# Patient Record
Sex: Male | Born: 1950
Health system: Southern US, Community
[De-identification: ages and names within clinical notes are randomized; demographics above are authoritative.]

## PROBLEM LIST (undated history)

## (undated) ENCOUNTER — Emergency Department (HOSPITAL_COMMUNITY): Admission: EM | Payer: Medicare HMO | Source: Home / Self Care

## (undated) DIAGNOSIS — R5381 Other malaise: Secondary | ICD-10-CM

## (undated) DIAGNOSIS — R269 Unspecified abnormalities of gait and mobility: Secondary | ICD-10-CM

## (undated) DIAGNOSIS — R5383 Other fatigue: Secondary | ICD-10-CM

## (undated) DIAGNOSIS — I5042 Chronic combined systolic (congestive) and diastolic (congestive) heart failure: Secondary | ICD-10-CM

## (undated) DIAGNOSIS — M5412 Radiculopathy, cervical region: Secondary | ICD-10-CM

## (undated) DIAGNOSIS — I471 Supraventricular tachycardia, unspecified: Secondary | ICD-10-CM

## (undated) DIAGNOSIS — N2581 Secondary hyperparathyroidism of renal origin: Secondary | ICD-10-CM

## (undated) DIAGNOSIS — G4730 Sleep apnea, unspecified: Secondary | ICD-10-CM

## (undated) DIAGNOSIS — E0590 Thyrotoxicosis, unspecified without thyrotoxic crisis or storm: Secondary | ICD-10-CM

## (undated) DIAGNOSIS — M79609 Pain in unspecified limb: Secondary | ICD-10-CM

## (undated) DIAGNOSIS — J984 Other disorders of lung: Secondary | ICD-10-CM

## (undated) DIAGNOSIS — Z86010 Personal history of colonic polyps: Secondary | ICD-10-CM

## (undated) DIAGNOSIS — I2510 Atherosclerotic heart disease of native coronary artery without angina pectoris: Secondary | ICD-10-CM

## (undated) DIAGNOSIS — K219 Gastro-esophageal reflux disease without esophagitis: Secondary | ICD-10-CM

## (undated) DIAGNOSIS — Z9289 Personal history of other medical treatment: Secondary | ICD-10-CM

## (undated) DIAGNOSIS — E042 Nontoxic multinodular goiter: Secondary | ICD-10-CM

## (undated) DIAGNOSIS — Z992 Dependence on renal dialysis: Secondary | ICD-10-CM

## (undated) DIAGNOSIS — N186 End stage renal disease: Secondary | ICD-10-CM

## (undated) DIAGNOSIS — I10 Essential (primary) hypertension: Secondary | ICD-10-CM

## (undated) DIAGNOSIS — F0390 Unspecified dementia without behavioral disturbance: Secondary | ICD-10-CM

## (undated) DIAGNOSIS — A088 Other specified intestinal infections: Secondary | ICD-10-CM

## (undated) DIAGNOSIS — M109 Gout, unspecified: Secondary | ICD-10-CM

## (undated) DIAGNOSIS — E785 Hyperlipidemia, unspecified: Secondary | ICD-10-CM

## (undated) DIAGNOSIS — R740 Nonspecific elevation of levels of transaminase and lactic acid dehydrogenase [LDH]: Secondary | ICD-10-CM

## (undated) DIAGNOSIS — B351 Tinea unguium: Secondary | ICD-10-CM

## (undated) DIAGNOSIS — N400 Enlarged prostate without lower urinary tract symptoms: Secondary | ICD-10-CM

## (undated) DIAGNOSIS — G609 Hereditary and idiopathic neuropathy, unspecified: Secondary | ICD-10-CM

## (undated) DIAGNOSIS — F329 Major depressive disorder, single episode, unspecified: Secondary | ICD-10-CM

## (undated) DIAGNOSIS — C61 Malignant neoplasm of prostate: Secondary | ICD-10-CM

## (undated) DIAGNOSIS — R079 Chest pain, unspecified: Secondary | ICD-10-CM

## (undated) DIAGNOSIS — E119 Type 2 diabetes mellitus without complications: Secondary | ICD-10-CM

## (undated) DIAGNOSIS — M48062 Spinal stenosis, lumbar region with neurogenic claudication: Secondary | ICD-10-CM

## (undated) DIAGNOSIS — I255 Ischemic cardiomyopathy: Secondary | ICD-10-CM

## (undated) DIAGNOSIS — G9340 Encephalopathy, unspecified: Secondary | ICD-10-CM

## (undated) DIAGNOSIS — N62 Hypertrophy of breast: Secondary | ICD-10-CM

## (undated) DIAGNOSIS — G4733 Obstructive sleep apnea (adult) (pediatric): Secondary | ICD-10-CM

## (undated) DIAGNOSIS — D649 Anemia, unspecified: Secondary | ICD-10-CM

## (undated) DIAGNOSIS — Z9989 Dependence on other enabling machines and devices: Secondary | ICD-10-CM

## (undated) DIAGNOSIS — J309 Allergic rhinitis, unspecified: Secondary | ICD-10-CM

## (undated) DIAGNOSIS — I959 Hypotension, unspecified: Secondary | ICD-10-CM

## (undated) HISTORY — DX: Dependence on other enabling machines and devices: Z99.89

## (undated) HISTORY — DX: Unspecified abnormalities of gait and mobility: R26.9

## (undated) HISTORY — DX: Major depressive disorder, single episode, unspecified: F32.9

## (undated) HISTORY — DX: Gastro-esophageal reflux disease without esophagitis: K21.9

## (undated) HISTORY — DX: Other fatigue: R53.83

## (undated) HISTORY — DX: Essential (primary) hypertension: I10

## (undated) HISTORY — DX: Other specified intestinal infections: A08.8

## (undated) HISTORY — DX: Benign prostatic hyperplasia without lower urinary tract symptoms: N40.0

## (undated) HISTORY — DX: Hypotension, unspecified: I95.9

## (undated) HISTORY — DX: Hereditary and idiopathic neuropathy, unspecified: G60.9

## (undated) HISTORY — DX: Tinea unguium: B35.1

## (undated) HISTORY — DX: Obstructive sleep apnea (adult) (pediatric): G47.33

## (undated) HISTORY — DX: Pain in unspecified limb: M79.609

## (undated) HISTORY — DX: Hypocalcemia: E83.51

## (undated) HISTORY — DX: Ischemic cardiomyopathy: I25.5

## (undated) HISTORY — DX: Nontoxic multinodular goiter: E04.2

## (undated) HISTORY — DX: Other malaise: R53.81

## (undated) HISTORY — DX: Radiculopathy, cervical region: M54.12

## (undated) HISTORY — DX: Hyperlipidemia, unspecified: E78.5

## (undated) HISTORY — DX: Hypertrophy of breast: N62

## (undated) HISTORY — DX: Allergic rhinitis, unspecified: J30.9

## (undated) HISTORY — DX: Type 2 diabetes mellitus without complications: E11.9

## (undated) HISTORY — DX: Gout, unspecified: M10.9

## (undated) HISTORY — PX: TONSILLECTOMY: SUR1361

## (undated) HISTORY — DX: Secondary hyperparathyroidism of renal origin: N25.81

## (undated) HISTORY — PX: CHOLECYSTECTOMY: SHX55

## (undated) HISTORY — DX: Nonspecific elevation of levels of transaminase and lactic acid dehydrogenase (ldh): R74.0

## (undated) HISTORY — DX: Atherosclerotic heart disease of native coronary artery without angina pectoris: I25.10

## (undated) HISTORY — DX: Anemia, unspecified: D64.9

## (undated) HISTORY — DX: Other disorders of lung: J98.4

## (undated) HISTORY — DX: Personal history of colonic polyps: Z86.010

## (undated) SURGERY — Surgical Case
Anesthesia: *Unknown

---

## 1996-10-01 HISTORY — PX: BACK SURGERY: SHX140

## 1997-12-30 ENCOUNTER — Other Ambulatory Visit: Admission: RE | Admit: 1997-12-30 | Discharge: 1997-12-30 | Payer: Self-pay | Admitting: Nephrology

## 1998-01-21 ENCOUNTER — Ambulatory Visit (HOSPITAL_COMMUNITY): Admission: RE | Admit: 1998-01-21 | Discharge: 1998-01-21 | Payer: Self-pay | Admitting: Neurosurgery

## 1998-03-22 ENCOUNTER — Ambulatory Visit (HOSPITAL_COMMUNITY): Admission: RE | Admit: 1998-03-22 | Discharge: 1998-03-22 | Payer: Self-pay | Admitting: Neurosurgery

## 1999-06-28 ENCOUNTER — Encounter: Payer: Self-pay | Admitting: Nephrology

## 1999-06-28 ENCOUNTER — Observation Stay (HOSPITAL_COMMUNITY): Admission: RE | Admit: 1999-06-28 | Discharge: 1999-06-29 | Payer: Self-pay | Admitting: Nephrology

## 1999-08-11 ENCOUNTER — Encounter: Payer: Self-pay | Admitting: Emergency Medicine

## 1999-08-11 ENCOUNTER — Emergency Department (HOSPITAL_COMMUNITY): Admission: EM | Admit: 1999-08-11 | Discharge: 1999-08-11 | Payer: Self-pay | Admitting: Emergency Medicine

## 2001-04-10 ENCOUNTER — Encounter: Admission: RE | Admit: 2001-04-10 | Discharge: 2001-04-10 | Payer: Self-pay | Admitting: Nephrology

## 2001-04-10 ENCOUNTER — Encounter: Payer: Self-pay | Admitting: Nephrology

## 2001-05-06 ENCOUNTER — Encounter: Admission: RE | Admit: 2001-05-06 | Discharge: 2001-05-06 | Payer: Self-pay | Admitting: Neurosurgery

## 2001-05-06 ENCOUNTER — Encounter: Payer: Self-pay | Admitting: Neurosurgery

## 2001-05-20 ENCOUNTER — Encounter: Payer: Self-pay | Admitting: Neurosurgery

## 2001-05-20 ENCOUNTER — Encounter: Admission: RE | Admit: 2001-05-20 | Discharge: 2001-05-20 | Payer: Self-pay | Admitting: Neurosurgery

## 2001-06-03 ENCOUNTER — Encounter: Payer: Self-pay | Admitting: Neurosurgery

## 2001-06-03 ENCOUNTER — Encounter: Admission: RE | Admit: 2001-06-03 | Discharge: 2001-06-03 | Payer: Self-pay | Admitting: Neurosurgery

## 2002-05-07 ENCOUNTER — Encounter: Payer: Self-pay | Admitting: Nephrology

## 2002-05-07 ENCOUNTER — Ambulatory Visit (HOSPITAL_COMMUNITY): Admission: RE | Admit: 2002-05-07 | Discharge: 2002-05-07 | Payer: Self-pay | Admitting: Nephrology

## 2002-08-11 ENCOUNTER — Encounter: Payer: Self-pay | Admitting: Neurosurgery

## 2002-08-11 ENCOUNTER — Encounter: Admission: RE | Admit: 2002-08-11 | Discharge: 2002-08-11 | Payer: Self-pay | Admitting: Neurosurgery

## 2002-08-25 ENCOUNTER — Encounter: Payer: Self-pay | Admitting: Neurosurgery

## 2002-08-25 ENCOUNTER — Encounter: Admission: RE | Admit: 2002-08-25 | Discharge: 2002-08-25 | Payer: Self-pay | Admitting: Neurosurgery

## 2002-09-08 ENCOUNTER — Encounter: Admission: RE | Admit: 2002-09-08 | Discharge: 2002-09-08 | Payer: Self-pay | Admitting: Neurosurgery

## 2002-09-08 ENCOUNTER — Encounter: Payer: Self-pay | Admitting: Neurosurgery

## 2002-10-02 ENCOUNTER — Encounter: Admission: RE | Admit: 2002-10-02 | Discharge: 2002-10-02 | Payer: Self-pay | Admitting: Neurosurgery

## 2002-10-02 ENCOUNTER — Encounter: Payer: Self-pay | Admitting: Neurosurgery

## 2002-11-11 ENCOUNTER — Ambulatory Visit (HOSPITAL_COMMUNITY): Admission: RE | Admit: 2002-11-11 | Discharge: 2002-11-11 | Payer: Self-pay | Admitting: Cardiology

## 2002-11-11 ENCOUNTER — Encounter: Payer: Self-pay | Admitting: Cardiology

## 2003-05-13 ENCOUNTER — Encounter: Admission: RE | Admit: 2003-05-13 | Discharge: 2003-05-13 | Payer: Self-pay | Admitting: Neurosurgery

## 2003-05-13 ENCOUNTER — Encounter: Payer: Self-pay | Admitting: Neurosurgery

## 2003-09-01 HISTORY — PX: FOREIGN BODY REMOVAL: SHX962

## 2003-09-03 ENCOUNTER — Emergency Department (HOSPITAL_COMMUNITY): Admission: EM | Admit: 2003-09-03 | Discharge: 2003-09-04 | Payer: Self-pay

## 2003-10-15 ENCOUNTER — Ambulatory Visit (HOSPITAL_COMMUNITY): Admission: RE | Admit: 2003-10-15 | Discharge: 2003-10-15 | Payer: Self-pay | Admitting: Gastroenterology

## 2003-10-15 ENCOUNTER — Encounter (INDEPENDENT_AMBULATORY_CARE_PROVIDER_SITE_OTHER): Payer: Self-pay | Admitting: *Deleted

## 2004-02-22 ENCOUNTER — Emergency Department (HOSPITAL_COMMUNITY): Admission: EM | Admit: 2004-02-22 | Discharge: 2004-02-22 | Payer: Self-pay | Admitting: Emergency Medicine

## 2004-03-03 IMAGING — CT CT RECONSTRUCTION
1 of 9 series · 6 of 33 positions shown, 8 images · IV contrast (omnipaque)
Comparison: none

FINDINGS
CLINICAL DATA: BACK PAIN.  THE PATIENT IS REFERRED FOR A LUMBAR MYELOGRAM.
LUMBAR MYELOGRAM
FOLLOWING INFORMED CONSENT, USING STERILE TECHNIQUE AND LIDOCAINE FOR LOCAL ANESTHESIA, I WAS ABLE
TO DIRECT A 22 GAUGE SPINAL NEEDLE INTO THE THECAL SAC CENTRALLY AT THE L2 LEVEL.  CSF IS CLEAR.
15 CC OMNIPAQUE 180 CONTRAST WAS INJECTED.  THE NEEDLE WAS REMOVED.  MULTIPLE IMAGES WERE OBTAINED
AND SUPPLEMENTED WITH STANDING FLEXION AND EXTENSION VIEWS.
THERE IS CLUMPING OF THE NERVE ROOTS AT THE L1 LEVEL WITH MARKED STENOSIS AT L1-2.  THERE IS
MINIMAL CONTRAST WHICH EXTENDS CAUDAL TO THE L1-2 LEVEL.  WITH SUBSEQUENT MANIPULATION, WE WERE
ABLE TO EXTEND THE CONTRAST CAUDALLY TO THE L5-S1 LEVEL.  MINIMAL CONTRAST, HOWEVER, IS SEEN AT THE
L2-3, L3-4, AND L4-5 LEVELS.  THREADED INTERBODY CAGES ARE NOTED AT L3-4.  PEDICLE SCREW FIXATION
OF L4-5 IS NOTED.  THIS IS STABLE THROUGH FLEXION AND EXTENSION.
IMPRESSION
SEVERE SPINAL STENOSIS AT THE L1-2 LEVEL.  THE FUSION IS STABLE THROUGH FLEXION AND EXTENSION.  CT
TO FOLLOW.
CT LUMBAR SPINE POST INTRATHECAL CONTRAST INJECTION
AXIAL IMAGING WAS DIRECTED THROUGH THE DISK LEVELS L1-2 INFERIORLY TO L5-S1.
THERE IS MINIMAL INTRATHECAL CONTRAST VISUALIZED.  CONTRAST DOES EXTEND CAUDAL VISUALIZED AT THE L4-
5 AND L5-S1 LEVELS.
L1-2:  THERE IS A CENTRAL VACUUM DISK WITH DIFFUSE CIRCUMFERENTIAL DISK BULGE.  THIS IN CONJUNCTION
WITH FACET HYPERTROPHY AND CONGENITALLY SHORT PEDICLES RESULTS IN SEVERE CENTRAL STENOSIS.  THE
STENOSIS IS FURTHER COMPOUNDED BY MARKED EPIDURAL FAT VENTRALLY.  THIS IS SEEN ON THE OUTSIDE MR
WITH A TREFOIL CONFIGURATION OF THE THECAL SAC WITHIN THE UPPER LUMBAR SPINE.
L2-3:  VACUUM DISK WITH DIFFUSE CIRCUMFERENTIAL DISK BULGE.  EXTENSIVE VENTRAL EPIDURAL FAT IS
IDENTIFIED RESULTING IN MARKED MASS EFFECT UPON THE THECAL SAC.   ASSOCIATED FACET OSTEOARTHRITIC
CHANGES ARE SEEN BILATERALLY.
L3-4:  THREADED INTERBODY CAGES ARE WELL-POSITIONED.  THERE IS EXTENSIVE STREAK ARTIFACT, HOWEVER,
REMAINS DIAGNOSTIC.  POSTERIOR LAMINECTOMY DEFECTS ARE NOTED WHICH DECOMPRESS THE CENTRAL CANAL.
NO FORAMINAL ENCROACHMENT IS IDENTIFIED.
L4-5:  THE PEDICLE SCREWS ARE WELL-POSITIONED.  THERE IS NO OSSEOUS FORAMINAL ENCROACHMENT.
POSTERIORLY, THE CENTRAL CANAL IS DECOMPRESSED.  NO APPRECIABLE CENTRAL STENOSIS.
L5-S1:  THE PEDICLE SCREWS ARE WELL-POSITIONED.  DIFFUSE CIRCUMFERENTIAL DISK BULGE.  THERE IS
MULTIFACTORIAL FORAMINAL NARROWING BILATERALLY ASSOCIATED WITH A DISK BULGE COMPOUNDED BY SUPERIOR
FACET BONY OVERGROWTH.  THE RIGHT L5 DORSAL ROOT GANGLIA EXITS WITHOUT ENCROACHMENT.  THERE IS
OBSCURATION OF THE FAT ABOUT THE LEFT L5 DORSAL ROOT GANGLION.  ADDITIONALLY, THERE IS SEVERE LEFT
SUBARTICULAR LATERAL RECESS STENOSIS SECONDARY TO SUPERIOR FACET BONY OVERGROWTH.
1)   THE PATIENT HAS SEVERE CENTRAL STENOSIS AT THE L1-2, L2-3 LEVELS.  THIS IS MULTIFACTORIAL
ASSOCIATED WITH EXTENSIVE VENTRAL EPIDURAL FAT WHICH IS COMPOUNDED BY DIFFUSE CIRCUMFERENTIAL DISK
BULGE AND FACET HYPERTROPHY IN A SETTING OF CONGENITALLY SHORTENED PEDICLES.
2)   PEDICLE SCREWS AND INTERBODY CAGES ARE WELL-POSITIONED.
3)    FORAMINAL NARROWING IS NOTED ON THE LEFT AT L5-S1 MULTIFACTORIAL IN ETIOLOGY WITH ASSOCIATED
SEVERE LEFT SUBARTICULAR LATERAL RECESS STENOSIS.
CT MULTIPLANAR REFORMATTED IMAGING
FUSION AT L3-4, L4-5 INTACT.  PEDICLE SCREWS ARE WELL-POSITIONED.  SEVERE CENTRAL STENOSIS IS
APPRECIATED AT L1-2 WHICH IS MULTIFACTORIAL IN ETIOLOGY.

[Series 102: l-spine helical · axial · 0.27mm/px · z∈[-347,-190]mm · 6 of 368 slices shown, 8 images]
[im 53/368  soft-tissue]
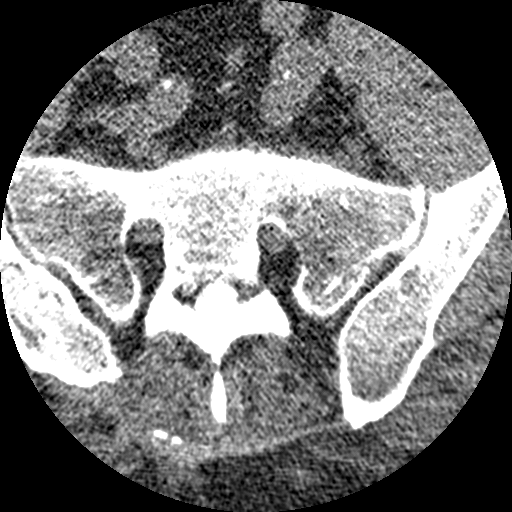
[im 53/368  bone]
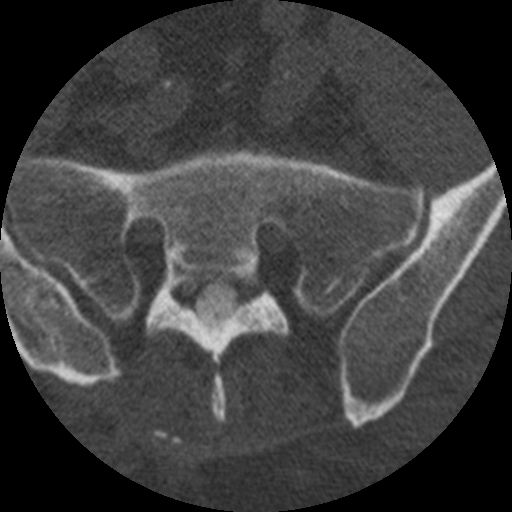
[im 105/368  bone]
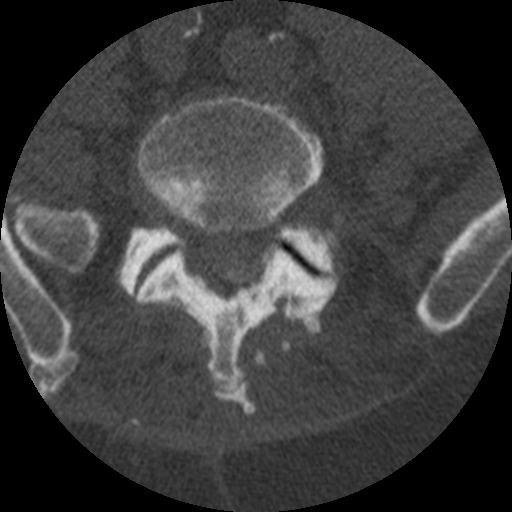
[im 158/368  bone]
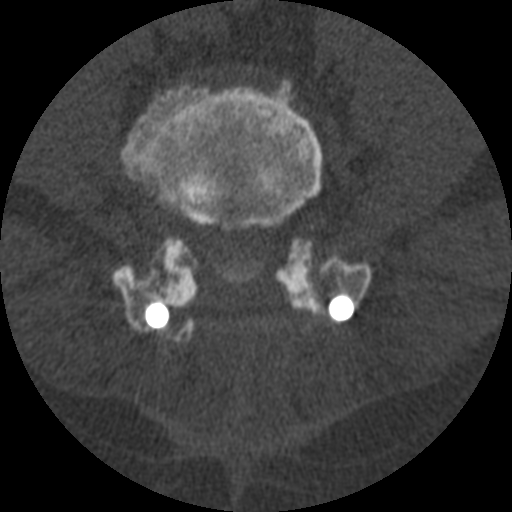
[im 210/368  bone]
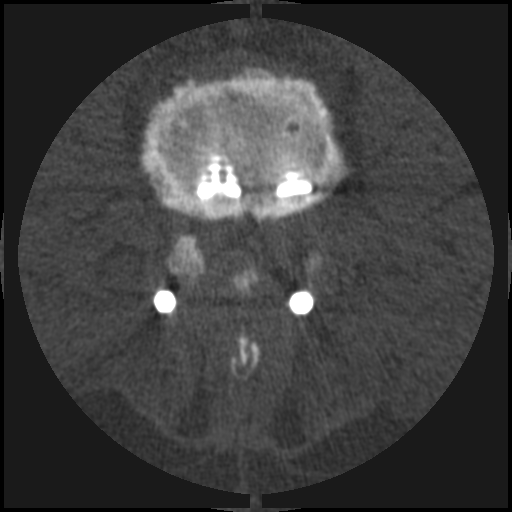
[im 263/368  soft-tissue]
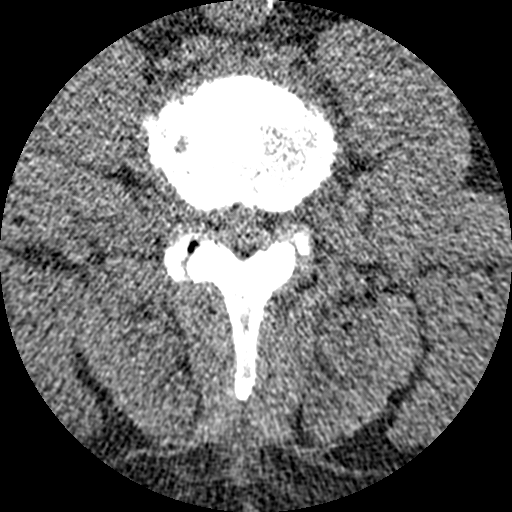
[im 263/368  bone]
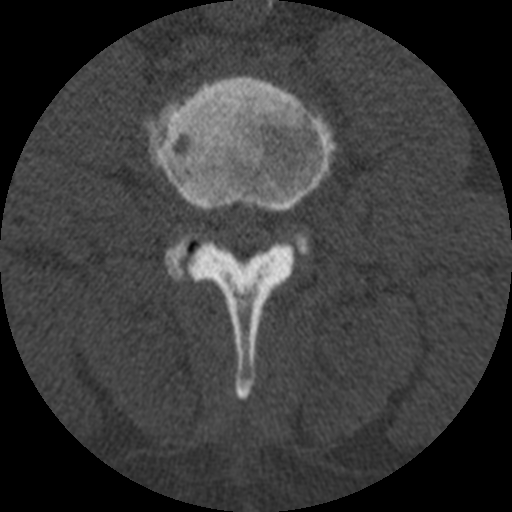
[im 315/368  bone]
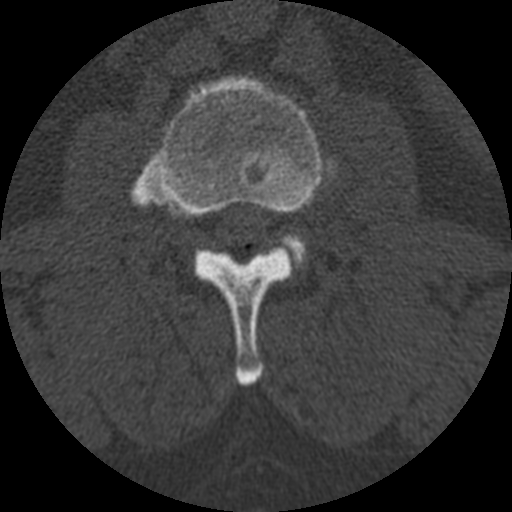

[6 of 33 positions shown; findings below may reference images not displayed]

## 2004-04-05 ENCOUNTER — Encounter: Admission: RE | Admit: 2004-04-05 | Discharge: 2004-04-05 | Payer: Self-pay | Admitting: Neurosurgery

## 2004-12-13 IMAGING — CR DG LUMBAR SPINE COMPLETE 4+V
5 series · 5 of 5 positions shown · non-contrast
Comparison: none

CLINICAL DATA: Pain in the right side.  Back surgery five years ago. 
 FOUR VIEW LUMBAR SPINE ? 02/22/2004 
 Compared to a report from a prior lumbar spine set of radiographs from [REDACTED] dated 10/02/2002.

[view not recorded (1 of 5)]
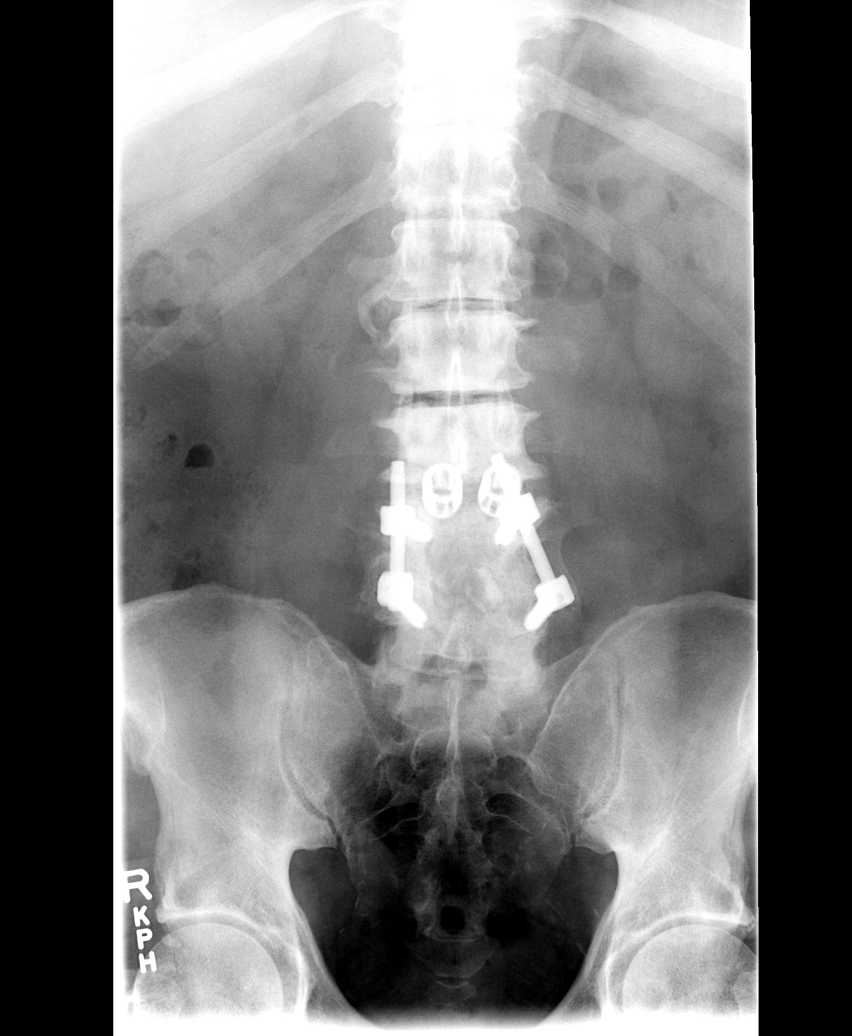

[view not recorded (2 of 5)]
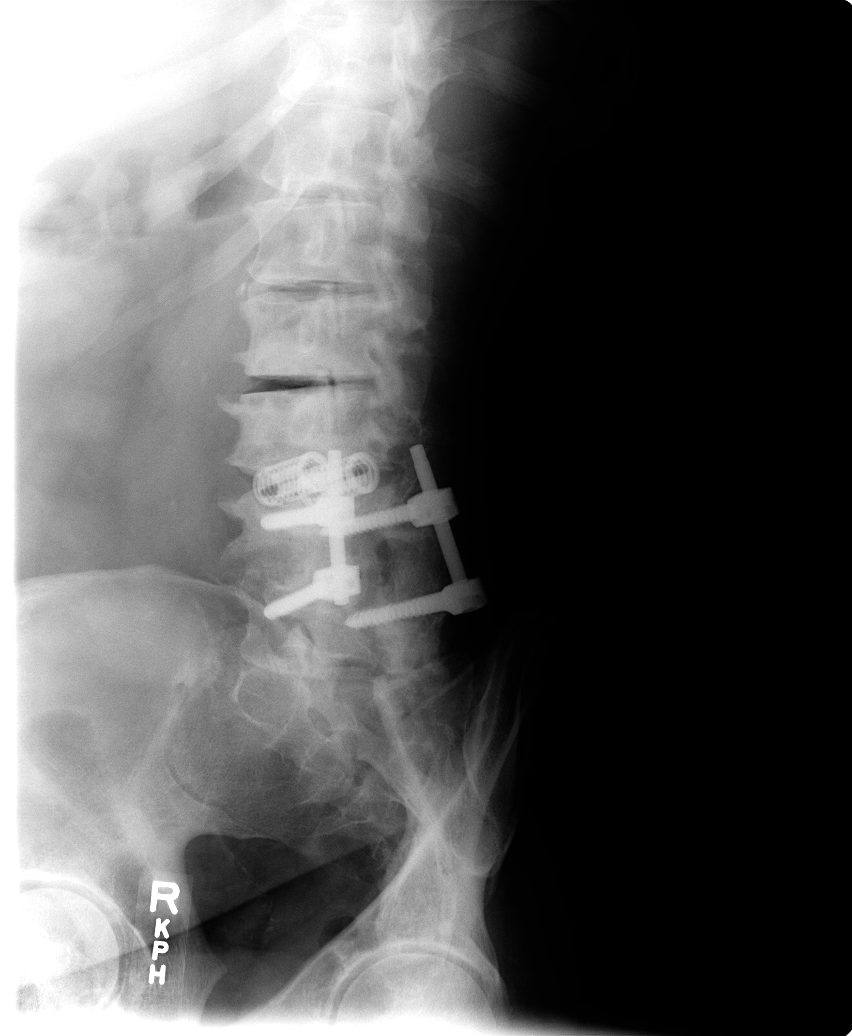

[view not recorded (3 of 5)]
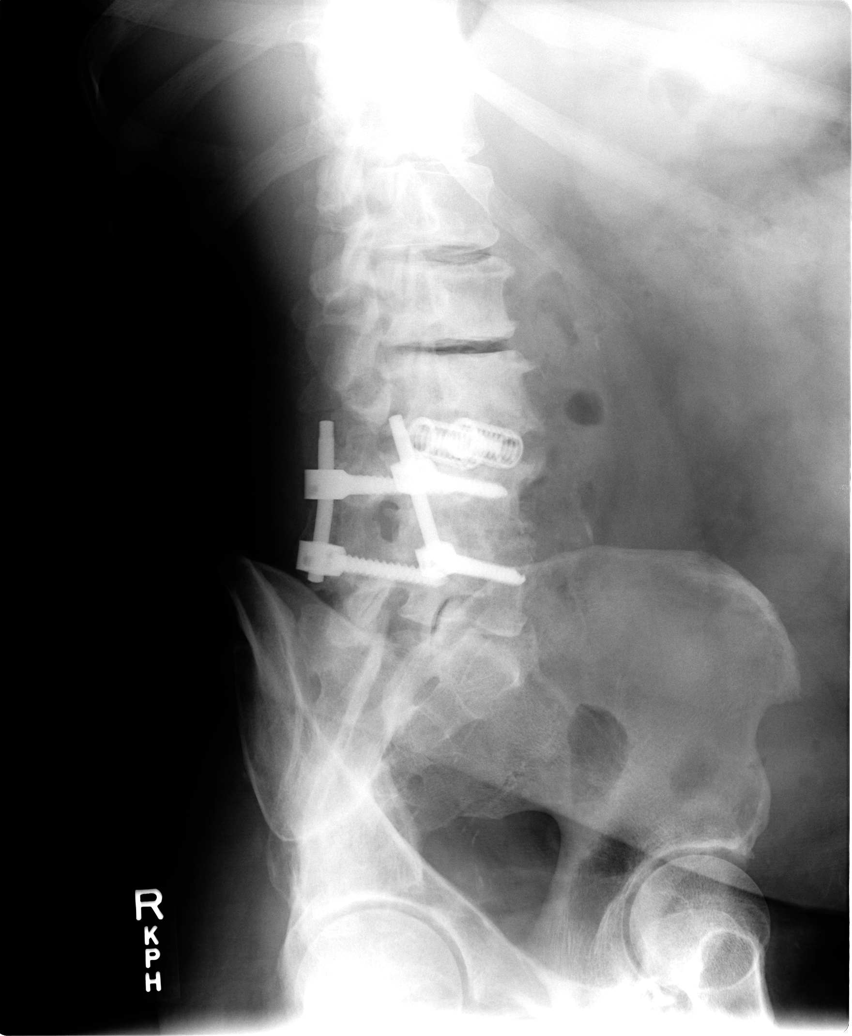

[view not recorded (4 of 5)]
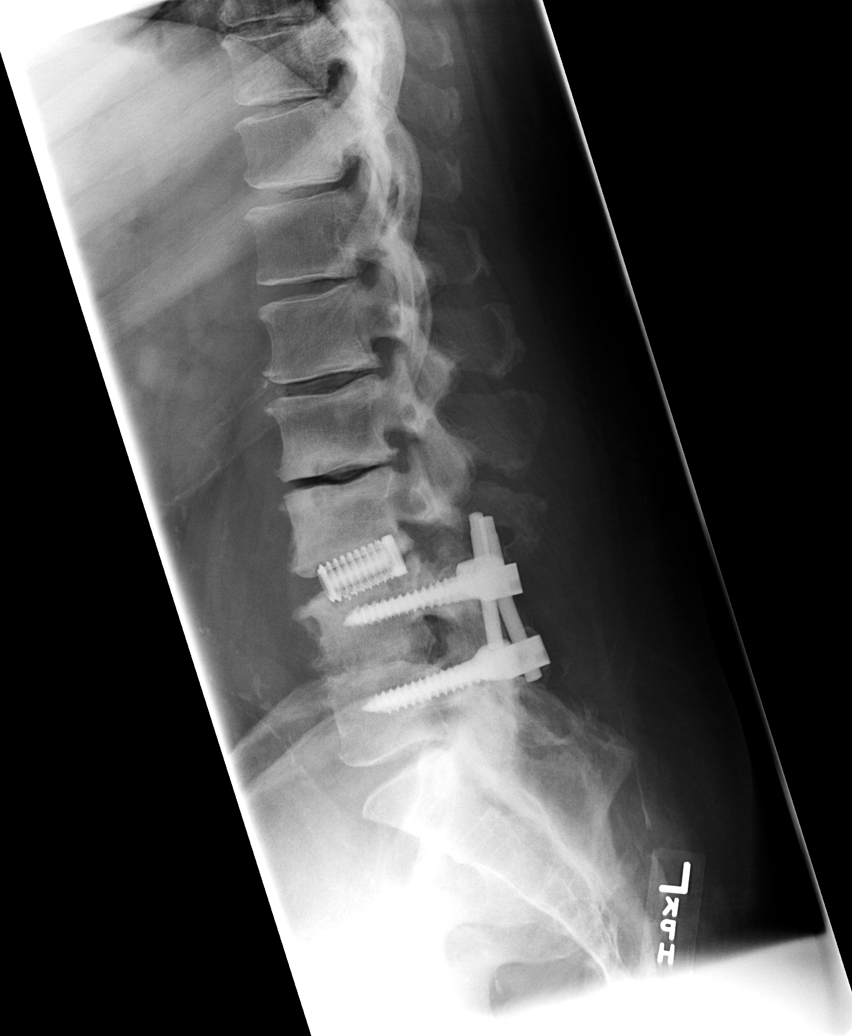

[view not recorded (5 of 5)]
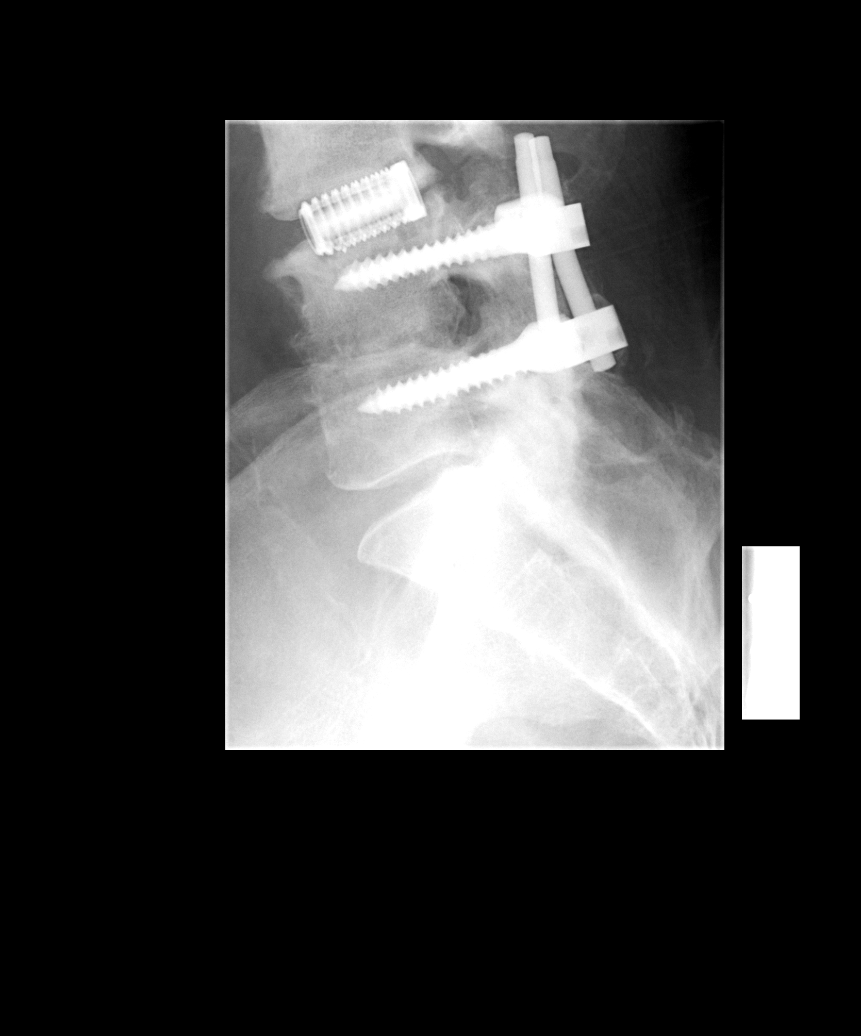

[5 of 5 positions shown; findings below may reference images not displayed]

FINDINGS: There are five lumbar type vertebrae. There is pedicle screw and posterolateral rod fixation at L4-5 along with ray cages at L3-4.  There is loss of intervertebral disk height at L1-2 and L2-3 with vacuum disk phenomenon.  There is bony interbody fusion at L4-5.  
 There is a prominent osteophyte formation posterior to the spinal column at all levels between L1 and L5.  This certainly could be contributing to central stenosis.  The patient has had a laminectomy at L4.  No definite malalignment and no sign of fracture.  There is sclerosis in the vertebral bodies from L2 through L5 believed to be degenerative and diskogenic in nature.  
 IMPRESSION
 Prominent spondylosis with postoperative findings as noted above.  
 Nor fracture or subluxation is evident.

## 2005-01-25 IMAGING — CR DG LUMBAR SPINE 2-3V
4 series · 4 of 4 positions shown · non-contrast
Comparison: none

CLINICAL DATA: Back pain, prior lumbar surgery, follow up.
 LUMBAR SPINE 
 Lateral views of the lumbar spine were obtained in neutral, flexion, and extension, and compared to prior films of 02/22/04.  Transpedicular and interbody fusion at L4-5 appears stable.  Ray cage fusion at L3-4 is unchanged with some subsidence of the ray cage into the superior end plate of L4 and into the inferior end plate of L3.  Degenerative disk disease at L1-2 and L2-3 is again noted.  There does appear to have been slight further decrease in disk space of L1-2 and L2-3 compared to 02/22/04.  Through flexion and extension, there is limited range of  motion but no malalignment is seen.
 IMPRESSION
 1.  Stable transpedicular and interbody fusion at L4-5 with no change in appearance of ray cage fusion at L3-4.
 2.  Further loss of disk space at L2-3 where there is degenerative disk disease present as well as L1-2 level.
 3.  Limited range of  motion through flexion and extension.  No malalignment.

[view not recorded (1 of 4)]
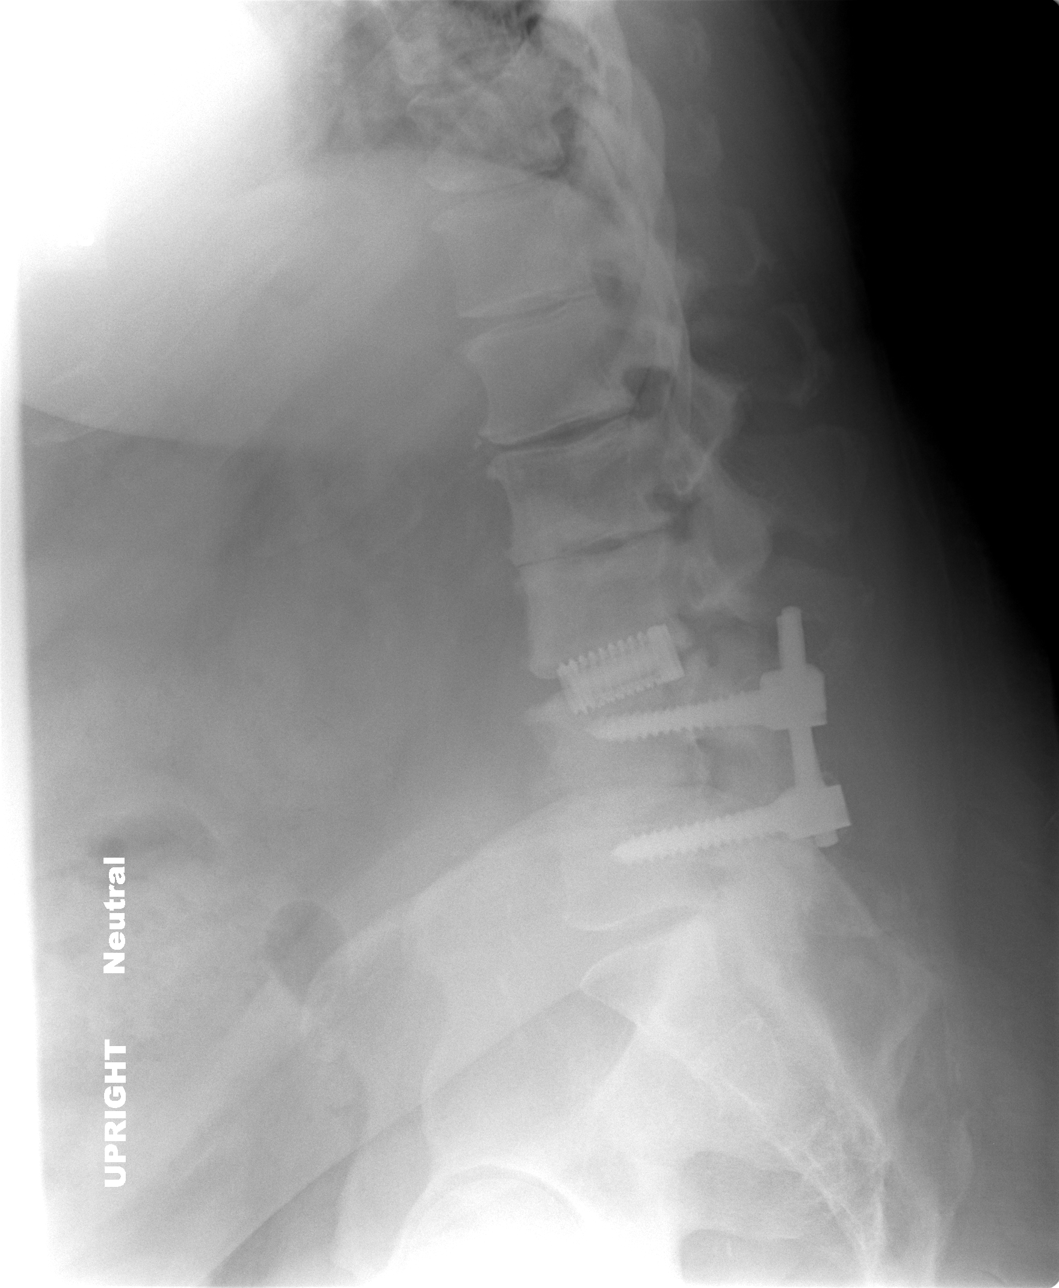

[view not recorded (2 of 4)]
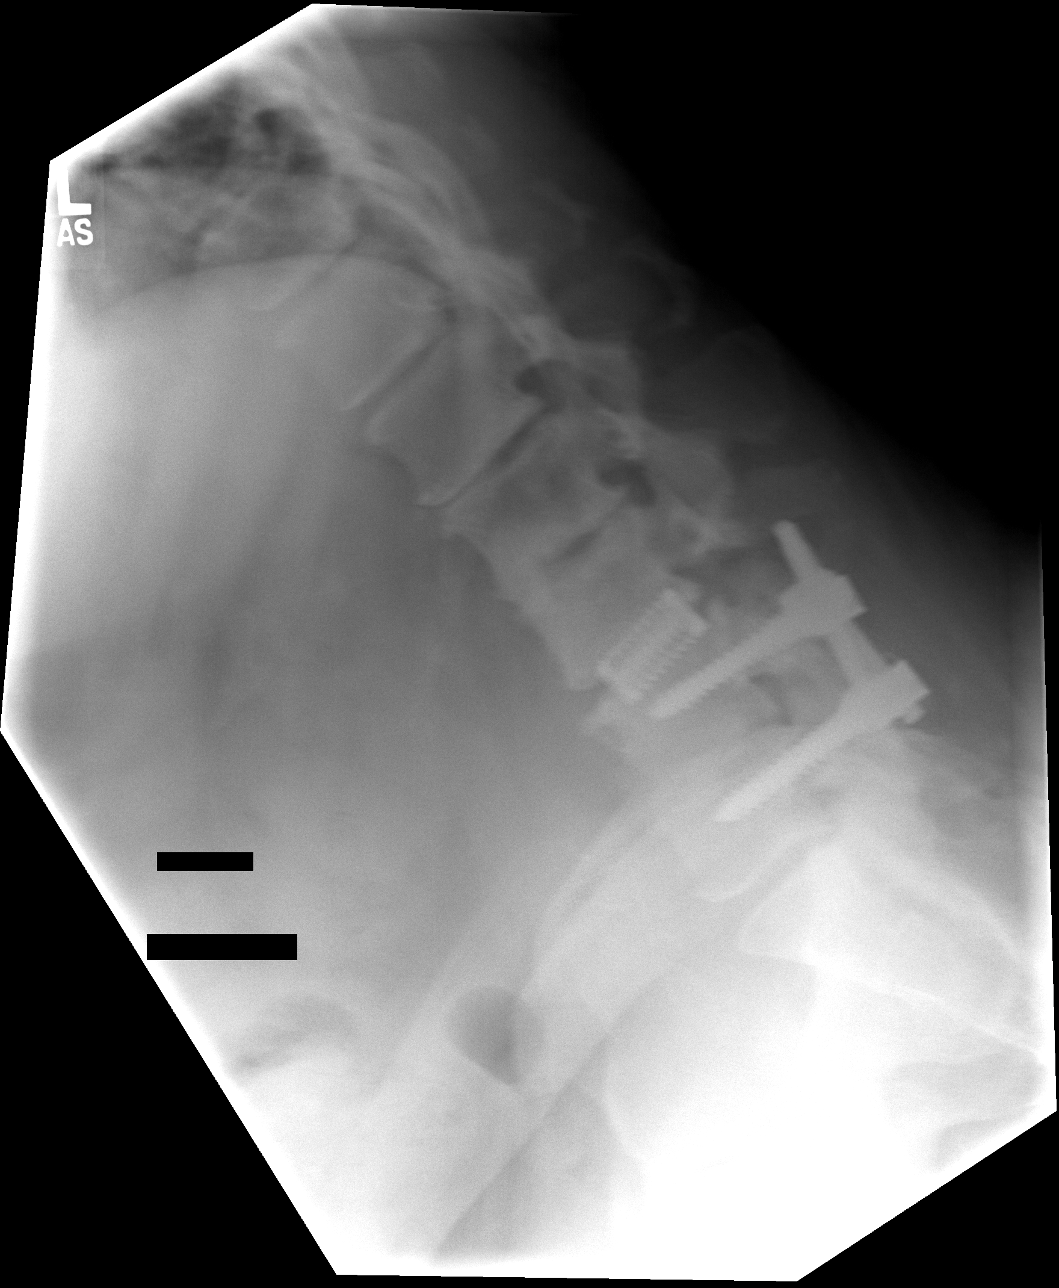

[view not recorded (3 of 4)]
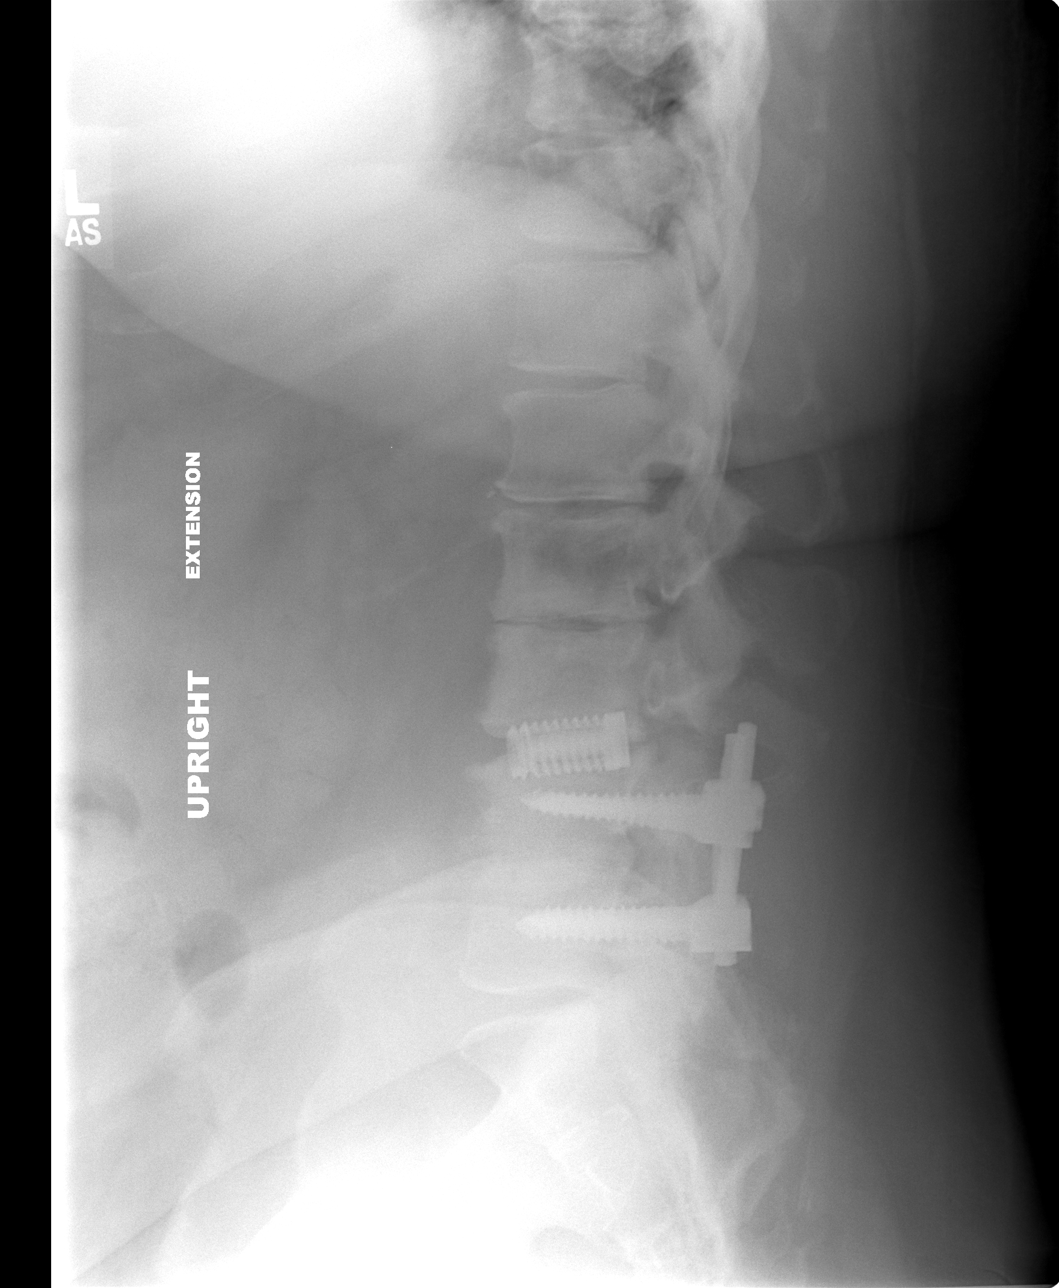

[view not recorded (4 of 4)]
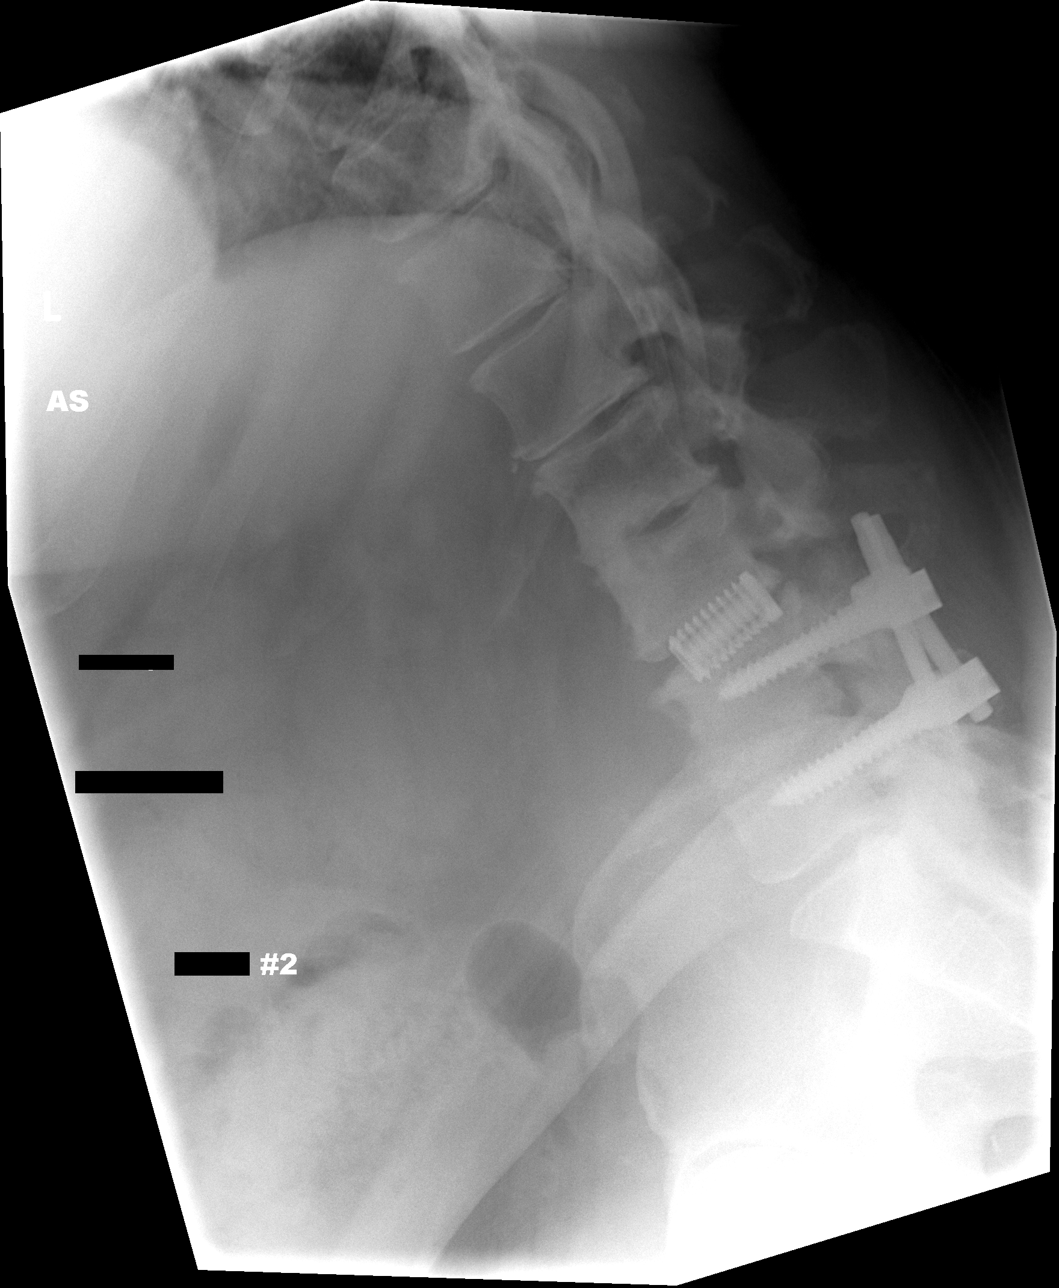

[4 of 4 positions shown; findings below may reference images not displayed]

## 2006-02-18 ENCOUNTER — Encounter: Admission: RE | Admit: 2006-02-18 | Discharge: 2006-02-18 | Payer: Self-pay | Admitting: Nephrology

## 2006-02-21 ENCOUNTER — Encounter
Admission: RE | Admit: 2006-02-21 | Discharge: 2006-02-21 | Payer: Self-pay | Admitting: Physical Medicine and Rehabilitation

## 2006-03-01 ENCOUNTER — Encounter: Admission: RE | Admit: 2006-03-01 | Discharge: 2006-03-01 | Payer: Self-pay | Admitting: Nephrology

## 2006-03-05 ENCOUNTER — Ambulatory Visit: Payer: Self-pay | Admitting: Pulmonary Disease

## 2006-03-05 ENCOUNTER — Inpatient Hospital Stay (HOSPITAL_COMMUNITY): Admission: AD | Admit: 2006-03-05 | Discharge: 2006-03-20 | Payer: Self-pay | Admitting: Nephrology

## 2006-03-08 ENCOUNTER — Ambulatory Visit: Payer: Self-pay | Admitting: Cardiology

## 2006-03-08 ENCOUNTER — Encounter: Payer: Self-pay | Admitting: Cardiology

## 2006-04-24 ENCOUNTER — Ambulatory Visit (HOSPITAL_BASED_OUTPATIENT_CLINIC_OR_DEPARTMENT_OTHER): Admission: RE | Admit: 2006-04-24 | Discharge: 2006-04-24 | Payer: Self-pay | Admitting: Nephrology

## 2006-04-29 ENCOUNTER — Inpatient Hospital Stay (HOSPITAL_COMMUNITY): Admission: AD | Admit: 2006-04-29 | Discharge: 2006-05-07 | Payer: Self-pay | Admitting: Nephrology

## 2006-05-04 ENCOUNTER — Ambulatory Visit: Payer: Self-pay | Admitting: Internal Medicine

## 2006-06-23 ENCOUNTER — Emergency Department (HOSPITAL_COMMUNITY): Admission: EM | Admit: 2006-06-23 | Discharge: 2006-06-23 | Payer: Self-pay | Admitting: Family Medicine

## 2006-12-10 IMAGING — CR DG ANKLE COMPLETE 3+V*L*
3 series · 3 of 3 positions shown · non-contrast
Comparison: none

CLINICAL DATA: Pain and swelling of left foot and ankle x 4 days.  No injury.
 LEFT FOOT ? 3 VIEW:

[t ankle joint lat left *]
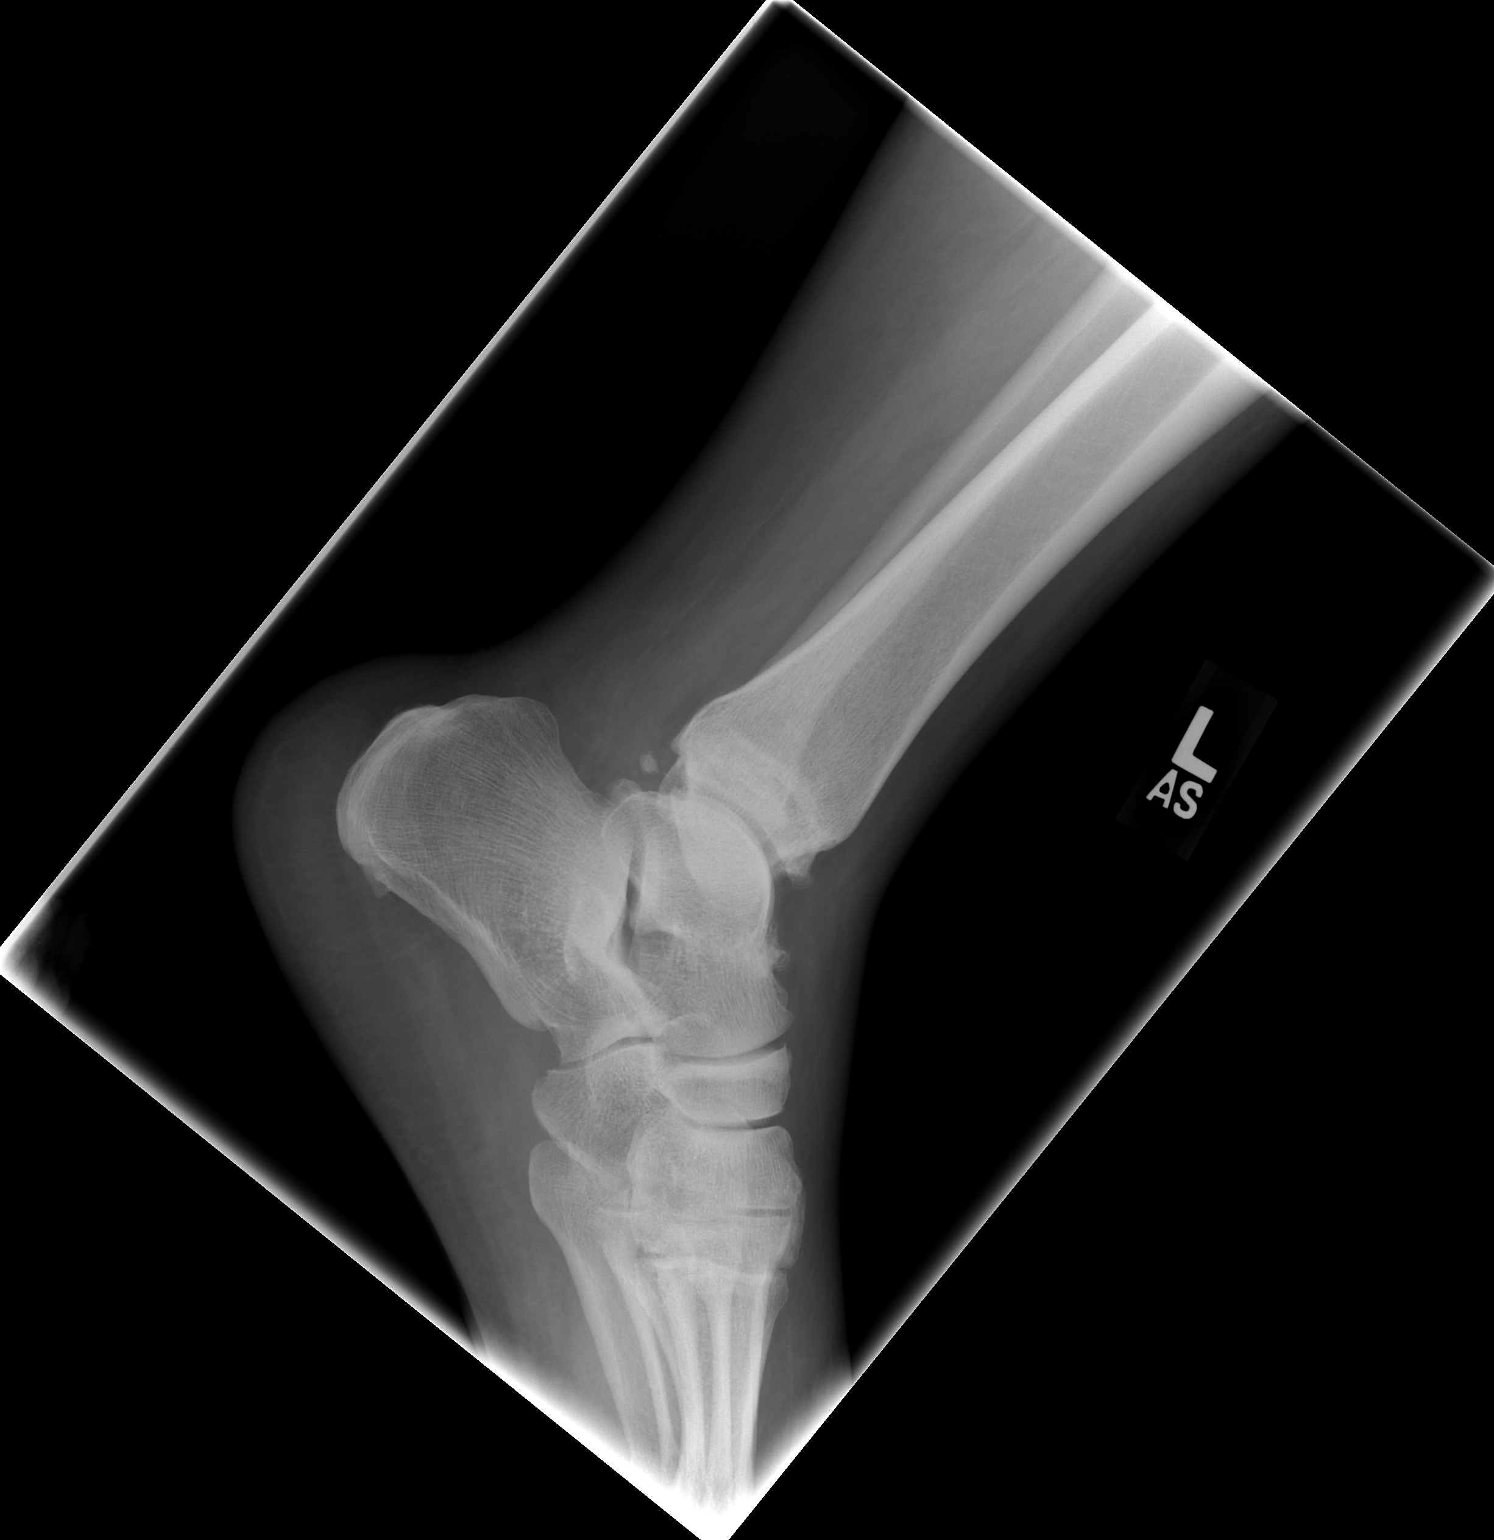

[t ankle joint ap left *]
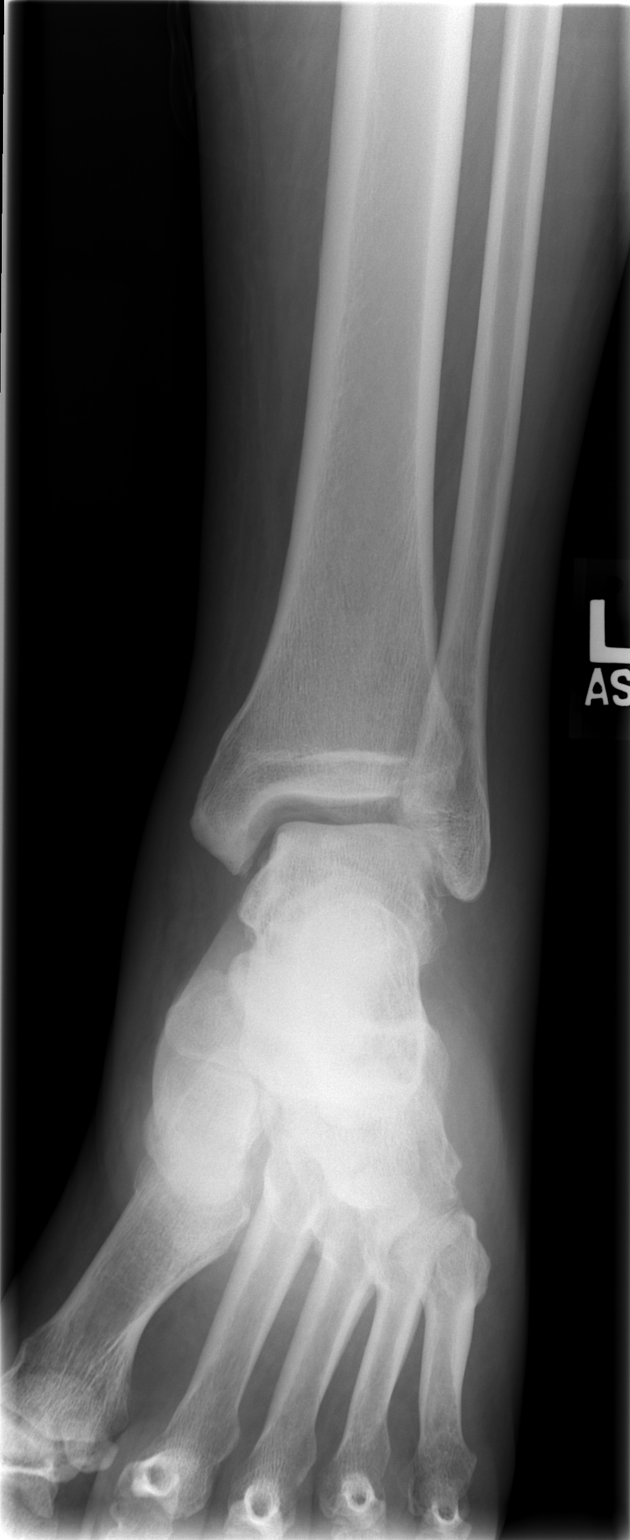

[t ankle joint oblique left *]
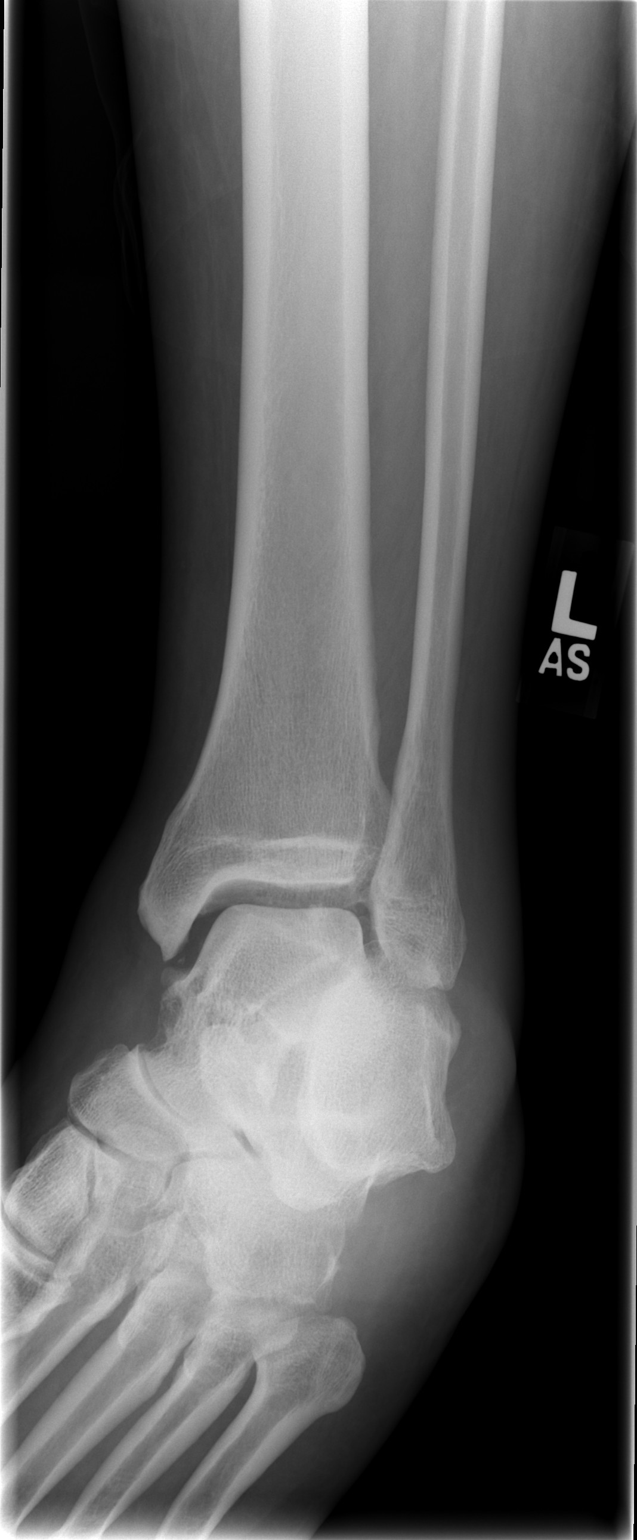

[3 of 3 positions shown; findings below may reference images not displayed]

FINDINGS: There is diffuse soft tissue swelling.  There is no evidence for fracture and there are no erosive or destructive changes.
IMPRESSION: Diffuse soft tissue swelling.  Otherwise negative study. 
 LEFT ANKLE ? 3 VIEW:
FINDINGS: There is diffuse soft tissue swelling.  There are no fractures or dislocations and there are no erosive or destructive changes.  There are mild degenerative changes associated with the left tibiotalar joint.
IMPRESSION: Mild degenerative changes associated with the tibiotalar joint.  Otherwise negative views of the left ankle.

## 2006-12-10 IMAGING — CR DG FOOT COMPLETE 3+V*L*
3 series · 3 of 3 positions shown · non-contrast
Comparison: none

CLINICAL DATA: Pain and swelling of left foot and ankle x 4 days.  No injury.
 LEFT FOOT ? 3 VIEW:

[t foot ap left]
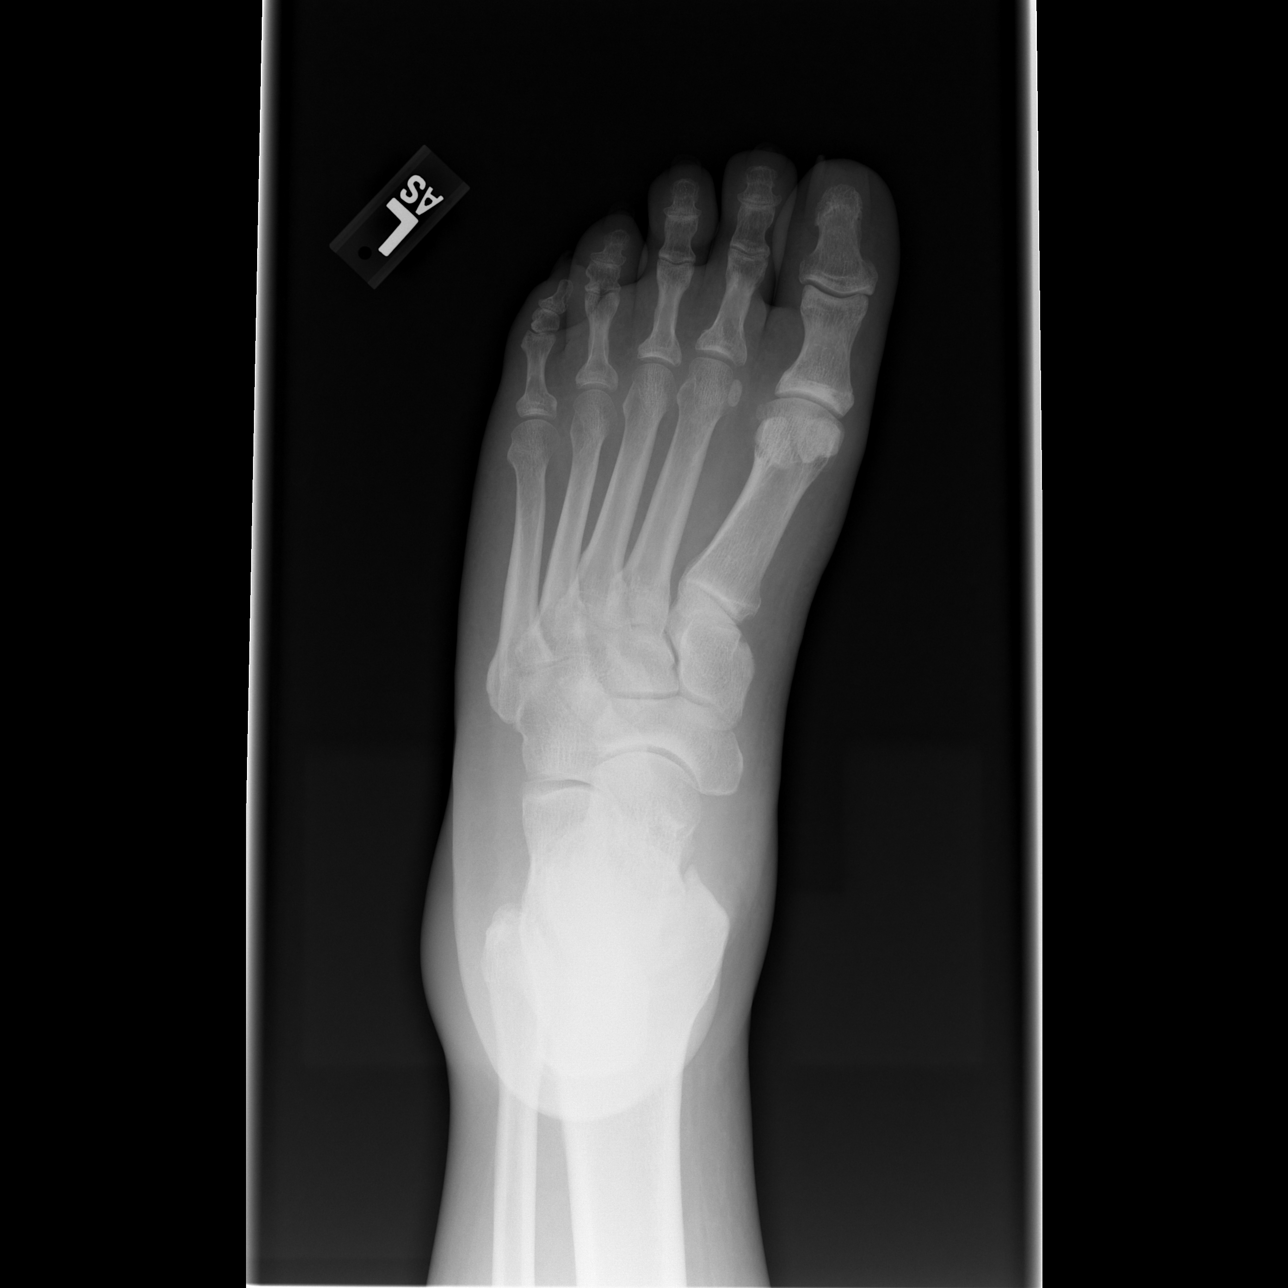

[t foot oblique left *]
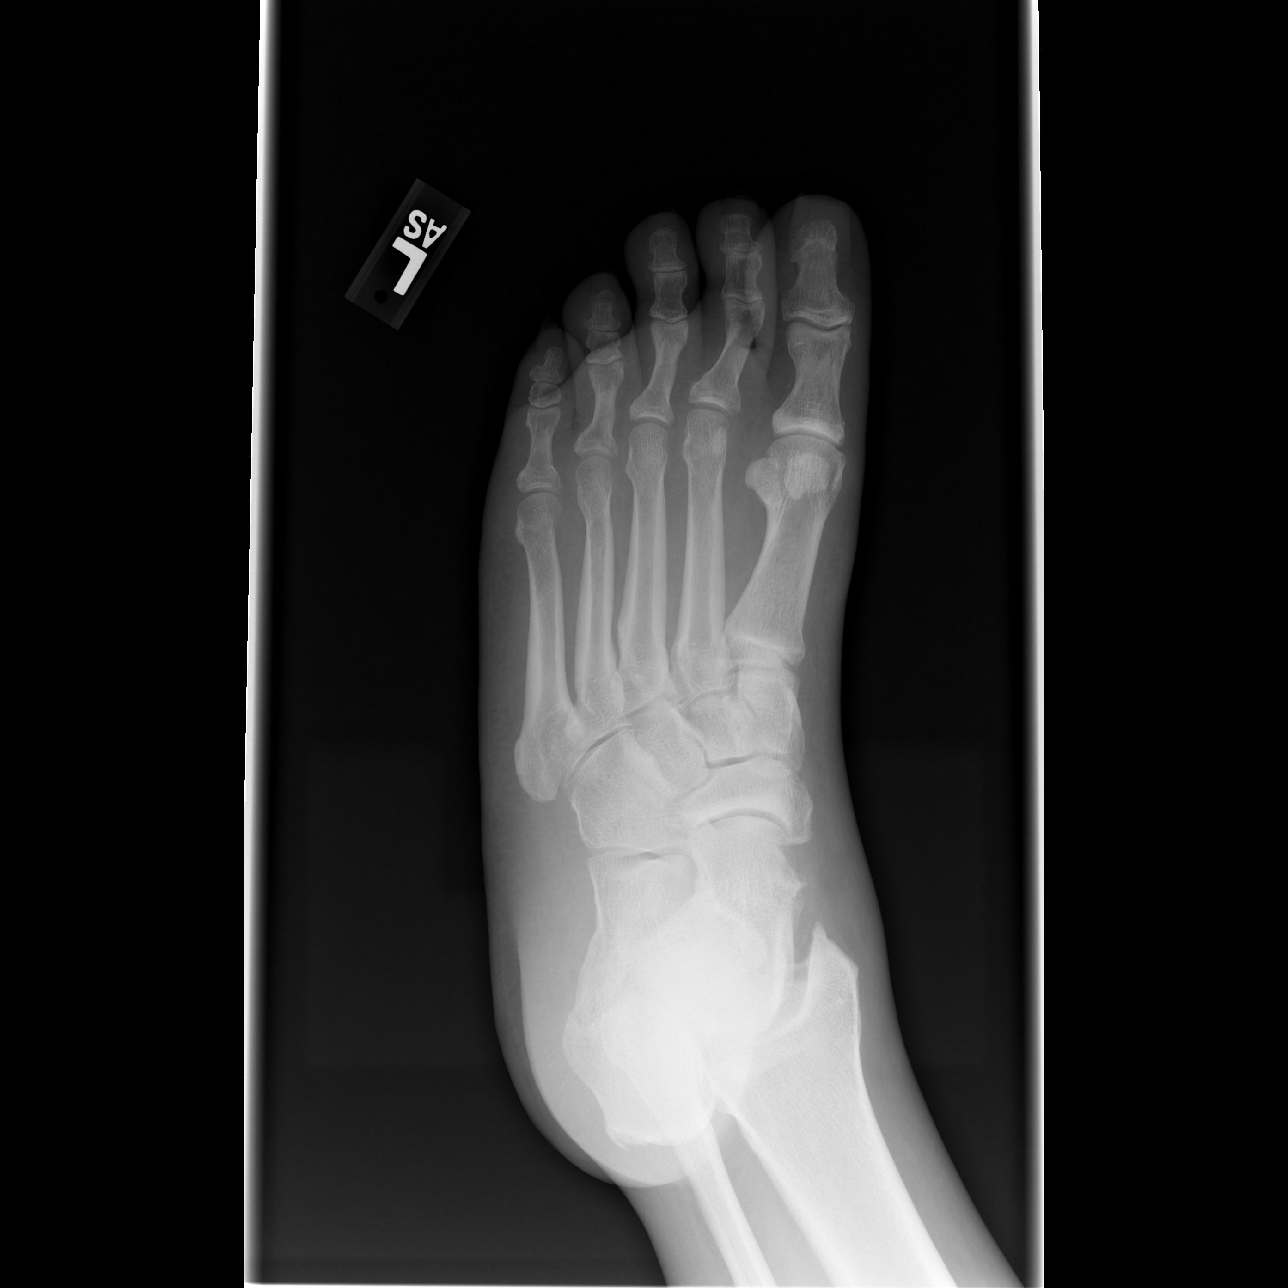

[t foot lat left]
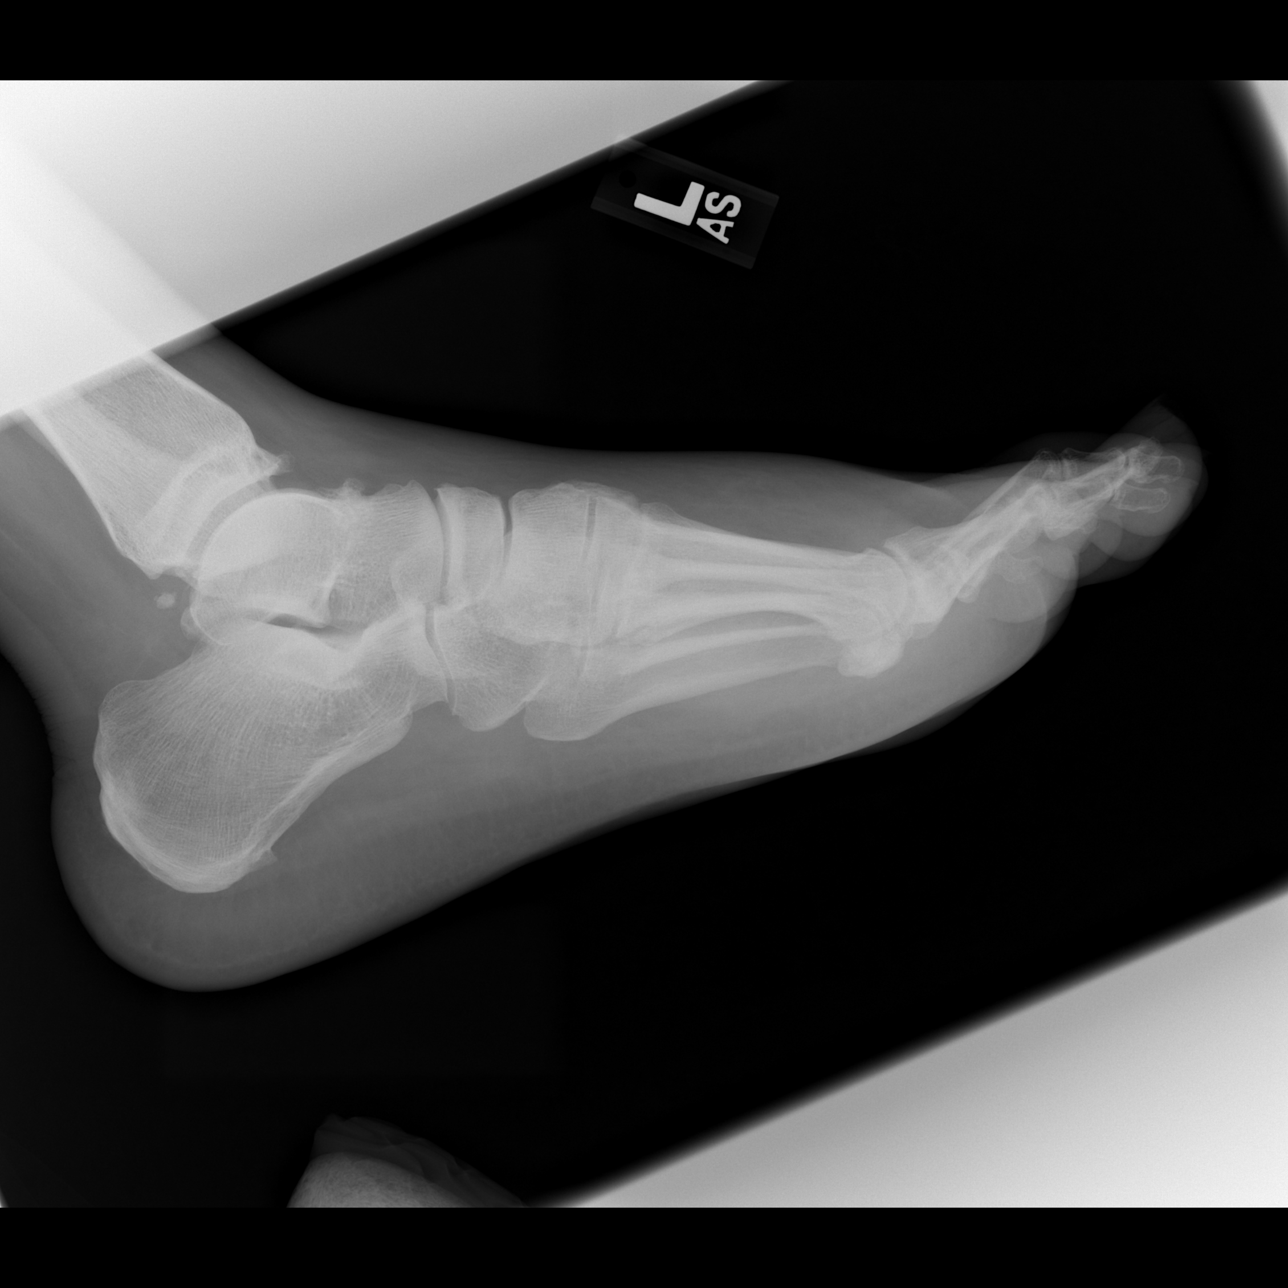

[3 of 3 positions shown; findings below may reference images not displayed]

FINDINGS: There is diffuse soft tissue swelling.  There is no evidence for fracture and there are no erosive or destructive changes.
IMPRESSION: Diffuse soft tissue swelling.  Otherwise negative study. 
 LEFT ANKLE ? 3 VIEW:
FINDINGS: There is diffuse soft tissue swelling.  There are no fractures or dislocations and there are no erosive or destructive changes.  There are mild degenerative changes associated with the left tibiotalar joint.
IMPRESSION: Mild degenerative changes associated with the tibiotalar joint.  Otherwise negative views of the left ankle.

## 2006-12-13 IMAGING — RF DG MYELOGRAM LUMBAR
13 of 23 series · 13 of 23 positions shown · IV contrast (omnipaque)
Comparison: CT myelogram 05/13/03.

CLINICAL DATA: Severe back pain and bilateral leg pain, left worse than right.  History of diabetes.  
LUMBAR MYELOGRAM:
TECHNIQUE: The patient was given informed consent including the risks of bleeding, infection (increased in diabetics), neurological deficit and postmyelogram headache.  The patient agreed to proceed.  Initially I chose a puncture site at L5-S1.  I could not enter the thecal sac at this level.  Next I chose a lumbar puncture site at L2-3.  There was no return of CSF and I got a mixed injection despite good placement of the needle within the spinal canal.  Finally I chose an 18 gauge needle and approached the thecal sac in the midline at L-4.  There was good return of clear and colorless CSF.  I injected 15 cc of Omnipaque 180 into the subarachnoid space.
TECHNIQUE: Multidetector CT imaging of the lumbar spine was performed after intrathecal injection of contrast.  Multiplanar CT image reconstructions were also generated.

[Series 1: myelogram  white · 1 of 1 slices shown (1 of 13)]
[im 1/1]
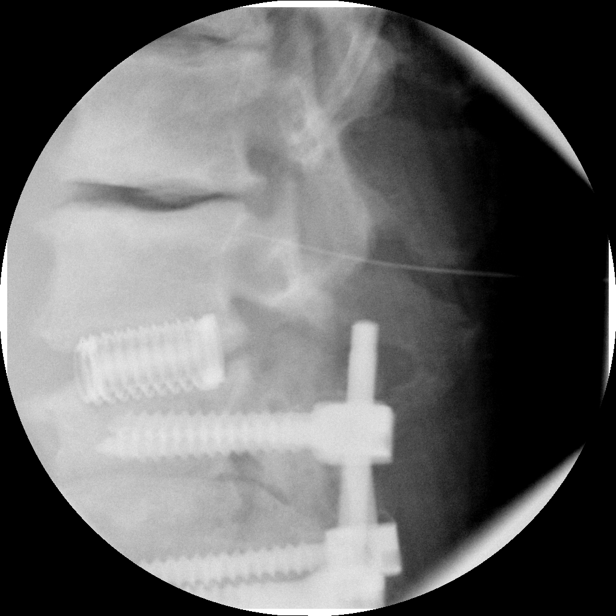

[Series 3: myelogram  white · 1 of 1 slices shown (2 of 13)]
[im 1/1]
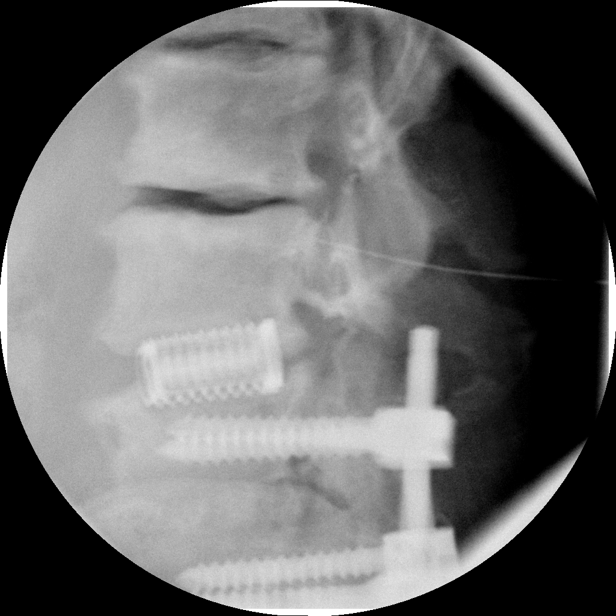

[Series 5: myelogram  white · 1 of 1 slices shown (3 of 13)]
[im 1/1]
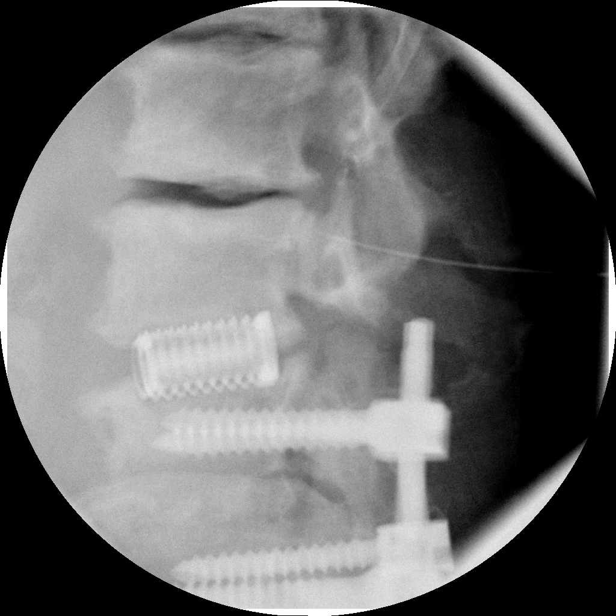

[Series 7: myelogram  white · 1 of 1 slices shown (4 of 13)]
[im 1/1]
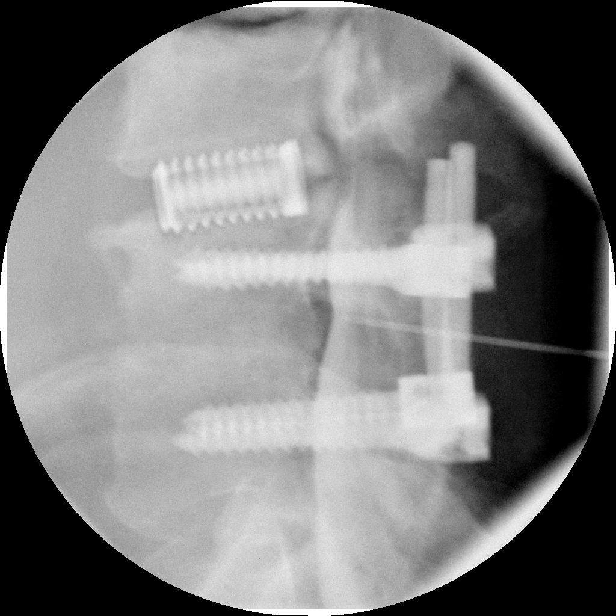

[Series 9: myelogram  white · 1 of 1 slices shown (5 of 13)]
[im 1/1]
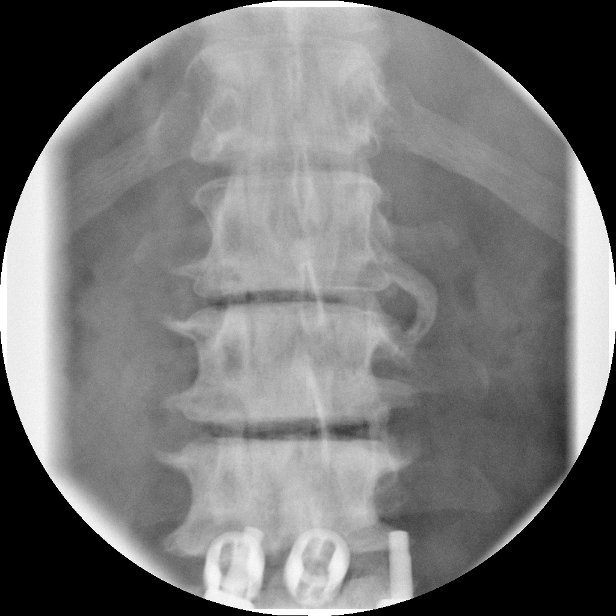

[Series 11: myelogram  white · 1 of 1 slices shown (6 of 13)]
[im 1/1]
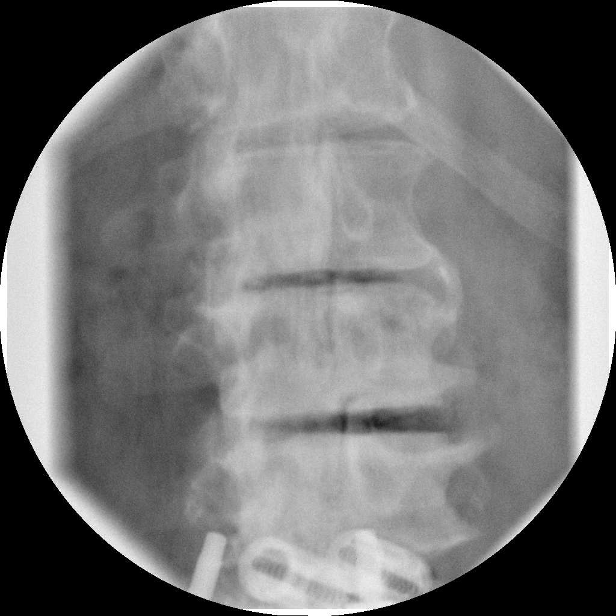

[Series 13: myelogram  white · 1 of 1 slices shown (7 of 13)]
[im 1/1]
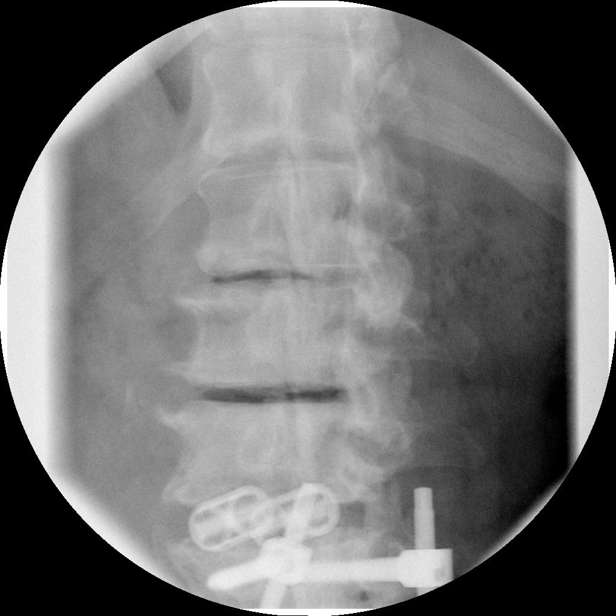

[Series 15: myelogram  white · 1 of 1 slices shown (8 of 13)]
[im 1/1]
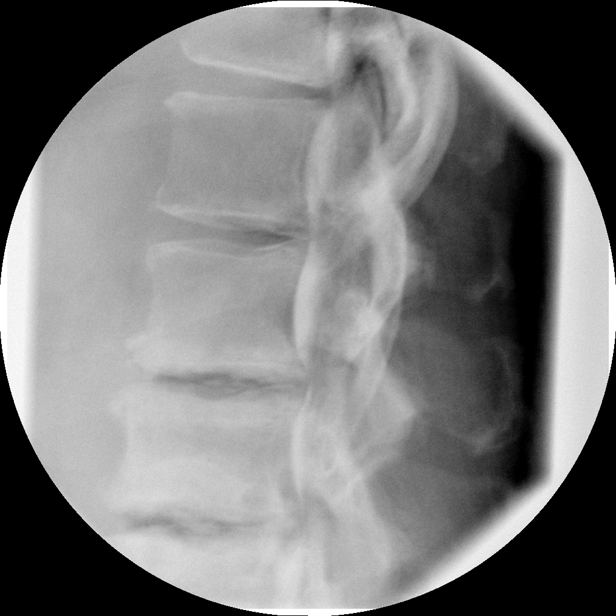

[Series 17: myelogram  white · 1 of 1 slices shown (9 of 13)]
[im 1/1]
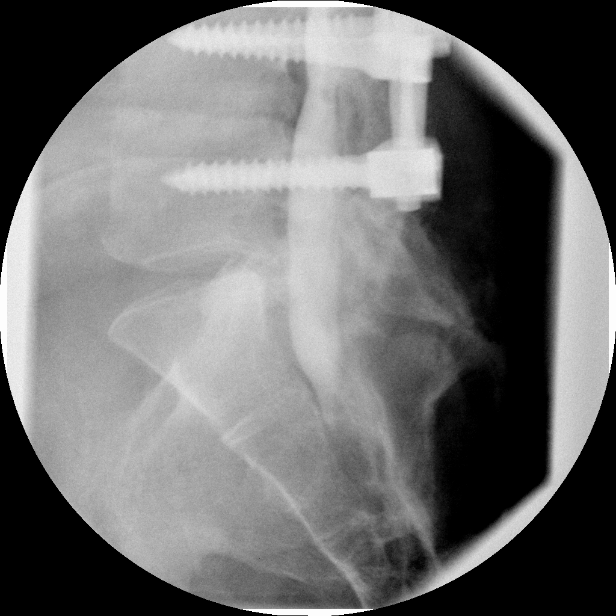

[Series 18: myelogram  white · 1 of 1 slices shown (10 of 13)]
[im 1/1]
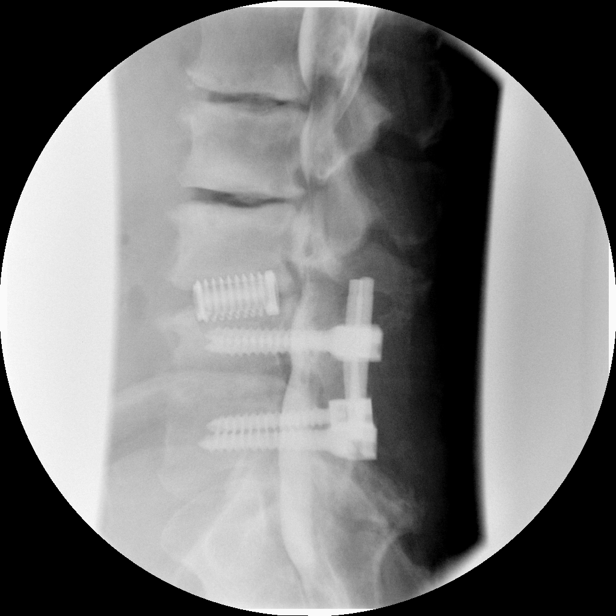

[Series 20: myelogram  white · 1 of 1 slices shown (11 of 13)]
[im 1/1]
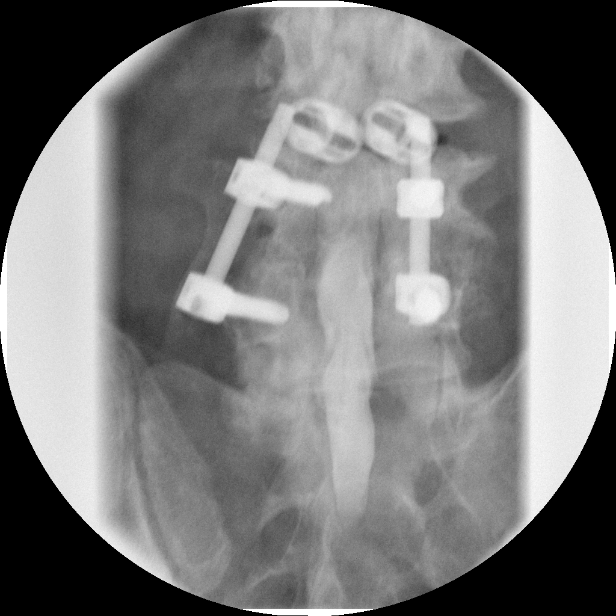

[Series 22: myelogram  white · 1 of 1 slices shown (12 of 13)]
[im 1/1]
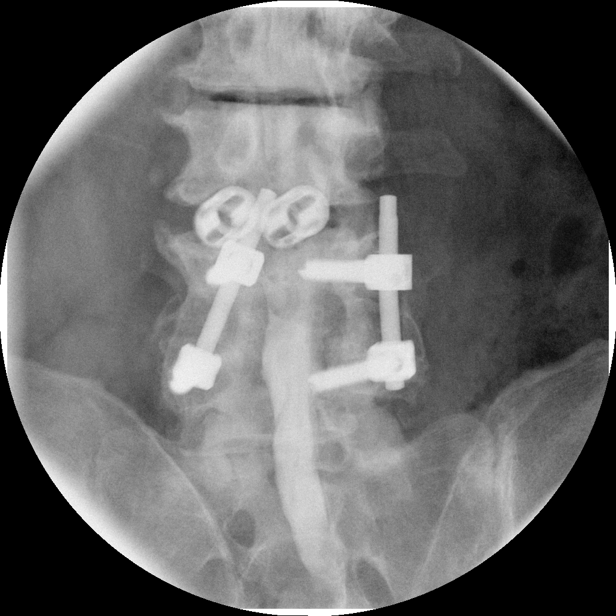

[Series 24: myelogram  white · 1 of 1 slices shown (13 of 13)]
[im 1/1]
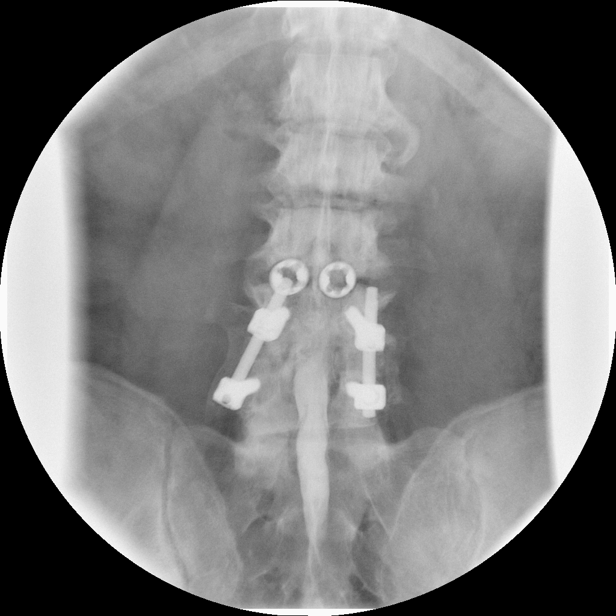

[13 of 23 positions shown; findings below may reference images not displayed]

FINDINGS: AP, lateral and oblique views reveal previous instrumentation at L3-4 and L4-5 status-post fusion.  There is some subsidence of the cage at L3-4 with lucency surrounding it indicating nonunion.  The fusion at L4-5 is solid with hardware intact.  There is a ventral defect at L3-4 with effacement of the L-4 roots.  The L4-5 and L5-S1 levels are relatively unremarkable.  There is a large ventral defect at L2-3 with significant stenosis.  Advanced vacuum disc phenomenon is present.  There is more mild stenosis at L1-2.
IMPRESSION: As discussed above; multifactorial stenosis especially L3-4 and L2-3 .
CT LUMBAR SPINE WITH CONTRAST (POST-MYELOGRAM):
FINDINGS: L1-2:  Advanced disc space narrowing with vacuum disc phenomenon.  Large extraforaminal spur on the right.  Posterior element hypertrophy with central bulging annular fibers.  Mild effacement of both L-2 roots.
L2-3:  Contrast injected on initial attempt at myelography at this level remains either within the thecal sac or within the subdural space.  There is a vacuum disc phenomenon with posterior protruding disc material and marked posterior element hypertrophy.  Significant compression of the thecal sac is present along with both L3 roots. 
L3-4:  Nonunion at this level with subsidence of the cages and a pseudoarthrosis posteriorly.  A lucency extends to the posterior elements (series 400 image 13) which was noted previously.  There is significant stenosis at this level with bilateral L-4 nerve root encroachment.  There is clumping in the nerve roots centrally suggesting arachnoiditis.  
L4-5:  Solid fusion.  No definite pathology. 
L5-S1:  Advanced facet arthropathy.  Mild annular bulging.  No central canal stenosis.  There is advanced bony overgrowth on the left   compressing the left L-5 nerve root.    There appears to be progression of foraminal narrowing at L5-S1 on the left since the prior exam
There is a small bubble of air within the thecal sac at L3-4 ventrally which I believe is iatrogenic related to the myelographic injection of contrast.  There is some vacuum disc phenomenon within the interspace related to the cages also abnormal and indicating movement and nonunion.  No central protrusion is present.  .
IMPRESSION: 1.  Solid fusion L4-5.
2.  Nonunion at L3-4 with pseudoarthrosis and significant spinal stenosis. 
3.  Central protrusion L2-3 with posterior element hypertrophy and significant stenosis.
4.  Advanced disc space narrowing L1-2 with annular bulging and a large extraforaminal spur on the right. 
5.  Mild bulge L5-S1.
6.  Clumping in the nerve roots centrally with poor detail of intrathecal structures suggesting arachnoiditis.  
7.  Little change compared with the prior study of 05/13/03. 
8.  Foraminal narrowing L5-S1, left compressing the left L-5 nerve root; worse since the prior study.

## 2006-12-13 IMAGING — CT CT L SPINE W/ CM
2 of 8 series · 12 of 27 positions shown, 15 images · IV contrast (omnipaque)
Comparison: CT myelogram 05/13/03.

CLINICAL DATA: Severe back pain and bilateral leg pain, left worse than right.  History of diabetes.  
LUMBAR MYELOGRAM:
TECHNIQUE: The patient was given informed consent including the risks of bleeding, infection (increased in diabetics), neurological deficit and postmyelogram headache.  The patient agreed to proceed.  Initially I chose a puncture site at L5-S1.  I could not enter the thecal sac at this level.  Next I chose a lumbar puncture site at L2-3.  There was no return of CSF and I got a mixed injection despite good placement of the needle within the spinal canal.  Finally I chose an 18 gauge needle and approached the thecal sac in the midline at L-4.  There was good return of clear and colorless CSF.  I injected 15 cc of Omnipaque 180 into the subarachnoid space.
TECHNIQUE: Multidetector CT imaging of the lumbar spine was performed after intrathecal injection of contrast.  Multiplanar CT image reconstructions were also generated.

[Series 4: recon 3: l-spine helical · axial · 0.27mm/px · z∈[-146,-7]mm · 7 of 149 slices shown, 9 images]
[im 19/149  soft-tissue]
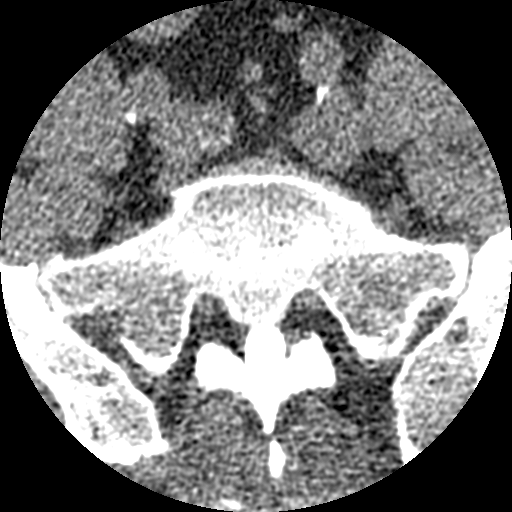
[im 19/149  bone]
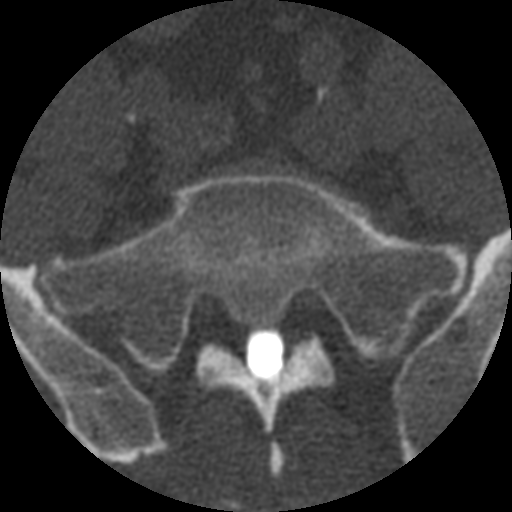
[im 38/149  bone]
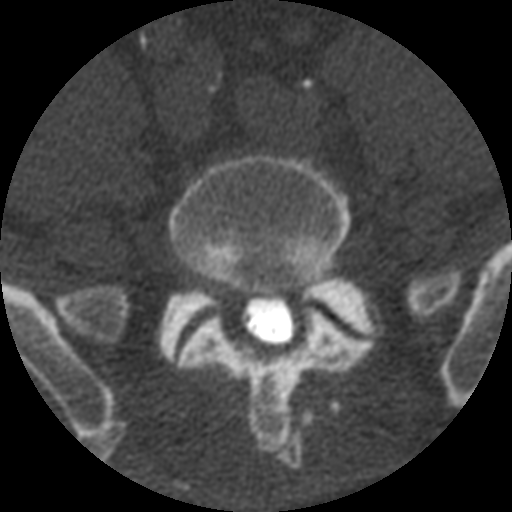
[im 56/149  bone]
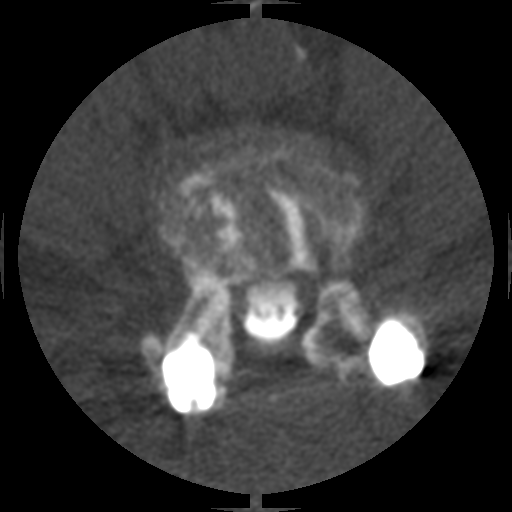
[im 75/149  bone]
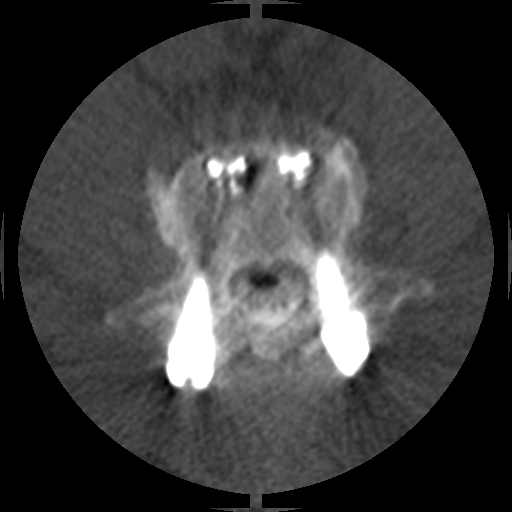
[im 93/149  soft-tissue]
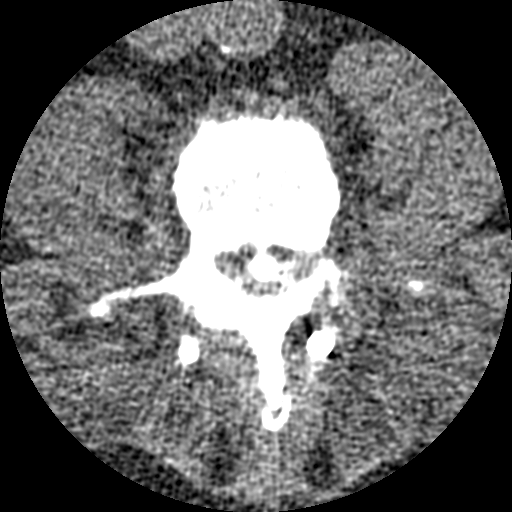
[im 93/149  bone]
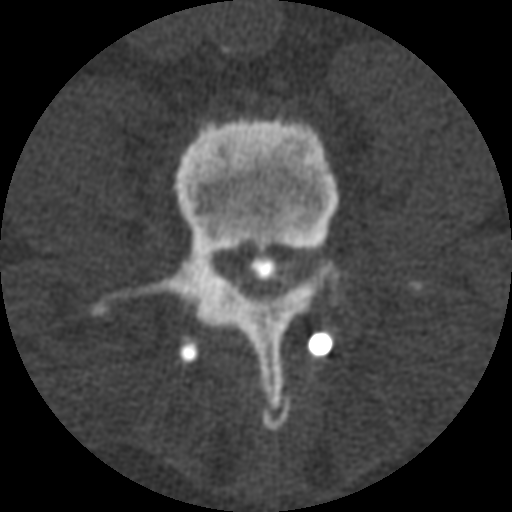
[im 112/149  bone]
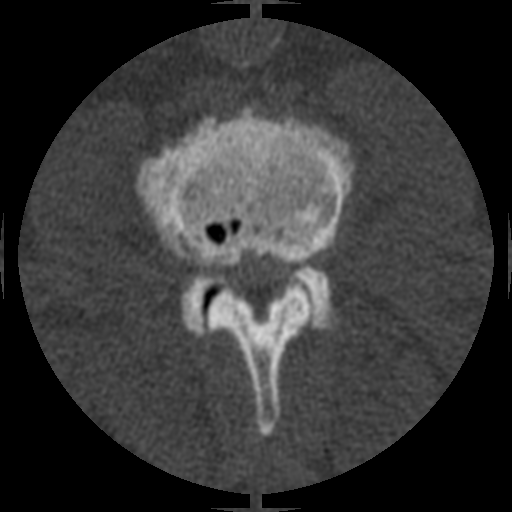
[im 130/149  bone]
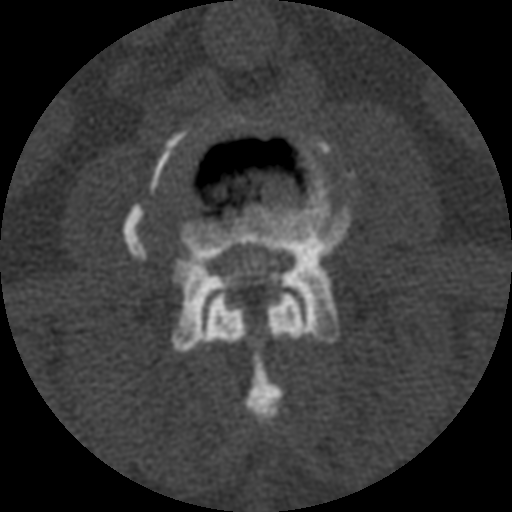

[Series 401: reformatted · coronal · 0.37mm/px · 5 of 40 slices shown, 6 images]
[im 7/40  bone]
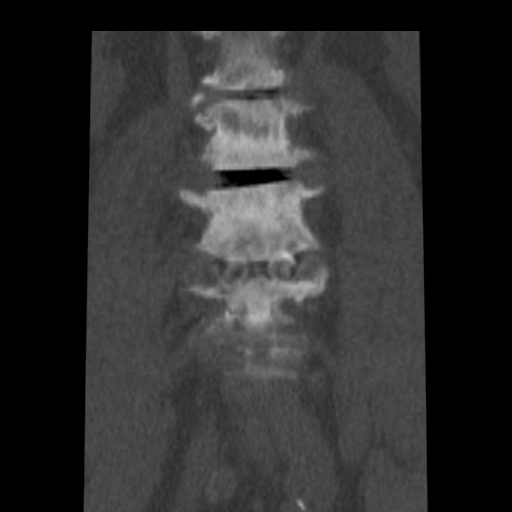
[im 14/40  bone]
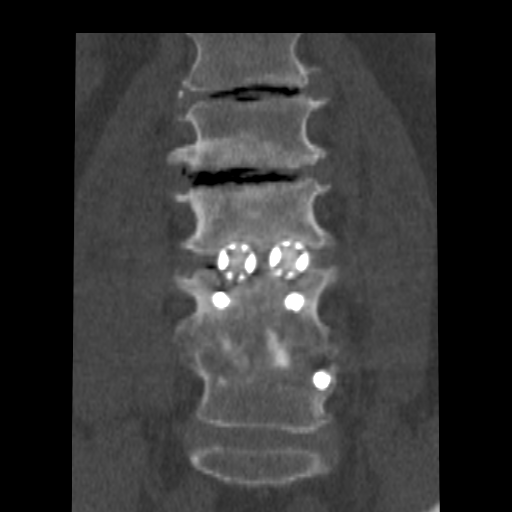
[im 20/40  soft-tissue]
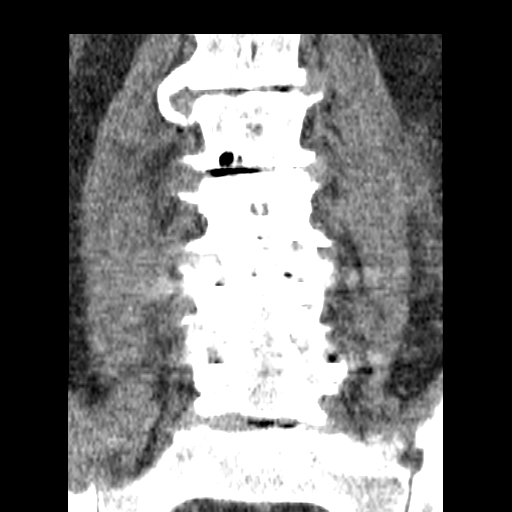
[im 20/40  bone]
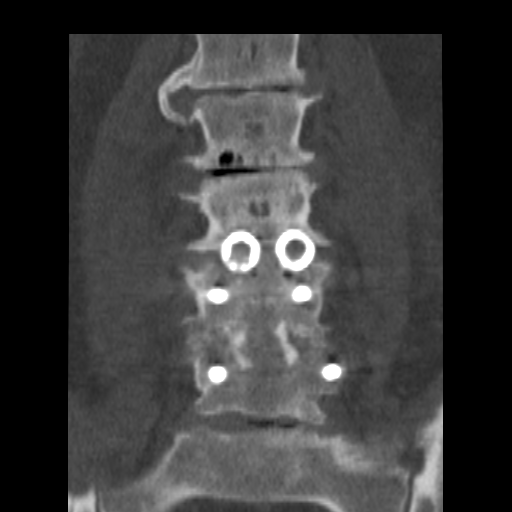
[im 27/40  bone]
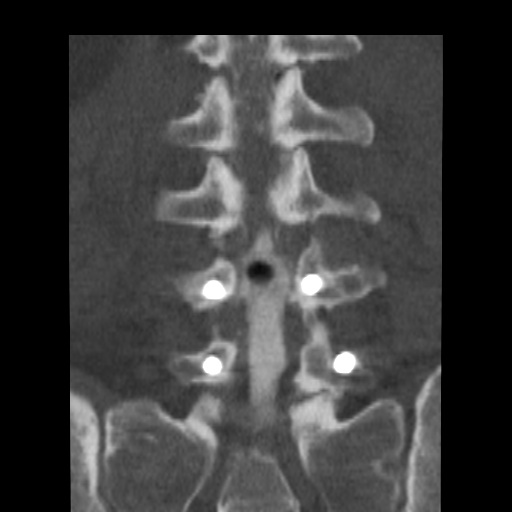
[im 33/40  bone]
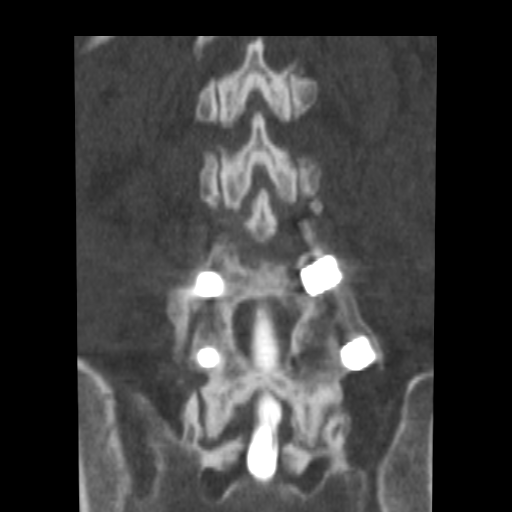

[12 of 27 positions shown; findings below may reference images not displayed]

FINDINGS: AP, lateral and oblique views reveal previous instrumentation at L3-4 and L4-5 status-post fusion.  There is some subsidence of the cage at L3-4 with lucency surrounding it indicating nonunion.  The fusion at L4-5 is solid with hardware intact.  There is a ventral defect at L3-4 with effacement of the L-4 roots.  The L4-5 and L5-S1 levels are relatively unremarkable.  There is a large ventral defect at L2-3 with significant stenosis.  Advanced vacuum disc phenomenon is present.  There is more mild stenosis at L1-2.
IMPRESSION: As discussed above; multifactorial stenosis especially L3-4 and L2-3 .
CT LUMBAR SPINE WITH CONTRAST (POST-MYELOGRAM):
FINDINGS: L1-2:  Advanced disc space narrowing with vacuum disc phenomenon.  Large extraforaminal spur on the right.  Posterior element hypertrophy with central bulging annular fibers.  Mild effacement of both L-2 roots.
L2-3:  Contrast injected on initial attempt at myelography at this level remains either within the thecal sac or within the subdural space.  There is a vacuum disc phenomenon with posterior protruding disc material and marked posterior element hypertrophy.  Significant compression of the thecal sac is present along with both L3 roots. 
L3-4:  Nonunion at this level with subsidence of the cages and a pseudoarthrosis posteriorly.  A lucency extends to the posterior elements (series 400 image 13) which was noted previously.  There is significant stenosis at this level with bilateral L-4 nerve root encroachment.  There is clumping in the nerve roots centrally suggesting arachnoiditis.  
L4-5:  Solid fusion.  No definite pathology. 
L5-S1:  Advanced facet arthropathy.  Mild annular bulging.  No central canal stenosis.  There is advanced bony overgrowth on the left   compressing the left L-5 nerve root.    There appears to be progression of foraminal narrowing at L5-S1 on the left since the prior exam
There is a small bubble of air within the thecal sac at L3-4 ventrally which I believe is iatrogenic related to the myelographic injection of contrast.  There is some vacuum disc phenomenon within the interspace related to the cages also abnormal and indicating movement and nonunion.  No central protrusion is present.  .
IMPRESSION: 1.  Solid fusion L4-5.
2.  Nonunion at L3-4 with pseudoarthrosis and significant spinal stenosis. 
3.  Central protrusion L2-3 with posterior element hypertrophy and significant stenosis.
4.  Advanced disc space narrowing L1-2 with annular bulging and a large extraforaminal spur on the right. 
5.  Mild bulge L5-S1.
6.  Clumping in the nerve roots centrally with poor detail of intrathecal structures suggesting arachnoiditis.  
7.  Little change compared with the prior study of 05/13/03. 
8.  Foraminal narrowing L5-S1, left compressing the left L-5 nerve root; worse since the prior study.

## 2006-12-21 IMAGING — CR DG CHEST 2V
2 series · 2 of 2 positions shown · non-contrast
Comparison: None.

CLINICAL DATA: Cough, short of breath, fever.

 CHEST, TWO VIEWS:

[w chest pa]
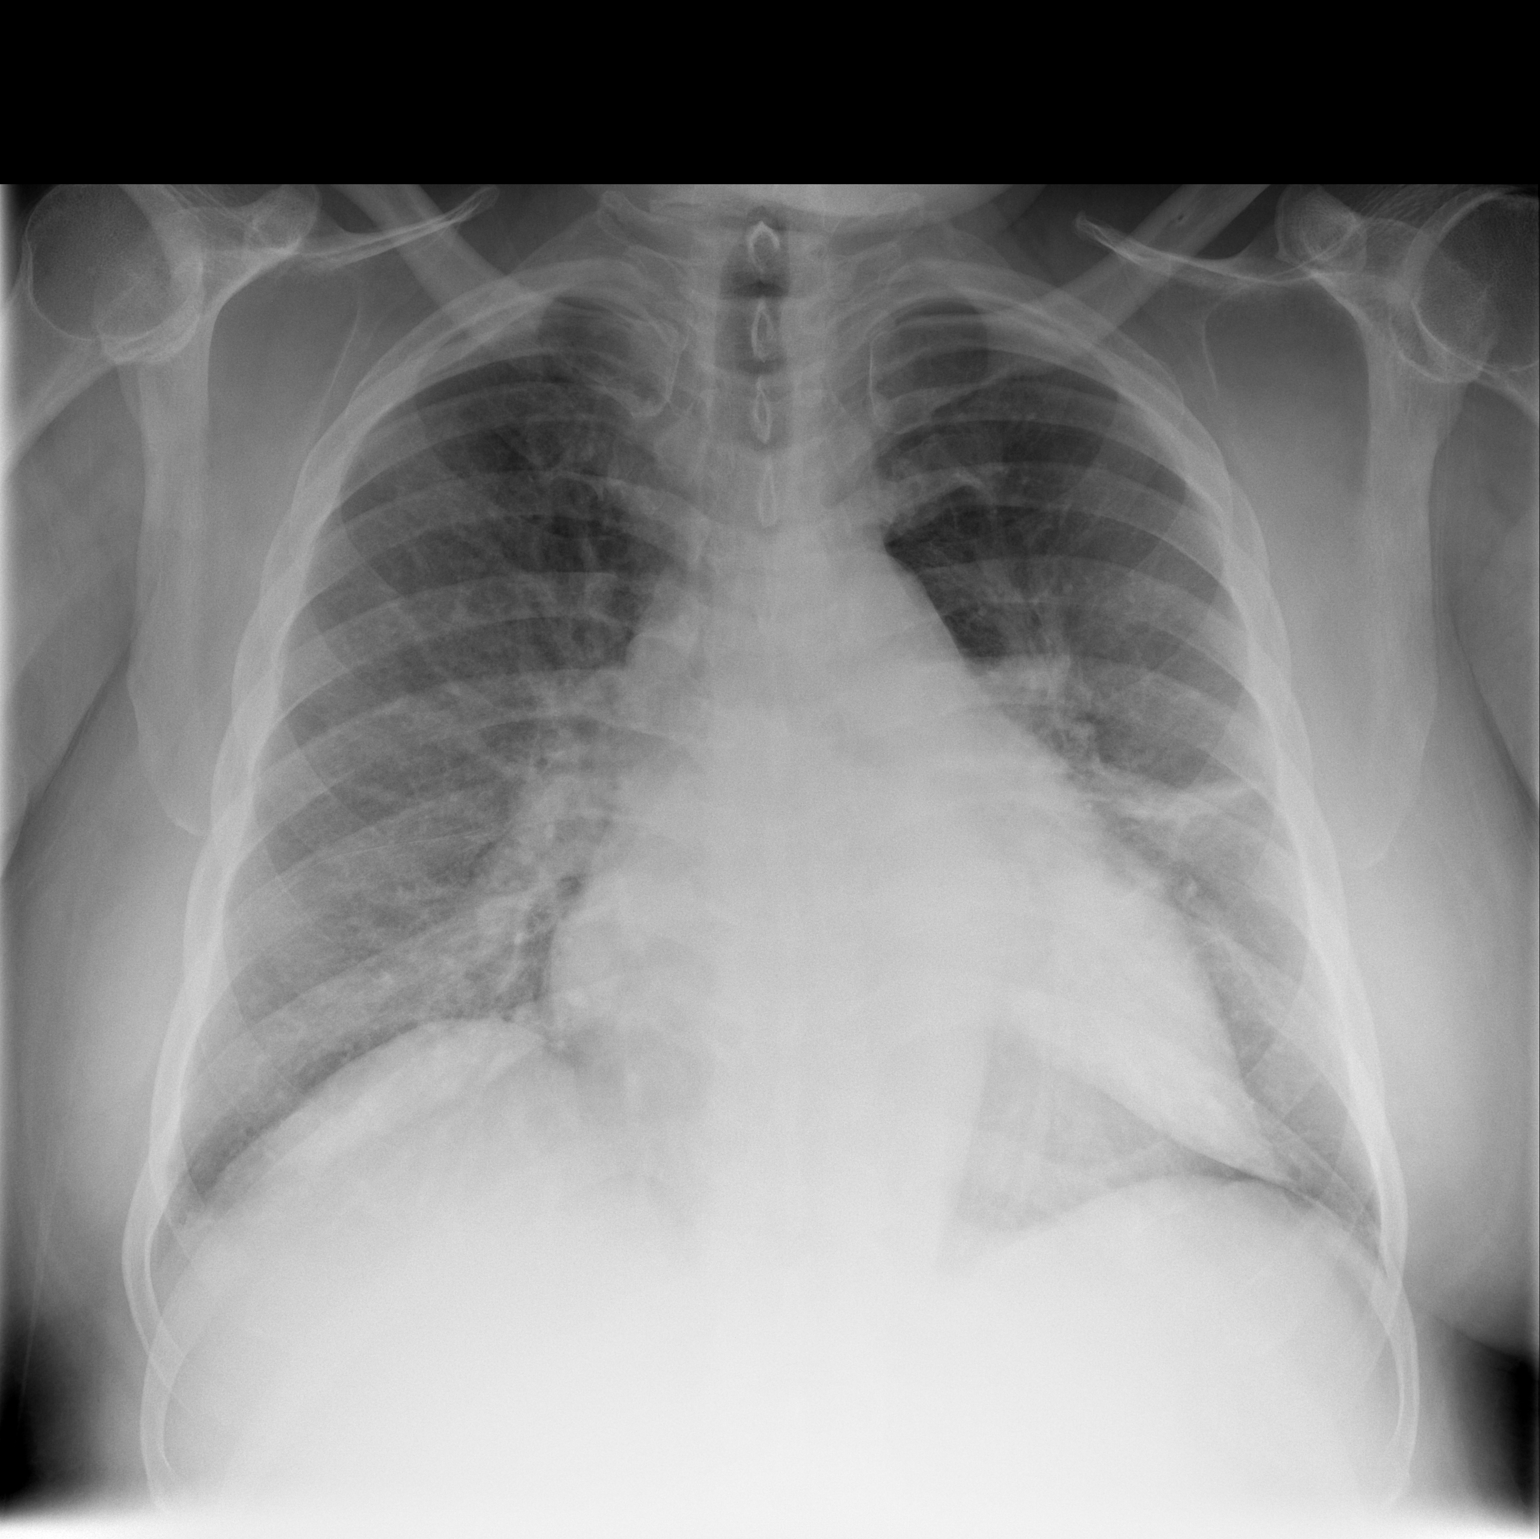

[w chest lat]
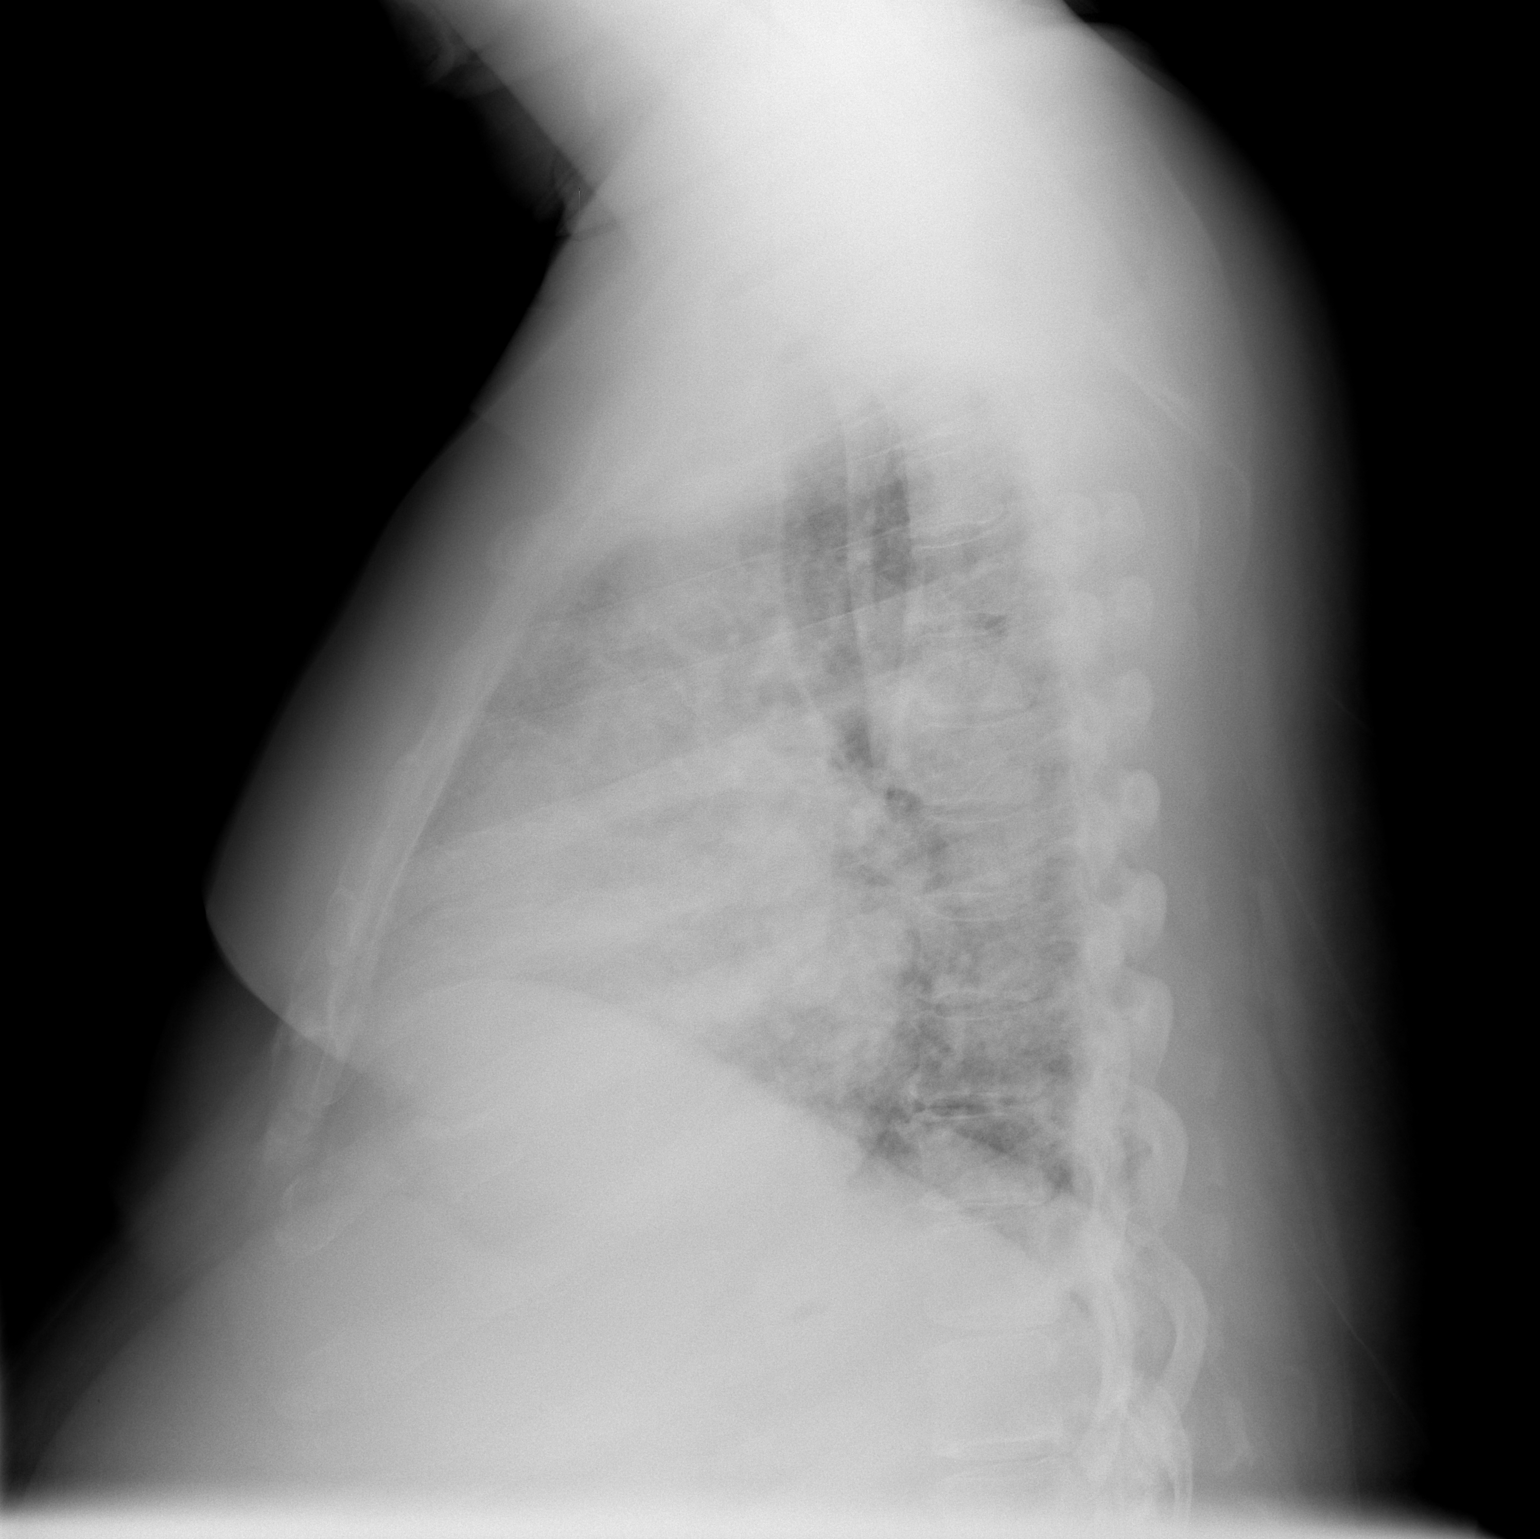

[2 of 2 positions shown; findings below may reference images not displayed]

FINDINGS: The heart is enlarged.  There are prominent markings without Kerley lines or effusions.  Probable chronic change but cannot exclude mild CHF.  There is also a band of atelectasis or atelectatic pneumonia seen on the PA view in the left mid lung zone.  This is not seen on the lateral.  Cannot rule out perihilar pneumonia.  This needs careful follow-up.
IMPRESSION: 1.  Cardiomegaly with possible mild congestive heart failure. 

 2.  Left mid lung area of atelectasis or scar.  Possible pneumonia.  See report.

## 2006-12-27 IMAGING — CR DG CHEST 1V PORT
1 series · 1 of 1 positions shown · non-contrast
Comparison: 03/01/06.

CLINICAL DATA: CHF.  
 PORTABLE CHEST - 1 VIEW, 2622 HOURS, 03/07/06:

[view not recorded]
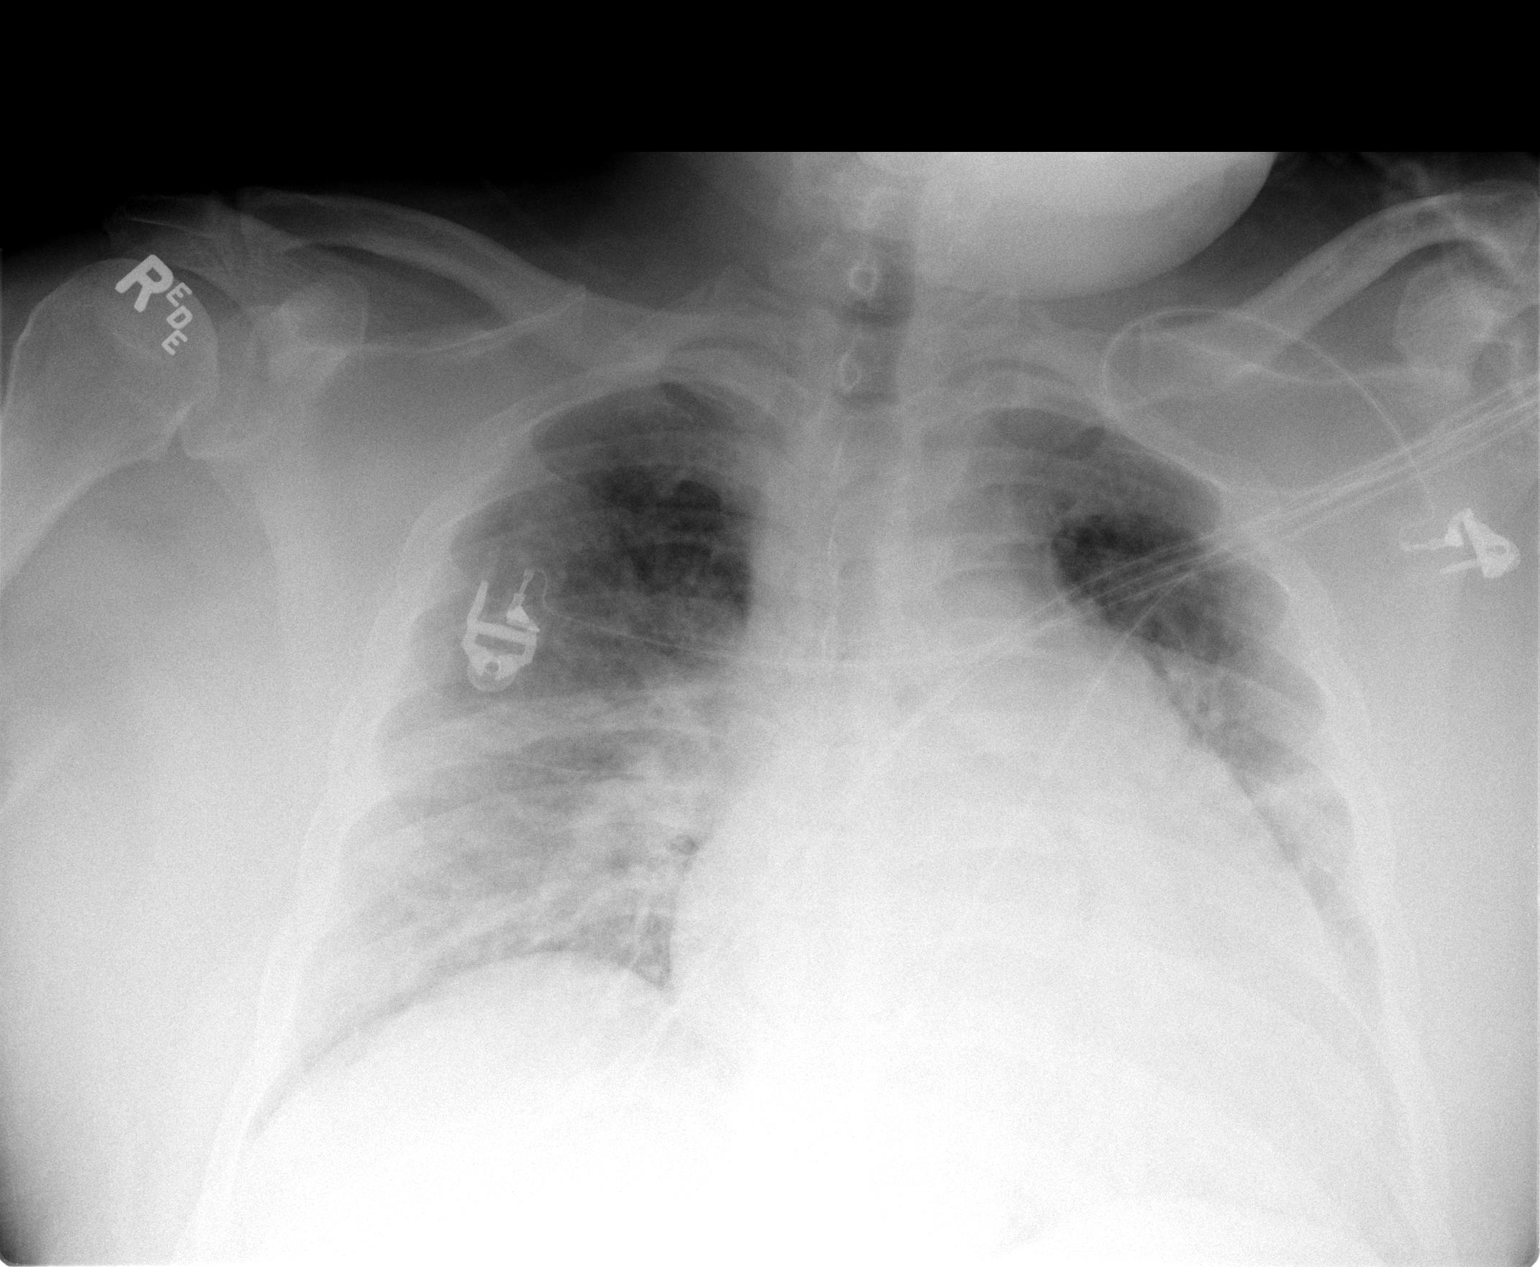

[1 of 1 positions shown; findings below may reference images not displayed]

FINDINGS: Compared to the prior film, there is significant decrease in bilateral lung volumes.  Residual edema remains present, which is relatively stable when considering the lower lung volumes.  Stable global cardiomegaly.
IMPRESSION: Lower bilateral lung volumes.  Stable CHF and cardiomegaly.

## 2006-12-28 IMAGING — CR DG CHEST 1V PORT
1 series · 1 of 1 positions shown · non-contrast
Comparison: 03/07/06.

CLINICAL DATA: CHF.
 PORTABLE CHEST - 1 VIEW ? 03/08/06:

[view not recorded]
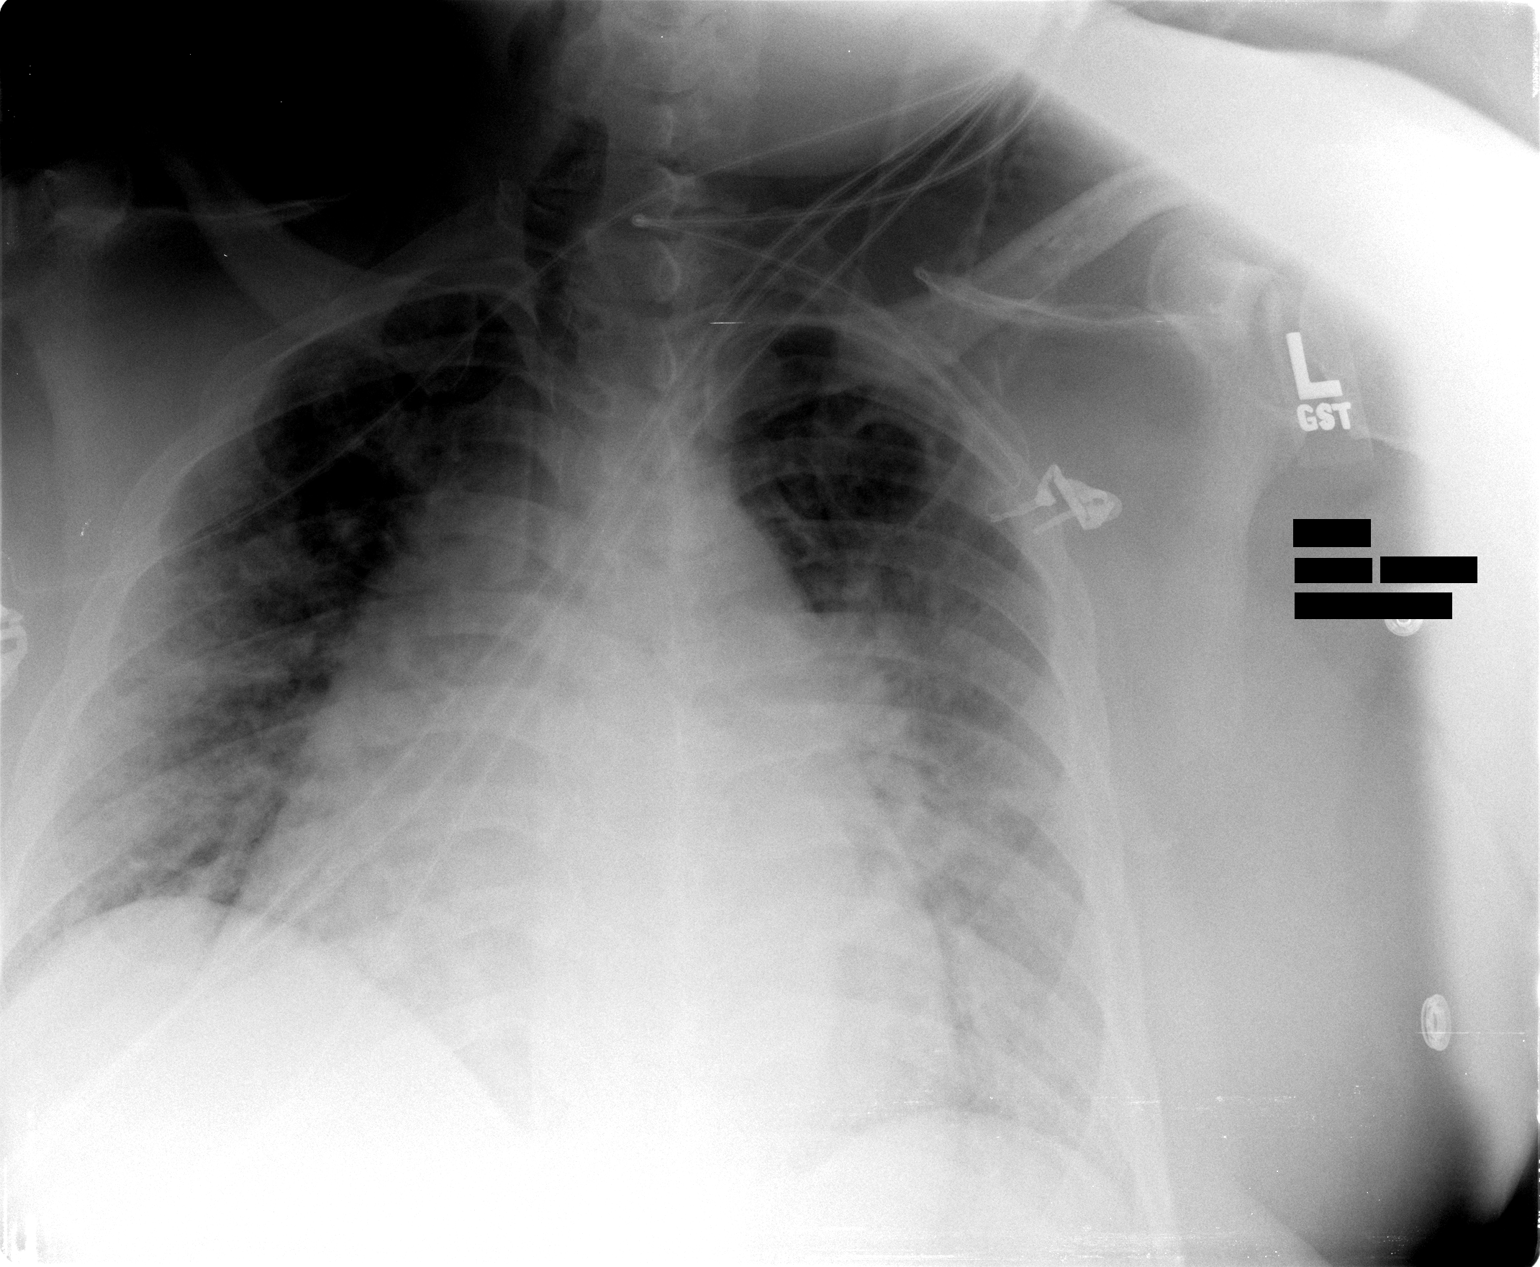

[1 of 1 positions shown; findings below may reference images not displayed]

FINDINGS: Cardiomegaly and interstitial edema appear unchanged.  There is no new abnormality.
IMPRESSION: No interval change.

## 2006-12-29 IMAGING — CR DG CHEST 1V PORT
1 series · 1 of 1 positions shown · non-contrast
Comparison: 03/08/06
 Marked cardiac enlargement redemonstrated.  Partial clearing edema.

CLINICAL DATA: History of congestive failure.  
 PORTABLE CHEST- 1 VIEW:

[view not recorded]
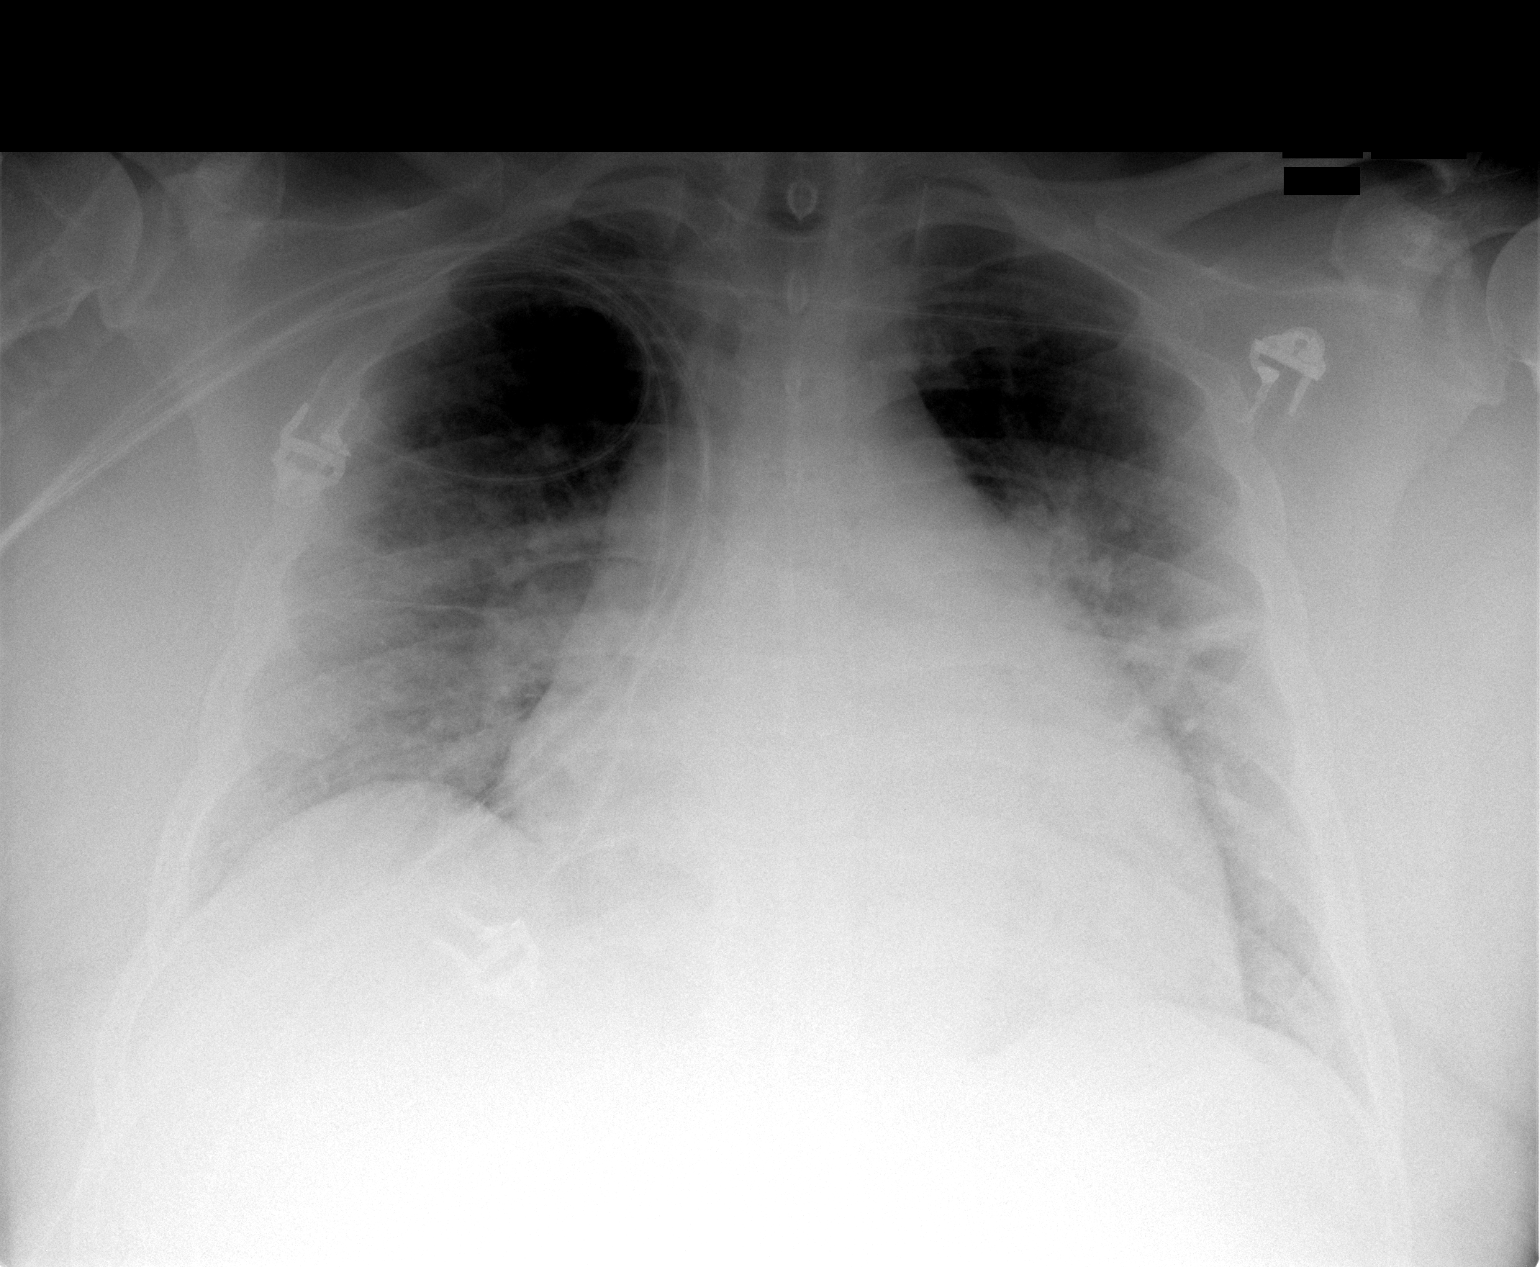

[1 of 1 positions shown; findings below may reference images not displayed]

IMPRESSION: Slight improvement.

## 2007-01-01 IMAGING — CR DG CHEST 1V PORT
1 series · 1 of 1 positions shown · non-contrast
Comparison: 03/09/2006

CLINICAL DATA: CHF, shortness of breath

PORTABLE CHEST - 1 VIEW:

[view not recorded]
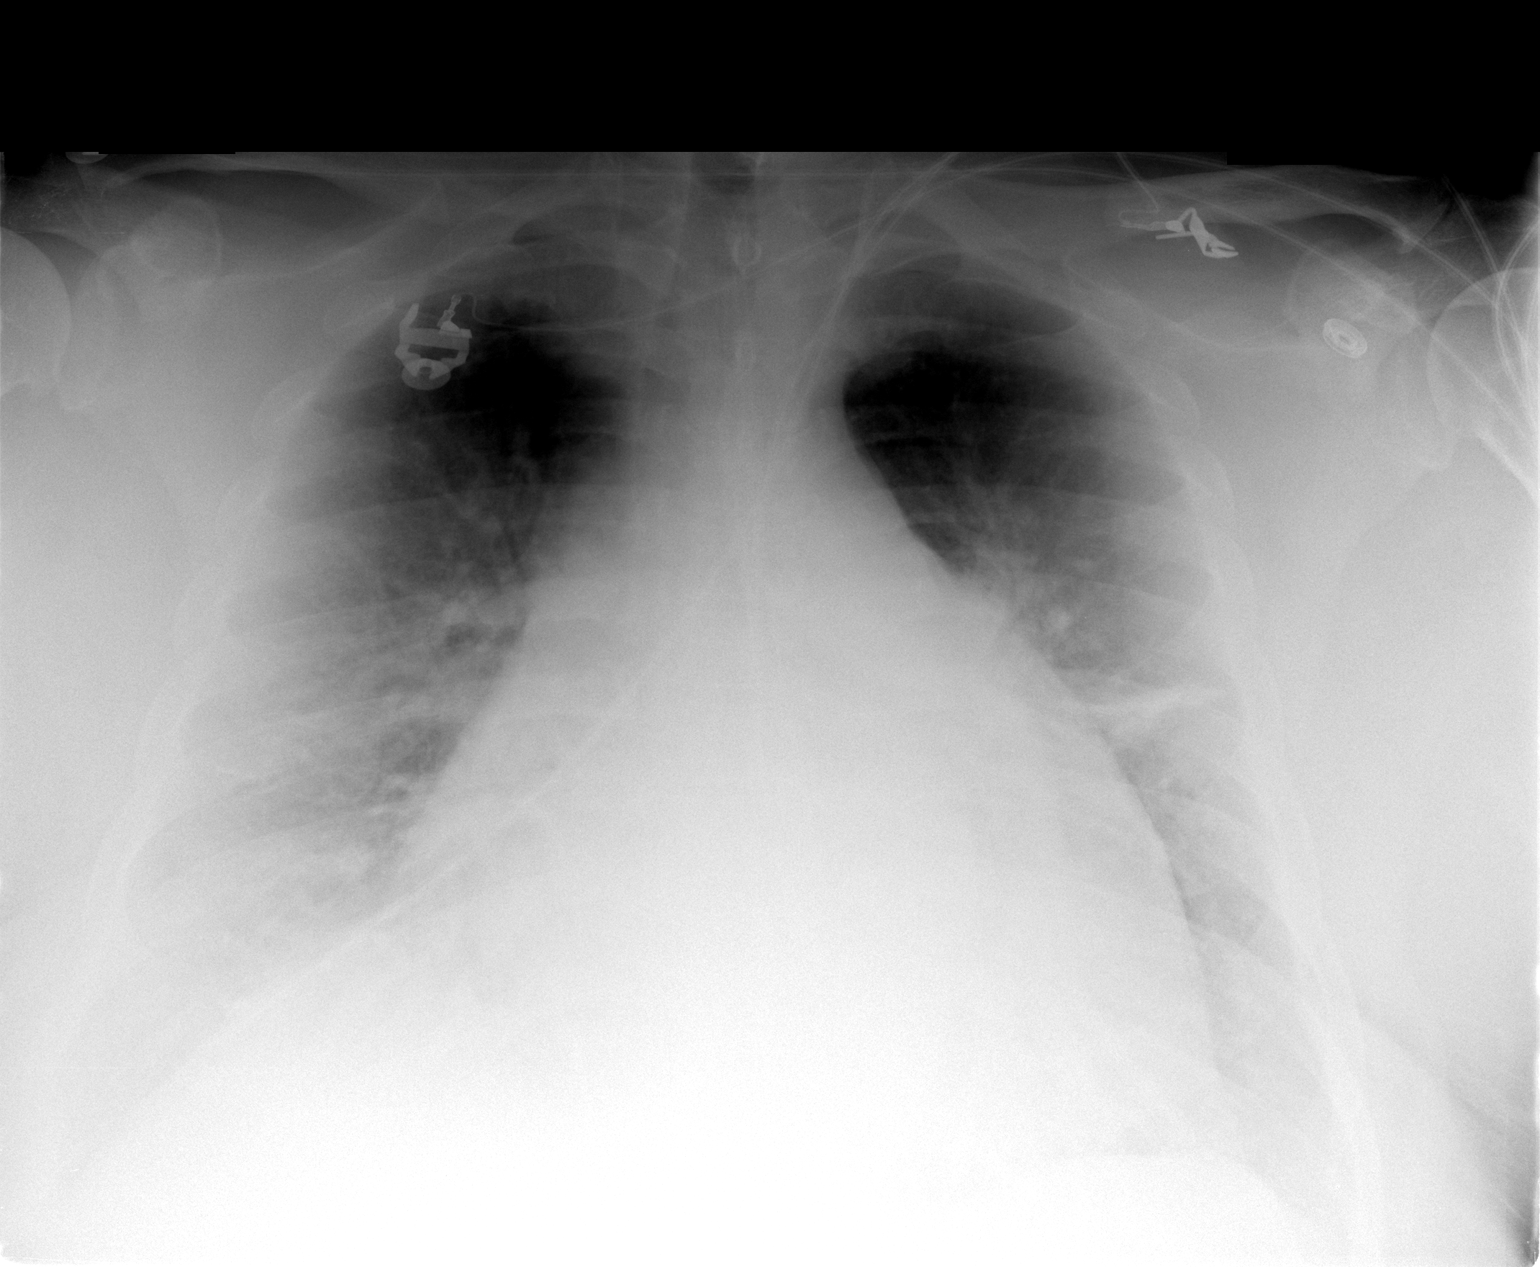

[1 of 1 positions shown; findings below may reference images not displayed]

FINDINGS: There is continued cardiomegaly with bilateral airspace disease
compatible with CHF. No real change.
IMPRESSION: No significant change CHF.

## 2007-01-03 IMAGING — US US RENAL
1 series · 14 of 22 positions shown · non-contrast
Comparison: none

CLINICAL DATA: Increasing creatinine.  Chronic renal failure.  Diabetes.
RENAL/URINARY TRACT ULTRASOUND ? 03/15/06:
TECHNIQUE: Complete ultrasound examination of the urinary tract was performed including evaluation of the kidneys, renal collecting systems, and urinary bladder. 
No prior studies.

[Series 1: unknown · 0.26mm/px · 14 of 22 slices shown]
[im 1/22]
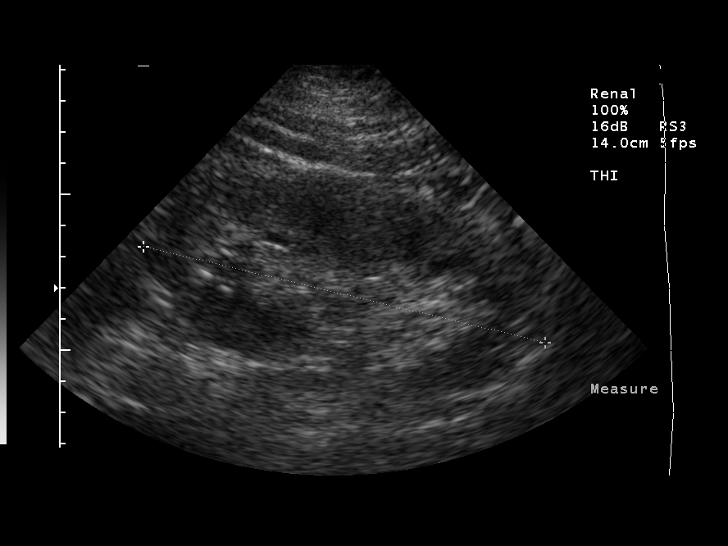
[im 3/22]
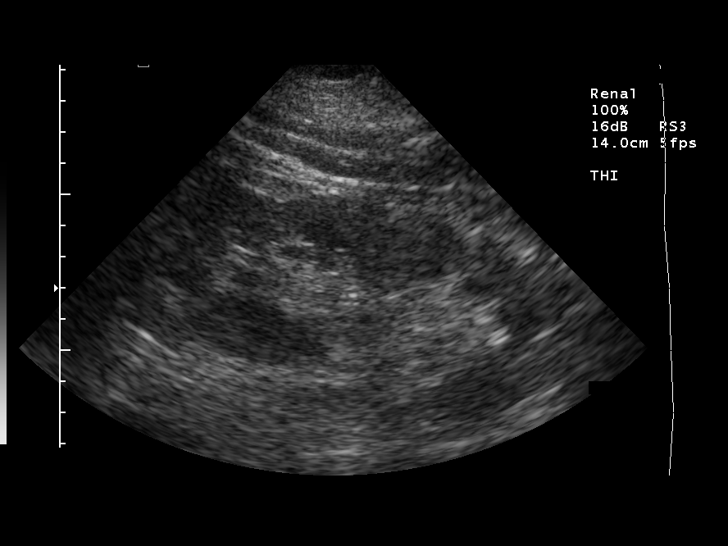
[im 4/22]
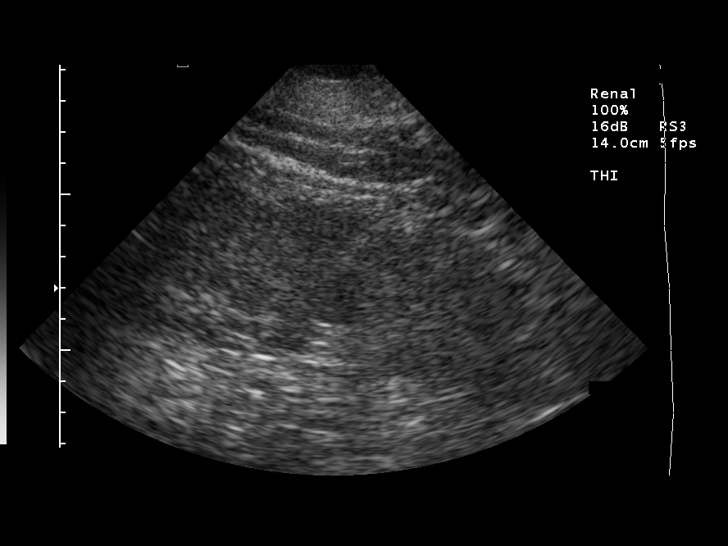
[im 6/22]
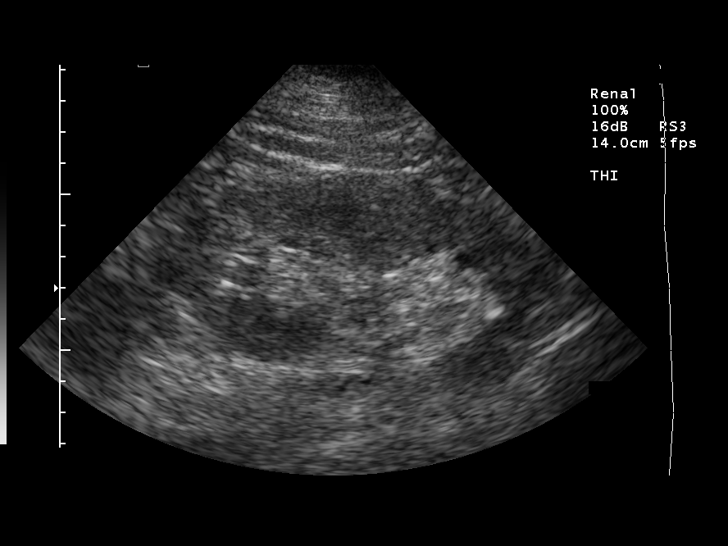
[im 8/22]
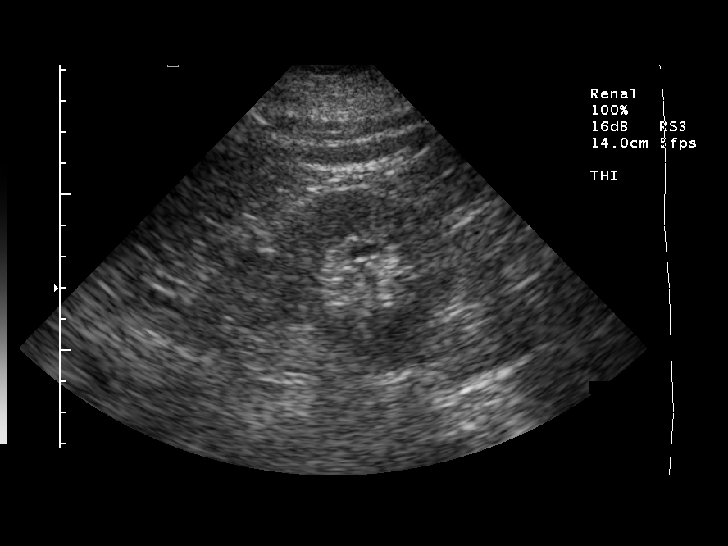
[im 9/22]
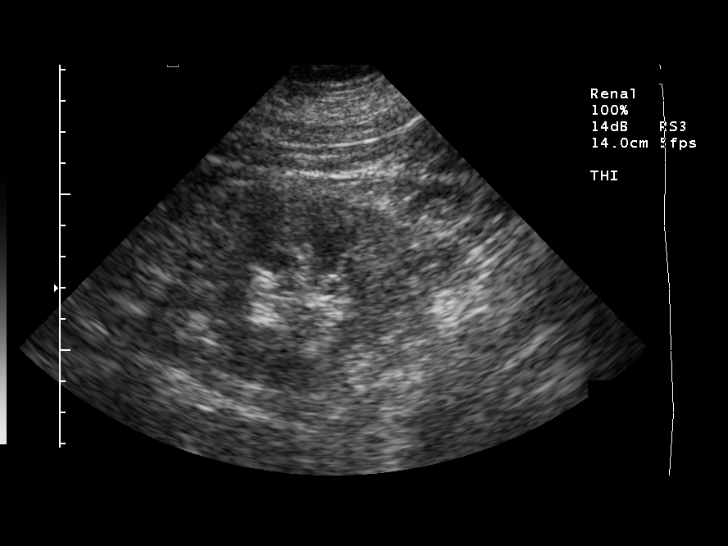
[im 11/22]
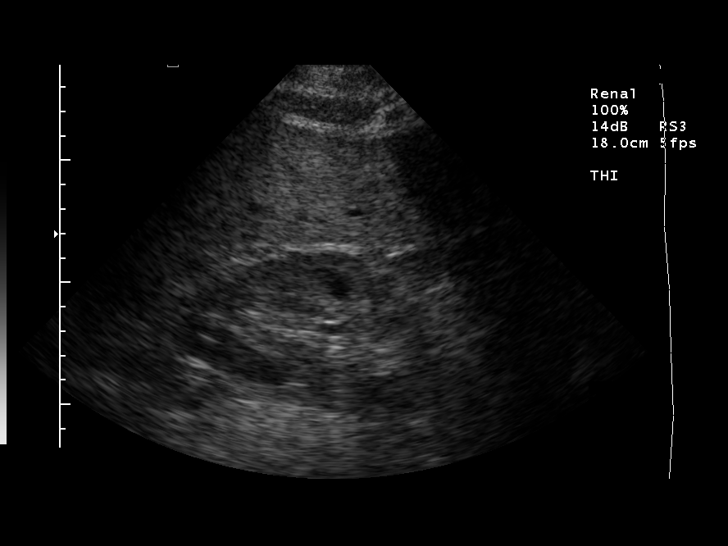
[im 12/22]
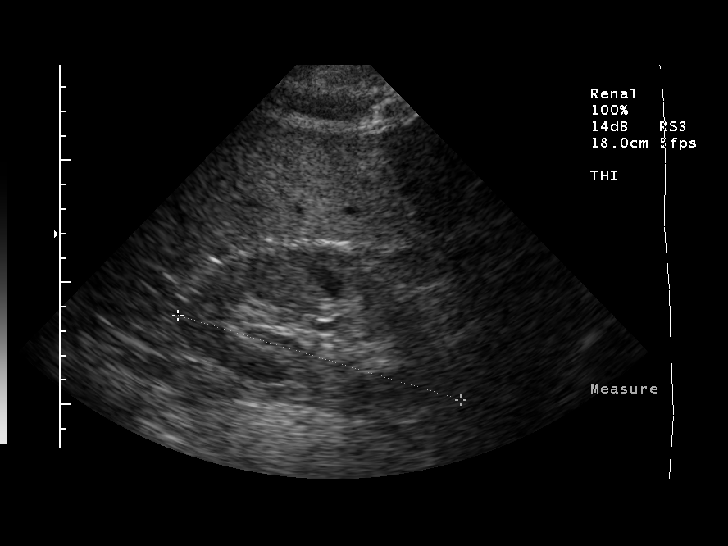
[im 14/22]
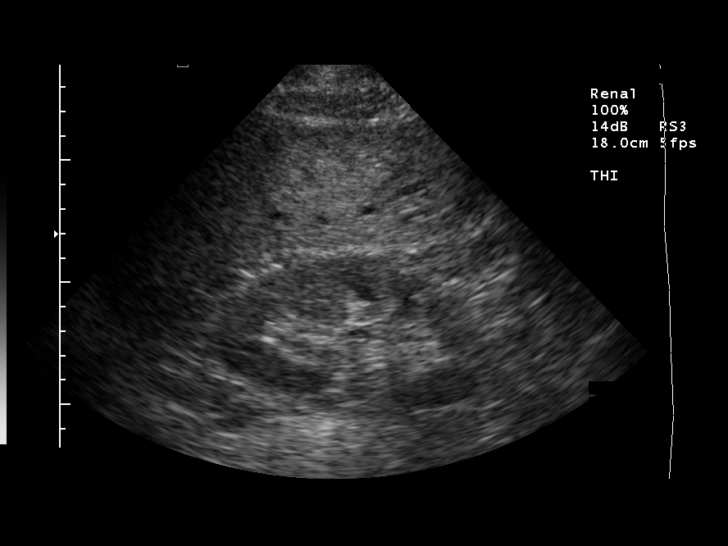
[im 15/22]
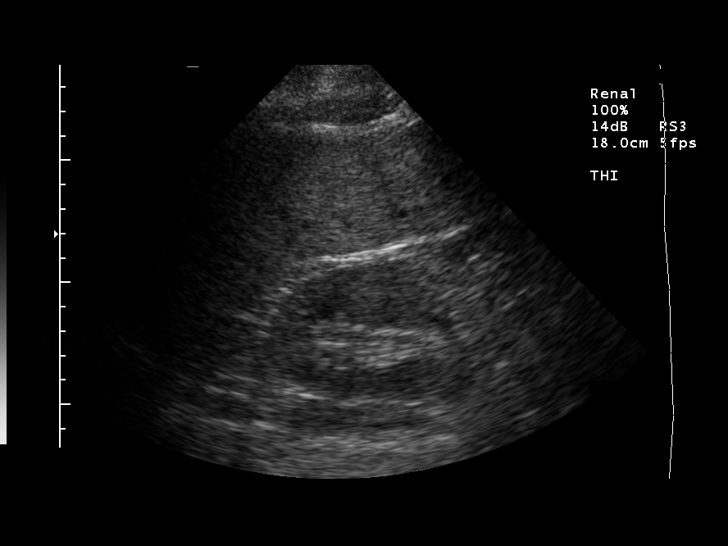
[im 17/22]
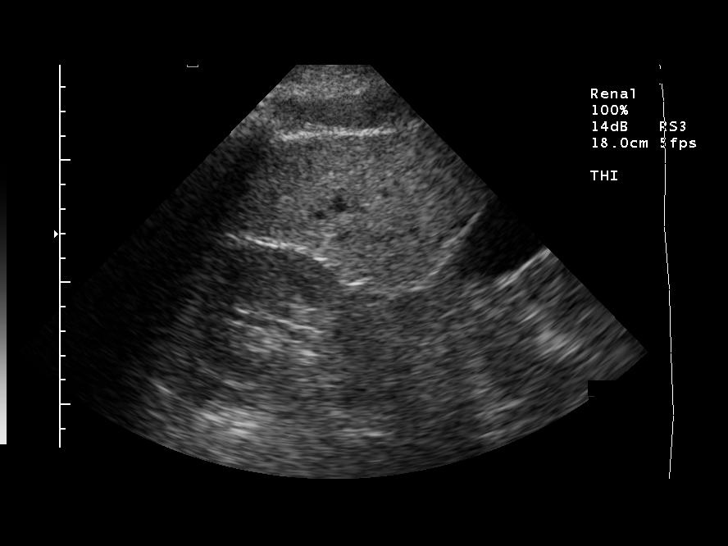
[im 19/22]
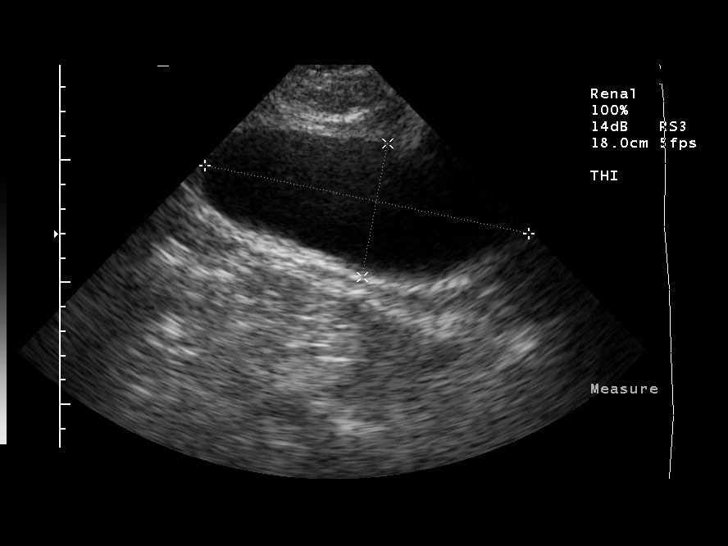
[im 20/22]
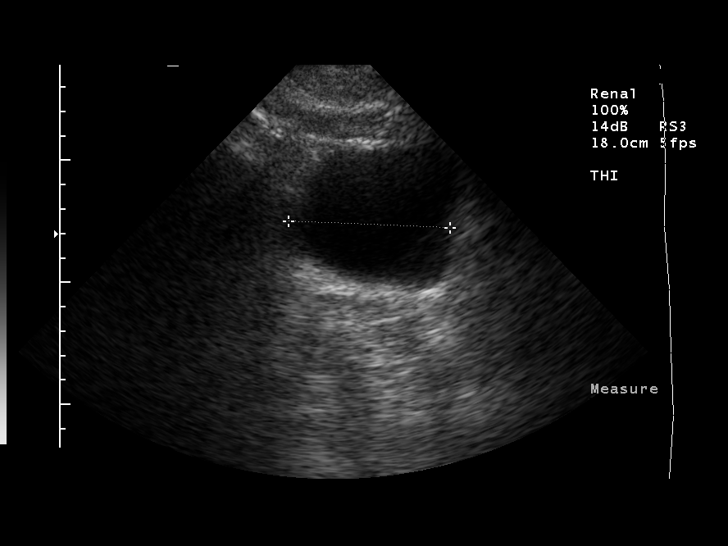
[im 22/22]
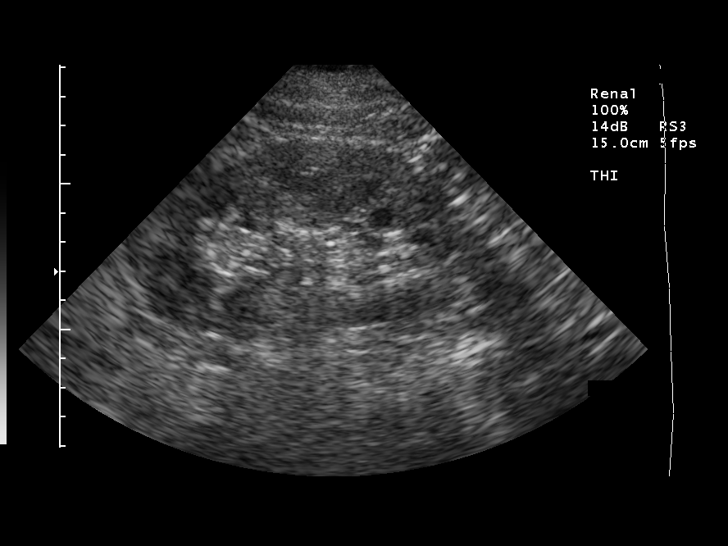

[14 of 22 positions shown; findings below may reference images not displayed]

FINDINGS: There is an appearance suggesting a small right renal cyst.  No hydronephrosis or hydroureter.  No renal mass or renal calculi.  The right kidney measures 12.0 cm in greatest length and the left kidney measures 13.2 cm in greatest length.  
The kidneys appear mildly echogenic.  Although the right kidney is less echogenic than the liver, I suspect that this is likely due to fatty infiltration of the liver.
IMPRESSION: 1.  Probable mildly echogenic kidneys, as can be encountered in chronic medical renal disease.
2.  No evidence of hydronephrosis.
3.  Possible right midkidney cyst.

## 2007-01-03 IMAGING — CR DG CHEST 1V PORT
1 series · 1 of 1 positions shown · non-contrast
Comparison: 03/12/06.

CLINICAL DATA: Congestive heart failure.
 PORTABLE CHEST - 1 VIEW:

[view not recorded]
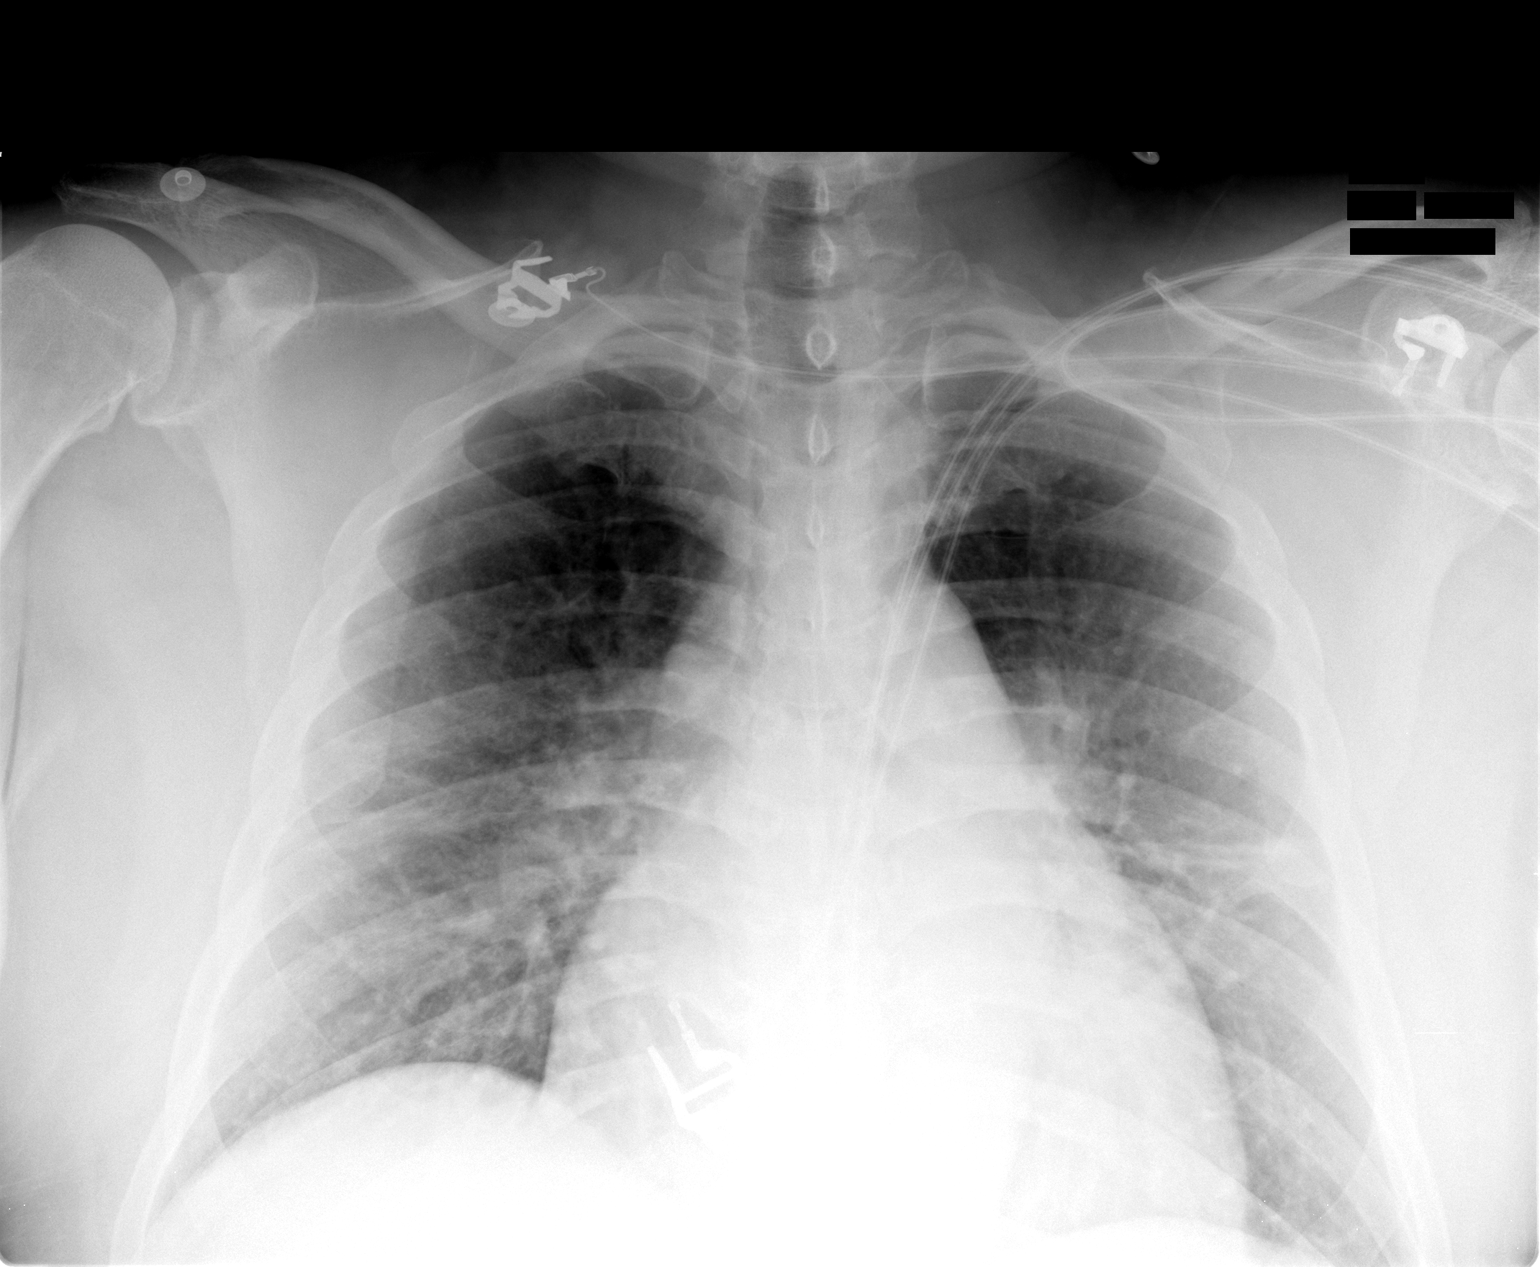

[1 of 1 positions shown; findings below may reference images not displayed]

FINDINGS: The heart is smaller.  Mild vascular congestion persists.  No definite pneumonia.
IMPRESSION: Heart smaller with decreased congestive heart failure.

## 2007-02-06 ENCOUNTER — Ambulatory Visit (HOSPITAL_COMMUNITY): Admission: RE | Admit: 2007-02-06 | Discharge: 2007-02-06 | Payer: Self-pay | Admitting: Nephrology

## 2007-02-06 ENCOUNTER — Encounter: Admission: RE | Admit: 2007-02-06 | Discharge: 2007-02-06 | Payer: Self-pay | Admitting: Nephrology

## 2007-02-18 IMAGING — CR DG CHEST 2V
2 series · 2 of 2 positions shown · non-contrast
Comparison: 03/14/2006.

CLINICAL DATA: Loss of appetite.  History of congestive heart failure.  
 CHEST - 2 VIEW:

[w chest pa]
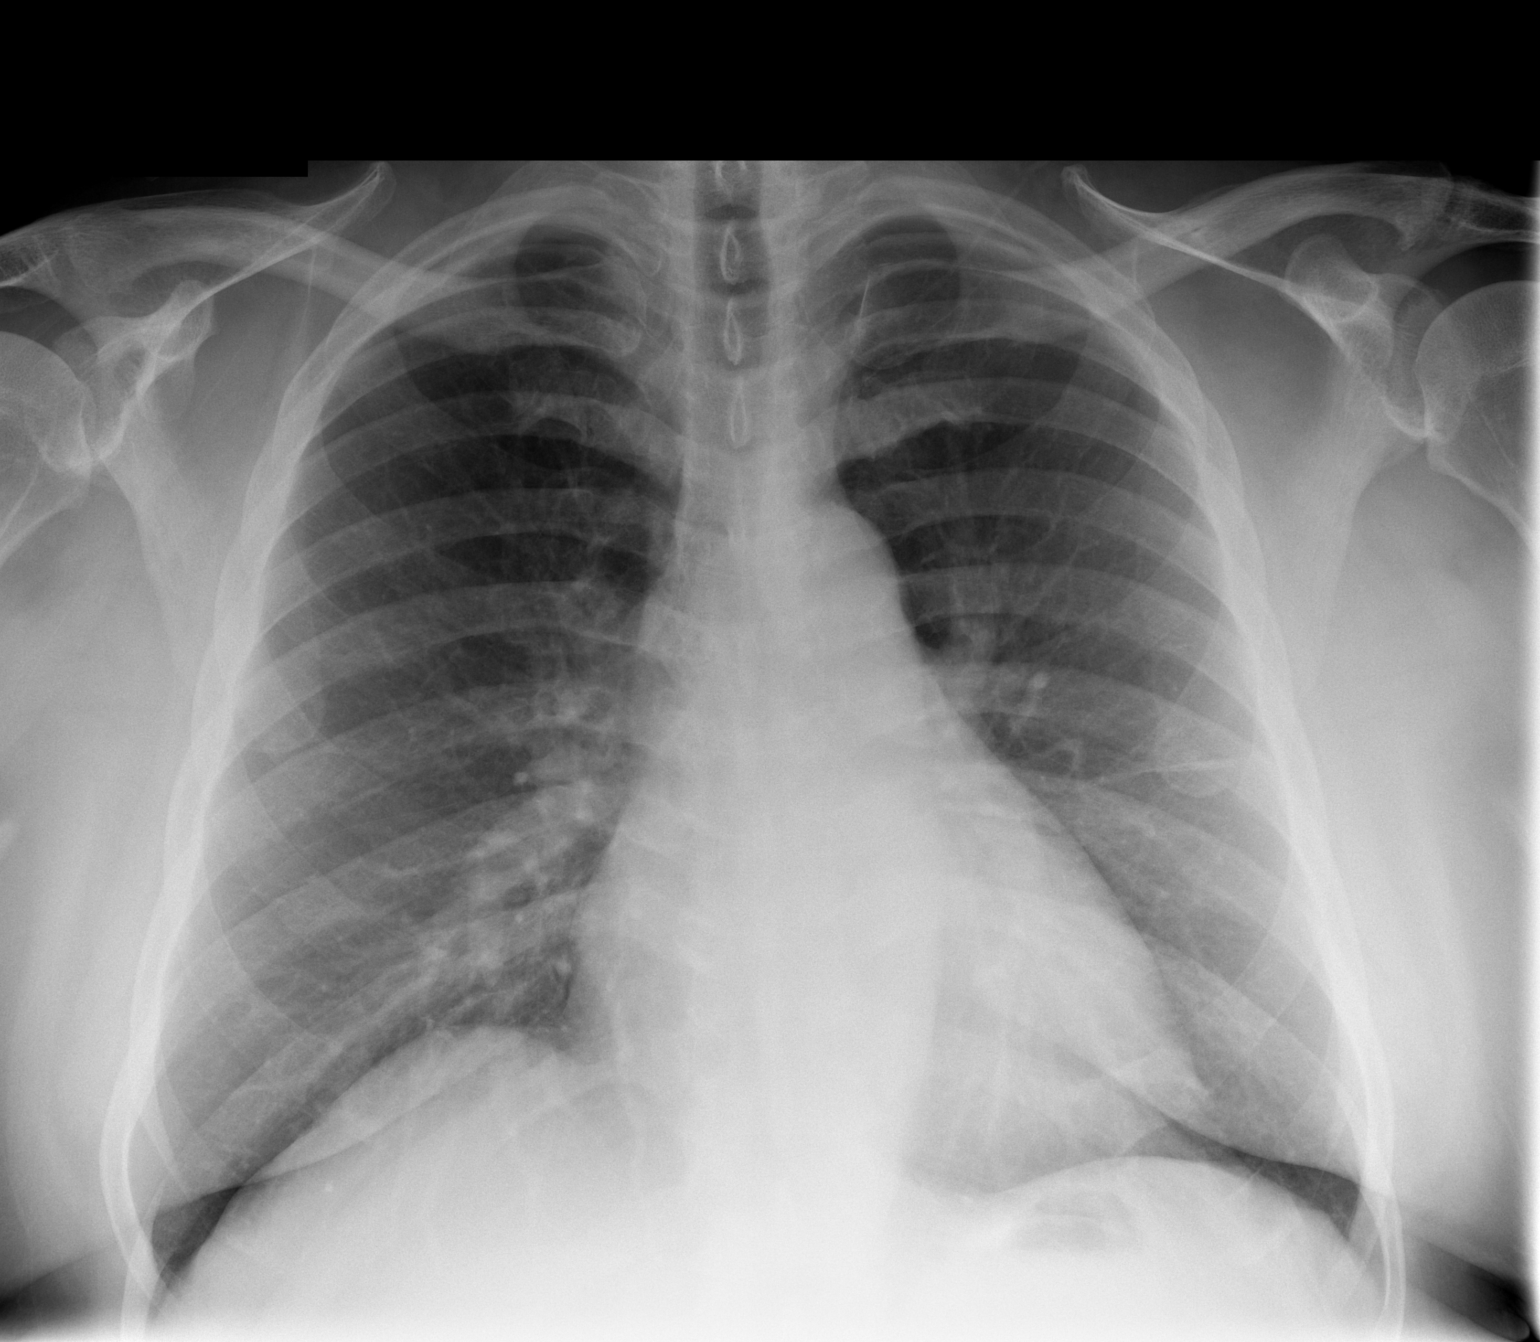

[w chest lat]
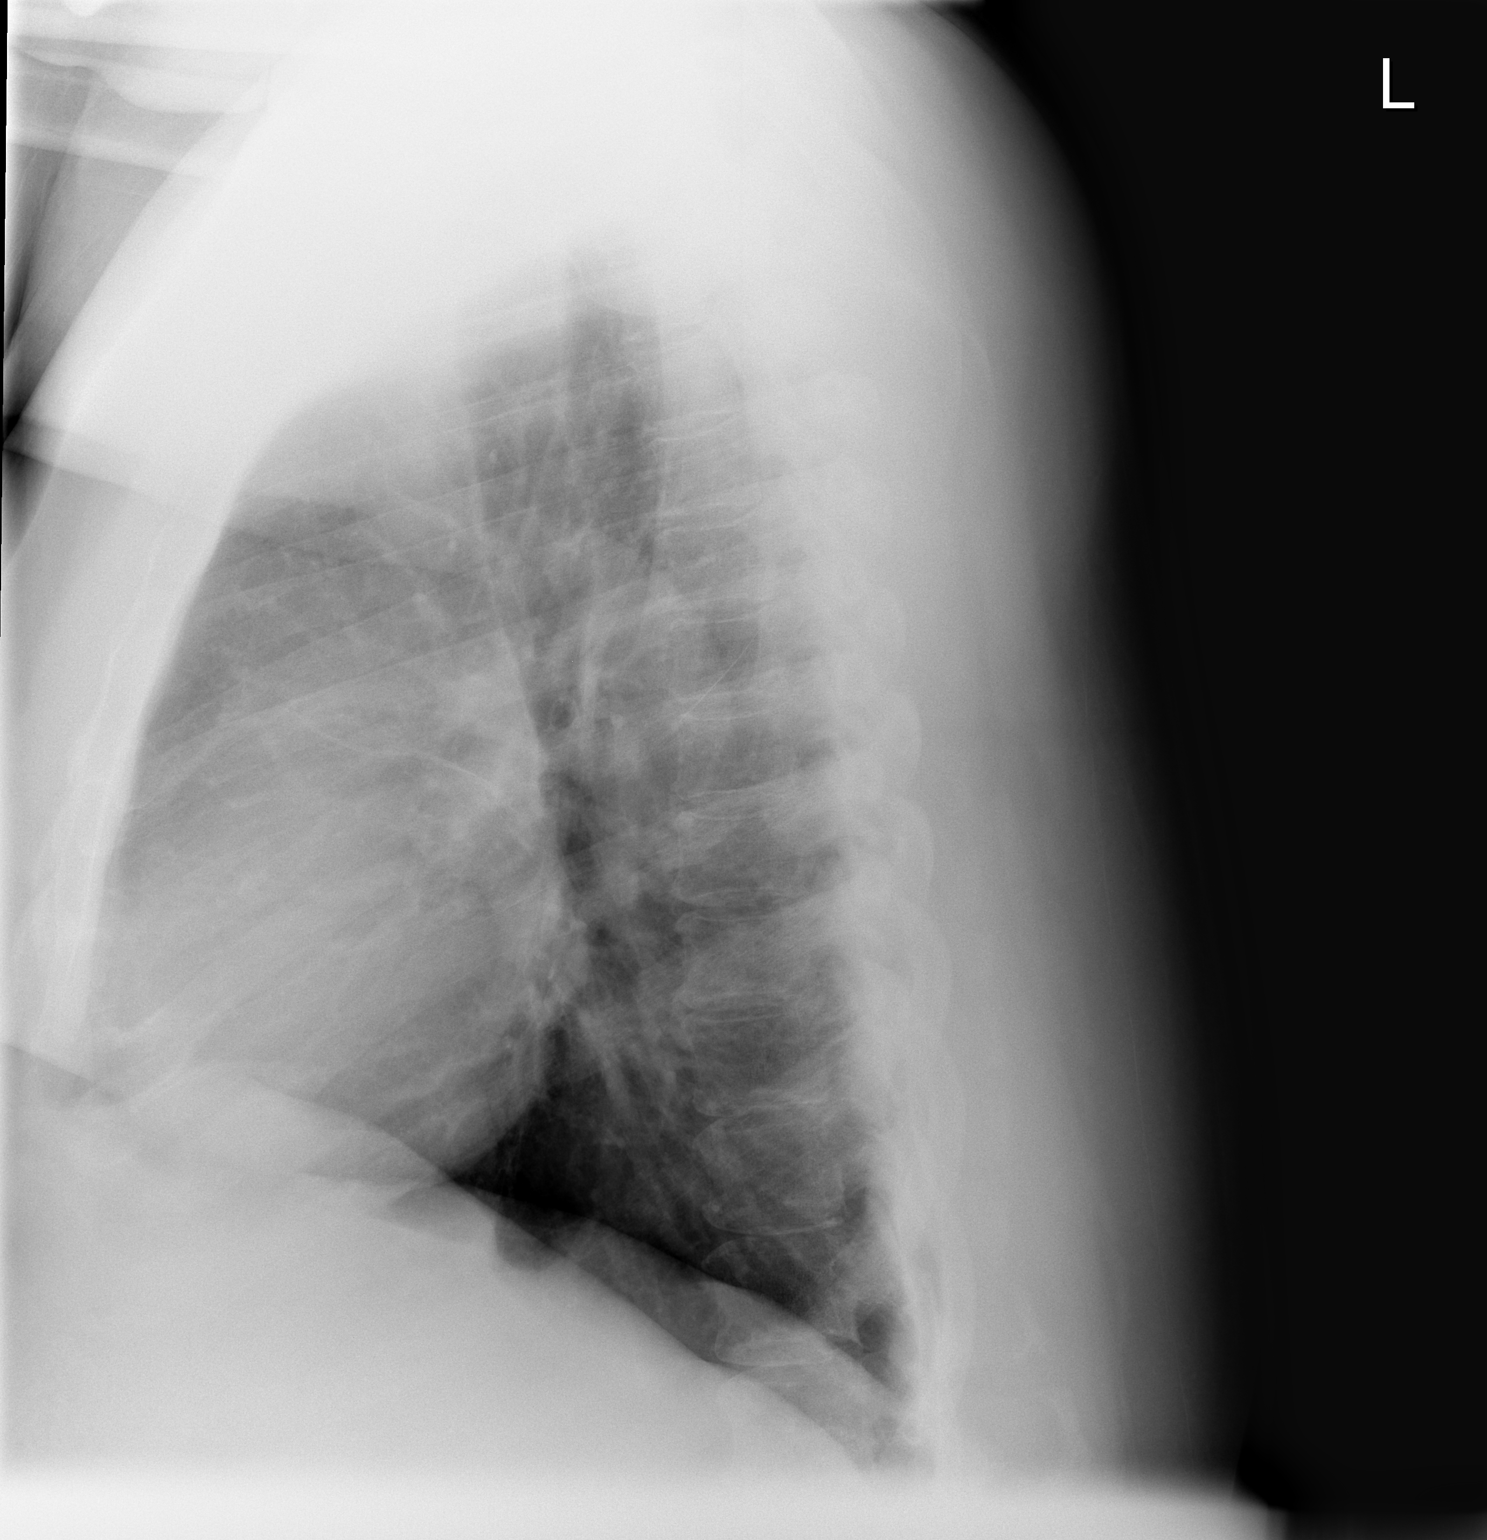

[2 of 2 positions shown; findings below may reference images not displayed]

FINDINGS: The lungs are clear.  Cardiac and mediastinal contours are normal.  Osseous structures unremarkable.
IMPRESSION: Negative for acute cardiac or pulmonary process.

## 2007-02-20 ENCOUNTER — Encounter: Admission: RE | Admit: 2007-02-20 | Discharge: 2007-02-20 | Payer: Self-pay | Admitting: Nephrology

## 2007-02-21 IMAGING — RF DG UGI W/ HIGH DENSITY W/O KUB
12 of 19 series · 15 of 24 positions shown · non-contrast
Comparison: none

CLINICAL DATA: Loss of appetite.  Dysphagia.  
UPPER GI:

[Series 1: run · 4 of 48 slices shown (1 of 12)]
[im 1/48]
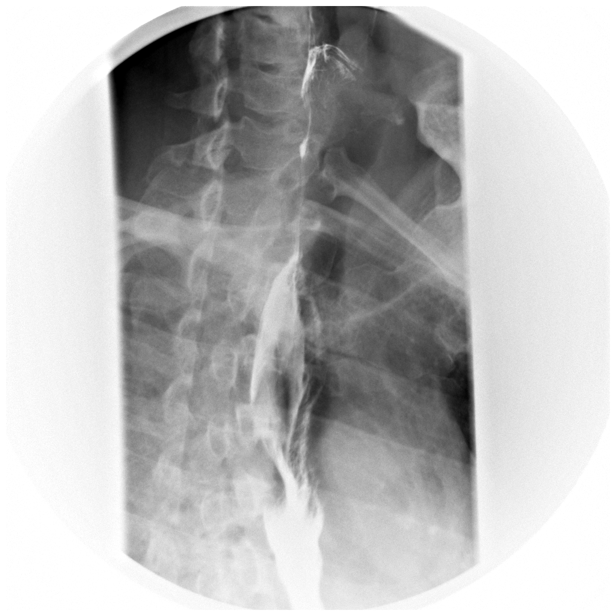
[im 19/48]
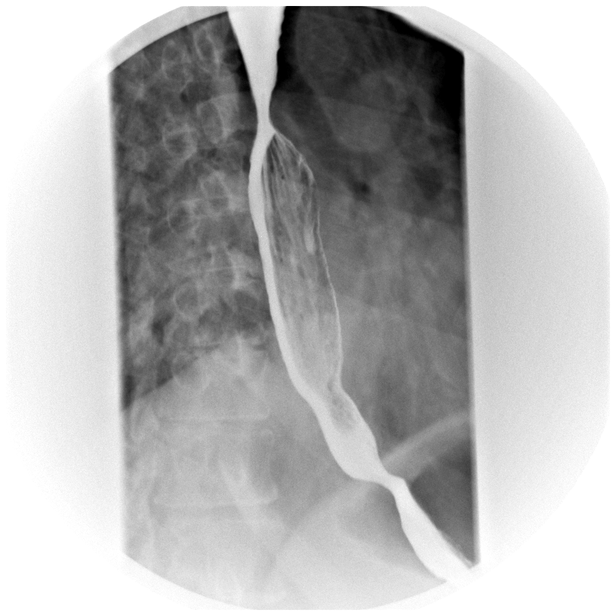
[im 38/48]
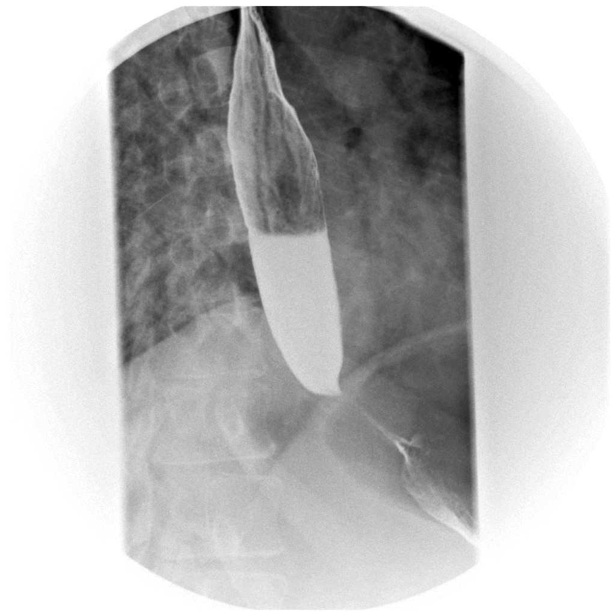
[im 48/48]
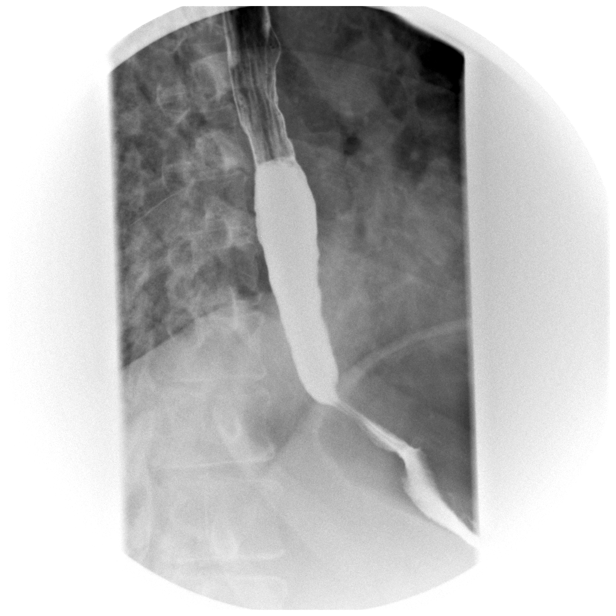

[Series 3: run · 1 of 1 slices shown (2 of 12)]
[im 1/1]
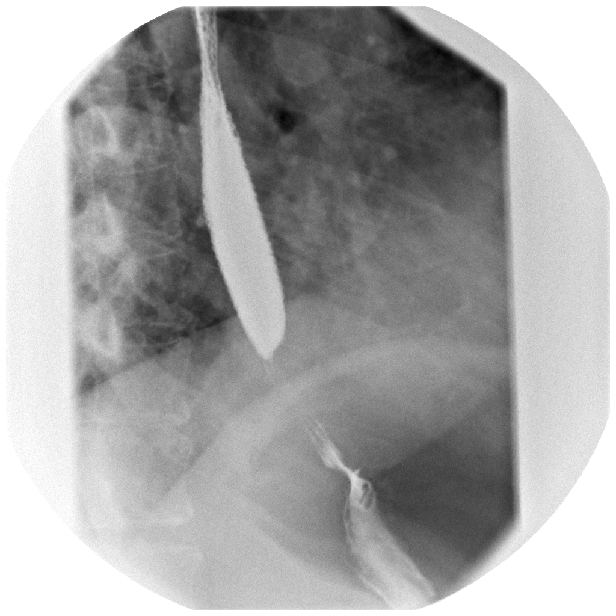

[Series 4: run · 1 of 1 slices shown (3 of 12)]
[im 1/1]
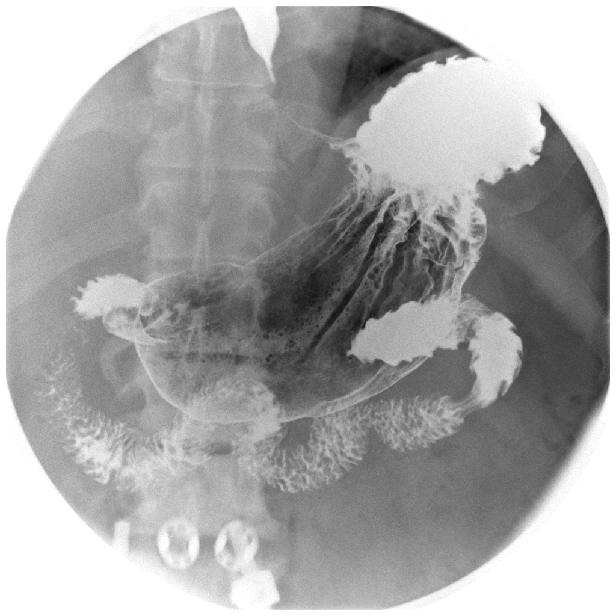

[Series 6: run · 1 of 1 slices shown (4 of 12)]
[im 1/1]
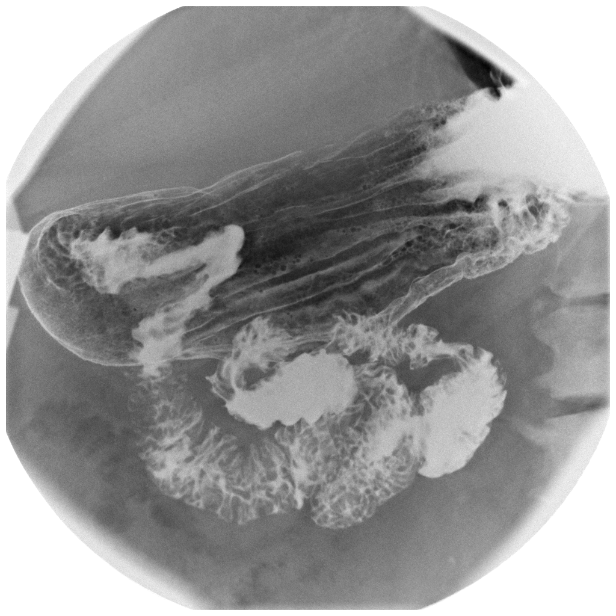

[Series 8: run · 1 of 1 slices shown (5 of 12)]
[im 1/1]
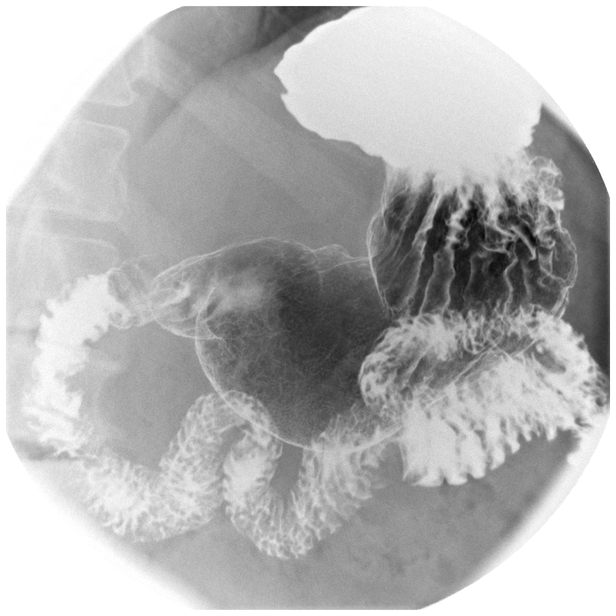

[Series 9: run · 1 of 1 slices shown (6 of 12)]
[im 1/1]
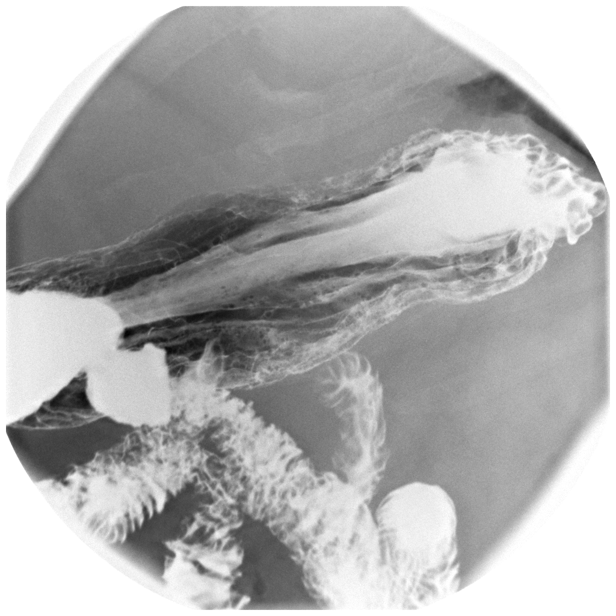

[Series 11: run · 1 of 1 slices shown (7 of 12)]
[im 1/1]
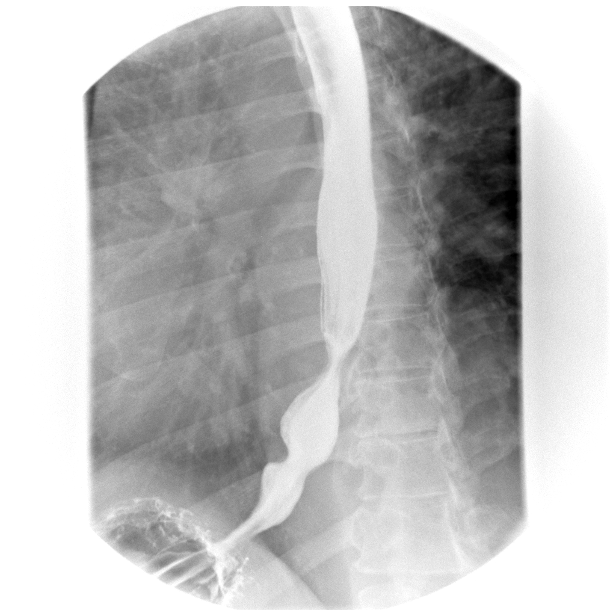

[Series 12: run · 1 of 1 slices shown (8 of 12)]
[im 1/1]
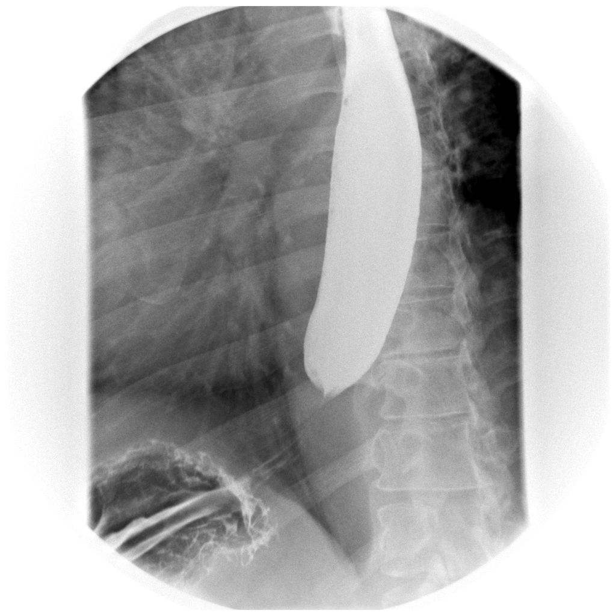

[Series 14: run · 1 of 1 slices shown (9 of 12)]
[im 1/1]
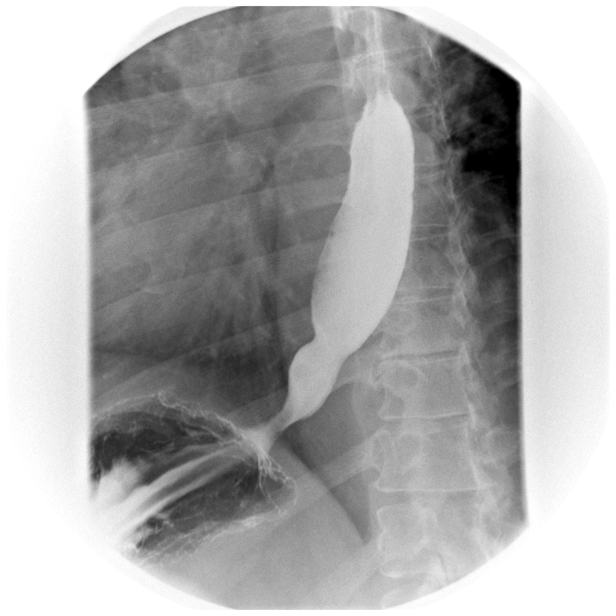

[Series 16: run · 1 of 1 slices shown (10 of 12)]
[im 1/1]
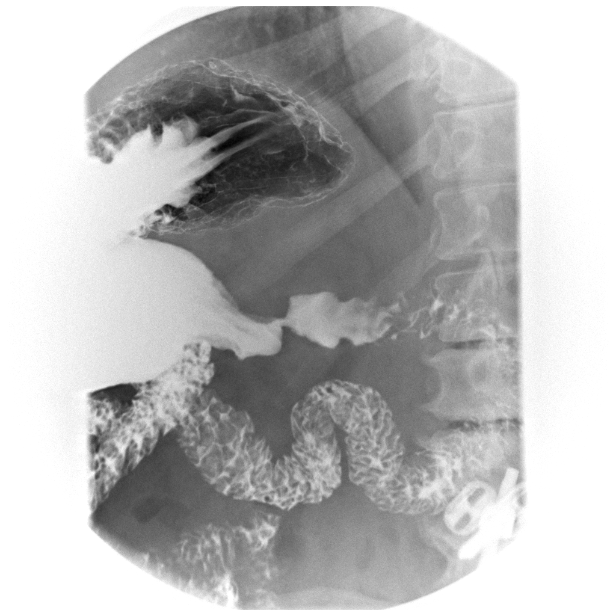

[Series 17: run · 1 of 1 slices shown (11 of 12)]
[im 1/1]
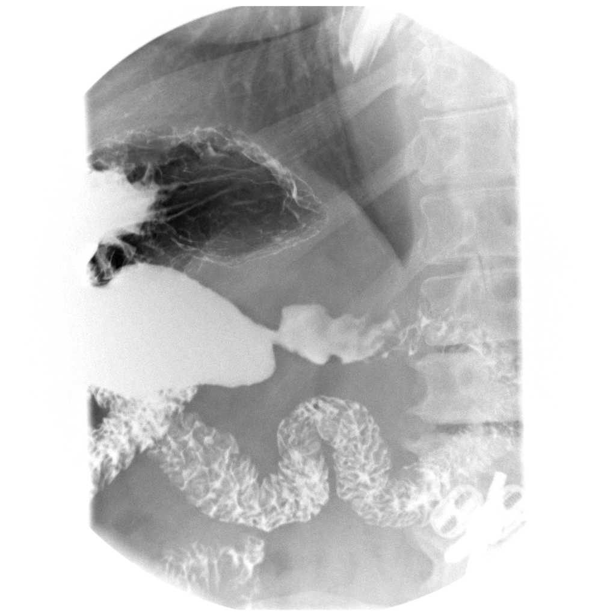

[Series 19: run · 1 of 1 slices shown (12 of 12)]
[im 1/1]
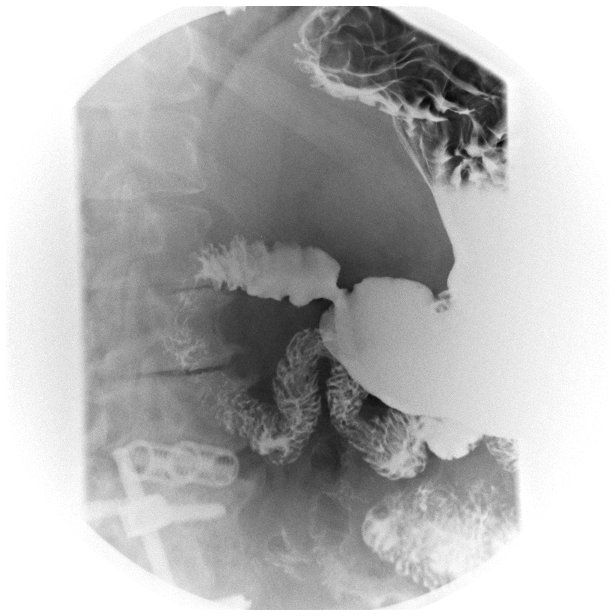

[15 of 24 positions shown; findings below may reference images not displayed]

FINDINGS: A high-density study is performed. The esophageal motility is diminished with poor clearing of the esophagus.  There was gastroesophageal reflux identified.  There is no stricture or hiatal hernia.  The stomach empties normally into the duodenum and without delay.  The duodenal bulb is normal.  There is no ulcer or mass.
IMPRESSION: Poor esophageal motility with gastroesophageal reflux.  No stricture, mass, or ulcer is identified.

## 2007-02-21 IMAGING — US US ABDOMEN COMPLETE
1 series · 14 of 25 positions shown · non-contrast
Comparison: Renal ultrasound dated 03/14/06.

CLINICAL DATA: Decreased appetite.  
 ABDOMEN ULTRASOUND:
TECHNIQUE: Complete abdominal ultrasound examination was performed including evaluation of the liver, gallbladder, bile ducts, pancreas, kidneys, spleen, IVC, and abdominal aorta.

[Series 1: unknown · 0.29mm/px · 14 of 93 slices shown]
[im 1/93]
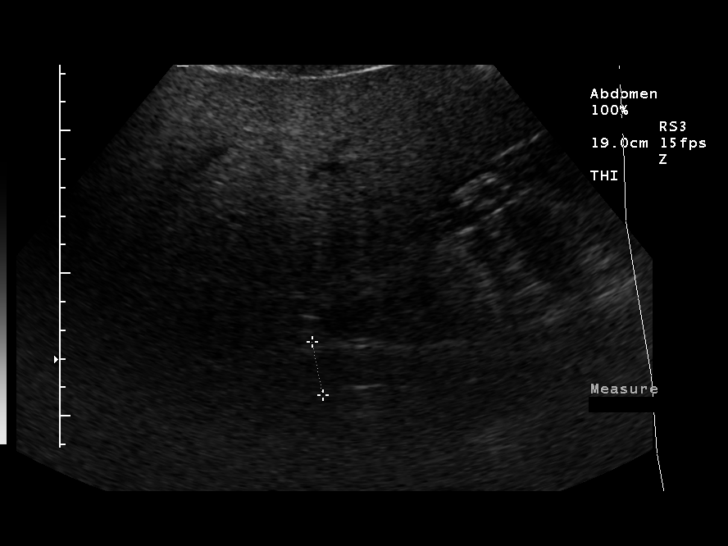
[im 8/93]
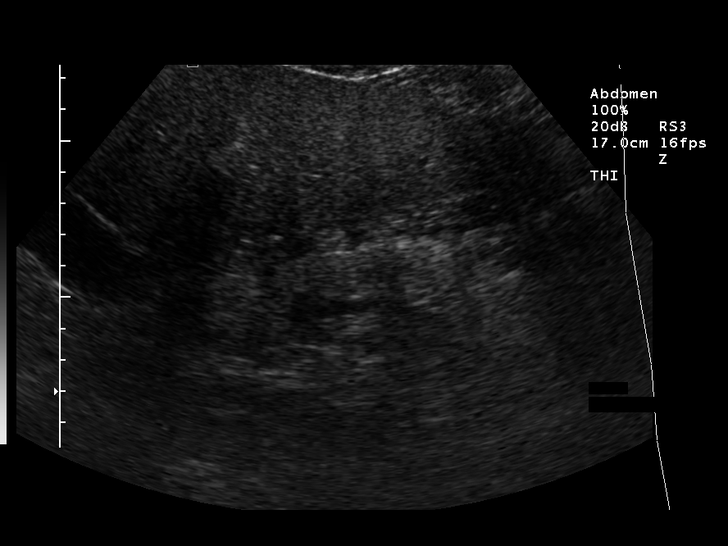
[im 16/93]
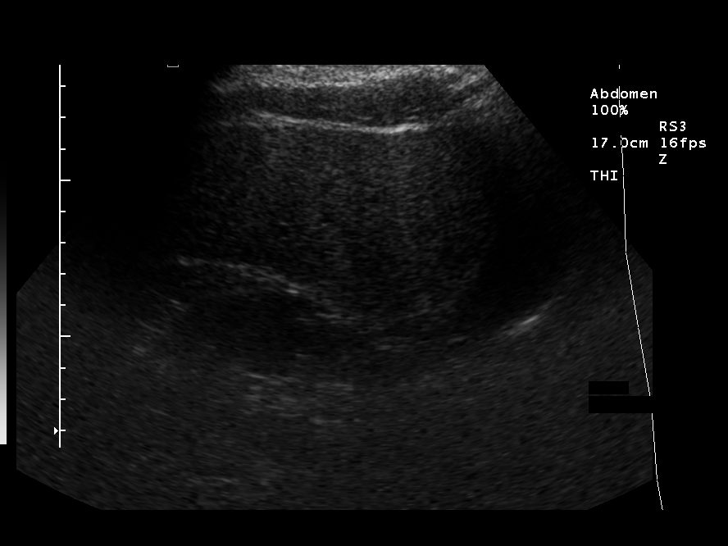
[im 24/93]
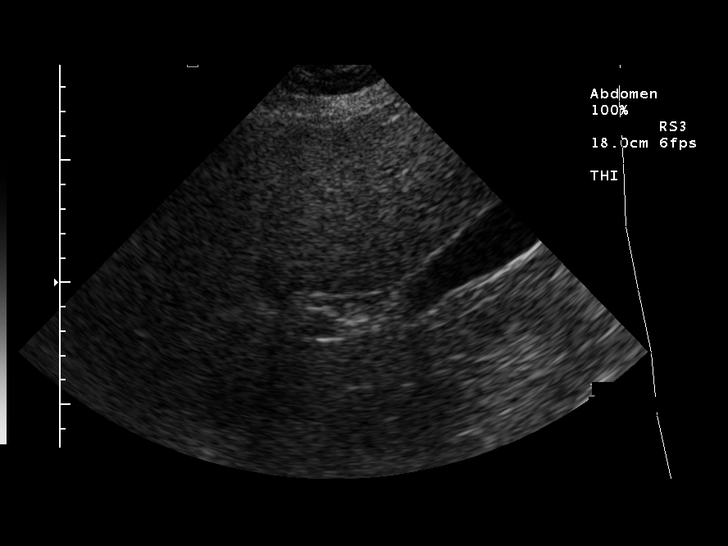
[im 31/93]
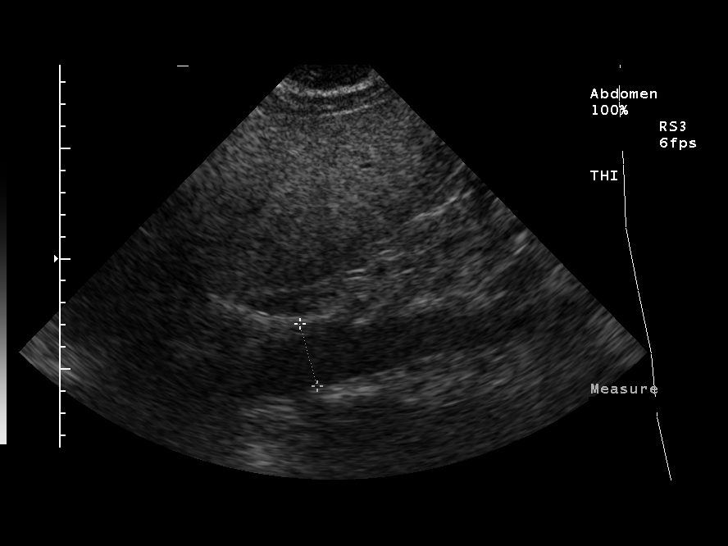
[im 35/93]
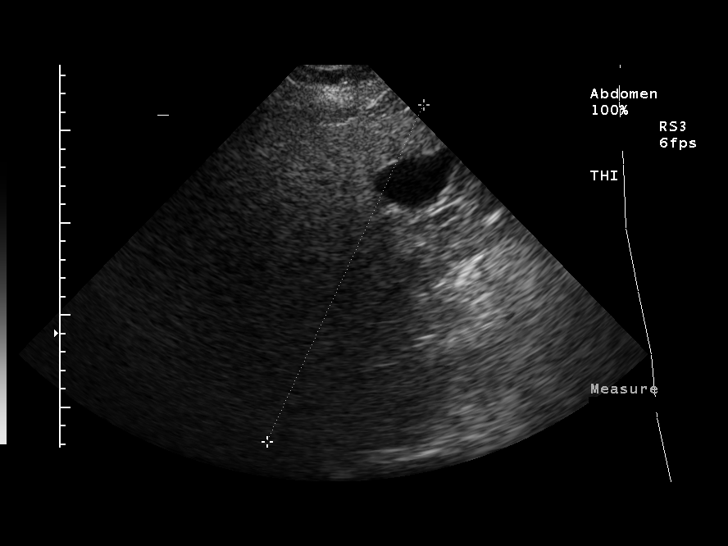
[im 43/93]
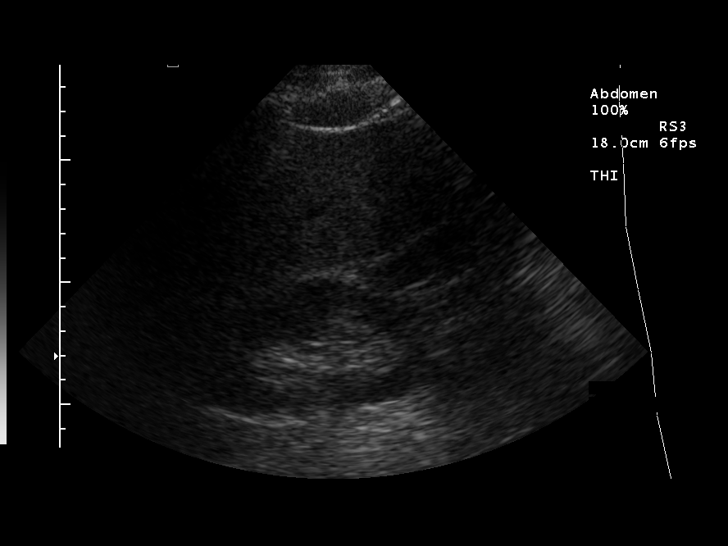
[im 50/93]
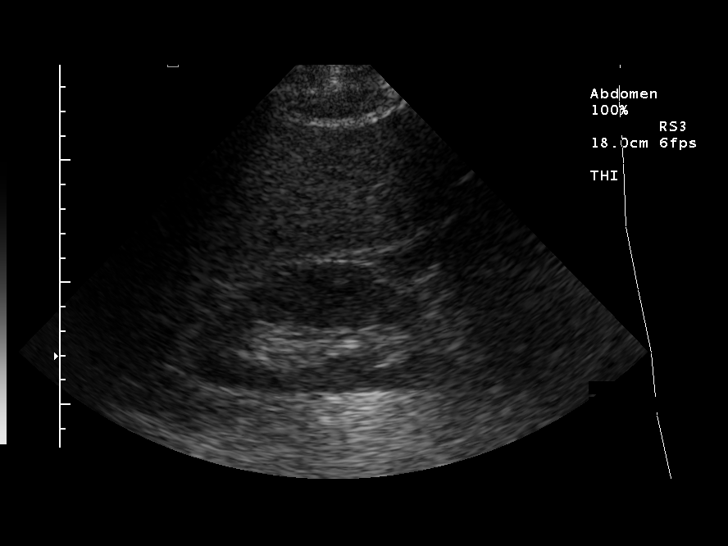
[im 58/93]
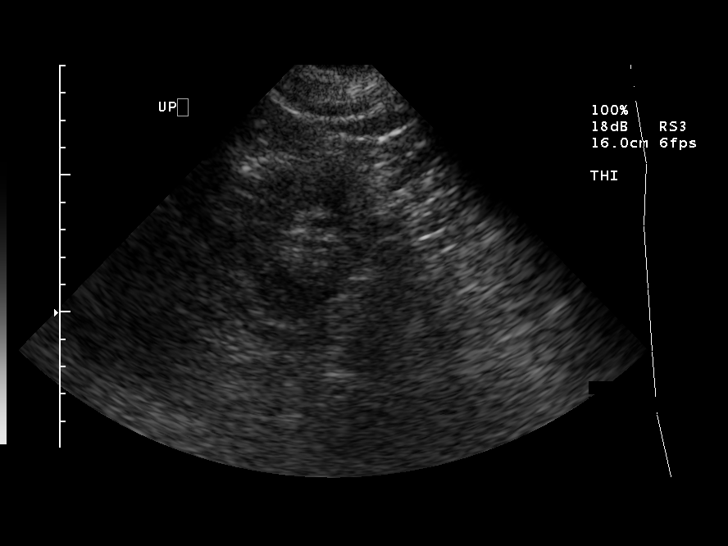
[im 62/93]
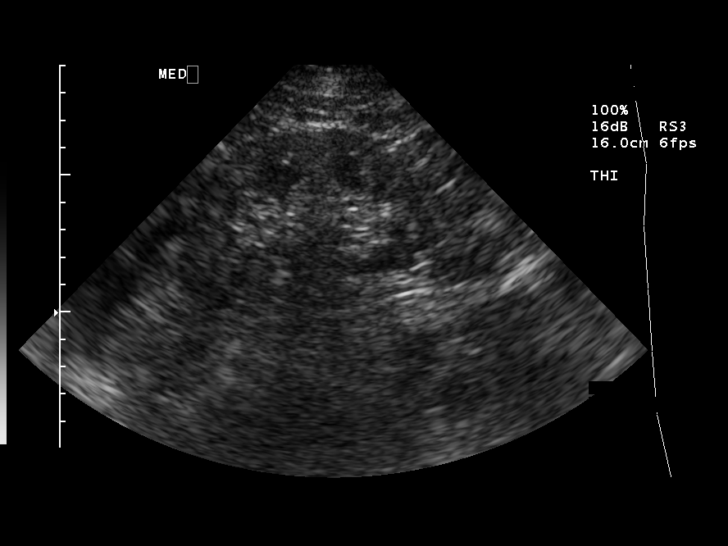
[im 70/93]
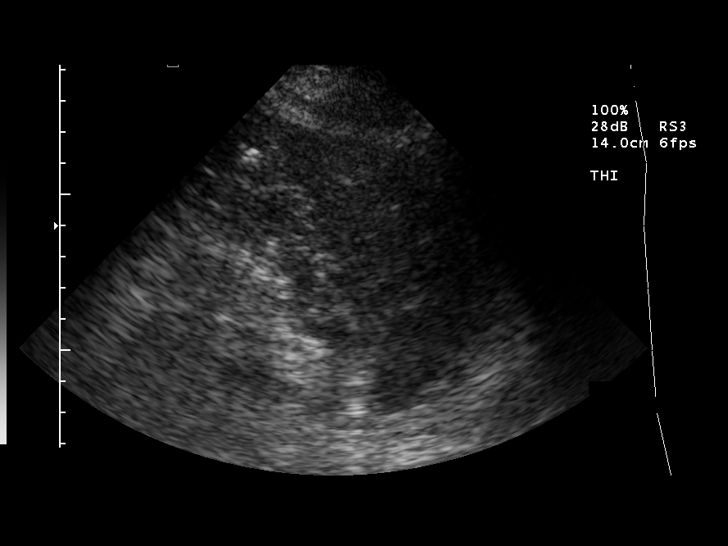
[im 77/93]
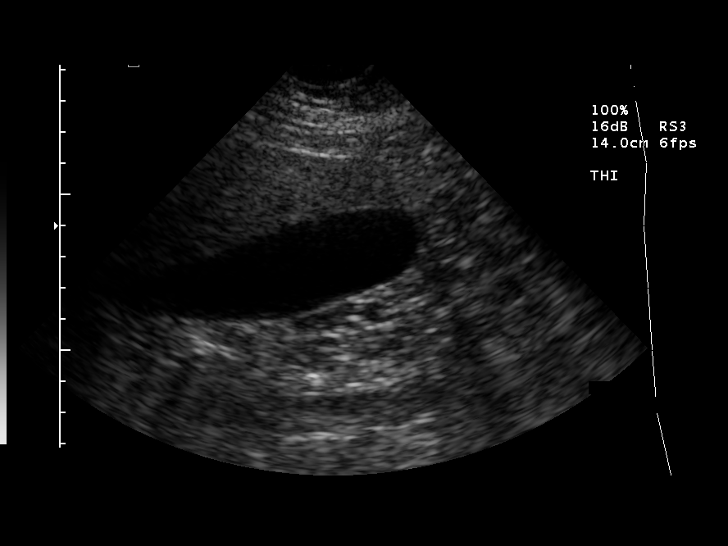
[im 85/93]
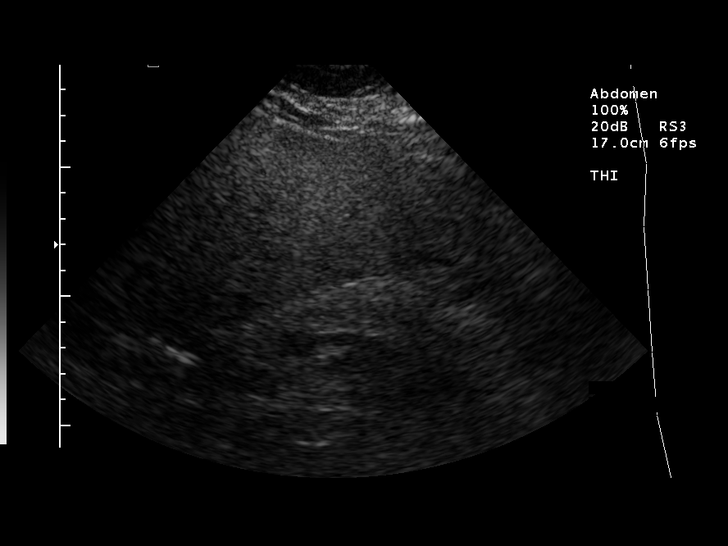
[im 93/93]
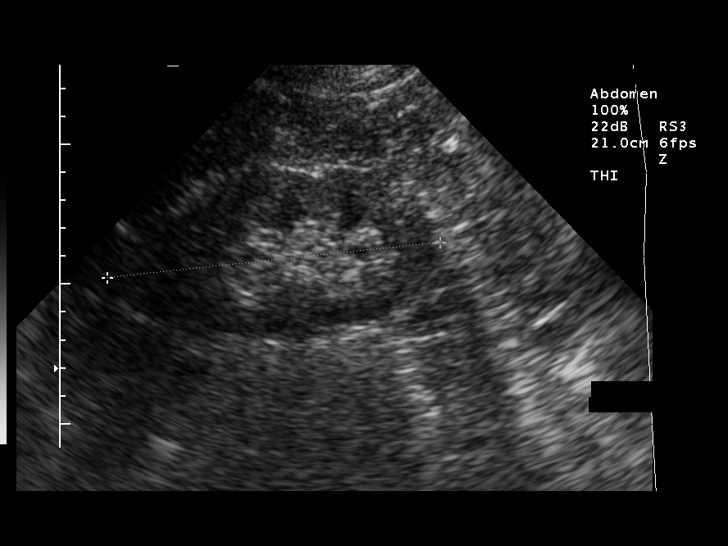

[14 of 25 positions shown; findings below may reference images not displayed]

Gallbladder:  Normal.  
 Common bile duct:  Normal caliber.  4.9 mm. 
 Liver:  Enlarged liver with no focal mass or evidence of intrahepatic biliary ductal dilatation. 
 IVC:  Normally patent.  
 Pancreas:  Normal. 
 Spleen:  Normal echotexture and size.  
 Kidneys:  Right 12.2 cm and left 12 cm.  Both have a normal sonographic appearance without evidence of obstruction.  When comparing right and left kidneys there is slight increased echogenicity of the left renal cortex compared to the right.  This may be on the basis of chronic disease. 
 Abdominal aorta:  Normal caliber.
IMPRESSION: Hepatomegaly.  Increased echogenicity of left renal cortex suggesting underlying chronic renal disease.

## 2007-03-05 ENCOUNTER — Encounter: Admission: RE | Admit: 2007-03-05 | Discharge: 2007-06-03 | Payer: Self-pay | Admitting: Nephrology

## 2007-04-03 ENCOUNTER — Observation Stay (HOSPITAL_COMMUNITY): Admission: EM | Admit: 2007-04-03 | Discharge: 2007-04-04 | Payer: Self-pay | Admitting: Family Medicine

## 2007-04-28 ENCOUNTER — Ambulatory Visit (HOSPITAL_COMMUNITY): Admission: RE | Admit: 2007-04-28 | Discharge: 2007-04-28 | Payer: Self-pay | Admitting: Cardiology

## 2007-05-13 ENCOUNTER — Ambulatory Visit: Payer: Self-pay | Admitting: Endocrinology

## 2007-05-27 ENCOUNTER — Ambulatory Visit: Payer: Self-pay | Admitting: Endocrinology

## 2007-05-27 LAB — CONVERTED CEMR LAB: Uric Acid, Serum: 5.8 mg/dL (ref 2.4–7.0)

## 2007-06-02 HISTORY — PX: CORONARY ANGIOPLASTY WITH STENT PLACEMENT: SHX49

## 2007-06-09 ENCOUNTER — Inpatient Hospital Stay (HOSPITAL_COMMUNITY): Admission: AD | Admit: 2007-06-09 | Discharge: 2007-06-16 | Payer: Self-pay | Admitting: Cardiology

## 2007-06-18 ENCOUNTER — Encounter: Payer: Self-pay | Admitting: Endocrinology

## 2007-06-18 DIAGNOSIS — K219 Gastro-esophageal reflux disease without esophagitis: Secondary | ICD-10-CM

## 2007-06-18 DIAGNOSIS — G609 Hereditary and idiopathic neuropathy, unspecified: Secondary | ICD-10-CM

## 2007-06-18 DIAGNOSIS — I110 Hypertensive heart disease with heart failure: Secondary | ICD-10-CM | POA: Insufficient documentation

## 2007-06-18 DIAGNOSIS — M109 Gout, unspecified: Secondary | ICD-10-CM

## 2007-06-18 DIAGNOSIS — E785 Hyperlipidemia, unspecified: Secondary | ICD-10-CM | POA: Insufficient documentation

## 2007-06-18 DIAGNOSIS — I509 Heart failure, unspecified: Secondary | ICD-10-CM | POA: Insufficient documentation

## 2007-06-18 DIAGNOSIS — G629 Polyneuropathy, unspecified: Secondary | ICD-10-CM | POA: Insufficient documentation

## 2007-06-18 DIAGNOSIS — I10 Essential (primary) hypertension: Secondary | ICD-10-CM

## 2007-06-18 HISTORY — DX: Hereditary and idiopathic neuropathy, unspecified: G60.9

## 2007-06-18 HISTORY — DX: Essential (primary) hypertension: I10

## 2007-06-18 HISTORY — DX: Hyperlipidemia, unspecified: E78.5

## 2007-06-18 HISTORY — DX: Gout, unspecified: M10.9

## 2007-06-18 HISTORY — DX: Gastro-esophageal reflux disease without esophagitis: K21.9

## 2007-06-19 ENCOUNTER — Ambulatory Visit: Payer: Self-pay | Admitting: Endocrinology

## 2007-07-17 ENCOUNTER — Inpatient Hospital Stay (HOSPITAL_COMMUNITY): Admission: EM | Admit: 2007-07-17 | Discharge: 2007-07-23 | Payer: Self-pay | Admitting: Emergency Medicine

## 2007-07-17 ENCOUNTER — Ambulatory Visit: Payer: Self-pay | Admitting: Internal Medicine

## 2007-07-17 ENCOUNTER — Telehealth: Payer: Self-pay | Admitting: Endocrinology

## 2007-07-23 ENCOUNTER — Ambulatory Visit: Payer: Self-pay | Admitting: Family

## 2007-07-23 ENCOUNTER — Encounter: Payer: Self-pay | Admitting: Internal Medicine

## 2007-07-31 ENCOUNTER — Ambulatory Visit: Payer: Self-pay | Admitting: Endocrinology

## 2007-07-31 LAB — CONVERTED CEMR LAB: Hgb A1c MFr Bld: 7.2 % — ABNORMAL HIGH (ref 4.6–6.0)

## 2007-08-03 ENCOUNTER — Encounter: Payer: Self-pay | Admitting: Endocrinology

## 2007-08-06 ENCOUNTER — Encounter: Admission: RE | Admit: 2007-08-06 | Discharge: 2007-08-22 | Payer: Self-pay | Admitting: Internal Medicine

## 2007-08-06 ENCOUNTER — Encounter: Payer: Self-pay | Admitting: Endocrinology

## 2007-08-08 ENCOUNTER — Encounter: Payer: Self-pay | Admitting: Endocrinology

## 2007-08-08 ENCOUNTER — Encounter: Admission: RE | Admit: 2007-08-08 | Discharge: 2007-08-08 | Payer: Self-pay | Admitting: Rheumatology

## 2007-09-03 ENCOUNTER — Encounter: Payer: Self-pay | Admitting: Endocrinology

## 2007-09-08 ENCOUNTER — Encounter: Payer: Self-pay | Admitting: Endocrinology

## 2007-09-09 ENCOUNTER — Encounter: Payer: Self-pay | Admitting: Endocrinology

## 2007-09-24 ENCOUNTER — Encounter: Payer: Self-pay | Admitting: Endocrinology

## 2007-10-02 HISTORY — PX: ARTERIOVENOUS GRAFT PLACEMENT: SUR1029

## 2007-10-30 ENCOUNTER — Inpatient Hospital Stay (HOSPITAL_COMMUNITY): Admission: AD | Admit: 2007-10-30 | Discharge: 2007-11-10 | Payer: Self-pay | Admitting: Neurosurgery

## 2007-11-02 HISTORY — PX: CERVICAL SPINE SURGERY: SHX589

## 2007-11-03 ENCOUNTER — Encounter: Payer: Self-pay | Admitting: Neurosurgery

## 2007-11-28 ENCOUNTER — Ambulatory Visit: Payer: Self-pay | Admitting: Endocrinology

## 2007-11-28 ENCOUNTER — Inpatient Hospital Stay (HOSPITAL_COMMUNITY): Admission: EM | Admit: 2007-11-28 | Discharge: 2007-12-02 | Payer: Self-pay | Admitting: Emergency Medicine

## 2007-11-28 LAB — CONVERTED CEMR LAB
Hgb A1c MFr Bld: 7.5 % — ABNORMAL HIGH (ref 4.6–6.0)
TSH: 1.27 microintl units/mL (ref 0.35–5.50)

## 2007-11-28 IMAGING — CR DG LUMBAR SPINE COMPLETE 4+V
5 series · 5 of 5 positions shown · non-contrast
Comparison: 04/05/04.

CLINICAL DATA: 55 year old with back pain. 
 LUMBAR SPINE FIVE VIEWS:

[t l-spine a.p.]
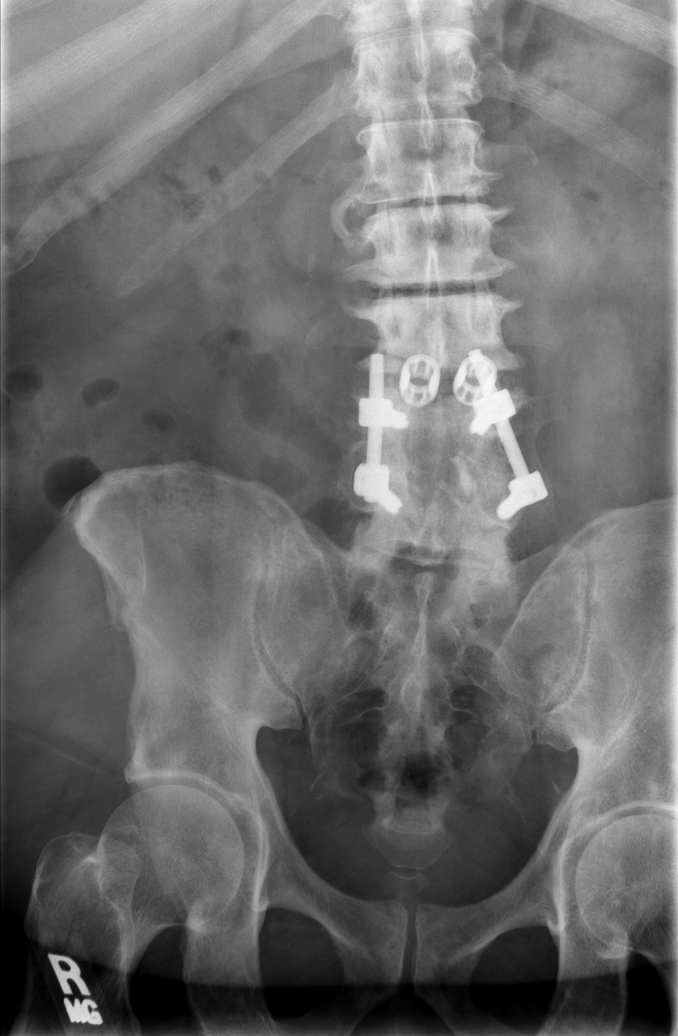

[t l-spine oblique exposure (1 of 2)]
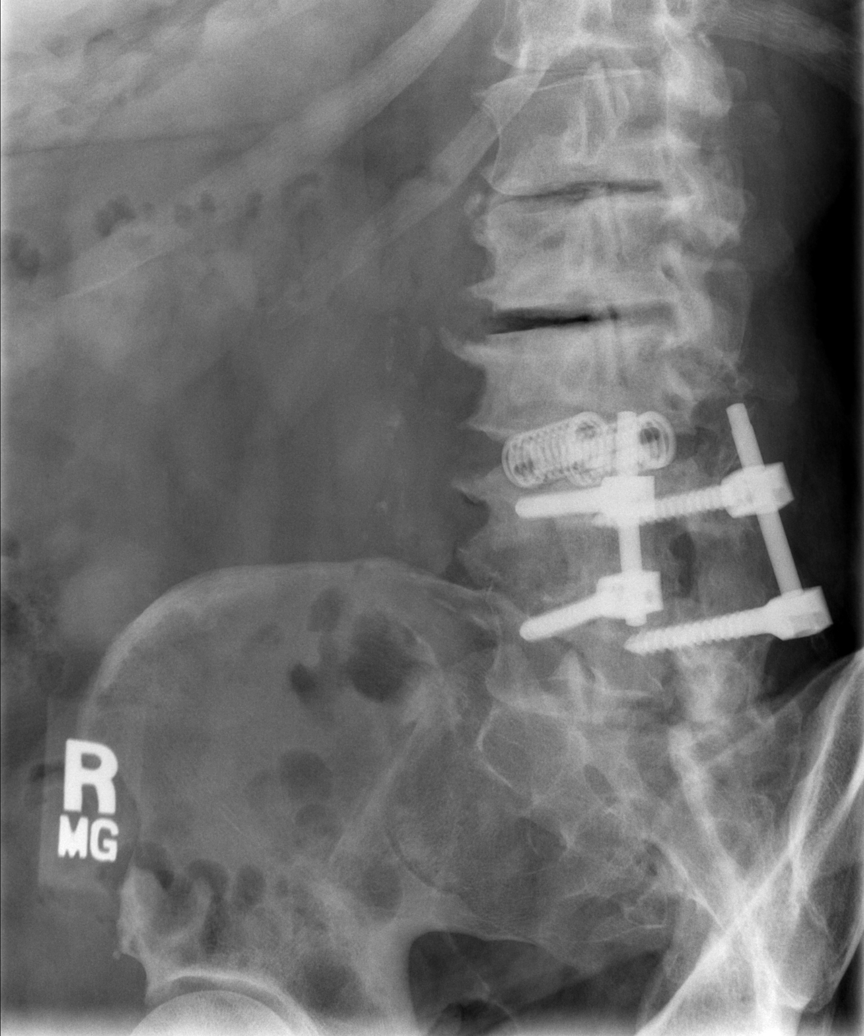

[t l-spine oblique exposure (2 of 2)]
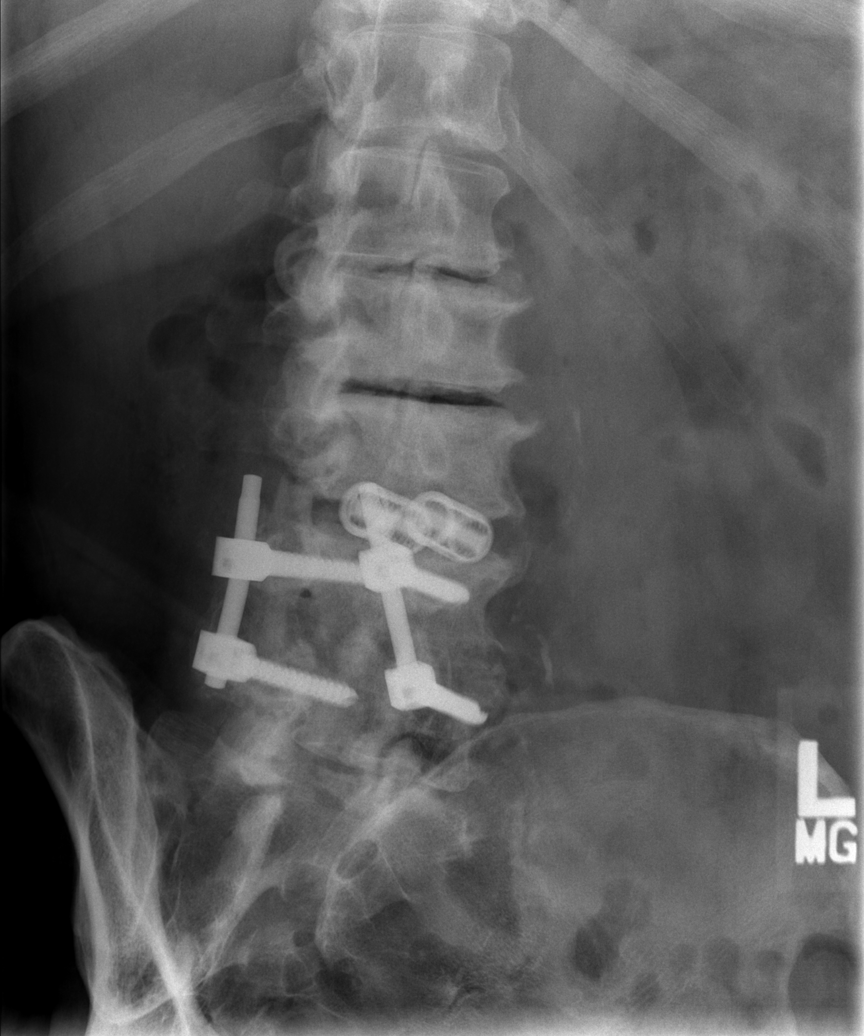

[t l-spine lat]
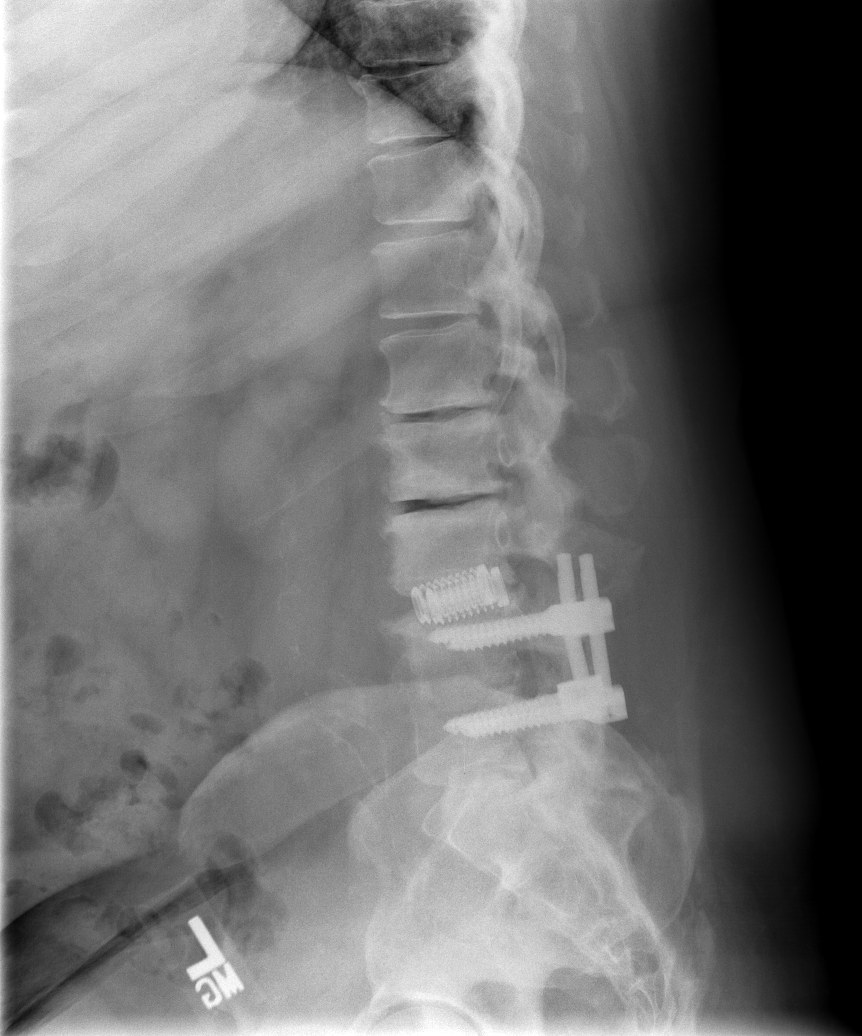

[t l-spine l5-s1 spot]
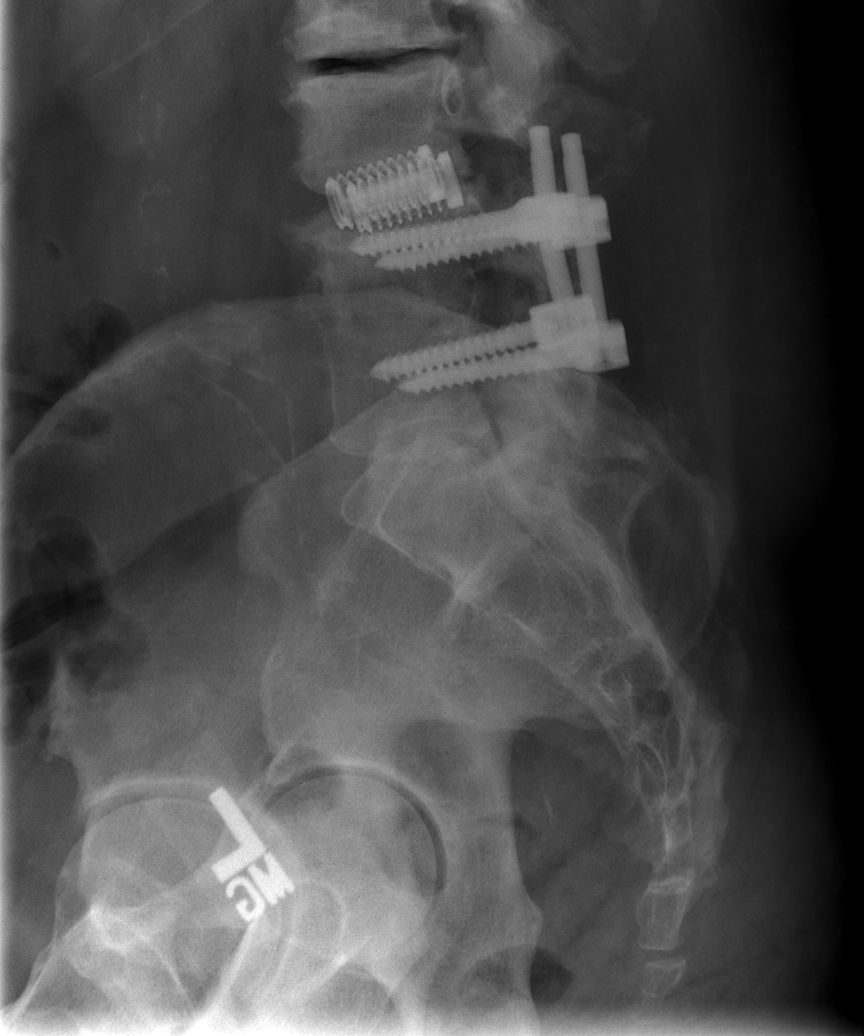

[5 of 5 positions shown; findings below may reference images not displayed]

FINDINGS: Ray cage fusion noted at L3-4.  There are also pedicle screws and posterior rods and interbody bone plug which are fusing L4-5.  Stable mild disc space narrowing at L5-S1.  There is also stable degenerative disc disease at L1-2 and L2-3 with vacuum disc phenomenon.  No acute bony findings.   Stable sclerosis at L1-2 and L2-3. 
 Aortic and iliac vascular calcifications.  SI joints are intact.  Pubic symphysis appears normal.  Both hips are normally located.
IMPRESSION: 1.  Advanced degenerative disc disease at L1-2 and L2-3 with disc space narrowing, discogenic sclerosis, and vacuum disc phenomenon. 
 2.  Ray cage fusion at L3-4 appears stable. 
 3.  Posterior fusion at L4-5.  There is mild lucency around the left L5 pedicle screw which may suggest mild loosening.

## 2007-12-08 ENCOUNTER — Ambulatory Visit: Payer: Self-pay | Admitting: Internal Medicine

## 2007-12-08 ENCOUNTER — Encounter: Admission: RE | Admit: 2007-12-08 | Discharge: 2007-12-08 | Payer: Self-pay | Admitting: Endocrinology

## 2007-12-12 ENCOUNTER — Ambulatory Visit: Payer: Self-pay | Admitting: Endocrinology

## 2007-12-12 IMAGING — CR DG WRIST COMPLETE 3+V*L*
4 series · 4 of 4 positions shown · non-contrast
Comparison: None.

CLINICAL DATA: 65 year old with left wrist pain.  No known injury.

 LEFT WRIST - 4 VIEW:

[view not recorded (1 of 4)]
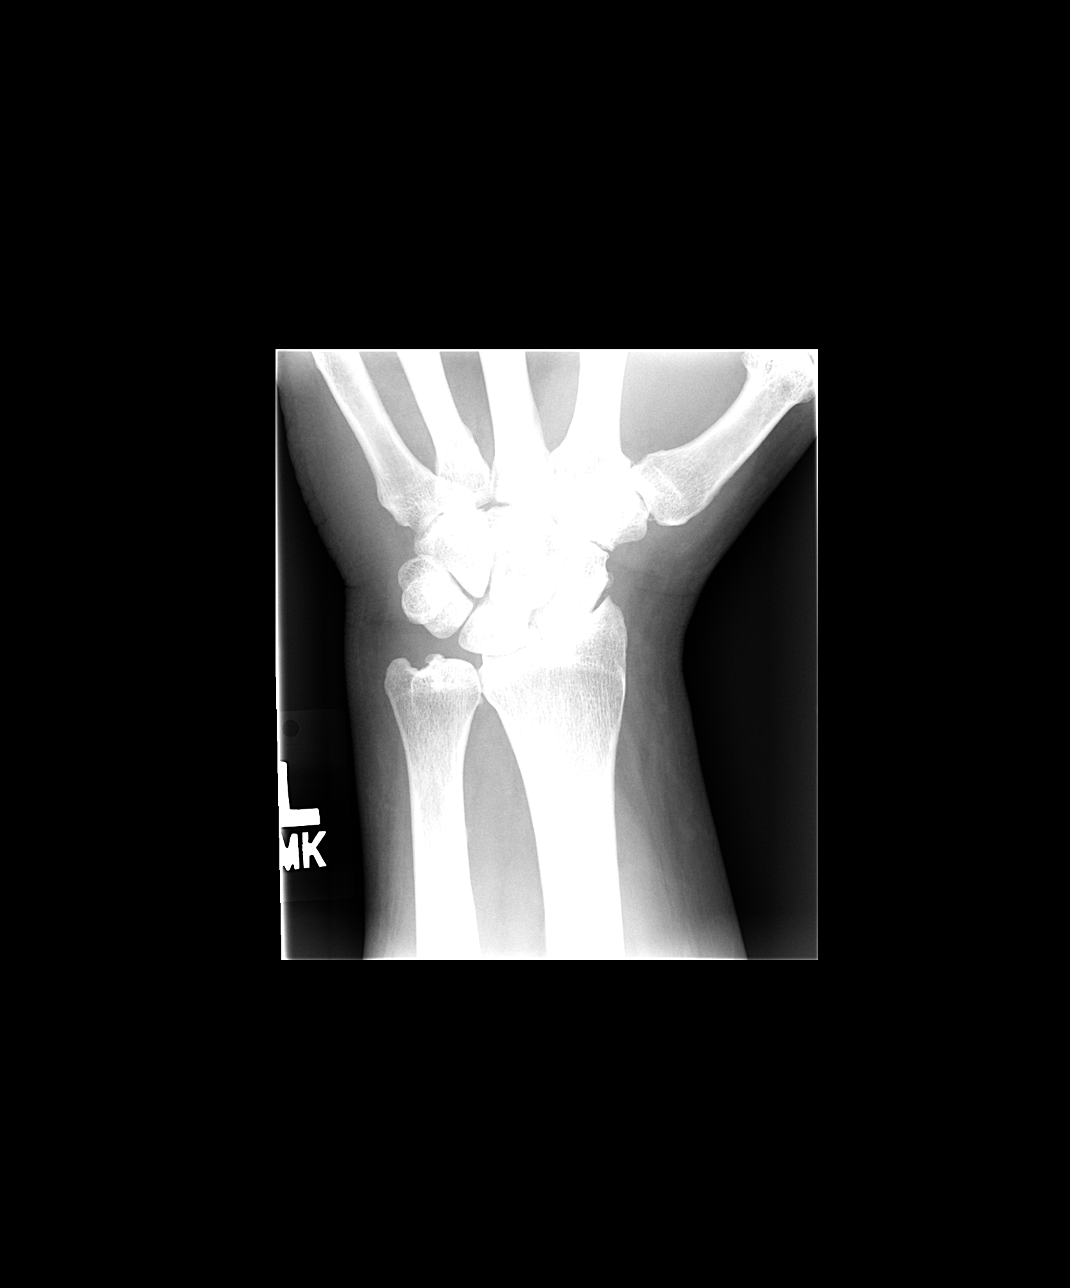

[view not recorded (2 of 4)]
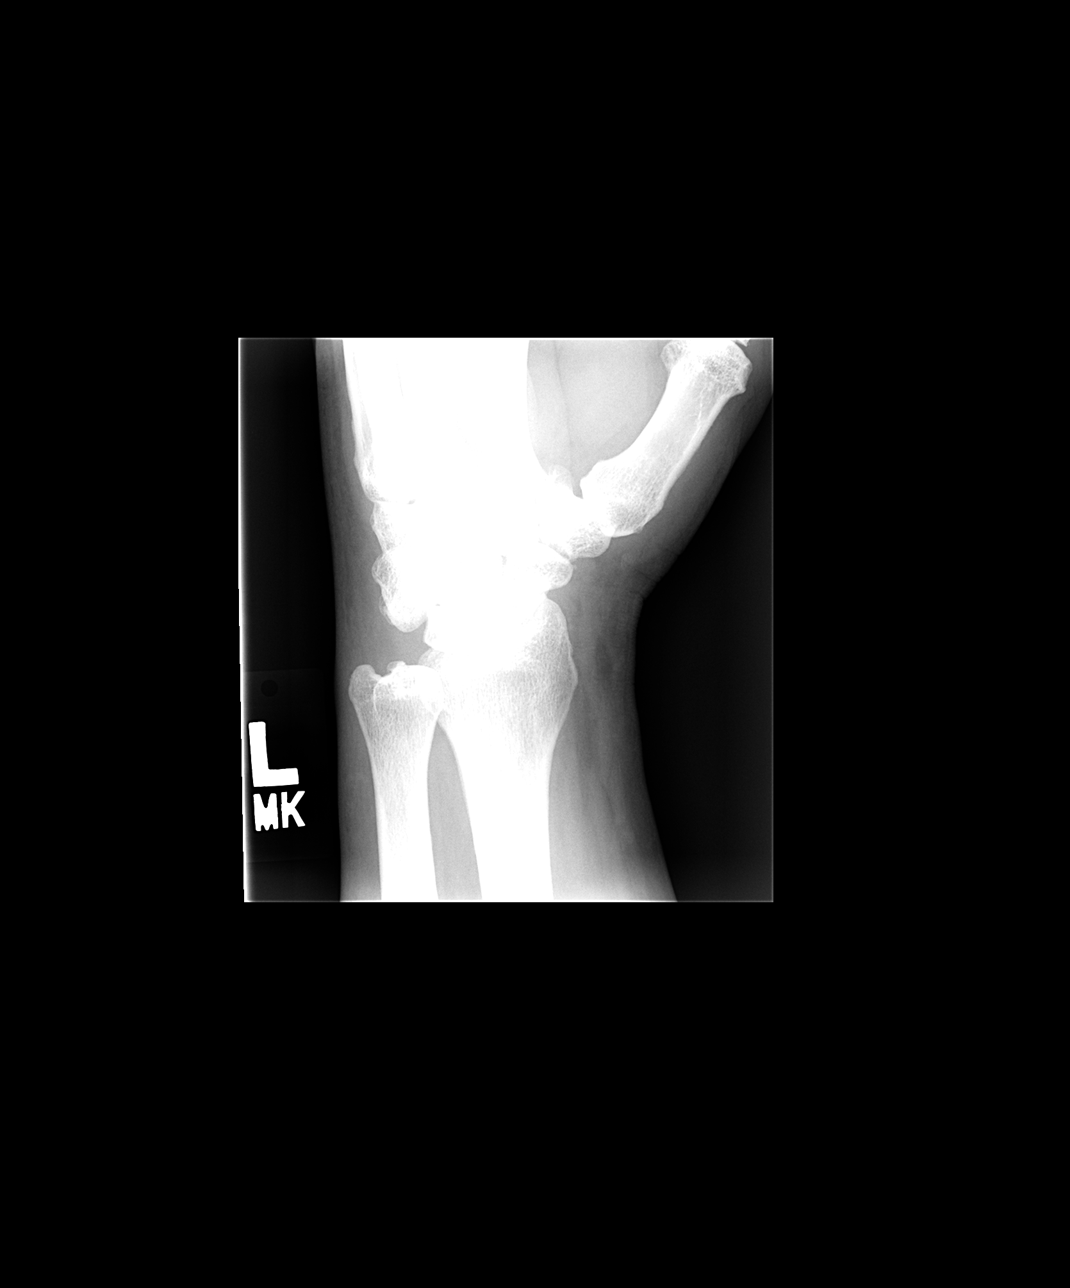

[view not recorded (3 of 4)]
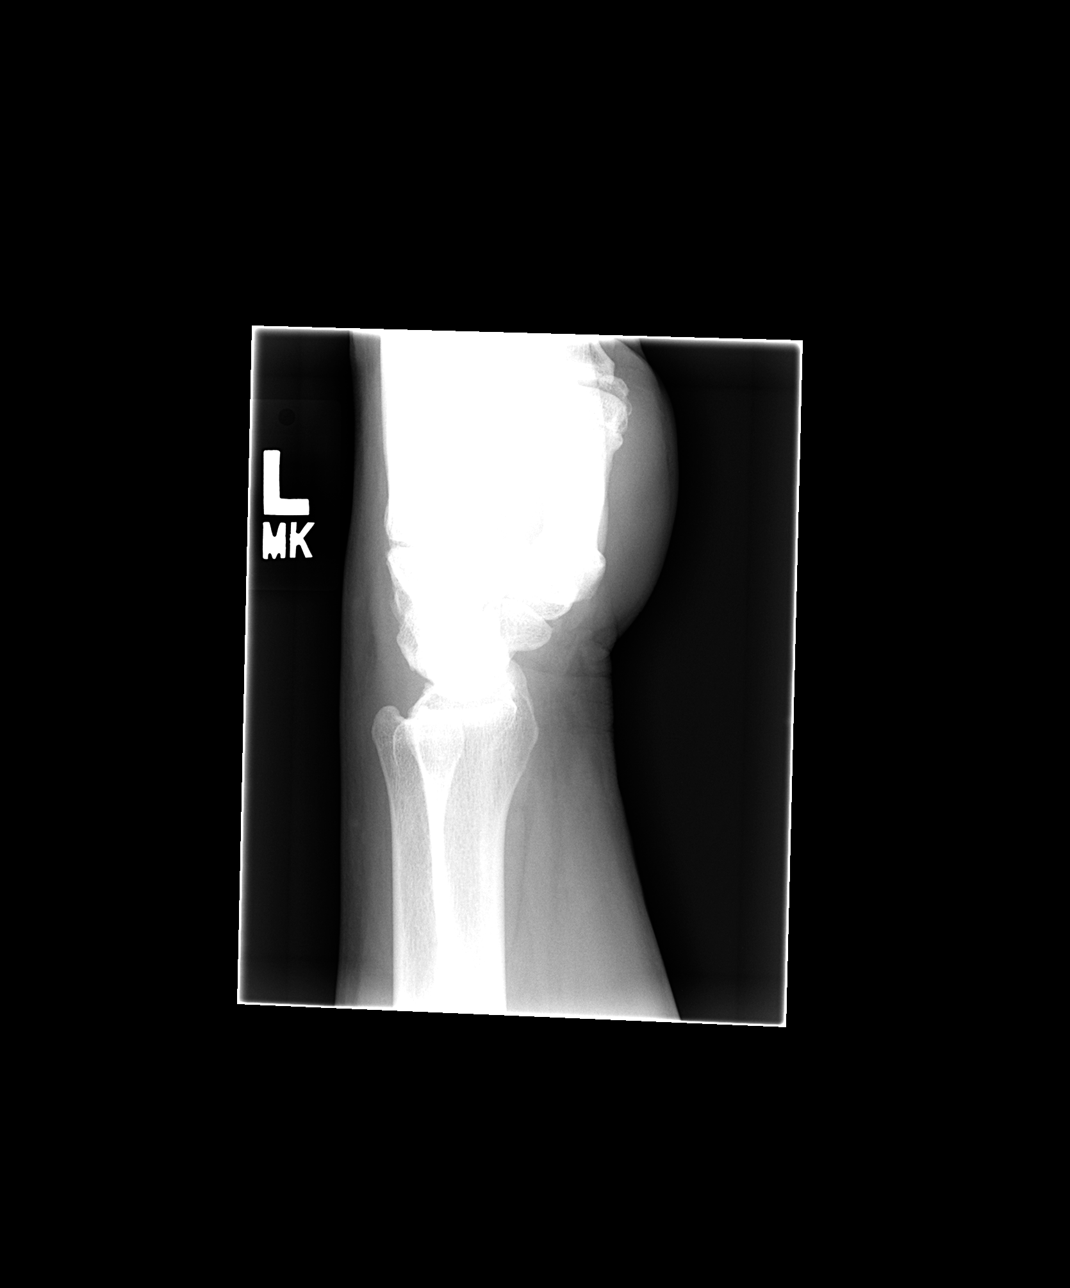

[view not recorded (4 of 4)]
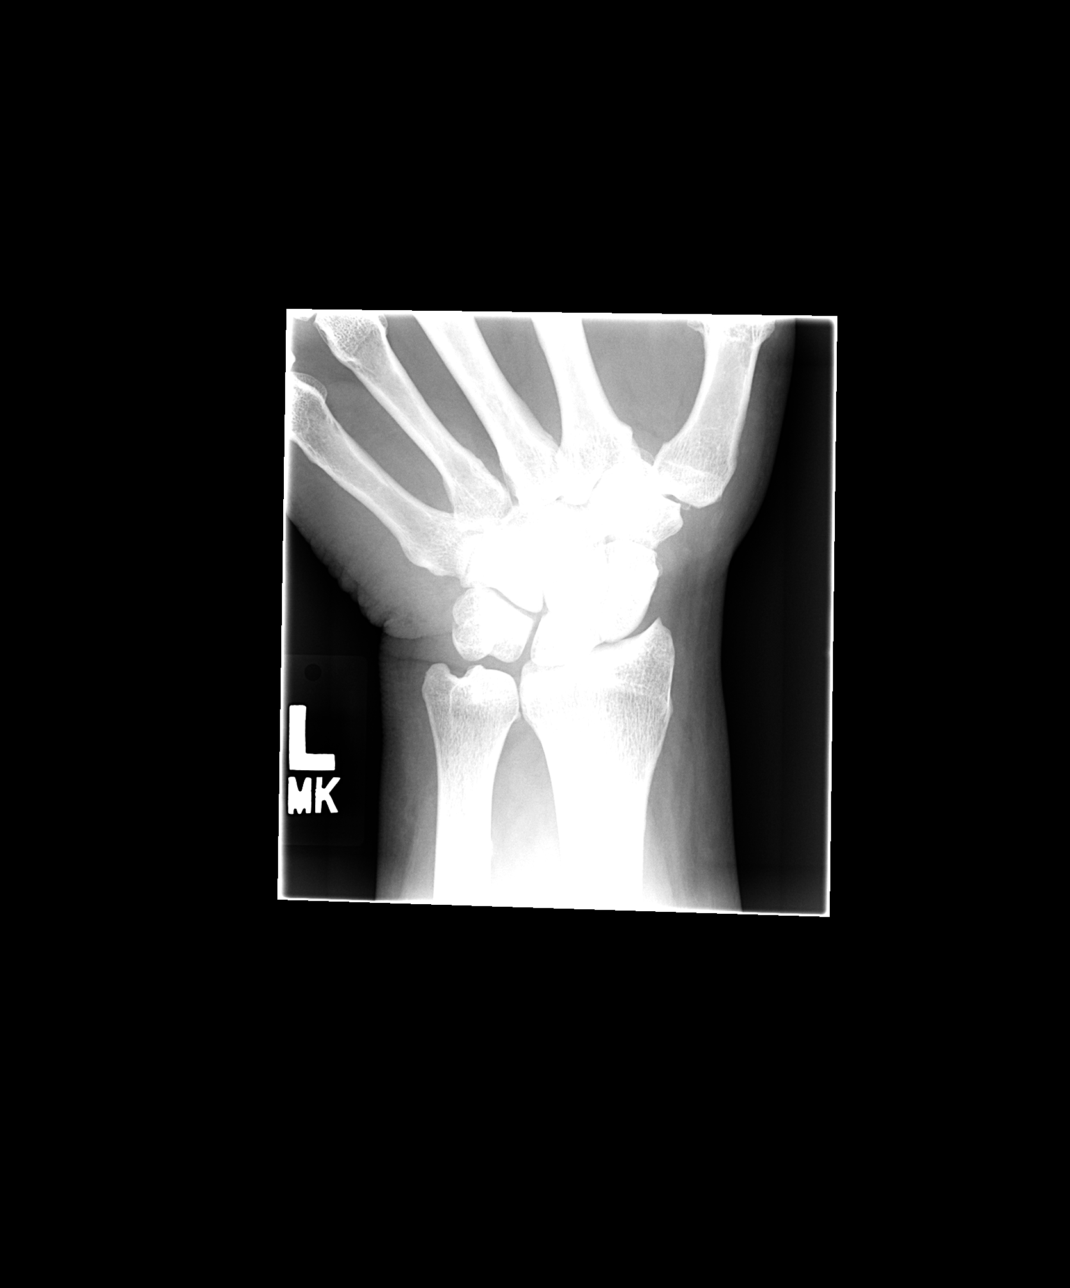

[4 of 4 positions shown; findings below may reference images not displayed]

FINDINGS: There are mild degenerative changes involving the wrist.  I do not see any acute bony findings or evidence for avascular necrosis.
IMPRESSION: Degenerative changes, but no acute bony findings.

## 2007-12-17 ENCOUNTER — Telehealth (INDEPENDENT_AMBULATORY_CARE_PROVIDER_SITE_OTHER): Payer: Self-pay | Admitting: *Deleted

## 2007-12-26 ENCOUNTER — Ambulatory Visit: Payer: Self-pay | Admitting: Endocrinology

## 2007-12-26 DIAGNOSIS — E042 Nontoxic multinodular goiter: Secondary | ICD-10-CM

## 2007-12-26 DIAGNOSIS — B351 Tinea unguium: Secondary | ICD-10-CM

## 2007-12-26 HISTORY — DX: Nontoxic multinodular goiter: E04.2

## 2007-12-26 HISTORY — DX: Tinea unguium: B35.1

## 2007-12-29 ENCOUNTER — Encounter: Payer: Self-pay | Admitting: Endocrinology

## 2007-12-29 ENCOUNTER — Telehealth (INDEPENDENT_AMBULATORY_CARE_PROVIDER_SITE_OTHER): Payer: Self-pay | Admitting: *Deleted

## 2008-01-06 ENCOUNTER — Encounter: Payer: Self-pay | Admitting: Endocrinology

## 2008-01-06 ENCOUNTER — Encounter: Admission: RE | Admit: 2008-01-06 | Discharge: 2008-04-05 | Payer: Self-pay | Admitting: Cardiology

## 2008-01-06 ENCOUNTER — Ambulatory Visit (HOSPITAL_COMMUNITY): Admission: RE | Admit: 2008-01-06 | Discharge: 2008-01-06 | Payer: Self-pay | Admitting: Endocrinology

## 2008-01-06 ENCOUNTER — Encounter (INDEPENDENT_AMBULATORY_CARE_PROVIDER_SITE_OTHER): Payer: Self-pay | Admitting: Diagnostic Radiology

## 2008-01-12 ENCOUNTER — Encounter (INDEPENDENT_AMBULATORY_CARE_PROVIDER_SITE_OTHER): Payer: Self-pay | Admitting: *Deleted

## 2008-01-12 ENCOUNTER — Encounter: Payer: Self-pay | Admitting: Endocrinology

## 2008-01-22 ENCOUNTER — Encounter: Admission: RE | Admit: 2008-01-22 | Discharge: 2008-01-22 | Payer: Self-pay | Admitting: Neurosurgery

## 2008-01-22 IMAGING — CT CT HEAD W/O CM
1 of 2 series · 13 of 30 positions shown, 17 images · IV contrast (agent unspecified)
Comparison: There are no prior studies for comparison.

CLINICAL DATA: 56 year-old-male with dizziness, shortness of breath.   Near syncope 3 days ago.   Shortness of breath and weakness for 1 month, worst past 3 days.  History of CHF, COPD, hypertension, diabetes. 
 HEAD CT WITHOUT CONTRAST:
TECHNIQUE: Contiguous axial images were obtained from the base of the skull through the vertex according to standard protocol without contrast.

[Series 2: brain · axial · 0.47mm/px · z∈[+119,+255]mm · 13 of 32 slices shown, 17 images]
[im 3/32  brain]
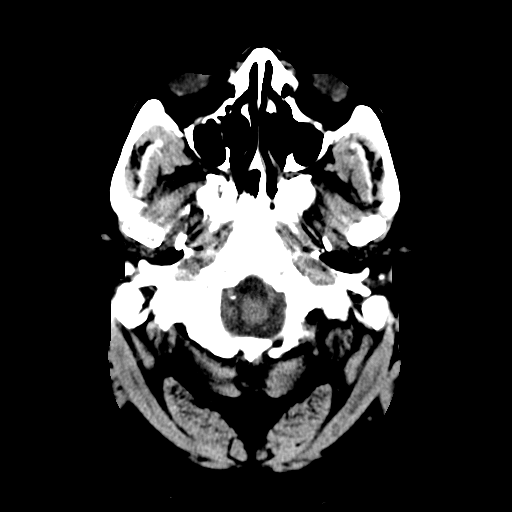
[im 3/32  bone]
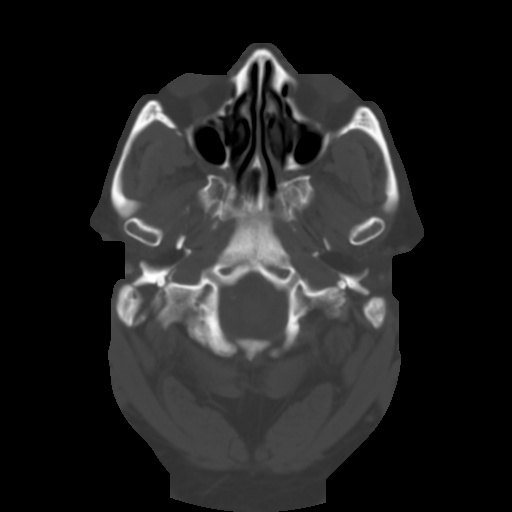
[im 5/32  brain]
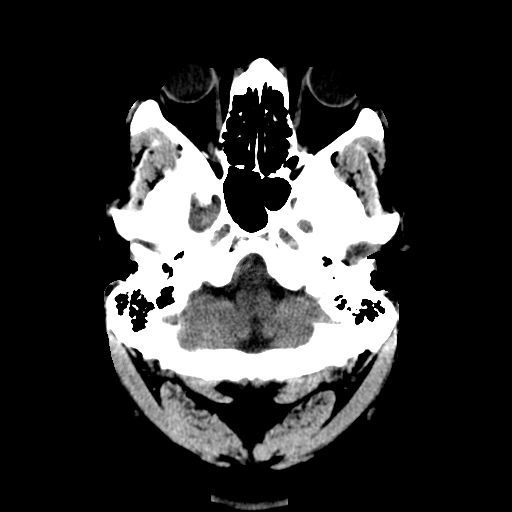
[im 7/32  brain]
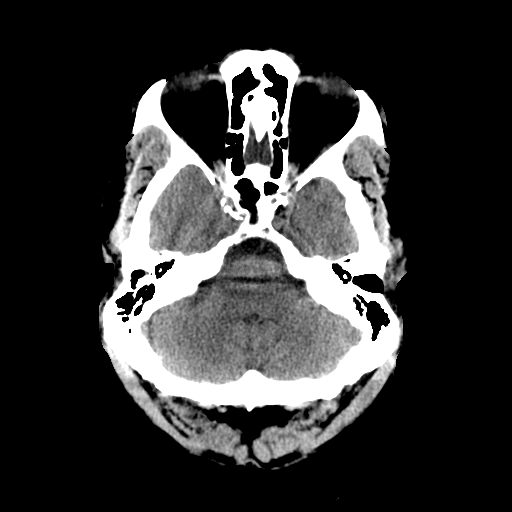
[im 9/32  brain]
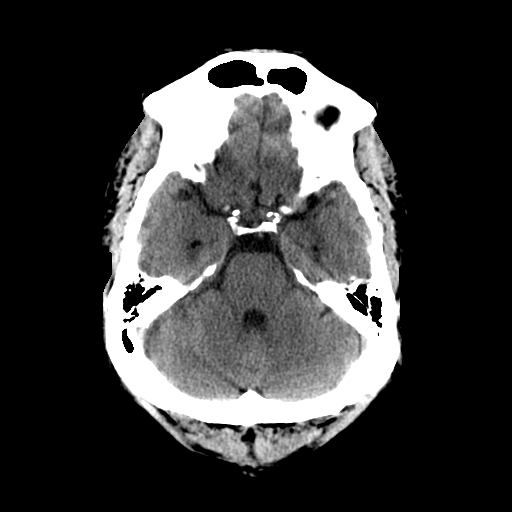
[im 12/32  brain]
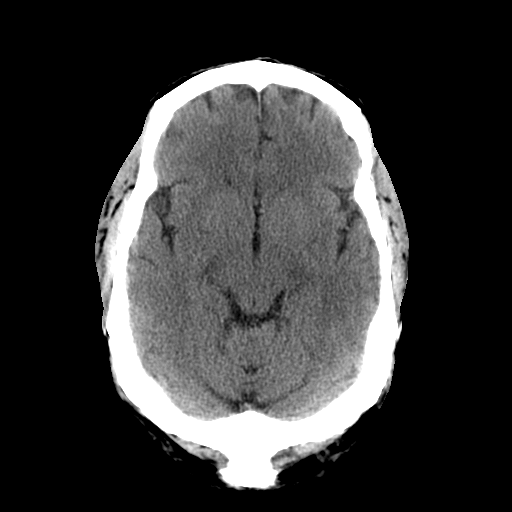
[im 12/32  bone]
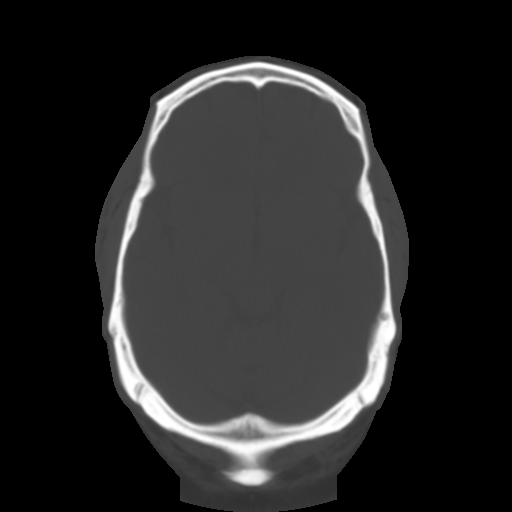
[im 14/32  brain]
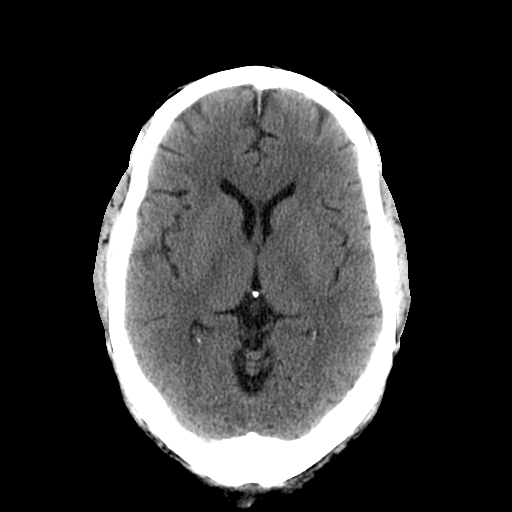
[im 16/32  brain]
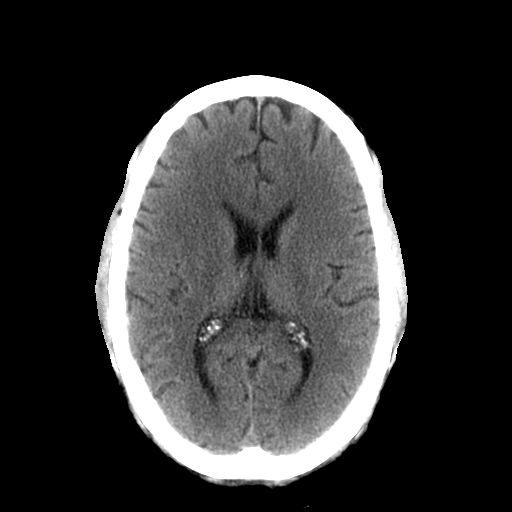
[im 18/32  brain]
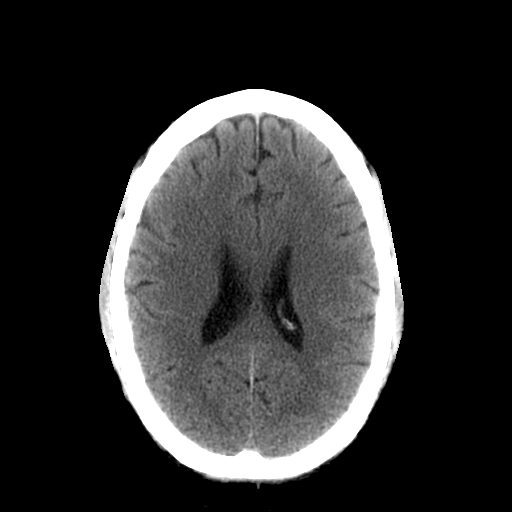
[im 20/32  brain]
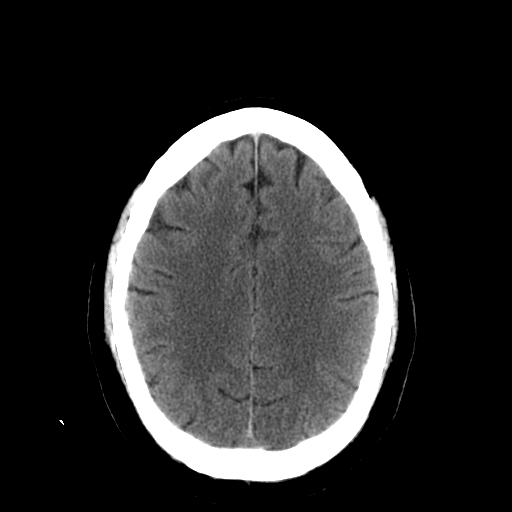
[im 20/32  bone]
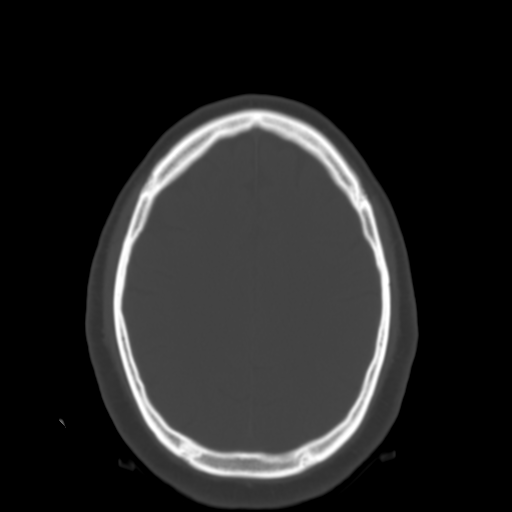
[im 23/32  brain]
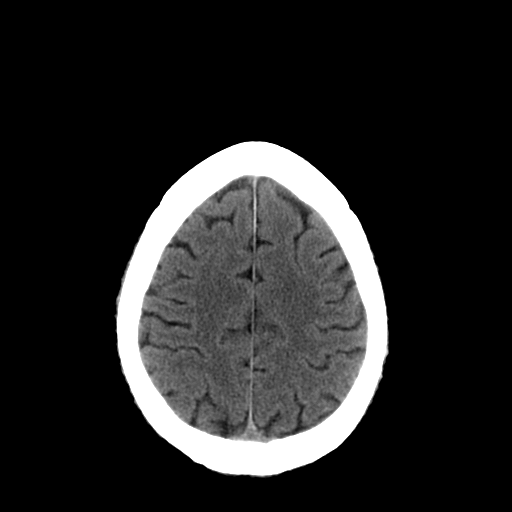
[im 25/32  brain]
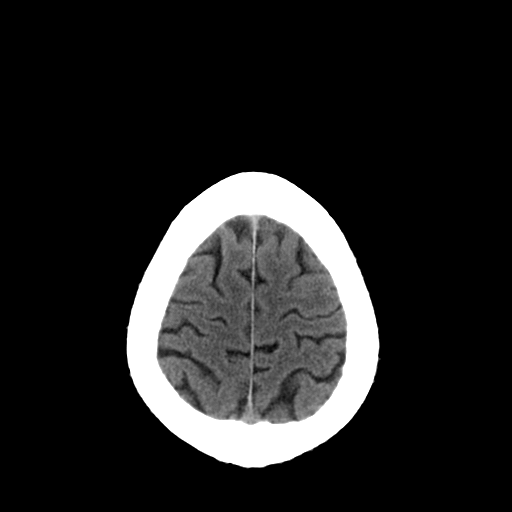
[im 27/32  brain]
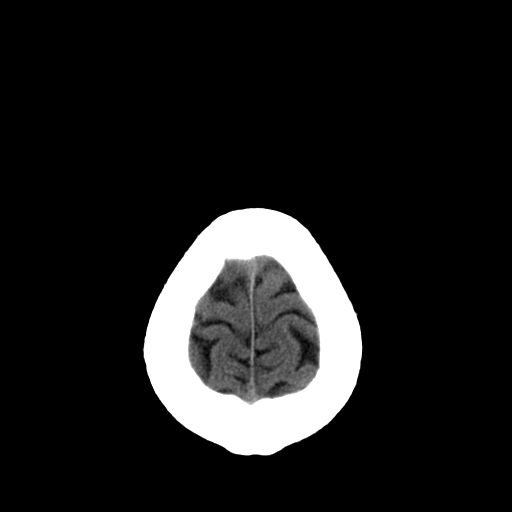
[im 29/32  brain]
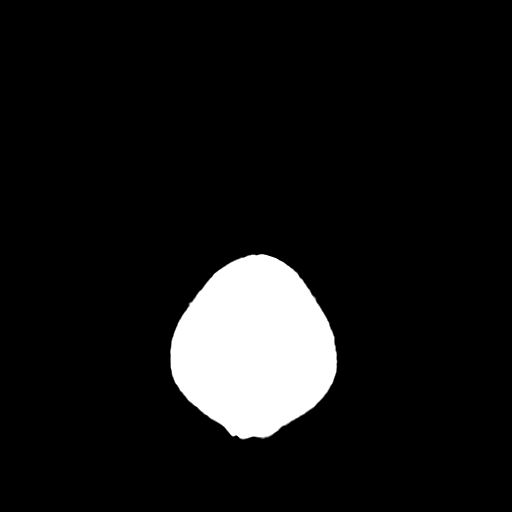
[im 29/32  bone]
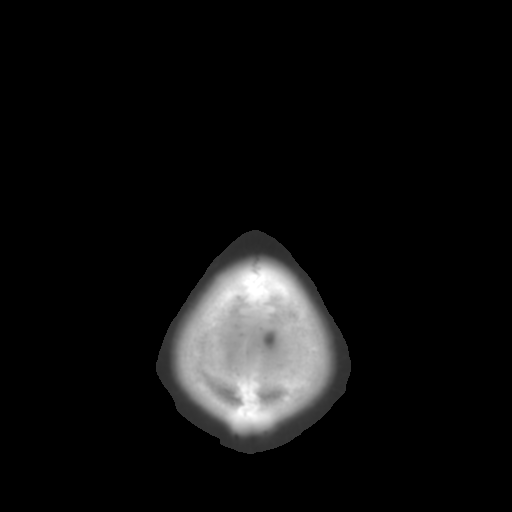

[13 of 30 positions shown; findings below may reference images not displayed]

FINDINGS: There is no evidence of intracranial hemorrhage, brain edema, acute infarct, mass lesion, or mass effect.  No other intra-axial abnormalities are seen, and the ventricles are within normal limits.  No abnormal extra-axial fluid collections or masses are identified.  No skull abnormalities are noted.  Note is made of atherosclerotic calcifications of the internal carotid arteries.
IMPRESSION: Negative non-contrast head CT.

## 2008-01-22 IMAGING — CR DG CHEST 2V
2 series · 2 of 2 positions shown · non-contrast
Comparison: none

HISTORY: Dizziness, dyspnea, smoker, hypertension, diabetes

CHEST 2 VIEWS:
Comparison 02/06/2007
Cardiac enlargement with mild pulmonary vascular congestion.
Chronic peribronchial thickening and accentuation of perihilar markings.
Subsegmental atelectasis lingula, increased.
No consolidation or pleural effusions.
Bones unremarkable.

[w chest lat]
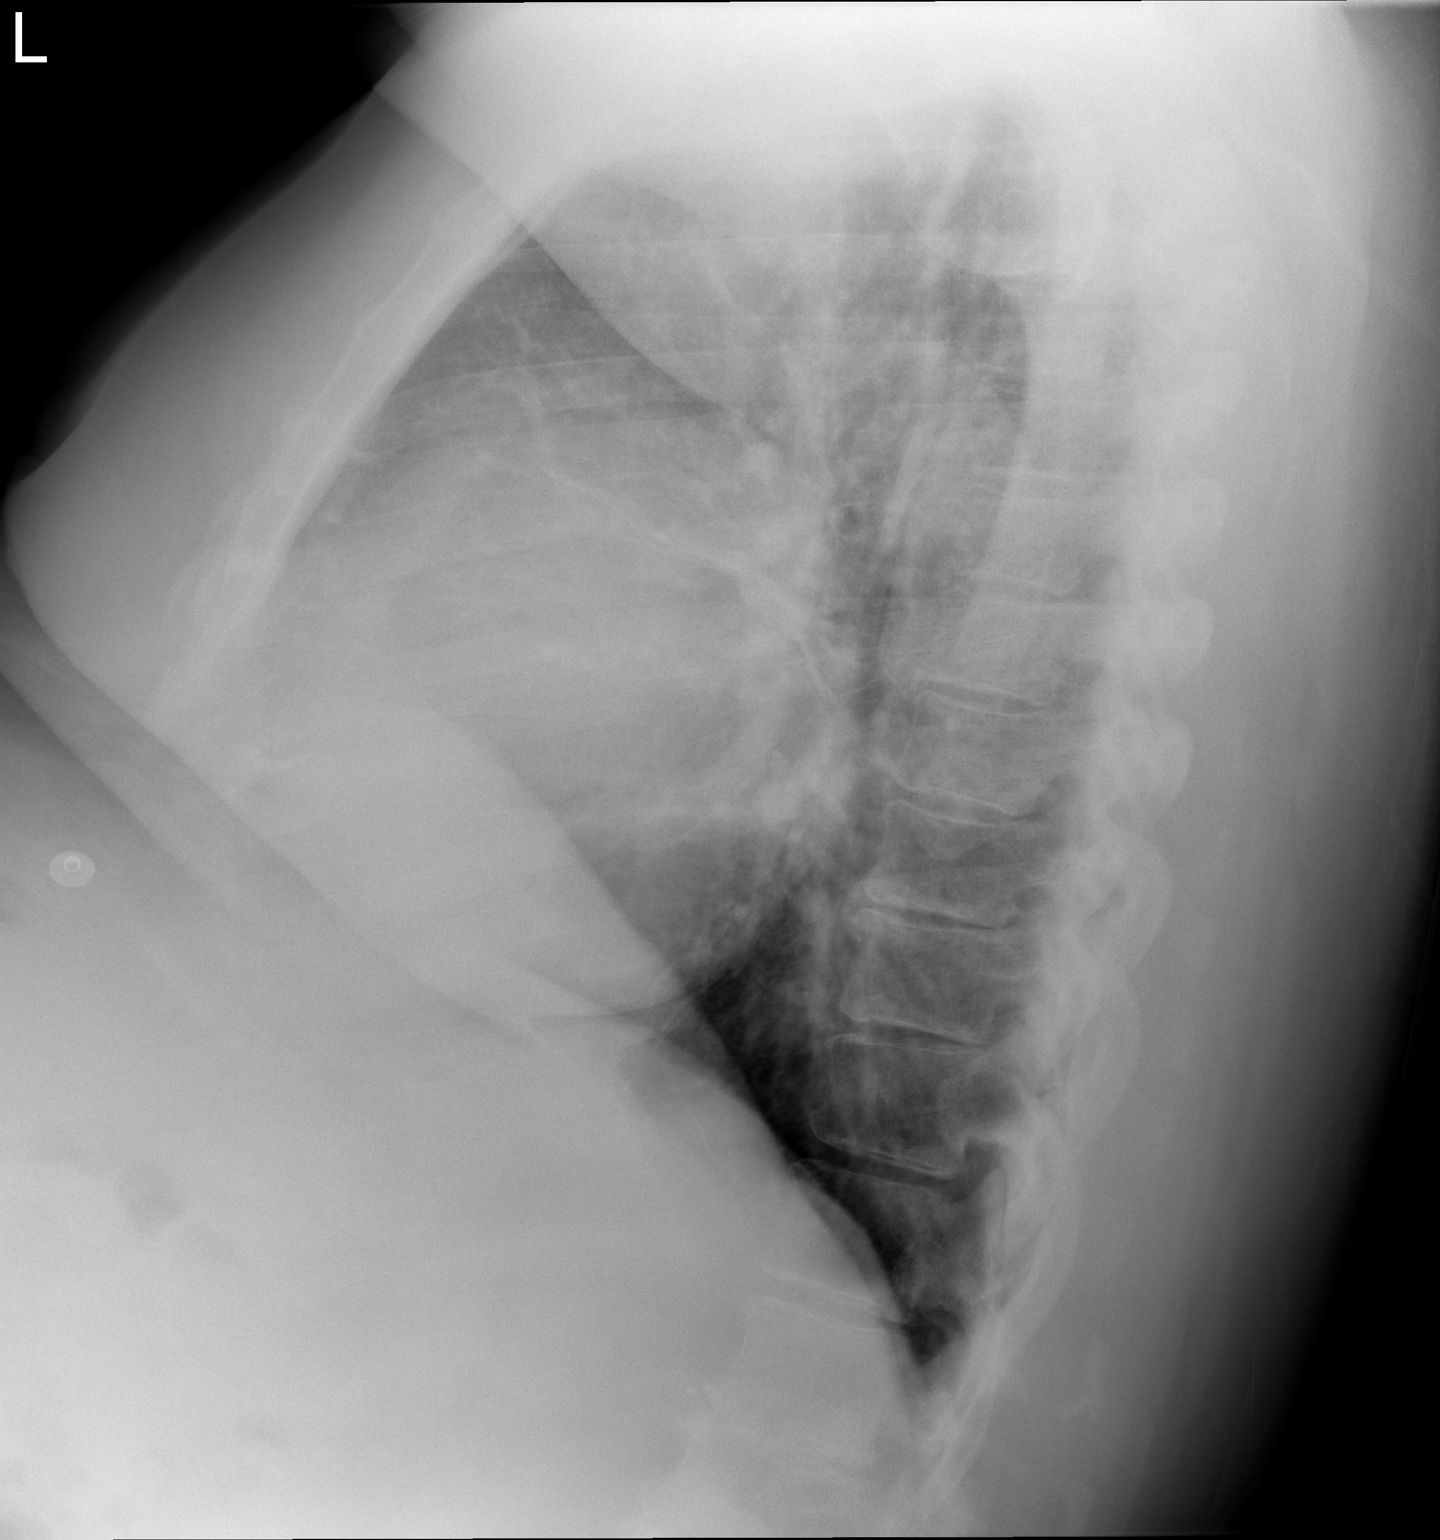

[w chest pa]
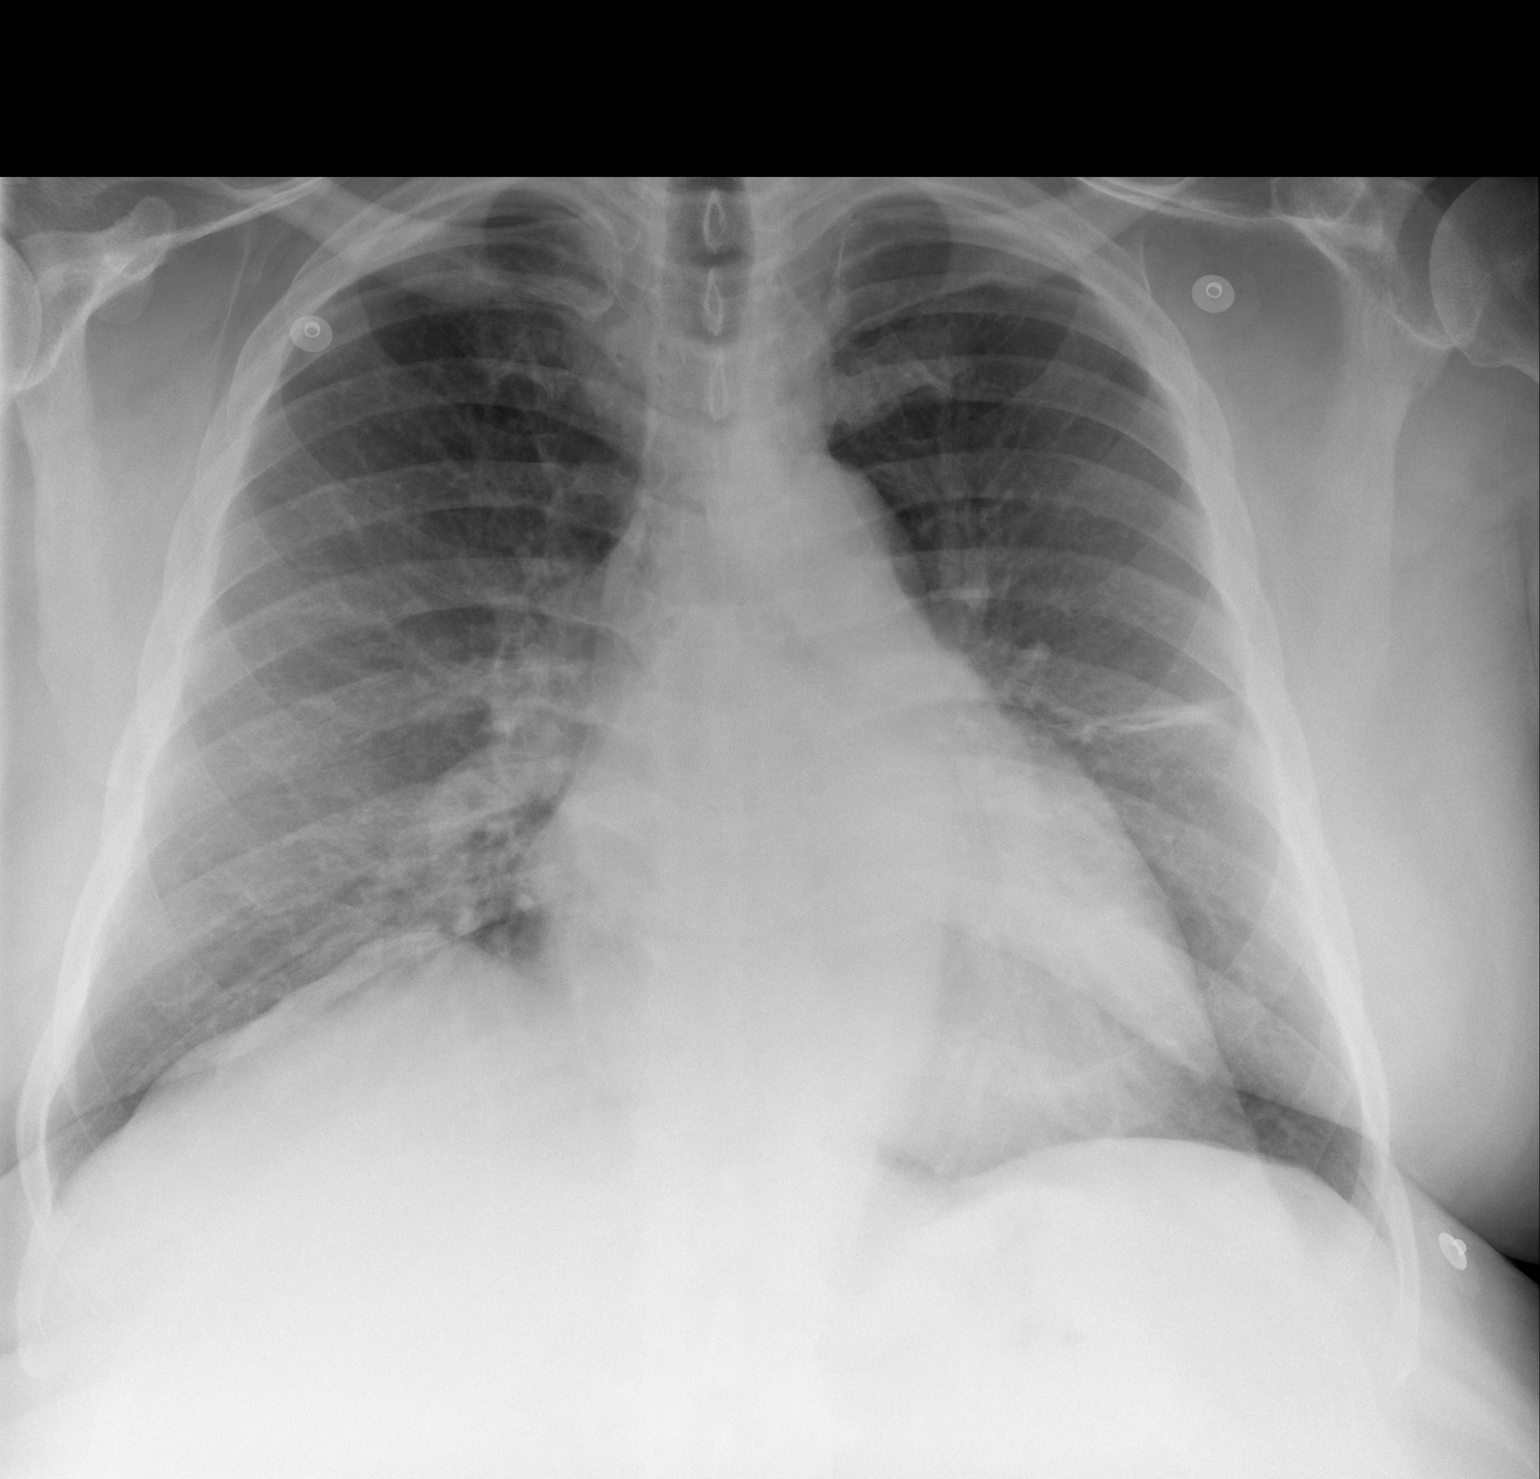

[2 of 2 positions shown; findings below may reference images not displayed]

IMPRESSION: Cardiomegaly with pulmonary vascular congestion.
Subsegmental atelectasis lingula.

## 2008-01-23 IMAGING — US US RENAL
1 series · 14 of 20 positions shown · non-contrast
Comparison: none

CLINICAL DATA: Dyspnea, hyperkalemia, renal insufficiency.  
 RENAL/URINARY TRACT ULTRASOUND:
TECHNIQUE: Complete ultrasound examination of the urinary tract was performed including evaluation of the kidneys, renal collecting systems, and urinary bladder.

[Series 1: unknown · 0.33mm/px · 14 of 20 slices shown]
[im 1/20]
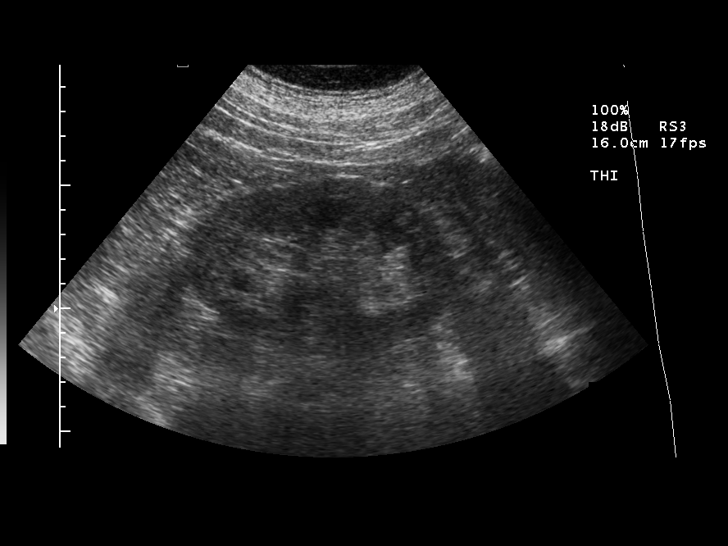
[im 3/20]
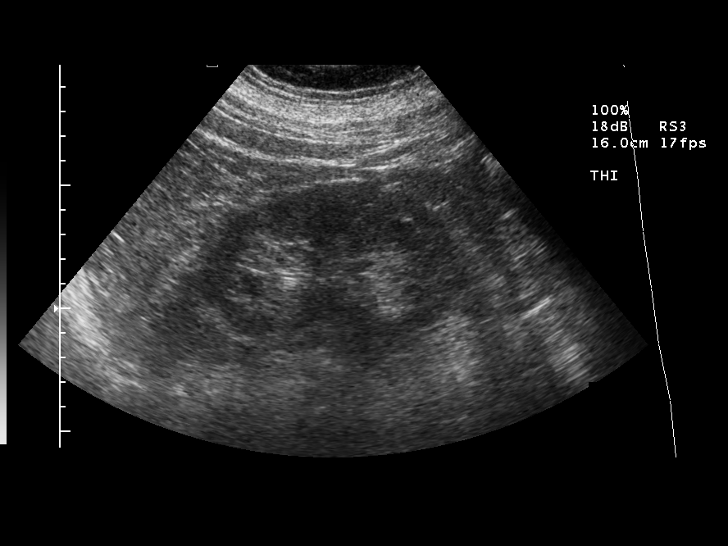
[im 4/20]
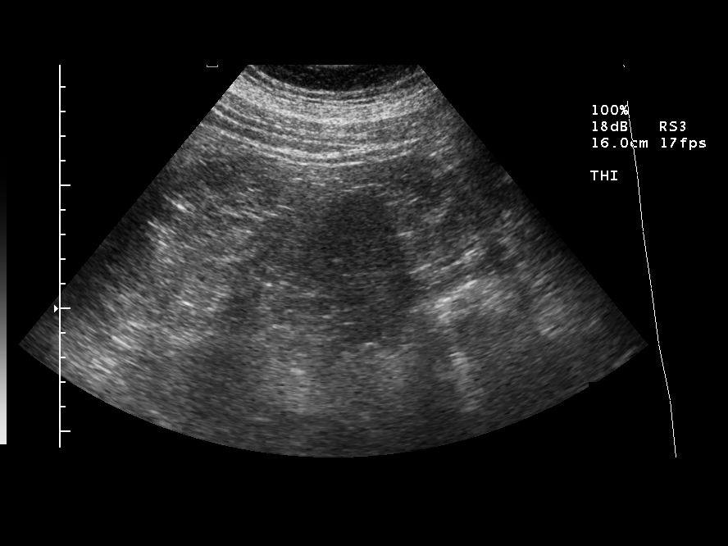
[im 6/20]
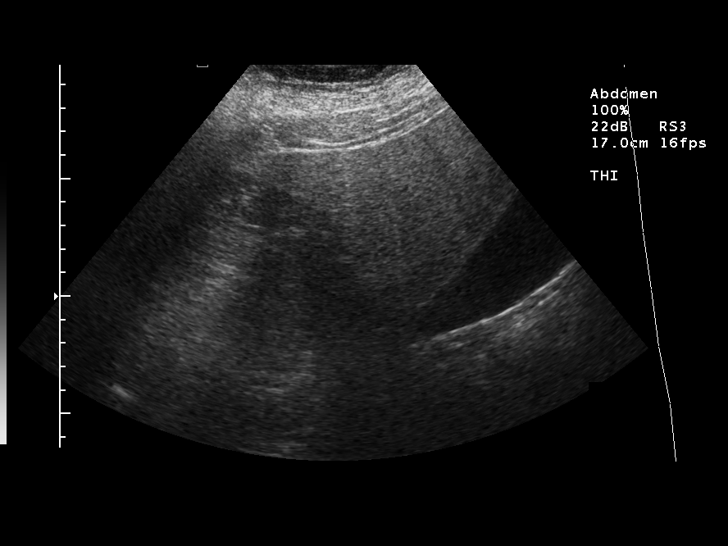
[im 7/20]
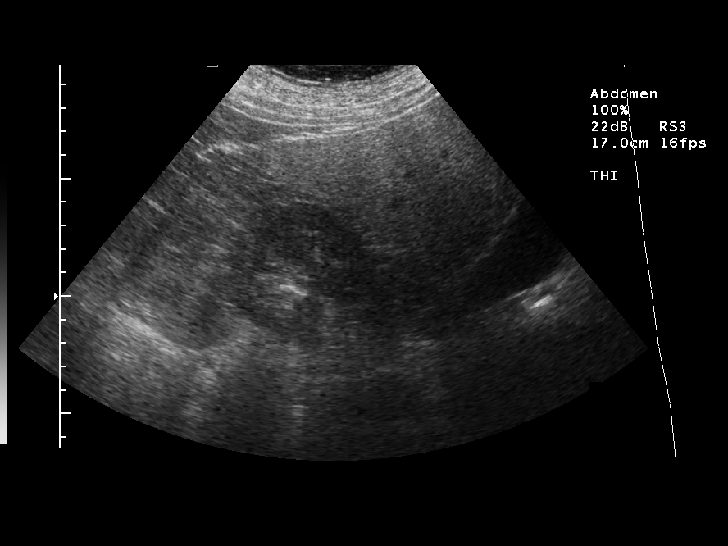
[im 8/20]
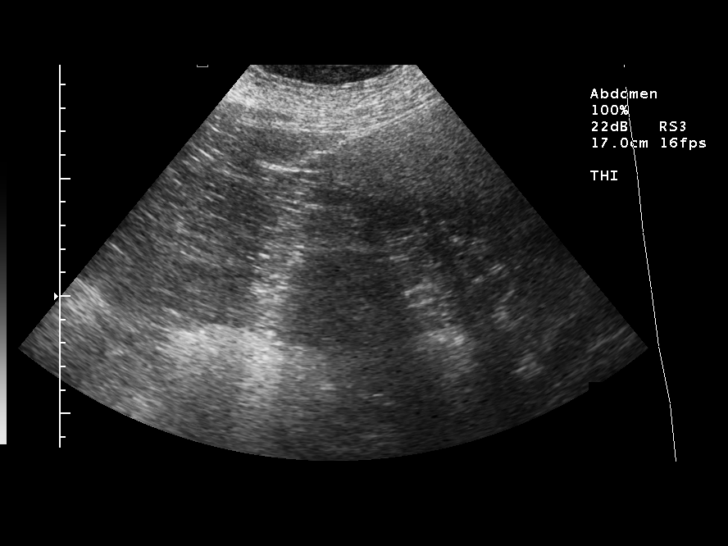
[im 10/20]
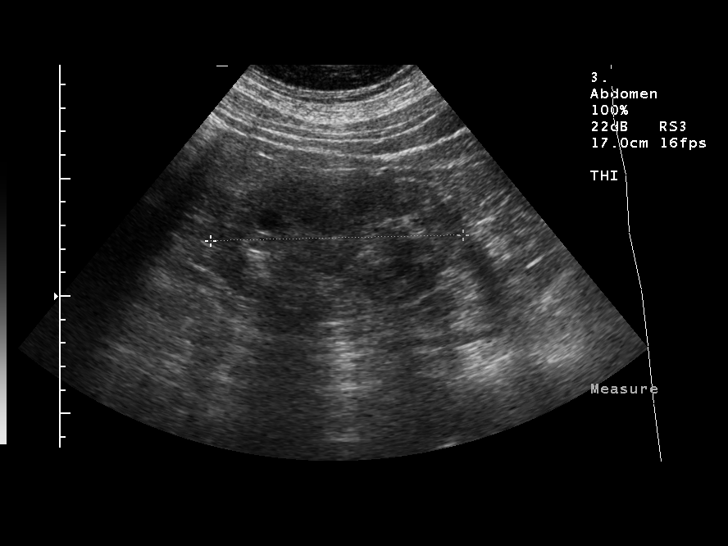
[im 11/20]
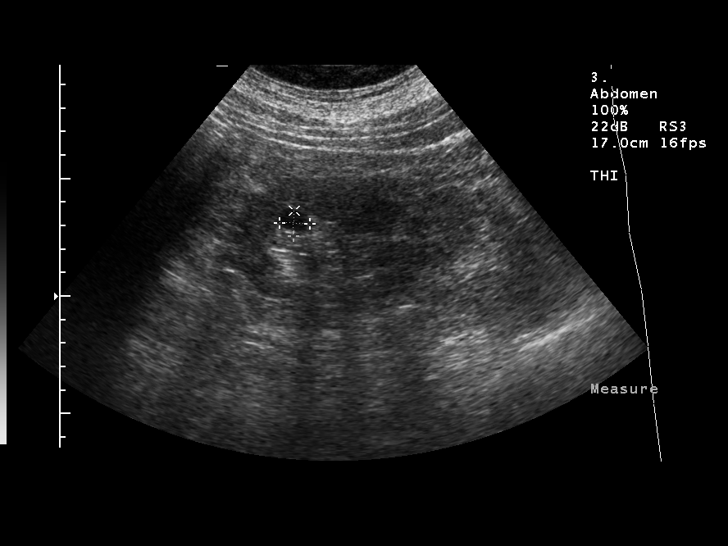
[im 13/20]
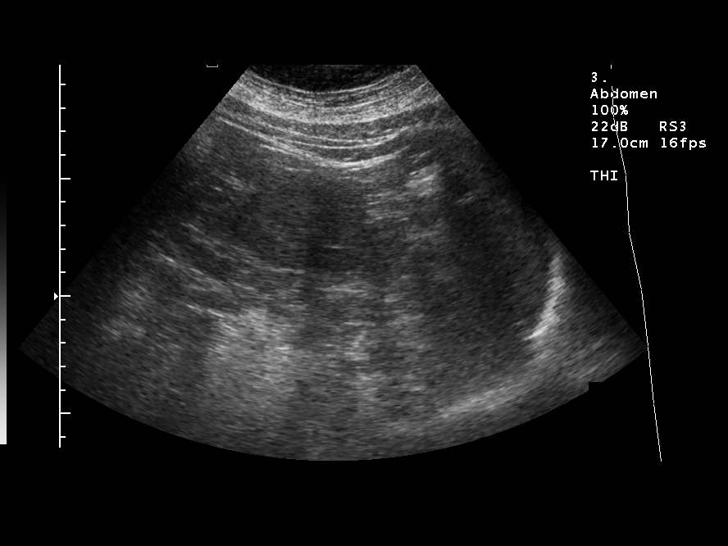
[im 14/20]
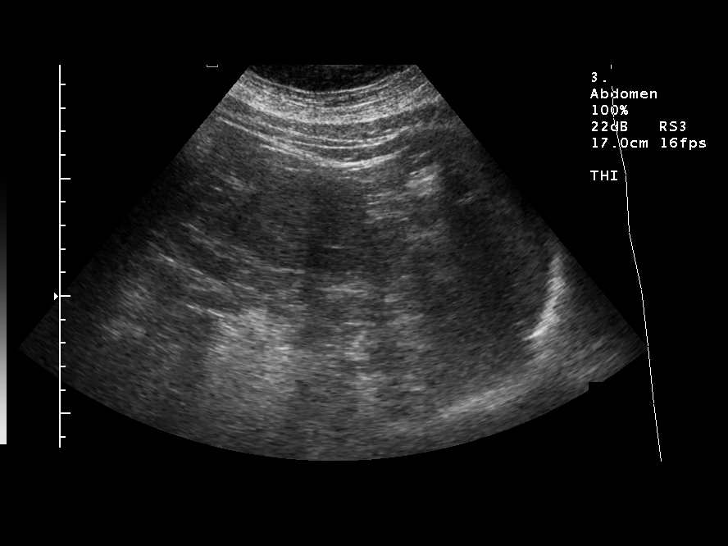
[im 16/20]
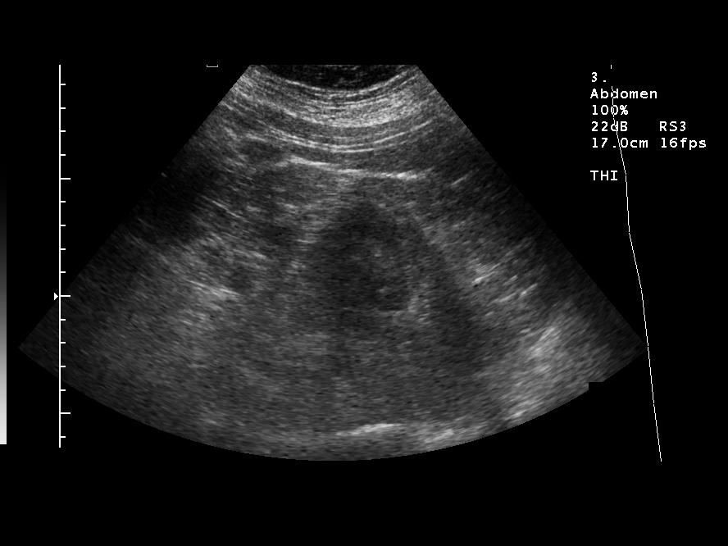
[im 17/20]
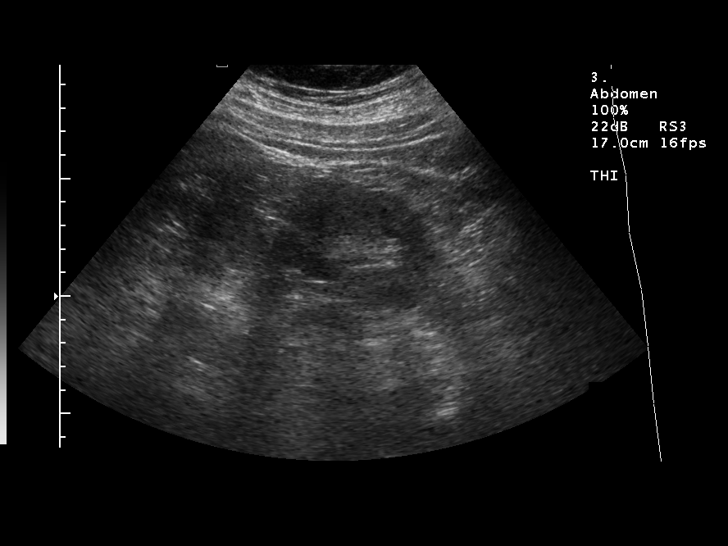
[im 18/20]
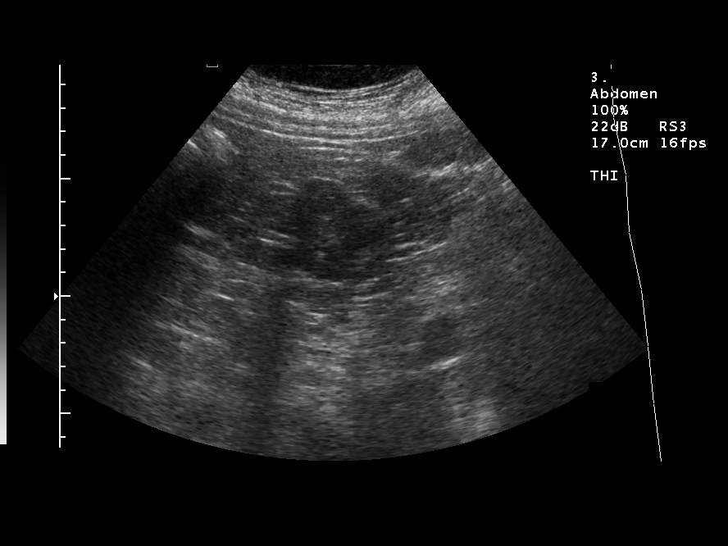
[im 20/20]
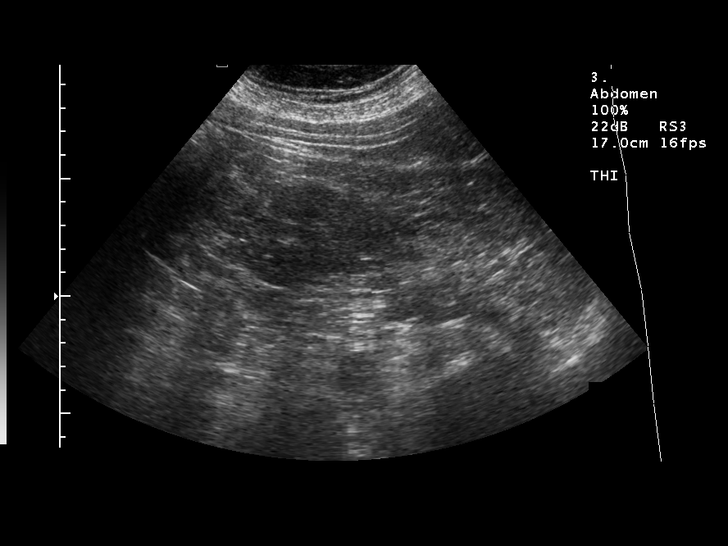

[14 of 20 positions shown; findings below may reference images not displayed]

FINDINGS: The right kidney is normal measuring 11.6 cm in length with normal echogenicity, no hydronephrosis or focal lesion.  The left kidney measures 10.7 cm in length.  There is a 1.3 x 1.1 x 1.4 cm cyst in the mid to upper portion.  No hydronephrosis.  No urine is present in the bladder.  I believe echogenicity is within normal limits.
IMPRESSION: Normal except for a small cyst in the left kidney.

## 2008-01-26 ENCOUNTER — Ambulatory Visit: Payer: Self-pay | Admitting: Endocrinology

## 2008-01-30 ENCOUNTER — Ambulatory Visit: Payer: Self-pay | Admitting: Internal Medicine

## 2008-03-09 ENCOUNTER — Ambulatory Visit: Payer: Self-pay | Admitting: Internal Medicine

## 2008-03-11 ENCOUNTER — Encounter: Admission: RE | Admit: 2008-03-11 | Discharge: 2008-03-11 | Payer: Self-pay | Admitting: Neurosurgery

## 2008-03-23 ENCOUNTER — Ambulatory Visit: Payer: Self-pay | Admitting: Internal Medicine

## 2008-03-23 ENCOUNTER — Ambulatory Visit (HOSPITAL_COMMUNITY): Admission: RE | Admit: 2008-03-23 | Discharge: 2008-03-23 | Payer: Self-pay | Admitting: Internal Medicine

## 2008-03-30 IMAGING — CR DG CHEST 2V
2 series · 2 of 2 positions shown · non-contrast
Comparison: 04/02/07.

CLINICAL DATA: Chest pain. 
 CHEST - 2 VIEW:

[w chest pa *]
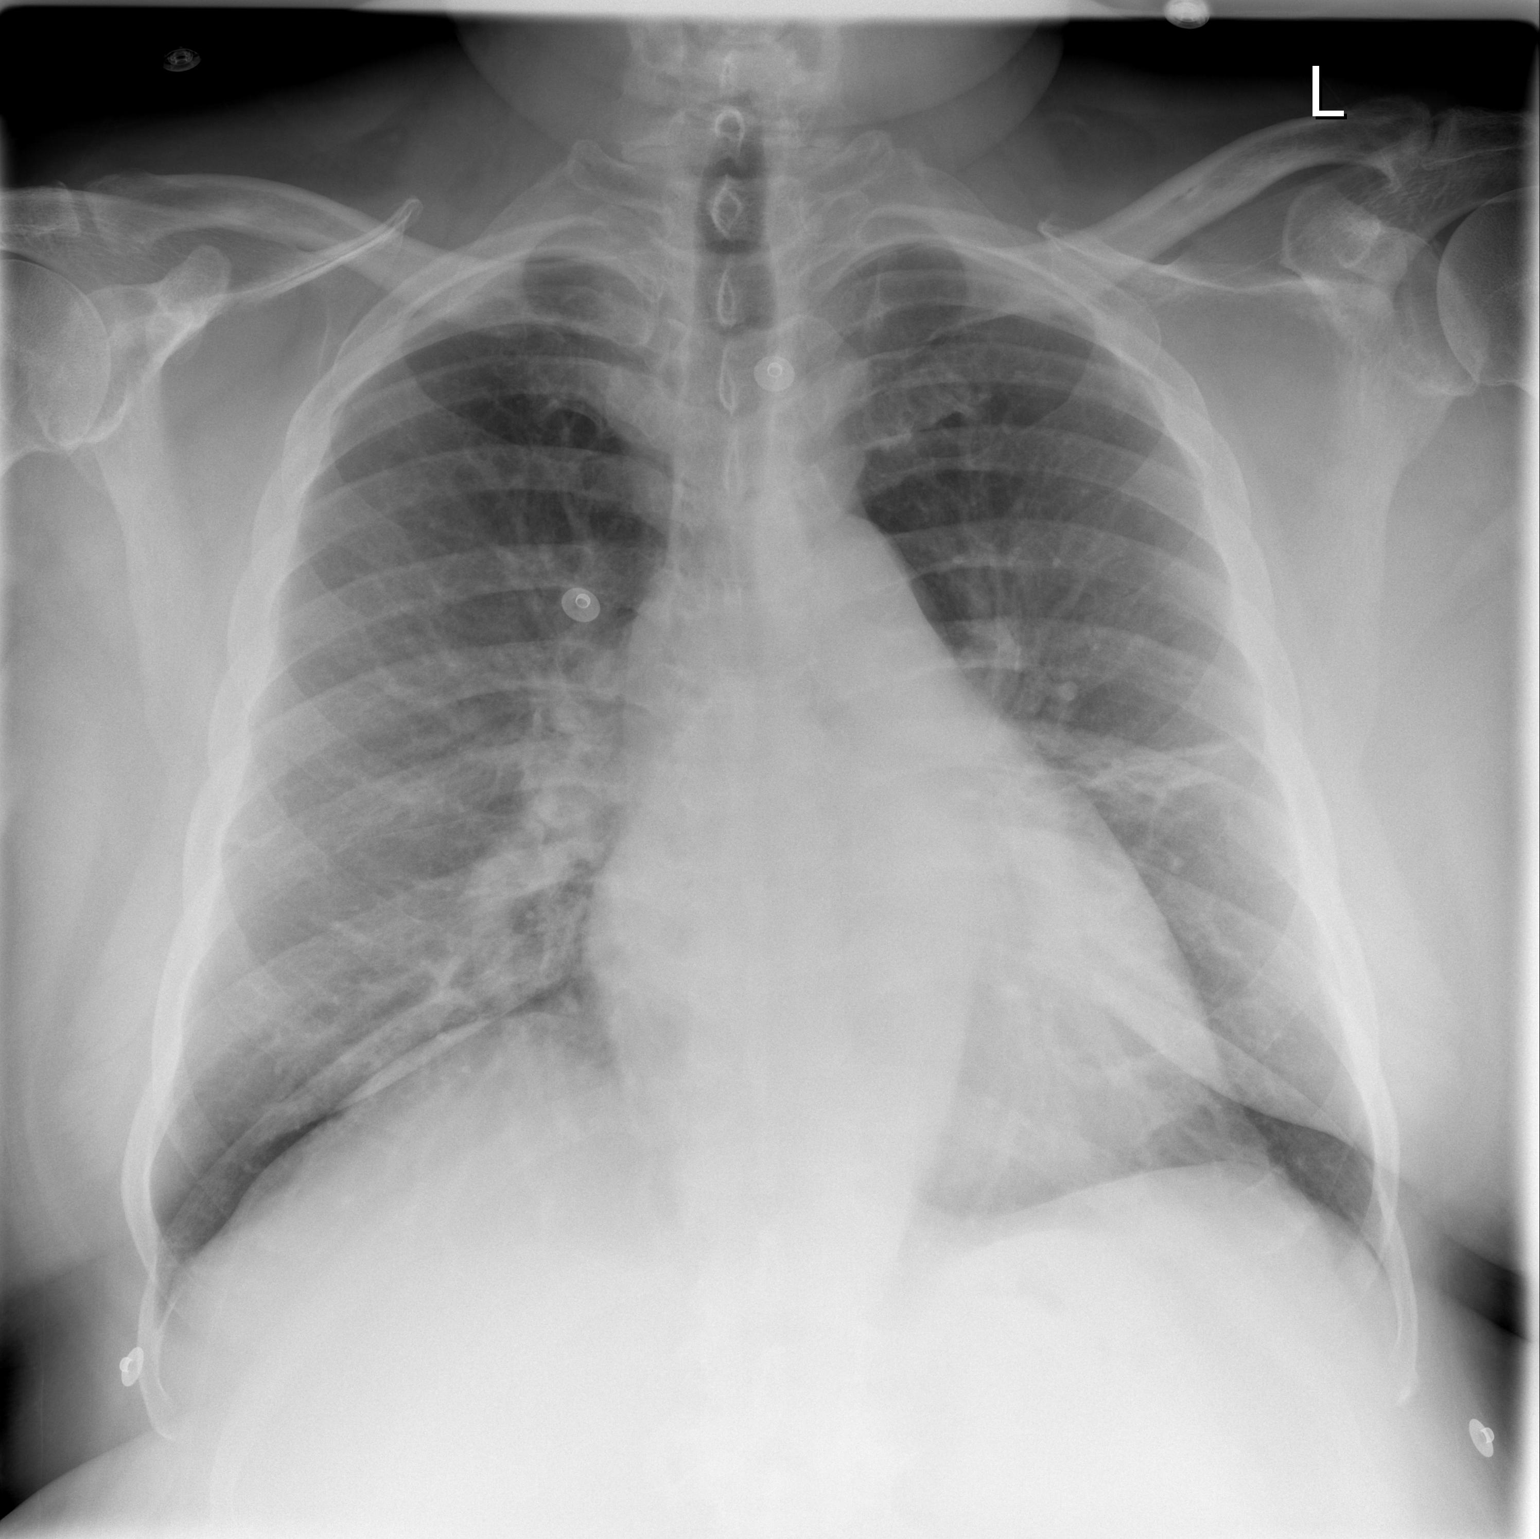

[w chest lat *]
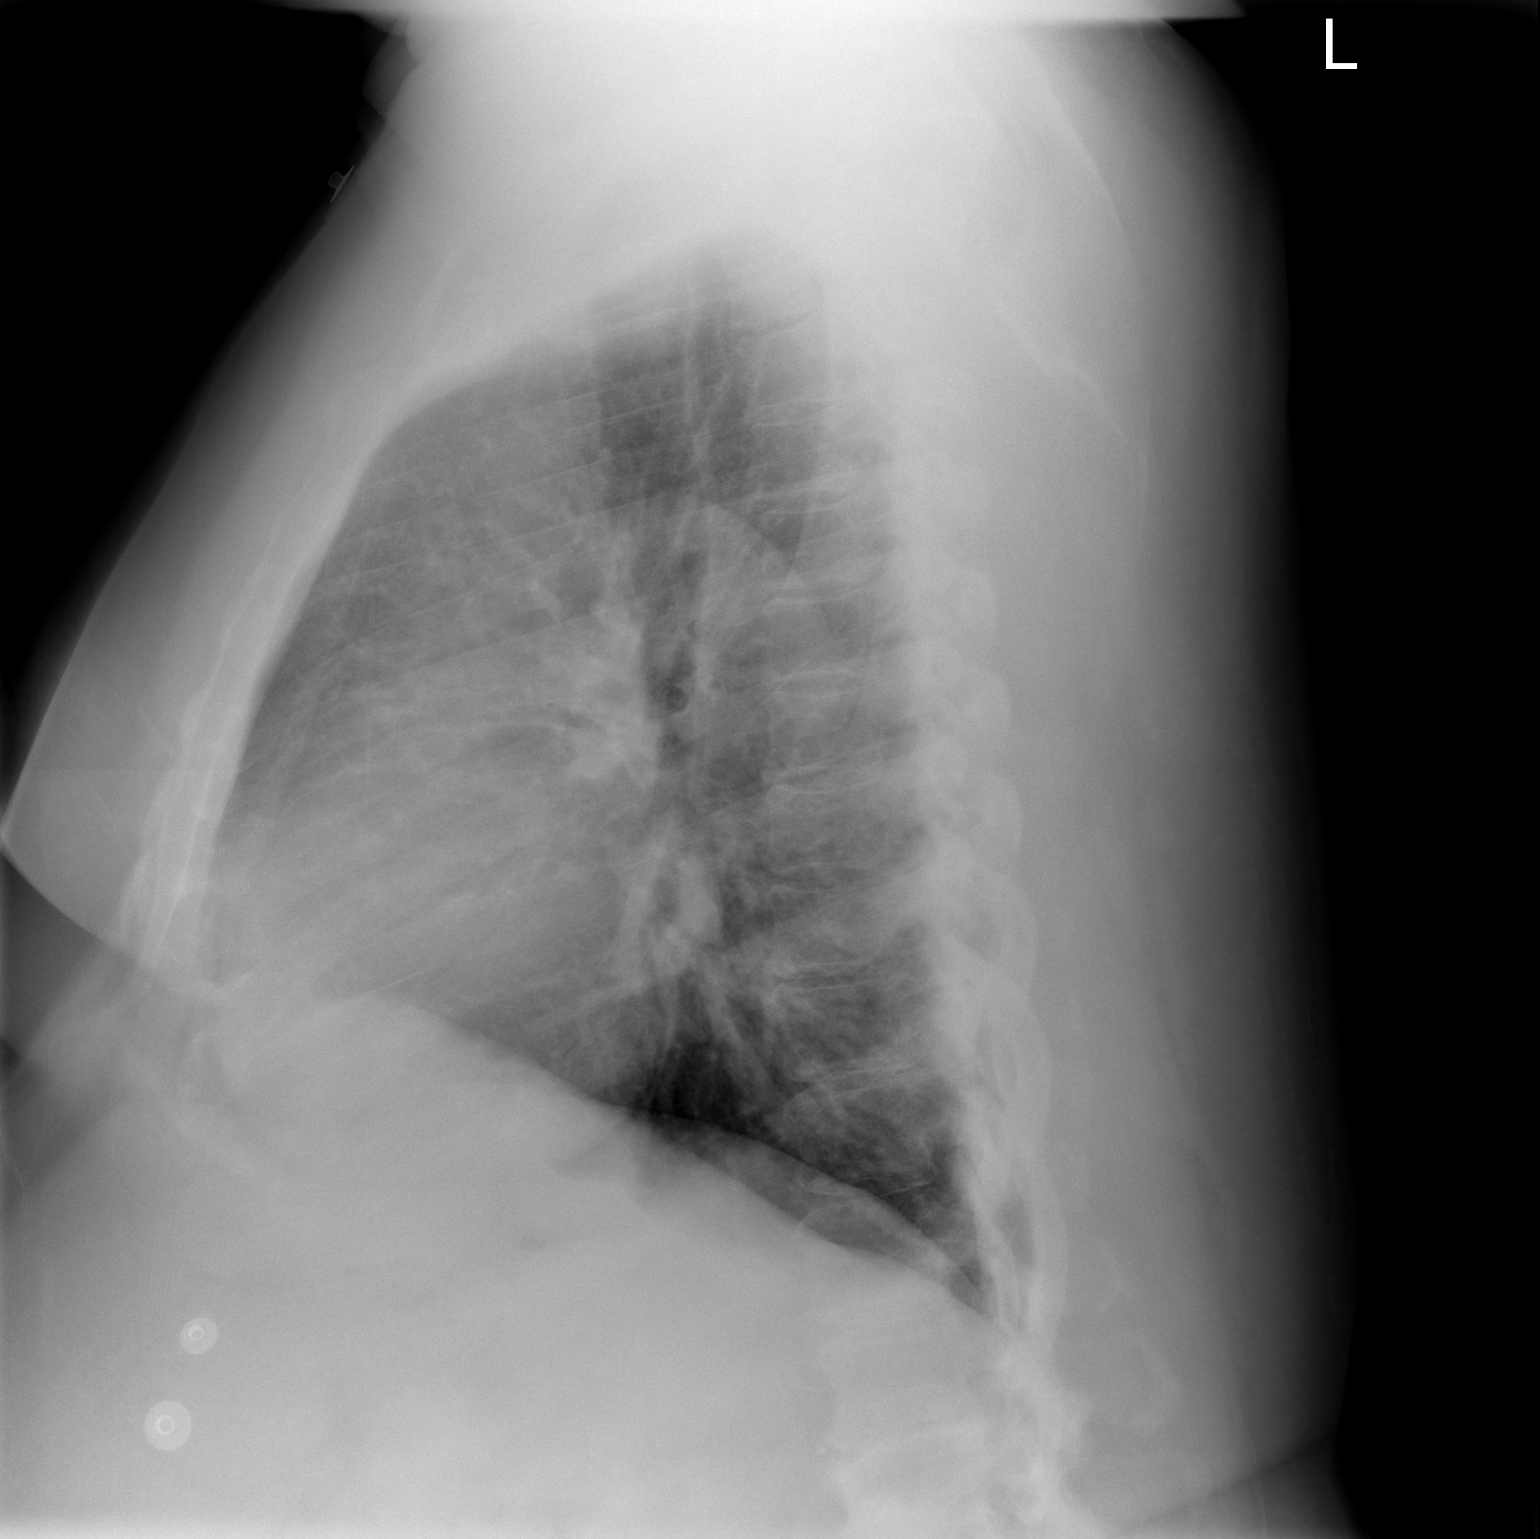

[2 of 2 positions shown; findings below may reference images not displayed]

FINDINGS: The heart is borderline enlarged but stable.  The mediastinal and hilar contours are stable.  Linear scarring changes and mild vascular congestion but no edema, focal infiltrates or effusions.
IMPRESSION: Chronic lung changes.  No acute pulmonary findings.

## 2008-04-07 ENCOUNTER — Ambulatory Visit: Payer: Self-pay

## 2008-04-07 ENCOUNTER — Encounter: Payer: Self-pay | Admitting: Internal Medicine

## 2008-05-06 ENCOUNTER — Ambulatory Visit: Payer: Self-pay | Admitting: Endocrinology

## 2008-05-12 IMAGING — MR MR HEAD W/O CM
8 of 10 series · 28 of 48 positions shown · IV contrast (agent unspecified)
Comparison: This study is read in conjunction with the previous CT scan of the brain of 04/02/07.

CLINICAL DATA: Weakness, dehydration, diabetes mellitus.
MRI BRAIN WITHOUT CONTRAST:
TECHNIQUE: Multiplanar and multiecho pulse sequences of the brain and surrounding structures were obtained according to standard protocol without IV contrast.
TECHNIQUE: 3-D time of flight pulse sequence was performed to examine the cerebral vasculature, centered at the circle of Willis, without IV contrast.  Multiplanar MR image reconstructions were generated to evaluate the vascular anatomy.

[Series 4: T1 · sagittal · 5.0mm · 0.45mm/px · 1 of 12 slices shown]
[im 1/12]
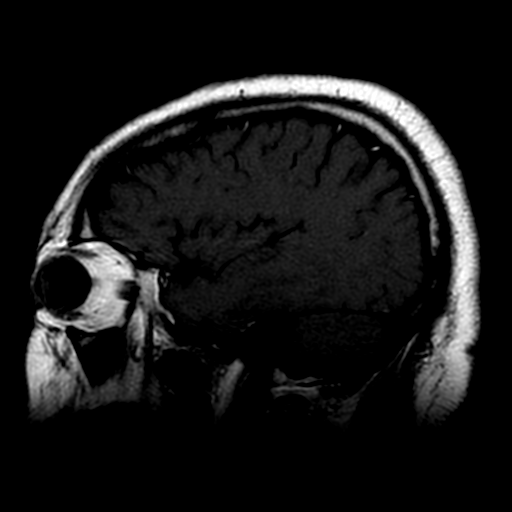

[Series 5: DWI · axial · 5.0mm · 1.41mm/px · z∈[-55,+88]mm · 7 of 54 slices shown (1 of 3)]
[im 1/54]
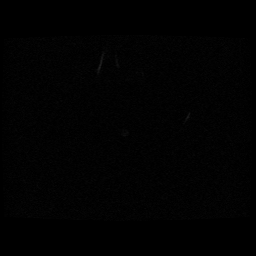
[im 9/54]
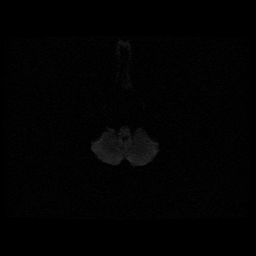
[im 18/54]
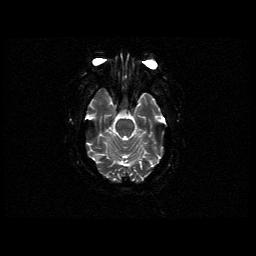
[im 27/54]
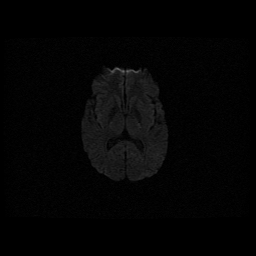
[im 36/54]
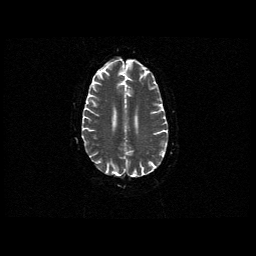
[im 45/54]
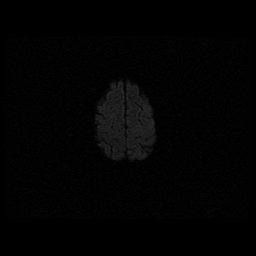
[im 54/54]
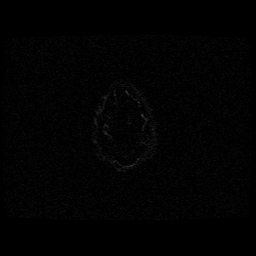

[Series 7: T2 · axial · 5.0mm · 0.47mm/px · z∈[-57,+89]mm · 3 of 22 slices shown (1 of 2)]
[im 1/22]
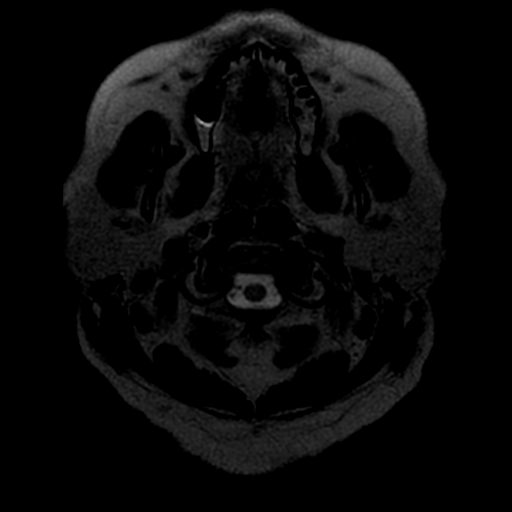
[im 11/22]
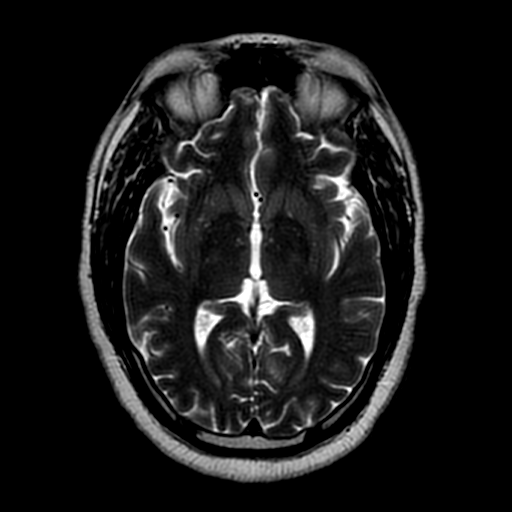
[im 22/22]
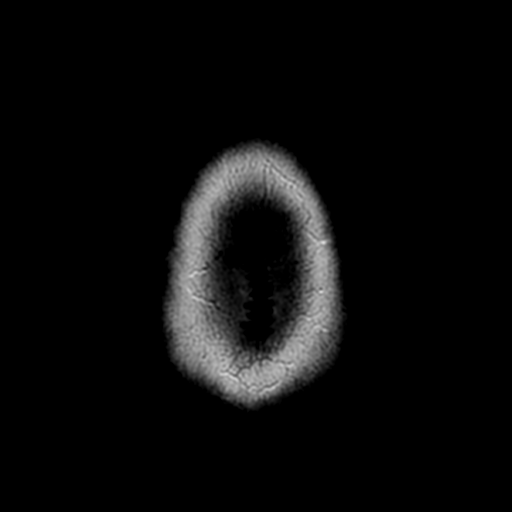

[Series 8: FLAIR · axial · 5.0mm · 0.47mm/px · z∈[-57,+89]mm · 3 of 22 slices shown]
[im 1/22]
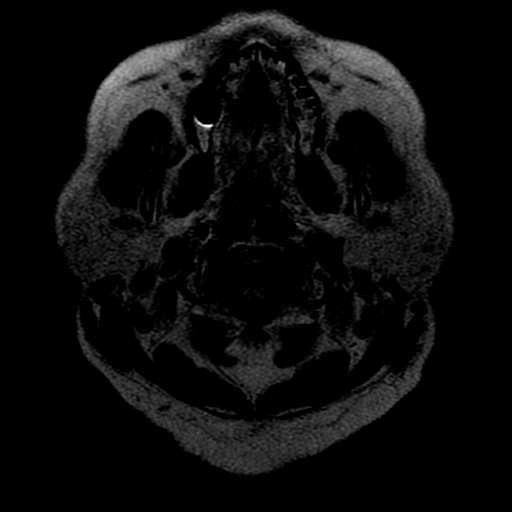
[im 11/22]
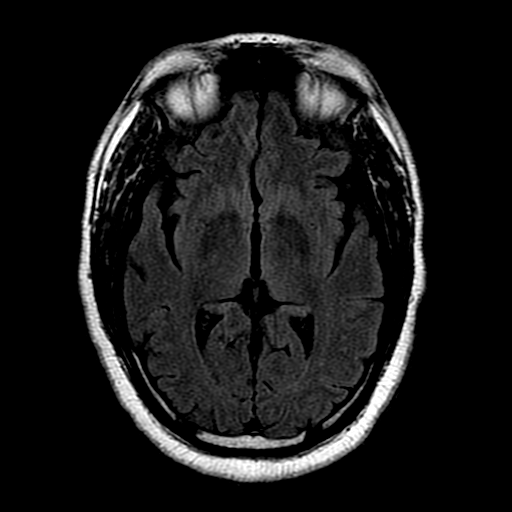
[im 22/22]
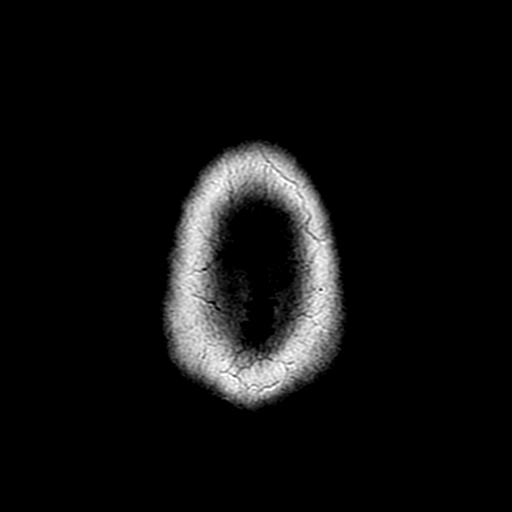

[Series 10: T2 · coronal · 5.0mm · 0.45mm/px · 3 of 22 slices shown (2 of 2)]
[im 1/22]
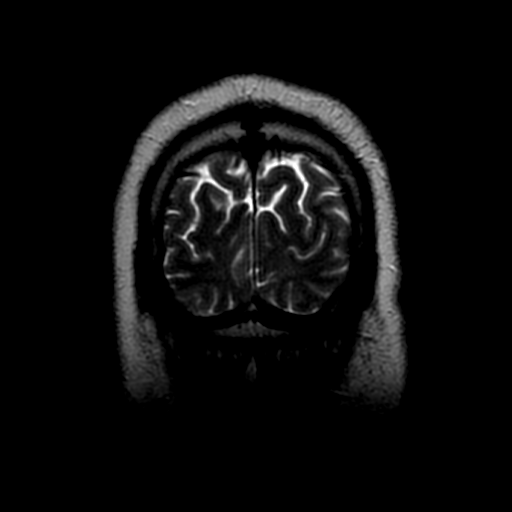
[im 11/22]
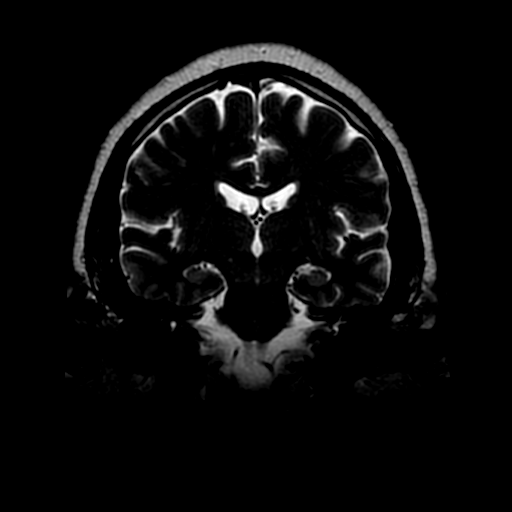
[im 22/22]
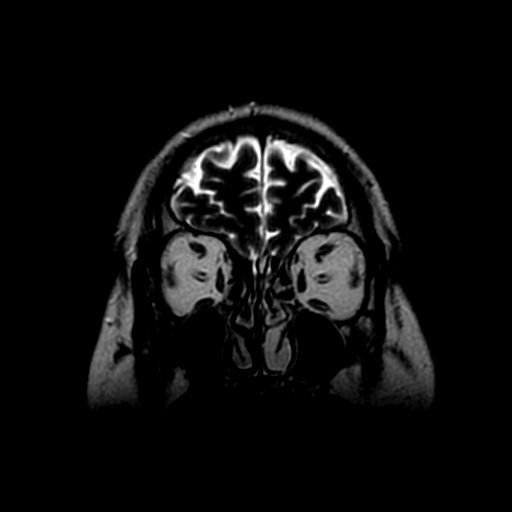

[Series 11: T2-star · axial · 5.0mm · 0.47mm/px · z∈[-55,+92]mm · 3 of 22 slices shown]
[im 1/22]
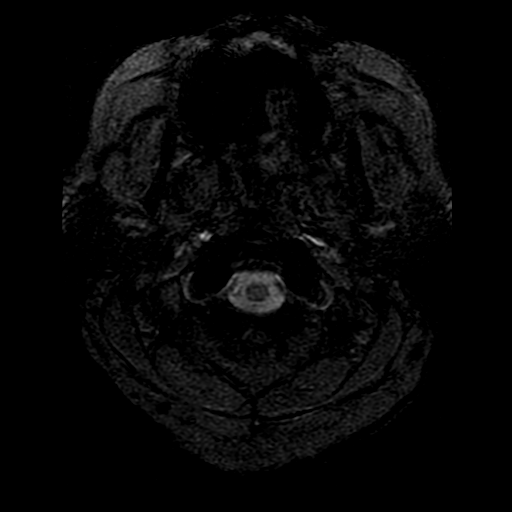
[im 11/22]
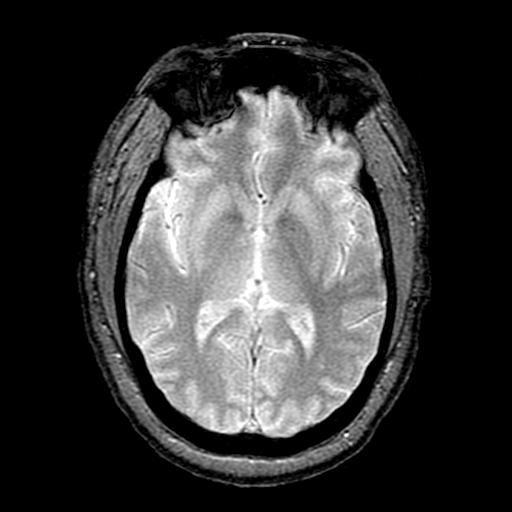
[im 22/22]
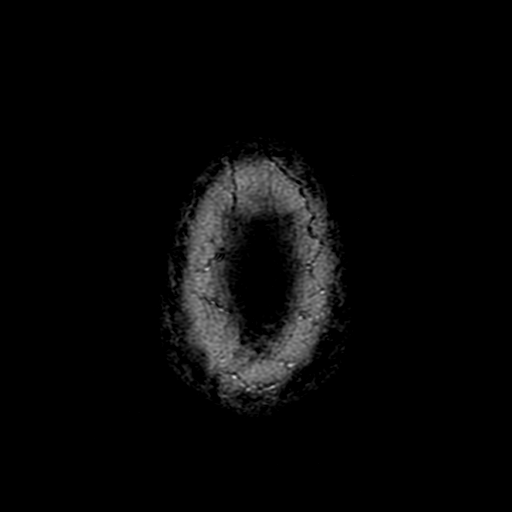

[Series 500: DWI · axial · 5.0mm · 1.41mm/px · z∈[-55,+88]mm · 4 of 27 slices shown (2 of 3)]
[im 1/27]
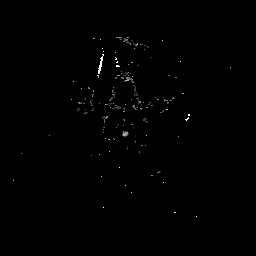
[im 9/27]
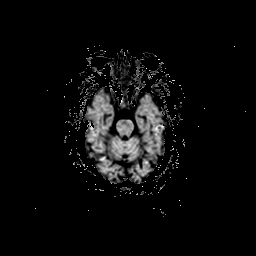
[im 18/27]
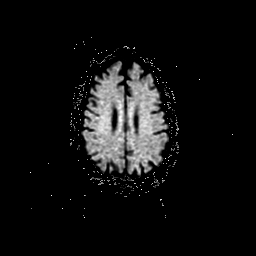
[im 27/27]
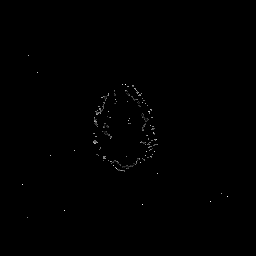

[Series 501: DWI · axial · 5.0mm · 1.41mm/px · z∈[-55,+88]mm · 4 of 27 slices shown (3 of 3)]
[im 1/27]
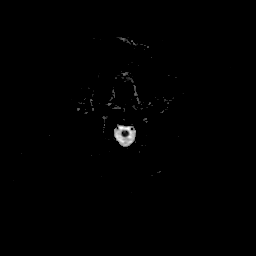
[im 9/27]
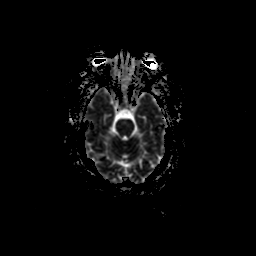
[im 18/27]
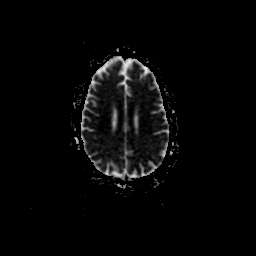
[im 27/27]
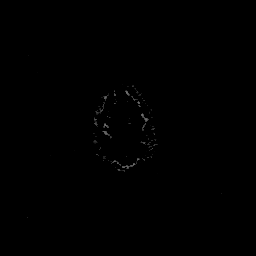

[28 of 48 positions shown; findings below may reference images not displayed]

FINDINGS: The gray-white matter differentiation is normal.  The sella appears appropriate in signal and morphology for the patient's age.  The clival signal is normal.  
The cerebellar tonsils are above the level of the foramen magnum.  The odontoid process, the predental space, and the prevertebral soft tissues are also grossly normal.  
No acute diffusion-weighted abnormality is noted.   Axial FLAIR and T2-weighted images demonstrate multiple small focal signal hyperintensities in the subcortical white matter bifrontally, the biparietal subcortical white matter, and the right centrum semiovale.  There is minimal confluent periventricular hyperintensity noted.  The ventricles are normal.  The mastoids are well-aerated.  The internal auditory canals are symmetrical in signal and morphology.
Flow voids are maintained in the major vessels at the cranial skull base.  
Moderate thickening of the mucosa is seen in the ethmoids, the sphenoid sinuses, maxillary sinuses, and to a lesser degree the frontal sinuses.  The visualized orbital contents are grossly normal.
IMPRESSION: 1.  No evidence of acute ischemia.
2.  Nonspecific subcortical white matter changes supratentorially.  Differential considerations are ischemic gliosis due to small vessel disease due to hypertension and/or diabetes versus vasculitis or demyelinating process.  These changes have also been described with migraine headaches.
3.  Mild pan sinusitis changes. 
MR ANGIOGRAPHY OF HEAD:
FINDINGS: The right internal carotid artery in its petrous and cavernous segments is widely patent.  There is a moderate decrease in caliber of the proximal supraclinoid right ICA.  The right middle and the right anterior cerebral arteries demonstrate wide patency as does the right MCA trifurcation branches.  The ACOM region is unremarkable.  
The left internal carotid artery in its petrous, cavernous, and supraclinoid segments is grossly patent.  The left middle and left anterior cerebral arteries are also widely patent. 
The vertebrobasilar junctions are patent with flow signal demonstrated in both PICAs.  The origin of the right PICA is not seen on this examination.  The posterior cerebral artery, superior cerebellar arteries, and the anterior inferior cerebellar arteries demonstrate adequate caliber and flow signal.   
Suggestion of the dominant PCOMs are seen.
IMPRESSION: 1.  Decreased caliber of the right internal carotid artery in its proximal supraclinoid segment which could represent a moderate stenosis secondary to atherosclerosis.
2.  No evidence of intracranial aneurysm is seen on the images provided.

## 2008-05-25 ENCOUNTER — Ambulatory Visit: Payer: Self-pay | Admitting: Internal Medicine

## 2008-05-29 IMAGING — MR MR CERVICAL SPINE W/O CM
5 of 7 series · 18 of 48 positions shown · IV contrast (agent unspecified)
Comparison: None.  Prior brain MRI 07/22/07.

CLINICAL DATA: 56-year-old male with neck pain, left arm weakness.  No known injury. 
 MRI CERVICAL SPINE WITHOUT CONTRAST:
TECHNIQUE: Multiplanar and multiecho pulse sequences of the cervical spine, to include the craniocervical junction and cervicothoracic junction, were obtained according to standard protocol without IV contrast.

[Series 3: T2 · sagittal · 3.0mm · 0.49mm/px · 2 of 11 slices shown (1 of 2)]
[im 1/11]
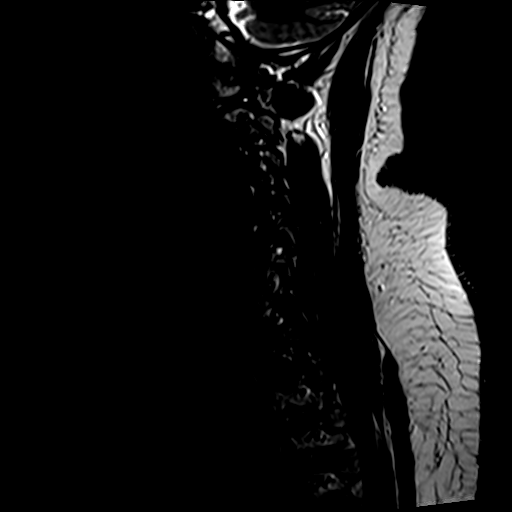
[im 11/11]
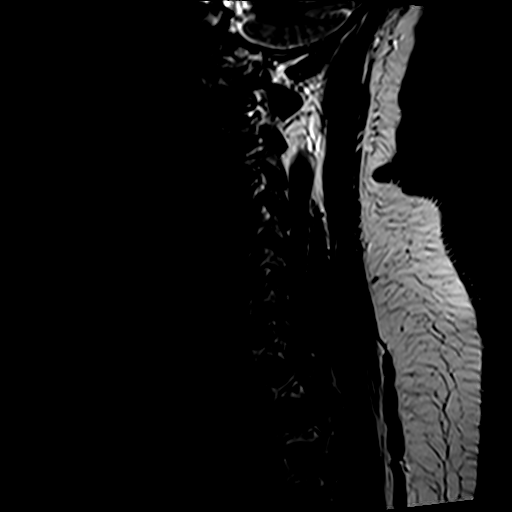

[Series 4: STIR · sagittal · 3.0mm · 0.49mm/px · 3 of 11 slices shown]
[im 1/11]
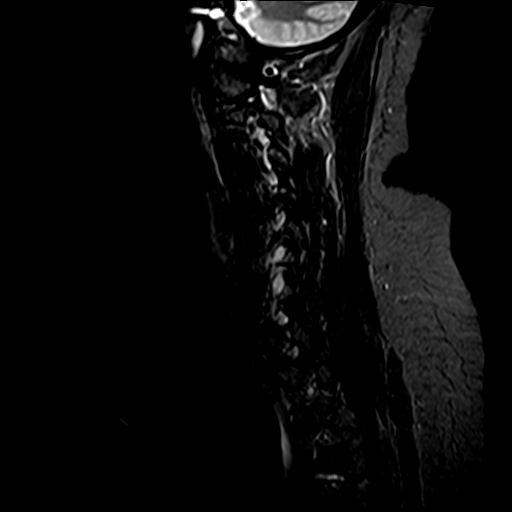
[im 6/11]
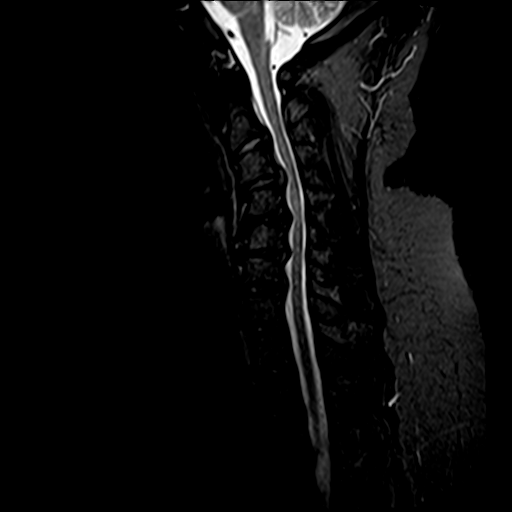
[im 11/11]
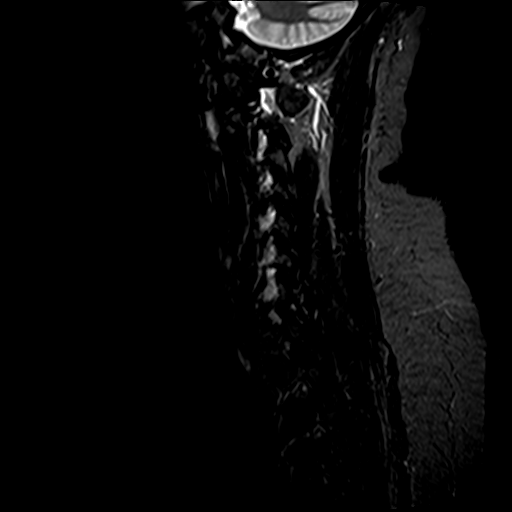

[Series 5: GRE · axial · 4.5mm · 0.49mm/px · z∈[-39,+67]mm · 5 of 21 slices shown]
[im 1/21]
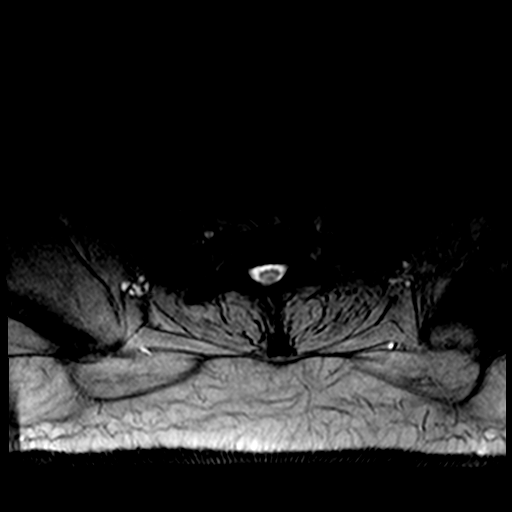
[im 6/21]
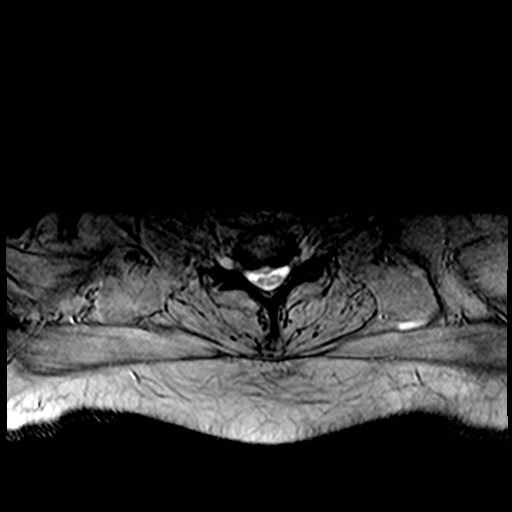
[im 11/21]
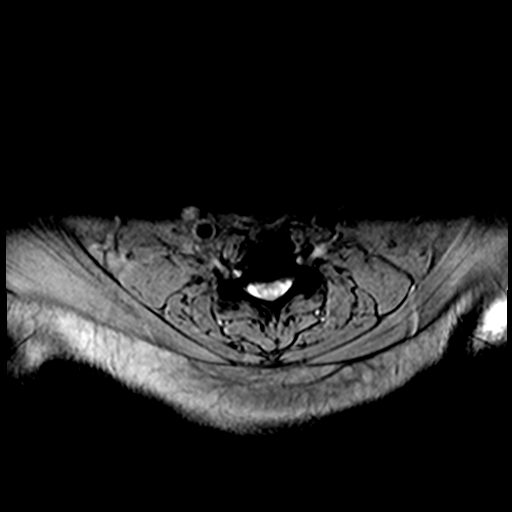
[im 16/21]
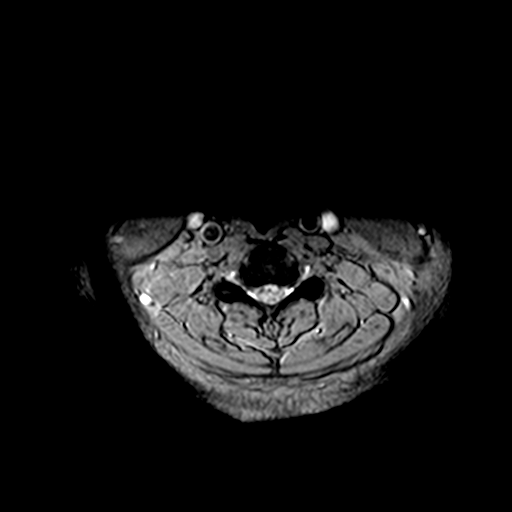
[im 21/21]
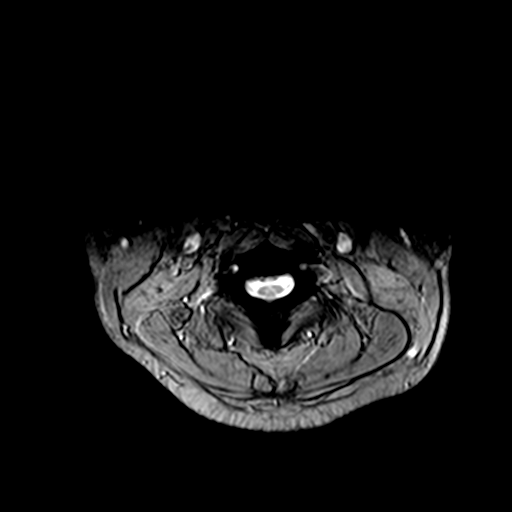

[Series 6: T1 · sagittal · 3.0mm · 0.49mm/px · 3 of 11 slices shown]
[im 1/11]
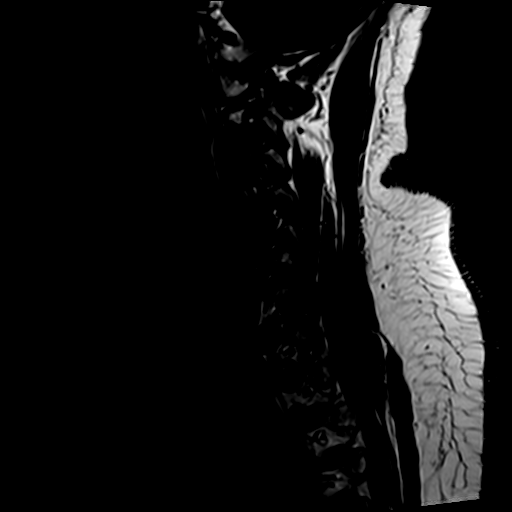
[im 6/11]
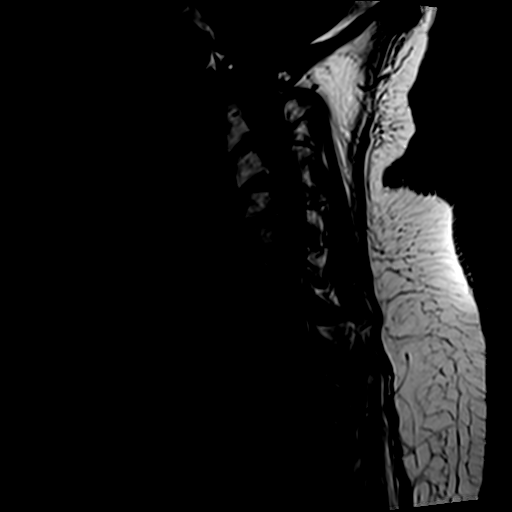
[im 11/11]
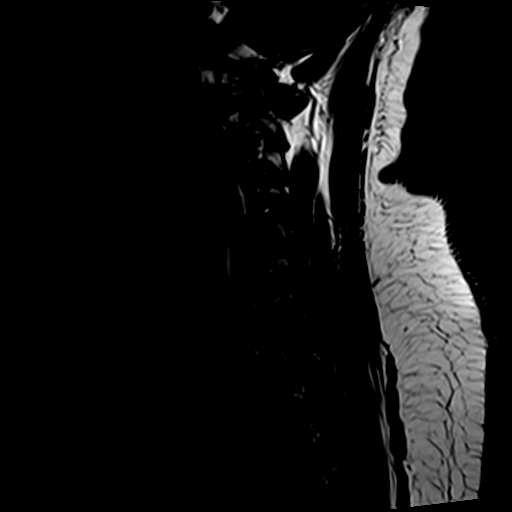

[Series 8: T2 · axial · 4.5mm · 0.49mm/px · z∈[-39,+67]mm · 5 of 21 slices shown (2 of 2)]
[im 1/21]
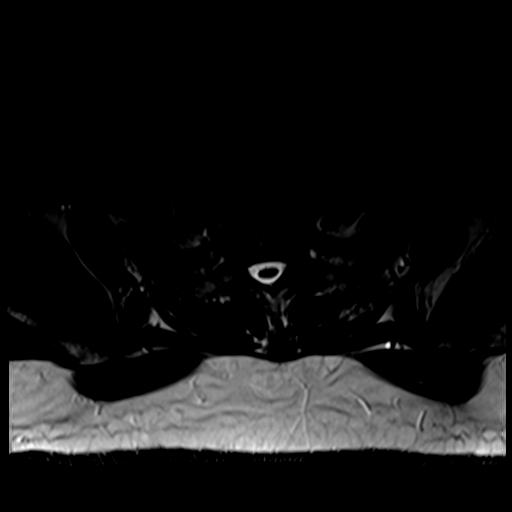
[im 6/21]
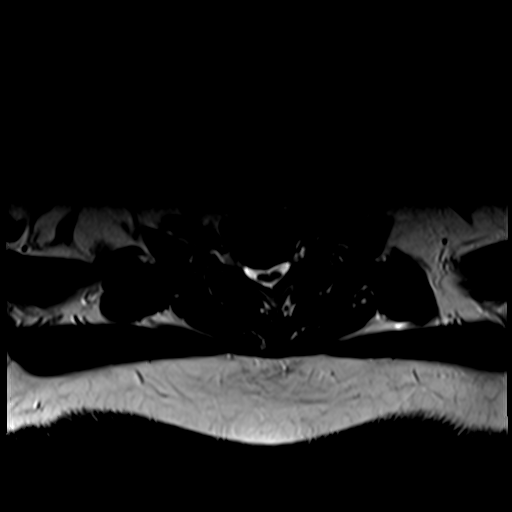
[im 11/21]
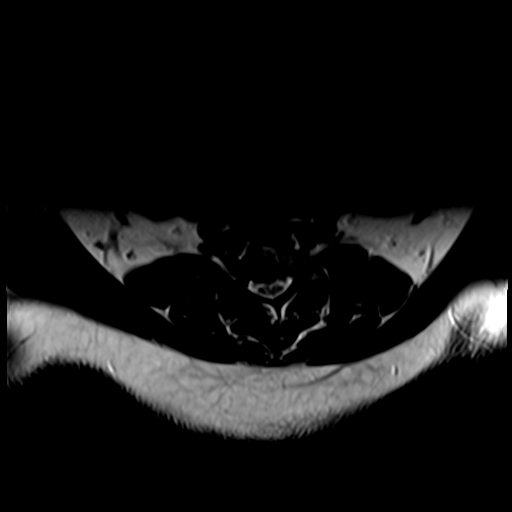
[im 16/21]
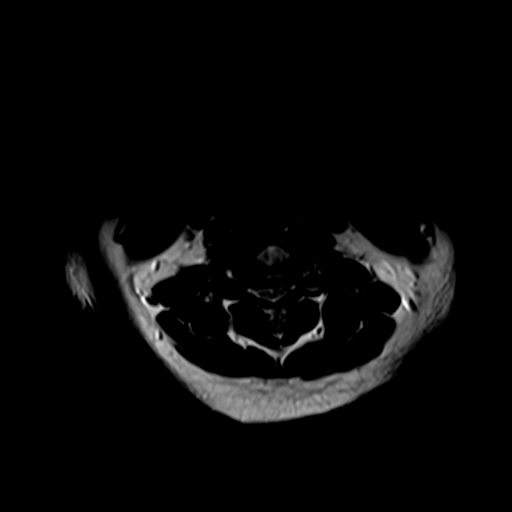
[im 21/21]
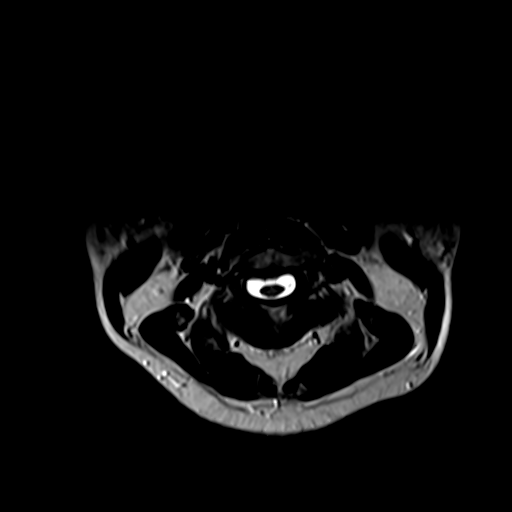

[18 of 48 positions shown; findings below may reference images not displayed]

FINDINGS: Cervicomedullary junction is within normal limits.  Visualized paraspinal soft tissues are remarkable for left thyromegaly.  
 There is reversal of cervical lordosis from C2-C6.  Vertebral marrow signal is within normal limits aside from degenerative end plate sclerosis, no evidence of acute osseous abnormality.  There is diffuse congenital spinal canal narrowing.  This is exacerbated by multilevel severe degenerative findings resulting in multilevel spinal stenosis as follows:
 C2-3:  Right foraminal and paracentral disc protrusion resulting in moderate to severe right C-3 neural foraminal stenosis.  Mild flattening of the spinal cord.  No definite cord signal abnormality.
 C3-4:  Diffuse disc bulge and probable uncovertebral hypertrophy.  The AP thecal sac is reduced to approximately 4.6 mm and there is moderate to severe spinal cord flattening.  There is increased T2- signal within the cord at this level.  There is moderate to severe bilateral neural foraminal stenosis.  
 C4-5:  Diffuse disc bulge and uncovertebral hypertrophy.  AP thecal sac measures roughly 5.4 mm.  Increased spinal cord signal and severe flattening.  Moderate to severe bilateral neural foraminal stenosis.  
 C5-6:  Diffuse disc bulge and uncovertebral hypertrophy.  Bilateral moderate to severe neural foraminal stenosis.  Flattening of the spinal cord is less severe than at the levels above.  AP thecal sac approximates 5.9 mm.  Still, increased T2- cord signal is suspected at this level.  
 C6-7:  Broad based central disc protrusion flattens the ventral spinal cord surface.  Mild increased spinal cord signal.  The spinal cord has a somewhat atrophic appearance at this and the subsequent more caudal levels.   There is no definite neural foraminal stenosis. 
 C7-T1:  Normal aside from somewhat atrophic appearance of the spinal cord. 
 The other visualized upper thoracic levels are remarkable for mild disc bulges at T3-T4 and T4-T5.  No thoracic cord signal abnormality.
IMPRESSION: 1.  Multilevel severe spinal stenosis and spinal cord compression C3-C4 through C5-C6, most pronounced at C3-C4.  There is associated spinal cord signal abnormality which may reflect chronic myelomalacia. 
 2.  Diffuse moderate to severe neural foraminal stenosis throughout the cervical spine related to disc and uncovertebral degeneration. 
 3.  Left lobe thyromegaly, correlate for the possibility of thyroid goiter.  Consider dedicated thyroid ultrasound/nuclear medicine imaging as indicated.

## 2008-06-11 HISTORY — PX: CORONARY ANGIOPLASTY WITH STENT PLACEMENT: SHX49

## 2008-06-22 ENCOUNTER — Ambulatory Visit: Payer: Self-pay | Admitting: Vascular Surgery

## 2008-07-09 ENCOUNTER — Ambulatory Visit (HOSPITAL_COMMUNITY): Admission: RE | Admit: 2008-07-09 | Discharge: 2008-07-09 | Payer: Self-pay | Admitting: Vascular Surgery

## 2008-07-09 ENCOUNTER — Ambulatory Visit: Payer: Self-pay | Admitting: Vascular Surgery

## 2008-07-19 ENCOUNTER — Encounter: Payer: Self-pay | Admitting: Endocrinology

## 2008-07-19 ENCOUNTER — Inpatient Hospital Stay (HOSPITAL_COMMUNITY): Admission: EM | Admit: 2008-07-19 | Discharge: 2008-07-23 | Payer: Self-pay | Admitting: Emergency Medicine

## 2008-07-27 ENCOUNTER — Ambulatory Visit: Payer: Self-pay | Admitting: Vascular Surgery

## 2008-08-03 ENCOUNTER — Telehealth (INDEPENDENT_AMBULATORY_CARE_PROVIDER_SITE_OTHER): Payer: Self-pay | Admitting: *Deleted

## 2008-08-12 ENCOUNTER — Ambulatory Visit: Payer: Self-pay | Admitting: Endocrinology

## 2008-08-12 DIAGNOSIS — M79609 Pain in unspecified limb: Secondary | ICD-10-CM | POA: Insufficient documentation

## 2008-08-12 HISTORY — DX: Pain in unspecified limb: M79.609

## 2008-08-17 ENCOUNTER — Ambulatory Visit: Payer: Self-pay | Admitting: Vascular Surgery

## 2008-08-24 IMAGING — CR DG CHEST 1V PORT
1 series · 1 of 1 positions shown · non-contrast
Comparison: Earlier the same day.

CLINICAL DATA: Endotracheal tube placement.
 CHEST PORTABLE - 1 VIEW ? 11/03/07 AT 4175 HOURS:

[view not recorded]
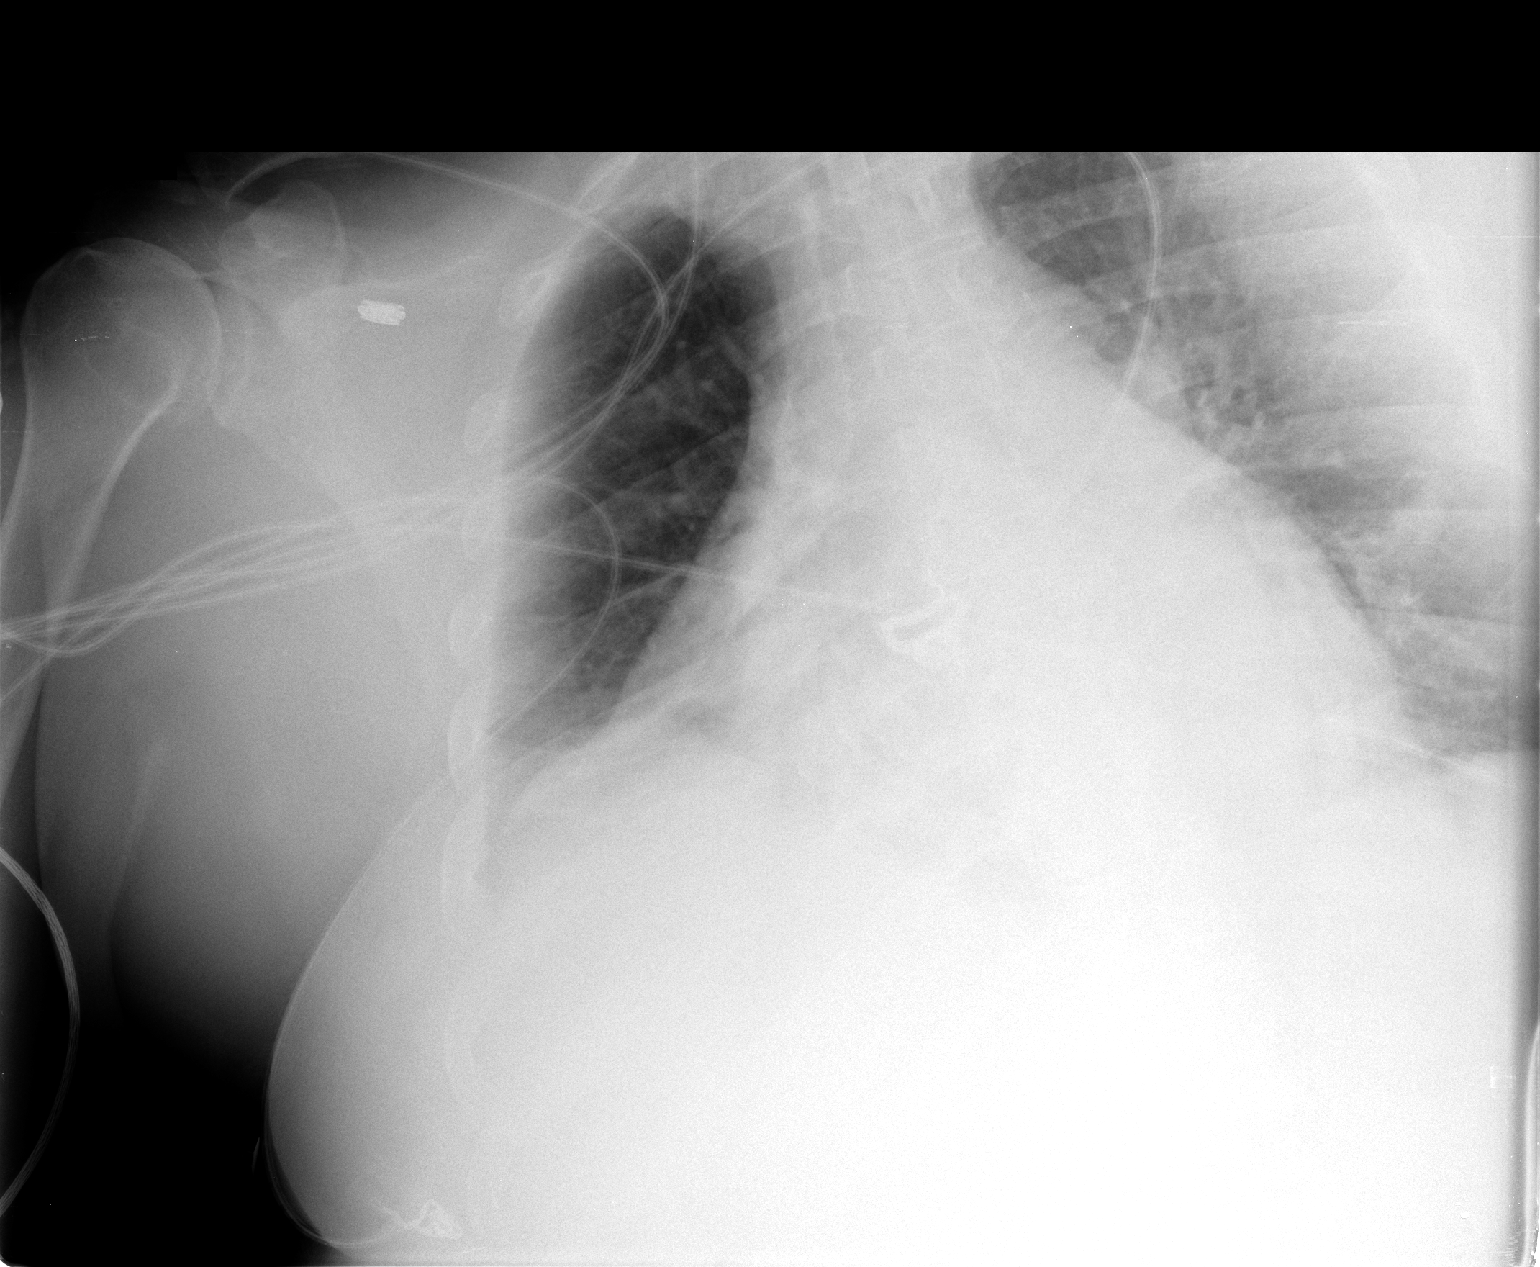

[1 of 1 positions shown; findings below may reference images not displayed]

FINDINGS: The endotracheal tube has its tip 6 cm above the carina.  There is mild volume loss at the right base.  Edema is improved since earlier.
IMPRESSION: As discussed above.

## 2008-08-24 IMAGING — CR DG CHEST 1V PORT
1 series · 1 of 1 positions shown · non-contrast
Comparison: 11/03/07

CLINICAL DATA: 56-year-old postop cervical surgery.
 PORTABLE CHEST - 1 VIEW:

[view not recorded]
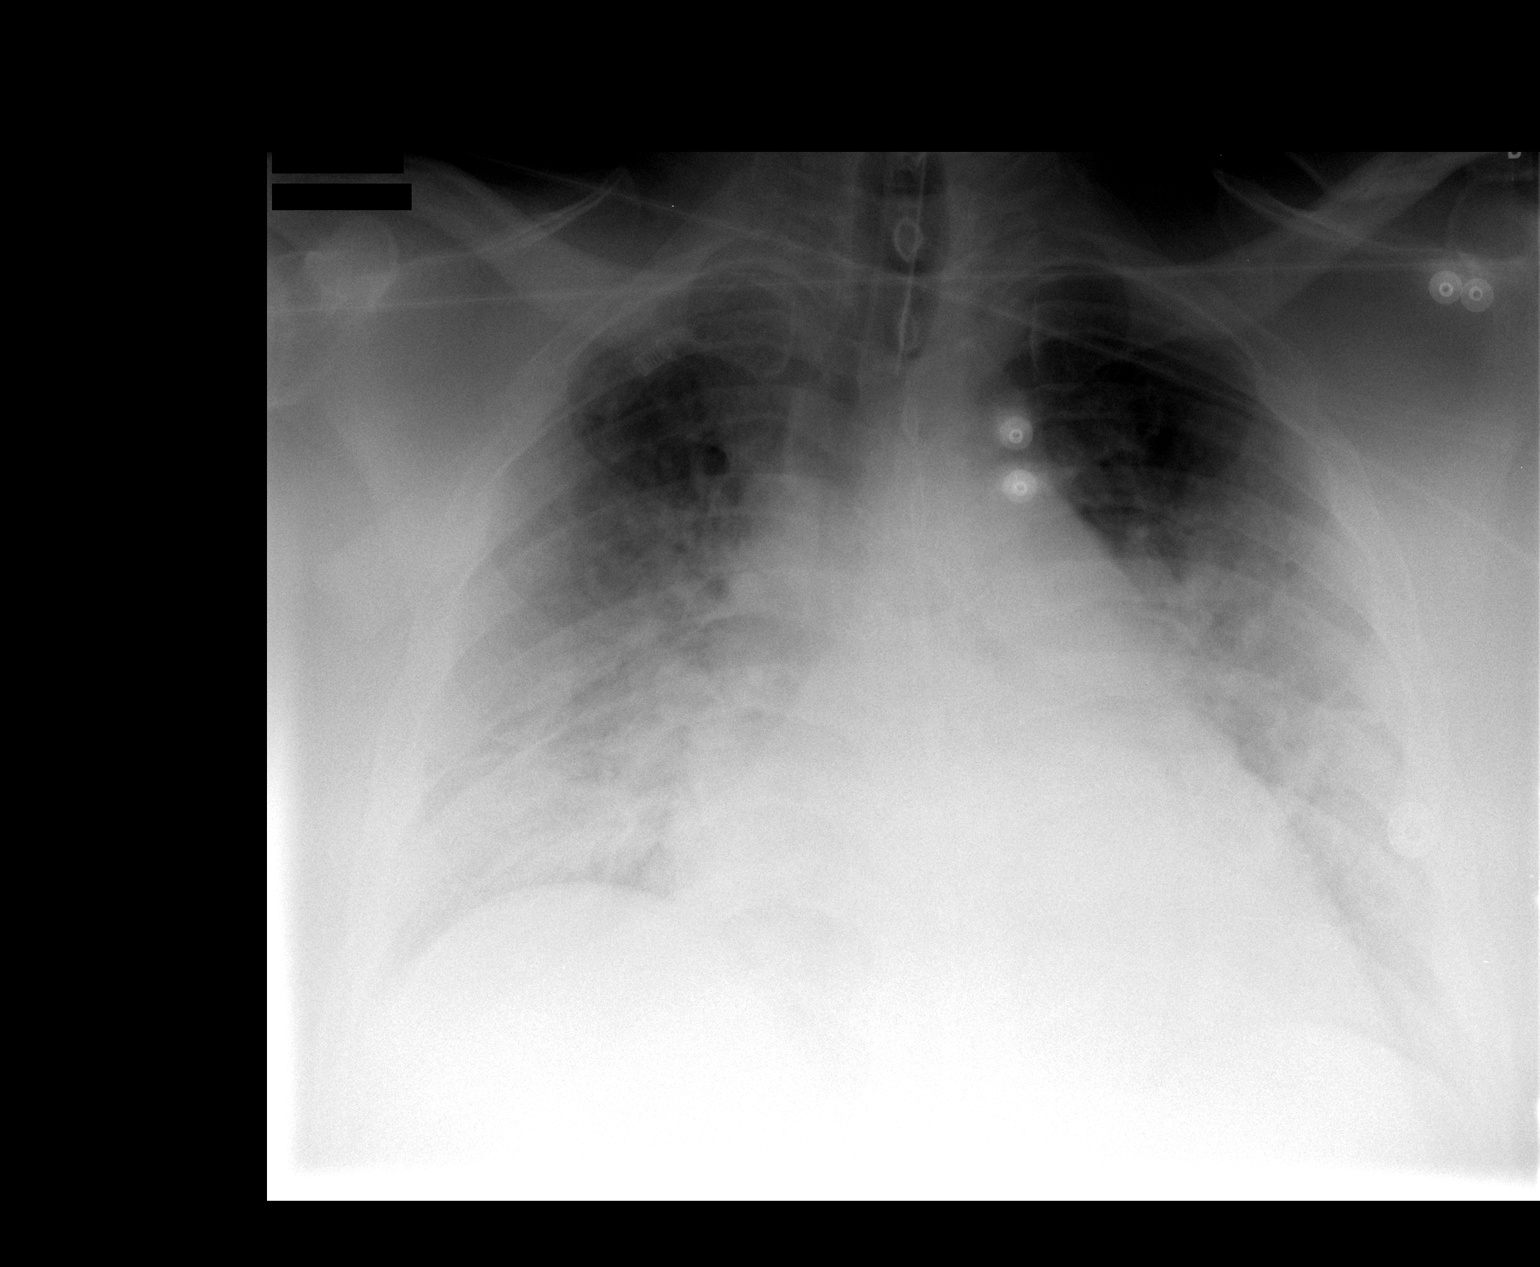

[1 of 1 positions shown; findings below may reference images not displayed]

FINDINGS: The endotracheal tube is in good position at the mid tracheal level approximately 6 cm above the carina. The heart is mildly enlarged. There is pulmonary edema without pleural effusion.
IMPRESSION: 1.  Endotracheal tube in good position at the mid tracheal level.
 2.  Cardiac enlargement and pulmonary edema.

## 2008-08-24 IMAGING — CR DG CHEST 1V PORT
1 series · 1 of 1 positions shown · non-contrast
Comparison: none

CLINICAL DATA: Preop cervical surgery.  
 PORTABLE CHEST ? 1 VIEW ? 11/03/07 ? 8868 HOURS:

[view not recorded]
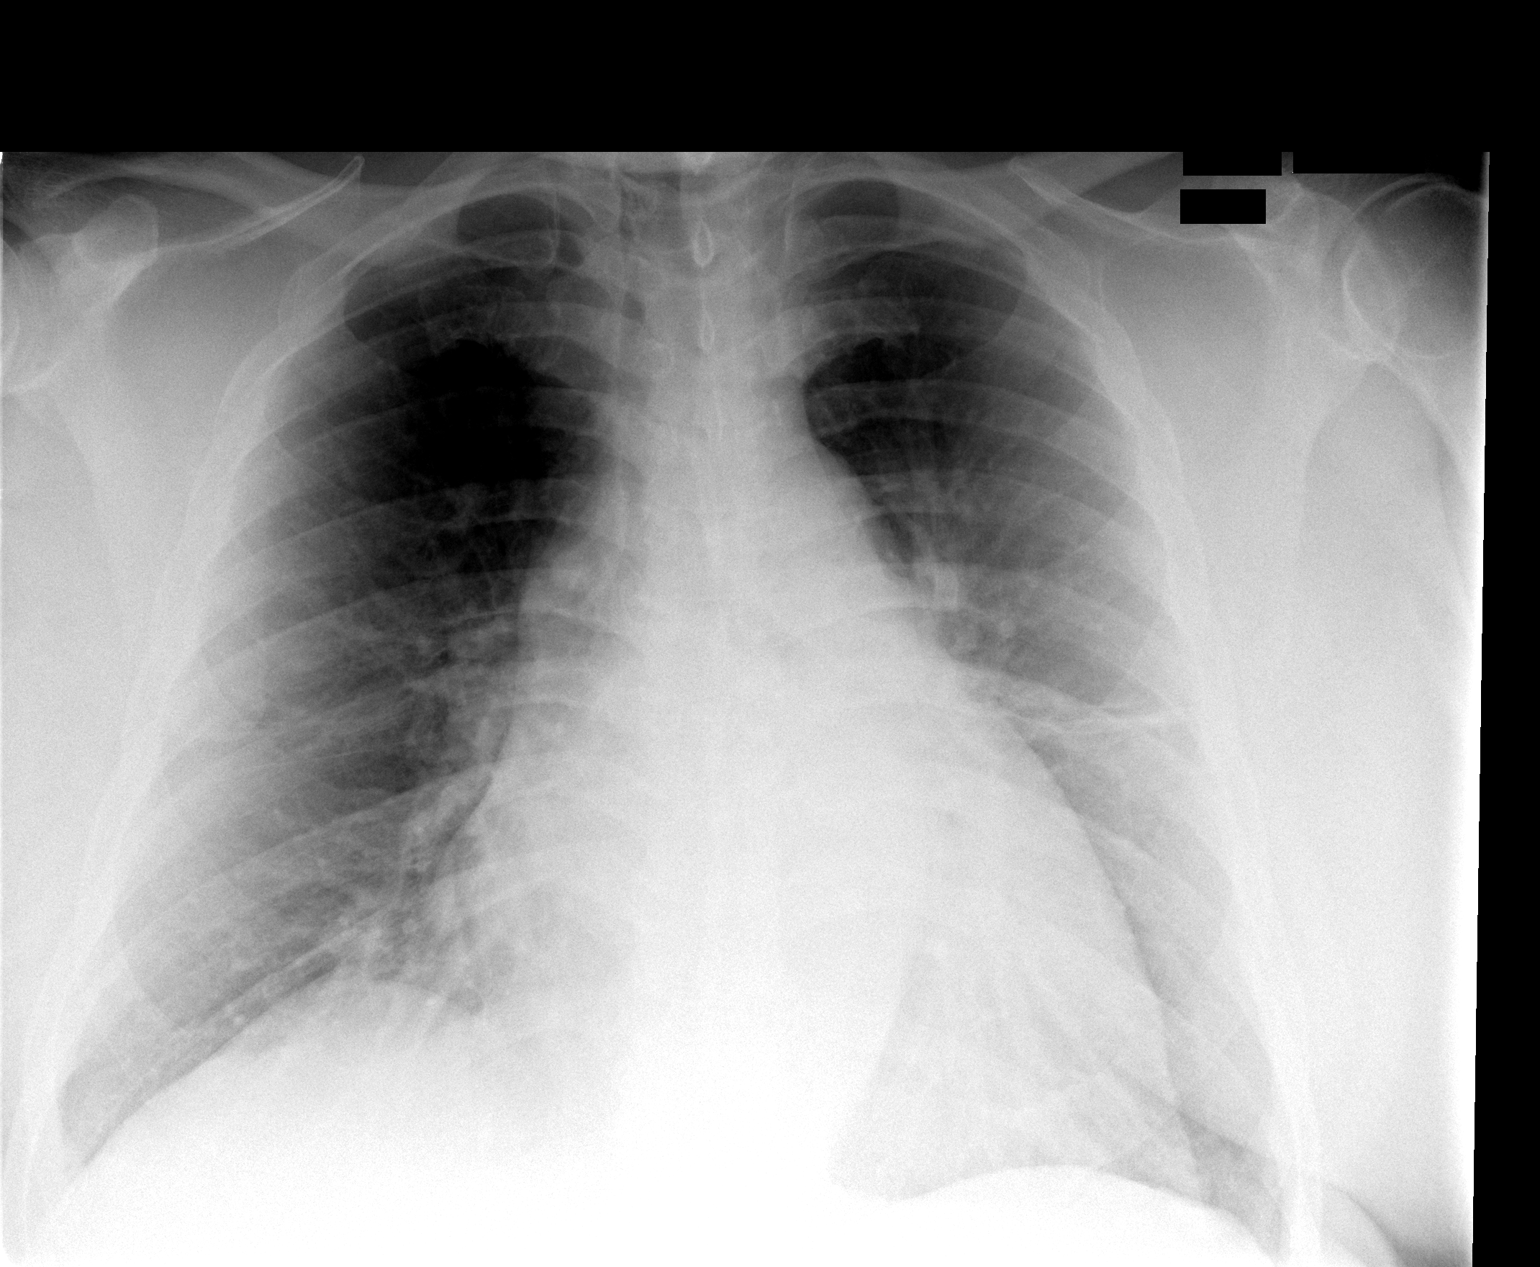

[1 of 1 positions shown; findings below may reference images not displayed]

FINDINGS: The heart is mildly enlarged.  The aorta is tortuous likely due to hypertension.  Linear scarring in the left midlung zone is stable.  Lungs are otherwise clear.  No pneumothoraces are effusions are seen.  Pulmonary vascularity is within normal limits.
IMPRESSION: Cardiomegaly without CHF.  Linear scaring in the left midlung.

## 2008-08-24 IMAGING — RF DG CERVICAL SPINE 2 OR 3 VIEWS
1 series · 2 of 2 positions shown · non-contrast
Comparison: none

CLINICAL DATA: 56-year-old. Cervical fusion.
 CERVICAL SPINE - 2 VIEW:

[Series 1: run · 2 of 2 slices shown]
[im 1/2]
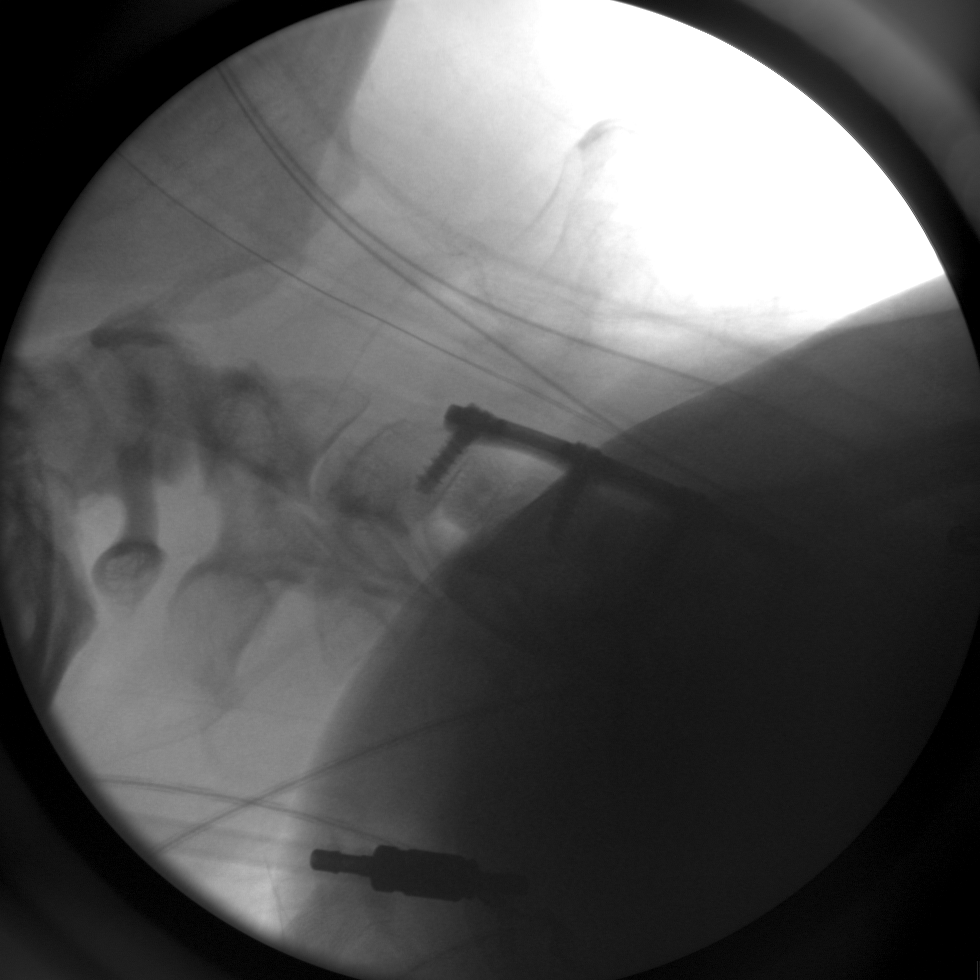
[im 2/2]
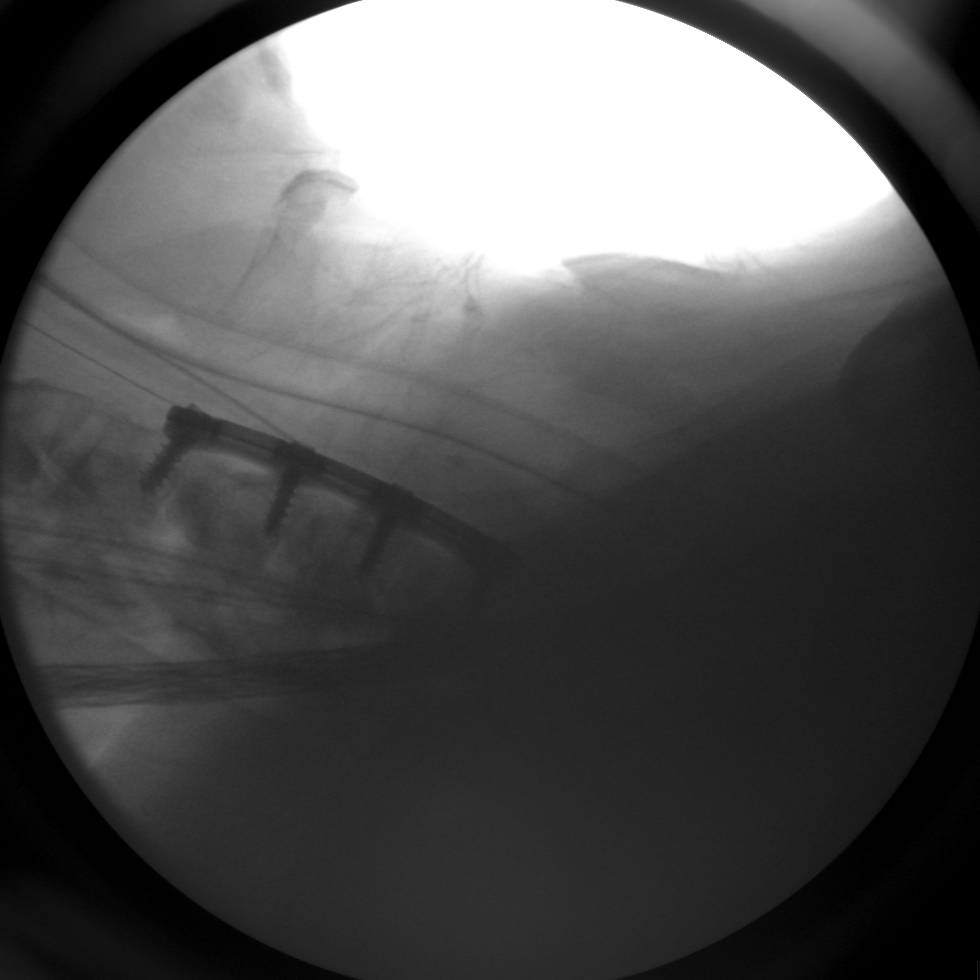

[2 of 2 positions shown; findings below may reference images not displayed]

FINDINGS: Two lateral cervical spine films from the operating room demonstrate anterior fusion hardware from C3-C6 with interbody bone plugs. Normal alignment and no complicating features are demonstrated.
IMPRESSION: C3-C6 anterior and interbody fusion.

## 2008-08-25 IMAGING — CR DG CHEST 1V PORT
1 series · 1 of 1 positions shown · non-contrast
Comparison: 11/03/07.

CLINICAL DATA: Respiratory failure ? on ventilator.
 PORTABLE CHEST - 1 VIEW - 11/04/07:

[view not recorded]
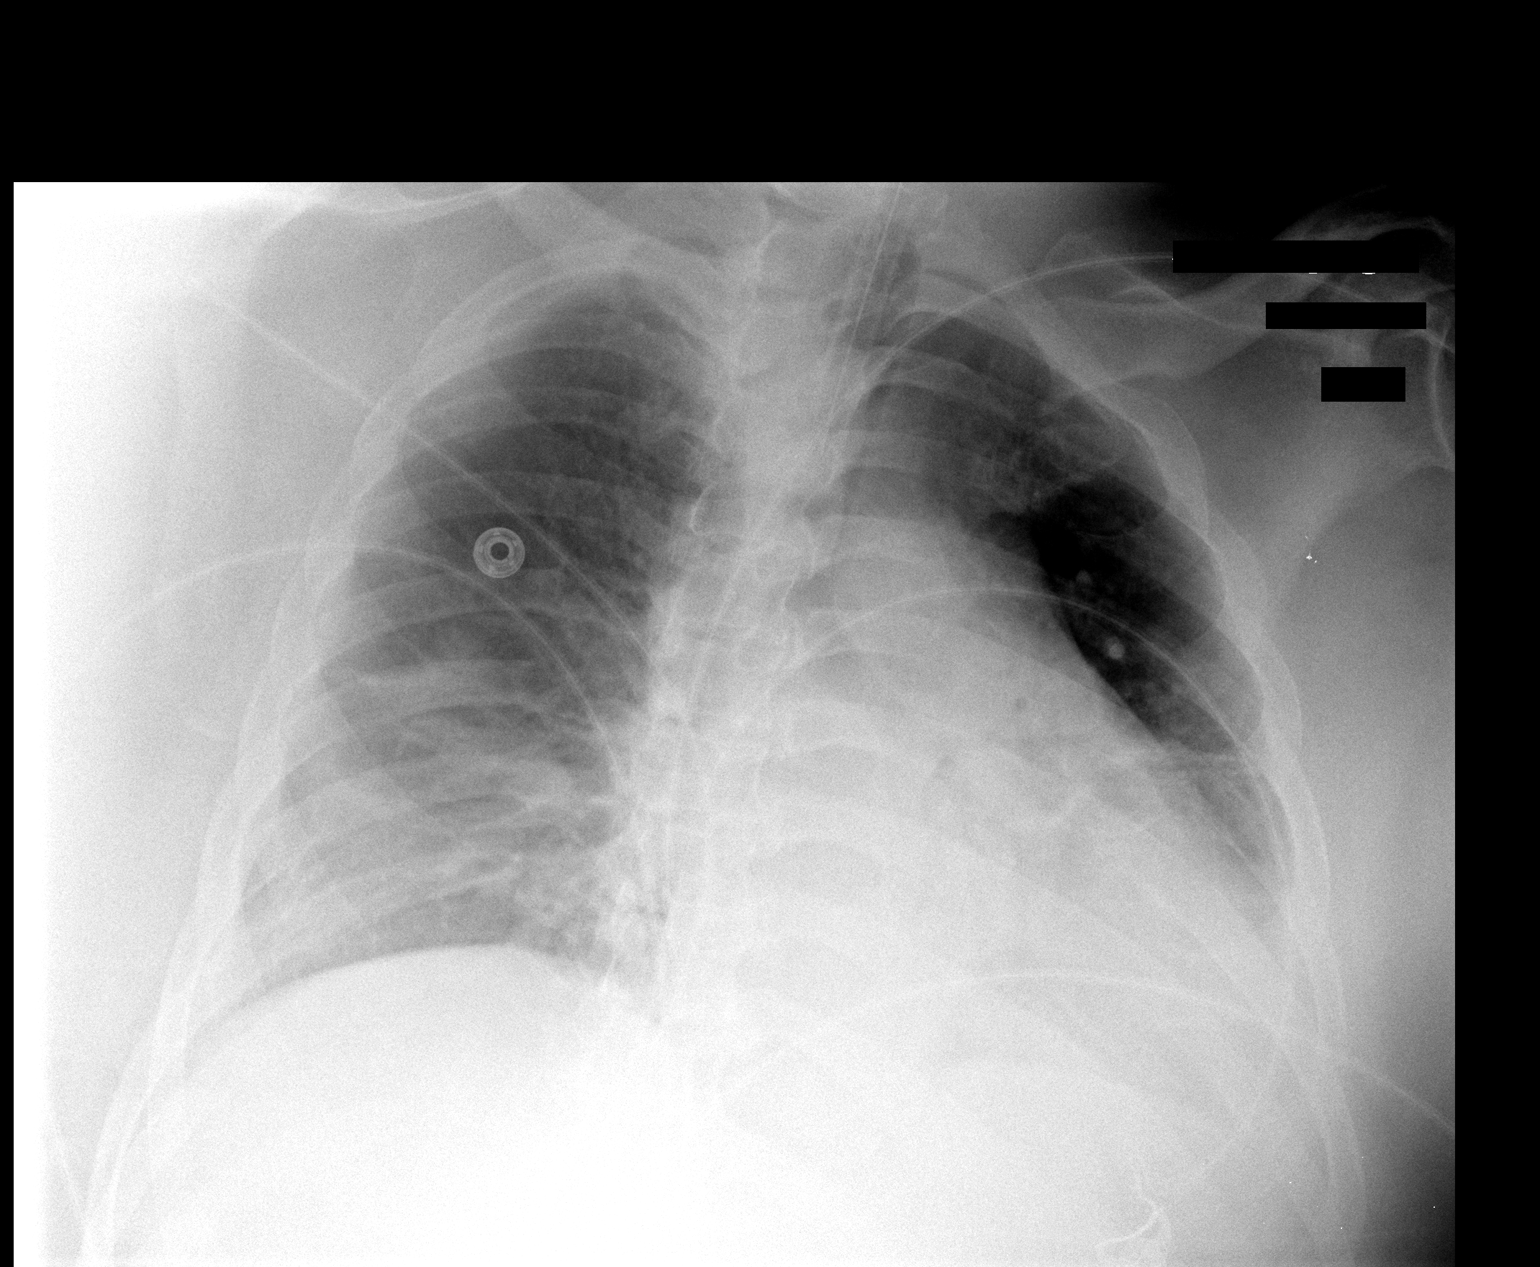

[1 of 1 positions shown; findings below may reference images not displayed]

FINDINGS: The patient is rotated to the left.  This makes accurate comparison difficult.  There is no frank pulmonary edema.  There is some streaky air space disease in the right lower lobe that appears new.  The endotracheal tube remains in place.
IMPRESSION: 1.   The patient is significantly rotated on today?s exam.
 2.  There appears to be increasing right lower lobe air space disease.

## 2008-08-27 IMAGING — CR DG FOOT COMPLETE 3+V*R*
3 series · 3 of 3 positions shown · non-contrast
Comparison: None.

CLINICAL DATA: 56-year-old, mid-foot pain.  No known injury. 
 RIGHT FOOT ? 3 VIEW:

[view not recorded (1 of 3)]
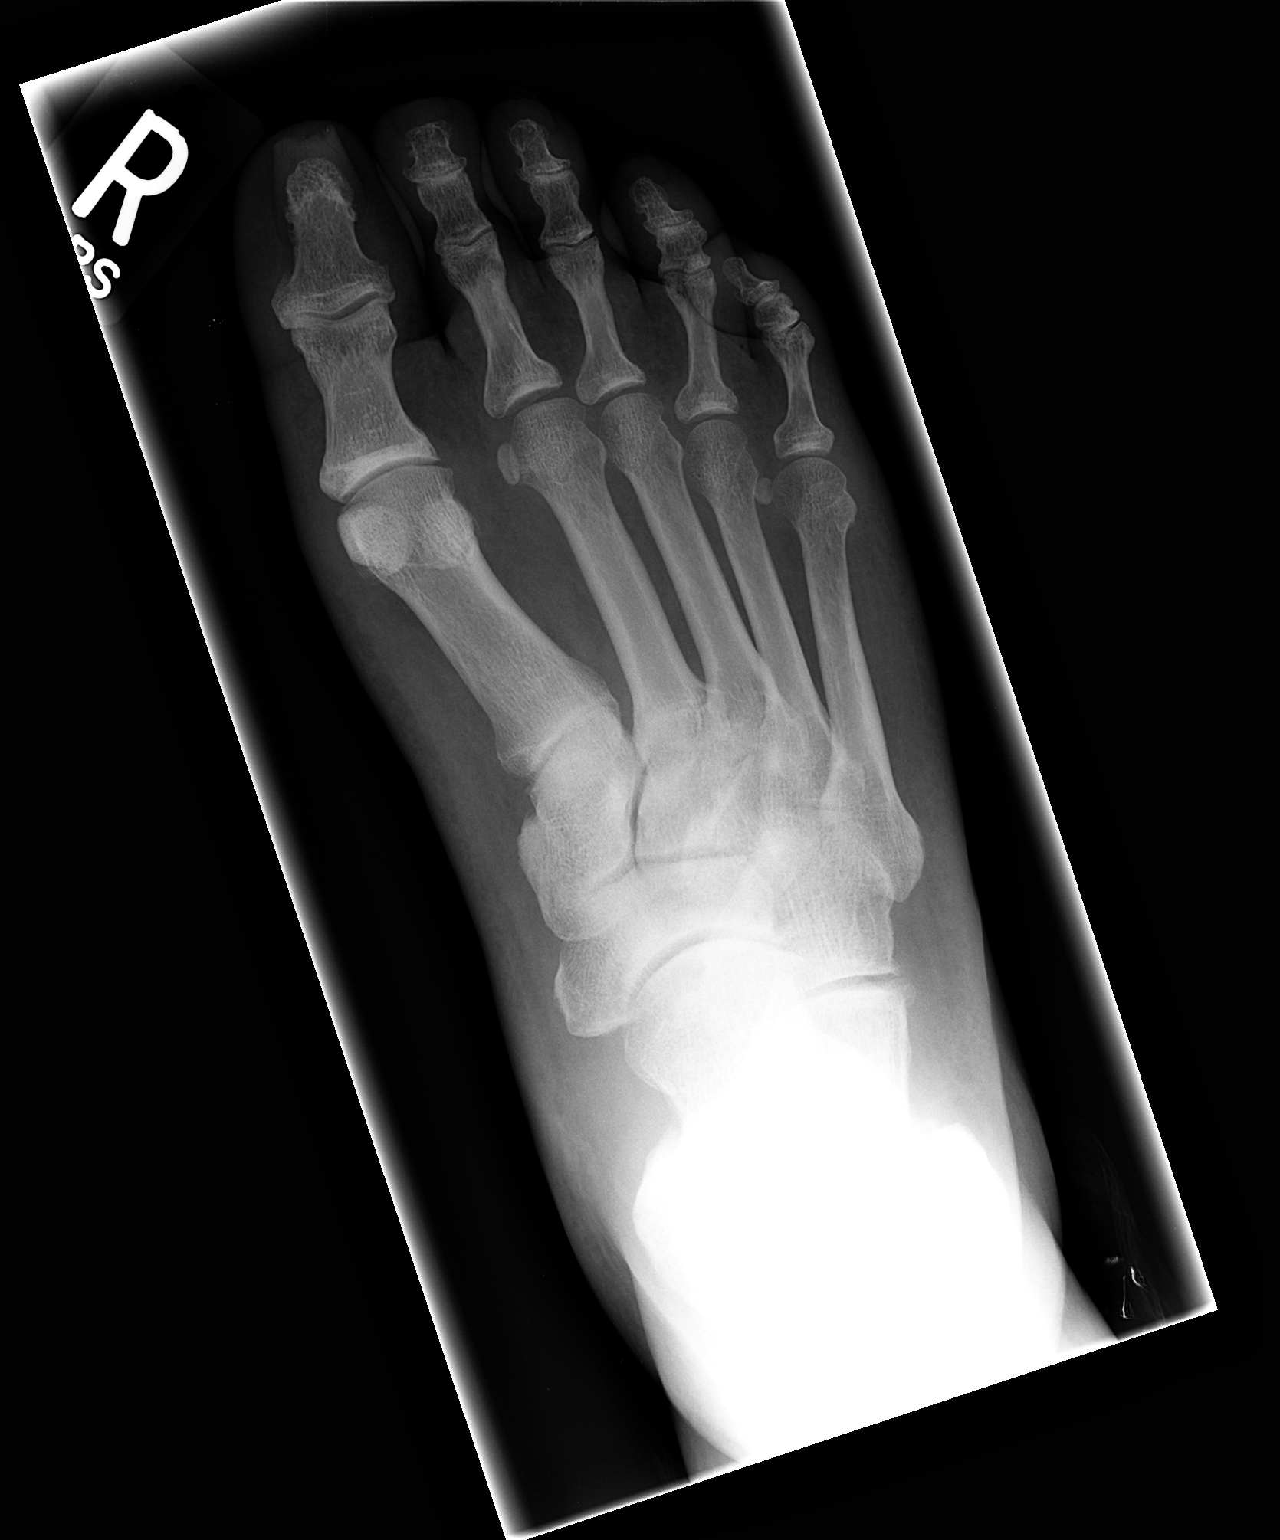

[view not recorded (2 of 3)]
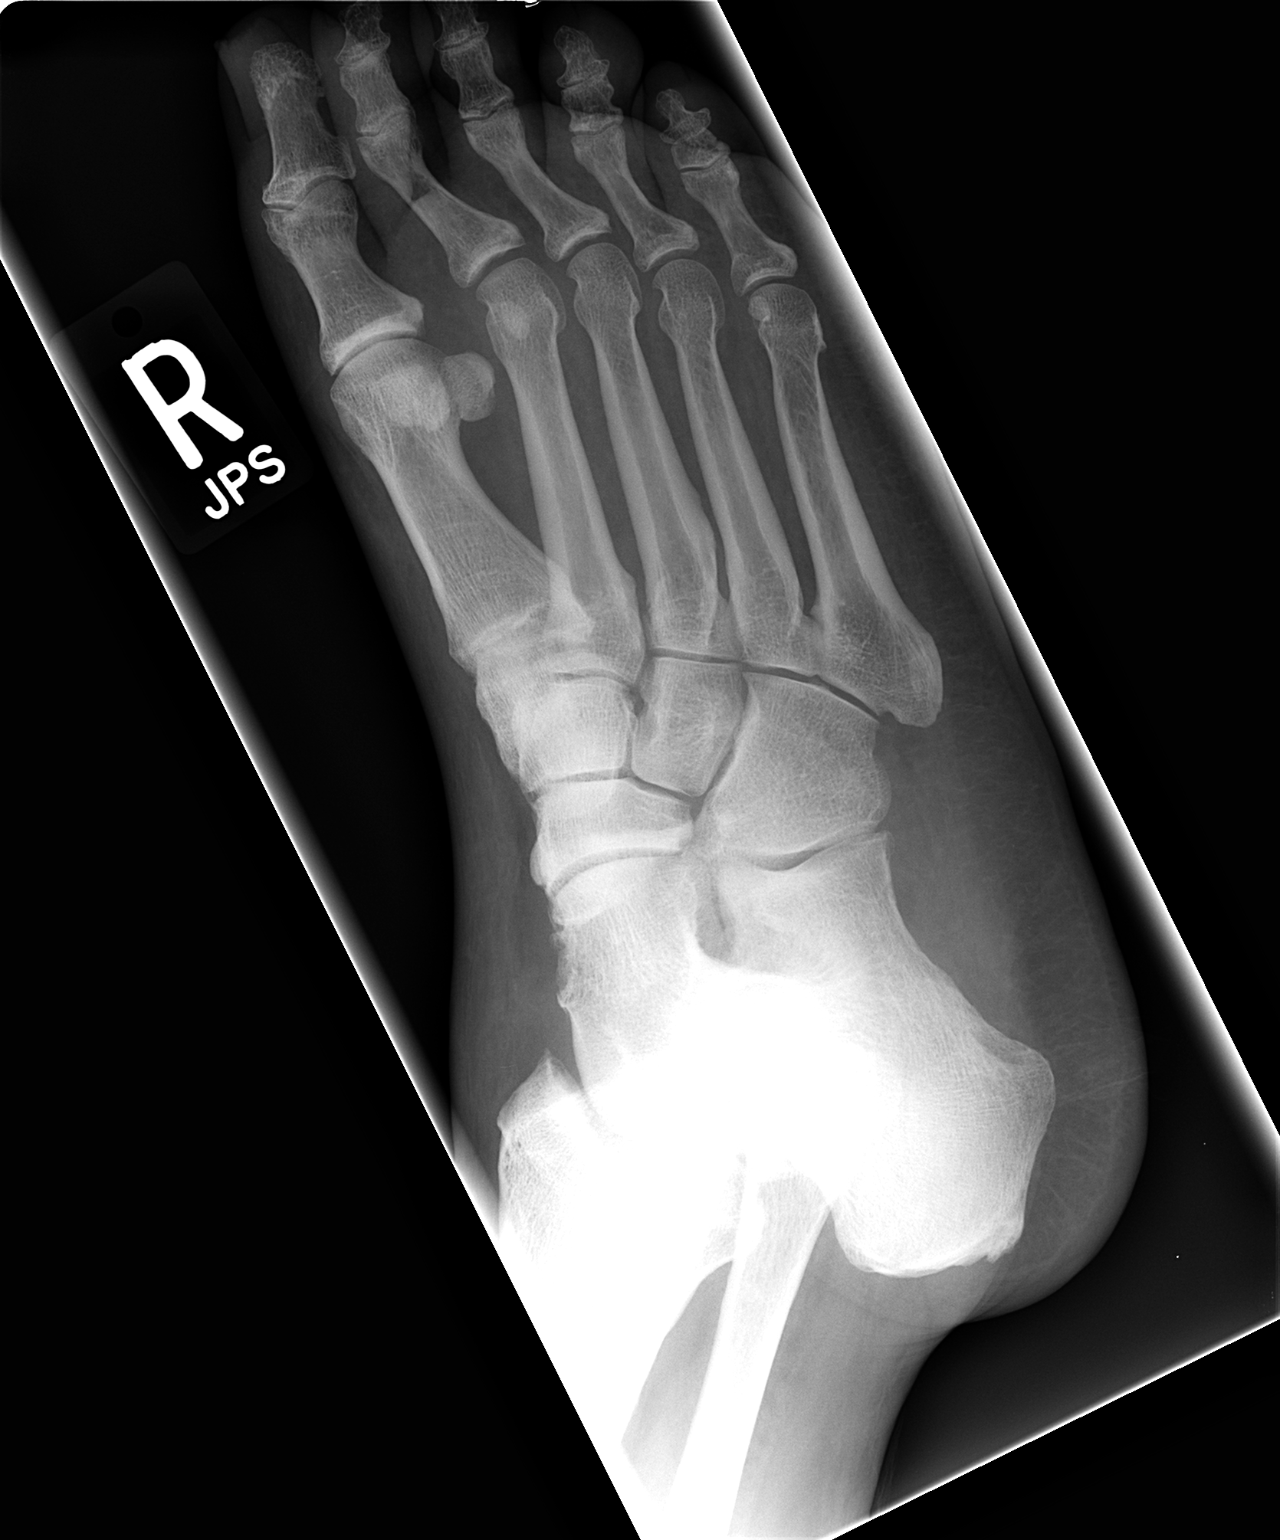

[view not recorded (3 of 3)]
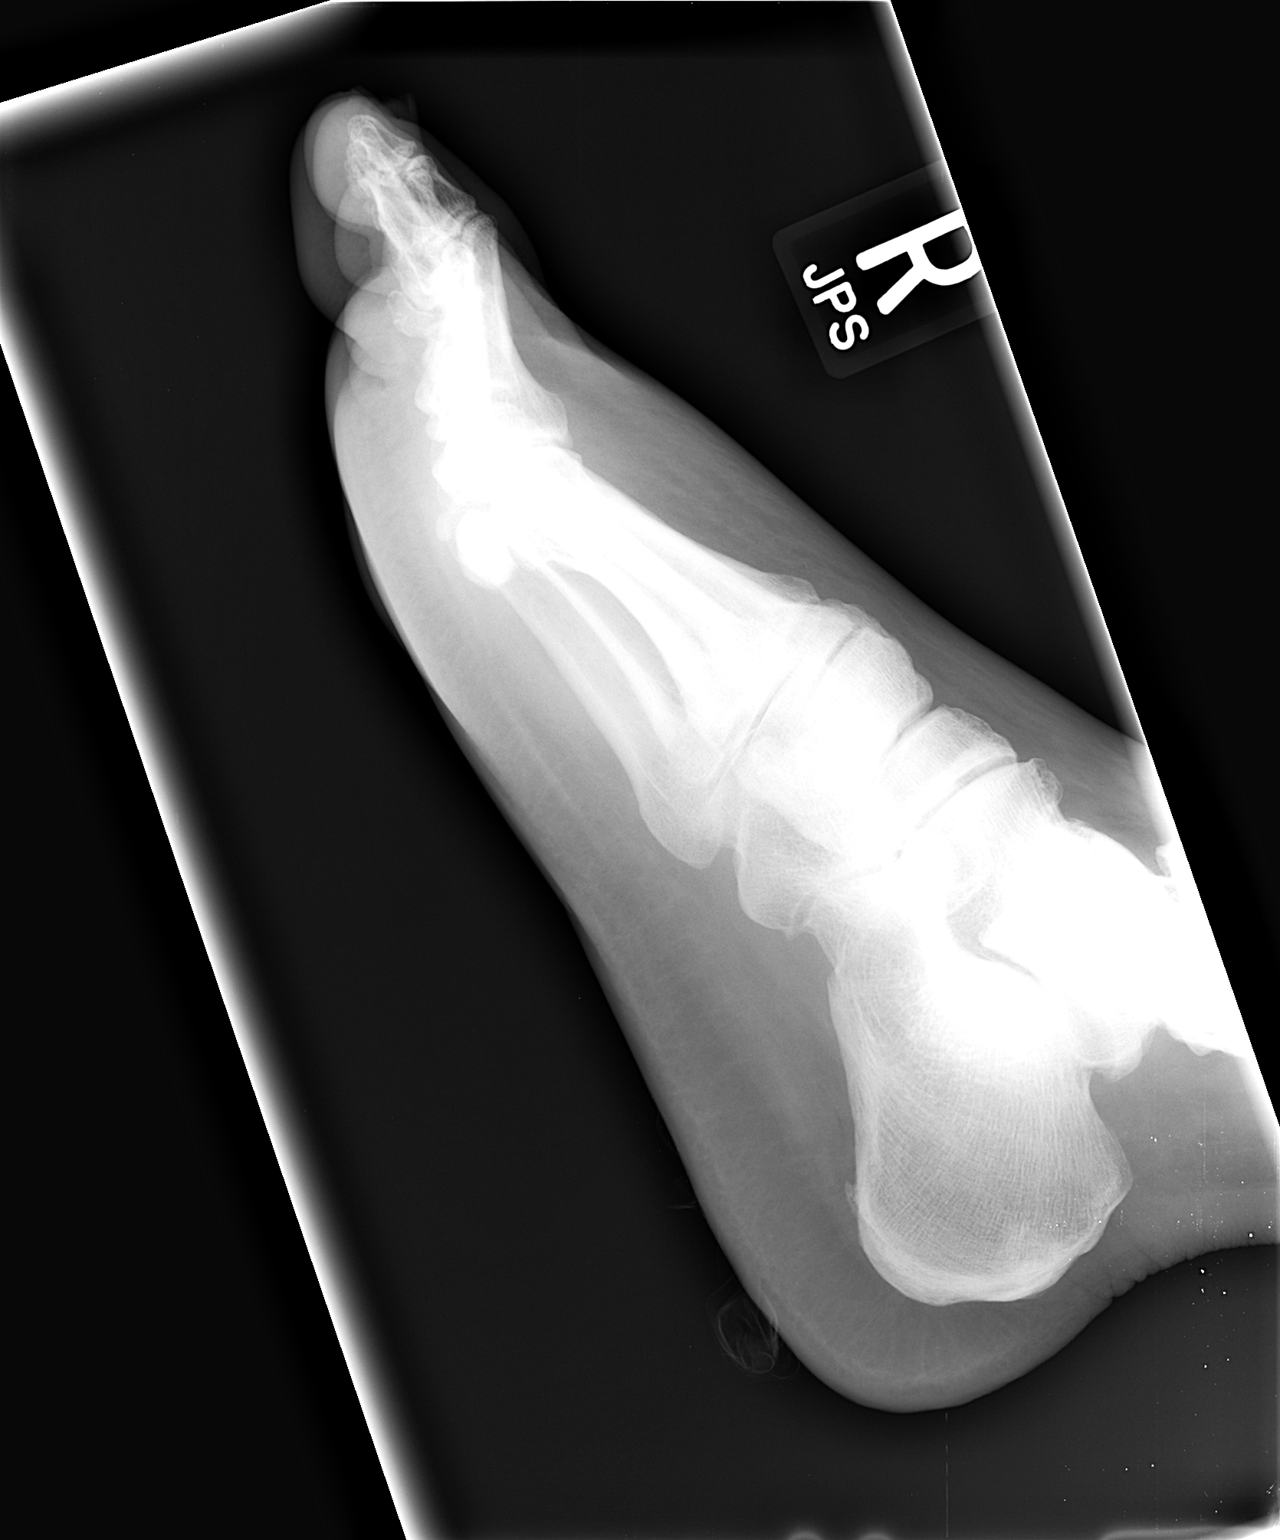

[3 of 3 positions shown; findings below may reference images not displayed]

FINDINGS: The joint spaces are maintained.   No fractures are seen.  No significant degenerative changes.  Tiny calcaneal heel spur is noted.
IMPRESSION: No acute bony findings or significant degenerative changes.

## 2008-09-13 ENCOUNTER — Encounter: Payer: Self-pay | Admitting: Endocrinology

## 2008-09-14 ENCOUNTER — Ambulatory Visit: Payer: Self-pay | Admitting: Internal Medicine

## 2008-09-18 IMAGING — CT CT HEAD W/O CM
1 series · 16 of 30 positions shown, 20 images · IV contrast (agent unspecified)
Comparison: 04/02/07.

CLINICAL DATA: 56-year-old sent by doctor, high blood pressure, headache, chest pain, shortness of breath, jaw pain for 2 days.  History of CHF, COPD, diabetes, and hypertension.  
 HEAD CT WITHOUT CONTRAST:
TECHNIQUE: Contiguous axial images were obtained from the base of the skull through the vertex according to standard protocol without contrast.

[Series 2: brain · axial · 0.47mm/px · z∈[+152,+297]mm · 16 of 30 slices shown, 20 images]
[im 2/30  brain]
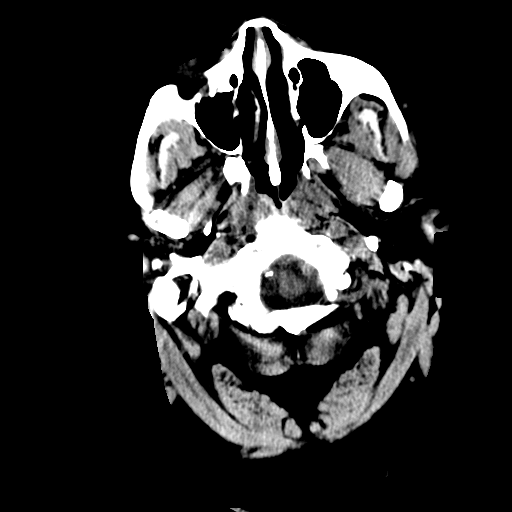
[im 2/30  bone]
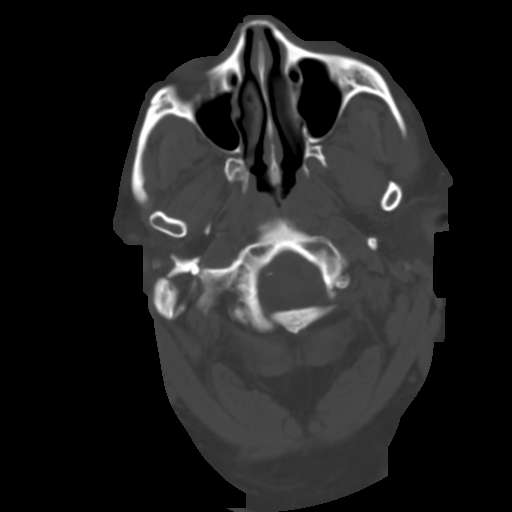
[im 4/30  brain]
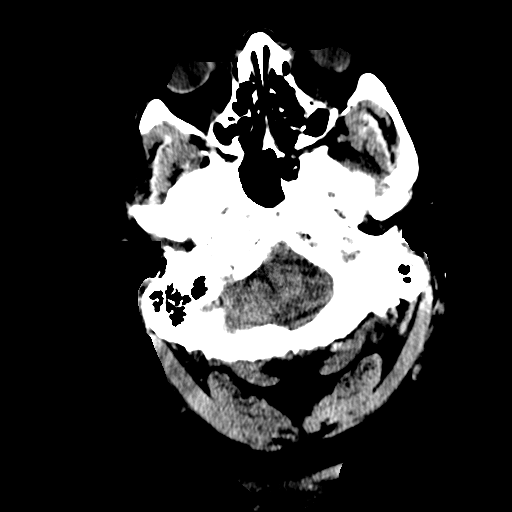
[im 6/30  brain]
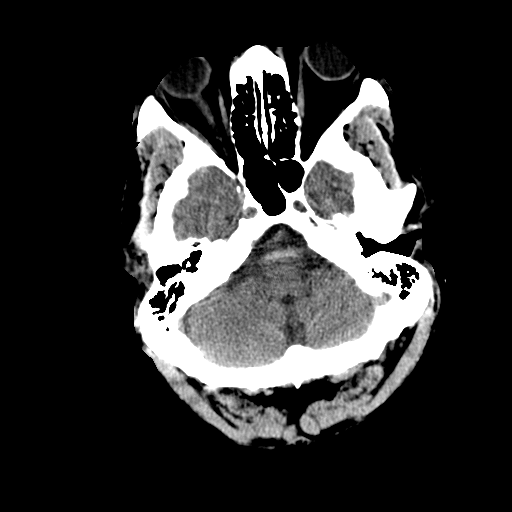
[im 8/30  brain]
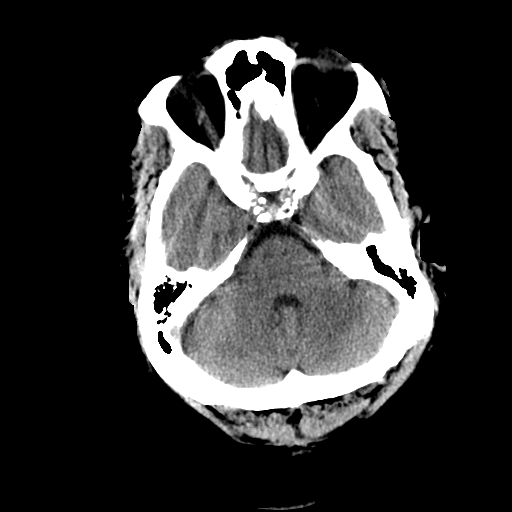
[im 9/30  brain]
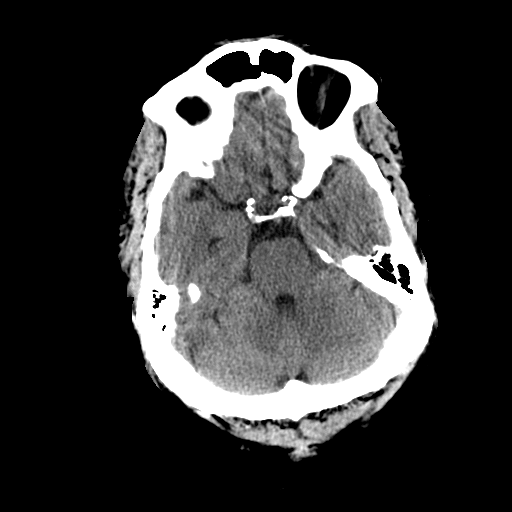
[im 9/30  bone]
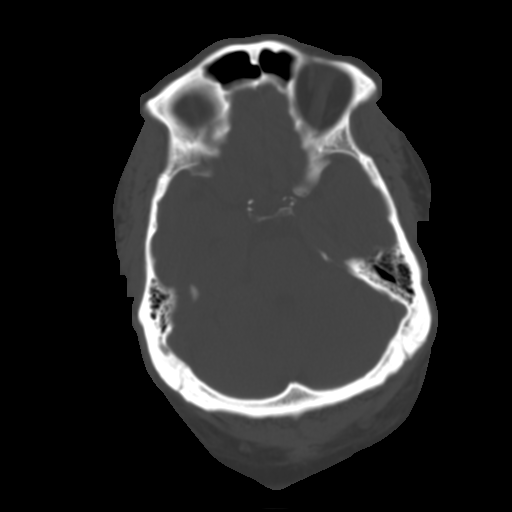
[im 11/30  brain]
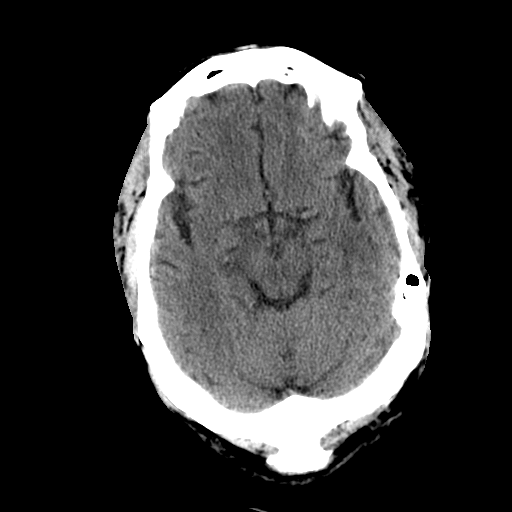
[im 13/30  brain]
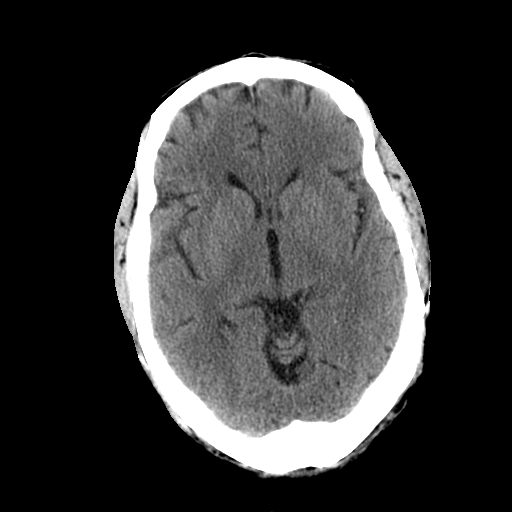
[im 15/30  brain]
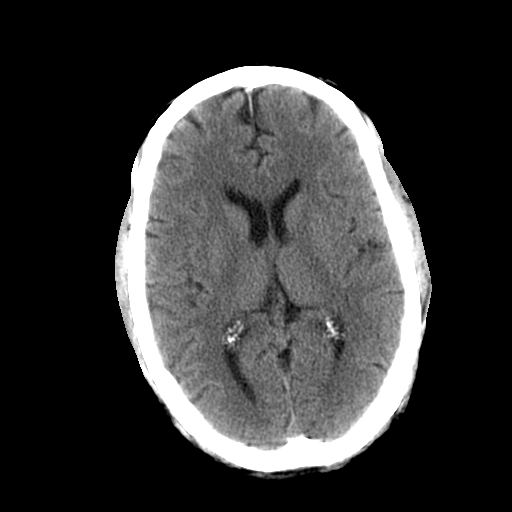
[im 16/30  brain]
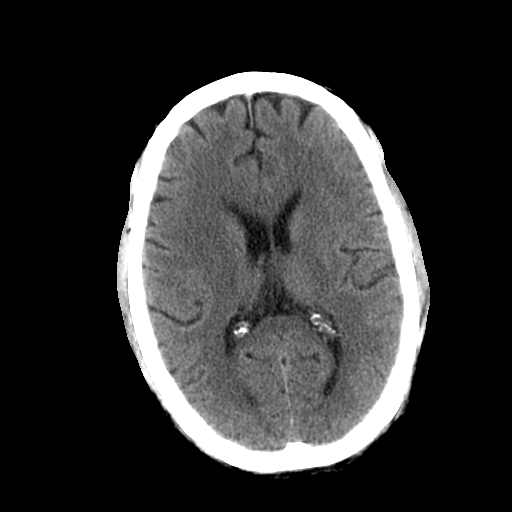
[im 16/30  bone]
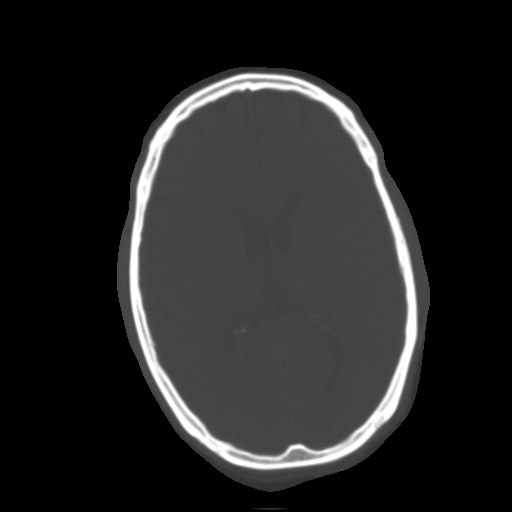
[im 18/30  brain]
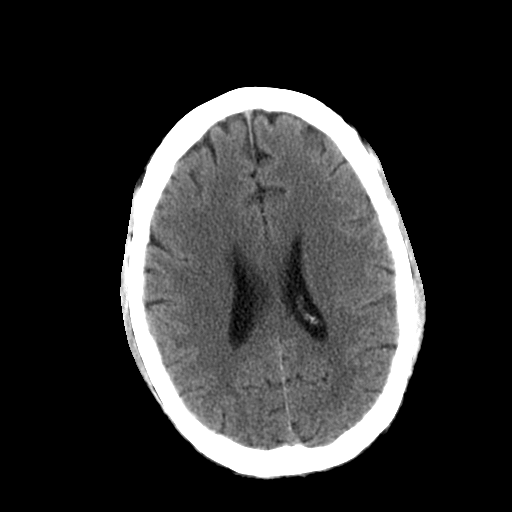
[im 20/30  brain]
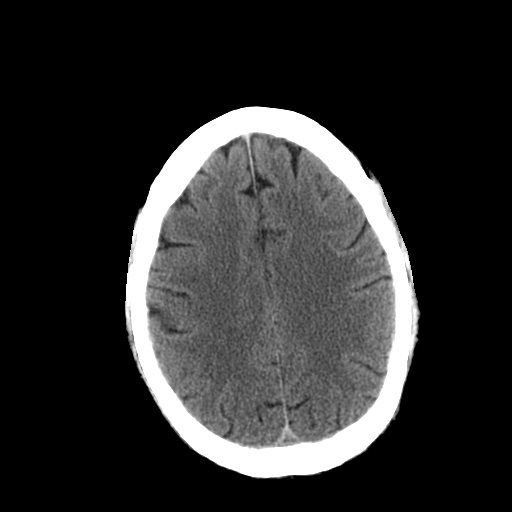
[im 22/30  brain]
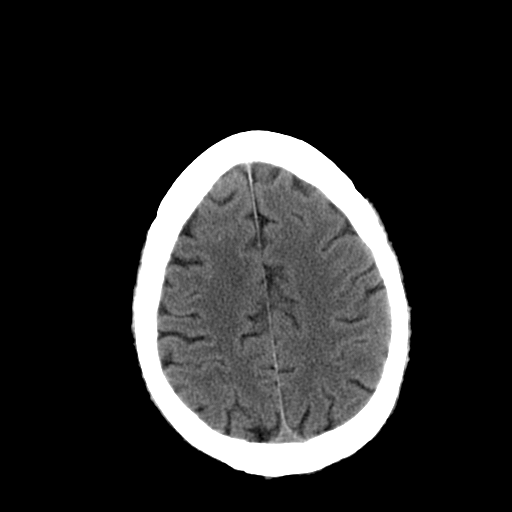
[im 23/30  brain]
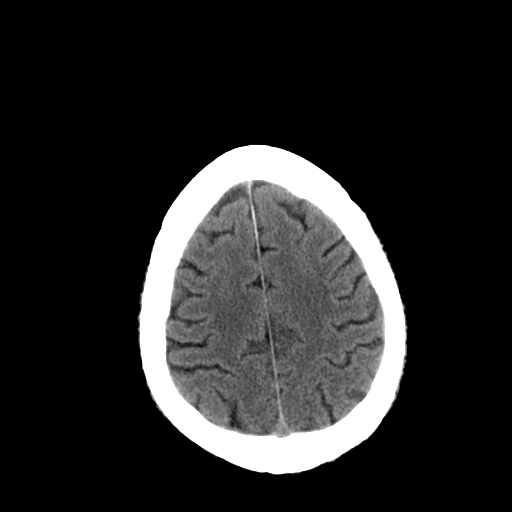
[im 23/30  bone]
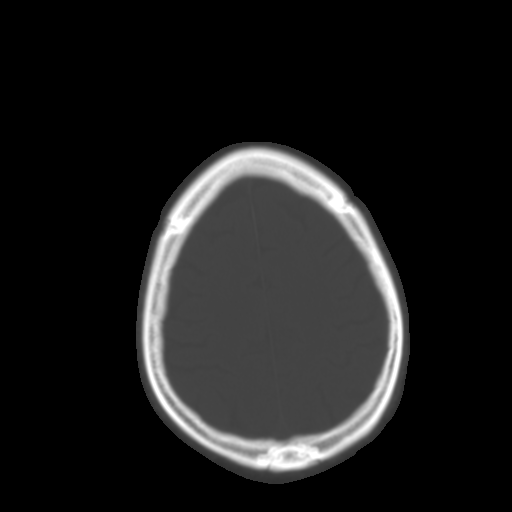
[im 25/30  brain]
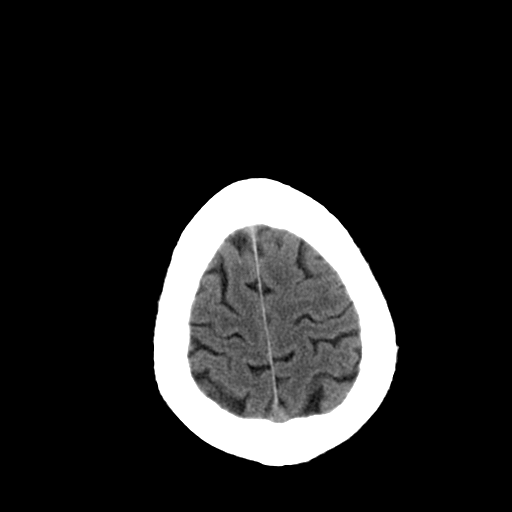
[im 27/30  brain]
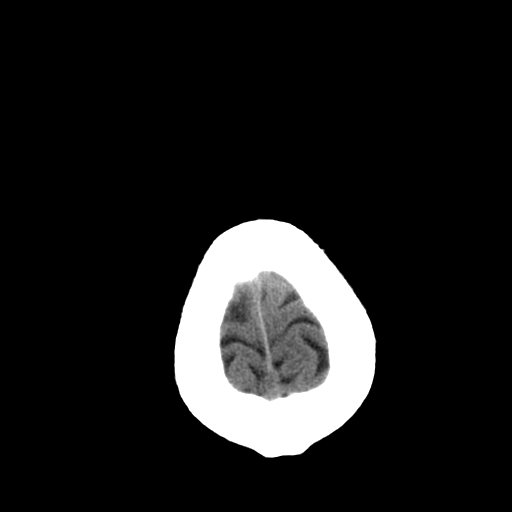
[im 29/30  brain]
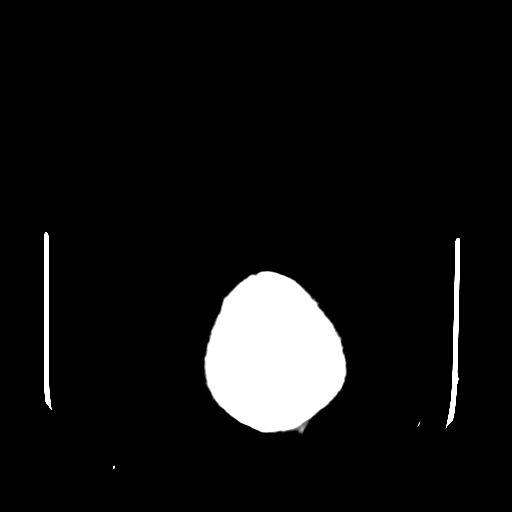

[16 of 30 positions shown; findings below may reference images not displayed]

FINDINGS: There is no evidence of intracranial hemorrhage, brain edema, acute infarct, mass lesion, or mass effect.  No other intra-axial abnormalities are seen, and the ventricles are within normal limits.  No abnormal extra-axial fluid collections or masses are identified.  No skull abnormalities are noted.
IMPRESSION: Negative non-contrast head CT.

## 2008-09-21 IMAGING — RF DG ESOPHAGUS
14 of 24 series · 14 of 24 positions shown · non-contrast
Comparison: none

CLINICAL DATA: Laminectomy of the cervical spine 3 weeks ago. Dysphagia since.
History esophageal dilatation 4 years ago in the gastroesophageal junction.

ESOPHAGRAM:
TECHNIQUE: Routine esophagram was performed under fluoroscopy.
Comparison upper GI of 05/02/2006.

[Series 1: run · 1 of 1 slices shown (1 of 14)]
[im 1/1]
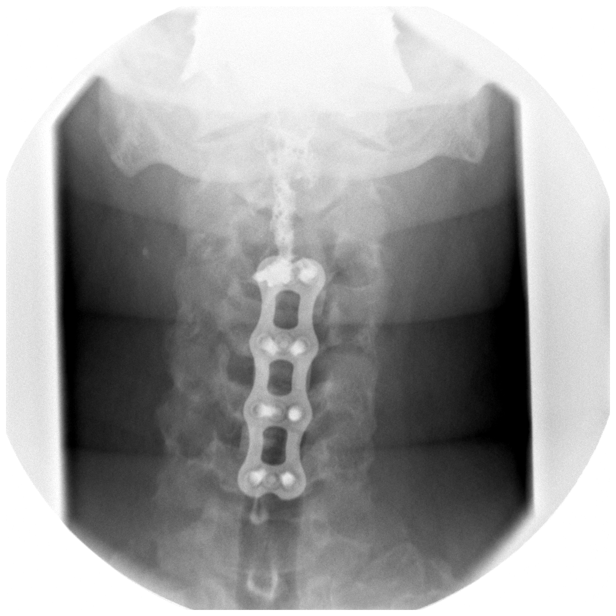

[Series 3: run · 1 of 1 slices shown (2 of 14)]
[im 1/1]
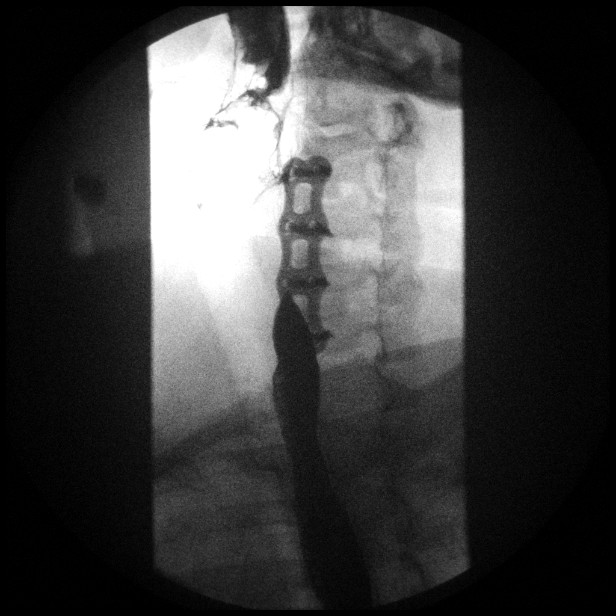

[Series 5: run · 1 of 1 slices shown (3 of 14)]
[im 1/1]
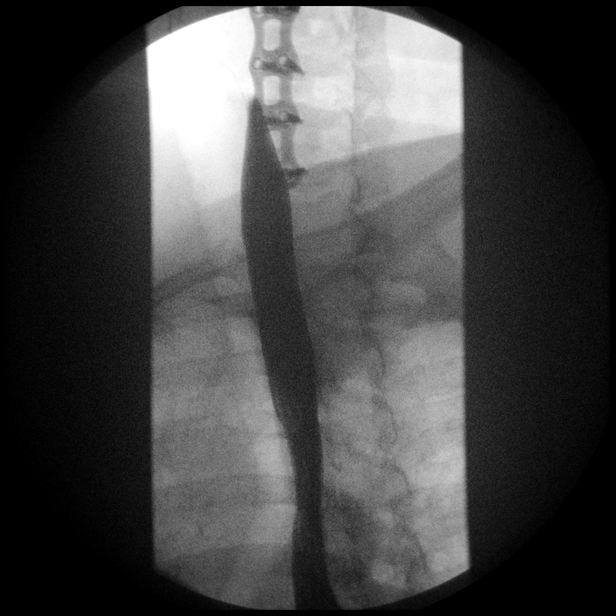

[Series 7: run · 1 of 1 slices shown (4 of 14)]
[im 1/1]
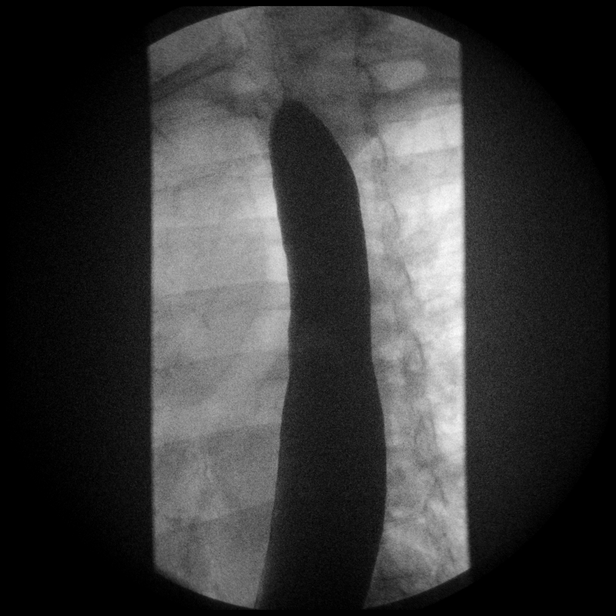

[Series 8: run · 1 of 1 slices shown (5 of 14)]
[im 1/1]
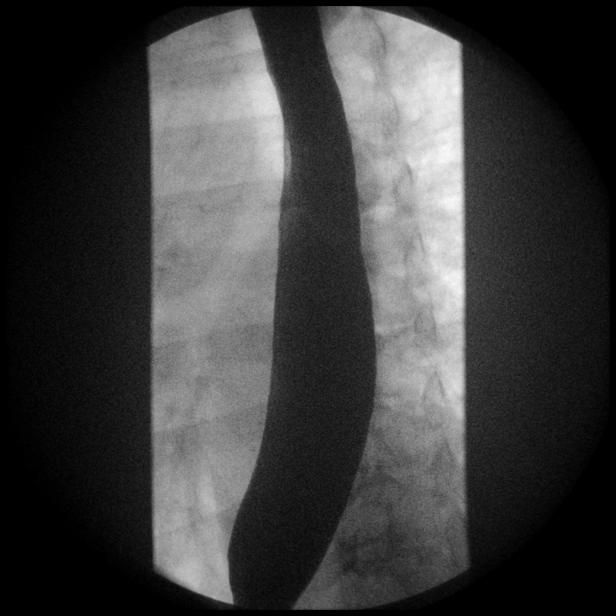

[Series 10: run · 1 of 1 slices shown (6 of 14)]
[im 1/1]
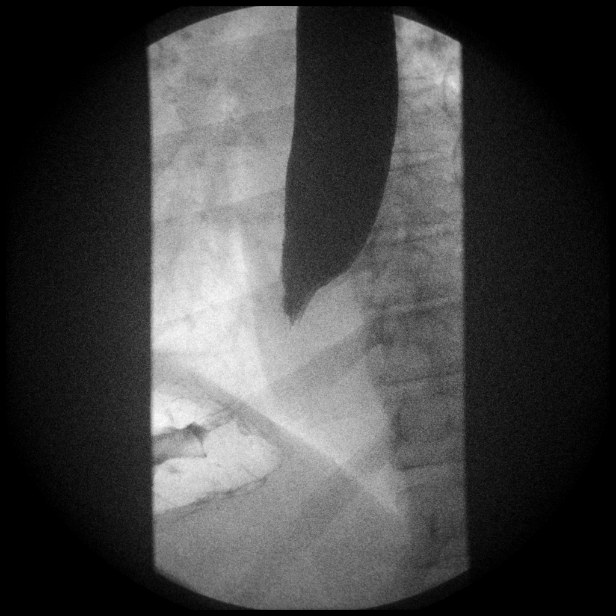

[Series 12: run · 1 of 1 slices shown (7 of 14)]
[im 1/1]
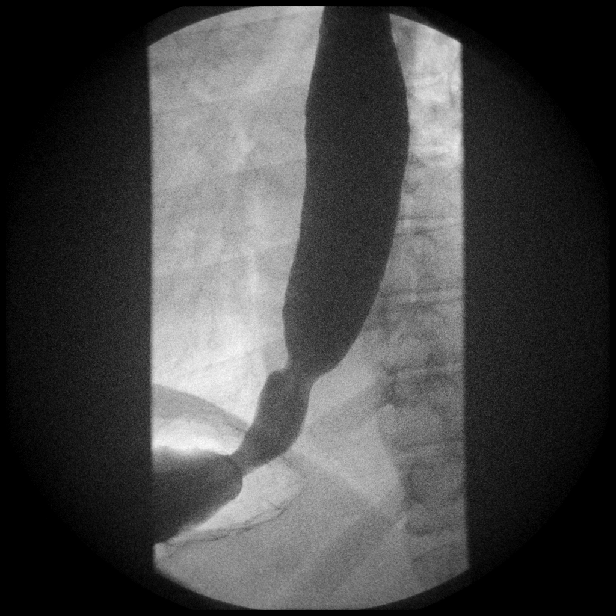

[Series 13: run · 1 of 1 slices shown (8 of 14)]
[im 1/1]
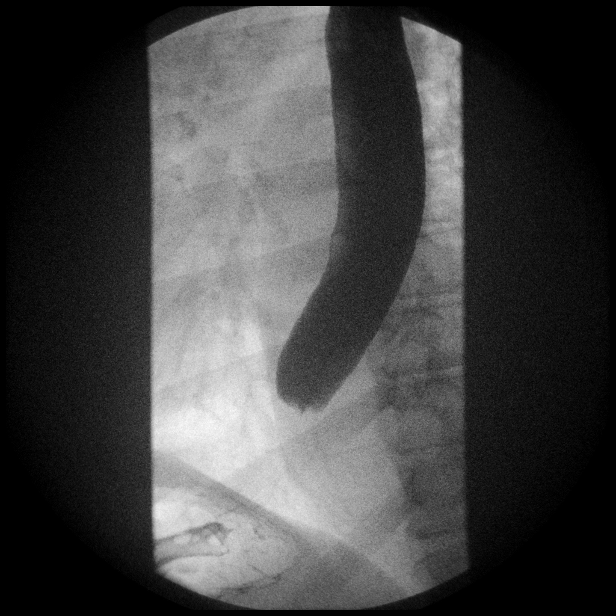

[Series 15: run · 1 of 1 slices shown (9 of 14)]
[im 1/1]
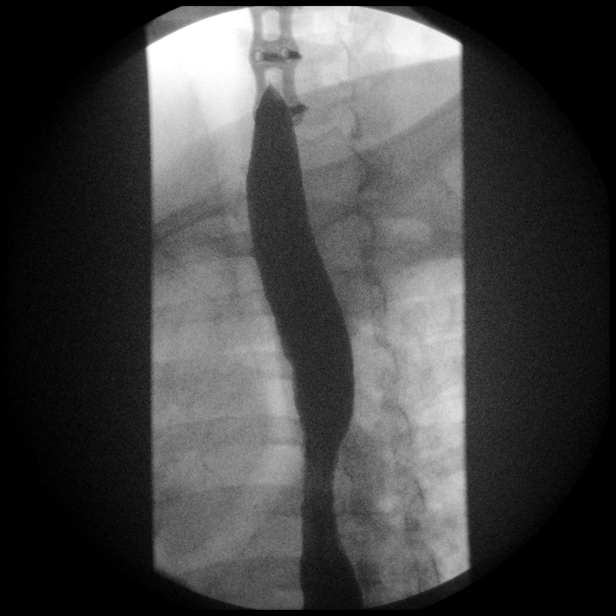

[Series 17: run · 1 of 1 slices shown (10 of 14)]
[im 1/1]
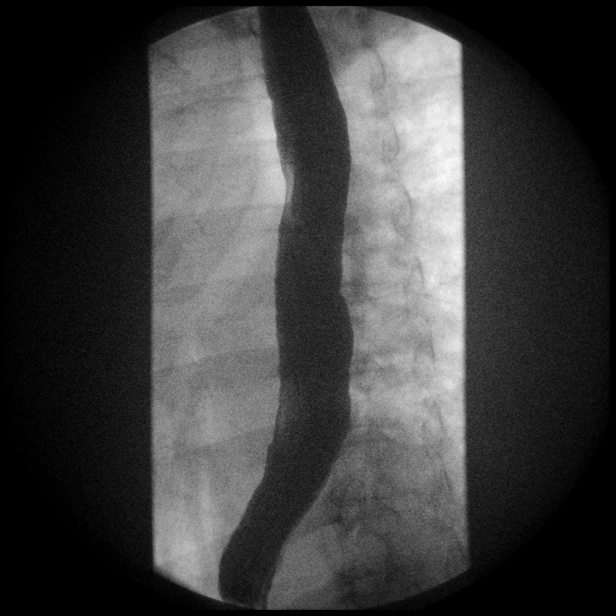

[Series 19: run · 1 of 1 slices shown (11 of 14)]
[im 1/1]
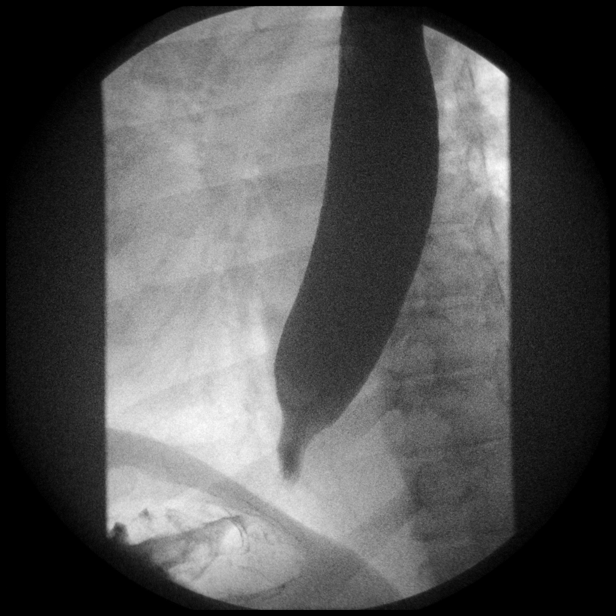

[Series 20: run · 1 of 1 slices shown (12 of 14)]
[im 1/1]
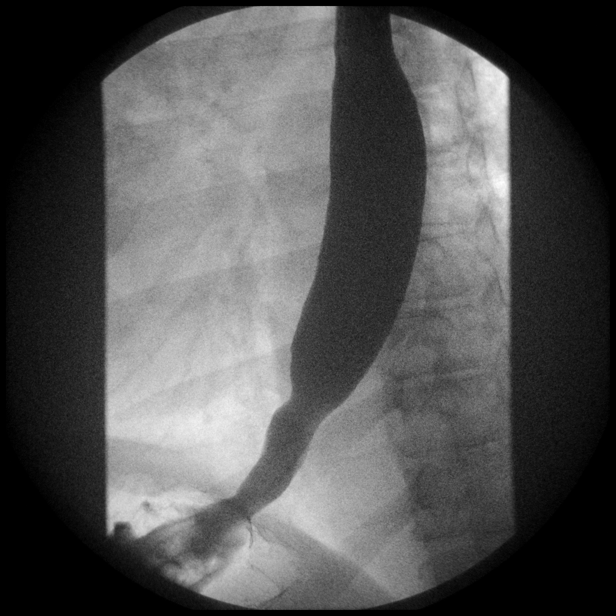

[Series 22: run · 1 of 1 slices shown (13 of 14)]
[im 1/1]
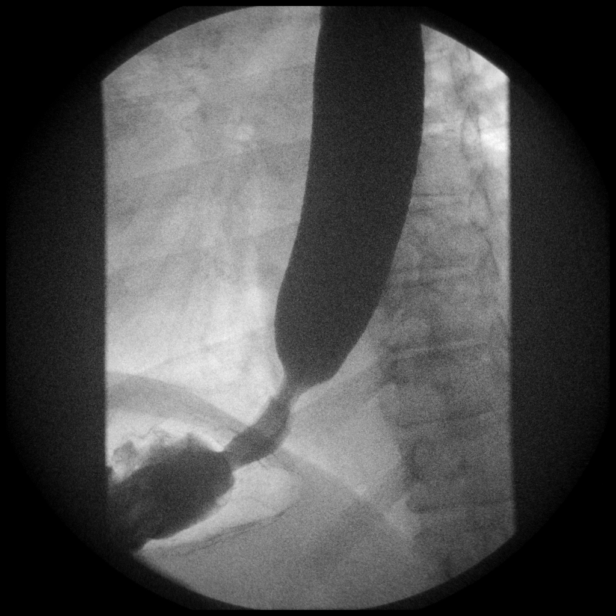

[Series 24: run · 1 of 1 slices shown (14 of 14)]
[im 1/1]
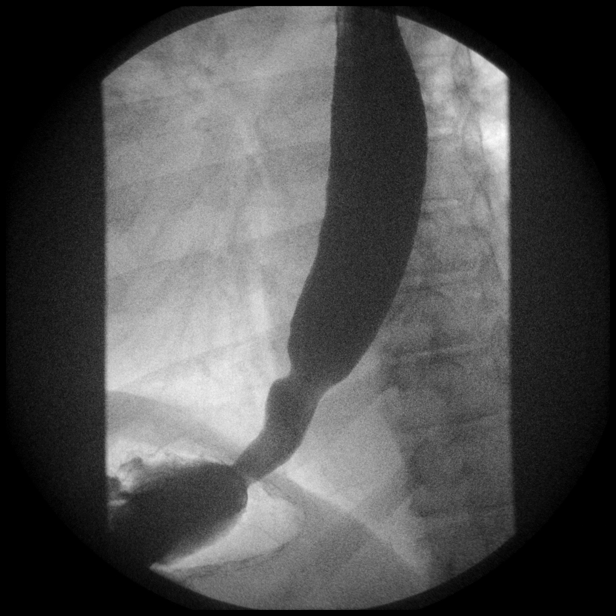

[14 of 24 positions shown; findings below may reference images not displayed]

FINDINGS: The hypopharyngeal images were not saved in their entirety due to
computer error. No hypopharyngeal abnormality was identified during fluoroscopic
imaging.

Evaluation of primary peristalsis demonstrates incomplete primary peristaltic
wave with contrast stasis in the lower thoracic.

Single contrast evaluation of the esophagus demonstrates normal-caliber upper
and mid thoracic esophagus. Small hiatal hernia. An area of incomplete
distensibility at the gastroesophageal junction is suspicious for a mild
stricture. Series 12 and 24.

A 13 mm barium tablet demonstrates mild delay of passage at this area of
incomplete distention.
IMPRESSION: 1. Mild esophageal dysmotility.
2. Small hiatal hernia with suspicion of mild stricture at the gastroesophageal
junction.
3. Mild delay of passage of 13 mm barium tablet at this level.

## 2008-09-28 IMAGING — US US SOFT TISSUE HEAD/NECK
1 series · 14 of 25 positions shown · non-contrast
Comparison: none

CLINICAL DATA: Thyromegaly. 
THYROID ULTRASOUND:
TECHNIQUE: Ultrasound examination of the thyroid gland and adjacent soft tissue structures was performed.

[Series 1: us soft tissue head/neck · 0.08mm/px · 14 of 47 slices shown]
[im 1/47]
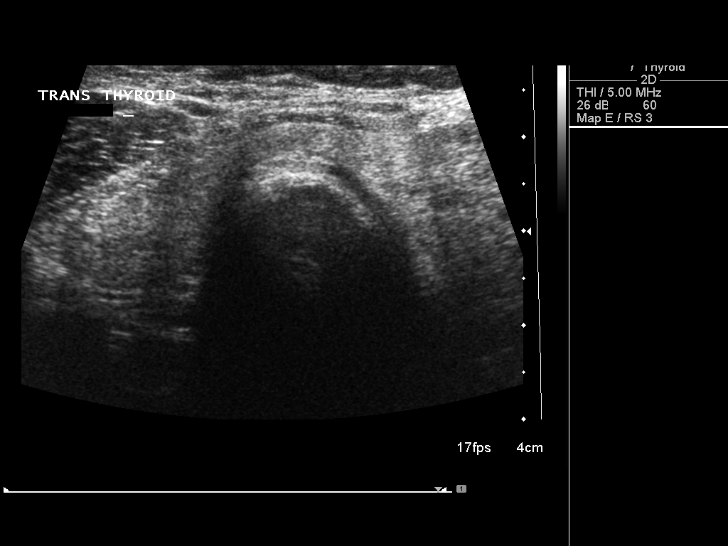
[im 4/47]
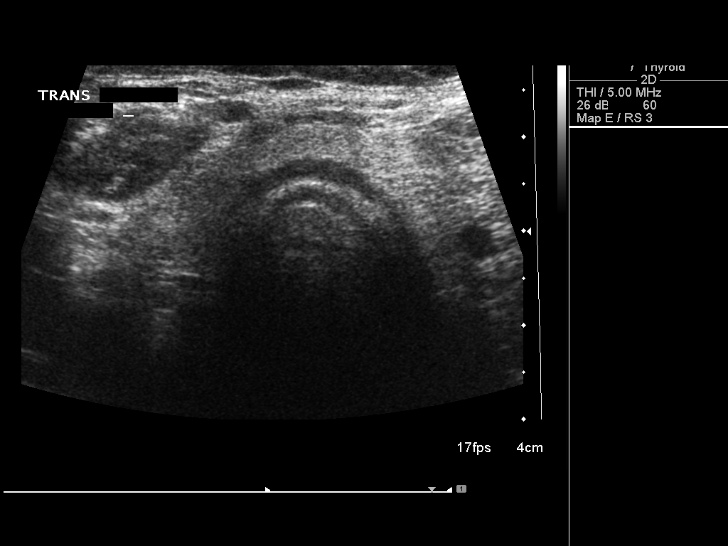
[im 8/47]
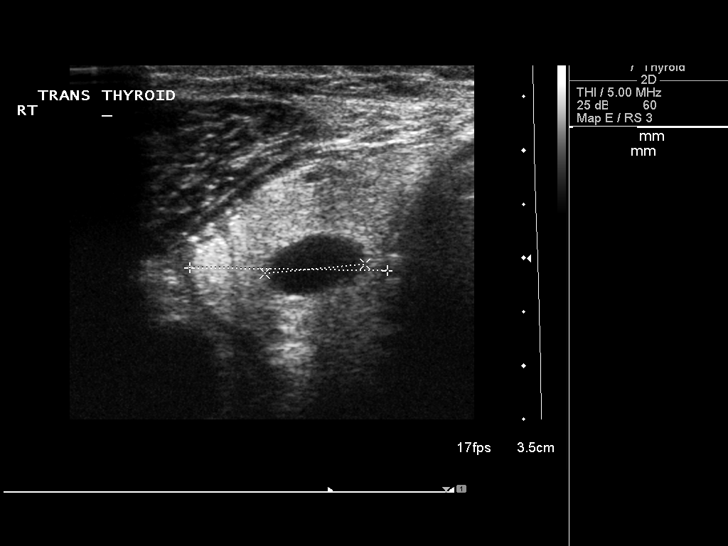
[im 12/47]
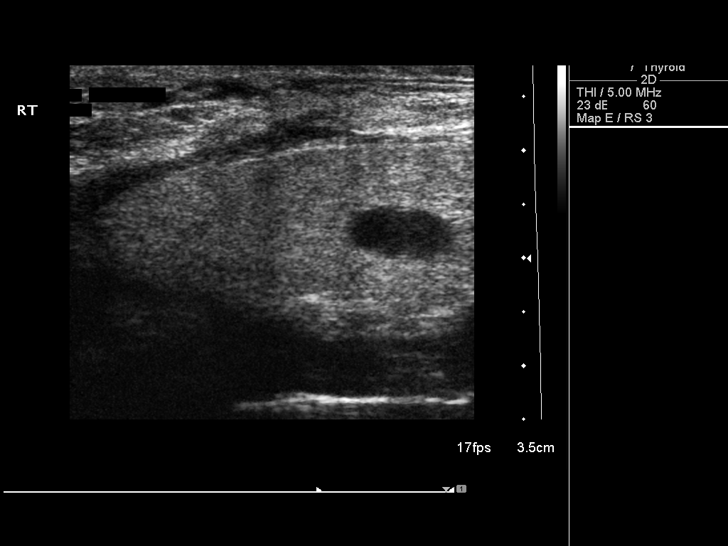
[im 16/47]
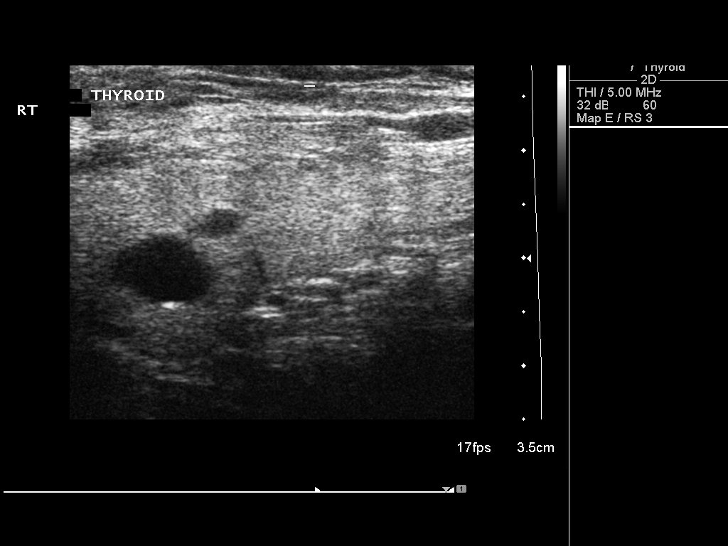
[im 18/47]
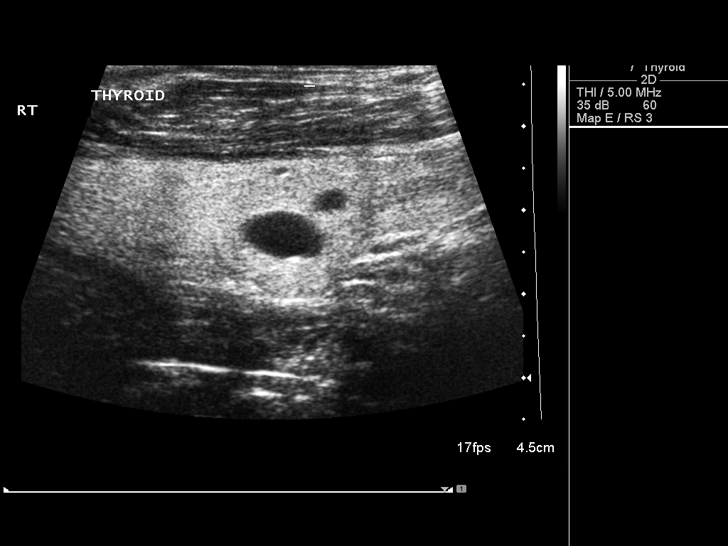
[im 22/47]
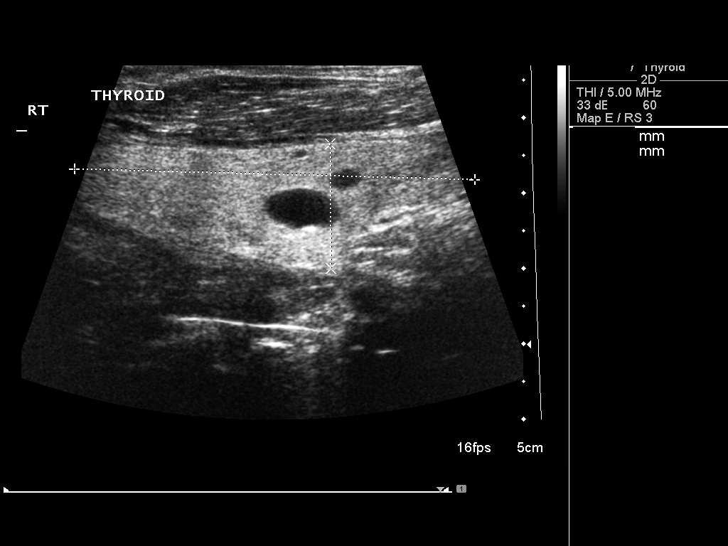
[im 25/47]
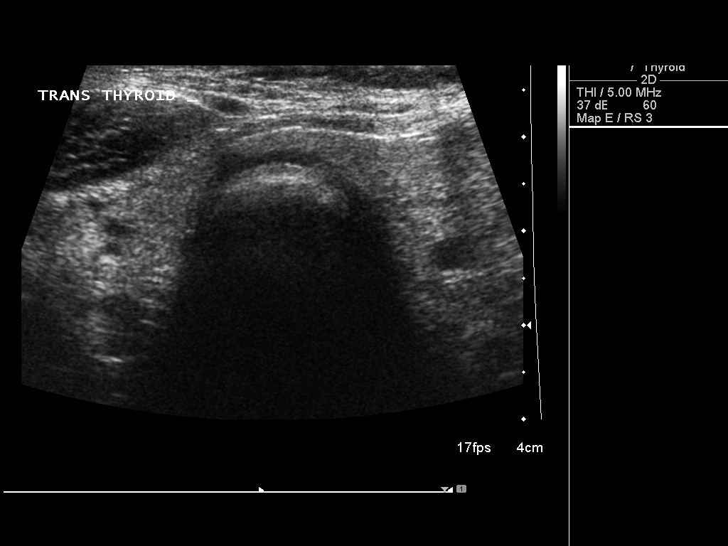
[im 29/47]
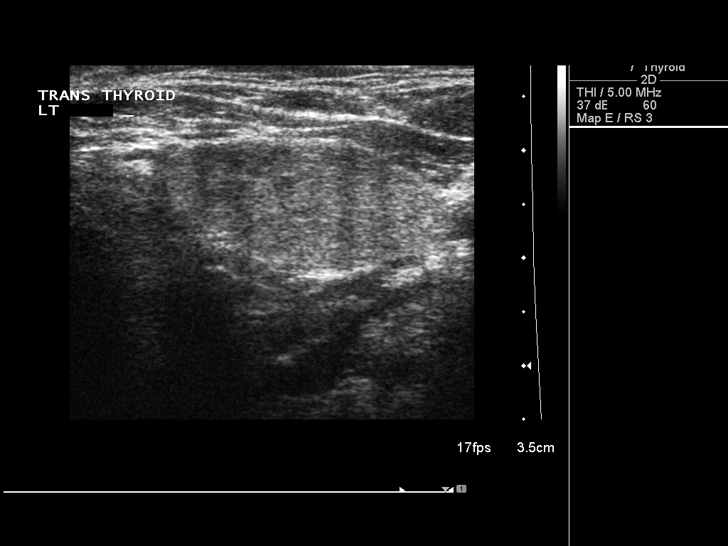
[im 31/47]
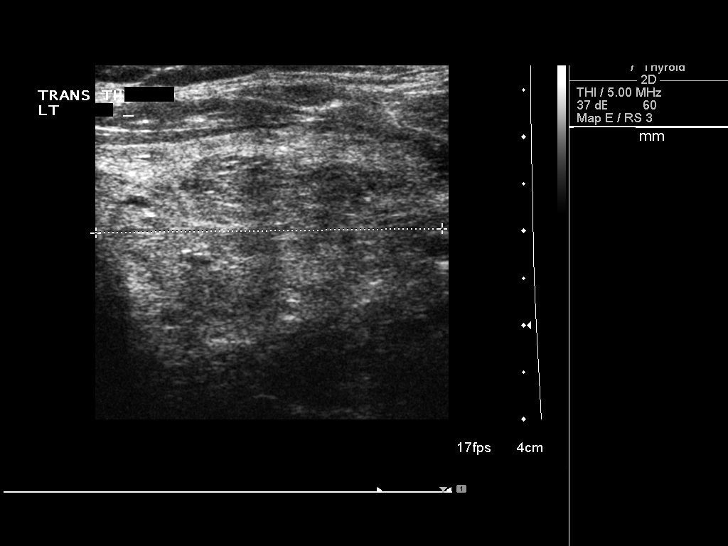
[im 35/47]
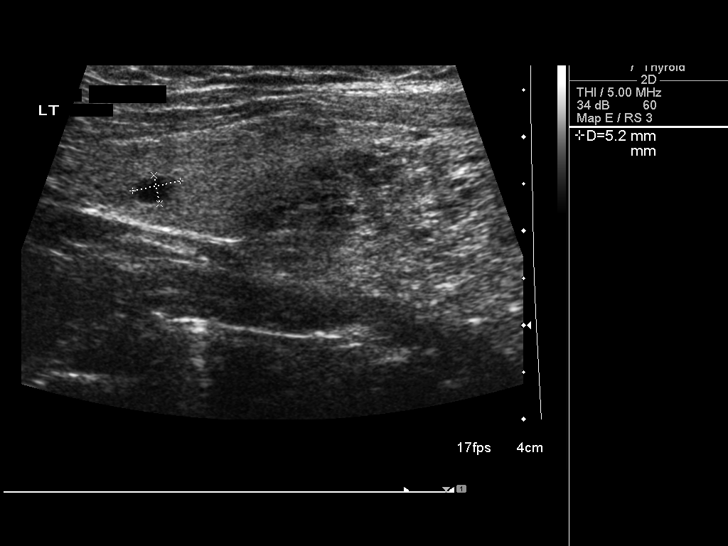
[im 39/47]
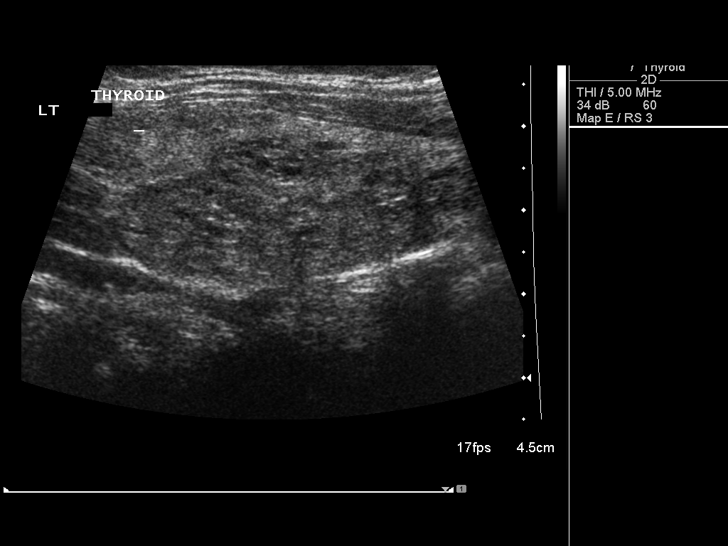
[im 43/47]
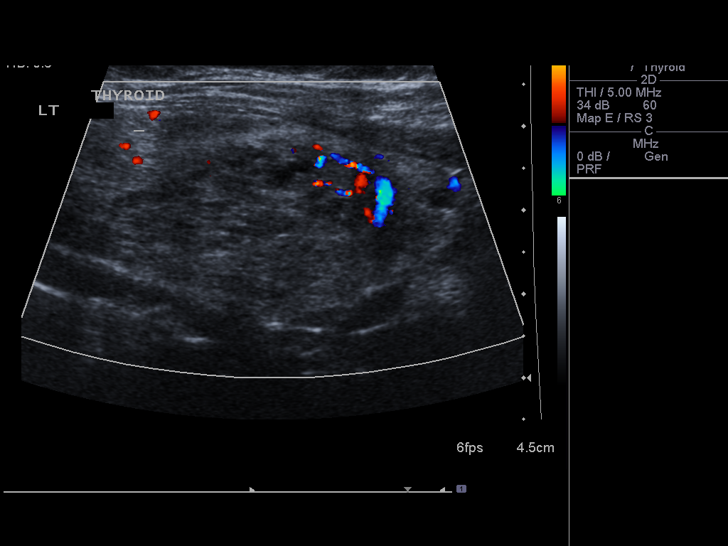
[im 47/47]
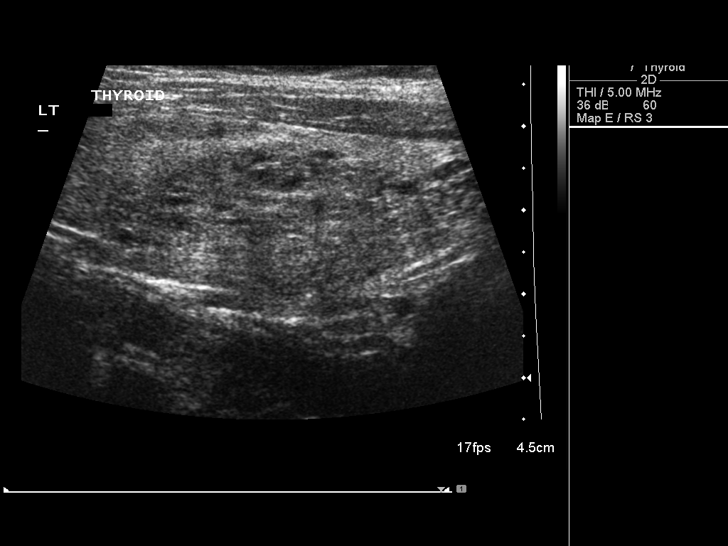

[14 of 25 positions shown; findings below may reference images not displayed]

FINDINGS: Right lobe of the thyroid measures 5.3 x 1.7 x 2.0 cm and left lobe, 5.8 x 2.2 x 3.7 cm.  Isthmus measures 4 mm.  Thyroid echotexture is heterogeneous.  There is a dominant hypoechoic nodule in the mid and lower poles of the left thyroid, measuring 4.5 x 2.0 x 3.7 cm.  There are other cystic and hypoechoic nodules bilaterally.  The largest cystic lesion measures 1.0 x 0.6 x 0.9 cm in the midright thyroid.  Other hypoechoic nodules are 5 mm or less in size.
IMPRESSION: Dominant solid nodule in the left mid/lower pole.  iopsy is recommended, as neoplasm cannot be excluded.

## 2008-10-21 ENCOUNTER — Encounter: Payer: Self-pay | Admitting: Endocrinology

## 2008-10-29 ENCOUNTER — Ambulatory Visit: Payer: Self-pay | Admitting: Pulmonary Disease

## 2008-10-29 DIAGNOSIS — R0602 Shortness of breath: Secondary | ICD-10-CM

## 2008-11-12 ENCOUNTER — Ambulatory Visit: Payer: Self-pay | Admitting: Endocrinology

## 2008-11-22 ENCOUNTER — Telehealth: Payer: Self-pay | Admitting: Endocrinology

## 2008-12-03 ENCOUNTER — Ambulatory Visit: Payer: Self-pay | Admitting: Endocrinology

## 2009-01-12 ENCOUNTER — Ambulatory Visit: Payer: Self-pay | Admitting: Pulmonary Disease

## 2009-01-20 ENCOUNTER — Telehealth: Payer: Self-pay | Admitting: Endocrinology

## 2009-01-21 ENCOUNTER — Encounter: Payer: Self-pay | Admitting: Endocrinology

## 2009-01-27 ENCOUNTER — Encounter: Payer: Self-pay | Admitting: Endocrinology

## 2009-02-01 ENCOUNTER — Telehealth: Payer: Self-pay | Admitting: Pulmonary Disease

## 2009-02-06 DIAGNOSIS — I2510 Atherosclerotic heart disease of native coronary artery without angina pectoris: Secondary | ICD-10-CM | POA: Insufficient documentation

## 2009-02-06 DIAGNOSIS — I5032 Chronic diastolic (congestive) heart failure: Secondary | ICD-10-CM | POA: Insufficient documentation

## 2009-02-06 HISTORY — DX: Atherosclerotic heart disease of native coronary artery without angina pectoris: I25.10

## 2009-02-07 ENCOUNTER — Ambulatory Visit: Payer: Self-pay | Admitting: Internal Medicine

## 2009-02-07 ENCOUNTER — Encounter (INDEPENDENT_AMBULATORY_CARE_PROVIDER_SITE_OTHER): Payer: Self-pay | Admitting: *Deleted

## 2009-02-11 ENCOUNTER — Inpatient Hospital Stay (HOSPITAL_BASED_OUTPATIENT_CLINIC_OR_DEPARTMENT_OTHER): Admission: RE | Admit: 2009-02-11 | Discharge: 2009-02-11 | Payer: Self-pay | Admitting: Cardiovascular Disease

## 2009-02-11 ENCOUNTER — Ambulatory Visit: Payer: Self-pay | Admitting: Internal Medicine

## 2009-02-14 ENCOUNTER — Ambulatory Visit: Payer: Self-pay | Admitting: Cardiovascular Disease

## 2009-02-14 ENCOUNTER — Encounter: Admission: RE | Admit: 2009-02-14 | Discharge: 2009-03-02 | Payer: Self-pay | Admitting: Neurology

## 2009-02-14 ENCOUNTER — Encounter: Payer: Self-pay | Admitting: Internal Medicine

## 2009-02-15 ENCOUNTER — Telehealth: Payer: Self-pay | Admitting: Internal Medicine

## 2009-02-16 ENCOUNTER — Encounter: Admission: RE | Admit: 2009-02-16 | Discharge: 2009-02-16 | Payer: Self-pay | Admitting: Neurosurgery

## 2009-02-24 ENCOUNTER — Encounter: Payer: Self-pay | Admitting: Endocrinology

## 2009-03-04 ENCOUNTER — Ambulatory Visit: Payer: Self-pay | Admitting: Endocrinology

## 2009-03-04 ENCOUNTER — Ambulatory Visit: Payer: Self-pay | Admitting: Pulmonary Disease

## 2009-03-07 ENCOUNTER — Encounter: Payer: Self-pay | Admitting: Internal Medicine

## 2009-03-07 ENCOUNTER — Encounter: Payer: Self-pay | Admitting: Endocrinology

## 2009-03-20 ENCOUNTER — Encounter: Payer: Self-pay | Admitting: Endocrinology

## 2009-03-23 ENCOUNTER — Encounter: Payer: Self-pay | Admitting: Endocrinology

## 2009-03-30 ENCOUNTER — Encounter: Payer: Self-pay | Admitting: Endocrinology

## 2009-04-08 ENCOUNTER — Ambulatory Visit (HOSPITAL_COMMUNITY): Admission: RE | Admit: 2009-04-08 | Discharge: 2009-04-08 | Payer: Self-pay | Admitting: Gastroenterology

## 2009-04-08 ENCOUNTER — Encounter (INDEPENDENT_AMBULATORY_CARE_PROVIDER_SITE_OTHER): Payer: Self-pay | Admitting: Gastroenterology

## 2009-04-20 ENCOUNTER — Encounter: Payer: Self-pay | Admitting: Pulmonary Disease

## 2009-04-28 ENCOUNTER — Encounter: Payer: Self-pay | Admitting: Endocrinology

## 2009-05-10 IMAGING — CR DG CHEST 2V
2 series · 2 of 2 positions shown · non-contrast
Comparison: 11/28/2007

CLINICAL DATA: Dyspnea

CHEST - 2 VIEW

[w chest pa]
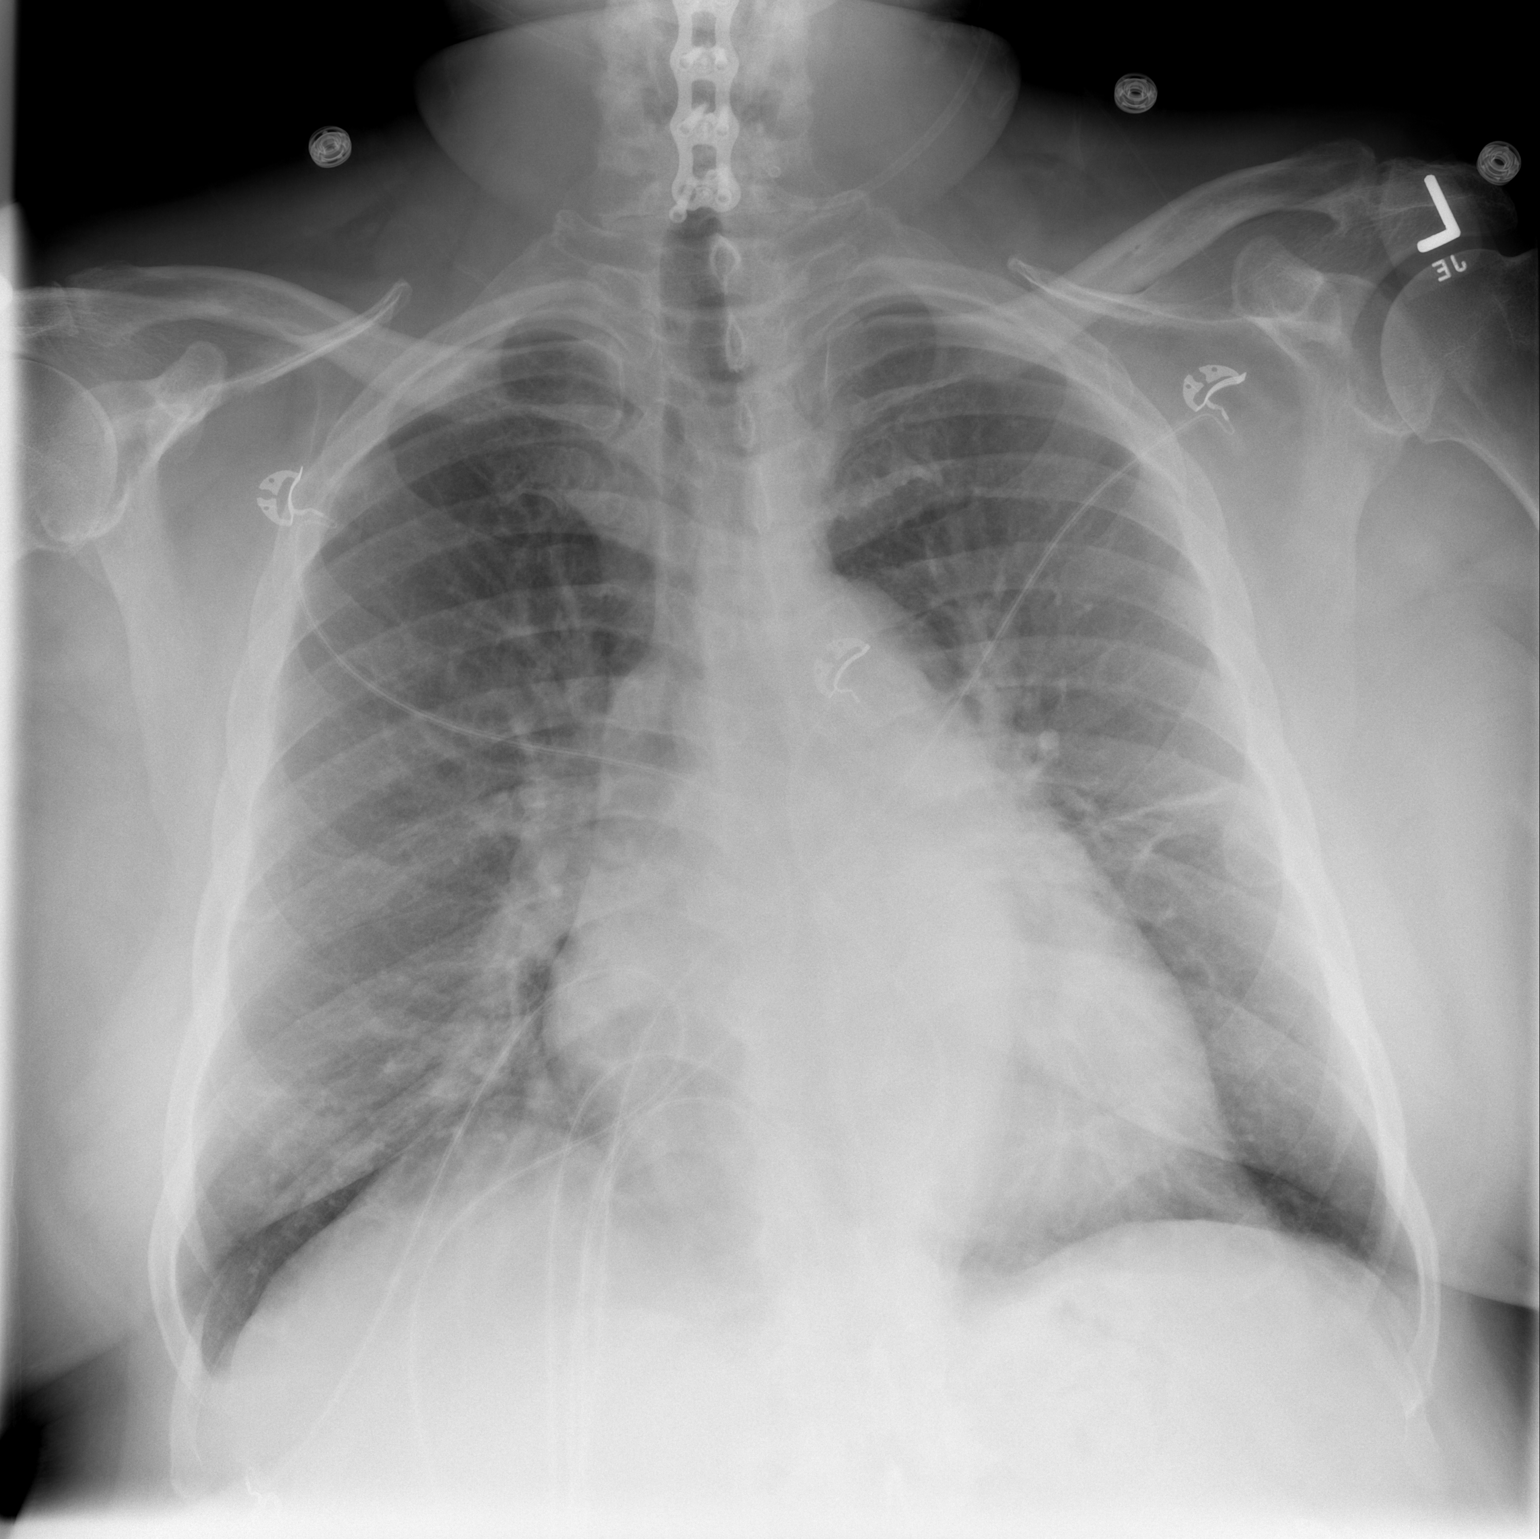

[w chest lat *]
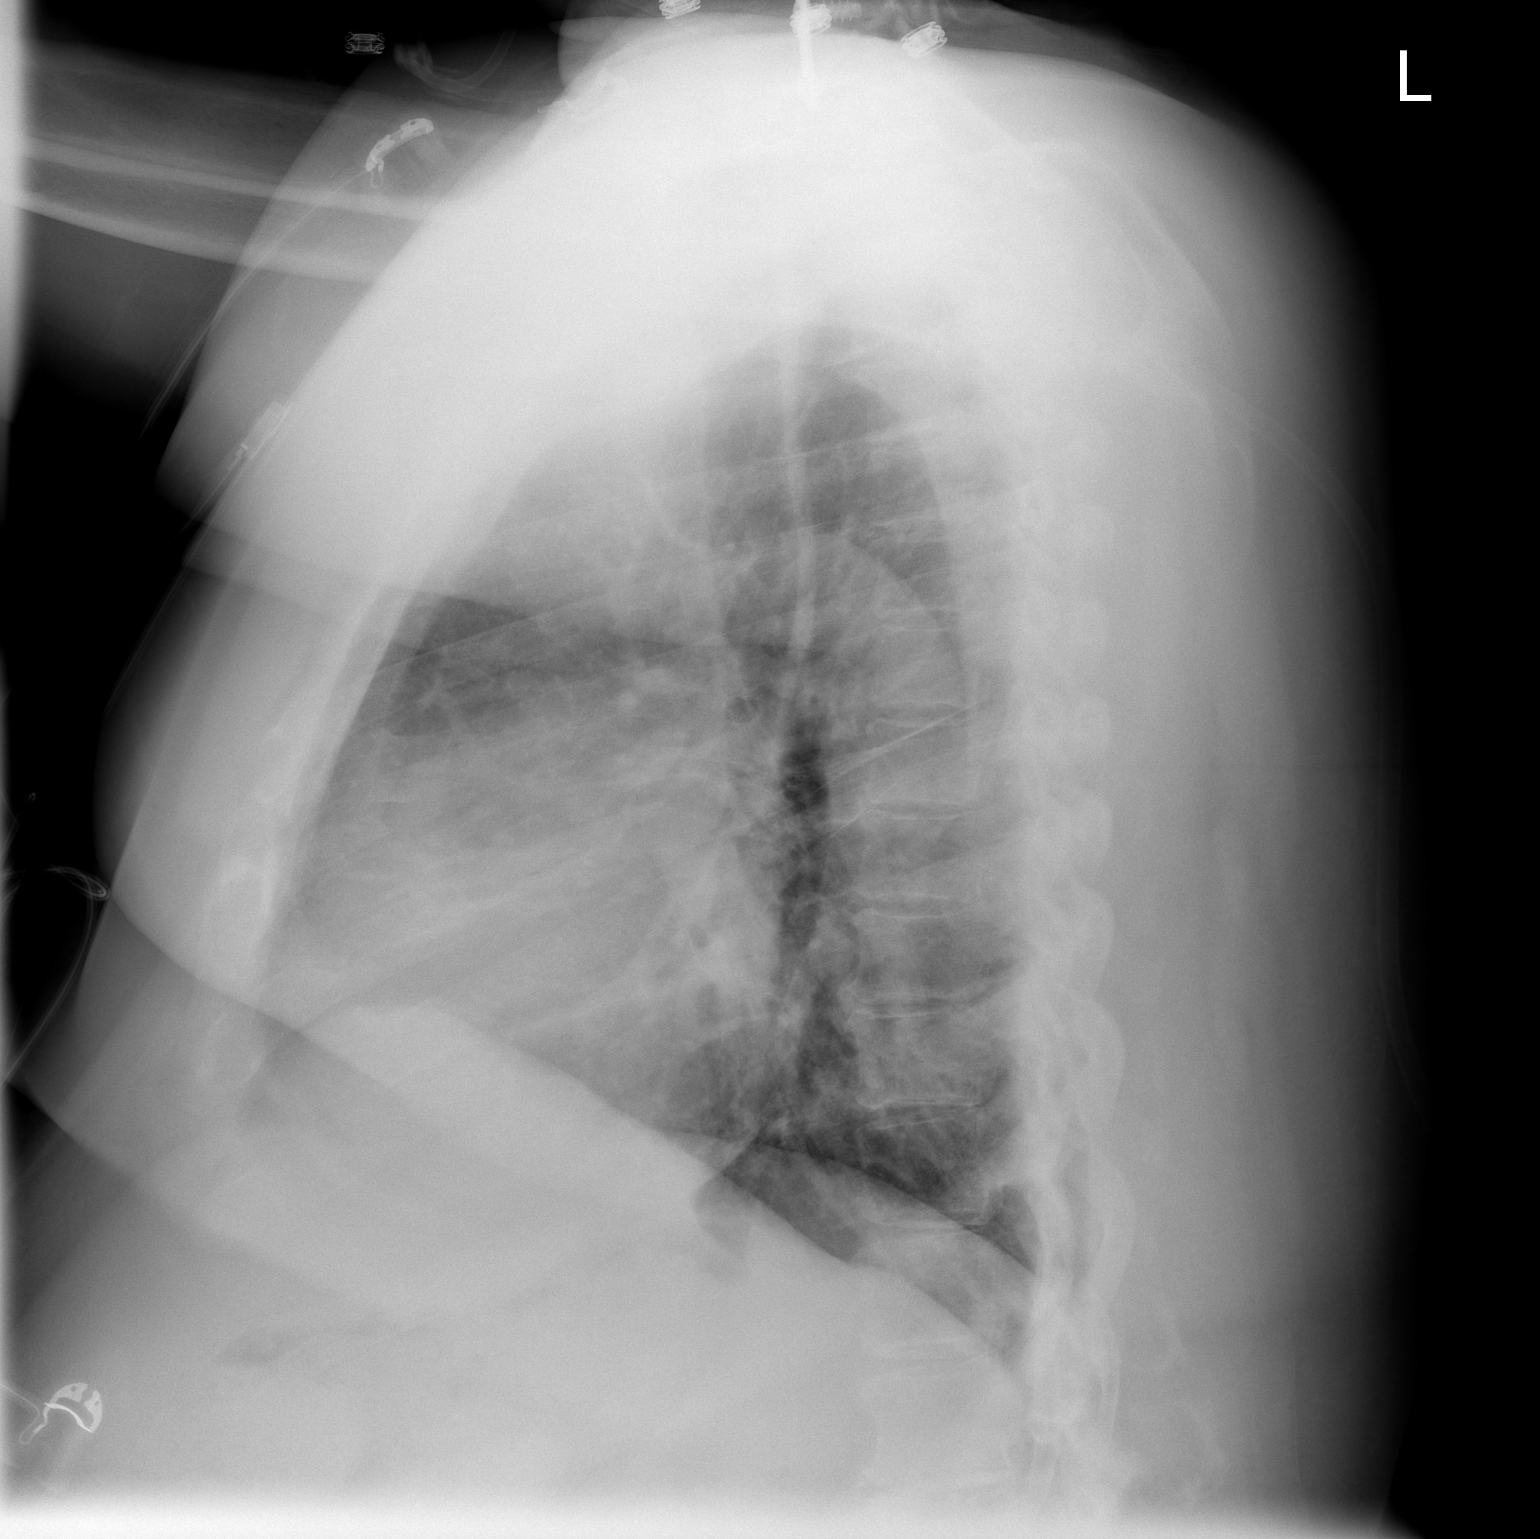

[2 of 2 positions shown; findings below may reference images not displayed]

FINDINGS: Cardiac enlargement.  Vascular congestion without frank
edema.  Negative for infiltrates or effusions.
IMPRESSION: Cardiac enlargement with vascular congestion.

## 2009-05-12 IMAGING — US US RENAL
1 series · 14 of 25 positions shown · non-contrast
Comparison: 04/03/2007

CLINICAL DATA: Renal failure.  Diabetes type 2.  Hypertension.

RENAL/URINARY TRACT ULTRASOUND
TECHNIQUE: Complete ultrasound examination of the urinary tract
was performed including evaluation of the kidneys, renal collecting
systems, and urinary bladder.

[Series 1: unknown · 0.32mm/px · 14 of 26 slices shown]
[im 1/26]
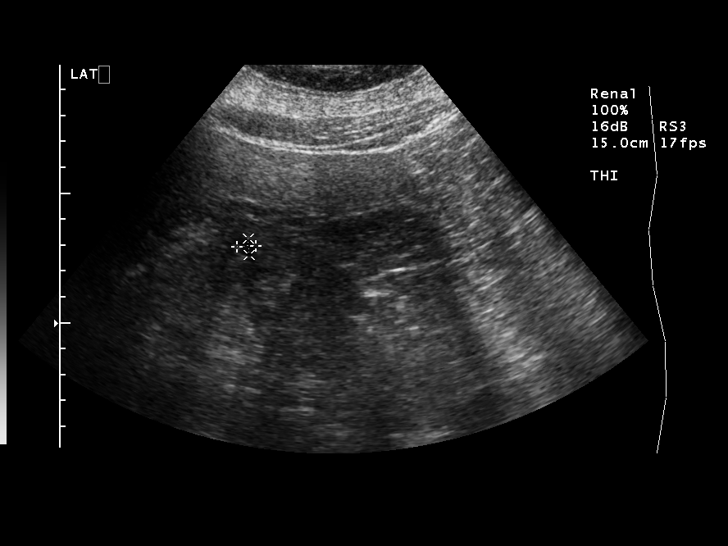
[im 3/26]
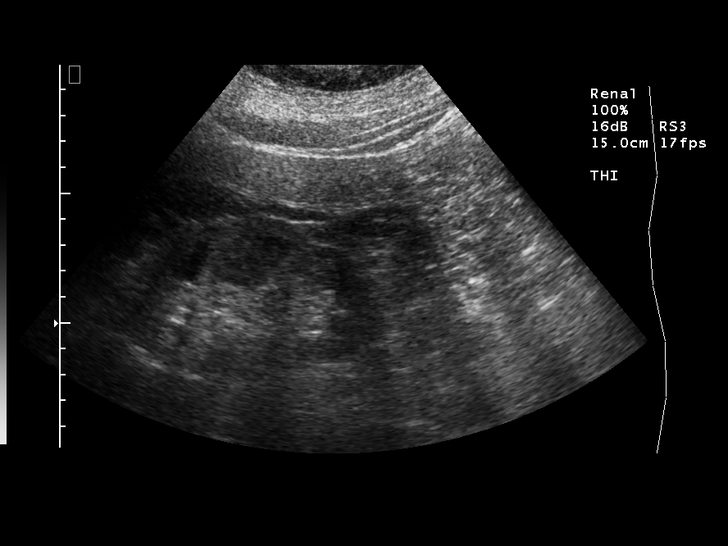
[im 5/26]
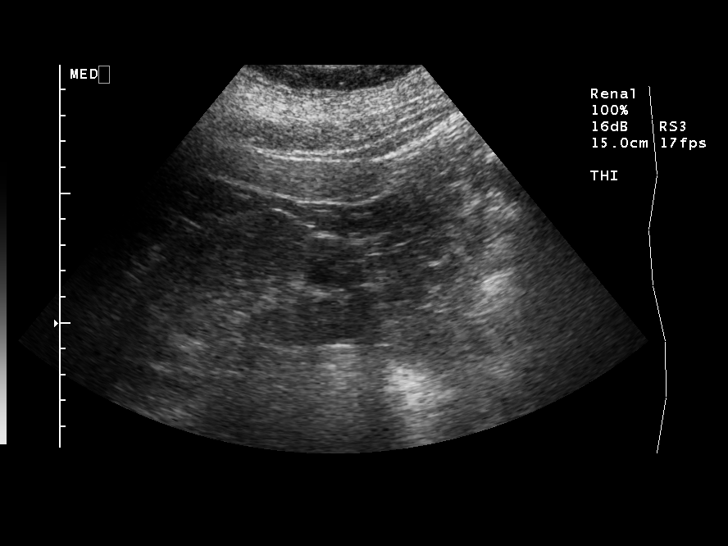
[im 7/26]
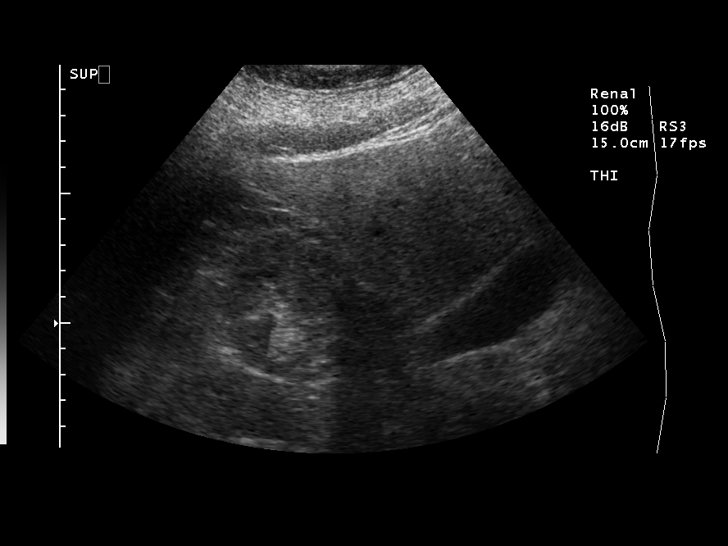
[im 9/26]
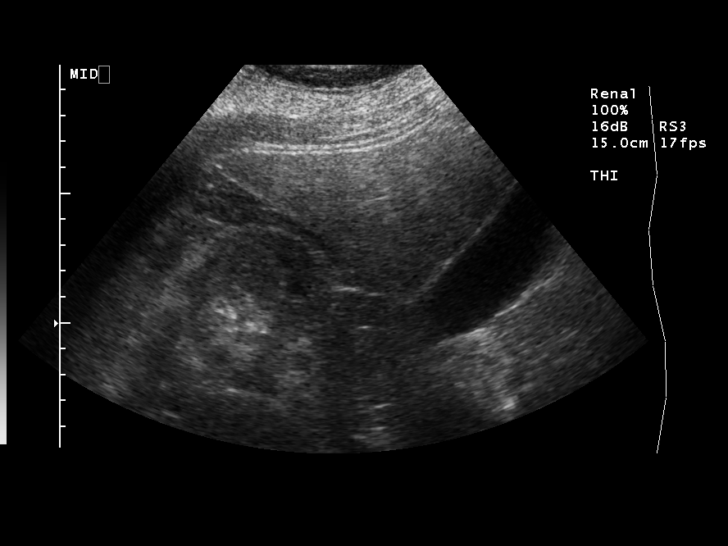
[im 10/26]
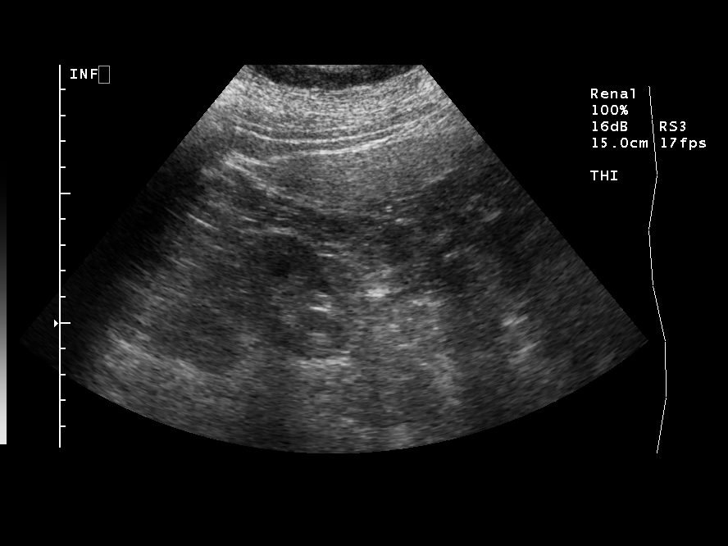
[im 12/26]
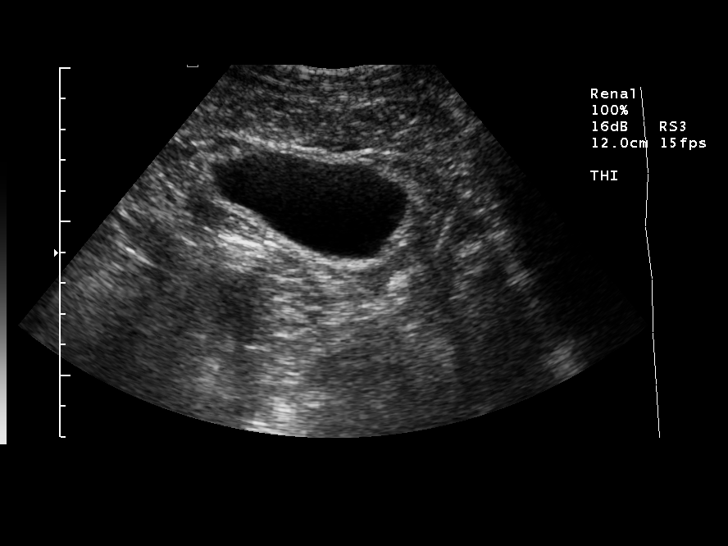
[im 14/26]
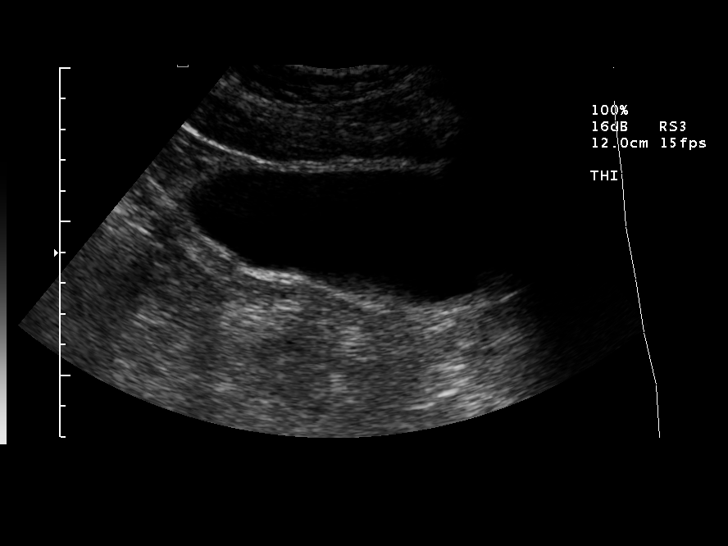
[im 16/26]
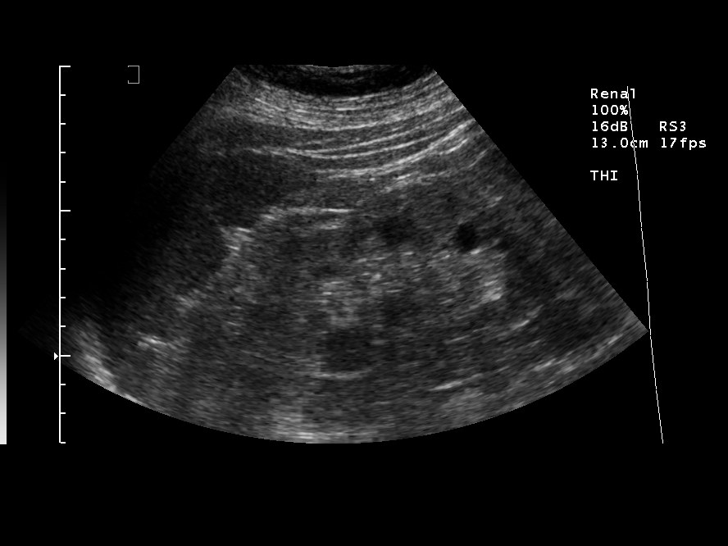
[im 17/26]
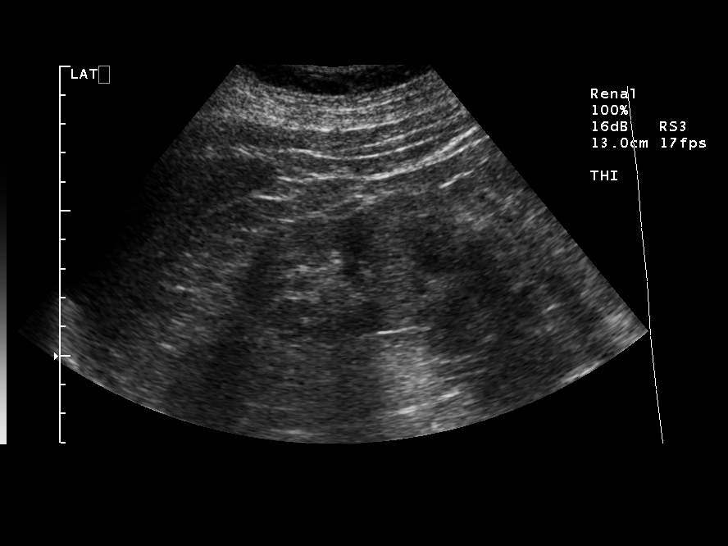
[im 19/26]
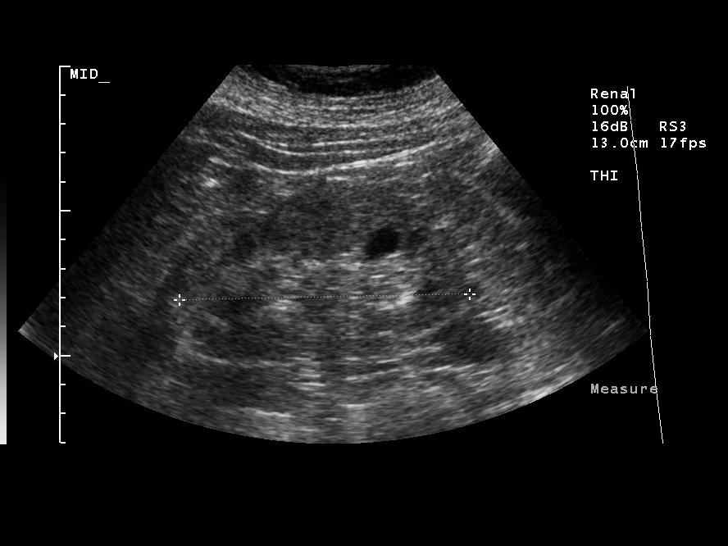
[im 21/26]
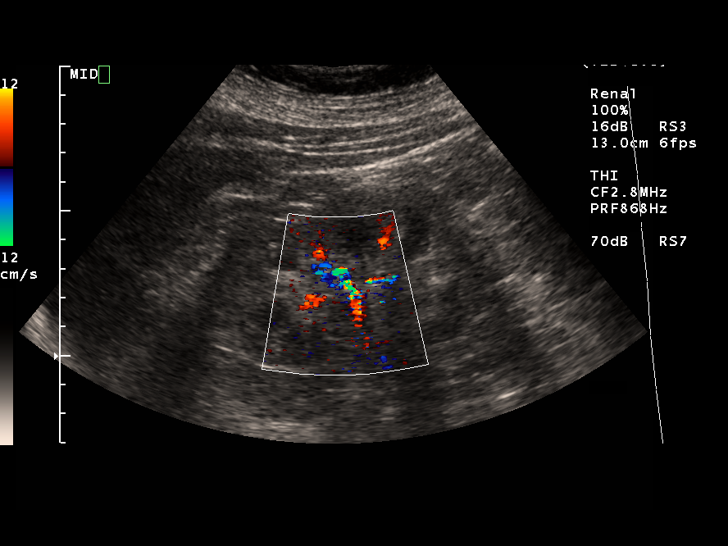
[im 23/26]
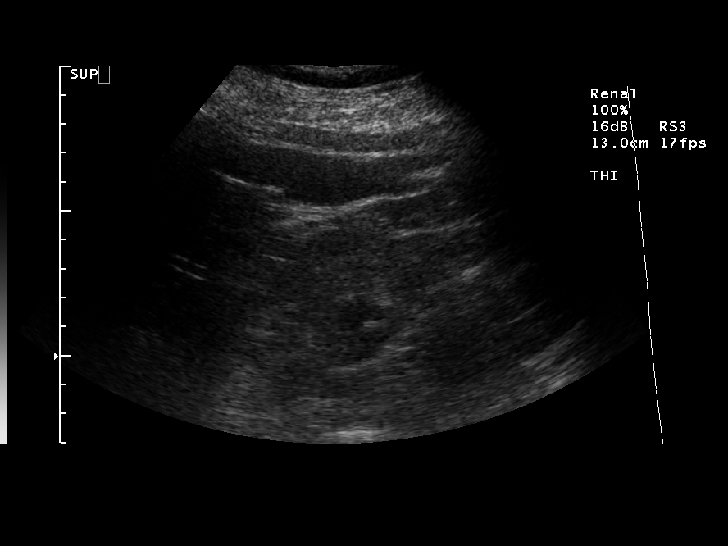
[im 26/26]
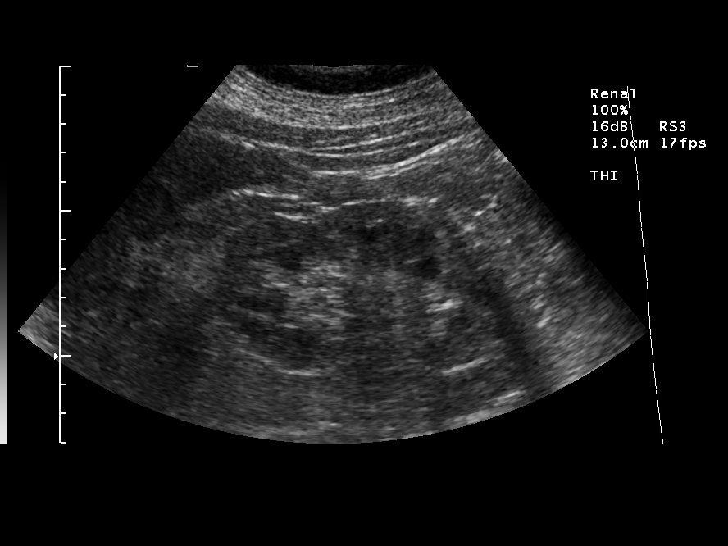

[14 of 25 positions shown; findings below may reference images not displayed]

FINDINGS: The right and left kidneys measure 10.2 cm and 10.0 cm in
length, respectively.  This compares to 11.6 cm and 10.7 cm on the
previous exam.  5 x 6 x 7 mm cyst in the right kidney is again
noted.  Diffuse increase in renal cortical echogenicity.  No
hydronephrosis.  Normal bladder.
IMPRESSION: Findings are compatible with a nonspecific renal medical disease.
No hydronephrosis.

## 2009-05-13 IMAGING — NM NM PULM PERFUSION & VENT (REBREATHING & WASHOUT)
2 series · 12 of 12 positions shown · non-contrast
Comparison: Chest x-ray 07/19/2008

CLINICAL DATA: Short of breath.

NUCLEAR MEDICINE VENTILATION AND PERFUSION SCAN
TECHNIQUE: Wash-in, equilibrium, and wash-out phase ventilation
images were obtained using Ee-EPP gas.  Perfusion images were
obtained in multiple projections after intravenous injection of Tc-
99m MAA.
Radiopharmaceutical: 9 mCi xenon-611 and 6 mCi technetium 99m MAA
IV.

[vq scan · 2.52mm/px · 6 of 20 frames shown (1 of 2)]
[frame 2/20  full-range]
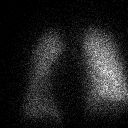
[frame 5/20  full-range]
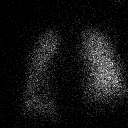
[frame 9/20  full-range]
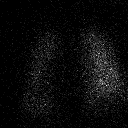
[frame 12/20]
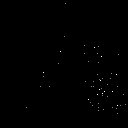
[frame 15/20]
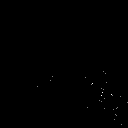
[frame 19/20]
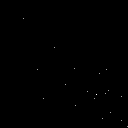

[vq scan · 2.52mm/px · 6 of 20 frames shown (2 of 2)]
[frame 2/20  full-range]
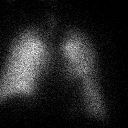
[frame 5/20  full-range]
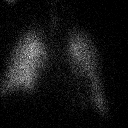
[frame 9/20  full-range]
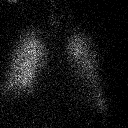
[frame 12/20  full-range]
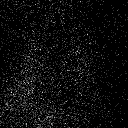
[frame 15/20  full-range]
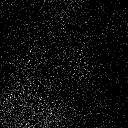
[frame 19/20  full-range]
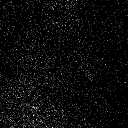

[12 of 12 positions shown; findings below may reference images not displayed]

FINDINGS: The ventilation study shows no significant defect and no
air trapping.

The perfusion study shows no significant perfusion defect.  There
is normal perfusion bilaterally.
IMPRESSION: Very low probability for pulmonary embolism.

## 2009-05-13 IMAGING — CR DG CHEST 2V
2 series · 2 of 2 positions shown · non-contrast
Comparison: 07/19/2008 study

CLINICAL DATA: The history given of dyspnea, congestive heart
failure, chronic obstructive pulmonary disease, and hypertension.

CHEST - 2 VIEW

[w chest pa]
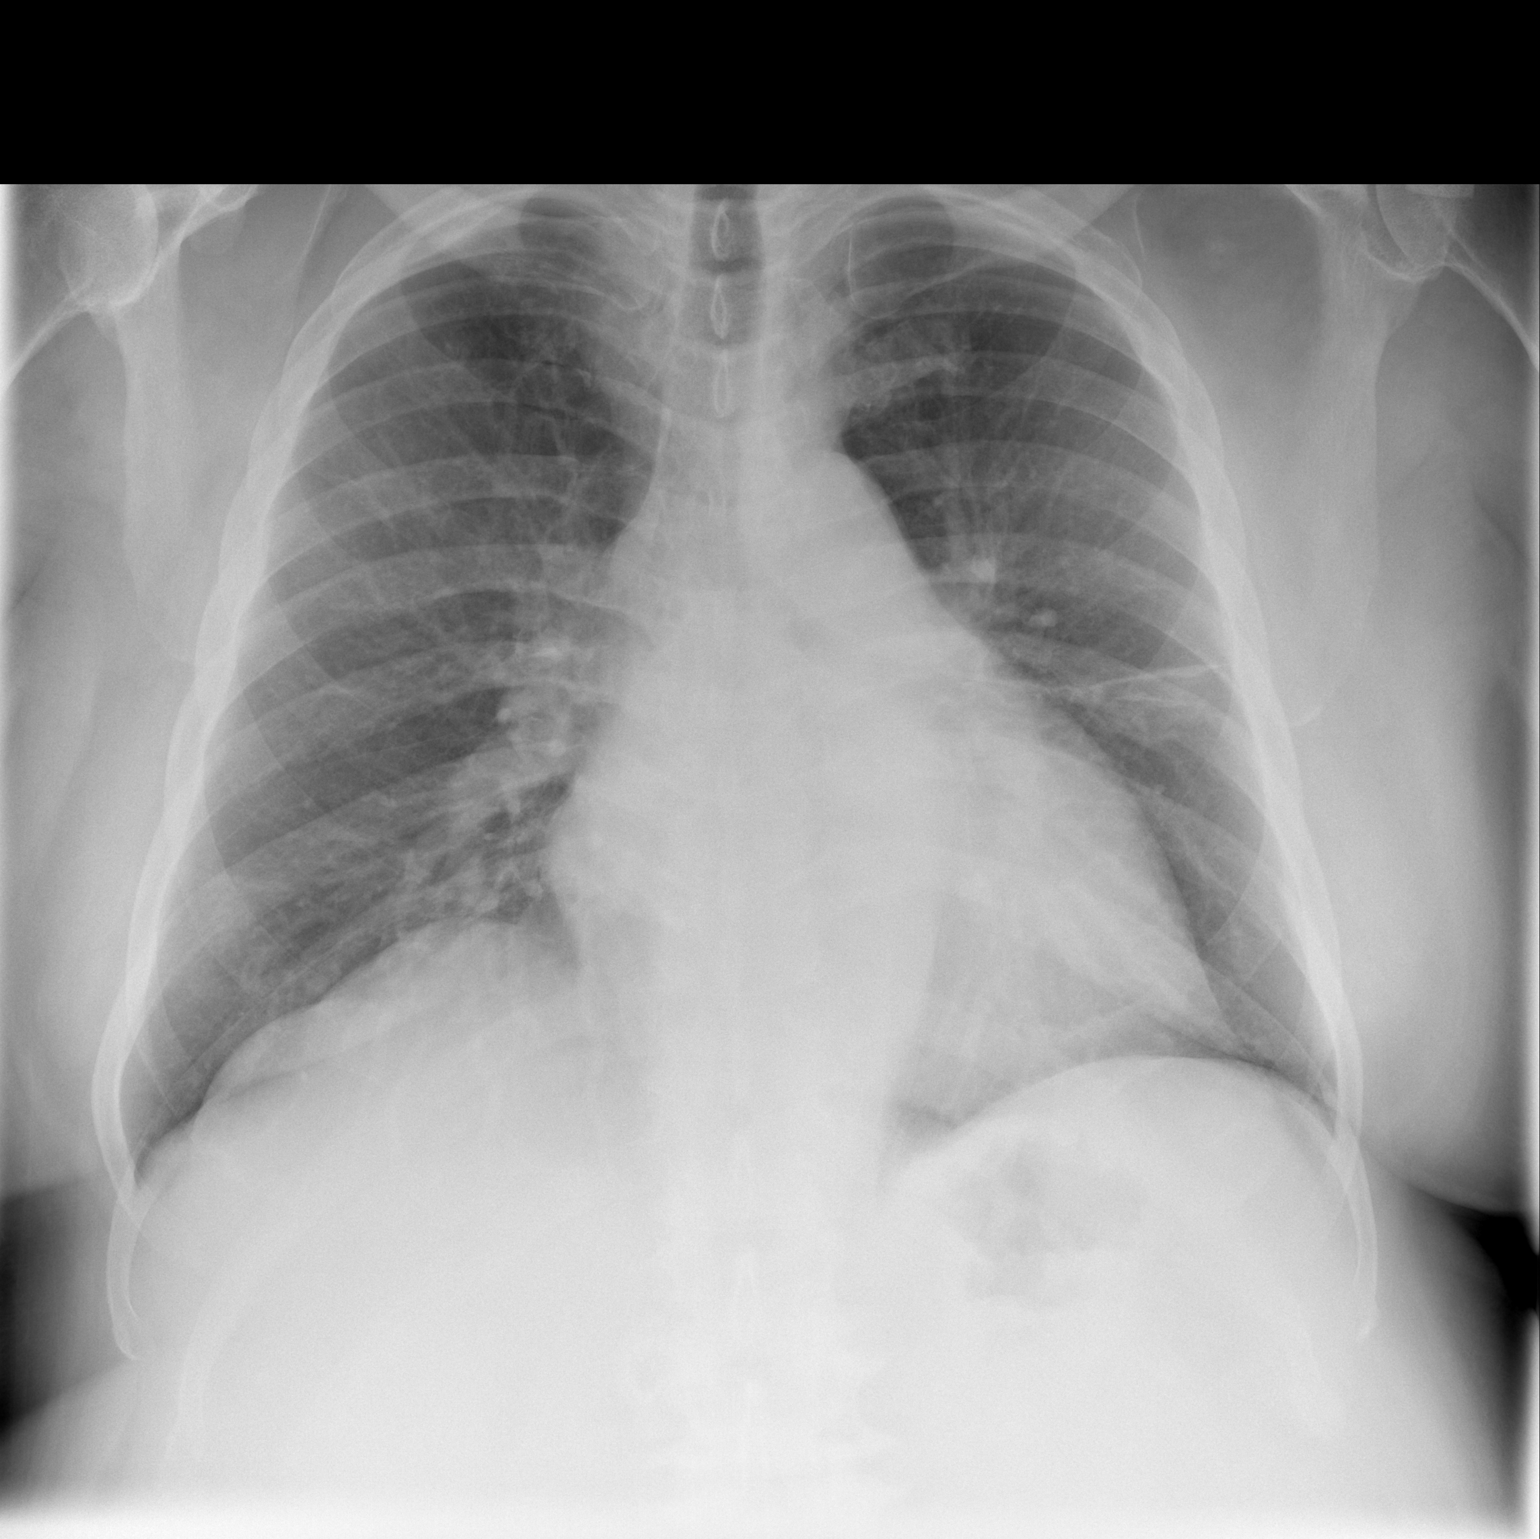

[w chest lat]
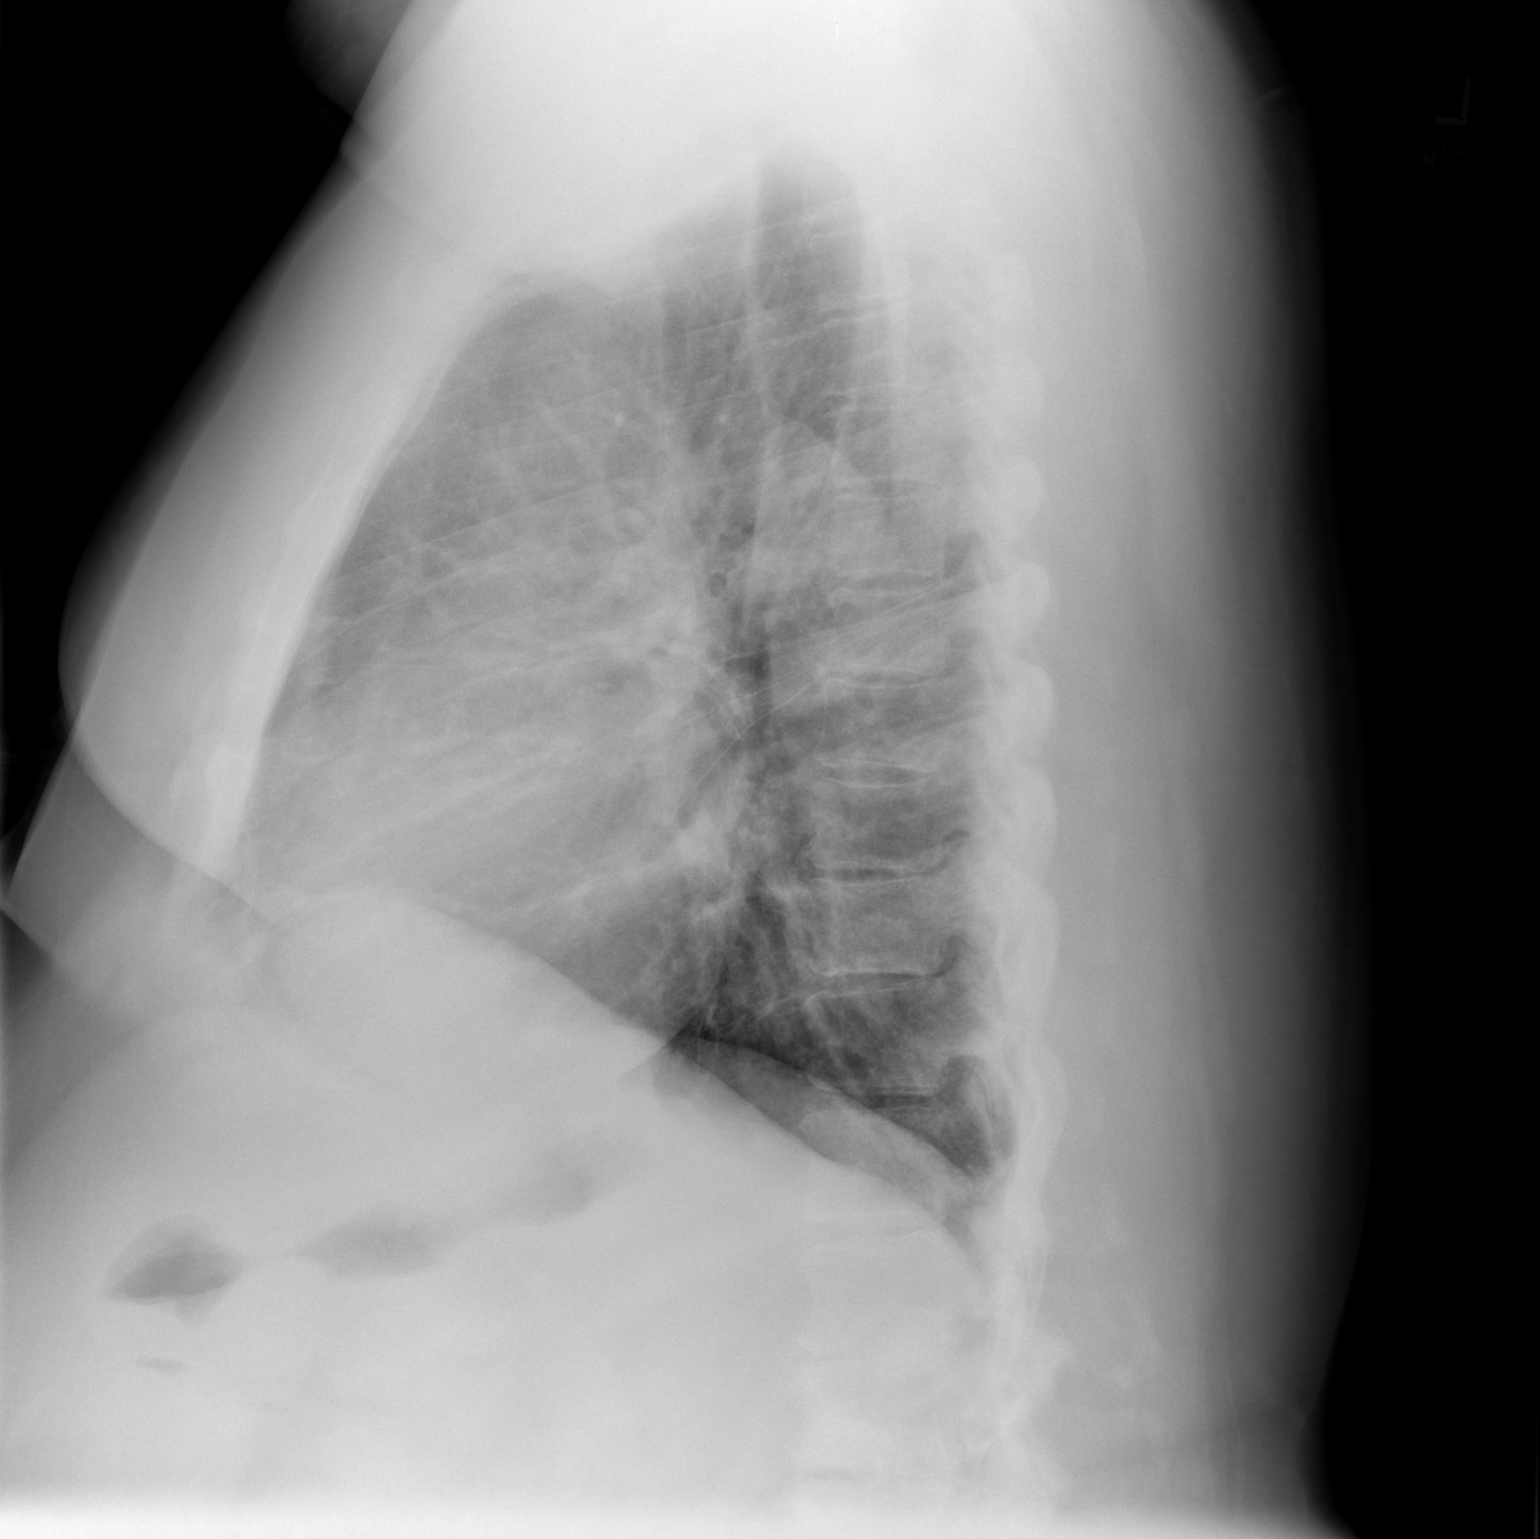

[2 of 2 positions shown; findings below may reference images not displayed]

FINDINGS: Cardiac silhouette is moderately enlarged.  There is
chronic elevation of the right hemidiaphragm.  Density in the left
midlung zone is unchanged .  It may reflect subsegmental
atelectasis or fibrosis or a left-sided minor fissure.  No
pulmonary edema or pneumonia or pleural effusion is seen.  There is
a mild hyperinflation configuration with low-lying diaphragm with
flattening on lateral image.  Bones are osteopenic appearance.  The
patient has undergone previous cervical fusion procedure.  The
hardware is in place.  The lowermost screw on the right may have
loosened.  It projects further away from the plate that was seen on
the study of 11/28/2007.
IMPRESSION: Mild hyperinflation configuration with no pulmonary edema or
pneumonia.

## 2009-05-17 ENCOUNTER — Telehealth: Payer: Self-pay | Admitting: Internal Medicine

## 2009-05-17 ENCOUNTER — Telehealth: Payer: Self-pay | Admitting: Pulmonary Disease

## 2009-05-27 ENCOUNTER — Encounter: Payer: Self-pay | Admitting: Endocrinology

## 2009-05-28 ENCOUNTER — Encounter: Payer: Self-pay | Admitting: Internal Medicine

## 2009-06-03 ENCOUNTER — Ambulatory Visit: Payer: Self-pay | Admitting: Endocrinology

## 2009-06-08 ENCOUNTER — Ambulatory Visit: Payer: Self-pay | Admitting: Pulmonary Disease

## 2009-06-08 DIAGNOSIS — J984 Other disorders of lung: Secondary | ICD-10-CM

## 2009-06-08 HISTORY — DX: Other disorders of lung: J98.4

## 2009-06-09 ENCOUNTER — Telehealth (INDEPENDENT_AMBULATORY_CARE_PROVIDER_SITE_OTHER): Payer: Self-pay | Admitting: *Deleted

## 2009-06-13 ENCOUNTER — Ambulatory Visit: Payer: Self-pay

## 2009-06-13 ENCOUNTER — Encounter: Payer: Self-pay | Admitting: Internal Medicine

## 2009-06-15 ENCOUNTER — Telehealth: Payer: Self-pay | Admitting: Endocrinology

## 2009-06-24 ENCOUNTER — Ambulatory Visit: Payer: Self-pay | Admitting: Internal Medicine

## 2009-07-28 ENCOUNTER — Encounter: Payer: Self-pay | Admitting: Endocrinology

## 2009-08-05 ENCOUNTER — Telehealth: Payer: Self-pay | Admitting: Endocrinology

## 2009-08-19 ENCOUNTER — Encounter: Payer: Self-pay | Admitting: Endocrinology

## 2009-09-02 ENCOUNTER — Ambulatory Visit: Payer: Self-pay | Admitting: Endocrinology

## 2009-09-02 DIAGNOSIS — F0390 Unspecified dementia without behavioral disturbance: Secondary | ICD-10-CM

## 2009-09-03 IMAGING — CR DG CHEST 2V
2 series · 2 of 2 positions shown · non-contrast
Comparison: 07/22/2008

CLINICAL DATA: Shortness of breath.

CHEST - 2 VIEW

[view not recorded (1 of 2)]
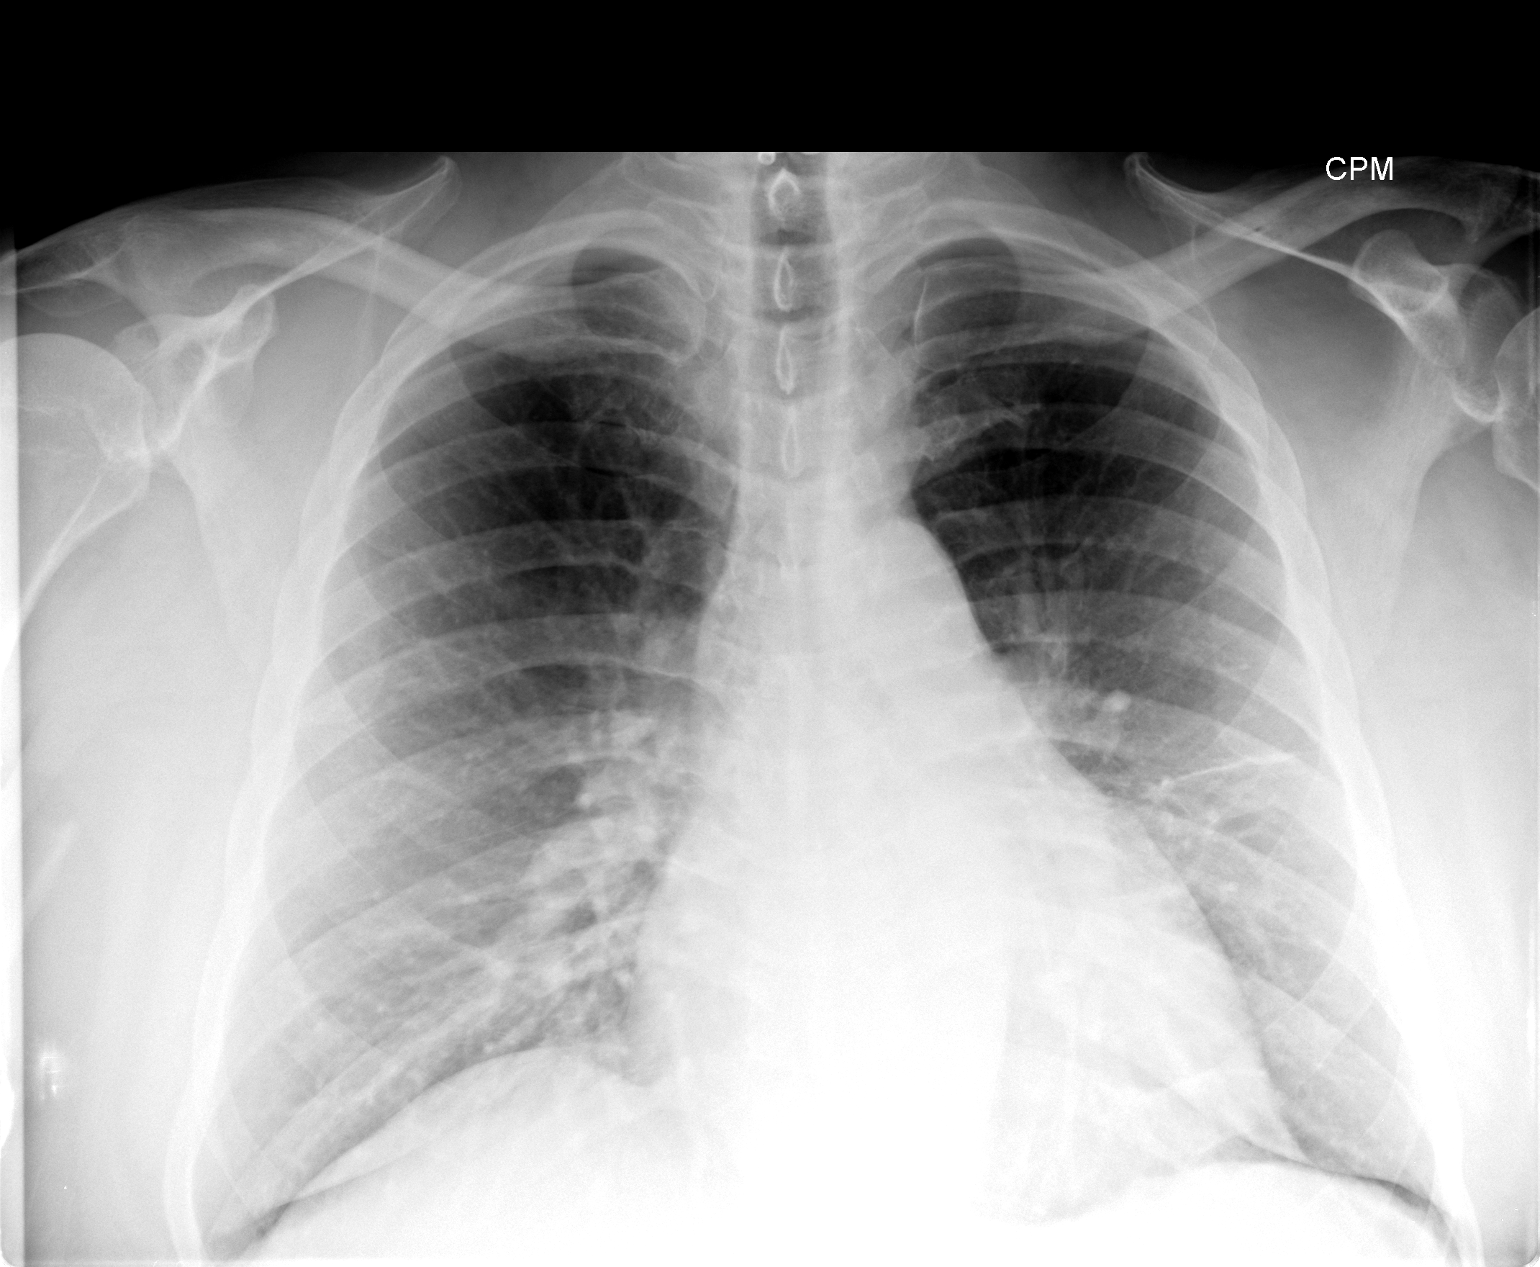

[view not recorded (2 of 2)]
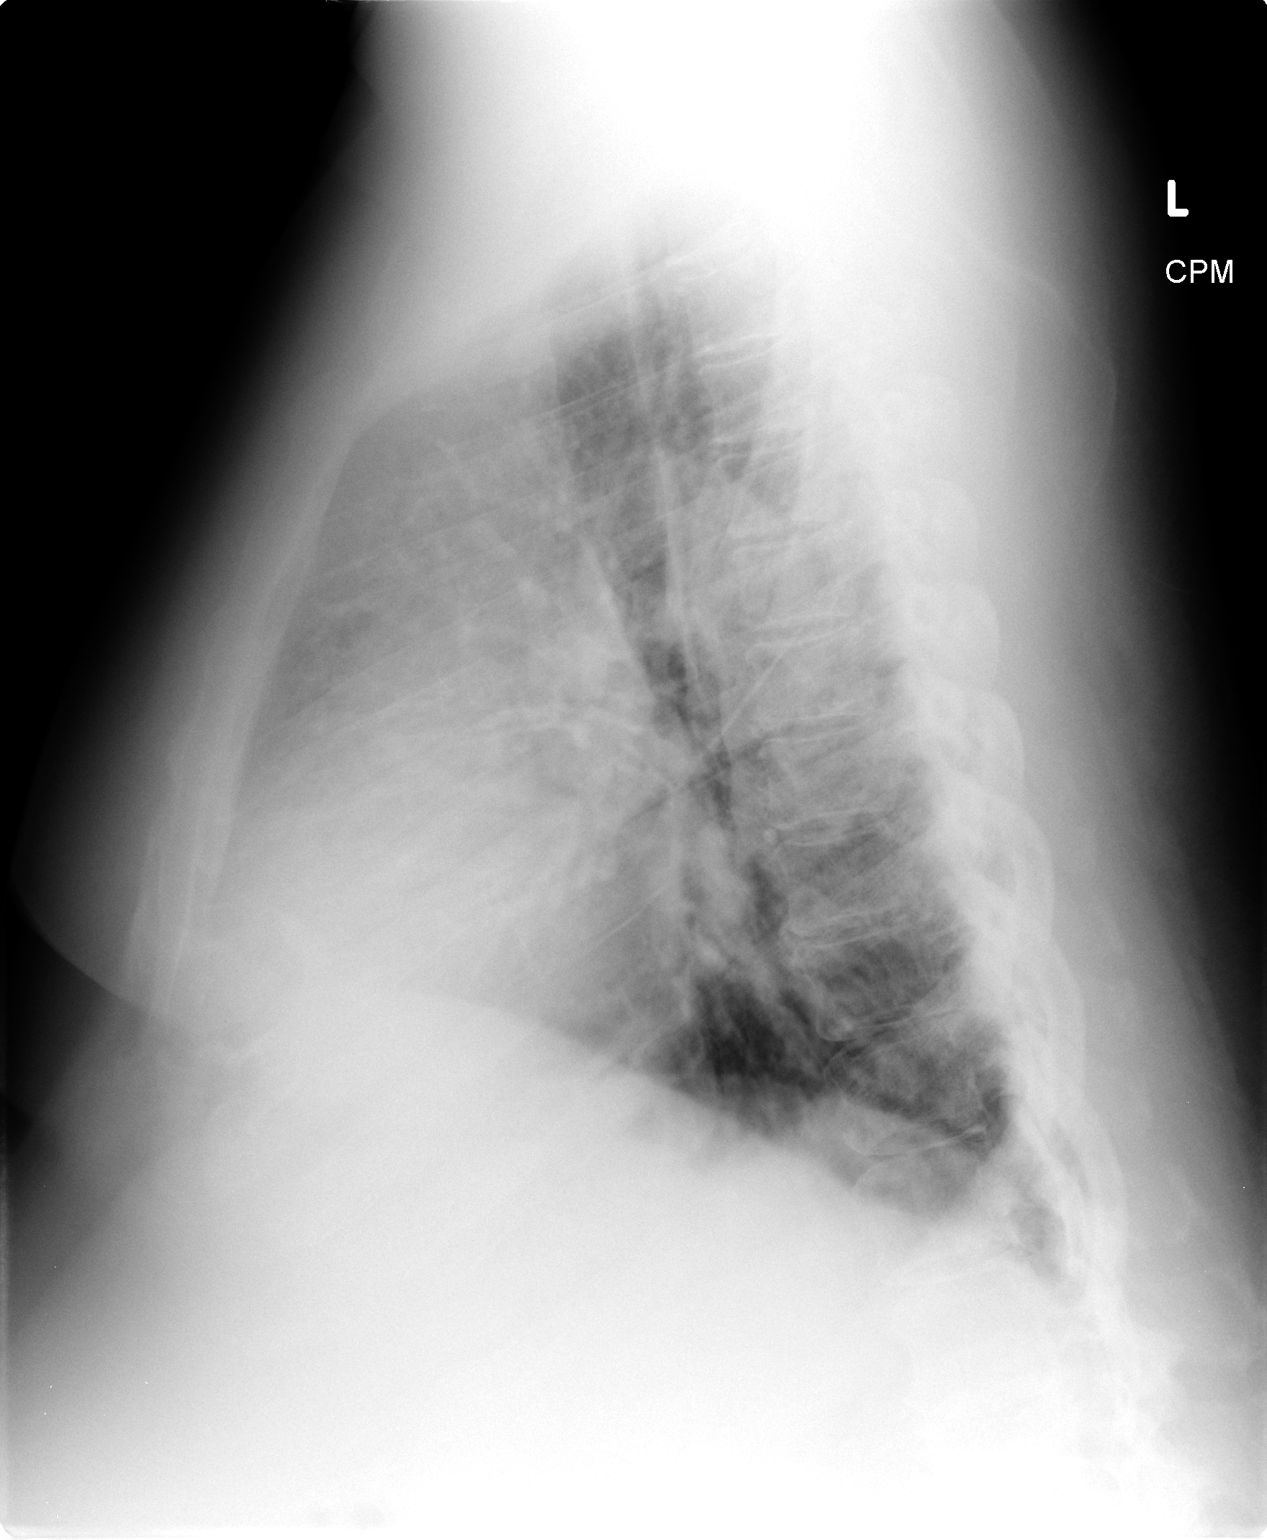

[2 of 2 positions shown; findings below may reference images not displayed]

FINDINGS: Heart size is upper limits of normal.
Mild interstitial opacities are stable.
A scar within the left mid lung is unchanged.
There is no evidence of focal airspace disease, pleural effusions,
or pneumothorax.
No evidence of prior cervical surgery noted with unchanged
hardware.
No acute bony abnormalities are identified.
IMPRESSION: No evidence of acute cardiopulmonary disease.

Interstitial prominence.  High resolution chest CT may be helpful
for further evaluation as clinically indicated.

## 2009-09-09 ENCOUNTER — Telehealth: Payer: Self-pay | Admitting: Endocrinology

## 2009-10-14 ENCOUNTER — Ambulatory Visit: Payer: Self-pay | Admitting: Internal Medicine

## 2009-10-14 DIAGNOSIS — A088 Other specified intestinal infections: Secondary | ICD-10-CM

## 2009-10-14 DIAGNOSIS — N400 Enlarged prostate without lower urinary tract symptoms: Secondary | ICD-10-CM

## 2009-10-14 DIAGNOSIS — Z860100 Personal history of colon polyps, unspecified: Secondary | ICD-10-CM

## 2009-10-14 DIAGNOSIS — F329 Major depressive disorder, single episode, unspecified: Secondary | ICD-10-CM

## 2009-10-14 HISTORY — DX: Personal history of colonic polyps: Z86.010

## 2009-10-14 HISTORY — DX: Personal history of colon polyps, unspecified: Z86.0100

## 2009-10-14 HISTORY — DX: Benign prostatic hyperplasia without lower urinary tract symptoms: N40.0

## 2009-10-14 HISTORY — DX: Other specified intestinal infections: A08.8

## 2009-10-25 ENCOUNTER — Emergency Department (HOSPITAL_COMMUNITY): Admission: EM | Admit: 2009-10-25 | Discharge: 2009-10-25 | Payer: Self-pay | Admitting: Emergency Medicine

## 2009-10-28 ENCOUNTER — Encounter: Payer: Self-pay | Admitting: Endocrinology

## 2009-11-04 ENCOUNTER — Ambulatory Visit: Payer: Self-pay | Admitting: Internal Medicine

## 2009-11-11 ENCOUNTER — Inpatient Hospital Stay (HOSPITAL_BASED_OUTPATIENT_CLINIC_OR_DEPARTMENT_OTHER): Admission: RE | Admit: 2009-11-11 | Discharge: 2009-11-11 | Payer: Self-pay | Admitting: Internal Medicine

## 2009-11-11 ENCOUNTER — Ambulatory Visit: Payer: Self-pay | Admitting: Internal Medicine

## 2009-11-16 ENCOUNTER — Encounter: Payer: Self-pay | Admitting: Internal Medicine

## 2009-11-23 ENCOUNTER — Encounter: Payer: Self-pay | Admitting: Internal Medicine

## 2009-11-24 ENCOUNTER — Ambulatory Visit: Payer: Self-pay | Admitting: Cardiology

## 2009-11-24 ENCOUNTER — Encounter: Payer: Self-pay | Admitting: Physician Assistant

## 2009-11-24 DIAGNOSIS — R5381 Other malaise: Secondary | ICD-10-CM

## 2009-11-24 DIAGNOSIS — R5383 Other fatigue: Secondary | ICD-10-CM

## 2009-11-24 HISTORY — DX: Other malaise: R53.81

## 2009-12-02 ENCOUNTER — Ambulatory Visit: Payer: Self-pay | Admitting: Endocrinology

## 2009-12-06 IMAGING — CT CT ANGIO CHEST
2 of 6 series · 19 of 46 positions shown · IV contrast (omnipaque)
Comparison: Chest radiograph 11/12/2008

CLINICAL DATA: Short of breath, concern pulmonary embolism

CT ANGIOGRAPHY CHEST WITH CONTRAST
TECHNIQUE: Multidetector CT imaging of the chest was performed
using the standard protocol during bolus administration of
intravenous contrast. Multiplanar CT image reconstructions
including MIPs were obtained to evaluate the vascular anatomy.
Contrast: 80 ml Omnipaque 300

[Series 5: pe_xxl 1.0 b20f st · axial · 0.83mm/px · z∈[-311,-43]mm · 16 of 294 slices shown]
[im 13/294  lung]
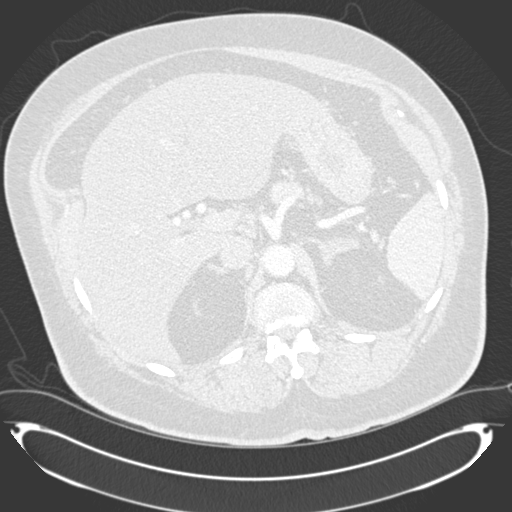
[im 39/294  soft-tissue]
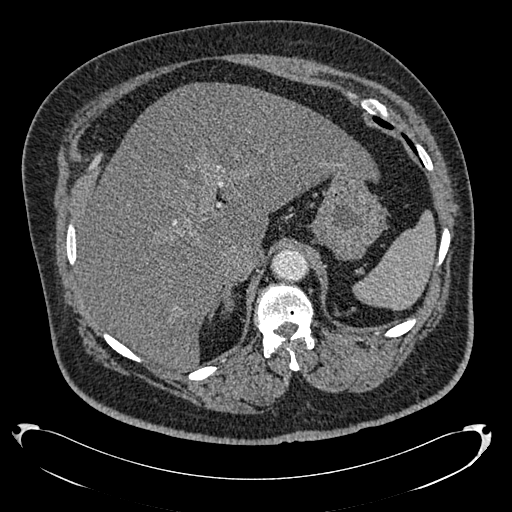
[im 51/294  lung]
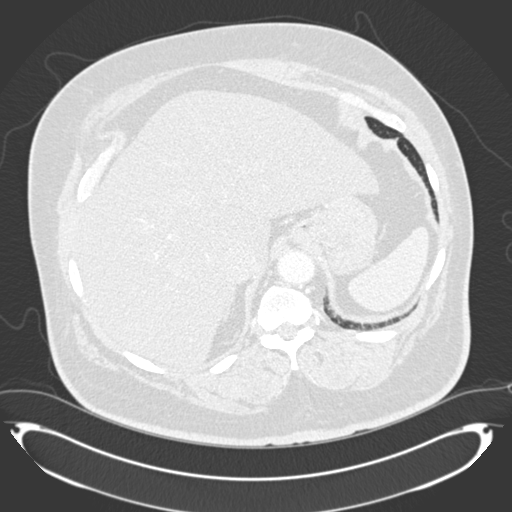
[im 64/294  soft-tissue]
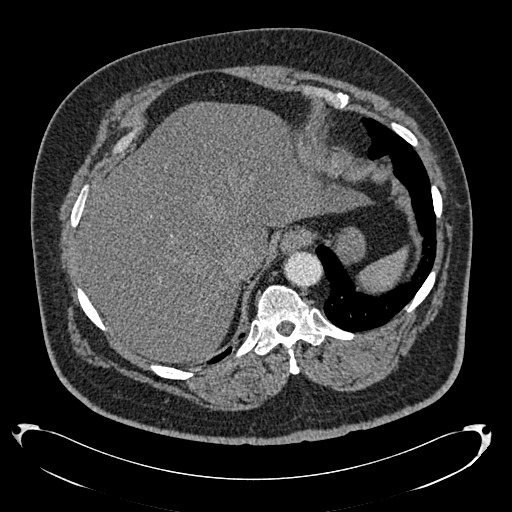
[im 90/294  lung]
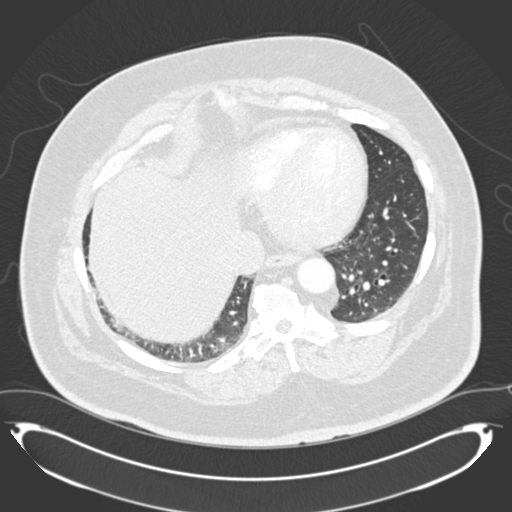
[im 102/294  soft-tissue]
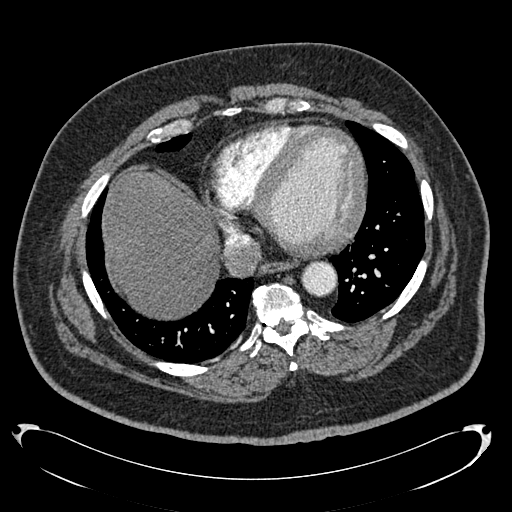
[im 115/294  lung]
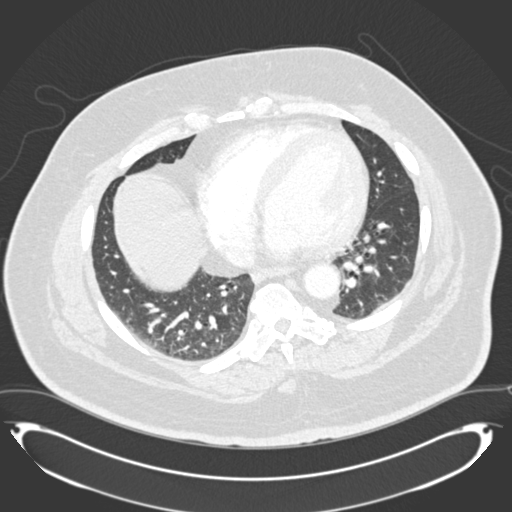
[im 141/294  soft-tissue]
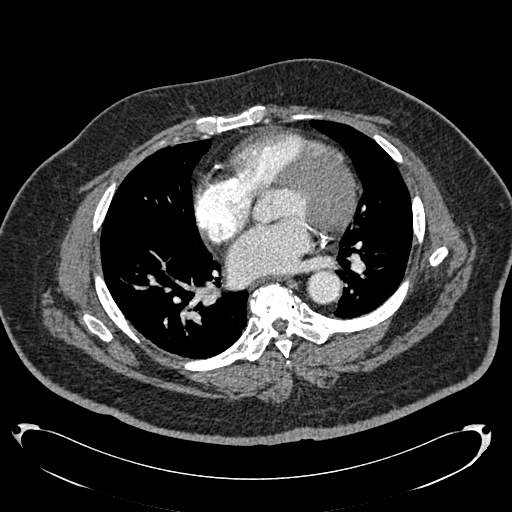
[im 153/294  lung]
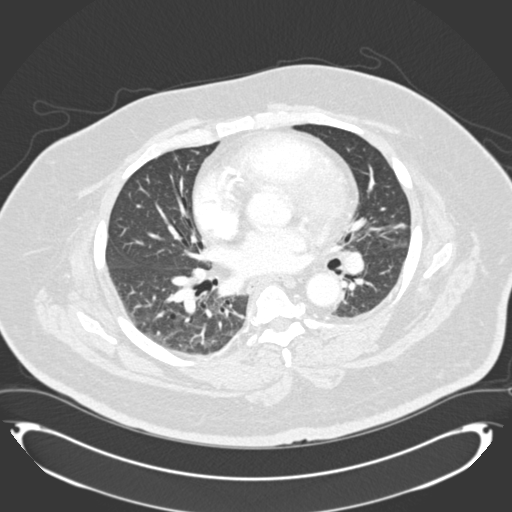
[im 179/294  soft-tissue]
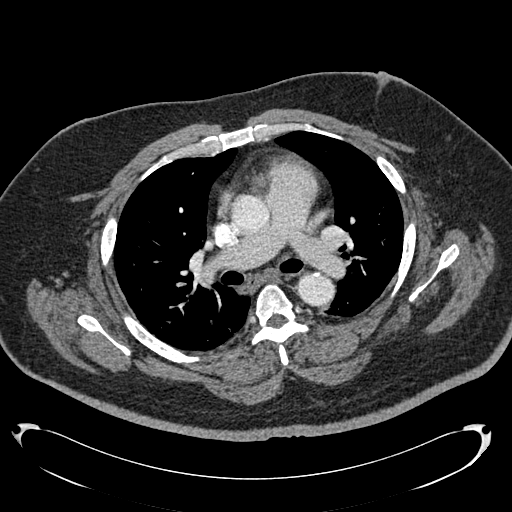
[im 192/294  lung]
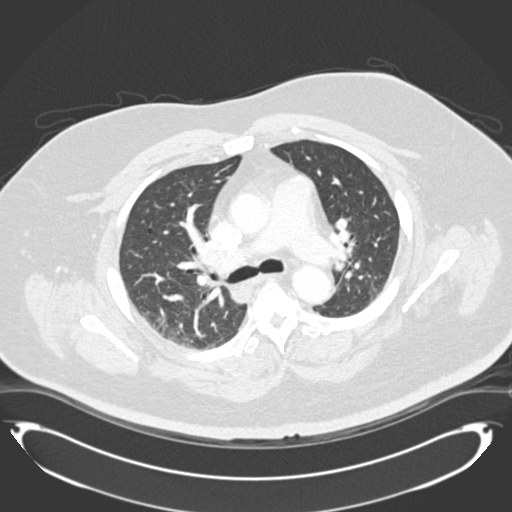
[im 204/294  soft-tissue]
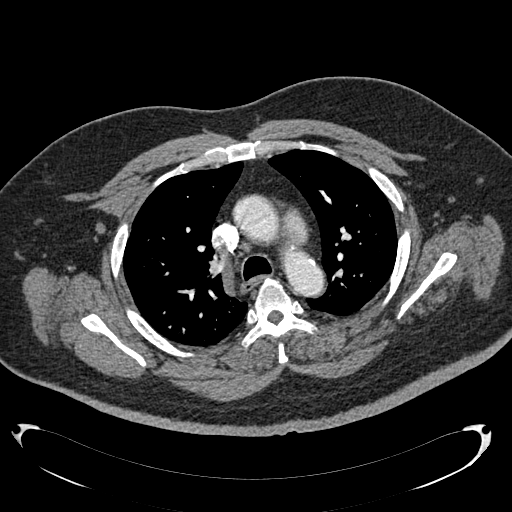
[im 230/294  lung]
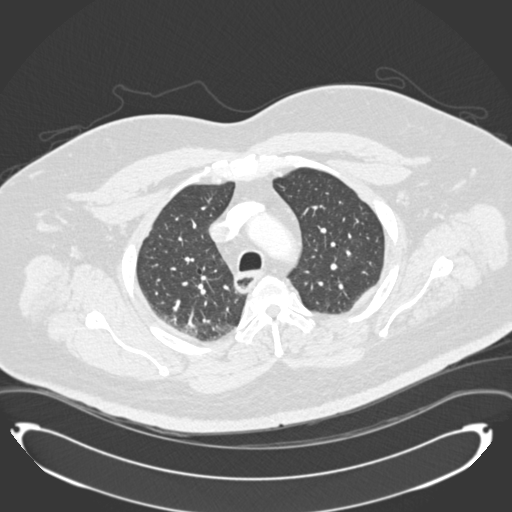
[im 243/294  soft-tissue]
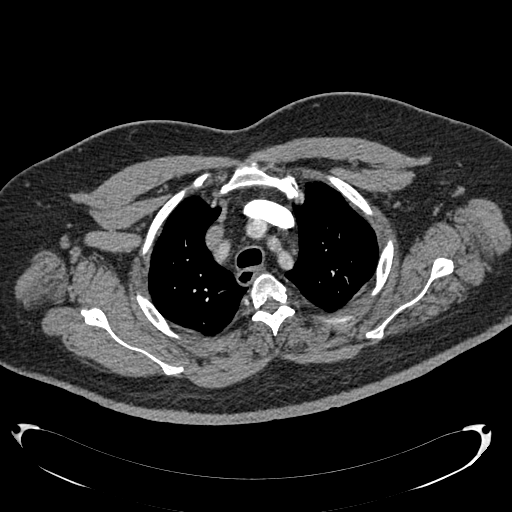
[im 255/294  lung]
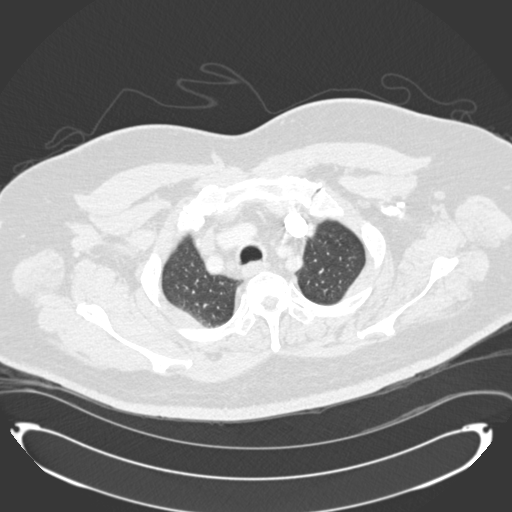
[im 281/294  soft-tissue]
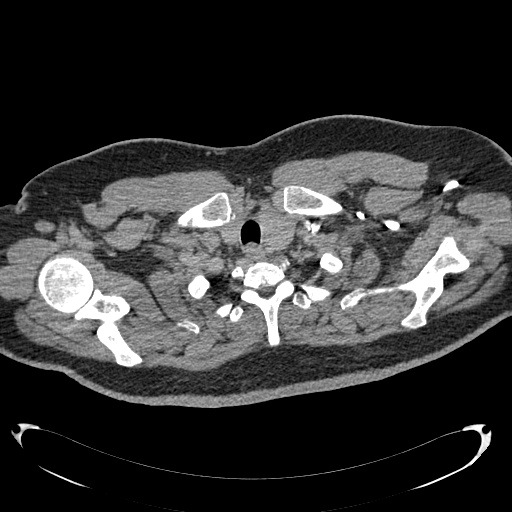

[Series 602: <mpr thick range> · coronal · 0.83mm/px · 3 of 138 slices shown]
[im 35/138  soft-tissue]
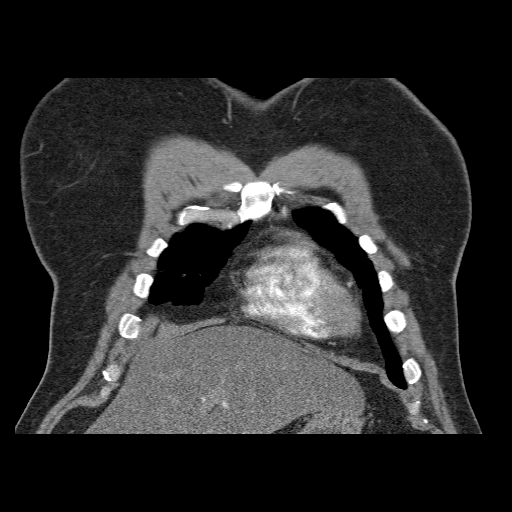
[im 69/138  soft-tissue]
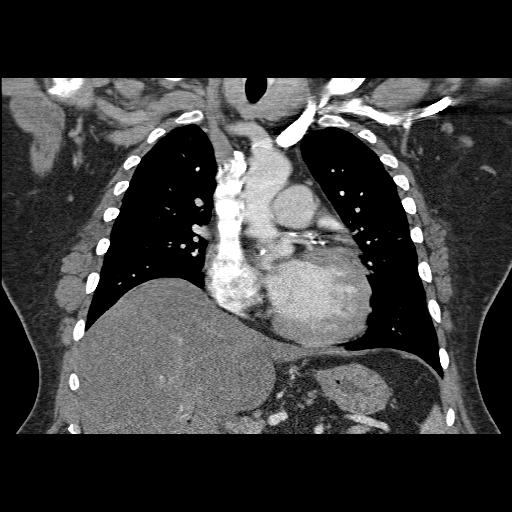
[im 103/138  soft-tissue]
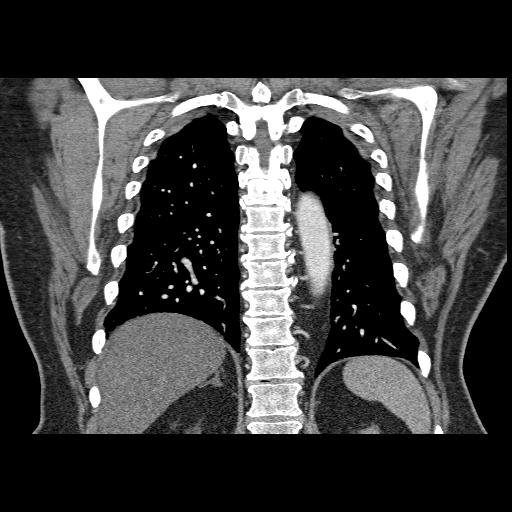

[19 of 46 positions shown; findings below may reference images not displayed]

FINDINGS: This study is suboptimal in that there is preferential
opacification of the aorta over the pulmonary arteries.  There is
no filling defects within the main pulmonary arteries, interlobar
pulmonary arteries or proximal segmental pulmonary arteries to
suggest acute pulmonary embolism.  The distal segmental pulmonary
arteries and subsegmental pulmonary arteries are poorly evaluated.

No evidence of pericardial effusion.  The aorta and great vessels
appear normal.

Review lung windows demonstrates no suspicious pulmonary nodule.
No pleural fluid pneumothorax.  The airways appear normal.

Limited view of the upper abdomen is unremarkable.

Limited view of the skeleton is unremarkable.

Review of the MIP images confirms the above findings.
IMPRESSION: No evidence of acute pulmonary embolism in suboptimal study.

## 2009-12-08 IMAGING — MR MR LUMBAR SPINE W/O CM
4 of 7 series · 16 of 48 positions shown · non-contrast
Comparison: Radiographs 02/06/2007

CLINICAL DATA: Chronic low back pain with bilateral leg weakness.

MRI LUMBAR SPINE WITHOUT CONTRAST
TECHNIQUE: Multiplanar and multiecho pulse sequences of the lumbar
spine were obtained without intravenous contrast.

[Series 4: T2 · sagittal · 4.5mm · 0.44mm/px · 3 of 11 slices shown (1 of 2)]
[im 1/11]
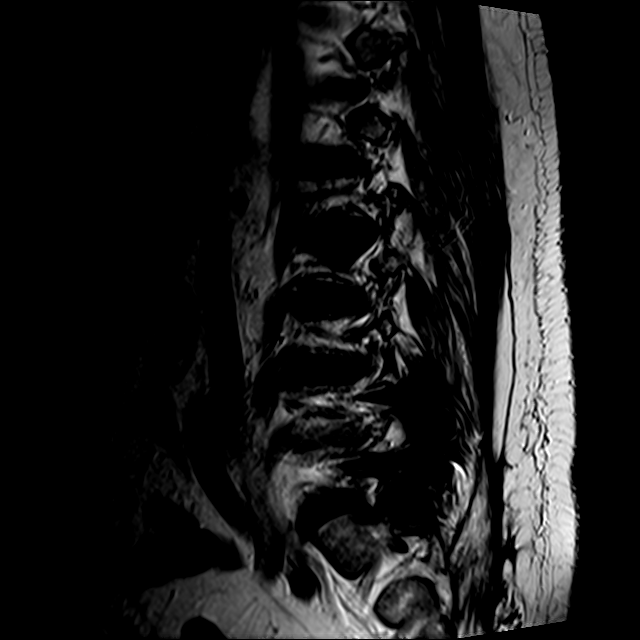
[im 6/11]
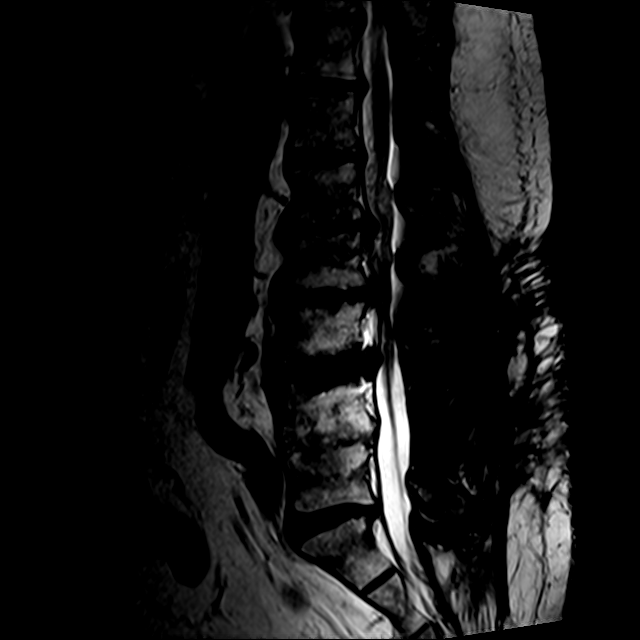
[im 11/11]
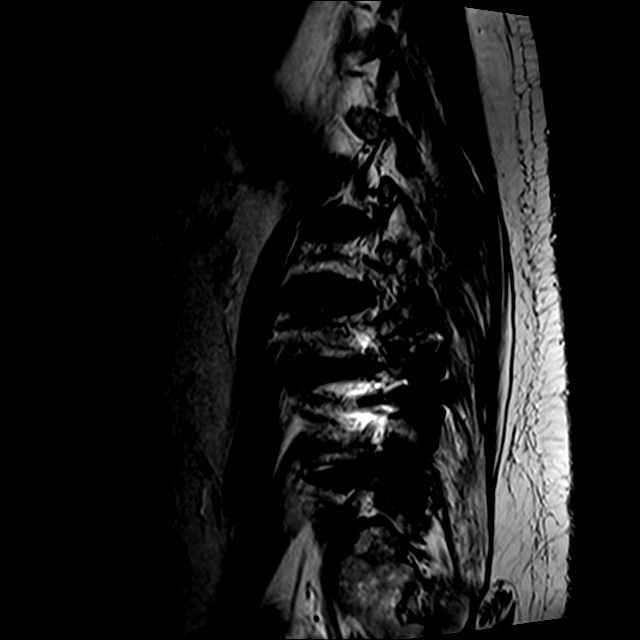

[Series 5: T1 · sagittal · 4.5mm · 0.44mm/px · 3 of 11 slices shown (1 of 2)]
[im 1/11]
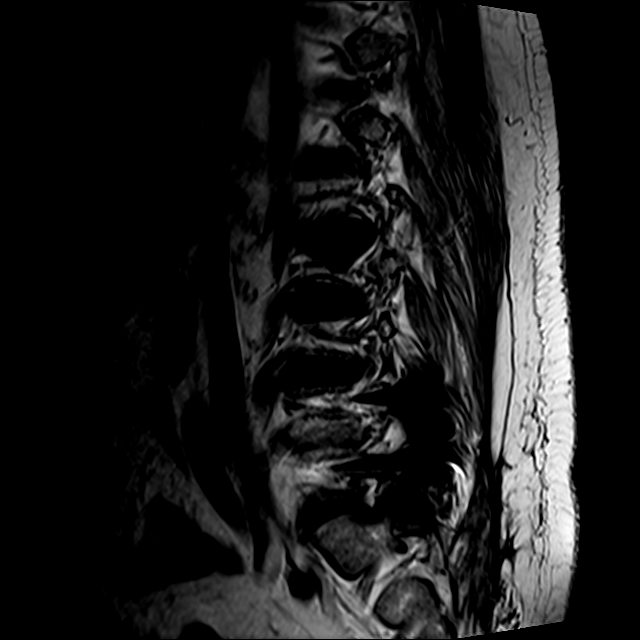
[im 6/11]
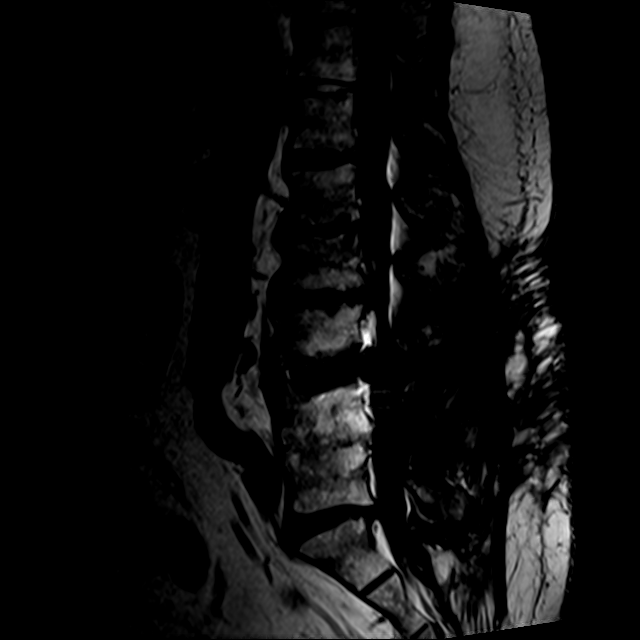
[im 11/11]
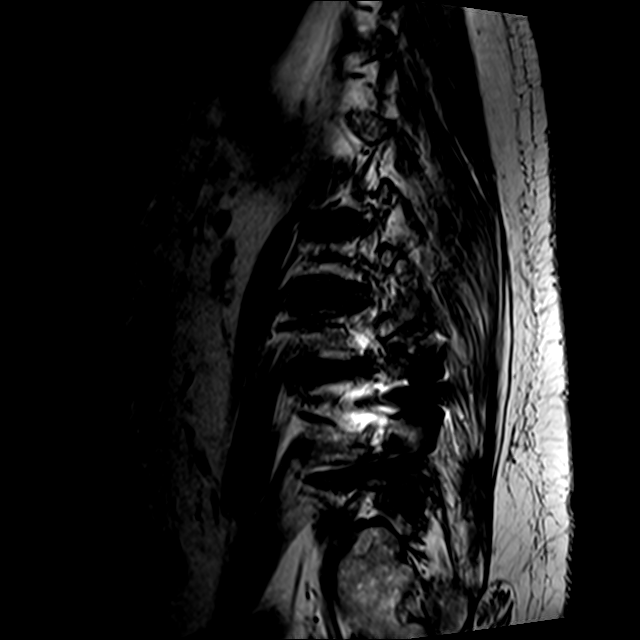

[Series 7: T1 · axial · 4.0mm · 0.45mm/px · z∈[-94,+65]mm · 3 of 46 slices shown (2 of 2)]
[im 9/46]
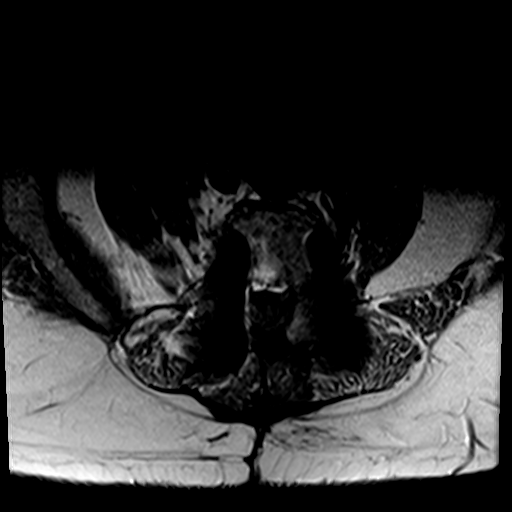
[im 25/46]
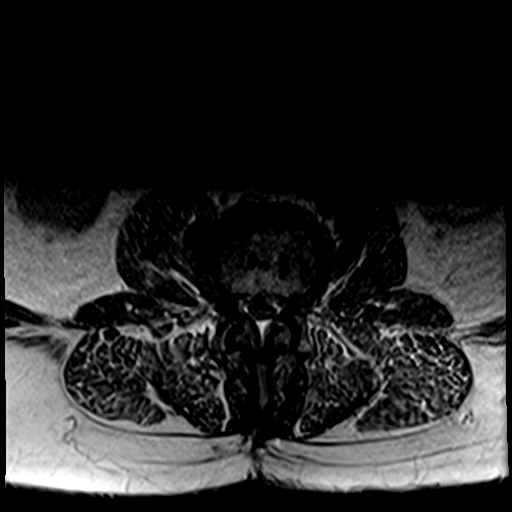
[im 41/46]
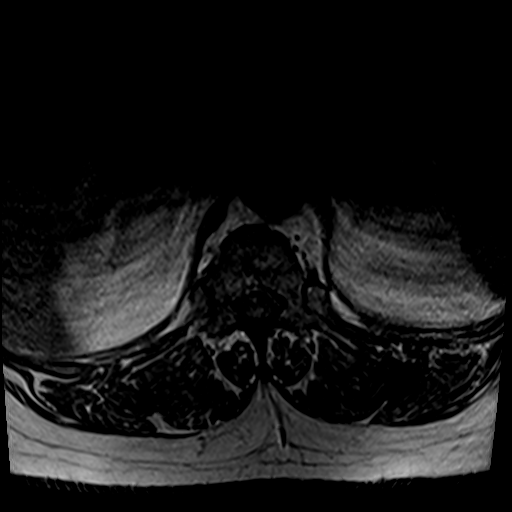

[Series 8: T2 · axial · 4.0mm · 0.45mm/px · z∈[-129,+65]mm · 7 of 46 slices shown (2 of 2)]
[im 1/46]
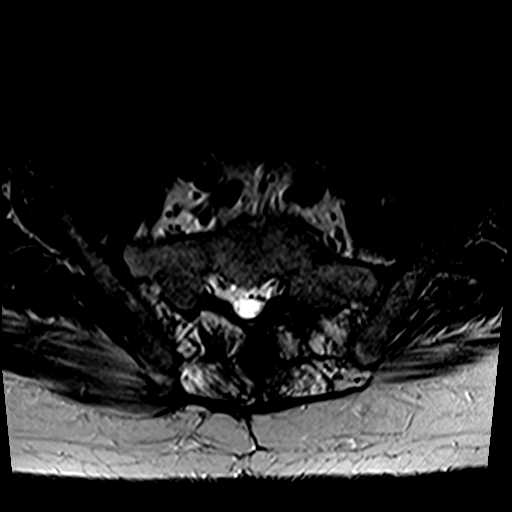
[im 9/46]
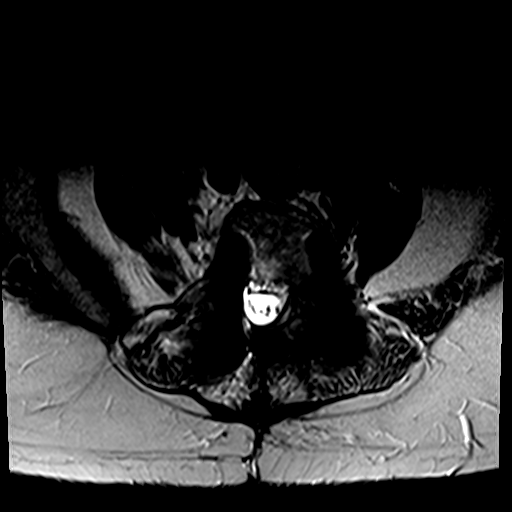
[im 13/46]
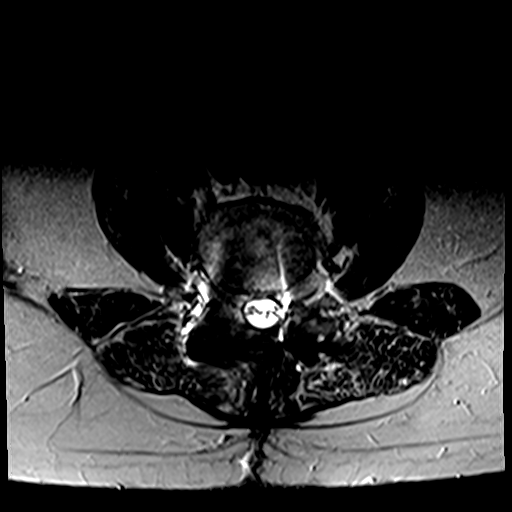
[im 21/46]
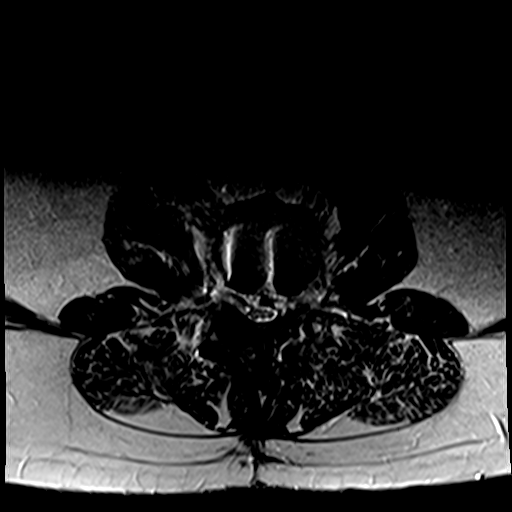
[im 25/46]
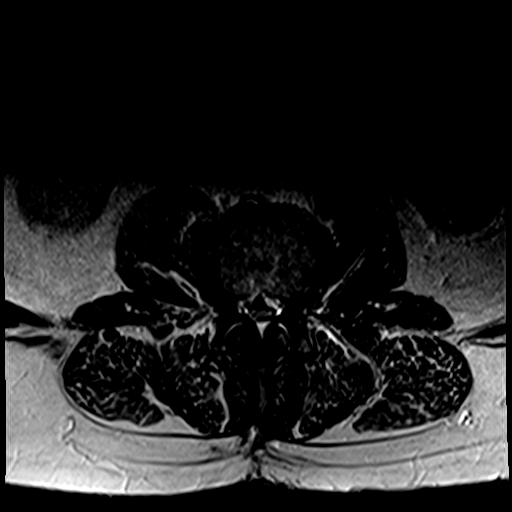
[im 33/46]
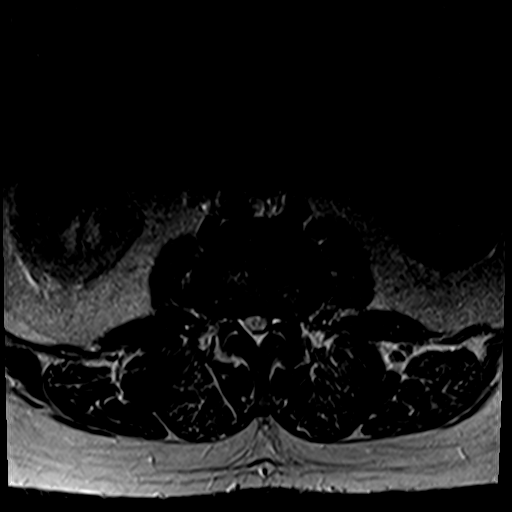
[im 41/46]
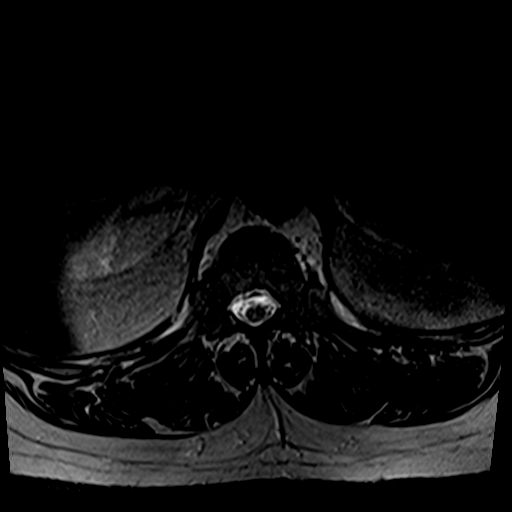

[16 of 48 positions shown; findings below may reference images not displayed]

FINDINGS: The patient has undergone previous fusion L4-L5 with
posterior pedicle screw plate fixation and interbody bone plugs.
There is bony incorporation of the bone plugs without
pseudoarthrosis.  Interbody spacers are appreciated at L3-L4.  No
subsidence is identified.  There is narrowing of the L3 foramina
without encroachment on the nerve root. Diffuse lumbar degenerative
disc disease with marked disc height loss at L1-L2, L2-L3 and L5-S1
is appreciated.  The conus terminates at the T12 level which is
unremarkable.

Axial imaging: The individual disc spaces are examined as follows:

L1-2: Mild multifactorial central stenosis with diffuse disc bulge
and facet osteoarthritis.  No encroachment on the exiting nerve
roots.

L2-3: Extensive epidural fat is appreciated.  The thecal sac as a
trefoil configuration.  Facet degenerative changes bilaterally.
There is narrowing of the foramina bilaterally without appreciable
encroachment on the L2 roots.

L3-4: Interbody spacers are in satisfactory position.  There is
extensive artifact.  Narrowing of the left foramen.  No appreciable
encroachment on the nerve root.

L4-5: Fusion hardware well positioned without central or foraminal
stenosis.

L5-S1: The left hemilaminectomy defect is identified.  Facet
degenerative changes left greater than right.  There is left
subarticular lateral recess stenosis.  Superiorly secondary facet
hypertrophy.  Multifactorial right foraminal narrowing without
definite encroachment on the nerve root.
IMPRESSION: 1.  The patient has advanced multilevel degenerative changes.  The
fusion at L3-L4, L4-S5 is intact.
2.  Diffuse foraminal narrowing bilaterally as described above.  No
encroachment on the exiting nerve roots.

## 2009-12-28 ENCOUNTER — Ambulatory Visit: Payer: Self-pay | Admitting: Cardiology

## 2009-12-28 ENCOUNTER — Inpatient Hospital Stay (HOSPITAL_COMMUNITY): Admission: EM | Admit: 2009-12-28 | Discharge: 2010-01-21 | Payer: Self-pay | Admitting: Emergency Medicine

## 2010-01-06 ENCOUNTER — Encounter (INDEPENDENT_AMBULATORY_CARE_PROVIDER_SITE_OTHER): Payer: Self-pay | Admitting: Internal Medicine

## 2010-01-25 ENCOUNTER — Encounter: Payer: Self-pay | Admitting: Endocrinology

## 2010-01-26 ENCOUNTER — Telehealth (INDEPENDENT_AMBULATORY_CARE_PROVIDER_SITE_OTHER): Payer: Self-pay | Admitting: *Deleted

## 2010-01-30 ENCOUNTER — Ambulatory Visit: Payer: Self-pay | Admitting: Internal Medicine

## 2010-01-30 DIAGNOSIS — I959 Hypotension, unspecified: Secondary | ICD-10-CM

## 2010-01-30 HISTORY — DX: Hypotension, unspecified: I95.9

## 2010-02-01 ENCOUNTER — Ambulatory Visit: Payer: Self-pay | Admitting: Internal Medicine

## 2010-02-01 DIAGNOSIS — E119 Type 2 diabetes mellitus without complications: Secondary | ICD-10-CM

## 2010-02-01 DIAGNOSIS — R740 Nonspecific elevation of levels of transaminase and lactic acid dehydrogenase [LDH]: Secondary | ICD-10-CM

## 2010-02-01 DIAGNOSIS — R7401 Elevation of levels of liver transaminase levels: Secondary | ICD-10-CM

## 2010-02-01 HISTORY — DX: Elevation of levels of liver transaminase levels: R74.01

## 2010-02-01 HISTORY — DX: Type 2 diabetes mellitus without complications: E11.9

## 2010-02-02 ENCOUNTER — Ambulatory Visit: Payer: Self-pay | Admitting: Internal Medicine

## 2010-02-02 DIAGNOSIS — E0590 Thyrotoxicosis, unspecified without thyrotoxic crisis or storm: Secondary | ICD-10-CM | POA: Insufficient documentation

## 2010-02-02 LAB — CONVERTED CEMR LAB
ALT: 42 units/L (ref 0–53)
AST: 30 units/L (ref 0–37)
Albumin: 3.5 g/dL (ref 3.5–5.2)
BUN: 25 mg/dL — ABNORMAL HIGH (ref 6–23)
Creatinine, Ser: 7.3 mg/dL (ref 0.4–1.5)
GFR calc non Af Amer: 9.86 mL/min (ref >60)
Glucose, Bld: 163 mg/dL — ABNORMAL HIGH (ref 70–99)
Potassium: 4.5 meq/L (ref 3.5–5.1)
TSH: 0.17 microintl units/mL — ABNORMAL LOW (ref 0.35–5.50)
Total Bilirubin: 1.1 mg/dL (ref 0.3–1.2)

## 2010-02-03 ENCOUNTER — Telehealth (INDEPENDENT_AMBULATORY_CARE_PROVIDER_SITE_OTHER): Payer: Self-pay | Admitting: *Deleted

## 2010-02-06 ENCOUNTER — Ambulatory Visit: Payer: Self-pay | Admitting: Endocrinology

## 2010-02-06 LAB — CONVERTED CEMR LAB: Hgb A1c MFr Bld: 5.4 % (ref 4.6–6.5)

## 2010-02-10 ENCOUNTER — Telehealth: Payer: Self-pay | Admitting: Internal Medicine

## 2010-02-14 ENCOUNTER — Encounter: Payer: Self-pay | Admitting: Internal Medicine

## 2010-02-15 ENCOUNTER — Encounter: Payer: Self-pay | Admitting: Internal Medicine

## 2010-02-17 ENCOUNTER — Encounter: Payer: Self-pay | Admitting: Internal Medicine

## 2010-02-17 ENCOUNTER — Encounter: Payer: Self-pay | Admitting: Endocrinology

## 2010-02-22 ENCOUNTER — Encounter: Payer: Self-pay | Admitting: Internal Medicine

## 2010-02-28 ENCOUNTER — Ambulatory Visit: Payer: Self-pay | Admitting: Internal Medicine

## 2010-03-03 ENCOUNTER — Telehealth: Payer: Self-pay | Admitting: Internal Medicine

## 2010-03-03 DIAGNOSIS — R269 Unspecified abnormalities of gait and mobility: Secondary | ICD-10-CM

## 2010-03-03 HISTORY — DX: Unspecified abnormalities of gait and mobility: R26.9

## 2010-03-15 ENCOUNTER — Emergency Department (HOSPITAL_COMMUNITY): Admission: EM | Admit: 2010-03-15 | Discharge: 2010-03-15 | Payer: Self-pay | Admitting: Emergency Medicine

## 2010-03-15 ENCOUNTER — Encounter (HOSPITAL_COMMUNITY): Admission: RE | Admit: 2010-03-15 | Discharge: 2010-06-13 | Payer: Self-pay | Admitting: Endocrinology

## 2010-03-22 ENCOUNTER — Ambulatory Visit (HOSPITAL_COMMUNITY): Admission: RE | Admit: 2010-03-22 | Discharge: 2010-03-22 | Payer: Self-pay | Admitting: Gastroenterology

## 2010-03-27 ENCOUNTER — Encounter: Admission: RE | Admit: 2010-03-27 | Discharge: 2010-06-25 | Payer: Self-pay | Admitting: Internal Medicine

## 2010-03-28 ENCOUNTER — Encounter: Payer: Self-pay | Admitting: Internal Medicine

## 2010-03-29 ENCOUNTER — Emergency Department (HOSPITAL_COMMUNITY): Admission: EM | Admit: 2010-03-29 | Discharge: 2010-03-29 | Payer: Self-pay | Admitting: Emergency Medicine

## 2010-03-29 ENCOUNTER — Ambulatory Visit: Payer: Self-pay | Admitting: Internal Medicine

## 2010-03-29 DIAGNOSIS — R079 Chest pain, unspecified: Secondary | ICD-10-CM | POA: Insufficient documentation

## 2010-04-19 ENCOUNTER — Encounter: Payer: Self-pay | Admitting: Pulmonary Disease

## 2010-04-24 ENCOUNTER — Ambulatory Visit: Payer: Self-pay | Admitting: Endocrinology

## 2010-04-27 LAB — CONVERTED CEMR LAB: TSH: 0.34 microintl units/mL — ABNORMAL LOW (ref 0.35–5.50)

## 2010-05-03 ENCOUNTER — Encounter: Payer: Self-pay | Admitting: Internal Medicine

## 2010-05-05 ENCOUNTER — Ambulatory Visit: Payer: Self-pay | Admitting: Internal Medicine

## 2010-05-06 LAB — CONVERTED CEMR LAB
BUN: 43 mg/dL — ABNORMAL HIGH (ref 6–23)
Basophils Absolute: 0 10*3/uL (ref 0.0–0.1)
Bilirubin, Direct: 0.1 mg/dL (ref 0.0–0.3)
CO2: 30 meq/L (ref 19–32)
Calcium: 10.9 mg/dL — ABNORMAL HIGH (ref 8.4–10.5)
Chloride: 99 meq/L (ref 96–112)
Creatinine, Ser: 9.9 mg/dL (ref 0.4–1.5)
Eosinophils Absolute: 0.2 10*3/uL (ref 0.0–0.7)
HDL: 56.4 mg/dL (ref >39.00)
Lymphocytes Relative: 32.6 % (ref 12.0–46.0)
MCHC: 32.7 g/dL (ref 30.0–36.0)
MCV: 90.6 fL (ref 78.0–100.0)
Monocytes Absolute: 0.5 10*3/uL (ref 0.1–1.0)
Neutrophils Relative %: 59.8 % (ref 43.0–77.0)
PSA: 1.98 ng/mL (ref 0.10–4.00)
Platelets: 303 10*3/uL (ref 150.0–400.0)
RDW: 20.6 % — ABNORMAL HIGH (ref 11.5–14.6)
Total Bilirubin: 0.6 mg/dL (ref 0.3–1.2)
Total CHOL/HDL Ratio: 8
Total Protein: 7.9 g/dL (ref 6.0–8.3)
Triglycerides: 323 mg/dL — ABNORMAL HIGH (ref 0.0–149.0)
VLDL: 64.6 mg/dL — ABNORMAL HIGH (ref 0.0–40.0)
pH: 6 (ref 5.0–8.0)

## 2010-05-08 ENCOUNTER — Ambulatory Visit: Payer: Self-pay | Admitting: Endocrinology

## 2010-05-11 ENCOUNTER — Encounter: Payer: Self-pay | Admitting: Internal Medicine

## 2010-05-16 ENCOUNTER — Ambulatory Visit (HOSPITAL_COMMUNITY): Admission: RE | Admit: 2010-05-16 | Discharge: 2010-05-16 | Payer: Self-pay | Admitting: Nephrology

## 2010-05-17 ENCOUNTER — Ambulatory Visit: Payer: Self-pay | Admitting: Vascular Surgery

## 2010-05-17 ENCOUNTER — Ambulatory Visit (HOSPITAL_COMMUNITY): Admission: RE | Admit: 2010-05-17 | Discharge: 2010-05-17 | Payer: Self-pay | Admitting: Vascular Surgery

## 2010-05-19 ENCOUNTER — Ambulatory Visit: Payer: Self-pay | Admitting: Vascular Surgery

## 2010-05-19 ENCOUNTER — Ambulatory Visit (HOSPITAL_COMMUNITY): Admission: AD | Admit: 2010-05-19 | Discharge: 2010-05-20 | Payer: Self-pay | Admitting: Surgery

## 2010-05-26 ENCOUNTER — Ambulatory Visit: Payer: Self-pay | Admitting: Vascular Surgery

## 2010-05-27 ENCOUNTER — Emergency Department (HOSPITAL_COMMUNITY): Admission: EM | Admit: 2010-05-27 | Discharge: 2010-05-27 | Payer: Self-pay | Admitting: Emergency Medicine

## 2010-05-31 ENCOUNTER — Ambulatory Visit (HOSPITAL_COMMUNITY): Admission: RE | Admit: 2010-05-31 | Discharge: 2010-05-31 | Payer: Self-pay | Admitting: Vascular Surgery

## 2010-06-07 ENCOUNTER — Ambulatory Visit: Payer: Self-pay | Admitting: Internal Medicine

## 2010-06-07 ENCOUNTER — Ambulatory Visit: Payer: Self-pay | Admitting: Cardiovascular Disease

## 2010-06-07 HISTORY — DX: Hypocalcemia: E83.51

## 2010-06-23 ENCOUNTER — Ambulatory Visit: Payer: Self-pay | Admitting: Vascular Surgery

## 2010-06-23 ENCOUNTER — Ambulatory Visit (HOSPITAL_COMMUNITY): Admission: RE | Admit: 2010-06-23 | Discharge: 2010-06-23 | Payer: Self-pay | Admitting: Vascular Surgery

## 2010-06-26 ENCOUNTER — Encounter: Payer: Self-pay | Admitting: Internal Medicine

## 2010-06-30 ENCOUNTER — Ambulatory Visit: Payer: Self-pay | Admitting: Vascular Surgery

## 2010-07-14 ENCOUNTER — Ambulatory Visit: Payer: Self-pay | Admitting: Vascular Surgery

## 2010-07-17 ENCOUNTER — Ambulatory Visit: Payer: Self-pay | Admitting: Internal Medicine

## 2010-07-17 DIAGNOSIS — N62 Hypertrophy of breast: Secondary | ICD-10-CM

## 2010-07-17 DIAGNOSIS — R42 Dizziness and giddiness: Secondary | ICD-10-CM

## 2010-07-17 HISTORY — DX: Hypertrophy of breast: N62

## 2010-07-17 LAB — CONVERTED CEMR LAB
Prolactin: 17 ng/mL (ref 2.1–17.1)
Testosterone-% Free: 2.1 % (ref 1.6–2.9)
Testosterone: 335.77 ng/dL (ref 250–890)

## 2010-07-19 ENCOUNTER — Telehealth: Payer: Self-pay | Admitting: Internal Medicine

## 2010-07-26 ENCOUNTER — Encounter: Admission: RE | Admit: 2010-07-26 | Discharge: 2010-07-26 | Payer: Self-pay | Admitting: Internal Medicine

## 2010-07-31 ENCOUNTER — Ambulatory Visit: Payer: Self-pay | Admitting: Internal Medicine

## 2010-07-31 DIAGNOSIS — M542 Cervicalgia: Secondary | ICD-10-CM | POA: Insufficient documentation

## 2010-08-01 ENCOUNTER — Telehealth: Payer: Self-pay | Admitting: Internal Medicine

## 2010-08-04 ENCOUNTER — Ambulatory Visit: Payer: Self-pay | Admitting: Endocrinology

## 2010-08-04 DIAGNOSIS — Z992 Dependence on renal dialysis: Secondary | ICD-10-CM

## 2010-08-04 DIAGNOSIS — N186 End stage renal disease: Secondary | ICD-10-CM

## 2010-08-04 HISTORY — DX: Dependence on renal dialysis: N18.6

## 2010-08-04 HISTORY — DX: Dependence on renal dialysis: Z99.2

## 2010-08-04 LAB — CONVERTED CEMR LAB: Free T4: 0.73 ng/dL (ref 0.60–1.60)

## 2010-08-11 ENCOUNTER — Encounter: Payer: Self-pay | Admitting: Internal Medicine

## 2010-08-12 ENCOUNTER — Emergency Department (HOSPITAL_COMMUNITY): Admission: EM | Admit: 2010-08-12 | Discharge: 2010-08-12 | Payer: Self-pay | Admitting: Emergency Medicine

## 2010-08-14 ENCOUNTER — Ambulatory Visit: Payer: Self-pay

## 2010-08-14 ENCOUNTER — Encounter: Payer: Self-pay | Admitting: Internal Medicine

## 2010-08-16 IMAGING — CR DG CHEST 2V
2 series · 2 of 2 positions shown · non-contrast
Comparison: 09/11/2009

CLINICAL DATA: Weakness

CHEST - 2 VIEW

[w chest pa]
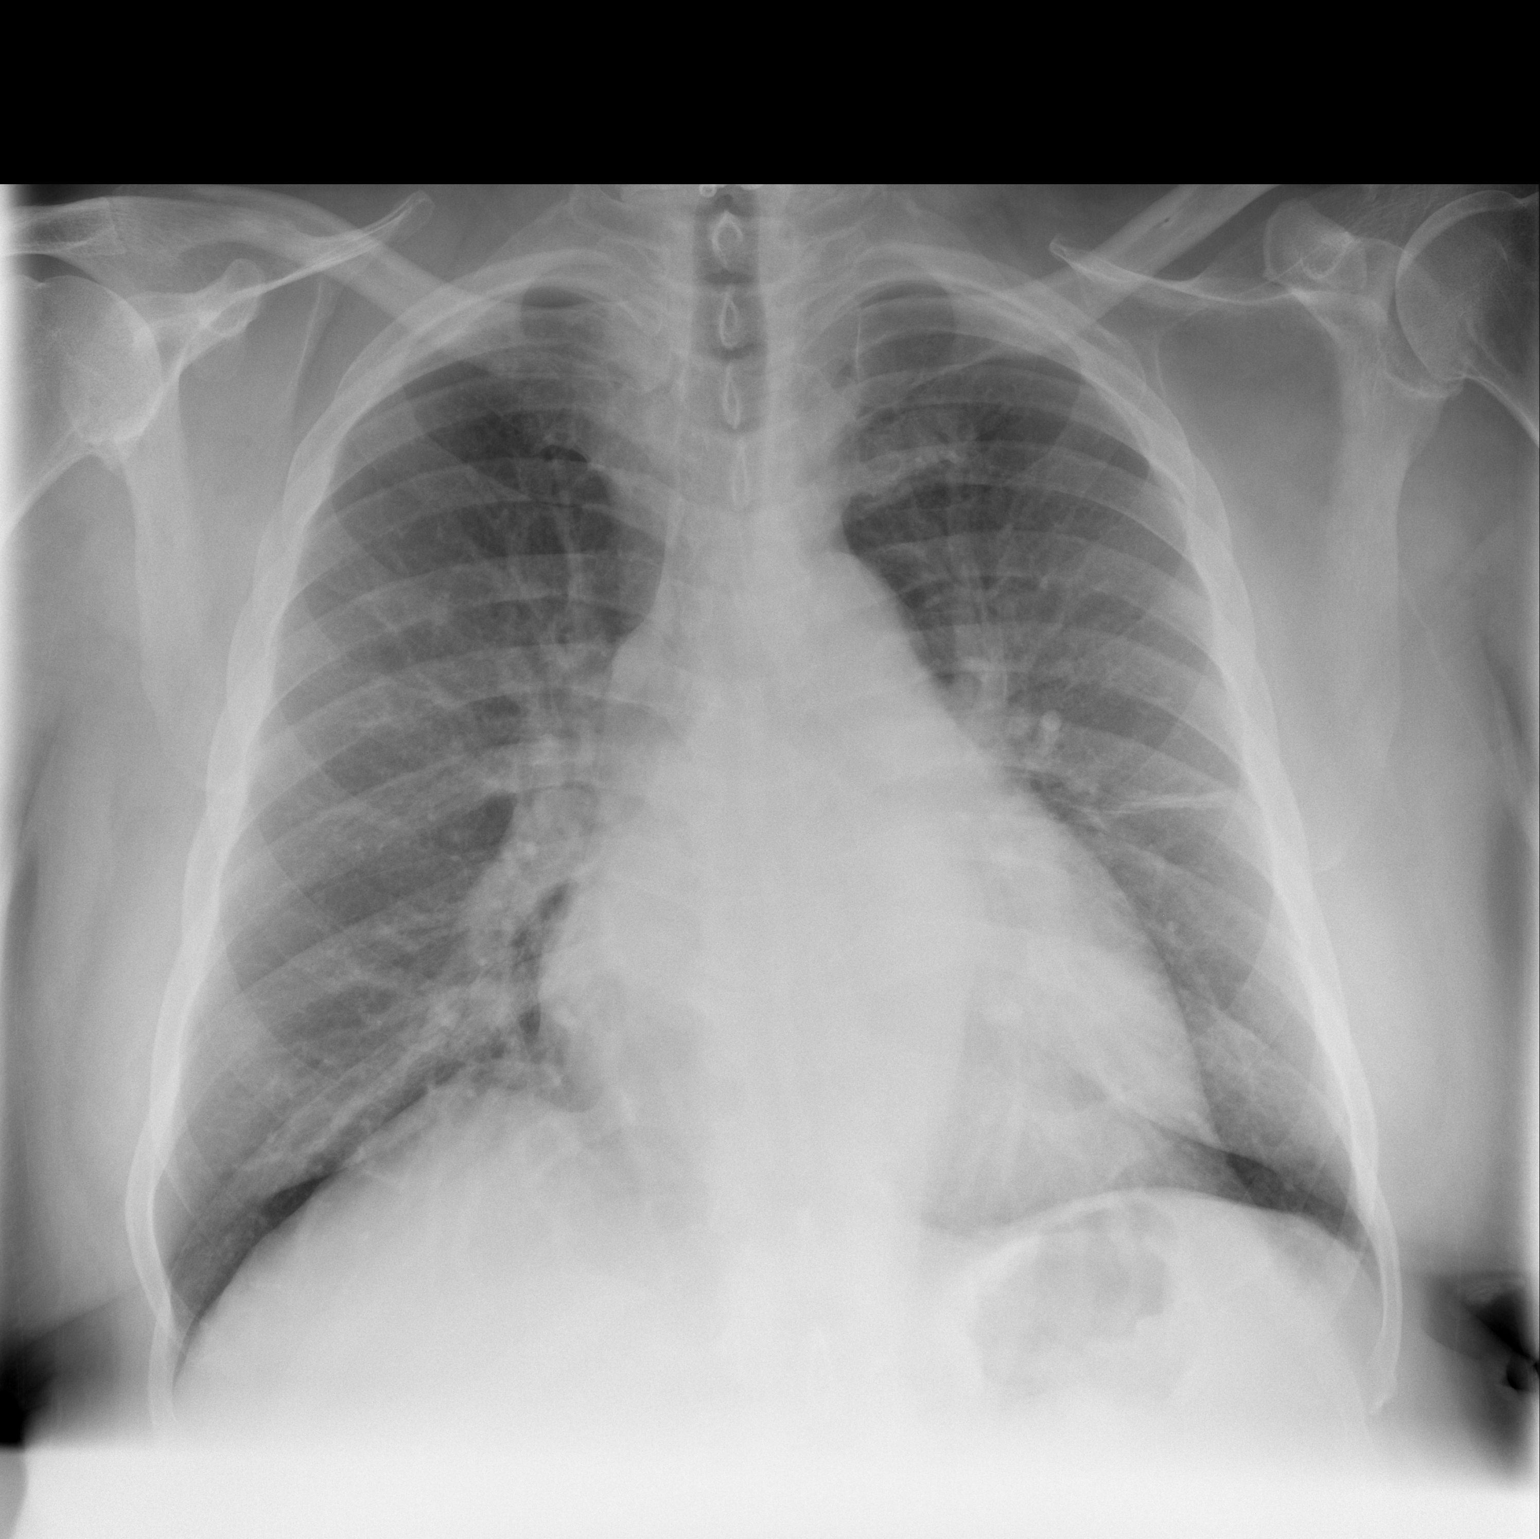

[w chest lat]
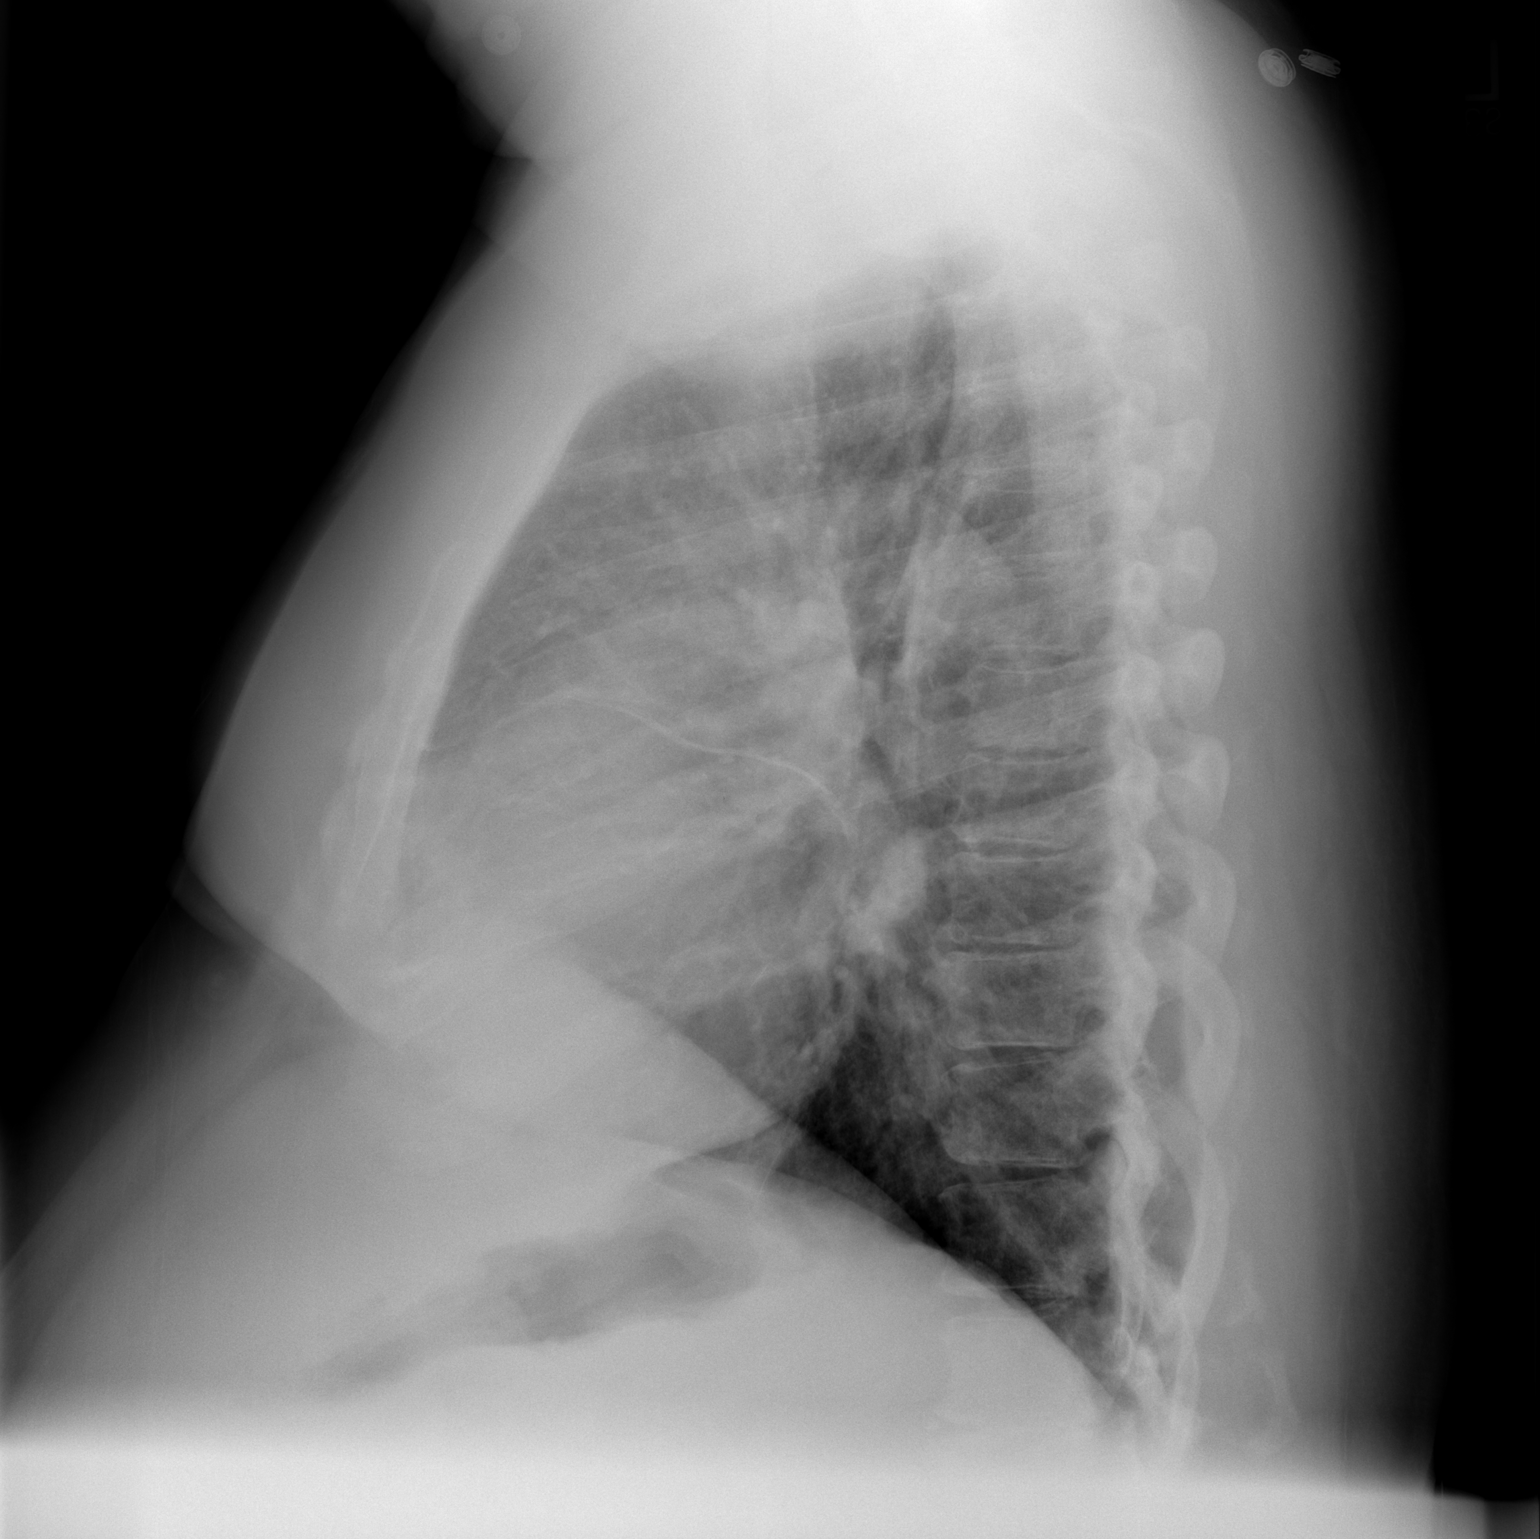

[2 of 2 positions shown; findings below may reference images not displayed]

FINDINGS: Borderline cardiomegaly again noted.  No acute infiltrate
or pleural effusion.  No pulmonary edema.  Linear atelectasis or
scarring in lingula is stable.  Mild degenerative changes lower
thoracic spine. Metallic fixation plate cervical spine again noted.
IMPRESSION: No acute infiltrate or edema.  Borderline cardiomegaly.  Linear
atelectasis or scarring in lingula is stable.

## 2010-08-16 IMAGING — CT CT HEAD W/O CM
1 of 2 series · 13 of 30 positions shown, 17 images · non-contrast
Comparison: 11/28/2007

CLINICAL DATA: Weakness.  Increased lethargy.  End-stage renal
disease.  Congestive heart failure.

CT HEAD WITHOUT CONTRAST
TECHNIQUE: Contiguous axial images were obtained from the base of
the skull through the vertex without contrast.

[Series 2: brain · axial · 0.47mm/px · z∈[+184,+309]mm · 13 of 28 slices shown, 17 images]
[im 2/28  brain]
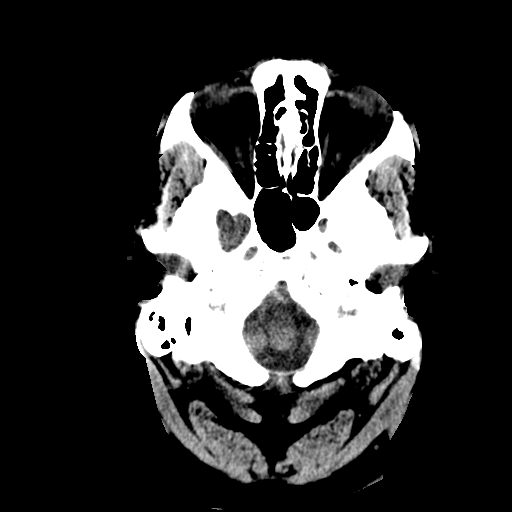
[im 2/28  bone]
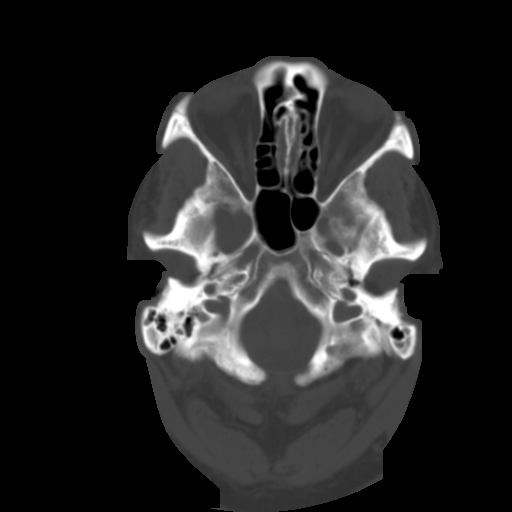
[im 4/28  brain]
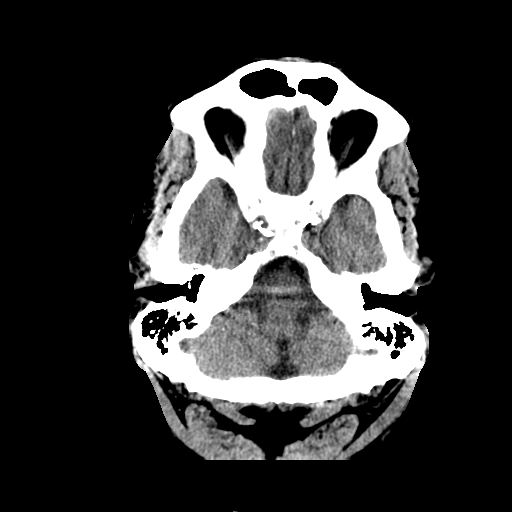
[im 6/28  brain]
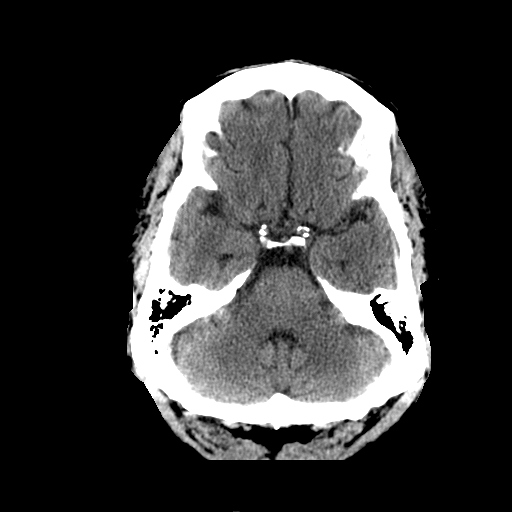
[im 8/28  brain]
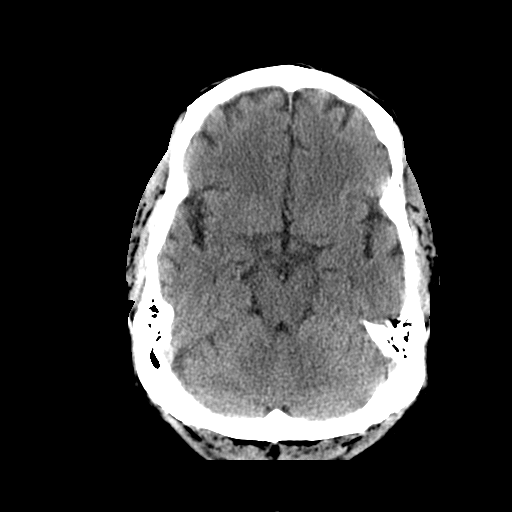
[im 10/28  brain]
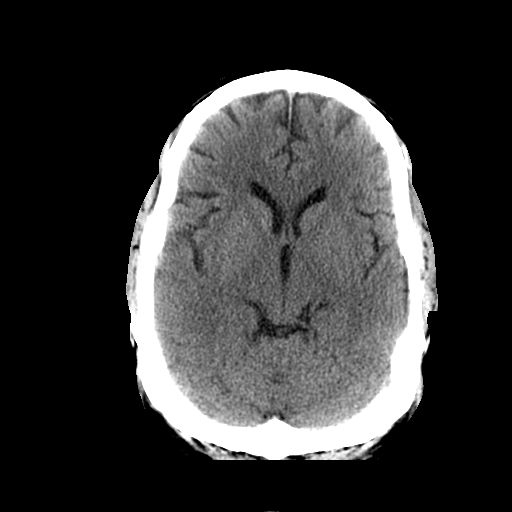
[im 10/28  bone]
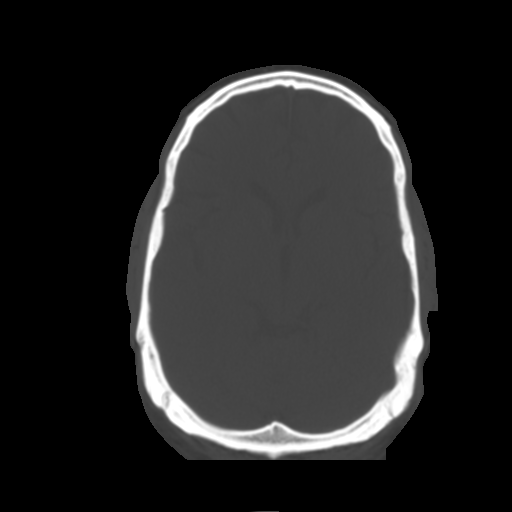
[im 12/28  brain]
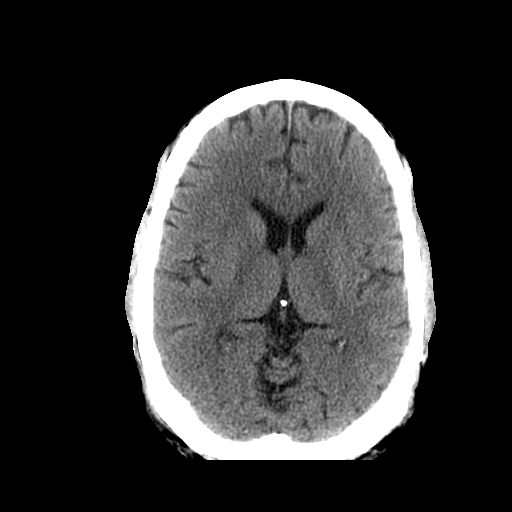
[im 14/28  brain]
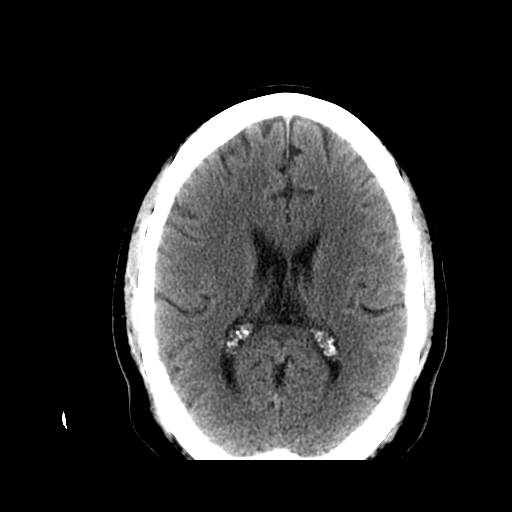
[im 16/28  brain]
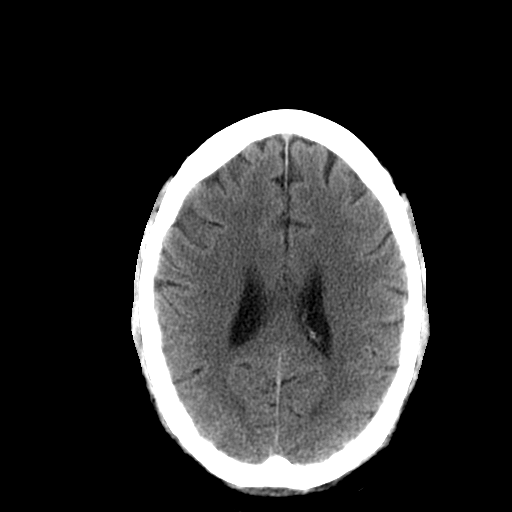
[im 18/28  brain]
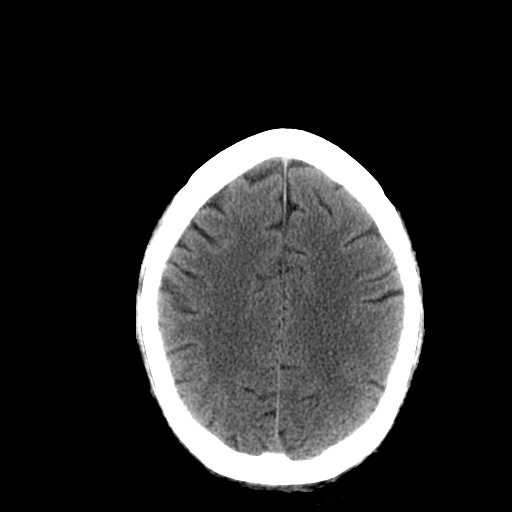
[im 18/28  bone]
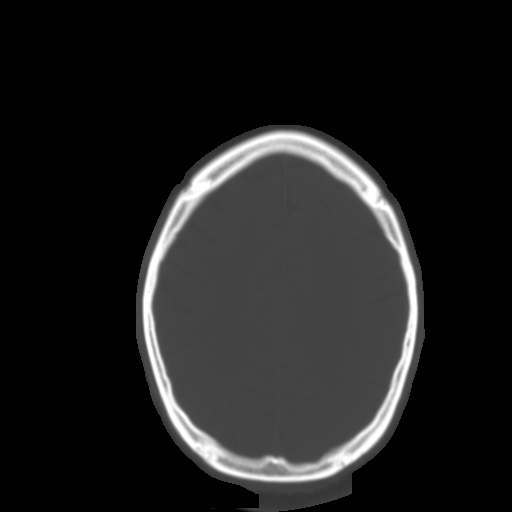
[im 20/28  brain]
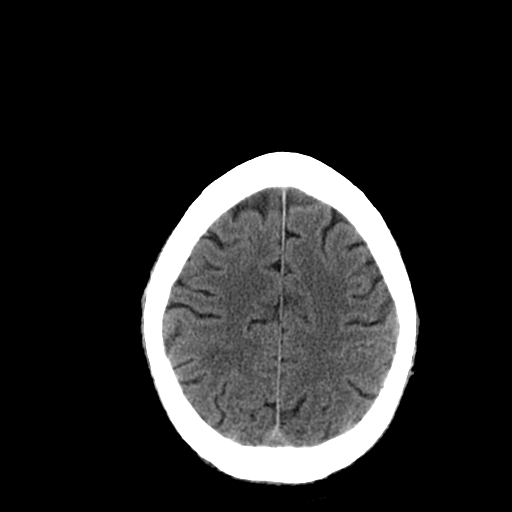
[im 22/28  brain]
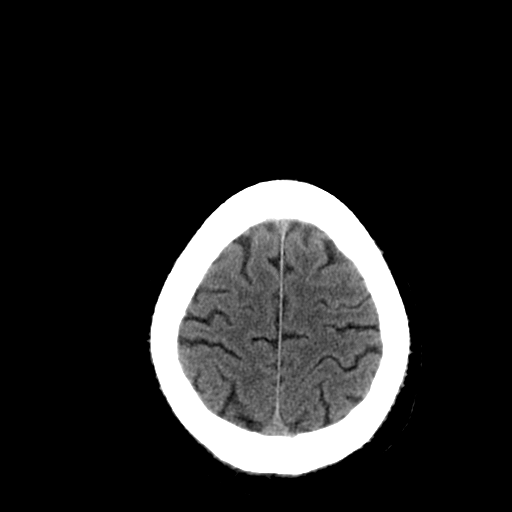
[im 24/28  brain]
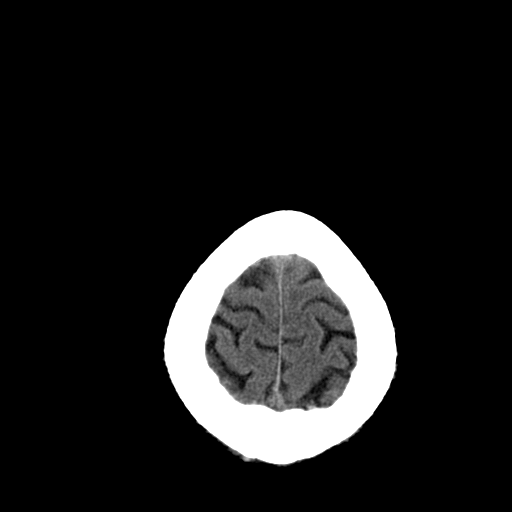
[im 26/28  brain]
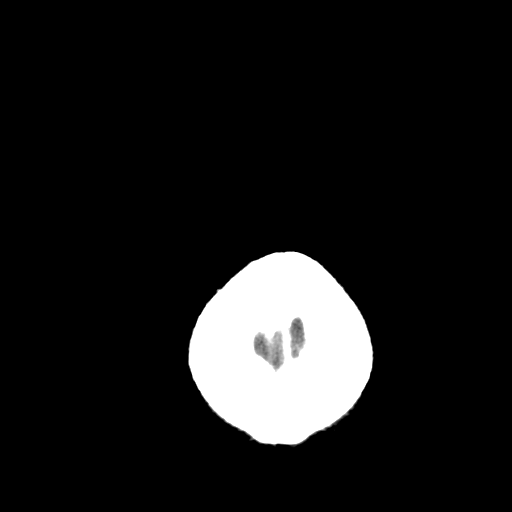
[im 26/28  bone]
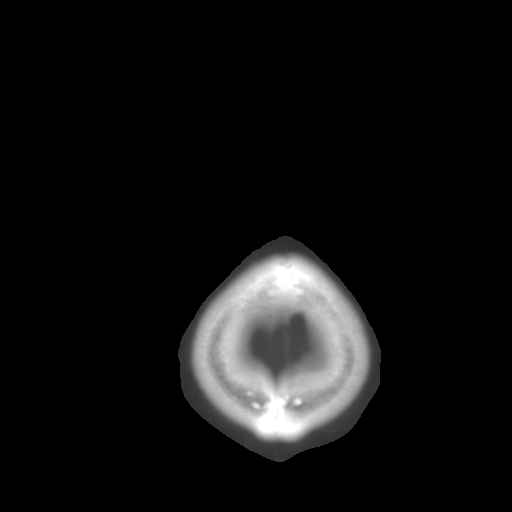

[13 of 30 positions shown; findings below may reference images not displayed]

FINDINGS: The brain stem, cerebellum, cerebral peduncles, thalami,
basal ganglia, basilar cisterns, and ventricular system appear
unremarkable.

No intracranial hemorrhage, mass lesion, or acute infarction is
identified.

There is mild chronic sphenoid and ethmoid sinusitis.
IMPRESSION: 1.  Mild chronic sphenoid and ethmoid sinusitis.
2.  Otherwise,  no significant abnormality identified.

## 2010-09-06 ENCOUNTER — Telehealth: Payer: Self-pay | Admitting: Internal Medicine

## 2010-09-11 ENCOUNTER — Telehealth: Payer: Self-pay | Admitting: Internal Medicine

## 2010-09-22 ENCOUNTER — Ambulatory Visit: Payer: Self-pay | Admitting: Vascular Surgery

## 2010-09-27 ENCOUNTER — Ambulatory Visit (HOSPITAL_COMMUNITY)
Admission: RE | Admit: 2010-09-27 | Discharge: 2010-09-27 | Payer: Self-pay | Source: Home / Self Care | Attending: Vascular Surgery | Admitting: Vascular Surgery

## 2010-09-29 ENCOUNTER — Ambulatory Visit: Payer: Self-pay | Admitting: Vascular Surgery

## 2010-10-09 ENCOUNTER — Ambulatory Visit (HOSPITAL_COMMUNITY)
Admission: RE | Admit: 2010-10-09 | Discharge: 2010-10-09 | Payer: Self-pay | Source: Home / Self Care | Attending: Vascular Surgery | Admitting: Vascular Surgery

## 2010-10-11 ENCOUNTER — Ambulatory Visit
Admission: RE | Admit: 2010-10-11 | Discharge: 2010-10-11 | Payer: Self-pay | Source: Home / Self Care | Attending: Vascular Surgery | Admitting: Vascular Surgery

## 2010-10-16 LAB — GLUCOSE, CAPILLARY: Glucose-Capillary: 100 mg/dL — ABNORMAL HIGH (ref 70–99)

## 2010-10-16 LAB — POCT I-STAT 4, (NA,K, GLUC, HGB,HCT)
Glucose, Bld: 113 mg/dL — ABNORMAL HIGH (ref 70–99)
HCT: 32 % — ABNORMAL LOW (ref 39.0–52.0)
Hemoglobin: 10.9 g/dL — ABNORMAL LOW (ref 13.0–17.0)
Potassium: 5.1 mEq/L (ref 3.5–5.1)
Sodium: 140 mEq/L (ref 135–145)

## 2010-10-16 LAB — SURGICAL PCR SCREEN
MRSA, PCR: NEGATIVE
Staphylococcus aureus: NEGATIVE

## 2010-10-18 IMAGING — CR DG CHEST 1V PORT
1 series · 1 of 1 positions shown · non-contrast
Comparison: 10/25/2009.

CLINICAL DATA: Fever.  Hypertension.  Diabetes.

PORTABLE CHEST - 1 VIEW

[AP]
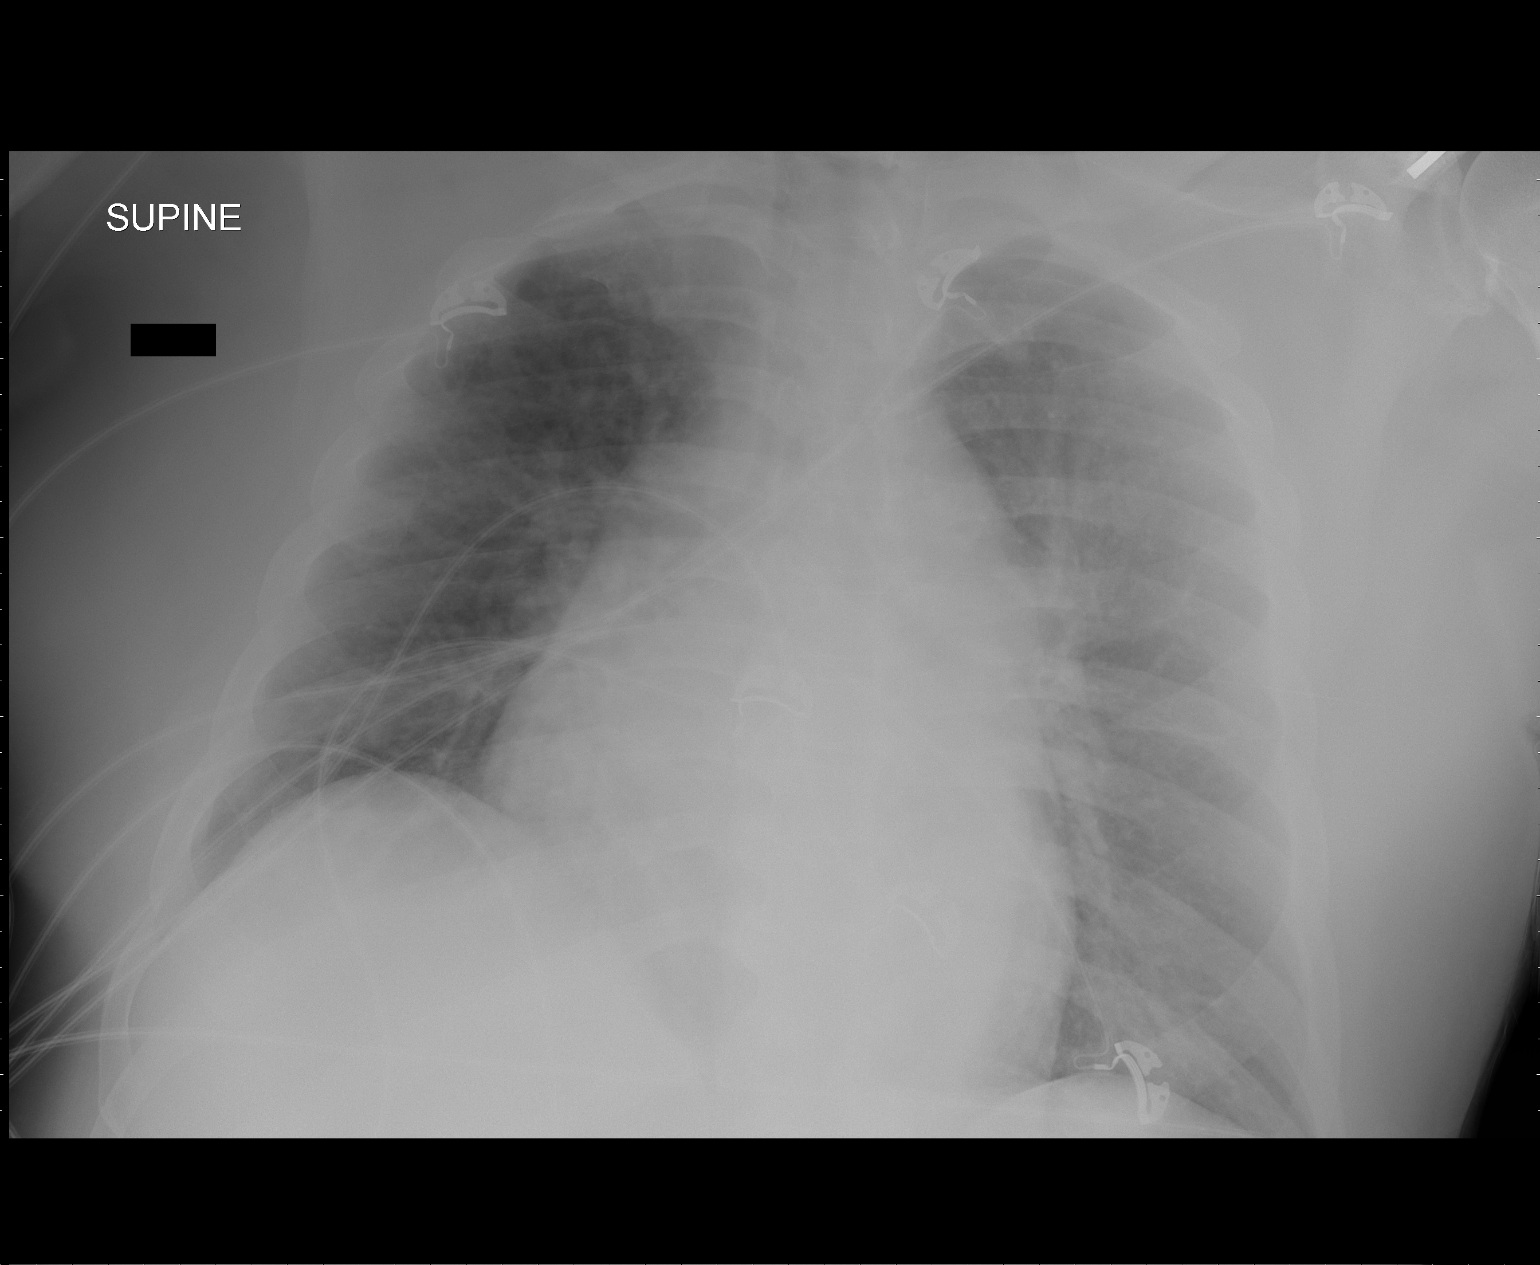

[1 of 1 positions shown; findings below may reference images not displayed]

FINDINGS: Enlarged cardiac silhouette with a mild increase in size,
magnified by a decreased inspiration and the portable AP technique.
Mild increase in prominence of the pulmonary vasculature and
interstitial markings, also accentuated by decreased inspiration.
No pleural fluid.  Thoracic spine degenerative changes.  Cervical
spine fixation hardware.  A right lower cervical spine fixation
hardware screw appears anteriorly positioned.
IMPRESSION: Interval mild cardiomegaly, pulmonary vascular congestion and
possible mild interstitial pulmonary edema, all magnified by the
decreased inspiration.

## 2010-10-19 IMAGING — US US ABDOMEN COMPLETE
1 series · 13 of 25 positions shown · non-contrast
Comparison: Renal ultrasound 07/21/2008.  Abdominal ultrasound of
04/01/2006.

CLINICAL DATA: Fever.

COMPLETE ABDOMINAL ULTRASOUND

[Series 1: us abdomen complete · 0.30mm/px · 13 of 49 slices shown]
[im 1/49]
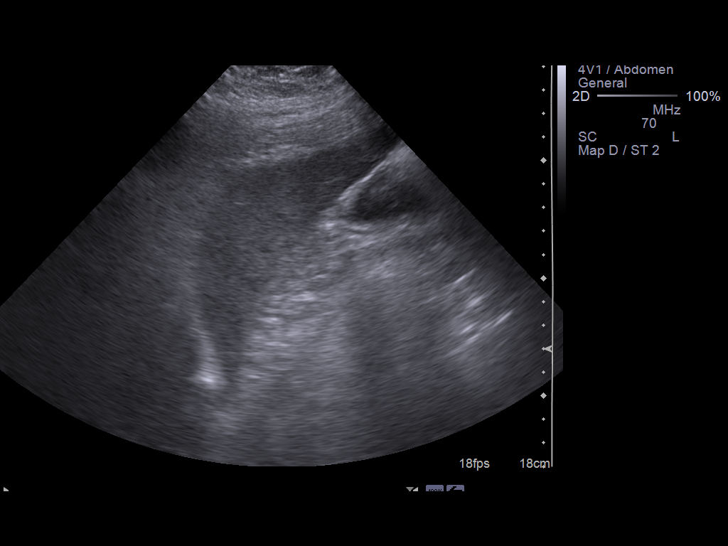
[im 5/49]
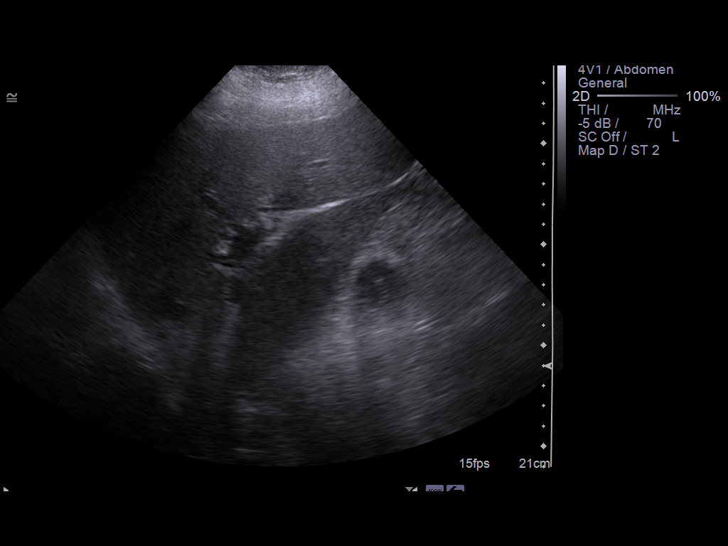
[im 9/49]
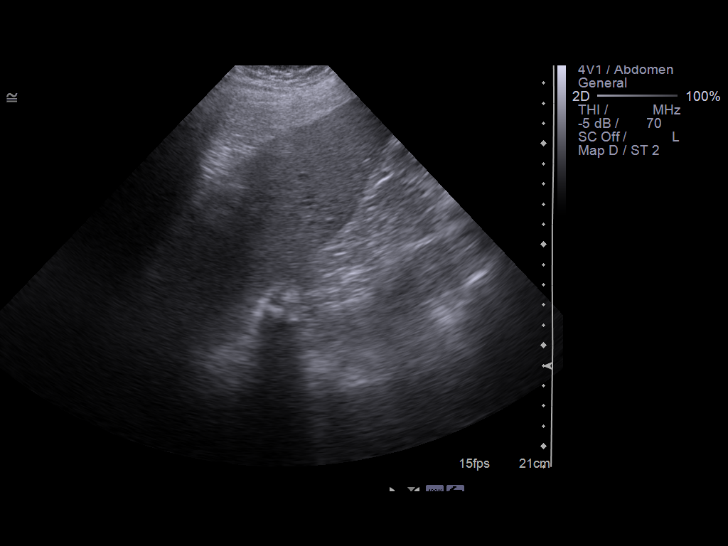
[im 13/49]
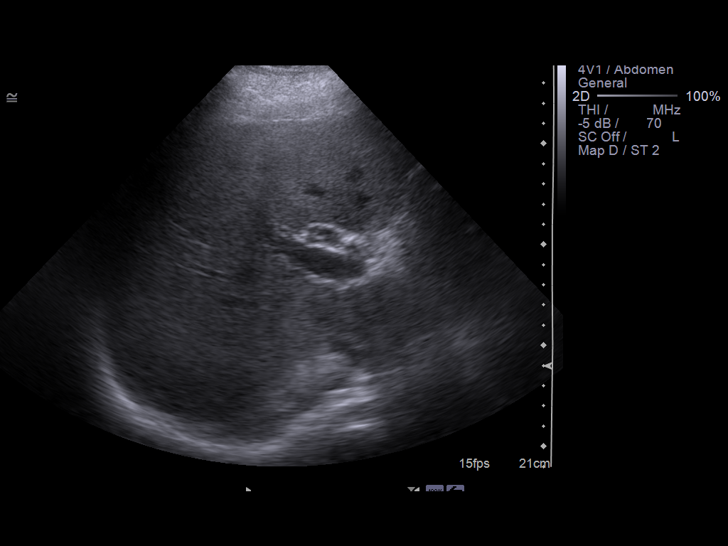
[im 17/49]
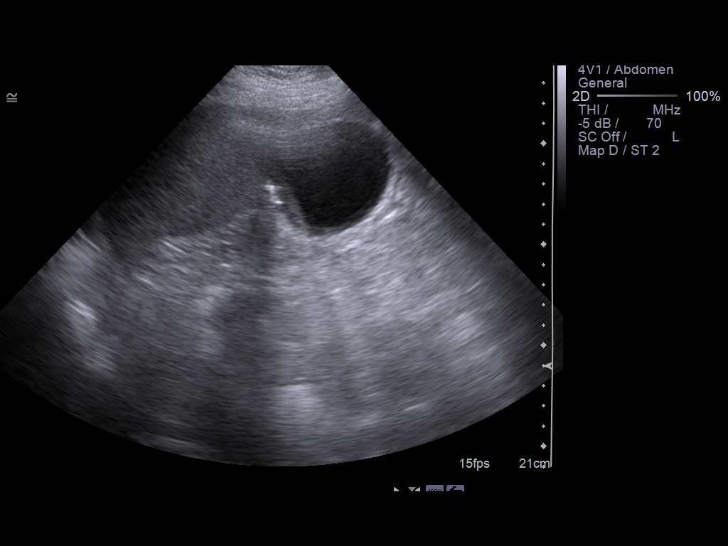
[im 21/49]
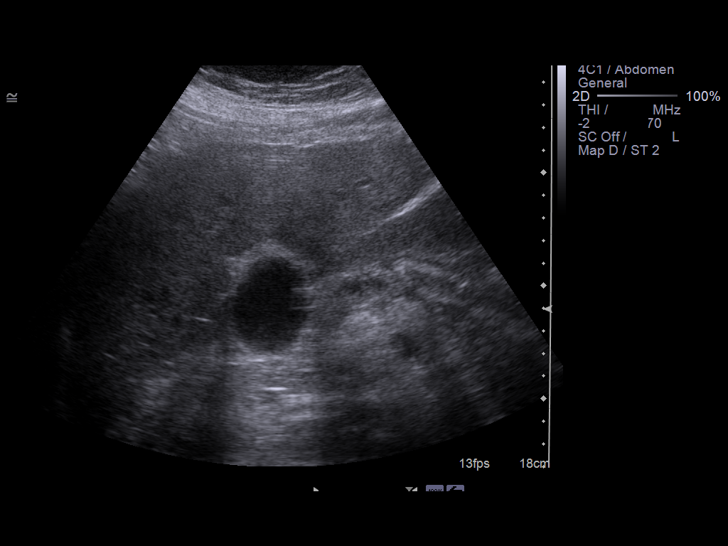
[im 25/49]
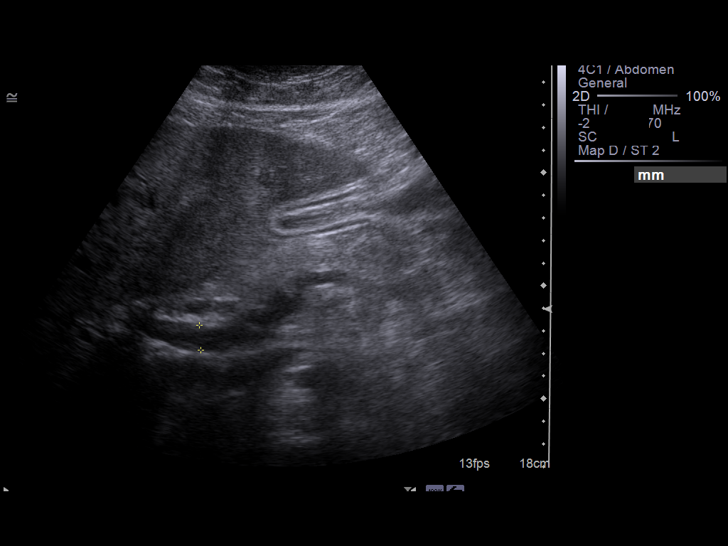
[im 29/49]
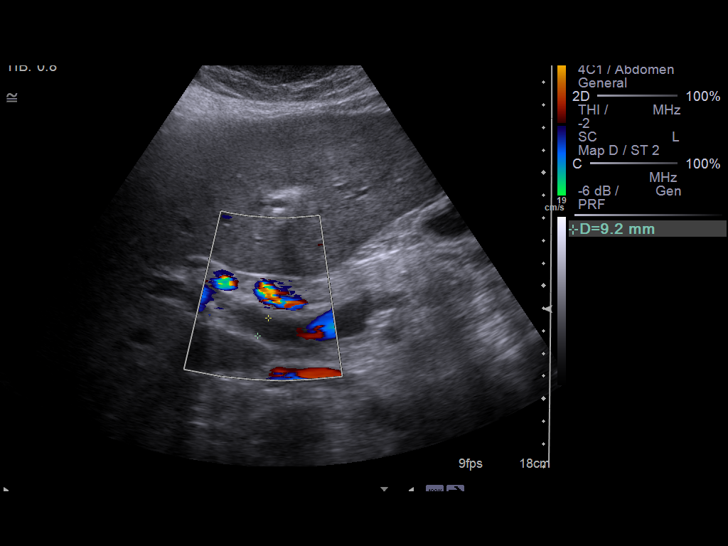
[im 33/49]
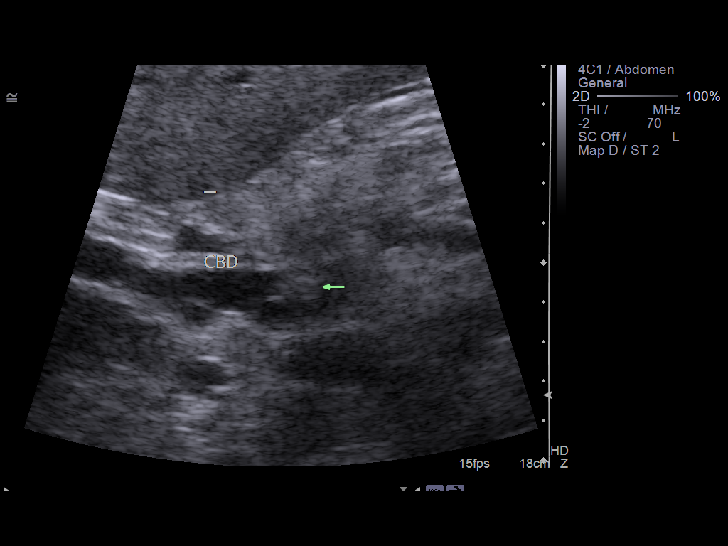
[im 37/49]
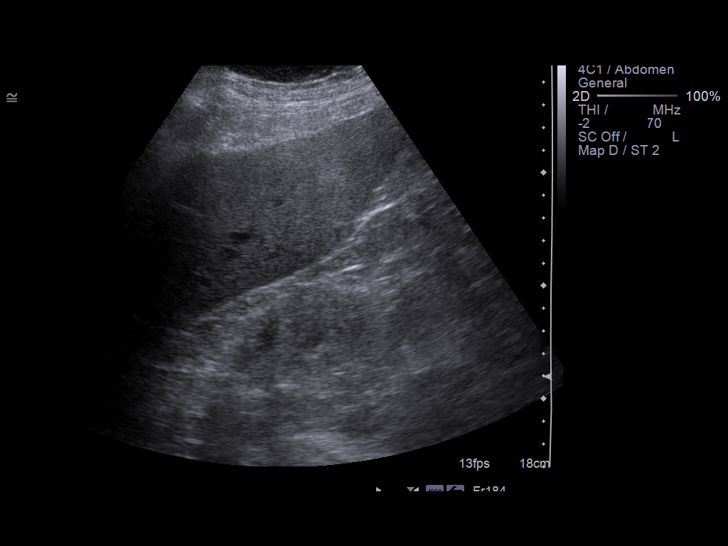
[im 41/49]
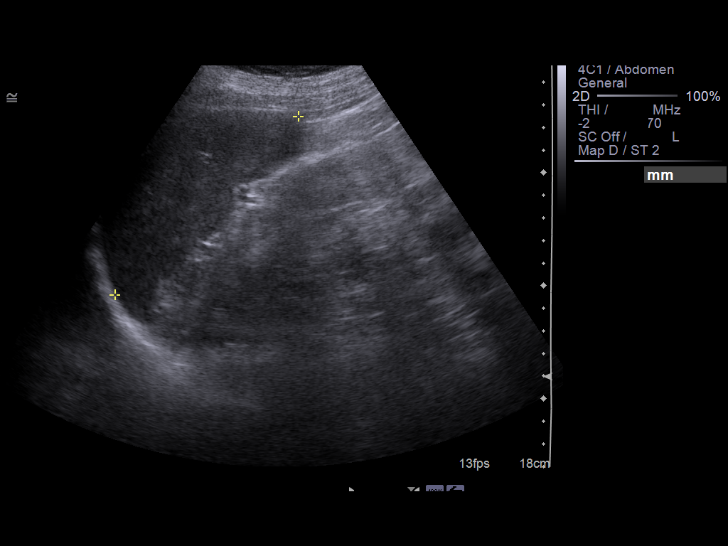
[im 45/49]
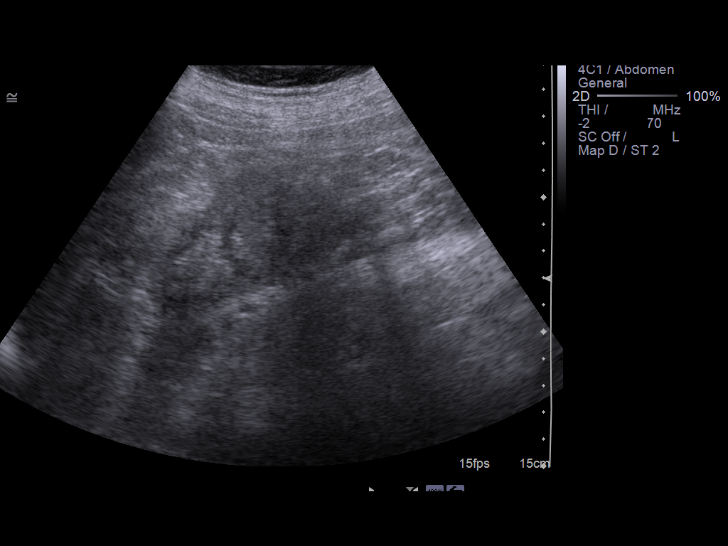
[im 49/49]
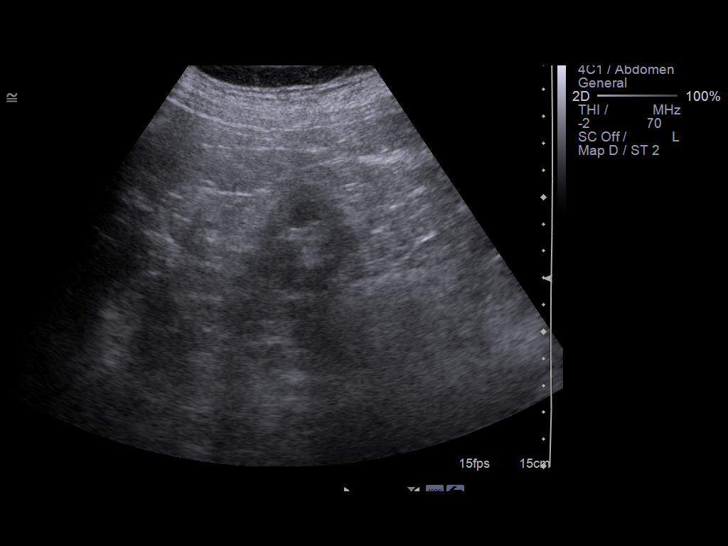

[13 of 25 positions shown; findings below may reference images not displayed]

FINDINGS: Gallbladder:  Gallbladder sludge.  Small stones.  Gallbladder
distention, maximally 12.3 cm.  No wall thickening or
pericholecystic fluid. Sonographic Murphy's sign was not elicited.

Common bile duct: Moderately dilated.  Measures 1.1 cm proximally
and tapers in the region of the porta hepatis to 9 mm.  A distal
common duct stone of 7 mm cannot be excluded on image 37.

Liver: Probable mild fatty infiltration.  Mild intrahepatic biliary
ductal dilatation.

IVC: Negative

Pancreas:  Poorly visualized due to overlying bowel gas.

Spleen:  Normal in size and echogenicity.

Right Kidney:  9.9 cm.  Mild renal cortical thinning and
hyperechogenicity. No hydronephrosis.

Left Kidney:  9.9 cm.  Mild renal cortical thinning and
hyperechogenicity. No hydronephrosis.

Abdominal aorta:  Nonaneurysmal without ascites.
IMPRESSION: 1.  Biliary ductal dilatation.  Cannot exclude distal common duct
stone.  The most sensitive exam to confirm this finding would be
MRCP or ERCP.  If more urgent imaging evaluation for confirmation
is desired, contrast enhanced CT should be considered.
2.  Gallbladder distention with stones and sludge.  No specific
evidence of acute cholecystitis.
3.  Medical renal disease within the kidneys.
4.  Suspect mild fatty infiltration of the liver.

## 2010-10-21 IMAGING — MR MR MRCP
7 of 11 series · 24 of 48 positions shown · non-contrast
Comparison: Abdominal ultrasound 12/28/2009

CLINICAL DATA: Sepsis.  Abdominal pain.  Common bile duct
dilatation.  Evaluate for obstruction.

MRI ABDOMEN WITHOUT CONTRAST (MRCP)
TECHNIQUE: Multiplanar multisequence MR imaging of the abdomen was
performed, including heavily T2-weighted images of the biliary and
pancreatic ducts.  Three-dimensional MR images were rendered by
post processing of the original MR data.

[Series 3: T2 fat-sat · axial · 5.0mm · 0.82mm/px · z∈[-76,+176]mm · 3 of 43 slices shown (1 of 2)]
[im 1/43]
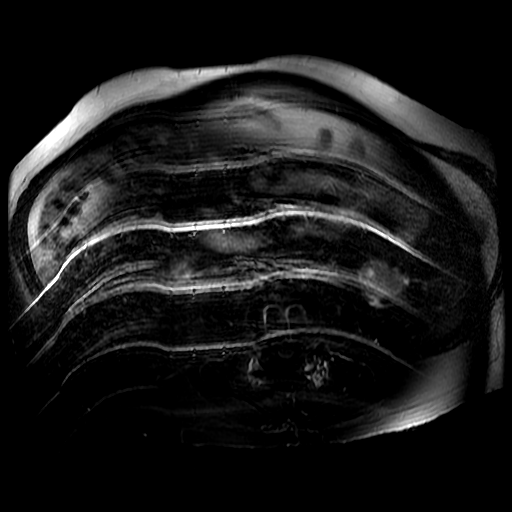
[im 22/43]
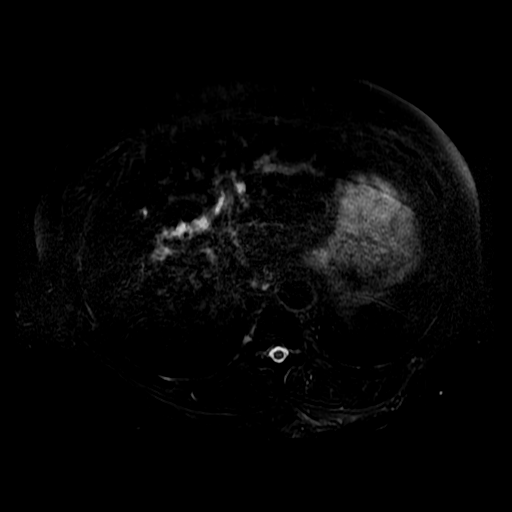
[im 43/43]
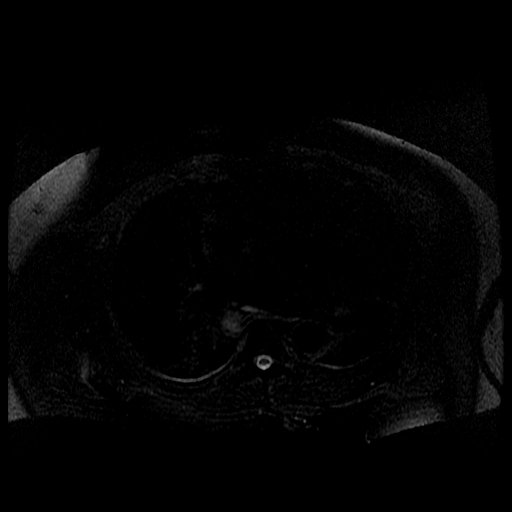

[Series 4: T2 fat-sat · coronal · 5.0mm · 0.86mm/px · 3 of 33 slices shown (2 of 2)]
[im 1/33]
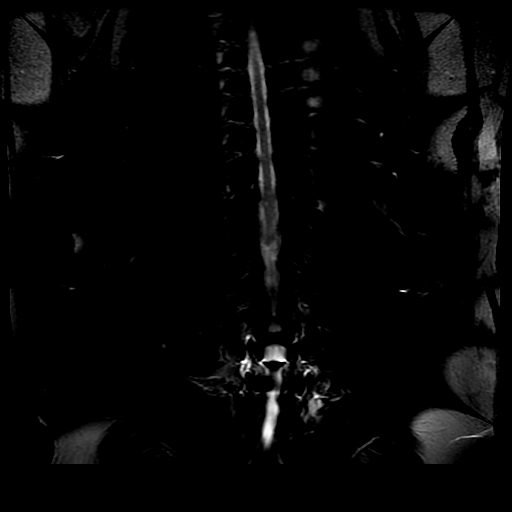
[im 17/33]
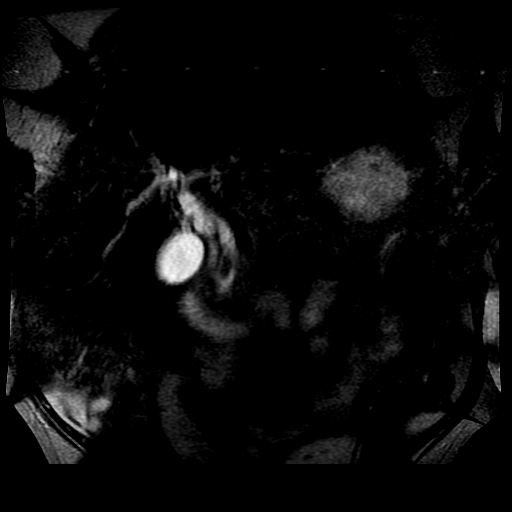
[im 33/33]
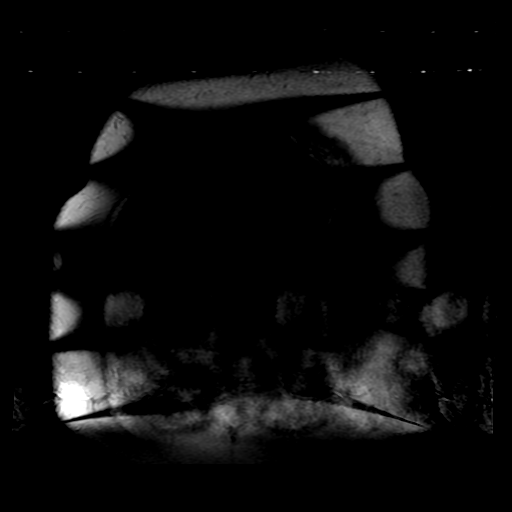

[Series 5: MRCP · axial · 4.0mm · 0.84mm/px · z∈[-54,+98]mm · 3 of 39 slices shown (1 of 2)]
[im 1/39]
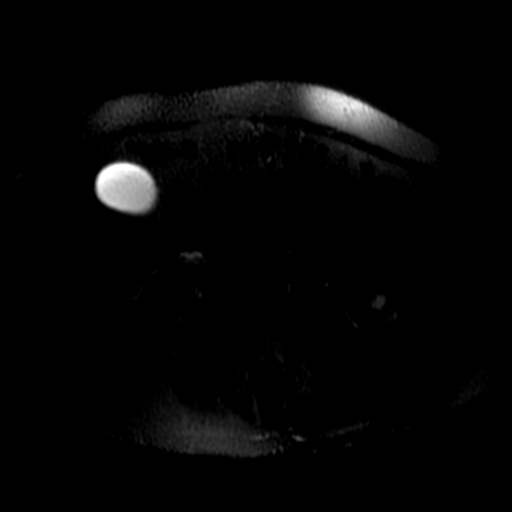
[im 20/39]
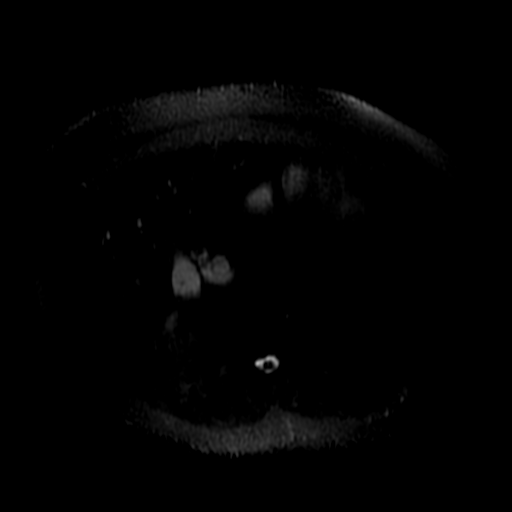
[im 39/39]
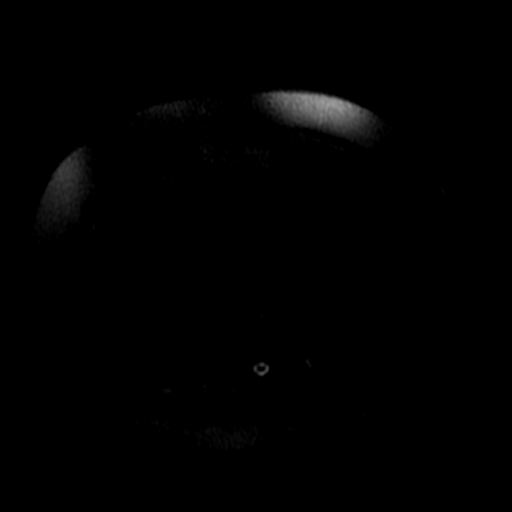

[Series 7: DWI b500 · axial · 5.0mm · 1.56mm/px · z∈[-86,+148]mm · 7 of 80 slices shown]
[im 1/80]
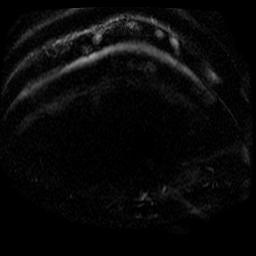
[im 14/80]
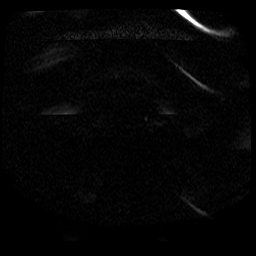
[im 27/80]
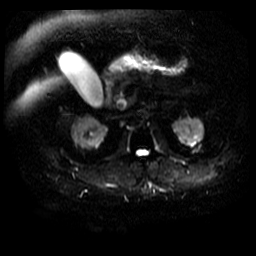
[im 40/80]
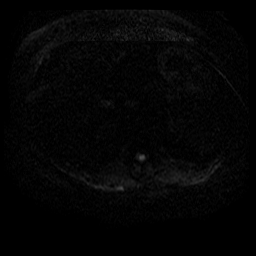
[im 53/80]
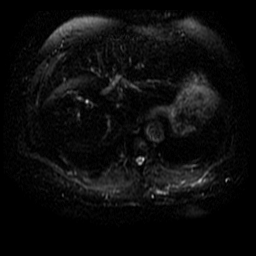
[im 66/80]
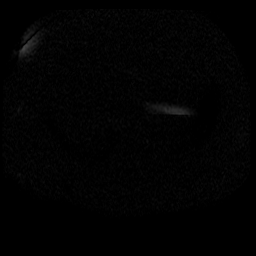
[im 80/80]
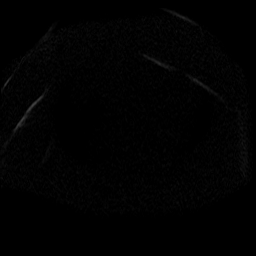

[Series 10: MRCP · sagittal · 70.0mm · 0.78mm/px · 1 of 4 slices shown (2 of 2)]
[im 1/4]
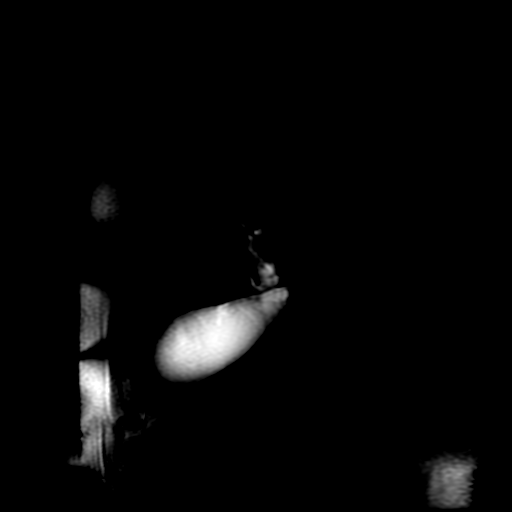

[Series 11: ax dualecho fspgr · axial · 5.0mm · 0.88mm/px · z∈[-80,+82]mm · 5 of 56 slices shown]
[im 1/56]
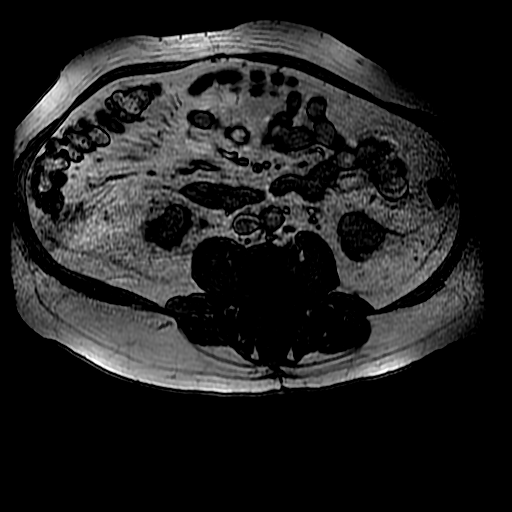
[im 14/56]
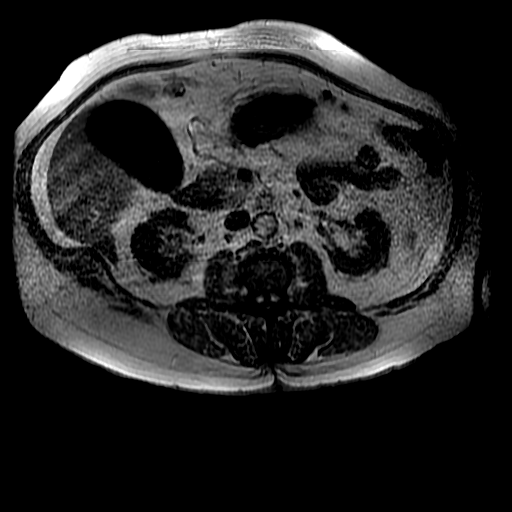
[im 28/56]
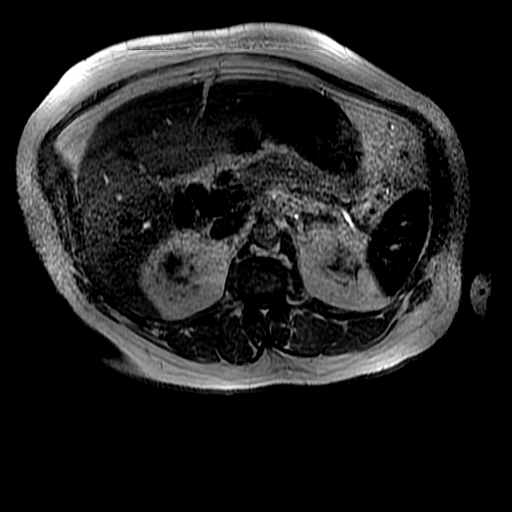
[im 42/56]
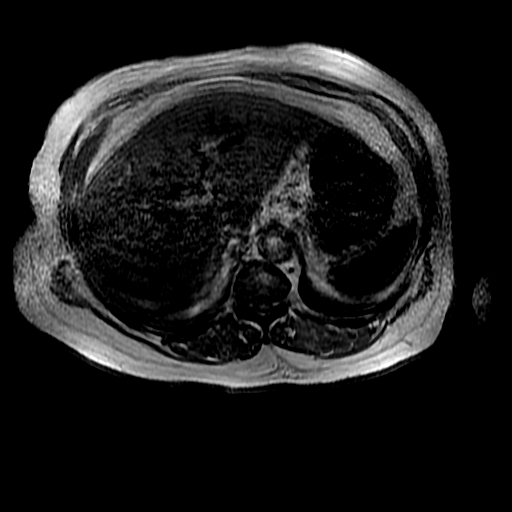
[im 56/56]
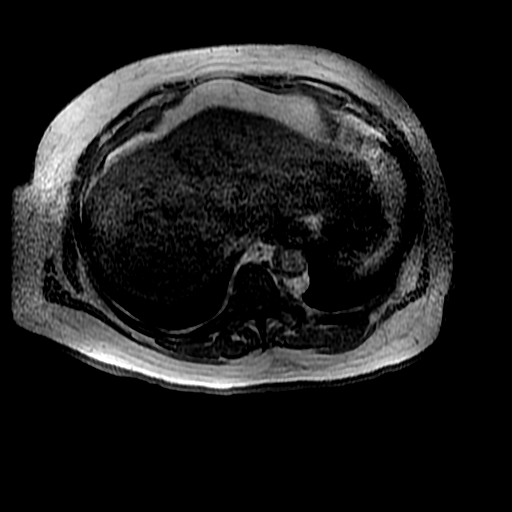

[Series 12: T1 dynamic · axial · 5.0mm · 0.78mm/px · z∈[-83,-50]mm · 2 of 92 slices shown]
[im 1/92]
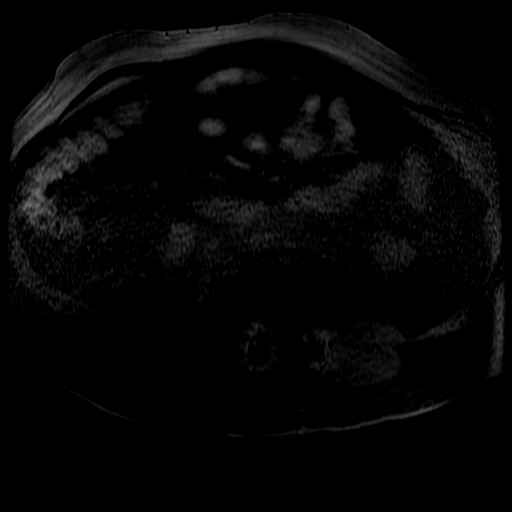
[im 14/92]
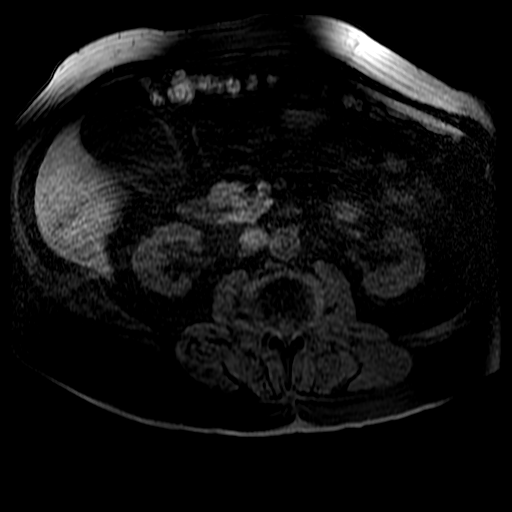

[24 of 48 positions shown; findings below may reference images not displayed]

FINDINGS: The examination is quite limited due to patient motion.
The patient would not or could not hold his breath.  However, there
are clearly two large distal common bile duct stones likely
responsible for the intra and extrahepatic biliary dilatation.
Small gallstones are also noted in the gallbladder.  The pancreatic
duct is not dilated.  The pancreas, spleen and kidneys are grossly
normal.
IMPRESSION: 1.  Very limited examination due to respiratory motion.
2.  Distal common bile duct stones are identified and are likely
responsible for the intra and extrahepatic biliary dilatation.
3.  Small gallstones.

## 2010-10-22 ENCOUNTER — Encounter: Payer: Self-pay | Admitting: Nephrology

## 2010-10-22 ENCOUNTER — Encounter: Payer: Self-pay | Admitting: Endocrinology

## 2010-10-22 ENCOUNTER — Encounter: Payer: Self-pay | Admitting: Neurosurgery

## 2010-10-25 ENCOUNTER — Encounter: Payer: Self-pay | Admitting: Endocrinology

## 2010-10-28 IMAGING — RF DG CHOLANGIOGRAM OPERATIVE
1 series · 2 of 2 positions shown · non-contrast
Comparison: ERCP 01/05/2010 and earlier studies

CLINICAL DATA: Choledocholithiasis

INTRAOPERATIVE CHOLANGIOGRAM
TECHNIQUE: Cholangiographic images from the C-arm fluoroscopic
device were submitted for interpretation post-operatively.  Please
see the procedural report for the amount of contrast and the
fluoroscopy time utilized.

[Series 1: run · 2 of 2 slices shown]
[im 1/2]
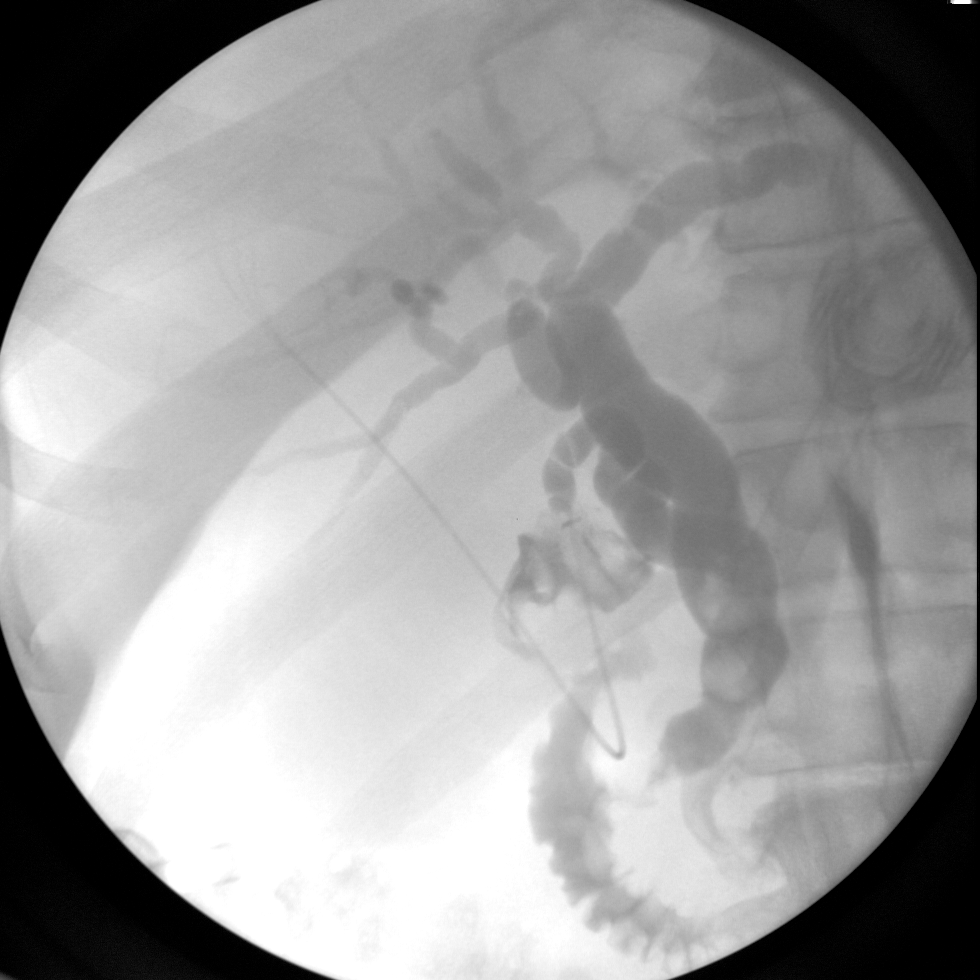
[im 2/2]
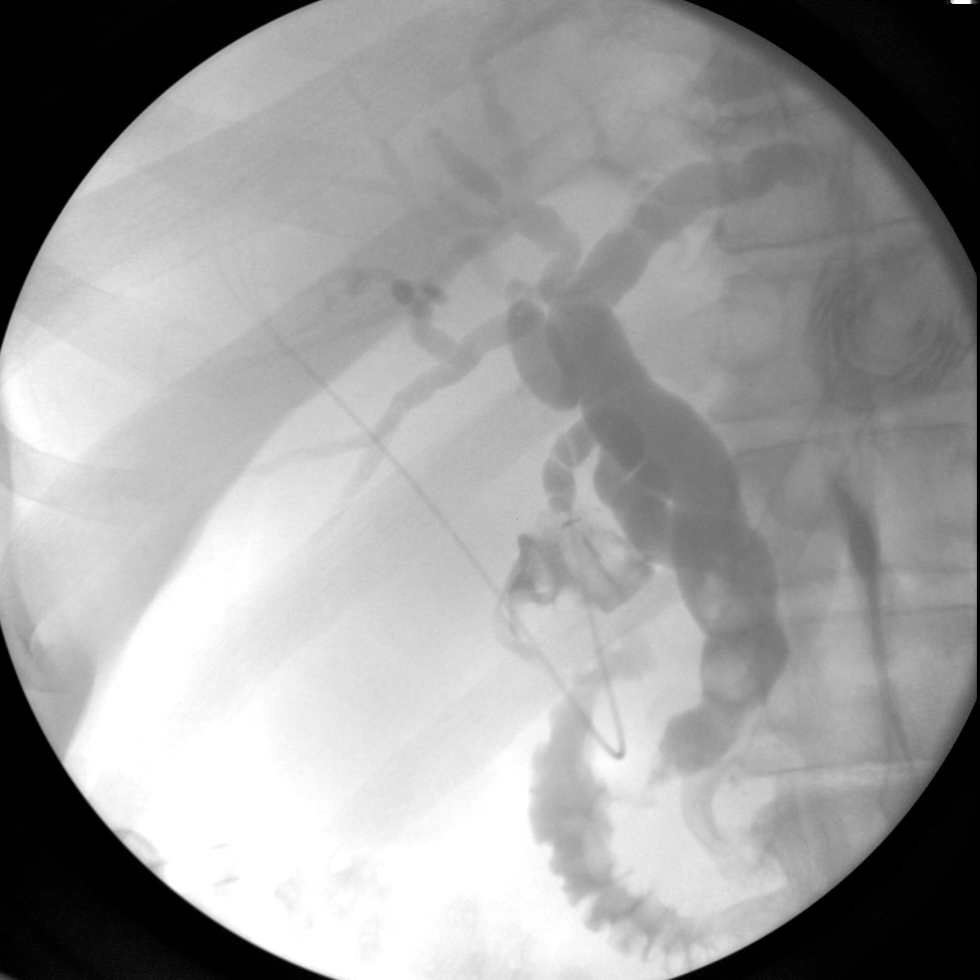

[2 of 2 positions shown; findings below may reference images not displayed]

FINDINGS: The common bile duct is mildly dilated.  2 persistent
filling defects in the distal common duct. Intrahepatic ducts are
incompletely visualized, appearing mildly distended centrally.
Contrast passes into the duodenum.

IMPRESSION

Suspect at least 2 retained common duct stones

## 2010-10-29 LAB — CONVERTED CEMR LAB
BUN: 26 mg/dL — ABNORMAL HIGH (ref 6–23)
BUN: 55 mg/dL — ABNORMAL HIGH (ref 6–23)
CO2: 29 meq/L (ref 19–32)
CO2: 32 meq/L (ref 19–32)
Calcium: 10.2 mg/dL (ref 8.4–10.5)
Chloride: 101 meq/L (ref 96–112)
Creatinine, Ser: 4.8 mg/dL (ref 0.4–1.5)
Creatinine, Ser: 8.1 mg/dL (ref 0.4–1.5)
GFR calc non Af Amer: 8.77 mL/min (ref >60)
Glucose, Bld: 218 mg/dL — ABNORMAL HIGH (ref 70–99)
Glucose, Bld: 263 mg/dL — ABNORMAL HIGH (ref 70–99)
HCT: 35 % — ABNORMAL LOW (ref 39.0–52.0)
Hemoglobin: 13.2 g/dL (ref 13.0–17.0)
INR: 1 (ref 0.8–1.0)
MCHC: 32.1 g/dL (ref 30.0–36.0)
MCHC: 32.3 g/dL (ref 30.0–36.0)
MCV: 96.9 fL (ref 78.0–100.0)
Neutrophils Relative %: 52 % (ref 43.0–77.0)
Platelets: 252 10*3/uL (ref 150.0–400.0)
Platelets: 325 10*3/uL (ref 150.0–400.0)
Potassium: 4.2 meq/L (ref 3.5–5.1)
Prothrombin Time: 10.4 s — ABNORMAL LOW (ref 10.9–13.3)
Prothrombin Time: 9.4 s (ref 9.1–11.7)
RDW: 21.8 % — ABNORMAL HIGH (ref 11.5–14.6)
Sodium: 144 meq/L (ref 135–145)
WBC: 5.7 10*3/uL (ref 4.5–10.5)

## 2010-10-31 NOTE — Assessment & Plan Note (Signed)
Summary: HYPERTHY--PER FLAG--PER DR JWJ--STC   Vital Signs:  Patient profile:   60 year old male Height:      73 inches (185.42 cm) Weight:      237.25 pounds (107.84 kg) O2 Sat:      95 % on Room air Temp:     97.6 degrees F (36.44 degrees C) oral Pulse rate:   90 / minute BP sitting:   122 / 60  (left arm) Cuff size:   large  Vitals Entered By: Gardenia Phlegm RMA (Feb 06, 2010 1:18 PM)  O2 Flow:  Room air CC: Hyperthyroid/ pts spouse states Dr [name] took pt off of Amlodipine Besylate last week/ CF   Referring Provider:  Dr Wallene Huh Primary Provider:  Biagio Borg MD  CC:  Hyperthyroid/ pts spouse states Dr [name] took pt off of Amlodipine Besylate last week/ CF.  History of Present Illness: the status of at least 3 ongoing medical problems is addressed today: hyperthyroidism:  pt states he feels well in general, exept for fatigue.  he lost weight during his recent hospitalization.  goiter:  he does not notice this dm:  he brings a record of his cbg's which i have reviewed today.     Current Medications (verified): 1)  Allopurinol 300 Mg Tabs (Allopurinol) .... 2 By Mouth Daily 2)  Adult Aspirin Low Strength 81 Mg  Tbdp (Aspirin) .... Take 1 By Mouth Qd 3)  Plavix 75 Mg  Tabs (Clopidogrel Bisulfate) .... Take 1 By Mouth Qd 4)  Isosorbide Mononitrate Cr 60 Mg Xr24h-Tab (Isosorbide Mononitrate) .... Take 1 Tablet By Mouth Once A Day 5)  Celexa 10 Mg Tabs (Citalopram Hydrobromide) .[name].. 1 Tab Once Daily 6)  Rena-Vite  Tabs (B Complex-C-Folic Acid) .... Take 1 Tablet By Mouth Once A Day 7)  Coreg 25 Mg Tabs (Carvedilol) .... Take 1 Tablet By Mouth Two Times A Day 8)  Amlodipine Besylate 5 Mg Tabs (Amlodipine Besylate) .[name].. 1po Once Daily 9)  Humulin 70/30 70-30 % Susp (Insulin Isophane & Regular) .... 40 Units Qam 10 Units Qpm 10)  A1c 250.41 11)  Gabapentin 100 Mg Caps (Gabapentin) .... 2 Caps At Bedtime 12)  Epogen 3000 Unit/ml Soln (Epoetin Alfa) .... At Dialysis 13)   Oscal 500/200 D-3 500-200 Mg-Unit Tabs (Calcium-Vitamin D) .... 2 By Mouth 3 Times A Week 14)  Ranitidine Hcl 300 Mg Caps (Ranitidine Hcl) .[name].. 1 By Mouth Daily  Allergies (verified): 1)  ! Keflex 2)  ! * Statins  Past History:  Past Medical History: Last updated: 02/01/2010  1. Morbid obesity  2. Diabetes  3. CHF, primarily diastolic      --EF previously 37%      --last echo. 55% with mild TR      --CPX: VO2 12.0. slope of 45. Corrected to ideal body weight VO2 = 18.0      --[facility] 2/11: RA 7 RV 36/10/13 PA 26/16 (22) pcwp 2 PA sats 55% and 58% Fick 5.6/2.3  4. ESRD  - dialysis dec 2009  5. OSA on CPAP  6. COPD on nightime O2  7. Myositis with a sed rate of 100       -statin stopped  8. Hyperlipidemia  9.Hypertension 10.Gout 12. Diabetes     --c/b peripheral neurpoathy 13. GERD 14. Elevated R hemidiaphragm 15. Chronic dyspnea, multifactorial  ROUTINE GENERAL MEDICAL [email or web address]@[email or web address]  CARE FACL (ICD-V70.0) SPECIAL SCREENING MALIGNANT NEOPLASM OF PROSTATE (ICD-V76.44) FOOT PAIN (ICD-729.5) GOITER, MULTINODULAR (ICD-241.1) ONYCHOMYCOSIS, TOENAILS (ICD-110.1) Benign  prostatic hypertrophy Depression GERD cervical stenosis Colonic polyps, hx of Bipolar disease Gout Dementia Left knee djd - severe, end stage Diabetes mellitus, type II  Review of Systems       denies dysphagia and sob  Physical Exam  General:  normal appearance.   Neck:  thyroid is enlarged, but i can't tell details. Additional Exam:  Hemoglobin A1C  5.4 %  FastTSH      [L]  0.17 uIU/mL     Impression & Recommendations:  Problem # 1:  DIABETES MELLITUS, TYPE II (ICD-250.00) overcontrolled  Problem # 2:  HYPERTHYROIDISM (ICD-242.90) due to #3  Problem # 3:  GOITER, MULTINODULAR (ICD-241.1) usually hereditary  Medications Added to Medication List This Visit: 1)  Humulin 70/30 70-30 % Susp (Insulin isophane & regular) .... 30 units qam 50 units qpm  Other Orders: TLB-A1C / Hgb A1C  (Glycohemoglobin) ([id number]) Radiology Referral (Radiology) Est. Patient Level IV [id number])  Patient Instructions: 1)  tests are being ordered for you today.  a few days after the test(s), please call [phone] to hear your test results. 2)  pending the test results, please continue novolin 70/30, 35 units am and 10 units pm. 3)  check thyroid scan (a special but easy type of thyroid x ray).  you will be called with a day and time for an appointment.  based on that, i would probably recommend a pill of radioactive iodine to shrink the thyroid. 4)  Please schedule a follow-up appointment in 3 months. 5)  (update: i left message on phone-tree:  reduce insulin to 30 units am and 5 units pm).

## 2010-10-31 NOTE — Miscellaneous (Signed)
Summary: Orders Update   Clinical Lists Changes  Orders: Added new Referral order of Radiology Referral (Radiology) - Signed 

## 2010-10-31 NOTE — Assessment & Plan Note (Signed)
Summary: 3 MTH FU  STC   Vital Signs:  Patient profile:   60 year old male Height:      73 inches (185.42 cm) Weight:      240 pounds (109.09 kg) BMI:     31.78 O2 Sat:      90 % on Room air Temp:     97.7 degrees F (36.50 degrees C) oral Pulse rate:   82 / minute BP sitting:   110 / 62  (left arm) Cuff size:   large  Vitals Entered By: Rebeca Alert MA (May 08, 2010 1:16 PM)  O2 Flow:  Room air CC: 3 mo F/U/aj Is Patient Diabetic? Yes   Referring Raistlin Gum:  Dr Wallene Huh Primary Arayla Kruschke:  Biagio Borg MD  CC:  3 mo F/U/aj.  History of Present Illness: the status of at least 3 ongoing medical problems is addressed today: dm:  he brings a record of his cbg's which i have reviewed today.  all are in the low to mid-100's.  no hypglycemic sxs. goiter: pt says he does not notice it.   abd pain:  he feels better--no abd pain since gallstone was removed.    Current Medications (verified): 1)  Carvedilol 12.5 Mg Tabs (Carvedilol) .[name].. 1po Two Times A Day 2)  Adult Aspirin Low Strength 81 Mg  Tbdp (Aspirin) .... Take 1 By Mouth Qd 3)  Plavix 75 Mg  Tabs (Clopidogrel Bisulfate) .... Take 1 By Mouth Qd 4)  Isosorbide Mononitrate Cr 60 Mg Xr24h-Tab (Isosorbide Mononitrate) .... Take 1 Tablet By Mouth Once A Day 5)  Celexa 10 Mg Tabs (Citalopram Hydrobromide) .[name].. 1 Tab Once Daily 6)  Rena-Vite  Tabs (B Complex-C-Folic Acid) .... Take 1 Tablet By Mouth Once A Day 7)  Coreg 25 Mg Tabs (Carvedilol) .... Take 1 Tablet By Mouth Two Times A Day 8)  Humulin 70/30 70-30 % Susp (Insulin Isophane & Regular) .... 30 Units Qam 50 Units Qpm 9)  A1c 250.41 10)  Gabapentin 100 Mg Caps (Gabapentin) .... 2 Caps At Bedtime 11)  Epogen 3000 Unit/ml Soln (Epoetin Alfa) .... At Dialysis 12)  Oscal 500/200 D-3 500-200 Mg-Unit Tabs (Calcium-Vitamin D) .... 2 By Mouth 3 Times A Week 13)  Ranitidine Hcl 300 Mg Caps (Ranitidine Hcl) .[name].. 1 By Mouth Daily 14)  Sensipar 90 Mg Tabs (Cinacalcet Hcl)  .[name].. 1 By Mouth Once Daily 15)  Welchol 3.75 Gm Pack (Colesevelam Hcl) .[name].. 1 Pack By Mouth in Water Once Daily  Allergies (verified): 1)  ! Keflex 2)  ! * Statins  Past History:  Past Medical History: Last updated: 02/01/2010  1. Morbid obesity  2. Diabetes  3. CHF, primarily diastolic      --EF previously 37%      --last echo. 55% with mild TR      --CPX: VO2 12.0. slope of 45. Corrected to ideal body weight VO2 = 18.0      --RHC 2/11: RA 7 RV 36/10/13 PA 26/16 (22) pcwp 2 PA sats 55% and 58% Fick 5.6/2.3  4. ESRD  - dialysis dec 2009  5. OSA on CPAP  6. COPD on nightime O2  7. Myositis with a sed rate of 100       -statin stopped  8. Hyperlipidemia  9.Hypertension 10.Gout 12. Diabetes     --c/b peripheral neurpoathy 13. GERD 14. Elevated R hemidiaphragm 15. Chronic dyspnea, multifactorial  ROUTINE GENERAL MEDICAL EXAM@HEALTH  CARE FACL (ICD-V70.0) SPECIAL SCREENING MALIGNANT NEOPLASM OF PROSTATE (  ICD-V76.44) FOOT PAIN (ICD-729.5) GOITER, MULTINODULAR (ICD-241.1) ONYCHOMYCOSIS, TOENAILS (ICD-110.1) Benign prostatic hypertrophy Depression GERD cervical stenosis Colonic polyps, hx of Bipolar disease Gout Dementia Left knee djd - severe, end stage Diabetes mellitus, type II  Review of Systems       he has few lbs weight loss.  no n/v  Physical Exam  Pulses:  dorsalis pedis intact bilat.  Extremities:  no deformity.  no ulcer on the feet.  feet are of normal color and temp.  no edema  Neurologic:  sensation is intact to touch on the feet  Additional Exam:   Hemoglobin A1C       [H]  7.1 %      Impression & Recommendations:  Problem # 1:  GOITER, MULTINODULAR (ICD-241.1) he can't have i-131 now, because of recent contrast  Problem # 2:  DIABETES MELLITUS, TYPE II (ICD-250.00) this is the best control this pt should aim for, given this regimen, which does match insulin to his changing needs throughout the day  Problem # 3:   cholecystitis improved  Medications Added to Medication List This Visit: 1)  Humulin 70/30 70-30 % Susp (Insulin isophane & regular) .... 30 units qam 5 units qpm  Other Orders: TLB-A1C / Hgb A1C (Glycohemoglobin) (83036-A1C) Est. Patient Level IV [id number])  Patient Instructions: 1)  blood tests are being ordered for you today.  please call [phone] to hear your test results. 2)  pending the test results, please continue the same medications for now. 3)  we'll have to recheck the thyroid x-ray after it has been a few more months since x rays. 4)  (update: i left message on phone-tree:  rx as we discussed).

## 2010-10-31 NOTE — Assessment & Plan Note (Signed)
Summary: per check out/sf   Referring Provider:  Dr Wallene Huh Primary Provider:  Biagio Borg MD   History of Present Illness: [name] is 60 year old male with multiple medical problems including morbid obesity, diabetes, myositis with a sed rate > 100, COPD on nighttime oxygen, sleep apnea on CPAP, end-stage renal disease, coronary artery disease, status post Taxus drug-eluting stent to the right coronary in September 2008, and a previous history of congestive heart failure secondary to ischemic cardiomyopathy with an EF of 37%.    Most recent echocardiogram Septe mber 2010  however, showed an EF of 55% with mild diastolic dysfunction. Myoview had EF of 43% with inferior scar. no ischemia. Had [facility] in 2/11 which showed low pressures (PCWP = 2) and normal cardiac output.  Last month admitted with ascending cholangitis and klebsiella bacteremia. Underwent several ERCP attempts with eventual stent placement. Then underwent cholecystectomy due to retained   Feeling weak but getting somewhat stronger. Has lost 20 pounds. No CP or SOB or edema. Eating OK. No f/c. No palpitations.  Current Medications (verified): 1)  Allopurinol 300 Mg Tabs (Allopurinol) .... 2 By Mouth Daily 2)  Adult Aspirin Low Strength 81 Mg  Tbdp (Aspirin) .... Take 1 By Mouth Qd 3)  Plavix 75 Mg  Tabs (Clopidogrel Bisulfate) .... Take 1 By Mouth Qd 4)  Isosorbide Mononitrate Cr 60 Mg Xr24h-Tab (Isosorbide Mononitrate) .... Take 1 Tablet By Mouth Once A Day 5)  Celexa 10 Mg Tabs (Citalopram Hydrobromide) .[name].. 1 Tab Once Daily 6)  Rena-Vite  Tabs (B Complex-C-Folic Acid) .... Take 1 Tablet By Mouth Once A Day 7)  Coreg 25 Mg Tabs (Carvedilol) .... Take 1 Tablet By Mouth Two Times A Day 8)  Amlodipine Besylate 10 Mg Tabs (Amlodipine Besylate) .... Daily 9)  Humulin 70/30 70-30 % Susp (Insulin Isophane & Regular) .... 40 Units Qam 10 Units Qpm 10)  A1c 250.41 11)  Gabapentin 100 Mg Caps (Gabapentin) .... 2 Caps At  Bedtime 12)  Epogen 3000 Unit/ml Soln (Epoetin Alfa) .... At Dialysis 13)  Oscal 500/200 D-3 500-200 Mg-Unit Tabs (Calcium-Vitamin D) .... 2 By Mouth 3 Times A Week 14)  Ranitidine Hcl 300 Mg Caps (Ranitidine Hcl) .[name].. 1 By Mouth Daily  Allergies (verified): 1)  ! Keflex 2)  ! * Statins  Past History:  Past Medical History:  1. Morbid obesity  2. Diabetes  3. CHF, primarily diastolic      --EF previously 37%      --last echo. 55% with mild TR      --CPX: VO2 12.0. slope of 45. Corrected to ideal body weight VO2 = 18.0      --RHC 2/11: RA 7 RV 36/10/13 PA 26/16 (22) pcwp 2 PA sats 55% and 58% Fick 5.6/2.3  4. ESRD  - dialysis dec 2009  5. OSA on CPAP  6. COPD on nightime O2  7. Myositis with a sed rate of 100       -statin stopped  8. Hyperlipidemia  9.Hypertension 10.Gout 12. Diabetes     --c/b peripheral neurpoathy 13. GERD 14. Elevated R hemidiaphragm 15. Chronic dyspnea, multifactorial  ROUTINE GENERAL MEDICAL EXAM@HEALTH  CARE FACL (ICD-V70.0) SPECIAL SCREENING MALIGNANT NEOPLASM OF PROSTATE (ICD-V76.44) FOOT PAIN (ICD-729.5) GOITER, MULTINODULAR (ICD-241.1) ONYCHOMYCOSIS, TOENAILS (ICD-110.1) Benign prostatic hypertrophy Depression GERD cervical stenosis Colonic polyps, hx of Bipolar disease Gout Dementia Left knee djd - severe, end stage  Review of Systems       As per HPI and past medical history; otherwise  all systems negative.   Vital Signs:  Patient profile:   60 year old male Height:      73 inches Weight:      235 pounds BMI:     31.12 Pulse rate:   88 / minute Pulse (ortho):   98 / minute Resp:     18 per minute BP sitting:   88 / 62  (left arm) BP standing:   108 / 60  Vitals Entered By: Levora Angel, CNA (Jan 30, 2010 2:01 PM)  Serial Vital Signs/Assessments:  Time      Position  BP       Pulse  Resp  Temp     By           Lying RA  94/58    86                    Levora Angel, CNA           Sitting   82/56    [address], CNA           Standing  108/60   98                    [address], CNA 2 Min     Standing  138/70   96                    [place], CNA  Comments: Lying- No complaints Sitting- No complaints Standing- No complaints By: Levora Angel, CNA  2 Min 2 Min Standing- Weakness By: Levora Angel, CNA    Physical Exam  General:  Gen: no acute distress. no resp difficulty HEENT: normal Neck: supple. no JVD. Carotids 2+ bilat; no bruits.  Cor: PMI nondisplaced. Regular rate & rhythm. No rubs, +s4, murmur. Lungs: clear Abdomen: obese. soft, nontender, nondistended. umbilical wound healing well. good bowel sounds. Extremities: no cyanosis, clubbing, rash, edema Neuro: alert & orientedx3, cranial nerves grossly intact. moves all 4 extremities w/o difficulty. affect pleasant    Impression & Recommendations:  Problem # 1:  DIASTOLIC HEART FAILURE, CHRONIC (ICD-428.32) Stable. Likely a bit dry. May need to boost dry weight a bit in HD.  Problem # 2:  HYPOTENSION, UNSPECIFIED (ICD-458.9) Likely volume depleted. Cut amlodipine to 5mg  daily. As above may need to let dry weight drift up during HD a bit.   Other Orders: EKG w/ Interpretation (93000)  Patient Instructions: 1)  Decrease Amlodipine to 5mg  daily 2)  Follow up in 4 months

## 2010-10-31 NOTE — Miscellaneous (Signed)
Summary: Order/[facility]  Order/[facility]   Imported By: Bubba Hales 02/20/2010 11:39:00  _____________________________________________________________________  External Attachment:    Type:   Image     Comment:   External Document

## 2010-10-31 NOTE — Miscellaneous (Signed)
Summary: PT Order/[facility]  PT Order/[facility]   Imported By: Phillis Knack 02/20/2010 08:35:39  _____________________________________________________________________  External Attachment:    Type:   Image     Comment:   External Document

## 2010-10-31 NOTE — Assessment & Plan Note (Signed)
Summary: LOW BP   Vital Signs:  Patient profile:   60 year old male O2 Sat:      90 % on Room air Pulse rate:   90 / minute BP sitting:   64 / 52  (left arm) Cuff size:   regular  Vitals Entered By: Charlsie Quest, [place] (Feb 22, 2010 3:58 PM)  O2 Flow:  Room air CC: Pt became lightheaded while waiting in exam room w/wife who was going to be seen by DR Ronnald Ramp. CBG Result 169 Comments Patients wife exited exam room and told surrounding staff that her husband felt like he was going to pass out. Pt was assisted to the exam table to lay down by Dr Alain Marion.  Pt c/o lightheadedness. Reported to me and wife he had no complaints all day. No diet changes and cbgs were slightly elevated this am in the 160's. He took regular dose of insulin and was seen by dialysis Tuesday, due again tomorrow. CBG 169 - ate normal lunch at approx 11 to 12 today BP was faint at 64/52 pulse 90 and stayed the same after 29min, 35min and 61min.  Dr [name] was alerted to situation, EMS was called for transport to ER but pt refused and EMS was cancelled.  BP at approx 40 minutes was 90/62 pulse 90 Again at approx 50 minutes bp was heard clearly at 110/62 p94...................Marland KitchenCharlsie Quest, CMA  Feb 22, 2010 4:09 PM    Primary Care Provider:  Biagio Borg MD  CC:  Pt became lightheaded while waiting in exam room w/wife who was going to be seen by DR Ronnald Ramp..  History of Present Illness: hx as documented above.  Pt denies CP, sob, doe, wheezing, orthopnea, pnd, worsening LE edema, palps, dizziness or syncope Pt denies new neuro symptoms such as headache, facial or extremity weakness  No fever, trauma, falls or overt bleeding.   Pt states this is common occurrence, and has happened in the same fashion several times before and always seems to resolve on its own.    Problems Prior to Update: 1)  Hyperthyroidism  (ICD-242.90) 2)  Transaminases, Serum, Elevated  (ICD-790.4) 3)  Diabetes Mellitus, Type II  (ICD-250.00) 4)   Unspecified Hypotension  (ICD-458.9) 5)  Fatigue  (ICD-780.79) 6)  Hypotension, Unspecified  (ICD-458.9) 7)  Fatigue / Malaise  (ICD-780.79) 8)  Gastroenteritis, Viral  (ICD-008.8) 9)  Preventive Health Care  (ICD-V70.0) 10)  Colonic Polyps, Hx of  (ICD-V12.72) 11)  Depression  (ICD-311) 12)  Benign Prostatic Hypertrophy  (ICD-600.00) 13)  Dementia  (ICD-294.8) 14)  Pre-operative Cardiac Exam  (ICD-V72.81) 15)  Pulmonary Nodule, Right Lower Lobe  ([id number]) 16)  Diastolic Heart Failure, Chronic  (ICD-428.32) 17)  Cad, Native Vessel  (ICD-414.01) 18)  Routine General Medical Exam@health  Care Facl  (ICD-V70.0) 19)  Special Screening Malignant Neoplasm of Prostate  (ICD-V76.44) 20)  Dyspnea  (ICD-786.05) 21)  Foot Pain  (ICD-729.5) 22)  Goiter, Multinodular  (ICD-241.1) 23)  Onychomycosis, Toenails  (ICD-110.1) 24)  Dyslipidemia  (ICD-272.4) 25)  Renal Insufficiency  (ICD-588.9) 26)  Peripheral Neuropathy  (ICD-356.9) 27)  Hypertension  (ICD-401.9) 28)  Gout  (ICD-274.9) 29)  Gerd  (ICD-530.81) 30)  Congestive Heart Failure  (ICD-428.0)  Medications Prior to Update: 1)  Allopurinol 300 Mg Tabs (Allopurinol) .... 2 By Mouth Daily 2)  Adult Aspirin Low Strength 81 Mg  Tbdp (Aspirin) .... Take 1 By Mouth Qd 3)  Plavix 75 Mg  Tabs (Clopidogrel Bisulfate) .... Take 1 By Mouth  Qd 4)  Isosorbide Mononitrate Cr 60 Mg Xr24h-Tab (Isosorbide Mononitrate) .... Take 1 Tablet By Mouth Once A Day 5)  Celexa 10 Mg Tabs (Citalopram Hydrobromide) .[name].. 1 Tab Once Daily 6)  Rena-Vite  Tabs (B Complex-C-Folic Acid) .... Take 1 Tablet By Mouth Once A Day 7)  Coreg 25 Mg Tabs (Carvedilol) .... Take 1 Tablet By Mouth Two Times A Day 8)  Humulin 70/30 70-30 % Susp (Insulin Isophane & Regular) .... 30 Units Qam 50 Units Qpm 9)  A1c 250.41 10)  Gabapentin 100 Mg Caps (Gabapentin) .... 2 Caps At Bedtime 11)  Epogen 3000 Unit/ml Soln (Epoetin Alfa) .... At Dialysis 12)  Oscal 500/200 D-3 500-200 Mg-Unit  Tabs (Calcium-Vitamin D) .... 2 By Mouth 3 Times A Week 13)  Ranitidine Hcl 300 Mg Caps (Ranitidine Hcl) .[name].. 1 By Mouth Daily  Current Medications (verified): 1)  Allopurinol 300 Mg Tabs (Allopurinol) .... 2 By Mouth Daily 2)  Adult Aspirin Low Strength 81 Mg  Tbdp (Aspirin) .... Take 1 By Mouth Qd 3)  Plavix 75 Mg  Tabs (Clopidogrel Bisulfate) .... Take 1 By Mouth Qd 4)  Isosorbide Mononitrate Cr 60 Mg Xr24h-Tab (Isosorbide Mononitrate) .... Take 1 Tablet By Mouth Once A Day 5)  Celexa 10 Mg Tabs (Citalopram Hydrobromide) .[name].. 1 Tab Once Daily 6)  Rena-Vite  Tabs (B Complex-C-Folic Acid) .... Take 1 Tablet By Mouth Once A Day 7)  Coreg 25 Mg Tabs (Carvedilol) .... Take 1 Tablet By Mouth Two Times A Day 8)  Humulin 70/30 70-30 % Susp (Insulin Isophane & Regular) .... 30 Units Qam 50 Units Qpm 9)  A1c 250.41 10)  Gabapentin 100 Mg Caps (Gabapentin) .... 2 Caps At Bedtime 11)  Epogen 3000 Unit/ml Soln (Epoetin Alfa) .... At Dialysis 12)  Oscal 500/200 D-3 500-200 Mg-Unit Tabs (Calcium-Vitamin D) .... 2 By Mouth 3 Times A Week 13)  Ranitidine Hcl 300 Mg Caps (Ranitidine Hcl) .[name].. 1 By Mouth Daily  Allergies (verified): 1)  ! Keflex 2)  ! * Statins  Past History:  Past Medical History: Last updated: 02/01/2010  1. Morbid obesity  2. Diabetes  3. CHF, primarily diastolic      --EF previously 37%      --last echo. 55% with mild TR      --CPX: VO2 12.0. slope of 45. Corrected to ideal body weight VO2 = 18.0      --RHC 2/11: RA 7 RV 36/10/13 PA 26/16 (22) pcwp 2 PA sats 55% and 58% Fick 5.6/2.3  4. ESRD  - dialysis dec 2009  5. OSA on CPAP  6. COPD on nightime O2  7. Myositis with a sed rate of 100       -statin stopped  8. Hyperlipidemia  9.Hypertension 10.Gout 12. Diabetes     --c/b peripheral neurpoathy 13. GERD 14. Elevated R hemidiaphragm 15. Chronic dyspnea, multifactorial  ROUTINE GENERAL MEDICAL EXAM@HEALTH  CARE FACL (ICD-V70.0) SPECIAL SCREENING MALIGNANT NEOPLASM OF  PROSTATE (ICD-V76.44) FOOT PAIN (ICD-729.5) GOITER, MULTINODULAR (ICD-241.1) ONYCHOMYCOSIS, TOENAILS (ICD-110.1) Benign prostatic hypertrophy Depression GERD cervical stenosis Colonic polyps, hx of Bipolar disease Gout Dementia Left knee djd - severe, end stage Diabetes mellitus, type II  Past Surgical History: Last updated: 10/29/2008 stent-06/11/08 neck surgery-2/09 back surgery-1998  Social History: Last updated: 10/29/2008 Patient states former smoker. Quit smoking 2007.  Smoked x 25 years 1/2 ppd. Pt is married with children. Pt is disabled.  Formerly Lawyer for [employer].  Risk Factors: Smoking Status: quit (06/18/2007)  Physical Exam  General:  alert and overweight-appearing.   Head:  normocephalic and atraumatic.   Eyes:  vision grossly intact, pupils equal, and pupils round.   Ears:  R ear normal and L ear normal.   Nose:  no external deformity and no nasal discharge.   Mouth:  no gingival abnormalities and pharynx pink and moist.   Neck:  supple and no masses.   Lungs:  normal respiratory effort and normal breath sounds.   Heart:  normal rate and regular rhythm.   Extremities:  trace edema bilat   Impression & Recommendations:  Problem # 1:  UNSPECIFIED HYPOTENSION (ICD-458.9)  documented above, ? prerenal due to low volume due to dialysis;  assoc with dizziness and weakness but also seemed to resolve quickly without specific treatment, including assoc symptoms.  Pt adamant he will not go to ER, declines further evaluation.  Son present will drive pt and wife home.  Pt to f/u situation with renal at HD tomorrow.  Orders: No Charge Patient Arrived (NCPA0) (NCPA0)  Complete Medication List: 1)  Allopurinol 300 Mg Tabs (Allopurinol) .... 2 by mouth daily 2)  Adult Aspirin Low Strength 81 Mg Tbdp (Aspirin) .... Take 1 by mouth qd 3)  Plavix 75 Mg Tabs (Clopidogrel bisulfate) .... Take 1 by mouth qd 4)  Isosorbide Mononitrate Cr 60 Mg Xr24h-tab  (Isosorbide mononitrate) .... Take 1 tablet by mouth once a day 5)  Celexa 10 Mg Tabs (Citalopram hydrobromide) .[name].. 1 tab once daily 6)  Rena-vite Tabs (B complex-c-folic acid) .... Take 1 tablet by mouth once a day 7)  Coreg 25 Mg Tabs (Carvedilol) .... Take 1 tablet by mouth two times a day 8)  Humulin 70/30 70-30 % Susp (Insulin isophane & regular) .... 30 units qam 50 units qpm 9)  A1c 250.41  10)  Gabapentin 100 Mg Caps (Gabapentin) .... 2 caps at bedtime 11)  Epogen 3000 Unit/ml Soln (Epoetin alfa) .... At dialysis 12)  Oscal 500/200 D-3 500-200 Mg-unit Tabs (Calcium-vitamin d) .... 2 by mouth 3 times a week 13)  Ranitidine Hcl 300 Mg Caps (Ranitidine hcl) .[name].. 1 by mouth daily  Patient Instructions: 1)  please keep your HD appt tomorrow 2)  please continue to monitor your BP at home as you do 3)  please have your son drive you home 4)  I think you should not drive in the future due to your BP occasionally running low like today, and increasing the risk for yourself and others due to accident  Laboratory Results   Blood Tests    Date/Time Reported: .......Marland KitchenCharlsie Quest, CMA  Feb 22, 2010 4:14 PM   CBG Random:: 169

## 2010-10-31 NOTE — Miscellaneous (Signed)
  Clinical Lists Changes  Observations: Added new observation of CARDCATHFIND: ASSESSMENT:  Normal filling pressures with adequate cardiac output.   PLAN/DISCUSSION:  His hemodynamics looked good.  I do not think these explain his profound fatigue.  We will continue to work with him and his per primary care team to further evaluate his fatigue.     (11/12/2009 8:36)      Cardiac Cath  Procedure date:  11/12/2009  Findings:      ASSESSMENT:  Normal filling pressures with adequate cardiac output.   PLAN/DISCUSSION:  His hemodynamics looked good.  I do not think these explain his profound fatigue.  We will continue to work with him and his per primary care team to further evaluate his fatigue.

## 2010-10-31 NOTE — Progress Notes (Signed)
  Phone Note Call from Patient   Caller: Patient---[phone] or [phone] Call For: Biagio Borg MD Summary of Call: Pt was seen yesterday by Dr [name], pt is worse today. Neck sore,cant turn head, pt can barely move today. Wife wants to know if he should come back today for re-check or go to the hospital, please advise. Initial call taken by: Denice Paradise,  August 01, 2010 10:44 AM  Follow-up for Phone Call        ok to continue meds as is for now, but should be seen again if has pain or numbness or weakness increased to the arms, or fever or other bowel or bladder diffictuly along with the pain  based on his exam yesterday, I would not think he has to go to the ER, but might conisder this if the pain becomes severe Follow-up by: Biagio Borg MD,  August 01, 2010 11:16 AM  Additional Follow-up for Phone Call Additional follow up Details #1::        Pt advised of above and voiced understanding Additional Follow-up by: Crissie Sickles, CMA,  August 01, 2010 11:24 AM

## 2010-10-31 NOTE — Assessment & Plan Note (Signed)
Summary: 3 mos f/u f/u // cd   Vital Signs:  Patient profile:   60 year old male Height:      73 inches (185.42 cm) Weight:      244.50 pounds (111.14 kg) O2 Sat:      92 % on Room air Temp:     98.8 degrees F (37.11 degrees C) oral Pulse rate:   82 / minute BP sitting:   124 / 82 Cuff size:   large  Vitals Entered By: Rebeca Alert CMA Deborra Medina) (August 04, 2010 2:34 PM)  O2 Flow:  Room air CC: Follow-up visit/aj Is Patient Diabetic? Yes   Referring Provider:  Dr Wallene Huh Primary Provider:  Biagio Borg MD  CC:  Follow-up visit/aj.  History of Present Illness: the status of at least 3 ongoing medical problems is addressed today: renal failure:  pt had another ct with contrast, approx 2 mos ago dm:  no cbg record, but states cbg's are in the low-100's.  it is lower in am than later in the day.  goiter:  pt says he does not notice.  Current Medications (verified): 1)  Carvedilol 12.5 Mg Tabs (Carvedilol) .[name].. 1po Two Times A Day 2)  Adult Aspirin Low Strength 81 Mg  Tbdp (Aspirin) .... Take 1 By Mouth Qd 3)  Plavix 75 Mg  Tabs (Clopidogrel Bisulfate) .... Take 1 By Mouth Once Daily 4)  Rena-Vite  Tabs (B Complex-C-Folic Acid) .... Take 1 Tablet By Mouth Once A Day 5)  Humulin 70/30 70-30 % Susp (Insulin Isophane & Regular) .... 30 Units Qam 5 Units Qpm 6)  A1c 250.41 7)  Gabapentin 300 Mg Caps (Gabapentin) .... Take One At Bedtine 8)  Epogen 3000 Unit/ml Soln (Epoetin Alfa) .... At Dialysis 9)  Oscal 500/200 D-3 500-200 Mg-Unit Tabs (Calcium-Vitamin D) .... 2 By Mouth 3 Times A Week 10)  Ranitidine Hcl 300 Mg Caps (Ranitidine Hcl) .[name].. 1 By Mouth Daily 11)  Sensipar 90 Mg Tabs (Cinacalcet Hcl) .[name].. 1 By Mouth Once Daily 12)  Allopurinol 100 Mg Tabs (Allopurinol) .... Take One Daily 13)  Fish Oil 1000 Mg Caps (Omega-3 Fatty Acids) .... Take Three Daily 14)  Flexeril 5 Mg Tabs (Cyclobenzaprine Hcl) .[name].. 1 By Mouth Three Times A Day As Needed 15)  Tramadol Hcl 50 Mg  Tabs (Tramadol Hcl) .[name].. 1  By Mouth Q 6 Hrs As Needed 16)  Prednisone 10 Mg Tabs (Prednisone) .... 3po Qd For 3days, Then 2po Qd For 3days, Then 1po Qd For 3days, Then Stop  Allergies (verified): 1)  ! Keflex 2)  ! * Statins  Past History:  Past Medical History: Last updated: 07/17/2010  1. Morbid obesity  2. Diabetes  3. CHF, primarily diastolic      --EF previously 37%      --last echo. 55% with mild TR      --CPX: VO2 12.0. slope of 45. Corrected to ideal body weight VO2 = 18.0      --RHC 2/11: RA 7 RV 36/10/13 PA 26/16 (22) pcwp 2 PA sats 55% and 58% Fick 5.6/2.3  4. ESRD  - dialysis dec 2009  5. OSA on CPAP  6. COPD on nightime O2  7. Myositis with a sed rate of 100       -statin stopped  8. Hyperlipidemia  9.Hypertension 10.Gout 12. Diabetes     --c/b peripheral neurpoathy 13. GERD 14. Elevated R hemidiaphragm 15. Chronic dyspnea, multifactorial  ROUTINE GENERAL MEDICAL EXAM@HEALTH  CARE FACL (  ICD-V70.0) SPECIAL SCREENING MALIGNANT NEOPLASM OF PROSTATE (ICD-V76.44) FOOT PAIN (ICD-729.5) GOITER, MULTINODULAR (ICD-241.1) ONYCHOMYCOSIS, TOENAILS (ICD-110.1) Benign prostatic hypertrophy Depression GERD cervical stenosis Colonic polyps, hx of Bipolar disease Gout Dementia Left knee djd - severe, end stage Diabetes mellitus, type II  Review of Systems  The patient denies weight loss and weight gain.    Physical Exam  General:  obese.  no distress  Neck:  thyroid is enlarged, but i can't tell details. Skin:  injection sites without lesions.   Additional Exam:  FastTSH                   0.73 uIU/mL                 0.35-5.50 Hemoglobin A1C            6.0 %                       4.6-6.5 Free T4                   0.73 ng/dL       Impression & Recommendations:  Problem # 1:  ESRD (ICD-585.6) this means he'll have to wait longer than usual after a contrast radiol study, to have thyroid nuc med studies.  Problem # 2:  GOITER, MULTINODULAR  (ICD-241.1) euthyroid now  Problem # 3:  DIABETES MELLITUS, TYPE II (ICD-250.00) overcontrolled  Medications Added to Medication List This Visit: 1)  Humulin 70/30 70-30 % Susp (Insulin isophane & regular) .... 30 units before breakfast, and 5 units before evening meal 2)  Humulin 70/30 70-30 % Susp (Insulin isophane & regular) .... 30 units before breakfast  Other Orders: TLB-TSH (Thyroid Stimulating Hormone) (84443-TSH) TLB-A1C / Hgb A1C (Glycohemoglobin) (83036-A1C) TLB-T4 (Thyrox), Free [phone]) Est. Patient Level IV [id number]  Patient Instructions: 1)  blood tests are being ordered for you today.  please call [phone] to hear your test results. 2)  pending the test results, please continue the same medications for now. 3)  Please schedule a follow-up appointment in 3 months. 4)  we'll plan to to they thyroid x ray upon return, when it has been longer since your ct scan.   5)  (update: i left message on phone-tree:  stop pm insulin).   Orders Added: 1)  TLB-TSH (Thyroid Stimulating Hormone) [84443-TSH] 2)  TLB-A1C / Hgb A1C (Glycohemoglobin) [83036-A1C] 3)  TLB-T4 (Thyrox), Free [id number] 4)  Est. Patient Level IV [id number]

## 2010-10-31 NOTE — Assessment & Plan Note (Signed)
Summary: eph/post cath   Visit Type:  EPH Referring Provider:  Dr Wallene Huh Primary Provider:  Loanne Drilling  CC:  no cardiac complaints today.  History of Present Illness: this is a 60 year old, African American male, patient, who underwent right heart catheterization November 11, 2009 because of persistent fatigue. This revealed normal filling pressures with adequate cardiac output. This did not explain his profound fatigue. The patient does have end-stage renal disease and coronary artery disease, as well as congestive heart failure, secondary to ischemic cardiomyopathy, ejection fraction about 40%.  The patient says his fatigue is about the same. He denies chest pain, palpitations, dyspnea, dyspnea on exertion, dizziness, or presyncope.  Current Medications (verified): 1)  Zantac 300 Mg  Tabs (Ranitidine Hcl) .... Take 1 By Mouth Qd 2)  Allopurinol 100 Mg  Tabs (Allopurinol) .... Take 2 Tabs By Mouth Daily 3)  Adult Aspirin Low Strength 81 Mg  Tbdp (Aspirin) .... Take 1 By Mouth Qd 4)  Plavix 75 Mg  Tabs (Clopidogrel Bisulfate) .... Take 1 By Mouth Qd 5)  Isosorbide Mononitrate Cr 60 Mg Xr24h-Tab (Isosorbide Mononitrate) .... Take 1 Tablet By Mouth Once A Day 6)  Celexa 10 Mg Tabs (Citalopram Hydrobromide) .[name].. 1 Tab Once Daily 7)  Rena-Vite  Tabs (B Complex-C-Folic Acid) .... Take 1 Tablet By Mouth Once A Day 8)  Coreg 25 Mg Tabs (Carvedilol) .... Take 1 Tablet By Mouth Two Times A Day 9)  Amlodipine Besylate 10 Mg Tabs (Amlodipine Besylate) .... Daily 10)  Humulin 70/30 70-30 % Susp (Insulin Isophane & Regular) .... 40 Units Qam 15 Units Qpm 11)  A1c 250.41 .... B-12 331.0 Rpr 331.0 12)  Gabapentin 100 Mg Caps (Gabapentin) .... 2 Caps At Bedtime  Allergies: 1)  ! Keflex 2)  ! * Statins  Past History:  Past Medical History: Last updated: 10/14/2009  1. Morbid obesity  2. Diabetes  3. CHF, primarily diastolic      --EF previously 37%      --last echo. 55% with mild TR  --CPX: VO2 12.0. slope of 45. Corrected to ideal body weight VO2 = 18.0  4. ESRD  - dialysis dec 2009  5. OSA on CPAP  6. COPD on nightime O2  7. Myositis with a sed rate of 100       -statin stopped  8. Hyperlipidemia  9.Hypertension 10.Gout 12. Diabetes     --c/b peripheral neurpoathy 13. GERD 14. Elevated R hemidiaphragm 15. Chronic dyspnea, multifactorial  ROUTINE GENERAL MEDICAL EXAM@HEALTH  CARE FACL (ICD-V70.0) SPECIAL SCREENING MALIGNANT NEOPLASM OF PROSTATE (ICD-V76.44) FOOT PAIN (ICD-729.5) GOITER, MULTINODULAR (ICD-241.1) ONYCHOMYCOSIS, TOENAILS (ICD-110.1) Benign prostatic hypertrophy Depression GERD cervical stenosis Colonic polyps, hx of Bipolar disease Gout Dementia Left knee djd - severe, end stage  Past Surgical History: Last updated: 10/29/2008 stent-06/11/08 neck surgery-2/09 back surgery-1998  Review of Systems       see history of present illness  Vital Signs:  Patient profile:   60 year old male Height:      73 inches Weight:      255 pounds BMI:     33.76 Pulse rate:   77 / minute Pulse rhythm:   irregular BP sitting:   130 / 80  (left arm) Cuff size:   large  Vitals Entered By: Julaine Hua, CMA (November 24, 2009 9:41 AM)  Physical Exam  General:   Well-nournished, in no acute distress. Neck: No JVD, HJR, Bruit, or thyroid enlargement Lungs: No tachypnea, clear without wheezing, rales, or rhonchi Cardiovascular:  RRR, PMI not displaced, heart sounds normal, no murmurs, gallops, bruit, thrill, or heave. Abdomen: BS normal. Soft without organomegaly, masses, lesions or tenderness. Extremities: Right groin without hematoma or hemorrhage,without cyanosis, clubbing or edema. Good distal pulses bilateral SKin: Warm, no lesions or rashes  Musculoskeletal: No deformities Neuro: no focal signs    EKG  Procedure date:  11/24/2009  Findings:      normal sinus rhythm with PVC. No acute change  Impression & Recommendations:  Problem #  1:  FATIGUE / MALAISE (ICD-780.79) Patient continues to have fatigue. Right heart catheter did not demonstrate a cause for this. He had normal filling pressures with adequate quick cardiac output.His been on hemodialysis for approximately one year now, which is contributing to his fatigue.  Problem # 2:  DIASTOLIC HEART FAILURE, CHRONIC (ICD-428.32) Patient's heart failure is controlled His updated medication list for this problem includes:    Adult Aspirin Low Strength 81 Mg Tbdp (Aspirin) .[name]... Take 1 by mouth qd    Plavix 75 Mg Tabs (Clopidogrel bisulfate) .[name]... Take 1 by mouth qd    Isosorbide Mononitrate Cr 60 Mg Xr24h-tab (Isosorbide mononitrate) .[name]... Take 1 tablet by mouth once a day    Coreg 25 Mg Tabs (Carvedilol) .[name]... Take 1 tablet by mouth two times a day    Amlodipine Besylate 10 Mg Tabs (Amlodipine besylate) .[name]... Daily  Problem # 3:  CAD, NATIVE VESSEL (ICD-414.01) patient has a history of a Taxus drug-eluting stent to the right coronary artery in September 2008. He denies recent chest pain. His updated medication list for this problem includes:    Adult Aspirin Low Strength 81 Mg Tbdp (Aspirin) .[name]... Take 1 by mouth qd    Plavix 75 Mg Tabs (Clopidogrel bisulfate) .[name]... Take 1 by mouth qd    Isosorbide Mononitrate Cr 60 Mg Xr24h-tab (Isosorbide mononitrate) .[name]... Take 1 tablet by mouth once a day    Coreg 25 Mg Tabs (Carvedilol) .[name]... Take 1 tablet by mouth two times a day    Amlodipine Besylate 10 Mg Tabs (Amlodipine besylate) .[name]... Daily  Orders: EKG w/ Interpretation ([id number])  Patient Instructions: 1)  Your physician recommends that you schedule a follow-up appointment in: 2 or 3 months with Dr. [name]

## 2010-10-31 NOTE — Assessment & Plan Note (Signed)
Summary: symptoms of gall stones per pt/lump in right breast-lb   Vital Signs:  Patient profile:   60 year old male Height:      73 inches Weight:      245.13 pounds BMI:     32.46 O2 Sat:      92 % on Room air Temp:     98.9 degrees F oral Pulse rate:   88 / minute BP sitting:   120 / 72  (left arm) Cuff size:   large  Vitals Entered By: Shirlean Mylar Ewing CMA Deborra Medina) (July 17, 2010 3:16 PM)  O2 Flow:  Room air CC: Right breast lump, gall stones/RE   Primary Care Provider:  Biagio Borg MD  CC:  Right breast lump and gall stones/RE.  History of Present Illness: here with acute c/o with wife  - c/o right breast area tender, swelling and ? lump to the area midl to mod for 4 wks, no prior hx of same, no recent med changes  also with recurrent dizziness still, usually worse 20 min after eating, but also other times as well; some improved off the imdur without the very low SBP episodes, not nearly as freq or severe   was not able to take the welchol due to cost  had recent RUE graft clotting, unable to open but spontaneously opened and using now successfully;  upper LUE fistula was started but now completed per vascular  Pt denies polydipsia, polyuria, or low sugar symptoms such as shakiness improved with eating.  Overall good compliance with meds, trying to follow low chol, DM diet, wt stable, little excercise however Pt denies CP, worsening sob, doe, wheezing, orthopnea, pnd, worsening LE edema, palps,  or syncope   Problems Prior to Update: 1)  Dizziness  (ICD-780.4) 2)  Gynecomastia  (ICD-611.1) 3)  Hypotension, Unspecified  (ICD-458.9) 4)  Hypocalcemia  (ICD-275.41) 5)  Preventive Health Care  (ICD-V70.0) 6)  Chest Pain  (ICD-786.50) 7)  Gait Disturbance  (ICD-781.2) 8)  Unspecified Hypotension  (ICD-458.9) 9)  Hyperthyroidism  (ICD-242.90) 10)  Transaminases, Serum, Elevated  (ICD-790.4) 11)  Diabetes Mellitus, Type II  (ICD-250.00) 12)  Unspecified Hypotension   (ICD-458.9) 13)  Fatigue  (ICD-780.79) 14)  Hypotension, Unspecified  (ICD-458.9) 15)  Fatigue / Malaise  (ICD-780.79) 16)  Gastroenteritis, Viral  (ICD-008.8) 17)  Preventive Health Care  (ICD-V70.0) 18)  Colonic Polyps, Hx of  (ICD-V12.72) 19)  Depression  (ICD-311) 20)  Benign Prostatic Hypertrophy  (ICD-600.00) 21)  Dementia  (ICD-294.8) 22)  Pre-operative Cardiac Exam  (ICD-V72.81) 23)  Pulmonary Nodule, Right Lower Lobe  ([id number]) 24)  Diastolic Heart Failure, Chronic  (ICD-428.32) 25)  Cad, Native Vessel  (ICD-414.01) 26)  Routine General Medical Exam@health  Care Facl  (ICD-V70.0) 27)  Special Screening Malignant Neoplasm of Prostate  (ICD-V76.44) 28)  Dyspnea  (ICD-786.05) 29)  Foot Pain  (ICD-729.5) 30)  Goiter, Multinodular  (ICD-241.1) 31)  Onychomycosis, Toenails  (ICD-110.1) 32)  Dyslipidemia  (ICD-272.4) 33)  Renal Insufficiency  (ICD-588.9) 34)  Peripheral Neuropathy  (ICD-356.9) 35)  Hypertension  (ICD-401.9) 36)  Gout  (ICD-274.9) 37)  Gerd  (ICD-530.81) 38)  Congestive Heart Failure  (ICD-428.0)  Medications Prior to Update: 1)  Carvedilol 12.5 Mg Tabs (Carvedilol) .[name].. 1po Two Times A Day 2)  Adult Aspirin Low Strength 81 Mg  Tbdp (Aspirin) .... Take 1 By Mouth Qd 3)  Plavix 75 Mg  Tabs (Clopidogrel Bisulfate) .... Take 1 By Mouth Qd 4)  Rena-Vite  Tabs (B Complex-C-Folic  Acid) .... Take 1 Tablet By Mouth Once A Day 5)  Humulin 70/30 70-30 % Susp (Insulin Isophane & Regular) .... 30 Units Qam 5 Units Qpm 6)  A1c 250.41 7)  Gabapentin 300 Mg Caps (Gabapentin) .... Take One At Bedtine 8)  Epogen 3000 Unit/ml Soln (Epoetin Alfa) .... At Dialysis 9)  Oscal 500/200 D-3 500-200 Mg-Unit Tabs (Calcium-Vitamin D) .... 2 By Mouth 3 Times A Week 10)  Ranitidine Hcl 300 Mg Caps (Ranitidine Hcl) .[name].. 1 By Mouth Daily 11)  Sensipar 90 Mg Tabs (Cinacalcet Hcl) .[name].. 1 By Mouth Once Daily 12)  Allopurinol 100 Mg Tabs (Allopurinol) .... Take One Daily 13)  Fish Oil 1000  Mg Caps (Omega-3 Fatty Acids) .... Take Three Daily  Current Medications (verified): 1)  Carvedilol 12.5 Mg Tabs (Carvedilol) .[name].. 1po Two Times A Day 2)  Adult Aspirin Low Strength 81 Mg  Tbdp (Aspirin) .... Take 1 By Mouth Qd 3)  Plavix 75 Mg  Tabs (Clopidogrel Bisulfate) .... Take 1 By Mouth Once Daily 4)  Rena-Vite  Tabs (B Complex-C-Folic Acid) .... Take 1 Tablet By Mouth Once A Day 5)  Humulin 70/30 70-30 % Susp (Insulin Isophane & Regular) .... 30 Units Qam 5 Units Qpm 6)  A1c 250.41 7)  Gabapentin 300 Mg Caps (Gabapentin) .... Take One At Bedtine 8)  Epogen 3000 Unit/ml Soln (Epoetin Alfa) .... At Dialysis 9)  Oscal 500/200 D-3 500-200 Mg-Unit Tabs (Calcium-Vitamin D) .... 2 By Mouth 3 Times A Week 10)  Ranitidine Hcl 300 Mg Caps (Ranitidine Hcl) .[name].. 1 By Mouth Daily 11)  Sensipar 90 Mg Tabs (Cinacalcet Hcl) .[name].. 1 By Mouth Once Daily 12)  Allopurinol 100 Mg Tabs (Allopurinol) .... Take One Daily 13)  Fish Oil 1000 Mg Caps (Omega-3 Fatty Acids) .... Take Three Daily  Allergies (verified): 1)  ! Keflex 2)  ! * Statins  Past History:  Past Surgical History: Last updated: 10/29/2008 stent-06/11/08 neck surgery-2/09 back surgery-1998  Social History: Last updated: 05/05/2010 Patient states former smoker. Quit smoking 2007.  Smoked x 25 years 1/2 ppd. Pt is married with children. Pt is disabled.  Formerly Lawyer for [employer]. Drug use-no  Risk Factors: Smoking Status: quit (06/18/2007)  Past Medical History:  1. Morbid obesity  2. Diabetes  3. CHF, primarily diastolic      --EF previously 37%      --last echo. 55% with mild TR      --CPX: VO2 12.0. slope of 45. Corrected to ideal body weight VO2 = 18.0      --RHC 2/11: RA 7 RV 36/10/13 PA 26/16 (22) pcwp 2 PA sats 55% and 58% Fick 5.6/2.3  4. ESRD  - dialysis dec 2009  5. OSA on CPAP  6. COPD on nightime O2  7. Myositis with a sed rate of 100       -statin stopped  8. Hyperlipidemia   9.Hypertension 10.Gout 12. Diabetes     --c/b peripheral neurpoathy 13. GERD 14. Elevated R hemidiaphragm 15. Chronic dyspnea, multifactorial  ROUTINE GENERAL MEDICAL EXAM@HEALTH  CARE FACL (ICD-V70.0) SPECIAL SCREENING MALIGNANT NEOPLASM OF PROSTATE (ICD-V76.44) FOOT PAIN (ICD-729.5) GOITER, MULTINODULAR (ICD-241.1) ONYCHOMYCOSIS, TOENAILS (ICD-110.1) Benign prostatic hypertrophy Depression GERD cervical stenosis Colonic polyps, hx of Bipolar disease Gout Dementia Left knee djd - severe, end stage Diabetes mellitus, type II  Review of Systems       all otherwise negative per pt -    Physical Exam  General:  alert and overweight-appearing.   Head:  normocephalic and atraumatic.   Eyes:  vision grossly intact, pupils equal, and pupils round.   Ears:  R ear normal and L ear normal.   Nose:  no external deformity and no nasal discharge.   Mouth:  no gingival abnormalities and pharynx pink and moist.   Neck:  supple and no masses.   Breasts:  overwt but suspect mild gynecomastia changes , right more than left, tender right breast with several nondiscrete firm area/lump about the areola most prominent/dicrete just lateral to the right areala Lungs:  normal respiratory effort and normal breath sounds.   Heart:  normal rate and regular rhythm.   Extremities:  no edema, no erythema    Impression & Recommendations:  Problem # 1:  GYNECOMASTIA (ICD-611.1)  with ? mass to right breast near areola - for mammogram, also for hormone eval  Orders: Radiology Referral (Radiology) T-Prolactin [phone]) T-Testosterone, Free and Total [id number]) T-Estradiol [phone])  Problem # 2:  DIZZINESS (ICD-780.4) improved, also for carotid dopplers;  had normal doppelrs 2008 but with very severe lipids and dizziness Orders: Radiology Referral (Radiology)  Problem # 3:  DIABETES MELLITUS, TYPE II (ICD-250.00)  His updated medication list for this problem includes:     Adult Aspirin Low Strength 81 Mg Tbdp (Aspirin) .[name]... Take 1 by mouth qd    Humulin 70/30 70-30 % Susp (Insulin isophane & regular) .Marland KitchenMarland KitchenMarland KitchenMarland Kitchen 30 units qam 5 units qpm  Labs Reviewed: Creat: 9.9 (05/05/2010)    Reviewed HgBA1c results: 7.1 (05/08/2010)  5.4 (02/06/2010) stable overall by hx and exam, ok to continue meds/tx as is   Problem # 4:  DYSLIPIDEMIA (ICD-272.4)  very severe - statin intolerant, and niaspan not likly to be tolerated well, already known statin intolerant and zetia and welchol too expensive;  pt and wife decline further tx at this time  Labs Reviewed: SGOT: 19 (05/05/2010)   SGPT: 18 (05/05/2010)   HDL:56.40 (05/05/2010)  Chol:450 (05/05/2010)  Trig:323.0 (05/05/2010)  Complete Medication List: 1)  Carvedilol 12.5 Mg Tabs (Carvedilol) .[name].. 1po two times a day 2)  Adult Aspirin Low Strength 81 Mg Tbdp (Aspirin) .... Take 1 by mouth qd 3)  Plavix 75 Mg Tabs (Clopidogrel bisulfate) .... Take 1 by mouth once daily 4)  Rena-vite Tabs (B complex-c-folic acid) .... Take 1 tablet by mouth once a day 5)  Humulin 70/30 70-30 % Susp (Insulin isophane & regular) .... 30 units qam 5 units qpm 6)  A1c 250.41  7)  Gabapentin 300 Mg Caps (Gabapentin) .... Take one at bedtine 8)  Epogen 3000 Unit/ml Soln (Epoetin alfa) .... At dialysis 9)  Oscal 500/200 D-3 500-200 Mg-unit Tabs (Calcium-vitamin d) .... 2 by mouth 3 times a week 10)  Ranitidine Hcl 300 Mg Caps (Ranitidine hcl) .[name].. 1 by mouth daily 11)  Sensipar 90 Mg Tabs (Cinacalcet hcl) .[name].. 1 by mouth once daily 12)  Allopurinol 100 Mg Tabs (Allopurinol) .... Take one daily 13)  Fish Oil 1000 Mg Caps (Omega-3 fatty acids) .... Take three daily  Patient Instructions: 1)  Please go to the Lab in the basement for your blood tests today  2)  You will be contacted about the referral(s) to: carotid ultrasound , and diagnostic mammogram 3)  Continue all previous medications as before this visit , including the plavix refill today, and  the insulin 4)  Please schedule a follow-up appointment in 4 months with: 5)  BMP prior to visit, ICD-9: 250.02 6)  Lipid Panel prior to visit, ICD-9: 7)  HbgA1C prior to visit, ICD-9: Prescriptions: PLAVIX 75 MG  TABS (CLOPIDOGREL BISULFATE) TAKE 1 by mouth once daily  #90 x 3   Entered and Authorized by:   Biagio Borg MD   Signed by:   Biagio Borg MD on 07/17/2010   Method used:   Electronically to        [facility] (retail)       [address]       [place], [place]  [address]       Ph: [phone]       Fax: [phone]   RxID:   [id number]    Orders Added: 1)  Radiology Referral [Radiology] 2)  Radiology Referral [Radiology] 3)  T-Prolactin [id number] 4)  T-Testosterone, Free and Total [[id number]] 5)  T-Estradiol [id number] 6)  Est. Patient Level IV [id number]

## 2010-10-31 NOTE — Assessment & Plan Note (Signed)
Summary: pt is "[name]"/per robin/cd   Vital Signs:  Patient profile:   60 year old male Height:      73 inches Weight:      231.25 pounds BMI:     30.62 O2 Sat:      96 % on Room air Temp:     98.2 degrees F oral Pulse rate:   80 / minute BP supine:   98 / 70 BP sitting:   102 / 68  (left arm) BP standing:   96 / 66 Cuff size:   large  Vitals Entered ByShirlean Mylar Ewing (Feb 01, 2010 3:33 PM)  O2 Flow:  Room air CC: Weakness this morning/RE   Primary Care Provider:  Biagio Borg MD  CC:  Weakness this morning/RE.  History of Present Illness: " I just dont have the stamina";  saw neurology for neuropathy this am;  has not been taking a statin for cholesterol due to elevated liver tests and was advised to f/u with his GI (dr Watt Climes);  saw card may 2, with decrease in amlodipine to 5 mg, felt to  be a bit dry as well.    Had recent episode sepsis related to CBD stones, now s/p ERCP with sphinterotomy and stone removal, also klebsiella bacteremia. No pain or recurrent fever in last 2 days;  no other obvious sytpoms such as headache, ST, cough, wheezing, worsening sob, doe,abd pain, GU symtpoms such as pain or freq;  or bleeding or bruising, joint pains, rash (except for bilat knees DJD);  EKG just done mon - declines again today,.   No CP.  Wife says he told her his stomach "did not feel right" recently , but today he states stomach "not so bad" except for gas pains - denies other pain, distension, n/v, bowel change or blood.    Problems Prior to Update: 1)  Transaminases, Serum, Elevated  (ICD-790.4) 2)  Diabetes Mellitus, Type II  (ICD-250.00) 3)  Unspecified Hypotension  (ICD-458.9) 4)  Fatigue  (ICD-780.79) 5)  Hypotension, Unspecified  (ICD-458.9) 6)  Fatigue / Malaise  (ICD-780.79) 7)  Gastroenteritis, Viral  (ICD-008.8) 8)  Preventive Health Care  (ICD-V70.0) 9)  Colonic Polyps, Hx of  (ICD-V12.72) 10)  Depression  (ICD-311) 11)  Benign Prostatic Hypertrophy  (ICD-600.00) 12)   Dementia  (ICD-294.8) 13)  Pre-operative Cardiac Exam  (ICD-V72.81) 14)  Pulmonary Nodule, Right Lower Lobe  (A999333) 15)  Diastolic Heart Failure, Chronic  (ICD-428.32) 16)  Cad, Native Vessel  (ICD-414.01) 17)  Routine General Medical Exam@health  Care Facl  (ICD-V70.0) 18)  Special Screening Malignant Neoplasm of Prostate  (ICD-V76.44) 19)  Dyspnea  (ICD-786.05) 20)  Foot Pain  (ICD-729.5) 21)  Goiter, Multinodular  (ICD-241.1) 22)  Onychomycosis, Toenails  (ICD-110.1) 23)  Dyslipidemia  (ICD-272.4) 24)  Renal Insufficiency  (ICD-588.9) 25)  Peripheral Neuropathy  (ICD-356.9) 26)  Hypertension  (ICD-401.9) 27)  Gout  (ICD-274.9) 28)  Gerd  (ICD-530.81) 29)  Congestive Heart Failure  (ICD-428.0)  Medications Prior to Update: 1)  Allopurinol 300 Mg Tabs (Allopurinol) .... 2 By Mouth Daily 2)  Adult Aspirin Low Strength 81 Mg  Tbdp (Aspirin) .... Take 1 By Mouth Qd 3)  Plavix 75 Mg  Tabs (Clopidogrel Bisulfate) .... Take 1 By Mouth Qd 4)  Isosorbide Mononitrate Cr 60 Mg Xr24h-Tab (Isosorbide Mononitrate) .... Take 1 Tablet By Mouth Once A Day 5)  Celexa 10 Mg Tabs (Citalopram Hydrobromide) .[name].. 1 Tab Once Daily 6)  Rena-Vite  Tabs (B Complex-C-Folic  Acid) .... Take 1 Tablet By Mouth Once A Day 7)  Coreg 25 Mg Tabs (Carvedilol) .... Take 1 Tablet By Mouth Two Times A Day 8)  Amlodipine Besylate 10 Mg Tabs (Amlodipine Besylate) .... Daily 9)  Humulin 70/30 70-30 % Susp (Insulin Isophane & Regular) .... 40 Units Qam 10 Units Qpm 10)  A1c 250.41 11)  Gabapentin 100 Mg Caps (Gabapentin) .... 2 Caps At Bedtime 12)  Epogen 3000 Unit/ml Soln (Epoetin Alfa) .... At Dialysis 13)  Oscal 500/200 D-3 500-200 Mg-Unit Tabs (Calcium-Vitamin D) .... 2 By Mouth 3 Times A Week 14)  Ranitidine Hcl 300 Mg Caps (Ranitidine Hcl) .[name].. 1 By Mouth Daily  Current Medications (verified): 1)  Allopurinol 300 Mg Tabs (Allopurinol) .... 2 By Mouth Daily 2)  Adult Aspirin Low Strength 81 Mg  Tbdp (Aspirin)  .... Take 1 By Mouth Qd 3)  Plavix 75 Mg  Tabs (Clopidogrel Bisulfate) .... Take 1 By Mouth Qd 4)  Isosorbide Mononitrate Cr 60 Mg Xr24h-Tab (Isosorbide Mononitrate) .... Take 1 Tablet By Mouth Once A Day 5)  Celexa 10 Mg Tabs (Citalopram Hydrobromide) .[name].. 1 Tab Once Daily 6)  Rena-Vite  Tabs (B Complex-C-Folic Acid) .... Take 1 Tablet By Mouth Once A Day 7)  Coreg 25 Mg Tabs (Carvedilol) .... Take 1 Tablet By Mouth Two Times A Day 8)  Amlodipine Besylate 5 Mg Tabs (Amlodipine Besylate) .[name].. 1po Once Daily 9)  Humulin 70/30 70-30 % Susp (Insulin Isophane & Regular) .... 40 Units Qam 10 Units Qpm 10)  A1c 250.41 11)  Gabapentin 100 Mg Caps (Gabapentin) .... 2 Caps At Bedtime 12)  Epogen 3000 Unit/ml Soln (Epoetin Alfa) .... At Dialysis 13)  Oscal 500/200 D-3 500-200 Mg-Unit Tabs (Calcium-Vitamin D) .... 2 By Mouth 3 Times A Week 14)  Ranitidine Hcl 300 Mg Caps (Ranitidine Hcl) .[name].. 1 By Mouth Daily  Allergies (verified): 1)  ! Keflex 2)  ! * Statins  Past History:  Past Surgical History: Last updated: 10/29/2008 stent-06/11/08 neck surgery-2/09 back surgery-1998  Social History: Last updated: 10/29/2008 Patient states former smoker. Quit smoking 2007.  Smoked x 25 years 1/2 ppd. Pt is married with children. Pt is disabled.  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 Morbid obesity  2. Diabetes  3. CHF, primarily diastolic      --EF previously 37%      --last echo. 55% with mild TR      --CPX: VO2 12.0. slope of 45. Corrected to ideal body weight VO2 = 18.0      --RHC 2/11: RA 7 RV 36/10/13 PA 26/16 (22) pcwp 2 PA sats 55% and 58% Fick 5.6/2.3  4. ESRD  - dialysis dec 2009  5. OSA on CPAP  6. COPD on nightime O2  7. Myositis with a sed rate of 100       -statin stopped  8. Hyperlipidemia  9.Hypertension 10.Gout 12. Diabetes     --c/b peripheral neurpoathy 13. GERD 14. Elevated R  hemidiaphragm 15. Chronic dyspnea, multifactorial  ROUTINE GENERAL MEDICAL EXAM@HEALTH  CARE FACL (ICD-V70.0) SPECIAL SCREENING MALIGNANT NEOPLASM OF PROSTATE (ICD-V76.44) FOOT PAIN (ICD-729.5) GOITER, MULTINODULAR (ICD-241.1) ONYCHOMYCOSIS, TOENAILS (ICD-110.1) Benign prostatic hypertrophy Depression GERD cervical stenosis Colonic polyps, hx of Bipolar disease Gout Dementia Left knee djd - severe, end stage Diabetes mellitus, type II  Review of Systems       all otherwise negative per pt -    Physical Exam  General:  alert and overweight-appearing, not ill appearing but fatigued, but abe to get up on exam table, lie down and sit up without seeming difficulty, dizziness or assist.   Head:  normocephalic and atraumatic.   Eyes:  vision grossly intact, pupils equal, and pupils round.   Ears:  R ear normal and L ear normal.   Nose:  no external deformity and no nasal discharge.   Mouth:  no gingival abnormalities and pharynx pink and moist.   Neck:  supple and no masses.   Lungs:  normal respiratory effort and normal breath sounds.   Heart:  normal rate and regular rhythm.   Abdomen:  soft, non-tender, and normal bowel sounds.   Msk:  no joint tenderness and no joint swelling.  ; although has marked right knee crepitus Extremities:  no edema, no erythema  Neurologic:  strength normal in all extremities and gait normal.     Impression & Recommendations:  Problem # 1:  FATIGUE (ICD-780.79) exam benign, to check labs below; follow with expectant management  Orders: TLB-BMP (Basic Metabolic Panel-BMET) (99991111) TLB-Hepatic/Liver Function Pnl (80076-HEPATIC) TLB-TSH (Thyroid Stimulating Hormone) (84443-TSH)  Problem # 2:  UNSPECIFIED HYPOTENSION (ICD-458.9) I think probably the primary cause of above, exam o/w benign, not orthostatic by BPs today, but will d/c the amlodipine completely  Problem # 3:  DIABETES MELLITUS, TYPE II (ICD-250.00)  His updated medication  list for this problem includes:    Adult Aspirin Low Strength 81 Mg Tbdp (Aspirin) .[name]... Take 1 by mouth qd    Humulin 70/30 70-30 % Susp (Insulin isophane & regular) .Marland KitchenMarland KitchenMarland KitchenMarland Kitchen 40 units qam 10 units qpm to also check cbg's more closely the next 3 days and call with results, but suspect low BP more likely cause of weakness and fatigue  Problem # 4:  TRANSAMINASES, SERUM, ELEVATED (ICD-790.4) recetn, with recent sepsis, klebsiella bactermia, CBD stones now removed per ERCP -  to check f/u LFT's and fax to Dr Watt Climes per pt's wife request  Complete Medication List: 1)  Allopurinol 300 Mg Tabs (Allopurinol) .... 2 by mouth daily 2)  Adult Aspirin Low Strength 81 Mg Tbdp (Aspirin) .... Take 1 by mouth qd 3)  Plavix 75 Mg Tabs (Clopidogrel bisulfate) .... Take 1 by mouth qd 4)  Isosorbide Mononitrate Cr 60 Mg Xr24h-tab (Isosorbide mononitrate) .... Take 1 tablet by mouth once a day 5)  Celexa 10 Mg Tabs (Citalopram hydrobromide) .[name].. 1 tab once daily 6)  Rena-vite Tabs (B complex-c-folic acid) .... Take 1 tablet by mouth once a day 7)  Coreg 25 Mg Tabs (Carvedilol) .... Take 1 tablet by mouth two times a day 8)  Amlodipine Besylate 5 Mg Tabs (Amlodipine besylate) .[name].. 1po once daily 9)  Humulin 70/30 70-30 % Susp (Insulin isophane & regular) .... 40 units qam 10 units qpm 10)  A1c 250.41  11)  Gabapentin 100 Mg Caps (Gabapentin) .... 2 caps at bedtime 12)  Epogen 3000 Unit/ml Soln (Epoetin alfa) .... At dialysis 13)  Oscal 500/200 D-3 500-200 Mg-unit Tabs (Calcium-vitamin d) .... 2 by mouth 3 times a week 14)  Ranitidine Hcl 300 Mg Caps (Ranitidine hcl) .[name].. 1 by mouth daily  Patient Instructions: 1)  stop the amlodipine 2)  please check your sugars for the next 3 days at least 3 times per day, and call with results if low (less than 100) 3)  Please go to the Lab in the basement for your blood and/or urine tests today  4)  We will fax  your tests to Dr Watt Climes as well for your appt coming up with him  May 23 5)  Continue all previous medications as before this visit  6)  please keep your appt with Dr Reola Mosher as planned, and your dialysis sessions as you do 7)  Please schedule a follow-up appointment in 3 months, or sooner if needed

## 2010-10-31 NOTE — Progress Notes (Signed)
Summary: Physical Therapy  Phone Note From Other Clinic   Summary of Call: Beth from [facility] called asking for 2 more weeks of Physical Therapy? Please advise.  Call back number [phone]. Initial call taken by: Gardenia Phlegm RMA,  Feb 10, 2010 3:32 PM  Follow-up for Phone Call        ok for verbal Follow-up by: Biagio Borg MD,  Feb 10, 2010 4:54 PM  Additional Follow-up for Phone Call Additional follow up Details #1::        verbal give to RN [facility] Additional Follow-up by: Crissie Sickles, [place],  Feb 13, 2010 11:49 AM

## 2010-10-31 NOTE — Assessment & Plan Note (Signed)
Summary: 3 MTH FU  STC   Vital Signs:  Patient profile:   60 year old male Height:      73 inches Weight:      238 pounds BMI:     31.51 O2 Sat:      97 % on Room air Temp:     98 degrees F oral Pulse rate:   84 / minute BP sitting:   96 / 54  (left arm) Cuff size:   large  Vitals Entered By: Shirlean Mylar Ewing CMA ([place]) (May 05, 2010 1:48 PM)  O2 Flow:  Room air  Preventive Care Screening     declines tetanus   CC: 3 month ROV/RE   Primary Care Provider:  Biagio Borg MD  CC:  3 month ROV/RE.  History of Present Illness: overall doing well, Pt denies CP, sob, doe, wheezing, orthopnea, pnd, worsening LE edema, palps, or syncope but still with several episodes since last seen with low pressure and near syncope.  Pt denies new neuro symptoms such as headache, facial or extremity weakness  Pt denies polydipsia, polyuria, or low sugar symptoms such as shakiness improved with eating.  Overall good compliance with meds, trying to follow low chol, DM diet, wt stable, little excercise however  Sees Dr Loanne Drilling soon for thyroid dz.    Preventive Screening-Counseling & Management      Drug Use:  no.    Problems Prior to Update: 1)  Chest Pain  (ICD-786.50) 2)  Gait Disturbance  (ICD-781.2) 3)  Unspecified Hypotension  (ICD-458.9) 4)  Hyperthyroidism  (ICD-242.90) 5)  Transaminases, Serum, Elevated  (ICD-790.4) 6)  Diabetes Mellitus, Type II  (ICD-250.00) 7)  Unspecified Hypotension  (ICD-458.9) 8)  Fatigue  (ICD-780.79) 9)  Hypotension, Unspecified  (ICD-458.9) 10)  Fatigue / Malaise  (ICD-780.79) 11)  Gastroenteritis, Viral  (ICD-008.8) 12)  Preventive Health Care  (ICD-V70.0) 13)  Colonic Polyps, Hx of  (ICD-V12.72) 14)  Depression  (ICD-311) 15)  Benign Prostatic Hypertrophy  (ICD-600.00) 16)  Dementia  (ICD-294.8) 17)  Pre-operative Cardiac Exam  (ICD-V72.81) 18)  Pulmonary Nodule, Right Lower Lobe  (A999333) 19)  Diastolic Heart Failure, Chronic  (ICD-428.32) 20)   Cad, Native Vessel  (ICD-414.01) 21)  Routine General Medical Exam@health  Care Facl  (ICD-V70.0) 22)  Special Screening Malignant Neoplasm of Prostate  (ICD-V76.44) 23)  Dyspnea  (ICD-786.05) 24)  Foot Pain  (ICD-729.5) 25)  Goiter, Multinodular  (ICD-241.1) 26)  Onychomycosis, Toenails  (ICD-110.1) 27)  Dyslipidemia  (ICD-272.4) 28)  Renal Insufficiency  (ICD-588.9) 29)  Peripheral Neuropathy  (ICD-356.9) 30)  Hypertension  (ICD-401.9) 31)  Gout  (ICD-274.9) 32)  Gerd  (ICD-530.81) 33)  Congestive Heart Failure  (ICD-428.0)  Medications Prior to Update: 1)  Allopurinol 300 Mg Tabs (Allopurinol) .... 2 By Mouth Daily 2)  Adult Aspirin Low Strength 81 Mg  Tbdp (Aspirin) .... Take 1 By Mouth Qd 3)  Plavix 75 Mg  Tabs (Clopidogrel Bisulfate) .... Take 1 By Mouth Qd 4)  Isosorbide Mononitrate Cr 60 Mg Xr24h-Tab (Isosorbide Mononitrate) .... Take 1 Tablet By Mouth Once A Day 5)  Celexa 10 Mg Tabs (Citalopram Hydrobromide) .[name].. 1 Tab Once Daily 6)  Rena-Vite  Tabs (B Complex-C-Folic Acid) .... Take 1 Tablet By Mouth Once A Day 7)  Coreg 25 Mg Tabs (Carvedilol) .... Take 1 Tablet By Mouth Two Times A Day 8)  Humulin 70/30 70-30 % Susp (Insulin Isophane & Regular) .... 30 Units Qam 50 Units Qpm 9)  A1c 250.41  10)  Gabapentin 100 Mg Caps (Gabapentin) .... 2 Caps At Bedtime 11)  Epogen 3000 Unit/ml Soln (Epoetin Alfa) .... At Dialysis 12)  Oscal 500/200 D-3 500-200 Mg-Unit Tabs (Calcium-Vitamin D) .... 2 By Mouth 3 Times A Week 13)  Ranitidine Hcl 300 Mg Caps (Ranitidine Hcl) .[name].. 1 By Mouth Daily  Current Medications (verified): 1)  Carvedilol 12.5 Mg Tabs (Carvedilol) .[name].. 1po Two Times A Day 2)  Adult Aspirin Low Strength 81 Mg  Tbdp (Aspirin) .... Take 1 By Mouth Qd 3)  Plavix 75 Mg  Tabs (Clopidogrel Bisulfate) .... Take 1 By Mouth Qd 4)  Isosorbide Mononitrate Cr 60 Mg Xr24h-Tab (Isosorbide Mononitrate) .... Take 1 Tablet By Mouth Once A Day 5)  Celexa 10 Mg Tabs (Citalopram  Hydrobromide) .[name].. 1 Tab Once Daily 6)  Rena-Vite  Tabs (B Complex-C-Folic Acid) .... Take 1 Tablet By Mouth Once A Day 7)  Coreg 25 Mg Tabs (Carvedilol) .... Take 1 Tablet By Mouth Two Times A Day 8)  Humulin 70/30 70-30 % Susp (Insulin Isophane & Regular) .... 30 Units Qam 50 Units Qpm 9)  A1c 250.41 10)  Gabapentin 100 Mg Caps (Gabapentin) .... 2 Caps At Bedtime 11)  Epogen 3000 Unit/ml Soln (Epoetin Alfa) .... At Dialysis 12)  Oscal 500/200 D-3 500-200 Mg-Unit Tabs (Calcium-Vitamin D) .... 2 By Mouth 3 Times A Week 13)  Ranitidine Hcl 300 Mg Caps (Ranitidine Hcl) .[name].. 1 By Mouth Daily 14)  Sensipar 90 Mg Tabs (Cinacalcet Hcl) .[name].. 1 By Mouth Once Daily 15)  Welchol 3.75 Gm Pack (Colesevelam Hcl) .[name].. 1 Pack By Mouth in Water Once Daily  Allergies (verified): 1)  ! Keflex 2)  ! * Statins  Past History:  Past Medical History: Last updated: 02/01/2010  1. Morbid obesity  2. Diabetes  3. CHF, primarily diastolic      --EF previously 37%      --last echo. 55% with mild TR      --CPX: VO2 12.0. slope of 45. Corrected to ideal body weight VO2 = 18.0      --RHC 2/11: RA 7 RV 36/10/13 PA 26/16 (22) pcwp 2 PA sats 55% and 58% Fick 5.6/2.3  4. ESRD  - dialysis dec 2009  5. OSA on CPAP  6. COPD on nightime O2  7. Myositis with a sed rate of 100       -statin stopped  8. Hyperlipidemia  9.Hypertension 10.Gout 12. Diabetes     --c/b peripheral neurpoathy 13. GERD 14. Elevated R hemidiaphragm 15. Chronic dyspnea, multifactorial  ROUTINE GENERAL MEDICAL EXAM@HEALTH  CARE FACL (ICD-V70.0) SPECIAL SCREENING MALIGNANT NEOPLASM OF PROSTATE (ICD-V76.44) FOOT PAIN (ICD-729.5) GOITER, MULTINODULAR (ICD-241.1) ONYCHOMYCOSIS, TOENAILS (ICD-110.1) Benign prostatic hypertrophy Depression GERD cervical stenosis Colonic polyps, hx of Bipolar disease Gout Dementia Left knee djd - severe, end stage Diabetes mellitus, type II  Past Surgical History: Last updated:  10/29/2008 stent-06/11/08 neck surgery-2/09 back surgery-1998  Family History: Last updated: 10/29/2008 sister has "thyroid lumps" father-heart disease sister-heart disease  Social History: Last updated: 05/05/2010 Patient states former smoker. Quit smoking 2007.  Smoked x 25 years 1/2 ppd. Pt is married with children. Pt is disabled.  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 Patient states former smoker. Quit smoking 2007.  Smoked x 25 years 1/2 ppd. Pt is married with children. Pt is disabled.  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         all otherwise negative per pt -    Physical Exam  General:  alert and overweight-appearing.   Head:  normocephalic and atraumatic.   Eyes:  vision grossly intact, pupils equal, and pupils round.   Ears:  R ear normal and L ear normal.   Nose:  no external deformity and no nasal discharge.   Mouth:  no gingival abnormalities and pharynx pink and moist.   Neck:  supple and no masses.   Lungs:  normal respiratory effort and normal breath sounds.   Heart:  normal rate and regular rhythm.   Abdomen:  soft, non-tender, and normal bowel sounds.   Msk:  no joint tenderness and no joint swelling.   Extremities:  no edema, no erythema  Neurologic:  cranial nerves II-XII intact and strength normal in all extremities.    Skin:  color normal and no rashes.   Psych:  good eye contact, not anxious appearing, and not depressed appearing.     Impression & Recommendations:  Problem # 1:  Preventive Health Care (ICD-V70.0)  Overall doing well, age appropriate education and counseling updated and referral for appropriate preventive services done unless declined, immunizations up to date or declined, diet counseling done if overweight, urged to quit smoking if smokes , most recent labs reviewed and current ordered if appropriate, ecg reviewed or declined (interpretation per ECG scanned in the EMR if done); information regarding Medicare Prevention requirements given if appropriate; speciality referrals updated as appropriate   Orders: TLB-BMP (Basic Metabolic Panel-BMET) (99991111) TLB-CBC Platelet - w/Differential (85025-CBCD) TLB-Hepatic/Liver Function Pnl (80076-HEPATIC) TLB-Lipid Panel (80061-LIPID) TLB-Udip ONLY (81003-UDIP) TLB-PSA (Prostate Specific Antigen) (84153-PSA)  Problem # 2:  HYPOTENSION, UNSPECIFIED (ICD-458.9) ok to decrease the coreg to 12.5 two times a day   Problem # 3:  DIABETES MELLITUS, TYPE II (ICD-250.00)  His updated medication list for this problem includes:    Adult Aspirin Low Strength 81 Mg Tbdp (Aspirin) .[name]... Take 1 by mouth qd    Humulin 70/30 70-30 % Susp (Insulin isophane & regular) .Marland KitchenMarland KitchenMarland KitchenMarland Kitchen 30 units qam 50 units qpm  Labs Reviewed: Creat: 7.3 (02/01/2010)    Reviewed HgBA1c results: 5.4 (02/06/2010)  7.2 (08/12/2008) stable overall by hx and exam, ok to continue meds/tx as is   Problem # 4:  DYSLIPIDEMIA (ICD-272.4)  Labs Reviewed: SGOT: 30 (02/01/2010)   SGPT: 42 (02/01/2010) ok for welchol , Pt to continue diet efforts, good med tolerance; to check labs - goal LDL less than 70   His updated medication list for this problem includes:    Welchol 3.75 Gm Pack (Colesevelam hcl) .[name]... 1 pack by mouth in water once daily  Complete Medication List: 1)  Carvedilol  12.5 Mg Tabs (Carvedilol) .[name].. 1po two times a day 2)  Adult Aspirin Low Strength 81 Mg Tbdp (Aspirin) .... Take 1 by mouth qd 3)  Plavix 75 Mg Tabs (Clopidogrel bisulfate) .... Take 1 by mouth qd 4)  Isosorbide Mononitrate Cr 60 Mg Xr24h-tab (Isosorbide mononitrate) .... Take 1 tablet by mouth once a day 5)  Celexa 10 Mg Tabs (Citalopram hydrobromide) .[name].. 1 tab once daily 6)  Rena-vite Tabs (B  complex-c-folic acid) .... Take 1 tablet by mouth once a day 7)  Coreg 25 Mg Tabs (Carvedilol) .... Take 1 tablet by mouth two times a day 8)  Humulin 70/30 70-30 % Susp (Insulin isophane & regular) .... 30 units qam 50 units qpm 9)  A1c 250.41  10)  Gabapentin 100 Mg Caps (Gabapentin) .... 2 caps at bedtime 11)  Epogen 3000 Unit/ml Soln (Epoetin alfa) .... At dialysis 12)  Oscal 500/200 D-3 500-200 Mg-unit Tabs (Calcium-vitamin d) .... 2 by mouth 3 times a week 13)  Ranitidine Hcl 300 Mg Caps (Ranitidine hcl) .[name].. 1 by mouth daily 14)  Sensipar 90 Mg Tabs (Cinacalcet hcl) .[name].. 1 by mouth once daily 15)  Welchol 3.75 Gm Pack (Colesevelam hcl) .[name].. 1 pack by mouth in water once daily  Patient Instructions: 1)  decrease the carvedilol to 12.5 mg twice per day 2)  start the welchol  - 1 packet per day 3)  Your prescriptions were sent to your pharmacy on the computer 4)  Please go to the Lab in the basement for your blood and/or urine tests today  5)  Please call the number on the [facility] for results of your testing  6)  Please keep your appt with Dr Hale Bogus as planned 7)  Please schedule a follow-up appointment in 6 months with : 8)  BMP prior to visit, ICD-9: 250.02 9)  Lipid Panel prior to visit, ICD-9: 10)  HbgA1C prior to visit, ICD-9: Prescriptions: WELCHOL 3.75 GM PACK (COLESEVELAM HCL) 1 pack by mouth in water once daily  #30 x 11   Entered and Authorized by:   Biagio Borg MD   Signed by:   Biagio Borg MD on 05/05/2010   Method used:   Electronically to        [facility]  (retail)       [address]       [place], [place]  [address]       Ph: [phone]       Fax: [phone]   RxIDQK:[id number] CARVEDILOL 12.5 MG TABS (CARVEDILOL) 1po two times a day  #180 x 3   Entered and Authorized by:   Biagio Borg MD   Signed by:   Biagio Borg MD on 05/05/2010   Method used:   Electronically to        [facility] (retail)       [address]       [place], [place]  [address]       Ph: [phone]       Fax: [phone]   RxID:   [id number]

## 2010-10-31 NOTE — Progress Notes (Signed)
Summary:  [facility]  Phone Note Outgoing Call   Summary of Call: Faxed completed paperwork to [facility] and sent a copy to be scanned. Initial call taken by: Gardenia Phlegm RMA,  January 26, 2010 10:42 AM

## 2010-10-31 NOTE — Progress Notes (Signed)
Summary: Rx refill req  Phone Note Refill Request Message from:  Patient on July 19, 2010 4:46 PM  Refills Requested: Medication #1:  PLAVIX 75 MG  TABS TAKE 1 by mouth once daily   Dosage confirmed as above?Dosage Confirmed   Supply Requested: 1 year  Method Requested: Electronic Initial call taken by: Crissie Sickles, [place],  July 19, 2010 4:47 PM    Prescriptions: PLAVIX 75 MG  TABS (CLOPIDOGREL BISULFATE) TAKE 1 by mouth once daily  #09 x 3   Entered by:   Crissie Sickles, CMA   Authorized by:   Biagio Borg MD   Signed by:   Crissie Sickles, CMA on 07/19/2010   Method used:   Faxed to ...       [facility] (mail-order)             , [place]         Ph: [phone]       Fax: [phone]   RxID:   [id number]

## 2010-10-31 NOTE — Progress Notes (Signed)
Summary: REFILLS  Phone Note Call from Patient Call back at Home Phone [phone]   Caller: wife Summary of Call: PT NEEDS RX'S SENT TO [facility] WITH A COVER SHEET. HIS ID# IS [id number]  FAX # [phone].  HE NEEDS THREE MO SUPPLIES OF WELCHOL 3.75 GM PACK, 1 PK DAILY. CARVEDILOL 12.5 M  2X PER DAY. PT'S [id number] CELL [id number] Initial call taken by: Glena Norfolk,  September 06, 2010 9:56 AM  Follow-up for Phone Call        Welchol removed from med list 06/07/2010 by Dr [name], please advise? Pt has continued to take meds. Follow-up by: Crissie Sickles, CMA,  September 06, 2010 10:59 AM  Additional Follow-up for Phone Call Additional follow up Details #1::        please inform pt, welchol apparetnly no longer needed per cardiology  ok for the rest Additional Follow-up by: Biagio Borg MD,  September 06, 2010 11:00 AM    Additional Follow-up for Phone Call Additional follow up Details #2::    called pt informed of above information. Faxed prescription of Carvedilol to pharmacy as pt. requested. Follow-up by: Shirlean Mylar Ewing CMA Deborra Medina),  September 06, 2010 12:03 PM  Prescriptions: CARVEDILOL 12.5 MG TABS (CARVEDILOL) 1po two times a day  #180 x 3   Entered by:   Sharon Seller CMA ([place])   Authorized by:   Biagio Borg MD   Signed by:   Sharon Seller CMA ([place]) on 09/06/2010   Method used:   Faxed to ...       [facility] (mail-order)             , [place]         Ph: [phone]       Fax: [phone]   RxID:   [id number]

## 2010-10-31 NOTE — Miscellaneous (Signed)
Summary: PT Discharge/[place]  PT Discharge/[place]   Imported By: Phillis Knack 06/29/2010 12:11:19  _____________________________________________________________________  External Attachment:    Type:   Image     Comment:   External Document

## 2010-10-31 NOTE — Letter (Signed)
Summary: [facility]   Imported By: Phillis Knack 02/23/2010 11:54:39  _____________________________________________________________________  External Attachment:    Type:   Image     Comment:   External Document

## 2010-10-31 NOTE — Letter (Signed)
Summary: [facility]   Imported By: Bubba Hales 05/05/2010 12:57:58  _____________________________________________________________________  External Attachment:    Type:   Image     Comment:   External Document

## 2010-10-31 NOTE — Progress Notes (Signed)
Summary: Physical therapy  Phone Note From Other Clinic   Caller[facility] Request: Talk with Provider Summary of Call: Left msg on voice mail requesting a referral for out pateint physical therapy. Pls contact pt with referral info. Initial call taken by: Tomma Lightning,  March 03, 2010 12:47 PM  Follow-up for Phone Call        ok for PT referral - [facility] Follow-up by: Biagio Borg MD,  March 03, 2010 12:59 PM  Additional Follow-up for Phone Call Additional follow up Details #1::        Notified pt spoke with his wife. Pt wife is req it to be set up at [facility] location Additional Follow-up by: Tomma Lightning,  March 03, 2010 1:27 PM  New Problems: GAIT DISTURBANCE (ICD-781.2)   New Problems: GAIT DISTURBANCE (ICD-781.2)

## 2010-10-31 NOTE — Assessment & Plan Note (Signed)
Summary: neck pain  stiffness---stc   Vital Signs:  Patient profile:   60 year old male Height:      73 inches Weight:      244.25 pounds BMI:     32.34 O2 Sat:      92 % on Room air Temp:     97.9 degrees F oral Pulse rate:   85 / minute BP sitting:   100 / 70  (left arm) Cuff size:   large  Vitals Entered By: Shirlean Mylar Ewing CMA Deborra Medina) (July 31, 2010 3:28 PM)  O2 Flow:  Room air CC: Neck pain for 3 days/RE   Primary Care Provider:  Biagio Borg MD  CC:  Neck pain for 3 days/RE.  History of Present Illness: here with acute c/o bilat neck pain and tenderness , stiffness, throbbing, midl to mod, without radiation to the shoulders or UE's; but worse to moving the head horizontally left and right;  is s/p cervical fusion in the past,  no fever, wt loss, bowel or bladder change, gait change, injury or fall.  Pt denies CP, worsening sob, doe, wheezing, orthopnea, pnd, worsening LE edema, palps, dizziness or syncope  Pt denies new neuro symptoms such as headache, facial or extremity weakness  No fever, wt loss, night sweats, loss of appetite or other constitutional symptoms Pt denies polydipsia, polyuria, or low sugar symptoms such as shakiness improved with eating.  Overall good compliance with meds, trying to follow low chol, DM diet, wt stable, little excercise however Denies worsening depressive symptoms, suicidal ideation, or panic.   Pt dementia remains at least mild to mod. Gynecomatia discomfort now resolved, recent mammogram without mass, hormone eval shows relative mild incrased estrogen.   Problems Prior to Update: 1)  Neck Pain  (ICD-723.1) 2)  Dizziness  (ICD-780.4) 3)  Gynecomastia  (ICD-611.1) 4)  Hypotension, Unspecified  (ICD-458.9) 5)  Hypocalcemia  (ICD-275.41) 6)  Preventive Health Care  (ICD-V70.0) 7)  Chest Pain  (ICD-786.50) 8)  Gait Disturbance  (ICD-781.2) 9)  Unspecified Hypotension  (ICD-458.9) 10)  Hyperthyroidism  (ICD-242.90) 11)  Transaminases, Serum,  Elevated  (ICD-790.4) 12)  Diabetes Mellitus, Type II  (ICD-250.00) 13)  Unspecified Hypotension  (ICD-458.9) 14)  Fatigue  (ICD-780.79) 15)  Hypotension, Unspecified  (ICD-458.9) 16)  Fatigue / Malaise  (ICD-780.79) 17)  Gastroenteritis, Viral  (ICD-008.8) 18)  Preventive Health Care  (ICD-V70.0) 19)  Colonic Polyps, Hx of  (ICD-V12.72) 20)  Depression  (ICD-311) 21)  Benign Prostatic Hypertrophy  (ICD-600.00) 22)  Dementia  (ICD-294.8) 23)  Pre-operative Cardiac Exam  (ICD-V72.81) 24)  Pulmonary Nodule, Right Lower Lobe  ([id number]) 25)  Diastolic Heart Failure, Chronic  (ICD-428.32) 26)  Cad, Native Vessel  (ICD-414.01) 27)  Routine General Medical Exam@health  Care Facl  (ICD-V70.0) 28)  Special Screening Malignant Neoplasm of Prostate  (ICD-V76.44) 29)  Dyspnea  (ICD-786.05) 30)  Foot Pain  (ICD-729.5) 31)  Goiter, Multinodular  (ICD-241.1) 32)  Onychomycosis, Toenails  (ICD-110.1) 33)  Dyslipidemia  (ICD-272.4) 34)  Renal Insufficiency  (ICD-588.9) 35)  Peripheral Neuropathy  (ICD-356.9) 36)  Hypertension  (ICD-401.9) 37)  Gout  (ICD-274.9) 38)  Gerd  (ICD-530.81) 39)  Congestive Heart Failure  (ICD-428.0)  Medications Prior to Update: 1)  Carvedilol 12.5 Mg Tabs (Carvedilol) .[name].. 1po Two Times A Day 2)  Adult Aspirin Low Strength 81 Mg  Tbdp (Aspirin) .... Take 1 By Mouth Qd 3)  Plavix 75 Mg  Tabs (Clopidogrel Bisulfate) .... Take 1 By Mouth Once Daily 4)  Rena-Vite  Tabs (B Complex-C-Folic Acid) .... Take 1 Tablet By Mouth Once A Day 5)  Humulin 70/30 70-30 % Susp (Insulin Isophane & Regular) .... 30 Units Qam 5 Units Qpm 6)  A1c 250.41 7)  Gabapentin 300 Mg Caps (Gabapentin) .... Take One At Bedtine 8)  Epogen 3000 Unit/ml Soln (Epoetin Alfa) .... At Dialysis 9)  Oscal 500/200 D-3 500-200 Mg-Unit Tabs (Calcium-Vitamin D) .... 2 By Mouth 3 Times A Week 10)  Ranitidine Hcl 300 Mg Caps (Ranitidine Hcl) .[name].. 1 By Mouth Daily 11)  Sensipar 90 Mg Tabs (Cinacalcet Hcl) .[name]..  1 By Mouth Once Daily 12)  Allopurinol 100 Mg Tabs (Allopurinol) .... Take One Daily 13)  Fish Oil 1000 Mg Caps (Omega-3 Fatty Acids) .... Take Three Daily  Current Medications (verified): 1)  Carvedilol 12.5 Mg Tabs (Carvedilol) .[name].. 1po Two Times A Day 2)  Adult Aspirin Low Strength 81 Mg  Tbdp (Aspirin) .... Take 1 By Mouth Qd 3)  Plavix 75 Mg  Tabs (Clopidogrel Bisulfate) .... Take 1 By Mouth Once Daily 4)  Rena-Vite  Tabs (B Complex-C-Folic Acid) .... Take 1 Tablet By Mouth Once A Day 5)  Humulin 70/30 70-30 % Susp (Insulin Isophane & Regular) .... 30 Units Qam 5 Units Qpm 6)  A1c 250.41 7)  Gabapentin 300 Mg Caps (Gabapentin) .... Take One At Bedtine 8)  Epogen 3000 Unit/ml Soln (Epoetin Alfa) .... At Dialysis 9)  Oscal 500/200 D-3 500-200 Mg-Unit Tabs (Calcium-Vitamin D) .... 2 By Mouth 3 Times A Week 10)  Ranitidine Hcl 300 Mg Caps (Ranitidine Hcl) .[name].. 1 By Mouth Daily 11)  Sensipar 90 Mg Tabs (Cinacalcet Hcl) .[name].. 1 By Mouth Once Daily 12)  Allopurinol 100 Mg Tabs (Allopurinol) .... Take One Daily 13)  Fish Oil 1000 Mg Caps (Omega-3 Fatty Acids) .... Take Three Daily 14)  Flexeril 5 Mg Tabs (Cyclobenzaprine Hcl) .[name].. 1 By Mouth Three Times A Day As Needed 15)  Tramadol Hcl 50 Mg Tabs (Tramadol Hcl) .[name].. 1  By Mouth Q 6 Hrs As Needed 16)  Prednisone 10 Mg Tabs (Prednisone) .... 3po Qd For 3days, Then 2po Qd For 3days, Then 1po Qd For 3days, Then Stop  Allergies (verified): 1)  ! Keflex 2)  ! * Statins  Past History:  Past Medical History: Last updated: 07/17/2010  1. Morbid obesity  2. Diabetes  3. CHF, primarily diastolic      --EF previously 37%      --last echo. 55% with mild TR      --CPX: VO2 12.0. slope of 45. Corrected to ideal body weight VO2 = 18.0      --RHC 2/11: RA 7 RV 36/10/13 PA 26/16 (22) pcwp 2 PA sats 55% and 58% Fick 5.6/2.3  4. ESRD  - dialysis dec 2009  5. OSA on CPAP  6. COPD on nightime O2  7. Myositis with a sed rate of 100       -statin  stopped  8. Hyperlipidemia  9.Hypertension 10.Gout 12. Diabetes     --c/b peripheral neurpoathy 13. GERD 14. Elevated R hemidiaphragm 15. Chronic dyspnea, multifactorial  ROUTINE GENERAL MEDICAL EXAM@HEALTH  CARE FACL (ICD-V70.0) SPECIAL SCREENING MALIGNANT NEOPLASM OF PROSTATE (ICD-V76.44) FOOT PAIN (ICD-729.5) GOITER, MULTINODULAR (ICD-241.1) ONYCHOMYCOSIS, TOENAILS (ICD-110.1) Benign prostatic hypertrophy Depression GERD cervical stenosis Colonic polyps, hx of Bipolar disease Gout Dementia Left knee djd - severe, end stage Diabetes mellitus, type II  Past Surgical History: Last updated: 10/29/2008 stent-06/11/08 neck surgery-2/09 back surgery-1998  Social History: Last updated:  05/05/2010 Patient states former smoker. Quit smoking 2007.  Smoked x 25 years 1/2 ppd. Pt is married with children. Pt is disabled.  Formerly Lawyer for [employer]. Drug use-no  Risk Factors: Smoking Status: quit (06/18/2007)  Review of Systems       all otherwise negative per pt -    Physical Exam  General:  alert and overweight-appearing.   Head:  normocephalic and atraumatic.   Eyes:  vision grossly intact, pupils equal, and pupils round.   Ears:  R ear normal and L ear normal.   Nose:  no external deformity and no nasal discharge.   Mouth:  no gingival abnormalities and pharynx pink and moist.   Neck:  supple and no masses.   Lungs:  normal respiratory effort and normal breath sounds.   Heart:  normal rate and regular rhythm.   Msk:  bilat post para - cervical tenderness without sweling or range;  spine nontender Extremities:  no edema, no erythema  Neurologic:  strength normal in all extremities, sensation intact to light touch, and gait normal.     Impression & Recommendations:  Problem # 1:  NECK PAIN (ICD-723.1)  His updated medication list for this problem includes:    Adult Aspirin Low Strength 81 Mg Tbdp (Aspirin) .[name]... Take 1 by mouth qd    Flexeril 5  Mg Tabs (Cyclobenzaprine hcl) .[name]... 1 by mouth three times a day as needed    Tramadol Hcl 50 Mg Tabs (Tramadol hcl) .[name]... 1  by mouth q 6 hrs as needed c/w MSK strain bilat vs flare of underlying DJD/DDD - for predpack, tramadol, and as needed flexeril   Problem # 2:  DIABETES MELLITUS, TYPE II (ICD-250.00)  His updated medication list for this problem includes:    Adult Aspirin Low Strength 81 Mg Tbdp (Aspirin) .[name]... Take 1 by mouth qd    Humulin 70/30 70-30 % Susp (Insulin isophane & regular) .Marland KitchenMarland KitchenMarland KitchenMarland Kitchen 30 units qam 5 units qpm  Labs Reviewed: Creat: 9.9 (05/05/2010)    Reviewed HgBA1c results: 7.1 (05/08/2010)  5.4 (02/06/2010) stable overall by hx and exam, ok to continue meds/tx as is   Problem # 3:  HYPERTENSION (ICD-401.9)  His updated medication list for this problem includes:    Carvedilol 12.5 Mg Tabs (Carvedilol) .[name]... 1po two times a day  BP today: 100/70 Prior BP: 120/72 (07/17/2010)  Labs Reviewed: K+: 4.8 (05/05/2010) Creat: : 9.9 (05/05/2010)   Chol: 450 (05/05/2010)   HDL: 56.40 (05/05/2010)   TG: 323.0 (05/05/2010) stable overall by hx and exam, ok to continue meds/tx as is   Complete Medication List: 1)  Carvedilol 12.5 Mg Tabs (Carvedilol) .[name].. 1po two times a day 2)  Adult Aspirin Low Strength 81 Mg Tbdp (Aspirin) .... Take 1 by mouth qd 3)  Plavix 75 Mg Tabs (Clopidogrel bisulfate) .... Take 1 by mouth once daily 4)  Rena-vite Tabs (B complex-c-folic acid) .... Take 1 tablet by mouth once a day 5)  Humulin 70/30 70-30 % Susp (Insulin isophane & regular) .... 30 units qam 5 units qpm 6)  A1c 250.41  7)  Gabapentin 300 Mg Caps (Gabapentin) .... Take one at bedtine 8)  Epogen 3000 Unit/ml Soln (Epoetin alfa) .... At dialysis 9)  Oscal 500/200 D-3 500-200 Mg-unit Tabs (Calcium-vitamin d) .... 2 by mouth 3 times a week 10)  Ranitidine Hcl 300 Mg Caps (Ranitidine hcl) .[name].. 1 by mouth daily 11)  Sensipar 90 Mg Tabs (Cinacalcet hcl) .[name].. 1 by mouth once  daily 12)   Allopurinol 100 Mg Tabs (Allopurinol) .... Take one daily 13)  Fish Oil 1000 Mg Caps (Omega-3 fatty acids) .... Take three daily 14)  Flexeril 5 Mg Tabs (Cyclobenzaprine hcl) .[name].. 1 by mouth three times a day as needed 15)  Tramadol Hcl 50 Mg Tabs (Tramadol hcl) .[name].. 1  by mouth q 6 hrs as needed 16)  Prednisone 10 Mg Tabs (Prednisone) .... 3po qd for 3days, then 2po qd for 3days, then 1po qd for 3days, then stop  Patient Instructions: 1)  Please take all new medications as prescribed  - the tramadol pain medicine, prednisone, and flexeril muscle relaxer  2)  Continue all previous medications as before this visit 3)  Please keep your appt Friday with Dr Loanne Drilling, and the kidney specialist as you have planned 4)  Remember your blood sugars may be elevated some with the prednisone, but should return to the previous when the prednisone is done 5)  Please schedule a follow-up appointment in 3 months with: 6)  BMP prior to visit, ICD-9: 250.02 7)  Lipid Panel prior to visit, ICD-9: 8)  HbgA1C prior to visit, ICD-9: Prescriptions: PREDNISONE 10 MG TABS (PREDNISONE) 3po qd for 3days, then 2po qd for 3days, then 1po qd for 3days, then stop  #18 x 0   Entered and Authorized by:   Biagio Borg MD   Signed by:   Biagio Borg MD on 07/31/2010   Method used:   Print then Give to Patient   [id number] TRAMADOL HCL 50 MG TABS (TRAMADOL HCL) 1  by mouth q 6 hrs as needed  #60 x 0   Entered and Authorized by:   Biagio Borg MD   Signed by:   Biagio Borg MD on 07/31/2010   Method used:   Print then Give to Patient   [id number] FLEXERIL 5 MG TABS (CYCLOBENZAPRINE HCL) 1 by mouth three times a day as needed  #60 x 0   Entered and Authorized by:   Biagio Borg MD   Signed by:   Biagio Borg MD on 07/31/2010   Method used:   Print then Give to Patient   [id number] [place] 5-1.5 MG/5ML SYRP (HYDROCODONE-HOMATROPINE) 1 tsp by mouth q 6 hrs as needed  cough  #6oz x 1   Entered and Authorized by:   Biagio Borg MD   Signed by:   Biagio Borg MD on 07/31/2010   Method used:   Print then Give to Patient   [id number] AZITHROMYCIN 250 MG TABS (AZITHROMYCIN) 2po qd for 1 day, then 1po qd for 4days, then stop  #6 x 0   Entered and Authorized by:   Biagio Borg MD   Signed by:   Biagio Borg MD on 07/31/2010   Method used:   Print then Give to Patient   RxID:   [id number]    Orders Added: 1)  Est. Patient Level IV [id number]

## 2010-10-31 NOTE — Letter (Signed)
Summary: CMN for Diabetes Supplies/[facility]  CMN for Diabetes Supplies/[facility]   Imported By: Phillis Knack 01/30/2010 10:00:08  _____________________________________________________________________  External Attachment:    Type:   Image     Comment:   External Document

## 2010-10-31 NOTE — Letter (Signed)
Summary: CMN/[facility]  CMN/[facility]   Imported By: Bubba Hales 11/19/2009 11:21:10  _____________________________________________________________________  External Attachment:    Type:   Image     Comment:   External Document

## 2010-10-31 NOTE — Letter (Signed)
Summary: [name]  [name]   Imported By: Phillis Knack 02/06/2010 13:43:31  _____________________________________________________________________  External Attachment:    Type:   Image     Comment:   External Document

## 2010-10-31 NOTE — Assessment & Plan Note (Signed)
Summary: HOT, SWEATING, BP 102, COULDN'T GET BOTTOM READING/NWS   Vital Signs:  Patient profile:   60 year old male Height:      73 inches Weight:      230 pounds BMI:     30.45 O2 Sat:      91 % on Room air Temp:     97.9 degrees F oral Pulse rate:   85 / minute BP sitting:   78 / 52  (left arm) Cuff size:   large  Vitals Entered By: Shirlean Mylar Ewing CMA ([place]) (March 29, 2010 3:30 PM)  O2 Flow:  Room air CC: sweating, hot, low BP, chest pain/RE   Primary Care Provider:  Biagio Borg MD  CC:  sweating, hot, low BP, and chest pain/RE.  History of Present Illness: SBP this am was 131, so he took is BP med as has been instructed per nephrology;  here after  being seen per podiatry, but wife noticed he was sweaty, weak, blurred vision;  BP there was 102/palp;  came here instead of urgent care;  took 3 people to get him and out of the car;  now here in wheelchair in the exam room, still with weakness, blurred vision  - this has occured several times in the past and resolved with  lying down and time;  but this time also with c/o Chest pain (unusual for his recurrent low blood pressure episodes), SSCP low sternal;  not clear if sharp or dull or other, no radiation,  denies sob, but did have diaphoresis and dizziness. No palpitaiton or syncope  - but felt like he almost was ready to pass  out (also unusual for his low blood pressure episodes);  Pt denies wheezing, orthopnea, pnd, worsening LE edema.   CP lasted total 5 min, now resolved  Dialysis normally done tue-thur sat  Problems Prior to Update: 1)  Gait Disturbance  (ICD-781.2) 2)  Unspecified Hypotension  (ICD-458.9) 3)  Hyperthyroidism  (ICD-242.90) 4)  Transaminases, Serum, Elevated  (ICD-790.4) 5)  Diabetes Mellitus, Type II  (ICD-250.00) 6)  Unspecified Hypotension  (ICD-458.9) 7)  Fatigue  (ICD-780.79) 8)  Hypotension, Unspecified  (ICD-458.9) 9)  Fatigue / Malaise  (ICD-780.79) 10)  Gastroenteritis, Viral  (ICD-008.8) 11)   Preventive Health Care  (ICD-V70.0) 12)  Colonic Polyps, Hx of  (ICD-V12.72) 13)  Depression  (ICD-311) 14)  Benign Prostatic Hypertrophy  (ICD-600.00) 15)  Dementia  (ICD-294.8) 16)  Pre-operative Cardiac Exam  (ICD-V72.81) 17)  Pulmonary Nodule, Right Lower Lobe  (A999333) 18)  Diastolic Heart Failure, Chronic  (ICD-428.32) 19)  Cad, Native Vessel  (ICD-414.01) 20)  Routine General Medical Exam@health  Care Facl  (ICD-V70.0) 21)  Special Screening Malignant Neoplasm of Prostate  (ICD-V76.44) 22)  Dyspnea  (ICD-786.05) 23)  Foot Pain  (ICD-729.5) 24)  Goiter, Multinodular  (ICD-241.1) 25)  Onychomycosis, Toenails  (ICD-110.1) 26)  Dyslipidemia  (ICD-272.4) 27)  Renal Insufficiency  (ICD-588.9) 28)  Peripheral Neuropathy  (ICD-356.9) 29)  Hypertension  (ICD-401.9) 30)  Gout  (ICD-274.9) 31)  Gerd  (ICD-530.81) 32)  Congestive Heart Failure  (ICD-428.0)  Medications Prior to Update: 1)  Allopurinol 300 Mg Tabs (Allopurinol) .... 2 By Mouth Daily 2)  Adult Aspirin Low Strength 81 Mg  Tbdp (Aspirin) .... Take 1 By Mouth Qd 3)  Plavix 75 Mg  Tabs (Clopidogrel Bisulfate) .... Take 1 By Mouth Qd 4)  Isosorbide Mononitrate Cr 60 Mg Xr24h-Tab (Isosorbide Mononitrate) .... Take 1 Tablet By Mouth Once A Day 5)  Celexa 10 Mg Tabs (Citalopram Hydrobromide) .[name].. 1 Tab Once Daily 6)  Rena-Vite  Tabs (B Complex-C-Folic Acid) .... Take 1 Tablet By Mouth Once A Day 7)  Coreg 25 Mg Tabs (Carvedilol) .... Take 1 Tablet By Mouth Two Times A Day 8)  Humulin 70/30 70-30 % Susp (Insulin Isophane & Regular) .... 30 Units Qam 50 Units Qpm 9)  A1c 250.41 10)  Gabapentin 100 Mg Caps (Gabapentin) .... 2 Caps At Bedtime 11)  Epogen 3000 Unit/ml Soln (Epoetin Alfa) .... At Dialysis 12)  Oscal 500/200 D-3 500-200 Mg-Unit Tabs (Calcium-Vitamin D) .... 2 By Mouth 3 Times A Week 13)  Ranitidine Hcl 300 Mg Caps (Ranitidine Hcl) .[name].. 1 By Mouth Daily  Current Medications (verified): 1)  Allopurinol 300 Mg Tabs  (Allopurinol) .... 2 By Mouth Daily 2)  Adult Aspirin Low Strength 81 Mg  Tbdp (Aspirin) .... Take 1 By Mouth Qd 3)  Plavix 75 Mg  Tabs (Clopidogrel Bisulfate) .... Take 1 By Mouth Qd 4)  Isosorbide Mononitrate Cr 60 Mg Xr24h-Tab (Isosorbide Mononitrate) .... Take 1 Tablet By Mouth Once A Day 5)  Celexa 10 Mg Tabs (Citalopram Hydrobromide) .[name].. 1 Tab Once Daily 6)  Rena-Vite  Tabs (B Complex-C-Folic Acid) .... Take 1 Tablet By Mouth Once A Day 7)  Coreg 25 Mg Tabs (Carvedilol) .... Take 1 Tablet By Mouth Two Times A Day 8)  Humulin 70/30 70-30 % Susp (Insulin Isophane & Regular) .... 30 Units Qam 50 Units Qpm 9)  A1c 250.41 10)  Gabapentin 100 Mg Caps (Gabapentin) .... 2 Caps At Bedtime 11)  Epogen 3000 Unit/ml Soln (Epoetin Alfa) .... At Dialysis 12)  Oscal 500/200 D-3 500-200 Mg-Unit Tabs (Calcium-Vitamin D) .... 2 By Mouth 3 Times A Week 13)  Ranitidine Hcl 300 Mg Caps (Ranitidine Hcl) .[name].. 1 By Mouth Daily  Allergies (verified): 1)  ! Keflex 2)  ! * Statins  Past History:  Past Medical History: Last updated: 02/01/2010  1. Morbid obesity  2. Diabetes  3. CHF, primarily diastolic      --EF previously 37%      --last echo. 55% with mild TR      --CPX: VO2 12.0. slope of 45. Corrected to ideal body weight VO2 = 18.0      --RHC 2/11: RA 7 RV 36/10/13 PA 26/16 (22) pcwp 2 PA sats 55% and 58% Fick 5.6/2.3  4. ESRD  - dialysis dec 2009  5. OSA on CPAP  6. COPD on nightime O2  7. Myositis with a sed rate of 100       -statin stopped  8. Hyperlipidemia  9.Hypertension 10.Gout 12. Diabetes     --c/b peripheral neurpoathy 13. GERD 14. Elevated R hemidiaphragm 15. Chronic dyspnea, multifactorial  ROUTINE GENERAL MEDICAL EXAM@HEALTH  CARE FACL (ICD-V70.0) SPECIAL SCREENING MALIGNANT NEOPLASM OF PROSTATE (ICD-V76.44) FOOT PAIN (ICD-729.5) GOITER, MULTINODULAR (ICD-241.1) ONYCHOMYCOSIS, TOENAILS (ICD-110.1) Benign prostatic hypertrophy Depression GERD cervical stenosis Colonic  polyps, hx of Bipolar disease Gout Dementia Left knee djd - severe, end stage Diabetes mellitus, type II  Past Surgical History: Last updated: 10/29/2008 stent-06/11/08 neck surgery-2/09 back surgery-1998  Family History: Last updated: 10/29/2008 sister has "thyroid lumps" father-heart disease sister-heart disease  Social History: Last updated: 10/29/2008 Patient states former smoker. Quit smoking 2007.  Smoked x 25 years 1/2 ppd. Pt is married with children. Pt is disabled.  Formerly Lawyer for [employer].  Risk Factors: Smoking Status: quit (06/18/2007)  Review of Systems  The patient denies anorexia, fever, decreased hearing,  hoarseness, syncope, prolonged cough, headaches, hemoptysis, abdominal pain, melena, hematochezia, severe indigestion/heartburn, hematuria, muscle weakness, suspicious skin lesions, transient blindness, depression, unusual weight change, abnormal bleeding, enlarged lymph nodes, and angioedema.         all otherwise negative per pt -    Physical Exam  General:  alert and overweight-appearing, ill appearing, holding his head in his hands.   Head:  normocephalic and atraumatic.   Eyes:  vision grossly intact, pupils equal, and pupils round.   Ears:  R ear normal and L ear normal.   Nose:  no external deformity and no nasal discharge.   Mouth:  no gingival abnormalities and pharynx pink and moist.   Neck:  supple and no masses.   Lungs:  normal respiratory effort, R decreased breath sounds, and L decreased breath sounds.   Heart:  normal rate and regular rhythm.   Abdomen:  soft, non-tender, and normal bowel sounds.   Msk:  no joint tenderness and no joint swelling.   Extremities:  trace edema bilat Neurologic:  alert & oriented X3. , unable to stand due to weakness   Impression & Recommendations:  Problem # 1:  UNSPECIFIED HYPOTENSION (ICD-458.9) has known labile BP and knows to not take his BP meds for SBP < 130, but today's  episode more severe, cant walk or stand, and assoc with chest pain.  will need admit for further eval and tx;  r/o MI  Problem # 2:  CHEST PAIN (ICD-786.50)  as above; ecg reviewed - no acute  Orders: EKG w/ Interpretation (93000)  Problem # 3:  RENAL INSUFFICIENCY (ICD-588.9) will likely need renal consult for HD - due for HD june 20 next  Problem # 4:  Snyder (ICD-428.0)  His updated medication list for this problem includes:    Adult Aspirin Low Strength 81 Mg Tbdp (Aspirin) .[name]... Take 1 by mouth qd    Plavix 75 Mg Tabs (Clopidogrel bisulfate) .[name]... Take 1 by mouth qd    Coreg 25 Mg Tabs (Carvedilol) .[name]... Take 1 tablet by mouth two times a day o/w stable   Complete Medication List: 1)  Allopurinol 300 Mg Tabs (Allopurinol) .... 2 by mouth daily 2)  Adult Aspirin Low Strength 81 Mg Tbdp (Aspirin) .... Take 1 by mouth qd 3)  Plavix 75 Mg Tabs (Clopidogrel bisulfate) .... Take 1 by mouth qd 4)  Isosorbide Mononitrate Cr 60 Mg Xr24h-tab (Isosorbide mononitrate) .... Take 1 tablet by mouth once a day 5)  Celexa 10 Mg Tabs (Citalopram hydrobromide) .[name].. 1 tab once daily 6)  Rena-vite Tabs (B complex-c-folic acid) .... Take 1 tablet by mouth once a day 7)  Coreg 25 Mg Tabs (Carvedilol) .... Take 1 tablet by mouth two times a day 8)  Humulin 70/30 70-30 % Susp (Insulin isophane & regular) .... 30 units qam 50 units qpm 9)  A1c 250.41  10)  Gabapentin 100 Mg Caps (Gabapentin) .... 2 caps at bedtime 11)  Epogen 3000 Unit/ml Soln (Epoetin alfa) .... At dialysis 12)  Oscal 500/200 D-3 500-200 Mg-unit Tabs (Calcium-vitamin d) .... 2 by mouth 3 times a week 13)  Ranitidine Hcl 300 Mg Caps (Ranitidine hcl) .[name].. 1 by mouth daily  Patient Instructions: 1)  we will call ambulance for transport to [facility] hospital ER 2)  you will need to be admitted for heart evaluation

## 2010-10-31 NOTE — Miscellaneous (Signed)
Summary: Certification & Plan of Care / [place] of Care / [place] By: Rise Patience 02/07/2010 13:52:29  _____________________________________________________________________  External Attachment:    Type:   Image     Comment:   External Document

## 2010-10-31 NOTE — Miscellaneous (Signed)
Summary: Orders Update  Clinical Lists Changes  Problems: Added new problem of HYPERTHYROIDISM (ICD-242.90) Orders: Added new Referral order of Endocrinology Referral (Endocrine) - Signed

## 2010-10-31 NOTE — Letter (Signed)
Summary: Cardiac Catheterization Instructions- [place] [place]  [id number] [place]   [place], [place] [address]   Phone: [phone]  Fax: [phone]     11/04/2009 MRN: [id number]  [name] [address] [place],   [address]  Dear Mr. Fryman,   You are scheduled for a Cardiac Catheterization on Friday 11/11/09 with Dr. [name]  Please arrive to the 1st floor of the Heart and Vascular Center at [facility] at 11:30 am / pm on the day of your procedure. Please do not arrive before 6:30 a.m. Call the Heart and Vascular Center at [phone] if you are unable to make your appointmnet. The Code to get into the parking garage under the building is 0900. Take the elevators to the 1st floor. You must have someone to drive you home. Someone must be with you for the first 24 hours after you arrive home. Please wear clothes that are easy to get on and off and wear slip-on shoes. Do not eat or drink after midnight except water with your medications that morning. Bring all your medications and current insurance cards with you.  _X__ DO NOT take these medications before your procedure:   Hold AM Insulin  ___ Make sure you take your aspirin.  ___ You may take ALL of your medications with water that morning. ________________________________________________________________________________________________________________________________  ___ DO NOT take ANY medications before your procedure.  ___ Pre-med instructions:  ________________________________________________________________________________________________________________________________  The usual length of stay after your procedure is 2 to 3 hours. This can vary.  If you have any questions, please call the office at the number listed above.   Kevan Rosebush, RN

## 2010-10-31 NOTE — Miscellaneous (Signed)
Summary: Pt Eval / [place] / [facility]   Imported By: Rise Patience 03/30/2010 14:18:33  _____________________________________________________________________  External Attachment:    Type:   Image     Comment:   External Document

## 2010-10-31 NOTE — Miscellaneous (Signed)
Summary: Renewal for PT/[place]  Renewal for PT/   Imported By: Phillis Knack 05/12/2010 13:26:33  _____________________________________________________________________  External Attachment:    Type:   Image     Comment:   External Document

## 2010-10-31 NOTE — Miscellaneous (Signed)
Summary: Orders Update  Clinical Lists Changes  Orders: Added new Test order of Carotid Duplex (Carotid Duplex) - Signed 

## 2010-10-31 NOTE — Assessment & Plan Note (Signed)
Summary: 4-5 month rov/sl   Referring Provider:  Dr Wallene Huh Primary Provider:  Loanne Drilling  CC:  SOB.  History of Present Illness: [name] is 60 year old male with multiple medical problems including morbid obesity, diabetes, myositis with a sed rate > 100, COPD on nighttime oxygen, sleep apnea on CPAP, end-stage renal disease, coronary artery disease, status post Taxus drug-eluting stent to the right coronary in September 2008, and a previous history of congestive heart failure secondary to ischemic cardiomyopathy with an EF of 37%.    Most recent echocardiogram September 2010  however, showed an EF of 55% with mild diastolic dysfunction. Myoview had EF of 43% with inferior scar. no ischemia.  Feels ok. Breathing ok as long as he doesn't push it too much. Feels his stamina is poor. Gets fatigued when walking down driveway to mailbox. No CP. Very tired during dialysis and the whole next day. BP has been low during HD. Edema well controlled with HD. No orthopnea or PND. Uses CPAP.  He thinks they are letting his dry weight drift up a little.    Current Medications (verified): 1)  Zantac 300 Mg  Tabs (Ranitidine Hcl) .... Take 1 By Mouth Qd 2)  Allopurinol 100 Mg  Tabs (Allopurinol) .... Take 2 Tabs By Mouth Daily 3)  Adult Aspirin Low Strength 81 Mg  Tbdp (Aspirin) .... Take 1 By Mouth Qd 4)  Plavix 75 Mg  Tabs (Clopidogrel Bisulfate) .... Take 1 By Mouth Qd 5)  Isosorbide Mononitrate Cr 60 Mg Xr24h-Tab (Isosorbide Mononitrate) .... Take 1 Tablet By Mouth Once A Day 6)  Celexa 10 Mg Tabs (Citalopram Hydrobromide) .... As Needed 7)  Rena-Vite  Tabs (B Complex-C-Folic Acid) .... Take 1 Tablet By Mouth Once A Day 8)  Coreg 25 Mg Tabs (Carvedilol) .... Take 1 Tablet By Mouth Two Times A Day 9)  Hydrocodone-Acetaminophen 5-500 Mg Tabs (Hydrocodone-Acetaminophen) .[name].. 1 Once Daily As Needed Pain 10)  Amlodipine Besylate 10 Mg Tabs (Amlodipine Besylate) .... Daily 11)  Humulin 70/30 70-30 %  Susp (Insulin Isophane & Regular) .... 40 Units Qam 15 Units Qpm 12)  A1c 250.41 .... B-12 331.0 Rpr 331.0 13)  Gabapentin 100 Mg Caps (Gabapentin) .... 2 Caps At Bedtime  Allergies (verified): 1)  ! Keflex 2)  ! * Statins  Past History:  Past Medical History: Reviewed history from 10/14/2009 and no changes required.  1. Morbid obesity  2. Diabetes  3. CHF, primarily diastolic      --EF previously 37%      --last echo. 55% with mild TR      --CPX: VO2 12.0. slope of 45. Corrected to ideal body weight VO2 = 18.0  4. ESRD  - dialysis dec 2009  5. OSA on CPAP  6. COPD on nightime O2  7. Myositis with a sed rate of 100       -statin stopped  8. Hyperlipidemia  9.Hypertension 10.Gout 12. Diabetes     --c/b peripheral neurpoathy 13. GERD 14. Elevated R hemidiaphragm 15. Chronic dyspnea, multifactorial  ROUTINE GENERAL MEDICAL EXAM@HEALTH  CARE FACL (ICD-V70.0) SPECIAL SCREENING MALIGNANT NEOPLASM OF PROSTATE (ICD-V76.44) FOOT PAIN (ICD-729.5) GOITER, MULTINODULAR (ICD-241.1) ONYCHOMYCOSIS, TOENAILS (ICD-110.1) Benign prostatic hypertrophy Depression GERD cervical stenosis Colonic polyps, hx of Bipolar disease Gout Dementia Left knee djd - severe, end stage  Review of Systems       As per HPI and past medical history; otherwise all systems negative.   Vital Signs:  Patient profile:   60 year old male Height:  73 inches Weight:      261 pounds BMI:     34.56 Pulse rate:   71 / minute BP sitting:   146 / 78  (left arm) Cuff size:   regular  Vitals Entered By: Mignon Pine, RMA (November 04, 2009 10:19 AM)  Physical Exam  General:  Gen: fatigued appearing. no resp difficulty sats with exertion 88-90% HEENT: normal Neck: supple. no JVD. Carotids 2+ bilat; no bruits.  Cor: PMI laterally displaced. Regular rate & rhythm. +s4 No rubs, gallops, 2/6 SEM LSB Lungs: clear Abdomen: obeses soft, nontender, nondistended. Good bowel sounds. Extremities: no  cyanosis, clubbing, rash, edema Neuro: alert & orientedx3, cranial nerves grossly intact. moves all 4 extremities w/o difficulty. affect pleasant    Impression & Recommendations:  Problem # 1:  DIASTOLIC HEART FAILURE, CHRONIC (ICD-428.32) Continues with NYHA Class III symptoms which is reflected by his CPX. Unfotunately given the fact that he is on dialysis we hdon't have many options here to adjust his care as fluid status dictated by HD and have to be careful with meds due to low BP with HD. We did discuss the poosibility of doing a right heart cath to assess for low output and they would like to proceed with this.   Orders: TLB-BMP (Basic Metabolic Panel-BMET) (99991111) TLB-CBC Platelet - w/Differential (85025-CBCD) TLB-PT (Protime) (85610-PTP) Cardiac Catheterization (Cardiac Cath)  Problem # 2:  CAD, NATIVE VESSEL (ICD-414.01) Stable. No evidence of ischemia. Continue current regimen.  Other Orders: EKG w/ Interpretation (93000)  Patient Instructions: 1)  Labs today 2)  Follow up in 6 months

## 2010-10-31 NOTE — Assessment & Plan Note (Signed)
Summary: 4 month rov/sl   Visit Type:  Follow-up Referring Provider:  Dr Wallene Huh Primary Provider:  Biagio Borg MD  CC:  low bp.  History of Present Illness: [name] is 60 year old male with multiple medical problems including morbid obesity, diabetes, myositis with a sed rate > 100, COPD on nighttime oxygen, sleep apnea on CPAP, end-stage renal disease, coronary artery disease, status post Taxus drug-eluting stent to the right coronary in September 2008, and a previous history of congestive heart failure secondary to ischemic cardiomyopathy with an EF of 37%.    Most recent echocardiogram September 2010  however, showed an EF of 55% with mild diastolic dysfunction. Myoview had EF of 43% with inferior scar. no ischemia. Had [place] in 2/11 which showed low pressures (PCWP = 2) and normal cardiac output.  rthis year admitted with ascending cholangitis and klebsiella bacteremia. Underwent several ERCP attempts with eventual stent placement. Then underwent cholecystectomy.  Here for routine f/u. Had chest CT for lung nodule f/u which was stable.   Continues to feel fatigued. Breathing better. No edema. Having probllems with frequent low BP an has had several ER visits becasue of it. Gets dizzy and weak but no syncope. No CP. Coreg recently cut in half. In HD occasionally have to give fluid back. No fevers/chills. Can happen at ay time. Not positional.  Current Medications (verified): 1)  Carvedilol 12.5 Mg Tabs (Carvedilol) .[name].. 1po Two Times A Day 2)  Adult Aspirin Low Strength 81 Mg  Tbdp (Aspirin) .... Take 1 By Mouth Qd 3)  Plavix 75 Mg  Tabs (Clopidogrel Bisulfate) .... Take 1 By Mouth Qd 4)  Isosorbide Mononitrate Cr 60 Mg Xr24h-Tab (Isosorbide Mononitrate) .... Take 1 Tablet By Mouth Once A Day 5)  Rena-Vite  Tabs (B Complex-C-Folic Acid) .... Take 1 Tablet By Mouth Once A Day 6)  Humulin 70/30 70-30 % Susp (Insulin Isophane & Regular) .... 30 Units Qam 5 Units Qpm 7)  A1c  250.41 8)  Gabapentin 300 Mg Caps (Gabapentin) .... Take One At Bedtine 9)  Epogen 3000 Unit/ml Soln (Epoetin Alfa) .... At Dialysis 10)  Oscal 500/200 D-3 500-200 Mg-Unit Tabs (Calcium-Vitamin D) .... 2 By Mouth 3 Times A Week 11)  Ranitidine Hcl 300 Mg Caps (Ranitidine Hcl) .[name].. 1 By Mouth Daily 12)  Sensipar 90 Mg Tabs (Cinacalcet Hcl) .[name].. 1 By Mouth Once Daily 13)  Allopurinol 100 Mg Tabs (Allopurinol) .... Take One Daily 14)  Fish Oil 1000 Mg Caps (Omega-3 Fatty Acids) .... Take Three Daily  Allergies: 1)  ! Keflex 2)  ! * Statins  Past History:  Past Medical History: Last updated: 02/01/2010  1. Morbid obesity  2. Diabetes  3. CHF, primarily diastolic      --EF previously 37%      --last echo. 55% with mild TR      --CPX: VO2 12.0. slope of 45. Corrected to ideal body weight VO2 = 18.0      --RHC 2/11: RA 7 RV 36/10/13 PA 26/16 (22) pcwp 2 PA sats 55% and 58% Fick 5.6/2.3  4. ESRD  - dialysis dec 2009  5. OSA on CPAP  6. COPD on nightime O2  7. Myositis with a sed rate of 100       -statin stopped  8. Hyperlipidemia  9.Hypertension 10.Gout 12. Diabetes     --c/b peripheral neurpoathy 13. GERD 14. Elevated R hemidiaphragm 15. Chronic dyspnea, multifactorial  ROUTINE GENERAL MEDICAL EXAM@HEALTH  CARE FACL (ICD-V70.0) SPECIAL SCREENING MALIGNANT NEOPLASM  OF PROSTATE (ICD-V76.44) FOOT PAIN (ICD-729.5) GOITER, MULTINODULAR (ICD-241.1) ONYCHOMYCOSIS, TOENAILS (ICD-110.1) Benign prostatic hypertrophy Depression GERD cervical stenosis Colonic polyps, hx of Bipolar disease Gout Dementia Left knee djd - severe, end stage Diabetes mellitus, type II  Vital Signs:  Patient profile:   60 year old male Height:      73 inches Weight:      239 pounds Pulse rate:   88 / minute Pulse rhythm:   regular BP sitting:   185 / 87  Vitals Entered By: Talbert Nan, CMA (June 07, 2010 10:38 AM)  Serial Vital Signs/Assessments:  Comments: 12:02 PM 164/74  85  lying   on calf 168/85  85 sitting  on calf no symptoms at all. By: Talbert Nan, CMA    Physical Exam  General:  no acute distress. no resp difficulty HEENT: normal Neck: supple. no JVD. Carotids 2+ bilat; no bruits.  Cor: PMI nondisplaced. Regular rate & rhythm. No rubs, +s4, murmur. + hickman Abdomen: obese. soft, nontender, nondistended. umbilical wound healing well. good bowel sounds. Extremities: no cyanosis, clubbing, rash, edema Neuro: alert & orientedx3, cranial nerves grossly intact. moves all 4 extremities w/o difficulty. affect pleasant    Impression & Recommendations:  Problem # 1:  HYPOTENSION, UNSPECIFIED (ICD-458.9) Will check orthostatics. D/c Imdur. If hypotension continues would liberalize salt intake and cut carvedilol as needed. Can also add midodrine if orthostatic.  Addendum: patient not orthostatic.  Problem # 2:  DIASTOLIC HEART FAILURE, CHRONIC (ICD-428.32) Stable. Dyspnea much improved.Volume management per HD.   Other Orders: EKG w/ Interpretation (93000)  Patient Instructions: 1)  Stop Imdur 2)  Your physician wants you to follow-up in:  9 months.  You will receive a reminder letter in the mail two months in advance. If you don't receive a letter, please call our office to schedule the follow-up appointment.

## 2010-10-31 NOTE — Letter (Signed)
Summary: [facility]  [facility]   Imported By: Phillis Knack 03/02/2010 10:35:37  _____________________________________________________________________  External Attachment:    Type:   Image     Comment:   External Document

## 2010-10-31 NOTE — Letter (Signed)
Summary: [facility]   Imported By: Bubba Hales 11/19/2009 11:22:26  _____________________________________________________________________  External Attachment:    Type:   Image     Comment:   External Document

## 2010-10-31 NOTE — Cardiovascular Report (Signed)
Summary: Pre Cath Orders  Pre Cath Orders   Imported By: Sallee Provencal 11/09/2009 12:26:48  _____________________________________________________________________  External Attachment:    Type:   Image     Comment:   External Document

## 2010-10-31 NOTE — Progress Notes (Signed)
----   Converted from flag ---- ---- 02/02/2010 3:32 PM, Lucienne Capers wrote: Gave pt appt:  02/06/10 @ 1p w/Dr SAE---phone  ---- 02/02/2010 12:59 PM, Ophelia Charter wrote: Please schedule with Dr Loanne Drilling.  Thanks   ---- 02/02/2010 12:56 PM, Biagio Borg MD wrote: The following orders have been entered for this patient and placed on Admin Hold:  Type:     Referral       Code:   Endocrine Description:   Endocrinology Referral Order Date:   02/02/2010   Authorized By:   Biagio Borg MD Order #:   [id number] Clinical Notes:   dr Loanne Drilling ------------------------------

## 2010-10-31 NOTE — Assessment & Plan Note (Signed)
Summary: NEW/ SEES DR Loanne Drilling / INSURANCE WON'T HIM SEE SAE FOR PCP & ...   Vital Signs:  Patient profile:   60 year old male Height:      73 inches Weight:      269 pounds BMI:     35.62 O2 Sat:      95 % on Room air Temp:     97.5 degrees F oral Pulse rate:   76 / minute BP sitting:   136 / 66  (left arm) Cuff size:   large  Vitals Entered ByShirlean Mylar Ewing (October 14, 2009 3:12 PM)  O2 Flow:  Room air  Preventive Care Screening  Colonoscopy:    Date:  10/01/2008    Next Due:  10/2011    Results:  abnormal   Last Pneumovax:    Date:  08/31/2008    Results:  given   CC: New pt. get established/RE   Primary Care Provider:  Loanne Drilling  CC:  New pt. get established/RE.  History of Present Illness: overall doing ok; sees dr Loanne Drilling for DM, adn Dr Jeffie Pollock for cards; here to establish as new pt for PCP;  Pt denies CP, sob, doe, wheezing, orthopnea, pnd, worsening LE edema, palps, dizziness or syncope  Pt denies new neuro symptoms such as headache, facial or extremity weakness .  Pt denies polydipsia, polyuria, or low sugar symptoms such as shakiness improved with eating.  Overall good compliance with meds, trying to follow low chol, DM diet, wt stable, little excercise however  Here after wife ill (now beter) and now he iwth similar symptoms of abd bloating, gas, growling, crampy pains and watery loose stools,  -  all now improved but present for 2 wks.  Also dialysis pt (sees DR Mercy Moore)- gets more abd symptoms when first stands up.   no fever, ST, cough.   Cant remember his last PSA excpet thinks it was recent.   Problems Prior to Update: 1)  Gastroenteritis, Viral  (ICD-008.8) 2)  Preventive Health Care  (ICD-V70.0) 3)  Colonic Polyps, Hx of  (ICD-V12.72) 4)  Depression  (ICD-311) 5)  Benign Prostatic Hypertrophy  (ICD-600.00) 6)  Dementia  (ICD-294.8) 7)  Pre-operative Cardiac Exam  (ICD-V72.81) 8)  Pulmonary Nodule, Right Lower Lobe  (A999333) 9)  Diastolic Heart  Failure, Chronic  (ICD-428.32) 10)  Cad, Native Vessel  (ICD-414.01) 11)  Routine General Medical Exam@health  Care Facl  (ICD-V70.0) 12)  Special Screening Malignant Neoplasm of Prostate  (ICD-V76.44) 13)  Dyspnea  (ICD-786.05) 14)  Foot Pain  (ICD-729.5) 15)  Goiter, Multinodular  (ICD-241.1) 16)  Onychomycosis, Toenails  (ICD-110.1) 17)  Dyslipidemia  (ICD-272.4) 18)  Renal Insufficiency  (ICD-588.9) 19)  Peripheral Neuropathy  (ICD-356.9) 20)  Hypertension  (ICD-401.9) 21)  Gout  (ICD-274.9) 22)  Gerd  (ICD-530.81) 23)  Diabetes Mellitus, Type I  (ICD-250.01) 24)  Congestive Heart Failure  (ICD-428.0)  Medications Prior to Update: 1)  Zantac 300 Mg  Tabs (Ranitidine Hcl) .... Take 1 By Mouth Qd 2)  Allopurinol 100 Mg  Tabs (Allopurinol) .... Take 2 Tabs By Mouth Daily 3)  Adult Aspirin Low Strength 81 Mg  Tbdp (Aspirin) .... Take 1 By Mouth Qd 4)  Plavix 75 Mg  Tabs (Clopidogrel Bisulfate) .... Take 1 By Mouth Qd 5)  Novolin 70/30 70-30 %  Susp (Insulin Isophane & Regular) .... 35 Units Qam   15 Qpm 6)  Isosorbide Mononitrate Cr 60 Mg Xr24h-Tab (Isosorbide Mononitrate) .... Take 1 Tablet By Mouth Once A  Day 7)  Celexa 10 Mg Tabs (Citalopram Hydrobromide) .... As Needed 8)  Rena-Vite  Tabs (B Complex-C-Folic Acid) .... Take 1 Tablet By Mouth Once A Day 9)  Coreg 25 Mg Tabs (Carvedilol) .... Take 1 Tablet By Mouth Two Times A Day 10)  Terazosin Hcl 10 Mg Caps (Terazosin Hcl) .[name].. 1 Tab By Mouth Once Daily 11)  Hydrocodone-Acetaminophen 5-500 Mg Tabs (Hydrocodone-Acetaminophen) .[name].. 1 Once Daily As Needed Pain 12)  Amlodipine Besylate 10 Mg Tabs (Amlodipine Besylate) .... Daily 13)  Humulin 70/30 70-30 % Susp (Insulin Isophane & Regular) .... 40 Units Qam 15 Units Qpm 14)  A1c 250.41 .... B-12 331.0 Rpr 331.0  Current Medications (verified): 1)  Zantac 300 Mg  Tabs (Ranitidine Hcl) .... Take 1 By Mouth Qd 2)  Allopurinol 100 Mg  Tabs (Allopurinol) .... Take 2 Tabs By Mouth  Daily 3)  Adult Aspirin Low Strength 81 Mg  Tbdp (Aspirin) .... Take 1 By Mouth Qd 4)  Plavix 75 Mg  Tabs (Clopidogrel Bisulfate) .... Take 1 By Mouth Qd 5)  Novolin 70/30 70-30 %  Susp (Insulin Isophane & Regular) .... 35 Units Qam   15 Qpm 6)  Isosorbide Mononitrate Cr 60 Mg Xr24h-Tab (Isosorbide Mononitrate) .... Take 1 Tablet By Mouth Once A Day 7)  Celexa 10 Mg Tabs (Citalopram Hydrobromide) .... As Needed 8)  Rena-Vite  Tabs (B Complex-C-Folic Acid) .... Take 1 Tablet By Mouth Once A Day 9)  Coreg 25 Mg Tabs (Carvedilol) .... Take 1 Tablet By Mouth Two Times A Day 10)  Terazosin Hcl 10 Mg Caps (Terazosin Hcl) .[name].. 1 Tab By Mouth Once Daily 11)  Hydrocodone-Acetaminophen 5-500 Mg Tabs (Hydrocodone-Acetaminophen) .[name].. 1 Once Daily As Needed Pain 12)  Amlodipine Besylate 10 Mg Tabs (Amlodipine Besylate) .... Daily 13)  Humulin 70/30 70-30 % Susp (Insulin Isophane & Regular) .... 40 Units Qam 15 Units Qpm 14)  A1c 250.41 .... B-12 331.0 Rpr 331.0  Allergies (verified): 1)  ! Keflex 2)  ! * Statins  Past History:  Family History: Last updated: 10/29/2008 sister has "thyroid lumps" father-heart disease sister-heart disease  Social History: Last updated: 10/29/2008 Patient states former smoker. Quit smoking 2007.  Smoked x 25 years 1/2 ppd. Pt is married with children. Pt is disabled.  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 Morbid obesity  2. Diabetes  3. CHF, primarily diastolic      --EF previously 37%      --last echo. 55% with mild TR      --CPX: VO2 12.0. slope of 45. Corrected to ideal body weight VO2 = 18.0  4. ESRD  - dialysis dec 2009  5. OSA on CPAP  6. COPD on nightime O2  7. Myositis with a sed rate of 100       -statin stopped  8. Hyperlipidemia  9.Hypertension 10.Gout 12. Diabetes     --c/b peripheral neurpoathy 13. GERD 14. Elevated R hemidiaphragm 15. Chronic dyspnea,  multifactorial  ROUTINE GENERAL MEDICAL EXAM@HEALTH  CARE FACL (ICD-V70.0) SPECIAL SCREENING MALIGNANT NEOPLASM OF PROSTATE (ICD-V76.44) FOOT PAIN (ICD-729.5) GOITER, MULTINODULAR (ICD-241.1) ONYCHOMYCOSIS, TOENAILS (ICD-110.1) Benign prostatic hypertrophy Depression GERD cervical stenosis Colonic polyps, hx of Bipolar disease Gout Dementia Left knee djd - severe, end stage  Past Surgical History: Reviewed history from 10/29/2008 and no changes required. stent-06/11/08 neck surgery-2/09 back surgery-1998  Review of Systems  The patient denies anorexia, fever, vision loss, decreased hearing, hoarseness, chest pain, syncope,  dyspnea on exertion, peripheral edema, prolonged cough, headaches, hemoptysis, abdominal pain, melena, hematochezia, severe indigestion/heartburn, hematuria, incontinence, suspicious skin lesions, transient blindness, difficulty walking, depression, unusual weight change, abnormal bleeding, and enlarged lymph nodes.         all otherwise negative per pt -  Physical Exam  General:  alert and overweight-appearing.   Head:  normocephalic and atraumatic.   Eyes:  vision grossly intact, pupils equal, and pupils round.   Ears:  R ear normal and L ear normal.   Nose:  no external deformity and no nasal discharge.   Mouth:  no gingival abnormalities and pharynx pink and moist.   Neck:  supple and no masses.   Lungs:  normal respiratory effort, R decreased breath sounds, and L decreased breath sounds.   Heart:  normal rate and regular rhythm.   Abdomen:  soft, non-tender, and normal bowel sounds.   Msk:  no joint tenderness and no joint swelling.   Extremities:  no edema, no erythema  Neurologic:  cranial nerves II-XII intact and strength normal in all extremities.   Skin:  color normal and no rashes.     Impression & Recommendations:  Problem # 1:  Preventive Health Care (ICD-V70.0) Overall doing well, updated the age appropriate counseling and education;   routine health screening/prevention reviewed and done as appropriate unless declined, immunizations up to date or declined, diet counseling done if overweight, urged to quit smoking if smokes , most recent labs reviewed and current ordered if appropriate, ecg reviewed or declined ; declines PSA today  Problem # 2:  GASTROENTERITIS, VIRAL (ICD-008.8) resolving; ok to follow  Complete Medication List: 1)  Zantac 300 Mg Tabs (Ranitidine hcl) .... Take 1 by mouth qd 2)  Allopurinol 100 Mg Tabs (Allopurinol) .... Take 2 tabs by mouth daily 3)  Adult Aspirin Low Strength 81 Mg Tbdp (Aspirin) .... Take 1 by mouth qd 4)  Plavix 75 Mg Tabs (Clopidogrel bisulfate) .... Take 1 by mouth qd 5)  Novolin 70/30 70-30 % Susp (Insulin isophane & regular) .... 35 units qam   15 qpm 6)  Isosorbide Mononitrate Cr 60 Mg Xr24h-tab (Isosorbide mononitrate) .... Take 1 tablet by mouth once a day 7)  Celexa 10 Mg Tabs (Citalopram hydrobromide) .... As needed 8)  Rena-vite Tabs (B complex-c-folic acid) .... Take 1 tablet by mouth once a day 9)  Coreg 25 Mg Tabs (Carvedilol) .... Take 1 tablet by mouth two times a day 10)  Terazosin Hcl 10 Mg Caps (Terazosin hcl) .[name].. 1 tab by mouth once daily 11)  Hydrocodone-acetaminophen 5-500 Mg Tabs (Hydrocodone-acetaminophen) .[name].. 1 once daily as needed pain 12)  Amlodipine Besylate 10 Mg Tabs (Amlodipine besylate) .... Daily 13)  Humulin 70/30 70-30 % Susp (Insulin isophane & regular) .... 40 units qam 15 units qpm 14)  A1c 250.41  .... B-12 331.0 rpr 331.0  Patient Instructions: 1)  please continue to see your specialists as you normally do 2)  Continue all previous medications as before this visit  3)  you can also take immidium as needed for the loose stools 4)  Please schedule a follow-up appointment in 1 year or sooner if needed

## 2010-10-31 NOTE — Assessment & Plan Note (Signed)
Summary: 3 MTH FU  STC   Vital Signs:  Patient profile:   60 year old male Height:      73 inches (185.42 cm) Weight:      253.38 pounds (115.17 kg) O2 Sat:      93 % on Room air Temp:     96.7 degrees F (35.94 degrees C) oral Pulse rate:   72 / minute BP sitting:   104 / 64  (left arm) Cuff size:   large  Vitals Entered By: Gardenia Phlegm RMA (December 02, 2009 2:24 PM)  O2 Flow:  Room air CC: 3 month follow up/ CF Is Patient Diabetic? Yes   Referring Provider:  Dr Wallene Huh Primary Provider:  Loanne Drilling  CC:  3 month follow up/ CF.  History of Present Illness: he brings a record of his cbg's which i have reviewed today.  it varies from 100-200, with no trend throughout the day.    Current Medications (verified): 1)  Zantac 300 Mg  Tabs (Ranitidine Hcl) .... Take 1 By Mouth Qd 2)  Allopurinol 100 Mg  Tabs (Allopurinol) .... Take 2 Tabs By Mouth Daily 3)  Adult Aspirin Low Strength 81 Mg  Tbdp (Aspirin) .... Take 1 By Mouth Qd 4)  Plavix 75 Mg  Tabs (Clopidogrel Bisulfate) .... Take 1 By Mouth Qd 5)  Isosorbide Mononitrate Cr 60 Mg Xr24h-Tab (Isosorbide Mononitrate) .... Take 1 Tablet By Mouth Once A Day 6)  Celexa 10 Mg Tabs (Citalopram Hydrobromide) .[name].. 1 Tab Once Daily 7)  Rena-Vite  Tabs (B Complex-C-Folic Acid) .... Take 1 Tablet By Mouth Once A Day 8)  Coreg 25 Mg Tabs (Carvedilol) .... Take 1 Tablet By Mouth Two Times A Day 9)  Amlodipine Besylate 10 Mg Tabs (Amlodipine Besylate) .... Daily 10)  Humulin 70/30 70-30 % Susp (Insulin Isophane & Regular) .... 40 Units Qam 15 Units Qpm 11)  A1c 250.41 .... B-12 331.0 Rpr 331.0 12)  Gabapentin 100 Mg Caps (Gabapentin) .... 2 Caps At Bedtime  Allergies (verified): 1)  ! Keflex 2)  ! * Statins  Past History:  Past Medical History: Last updated: 10/14/2009  1. Morbid obesity  2. Diabetes  3. CHF, primarily diastolic      --EF previously 37%      --last echo. 55% with mild TR      --CPX: VO2 12.0. slope of 45.  Corrected to ideal body weight VO2 = 18.0  4. ESRD  - dialysis dec 2009  5. OSA on CPAP  6. COPD on nightime O2  7. Myositis with a sed rate of 100       -statin stopped  8. Hyperlipidemia  9.Hypertension 10.Gout 12. Diabetes     --c/b peripheral neurpoathy 13. GERD 14. Elevated R hemidiaphragm 15. Chronic dyspnea, multifactorial  ROUTINE GENERAL MEDICAL EXAM@HEALTH  CARE FACL (ICD-V70.0) SPECIAL SCREENING MALIGNANT NEOPLASM OF PROSTATE (ICD-V76.44) FOOT PAIN (ICD-729.5) GOITER, MULTINODULAR (ICD-241.1) ONYCHOMYCOSIS, TOENAILS (ICD-110.1) Benign prostatic hypertrophy Depression GERD cervical stenosis Colonic polyps, hx of Bipolar disease Gout Dementia Left knee djd - severe, end stage  Review of Systems  The patient denies hypoglycemia.    Physical Exam  General:  obese.  no distress  Neck:  Supple without thyroid enlargement or tenderness.  Additional Exam:  a1c was 6.7, 10/28/09, at dialysis   Impression & Recommendations:  Problem # 1:  DIABETES MELLITUS, TYPE I (ICD-250.01) overcontrolled  Medications Added to Medication List This Visit: 1)  Humulin 70/30 70-30 % Susp (Insulin isophane &  regular) .... 40 units qam 10 units qpm 2)  A1c 250.41   Other Orders: Est. Patient Level III [id number]  Patient Instructions: 1)  reduce novolin 70/30, to 35 units am and 10 units pm. 2)  Please schedule a follow-up appointment in 3 months. 3)  please do another a1c at dialysis, approx late april.  here is another prescription to breing there. Prescriptions: A1C 250.41   #0 x 0   Entered and Authorized by:   Donavan Foil MD   Signed by:   Donavan Foil MD on 12/02/2009   Method used:   Print then Mail to Patient   RxID:   [id number] A1C 250.41 b-12 331.0 rpr 331.0  #0 x 0   Entered and Authorized by:   Donavan Foil MD   Signed by:   Donavan Foil MD on 12/02/2009   Method used:   Print then Mail to Patient   RxID:   [id number]

## 2010-11-02 IMAGING — RF DG ERCP WO/W SPHINCTEROTOMY
1 series · 4 of 4 positions shown · non-contrast
Comparison: 01/06/2010 and earlier.

Fluoroscopy time of 6.1 minutes was utilized.

CLINICAL DATA: 58-year-old male undergoing ERCP.

ERCP

[Series 1: run · 4 of 4 slices shown]
[im 1/4]
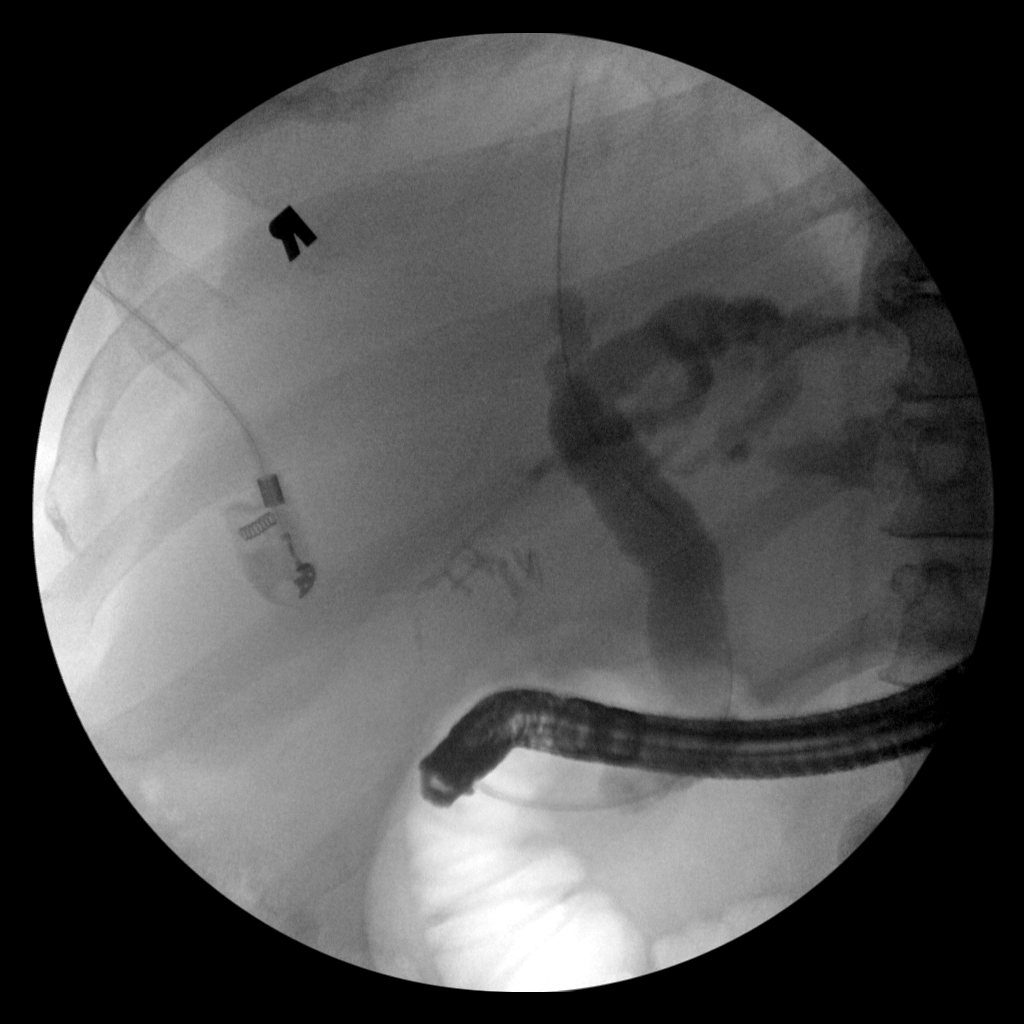
[im 2/4]
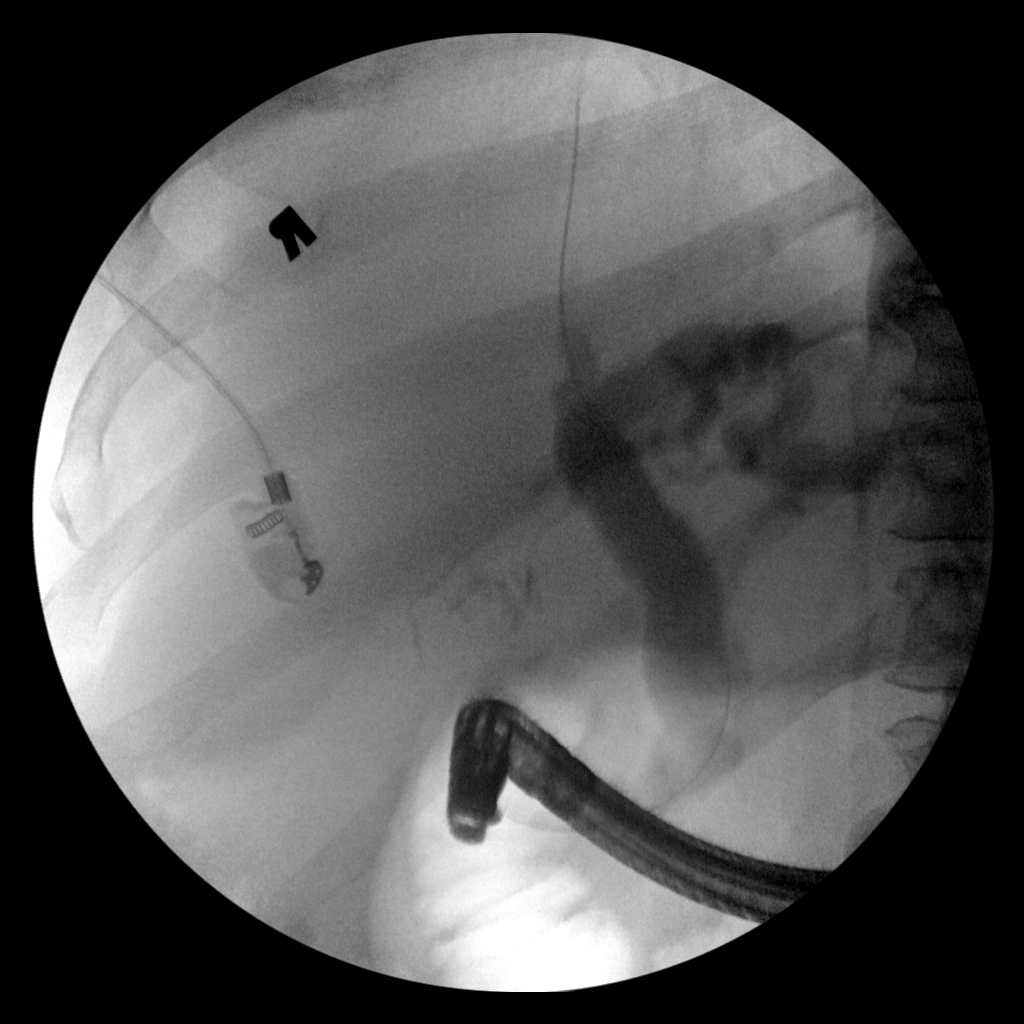
[im 3/4]
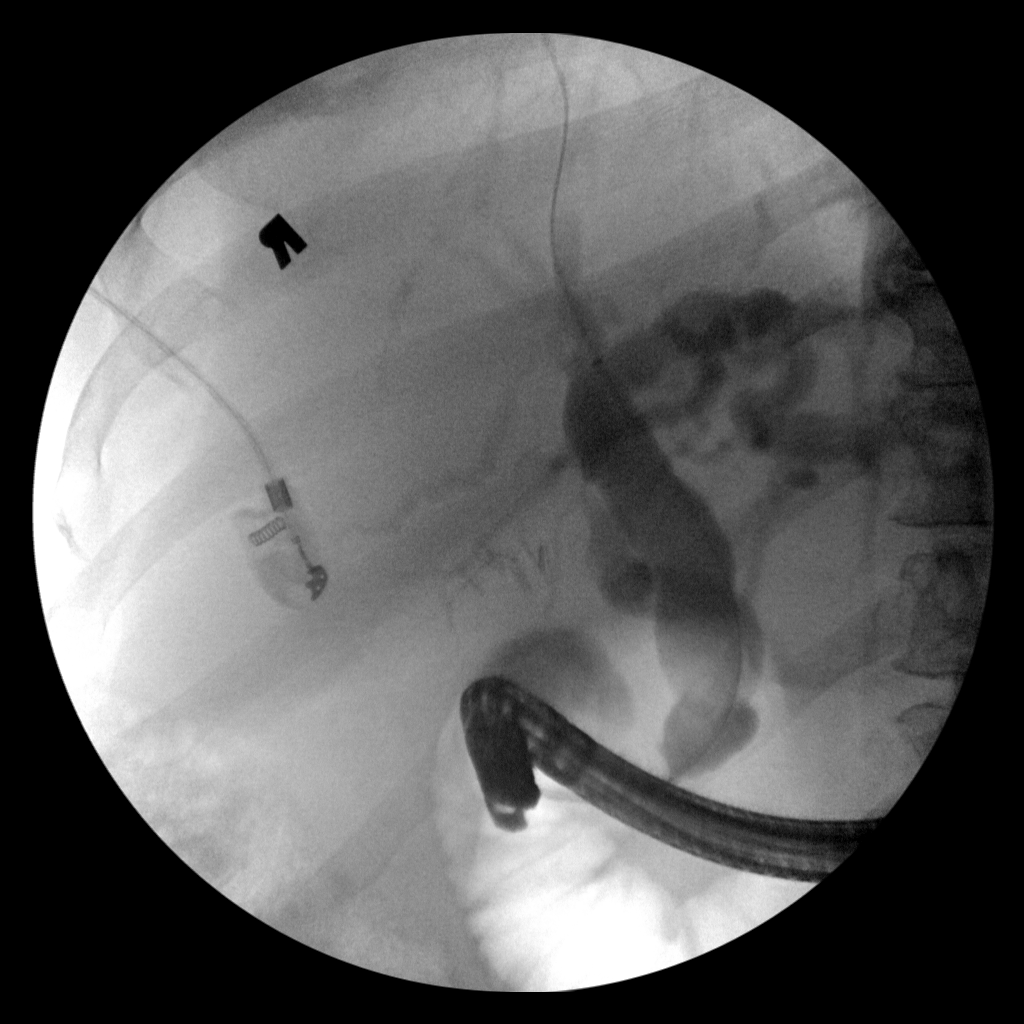
[im 4/4]
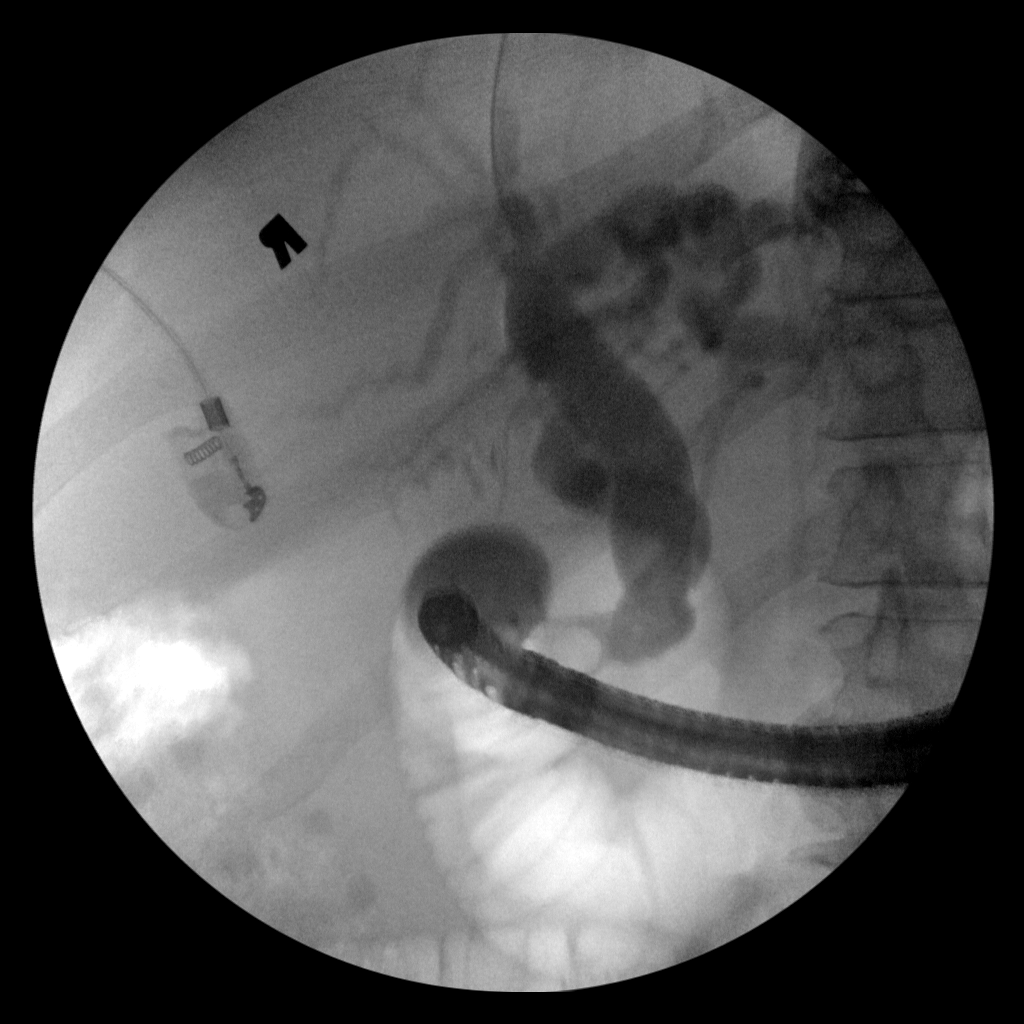

[4 of 4 positions shown; findings below may reference images not displayed]

FINDINGS: Four fluoroscopic intraoperative coned-down spot views of
the right upper quadrant are provided.  On the initial image an
endoscope is in place, there is wire access into the intrahepatic
biliary tree and contrast is injected demonstrating a dilated CBD.
The distal filling defects are not identified today.  There are
surgical clips at the level of the cystic duct.  The left
intrahepatic ducts are dilated.  The right intrahepatic ducts do
not appear dilated.  There is some contrast spill in the duodenum.
IMPRESSION: 1.  Persistent CBD and left intrahepatic ductal dilatation.  No
definite filling defects today.
2.Correlate with ERCP findings.

## 2010-11-02 NOTE — Progress Notes (Signed)
  Phone Note Call from Patient Call back at Home Phone [phone]   Caller: [name] Summary of Call: Pt's spouse  callled to say pt wants to start taking,  Welchol again. Pt never started as he could not afford. Insurance told pt they would cover 3 mos supply under catostropic. [facility], fax#[phone], need cover letter w/dob,[employer] [phone]. Initial call taken by: Denice Paradise,  September 11, 2010 11:08 AM    New/Updated Medications: WELCHOL 3.75 GM PACK (COLESEVELAM HCL) 1 pack by mouth in water once daily Prescriptions: WELCHOL 3.75 GM PACK (COLESEVELAM HCL) 1 pack by mouth in water once daily  #90 x 3   Entered by:   Shirlean Mylar Ewing CMA ([place])   Authorized by:   Biagio Borg MD   Signed by:   Sharon Seller CMA ([place]) on 09/11/2010   Method used:   Faxed to ...       [facility] (mail-order)             , [place]         Ph: [phone]       Fax: [phone]   RxID:   [id number]

## 2010-11-03 ENCOUNTER — Other Ambulatory Visit: Payer: Self-pay

## 2010-11-03 ENCOUNTER — Ambulatory Visit: Admit: 2010-11-03 | Payer: Self-pay | Admitting: Internal Medicine

## 2010-11-08 NOTE — Medication Information (Signed)
Summary: [facility] Supp[facility] / [place]   [facility] / [place]   Imported By: Rise Patience 10/31/2010 14:21:15  _____________________________________________________________________  External Attachment:    Type:   Image     Comment:   External Document

## 2010-11-10 ENCOUNTER — Encounter: Payer: Self-pay | Admitting: Endocrinology

## 2010-11-10 ENCOUNTER — Other Ambulatory Visit: Payer: Self-pay | Admitting: Endocrinology

## 2010-11-10 ENCOUNTER — Ambulatory Visit (INDEPENDENT_AMBULATORY_CARE_PROVIDER_SITE_OTHER): Payer: Medicare HMO | Admitting: Endocrinology

## 2010-11-10 ENCOUNTER — Encounter (INDEPENDENT_AMBULATORY_CARE_PROVIDER_SITE_OTHER): Payer: Self-pay | Admitting: *Deleted

## 2010-11-10 ENCOUNTER — Other Ambulatory Visit: Payer: Medicare HMO

## 2010-11-10 DIAGNOSIS — E119 Type 2 diabetes mellitus without complications: Secondary | ICD-10-CM

## 2010-11-10 DIAGNOSIS — E0590 Thyrotoxicosis, unspecified without thyrotoxic crisis or storm: Secondary | ICD-10-CM

## 2010-11-10 DIAGNOSIS — E785 Hyperlipidemia, unspecified: Secondary | ICD-10-CM

## 2010-11-10 LAB — LDL CHOLESTEROL, DIRECT: Direct LDL: 221.4 mg/dL

## 2010-11-10 LAB — TSH: TSH: 0.28 u[IU]/mL — ABNORMAL LOW (ref 0.35–5.50)

## 2010-11-10 LAB — LIPID PANEL
HDL: 47 mg/dL (ref >39.00)
VLDL: 58.6 mg/dL — ABNORMAL HIGH (ref 0.0–40.0)

## 2010-11-10 IMAGING — RF DG ERCP WO/W SPHINCTEROTOMY
1 series · 6 of 6 positions shown · non-contrast
Comparison: none

CLINICAL DATA: Sepsis syndrome

Exam:  ERCP with sphincterotomy:
Guidewire was inserted up into the intrahepatic ducts.
Sphincterotomy was performed.  There was a balloon dilatation and
placement of a biliary stent.

[Series 1: run · 6 of 6 slices shown]
[im 1/6]
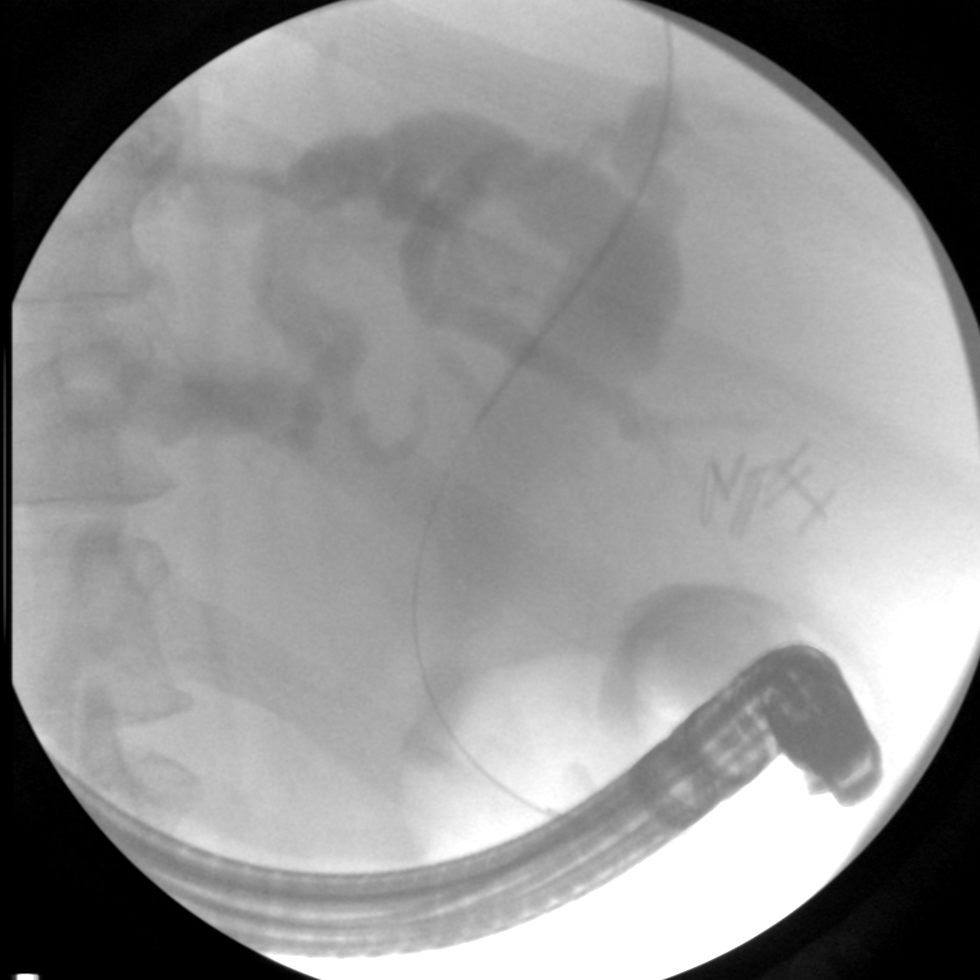
[im 2/6]
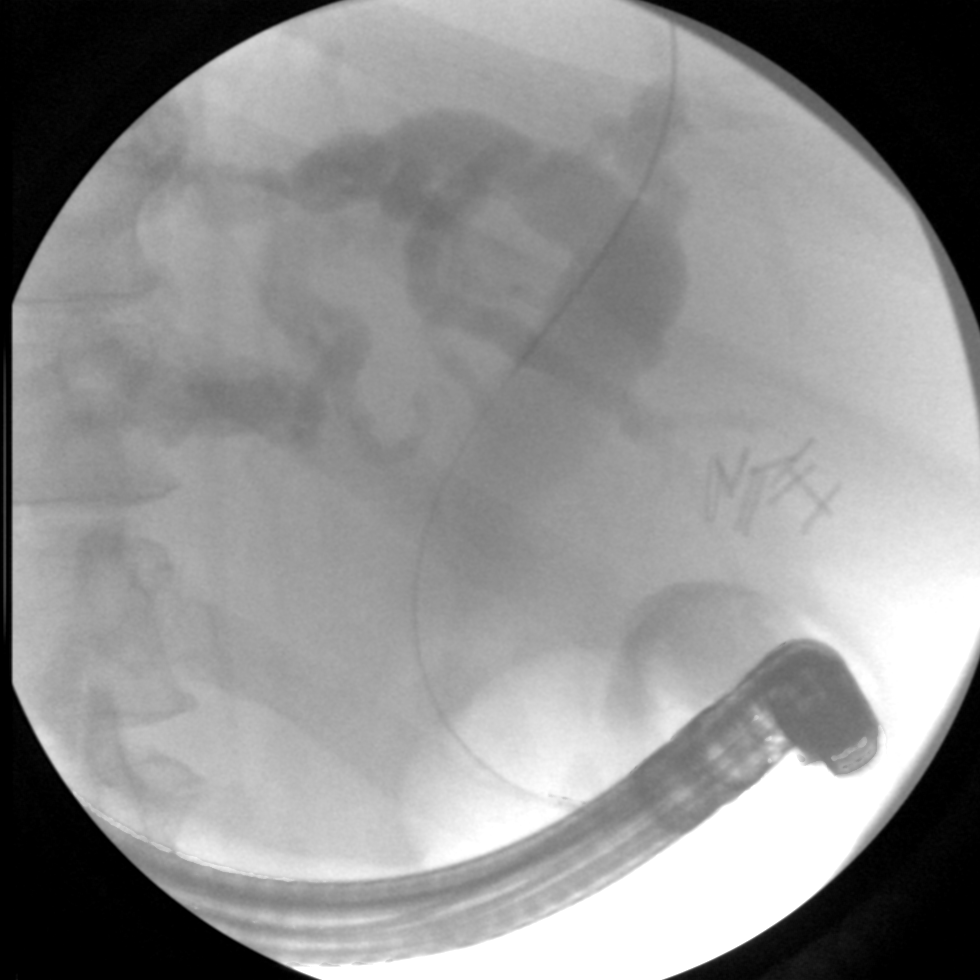
[im 3/6]
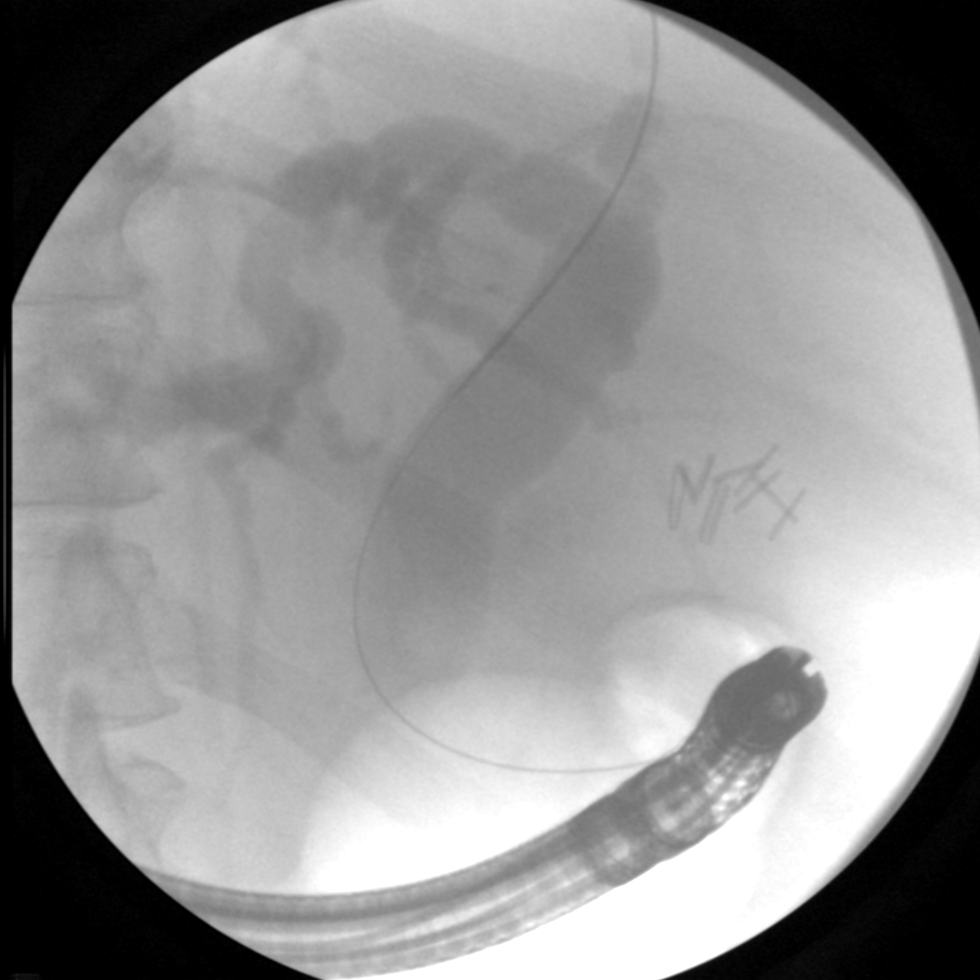
[im 4/6]
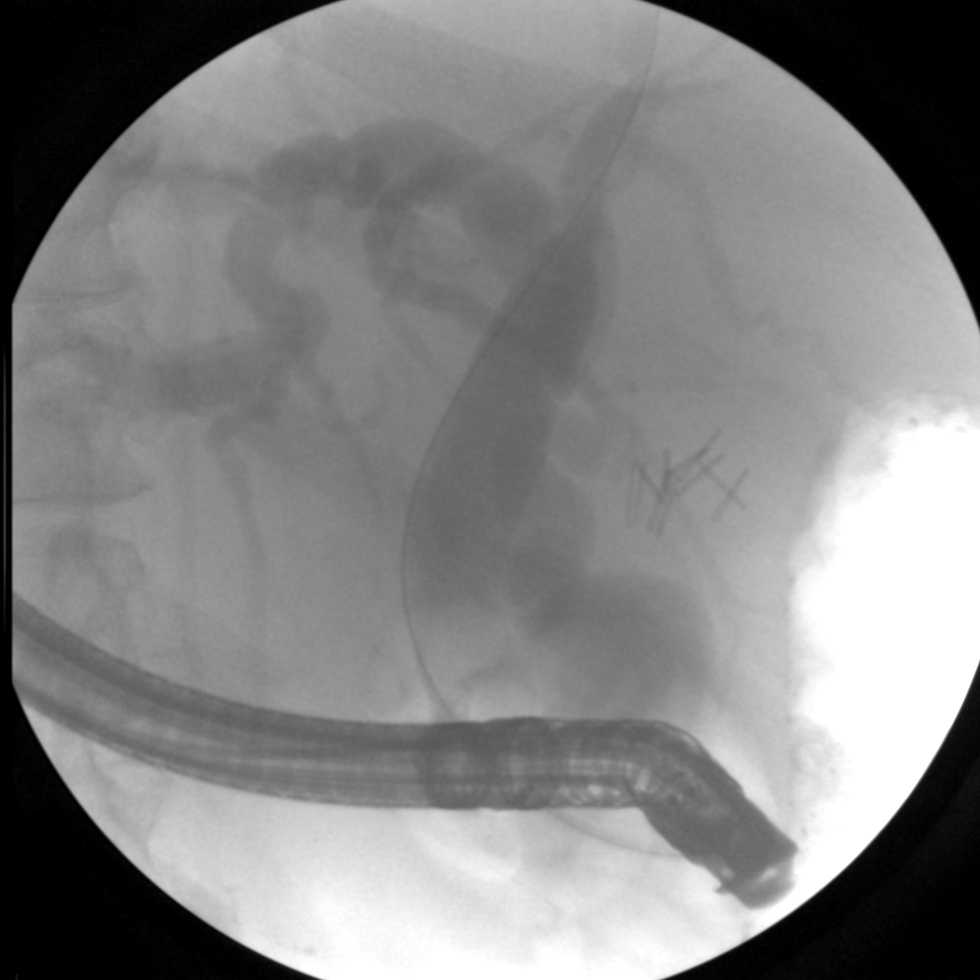
[im 5/6]
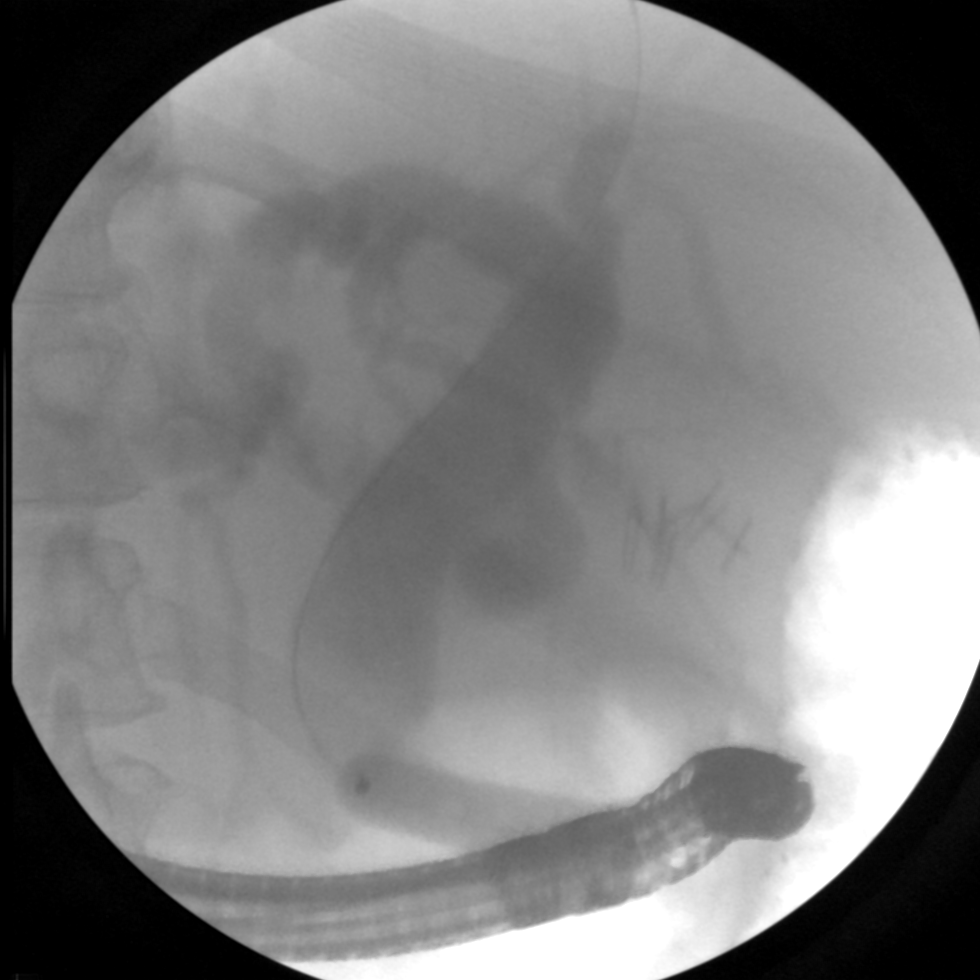
[im 6/6]
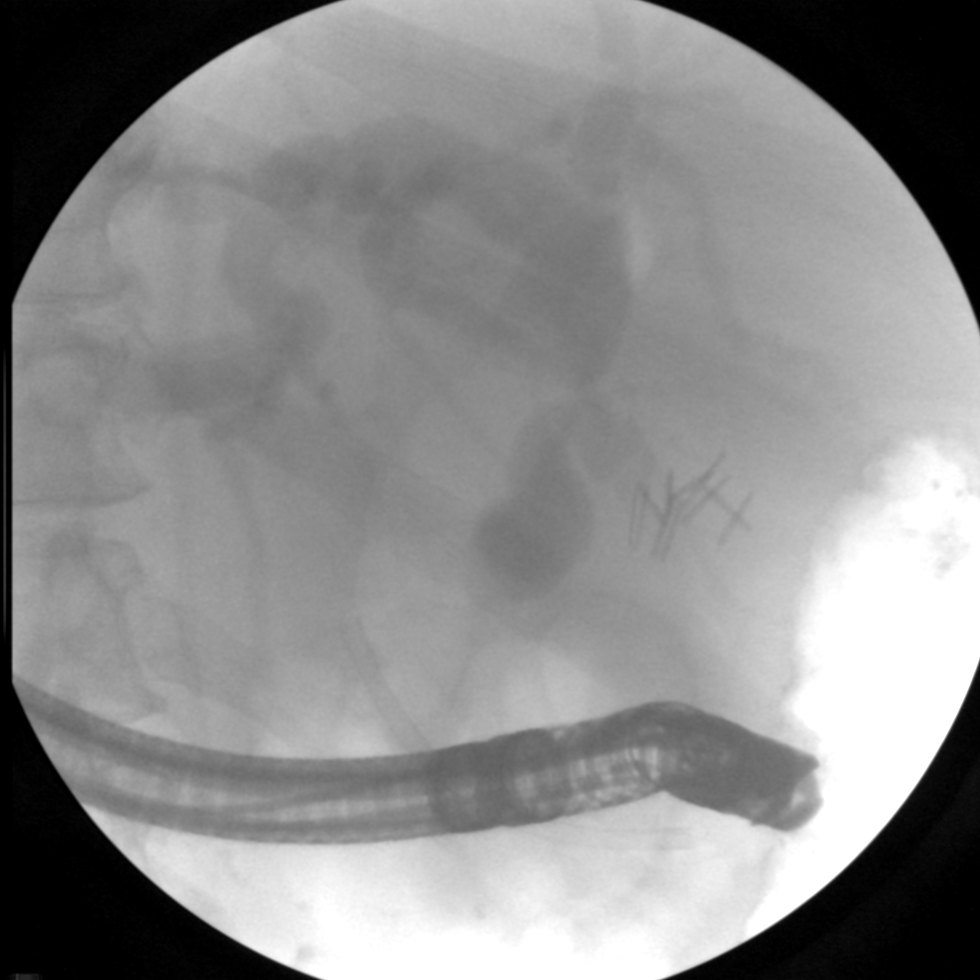

[6 of 6 positions shown; findings below may reference images not displayed]

IMPRESSION: Successful ERCP with sphincterotomy, balloon
dilatation, and stent placement.

## 2010-11-16 ENCOUNTER — Other Ambulatory Visit: Payer: Self-pay | Admitting: Internal Medicine

## 2010-11-16 DIAGNOSIS — E0590 Thyrotoxicosis, unspecified without thyrotoxic crisis or storm: Secondary | ICD-10-CM

## 2010-11-16 NOTE — Assessment & Plan Note (Signed)
Summary: 3 mos f/u # cd   Vital Signs:  Patient profile:   60 year old male Height:      73 inches (185.42 cm) Weight:      247.25 pounds (112.39 kg) BMI:     32.74 O2 Sat:      94 % on Room air Temp:     98.4 degrees F (36.89 degrees C) oral Pulse rate:   84 / minute Pulse rhythm:   regular BP sitting:   128 / 78  (left arm) Cuff size:   large  Vitals Entered By: Rebeca Alert CMA Deborra Medina) (November 10, 2010 1:27 PM)  O2 Flow:  Room air CC: 3 month F/U/pt is no longer taking Flexeril, Prednisone or Sensipar/aj Is Patient Diabetic? Yes   Referring Provider:  Dr Wallene Huh Primary Provider:  Biagio Borg MD  CC:  3 month F/U/pt is no longer taking Flexeril and Prednisone or Sensipar/aj.  History of Present Illness: the status of at least 3 ongoing medical problems is addressed today: hyperthyroidism:  denies weight loss dm:  he brings a record of his cbg's which i have reviewed today.  he checks only in the am.  it is approx 100.  denies hypoglycemia.  dyslipidemia:  denies weight gain.   Current Medications (verified): 1)  Carvedilol 12.5 Mg Tabs (Carvedilol) .[name].. 1po Two Times A Day 2)  Adult Aspirin Low Strength 81 Mg  Tbdp (Aspirin) .... Take 1 By Mouth Qd 3)  Plavix 75 Mg  Tabs (Clopidogrel Bisulfate) .... Take 1 By Mouth Once Daily 4)  Rena-Vite  Tabs (B Complex-C-Folic Acid) .... Take 1 Tablet By Mouth Once A Day 5)  Humulin 70/30 70-30 % Susp (Insulin Isophane & Regular) .... 30 Units Before Breakfast 6)  A1c 250.41 7)  Gabapentin 300 Mg Caps (Gabapentin) .... Take One At Bedtine 8)  Epogen 3000 Unit/ml Soln (Epoetin Alfa) .... At Dialysis 9)  Oscal 500/200 D-3 500-200 Mg-Unit Tabs (Calcium-Vitamin D) .... 2 By Mouth 3 Times A Week 10)  Ranitidine Hcl 300 Mg Caps (Ranitidine Hcl) .[name].. 1 By Mouth Daily 11)  Sensipar 90 Mg Tabs (Cinacalcet Hcl) .[name].. 1 By Mouth Once Daily 12)  Allopurinol 100 Mg Tabs (Allopurinol) .... Take One Daily 13)  Fish Oil 1000 Mg Caps  (Omega-3 Fatty Acids) .... Take Three Daily 14)  Flexeril 5 Mg Tabs (Cyclobenzaprine Hcl) .[name].. 1 By Mouth Three Times A Day As Needed 15)  Tramadol Hcl 50 Mg Tabs (Tramadol Hcl) .[name].. 1  By Mouth Q 6 Hrs As Needed 16)  Prednisone 10 Mg Tabs (Prednisone) .... 3po Qd For 3days, Then 2po Qd For 3days, Then 1po Qd For 3days, Then Stop 17)  Welchol 3.75 Gm Pack (Colesevelam Hcl) .[name].. 1 Pack By Mouth in Water Once Daily  Allergies (verified): 1)  ! Keflex 2)  ! * Statins  Past History:  Past Medical History: Last updated: 07/17/2010  1. Morbid obesity  2. Diabetes  3. CHF, primarily diastolic      --EF previously 37%      --last echo. 55% with mild TR      --CPX: VO2 12.0. slope of 45. Corrected to ideal body weight VO2 = 18.0      --[facility] 2/11: RA 7 RV 36/10/13 PA 26/16 (22) pcwp 2 PA sats 55% and 58% Fick 5.6/2.3  4. ESRD  - dialysis dec 2009  5. OSA on CPAP  6. COPD on nightime O2  7. Myositis with a sed rate  of 100       -statin stopped  8. Hyperlipidemia  9.Hypertension 10.Gout 12. Diabetes     --c/b peripheral neurpoathy 13. GERD 14. Elevated R hemidiaphragm 15. Chronic dyspnea, multifactorial  ROUTINE GENERAL MEDICAL EXAM@HEALTH  CARE FACL (ICD-V70.0) SPECIAL SCREENING MALIGNANT NEOPLASM OF PROSTATE (ICD-V76.44) FOOT PAIN (ICD-729.5) GOITER, MULTINODULAR (ICD-241.1) ONYCHOMYCOSIS, TOENAILS (ICD-110.1) Benign prostatic hypertrophy Depression GERD cervical stenosis Colonic polyps, hx of Bipolar disease Gout Dementia Left knee djd - severe, end stage Diabetes mellitus, type II  Review of Systems  The patient denies hypoglycemia and dyspnea on exertion.    Physical Exam  General:  normal appearance.   Pulses:  dorsalis pedis intact bilat.  Extremities:  no deformity.  no ulcer on the feet.  feet are of normal color and temp.  no edema  Neurologic:  sensation is intact to touch on the feet.  Additional Exam:  FastTSH              [L]  0.28 uIU/mL     Hemoglobin  A1C            5.4 %  Cholesterol LDL   221.4 mg/dL   Impression & Recommendations:  Problem # 1:  DIABETES MELLITUS, TYPE II (ICD-250.00) overcontrolled, given this regimen, which does match insulin to her changing needs throughout the day  Problem # 2:  HYPERTHYROIDISM (ICD-242.90) recurrent  Problem # 3:  DYSLIPIDEMIA (ICD-272.4) needs increased rx  Medications Added to Medication List This Visit: 1)  Humulin 70/30 70-30 % Susp (Insulin isophane & regular) .... 25 units before breakfast  Other Orders: i-131 Thyroid uptake and scan (Capsule & Scan) Nuc Med Diagnostic (thyroid uptake & sca) TLB-TSH (Thyroid Stimulating Hormone) (84443-TSH) TLB-Lipid Panel (80061-LIPID) TLB-A1C / Hgb A1C (Glycohemoglobin) (83036-A1C) Est. Patient Level IV [id number])  Patient Instructions: 1)  blood tests are being ordered for you today.  please call [phone] to hear your test results. 2)  pending the test results, please continue the same medications for now. 3)  Please schedule a follow-up appointment in 3 months. 4)  please also see dr Wille Glaser soon, as you are due.  5)  (update: i left message on phone-tree:  reduce insulin to 25 units each am.  i ordered thyroid scan.  you chol is is high despite welchol).   Orders Added: 1)  i-131 Thyroid uptake and scan (Capsule & Scan) Nuc Med Diagnostic [thyroid uptake & sca] 2)  TLB-TSH (Thyroid Stimulating Hormone) [84443-TSH] 3)  TLB-Lipid Panel [80061-LIPID] 4)  TLB-A1C / Hgb A1C (Glycohemoglobin) [83036-A1C] 5)  Est. Patient Level IV [id number]

## 2010-11-17 ENCOUNTER — Ambulatory Visit (INDEPENDENT_AMBULATORY_CARE_PROVIDER_SITE_OTHER): Payer: Medicare HMO | Admitting: Vascular Surgery

## 2010-11-17 DIAGNOSIS — N186 End stage renal disease: Secondary | ICD-10-CM

## 2010-11-17 NOTE — Assessment & Plan Note (Signed)
OFFICE VISIT  [name], [name] DOB:  Dec 26, 1950                                       11/17/2010 [id number]  This is an established patient postoperative followup.  HISTORY OF PRESENT ILLNEESS:  I completed a the first stage of a basilic vein transposition on the left side 05/31/2010 then he underwent a second stage basilic vein transposition on 10/09/2010.  He now presents for postoperative followup.  In reviewing the chart it looks like he saw Dr. Scot Dock for a hematoma which at this point has resolved.  No fevers or chills were noted.  He notes a little bit of pain in the incision site at the upper end and also a small amount of numbness on the forearm more on the ulnar side which is improving.  He is able to complete his activities of daily living with his left arm and denies any steal symptomatology.  He is continuing to dialyze from his right forearm arteriovenous graft.  PHYSICAL EXAMINATION:  Vital signs:  He had a blood pressure 173/93, a heart rate of 83, respirations were 12.  On focused exam:  On the right arm he has a right arteriovenous graft which has a palpable thrill throughout and on auscultation good bruits without any high pitched sounds.  In the left arm his incision has healed well and there is no obvious edema in this arm and no obvious fluctuance.  There is a palpable thrill in this transposed basilic vein transposition.  I interrogated this with an ultrasound.  There are a few segments that are about 5.5 cm in diameter but most of this basilic vein now is ranging from 6.5 mm up to 8 mm at some areas.  The depth of this is less than 6 mm throughout the length of this.  It appears on ultrasound to be larger than when I first transposed it.  There were intraoperatively some segments that were only about 3 mm so the additional time and ligation of competing side branches have successfully dilated most of this fistula.  MEDICAL  DECISION MAKING:  This is a 60 year old gentleman who now has a functional right forearm arteriovenous graft.  This has mid cephalic vein stenoses that I had to previously angioplastied to bridge this access until his left arm was ready.  At this point it appears that his transposed basilic vein in left arm is ready for use.  I would preferably continue with the right arm graft until it fails.  This will give the left arm some additional time to dilate further as there are a few areas that are still below the 6 mm diameter.  At this point this patient is not presenting with symptomatology consistent with congestive heart failure so I do not see any need to electively ligate the right arteriovenous graft.  At this point I am going to release this patient to have this left arm cannulated as needed.  He can follow up as needed for additional access needs.    [name], MD Electronically Signed  BLC/MEDQ  D:  11/17/2010  T:  11/17/2010  Job:  [id number]

## 2010-11-27 ENCOUNTER — Encounter: Payer: Self-pay | Admitting: Internal Medicine

## 2010-11-27 ENCOUNTER — Ambulatory Visit (INDEPENDENT_AMBULATORY_CARE_PROVIDER_SITE_OTHER): Payer: Medicare HMO | Admitting: Internal Medicine

## 2010-11-27 DIAGNOSIS — I2510 Atherosclerotic heart disease of native coronary artery without angina pectoris: Secondary | ICD-10-CM

## 2010-11-27 DIAGNOSIS — I5022 Chronic systolic (congestive) heart failure: Secondary | ICD-10-CM

## 2010-11-30 ENCOUNTER — Ambulatory Visit (HOSPITAL_COMMUNITY): Payer: Medicare HMO

## 2010-12-01 ENCOUNTER — Other Ambulatory Visit (HOSPITAL_COMMUNITY): Payer: Medicare HMO

## 2010-12-07 NOTE — Assessment & Plan Note (Signed)
Summary: f24m  per pt wife [name] call-mj   Visit Type:  Follow-up Referring Provider:  Dr Wallene Huh Primary Provider:  Biagio Borg MD  CC:  shortness of breath and .  History of Present Illness: [name] is 60 year old male with multiple medical problems including morbid obesity, diabetes, myositis with a sed rate > 100, COPD on nighttime oxygen, sleep apnea on CPAP, end-stage renal disease on HD.  We follow him for coronary artery disease, status post Taxus drug-eluting stent to the right coronary in September 2008, and a previous history of congestive heart failure secondary to ischemic cardiomyopathy with an EF of 37%. Most recent echocardiogram September 2010  however, showed an EF of 55% with mild diastolic dysfunction. Myoview had EF of 43% with inferior scar. no ischemia. Had [place] in 2/11 which showed low pressures (PCWP = 2) , PA 26/16 (22)  and normal cardiac output.  Recently had clot in RUE graft so placed AVF in LUE. Subsequently RUE graft re-opened by itself. Referred by Dr. Bridgett Larsson to make sure having 2 fistulas no too much of a stress on his heart. Feels good. Remains with class II-III exertional dyspnea. No edema. BP still going low during HD - unclear if this is worse since 2nd fistula was placed. No edema, orthopnea or PND. Wearing CPAP.   Current Medications (verified): 1)  Carvedilol 12.5 Mg Tabs (Carvedilol) .[name].. 1po Two Times A Day 2)  Adult Aspirin Low Strength 81 Mg  Tbdp (Aspirin) .... Take 1 By Mouth Qd 3)  Plavix 75 Mg  Tabs (Clopidogrel Bisulfate) .... Take 1 By Mouth Once Daily 4)  Rena-Vite  Tabs (B Complex-C-Folic Acid) .... Take 1 Tablet By Mouth Once A Day 5)  Humulin 70/30 70-30 % Susp (Insulin Isophane & Regular) .... 25 Units Before Breakfast 6)  A1c 250.41 7)  Gabapentin 300 Mg Caps (Gabapentin) .... Take One At Bedtine 8)  Epogen 3000 Unit/ml Soln (Epoetin Alfa) .... At Dialysis 9)  Oscal 500/200 D-3 500-200 Mg-Unit Tabs (Calcium-Vitamin D) .... 2 By  Mouth 3 Times A Week 10)  Ranitidine Hcl 300 Mg Caps (Ranitidine Hcl) .[name].. 1 By Mouth Daily 11)  Sensipar 90 Mg Tabs (Cinacalcet Hcl) .[name].. 1 By Mouth Once Daily 12)  Allopurinol 100 Mg Tabs (Allopurinol) .... Take One Daily 13)  Fish Oil 1000 Mg Caps (Omega-3 Fatty Acids) .... Take Three Daily 14)  Welchol 3.75 Gm Pack (Colesevelam Hcl) .[name].. 1 Pack By Mouth in Water Once Daily  Allergies (verified): 1)  ! Keflex 2)  ! * Statins  Review of Systems       As per HPI and past medical history; otherwise all systems negative.   Vital Signs:  Patient profile:   60 year old male Height:      73 inches Weight:      253.50 pounds BMI:     33.57 Pulse rate:   80 / minute BP sitting:   142 / 90  (right arm) Cuff size:   large  Vitals Entered By: Hansel Feinstein CMA (November 27, 2010 10:15 AM)  Physical Exam  General:  no acute distress. no resp difficulty. looks good. HEENT: normal Neck: supple. no JVD. Carotids 2+ bilat; no bruits.  Cor: PMI nondisplaced. Regular rate & rhythm. No rubs, +s4, 2/6 SEM RSB Abdomen: obese. soft, nontender, nondistended. umbilical wound healing well. good bowel sounds. Extremities: no cyanosis, clubbing, rash, edema. Bilateral UE fistulas with bruit Neuro: alert & orientedx3, cranial nerves grossly intact. moves all  4 extremities w/o difficulty. affect pleasant    Impression & Recommendations:  Problem # 1:  DIASTOLIC HEART FAILURE, CHRONIC (ICD-428.32) Volume status looks good. Due for repeat echo. No evidence of high output HF with bilateral upper extremity fistulas. Will continue to follow. If hypotension worsening during HD then may need to condier ligation of one of the fistulas. Will f/u with Drs. Bridgett Larsson and Pattison. Continue b-blocker. No ACE-I to due ESRD and hypotensiong during HD.   Problem # 2:  CAD, NATIVE VESSEL (ICD-414.01) Stable. No evidence of ischemia. Continue current regimen.  Other Orders: EKG w/ Interpretation  (93000) Echocardiogram (Echo)  Patient Instructions: 1)  Your physician wants you to follow-up in: 6 months  You will receive a reminder letter in the mail two months in advance. If you don't receive a letter, please call our office to schedule the follow-up appointment. 2)  Your physician has requested that you have an echocardiogram.  Echocardiography is a painless test that uses sound waves to create images of your heart. It provides your doctor with information about the size and shape of your heart and how well your heart's chambers and valves are working.  This procedure takes approximately one hour. There are no restrictions for this procedure.

## 2010-12-08 ENCOUNTER — Ambulatory Visit (INDEPENDENT_AMBULATORY_CARE_PROVIDER_SITE_OTHER): Payer: Medicare HMO | Admitting: Internal Medicine

## 2010-12-08 ENCOUNTER — Ambulatory Visit: Payer: Medicare HMO | Admitting: Internal Medicine

## 2010-12-08 ENCOUNTER — Encounter: Payer: Self-pay | Admitting: Internal Medicine

## 2010-12-08 DIAGNOSIS — E785 Hyperlipidemia, unspecified: Secondary | ICD-10-CM

## 2010-12-08 DIAGNOSIS — F068 Other specified mental disorders due to known physiological condition: Secondary | ICD-10-CM

## 2010-12-08 DIAGNOSIS — I10 Essential (primary) hypertension: Secondary | ICD-10-CM

## 2010-12-08 DIAGNOSIS — F329 Major depressive disorder, single episode, unspecified: Secondary | ICD-10-CM

## 2010-12-11 ENCOUNTER — Encounter (HOSPITAL_COMMUNITY)
Admission: RE | Admit: 2010-12-11 | Discharge: 2010-12-11 | Disposition: A | Discharge status: Home / Self Care | Payer: Medicare HMO | Source: Ambulatory Visit | Attending: Internal Medicine | Admitting: Internal Medicine

## 2010-12-11 DIAGNOSIS — E0590 Thyrotoxicosis, unspecified without thyrotoxic crisis or storm: Secondary | ICD-10-CM | POA: Insufficient documentation

## 2010-12-11 LAB — GLUCOSE, CAPILLARY: Glucose-Capillary: 96 mg/dL (ref 70–99)

## 2010-12-11 LAB — POCT I-STAT, CHEM 8
BUN: 45 mg/dL — ABNORMAL HIGH (ref 6–23)
Calcium, Ion: 1.07 mmol/L — ABNORMAL LOW (ref 1.12–1.32)
Glucose, Bld: 104 mg/dL — ABNORMAL HIGH (ref 70–99)
TCO2: 34 mmol/L (ref 0–100)

## 2010-12-12 ENCOUNTER — Ambulatory Visit (HOSPITAL_COMMUNITY): Admission: RE | Admit: 2010-12-12 | Payer: Medicare HMO | Source: Ambulatory Visit

## 2010-12-12 ENCOUNTER — Encounter (HOSPITAL_COMMUNITY): Payer: Self-pay

## 2010-12-12 HISTORY — DX: Thyrotoxicosis, unspecified without thyrotoxic crisis or storm: E05.90

## 2010-12-12 LAB — CLOSTRIDIUM DIFFICILE EIA: C difficile Toxins A+B, EIA: NEGATIVE

## 2010-12-12 LAB — COMPREHENSIVE METABOLIC PANEL
Albumin: 3.7 g/dL (ref 3.5–5.2)
Alkaline Phosphatase: 83 U/L (ref 39–117)
BUN: 27 mg/dL — ABNORMAL HIGH (ref 6–23)
CO2: 33 mEq/L — ABNORMAL HIGH (ref 19–32)
Chloride: 96 mEq/L (ref 96–112)
Creatinine, Ser: 6.67 mg/dL — ABNORMAL HIGH (ref 0.4–1.5)
GFR calc non Af Amer: 9 mL/min — ABNORMAL LOW (ref >60)
Glucose, Bld: 93 mg/dL (ref 70–99)
Potassium: 4.6 mEq/L (ref 3.5–5.1)
Total Bilirubin: 0.6 mg/dL (ref 0.3–1.2)

## 2010-12-12 LAB — DIFFERENTIAL
Basophils Absolute: 0 10*3/uL (ref 0.0–0.1)
Basophils Relative: 0 % (ref 0–1)
Lymphocytes Relative: 25 % (ref 12–46)
Neutro Abs: 5.7 10*3/uL (ref 1.7–7.7)
Neutrophils Relative %: 63 % (ref 43–77)

## 2010-12-12 LAB — CBC
HCT: 40.3 % (ref 39.0–52.0)
MCH: 29.2 pg (ref 26.0–34.0)
MCV: 94.2 fL (ref 78.0–100.0)
Platelets: 265 10*3/uL (ref 150–400)
RBC: 4.28 MIL/uL (ref 4.22–5.81)
WBC: 9 10*3/uL (ref 4.0–10.5)

## 2010-12-12 MED ORDER — SODIUM IODIDE I 131 CAPSULE
20.6000 | Freq: Once | INTRAVENOUS | Status: AC | PRN
  Administered 2010-12-11: 20.6 via ORAL

## 2010-12-12 MED ORDER — SODIUM PERTECHNETATE TC 99M INJECTION
10.4000 | Freq: Once | INTRAVENOUS | Status: AC | PRN
Start: 2010-12-12 — End: 2010-12-12
  Administered 2010-12-12: 10.4 via INTRAVENOUS

## 2010-12-12 NOTE — Assessment & Plan Note (Signed)
Summary: PER WIFE MAR APPT  D/T   STC   Vital Signs:  Patient profile:   60 year old male Height:      73 inches Weight:      249.13 pounds BMI:     32.99 O2 Sat:      96 % on Room air Temp:     98.2 degrees F oral Pulse rate:   88 / minute BP sitting:   120 / 72  (left arm) Cuff size:   large  Vitals Entered By: Shirlean Mylar Ewing CMA Deborra Medina) (December 08, 2010 2:32 PM)  O2 Flow:  Room air  CC: Labs and BP/RE   Primary Care Provider:  Biagio Borg MD  CC:  Labs and BP/RE.  History of Present Illness: her to f/uy; overall doing ok, wife here as before to help with hx;  Pt denies CP, worsening sob, doe, wheezing, orthopnea, pnd, worsening LE edema, palps, dizziness or syncope  Pt denies new neuro symptoms such as headache, facial or extremity weakness  Pt denies polydipsia, polyuria, or low sugar symptoms such as shakiness improved with eating.  Overall good compliance with meds, trying to follow low chol, DM diet, wt stable, little excercise however  CBG's varable in the mid 100's.  No fever, wt loss, night sweats, loss of appetite or other constitutional symptoms  Overall good compliance with meds, and good tolerability.  Denies worsening depressive symptoms, suicidal ideation, or panic, though has some intermittent anxiety, though not worse recnetly.  No recent worsening memory loss related agitation, paranoia or hallucinations.   Has had statin intolerance but has not tried alternate dosing such as every other day dosing.    Problems Prior to Update: 1)  Hyperlipidemia  (ICD-272.4) 2)  Esrd  (ICD-585.6) 3)  Neck Pain  (ICD-723.1) 4)  Dizziness  (ICD-780.4) 5)  Gynecomastia  (ICD-611.1) 6)  Hypotension, Unspecified  (ICD-458.9) 7)  Hypocalcemia  (ICD-275.41) 8)  Preventive Health Care  (ICD-V70.0) 9)  Chest Pain  (ICD-786.50) 10)  Gait Disturbance  (ICD-781.2) 11)  Hyperthyroidism  (ICD-242.90) 12)  Transaminases, Serum, Elevated  (ICD-790.4) 13)  Diabetes Mellitus, Type II   (ICD-250.00) 14)  Unspecified Hypotension  (ICD-458.9) 15)  Fatigue  (ICD-780.79) 16)  Hypotension, Unspecified  (ICD-458.9) 17)  Fatigue / Malaise  (ICD-780.79) 18)  Gastroenteritis, Viral  (ICD-008.8) 19)  Preventive Health Care  (ICD-V70.0) 20)  Colonic Polyps, Hx of  (ICD-V12.72) 21)  Depression  (ICD-311) 22)  Benign Prostatic Hypertrophy  (ICD-600.00) 23)  Dementia  (ICD-294.8) 24)  Pre-operative Cardiac Exam  (ICD-V72.81) 25)  Pulmonary Nodule, Right Lower Lobe  ([id number]) 26)  Diastolic Heart Failure, Chronic  (ICD-428.32) 27)  Cad, Native Vessel  (ICD-414.01) 28)  Routine General Medical Exam@health  Care Facl  (ICD-V70.0) 29)  Special Screening Malignant Neoplasm of Prostate  (ICD-V76.44) 30)  Dyspnea  (ICD-786.05) 31)  Foot Pain  (ICD-729.5) 32)  Goiter, Multinodular  (ICD-241.1) 33)  Onychomycosis, Toenails  (ICD-110.1) 34)  Dyslipidemia  (ICD-272.4) 35)  Peripheral Neuropathy  (ICD-356.9) 36)  Hypertension  (ICD-401.9) 37)  Gout  (ICD-274.9) 38)  Gerd  (ICD-530.81) 39)  Congestive Heart Failure  (ICD-428.0)  Medications Prior to Update: 1)  Carvedilol 12.5 Mg Tabs (Carvedilol) .[name].. 1po Two Times A Day 2)  Adult Aspirin Low Strength 81 Mg  Tbdp (Aspirin) .... Take 1 By Mouth Qd 3)  Plavix 75 Mg  Tabs (Clopidogrel Bisulfate) .... Take 1 By Mouth Once Daily 4)  Rena-Vite  Tabs (B Complex-C-Folic Acid) .Marland KitchenMarland KitchenMarland Kitchen  Take 1 Tablet By Mouth Once A Day 5)  Humulin 70/30 70-30 % Susp (Insulin Isophane & Regular) .... 25 Units Before Breakfast 6)  A1c 250.41 7)  Gabapentin 300 Mg Caps (Gabapentin) .... Take One At Bedtine 8)  Epogen 3000 Unit/ml Soln (Epoetin Alfa) .... At Dialysis 9)  Oscal 500/200 D-3 500-200 Mg-Unit Tabs (Calcium-Vitamin D) .... 2 By Mouth 3 Times A Week 10)  Ranitidine Hcl 300 Mg Caps (Ranitidine Hcl) .[name].. 1 By Mouth Daily 11)  Sensipar 90 Mg Tabs (Cinacalcet Hcl) .[name].. 1 By Mouth Once Daily 12)  Allopurinol 100 Mg Tabs (Allopurinol) .... Take One Daily 13)   Fish Oil 1000 Mg Caps (Omega-3 Fatty Acids) .... Take Three Daily 14)  Welchol 3.75 Gm Pack (Colesevelam Hcl) .[name].. 1 Pack By Mouth in Water Once Daily  Current Medications (verified): 1)  Carvedilol 12.5 Mg Tabs (Carvedilol) .[name].. 1po Two Times A Day 2)  Adult Aspirin Low Strength 81 Mg  Tbdp (Aspirin) .... Take 1 By Mouth Qd 3)  Plavix 75 Mg  Tabs (Clopidogrel Bisulfate) .... Take 1 By Mouth Once Daily 4)  Rena-Vite  Tabs (B Complex-C-Folic Acid) .... Take 1 Tablet By Mouth Once A Day 5)  Humulin 70/30 70-30 % Susp (Insulin Isophane & Regular) .... 25 Units Before Breakfast 6)  A1c 250.41 7)  Gabapentin 300 Mg Caps (Gabapentin) .... Take One At Bedtine 8)  Epogen 3000 Unit/ml Soln (Epoetin Alfa) .... At Dialysis 9)  Oscal 500/200 D-3 500-200 Mg-Unit Tabs (Calcium-Vitamin D) .... 2 By Mouth 3 Times A Week 10)  Ranitidine Hcl 300 Mg Caps (Ranitidine Hcl) .[name].. 1 By Mouth Daily 11)  Sensipar 90 Mg Tabs (Cinacalcet Hcl) .[name].. 1 By Mouth Once Daily 12)  Allopurinol 100 Mg Tabs (Allopurinol) .... Take One Daily 13)  Fish Oil 1000 Mg Caps (Omega-3 Fatty Acids) .... Take Three Daily 14)  Welchol 3.75 Gm Pack (Colesevelam Hcl) .[name].. 1 Pack By Mouth in Water Once Daily 15)  Zetia 10 Mg Tabs (Ezetimibe) .[name].. 1 By Mouth Once Daily 16)  Atorvastatin Calcium 10 Mg Tabs (Atorvastatin Calcium) .[name].. 1 By Mouth Every Other Day  Allergies (verified): 1)  ! Keflex 2)  ! * Statins  Past History:  Past Surgical History: Last updated: 10/29/2008 stent-06/11/08 neck surgery-2/09 back surgery-1998  Social History: Last updated: 05/05/2010 Patient states former smoker. Quit smoking 2007.  Smoked x 25 years 1/2 ppd. Pt is married with children. Pt is disabled.  Formerly Lawyer for [employer]. Drug use-no  Risk Factors: Smoking Status: quit (06/18/2007)  Past Medical History:  1. Morbid obesity  2. Diabetes  3. CHF, primarily diastolic      --EF previously 37%      --last echo. 55% with  mild TR      --CPX: VO2 12.0. slope of 45. Corrected to ideal body weight VO2 = 18.0      --[facility] 2/11: RA 7 RV 36/10/13 PA 26/16 (22) pcwp 2 PA sats 55% and 58% Fick 5.6/2.3  4. ESRD  - dialysis dec 2009  5. OSA on CPAP  6. COPD on nightime O2  7. Myositis with a sed rate of 100       -statin stopped  8. Hyperlipidemia  9.Hypertension 10.Gout 12. Diabetes     --c/b peripheral neurpoathy 13. GERD 14. Elevated R hemidiaphragm 15. Chronic dyspnea, multifactorial ROUTINE GENERAL MEDICAL EXAM@HEALTH  CARE FACL (ICD-V70.0) SPECIAL SCREENING MALIGNANT NEOPLASM OF PROSTATE (ICD-V76.44) FOOT PAIN (ICD-729.5) GOITER, MULTINODULAR (ICD-241.1) ONYCHOMYCOSIS, TOENAILS (ICD-110.1) Benign  prostatic hypertrophy Depression GERD cervical stenosis Colonic polyps, hx of Bipolar disease Gout Dementia Left knee djd - severe, end stage Diabetes mellitus, type II Hyperlipidemia  Review of Systems       all otherwise negative per pt -    Physical Exam  General:  alert and overweight-appearing.   Head:  normocephalic and atraumatic.   Eyes:  vision grossly intact, pupils equal, and pupils round.   Ears:  R ear normal and L ear normal.   Nose:  no external deformity and no nasal discharge.   Mouth:  no gingival abnormalities and pharynx pink and moist.   Neck:  supple and no masses.   Lungs:  normal respiratory effort and normal breath sounds.   Heart:  normal rate and regular rhythm.   Extremities:  no edema, no erythema  Psych:  good eye contact, not anxious appearing, and not depressed appearing.     Impression & Recommendations:  Problem # 1:  HYPERLIPIDEMIA (P102836.4)  His updated medication list for this problem includes:    Welchol 3.75 Gm Pack (Colesevelam hcl) .[name]... 1 pack by mouth in water once daily    Zetia 10 Mg Tabs (Ezetimibe) .[name]... 1 by mouth once daily    Atorvastatin Calcium 10 Mg Tabs (Atorvastatin calcium) .[name]... 1 by mouth every other day treat as above, f/u any  worsening signs or symptoms , hopefully with every other day dosing can tolerate better   Labs Reviewed: SGOT: 19 (05/05/2010)   SGPT: 18 (05/05/2010)   HDL:47.00 (11/10/2010), 56.40 (05/05/2010)  Chol:333 (11/10/2010), 450 (05/05/2010)  Trig:293.0 (11/10/2010), 323.0 (05/05/2010)  Problem # 2:  DIABETES MELLITUS, TYPE II (ICD-250.00)  His updated medication list for this problem includes:    Adult Aspirin Low Strength 81 Mg Tbdp (Aspirin) .[name]... Take 1 by mouth qd    Humulin 70/30 70-30 % Susp (Insulin isophane & regular) .[name]... 25 units before breakfast  Labs Reviewed: Creat: 9.9 (05/05/2010)    Reviewed HgBA1c results: 5.4 (11/10/2010)  6.0 (08/04/2010) stable overall by hx and exam, ok to continue meds/tx as is   Problem # 3:  DEMENTIA (ICD-294.8) stable overall by hx and exam, ok to continue meds/tx as is , f/u any worsening symptoms  Problem # 4:  DEPRESSION (ICD-311)  stable overall by hx and exam, ok to continue meds/tx as is , declines other tx at this time  Discussed treatment options, including trial of antidpressant medication. Patient agrees to call if any worsening of symptoms or thoughts of doing harm arise. Verified that the patient has no suicidal ideation at this time.   Problem # 5:  HYPERTENSION (ICD-401.9)  His updated medication list for this problem includes:    Carvedilol 12.5 Mg Tabs (Carvedilol) .[name]... 1po two times a day  BP today: 120/72 Prior BP: 142/90 (11/27/2010)  Labs Reviewed: K+: 4.8 (05/05/2010) Creat: : 9.9 (05/05/2010)   Chol: 333 (11/10/2010)   HDL: 47.00 (11/10/2010)   TG: 293.0 (11/10/2010) stable overall by hx and exam, ok to continue meds/tx as is   Complete Medication List: 1)  Carvedilol 12.5 Mg Tabs (Carvedilol) .[name].. 1po two times a day 2)  Adult Aspirin Low Strength 81 Mg Tbdp (Aspirin) .... Take 1 by mouth qd 3)  Plavix 75 Mg Tabs (Clopidogrel bisulfate) .... Take 1 by mouth once daily 4)  Rena-vite Tabs (B complex-c-folic acid)  .... Take 1 tablet by mouth once a day 5)  Humulin 70/30 70-30 % Susp (Insulin isophane & regular) .... 25 units  before breakfast 6)  A1c 250.41  7)  Gabapentin 300 Mg Caps (Gabapentin) .... Take one at bedtine 8)  Epogen 3000 Unit/ml Soln (Epoetin alfa) .... At dialysis 9)  Oscal 500/200 D-3 500-200 Mg-unit Tabs (Calcium-vitamin d) .... 2 by mouth 3 times a week 10)  Ranitidine Hcl 300 Mg Caps (Ranitidine hcl) .[name].. 1 by mouth daily 11)  Sensipar 90 Mg Tabs (Cinacalcet hcl) .[name].. 1 by mouth once daily 12)  Allopurinol 100 Mg Tabs (Allopurinol) .... Take one daily 13)  Fish Oil 1000 Mg Caps (Omega-3 fatty acids) .... Take three daily 14)  Welchol 3.75 Gm Pack (Colesevelam hcl) .[name].. 1 pack by mouth in water once daily 15)  Zetia 10 Mg Tabs (Ezetimibe) .[name].. 1 by mouth once daily 16)  Atorvastatin Calcium 10 Mg Tabs (Atorvastatin calcium) .[name].. 1 by mouth every other day  Patient Instructions: 1)  Please take all new medications as prescribed - the Zetia or the generic lipitor at every other day 2)  Continue all previous medications as before this visit  3)  Please schedule a follow-up appointment in 3 months with: 4)  Lipid Panel prior to visit, ICD-9: 272,0 5)  Hepatic Panel prior to visit, ICD-9: v58.69 Prescriptions: ATORVASTATIN CALCIUM 10 MG TABS (ATORVASTATIN CALCIUM) 1 by mouth every other day  #45 x 3   Entered and Authorized by:   Biagio Borg MD   Signed by:   Biagio Borg MD on 12/08/2010   Method used:   Print then Give to Patient   RxID:   [id number] ZETIA 10 MG TABS (EZETIMIBE) 1 by mouth once daily  #90 x 3   Entered and Authorized by:   Biagio Borg MD   Signed by:   Biagio Borg MD on 12/08/2010   Method used:   Print then Give to Patient   RxID:   [id number]    Orders Added: 1)  Est. Patient Level IV [id number]

## 2010-12-13 ENCOUNTER — Encounter: Payer: Self-pay | Admitting: Internal Medicine

## 2010-12-13 ENCOUNTER — Ambulatory Visit (HOSPITAL_COMMUNITY): Payer: Medicare HMO | Attending: Internal Medicine

## 2010-12-13 DIAGNOSIS — I2589 Other forms of chronic ischemic heart disease: Secondary | ICD-10-CM | POA: Insufficient documentation

## 2010-12-13 DIAGNOSIS — J4489 Other specified chronic obstructive pulmonary disease: Secondary | ICD-10-CM | POA: Insufficient documentation

## 2010-12-13 DIAGNOSIS — I519 Heart disease, unspecified: Secondary | ICD-10-CM | POA: Insufficient documentation

## 2010-12-13 DIAGNOSIS — I2510 Atherosclerotic heart disease of native coronary artery without angina pectoris: Secondary | ICD-10-CM | POA: Insufficient documentation

## 2010-12-13 DIAGNOSIS — E785 Hyperlipidemia, unspecified: Secondary | ICD-10-CM | POA: Insufficient documentation

## 2010-12-13 DIAGNOSIS — E669 Obesity, unspecified: Secondary | ICD-10-CM | POA: Insufficient documentation

## 2010-12-13 DIAGNOSIS — I509 Heart failure, unspecified: Secondary | ICD-10-CM

## 2010-12-13 DIAGNOSIS — E119 Type 2 diabetes mellitus without complications: Secondary | ICD-10-CM | POA: Insufficient documentation

## 2010-12-13 DIAGNOSIS — Z992 Dependence on renal dialysis: Secondary | ICD-10-CM | POA: Insufficient documentation

## 2010-12-13 DIAGNOSIS — N186 End stage renal disease: Secondary | ICD-10-CM | POA: Insufficient documentation

## 2010-12-13 DIAGNOSIS — J449 Chronic obstructive pulmonary disease, unspecified: Secondary | ICD-10-CM | POA: Insufficient documentation

## 2010-12-15 ENCOUNTER — Other Ambulatory Visit: Payer: Self-pay | Admitting: Endocrinology

## 2010-12-15 LAB — CBC
HCT: 27.6 % — ABNORMAL LOW (ref 39.0–52.0)
MCHC: 31.9 g/dL (ref 30.0–36.0)
MCV: 89 fL (ref 78.0–100.0)
Platelets: 256 10*3/uL (ref 150–400)
RDW: 19.2 % — ABNORMAL HIGH (ref 11.5–15.5)

## 2010-12-15 LAB — GLUCOSE, CAPILLARY
Glucose-Capillary: 122 mg/dL — ABNORMAL HIGH (ref 70–99)
Glucose-Capillary: 132 mg/dL — ABNORMAL HIGH (ref 70–99)
Glucose-Capillary: 133 mg/dL — ABNORMAL HIGH (ref 70–99)
Glucose-Capillary: 146 mg/dL — ABNORMAL HIGH (ref 70–99)
Glucose-Capillary: 150 mg/dL — ABNORMAL HIGH (ref 70–99)
Glucose-Capillary: 191 mg/dL — ABNORMAL HIGH (ref 70–99)

## 2010-12-15 LAB — POCT I-STAT 4, (NA,K, GLUC, HGB,HCT)
Hemoglobin: 12.2 g/dL — ABNORMAL LOW (ref 13.0–17.0)
Hemoglobin: 12.6 g/dL — ABNORMAL LOW (ref 13.0–17.0)
Potassium: 5 mEq/L (ref 3.5–5.1)

## 2010-12-15 LAB — RENAL FUNCTION PANEL
CO2: 27 mEq/L (ref 19–32)
Calcium: 9.5 mg/dL (ref 8.4–10.5)
Creatinine, Ser: 12.32 mg/dL — ABNORMAL HIGH (ref 0.4–1.5)
GFR calc Af Amer: 5 mL/min — ABNORMAL LOW (ref >60)
Glucose, Bld: 65 mg/dL — ABNORMAL LOW (ref 70–99)
Phosphorus: 5.7 mg/dL — ABNORMAL HIGH (ref 2.3–4.6)
Sodium: 143 mEq/L (ref 135–145)

## 2010-12-15 LAB — POTASSIUM: Potassium: 4.7 mEq/L (ref 3.5–5.1)

## 2010-12-15 LAB — MRSA PCR SCREENING: MRSA by PCR: NEGATIVE

## 2010-12-16 ENCOUNTER — Telehealth: Payer: Self-pay | Admitting: Endocrinology

## 2010-12-17 LAB — GLUCOSE, CAPILLARY
Glucose-Capillary: 211 mg/dL — ABNORMAL HIGH (ref 70–99)
Glucose-Capillary: 136 mg/dL — ABNORMAL HIGH (ref 70–99)
Glucose-Capillary: 154 mg/dL — ABNORMAL HIGH (ref 70–99)

## 2010-12-17 LAB — COMPREHENSIVE METABOLIC PANEL
ALT: 15 U/L (ref 0–53)
Alkaline Phosphatase: 72 U/L (ref 39–117)
CO2: 27 mEq/L (ref 19–32)
Calcium: 10.4 mg/dL (ref 8.4–10.5)
Chloride: 104 mEq/L (ref 96–112)
GFR calc non Af Amer: 6 mL/min — ABNORMAL LOW (ref >60)
Glucose, Bld: 147 mg/dL — ABNORMAL HIGH (ref 70–99)
Sodium: 142 mEq/L (ref 135–145)
Total Bilirubin: 0.7 mg/dL (ref 0.3–1.2)

## 2010-12-17 LAB — BASIC METABOLIC PANEL
CO2: 27 mEq/L (ref 19–32)
Calcium: 9.8 mg/dL (ref 8.4–10.5)
Chloride: 104 mEq/L (ref 96–112)
Creatinine, Ser: 8.1 mg/dL — ABNORMAL HIGH (ref 0.4–1.5)
GFR calc Af Amer: 8 mL/min — ABNORMAL LOW (ref >60)
GFR calc Af Amer: 7 mL/min — ABNORMAL LOW (ref >60)
GFR calc non Af Amer: 7 mL/min — ABNORMAL LOW (ref >60)
Potassium: 5.1 mEq/L (ref 3.5–5.1)
Sodium: 138 mEq/L (ref 135–145)
Sodium: 142 mEq/L (ref 135–145)

## 2010-12-17 LAB — DIFFERENTIAL
Basophils Absolute: 0 10*3/uL (ref 0.0–0.1)
Basophils Absolute: 0 10*3/uL (ref 0.0–0.1)
Basophils Relative: 1 % (ref 0–1)
Eosinophils Absolute: 0.1 10*3/uL (ref 0.0–0.7)
Eosinophils Absolute: 0.2 10*3/uL (ref 0.0–0.7)
Eosinophils Relative: 2 % (ref 0–5)
Lymphocytes Relative: 22 % (ref 12–46)
Lymphs Abs: 1.5 10*3/uL (ref 0.7–4.0)
Lymphs Abs: 2.2 10*3/uL (ref 0.7–4.0)
Monocytes Absolute: 1 10*3/uL (ref 0.1–1.0)
Monocytes Relative: 11 % (ref 3–12)
Neutro Abs: 4.1 10*3/uL (ref 1.7–7.7)
Neutrophils Relative %: 58 % (ref 43–77)
Neutrophils Relative %: 47 % (ref 43–77)

## 2010-12-17 LAB — CBC
HCT: 39.4 % (ref 39.0–52.0)
Hemoglobin: 12.9 g/dL — ABNORMAL LOW (ref 13.0–17.0)
Hemoglobin: 12.2 g/dL — ABNORMAL LOW (ref 13.0–17.0)
MCH: 29.8 pg (ref 26.0–34.0)
MCHC: 33.5 g/dL (ref 30.0–36.0)
MCHC: 31 g/dL (ref 30.0–36.0)
MCV: 92.6 fL (ref 78.0–100.0)
RBC: 3.41 MIL/uL — ABNORMAL LOW (ref 4.22–5.81)
RBC: 4.33 MIL/uL (ref 4.22–5.81)
WBC: 6.8 10*3/uL (ref 4.0–10.5)

## 2010-12-17 LAB — PROTIME-INR
INR: 0.97 (ref 0.00–1.49)
Prothrombin Time: 12.8 seconds (ref 11.6–15.2)

## 2010-12-17 LAB — URINE MICROSCOPIC-ADD ON

## 2010-12-17 LAB — URINALYSIS, ROUTINE W REFLEX MICROSCOPIC
Leukocytes, UA: NEGATIVE
Nitrite: NEGATIVE
Specific Gravity, Urine: 1.02 (ref 1.005–1.030)
pH: 6.5 (ref 5.0–8.0)

## 2010-12-19 LAB — COMPREHENSIVE METABOLIC PANEL
ALT: 79 U/L — ABNORMAL HIGH (ref 0–53)
ALT: 93 U/L — ABNORMAL HIGH (ref 0–53)
AST: 49 U/L — ABNORMAL HIGH (ref 0–37)
AST: 73 U/L — ABNORMAL HIGH (ref 0–37)
Albumin: 2.4 g/dL — ABNORMAL LOW (ref 3.5–5.2)
Albumin: 2.4 g/dL — ABNORMAL LOW (ref 3.5–5.2)
Albumin: 2.4 g/dL — ABNORMAL LOW (ref 3.5–5.2)
Albumin: 2.4 g/dL — ABNORMAL LOW (ref 3.5–5.2)
Albumin: 2.5 g/dL — ABNORMAL LOW (ref 3.5–5.2)
Alkaline Phosphatase: 400 U/L — ABNORMAL HIGH (ref 39–117)
Alkaline Phosphatase: 417 U/L — ABNORMAL HIGH (ref 39–117)
BUN: 21 mg/dL (ref 6–23)
BUN: 25 mg/dL — ABNORMAL HIGH (ref 6–23)
BUN: 32 mg/dL — ABNORMAL HIGH (ref 6–23)
BUN: 35 mg/dL — ABNORMAL HIGH (ref 6–23)
CO2: 25 mEq/L (ref 19–32)
CO2: 27 mEq/L (ref 19–32)
CO2: 27 mEq/L (ref 19–32)
Calcium: 9.4 mg/dL (ref 8.4–10.5)
Calcium: 9.4 mg/dL (ref 8.4–10.5)
Calcium: 9.4 mg/dL (ref 8.4–10.5)
Calcium: 9.4 mg/dL (ref 8.4–10.5)
Calcium: 9.5 mg/dL (ref 8.4–10.5)
Chloride: 100 mEq/L (ref 96–112)
Chloride: 96 mEq/L (ref 96–112)
Chloride: 97 mEq/L (ref 96–112)
Creatinine, Ser: 5.45 mg/dL — ABNORMAL HIGH (ref 0.4–1.5)
Creatinine, Ser: 6.5 mg/dL — ABNORMAL HIGH (ref 0.4–1.5)
Creatinine, Ser: 6.53 mg/dL — ABNORMAL HIGH (ref 0.4–1.5)
Creatinine, Ser: 8.08 mg/dL — ABNORMAL HIGH (ref 0.4–1.5)
Creatinine, Ser: 8.3 mg/dL — ABNORMAL HIGH (ref 0.4–1.5)
Creatinine, Ser: 8.84 mg/dL — ABNORMAL HIGH (ref 0.4–1.5)
GFR calc Af Amer: 11 mL/min — ABNORMAL LOW (ref >60)
GFR calc Af Amer: 11 mL/min — ABNORMAL LOW (ref >60)
GFR calc Af Amer: 7 mL/min — ABNORMAL LOW (ref >60)
GFR calc non Af Amer: 6 mL/min — ABNORMAL LOW (ref >60)
GFR calc non Af Amer: 7 mL/min — ABNORMAL LOW (ref >60)
GFR calc non Af Amer: 9 mL/min — ABNORMAL LOW (ref >60)
Glucose, Bld: 171 mg/dL — ABNORMAL HIGH (ref 70–99)
Glucose, Bld: 181 mg/dL — ABNORMAL HIGH (ref 70–99)
Potassium: 3.5 mEq/L (ref 3.5–5.1)
Potassium: 3.9 mEq/L (ref 3.5–5.1)
Sodium: 134 mEq/L — ABNORMAL LOW (ref 135–145)
Sodium: 136 mEq/L (ref 135–145)
Total Bilirubin: 1.8 mg/dL — ABNORMAL HIGH (ref 0.3–1.2)
Total Bilirubin: 2.1 mg/dL — ABNORMAL HIGH (ref 0.3–1.2)
Total Bilirubin: 2.3 mg/dL — ABNORMAL HIGH (ref 0.3–1.2)
Total Bilirubin: 2.5 mg/dL — ABNORMAL HIGH (ref 0.3–1.2)
Total Protein: 6.3 g/dL (ref 6.0–8.3)
Total Protein: 6.3 g/dL (ref 6.0–8.3)
Total Protein: 6.5 g/dL (ref 6.0–8.3)

## 2010-12-19 LAB — CBC
HCT: 23.5 % — ABNORMAL LOW (ref 39.0–52.0)
HCT: 24.4 % — ABNORMAL LOW (ref 39.0–52.0)
HCT: 24.4 % — ABNORMAL LOW (ref 39.0–52.0)
HCT: 24.5 % — ABNORMAL LOW (ref 39.0–52.0)
HCT: 25.1 % — ABNORMAL LOW (ref 39.0–52.0)
Hemoglobin: 8 g/dL — ABNORMAL LOW (ref 13.0–17.0)
Hemoglobin: 8.1 g/dL — ABNORMAL LOW (ref 13.0–17.0)
Hemoglobin: 8.2 g/dL — ABNORMAL LOW (ref 13.0–17.0)
Hemoglobin: 8.5 g/dL — ABNORMAL LOW (ref 13.0–17.0)
Hemoglobin: 8.5 g/dL — ABNORMAL LOW (ref 13.0–17.0)
MCHC: 33.8 g/dL (ref 30.0–36.0)
MCHC: 34.6 g/dL (ref 30.0–36.0)
MCHC: 35.3 g/dL (ref 30.0–36.0)
MCV: 93.7 fL (ref 78.0–100.0)
MCV: 94.3 fL (ref 78.0–100.0)
MCV: 94.4 fL (ref 78.0–100.0)
MCV: 94.8 fL (ref 78.0–100.0)
MCV: 96.7 fL (ref 78.0–100.0)
MCV: 97.1 fL (ref 78.0–100.0)
Platelets: 381 10*3/uL (ref 150–400)
Platelets: 391 10*3/uL (ref 150–400)
Platelets: 404 10*3/uL — ABNORMAL HIGH (ref 150–400)
RBC: 2.42 MIL/uL — ABNORMAL LOW (ref 4.22–5.81)
RBC: 2.44 MIL/uL — ABNORMAL LOW (ref 4.22–5.81)
RBC: 2.44 MIL/uL — ABNORMAL LOW (ref 4.22–5.81)
RBC: 2.51 MIL/uL — ABNORMAL LOW (ref 4.22–5.81)
RBC: 2.56 MIL/uL — ABNORMAL LOW (ref 4.22–5.81)
RBC: 2.58 MIL/uL — ABNORMAL LOW (ref 4.22–5.81)
RDW: 18.2 % — ABNORMAL HIGH (ref 11.5–15.5)
RDW: 18.3 % — ABNORMAL HIGH (ref 11.5–15.5)
WBC: 10.7 10*3/uL — ABNORMAL HIGH (ref 4.0–10.5)
WBC: 11.1 10*3/uL — ABNORMAL HIGH (ref 4.0–10.5)
WBC: 11.2 10*3/uL — ABNORMAL HIGH (ref 4.0–10.5)
WBC: 11.2 10*3/uL — ABNORMAL HIGH (ref 4.0–10.5)
WBC: 13.5 10*3/uL — ABNORMAL HIGH (ref 4.0–10.5)
WBC: 9.8 10*3/uL (ref 4.0–10.5)

## 2010-12-19 LAB — HEPATIC FUNCTION PANEL
ALT: 123 U/L — ABNORMAL HIGH (ref 0–53)
Alkaline Phosphatase: 425 U/L — ABNORMAL HIGH (ref 39–117)
Indirect Bilirubin: 1.1 mg/dL — ABNORMAL HIGH (ref 0.3–0.9)
Total Protein: 6.2 g/dL (ref 6.0–8.3)

## 2010-12-19 LAB — GLUCOSE, CAPILLARY
Glucose-Capillary: 136 mg/dL — ABNORMAL HIGH (ref 70–99)
Glucose-Capillary: 139 mg/dL — ABNORMAL HIGH (ref 70–99)
Glucose-Capillary: 154 mg/dL — ABNORMAL HIGH (ref 70–99)
Glucose-Capillary: 155 mg/dL — ABNORMAL HIGH (ref 70–99)
Glucose-Capillary: 166 mg/dL — ABNORMAL HIGH (ref 70–99)
Glucose-Capillary: 174 mg/dL — ABNORMAL HIGH (ref 70–99)
Glucose-Capillary: 175 mg/dL — ABNORMAL HIGH (ref 70–99)
Glucose-Capillary: 184 mg/dL — ABNORMAL HIGH (ref 70–99)
Glucose-Capillary: 205 mg/dL — ABNORMAL HIGH (ref 70–99)
Glucose-Capillary: 206 mg/dL — ABNORMAL HIGH (ref 70–99)
Glucose-Capillary: 207 mg/dL — ABNORMAL HIGH (ref 70–99)
Glucose-Capillary: 209 mg/dL — ABNORMAL HIGH (ref 70–99)
Glucose-Capillary: 217 mg/dL — ABNORMAL HIGH (ref 70–99)
Glucose-Capillary: 238 mg/dL — ABNORMAL HIGH (ref 70–99)
Glucose-Capillary: 257 mg/dL — ABNORMAL HIGH (ref 70–99)
Glucose-Capillary: 263 mg/dL — ABNORMAL HIGH (ref 70–99)

## 2010-12-19 LAB — RENAL FUNCTION PANEL
BUN: 60 mg/dL — ABNORMAL HIGH (ref 6–23)
CO2: 27 mEq/L (ref 19–32)
Glucose, Bld: 200 mg/dL — ABNORMAL HIGH (ref 70–99)
Phosphorus: 4.6 mg/dL (ref 2.3–4.6)
Potassium: 4.2 mEq/L (ref 3.5–5.1)

## 2010-12-19 LAB — BASIC METABOLIC PANEL
Chloride: 104 mEq/L (ref 96–112)
Creatinine, Ser: 9.82 mg/dL — ABNORMAL HIGH (ref 0.4–1.5)
GFR calc Af Amer: 7 mL/min — ABNORMAL LOW (ref >60)
Potassium: 3.9 mEq/L (ref 3.5–5.1)
Sodium: 142 mEq/L (ref 135–145)

## 2010-12-19 LAB — HEMOCCULT GUIAC POC 1CARD (OFFICE): Fecal Occult Bld: POSITIVE

## 2010-12-19 LAB — HEPATITIS B SURFACE ANTIGEN: Hepatitis B Surface Ag: NEGATIVE

## 2010-12-19 NOTE — Progress Notes (Signed)
----   Converted from flag ---- ---- 12/15/2010 2:00 PM, Nonah Mattes wrote: Dr Florina Ou  pt was  scheduled wed March 21,2012@9 :00 Wl-i 131 rx per 01 pt can not have this done  Due to Kidney Diaysis per Jeneen Rinks states you will need to call Dr Jeneen Rinks @ [facility] @[phone] to disuess pt . He is at [place] today  but  he will be out of the office monday and will return to [facility] on tue ------------------------------  i called dr [place] Daniel.  to do the i-131 rx on a dialysis pt is an enormous logistical undertaking.  instead, please start tpazole 5 mg biw.  if fever on this med, stop it and call Ilda Foil.  ret may as scheduled.      New/Updated Medications: METHIMAZOLE 5 MG TABS (METHIMAZOLE) 1 tab 2x a week Prescriptions: METHIMAZOLE 5 MG TABS (METHIMAZOLE) 1 tab 2x a week  #10 x 5   Entered and Authorized by:   [place] MD   Signed by:   Donavan Foil MD on 12/16/2010   Method used:   Electronically to        12/29/2010 Dr.* (retail)       [address]       [place], [place]  [place]       Ph: [phone]       Fax: [phone]   RxID:   [id number]

## 2010-12-20 ENCOUNTER — Ambulatory Visit (HOSPITAL_COMMUNITY): Payer: Medicare HMO

## 2010-12-20 LAB — COMPREHENSIVE METABOLIC PANEL
ALT: 58 U/L — ABNORMAL HIGH (ref 0–53)
ALT: 60 U/L — ABNORMAL HIGH (ref 0–53)
ALT: 72 U/L — ABNORMAL HIGH (ref 0–53)
ALT: 74 U/L — ABNORMAL HIGH (ref 0–53)
ALT: 75 U/L — ABNORMAL HIGH (ref 0–53)
ALT: 85 U/L — ABNORMAL HIGH (ref 0–53)
ALT: 95 U/L — ABNORMAL HIGH (ref 0–53)
AST: 41 U/L — ABNORMAL HIGH (ref 0–37)
AST: 42 U/L — ABNORMAL HIGH (ref 0–37)
AST: 47 U/L — ABNORMAL HIGH (ref 0–37)
AST: 50 U/L — ABNORMAL HIGH (ref 0–37)
AST: 70 U/L — ABNORMAL HIGH (ref 0–37)
Albumin: 2.1 g/dL — ABNORMAL LOW (ref 3.5–5.2)
Albumin: 2.2 g/dL — ABNORMAL LOW (ref 3.5–5.2)
Albumin: 2.4 g/dL — ABNORMAL LOW (ref 3.5–5.2)
Alkaline Phosphatase: 370 U/L — ABNORMAL HIGH (ref 39–117)
Alkaline Phosphatase: 407 U/L — ABNORMAL HIGH (ref 39–117)
Alkaline Phosphatase: 420 U/L — ABNORMAL HIGH (ref 39–117)
Alkaline Phosphatase: 445 U/L — ABNORMAL HIGH (ref 39–117)
Alkaline Phosphatase: 457 U/L — ABNORMAL HIGH (ref 39–117)
Alkaline Phosphatase: 470 U/L — ABNORMAL HIGH (ref 39–117)
BUN: 15 mg/dL (ref 6–23)
BUN: 22 mg/dL (ref 6–23)
BUN: 29 mg/dL — ABNORMAL HIGH (ref 6–23)
BUN: 34 mg/dL — ABNORMAL HIGH (ref 6–23)
BUN: 39 mg/dL — ABNORMAL HIGH (ref 6–23)
BUN: 51 mg/dL — ABNORMAL HIGH (ref 6–23)
CO2: 26 mEq/L (ref 19–32)
CO2: 26 mEq/L (ref 19–32)
CO2: 28 mEq/L (ref 19–32)
CO2: 29 mEq/L (ref 19–32)
CO2: 30 mEq/L (ref 19–32)
CO2: 31 mEq/L (ref 19–32)
CO2: 31 mEq/L (ref 19–32)
Calcium: 10.1 mg/dL (ref 8.4–10.5)
Calcium: 9.1 mg/dL (ref 8.4–10.5)
Calcium: 9.4 mg/dL (ref 8.4–10.5)
Calcium: 9.6 mg/dL (ref 8.4–10.5)
Calcium: 9.6 mg/dL (ref 8.4–10.5)
Chloride: 101 mEq/L (ref 96–112)
Chloride: 93 mEq/L — ABNORMAL LOW (ref 96–112)
Chloride: 96 mEq/L (ref 96–112)
Creatinine, Ser: 10.44 mg/dL — ABNORMAL HIGH (ref 0.4–1.5)
Creatinine, Ser: 4.71 mg/dL — ABNORMAL HIGH (ref 0.4–1.5)
Creatinine, Ser: 9.58 mg/dL — ABNORMAL HIGH (ref 0.4–1.5)
GFR calc Af Amer: 10 mL/min — ABNORMAL LOW (ref >60)
GFR calc Af Amer: 11 mL/min — ABNORMAL LOW (ref >60)
GFR calc Af Amer: 15 mL/min — ABNORMAL LOW (ref >60)
GFR calc Af Amer: 7 mL/min — ABNORMAL LOW (ref >60)
GFR calc Af Amer: 9 mL/min — ABNORMAL LOW (ref >60)
GFR calc non Af Amer: 11 mL/min — ABNORMAL LOW (ref >60)
GFR calc non Af Amer: 13 mL/min — ABNORMAL LOW (ref >60)
GFR calc non Af Amer: 5 mL/min — ABNORMAL LOW (ref >60)
GFR calc non Af Amer: 6 mL/min — ABNORMAL LOW (ref >60)
GFR calc non Af Amer: 6 mL/min — ABNORMAL LOW (ref >60)
GFR calc non Af Amer: 7 mL/min — ABNORMAL LOW (ref >60)
GFR calc non Af Amer: 8 mL/min — ABNORMAL LOW (ref >60)
Glucose, Bld: 104 mg/dL — ABNORMAL HIGH (ref 70–99)
Glucose, Bld: 129 mg/dL — ABNORMAL HIGH (ref 70–99)
Glucose, Bld: 135 mg/dL — ABNORMAL HIGH (ref 70–99)
Glucose, Bld: 137 mg/dL — ABNORMAL HIGH (ref 70–99)
Glucose, Bld: 139 mg/dL — ABNORMAL HIGH (ref 70–99)
Glucose, Bld: 149 mg/dL — ABNORMAL HIGH (ref 70–99)
Glucose, Bld: 176 mg/dL — ABNORMAL HIGH (ref 70–99)
Glucose, Bld: 189 mg/dL — ABNORMAL HIGH (ref 70–99)
Glucose, Bld: 199 mg/dL — ABNORMAL HIGH (ref 70–99)
Potassium: 3.7 mEq/L (ref 3.5–5.1)
Potassium: 3.7 mEq/L (ref 3.5–5.1)
Potassium: 3.7 mEq/L (ref 3.5–5.1)
Potassium: 4.1 mEq/L (ref 3.5–5.1)
Potassium: 4.2 mEq/L (ref 3.5–5.1)
Potassium: 4.2 mEq/L (ref 3.5–5.1)
Sodium: 134 mEq/L — ABNORMAL LOW (ref 135–145)
Sodium: 135 mEq/L (ref 135–145)
Sodium: 136 mEq/L (ref 135–145)
Sodium: 136 mEq/L (ref 135–145)
Sodium: 137 mEq/L (ref 135–145)
Sodium: 137 mEq/L (ref 135–145)
Total Bilirubin: 1.7 mg/dL — ABNORMAL HIGH (ref 0.3–1.2)
Total Bilirubin: 2.3 mg/dL — ABNORMAL HIGH (ref 0.3–1.2)
Total Bilirubin: 2.9 mg/dL — ABNORMAL HIGH (ref 0.3–1.2)
Total Bilirubin: 3 mg/dL — ABNORMAL HIGH (ref 0.3–1.2)
Total Bilirubin: 3.4 mg/dL — ABNORMAL HIGH (ref 0.3–1.2)
Total Protein: 6.4 g/dL (ref 6.0–8.3)
Total Protein: 6.6 g/dL (ref 6.0–8.3)
Total Protein: 6.6 g/dL (ref 6.0–8.3)
Total Protein: 6.6 g/dL (ref 6.0–8.3)
Total Protein: 6.8 g/dL (ref 6.0–8.3)
Total Protein: 6.8 g/dL (ref 6.0–8.3)
Total Protein: 6.9 g/dL (ref 6.0–8.3)
Total Protein: 6.9 g/dL (ref 6.0–8.3)
Total Protein: 7 g/dL (ref 6.0–8.3)

## 2010-12-20 LAB — CBC
HCT: 24.9 % — ABNORMAL LOW (ref 39.0–52.0)
HCT: 28.3 % — ABNORMAL LOW (ref 39.0–52.0)
HCT: 29.3 % — ABNORMAL LOW (ref 39.0–52.0)
HCT: 29.7 % — ABNORMAL LOW (ref 39.0–52.0)
HCT: 31.8 % — ABNORMAL LOW (ref 39.0–52.0)
HCT: 32 % — ABNORMAL LOW (ref 39.0–52.0)
HCT: 32.5 % — ABNORMAL LOW (ref 39.0–52.0)
HCT: 33.8 % — ABNORMAL LOW (ref 39.0–52.0)
Hemoglobin: 10 g/dL — ABNORMAL LOW (ref 13.0–17.0)
Hemoglobin: 10.2 g/dL — ABNORMAL LOW (ref 13.0–17.0)
Hemoglobin: 10.5 g/dL — ABNORMAL LOW (ref 13.0–17.0)
Hemoglobin: 10.8 g/dL — ABNORMAL LOW (ref 13.0–17.0)
Hemoglobin: 11.2 g/dL — ABNORMAL LOW (ref 13.0–17.0)
Hemoglobin: 8.6 g/dL — ABNORMAL LOW (ref 13.0–17.0)
MCHC: 33 g/dL (ref 30.0–36.0)
MCHC: 33.1 g/dL (ref 30.0–36.0)
MCHC: 33.4 g/dL (ref 30.0–36.0)
MCHC: 33.5 g/dL (ref 30.0–36.0)
MCHC: 33.6 g/dL (ref 30.0–36.0)
MCHC: 34.4 g/dL (ref 30.0–36.0)
MCV: 92.6 fL (ref 78.0–100.0)
MCV: 92.7 fL (ref 78.0–100.0)
MCV: 92.7 fL (ref 78.0–100.0)
MCV: 93.1 fL (ref 78.0–100.0)
MCV: 93.1 fL (ref 78.0–100.0)
Platelets: 334 10*3/uL (ref 150–400)
Platelets: 363 10*3/uL (ref 150–400)
Platelets: 374 10*3/uL (ref 150–400)
Platelets: 396 10*3/uL (ref 150–400)
RBC: 2.69 MIL/uL — ABNORMAL LOW (ref 4.22–5.81)
RBC: 3.16 MIL/uL — ABNORMAL LOW (ref 4.22–5.81)
RBC: 3.18 MIL/uL — ABNORMAL LOW (ref 4.22–5.81)
RBC: 3.38 MIL/uL — ABNORMAL LOW (ref 4.22–5.81)
RBC: 3.42 MIL/uL — ABNORMAL LOW (ref 4.22–5.81)
RBC: 3.43 MIL/uL — ABNORMAL LOW (ref 4.22–5.81)
RBC: 3.5 MIL/uL — ABNORMAL LOW (ref 4.22–5.81)
RBC: 3.63 MIL/uL — ABNORMAL LOW (ref 4.22–5.81)
RDW: 17.9 % — ABNORMAL HIGH (ref 11.5–15.5)
RDW: 18.2 % — ABNORMAL HIGH (ref 11.5–15.5)
RDW: 18.3 % — ABNORMAL HIGH (ref 11.5–15.5)
RDW: 18.4 % — ABNORMAL HIGH (ref 11.5–15.5)
RDW: 18.5 % — ABNORMAL HIGH (ref 11.5–15.5)
RDW: 18.6 % — ABNORMAL HIGH (ref 11.5–15.5)
RDW: 18.6 % — ABNORMAL HIGH (ref 11.5–15.5)
RDW: 18.9 % — ABNORMAL HIGH (ref 11.5–15.5)
RDW: 19 % — ABNORMAL HIGH (ref 11.5–15.5)
WBC: 10.4 10*3/uL (ref 4.0–10.5)
WBC: 9.2 10*3/uL (ref 4.0–10.5)
WBC: 9.4 10*3/uL (ref 4.0–10.5)
WBC: 9.6 10*3/uL (ref 4.0–10.5)

## 2010-12-20 LAB — GLUCOSE, CAPILLARY
Glucose-Capillary: 107 mg/dL — ABNORMAL HIGH (ref 70–99)
Glucose-Capillary: 109 mg/dL — ABNORMAL HIGH (ref 70–99)
Glucose-Capillary: 113 mg/dL — ABNORMAL HIGH (ref 70–99)
Glucose-Capillary: 139 mg/dL — ABNORMAL HIGH (ref 70–99)
Glucose-Capillary: 140 mg/dL — ABNORMAL HIGH (ref 70–99)
Glucose-Capillary: 141 mg/dL — ABNORMAL HIGH (ref 70–99)
Glucose-Capillary: 141 mg/dL — ABNORMAL HIGH (ref 70–99)
Glucose-Capillary: 142 mg/dL — ABNORMAL HIGH (ref 70–99)
Glucose-Capillary: 148 mg/dL — ABNORMAL HIGH (ref 70–99)
Glucose-Capillary: 149 mg/dL — ABNORMAL HIGH (ref 70–99)
Glucose-Capillary: 153 mg/dL — ABNORMAL HIGH (ref 70–99)
Glucose-Capillary: 155 mg/dL — ABNORMAL HIGH (ref 70–99)
Glucose-Capillary: 156 mg/dL — ABNORMAL HIGH (ref 70–99)
Glucose-Capillary: 157 mg/dL — ABNORMAL HIGH (ref 70–99)
Glucose-Capillary: 164 mg/dL — ABNORMAL HIGH (ref 70–99)
Glucose-Capillary: 170 mg/dL — ABNORMAL HIGH (ref 70–99)
Glucose-Capillary: 177 mg/dL — ABNORMAL HIGH (ref 70–99)
Glucose-Capillary: 178 mg/dL — ABNORMAL HIGH (ref 70–99)
Glucose-Capillary: 178 mg/dL — ABNORMAL HIGH (ref 70–99)
Glucose-Capillary: 180 mg/dL — ABNORMAL HIGH (ref 70–99)
Glucose-Capillary: 183 mg/dL — ABNORMAL HIGH (ref 70–99)
Glucose-Capillary: 184 mg/dL — ABNORMAL HIGH (ref 70–99)
Glucose-Capillary: 186 mg/dL — ABNORMAL HIGH (ref 70–99)
Glucose-Capillary: 188 mg/dL — ABNORMAL HIGH (ref 70–99)
Glucose-Capillary: 193 mg/dL — ABNORMAL HIGH (ref 70–99)
Glucose-Capillary: 194 mg/dL — ABNORMAL HIGH (ref 70–99)
Glucose-Capillary: 199 mg/dL — ABNORMAL HIGH (ref 70–99)
Glucose-Capillary: 205 mg/dL — ABNORMAL HIGH (ref 70–99)
Glucose-Capillary: 218 mg/dL — ABNORMAL HIGH (ref 70–99)
Glucose-Capillary: 218 mg/dL — ABNORMAL HIGH (ref 70–99)

## 2010-12-20 LAB — URINALYSIS, MICROSCOPIC ONLY
Glucose, UA: 250 mg/dL — AB
Ketones, ur: NEGATIVE mg/dL
Protein, ur: >300 mg/dL — AB
Urobilinogen, UA: 0.2 mg/dL (ref 0.0–1.0)

## 2010-12-20 LAB — RENAL FUNCTION PANEL
Albumin: 2.2 g/dL — ABNORMAL LOW (ref 3.5–5.2)
BUN: 27 mg/dL — ABNORMAL HIGH (ref 6–23)
Chloride: 97 mEq/L (ref 96–112)
GFR calc non Af Amer: 11 mL/min — ABNORMAL LOW (ref >60)
Glucose, Bld: 129 mg/dL — ABNORMAL HIGH (ref 70–99)
Potassium: 3.2 mEq/L — ABNORMAL LOW (ref 3.5–5.1)
Sodium: 137 mEq/L (ref 135–145)

## 2010-12-20 LAB — PROTIME-INR
INR: 0.93 (ref 0.00–1.49)
Prothrombin Time: 12.4 seconds (ref 11.6–15.2)

## 2010-12-20 LAB — HEPATIC FUNCTION PANEL
AST: 52 U/L — ABNORMAL HIGH (ref 0–37)
Bilirubin, Direct: 1.5 mg/dL — ABNORMAL HIGH (ref 0.0–0.3)
Bilirubin, Direct: 1.6 mg/dL — ABNORMAL HIGH (ref 0.0–0.3)
Indirect Bilirubin: 1.4 mg/dL — ABNORMAL HIGH (ref 0.3–0.9)
Total Protein: 7.2 g/dL (ref 6.0–8.3)

## 2010-12-20 LAB — CROSSMATCH
ABO/RH(D): O POS
Antibody Screen: NEGATIVE

## 2010-12-20 LAB — BASIC METABOLIC PANEL
Chloride: 96 mEq/L (ref 96–112)
GFR calc Af Amer: 13 mL/min — ABNORMAL LOW (ref >60)
Potassium: 3.8 mEq/L (ref 3.5–5.1)

## 2010-12-20 LAB — POCT I-STAT 3, VENOUS BLOOD GAS (G3P V)
Acid-Base Excess: 3 mmol/L — ABNORMAL HIGH (ref 0.0–2.0)
Acid-Base Excess: 4 mmol/L — ABNORMAL HIGH (ref 0.0–2.0)
Acid-Base Excess: 4 mmol/L — ABNORMAL HIGH (ref 0.0–2.0)
Bicarbonate: 29.3 mEq/L — ABNORMAL HIGH (ref 20.0–24.0)
O2 Saturation: 58 %
O2 Saturation: 58 %
TCO2: 30 mmol/L (ref 0–100)
TCO2: 31 mmol/L (ref 0–100)
pCO2, Ven: 45 mmHg (ref 45.0–50.0)
pH, Ven: 7.408 — ABNORMAL HIGH (ref 7.250–7.300)
pO2, Ven: 30 mmHg (ref 30.0–45.0)

## 2010-12-20 LAB — DIFFERENTIAL
Eosinophils Absolute: 0.3 10*3/uL (ref 0.0–0.7)
Eosinophils Relative: 3 % (ref 0–5)
Lymphs Abs: 1.5 10*3/uL (ref 0.7–4.0)
Monocytes Relative: 12 % (ref 3–12)
Neutrophils Relative %: 70 % (ref 43–77)

## 2010-12-20 LAB — PHOSPHORUS
Phosphorus: 5.3 mg/dL — ABNORMAL HIGH (ref 2.3–4.6)
Phosphorus: 5.8 mg/dL — ABNORMAL HIGH (ref 2.3–4.6)
Phosphorus: 7.3 mg/dL — ABNORMAL HIGH (ref 2.3–4.6)
Phosphorus: 9.5 mg/dL (ref 2.3–4.6)

## 2010-12-20 LAB — FERRITIN: Ferritin: 1638 ng/mL — ABNORMAL HIGH (ref 22–322)

## 2010-12-20 LAB — POCT I-STAT GLUCOSE
Glucose, Bld: 158 mg/dL — ABNORMAL HIGH (ref 70–99)
Operator id: 141321

## 2010-12-24 LAB — CBC
HCT: 27.1 % — ABNORMAL LOW (ref 39.0–52.0)
HCT: 27.5 % — ABNORMAL LOW (ref 39.0–52.0)
HCT: 29.1 % — ABNORMAL LOW (ref 39.0–52.0)
Hemoglobin: 9.3 g/dL — ABNORMAL LOW (ref 13.0–17.0)
MCHC: 33.6 g/dL (ref 30.0–36.0)
MCHC: 33.9 g/dL (ref 30.0–36.0)
MCHC: 34.5 g/dL (ref 30.0–36.0)
MCV: 91.3 fL (ref 78.0–100.0)
MCV: 92 fL (ref 78.0–100.0)
MCV: 92.4 fL (ref 78.0–100.0)
MCV: 93.4 fL (ref 78.0–100.0)
Platelets: 300 10*3/uL (ref 150–400)
Platelets: 304 10*3/uL (ref 150–400)
Platelets: 324 10*3/uL (ref 150–400)
RBC: 2.95 MIL/uL — ABNORMAL LOW (ref 4.22–5.81)
RBC: 2.95 MIL/uL — ABNORMAL LOW (ref 4.22–5.81)
RDW: 18.4 % — ABNORMAL HIGH (ref 11.5–15.5)
RDW: 18.6 % — ABNORMAL HIGH (ref 11.5–15.5)
WBC: 11.6 10*3/uL — ABNORMAL HIGH (ref 4.0–10.5)
WBC: 14.1 10*3/uL — ABNORMAL HIGH (ref 4.0–10.5)

## 2010-12-24 LAB — COMPREHENSIVE METABOLIC PANEL
AST: 57 U/L — ABNORMAL HIGH (ref 0–37)
AST: 77 U/L — ABNORMAL HIGH (ref 0–37)
Albumin: 2 g/dL — ABNORMAL LOW (ref 3.5–5.2)
Albumin: 2.2 g/dL — ABNORMAL LOW (ref 3.5–5.2)
BUN: 17 mg/dL (ref 6–23)
BUN: 34 mg/dL — ABNORMAL HIGH (ref 6–23)
CO2: 25 mEq/L (ref 19–32)
Calcium: 8.6 mg/dL (ref 8.4–10.5)
Calcium: 9 mg/dL (ref 8.4–10.5)
Chloride: 102 mEq/L (ref 96–112)
Chloride: 102 mEq/L (ref 96–112)
Chloride: 104 mEq/L (ref 96–112)
Creatinine, Ser: 3.76 mg/dL — ABNORMAL HIGH (ref 0.4–1.5)
Creatinine, Ser: 4.44 mg/dL — ABNORMAL HIGH (ref 0.4–1.5)
Creatinine, Ser: 5.57 mg/dL — ABNORMAL HIGH (ref 0.4–1.5)
GFR calc Af Amer: 13 mL/min — ABNORMAL LOW (ref >60)
GFR calc Af Amer: 17 mL/min — ABNORMAL LOW (ref >60)
GFR calc non Af Amer: 11 mL/min — ABNORMAL LOW (ref >60)
Total Bilirubin: 4.2 mg/dL — ABNORMAL HIGH (ref 0.3–1.2)
Total Bilirubin: 4.5 mg/dL — ABNORMAL HIGH (ref 0.3–1.2)
Total Protein: 5.9 g/dL — ABNORMAL LOW (ref 6.0–8.3)
Total Protein: 6.6 g/dL (ref 6.0–8.3)

## 2010-12-24 LAB — CULTURE, BLOOD (ROUTINE X 2)
Culture: NO GROWTH
Culture: NO GROWTH

## 2010-12-24 LAB — URINE CULTURE
Colony Count: NO GROWTH
Culture: NO GROWTH

## 2010-12-24 LAB — GLUCOSE, CAPILLARY
Glucose-Capillary: 107 mg/dL — ABNORMAL HIGH (ref 70–99)
Glucose-Capillary: 170 mg/dL — ABNORMAL HIGH (ref 70–99)
Glucose-Capillary: 97 mg/dL (ref 70–99)

## 2010-12-24 LAB — URINALYSIS, ROUTINE W REFLEX MICROSCOPIC
Ketones, ur: 15 mg/dL — AB
Nitrite: NEGATIVE
Protein, ur: >300 mg/dL — AB
Urobilinogen, UA: 1 mg/dL (ref 0.0–1.0)

## 2010-12-24 LAB — RENAL FUNCTION PANEL
Albumin: 1.9 g/dL — ABNORMAL LOW (ref 3.5–5.2)
BUN: 34 mg/dL — ABNORMAL HIGH (ref 6–23)
CO2: 24 mEq/L (ref 19–32)
Chloride: 101 mEq/L (ref 96–112)
Creatinine, Ser: 5.51 mg/dL — ABNORMAL HIGH (ref 0.4–1.5)
Glucose, Bld: 101 mg/dL — ABNORMAL HIGH (ref 70–99)
Potassium: 3.7 mEq/L (ref 3.5–5.1)

## 2010-12-24 LAB — DIFFERENTIAL
Basophils Absolute: 0 10*3/uL (ref 0.0–0.1)
Basophils Absolute: 0 10*3/uL (ref 0.0–0.1)
Eosinophils Relative: 1 % (ref 0–5)
Lymphocytes Relative: 15 % (ref 12–46)
Lymphocytes Relative: 7 % — ABNORMAL LOW (ref 12–46)
Lymphs Abs: 1.6 10*3/uL (ref 0.7–4.0)
Monocytes Absolute: 0.9 10*3/uL (ref 0.1–1.0)
Neutro Abs: 7.3 10*3/uL (ref 1.7–7.7)
Neutro Abs: 8.2 10*3/uL — ABNORMAL HIGH (ref 1.7–7.7)
Neutrophils Relative %: 73 % (ref 43–77)

## 2010-12-24 LAB — HEPATITIS PANEL, ACUTE
HCV Ab: NEGATIVE
Hep A IgM: NEGATIVE

## 2010-12-24 LAB — LIPID PANEL
HDL: 20 mg/dL — ABNORMAL LOW (ref >39)
LDL Cholesterol: 343 mg/dL — ABNORMAL HIGH (ref 0–99)
Triglycerides: 388 mg/dL — ABNORMAL HIGH (ref <150)
VLDL: 78 mg/dL — ABNORMAL HIGH (ref 0–40)

## 2010-12-24 LAB — AMYLASE: Amylase: 37 U/L (ref 0–105)

## 2011-01-07 LAB — GLUCOSE, CAPILLARY: Glucose-Capillary: 119 mg/dL — ABNORMAL HIGH (ref 70–99)

## 2011-01-09 LAB — POCT I-STAT 3, VENOUS BLOOD GAS (G3P V)
Acid-Base Excess: 3 mmol/L — ABNORMAL HIGH (ref 0.0–2.0)
Acid-Base Excess: 3 mmol/L — ABNORMAL HIGH (ref 0.0–2.0)
Bicarbonate: 28.1 mEq/L — ABNORMAL HIGH (ref 20.0–24.0)
O2 Saturation: 65 %
TCO2: 28 mmol/L (ref 0–100)
TCO2: 29 mmol/L (ref 0–100)
pCO2, Ven: 40.4 mmHg — ABNORMAL LOW (ref 45.0–50.0)
pO2, Ven: 33 mmHg (ref 30.0–45.0)

## 2011-01-09 LAB — POCT I-STAT 3, ART BLOOD GAS (G3+)
Bicarbonate: 28 mEq/L — ABNORMAL HIGH (ref 20.0–24.0)
O2 Saturation: 88 %
pCO2 arterial: 40.4 mmHg (ref 35.0–45.0)
pO2, Arterial: 52 mmHg — ABNORMAL LOW (ref 80.0–100.0)

## 2011-01-11 IMAGING — RF DG ERCP WO/W SPHINCTEROTOMY
12 of 14 series · 14 of 18 positions shown · non-contrast
Comparison: 01/19/2010.

CLINICAL DATA: Biliary stent removal and stone extraction.

ERCP
TECHNIQUE: Multiple spot images obtained with the fluoroscopic
device and submitted for interpretation post-procedure.  ERCP was
performed by Dr. Hupi.

[Series 1: cont. · 1 of 1 slices shown (1 of 12)]
[im 1/1]
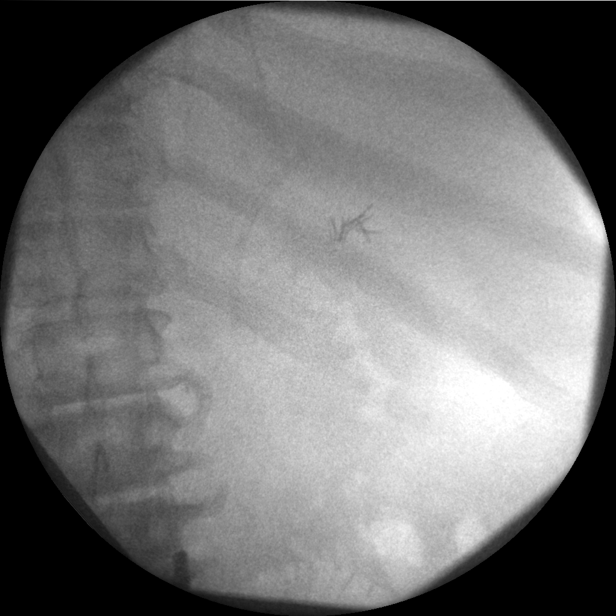

[Series 3: cont. · 1 of 1 slices shown (2 of 12)]
[im 1/1]
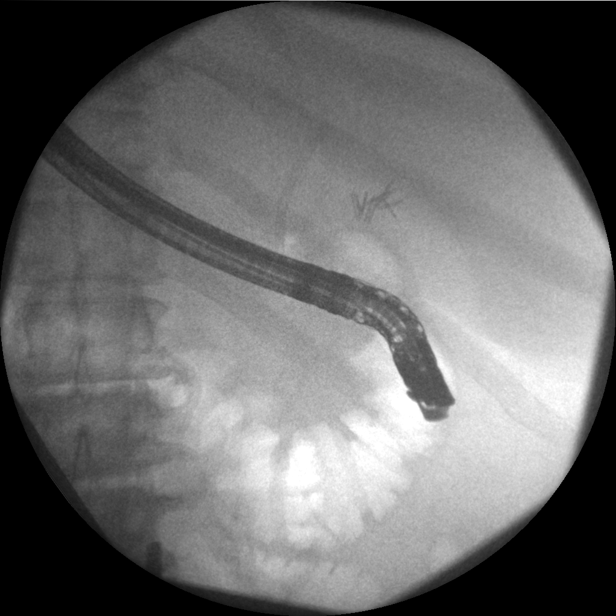

[Series 5: cont. · 1 of 2 slices shown (3 of 12)]
[im 2/2]
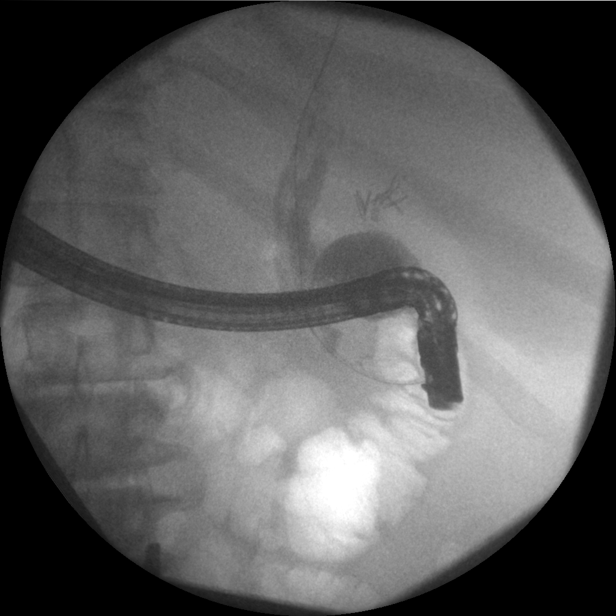

[Series 8: cont. · 1 of 1 slices shown (4 of 12)]
[im 1/1]
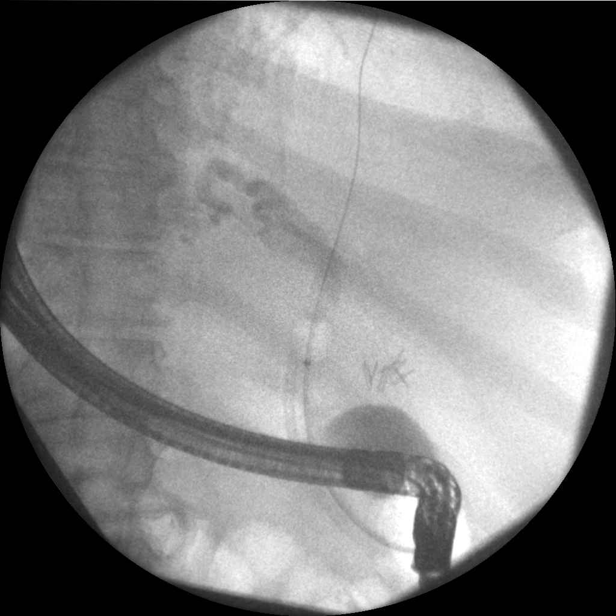

[Series 9: cont. · 1 of 1 slices shown (5 of 12)]
[im 1/1]
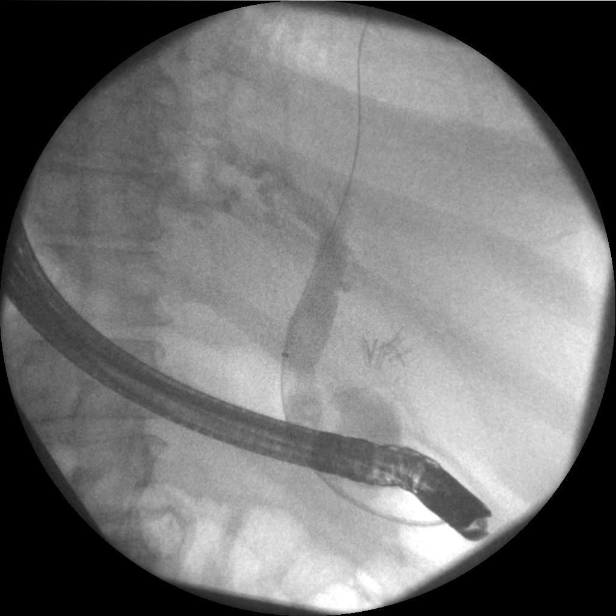

[Series 11: cont. · 1 of 1 slices shown (6 of 12)]
[im 1/1]
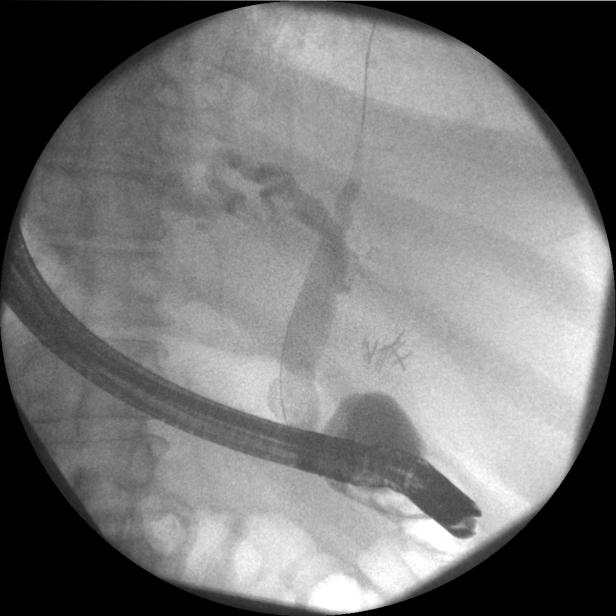

[Series 13: cont. · 1 of 1 slices shown (7 of 12)]
[im 1/1]
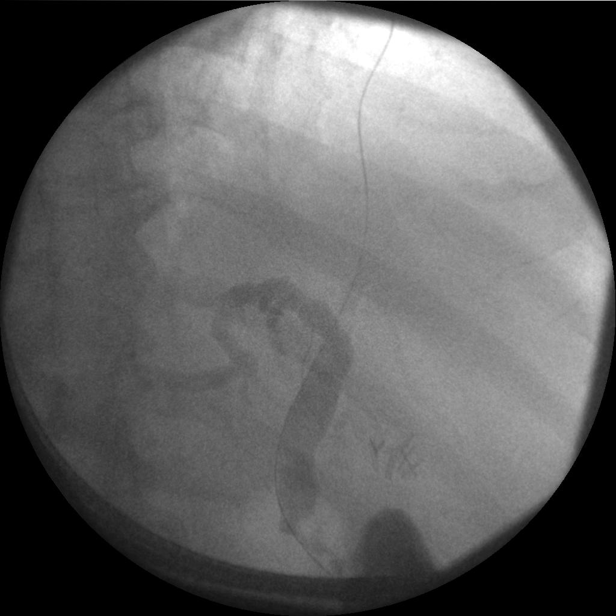

[Series 16: cont. · 3 of 4 slices shown (8 of 12)]
[im 1/4]
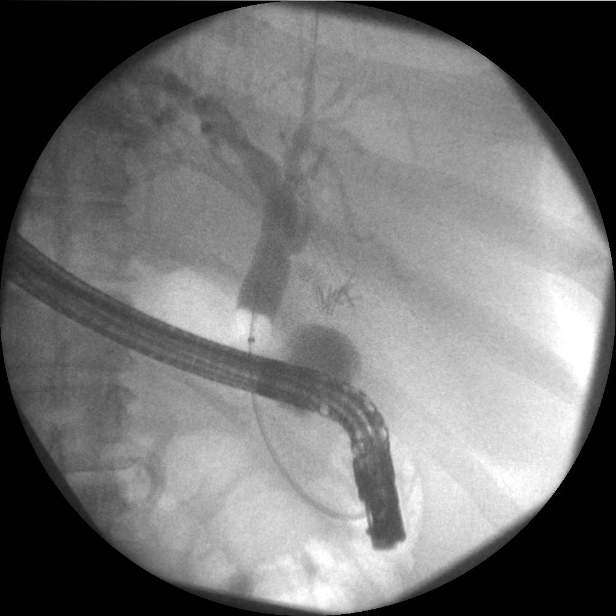
[im 2/4]
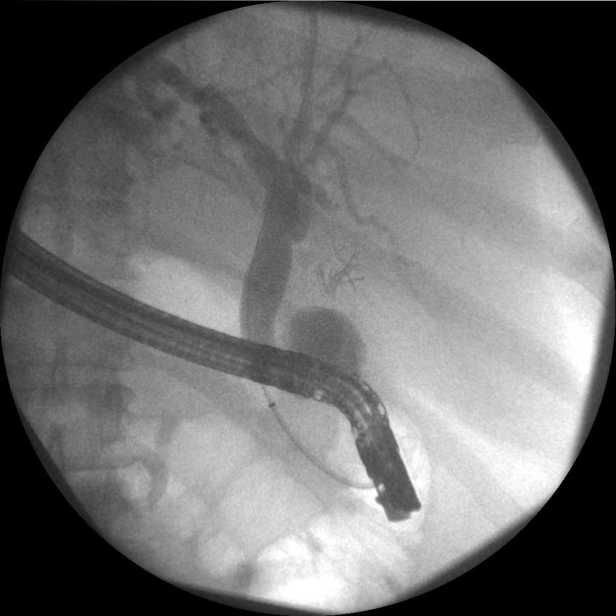
[im 4/4]
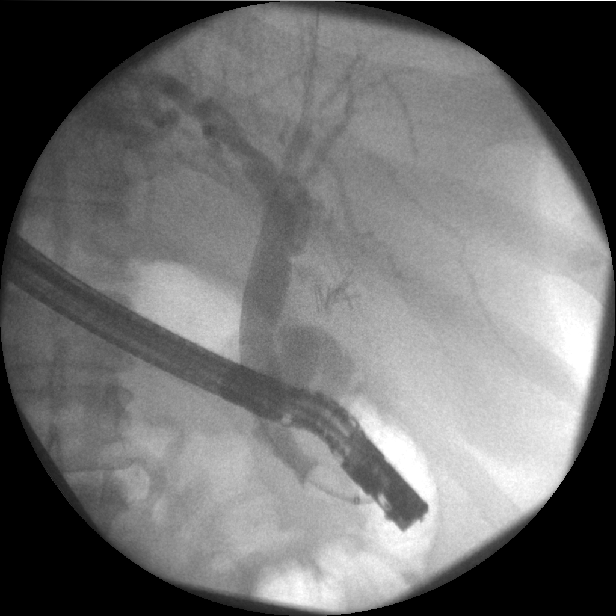

[Series 17: cont. · 1 of 1 slices shown (9 of 12)]
[im 1/1]
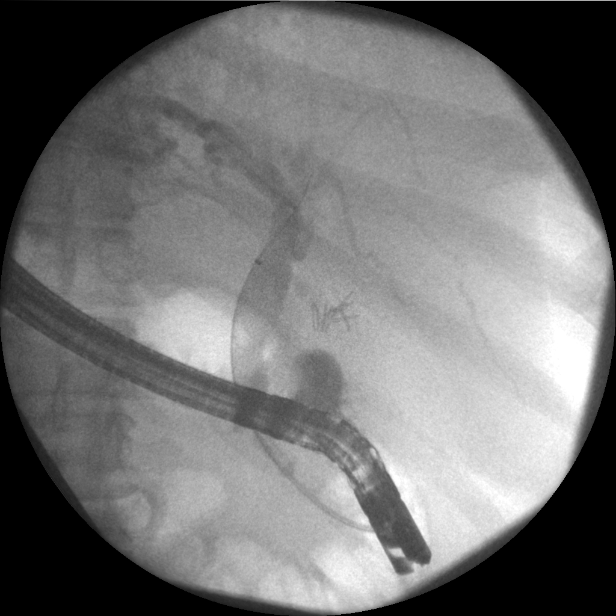

[Series 18: cont. · 1 of 1 slices shown (10 of 12)]
[im 1/1]
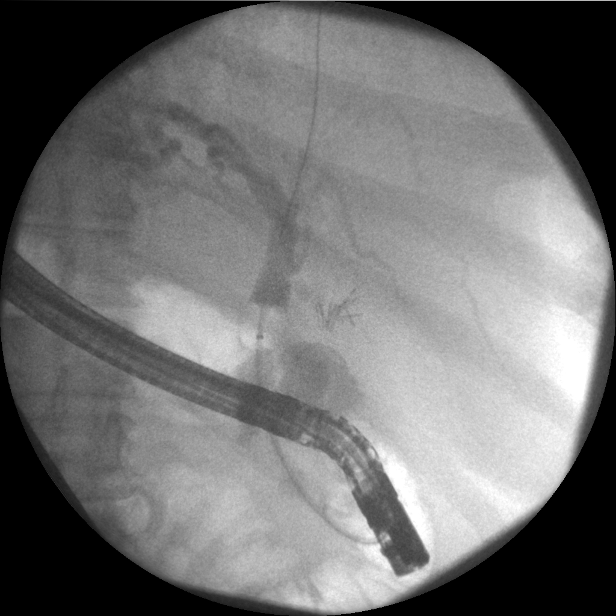

[Series 22: cont. · 1 of 1 slices shown (11 of 12)]
[im 1/1]
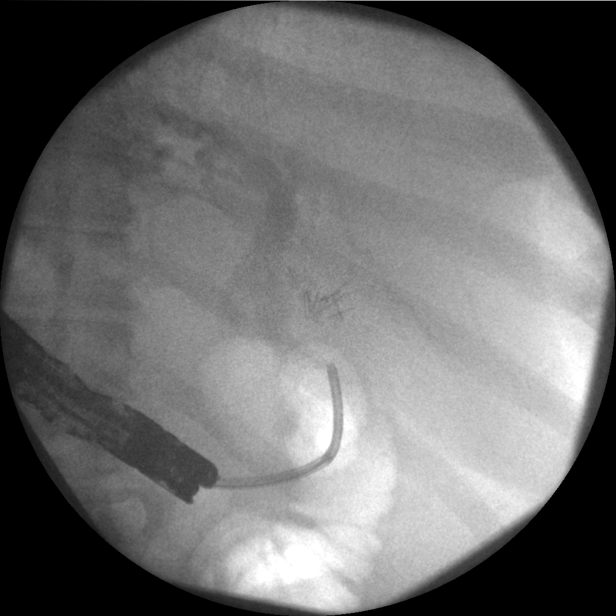

[Series 23: cont. · 1 of 1 slices shown (12 of 12)]
[im 1/1]
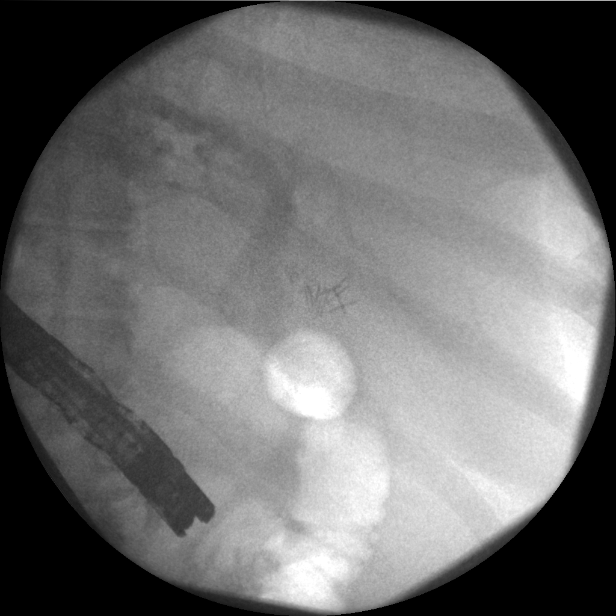

[14 of 18 positions shown; findings below may reference images not displayed]

FINDINGS: An initial image demonstrates a biliary stent and
cholecystectomy clips in the right upper quadrant of the abdomen.
Subsequent images demonstrate an endoscope and injected contrast in
the bile ducts.  Initially, filling defects were demonstrated in
the distal common duct.  These are not seen following balloon
passage.  A Spyglass scope is seen on the next to last image.  The
last image demonstrates only a faint amount of residual contrast in
the biliary system.
IMPRESSION: Endoscopic stent removal and stone extraction.

These images were submitted for radiologic interpretation only.
Please see the procedural report for the amount of contrast and the
fluoroscopy time utilized.

## 2011-01-18 IMAGING — CR DG CHEST 2V
2 series · 2 of 2 positions shown · non-contrast
Comparison: 12/27/2009

CLINICAL DATA: Chest pain.

CHEST - 2 VIEW

[w chest pa]
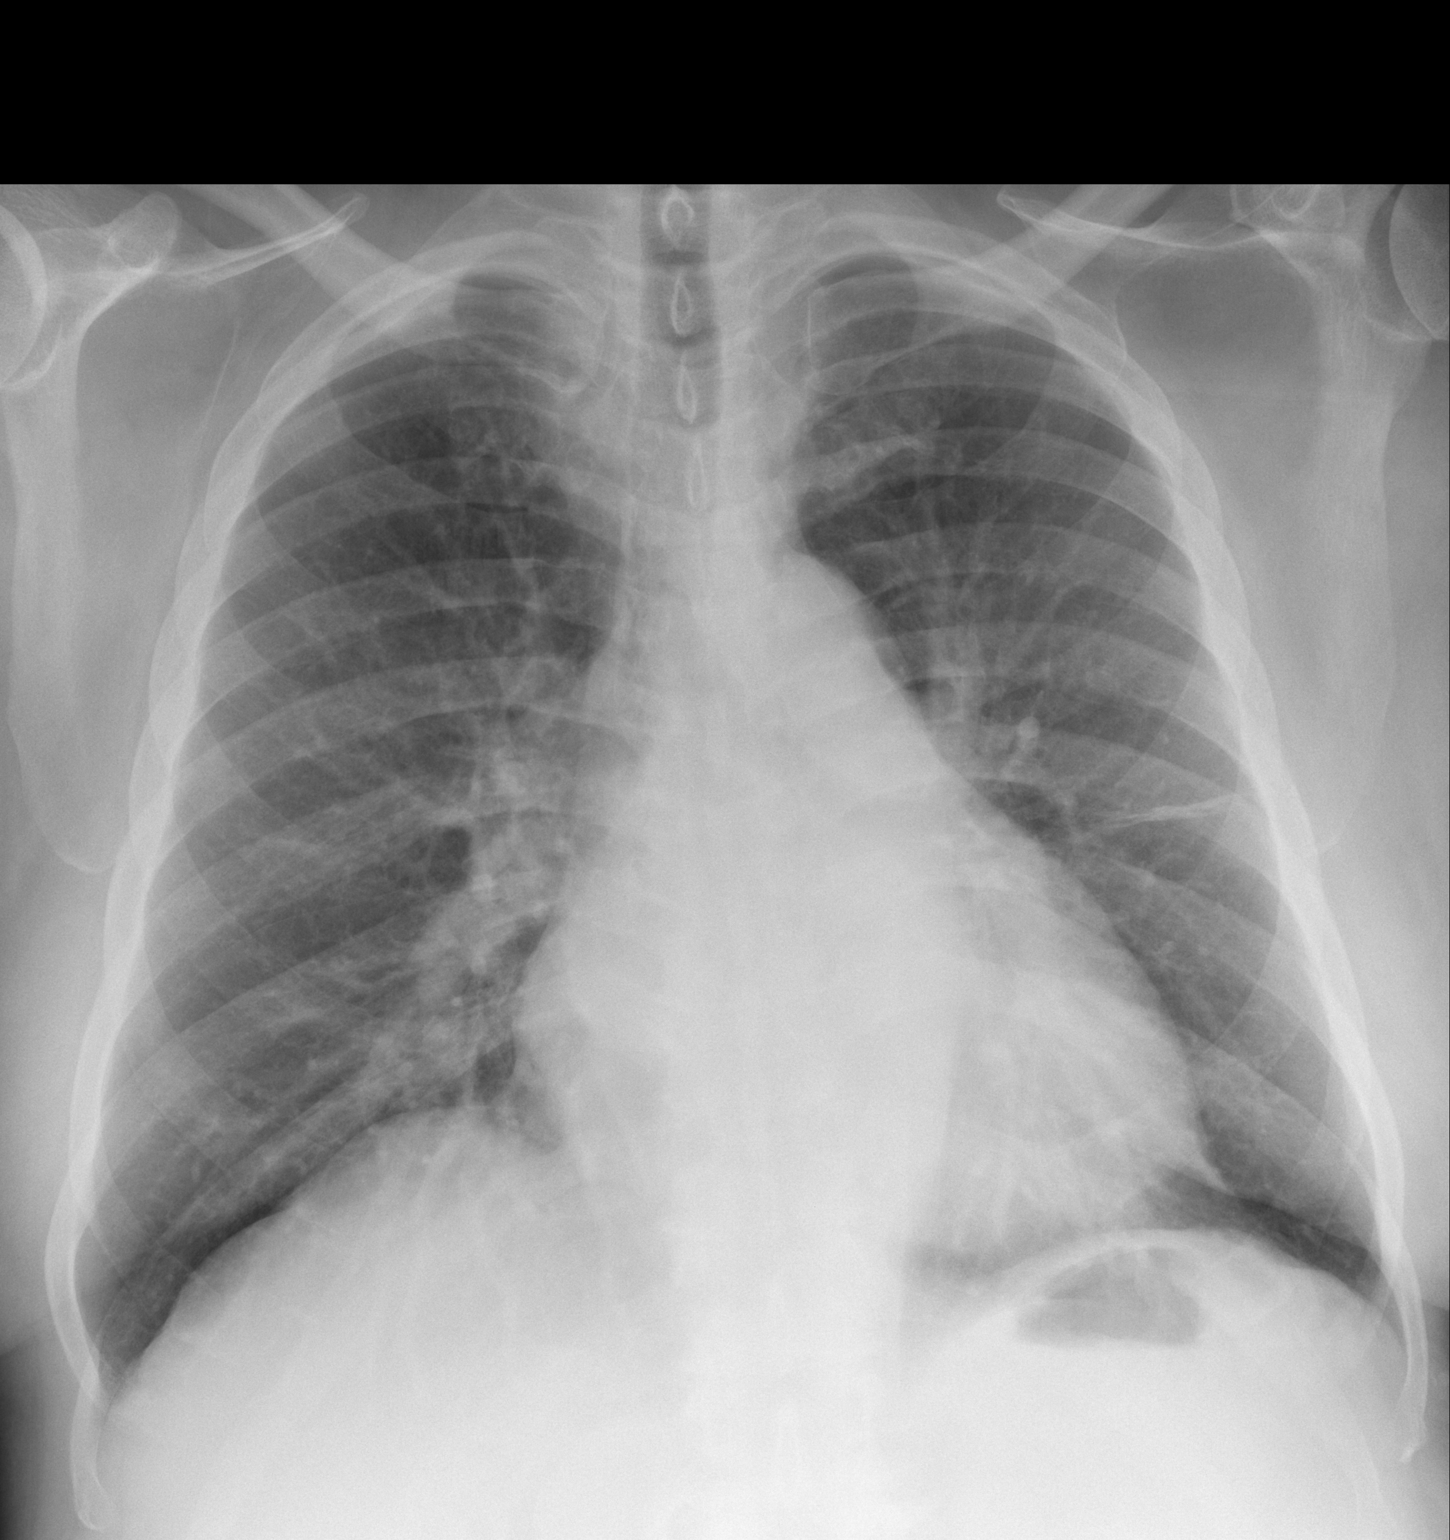

[w chest lat]
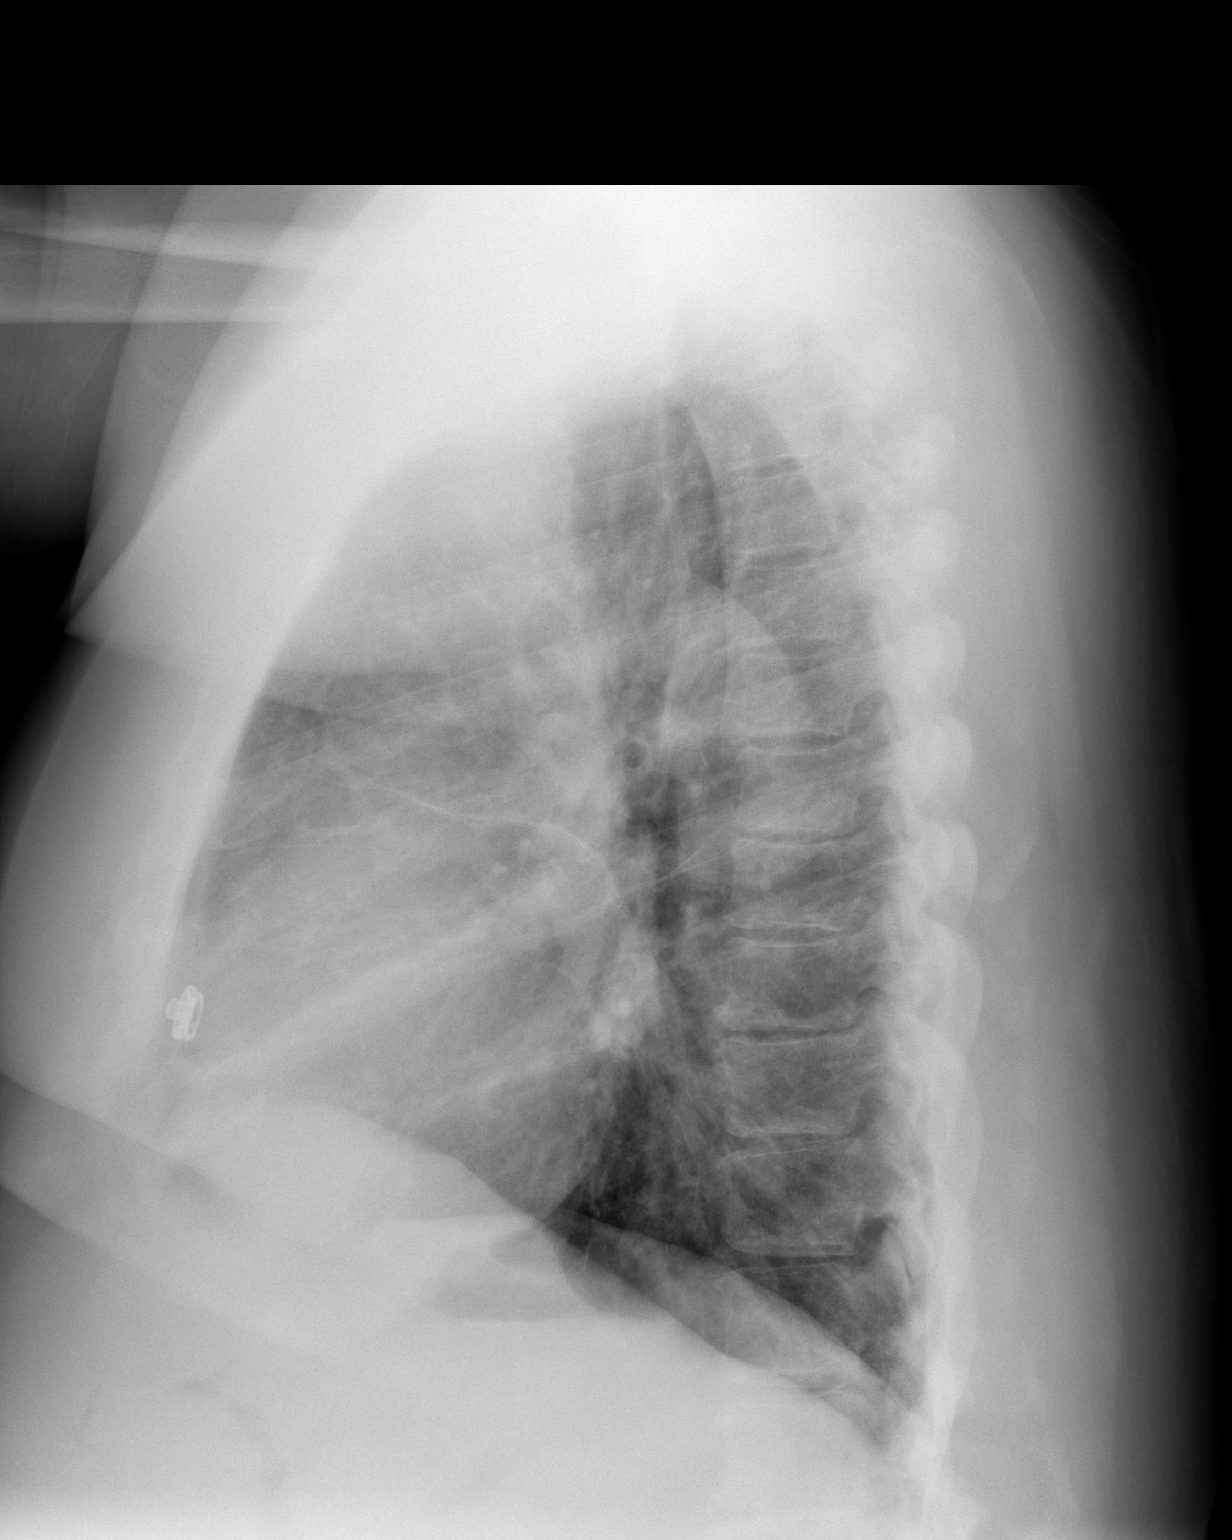

[2 of 2 positions shown; findings below may reference images not displayed]

FINDINGS: Cardiomegaly and mild pulmonary vascular congestion
identified.
Scarring within the mid left lung is again noted.
There is no evidence of focal airspace disease, pulmonary edema,
pleural effusion, or pneumothorax.
No acute bony abnormalities are identified.
IMPRESSION: Cardiomegaly and mild pulmonary vascular congestion - no evidence
of pulmonary edema.

## 2011-02-09 ENCOUNTER — Ambulatory Visit: Payer: Medicare HMO | Admitting: Internal Medicine

## 2011-02-12 ENCOUNTER — Other Ambulatory Visit (INDEPENDENT_AMBULATORY_CARE_PROVIDER_SITE_OTHER): Payer: Medicare HMO

## 2011-02-12 ENCOUNTER — Ambulatory Visit (INDEPENDENT_AMBULATORY_CARE_PROVIDER_SITE_OTHER): Payer: Medicare HMO | Admitting: Endocrinology

## 2011-02-12 ENCOUNTER — Encounter: Payer: Self-pay | Admitting: Endocrinology

## 2011-02-12 VITALS — BP 134/82 | HR 85 | Temp 97.6°F | Ht 73.0 in | Wt 252.0 lb

## 2011-02-12 DIAGNOSIS — E0590 Thyrotoxicosis, unspecified without thyrotoxic crisis or storm: Secondary | ICD-10-CM

## 2011-02-12 DIAGNOSIS — E119 Type 2 diabetes mellitus without complications: Secondary | ICD-10-CM

## 2011-02-12 MED ORDER — CYCLOBENZAPRINE HCL 10 MG PO TABS: ORAL_TABLET | ORAL | Status: DC

## 2011-02-12 NOTE — Progress Notes (Signed)
Subjective:    Patient ID: [name], male    DOB: 01-14-1951, 60 y.o.   MRN: [id number]  HPI Pt says he stopped his isulin, due to hypoglycemia.  he brings a record of his cbg's which i have reviewed today.  He checks in am only, when it is approx 100.   Because of difficulty coordinating post-i-131 rx isolation with dialysis, he was rx'ed with tapazole. He reports no change in chronic pain at the hands and feet.  There is no assoc numbness.  He says muscle relaxant has helped in the past. Past Medical History  Diagnosis Date  . Hyperthyroidism   . GASTROENTERITIS, VIRAL 10/14/2009  . ONYCHOMYCOSIS, TOENAILS 12/26/2007  . GOITER, MULTINODULAR 12/26/2007  . HYPERTHYROIDISM 02/02/2010  . DIABETES MELLITUS, TYPE II 02/01/2010  . DYSLIPIDEMIA 06/18/2007  . GOUT 06/18/2007  . Hypocalcemia 06/07/2010  . DEMENTIA 09/02/2009  . DEPRESSION 10/14/2009  . PERIPHERAL NEUROPATHY 06/18/2007  . HYPERTENSION 06/18/2007  . CAD, NATIVE VESSEL 02/06/2009  . CONGESTIVE HEART FAILURE 06/18/2007  . DIASTOLIC HEART FAILURE, CHRONIC 02/06/2009  . Unspecified hypotension 01/30/2010  . PULMONARY NODULE, RIGHT LOWER LOBE 06/08/2009  . GERD 06/18/2007  . ESRD 08/04/2010  . BENIGN PROSTATIC HYPERTROPHY 10/14/2009  . GYNECOMASTIA 07/17/2010  . NECK PAIN 07/31/2010  . FOOT PAIN 08/12/2008  . DIZZINESS 07/17/2010  . Other malaise and fatigue 11/24/2009  . GAIT DISTURBANCE 03/03/2010  . DYSPNEA 10/29/2008  . CHEST PAIN 03/29/2010  . TRANSAMINASES, SERUM, ELEVATED 02/01/2010  . COLONIC POLYPS, HX OF 10/14/2009    Past Surgical History  Procedure Date  . Stent 06/11/08   . Neck sugury 2/09  . Back surgury 1998    History   Social History  . Marital Status: Married    Spouse Name: N/A    Number of Children: N/A  . Years of Education: N/A   Occupational History  . disabled   . formerly Lawyer for [employer].    Social History Main Topics  . Smoking status: Former Research scientist (life sciences)  . Smokeless tobacco: Not on file   Comment: Quit smoking 2007 Smoked x 25 years 1/2 ppd.  . Alcohol Use: Not on file  . Drug Use: No  . Sexually Active: Not on file   Other Topics Concern  . Not on file   Social History Narrative  . No narrative on file    Current Outpatient Prescriptions on File Prior to Visit  Medication Sig Dispense Refill  . allopurinol (ZYLOPRIM) 100 MG tablet Take 100 mg by mouth daily.        [name] aspirin 81 MG EC tablet Take 81 mg by mouth daily.        [name] atorvastatin (LIPITOR) 10 MG tablet 1 by mouth every other day       . calcium-vitamin D (OSCAL WITH D) 500-200 MG-UNIT per tablet 2 by mouth three times per week       . carvedilol (COREG) 12.5 MG tablet Take 12.5 mg by mouth 2 (two) times daily.        . cinacalcet (SENSIPAR) 90 MG tablet Take 90 mg by mouth daily.        . clopidogrel (PLAVIX) 75 MG tablet Take 75 mg by mouth daily.        . Colesevelam HCl (WELCHOL) 3.75 G PACK 1 pack by mouth in water once daily       . epoetin alfa (EPOGEN) 3000 UNIT/ML injection Inject 3,000 Units into the skin 3 (three) times a  week. At dialysis       . ezetimibe (ZETIA) 10 MG tablet Take 10 mg by mouth daily.        [name] gabapentin (NEURONTIN) 300 MG capsule Take 300 mg by mouth at bedtime as needed.        . methimazole (TAPAZOLE) 5 MG tablet 1 tablet 2 times a week       . multivitamin (RENA-VIT) TABS tablet Take 1 tablet by mouth daily.        . Omega-3 Fatty Acids (FISH OIL) 1000 MG CAPS Take by mouth 3 (three) times daily.        . ranitidine (ZANTAC) 300 MG capsule Take 300 mg by mouth daily.          Allergies  Allergen Reactions  . Cephalexin   . Statins     REACTION: perceived myalgias    Family History  Problem Relation Age of Onset  . Heart disease Father   . Heart disease Sister     BP 134/82  Pulse 85  Temp(Src) 97.6 F (36.4 C) (Oral)  Ht 6\' 1"  (1.854 m)  Wt 252 lb (114.306 kg)  BMI 33.25 kg/m2  SpO2 92%   Review of Systems Denies weight change.  No fever    Objective:    Physical Exam GENERAL: no distress Neck:  There is a goiter with multinodular surface. Gait: normal and steady    Fructosamine= 294 (converts to a1c of 6.7) Lab Results  Component Value Date   TSH 0.83 02/12/2011    Assessment & Plan:  Dm, no med needed now Hyperthyroidism, well-controlled Pain in extremities, unchanged

## 2011-02-12 NOTE — Patient Instructions (Addendum)
blood tests are being ordered for you today.  please call [phone] to hear your test results.  You will be prompted to enter the 9-digit "MRN" number that appears at the top left of this page, followed by #.  Then you will hear the message. pending the test results, please stay-off the insulin, and continue methimazole. if ever you have fever while taking this medication, stop it and call us, because of the risk of a rare side-effect Please make a follow-up appointment in 3 months. i have sent a prescription to your pharmacy, for "flexeril." (update: i left message on phone-tree:  Stay-off insulin for now.

## 2011-02-13 NOTE — Consult Note (Signed)
[name]                          ENDOCRINOLOGY CONSULTATION   [name], [name]                        MRN:          [id number]  DATE:05/13/2007                            DOB:          Nov 25, 1950    REFERRING PHYSICIAN:  Ardyth Gal. Spruill, M.D.   REASON FOR REFERRAL:  Diabetes.   HISTORY OF PRESENT ILLNESS:  A 60 year old man who states a 5 year  history of type 2 diabetes which has required insulin for the past year.  His diabetes has several chronic complications.  He currently takes  Lantus 80 units q.h.s. as well as sliding scale NovoLog which totals  about 10 units a day.  He states his glucoses are below 100 in the  morning, but they are often over 200 later in the day.  He describes his  diet as excellent, but he is not able to do any exercise due to his  other medical problems.  Symptomatically, he has 1 year of severe  fatigue but no associated numbness of his feet.  He does have some  associated diaphoresis.   PAST MEDICAL HISTORY:  1. Dyslipidemia.  2. Hypertension.  3. COPD.  4. Gout.  5. CHF.  6. GERD.  7. Diabetes nephropathy with renal insufficiency.   SOCIAL HISTORY:  He is married, and he is disabled due to his low back  pain.   FAMILY HISTORY:  Positive for diabetes in his sister and father.   REVIEW OF SYSTEMS:  He has chronic shortness of breath.  He denies any  change in his weight.   PHYSICAL EXAMINATION:  Blood pressure 163/84.  Heart rate is 89,  temperature 98.1.  The weight is 293.  GENERAL:  Obese, no distress.  SKIN:  No rash.  Not diaphoretic.  HEENT:  No proptosis.  Pharynx is normal.  NECK:  No goiter.  CHEST:  Clear to auscultation.  No respiratory distress.  CARDIOVASCULAR:  There is 1+ bilateral pretibial edema.  Regular rate  and rhythm, no murmur.  Pedal pulses are decreased from normal, but they  are intact.  Carotid arteries have no bruit.  FEET:  Normal color and temperature.  There is no  ulcer present on the  feet.  NEUROLOGIC:  Alert and oriented, does not appear anxious nor depressed,  and sensation is intact to touch in the feet but decreased from normal.   Laboratory studies forwarded by Dr. Montez Morita on April 18, 2007, hemoglobin  A1c 9.2.  Creatinine is 2.7, BUN 54, carbon dioxide 16, potassium 5.7.   IMPRESSION:  1. Insulin-requiring type 2 diabetes with several serious      complications.  The patient's reported pattern of his home glucose      appears to indicate he needs more meal-time insulin and less      Lantus.  2. Renal insufficiency.  In this setting, he may, in fact, only need      meal-time insulin.  3. His osteoarthritis limits his exercise, so this complicates his      therapy.   PLAN:  1. We discussed the risk of diabetes  and the importance of diet and      exercise therapy.  2. Decrease Lantus to 60 units q.h.s.  3. Take a scheduled dosage of NovoLog 10 units t.i.d. (q.a.c.).  4. Return in a few weeks.     Sean A. Loanne Drilling, MD  Electronically Signed    SAE/MedQ  DD: 05/18/2007  DT: 05/19/2007  Job #: [id number]   cc:   Ardyth Gal. Spruill, M.D.

## 2011-02-13 NOTE — Assessment & Plan Note (Signed)
OFFICE VISIT   [name], [name]  DOB:  [birth year]                                       05/19/2010  [id number]   HISTORY OF PRESENT ILLNESS:  This patient is 60 year old gentleman who  now presents 2 days status post a right AV graft thrombectomy and  fistulogram with jump graft revision that was done on May 17, 2010.  He presents now chief complaint of right arm pain and numbness in his  fingers for 2 days.  The patient notes that he had a small amount of  tingling in his right second and third fingers immediately  postoperatively but after dialysis yesterday, he started having  increased symptoms.  Also today he noted some swelling in the surgical  site of his right arm.   On examination, he had a blood pressure of 96/60 with a heart rate of  80, temperature 98.5.  The right arm demonstrated a palpable hematoma at  the surgical site.  The surgical incision was intact with a small amount  of bleeding from the skin edge.  The skin at his right upper arm appears  a little bit tense but no skin changes or necrosis was noted here.  There is a palpable radial pulse though I did not attempt to palpate the  brachial pulse on the left side due to the presence of pain at the  surgical site.  There was some slight decrease in pinpoint perception in  the second and third fingers on the right hand.  There was still a good  hand grip.  The patient was unwilling to fully cooperative with strength  testing of the second and third digits due to pain.   He also had a right arm steal study which I interpretted to evaluate the  arteriovenous graft.  It demonstrates that the radial arteries had  triphasic flow as did the ulnar arteries.  The brachial artery was not  interrogated due to the presence of the surgical scar.  On manual  compression at the arteriovenous graft, the right radial blood pressure  drop from 137 to 128, so it was deemed a negative steal  study.   MEDICAL DECISION MAKING:  This a 60 year old patient now postop day #2  from a right arm AV graft thrombectomy and revision.  His examination is  consistent with a likely a hematoma in the right upper arm causing some  degree of neurapraxia in the radial nerve distribution.  I suspect that  his continued Plavix use probably contributed to his hematoma formation.  I recommended to the patient that he would proceed back to the operating  room for evacuation of this hematoma.  He agreed with such and we will  coordinate having him be sent back to the operative for evacuation of  this hematoma tonight.     Adele Barthel, MD  Electronically Signed   BC/MEDQ  D:  05/19/2010  T:  05/22/2010  Job:  [id number]

## 2011-02-13 NOTE — Cardiovascular Report (Signed)
NAMEKHIRY, VOELKEL                 ACCOUNT NO.:  [id number]   MEDICAL RECORD NO.:  [id number]          PATIENT TYPE:  INP   LOCATION:  3705                         FACILITY:  [facility]   PHYSICIAN:  Mohan N. Terrence Dupont, M.D. DATE OF BIRTH:  [birth year]   DATE OF PROCEDURE:  06/10/2007  DATE OF DISCHARGE:                            CARDIAC CATHETERIZATION   PROCEDURE:  Left cardiac cath with selective left and right coronary  angiography, measurement of LVEDP via right groin using Judkins  technique.   INDICATIONS FOR PROCEDURE:  Mr. Nakashima is a 60 year old black male with  past medical history significant for coronary artery disease, history of  questionable MI in the past, hypertension, history of congestive heart  failure, diabetes mellitus, COPD, benign hypertrophy of prostate,  degenerative joint disease, morbid obesity, history of gouty arthritis,  history of chronic renal insufficiency was referred from Dr. Tonita Phoenix  office for left cath, the patient complains of exertional dyspnea with  minimal exertion associated with feeling tired, fatigued, weak and no  energy.  Denies any chest pain, nausea, vomiting or diaphoresis.  Denies  PND, orthopnea or leg swelling.  The patient underwent Persantine  Myoview on April 28, 2007, which showed fixed defect in the anterior and  inferior apical segment with no evidence of ischemia with EF of 37%.  Due to multiple risk factors, questionable history of MI, depressed LV  systolic function, history of recurrent congestive heart failure,  hypertension, diabetes and exertional dyspnea, probably angina  equivalent.  The patient was referred for cath.  The patient's  creatinine is staying between 2.5-2.7.  The patient was admitted for  hydration and bicarb drip prior to left cath.  I discussed with the  patient and his wife at length regarding left cath, its risks and  benefits i.e. death, MI, stroke, need for emergency CABG, local vascular  complications, worsening renal function, stating off the procedure,  etc., and consented for the procedure.   PROCEDURE:  After obtaining informed consent, the patient was brought to  the cath lab and was placed on fluoroscopy table.  Right groin was  prepped and draped in usual fashion, and 2% Xylocaine was used for local  anesthesia in the right groin with the help of thin-wall needle 5-French  arterial sheath was placed.  The sheath was aspirated and flushed.  Next, 5-French left Judkins catheter was advanced over the wire under  fluoroscopic guidance into the ascending aorta.  Wire was pulled out,  the catheter was aspirated and connected to the manifold.  Catheter was  further advanced and engaged into left coronary ostium.  Multiple views  of the left system were taken.  Next, the catheter was disengaged and  was pulled out over the wire and was replaced with 5-French right  Judkins catheter which was advanced over the wire under fluoroscopic  guidance up to the ascending aorta.  Wire was pulled out, the catheter  was aspirated and connected to the manifold.  Catheter was further  advanced and engaged into right coronary ostium.  Multiple views of the  right system were taken.  Next the catheter was disengaged and was  pulled out over the wire and was replaced with 5-French pigtail catheter  which was advanced over the wire under fluoroscopic guidance up to the  ascending aorta.  Catheter was further advanced across the aortic valve  into the LV.  LV pressures were recorded and then pullback pressures  were recorded from the aorta.  There was no significant gradient across  the aortic valve.  LV graphy was not done due to compromised renal  function.  Next, a pigtail catheter was pulled out over the wire.  Sheaths were aspirated and flushed.   FINDINGS:  LV was not done.  LVEDP was 16 mmHg.  Left main was patent.  LAD has 50-60% proximal stenosis.  Diagonal-I and diagonal-II were  small  which were patent.  Left circumflex has 30-40% proximal stenosis.  High  OM-1 is moderate size, adjust 20-25% stenosis.  OM-2 has 20-25% proximal  stenosis.  RCA has 70-75% proximal stenosis.  PDA is patent.  PLV  branches are very small.  The patient tolerated procedure well.  There  were no complications.  The patient was transferred to recovery room in  stable condition.   PLAN:  Hydrate the patient, monitor renal function and will schedule for  PCI to RCA in a few days if renal function remains stable.      Allegra Lai. Terrence Dupont, M.D.  Electronically Signed     MNH/MEDQ  D:  06/10/2007  T:  06/10/2007  Job:  [id number]   cc:   Dr. Mackie Pai O. Spruill, M.D.  Catheterization Laboratory

## 2011-02-13 NOTE — Discharge Summary (Signed)
NAMEDEX, GANSCHOW NO.:  [id number]   MEDICAL RECORD NO.:  [id number]          PATIENT TYPE:  INP   LOCATION:  [id number]                         FACILITY:  [facility]   PHYSICIAN:  Lily Kocher, P.A.    DATE OF BIRTH:  1950-10-19   DATE OF ADMISSION:  11/28/2007  DATE OF DISCHARGE:  12/02/2007                               DISCHARGE SUMMARY   [name] is a 60 year old patient who presented initially to the  emergency department of [facility] with complaint of a chest  pain.  The pain had been going on for 2 days.  It was accompanied with  some shortness of breath and pain in his jaws.   The history and physical and hospital visits were conducted by Dr.  Wallene Huh.  It is of note that the patient has a history of  hypertension, coronary artery disease, chronic obstructive pulmonary  disease, benign prostate hypertrophy, and sleep apnea.  He was brought  to the hospital by his wife.  He complained of a chest discomfort,  weakness, some chills, and neck tingling and had a fear of impending  doom.  It is of note that the patient has a stent placed in his right  coronary artery and he has a lesion in the LAD.  The patient was  evaluated.  His vital signs were stable.  During the course of the  workup, the BUN was elevated at 56, creatinine was elevated at 3.6, the  CK-MB was elevated at 13.7, but the troponin was less than 0.05.  The D-  dimer was elevated at 2.76.   The patient was subsequently admitted, was placed on telemetry and  started on IV fluids.  The patient was seen by pharmacy for chest pain,  heparin protocol.  The patient was then initially treated with IV  nitroglycerin, this was gradually weaned off.   On March 1, the patient had a Myoview scan.  He had chest pain with  Persantine along with some problems with headache.  The nuclear medicine  myocardial imaging study revealed no stress-induced ischemia, fixed  defect in the apex extending  into the anterior wall.  There was severe  hypokinesis with an ejection fraction of 23%.   The patient complained of difficulty swallowing and esophagram was done.  This revealed mild esophageal dysmotility and small hiatal hernia with  suspicion of mild stricture at the gastroesophageal junction.  There was  mild delay of passage of 13-mm barium tablet at the same level.   The patient was seen by the speech pathology for additional evaluation  concerning the swallowing.  It was their report that the patient have  regular diet with thin liquids, small bites and sips, meds with thin  liquids, and use reflux precautions.   On December 02, 2007, it was the opinion of the attending physician as well  as the consulting physicians that the patient had received maximum  benefit from this hospitalization and could be discharged home.   The patient is to see Dr. Cristopher Peru in the office.  The patient is to  call for appointment.  The patient is also to see Dr. Lowella Bandy on March  25 at 3:45 p.m.  The patient is to be seen in the office in 2 weeks,  sooner if any changes, problems, or concerns.   MEDICATIONS:  1. Lasix 40 mg daily.  2. Colchicine 0.6 mg daily.  3. Allopurinol 100 mg daily.  4. Avodart 0.5 mg daily.  5. Hytrin 10 mg at bedtime.  6. Coreg 12.5 mg twice a day.  7. Aspirin 81 mg daily.  8. Lantus insulin 30 units at bedtime.  9. NovoLog 25 units twice a day.  10.Nitro patch 0.4 per hour.  11.Plavix 75 mg daily.  12.Ranitidine 300 mg daily.   The patient and family advised to notify the physician immediately if  any changes, problems, or concerns.      Lily Kocher, P.A.     HB/MEDQ  D:  01/15/2008  T:  01/16/2008  Job:  [id number]

## 2011-02-13 NOTE — Letter (Signed)
December 08, 2007    Ardyth Gal. Spruill, M.D.  [address] [place], [place] [address]   RE:  CARMAN, FLACH  MRN:  [id number]  /  DOB:  [birth year]   Dear Awanda Mink:  Thank you for referring Mr. Kacen Fossen for EP evaluation in  consideration for ICD implantation.  As you know, he is a very pleasant  middle-age male with longstanding diabetes and hypertension.  The  patient had normal LV function by echocardiogram back in 2007.  He  apparently sustained and out-of-hospital myocardial infarction, time  unknown, resulting in his presentation in September 2008.  He underwent  stenting of the LAD with a 70% stenosis being reduced.  The patient  notes that he did not have much improvement with his heart failure  symptoms or other symptoms after the stent was placed and has continued  to do quite poorly.  He has recently undergone stress Myoview scanning  which demonstrates a large anterior apical anterior scar with an EF of  23%.  His heart failure is severe and class III. He overall has severe  malaise and fatigue.  His carvedilol is at 12.5 twice daily, and his  wife notes that his blood pressure is quite variable, sometimes as high  as 190, sometimes in the 90-100 range.  Despite this, the patient has  not had syncope.    His exam demonstrates that he is fairly ill-appearing middle-age man in  no distress.  Vital signs were not abnormal.  Physical exam is notable  for an S3 gallop.  He does not have much in the way of edema.  We walked  in the hall today, and the patient's heart rate increased from 95 beats  a minute up to 150 beats per minute with a very slow walk, and he had to  stop because of shortness of breath.   With all the above, I recommended that Mr. Coldren proceed with ICD  implantation, though he is not certainly that he wants to do this and is  considering whether he will are not.  With regard to his congestive  heart failure, I think he needs continued slow but fairly persistent  up  titration of his beta blocker and would consider increasing to 18.75 mg  twice daily in the future with hopefully up titration up to as much as  50 mg twice a day.  Based on his baseline EKG as well as the rapid  increase in his heart rate with exercise, I think he would be better on  increased beta blocker therapy if at all possible.  I will plan to see  the patient back in several months. Do not hesitate to contact me for  any additional questions regarding Mr. Nichol.  With his QRS duration of  108 milliseconds and a short PR interval, I do not think he would  receive any benefit from upgrade to a BiV ICD.    Sincerely,      Champ Mungo. Lovena Le, MD  Electronically Signed    GWT/MedQ  DD: 12/08/2007  DT: 12/09/2007  Job #: [id number]

## 2011-02-13 NOTE — Consult Note (Signed)
NEW PATIENT CONSULTATION   [name], [name]  DOB:  26-May-1951                                       06/22/2008  [id number]   I saw the patient in the office today in consultation for hemodialysis  access.  This is a pleasant 60 year old left-handed gentleman with end-  stage renal disease secondary to diabetic nephropathy.  He was referred  for evaluation for access.  He does have some problems occasionally with  fatigue and loss of energy.  He has had no recent nausea or vomiting.   PAST MEDICAL HISTORY:  Significant for stage IV chronic kidney disease  secondary to diabetic nephropathy.  In addition he has hypertension,  obesity, depression and history of anemia.   REVIEW OF SYSTEMS AND MEDICATIONS:  Documented on the medical history  form in his chart.   PHYSICAL EXAMINATION:  This is a pleasant 60 year old gentleman who  appears his stated age.  Blood pressure 151/75, heart rate is 94.  He  has a palpable brachial and radial pulse bilaterally.  I do not see a  usable forearm cephalic vein on either side.  Lungs are clear  bilaterally to auscultation.   We did map both of his upper extremity cephalic veins and neither appear  to be adequate for a fistula.  The veins are quite small and there are  also multiple branch groups in the upper extremity and forearm.   I have recommended that we place a right arm AV graft.  We have  discussed the indications for the procedure and the potential  complications including but not limited to wound healing problems,  bleeding, arm swelling, graft thrombosis, graft infection, and steal  syndrome.  All of his questions were answered.  However, currently he is  not willing to schedule surgery.  He would like to discuss this further  with his family and will call us if he elects to schedule placement of a  right arm AV graft.   Judeth Cornfield. Scot Dock, M.D.  Electronically Signed   CSD/MEDQ  D:  06/22/2008  T:   06/23/2008  Job:  [id number]   cc:   Windy Kalata, M.D.

## 2011-02-13 NOTE — Assessment & Plan Note (Signed)
OFFICE VISIT   [name], [name]  DOB:  1951-01-01                                       09/22/2010  IN:[id number]   There is an established patient.   HISTORY OF PRESENT ILLNESS:  This is a 60 year old gentleman who has  undergone multiple access procedures on the right side, most recently an  evacuation of hematoma and a thrombectomy and revision of the venous  outflow on this right arm.  The graft had occluded postoperatively but 2  weeks after his interventions, it recanalized and actually the patient  has been successfully dialyzing through this graft over the last few  months.  Most recently he started to develop some decreased flow rate in  his right graft.  I had also previously in anticipation of possible  failure of the right arm graft, placed a left first stage basilic vein  transposition, which was completed on May 31, 2010.  I had seen him  since then, felt that there was an  intact thrill and that some  additional time was necessary for it to further dilate.  This was on  July 14, 2009.  At this point he continues to have some numbness in  his right first and second fingers although he is able to complete his  activities of daily living and does not really complain of steal  symptoms.  The anesthesia that he feels in these 2 fingers he says is  improved from previous.  In terms of his left hand he denies any steal  symptoms and is able to complete his activities of daily living with his  left hand .   Past medical history, past surgical history, social history, family  history of systems, medications and allergies are unchanged except for  the surgeries as noted above.   PHYSICAL EXAMINATION:  He had a blood pressure 125/83, a heart rate of  80, respirations were 16.  GENERAL:  Well-developed, well-nourished, in no apparent distress.  EXTREMITIES:  His right arm has well-healed incisions.  The scars are  much softer than previously.   There is a palpable pulse throughout the  graft without a thrill.  On auscultation there is a bruit throughout the  graft. At the level of the most recent revision,  there is a  high-  pitched bruit at this location.  His sensation is grossly intact in the  right hand and he is able to the hand grip 5/5.  In the left arm there  is a capillary refill  less than 2 seconds and throughout the fingers.  Hand grip is 5/5 in this hand and sensation is grossly intact.  Thrill  in the basilic vein is  difficult to feel.  But on auscultation, there  is a strong bruit.  The antecubital incision is well-healed .  I  examined the basilic vein under ultrasound exam and throughout the vein  appears to be greater than 6 mm in diameter but  is noted to be quite  deep, at some points greater than 2 cm in depth.   MEDICAL DECISION MAKING:  This is a 60 year old gentleman with a right  forearm graft that has been revised multiple times.  At this point I  would no longer revise this graft as the venous outflow is  poor at  best.  To buy him some  additional time I would do a fistulogram on the  right forearm graft and then possibly balloon angioplasty the venous  outflow with the plan to buy him enough time for the left second stage  basilic vein transposition to heal so that this can become his dominant  access.  However there is some concern after this many months of further  dilation of this basilic vein transposition, that the basilic vein may  not be adequate for long term dialysis.  It still remains at some  locations only about 6 mm and ideally I would have preferred an even  greater dilatation.  We discussed the risks  of both procedures.  For  the fistulogram, there is some risk of rupture of the venous outflow and  possible thrombosis of the graft.  If this were to occur, I told him  that he would just need tunnel dialysis catheter placement;  as I would  not revise his graft due to poor venous  outflow status.  In terms of the  left arm with second stage basilic vein transposition, there is risk of  bleeding, infection and possible nerve injury, possible ischemic  monomelic neuropathy,  possible steal syndrome, possible failure to  mature in addition and possible need for additional procedures.  He is  aware of these risks and we will go after this in a staged fashion.   On Wednesday, on December 28, we will attempt a fistulogram possible  intervention and then in the second week,  October 09, 2010, we will do  the second stage basilic vein transposition.  Hopefully, the additional  bridging procedure that will be done next Wednesday will be adequate to  keep his graft going long enough for his second stage basilic vein  transposition to heal.     [name] Blessing, MD  Electronically Signed   BLC/MEDQ  D:  09/22/2010  T:  09/22/2010  Job:  [id number]

## 2011-02-13 NOTE — Discharge Summary (Signed)
[name], [name]                 ACCOUNT NO.:  [id number]   MEDICAL RECORD NO.:  [id number]          PATIENT TYPE:  INP   LOCATION:  [id number]                         FACILITY:  [facility]   PHYSICIAN:  Ardyth Gal. Spruill, M.D.DATE OF BIRTH:  1951-08-13   DATE OF ADMISSION:  07/19/2008  DATE OF DISCHARGE:  07/23/2008                               DISCHARGE SUMMARY   DISCHARGE DIAGNOSES:  1. Recurrent depression, severe.  2. Chronic kidney disease, stage V.  3. Hypertensive renal disease with renal failure.  4. Diabetes mellitus.  5. Hypertrophy of the prostate without urinary obstruction.  6. Coronary atherosclerosis of the native vessels.  7. Congestive heart failure.   [name] is a 60 year old patient with a history of hypertension, type  2 diabetes mellitus, end-stage renal disease, benign prostatic  hypertrophy, coronary artery disease who presented to the office  approximately a week ago with weakness and dyspnea.  He denied chest  pain, had outpatient lab studies, which confirmed stage IV chronic renal  disease with anemia.  The patient's wife reported increasing problems  with weakness and lassitude.  On today's examination, July 19, 2008,  it was felt that the patient should be admitted.   The patient was admitted to the medical service for additional  evaluation of his diabetes mellitus, his problems with possible  congestive heart failure as he was noted on exam to have pitting edema,  and to have a Nephrology consultation.   On July 20, 2008, the patient was evaluated by Dr. Justin Mend with Renal  Medicine.  It was Dr. Jason Nest opinion that the patient had chronic renal  failure, stage IV/V, but he did not feel that the patient was bulimic.  It was also his opinion that the patient had some problems with  depression with crying spells with poor outlook on life.  It is of note  that the patient received AV graft on July 09, 2008, and the patient  would need to wait an  additional 2 weeks prior to using this  ventriculogram.   The patient was fitted with CPAP machine by respiratory therapy.  It was  noted that the iron was slightly low, and the patient was prescribed  Hemovite Plus daily by Dr. Justin Mend.   There was noted a slight area of moistness at the AVG graft.  Just prior  to discharge, the patient was seen by Dr. Scot Dock with Vascular Surgery  and indeed there was found a slight opening; however, there was no frank  pus or bleeding.  There was no erythema noted.   The patient is to be on antibiotic therapy and follow up with Dr.  Scot Dock as an outpatient.   The patient was also seen by Dr. Rhona Raider.  It is of note that he has  had approximately 6 weeks of progressive depressed mood, lack of  interest, low energy, and decreased concentration.  The patient was  evaluated, and it was Dr. Daylene Katayama opinion that the patient had some  mood disorder, major depressive disorder, that was recurrent.  It was  his opinion that the patient was not  at risk to harm himself or others.  The patient agreed to call emergency service immediately if any thoughts  of harming himself or others were to come about or overwhelm him.  The  patient was placed on Celexa 10 mg daily.  The patient is to follow up  as an outpatient but is to notify the physician or emergency services  immediately if any changes or problems.   MEDICATIONS AT DISCHARGE:  1. Ranitidine 30 mg daily.  2. Hytrin 10 mg daily.  3. Avodart 0.5 mg daily.  4. Novolin 70/30 insulin, 40 units each morning and 25 units at      bedtime.  5. 81 mg of aspirin.  6. Allopurinol 100 mg daily.  7. Plavix 75 mg daily.  8. Coreg 25 mg b.i.d.  9. Norvasc 10 mg daily.  10.Imdur 60 mg daily.  11.Lasix 80 mg t.i.d.  12.Celexa 10 mg daily.   The patient is to be seen by [facility] and also to be seen in  the office in 3 weeks, and he is to call for arrangements for an  appointment as noted above by  outpatient behavior health.      Lily Kocher, P.A.      Ardyth Gal. Spruill, M.D.  Electronically Signed    HB/MEDQ  D:  11/02/2008  T:  11/03/2008  Job:  [id number]

## 2011-02-13 NOTE — Discharge Summary (Signed)
[name], [name]                 ACCOUNT NO.:  [id number]   MEDICAL RECORD NO.:  [id number]          PATIENT TYPE:  INP   LOCATION:  [id number]                         FACILITY:  [facility]   PHYSICIAN:  Debbrah Alar, NP DATE OF BIRTH:  01-Oct-1951   DATE OF ADMISSION:  07/17/2007  DATE OF DISCHARGE:  07/23/2007                               DISCHARGE SUMMARY   DISCHARGE DIAGNOSES:  1. Dizziness secondary to presumed positional vertigo.  2. Elevated CK levels, question mild rhabdomyolysis on admission.      Creatinine remained stable.  3. Chronic renal insufficiency at baseline.  4. History of coronary artery disease with stents. Plan to continue      Plavix, aspirin and beta blocker.  5. Dyslipidemia on statins.  6. History of right-sided heart failure, clinically compensated this      admission.  7. History of obstructive sleep apnea and obesity hypoventilation      syndrome.  Plan to continue CPAP.  8. Benign prostatic hypertrophy, stable on home Hytrin and Avodart.  9. History of sick sinus syndrome  10.History bipolar disorder, stable.  11.Anemia of chronic disease.  12.Hypertension.   HISTORY OF PRESENT ILLNESS:  [name] is a 77-year male with multiple  medical problems including recent RCA stent placement September 2008 who  presented to the emergency department with chief complaint of  lightheadedness, dizziness and weakness. He also noted a 2-day history  of profuse diarrhea prior to these symptoms.  He was admitted for  further evaluation and treatment.   PAST MEDICAL HISTORY:  1. Coronary artery disease status post PTCA with TAXUS stent to RCA      September 2008 performed by Dr. Terrence Dupont.  2. Hypertension.  3. CHF.  4. Diabetes, type 2.  5. BPH.  6. Chronic renal insufficiency.  7. COPD.  8. Dilated cardiomyopathy.  9. Gout.  10.History of tobacco abuse.  11.Bipolar disorder.  12.Obesity hypoventilation syndrome and obstructive sleep apnea.  13.Angina.  14.History of sick sinus syndrome,  15.History of anemia.   HOSPITAL COURSE:  #1.  DIZZINESS:  The patient was admitted and was given IV hydration for  mild dehydration in setting of acute gastroenteritis.  He was noted to  have a mild metabolic acidosis on followup July 18, 2007.  CBGs  remained stable.  He was given gentle IV fluid. He was also noted to  have mild elevation of CK levels; however, his creatinine remained  normal.  He denied any chest pain. A neurology consult was requested,  and the patient was seen in consultation by Dr. Estella Husk.  He recommended  checking an MRI which was negative for acute infarct. An MRA showed no  significant stenosis. It was felt by neurology the patient's symptoms  were secondary to positional vertigo, likely labyrinthitis etiology.  They recommended vestibular rehab exercises and continuation of aspirin  as prior to admission. The patient also underwent carotid duplex which  showed no evidence of ICA stenosis   PERTINENT LABORATORY DATA:  At time of discharge, BUN 49, creatinine  2.68   DISPOSITION:  The patient was discharged to  home.   FOLLOW UP:  The patient is instructed to follow up with Dr. Renato Shin  in 1-2 weeks and contact the office for an appointment.  He is also  instructed to call for outpatient vestibular rehab at [facility] outpatient  rehab.   DISCHARGE MEDICATIONS:  1. Aspirin 325 mg p.o. daily.  2. Plavix 75 mg p.o. daily.  3. Allopurinol 100 mg p.o. daily.  4. Coreg CR 40 mg p.o. daily as before.  5. Hytrin 5 mg p.o. daily.  6. Lantus insulin 30 units subcutaneously daily as before.  7. Sulindac 200 mg daily.  8. Colchicine 0.6 mg p.o. daily.  9. Insulin, continue same dosing as before.  10.Ranitidine 300 mg daily.  11.Adalat 300/25 as before.  12.Lisinopril 40 mg p.o. daily.  13.Meclizine 12.5 mg p.o. q.8 h x3 days.  14.Reglan 5 mg p.o. 3 times a day for 3 days, then as needed.  15.Valium 2.5 mg p.o. b.i.d. x3  days, then as needed.   DISPOSITION:  The patient was discharged to home.      Debbrah Alar, NP     MO/MEDQ  D:  08/18/2007  T:  08/18/2007  Job:  [id number]

## 2011-02-13 NOTE — Discharge Summary (Signed)
[name], [name]                 ACCOUNT NO.:  [id number]   MEDICAL RECORD NO.:  [id number]           PATIENT TYPE:   LOCATION:                                 FACILITY:   PHYSICIAN:  Melburn Hake, M.D.DATE OF BIRTH:  Mar 22, 1951   DATE OF ADMISSION:  04/30/2007  DATE OF DISCHARGE:  05/08/2007                               DISCHARGE SUMMARY   ADMITTING DIAGNOSES:  Severe gastroparesis.   DISCHARGE DIAGNOSES:  1. Esophageal reflux with delayed gastric emptying.  2. Peritoneal infections.  He has an intestinal infection due to C.      diff, also has some evidence of hepatomegaly, type 2 diabetes      mellitus, obesity, hypertension.  The patient has adult sleep      apnea, some severe depression, and also some azotemia, chronic      renal disease.   BRIEF HISTORY AND PHYSICAL AND HOSPITAL COURSE:  [name] is a 60-year-  old African-American male.  He had just been discharged several weeks  prior to this admission, with diabetes, hypertension, cardiomegaly,  COPD, and sleep apnea.  He has a history of being a very heavy smoker.  The patient came to the office on the day of admission saying he felt  terrible, no appetite.  Had been in the office back on the 6th, about 3  weeks ago.  He weighed about 275 pounds and on this visit to the office  he weighed about 262.  He says he is not on a diet, just no appetite,  depressed, said he is about at the end of his rope.  Physical  examination revealed a normal temperature, pulse of 86, blood pressure  98/72, weight was 262, respirations 17.  Glucose was 214.  The patient  seemed very depressed.  Poor dentition, neck range was flat.  Chest was  fairly clear.  Some course rhonchi in both bases.  Abdomen was  protuberant with active bowel sounds.  No masses, no organomegaly.  He  could move all of his extremities.  Reflexes were equal bilaterally.  The patient was given bed rest.  We did blood gases.  He was put on C-  PAP.  He had  some complaint about the noise of the C-PAP machine.  He  said he was having some very soft stools, occasional abdominal pains.  His creatinine was 3.7, his hemoglobin A1c was 10.5.  Uric acid 9.0.  He  was started on Allopurinol and colchicine.  We also added some Pepcid to  the patient's diet.  He had stools positive for C. dif.  He had been  complaining about some abdominal pains and diarrhea.  The patient was  started on Flagyl 500 mg t.i.d.  There was some delay in gastric  emptying.  Gradually he began to feel some better, however he did have  some more diarrhea and spiked a temperature of 101.1.  Gradually as he  was treated for his C. dif and his reflux esophagitis, he began to feel  better and we continued him on his Flagyl at 250 3 times  a day.  His  elevated creatinine gradually went down to 1.9.  He still has some  chronic renal disease but treating the dehydration and the uric acid  elevation helped decreased.   DISCHARGE MEDICATIONS:  He was discharged home on Flagyl for another  week.  He was put on some Lexapro started by psychiatry for his  depression, 10 mg daily.  Lantus insulin 35 units at night, BiDil 1 3  times a day, and Coreg 6.25 mg b.i.d., Vytorin 10/20 once daily, and  Protonix 40 mg a day.  We continued him on his Allopurinol 100 mg 3  times a day, and Reglan 5 mg a.c. and h.s.  We will decide what level of  his daytime insulin after discharge in the office, giving his some Fol-Q  one cap b.i.d. __________ , continued on Flagyl up to q.i.d. 250  mg.  Tylenol as needed for pain.  We are going to hold his Ventolin for  right now.  He is not having breathing problems.   FOLLOW UP:  The patient was discharged home, and will be seen in 2 weeks  in the office.           ______________________________  Melburn Hake, M.D.     CEF/MEDQ  D:  05/03/2007  T:  05/03/2007  Job:  [id number]

## 2011-02-13 NOTE — Op Note (Signed)
NAMEANNE, MANEVAL                 ACCOUNT NO.:  [id number]   MEDICAL RECORD NO.:  [id number]          PATIENT TYPE:  AMB   LOCATION:  ENDO                         FACILITY:  [facility]   PHYSICIAN:  Ronald Lobo, M.D.   DATE OF BIRTH:  23-Jul-1951   DATE OF PROCEDURE:  04/08/2009  DATE OF DISCHARGE:                               OPERATIVE REPORT   PROCEDURE:  Colonoscopy with polypectomy and biopsy.   INDICATIONS:  A 60 year old gentleman for screening colonoscopy.  He had  a negative exam about 5 years ago, but is now being considered for  kidney transplant and pre-transplant evaluation requested updated  colonoscopic screening.   FINDINGS:  Minimal diverticular change.  Several small polyps.   PROCEDURE:  The procedure was familiar to the patient from prior  examinations and he provided written consent.  Sedation was very  conservative in view of a history of sleep apnea and a history of  desaturation with his prior exam.  Therefore, we did not use any opiate  sedation and used only Versed, 4 mg IV, which resulted in only mild  sedation, but no problem with significant desaturation.  He did drop  transiently down into the high 80s, but that corrected almost instantly  on its own.   Digital exam of the prostate was normal.   The Pentax adult video colonoscope was readily advanced around the colon  to the cecum as identified by visualization of the appendiceal orifice  and pullback was then performed.  The quality of prep was very good and  it was felt that all areas were well seen.   In the transverse colon was a 5 mm semi-pedunculated polyp removed by  cold snare and retrieved by suctioning through the scope.  Nearby were  two or three small sessile polyps, cold biopsied.  In the sigmoid region  was an additional pedunculated polyp measuring about 5-6 mm across,  again removed by cold snare and touching up the base with the cold  biopsy.   No large polyps, cancer, colitis, or  vascular ectasia were noted.  Retroflexion the rectum and re-inspection of the rectum were  unremarkable.  The patient tolerated the procedure well and there were  no apparent complications.   IMPRESSION:  1. Five polyps removed, two of which were small to medium in size, and      the rest were diminutive, under 5 mm.  2. Minimal diverticular change.   PLAN:  Await pathology results with surveillance interval to depend on  the histologic findings.  If all of the polyps are hyperplastic in  character, the patient could potentially wait as long as 10 years prior  to his next exam.           ______________________________  Ronald Lobo, M.D.     RB/MEDQ  D:  04/08/2009  T:  04/08/2009  Job:  [id number]   cc:   Hilliard Clark A. Loanne Drilling, [place]  [place] [address]

## 2011-02-13 NOTE — Assessment & Plan Note (Signed)
OFFICE VISIT   [name], [name]  DOB:  [birth year]                                       05/26/2010  [id number]   ADDENDUM:  This gentleman underwent the bilateral upper extremities vein mapping  which I reviewed and it appears that his forearm cephalic the cephalic  and upper arm cephalic vein is not suitable in the left arm.  On his  right side, his basilic vein is unsuitable for graft placement.  However, his left basilic vein in the upper arm ranges from 0.4 cm to  0.23 cm.  What I believe is the antecubital vein is up to 0.26 cm, so it  may be an acceptable.  We can only determine this under operative  exploration, so I have discussed with the patient setting him up for a  left first stage basilic vein transposition with the possibility of  placement of a forearm graft  in case this vein is inadequate for a  placement of a first stage basilic vein transposition.  We discussed  extensively the risk of developing a steal syndrome and also I have  discussed with him the possibility of his monomelic neuropathy syndrome  and he agrees to proceed forth. Of note, in the future, if the access  procedures on his left arm fail, we will return to doing access  procedures on his right arm but given the persistence of some neurologic  symptoms in the right arm and the recent procedures on there, I feel  that it would be more prudent to allow him to heal up his right arm  prior to proceeding with any interventions on the right arm, so we will  attempt first the left sided basilic vein transposition.     Adele Barthel, MD  Electronically Signed   BC/MEDQ  D:  05/26/2010  T:  05/29/2010  Job:  [id number]

## 2011-02-13 NOTE — Assessment & Plan Note (Signed)
OFFICE VISIT   COUNCIL, PENCEK  DOB:  [birth year]                                       10/11/2010  [id number]   I saw the patient in the office today because he had been having some  drainage from his incision in the left arm where he had a basilic vein  transposition.  This was done in 2 stages by Dr. Bridgett Larsson.  He developed  some swelling in the arm and the wife noted some intermittent drainage  from the left arm.  He has had no fever or chills.  The drainage  occurred yesterday and the day before.  He has not had any significant  drainage today.   On examination, the fistula has a good thrill and audible bruit.  He has  moderate swelling in the upper arm.  The incision appears to be intact.  Currently, I do not see any areas that are draining.   I have explained that the options are either to simply follow this and  the hematoma will gradually absorb itself, versus exploring this and  washing out the hematoma.  I have explained that as there is no  compromise of the skin, I think it is perfectly reasonable to follow  this conservatively.  I have explained that draining it does slightly  increase his risk of infection.  Again, because the skin is not  compromised, I do not think this is indicated at this point.  He has no  signs of infection.  I have instructed him to simply keep gauze over it  and hold the gauze in place loosely with a 4-inch Ace bandage.  I have  also instructed him to elevate the arm.  He will keep his regularly  scheduled appointment in 4 weeks with Dr. Bridgett Larsson.  If he has any further  drainage, he will call and be seen sooner.     Judeth Cornfield. Scot Dock, M.D.  Electronically Signed   CSD/MEDQ  D:  10/11/2010  T:  10/12/2010  Job:  [id number]

## 2011-02-13 NOTE — Cardiovascular Report (Signed)
[name]                 ACCOUNT NO.:  [id number]   MEDICAL RECORD NO.:  [id number]          PATIENT TYPE:  OIB   LOCATION:  1965                         FACILITY:  [facility]   PHYSICIAN:  [name], MDDATE OF BIRTH:  [birth year]   DATE OF PROCEDURE:  02/11/2009  DATE OF DISCHARGE:  02/11/2009                            CARDIAC CATHETERIZATION   [name] is a very pleasant 60 year old male with multiple medical problems  including COPD, sleep apnea, end-stage renal disease, and diastolic  heart failure.  He presented to the office complaining of persistent  dyspnea.  He is referred for right heart catheterization to assess his  pulmonary pressures as well as LV filling pressures.   PROCEDURES PERFORMED:  Right heart catheterization.   DESCRIPTION OF PROCEDURE:  The risks and indications were explained.  Consent was signed and placed on the chart.  A 7-French venous sheath  was placed in the right femoral vein using modified Seldinger technique.  Standard Swan-Ganz catheter was used.  No apparent complications.   FINDINGS:  1. Right atrial pressure mean of 7.  2. RV pressure 33/12.  3. PA pressure 30/17 with a mean of 22.  4. Pulmonary capillary wedge pressure mean of 8.  5. Fick cardiac output 7.3 L/min.  Cardiac index 3.0 L/min/sq. m.  6. Pulmonary vascular resistance is 1.9 Woods unit.  7. Arterial sat was 88% on room air.  8. PA saturation was 65%.   ASSESSMENT:  1. Normal filling pressures.  2. Mild hypoxemia.   PLAN/DISCUSSION:  His filling pressures look good.  There is no evidence  of pulmonary hypertension.  We will continue current therapy.  We will  start him on home oxygen.      [name] Pascal. Bensimhon, MD  Electronically Signed     DRB/MEDQ  D:  02/11/2009  T:  02/12/2009  Job:  [id number]   cc:   [name], M.D.  [name], M.D.

## 2011-02-13 NOTE — Assessment & Plan Note (Signed)
OFFICE VISIT   KOSTA, PAULMAN  DOB:  [birth year]                                       08/17/2008  [id number]   I saw the patient in the office today for continued followup of his  right antecubital incision.  He had a right forearm AV graft placed on  July 09, 1989 and had some slight separation of his wound, which I  have been following.   EXAMINATION:  There is no erythema or drainage.  The wound appears to be  healing slowly.   I have instructed the family to continue to keep bacitracin and a Band-  Aid on the wound.  I think this will heal without any problem.  The  graft has an excellent thrill.  He is not yet on dialysis.  Will see him  back p.r.n.   Judeth Cornfield. Scot Dock, M.D.  Electronically Signed   CSD/MEDQ  D:  08/17/2008  T:  08/18/2008  Job:  [id number]

## 2011-02-13 NOTE — Consult Note (Signed)
NAMEJOVE, HANNINEN                 ACCOUNT NO.:  [id number]   MEDICAL RECORD NO.:  [id number]          PATIENT TYPE:  INP   LOCATION:  [id number]                         FACILITY:  [facility]   PHYSICIAN:  Felizardo Hoffmann, M.D.  DATE OF BIRTH:  [birth year]   DATE OF CONSULTATION:  07/21/2008  DATE OF DISCHARGE:                                 CONSULTATION   REASON FOR CONSULTATION:  Depression.   HISTORY OF PRESENT ILLNESS:  Mr. Capretta is a 60 year old male admitted to  the [facility] on July 19, 2008, with shortness of breath.   Mr. Moussa has been experiencing a number of worsening general medical  problems.   He has 6 weeks of progressive depressed mood, lack of interest, low  energy, decreased concentration.  He has had no suicidal thoughts.  He  has had no hallucinations or delusions.  He does retain orientation and  memory function.   His general medical care has not reversed the symptoms nor has verbal  encouragement.   PAST PSYCHIATRIC HISTORY:  Mr. Kois does have a history of depression  in review of the past medical record.  In August of 2007, depression was  listed and he was prescribed Lexapro, however, he never took it because  of the price.   He has no history of increased energy or decreased need for sleep.  He  has no history of hallucinations or delusions.  He has no history of  suicide attempts.   FAMILY PSYCHIATRIC HISTORY:  Mother with Alzheimer disease.   PAST MEDICAL HISTORY:  1. Diabetes mellitus.  2. Coronary artery disease.  3. Acute renal failure.  4. Chronic renal disease.  5. Coronary artery disease with history of stenting.   MEDICATIONS:  His MAR is reviewed.  He is not on any current  psychotropic medication.   ALLERGIES:  KEFLEX.   LABORATORY DATA:  SGOT 22, SGPT 22, sodium 143, BUN 79, creatinine 5.43,  glucose 126, WBC 5.8, hemoglobin 9.1, platelet count 271, TSH and free  T4 unremarkable.   REVIEW OF SYSTEMS:  CONSTITUTIONAL,  HEAD, EYES, EARS, NOSE, THROAT,  MOUTH, NEUROLOGIC, PSYCHIATRIC, CARDIOVASCULAR, RESPIRATORY,  GASTROINTESTINAL, GENITOURINARY, SKIN, MUSCULOSKELETAL, HEMATOLOGIC,  LYMPHATIC, ENDOCRINE, METABOLIC:  All unremarkable.   EXAMINATION:  VITAL SIGNS:  Temperature 97.3.  Pulse 73.  Respiratory  rate 18.  Blood pressure 152/92.  O2 saturation on room air 98%.  GENERAL APPEARANCE:  Mr. Contorno is a middle-aged male lying in a supine  position in his hospital bed with no abnormal involuntary movements.   MENTAL STATUS EXAM:  Mr. Wisely is having a mild decrease in attention.  His eye contact is good.  His concentration is decreased.  Affect is  constricted.  Mood is depressed.  He is oriented to all spheres.  His  memory is intact to immediate, recent, and remote.  His fund of  knowledge and intelligence are within normal limits.  His speech  involves slightly flat prosody.  His volume is soft.  There is no  dysarthria.  Thought process, logical, coherent, goal directed.  No  looseness of associations.  Thought content, no thoughts of harming  himself.  No thoughts of harming others.  No delusions.  No  hallucinations.  Insight is intact.  Judgment is intact.   ASSESSMENT:  AXIS I:  1. 293.83, mood disorder, not otherwise specified (idiopathic and      general medical factors).  2. 296.33, major depressive disorder, recurrent, severe.  AXIS II:  None.  AXIS III:  See past medical history.  AXIS IV:  General medical.  AXIS V:  55.   Mr. Cobbs is not at risk to harm himself or others.  He agrees to call  emergency services immediately for any thoughts of harming himself,  thoughts of harming others, or distress.   The undersigned provided ego supportive psychotherapy and education.   At the request of the patient, the patient's wife attended the session  to facilitate education and support.   The indications, alternatives, and adverse effects of Celexa were  discussed with the patient for  antidepression including the pattern of  Celexa generic now being reduced to a cost that they can afford.   Also, the benefit of Celexa regarding Celexa's hepatic metabolism in the  context of several other medications was discussed.   They understand and would like to proceed with Celexa.   RECOMMENDATION:  Would start Celexa 10 mg q.a.m. and then would increase  over the next week at 4th day as tolerated to 20 mg q.a.m. with caution  about nausea and loose stools as a side effect.   Outpatient psychiatric followup can be set up by the social worker and  is available at one of the clinics attached to [place], or [place].   Mr. Pizzini could benefit from cognitive behavioral therapy in addition  to psychotherapeutic medication management.      Felizardo Hoffmann, M.D.  Electronically Signed     JW/MEDQ  D:  07/22/2008  T:  07/22/2008  Job:  [id number]

## 2011-02-13 NOTE — Cardiovascular Report (Signed)
NAMEROSALIO, CHARLET                 ACCOUNT NO.:  [id number]   MEDICAL RECORD NO.:  [id number]          PATIENT TYPE:  INP   LOCATION:  6532                         FACILITY:  [facility]   PHYSICIAN:  Mohan N. Terrence Dupont, M.D. DATE OF BIRTH:  1950/12/15   DATE OF PROCEDURE:  06/12/2007  DATE OF DISCHARGE:                            CARDIAC CATHETERIZATION   PROCEDURE:  1. Successful percutaneous transluminal coronary angioplasty to      proximal right coronary artery using initially 2 x 8-mm-long      Voyager balloon and then 3 x 8-mm-long Voyager balloon.  2. Successful deployment of 3.5 x 16-mm-long Taxus drug-eluting stent      in proximal right coronary artery.   INDICATIONS FOR PROCEDURE:  Mr. Plate is a 60 year old black male with  past medical history significant for coronary artery disease, history of  questionable MI in the past, hypertension, history of congestive heart  failure, diabetes mellitus, COPD, benign hypertrophy of prostate,  degenerative joint disease, morbid obesity, history of gouty arthritis,  history of chronic renal insufficiency, who was admitted on June 09, 2007 because of progressive increasing exertional dyspnea with minimal  exertion associated with feeling tired, fatigued, and no energy.  Denies  any chest pain, nausea, vomiting or diaphoresis.  Denies PND, orthopnea  or leg swelling.  The patient had Persantine Myoview on July 28 which  showed fixed defect in the anterior and inferior apical segment with no  evidence of ischemia with EF of 37%.  Due to multiple risk factors,  questionable history of MI, depressed LV systolic function, exertional  dyspnea, probably anginal equivalent, and multiple risk factors, the  patient underwent left cardiac cath on September 9 and was noted to have  70% to 75% proximal RCA stenosis.  The patient is brought to the cath  lab again been today for elective PCI to RCA.   PROCEDURE:  After obtaining the informed  consent, the patient was  brought to the cath lab and was placed on fluoroscopy table.  Right  groin was prepped and draped in usual fashion.  Xylocaine 2% was used  for local anesthesia in the right groin.  With the help of a thin-wall  needle, a 6-French arterial sheath was placed.  The sheath was aspirated  and flushed.  Next, a 6-French hockey-stick guiding catheter was  advanced over the wire under fluoroscopic guidance up to the ascending  aorta.  Wire was pulled out.  The catheter was aspirated and connected  to the manifold.  Catheter was further advanced and engaged into right  coronary ostium.  A single view of right coronary artery was obtained.   FINDINGS:  RCA showed proximal 70% to 75% stenosis.  The vessel is very  tortuous beyond its proximal portion.   INTERVENTIONAL PROCEDURE:  Successful PTCA to proximal RCA was done  using initially 2 x 8-mm-long Voyager balloon and then 3 x 8-mm-long  Voyager balloon for predilatation and then 3.5 x 16-mm-long Taxus drug-  eluting stent was deployed at 10 atmospheric pressure in proximal RCA  using buddy wire technique, as the  proximal vessel was very tortuous.  The stent was deployed at 10 atmospheric pressure, which was fully  expanded, going up to 16 atmospheric pressure.  Lesion was dilated from  75% to 0% residual with excellent TIMI grade 3 distal flow  without evidence of dissection or distal embolization.  The patient  received weight-based Angiomax and 300 mg of Plavix during the  procedure.  The patient was continued on bicarb drip during the  procedure.  The patient tolerated the procedure well.  There were no  complications.  The patient was transferred to recovery room in stable  condition.      Allegra Lai. Terrence Dupont, M.D.  Electronically Signed     MNH/MEDQ  D:  06/13/2007  T:  06/14/2007  Job:  [id number]   cc:   Dr. Mackie Pai O. Spruill, M.D.  [facility]

## 2011-02-13 NOTE — Assessment & Plan Note (Signed)
[place]                         ELECTROPHYSIOLOGY OFFICE NOTE   NAME:[name], [name]                        MRN:          [id number]  DATE:12/08/2007                            DOB:          July 25, 1951    HISTORY OF PRESENT ILLNESS:  [name] is referred today by Dr. Wallene Huh for evaluation and consideration for prophylactic ICD  implantation.   HISTORY OF PRESENT ILLNESS:  The patient is a very pleasant middle-aged  man (37) who has a longstanding history of diabetes and hypertension and  obesity.  He had a 2-D echo back in 2007 which demonstrated normal LV  systolic function.  The patient was hospitalized back in September.  I  do not have all his records from that time, but apparently he underwent  catheterization and stenting to a 70% LAD stenosis.  The patient states  that since then he has had no improvement in his heart failure symptoms  and has very severe shortness of breath and fatigue.  Most recently, he  underwent stress Myoview scanning in the hospital which demonstrated a  large anterior and anterior apical scar with an EF that was calculated  at 22%.  He is here for additional evaluation, consideration for  prophylactic ICD implantation.  The patient has never had syncope.  He  does not have angina.  His heart failure is fairly severe at least class  II if not more likely class III.   CURRENT MEDICATIONS:  1. Zantac 300 a day.  2. Colchicine 0.6 daily.  3. Allopurinol 100 a day.  4. Avodart 0.5 daily.  5. NovoLog and Lantus insulin as directed.  6. Aspirin 81 a day.  7. Pravachol 80 a day.  8. Avapro 150 a day.  9. Coreg 12.5 mg twice daily.  10.Crestor 20 mg daily.  11.Hytrin 10 mg daily.  12.Nitroglycerin patch.   FAMILY HISTORY:  Notable for father who is 23 on dialysis, also has  heart problems.  His mother 22 and has arthritis.   SOCIAL HISTORY:  The patient lives in [place].  He has been disabled  for  several years.  He denies tobacco or alcohol abuse.   PAST MEDICAL AND SURGICAL HISTORY:  1. Notable for hypertension and diabetes.  2. He also had multiple spinal surgeries in the past with spinal      fusions.  3. He has a history of esophageal stenosis with dilatation in the      past.   REVIEW OF SYSTEMS:  As noted in the HPI.  Also, the patient has a  history of sleep apnea and is on CPAP.  He notes that he is fairly stiff  and slow to get around, particularly in the mornings.  The patient does  occasionally have headaches.  There were prominent findings on review of  systems or as noted in the HPI.  He has PND and orthopnea, and he has  very severe exercise limitations from shortness of breath.   PHYSICAL EXAMINATION:  GENERAL:  He is a pleasant, middle-aged, but  chronically ill appearing man in no acute  distress.  VITAL SIGNS:  Blood pressure was 122/82 today.  The pulse 72 and  regular, respirations were 18.  Weight was 280 pounds.  HEENT:  Normocephalic and atraumatic.  Pupils equal are round.  Oropharynx moist.  Sclerae anicteric.  NECK:  Revealed 8-9 cm jugular distention.  There are no thyromegaly.  Trachea was midline.  Carotids 2+ and symmetric.  She noted there was 8-  9 cm jugular distention.  CARDIOVASCULAR:  Regular rhythm with normal S1-S2.  There is soft S3  gallop present.  The PMI was enlarged and laterally displaced.  There is  soft systolic murmur at the right upper sternal border.  ABDOMEN:  Obese, nontender, nondistended.  No organomegaly.  Bowel  sounds are present.  No rebound or guarding noted.  The extremities  demonstrated no cyanosis, clubbing or edema.  Pulses were 2+ and  symmetric.  NEUROLOGIC:  Alert and oriented x3 with cranial nerves intact.  Strength  5/5 and symmetric.   STUDIES:  EKG demonstrates sinus rhythm with nonspecific T-wave  abnormality and a QRS duration of 108 milliseconds.  Today we walked in  the hall on pulse oximetry to get  a better feel for his an exercise  limitation.  At baseline, the patient's heart rate was 95 beats per  minute with an oxygen saturation of 95%.  Walking at a very slow rate  for approximately 300 feet resulted in the patient's heart rate going up  to 150 beats per minute.  The oxygen saturation decreased into the lower  90s, 93-94%.  During walking, the patient had stopped for rest because  he could not continue.  He eventually made it completely around our  office at a distance of approximately 100 yards.   IMPRESSION:  1. Functional class III congestive heart failure, acute on chronic      systolic dysfunction.  2. Ischemic cardiomyopathy with probable out of hospital myocardial      infarction with residual 70% stenosis status post stenting of this      vessel  3. Hypertension.  4. Chronic renal insufficiency.  5. Diabetes longstanding   DISCUSSION:  The patient unfortunately has a very difficult problem.  His heart failure is fairly advanced, at least class III, and the  patient's wife notes that he sometimes has reduction in his blood  pressure, sometimes it is elevated, making his blood pressure fairly  labile.  Having said this, I think we need to try to up titrate his beta  blocker therapy.  With regards to his defibrillator,  it is clear indication for prophylactic ICD insertion, and I have  recommended proceeding with this.  He is considering his options and  will call us if he would like to proceed with ICD implant.     Champ Mungo. Lovena Le, MD  Electronically Signed    GWT/MedQ  DD: 12/08/2007  DT: 12/09/2007  Job #: [id number]   cc:   Ardyth Gal. Spruill, M.D.

## 2011-02-13 NOTE — Procedures (Signed)
CEPHALIC VEIN MAPPING   INDICATION:  Preop exam for AV fistula placement.   HISTORY:  Renal disease.   EXAM:   The right cephalic vein is not compressible at the antecubital fossa to  mid forearm levels.   Diameter measurements range from 0.18 to 0.42 cm.   The left cephalic vein is compressible.   Diameter measurements range from 0.19 to 0.39 cm.   The proximal brachium portions of the bilateral cephalic veins  demonstrate numerous small branchings with small diameters.   See attached worksheet for all measurements.   IMPRESSION:  1. Patent bilateral cephalic veins with chronic thrombus noted in the      right cephalic vein, as described above.  2. Measurements are noted on the attached work sheet.   ___________________________________________  Judeth Cornfield. Scot Dock, M.D.   CH/MEDQ  D:  06/23/2008  T:  06/23/2008  Job:  [id number]

## 2011-02-13 NOTE — Op Note (Signed)
[name], [name]                 ACCOUNT NO.:  [id number]   MEDICAL RECORD NO.:  [id number]          PATIENT TYPE:  INP   LOCATION:  3004                         FACILITY:  [facility]   PHYSICIAN:  Cooper Render. Pool, M.D.    DATE OF BIRTH:  September 01, 1951   DATE OF PROCEDURE:  11/03/2007  DATE OF DISCHARGE:                               OPERATIVE REPORT   PREOPERATIVE DIAGNOSIS:  Cervical stenosis with myelopathy.   POSTOPERATIVE DIAGNOSIS:  Cervical stenosis with myelopathy.   PROCEDURE:  C3-4, C4-5 and C5-6 anterior cervical diskectomy and fusion  with allograft and plating.   SURGEON:  Cooper Render. Pool, M.D.   ASSISTANT:  Kary Kos, M.D.   ANESTHESIA:  General endotracheal.   INDICATIONS FOR PROCEDURE:  [name] is a 60 year old male with  multiple medical problems who presents with worsening neck pain with  left upper extremity pain, paresthesias, and weakness.  The patient has  near complete loss of abduction of his left arm.  He has weakness  involving his left biceps.  He has grip weakness on the left side as  well.  He has some early lower extremity dysfunction.  Workup  demonstrates evidence of marked cervical stenosis at C3-4, C4-5 and C5-  6.  The patient is felt to be progressively worsening.  Although he has  symptoms of significant coronary artery disease status post previous  stenting, it is felt that his declining neurological function warrants  moving forward with this surgery.  The patient is aware of the risks and  benefits and wishes to proceed.   DESCRIPTION OF PROCEDURE:  The patient was taken to the operating room  and placed on the operating table in a supine position.  After an  adequate level of anesthesia had been achieved, the patient was placed  supine with the neck slightly extended with halter traction.  The  patient's anterior cervical region was prepped and draped sterilely.   A 10 blade was used to make a linear skin incision overlying the C4-5  vertebral level.  This was carried down sharply to the platysma.  The  platysma was then divided vertically, and the dissection proceeded along  the medial border of the sternomastoid muscle and carotid sheath.  The  trachea and esophagus were mobilized and tracked towards the left.  The  prevertebral fascia was stripped off the anterior spinal column.  The  longus colli muscle was then elevated bilaterally using electrocautery.  Deep self-retaining retractors were placed.  Intraoperative fluoroscopy  was used, and the levels were confirmed.  The disk spaces at C3-4, C4-5  and C5-6 were then incised with a 15 blade in rectangular fashion.  The  anterior osteophytes were removed using Leksell rongeurs and Kerrison  rongeurs.  A wide diskectomy was then performed at all three levels  using pituitary rongeurs, forward and backward Carlen curettes, Kerrison  rongeurs, high-speed drill.  All elements of the disks were removed down  to the level of the posterior annulus.  Starting first at C3-4 using the  microscope, the remaining aspects of annulus and osteophytes were  removed down to the level of the posterior longitudinal ligament using a  high-speed drill.  The posterior longitudinal ligament was then elevated  and resected in piecemeal fashion using Kerrison rongeurs.  A wide  central decompression was then performed by undercutting the bodies of  C3 and C4 using Kerrison rongeurs.  Decompression __________  each  neural foramen.  Wide anterior foraminotomy was then performed along the  course of the exiting C4 nerve roots bilaterally.  At this point, a very  thorough decompression had been achieved.  There was no injury to the  thecal sac or nerve roots.  The wound was then irrigated with antibiotic  solution.  Gelfoam was placed topically.  The procedure was then  repeated at C4-5 and C5-6, again without complication.  The wound was  irrigated one final time.  LifeNet allograft wedges  were then placed at  all three levels.  6-mm grafts were used at C3-4 and C5-6, 7-mm grafts  were used at C4-5.  All grafts were impacted and recessed approximately  1 mm from the anterior cortical margin.  A 62-mm Atlantis anterior  cervical plate was then placed over the C3, C4, C5 and C6 levels.  This  was then attached under fluoroscopic guidance using 13-mm variable angle  screws, two each at all four levels.  Locking screws engaged at all  levels.  Final images revealed good position of bone grafts __________  proper level__________ spine was irrigated with antibiotic solution.  Gelfoam was placed topically for hemostasis.  The wound was then closed  in typical fashion.  Steri-Strips and sterile dressings were applied.  There were no operative complications.   The patient tolerated the procedure well and he returns to the recovery  room postoperatively.           ______________________________  Cooper Render Pool, M.D.     HAP/MEDQ  D:  11/03/2007  T:  11/03/2007  Job:  [id number]

## 2011-02-13 NOTE — Assessment & Plan Note (Signed)
OFFICE VISIT   [name], [name]  DOB:  March 29, 1951                                       05/26/2010  [id number]   This is a 60 year old male who now presents 1 week status post  evacuation of hematoma from his right upper arm.  Approximately 9 days  prior to presentation, he had undergone thrombectomy with a revision of  his venous outflow tract.  He had a brachiobrachial forearm loop graft.  At his evacuation of hematoma approximately 1 week ago, there was some  concern for cellulitis.  At this point, the patient has no fever or  chills and no further redness and no drainage noted at either incisions  made in the previous cases. He does note at this point continued  sensation of some numbness in his first through third digits.  He notes  this is a continuous sensation with some slight improvement. Denies any  paresthesias per se and notes no weakness in the hand with hand grip.  At this point, he is continuing to dialyze through a right internal  jugular tunneled dialysis catheter without any difficulties and any  presents today for follow-up, reevaluation and consideration for a new  graft placement.   On examination he was afebrile and his vitals were stable.  The right  arm demonstrates no signs of cellulitis throughout the arm.  Both  incisions are healing well.  The  forearm incision is healed at this  point and the running suture that was placed here can be removed.  Due  to its previous drainage in the upper arm, I would like to maintain  sutures in place for an additional week.  There is a small amount of  ballottability in the upper arm surgical site but greatly improved with  no signs of tension and as previously, no drainage is noted at this site  either.  On neuro testing, he has full 5/5 motor strength in intrinsic  and extrinsic hand muscles.  Muscle strength was intact in the right  hand line.  On pinpoint testing, there is some  decreased pinpoint on the  dorsal and volar surfaces of the second digit and on the volar surface  of the thumb; however,  light touch remains intact throughout.  The  remainder of the fingers have both light touch and pinpoint sensation  intact.   IMPRESSION:  This is a 60 year old male who has undergone 2 recent  dialysis access procedures with some residual neurologic his symptoms  likely related to his previous procedures which are improving on  objective testing, despite his continued symptomatology .  He only has  reproducible reduced pinpoint discrimination in the first and second  fingers.  I counseled the patient that this will likely resolve with  time.  He at this point would like to be considered for placement of  permanent access and I think this is appropriate given that there are no  residual signs of any cellulitis and bacteremia has never presented in  this patient.  At this point we will obtain bilateral upper arm vein  mapping to determine what his next options are in terms of access  surgery and he would like to be scheduled within the next week if it is  possible.   ADDENDUM  Based on the vein mapping which I reviewed today.  His  left arm has  disadvantaged veins with only a basilic vein for consideration.  Unfortunately, toward the antecubitum, the basilic vein attenuates with  median antecubital vein present which might be acceptable as outflow for  a staged basilic vein transposition.  I had an extensive discussion with  the patient in regards to risks, benefits, and alternatives.  Specifically, I discussed with him the 10% risk of steal with a basilic  vein transposition and the need for an additional surgery if the vein  matures, allowing a transposition at that time.  He elects to proceed  with the procedure.  I explained to him, that routinely I also consent  for placement of arteriovenous graft on these procedures in the event  that the vein is not  useable.     Adele Barthel, MD  Electronically Signed   BC/MEDQ  D:  05/26/2010  T:  05/29/2010  Job:  [id number]

## 2011-02-13 NOTE — Assessment & Plan Note (Signed)
OFFICE VISIT   [name], [name]  DOB:  [birth year]                                       07/14/2010  [id number]   HISTORY OF PRESENT ILLNESS:  This is a 60 year old gentleman that  previously has undergone a revision of his right forearm graft and  evacuation of a hematoma who now presents with a history of earlier this  week of some redness around his right arm cannulation site.  No fevers  or chills.  No drainage from this site.  Additionally he notes concern  of pruritus consistent with a previous episode of septicemia in the  setting of choledocholithiasis that required ERCP x 4 by report.  The  patient appeared more concerned about this than his right arm.   On physical examination today, he had a temperature 98.1 blood pressure  154/88, heart rate of 86, respirations were 12.  On focused exam: on his  right arm, there is no erythema at any locations.  There is with some  altered skin that looks a reaction to adhesive of around his radial side  cannulation site.  On the ulnar side cannulation site, there is evidence  of healing needle sticks without any redness either.  There is no  tenderness at either location.  No drainage.  No fluid collection.  On  palpation, there is no ballotable fluid collection.  The thrill is an  intact throughout this graft.  Additionally on his left arm, there is a  palpable thrill and bruit in the basilic vein transposition.   MEDICAL DECISION MAKING:  This is a 60 year old gentleman that has now  undergone successful dialysis through a right forearm arteriovenous  graft that spontaneously opened up after what was thought to have been a  failed revision.  He managed to recannulate this arteriovenous graft and  there is no evidence on exam today that he has a graft infection.  As a  precaution, I am going to have his dialysis center draw a CBC and a  blood culture while he is on the dialysis to further interrogate  this  but at this point, I have a low clinical suspicion of a graft infection.   Also, we discussed briefly his previous episodes of choledocholithiasis.  It sounds like he underwent a laparoscopic cholecystectomy and ERCP with  sphincterotomy and possible biliary drain placement.  It sounds like he  had complications from that.  There is some possibility that he has some  recurrence of the biliary stenosis.  I informed him that I no longer  actively practice general surgery, so he needed to contact his primary  care physician so he could get in touch with his gastroenterologist and  then possibly get referred for lab draws to evaluate for his total  bilirubin level and then possible imaging of his biliary system.  At  this point, he is nontoxic and there may be some slight jaundice to his  sclerae but he does not have clear-cut evidence of biliary stenosis  based on just physical exam which is not possible anyway to determine  without additional radiologic studies, so I recommended that if he has  any change in mental status, develops fevers or chills, he needs to go  to the ER to be evaluated both laboratory wise and also radiologically.  He agreed with this plan.  He  will follow up with his primary care  physician and gastroenterologist.     Adele Barthel, MD  Electronically Signed   BC/MEDQ  D:  07/14/2010  T:  07/17/2010  Job:  [id number]

## 2011-02-13 NOTE — Op Note (Signed)
[name], [name]                 ACCOUNT NO.:  [id number]   MEDICAL RECORD NO.:  [id number]          PATIENT TYPE:  AMB   LOCATION:  SDS                          FACILITY:  [facility]   PHYSICIAN:  Judeth Cornfield. Scot Dock, M.D.DATE OF BIRTH:  [birth year]   DATE OF PROCEDURE:  07/09/2008  DATE OF DISCHARGE:  07/09/2008                               OPERATIVE REPORT   PREOPERATIVE DIAGNOSIS:  End-stage renal disease.   POSTOPERATIVE DIAGNOSIS:  End-stage renal disease.   PROCEDURE:  Placement of a new right forearm arteriovenous graft.   SURGEON:  Judeth Cornfield. Scot Dock, MD   ASSISTANT:  Jenne Pane, RNFA   ANESTHESIA:  General.   TECHNIQUE:  The patient was taken to the operating room and received  general anesthetic.  The right upper extremity was prepped and draped in  the usual sterile fashion.  A longitudinal incision was made up to the  antecubital level where the brachial artery and brachial vein were  dissected free.  Both were reasonable sized.  Using one distal  counterincision, a 4-7 mm graft was then tunneled in a looped fashion in  the forearm with the arterial aspect of the graft along the lateral  aspect of the forearm.  The patient was then heparinized.  The brachial  artery was clamped proximally and distally and a longitudinal  arteriotomy was made.  A segment of 4 mm into the graft was excised.  The graft spatulated and sewn end-to-side to the brachial artery using  continuous 6-0 Prolene suture.  The graft was then reported to be  appropriate length for anastomosis to the brachial vein.  The vein was  ligated distally and spatulated proximally.  It took a 5-mm dilator.  The graft was cut to the appropriate length, spatulated, and sewn end-to-  end to the vein using continuous 6-0 Prolene suture.  At the completion,  there was a good thrill in the graft and there was a palpable radial  pulse.  Hemostasis was obtained in the wounds.  Of note, there was some  lymphatic  drainage seeping through the graft.  I therefore used Gelfoam  and thrombin which did slow this down and after the closure I placed an  Ace bandage with some mild compression.  The wounds were both closed  with a deep layer of 3-0 Vicryl and the skin closed with 4-0 Vicryl.  Sterile dressing was applied.  The patient tolerated the procedure well  and was transferred to the recovery room in satisfactory condition.  All  needle and sponge counts were correct.      Judeth Cornfield. Scot Dock, M.D.  Electronically Signed     CSD/MEDQ  D:  07/09/2008  T:  07/10/2008  Job:  [id number]

## 2011-02-13 NOTE — Assessment & Plan Note (Signed)
[facility] OFFICE NOTE   NAME:GREENCorvin, [name]                        MRN:          [id number]  DATE:01/30/2008                            DOB:          [birth year]    [name] returns today for followup.  I saw him back in referral by Dr.  Wallene Huh, approximately 2 months ago.  He was sent over for  consideration for prophylactic ICD insertion.  The patient is very  pleasant, middle-aged man who has a history of severe coronary disease,  hypertension and COPD.  He also has a history of BPH and sleep apnea.  The patient's heart failure has continued to worsen.  He has chronic  renal insufficiency as well (questionable low output state ?).  His EF  is 23% with anterior hypokinesis.  The patient is referred today for  additional evaluation.  We discussed defibrillator implantation with him  when I saw him in the office, before but he at this point still does not  wish to proceed with this.  He states that his fatigue and weakness are  so severe that he does not have much quality of life.  His heart failure  is at least class III.   CURRENT MEDICATIONS:  Include:  1. Imdur 60 mg a day.  2. Pravachol 80 a day.  3. Aspirin 81 mg a day.  4. Lantus and NovoLog insulin.  5. Allopurinol 100 a day.  6. Colchicine and Zantac 300 daily.  7. He is also on Avapro 150 a day.  8. Coreg 12.5 twice daily.  9. Sublingual nitroglycerin.   EXAMINATION:  GENERAL:  On exam, he is a pleasant middle-aged obese man  in no acute distress.  VITAL SIGNS:  Blood pressure was 118/70.  The pulse was 90 and regular,  respirations were 18.  Weight was 281 pounds.  NECK:  Revealed 8 cm of jugular distention.  There was no thyromegaly.  Trachea was midline.  The carotids 2+ and symmetric.  LUNGS:  Clear  bilaterally to auscultation.  The breath sounds were somewhat decreased.  I did not appreciate any rhonchi or rales or wheezes.  CARDIOVASCULAR:  Exam was distant with a regular rate and rhythm with  normal S1 and S2.  The PMI was enlarged and laterally displaced.  ABDOMEN:  Soft, nontender, nondistended.  There was no organomegaly.  The bowel sounds were present.  There was no rebound or guarding.  EXTREMITIES:  Demonstrated 2+ edema.  There was no cyanosis or clubbing.   IMPRESSION:  1. Ischemic cardiomyopathy with class III heart failure.  2. Diabetes.  3. Musculoskeletal problems with arthritis.  4. Problems with volume overload.  5. Chronic renal insufficiency.   DISCUSSION:  [name] has a difficult situation, in that he has severe  LV dysfunction, chronic renal insufficiency, diabetes and a host of  other medical problems.  He tended to be volume overload loaded, worsens  his heart failure symptoms.  Because his heart failure is so severe and  because he has no real device therapy for him at this time  with regard  to improving his symptoms, I will ask him to see Dr. [name] in our  group for additional recommendations for improvement in his heart  failure symptoms.  We will see the patient back in the office, pending  the results of his consultation with Dr. [name].     Champ Mungo. Lovena Le, MD  Electronically Signed    GWT/MedQ  DD: 01/30/2008  DT: 01/30/2008  Job #: [id number]   cc:   Ardyth Gal. Spruill, M.D.

## 2011-02-13 NOTE — Consult Note (Signed)
NAMENICHOLA, [name]                 ACCOUNT NO.:  [id number]   MEDICAL RECORD NO.:  [id number]          PATIENT TYPE:  INP   LOCATION:  4741                         FACILITY:  [facility]   PHYSICIAN:  Shaune Pascal. Champey, M.D.DATE OF BIRTH:  02-08-1951   DATE OF CONSULTATION:  DATE OF DISCHARGE:                                 CONSULTATION   REFERRING PHYSICIAN:  Valerie A. Asa Lente, MD   HISTORY OF PRESENT ILLNESS:  [name] is a 60 year old African-American  male with multiple medical problems who presents with a 5-day history of  vertigo.  The patient woke up last Wednesday with dizziness and vertigo.  He also has been complaining of right frontal headache and left upper  extremity weakness.  He does deny any nausea, palpitations with his  symptoms.  His symptoms usually are triggered by quick sudden movements  and he denies any visual changes, speech changes, numbness, swelling,  problems chewing, chewing problems, falls or loss of consciousness.   PAST MEDICAL HISTORY:  1. Positive for CAD.  2. COPD.  3. CHF/CM.  4. Hypertension.  5. Diabetes.  6. High cholesterol.  7. Bipolar.  8. Anemia.  9. Gout.  10.BPH.  11.OSA.  12.Reflux.   CURRENT MEDICATIONS:  1. Aspirin.  2. Plavix.  3. Isosorbide.  4. Allopurinol.  5. Colchicine.  6. Coreg.  7. Hytrin.  8. Lantus.  9. NovoLog.  10.Avapro.  11.Lisinopril.  12.Meclizine p.r.n.   ALLERGIES:  CEPHALOSPORINS.   FAMILY HISTORY:  Positive of seizures in the son, CAD, hypertension,  diabetes.   SOCIAL HISTORY:  The patient lives with his wife and son.  Currently is  on disability.  Quit tobacco 1 year ago.  Denies any alcohol or drug  use.   REVIEW OF SYSTEMS:  Positive as per HPI.  Negative as per HPI for any  other organ systems.   PHYSICAL EXAMINATION:  VITAL SIGNS:  Temperature 98.2, pulse 83,  respirations 22, blood pressure 109/61, O2 saturation 99% on 2 liters.  HEENT:  Normocephalic, atraumatic.  Extraocular  muscles intact.  Face is  symmetrical.  NECK:  Supple.  HEART:  Regular.  LUNGS:  Clear.  ABDOMEN:  Soft.  EXTREMITIES:  Show good pulses.  NEUROLOGIC:  The patient is alert, awake and oriented.  Lying with  discomfort. The patient is following commands appropriately.  Cranial  nerves II-XII are grossly intact.  Motor examination:  The patient has  subtle weakness, 4 to 4+ out of 5 strength in the left proximal upper  extremity.  The rest of the strength is 4+ to 5 out of 5 strength.  Sensory examination is within normal limits.  Light touch, reflux are  trace throughout.  Cerebellar function is within normal limits.  Finger-  to-nose, gait, the patient does have a positive Romberg sign noticed.   IMAGING:  MRI and EEG are currently pending.   LABORATORY DATA:  WBC 6.3, hemoglobin 10.4, hematocrit 31.5, platelets  276, CK is 480, CK-MB is 6.1, troponin 0.04.  Sodium 137, potassium 4.6,  chloride 107, CO2 22, BUN 49, creatinine 2.68, glucose  123, calcium 8.3.   IMPRESSION:  This 60 year old African-American male with 5-day history  of vertigo, left-sided weakness, headache.  Agree with getting an MRI of  the brain.  We will add an MRA of the brain for his dizziness to  evaluate posterior circulation compromise.  We will check an EEG also.  We will continue the meclizine. Consider further workup with a 2D  echocardiogram, carotid Dopplers, lipids, homocystine levels pending on  results of MRI scan.  We will get physical therapy, occupational therapy  consults as well.  We will follow the patient.      Shaune Pascal. Estella Husk, M.D.  Electronically Signed     DRC/MEDQ  D:  07/21/2007  T:  07/22/2007  Job:  [id number]

## 2011-02-13 NOTE — Procedures (Signed)
EEG NUMBER:  [id number].   DATE OF STUDY:  July 23, 2007.   ORDERED BY:  1. Marletta Lor, MD   REASON FOR STUDY:  This is a 60 year old male with lightheadedness,  weakness and possible vertigo.   MEDICATIONS:  1. Heparin.  2. Plavix.  3. Zyloprim.  4. Coreg.  5. Hytrin.  6. Insulin.  7. Avapro.  8. Prinivil.  9. Meclizine.  10.Valium.  11.Reglan.  12.Nitroglycerin.   PROCEDURE:  This is a routine 17-channel EEG with 1 channel devoted to  EKG utilizing international 10/20 lead placement system.  The patient  was described clinically as being awake and drowsy.  He appeared to be  awake throughout the recording electrographically. Background consists  of a well-organized well-developed, well-modulated 10 Hz alpha activity  which is predominant in the posterior head region and reactive to eye  opening.  No interhemispheric asymmetries identified and no definite  epileptiform discharges were seen.  Activation procedures included  photic stimulation which did not produce any significant change in  background activity.  Hyperventilation was not performed.  The EKG  monitor reveals relatively a regular rhythm with a rate of 84 beats per  minute.   CONCLUSION:  Normal EEG in a waking state without seizure activity or  focal abnormality.  Clinical correlation is recommended.      Catherine A. Jacolyn Reedy, M.D.  Electronically Signed     [id number]  D:  07/23/2007 15:01:30  T:  07/24/2007 11:50:20  Job #:  [id number]

## 2011-02-13 NOTE — Procedures (Signed)
CEPHALIC VEIN MAPPING   INDICATION:  Preop evaluation.   HISTORY:   EXAM:  The right cephalic vein is not compressible at the proximal forearm  level where there is chronically occlusive thrombus.   Diameter measurements range from 0.26  to 0.49 cm.   The right basilic vein is compressible with diameter measurements  ranging from 0.41 to 0.59 cm.   The left cephalic vein is compressible with diameter measurements  ranging from 0.21 to 0.45 cm.   The left basilic vein is compressible with diameter measurements ranging  from 0.19 to 0.4 cm.   See attached worksheet for all measurements.   IMPRESSION:  Patent bilateral cephalic and basilic veins with diameter  measurements as described above.   ___________________________________________  Adele Barthel, MD   CH/MEDQ  D:  05/29/2010  T:  05/29/2010  Job:  [id number]

## 2011-02-13 NOTE — Assessment & Plan Note (Signed)
[facility]                            CARDIOLOGY OFFICE NOTE   NAME:GREENAshwath, Chau                        MRN:          [id number]  DATE:03/09/2008                            DOB:          1951/07/11    REFERRING PHYSICIAN:  Champ Mungo. Lovena Le, MD   CARDIOLOGIST:  Dr. Wallene Huh and Dr. Charolette Forward.   REASON FOR CONSULTATION:  Is congestive heart failure.   HISTORY OF PRESENT ILLNESS:  Mr. Piehler is a very pleasant 60 year old  male with multiple medical problems including morbid obesity, diabetes,  myositis, COPD on nighttime oxygen, sleep apnea on CPAP, chronic renal  insufficiency with a creatinine between 2.5 and 3.  He also has coronary  artery disease.   In reviewing his records, he had an echocardiogram back in 2007 which  showed a normal ejection fraction with an EF of 55%.  The RV was mildly  dilated.  He then followed up with a Myoview in July of 2009 which  showed a fixed defect in the inferior apical walls with an EF of 37%, so  he underwent cardiac catheterization by Dr. Terrence Dupont.  Apparently, this  showed essentially normal left-sided system, 70-75% stenosis in his  proximal RCA, and he underwent PTCA and stenting with Taxus drug-eluting  stent.   Since that time, he has continued to feel very fatigued.  He has not  seen any improvement in his exercise capacity.  He says he can walk less  than a block before he just gets fatigued.  He has absolutely no energy.  Denies any chest pain, swelling, orthopnea or PND.  Does get some  chronic dyspnea.  He quit smoking about that 2 years ago.  He did have  his CPAP titrated several months ago, and this has not helped as well.   He was eventually referred to Dr. Lovena Le for possible ICD placement.  Mr. Volesky refused ICD as he said if it is not going to make him feel any  better he is just not interested.  He was referred here for further  evaluation of his heart failure.   REVIEW OF  SYSTEMS:  As per HPI and problem list.  It is also notable for  muscle aches secondary to his myositis.  He also has BPH and arthritis.  Remainder of the review of systems is negative except for HPI and  problem list.   PROBLEM LIST:  1. Coronary artery disease. Status post cardiac catheterization in      September 2008 which showed single-vessel disease.  He is status      post stenting of his RCA with a Taxus drug-eluting stent.  2. Congestive heart failure.      a.     Echocardiogram in 2007 showed an ejection fraction of 55%.      b.     Ejection fraction 37% by Myoview in July 2009.  3. Morbid obesity.  4. Chronic obstructive pulmonary disease on nighttime O2.  5. Obstructive sleep apnea on CPAP.  6. Diabetes.  7. Chronic renal insufficiency, baseline creatinine 2.5-3.  8. Hypertension.  9. Hyperlipidemia.  10.History of myositis requiring discontinuation of his statin.  11.Gout.  12.Benign prostatic hypertrophy.   CURRENT MEDICATIONS:  1. Imdur 60 a day.  2. Novolin insulin.  3. Plavix 75 a day.  4. Avapro 150 a day.  5. Coreg 25 b.i.d.  6. Lasix 40 a day.  7. Hytrin 10 mg a day.  8. Crestor 20 mg a day which is on hold.  9. Zantac 300 a day.  10.Colchicine 0.6 a day.  11.Allopurinol 100 a day.  12.Avodart 0.5 a day.  13.Aspirin 81 a day.   SOCIAL HISTORY:  Is married with 3 children.  He smoked cigarettes up  until 2 years ago.  Does not drink alcohol.   FAMILY HISTORY:  Both mother and father are alive.  There is a family  history of coronary artery disease.   PHYSICAL EXAMINATION:  He is in no acute distress.  He ambulates around  the clinic without any significant respiratory difficulty.  Blood  pressure is 126/78, heart rate 86, weights 284.  HEENT:  Is normal.  NECK:  Is supple.  There is no obvious JVD, but it is hard to tell due  to his neck being so thick.  The carotids are 2+ bilaterally with no  bruits.  There is no lymphadenopathy or thyromegaly.   CARDIAC:  PMI is nonpalpable.  He is regular with an S4.  No obvious  murmurs.  LUNGS:  Clear with decreased air movement throughout.  ABDOMEN:  Is morbidly obese with marked central obesity.  Unable to  appreciate hepatosplenomegaly.  No bruits, no masses.  EXTREMITIES:  Warm.  There is no cyanosis, clubbing or edema.  No rash.  NEUROLOGICAL:  He is alert and oriented x3.  Affect is mildly flattened.  Cranial nerves II-XII are intact.  Moves all 4 extremities without  difficulty.   EKG shows normal sinus rhythm.  He has mild T-wave inversions in V6.  There is nonspecific ST-T wave depression inferiorly.   ASSESSMENT/PLAN:  1. Dyspnea and markedly reduced exercise tolerance.  At this point, it      is not exactly clear to me what is wrong with Mr. Diguglielmo.  We have      disparate information regarding his left ventricular function.  At      this point, I think the first order of business is to clearly      define his ejection fraction.  We will consider an MRI to look for      ejection fraction and any possible scar.  However, his size and      renal insufficiency really make this suboptimal.  We are unable to      give him contrast.  We will start an echocardiogram and see what we      can find out.  We will also proceed with a cardiopulmonary exercise      test to further evaluate and see if we can sort out the reasons for      his exercise intolerance.  I am very suspicious that there is a      significant component here of deconditioning, obesity, and COPD      planning a role.  Overall, I think his volume status looks good.      He is on excellent medications.  I would not make a change to this      right now.  If it does look like his heart failure is playing a  significant role, he may benefit from a right heart catheterization      to evaluate his right-sided pressures to rule out pulmonary      hypertension and also his cardiac output.  2. Coronary artery disease.  This is  mild.  He is status post      stenting.  Even if he does have left ventricular dysfunction, this      is far out of proportion to his coronary artery disease.  Continue      current therapy.   DISPOSITION:  Pending the results of his echocardiogram and CPX test.     Shaune Pascal. Bensimhon, MD  Electronically Signed    DRB/MedQ  DD: 03/09/2008  DT: 03/09/2008  Job #: [id number]   cc:   Ardyth Gal. Spruill, M.D.  Allegra Lai. Terrence Dupont, M.D.

## 2011-02-13 NOTE — Discharge Summary (Signed)
[name], [name]                 ACCOUNT NO.:  [id number]   MEDICAL RECORD NO.:  [id number]          PATIENT TYPE:  INP   LOCATION:  3035                         FACILITY:  [facility]   PHYSICIAN:  Cooper Render. Pool, M.D.    DATE OF BIRTH:  10-01-1951   DATE OF ADMISSION:  10/30/2007  DATE OF DISCHARGE:  11/10/2007                               DISCHARGE SUMMARY   SERVICE:  Neurosurgery.   FINAL DIAGNOSES:  1. Cervical stenosis with myelopathy.  2. Severe coronary artery disease, status post coronary artery      stenting.  3. History of congestive heart failure.  4. Diabetes mellitus.  5. Chronic renal insufficiency.  6. Hypertension.   HISTORY OF PRESENT ILLNESS:  [name] is a 60 year old male who  presents with neck pain and worsening left upper extremity weakness,  paresthesias and numbness consistent with a myeloradiculopathy.  The  patient has lost almost all abduction of his left shoulder and flexion  of his left arm.  His distal strength is diminished, but relatively  spared on the left side.  Right upper extremity strength is full.  The  lower extremity strength is full, although the patient is having some  gait instability.  Workup demonstrates evidence of severe cervical  stenosis at C3-4, C4-5 and C5-6 with spinal cord and nerve root  compression.  The patient is progressively losing neurological function  into his left upper extremity secondary to his spinal cord and nerve  root dysfunction.  I discussed the options with the patient, his wife  and his caregivers.  There is some trepidation with regard to proceeding  with surgery because of his recent coronary artery stenting and his need  for antiplatelet therapy with Plavix.  Both Dr. Montez Morita and myself feel  this is outweighed by his neurological decline.  I have discussed  situation with the family and they are willing to proceed with three-  level anterior cervical diskectomy and fusion for decompression of his  spinal cord and nerve roots in hopes of improving his symptoms.   HOSPITAL COURSE:  The patient was admitted to the hospital.  He was then  taken off his Plavix and started on IV Integrilin per the wishes of Dr.  Terrence Dupont.  The patient was observed prior to surgery 4 days later.  He  had no untoward events with this therapy.  The patient was then taken to  the operating room on Monday morning, where an uncomplicated three-level  anterior cervical decompression and fusion with instrumentation was  performed.  Postoperatively, the patient was kept intubated secondary to  airway concerns secondary to his obstructive sleep apnea and large neck.  The patient was extubated the next morning without incident.  He showed  no evidence of airway dysfunction or other problems.  The patient had no  evidence of cardiac event throughout his hospital stay.  His heart rate  has been stable.  He has had no chest pain or shortness of breath.  The  patient was noted to be significantly anemic, which is felt to be  secondary to his chronic  renal insufficiency.  He did receive an  intraoperative blood transfusion to improve his oxygen delivery to his  heart and other organs.  Postoperatively, the patient's creatinine level  increased from 2.8 to as high as 4.1.  At the time of discharge, the  creatinine level has stabilized at 3.5.  His urine output is strong.  His potassium level is acceptable, but trending towards the high side.  At the time of discharge, the patient is awake and alert.  He is  oriented and appropriate.  His cranial nerve function is intact.  His  left upper extremity function is stable.  He still has 1/5 to 2/5  strength in his left deltoid and biceps.  The remainder of his left  upper extremity strength is intact.  Lower extremity strength is normal.  His wound is clean, dry and intact.  It is healing well.  The patient  does complain of some dysphagia with swallowing, but this was rather   mild.  He has also complained of some foot pain during his  hospitalization.  X-rays are negative.  His clinical exam is consistent  with plantar fasciitis.  Uric acid levels were also checked and they are  negative for gout.   CONDITION AT DISCHARGE:  Stable.   DISPOSITION:  The patient will be discharged home.   FOLLOWUP:  He is to follow up with Dr. Montez Morita for evaluation and  monitoring of his chronic renal insufficiency and anemia of chronic  disease.  I will plan on seeing him back in 1 week.           ______________________________  Cooper Render. Pool, M.D.     HAP/MEDQ  D:  11/10/2007  T:  11/11/2007  Job:  [id number]   cc:   Ardyth Gal. Spruill, M.D.

## 2011-02-13 NOTE — Assessment & Plan Note (Signed)
OFFICE VISIT   JUANPABLO, ROSSMILLER  DOB:  1951-06-08                                       07/27/2008  [id number]   The patient had a new right forearm AV graft placed on 07/09/2008.  He  was recently in the hospital for depression and weakness, and before he  left it was noted that his incision had separated slightly.  I asked him  to come in today to have his wound checked.   EXAMINATION:  His graft in the right forearm has a good bruit and  thrill.  There is some slight separation of the incision.  However, the  tissue appears to have healed beneath this.  There is no erythema or  drainage.   I have simply instructed the wife to keep bacitracin on the wound and a  Band-Aid over this and I will see him back in 3 weeks.  She knows to  call sooner if he has problems.   Judeth Cornfield. Scot Dock, M.D.  Electronically Signed   CSD/MEDQ  D:  07/27/2008  T:  07/28/2008  Job:  [id number]

## 2011-02-13 NOTE — Assessment & Plan Note (Signed)
[facility]                            CARDIOLOGY OFFICE NOTE   NAME:GREENOsceola, Koe                        MRN:          [id number]  DATE:09/14/2008                            DOB:          Aug 11, 1951    PRIMARY CARE PHYSICIAN AND CARDIOLOGIST:  Ardyth Gal. Spruill, MD   ENDOCRINOLOGIST:  Jacelyn Pi Loanne Drilling, MD   RHEUMATOLOGIST:  Lindaann Slough, MD   NEPHROLOGIST:  Darrold Span. Florene Glen, MD   INTERVAL HISTORY:  [name] is 60 year old male with multiple medical  problems including morbid obesity, diabetes, myositis with a sed rate of  100, COPD on nighttime oxygen, sleep apnea on CPAP, end-stage renal  disease, pending dialysis, mild coronary artery disease, status post  Taxus drug-eluting stent to the right coronary in September 2008, and a  previous history of congestive heart failure secondary to ischemic  cardiomyopathy with an EF of 37%.  Most recent echocardiogram, however,  showed an EF of 55% with mild tricuspid regurgitation.  He has  previously had a cardiopulmonary exercise test, which showed a VO2 of  12.0 with a slope of 45.  When corrected to his ideal body weight, the  VO2 corrected to 18.0.   He returns today for routine followup.  He continues to feel very  fatigue.  He states he has a hard time getting out of bed and once he  does, he can hardly do anything without dyspnea.  He is very tired.  He  has recently had a left arm fistula put in place for possibility of  starting dialysis.  He has not had any orthopnea.  No PND.  He denies  any lower extremity edema or chest pain.   CURRENT MEDICATIONS:  1. Imdur 60 a day.  2. Insulin.  3. Plavix 75 a day.  4. Coreg 25 b.i.d.  5. Hytrin 10 a day.  6. Amlodipine 10 a day.  7. Lasix 80 t.i.d.  8. Celexa 10 a day.   PHYSICAL EXAMINATION:  GENERAL:  He is in no acute distress, he  ambulates around the clinic slowly without any respiratory difficulty.  VITAL SIGNS:  Blood pressure is  138/66, heart rate is 76, and weight is  270, which is down 15 pounds.  HEENT:  Normal.  NECK:  Supple and thick, unable to see JVD.  Carotids are 2+ bilaterally  without any bruits.  There is no lymphadenopathy or thyromegaly.  CARDIAC:  PMI is nonpalpable.  He is regular with an S4.  No obvious  murmurs.  LUNGS:  Clear.  ABDOMEN:  Morbidly obese, unable to appreciate any hepatosplenomegaly,  no bruits, no masses.  EXTREMITIES:  Warm with no cyanosis, clubbing, or edema.  SKIN:  No rash.  NEUROLOGIC:  Alert and oriented x3.  Cranial nerves II through XII  intact.  Moves all 4 extremities without difficulty.  Affect is  pleasant.   ASSESSMENT AND PLAN:  1. Dyspnea.  This is multifactorial, due in part to diastolic heart      failure as well as his morbid obesity, deconditioning, and chronic  obstructive pulmonary disease.  Currently, his volume status looks      pretty good.  I suspect that dialysis may help some, but I also      strongly suggested to him that he start very slow exercise program      with walking several minutes everyday and also watch his diet to      try and lose weight.  He is currently also off his ARB due to his      renal failure.  2. Hypertension, just mildly elevated, this is followed by Dr.      Montez Morita.   DISPOSITION:  Unfortunately at this time, I do not think we have much to  offer from a heart failure point of view.  I think he is on a good  regimen.  We will need to continue this.  I suspect he will continue to  have problems unless he takes some more active role on his health with  weight loss and more physical activity.  We will see him back in 9  months for followup.     Shaune Pascal. Bensimhon, MD  Electronically Signed    DRB/MedQ  DD: 09/14/2008  DT: 09/15/2008  Job #: [id number]   cc:   Ardyth Gal. Spruill, M.D.  Lindaann Slough, M.D.  Darrold Span. Florene Glen, M.D.  Sean A. Loanne Drilling, MD

## 2011-02-13 NOTE — Discharge Summary (Signed)
[name], [name]                 ACCOUNT NO.:  [id number]   MEDICAL RECORD NO.:  [id number]          PATIENT TYPE:  INP   LOCATION:  2027                         FACILITY:  [facility]   PHYSICIAN:  Allegra Lai. Terrence Dupont, M.D. DATE OF BIRTH:  Jul 09, 1951   DATE OF ADMISSION:  06/09/2007  DATE OF DISCHARGE:  06/16/2007                               DISCHARGE SUMMARY   ADMITTING DIAGNOSES:  1. Exertional dyspnea.  2. Probable angina equivalent rule out coronary insufficiency.  3. Coronary artery disease.  4. History of questionable myocardial infarction in the past.  5. Dilated cardiomyopathy.  6. History of congestive heart failure.  7. Hypertension.  8. Insulin requiring diabetes mellitus.  9. Hypercholesterolemia.  10.Gouty arthritis.  11.Benign prostatic hypertrophy.  12.History of tobacco abuse.  13.History of bipolar disease.  14.Chronic kidney disease.  15.Chronic obstructive pulmonary disease.  16.Obesity.  17.Hypoventilation syndrome.  18.Obstructive sleep apnea.   FINAL DIAGNOSES:  1. New onset angina.  2. Multivessel coronary artery disease status post percutaneous      transluminal coronary angioplasty and stenting to the proximal      right coronary artery.  3. History of congestive heart failure.  4. Ischemic cardiomyopathy.  5. Hypertension.  6. Insulin requiring diabetes mellitus.  7. Hypercholesterolemia.  8. Sick sinus syndrome.  9. History of tobacco abuse.  10.History of bipolar disease.  11.Chronic renal insufficiency.  12.Anemia.  13.Gouty arthritis.  14.Benign prostatic hypertrophy.  15.Chronic obstructive pulmonary disease.  16.Obstructive sleep apnea.  17.Obesity.  18.Hypoventilation syndrome.   DISCHARGE HOME MEDICATIONS:  1. Enteric coated aspirin 325 mg 1 tablet daily.  2. Plavix 75 mg 1 tablet daily with food.  3. Chloride 6.25 mg 1 tablet every 12 hours.  4. Altace 5 mg 1 capsule daily.  5. Avapro 150 mg 1 tablet daily.  6. Imdur 60 mg 1  tablet daily.  7. Lipitor 20 mg 1 tablet daily.  8. Ranitidine 300 mg 1 tablet daily as before.  9. Allopurinol 100 mg 1 tablet daily.  10.Nitrostat 0.4 mg sublingual use as directed.  11.Hytrin 5 mg 1 tablet daily at night.  12.Lantus insulin as before.  13.NovoLog insulin sliding scale as before.  14.CPAP at home as before.   DIET:  Low salt, low cholesterol 1800 calorie ADA diet.  The patient has  been advised to reduce weight.   ACTIVITY:  Increase activity slowly as tolerated.   DISCHARGE INSTRUCTIONS:  Post PTCA and stent instructions have been  given.  The patient has been advised to monitor blood sugar twice daily.  CBC, BMET, CPK in one week.  Followup with me in one week.  Followup  with Dr. Montez Morita and Dr. Mare Ferrari as scheduled.  Schedule for stage 2  cardiac rehab.   CONDITION ON DISCHARGE:  Stable.   BRIEF HISTORY AND HOSPITAL COURSE:  [name] is a 60 year old black man  with past medical history significant for coronary artery disease.  History of questionable MI in the past, hypertension, history of  congestive heart failure, diabetes mellitus, COPD, benign prostatic  hypertrophy, degenerative joint disease, morbid obesity, history  of  gouty arthritis, was referred by Dr. Montez Morita for left catheterization. .  The patient complains of exertional dyspnea with minimal exertion.  Associated with feeling weak and tired and no energy.  Denies any chest  pain, nausea, vomiting or diaphoresis.  Denies PND, orthopnea or leg  swelling.  The patient underwent Persantine Myoview on July 28 which  showed fixed defect and anterolateral, anterior and inferior apical  segments with no evidence of ischemia with EF of 37%.  He has multiple  risk factors including strong history of MI in the past, severely  depressed LV systolic function and history of CHF.  The patient was  referred for left heart cath.   PAST MEDICAL HISTORY:  As above.   PAST SURGICAL HISTORY:  He had back  surgery in the past.  He had left  knee arthroscopic surgery in the past.   MEDICATIONS:  1. __________  2. Coreg 6.25 mg every 12 hours.  3. Avapro 300 mg p.o. daily.  4. Lipitor 20 mg daily.  5. Ranitidine 300 mg p.o. daily.  6. Maxzide 25 mg p.o. daily, which was recently stopped.  7. Colchicine 0.6 mg b.i.d.  8. Allopurinol 100 mg b.i.d.  9. Avodart 0.5 mg p.o. daily.  10.Hytrin 10 mg p.o. daily.  11.Lantus insulin 80 units h.s.  12.NovoLog sliding scale.  13.Valium 2 mg t.i.d.  14.Omega fish oil 1000 mg b.i.d.  15.Enteric coated aspirin 81 mg p.o. daily.   ALLERGIES:  KEFLEX.   SOCIAL HISTORY:  He is married, has 3 children, on disability.  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  He smoked less than 1 pack per day for  25 years.  He quit 1 year ago.  He drinks beer socially occasionally.   FAMILY HISTORY:  Father had an MI at age 17 years of age.  Mother is  alive.  One sister has had coronary artery disease  requiring CABG.  One  brother __________.   PHYSICAL EXAMINATION:  GENERAL:  On examination he is alert, awake and  oriented x3.  In no acute distress.  VITAL SIGNS:  Blood pressure  140/80, pulse rate 78 regular  Conjunctivae pink.  NECK: Supple, no JVD.  LUNGS:  Clear to auscultation without rhonchi or rales.  CARDIAC:  S1, S2 is normal.  There is a systolic murmur.  ABDOMEN:  Soft, obese.  Bowel sounds are present and nontender.  EXTREMITIES:  There is no clubbing, cyanosis or edema.   LABS:  His cholesterol was 272, triglycerides 227, HDL 47, LDL 180, CK  was 1140, MB 6.9, relative index 0.6.  Second set CK 1021, MB 6.2,  relative index 0.6, troponin I was 0.05 and 0.04.  Hemoglobin A1c was  8.3, sodium 144, potassium 5.0, chloride 111, bicarb 26, glucose 180,  BUN 41, creatinine 2.75.  On September 10, BUN is 32 and creatinine 2.3,  which has been stable.  His hemoglobin was 10.5, hematocrit 32, white  count of 6.2.  On September 12, hemoglobin is 9.9,  hematocrit 29.7,  white count of 7.3, which has been stable.   BRIEF HOSPITAL COURSE:  The patient was admitted to the telemetry unit  and was started on IV hydration and Mucomyst prior to left  cath.  The  patient subsequently underwent left cath on September 9.  As per  procedure report, the  patient tolerated the procedure well and was  noted to have 70 to 75% proximal RCA stenosis.  Due to his  compromised  renal function, PCI was changed and subsequently underwent PTCA stenting  to proximal RCA on September 11.  See procedure report.  The patient did  not have any episodes of chest pain during the hospital stay.  His renal  function stayed stable.  His groin was stable with no evidence of  hematoma or bruit.  Phase 1 cardiac rehab was called and the patient has  been ambulating in the hallway without any problems.  The patient's  Coreg was increased due to high blood pressure.  The patient  subsequently had onset of APCs and pause of 3.3 seconds, Coreg  was discontinued.  The patient did not have any further episodes of  pauses.  The patient was restarted back on his usual dose of Coreg of  6.25 mg q. 12 hours.  The patient will be discharged home on the above  medications and will be followed up in my office in 1 week.      Allegra Lai. Terrence Dupont, M.D.  Electronically Signed     MNH/MEDQ  D:  06/16/2007  T:  06/16/2007  Job:  [id number]   cc:   Dyanne Carrel, MD  Ardyth Gal. Spruill, M.D.

## 2011-02-13 NOTE — Assessment & Plan Note (Signed)
OFFICE VISIT   [name], [name]  DOB:  1950-12-21                                       06/30/2010  [id number]   This is a postoperative visit.   HISTORY OF PRESENT ILLNESS:  This is a 60 year old male who presents  with chief complaint of follow-up from surgery.  Most recently underwent  on May 31, 2010 a first stage basilic vein transposition in the left  arm.  While waiting for his operation in the holding area, he was noted  to have a thrill in the right arteriovenous graft which previously had  thrombosed for 2 week.  So the patient spontaneously recanalization his  right arteriovenous graft and now has been able to successfully dialyze  through the right forearm graft.  Since then he notes some aching in his  left arm, the basilic vein transposition arm, specificially in the  forearm, which has been improving.  No steal symptoms per se in the left  hand.  No weakness.  No paresthesias in the hand.  In his right hand he  notes since his previous revision surgery and hematoma evacuation some  persistent numbness in the right thumb and some weakness in his first  through third digits that has improved slightly in the second and third  digits, but none in the thumb.   Past medical history, past surgical history, social history, family  history, medications and review of systems are all unchanged from his  previous visits.   PHYSICAL EXAMINATION:  Today he had a blood pressure 165/83 with a heart  rate of 80, temperature 98 with respirations of 12.  On focused exam on  the right hand, there is a palpable thrill and bruit in the right  forearm loop graft.  All his incisions have healed successfully.  No  erythema or drainage from any site.  His right arm continues to have  decreased light touch sensation.  His second and third fingers however  has improved light touch sensation on testing.  He has good hand grip  5/5, intact intrinsic and  extrinsic muscle strength in the right hand.  On his left hand he has full motor strength and sensation in the hand,  and easily palpable radial pulse, also in the right.  There is a  palpable thrill in the upper arm.  On ultrasound interrogation the  basilic vein is noted at this point to be 6-7 mm in diameter.   MEDICAL DECISION MAKING:  This patient is a 60 year old gentleman status  post a left first stage basilic vein transposition.  Since his previous  operation his right arteriovenous graft has spontaneously recanalized  and now is functional.  When I saw him in the holding area prior to  placing the basilic vein transposition we discussed the possibility of  holding off on the basilic vein transposition if his right graft was  functioning.  However, the patient wanted to proceed forward in the  event that eventually this right arteriovenous graft would fail.  At  this point I would continue with dialysis via his right arteriovenous  graft and at the first sign of this graft having problems would  immediately schedule the patient for his second stage of his basilic  vein transposition, the superficialization.  This will allow the basilic  vein additional time to hypertrophy in size and  then this would make it  a better conduit for future dialysis.  If the patient develops any  congestive heart failure symptoms from having two active accesses, at  this point I would consider ligating the graft as the basilic vein has  minimal trauma to it and would last long-term versus this right  arteriovenous graft which miraculously has recannulated, but has already  undergone multiple interventions.  I discussed this plan with the  patient.  At this point he is fine with it and we will see this patient  as needed when his right arteriovenous graft starts to have decreased  flow rates or thrombosis.     Adele Barthel, MD  Electronically Signed   BC/MEDQ  D:  06/30/2010  T:  07/03/2010  Job:   [id number]

## 2011-02-13 NOTE — Assessment & Plan Note (Signed)
[facility]                            CARDIOLOGY OFFICE NOTE   NAME:[name], [name]                        MRN:          [id number]  DATE:05/25/2008                            DOB:          1950-12-30    CARDIOLOGIST:  Ardyth Gal. Spruill, MD.   ENDOCRINOLOGIST:  Jacelyn Pi Loanne Drilling, MD.   RHEUMATOLOGIST:  Lindaann Slough, MD.   SURGEON:  Gwenyth Ober, MD.   INTERVAL HISTORY:  [name] is a 60 year old male with multiple medical  problems including morbid obesity, diabetes, myositis with sed rate of  100, COPD on nighttime oxygen, sleep apnea on CPAP, worsening renal  insufficiency, and mild coronary artery disease status post TAXUS drug-  eluting stent in September 2008.  I had a Myoview with Dr. Terrence Dupont in  2008 which showed an EF of 37% and inferior apical fixed defect.  This  was prior to his catheterization.  Most recent echocardiogram showed  ejection fraction of 55% with mild tricuspid regurgitation.  RV was  normal.   He returns today for routine followup.  He does state he get short of  breath at times; however, his wife is much more adamant.  She says that  when just walking across the store, he gets very short of breath and  breaks out into a sweat.  He notes that his chest hurts in the morning  when he was trying use his CPAP and worse with a deep breath, but when  he takes the CPAP often and walks around, he feels better.  He denies  any orthopnea or PND.  Recently blood work showed a worsening of his  creatinine out to 3.88.  He saw Dr. Florene Glen who has stopped his Avapro  and restarted his Lasix.  He also started him on Norvasc.   He has seen Dr. Hulen Skains for thyroid nodule; apparently he has had a needle  aspiration of this and a biopsy was unremarkable.  They are now  considering open biopsy and they are wanting [place] cardiac clearance prior  to this.   Finally, he does have a history of myositis with CKs well over of 1000  and sed rate  over 100.  He has seen Dr. Elmon Else in the past, who did  not think this was polymyositis.  His statin has been stopped and his CK  is now down to 638.   CURRENT MEDICATIONS:  1. Imdur 60 a day.  2. Novolin insulin.  3. Plavix 75 a day.  4. Coreg 25 b.i.d.  5. Lasix 40 a day.  6. Hytrin 10 a day.  7. Amlodipine 10 a day.   PHYSICAL EXAMINATION:  GENERAL:  He is a large man in no acute distress,  ambulates around the clinic without respiratory difficulty.  VITAL SIGNS:  Blood pressure is 138/80, heart rate is 90, and weight is  286.  HEENT:  Normal.  NECK:  No obvious JVD.  Carotids are 2+ bilateral on bruits.  There is  no lymphadenopathy or thyromegaly appreciated.  CARDIAC:  PMI is  nondisplaced.  He has distant heart  sounds.  He is regular with an S4.  No murmur.  LUNGS:  Clear with mildly reduced breath sounds throughout.  ABDOMEN:  Markedly obese.  No obvious hepatosplenomegaly.  No bruits.  No masses.  EXTREMITIES:  Warm with no cyanosis, clubbing, or edema.  No rash.  NEURO:  Alert and oriented x3.  Cranial nerves II-XII are intact.  Moves  all 4 extremities without difficulty.  Affect is pleasant.   Review of his CPX shows a VO2 of 12.0, which is 51% predicted.  When  corrected to his ideal body weight, this goes up to 18.  The slope is 45  which is significantly elevated.  Spirometry showed a restrictive  breathing pattern.  Overall, this was thought to be a moderate  functional limitation due to combination of a circulatory limitation,  obesity, and lung disease.   ASSESSMENT AND PLAN:  1. Dyspnea.  As his cardiopulmonary exercise test shows, I think his      dyspnea is due to a combined defect.  He obviously has some      diastolic heart failure, but I think his volume status looks good.      Given his worsening renal function, I unfortunately think that his      volume status will soon need to be controlled with either      increasing dose of Lasix and  eventually dialysis.  2. Chest pain.  I do not think this is ischemic in nature.  I asked      him to follow up with the Pulmonary doctors regarding his CPAP.  3. Preoperative risk stratification.  He is now a year out from his      drug-eluting stent.  I think he can safely stop his Plavix for a      short period of time.  I did tell him there is some risk of stent      thrombosis, but I thought this was low enough in order to stop and      permit biopsy if his thyroid nodule to rule out malignancy.  4. History of myositis.  He has a markedly elevated sed rate and I am      wonder if it is reasonable to have him reconciled with Dr. Charlestine Night      to reevaluate as his CKs stayed elevated.  Alternatively, it maybe      worthwhile to get a 24-hour urine to rule out myeloma given his      renal failure.  I will leave this up to Dr. Loanne Drilling and Charlestine Night.   DISPOSITION:  We will see him back in 3-4 months for routine followup.     Shaune Pascal. Bensimhon, MD  Electronically Signed   DRB/MedQ  DD: 05/25/2008  DT: 05/26/2008  Job #: [id number]   cc:   Ardyth Gal. Spruill, M.D.  Sean A. Loanne Drilling, MD  Lindaann Slough, M.D.  Gwenyth Ober, M.D.

## 2011-02-15 ENCOUNTER — Other Ambulatory Visit (HOSPITAL_COMMUNITY): Payer: Self-pay | Admitting: Nephrology

## 2011-02-15 DIAGNOSIS — N186 End stage renal disease: Secondary | ICD-10-CM

## 2011-02-16 NOTE — Consult Note (Signed)
NAMEFREEMON, REATH NO.:  [id number]   MEDICAL RECORD NO.:  [id number]          PATIENT TYPE:  INP   LOCATION:  [id number]                         FACILITY:  [facility]   PHYSICIAN:  Felizardo Hoffmann, M.D.  DATE OF BIRTH:  [birth year]   DATE OF CONSULTATION:  05/06/2006  DATE OF DISCHARGE:  03/20/2006                                   CONSULTATION   [name] continues to report depressed mood, low energy, difficulty  concentrating.  He does not have any adverse Lexapro effects.  He has no  suicidal, no thoughts of harming others, no delusions, no hallucinations.   EXAMINATION:  VITAL SIGNS:  Temperature 97.5, pulse 88, respirations 18,  blood pressure 149/77, O2 saturation on room air is 94%.  MENTAL STATUS EXAM:  [name] is alert.  He is oriented to all spheres.  His affect is constricted.  His speech is soft with normal rate.  THOUGHT PROCESS:  Logical, coherent and goal directed.  No looseness of  associations.  THOUGHT CONTENT:  No thoughts of harming himself, no thoughts of harming  others, no delusions, no hallucinations.  CONCENTRATION:  Moderately decreased.  JUDGMENT:  Intact.   ASSESSMENT:  1.  293.83 mood disorder not otherwise specified, depressed (functional and      general medical factors).  2.  Major depressive disorder, single episode, severe.   RECOMMENDATIONS:  1.  Increase the Lexapro to 10 mg p.o. q.a.m. for antidepression.  2.  Patient agrees to call for any worsening of symptoms or adverse effects.  3.  Discharge planning:  It is anticipated that the patient will have      outpatient psychiatric care post discharge.      Felizardo Hoffmann, M.D.  Electronically Signed     JW/MEDQ  D:  05/06/2006  T:  05/07/2006  Job:  [id number]

## 2011-02-16 NOTE — Discharge Summary (Signed)
NAMEJULION, [name] NO.:  [id number]   MEDICAL RECORD NO.:  [id number]           PATIENT TYPE:   LOCATION:                               FACILITY:  [facility]   PHYSICIAN:  Melburn Hake, M.D.DATE OF BIRTH:  1951-04-12   DATE OF ADMISSION:  03/05/2006  DATE OF DISCHARGE:  03/20/2006                               DISCHARGE SUMMARY   DISCHARGE DIAGNOSES:  1. Congestive heart failure.  2. Hypotension.  3. Acute and chronic respiratory failure.  4. Chronic obstructive pulmonary disease.  5. The patient was very somnolent with altered mental state at time of      admission.  6. Morbid obesity.  7. Probably with obstructive sleep apnea.  8. Diabetes.   BRIEF HISTORY, PHYSICAL, AND HOSPITAL COURSE:  This was an admission for  this 60 year old black male with shortness of breath, chills, swollen  left ankle.  His uric acid was 11.5.  He lives with his wife and has a  teenage boy with cerebral palsy.  He came in with his wife and he stated  that he keeps forgetting where he was and could not remember what he was  saying or anything. He is a diabetic.  He is on Amaryl 4 mg and  metformin 500 mg b.i.d.  He also takes Lantus 10 units at night and she  had a list of his blood sugars, range of 109 to an occasional 300.  On  the night before admission he had a blood sugar dip down to 56.   PHYSICAL EXAMINATION:  VITAL SIGNS:  Physical exam revealed a  temperature of 98.8, a pulse of 88, blood pressure 150/100, respirations  20.  Blood sugar was 98.  Weight was 134 kg.  He is 6 feet 1 inch.  HEENT:  He has very poor dentition.  CHEST:  He had some rhonchi and occasional rales bilaterally.  ABDOMEN:  Obese.  Active bowel sounds.  GENITOURINARY:  He had two descended testicles.  EXTREMITIES:  He could move all his extremities.  His left ankle was  swollen but not tender to the touch as it was home about a week ago.  It  was swollen.   His uric acid was elevated to  11.5.  The patient has gout.  He was  started on allopurinol and colchicine.  He has an enlarged prostate and  occasional dysuria.  At the time of admission the patient was a bit  confused.  His urine was nitrite-positive, protein was greater than 300  and kind of fair amount of hemoglobin and his urine was cloudy.  PSA was  1.09.  TSH was 0.57.  LDL was 95, his lipids, triglycerides 165.  Phosphorus 4.9.  Uric acid came down to about 7.1 and down to 6.6.  Glucose was 161, BUN initially of 36 and a creatinine of 2.5, gradually  came down to about 2.2.  Uric acid came down to 7.1.  BUN came down to  36 and a creatinine of 2.1.  His PCO2 was elevated frequently, O2s were  frequently low.  He had  somewhat echogenic kidneys which showed evidence  of chronic medical renal disease.  He had some enlarged heart and  congestion, which gradually improved with diuresis.  His ejection  fraction on echo was 55%.  We gradually got his uric acid down and we  diuresed him and he lost weight.  His confusion gradually improved.  Neurology felt that his altered mental status was encephalopathy due to  his CO2 narcosis, and he probably had sleep apnea.  The patient was put  on BiPAP.  Gradually with only CO2 BiPAP, treated with antibiotics, his  urine is responding as is his acute bronchitis, he became more alert.  He had probably some pneumonia, which we treated.  We gave him fluids  and diuresed him to help with his renal perfusion.  He had a long  treatment with BiPAP.  Gradually his uric acid came down, CO2 improved.  We also gave him some albumin to help to diurese him, gave him high  doses of Lasix and albumin to get rid of more of that fluid.  His pH  improved to 7.35, PCO2 down to 54.6, O2 stayed low at 52.2.  O2  saturations were 84.7.  Continued to diurese him and we kept him on the  BiPAP.  The recommendation was to make sure he does not smoke any more  and we should get a sleep study once he is  discharged.  Gradually his  congestive failure cleared with Lasix and albumin.  Gradually got his  PCO2 down to 28.8, O2 to 83 on 3 L.  Chest x-ray showed some clearing of  fluid in his lungs.  Gradually the patient became more alert as his CO2  came down, became more coherent and he began to realize that he had  COPD.  The patient became more alert.  He was not aware he had developed  CO2 narcosis.  When he came in at admission, he had a PCO2 of 75.  Hemoglobin was 9.1 with hematocrit of 27.  Creatinine of 2.2.  His  glucose was 176, CO2 was down to 34, BUN 30, albumin was 3.3.  Temperature 98.1, pulse of 79, respirations 17, blood pressure 116/98.  Glucose 175.  We discharged the patient home on CPAP and nasal O2 at 2  L.  [facility] is going to follow him and help in that respect.  Keep him on allopurinol 100 mg b.i.d., p.o. Lasix about 80 mg a day.  We  will send him home on BiPAP on 2 L.  Off of the oxygen he is about 80%,  on the oxygen at 2 L he is up to about 92.9%, so I need to send him home  on oxygen.   The patient was discharged home on his medications and CPAP and needs to  be seen in the office in about 2 weeks, and we will get an outpatient  sleep study on him.  Psych consultation:  He was depressed, severe  depression.  Increase his Lexapro to 10 mg every morning.  He is  supposed to call psych for any worsening of symptoms or any adverse  effects.  It was recommended that he get outpatient psychiatric care  after discharge.  The patient has also BPH, chronic renal insufficiency,  is a diabetic, hypertensive.  Neurology discharge plan recommends sleep  apnea treatment.  So we continued his outpatient medications:   1. Amaryl 4 mg daily.  2. Maxzide 25 mg a day.  3. Ranitidine 300  mg twice a day.  4. Quinapril 40 mg daily.  5. Vytorin 10 mg daily.  6. Terazosin 5 mg daily. 7. We discontinued his metformin and changed that since he has some      renal  insufficiency.  8. Coreg 6.25 mg b.i.d.  9. I am going to continue him on his allopurinol 100 mg t.i.d.  10.Colchicine 0.6 mg once a day.  11.Lantus 10 units daily.   We will also continue to follow him, he is to be in my office in about 2  weeks after discharge.           ______________________________  Melburn Hake, M.D.     CEF/MEDQ  D:  09/18/2006  T:  09/18/2006  Job:  [id number]

## 2011-02-16 NOTE — Procedures (Signed)
[name], [name]                 ACCOUNT NO.:  [id number]   MEDICAL RECORD NO.:  [id number]          PATIENT TYPE:  OUT   LOCATION:  SLEEP CENTER                 FACILITY:  [facility]   PHYSICIAN:  Clinton D. Annamaria Boots, M.D. DATE OF BIRTH:  09-08-1951   DATE OF STUDY:  04/24/2006                              NOCTURNAL POLYSOMNOGRAM   REFERRING PHYSICIAN:  Dr. Grant Fontana.   INDICATION FOR STUDY:  Hypersomnia with sleep apnea.   EPWORTH SLEEPINESS SCORE:  Epworth sleepiness score 10/24.  BMI 35.  Weight  265 pounds.   MEDICATIONS:  Home medication colchicine, Maxzide,  Coreg,  Terazosin,,  Digitek,, allopurinol, Lasix,, quinapril, BiDil tab,  ranitidine, Vytorin,  ,  NovoLog, Lantus,, Darvocet .  The patient reportedly was placed on CPAP  empirically at 9 CWP with oxygen at 2 L/min for nighttime use after recent  hospitalization.  The referring physician's office indicated that the study  should be started without oxygen.   SLEEP ARCHITECTURE:  Short total sleep time 186 minutes with sleep  efficiency 49%.  Stage I was 27%, stage II 68%, stages III and IV were  absent.  REM 4% of total sleep time.  Sleep latency 20 minutes, REM latency  51 minutes, awake after sleep onset 172 minutes, arousal index 54.7  indicating markedly increased sleep fragmentation.  The sleep graft reflects  frequent brief awakenings until around 1:30 a.m. when he was awake for a  prolonged interval.  Subsequently, he had several sustained intervals of  waking before dropping back to sleep again briefly at 5:00 a.m..  He had  taken most of his medications but no specific sleep medication at 11 p.m..   RESPIRATORY DATA:  Apnea/hypopnea index (AHI, RDI) 110.7 obstructive events  per hour indicating severe obstructive sleep apnea/hypopnea syndrome.  There  were 105 obstructive apneas and 239 hypopneas.  Events were not positional,  noting that he spent little time sleeping supine and slept mostly on his  sides.   REM AHI 127 per hour.  He was unable to sustain sleep to achieve the  requirements for a CPAP  titration by split protocol on the study night and  the study was continued as a diagnostic study.   OXYGEN DATA:  Loud snoring with oxygen desaturation to a nadir of 81%.  Mean  oxygen saturation through the study was 89% on room air.   CARDIAC DATA:  Normal sinus rhythm.   MOVEMENT-PARASOMNIA:  No significant limb jerks or motor disturbance.  Bathroom x2.   IMPRESSIONS-RECOMMENDATIONS:  1.  Short and markedly fragmented sleep time with several sustained      intervals, awake.  2.  Severe obstructive sleep apnea/hypopnea syndrome, AHI 110.7 per hour,      with events noted mainly while sleeping on his sides.  Moderately loud      snoring with oxygen desaturation to a nadir of 81%.  3.  He is already on CPAP at 9 CWP at home.  We do not have information on      the basis for choosing that pressure but it may prove sufficient.  4.  Sustained oxygen saturation, mean of 89%  on room air on the study      supports the use of home oxygen at 2 liters per minute bled in through a      CPAP device.  5.  Formal CPAP titration is available if needed, also consider whether a      supplemental sleep medication would be of benefit while the patient uses      CPAP.      Clinton D. Annamaria Boots, M.D.  Diplomate, [employer] of Sleep Medicine  Electronically Signed     CDY/MEDQ  D:  05/04/2006 10:16:59  T:  05/04/2006 12:37:36  Job:  [id number]

## 2011-02-16 NOTE — Op Note (Signed)
NAMEGAVON, [name] NO.:  [id number]   MEDICAL RECORD NO.:  [id number]                   PATIENT TYPE:  AMB   LOCATION:  ENDO                                 FACILITY:  [facility]   PHYSICIAN:  Ronald Lobo, M.D.                DATE OF BIRTH:  [birth year]   DATE OF PROCEDURE:  10/15/2003  DATE OF DISCHARGE:                                 OPERATIVE REPORT   PROCEDURE:  Colonoscopy with biopsy.   INDICATIONS FOR PROCEDURE:  Screening for colon cancer in a 60 year old  gentleman.   FINDINGS:  Small rectal polyp.   PROCEDURE:  The nature, purpose, and risks of the procedure have been  discussed with the patient who provided written consent.  Sedation for this  procedure and the upper endoscopy with dilatation that preceded it, total  Fentanyl 12.5 mg IV.  We had to keep his head positioned so as to avoid  desaturation into the 80s during the course of the exam.  Digital exam of  the prostate was unremarkable.  The Olympus adult video colonoscope was  easily advanced to the cecum and was identified by visualization of the  appendiceal orifice and pull back was then performed.  The quality of the  prep was very good, although a little bit of irrigation was needed here and  there.  It is felt all areas were well seen.  There was a 3 mm firm  erythematous sessile polypoid lesion at about 11 cm from the external anal  opening in the mid rectal region and approximately 6 cold biopsies were  obtained of it.  No other polyps were seen and there was no evidence of  cancer, colitis, or vascular malformations.  Retroflexion was not performed  in the rectum due to the proximity of the rectal biopsies, but no distal  rectal lesions were seen on antegrade viewing.  The patient tolerated the  procedure well and there were no apparent complications.   IMPRESSION:  Diminutive sessile rectal polyp, biopsied as described above  (211.4).   PLAN:  Await pathology  results.                                               Ronald Lobo, M.D.    RB/MEDQ  D:  10/15/2003  T:  10/15/2003  Job:  [id number]   cc:   Melburn Hake, M.D.  [address] [place]  [place] [address]  Fax: [phone]

## 2011-02-16 NOTE — Discharge Summary (Signed)
NAMECAELIN, OPPY                 ACCOUNT NO.:  [id number]   MEDICAL RECORD NO.:  [id number]          PATIENT TYPE:  INP   LOCATION:  [id number]                         FACILITY:  [facility]   PHYSICIAN:  Melburn Hake, M.D.DATE OF BIRTH:  09/03/1951   DATE OF ADMISSION:  04/29/2006  DATE OF DISCHARGE:  05/07/2006                               DISCHARGE SUMMARY   ADMITTING DIAGNOSES:  1. Constant diarrhea.  2. Hepatomegaly.  3. Type 2 diabetes mellitus.  4. Obesity.  5. Hypertension.  6. Obstructive sleep apnea.  7. Depression.  8. Reflux esophagitis.   Diarrhea was intense.  It was found that he had was positive for  Clostridium difficile.  He was admitted, given fluids.   BRIEF HISTORY AND PHYSICAL:  A 60 year old African-American.  He had  been discharged several weeks prior with diabetes, hypertension,  cardiomegaly, COPD and sleep apnea.  He has been a heavy smoker and came  in no appetite and we had seen him about 2-3 weeks prior.  His weight  was 275 and on the day that he came to the office to be admitted he was  at 262.  It was noted that he had had bouts of diarrhea, said he felt he  was just at the end of his rope.   Physical exam revealed pulse of 86, blood pressure 98/72, weight was  262, respirations 17 and his glucose was 214.  Very depressed.  HEENT:  He had poor dental hygiene.  NECK VEINS:  Flat.  CHEST:  Fairly clear.  Some coarse rhonchi at the bases.  ABDOMEN:  Protuberant with some active bowel sounds.  No mass, no  organomegaly.  EXTREMITIES:  He could move all of his extremities, the reflexes were  bilaterally equal.   PERTINENT LABS ON ADMISSION:  First we sent out stools for culture and  C. diff and, as stated, stool came back positive for C. diff.  His urine  had some hyaline casts.  The LDL cholesterol was 112, cholesterol was  221, potassium is up a little a bit at 5.8, sodium was normal, glucose  was 185.  The liver functions are a little bit  elevated.  The AST was  49, ALT was 87, a uric acid of 9.  His hemoglobin A1c was 10.5.  He was  in very good control of his type 2 diabetes.  His white count 5.4 with a  hemoglobin of 14.5.  Ultrasound of his abdomen showed some echogenicity  of his left renal cortex, suggesting some chronic renal disease, also  with GI dysphagia, esophageal motility with gastroesophageal reflux, no  masses identified.   He had significantly delayed gastric emptying that we did.  We gave him  nebulizer treatments.  He was put on Lovenox nutrition protocol.  For  his depression, Psych saw him and started him on Lexapro 5 mg every  morning.  He was started on Flagyl 500 mg t.i.d. p.o.  We had started  him on Pepcid but we stopped it and put him on Protonix 40 mg a day and  we added Reglan  5 mg a.c. and h.s. and increased his Lantus to 20 units  h.s. and put him on a sliding scale Humalog on moderate resistance and  increased his Lantus to 25 h.s. and gradually his diarrhea stopped.  He  had negative stools on C. diff and we discharged him home on colchicine  0.6 twice a day, Maxzide 25 __________, Coreg 6.25 mg twice a day,  Lanoxin 0.125 daily, allopurinol 100 mg p.o. twice a day, Zantac 300 mg  once a day, Vytorin 10/20 once a day, Lantus 20 units at night.  On  sliding scale with NovoLog moderate sliding scale and we put him on  BiDil three times a day, to take at home, and we kept him on Flagyl for  his C. diff, he was on 500 mg three times a day and we sent him home on  250 mg four times a day and we will keep him on that.  He improved  considerably and we sent him home.  Is to see me in two weeks and he is  going to see GI also.  We sent him home on Lexapro, we increased it to  10 mg daily.           ______________________________  Melburn Hake, M.D.     CEF/MEDQ  D:  08/31/2006  T:  09/01/2006  Job:  [id number]

## 2011-02-16 NOTE — Consult Note (Signed)
[name], [name] NO.:  [id number]   MEDICAL RECORD NO.:  [id number]                   PATIENT TYPE:  EMS   LOCATION:  MAJO                                 FACILITY:  [facility]   PHYSICIAN:  Ronald Lobo, M.D.                DATE OF BIRTH:  07-Feb-1951   DATE OF CONSULTATION:  09/03/2003  DATE OF DISCHARGE:                                   CONSULTATION   CONSULTING PHYSICIAN:  Ronald Lobo, M.D.   PROCEDURE:  Gastroenterology consultation.   Dr. Hardin Negus, of the [facility] Emergency Room staff, asked me to see  this 60 year old gentleman because of dysphagia and an abnormal radiographic  appearance of the esophagus.   [name] gives a roughly two year history of intermittent dysphagia to  solid foods, with transient food impactions which would always resolve  within 15 minutes or so, occurring perhaps twice a month.  Last night, he  was eating [place] and it became stuck and did not resolve.  Attempts to get  it out with ginger ale and medication were unsuccessful, so he presented to  the emergency room where a barium swallow was obtained showing a polypoid  mass like lesion at the gastroesophageal junction.  At this time, the  patient is unable to consume any fluids without them coming back up; just a  tiny whiff of barium got past this obstructing abnormality.   PAST MEDICAL HISTORY:   ALLERGIES:  KEFLEX (tongue swelling).   CURRENT MEDICATIONS:  As an outpatient include:  Triamterene, Accupril,  Amaryl, Tramadol, prazosin, Rantidine, Coreg, Lipitor.   OPERATIONS:  Include:  1. Back surgery x 3.  2. Surgery on his right hand.  No abdominal surgery.   MEDICAL ILLNESSES:  Include:  1. Hypertension.  2. Type 2 diabetes of several years duration.  3. What sounds like a cardiomyopathy with reduced ejection fraction,     although there is no definite history of MI nor any history of COPD.   HABITS:  He smokes one pack per day and  has moderate ethanol.   FAMILY HISTORY:  Not obtained.   SOCIAL HISTORY:  Disabled, former Engineer, civil (consulting), lives with his wife,  [name], who accompanies him to the emergency room tonight.   REVIEW OF SYSTEMS:  Positive for reflux symptoms for which is on Ranitidine.  Possible remote history of ulcer disease.  No problem with anorexia, chronic  abdominal pain, constipation, diarrhea or rectal bleeding.   PHYSICAL EXAMINATION:  GENERAL:  [name] is a very stocky, stout, African  American male in no evident distress.  He is anicteric and without frank  pallor.  CHEST:  Clear.  HEART:  Sounds are distant.  ABDOMEN:  Substantially adipose and firm but without discernable  organomegaly, guarding, mass or tenderness.   LABS:  None obtained.   X-RAY:  I reviewed his barium swallow and there is  a roughly 2- to -3-cm  filling defect in the distal esophagus.  Contrary to the radiologist's  interpretation, in view of the patient's history, I think this is most  compatible with an impacted food bolus rather than any kind of intrinsic  esophageal mass/lesion.   IMPRESSION:  Probable food impaction.   RECOMMENDATIONS:  Endoscopic extraction.   I have discussed the nature, purpose, and risks of the procedure with the  patient and his wife, and they are agreeable to proceed.                                               Ronald Lobo, M.D.    RB/MEDQ  D:  09/04/2003  T:  09/05/2003  Job:  [id number]   cc:   Melburn Hake, M.D.  [address] [place]  [place] [address]  Fax: [phone]

## 2011-02-16 NOTE — Op Note (Signed)
[name], LUNN NO.:  [id number]   MEDICAL RECORD NO.:  [id number]                   PATIENT TYPE:  AMB   LOCATION:  ENDO                                 FACILITY:  [facility]   PHYSICIAN:  Ronald Lobo, M.D.                DATE OF BIRTH:  1950/11/30   DATE OF PROCEDURE:  10/15/2003  DATE OF DISCHARGE:                                 OPERATIVE REPORT   PROCEDURE:  Upper endoscopy with Savary dilatation of the esophagus under  fluoroscopy.   INDICATIONS FOR PROCEDURE:  60 year old gentleman who is about six weeks  status post food impaction and has long-standing history of intermittent  dysphagia.   FINDINGS:  Esophageal ring above 2 cm hiatal hernia dilated to 18 mm.   PROCEDURE:  The nature, purpose, and risks of the procedure have been  discussed with the patient who provided written consent.  Sedation was  Versed 7.5 mg IV resulting in mild sedation.  The patient is obese and has a  heavy body habitus and probably had some degree of sleep apnea type symptoms  and was transiently desaturating down into the high 70s during the exam,  even though he was conversant and active throughout the procedure.  The  Olympus video endoscope was passed under direct vision, entering the  esophagus readily.  The vocal cords are not well seen.  The esophageal  mucosa was unremarkable with just a little bit of erythema right at the  squamocolumnar junction.  No significant reflux esophagitis was observed,  but there was some free reflux occurring during the exam.  No Barrett's  esophagus, varices, infection, or neoplasia were observed.  There was a  widely patent esophageal ring somewhat muscular in appearance at the  squamocolumnar junction and below this was a 2 cm hiatal hernia.  The  stomach contained no significant residual.  There was a very superficial  prepyloric erosion measuring about 2 by 6 cm.  No frank ulcers or diffuse  gastritis were observed.   No polyps or masses including retroflexion of the  proximal stomach.  The duodenal bulb and the proximal duodenum appeared  grossly normal.  Savary dilatation was then performed in a standard fashion,  passing the guide-wire into the antrum of the stomach, removing the scope in  an exchange fashion, confirming the position of the wire fluoroscopically  and then making a single passage with an 18 mm dilator under fluoroscopic  guidance, encountering smooth resistance.  The patient was then re-  endoscoped under direct vision.  The ring appeared intact but there was a  little bit of blood in the esophagus, the exact origin of which was not  determined.  There was no evidence of undue trauma to the stomach,  esophagus, or hypopharynx.  Although the patient was somewhat restless  during the procedure and there was some transient desaturation, as noted,  overall, he tolerated  it well and there were no apparent complications.   IMPRESSION:  1. Esophageal ring dilated to 18 mm as described above (530.3).  2. Prepyloric erosion.   PLAN:  1. Clinical follow up of dysphagia symptoms.  2. The patient will be advised to continue PPI therapy indefinitely.                                               Ronald Lobo, M.D.    RB/MEDQ  D:  10/15/2003  T:  10/15/2003  Job:  [id number]   cc:   Melburn Hake, M.D.  [address] [place]  [place] [address]  Fax: [phone]

## 2011-02-16 NOTE — Consult Note (Signed)
NAMEKASSEN, FINE                 ACCOUNT NO.:  [id number]   MEDICAL RECORD NO.:  [id number]          PATIENT TYPE:  INP   LOCATION:  2921                         FACILITY:  [facility]   PHYSICIAN:  Pramod P. Leonie Man, MD    DATE OF BIRTH:  1951-08-29   DATE OF CONSULTATION:  03/06/2006  DATE OF DISCHARGE:                                   CONSULTATION   REFERRING PHYSICIAN:  Melburn Hake, M.D.   REASON FOR CONSULTATION:  Confusion and memory loss.   HISTORY OF PRESENT ILLNESS:  Mr. Eschbach is a 60 year old obese African  American gentleman who has had subacute confusion and altered mental status  with sleepiness for the last couple of weeks.  The patient has mild  confusion, disorientation and had a CT myelogram of the lumbar spine done on  Feb 21, 2006, following which he had progressive confusion, particularly in  the last several days.  He has also been sleepy and hard to arouse.  He also  had an episode of left foot pain and swelling which was thought to be gout  and treated with Allopurinol.  patient has had no history of recent  headache, focal extremity weakness, gait or balance problems.  He has no  known history of neurological problems.  He does have a mild diabetic  sensory neuropathy and walks with slightly broad based gate.   PAST MEDICAL HISTORY:  1.  Diabetes with mild renal insufficiency.  2.  Chronic back pain.   PAST SURGICAL HISTORY:  Back surgery for chronic back pain.   HOME MEDICATIONS:  1.  Amaryl.  2.  Maxzide.  3.  Ranitidine.  4.  Quinapril.  5.  Vytorin.  6.  Terazosin.  7.  Metformin.  8.  Coreg.  9.  Colchicine.  10. Allopurinol.  11. Darvocet.   ALLERGIES:  NO KNOWN DRUG ALLERGIES.   SOCIAL HISTORY:  Patient lives at home with his wife.   REVIEW OF SYSTEMS:  As documented above.   PHYSICAL EXAMINATION:  GENERAL APPEARANCE:  An obese African American  gentleman who is in respiratory distress.  VITAL SIGNS:  He had a blood gas earlier  today with a pCO2 of 75 and pO2 of  68.  His blood gas was repeated and a pCO2 was found to be again elevated at  70.  He has been placed on a BiPAP mask with pressure of 14 and is breathing  normal 20 per minutes with sats of 92%.  Follow-up blood gas is pending.  Pulse rate is 82 per minute and regular, respiratory 16 per minute, blood  pressure 146/80, he is afebrile.  HEENT:  Head is atraumatic.  NECK:  He has a short, thick neck.  LUNGS:  Clear to auscultation.  CARDIOVASCULAR:  Regular heart sounds.  ABDOMEN:  Distended, nontender.  NEUROLOGIC:  Patient is lethargic, he can open his eyes and follow one and  two-step commands but falls back to sleep.  He can be aroused easily.  Eye  movements are full range without nystagmus.  Face is symmetric.  Palatal  movements  are normal.  Tongue is midline.  Motor system exam:  Patient is  voluntarily able to move all four extremities against gravity, though exact  strength testing is limited.  Deep tendon reflexes are 2+ and symmetric.  Ankle jerks are depressed.  Plantars are downgoing.  Sensation and  coordination cannot be reliably tested.   LABORATORY DATA:  BUN 38, creatinine 2.2.  Electrolytes on admission are  normal.  Potassium was elevated at 5.3.   Myelogram of the lumbar spine dated Feb 23, 2005, shows postoperative  changes and multilevel degenerative changes.   IMPRESSION:  A 60 year old gentleman with altered mental status for the last  two weeks likely secondary to hypoventilatory respiratory failure.  Etiology  probably sleep apnea as well as medication effect from narcotics for his  back pain.  Recent worsening of his renal function may be attributed to  contrast induced nephropathy from the myelogram dye.   PLAN:  Patient will benefit with pulmonary critical care consult to help  manage his ventilatory status both in the hospital as well as outpatient  evaluation and treatment for sleep apnea.  Adequate hydration and  management  of his renal function.  I will hold off on any further diagnostic  neurological testing until he is stable.  He will benefit from CT scan of  the head to rule out any acute brain abnormality.  This may be done when  patient is stable.   I will be happy to follow the patient.  Kindly call for questions.  I had a  long discussion with the patient's wife and son regarding his clinical  status.  Discussed plan for evaluation, treatment and answered questions.           ______________________________  Kathie Rhodes. Leonie Man, MD     PPS/MEDQ  D:  03/06/2006  T:  03/07/2006  Job:  [id number]

## 2011-02-16 NOTE — Consult Note (Signed)
NAMEJAYLANI, [name]                 ACCOUNT NO.:  [id number]   MEDICAL RECORD NO.:  [id number]          PATIENT TYPE:  INP   LOCATION:  2921                         FACILITY:  [facility]   PHYSICIAN:  Hanley Ben, M.D.  DATE OF BIRTH:  1950-12-18   DATE OF CONSULTATION:  03/14/2006  DATE OF DISCHARGE:                                   CONSULTATION   REASON FOR CONSULTATION:  Difficulty voiding.   HISTORY OF PRESENT ILLNESS:  The patient is a 60 year old male who was  admitted on March 05, 2006 with confusion and altered mental status.  Before  admission he was having frequency, urgency and nocturia x4-5.  However he  had been voiding with a good flow.  His urinary output had decreased after  removal of the Foley catheter.  The urinary output has since increased with  diuretics.  He is voiding frequently now but he has a good flow.  He denies  dysuria, hematuria or straining on urination.   PAST MEDICAL HISTORY:  Positive for diabetes, chronic back pain.   MEDICATIONS:  Coreg 6.25 mg twice a day, Lanoxin 0.125 mg daily, Vytorin  10/20 one tablet daily, Zyloprim 100 mg twice daily, Pepcid 40 mg daily,  colchicine 0.6 mg twice a day, Ventolin 2.5 mg 4 times a day, Atrovent 0.5  mg 4 times a day.   ALLERGIES:  NO KNOWN DRUG ALLERGIES.   SOCIAL HISTORY:  He is married, has 3 children, has smoked about 1 pack a  day for 40 years.  Does not drink.   FAMILY HISTORY:  Positive for heart disease.  His mother has Alzheimer's  disease,he has 1 brother and 1 sister.   REVIEW OF SYSTEMS:  He has shortness of breath, frequency, urgency, nocturia  x4-5.  He was admitted with confusion.  All others are negative.   PHYSICAL EXAMINATION:  GENERAL:  This is an obese 60 year old male.  He is  now oriented to time, place and person.  VITAL SIGNS:  Blood pressure 154/74, pulse 82, respirations 20, temperature  99.2.  ABDOMEN:  Obese, non-tender.  He has no CVA tenderness.  Kidneys are not  palpable.   He has no hepatomegaly, no splenomegaly.  He has no inguinal  adenopathy, no inguinal hernia.  Penis is circumcised.  The meatus is  normal.  Scrotum is normal.  No testicular mass.  Cords and epididymis are  within normal limits.  RECTAL:  He has no external hemorrhoids.  Sphincter tone is normal.  Prostate is enlarged, 40 grams, no nodules.  Seminal vesicles not palpable.   REVIEW OF STUDIES:  Urinalysis shows few bacteria, 0-2 WBCs, 3-6 RBCs, more  than 300 mg of protein and is nitrite positive.  BUN is 23, creatinine 2,  hemoglobin 11.9, hematocrit 37.4 and WBC 4.4.  PSA 1.09.   IMPRESSION:  1.  Renal insufficiency.  2.  Benign prostatic hypertrophy.  3.  Diabetes.  4.  Hypertension.   SUGGESTIONS:  Renal ultrasound to rule out hydronephrosis.  Note that urine  culture on June 10 was negative.   Thank you, we  will follow the patient with you.      Hanley Ben, M.D.  Electronically Signed     MN/MEDQ  D:  03/14/2006  T:  03/14/2006  Job:  [id number]

## 2011-02-16 NOTE — Op Note (Signed)
NAMEALEXI, [name] NO.:  [id number]   MEDICAL RECORD NO.:  [id number]                   PATIENT TYPE:  EMS   LOCATION:  MAJO                                 FACILITY:  [facility]   PHYSICIAN:  Ronald Lobo, M.D.                DATE OF BIRTH:  April 12, 1951   DATE OF PROCEDURE:  09/04/2003  DATE OF DISCHARGE:  09/04/2003                                 OPERATIVE REPORT   PROCEDURE:  Upper endoscopy with removal of foreign body.   INDICATIONS:  This is a 60 year old gentleman with a longstanding history of  intermittent dysphagia to solid foods who developed a good impaction  ([place]) last night and finally presented to the emergency room today when  it never passed on its own despite attempts at self-treatment.  A barium  swallow confirms obstruction of the distal esophagus.   FINDINGS:  Large (3-4 cm) food bolus extracted as a single piece.  Esophageal ring present.   DESCRIPTION OF PROCEDURE:  The nature, purpose, and risks of the procedure  had been discussed with the patient and his wife, and he provided written  consent.  Sedation was Versed 5 mg IV to a level of mild sedation.  The  Olympus small caliber adult video endoscope was passed under direct vision.  There was no evidence of clinical instability during the course of this  procedure.   The esophagus was entered without difficulty.  There was some retained fluid  within the esophagus which was suctioned up.  Advancing the scope into the  distal esophagus, we quickly encountered the food bolus which was colored  white in view of the recent barium ingestion.  Fortunately, it was mobile  and able to be completely grasped with the [employer].  It was  then extracted out through the patient's mouth in a single piece without  difficulty.   The patient was then re-endoscoped under direct vision.  There was a fairly  tight esophageal ring at the squamocolumnar junction, but no evidence  of  resistance to passage of the 9-mm endoscope.  There was a little bit of  stasis esophagitis right at the ring but no evidence of any chronic  esophagitis, Barrett's esophagus, varices, infection, tumor, neoplasia, or  Mallory-Weiss tear.  No significant hiatal hernia was appreciated.   The stomach contained no residual, and he had normal mucosa.  No gastritis,  erosions, ulcers, polyps, or masses were observed including a retroflexed  view of the cardia which had a normal appearance and was free of any evident  hiatal hernia.  The pylorus and duodenal bulb looked normal.   The scope was removed from the patient.  He tolerated the procedure well,  and there were no apparent complications.   IMPRESSION:  Successful removal of esophageal foreign body as described  above.  Esophageal ring present accounting for the food impaction.   PLAN:  Elective outpatient dilatation.  Daily use of Prilosec over the  counter in the meantime.                                               Ronald Lobo, M.D.    RB/MEDQ  D:  09/04/2003  T:  09/05/2003  Job:  [id number]   cc:   Melburn Hake, M.D.  [address] [place]  [place] [address]  Fax: [phone]

## 2011-02-16 NOTE — Consult Note (Signed)
[name] NO.:  [id number]   MEDICAL RECORD NO.:  [id number]          PATIENT TYPE:  INP   LOCATION:  [id number]                         FACILITY:  [facility]   PHYSICIAN:  Felizardo Hoffmann, M.D.  DATE OF BIRTH:  [birth year]   DATE OF CONSULTATION:  05/02/2006  DATE OF DISCHARGE:                                   CONSULTATION   INPATIENT CONSULTATION   REASON FOR CONSULTATION:  Depression   HISTORY OF PRESENT ILLNESS:  [name] is a 60 year old married male  who was admitted to the [facility] on April 29, 2006 with a  loss of appetite.   [name] states that he has had several days of poor appetite, poor energy,  poor concentration, lack of interest, and easy crying.  This is very unusual  for him and he has had no unusual psychosocial stresses.  Treatment of his  general medical problems has not resulted in improvement in his energy.   The patient was discovered to have obstructive sleep apnea which was treated  with CPAP.  The depressive symptoms have continued, however.  [name] has  had no thoughts of harming himself, no thoughts of harming others, no  delusions, no hallucinations.   With the patient's permission and request, his wife joined in on the session  today and she notes his depressive symptoms and is very concerned.   PAST PSYCHIATRIC HISTORY:  The patient has no history of previous  depression.  No history of mania.  No history of hallucinations or  delusions.  He has never had any psychiatric treatment.  He has no  psychiatric hospitalizations and he has no history of suicide attempts.   FAMILY PSYCHIATRIC HISTORY:  None known other than his mother had  Alzheimer's disease.   SOCIAL HISTORY:  [name] is married.  He is disabled.  He does not use  alcohol or illegal drugs.  He has 3 children.  His religion is [employer].  He  has a supportive marriage.   PAST MEDICAL HISTORY:  Obstructive sleep apnea and diabetes   CURRENT MEDICATIONS:  The MAR is reviewed.  He is not currently on any  psychotropic's.   ALLERGIES:  THE PATIENT IS ALLERGIC TO CEPHALOSPORINS.   LABORATORY DATA:  CBC is unremarkable.  Basic metabolic panel:  Remarkable  for creatinine of 2.6, BUN is 78, SGOT of 49, SGTP of 87.  His hemoglobin  A1c is elevated at 10.5.  Calcium is within normal limits at 9.6.  His  digoxin level was slightly decreased at 0.7   REVIEW OF SYSTEMS:  CONSTITUTION:  Afebrile.  HEAD:  No trauma.  EYES:  No  visual changes.  EARS:  No hearing impairment.  NOSE:  No rhinorrhea.  MOUTH/THROAT:  No sore throat.  NEUROLOGIC:  Unremarkable.  PSYCHIATRIC:  As  above.  CARDIOVASCULAR:  No chest pain, palpitations, or edema.  RESPIRATORY:  No coughing or wheezing.  The patient has his CPAP unit in his  hospital room and is using it regularly without discomfort.  GASTROINTESTINAL:  No nausea, vomiting,  or diarrhea.  GENITOURINARY:  Noncontributory. SKIN:  Unremarkable.  MUSCULOSKELETAL:  The patient has a  history of 3 back surgeries and right hand surgery.  He does have chronic  back pain.  ENDOCRINE/METABOLIC:  As above.  HEMATOLOGIC/LYMPHATIC:  Unremarkable.   PHYSICAL EXAMINATION:  VITAL SIGNS:  Temperature 97.4, pulse 78,  respirations 18, blood pressure 153/82, 02 saturation on room air is 96%.   MENTAL STATUS EXAM:  [name] is an alert male lying in the left lateral  decubitus position in his hospital bed, with fairly good eye contact and  constricted affect.  He is oriented to all spheres.  His fund of knowledge  and intelligence are average.  His speech is soft and at normal rate.  Mood  is depressed.  Thought process:  Logical, coherent, goal directed.  No  looseness of association.  Thought content:  No thoughts of harming himself,  no thoughts of harming others.  No delusions.  No hallucinations.  Memory is  intact to immediate, recent, and remote.  Concentration is decreased.  Insight is fairly  good.  Judgment is intact.   ASSESSMENT:  AXIS I:  1.  Mood disorder, not otherwise specified (293.83), depressed (functional      and general medical factors).  2.  Rule out major depressive disorder, single episode severe (296.23).  AXIS II:  Deferred.  AXIS III:  See general medical problems.  AXIS IV:  General medical.  AXIS V:  55   DISCUSSION:  [name] is not at risk to harm himself or others and he  agrees to call emergency services for any emergency psychiatric symptoms.   Informed consent:  The indications, alternatives, and adverse effects of  Lexapro were discussed with the patient for antidepressants.  He understands  the above information and wants to start Lexapro.   RECOMMENDATIONS:  1.  Would start Lexapro at a lower dose of 5 mg q. a.m. and increase by 5 mg      in 3 days, if tolerated, to 10 mg p.o. q. a.m. as the initial trial dose      for antidepressants.  2.  We will continue to monitor and follow [name] through this hospital      course.  3.  Anticipated psychiatric outpatient followup would involve psychotropic      medication management by one of the outpatient psychiatric clinics at      [facility], [facility], or [place].  Another option would      be the county mental health center.  4.  Also, it would be ideal for the patient to have psychotherapy combined      with this Lexapro trial.  The counseling would synergize with the      Lexapro.  Would advise that cognitive behavioral therapy be included in      the psychotherapy.      Felizardo Hoffmann, M.D.  Electronically Signed     JW/MEDQ  D:  05/02/2006  T:  05/02/2006  Job:  [id number]

## 2011-02-16 NOTE — Consult Note (Signed)
NAMETRIMAINE, FERRIGNO NO.:  [id number]   MEDICAL RECORD NO.:  [id number]          PATIENT TYPE:  INP   LOCATION:  5738                         FACILITY:  [facility]   PHYSICIAN:  Ronald Lobo, M.D.   DATE OF BIRTH:  09/19/1951   DATE OF CONSULTATION:  05/04/2006  DATE OF DISCHARGE:                                   CONSULTATION   GASTROENTEROLOGY CONSULTATION:  Dr. Mare Ferrari asked me to see this 60 year old  African American male because of hepatomegaly.   Mr. Hargan is known to me from a couple of years ago when I performed  endoscopy on him to remove an esophageal food impaction.  At that time, he  had a Schatzki's ring, and I believe it was subsequently dilated, and he  underwent, I believe, screening colonoscopy at the same time.   With that background, the patient was admitted to the hospital several days  ago because of diarrhea which began after a recent hospitalization for some  apparent CHF.  He was found to be C. difficile positive and is on Flagyl now  and having marked improvement in his bowel function.   It was also noted that he had moderately severe azotemia and had been  feeling sick, prompting an ultrasound which showed significant  hepatomegaly without focal abnormalities or biliary dilatation.  It was also  noted that his liver chemistries were mildly elevated.  He has no known  history of liver disease.  He has some very minimal ethanol consumption.   However, the patient does have history of significant obesity (although he  has intentionally lost about 40 pounds in recent months) and it is type 2  diabetic of perhaps 5 years duration or, thus placing him at risk for  steatohepatitis (ulceration of the liver).   PAST MEDICAL HISTORY:   ALLERGIES:  CEPHALOSPORINS (KEFLEX causes tongue swelling).   OUTPATIENT MEDICATIONS:  Include colchicine, Maxzide, Coreg, terazosin,  Digitek, allopurinol, Lasix, Clinoril, Biodel, ranitidine, Vitorin,  NovoLog,  Lantus insulin, and Darvocet.   OPERATIONS:  No abdominal surgery.  He has had some orthopedic procedures.   CHRONIC MEDICAL ILLNESSES:  1.  Sleep apnea.  2.  Obesity.  3.  Hypertension,.  4.  Possibly some CHF   HABITS:  As noted, alcohol use has been very modest in the past.   FAMILY HISTORY:  Not obtained.   SOCIAL HISTORY:  Married, disabled Dealer.   REVIEW OF SYSTEMS:  Irregularity of bowel habit, alternating between  constipation and diarrhea.  Otherwise negative for dysphagia, loss of  appetite, involuntary weight loss (although his recent weight loss may have  been aided and abetted by his C. difficile infection ( except for chronic  abdominal pain.   PHYSICAL EXAMINATION:  GENERAL:  A very stout, stocky, African-American  gentleman despite his 40-pound weight loss.  He is anicteric.  There is a  hint of pulmonary erythema.  Chest and heart are unremarkable.  Liver span  by scratch test is indeed about 20 cm which is what the office monitor  found.  The liver edge, however, is not  palpable, perhaps in part due to the  patient's adiposity. I did not appreciate splenomegaly.  (His spleen is  normal size by ultrasound.)  No abdominal guarding, mass, or tenderness are  appreciated.   LABORATORY DATA:  White count 5400, hemoglobin 13.3, MCV 89, platelets  174,000.  Differential shows 46 polys, 38 lymphs, 14 monocytes. Chemistry  panel pertinent for BUN 50, creatinine 2.3. (On presentation to the  hospital, the patient was probably dehydrated.  He had a BUN of 98 with  creatinine 3.9.)  Bilirubin 0.7, alkaline phosphatase 62, SGOT 66, SGPT 118,  albumin 3.5 with a calcium of 10.1   Ultrasound:  I have reviewed the patient's ultrasound with the radiologist.  It shows some increased echogenicity, but with liver contour going against  nodularity as might precede cirrhosis, no evidence of ascites or  splenomegaly and no __________, no biliary ductal  dilatation.   IMPRESSION:  Hepatomegaly and mild elevation of her chemistries most likely  related to fatty infiltration of the liver due to the patient's obesity and  diabetes.  Fortunately, there is no evidence of cirrhosis or portal  hypertension by pelvic ultrasound or by the patient's endoscopy tube 2 years  ago, or by the patient's labs (for example, normal platelet).  1.  Clostridium difficile infection, improving, currently related to recent      hospitalization.  2.  Past history of dysphagia responding to esophageal dilatation after food      impaction.   PLAN:  1.  Check hepatitis markers etc.  2.  At present, I see no need for liver biopsy.  3.  Change Flagyl from 500 mg 3 times a day to 250 mg 4 times a day to      perhaps reduce any nausea that might come from that medication as well      was increasing efficacy by giving more frequent doses.  4.  Add probiotics.  I further advised the patient to use probiotics in the      event of future antibiotic therapy   I believe the patient is up to date on screening, but I will pull his office  chart and make arrangements for screening colonoscopy in due course if he is  not up to date.           ______________________________  Ronald Lobo, M.D.     RB/MEDQ  D:  05/04/2006  T:  05/05/2006  Job:  [id number]   cc:   Melburn Hake, M.D.

## 2011-02-21 ENCOUNTER — Ambulatory Visit (HOSPITAL_COMMUNITY): Payer: Medicare HMO

## 2011-02-21 ENCOUNTER — Ambulatory Visit (HOSPITAL_COMMUNITY)
Admission: RE | Admit: 2011-02-21 | Discharge: 2011-02-21 | Disposition: A | Discharge status: Home / Self Care | Payer: Medicare HMO | Source: Ambulatory Visit | Attending: Surgery | Admitting: Surgery

## 2011-02-21 DIAGNOSIS — K219 Gastro-esophageal reflux disease without esophagitis: Secondary | ICD-10-CM | POA: Insufficient documentation

## 2011-02-21 DIAGNOSIS — F0390 Unspecified dementia without behavioral disturbance: Secondary | ICD-10-CM | POA: Insufficient documentation

## 2011-02-21 DIAGNOSIS — E042 Nontoxic multinodular goiter: Secondary | ICD-10-CM | POA: Insufficient documentation

## 2011-02-21 DIAGNOSIS — Z992 Dependence on renal dialysis: Secondary | ICD-10-CM | POA: Insufficient documentation

## 2011-02-21 DIAGNOSIS — N186 End stage renal disease: Secondary | ICD-10-CM | POA: Insufficient documentation

## 2011-02-21 DIAGNOSIS — Y832 Surgical operation with anastomosis, bypass or graft as the cause of abnormal reaction of the patient, or of later complication, without mention of misadventure at the time of the procedure: Secondary | ICD-10-CM | POA: Insufficient documentation

## 2011-02-21 DIAGNOSIS — I2510 Atherosclerotic heart disease of native coronary artery without angina pectoris: Secondary | ICD-10-CM | POA: Insufficient documentation

## 2011-02-21 DIAGNOSIS — T82898A Other specified complication of vascular prosthetic devices, implants and grafts, initial encounter: Secondary | ICD-10-CM

## 2011-02-21 DIAGNOSIS — Z79899 Other long term (current) drug therapy: Secondary | ICD-10-CM | POA: Insufficient documentation

## 2011-02-21 DIAGNOSIS — G609 Hereditary and idiopathic neuropathy, unspecified: Secondary | ICD-10-CM | POA: Insufficient documentation

## 2011-02-21 DIAGNOSIS — I120 Hypertensive chronic kidney disease with stage 5 chronic kidney disease or end stage renal disease: Secondary | ICD-10-CM

## 2011-02-21 DIAGNOSIS — Z794 Long term (current) use of insulin: Secondary | ICD-10-CM | POA: Insufficient documentation

## 2011-02-21 DIAGNOSIS — E119 Type 2 diabetes mellitus without complications: Secondary | ICD-10-CM | POA: Insufficient documentation

## 2011-02-21 DIAGNOSIS — N400 Enlarged prostate without lower urinary tract symptoms: Secondary | ICD-10-CM | POA: Insufficient documentation

## 2011-02-21 LAB — POCT I-STAT, CHEM 8
BUN: 44 mg/dL — ABNORMAL HIGH (ref 6–23)
Calcium, Ion: 0.9 mmol/L — ABNORMAL LOW (ref 1.12–1.32)
Creatinine, Ser: 11.3 mg/dL — ABNORMAL HIGH (ref 0.4–1.5)
Glucose, Bld: 101 mg/dL — ABNORMAL HIGH (ref 70–99)
Hemoglobin: 11.9 g/dL — ABNORMAL LOW (ref 13.0–17.0)
TCO2: 31 mmol/L (ref 0–100)

## 2011-02-23 ENCOUNTER — Ambulatory Visit (INDEPENDENT_AMBULATORY_CARE_PROVIDER_SITE_OTHER): Payer: Medicare HMO | Admitting: Vascular Surgery

## 2011-02-23 ENCOUNTER — Ambulatory Visit: Payer: Medicare HMO | Admitting: Endocrinology

## 2011-02-23 DIAGNOSIS — N186 End stage renal disease: Secondary | ICD-10-CM

## 2011-02-27 NOTE — Assessment & Plan Note (Signed)
OFFICE VISIT  BERTEL, KONEFAL V DOB:  [birth year]                                       02/23/2011 [id number]  HISTORY OF PRESENT ILLNESS:  This is a 60 year old gentleman who presents status post a left basilic vein transposition done in staged fashion.  He had a transposed basilic vein that had matured adequately by my [facility] evaluation.  He had had 2 attempted cannulations.  The first one was not successful.  The second attempt resulted in what sounds like a pseudoaneurysm and based on the story from the patient it sounds like they may have inadvertently occluded this left basilic vein transposition in the process of compressing the pseudoaneurysm.  At no point from any of the documents I have obtained was there an attempt at salvage of this basilic vein transposition and the patient is not aware of how long this fistula has been thrombosed so at this point he thinks greater than 2 weeks.  Recently Dr. Trula Slade did a shuntogram on his right arm and intervened on that graft successfully.  At this point the patient continues to dialyze through his right arm graft.  PHYSICAL EXAMINATION:  He had a blood pressure 152/95, heart rate of 105, respirations were 12.  On focused examination his left arm has healed up well.  There is absolutely no thrill or bruit in this arm. The right arm continues to have an active thrill and bruit in the graft. Both arms have intact sensation and hand grip.  MEDICAL DECISION MAKING:  This is a 60 year old gentleman who underwent a successful left basilic vein transposition who by report sounds like he had an inadvertent injury of the basilic vein transposition at the dialysis center and in the process of controlling the pseudoaneurysm may have thrombosed the basilic vein transposition.  At this point it is already past the window in which a thrombectomy could be successfully completed.  An attempt at thrombectomy at this  point may result in embolization of organized clot to the heart and lungs so I do not recommend proceeding forward with such.  Unfortunately this was the patient's last fistula option in his arms so once the right graft occludes he will need to have placement of a left-sided graft. Unfortunately this graft on the right side demonstrated on the shuntogram some innominate vein stenoses that responded to an Atlas balloon.  However, repeated angioplasty and high pressure balloons in innominate vein are not recommended due to risk of rupture.  He is aware of this.  We discussed that if this should fail eventually then at that point we will place a graft in his left arm.    Conrad Merrillville, MD Electronically Signed  BLC/MEDQ  D:  02/23/2011  T:  02/27/2011  Job:  [id number]  cc:   Windy Kalata, M.D.

## 2011-03-07 IMAGING — XA IR ANGIO AV SHUNT ADDL ACCESS
1 series · 14 of 24 positions shown · IV contrast (IODINE)
Comparison: none

CLINICAL HISTORY: End-stage renal disease and clotted right upper
extremity graft

[Series 300: dialysis · 14 of 37 slices shown]
[im 1/37]
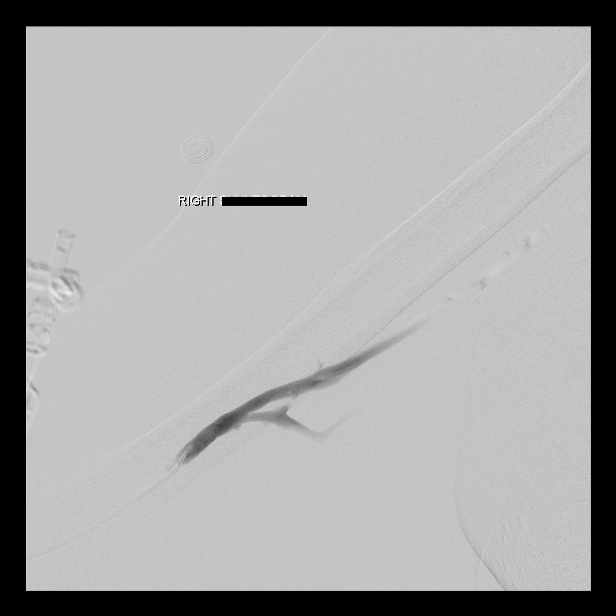
[im 4/37]
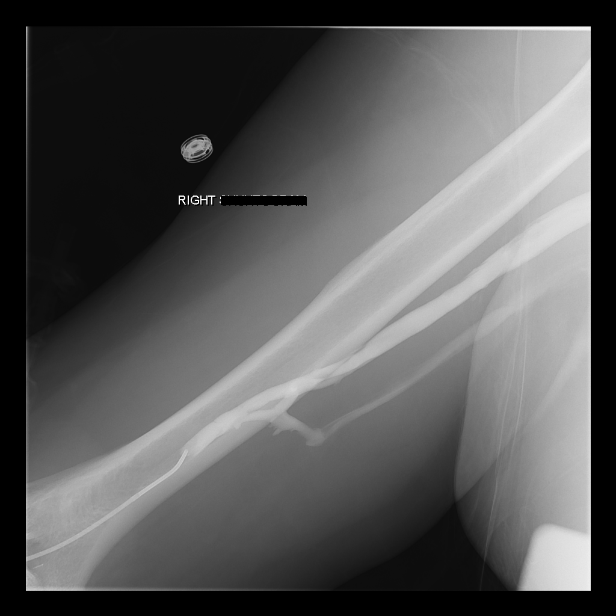
[im 7/37]
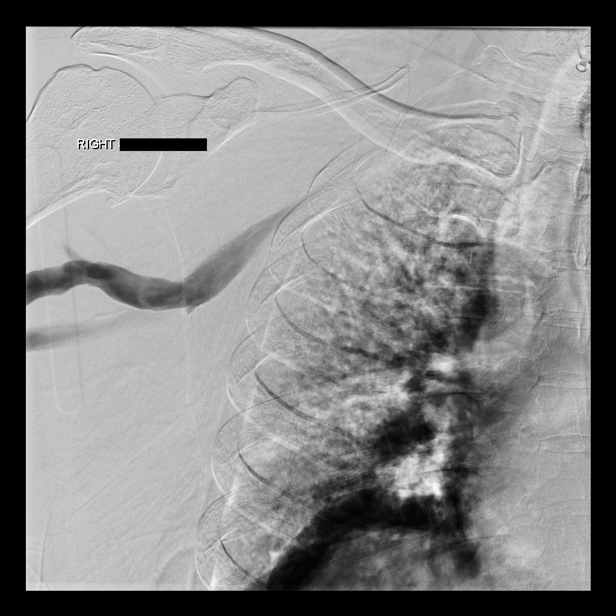
[im 10/37]
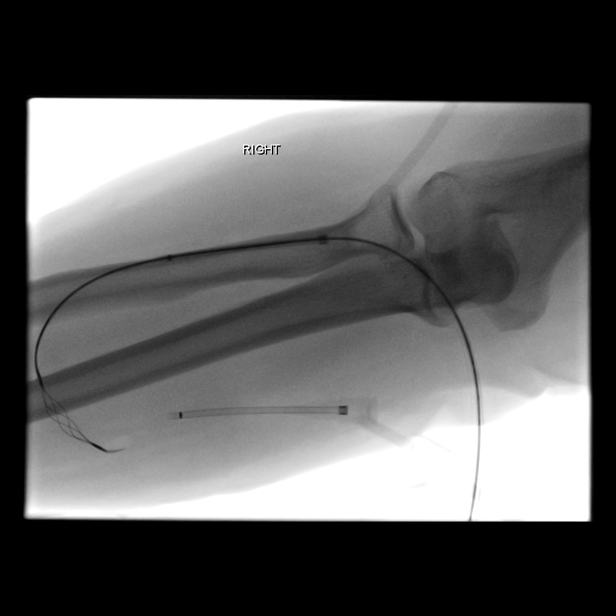
[im 11/37]
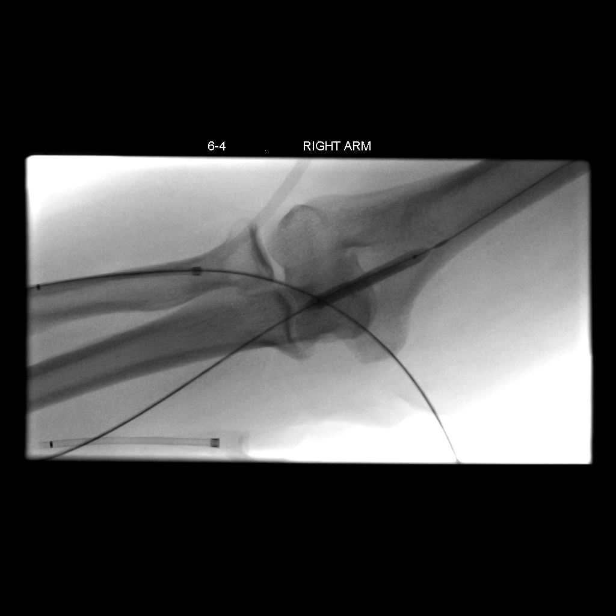
[im 15/37]
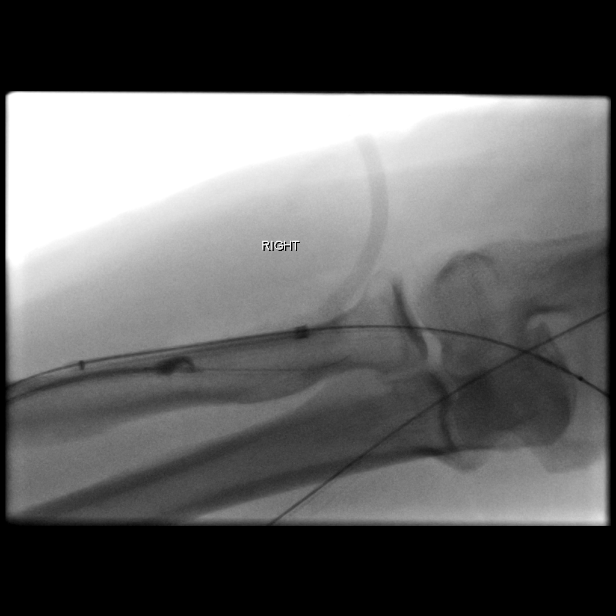
[im 18/37]
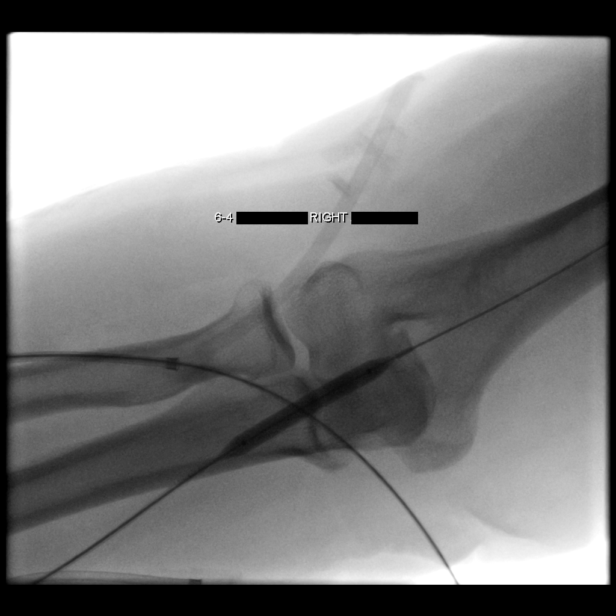
[im 19/37]
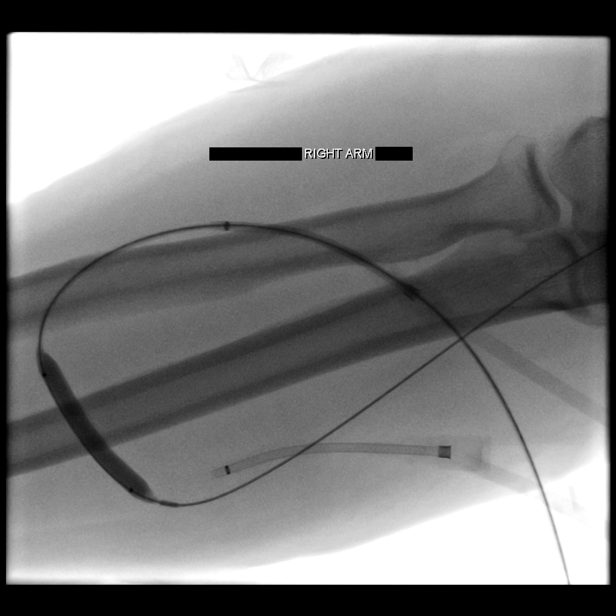
[im 22/37]
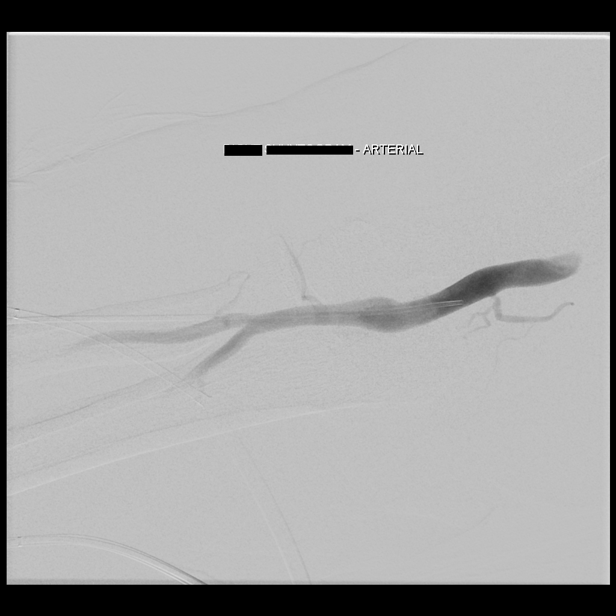
[im 26/37]
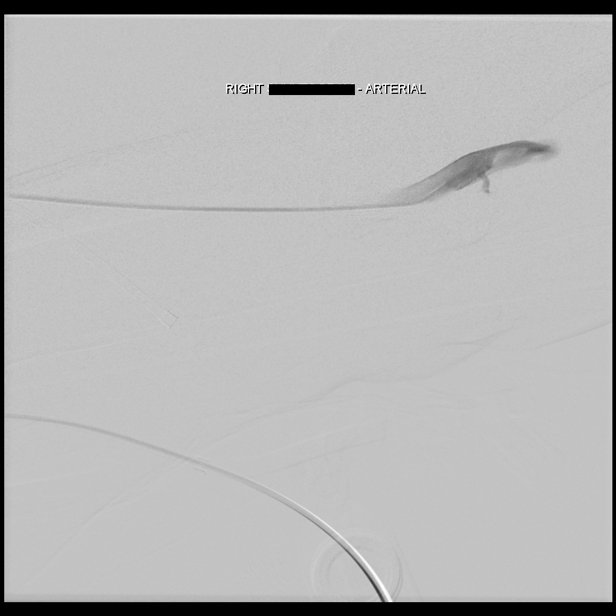
[im 29/37]
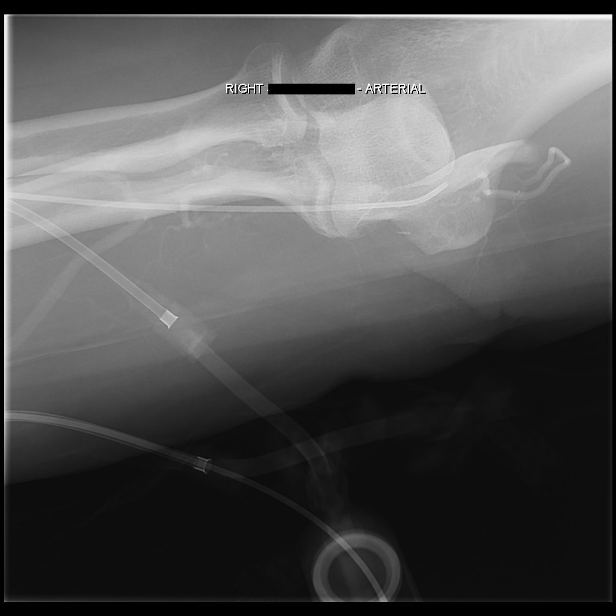
[im 30/37]
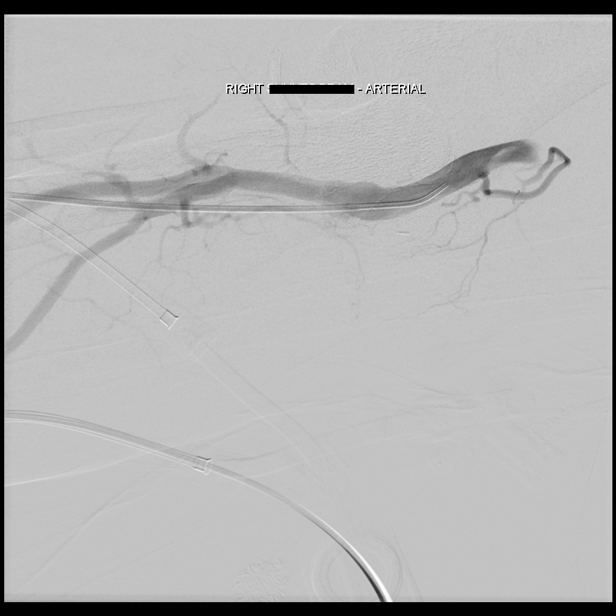
[im 33/37]
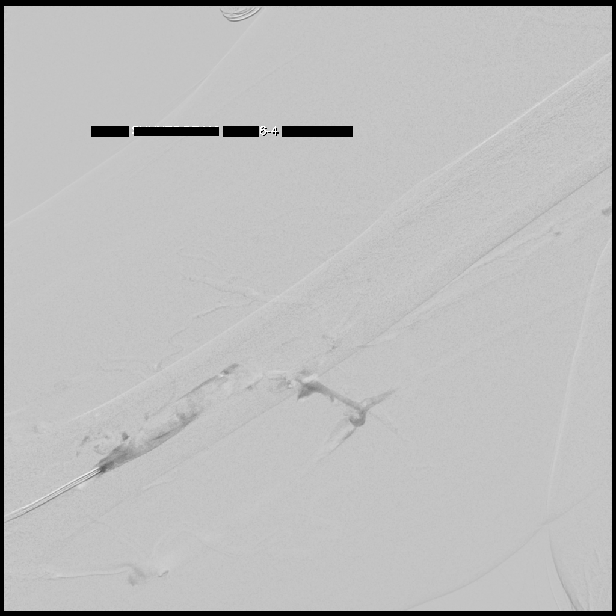
[im 37/37]
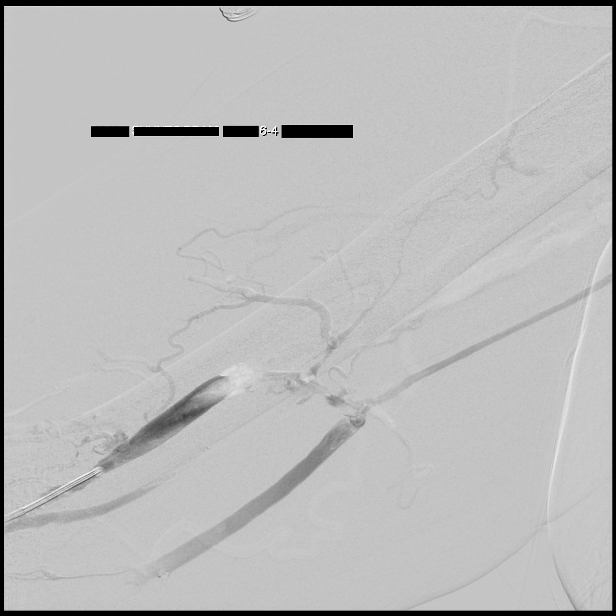

[14 of 24 positions shown; findings below may reference images not displayed]

PROCEDURE(S): DIALYSIS GRAFT DECLOT; GRAFT/VENOUS PTA; ULTRASOUND
GUIDANCE FOR VASCULAR ACCESS

Medications:Heparin 4000 units, TPA 2 mg

Fluoroscopy time: 9.5 minutes

Contrast: 55 ml Omnipaque 300

Procedure:Informed consent was obtained for a declot procedure.
The patient understood the risks of pulmonary embolism.  The right
forearm was prepped and draped in a sterile fashion.  Maximal
barrier sterile technique was utilized including caps, mask,
sterile gowns, sterile gloves, sterile drape, hand hygiene and skin
antiseptic.  The skin was anesthetized with 1% lidocaine.  The
graft was accessed using 21 gauge needles towards the venous and
arterial anastomoses with ultrasound guidance.  Ultrasound images
were obtained for documentation. Micropuncture catheters were
placed.  2 mg of TPA was infused through the micropuncture
catheters.  The vascular access pointing towards the central veins
was upsized to a 6-French vascular sheath.  A 5-French catheter was
advanced into the central venous structures and a central venogram
was performed.  Fluoroscopic images were saved for documentation.
The catheter pointing toward the arterial anastomosis was exchanged
for a 6-French sheath.  The graft was treated with the Arrow PTD
thrombectomy device.  The venous anastomosis was angioplastied with
a 6 mm x 40 mm balloon.  A wire was advanced into the arterial
system.  The arterial plug was pulled using a 5.5 Fogarty balloon.
There was minimal flow in the graft.  The arterial plug was pulled
multiple times but there was minimal flow in the graft.  Ultrasound
suggested that the arterial anastomosis was patent but there was
very little flow into the graft by angiography.  The venous
anastomosis was treated again with the 6 mm x 40 mm balloon.  The
PTD device was again used within the graft.  Flow could never be
reestablished in the graft.  Final venogram demonstrate evidence of
clot propagating into the right upper arm veins.  The vascular
sheaths were removed with purse string sutures.
FINDINGS: Patent central veins.  There was propagation of clot into
the right brachial vein at the end of the procedure.  Unable to
reestablish flow in the graft.  The arterial system appeared to be
patent.
IMPRESSION: Unsuccessful declot of the right upper extremity graft.
The procedure was discussed with Dr. Erxleben and recommended
surgical thrombectomy or revision.

## 2011-03-07 NOTE — Op Note (Signed)
[name], [name]                 ACCOUNT NO.:  [id number]  MEDICAL RECORD NO.:  [id number]           PATIENT TYPE:  O  LOCATION:  SDSC                         FACILITY:  [facility]  PHYSICIAN:  Theotis Burrow IV, MDDATE OF BIRTH:  03/30/1951  DATE OF PROCEDURE:  02/21/2011 DATE OF DISCHARGE:                              OPERATIVE REPORT   PREOPERATIVE DIAGNOSIS:  End-stage renal disease with poor access flow rates.  POSTOPERATIVE DIAGNOSIS:  End-stage renal disease with poor access flow rates.  PROCEDURES PERFORMED: 1. Ultrasound access, right forearm arteriovenous graft. 2. Dialysis graft study. 3. Percutaneous transluminal angioplasty venous, right cephalic vein. 4. Percutaneous transluminal angioplasty, right innominate vein. 5. Followup x2.  SURGEON: 1. Annamarie Major IV, MD  PROCEDURE IN DETAIL:  The patient was identified in the holding, taken to room 8, and placed supine on the table.  The right arm was prepped and draped in the usual fashion.  Time-out was called.  The right forearm graft was evaluated with ultrasound and found to be patent.  A digital ultrasound image was obtained.  The right forearm graft was then accessed.  Under ultrasound guidance with micropuncture needle, an 0.018 wire was advanced without resistance and the micropuncture sheath was placed.  Contrast injections were then performed through the sheath. The arterial anastomosis was evaluated during the intervention with the balloon inflated.  FINDINGS:  The arterial anastomosis is widely patent.  The forearm graft is widely patent.  There were two focal high-grade stenoses within the outflow vein, first in the level of the antecubital crease and the other a little more central.  There is also a stenosis within the right innominate vein with multiple collaterals in this area.  At this point in time, a decision was made to intervene.  Over an 0.035 wire, a 7- French sheath was placed.  I selected  a 7 x 40 Abbott balloon and performed balloon angioplasty on the two areas of stenosis within the outflow vein taking the balloon to nominal pressure and holding it up for 39 seconds.  Completion study was then performed which showed significant improvement in the stenosis within the outflow tract.  I then turned my attention towards the central venous system.  In order to get central access, I had to use a Berenstein II catheter to get the wire into the right atrium.  I then used a 10 x 40 balloon taken to burst pressure and held out for 30 seconds.  Completion study revealed improved flow through this area with decreased collateral visualization, however, there was still luminal irregularity.  Therefore, I elected to upsize to a 12 x 40 Atlas  balloon taking this to 12 atmospheres and holding it up for 30 seconds.  Final study was then performed which showed significant improvement in the central venous stenosis.  At this point, catheters, balloon, and wires were removed.  The sheath was removed using a Woggle technique.  There were no complications.  IMPRESSION: 1. Resolution of venous outflow tract vein stenosis using a 7 x 40     Armada balloon. 2. Central venous stenosis within the right  innominate vein,     successfully treated using an Atlas 12 x 40 balloon.     [name] Abrahams, MD     VWB/MEDQ  D:  02/21/2011  T:  02/21/2011  Job:  [id number]  Electronically Signed by Orvan Falconer IV MD on 03/07/2011 01:28:16 PM

## 2011-03-08 IMAGING — RF DG ANG/EXT/UNI/OR RIGHT
1 series · 4 of 4 positions shown · non-contrast
Comparison: None.

CLINICAL DATA: Thrombosed graft

RIGHT EXTREMITY ARTERIOGRAPHY

[Series 1: run · 4 of 4 slices shown]
[im 1/4]
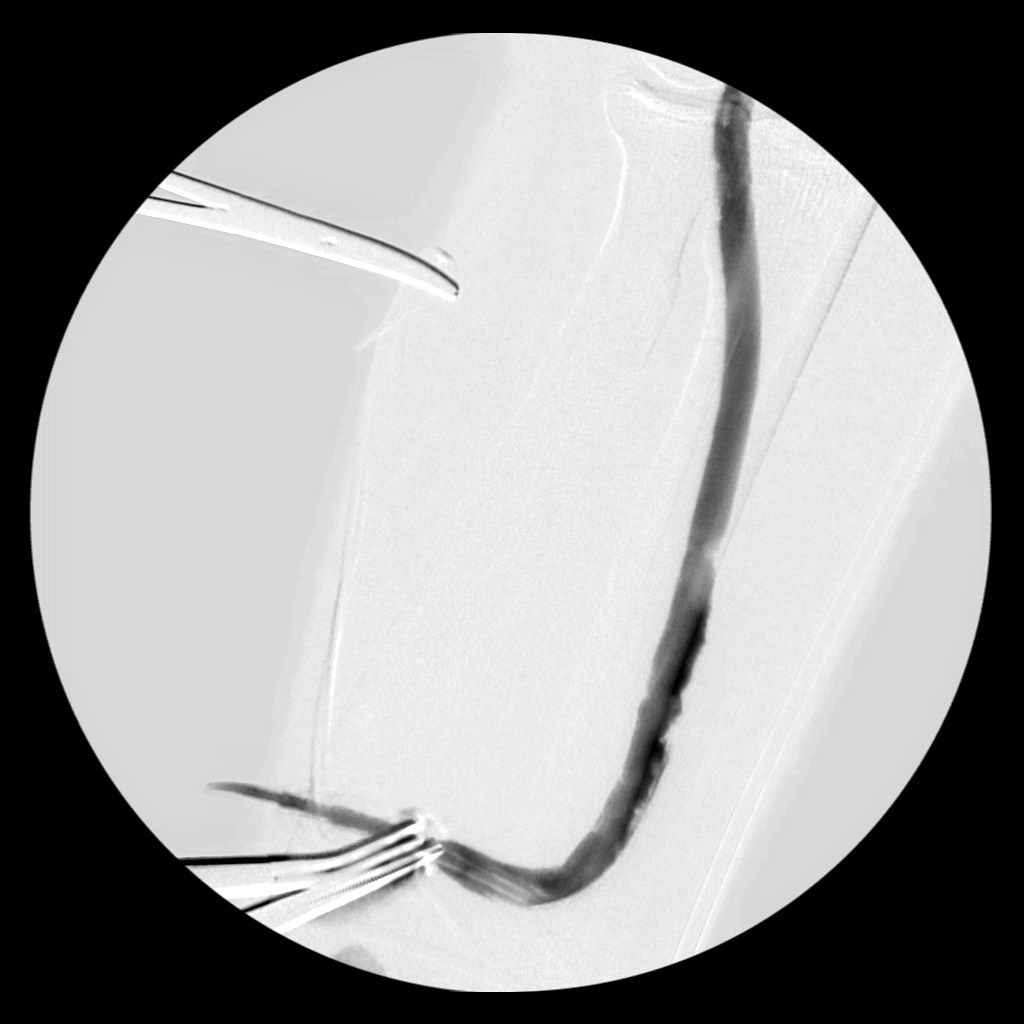
[im 2/4]
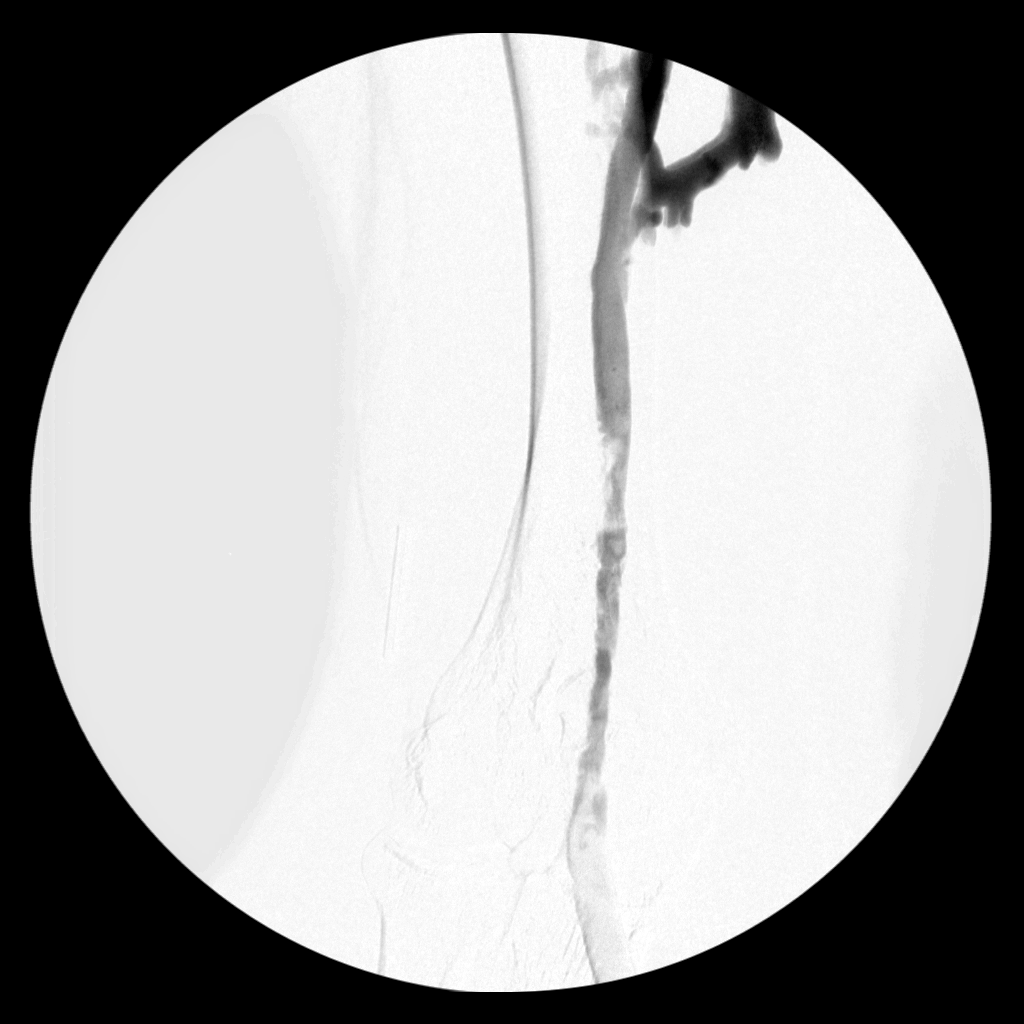
[im 3/4]
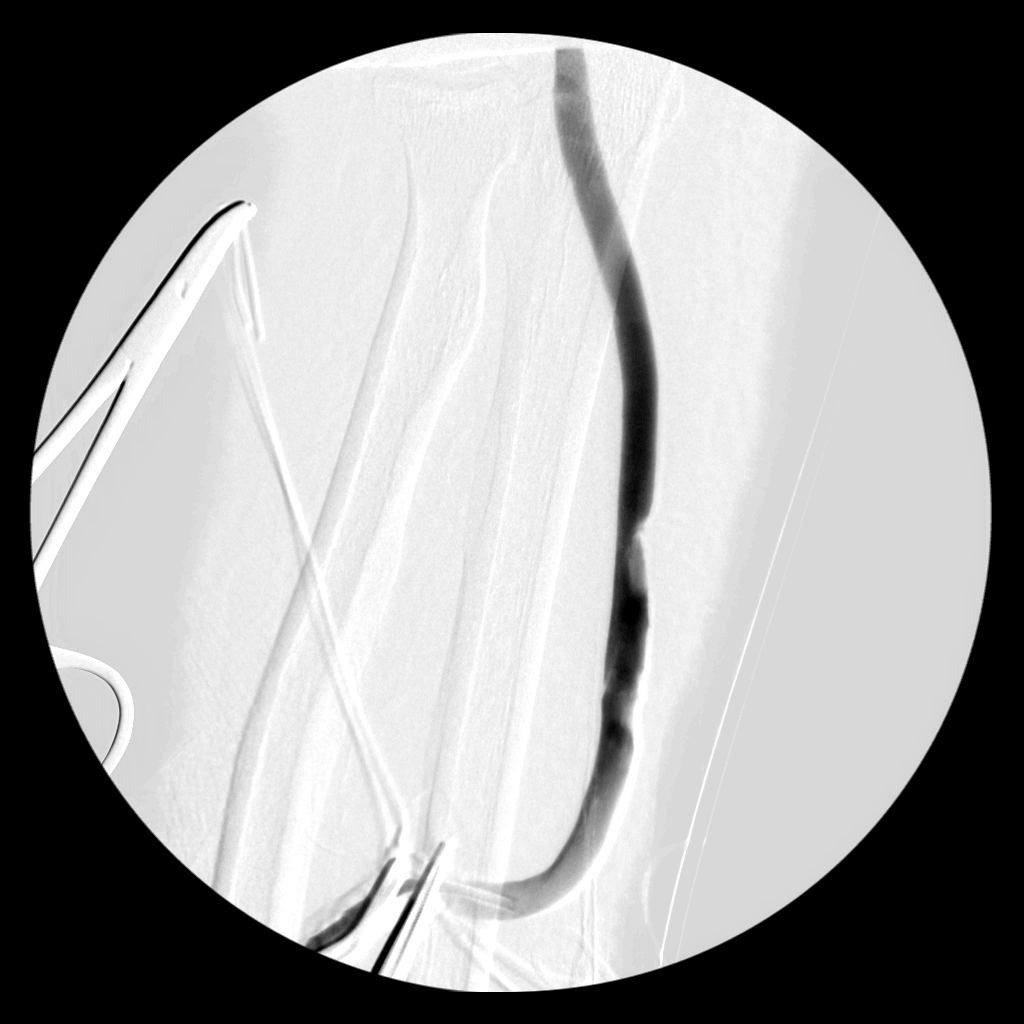
[im 4/4]
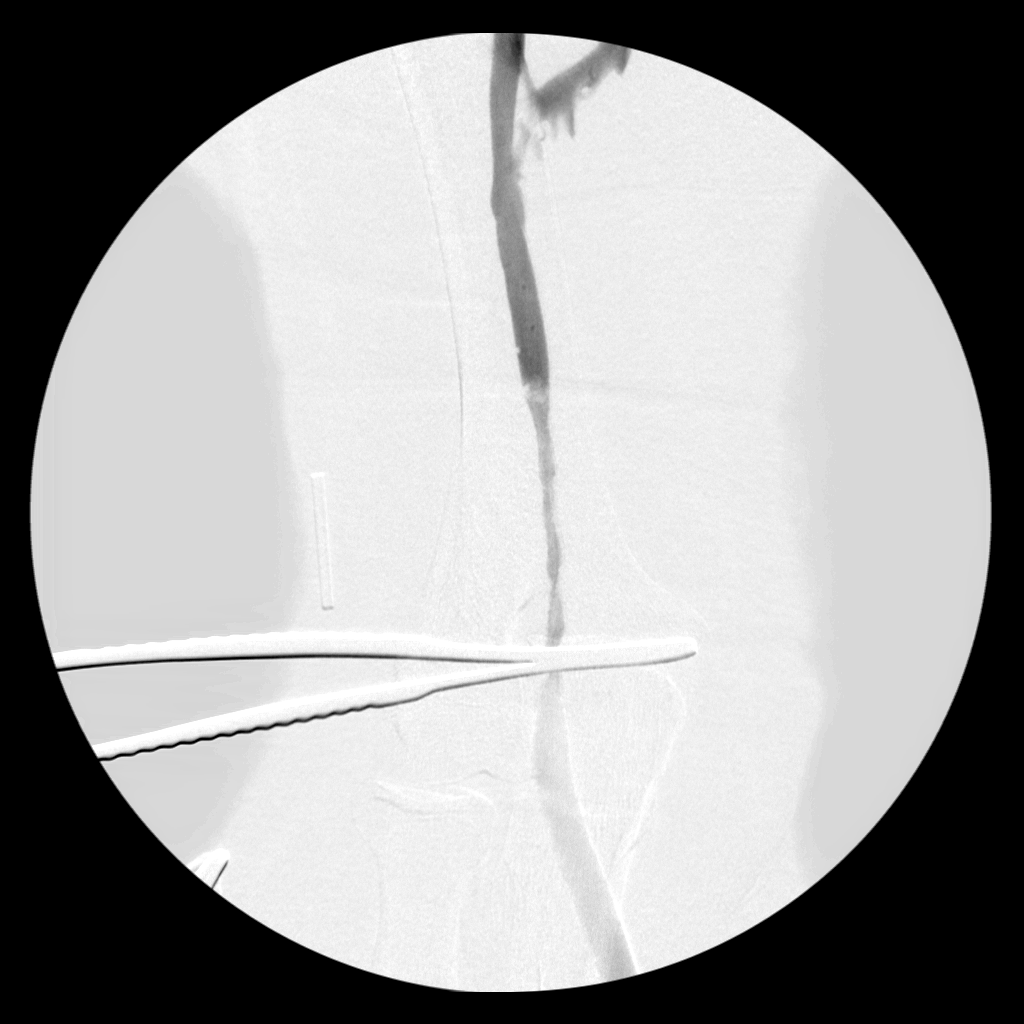

[4 of 4 positions shown; findings below may reference images not displayed]

FINDINGS: Contrast fills the AV graft.  There is irregularity and
narrowing at the venous anastomosis.
IMPRESSION: Intraoperative AV graft imaging.

## 2011-03-08 IMAGING — RF DG FLUORO GUIDE CV LINE
1 series · 1 of 1 positions shown · non-contrast
Comparison: None.

CLINICAL DATA: Clotted graft.

CV CATH FLUORO GUIDE

[Series 1: run · 1 of 1 slices shown]
[im 1/1]
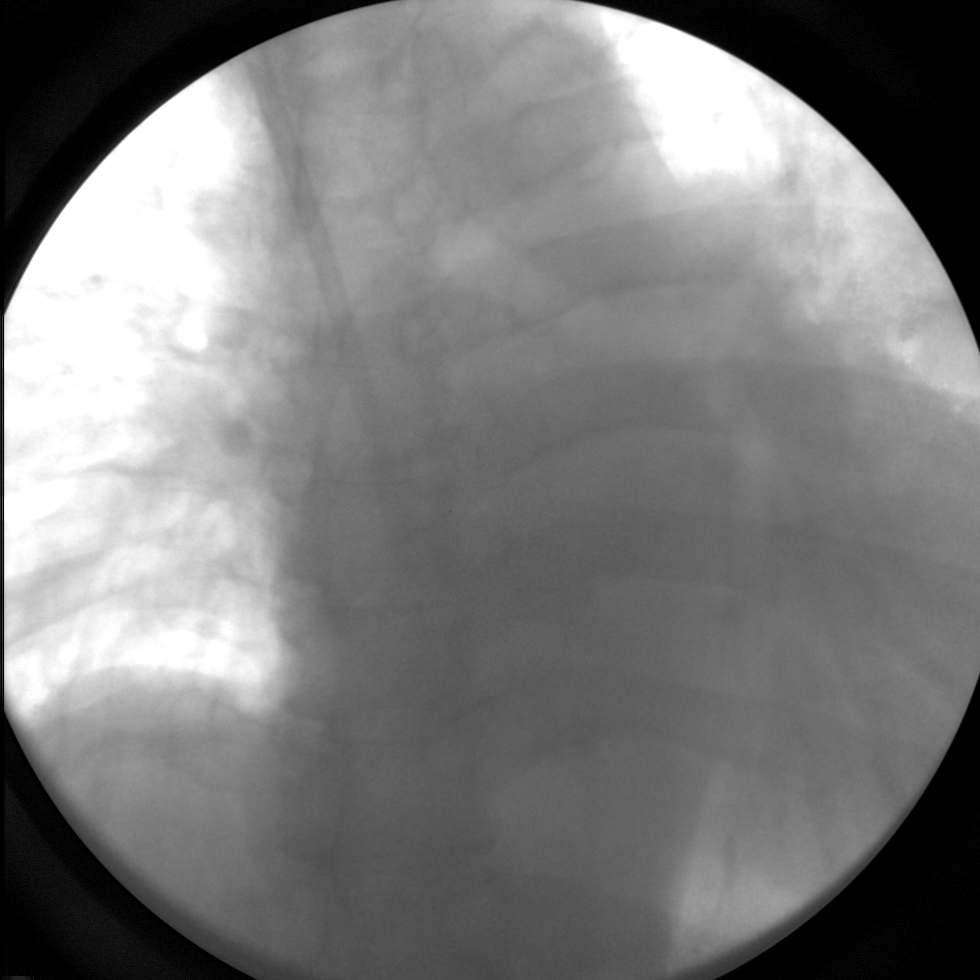

[1 of 1 positions shown; findings below may reference images not displayed]

FINDINGS: Single intraoperative fluoroscopic spot film demonstrates
a dialysis catheter projected over the cardiopericardial
silhouette.

Fluoro time was reported as 1 minute 14 seconds.
IMPRESSION: Intraoperative fluoroscopy.

## 2011-03-08 IMAGING — CR DG CHEST 1V PORT
1 series · 1 of 1 positions shown · non-contrast
Comparison: 03/29/2010.

CLINICAL DATA: Clotted graft.  Hemodialysis catheter placement.

PORTABLE CHEST - 1 VIEW

[view not recorded]
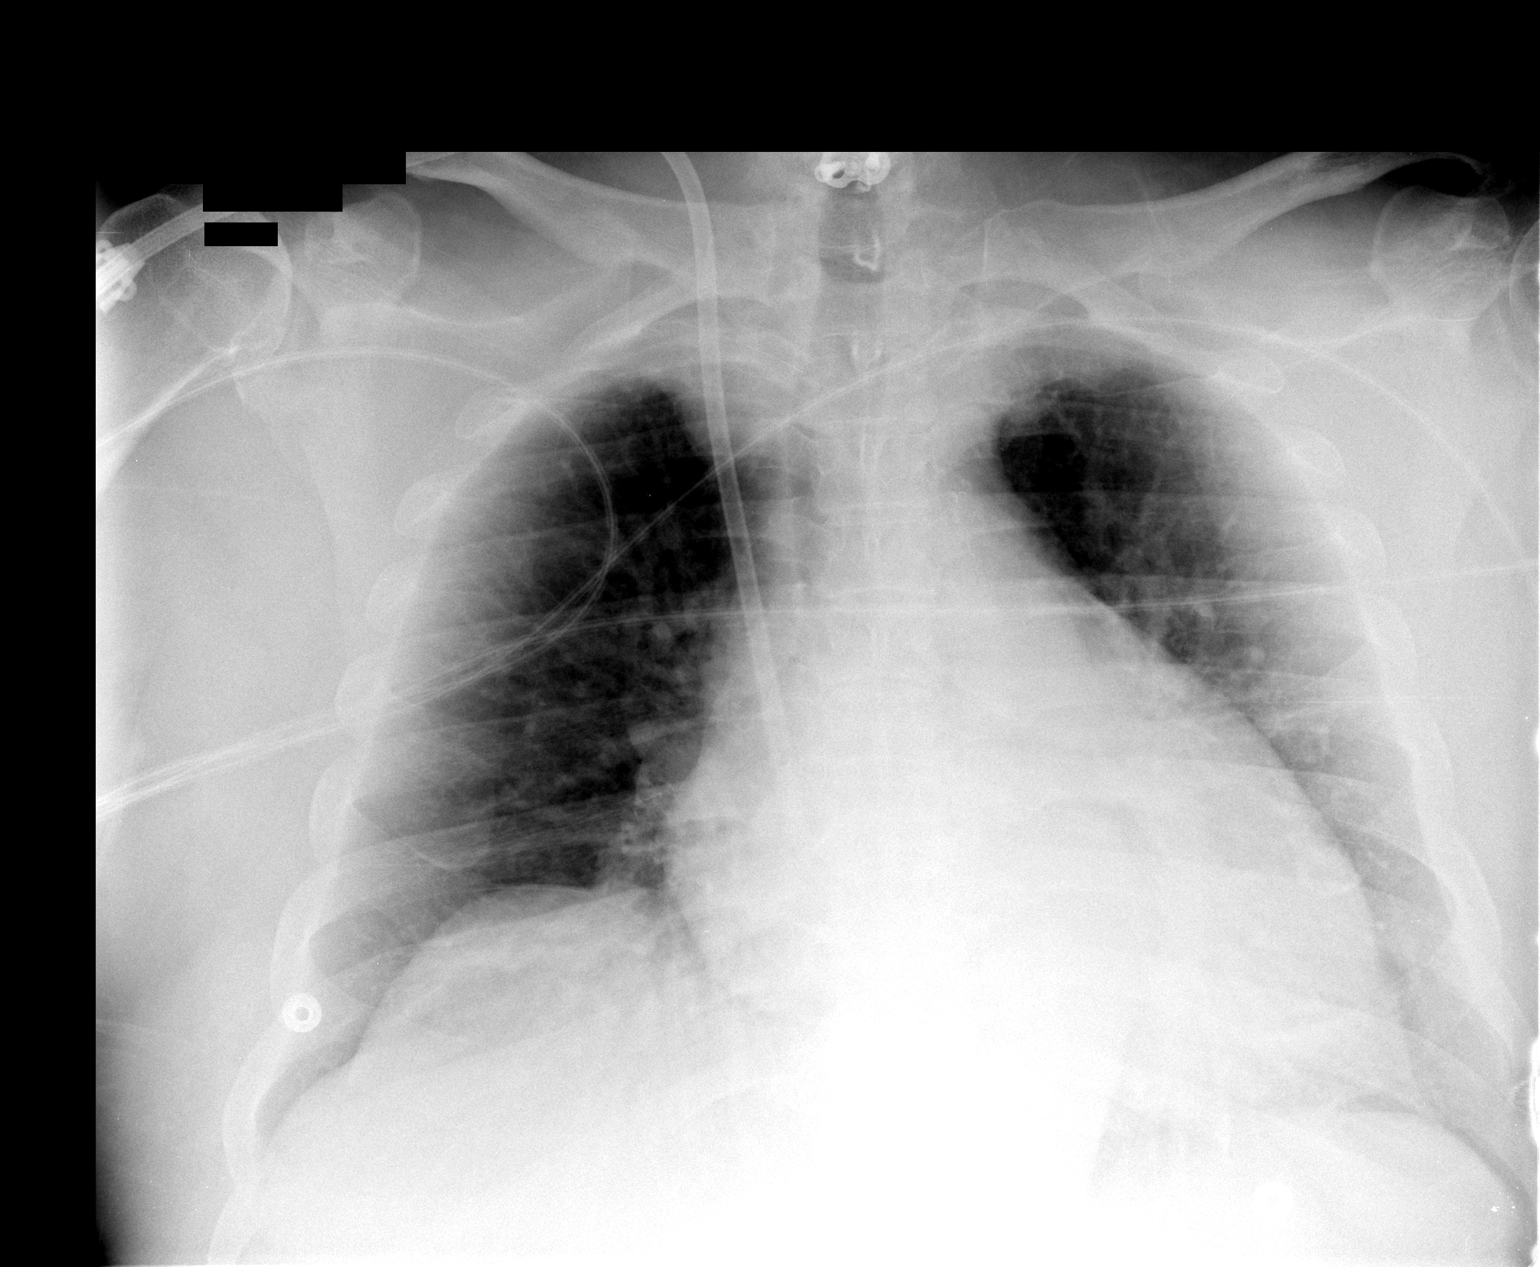

[1 of 1 positions shown; findings below may reference images not displayed]

FINDINGS: Apical lordotic projection.  Cardiomegaly.  New right IJ
dialysis catheter with the tip terminating in the right atrium.  No
airspace disease or effusion. No pneumothorax.
IMPRESSION: Uncomplicated new right dialysis catheter.  Cardiomegaly.

## 2011-03-11 ENCOUNTER — Encounter: Payer: Self-pay | Admitting: Internal Medicine

## 2011-03-11 DIAGNOSIS — Z0000 Encounter for general adult medical examination without abnormal findings: Secondary | ICD-10-CM | POA: Insufficient documentation

## 2011-03-16 ENCOUNTER — Encounter: Payer: Self-pay | Admitting: Internal Medicine

## 2011-03-16 ENCOUNTER — Ambulatory Visit (INDEPENDENT_AMBULATORY_CARE_PROVIDER_SITE_OTHER): Payer: Medicare HMO | Admitting: Internal Medicine

## 2011-03-16 VITALS — BP 120/72 | HR 102 | Temp 98.2°F | Ht 73.0 in | Wt 250.5 lb

## 2011-03-16 DIAGNOSIS — F068 Other specified mental disorders due to known physiological condition: Secondary | ICD-10-CM

## 2011-03-16 DIAGNOSIS — M542 Cervicalgia: Secondary | ICD-10-CM

## 2011-03-16 DIAGNOSIS — Z0000 Encounter for general adult medical examination without abnormal findings: Secondary | ICD-10-CM

## 2011-03-16 DIAGNOSIS — I10 Essential (primary) hypertension: Secondary | ICD-10-CM

## 2011-03-16 MED ORDER — TIZANIDINE HCL 4 MG PO TABS: 4.0000 mg | ORAL_TABLET | Freq: Three times a day (TID) | ORAL | Status: DC

## 2011-03-16 NOTE — Patient Instructions (Signed)
Please stop the flexeril Take all new medications as prescribed - the generic zanaflex Continue all other medications as before Please keep your appointments with your specialists as you have planned - the dialysis, Cardiology, and Dr Loanne Drilling Please return in 3 mo with Lab testing done 3-5 days before

## 2011-03-16 NOTE — Assessment & Plan Note (Signed)
C/w recurrent MSK +/-  undelrying DJD/DDD - for change of muscle relaxer, exam o/w stable,  to f/u any worsening symptoms or concerns

## 2011-03-16 NOTE — Progress Notes (Signed)
Subjective:    Patient ID: [name], male    DOB: Apr 10, 1951, 60 y.o.   MRN: [id number]  HPI  Here to f/u;  Overall ok, c/o left neck spasm recurrent for months - asks for flexeril change.   Pt continues to have recurring neck pain without change in severity, bowel or bladder change, fever, wt loss,  worsening extremity pain/numbness/weakness, gait change or falls. Pt denies chest pain, increased sob or doe, wheezing, orthopnea, PND, increased LE swelling, palpitations, dizziness or syncope.  Pt denies new neurological symptoms such as new headache, or facial or extremity weakness or numbness   Pt denies polydipsia, polyuria, or low sugar symptoms such as weakness or confusion improved with po intake.  Pt states overall good compliance with meds, trying to follow lower cholesterol, diabetic diet, wt overall stable but little exercise however.     Insulin stopped and recent a1c normal per endo. Dementia overall stable symptomatically with gradual worsening at best, and not assoc with behavioral changes such as hallucinations, paranoia, or agitation. Past Medical History  Diagnosis Date  . Hyperthyroidism   . GASTROENTERITIS, VIRAL 10/14/2009  . ONYCHOMYCOSIS, TOENAILS 12/26/2007  . GOITER, MULTINODULAR 12/26/2007  . HYPERTHYROIDISM 02/02/2010  . DIABETES MELLITUS, TYPE II 02/01/2010  . DYSLIPIDEMIA 06/18/2007  . GOUT 06/18/2007  . Hypocalcemia 06/07/2010  . DEMENTIA 09/02/2009  . DEPRESSION 10/14/2009  . PERIPHERAL NEUROPATHY 06/18/2007  . HYPERTENSION 06/18/2007  . CAD, NATIVE VESSEL 02/06/2009  . CONGESTIVE HEART FAILURE 06/18/2007  . DIASTOLIC HEART FAILURE, CHRONIC 02/06/2009  . Unspecified hypotension 01/30/2010  . PULMONARY NODULE, RIGHT LOWER LOBE 06/08/2009  . GERD 06/18/2007  . ESRD 08/04/2010  . BENIGN PROSTATIC HYPERTROPHY 10/14/2009  . GYNECOMASTIA 07/17/2010  . NECK PAIN 07/31/2010  . FOOT PAIN 08/12/2008  . DIZZINESS 07/17/2010  . Other malaise and fatigue 11/24/2009  . GAIT DISTURBANCE  03/03/2010  . DYSPNEA 10/29/2008  . CHEST PAIN 03/29/2010  . TRANSAMINASES, SERUM, ELEVATED 02/01/2010  . COLONIC POLYPS, HX OF 10/14/2009   Past Surgical History  Procedure Date  . Stent 06/11/08   . Neck sugury 2/09  . Back surgury 1998    reports that he has quit smoking. He does not have any smokeless tobacco history on file. He reports that he does not use illicit drugs. His alcohol history not on file. family history includes Heart disease in his father and sister. Allergies  Allergen Reactions  . Cephalexin   . Statins     REACTION: perceived myalgias   Current Outpatient Prescriptions on File Prior to Visit  Medication Sig Dispense Refill  . allopurinol (ZYLOPRIM) 100 MG tablet Take 100 mg by mouth daily.        [name] aspirin 81 MG EC tablet Take 81 mg by mouth daily.        [name] atorvastatin (LIPITOR) 10 MG tablet 1 by mouth every other day       . calcium-vitamin D (OSCAL WITH D) 500-200 MG-UNIT per tablet 2 by mouth three times per week       . carvedilol (COREG) 12.5 MG tablet Take 12.5 mg by mouth 2 (two) times daily.        . clopidogrel (PLAVIX) 75 MG tablet Take 75 mg by mouth daily.        . Colesevelam HCl (WELCHOL) 3.75 G PACK 1 pack by mouth in water once daily       . cyclobenzaprine (FLEXERIL) 10 MG tablet 1 tab x a day as needed for pain  90 tablet  11  . epoetin alfa (EPOGEN) 3000 UNIT/ML injection Inject 3,000 Units into the skin 3 (three) times a week. At dialysis       . gabapentin (NEURONTIN) 300 MG capsule Take 300 mg by mouth at bedtime as needed.        . methimazole (TAPAZOLE) 5 MG tablet 1 tablet 2 times a week       . multivitamin (RENA-VIT) TABS tablet Take 1 tablet by mouth daily.        . Omega-3 Fatty Acids (FISH OIL) 1000 MG CAPS Take by mouth 3 (three) times daily.        . ranitidine (ZANTAC) 300 MG capsule Take 300 mg by mouth daily.        [name] ezetimibe (ZETIA) 10 MG tablet Take 10 mg by mouth daily.        [name] DISCONTD: cinacalcet (SENSIPAR) 90 MG tablet  Take 90 mg by mouth daily.         Review of Systems Review of Systems  Constitutional: Negative for diaphoresis and unexpected weight change.  HENT: Negative for drooling and tinnitus.   Eyes: Negative for photophobia and visual disturbance.  Respiratory: Negative for choking and stridor.   Gastrointestinal: Negative for vomiting and blood in stool.  Genitourinary: Negative for hematuria and decreased urine volume.  Musculoskeletal: Negative for gait problem.       Objective:   Physical Exam BP 120/72  Pulse 102  Temp(Src) 98.2 F (36.8 C) (Oral)  Ht 6\' 1"  (1.854 m)  Wt 250 lb 8 oz (113.626 kg)  BMI 33.05 kg/m2  SpO2 94% Physical Exam  VS noted Constitutional: Pt appears well-developed and well-nourished.  HENT: Head: Normocephalic.  Right Ear: External ear normal.  Left Ear: External ear normal.  Eyes: Conjunctivae and EOM are normal. Pupils are equal, round, and reactive to light.  Neck: Normal range of motion. Neck supple.  Cardiovascular: Normal rate and regular rhythm.   Pulmonary/Chest: Effort normal and breath sounds normal.  Abd:  Soft, NT, non-distended, + BS Neurological: Pt is alert. No cranial nerve deficit. motor/dtr's intact Skin: Skin is warm. No erythema. No edema Psychiatric: Pt behavior is normal. Thought content c/w dementia, wife with him today Spine and paraspinal nontender today       Assessment & Plan:

## 2011-03-16 NOTE — Assessment & Plan Note (Signed)
stable overall by hx and exam, most recent data reviewed with pt, and pt to continue medical treatment as before  BP Readings from Last 3 Encounters:  03/16/11 120/72  02/12/11 134/82  12/08/10 120/72

## 2011-03-16 NOTE — Assessment & Plan Note (Signed)
stable overall by hx and exam, most recent data reviewed with pt, and pt to continue medical treatment as before  Lab Results  Component Value Date   WBC 9.0 08/12/2010   HGB 11.9* 02/21/2011   HCT 35.0* 02/21/2011   PLT 265 08/12/2010   CHOL 333* 11/10/2010   TRIG 293.0* 11/10/2010   HDL 47.00 11/10/2010   LDLDIRECT 221.4 11/10/2010   ALT 22 08/12/2010   AST 25 08/12/2010   NA 139 02/21/2011   K 4.9 02/21/2011   CL 107 02/21/2011   CREATININE 11.30* 02/21/2011   BUN 44* 02/21/2011   CO2 33* 08/12/2010   TSH 0.83 02/12/2011   PSA 1.98 05/05/2010   INR 0.97 03/22/2010   HGBA1C 6.2 02/12/2011

## 2011-03-29 IMAGING — CT CT CHEST W/O CM
1 of 5 series · 4 of 36 positions shown, 5 images · non-contrast
Comparison: 02/14/2009

CLINICAL DATA: Followup right lower lobe nodule.

CT CHEST WITHOUT CONTRAST
TECHNIQUE: Multidetector CT imaging of the chest was performed
following the standard protocol without IV contrast.

[Series 602: <mpr thick range> · coronal · 0.84mm/px · 4 of 143 slices shown, 5 images]
[im 29/143  mediastinal]
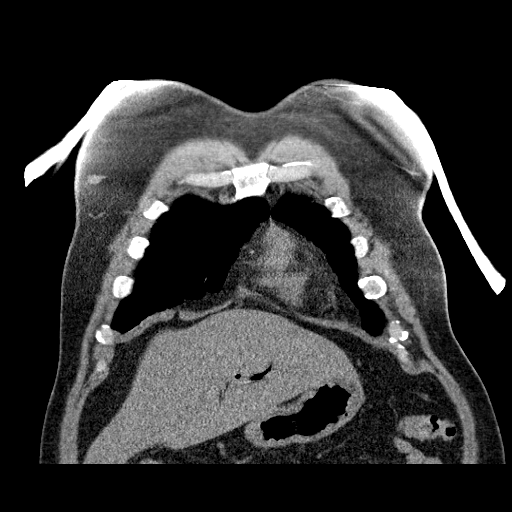
[im 29/143  lung]
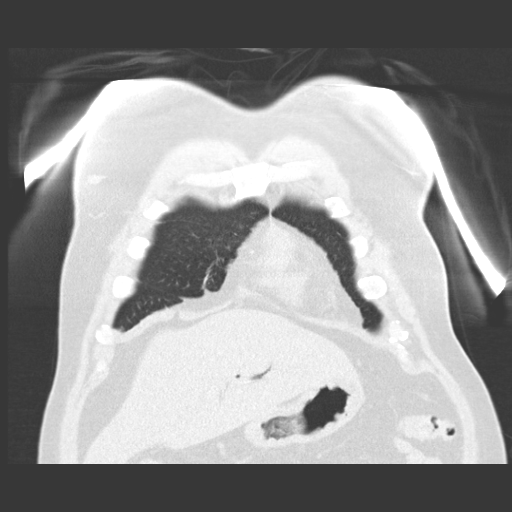
[im 57/143  lung]
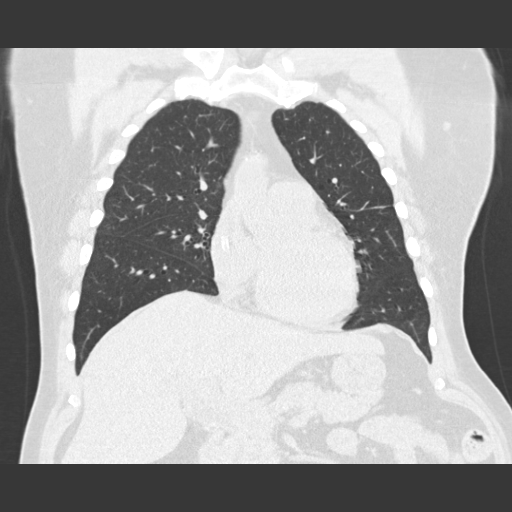
[im 86/143  lung]
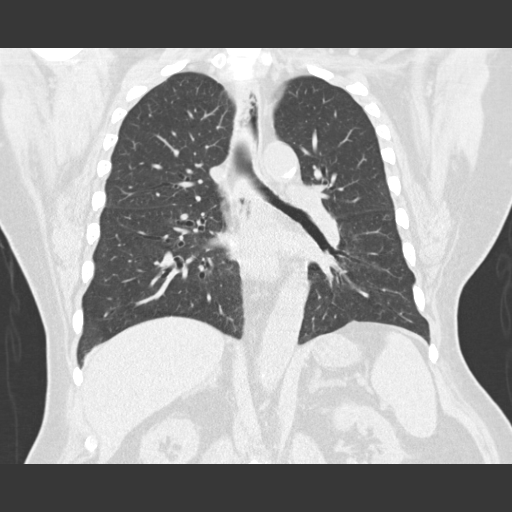
[im 114/143  lung]
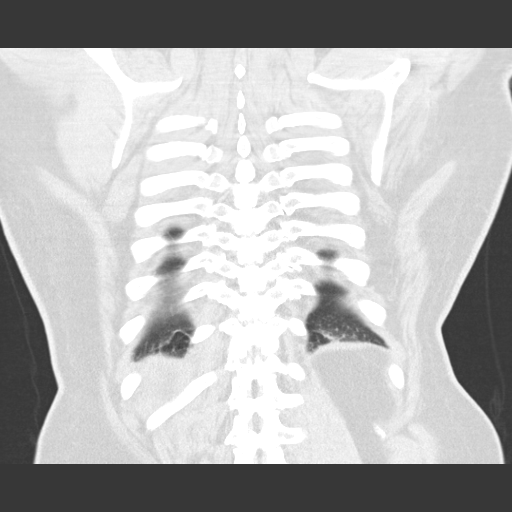

[4 of 36 positions shown; findings below may reference images not displayed]

FINDINGS: Left thyroid is mildly asymmetrically enlarged and
heterogeneous, as on 02/14/2009.  Mediastinal lymph nodes are not
enlarged by CT size criteria.  Hilar regions are difficult to
definitively evaluate without IV contrast.  No axillary adenopathy.
Atherosclerotic calcification of the arterial vasculature,
including coronary arteries.  Heart size normal.  No pericardial
effusion.  Bilateral gynecomastia.

Scattered scarring.  A 2 mm subpleural nodule in the right lower
lobe (image 39) is unchanged from 02/14/2009, and is therefore
considered benign.  No new nodules.  No pleural fluid.  Airway is
unremarkable. Inspiratory and expiratory imaging shows no evidence
of air trapping.

Incidental imaging of the upper abdomen shows pneumobilia which is
presumably iatrogenic, when compared with 03/22/2010.
Cholecystectomy.  Probable renal vascular calcifications
bilaterally.  Degenerative changes are seen in the spine.  No
worrisome lytic or sclerotic lesions.
IMPRESSION: No acute findings in the chest.

## 2011-05-05 ENCOUNTER — Inpatient Hospital Stay (HOSPITAL_COMMUNITY)
Admission: EM | Admit: 2011-05-05 | Discharge: 2011-05-10 | DRG: 377 | Disposition: A | Discharge status: Home / Self Care | Payer: Medicare HMO | Attending: Internal Medicine | Admitting: Internal Medicine

## 2011-05-05 ENCOUNTER — Emergency Department (HOSPITAL_COMMUNITY): Payer: Medicare HMO

## 2011-05-05 DIAGNOSIS — Z7982 Long term (current) use of aspirin: Secondary | ICD-10-CM

## 2011-05-05 DIAGNOSIS — K5730 Diverticulosis of large intestine without perforation or abscess without bleeding: Secondary | ICD-10-CM | POA: Diagnosis present

## 2011-05-05 DIAGNOSIS — I2589 Other forms of chronic ischemic heart disease: Secondary | ICD-10-CM | POA: Diagnosis present

## 2011-05-05 DIAGNOSIS — M109 Gout, unspecified: Secondary | ICD-10-CM | POA: Diagnosis present

## 2011-05-05 DIAGNOSIS — N186 End stage renal disease: Secondary | ICD-10-CM | POA: Diagnosis present

## 2011-05-05 DIAGNOSIS — K31819 Angiodysplasia of stomach and duodenum without bleeding: Secondary | ICD-10-CM | POA: Diagnosis present

## 2011-05-05 DIAGNOSIS — Z981 Arthrodesis status: Secondary | ICD-10-CM

## 2011-05-05 DIAGNOSIS — F3289 Other specified depressive episodes: Secondary | ICD-10-CM | POA: Diagnosis present

## 2011-05-05 DIAGNOSIS — D126 Benign neoplasm of colon, unspecified: Secondary | ICD-10-CM | POA: Diagnosis present

## 2011-05-05 DIAGNOSIS — G609 Hereditary and idiopathic neuropathy, unspecified: Secondary | ICD-10-CM | POA: Diagnosis present

## 2011-05-05 DIAGNOSIS — I2510 Atherosclerotic heart disease of native coronary artery without angina pectoris: Secondary | ICD-10-CM | POA: Diagnosis present

## 2011-05-05 DIAGNOSIS — Z9861 Coronary angioplasty status: Secondary | ICD-10-CM

## 2011-05-05 DIAGNOSIS — Z87891 Personal history of nicotine dependence: Secondary | ICD-10-CM

## 2011-05-05 DIAGNOSIS — E785 Hyperlipidemia, unspecified: Secondary | ICD-10-CM | POA: Diagnosis present

## 2011-05-05 DIAGNOSIS — D638 Anemia in other chronic diseases classified elsewhere: Secondary | ICD-10-CM | POA: Diagnosis present

## 2011-05-05 DIAGNOSIS — K922 Gastrointestinal hemorrhage, unspecified: Principal | ICD-10-CM | POA: Diagnosis present

## 2011-05-05 DIAGNOSIS — E119 Type 2 diabetes mellitus without complications: Secondary | ICD-10-CM | POA: Diagnosis present

## 2011-05-05 DIAGNOSIS — D62 Acute posthemorrhagic anemia: Secondary | ICD-10-CM | POA: Diagnosis present

## 2011-05-05 DIAGNOSIS — G4733 Obstructive sleep apnea (adult) (pediatric): Secondary | ICD-10-CM | POA: Diagnosis present

## 2011-05-05 DIAGNOSIS — K311 Adult hypertrophic pyloric stenosis: Secondary | ICD-10-CM | POA: Diagnosis present

## 2011-05-05 DIAGNOSIS — N2581 Secondary hyperparathyroidism of renal origin: Secondary | ICD-10-CM | POA: Diagnosis present

## 2011-05-05 DIAGNOSIS — F329 Major depressive disorder, single episode, unspecified: Secondary | ICD-10-CM | POA: Diagnosis present

## 2011-05-05 DIAGNOSIS — I120 Hypertensive chronic kidney disease with stage 5 chronic kidney disease or end stage renal disease: Secondary | ICD-10-CM | POA: Diagnosis present

## 2011-05-05 LAB — COMPREHENSIVE METABOLIC PANEL
ALT: 14 U/L (ref 0–53)
Albumin: 3.3 g/dL — ABNORMAL LOW (ref 3.5–5.2)
Calcium: 9.5 mg/dL (ref 8.4–10.5)
GFR calc Af Amer: 15 mL/min — ABNORMAL LOW (ref >60)
Glucose, Bld: 121 mg/dL — ABNORMAL HIGH (ref 70–99)
Sodium: 140 mEq/L (ref 135–145)
Total Protein: 7 g/dL (ref 6.0–8.3)

## 2011-05-05 LAB — PROTIME-INR
INR: 0.91 (ref 0.00–1.49)
Prothrombin Time: 12.5 seconds (ref 11.6–15.2)

## 2011-05-05 LAB — CBC
Hemoglobin: 5.8 g/dL — CL (ref 13.0–17.0)
MCH: 30.4 pg (ref 26.0–34.0)
MCHC: 31.5 g/dL (ref 30.0–36.0)
Platelets: 283 10*3/uL (ref 150–400)
RDW: 19.4 % — ABNORMAL HIGH (ref 11.5–15.5)

## 2011-05-05 LAB — OCCULT BLOOD, POC DEVICE: Fecal Occult Bld: POSITIVE

## 2011-05-05 LAB — DIFFERENTIAL
Basophils Relative: 0 % (ref 0–1)
Eosinophils Absolute: 0.2 10*3/uL (ref 0.0–0.7)
Eosinophils Relative: 2 % (ref 0–5)
Monocytes Absolute: 1 10*3/uL (ref 0.1–1.0)
Monocytes Relative: 10 % (ref 3–12)

## 2011-05-05 LAB — GLUCOSE, CAPILLARY: Glucose-Capillary: 103 mg/dL — ABNORMAL HIGH (ref 70–99)

## 2011-05-06 LAB — BASIC METABOLIC PANEL
Calcium: 9.7 mg/dL (ref 8.4–10.5)
Creatinine, Ser: 7.47 mg/dL — ABNORMAL HIGH (ref 0.50–1.35)
GFR calc Af Amer: 9 mL/min — ABNORMAL LOW (ref >60)

## 2011-05-06 LAB — CBC
MCH: 31 pg (ref 26.0–34.0)
MCV: 94.4 fL (ref 78.0–100.0)
Platelets: 244 10*3/uL (ref 150–400)
RDW: 20.1 % — ABNORMAL HIGH (ref 11.5–15.5)

## 2011-05-06 LAB — PREPARE RBC (CROSSMATCH)

## 2011-05-06 NOTE — H&P (Signed)
[name], LERSCH NO.:  [id number]  MEDICAL RECORD NO.:  [id number]  LOCATION:  [facility]                         FACILITY:  [facility]  PHYSICIAN:  Arlina Robes, MD        DATE OF BIRTH:  12-11-1950  DATE OF ADMISSION:  05/05/2011 DATE OF DISCHARGE:                             HISTORY & PHYSICAL   PRIMARY CARE PHYSICIAN:  Biagio Borg, MD  CHIEF COMPLAINT:  Fatigue.  HISTORY OF PRESENT ILLNESS:  The patient is a 60 year old male with end- stage renal disease who was found today to have a low hemoglobin while at dialysis and was referred to the emergency room for further evaluation.  The patient states that for the past 1-2 weeks, he been more fatigued with decreased energy.  He has been mildly short of breath with activity.  He has also had a melena consisting of dark stools. These have been black stools and had been going on for the past 1-2 weeks.  He denies any bright red blood per rectum, abdominal pain.  He had 1 episode of emesis that was normal in color.  He has had a little bit looser stools this past week as well.  His appetite has been normal. He has never had anything persistent like this before although he has had intermittent dark stools for 2 days in the past and he has never had a thorough workup for this.  He has not been using NSAIDs recently, but he is on aspirin and Plavix daily and he has been on this since 2008.  PAST MEDICAL HISTORY: 1. End-stage renal disease secondary to diabetes, on chronic dialysis     since 2009.  He sees [facility] for his dialysis needs.  Darrold Span. Florene Glen, MD is his primary nephrologist.  He gets dialysis     Tuesday, Thursday, and Saturday, with his last session being today,     he said it was a normal session.  He has a right forearm AV graft. 2. Hypertension. 3. Diabetes type 2, non-insulin dependent. 4. Morbid obesity. 5. Depression. 6. CAD status post Taxus drug-eluting stent to the RCA in 2008,  followed by [facility].  No stents since 2008. 7. History of mildly reduced EF, which has been diagnosed with     ischemic cardiomyopathy. 8. History of anemia of chronic disease. 9. History of secondary hyperparathyroidism. 10.Obstructive sleep apnea, on CPAP. 11.History of gout. 12.History of peripheral neuropathy. 13.History of multinodular goiter. 14.History of hyperlipidemia.  FAMILY HISTORY:  Reviewed, noncontributory.  SOCIAL HISTORY:  The patient lives in [place] with his wife.  He is disabled since 2004.  He smoked for 20 plus years in the past, but quit several years ago.  He denies any alcohol use.  REVIEW OF SYSTEMS:  A 12-point review of systems was reviewed with the patient and is as per HPI, otherwise negative.  He denies presyncope or syncope.  CODE STATUS:  Full code.  ALLERGIES:  KEFLEX, AMPICILLIN, and STATINS, although he is on Lipitor and seems to be tolerating that currently.  Current medications list which is per the patient's list that was sent him with  today from dialysis and which I confirmed with him today; 1. Sensipar 60 mg twice daily. 2. Lipitor 10 mg daily. 3. Methimazole 5 mg 2 times per week (unclear what this is exactly,     this may be a spelling error). 4. WelChol. 5. Amitriptyline 25 mg once daily. 6. Mentax (again unclear what this is). 7. Coreg 12.5 mg twice daily, on non-HD days. 8. FiberCon. 9. Ethyl chloride spray. 10.Viagra as needed. 11.Fish oil. 12.Allopurinol 100 mg a day. 13.Neurontin 300 mg a day. 14.Ultram 50 mg as needed. 15.Os-Cal 500 mg 3 times daily. 16.Ranitidine 300 mg daily. 17.Plavix 75 mg daily. 18.Aspirin 81 mg daily. 19.Nephro-Vite once daily.  PHYSICAL EXAMINATION:  VITAL SIGNS:  Upon admission, afebrile, blood pressure 123/61, pulse 85, respiratory rate 18, oxygen saturation 94% on room air. GENERAL/CONSTITUTIONAL:  Obese male, in no acute distress. HEENT:  Moist mucous membranes. Sclerae are  pale. NECK:  Supple.  No JVD. CARDIOVASCULAR:  Regular rate and rhythm.  It is soft, slow sounding, 1/6 systolic murmur at the left upper sternal border.  No S3, no S4. PULMONARY:  Clear to auscultation bilaterally. ABDOMEN:  Soft, nontender. EXTREMITIES:  Warm, well-perfused.  No cyanosis, clubbing, or edema x4. His right AV fistula has a good thrill.  He has 2+ radial pulses on his left, 1+ on his right, and his PT and DP pulses are nonpalpable, but his feet are warm with no evidence of malperfusion. NEUROLOGIC:  Alert and oriented x3. PSYCHIATRIC:  Appropriate. SKIN:  No rash. RECTAL:  Per the ER physician, guaiac-positive stools.  No gross blood.  LABORATORY DATA:  BMP with a creatinine of 4.9, otherwise grossly unremarkable.  CBC showed a hemoglobin of 5.8 and hematocrit 18.4. Indices were normal except for RDW slightly high at 19.4.  Other counts including platelets and white blood cell count were normal.  Coags were normal.  Looking through his dialysis notes, the patient had a CBC done on April 26, 2011, with hemoglobin of 9.5, hematocrit of 30.3.  ASSESSMENT: 1. Acute presumably blood loss anemia, causing his fatigue and     dyspnea. 2. Melena, likely due to gastrointestinal bleed, suspect upper,     possibly a peptic ulcer disease. 3. History of end-stage renal disease. 4. History of hypertension. 5. History of coronary artery disease status post Taxus drug-eluting     stent in 2008, to his right coronary artery. 6. History of diabetes.  PLAN: 1. The patient's vital signs were stable.  There is no evidence of     hypovolemia from an acutely bleeding ulcer.  We will of course     admit him to the hospital.  We will keep his type and screen up to     date.  We will give him 1 unit of blood tonight.  I suspect he will     need 3 units by the time he leaves, however, given his end-stage renal     disease and the fact that he makes little urine, I think it is     prudent  to give him 1 unit tonight slowly and another one tomorrow     if he tolerates this well.  There is no need to urgently transfuse     a more than that at the current time.  He has no evidence of end-     organ ischemia at this time.  We will put him on a Nexium drip followed by     infusion.  We will  keep him n.p.o. after     midnight.  In case, he bleeds more, he needs an urgent scope     tomorrow.  We will continue most of his home meds.  We will hold     his aspirin and Plavix for now given his acute bleeding.  This last     done was in 2008, thus it is okay to hold these for a few days.     Renal consult will need to be placed if he is here through Tuesday.  We will keep him on his     scheduled dialysis, Tuesday, Thursday, Saturday.  If he gets lot of     blood in the meantime, he may need an extra session although not     clear that he does not need any dialysis at the current time.     Obviously, it will be prudent to consider upper endoscopy, likely     on Monday, thus the GI Consult Team will need to be notified,     probably tomorrow unless he bleeds more tonight, in which case I     will contact them tonight.  Prophylaxis with SCDs.          ______________________________ Arlina Robes, MD     DP/MEDQ  D:  05/05/2011  T:  05/05/2011  Job:  [id number]  Electronically Signed by Arlina Robes MD on 05/06/2011 06:54:33 AM

## 2011-05-07 ENCOUNTER — Ambulatory Visit: Payer: Medicare HMO | Admitting: Internal Medicine

## 2011-05-07 ENCOUNTER — Inpatient Hospital Stay (HOSPITAL_COMMUNITY): Payer: Medicare HMO

## 2011-05-07 LAB — BASIC METABOLIC PANEL
CO2: 30 mEq/L (ref 19–32)
Calcium: 9.4 mg/dL (ref 8.4–10.5)
Chloride: 97 mEq/L (ref 96–112)
Glucose, Bld: 93 mg/dL (ref 70–99)
Sodium: 140 mEq/L (ref 135–145)

## 2011-05-07 LAB — GLUCOSE, CAPILLARY
Glucose-Capillary: 95 mg/dL (ref 70–99)
Glucose-Capillary: 133 mg/dL — ABNORMAL HIGH (ref 70–99)

## 2011-05-07 LAB — CROSSMATCH: Unit division: 0

## 2011-05-07 LAB — CBC
Hemoglobin: 8.1 g/dL — ABNORMAL LOW (ref 13.0–17.0)
MCH: 29.6 pg (ref 26.0–34.0)
MCV: 89.4 fL (ref 78.0–100.0)
RBC: 2.74 MIL/uL — ABNORMAL LOW (ref 4.22–5.81)

## 2011-05-08 ENCOUNTER — Inpatient Hospital Stay (HOSPITAL_COMMUNITY): Payer: Medicare HMO

## 2011-05-08 LAB — GLUCOSE, CAPILLARY
Glucose-Capillary: 154 mg/dL — ABNORMAL HIGH (ref 70–99)
Glucose-Capillary: 109 mg/dL — ABNORMAL HIGH (ref 70–99)
Glucose-Capillary: 120 mg/dL — ABNORMAL HIGH (ref 70–99)

## 2011-05-08 LAB — RENAL FUNCTION PANEL
Albumin: 3.1 g/dL — ABNORMAL LOW (ref 3.5–5.2)
GFR calc Af Amer: 5 mL/min — ABNORMAL LOW (ref >60)
GFR calc non Af Amer: 4 mL/min — ABNORMAL LOW (ref >60)
Phosphorus: 8.5 mg/dL — ABNORMAL HIGH (ref 2.3–4.6)
Potassium: 4.6 mEq/L (ref 3.5–5.1)
Sodium: 145 mEq/L (ref 135–145)

## 2011-05-08 LAB — CBC
Hemoglobin: 7.8 g/dL — ABNORMAL LOW (ref 13.0–17.0)
MCHC: 32.6 g/dL (ref 30.0–36.0)
RDW: 20.8 % — ABNORMAL HIGH (ref 11.5–15.5)
WBC: 7.7 10*3/uL (ref 4.0–10.5)

## 2011-05-08 LAB — FERRITIN: Ferritin: 845 ng/mL — ABNORMAL HIGH (ref 22–322)

## 2011-05-08 LAB — IRON AND TIBC: UIBC: 170 ug/dL

## 2011-05-09 ENCOUNTER — Ambulatory Visit: Payer: Medicare HMO | Admitting: Physician Assistant

## 2011-05-09 ENCOUNTER — Other Ambulatory Visit: Payer: Self-pay | Admitting: Gastroenterology

## 2011-05-09 LAB — GLUCOSE, CAPILLARY
Glucose-Capillary: 80 mg/dL (ref 70–99)
Glucose-Capillary: 108 mg/dL — ABNORMAL HIGH (ref 70–99)
Glucose-Capillary: 90 mg/dL (ref 70–99)

## 2011-05-10 ENCOUNTER — Inpatient Hospital Stay (HOSPITAL_COMMUNITY): Payer: Medicare HMO

## 2011-05-10 LAB — RENAL FUNCTION PANEL
Albumin: 3.4 g/dL — ABNORMAL LOW (ref 3.5–5.2)
BUN: 39 mg/dL — ABNORMAL HIGH (ref 6–23)
Phosphorus: 7 mg/dL — ABNORMAL HIGH (ref 2.3–4.6)
Potassium: 4.3 mEq/L (ref 3.5–5.1)

## 2011-05-10 LAB — CBC
HCT: 26.4 % — ABNORMAL LOW (ref 39.0–52.0)
MCHC: 32.6 g/dL (ref 30.0–36.0)
RDW: 20 % — ABNORMAL HIGH (ref 11.5–15.5)

## 2011-05-10 LAB — GLUCOSE, CAPILLARY: Glucose-Capillary: 92 mg/dL (ref 70–99)

## 2011-05-11 NOTE — Discharge Summary (Signed)
[name], [name] NO.:  [id number]  MEDICAL RECORD NO.:  [id number]  LOCATION:                                 FACILITY:  PHYSICIAN:  Lottie Dawson, MD       DATE OF BIRTH:  [birth year]  DATE OF ADMISSION:  05/05/2011 DATE OF DISCHARGE:  05/10/2011                              DISCHARGE SUMMARY   PRIMARY CARE PHYSICIAN:  Biagio Borg, MD  GASTROENTEROLOGIST:  Arta Silence, MD  DISCHARGE DIAGNOSES: 1. Gastrointestinal bleed/melena, source unclear, status post     esophagogastroduodenoscopy and colonoscopy. 2. Acute blood loss anemia from gastrointestinal bleed.  Status post 3     units of packed red blood cells. 3. End-stage renal disease, on hemodialysis from right forearm     arteriovenous graft, on dialysis on Tuesday, Thursday, and     Saturday. 4. Hypertension. 5. Type 2 diabetes, non-insulin dependent. 6. Morbid obesity. 7. Depression. 8. History of coronary artery disease, status post drug-eluting stent     placed to right coronary artery in 2008, no stents since then. 9. History of mildly reduced ejection fraction with diagnosis of     ischemic cardiomyopathy. 10.History of anemia, likely due to chronic disease and chronic kidney     disease. 11.History of secondary hyperthyroidism. 12.Obstructive sleep apnea, on CPAP. 13.History of gout. 14.History of peripheral neuropathy. 15.History of multinodular goiter. 16.Hyperlipidemia.  DISCHARGE MEDICATIONS: 1. Omeprazole 20 mg p.o. daily. 2. Allopurinol 100 mg p.o. daily. 3. Aspirin 81 p.o. daily. 4. Atorvastatin 10 mg every other day. 5. Carvedilol 12.5 mg on Monday, Wednesday, and Friday. 6. Gabapentin 300 mg p.o. daily at bedtime. 7. Methimazole 5 mg p.o. twice a week. 8. Calcium carbonate 1500 mg p.o. 3 times a day with meals. 9. Plavix 75 mg p.o. daily. 10.Ranitidine 300 mg p.o. daily at bedtime. 11.Renal vitamin 1 tablet p.o. daily. 12.Sensipar 60 mg p.o. twice  daily. 13.Colesevelam 3.75 one packet p.o. daily  BRIEF ADMITTING HISTORY AND PHYSICAL:  [name] is a 60 year old African American male with history of end-stage renal disease on hemodialysis who presented to the ER with complaints of fatigue on May 05, 2011, and was found to have low hemoglobin.  RADIOLOGY/IMAGING:  The patient had x-ray of the T-spine and L-spine which showed moderate thoracic spondylosis.  At L4-L5 posterior lumbar interbody fusion.  L3-L4 Ray Cage fusion grafts.  Moderate to severe lumbar spondylosis at L1-L2 and L2-L3.  LABORATORY DATA:  CBC shows a white count of 7.3, hemoglobin 8.6, hematocrit 26.4, and platelet count 273.  Initial hemoglobin on presentation was 5.8.  Electrolytes normal with a BUN of 39, creatinine 11.08.  Fecal occult positive.  PROCEDURES:  The patient had an EGD done by Dr. Paulita Fujita on May 07, 2011, which showed pyloric stenosis, mild to moderate without associated stricture/ulcer/mass.  Solitary duodenal AVM, not source of anemia/melanoma.  The patient had colonoscopy on May 09, 2011, which showed pancolonic diverticulosis.  Small rectal polyps removed. Otherwise normal.  No source of melena with anemia was identified.  PROCEDURE PENDING:  Capsule endoscopy.  HOSPITAL COURSE BY PROBLEM: 1. GI bleed/melena.  The patient had both EGD and colonoscopy  with     results as indicated above.  No source of bleeding was found.  The     patient was transfused 3 units of blood.  Subsequently, after blood     transfusion, the patient's hemoglobin was stable.  Dr. Paulita Fujita from     Gastroenterology did the above procedures.  Capsule endoscopy was     performed and the test results were still pending at this time.     Spoke with Dr. Penelope Coop covering for Dr. Paulita Fujita, indicated that given     the hemoglobin was stable the patient could be discharged and Dr.     Paulita Fujita or Dr. Penelope Coop will call the patient with the results. 2. Acute blood loss anemia,  status post 3 units of PRBC, hemoglobin     stable during the course hospital stay. 3. End-stage renal disease.  The patient was continued on hemodialysis     as per Renal. 4. Hypertension, stable.  Continue the patient on home medications. 5. Type 2 diabetes, stable.  During the course hospital stay, the     patient was placed on sliding scale insulin. 6. Morbid obesity.  Further management as an outpatient. 7. Obstructive sleep apnea.  The patient was on CPAP during the course     of hospital stay. 8. History of coronary artery disease.  Initially, the patient's     aspirin and Plavix were held due to GI bleed.  Given the patient     has had a drug-eluting stent, the patient will resume aspirin and     Plavix at the time of discharge. This was discussed with Dr. Penelope Coop     prior to discharge.  DISPOSITION AND FOLLOWUP:  The patient is to follow up with Dr. [name], primary care physician in 1 week.  The patient was also instructed to call Dr. Paulita Fujita if any bleeding occurs.  If the bleeding is significant, the patient was instructed to come back to the ER.  Time spent on discharge talking to the patient and family and coordinating care was 35 minutes.   Lottie Dawson, MD   SR/MEDQ  D:  05/10/2011  T:  05/11/2011  Job:  [id number]  Electronically Signed by Alveta Heimlich Ryken Paschal  on 05/11/2011 08:04:46 PM

## 2011-05-11 NOTE — Consult Note (Signed)
NAMEMAVEN, CHALMERS NO.:  [id number]  MEDICAL RECORD NO.:  [id number]  LOCATION:  [id number]                         FACILITY:  [facility]  PHYSICIAN:  Arta Silence, MD     DATE OF BIRTH:  11-20-1950  DATE OF CONSULTATION:  05/06/2011 DATE OF DISCHARGE:                                CONSULTATION   REASON FOR CONSULTATION:  Anemia, blood in stool, and weakness.  CHIEF COMPLAINT:  Weakness.  HISTORY OF PRESENT ILLNESS:  [name] is a 60 year old black gentleman with history of end-stage renal disease on dialysis every Tuesday, Thursday, and Saturday.  He tells me that for the past 2-3 weeks he has had progressive fatigue, dyspnea on exertion, and lethargy.  He has also noted over this timeframe black having black stools.  He has no abdominal pain, nausea, vomiting, dysphagia, odynophagia, or hematochezia.  He uses NSAIDs occasionally and is on Plavix chronically for his history of coronary artery disease.  The patient has a history of bile duct stones requiring several ERCPs, last of these was about a year ago.  He has also had endoscopy with dilatation several years ago as well as a colonoscopy in 2005 which showed a small rectal polyp, but was otherwise normal now.  PAST MEDICAL HISTORY, PAST SURGICAL HISTORY, HOME MEDICATIONS, ALLERGIES, FAMILY HISTORY, SOCIAL HISTORY, REVIEW OF SYSTEMS:  From dictated note from Dr. Tish Men dated May 05, 2011.  I have reviewed and I agree.  PHYSICAL EXAMINATION:  VITAL SIGNS:  Blood pressure 117/59, heart rate 84, respiratory rate 19, temperature 98.8, and oxygen saturation 93% on room air. GENERAL:  [name] is nontoxic appearing.  He is older appearing than stated age. ENT:  Fair dentition.  No oropharyngeal lesions. EYES:  Sclerae are anicteric.  Conjunctivae are pale. NECK:  Thick, but supple. LUNGS:  Clear. HEART:  Regular.  Soft 2/6 systolic ejection murmur at the left upper sternal border. ABDOMEN:  Soft,  protuberant.  No liver or splenic enlargement.  No bulging flanks to suggest ascites.  Normoactive bowel sounds. EXTREMITIES:  No peripheral cyanosis, clubbing, or edema.  He has got right-sided AV fistula with palpable thrill. NEUROLOGIC:  Diffusely weak, but nonfocal without lateralizing signs. PSYCHIATRIC:  Normal mood and affect. SKIN:  No rash or ecchymosis. LYMPHATICS:  No palpable axillary, submandibular, or supraclavicular adenopathy.  LABORATORY STUDIES:  Hemoglobin on arrival 5.8, currently 6.1; white count 8.5; platelet count 244; MCV is 94.  INR is 0.9.  Sodium 141, potassium 4.2, chloride 99, bicarb 32, BUN 29, and creatinine 7.5. Liver tests are normal with the exception of slightly low albumin at 3.3.  He is Hemoccult positive.  IMPRESSION:  [name] is a 60 year old gentleman with progressive anemia with Hemoccult positive stools as well as some melena.  History is most consistent with an upper gastrointestinal bleeding source, which is seemingly very slow, but progressive.  I would suspect an ulcer or arteriovenous malformations as the etiology, given his use of aspirin and clopidogrel therapy.  He is hemodynamically stable.  PLAN: 1. Agree with supportive management with IV fluids and transfusions. 2. Would maintain PPI therapy. 3. As long as his hemoglobin is around  7 or above, we will plan on     upper endoscopy tomorrow with possible hemostatic therapy. 4. If the endoscopy is negative, we will need to consider colonoscopy.  Thanks again for allowing me to participate in [name] care.     Arta Silence, MD     WO/MEDQ  D:  05/06/2011  T:  05/06/2011  Job:  [id number]  Electronically Signed by Arta Silence  on 05/11/2011 07:24:36 PM

## 2011-05-21 ENCOUNTER — Ambulatory Visit: Payer: Medicare HMO | Admitting: Endocrinology

## 2011-05-22 NOTE — Op Note (Signed)
  NAMESHEAMUS, WAGGENER                 ACCOUNT NO.:  [id number]  MEDICAL RECORD NO.:  [id number]  LOCATION:  6703                         FACILITY:  [facility]  PHYSICIAN:  [name], MDDATE OF BIRTH:  January 09, 1951  DATE OF PROCEDURE: DATE OF DISCHARGE:  05/10/2011                              OPERATIVE REPORT   INDICATIONS: 1. GI bleed. 2. Anemia.  FINDINGS:  Small bowel passage time 3 hours 21 minutes.  Small nonbleeding red spot noted in small bowel.  Normal capsule endoscopy.  No source of anemia, GI bleed seen.  SUMMARY/RECOMMENDATIONS: 1. Normal capsule endoscopy. 2. Suspect AVM (arteriovenous malformations) not seen on capsule     endoscopy as a source of GI bleed (no blood seen on capsule). 3. Iron supplementation.     [name], MD     VCS/MEDQ  D:  05/12/2011  T:  05/12/2011  Job:  [id number]  cc:   Arta Silence, MD  Electronically Signed by Wilford Corner MD on 05/22/2011 04:50:57 PM

## 2011-05-25 ENCOUNTER — Ambulatory Visit: Payer: Medicare HMO | Admitting: Endocrinology

## 2011-06-01 ENCOUNTER — Ambulatory Visit: Payer: Medicare HMO | Admitting: Vascular Surgery

## 2011-06-03 IMAGING — CR DG ABDOMEN ACUTE W/ 1V CHEST
3 series · 3 of 3 positions shown · non-contrast
Comparison: Previous examinations.

CLINICAL DATA: Nausea, vomiting and diarrhea.

ACUTE ABDOMEN SERIES (ABDOMEN 2 VIEW & CHEST 1 VIEW)

[w chest pa]
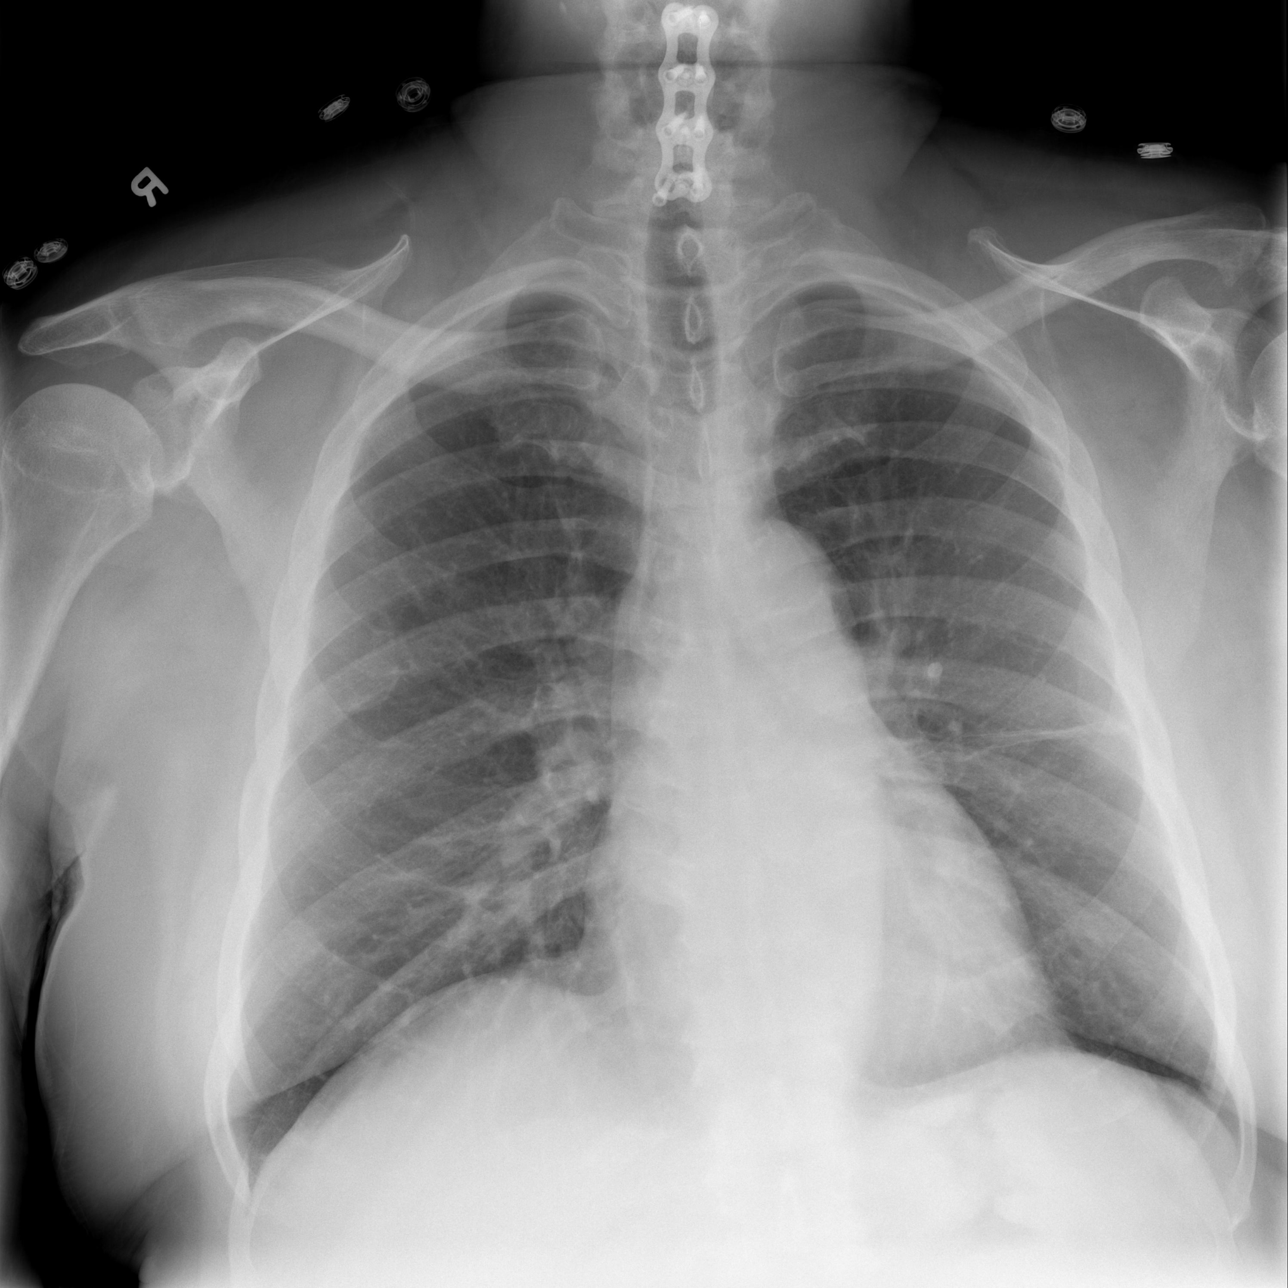

[w abdomen upright]
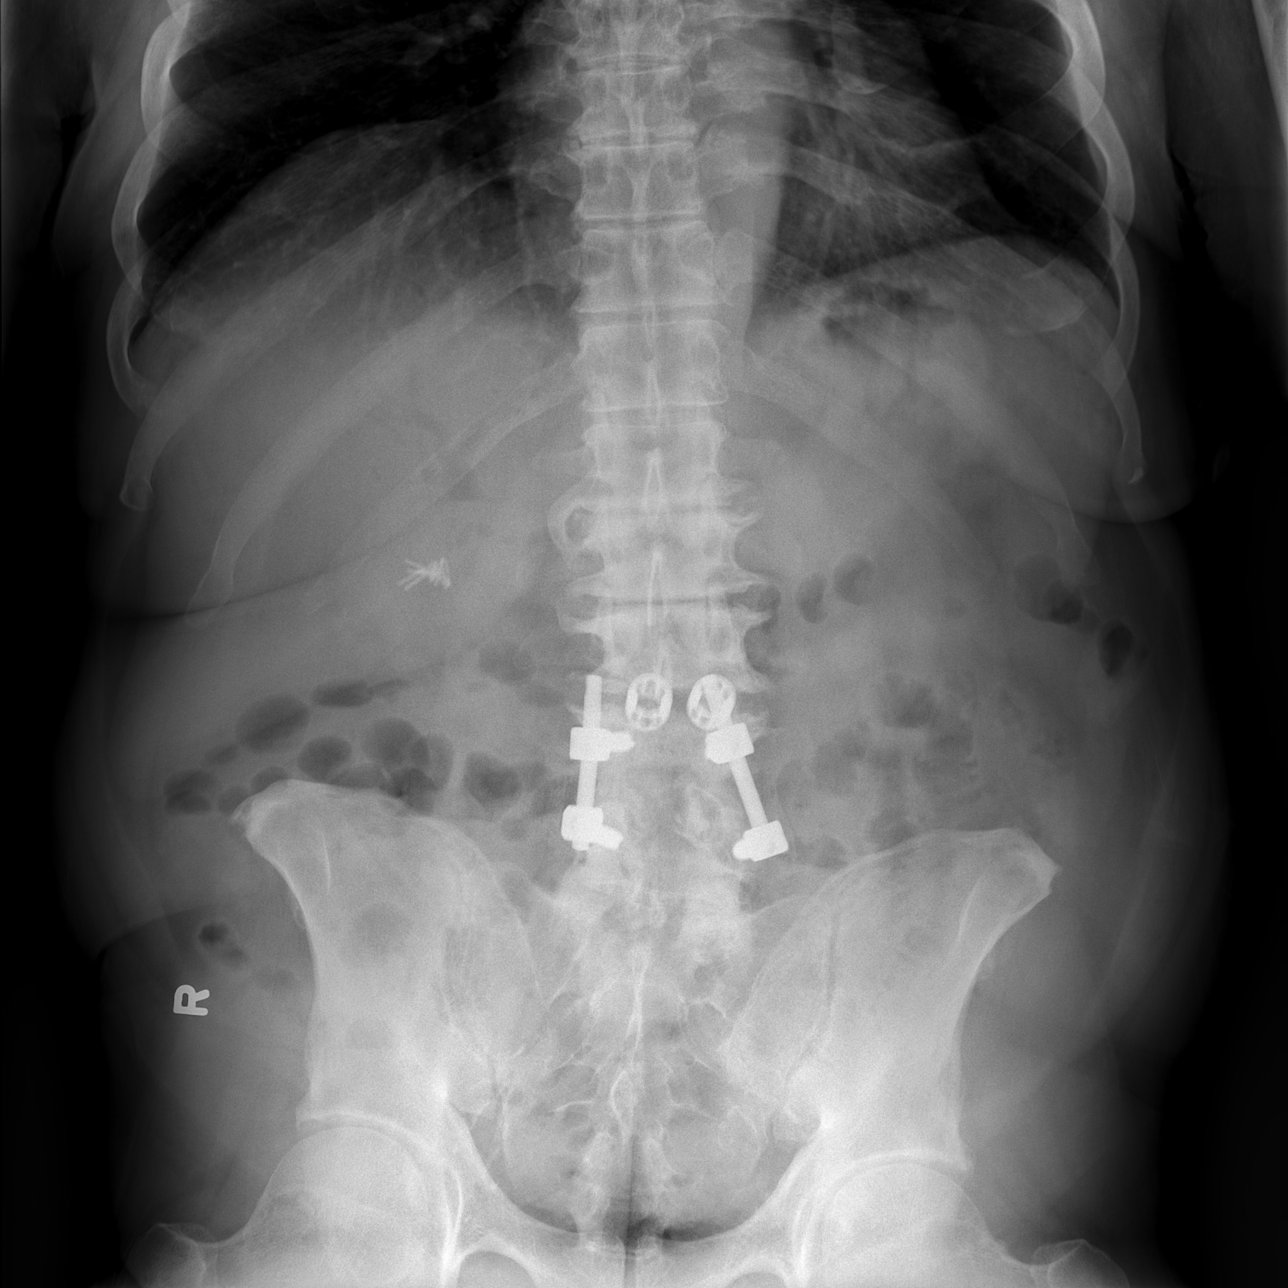

[t abdomen supine]
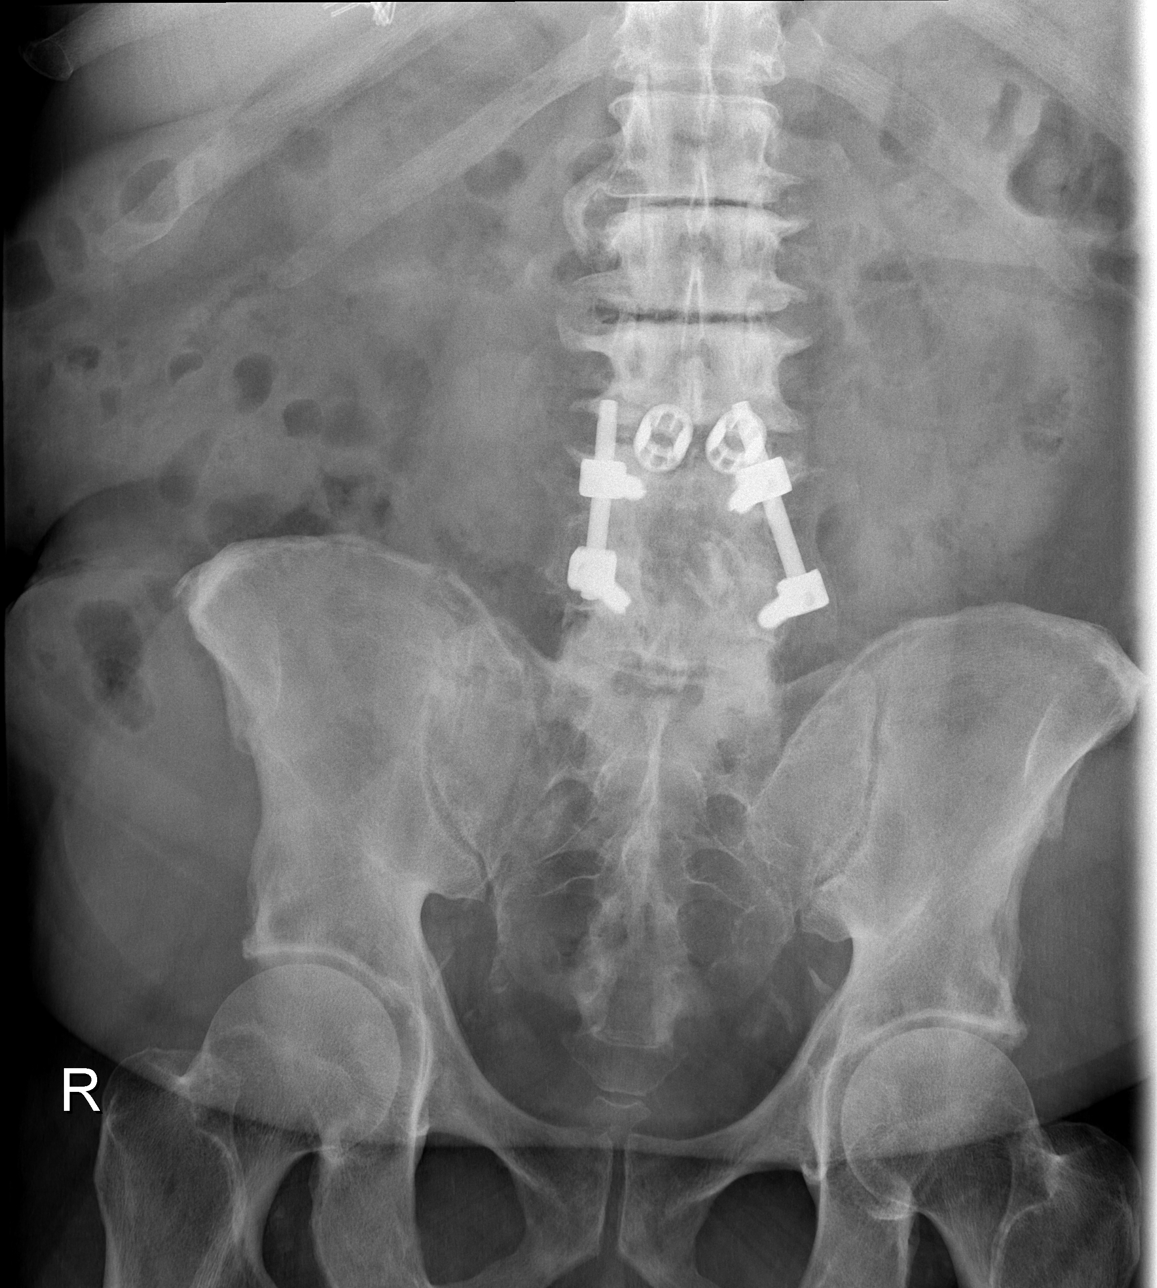

[3 of 3 positions shown; findings below may reference images not displayed]

FINDINGS: Heart size near the upper limit of normal with a decrease
in the size since 03/29/2010.  Stable linear scar in the left mid
lung zone.  Normal bowel gas pattern without free peritoneal air.
Lumbar and cervical spine fixation hardware.  Lower lumbar spine
laminectomy defects.  Lumbar, thoracic and cervical spine
degenerative changes.  Cholecystectomy clips.
IMPRESSION: No acute abnormality.

## 2011-06-14 ENCOUNTER — Encounter: Payer: Self-pay | Admitting: Vascular Surgery

## 2011-06-15 ENCOUNTER — Ambulatory Visit (INDEPENDENT_AMBULATORY_CARE_PROVIDER_SITE_OTHER): Payer: Medicare HMO | Admitting: *Deleted

## 2011-06-15 ENCOUNTER — Ambulatory Visit (INDEPENDENT_AMBULATORY_CARE_PROVIDER_SITE_OTHER): Payer: Medicare HMO | Admitting: Internal Medicine

## 2011-06-15 ENCOUNTER — Ambulatory Visit: Payer: Medicare HMO

## 2011-06-15 ENCOUNTER — Telehealth: Payer: Self-pay | Admitting: *Deleted

## 2011-06-15 ENCOUNTER — Encounter: Payer: Self-pay | Admitting: Internal Medicine

## 2011-06-15 ENCOUNTER — Encounter: Payer: Self-pay | Admitting: Vascular Surgery

## 2011-06-15 ENCOUNTER — Ambulatory Visit (INDEPENDENT_AMBULATORY_CARE_PROVIDER_SITE_OTHER): Payer: Medicare HMO | Admitting: Endocrinology

## 2011-06-15 ENCOUNTER — Other Ambulatory Visit: Payer: Self-pay | Admitting: Vascular Surgery

## 2011-06-15 ENCOUNTER — Ambulatory Visit (INDEPENDENT_AMBULATORY_CARE_PROVIDER_SITE_OTHER): Payer: Medicare HMO | Admitting: Vascular Surgery

## 2011-06-15 ENCOUNTER — Encounter: Payer: Self-pay | Admitting: Endocrinology

## 2011-06-15 ENCOUNTER — Other Ambulatory Visit: Payer: Self-pay | Admitting: *Deleted

## 2011-06-15 VITALS — BP 110/60 | HR 80 | Temp 97.6°F | Ht 72.0 in | Wt 249.8 lb

## 2011-06-15 VITALS — BP 110/60 | HR 80 | Temp 97.6°F | Ht 72.0 in | Wt 249.5 lb

## 2011-06-15 VITALS — BP 132/79 | HR 70 | Resp 18 | Ht 73.0 in | Wt 245.0 lb

## 2011-06-15 DIAGNOSIS — I255 Ischemic cardiomyopathy: Secondary | ICD-10-CM

## 2011-06-15 DIAGNOSIS — Z0000 Encounter for general adult medical examination without abnormal findings: Secondary | ICD-10-CM

## 2011-06-15 DIAGNOSIS — Z48812 Encounter for surgical aftercare following surgery on the circulatory system: Secondary | ICD-10-CM

## 2011-06-15 DIAGNOSIS — E119 Type 2 diabetes mellitus without complications: Secondary | ICD-10-CM

## 2011-06-15 DIAGNOSIS — Z0389 Encounter for observation for other suspected diseases and conditions ruled out: Secondary | ICD-10-CM

## 2011-06-15 DIAGNOSIS — N184 Chronic kidney disease, stage 4 (severe): Secondary | ICD-10-CM

## 2011-06-15 DIAGNOSIS — N186 End stage renal disease: Secondary | ICD-10-CM

## 2011-06-15 DIAGNOSIS — N2581 Secondary hyperparathyroidism of renal origin: Secondary | ICD-10-CM

## 2011-06-15 DIAGNOSIS — I959 Hypotension, unspecified: Secondary | ICD-10-CM

## 2011-06-15 DIAGNOSIS — I2589 Other forms of chronic ischemic heart disease: Secondary | ICD-10-CM

## 2011-06-15 DIAGNOSIS — D649 Anemia, unspecified: Secondary | ICD-10-CM

## 2011-06-15 DIAGNOSIS — M25561 Pain in right knee: Secondary | ICD-10-CM

## 2011-06-15 LAB — CBC WITH DIFFERENTIAL/PLATELET
Basophils Relative: 1 % (ref 0.0–3.0)
Eosinophils Relative: 2.9 % (ref 0.0–5.0)
HCT: 33.7 % — ABNORMAL LOW (ref 39.0–52.0)
MCV: 96 fl (ref 78.0–100.0)
Monocytes Absolute: 0.9 10*3/uL (ref 0.1–1.0)
Neutrophils Relative %: 53.4 % (ref 43.0–77.0)
RBC: 3.51 Mil/uL — ABNORMAL LOW (ref 4.22–5.81)
WBC: 6.4 10*3/uL (ref 4.5–10.5)

## 2011-06-15 LAB — PSA: PSA: 2.2 ng/mL (ref 0.10–4.00)

## 2011-06-15 LAB — URINALYSIS, ROUTINE W REFLEX MICROSCOPIC
Nitrite: NEGATIVE
Urobilinogen, UA: 0.2 (ref 0.0–1.0)

## 2011-06-15 LAB — HEPATIC FUNCTION PANEL
ALT: 17 U/L (ref 0–53)
Total Bilirubin: 0.8 mg/dL (ref 0.3–1.2)

## 2011-06-15 LAB — LIPID PANEL
Cholesterol: 274 mg/dL — ABNORMAL HIGH (ref 0–200)
HDL: 46.1 mg/dL (ref >39.00)
Triglycerides: 275 mg/dL — ABNORMAL HIGH (ref 0.0–149.0)
VLDL: 55 mg/dL — ABNORMAL HIGH (ref 0.0–40.0)

## 2011-06-15 NOTE — Telephone Encounter (Signed)
Ok to refer to ortho - done per emr

## 2011-06-15 NOTE — Progress Notes (Signed)
Subjective:    Patient ID: [name], male    DOB: 1950/12/06, 60 y.o.   MRN: [id number]  HPI  Here for wellness and f/u;  Overall doing ok;  Pt denies CP, worsening SOB, DOE, wheezing, orthopnea, PND, worsening LE edema, palpitations, dizziness or syncope.  Pt denies neurological change such as new Headache, facial or extremity weakness.  Pt denies polydipsia, polyuria, or low sugar symptoms. Pt states overall good compliance with treatment and medications, good tolerability, and trying to follow lower cholesterol diet.  Pt denies worsening depressive symptoms, suicidal ideation or panic. No fever, wt loss, night sweats, loss of appetite, or other constitutional symptoms.  Pt states good ability with ADL's, low fall risk, home safety reviewed and adequate, no significant changes in hearing or vision, and occasionally active with exercise.  Did have recent severe anemia, s/p transfusion, and endoscopy/colonoscopy per [facility]- Dr Paulita Fujita.  Still with episodes of low BP at midday.  Fistula may be ? Clotted - has appt earlier today - has appt Mon sept 24 for angiogram. Past Medical History  Diagnosis Date  . Hyperthyroidism   . GASTROENTERITIS, VIRAL 10/14/2009  . ONYCHOMYCOSIS, TOENAILS 12/26/2007  . GOITER, MULTINODULAR 12/26/2007  . HYPERTHYROIDISM 02/02/2010  . DIABETES MELLITUS, TYPE II 02/01/2010  . DYSLIPIDEMIA 06/18/2007  . GOUT 06/18/2007  . Hypocalcemia 06/07/2010  . DEMENTIA 09/02/2009  . DEPRESSION 10/14/2009  . PERIPHERAL NEUROPATHY 06/18/2007  . HYPERTENSION 06/18/2007  . CAD, NATIVE VESSEL 02/06/2009  . CONGESTIVE HEART FAILURE 06/18/2007  . DIASTOLIC HEART FAILURE, CHRONIC 02/06/2009  . Unspecified hypotension 01/30/2010  . PULMONARY NODULE, RIGHT LOWER LOBE 06/08/2009  . GERD 06/18/2007  . ESRD 08/04/2010  . BENIGN PROSTATIC HYPERTROPHY 10/14/2009  . GYNECOMASTIA 07/17/2010  . NECK PAIN 07/31/2010  . FOOT PAIN 08/12/2008  . DIZZINESS 07/17/2010  . Other malaise and fatigue 11/24/2009  . GAIT  DISTURBANCE 03/03/2010  . DYSPNEA 10/29/2008  . CHEST PAIN 03/29/2010  . TRANSAMINASES, SERUM, ELEVATED 02/01/2010  . COLONIC POLYPS, HX OF 10/14/2009   Past Surgical History  Procedure Date  . Stent 06/11/08   . Neck sugury 2/09  . Back surgury 1998    reports that he has quit smoking. He does not have any smokeless tobacco history on file. He reports that he does not use illicit drugs. His alcohol history not on file. family history includes Heart disease in his father and sister. Allergies  Allergen Reactions  . Cephalexin   . Statins     REACTION: perceived myalgias   Current Outpatient Prescriptions on File Prior to Visit  Medication Sig Dispense Refill  . allopurinol (ZYLOPRIM) 100 MG tablet Take 100 mg by mouth daily.        [name] aspirin 81 MG EC tablet Take 81 mg by mouth daily.        [name] atorvastatin (LIPITOR) 10 MG tablet 1 by mouth every other day       . calcium-vitamin D (OSCAL WITH D) 500-200 MG-UNIT per tablet 2 by mouth three times per week       . carvedilol (COREG) 12.5 MG tablet Take 12.5 mg by mouth 2 (two) times daily.        . cinacalcet (SENSIPAR) 60 MG tablet Take 60 mg by mouth 2 (two) times daily.        . clopidogrel (PLAVIX) 75 MG tablet Take 75 mg by mouth daily.        . Colesevelam HCl (WELCHOL) 3.75 G PACK 1 pack by  mouth in water once daily       . cyclobenzaprine (FLEXERIL) 10 MG tablet 1 tab x a day as needed for pain  90 tablet  11  . epoetin alfa (EPOGEN) 3000 UNIT/ML injection Inject 3,000 Units into the skin 3 (three) times a week. At dialysis       . ezetimibe (ZETIA) 10 MG tablet Take 10 mg by mouth daily.        [name] gabapentin (NEURONTIN) 300 MG capsule Take 300 mg by mouth at bedtime as needed.        . methimazole (TAPAZOLE) 5 MG tablet 1 tablet 2 times a week       . multivitamin (RENA-VIT) TABS tablet Take 1 tablet by mouth daily.        . Omega-3 Fatty Acids (FISH OIL) 1000 MG CAPS Take by mouth 3 (three) times daily.        . ranitidine (ZANTAC)  300 MG capsule Take 300 mg by mouth daily.        [name] tiZANidine (ZANAFLEX) 4 MG tablet Take 1 tablet (4 mg total) by mouth 3 (three) times daily.  90 tablet  1   Review of Systems Review of Systems  Constitutional: Negative for diaphoresis, activity change, appetite change and unexpected weight change.  HENT: Negative for hearing loss, ear pain, facial swelling, mouth sores and neck stiffness.   Eyes: Negative for pain, redness and visual disturbance.  Respiratory: Negative for shortness of breath and wheezing.   Cardiovascular: Negative for chest pain and palpitations.  Gastrointestinal: Negative for diarrhea, blood in stool, abdominal distention and rectal pain.  Genitourinary: Negative for hematuria, flank pain and decreased urine volume.  Musculoskeletal: Negative for myalgias and joint swelling.  Skin: Negative for color change and wound.  Neurological: Negative for syncope and numbness.  Hematological: Negative for adenopathy.  Psychiatric/Behavioral: Negative for hallucinations, self-injury, decreased concentration and agitation.      Objective:   Physical Exam BP 110/60  Pulse 80  Temp(Src) 97.6 F (36.4 C) (Oral)  Ht 6' (1.829 m)  Wt 249 lb 8 oz (113.172 kg)  BMI 33.84 kg/m2  SpO2 94% Physical Exam  VS noted Constitutional: Pt is oriented to person, place, and time. Appears well-developed and well-nourished.  HENT:  Head: Normocephalic and atraumatic.  Right Ear: External ear normal.  Left Ear: External ear normal.  Nose: Nose normal.  Mouth/Throat: Oropharynx is clear and moist.  Eyes: Conjunctivae and EOM are normal. Pupils are equal, round, and reactive to light.  Neck: Normal range of motion. Neck supple. No JVD present. No tracheal deviation present.  Cardiovascular: Normal rate, regular rhythm, normal heart sounds and intact distal pulses.   Pulmonary/Chest: Effort normal and breath sounds normal.  Abdominal: Soft. Bowel sounds are normal. There is no  tenderness.  Musculoskeletal: Normal range of motion. Exhibits no edema.  Lymphadenopathy:  Has no cervical adenopathy.  Neurological: Pt is alert and oriented to person, place, and time. Pt has normal reflexes. No cranial nerve deficit.  Skin: Skin is warm and dry. No rash noted.  Psychiatric:  Has  Dysphoric  mood and affect. Behavior is somewhat irritable    Assessment & Plan:

## 2011-06-15 NOTE — Assessment & Plan Note (Signed)

## 2011-06-15 NOTE — Patient Instructions (Addendum)
blood tests are being ordered for you today.  please call [phone] to hear your test results.  You will be prompted to enter the 9-digit "MRN" number that appears at the top left of this page, followed by #.  Then you will hear the message. if ever you have fever while taking methimazole, stop it and call us, because of the risk of a rare side-effect Please make a follow-up appointment in 3 months.    (update: i left message on phone-tree:  No med is needed now.

## 2011-06-15 NOTE — Telephone Encounter (Signed)
Pt is having problems with arthritis in his knees and is asking for MD's advisement

## 2011-06-15 NOTE — Patient Instructions (Signed)
Continue all other medications as before Please document any further episodes of low blood pressure in the future by writing down:  BP, time and date, when you took the carvedilol that day, and when you had dialysis if done that day Please go to LAB in the Basement for the blood and/or urine tests to be done today Please call the phone number [phone] (the [facility]) for results of testing in 2-3 days;  When calling, simply dial the number, and when prompted enter the MRN number above (the Medical Record Number) and the # key, then the message should start. Please see Dr Loanne Drilling today (please checkin at the front desk after you go to the lab) Please keep your appointments with your specialists as you have planned - Dr Jeffie Pollock Please return in 6 months, or sooner if needed

## 2011-06-15 NOTE — Progress Notes (Signed)
Subjective:    Patient ID: [name], male    DOB: Jul 06, 1951, 60 y.o.   MRN: [id number]  HPI The state of at least three ongoing medical problems is addressed today: Hyperthyroidism: he takes tapazole as rx'ed.  He has fatigue in the middle of the day.   Dm: no cbg record, but states cbg's are well-controlled.  No weight change. Severe anemia: He recently had blood transfusion and endoscopic w/u. Past Medical History  Diagnosis Date  . Hyperthyroidism   . GASTROENTERITIS, VIRAL 10/14/2009  . ONYCHOMYCOSIS, TOENAILS 12/26/2007  . GOITER, MULTINODULAR 12/26/2007  . HYPERTHYROIDISM 02/02/2010  . DIABETES MELLITUS, TYPE II 02/01/2010  . DYSLIPIDEMIA 06/18/2007  . GOUT 06/18/2007  . Hypocalcemia 06/07/2010  . DEMENTIA 09/02/2009  . DEPRESSION 10/14/2009  . PERIPHERAL NEUROPATHY 06/18/2007  . HYPERTENSION 06/18/2007  . CAD, NATIVE VESSEL 02/06/2009  . CONGESTIVE HEART FAILURE 06/18/2007  . DIASTOLIC HEART FAILURE, CHRONIC 02/06/2009  . Unspecified hypotension 01/30/2010  . PULMONARY NODULE, RIGHT LOWER LOBE 06/08/2009  . GERD 06/18/2007  . ESRD 08/04/2010  . BENIGN PROSTATIC HYPERTROPHY 10/14/2009  . GYNECOMASTIA 07/17/2010  . NECK PAIN 07/31/2010  . FOOT PAIN 08/12/2008  . DIZZINESS 07/17/2010  . Other malaise and fatigue 11/24/2009  . GAIT DISTURBANCE 03/03/2010  . DYSPNEA 10/29/2008  . CHEST PAIN 03/29/2010  . TRANSAMINASES, SERUM, ELEVATED 02/01/2010  . COLONIC POLYPS, HX OF 10/14/2009    Past Surgical History  Procedure Date  . Stent 06/11/08   . Neck sugury 2/09  . Back surgury 1998    History   Social History  . Marital Status: Married    Spouse Name: N/A    Number of Children: N/A  . Years of Education: N/A   Occupational History  . disabled   . formerly Lawyer for [employer].    Social History Main Topics  . Smoking status: Former Research scientist (life sciences)  . Smokeless tobacco: Not on file   Comment: Quit smoking 2007 Smoked x 25 years 1/2 ppd.  . Alcohol Use: Not on file  . Drug Use:  No  . Sexually Active: Not on file   Other Topics Concern  . Not on file   Social History Narrative  . No narrative on file    Current Outpatient Prescriptions on File Prior to Visit  Medication Sig Dispense Refill  . allopurinol (ZYLOPRIM) 100 MG tablet Take 100 mg by mouth daily.        [name] amitriptyline (ELAVIL) 50 MG tablet       . aspirin 81 MG EC tablet Take 81 mg by mouth daily.        [name] atorvastatin (LIPITOR) 10 MG tablet 1 by mouth every other day       . calcium-vitamin D (OSCAL WITH D) 500-200 MG-UNIT per tablet 2 by mouth three times per week       . carvedilol (COREG) 12.5 MG tablet Take 12.5 mg by mouth 2 (two) times daily.        . cinacalcet (SENSIPAR) 60 MG tablet Take 60 mg by mouth 2 (two) times daily.        . citalopram (CELEXA) 10 MG tablet       . clopidogrel (PLAVIX) 75 MG tablet Take 75 mg by mouth daily.        . Colesevelam HCl (WELCHOL) 3.75 G PACK 1 pack by mouth in water once daily       . cyclobenzaprine (FLEXERIL) 10 MG tablet 1 tab x a day as needed  for pain  90 tablet  11  . epoetin alfa (EPOGEN) 3000 UNIT/ML injection Inject 3,000 Units into the skin 3 (three) times a week. At dialysis       . ezetimibe (ZETIA) 10 MG tablet Take 10 mg by mouth daily.        [name] gabapentin (NEURONTIN) 300 MG capsule Take 300 mg by mouth at bedtime as needed.        . methimazole (TAPAZOLE) 5 MG tablet 1 tablet 2 times a week       . multivitamin (RENA-VIT) TABS tablet Take 1 tablet by mouth daily.        . Omega-3 Fatty Acids (FISH OIL) 1000 MG CAPS Take by mouth 3 (three) times daily.        . ranitidine (ZANTAC) 300 MG capsule Take 300 mg by mouth daily.        [name] tiZANidine (ZANAFLEX) 4 MG tablet Take 1 tablet (4 mg total) by mouth 3 (three) times daily.  90 tablet  1    Allergies  Allergen Reactions  . Cephalexin   . Statins     REACTION: perceived myalgias    Family History  Problem Relation Age of Onset  . Heart disease Father   . Heart disease Sister     BP 110/60  Pulse 80  Temp(Src) 97.6 F (36.4 C) (Oral)  Ht 6' (1.829 m)  Wt 249 lb 12.8 oz (113.309 kg)  BMI 33.88 kg/m2  SpO2 94%  Review of Systems Denies fever.  He has diffuse itching.     Objective:   Physical Exam VITAL SIGNS:  See vs page GENERAL: no distress Neck: There is a goiter with multinodular surface Pulses: dorsalis pedis intact bilat.   Feet: no deformity.  no ulcer on the feet.  feet are of normal color and temp.  no edema Neuro: sensation is intact to touch on the feet, but decreased from normal   Fructosamine= 285 (converts to a1c of 6.5)     Assessment & Plan:  Dm.  No med is needed now Severe anemia.  Transfusion makes a1c uninterpretable Hyperthyroidism, on tapazole.

## 2011-06-15 NOTE — Progress Notes (Signed)
[facility]  Established Dialysis Access  History of Present Illness  [name] is a 60 y.o. male who presents for re-evaluation of his R FA AVG.  The patient's complication from previous access procedures include: thrombosis of L BVT due to trauma caused by cannulation.  He denies any problems with his dialysis since last AVG intervention.  He denies any fever or chills or all signs of infection of the R FA AVG.  Past Medical History, Past Surgical History, Social History, Family History, Medications, Allergies, and Review of Systems are unchanged from previous visit 02/23/11.  On review of systems, patient notes recently he was admitted to the hospital for acute anemia, etiology unknown.  Physical Examination  Filed Vitals:   06/15/11 1158  BP: 132/79  Pulse: 70  Resp: 18   General: A&O x 3, WDWN  Vascular: Bilateral radial and brachial pulses palpable  Musculoskeletal: M/S 5/5 throughout , Extremities without  ischemic changes, R FA AVG has a slight pulsatile character, bruit is a bit faint  Neurologic: Pain and light touch intact in extremities , Motor exam as listed above  Non-Invasive Vascular Imaging  R Arm Access Duplex  (Date: 06/15/11):   PSV up to 535 c/s in arterial arm  PSV up to 240 c/s in venous arm  Medical Decision Making  [name] is a 60 y.o. male who presents with ESRD requiring hemodialysis.   This patient's R arm access is quite tenuous so we have agreed to more aggressive surveillance of this FA AVG  Based on R arm access duplex , I recommend R arm shuntogram, possible intervention. I discussed with the patient the nature of angiographic procedures, especially the limited patencies of any endovascular intervention. The patient is aware of that the risks of an angiographic procedure include but are not limited to: bleeding, infection, access site complications, embolization, rupture of treated vessel, dissection,  possible need for emergent surgical intervention, and possible need for surgical procedures to treat the patient's pathology. The patient is aware of the risks and agrees to proceed.  The patient has agreed to proceed with the above procedure which will be scheduled 24 SEP 12.  Adele Barthel, MD [facility] Office: [phone] Pager: [phone]

## 2011-06-15 NOTE — Assessment & Plan Note (Signed)
Unclear etiology - I asked pt to track data in the future for any further low BP episodes ;  To take BP aqt home with episodes with date and time, and medication use that day (coreg) and any dialysis that day

## 2011-06-16 ENCOUNTER — Encounter: Payer: Self-pay | Admitting: Internal Medicine

## 2011-06-16 DIAGNOSIS — N2581 Secondary hyperparathyroidism of renal origin: Secondary | ICD-10-CM | POA: Insufficient documentation

## 2011-06-16 DIAGNOSIS — D649 Anemia, unspecified: Secondary | ICD-10-CM

## 2011-06-16 DIAGNOSIS — I255 Ischemic cardiomyopathy: Secondary | ICD-10-CM

## 2011-06-16 HISTORY — DX: Secondary hyperparathyroidism of renal origin: N25.81

## 2011-06-16 HISTORY — DX: Anemia, unspecified: D64.9

## 2011-06-16 HISTORY — DX: Ischemic cardiomyopathy: I25.5

## 2011-06-16 LAB — FRUCTOSAMINE: Fructosamine: 285 umol/L — ABNORMAL HIGH (ref <285)

## 2011-06-16 NOTE — Assessment & Plan Note (Signed)
stable overall by hx and exam, most recent data reviewed with pt, and pt to continue medical treatment as before  Lab Results  Component Value Date   HGBA1C 6.2 02/12/2011

## 2011-06-18 LAB — BASIC METABOLIC PANEL
BUN: 43 mg/dL — ABNORMAL HIGH (ref 6–23)
Creatinine, Ser: 9.4 mg/dL (ref 0.4–1.5)
GFR: 7.37 mL/min — CL (ref >60.00)

## 2011-06-18 NOTE — Telephone Encounter (Signed)
Pt informed of referral via VM. Left message for pt to callback office with any questions/concerns.

## 2011-06-20 ENCOUNTER — Telehealth: Payer: Self-pay

## 2011-06-20 MED ORDER — TRAMADOL-ACETAMINOPHEN 37.5-325 MG PO TABS: 1.0000 | ORAL_TABLET | Freq: Four times a day (QID) | ORAL | Status: AC | PRN

## 2011-06-20 NOTE — Telephone Encounter (Signed)
The patient's wife called informed the patient does not want a ortho. Referral but would like a prescription for arthritis. [facility] [place].

## 2011-06-20 NOTE — Telephone Encounter (Signed)
Done per emr 

## 2011-06-21 LAB — CBC
HCT: 28.4 — ABNORMAL LOW
HCT: 28.9 — ABNORMAL LOW
Hemoglobin: 9.5 — ABNORMAL LOW
Hemoglobin: 9.7 — ABNORMAL LOW
MCHC: 33.1
MCHC: 33.3
MCHC: 33.4
MCV: 91.9
Platelets: 208
RBC: 3.15 — ABNORMAL LOW
RDW: 17.6 — ABNORMAL HIGH
RDW: 17.8 — ABNORMAL HIGH
RDW: 18 — ABNORMAL HIGH

## 2011-06-21 LAB — BASIC METABOLIC PANEL
BUN: 50 — ABNORMAL HIGH
CO2: 22
CO2: 22
CO2: 23
Calcium: 9.3
Calcium: 9.5
Chloride: 116 — ABNORMAL HIGH
Creatinine, Ser: 2.82 — ABNORMAL HIGH
GFR calc non Af Amer: 21 — ABNORMAL LOW
GFR calc non Af Amer: 23 — ABNORMAL LOW
Glucose, Bld: 150 — ABNORMAL HIGH
Glucose, Bld: 213 — ABNORMAL HIGH
Glucose, Bld: 85
Potassium: 5.1
Sodium: 141
Sodium: 142
Sodium: 143

## 2011-06-21 LAB — PROTIME-INR: Prothrombin Time: 11.9

## 2011-06-21 NOTE — Procedures (Unsigned)
VASCULAR LAB EXAM  INDICATION:  Rule out outflow vein stenosis.  HISTORY: End-stage renal disease. Diabetes: Cardiac: Hypertension:  EXAM:  IMPRESSION:  Patent right forearm AVG (arteriovenous graft) with stenosis noted in the outflow vein with velocities up to 535 cm/s.  ___________________________________________ Conrad Teaticket, MD  EM/MEDQ  D:  06/15/2011  T:  06/15/2011  Job:  [id number]

## 2011-06-22 LAB — CBC
HCT: 25.3 — ABNORMAL LOW
HCT: 26.1 — ABNORMAL LOW
HCT: 28.6 — ABNORMAL LOW
HCT: 29.3 — ABNORMAL LOW
HCT: 30.1 — ABNORMAL LOW
HCT: 31.7 — ABNORMAL LOW
Hemoglobin: 10 — ABNORMAL LOW
Hemoglobin: 10.3 — ABNORMAL LOW
Hemoglobin: 8.9 — ABNORMAL LOW
Hemoglobin: 9.5 — ABNORMAL LOW
Hemoglobin: 9.7 — ABNORMAL LOW
Hemoglobin: 10.6 — ABNORMAL LOW
Hemoglobin: 11.2 — ABNORMAL LOW
MCHC: 32.6
MCHC: 32.8
MCHC: 33
MCHC: 33
MCHC: 33.5
MCHC: 33
MCHC: 33.5
MCV: 90.4
MCV: 91.8
MCV: 92.3
MCV: 92.4
MCV: 92.5
MCV: 92.6
MCV: 93.1
Platelets: 226
Platelets: 231
Platelets: 243
Platelets: 272
RBC: 3.07 — ABNORMAL LOW
RBC: 3.25 — ABNORMAL LOW
RBC: 3.27 — ABNORMAL LOW
RBC: 3.6 — ABNORMAL LOW
RBC: 3.51 — ABNORMAL LOW
RBC: 3.77 — ABNORMAL LOW
RDW: 16.6 — ABNORMAL HIGH
RDW: 17 — ABNORMAL HIGH
RDW: 17 — ABNORMAL HIGH
RDW: 17.2 — ABNORMAL HIGH
RDW: 17.4 — ABNORMAL HIGH
RDW: 17.6 — ABNORMAL HIGH
RDW: 17.9 — ABNORMAL HIGH
RDW: 17.2 — ABNORMAL HIGH
WBC: 6.9
WBC: 7.7
WBC: 8.3
WBC: 8.5
WBC: 9.3
WBC: 7.8

## 2011-06-22 LAB — BASIC METABOLIC PANEL
BUN: 50 — ABNORMAL HIGH
BUN: 53 — ABNORMAL HIGH
BUN: 56 — ABNORMAL HIGH
BUN: 59 — ABNORMAL HIGH
BUN: 59 — ABNORMAL HIGH
BUN: 62 — ABNORMAL HIGH
BUN: 62 — ABNORMAL HIGH
CO2: 17 — ABNORMAL LOW
CO2: 21
CO2: 22
CO2: 22
CO2: 22
CO2: 23
Calcium: 9.1
Calcium: 9.4
Calcium: 9.6
Calcium: 9.7
Chloride: 104
Chloride: 104
Chloride: 107
Chloride: 107
Chloride: 110
Chloride: 110
Chloride: 111
Chloride: 114 — ABNORMAL HIGH
Creatinine, Ser: 3.19 — ABNORMAL HIGH
Creatinine, Ser: 3.22 — ABNORMAL HIGH
Creatinine, Ser: 3.56 — ABNORMAL HIGH
Creatinine, Ser: 3.57 — ABNORMAL HIGH
Creatinine, Ser: 3.58 — ABNORMAL HIGH
Creatinine, Ser: 3.78 — ABNORMAL HIGH
Creatinine, Ser: 3.82 — ABNORMAL HIGH
GFR calc Af Amer: 21 — ABNORMAL LOW
GFR calc Af Amer: 21 — ABNORMAL LOW
GFR calc Af Amer: 23 — ABNORMAL LOW
GFR calc Af Amer: 24 — ABNORMAL LOW
GFR calc Af Amer: 24 — ABNORMAL LOW
GFR calc Af Amer: 24 — ABNORMAL LOW
GFR calc Af Amer: 28 — ABNORMAL LOW
GFR calc non Af Amer: 18 — ABNORMAL LOW
GFR calc non Af Amer: 19 — ABNORMAL LOW
GFR calc non Af Amer: 20 — ABNORMAL LOW
GFR calc non Af Amer: 20 — ABNORMAL LOW
GFR calc non Af Amer: 23 — ABNORMAL LOW
GFR calc non Af Amer: 23 — ABNORMAL LOW
Glucose, Bld: 113 — ABNORMAL HIGH
Glucose, Bld: 121 — ABNORMAL HIGH
Glucose, Bld: 125 — ABNORMAL HIGH
Glucose, Bld: 155 — ABNORMAL HIGH
Glucose, Bld: 169 — ABNORMAL HIGH
Glucose, Bld: 191 — ABNORMAL HIGH
Glucose, Bld: 93
Potassium: 4.3
Potassium: 4.4
Potassium: 4.7
Potassium: 4.8
Potassium: 5.3 — ABNORMAL HIGH
Sodium: 138
Sodium: 140
Sodium: 140
Sodium: 142
Sodium: 142

## 2011-06-22 LAB — I-STAT 8, (EC8 V) (CONVERTED LAB)
BUN: 56 — ABNORMAL HIGH
Chloride: 112
Glucose, Bld: 85
Hemoglobin: 11.9 — ABNORMAL LOW
Potassium: 4.7
Sodium: 139
pH, Ven: 7.397 — ABNORMAL HIGH

## 2011-06-22 LAB — COMPREHENSIVE METABOLIC PANEL
ALT: 13
Alkaline Phosphatase: 66
BUN: 49 — ABNORMAL HIGH
CO2: 23
GFR calc non Af Amer: 21 — ABNORMAL LOW
Glucose, Bld: 87
Potassium: 4.5
Sodium: 141

## 2011-06-22 LAB — CROSSMATCH: ABO/RH(D): O POS

## 2011-06-22 LAB — DIFFERENTIAL
Basophils Relative: 1
Lymphocytes Relative: 22
Lymphs Abs: 1.7
Monocytes Absolute: 0.7
Monocytes Relative: 9
Neutro Abs: 5.2
Neutrophils Relative %: 66

## 2011-06-22 LAB — BLOOD GAS, ARTERIAL
Acid-base deficit: 5.6 — ABNORMAL HIGH
Drawn by: 22257
FIO2: 0.7
MECHVT: 750
O2 Saturation: 97.4
Patient temperature: 98.6
RATE: 10
TCO2: 20.6

## 2011-06-22 LAB — POCT I-STAT CREATININE
Creatinine, Ser: 3.6 — ABNORMAL HIGH
Operator id: 161631

## 2011-06-22 LAB — CARDIAC PANEL(CRET KIN+CKTOT+MB+TROPI)
Total CK: 430 — ABNORMAL HIGH
Total CK: 455 — ABNORMAL HIGH
Troponin I: 0.06

## 2011-06-22 LAB — URIC ACID: Uric Acid, Serum: 7.7

## 2011-06-22 LAB — POCT CARDIAC MARKERS
CKMB, poc: 10.7
Myoglobin, poc: >500
Myoglobin, poc: >500
Operator id: 161631
Troponin i, poc: 0.06 — ABNORMAL HIGH

## 2011-06-22 LAB — CK TOTAL AND CKMB (NOT AT ARMC): CK, MB: 4.9 — ABNORMAL HIGH

## 2011-06-22 LAB — TSH: TSH: 0.806

## 2011-06-22 LAB — ABO/RH: ABO/RH(D): O POS

## 2011-06-22 LAB — APTT: aPTT: 31

## 2011-06-22 LAB — PROTIME-INR: INR: 1

## 2011-06-22 LAB — D-DIMER, QUANTITATIVE: D-Dimer, Quant: 2.76 — ABNORMAL HIGH

## 2011-06-22 LAB — OCCULT BLOOD X 1 CARD TO LAB, STOOL: Fecal Occult Bld: NEGATIVE

## 2011-06-22 LAB — HEPARIN LEVEL (UNFRACTIONATED): Heparin Unfractionated: 0.82 — ABNORMAL HIGH

## 2011-06-25 ENCOUNTER — Ambulatory Visit (HOSPITAL_COMMUNITY)
Admission: RE | Admit: 2011-06-25 | Discharge: 2011-06-25 | Disposition: A | Discharge status: Home / Self Care | Payer: Medicare HMO | Source: Ambulatory Visit | Attending: Vascular Surgery | Admitting: Vascular Surgery

## 2011-06-25 DIAGNOSIS — Y832 Surgical operation with anastomosis, bypass or graft as the cause of abnormal reaction of the patient, or of later complication, without mention of misadventure at the time of the procedure: Secondary | ICD-10-CM | POA: Insufficient documentation

## 2011-06-25 DIAGNOSIS — T82898A Other specified complication of vascular prosthetic devices, implants and grafts, initial encounter: Secondary | ICD-10-CM

## 2011-06-25 DIAGNOSIS — N186 End stage renal disease: Secondary | ICD-10-CM

## 2011-06-25 DIAGNOSIS — Y849 Medical procedure, unspecified as the cause of abnormal reaction of the patient, or of later complication, without mention of misadventure at the time of the procedure: Secondary | ICD-10-CM

## 2011-06-25 LAB — BASIC METABOLIC PANEL
CO2: 22
CO2: 24
Calcium: 9
Chloride: 110
GFR calc Af Amer: 24 — ABNORMAL LOW
GFR calc Af Amer: 25 — ABNORMAL LOW
GFR calc non Af Amer: 20 — ABNORMAL LOW
Glucose, Bld: 108 — ABNORMAL HIGH
Potassium: 4.5
Potassium: 4.5
Potassium: 4.6
Sodium: 139
Sodium: 140
Sodium: 141

## 2011-06-25 LAB — POCT I-STAT, CHEM 8
BUN: 61 mg/dL — ABNORMAL HIGH (ref 6–23)
Creatinine, Ser: 12 mg/dL — ABNORMAL HIGH (ref 0.50–1.35)
Sodium: 139 mEq/L (ref 135–145)
TCO2: 29 mmol/L (ref 0–100)

## 2011-06-25 LAB — CBC
HCT: 28.6 — ABNORMAL LOW
HCT: 29.6 — ABNORMAL LOW
Hemoglobin: 9.6 — ABNORMAL LOW
Hemoglobin: 9.6 — ABNORMAL LOW
Hemoglobin: 9.9 — ABNORMAL LOW
MCHC: 33.2
MCHC: 33.5
MCV: 90.2
RBC: 3.21 — ABNORMAL LOW
RBC: 3.29 — ABNORMAL LOW
WBC: 7.1

## 2011-06-25 LAB — HEPARIN LEVEL (UNFRACTIONATED)
Heparin Unfractionated: 1.23 — ABNORMAL HIGH
Heparin Unfractionated: 1.51 — ABNORMAL HIGH

## 2011-06-26 LAB — POCT I-STAT, CHEM 8
BUN: 80 mg/dL — ABNORMAL HIGH (ref 6–23)
Calcium, Ion: 0.99 mmol/L — ABNORMAL LOW (ref 1.12–1.32)
Creatinine, Ser: 12.4 mg/dL — ABNORMAL HIGH (ref 0.50–1.35)
Sodium: 136 mEq/L (ref 135–145)
TCO2: 28 mmol/L (ref 0–100)

## 2011-06-26 NOTE — Op Note (Signed)
[name], GRANNAN NO.:  [id number]  MEDICAL RECORD NO.:  [id number]  LOCATION:  [facility]                         FACILITY:  [facility]  PHYSICIAN:  Conrad [place], MD       DATE OF BIRTH:  01-May-1951  DATE OF PROCEDURE:  06/25/2011 DATE OF DISCHARGE:  06/25/2011                              OPERATIVE REPORT   PROCEDURES: 1. Right forearm arteriovenous graft cannulation under ultrasound     guidance. 2. Right arm shuntogram. 3. Angioplasty of the right brachial vein x8.  PREOPERATIVE DIAGNOSIS:  Venous outflow stenosis.  POSTOPERATIVE DIAGNOSIS:  Venous outflow stenosis.  SURGEON:  Conrad Rumson, MD  ESTIMATED BLOOD LOSS:  Minimal.  ANESTHESIA:  Local with contrast 50 mL.  SPECIMENS:  None.  FINDINGS: 1. A patent right forearm arteriovenous graft throughout, including     the arterial anastomosis. 2. Brachial vein stenosis at the level of the elbow (about 50%)     and at the distal third of the arm (75-95%) which reduced down to      less than 30% residual after angioplasty, with the majority of the       stenoses resolved. 3. There is a patent right axillary, subclavian, and innominate veins and a     patent superior vena cava.  INDICATIONS:  This is a 60 year old gentleman who I have been following long-term wise for a right forearm arteriovenous graft which functions poorly intermittently due to recurrence of venous outflow stenosis, recently had increase in his velocities.  He is on surveillance studies and subsequently I felt the intervention was going to be necessary.  He is aware of the risks of this procedure include bleeding, infection and possible anaphylactic reaction to the dye, possible rupture of any treated segments, possible thrombosis of this graft and possible need for additional procedures in the future.  He is aware of these risks and agreed to proceed forward.  DESCRIPTION OF OPERATION:  After full informed written consent  was obtained from the patient, he was brought back to the angio suite, placed supine upon the angio table, prior to proceeding, he was connected to the monitoring equipment, he was then prepped and draped in the standard fashion for right arm shuntogram.  I turned my attention to his forearm loop.  I cannulated the apex of this graft after anesthetizing the skin with a micropuncture needle.  I then passed a micro wire into the graft and then toward the venous arm of this graft. I then exchanged the needle for the micro sheath and then connected this to IV extension tubing.  The shuntogram was completed.  Note, the central images were not readily evident initially and in the fact there appeared to be like a large competing collateral at the level of the subclavian vein.  At this point, however, I turned my attention to treating the brachial vein stenoses.  I passed a Bentson wire through this segment without any problem and landed the wire into the subclavian vein, first I used a 4-mm x 20-mm balloon which I used to treat the elbow stenosis and then the distal third of the arm stenosis overlapping inflations in  the distal third for total of 3 separate inflations.  I then did a hand injection.  There was greater than 30% residual stenosis, then at this point, I placed a 6-mm x 20-mm balloon which I used to treat the area at the elbow.  This resulted in less than 30% residual stenosis.  At this point, then I obtained a 6-mm x 60-mm balloon which I used to treat the distal third stenosis.  Completion imaging demonstrated a resolution of most of the lesion at the elbow  stenosis and resolution of most of the distal third stenosis, especially its mid-segment.  I then placed a 6 mm x 20 mm balloon at 2 areas that  continued narrowing in this distal third lesion and then reinflated it  to 10 atmospheres holding for a minute.  Hand injections demonstrated  there was still residual stenosis near  its bifurcation into the second  brachial vein. Subsequently, I elected to treat this area, again  with the 6 mm x 20 mm balloon. After holding pressure at 10 atmospheres  for greater than minute, on completion imaging, there was less than 30% residual stenosis.  I then using this balloon as the catheter advanced into the central venous system and I did hand a hand injection which demonstrated no obvious stenosis in the innominate vein or the subclavian vein, so I am not certain exactly the etiology for the collateral that previously was imaged, but on the central imaging, I was not able to visualize this.  At this point, no further intervention was planned.  We compressed the venous arm and then did a refluxing injection and this demonstrated the arterial arm of this graft was without any obvious lesions and at this point, pulled out all wires and balloons and then injected some local anesthetic around the sheath.  A 4-0 Monocryl pursestring suture was placed around the sheath and then the pursestring suture was tied down while pulling out the sheath.  There was no hematoma at the site.  Sterile bandage was applied and some gentle pressure held.  The patient tolerated this procedure well.  COMPLICATIONS:  None.  CONDITION:  Stable.     Conrad Rayville, MD     BLC/MEDQ  D:  06/25/2011  T:  06/25/2011  Job:  [id number]  Electronically Signed by Adele Barthel MD on 06/26/2011 10:47:24 AM

## 2011-06-28 ENCOUNTER — Other Ambulatory Visit: Payer: Self-pay | Admitting: Internal Medicine

## 2011-07-01 ENCOUNTER — Observation Stay (HOSPITAL_COMMUNITY)
Admission: EM | Admit: 2011-07-01 | Discharge: 2011-07-01 | Disposition: A | Discharge status: Home / Self Care | Payer: Medicare HMO | Attending: Vascular Surgery | Admitting: Vascular Surgery

## 2011-07-01 ENCOUNTER — Emergency Department (HOSPITAL_COMMUNITY): Payer: Medicare HMO

## 2011-07-01 DIAGNOSIS — Y849 Medical procedure, unspecified as the cause of abnormal reaction of the patient, or of later complication, without mention of misadventure at the time of the procedure: Secondary | ICD-10-CM | POA: Insufficient documentation

## 2011-07-01 DIAGNOSIS — N186 End stage renal disease: Secondary | ICD-10-CM | POA: Insufficient documentation

## 2011-07-01 DIAGNOSIS — T82898A Other specified complication of vascular prosthetic devices, implants and grafts, initial encounter: Principal | ICD-10-CM | POA: Insufficient documentation

## 2011-07-01 LAB — CBC
HCT: 37.6 % — ABNORMAL LOW (ref 39.0–52.0)
Hemoglobin: 12.1 g/dL — ABNORMAL LOW (ref 13.0–17.0)
MCH: 30.4 pg (ref 26.0–34.0)
MCHC: 32.2 g/dL (ref 30.0–36.0)
MCV: 94.5 fL (ref 78.0–100.0)

## 2011-07-01 LAB — POCT I-STAT, CHEM 8
BUN: 70 mg/dL — ABNORMAL HIGH (ref 6–23)
Creatinine, Ser: 12.6 mg/dL — ABNORMAL HIGH (ref 0.50–1.35)
Sodium: 142 mEq/L (ref 135–145)
TCO2: 26 mmol/L (ref 0–100)

## 2011-07-01 LAB — DIFFERENTIAL
Basophils Relative: 1 % (ref 0–1)
Lymphocytes Relative: 27 % (ref 12–46)
Lymphs Abs: 2.3 10*3/uL (ref 0.7–4.0)
Monocytes Absolute: 0.8 10*3/uL (ref 0.1–1.0)
Monocytes Relative: 10 % (ref 3–12)
Neutro Abs: 4.9 10*3/uL (ref 1.7–7.7)

## 2011-07-02 LAB — CBC
HCT: 27.4 — ABNORMAL LOW
HCT: 27.4 — ABNORMAL LOW
HCT: 29.5 — ABNORMAL LOW
Hemoglobin: 9.1 — ABNORMAL LOW
Hemoglobin: 9.2 — ABNORMAL LOW
Hemoglobin: 9.6 — ABNORMAL LOW
MCHC: 33.2
MCV: 90.5
RBC: 3.02 — ABNORMAL LOW
RBC: 3.07 — ABNORMAL LOW
RBC: 3.28 — ABNORMAL LOW
RDW: 17.2 — ABNORMAL HIGH
RDW: 17.6 — ABNORMAL HIGH
WBC: 6.1
WBC: 7.2

## 2011-07-02 LAB — GLUCOSE, CAPILLARY
Glucose-Capillary: 112 — ABNORMAL HIGH
Glucose-Capillary: 114 — ABNORMAL HIGH
Glucose-Capillary: 119 — ABNORMAL HIGH
Glucose-Capillary: 128 — ABNORMAL HIGH
Glucose-Capillary: 130 — ABNORMAL HIGH
Glucose-Capillary: 131 — ABNORMAL HIGH
Glucose-Capillary: 139 — ABNORMAL HIGH
Glucose-Capillary: 151 — ABNORMAL HIGH
Glucose-Capillary: 151 — ABNORMAL HIGH
Glucose-Capillary: 158 — ABNORMAL HIGH
Glucose-Capillary: 158 — ABNORMAL HIGH
Glucose-Capillary: 171 — ABNORMAL HIGH
Glucose-Capillary: 195 — ABNORMAL HIGH

## 2011-07-02 LAB — RENAL FUNCTION PANEL
BUN: 81 — ABNORMAL HIGH
CO2: 21
Calcium: 9.5
Chloride: 106
Chloride: 109
GFR calc Af Amer: 13 — ABNORMAL LOW
GFR calc non Af Amer: 11 — ABNORMAL LOW
Glucose, Bld: 125 — ABNORMAL HIGH
Phosphorus: 6.9 — ABNORMAL HIGH
Potassium: 4
Potassium: 4.2
Sodium: 140
Sodium: 143

## 2011-07-02 LAB — DIFFERENTIAL
Basophils Relative: 1
Eosinophils Relative: 3
Lymphocytes Relative: 28
Monocytes Absolute: 0.9
Monocytes Relative: 13 — ABNORMAL HIGH
Neutro Abs: 3.9

## 2011-07-02 LAB — COMPREHENSIVE METABOLIC PANEL
AST: 22
Albumin: 3.2 — ABNORMAL LOW
Alkaline Phosphatase: 81
BUN: 79 — ABNORMAL HIGH
Chloride: 116 — ABNORMAL HIGH
GFR calc Af Amer: 13 — ABNORMAL LOW
Potassium: 4.7
Total Protein: 6.9

## 2011-07-02 LAB — IRON AND TIBC: TIBC: 269

## 2011-07-02 LAB — TROPONIN I: Troponin I: 0.03

## 2011-07-02 LAB — LIPID PANEL
HDL: 33 — ABNORMAL LOW
Total CHOL/HDL Ratio: 10.3
Triglycerides: 417 — ABNORMAL HIGH
VLDL: 69 — ABNORMAL HIGH
VLDL: UNDETERMINED

## 2011-07-02 LAB — POCT I-STAT 4, (NA,K, GLUC, HGB,HCT)
HCT: 30 — ABNORMAL LOW
Sodium: 141

## 2011-07-02 LAB — CK TOTAL AND CKMB (NOT AT ARMC)
CK, MB: 5.2 — ABNORMAL HIGH
Relative Index: 0.9

## 2011-07-02 LAB — PTH-RELATED PEPTIDE: PTH-related peptide: <0.2 (ref <1.4)

## 2011-07-02 LAB — T4: T4, Total: 5.6

## 2011-07-04 NOTE — Op Note (Signed)
NAMEKENNARD, [name]                 ACCOUNT NO.:  [id number]  MEDICAL RECORD NO.:  [id number]  LOCATION:  [id number]                         FACILITY:  [facility]  PHYSICIAN:  Jessy Oto. Anvith Mauriello, MD  DATE OF BIRTH:  Mar 25, 1951  DATE OF PROCEDURE:  07/01/2011 DATE OF DISCHARGE:                              OPERATIVE REPORT   PROCEDURE:  Thrombectomy and revision, right forearm arteriovenous graft.  PREOPERATIVE DIAGNOSIS:  Thrombosed right forearm arteriovenous graft.  POSTOPERATIVE DIAGNOSIS:  Thrombosed right forearm arteriovenous graft.  ANESTHESIA:  General.  ASSISTANT:  Nurse.  OPERATIVE FINDINGS:  Severe intimal hyperplasia extending over several centimeters of venous outflow vein revised with 6-mm interposition graft.  OPERATIVE DETAILS:  After obtaining informed consent, the patient was taken to the operating room.  The patient was placed in the supine position on the operating table.  After adequate sedation, the patient's entire right upper extremity was prepped and draped in the usual sterile fashion.  Local anesthesia was infiltrated over a preexisting longitudinal scar just above the antecubital crease.  Incision was opened sharply and carried down through subcutaneous tissues down the level of the graft.  The patient was having some airway obstruction-type symptoms at this point and it was decided that to protect his airway would be safer to commence a general anesthesia and this was commenced and a laryngeal mask was placed.  The patient tolerated the procedure well from an anesthetic standpoint from this portion of the case onward. At this point, the incision was deepened down to the level of the preexisting venous limb of the AV graft and this was dissected free circumferentially.  Dissection was carried up on the level of venous anastomosis.  The vein was small and sclerotic.  Dissection was carried several centimeters higher up to a more normal-appearing  reasonable quality vein.  This required extending the incision to the mid upper arm.  The vein was dissected free circumferentially and small side branches were ligated and divided between silk ties.  There were two large side branches which were controlled with vessel loops.  The patient was given 7000 units of intravenous heparin.  A transverse graftotomy was made just above the level of venous anastomosis.  The arteriotomy was extended up through the venous anastomosis and into the native vein.  This was severely sclerotic with severe intimal hyperplasia with multiple areas of greater than 90% stenosis.  The vein was debrided back to a more healthy-appearing better quality vein that was approximately 3 mm in diameter.  This required revision of the graft approximately 5 cm higher.  A 6-mm interposition graft was brought up in the operative field and beveled and sewn end to end to the vein using a running 6-0 Prolene suture.  At completion of anastomosis, there was a good venous backbleeding from this and this was thoroughly flushed with heparinized saline and then occluded at the level of venous anastomosis with a fistula clamp.  I then attempted to thrombectomize the arterial limb of the graft.  #3, #4, and #5 Fogarty catheters were used and passed multiple times to declot the arterial limb.  However, there was only a trickle of flow.  Therefore, through  the same longitudinal incision in the arm, the arterial limb of the graft was exposed just above the level of the arterial anastomosis.  It was not pulsatile.  A transverse graftotomy was made just above the level of the arterial anastomosis and a #3 Fogarty catheter passed through the arterial anastomosis and an arterialized plug was retrieved and there was brisk arterial inflow at this point.  The graft was clamped proximally with fistula clamp and the graftotomy repaired with a running 6-0 Prolene suture.  There was good pulsatile  flow at this point all the way down to the level of the old venous limb of the graft.  This was clamped with a fistula clamp.  The new 6-mm interposition graft was then cut to length for anastomosis to the old graft and this was done using a running 6-0 Prolene suture.  Just prior to completion of anastomosis, this was forebled, backbled, and thoroughly flushed.  Anastomosis was secured and clamps were released.  There was good flow through the graft immediately.  The patient had an audible radial Doppler signal and good Doppler flow through the graft distally.  Hemostasis was obtained with the assistance of 70 mg of protamine, thrombin, and Gelfoam.  After hemostasis had been obtained, the subcutaneous tissues were closed in multiple layers of 2-0 and 3-0 Vicryl suture and the skin was closed with a 4-0 Vicryl subcuticular stitch.  Dermabond was applied.  The patient tolerated the procedure well and there were no complications. Instrument, sponge, and needle counts were correct at the end of the case.  The patient was taken to the recovery room in stable condition.     Jessy Oto. Mintie Witherington, MD     CEF/MEDQ  D:  07/01/2011  T:  07/01/2011  Job:  [id number]  Electronically Signed by [name] Hinds MD on 07/04/2011 11:42:24 AM

## 2011-07-11 LAB — DIFFERENTIAL
Eosinophils Absolute: 0.1
Eosinophils Relative: 2
Lymphs Abs: 2.3
Monocytes Relative: 11

## 2011-07-11 LAB — BASIC METABOLIC PANEL
BUN: 48 — ABNORMAL HIGH
BUN: 49 — ABNORMAL HIGH
BUN: 49 — ABNORMAL HIGH
CO2: 23
Calcium: 9.6
Calcium: 9.7
Creatinine, Ser: 2.41 — ABNORMAL HIGH
Creatinine, Ser: 2.59 — ABNORMAL HIGH
Creatinine, Ser: 2.68 — ABNORMAL HIGH
Creatinine, Ser: 2.78 — ABNORMAL HIGH
GFR calc Af Amer: 30 — ABNORMAL LOW
GFR calc Af Amer: 34 — ABNORMAL LOW
GFR calc non Af Amer: 24 — ABNORMAL LOW
GFR calc non Af Amer: 25 — ABNORMAL LOW
Glucose, Bld: 121 — ABNORMAL HIGH
Glucose, Bld: 124 — ABNORMAL HIGH
Potassium: 4.6
Potassium: 4.6
Sodium: 141

## 2011-07-11 LAB — CK TOTAL AND CKMB (NOT AT ARMC): Total CK: 506 — ABNORMAL HIGH

## 2011-07-11 LAB — CARDIAC PANEL(CRET KIN+CKTOT+MB+TROPI)
Relative Index: 1.5
Total CK: 469 — ABNORMAL HIGH
Total CK: 480 — ABNORMAL HIGH
Troponin I: 0.03
Troponin I: 0.04

## 2011-07-11 LAB — I-STAT 8, (EC8 V) (CONVERTED LAB)
BUN: 53 — ABNORMAL HIGH
Glucose, Bld: 138 — ABNORMAL HIGH
Hemoglobin: 11.9 — ABNORMAL LOW
Potassium: 4.6
Sodium: 141
pH, Ven: 7.409 — ABNORMAL HIGH

## 2011-07-11 LAB — COMPREHENSIVE METABOLIC PANEL
ALT: 21
AST: 18
CO2: 19
Calcium: 9.5
GFR calc Af Amer: 30 — ABNORMAL LOW
GFR calc non Af Amer: 25 — ABNORMAL LOW
Potassium: 4.6
Sodium: 139

## 2011-07-11 LAB — CBC
HCT: 31.5 — ABNORMAL LOW
HCT: 34.3 — ABNORMAL LOW
Hemoglobin: 11.3 — ABNORMAL LOW
MCHC: 32.8
MCHC: 32.9
MCV: 91.3
Platelets: 276
RBC: 3.6 — ABNORMAL LOW
RBC: 3.76 — ABNORMAL LOW
RDW: 16.8 — ABNORMAL HIGH
RDW: 17 — ABNORMAL HIGH
WBC: 6.5

## 2011-07-11 LAB — URINE MICROSCOPIC-ADD ON

## 2011-07-11 LAB — POCT CARDIAC MARKERS
CKMB, poc: 10.9
Myoglobin, poc: >500
Troponin i, poc: <0.05

## 2011-07-11 LAB — URINALYSIS, ROUTINE W REFLEX MICROSCOPIC
Leukocytes, UA: NEGATIVE
Nitrite: NEGATIVE
Specific Gravity, Urine: 1.017
Urobilinogen, UA: 0.2
pH: 5.5

## 2011-07-11 LAB — URINE CULTURE: Culture: NO GROWTH

## 2011-07-13 LAB — COMPREHENSIVE METABOLIC PANEL
Albumin: 2.9 — ABNORMAL LOW
BUN: 41 — ABNORMAL HIGH
Calcium: 9.3
Creatinine, Ser: 2.75 — ABNORMAL HIGH
Total Protein: 6

## 2011-07-13 LAB — CBC
HCT: 32 — ABNORMAL LOW
Hemoglobin: 10.5 — ABNORMAL LOW
Hemoglobin: 9.9 — ABNORMAL LOW
MCHC: 33
MCV: 91.3
MCV: 91.6
Platelets: 267
Platelets: 270
RBC: 3.14 — ABNORMAL LOW
RBC: 3.26 — ABNORMAL LOW
RBC: 3.41 — ABNORMAL LOW
RBC: 3.46 — ABNORMAL LOW
RDW: 16.3 — ABNORMAL HIGH
RDW: 16.6 — ABNORMAL HIGH
WBC: 6.8
WBC: 7.1

## 2011-07-13 LAB — RENAL FUNCTION PANEL
CO2: 27
Chloride: 108
Creatinine, Ser: 2.6 — ABNORMAL HIGH
GFR calc Af Amer: 31 — ABNORMAL LOW
GFR calc non Af Amer: 25 — ABNORMAL LOW
Potassium: 4

## 2011-07-13 LAB — BASIC METABOLIC PANEL
CO2: 24
CO2: 27
Calcium: 8.6
Calcium: 9.1
Calcium: 9.6
Chloride: 104
Chloride: 110
Creatinine, Ser: 2.32 — ABNORMAL HIGH
Creatinine, Ser: 2.41 — ABNORMAL HIGH
Creatinine, Ser: 2.6 — ABNORMAL HIGH
GFR calc Af Amer: 31 — ABNORMAL LOW
GFR calc Af Amer: 34 — ABNORMAL LOW
GFR calc Af Amer: 35 — ABNORMAL LOW
GFR calc Af Amer: 35 — ABNORMAL LOW
GFR calc Af Amer: 37 — ABNORMAL LOW
GFR calc non Af Amer: 28 — ABNORMAL LOW
GFR calc non Af Amer: 29 — ABNORMAL LOW
GFR calc non Af Amer: 29 — ABNORMAL LOW
GFR calc non Af Amer: 30 — ABNORMAL LOW
Glucose, Bld: 153 — ABNORMAL HIGH
Potassium: 4.3
Potassium: 5.1
Sodium: 140
Sodium: 144

## 2011-07-13 LAB — DIFFERENTIAL
Basophils Absolute: 0
Basophils Relative: 1
Eosinophils Absolute: 0.1
Lymphs Abs: 2.1
Neutrophils Relative %: 50

## 2011-07-13 LAB — PROTIME-INR: INR: 0.8

## 2011-07-13 LAB — APTT
aPTT: 29
aPTT: 32

## 2011-07-13 LAB — HEMOGLOBIN A1C
Hgb A1c MFr Bld: 8.3 — ABNORMAL HIGH
Mean Plasma Glucose: 218

## 2011-07-13 LAB — LIPID PANEL
HDL: 47
Triglycerides: 227 — ABNORMAL HIGH
VLDL: 45 — ABNORMAL HIGH

## 2011-07-13 LAB — CARDIAC PANEL(CRET KIN+CKTOT+MB+TROPI)
Relative Index: 0.6
Troponin I: 0.06

## 2011-07-13 LAB — CK TOTAL AND CKMB (NOT AT ARMC)
CK, MB: 5 — ABNORMAL HIGH
Total CK: 1188 — ABNORMAL HIGH

## 2011-07-13 LAB — PHOSPHORUS: Phosphorus: 4

## 2011-07-17 LAB — I-STAT 8, (EC8 V) (CONVERTED LAB)
Acid-base deficit: 7 — ABNORMAL HIGH
Chloride: 114 — ABNORMAL HIGH
HCT: 39
Hemoglobin: 13.3
Operator id: 239701
Potassium: 5.8 — ABNORMAL HIGH
Sodium: 140
TCO2: 20

## 2011-07-17 LAB — BASIC METABOLIC PANEL
CO2: 22
Calcium: 10.7 — ABNORMAL HIGH
GFR calc Af Amer: 37 — ABNORMAL LOW
GFR calc non Af Amer: 30 — ABNORMAL LOW
Sodium: 140

## 2011-07-17 LAB — URIC ACID
Uric Acid, Serum: 7
Uric Acid, Serum: 7.2 — ABNORMAL HIGH

## 2011-07-17 LAB — PSA: PSA: 0.63

## 2011-07-17 LAB — RENAL FUNCTION PANEL
Albumin: 3.1 — ABNORMAL LOW
BUN: 41 — ABNORMAL HIGH
Calcium: 9.4
Phosphorus: 4.4
Potassium: 4.4
Sodium: 139

## 2011-07-17 LAB — URINALYSIS, ROUTINE W REFLEX MICROSCOPIC
Glucose, UA: 100 — AB
Urobilinogen, UA: 0.2
pH: 6

## 2011-07-17 LAB — CK TOTAL AND CKMB (NOT AT ARMC): Total CK: 497 — ABNORMAL HIGH

## 2011-07-17 LAB — CBC
Platelets: 207
RDW: 16.7 — ABNORMAL HIGH

## 2011-07-17 LAB — POCT CARDIAC MARKERS
Troponin i, poc: <0.05
Troponin i, poc: <0.05

## 2011-07-17 LAB — DIFFERENTIAL
Basophils Absolute: 0
Lymphocytes Relative: 31
Neutro Abs: 3.4
Neutrophils Relative %: 56

## 2011-07-17 LAB — URINE MICROSCOPIC-ADD ON

## 2011-07-17 LAB — POCT I-STAT 3, ART BLOOD GAS (G3+)
TCO2: 22
pCO2 arterial: 37.2
pH, Arterial: 7.35

## 2011-07-17 LAB — POCT I-STAT CREATININE: Operator id: 239701

## 2011-07-17 LAB — HEMOGLOBIN A1C: Hgb A1c MFr Bld: 8.9 — ABNORMAL HIGH

## 2011-07-17 LAB — TROPONIN I: Troponin I: 0.05

## 2011-09-07 ENCOUNTER — Other Ambulatory Visit: Payer: Self-pay | Admitting: Internal Medicine

## 2011-09-17 ENCOUNTER — Other Ambulatory Visit (HOSPITAL_COMMUNITY): Payer: Self-pay | Admitting: Nephrology

## 2011-09-17 DIAGNOSIS — N186 End stage renal disease: Secondary | ICD-10-CM

## 2011-09-19 ENCOUNTER — Other Ambulatory Visit: Payer: Self-pay

## 2011-09-19 ENCOUNTER — Other Ambulatory Visit (HOSPITAL_COMMUNITY): Payer: Self-pay | Admitting: Gastroenterology

## 2011-09-19 MED ORDER — SODIUM CHLORIDE 0.9 % IJ SOLN: 3.0000 mL | INTRAMUSCULAR | Status: DC | PRN

## 2011-09-20 ENCOUNTER — Encounter (HOSPITAL_COMMUNITY)
Admission: RE | Disposition: A | Discharge status: Home / Self Care | Payer: Self-pay | Source: Ambulatory Visit | Attending: Vascular Surgery

## 2011-09-20 ENCOUNTER — Ambulatory Visit (HOSPITAL_COMMUNITY)
Admission: RE | Admit: 2011-09-20 | Discharge: 2011-09-20 | Disposition: A | Discharge status: Home / Self Care | Payer: Medicare HMO | Source: Ambulatory Visit | Attending: Vascular Surgery | Admitting: Vascular Surgery

## 2011-09-20 DIAGNOSIS — Y832 Surgical operation with anastomosis, bypass or graft as the cause of abnormal reaction of the patient, or of later complication, without mention of misadventure at the time of the procedure: Secondary | ICD-10-CM | POA: Insufficient documentation

## 2011-09-20 DIAGNOSIS — T82898A Other specified complication of vascular prosthetic devices, implants and grafts, initial encounter: Secondary | ICD-10-CM

## 2011-09-20 DIAGNOSIS — I871 Compression of vein: Secondary | ICD-10-CM | POA: Insufficient documentation

## 2011-09-20 DIAGNOSIS — N186 End stage renal disease: Secondary | ICD-10-CM | POA: Insufficient documentation

## 2011-09-20 DIAGNOSIS — E119 Type 2 diabetes mellitus without complications: Secondary | ICD-10-CM | POA: Insufficient documentation

## 2011-09-20 DIAGNOSIS — I120 Hypertensive chronic kidney disease with stage 5 chronic kidney disease or end stage renal disease: Secondary | ICD-10-CM | POA: Insufficient documentation

## 2011-09-20 HISTORY — PX: SHUNTOGRAM: SHX5491

## 2011-09-20 SURGERY — ASSESSMENT, SHUNT FUNCTION, WITH CONTRAST RADIOGRAPHIC STUDY
Anesthesia: LOCAL | Laterality: Right

## 2011-09-20 MED ORDER — LIDOCAINE HCL (PF) 1 % IJ SOLN
INTRAMUSCULAR | Status: AC
  Filled 2011-09-20: qty 30

## 2011-09-20 MED ORDER — HEPARIN SODIUM (PORCINE) 1000 UNIT/ML IJ SOLN
INTRAMUSCULAR | Status: AC
  Filled 2011-09-20: qty 1

## 2011-09-20 MED ORDER — HEPARIN (PORCINE) IN NACL 2-0.9 UNIT/ML-% IJ SOLN
INTRAMUSCULAR | Status: AC
  Filled 2011-09-20: qty 1000

## 2011-09-20 NOTE — Op Note (Signed)
OPERATIVE NOTE   PROCEDURE: 1. right forearm arteriovenous graft cannulation under ultrasound guidance 2. right arm shuntogram 3. Angioplasty of arteriovenous graft and brachial vein x 3  PRE-OPERATIVE DIAGNOSIS: Malfunctioning right arteriovenous graft  POST-OPERATIVE DIAGNOSIS: same as above   SURGEON: Adele Barthel, MD  ANESTHESIA: local  ESTIMATED BLOOD LOSS: 5 cc  FINDING(S): 1. 70% in-graft stenosis distally (prior jump graft anastomosis): < 30% after angioplasty 2. 50% stenosis at graft-brachial vein anastomosis: resolved after angioplasty 3. Widely patent central vein  SPECIMEN(S):  None  CONTRAST: 35 cc  INDICATIONS: [name] is a 60 y.o. male who  presents with malfunctioning right forearm arteriovenous graft with poor flow rates.  The patient is scheduled for right arm shuntogram, possible intervention.  The patient is aware the risks include but are not limited to: bleeding, infection, thrombosis of the cannulated access, and possible anaphylactic reaction to the contrast.  The patient is aware of the risks of the procedure and elects to proceed forward.  DESCRIPTION: After full informed written consent was obtained, the patient was brought back to the angiography suite and placed supine upon the angiography table.  The patient was connected to monitoring equipment.  The right forearm was prepped and draped in the standard fashion for a right arm shuntogram.  Under ultrasound guidance, the right forearm arteriovenous graft was cannulated with a micropuncture needle.  The microwire was advanced into the fistula and the needle was exchanged for the a microsheath, which was lodged 2 cm into the access.  The wire was removed and the sheath was connected to the IV extension tubing.  Hand injections were completed to image the access from the antecubitum up to the level of axilla.  The central venous structures were also imaged by hand injections.  Based on the images, this  patient will need: angioplasty of the stenoses, both at jump graft anastomoses.   The patient was given 3000 units of Heparin intravenously.  A Benson wire was passed through the sheath up into the axillary vein.  The sheath was exchanged for a 6-Fr short sheath.  Both stenoses were treated with a 6.0 mm x 20 mm angioplasty balloon at 12 atm for 1 minute.  The distal lesion appeared adequately treated but I felt the proximal lesion needed additional dilation.  I exchanged the balloon for a 7.0 mm x 20 mm angioplasty balloon which was inflated to 12 atm for 1 minute.  The balloon was deflated and removed.  The completion angiogram demonstrated complete resolution of the stenosis.    The wire was removed and a 4-0 Monocryl purse-string suture was sewn around the sheath.  The sheath was removed while tying down the suture.  A sterile bandage was applied to the puncture site.  COMPLICATIONS: none  CONDITION: stable  Adele Barthel, MD [facility] Office: [phone] Pager: [phone]  09/20/2011 11:09 AM

## 2011-09-20 NOTE — H&P (Signed)
[facility]  Brief Access History and Physical  History of Present Illness  [name] is a 60 y.o. male who presents with chief complaint: right forearm AVG malfunction.  The patient presents today for right arm shuntogram, possible intervention.    Past Medical History  Diagnosis Date  . Hyperthyroidism   . GASTROENTERITIS, VIRAL 10/14/2009  . ONYCHOMYCOSIS, TOENAILS 12/26/2007  . GOITER, MULTINODULAR 12/26/2007  . HYPERTHYROIDISM 02/02/2010  . DIABETES MELLITUS, TYPE II 02/01/2010  . DYSLIPIDEMIA 06/18/2007  . GOUT 06/18/2007  . Hypocalcemia 06/07/2010  . DEMENTIA 09/02/2009  . DEPRESSION 10/14/2009  . PERIPHERAL NEUROPATHY 06/18/2007  . HYPERTENSION 06/18/2007  . CAD, NATIVE VESSEL 02/06/2009  . CONGESTIVE HEART FAILURE 06/18/2007  . DIASTOLIC HEART FAILURE, CHRONIC 02/06/2009  . Unspecified hypotension 01/30/2010  . PULMONARY NODULE, RIGHT LOWER LOBE 06/08/2009  . GERD 06/18/2007  . ESRD 08/04/2010  . BENIGN PROSTATIC HYPERTROPHY 10/14/2009  . GYNECOMASTIA 07/17/2010  . NECK PAIN 07/31/2010  . FOOT PAIN 08/12/2008  . DIZZINESS 07/17/2010  . Other malaise and fatigue 11/24/2009  . GAIT DISTURBANCE 03/03/2010  . DYSPNEA 10/29/2008  . CHEST PAIN 03/29/2010  . TRANSAMINASES, SERUM, ELEVATED 02/01/2010  . COLONIC POLYPS, HX OF 10/14/2009  . Anemia 06/16/2011  . Ischemic cardiomyopathy 06/16/2011  . Hyperparathyroidism, secondary 06/16/2011    Past Surgical History  Procedure Date  . Stent 06/11/08   . Neck sugury 2/09  . Back surgury 1998    History   Social History  . Marital Status: Married    Spouse Name: N/A    Number of Children: N/A  . Years of Education: N/A   Occupational History  . disabled   . formerly Lawyer for [employer].    Social History Main Topics  . Smoking status: Former Research scientist (life sciences)  . Smokeless tobacco: Not on file   Comment: Quit smoking 2007 Smoked x 25 years 1/2 ppd.  . Alcohol Use: Not on file  . Drug Use: No  . Sexually  Active: Not on file   Other Topics Concern  . Not on file   Social History Narrative  . No narrative on file    Family History  Problem Relation Age of Onset  . Heart disease Father   . Heart disease Sister     No current facility-administered medications on file prior to encounter.   Current Outpatient Prescriptions on File Prior to Encounter  Medication Sig Dispense Refill  . allopurinol (ZYLOPRIM) 100 MG tablet Take 100 mg by mouth daily.        [name] amitriptyline (ELAVIL) 50 MG tablet       . aspirin 81 MG EC tablet Take 81 mg by mouth daily.        [name] atorvastatin (LIPITOR) 10 MG tablet 1 by mouth every other day       . calcium-vitamin D (OSCAL WITH D) 500-200 MG-UNIT per tablet 2 by mouth three times per week       . carvedilol (COREG) 12.5 MG tablet TAKE 1 TABLET TWICE DAILY  180 tablet  1  . cinacalcet (SENSIPAR) 60 MG tablet Take 60 mg by mouth 2 (two) times daily.        . citalopram (CELEXA) 10 MG tablet       . clopidogrel (PLAVIX) 75 MG tablet Take 75 mg by mouth daily.        . cyclobenzaprine (FLEXERIL) 10 MG tablet 1 tab x a day as needed for pain  90 tablet  11  . epoetin alfa (EPOGEN) 3000 UNIT/ML injection Inject 3,000 Units into the skin 3 (three) times a week. At dialysis       . ezetimibe (ZETIA) 10 MG tablet Take 10 mg by mouth daily.        [name] gabapentin (NEURONTIN) 300 MG capsule Take 300 mg by mouth at bedtime as needed.        . methimazole (TAPAZOLE) 5 MG tablet 1 tablet 2 times a week       . multivitamin (RENA-VIT) TABS tablet Take 1 tablet by mouth daily.        . Omega-3 Fatty Acids (FISH OIL) 1000 MG CAPS Take by mouth 3 (three) times daily.        . ranitidine (ZANTAC) 300 MG capsule Take 300 mg by mouth daily.        [name] tiZANidine (ZANAFLEX) 4 MG tablet Take 1 tablet (4 mg total) by mouth 3 (three) times daily.  90 tablet  1  . WELCHOL 3.75 G PACK MIX 1 PACKET IN WATER AND DRINK ONE TIME DAILY  90 each  3    Allergies  Allergen Reactions  .  Cephalexin   . Statins     REACTION: perceived myalgias    Review of Systems: As listed above, otherwise negative.  Physical Examination  There were no vitals filed for this visit. There is no height or weight on file to calculate BMI.  General: A&O x 3, WDWN  Pulmonary: Sym exp, good air movt, CTAB, no rales, rhonchi, & wheezing  Cardiac: RRR, Nl S1, S2, no Murmurs, rubs or gallops  Gastrointestinal: soft, NTND, -G/R, - HSM, - masses, - CVAT B  Musculoskeletal: M/S 5/5 throughout , Extremities without ischemic changes , R forearm AVG with pulsatile character  Medical Decision Making  [name] is a 60 y.o. male who presents with: malfunctioning right forearm AVG.   The patient is scheduled for: right arm shuntogram, possible intervention  I discussed with the patient the nature of angiographic procedures, especially the limited patencies of any endovascular intervention.    The patient is aware of that the risks of an angiographic procedure include but are not limited to: bleeding, infection, access site complications, renal failure, embolization, rupture of vessel, dissection, possible need for emergent surgical intervention, possible need for surgical procedures to treat the patient's pathology, and stroke and death.    The patient is aware of the risks and agrees to proceed.  Adele Barthel, MD [facility] Office: [phone] Pager: [phone]

## 2011-09-21 ENCOUNTER — Encounter (HOSPITAL_COMMUNITY): Payer: Self-pay

## 2011-09-21 ENCOUNTER — Ambulatory Visit (HOSPITAL_COMMUNITY): Payer: Medicare HMO

## 2011-09-21 LAB — POCT I-STAT, CHEM 8
BUN: 67 mg/dL — ABNORMAL HIGH (ref 6–23)
Calcium, Ion: 1.1 mmol/L — ABNORMAL LOW (ref 1.12–1.32)
Chloride: 108 mEq/L (ref 96–112)
Creatinine, Ser: 11.2 mg/dL — ABNORMAL HIGH (ref 0.50–1.35)
Glucose, Bld: 169 mg/dL — ABNORMAL HIGH (ref 70–99)
HCT: 23 % — ABNORMAL LOW (ref 39.0–52.0)

## 2011-09-24 ENCOUNTER — Other Ambulatory Visit (HOSPITAL_COMMUNITY): Payer: Self-pay | Admitting: Internal Medicine

## 2011-09-24 ENCOUNTER — Other Ambulatory Visit: Payer: Self-pay | Admitting: Radiology

## 2011-09-24 DIAGNOSIS — N186 End stage renal disease: Secondary | ICD-10-CM

## 2011-09-26 ENCOUNTER — Other Ambulatory Visit: Payer: Self-pay

## 2011-09-26 ENCOUNTER — Other Ambulatory Visit (HOSPITAL_COMMUNITY): Payer: Self-pay | Admitting: Internal Medicine

## 2011-09-26 ENCOUNTER — Encounter (HOSPITAL_COMMUNITY): Payer: Self-pay

## 2011-09-26 ENCOUNTER — Inpatient Hospital Stay (HOSPITAL_COMMUNITY)
Admission: RE | Admit: 2011-09-26 | Discharge: 2011-09-26 | Payer: Medicare HMO | Source: Ambulatory Visit | Attending: Gastroenterology | Admitting: Gastroenterology

## 2011-09-26 ENCOUNTER — Inpatient Hospital Stay (HOSPITAL_COMMUNITY)
Admission: EM | Admit: 2011-09-26 | Discharge: 2011-10-01 | DRG: 811 | Disposition: A | Discharge status: Home / Self Care | Payer: Medicare HMO | Attending: Internal Medicine | Admitting: Internal Medicine

## 2011-09-26 ENCOUNTER — Telehealth: Payer: Self-pay | Admitting: *Deleted

## 2011-09-26 ENCOUNTER — Ambulatory Visit (HOSPITAL_COMMUNITY)
Admission: RE | Admit: 2011-09-26 | Discharge: 2011-09-26 | Disposition: A | Discharge status: Home / Self Care | Payer: Medicare HMO | Source: Ambulatory Visit | Attending: Internal Medicine | Admitting: Internal Medicine

## 2011-09-26 DIAGNOSIS — K219 Gastro-esophageal reflux disease without esophagitis: Secondary | ICD-10-CM

## 2011-09-26 DIAGNOSIS — R079 Chest pain, unspecified: Secondary | ICD-10-CM

## 2011-09-26 DIAGNOSIS — I5032 Chronic diastolic (congestive) heart failure: Secondary | ICD-10-CM

## 2011-09-26 DIAGNOSIS — M79609 Pain in unspecified limb: Secondary | ICD-10-CM

## 2011-09-26 DIAGNOSIS — Z01812 Encounter for preprocedural laboratory examination: Secondary | ICD-10-CM

## 2011-09-26 DIAGNOSIS — N400 Enlarged prostate without lower urinary tract symptoms: Secondary | ICD-10-CM | POA: Diagnosis present

## 2011-09-26 DIAGNOSIS — E119 Type 2 diabetes mellitus without complications: Secondary | ICD-10-CM | POA: Diagnosis present

## 2011-09-26 DIAGNOSIS — I2510 Atherosclerotic heart disease of native coronary artery without angina pectoris: Secondary | ICD-10-CM

## 2011-09-26 DIAGNOSIS — R269 Unspecified abnormalities of gait and mobility: Secondary | ICD-10-CM

## 2011-09-26 DIAGNOSIS — I214 Non-ST elevation (NSTEMI) myocardial infarction: Secondary | ICD-10-CM | POA: Diagnosis not present

## 2011-09-26 DIAGNOSIS — K5521 Angiodysplasia of colon with hemorrhage: Secondary | ICD-10-CM | POA: Diagnosis present

## 2011-09-26 DIAGNOSIS — G609 Hereditary and idiopathic neuropathy, unspecified: Secondary | ICD-10-CM | POA: Diagnosis present

## 2011-09-26 DIAGNOSIS — F068 Other specified mental disorders due to known physiological condition: Secondary | ICD-10-CM

## 2011-09-26 DIAGNOSIS — F3289 Other specified depressive episodes: Secondary | ICD-10-CM | POA: Diagnosis present

## 2011-09-26 DIAGNOSIS — B351 Tinea unguium: Secondary | ICD-10-CM

## 2011-09-26 DIAGNOSIS — Z860100 Personal history of colon polyps, unspecified: Secondary | ICD-10-CM

## 2011-09-26 DIAGNOSIS — Z7982 Long term (current) use of aspirin: Secondary | ICD-10-CM

## 2011-09-26 DIAGNOSIS — N2581 Secondary hyperparathyroidism of renal origin: Secondary | ICD-10-CM | POA: Diagnosis present

## 2011-09-26 DIAGNOSIS — Z9861 Coronary angioplasty status: Secondary | ICD-10-CM

## 2011-09-26 DIAGNOSIS — J984 Other disorders of lung: Secondary | ICD-10-CM

## 2011-09-26 DIAGNOSIS — F329 Major depressive disorder, single episode, unspecified: Secondary | ICD-10-CM

## 2011-09-26 DIAGNOSIS — M899 Disorder of bone, unspecified: Secondary | ICD-10-CM | POA: Diagnosis present

## 2011-09-26 DIAGNOSIS — F0390 Unspecified dementia without behavioral disturbance: Secondary | ICD-10-CM | POA: Diagnosis present

## 2011-09-26 DIAGNOSIS — R5381 Other malaise: Secondary | ICD-10-CM

## 2011-09-26 DIAGNOSIS — N186 End stage renal disease: Secondary | ICD-10-CM | POA: Diagnosis present

## 2011-09-26 DIAGNOSIS — Z7902 Long term (current) use of antithrombotics/antiplatelets: Secondary | ICD-10-CM

## 2011-09-26 DIAGNOSIS — D62 Acute posthemorrhagic anemia: Principal | ICD-10-CM | POA: Diagnosis present

## 2011-09-26 DIAGNOSIS — M949 Disorder of cartilage, unspecified: Secondary | ICD-10-CM | POA: Diagnosis present

## 2011-09-26 DIAGNOSIS — I255 Ischemic cardiomyopathy: Secondary | ICD-10-CM

## 2011-09-26 DIAGNOSIS — E042 Nontoxic multinodular goiter: Secondary | ICD-10-CM | POA: Diagnosis present

## 2011-09-26 DIAGNOSIS — Z992 Dependence on renal dialysis: Secondary | ICD-10-CM

## 2011-09-26 DIAGNOSIS — R509 Fever, unspecified: Secondary | ICD-10-CM | POA: Diagnosis not present

## 2011-09-26 DIAGNOSIS — N62 Hypertrophy of breast: Secondary | ICD-10-CM

## 2011-09-26 DIAGNOSIS — Q2730 Arteriovenous malformation, site unspecified: Secondary | ICD-10-CM

## 2011-09-26 DIAGNOSIS — R0602 Shortness of breath: Secondary | ICD-10-CM

## 2011-09-26 DIAGNOSIS — E785 Hyperlipidemia, unspecified: Secondary | ICD-10-CM | POA: Diagnosis present

## 2011-09-26 DIAGNOSIS — M542 Cervicalgia: Secondary | ICD-10-CM

## 2011-09-26 DIAGNOSIS — J189 Pneumonia, unspecified organism: Secondary | ICD-10-CM | POA: Diagnosis present

## 2011-09-26 DIAGNOSIS — D649 Anemia, unspecified: Secondary | ICD-10-CM

## 2011-09-26 DIAGNOSIS — I120 Hypertensive chronic kidney disease with stage 5 chronic kidney disease or end stage renal disease: Secondary | ICD-10-CM | POA: Diagnosis present

## 2011-09-26 DIAGNOSIS — I509 Heart failure, unspecified: Secondary | ICD-10-CM | POA: Diagnosis present

## 2011-09-26 DIAGNOSIS — R5383 Other fatigue: Secondary | ICD-10-CM | POA: Diagnosis present

## 2011-09-26 DIAGNOSIS — I2589 Other forms of chronic ischemic heart disease: Secondary | ICD-10-CM | POA: Diagnosis present

## 2011-09-26 DIAGNOSIS — D638 Anemia in other chronic diseases classified elsewhere: Secondary | ICD-10-CM | POA: Diagnosis present

## 2011-09-26 DIAGNOSIS — I10 Essential (primary) hypertension: Secondary | ICD-10-CM

## 2011-09-26 DIAGNOSIS — I959 Hypotension, unspecified: Secondary | ICD-10-CM | POA: Insufficient documentation

## 2011-09-26 DIAGNOSIS — R42 Dizziness and giddiness: Secondary | ICD-10-CM

## 2011-09-26 DIAGNOSIS — Z0000 Encounter for general adult medical examination without abnormal findings: Secondary | ICD-10-CM

## 2011-09-26 DIAGNOSIS — E0590 Thyrotoxicosis, unspecified without thyrotoxic crisis or storm: Secondary | ICD-10-CM

## 2011-09-26 DIAGNOSIS — M109 Gout, unspecified: Secondary | ICD-10-CM | POA: Diagnosis present

## 2011-09-26 LAB — BASIC METABOLIC PANEL
CO2: 26 mEq/L (ref 19–32)
Calcium: 9.9 mg/dL (ref 8.4–10.5)
Creatinine, Ser: 14.71 mg/dL — ABNORMAL HIGH (ref 0.50–1.35)
GFR calc Af Amer: 4 mL/min — ABNORMAL LOW (ref >90)
Sodium: 144 mEq/L (ref 135–145)

## 2011-09-26 LAB — CBC
MCV: 97.6 fL (ref 78.0–100.0)
Platelets: 311 10*3/uL (ref 150–400)
RBC: 1.25 MIL/uL — ABNORMAL LOW (ref 4.22–5.81)
RDW: 19.2 % — ABNORMAL HIGH (ref 11.5–15.5)
WBC: 9.9 10*3/uL (ref 4.0–10.5)

## 2011-09-26 LAB — GLUCOSE, CAPILLARY
Glucose-Capillary: 138 mg/dL — ABNORMAL HIGH (ref 70–99)
Glucose-Capillary: 147 mg/dL — ABNORMAL HIGH (ref 70–99)

## 2011-09-26 LAB — MRSA PCR SCREENING: MRSA by PCR: NEGATIVE

## 2011-09-26 LAB — PREPARE RBC (CROSSMATCH)

## 2011-09-26 LAB — PROTIME-INR
INR: 0.92 (ref 0.00–1.49)
Prothrombin Time: 12.6 seconds (ref 11.6–15.2)

## 2011-09-26 MED ORDER — AMITRIPTYLINE HCL 50 MG PO TABS
50.0000 mg | ORAL_TABLET | Freq: Every day | ORAL | Status: DC | PRN
  Filled 2011-09-26: qty 1

## 2011-09-26 MED ORDER — FENTANYL CITRATE 0.05 MG/ML IJ SOLN
INTRAMUSCULAR | Status: DC | PRN
  Administered 2011-09-26: 50 ug via INTRAVENOUS

## 2011-09-26 MED ORDER — FAMOTIDINE 20 MG PO TABS
20.0000 mg | ORAL_TABLET | Freq: Two times a day (BID) | ORAL | Status: DC
  Administered 2011-09-27 (×2): 20 mg via ORAL
  Filled 2011-09-26 (×6): qty 1

## 2011-09-26 MED ORDER — SODIUM CHLORIDE 0.9 % IV SOLN: INTRAVENOUS | Status: AC

## 2011-09-26 MED ORDER — FENTANYL CITRATE 0.05 MG/ML IJ SOLN
INTRAMUSCULAR | Status: AC
  Filled 2011-09-26: qty 2

## 2011-09-26 MED ORDER — LIDOCAINE-PRILOCAINE 2.5-2.5 % EX CREA: 1.0000 "application " | TOPICAL_CREAM | CUTANEOUS | Status: DC | PRN

## 2011-09-26 MED ORDER — GABAPENTIN 300 MG PO CAPS
300.0000 mg | ORAL_CAPSULE | Freq: Every evening | ORAL | Status: DC | PRN
Start: 2011-09-26 — End: 2011-10-01
  Administered 2011-09-26 – 2011-09-30 (×4): 300 mg via ORAL
  Filled 2011-09-26 (×4): qty 1

## 2011-09-26 MED ORDER — HEPARIN SODIUM (PORCINE) 1000 UNIT/ML IJ SOLN
INTRAMUSCULAR | Status: AC
  Filled 2011-09-26: qty 1

## 2011-09-26 MED ORDER — CALCIUM CARBONATE-VITAMIN D 500-200 MG-UNIT PO TABS
1.0000 | ORAL_TABLET | Freq: Every day | ORAL | Status: DC
  Administered 2011-09-27: 1 via ORAL
  Filled 2011-09-26 (×4): qty 1

## 2011-09-26 MED ORDER — LIDOCAINE HCL (PF) 1 % IJ SOLN: 5.0000 mL | INTRAMUSCULAR | Status: DC | PRN

## 2011-09-26 MED ORDER — SODIUM CHLORIDE 0.9 % IV SOLN: 100.0000 mL | INTRAVENOUS | Status: DC | PRN

## 2011-09-26 MED ORDER — MIDAZOLAM HCL 2 MG/2ML IJ SOLN
INTRAMUSCULAR | Status: AC
  Filled 2011-09-26: qty 2

## 2011-09-26 MED ORDER — INSULIN ASPART 100 UNIT/ML ~~LOC~~ SOLN
0.0000 [IU] | Freq: Three times a day (TID) | SUBCUTANEOUS | Status: DC
  Administered 2011-09-27: 2 [IU] via SUBCUTANEOUS
  Filled 2011-09-26: qty 3

## 2011-09-26 MED ORDER — ALLOPURINOL 100 MG PO TABS
100.0000 mg | ORAL_TABLET | Freq: Every day | ORAL | Status: DC
  Administered 2011-09-27 – 2011-10-01 (×4): 100 mg via ORAL
  Filled 2011-09-26 (×6): qty 1

## 2011-09-26 MED ORDER — PENTAFLUOROPROP-TETRAFLUOROETH EX AERO: 1.0000 "application " | INHALATION_SPRAY | CUTANEOUS | Status: DC | PRN

## 2011-09-26 MED ORDER — NEPRO/CARBSTEADY PO LIQD
237.0000 mL | ORAL | Status: DC | PRN
  Filled 2011-09-26: qty 237

## 2011-09-26 MED ORDER — ALTEPLASE 2 MG IJ SOLR
2.0000 mg | Freq: Once | INTRAMUSCULAR | Status: AC | PRN
  Filled 2011-09-26: qty 2

## 2011-09-26 MED ORDER — MIDAZOLAM HCL 5 MG/5ML IJ SOLN
INTRAMUSCULAR | Status: DC | PRN
  Administered 2011-09-26: 1 mg via INTRAVENOUS

## 2011-09-26 MED ORDER — OMEGA-3-ACID ETHYL ESTERS 1 G PO CAPS
1.0000 g | ORAL_CAPSULE | Freq: Every day | ORAL | Status: DC
  Administered 2011-09-27 – 2011-10-01 (×4): 1 g via ORAL
  Filled 2011-09-26 (×6): qty 1

## 2011-09-26 MED ORDER — HYDROCODONE-ACETAMINOPHEN 10-325 MG PO TABS
2.0000 | ORAL_TABLET | Freq: Four times a day (QID) | ORAL | Status: DC | PRN
  Administered 2011-09-26 – 2011-09-27 (×2): 1 via ORAL
  Administered 2011-09-29 – 2011-09-30 (×2): 2 via ORAL
  Filled 2011-09-26 (×3): qty 2
  Filled 2011-09-26: qty 1

## 2011-09-26 MED ORDER — RENA-VITE PO TABS
1.0000 | ORAL_TABLET | Freq: Every day | ORAL | Status: DC
  Administered 2011-09-27 – 2011-10-01 (×4): 1 via ORAL
  Filled 2011-09-26 (×6): qty 1

## 2011-09-26 MED ORDER — HEPARIN SODIUM (PORCINE) 1000 UNIT/ML DIALYSIS
1000.0000 [IU] | INTRAMUSCULAR | Status: DC | PRN
  Filled 2011-09-26: qty 1

## 2011-09-26 MED ORDER — CARVEDILOL 3.125 MG PO TABS
3.1250 mg | ORAL_TABLET | Freq: Two times a day (BID) | ORAL | Status: DC
  Administered 2011-09-27 – 2011-09-28 (×3): 3.125 mg via ORAL
  Filled 2011-09-26 (×6): qty 1

## 2011-09-26 MED ORDER — VANCOMYCIN HCL IN DEXTROSE 1-5 GM/200ML-% IV SOLN
1000.0000 mg | Freq: Once | INTRAVENOUS | Status: AC
  Administered 2011-09-26: 1000 mg via INTRAVENOUS
  Filled 2011-09-26: qty 200

## 2011-09-26 MED ORDER — CINACALCET HCL 30 MG PO TABS
60.0000 mg | ORAL_TABLET | Freq: Two times a day (BID) | ORAL | Status: DC
  Administered 2011-09-27 – 2011-10-01 (×8): 60 mg via ORAL
  Filled 2011-09-26 (×13): qty 2

## 2011-09-26 MED ORDER — SODIUM CHLORIDE 0.9 % IV SOLN: INTRAVENOUS | Status: DC

## 2011-09-26 NOTE — H&P (Signed)
PCP:  [name], MD, MD   DOA:  09/26/2011  1:40 PM  Chief Complaint:  Admitted for  significant drop in H&h  HPI: 60 year old male with end- stage renal disease on HD ( M,W,F) , hx of malena and low H&h with complete GI w/up done in august this year , CAD with cardiac cath DES in 08 and chronically on ASA and plavix and multiple co morbidities listed below was sent from IR where he was having a dialysis catheter placed as his AV graft clotted 2 days back and was found to have hb of 3.7. Patient informs that he noticed having melanotic stool 4 weeks back on 2 occasions and again for past 4 days has been having dark melanotic stool and feeling very fatigued for past 1 week. He denies noticing gross  blood in stool , hematemesis, nausea or vomiting . denies chest pain, palpitation, dizziness, headache or diaphoresis. He was admitted here in august for malena and has w/up done including EGD, colonosopy and capsule enteroscopy with normal results except for few polyps and considered to have possible AVMs. Denies being on NSAIDs but is chronically on ASA and plavix. Patient admitted to stepdown and getting PRBCs transfused.  Allergies: Allergies  Allergen Reactions  . Cephalexin Other (See Comments)    Tongue swelling  . Statins     REACTION: perceived myalgias    Prior to Admission medications   Medication Sig Start Date End Date Taking? Authorizing Provider  allopurinol (ZYLOPRIM) 100 MG tablet Take 100 mg by mouth daily.     Yes Historical Provider, MD  aspirin 81 MG EC tablet Take 81 mg by mouth daily.     Yes Historical Provider, MD  calcium-vitamin D (OSCAL WITH D) 500-200 MG-UNIT per tablet 2 by mouth three times per week    Yes Historical Provider, MD  carvedilol (COREG) 12.5 MG tablet TAKE 1 TABLET TWICE DAILY 06/28/11  Yes [name], MD  cinacalcet (SENSIPAR) 60 MG tablet Take 60 mg by mouth 2 (two) times daily.     Yes Historical Provider, MD  clopidogrel (PLAVIX) 75 MG tablet Take  75 mg by mouth daily.     Yes Historical Provider, MD  epoetin alfa (EPOGEN,PROCRIT) 3000 UNIT/ML injection Inject 3,000 Units into the skin 3 (three) times a week. With dialysis.    Yes Historical Provider, MD  gabapentin (NEURONTIN) 300 MG capsule Take 300 mg by mouth at bedtime as needed.     Yes Historical Provider, MD  multivitamin (RENA-VIT) TABS tablet Take 1 tablet by mouth daily.     Yes Historical Provider, MD  Omega-3 Fatty Acids (FISH OIL) 1000 MG CAPS Take by mouth 3 (three) times daily.     Yes Historical Provider, MD  ranitidine (ZANTAC) 300 MG capsule Take 300 mg by mouth daily.     Yes Historical Provider, MD  [facility] 3.75 G PACK MIX 1 PACKET IN WATER AND DRINK ONE TIME DAILY 09/07/11  Yes [name], MD  amitriptyline (ELAVIL) 50 MG tablet Take 50 mg by mouth daily as needed. As needed when feeling depressed. 06/14/11   Historical Provider, MD    Past Medical History  Diagnosis Date  . Hyperthyroidism   . GASTROENTERITIS, VIRAL 10/14/2009  . ONYCHOMYCOSIS, TOENAILS 12/26/2007  . GOITER, MULTINODULAR 12/26/2007  . HYPERTHYROIDISM 02/02/2010  . DIABETES MELLITUS, TYPE II 02/01/2010  . DYSLIPIDEMIA 06/18/2007  . GOUT 06/18/2007  . Hypocalcemia 06/07/2010  . DEMENTIA 09/02/2009  . DEPRESSION 10/14/2009  . PERIPHERAL NEUROPATHY  06/18/2007  . HYPERTENSION 06/18/2007  . CAD, NATIVE VESSEL 02/06/2009  . CONGESTIVE HEART FAILURE 06/18/2007  . DIASTOLIC HEART FAILURE, CHRONIC 02/06/2009  . Unspecified hypotension 01/30/2010  . PULMONARY NODULE, RIGHT LOWER LOBE 06/08/2009  . GERD 06/18/2007  . ESRD 08/04/2010  . BENIGN PROSTATIC HYPERTROPHY 10/14/2009  . GYNECOMASTIA 07/17/2010  . NECK PAIN 07/31/2010  . FOOT PAIN 08/12/2008  . DIZZINESS 07/17/2010  . Other malaise and fatigue 11/24/2009  . GAIT DISTURBANCE 03/03/2010  . DYSPNEA 10/29/2008  . CHEST PAIN 03/29/2010  . TRANSAMINASES, SERUM, ELEVATED 02/01/2010  . COLONIC POLYPS, HX OF 10/14/2009  . Anemia 06/16/2011  . Ischemic cardiomyopathy 06/16/2011    . Hyperparathyroidism, secondary 06/16/2011    Past Surgical History  Procedure Date  . Stent 06/11/08   . Neck sugury 2/09  . Back surgury 1998    Social History:  reports that he has quit smoking. He does not have any smokeless tobacco history on file. He reports that he does not use illicit drugs. His alcohol history not on file.  Family History  Problem Relation Age of Onset  . Heart disease Father   . Heart disease Sister     Review of Systems:  Constitutional: c/o fatigue , Denies fever, chills, diaphoresis, appetite change and .  HEENT: Denies photophobia, eye pain, redness, hearing loss, ear pain, congestion, sore throat, rhinorrhea, sneezing, mouth sores, trouble swallowing, neck pain, neck stiffness and tinnitus.   Respiratory: Denies SOB, DOE, cough, chest tightness,  and wheezing.   Cardiovascular: Denies chest pain, palpitations and leg swelling.  Gastrointestinal: c/o dark stool , Denies nausea, vomiting, abdominal pain, diarrhea, constipation and abdominal distention.  Genitourinary: Denies dysuria, urgency, frequency, hematuria, flank pain and difficulty urinating.  Musculoskeletal: Denies myalgias, back pain, joint swelling, arthralgias and gait problem.  Skin: Denies pallor, rash and wound.  Neurological: Denies dizziness, seizures, syncope, weakness, light-headedness, numbness and headaches.  Hematological: Denies adenopathy. Easy bruising, personal or family bleeding history  Psychiatric/Behavioral: Denies suicidal ideation, mood changes, confusion, nervousness, sleep disturbance and agitation   Physical Exam:  Filed Vitals:   09/26/11 1650 09/26/11 1745 09/26/11 1815 09/26/11 1830  BP: 159/82 160/139 160/139 176/94  Pulse: 89     Temp: 97.7 F (36.5 C) 97.5 F (36.4 C) 97.5 F (36.4 C) 97 F (36.1 C)  TempSrc: Oral Oral Oral Axillary  Resp: 23     Height: 6' (1.829 m)     Weight: 120.8 kg (266 lb 5.1 oz)     SpO2: 98%       Constitutional: Vital  signs reviewed.  Patient is a well-developed and well-nourished in no acute distress and cooperative with exam.has a new HD catheter over rt chest with clean dressing HEENT: pallor + , no icterus, moist mucosa Neck: Supple, Trachea midline normal ROM, No JVD, mass, thyromegaly, or carotid bruit present.  Cardiovascular: RRR, S1 normal, S2 normal, no MRG, pulses symmetric and intact bilaterally Pulmonary/Chest:  CTAB, no wheezes, rales, or rhonchi Abdominal: Soft. Non-tender, non-distended, bowel sounds are normal, no masses, organomegaly, or guarding present.  GU: no CVA tenderness Musculoskeletal: No joint deformities, erythema, or stiffness, ROM full and no nontender Ext: no edema and no cyanosis, pulses palpable bilaterally (DP and PT) Hematology: no cervical, inginal, or axillary adenopathy.  Neurological: A&O x3, Strenght is normal and symmetric bilaterally, cranial nerve II-XII are grossly intact, no focal motor deficit, sensory intact to light touch bilaterally.  Skin: Warm, dry and intact. No rash, cyanosis, or clubbing.  Psychiatric:  Normal mood and affect. speech and behavior is normal. Judgment and thought content normal. Cognition and memory are normal.   Labs on Admission:  Results for orders placed during the hospital encounter of 09/26/11 (from the past 48 hour(s))  TYPE AND SCREEN     Status: Normal (Preliminary result)   Collection Time   09/26/11  2:20 PM      Component Value Range Comment   ABO/RH(D) O POS      Antibody Screen NEG      Sample Expiration 09/29/2011      Unit Number [id number]      Blood Component Type RED CELLS,LR      Unit division 00      Status of Unit ISSUED      Transfusion Status OK TO TRANSFUSE      Crossmatch Result Compatible      Unit Number [id number]      Blood Component Type RED CELLS,LR      Unit division 00      Status of Unit ISSUED      Transfusion Status OK TO TRANSFUSE      Crossmatch Result Compatible      Unit Number [id number]       Blood Component Type RED CELLS,LR      Unit division 00      Status of Unit ALLOCATED      Transfusion Status OK TO TRANSFUSE      Crossmatch Result Compatible      Unit Number [id number]      Blood Component Type RED CELLS,LR      Unit division 00      Status of Unit ALLOCATED      Transfusion Status OK TO TRANSFUSE      Crossmatch Result Compatible      Unit Number [id number]      Blood Component Type RED CELLS,LR      Unit division 00      Status of Unit ALLOCATED      Transfusion Status OK TO TRANSFUSE      Crossmatch Result Compatible     PREPARE RBC (CROSSMATCH)     Status: Normal   Collection Time   09/26/11  2:20 PM      Component Value Range Comment   Order Confirmation ORDER PROCESSED BY BLOOD BANK     PREPARE RBC (CROSSMATCH)     Status: Normal   Collection Time   09/26/11  4:30 PM      Component Value Range Comment   Order Confirmation ORDER PROCESSED BY BLOOD BANK     MRSA PCR SCREENING     Status: Normal   Collection Time   09/26/11  5:06 PM      Component Value Range Comment   MRSA by PCR NEGATIVE  NEGATIVE    GLUCOSE, CAPILLARY     Status: Abnormal   Collection Time   09/26/11  6:27 PM      Component Value Range Comment   Glucose-Capillary 138 (*) 70 - 99 (mg/dL)    Comment 1 Notify RN       Radiological Exams on Admission:   Assessment/Plan 60 y/o AA male with multiple co morbidities including HTN, CAD s/p cath with Stenting in 08 and chronically on ASA and plavix, ESRD on HD, DM who was admitted with aug this yr for melena and drop in H&h and had EGD/ Colonoscopy and capsule enteroscopy without signifciant findings and considered to be possible AVMs was sent from IR for  low H&h.  Plan:  severe anemia  patient's  hb of 3.7, was about 12 few months back. It was noted to be close to 8 last week. He has been c/o having melanotic stool for past few weeks and more frequent since 4 days with 1 week of feeling fatigued.  Patient admitted to stepdown   vital stable and asymptomatic otherwise transfusing with 5 U prbc. Check serial H&h Check FOBT  Will also get a  CT abd and pelvis to check for any retroperitoneal/ pelvic hematoma [facility] ( Dr Watt Climes) called and pt will be seen in am. He ahs had w/up in august already Will hold ASA and plavix. Need to d/w his cardiologist on need to continue them . His cardiologist per chart in Dr Montez Morita / harwani  3. History of end-stage renal disease on HD Patient had a clotted left AV graft 2 days back and had a HD catheter placed by IR today. Renal team aware and he will get HD this evening  4. hypertension. Cont home BP meds   5. History of coronary artery disease status post Taxus drug-eluting  stent in 2008, to his right coronary artery.  Holding ASA and plavix  please d/w his cardiologist in am regarding continuing his ASA and plavix given recurrent melena and anemia  6. Hx of Diabetes mellitus  cont SSI   7. Hx of ischemic CM cont coreg, holding ASA Stable at present  DIET: Cardiac  Full code Time Spent on Admission: 45 minutes  Taylormarie Register 09/26/2011, 6:52 PM

## 2011-09-26 NOTE — ED Notes (Signed)
EKG completed and given to Dr. Zenia Resides along with OLD EKG.

## 2011-09-26 NOTE — ED Notes (Signed)
[id number] READY

## 2011-09-26 NOTE — ED Notes (Signed)
Pt here from IR, had HD cath placed today and had blood drawn, shown hgb of 3 per cbc results. Last dilayized on Saturday. sts unable to have it on Monday due to clot in graft. In IR received medication, vanc, fentanyl and versed, also had cbc, bmet, pt ptt inr bun and creat drawn, K was 5.7

## 2011-09-26 NOTE — ED Notes (Signed)
ekg given to dr. Zenia Resides.

## 2011-09-26 NOTE — ED Provider Notes (Addendum)
History    Patient presents with low hemoglobin. Was at outpatient radiology having a vascular access catheter placed for hemodialysis. Routine blood work there showed a hemoglobin of 3.3. Patient does have a history of black stools 2 days ago which has since resolved. Notes he has been feeling increasingly weak over the last 5 days. Denies any hematemesis. No abdominal pain. No bright red blood per rectum. No medications used prior to arrival. Notes he does have diffuse whole-body weakness. No history of syncope recently CSN: [id number]  Arrival date & time 09/26/11  1340   First MD Initiated Contact with Patient 09/26/11 1350      Chief Complaint  Patient presents with  . Fatigue    (Consider location/radiation/quality/duration/timing/severity/associated sxs/prior treatment) The history is provided by the patient.    Past Medical History  Diagnosis Date  . Hyperthyroidism   . GASTROENTERITIS, VIRAL 10/14/2009  . ONYCHOMYCOSIS, TOENAILS 12/26/2007  . GOITER, MULTINODULAR 12/26/2007  . HYPERTHYROIDISM 02/02/2010  . DIABETES MELLITUS, TYPE II 02/01/2010  . DYSLIPIDEMIA 06/18/2007  . GOUT 06/18/2007  . Hypocalcemia 06/07/2010  . DEMENTIA 09/02/2009  . DEPRESSION 10/14/2009  . PERIPHERAL NEUROPATHY 06/18/2007  . HYPERTENSION 06/18/2007  . CAD, NATIVE VESSEL 02/06/2009  . CONGESTIVE HEART FAILURE 06/18/2007  . DIASTOLIC HEART FAILURE, CHRONIC 02/06/2009  . Unspecified hypotension 01/30/2010  . PULMONARY NODULE, RIGHT LOWER LOBE 06/08/2009  . GERD 06/18/2007  . ESRD 08/04/2010  . BENIGN PROSTATIC HYPERTROPHY 10/14/2009  . GYNECOMASTIA 07/17/2010  . NECK PAIN 07/31/2010  . FOOT PAIN 08/12/2008  . DIZZINESS 07/17/2010  . Other malaise and fatigue 11/24/2009  . GAIT DISTURBANCE 03/03/2010  . DYSPNEA 10/29/2008  . CHEST PAIN 03/29/2010  . TRANSAMINASES, SERUM, ELEVATED 02/01/2010  . COLONIC POLYPS, HX OF 10/14/2009  . Anemia 06/16/2011  . Ischemic cardiomyopathy 06/16/2011  . Hyperparathyroidism, secondary  06/16/2011    Past Surgical History  Procedure Date  . Stent 06/11/08   . Neck sugury 2/09  . Back surgury 1998    Family History  Problem Relation Age of Onset  . Heart disease Father   . Heart disease Sister     History  Substance Use Topics  . Smoking status: Former Research scientist (life sciences)  . Smokeless tobacco: Not on file   Comment: Quit smoking 2007 Smoked x 25 years 1/2 ppd.  . Alcohol Use: Not on file      Review of Systems  All other systems reviewed and are negative.    Allergies  Cephalexin and Statins  Home Medications   Current Outpatient Rx  Name Route Sig Dispense Refill  . ALLOPURINOL 100 MG PO TABS Oral Take 100 mg by mouth daily.      [name] AMITRIPTYLINE HCL 50 MG PO TABS      . ASPIRIN 81 MG PO TBEC Oral Take 81 mg by mouth daily.      . ATORVASTATIN CALCIUM 10 MG PO TABS  1 by mouth every other day     . CALCIUM CARBONATE-VITAMIN D 500-200 MG-UNIT PO TABS  2 by mouth three times per week     . CARVEDILOL 12.5 MG PO TABS  TAKE 1 TABLET TWICE DAILY 180 tablet 1  . CINACALCET HCL 60 MG PO TABS Oral Take 60 mg by mouth 2 (two) times daily.      [name] CITALOPRAM HYDROBROMIDE 10 MG PO TABS      . CLOPIDOGREL BISULFATE 75 MG PO TABS Oral Take 75 mg by mouth daily.      . CYCLOBENZAPRINE HCL  10 MG PO TABS  1 tab x a day as needed for pain 90 tablet 11  . EPOETIN ALFA 3000 UNIT/ML IJ SOLN Subcutaneous Inject 3,000 Units into the skin 3 (three) times a week. At dialysis     . EZETIMIBE 10 MG PO TABS Oral Take 10 mg by mouth daily.      [name] GABAPENTIN 300 MG PO CAPS Oral Take 300 mg by mouth at bedtime as needed.      . METHIMAZOLE 5 MG PO TABS  1 tablet 2 times a week     . RENA-VITE PO TABS Oral Take 1 tablet by mouth daily.      [name] FISH OIL 1000 MG PO CAPS Oral Take by mouth 3 (three) times daily.      [name] RANITIDINE HCL 300 MG PO CAPS Oral Take 300 mg by mouth daily.      [name] TIZANIDINE HCL 4 MG PO TABS Oral Take 1 tablet (4 mg total) by mouth 3 (three) times daily. 90 tablet 1  .  WELCHOL 3.75 G PO PACK  MIX 1 PACKET IN WATER AND DRINK ONE TIME DAILY 90 each 3    BP 175/97  Pulse 100  Temp(Src) 98 F (36.7 C) (Oral)  Resp 16  SpO2 96%  Physical Exam  Nursing note and vitals reviewed. Constitutional: He is oriented to person, place, and time. He appears well-developed and well-nourished.  Non-toxic appearance. No distress.  HENT:  Head: Normocephalic and atraumatic.  Eyes: EOM and lids are normal. Pupils are equal, round, and reactive to light.    Neck: Normal range of motion. Neck supple. No tracheal deviation present. No mass present.  Cardiovascular: Normal rate, regular rhythm and normal heart sounds.  Exam reveals no gallop.   No murmur heard. Pulmonary/Chest: Effort normal and breath sounds normal. No stridor. No respiratory distress. He has no decreased breath sounds. He has no wheezes. He has no rhonchi. He has no rales.  Abdominal: Soft. Normal appearance and bowel sounds are normal. He exhibits no distension. There is no tenderness. There is no rebound and no CVA tenderness.  Musculoskeletal: Normal range of motion. He exhibits no edema and no tenderness.  Neurological: He is alert and oriented to person, place, and time. He has normal strength. No cranial nerve deficit or sensory deficit. GCS eye subscore is 4. GCS verbal subscore is 5. GCS motor subscore is 6.  Skin: Skin is warm and dry. No abrasion and no rash noted. There is pallor.  Psychiatric: He has a normal mood and affect. His speech is normal and behavior is normal.    ED Course  Procedures (including critical care time)   Labs Reviewed  TYPE AND SCREEN  PREPARE RBC (CROSSMATCH)   No results found.   No diagnosis found.    MDM  Spoke with [facility]ad hospitalist, patient will be admitted. Patient has blood transfusion ordered here   Date: 09/26/2011  Rate: 78  Rhythm: normal sinus rhythm  QRS Axis: normal  Intervals: normal  ST/T Wave abnormalities: nonspecific T wave  changes  Conduction Disutrbances:none  Narrative Interpretation:   Old EKG Reviewed: changes noted        Leota Jacobsen, MD 09/26/11 1422  Leota Jacobsen, MD 09/26/11 1430

## 2011-09-26 NOTE — Telephone Encounter (Signed)
I'm not sure if you should get this message? [name] Huebel wife [name] called and Dr. Bridgett Larsson did a clean out on his access but it did not work. [name]. [name] is in the hospital and will need a cath. She wants to make sure that Dr. Bridgett Larsson is notified of this and if she needs to make a f/u appointment with him? Thanks Malachy Mood      Talked with Ramiro Harvest, renal PA and he said [name] was having a diatek catheter placed in IR today,. He was then going to have [facility] set up an appt with Dr Bridgett Larsson. I talked with Mrs Hutchings and restated this.

## 2011-09-26 NOTE — ED Notes (Signed)
Nephrology at bedside consulting with pt. Pt allowed to eat and pt given meal. Pt to receive blood during diaylsis, pt madeaware

## 2011-09-26 NOTE — H&P (Signed)
[name] is an 60 y.o. male.   Chief Complaint: ESRD; clotted Rt arm graft (just revised by surgery 1 week ago)  HPI: scheduled now for Hemodialysis Catheter placement. Pt on Plavix and has been off x 3 days; CAD  Past Medical History  Diagnosis Date  . Hyperthyroidism   . GASTROENTERITIS, VIRAL 10/14/2009  . ONYCHOMYCOSIS, TOENAILS 12/26/2007  . GOITER, MULTINODULAR 12/26/2007  . HYPERTHYROIDISM 02/02/2010  . DIABETES MELLITUS, TYPE II 02/01/2010  . DYSLIPIDEMIA 06/18/2007  . GOUT 06/18/2007  . Hypocalcemia 06/07/2010  . DEMENTIA 09/02/2009  . DEPRESSION 10/14/2009  . PERIPHERAL NEUROPATHY 06/18/2007  . HYPERTENSION 06/18/2007  . CAD, NATIVE VESSEL 02/06/2009  . CONGESTIVE HEART FAILURE 06/18/2007  . DIASTOLIC HEART FAILURE, CHRONIC 02/06/2009  . Unspecified hypotension 01/30/2010  . PULMONARY NODULE, RIGHT LOWER LOBE 06/08/2009  . GERD 06/18/2007  . ESRD 08/04/2010  . BENIGN PROSTATIC HYPERTROPHY 10/14/2009  . GYNECOMASTIA 07/17/2010  . NECK PAIN 07/31/2010  . FOOT PAIN 08/12/2008  . DIZZINESS 07/17/2010  . Other malaise and fatigue 11/24/2009  . GAIT DISTURBANCE 03/03/2010  . DYSPNEA 10/29/2008  . CHEST PAIN 03/29/2010  . TRANSAMINASES, SERUM, ELEVATED 02/01/2010  . COLONIC POLYPS, HX OF 10/14/2009  . Anemia 06/16/2011  . Ischemic cardiomyopathy 06/16/2011  . Hyperparathyroidism, secondary 06/16/2011    Past Surgical History  Procedure Date  . Stent 06/11/08   . Neck sugury 2/09  . Back surgury 1998    Family History  Problem Relation Age of Onset  . Heart disease Father   . Heart disease Sister    Social History:  reports that he has quit smoking. He does not have any smokeless tobacco history on file. He reports that he does not use illicit drugs. His alcohol history not on file.  Allergies:  Allergies  Allergen Reactions  . Cephalexin   . Statins     REACTION: perceived myalgias    Medications Prior to Admission  Medication Sig Dispense Refill  . allopurinol (ZYLOPRIM) 100 MG  tablet Take 100 mg by mouth daily.        [name] amitriptyline (ELAVIL) 50 MG tablet       . aspirin 81 MG EC tablet Take 81 mg by mouth daily.        [name] atorvastatin (LIPITOR) 10 MG tablet 1 by mouth every other day       . calcium-vitamin D (OSCAL WITH D) 500-200 MG-UNIT per tablet 2 by mouth three times per week       . carvedilol (COREG) 12.5 MG tablet TAKE 1 TABLET TWICE DAILY  180 tablet  1  . cinacalcet (SENSIPAR) 60 MG tablet Take 60 mg by mouth 2 (two) times daily.        . citalopram (CELEXA) 10 MG tablet       . clopidogrel (PLAVIX) 75 MG tablet Take 75 mg by mouth daily.        . cyclobenzaprine (FLEXERIL) 10 MG tablet 1 tab x a day as needed for pain  90 tablet  11  . epoetin alfa (EPOGEN) 3000 UNIT/ML injection Inject 3,000 Units into the skin 3 (three) times a week. At dialysis       . ezetimibe (ZETIA) 10 MG tablet Take 10 mg by mouth daily.        [name] gabapentin (NEURONTIN) 300 MG capsule Take 300 mg by mouth at bedtime as needed.        . methimazole (TAPAZOLE) 5 MG tablet 1 tablet 2 times a week       .  multivitamin (RENA-VIT) TABS tablet Take 1 tablet by mouth daily.        . Omega-3 Fatty Acids (FISH OIL) 1000 MG CAPS Take by mouth 3 (three) times daily.        . ranitidine (ZANTAC) 300 MG capsule Take 300 mg by mouth daily.        [name] tiZANidine (ZANAFLEX) 4 MG tablet Take 1 tablet (4 mg total) by mouth 3 (three) times daily.  90 tablet  1  . WELCHOL 3.75 G PACK MIX 1 PACKET IN WATER AND DRINK ONE TIME DAILY  90 each  3   Medications Prior to Admission  Medication Dose Route Frequency Provider Last Rate Last Dose  . 0.9 %  sodium chloride infusion   Intravenous Continuous Lavonia Drafts, PA      . vancomycin (VANCOCIN) IVPB 1000 mg/200 mL premix  1,000 mg Intravenous Once Lavonia Drafts, PA        No results found for this or any previous visit (from the past 48 hour(s)). No results found.  Review of Systems  Constitutional: Negative for fever.  Respiratory: Negative for  cough.   Cardiovascular: Negative for chest pain.  Neurological: Negative for headaches.    There were no vitals taken for this visit. Physical Exam  Constitutional: He is oriented to person, place, and time. He appears well-developed and well-nourished.  HENT:  Head: Normocephalic.  Eyes: EOM are normal.  Neck: Normal range of motion.  Cardiovascular: Normal rate, regular rhythm and normal heart sounds.   No murmur heard. Respiratory: Effort normal and breath sounds normal. He has no wheezes.  GI: Soft. Bowel sounds are normal. There is no tenderness.  Musculoskeletal: Normal range of motion.       slow  Neurological: He is alert and oriented to person, place, and time.  Skin: Skin is warm and dry.     Assessment/Plan Right arm graft clotted after recent surgical revision. Scheduled for Hemodialysis catheter placement today. Pt aware of procedure benefits and risks and agreeable to proceed. Off Plavix x 3 days; aware of bleeding risks.  [name] A 09/26/2011, 11:49 AM

## 2011-09-26 NOTE — ED Notes (Signed)
Pt signed consent for blood transfusion. Witnessed by [employer]

## 2011-09-26 NOTE — Telephone Encounter (Signed)
Message copied by Wyatt Haste on Wed Sep 26, 2011 11:12 AM ------      Message from: Janann August      Created: Wed Sep 26, 2011 10:47 AM      Regarding: Call wife Kaiyu Osen      Contact: [phone]       I'm not sure if you should get this message?  Mr Bakeman wife [name] called and Dr. Bridgett Larsson did a clean out on his access but it did not work.  Mr. Mesmer is in the hospital and will need a cath.  She wants to make sure that Dr. Bridgett Larsson is notified of this and if she needs to make a f/u appointment with him?  Thanks [employer]

## 2011-09-26 NOTE — Consults (Signed)
[facility] Renal Consultation Note  Indication for Consultation:  Management of ESRD/hemodialysis; anemia, hypertension/volume and secondary hyperparathyroidism  HPI: [name] is a 60 y.o. male with ESRD on HD at [facility] on TTS who received a right IJ catheter as an outpatient by Dr. Vernard Gambles in IR this morning and was found to have a Hgb of 3.7.  He reports intermittent episodes of dark stools for several months, most recently 3-4 days ago, and was evaluated by Dr. Paulita Fujita with colonoscopy and endoscopy in August.  He acknowledges weakness and fatigue for several days, but believed it to be secondary to missed dialysis on 12/24 due to a clotted access, a RUA AVG.  He denies any dizziness or constipation.  His history includes colonic polyps, gastritis, and GERD.    Dialysis Orders: Center: [facility] on TTS EDW  112 kg   Heparin 3000 U    Access Right IJ catheter  BFR 450  DFR 800 Zemplar 19mcg IV/HD   Epogen 4000 U  IV/HD  Venofer  0  Past Medical History  Diagnosis Date  . Hyperthyroidism   . GASTROENTERITIS, VIRAL 10/14/2009  . ONYCHOMYCOSIS, TOENAILS 12/26/2007  . GOITER, MULTINODULAR 12/26/2007  . HYPERTHYROIDISM 02/02/2010  . DIABETES MELLITUS, TYPE II 02/01/2010  . DYSLIPIDEMIA 06/18/2007  . GOUT 06/18/2007  . Hypocalcemia 06/07/2010  . DEMENTIA 09/02/2009  . DEPRESSION 10/14/2009  . PERIPHERAL NEUROPATHY 06/18/2007  . HYPERTENSION 06/18/2007  . CAD, NATIVE VESSEL 02/06/2009  . CONGESTIVE HEART FAILURE 06/18/2007  . DIASTOLIC HEART FAILURE, CHRONIC 02/06/2009  . Unspecified hypotension 01/30/2010  . PULMONARY NODULE, RIGHT LOWER LOBE 06/08/2009  . GERD 06/18/2007  . ESRD 08/04/2010  . BENIGN PROSTATIC HYPERTROPHY 10/14/2009  . GYNECOMASTIA 07/17/2010  . NECK PAIN 07/31/2010  . FOOT PAIN 08/12/2008  . DIZZINESS 07/17/2010  . Other malaise and fatigue 11/24/2009  . GAIT DISTURBANCE 03/03/2010  . DYSPNEA 10/29/2008  . CHEST PAIN 03/29/2010  . TRANSAMINASES, SERUM, ELEVATED 02/01/2010  .  COLONIC POLYPS, HX OF 10/14/2009  . Anemia 06/16/2011  . Ischemic cardiomyopathy 06/16/2011  . Hyperparathyroidism, secondary 06/16/2011   Past Surgical History  Procedure Date  . Stent 06/11/08   . Neck sugury 2/09  . Back surgury 1998   Family History  Problem Relation Age of Onset  . Heart disease Father   . Heart disease Sister     reports that he has quit smoking. He does not have any smokeless tobacco history on file. He reports that he does not use illicit drugs. His alcohol history not on file. Allergies  Allergen Reactions  . Cephalexin Other (See Comments)    Tongue swelling  . Statins     REACTION: perceived myalgias   Prior to Admission medications   Medication Sig Start Date End Date Taking? Authorizing Provider  allopurinol (ZYLOPRIM) 100 MG tablet Take 100 mg by mouth daily.     Yes Historical Provider, MD  aspirin 81 MG EC tablet Take 81 mg by mouth daily.     Yes Historical Provider, MD  calcium-vitamin D (OSCAL WITH D) 500-200 MG-UNIT per tablet 2 by mouth three times per week    Yes Historical Provider, MD  carvedilol (COREG) 12.5 MG tablet TAKE 1 TABLET TWICE DAILY 06/28/11  Yes [name], MD  cinacalcet (SENSIPAR) 60 MG tablet Take 60 mg by mouth 2 (two) times daily.     Yes Historical Provider, MD  clopidogrel (PLAVIX) 75 MG tablet Take 75 mg by mouth daily.     Yes  Historical Provider, MD  gabapentin (NEURONTIN) 300 MG capsule Take 300 mg by mouth at bedtime as needed.     Yes Historical Provider, MD  multivitamin (RENA-VIT) TABS tablet Take 1 tablet by mouth daily.     Yes Historical Provider, MD  Omega-3 Fatty Acids (FISH OIL) 1000 MG CAPS Take by mouth 3 (three) times daily.     Yes Historical Provider, MD  ranitidine (ZANTAC) 300 MG capsule Take 300 mg by mouth daily.     Yes Historical Provider, MD  [facility] 3.75 G PACK MIX 1 PACKET IN WATER AND DRINK ONE TIME DAILY 09/07/11  Yes [name], MD  amitriptyline (ELAVIL) 50 MG tablet Take 50 mg by mouth daily as  needed. As needed when feeling depressed. 06/14/11   Historical Provider, MD    I have reviewed the patient's current medications.  Labs:  Results for orders placed during the hospital encounter of 09/26/11 (from the past 48 hour(s))  TYPE AND SCREEN     Status: Normal (Preliminary result)   Collection Time   09/26/11  2:20 PM      Component Value Range Comment   ABO/RH(D) O POS      Antibody Screen NEG      Sample Expiration 09/29/2011      Unit Number [id number]      Blood Component Type RED CELLS,LR      Unit division 00      Status of Unit ALLOCATED      Transfusion Status OK TO TRANSFUSE      Crossmatch Result Compatible      Unit Number [id number]      Blood Component Type RED CELLS,LR      Unit division 00      Status of Unit ALLOCATED      Transfusion Status OK TO TRANSFUSE      Crossmatch Result Compatible     PREPARE RBC (CROSSMATCH)     Status: Normal   Collection Time   09/26/11  2:20 PM      Component Value Range Comment   Order Confirmation ORDER PROCESSED BY BLOOD BANK      Constitutional: fatigue Eyes: negative Ears, nose, mouth, throat, and face: negative Respiratory: negative Cardiovascular: negative Gastrointestinal: negative Genitourinary:oliguric Musculoskeletal:negative Neurological: negative  Physical Exam: Filed Vitals:   09/26/11 1442  BP: 160/86  Pulse: 82  Temp:   Resp: 23     General appearance: alert, cooperative and no distress Head: Normocephalic, without obvious abnormality, atraumatic Throat: lips, mucosa, and tongue normal; teeth and gums normal Neck: no adenopathy, no carotid bruit, no JVD and supple, symmetrical, trachea midline Resp: clear to auscultation bilaterally Cardio: regular rate and rhythm, S1, S2 normal, no murmur, click, rub or gallop GI: soft, non-tender; bowel sounds normal; no masses,  no organomegaly Extremities: extremities normal, atraumatic, no cyanosis or edema Lymph nodes: Cervical, supraclavicular, and  axillary nodes normal. Dialysis Access: R IJ catheter.  Assessment/Plan: 1. Anemia - Hgb of 3.7 this AM, dropping for 3 weeks ( 8.1 on 12/20, 9.3 on 12/13, 9.8 on 12/6), history of colonic polyps, GERD, and gastritis; intermittent black stools evaluated in August.  Receiving transfusion in ED. 2.  ESRD - on HD on TTS, but missed last HD on 12/24, secondary to clotted access, last HD on Saturday 12/22, K elevated @ 5.7.  HD pending today. Had a shuntogram 09/21/11. 3.  HTN/ Volume - BP slightly high, on Carvedilol, did not take med today.  4.  Metabolic bone disease -  On Calcium Carbonate with meals and Sensipar, off Zemplar. 5.  Nutrition - Alb stable, last 4.3.  LYLES,CHARLES 09/26/2011, 3:09 PM   Attending Nephrologist: Meridee Score  I have seen and examined this patient and agree with the assessment/plan as outlined above by Ramiro Harvest PA. Patient here for HD catheter placement after failed AVG declot and found to have severe anemia (surprisingly asymptomatic) for which he is admitted. Admits to some transient melena last month. Hgb last week 7.8 and on 4000units procrit TIW at the outpatient HD center. Last HD Saturday and K today 5.7 Plan for HD today Jerremy Maione K.,MD 09/26/2011 4:06 PM

## 2011-09-26 NOTE — Procedures (Signed)
R IJ Hemosplit 23 HD Cath to SVC/Ra jct No ptx. No complication No sig. blood loss. See complete dictation in [facility].

## 2011-09-27 ENCOUNTER — Inpatient Hospital Stay (HOSPITAL_COMMUNITY): Payer: Medicare HMO

## 2011-09-27 DIAGNOSIS — Q2730 Arteriovenous malformation, site unspecified: Secondary | ICD-10-CM

## 2011-09-27 DIAGNOSIS — I517 Cardiomegaly: Secondary | ICD-10-CM

## 2011-09-27 LAB — DIFFERENTIAL
Basophils Absolute: 0 10*3/uL (ref 0.0–0.1)
Basophils Relative: 0 % (ref 0–1)
Lymphocytes Relative: 15 % (ref 12–46)
Neutro Abs: 6.3 10*3/uL (ref 1.7–7.7)

## 2011-09-27 LAB — CBC
MCHC: 33.6 g/dL (ref 30.0–36.0)
Platelets: 226 10*3/uL (ref 150–400)
RDW: 18.9 % — ABNORMAL HIGH (ref 11.5–15.5)
WBC: 9.4 10*3/uL (ref 4.0–10.5)

## 2011-09-27 LAB — BASIC METABOLIC PANEL
Calcium: 9 mg/dL (ref 8.4–10.5)
Chloride: 100 mEq/L (ref 96–112)
Creatinine, Ser: 8.72 mg/dL — ABNORMAL HIGH (ref 0.50–1.35)
GFR calc Af Amer: 7 mL/min — ABNORMAL LOW (ref >90)
GFR calc non Af Amer: 6 mL/min — ABNORMAL LOW (ref >90)

## 2011-09-27 LAB — GLUCOSE, CAPILLARY: Glucose-Capillary: 92 mg/dL (ref 70–99)

## 2011-09-27 LAB — CARDIAC PANEL(CRET KIN+CKTOT+MB+TROPI)
CK, MB: 2.6 ng/mL (ref 0.3–4.0)
Total CK: 293 U/L — ABNORMAL HIGH (ref 7–232)

## 2011-09-27 MED ORDER — VANCOMYCIN HCL IN DEXTROSE 1-5 GM/200ML-% IV SOLN: 1000.0000 mg | INTRAVENOUS | Status: DC

## 2011-09-27 MED ORDER — VANCOMYCIN HCL 1000 MG IV SOLR
2000.0000 mg | INTRAVENOUS | Status: AC
  Administered 2011-09-27: 2000 mg via INTRAVENOUS
  Filled 2011-09-27: qty 2000

## 2011-09-27 NOTE — Progress Notes (Signed)
  Echocardiogram 2D Echocardiogram has been performed.  [name] 09/27/2011, 3:37 PM

## 2011-09-27 NOTE — Consult Note (Signed)
[facility] Consult Note  Referring Provider: No ref. provider found Primary Care Physician:  [name], MD, MD Primary Gastroenterologist:  Dr.  Laurel Dimmer Complaint: Weakness HPI: [name] is an 60 y.o. black male.  Admitted with melena and a hemoglobin of 3. He has a long history of GI bleeding of obscure origin and underwent EGD colonoscopy and capsule endoscopy in August of this year. EGD showed only a single duodenal AVM which was cauterized, capsule endoscopy was reportedly negative and a colonoscopy showed diverticulosis with no bleeding and one small polyp. The patient denies any abdominal pain.  Past Medical History  Diagnosis Date  . Hyperthyroidism   . GASTROENTERITIS, VIRAL 10/14/2009  . ONYCHOMYCOSIS, TOENAILS 12/26/2007  . GOITER, MULTINODULAR 12/26/2007  . HYPERTHYROIDISM 02/02/2010  . DIABETES MELLITUS, TYPE II 02/01/2010  . DYSLIPIDEMIA 06/18/2007  . GOUT 06/18/2007  . Hypocalcemia 06/07/2010  . DEMENTIA 09/02/2009  . DEPRESSION 10/14/2009  . PERIPHERAL NEUROPATHY 06/18/2007  . HYPERTENSION 06/18/2007  . CAD, NATIVE VESSEL 02/06/2009  . CONGESTIVE HEART FAILURE 06/18/2007  . DIASTOLIC HEART FAILURE, CHRONIC 02/06/2009  . Unspecified hypotension 01/30/2010  . PULMONARY NODULE, RIGHT LOWER LOBE 06/08/2009  . GERD 06/18/2007  . ESRD 08/04/2010  . BENIGN PROSTATIC HYPERTROPHY 10/14/2009  . GYNECOMASTIA 07/17/2010  . NECK PAIN 07/31/2010  . FOOT PAIN 08/12/2008  . DIZZINESS 07/17/2010  . Other malaise and fatigue 11/24/2009  . GAIT DISTURBANCE 03/03/2010  . DYSPNEA 10/29/2008  . CHEST PAIN 03/29/2010  . TRANSAMINASES, SERUM, ELEVATED 02/01/2010  . COLONIC POLYPS, HX OF 10/14/2009  . Anemia 06/16/2011  . Ischemic cardiomyopathy 06/16/2011  . Hyperparathyroidism, secondary 06/16/2011    Past Surgical History  Procedure Date  . Stent 06/11/08   . Neck sugury 2/09  . Back surgury 1998    Medications Prior to Admission  Medication Dose Route Frequency Provider Last Rate Last  Dose  . 0.9 %  sodium chloride infusion   Intravenous STAT Leota Jacobsen, MD      . 0.9 %  sodium chloride infusion  100 mL Intravenous PRN Ulice Dash K. Posey Pronto, MD      . 0.9 %  sodium chloride infusion  100 mL Intravenous PRN Ulice Dash K. Posey Pronto, MD      . allopurinol (ZYLOPRIM) tablet 100 mg  100 mg Oral Daily Nishant Dhungel, MD   100 mg at 09/27/11 1003  . alteplase (CATHFLO ACTIVASE) injection 2 mg  2 mg Intracatheter Once PRN Ulice Dash K. Posey Pronto, MD      . amitriptyline (ELAVIL) tablet 50 mg  50 mg Oral Daily PRN Nishant Dhungel, MD      . calcium-vitamin D (OSCAL WITH D) 500-200 MG-UNIT per tablet 1 tablet  1 tablet Oral Daily Nishant Dhungel, MD   1 tablet at 09/27/11 1003  . carvedilol (COREG) tablet 3.125 mg  3.125 mg Oral BID WC Nishant Dhungel, MD   3.125 mg at 09/27/11 0753  . cinacalcet (SENSIPAR) tablet 60 mg  60 mg Oral BID Nishant Dhungel, MD   60 mg at 09/27/11 1003  . famotidine (PEPCID) tablet 20 mg  20 mg Oral BID Nishant Dhungel, MD   20 mg at 09/27/11 1003  . feeding supplement (NEPRO CARB STEADY) liquid 237 mL  237 mL Oral PRN Ulice Dash K. Posey Pronto, MD      . gabapentin (NEURONTIN) capsule 300 mg  300 mg Oral QHS PRN Nishant Dhungel, MD   300 mg at 09/26/11 2140  . heparin injection 1,000 Units  1,000 Units Dialysis PRN  Clayborne Dana. Posey Pronto, MD      . HYDROcodone-acetaminophen [facility]) 10-325 MG per tablet 2 tablet  2 tablet Oral Q6H PRN Louellen Molder, MD   1 tablet at 09/27/11 1002  . insulin aspart (novoLOG) injection 0-15 Units  0-15 Units Subcutaneous TID WC Nishant Dhungel, MD      . lidocaine (XYLOCAINE) 1 % injection 5 mL  5 mL Intradermal PRN Jay K. Posey Pronto, MD      . lidocaine-prilocaine (EMLA) cream 1 application  1 application Topical PRN Ulice Dash K. Posey Pronto, MD      . multivitamin (RENA-VIT) tablet 1 tablet  1 tablet Oral Daily Nishant Dhungel, MD   1 tablet at 09/27/11 1003  . omega-3 acid ethyl esters (LOVAZA) capsule 1 g  1 g Oral Daily Nishant Dhungel, MD   1 g at 09/27/11 1003  .  pentafluoroprop-tetrafluoroeth (GEBAUERS) aerosol 1 application  1 application Topical PRN Jay K. Posey Pronto, MD      . vancomycin (VANCOCIN) IVPB 1000 mg/200 mL premix  1,000 mg Intravenous Once Lavonia Drafts, PA   1,000 mg at 09/26/11 1239  . DISCONTD: 0.9 %  sodium chloride infusion   Intravenous Continuous Lavonia Drafts, PA      . DISCONTD: fentaNYL (SUBLIMAZE) 0.05 MG/ML injection           . DISCONTD: fentaNYL (SUBLIMAZE) injection   Intravenous PRN Rickard Rhymes III   50 mcg at 09/26/11 1239  . DISCONTD: heparin 1000 UNIT/ML injection           . DISCONTD: midazolam (VERSED) 2 MG/2ML injection           . DISCONTD: midazolam (VERSED) 5 MG/5ML injection   Intravenous PRN Dayne Arne Cleveland III   1 mg at 09/26/11 1238   Medications Prior to Admission  Medication Sig Dispense Refill  . allopurinol (ZYLOPRIM) 100 MG tablet Take 100 mg by mouth daily.        [name] aspirin 81 MG EC tablet Take 81 mg by mouth daily.        . calcium-vitamin D (OSCAL WITH D) 500-200 MG-UNIT per tablet 2 by mouth three times per week       . carvedilol (COREG) 12.5 MG tablet TAKE 1 TABLET TWICE DAILY  180 tablet  1  . cinacalcet (SENSIPAR) 60 MG tablet Take 60 mg by mouth 2 (two) times daily.        . clopidogrel (PLAVIX) 75 MG tablet Take 75 mg by mouth daily.        [name] gabapentin (NEURONTIN) 300 MG capsule Take 300 mg by mouth at bedtime as needed.        . multivitamin (RENA-VIT) TABS tablet Take 1 tablet by mouth daily.        . Omega-3 Fatty Acids (FISH OIL) 1000 MG CAPS Take by mouth 3 (three) times daily.        . ranitidine (ZANTAC) 300 MG capsule Take 300 mg by mouth daily.        . WELCHOL 3.75 G PACK MIX 1 PACKET IN WATER AND DRINK ONE TIME DAILY  90 each  3  . amitriptyline (ELAVIL) 50 MG tablet Take 50 mg by mouth daily as needed. As needed when feeling depressed.        Allergies:  Allergies  Allergen Reactions  . Cephalexin Other (See Comments)    Tongue swelling  . Statins      REACTION: perceived myalgias    Family History  Problem  Relation Age of Onset  . Heart disease Father   . Heart disease Sister     Social History:  reports that he has quit smoking. He does not have any smokeless tobacco history on file. He reports that he does not use illicit drugs. His alcohol history not on file.  Negative except as above   Blood pressure 132/83, pulse 105, temperature 100.2 F (37.9 C), temperature source Oral, resp. rate 23, height 6' (1.829 m), weight 117 kg (257 lb 15 oz), SpO2 97.00%. Head: Normocephalic, without obvious abnormality, atraumatic Neck: no adenopathy, no carotid bruit, no JVD, supple, symmetrical, trachea midline and thyroid not enlarged, symmetric, no tenderness/mass/nodules Resp: clear to auscultation bilaterally Cardio: regular rate and rhythm, S1, S2 normal, no murmur, click, rub or gallop GI: Abdomen soft slightly distended with normoactive bowel sounds no hepatomegaly masses or guarding Extremities: extremities normal, atraumatic, no cyanosis or edema  Results for orders placed during the hospital encounter of 09/26/11 (from the past 48 hour(s))  TYPE AND SCREEN     Status: Normal (Preliminary result)   Collection Time   09/26/11  2:20 PM      Component Value Range Comment   ABO/RH(D) O POS      Antibody Screen NEG      Sample Expiration 09/29/2011      Unit Number [id number]      Blood Component Type RED CELLS,LR      Unit division 00      Status of Unit ISSUED,FINAL      Transfusion Status OK TO TRANSFUSE      Crossmatch Result Compatible      Unit Number [id number]      Blood Component Type RED CELLS,LR      Unit division 00      Status of Unit ISSUED,FINAL      Transfusion Status OK TO TRANSFUSE      Crossmatch Result Compatible      Unit Number [id number]      Blood Component Type RED CELLS,LR      Unit division 00      Status of Unit ISSUED      Transfusion Status OK TO TRANSFUSE      Crossmatch Result Compatible       Unit Number [id number]      Blood Component Type RED CELLS,LR      Unit division 00      Status of Unit ISSUED      Transfusion Status OK TO TRANSFUSE      Crossmatch Result Compatible      Unit Number [id number]      Blood Component Type RED CELLS,LR      Unit division 00      Status of Unit ISSUED      Transfusion Status OK TO TRANSFUSE      Crossmatch Result Compatible     PREPARE RBC (CROSSMATCH)     Status: Normal   Collection Time   09/26/11  2:20 PM      Component Value Range Comment   Order Confirmation ORDER PROCESSED BY BLOOD BANK     PREPARE RBC (CROSSMATCH)     Status: Normal   Collection Time   09/26/11  4:30 PM      Component Value Range Comment   Order Confirmation ORDER PROCESSED BY BLOOD BANK     MRSA PCR SCREENING     Status: Normal   Collection Time   09/26/11  5:06 PM      Component  Value Range Comment   MRSA by PCR NEGATIVE  NEGATIVE    GLUCOSE, CAPILLARY     Status: Abnormal   Collection Time   09/26/11  6:27 PM      Component Value Range Comment   Glucose-Capillary 138 (*) 70 - 99 (mg/dL)    Comment 1 Notify RN     GLUCOSE, CAPILLARY     Status: Abnormal   Collection Time   09/26/11  9:28 PM      Component Value Range Comment   Glucose-Capillary 147 (*) 70 - 99 (mg/dL)    Comment 1 Notify RN      Comment 2 Documented in Chart     BASIC METABOLIC PANEL     Status: Abnormal   Collection Time   09/27/11  7:10 AM      Component Value Range Comment   Sodium 141  135 - 145 (mEq/L)    Potassium 4.7  3.5 - 5.1 (mEq/L) NO VISIBLE HEMOLYSIS   Chloride 100  96 - 112 (mEq/L)    CO2 26  19 - 32 (mEq/L)    Glucose, Bld 98  70 - 99 (mg/dL)    BUN 40 (*) 6 - 23 (mg/dL) DELTA CHECK NOTED   Creatinine, Ser 8.72 (*) 0.50 - 1.35 (mg/dL) DELTA CHECK NOTED   Calcium 9.0  8.4 - 10.5 (mg/dL)    GFR calc non Af Amer 6 (*) >90 (mL/min)    GFR calc Af Amer 7 (*) >90 (mL/min)   CBC     Status: Abnormal   Collection Time   09/27/11  7:10 AM      Component Value Range  Comment   WBC 9.4  4.0 - 10.5 (K/uL)    RBC 3.71 (*) 4.22 - 5.81 (MIL/uL)    Hemoglobin 11.1 (*) 13.0 - 17.0 (g/dL) POST TRANSFUSION SPECIMEN   HCT 33.0 (*) 39.0 - 52.0 (%)    MCV 88.9  78.0 - 100.0 (fL) POST TRANSFUSION SPECIMEN   MCH 29.9  26.0 - 34.0 (pg)    MCHC 33.6  30.0 - 36.0 (g/dL)    RDW 18.9 (*) 11.5 - 15.5 (%)    Platelets 226  150 - 400 (K/uL) DELTA CHECK NOTED  DIFFERENTIAL     Status: Abnormal   Collection Time   09/27/11  7:10 AM      Component Value Range Comment   Neutrophils Relative 68  43 - 77 (%)    Neutro Abs 6.3  1.7 - 7.7 (K/uL)    Lymphocytes Relative 15  12 - 46 (%)    Lymphs Abs 1.4  0.7 - 4.0 (K/uL)    Monocytes Relative 14 (*) 3 - 12 (%)    Monocytes Absolute 1.3 (*) 0.1 - 1.0 (K/uL)    Eosinophils Relative 3  0 - 5 (%)    Eosinophils Absolute 0.3  0.0 - 0.7 (K/uL)    Basophils Relative 0  0 - 1 (%)    Basophils Absolute 0.0  0.0 - 0.1 (K/uL)   GLUCOSE, CAPILLARY     Status: Normal   Collection Time   09/27/11  8:20 AM      Component Value Range Comment   Glucose-Capillary 92  70 - 99 (mg/dL)    Comment 1 Notify RN      Ct Abdomen Pelvis Wo Contrast  09/27/2011  *RADIOLOGY REPORT*  Clinical Data: Decreased hemoglobin; assess for retroperitoneal hematoma.  CT ABDOMEN AND PELVIS WITHOUT CONTRAST  Technique:  Multidetector CT imaging of  the abdomen and pelvis was performed following the standard protocol without intravenous contrast.  Comparison: MRCP performed 12/30/2009, and abdominal ultrasound performed 12/28/2009  Findings: Relatively diffuse patchy airspace opacification is noted within the right lung, concerning for right-sided pneumonia.  Trace bilateral pleural effusions noted, with associated atelectasis. Diffuse coronary artery calcifications are seen.  Air is noted filling the intrahepatic biliary ducts; this reflects prior sphincterotomy.  The liver is otherwise unremarkable in appearance.  The spleen is within normal limits.  The patient is  status post cholecystectomy, with clips noted at the gallbladder fossa.  The pancreas and adrenal glands are grossly unremarkable.  Bilateral renal atrophy is noted, with diffuse nonspecific bilateral perinephric stranding, and associated vascular calcifications at the renal hila.  There is no evidence of hydronephrosis.  No renal or ureteral stones are identified.  No free fluid is identified.  The small bowel is unremarkable in appearance.  The stomach is within normal limits.  No acute vascular abnormalities are seen.  Scattered calcification is noted along the abdominal aorta and its branches.  The appendix is normal in caliber and contains air, without evidence for appendicitis.  The colon is partially filled with stool and is unremarkable in appearance.  There is no evidence for retroperitoneal hematoma.  No focal fluid collections are identified.  The bladder is mildly distended and grossly unremarkable in appearance.  The prostate remains normal in size.  No inguinal lymphadenopathy is seen.  No acute osseous abnormalities are identified.  The patient is status post lumbar spinal fusion and decompression at L3-L5, with associated vacuum phenomenon and disc space narrowing noted along the upper lumbar spine.  IMPRESSION:  1.  No evidence of retroperitoneal hematoma. 2.  Patchy diffuse right-sided pneumonia noted.  3.  Trace bilateral pleural effusions, with associated atelectasis. 4.  Diffuse coronary artery calcifications seen. 5.  Air noted filling the intrahepatic biliary ducts, reflecting prior sphincterotomy. 6.  Bilateral renal atrophy. 7.  Scattered calcification along the abdominal aorta and its branches. 8.  Postoperative and degenerative changes noted along the lumbar spine.  Findings were discussed with Bella Kennedy RN on [id number] at 05:46 a.m. on 09/27/2011.  Original Report Authenticated By: Santa Lighter, M.D.   Ir Fluoro Guide Cv Line Right  09/26/2011  *RADIOLOGY REPORT*  Clinical data: Failed  right shunt angioplasty.  Needs access for hemodialysis.  TUNNELED HEMODIALYSIS CATHETER PLACEMENT WITH ULTRASOUND AND FLUOROSCOPIC GUIDANCE:  Comparison: None  Technique: The procedure, risks, benefits, and alternatives were explained to the patient and spouse.  Questions regarding the procedure were encouraged and answered.  The patient understands and consents to the procedure.  As antibiotic prophylaxis, vancomycin 1 gram IV was ordered pre- procedure and administered intravenously within one hour of incision.  Patency of the right IJ vein was confirmed with ultrasound with image documentation. An appropriate skin site was determined. Region was prepped using maximum barrier technique including cap and mask, sterile gown, sterile gloves, large sterile sheet, and Chlorhexidine   as cutaneous antisepsis. The region was infiltrated locally with 1% lidocaine.  Intravenous Fentanyl and Versed were administered as conscious sedation during continuous cardiorespiratory monitoring by the radiology RN, with a total moderate sedation time of 14 minutes.  Under real-time ultrasound guidance, the right IJ vein was accessed with a 21 gauge micropuncture needle; the needle tip within the vein was confirmed with ultrasound image documentation.   Needle exchanged over the 018 guidewire for transitional dilator, which allowed advancement of a Benson wire into the IVC. Over  this, an MPA catheter was advanced. A Hemosplit 23 hemodialysis catheter was tunneled from the right anterior chest wall approach to the right IJ dermatotomy site. The MPA catheter was exchanged over an Amplatz wire for serial vascular dilators which allow placement of a peel- away sheath, through which the catheter was advanced under intermittent fluoroscopy, positioned with its tips in the proximal and midright atrium. Spot chest radiograph confirms good catheter position. No pneumothorax. Catheter was flushed and primed per protocol. Catheter secured  externally with O Prolene sutures. The right IJ   dermatotomy site was closed   Dermabond. No immediate complication.  IMPRESSION:  1. Technically successful placement of tunneled right IJ hemodialysis catheter with ultrasound and fluoroscopic guidance. Ready for routine use.  Original Report Authenticated By: Trecia Rogers, M.D.    Assessment: Recurrent GI bleeding, source proximal to the colon suggested Plan:  Repeat EGD tomorrow and if unrevealing repeat capsule endoscopy. Billiejean Schimek C 09/27/2011, 10:40 AM

## 2011-09-27 NOTE — Progress Notes (Signed)
Subjective: Patient was at [facility] in the interventional radiology department having a dietetic catheter placed for hemodialysis access due to a clotted right AV graft. Routine blood work demonstrated hemoglobin 3.3. [facility] was subsequently [facility] for further evaluation. He reported to the admitting physician that he had been having melanotic stools for 4 weeks. They returned over the past 4 days and have been associated with excessive fatigue. No other GI symptoms were noted. Past medical history significant for inpatient evaluation in August 2012 for melena. That time he underwent EGD, colonoscopy, and capsule enteroscopy with normal results. Consideration was given for possible small bowel AVMs-during that admission he had a single duodenal AVM cauterized. He was also found to have colonic polyps and diverticula which were not bleeding. He is chronically on aspirin and Plavix after placement of drug-eluting stent in 2008.  Today he continues to endorse excessive fatigue which is he states "is worse than yesterday". He denies chest pain or shortness of breath. He has not had a bowel movement since admission so unclear if melena persists.  Objective: Blood pressure 153/93, pulse 83, temperature 97.8 F (36.6 C), temperature source Oral, resp. rate 18, height 6' (1.829 m), weight 117.6 kg (259 lb 4.2 oz), SpO2 98.00%.  Intake/Output from previous day: 12/26 0701 - 12/27 0700 In: 1312.5 [Blood:1312.5] Out: 3400 [Urine:100] Intake/Output this shift: Total I/O In: 200 [P.O.:200] Out: 0   General appearance: alert, cooperative, appears stated age and mild distress Resp: clear to auscultation bilaterally Cardio: regular rate and rhythm, S1, S2 normal, no murmur, click, rub or gallop GI: soft, non-tender; bowel sounds normal; no masses,  no organomegaly Extremities: extremities normal, atraumatic, no cyanosis, 1+ diffuse edema Neurologic: Grossly  normal  Lab Results:  Basename 09/27/11 0710 09/26/11 1208  WBC 9.4 9.9  HGB 11.1* 3.7*  HCT 33.0* 12.2*  PLT 226 311   BMET  Basename 09/27/11 0710 09/26/11 1208  NA 141 144  K 4.7 5.7*  CL 100 104  CO2 26 26  GLUCOSE 98 105*  BUN 40* 80*  CREATININE 8.72* 14.71*  CALCIUM 9.0 9.9    Studies/Results: Assessment/Plan:  Principal Problem:  *Anemia associated with acute blood loss on anemia of chronic disease Hemoglobin has increased from 3.7-11.1 after 5 units of packed red blood cells since admission. The fecal occult blood has been positive. Repeat CBC in the morning. Continue Procrit  Active Problems:  GI bleed presumed secondary to AVM (arteriovenous malformation) Appreciate gastroenterology assistance. Dr. Amedeo Plenty is concerned that the source may be proximal to the colon as previously suggested on prior EGD i.e. the solitary small bowel AVM. Plans are to repeat endoscopy on 09/28/2011 and if unrevealing repeat a capsule endoscopy procedure.   DIABETES MELLITUS, TYPE II CBGs have remained stable between 98 and 147 since admission. Continue sliding scale insulin. Based on medication reconciliation list patient was diet controlled and not on medications prior to admission.   DYSLIPIDEMIA Was on WelChol and omega-3 fatty acids at home.   NSTEMI/CAD, NATIVE VESSEL/Abnormal EKG/malaise and fatigue Because of recurrent GI bleeding aspirin and Plavix are on hold. If source is not fully clarified given the significant anemia he presented with it is likely we may need to hold one or both of these medications permanently. We may need to ask cardiology to see this patient in consultation for their recommendations regarding this issue. Blood pressure is still somewhat soft at times therefore will continue to hold usual Coreg.  In addition  he endorses persistent, excessive fatigue despite correction of his underlying anemia. I did review his EKG from 09/26/2011 and compared this to EKG  from earlier in the year. He does have T-wave inversions involving the inferior lateral leads as well as some ST segment depression less than 2 blocks. This is concerning for possible ischemia. We decided to check a cardiac isoenzyme this morning: total CK is 293 with a troponin I of 0.64, this is likely consistent with demand ischemia from profound anemia. We'll repeat another cardiac panel in the morning. Last 2-D echocardiogram was in 2010 so we will repeat this to check for regional wall motion abnormalities.   DIASTOLIC HEART FAILURE, CHRONIC/known ischemic cardiomyopathy  Review of old echo from 2010 demonstrated preserved LV function with grade 1 diastolic dysfunction. Clinically there is no evidence of exacerbation of heart failure even in the setting of profound anemia. We'll continue to follow   ESRD/Hyperparathyroidism, secondary Last dialysis was due on 09/24/2011 but was missed because of clotted access in right upper arm AV graft. Normal dialysis days are Tuesday Thursday and Saturday. Nephrology following the patient while here. Also noted to have low-grade fever this morning, Dr. Lorrene Reid has ordered blood cultures x2 and has begun empiric vancomycin. [facility] is also aware of the patient's admission and clotted graft and they will determine whether the AV graft can be salvaged.   GOITER, MULTINODULAR/hyperthyroidism Currently at home was not taking medications for either hypothyroidism or hyperthyroidism. If he develops symptoms suggestive of either problem would consider checking a TSH this admission.   GOUT On Zyloprim at home.   DEMENTIA/depression Continue Elavil   BENIGN PROSTATIC HYPERTROPHY Asymptomatic   Disposition Transfer to a telemetry unit   LOS: 1 day   Erin Hearing, ANP pager [phone]  [facility] [email or web address] Password: [email or web address]  09/27/2011, 12:44 PM  I have personally examined this patient and reviewed the entire database. I have reviewed the  above note, made any necessary editorial changes, and agree with its content.  Cherene Altes, MD [facility]

## 2011-09-27 NOTE — Progress Notes (Signed)
Subjective: Interval History:  Received dialysis and a total of 5 units of PRBC's Hb now up to 11.1 and pt says "feels worse than he did yesterday" Low grade fevers now 99-100  Objective:  Vital signs in last 24 hours:  Temp:  [97 F (36.1 C)-100.2 F (37.9 C)] 98.1 F (36.7 C) (12/27 1200) Pulse Rate:  [74-106] 92  (12/27 1200) Resp:  [11-26] 21  (12/27 1200) BP: (102-194)/(59-139) 102/59 mmHg (12/27 1200) SpO2:  [90 %-100 %] 94 % (12/27 1200) Weight:  [117 kg (257 lb 15 oz)-121.1 kg (266 lb 15.6 oz)] 257 lb 15 oz (117 kg) (12/27 0330) Weight change:   Intake/Output: I/O last 3 completed shifts: In: 1312.5 [Blood:1312.5] Out: [id number] [Urine:100; Other:3300]  Intake/Output this shift:  Total I/O In: 200 [P.O.:200] Out: 0  Net - 1887 since admission with HD     . sodium chloride   Intravenous STAT  . allopurinol  100 mg Oral Daily  . calcium-vitamin D  1 tablet Oral Daily  . carvedilol  3.125 mg Oral BID WC  . cinacalcet  60 mg Oral BID  . famotidine  20 mg Oral BID  . insulin aspart  0-15 Units Subcutaneous TID WC  . multivitamin  1 tablet Oral Daily  . omega-3 acid ethyl esters  1 g Oral Daily    EXAM: General appearance: alert, cooperative and no distress  Head: Normocephalic, without obvious abnormality, atraumatic  Neck: no adenopathy, no carotid bruit, no JVD and supple, symmetrical, trachea midline  Resp: clear to auscultation bilaterally  Cardio: regular rate and rhythm, S1, S2 normal, no murmur, click, rub or gallop  GI: soft, non-tender; bowel sounds normal; no masses, no organomegaly  Extremities: extremities normal, atraumatic, no edema  Dialysis Access: R IJ catheter. Non-occlusive gauze dressing; tender over site of insertion; non functional AVG right arm  Lab Results:  Basename 09/27/11 0710 09/26/11 1208  WBC 9.4 9.9  HGB 11.1* 3.7*  HCT 33.0* 12.2*  PLT 226 311   BMET  Basename 09/27/11 0710 09/26/11 1208  NA 141 144  K 4.7 5.7*  CL 100  104  CO2 26 26  GLUCOSE 98 105*  BUN 40* 80*  CREATININE 8.72* 14.71*  CALCIUM 9.0 9.9  PHOS -- --   LFT No results found for this basename: PROT,ALBUMIN,AST,ALT,ALKPHOS,BILITOT,BILIDIR,IBILI in the last 72 hours PT/INR  Basename 09/26/11 1208  LABPROT 12.6  INR 0.92   Hepatitis Panel No results found for this basename: HEPBSAG,HCVAB,HEPAIGM,HEPBIGM in the last 72 hours PTH: Lab Results  Component Value Date   CALCIUM 9.0 09/27/2011   CAION 1.10* 09/20/2011   PHOS 7.0* 05/10/2011    Studies/Results:  PROCEDURE: (09/21/11) 1. right forearm arteriovenous graft cannulation under ultrasound guidance 2. right arm shuntogram 3. Angioplasty of arteriovenous graft and brachial vein x 3 PRE-OPERATIVE DIAGNOSIS: Malfunctioning right arteriovenous graft  POST-OPERATIVE DIAGNOSIS: same as above  SURGEON: Adele Barthel, MD  ANESTHESIA: local  ESTIMATED BLOOD LOSS: 5 cc  FINDING(S):  1. 70% in-graft stenosis distally (prior jump graft anastomosis): < 30% after angioplasty 2. 50% stenosis at graft-brachial vein anastomosis: resolved after angioplasty 3. Widely patent central vein SPECIMEN(S): None  CONTRAST: 35 cc  INDICATIONS:  [name] is a 60 y.o. male who presents with malfunctioning right forearm arteriovenous graft with poor flow rates. The patient is scheduled for right arm shuntogram, possible intervention. The patient is aware the risks include but are not limited to: bleeding, infection, thrombosis of the cannulated access, and possible  anaphylactic reaction to the contrast. The patient is aware of the risks of the procedure and elects to proceed forward.  DESCRIPTION:  After full informed written consent was obtained, the patient was brought back to the angiography suite and placed supine upon the angiography table. The patient was connected to monitoring equipment. The right forearm was prepped and draped in the standard fashion for a right arm shuntogram. Under ultrasound  guidance, the right forearm arteriovenous graft was cannulated with a micropuncture needle. The microwire was advanced into the fistula and the needle was exchanged for the a microsheath, which was lodged 2 cm into the access. The wire was removed and the sheath was connected to the IV extension tubing. Hand injections were completed to image the access from the antecubitum up to the level of axilla. The central venous structures were also imaged by hand injections. Based on the images, this patient will need: angioplasty of the stenoses, both at jump graft anastomoses. The patient was given 3000 units of Heparin intravenously. A Benson wire was passed through the sheath up into the axillary vein. The sheath was exchanged for a 6-Fr short sheath. Both stenoses were treated with a 6.0 mm x 20 mm angioplasty balloon at 12 atm for 1 minute. The distal lesion appeared adequately treated but I felt the proximal lesion needed additional dilation. I exchanged the balloon for a 7.0 mm x 20 mm angioplasty balloon which was inflated to 12 atm for 1 minute. The balloon was deflated and removed. The completion angiogram demonstrated complete resolution of the stenosis. The wire was removed and a 4-0 Monocryl purse-string suture was sewn around the sheath. The sheath was removed while tying down the suture. A sterile bandage was applied to the puncture site.  COMPLICATIONS: none  CONDITION: stable  Adele Barthel, MD  [facility]  Office: [phone]  Pager: [phone]  09/20/2011 11:09 AM   Ct Abdomen Pelvis Wo Contrast  09/27/2011  *RADIOLOGY REPORT*  Clinical Data: Decreased hemoglobin; assess for retroperitoneal hematoma.  CT ABDOMEN AND PELVIS WITHOUT CONTRAST  Technique:  Multidetector CT imaging of the abdomen and pelvis was performed following the standard protocol without intravenous contrast.  Comparison: MRCP performed 12/30/2009, and abdominal ultrasound performed 12/28/2009   Findings: Relatively diffuse patchy airspace opacification is noted within the right lung, concerning for right-sided pneumonia.  Trace bilateral pleural effusions noted, with associated atelectasis. Diffuse coronary artery calcifications are seen.  Air is noted filling the intrahepatic biliary ducts; this reflects prior sphincterotomy.  The liver is otherwise unremarkable in appearance.  The spleen is within normal limits.  The patient is status post cholecystectomy, with clips noted at the gallbladder fossa.  The pancreas and adrenal glands are grossly unremarkable.  Bilateral renal atrophy is noted, with diffuse nonspecific bilateral perinephric stranding, and associated vascular calcifications at the renal hila.  There is no evidence of hydronephrosis.  No renal or ureteral stones are identified.  No free fluid is identified.  The small bowel is unremarkable in appearance.  The stomach is within normal limits.  No acute vascular abnormalities are seen.  Scattered calcification is noted along the abdominal aorta and its branches.  The appendix is normal in caliber and contains air, without evidence for appendicitis.  The colon is partially filled with stool and is unremarkable in appearance.  There is no evidence for retroperitoneal hematoma.  No focal fluid collections are identified.  The bladder is mildly distended and grossly unremarkable in appearance.  The  prostate remains normal in size.  No inguinal lymphadenopathy is seen.  No acute osseous abnormalities are identified.  The patient is status post lumbar spinal fusion and decompression at L3-L5, with associated vacuum phenomenon and disc space narrowing noted along the upper lumbar spine.  IMPRESSION:  1.  No evidence of retroperitoneal hematoma. 2.  Patchy diffuse right-sided pneumonia noted.  3.  Trace bilateral pleural effusions, with associated atelectasis. 4.  Diffuse coronary artery calcifications seen. 5.  Air noted filling the intrahepatic  biliary ducts, reflecting prior sphincterotomy. 6.  Bilateral renal atrophy. 7.  Scattered calcification along the abdominal aorta and its branches. 8.  Postoperative and degenerative changes noted along the lumbar spine.  Findings were discussed with Bella Kennedy RN on [id number] at 05:46 a.m. on 09/27/2011.  Original Report Authenticated By: Santa Lighter, M.D.   Ir Fluoro Guide Cv Line Right  09/26/2011  *RADIOLOGY REPORT*  Clinical data: Failed right shunt angioplasty.  Needs access for hemodialysis.  TUNNELED HEMODIALYSIS CATHETER PLACEMENT WITH ULTRASOUND AND FLUOROSCOPIC GUIDANCE:  Comparison: None  Technique: The procedure, risks, benefits, and alternatives were explained to the patient and spouse.  Questions regarding the procedure were encouraged and answered.  The patient understands and consents to the procedure.  As antibiotic prophylaxis, vancomycin 1 gram IV was ordered pre- procedure and administered intravenously within one hour of incision.  Patency of the right IJ vein was confirmed with ultrasound with image documentation. An appropriate skin site was determined. Region was prepped using maximum barrier technique including cap and mask, sterile gown, sterile gloves, large sterile sheet, and Chlorhexidine   as cutaneous antisepsis. The region was infiltrated locally with 1% lidocaine.  Intravenous Fentanyl and Versed were administered as conscious sedation during continuous cardiorespiratory monitoring by the radiology RN, with a total moderate sedation time of 14 minutes.  Under real-time ultrasound guidance, the right IJ vein was accessed with a 21 gauge micropuncture needle; the needle tip within the vein was confirmed with ultrasound image documentation.   Needle exchanged over the 018 guidewire for transitional dilator, which allowed advancement of a Benson wire into the IVC. Over this, an MPA catheter was advanced. A Hemosplit 23 hemodialysis catheter was tunneled from the right anterior chest  wall approach to the right IJ dermatotomy site. The MPA catheter was exchanged over an Amplatz wire for serial vascular dilators which allow placement of a peel- away sheath, through which the catheter was advanced under intermittent fluoroscopy, positioned with its tips in the proximal and midright atrium. Spot chest radiograph confirms good catheter position. No pneumothorax. Catheter was flushed and primed per protocol. Catheter secured externally with O Prolene sutures. The right IJ   dermatotomy site was closed   Dermabond. No immediate complication.  IMPRESSION:  1. Technically successful placement of tunneled right IJ hemodialysis catheter with ultrasound and fluoroscopic guidance. Ready for routine use.  Original Report Authenticated By: Trecia Rogers, M.D.   Ir US Guide Vasc Access Right  09/26/2011  *RADIOLOGY REPORT*  Clinical data: Failed right shunt angioplasty.  Needs access for hemodialysis.  TUNNELED HEMODIALYSIS CATHETER PLACEMENT WITH ULTRASOUND AND FLUOROSCOPIC GUIDANCE:  Comparison: None  Technique: The procedure, risks, benefits, and alternatives were explained to the patient and spouse.  Questions regarding the procedure were encouraged and answered.  The patient understands and consents to the procedure.  As antibiotic prophylaxis, vancomycin 1 gram IV was ordered pre- procedure and administered intravenously within one hour of incision.  Patency of the right IJ vein was confirmed  with ultrasound with image documentation. An appropriate skin site was determined. Region was prepped using maximum barrier technique including cap and mask, sterile gown, sterile gloves, large sterile sheet, and Chlorhexidine   as cutaneous antisepsis. The region was infiltrated locally with 1% lidocaine.  Intravenous Fentanyl and Versed were administered as conscious sedation during continuous cardiorespiratory monitoring by the radiology RN, with a total moderate sedation time of 14 minutes.  Under  real-time ultrasound guidance, the right IJ vein was accessed with a 21 gauge micropuncture needle; the needle tip within the vein was confirmed with ultrasound image documentation.   Needle exchanged over the 018 guidewire for transitional dilator, which allowed advancement of a Benson wire into the IVC. Over this, an MPA catheter was advanced. A Hemosplit 23 hemodialysis catheter was tunneled from the right anterior chest wall approach to the right IJ dermatotomy site. The MPA catheter was exchanged over an Amplatz wire for serial vascular dilators which allow placement of a peel- away sheath, through which the catheter was advanced under intermittent fluoroscopy, positioned with its tips in the proximal and midright atrium. Spot chest radiograph confirms good catheter position. No pneumothorax. Catheter was flushed and primed per protocol. Catheter secured externally with O Prolene sutures. The right IJ   dermatotomy site was closed   Dermabond. No immediate complication.  IMPRESSION:  1. Technically successful placement of tunneled right IJ hemodialysis catheter with ultrasound and fluoroscopic guidance. Ready for routine use.  Original Report Authenticated By: Trecia Rogers, M.D.    Assessment/Plan: Assessment/Plan:  1. Anemia - Hgb of 3.7 up to 11.1 after 5 units of blood; GI has seen; has history of gi bleeding in past with thorough workup in past showing only a duodenal AVM; melena suggests upper source; EGD planned for tomorrow by Dr. Amedeo Plenty 2. ESRD - on HD on TTS, but missed last HD on 12/24, secondary to clotted access, last HD on Saturday 12/22, Received dialysis late last night/early this AM so next RX will be on Saturday 3. HTN/ Volume - BP slightly high, on Carvedilol  4. Metabolic bone disease - On Calcium Carbonate with meals and Sensipar, off Zemplar.  5. Nutrition - Alb stable, last 4.3. 6. Low grade fever (100.2 at 8AM)  with new permcath in place; check blood cultures X 2;  empiric vanco pending results 7.  Dialysis access - see above shuntogram/PTA results - with subsequent graft thrombosis; [facility] is aware of patient; will need to determine if AVG can be salvaged. Has permcath for now.     LOS: 1 Lekia Nier B @TODAY @12 :32 PM

## 2011-09-27 NOTE — Progress Notes (Signed)
Patient transferred to 4730. Report called to RN on 4700 and all questions answered, left message with wife, with new room number. Patient transferred via wheelchair with RN and NT.

## 2011-09-27 NOTE — Progress Notes (Signed)
ANTIBIOTIC CONSULT NOTE - INITIAL  Pharmacy Consult for Vancomycin Indication: fever in HD  Allergies  Allergen Reactions  . Cephalexin Other (See Comments)    Tongue swelling  . Statins     REACTION: perceived myalgias    Patient Measurements: Height: 6' (182.9 cm) Weight: 257 lb 15 oz (117 kg) IBW/kg (Calculated) : 77.6  Adjusted Body Weight:   Vital Signs: Temp: 98.1 F (36.7 C) (12/27 1200) Temp src: Oral (12/27 1200) BP: 102/59 mmHg (12/27 1200) Pulse Rate: 92  (12/27 1200) Intake/Output from previous day: 12/26 0701 - 12/27 0700 In: 1312.5 [Blood:1312.5] Out: [id number] [Urine:100] Intake/Output from this shift: Total I/O In: 200 [P.O.:200] Out: 0   Labs:  [facility] 09/27/11 0710 09/26/11 1208  WBC 9.4 9.9  HGB 11.1* 3.7*  PLT 226 311  LABCREA -- --  CREATININE 8.72* 14.71*   Estimated Creatinine Clearance: 11.6 ml/min (by C-G formula based on Cr of 8.72). No results found for this basename: VANCOTROUGH:2,VANCOPEAK:2,VANCORANDOM:2,GENTTROUGH:2,GENTPEAK:2,GENTRANDOM:2,TOBRATROUGH:2,TOBRAPEAK:2,TOBRARND:2,AMIKACINPEAK:2,AMIKACINTROU:2,AMIKACIN:2, in the last 72 hours   Microbiology: Recent Results (from the past 720 hour(s))  MRSA PCR SCREENING     Status: Normal   Collection Time   09/26/11  5:06 PM      Component Value Range Status Comment   MRSA by PCR NEGATIVE  NEGATIVE  Final     Medical History: Past Medical History  Diagnosis Date  . Hyperthyroidism   . GASTROENTERITIS, VIRAL 10/14/2009  . ONYCHOMYCOSIS, TOENAILS 12/26/2007  . GOITER, MULTINODULAR 12/26/2007  . HYPERTHYROIDISM 02/02/2010  . DIABETES MELLITUS, TYPE II 02/01/2010  . DYSLIPIDEMIA 06/18/2007  . GOUT 06/18/2007  . Hypocalcemia 06/07/2010  . DEMENTIA 09/02/2009  . DEPRESSION 10/14/2009  . PERIPHERAL NEUROPATHY 06/18/2007  . HYPERTENSION 06/18/2007  . CAD, NATIVE VESSEL 02/06/2009  . CONGESTIVE HEART FAILURE 06/18/2007  . DIASTOLIC HEART FAILURE, CHRONIC 02/06/2009  . Unspecified hypotension  01/30/2010  . PULMONARY NODULE, RIGHT LOWER LOBE 06/08/2009  . GERD 06/18/2007  . ESRD 08/04/2010  . BENIGN PROSTATIC HYPERTROPHY 10/14/2009  . GYNECOMASTIA 07/17/2010  . NECK PAIN 07/31/2010  . FOOT PAIN 08/12/2008  . DIZZINESS 07/17/2010  . Other malaise and fatigue 11/24/2009  . GAIT DISTURBANCE 03/03/2010  . DYSPNEA 10/29/2008  . CHEST PAIN 03/29/2010  . TRANSAMINASES, SERUM, ELEVATED 02/01/2010  . COLONIC POLYPS, HX OF 10/14/2009  . Anemia 06/16/2011  . Ischemic cardiomyopathy 06/16/2011  . Hyperparathyroidism, secondary 06/16/2011    Medications:  Prescriptions prior to admission  Medication Sig Dispense Refill  . allopurinol (ZYLOPRIM) 100 MG tablet Take 100 mg by mouth daily.        [name] aspirin 81 MG EC tablet Take 81 mg by mouth daily.        . calcium-vitamin D (OSCAL WITH D) 500-200 MG-UNIT per tablet 2 by mouth three times per week       . carvedilol (COREG) 12.5 MG tablet TAKE 1 TABLET TWICE DAILY  180 tablet  1  . cinacalcet (SENSIPAR) 60 MG tablet Take 60 mg by mouth 2 (two) times daily.        . clopidogrel (PLAVIX) 75 MG tablet Take 75 mg by mouth daily.        [name] epoetin alfa (EPOGEN,PROCRIT) 3000 UNIT/ML injection Inject 3,000 Units into the skin 3 (three) times a week. With dialysis.       [name] gabapentin (NEURONTIN) 300 MG capsule Take 300 mg by mouth at bedtime as needed.        . multivitamin (RENA-VIT) TABS tablet Take 1 tablet by mouth daily.        [name]  Omega-3 Fatty Acids (FISH OIL) 1000 MG CAPS Take by mouth 3 (three) times daily.        . ranitidine (ZANTAC) 300 MG capsule Take 300 mg by mouth daily.        . WELCHOL 3.75 G PACK MIX 1 PACKET IN WATER AND DRINK ONE TIME DAILY  90 each  3  . amitriptyline (ELAVIL) 50 MG tablet Take 50 mg by mouth daily as needed. As needed when feeling depressed.       Scheduled:    . sodium chloride   Intravenous STAT  . allopurinol  100 mg Oral Daily  . calcium-vitamin D  1 tablet Oral Daily  . carvedilol  3.125 mg Oral BID WC  .  cinacalcet  60 mg Oral BID  . famotidine  20 mg Oral BID  . insulin aspart  0-15 Units Subcutaneous TID WC  . multivitamin  1 tablet Oral Daily  . omega-3 acid ethyl esters  1 g Oral Daily  . vancomycin  2,000 mg Intravenous NOW  . vancomycin  1,000 mg Intravenous Q T,Th,Sa-HD   Assessment: 60 yo M to begin vancomycin for fever in HD. Patient had a new perm cath placed recently.  HD Schedule: TTS  Goal of Therapy:  pre-HD level 15-25  Plan:  1. Vancomycin 2000 mg IV now then 1000 mg after HD TTS 2. Will f/u clinical progress  , Margot Chimes 09/27/2011,1:22 PM

## 2011-09-28 ENCOUNTER — Encounter (HOSPITAL_COMMUNITY): Payer: Self-pay | Admitting: *Deleted

## 2011-09-28 ENCOUNTER — Ambulatory Visit: Payer: Medicare HMO | Admitting: Internal Medicine

## 2011-09-28 ENCOUNTER — Encounter (HOSPITAL_COMMUNITY)
Admission: EM | Disposition: A | Discharge status: Home / Self Care | Payer: Self-pay | Source: Home / Self Care | Attending: Internal Medicine

## 2011-09-28 DIAGNOSIS — I214 Non-ST elevation (NSTEMI) myocardial infarction: Secondary | ICD-10-CM

## 2011-09-28 DIAGNOSIS — I2510 Atherosclerotic heart disease of native coronary artery without angina pectoris: Secondary | ICD-10-CM | POA: Diagnosis present

## 2011-09-28 HISTORY — PX: ESOPHAGOGASTRODUODENOSCOPY: SHX5428

## 2011-09-28 LAB — TYPE AND SCREEN
ABO/RH(D): O POS
Antibody Screen: NEGATIVE
Unit division: 0
Unit division: 0
Unit division: 0

## 2011-09-28 LAB — RENAL FUNCTION PANEL
BUN: 57 mg/dL — ABNORMAL HIGH (ref 6–23)
CO2: 23 mEq/L (ref 19–32)
Calcium: 8.2 mg/dL — ABNORMAL LOW (ref 8.4–10.5)
GFR calc Af Amer: 5 mL/min — ABNORMAL LOW (ref >90)
Glucose, Bld: 102 mg/dL — ABNORMAL HIGH (ref 70–99)
Phosphorus: 5.5 mg/dL — ABNORMAL HIGH (ref 2.3–4.6)
Potassium: 5.3 mEq/L — ABNORMAL HIGH (ref 3.5–5.1)
Sodium: 139 mEq/L (ref 135–145)

## 2011-09-28 LAB — CBC
HCT: 30 % — ABNORMAL LOW (ref 39.0–52.0)
Hemoglobin: 9.8 g/dL — ABNORMAL LOW (ref 13.0–17.0)
MCH: 29.5 pg (ref 26.0–34.0)
MCHC: 32.7 g/dL (ref 30.0–36.0)
RBC: 3.32 MIL/uL — ABNORMAL LOW (ref 4.22–5.81)

## 2011-09-28 LAB — GLUCOSE, CAPILLARY
Glucose-Capillary: 104 mg/dL — ABNORMAL HIGH (ref 70–99)
Glucose-Capillary: 94 mg/dL (ref 70–99)
Glucose-Capillary: 102 mg/dL — ABNORMAL HIGH (ref 70–99)

## 2011-09-28 LAB — CARDIAC PANEL(CRET KIN+CKTOT+MB+TROPI)
Relative Index: 0.8 (ref 0.0–2.5)
Total CK: 368 U/L — ABNORMAL HIGH (ref 7–232)
Troponin I: 0.42 ng/mL (ref <0.30)

## 2011-09-28 SURGERY — EGD (ESOPHAGOGASTRODUODENOSCOPY)
Anesthesia: Moderate Sedation

## 2011-09-28 MED ORDER — FAMOTIDINE 20 MG PO TABS
20.0000 mg | ORAL_TABLET | Freq: Every day | ORAL | Status: DC
  Administered 2011-09-29 – 2011-10-01 (×3): 20 mg via ORAL
  Filled 2011-09-28 (×3): qty 1

## 2011-09-28 MED ORDER — CARVEDILOL 6.25 MG PO TABS
6.2500 mg | ORAL_TABLET | Freq: Two times a day (BID) | ORAL | Status: DC
  Administered 2011-09-28 – 2011-10-01 (×5): 6.25 mg via ORAL
  Filled 2011-09-28 (×8): qty 1

## 2011-09-28 MED ORDER — MIDAZOLAM HCL 10 MG/2ML IJ SOLN
INTRAMUSCULAR | Status: DC | PRN
  Administered 2011-09-28 (×3): 2 mg via INTRAVENOUS

## 2011-09-28 MED ORDER — MIDAZOLAM HCL 10 MG/2ML IJ SOLN
INTRAMUSCULAR | Status: AC
  Filled 2011-09-28: qty 4

## 2011-09-28 MED ORDER — HEPARIN SODIUM (PORCINE) 1000 UNIT/ML DIALYSIS
20.0000 [IU]/kg | INTRAMUSCULAR | Status: DC | PRN
  Filled 2011-09-28: qty 3

## 2011-09-28 MED ORDER — BUTAMBEN-TETRACAINE-BENZOCAINE 2-2-14 % EX AERO
INHALATION_SPRAY | CUTANEOUS | Status: DC | PRN
  Administered 2011-09-28: 2 via TOPICAL

## 2011-09-28 MED ORDER — SODIUM CHLORIDE 0.9 % IV SOLN
INTRAVENOUS | Status: DC
  Administered 2011-09-28: 500 mL via INTRAVENOUS

## 2011-09-28 MED ORDER — FENTANYL CITRATE 0.05 MG/ML IJ SOLN
INTRAMUSCULAR | Status: AC
  Filled 2011-09-28: qty 4

## 2011-09-28 MED ORDER — FENTANYL NICU IV SYRINGE 50 MCG/ML
INJECTION | INTRAMUSCULAR | Status: DC | PRN
  Administered 2011-09-28 (×3): 25 ug via INTRAVENOUS

## 2011-09-28 MED ORDER — DIPHENHYDRAMINE HCL 50 MG/ML IJ SOLN
INTRAMUSCULAR | Status: AC
  Filled 2011-09-28: qty 1

## 2011-09-28 NOTE — Brief Op Note (Signed)
09/26/2011 - 09/28/2011  10:29 AM  PATIENT:  [name]  60 y.o. male  PRE-OPERATIVE DIAGNOSIS:  Anemia and heme positive stools  POST-OPERATIVE DIAGNOSIS:  Normal  PROCEDURE:  Procedure(s): ESOPHAGOGASTRODUODENOSCOPY (EGD)  SURGEON:  Surgeon(s): Missy Sabins, MD

## 2011-09-28 NOTE — Progress Notes (Signed)
ANTIBIOTIC CONSULT NOTE - FOLLOW UP  Pharmacy Consult for Vancomycin  Indication: Fever in HD  Allergies  Allergen Reactions  . Cephalexin Other (See Comments)    Tongue swelling  . Statins     REACTION: perceived myalgias    Patient Measurements: Height: 6' (182.9 cm) Weight: 261 lb 11 oz (118.7 kg) IBW/kg (Calculated) : 77.6    Vital Signs: Temp: 99 F (37.2 C) (12/28 0538) Temp src: Axillary (12/28 0538) BP: 149/66 mmHg (12/28 1047) Pulse Rate: 81  (12/28 0837)  Intake/Output from previous day: 12/27 0701 - 12/28 0700 In: 980 [P.O.:480; IV Piggyback:500] Out: 0  Intake/Output from this shift:    Labs:  [facility] 09/28/11 0620 09/27/11 0710 09/26/11 1208  WBC 7.7 9.4 9.9  HGB 9.8* 11.1* 3.7*  PLT 183 226 311  LABCREA -- -- --  CREATININE 11.60* 8.72* 14.71*   Estimated Creatinine Clearance: 8.8 ml/min (by C-G formula based on Cr of 11.6). No results found for this basename: VANCOTROUGH:2,VANCOPEAK:2,VANCORANDOM:2,GENTTROUGH:2,GENTPEAK:2,GENTRANDOM:2,TOBRATROUGH:2,TOBRAPEAK:2,TOBRARND:2,AMIKACINPEAK:2,AMIKACINTROU:2,AMIKACIN:2, in the last 72 hours   Microbiology: Recent Results (from the past 720 hour(s))  MRSA PCR SCREENING     Status: Normal   Collection Time   09/26/11  5:06 PM      Component Value Range Status Comment   MRSA by PCR NEGATIVE  NEGATIVE  Final     Anti-infectives     Start     Dose/Rate Route Frequency Ordered Stop   09/29/11 1200   vancomycin (VANCOCIN) IVPB 1000 mg/200 mL premix        1,000 mg 200 mL/hr over 60 Minutes Intravenous Every T-Th-Sa (Hemodialysis) 09/27/11 1319     09/27/11 1330   vancomycin (VANCOCIN) 2,000 mg in sodium chloride 0.9 % 500 mL IVPB        2,000 mg 250 mL/hr over 120 Minutes Intravenous NOW 09/27/11 1319 09/27/11 1605          Assessment: 61 yo M to begin vancomycin for fever in HD. Patient had a new perm cath placed recently. HD schedule TTS.    Goal of Therapy:  pre-HD level 15-25  Plan:    1. Continue Vancomycin 1000 mg IV after HD TTS 2. F/u clinical progress 3. Monitor vanc trough levels prn    Allene Pyo 09/28/2011,2:55 PM

## 2011-09-28 NOTE — Progress Notes (Signed)
[facility] Progress Note  Subjective: Patient feeling fine no more black stools reported  Objective: Vital signs in last 24 hours: Temp:  [97.8 F (36.6 C)-99 F (37.2 C)] 99 F (37.2 C) (12/28 0538) Pulse Rate:  [81-92] 81  (12/28 0837) Resp:  [13-62] 13  (12/28 1034) BP: (102-181)/(59-98) 147/73 mmHg (12/28 1034) SpO2:  [92 %-99 %] 94 % (12/28 1034) Weight:  [117.6 kg (259 lb 4.2 oz)-118.7 kg (261 lb 11 oz)] 261 lb 11 oz (118.7 kg) (12/28 0538) Weight change: -3.2 kg (-7 lb 0.9 oz)   PE: Unchanged  Lab Results: Results for orders placed during the hospital encounter of 09/26/11 (from the past 24 hour(s))  CARDIAC PANEL(CRET KIN+CKTOT+MB+TROPI)     Status: Abnormal   Collection Time   09/27/11 10:40 AM      Component Value Range   Total CK 293 (*) 7 - 232 (U/L)   CK, MB 2.6  0.3 - 4.0 (ng/mL)   Troponin I 0.64 (*) <0.30 (ng/mL)   Relative Index 0.9  0.0 - 2.5   GLUCOSE, CAPILLARY     Status: Abnormal   Collection Time   09/27/11 12:18 PM      Component Value Range   Glucose-Capillary 142 (*) 70 - 99 (mg/dL)   Comment 1 Notify RN    GLUCOSE, CAPILLARY     Status: Abnormal   Collection Time   09/27/11  4:20 PM      Component Value Range   Glucose-Capillary 118 (*) 70 - 99 (mg/dL)  CBC     Status: Abnormal   Collection Time   09/28/11  6:20 AM      Component Value Range   WBC 7.7  4.0 - 10.5 (K/uL)   RBC 3.32 (*) 4.22 - 5.81 (MIL/uL)   Hemoglobin 9.8 (*) 13.0 - 17.0 (g/dL)   HCT 30.0 (*) 39.0 - 52.0 (%)   MCV 90.4  78.0 - 100.0 (fL)   MCH 29.5  26.0 - 34.0 (pg)   MCHC 32.7  30.0 - 36.0 (g/dL)   RDW 18.9 (*) 11.5 - 15.5 (%)   Platelets 183  150 - 400 (K/uL)  RENAL FUNCTION PANEL     Status: Abnormal   Collection Time   09/28/11  6:20 AM      Component Value Range   Sodium 139  135 - 145 (mEq/L)   Potassium 5.3 (*) 3.5 - 5.1 (mEq/L)   Chloride 101  96 - 112 (mEq/L)   CO2 23  19 - 32 (mEq/L)   Glucose, Bld 102 (*) 70 - 99 (mg/dL)   BUN 57 (*) 6 -  23 (mg/dL)   Creatinine, Ser 11.60 (*) 0.50 - 1.35 (mg/dL)   Calcium 8.2 (*) 8.4 - 10.5 (mg/dL)   Phosphorus 5.5 (*) 2.3 - 4.6 (mg/dL)   Albumin 2.8 (*) 3.5 - 5.2 (g/dL)   GFR calc non Af Amer 4 (*) >90 (mL/min)   GFR calc Af Amer 5 (*) >90 (mL/min)  CARDIAC PANEL(CRET KIN+CKTOT+MB+TROPI)     Status: Abnormal   Collection Time   09/28/11  6:20 AM      Component Value Range   Total CK 368 (*) 7 - 232 (U/L)   CK, MB 3.0  0.3 - 4.0 (ng/mL)   Troponin I 0.42 (*) <0.30 (ng/mL)   Relative Index 0.8  0.0 - 2.5   GLUCOSE, CAPILLARY     Status: Abnormal   Collection Time   09/28/11  6:24 AM  Component Value Range   Glucose-Capillary 104 (*) 70 - 99 (mg/dL)    Studies/Results: CT scan of the abdomen shows no source of hemobilia. EGD is completely unremarkable.     Assessment: Recurrent GI bleeding of obscure origin, source proximal to the colon suggested  Plan: Since hemobilia has been ruled out and he continues to bleed will repeat capsule endoscopy.    Dougles Kimmey C 09/28/2011, 10:35 AM

## 2011-09-28 NOTE — Consult Note (Signed)
Primary Cardiologist: Pierre Bali  History of Present Illness: 60 yo male with history of CAD s/p DES in the RCA 2008, DM, ESRD on HD, ischemic cardiomyopathy, diastolic heart failure, HTN, HLD, OSA, COPD, morbid obesity who was admitted with GI bleeding and anemia, Hbg 3.7 on admission. His ASA and Plavix has been held. Cardiac enzymes are positive. EKG from 09/26/11 with T wave inversions in anterolateral leads. No EKG on file today. He has no c/o chest pain. Most recent echocardiogram yesterday with normal LV function (outlined below). Myoview 2010 had EF of 43% with inferior scar and no ischemia. Had [place] in 2/11 which showed low pressures (PCWP = 2) and normal cardiac output. He was last seen in February of 2012 by Dr. [name]. We are asked to consult in regards to his abnormal cardiac markers and Plavix therapy in setting of a near catastrophic GI bleed.   He tells me that he felt weak on admission but had no chest pain or SOB. He has had no exertional chest pain at home. He has been noticing melena for several weeks. EGD today with no upper GI source of bleeding.    Past Medical History  Diagnosis Date  . Hyperthyroidism   . GASTROENTERITIS, VIRAL 10/14/2009  . ONYCHOMYCOSIS, TOENAILS 12/26/2007  . GOITER, MULTINODULAR 12/26/2007  . HYPERTHYROIDISM 02/02/2010  . DIABETES MELLITUS, TYPE II 02/01/2010  . DYSLIPIDEMIA 06/18/2007  . GOUT 06/18/2007  . Hypocalcemia 06/07/2010  . DEMENTIA 09/02/2009  . DEPRESSION 10/14/2009  . PERIPHERAL NEUROPATHY 06/18/2007  . HYPERTENSION 06/18/2007  . CAD, NATIVE VESSEL 02/06/2009  . CONGESTIVE HEART FAILURE 06/18/2007  . DIASTOLIC HEART FAILURE, CHRONIC 02/06/2009  . Unspecified hypotension 01/30/2010  . PULMONARY NODULE, RIGHT LOWER LOBE 06/08/2009  . GERD 06/18/2007  . ESRD 08/04/2010  . BENIGN PROSTATIC HYPERTROPHY 10/14/2009  . GYNECOMASTIA 07/17/2010  . NECK PAIN 07/31/2010  . FOOT PAIN 08/12/2008  . DIZZINESS 07/17/2010  . Other malaise and fatigue  11/24/2009  . GAIT DISTURBANCE 03/03/2010  . DYSPNEA 10/29/2008  . CHEST PAIN 03/29/2010  . TRANSAMINASES, SERUM, ELEVATED 02/01/2010  . COLONIC POLYPS, HX OF 10/14/2009  . Anemia 06/16/2011  . Ischemic cardiomyopathy 06/16/2011  . Hyperparathyroidism, secondary 06/16/2011  GI bleeding as above  Past Surgical History  Procedure Date  . Stent 06/11/08   . Neck sugury 2/09  . Back surgury 1998    Current Facility-Administered Medications  Medication Dose Route Frequency Provider Last Rate Last Dose  . allopurinol (ZYLOPRIM) tablet 100 mg  100 mg Oral Daily Nishant Dhungel, MD   100 mg at 09/27/11 1003  . amitriptyline (ELAVIL) tablet 50 mg  50 mg Oral Daily PRN Nishant Dhungel, MD      . calcium-vitamin D (OSCAL WITH D) 500-200 MG-UNIT per tablet 1 tablet  1 tablet Oral Daily Nishant Dhungel, MD   1 tablet at 09/27/11 1003  . carvedilol (COREG) tablet 3.125 mg  3.125 mg Oral BID WC Nishant Dhungel, MD   3.125 mg at 09/28/11 1303  . cinacalcet (SENSIPAR) tablet 60 mg  60 mg Oral BID Nishant Dhungel, MD   60 mg at 09/27/11 2221  . famotidine (PEPCID) tablet 20 mg  20 mg Oral BID Nishant Dhungel, MD   20 mg at 09/27/11 2221  . feeding supplement (NEPRO CARB STEADY) liquid 237 mL  237 mL Oral PRN Ulice Dash K. Posey Pronto, MD      . gabapentin (NEURONTIN) capsule 300 mg  300 mg Oral QHS PRN Louellen Molder, MD  300 mg at 09/26/11 2140  . heparin injection 2,400 Units  20 Units/kg Dialysis PRN Ramiro Harvest, PA      . HYDROcodone-acetaminophen (NORCO) 10-325 MG per tablet 2 tablet  2 tablet Oral Q6H PRN Nishant Dhungel, MD   1 tablet at 09/27/11 1002  . insulin aspart (novoLOG) injection 0-15 Units  0-15 Units Subcutaneous TID WC Nishant Dhungel, MD   2 Units at 09/27/11 1250  . multivitamin (RENA-VIT) tablet 1 tablet  1 tablet Oral Daily Nishant Dhungel, MD   1 tablet at 09/27/11 1003  . omega-3 acid ethyl esters (LOVAZA) capsule 1 g  1 g Oral Daily Nishant Dhungel, MD   1 g at 09/27/11 1003  . vancomycin  (VANCOCIN) 2,000 mg in sodium chloride 0.9 % 500 mL IVPB  2,000 mg Intravenous NOW [facility], [facility]   2,000 mg at 09/27/11 1405  . vancomycin (VANCOCIN) IVPB 1000 mg/200 mL premix  1,000 mg Intravenous Q T,Th,Sa-HD Margot Chimes [place], [place]      . DISCONTD: 0.9 %  sodium chloride infusion  100 mL Intravenous PRN Ulice Dash K. Posey Pronto, MD      . DISCONTD: 0.9 %  sodium chloride infusion  100 mL Intravenous PRN Jay K. Posey Pronto, MD      . DISCONTD: 0.9 %  sodium chloride infusion   Intravenous Continuous Missy Sabins, MD 20 mL/hr at 09/28/11 0838 500 mL at 09/28/11 0838  . DISCONTD: butamben-tetracaine-benzocaine (CETACAINE) spray    PRN Missy Sabins, MD   2 spray at 09/28/11 1001  . DISCONTD: diphenhydrAMINE (BENADRYL) 50 MG/ML injection           . DISCONTD: fentaNYL NICU IV Syringe 50 mcg/mL    PRN Missy Sabins, MD   25 mcg at 09/28/11 1013  . DISCONTD: lidocaine (XYLOCAINE) 1 % injection 5 mL  5 mL Intradermal PRN Jay K. Posey Pronto, MD      . DISCONTD: lidocaine-prilocaine (EMLA) cream 1 application  1 application Topical PRN Ulice Dash K. Posey Pronto, MD      . DISCONTD: midazolam (VERSED) 10 MG/2ML injection    PRN Missy Sabins, MD   2 mg at 09/28/11 1013  . DISCONTD: pentafluoroprop-tetrafluoroeth (GEBAUERS) aerosol 1 application  1 application Topical PRN Jay K. Posey Pronto, MD        Allergies  Allergen Reactions  . Cephalexin Other (See Comments)    Tongue swelling  . Statins     REACTION: perceived myalgias    History   Social History  . Marital Status: Married    Spouse Name: N/A    Number of Children: N/A  . Years of Education: N/A   Occupational History  . disabled   . formerly Lawyer for [employer].    Social History Main Topics  . Smoking status: Former Research scientist (life sciences)  . Smokeless tobacco: Not on file   Comment: Quit smoking 2007 Smoked x 25 years 1/2 ppd.  . Alcohol Use: Not on file  . Drug Use: No  . Sexually Active: Not on file   Other Topics Concern  . Not on file    Social History Narrative  . No narrative on file    Family History  Problem Relation Age of Onset  . Heart disease Father   . Heart disease Sister     Review of Systems:  As stated in the HPI and otherwise negative.   BP 145/105  Pulse 92  Temp(Src) 98.6 F (37 C) (Oral)  Resp 18  Ht  6' (1.829 m)  Wt 261 lb 11 oz (118.7 kg)  BMI 35.49 kg/m2  SpO2 97%  Physical Examination: General: Well developed, well nourished, NAD HEENT: OP clear, mucus membranes moist SKIN: warm, dry. No rashes. Neuro: No focal deficits Musculoskeletal: Muscle strength 5/5 all ext Psychiatric: Mood and affect normal Neck: No JVD, no carotid bruits, no thyromegaly, no lymphadenopathy. Lungs:Clear bilaterally, no wheezes, rhonci, crackles Cardiovascular: Regular rate and rhythm. No murmurs, gallops or rubs. Abdomen:Soft. Bowel sounds present. Non-tender.  Extremities: No lower extremity edema.  Echo 12//27/12:  Left ventricle: The cavity size was mildly dilated. Wall thickness was increased increased in a pattern of mild to moderate LVH. Systolic function was normal. The estimated ejection fraction was 55%. Wall motion was normal; there were no regional wall motion abnormalities. Features are consistent with a pseudonormal left ventricular filling pattern, with concomitant abnormal relaxation and increased filling pressure (grade 2 diastolic dysfunction). - Left atrium: The atrium was mildly dilated. - Impressions: Technically limited study due to poor sound wave transmission. Impressions:  - Technically limited study due to poor sound wave Transmission.   Labs:  H/H 9.8/30 Troponin 0.64 to 0.42.   EKG: Sinus, T wave inversions anterolateral leads 09/26/11  Assessment and Plan:  1. NSTEMI:  Pt is known to have CAD with DES in the RCA 2008. He has had no further stent placement since then. He has been on ASA and Plavix as an outpatient. Now with mild troponin elevation in setting of  profound anemia from GI bleeding c/w type 2 NSTEMI. He has had no chest pain or SOB. No inpatient ischemic testing is needed. He can get an outpatient myoview to rule out ischemia. In regards to ASA and Plavix, would not restart in setting of GI bleeding. He has no firm indication for Plavix at this time as his last coronary stent was in 2008. Will repeat EKG today. Hopefully, holding his antiplatelet therapy will lower his chance of recurrent GI bleeding. No need to cycle cardiac markers at this point. We will follow.   2. HTN: Titrate Coreg for better control.

## 2011-09-28 NOTE — Consult Note (Signed)
[facility] [place] Consult Note  History of Present Illness:  The renal service to evaluate this patient for a clotted right forearm AV graft. The patient was recently admitted to the hospital with severe anemia. His hemoglobin on admission was approximately 3. He has received transfusions since admission. The source of bleeding is thought to be GI in origin. However, the workup is still in progress. The patient has previously had a right forearm AV graft which has been revised on numerous occasions and at this point is not a candidate for further revision.  He has also had a previous left basilic vein transposition fistula which failed. He currently is dialyzing via a right-sided catheter which was placed a few days ago by interventional radiology. Other chronic medical problems include diabetes, hyperlipidemia, dementia, peripheral neuropathy, coronary artery disease, congestive heart failure, history of GI AV malformation. All these problems are currently stable except for his recent anemia of unknown etiology suspected GI source. The patient states he is feeling better today but did not feel very good on hospital admission. He states they also has some suspicion that he could potentially have a pneumonia. He is currently on Vancomycin with dialysis.  Past Medical History  Diagnosis Date  . Hyperthyroidism   . GASTROENTERITIS, VIRAL 10/14/2009  . ONYCHOMYCOSIS, TOENAILS 12/26/2007  . GOITER, MULTINODULAR 12/26/2007  . HYPERTHYROIDISM 02/02/2010  . DIABETES MELLITUS, TYPE II 02/01/2010  . DYSLIPIDEMIA 06/18/2007  . GOUT 06/18/2007  . Hypocalcemia 06/07/2010  . DEMENTIA 09/02/2009  . DEPRESSION 10/14/2009  . PERIPHERAL NEUROPATHY 06/18/2007  . HYPERTENSION 06/18/2007  . CAD, NATIVE VESSEL 02/06/2009  . CONGESTIVE HEART FAILURE 06/18/2007  . DIASTOLIC HEART FAILURE, CHRONIC 02/06/2009  . Unspecified hypotension 01/30/2010  . PULMONARY NODULE, RIGHT LOWER LOBE 06/08/2009  . GERD 06/18/2007  .  ESRD 08/04/2010  . BENIGN PROSTATIC HYPERTROPHY 10/14/2009  . GYNECOMASTIA 07/17/2010  . NECK PAIN 07/31/2010  . FOOT PAIN 08/12/2008  . DIZZINESS 07/17/2010  . Other malaise and fatigue 11/24/2009  . GAIT DISTURBANCE 03/03/2010  . DYSPNEA 10/29/2008  . CHEST PAIN 03/29/2010  . TRANSAMINASES, SERUM, ELEVATED 02/01/2010  . COLONIC POLYPS, HX OF 10/14/2009  . Anemia 06/16/2011  . Ischemic cardiomyopathy 06/16/2011  . Hyperparathyroidism, secondary 06/16/2011    Past Surgical History  Procedure Date  . Stent 06/11/08   . Neck sugury 2/09  . Back surgury 1998     Social History History  Substance Use Topics  . Smoking status: Former Research scientist (life sciences)  . Smokeless tobacco: Not on file   Comment: Quit smoking 2007 Smoked x 25 years 1/2 ppd.  . Alcohol Use: Not on file    Family History Family History  Problem Relation Age of Onset  . Heart disease Father   . Heart disease Sister     Allergies  Allergies  Allergen Reactions  . Cephalexin Other (See Comments)    Tongue swelling  . Statins     REACTION: perceived myalgias     Current Facility-Administered Medications  Medication Dose Route Frequency Provider Last Rate Last Dose  . 0.9 %  sodium chloride infusion   Intravenous STAT Leota Jacobsen, MD      . allopurinol (ZYLOPRIM) tablet 100 mg  100 mg Oral Daily Nishant Dhungel, MD   100 mg at 09/27/11 1003  . amitriptyline (ELAVIL) tablet 50 mg  50 mg Oral Daily PRN Nishant Dhungel, MD      . calcium-vitamin D (OSCAL WITH D) 500-200 MG-UNIT per tablet 1 tablet  1 tablet Oral Daily Nishant Dhungel, MD   1 tablet at 09/27/11 1003  . carvedilol (COREG) tablet 3.125 mg  3.125 mg Oral BID WC Nishant Dhungel, MD   3.125 mg at 09/28/11 1303  . cinacalcet (SENSIPAR) tablet 60 mg  60 mg Oral BID Nishant Dhungel, MD   60 mg at 09/27/11 2221  . famotidine (PEPCID) tablet 20 mg  20 mg Oral BID Nishant Dhungel, MD   20 mg at 09/27/11 2221  . feeding supplement (NEPRO CARB STEADY) liquid 237 mL  237 mL  Oral PRN Ulice Dash K. Posey Pronto, MD      . gabapentin (NEURONTIN) capsule 300 mg  300 mg Oral QHS PRN Nishant Dhungel, MD   300 mg at 09/26/11 2140  . heparin injection 2,400 Units  20 Units/kg Dialysis PRN Ramiro Harvest, PA      . HYDROcodone-acetaminophen (NORCO) 10-325 MG per tablet 2 tablet  2 tablet Oral Q6H PRN Nishant Dhungel, MD   1 tablet at 09/27/11 1002  . insulin aspart (novoLOG) injection 0-15 Units  0-15 Units Subcutaneous TID WC Nishant Dhungel, MD   2 Units at 09/27/11 1250  . multivitamin (RENA-VIT) tablet 1 tablet  1 tablet Oral Daily Nishant Dhungel, MD   1 tablet at 09/27/11 1003  . omega-3 acid ethyl esters (LOVAZA) capsule 1 g  1 g Oral Daily Nishant Dhungel, MD   1 g at 09/27/11 1003  . vancomycin (VANCOCIN) 2,000 mg in sodium chloride 0.9 % 500 mL IVPB  2,000 mg Intravenous NOW [facility], [facility]   2,000 mg at 09/27/11 1405  . vancomycin (VANCOCIN) IVPB 1000 mg/200 mL premix  1,000 mg Intravenous Q T,Th,Sa-HD Margot Chimes [place], [place]      . DISCONTD: 0.9 %  sodium chloride infusion  100 mL Intravenous PRN Ulice Dash K. Posey Pronto, MD      . DISCONTD: 0.9 %  sodium chloride infusion  100 mL Intravenous PRN Jay K. Posey Pronto, MD      . DISCONTD: 0.9 %  sodium chloride infusion   Intravenous Continuous Missy Sabins, MD 20 mL/hr at 09/28/11 0838 500 mL at 09/28/11 0838  . DISCONTD: butamben-tetracaine-benzocaine (CETACAINE) spray    PRN Missy Sabins, MD   2 spray at 09/28/11 1001  . DISCONTD: diphenhydrAMINE (BENADRYL) 50 MG/ML injection           . DISCONTD: fentaNYL NICU IV Syringe 50 mcg/mL    PRN Missy Sabins, MD   25 mcg at 09/28/11 1013  . DISCONTD: lidocaine (XYLOCAINE) 1 % injection 5 mL  5 mL Intradermal PRN Jay K. Posey Pronto, MD      . DISCONTD: lidocaine-prilocaine (EMLA) cream 1 application  1 application Topical PRN Ulice Dash K. Posey Pronto, MD      . DISCONTD: midazolam (VERSED) 10 MG/2ML injection    PRN Missy Sabins, MD   2 mg at 09/28/11 1013  . DISCONTD:  pentafluoroprop-tetrafluoroeth (GEBAUERS) aerosol 1 application  1 application Topical PRN Jay K. Posey Pronto, MD        ROS:   General:  No weight loss, Fever, chills  HEENT: No recent headaches, no nasal bleeding, no visual changes, no sore throat, +nasal congestion  Neurologic: No dizziness, blackouts, seizures. No recent symptoms of stroke or mini- stroke. No recent episodes of slurred speech, or temporary blindness.Some memory lapses  Cardiac: No recent episodes of chest pain/pressure, no shortness of breath at rest.  + shortness of breath with exertion.  Denies history of atrial fibrillation or  irregular heartbeat  Pulmonary: No home oxygen, does wear CPAP, + no productive cough, no hemoptysis,  No asthma or wheezing  Musculoskeletal:  [ ]  Arthritis, [ ]  Low back pain,  [ ]  Joint pain  Hematologic:No history of hypercoagulable state.  No history of easy bleeding.  + history of anemia with similar prior GI Bleeding several months ago  Gastrointestinal: + melena  Urinary: [x ] chronic Kidney disease, [ ]  on HD - [ ]  MWF or [ ]  TTHS, [ ]  Burning with urination, [ ]  Frequent urination, [ ]  Difficulty urinating;   Skin: No rashes  Psychological: No history of anxiety,  No history of depression   Physical Examination  Filed Vitals:   09/28/11 1020 09/28/11 1031 09/28/11 1034 09/28/11 1047  BP: 181/98 153/75 149/66 149/66  Pulse:      Temp:      TempSrc:      Resp: 62 15 13 16   Height:      Weight:      SpO2: 96% 96% 94% 95%    Body mass index is 35.49 kg/(m^2).  General:  Alert and oriented, no acute distress HEENT: Normal Neck: No bruit or JVD, right side catheter Pulmonary: Clear to auscultation bilaterally Cardiac: Regular Rate and Rhythm without murmur Gastrointestinal: Soft, non-tender, non-distended, no mass Skin: No rash Extremity Pulses:  2+ radial, brachial pulses bilaterally, multiple bilat scars from prior access, all thrombosed Musculoskeletal: No deformity  or edema  Neurologic: Upper and lower extremity motor 5/5 and symmetric  CBC    Component Value Date/Time   WBC 7.7 09/28/2011 0620   RBC 3.32* 09/28/2011 0620   HGB 9.8* 09/28/2011 0620   HCT 30.0* 09/28/2011 0620   PLT 183 09/28/2011 0620   MCV 90.4 09/28/2011 0620   MCH 29.5 09/28/2011 0620   MCHC 32.7 09/28/2011 0620   RDW 18.9* 09/28/2011 0620   LYMPHSABS 1.4 09/27/2011 0710   MONOABS 1.3* 09/27/2011 0710   EOSABS 0.3 09/27/2011 0710   BASOSABS 0.0 09/27/2011 0710     ASSESSMENT: Needs new access next step would be to place a right upper arm AVGG.  Recent central venogram on 12/21 showed patent central veins   PLAN:Pt needs to have other medical issues resolved such as his anemia work-up which currently is incomplete, resolution of his pneumonia if this is ongoing, and off antibiotics as the risk of graft infection would be too high to place during ongoing infection.  We would be happy to schedule his right upper arm graft when the above issues are resolved but this is completely elective and he needs to be medically stable prior to proceeding. Will follow.  Plan discussed with his patient and wife.  Ruta Hinds, MD [facility] [place] Office: [phone] Pager: [phone]   Ruta Hinds, MD [facility] [place]: [phone] Pager: [phone]

## 2011-09-28 NOTE — Progress Notes (Deleted)
  Echo March 2012  Study Conclusions    - Left ventricle: The cavity size was normal. Wall thickness was     increased in a pattern of mild LVH. Systolic function was normal.     The estimated ejection fraction was in the range of 50% to 55%.     Wall motion was normal; there were no regional wall motion     abnormalities. Features are consistent with a pseudonormal left     ventricular filling pattern, with concomitant abnormal relaxation     and increased filling pressure (grade 2 diastolic dysfunction).   - Left atrium: The atrium was mildly dilated.   Left heart cath Sept. 2008 FINDINGS:  LV was not done.  LVEDP was 16 mmHg.  Left main was patent.   LAD has 50-60% proximal stenosis.  Diagonal-I and diagonal-II were small   which were patent.  Left circumflex has 30-40% proximal stenosis.  High   OM-1 is moderate size, adjust 20-25% stenosis.  OM-2 has 20-25% proximal   stenosis.  RCA has 70-75% proximal stenosis.  PDA is patent.  PLV   branches are very small.  The patient tolerated procedure well.  There   were no complications.  The patient was transferred to recovery room in   stable condition.    Staged PCI 1. Successful percutaneous transluminal coronary angioplasty to       proximal right coronary artery using initially 2 x 8-mm-long       Voyager balloon and then 3 x 8-mm-long Voyager balloon.   2. Successful deployment of 3.5 x 16-mm-long Taxus drug-eluting stent       in proximal right coronary artery.

## 2011-09-28 NOTE — Op Note (Signed)
[facility] [place], [place]  [id number]  ENDOSCOPY PROCEDURE REPORT  PATIENT:  [name], [name]  MR#:  [id number] BIRTHDATE:  02/02/1951, 60 yrs. old  GENDER:  male  ENDOSCOPIST:  Teena Irani Referred by:  PROCEDURE DATE:  09/28/2011 PROCEDURE: ASA CLASS: INDICATIONS: Anemia and heme positive stools  MEDICATIONS: 75 mcg fentanyl, 6 kg Versed TOPICAL ANESTHETIC:  DESCRIPTION OF PROCEDURE:   After the risks benefits and alternatives of the procedure were thoroughly explained, informed consent was obtained.  The [id number] endoscope was introduced through the mouth and advanced to the , without limitations.  The instrument was slowly withdrawn as the mucosa was fully examined. <<PROCEDUREIMAGES>>  FINDINGS: Normal study  COMPLICATIONS: None  ENDOSCOPIC IMPRESSION:  Normal study  RECOMMENDATIONS: If patient has not had one will obtain abdominal CT scan to rule out hemobilia. If he has will repeat capsule endoscopy to  REPEAT EXAM:  ______________________________ Teena Irani  CC:  n. eSIGNED:   Teena Irani at 09/28/2011 10:29 AM  Sabino Dick, [id number]

## 2011-09-28 NOTE — Progress Notes (Signed)
Subjective: Patient was at [facility] in the interventional radiology department having a diatek catheter placed for hemodialysis access due to a clotted right AV graft. Routine blood work demonstrated hemoglobin 3.3. [facility] evaluation. He reported to the admitting physician that he had been having melanotic stools for 4 weeks. They returned over the past 4 days and have been associated with excessive fatigue. No other GI symptoms were noted. Past medical history significant for inpatient evaluation in August 2012 for melena. That time he underwent EGD, colonoscopy, and capsule enteroscopy with normal results. Consideration was given for possible small bowel AVMs-during that admission and he had a single duodenal AVM cauterized. He was also found to have colonic polyps and diverticula which were not bleeding. He is chronically on aspirin and Plavix after placement of drug-eluting stent in 2008.  Today he continues to endorse  fatigue. He denies chest pain or shortness of breath. He has not had a bowel movement since admission so unclear if melena persists.  Objective: Blood pressure 145/105, pulse 92, temperature 98.6 F (37 C), temperature source Oral, resp. rate 18, height 6' (1.829 m), weight 118.7 kg (261 lb 11 oz), SpO2 97.00%.  Intake/Output from previous day: 12/27 0701 - 12/28 0700 In: 980 [P.O.:480; IV Piggyback:500] Out: 0  Intake/Output this shift:    General appearance: alert, cooperative, appears stated age and mild distress Resp: clear to auscultation bilaterally Cardio: regular rate and rhythm, S1, S2 normal, no murmur, click, rub or gallop GI: soft, non-tender; bowel sounds normal; no masses,  no organomegaly Extremities: extremities normal, atraumatic, no cyanosis, 1+ diffuse edema Neurologic: Grossly normal  Lab Results:  [facility] 09/28/11 0620 09/27/11 0710  WBC 7.7 9.4  HGB 9.8* 11.1*  HCT  30.0* 33.0*  PLT 183 226   BMET  Basename 09/28/11 0620 09/27/11 0710  NA 139 141  K 5.3* 4.7  CL 101 100  CO2 23 26  GLUCOSE 102* 98  BUN 57* 40*  CREATININE 11.60* 8.72*  CALCIUM 8.2* 9.0    Studies/Results: Assessment/Plan:  Principal Problem:  *Anemia associated with acute blood loss on anemia of chronic disease Hemoglobin has increased from 3.7-11.1 after 5 units of packed red blood cells since admission. The fecal occult blood has been positive. Repeat CBC in the morning. Continue Procrit  Active Problems:  GI bleed presumed secondary to AVM (arteriovenous malformation) Appreciate gastroenterology assistance. Dr. Amedeo Plenty is concerned that the source may be proximal to the colon as previously suggested on prior EGD i.e. the solitary small bowel AVM. repeat endoscopy on 09/28/2011 non diagnostic repeat  capsule endoscopy next week   DIABETES MELLITUS, TYPE II CBGs have remained stable since admission. Continue sliding scale insulin. Based on medication reconciliation list patient was diet controlled and not on medications prior to admission.   DYSLIPIDEMIA Was on WelChol and omega-3 fatty acids at home.   NSTEMI/CAD, NATIVE VESSEL  Because of recurrent GI bleeding aspirin and Plavix are on hold. If source is not fully clarified given the significant anemia he presented with it is likely we may need to hold one or both of these medications permanently. Appreciate cardiology input   DIASTOLIC HEART FAILURE, CHRONIC/known ischemic cardiomyopathy  Review of old echo from 2010 demonstrated preserved LV function with grade 1 diastolic dysfunction. Clinically there is no evidence of exacerbation of heart failure even in the setting of profound anemia. We'll continue to follow   ESRD/Hyperparathyroidism, secondary Last dialysis was due  on 09/24/2011 but was missed because of clotted access in right upper arm AV graft. Normal dialysis days are Tuesday Thursday and Saturday.  Nephrology following the patient while here.    GOITER, MULTINODULAR/hyperthyroidism Currently at home was not taking medications for either hypothyroidism or hyperthyroidism. If he develops symptoms suggestive of either problem would consider checking a TSH this admission.   GOUT On Zyloprim at home.   BENIGN PROSTATIC HYPERTROPHY Asymptomatic   Disposition Transfer to a telemetry unit   LOS: 2 days  Vashon Arch    09/28/2011, 6:07 PM

## 2011-09-28 NOTE — Progress Notes (Signed)
Subjective:  No new complaints, but still weak, no recent black stools  Objective: Vital signs in last 24 hours: Temp:  [97.8 F (36.6 C)-99 F (37.2 C)] 99 F (37.2 C) (12/28 0538) Pulse Rate:  [81-92] 81  (12/28 0837) Resp:  [13-62] 16  (12/28 1047) BP: (133-181)/(66-98) 149/66 mmHg (12/28 1047) SpO2:  [92 %-99 %] 95 % (12/28 1047) Weight:  [117.6 kg (259 lb 4.2 oz)-118.7 kg (261 lb 11 oz)] 261 lb 11 oz (118.7 kg) (12/28 0538) Weight change: -3.2 kg (-7 lb 0.9 oz)  Intake/Output from previous day: 12/27 0701 - 12/28 0700 In: 980 [P.O.:480; IV Piggyback:500] Out: 0    EXAM: General appearance:  Alert, comfortable, no distress   Resp:  CTA bilaterally, on O2 per [place] Cardio:  RRR without murmur GI:  + BS, soft and nontender Extremities:  No edema; thrombosed right arm avg Access:  Right IJ catheter  Lab Results:  Basename 09/28/11 0620 09/27/11 0710  WBC 7.7 9.4  HGB 9.8* 11.1*  HCT 30.0* 33.0*  PLT 183 226   BMET:  Basename 09/28/11 0620 09/27/11 0710  NA 139 141  K 5.3* 4.7  CL 101 100  CO2 23 26  GLUCOSE 102* 98  BUN 57* 40*  CREATININE 11.60* 8.72*  CALCIUM 8.2* 9.0  ALBUMIN 2.8* --   No results found for this basename: PTH:2 in the last 72 hours Iron Studies: No results found for this basename: IRON,TIBC,TRANSFERRIN,FERRITIN in the last 72 hours  Assessment/Plan: 1.  Anemia - Hgb 9.8 today, 11.1 yesterday s/p 5 units of blood for Hgb of 3.7, secondary to recurrent GI bleeding, source believed to be proximal to colon per Dr. Amedeo Plenty, endoscopy this AM.  Result pending. Transfuse on HD tomorrow if HB drops to 8 or less) 2.  ESRD - on HD on TTS, K slightly high @ 5.3, but stable with next HD on regularly scheduled day tomorrow. No heparin. 3.  HTN/Volume - BP slightly high on Carvedilol, volume stable. 4.  Metabolic bone disease - on calcium carbonate with meals and Sensipar. 5.  Nutrition - Alb 4.3, stable. 6.  Low grade fever - CT yesterday indicated patchy  diffuse right-side pneumonia, but also concern for new catheter placed on 12/26, on vancomycin empirically with blood cultures pending. Lower tmax (99) 7.  Dialysis access - Shuntogram of RUA AVG last week per Dr. Bridgett Larsson, but clotted on 12/24; R IJ catheter placed on 12/26.  Will be seen by [facility] possibly today.(Seen by [facility] - they recommend new right upper arm AVG when current medical issues (gi bleed, question of PNA and fevers are resolved - electively) 8.  Positive cardiac enzymes - ? Demand ischemia. Trop 0.64 to 0.4    LOS: 2 days   [name],[name] 09/28/2011,12:13 PM  I have seen and examined this patient and agree with plan with highlighted changes/additions as above. Lidwina Kaner B,MD 09/28/2011 4:30 PM

## 2011-09-29 ENCOUNTER — Encounter (HOSPITAL_COMMUNITY)
Admission: EM | Disposition: A | Discharge status: Home / Self Care | Payer: Self-pay | Source: Home / Self Care | Attending: Internal Medicine

## 2011-09-29 ENCOUNTER — Inpatient Hospital Stay (HOSPITAL_COMMUNITY): Payer: Medicare HMO

## 2011-09-29 LAB — RENAL FUNCTION PANEL
BUN: 78 mg/dL — ABNORMAL HIGH (ref 6–23)
CO2: 24 mEq/L (ref 19–32)
Chloride: 101 mEq/L (ref 96–112)
Glucose, Bld: 105 mg/dL — ABNORMAL HIGH (ref 70–99)
Phosphorus: 7 mg/dL — ABNORMAL HIGH (ref 2.3–4.6)
Potassium: 5.1 mEq/L (ref 3.5–5.1)

## 2011-09-29 LAB — GLUCOSE, CAPILLARY
Glucose-Capillary: 125 mg/dL — ABNORMAL HIGH (ref 70–99)
Glucose-Capillary: 90 mg/dL (ref 70–99)
Glucose-Capillary: 119 mg/dL — ABNORMAL HIGH (ref 70–99)
Glucose-Capillary: 116 mg/dL — ABNORMAL HIGH (ref 70–99)

## 2011-09-29 LAB — CBC
HCT: 29.6 % — ABNORMAL LOW (ref 39.0–52.0)
Hemoglobin: 9.7 g/dL — ABNORMAL LOW (ref 13.0–17.0)
MCHC: 32.8 g/dL (ref 30.0–36.0)
RBC: 3.24 MIL/uL — ABNORMAL LOW (ref 4.22–5.81)

## 2011-09-29 SURGERY — IMAGING PROCEDURE, GI TRACT, INTRALUMINAL, VIA CAPSULE
Laterality: Left

## 2011-09-29 MED ORDER — SALINE SPRAY 0.65 % NA SOLN
1.0000 | NASAL | Status: DC | PRN
  Filled 2011-09-29: qty 44

## 2011-09-29 MED ORDER — CALCIUM CARBONATE-VITAMIN D 500-200 MG-UNIT PO TABS
3.0000 | ORAL_TABLET | Freq: Three times a day (TID) | ORAL | Status: DC
  Administered 2011-09-29 – 2011-10-01 (×5): 3 via ORAL
  Filled 2011-09-29 (×9): qty 3

## 2011-09-29 NOTE — Progress Notes (Signed)
[name] 1:22 PM  Subjective: The patient does not have any complaints and no obvious signs of bleeding and wants to go home. Unfortunately he refuses capsule today due to dialysis but hopefully will allow it tomorrow and his case was discussed with the hospital Dr. as well  Objective: Vital signs stable afebrile no acute distress abdomen is soft nontender hemoglobin stable  Assessment: Subacute GI bleeding in a patient on aspirin and Plavix  Plan: Will retry capsule endoscopy tomorrow and agree with aspirin only for the future and may go home from our standpoint after capsule endoscopy unless other question or problem  Ladana Chavero E

## 2011-09-29 NOTE — Progress Notes (Signed)
Patient ID: [name], male   DOB: May 26, 1951, 60 y.o.   MRN: [id number]   Hemoglobin improved, vitals stable.  No new recommendations (see Dr. Camillia Herter consult note).  If GI deems safe, would eventually like to restart ASA 81 but leave off Plavix.  We will check on him on Monday unless called.   Loralie Champagne 09/29/2011 7:04 AM

## 2011-09-29 NOTE — Progress Notes (Addendum)
Subjective: Interval History:   Objective: I personally attended this patient's dialysis session.  Access is permcath blood flow rate 400. Blood pressure at present is 167/102.  UF so far 2064 ml  mls with BP as noted.  Patient says feels "OK" not really SOB, mild non-productive cough Neck a little sore from PC placement Afebrile now  Vital signs in last 24 hours:  Temp:  [96.9 F (36.1 C)-98.6 F (37 C)] 96.9 F (36.1 C) (12/29 0735) Pulse Rate:  [80-92] 80  (12/29 0900) Resp:  [13-62] 15  (12/29 0830) BP: (104-187)/(56-108) 184/77 mmHg (12/29 0900) SpO2:  [94 %-97 %] 94 % (12/29 0735) Weight:  [117.6 kg (259 lb 4.2 oz)-118.1 kg (260 lb 5.8 oz)] 259 lb 4.2 oz (117.6 kg) (12/29 0735) Weight change: 0.5 kg (1 lb 1.6 oz)  Intake/Output: I/O last 3 completed shifts: In: 600 [P.O.:600] Out: 0   Intake/Output this shift:       . allopurinol  100 mg Oral Daily  . calcium-vitamin D  1 tablet Oral Daily  . carvedilol  6.25 mg Oral BID WC  . cinacalcet  60 mg Oral BID  . famotidine  20 mg Oral Daily  . insulin aspart  0-15 Units Subcutaneous TID WC  . multivitamin  1 tablet Oral Daily  . omega-3 acid ethyl esters  1 g Oral Daily  . DISCONTD: carvedilol  3.125 mg Oral BID WC  . DISCONTD: famotidine  20 mg Oral BID  . DISCONTD: vancomycin  1,000 mg Intravenous Q T,Th,Sa-HD   EXAM: CVS-Regular S1S2 no S3 RS- Lungs clear ABD-soft, NT EXT- No edema; thrombosed R AVG Permcath in right IJ position with dsg intact  Lab Results:  Basename 09/29/11 0755 09/28/11 0620 09/27/11 0710  WBC 7.5 7.7 9.4  HGB 9.7* 9.8* 11.1*  HCT 29.6* 30.0* 33.0*  PLT 204 183 226   BMET  Basename 09/29/11 0755 09/28/11 0620 09/27/11 0710  NA 141 139 141  K 5.1 5.3* 4.7  CL 101 101 100  CO2 24 23 26   GLUCOSE 105* 102* 98  BUN 78* 57* 40*  CREATININE 13.77* 11.60* 8.72*  CALCIUM 8.6 8.2* 9.0  PHOS 7.0* 5.5* --   LFT  Basename 09/29/11 0755  PROT --  ALBUMIN 2.9*  AST --  ALT --    ALKPHOS --  BILITOT --  BILIDIR --  IBILI --   PT/INR  Basename 09/26/11 1208  LABPROT 12.6  INR 0.92   Hepatitis Panel No results found for this basename: HEPBSAG,HCVAB,HEPAIGM,HEPBIGM in the last 72 hours PTH: Lab Results  Component Value Date   CALCIUM 8.6 09/29/2011   CAION 1.10* 09/20/2011   PHOS 7.0* 09/29/2011   .lastc  Studies/Results: No results found.  Assessment/Plan 1. Anemia d/t gi bleed with initial Hb 3's; 9.8 yesterday, 9.7 today - stabilized with no further melena.  Still to undergo capsule endo for localization of GI bleed; (s/p 5 units PRBC's) Cards wishes pt to go back on ASA but no Plavix if OK with GI 2. ESRD - on HD on TTS, HD today; no heparin. 3. HTN/Volume - BP slightly high on Carvedilol, volume stable.  4. Metabolic bone disease - on calcium carbonate with meals and Sensipar.Not getting usual binder dose which is 3 TID w/ meals; will adjust  5. Nutrition - Alb 4.3, stable.  6. Low grade fever - CT  indicated patchy diffuse right-side pneumonia, but also concern for new catheter placed on 12/26, on vancomycin empirically with blood cultures  pending. Lower tmax (99) Primary service has d/c'd his vanco 7. Dialysis access - Shuntogram of RUA AVG last week per Dr. Bridgett Larsson, but clotted on 12/24; R IJ catheter placed on 12/26. Will be seen by [facility] possibly today.(Seen by [facility] - they recommend new right upper arm AVG when current medical issues (gi bleed,fevers etc are resolved - electively as outpatient)  8. Positive cardiac enzymes - ? Demand ischemia. Trop 0.64 to 0.4 .  See cardiology note. 9.  Dispo - ? Home soon (pt did not take capsule for the capsule endo this am and was told will "do it another day" - not sure when planned; but with stable HB, no fever, and AVG planned for outpt would think home soon     LOS: 3 Aashir Umholtz B @TODAY @9 :21 AM

## 2011-09-29 NOTE — Progress Notes (Signed)
Subjective: Patient was at [facility] in the interventional radiology department having a diatek catheter placed for hemodialysis access due to a clotted right AV graft. Routine blood work demonstrated hemoglobin 3.3. [facility] evaluation. He reported to the admitting physician that he had been having melanotic stools for 4 weeks. They returned over the past 4 days and have been associated with excessive fatigue. No other GI symptoms were noted. Past medical history significant for inpatient evaluation in August 2012 for melena. That time he underwent EGD, colonoscopy, and capsule enteroscopy with normal results. Consideration was given for possible small bowel AVMs-during that admission and he had a single duodenal AVM cauterized. He was also found to have colonic polyps and diverticula which were not bleeding. He is chronically on aspirin and Plavix after placement of drug-eluting stent in 2008.  Feels OK.  Objective: Blood pressure 187/108, pulse 81, temperature 96.9 F (36.1 C), temperature source Oral, resp. rate 15, height 6' (1.829 m), weight 117.6 kg (259 lb 4.2 oz), SpO2 94.00%. Alert and oriented  Cvs: rrr Rs: ctab Abdomen : soft, nt Intake/Output from previous day: 12/28 0701 - 12/29 0700 In: 320 [P.O.:320] Out: 0  Intake/Output this shift:     Lab Results:  Basename 09/29/11 0755 09/28/11 0620  WBC 7.5 7.7  HGB 9.7* 9.8*  HCT 29.6* 30.0*  PLT 204 183   BMET  Basename 09/29/11 0755 09/28/11 0620  NA 141 139  K 5.1 5.3*  CL 101 101  CO2 24 23  GLUCOSE 105* 102*  BUN 78* 57*  CREATININE 13.77* 11.60*  CALCIUM 8.6 8.2*    Studies/Results: Assessment/Plan:  Principal Problem:  *Anemia associated with acute blood loss on anemia of chronic disease Hemoglobin has increased from 3.7-11.1 after 5 units of packed red blood cells since admission. The fecal occult blood has been positive. Repeat  CBC in the morning. Continue Procrit  Active Problems:  GI bleed presumed secondary to AVM (arteriovenous malformation) Appreciate gastroenterology assistance. Dr. Amedeo Plenty is concerned that the source may be proximal to the colon as previously suggested on prior EGD i.e. the solitary small bowel AVM. repeat endoscopy on 09/28/2011 non diagnostic repeat  capsule endoscopy next week   DIABETES MELLITUS, TYPE II CBGs have remained stable since admission. Continue sliding scale insulin. Based on medication reconciliation list patient was diet controlled and not on medications prior to admission.   DYSLIPIDEMIA Was on WelChol and omega-3 fatty acids at home.   NSTEMI/CAD, NATIVE VESSEL  Because of recurrent GI bleeding aspirin and Plavix are on hold. If source is not fully clarified given the significant anemia he presented with it is likely we may need to hold one or both of these medications permanently. Appreciate cardiology input   DIASTOLIC HEART FAILURE, CHRONIC/known ischemic cardiomyopathy  Review of old echo from 2010 demonstrated preserved LV function with grade 1 diastolic dysfunction. Clinically there is no evidence of exacerbation of heart failure even in the setting of profound anemia. We'll continue to follow   ESRD/Hyperparathyroidism, secondary Last dialysis was due on 09/24/2011 but was missed because of clotted access in right upper arm AV graft. Normal dialysis days are Tuesday Thursday and Saturday. Nephrology following the patient while here.    GOITER, MULTINODULAR/hyperthyroidism Currently at home was not taking medications for either hypothyroidism or hyperthyroidism. If he develops symptoms suggestive of either problem would consider checking a TSH this admission.   GOUT On Zyloprim at home.  BENIGN PROSTATIC HYPERTROPHY Asymptomatic   Disposition Transfer to a telemetry unit   LOS: 3 days  Porschia Willbanks    09/29/2011, 9:03 AM

## 2011-09-30 ENCOUNTER — Encounter (HOSPITAL_COMMUNITY)
Admission: EM | Disposition: A | Discharge status: Home / Self Care | Payer: Self-pay | Source: Home / Self Care | Attending: Internal Medicine

## 2011-09-30 HISTORY — PX: GIVENS CAPSULE STUDY: SHX5432

## 2011-09-30 LAB — GLUCOSE, CAPILLARY: Glucose-Capillary: 101 mg/dL — ABNORMAL HIGH (ref 70–99)

## 2011-09-30 SURGERY — IMAGING PROCEDURE, GI TRACT, INTRALUMINAL, VIA CAPSULE

## 2011-09-30 MED ORDER — CLONIDINE HCL 0.2 MG PO TABS
0.2000 mg | ORAL_TABLET | Freq: Once | ORAL | Status: AC
  Administered 2011-09-30: 0.2 mg via ORAL
  Filled 2011-09-30: qty 1

## 2011-09-30 MED ORDER — DARBEPOETIN ALFA-POLYSORBATE 40 MCG/0.4ML IJ SOLN
40.0000 ug | INTRAMUSCULAR | Status: DC
  Administered 2011-10-01: 40 ug via INTRAVENOUS
  Filled 2011-09-30: qty 0.4

## 2011-09-30 MED ORDER — HYDRALAZINE HCL 20 MG/ML IJ SOLN: 10.0000 mg | Freq: Four times a day (QID) | INTRAMUSCULAR | Status: DC | PRN

## 2011-09-30 NOTE — Progress Notes (Signed)
Subjective: Interval History:  Swallowed capsule for capsule endo about 7AM.  Says the camera will shyut off around 3 PM No more bleeding Objective:  Vital signs in last 24 hours:  Temp:  [97.7 F (36.5 C)-98.6 F (37 C)] 97.8 F (36.6 C) (12/30 0510) Pulse Rate:  [85-89] 85  (12/30 0510) Resp:  [18] 18  (12/30 0510) BP: (146-169)/(76-89) 146/85 mmHg (12/30 0510) SpO2:  [93 %-95 %] 93 % (12/30 0510) Weight:  [113.399 kg (250 lb)] 250 lb (113.399 kg) (12/30 0510) Weight change: -0.5 kg (-1 lb 1.6 oz)  Intake/Output: I/O last 3 completed shifts: In: 720 [P.O.:720] Out: 4000 [Other:4000]  Intake/Output this shift:     EXAM: CVS-S1S2 no S3 RS- Clear ABD-Obese, NT EXT- no edema Occluded right AVG; perm cath in place  Lab Results:  [facility] 09/29/11 0755 09/28/11 0620  WBC 7.5 7.7  HGB 9.7* 9.8*  HCT 29.6* 30.0*  PLT 204 183   BMET  Basename 09/29/11 0755 09/28/11 0620  NA 141 139  K 5.1 5.3*  CL 101 101  CO2 24 23  GLUCOSE 105* 102*  BUN 78* 57*  CREATININE 13.77* 11.60*  CALCIUM 8.6 8.2*  PHOS 7.0* 5.5*   LFT  Basename 09/29/11 0755  PROT --  ALBUMIN 2.9*  AST --  ALT --  ALKPHOS --  BILITOT --  BILIDIR --  IBILI --   PT/INR No results found for this basename: LABPROT:2,INR:2 in the last 72 hours Hepatitis Panel No results found for this basename: HEPBSAG,HCVAB,HEPAIGM,HEPBIGM in the last 72 hours PTH: Lab Results  Component Value Date   CALCIUM 8.6 09/29/2011   CAION 1.10* 09/20/2011   PHOS 7.0* 09/29/2011   Assessment/Plan: 1. Anemia d/t gi bleed with initial Hb 3's; Hb has stabilized in the 9's with no further melena. Undergoing capsule endo for localization of GI bleed; (s/p 5 units PRBC's) Cards wishes pt to go back on ASA but no Plavix if OK with GI and they say OK for ASA only. 2. ESRD - on HD on TTS, would be due tomorrow on holiday schedule 3. HTN/Volume - BP slightly high on Carvedilol, volume stable.  4. Metabolic bone disease -  on calcium carbonate with meals and Sensipar.Back on usual binder 5. Nutrition - Alb 4.3, stable.  6. Low grade fever - resolved; got 1 dose vanc 7. Dialysis access - Shuntogram of RUA AVG last week per Dr. Bridgett Larsson, but clotted on 12/24; R IJ catheter placed on 12/26.  Seen by [facility] - they recommend new right upper arm AVG electively as outpatient 8. Positive cardiac enzymes - ? Demand ischemia. Trop 0.64 to 0.4 . See cardiology note.  9. Dispo - Would be fine with me if D/c today after capsule endo.  He knows to go to HD at [facility] on holiday schedule; will continue heparin free HD; new permanent access per [facility] to be done electively as outpt.  PLEASE NOTIFY us IF HE IS NOT DISCHARGED SO THAT DIALYSIS HERE CAN BE ARRANGED.    LOS: 4 [name] @TODAY @12 :29 PM

## 2011-09-30 NOTE — Progress Notes (Signed)
Subjective: Patient was at [facility] in the interventional radiology department having a diatek catheter placed for hemodialysis access due to a clotted right AV graft. Routine blood work demonstrated hemoglobin 3.3. [facility]. He reported to the admitting physician that he had been having melanotic stools for 4 weeks. They returned over the past 4 days and have been associated with excessive fatigue. No other GI symptoms were noted. Past medical history significant for inpatient evaluation in August 2012 for melena. That time he underwent EGD, colonoscopy, and capsule enteroscopy with normal results. Consideration was given for possible small bowel AVMs-during that admission and he had a single duodenal AVM cauterized. He was also found to have colonic polyps and diverticula which were not bleeding. He is chronically on aspirin and Plavix after placement of drug-eluting stent in 2008.    Objective: Blood pressure 146/85, pulse 85, temperature 97.8 F (36.6 C), temperature source Oral, resp. rate 18, height 6' (1.829 m), weight 113.399 kg (250 lb), SpO2 93.00%.  Intake/Output from previous day: 12/29 0701 - 12/30 0700 In: 600 [P.O.:600] Out: 4000  Intake/Output this shift:     Lab Results:  Basename 09/29/11 0755 09/28/11 0620  WBC 7.5 7.7  HGB 9.7* 9.8*  HCT 29.6* 30.0*  PLT 204 183   BMET  Basename 09/29/11 0755 09/28/11 0620  NA 141 139  K 5.1 5.3*  CL 101 101  CO2 24 23  GLUCOSE 105* 102*  BUN 78* 57*  CREATININE 13.77* 11.60*  CALCIUM 8.6 8.2*    Studies/Results: Assessment/Plan:  Principal Problem:  *Anemia associated with acute blood loss on anemia of chronic kidney disease Hemoglobin has increased from 3.7-11.1 after 5 units of packed red blood cells since admission. The fecal occult blood has been positive. Repeat CBC in the morning. Continue Procrit  One episode of low grade  fever on 09/25/11 Had low grade fever after 5 units of prbcs but cultures were negative for bacterial growth. Received 2 doses of empiric vanco. Difficult to state what the incidental lung infiltrates on the abdominal Ct mean without any symptoms e.g. Cough, sputum, malaise, etc doing well off abx  Active Problems:  GI bleed presumed secondary to AVM (arteriovenous malformation) Appreciate gastroenterology assistance. Dr. Amedeo Plenty is concerned that the source may be proximal to the colon as previously suggested on prior EGD i.e. the solitary small bowel AVM. repeat endoscopy on 09/28/2011 non diagnostic   capsule endoscopy 12/30-12/31   DIABETES MELLITUS, TYPE II CBGs have remained stable since admission. Continue sliding scale insulin. Based on medication reconciliation list patient was diet controlled and not on medications prior to admission.   DYSLIPIDEMIA Was on WelChol and omega-3 fatty acids at home.   NSTEMI/CAD, NATIVE VESSEL  Because of recurrent GI bleeding aspirin and Plavix are on hold. If source is not fully clarified given the significant anemia he presented with it is likely we may need to hold one or both of these medications permanently. Appreciate cardiology input - will resume aspirin only if GI approves    DIASTOLIC HEART FAILURE, CHRONIC/known ischemic cardiomyopathy  Review of old echo from 2010 demonstrated preserved LV function with grade 1 diastolic dysfunction. Clinically there is no evidence of exacerbation of heart failure even in the setting of profound anemia. We'll continue to follow   ESRD/Hyperparathyroidism, secondary Last dialysis was due on 09/24/2011 but was missed because of clotted access in right upper arm AV graft. Normal dialysis days are  Tuesday Thursday and Saturday. Nephrology following the patient while here.    GOITER, MULTINODULAR/hyperthyroidism Currently at home was not taking medications for either hypothyroidism or hyperthyroidism. If he  develops symptoms suggestive of either problem would consider checking a TSH this admission.   GOUT On Zyloprim at home.   BENIGN PROSTATIC HYPERTROPHY Asymptomatic    LOS: 4 days  Sakari Raisanen    09/30/2011, 2:05 PM

## 2011-09-30 NOTE — Progress Notes (Signed)
[name] 4:00 PM  Subjective: The patient still feels a little weak but has no GI complaints and has undergone his capsule endoscopy without problem  Objective: Vital signs stable afebrile abdomen is soft nontender hemoglobin stable  Assessment: Subacute GI bleeding questionable etiology  Plan: Okay with me to go home Will review capsule either later today or have one of my partners review it  Tomorrow and he can followup with his primary gastroenterologist I believe that's Dr. Michail Sermon to decide any further workup and plan  [facility] E

## 2011-09-30 NOTE — Progress Notes (Signed)
PROCEDURE NOTE  09/26/2011 - 09/30/2011  6:13 PM  PATIENT:  [name]  60 y.o. male  PRE-OPERATIVE DIAGNOSIS:  Subacute GI bleeding in a patient with renal failure POST-OPERATIVE DIAGNOSIS: Same  PROCEDURE: Capsule endoscopy  SURGEON:  Surgeon(s): Jeryl Columbia, MD  ASSESSMENT/FINDINGS:  1. Fair study at best due to increase fluid and food 2. Probable 1 tiny gastric erosion seen 3. 1 tiny small bowel red spot questionable significance 4. Questionable small bowel edematous fold questionable significance 5. Otherwise within normal limits without signs of active bleeding  PLAN OF CARE: Will discuss findings with Dr. Michail Sermon the patient's primary gastroenterologist I think and consider repeat colonoscopy [facility] otherwise no further GI workup during this hospital stay and followup as above when convenient

## 2011-10-01 ENCOUNTER — Inpatient Hospital Stay (HOSPITAL_COMMUNITY): Payer: Medicare HMO

## 2011-10-01 DIAGNOSIS — I2510 Atherosclerotic heart disease of native coronary artery without angina pectoris: Secondary | ICD-10-CM

## 2011-10-01 LAB — CBC
Hemoglobin: 10 g/dL — ABNORMAL LOW (ref 13.0–17.0)
Platelets: 241 10*3/uL (ref 150–400)
RBC: 3.31 MIL/uL — ABNORMAL LOW (ref 4.22–5.81)
WBC: 7.9 10*3/uL (ref 4.0–10.5)

## 2011-10-01 LAB — RENAL FUNCTION PANEL
BUN: 69 mg/dL — ABNORMAL HIGH (ref 6–23)
CO2: 26 mEq/L (ref 19–32)
Calcium: 9.3 mg/dL (ref 8.4–10.5)
Chloride: 102 mEq/L (ref 96–112)
Creatinine, Ser: 13.01 mg/dL — ABNORMAL HIGH (ref 0.50–1.35)
Glucose, Bld: 124 mg/dL — ABNORMAL HIGH (ref 70–99)

## 2011-10-01 LAB — GLUCOSE, CAPILLARY: Glucose-Capillary: 108 mg/dL — ABNORMAL HIGH (ref 70–99)

## 2011-10-01 MED ORDER — ASPIRIN EC 81 MG PO TBEC
81.0000 mg | DELAYED_RELEASE_TABLET | Freq: Every day | ORAL | Status: DC
  Administered 2011-10-01: 81 mg via ORAL
  Filled 2011-10-01: qty 1

## 2011-10-01 MED ORDER — DARBEPOETIN ALFA-POLYSORBATE 40 MCG/0.4ML IJ SOLN
INTRAMUSCULAR | Status: AC
  Filled 2011-10-01: qty 0.4

## 2011-10-01 NOTE — Progress Notes (Signed)
NOTIFIED OF PT'S BP BEING ELEVATED AT 210/110. MANUAL RECHECK OF 186/108. [facility] NOTIFIED-RECEIVED NEW ORDERS. WILL CONT TO MONITOR. Lorre Nick

## 2011-10-01 NOTE — Progress Notes (Signed)
SUBJECTIVE: The patient is doing well today. No active bleeding.  At this time, he denies chest pain, shortness of breath, or any new concerns.     [name] allopurinol  100 mg Oral Daily  . calcium-vitamin D  3 tablet Oral TID WC  . carvedilol  6.25 mg Oral BID WC  . cinacalcet  60 mg Oral BID  . cloNIDine  0.2 mg Oral Once  . darbepoetin      . darbepoetin (ARANESP) injection - DIALYSIS  40 mcg Intravenous Q Mon-HD  . famotidine  20 mg Oral Daily  . insulin aspart  0-15 Units Subcutaneous TID WC  . multivitamin  1 tablet Oral Daily  . omega-3 acid ethyl esters  1 g Oral Daily      OBJECTIVE: Physical Exam: Filed Vitals:   10/01/11 0636 10/01/11 0700 10/01/11 0730 10/01/11 0800  BP: 175/93 154/93 168/86 132/87  Pulse: 71 67 69 69  Temp:      TempSrc:      Resp: 14 14 12 16   Height:      Weight:      SpO2:        Intake/Output Summary (Last 24 hours) at 10/01/11 [id number] Last data filed at 09/30/11 2228  Gross per 24 hour  Intake    480 ml  Output      0 ml  Net    480 ml    Telemetry reveals sinus rhythm  GEN- The patient is sleeping during HD session but arousable Head- normocephalic, atraumatic Eyes-  Sclera clear, conjunctiva pink Ears- hearing intact Oropharynx- clear Neck- supple, no JVP Lungs- Clear to ausculation bilaterally, normal work of breathing Heart- Regular rate and rhythm, decreased HS GI- soft, NT, ND, + BS Extremities- no clubbing, cyanosis,+ dependant edema  LABS: Basic Metabolic Panel:  Basename 10/01/11 0645 09/29/11 0755  NA 141 141  K 5.2* 5.1  CL 102 101  CO2 26 24  GLUCOSE 124* 105*  BUN 69* 78*  CREATININE 13.01* 13.77*  CALCIUM 9.3 8.6  MG -- --  PHOS 7.7* 7.0*   Liver Function Tests:  Basename 10/01/11 0645 09/29/11 0755  AST -- --  ALT -- --  ALKPHOS -- --  BILITOT -- --  PROT -- --  ALBUMIN 3.0* 2.9*   No results found for this basename: LIPASE:2,AMYLASE:2 in the last 72 hours CBC:  Basename 10/01/11 0500 09/29/11  0755  WBC 7.9 7.5  NEUTROABS -- --  HGB 10.0* 9.7*  HCT 30.8* 29.6*  MCV 93.1 91.4  PLT 241 204   Cardiac E ASSESSMENT AND PLAN:  CAD- stable without ischemic symptoms Agree with stopping plavix long term Restart ASA 81mg  daily when OK with GI.  No further inpatient CV recs. Will see as needed while here. Follow-up with Richardson Dopp in 3-4 weeks as an outpatient. Call with questions.    Thompson Grayer, MD 10/01/2011 8:24 AM

## 2011-10-01 NOTE — Progress Notes (Signed)
Pt's tele d/c. Pt and wife verbalizes understanding of discharge instructions_____________________________D. Owens Shark RN

## 2011-10-01 NOTE — Progress Notes (Signed)
I have personally attended this patient's dialysis session.  Access is [employer], blood flow rate 400. Blood pressure at present is 154/93.  HB is stable at 10 with no further GI bleeding.  Anticipate pt to be discharged today. Capsule endo showed poss 1 small gastric erosion, 1 tiny small bowel red spot, question small bowel edematous fold.  To be discussed with pt's primary GI but no further wu as inpt this trip.   [facility] to arrange elective new AVG placement right upper arm (outpt). Caedence Snowden B

## 2011-10-01 NOTE — Progress Notes (Signed)
   CARE MANAGEMENT NOTE 10/01/2011  Patient:  [name], [name]   Account Number:  [id number]  Date Initiated:  09/28/2011  Documentation initiated by:  Marvetta Gibbons  Subjective/Objective Assessment:   Pt admitted with anemia, hx HD     Action/Plan:   PTA pt lived at home with spouse, was independent with ADLs   Anticipated DC Date:  10/01/2011   Anticipated DC Plan:  [place]  CM consult      Choice offered to / List presented to:             Status of service:  Completed, signed off Medicare Important Message given?   (If response is "NO", the following Medicare IM given date fields will be blank) Date Medicare IM given:   Date Additional Medicare IM given:    Discharge Disposition:  HOME/SELF CARE  Per UR Regulation:    Comments:  PCP- [name]  10/01/11 16:28 Tomi Bamberger RN, BSN [phone] patient dc to home.  09/28/11- 1500- Marvetta Gibbons RN, BSN [phone] Pt from home, plan to return home when medically stable- no anticipated d/c needs

## 2011-10-02 NOTE — Discharge Summary (Signed)
Patient ID: [name] MRN: [id number] DOB/AGE: 63-Oct-1952 63 y.o. Primary Care Physician:Ruffin John, MD, MD Admit date: 09/26/2011 Discharge date: 10/02/2011    Discharge Diagnoses:   Principal Problem:  *Anemia associated with acute blood loss Active Problems:  GOITER, MULTINODULAR  HYPERTHYROIDISM  DIABETES MELLITUS, TYPE II  DYSLIPIDEMIA  GOUT  DEMENTIA  DEPRESSION  CAD, NATIVE VESSEL  DIASTOLIC HEART FAILURE, CHRONIC  ESRD  BENIGN PROSTATIC HYPERTROPHY  Other malaise and fatigue  Anemia  Ischemic cardiomyopathy  Hyperparathyroidism, secondary  AVM (arteriovenous malformation)  CAD (coronary artery disease)  NSTEMI (non-ST elevated myocardial infarction)   Medication List  As of 10/02/2011  5:20 PM   CONTINUE taking these medications         allopurinol 100 MG tablet   Commonly known as: ZYLOPRIM      amitriptyline 50 MG tablet   Commonly known as: ELAVIL      aspirin 81 MG EC tablet      calcium-vitamin D 500-200 MG-UNIT per tablet   Commonly known as: OSCAL WITH D      carvedilol 12.5 MG tablet   Commonly known as: COREG   TAKE 1 TABLET TWICE DAILY      cinacalcet 60 MG tablet   Commonly known as: SENSIPAR      Fish Oil 1000 MG Caps      gabapentin 300 MG capsule   Commonly known as: NEURONTIN      multivitamin Tabs tablet      ranitidine 300 MG capsule   Commonly known as: ZANTAC      WELCHOL 3.75 G Pack   Generic drug: Colesevelam HCl   MIX 1 PACKET IN WATER AND DRINK ONE TIME DAILY         STOP taking these medications         clopidogrel 75 MG tablet      epoetin alfa 3000 UNIT/ML injection            Discharged Condition: Good    Consults: [facility]  Significant Diagnostic Studies: Ct Abdomen Pelvis Wo Contrast  09/27/2011  *RADIOLOGY REPORT*  Clinical Data: Decreased hemoglobin; assess for retroperitoneal hematoma.  CT ABDOMEN AND PELVIS WITHOUT CONTRAST   Technique:  Multidetector CT imaging of the abdomen and pelvis was performed following the standard protocol without intravenous contrast.  Comparison: MRCP performed 12/30/2009, and abdominal ultrasound performed 12/28/2009  Findings: Relatively diffuse patchy airspace opacification is noted within the right lung, concerning for right-sided pneumonia.  Trace bilateral pleural effusions noted, with associated atelectasis. Diffuse coronary artery calcifications are seen.  Air is noted filling the intrahepatic biliary ducts; this reflects prior sphincterotomy.  The liver is otherwise unremarkable in appearance.  The spleen is within normal limits.  The patient is status post cholecystectomy, with clips noted at the gallbladder fossa.  The pancreas and adrenal glands are grossly unremarkable.  Bilateral renal atrophy is noted, with diffuse nonspecific bilateral perinephric stranding, and associated vascular calcifications at the renal hila.  There is no evidence of hydronephrosis.  No renal or ureteral stones are identified.  No free fluid is identified.  The small bowel is unremarkable in appearance.  The stomach is within normal limits.  No acute vascular abnormalities are seen.  Scattered calcification is noted along the abdominal aorta and its branches.  The appendix is normal in caliber and contains air, without evidence for appendicitis.  The colon is partially filled with stool and is unremarkable in appearance.  There  is no evidence for retroperitoneal hematoma.  No focal fluid collections are identified.  The bladder is mildly distended and grossly unremarkable in appearance.  The prostate remains normal in size.  No inguinal lymphadenopathy is seen.  No acute osseous abnormalities are identified.  The patient is status post lumbar spinal fusion and decompression at L3-L5, with associated vacuum phenomenon and disc space narrowing noted along the upper lumbar spine.  IMPRESSION:  1.  No evidence of  retroperitoneal hematoma. 2.  Patchy diffuse right-sided pneumonia noted.  3.  Trace bilateral pleural effusions, with associated atelectasis. 4.  Diffuse coronary artery calcifications seen. 5.  Air noted filling the intrahepatic biliary ducts, reflecting prior sphincterotomy. 6.  Bilateral renal atrophy. 7.  Scattered calcification along the abdominal aorta and its branches. 8.  Postoperative and degenerative changes noted along the lumbar spine.  Findings were discussed with Bella Kennedy RN on [id number] at 05:46 a.m. on 09/27/2011.  Original Report Authenticated By: Santa Lighter, M.D.   Ir Fluoro Guide Cv Line Right  09/26/2011  *RADIOLOGY REPORT*  Clinical data: Failed right shunt angioplasty.  Needs access for hemodialysis.  TUNNELED HEMODIALYSIS CATHETER PLACEMENT WITH ULTRASOUND AND FLUOROSCOPIC GUIDANCE:  Comparison: None  Technique: The procedure, risks, benefits, and alternatives were explained to the patient and spouse.  Questions regarding the procedure were encouraged and answered.  The patient understands and consents to the procedure.  As antibiotic prophylaxis, vancomycin 1 gram IV was ordered pre- procedure and administered intravenously within one hour of incision.  Patency of the right IJ vein was confirmed with ultrasound with image documentation. An appropriate skin site was determined. Region was prepped using maximum barrier technique including cap and mask, sterile gown, sterile gloves, large sterile sheet, and Chlorhexidine   as cutaneous antisepsis. The region was infiltrated locally with 1% lidocaine.  Intravenous Fentanyl and Versed were administered as conscious sedation during continuous cardiorespiratory monitoring by the radiology RN, with a total moderate sedation time of 14 minutes.  Under real-time ultrasound guidance, the right IJ vein was accessed with a 21 gauge micropuncture needle; the needle tip within the vein was confirmed with ultrasound image documentation.   Needle  exchanged over the 018 guidewire for transitional dilator, which allowed advancement of a Benson wire into the IVC. Over this, an MPA catheter was advanced. A Hemosplit 23 hemodialysis catheter was tunneled from the right anterior chest wall approach to the right IJ dermatotomy site. The MPA catheter was exchanged over an Amplatz wire for serial vascular dilators which allow placement of a peel- away sheath, through which the catheter was advanced under intermittent fluoroscopy, positioned with its tips in the proximal and midright atrium. Spot chest radiograph confirms good catheter position. No pneumothorax. Catheter was flushed and primed per protocol. Catheter secured externally with O Prolene sutures. The right IJ   dermatotomy site was closed   Dermabond. No immediate complication.  IMPRESSION:  1. Technically successful placement of tunneled right IJ hemodialysis catheter with ultrasound and fluoroscopic guidance. Ready for routine use.  Original Report Authenticated By: Trecia Rogers, M.D.   Ir US Guide Vasc Access Right  09/26/2011  *RADIOLOGY REPORT*  Clinical data: Failed right shunt angioplasty.  Needs access for hemodialysis.  TUNNELED HEMODIALYSIS CATHETER PLACEMENT WITH ULTRASOUND AND FLUOROSCOPIC GUIDANCE:  Comparison: None  Technique: The procedure, risks, benefits, and alternatives were explained to the patient and spouse.  Questions regarding the procedure were encouraged and answered.  The patient understands and consents to the procedure.  As antibiotic  prophylaxis, vancomycin 1 gram IV was ordered pre- procedure and administered intravenously within one hour of incision.  Patency of the right IJ vein was confirmed with ultrasound with image documentation. An appropriate skin site was determined. Region was prepped using maximum barrier technique including cap and mask, sterile gown, sterile gloves, large sterile sheet, and Chlorhexidine   as cutaneous antisepsis. The region was  infiltrated locally with 1% lidocaine.  Intravenous Fentanyl and Versed were administered as conscious sedation during continuous cardiorespiratory monitoring by the radiology RN, with a total moderate sedation time of 14 minutes.  Under real-time ultrasound guidance, the right IJ vein was accessed with a 21 gauge micropuncture needle; the needle tip within the vein was confirmed with ultrasound image documentation.   Needle exchanged over the 018 guidewire for transitional dilator, which allowed advancement of a Benson wire into the IVC. Over this, an MPA catheter was advanced. A Hemosplit 23 hemodialysis catheter was tunneled from the right anterior chest wall approach to the right IJ dermatotomy site. The MPA catheter was exchanged over an Amplatz wire for serial vascular dilators which allow placement of a peel- away sheath, through which the catheter was advanced under intermittent fluoroscopy, positioned with its tips in the proximal and midright atrium. Spot chest radiograph confirms good catheter position. No pneumothorax. Catheter was flushed and primed per protocol. Catheter secured externally with O Prolene sutures. The right IJ   dermatotomy site was closed   Dermabond. No immediate complication.  IMPRESSION:  1. Technically successful placement of tunneled right IJ hemodialysis catheter with ultrasound and fluoroscopic guidance. Ready for routine use.  Original Report Authenticated By: Trecia Rogers, M.D.    Lab Results: Results for orders placed during the hospital encounter of 09/26/11 (from the past 48 hour(s))  GLUCOSE, CAPILLARY     Status: Abnormal   Collection Time   09/30/11  9:20 PM      Component Value Range Comment   Glucose-Capillary 132 (*) 70 - 99 (mg/dL)    Comment 1 Notify RN     CBC     Status: Abnormal   Collection Time   10/01/11  5:00 AM      Component Value Range Comment   WBC 7.9  4.0 - 10.5 (K/uL)    RBC 3.31 (*) 4.22 - 5.81 (MIL/uL)    Hemoglobin 10.0 (*)  13.0 - 17.0 (g/dL)    HCT 30.8 (*) 39.0 - 52.0 (%)    MCV 93.1  78.0 - 100.0 (fL)    MCH 30.2  26.0 - 34.0 (pg)    MCHC 32.5  30.0 - 36.0 (g/dL)    RDW 17.4 (*) 11.5 - 15.5 (%)    Platelets 241  150 - 400 (K/uL)   RENAL FUNCTION PANEL     Status: Abnormal   Collection Time   10/01/11  6:45 AM      Component Value Range Comment   Sodium 141  135 - 145 (mEq/L)    Potassium 5.2 (*) 3.5 - 5.1 (mEq/L)    Chloride 102  96 - 112 (mEq/L)    CO2 26  19 - 32 (mEq/L)    Glucose, Bld 124 (*) 70 - 99 (mg/dL)    BUN 69 (*) 6 - 23 (mg/dL)    Creatinine, Ser 13.01 (*) 0.50 - 1.35 (mg/dL)    Calcium 9.3  8.4 - 10.5 (mg/dL)    Phosphorus 7.7 (*) 2.3 - 4.6 (mg/dL)    Albumin 3.0 (*) 3.5 - 5.2 (g/dL)  GFR calc non Af Amer 4 (*) >90 (mL/min)    GFR calc Af Amer 4 (*) >90 (mL/min)   HEPATITIS B SURFACE ANTIGEN     Status: Normal   Collection Time   10/01/11  7:01 AM      Component Value Range Comment   Hepatitis B Surface Ag NEGATIVE  NEGATIVE    GLUCOSE, CAPILLARY     Status: Abnormal   Collection Time   10/01/11 11:19 AM      Component Value Range Comment   Glucose-Capillary 108 (*) 70 - 99 (mg/dL)    Comment 1 Documented in Chart      Comment 2 Notify RN      Recent Results (from the past 240 hour(s))  MRSA PCR SCREENING     Status: Normal   Collection Time   09/26/11  5:06 PM      Component Value Range Status Comment   MRSA by PCR NEGATIVE  NEGATIVE  Final   CULTURE, BLOOD (ROUTINE X 2)     Status: Normal (Preliminary result)   Collection Time   09/27/11  8:58 PM      Component Value Range Status Comment   Specimen Description BLOOD LEFT FOOT   Final    Special Requests BOTTLES DRAWN AEROBIC ONLY 5CC   Final    Setup Time [id number]   Final    Culture     Final    Value:        BLOOD CULTURE RECEIVED NO GROWTH TO DATE CULTURE WILL BE HELD FOR 5 DAYS BEFORE ISSUING A FINAL NEGATIVE REPORT   Report Status PENDING   Incomplete   CULTURE, BLOOD (ROUTINE X 2)     Status: Normal  (Preliminary result)   Collection Time   09/27/11  9:09 PM      Component Value Range Status Comment   Specimen Description BLOOD RIGHT FOOT   Final    Special Requests BOTTLES DRAWN AEROBIC ONLY 5CC   Final    Setup Time [id number]   Final    Culture     Final    Value:        BLOOD CULTURE RECEIVED NO GROWTH TO DATE CULTURE WILL BE HELD FOR 5 DAYS BEFORE ISSUING A FINAL NEGATIVE REPORT   Report Status PENDING   Incomplete      Hospital Course:   [name] is a 61 year old man  Who was at [facility] in the interventional radiology department having a diatek catheter placed for hemodialysis access due to a clotted right AV graft. Routine blood work demonstrated hemoglobin 3.3. [facility] evaluation. He reported to the admitting physician that he had been having melanotic stools for 4 weeks.  No other GI symptoms were noted. Past medical history significant for inpatient evaluation in August 2012 for melena. That time he underwent EGD, colonoscopy, and capsule enteroscopy with normal results. Consideration was given for possible small bowel AVMs-during that admission and he had a single duodenal AVM cauterized. He was also found to have colonic polyps and diverticula which were not bleeding. He was chronically on aspirin and Plavix after placement of drug-eluting stent in 2008. The patient received 5 units of packed red blood cells and the discharge hemoglobin is 10. He did not display any signs of overt bleeding during this admission. He underwent upper endoscopy Without any source of bleeding being found. Dr. Teena Irani has performed the procedure. He underwent capsule endoscopy  again without any source to explain the severe anemia being found. Dr. Watt Climes recommended outpatient followup. Cardiology has seen the patient and they recommended that we discontinue Plavix. Patient was restarted on aspirin and he will have followup  labs at the dialysis unit. Discharge Exam: Blood pressure 159/78, pulse 86, temperature 98.3 F (36.8 C), temperature source Oral, resp. rate 20, height 6' (1.829 m), weight 111.5 kg (245 lb 13 oz), SpO2 96.00%.   Disposition: home  Discharge Orders    Future Appointments: Provider: Department: Dept Phone: Center:   10/12/2011 2:30 PM [facility] [phone] [facility]   10/12/2011 4:00 PM [facility] [id number] [facility]   12/25/2011 9:30 AM Jolaine Artist, [place] [phone] [id number]     Future Orders Please Complete By Expires   Diet renal 60/70-11-02-1198      Increase activity slowly         Follow-up Information    Follow up with Cleotis Nipper, MD. Make an appointment in 1 week.   Contact information:   [address] [place], [place]. [place] [phone]       Make an appointment with Elam Dutch, MD.   Contact information:   [name] [place] [phone]       Make an appointment with [name], MD.   Contact information:   [place]. [facility] [place] [phone]          Signed: Edythe Lynn 10/02/2011, 5:20 PM

## 2011-10-04 ENCOUNTER — Encounter (HOSPITAL_COMMUNITY): Payer: Self-pay | Admitting: Gastroenterology

## 2011-10-04 LAB — CULTURE, BLOOD (ROUTINE X 2)
Culture  Setup Time: 201212280137
Culture: NO GROWTH

## 2011-10-05 ENCOUNTER — Ambulatory Visit (INDEPENDENT_AMBULATORY_CARE_PROVIDER_SITE_OTHER): Payer: Medicare HMO | Admitting: Internal Medicine

## 2011-10-05 ENCOUNTER — Encounter (HOSPITAL_COMMUNITY): Payer: Self-pay | Admitting: Gastroenterology

## 2011-10-05 VITALS — BP 138/88 | HR 82 | Temp 98.6°F | Ht 72.0 in | Wt 253.2 lb

## 2011-10-05 DIAGNOSIS — N186 End stage renal disease: Secondary | ICD-10-CM

## 2011-10-05 DIAGNOSIS — D62 Acute posthemorrhagic anemia: Secondary | ICD-10-CM

## 2011-10-05 DIAGNOSIS — E119 Type 2 diabetes mellitus without complications: Secondary | ICD-10-CM

## 2011-10-05 DIAGNOSIS — I10 Essential (primary) hypertension: Secondary | ICD-10-CM

## 2011-10-05 NOTE — Patient Instructions (Signed)
You are doing well from an Internal Medicine point of view No changes in medication today Please remind the dialysis to followup with a blood count tomorrow, or call here Monday Jan 7 if needed to be done Please call Monday for a followup appt with Dr Paulita Fujita or Dr Watt Climes as well

## 2011-10-06 ENCOUNTER — Encounter: Payer: Self-pay | Admitting: Internal Medicine

## 2011-10-06 NOTE — Progress Notes (Signed)
Subjective:    Patient ID: [name], male    DOB: June 20, 1951, 61 y.o.   MRN: [id number]  HPI  Here with supportive wife, recently hospd with very severe anemia due to GI bleed secondary to AVM on ASA and plavix, now on ASA alone.  No overt bleeding or bruising. Has portacath for dialysis, due tomorrow, and thinks will get f/u cbc at that time.  Pt denies chest pain, increased sob or doe, wheezing, orthopnea, PND, increased LE swelling, palpitations, dizziness or syncope.  Pt denies new neurological symptoms such as new headache, or facial or extremity weakness or numbness   Pt denies polydipsia, polyuria, or low sugar symptoms such as weakness or confusion improved with po intake.  CBG's recetnly well controlled. No new complaints Past Medical History  Diagnosis Date  . Hyperthyroidism   . GASTROENTERITIS, VIRAL 10/14/2009  . ONYCHOMYCOSIS, TOENAILS 12/26/2007  . GOITER, MULTINODULAR 12/26/2007  . HYPERTHYROIDISM 02/02/2010  . DIABETES MELLITUS, TYPE II 02/01/2010  . DYSLIPIDEMIA 06/18/2007  . GOUT 06/18/2007  . Hypocalcemia 06/07/2010  . DEMENTIA 09/02/2009  . DEPRESSION 10/14/2009  . PERIPHERAL NEUROPATHY 06/18/2007  . HYPERTENSION 06/18/2007  . CAD, NATIVE VESSEL 02/06/2009  . CONGESTIVE HEART FAILURE 06/18/2007  . DIASTOLIC HEART FAILURE, CHRONIC 02/06/2009  . Unspecified hypotension 01/30/2010  . PULMONARY NODULE, RIGHT LOWER LOBE 06/08/2009  . GERD 06/18/2007  . ESRD 08/04/2010  . BENIGN PROSTATIC HYPERTROPHY 10/14/2009  . GYNECOMASTIA 07/17/2010  . NECK PAIN 07/31/2010  . FOOT PAIN 08/12/2008  . DIZZINESS 07/17/2010  . Other malaise and fatigue 11/24/2009  . GAIT DISTURBANCE 03/03/2010  . DYSPNEA 10/29/2008  . CHEST PAIN 03/29/2010  . TRANSAMINASES, SERUM, ELEVATED 02/01/2010  . COLONIC POLYPS, HX OF 10/14/2009  . Anemia 06/16/2011  . Ischemic cardiomyopathy 06/16/2011  . Hyperparathyroidism, secondary 06/16/2011   Past Surgical History  Procedure Date  . Stent 06/11/08   . Neck sugury 2/09  .  Back surgury 1998  . Givens capsule study 09/29/2011    Procedure: GIVENS CAPSULE STUDY;  Surgeon: Missy Sabins, MD;  Location: [facility];  Service: Endoscopy;  Laterality: Left;  . Esophagogastroduodenoscopy 09/28/2011    Procedure: ESOPHAGOGASTRODUODENOSCOPY (EGD);  Surgeon: Missy Sabins, MD;  Location: [facility] ENDOSCOPY;  Service: Endoscopy;  Laterality: N/A;  . Givens capsule study 09/30/2011    Procedure: GIVENS CAPSULE STUDY;  Surgeon: Jeryl Columbia, MD;  Location: [facility] ENDOSCOPY;  Service: Endoscopy;  Laterality: N/A;    reports that he has quit smoking. He does not have any smokeless tobacco history on file. He reports that he does not use illicit drugs. His alcohol history not on file. family history includes Heart disease in his father and sister. Allergies  Allergen Reactions  . Cephalexin Other (See Comments)    Tongue swelling  . Statins     REACTION: perceived myalgias   Current Outpatient Prescriptions on File Prior to Visit  Medication Sig Dispense Refill  . allopurinol (ZYLOPRIM) 100 MG tablet Take 100 mg by mouth daily.        [name] amitriptyline (ELAVIL) 50 MG tablet Take 50 mg by mouth daily as needed. As needed when feeling depressed.      [name] aspirin 81 MG EC tablet Take 81 mg by mouth daily.        . calcium-vitamin D (OSCAL WITH D) 500-200 MG-UNIT per tablet 2 by mouth three times per week       . carvedilol (COREG) 12.5 MG tablet TAKE 1 TABLET TWICE DAILY  180 tablet  1  . cinacalcet (SENSIPAR) 60 MG tablet Take 60 mg by mouth 2 (two) times daily.        [name] gabapentin (NEURONTIN) 300 MG capsule Take 300 mg by mouth at bedtime as needed.        . multivitamin (RENA-VIT) TABS tablet Take 1 tablet by mouth daily.        . Omega-3 Fatty Acids (FISH OIL) 1000 MG CAPS Take by mouth 3 (three) times daily.        . ranitidine (ZANTAC) 300 MG capsule Take 300 mg by mouth daily.        . WELCHOL 3.75 G PACK MIX 1 PACKET IN WATER AND DRINK ONE TIME DAILY  90 each  3   Review of  Systems Review of Systems  Constitutional: Negative for diaphoresis and unexpected weight change.  HENT: Negative for drooling and tinnitus.   Eyes: Negative for photophobia and visual disturbance.  Respiratory: Negative for choking and stridor.   Gastrointestinal: Negative for vomiting and blood in stool.  Genitourinary: Negative for hematuria and decreased urine volume.    Objective:   Physical Exam BP 138/88  Pulse 82  Temp(Src) 98.6 F (37 C) (Oral)  Ht 6' (1.829 m)  Wt 253 lb 4 oz (114.873 kg)  BMI 34.35 kg/m2  SpO2 97% Physical Exam  VS noted, not ill appaering but fatigued Constitutional: Pt appears well-developed and well-nourished.  HENT: Head: Normocephalic.  Right Ear: External ear normal.  Left Ear: External ear normal.  Eyes: Conjunctivae and EOM are normal. Pupils are equal, round, and reactive to light.  Neck: Normal range of motion. Neck supple.  Cardiovascular: Normal rate and regular rhythm.   Pulmonary/Chest: Effort normal and breath sounds normal.  Abd:  Soft, NT, non-distended, + BS Neurological: Pt is alert. No cranial nerve deficit.  Skin: Skin is warm. No erythema.  Psychiatric: Pt behavior is normal. Thought content normal.     Assessment & Plan:

## 2011-10-06 NOTE — Assessment & Plan Note (Addendum)
stable overall by hx and exam, most recent data reviewed with pt, and pt to continue medical treatment as before, for f/u cbc mon jan 7 if not done with dialysis, plans to also f/u with GI - Dr Paulita Fujita or Dr Watt Climes soon/wife to arrange

## 2011-10-06 NOTE — Assessment & Plan Note (Signed)
stable overall by hx and exam, most recent data reviewed with pt, and pt to continue medical treatment as before  Lab Results  Component Value Date   HGBA1C 5.4 06/15/2011

## 2011-10-06 NOTE — Assessment & Plan Note (Signed)
For dialysis tomorrow as planned

## 2011-10-06 NOTE — Assessment & Plan Note (Signed)
stable overall by hx and exam, most recent data reviewed with pt, and pt to continue medical treatment as before  BP Readings from Last 3 Encounters:  10/05/11 138/88  10/01/11 159/78  10/01/11 159/78

## 2011-10-12 ENCOUNTER — Ambulatory Visit: Payer: Medicare HMO

## 2011-10-12 ENCOUNTER — Other Ambulatory Visit: Payer: Medicare HMO

## 2011-10-16 ENCOUNTER — Encounter: Payer: Self-pay | Admitting: Internal Medicine

## 2011-10-16 DIAGNOSIS — G4733 Obstructive sleep apnea (adult) (pediatric): Secondary | ICD-10-CM

## 2011-10-16 DIAGNOSIS — E785 Hyperlipidemia, unspecified: Secondary | ICD-10-CM

## 2011-10-16 HISTORY — DX: Hyperlipidemia, unspecified: E78.5

## 2011-10-16 HISTORY — DX: Obstructive sleep apnea (adult) (pediatric): G47.33

## 2011-10-18 ENCOUNTER — Encounter: Payer: Self-pay | Admitting: Physician Assistant

## 2011-10-19 ENCOUNTER — Ambulatory Visit: Payer: Medicare HMO | Admitting: Vascular Surgery

## 2011-10-19 ENCOUNTER — Other Ambulatory Visit: Payer: Medicare HMO

## 2011-10-19 ENCOUNTER — Ambulatory Visit (INDEPENDENT_AMBULATORY_CARE_PROVIDER_SITE_OTHER): Payer: Medicare HMO | Admitting: Physician Assistant

## 2011-10-19 ENCOUNTER — Encounter: Payer: Self-pay | Admitting: Physician Assistant

## 2011-10-19 VITALS — BP 145/76 | HR 93 | Resp 20 | Ht 73.0 in | Wt 249.0 lb

## 2011-10-19 DIAGNOSIS — N186 End stage renal disease: Secondary | ICD-10-CM

## 2011-10-19 NOTE — Progress Notes (Signed)
[facility] [place] HISTORY AND PHYSICAL   CC:  Needs permanent access   [name] Emms, MD  HPI: This is a 61 y.o. male here for permanent access.  He recently underwent right FA AVG cannulation with right arm shuntogram and angioplasty of AVG and brachial vein x 3 on 09/19/12.  It has since clotted off and he has had a right diatek placed by IR.  He has had numerous revisions of his right FA AVG and at this point is not a candidate for further revision.  He has also had a previous left BVT, which also failed. [name]  He recently was admitted to the hospital for severe anemia with a Hgb of ~3 and received transfusions.  He states that they did not find out the cause of his anemia, but they have taken him off the Plavix and he has not had anymore bleeding since.  Dr. Oneida Alar saw the pt in consult in the hospital and the pt was too sick at that time to proceed with permanent access.  He presents today for permanent access options.  Past Medical History  Diagnosis Date  . Hyperthyroidism   . GASTROENTERITIS, VIRAL 10/14/2009  . ONYCHOMYCOSIS, TOENAILS 12/26/2007  . GOITER, MULTINODULAR 12/26/2007  . HYPERTHYROIDISM 02/02/2010  . DIABETES MELLITUS, TYPE II 02/01/2010  . DYSLIPIDEMIA 06/18/2007  . GOUT 06/18/2007  . Hypocalcemia 06/07/2010  . DEMENTIA 09/02/2009  . DEPRESSION 10/14/2009  . PERIPHERAL NEUROPATHY 06/18/2007  . HYPERTENSION 06/18/2007  . CAD, NATIVE VESSEL 02/06/2009  . CONGESTIVE HEART FAILURE 06/18/2007  . DIASTOLIC HEART FAILURE, CHRONIC 02/06/2009  . Unspecified hypotension 01/30/2010  . PULMONARY NODULE, RIGHT LOWER LOBE 06/08/2009  . GERD 06/18/2007  . ESRD 08/04/2010  . BENIGN PROSTATIC HYPERTROPHY 10/14/2009  . GYNECOMASTIA 07/17/2010  . NECK PAIN 07/31/2010  . FOOT PAIN 08/12/2008  . DIZZINESS 07/17/2010  . Other malaise and fatigue 11/24/2009  . GAIT DISTURBANCE 03/03/2010  . DYSPNEA 10/29/2008  . CHEST PAIN 03/29/2010  . TRANSAMINASES, SERUM, ELEVATED 02/01/2010  .  COLONIC POLYPS, HX OF 10/14/2009  . Anemia 06/16/2011  . Ischemic cardiomyopathy 06/16/2011  . Hyperparathyroidism, secondary 06/16/2011  . OSA on CPAP 10/16/2011  . Hyperlipidemia 10/16/2011   Past Surgical History  Procedure Date  . Stent 06/11/08   . Neck sugury 2/09  . Back surgury 1998  . Givens capsule study 09/29/2011    Procedure: GIVENS CAPSULE STUDY;  Surgeon: Missy Sabins, MD;  Location: [facility];  Service: Endoscopy;  Laterality: Left;  . Esophagogastroduodenoscopy 09/28/2011    Procedure: ESOPHAGOGASTRODUODENOSCOPY (EGD);  Surgeon: Missy Sabins, MD;  Location: [facility] ENDOSCOPY;  Service: Endoscopy;  Laterality: N/A;  . Givens capsule study 09/30/2011    Procedure: GIVENS CAPSULE STUDY;  Surgeon: Jeryl Columbia, MD;  Location: [facility] ENDOSCOPY;  Service: Endoscopy;  Laterality: N/A;    Allergies  Allergen Reactions  . Ampicillin   . Cephalexin Other (See Comments)    Tongue swelling  . Statins     REACTION: perceived myalgias    Current Outpatient Prescriptions  Medication Sig Dispense Refill  . allopurinol (ZYLOPRIM) 100 MG tablet Take 100 mg by mouth daily.        [name] amitriptyline (ELAVIL) 50 MG tablet Take 50 mg by mouth daily as needed. As needed when feeling depressed.      [name] aspirin 81 MG EC tablet Take 81 mg by mouth daily.        . calcium-vitamin D (OSCAL WITH D) 500-200 MG-UNIT  per tablet 2 by mouth three times per week       . carvedilol (COREG) 12.5 MG tablet TAKE 1 TABLET TWICE DAILY  180 tablet  1  . cinacalcet (SENSIPAR) 60 MG tablet Take 60 mg by mouth 2 (two) times daily.        [name] gabapentin (NEURONTIN) 300 MG capsule Take 300 mg by mouth at bedtime as needed.        . multivitamin (RENA-VIT) TABS tablet Take 1 tablet by mouth daily.        . Omega-3 Fatty Acids (FISH OIL) 1000 MG CAPS Take by mouth 3 (three) times daily.        . ranitidine (ZANTAC) 300 MG capsule Take 300 mg by mouth daily.        . WELCHOL 3.75 G PACK MIX 1 PACKET IN WATER AND DRINK ONE TIME  DAILY  90 each  3    Family History  Problem Relation Age of Onset  . Heart disease Father   . Heart disease Sister     History   Social History  . Marital Status: Married    Spouse Name: N/A    Number of Children: N/A  . Years of Education: N/A   Occupational History  . disabled   . formerly Lawyer for [employer].    Social History Main Topics  . Smoking status: Former Smoker -- 1.0 packs/day for 25 years    Types: Cigarettes  . Smokeless tobacco: Never Used   Comment: Quit smoking 2007 Smoked x 25 years 1/2 ppd.  . Alcohol Use: No  . Drug Use: No  . Sexually Active: Not on file   Other Topics Concern  . Not on file   Social History Narrative  . No narrative on file     ROS: [x]  Positive   [ ]  Negative   [x]  All sytems reviewed and are negative See HPI Cardiovascular: [] chest pain; [] chest pressure; [] palpitations; [] SOB lying flat; [] DOE; [] pain in legs with walking; [] pain in legs when lying flat; [] Hx of DVT; [] Hx phlebitis; [] swelling in legs; [] varicose veins Pulmonary: [] productive cough; [] asthma; [] wheezing Neurologic:  [] Hx CVA;  [] weakness in arms or legs; [] numbness in arms or legs; [] difficulty in speaking or slurred speech; [] temporary loss of vision in one eye; [] dizziness Hematologic:  [] bleeding problems; [] clots easily GI:  [] vomiting blood; []  blood in stool; [] PUD GU: []  Dysuria; [] hematuria Psychiatric:  [] Hx major depression Integumentary:  [] rashes; [] ulcers Constitutional:  [] fever; [] chills    PHYSICAL EXAMINATION:  Filed Vitals:   10/19/11 1508  BP: 145/76  Pulse: 93  Resp: 20   Body mass index is 32.85 kg/(m^2).  General:  WDWN in NAD Gait: Normal HENT: WNL Eyes: PERRL Pulmonary: normal non-labored breathing , without Rales, rhonchi,  wheezing Cardiac: RRR, without  Murmurs, rubs or gallops; No carotid bruits Abdomen: soft, NT, no masses Skin: no rashes, ulcers noted Vascular Exam/Pulses: +palpable bilateral  radial pulses;  Extremities without ischemic changes, no Gangrene , no cellulitis; no open wounds;  Musculoskeletal: no muscle wasting or atrophy  Neurologic: A&O X 3; Appropriate Affect ; SENSATION: normal; MOTOR FUNCTION:  moving all extremities equally. Speech is fluent/normal  Non-Invasive Vascular Imaging: none  ASSESSMENT: 61 y.o. male with a Hx multiple permanent accesses that have failed.  He was recently in the hospital with severe anemia.  The anemia has resolved and he is here for permanent access options.  PLAN: Will schedule for a bilateral venogram with Dr. Bridgett Larsson to  determine what is open and able to be used for permanent access since he has had a RUA AVG and a left BVT.  Dr. Oneida Alar d/w the pt about a new RUA AVG, but Dr. Bridgett Larsson would like to evaluate the left arm venous system to determine the best avenue for access.  Evorn Gong, PA-C [facility] [phone]  Clinic MD:  Bridgett Larsson

## 2011-10-22 ENCOUNTER — Other Ambulatory Visit: Payer: Self-pay | Admitting: *Deleted

## 2011-10-23 ENCOUNTER — Ambulatory Visit (INDEPENDENT_AMBULATORY_CARE_PROVIDER_SITE_OTHER): Payer: Medicare HMO | Admitting: Internal Medicine

## 2011-10-23 ENCOUNTER — Encounter (HOSPITAL_COMMUNITY): Payer: Self-pay | Admitting: Pharmacy Technician

## 2011-10-23 ENCOUNTER — Encounter: Payer: Self-pay | Admitting: Internal Medicine

## 2011-10-23 VITALS — BP 162/90 | HR 87 | Ht 72.0 in | Wt 256.1 lb

## 2011-10-23 DIAGNOSIS — I10 Essential (primary) hypertension: Secondary | ICD-10-CM

## 2011-10-23 DIAGNOSIS — E785 Hyperlipidemia, unspecified: Secondary | ICD-10-CM

## 2011-10-23 DIAGNOSIS — I2510 Atherosclerotic heart disease of native coronary artery without angina pectoris: Secondary | ICD-10-CM

## 2011-10-23 NOTE — Assessment & Plan Note (Signed)
Very stable. No evidence of ischemia even in setting of huge stress test with Hgb = 3. Continue current regimen.

## 2011-10-23 NOTE — Assessment & Plan Note (Signed)
Lipids markedly elevated. Followed by PCP. Unable to tolerate statins due to severe myositis. Consider referral to Lipid Clinic.

## 2011-10-23 NOTE — Progress Notes (Signed)
HPI:  [name] is 61 year old male with multiple medical problems including morbid obesity, diabetes, myositis with a sed rate > 100, COPD on nighttime oxygen, sleep apnea on CPAP, end-stage renal disease on HD.  We follow him for coronary artery disease, status post Taxus drug-eluting stent to the right coronary in September 2008, and a previous history of congestive heart failure secondary to ischemic cardiomyopathy with an EF of 37%. Most recent echocardiogram September 2010  however, showed an EF of 55% with mild diastolic dysfunction. Myoview had EF of 43% with inferior scar. no ischemia. Had [facility] in 2/11 which showed low pressures (PCWP = 2) , PA 26/16 (22)  and normal cardiac output.  Has been having problems with graft clotting. Admitted December 2012 with HgB = 3. Had endoscopy without clear source. Got 5 u RBCs.   Denies CP. Mild dyspnea.. No edema. BP still going low during HD - unclear if this is worse since 2nd fistula was placed. No edema, orthopnea or PND. Wearing CPAP.   Echo 12/12: EF [id number] grade 2 diastolic dysfunction  ROS: All systems negative except as listed in HPI, PMH and Problem List.  Past Medical History  Diagnosis Date  . Hyperthyroidism   . GASTROENTERITIS, VIRAL 10/14/2009  . ONYCHOMYCOSIS, TOENAILS 12/26/2007  . GOITER, MULTINODULAR 12/26/2007  . HYPERTHYROIDISM 02/02/2010  . DIABETES MELLITUS, TYPE II 02/01/2010  . DYSLIPIDEMIA 06/18/2007  . GOUT 06/18/2007  . Hypocalcemia 06/07/2010  . DEMENTIA 09/02/2009  . DEPRESSION 10/14/2009  . PERIPHERAL NEUROPATHY 06/18/2007  . HYPERTENSION 06/18/2007  . CAD, NATIVE VESSEL 02/06/2009  . CONGESTIVE HEART FAILURE 06/18/2007  . DIASTOLIC HEART FAILURE, CHRONIC 02/06/2009  . Unspecified hypotension 01/30/2010  . PULMONARY NODULE, RIGHT LOWER LOBE 06/08/2009  . GERD 06/18/2007  . ESRD 08/04/2010  . BENIGN PROSTATIC HYPERTROPHY 10/14/2009  . GYNECOMASTIA 07/17/2010  . NECK PAIN 07/31/2010  . FOOT PAIN 08/12/2008  . DIZZINESS 07/17/2010    . Other malaise and fatigue 11/24/2009  . GAIT DISTURBANCE 03/03/2010  . DYSPNEA 10/29/2008  . CHEST PAIN 03/29/2010  . TRANSAMINASES, SERUM, ELEVATED 02/01/2010  . COLONIC POLYPS, HX OF 10/14/2009  . Anemia 06/16/2011  . Ischemic cardiomyopathy 06/16/2011  . Hyperparathyroidism, secondary 06/16/2011  . OSA on CPAP 10/16/2011  . Hyperlipidemia 10/16/2011    Current Outpatient Prescriptions  Medication Sig Dispense Refill  . allopurinol (ZYLOPRIM) 100 MG tablet Take 100 mg by mouth daily.        [name] amitriptyline (ELAVIL) 50 MG tablet Take 50 mg by mouth daily as needed. As needed when feeling depressed.      [name] aspirin 81 MG EC tablet Take 81 mg by mouth daily.        . calcium-vitamin D (OSCAL WITH D) 500-200 MG-UNIT per tablet 2 by mouth three times per week       . carvedilol (COREG) 12.5 MG tablet TAKE 1 TABLET TWICE DAILY  180 tablet  1  . cinacalcet (SENSIPAR) 60 MG tablet Take 60 mg by mouth 2 (two) times daily.        [name] gabapentin (NEURONTIN) 300 MG capsule Take 300 mg by mouth at bedtime as needed.        . multivitamin (RENA-VIT) TABS tablet Take 1 tablet by mouth daily.        . Omega-3 Fatty Acids (FISH OIL) 1000 MG CAPS Take by mouth 3 (three) times daily.        . ranitidine (ZANTAC) 300 MG capsule Take 300 mg by mouth daily.        [name]  WELCHOL 3.75 G PACK MIX 1 PACKET IN WATER AND DRINK ONE TIME DAILY  90 each  3     PHYSICAL EXAM: Filed Vitals:   10/23/11 0951  BP: 162/90  Pulse:    General:  Well appearing. No resp difficulty HEENT: normal Neck: supple. JVP flat. Carotids 2+ bilaterally; no bruits. No lymphadenopathy or thryomegaly appreciated. Cor: PMI normal. Regular rate & rhythm. No rubs, gallops or murmurs. Lungs: clear Abdomen: soft, nontender, nondistended. No hepatosplenomegaly. No bruits or masses. Good bowel sounds. Extremities: no cyanosis, clubbing, rash, edema Neuro: alert & orientedx3, cranial nerves grossly intact. Moves all 4 extremities w/o difficulty.  Affect pleasant.      ASSESSMENT & PLAN:

## 2011-10-23 NOTE — Patient Instructions (Signed)
Your physician wants you to follow-up in: 9 months in the Heart Failure Clinic at [facility].   You will receive a reminder letter in the mail two months in advance. If you don't receive a letter, please call our office to schedule the follow-up appointment.

## 2011-10-23 NOTE — Assessment & Plan Note (Signed)
BP elevated in the setting of not taking BP meds prior to HD. Otherwise seems well controlled.

## 2011-10-25 MED ORDER — HEPARIN (PORCINE) IN NACL 2-0.9 UNIT/ML-% IJ SOLN
INTRAMUSCULAR | Status: AC
  Filled 2011-10-25: qty 2000

## 2011-10-25 MED ORDER — BIVALIRUDIN 250 MG IV SOLR
INTRAVENOUS | Status: AC
  Filled 2011-10-25: qty 250

## 2011-10-25 MED ORDER — EPTIFIBATIDE 75 MG/100ML IV SOLN
INTRAVENOUS | Status: AC
  Filled 2011-10-25: qty 100

## 2011-10-25 MED ORDER — ATROPINE SULFATE 1 MG/ML IJ SOLN
INTRAMUSCULAR | Status: AC
  Filled 2011-10-25: qty 1

## 2011-10-25 MED ORDER — FENTANYL CITRATE 0.05 MG/ML IJ SOLN
INTRAMUSCULAR | Status: AC
  Filled 2011-10-25: qty 2

## 2011-10-25 MED ORDER — PRASUGREL HCL 10 MG PO TABS
ORAL_TABLET | ORAL | Status: AC
  Filled 2011-10-25: qty 6

## 2011-10-25 MED ORDER — NITROGLYCERIN 0.2 MG/ML ON CALL CATH LAB
INTRAVENOUS | Status: AC
  Filled 2011-10-25: qty 1

## 2011-10-25 MED ORDER — LIDOCAINE HCL (PF) 1 % IJ SOLN
INTRAMUSCULAR | Status: AC
  Filled 2011-10-25: qty 30

## 2011-10-25 MED ORDER — DOPAMINE-DEXTROSE 3.2-5 MG/ML-% IV SOLN
INTRAVENOUS | Status: AC
  Filled 2011-10-25: qty 250

## 2011-10-25 MED ORDER — MIDAZOLAM HCL 2 MG/2ML IJ SOLN
INTRAMUSCULAR | Status: AC
  Filled 2011-10-25: qty 2

## 2011-10-26 ENCOUNTER — Ambulatory Visit: Payer: Medicare HMO | Admitting: Vascular Surgery

## 2011-10-29 ENCOUNTER — Ambulatory Visit (HOSPITAL_COMMUNITY)
Admission: RE | Admit: 2011-10-29 | Discharge: 2011-10-29 | Disposition: A | Discharge status: Home / Self Care | Payer: Medicare HMO | Source: Ambulatory Visit | Attending: Vascular Surgery | Admitting: Vascular Surgery

## 2011-10-29 ENCOUNTER — Encounter (HOSPITAL_COMMUNITY)
Admission: RE | Disposition: A | Discharge status: Home / Self Care | Payer: Self-pay | Source: Ambulatory Visit | Attending: Vascular Surgery

## 2011-10-29 DIAGNOSIS — I871 Compression of vein: Secondary | ICD-10-CM | POA: Insufficient documentation

## 2011-10-29 DIAGNOSIS — N186 End stage renal disease: Secondary | ICD-10-CM | POA: Insufficient documentation

## 2011-10-29 SURGERY — VENOGRAM EXTREMITY BILATERAL

## 2011-10-29 MED ORDER — SODIUM CHLORIDE 0.9 % IJ SOLN: 3.0000 mL | INTRAMUSCULAR | Status: DC | PRN

## 2011-10-29 NOTE — Op Note (Signed)
OPERATIVE NOTE   PROCEDURE: 1. bilateral arm and central venogram   PRE-OPERATIVE DIAGNOSIS: end stage renal disease  POST-OPERATIVE DIAGNOSIS: same as above   SURGEON: Adele Barthel, MD  ANESTHESIA: local  ESTIMATED BLOOD LOSS: 5 cc  FINDING(S): 1. Left distal venous collaterals fill a small cephalic vein and small brachial vein 2. At the level of the left confluence, a 6-7 mm axillary vein with acceptable brachial vein just distal is evident 3. Patent left subclavian and innominate 4. No obvious stenosis in superior vena cava is evident 5. Right distal venous collaterals fill a small cephalic vein.  Right brachial and basilic vein not visualized. 6. Proximal high brachial vein on right is filled retrograde from cephalic drainage into axillary vein 7. Patent right subclavian and innominate vein. 8. Based on venous collateralization on the chest, likely right innominate vein stenosis obturated by tunneled dialysis catheter.   SPECIMEN(S):  None  CONTRAST: 70 cc  INDICATIONS: [name] is a 61 y.o. male who  presents with end stage renal disease.  The patient is scheduled for bilateral venogram to help determine the availability of proximal veins for permanent access placement.  The patient is aware the risks include but are not limited to: bleeding, infection, thrombosis of the cannulated access, and possible anaphylactic reaction to the contrast.  The patient is aware of the risks of the procedure and elects to proceed forward.  DESCRIPTION: After full informed written consent was obtained, the patient was brought back to the angiography suite and placed supine upon the angiography table.  The patient was connected to monitoring equipment.  The left forearm IV was connected to IV extension tubing.  Hand injections were completed to image the arm veins and central venous structures, the findings of which are listed above.  Then attention was turned to the right arm.  The right  forearm IV was connected to IV extension tubing.  Hand injections were completed to image the arm veins and central venous structures, the findings of which are listed above.  Based on the imaging, I would proceed with left upper arm arteriovenous graft.  I suspect a right upper arm arteriovenous graft would result in right arm swelling due to venous outflow stenosis with removing the tunneled dialysis catheter.  COMPLICATIONS: none  CONDITION: stable  Adele Barthel, MD [facility] [place] Office: [phone] Pager: [phone]  10/29/2011 1:33 PM

## 2011-10-29 NOTE — H&P (Signed)
[facility] [place]  Brief Access History and Physical  History of Present Illness  [name] is a 61 y.o. male who presents with chief complaint: thrombosed R FA AVG.  The patient presents today for B arm and central venogram.    Past Medical History  Diagnosis Date  . Hyperthyroidism   . GASTROENTERITIS, VIRAL 10/14/2009  . ONYCHOMYCOSIS, TOENAILS 12/26/2007  . GOITER, MULTINODULAR 12/26/2007  . HYPERTHYROIDISM 02/02/2010  . DIABETES MELLITUS, TYPE II 02/01/2010  . DYSLIPIDEMIA 06/18/2007  . GOUT 06/18/2007  . Hypocalcemia 06/07/2010  . DEMENTIA 09/02/2009  . DEPRESSION 10/14/2009  . PERIPHERAL NEUROPATHY 06/18/2007  . HYPERTENSION 06/18/2007  . CAD, NATIVE VESSEL 02/06/2009  . CONGESTIVE HEART FAILURE 06/18/2007  . DIASTOLIC HEART FAILURE, CHRONIC 02/06/2009  . Unspecified hypotension 01/30/2010  . PULMONARY NODULE, RIGHT LOWER LOBE 06/08/2009  . GERD 06/18/2007  . ESRD 08/04/2010  . BENIGN PROSTATIC HYPERTROPHY 10/14/2009  . GYNECOMASTIA 07/17/2010  . NECK PAIN 07/31/2010  . FOOT PAIN 08/12/2008  . DIZZINESS 07/17/2010  . Other malaise and fatigue 11/24/2009  . GAIT DISTURBANCE 03/03/2010  . DYSPNEA 10/29/2008  . CHEST PAIN 03/29/2010  . TRANSAMINASES, SERUM, ELEVATED 02/01/2010  . COLONIC POLYPS, HX OF 10/14/2009  . Anemia 06/16/2011  . Ischemic cardiomyopathy 06/16/2011  . Hyperparathyroidism, secondary 06/16/2011  . OSA on CPAP 10/16/2011  . Hyperlipidemia 10/16/2011    Past Surgical History  Procedure Date  . Stent 06/11/08   . Neck sugury 2/09  . Back surgury 1998  . Givens capsule study 09/29/2011    Procedure: GIVENS CAPSULE STUDY;  Surgeon: Missy Sabins, MD;  Location: [facility];  Service: Endoscopy;  Laterality: Left;  . Esophagogastroduodenoscopy 09/28/2011    Procedure: ESOPHAGOGASTRODUODENOSCOPY (EGD);  Surgeon: Missy Sabins, MD;  Location: [facility] ENDOSCOPY;  Service: Endoscopy;  Laterality: N/A;  . Givens capsule study 09/30/2011    Procedure: GIVENS CAPSULE  STUDY;  Surgeon: Jeryl Columbia, MD;  Location: [facility] ENDOSCOPY;  Service: Endoscopy;  Laterality: N/A;    History   Social History  . Marital Status: Married    Spouse Name: N/A    Number of Children: N/A  . Years of Education: N/A   Occupational History  . disabled   . formerly Lawyer for [employer].    Social History Main Topics  . Smoking status: Former Smoker -- 1.0 packs/day for 25 years    Types: Cigarettes  . Smokeless tobacco: Never Used   Comment: Quit smoking 2007 Smoked x 25 years 1/2 ppd.  . Alcohol Use: No  . Drug Use: No  . Sexually Active: Not on file   Other Topics Concern  . Not on file   Social History Narrative  . No narrative on file    Family History  Problem Relation Age of Onset  . Heart disease Father   . Heart disease Sister     No current facility-administered medications on file prior to encounter.   Current Outpatient Prescriptions on File Prior to Encounter  Medication Sig Dispense Refill  . allopurinol (ZYLOPRIM) 100 MG tablet Take 100 mg by mouth daily.        [name] amitriptyline (ELAVIL) 50 MG tablet Take 50 mg by mouth daily as needed. As needed when feeling depressed.      [name] aspirin 81 MG EC tablet Take 81 mg by mouth daily.        . calcium-vitamin D (OSCAL WITH D) 500-200 MG-UNIT per tablet Take 3 tablets by mouth  3 (three) times daily.       . carvedilol (COREG) 12.5 MG tablet TAKE 1 TABLET TWICE DAILY  180 tablet  1  . cinacalcet (SENSIPAR) 60 MG tablet Take 60 mg by mouth 2 (two) times daily.        [name] gabapentin (NEURONTIN) 300 MG capsule Take 300 mg by mouth at bedtime as needed.        . multivitamin (RENA-VIT) TABS tablet Take 1 tablet by mouth daily.        . Omega-3 Fatty Acids (FISH OIL) 1000 MG CAPS Take by mouth 3 (three) times daily.        . ranitidine (ZANTAC) 300 MG capsule Take 300 mg by mouth daily.        . WELCHOL 3.75 G PACK MIX 1 PACKET IN WATER AND DRINK ONE TIME DAILY  90 each  3    Allergies  Allergen  Reactions  . Cephalexin Other (See Comments)    Tongue swelling  . Statins     REACTION: perceived myalgias    Review of Systems: Kidney Disease, As listed above, otherwise negative.  Physical Examination  Filed Vitals:   10/29/11 1132  BP: 144/87  Pulse: 85  Temp: 98.6 F (37 C)  TempSrc: Oral  Resp: 18  Height: 6\' 1"  (1.854 m)  Weight: 248 lb (112.492 kg)  SpO2: 93%   Body mass index is 32.72 kg/(m^2).  General: A&O x 3, WDWN, obese  Pulmonary: Sym exp, good air movt, CTAB, no rales, rhonchi, & wheezing  Cardiac: RRR, Nl S1, S2, no Murmurs, rubs or gallops  Gastrointestinal: soft, NTND, -G/R, - HSM, - masses, - CVAT B  Musculoskeletal: M/S 5/5 throughout , Extremities without ischemic changes , R forearm AVG thrombosed, L BVT thrombosed  Laboratory See [place] is a 61 y.o. male who presents with: ESRD requiring evaluation for new access.   The patient is scheduled for: B arm and central venogram.  I discussed with the patient the nature of angiographic procedures, especially the limited patencies of any endovascular intervention.  The patient is aware of that the risks of an angiographic procedure include but are not limited to: bleeding, infection, access site complications, renal failure, embolization, rupture of vessel, dissection, possible need for emergent surgical intervention, possible need for surgical procedures to treat the patient's pathology, and stroke and death.    The patient is aware of the risks and agrees to proceed.  Adele Barthel, MD [facility] [place] Office: [phone] Pager: [phone]  10/29/2011, 12:02 PM

## 2011-10-30 ENCOUNTER — Encounter (HOSPITAL_COMMUNITY): Payer: Self-pay

## 2011-10-30 LAB — POCT I-STAT, CHEM 8
Calcium, Ion: 1.22 mmol/L (ref 1.12–1.32)
Glucose, Bld: 145 mg/dL — ABNORMAL HIGH (ref 70–99)
HCT: 35 % — ABNORMAL LOW (ref 39.0–52.0)
Hemoglobin: 11.9 g/dL — ABNORMAL LOW (ref 13.0–17.0)

## 2011-11-02 ENCOUNTER — Other Ambulatory Visit: Payer: Self-pay | Admitting: *Deleted

## 2011-11-05 ENCOUNTER — Encounter (HOSPITAL_COMMUNITY): Payer: Self-pay

## 2011-11-05 NOTE — Progress Notes (Signed)
Forwarded to [facility] for cardiac/pulmonary review.

## 2011-11-06 MED ORDER — VANCOMYCIN HCL IN DEXTROSE 1-5 GM/200ML-% IV SOLN: 1000.0000 mg | INTRAVENOUS | Status: DC

## 2011-11-06 NOTE — Consult Note (Signed)
Anesthesia:  Patient is a 61 year old male scheduled for a LUE AVGG on 11/07/11.  He will be a same day work-up.  History includes ESRD on HD, obesity, hyperthyroidism, goiter, DM2, dyslipidemia, depression, HTN, chronic diastolic HF, BPH, elevated transaminases, anemia, ischemic CM (EF 55% 09/2011), secondary hyperparathyroidism, OSA on CPAP, CAD s/p RCA stent, and former smoker.  He was admitted in December of 2012 with a Hgb of 3.7, endoscopy revealed no clear source.  He was transfused with five units of PRBCs.  His cardiologist is Dr. [name].  He was last seen on 10/23/11.  According to his note, "We follow him for coronary artery disease, status post Taxus drug-eluting stent to the right coronary in September 2008, and a previous history of congestive heart failure secondary to ischemic cardiomyopathy with an EF of 37%. Most recent echocardiogram September 2010 however, showed an EF of 55% with mild diastolic dysfunction. Myoview had EF of 43% with inferior scar. no ischemia. Had [facility] in 2/11 which showed low pressures (PCWP = 2) , PA 26/16 (22) and normal cardiac output."  (Full 11/11/09 [facility] report can be viewed under Notes tab and the nuclear stress test from 06/13/09 is under the Encounters or Notes tab).  A more recent echo on 09/26/12 showed EF [id number], grade 2 diastolic dysfunction, mild LVH, LA mildly dilated.  His EKG from 09/26/11 showed SR, repolarization abnormality with new inferior and lateral T wave inversion.  Per Dr. Clayborne Dana 10/23/11 note, patient's CAD was felt "Very stable. No evidence of ischemia even in setting of huge stress test with Hgb = 3. Continue current regimen."   His last sleep study was in 2007.  He is for labs and CXR on arrival.

## 2011-11-07 ENCOUNTER — Encounter (HOSPITAL_COMMUNITY)
Admission: RE | Disposition: A | Discharge status: Home / Self Care | Payer: Self-pay | Source: Ambulatory Visit | Attending: Vascular Surgery

## 2011-11-07 ENCOUNTER — Ambulatory Visit (HOSPITAL_COMMUNITY): Payer: Medicare HMO

## 2011-11-07 ENCOUNTER — Ambulatory Visit (HOSPITAL_COMMUNITY): Payer: Medicare HMO | Admitting: Vascular Surgery

## 2011-11-07 ENCOUNTER — Ambulatory Visit (HOSPITAL_COMMUNITY)
Admission: RE | Admit: 2011-11-07 | Discharge: 2011-11-07 | Disposition: A | Discharge status: Home / Self Care | Payer: Medicare HMO | Source: Ambulatory Visit | Attending: Vascular Surgery | Admitting: Vascular Surgery

## 2011-11-07 ENCOUNTER — Encounter (HOSPITAL_COMMUNITY): Payer: Self-pay | Admitting: Vascular Surgery

## 2011-11-07 DIAGNOSIS — F411 Generalized anxiety disorder: Secondary | ICD-10-CM | POA: Insufficient documentation

## 2011-11-07 DIAGNOSIS — K219 Gastro-esophageal reflux disease without esophagitis: Secondary | ICD-10-CM | POA: Insufficient documentation

## 2011-11-07 DIAGNOSIS — N186 End stage renal disease: Secondary | ICD-10-CM

## 2011-11-07 DIAGNOSIS — I2510 Atherosclerotic heart disease of native coronary artery without angina pectoris: Secondary | ICD-10-CM | POA: Insufficient documentation

## 2011-11-07 DIAGNOSIS — I120 Hypertensive chronic kidney disease with stage 5 chronic kidney disease or end stage renal disease: Secondary | ICD-10-CM | POA: Insufficient documentation

## 2011-11-07 DIAGNOSIS — E119 Type 2 diabetes mellitus without complications: Secondary | ICD-10-CM | POA: Insufficient documentation

## 2011-11-07 DIAGNOSIS — R0602 Shortness of breath: Secondary | ICD-10-CM | POA: Insufficient documentation

## 2011-11-07 HISTORY — DX: Sleep apnea, unspecified: G47.30

## 2011-11-07 HISTORY — PX: AV FISTULA PLACEMENT: SHX1204

## 2011-11-07 LAB — APTT: aPTT: 35 seconds (ref 24–37)

## 2011-11-07 LAB — GLUCOSE, CAPILLARY: Glucose-Capillary: 140 mg/dL — ABNORMAL HIGH (ref 70–99)

## 2011-11-07 SURGERY — INSERTION OF ARTERIOVENOUS (AV) GORE-TEX GRAFT ARM
Anesthesia: Monitor Anesthesia Care | Site: Arm Upper | Laterality: Left | Wound class: Clean

## 2011-11-07 MED ORDER — MEPERIDINE HCL 25 MG/ML IJ SOLN: 6.2500 mg | INTRAMUSCULAR | Status: DC | PRN

## 2011-11-07 MED ORDER — SODIUM CHLORIDE 0.9 % IV SOLN
INTRAVENOUS | Status: DC | PRN
  Administered 2011-11-07 (×3): via INTRAVENOUS

## 2011-11-07 MED ORDER — PROMETHAZINE HCL 25 MG/ML IJ SOLN: 6.2500 mg | INTRAMUSCULAR | Status: DC | PRN

## 2011-11-07 MED ORDER — MUPIROCIN 2 % EX OINT
TOPICAL_OINTMENT | CUTANEOUS | Status: AC
  Filled 2011-11-07: qty 22

## 2011-11-07 MED ORDER — PHENYLEPHRINE HCL 10 MG/ML IJ SOLN
10.0000 mg | INTRAVENOUS | Status: DC | PRN
  Administered 2011-11-07: 40 ug/min via INTRAVENOUS
  Administered 2011-11-07: 120 ug via INTRAVENOUS

## 2011-11-07 MED ORDER — PROPOFOL 10 MG/ML IV EMUL
INTRAVENOUS | Status: DC | PRN
  Administered 2011-11-07: 200 mg via INTRAVENOUS

## 2011-11-07 MED ORDER — SODIUM CHLORIDE 0.9 % IV SOLN: INTRAVENOUS | Status: DC

## 2011-11-07 MED ORDER — HYDROMORPHONE HCL PF 1 MG/ML IJ SOLN: 0.2500 mg | INTRAMUSCULAR | Status: DC | PRN

## 2011-11-07 MED ORDER — VANCOMYCIN HCL 1000 MG IV SOLR
1500.0000 mg | INTRAVENOUS | Status: DC
  Filled 2011-11-07: qty 1500

## 2011-11-07 MED ORDER — MIDAZOLAM HCL 5 MG/5ML IJ SOLN
INTRAMUSCULAR | Status: DC | PRN
  Administered 2011-11-07: 2 mg via INTRAVENOUS

## 2011-11-07 MED ORDER — SODIUM CHLORIDE 0.9 % IR SOLN
Status: DC | PRN
  Administered 2011-11-07: 09:00:00

## 2011-11-07 MED ORDER — OXYCODONE HCL 5 MG PO TABS: 5.0000 mg | ORAL_TABLET | ORAL | Status: AC | PRN

## 2011-11-07 MED ORDER — FENTANYL CITRATE 0.05 MG/ML IJ SOLN
INTRAMUSCULAR | Status: DC | PRN
  Administered 2011-11-07: 100 ug via INTRAVENOUS
  Administered 2011-11-07: 50 ug via INTRAVENOUS

## 2011-11-07 MED ORDER — 0.9 % SODIUM CHLORIDE (POUR BTL) OPTIME
TOPICAL | Status: DC | PRN
  Administered 2011-11-07: 1000 mL

## 2011-11-07 MED ORDER — VANCOMYCIN HCL IN DEXTROSE 1-5 GM/200ML-% IV SOLN
INTRAVENOUS | Status: AC
  Administered 2011-11-07: 1000 mg via INTRAVENOUS
  Filled 2011-11-07: qty 200

## 2011-11-07 MED ORDER — LIDOCAINE-EPINEPHRINE (PF) 1 %-1:200000 IJ SOLN
INTRAMUSCULAR | Status: DC | PRN
  Administered 2011-11-07: 17 mL

## 2011-11-07 SURGICAL SUPPLY — 44 items
CANISTER SUCTION 2500CC (MISCELLANEOUS) ×2 IMPLANT
CATH EMB 3FR 80CM (CATHETERS) ×2 IMPLANT
CLIP TI MEDIUM 6 (CLIP) ×2 IMPLANT
CLIP TI WIDE RED SMALL 6 (CLIP) ×2 IMPLANT
CLOTH BEACON ORANGE TIMEOUT ST (SAFETY) ×2 IMPLANT
COVER SURGICAL LIGHT HANDLE (MISCELLANEOUS) ×4 IMPLANT
DECANTER SPIKE VIAL GLASS SM (MISCELLANEOUS) ×2 IMPLANT
DERMABOND ADVANCED (GAUZE/BANDAGES/DRESSINGS) ×1
DERMABOND ADVANCED .7 DNX12 (GAUZE/BANDAGES/DRESSINGS) ×1 IMPLANT
ELECT REM PT RETURN 9FT ADLT (ELECTROSURGICAL) ×2
ELECTRODE REM PT RTRN 9FT ADLT (ELECTROSURGICAL) ×1 IMPLANT
GLOVE BIO SURGEON STRL SZ7 (GLOVE) IMPLANT
GLOVE BIOGEL M 6.5 STRL (GLOVE) ×2 IMPLANT
GLOVE BIOGEL PI IND STRL 7.0 (GLOVE) ×1 IMPLANT
GLOVE BIOGEL PI IND STRL 7.5 (GLOVE) ×1 IMPLANT
GLOVE BIOGEL PI INDICATOR 7.0 (GLOVE) ×1
GLOVE BIOGEL PI INDICATOR 7.5 (GLOVE) ×1
GLOVE SKINSENSE NS SZ7.0 (GLOVE) ×1
GLOVE SKINSENSE STRL SZ7.0 (GLOVE) ×1 IMPLANT
GLOVE SURG SS PI 6.5 STRL IVOR (GLOVE) ×4 IMPLANT
GLOVE SURG SS PI 7.5 STRL IVOR (GLOVE) ×2 IMPLANT
GOWN PREVENTION PLUS XLARGE (GOWN DISPOSABLE) ×2 IMPLANT
GOWN STRL NON-REIN LRG LVL3 (GOWN DISPOSABLE) ×4 IMPLANT
GRAFT GORETEX STND 6X20 (Vascular Products) ×2 IMPLANT
GRAFT GORETEXSTD 6X20 (Vascular Products) ×1 IMPLANT
INSERT FOGARTY SM (MISCELLANEOUS) IMPLANT
KIT BASIN OR (CUSTOM PROCEDURE TRAY) ×2 IMPLANT
KIT ROOM TURNOVER OR (KITS) ×2 IMPLANT
NS IRRIG 1000ML POUR BTL (IV SOLUTION) ×2 IMPLANT
PACK CV ACCESS (CUSTOM PROCEDURE TRAY) ×2 IMPLANT
PAD ARMBOARD 7.5X6 YLW CONV (MISCELLANEOUS) ×4 IMPLANT
SPONGE SURGIFOAM ABS GEL 100 (HEMOSTASIS) IMPLANT
SUT MNCRL AB 4-0 PS2 18 (SUTURE) IMPLANT
SUT PROLENE 5 0 C 1 24 (SUTURE) IMPLANT
SUT PROLENE 6 0 BV (SUTURE) ×4 IMPLANT
SUT PROLENE 7 0 BV 1 (SUTURE) IMPLANT
SUT SILK 2 0 FS (SUTURE) ×2 IMPLANT
SUT VIC AB 3-0 SH 27 (SUTURE) ×2
SUT VIC AB 3-0 SH 27X BRD (SUTURE) ×2 IMPLANT
SYR TB 1ML LUER SLIP (SYRINGE) ×2 IMPLANT
TOWEL OR 17X24 6PK STRL BLUE (TOWEL DISPOSABLE) ×2 IMPLANT
TOWEL OR 17X26 10 PK STRL BLUE (TOWEL DISPOSABLE) ×2 IMPLANT
UNDERPAD 30X30 INCONTINENT (UNDERPADS AND DIAPERS) ×2 IMPLANT
WATER STERILE IRR 1000ML POUR (IV SOLUTION) ×2 IMPLANT

## 2011-11-07 NOTE — Anesthesia Postprocedure Evaluation (Signed)
  Anesthesia Post-op Note  Patient: [name]  Procedure(s) Performed:  INSERTION OF ARTERIOVENOUS (AV) GORE-TEX GRAFT ARM  Patient Location: PACU  Anesthesia Type: MAC  Level of Consciousness: awake  Airway and Oxygen Therapy: Patient Spontanous Breathing  Post-op Pain: mild  Post-op Assessment: Post-op Vital signs reviewed  Post-op Vital Signs: stable  Complications: No apparent anesthesia complications

## 2011-11-07 NOTE — H&P (View-Only) (Signed)
[facility] [place]  Brief Access History and Physical  History of Present Illness  [name] is a 61 y.o. male who presents with chief complaint: thrombosed R FA AVG.  The patient presents today for B arm and central venogram.    Past Medical History  Diagnosis Date  . Hyperthyroidism   . GASTROENTERITIS, VIRAL 10/14/2009  . ONYCHOMYCOSIS, TOENAILS 12/26/2007  . GOITER, MULTINODULAR 12/26/2007  . HYPERTHYROIDISM 02/02/2010  . DIABETES MELLITUS, TYPE II 02/01/2010  . DYSLIPIDEMIA 06/18/2007  . GOUT 06/18/2007  . Hypocalcemia 06/07/2010  . DEMENTIA 09/02/2009  . DEPRESSION 10/14/2009  . PERIPHERAL NEUROPATHY 06/18/2007  . HYPERTENSION 06/18/2007  . CAD, NATIVE VESSEL 02/06/2009  . CONGESTIVE HEART FAILURE 06/18/2007  . DIASTOLIC HEART FAILURE, CHRONIC 02/06/2009  . Unspecified hypotension 01/30/2010  . PULMONARY NODULE, RIGHT LOWER LOBE 06/08/2009  . GERD 06/18/2007  . ESRD 08/04/2010  . BENIGN PROSTATIC HYPERTROPHY 10/14/2009  . GYNECOMASTIA 07/17/2010  . NECK PAIN 07/31/2010  . FOOT PAIN 08/12/2008  . DIZZINESS 07/17/2010  . Other malaise and fatigue 11/24/2009  . GAIT DISTURBANCE 03/03/2010  . DYSPNEA 10/29/2008  . CHEST PAIN 03/29/2010  . TRANSAMINASES, SERUM, ELEVATED 02/01/2010  . COLONIC POLYPS, HX OF 10/14/2009  . Anemia 06/16/2011  . Ischemic cardiomyopathy 06/16/2011  . Hyperparathyroidism, secondary 06/16/2011  . OSA on CPAP 10/16/2011  . Hyperlipidemia 10/16/2011    Past Surgical History  Procedure Date  . Stent 06/11/08   . Neck sugury 2/09  . Back surgury 1998  . Givens capsule study 09/29/2011    Procedure: GIVENS CAPSULE STUDY;  Surgeon: Missy Sabins, MD;  Location: [place];  Service: Endoscopy;  Laterality: Left;  . Esophagogastroduodenoscopy 09/28/2011    Procedure: ESOPHAGOGASTRODUODENOSCOPY (EGD);  Surgeon: Missy Sabins, MD;  Location: [facility] ENDOSCOPY;  Service: Endoscopy;  Laterality: N/A;  . Givens capsule study 09/30/2011    Procedure: GIVENS CAPSULE  STUDY;  Surgeon: Jeryl Columbia, MD;  Location: [facility] ENDOSCOPY;  Service: Endoscopy;  Laterality: N/A;    History   Social History  . Marital Status: Married    Spouse Name: N/A    Number of Children: N/A  . Years of Education: N/A   Occupational History  . disabled   . formerly Lawyer for [employer].    Social History Main Topics  . Smoking status: Former Smoker -- 1.0 packs/day for 25 years    Types: Cigarettes  . Smokeless tobacco: Never Used   Comment: Quit smoking 2007 Smoked x 25 years 1/2 ppd.  . Alcohol Use: No  . Drug Use: No  . Sexually Active: Not on file   Other Topics Concern  . Not on file   Social History Narrative  . No narrative on file    Family History  Problem Relation Age of Onset  . Heart disease Father   . Heart disease Sister     No current facility-administered medications on file prior to encounter.   Current Outpatient Prescriptions on File Prior to Encounter  Medication Sig Dispense Refill  . allopurinol (ZYLOPRIM) 100 MG tablet Take 100 mg by mouth daily.        [name] amitriptyline (ELAVIL) 50 MG tablet Take 50 mg by mouth daily as needed. As needed when feeling depressed.      [name] aspirin 81 MG EC tablet Take 81 mg by mouth daily.        . calcium-vitamin D (OSCAL WITH D) 500-200 MG-UNIT per tablet Take 3 tablets by mouth  3 (three) times daily.       . carvedilol (COREG) 12.5 MG tablet TAKE 1 TABLET TWICE DAILY  180 tablet  1  . cinacalcet (SENSIPAR) 60 MG tablet Take 60 mg by mouth 2 (two) times daily.        [name] gabapentin (NEURONTIN) 300 MG capsule Take 300 mg by mouth at bedtime as needed.        . multivitamin (RENA-VIT) TABS tablet Take 1 tablet by mouth daily.        . Omega-3 Fatty Acids (FISH OIL) 1000 MG CAPS Take by mouth 3 (three) times daily.        . ranitidine (ZANTAC) 300 MG capsule Take 300 mg by mouth daily.        . WELCHOL 3.75 G PACK MIX 1 PACKET IN WATER AND DRINK ONE TIME DAILY  90 each  3    Allergies  Allergen  Reactions  . Cephalexin Other (See Comments)    Tongue swelling  . Statins     REACTION: perceived myalgias    Review of Systems: Kidney Disease, As listed above, otherwise negative.  Physical Examination  Filed Vitals:   10/29/11 1132  BP: 144/87  Pulse: 85  Temp: 98.6 F (37 C)  TempSrc: Oral  Resp: 18  Height: 6\' 1"  (1.854 m)  Weight: 248 lb (112.492 kg)  SpO2: 93%   Body mass index is 32.72 kg/(m^2).  General: A&O x 3, WDWN, obese  Pulmonary: Sym exp, good air movt, CTAB, no rales, rhonchi, & wheezing  Cardiac: RRR, Nl S1, S2, no Murmurs, rubs or gallops  Gastrointestinal: soft, NTND, -G/R, - HSM, - masses, - CVAT B  Musculoskeletal: M/S 5/5 throughout , Extremities without ischemic changes , R forearm AVG thrombosed, L BVT thrombosed  Laboratory See Colburn is a 61 y.o. male who presents with: ESRD requiring evaluation for new access.   The patient is scheduled for: B arm and central venogram.  I discussed with the patient the nature of angiographic procedures, especially the limited patencies of any endovascular intervention.  The patient is aware of that the risks of an angiographic procedure include but are not limited to: bleeding, infection, access site complications, renal failure, embolization, rupture of vessel, dissection, possible need for emergent surgical intervention, possible need for surgical procedures to treat the patient's pathology, and stroke and death.    The patient is aware of the risks and agrees to proceed.  Adele Barthel, MD [facility] [place] Office: [phone] Pager: [phone]  10/29/2011, 12:02 PM

## 2011-11-07 NOTE — Anesthesia Preprocedure Evaluation (Addendum)
Anesthesia Evaluation  Patient identified by MRN, date of birth, ID band Patient awake    Airway Mallampati: II      Dental   Pulmonary shortness of breath and with exertion, sleep apnea and Continuous Positive Airway Pressure Ventilation ,  clear to auscultation        Cardiovascular hypertension, + CAD and +CHF Regular Normal    Neuro/Psych PSYCHIATRIC DISORDERS Anxiety    GI/Hepatic Neg liver ROS, GERD-  ,  Endo/Other  Diabetes mellitus-Hyperthyroidism   Renal/GU      Musculoskeletal negative musculoskeletal ROS (+)   Abdominal   Peds  Hematology negative hematology ROS (+)   Anesthesia Other Findings   Reproductive/Obstetrics                           Anesthesia Physical Anesthesia Plan  ASA: III  Anesthesia Plan: MAC   Post-op Pain Management:    Induction: Intravenous  Airway Management Planned: LMA  Additional Equipment:   Intra-op Plan:   Post-operative Plan:   Informed Consent: I have reviewed the patients History and Physical, chart, labs and discussed the procedure including the risks, benefits and alternatives for the proposed anesthesia with the patient or authorized representative who has indicated his/her understanding and acceptance.     Plan Discussed with: CRNA  Anesthesia Plan Comments:        Anesthesia Quick Evaluation

## 2011-11-07 NOTE — Op Note (Signed)
OPERATIVE REPORT  Date of Surgery: 11/07/2011  Surgeon: Tinnie Gens, MD  Assistant: Wray Kearns, PA  Pre-op Diagnosis: end stage renal disease  Post-op Diagnosis: end stage renal disease  Procedure: Procedure(s): INSERTION OF ARTERIOVENOUS (AV) GORE-TEX GRAFT ARMleft upper extremity from brachial artery to axillary vein-6 mm Gore-Tex  Anesthesia: LMA  EBL: Minimal  Complications: None  Procedure Details: The patient was taken to the operating room placed in the supine position at which time the left upper extremity was prepped with Betadine scrub and solution and draped in a routine sterile fashion after induction of satisfactory LMA anesthesia. After infiltration with 1% Xylocaine a short longitudinal incision was made through the previous scar near the axilla where a basilic vein transposition had been performed. The vein was identified and was sclerotic distally but became patent as it approached the axillary vein it was dissected free up to the exit of. It was 5 mm in size. Second incision was made in the distal upper arm longitudinally and the brachial artery was exposed and encircled with vessel loops. An excellent pulse. A subcutaneous tunnel was then created and a 6 mm Gore-Tex graft delivered through the tunnel. No heparin was used. Brachial artery was occluded proximally and distally with vessel loops opened with a 15 blade extended with a Potts scissors. An excellent inflow. The Gore-Tex was anastomosed end to side with 6-0 Prolene. Clamps were then released attention turned to the axillary wound where the vein was ligated distally transected spatulated and was 5 mm in size. An end-to-end anastomosis was done between graft and vein with 6-0 Prolene. Following completion there was an excellent pulse and thrill in the graft and good radial arterial flow with the graft open which improved with compression of the graft. Adequate hemostasis was achieved and wounds were closed in layers  with Vicryl in a subcuticular fashion with Dermabond patient to the recovery room in stable condition   Tinnie Gens, MD 11/07/2011 10:15 AM

## 2011-11-07 NOTE — Interval H&P Note (Signed)
History and Physical Interval Note:  11/07/2011 8:27 AM  [name]  has presented today for surgery, with the diagnosis of ESRD  The various methods of treatment have been discussed with the patient and family. After consideration of risks, benefits and other options for treatment, the patient has consented to  Procedure(s): INSERTION OF ARTERIOVENOUS (AV) GORE-TEX GRAFT ARM as a surgical intervention .  The patients' history has been reviewed, patient examined, no change in status, stable for surgery.  I have reviewed the patients' chart and labs.  Questions were answered to the patient's satisfaction.     Tinnie Gens

## 2011-11-07 NOTE — Anesthesia Postprocedure Evaluation (Signed)
  Anesthesia Post-op Note  Patient: [name]  Procedure(s) Performed:  INSERTION OF ARTERIOVENOUS (AV) GORE-TEX GRAFT ARM  Patient Location: PACU  Anesthesia Type: General  Level of Consciousness: awake  Airway and Oxygen Therapy: Patient Spontanous Breathing  Post-op Pain: mild  Post-op Assessment: Post-op Vital signs reviewed  Post-op Vital Signs: stable  Complications: No apparent anesthesia complications

## 2011-11-07 NOTE — Transfer of Care (Signed)
Immediate Anesthesia Transfer of Care Note  Patient: [name]  Procedure(s) Performed:  INSERTION OF ARTERIOVENOUS (AV) GORE-TEX GRAFT ARM  Patient Location: PACU  Anesthesia Type: General  Level of Consciousness: awake and sedated  Airway & Oxygen Therapy: Patient Spontanous Breathing and Patient connected to face mask oxygen  Post-op Assessment: Report given to PACU RN and Post -op Vital signs reviewed and stable  Post vital signs: Reviewed and stable  Complications: No apparent anesthesia complications

## 2011-11-08 LAB — POCT I-STAT 4, (NA,K, GLUC, HGB,HCT): Potassium: 4.9 mEq/L (ref 3.5–5.1)

## 2011-11-09 ENCOUNTER — Encounter (HOSPITAL_COMMUNITY): Payer: Self-pay | Admitting: Vascular Surgery

## 2011-12-10 ENCOUNTER — Other Ambulatory Visit: Payer: Self-pay | Admitting: *Deleted

## 2011-12-14 ENCOUNTER — Encounter (HOSPITAL_COMMUNITY): Payer: Medicare HMO

## 2011-12-17 ENCOUNTER — Other Ambulatory Visit: Payer: Self-pay | Admitting: Physician Assistant

## 2011-12-17 ENCOUNTER — Ambulatory Visit (HOSPITAL_COMMUNITY)
Admission: RE | Admit: 2011-12-17 | Discharge: 2011-12-17 | Disposition: A | Discharge status: Home / Self Care | Payer: Medicare HMO | Source: Ambulatory Visit | Attending: Vascular Surgery | Admitting: Vascular Surgery

## 2011-12-17 DIAGNOSIS — N186 End stage renal disease: Secondary | ICD-10-CM

## 2011-12-17 DIAGNOSIS — Z452 Encounter for adjustment and management of vascular access device: Secondary | ICD-10-CM | POA: Insufficient documentation

## 2011-12-17 MED ORDER — LIDOCAINE HCL (PF) 1 % IJ SOLN
INTRAMUSCULAR | Status: AC
  Filled 2011-12-17: qty 5

## 2011-12-17 NOTE — Progress Notes (Signed)
[facility] SHORT STAY H&P  CC: ESRD   HPI: [name] is a 61 y.o. male who has been on HD for a year. Pt. Has functioning Hemodialysis access in the left upper extremity. They are here for HD catheter removal. Pt. denies signs of steal syndrome.  Past Medical History  Diagnosis Date  . Hyperthyroidism   . GASTROENTERITIS, VIRAL 10/14/2009  . ONYCHOMYCOSIS, TOENAILS 12/26/2007  . GOITER, MULTINODULAR 12/26/2007  . HYPERTHYROIDISM 02/02/2010  . DIABETES MELLITUS, TYPE II 02/01/2010  . DYSLIPIDEMIA 06/18/2007  . GOUT 06/18/2007  . Hypocalcemia 06/07/2010  . DEMENTIA 09/02/2009  . DEPRESSION 10/14/2009  . PERIPHERAL NEUROPATHY 06/18/2007  . HYPERTENSION 06/18/2007  . CONGESTIVE HEART FAILURE 06/18/2007  . DIASTOLIC HEART FAILURE, CHRONIC 02/06/2009  . Unspecified hypotension 01/30/2010  . PULMONARY NODULE, RIGHT LOWER LOBE 06/08/2009  . GERD 06/18/2007  . ESRD 08/04/2010  . BENIGN PROSTATIC HYPERTROPHY 10/14/2009  . GYNECOMASTIA 07/17/2010  . NECK PAIN 07/31/2010  . FOOT PAIN 08/12/2008  . DIZZINESS 07/17/2010  . Other malaise and fatigue 11/24/2009  . GAIT DISTURBANCE 03/03/2010  . DYSPNEA 10/29/2008  . CHEST PAIN 03/29/2010  . TRANSAMINASES, SERUM, ELEVATED 02/01/2010  . COLONIC POLYPS, HX OF 10/14/2009  . Anemia 06/16/2011  . Ischemic cardiomyopathy 06/16/2011  . Hyperparathyroidism, secondary 06/16/2011  . OSA on CPAP 10/16/2011  . Hyperlipidemia 10/16/2011  . CAD, NATIVE VESSEL 02/06/2009    saw Dr. Missy Sabins last jan 2013  . Sleep apnea     cpap machine and o2    FH:  Non-Contributory  Social HX History  Substance Use Topics  . Smoking status: Former Smoker -- 1.0 packs/day for 25 years    Types: Cigarettes  . Smokeless tobacco: Never Used   Comment: Quit smoking 2007 Smoked x 25 years 1/2 ppd.  . Alcohol Use: Yes     occassional     Allergies Allergies  Allergen Reactions  . Cephalexin Other (See Comments)    Tongue swelling  . Statins     REACTION: perceived  myalgias    Medications Current Outpatient Prescriptions  Medication Sig Dispense Refill  . allopurinol (ZYLOPRIM) 100 MG tablet Take 100 mg by mouth daily.        [name] amitriptyline (ELAVIL) 50 MG tablet Take 50 mg by mouth daily as needed. As needed when feeling depressed.      [name] aspirin 81 MG EC tablet Take 81 mg by mouth daily.        . calcium-vitamin D (OSCAL WITH D) 500-200 MG-UNIT per tablet Take 3 tablets by mouth 3 (three) times daily.       . carvedilol (COREG) 12.5 MG tablet Take 12.5 mg by mouth 2 (two) times daily with a meal.      . cinacalcet (SENSIPAR) 60 MG tablet Take 60 mg by mouth 2 (two) times daily.        . Colesevelam HCl (WELCHOL) 3.75 G PACK Take 3.75 g by mouth daily.      [name] gabapentin (NEURONTIN) 300 MG capsule Take 300 mg by mouth at bedtime as needed.        . multivitamin (RENA-VIT) TABS tablet Take 1 tablet by mouth daily.        . Omega-3 Fatty Acids (FISH OIL) 1000 MG CAPS Take by mouth 3 (three) times daily.        . ranitidine (ZANTAC) 300 MG capsule Take 300 mg by mouth daily.         No current facility-administered  medications for this visit.   Facility-Administered Medications Ordered in Other Visits  Medication Dose Route Frequency Provider Last Rate Last Dose  . lidocaine (XYLOCAINE) 1 % injection             Labs   PHYSICAL EXAM  There were no vitals filed for this visit.  General:  WDWN in NAD HENT: WNL Eyes: Pupils equal Pulmonary: normal non-labored breathing  Cardiac: RRR, Skin: normal, no cyanosis, jaundice, pallor or bruising Vascular Exam/Pulses: 2+  pulses in RIGHT upper extremity. Extremities without ischemic changes, no Gangrene , no cellulitis; no open wounds;   There is a good  thrill and good  bruit in the left upper arm. Hand grip is 5/5 and sensation in digits is intact;   Impression: This is a 61 y.o. male who has a functioning HD access.  Plan: Removal of Left IJ HD catheter Theda Sers, Ellisa Devivo  [facility] 12/17/2011 12:40 PM

## 2011-12-17 NOTE — Progress Notes (Signed)
Pt and spouse voice understanding of d/c instructions.  Dressing to right upper chest clean dry and intact.  No voiced complaints.

## 2011-12-17 NOTE — Progress Notes (Signed)
[facility] Catheter Removal Procedure Note  Diagnosis: ESRD with Functioning AVF/AVGG  Plan:  Remove left diatek catheter  Consent signed:  yes Time out completed:  yes Coumadin:  no PT/INR (if applicable):   Other labs:   Procedure: 1.  Sterile prepping and draping over catheter area 2. 10 ml 2% lidocaine plain instilled at removal site. 3.  left catheter removed in its entirety with cuff in tact. 4.  Complications: one simple stitch at cuff cut down site was placed. 5. Tip of catheter sent for culture:  no   Patient tolerated procedure well:  yes Pressure held, no bleeding noted, dressing applied Instructions given to the pt regarding wound care and bleeding.  Other: Have stitch removed at dialysis in one week. [facility]  12/17/2011 12:43 PM

## 2011-12-25 ENCOUNTER — Ambulatory Visit: Payer: Medicare HMO | Admitting: Internal Medicine

## 2012-02-26 IMAGING — CR DG LUMBAR SPINE COMPLETE 4+V
5 series · 5 of 5 positions shown · non-contrast
Comparison: 08/12/2010.

CLINICAL DATA: Chronic mid to lower back pain.  Lumbar fusion 5
years ago.

LUMBAR SPINE - COMPLETE 4+ VIEW

[t l-spine a.p. *]
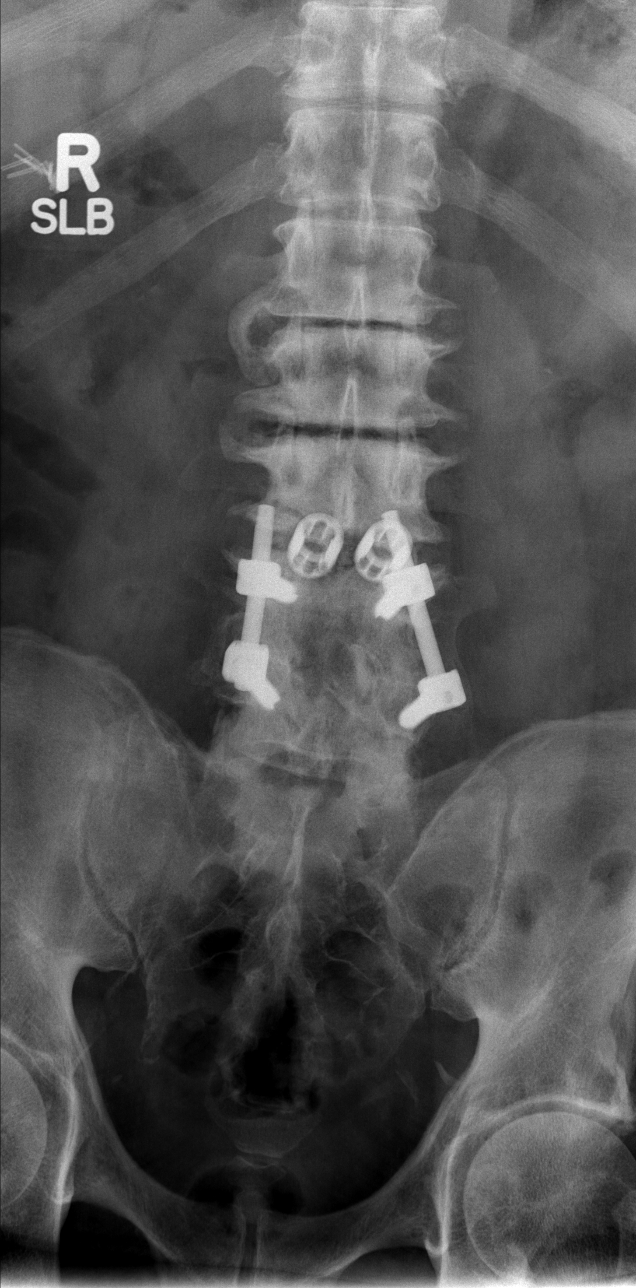

[t l-spine oblique exposure *]
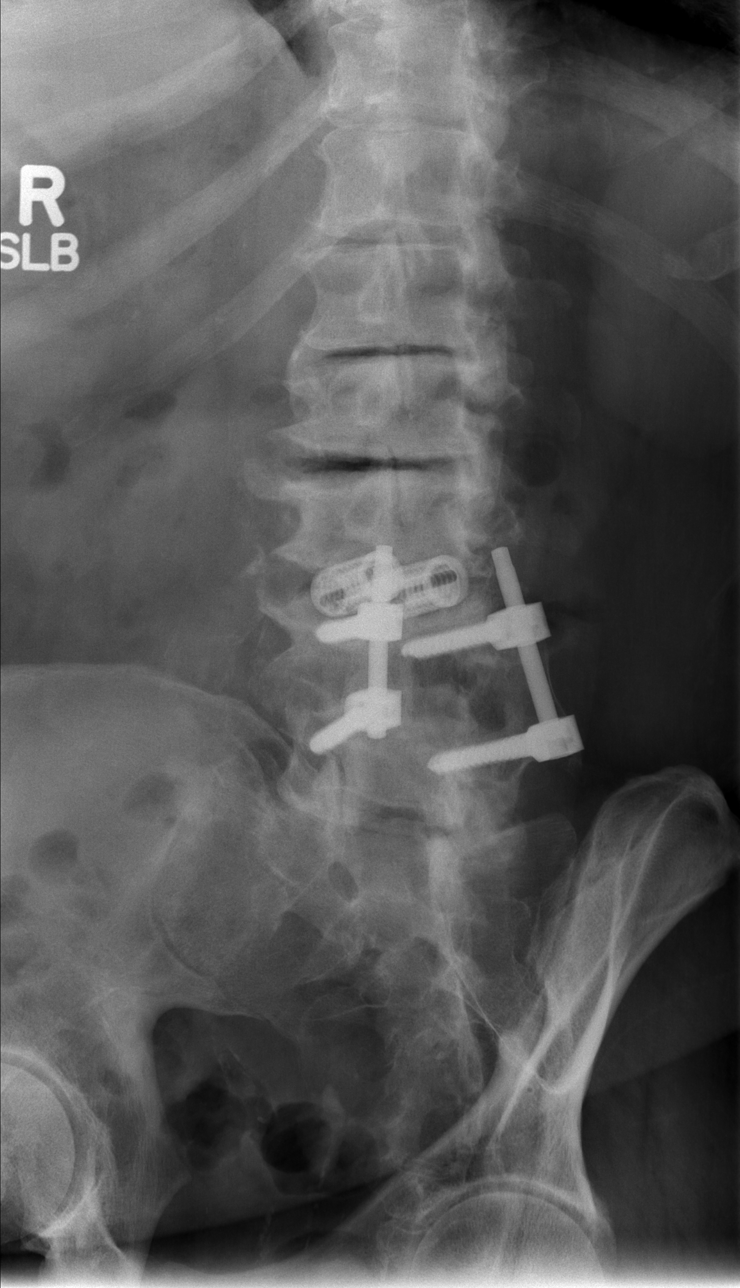

[t l-spine oblique exposure]
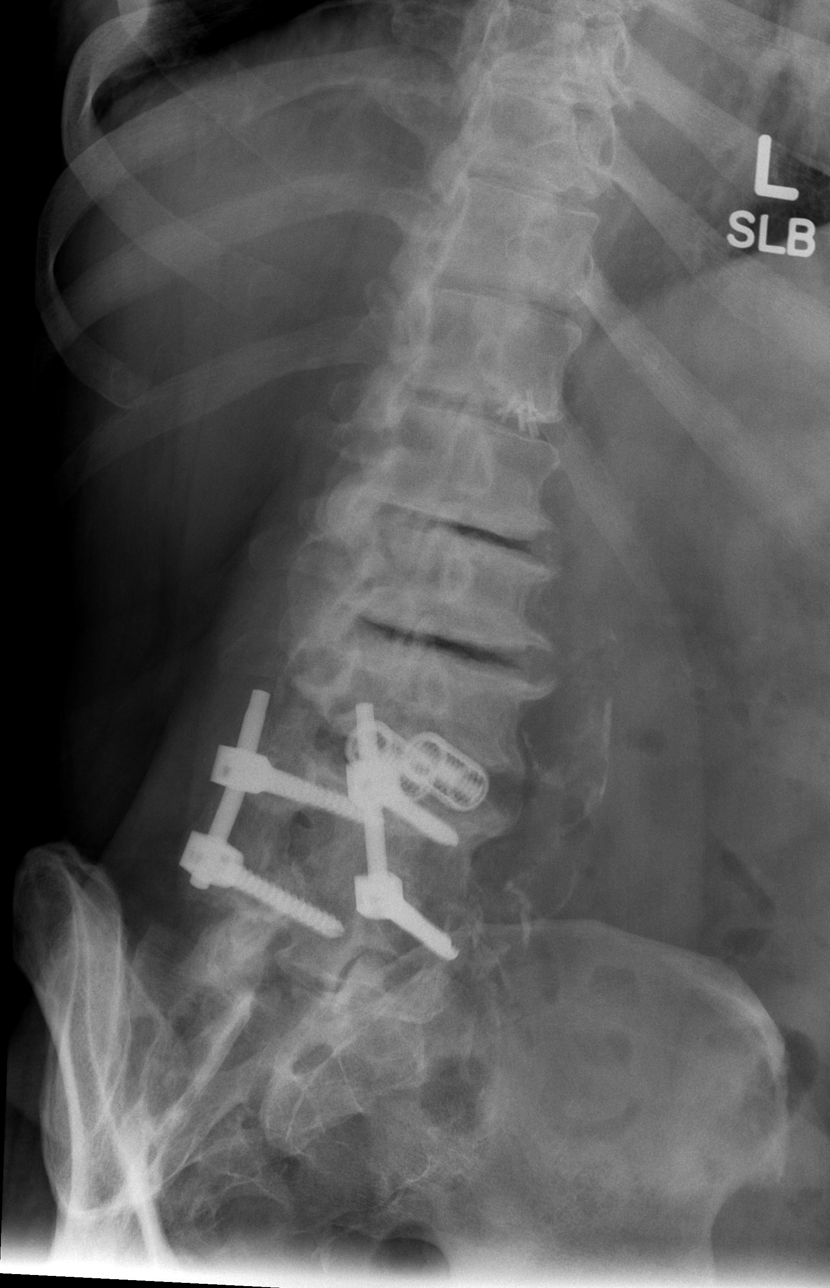

[t l-spine lat]
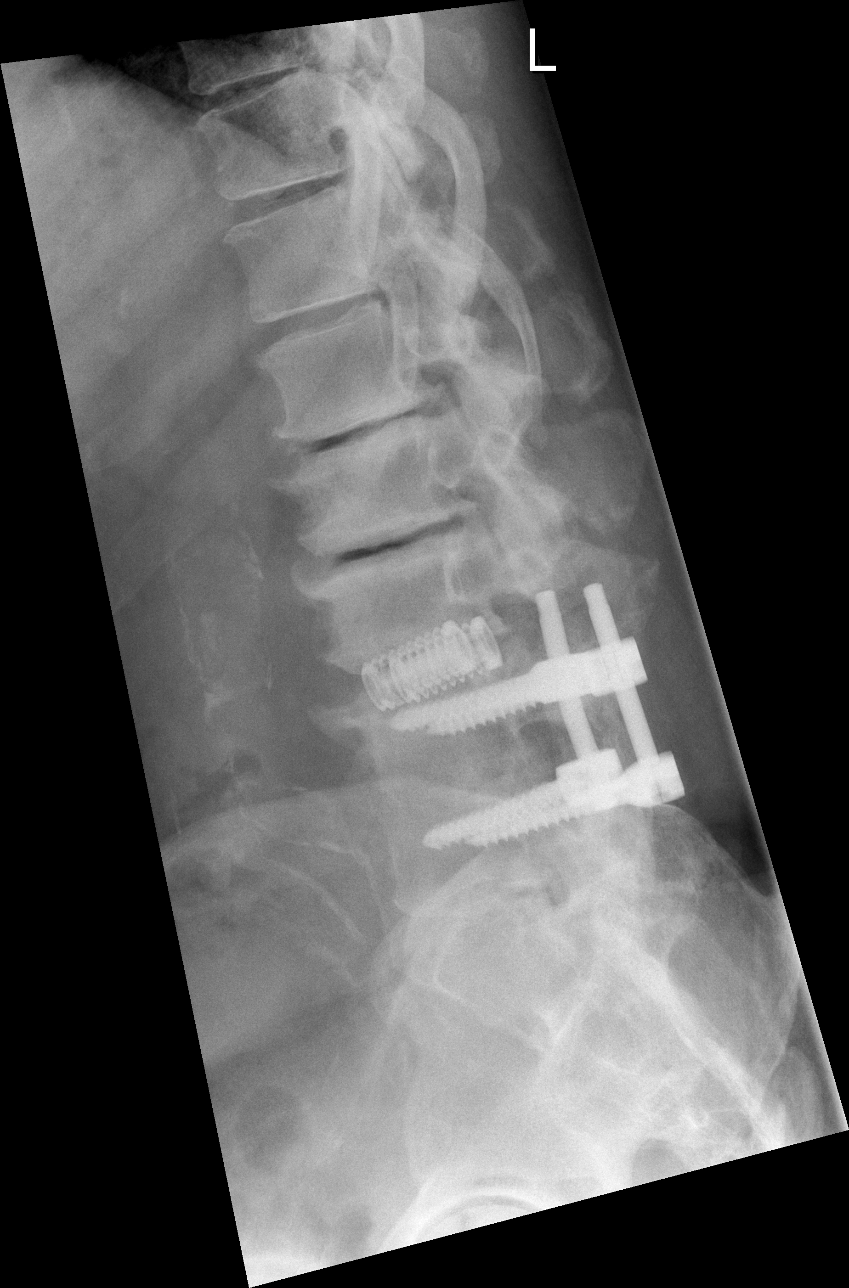

[t l-spine l5-s1 spot]
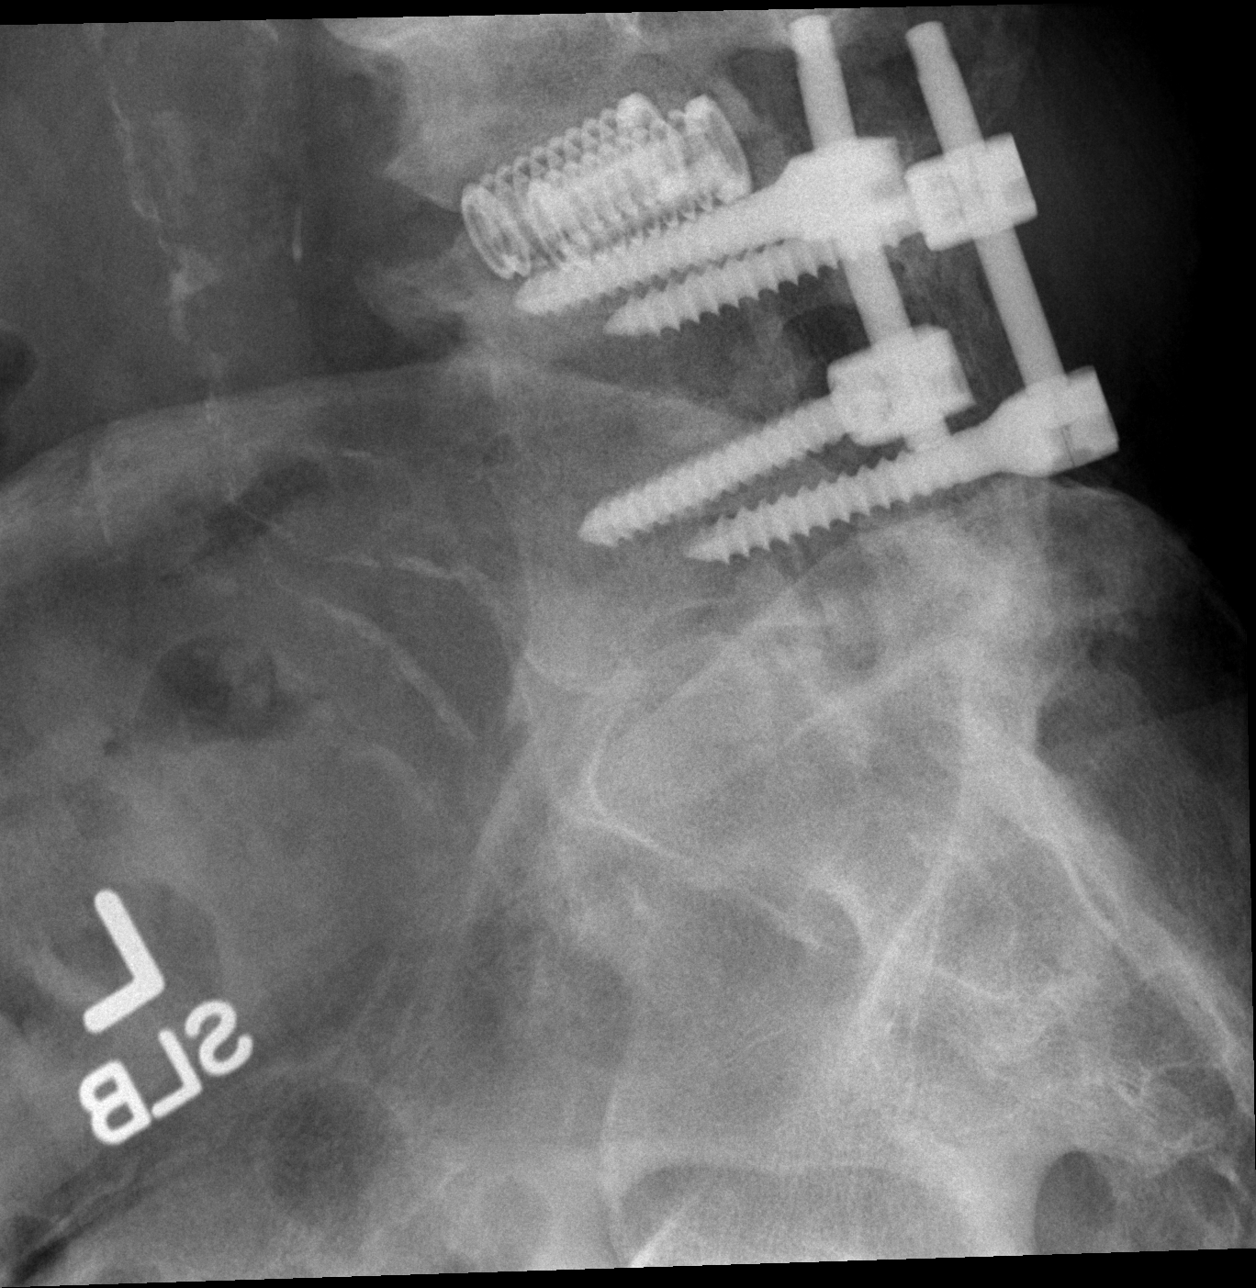

[5 of 5 positions shown; findings below may reference images not displayed]

FINDINGS: L4-L5 posterior lumbar interbody fusion is present with
discectomy and ray cage grafts at L3-L4.  Vacuum disc is present at
L1-L2 and L2-L3 with prominent osteophytes.  No hardware
disconnection or failure is identified.  L5-S1 facet arthrosis.
Vertebral body height is preserved.  Fusion at L4-L5 appears solid.
Aortoiliac atherosclerosis is present.  There is straightening of
the normal lumbar lordosis on the lateral view.  Lumbosacral
junction grossly appears within normal limits.  The lumbosacral
dedicated view is oblique.
IMPRESSION: 1.  L4-L5 posterior lumbar interbody fusion.  Fusion is complete.
2.  L3-L4 ray cage fusion grafts.
3.  Moderate to severe lumbar spondylosis at L1-L2 and L2-L3.

## 2012-03-28 ENCOUNTER — Ambulatory Visit (INDEPENDENT_AMBULATORY_CARE_PROVIDER_SITE_OTHER): Payer: Medicare HMO | Admitting: Endocrinology

## 2012-03-28 ENCOUNTER — Other Ambulatory Visit (INDEPENDENT_AMBULATORY_CARE_PROVIDER_SITE_OTHER): Payer: Medicare HMO

## 2012-03-28 ENCOUNTER — Encounter: Payer: Self-pay | Admitting: Endocrinology

## 2012-03-28 ENCOUNTER — Encounter: Payer: Self-pay | Admitting: Internal Medicine

## 2012-03-28 ENCOUNTER — Ambulatory Visit (INDEPENDENT_AMBULATORY_CARE_PROVIDER_SITE_OTHER): Payer: Medicare HMO | Admitting: Internal Medicine

## 2012-03-28 VITALS — BP 120/80 | HR 92 | Temp 97.6°F | Ht 73.0 in | Wt 250.5 lb

## 2012-03-28 VITALS — BP 120/80 | HR 92 | Temp 97.3°F | Wt 250.0 lb

## 2012-03-28 DIAGNOSIS — R1310 Dysphagia, unspecified: Secondary | ICD-10-CM

## 2012-03-28 DIAGNOSIS — E291 Testicular hypofunction: Secondary | ICD-10-CM

## 2012-03-28 DIAGNOSIS — M79609 Pain in unspecified limb: Secondary | ICD-10-CM

## 2012-03-28 DIAGNOSIS — M79641 Pain in right hand: Secondary | ICD-10-CM | POA: Insufficient documentation

## 2012-03-28 LAB — TSH: TSH: 1.08 u[IU]/mL (ref 0.35–5.50)

## 2012-03-28 MED ORDER — OMEPRAZOLE 20 MG PO CPDR: 20.0000 mg | DELAYED_RELEASE_CAPSULE | Freq: Every day | ORAL | Status: DC

## 2012-03-28 NOTE — Patient Instructions (Addendum)
Ok to stop the zantac Start the Prilosec 20 mg per day Continue all other medications as before We will have you see Dr Loanne Drilling today (no charge for me) You will be contacted regarding the referral for: Hand Surgury for right hand Please see Dr Gillermina Hu if the swallowing problem does not improve

## 2012-03-28 NOTE — Progress Notes (Signed)
Subjective:    Patient ID: [name], male    DOB: 03/28/1951, 61 y.o.   MRN: [id number]  HPI The state of at least three ongoing medical problems is addressed today: Hyperthyroidism: he takes tapazole as rx'ed.  He has fatigue. Hypogonadism was noted at dialysis.  He has generalized muscle weakness. ED: pt says this persists. Past Medical History  Diagnosis Date  . Hyperthyroidism   . GASTROENTERITIS, VIRAL 10/14/2009  . ONYCHOMYCOSIS, TOENAILS 12/26/2007  . GOITER, MULTINODULAR 12/26/2007  . HYPERTHYROIDISM 02/02/2010  . DIABETES MELLITUS, TYPE II 02/01/2010  . DYSLIPIDEMIA 06/18/2007  . GOUT 06/18/2007  . Hypocalcemia 06/07/2010  . DEMENTIA 09/02/2009  . DEPRESSION 10/14/2009  . PERIPHERAL NEUROPATHY 06/18/2007  . HYPERTENSION 06/18/2007  . CONGESTIVE HEART FAILURE 06/18/2007  . DIASTOLIC HEART FAILURE, CHRONIC 02/06/2009  . Unspecified hypotension 01/30/2010  . PULMONARY NODULE, RIGHT LOWER LOBE 06/08/2009  . GERD 06/18/2007  . ESRD 08/04/2010  . BENIGN PROSTATIC HYPERTROPHY 10/14/2009  . GYNECOMASTIA 07/17/2010  . NECK PAIN 07/31/2010  . FOOT PAIN 08/12/2008  . DIZZINESS 07/17/2010  . Other malaise and fatigue 11/24/2009  . GAIT DISTURBANCE 03/03/2010  . DYSPNEA 10/29/2008  . CHEST PAIN 03/29/2010  . TRANSAMINASES, SERUM, ELEVATED 02/01/2010  . COLONIC POLYPS, HX OF 10/14/2009  . Anemia 06/16/2011  . Ischemic cardiomyopathy 06/16/2011  . Hyperparathyroidism, secondary 06/16/2011  . OSA on CPAP 10/16/2011  . Hyperlipidemia 10/16/2011  . CAD, NATIVE VESSEL 02/06/2009    saw Dr. Missy Sabins last jan 2013  . Sleep apnea     cpap machine and o2    Past Surgical History  Procedure Date  . Stent 06/11/08   . Neck sugury 2/09  . Back surgury 1998  . Givens capsule study 09/29/2011    Procedure: GIVENS CAPSULE STUDY;  Surgeon: Missy Sabins, MD;  Location: [place];  Service: Endoscopy;  Laterality: Left;  . Esophagogastroduodenoscopy 09/28/2011    Procedure: ESOPHAGOGASTRODUODENOSCOPY (EGD);   Surgeon: Missy Sabins, MD;  Location: [facility] ENDOSCOPY;  Service: Endoscopy;  Laterality: N/A;  . Givens capsule study 09/30/2011    Procedure: GIVENS CAPSULE STUDY;  Surgeon: Jeryl Columbia, MD;  Location: [facility] ENDOSCOPY;  Service: Endoscopy;  Laterality: N/A;  . Cholecystectomy   . Cardiac catheterization   . Tonsillectomy   . Av fistula placement 11/07/2011    Procedure: INSERTION OF ARTERIOVENOUS (AV) GORE-TEX GRAFT ARM;  Surgeon: Tinnie Gens, MD;  Location: [place];  Service: Vascular;  Laterality: Left;    History   Social History  . Marital Status: Married    Spouse Name: N/A    Number of Children: N/A  . Years of Education: N/A   Occupational History  . disabled   . formerly Lawyer for [employer].    Social History Main Topics  . Smoking status: Former Smoker -- 1.0 packs/day for 25 years    Types: Cigarettes  . Smokeless tobacco: Never Used   Comment: Quit smoking 2007 Smoked x 25 years 1/2 ppd.  . Alcohol Use: Yes     occassional   . Drug Use: No  . Sexually Active: Not on file   Other Topics Concern  . Not on file   Social History Narrative  . No narrative on file    Current Outpatient Prescriptions on File Prior to Visit  Medication Sig Dispense Refill  . allopurinol (ZYLOPRIM) 100 MG tablet Take 100 mg by mouth daily.        [name] amitriptyline (ELAVIL) 50 MG tablet Take 50  mg by mouth daily as needed. As needed when feeling depressed.      [name] aspirin 81 MG EC tablet Take 81 mg by mouth daily.        . calcium-vitamin D (OSCAL WITH D) 500-200 MG-UNIT per tablet Take 3 tablets by mouth 3 (three) times daily.       . carvedilol (COREG) 12.5 MG tablet Take 12.5 mg by mouth 2 (two) times daily with a meal.      . cinacalcet (SENSIPAR) 60 MG tablet Take 60 mg by mouth 2 (two) times daily.        . Colesevelam HCl (WELCHOL) 3.75 G PACK Take 3.75 g by mouth daily.      [name] gabapentin (NEURONTIN) 300 MG capsule Take 300 mg by mouth at bedtime as needed.        .  multivitamin (RENA-VIT) TABS tablet Take 1 tablet by mouth daily.        . Omega-3 Fatty Acids (FISH OIL) 1000 MG CAPS Take by mouth 3 (three) times daily.        . ranitidine (ZANTAC) 300 MG capsule Take 300 mg by mouth daily.        [name] omeprazole (PRILOSEC) 20 MG capsule Take 1 capsule (20 mg total) by mouth daily.  90 capsule  3    Allergies  Allergen Reactions  . Cephalexin Other (See Comments)    Tongue swelling  . Statins     REACTION: perceived myalgias    Family History  Problem Relation Age of Onset  . Heart disease Father   . Heart disease Sister     BP 120/80  Pulse 92  Temp 97.3 F (36.3 C) (Oral)  Wt 250 lb (113.399 kg)  SpO2 98%   Review of Systems Denies fever and weight change    Objective:   Physical Exam VITAL SIGNS:  See vs page GENERAL: no distress NECK: There is no palpable thyroid enlargement.  No thyroid nodule is palpable.  No palpable lymphadenopathy at the anterior neck. GENITALIA:  Normal male testicles, scrotum, and penis   outside test results are reviewed: Testosterone=206 Lab Results  Component Value Date   TSH 1.08 03/28/2012   T4TOTAL 5.6 07/19/2008      Assessment & Plan:  Hyperthyroidism, well-controlled Hypogonadism, new ED, persistent, possibly due to hypogonadism

## 2012-03-28 NOTE — Patient Instructions (Addendum)
blood tests are being requested for you today.  You will receive a letter with results. Based on the result, i may be able to prescribe for you a pill to increase the testosterone normalization of testosterone is not known to harm you.  however, there are "theoretical" risks, including increased fertility, hair loss, prostate cancer, benign prostate enlargement, blood clots, liver problems, lower hdl ("good cholesterol"), sleep apnea, and behavior changes

## 2012-03-29 ENCOUNTER — Encounter: Payer: Self-pay | Admitting: Internal Medicine

## 2012-03-29 NOTE — Assessment & Plan Note (Signed)
Exam ok , but suspect CTS - for hand surgury referral

## 2012-03-29 NOTE — Assessment & Plan Note (Signed)
To change the zantac to prilosec, for f/u GI if not improved 1-2 wks

## 2012-03-29 NOTE — Progress Notes (Signed)
Subjective:    Patient ID: [name], male    DOB: 12/04/1950, 61 y.o.   MRN: [id number]  HPI  Here with wife; was inadvertently placed on my schedule today in the office, when he was intended to be referred and be seen per Dr Loanne Drilling after low testosterone found per nephrology/[facility]. This will be arranged, but while here does c/o burning type discomfort ongoing for several months to the right hand, worse to the index finger without red, swelling/tender/weakness.  Also states has occasional reflux symptoms but new onset mild dysphagia assoc with an episode of vomiting with choke and slight cough for a day after food seemed to be caught.  Has been seen per GI, Dr Paulita Fujita, with egd per wife approx 6 mo ago.   Past Medical History  Diagnosis Date  . Hyperthyroidism   . GASTROENTERITIS, VIRAL 10/14/2009  . ONYCHOMYCOSIS, TOENAILS 12/26/2007  . GOITER, MULTINODULAR 12/26/2007  . HYPERTHYROIDISM 02/02/2010  . DIABETES MELLITUS, TYPE II 02/01/2010  . DYSLIPIDEMIA 06/18/2007  . GOUT 06/18/2007  . Hypocalcemia 06/07/2010  . DEMENTIA 09/02/2009  . DEPRESSION 10/14/2009  . PERIPHERAL NEUROPATHY 06/18/2007  . HYPERTENSION 06/18/2007  . CONGESTIVE HEART FAILURE 06/18/2007  . DIASTOLIC HEART FAILURE, CHRONIC 02/06/2009  . Unspecified hypotension 01/30/2010  . PULMONARY NODULE, RIGHT LOWER LOBE 06/08/2009  . GERD 06/18/2007  . ESRD 08/04/2010  . BENIGN PROSTATIC HYPERTROPHY 10/14/2009  . GYNECOMASTIA 07/17/2010  . NECK PAIN 07/31/2010  . FOOT PAIN 08/12/2008  . DIZZINESS 07/17/2010  . Other malaise and fatigue 11/24/2009  . GAIT DISTURBANCE 03/03/2010  . DYSPNEA 10/29/2008  . CHEST PAIN 03/29/2010  . TRANSAMINASES, SERUM, ELEVATED 02/01/2010  . COLONIC POLYPS, HX OF 10/14/2009  . Anemia 06/16/2011  . Ischemic cardiomyopathy 06/16/2011  . Hyperparathyroidism, secondary 06/16/2011  . OSA on CPAP 10/16/2011  . Hyperlipidemia 10/16/2011  . CAD, NATIVE VESSEL 02/06/2009    saw Dr. Missy Sabins last jan 2013  . Sleep apnea    cpap machine and o2   Past Surgical History  Procedure Date  . Stent 06/11/08   . Neck sugury 2/09  . Back surgury 1998  . Givens capsule study 09/29/2011    Procedure: GIVENS CAPSULE STUDY;  Surgeon: Missy Sabins, MD;  Location: [place];  Service: Endoscopy;  Laterality: Left;  . Esophagogastroduodenoscopy 09/28/2011    Procedure: ESOPHAGOGASTRODUODENOSCOPY (EGD);  Surgeon: Missy Sabins, MD;  Location: [facility] ENDOSCOPY;  Service: Endoscopy;  Laterality: N/A;  . Givens capsule study 09/30/2011    Procedure: GIVENS CAPSULE STUDY;  Surgeon: Jeryl Columbia, MD;  Location: [facility] ENDOSCOPY;  Service: Endoscopy;  Laterality: N/A;  . Cholecystectomy   . Cardiac catheterization   . Tonsillectomy   . Av fistula placement 11/07/2011    Procedure: INSERTION OF ARTERIOVENOUS (AV) GORE-TEX GRAFT ARM;  Surgeon: Tinnie Gens, MD;  Location: [place];  Service: Vascular;  Laterality: Left;    reports that he has quit smoking. His smoking use included Cigarettes. He has a 25 pack-year smoking history. He has never used smokeless tobacco. He reports that he drinks alcohol. He reports that he does not use illicit drugs. family history includes Heart disease in his father and sister. Allergies  Allergen Reactions  . Cephalexin Other (See Comments)    Tongue swelling  . Statins     REACTION: perceived myalgias   Current Outpatient Prescriptions on File Prior to Visit  Medication Sig Dispense Refill  . allopurinol (ZYLOPRIM) 100 MG tablet Take 100 mg by mouth daily.        [name]  amitriptyline (ELAVIL) 50 MG tablet Take 50 mg by mouth daily as needed. As needed when feeling depressed.      [name] aspirin 81 MG EC tablet Take 81 mg by mouth daily.        . calcium-vitamin D (OSCAL WITH D) 500-200 MG-UNIT per tablet Take 3 tablets by mouth 3 (three) times daily.       . carvedilol (COREG) 12.5 MG tablet Take 12.5 mg by mouth 2 (two) times daily with a meal.      . cinacalcet (SENSIPAR) 60 MG tablet Take 60 mg by mouth 2  (two) times daily.        . Colesevelam HCl (WELCHOL) 3.75 G PACK Take 3.75 g by mouth daily.      [name] gabapentin (NEURONTIN) 300 MG capsule Take 300 mg by mouth at bedtime as needed.        . multivitamin (RENA-VIT) TABS tablet Take 1 tablet by mouth daily.        . Omega-3 Fatty Acids (FISH OIL) 1000 MG CAPS Take by mouth 3 (three) times daily.        . ranitidine (ZANTAC) 300 MG capsule Take 300 mg by mouth daily.        [name] omeprazole (PRILOSEC) 20 MG capsule Take 1 capsule (20 mg total) by mouth daily.  90 capsule  3   Review of Systems Not done in detail    Objective:   Physical Exam BP 120/80  Pulse 92  Temp 97.6 F (36.4 C) (Oral)  Ht 6\' 1"  (1.854 m)  Wt 250 lb 8 oz (113.626 kg)  BMI 33.05 kg/m2  SpO2 98% Physical Exam  VS noted Constitutional: Pt appears well-developed and well-nourished.  HENT: Head: Normocephalic. Eyes: Conjunctivae and EOM are normal. Pupils are equal, round, and reactive to light.  Neck: Normal range of motion. Neck supple.  Cardiovascular: Normal rate and regular rhythm.   Pulmonary/Chest: Effort normal and breath sounds normal.  Abd: soft. NT +BS Neurological: Pt is alert. RUE motor/grip intact, sens intact to LT Skin: Skin is warm. No erythema.  Psychiatric: Pt behavior is normal. Thought content normal.     Assessment & Plan:

## 2012-03-31 ENCOUNTER — Encounter: Payer: Self-pay | Admitting: Endocrinology

## 2012-03-31 LAB — LUTEINIZING HORMONE: LH: 5.4 m[IU]/mL (ref 1.50–9.30)

## 2012-03-31 MED ORDER — CLOMIPHENE CITRATE 50 MG PO TABS: ORAL_TABLET | ORAL | Status: DC

## 2012-04-01 ENCOUNTER — Telehealth: Payer: Self-pay | Admitting: *Deleted

## 2012-04-01 NOTE — Telephone Encounter (Signed)
Called pt to inform of lab results, left message for pt to callback office (letter also mailed to pt). 

## 2012-04-02 NOTE — Telephone Encounter (Signed)
Left message for pt to callback office.  

## 2012-04-04 NOTE — Telephone Encounter (Signed)
You can take clomiphine with coreg, but it is viagra that is unsafe to take with coreg

## 2012-04-04 NOTE — Telephone Encounter (Signed)
Pt's spouse informed of lab results. Pt's spouse states that OV pt was told not to take Coreg with hormone medication because of interaction. They want to know if pt can take Coreg along with Clomiphene pill and if not, what other medication needs to replace Coreg.

## 2012-04-04 NOTE — Telephone Encounter (Signed)
Pt's spouse informed of MD's advisement.

## 2012-04-21 IMAGING — CR DG CHEST 1V
1 series · 1 of 1 positions shown · non-contrast
Comparison: 05/05/2011

CLINICAL DATA: Preop chest x-ray for revision of dialysis graft.
History of diabetes.

CHEST - 1 VIEW

[view not recorded]
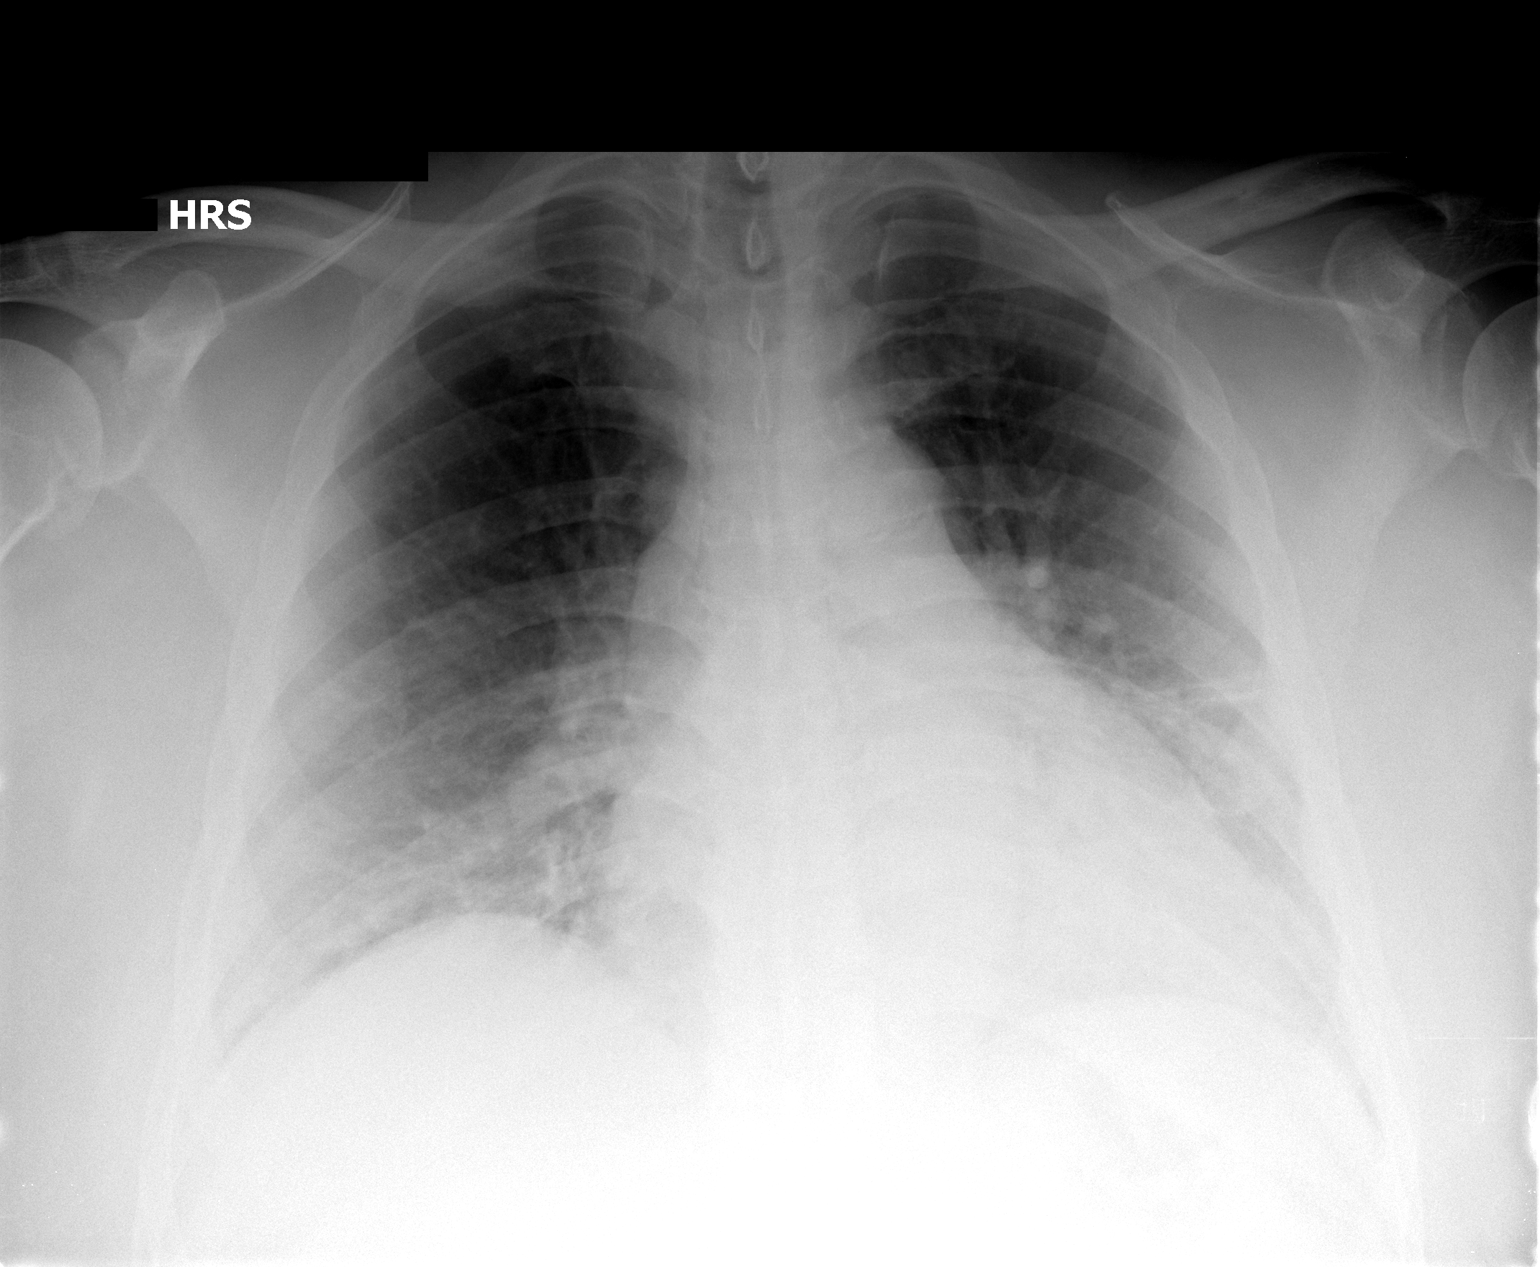

[1 of 1 positions shown; findings below may reference images not displayed]

FINDINGS: Cardiac enlargement with central pulmonary vascular
congestion similar previous study.  No developing pulmonary edema
or consolidation.  Linear fibrosis in the left mid lung is stable.
No blunting of costophrenic angles.  Calcified and tortuous aorta.
Postoperative changes in the base of the neck.
IMPRESSION: Cardiac enlargement with central pulmonary vascular congestion.  No
acute changes since the previous study.

## 2012-05-16 LAB — HM DIABETES FOOT EXAM

## 2012-06-06 ENCOUNTER — Telehealth: Payer: Self-pay | Admitting: Internal Medicine

## 2012-06-06 NOTE — Telephone Encounter (Signed)
Ov is due.  Let's address then 

## 2012-06-06 NOTE — Telephone Encounter (Signed)
Can you call pt and let him know he needs OV to F/U on testosterone? Thanks.

## 2012-06-06 NOTE — Telephone Encounter (Signed)
The pt is hoping to get his testosterone level checked.  Does he need an ov for this or just labs?  Thanks!   Callback - [phone]

## 2012-06-16 ENCOUNTER — Ambulatory Visit (HOSPITAL_COMMUNITY)
Admission: RE | Admit: 2012-06-16 | Discharge: 2012-06-16 | Disposition: A | Discharge status: Home / Self Care | Payer: Medicare HMO | Source: Ambulatory Visit | Attending: Internal Medicine | Admitting: Internal Medicine

## 2012-06-16 VITALS — BP 140/66 | HR 95 | Wt 255.5 lb

## 2012-06-16 DIAGNOSIS — I509 Heart failure, unspecified: Secondary | ICD-10-CM | POA: Insufficient documentation

## 2012-06-16 DIAGNOSIS — E785 Hyperlipidemia, unspecified: Secondary | ICD-10-CM

## 2012-06-16 DIAGNOSIS — I2510 Atherosclerotic heart disease of native coronary artery without angina pectoris: Secondary | ICD-10-CM | POA: Insufficient documentation

## 2012-06-16 NOTE — Patient Instructions (Addendum)
We will contact you in 9 months to schedule your next appointment.  

## 2012-06-16 NOTE — Assessment & Plan Note (Signed)
EF recovered on recent echo. Volume status well controlled with HD. Continue current regimen.

## 2012-06-16 NOTE — Assessment & Plan Note (Signed)
No evidence of ischemia. Continue current regimen. He will let us know when he needs a Myoview as part of his transplant w/u and we will schedule. Unable to tolerate statins.

## 2012-06-16 NOTE — Assessment & Plan Note (Signed)
Followed by Dr. Loanne Drilling. Cannot tolerate statins. Cotninue Welchol.

## 2012-06-16 NOTE — Progress Notes (Signed)
HPI:  [name] is 61 year old male with multiple medical problems including morbid obesity, diabetes, myositis with a sed rate > 100, COPD on nighttime oxygen, sleep apnea on CPAP, end-stage renal disease on HD.  We follow him for coronary artery disease, status post Taxus drug-eluting stent to the right coronary in September 2008, and a previous history of congestive heart failure secondary to ischemic cardiomyopathy with an EF of 37%. Most recent echocardiogram 12/12 EF 55% grade 2 diast dysfunction. Myoview had EF of 43% with inferior scar. no ischemia. Had [place] in 2/11 which showed low pressures (PCWP = 2) , PA 26/16 (22)  and normal cardiac output.  Here for routine f/u. Doing well with HD. Keeping fluid off. Able to go out and do the things he wants to do as long as he takes his time. Gets SOB with stairs. No CP.  No orthopnea, PND, edema. Carvedilol recently increased to 25 bid for HTN. Unable to tolerate statins so placed on Welchol.  Says about 2 weeks ago started having pain in his neck. Worse when he lays down. Sore to palpation.  Feeling better now.   Going to [facility] to be worked up for kidney transplant. Says he will need stress test.     ROS: All systems negative except as listed in HPI, PMH and Problem List.  Past Medical History  Diagnosis Date  . Hyperthyroidism   . GASTROENTERITIS, VIRAL 10/14/2009  . ONYCHOMYCOSIS, TOENAILS 12/26/2007  . GOITER, MULTINODULAR 12/26/2007  . HYPERTHYROIDISM 02/02/2010  . DIABETES MELLITUS, TYPE II 02/01/2010  . DYSLIPIDEMIA 06/18/2007  . GOUT 06/18/2007  . Hypocalcemia 06/07/2010  . DEMENTIA 09/02/2009  . DEPRESSION 10/14/2009  . PERIPHERAL NEUROPATHY 06/18/2007  . HYPERTENSION 06/18/2007  . CONGESTIVE HEART FAILURE 06/18/2007  . DIASTOLIC HEART FAILURE, CHRONIC 02/06/2009  . Unspecified hypotension 01/30/2010  . PULMONARY NODULE, RIGHT LOWER LOBE 06/08/2009  . GERD 06/18/2007  . ESRD 08/04/2010  . BENIGN PROSTATIC HYPERTROPHY 10/14/2009  . GYNECOMASTIA  07/17/2010  . NECK PAIN 07/31/2010  . FOOT PAIN 08/12/2008  . DIZZINESS 07/17/2010  . Other malaise and fatigue 11/24/2009  . GAIT DISTURBANCE 03/03/2010  . DYSPNEA 10/29/2008  . CHEST PAIN 03/29/2010  . TRANSAMINASES, SERUM, ELEVATED 02/01/2010  . COLONIC POLYPS, HX OF 10/14/2009  . Anemia 06/16/2011  . Ischemic cardiomyopathy 06/16/2011  . Hyperparathyroidism, secondary 06/16/2011  . OSA on CPAP 10/16/2011  . Hyperlipidemia 10/16/2011  . CAD, NATIVE VESSEL 02/06/2009    saw Dr. Missy Sabins last jan 2013  . Sleep apnea     cpap machine and o2    Current Outpatient Prescriptions  Medication Sig Dispense Refill  . allopurinol (ZYLOPRIM) 100 MG tablet Take 100 mg by mouth daily.        [name] amitriptyline (ELAVIL) 50 MG tablet Take 50 mg by mouth daily as needed. As needed when feeling depressed.      [name] aspirin 81 MG EC tablet Take 81 mg by mouth daily.        . calcium-vitamin D (OSCAL WITH D) 500-200 MG-UNIT per tablet Take 3 tablets by mouth 3 (three) times daily.       . carvedilol (COREG) 12.5 MG tablet Take 12.5 mg by mouth 2 (two) times daily with a meal.      . cinacalcet (SENSIPAR) 60 MG tablet Take 60 mg by mouth 2 (two) times daily.        . clomiPHENE (CLOMID) 50 MG tablet 1/4 tab daily  10 tablet  5  . Colesevelam HCl (  WELCHOL) 3.75 G PACK Take 3.75 g by mouth daily.      [name] gabapentin (NEURONTIN) 300 MG capsule Take 300 mg by mouth at bedtime as needed.        . multivitamin (RENA-VIT) TABS tablet Take 1 tablet by mouth daily.        . Omega-3 Fatty Acids (FISH OIL) 1000 MG CAPS Take by mouth 3 (three) times daily.        [name] omeprazole (PRILOSEC) 20 MG capsule Take 1 capsule (20 mg total) by mouth daily.  90 capsule  3  . ranitidine (ZANTAC) 300 MG capsule Take 300 mg by mouth daily.           PHYSICAL EXAM: Filed Vitals:   06/16/12 1110  BP: 140/66  Pulse: 95   General:  Well appearing. No resp difficulty HEENT: normal Neck: supple. JVP flat. Carotids 2+ bilaterally; no bruits.  No lymphadenopathy or thryomegaly appreciated. Cor: PMI normal. Regular rate & rhythm. No rubs, gallops 2/6 SEM LUSB Lungs: clear Abdomen: obese  soft, nontender, nondistended. No hepatosplenomegaly. No bruits or masses. Good bowel sounds. Extremities: no cyanosis, clubbing, rash, edema. + grafts in both UEs  Neuro: alert & orientedx3, cranial nerves grossly intact. Moves all 4 extremities w/o difficulty. Affect pleasant.      ASSESSMENT & PLAN:

## 2012-06-18 ENCOUNTER — Telehealth: Payer: Self-pay | Admitting: Endocrinology

## 2012-06-18 NOTE — Telephone Encounter (Signed)
His wife is aware and will call back after checking her calendar.

## 2012-06-18 NOTE — Telephone Encounter (Signed)
Ov is due 

## 2012-06-18 NOTE — Telephone Encounter (Signed)
Wife called to see when [name] is to come for an appt and when he is to repeat labs.

## 2012-07-04 ENCOUNTER — Ambulatory Visit (INDEPENDENT_AMBULATORY_CARE_PROVIDER_SITE_OTHER): Payer: Medicare HMO | Admitting: Endocrinology

## 2012-07-04 ENCOUNTER — Encounter: Payer: Self-pay | Admitting: Endocrinology

## 2012-07-04 VITALS — BP 134/76 | HR 92 | Temp 97.6°F | Wt 250.0 lb

## 2012-07-04 DIAGNOSIS — E0590 Thyrotoxicosis, unspecified without thyrotoxic crisis or storm: Secondary | ICD-10-CM

## 2012-07-04 DIAGNOSIS — E291 Testicular hypofunction: Secondary | ICD-10-CM

## 2012-07-04 DIAGNOSIS — E119 Type 2 diabetes mellitus without complications: Secondary | ICD-10-CM

## 2012-07-04 MED ORDER — CLOMIPHENE CITRATE 50 MG PO TABS: ORAL_TABLET | ORAL | Status: DC

## 2012-07-04 NOTE — Progress Notes (Signed)
Subjective:    Patient ID: [name], male    DOB: [birth year], 61 y.o.   MRN: [id number]  HPI The state of at least three ongoing medical problems is addressed today: Pt returns for f/u of type 2 DM (dx'ed 2003; he has not required any medication; complicated by peripheral sensory neuropathy and CAD).  He denies weight loss.  no cbg record, but states cbg's are well-controlled Hyperthyroidism: he has not recently required tapazole.  He has fatigue. Hypogonadism was noted at dialysis.  He has generalized muscle weakness.  He says ED sxs persist, but he ran out of clomid 1 month ago. Past Medical History  Diagnosis Date  . Hyperthyroidism   . GASTROENTERITIS, VIRAL 10/14/2009  . ONYCHOMYCOSIS, TOENAILS 12/26/2007  . GOITER, MULTINODULAR 12/26/2007  . HYPERTHYROIDISM 02/02/2010  . DIABETES MELLITUS, TYPE II 02/01/2010  . DYSLIPIDEMIA 06/18/2007  . GOUT 06/18/2007  . Hypocalcemia 06/07/2010  . DEMENTIA 09/02/2009  . DEPRESSION 10/14/2009  . PERIPHERAL NEUROPATHY 06/18/2007  . HYPERTENSION 06/18/2007  . CONGESTIVE HEART FAILURE 06/18/2007  . DIASTOLIC HEART FAILURE, CHRONIC 02/06/2009  . Unspecified hypotension 01/30/2010  . PULMONARY NODULE, RIGHT LOWER LOBE 06/08/2009  . GERD 06/18/2007  . ESRD 08/04/2010  . BENIGN PROSTATIC HYPERTROPHY 10/14/2009  . GYNECOMASTIA 07/17/2010  . NECK PAIN 07/31/2010  . FOOT PAIN 08/12/2008  . DIZZINESS 07/17/2010  . Other malaise and fatigue 11/24/2009  . GAIT DISTURBANCE 03/03/2010  . DYSPNEA 10/29/2008  . CHEST PAIN 03/29/2010  . TRANSAMINASES, SERUM, ELEVATED 02/01/2010  . COLONIC POLYPS, HX OF 10/14/2009  . Anemia 06/16/2011  . Ischemic cardiomyopathy 06/16/2011  . Hyperparathyroidism, secondary 06/16/2011  . OSA on CPAP 10/16/2011  . Hyperlipidemia 10/16/2011  . CAD, NATIVE VESSEL 02/06/2009    saw Dr. Missy Sabins last jan 2013  . Sleep apnea     cpap machine and o2    Past Surgical History  Procedure Date  . Stent 06/11/08   . Neck sugury 2/09  . Back surgury 1998    . Givens capsule study 09/29/2011    Procedure: GIVENS CAPSULE STUDY;  Surgeon: Missy Sabins, MD;  Location: [place];  Service: Endoscopy;  Laterality: Left;  . Esophagogastroduodenoscopy 09/28/2011    Procedure: ESOPHAGOGASTRODUODENOSCOPY (EGD);  Surgeon: Missy Sabins, MD;  Location: [facility] ENDOSCOPY;  Service: Endoscopy;  Laterality: N/A;  . Givens capsule study 09/30/2011    Procedure: GIVENS CAPSULE STUDY;  Surgeon: Jeryl Columbia, MD;  Location: [facility] ENDOSCOPY;  Service: Endoscopy;  Laterality: N/A;  . Cholecystectomy   . Cardiac catheterization   . Tonsillectomy   . Av fistula placement 11/07/2011    Procedure: INSERTION OF ARTERIOVENOUS (AV) GORE-TEX GRAFT ARM;  Surgeon: Tinnie Gens, MD;  Location: [place];  Service: Vascular;  Laterality: Left;    History   Social History  . Marital Status: Married    Spouse Name: N/A    Number of Children: N/A  . Years of Education: N/A   Occupational History  . disabled   . formerly Lawyer for [employer].    Social History Main Topics  . Smoking status: Former Smoker -- 1.0 packs/day for 25 years    Types: Cigarettes  . Smokeless tobacco: Never Used   Comment: Quit smoking 2007 Smoked x 25 years 1/2 ppd.  . Alcohol Use: Yes     occassional   . Drug Use: No  . Sexually Active: Not on file   Other Topics Concern  . Not on file   Social History Narrative  .  No narrative on file    Current Outpatient Prescriptions on File Prior to Visit  Medication Sig Dispense Refill  . allopurinol (ZYLOPRIM) 100 MG tablet Take 100 mg by mouth daily.        [name] amitriptyline (ELAVIL) 50 MG tablet Take 50 mg by mouth daily as needed. As needed when feeling depressed.      [name] aspirin 81 MG EC tablet Take 81 mg by mouth daily.        . calcium-vitamin D (OSCAL WITH D) 500-200 MG-UNIT per tablet Take 3 tablets by mouth 3 (three) times daily.       . carvedilol (COREG) 25 MG tablet Take 25 mg by mouth 2 (two) times daily with a meal.      .  cinacalcet (SENSIPAR) 60 MG tablet Take 60 mg by mouth 2 (two) times daily.        . Colesevelam HCl (WELCHOL) 3.75 G PACK Take 3.75 g by mouth daily.      [name] gabapentin (NEURONTIN) 300 MG capsule Take 300 mg by mouth at bedtime as needed.        . multivitamin (RENA-VIT) TABS tablet Take 1 tablet by mouth daily.        . Omega-3 Fatty Acids (FISH OIL) 1000 MG CAPS Take by mouth 3 (three) times daily.        [name] omeprazole (PRILOSEC) 20 MG capsule Take 1 capsule (20 mg total) by mouth daily.  90 capsule  3    Allergies  Allergen Reactions  . Cephalexin Other (See Comments)    Tongue swelling  . Statins     REACTION: perceived myalgias    Family History  Problem Relation Age of Onset  . Heart disease Father   . Heart disease Sister     BP 134/76  Pulse 92  Temp 97.6 F (36.4 C) (Oral)  Wt 250 lb (113.399 kg)     Review of Systems Denies fever and tremor.  Denies decreased urinary stream.      Objective:   Physical Exam VITAL SIGNS:  See vs page GENERAL: no distress Pulses: dorsalis pedis intact bilat.   Feet: no deformity.  no ulcer on the feet.  feet are of normal color and temp.  no edema Neuro: sensation is intact to touch on the feet.     Assessment & Plan:  Hypogonadism.  He wants to resume rx DM, apparently well-controlled Hyperthyroidism, clinically well-controlled

## 2012-07-04 NOTE — Patient Instructions (Addendum)
i have sent a prescription to your pharmacy, to refill the "clomiphine" (to raise the testosterone level).   Please redo the blood tests in 1 month.  You will be contacted with results. Please come back for a follow-up appointment in 6 months. if ever you have fever while taking methimazole, stop it and call us, because of the risk of a rare side-effect.

## 2012-07-07 ENCOUNTER — Other Ambulatory Visit (INDEPENDENT_AMBULATORY_CARE_PROVIDER_SITE_OTHER): Payer: Medicare HMO

## 2012-07-07 DIAGNOSIS — E0590 Thyrotoxicosis, unspecified without thyrotoxic crisis or storm: Secondary | ICD-10-CM

## 2012-07-07 DIAGNOSIS — E291 Testicular hypofunction: Secondary | ICD-10-CM

## 2012-07-07 DIAGNOSIS — E119 Type 2 diabetes mellitus without complications: Secondary | ICD-10-CM

## 2012-07-07 LAB — TSH: TSH: 0.91 u[IU]/mL (ref 0.35–5.50)

## 2012-07-09 ENCOUNTER — Ambulatory Visit
Admission: RE | Admit: 2012-07-09 | Discharge: 2012-07-09 | Disposition: A | Discharge status: Home / Self Care | Payer: Medicare HMO | Source: Ambulatory Visit | Attending: Nephrology | Admitting: Nephrology

## 2012-07-09 ENCOUNTER — Other Ambulatory Visit: Payer: Self-pay | Admitting: Nephrology

## 2012-07-09 DIAGNOSIS — R0781 Pleurodynia: Secondary | ICD-10-CM

## 2012-07-17 IMAGING — XA IR US GUIDE VASC ACCESS RIGHT
1 series · 1 of 1 positions shown · non-contrast
Comparison: None

CLINICAL DATA: Failed right shunt angioplasty.  Needs access for
hemodialysis.

TUNNELED HEMODIALYSIS CATHETER PLACEMENT WITH ULTRASOUND AND
FLUOROSCOPIC GUIDANCE:
TECHNIQUE: The procedure, risks, benefits, and alternatives were
explained to the patient and spouse.  Questions regarding the
procedure were encouraged and answered.  The patient understands
and consents to the procedure.

[Series 1: run · 1 of 1 slices shown]
[im 1/1]
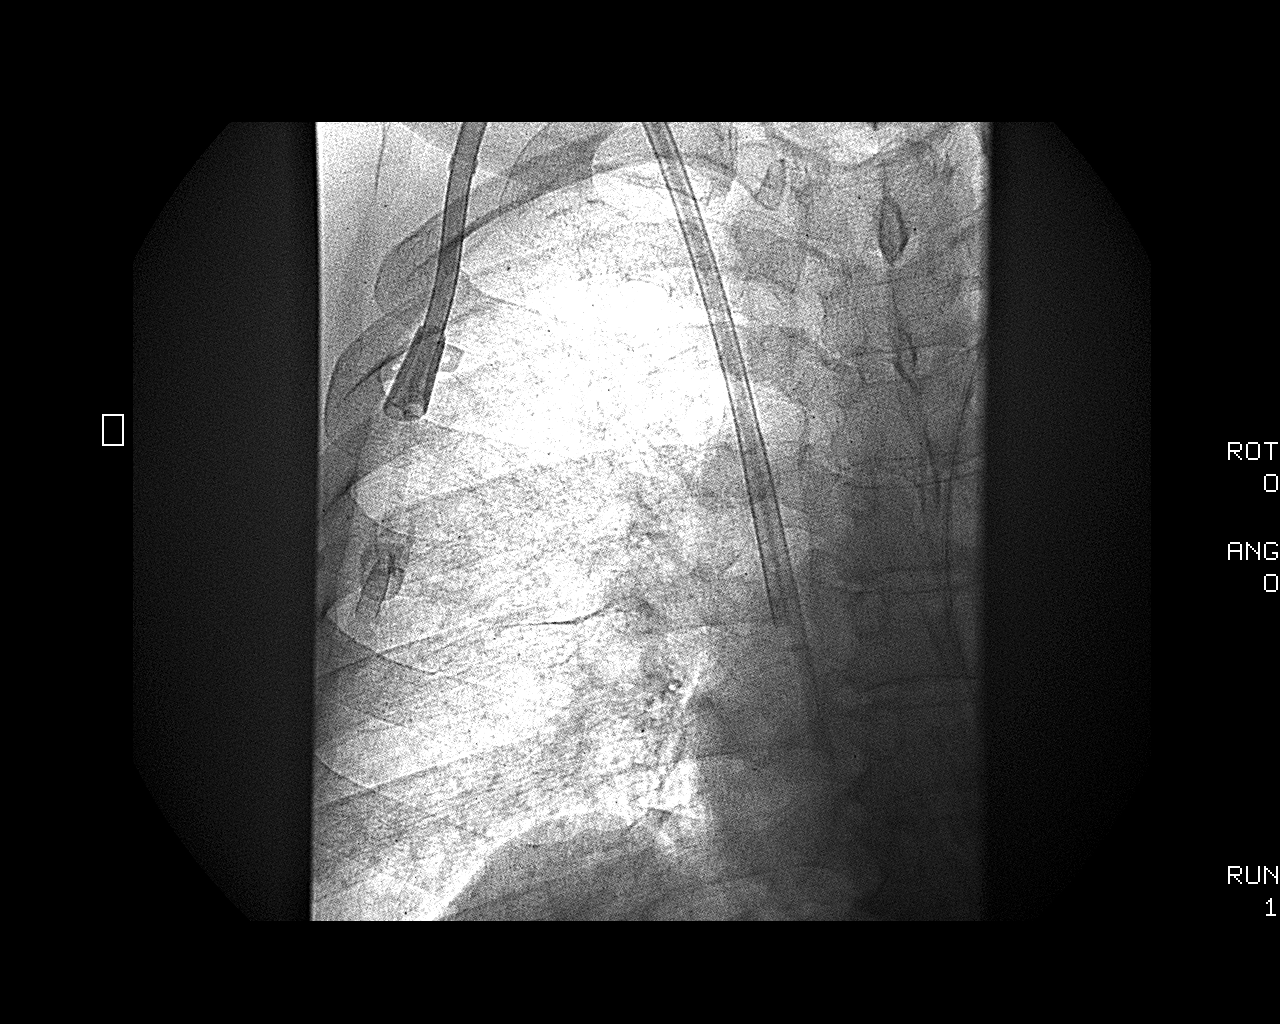

[1 of 1 positions shown; findings below may reference images not displayed]

As antibiotic prophylaxis, vancomycin 1 gram IV was ordered pre-
procedure and administered intravenously within one hour of
incision.

Patency of the right IJ vein was confirmed with ultrasound with
image documentation. An appropriate skin site was determined.
Region was prepped using maximum barrier technique including cap
and mask, sterile gown, sterile gloves, large sterile sheet, and
Chlorhexidine   as cutaneous antisepsis. The region was infiltrated
locally with 1% lidocaine.

Intravenous Fentanyl and Versed were administered as conscious
sedation during continuous cardiorespiratory monitoring by the
radiology RN, with a total moderate sedation time of 14 minutes.

Under real-time ultrasound guidance, the right IJ vein was accessed
with a 21 gauge micropuncture needle; the needle tip within the
vein was confirmed with ultrasound image documentation.   Needle
exchanged over the 018 guidewire for transitional dilator, which
allowed advancement of a Benson wire into the IVC. Over this, an
MPA catheter was advanced. A Hemosplit 23 hemodialysis catheter was
tunneled from the right anterior chest wall approach to the right
IJ dermatotomy site. The MPA catheter was exchanged over an Amplatz
wire for serial vascular dilators which allow placement of a peel-
away sheath, through which the catheter was advanced under
intermittent fluoroscopy, positioned with its tips in the proximal
and midright atrium. Spot chest radiograph confirms good catheter
position. No pneumothorax. Catheter was flushed and primed per
protocol. Catheter secured externally with O Prolene sutures. The
right IJ   dermatotomy site was closed   Dermabond. No immediate
complication.
IMPRESSION: 1. Technically successful placement of tunneled right IJ
hemodialysis catheter with ultrasound and fluoroscopic guidance.
Ready for routine use.

## 2012-07-18 IMAGING — CT CT ABD-PELV W/O CM
2 of 4 series · 16 of 46 positions shown, 18 images · non-contrast
Comparison: MRCP performed 12/30/2009, and abdominal ultrasound
performed 12/28/2009

CLINICAL DATA: Decreased hemoglobin; assess for retroperitoneal
hematoma.

CT ABDOMEN AND PELVIS WITHOUT CONTRAST
TECHNIQUE: Multidetector CT imaging of the abdomen and pelvis was
performed following the standard protocol without intravenous
contrast.

[Series 2: abd/pelv w/o 5.0 b31f st · axial · non-contrast · 0.92mm/px · z∈[+796,+1241]mm · 13 of 99 slices shown, 15 images]
[im 5/99  soft-tissue]
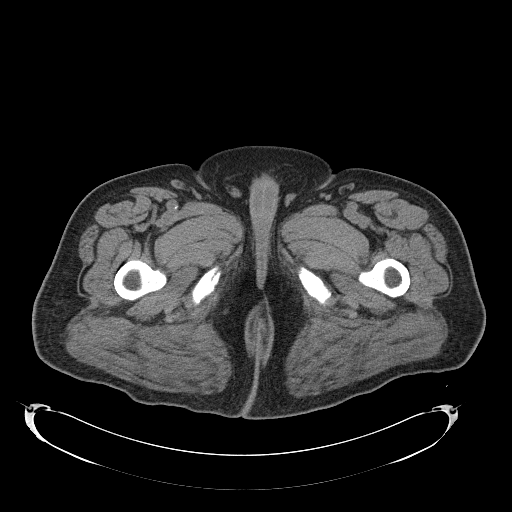
[im 5/99  bone]
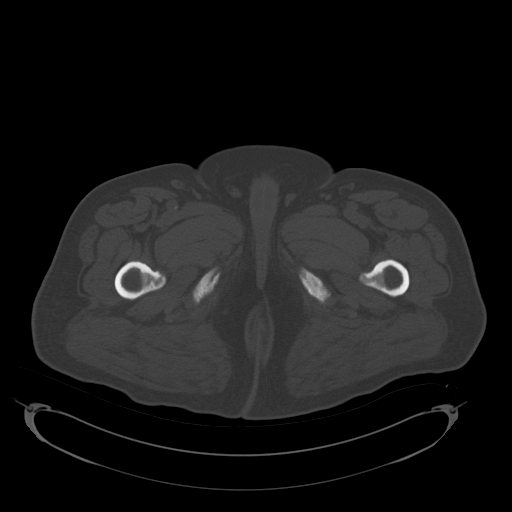
[im 13/99  soft-tissue]
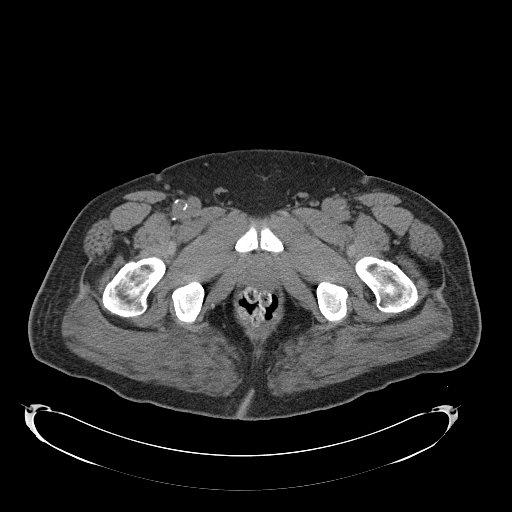
[im 22/99  soft-tissue]
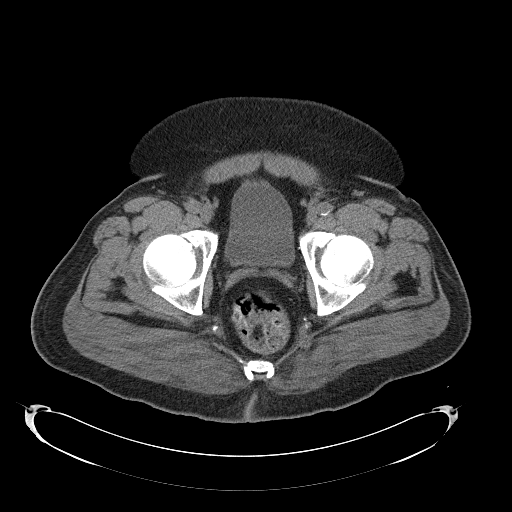
[im 26/99  soft-tissue]
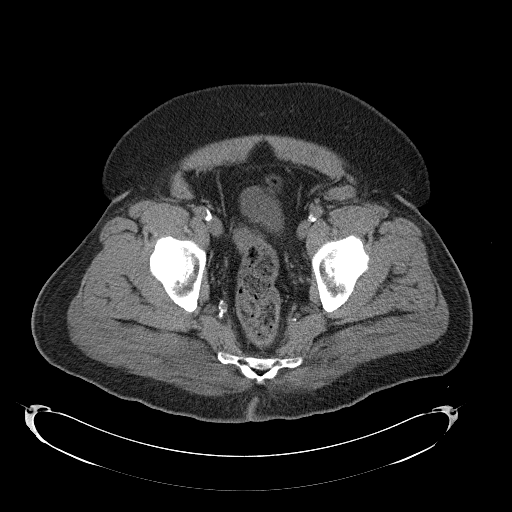
[im 35/99  soft-tissue]
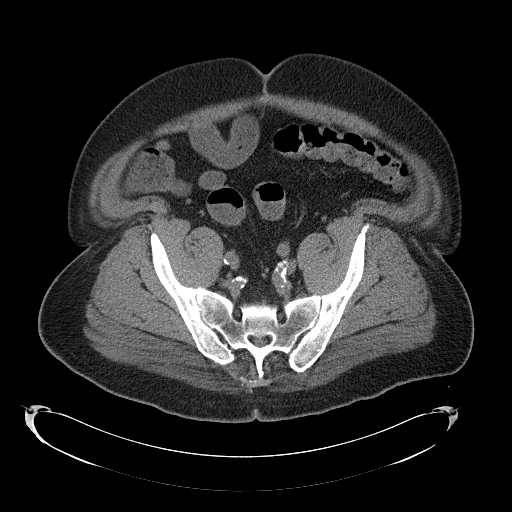
[im 43/99  soft-tissue]
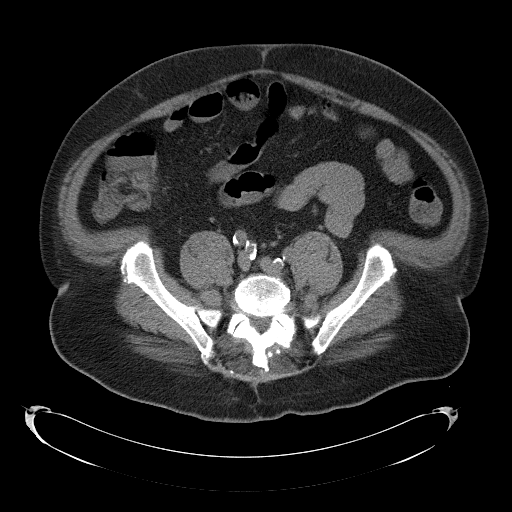
[im 52/99  soft-tissue]
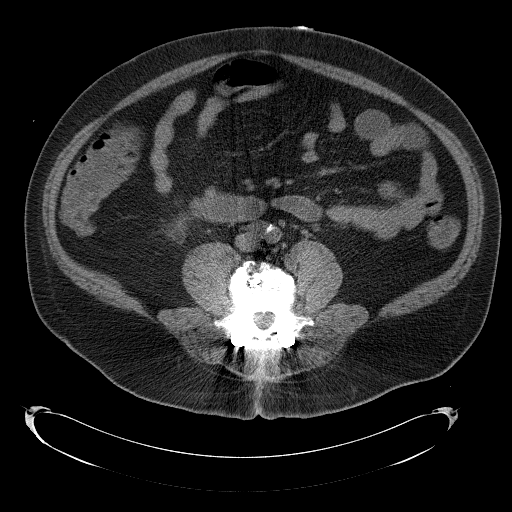
[im 56/99  soft-tissue]
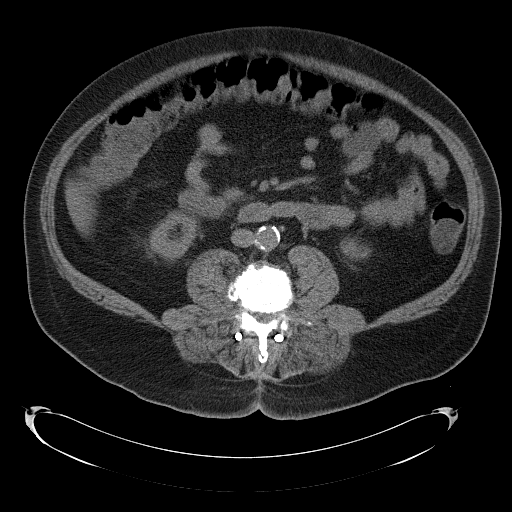
[im 64/99  soft-tissue]
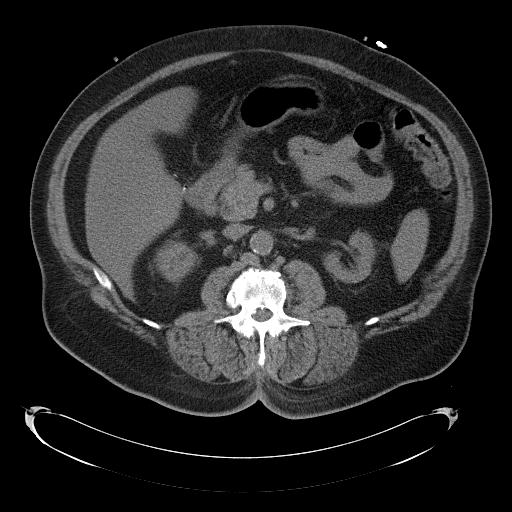
[im 64/99  bone]
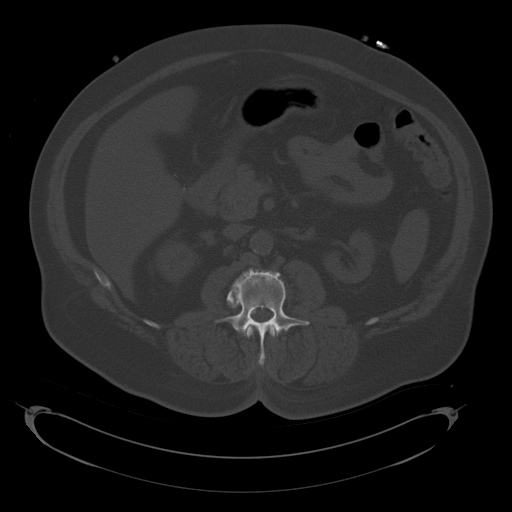
[im 73/99  soft-tissue]
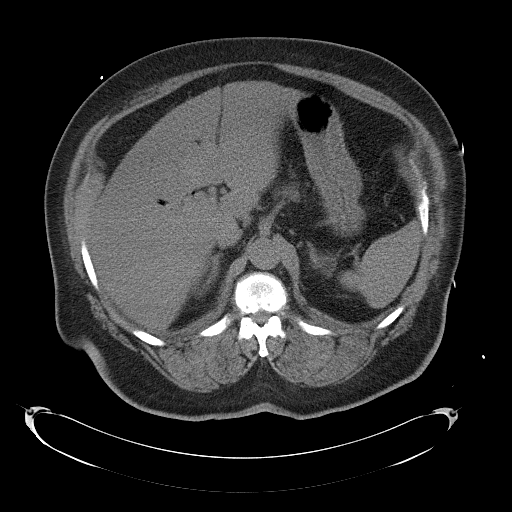
[im 77/99  soft-tissue]
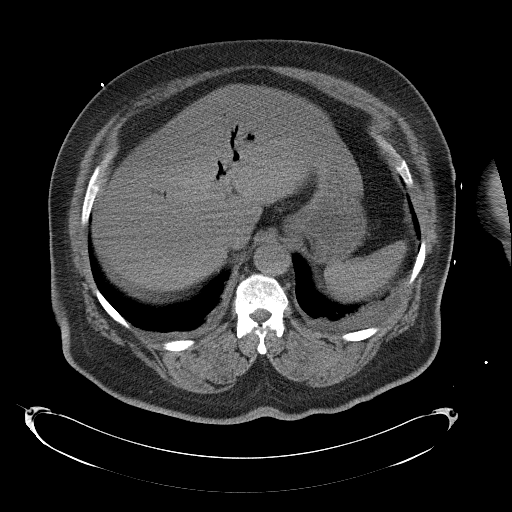
[im 86/99  soft-tissue]
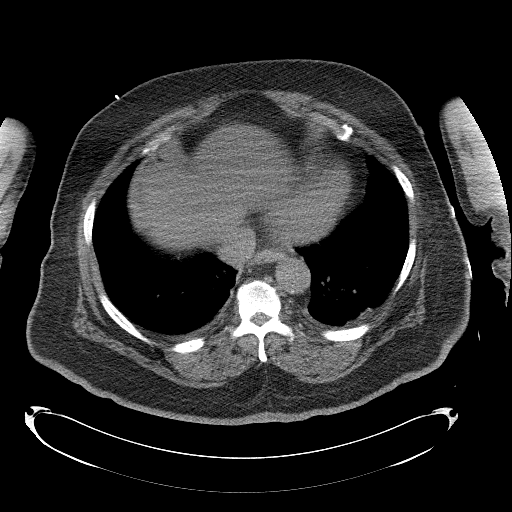
[im 94/99  soft-tissue]
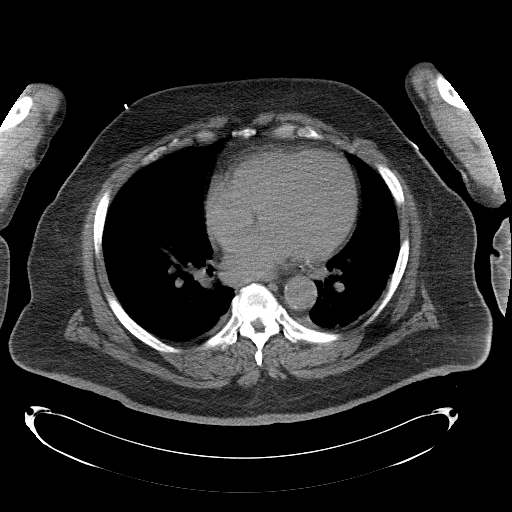

[Series 5: coronals · coronal · 1.04mm/px · 3 of 174 slices shown]
[im 58/174  soft-tissue]
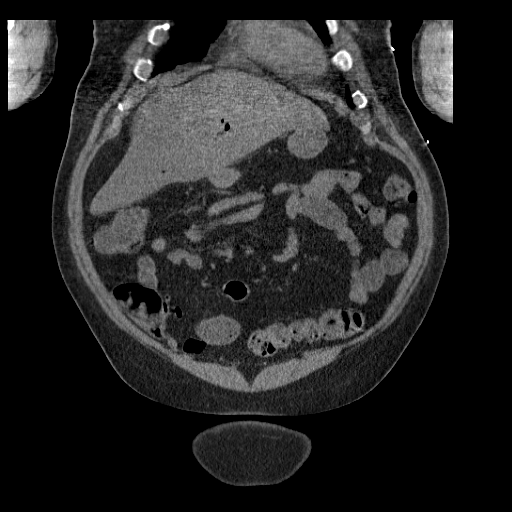
[im 77/174  soft-tissue]
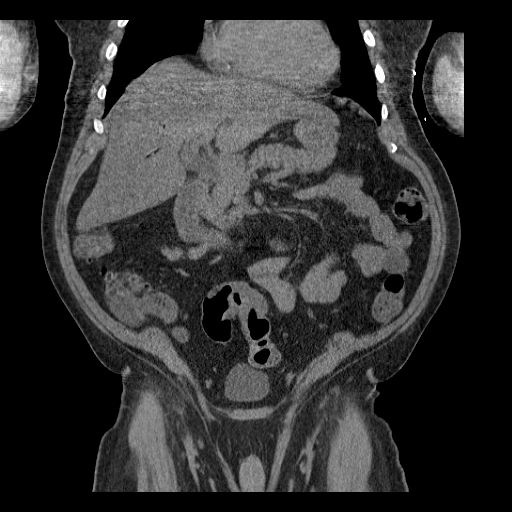
[im 97/174  soft-tissue]
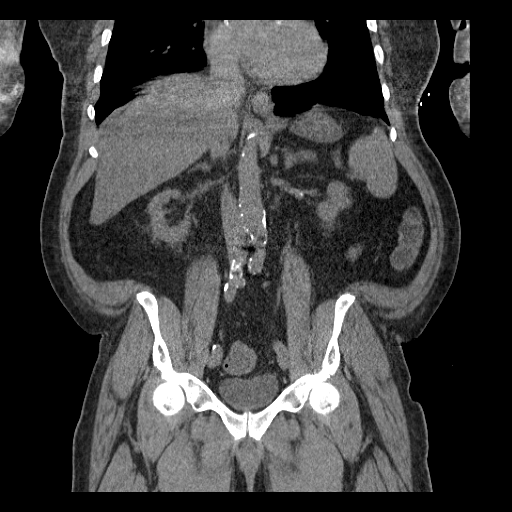

[16 of 46 positions shown; findings below may reference images not displayed]

FINDINGS: Relatively diffuse patchy airspace opacification is noted
within the right lung, concerning for right-sided pneumonia.  Trace
bilateral pleural effusions noted, with associated atelectasis.
Diffuse coronary artery calcifications are seen.

Air is noted filling the intrahepatic biliary ducts; this reflects
prior sphincterotomy.  The liver is otherwise unremarkable in
appearance.  The spleen is within normal limits.  The patient is
status post cholecystectomy, with clips noted at the gallbladder
fossa.  The pancreas and adrenal glands are grossly unremarkable.

Bilateral renal atrophy is noted, with diffuse nonspecific
bilateral perinephric stranding, and associated vascular
calcifications at the renal hila.  There is no evidence of
hydronephrosis.  No renal or ureteral stones are identified.

No free fluid is identified.  The small bowel is unremarkable in
appearance.  The stomach is within normal limits.  No acute
vascular abnormalities are seen.  Scattered calcification is noted
along the abdominal aorta and its branches.

The appendix is normal in caliber and contains air, without
evidence for appendicitis.  The colon is partially filled with
stool and is unremarkable in appearance.

There is no evidence for retroperitoneal hematoma.  No focal fluid
collections are identified.

The bladder is mildly distended and grossly unremarkable in
appearance.  The prostate remains normal in size.  No inguinal
lymphadenopathy is seen.

No acute osseous abnormalities are identified.  The patient is
status post lumbar spinal fusion and decompression at L3-L5, with
associated vacuum phenomenon and disc space narrowing noted along
the upper lumbar spine.
IMPRESSION: 1.  No evidence of retroperitoneal hematoma.
2.  Patchy diffuse right-sided pneumonia noted.

3.  Trace bilateral pleural effusions, with associated atelectasis.
4.  Diffuse coronary artery calcifications seen.
5.  Air noted filling the intrahepatic biliary ducts, reflecting
prior sphincterotomy.
6.  Bilateral renal atrophy.
7.  Scattered calcification along the abdominal aorta and its
branches.
8.  Postoperative and degenerative changes noted along the lumbar
spine.

Findings were discussed with [REDACTED] on [id number] at [DATE] a.m. on
09/27/2011.

## 2012-08-28 IMAGING — CR DG CHEST 2V
2 series · 2 of 2 positions shown · non-contrast
Comparison: Chest x-ray dated 07/01/2011 and 03/29/2010

CLINICAL DATA: Preoperative respiratory exam.  Renal failure. New
dialysis fistula creation.

CHEST - 2 VIEW

[view not recorded (1 of 2)]
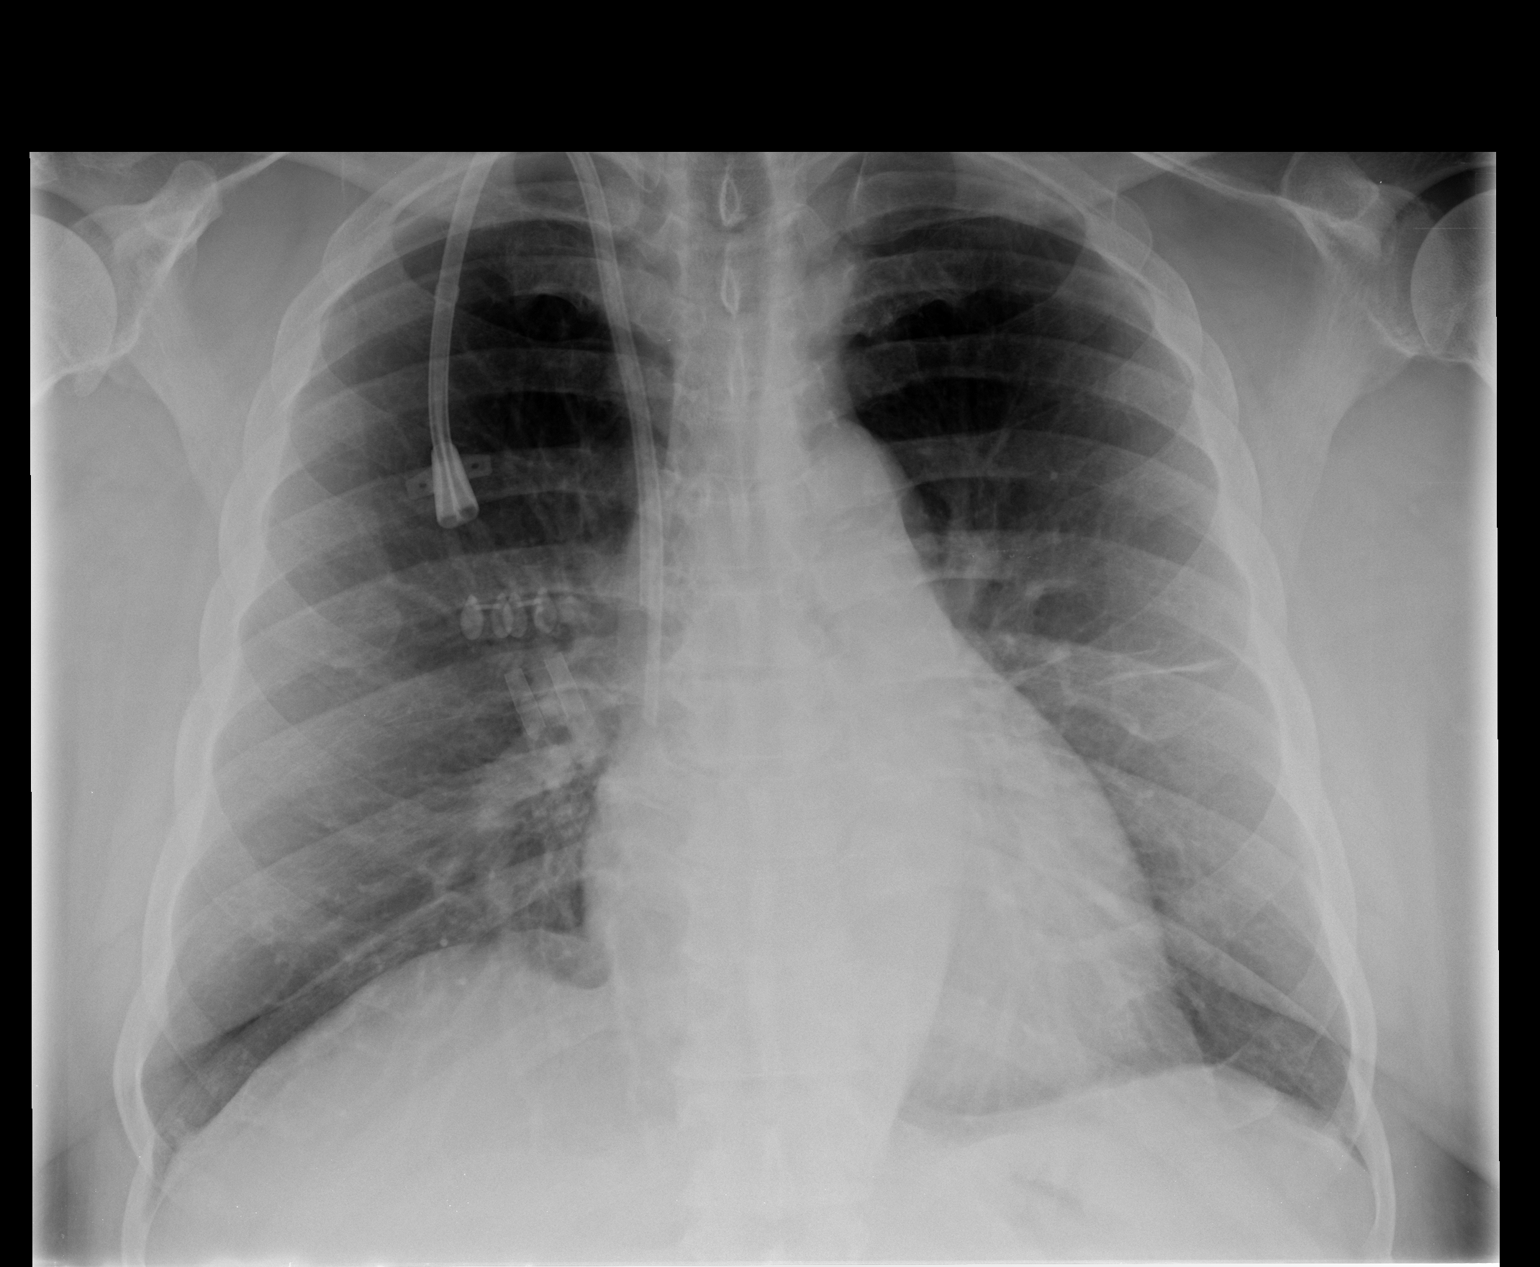

[view not recorded (2 of 2)]
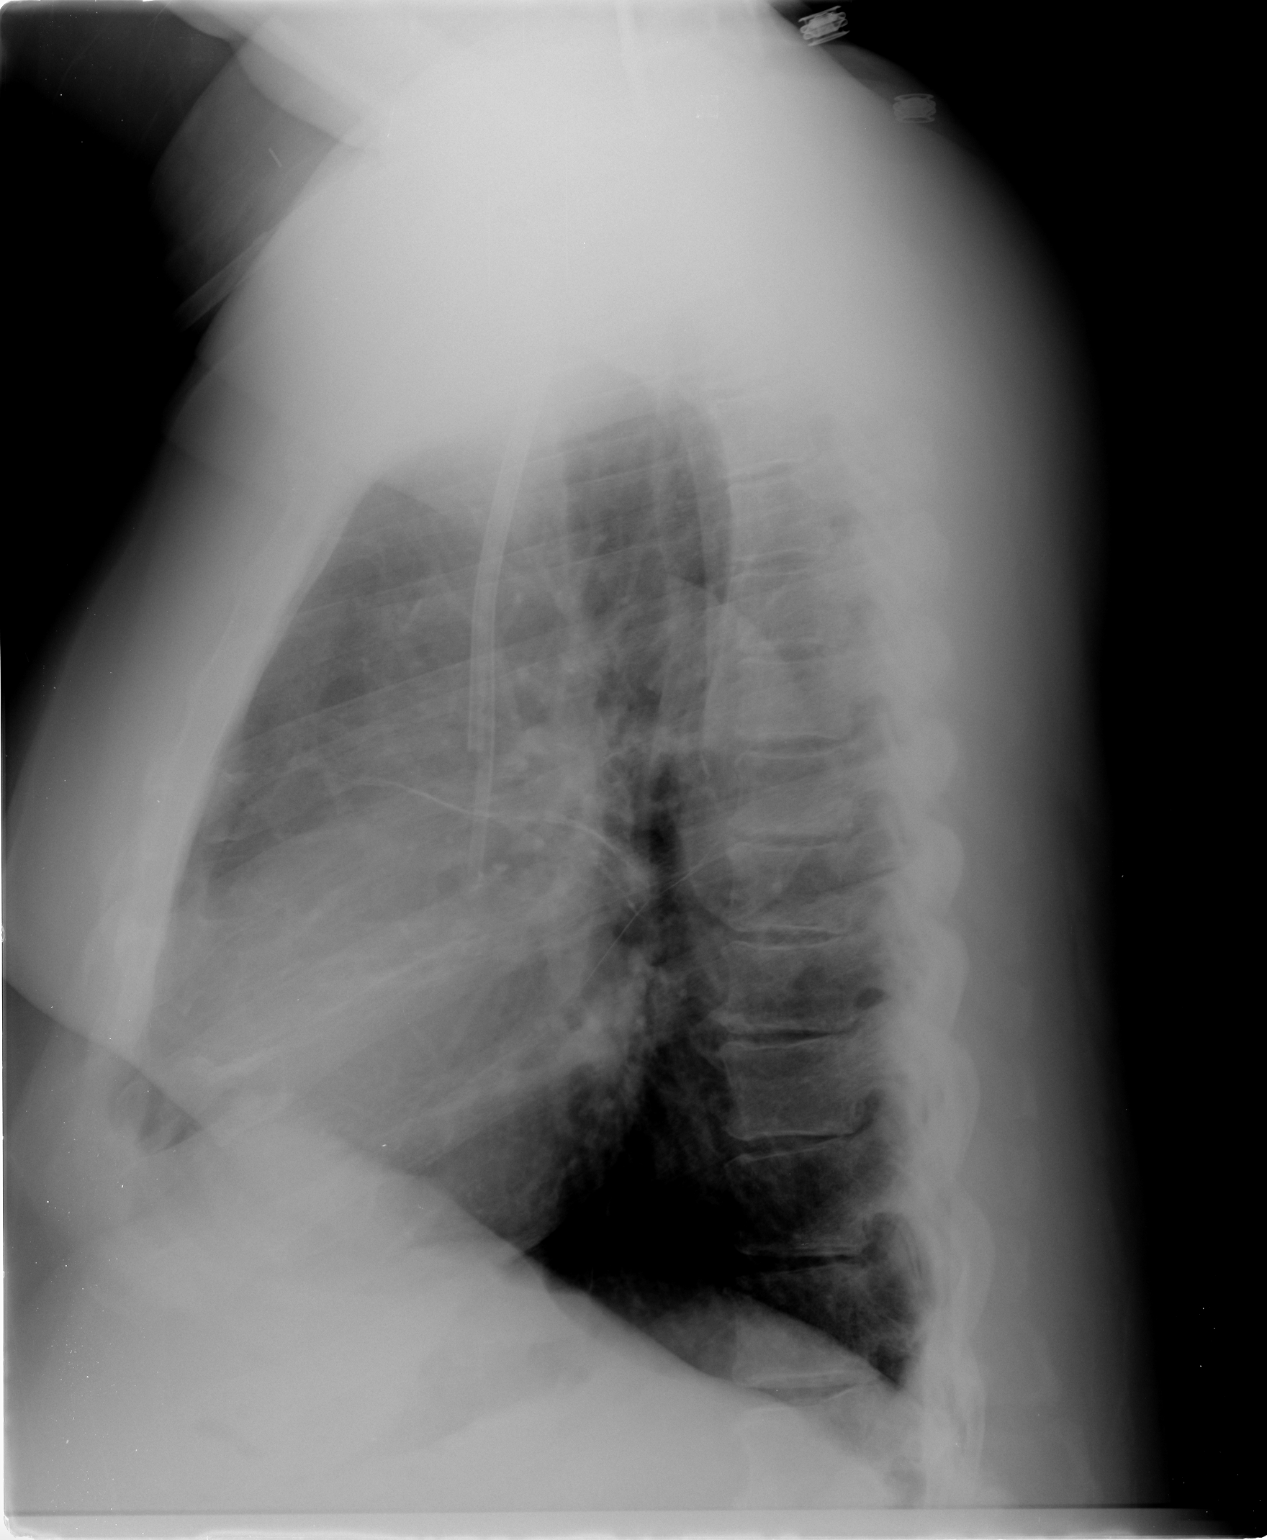

[2 of 2 positions shown; findings below may reference images not displayed]

FINDINGS: Double-lumen central venous catheter in place with the
tips in the superior vena cava.  Heart size and vascularity are
normal.  Slight scarring in the left midzone, stable.  No
effusions.  No acute osseous abnormality.
IMPRESSION: No acute abnormalities.

## 2012-09-10 ENCOUNTER — Ambulatory Visit: Payer: Medicare HMO | Admitting: Internal Medicine

## 2012-09-15 ENCOUNTER — Other Ambulatory Visit: Payer: Self-pay | Admitting: Gastroenterology

## 2012-09-15 DIAGNOSIS — R1310 Dysphagia, unspecified: Secondary | ICD-10-CM

## 2012-09-15 DIAGNOSIS — R112 Nausea with vomiting, unspecified: Secondary | ICD-10-CM

## 2012-09-26 ENCOUNTER — Ambulatory Visit
Admission: RE | Admit: 2012-09-26 | Discharge: 2012-09-26 | Disposition: A | Discharge status: Home / Self Care | Payer: Medicare HMO | Source: Ambulatory Visit | Attending: Gastroenterology | Admitting: Gastroenterology

## 2012-09-26 DIAGNOSIS — R112 Nausea with vomiting, unspecified: Secondary | ICD-10-CM

## 2012-09-26 DIAGNOSIS — R1310 Dysphagia, unspecified: Secondary | ICD-10-CM

## 2012-09-29 ENCOUNTER — Telehealth (HOSPITAL_COMMUNITY): Payer: Self-pay | Admitting: Cardiology

## 2012-09-29 DIAGNOSIS — I509 Heart failure, unspecified: Secondary | ICD-10-CM

## 2012-09-29 NOTE — Telephone Encounter (Signed)
pts wife is calling to request a stress test per [facility]. Request to have this done prior to his return appointment with [facility] in Feb. Wife is unsure if this should be exercise or nuclear.   Please advise

## 2012-09-29 NOTE — Telephone Encounter (Signed)
He cant walk far. lexiscan myoview. Thanks.

## 2012-10-01 HISTORY — PX: INSERTION OF DIALYSIS CATHETER: SHX1324

## 2012-10-08 ENCOUNTER — Encounter: Payer: Self-pay | Admitting: Internal Medicine

## 2012-10-14 NOTE — Telephone Encounter (Signed)
Done. appt 10/15/12 pts wife aware

## 2012-10-15 ENCOUNTER — Encounter (HOSPITAL_COMMUNITY): Payer: Medicare HMO

## 2012-10-15 ENCOUNTER — Ambulatory Visit (HOSPITAL_COMMUNITY): Payer: Medicare HMO | Attending: Internal Medicine | Admitting: Radiology

## 2012-10-15 VITALS — BP 167/86 | Ht 73.0 in | Wt 251.0 lb

## 2012-10-15 DIAGNOSIS — R079 Chest pain, unspecified: Secondary | ICD-10-CM

## 2012-10-15 DIAGNOSIS — I10 Essential (primary) hypertension: Secondary | ICD-10-CM | POA: Insufficient documentation

## 2012-10-15 DIAGNOSIS — R0609 Other forms of dyspnea: Secondary | ICD-10-CM | POA: Insufficient documentation

## 2012-10-15 DIAGNOSIS — R0989 Other specified symptoms and signs involving the circulatory and respiratory systems: Secondary | ICD-10-CM | POA: Insufficient documentation

## 2012-10-15 DIAGNOSIS — E119 Type 2 diabetes mellitus without complications: Secondary | ICD-10-CM | POA: Insufficient documentation

## 2012-10-15 DIAGNOSIS — R0789 Other chest pain: Secondary | ICD-10-CM | POA: Insufficient documentation

## 2012-10-15 DIAGNOSIS — I509 Heart failure, unspecified: Secondary | ICD-10-CM

## 2012-10-15 DIAGNOSIS — I2510 Atherosclerotic heart disease of native coronary artery without angina pectoris: Secondary | ICD-10-CM

## 2012-10-15 MED ORDER — TECHNETIUM TC 99M SESTAMIBI GENERIC - CARDIOLITE
33.0000 | Freq: Once | INTRAVENOUS | Status: AC | PRN
  Administered 2012-10-15: 33 via INTRAVENOUS

## 2012-10-15 MED ORDER — TECHNETIUM TC 99M SESTAMIBI GENERIC - CARDIOLITE
11.0000 | Freq: Once | INTRAVENOUS | Status: AC | PRN
  Administered 2012-10-15: 11 via INTRAVENOUS

## 2012-10-15 MED ORDER — REGADENOSON 0.4 MG/5ML IV SOLN
0.4000 mg | Freq: Once | INTRAVENOUS | Status: AC
  Administered 2012-10-15: 0.4 mg via INTRAVENOUS

## 2012-10-15 NOTE — Progress Notes (Signed)
[facility] [address] [place], [place] [address] [phone]    Cardiology Nuclear Med Study  [name] is a 62 y.o. male     MRN : [id number]     DOB: 1951-01-30  Procedure Date: 10/15/2012  Nuclear Med Background Indication for Stress Test:  Evaluation for Ischemia, Surgical Clearance-Pending Kidney Transplant @[facility] , Stent Patency and PTCA Patency History:  2012 EF 55%, 9/10 MPS EF 43% no ischemia, 9/08 Stent-RCA, hx MI Cardiac Risk Factors: Family History - CAD, History of Smoking, Hypertension, Lipids, NIDDM and Obesity  Symptoms:  Chest Tightness (last date of chest discomfort 3 months ago), DOE and Fatigue with Exertion   Nuclear Pre-Procedure Caffeine/Decaff Intake:  None NPO After: 11:00pm   Lungs:  clear O2 Sat: 94% on room air. IV 0.9% NS with Angio Cath:  22g  IV Site: R Antecubital  IV Started by:  Crissie Figures, RN  Chest Size (in):  50 Cup Size: n/a  Height: 6\' 1"  (1.854 m)  Weight:  251 lb (113.853 kg)  BMI:  Body mass index is 33.12 kg/(m^2). Tech Comments:  N/A    Nuclear Med Study 1 or 2 day study: 1 day  Stress Test Type:  Lexiscan  Reading MD: Mertie Moores, MD  Order Authorizing Provider:  Glori Bickers, MD  Resting Radionuclide: Technetium 34m Sestamibi  Resting Radionuclide Dose: 11.0 mCi   Stress Radionuclide:  Technetium 28m Sestamibi  Stress Radionuclide Dose: 33.0 mCi           Stress Protocol Rest HR: 78 Stress HR: 94  Rest BP: 167/86 Stress BP: 169/84  Exercise Time (min): n/a METS: n/a   Predicted Max HR: 157 bpm % Max HR: 57.32 bpm Rate Pressure Product: 15210    Dose of Adenosine (mg):  n/a Dose of Lexiscan: 0.4 mg  Dose of Atropine (mg): n/a Dose of Dobutamine: n/a mcg/kg/min (at max HR)  Stress Test Technologist: Crissie Figures, RN  Nuclear Technologist:  Charlton Amor, CNMT     Rest Procedure:  Myocardial perfusion imaging was performed at rest 45 minutes following the intravenous  administration of Technetium 3m Sestamibi. Rest ECG: NSR - Normal EKG  Stress Procedure:  The patient received IV Lexiscan 0.4 mg over 15-seconds.  Technetium 5m Sestamibi injected at 30-seconds.  Quantitative spect images were obtained after a 45 minute delay. Stress ECG: No significant change from baseline ECG  QPS Raw Data Images:  Normal; no motion artifact; normal heart/lung ratio. Stress Images:  The LV is markedly enlarged.  There is a small, severe defect in the apical segments with fairly normal uptake in the remaining segments.   Rest Images:  The LV is markedly enlarged.  There is a small, severe defect in the apical segments with fairly normal uptake in the remaining segments. Subtraction (SDS):  No evidence of ischemia. Transient Ischemic Dilatation (Normal <1.22):  1.03 Lung/Heart Ratio (Normal <0.45):  0.43  Quantitative Gated Spect Images QGS EDV:  231 ml QGS ESV:  154 ml  Impression Exercise Capacity:  Lexiscan with no exercise. BP Response:  Normal blood pressure response. Clinical Symptoms:  No significant symptoms noted. ECG Impression:  No significant ST segment change suggestive of ischemia. Comparison with Prior Nuclear Study:   The study is not significantly changed from the previous study 11/30/07.  Overall Impression:  Low risk stress nuclear study.  LV Ejection Fraction: 33%.  LV Wall Motion:  There is global hypokinesis.  The LV is markedly  enlarged.   Thayer Headings, Brooke Bonito., MD, [facility] 10/15/2012, 5:11 PM Office - [phone] Pager [phone]

## 2012-11-08 ENCOUNTER — Other Ambulatory Visit: Payer: Self-pay | Admitting: Nephrology

## 2012-11-08 DIAGNOSIS — Z992 Dependence on renal dialysis: Secondary | ICD-10-CM

## 2012-11-10 ENCOUNTER — Encounter (HOSPITAL_COMMUNITY): Payer: Self-pay

## 2012-11-10 ENCOUNTER — Ambulatory Visit (HOSPITAL_COMMUNITY)
Admission: RE | Admit: 2012-11-10 | Discharge: 2012-11-10 | Disposition: A | Discharge status: Home / Self Care | Payer: Medicare HMO | Source: Ambulatory Visit | Attending: Nephrology | Admitting: Nephrology

## 2012-11-10 ENCOUNTER — Other Ambulatory Visit: Payer: Self-pay | Admitting: Nephrology

## 2012-11-10 DIAGNOSIS — N186 End stage renal disease: Secondary | ICD-10-CM | POA: Insufficient documentation

## 2012-11-10 DIAGNOSIS — E119 Type 2 diabetes mellitus without complications: Secondary | ICD-10-CM | POA: Insufficient documentation

## 2012-11-10 DIAGNOSIS — I871 Compression of vein: Secondary | ICD-10-CM | POA: Insufficient documentation

## 2012-11-10 DIAGNOSIS — Z992 Dependence on renal dialysis: Secondary | ICD-10-CM

## 2012-11-10 DIAGNOSIS — I509 Heart failure, unspecified: Secondary | ICD-10-CM | POA: Insufficient documentation

## 2012-11-10 DIAGNOSIS — Y832 Surgical operation with anastomosis, bypass or graft as the cause of abnormal reaction of the patient, or of later complication, without mention of misadventure at the time of the procedure: Secondary | ICD-10-CM | POA: Insufficient documentation

## 2012-11-10 DIAGNOSIS — I120 Hypertensive chronic kidney disease with stage 5 chronic kidney disease or end stage renal disease: Secondary | ICD-10-CM | POA: Insufficient documentation

## 2012-11-10 DIAGNOSIS — I82609 Acute embolism and thrombosis of unspecified veins of unspecified upper extremity: Secondary | ICD-10-CM | POA: Insufficient documentation

## 2012-11-10 DIAGNOSIS — T82898A Other specified complication of vascular prosthetic devices, implants and grafts, initial encounter: Secondary | ICD-10-CM | POA: Insufficient documentation

## 2012-11-10 MED ORDER — FENTANYL CITRATE 0.05 MG/ML IJ SOLN
INTRAMUSCULAR | Status: AC
  Filled 2012-11-10: qty 4

## 2012-11-10 MED ORDER — MIDAZOLAM HCL 2 MG/2ML IJ SOLN
INTRAMUSCULAR | Status: DC | PRN
  Administered 2012-11-10 (×2): 1 mg via INTRAVENOUS

## 2012-11-10 MED ORDER — MIDAZOLAM HCL 2 MG/2ML IJ SOLN
INTRAMUSCULAR | Status: AC
  Filled 2012-11-10: qty 4

## 2012-11-10 MED ORDER — ALTEPLASE 100 MG IV SOLR
2.0000 mg | Freq: Once | INTRAVENOUS | Status: AC
  Administered 2012-11-10: 2 mg
  Filled 2012-11-10 (×2): qty 2

## 2012-11-10 MED ORDER — HEPARIN SODIUM (PORCINE) 1000 UNIT/ML IJ SOLN
INTRAMUSCULAR | Status: AC
  Administered 2012-11-10: 3000 [IU]
  Filled 2012-11-10: qty 1

## 2012-11-10 MED ORDER — IOHEXOL 300 MG/ML  SOLN
100.0000 mL | Freq: Once | INTRAMUSCULAR | Status: AC | PRN
  Administered 2012-11-10: 50 mL via INTRAVENOUS

## 2012-11-10 MED ORDER — FENTANYL CITRATE 0.05 MG/ML IJ SOLN
INTRAMUSCULAR | Status: DC | PRN
  Administered 2012-11-10 (×2): 50 ug via INTRAVENOUS

## 2012-11-10 NOTE — H&P (Signed)
Chief Complaint: "My AV Graft is clotted"  HPI: [name] is an 62 y.o. male with hx of ESRD. He has a (L)UE AVG that he has had for about a year. He had normal HD last Thurs, but then noticed a firm swollen knot on Fri and at HD on Sat, it was noted to be thrombosed. PMHx and meds reviewed. He has had declot procedure in his rt arm in the past. He feels well otherwise  Past Medical History:  Past Medical History  Diagnosis Date  . Hyperthyroidism   . GASTROENTERITIS, VIRAL 10/14/2009  . ONYCHOMYCOSIS, TOENAILS 12/26/2007  . GOITER, MULTINODULAR 12/26/2007  . HYPERTHYROIDISM 02/02/2010  . DIABETES MELLITUS, TYPE II 02/01/2010  . DYSLIPIDEMIA 06/18/2007  . GOUT 06/18/2007  . Hypocalcemia 06/07/2010  . DEMENTIA 09/02/2009  . DEPRESSION 10/14/2009  . PERIPHERAL NEUROPATHY 06/18/2007  . HYPERTENSION 06/18/2007  . CONGESTIVE HEART FAILURE 06/18/2007  . DIASTOLIC HEART FAILURE, CHRONIC 02/06/2009  . Unspecified hypotension 01/30/2010  . PULMONARY NODULE, RIGHT LOWER LOBE 06/08/2009  . GERD 06/18/2007  . ESRD 08/04/2010  . BENIGN PROSTATIC HYPERTROPHY 10/14/2009  . GYNECOMASTIA 07/17/2010  . NECK PAIN 07/31/2010  . FOOT PAIN 08/12/2008  . DIZZINESS 07/17/2010  . Other malaise and fatigue 11/24/2009  . GAIT DISTURBANCE 03/03/2010  . DYSPNEA 10/29/2008  . CHEST PAIN 03/29/2010  . TRANSAMINASES, SERUM, ELEVATED 02/01/2010  . COLONIC POLYPS, HX OF 10/14/2009  . Anemia 06/16/2011  . Ischemic cardiomyopathy 06/16/2011  . Hyperparathyroidism, secondary 06/16/2011  . OSA on CPAP 10/16/2011  . Hyperlipidemia 10/16/2011  . CAD, NATIVE VESSEL 02/06/2009    saw Dr. Missy Sabins last jan 2013  . Sleep apnea     cpap machine and o2    Past Surgical History:  Past Surgical History  Procedure Laterality Date  . Stent 06/11/08    . Neck sugury  2/09  . Back surgury  1998  . Givens capsule study  09/29/2011    Procedure: GIVENS CAPSULE STUDY;  Surgeon: Missy Sabins, MD;  Location: [facility];  Service: Endoscopy;   Laterality: Left;  . Esophagogastroduodenoscopy  09/28/2011    Procedure: ESOPHAGOGASTRODUODENOSCOPY (EGD);  Surgeon: Missy Sabins, MD;  Location: [facility] ENDOSCOPY;  Service: Endoscopy;  Laterality: N/A;  . Givens capsule study  09/30/2011    Procedure: GIVENS CAPSULE STUDY;  Surgeon: Jeryl Columbia, MD;  Location: [facility] ENDOSCOPY;  Service: Endoscopy;  Laterality: N/A;  . Cholecystectomy    . Cardiac catheterization    . Tonsillectomy    . Av fistula placement  11/07/2011    Procedure: INSERTION OF ARTERIOVENOUS (AV) GORE-TEX GRAFT ARM;  Surgeon: Tinnie Gens, MD;  Location: [facility];  Service: Vascular;  Laterality: Left;    Family History:  Family History  Problem Relation Age of Onset  . Heart disease Father   . Heart disease Sister     Social History:  reports that he has quit smoking. His smoking use included Cigarettes. He has a 25 pack-year smoking history. He has never used smokeless tobacco. He reports that  drinks alcohol. He reports that he does not use illicit drugs.  Allergies:  Allergies  Allergen Reactions  . Cephalexin Other (See Comments)    Tongue swelling  . Statins     REACTION: perceived myalgias    Medications: Current Outpatient Prescriptions on File Prior to Visit   Medication  Sig  Dispense  Refill   .  allopurinol (ZYLOPRIM) 100 MG tablet  Take 100 mg by mouth daily.     [name]  amitriptyline (ELAVIL) 50 MG tablet  Take 50 mg by mouth daily as needed. As needed when feeling depressed.     [name]  aspirin 81 MG EC tablet  Take 81 mg by mouth daily.     .  calcium-vitamin D (OSCAL WITH D) 500-200 MG-UNIT per tablet  Take 3 tablets by mouth 3 (three) times daily.     .  carvedilol (COREG) 12.5 MG tablet  Take 12.5 mg by mouth 2 (two) times daily with a meal.     .  cinacalcet (SENSIPAR) 60 MG tablet  Take 60 mg by mouth 2 (two) times daily.     .  Colesevelam HCl (WELCHOL) 3.75 G PACK  Take 3.75 g by mouth daily.     [name]  gabapentin (NEURONTIN) 300 MG capsule  Take 300 mg by  mouth at bedtime as needed.     .  multivitamin (RENA-VIT) TABS tablet  Take 1 tablet by mouth daily.     .  Omega-3 Fatty Acids (FISH OIL) 1000 MG CAPS  Take by mouth 3 (three) times daily.     .  ranitidine (ZANTAC) 300 MG capsule  Take 300 mg by mouth daily.     [name]  omeprazole (PRILOSEC) 20 MG capsule  Take 1 capsule (20 mg total) by mouth daily.        Please HPI for pertinent positives, otherwise complete 10 system ROS negative.  Physical Exam: Blood pressure 169/78, pulse 83, temperature 97.8 F (36.6 C), resp. rate 20, SpO2 99.00%. There is no weight on file to calculate BMI.   General Appearance:  Alert, cooperative, no distress, appears stated age  Head:  Normocephalic, without obvious abnormality, atraumatic  ENT: Unremarkable  Lungs:   Clear to auscultation bilaterally, no w/r/r, respirations unlabored without use of accessory muscles.  Chest Wall:  No tenderness or deformity  Heart:  Regular rate and rhythm, S1, S2 normal, no murmur, rub or gallop. Carotids 2+ without bruit.  Extremities: (L)UE AVG without palp pulse, thrill. Brachial pulse is present.  Pulses: 2+ and symmetric  Neurologic: Normal affect, no gross deficits.   No results found for this or any previous visit (from the past 48 hour(s)). No results found.  Assessment/Plan ESRD Thrombosed (L)UE AVG For thrombectomy, thrombolysis, possible angioplasty, possible HD catheter placement if not successful. Explained procedure, risks, complications.  No labs Consent signed in chart  Ascencion Dike PA-C 11/10/2012, 11:31 AM

## 2012-11-11 ENCOUNTER — Telehealth (HOSPITAL_COMMUNITY): Payer: Self-pay | Admitting: *Deleted

## 2012-11-25 ENCOUNTER — Other Ambulatory Visit (HOSPITAL_COMMUNITY): Payer: Self-pay | Admitting: Nephrology

## 2012-11-25 ENCOUNTER — Other Ambulatory Visit (HOSPITAL_COMMUNITY): Payer: Self-pay | Admitting: Interventional Radiology

## 2012-11-26 ENCOUNTER — Encounter (HOSPITAL_COMMUNITY): Payer: Self-pay

## 2012-11-26 ENCOUNTER — Encounter (HOSPITAL_COMMUNITY): Payer: Self-pay | Admitting: Pharmacy Technician

## 2012-11-26 ENCOUNTER — Ambulatory Visit (HOSPITAL_COMMUNITY)
Admission: RE | Admit: 2012-11-26 | Discharge: 2012-11-26 | Disposition: A | Discharge status: Home / Self Care | Payer: Medicare HMO | Source: Ambulatory Visit | Attending: Nephrology | Admitting: Nephrology

## 2012-11-26 ENCOUNTER — Other Ambulatory Visit (HOSPITAL_COMMUNITY): Payer: Self-pay | Admitting: Nephrology

## 2012-11-26 VITALS — BP 147/73 | HR 74 | Temp 98.2°F | Resp 18

## 2012-11-26 DIAGNOSIS — I120 Hypertensive chronic kidney disease with stage 5 chronic kidney disease or end stage renal disease: Secondary | ICD-10-CM | POA: Insufficient documentation

## 2012-11-26 DIAGNOSIS — N186 End stage renal disease: Secondary | ICD-10-CM | POA: Insufficient documentation

## 2012-11-26 DIAGNOSIS — T82898A Other specified complication of vascular prosthetic devices, implants and grafts, initial encounter: Secondary | ICD-10-CM | POA: Insufficient documentation

## 2012-11-26 DIAGNOSIS — E785 Hyperlipidemia, unspecified: Secondary | ICD-10-CM | POA: Insufficient documentation

## 2012-11-26 DIAGNOSIS — I509 Heart failure, unspecified: Secondary | ICD-10-CM | POA: Insufficient documentation

## 2012-11-26 DIAGNOSIS — E119 Type 2 diabetes mellitus without complications: Secondary | ICD-10-CM | POA: Insufficient documentation

## 2012-11-26 DIAGNOSIS — Z992 Dependence on renal dialysis: Secondary | ICD-10-CM | POA: Insufficient documentation

## 2012-11-26 DIAGNOSIS — Y832 Surgical operation with anastomosis, bypass or graft as the cause of abnormal reaction of the patient, or of later complication, without mention of misadventure at the time of the procedure: Secondary | ICD-10-CM | POA: Insufficient documentation

## 2012-11-26 MED ORDER — HEPARIN SODIUM (PORCINE) 1000 UNIT/ML IJ SOLN
INTRAMUSCULAR | Status: AC
  Administered 2012-11-26: 3000 [IU] via INTRAVENOUS
  Filled 2012-11-26: qty 1

## 2012-11-26 MED ORDER — ALTEPLASE 100 MG IV SOLR
2.0000 mg | Freq: Once | INTRAVENOUS | Status: AC
  Administered 2012-11-26: 2 mg
  Filled 2012-11-26: qty 2

## 2012-11-26 MED ORDER — ALTEPLASE 100 MG IV SOLR: 2.0000 mg | Freq: Once | INTRAVENOUS | Status: DC

## 2012-11-26 MED ORDER — MIDAZOLAM HCL 2 MG/2ML IJ SOLN
INTRAMUSCULAR | Status: AC
  Filled 2012-11-26: qty 4

## 2012-11-26 MED ORDER — MIDAZOLAM HCL 2 MG/2ML IJ SOLN
INTRAMUSCULAR | Status: AC | PRN
  Administered 2012-11-26: 2 mg via INTRAVENOUS

## 2012-11-26 MED ORDER — IOHEXOL 300 MG/ML  SOLN
100.0000 mL | Freq: Once | INTRAMUSCULAR | Status: AC | PRN
Start: 2012-11-26 — End: 2012-11-26
  Administered 2012-11-26: 50 mL via INTRAVENOUS

## 2012-11-26 MED ORDER — FENTANYL CITRATE 0.05 MG/ML IJ SOLN
INTRAMUSCULAR | Status: AC | PRN
  Administered 2012-11-26: 50 ug via INTRAVENOUS

## 2012-11-26 MED ORDER — SODIUM CHLORIDE 0.9 % IV SOLN
INTRAVENOUS | Status: AC | PRN
  Administered 2012-11-26: 50 mL/h via INTRAVENOUS

## 2012-11-26 MED ORDER — FENTANYL CITRATE 0.05 MG/ML IJ SOLN
INTRAMUSCULAR | Status: AC
  Filled 2012-11-26: qty 2

## 2012-11-26 MED ORDER — FENTANYL CITRATE 0.05 MG/ML IJ SOLN
INTRAMUSCULAR | Status: AC
  Filled 2012-11-26: qty 4

## 2012-11-26 NOTE — ED Notes (Signed)
Pt taken to car, via wheelchair, and helped into passenger side of car

## 2012-11-26 NOTE — H&P (Signed)
[name] is an 62 y.o. male.   Chief Complaint: left upper arm dialysis graft clotted Last use: 2/21: successful Last attempt: 2/25- clotted Last intervention: 2/10: successful declot Scheduled now for left arm dialysis graft thrombolysis and possible angioplasty/stent placement Possible dialysis catheter placment HPI: ESRD; hyperthyroid; DM; HLD; gout; neuropathy; HTN; CHF  Past Medical History  Diagnosis Date  . Hyperthyroidism   . GASTROENTERITIS, VIRAL 10/14/2009  . ONYCHOMYCOSIS, TOENAILS 12/26/2007  . GOITER, MULTINODULAR 12/26/2007  . HYPERTHYROIDISM 02/02/2010  . DIABETES MELLITUS, TYPE II 02/01/2010  . DYSLIPIDEMIA 06/18/2007  . GOUT 06/18/2007  . Hypocalcemia 06/07/2010  . DEMENTIA 09/02/2009  . DEPRESSION 10/14/2009  . PERIPHERAL NEUROPATHY 06/18/2007  . HYPERTENSION 06/18/2007  . CONGESTIVE HEART FAILURE 06/18/2007  . DIASTOLIC HEART FAILURE, CHRONIC 02/06/2009  . Unspecified hypotension 01/30/2010  . PULMONARY NODULE, RIGHT LOWER LOBE 06/08/2009  . GERD 06/18/2007  . ESRD 08/04/2010  . BENIGN PROSTATIC HYPERTROPHY 10/14/2009  . GYNECOMASTIA 07/17/2010  . NECK PAIN 07/31/2010  . FOOT PAIN 08/12/2008  . DIZZINESS 07/17/2010  . Other malaise and fatigue 11/24/2009  . GAIT DISTURBANCE 03/03/2010  . DYSPNEA 10/29/2008  . CHEST PAIN 03/29/2010  . TRANSAMINASES, SERUM, ELEVATED 02/01/2010  . COLONIC POLYPS, HX OF 10/14/2009  . Anemia 06/16/2011  . Ischemic cardiomyopathy 06/16/2011  . Hyperparathyroidism, secondary 06/16/2011  . OSA on CPAP 10/16/2011  . Hyperlipidemia 10/16/2011  . CAD, NATIVE VESSEL 02/06/2009    saw Dr. Missy Sabins last jan 2013  . Sleep apnea     cpap machine and o2    Past Surgical History  Procedure Laterality Date  . Stent 06/11/08    . Neck sugury  2/09  . Back surgury  1998  . Givens capsule study  09/29/2011    Procedure: GIVENS CAPSULE STUDY;  Surgeon: Missy Sabins, MD;  Location: [place];  Service: Endoscopy;  Laterality: Left;  .  Esophagogastroduodenoscopy  09/28/2011    Procedure: ESOPHAGOGASTRODUODENOSCOPY (EGD);  Surgeon: Missy Sabins, MD;  Location: [facility] ENDOSCOPY;  Service: Endoscopy;  Laterality: N/A;  . Givens capsule study  09/30/2011    Procedure: GIVENS CAPSULE STUDY;  Surgeon: Jeryl Columbia, MD;  Location: [facility] ENDOSCOPY;  Service: Endoscopy;  Laterality: N/A;  . Cholecystectomy    . Cardiac catheterization    . Tonsillectomy    . Av fistula placement  11/07/2011    Procedure: INSERTION OF ARTERIOVENOUS (AV) GORE-TEX GRAFT ARM;  Surgeon: Tinnie Gens, MD;  Location: [place];  Service: Vascular;  Laterality: Left;    Family History  Problem Relation Age of Onset  . Heart disease Father   . Heart disease Sister    Social History:  reports that he has quit smoking. His smoking use included Cigarettes. He has a 25 pack-year smoking history. He has never used smokeless tobacco. He reports that  drinks alcohol. He reports that he does not use illicit drugs.  Allergies:  Allergies  Allergen Reactions  . Cephalexin Other (See Comments)    Tongue swelling  . Statins     REACTION: perceived myalgias     (Not in a hospital admission)  No results found for this or any previous visit (from the past 48 hour(s)). No results found.  Review of Systems  Constitutional: Negative for fever and chills.  Respiratory: Negative for shortness of breath.   Cardiovascular: Negative for chest pain.  Gastrointestinal: Negative for nausea, vomiting and abdominal pain.  Neurological: Negative for headaches.    Blood pressure 146/74, pulse 80, temperature  98.2 F (36.8 C), SpO2 95.00%. Physical Exam  Constitutional: He is oriented to person, place, and time. He appears well-developed and well-nourished.  Cardiovascular: Normal rate, regular rhythm and normal heart sounds.   No murmur heard. Respiratory: Effort normal and breath sounds normal. He has no wheezes.  GI: Soft. Bowel sounds are normal. He exhibits distension.   Musculoskeletal: Normal range of motion.  Gait slow  Neurological: He is alert and oriented to person, place, and time.  Psychiatric: He has a normal mood and affect. His behavior is normal. Judgment and thought content normal.     Assessment/Plan Left arm dial graft clotted Scheduled for thrombolysis and poss pta/stent placement Poss dialysis catheter if needed Pt aware of procedure benefits and risks and agreeable to proceed Consent signed and in chart  Idalie Canto A 11/26/2012, 12:05 PM

## 2012-11-26 NOTE — Procedures (Signed)
Declot LUA graft 82mm covered stent @ recurrent venous stenosis No complication No blood loss. See complete dictation in [facility].

## 2012-11-26 NOTE — ED Notes (Signed)
Discharge instructions given to pt and his wife 

## 2012-11-27 ENCOUNTER — Telehealth (HOSPITAL_COMMUNITY): Payer: Self-pay | Admitting: *Deleted

## 2012-12-08 ENCOUNTER — Other Ambulatory Visit (HOSPITAL_COMMUNITY): Payer: Self-pay | Admitting: Nephrology

## 2012-12-14 ENCOUNTER — Other Ambulatory Visit (HOSPITAL_COMMUNITY): Payer: Self-pay | Admitting: Interventional Radiology

## 2012-12-14 ENCOUNTER — Ambulatory Visit (HOSPITAL_COMMUNITY)
Admission: RE | Admit: 2012-12-14 | Discharge: 2012-12-14 | Disposition: A | Discharge status: Home / Self Care | Payer: Medicare HMO | Source: Ambulatory Visit | Attending: Interventional Radiology | Admitting: Interventional Radiology

## 2012-12-14 DIAGNOSIS — N186 End stage renal disease: Secondary | ICD-10-CM | POA: Insufficient documentation

## 2012-12-14 DIAGNOSIS — Z4901 Encounter for fitting and adjustment of extracorporeal dialysis catheter: Secondary | ICD-10-CM | POA: Insufficient documentation

## 2012-12-14 DIAGNOSIS — T82898A Other specified complication of vascular prosthetic devices, implants and grafts, initial encounter: Secondary | ICD-10-CM | POA: Insufficient documentation

## 2012-12-14 DIAGNOSIS — Y832 Surgical operation with anastomosis, bypass or graft as the cause of abnormal reaction of the patient, or of later complication, without mention of misadventure at the time of the procedure: Secondary | ICD-10-CM | POA: Insufficient documentation

## 2012-12-14 DIAGNOSIS — Z992 Dependence on renal dialysis: Secondary | ICD-10-CM | POA: Insufficient documentation

## 2012-12-14 LAB — POTASSIUM: Potassium: 4.7 mEq/L (ref 3.5–5.1)

## 2012-12-14 MED ORDER — MIDAZOLAM HCL 2 MG/2ML IJ SOLN
INTRAMUSCULAR | Status: AC
  Filled 2012-12-14: qty 2

## 2012-12-14 MED ORDER — FENTANYL CITRATE 0.05 MG/ML IJ SOLN
INTRAMUSCULAR | Status: AC
  Filled 2012-12-14: qty 2

## 2012-12-14 MED ORDER — MIDAZOLAM HCL 2 MG/2ML IJ SOLN
INTRAMUSCULAR | Status: AC | PRN
  Administered 2012-12-14: 1 mg via INTRAVENOUS

## 2012-12-14 MED ORDER — GELATIN ABSORBABLE 12-7 MM EX MISC
CUTANEOUS | Status: AC
  Filled 2012-12-14: qty 1

## 2012-12-14 MED ORDER — FENTANYL CITRATE 0.05 MG/ML IJ SOLN
INTRAMUSCULAR | Status: AC | PRN
  Administered 2012-12-14: 50 ug via INTRAVENOUS

## 2012-12-14 MED ORDER — VANCOMYCIN HCL IN DEXTROSE 1-5 GM/200ML-% IV SOLN
1000.0000 mg | Freq: Once | INTRAVENOUS | Status: AC
  Administered 2012-12-14: 1000 mg via INTRAVENOUS
  Filled 2012-12-14: qty 200

## 2012-12-14 MED ORDER — DESMOPRESSIN ACETATE 4 MCG/ML IJ SOLN
0.3000 ug/kg | Freq: Once | INTRAMUSCULAR | Status: AC
  Administered 2012-12-14: 34 ug via INTRAVENOUS
  Filled 2012-12-14: qty 8.5

## 2012-12-14 MED ORDER — HEPARIN SODIUM (PORCINE) 1000 UNIT/ML IJ SOLN
INTRAMUSCULAR | Status: AC
  Filled 2012-12-14: qty 1

## 2012-12-14 NOTE — Procedures (Signed)
23 cm RIJV HD catheter Tip RA No comp

## 2012-12-14 NOTE — H&P (Signed)
[name] is an 62 y.o. male.   Chief Complaint: ESRD HPI: Thrombosed AVG. HD catheter requested.  Past Medical History  Diagnosis Date  . Hyperthyroidism   . GASTROENTERITIS, VIRAL 10/14/2009  . ONYCHOMYCOSIS, TOENAILS 12/26/2007  . GOITER, MULTINODULAR 12/26/2007  . HYPERTHYROIDISM 02/02/2010  . DIABETES MELLITUS, TYPE II 02/01/2010  . DYSLIPIDEMIA 06/18/2007  . GOUT 06/18/2007  . Hypocalcemia 06/07/2010  . DEMENTIA 09/02/2009  . DEPRESSION 10/14/2009  . PERIPHERAL NEUROPATHY 06/18/2007  . HYPERTENSION 06/18/2007  . CONGESTIVE HEART FAILURE 06/18/2007  . DIASTOLIC HEART FAILURE, CHRONIC 02/06/2009  . Unspecified hypotension 01/30/2010  . PULMONARY NODULE, RIGHT LOWER LOBE 06/08/2009  . GERD 06/18/2007  . ESRD 08/04/2010  . BENIGN PROSTATIC HYPERTROPHY 10/14/2009  . GYNECOMASTIA 07/17/2010  . NECK PAIN 07/31/2010  . FOOT PAIN 08/12/2008  . DIZZINESS 07/17/2010  . Other malaise and fatigue 11/24/2009  . GAIT DISTURBANCE 03/03/2010  . DYSPNEA 10/29/2008  . CHEST PAIN 03/29/2010  . TRANSAMINASES, SERUM, ELEVATED 02/01/2010  . COLONIC POLYPS, HX OF 10/14/2009  . Anemia 06/16/2011  . Ischemic cardiomyopathy 06/16/2011  . Hyperparathyroidism, secondary 06/16/2011  . OSA on CPAP 10/16/2011  . Hyperlipidemia 10/16/2011  . CAD, NATIVE VESSEL 02/06/2009    saw Dr. Missy Sabins last jan 2013  . Sleep apnea     cpap machine and o2    Past Surgical History  Procedure Laterality Date  . Stent 06/11/08    . Neck sugury  2/09  . Back surgury  1998  . Givens capsule study  09/29/2011    Procedure: GIVENS CAPSULE STUDY;  Surgeon: Missy Sabins, MD;  Location: [place];  Service: Endoscopy;  Laterality: Left;  . Esophagogastroduodenoscopy  09/28/2011    Procedure: ESOPHAGOGASTRODUODENOSCOPY (EGD);  Surgeon: Missy Sabins, MD;  Location: [facility] ENDOSCOPY;  Service: Endoscopy;  Laterality: N/A;  . Givens capsule study  09/30/2011    Procedure: GIVENS CAPSULE STUDY;  Surgeon: Jeryl Columbia, MD;  Location: [facility] ENDOSCOPY;   Service: Endoscopy;  Laterality: N/A;  . Cholecystectomy    . Cardiac catheterization    . Tonsillectomy    . Av fistula placement  11/07/2011    Procedure: INSERTION OF ARTERIOVENOUS (AV) GORE-TEX GRAFT ARM;  Surgeon: Tinnie Gens, MD;  Location: [place];  Service: Vascular;  Laterality: Left;    Family History  Problem Relation Age of Onset  . Heart disease Father   . Heart disease Sister    Social History:  reports that he has quit smoking. His smoking use included Cigarettes. He has a 25 pack-year smoking history. He has never used smokeless tobacco. He reports that  drinks alcohol. He reports that he does not use illicit drugs.  Allergies:  Allergies  Allergen Reactions  . Cephalexin Other (See Comments)    Tongue swelling  . Statins     REACTION: perceived myalgias     (Not in a hospital admission)  No results found for this or any previous visit (from the past 48 hour(s)). No results found.  ROS  Blood pressure 140/76, pulse 76, temperature 98.6 F (37 C), resp. rate 17, SpO2 100.00%. Physical Exam  Constitutional: He is oriented to person, place, and time. He appears well-developed and well-nourished.  HENT:  Head: Normocephalic and atraumatic.  Neck: Normal range of motion.  Cardiovascular: Normal rate and regular rhythm.   Respiratory: Effort normal and breath sounds normal.  GI: Soft.  Musculoskeletal: Normal range of motion.  Neurological: He is alert and oriented to person, place, and time.  Assessment/Plan HD catheter.  Syrenity Klepacki,ART A 12/14/2012, 8:14 AM

## 2013-01-05 ENCOUNTER — Other Ambulatory Visit: Payer: Self-pay

## 2013-01-05 MED ORDER — COLESEVELAM HCL 3.75 G PO PACK: 3.7500 g | PACK | Freq: Every day | ORAL | Status: DC

## 2013-01-07 ENCOUNTER — Ambulatory Visit (INDEPENDENT_AMBULATORY_CARE_PROVIDER_SITE_OTHER): Payer: Medicare HMO | Admitting: Internal Medicine

## 2013-01-07 ENCOUNTER — Encounter: Payer: Self-pay | Admitting: Internal Medicine

## 2013-01-07 VITALS — BP 122/70 | HR 86 | Temp 98.3°F | Ht 73.0 in | Wt 253.4 lb

## 2013-01-07 DIAGNOSIS — E785 Hyperlipidemia, unspecified: Secondary | ICD-10-CM

## 2013-01-07 DIAGNOSIS — I10 Essential (primary) hypertension: Secondary | ICD-10-CM

## 2013-01-07 DIAGNOSIS — F329 Major depressive disorder, single episode, unspecified: Secondary | ICD-10-CM

## 2013-01-07 DIAGNOSIS — E119 Type 2 diabetes mellitus without complications: Secondary | ICD-10-CM

## 2013-01-07 MED ORDER — COLESEVELAM HCL 625 MG PO TABS: 1875.0000 mg | ORAL_TABLET | Freq: Two times a day (BID) | ORAL | Status: DC

## 2013-01-07 MED ORDER — CITALOPRAM HYDROBROMIDE 10 MG PO TABS: 10.0000 mg | ORAL_TABLET | Freq: Every day | ORAL | Status: DC

## 2013-01-07 MED ORDER — GABAPENTIN 300 MG PO CAPS: 300.0000 mg | ORAL_CAPSULE | Freq: Every evening | ORAL | Status: DC | PRN

## 2013-01-07 NOTE — Assessment & Plan Note (Signed)
For citalopram 10 qd, delcines counseling

## 2013-01-07 NOTE — Assessment & Plan Note (Signed)
Also need f/u with endo  - will help arrange

## 2013-01-07 NOTE — Progress Notes (Signed)
Subjective:    Patient ID: [name], male    DOB: [birth year], 62 y.o.   MRN: [id number]  HPI  Here to f/u with wife to help with hx  Pt with dementia,; overall doing ok,  Pt denies chest pain, increased sob or doe, wheezing, orthopnea, PND, increased LE swelling, palpitations, dizziness or syncope.  Pt denies polydipsia, polyuria, or low sugar symptoms.  Pt denies new neurological symptoms such as new headache, or facial or extremity weakness or numbness except for numbness intermittent maybe twice per wk to the right hand, mild, ? Positional, but not especially worse in the am..   Pt states overall good compliance with meds, has been trying to follow lower cholesterol, diabetic diet, with wt overall stable,  but little exercise however. Does not currently have appt with Dr Loanne Drilling for sugar.  Dementia overall stable symptomatically and not assoc with behavioral changes such as hallucinations, paranoia, or agitation.  Wife states he has had access issues, now with catheter placed for dialysis.  Does c/o ongoing fatigue, but no signficant daytime hypersomnolence, no naps during the day, does use CPAP.  Pt denies fever, wt loss, night sweats, loss of appetite, or other constitutional symptoms.  Has had mild worsening depressive symptoms, but no suicidal ideation, or panic Past Medical History  Diagnosis Date  . Hyperthyroidism   . GASTROENTERITIS, VIRAL 10/14/2009  . ONYCHOMYCOSIS, TOENAILS 12/26/2007  . GOITER, MULTINODULAR 12/26/2007  . HYPERTHYROIDISM 02/02/2010  . DIABETES MELLITUS, TYPE II 02/01/2010  . DYSLIPIDEMIA 06/18/2007  . GOUT 06/18/2007  . Hypocalcemia 06/07/2010  . DEMENTIA 09/02/2009  . DEPRESSION 10/14/2009  . PERIPHERAL NEUROPATHY 06/18/2007  . HYPERTENSION 06/18/2007  . CONGESTIVE HEART FAILURE 06/18/2007  . DIASTOLIC HEART FAILURE, CHRONIC 02/06/2009  . Unspecified hypotension 01/30/2010  . PULMONARY NODULE, RIGHT LOWER LOBE 06/08/2009  . GERD 06/18/2007  . ESRD 08/04/2010  . BENIGN  PROSTATIC HYPERTROPHY 10/14/2009  . GYNECOMASTIA 07/17/2010  . NECK PAIN 07/31/2010  . FOOT PAIN 08/12/2008  . DIZZINESS 07/17/2010  . Other malaise and fatigue 11/24/2009  . GAIT DISTURBANCE 03/03/2010  . DYSPNEA 10/29/2008  . CHEST PAIN 03/29/2010  . TRANSAMINASES, SERUM, ELEVATED 02/01/2010  . COLONIC POLYPS, HX OF 10/14/2009  . Anemia 06/16/2011  . Ischemic cardiomyopathy 06/16/2011  . Hyperparathyroidism, secondary 06/16/2011  . OSA on CPAP 10/16/2011  . Hyperlipidemia 10/16/2011  . CAD, NATIVE VESSEL 02/06/2009    saw Dr. Missy Sabins last jan 2013  . Sleep apnea     cpap machine and o2   Past Surgical History  Procedure Laterality Date  . Stent 06/11/08    . Neck sugury  2/09  . Back surgury  1998  . Givens capsule study  09/29/2011    Procedure: GIVENS CAPSULE STUDY;  Surgeon: Missy Sabins, MD;  Location: [place];  Service: Endoscopy;  Laterality: Left;  . Esophagogastroduodenoscopy  09/28/2011    Procedure: ESOPHAGOGASTRODUODENOSCOPY (EGD);  Surgeon: Missy Sabins, MD;  Location: [facility] ENDOSCOPY;  Service: Endoscopy;  Laterality: N/A;  . Givens capsule study  09/30/2011    Procedure: GIVENS CAPSULE STUDY;  Surgeon: Jeryl Columbia, MD;  Location: [facility] ENDOSCOPY;  Service: Endoscopy;  Laterality: N/A;  . Cholecystectomy    . Cardiac catheterization    . Tonsillectomy    . Av fistula placement  11/07/2011    Procedure: INSERTION OF ARTERIOVENOUS (AV) GORE-TEX GRAFT ARM;  Surgeon: Tinnie Gens, MD;  Location: [place];  Service: Vascular;  Laterality: Left;    reports that he has  quit smoking. His smoking use included Cigarettes. He has a 25 pack-year smoking history. He has never used smokeless tobacco. He reports that  drinks alcohol. He reports that he does not use illicit drugs. family history includes Heart disease in his father and sister. Allergies  Allergen Reactions  . Cephalexin Other (See Comments)    Tongue swelling  . Statins     REACTION: perceived myalgias   Current Outpatient  Prescriptions on File Prior to Visit  Medication Sig Dispense Refill  . allopurinol (ZYLOPRIM) 100 MG tablet Take 100 mg by mouth daily.        [name] amitriptyline (ELAVIL) 50 MG tablet Take 50 mg by mouth daily as needed. As needed when feeling depressed.      [name] aspirin 81 MG EC tablet Take 81 mg by mouth daily.        . calcium-vitamin D (OSCAL WITH D) 500-200 MG-UNIT per tablet Take 3 tablets by mouth 3 (three) times daily.       . carvedilol (COREG) 25 MG tablet Take 25 mg by mouth 2 (two) times daily with a meal.      . cinacalcet (SENSIPAR) 60 MG tablet Take 60 mg by mouth 2 (two) times daily.        . clomiPHENE (CLOMID) 50 MG tablet Take 12.5 mg by mouth daily. 1/4 tab daily      . lanthanum (FOSRENOL) 1000 MG chewable tablet Chew 1,000 mg by mouth 2 times daily at 12 noon and 4 pm.      . multivitamin (RENA-VIT) TABS tablet Take 1 tablet by mouth daily.        . Omega-3 Fatty Acids (FISH OIL) 1000 MG CAPS Take 1 capsule by mouth 2 (two) times daily.       [name] omeprazole (PRILOSEC) 20 MG capsule Take 20 mg by mouth daily.       No current facility-administered medications on file prior to visit.   Review of Systems  Constitutional: Negative for unexpected weight change, or unusual diaphoresis  HENT: Negative for tinnitus.   Eyes: Negative for photophobia and visual disturbance.  Respiratory: Negative for choking and stridor.   Gastrointestinal: Negative for vomiting and blood in stool.  Genitourinary: Negative for hematuria and decreased urine volume.  Musculoskeletal: Negative for acute joint swelling Skin: Negative for color change and wound.  Neurological: Negative for tremors and numbness other than noted  Psychiatric/Behavioral: Negative for decreased concentration or  hyperactivity.       Objective:   Physical Exam BP 122/70  Pulse 86  Temp(Src) 98.3 F (36.8 C) (Oral)  Ht 6\' 1"  (1.854 m)  Wt 253 lb 6 oz (114.93 kg)  BMI 33.44 kg/m2  SpO2 97% VS noted, fatigued  appaering Constitutional: Pt appears well-developed and well-nourished.  HENT: Head: NCAT.  Right Ear: External ear normal.  Left Ear: External ear normal.  Eyes: Conjunctivae and EOM are normal. Pupils are equal, round, and reactive to light.  Neck: Normal range of motion. Neck supple.  Cardiovascular: Normal rate and regular rhythm.   Pulmonary/Chest: Effort normal and breath sounds normal.  Abd:  Soft, NT, non-distended, + BS Neurological: Pt is alert. Not confused  Skin: Skin is warm. No erythema.  Psychiatric: Pt behavior is normal. Thought content normal. Depressed affect     Assessment & Plan:

## 2013-01-07 NOTE — Assessment & Plan Note (Signed)
Ok to change powder welchol to pills,  to f/u any worsening symptoms or concerns Lab Results  Component Value Date   [facility]  Value: 343        Total Cholesterol/HDL:CHD Risk Coronary Heart Disease Risk Table                     Men   Women  1/2 Average Risk   3.4   3.3  Average Risk       5.0   4.4  2 X Average Risk   9.6   7.1  3 X Average Risk  23.4   11.0        Use the calculated Patient Ratio above and the CHD Risk Table to determine the patient's CHD Risk.        ATP III CLASSIFICATION (LDL):  <100     mg/dL   Optimal  100-129  mg/dL   Near or Above                    Optimal  130-159  mg/dL   Borderline  160-189  mg/dL   High  >190     mg/dL   Very High* 12/28/2009

## 2013-01-07 NOTE — Assessment & Plan Note (Signed)
stable overall by history and exam, recent data reviewed with pt, and pt to continue medical treatment as before,  to f/u any worsening symptoms or concerns BP Readings from Last 3 Encounters:  01/07/13 122/70  12/14/12 164/85  11/26/12 147/73

## 2013-01-07 NOTE — Patient Instructions (Addendum)
Please take all new medication as prescribed - the low dose medication for depression (citalopram - sent to [facility] as this is on the $4 list) Please remember to make a follow up appt with Dr Loanne Drilling (this can be done at the check out today) Please continue all other medications as before, and refills have been done if requested (the powder welchol was changed to the pills); and you are given some pill samples today as well Please have the pharmacy call with any other refills you may need. Please keep your appointments with your specialists as you have planned Please return in 6 months, or sooner if needed

## 2013-01-13 ENCOUNTER — Encounter: Payer: Self-pay | Admitting: Vascular Surgery

## 2013-01-14 ENCOUNTER — Encounter (HOSPITAL_COMMUNITY): Payer: Self-pay

## 2013-01-14 ENCOUNTER — Ambulatory Visit (INDEPENDENT_AMBULATORY_CARE_PROVIDER_SITE_OTHER): Payer: Medicare HMO | Admitting: Vascular Surgery

## 2013-01-14 ENCOUNTER — Encounter: Payer: Self-pay | Admitting: Vascular Surgery

## 2013-01-14 ENCOUNTER — Other Ambulatory Visit: Payer: Self-pay

## 2013-01-14 VITALS — BP 113/62 | HR 80 | Ht 73.0 in | Wt 250.7 lb

## 2013-01-14 DIAGNOSIS — T82898A Other specified complication of vascular prosthetic devices, implants and grafts, initial encounter: Secondary | ICD-10-CM

## 2013-01-14 DIAGNOSIS — N186 End stage renal disease: Secondary | ICD-10-CM

## 2013-01-14 NOTE — Progress Notes (Signed)
[facility] [place]  Patient name: [name] MRN: [id number] DOB: [birth year] Sex: male  REASON FOR VISIT: Evaluate for new hemodialysis access.  HPI: This is a pleasant 62 year old gentleman who currently is dialyzing with a right IJ tunneled dialysis catheter. His vascular access history as documented below.  07/09/2008: Placement of new right forearm AV graft 05/17/2010: Thrombectomy and revision of right arm AV graft [id number] first age basilic vein transposition in the left arm 10/09/2010 seconds days left basilic vein transposition 02/21/2011: venoplasty of right cephalic vein in the right brachiocephalic vein. 11/07/2011: Left upper arm AV graft. This clotted on 11/10/2012, to 2514, and 12/13/2012. Of note, on 11/26/2012, he had placement of a covered stent across the venous anastomosis.  He dialyzes on Tuesdays Thursdays and Saturdays. He denies any recent uremic symptoms. Specifically he denies nausea, vomiting, fatigue, anorexia, or palpitations.   Past Medical History  Diagnosis Date  . Hyperthyroidism   . GASTROENTERITIS, VIRAL 10/14/2009  . ONYCHOMYCOSIS, TOENAILS 12/26/2007  . GOITER, MULTINODULAR 12/26/2007  . HYPERTHYROIDISM 02/02/2010  . DIABETES MELLITUS, TYPE II 02/01/2010  . DYSLIPIDEMIA 06/18/2007  . GOUT 06/18/2007  . Hypocalcemia 06/07/2010  . DEMENTIA 09/02/2009  . DEPRESSION 10/14/2009  . PERIPHERAL NEUROPATHY 06/18/2007  . HYPERTENSION 06/18/2007  . CONGESTIVE HEART FAILURE 06/18/2007  . DIASTOLIC HEART FAILURE, CHRONIC 02/06/2009  . Unspecified hypotension 01/30/2010  . PULMONARY NODULE, RIGHT LOWER LOBE 06/08/2009  . GERD 06/18/2007  . ESRD 08/04/2010  . BENIGN PROSTATIC HYPERTROPHY 10/14/2009  . GYNECOMASTIA 07/17/2010  . NECK PAIN 07/31/2010  . FOOT PAIN 08/12/2008  . DIZZINESS 07/17/2010  . Other malaise and fatigue 11/24/2009  . GAIT DISTURBANCE 03/03/2010  . DYSPNEA 10/29/2008  . CHEST PAIN 03/29/2010  . TRANSAMINASES, SERUM, ELEVATED  02/01/2010  . COLONIC POLYPS, HX OF 10/14/2009  . Anemia 06/16/2011  . Ischemic cardiomyopathy 06/16/2011  . Hyperparathyroidism, secondary 06/16/2011  . OSA on CPAP 10/16/2011  . Hyperlipidemia 10/16/2011  . CAD, NATIVE VESSEL 02/06/2009    saw Dr. Missy Sabins last jan 2013  . Sleep apnea     cpap machine and o2    Family History  Problem Relation Age of Onset  . Heart disease Father   . Heart disease Sister     SOCIAL HISTORY: History  Substance Use Topics  . Smoking status: Former Smoker -- 1.00 packs/day for 25 years    Types: Cigarettes  . Smokeless tobacco: Never Used     Comment: Quit smoking 2007 Smoked x 25 years 1/2 ppd.  . Alcohol Use: Yes     Comment: occassional     Allergies  Allergen Reactions  . Cephalexin Other (See Comments)    Tongue swelling  . Statins     REACTION: perceived myalgias    Current Outpatient Prescriptions  Medication Sig Dispense Refill  . allopurinol (ZYLOPRIM) 100 MG tablet Take 100 mg by mouth daily.        [name] amitriptyline (ELAVIL) 50 MG tablet Take 50 mg by mouth daily as needed. As needed when feeling depressed.      [name] aspirin 81 MG EC tablet Take 81 mg by mouth daily.        . calcium-vitamin D (OSCAL WITH D) 500-200 MG-UNIT per tablet Take 3 tablets by mouth 3 (three) times daily.       . carvedilol (COREG) 25 MG tablet Take 25 mg by mouth 2 (two) times daily with a meal.      . cinacalcet (SENSIPAR) 60 MG tablet  Take 60 mg by mouth 2 (two) times daily.        . citalopram (CELEXA) 10 MG tablet Take 1 tablet (10 mg total) by mouth daily.  90 tablet  3  . clomiPHENE (CLOMID) 50 MG tablet Take 12.5 mg by mouth daily. 1/4 tab daily      . colesevelam (WELCHOL) 625 MG tablet Take 3 tablets (1,875 mg total) by mouth 2 (two) times daily with a meal.  540 tablet  3  . gabapentin (NEURONTIN) 300 MG capsule Take 1 capsule (300 mg total) by mouth at bedtime as needed. For pain  90 capsule  1  . lanthanum (FOSRENOL) 1000 MG chewable tablet Chew  1,000 mg by mouth 2 times daily at 12 noon and 4 pm.      . multivitamin (RENA-VIT) TABS tablet Take 1 tablet by mouth daily.        . Omega-3 Fatty Acids (FISH OIL) 1000 MG CAPS Take 1 capsule by mouth 2 (two) times daily.       [name] omeprazole (PRILOSEC) 20 MG capsule Take 20 mg by mouth daily.       No current facility-administered medications for this visit.    REVIEW OF SYSTEMS: Valu.Nieves ] denotes positive finding; [  ] denotes negative finding  CARDIOVASCULAR:  [ ]  chest pain   [ ]  chest pressure   [ ]  palpitations   [ ]  orthopnea   [ ]  dyspnea on exertion   [ ]  claudication   [ ]  rest pain   [ ]  DVT   [ ]  phlebitis PULMONARY:   [ ]  productive cough   [ ]  asthma   [ ]  wheezing NEUROLOGIC:   [ ]  weakness  [ ]  paresthesias  [ ]  aphasia  [ ]  amaurosis  [ ]  dizziness HEMATOLOGIC:   [ ]  bleeding problems   [ ]  clotting disorders MUSCULOSKELETAL:  [ ]  joint pain   [ ]  joint swelling [ ]  leg swelling GASTROINTESTINAL: [ ]   blood in stool  [ ]   hematemesis GENITOURINARY:  [ ]   dysuria  [ ]   hematuria PSYCHIATRIC:  [ ]  history of major depression INTEGUMENTARY:  [ ]  rashes  [ ]  ulcers CONSTITUTIONAL:  [ ]  fever   [ ]  chills  PHYSICAL EXAM: Filed Vitals:   01/14/13 1051  BP: 113/62  Pulse: 80  Height: 6\' 1"  (1.854 m)  Weight: 250 lb 11.2 oz (113.717 kg)  SpO2: 96%   Body mass index is 33.08 kg/(m^2). GENERAL: The patient is a well-nourished male, in no acute distress. The vital signs are documented above. CARDIOVASCULAR: There is a regular rate and rhythm. He has palpable radial pulses bilaterally and palpable femoral pulses bilaterally. He has a palpable left dorsalis pedis pulse. I cannot palpate pedal pulses on the right. PULMONARY: There is good air exchange bilaterally without wheezing or rales. ABDOMEN: Soft and non-tender with normal pitched bowel sounds.  MUSCULOSKELETAL: There are no major deformities or cyanosis. NEUROLOGIC: No focal weakness or paresthesias are detected. SKIN:  There are no ulcers or rashes noted. PSYCHIATRIC: The patient has a normal affect.  DATA:  The only vein mapping in the confines 2011 at that time he had chronic clot in the cephalic vein on the right.  MEDICAL ISSUES: Given that he has had 24-year-old recent occlusions of his left upper arm graft and now has a covered stent across the venous anastomosis I do not think there were any further options for access in the  left arm. He had a failed basilic vein transposition which was his only option for a fistula on the left.  We could consider placing a right arm AV graft or a basilic vein transposition on the right if this was noted to be adequate at the time of surgery, however, on his most recent fistulogram he had some stenosis of the right brachiocephalic vein which underwent venoplasty. This reason, prior to proceeding with access in the right upper arm I think he needs a venogram to rule out a central venous stenosis on the right. If he has central venous stenosis on the right then I think he should be considered for a left thigh graft. If he does not have central venous stenosis on the right that he could be considered for a basilic vein transposition on the right versus an AV graft. This fistulogram has been scheduled for 01/26/2013. We will make further recommendations pending these results.  [place] [facility] [place] Beeper: [phone]

## 2013-01-26 ENCOUNTER — Encounter (HOSPITAL_COMMUNITY)
Admission: RE | Disposition: A | Discharge status: Home / Self Care | Payer: Self-pay | Source: Ambulatory Visit | Attending: Vascular Surgery

## 2013-01-26 ENCOUNTER — Ambulatory Visit (HOSPITAL_COMMUNITY)
Admission: RE | Admit: 2013-01-26 | Discharge: 2013-01-26 | Disposition: A | Discharge status: Home / Self Care | Payer: Medicare HMO | Source: Ambulatory Visit | Attending: Vascular Surgery | Admitting: Vascular Surgery

## 2013-01-26 DIAGNOSIS — I509 Heart failure, unspecified: Secondary | ICD-10-CM | POA: Insufficient documentation

## 2013-01-26 DIAGNOSIS — E785 Hyperlipidemia, unspecified: Secondary | ICD-10-CM | POA: Insufficient documentation

## 2013-01-26 DIAGNOSIS — Z888 Allergy status to other drugs, medicaments and biological substances status: Secondary | ICD-10-CM | POA: Insufficient documentation

## 2013-01-26 DIAGNOSIS — I2510 Atherosclerotic heart disease of native coronary artery without angina pectoris: Secondary | ICD-10-CM | POA: Insufficient documentation

## 2013-01-26 DIAGNOSIS — I2589 Other forms of chronic ischemic heart disease: Secondary | ICD-10-CM | POA: Insufficient documentation

## 2013-01-26 DIAGNOSIS — E0520 Thyrotoxicosis with toxic multinodular goiter without thyrotoxic crisis or storm: Secondary | ICD-10-CM | POA: Insufficient documentation

## 2013-01-26 DIAGNOSIS — N400 Enlarged prostate without lower urinary tract symptoms: Secondary | ICD-10-CM | POA: Insufficient documentation

## 2013-01-26 DIAGNOSIS — G4733 Obstructive sleep apnea (adult) (pediatric): Secondary | ICD-10-CM | POA: Insufficient documentation

## 2013-01-26 DIAGNOSIS — I82290 Acute embolism and thrombosis of other thoracic veins: Secondary | ICD-10-CM | POA: Insufficient documentation

## 2013-01-26 DIAGNOSIS — E119 Type 2 diabetes mellitus without complications: Secondary | ICD-10-CM | POA: Insufficient documentation

## 2013-01-26 DIAGNOSIS — Z87891 Personal history of nicotine dependence: Secondary | ICD-10-CM | POA: Insufficient documentation

## 2013-01-26 DIAGNOSIS — Z7982 Long term (current) use of aspirin: Secondary | ICD-10-CM | POA: Insufficient documentation

## 2013-01-26 DIAGNOSIS — Z79899 Other long term (current) drug therapy: Secondary | ICD-10-CM | POA: Insufficient documentation

## 2013-01-26 DIAGNOSIS — I5032 Chronic diastolic (congestive) heart failure: Secondary | ICD-10-CM | POA: Insufficient documentation

## 2013-01-26 DIAGNOSIS — N186 End stage renal disease: Secondary | ICD-10-CM

## 2013-01-26 DIAGNOSIS — I120 Hypertensive chronic kidney disease with stage 5 chronic kidney disease or end stage renal disease: Secondary | ICD-10-CM | POA: Insufficient documentation

## 2013-01-26 DIAGNOSIS — N2581 Secondary hyperparathyroidism of renal origin: Secondary | ICD-10-CM | POA: Insufficient documentation

## 2013-01-26 DIAGNOSIS — K219 Gastro-esophageal reflux disease without esophagitis: Secondary | ICD-10-CM | POA: Insufficient documentation

## 2013-01-26 DIAGNOSIS — Z881 Allergy status to other antibiotic agents status: Secondary | ICD-10-CM | POA: Insufficient documentation

## 2013-01-26 DIAGNOSIS — G609 Hereditary and idiopathic neuropathy, unspecified: Secondary | ICD-10-CM | POA: Insufficient documentation

## 2013-01-26 HISTORY — PX: VENOGRAM: SHX5497

## 2013-01-26 LAB — POCT I-STAT, CHEM 8
Calcium, Ion: 1.1 mmol/L — ABNORMAL LOW (ref 1.13–1.30)
Chloride: 105 mEq/L (ref 96–112)
HCT: 32 % — ABNORMAL LOW (ref 39.0–52.0)
Sodium: 141 mEq/L (ref 135–145)
TCO2: 27 mmol/L (ref 0–100)

## 2013-01-26 LAB — GLUCOSE, CAPILLARY: Glucose-Capillary: 111 mg/dL — ABNORMAL HIGH (ref 70–99)

## 2013-01-26 SURGERY — VENOGRAM
Anesthesia: LOCAL

## 2013-01-26 MED ORDER — SODIUM CHLORIDE 0.9 % IJ SOLN: 3.0000 mL | INTRAMUSCULAR | Status: DC | PRN

## 2013-01-26 NOTE — Op Note (Signed)
PATIENT: [name]   MRN: [id number] DOB: January 16, 1951    DATE OF PROCEDURE: 01/26/2013  INDICATIONS: [name] is a 62 y.o. male need for new hemodialysis access  PROCEDURE: right upper extremity central venogram  SURGEON: Judeth Cornfield. Scot Dock, MD, FACS  ANESTHESIA: local   EBL: minimal  TECHNIQUE: The patient was brought to the peripheral vascular lab after I IV was placed in the right hand. Contrast was injected through the IV and the hand and the upper arm veins were visualized. There was no visualization of the central veins and the chest. Her were collaterals around the shoulder suggesting occlusion. Next we placed an IV in the upper arm for better flow and again we were unable to demonstrate any flow block beyond the right subclavian vein. The patient appears to have central venous occlusion on the right.  FINDINGS:  Occlusion of the right brachiocephalic vein possibly obstructed by a right IJ catheter. The upper arm cephalic vein on the right is very small as is the basilic vein. There are collaterals around the shoulder suggesting chronic central vein occlusion on the right.  CLINICAL NOTE: I discussed placing a left thigh AV graft with the patient. He is very much opposed to that. The only other option I could see was to move his right IJ catheter to the left side and inject contrast through the right AJ catheter as this is being removed to see if there is any flow through the right brachiocephalic system. If there was potentially be considered for a right upper arm AV graft.  Deitra Mayo, MD, FACS [facility]  DATE OF DICTATION:   01/26/2013

## 2013-01-26 NOTE — H&P (View-Only) (Signed)
[facility]  Patient name: [name] MRN: [id number] DOB: Feb 25, 1951 Sex: male  REASON FOR VISIT: Evaluate for new hemodialysis access.  HPI: This is a pleasant 62 year old gentleman who currently is dialyzing with a right IJ tunneled dialysis catheter. His vascular access history as documented below.  07/09/2008: Placement of new right forearm AV graft 05/17/2010: Thrombectomy and revision of right arm AV graft [id number] first age basilic vein transposition in the left arm 10/09/2010 seconds days left basilic vein transposition 02/21/2011: venoplasty of right cephalic vein in the right brachiocephalic vein. 11/07/2011: Left upper arm AV graft. This clotted on 11/10/2012, to 2514, and 12/13/2012. Of note, on 11/26/2012, he had placement of a covered stent across the venous anastomosis.  He dialyzes on Tuesdays Thursdays and Saturdays. He denies any recent uremic symptoms. Specifically he denies nausea, vomiting, fatigue, anorexia, or palpitations.   Past Medical History  Diagnosis Date  . Hyperthyroidism   . GASTROENTERITIS, VIRAL 10/14/2009  . ONYCHOMYCOSIS, TOENAILS 12/26/2007  . GOITER, MULTINODULAR 12/26/2007  . HYPERTHYROIDISM 02/02/2010  . DIABETES MELLITUS, TYPE II 02/01/2010  . DYSLIPIDEMIA 06/18/2007  . GOUT 06/18/2007  . Hypocalcemia 06/07/2010  . DEMENTIA 09/02/2009  . DEPRESSION 10/14/2009  . PERIPHERAL NEUROPATHY 06/18/2007  . HYPERTENSION 06/18/2007  . CONGESTIVE HEART FAILURE 06/18/2007  . DIASTOLIC HEART FAILURE, CHRONIC 02/06/2009  . Unspecified hypotension 01/30/2010  . PULMONARY NODULE, RIGHT LOWER LOBE 06/08/2009  . GERD 06/18/2007  . ESRD 08/04/2010  . BENIGN PROSTATIC HYPERTROPHY 10/14/2009  . GYNECOMASTIA 07/17/2010  . NECK PAIN 07/31/2010  . FOOT PAIN 08/12/2008  . DIZZINESS 07/17/2010  . Other malaise and fatigue 11/24/2009  . GAIT DISTURBANCE 03/03/2010  . DYSPNEA 10/29/2008  . CHEST PAIN 03/29/2010  . TRANSAMINASES, SERUM, ELEVATED  02/01/2010  . COLONIC POLYPS, HX OF 10/14/2009  . Anemia 06/16/2011  . Ischemic cardiomyopathy 06/16/2011  . Hyperparathyroidism, secondary 06/16/2011  . OSA on CPAP 10/16/2011  . Hyperlipidemia 10/16/2011  . CAD, NATIVE VESSEL 02/06/2009    saw Dr. Missy Sabins last jan 2013  . Sleep apnea     cpap machine and o2    Family History  Problem Relation Age of Onset  . Heart disease Father   . Heart disease Sister     SOCIAL HISTORY: History  Substance Use Topics  . Smoking status: Former Smoker -- 1.00 packs/day for 25 years    Types: Cigarettes  . Smokeless tobacco: Never Used     Comment: Quit smoking 2007 Smoked x 25 years 1/2 ppd.  . Alcohol Use: Yes     Comment: occassional     Allergies  Allergen Reactions  . Cephalexin Other (See Comments)    Tongue swelling  . Statins     REACTION: perceived myalgias    Current Outpatient Prescriptions  Medication Sig Dispense Refill  . allopurinol (ZYLOPRIM) 100 MG tablet Take 100 mg by mouth daily.        [name] amitriptyline (ELAVIL) 50 MG tablet Take 50 mg by mouth daily as needed. As needed when feeling depressed.      [name] aspirin 81 MG EC tablet Take 81 mg by mouth daily.        . calcium-vitamin D (OSCAL WITH D) 500-200 MG-UNIT per tablet Take 3 tablets by mouth 3 (three) times daily.       . carvedilol (COREG) 25 MG tablet Take 25 mg by mouth 2 (two) times daily with a meal.      . cinacalcet (SENSIPAR) 60 MG tablet  Take 60 mg by mouth 2 (two) times daily.        . citalopram (CELEXA) 10 MG tablet Take 1 tablet (10 mg total) by mouth daily.  90 tablet  3  . clomiPHENE (CLOMID) 50 MG tablet Take 12.5 mg by mouth daily. 1/4 tab daily      . colesevelam (WELCHOL) 625 MG tablet Take 3 tablets (1,875 mg total) by mouth 2 (two) times daily with a meal.  540 tablet  3  . gabapentin (NEURONTIN) 300 MG capsule Take 1 capsule (300 mg total) by mouth at bedtime as needed. For pain  90 capsule  1  . lanthanum (FOSRENOL) 1000 MG chewable tablet Chew  1,000 mg by mouth 2 times daily at 12 noon and 4 pm.      . multivitamin (RENA-VIT) TABS tablet Take 1 tablet by mouth daily.        . Omega-3 Fatty Acids (FISH OIL) 1000 MG CAPS Take 1 capsule by mouth 2 (two) times daily.       [name] omeprazole (PRILOSEC) 20 MG capsule Take 20 mg by mouth daily.       No current facility-administered medications for this visit.    REVIEW OF SYSTEMS: Valu.Nieves ] denotes positive finding; [  ] denotes negative finding  CARDIOVASCULAR:  [ ]  chest pain   [ ]  chest pressure   [ ]  palpitations   [ ]  orthopnea   [ ]  dyspnea on exertion   [ ]  claudication   [ ]  rest pain   [ ]  DVT   [ ]  phlebitis PULMONARY:   [ ]  productive cough   [ ]  asthma   [ ]  wheezing NEUROLOGIC:   [ ]  weakness  [ ]  paresthesias  [ ]  aphasia  [ ]  amaurosis  [ ]  dizziness HEMATOLOGIC:   [ ]  bleeding problems   [ ]  clotting disorders MUSCULOSKELETAL:  [ ]  joint pain   [ ]  joint swelling [ ]  leg swelling GASTROINTESTINAL: [ ]   blood in stool  [ ]   hematemesis GENITOURINARY:  [ ]   dysuria  [ ]   hematuria PSYCHIATRIC:  [ ]  history of major depression INTEGUMENTARY:  [ ]  rashes  [ ]  ulcers CONSTITUTIONAL:  [ ]  fever   [ ]  chills  PHYSICAL EXAM: Filed Vitals:   01/14/13 1051  BP: 113/62  Pulse: 80  Height: 6\' 1"  (1.854 m)  Weight: 250 lb 11.2 oz (113.717 kg)  SpO2: 96%   Body mass index is 33.08 kg/(m^2). GENERAL: The patient is a well-nourished male, in no acute distress. The vital signs are documented above. CARDIOVASCULAR: There is a regular rate and rhythm. He has palpable radial pulses bilaterally and palpable femoral pulses bilaterally. He has a palpable left dorsalis pedis pulse. I cannot palpate pedal pulses on the right. PULMONARY: There is good air exchange bilaterally without wheezing or rales. ABDOMEN: Soft and non-tender with normal pitched bowel sounds.  MUSCULOSKELETAL: There are no major deformities or cyanosis. NEUROLOGIC: No focal weakness or paresthesias are detected. SKIN:  There are no ulcers or rashes noted. PSYCHIATRIC: The patient has a normal affect.  DATA:  The only vein mapping in the confines 2011 at that time he had chronic clot in the cephalic vein on the right.  MEDICAL ISSUES: Given that he has had 62-year-old recent occlusions of his left upper arm graft and now has a covered stent across the venous anastomosis I do not think there were any further options for access in the  left arm. He had a failed basilic vein transposition which was his only option for a fistula on the left.  We could consider placing a right arm AV graft or a basilic vein transposition on the right if this was noted to be adequate at the time of surgery, however, on his most recent fistulogram he had some stenosis of the right brachiocephalic vein which underwent venoplasty. This reason, prior to proceeding with access in the right upper arm I think he needs a venogram to rule out a central venous stenosis on the right. If he has central venous stenosis on the right then I think he should be considered for a left thigh graft. If he does not have central venous stenosis on the right that he could be considered for a basilic vein transposition on the right versus an AV graft. This fistulogram has been scheduled for 01/26/2013. We will make further recommendations pending these results.  [facility] [place] Beeper: [phone]

## 2013-01-26 NOTE — Interval H&P Note (Signed)
History and Physical Interval Note:  01/26/2013 10:36 AM  [name]  has presented today for surgery, with the diagnosis of instage renal  The various methods of treatment have been discussed with the patient and family. After consideration of risks, benefits and other options for treatment, the patient has consented to  Procedure(s): VENOGRAM (N/A) as a surgical intervention .  The patient's history has been reviewed, patient examined, no change in status, stable for surgery.  I have reviewed the patient's chart and labs.  Questions were answered to the patient's satisfaction.     Meagon Duskin S

## 2013-02-04 ENCOUNTER — Encounter: Payer: Self-pay | Admitting: *Deleted

## 2013-02-04 ENCOUNTER — Other Ambulatory Visit: Payer: Self-pay | Admitting: *Deleted

## 2013-02-09 ENCOUNTER — Encounter (HOSPITAL_COMMUNITY): Payer: Self-pay

## 2013-02-09 ENCOUNTER — Ambulatory Visit (HOSPITAL_COMMUNITY)
Admission: RE | Admit: 2013-02-09 | Discharge: 2013-02-09 | Disposition: A | Discharge status: Home / Self Care | Payer: Medicare HMO | Source: Ambulatory Visit | Attending: Internal Medicine | Admitting: Internal Medicine

## 2013-02-09 VITALS — BP 160/100 | HR 85 | Ht 73.0 in | Wt 253.8 lb

## 2013-02-09 DIAGNOSIS — Z7982 Long term (current) use of aspirin: Secondary | ICD-10-CM | POA: Insufficient documentation

## 2013-02-09 DIAGNOSIS — M109 Gout, unspecified: Secondary | ICD-10-CM | POA: Insufficient documentation

## 2013-02-09 DIAGNOSIS — I509 Heart failure, unspecified: Secondary | ICD-10-CM | POA: Insufficient documentation

## 2013-02-09 DIAGNOSIS — J4489 Other specified chronic obstructive pulmonary disease: Secondary | ICD-10-CM | POA: Insufficient documentation

## 2013-02-09 DIAGNOSIS — E669 Obesity, unspecified: Secondary | ICD-10-CM | POA: Insufficient documentation

## 2013-02-09 DIAGNOSIS — I5022 Chronic systolic (congestive) heart failure: Secondary | ICD-10-CM | POA: Insufficient documentation

## 2013-02-09 DIAGNOSIS — Z860100 Personal history of colon polyps, unspecified: Secondary | ICD-10-CM | POA: Insufficient documentation

## 2013-02-09 DIAGNOSIS — K219 Gastro-esophageal reflux disease without esophagitis: Secondary | ICD-10-CM | POA: Insufficient documentation

## 2013-02-09 DIAGNOSIS — E119 Type 2 diabetes mellitus without complications: Secondary | ICD-10-CM | POA: Insufficient documentation

## 2013-02-09 DIAGNOSIS — G4730 Sleep apnea, unspecified: Secondary | ICD-10-CM | POA: Insufficient documentation

## 2013-02-09 DIAGNOSIS — Z79899 Other long term (current) drug therapy: Secondary | ICD-10-CM | POA: Insufficient documentation

## 2013-02-09 DIAGNOSIS — I2510 Atherosclerotic heart disease of native coronary artery without angina pectoris: Secondary | ICD-10-CM | POA: Insufficient documentation

## 2013-02-09 DIAGNOSIS — E785 Hyperlipidemia, unspecified: Secondary | ICD-10-CM | POA: Insufficient documentation

## 2013-02-09 DIAGNOSIS — J449 Chronic obstructive pulmonary disease, unspecified: Secondary | ICD-10-CM | POA: Insufficient documentation

## 2013-02-09 DIAGNOSIS — I5032 Chronic diastolic (congestive) heart failure: Secondary | ICD-10-CM | POA: Insufficient documentation

## 2013-02-09 DIAGNOSIS — F0390 Unspecified dementia without behavioral disturbance: Secondary | ICD-10-CM | POA: Insufficient documentation

## 2013-02-09 DIAGNOSIS — N186 End stage renal disease: Secondary | ICD-10-CM | POA: Insufficient documentation

## 2013-02-09 DIAGNOSIS — R911 Solitary pulmonary nodule: Secondary | ICD-10-CM | POA: Insufficient documentation

## 2013-02-09 DIAGNOSIS — N2581 Secondary hyperparathyroidism of renal origin: Secondary | ICD-10-CM | POA: Insufficient documentation

## 2013-02-09 DIAGNOSIS — R5381 Other malaise: Secondary | ICD-10-CM | POA: Insufficient documentation

## 2013-02-09 DIAGNOSIS — I129 Hypertensive chronic kidney disease with stage 1 through stage 4 chronic kidney disease, or unspecified chronic kidney disease: Secondary | ICD-10-CM | POA: Insufficient documentation

## 2013-02-09 NOTE — Assessment & Plan Note (Signed)
BP elevated today because he has not had taken medications today. Will not adjust medications. Reinforced medication compliance.

## 2013-02-09 NOTE — Patient Instructions (Addendum)
We will schedule cardiac MRI  Follow up in 3 months  Do the following things EVERYDAY: 1) Weigh yourself in the morning before breakfast. Write it down and keep it in a log. 2) Take your medicines as prescribed 3) Eat low salt foods-Limit salt (sodium) to 2000 mg per day.  4) Stay as active as you can everyday 5) Limit all fluids for the day to less than 2 liters

## 2013-02-09 NOTE — Progress Notes (Signed)
Patient ID: [name], male   DOB: 06-23-1951, 62 y.o.   MRN: [id number] PCP: Dr [name] Nephrologist: Dr Mercy Moore  HPI: [name] is 62 year old male with PMH: obesity, DM, COPD on nighttime oxygen, sleep apnea on CPAP, ESRD- HD, CAD, S/P Taxus drug-eluting stent to the right coronary in September [id number], chronic systolic ic heart failure, ICM ECHO 12/12 EF 55% grade 2 diast dysfunction. Myoview had EF of 43% with inferior scar. no ischemia. Had [place] in 2/11 which showed low pressures (PCWP = 2) , PA 26/16 (22)  and normal cardiac output. Unable to tolerate statins so placed on Welchol  10/15/12 Nuclear Stress Test LV Ejection Fraction: 33%. LV Wall Motion: There is global hypokinesis. The LV is markedly enlarged.   Evaluated at [facility] for transplant but he was not felt to be candidate (11/2012) due to poor mobility, DM, and coronary disease.   He returns for follow up. Here for routine f/u. He continues on HD tues/thurs,sat. Dry weight 112.5 kg. Complains of fatigue going up steps. Denies SOB/PND/Orthopnea. Uses CPAP at night.  He has cut his carvedilol back to 12.5 mg twice a day on non dialysis days because he felt bad.  He did not take medications today.    ROS: All systems negative except as listed in HPI, PMH and Problem List.  Past Medical History  Diagnosis Date  . Hyperthyroidism   . GASTROENTERITIS, VIRAL 10/14/2009  . ONYCHOMYCOSIS, TOENAILS 12/26/2007  . GOITER, MULTINODULAR 12/26/2007  . HYPERTHYROIDISM 02/02/2010  . DIABETES MELLITUS, TYPE II 02/01/2010  . DYSLIPIDEMIA 06/18/2007  . GOUT 06/18/2007  . Hypocalcemia 06/07/2010  . DEMENTIA 09/02/2009  . DEPRESSION 10/14/2009  . PERIPHERAL NEUROPATHY 06/18/2007  . HYPERTENSION 06/18/2007  . CONGESTIVE HEART FAILURE 06/18/2007  . DIASTOLIC HEART FAILURE, CHRONIC 02/06/2009  . Unspecified hypotension 01/30/2010  . PULMONARY NODULE, RIGHT LOWER LOBE 06/08/2009  . GERD 06/18/2007  . ESRD 08/04/2010  . BENIGN PROSTATIC HYPERTROPHY 10/14/2009  .  GYNECOMASTIA 07/17/2010  . NECK PAIN 07/31/2010  . FOOT PAIN 08/12/2008  . DIZZINESS 07/17/2010  . Other malaise and fatigue 11/24/2009  . GAIT DISTURBANCE 03/03/2010  . DYSPNEA 10/29/2008  . CHEST PAIN 03/29/2010  . TRANSAMINASES, SERUM, ELEVATED 02/01/2010  . COLONIC POLYPS, HX OF 10/14/2009  . Anemia 06/16/2011  . Ischemic cardiomyopathy 06/16/2011  . Hyperparathyroidism, secondary 06/16/2011  . OSA on CPAP 10/16/2011  . Hyperlipidemia 10/16/2011  . CAD, NATIVE VESSEL 02/06/2009    saw Dr. Missy Sabins last jan 2013  . Sleep apnea     cpap machine and o2    Current Outpatient Prescriptions  Medication Sig Dispense Refill  . allopurinol (ZYLOPRIM) 100 MG tablet Take 100 mg by mouth daily.        [name] amitriptyline (ELAVIL) 50 MG tablet Take 50 mg by mouth daily as needed (rest).       [name] amLODipine (NORVASC) 10 MG tablet Take 10 mg by mouth daily.      [name] aspirin 81 MG EC tablet Take 81 mg by mouth daily.        . calcium-vitamin D (OSCAL WITH D) 500-200 MG-UNIT per tablet Take 3 tablets by mouth 3 (three) times daily with meals.       . carvedilol (COREG) 25 MG tablet Take 12.5 mg by mouth 2 (two) times daily with a meal.       . cinacalcet (SENSIPAR) 60 MG tablet Take 60 mg by mouth 2 (two) times daily.        . citalopram (  CELEXA) 10 MG tablet Take 1 tablet (10 mg total) by mouth daily.  90 tablet  3  . clomiPHENE (CLOMID) 50 MG tablet Take 12.5 mg by mouth daily. 1/4 tab daily      . colesevelam (WELCHOL) 625 MG tablet Take 3 tablets (1,875 mg total) by mouth 2 (two) times daily with a meal.  540 tablet  3  . gabapentin (NEURONTIN) 300 MG capsule Take 1 capsule (300 mg total) by mouth at bedtime as needed. For pain  90 capsule  1  . lanthanum (FOSRENOL) 1000 MG chewable tablet Chew 1,000 mg by mouth 2 times daily at 12 noon and 4 pm.      . multivitamin (RENA-VIT) TABS tablet Take 1 tablet by mouth daily.        . ranitidine (ZANTAC) 300 MG tablet Take 300 mg by mouth at bedtime.      . Omega-3  Fatty Acids (FISH OIL) 1000 MG CAPS Take 1 capsule by mouth daily.        No current facility-administered medications for this encounter.     PHYSICAL EXAM: Filed Vitals:   02/09/13 1012  BP: 160/100  Pulse: 85   General:  Well appearing. No resp difficulty HEENT: normal Neck: supple. JVP flat. Carotids 2+ bilaterally; no bruits. No lymphadenopathy or thryomegaly appreciated. Cor: PMI normal. Regular rate & rhythm. No rubs, gallops 2/6 SEM LUSB Lungs: clear Abdomen: obese  soft, nontender, nondistended. No hepatosplenomegaly. No bruits or masses. Good bowel sounds. Extremities: no cyanosis, clubbing, rash, edema. + grafts in both UEs  Neuro: alert & orientedx3, cranial nerves grossly intact. Moves all 4 extremities w/o difficulty. Affect pleasant.      ASSESSMENT & PLAN:

## 2013-02-09 NOTE — Assessment & Plan Note (Addendum)
Volume status stable with ongoing HD Tues, Thur, and Sat. After review nuclear stress test and ECHOs there is a discreptancy regarding EF being normal versus reduced. Discussed with Dr [name] will check cardiac MRI  to verify. Reinforced medication compliance. Follow up in 3 months.   Patient seen and examined with Darrick Grinder, NP. We discussed all aspects of the encounter. I agree with the assessment and plan as stated above.  He once again has a discrepancy in EF between echo and Myoview. This happened several years ago as well. Need more definitive assessment. Will order non-contrast cardiac MRI. Volume status stable with HD. Continue current regimen.

## 2013-02-10 MED ORDER — VANCOMYCIN HCL 10 G IV SOLR
1500.0000 mg | INTRAVENOUS | Status: DC
  Filled 2013-02-10: qty 1500

## 2013-02-10 NOTE — Addendum Note (Signed)
Encounter addended by: Scarlette Calico, RN on: 02/10/2013  3:25 PM<BR>     Documentation filed: Orders

## 2013-02-11 ENCOUNTER — Ambulatory Visit (HOSPITAL_COMMUNITY): Payer: Medicare HMO

## 2013-02-11 ENCOUNTER — Encounter (HOSPITAL_COMMUNITY): Payer: Self-pay

## 2013-02-11 ENCOUNTER — Ambulatory Visit (HOSPITAL_COMMUNITY): Payer: Medicare HMO | Admitting: Anesthesiology

## 2013-02-11 ENCOUNTER — Ambulatory Visit (HOSPITAL_COMMUNITY)
Admission: RE | Admit: 2013-02-11 | Discharge: 2013-02-11 | Disposition: A | Discharge status: Home / Self Care | Payer: Medicare HMO | Source: Ambulatory Visit | Attending: Vascular Surgery | Admitting: Vascular Surgery

## 2013-02-11 ENCOUNTER — Encounter (HOSPITAL_COMMUNITY): Payer: Self-pay | Admitting: Anesthesiology

## 2013-02-11 ENCOUNTER — Encounter (HOSPITAL_COMMUNITY)
Admission: RE | Disposition: A | Discharge status: Home / Self Care | Payer: Self-pay | Source: Ambulatory Visit | Attending: Vascular Surgery

## 2013-02-11 DIAGNOSIS — N400 Enlarged prostate without lower urinary tract symptoms: Secondary | ICD-10-CM | POA: Insufficient documentation

## 2013-02-11 DIAGNOSIS — K219 Gastro-esophageal reflux disease without esophagitis: Secondary | ICD-10-CM | POA: Insufficient documentation

## 2013-02-11 DIAGNOSIS — F3289 Other specified depressive episodes: Secondary | ICD-10-CM | POA: Insufficient documentation

## 2013-02-11 DIAGNOSIS — I2510 Atherosclerotic heart disease of native coronary artery without angina pectoris: Secondary | ICD-10-CM | POA: Insufficient documentation

## 2013-02-11 DIAGNOSIS — N186 End stage renal disease: Secondary | ICD-10-CM

## 2013-02-11 DIAGNOSIS — G5690 Unspecified mononeuropathy of unspecified upper limb: Secondary | ICD-10-CM | POA: Insufficient documentation

## 2013-02-11 DIAGNOSIS — Z452 Encounter for adjustment and management of vascular access device: Secondary | ICD-10-CM | POA: Insufficient documentation

## 2013-02-11 DIAGNOSIS — G4733 Obstructive sleep apnea (adult) (pediatric): Secondary | ICD-10-CM | POA: Insufficient documentation

## 2013-02-11 DIAGNOSIS — M109 Gout, unspecified: Secondary | ICD-10-CM | POA: Insufficient documentation

## 2013-02-11 DIAGNOSIS — I5032 Chronic diastolic (congestive) heart failure: Secondary | ICD-10-CM | POA: Insufficient documentation

## 2013-02-11 DIAGNOSIS — I120 Hypertensive chronic kidney disease with stage 5 chronic kidney disease or end stage renal disease: Secondary | ICD-10-CM | POA: Insufficient documentation

## 2013-02-11 DIAGNOSIS — I509 Heart failure, unspecified: Secondary | ICD-10-CM | POA: Insufficient documentation

## 2013-02-11 DIAGNOSIS — E785 Hyperlipidemia, unspecified: Secondary | ICD-10-CM | POA: Insufficient documentation

## 2013-02-11 DIAGNOSIS — F329 Major depressive disorder, single episode, unspecified: Secondary | ICD-10-CM | POA: Insufficient documentation

## 2013-02-11 DIAGNOSIS — Z992 Dependence on renal dialysis: Secondary | ICD-10-CM | POA: Insufficient documentation

## 2013-02-11 DIAGNOSIS — E119 Type 2 diabetes mellitus without complications: Secondary | ICD-10-CM | POA: Insufficient documentation

## 2013-02-11 HISTORY — PX: REMOVAL OF A DIALYSIS CATHETER: SHX6053

## 2013-02-11 HISTORY — PX: INSERTION OF DIALYSIS CATHETER: SHX1324

## 2013-02-11 LAB — POCT I-STAT 4, (NA,K, GLUC, HGB,HCT)
HCT: 35 % — ABNORMAL LOW (ref 39.0–52.0)
Hemoglobin: 11.9 g/dL — ABNORMAL LOW (ref 13.0–17.0)

## 2013-02-11 LAB — GLUCOSE, CAPILLARY: Glucose-Capillary: 98 mg/dL (ref 70–99)

## 2013-02-11 SURGERY — INSERTION OF DIALYSIS CATHETER
Anesthesia: General | Site: Chest | Laterality: Right | Wound class: Clean

## 2013-02-11 MED ORDER — LIDOCAINE-EPINEPHRINE (PF) 1 %-1:200000 IJ SOLN
INTRAMUSCULAR | Status: AC
  Filled 2013-02-11: qty 10

## 2013-02-11 MED ORDER — MUPIROCIN 2 % EX OINT
TOPICAL_OINTMENT | CUTANEOUS | Status: AC
  Administered 2013-02-11: 1 via NASAL
  Filled 2013-02-11: qty 22

## 2013-02-11 MED ORDER — VANCOMYCIN HCL 1000 MG IV SOLR
1000.0000 mg | Freq: Two times a day (BID) | INTRAVENOUS | Status: DC
  Administered 2013-02-11: 1000 mg via INTRAVENOUS
  Filled 2013-02-11 (×2): qty 1000

## 2013-02-11 MED ORDER — IOHEXOL 300 MG/ML  SOLN
INTRAMUSCULAR | Status: DC | PRN
  Administered 2013-02-11: 50 mL via INTRAVENOUS

## 2013-02-11 MED ORDER — FENTANYL CITRATE 0.05 MG/ML IJ SOLN
INTRAMUSCULAR | Status: DC | PRN
  Administered 2013-02-11: 100 ug via INTRAVENOUS

## 2013-02-11 MED ORDER — LIDOCAINE-EPINEPHRINE (PF) 1 %-1:200000 IJ SOLN
INTRAMUSCULAR | Status: DC | PRN
  Administered 2013-02-11: 17 mL

## 2013-02-11 MED ORDER — SODIUM CHLORIDE 0.9 % IV SOLN
INTRAVENOUS | Status: DC
Start: 2013-02-11 — End: 2013-02-11
  Administered 2013-02-11: 11:00:00 via INTRAVENOUS

## 2013-02-11 MED ORDER — OXYCODONE HCL 5 MG PO TABS: 5.0000 mg | ORAL_TABLET | ORAL | Status: DC | PRN

## 2013-02-11 MED ORDER — SODIUM CHLORIDE 0.9 % IV SOLN: INTRAVENOUS | Status: DC

## 2013-02-11 MED ORDER — PROPOFOL 10 MG/ML IV BOLUS
INTRAVENOUS | Status: DC | PRN
  Administered 2013-02-11: 200 mg via INTRAVENOUS

## 2013-02-11 MED ORDER — HEPARIN SODIUM (PORCINE) 1000 UNIT/ML IJ SOLN
INTRAMUSCULAR | Status: DC | PRN
  Administered 2013-02-11: 10 mL

## 2013-02-11 MED ORDER — SODIUM CHLORIDE 0.9 % IV SOLN
INTRAVENOUS | Status: DC | PRN
  Administered 2013-02-11 (×2): via INTRAVENOUS

## 2013-02-11 MED ORDER — SODIUM CHLORIDE 0.9 % IR SOLN
Status: DC | PRN
  Administered 2013-02-11: 12:00:00

## 2013-02-11 MED ORDER — 0.9 % SODIUM CHLORIDE (POUR BTL) OPTIME
TOPICAL | Status: DC | PRN
  Administered 2013-02-11: 1000 mL

## 2013-02-11 MED ORDER — MUPIROCIN 2 % EX OINT
TOPICAL_OINTMENT | Freq: Two times a day (BID) | CUTANEOUS | Status: DC
Start: 2013-02-11 — End: 2013-02-11

## 2013-02-11 MED ORDER — HEPARIN SODIUM (PORCINE) 1000 UNIT/ML IJ SOLN
INTRAMUSCULAR | Status: AC
  Filled 2013-02-11: qty 1

## 2013-02-11 MED ORDER — PHENYLEPHRINE HCL 10 MG/ML IJ SOLN
INTRAMUSCULAR | Status: DC | PRN
  Administered 2013-02-11 (×3): 80 ug via INTRAVENOUS

## 2013-02-11 MED ORDER — VANCOMYCIN HCL IN DEXTROSE 1-5 GM/200ML-% IV SOLN: 1000.0000 mg | Freq: Once | INTRAVENOUS | Status: DC

## 2013-02-11 SURGICAL SUPPLY — 54 items
ADH SKN CLS APL DERMABOND .7 (GAUZE/BANDAGES/DRESSINGS) ×1
BAG BANDED W/RUBBER/TAPE 36X54 (MISCELLANEOUS) ×3 IMPLANT
BAG DECANTER FOR FLEXI CONT (MISCELLANEOUS) ×3 IMPLANT
BAG EQP BAND 135X91 W/RBR TAPE (MISCELLANEOUS) ×1
BLADE SURG 11 STRL SS (BLADE) ×3 IMPLANT
CATH CANNON HEMO 15F 50CM (CATHETERS) IMPLANT
CATH CANNON HEMO 15FR 19 (HEMODIALYSIS SUPPLIES) IMPLANT
CATH CANNON HEMO 15FR 23CM (HEMODIALYSIS SUPPLIES) ×3 IMPLANT
CATH CANNON HEMO 15FR 31CM (HEMODIALYSIS SUPPLIES) IMPLANT
CATH CANNON HEMO 15FR 32CM (HEMODIALYSIS SUPPLIES) ×3 IMPLANT
CATH STRAIGHT 5FR 65CM (CATHETERS) ×3 IMPLANT
CLOTH BEACON ORANGE TIMEOUT ST (SAFETY) ×3 IMPLANT
COVER DOME SNAP 22 D (MISCELLANEOUS) ×3 IMPLANT
COVER PROBE W GEL 5X96 (DRAPES) ×3 IMPLANT
COVER SURGICAL LIGHT HANDLE (MISCELLANEOUS) ×3 IMPLANT
DERMABOND ADVANCED (GAUZE/BANDAGES/DRESSINGS) ×1
DERMABOND ADVANCED .7 DNX12 (GAUZE/BANDAGES/DRESSINGS) ×2 IMPLANT
DRAPE C-ARM 42X72 X-RAY (DRAPES) IMPLANT
DRAPE CHEST BREAST 15X10 FENES (DRAPES) ×3 IMPLANT
DRESSING OPSITE X SMALL 2X3 (GAUZE/BANDAGES/DRESSINGS) ×9 IMPLANT
GAUZE SPONGE 2X2 8PLY STRL LF (GAUZE/BANDAGES/DRESSINGS) ×2 IMPLANT
GAUZE SPONGE 4X4 16PLY XRAY LF (GAUZE/BANDAGES/DRESSINGS) ×3 IMPLANT
GLOVE BIO SURGEON STRL SZ7 (GLOVE) ×3 IMPLANT
GLOVE BIOGEL PI IND STRL 6.5 (GLOVE) ×2 IMPLANT
GLOVE BIOGEL PI IND STRL 7.5 (GLOVE) ×4 IMPLANT
GLOVE BIOGEL PI INDICATOR 6.5 (GLOVE) ×1
GLOVE BIOGEL PI INDICATOR 7.5 (GLOVE) ×2
GLOVE ECLIPSE 6.5 STRL STRAW (GLOVE) ×3 IMPLANT
GOWN STRL NON-REIN LRG LVL3 (GOWN DISPOSABLE) ×6 IMPLANT
KIT BASIN OR (CUSTOM PROCEDURE TRAY) ×3 IMPLANT
KIT ROOM TURNOVER OR (KITS) ×3 IMPLANT
NEEDLE 18GX1X1/2 (RX/OR ONLY) (NEEDLE) ×3 IMPLANT
NEEDLE HYPO 25GX1X1/2 BEV (NEEDLE) ×3 IMPLANT
NS IRRIG 1000ML POUR BTL (IV SOLUTION) ×3 IMPLANT
PACK SURGICAL SETUP 50X90 (CUSTOM PROCEDURE TRAY) ×3 IMPLANT
PAD ARMBOARD 7.5X6 YLW CONV (MISCELLANEOUS) ×6 IMPLANT
SET MICROPUNCTURE 5F STIFF (MISCELLANEOUS) ×3 IMPLANT
SHEATH AVANTI 11CM 5FR (MISCELLANEOUS) ×3 IMPLANT
SOAP 2 % CHG 4 OZ (WOUND CARE) ×3 IMPLANT
SPONGE GAUZE 2X2 STER 10/PKG (GAUZE/BANDAGES/DRESSINGS) ×1
STOPCOCK 4 WAY LG BORE MALE ST (IV SETS) ×3 IMPLANT
SUT ETHILON 3 0 PS 1 (SUTURE) ×6 IMPLANT
SUT MNCRL AB 4-0 PS2 18 (SUTURE) ×3 IMPLANT
SYR 20CC LL (SYRINGE) ×9 IMPLANT
SYR 30ML LL (SYRINGE) IMPLANT
SYR 3ML LL SCALE MARK (SYRINGE) ×3 IMPLANT
SYR 5ML LL (SYRINGE) ×3 IMPLANT
SYR CONTROL 10ML LL (SYRINGE) ×3 IMPLANT
SYRINGE 10CC LL (SYRINGE) ×6 IMPLANT
TAPE CLOTH SURG 4X10 WHT LF (GAUZE/BANDAGES/DRESSINGS) ×3 IMPLANT
TOWEL OR 17X24 6PK STRL BLUE (TOWEL DISPOSABLE) ×3 IMPLANT
TOWEL OR 17X26 10 PK STRL BLUE (TOWEL DISPOSABLE) ×3 IMPLANT
WATER STERILE IRR 1000ML POUR (IV SOLUTION) IMPLANT
WIRE AMPLATZ SS-J .035X180CM (WIRE) ×3 IMPLANT

## 2013-02-11 NOTE — Anesthesia Postprocedure Evaluation (Signed)
  Anesthesia Post-op Note  Patient: [name]  Procedure(s) Performed: Procedure(s) with comments: INSERTION OF DIALYSIS CATHETER (Left) - Ultrasound guided REMOVAL OF A DIALYSIS CATHETER (Right) RIGHT BRACHIOCEPHALIC VENOGRAM (Right)  Patient Location: PACU  Anesthesia Type:General  Level of Consciousness: awake  Airway and Oxygen Therapy: Patient Spontanous Breathing  Post-op Pain: mild  Post-op Assessment: Post-op Vital signs reviewed  Post-op Vital Signs: stable  Complications: No apparent anesthesia complications

## 2013-02-11 NOTE — Anesthesia Procedure Notes (Signed)
Procedure Name: LMA Insertion Date/Time: 02/11/2013 12:39 PM Performed by: Izora Gala Pre-anesthesia Checklist: Patient identified, Emergency Drugs available, Suction available and Patient being monitored Patient Re-evaluated:Patient Re-evaluated prior to inductionOxygen Delivery Method: Circle system utilized Preoxygenation: Pre-oxygenation with 100% oxygen Ventilation: Mask ventilation without difficulty LMA: LMA inserted LMA Size: 5.0 Number of attempts: 1 Tube secured with: Tape Dental Injury: Teeth and Oropharynx as per pre-operative assessment

## 2013-02-11 NOTE — Interval H&P Note (Signed)
[facility] [place]  History and Physical Update  The patient was interviewed and re-examined.  The patient's previous History and Physical has been reviewed and is unchanged from Dr. Nicole Cella consult on: 01/14/13.  There is no change in the plan of care: possible left internal jugular vein tunneled dialysis catheter placement, possible right central venogram via tunneled dialysis catheter, possible tunneled dialysis catheter removal.  Adele Barthel, MD [facility] [place] Office: [phone] Pager: [phone]  02/11/2013, 9:02 AM

## 2013-02-11 NOTE — Transfer of Care (Signed)
Immediate Anesthesia Transfer of Care Note  Patient: [name]  Procedure(s) Performed: Procedure(s) with comments: INSERTION OF DIALYSIS CATHETER (Left) - Ultrasound guided REMOVAL OF A DIALYSIS CATHETER (Right) RIGHT BRACHIOCEPHALIC VENOGRAM (Right)  Patient Location: PACU  Anesthesia Type:General  Level of Consciousness: awake and alert   Airway & Oxygen Therapy: Patient Spontanous Breathing and Patient connected to nasal cannula oxygen  Post-op Assessment: Report given to PACU RN, Post -op Vital signs reviewed and stable and Patient moving all extremities  Post vital signs: Reviewed and stable  Complications: No apparent anesthesia complications

## 2013-02-11 NOTE — Progress Notes (Signed)
Dr. Bridgett Larsson at bedside to review patient's cxr report. ---states okay to send patient home at this time without any further interventions.

## 2013-02-11 NOTE — Op Note (Signed)
OPERATIVE NOTE  PROCEDURE: 1. Left internal jugular vein vein tunneled dialysis catheter placement 2. Left internal jugular vein vein cannulation under ultrasound guidance 3. Left central venogram 4. Right central venogram via previous tunneled dialysis catheter  5. Runneled dialysis catheter removal  PRE-OPERATIVE DIAGNOSIS: end-stage renal failure  POST-OPERATIVE DIAGNOSIS: same as above  SURGEON: Zariah Cavendish LIANG-YU, MD  ANESTHESIA: general  ESTIMATED BLOOD LOSS: minimal  FINDING(S): 1.  Tips of the catheter in the right atrium on fluoroscopy 2.  No obvious pneumothorax on fluoroscopy 3.  Patent bilateral innominate veins 4.  Patent superior vena cava   SPECIMEN(S):  none  INDICATIONS:   [name] is a 62 y.o. male who presents with possible outflow obstruction due right internal jugular vein tunneled dialysis catheter.  Based on previous right arm venogram there might be an option for a right upper arm arteriovenous graft but the central venous system appearred obturated by the right internal jugular vein tunneled dialysis catheter.   The patient presents for left internal jugular vein tunneled dialysis catheter placement and right central venogram via prior tunneled dialysis catheter and removal of this tunneled dialysis catheter.  The patient is aware the risks of tunneled dialysis catheter placement include but are not limited to: bleeding, infection, central venous injury, pneumothorax, possible venous stenosis, possible malpositioning in the venous system, and possible infections related to long-term catheter presence. The patient was aware of these risks and agreed to proceed.  DESCRIPTION: After written full informed consent was obtained from the patient, the patient was taken back to the operating room.  Prior to induction, the patient was given IV antibiotics.  After obtaining adequate sedation, the patient was prepped and draped in the standard fashion for a chest or  neck tunneled dialysis catheter placement.  I anesthesized the neck cannulation site with local anesthetic.  Under ultrasound guidance, the left internal jugular vein vein was cannulated with the  Micropuncture needle.  A microwire was placed into the left internal jugular vein.  The needle was exchanged for a microsheath, which was connected to IV extension tubing.  A left central venogram was completed via this sheath under fluoroscopy.  This demonstrated a patent left innominate vein with patent superior vena cava.  At this point, I switch to the right side.  I made an incision over the tunneled dialysis catheter cuff and dissected it out.  I sharply lysed the cuff attachment and pulled back the tunneled dialysis catheter under fluoroscopy.  I aspirated one port to remove the concentrated heparin.  I did a right central venogram via the tunneled dialysis catheter.  This demonstrated the right innominate vein to be patent.  I pulled out the catheter and held pressure to the internal jugular vein.  After waiting 5 minutes, no further bleeding was noted.  I repaired the incision in the right chest with a running 4-0 Monocryl in a subcuticular fashion.  A sterile bandage was applied to the tunneled dialysis catheter exit site which will be allowed to drain as it is chronically colonized.    At this point, I returned to the left side.  A J-wire was placed through the sheath down into the inferior vena cava under fluroscopic guidance.  The wire was then secured in place with a clamp to the drapes.  The cannulation site, the catheter exit site, and tract for the subcutaneous tunnel were then anesthesized with a total of 20 cc of 1% Lidocaine with epinepherine.  I then made stab incisions at  the neck and exit sites.  I dissected from the chest to the neck and dilated the subcutaneous tunnel with a plastic dilator.  The wire was then unclamped and I removed the needle.  An end-hole catheter was loaded over the wire  and advanced into the superior vena cava.  The wire was then exchanged for an Amplatz super stiff wire.  The catheter was then removed.  Over the wire, the skin tract and venotomy was dilated serially with dilators.  Finally, the dilator-sheath was placed over the wire under fluroscopic guidance into the superior vena cava.  The dilator was removed.  A 27 cm Diatek catheter was woven over the wire and advanced under fluoroscopic guidance down into the right atrium.  The wire was then removed, and the sheath was broken and peeled away while holding the catheter cuff at the level of the skin.  The back end of this catheter was transected, revealing the two lumens of this catheter.  The ports were docked onto these two lumens.  The catheter hub was then screwed into place.  Each port was tested by aspirating and flushing.  No resistance was noted.  Each port was then thoroughly flushed with heparinized saline.  The catheter was secured in placed with two interrupted stitches of 3-0 Nylon tied to the catheter.  The neck incision was closed with a U-stitch of 4-0 Monocryl.  The neck and chest incision were cleaned and sterile bandages applied.  Each port was then loaded with concentrated heparin (1000 Units/mL) at the manufacturer recommended volumes to each port.  Sterile caps were applied to each port.  On completion fluoroscopy, the tips of the catheter were in the right atrium, and there was no evidence of pneumothorax.  COMPLICATIONS: none  CONDITION: stable   Adele Barthel, MD [facility] [place] Office: [phone] Pager: [phone]  02/11/2013, 1:41 PM

## 2013-02-11 NOTE — Anesthesia Preprocedure Evaluation (Addendum)
Anesthesia Evaluation  Patient identified by MRN, date of birth, ID band Patient awake    Reviewed: Allergy & Precautions, H&P , NPO status , Patient's Chart, lab work & pertinent test results  History of Anesthesia Complications Negative for: history of anesthetic complications  Airway Mallampati: II  Neck ROM: Full    Dental   Pulmonary shortness of breath, sleep apnea ,  breath sounds clear to auscultation        Cardiovascular hypertension, + CAD, + Past MI, + Cardiac Stents, +CHF and + DOE Rhythm:Regular Rate:Normal     Neuro/Psych  Neuromuscular disease    GI/Hepatic GERD-  ,  Endo/Other  diabetesHyperthyroidism Morbid obesity  Renal/GU ESRF and DialysisRenal disease     Musculoskeletal   Abdominal (+) + obese,   Peds  Hematology   Anesthesia Other Findings   Reproductive/Obstetrics                          Anesthesia Physical Anesthesia Plan  ASA: III  Anesthesia Plan: MAC   Post-op Pain Management:    Induction: Intravenous  Airway Management Planned: Natural Airway and Simple Face Mask  Additional Equipment:   Intra-op Plan:   Post-operative Plan: Extubation in OR  Informed Consent: I have reviewed the patients History and Physical, chart, labs and discussed the procedure including the risks, benefits and alternatives for the proposed anesthesia with the patient or authorized representative who has indicated his/her understanding and acceptance.   Dental advisory given  Plan Discussed with: CRNA and Surgeon  Anesthesia Plan Comments:         Anesthesia Quick Evaluation

## 2013-02-11 NOTE — Progress Notes (Signed)
All prior documentation done by Ileene Rubens RN under my sign in

## 2013-02-11 NOTE — H&P (View-Only) (Signed)
[facility]  Patient name: [name] MRN: [id number] DOB: 09-Apr-1951 Sex: male  REASON FOR VISIT: Evaluate for new hemodialysis access.  HPI: This is a pleasant 62 year old gentleman who currently is dialyzing with a right IJ tunneled dialysis catheter. His vascular access history as documented below.  07/09/2008: Placement of new right forearm AV graft 05/17/2010: Thrombectomy and revision of right arm AV graft [id number] first age basilic vein transposition in the left arm 10/09/2010 seconds days left basilic vein transposition 02/21/2011: venoplasty of right cephalic vein in the right brachiocephalic vein. 11/07/2011: Left upper arm AV graft. This clotted on 11/10/2012, to 2514, and 12/13/2012. Of note, on 11/26/2012, he had placement of a covered stent across the venous anastomosis.  He dialyzes on Tuesdays Thursdays and Saturdays. He denies any recent uremic symptoms. Specifically he denies nausea, vomiting, fatigue, anorexia, or palpitations.   Past Medical History  Diagnosis Date  . Hyperthyroidism   . GASTROENTERITIS, VIRAL 10/14/2009  . ONYCHOMYCOSIS, TOENAILS 12/26/2007  . GOITER, MULTINODULAR 12/26/2007  . HYPERTHYROIDISM 02/02/2010  . DIABETES MELLITUS, TYPE II 02/01/2010  . DYSLIPIDEMIA 06/18/2007  . GOUT 06/18/2007  . Hypocalcemia 06/07/2010  . DEMENTIA 09/02/2009  . DEPRESSION 10/14/2009  . PERIPHERAL NEUROPATHY 06/18/2007  . HYPERTENSION 06/18/2007  . CONGESTIVE HEART FAILURE 06/18/2007  . DIASTOLIC HEART FAILURE, CHRONIC 02/06/2009  . Unspecified hypotension 01/30/2010  . PULMONARY NODULE, RIGHT LOWER LOBE 06/08/2009  . GERD 06/18/2007  . ESRD 08/04/2010  . BENIGN PROSTATIC HYPERTROPHY 10/14/2009  . GYNECOMASTIA 07/17/2010  . NECK PAIN 07/31/2010  . FOOT PAIN 08/12/2008  . DIZZINESS 07/17/2010  . Other malaise and fatigue 11/24/2009  . GAIT DISTURBANCE 03/03/2010  . DYSPNEA 10/29/2008  . CHEST PAIN 03/29/2010  . TRANSAMINASES, SERUM, ELEVATED  02/01/2010  . COLONIC POLYPS, HX OF 10/14/2009  . Anemia 06/16/2011  . Ischemic cardiomyopathy 06/16/2011  . Hyperparathyroidism, secondary 06/16/2011  . OSA on CPAP 10/16/2011  . Hyperlipidemia 10/16/2011  . CAD, NATIVE VESSEL 02/06/2009    saw Dr. Missy Sabins last jan 2013  . Sleep apnea     cpap machine and o2    Family History  Problem Relation Age of Onset  . Heart disease Father   . Heart disease Sister     SOCIAL HISTORY: History  Substance Use Topics  . Smoking status: Former Smoker -- 1.00 packs/day for 25 years    Types: Cigarettes  . Smokeless tobacco: Never Used     Comment: Quit smoking 2007 Smoked x 25 years 1/2 ppd.  . Alcohol Use: Yes     Comment: occassional     Allergies  Allergen Reactions  . Cephalexin Other (See Comments)    Tongue swelling  . Statins     REACTION: perceived myalgias    Current Outpatient Prescriptions  Medication Sig Dispense Refill  . allopurinol (ZYLOPRIM) 100 MG tablet Take 100 mg by mouth daily.        [name] amitriptyline (ELAVIL) 50 MG tablet Take 50 mg by mouth daily as needed. As needed when feeling depressed.      [name] aspirin 81 MG EC tablet Take 81 mg by mouth daily.        . calcium-vitamin D (OSCAL WITH D) 500-200 MG-UNIT per tablet Take 3 tablets by mouth 3 (three) times daily.       . carvedilol (COREG) 25 MG tablet Take 25 mg by mouth 2 (two) times daily with a meal.      . cinacalcet (SENSIPAR) 60 MG tablet  Take 60 mg by mouth 2 (two) times daily.        . citalopram (CELEXA) 10 MG tablet Take 1 tablet (10 mg total) by mouth daily.  90 tablet  3  . clomiPHENE (CLOMID) 50 MG tablet Take 12.5 mg by mouth daily. 1/4 tab daily      . colesevelam (WELCHOL) 625 MG tablet Take 3 tablets (1,875 mg total) by mouth 2 (two) times daily with a meal.  540 tablet  3  . gabapentin (NEURONTIN) 300 MG capsule Take 1 capsule (300 mg total) by mouth at bedtime as needed. For pain  90 capsule  1  . lanthanum (FOSRENOL) 1000 MG chewable tablet Chew  1,000 mg by mouth 2 times daily at 12 noon and 4 pm.      . multivitamin (RENA-VIT) TABS tablet Take 1 tablet by mouth daily.        . Omega-3 Fatty Acids (FISH OIL) 1000 MG CAPS Take 1 capsule by mouth 2 (two) times daily.       [name] omeprazole (PRILOSEC) 20 MG capsule Take 20 mg by mouth daily.       No current facility-administered medications for this visit.    REVIEW OF SYSTEMS: Valu.Nieves ] denotes positive finding; [  ] denotes negative finding  CARDIOVASCULAR:  [ ]  chest pain   [ ]  chest pressure   [ ]  palpitations   [ ]  orthopnea   [ ]  dyspnea on exertion   [ ]  claudication   [ ]  rest pain   [ ]  DVT   [ ]  phlebitis PULMONARY:   [ ]  productive cough   [ ]  asthma   [ ]  wheezing NEUROLOGIC:   [ ]  weakness  [ ]  paresthesias  [ ]  aphasia  [ ]  amaurosis  [ ]  dizziness HEMATOLOGIC:   [ ]  bleeding problems   [ ]  clotting disorders MUSCULOSKELETAL:  [ ]  joint pain   [ ]  joint swelling [ ]  leg swelling GASTROINTESTINAL: [ ]   blood in stool  [ ]   hematemesis GENITOURINARY:  [ ]   dysuria  [ ]   hematuria PSYCHIATRIC:  [ ]  history of major depression INTEGUMENTARY:  [ ]  rashes  [ ]  ulcers CONSTITUTIONAL:  [ ]  fever   [ ]  chills  PHYSICAL EXAM: Filed Vitals:   01/14/13 1051  BP: 113/62  Pulse: 80  Height: 6\' 1"  (1.854 m)  Weight: 250 lb 11.2 oz (113.717 kg)  SpO2: 96%   Body mass index is 33.08 kg/(m^2). GENERAL: The patient is a well-nourished male, in no acute distress. The vital signs are documented above. CARDIOVASCULAR: There is a regular rate and rhythm. He has palpable radial pulses bilaterally and palpable femoral pulses bilaterally. He has a palpable left dorsalis pedis pulse. I cannot palpate pedal pulses on the right. PULMONARY: There is good air exchange bilaterally without wheezing or rales. ABDOMEN: Soft and non-tender with normal pitched bowel sounds.  MUSCULOSKELETAL: There are no major deformities or cyanosis. NEUROLOGIC: No focal weakness or paresthesias are detected. SKIN:  There are no ulcers or rashes noted. PSYCHIATRIC: The patient has a normal affect.  DATA:  The only vein mapping in the confines 2011 at that time he had chronic clot in the cephalic vein on the right.  MEDICAL ISSUES: Given that he has had 30-year-old recent occlusions of his left upper arm graft and now has a covered stent across the venous anastomosis I do not think there were any further options for access in the  left arm. He had a failed basilic vein transposition which was his only option for a fistula on the left.  We could consider placing a right arm AV graft or a basilic vein transposition on the right if this was noted to be adequate at the time of surgery, however, on his most recent fistulogram he had some stenosis of the right brachiocephalic vein which underwent venoplasty. This reason, prior to proceeding with access in the right upper arm I think he needs a venogram to rule out a central venous stenosis on the right. If he has central venous stenosis on the right then I think he should be considered for a left thigh graft. If he does not have central venous stenosis on the right that he could be considered for a basilic vein transposition on the right versus an AV graft. This fistulogram has been scheduled for 01/26/2013. We will make further recommendations pending these results.  [facility] Beeper: [phone]

## 2013-02-13 ENCOUNTER — Encounter (HOSPITAL_COMMUNITY): Payer: Self-pay | Admitting: Vascular Surgery

## 2013-02-13 ENCOUNTER — Encounter: Payer: Self-pay | Admitting: Internal Medicine

## 2013-02-19 ENCOUNTER — Ambulatory Visit (HOSPITAL_COMMUNITY)
Admission: RE | Admit: 2013-02-19 | Discharge: 2013-02-19 | Disposition: A | Discharge status: Home / Self Care | Payer: Medicare HMO | Source: Ambulatory Visit | Attending: Internal Medicine | Admitting: Internal Medicine

## 2013-02-19 ENCOUNTER — Ambulatory Visit (HOSPITAL_COMMUNITY): Admission: RE | Admit: 2013-02-19 | Payer: Medicare HMO | Source: Ambulatory Visit

## 2013-02-19 ENCOUNTER — Other Ambulatory Visit: Payer: Self-pay

## 2013-02-19 DIAGNOSIS — I2510 Atherosclerotic heart disease of native coronary artery without angina pectoris: Secondary | ICD-10-CM | POA: Insufficient documentation

## 2013-02-19 DIAGNOSIS — I517 Cardiomegaly: Secondary | ICD-10-CM | POA: Insufficient documentation

## 2013-02-19 DIAGNOSIS — E119 Type 2 diabetes mellitus without complications: Secondary | ICD-10-CM | POA: Insufficient documentation

## 2013-02-19 DIAGNOSIS — I120 Hypertensive chronic kidney disease with stage 5 chronic kidney disease or end stage renal disease: Secondary | ICD-10-CM | POA: Insufficient documentation

## 2013-02-19 DIAGNOSIS — I509 Heart failure, unspecified: Secondary | ICD-10-CM | POA: Insufficient documentation

## 2013-02-19 DIAGNOSIS — I5022 Chronic systolic (congestive) heart failure: Secondary | ICD-10-CM

## 2013-02-19 DIAGNOSIS — N186 End stage renal disease: Secondary | ICD-10-CM | POA: Insufficient documentation

## 2013-02-19 DIAGNOSIS — K219 Gastro-esophageal reflux disease without esophagitis: Secondary | ICD-10-CM | POA: Insufficient documentation

## 2013-02-19 DIAGNOSIS — I252 Old myocardial infarction: Secondary | ICD-10-CM | POA: Insufficient documentation

## 2013-02-19 DIAGNOSIS — I7789 Other specified disorders of arteries and arterioles: Secondary | ICD-10-CM | POA: Insufficient documentation

## 2013-02-24 ENCOUNTER — Encounter (HOSPITAL_COMMUNITY): Payer: Self-pay | Admitting: *Deleted

## 2013-02-24 ENCOUNTER — Encounter (HOSPITAL_COMMUNITY): Payer: Self-pay | Admitting: Pharmacy Technician

## 2013-02-24 NOTE — Progress Notes (Signed)
Cardiac mri 02/10/13,perfusion study 1/14, echo 1/12. ekg 5/14, cxr5/14

## 2013-02-26 MED ORDER — VANCOMYCIN HCL IN DEXTROSE 1-5 GM/200ML-% IV SOLN
1000.0000 mg | INTRAVENOUS | Status: AC
  Administered 2013-02-27: 1000 mg via INTRAVENOUS
  Filled 2013-02-26: qty 200

## 2013-02-27 ENCOUNTER — Ambulatory Visit (HOSPITAL_COMMUNITY)
Admission: RE | Admit: 2013-02-27 | Discharge: 2013-02-27 | Disposition: A | Discharge status: Home / Self Care | Payer: Medicare HMO | Source: Ambulatory Visit | Attending: Vascular Surgery | Admitting: Vascular Surgery

## 2013-02-27 ENCOUNTER — Encounter (HOSPITAL_COMMUNITY)
Admission: RE | Disposition: A | Discharge status: Home / Self Care | Payer: Self-pay | Source: Ambulatory Visit | Attending: Vascular Surgery

## 2013-02-27 ENCOUNTER — Ambulatory Visit (HOSPITAL_COMMUNITY): Payer: Medicare HMO | Admitting: Anesthesiology

## 2013-02-27 ENCOUNTER — Encounter (HOSPITAL_COMMUNITY): Payer: Self-pay | Admitting: Anesthesiology

## 2013-02-27 ENCOUNTER — Encounter (HOSPITAL_COMMUNITY): Payer: Self-pay | Admitting: *Deleted

## 2013-02-27 DIAGNOSIS — Z7982 Long term (current) use of aspirin: Secondary | ICD-10-CM | POA: Insufficient documentation

## 2013-02-27 DIAGNOSIS — Z881 Allergy status to other antibiotic agents status: Secondary | ICD-10-CM | POA: Insufficient documentation

## 2013-02-27 DIAGNOSIS — E785 Hyperlipidemia, unspecified: Secondary | ICD-10-CM | POA: Insufficient documentation

## 2013-02-27 DIAGNOSIS — E0590 Thyrotoxicosis, unspecified without thyrotoxic crisis or storm: Secondary | ICD-10-CM | POA: Insufficient documentation

## 2013-02-27 DIAGNOSIS — G4733 Obstructive sleep apnea (adult) (pediatric): Secondary | ICD-10-CM | POA: Insufficient documentation

## 2013-02-27 DIAGNOSIS — E119 Type 2 diabetes mellitus without complications: Secondary | ICD-10-CM | POA: Insufficient documentation

## 2013-02-27 DIAGNOSIS — F3289 Other specified depressive episodes: Secondary | ICD-10-CM | POA: Insufficient documentation

## 2013-02-27 DIAGNOSIS — N400 Enlarged prostate without lower urinary tract symptoms: Secondary | ICD-10-CM | POA: Insufficient documentation

## 2013-02-27 DIAGNOSIS — N186 End stage renal disease: Secondary | ICD-10-CM

## 2013-02-27 DIAGNOSIS — Z79899 Other long term (current) drug therapy: Secondary | ICD-10-CM | POA: Insufficient documentation

## 2013-02-27 DIAGNOSIS — F329 Major depressive disorder, single episode, unspecified: Secondary | ICD-10-CM | POA: Insufficient documentation

## 2013-02-27 DIAGNOSIS — Z888 Allergy status to other drugs, medicaments and biological substances status: Secondary | ICD-10-CM | POA: Insufficient documentation

## 2013-02-27 DIAGNOSIS — Z87891 Personal history of nicotine dependence: Secondary | ICD-10-CM | POA: Insufficient documentation

## 2013-02-27 DIAGNOSIS — K219 Gastro-esophageal reflux disease without esophagitis: Secondary | ICD-10-CM | POA: Insufficient documentation

## 2013-02-27 DIAGNOSIS — G609 Hereditary and idiopathic neuropathy, unspecified: Secondary | ICD-10-CM | POA: Insufficient documentation

## 2013-02-27 DIAGNOSIS — Z6833 Body mass index (BMI) 33.0-33.9, adult: Secondary | ICD-10-CM | POA: Insufficient documentation

## 2013-02-27 DIAGNOSIS — I2510 Atherosclerotic heart disease of native coronary artery without angina pectoris: Secondary | ICD-10-CM | POA: Insufficient documentation

## 2013-02-27 DIAGNOSIS — N2581 Secondary hyperparathyroidism of renal origin: Secondary | ICD-10-CM | POA: Insufficient documentation

## 2013-02-27 DIAGNOSIS — I509 Heart failure, unspecified: Secondary | ICD-10-CM | POA: Insufficient documentation

## 2013-02-27 DIAGNOSIS — I252 Old myocardial infarction: Secondary | ICD-10-CM | POA: Insufficient documentation

## 2013-02-27 DIAGNOSIS — I5032 Chronic diastolic (congestive) heart failure: Secondary | ICD-10-CM | POA: Insufficient documentation

## 2013-02-27 DIAGNOSIS — I120 Hypertensive chronic kidney disease with stage 5 chronic kidney disease or end stage renal disease: Secondary | ICD-10-CM | POA: Insufficient documentation

## 2013-02-27 DIAGNOSIS — E669 Obesity, unspecified: Secondary | ICD-10-CM | POA: Insufficient documentation

## 2013-02-27 DIAGNOSIS — Z9861 Coronary angioplasty status: Secondary | ICD-10-CM | POA: Insufficient documentation

## 2013-02-27 DIAGNOSIS — I2589 Other forms of chronic ischemic heart disease: Secondary | ICD-10-CM | POA: Insufficient documentation

## 2013-02-27 HISTORY — PX: BASCILIC VEIN TRANSPOSITION: SHX5742

## 2013-02-27 LAB — POCT I-STAT 4, (NA,K, GLUC, HGB,HCT)
Glucose, Bld: 159 mg/dL — ABNORMAL HIGH (ref 70–99)
Hemoglobin: 12.9 g/dL — ABNORMAL LOW (ref 13.0–17.0)
Potassium: 4.5 mEq/L (ref 3.5–5.1)

## 2013-02-27 SURGERY — TRANSPOSITION, VEIN, BASILIC
Anesthesia: General | Site: Arm Upper | Laterality: Right | Wound class: Clean

## 2013-02-27 MED ORDER — SODIUM CHLORIDE 0.9 % IR SOLN
Status: DC | PRN
  Administered 2013-02-27: 08:00:00

## 2013-02-27 MED ORDER — PROPOFOL 10 MG/ML IV BOLUS
INTRAVENOUS | Status: DC | PRN
  Administered 2013-02-27: 100 mg via INTRAVENOUS

## 2013-02-27 MED ORDER — LIDOCAINE HCL (CARDIAC) 20 MG/ML IV SOLN
INTRAVENOUS | Status: DC | PRN
  Administered 2013-02-27: 40 mg via INTRAVENOUS

## 2013-02-27 MED ORDER — SODIUM CHLORIDE 0.9 % IV SOLN
INTRAVENOUS | Status: DC
  Administered 2013-02-27 (×2): via INTRAVENOUS

## 2013-02-27 MED ORDER — MIDAZOLAM HCL 5 MG/5ML IJ SOLN
INTRAMUSCULAR | Status: DC | PRN
  Administered 2013-02-27 (×2): 1 mg via INTRAVENOUS

## 2013-02-27 MED ORDER — ONDANSETRON HCL 4 MG/2ML IJ SOLN
INTRAMUSCULAR | Status: DC | PRN
  Administered 2013-02-27: 4 mg via INTRAVENOUS

## 2013-02-27 MED ORDER — MIDAZOLAM HCL 2 MG/2ML IJ SOLN: 0.5000 mg | Freq: Once | INTRAMUSCULAR | Status: DC | PRN

## 2013-02-27 MED ORDER — EPHEDRINE SULFATE 50 MG/ML IJ SOLN
INTRAMUSCULAR | Status: DC | PRN
  Administered 2013-02-27 (×5): 10 mg via INTRAVENOUS

## 2013-02-27 MED ORDER — MEPERIDINE HCL 25 MG/ML IJ SOLN: 6.2500 mg | INTRAMUSCULAR | Status: DC | PRN

## 2013-02-27 MED ORDER — OXYCODONE HCL 5 MG/5ML PO SOLN: 5.0000 mg | Freq: Once | ORAL | Status: AC | PRN

## 2013-02-27 MED ORDER — OXYCODONE HCL 5 MG PO TABS
5.0000 mg | ORAL_TABLET | Freq: Once | ORAL | Status: AC | PRN
  Administered 2013-02-27: 5 mg via ORAL

## 2013-02-27 MED ORDER — PROMETHAZINE HCL 25 MG/ML IJ SOLN: 6.2500 mg | INTRAMUSCULAR | Status: DC | PRN

## 2013-02-27 MED ORDER — OXYCODONE HCL 5 MG PO TABS: 5.0000 mg | ORAL_TABLET | ORAL | Status: DC | PRN

## 2013-02-27 MED ORDER — PHENYLEPHRINE HCL 10 MG/ML IJ SOLN
INTRAMUSCULAR | Status: DC | PRN
  Administered 2013-02-27 (×2): 80 ug via INTRAVENOUS

## 2013-02-27 MED ORDER — FENTANYL CITRATE 0.05 MG/ML IJ SOLN: 25.0000 ug | INTRAMUSCULAR | Status: DC | PRN

## 2013-02-27 MED ORDER — FENTANYL CITRATE 0.05 MG/ML IJ SOLN
INTRAMUSCULAR | Status: DC | PRN
  Administered 2013-02-27: 25 ug via INTRAVENOUS
  Administered 2013-02-27: 50 ug via INTRAVENOUS
  Administered 2013-02-27: 25 ug via INTRAVENOUS
  Administered 2013-02-27: 100 ug via INTRAVENOUS

## 2013-02-27 MED ORDER — PHENYLEPHRINE HCL 10 MG/ML IJ SOLN
10.0000 mg | INTRAVENOUS | Status: DC | PRN
  Administered 2013-02-27: 30 ug/min via INTRAVENOUS

## 2013-02-27 MED ORDER — 0.9 % SODIUM CHLORIDE (POUR BTL) OPTIME
TOPICAL | Status: DC | PRN
  Administered 2013-02-27: 1000 mL

## 2013-02-27 SURGICAL SUPPLY — 45 items
CANISTER SUCTION 2500CC (MISCELLANEOUS) ×2 IMPLANT
CATH EMB 4FR 80CM (CATHETERS) ×2 IMPLANT
CLIP TI MEDIUM 24 (CLIP) ×2 IMPLANT
CLIP TI WIDE RED SMALL 24 (CLIP) ×2 IMPLANT
CLOTH BEACON ORANGE TIMEOUT ST (SAFETY) ×2 IMPLANT
COVER PROBE W GEL 5X96 (DRAPES) ×4 IMPLANT
COVER SURGICAL LIGHT HANDLE (MISCELLANEOUS) ×2 IMPLANT
DERMABOND ADHESIVE PROPEN (GAUZE/BANDAGES/DRESSINGS) ×3
DERMABOND ADVANCED (GAUZE/BANDAGES/DRESSINGS) ×1
DERMABOND ADVANCED .7 DNX12 (GAUZE/BANDAGES/DRESSINGS) ×1 IMPLANT
DERMABOND ADVANCED .7 DNX6 (GAUZE/BANDAGES/DRESSINGS) ×3 IMPLANT
ELECT REM PT RETURN 9FT ADLT (ELECTROSURGICAL) ×2
ELECTRODE REM PT RTRN 9FT ADLT (ELECTROSURGICAL) ×1 IMPLANT
GEL ULTRASOUND 20GR AQUASONIC (MISCELLANEOUS) IMPLANT
GLOVE BIO SURGEON STRL SZ7 (GLOVE) ×2 IMPLANT
GLOVE BIOGEL PI IND STRL 6.5 (GLOVE) ×1 IMPLANT
GLOVE BIOGEL PI IND STRL 7.0 (GLOVE) ×3 IMPLANT
GLOVE BIOGEL PI IND STRL 7.5 (GLOVE) ×2 IMPLANT
GLOVE BIOGEL PI INDICATOR 6.5 (GLOVE) ×1
GLOVE BIOGEL PI INDICATOR 7.0 (GLOVE) ×3
GLOVE BIOGEL PI INDICATOR 7.5 (GLOVE) ×2
GLOVE SS BIOGEL STRL SZ 6.5 (GLOVE) ×1 IMPLANT
GLOVE SS BIOGEL STRL SZ 7 (GLOVE) ×1 IMPLANT
GLOVE SUPERSENSE BIOGEL SZ 6.5 (GLOVE) ×1
GLOVE SUPERSENSE BIOGEL SZ 7 (GLOVE) ×1
GLOVE SURG SS PI 7.5 STRL IVOR (GLOVE) ×4 IMPLANT
GOWN PREVENTION PLUS XLARGE (GOWN DISPOSABLE) ×6 IMPLANT
GOWN STRL NON-REIN LRG LVL3 (GOWN DISPOSABLE) ×4 IMPLANT
KIT BASIN OR (CUSTOM PROCEDURE TRAY) ×2 IMPLANT
KIT ROOM TURNOVER OR (KITS) ×2 IMPLANT
NS IRRIG 1000ML POUR BTL (IV SOLUTION) ×2 IMPLANT
PACK CV ACCESS (CUSTOM PROCEDURE TRAY) ×2 IMPLANT
PAD ARMBOARD 7.5X6 YLW CONV (MISCELLANEOUS) ×4 IMPLANT
SPONGE GAUZE 4X4 12PLY (GAUZE/BANDAGES/DRESSINGS) ×2 IMPLANT
SPONGE LAP 18X18 X RAY DECT (DISPOSABLE) ×2 IMPLANT
SUT PROLENE 6 0 BV (SUTURE) ×4 IMPLANT
SUT PROLENE 6 0 CC (SUTURE) ×4 IMPLANT
SUT SILK 2 0 SH (SUTURE) ×2 IMPLANT
SUT VIC AB 3-0 SH 27 (SUTURE) ×4
SUT VIC AB 3-0 SH 27X BRD (SUTURE) ×4 IMPLANT
SUT VICRYL 4-0 PS2 18IN ABS (SUTURE) ×2 IMPLANT
TOWEL OR 17X24 6PK STRL BLUE (TOWEL DISPOSABLE) ×2 IMPLANT
TOWEL OR 17X26 10 PK STRL BLUE (TOWEL DISPOSABLE) ×2 IMPLANT
UNDERPAD 30X30 INCONTINENT (UNDERPADS AND DIAPERS) ×2 IMPLANT
WATER STERILE IRR 1000ML POUR (IV SOLUTION) ×2 IMPLANT

## 2013-02-27 NOTE — H&P (Signed)
Vascular Surgery H&P  Chief Complaint: Needs vascular access  HPI: [name] is a 62 y.o. male who presents for evaluation of need for vascular access. Patient has had previous AV graft in right forearm revised well up in the upper arm   Past Medical History  Diagnosis Date  . Hyperthyroidism   . GASTROENTERITIS, VIRAL 10/14/2009  . ONYCHOMYCOSIS, TOENAILS 12/26/2007  . GOITER, MULTINODULAR 12/26/2007  . HYPERTHYROIDISM 02/02/2010  . DIABETES MELLITUS, TYPE II 02/01/2010  . DYSLIPIDEMIA 06/18/2007  . GOUT 06/18/2007  . Hypocalcemia 06/07/2010  . DEMENTIA 09/02/2009  . DEPRESSION 10/14/2009  . PERIPHERAL NEUROPATHY 06/18/2007  . HYPERTENSION 06/18/2007  . CONGESTIVE HEART FAILURE 06/18/2007  . DIASTOLIC HEART FAILURE, CHRONIC 02/06/2009  . Unspecified hypotension 01/30/2010  . PULMONARY NODULE, RIGHT LOWER LOBE 06/08/2009  . GERD 06/18/2007  . ESRD 08/04/2010  . BENIGN PROSTATIC HYPERTROPHY 10/14/2009  . GYNECOMASTIA 07/17/2010  . NECK PAIN 07/31/2010  . FOOT PAIN 08/12/2008  . DIZZINESS 07/17/2010  . Other malaise and fatigue 11/24/2009  . GAIT DISTURBANCE 03/03/2010  . DYSPNEA 10/29/2008  . CHEST PAIN 03/29/2010  . TRANSAMINASES, SERUM, ELEVATED 02/01/2010  . COLONIC POLYPS, HX OF 10/14/2009  . Anemia 06/16/2011  . Ischemic cardiomyopathy 06/16/2011  . Hyperparathyroidism, secondary 06/16/2011  . OSA on CPAP 10/16/2011  . Hyperlipidemia 10/16/2011  . CAD, NATIVE VESSEL 02/06/2009    saw Dr. Missy Sabins last jan 2013  . Sleep apnea     cpap machine and o2   Past Surgical History  Procedure Laterality Date  . Stent 06/11/08    . Neck sugury  2/09  . Back surgury  1998  . Givens capsule study  09/29/2011    Procedure: GIVENS CAPSULE STUDY;  Surgeon: Missy Sabins, MD;  Location: [place];  Service: Endoscopy;  Laterality: Left;  . Esophagogastroduodenoscopy  09/28/2011    Procedure: ESOPHAGOGASTRODUODENOSCOPY (EGD);  Surgeon: Missy Sabins, MD;  Location: [facility] ENDOSCOPY;  Service: Endoscopy;   Laterality: N/A;  . Givens capsule study  09/30/2011    Procedure: GIVENS CAPSULE STUDY;  Surgeon: Jeryl Columbia, MD;  Location: [facility] ENDOSCOPY;  Service: Endoscopy;  Laterality: N/A;  . Cholecystectomy    . Cardiac catheterization    . Tonsillectomy    . Av fistula placement  11/07/2011    Procedure: INSERTION OF ARTERIOVENOUS (AV) GORE-TEX GRAFT ARM;  Surgeon: Tinnie Gens, MD;  Location: [place];  Service: Vascular;  Laterality: Left;  . Dialysis fistula creation  2009    right  . Insertion of dialysis catheter  2014    right side  . Insertion of dialysis catheter Left 02/11/2013    Procedure: INSERTION OF DIALYSIS CATHETER;  Surgeon: Conrad [place], MD;  Location: [place];  Service: Vascular;  Laterality: Left;  Ultrasound guided  . Removal of a dialysis catheter Right 02/11/2013    Procedure: REMOVAL OF A DIALYSIS CATHETER;  Surgeon: Conrad [place], MD;  Location: [place];  Service: Vascular;  Laterality: Right;   History   Social History  . Marital Status: Married    Spouse Name: N/A    Number of Children: N/A  . Years of Education: N/A   Occupational History  . disabled   . formerly Lawyer for [employer].    Social History Main Topics  . Smoking status: Former Smoker -- 1.00 packs/day for 25 years    Types: Cigarettes  . Smokeless tobacco: Never Used     Comment: Quit smoking 2007 Smoked x 25 years 1/2  ppd.  . Alcohol Use: Yes     Comment: occassional   . Drug Use: No  . Sexually Active: None   Other Topics Concern  . None   Social History Narrative  . None   Family History  Problem Relation Age of Onset  . Heart disease Father   . Heart disease Sister    Allergies  Allergen Reactions  . Cephalexin Other (See Comments)    Tongue swelling  . Statins     Weak muscles   Prior to Admission medications   Medication Sig Start Date End Date Taking? Authorizing Provider  allopurinol (ZYLOPRIM) 100 MG tablet Take 100 mg by mouth daily.    Yes Historical Provider, MD   amitriptyline (ELAVIL) 50 MG tablet Take 50 mg by mouth daily as needed (rest).  06/14/11  Yes Historical Provider, MD  amLODipine (NORVASC) 10 MG tablet Take 10 mg by mouth daily.   Yes Historical Provider, MD  aspirin 81 MG EC tablet Take 81 mg by mouth daily.    Yes Historical Provider, MD  calcium-vitamin D (OSCAL WITH D) 500-200 MG-UNIT per tablet Take 3 tablets by mouth 3 (three) times daily with meals.    Yes Historical Provider, MD  carvedilol (COREG) 25 MG tablet Take 25 mg by mouth 2 (two) times daily with a meal.    Yes Historical Provider, MD  cinacalcet (SENSIPAR) 60 MG tablet Take 60 mg by mouth 2 (two) times daily.    Yes Historical Provider, MD  citalopram (CELEXA) 10 MG tablet Take 1 tablet (10 mg total) by mouth daily. 01/07/13  Yes Biagio Borg, MD  clomiPHENE (CLOMID) 50 MG tablet Take 12.5 mg by mouth daily. 1/4 tab daily 07/04/12  Yes Renato Shin, MD  colesevelam [facility]) 625 MG tablet Take 3 tablets (1,875 mg total) by mouth 2 (two) times daily with a meal. 01/07/13  Yes Biagio Borg, MD  gabapentin (NEURONTIN) 300 MG capsule Take 1 capsule (300 mg total) by mouth at bedtime as needed. For pain 01/07/13  Yes Biagio Borg, MD  lanthanum (FOSRENOL) 1000 MG chewable tablet Chew 1,000 mg by mouth 2 times daily at 12 noon and 4 pm.   Yes Historical Provider, MD  multivitamin (RENA-VIT) TABS tablet Take 1 tablet by mouth daily.     Yes Historical Provider, MD  Omega-3 Fatty Acids (FISH OIL) 1000 MG CAPS Take 1 capsule by mouth daily as needed (for vitamin).    Yes Historical Provider, MD  ranitidine (ZANTAC) 300 MG tablet Take 300 mg by mouth at bedtime.   Yes Historical Provider, MD  oxyCODONE (ROXICODONE) 5 MG immediate release tablet Take 1 tablet (5 mg total) by mouth every 4 (four) hours as needed for pain. 02/11/13   Conrad [place], MD     Positive ROS: See history of present illness  All other systems have been reviewed and were otherwise negative with the exception of those  mentioned in the HPI and as above.  Physical Exam: Filed Vitals:   02/27/13 0629  BP: 112/64  Pulse: 74  Temp: 98.4 F (36.9 C)  Resp: 16    General: Alert, no acute distress HEENT: Normal for age Cardiovascular: Regular rate and rhythm. Carotid pulses 2+, no bruits audible Respiratory: Clear to auscultation. No cyanosis, no use of accessory musculature GI: No organomegaly, abdomen is soft and non-tender Skin: No lesions in the area of chief complaint Neurologic: Sensation intact distally Psychiatric: Patient is competent for consent with normal  mood and affect Musculoskeletal: No obvious deformities Extremities: Right upper extremity with 3+ brachial and radial pulse palpable. Thrombosed right forearm graft present. Incision almost to exhilarated from previous revisions. Will likely need right upper arm AV gra   Assessment/Plan:  Plan access right upper extremity-likely AV graft right upper arm-well ultrasound in the OR to see if any other options available   Tinnie Gens, MD 02/27/2013 7:30 AM    Vascular Surgery H&P  Chief Complaint: Needs vascular access  HPI: [name] is a 62 y.o. male who presents for evaluation of vascular access. Patient has had previous right arm graft in forearm which has been revised in the past into the upper arm near axilla.  Past Medical History  Diagnosis Date  . Hyperthyroidism   . GASTROENTERITIS, VIRAL 10/14/2009  . ONYCHOMYCOSIS, TOENAILS 12/26/2007  . GOITER, MULTINODULAR 12/26/2007  . HYPERTHYROIDISM 02/02/2010  . DIABETES MELLITUS, TYPE II 02/01/2010  . DYSLIPIDEMIA 06/18/2007  . GOUT 06/18/2007  . Hypocalcemia 06/07/2010  . DEMENTIA 09/02/2009  . DEPRESSION 10/14/2009  . PERIPHERAL NEUROPATHY 06/18/2007  . HYPERTENSION 06/18/2007  . CONGESTIVE HEART FAILURE 06/18/2007  . DIASTOLIC HEART FAILURE, CHRONIC 02/06/2009  . Unspecified hypotension 01/30/2010  . PULMONARY NODULE, RIGHT LOWER LOBE 06/08/2009  . GERD 06/18/2007  . ESRD  08/04/2010  . BENIGN PROSTATIC HYPERTROPHY 10/14/2009  . GYNECOMASTIA 07/17/2010  . NECK PAIN 07/31/2010  . FOOT PAIN 08/12/2008  . DIZZINESS 07/17/2010  . Other malaise and fatigue 11/24/2009  . GAIT DISTURBANCE 03/03/2010  . DYSPNEA 10/29/2008  . CHEST PAIN 03/29/2010  . TRANSAMINASES, SERUM, ELEVATED 02/01/2010  . COLONIC POLYPS, HX OF 10/14/2009  . Anemia 06/16/2011  . Ischemic cardiomyopathy 06/16/2011  . Hyperparathyroidism, secondary 06/16/2011  . OSA on CPAP 10/16/2011  . Hyperlipidemia 10/16/2011  . CAD, NATIVE VESSEL 02/06/2009    saw Dr. Missy Sabins last jan 2013  . Sleep apnea     cpap machine and o2   Past Surgical History  Procedure Laterality Date  . Stent 06/11/08    . Neck sugury  2/09  . Back surgury  1998  . Givens capsule study  09/29/2011    Procedure: GIVENS CAPSULE STUDY;  Surgeon: Missy Sabins, MD;  Location: [place];  Service: Endoscopy;  Laterality: Left;  . Esophagogastroduodenoscopy  09/28/2011    Procedure: ESOPHAGOGASTRODUODENOSCOPY (EGD);  Surgeon: Missy Sabins, MD;  Location: [facility] ENDOSCOPY;  Service: Endoscopy;  Laterality: N/A;  . Givens capsule study  09/30/2011    Procedure: GIVENS CAPSULE STUDY;  Surgeon: Jeryl Columbia, MD;  Location: [facility] ENDOSCOPY;  Service: Endoscopy;  Laterality: N/A;  . Cholecystectomy    . Cardiac catheterization    . Tonsillectomy    . Av fistula placement  11/07/2011    Procedure: INSERTION OF ARTERIOVENOUS (AV) GORE-TEX GRAFT ARM;  Surgeon: Tinnie Gens, MD;  Location: [place];  Service: Vascular;  Laterality: Left;  . Dialysis fistula creation  2009    right  . Insertion of dialysis catheter  2014    right side  . Insertion of dialysis catheter Left 02/11/2013    Procedure: INSERTION OF DIALYSIS CATHETER;  Surgeon: Conrad [place], MD;  Location: [place];  Service: Vascular;  Laterality: Left;  Ultrasound guided  . Removal of a dialysis catheter Right 02/11/2013    Procedure: REMOVAL OF A DIALYSIS CATHETER;  Surgeon: Conrad [place], MD;   Location: [place];  Service: Vascular;  Laterality: Right;   History   Social History  . Marital Status:  Married    Spouse Name: N/A    Number of Children: N/A  . Years of Education: N/A   Occupational History  . disabled   . formerly Lawyer for [employer].    Social History Main Topics  . Smoking status: Former Smoker -- 1.00 packs/day for 25 years    Types: Cigarettes  . Smokeless tobacco: Never Used     Comment: Quit smoking 2007 Smoked x 25 years 1/2 ppd.  . Alcohol Use: Yes     Comment: occassional   . Drug Use: No  . Sexually Active: None   Other Topics Concern  . None   Social History Narrative  . None   Family History  Problem Relation Age of Onset  . Heart disease Father   . Heart disease Sister    Allergies  Allergen Reactions  . Cephalexin Other (See Comments)    Tongue swelling  . Statins     Weak muscles   Prior to Admission medications   Medication Sig Start Date End Date Taking? Authorizing Provider  allopurinol (ZYLOPRIM) 100 MG tablet Take 100 mg by mouth daily.    Yes Historical Provider, MD  amitriptyline (ELAVIL) 50 MG tablet Take 50 mg by mouth daily as needed (rest).  06/14/11  Yes Historical Provider, MD  amLODipine (NORVASC) 10 MG tablet Take 10 mg by mouth daily.   Yes Historical Provider, MD  aspirin 81 MG EC tablet Take 81 mg by mouth daily.    Yes Historical Provider, MD  calcium-vitamin D (OSCAL WITH D) 500-200 MG-UNIT per tablet Take 3 tablets by mouth 3 (three) times daily with meals.    Yes Historical Provider, MD  carvedilol (COREG) 25 MG tablet Take 25 mg by mouth 2 (two) times daily with a meal.    Yes Historical Provider, MD  cinacalcet (SENSIPAR) 60 MG tablet Take 60 mg by mouth 2 (two) times daily.    Yes Historical Provider, MD  citalopram (CELEXA) 10 MG tablet Take 1 tablet (10 mg total) by mouth daily. 01/07/13  Yes Biagio Borg, MD  clomiPHENE (CLOMID) 50 MG tablet Take 12.5 mg by mouth daily. 1/4 tab daily 07/04/12   Yes Renato Shin, MD  colesevelam [facility]) 625 MG tablet Take 3 tablets (1,875 mg total) by mouth 2 (two) times daily with a meal. 01/07/13  Yes Biagio Borg, MD  gabapentin (NEURONTIN) 300 MG capsule Take 1 capsule (300 mg total) by mouth at bedtime as needed. For pain 01/07/13  Yes Biagio Borg, MD  lanthanum (FOSRENOL) 1000 MG chewable tablet Chew 1,000 mg by mouth 2 times daily at 12 noon and 4 pm.   Yes Historical Provider, MD  multivitamin (RENA-VIT) TABS tablet Take 1 tablet by mouth daily.     Yes Historical Provider, MD  Omega-3 Fatty Acids (FISH OIL) 1000 MG CAPS Take 1 capsule by mouth daily as needed (for vitamin).    Yes Historical Provider, MD  ranitidine (ZANTAC) 300 MG tablet Take 300 mg by mouth at bedtime.   Yes Historical Provider, MD  oxyCODONE (ROXICODONE) 5 MG immediate release tablet Take 1 tablet (5 mg total) by mouth every 4 (four) hours as needed for pain. 02/11/13   Conrad [place], MD     Positive ROS: See history of present illness  All other systems have been reviewed and were otherwise negative with the exception of those mentioned in the HPI and as above.  Physical Exam: Filed Vitals:   02/27/13 [id number]  BP: 112/64  Pulse: 74  Temp: 98.4 F (36.9 C)  Resp: 16    General: Alert, no acute distress HEENT: Normal for age Cardiovascular: Regular rate and rhythm. Carotid pulses 2+, no bruits audible Respiratory: Clear to auscultation. No cyanosis, no use of accessory musculature GI: No organomegaly, abdomen is soft and non-tender Skin: No lesions in the area of chief complaint Neurologic: Sensation intact distally Psychiatric: Patient is competent for consent with normal mood and affect Musculoskeletal: No obvious deformities Extremities: Right arm with 3+ brachial and radial pulse palpable. Old scar well up into the upper arm near axilla from revision of forearm graft. Currently has thrombosed forearm graft.      Assessment/Plan:  Plan access right upper  extremity likely AV graft right arm. We'll check for suitable veins for fistula in ano or with ultrasound   Tinnie Gens, MD 02/27/2013 7:31 AM  HPI: [name]   Past Medical History  Diagnosis Date  . Hyperthyroidism   . GASTROENTERITIS, VIRAL 10/14/2009  . ONYCHOMYCOSIS, TOENAILS 12/26/2007  . GOITER, MULTINODULAR 12/26/2007  . HYPERTHYROIDISM 02/02/2010  . DIABETES MELLITUS, TYPE II 02/01/2010  . DYSLIPIDEMIA 06/18/2007  . GOUT 06/18/2007  . Hypocalcemia 06/07/2010  . DEMENTIA 09/02/2009  . DEPRESSION 10/14/2009  . PERIPHERAL NEUROPATHY 06/18/2007  . HYPERTENSION 06/18/2007  . CONGESTIVE HEART FAILURE 06/18/2007  . DIASTOLIC HEART FAILURE, CHRONIC 02/06/2009  . Unspecified hypotension 01/30/2010  . PULMONARY NODULE, RIGHT LOWER LOBE 06/08/2009  . GERD 06/18/2007  . ESRD 08/04/2010  . BENIGN PROSTATIC HYPERTROPHY 10/14/2009  . GYNECOMASTIA 07/17/2010  . NECK PAIN 07/31/2010  . FOOT PAIN 08/12/2008  . DIZZINESS 07/17/2010  . Other malaise and fatigue 11/24/2009  . GAIT DISTURBANCE 03/03/2010  . DYSPNEA 10/29/2008  . CHEST PAIN 03/29/2010  . TRANSAMINASES, SERUM, ELEVATED 02/01/2010  . COLONIC POLYPS, HX OF 10/14/2009  . Anemia 06/16/2011  . Ischemic cardiomyopathy 06/16/2011  . Hyperparathyroidism, secondary 06/16/2011  . OSA on CPAP 10/16/2011  . Hyperlipidemia 10/16/2011  . CAD, NATIVE VESSEL 02/06/2009    saw Dr. Missy Sabins last jan 2013  . Sleep apnea     cpap machine and o2   Past Surgical History  Procedure Laterality Date  . Stent 06/11/08    . Neck sugury  2/09  . Back surgury  1998  . Givens capsule study  09/29/2011    Procedure: GIVENS CAPSULE STUDY;  Surgeon: Missy Sabins, MD;  Location: [place];  Service: Endoscopy;  Laterality: Left;  . Esophagogastroduodenoscopy  09/28/2011    Procedure: ESOPHAGOGASTRODUODENOSCOPY (EGD);  Surgeon: Missy Sabins, MD;  Location: [facility] ENDOSCOPY;  Service: Endoscopy;  Laterality: N/A;  . Givens capsule study  09/30/2011    Procedure: GIVENS  CAPSULE STUDY;  Surgeon: Jeryl Columbia, MD;  Location: [facility] ENDOSCOPY;  Service: Endoscopy;  Laterality: N/A;  . Cholecystectomy    . Cardiac catheterization    . Tonsillectomy    . Av fistula placement  11/07/2011    Procedure: INSERTION OF ARTERIOVENOUS (AV) GORE-TEX GRAFT ARM;  Surgeon: Tinnie Gens, MD;  Location: [place];  Service: Vascular;  Laterality: Left;  . Dialysis fistula creation  2009    right  . Insertion of dialysis catheter  2014    right side  . Insertion of dialysis catheter Left 02/11/2013    Procedure: INSERTION OF DIALYSIS CATHETER;  Surgeon: [name], MD;  Location: [place];  Service: Vascular;  Laterality: Left;  Ultrasound guided  . Removal of a dialysis catheter Right 02/11/2013  Procedure: REMOVAL OF A DIALYSIS CATHETER;  Surgeon: Conrad [place], MD;  Location: [place];  Service: Vascular;  Laterality: Right;   History   Social History  . Marital Status: Married    Spouse Name: N/A    Number of Children: N/A  . Years of Education: N/A   Occupational History  . disabled   . formerly Lawyer for [employer].    Social History Main Topics  . Smoking status: Former Smoker -- 1.00 packs/day for 25 years    Types: Cigarettes  . Smokeless tobacco: Never Used     Comment: Quit smoking 2007 Smoked x 25 years 1/2 ppd.  . Alcohol Use: Yes     Comment: occassional   . Drug Use: No  . Sexually Active: None   Other Topics Concern  . None   Social History Narrative  . None   Family History  Problem Relation Age of Onset  . Heart disease Father   . Heart disease Sister    Allergies  Allergen Reactions  . Cephalexin Other (See Comments)    Tongue swelling  . Statins     Weak muscles   Prior to Admission medications   Medication Sig Start Date End Date Taking? Authorizing Provider  allopurinol (ZYLOPRIM) 100 MG tablet Take 100 mg by mouth daily.    Yes Historical Provider, MD  amitriptyline (ELAVIL) 50 MG tablet Take 50 mg by mouth daily as needed  (rest).  06/14/11  Yes Historical Provider, MD  amLODipine (NORVASC) 10 MG tablet Take 10 mg by mouth daily.   Yes Historical Provider, MD  aspirin 81 MG EC tablet Take 81 mg by mouth daily.    Yes Historical Provider, MD  calcium-vitamin D (OSCAL WITH D) 500-200 MG-UNIT per tablet Take 3 tablets by mouth 3 (three) times daily with meals.    Yes Historical Provider, MD  carvedilol (COREG) 25 MG tablet Take 25 mg by mouth 2 (two) times daily with a meal.    Yes Historical Provider, MD  cinacalcet (SENSIPAR) 60 MG tablet Take 60 mg by mouth 2 (two) times daily.    Yes Historical Provider, MD  citalopram (CELEXA) 10 MG tablet Take 1 tablet (10 mg total) by mouth daily. 01/07/13  Yes Biagio Borg, MD  clomiPHENE (CLOMID) 50 MG tablet Take 12.5 mg by mouth daily. 1/4 tab daily 07/04/12  Yes Renato Shin, MD  colesevelam [facility]) 625 MG tablet Take 3 tablets (1,875 mg total) by mouth 2 (two) times daily with a meal. 01/07/13  Yes Biagio Borg, MD  gabapentin (NEURONTIN) 300 MG capsule Take 1 capsule (300 mg total) by mouth at bedtime as needed. For pain 01/07/13  Yes Biagio Borg, MD  lanthanum (FOSRENOL) 1000 MG chewable tablet Chew 1,000 mg by mouth 2 times daily at 12 noon and 4 pm.   Yes Historical Provider, MD  multivitamin (RENA-VIT) TABS tablet Take 1 tablet by mouth daily.     Yes Historical Provider, MD  Omega-3 Fatty Acids (FISH OIL) 1000 MG CAPS Take 1 capsule by mouth daily as needed (for vitamin).    Yes Historical Provider, MD  ranitidine (ZANTAC) 300 MG tablet Take 300 mg by mouth at bedtime.   Yes Historical Provider, MD  oxyCODONE (ROXICODONE) 5 MG immediate release tablet Take 1 tablet (5 mg total) by mouth every 4 (four) hours as needed for pain. 02/11/13   Conrad [place], MD     All other systems have been reviewed and  were otherwise negative with the exception of those mentioned in the HPI and as above.  Physical Exam: Filed Vitals:   02/27/13 0629  BP: 112/64  Pulse: 74  Temp: 98.4 F  (36.9 C)  Resp: 16    General: Alert, no acute distress HEENT: Normal for age Cardiovascular: Regular rate and rhythm. Carotid pulses 2+, no bruits audible Respiratory: Clear to auscultation. No cyanosis, no use of accessory musculature GI: No organomegaly, abdomen is soft and non-tender Skin: No lesions in the area of chief complaint Neurologic: Sensation intact distally Psychiatric: Patient is competent for consent with normal mood and affect Musculoskeletal: No obvious deformities Extremi     Tinnie Gens, MD 02/27/2013 7:32 AM

## 2013-02-27 NOTE — Transfer of Care (Signed)
Immediate Anesthesia Transfer of Care Note  Patient: [name]  Procedure(s) Performed: Procedure(s) with comments: [place] (Right) - Right Basilic Vein Transposition   Patient Location: PACU  Anesthesia Type:General  Level of Consciousness: awake, alert  and oriented  Airway & Oxygen Therapy: Patient Spontanous Breathing and Patient connected to face mask oxygen  Post-op Assessment: Report given to PACU RN  Post vital signs: Reviewed and stable  Complications: No apparent anesthesia complications

## 2013-02-27 NOTE — Anesthesia Postprocedure Evaluation (Signed)
  Anesthesia Post-op Note  Patient: [name]  Procedure(s) Performed: Procedure(s) with comments: [place] (Right) - Right Basilic Vein Transposition   Patient Location: PACU  Anesthesia Type:General  Level of Consciousness: awake, alert , oriented and patient cooperative  Airway and Oxygen Therapy: Patient Spontanous Breathing  Post-op Pain: none  Post-op Assessment: Post-op Vital signs reviewed, Patient's Cardiovascular Status Stable, Respiratory Function Stable, Patent Airway, No signs of Nausea or vomiting and Pain level controlled  Post-op Vital Signs: Reviewed and stable  Complications: No apparent anesthesia complications

## 2013-02-27 NOTE — Op Note (Signed)
OPERATIVE REPORT  Date of Surgery: 02/27/2013  Surgeon: Tinnie Gens, MD  Assistant: Johnette Abraham  Pre-op Diagnosis: End Stage Renal Disease  Post-op Diagnosis: End Stage Renal Disease  Procedure: Procedure(s): [place] upper extremity  Anesthesia: LMA  EBL: Minimal  Complications: None  Procedure Details: Patient was taken to the operating room placed in the supine position at which time satisfactory LMA anesthesia was administered. The right upper extremity was prepped with Betadine scrub and solution draped in routine sterile manner. Using the SonoSite ultrasound the basilic vein was imaged and didn't seem to be borderline but adequate down to the antecubital area. Patient had previous forearm AV graft with multiple revisions in a more anterior location of the basilic vein had been preserved. Therefore it was identified and proximal forearm and exposed from that area up to the axilla through multiple incisions along the medial aspect of the right arm. Branches were ligated with 4-0 silk ties and divided. It was transected after ligating it distally delivered into the axillary wound gently dilated with heparinized saline. It was satisfactory for the most part but did taper down distally. Incision was made in the distal upper arm over the brachial artery through previous incisions were Gore-Tex grafts have been revised. Gore-Tex was excised in order to expose the brachial artery. Brachial artery was exposed and circled with vessel loops. It was an excellent vessel. Tunnel was created in a curvilinear fashion on the anterior aspect of the arm and the vein was carefully delivered through the tunnel to avoid any kinking. Artery was occluded proximally and distally a 15 blade extended with Potts scissors. The vein was measured and slightly spatulated excising the smallest portion and anastomosed end-to-side brachial artery. When this was completed there was a pulse  in the fistula but no satisfactory Doppler flow was noted. We examined the tunnel and it did not appear the vein was kinked. It was felt that possibly the vein was too short and therefore it was a bit tight therefore the vein was taken down from the brachial artery anastomosis and the artery repaired using continuous 6-0 Prolene suture with a small rim of vein which was preserved tract as a patch. Vein was removed from the tunnel examined the Fogarty catheter was passed centrally through the vein. There was a tight area in the subclavian vein a more proximally at the Fogarty wouldn't traverse this area. Heparin saline flush under low resistance. The vein was then tunneled in a different area more superficial and with a smaller loop. Brachial artery was exposed slightly more proximally and Once again occluded proximally and distally opened with a 15 blade extended with the Potts scissors. The vein was delivered through the new tunnel spatulated and anastomosed end to side with 6-0 Prolene. Clamps were then released there was an excellent pulse and thrill in the graft on this fistula. Adequate hemostasis was achieved the wounds were all closed in layers with Vicryl in subcuticular fashion sterile dressing applied patient taken to recovery room in satisfactory condition  Tinnie Gens, MD 02/27/2013 10:49 AM

## 2013-02-27 NOTE — Preoperative (Signed)
Beta Blockers   Reason not to administer Beta Blockers:Not Applicable 

## 2013-02-27 NOTE — Anesthesia Preprocedure Evaluation (Addendum)
Anesthesia Evaluation  Patient identified by MRN, date of birth, ID band Patient awake    Reviewed: Allergy & Precautions, H&P , NPO status , Patient's Chart, lab work & pertinent test results, reviewed documented beta blocker date and time   History of Anesthesia Complications Negative for: history of anesthetic complications  Airway Mallampati: II TM Distance: >3 FB Neck ROM: Full    Dental  (+) Poor Dentition, Loose and Dental Advisory Given   Pulmonary shortness of breath and Long-Term Oxygen Therapy, sleep apnea, Continuous Positive Airway Pressure Ventilation and Oxygen sleep apnea , former smoker,  breath sounds clear to auscultation  Pulmonary exam normal       Cardiovascular hypertension, Pt. on medications and Pt. on home beta blockers + CAD, + Past MI, + Cardiac Stents and +CHF Rhythm:Regular Rate:Normal  2/14 stress test: EF 33%, no ischemia   Neuro/Psych PSYCHIATRIC DISORDERS Depression    GI/Hepatic Neg liver ROS, GERD-  ,  Endo/Other  diabetes (glu 159), Type 2Hyperthyroidism   Renal/GU ESRF and DialysisRenal disease (K+ 4.5)     Musculoskeletal   Abdominal (+) + obese,   Peds  Hematology   Anesthesia Other Findings   Reproductive/Obstetrics                          Anesthesia Physical Anesthesia Plan  ASA: III  Anesthesia Plan: General   Post-op Pain Management:    Induction: Intravenous  Airway Management Planned: LMA  Additional Equipment:   Intra-op Plan:   Post-operative Plan: Extubation in OR  Informed Consent: I have reviewed the patients History and Physical, chart, labs and discussed the procedure including the risks, benefits and alternatives for the proposed anesthesia with the patient or authorized representative who has indicated his/her understanding and acceptance.   Dental advisory given  Plan Discussed with: CRNA, Anesthesiologist and  Surgeon  Anesthesia Plan Comments: (Plan routine monitors, GA- LMA OK)       Anesthesia Quick Evaluation

## 2013-03-02 ENCOUNTER — Ambulatory Visit (HOSPITAL_COMMUNITY): Payer: Medicare HMO

## 2013-03-02 ENCOUNTER — Telehealth: Payer: Self-pay | Admitting: Vascular Surgery

## 2013-03-02 ENCOUNTER — Other Ambulatory Visit: Payer: Self-pay | Admitting: *Deleted

## 2013-03-02 ENCOUNTER — Encounter (HOSPITAL_COMMUNITY): Payer: Self-pay | Admitting: Vascular Surgery

## 2013-03-02 DIAGNOSIS — Z4931 Encounter for adequacy testing for hemodialysis: Secondary | ICD-10-CM

## 2013-03-02 DIAGNOSIS — N186 End stage renal disease: Secondary | ICD-10-CM

## 2013-03-02 NOTE — Telephone Encounter (Addendum)
Message copied by Gena Fray on Mon Mar 02, 2013 10:50 AM ------      Message from: Alfonso Patten      Created: Mon Mar 02, 2013  9:45 AM       Not fistulogram but duplex      ----- Message -----         From: Ulyses Amor, PA-C         Sent: 02/27/2013   9:25 AM           To: Alfonso Patten, RN            Bacillic vein transposition left upper arm.  F/U with Dr. Kellie Simmering in 6 weeks with fistulogram that day.  Thanks MC       ------  LM for pt @ home # regarding follow up info, dpm

## 2013-03-04 ENCOUNTER — Telehealth: Payer: Self-pay | Admitting: *Deleted

## 2013-03-04 NOTE — Telephone Encounter (Signed)
Mrs. Matzen called in to report that [name] was having some swelling of his RUE since his Right BVT on 03-02-13. This swelling has improved somewhat since yesterday and he has a small hematoma that also seems to be getting better. Patient is afebrile, wound is clean and dry, no erythema, hand is warm and he is having no problems with grasping objects. He has not been elevating his arm at all and Mrs. Kratovil will have him start elevating it. She will call us back if he does not continue to improve, starts having a fever, or has any hand numbness or coolness. He had HD today via Diatek Cath in his chest with no difficulty.  She voiced understanding and agreement of this plan.

## 2013-03-06 ENCOUNTER — Telehealth: Payer: Self-pay | Admitting: Vascular Surgery

## 2013-03-06 NOTE — Telephone Encounter (Signed)
Patients wife called to reschedule Mr [name] appointment on a non-dialysis day. However it is the first follow up post surgery with Dr Kellie Simmering.  Is it ok to schedule with another provider? I was under the impression that the first follow up had to be with the surgeon.  Thanks!

## 2013-03-06 NOTE — Telephone Encounter (Signed)
I believe it needs to be with Dr. Kellie Simmering per Maureen's discharge staff message. What time does he have HD? We could ask JDL when he gets back in town and then change the appt if he agrees.

## 2013-03-06 NOTE — Telephone Encounter (Signed)
We will ask Dr. Kellie Simmering about this on Thursday 03-12-13.

## 2013-04-01 ENCOUNTER — Ambulatory Visit (HOSPITAL_COMMUNITY)
Admission: RE | Admit: 2013-04-01 | Discharge: 2013-04-01 | Disposition: A | Discharge status: Home / Self Care | Payer: Medicare HMO | Source: Ambulatory Visit | Attending: Internal Medicine | Admitting: Internal Medicine

## 2013-04-01 ENCOUNTER — Encounter (HOSPITAL_COMMUNITY): Payer: Self-pay

## 2013-04-01 VITALS — BP 120/70 | HR 87 | Wt 254.1 lb

## 2013-04-01 DIAGNOSIS — F0390 Unspecified dementia without behavioral disturbance: Secondary | ICD-10-CM | POA: Insufficient documentation

## 2013-04-01 DIAGNOSIS — R0609 Other forms of dyspnea: Secondary | ICD-10-CM | POA: Insufficient documentation

## 2013-04-01 DIAGNOSIS — E119 Type 2 diabetes mellitus without complications: Secondary | ICD-10-CM | POA: Insufficient documentation

## 2013-04-01 DIAGNOSIS — M109 Gout, unspecified: Secondary | ICD-10-CM | POA: Insufficient documentation

## 2013-04-01 DIAGNOSIS — Z860100 Personal history of colon polyps, unspecified: Secondary | ICD-10-CM | POA: Insufficient documentation

## 2013-04-01 DIAGNOSIS — J4489 Other specified chronic obstructive pulmonary disease: Secondary | ICD-10-CM | POA: Insufficient documentation

## 2013-04-01 DIAGNOSIS — G4733 Obstructive sleep apnea (adult) (pediatric): Secondary | ICD-10-CM | POA: Insufficient documentation

## 2013-04-01 DIAGNOSIS — R0989 Other specified symptoms and signs involving the circulatory and respiratory systems: Secondary | ICD-10-CM | POA: Insufficient documentation

## 2013-04-01 DIAGNOSIS — E785 Hyperlipidemia, unspecified: Secondary | ICD-10-CM | POA: Insufficient documentation

## 2013-04-01 DIAGNOSIS — N186 End stage renal disease: Secondary | ICD-10-CM | POA: Insufficient documentation

## 2013-04-01 DIAGNOSIS — G609 Hereditary and idiopathic neuropathy, unspecified: Secondary | ICD-10-CM | POA: Insufficient documentation

## 2013-04-01 DIAGNOSIS — I5042 Chronic combined systolic (congestive) and diastolic (congestive) heart failure: Secondary | ICD-10-CM | POA: Insufficient documentation

## 2013-04-01 DIAGNOSIS — Z992 Dependence on renal dialysis: Secondary | ICD-10-CM | POA: Insufficient documentation

## 2013-04-01 DIAGNOSIS — Z79899 Other long term (current) drug therapy: Secondary | ICD-10-CM | POA: Insufficient documentation

## 2013-04-01 DIAGNOSIS — J449 Chronic obstructive pulmonary disease, unspecified: Secondary | ICD-10-CM | POA: Insufficient documentation

## 2013-04-01 DIAGNOSIS — I5022 Chronic systolic (congestive) heart failure: Secondary | ICD-10-CM

## 2013-04-01 DIAGNOSIS — I2589 Other forms of chronic ischemic heart disease: Secondary | ICD-10-CM | POA: Insufficient documentation

## 2013-04-01 DIAGNOSIS — I2510 Atherosclerotic heart disease of native coronary artery without angina pectoris: Secondary | ICD-10-CM | POA: Insufficient documentation

## 2013-04-01 NOTE — Assessment & Plan Note (Signed)
Cardiac MRI reviewed. EF 37%. LV dilated with no scar. I suspect this is nonischemic CM. He currently has class III symptoms. Volume status looks good with HD however. Unable to titrate meds more due to low BP with dialysis. We discussed options of 1) continued medical management. 2) CPX testing or 3) R and LHC. For now we will proceed with CPX testing to get a better understanding of his exercise capacity and whether this is primarily cardiac or not.

## 2013-04-01 NOTE — Patient Instructions (Addendum)
Your physician has recommended that you have a cardiopulmonary stress test (CPX). CPX testing is a non-invasive measurement of heart and lung function. It replaces a traditional treadmill stress test. This type of test provides a tremendous amount of information that relates not only to your present condition but also for future outcomes. This test combines measurements of you ventilation, respiratory gas exchange in the lungs, electrocardiogram (EKG), blood pressure and physical response before, during, and following an exercise protocol.  We will contact you in 3 months to schedule your next appointment.  

## 2013-04-01 NOTE — Addendum Note (Signed)
Encounter addended by: Scarlette Calico, RN on: 04/01/2013  2:36 PM<BR>     Documentation filed: Patient Instructions Section

## 2013-04-01 NOTE — Progress Notes (Signed)
Patient ID: [name], male   DOB: 01/01/1951, 62 y.o.   MRN: [id number] PCP: Dr [name] Nephrologist: Dr Mercy Moore  HPI: [name] is 62 year old male with PMH: obesity, DM, COPD on nighttime oxygen, sleep apnea on CPAP, ESRD- HD, CAD, S/P Taxus drug-eluting stent to the right coronary in September [id number], chronic systolic/diastolic heart failure ECHO 12/12 EF 55% grade 2, KL (Unable to tolerate statins so placed on Welchol)  Had RHC in 2/11 which showed low pressures (PCWP = 2) , PA 26/16 (22)  and normal cardiac output.  Echo 12/12: EF 55% Myoview 10/1512: EF 33%. LV Wall Motion: There is global hypokinesis. The LV is markedly enlarged. Small fixed apical defect.  cMRI 02/19/13: EF 37% dilated LV. diffuse HK  Evaluated at [facility] for kidney transplant but he was not felt to be candidate (11/2012) due to poor mobility, DM, and coronary disease.   Continues to have trouble with HD access clotting. Now using L chest perm cath. Able to maintain dry weight 112.5 kg. Continues to have significant DOE. Says he has to stop after 100 yards. This has been fairly chronic.  No CP/edema/PND/Orthopnea. Uses CPAP at night. Since last visit had cMRI to confirm EF and it was 37%. BP was dropping on HD days and am carvedilol held on those days. Still feels wiped out with morning meds.    Past Medical History  Diagnosis Date  . Hyperthyroidism   . GASTROENTERITIS, VIRAL 10/14/2009  . ONYCHOMYCOSIS, TOENAILS 12/26/2007  . GOITER, MULTINODULAR 12/26/2007  . HYPERTHYROIDISM 02/02/2010  . DIABETES MELLITUS, TYPE II 02/01/2010  . DYSLIPIDEMIA 06/18/2007  . GOUT 06/18/2007  . Hypocalcemia 06/07/2010  . DEMENTIA 09/02/2009  . DEPRESSION 10/14/2009  . PERIPHERAL NEUROPATHY 06/18/2007  . HYPERTENSION 06/18/2007  . CONGESTIVE HEART FAILURE 06/18/2007  . DIASTOLIC HEART FAILURE, CHRONIC 02/06/2009  . Unspecified hypotension 01/30/2010  . PULMONARY NODULE, RIGHT LOWER LOBE 06/08/2009  . GERD 06/18/2007  . ESRD 08/04/2010  . BENIGN  PROSTATIC HYPERTROPHY 10/14/2009  . GYNECOMASTIA 07/17/2010  . NECK PAIN 07/31/2010  . FOOT PAIN 08/12/2008  . DIZZINESS 07/17/2010  . Other malaise and fatigue 11/24/2009  . GAIT DISTURBANCE 03/03/2010  . DYSPNEA 10/29/2008  . CHEST PAIN 03/29/2010  . TRANSAMINASES, SERUM, ELEVATED 02/01/2010  . COLONIC POLYPS, HX OF 10/14/2009  . Anemia 06/16/2011  . Ischemic cardiomyopathy 06/16/2011  . Hyperparathyroidism, secondary 06/16/2011  . OSA on CPAP 10/16/2011  . Hyperlipidemia 10/16/2011  . CAD, NATIVE VESSEL 02/06/2009    saw Dr. Missy Sabins last jan 2013  . Sleep apnea     cpap machine and o2    Current Outpatient Prescriptions  Medication Sig Dispense Refill  . allopurinol (ZYLOPRIM) 100 MG tablet Take 100 mg by mouth daily.       [name] amitriptyline (ELAVIL) 50 MG tablet Take 50 mg by mouth daily as needed (rest).       [name] amLODipine (NORVASC) 10 MG tablet Take 10 mg by mouth daily.      [name] aspirin 81 MG EC tablet Take 81 mg by mouth daily.       . calcium-vitamin D (OSCAL WITH D) 500-200 MG-UNIT per tablet Take 3 tablets by mouth 3 (three) times daily with meals.       . carvedilol (COREG) 25 MG tablet Take 25 mg by mouth 2 (two) times daily with a meal.       . cinacalcet (SENSIPAR) 60 MG tablet Take 60 mg by mouth 2 (two) times daily.       [name]  citalopram (CELEXA) 10 MG tablet Take 1 tablet (10 mg total) by mouth daily.  90 tablet  3  . clomiPHENE (CLOMID) 50 MG tablet Take 12.5 mg by mouth daily. 1/4 tab daily      . colesevelam (WELCHOL) 625 MG tablet Take 3 tablets (1,875 mg total) by mouth 2 (two) times daily with a meal.  540 tablet  3  . gabapentin (NEURONTIN) 300 MG capsule Take 1 capsule (300 mg total) by mouth at bedtime as needed. For pain  90 capsule  1  . lanthanum (FOSRENOL) 1000 MG chewable tablet Chew 1,000 mg by mouth 2 times daily at 12 noon and 4 pm.      . multivitamin (RENA-VIT) TABS tablet Take 1 tablet by mouth daily.        . Omega-3 Fatty Acids (FISH OIL) 1000 MG CAPS Take 1  capsule by mouth daily as needed (for vitamin).       [name] oxyCODONE (ROXICODONE) 5 MG immediate release tablet Take 1 tablet (5 mg total) by mouth every 4 (four) hours as needed for pain.  30 tablet  0  . ranitidine (ZANTAC) 300 MG tablet Take 300 mg by mouth at bedtime.       No current facility-administered medications for this encounter.     PHYSICAL EXAM: Filed Vitals:   04/01/13 1338  BP: 120/70  Pulse: 87   General:  Well appearing. No resp difficulty HEENT: normal Neck: supple. JVP flat. Carotids 2+ bilaterally; no bruits. No lymphadenopathy or thryomegaly appreciated. Cor: PMI normal. Regular rate & rhythm. No rubs, gallops 2/6 SEM LUSB. L chest perm-cath Lungs: clear Abdomen: obese  soft, nontender, nondistended. No hepatosplenomegaly. No bruits or masses. Good bowel sounds. Extremities: no cyanosis, clubbing, rash, edema. + grafts in both UEs  Neuro: alert & orientedx3, cranial nerves grossly intact. Moves all 4 extremities w/o difficulty. Affect pleasant.   ASSESSMENT & PLAN:

## 2013-04-01 NOTE — Addendum Note (Signed)
Encounter addended by: Scarlette Calico, RN on: 04/01/2013  2:34 PM<BR>     Documentation filed: Orders

## 2013-04-02 ENCOUNTER — Encounter: Payer: Self-pay | Admitting: Vascular Surgery

## 2013-04-06 ENCOUNTER — Encounter (INDEPENDENT_AMBULATORY_CARE_PROVIDER_SITE_OTHER): Payer: Medicare HMO | Admitting: *Deleted

## 2013-04-06 ENCOUNTER — Ambulatory Visit (INDEPENDENT_AMBULATORY_CARE_PROVIDER_SITE_OTHER): Payer: Medicare HMO | Admitting: Vascular Surgery

## 2013-04-06 ENCOUNTER — Encounter: Payer: Self-pay | Admitting: Vascular Surgery

## 2013-04-06 VITALS — BP 152/71 | HR 87 | Resp 20 | Ht 73.0 in | Wt 251.0 lb

## 2013-04-06 DIAGNOSIS — N186 End stage renal disease: Secondary | ICD-10-CM

## 2013-04-06 DIAGNOSIS — Z4931 Encounter for adequacy testing for hemodialysis: Secondary | ICD-10-CM

## 2013-04-06 DIAGNOSIS — Z992 Dependence on renal dialysis: Secondary | ICD-10-CM

## 2013-04-06 NOTE — Progress Notes (Signed)
Subjective:     Patient ID: [name], male   DOB: [birth year], 62 y.o.   MRN: [id number]  HPI this 62 year old male returns for followup regarding his basilic vein transposition the right upper extremity which I performed 02/27/2013. The fistula has not been utilized being only 6 weeks postop. He occasionally notices some tingling in the right hand no rest pain or total numbness.   Review of Systems     Objective:   Physical Exam BP 152/71  Pulse 87  Resp 20  Ht 6\' 1"  (1.854 m)  Wt 251 lb (113.853 kg)  BMI 33.12 kg/m2  General well-developed well-nourished male in no apparent stress alert and oriented x3 Right upper extremity-excellent pulse and palpable thrill in the basilic vein transposition. Length was limited because of available basilic vein for the procedure. 3+ radial pulse palpable.   Today I ordered a duplex exam of the right basilic vein transposition. He has excellent flow. There is no evidence of steal syndrome distally with blood pressure equal with fistula compressed and open. There is one area of increased velocity in the very proximal upper arm with possibly some thickening of the vein.     Assessment:     nicely functioning the right basilic vein transposition-created 6 weeks ago    Plan:     okay to use the basilic vein transposition in late August 2014 Return to see me on when necessary basis

## 2013-04-13 ENCOUNTER — Ambulatory Visit: Payer: Medicare HMO | Admitting: Surgery

## 2013-04-14 ENCOUNTER — Ambulatory Visit: Payer: Medicare HMO | Admitting: Vascular Surgery

## 2013-04-27 ENCOUNTER — Telehealth (HOSPITAL_COMMUNITY): Payer: Self-pay | Admitting: *Deleted

## 2013-04-27 NOTE — Telephone Encounter (Signed)
Per pt t/c - states over the weekend his wt went up 8 lbs and he had to take metolazone to have it return to his baseline.  Pt recently had blood work as noted in [facility] that demonstrated an elevated BUN and Crea and decreased potassium level.  He reports his PCP MD decreased his Torsemide to 20 mg 2 tablets twice a day.  He had been taking 3 tablets twice a day.  Since this decrease - his weight has been elevated but is now back to normal.  He reports the PCP did not adjusted the potassium chloride.  He reports that he took extra and is having repeat blood work soon. Pt is concerned that with him being on the lower dose of Torsemide that his weight is going to continually increase and he will have to be taking metolazone more and more frequently.  Advised to continue to watch his weight and keep appointments as scheduled.  He will call back with further weight gain.

## 2013-04-27 NOTE — Telephone Encounter (Signed)
Agreed. May need to increase torsemide back up but we will follow closely and see how he does.

## 2013-04-29 ENCOUNTER — Ambulatory Visit (HOSPITAL_COMMUNITY): Payer: Medicare HMO | Attending: Internal Medicine

## 2013-05-15 ENCOUNTER — Ambulatory Visit (INDEPENDENT_AMBULATORY_CARE_PROVIDER_SITE_OTHER): Payer: Medicare HMO | Admitting: Endocrinology

## 2013-05-15 ENCOUNTER — Encounter: Payer: Self-pay | Admitting: Endocrinology

## 2013-05-15 VITALS — BP 134/80 | HR 68 | Ht 73.0 in | Wt 248.0 lb

## 2013-05-15 DIAGNOSIS — E291 Testicular hypofunction: Secondary | ICD-10-CM

## 2013-05-15 DIAGNOSIS — E042 Nontoxic multinodular goiter: Secondary | ICD-10-CM

## 2013-05-15 DIAGNOSIS — R209 Unspecified disturbances of skin sensation: Secondary | ICD-10-CM

## 2013-05-15 DIAGNOSIS — E119 Type 2 diabetes mellitus without complications: Secondary | ICD-10-CM

## 2013-05-15 DIAGNOSIS — N2581 Secondary hyperparathyroidism of renal origin: Secondary | ICD-10-CM

## 2013-05-15 DIAGNOSIS — R200 Anesthesia of skin: Secondary | ICD-10-CM

## 2013-05-15 MED ORDER — CLOMIPHENE CITRATE 50 MG PO TABS: 12.5000 mg | ORAL_TABLET | Freq: Every day | ORAL | Status: DC

## 2013-05-15 MED ORDER — GLUCOSE BLOOD VI STRP: 1.0000 | ORAL_STRIP | Freq: Every day | Status: DC

## 2013-05-15 NOTE — Patient Instructions (Addendum)
i have sent a prescription to your pharmacy, to refill the "clomiphine" (to raise the testosterone level).   blood tests are being requested for you today.  We'll contact you with results. Please come back for a follow-up appointment in 3 months. if ever you have fever while taking methimazole, stop it and call us, because of the risk of a rare side-effect.

## 2013-05-15 NOTE — Progress Notes (Signed)
Subjective:    Patient ID: [name], male    DOB: 1951/01/31, 62 y.o.   MRN: [id number]  HPI The state of at least three ongoing medical problems is addressed today: Pt returns for f/u of type 2 DM (dx'ed 2003; he has not required any medication; complicated by peripheral sensory neuropathy and CAD).  He denies weight loss.  no cbg record, but states cbg's are well-controlled Hyperthyroidism: he has not recently required tapazole.  He has fatigue. Hypogonadism was noted at dialysis.  He has generalized muscle weakness.  He says ED sxs persist, but he ran out of clomid 1 month ago.  Past Medical History  Diagnosis Date  . Hyperthyroidism   . GASTROENTERITIS, VIRAL 10/14/2009  . ONYCHOMYCOSIS, TOENAILS 12/26/2007  . GOITER, MULTINODULAR 12/26/2007  . HYPERTHYROIDISM 02/02/2010  . DIABETES MELLITUS, TYPE II 02/01/2010  . DYSLIPIDEMIA 06/18/2007  . GOUT 06/18/2007  . Hypocalcemia 06/07/2010  . DEMENTIA 09/02/2009  . DEPRESSION 10/14/2009  . PERIPHERAL NEUROPATHY 06/18/2007  . HYPERTENSION 06/18/2007  . CONGESTIVE HEART FAILURE 06/18/2007  . DIASTOLIC HEART FAILURE, CHRONIC 02/06/2009  . Unspecified hypotension 01/30/2010  . PULMONARY NODULE, RIGHT LOWER LOBE 06/08/2009  . GERD 06/18/2007  . ESRD 08/04/2010  . BENIGN PROSTATIC HYPERTROPHY 10/14/2009  . GYNECOMASTIA 07/17/2010  . NECK PAIN 07/31/2010  . FOOT PAIN 08/12/2008  . DIZZINESS 07/17/2010  . Other malaise and fatigue 11/24/2009  . GAIT DISTURBANCE 03/03/2010  . DYSPNEA 10/29/2008  . CHEST PAIN 03/29/2010  . TRANSAMINASES, SERUM, ELEVATED 02/01/2010  . COLONIC POLYPS, HX OF 10/14/2009  . Anemia 06/16/2011  . Ischemic cardiomyopathy 06/16/2011  . Hyperparathyroidism, secondary 06/16/2011  . OSA on CPAP 10/16/2011  . Hyperlipidemia 10/16/2011  . CAD, NATIVE VESSEL 02/06/2009    saw Dr. Missy Sabins last jan 2013  . Sleep apnea     cpap machine and o2    Past Surgical History  Procedure Laterality Date  . Stent 06/11/08    . Neck sugury  2/09  . Back  surgury  1998  . Givens capsule study  09/29/2011    Procedure: GIVENS CAPSULE STUDY;  Surgeon: Missy Sabins, MD;  Location: [place];  Service: Endoscopy;  Laterality: Left;  . Esophagogastroduodenoscopy  09/28/2011    Procedure: ESOPHAGOGASTRODUODENOSCOPY (EGD);  Surgeon: Missy Sabins, MD;  Location: [facility] ENDOSCOPY;  Service: Endoscopy;  Laterality: N/A;  . Givens capsule study  09/30/2011    Procedure: GIVENS CAPSULE STUDY;  Surgeon: Jeryl Columbia, MD;  Location: [facility] ENDOSCOPY;  Service: Endoscopy;  Laterality: N/A;  . Cholecystectomy    . Cardiac catheterization    . Tonsillectomy    . Av fistula placement  11/07/2011    Procedure: INSERTION OF ARTERIOVENOUS (AV) GORE-TEX GRAFT ARM;  Surgeon: Tinnie Gens, MD;  Location: [place];  Service: Vascular;  Laterality: Left;  . Dialysis fistula creation  2009    right  . Insertion of dialysis catheter  2014    right side  . Insertion of dialysis catheter Left 02/11/2013    Procedure: INSERTION OF DIALYSIS CATHETER;  Surgeon: Conrad Tripp, MD;  Location: [place];  Service: Vascular;  Laterality: Left;  Ultrasound guided  . Removal of a dialysis catheter Right 02/11/2013    Procedure: REMOVAL OF A DIALYSIS CATHETER;  Surgeon: Conrad Rosita, MD;  Location: [place];  Service: Vascular;  Laterality: Right;  . Bascilic vein transposition Right 02/27/2013    Procedure: BASCILIC VEIN TRANSPOSITION;  Surgeon: Mal Misty, MD;  Location: [place];  Service: Vascular;  Laterality: Right;  Right Basilic Vein Transposition     History   Social History  . Marital Status: Married    Spouse Name: N/A    Number of Children: N/A  . Years of Education: N/A   Occupational History  . disabled   . formerly Lawyer for [employer].    Social History Main Topics  . Smoking status: Former Smoker -- 1.00 packs/day for 25 years    Types: Cigarettes  . Smokeless tobacco: Never Used     Comment: Quit smoking 2007 Smoked x 25 years 1/2 ppd.  . Alcohol Use: Yes      Comment: occassional   . Drug Use: No  . Sexual Activity: Not on file   Other Topics Concern  . Not on file   Social History Narrative  . No narrative on file    Current Outpatient Prescriptions on File Prior to Visit  Medication Sig Dispense Refill  . allopurinol (ZYLOPRIM) 100 MG tablet Take 100 mg by mouth daily.       [name] amitriptyline (ELAVIL) 50 MG tablet Take 50 mg by mouth daily as needed (rest).       [name] amLODipine (NORVASC) 10 MG tablet Take 10 mg by mouth daily.      [name] aspirin 81 MG EC tablet Take 81 mg by mouth daily.       . calcium-vitamin D (OSCAL WITH D) 500-200 MG-UNIT per tablet Take 3 tablets by mouth 3 (three) times daily with meals.       . carvedilol (COREG) 25 MG tablet Take 25 mg by mouth 2 (two) times daily with a meal.       . cinacalcet (SENSIPAR) 60 MG tablet Take 60 mg by mouth 2 (two) times daily.       . citalopram (CELEXA) 10 MG tablet Take 1 tablet (10 mg total) by mouth daily.  90 tablet  3  . colesevelam (WELCHOL) 625 MG tablet Take 3 tablets (1,875 mg total) by mouth 2 (two) times daily with a meal.  540 tablet  3  . gabapentin (NEURONTIN) 300 MG capsule Take 1 capsule (300 mg total) by mouth at bedtime as needed. For pain  90 capsule  1  . lanthanum (FOSRENOL) 1000 MG chewable tablet Chew 1,000 mg by mouth 2 times daily at 12 noon and 4 pm.      . multivitamin (RENA-VIT) TABS tablet Take 1 tablet by mouth daily.        . Omega-3 Fatty Acids (FISH OIL) 1000 MG CAPS Take 1 capsule by mouth daily as needed (for vitamin).       [name] oxyCODONE (ROXICODONE) 5 MG immediate release tablet Take 1 tablet (5 mg total) by mouth every 4 (four) hours as needed for pain.  30 tablet  0  . ranitidine (ZANTAC) 300 MG tablet Take 300 mg by mouth at bedtime.       No current facility-administered medications on file prior to visit.    Allergies  Allergen Reactions  . Cephalexin Other (See Comments)    Tongue swelling  . Statins     Weak muscles    Family  History  Problem Relation Age of Onset  . Heart disease Father   . Heart disease Sister     BP 134/80  Pulse 68  Ht 6\' 1"  (1.854 m)  Wt 248 lb (112.492 kg)  BMI 32.73 kg/m2  SpO2 95%  Review of Systems He has slight numbness of the  feet.  Denies sob    Objective:   Physical Exam VITAL SIGNS:  See vs page GENERAL: no distress  Lab Results  Component Value Date   HGBA1C 6.5 05/15/2013   Lab Results  Component Value Date   TSH 0.31* 05/15/2013   T4TOTAL 5.6 07/19/2008   Lab Results  Component Value Date   TESTOSTERONE 343.46* 05/15/2013      Assessment & Plan:  DM: no rx needed now Hyperthyroidism. No rx needed now Hypogonadism, therapy limited by noncompliance.  i'll do the best i can.

## 2013-05-17 ENCOUNTER — Other Ambulatory Visit: Payer: Self-pay | Admitting: Endocrinology

## 2013-05-17 MED ORDER — CLOMIPHENE CITRATE 50 MG PO TABS: ORAL_TABLET | ORAL | Status: DC

## 2013-05-18 ENCOUNTER — Other Ambulatory Visit: Payer: Self-pay

## 2013-06-03 ENCOUNTER — Ambulatory Visit (HOSPITAL_COMMUNITY): Payer: Medicare HMO | Attending: Internal Medicine

## 2013-06-03 DIAGNOSIS — I5022 Chronic systolic (congestive) heart failure: Secondary | ICD-10-CM | POA: Insufficient documentation

## 2013-06-10 ENCOUNTER — Other Ambulatory Visit: Payer: Self-pay

## 2013-06-10 MED ORDER — GABAPENTIN 300 MG PO CAPS: 300.0000 mg | ORAL_CAPSULE | Freq: Every evening | ORAL | Status: DC | PRN

## 2013-06-10 NOTE — Telephone Encounter (Signed)
Done erx 

## 2013-06-23 ENCOUNTER — Other Ambulatory Visit: Payer: Self-pay

## 2013-06-23 MED ORDER — GABAPENTIN 300 MG PO CAPS: 300.0000 mg | ORAL_CAPSULE | Freq: Every evening | ORAL | Status: DC | PRN

## 2013-06-23 NOTE — Telephone Encounter (Signed)
Don erx 

## 2013-06-26 ENCOUNTER — Encounter: Payer: Self-pay | Admitting: Internal Medicine

## 2013-06-26 ENCOUNTER — Other Ambulatory Visit (INDEPENDENT_AMBULATORY_CARE_PROVIDER_SITE_OTHER): Payer: Medicare HMO

## 2013-06-26 ENCOUNTER — Ambulatory Visit (INDEPENDENT_AMBULATORY_CARE_PROVIDER_SITE_OTHER)
Admission: RE | Admit: 2013-06-26 | Discharge: 2013-06-26 | Disposition: A | Discharge status: Home / Self Care | Payer: Medicare HMO | Source: Ambulatory Visit | Attending: Internal Medicine | Admitting: Internal Medicine

## 2013-06-26 ENCOUNTER — Ambulatory Visit (INDEPENDENT_AMBULATORY_CARE_PROVIDER_SITE_OTHER): Payer: Medicare HMO | Admitting: Internal Medicine

## 2013-06-26 VITALS — BP 142/72 | HR 87 | Temp 100.2°F | Ht 73.0 in | Wt 250.0 lb

## 2013-06-26 DIAGNOSIS — E119 Type 2 diabetes mellitus without complications: Secondary | ICD-10-CM

## 2013-06-26 DIAGNOSIS — R05 Cough: Secondary | ICD-10-CM

## 2013-06-26 DIAGNOSIS — R509 Fever, unspecified: Secondary | ICD-10-CM

## 2013-06-26 DIAGNOSIS — I10 Essential (primary) hypertension: Secondary | ICD-10-CM

## 2013-06-26 LAB — CBC WITH DIFFERENTIAL/PLATELET
Basophils Absolute: 0 10*3/uL (ref 0.0–0.1)
Eosinophils Absolute: 0.1 10*3/uL (ref 0.0–0.7)
Hemoglobin: 11.5 g/dL — ABNORMAL LOW (ref 13.0–17.0)
Lymphocytes Relative: 22.5 % (ref 12.0–46.0)
Lymphs Abs: 1.4 10*3/uL (ref 0.7–4.0)
MCHC: 32.6 g/dL (ref 30.0–36.0)
Neutro Abs: 3.9 10*3/uL (ref 1.4–7.7)
Platelets: 236 10*3/uL (ref 150.0–400.0)
RDW: 18.4 % — ABNORMAL HIGH (ref 11.5–14.6)

## 2013-06-26 LAB — BASIC METABOLIC PANEL
CO2: 31 mEq/L (ref 19–32)
Calcium: 10.2 mg/dL (ref 8.4–10.5)
Chloride: 102 mEq/L (ref 96–112)
Glucose, Bld: 138 mg/dL — ABNORMAL HIGH (ref 70–99)
Potassium: 4.6 mEq/L (ref 3.5–5.1)
Sodium: 142 mEq/L (ref 135–145)

## 2013-06-26 LAB — URINALYSIS, ROUTINE W REFLEX MICROSCOPIC
Bilirubin Urine: NEGATIVE
Ketones, ur: NEGATIVE
Specific Gravity, Urine: 1.02 (ref 1.000–1.030)
Urobilinogen, UA: 0.2 (ref 0.0–1.0)

## 2013-06-26 LAB — HEPATIC FUNCTION PANEL
ALT: 20 U/L (ref 0–53)
AST: 18 U/L (ref 0–37)
Alkaline Phosphatase: 52 U/L (ref 39–117)
Bilirubin, Direct: 0.1 mg/dL (ref 0.0–0.3)
Total Protein: 7.4 g/dL (ref 6.0–8.3)

## 2013-06-26 MED ORDER — LEVOFLOXACIN 250 MG PO TABS: ORAL_TABLET | ORAL | Status: DC

## 2013-06-26 NOTE — Assessment & Plan Note (Signed)
?   CAP, for cxr, levaquin renal dosed,  to f/u any worsening symptoms or concerns

## 2013-06-26 NOTE — Progress Notes (Signed)
Subjective:    Patient ID: [name], male    DOB: 1951-05-01, 62 y.o.   MRN: [id number]  HPI  Here by himself today, with c/o 3-4 days worsening prod cough, feverish, general weakness and fatigue, some balance worsening and decreased appetite without n/v, but has had several chills.  Pt denies chest pain, increased sob or doe, wheezing, orthopnea, PND, increased LE swelling, palpitations, dizziness or syncope.Pt denies new neurological symptoms such as new headache, or facial or extremity weakness or numbness   Pt denies polydipsia, polyuria.  Past Medical History  Diagnosis Date  . Hyperthyroidism   . GASTROENTERITIS, VIRAL 10/14/2009  . ONYCHOMYCOSIS, TOENAILS 12/26/2007  . GOITER, MULTINODULAR 12/26/2007  . HYPERTHYROIDISM 02/02/2010  . DIABETES MELLITUS, TYPE II 02/01/2010  . DYSLIPIDEMIA 06/18/2007  . GOUT 06/18/2007  . Hypocalcemia 06/07/2010  . DEMENTIA 09/02/2009  . DEPRESSION 10/14/2009  . PERIPHERAL NEUROPATHY 06/18/2007  . HYPERTENSION 06/18/2007  . CONGESTIVE HEART FAILURE 06/18/2007  . DIASTOLIC HEART FAILURE, CHRONIC 02/06/2009  . Unspecified hypotension 01/30/2010  . PULMONARY NODULE, RIGHT LOWER LOBE 06/08/2009  . GERD 06/18/2007  . ESRD 08/04/2010  . BENIGN PROSTATIC HYPERTROPHY 10/14/2009  . GYNECOMASTIA 07/17/2010  . NECK PAIN 07/31/2010  . FOOT PAIN 08/12/2008  . DIZZINESS 07/17/2010  . Other malaise and fatigue 11/24/2009  . GAIT DISTURBANCE 03/03/2010  . DYSPNEA 10/29/2008  . CHEST PAIN 03/29/2010  . TRANSAMINASES, SERUM, ELEVATED 02/01/2010  . COLONIC POLYPS, HX OF 10/14/2009  . Anemia 06/16/2011  . Ischemic cardiomyopathy 06/16/2011  . Hyperparathyroidism, secondary 06/16/2011  . OSA on CPAP 10/16/2011  . Hyperlipidemia 10/16/2011  . CAD, NATIVE VESSEL 02/06/2009    saw Dr. Missy Sabins last jan 2013  . Sleep apnea     cpap machine and o2   Past Surgical History  Procedure Laterality Date  . Stent 06/11/08    . Neck sugury  2/09  . Back surgury  1998  . Givens capsule study   09/29/2011    Procedure: GIVENS CAPSULE STUDY;  Surgeon: Missy Sabins, MD;  Location: [place];  Service: Endoscopy;  Laterality: Left;  . Esophagogastroduodenoscopy  09/28/2011    Procedure: ESOPHAGOGASTRODUODENOSCOPY (EGD);  Surgeon: Missy Sabins, MD;  Location: [facility] ENDOSCOPY;  Service: Endoscopy;  Laterality: N/A;  . Givens capsule study  09/30/2011    Procedure: GIVENS CAPSULE STUDY;  Surgeon: Jeryl Columbia, MD;  Location: [facility] ENDOSCOPY;  Service: Endoscopy;  Laterality: N/A;  . Cholecystectomy    . Cardiac catheterization    . Tonsillectomy    . Av fistula placement  11/07/2011    Procedure: INSERTION OF ARTERIOVENOUS (AV) GORE-TEX GRAFT ARM;  Surgeon: Tinnie Gens, MD;  Location: [place];  Service: Vascular;  Laterality: Left;  . Dialysis fistula creation  2009    right  . Insertion of dialysis catheter  2014    right side  . Insertion of dialysis catheter Left 02/11/2013    Procedure: INSERTION OF DIALYSIS CATHETER;  Surgeon: Conrad Mitchell, MD;  Location: [place];  Service: Vascular;  Laterality: Left;  Ultrasound guided  . Removal of a dialysis catheter Right 02/11/2013    Procedure: REMOVAL OF A DIALYSIS CATHETER;  Surgeon: Conrad Mansfield, MD;  Location: [place];  Service: Vascular;  Laterality: Right;  . Bascilic vein transposition Right 02/27/2013    Procedure: BASCILIC VEIN TRANSPOSITION;  Surgeon: Mal Misty, MD;  Location: [place];  Service: Vascular;  Laterality: Right;  Right Basilic Vein Transposition     reports that  he has quit smoking. His smoking use included Cigarettes. He has a 25 pack-year smoking history. He has never used smokeless tobacco. He reports that  drinks alcohol. He reports that he does not use illicit drugs. family history includes Heart disease in his father and sister. Allergies  Allergen Reactions  . Cephalexin Other (See Comments)    Tongue swelling  . Statins     Weak muscles   Current Outpatient Prescriptions on File Prior to Visit  Medication Sig  Dispense Refill  . allopurinol (ZYLOPRIM) 100 MG tablet Take 100 mg by mouth daily.       [name] amitriptyline (ELAVIL) 50 MG tablet Take 50 mg by mouth daily as needed (rest).       [name] amLODipine (NORVASC) 10 MG tablet Take 10 mg by mouth daily.      [name] aspirin 81 MG EC tablet Take 81 mg by mouth daily.       . calcium-vitamin D (OSCAL WITH D) 500-200 MG-UNIT per tablet Take 3 tablets by mouth 3 (three) times daily with meals.       . carvedilol (COREG) 25 MG tablet Take 25 mg by mouth 2 (two) times daily with a meal.       . cinacalcet (SENSIPAR) 60 MG tablet Take 60 mg by mouth 2 (two) times daily.       . citalopram (CELEXA) 10 MG tablet Take 1 tablet (10 mg total) by mouth daily.  90 tablet  3  . clomiPHENE (CLOMID) 50 MG tablet 1/4 tab daily  10 tablet  11  . colesevelam (WELCHOL) 625 MG tablet Take 3 tablets (1,875 mg total) by mouth 2 (two) times daily with a meal.  540 tablet  3  . gabapentin (NEURONTIN) 300 MG capsule Take 1 capsule (300 mg total) by mouth at bedtime as needed. For pain  90 capsule  1  . glucose blood (FREESTYLE TEST STRIPS) test strip 1 each by Other route daily. And lancets 1/day 250.40  100 each  12  . lanthanum (FOSRENOL) 1000 MG chewable tablet Chew 1,000 mg by mouth 2 times daily at 12 noon and 4 pm.      . multivitamin (RENA-VIT) TABS tablet Take 1 tablet by mouth daily.        . Omega-3 Fatty Acids (FISH OIL) 1000 MG CAPS Take 1 capsule by mouth daily as needed (for vitamin).       [name] oxyCODONE (ROXICODONE) 5 MG immediate release tablet Take 1 tablet (5 mg total) by mouth every 4 (four) hours as needed for pain.  30 tablet  0  . ranitidine (ZANTAC) 300 MG tablet Take 300 mg by mouth at bedtime.       No current facility-administered medications on file prior to visit.   Review of Systems  Constitutional: Negative for unexpected weight change, or unusual diaphoresis  HENT: Negative for tinnitus.   Eyes: Negative for photophobia and visual disturbance.  Respiratory:  Negative for choking and stridor.   Gastrointestinal: Negative for vomiting and blood in stool.  Genitourinary: Negative for hematuria and decreased urine volume.  Musculoskeletal: Negative for acute joint swelling Skin: Negative for color change and wound.  Neurological: Negative for tremors and numbness other than noted  Psychiatric/Behavioral: Negative for decreased concentration or  hyperactivity.       Objective:   Physical Exam BP 142/72  Pulse 87  Temp(Src) 100.2 F (37.9 C) (Oral)  Ht 6\' 1"  (1.854 m)  Wt 250 lb (113.399 kg)  BMI  32.99 kg/m2  SpO2 94% VS noted, mild ill Constitutional: Pt appears well-developed and well-nourished.  HENT: Head: NCAT.  Right Ear: External ear normal.  Left Ear: External ear normal.  Eyes: Conjunctivae and EOM are normal. Pupils are equal, round, and reactive to light.  Neck: Normal range of motion. Neck supple.  Cardiovascular: Normal rate and regular rhythm.   Pulmonary/Chest: Effort normal and breath sounds decreased with few right lower/mid lung crackles.  Neurological: Pt is alert. Not confused  Skin: Skin is warm. No erythema.  Psychiatric: Pt behavior is normal. Thought content normal.     Assessment & Plan:

## 2013-06-26 NOTE — Assessment & Plan Note (Signed)
Also for blood cx, cbc/labs,  to f/u any worsening symptoms or concerns

## 2013-06-26 NOTE — Patient Instructions (Signed)
Please take all new medication as prescribed - the antibiotic (levaquin) to take ONE pill today, then one pill per day [place], until the prescription is finished Please keep your appointments with your specialists as you have planned - Dialysis Tues, Thur and Saturdays Please continue all other medications as before Please have the pharmacy call with any other refills you may need. Please go to the XRAY Department in the Basement (go straight as you get off the elevator) for the x-ray testing Please go to the LAB in the Basement (turn left off the elevator) for the tests to be done today You will be contacted by phone if any changes need to be made immediately.  Otherwise, you will receive a letter about your results with an explanation, but please check with MyChart first.  Please remember to sign up for My Chart if you have not done so, as this will be important to you in the future with finding out test results, communicating by private email, and scheduling acute appointments online when needed.

## 2013-06-26 NOTE — Assessment & Plan Note (Signed)
stable overall by history and exam, recent data reviewed with pt, and pt to continue medical treatment as before,  to f/u any worsening symptoms or concerns BP Readings from Last 3 Encounters:  06/26/13 142/72  05/15/13 134/80  04/06/13 152/71

## 2013-06-26 NOTE — Assessment & Plan Note (Signed)
stable overall by history and exam, recent data reviewed with pt, and pt to continue medical treatment as before,  to f/u any worsening symptoms or concerns Lab Results  Component Value Date   HGBA1C 6.5 05/15/2013

## 2013-07-03 LAB — CULTURE, BLOOD (SINGLE): Organism ID, Bacteria: NO GROWTH

## 2013-07-08 ENCOUNTER — Ambulatory Visit (INDEPENDENT_AMBULATORY_CARE_PROVIDER_SITE_OTHER): Payer: Medicare HMO | Admitting: Internal Medicine

## 2013-07-08 ENCOUNTER — Encounter: Payer: Self-pay | Admitting: Internal Medicine

## 2013-07-08 VITALS — BP 140/80 | HR 84 | Temp 98.4°F | Ht 73.0 in | Wt 250.1 lb

## 2013-07-08 DIAGNOSIS — F329 Major depressive disorder, single episode, unspecified: Secondary | ICD-10-CM

## 2013-07-08 DIAGNOSIS — R509 Fever, unspecified: Secondary | ICD-10-CM

## 2013-07-08 DIAGNOSIS — I10 Essential (primary) hypertension: Secondary | ICD-10-CM

## 2013-07-08 MED ORDER — AMOXICILLIN 500 MG PO CAPS: 1000.0000 mg | ORAL_CAPSULE | Freq: Two times a day (BID) | ORAL | Status: DC

## 2013-07-08 NOTE — Assessment & Plan Note (Signed)
stable overall by history and exam, recent data reviewed with pt, and pt to continue medical treatment as before,  to f/u any worsening symptoms or concerns BP Readings from Last 3 Encounters:  07/08/13 140/80  06/26/13 142/72  05/15/13 134/80

## 2013-07-08 NOTE — Progress Notes (Signed)
Subjective:    Patient ID: [name], male    DOB: Jul 24, 1951, 62 y.o.   MRN: [id number]  HPI  Here with 2-3 days acute onset feverish, pressure, headache, general weakness and malaise, with mild ST and cough, but pt denies chest pain, wheezing, increased sob or doe, orthopnea, PND, increased LE swelling, palpitations, dizziness or syncope.  Pt denies fever, wt loss, night sweats, loss of appetite, or other constitutional symptoms. Denies worsening depressive symptoms, suicidal ideation, or panic. Denies urinary symptoms such as dysuria, frequency, urgency, flank pain, hematuria or n/v Past Medical History  Diagnosis Date  . Hyperthyroidism   . GASTROENTERITIS, VIRAL 10/14/2009  . ONYCHOMYCOSIS, TOENAILS 12/26/2007  . GOITER, MULTINODULAR 12/26/2007  . HYPERTHYROIDISM 02/02/2010  . DIABETES MELLITUS, TYPE II 02/01/2010  . DYSLIPIDEMIA 06/18/2007  . GOUT 06/18/2007  . Hypocalcemia 06/07/2010  . DEMENTIA 09/02/2009  . DEPRESSION 10/14/2009  . PERIPHERAL NEUROPATHY 06/18/2007  . HYPERTENSION 06/18/2007  . CONGESTIVE HEART FAILURE 06/18/2007  . DIASTOLIC HEART FAILURE, CHRONIC 02/06/2009  . Unspecified hypotension 01/30/2010  . PULMONARY NODULE, RIGHT LOWER LOBE 06/08/2009  . GERD 06/18/2007  . ESRD 08/04/2010  . BENIGN PROSTATIC HYPERTROPHY 10/14/2009  . GYNECOMASTIA 07/17/2010  . NECK PAIN 07/31/2010  . FOOT PAIN 08/12/2008  . DIZZINESS 07/17/2010  . Other malaise and fatigue 11/24/2009  . GAIT DISTURBANCE 03/03/2010  . DYSPNEA 10/29/2008  . CHEST PAIN 03/29/2010  . TRANSAMINASES, SERUM, ELEVATED 02/01/2010  . COLONIC POLYPS, HX OF 10/14/2009  . Anemia 06/16/2011  . Ischemic cardiomyopathy 06/16/2011  . Hyperparathyroidism, secondary 06/16/2011  . OSA on CPAP 10/16/2011  . Hyperlipidemia 10/16/2011  . CAD, NATIVE VESSEL 02/06/2009    saw Dr. Missy Sabins last jan 2013  . Sleep apnea     cpap machine and o2   Past Surgical History  Procedure Laterality Date  . Stent 06/11/08    . Neck sugury  2/09  . Back  surgury  1998  . Givens capsule study  09/29/2011    Procedure: GIVENS CAPSULE STUDY;  Surgeon: Missy Sabins, MD;  Location: [place];  Service: Endoscopy;  Laterality: Left;  . Esophagogastroduodenoscopy  09/28/2011    Procedure: ESOPHAGOGASTRODUODENOSCOPY (EGD);  Surgeon: Missy Sabins, MD;  Location: [facility] ENDOSCOPY;  Service: Endoscopy;  Laterality: N/A;  . Givens capsule study  09/30/2011    Procedure: GIVENS CAPSULE STUDY;  Surgeon: Jeryl Columbia, MD;  Location: [facility] ENDOSCOPY;  Service: Endoscopy;  Laterality: N/A;  . Cholecystectomy    . Cardiac catheterization    . Tonsillectomy    . Av fistula placement  11/07/2011    Procedure: INSERTION OF ARTERIOVENOUS (AV) GORE-TEX GRAFT ARM;  Surgeon: Tinnie Gens, MD;  Location: [place];  Service: Vascular;  Laterality: Left;  . Dialysis fistula creation  2009    right  . Insertion of dialysis catheter  2014    right side  . Insertion of dialysis catheter Left 02/11/2013    Procedure: INSERTION OF DIALYSIS CATHETER;  Surgeon: Conrad Candelero Abajo, MD;  Location: [place];  Service: Vascular;  Laterality: Left;  Ultrasound guided  . Removal of a dialysis catheter Right 02/11/2013    Procedure: REMOVAL OF A DIALYSIS CATHETER;  Surgeon: Conrad , MD;  Location: [place];  Service: Vascular;  Laterality: Right;  . Bascilic vein transposition Right 02/27/2013    Procedure: BASCILIC VEIN TRANSPOSITION;  Surgeon: Mal Misty, MD;  Location: [place];  Service: Vascular;  Laterality: Right;  Right Basilic Vein Transposition  reports that he has quit smoking. His smoking use included Cigarettes. He has a 25 pack-year smoking history. He has never used smokeless tobacco. He reports that he drinks alcohol. He reports that he does not use illicit drugs. family history includes Heart disease in his father and sister. Allergies  Allergen Reactions  . Cephalexin Other (See Comments)    Tongue swelling  . Statins     Weak muscles   Current Outpatient Prescriptions on  File Prior to Visit  Medication Sig Dispense Refill  . allopurinol (ZYLOPRIM) 100 MG tablet Take 100 mg by mouth daily.       [name] amitriptyline (ELAVIL) 50 MG tablet Take 50 mg by mouth daily as needed (rest).       [name] amLODipine (NORVASC) 10 MG tablet Take 10 mg by mouth daily.      [name] aspirin 81 MG EC tablet Take 81 mg by mouth daily.       . calcium-vitamin D (OSCAL WITH D) 500-200 MG-UNIT per tablet Take 3 tablets by mouth 3 (three) times daily with meals.       . carvedilol (COREG) 25 MG tablet Take 25 mg by mouth 2 (two) times daily with a meal.       . cinacalcet (SENSIPAR) 60 MG tablet Take 60 mg by mouth 2 (two) times daily.       . citalopram (CELEXA) 10 MG tablet Take 1 tablet (10 mg total) by mouth daily.  90 tablet  3  . clomiPHENE (CLOMID) 50 MG tablet 1/4 tab daily  10 tablet  11  . colesevelam (WELCHOL) 625 MG tablet Take 3 tablets (1,875 mg total) by mouth 2 (two) times daily with a meal.  540 tablet  3  . gabapentin (NEURONTIN) 300 MG capsule Take 1 capsule (300 mg total) by mouth at bedtime as needed. For pain  90 capsule  1  . glucose blood (FREESTYLE TEST STRIPS) test strip 1 each by Other route daily. And lancets 1/day 250.40  100 each  12  . lanthanum (FOSRENOL) 1000 MG chewable tablet Chew 1,000 mg by mouth 2 times daily at 12 noon and 4 pm.      . levofloxacin (LEVAQUIN) 250 MG tablet Take one tab by mouth today, then 1 pill per day AFTER DIALYSIS only until the prescription is finished  5 tablet  0  . multivitamin (RENA-VIT) TABS tablet Take 1 tablet by mouth daily.        . Omega-3 Fatty Acids (FISH OIL) 1000 MG CAPS Take 1 capsule by mouth daily as needed (for vitamin).       [name] oxyCODONE (ROXICODONE) 5 MG immediate release tablet Take 1 tablet (5 mg total) by mouth every 4 (four) hours as needed for pain.  30 tablet  0  . ranitidine (ZANTAC) 300 MG tablet Take 300 mg by mouth at bedtime.       No current facility-administered medications on file prior to visit.   Review  of Systems  Constitutional: Negative for unexpected weight change, or unusual diaphoresis  HENT: Negative for tinnitus.   Eyes: Negative for photophobia and visual disturbance.  Respiratory: Negative for choking and stridor.   Gastrointestinal: Negative for vomiting and blood in stool.  Genitourinary: Negative for hematuria and decreased urine volume.  Musculoskeletal: Negative for acute joint swelling Skin: Negative for color change and wound.  Neurological: Negative for tremors and numbness other than noted  Psychiatric/Behavioral: Negative for decreased concentration or  hyperactivity.  Objective:   Physical Exam BP 140/80  Pulse 84  Temp(Src) 98.4 F (36.9 C) (Oral)  Ht 6\' 1"  (1.854 m)  Wt 250 lb 2 oz (113.456 kg)  BMI 33.01 kg/m2  SpO2 95% VS noted,  Constitutional: Pt appears well-developed and well-nourished.  HENT: Head: NCAT.  Right Ear: External ear normal.  Left Ear: External ear normal.  Bilat tm's with mild erythema.  Max sinus areas non tender.  Pharynx with mild erythema, no exudate Eyes: Conjunctivae and EOM are normal. Pupils are equal, round, and reactive to light.  Neck: Normal range of motion. Neck supple.  Cardiovascular: Normal rate and regular rhythm.   Pulmonary/Chest: Effort normal and breath sounds normal.  Neurological: Pt is alert. Not confused  Skin: Skin is warm. No erythema.  Psychiatric: Pt behavior is normal. Thought content normal. not depressed affect    Assessment & Plan:

## 2013-07-08 NOTE — Assessment & Plan Note (Signed)
Subjective, afeb on exam, exam findings minimal but ? URI - for amoxil course,   to f/u any worsening symptoms or concerns

## 2013-07-08 NOTE — Assessment & Plan Note (Signed)
stable overall by history and exam, recent data reviewed with pt, and pt to continue medical treatment as before,  to f/u any worsening symptoms or concerns Lab Results  Component Value Date   WBC 6.1 06/26/2013   HGB 11.5* 06/26/2013   HCT 35.1* 06/26/2013   PLT 236.0 06/26/2013   GLUCOSE 138* 06/26/2013   CHOL 274* 06/15/2011   TRIG 275.0* 06/15/2011   HDL 46.10 06/15/2011   LDLDIRECT 175.3 06/15/2011   LDLCALC  Value: 343        Total Cholesterol/HDL:CHD Risk Coronary Heart Disease Risk Table                     Men   Women  1/2 Average Risk   3.4   3.3  Average Risk       5.0   4.4  2 X Average Risk   9.6   7.1  3 X Average Risk  23.4   11.0        Use the calculated Patient Ratio above and the CHD Risk Table to determine the patient's CHD Risk.        ATP III CLASSIFICATION (LDL):  <100     mg/dL   Optimal  100-129  mg/dL   Near or Above                    Optimal  130-159  mg/dL   Borderline  160-189  mg/dL   High  >190     mg/dL   Very High* 12/28/2009   ALT 20 06/26/2013   AST 18 06/26/2013   NA 142 06/26/2013   K 4.6 06/26/2013   CL 102 06/26/2013   CREATININE 9.6* 06/26/2013   BUN 32* 06/26/2013   CO2 31 06/26/2013   TSH 0.31* 05/15/2013   PSA 2.20 06/15/2011   INR 0.91 11/07/2011   HGBA1C 6.5 05/15/2013

## 2013-07-08 NOTE — Patient Instructions (Signed)
Please take all new medication as prescribed Please continue all other medications as before, and refills have been done if requested. You can also take Delsym OTC for cough, and/or Mucinex (or it's generic off brand) for congestion, and tylenol as needed for pain. Please have the pharmacy call with any other refills you may need. Please keep your appointments with your specialists as you have planned  - kidney doctor and dialysis

## 2013-07-15 ENCOUNTER — Ambulatory Visit: Payer: Medicare HMO | Admitting: Internal Medicine

## 2013-07-15 ENCOUNTER — Ambulatory Visit (HOSPITAL_COMMUNITY)
Admission: RE | Admit: 2013-07-15 | Discharge: 2013-07-15 | Disposition: A | Discharge status: Home / Self Care | Payer: Medicare HMO | Source: Ambulatory Visit | Attending: Internal Medicine | Admitting: Internal Medicine

## 2013-07-15 VITALS — BP 150/92 | HR 78 | Wt 247.5 lb

## 2013-07-15 DIAGNOSIS — I5022 Chronic systolic (congestive) heart failure: Secondary | ICD-10-CM

## 2013-07-15 DIAGNOSIS — R5381 Other malaise: Secondary | ICD-10-CM

## 2013-07-15 MED ORDER — CARVEDILOL 25 MG PO TABS: 12.5000 mg | ORAL_TABLET | Freq: Two times a day (BID) | ORAL | Status: DC

## 2013-07-15 NOTE — Patient Instructions (Addendum)
Follow up in 2- 3 month  Please schedule an appointment with an Opthamologist  Take carvedilol 12.5 mg twice a day  Do the following things EVERYDAY: 1) Weigh yourself in the morning before breakfast. Write it down and keep it in a log. 2) Take your medicines as prescribed 3) Eat low salt foods-Limit salt (sodium) to 2000 mg per day.  4) Stay as active as you can everyday 5) Limit all fluids for the day to less than 2 liters

## 2013-07-15 NOTE — Progress Notes (Signed)
Patient ID: [name], male   DOB: [birth year], 62 y.o.   MRN: [id number] PCP: Dr [name] Nephrologist: Dr [name]  HPI: [name] is 62 year old male with PMH: obesity, DM, COPD on nighttime oxygen, sleep apnea on CPAP, ESRD- HD, CAD, S/P Taxus drug-eluting stent to the right coronary in September [id number], chronic systolic/diastolic heart failure ECHO 12/12 EF 55% grade 2, KL (Unable to tolerate statins so placed on Welchol)  Had RHC in 2/11 which showed low pressures (PCWP = 2) , PA 26/16 (22)  and normal cardiac output.  Echo 12/12: EF 55% Myoview 10/1512: EF 33%. LV Wall Motion: There is global hypokinesis. The LV is markedly enlarged. Small fixed apical defect.  cMRI 02/19/13: EF 37% dilated LV. diffuse HK  Evaluated at [facility] for kidney transplant but he was not felt to be candidate (11/2012) due to poor mobility, DM, and coronary disease.   CPX 06/04/13   Resting HR: 76 Peak HR: 109 (70% age predicted max HR) BP rest: 107/70 BP peak: 154/69 Peak VO2: 11.8 (53.7% predicted peak VO2) - when corrected to ibw pVO2 15.7 VE/VCO2 slope: 28.8 OUES: 1.90 Peak RER: 1.06  Ve/MVV 34.7%  At last visit we ordered CPX due to exertional fatigue. He returns for follow up. Complains of headaches most of the day and fatigue. Evaluated by Dr [name]  10/8 ? URI started on amoxicillin. He says fevers better but still feels cold.    Able to maintain dry weight 111.4.5 kg . HD on Tues, Thur, Sat. BP drops with HD but doesn't have to come off. Not active at home. He says that he just wants to stay in bed. C/o knee pain and ortho suggested considering knee replacement. Poor appetite. Denies orthopnea, PND, edema.    Past Medical History  Diagnosis Date  . Hyperthyroidism   . GASTROENTERITIS, VIRAL 10/14/2009  . ONYCHOMYCOSIS, TOENAILS 12/26/2007  . GOITER, MULTINODULAR 12/26/2007  . HYPERTHYROIDISM 02/02/2010  . DIABETES MELLITUS, TYPE II 02/01/2010  . DYSLIPIDEMIA 06/18/2007  . GOUT 06/18/2007  . Hypocalcemia 06/07/2010   . DEMENTIA 09/02/2009  . DEPRESSION 10/14/2009  . PERIPHERAL NEUROPATHY 06/18/2007  . HYPERTENSION 06/18/2007  . CONGESTIVE HEART FAILURE 06/18/2007  . DIASTOLIC HEART FAILURE, CHRONIC 02/06/2009  . Unspecified hypotension 01/30/2010  . PULMONARY NODULE, RIGHT LOWER LOBE 06/08/2009  . GERD 06/18/2007  . ESRD 08/04/2010  . BENIGN PROSTATIC HYPERTROPHY 10/14/2009  . GYNECOMASTIA 07/17/2010  . NECK PAIN 07/31/2010  . FOOT PAIN 08/12/2008  . DIZZINESS 07/17/2010  . Other malaise and fatigue 11/24/2009  . GAIT DISTURBANCE 03/03/2010  . DYSPNEA 10/29/2008  . CHEST PAIN 03/29/2010  . TRANSAMINASES, SERUM, ELEVATED 02/01/2010  . COLONIC POLYPS, HX OF 10/14/2009  . Anemia 06/16/2011  . Ischemic cardiomyopathy 06/16/2011  . Hyperparathyroidism, secondary 06/16/2011  . OSA on CPAP 10/16/2011  . Hyperlipidemia 10/16/2011  . CAD, NATIVE VESSEL 02/06/2009    saw Dr. Missy [name] last jan 2013  . Sleep apnea     cpap machine and o2    Current Outpatient Prescriptions  Medication Sig Dispense Refill  . allopurinol (ZYLOPRIM) 100 MG tablet Take 100 mg by mouth daily.       [name] amitriptyline (ELAVIL) 50 MG tablet Take 50 mg by mouth daily as needed (rest).       [name] amLODipine (NORVASC) 10 MG tablet Take 10 mg by mouth daily.      [name] aspirin 81 MG EC tablet Take 81 mg by mouth daily.       . calcium-vitamin D (  OSCAL WITH D) 500-200 MG-UNIT per tablet Take 3 tablets by mouth 3 (three) times daily with meals.       . carvedilol (COREG) 25 MG tablet Take 25 mg by mouth 2 (two) times daily with a meal.       . cinacalcet (SENSIPAR) 60 MG tablet Take 60 mg by mouth 2 (two) times daily.       . clomiPHENE (CLOMID) 50 MG tablet 1/4 tab daily  10 tablet  11  . colesevelam (WELCHOL) 625 MG tablet Take 3 tablets (1,875 mg total) by mouth 2 (two) times daily with a meal.  540 tablet  3  . gabapentin (NEURONTIN) 300 MG capsule Take 1 capsule (300 mg total) by mouth at bedtime as needed. For pain  90 capsule  1  . lanthanum (FOSRENOL)  1000 MG chewable tablet Chew 1,000 mg by mouth 2 times daily at 12 noon and 4 pm.      . multivitamin (RENA-VIT) TABS tablet Take 1 tablet by mouth daily.        . Omega-3 Fatty Acids (FISH OIL) 1000 MG CAPS Take 1 capsule by mouth daily as needed (for vitamin).       . ranitidine (ZANTAC) 300 MG tablet Take 300 mg by mouth at bedtime.      [name] glucose blood (FREESTYLE TEST STRIPS) test strip 1 each by Other route daily. And lancets 1/day 250.40  100 each  12  . oxyCODONE (ROXICODONE) 5 MG immediate release tablet Take 1 tablet (5 mg total) by mouth every 4 (four) hours as needed for pain.  30 tablet  0   No current facility-administered medications for this encounter.     PHYSICAL EXAM: Filed Vitals:   07/15/13 1324  BP: 150/92  Pulse: 78   General:  Well appearing. No resp difficulty Wife present HEENT: normal Neck: supple. JVP flat. Carotids 2+ bilaterally; no bruits. No lymphadenopathy or thryomegaly appreciated. Cor: PMI normal. Regular rate & rhythm. No rubs, gallops 2/6 SEM LUSB. L chest perm-cath Lungs: clear Abdomen: obese  soft, nontender, nondistended. No hepatosplenomegaly. No bruits or masses. Good bowel sounds. Extremities: no cyanosis, clubbing, rash, edema. + grafts in both UEs  Neuro: alert & orientedx3, cranial nerves grossly intact. Moves all 4 extremities w/o difficulty. Affect pleasant.   ASSESSMENT & PLAN:  1. Chronic Systolic Heart Fatigue. EF 37% per c-MRI. NYHA IIIB.  Dr [name] reviewed and discussed CPX.   Volume status stable with HD.  Due to profound fatigue will cut carvedilol back to 12.5 mg twice a day.  Would like to get him in cardiac rehab after he improves.   2. ESRD-  Dry Weight 111.5 kg HD Tues/Thur/Sat  3. Headaches- F/U with opthamologist   4. Osteoarthritis Hold off on knee replacement for now.   Follow up in 1 month  [name],[name]   Patient seen and examined with [name] Grinder, NP. We discussed all aspects of the encounter. I agree with  the assessment and plan as stated above. I reviewed CPX test with him and his wife. No clear HF or respiratory limitation to exercise. Main limitation seems to be deconditioning and obesity. There is also evidence of chronotropic incompetence. Will cut carvedilol back to 12.5 bid and see if this helps him feel better. Stressed need to be more active and he needs to consider going to cardiac or pulmonary rehab. I suspect he has significant depression which is adding to his overall decline. Would consider SSRI. As far as a  knee replacement goes, I think he can tolerate surgery from a cardiac perspective but would like to see him get in better shape first.   Benay Spice 2:15 PM

## 2013-07-18 IMAGING — RF DG UGI W/ HIGH DENSITY W/KUB
18 of 20 series · 18 of 20 positions shown · non-contrast
Comparison: No priors.

CLINICAL DATA: Nausea, vomiting and dysphagia. Evaluate for
esophageal stricture.

UPPER GI SERIES WITH KUB
TECHNIQUE: Routine upper GI series was performed with thin and
high density barium.
Fluoroscopy Time: 4.3 minutes

[Series 1: run · 1 of 1 slices shown (1 of 17)]
[im 1/1]
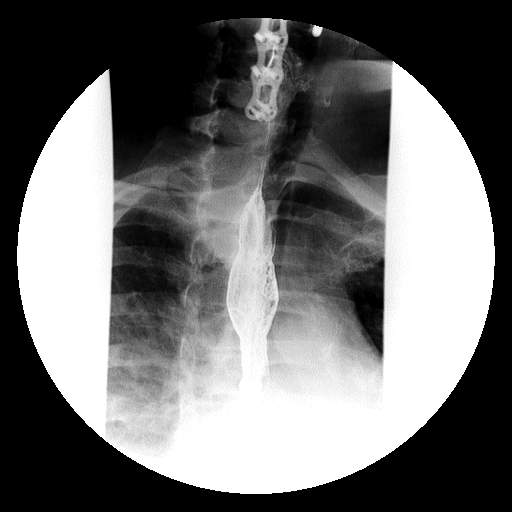

[Series 2: run · 1 of 1 slices shown (2 of 17)]
[im 1/1]
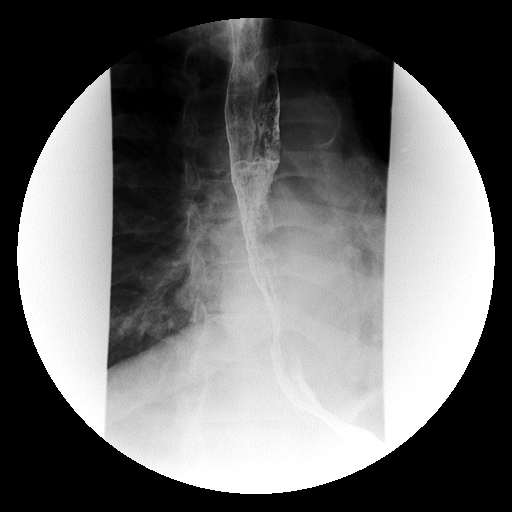

[Series 3: run · 1 of 1 slices shown (3 of 17)]
[im 1/1]
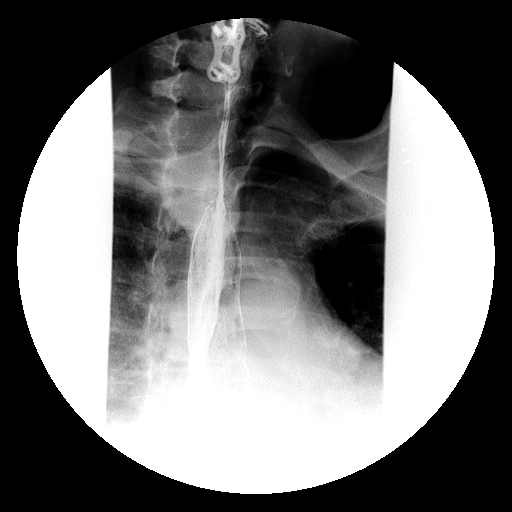

[Series 4: run · 1 of 1 slices shown (4 of 17)]
[im 1/1]
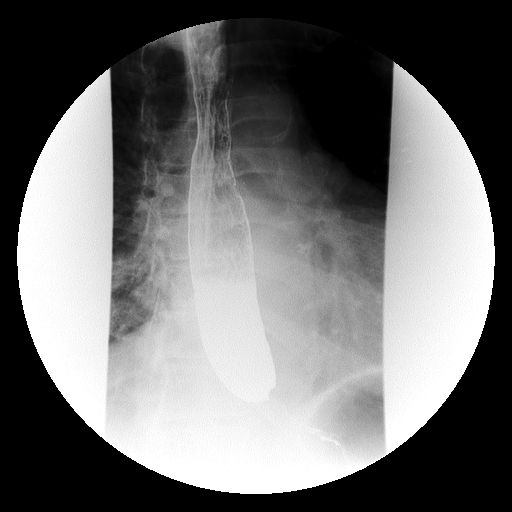

[Series 6: run · 1 of 1 slices shown (5 of 17)]
[im 1/1]
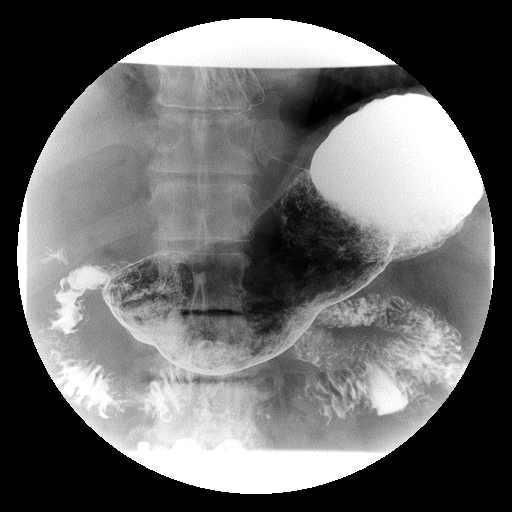

[Series 7: run · 1 of 1 slices shown (6 of 17)]
[im 1/1]
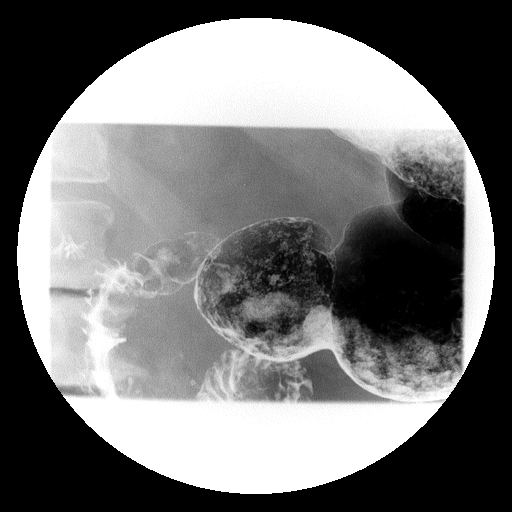

[Series 8: run · 1 of 1 slices shown (7 of 17)]
[im 1/1]
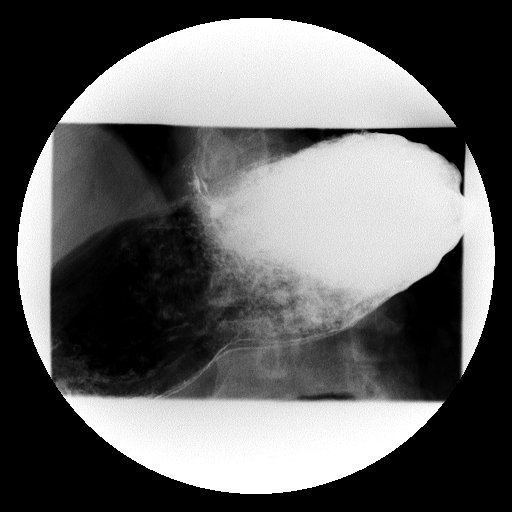

[Series 9: run · 1 of 1 slices shown (8 of 17)]
[im 1/1]
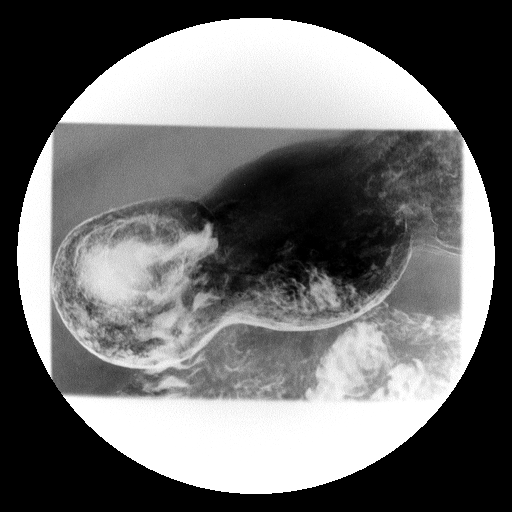

[Series 10: run · 1 of 1 slices shown (9 of 17)]
[im 1/1]
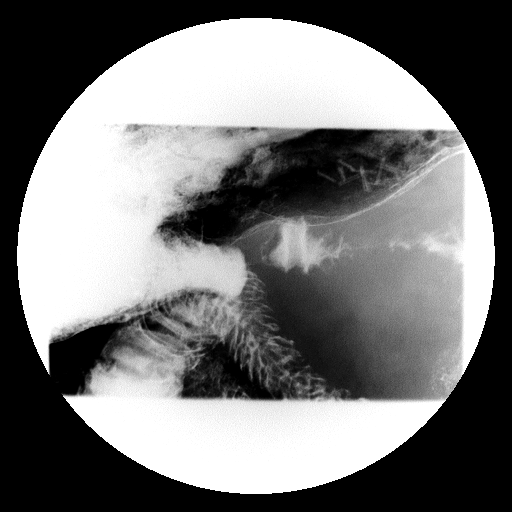

[Series 11: run · 1 of 1 slices shown (10 of 17)]
[im 1/1]
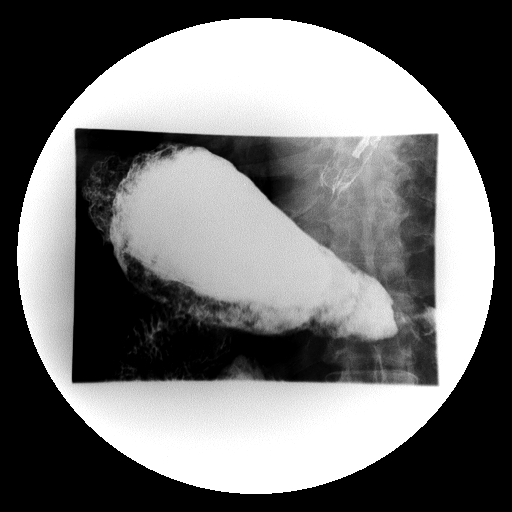

[Series 12: run · 1 of 1 slices shown (11 of 17)]
[im 1/1]
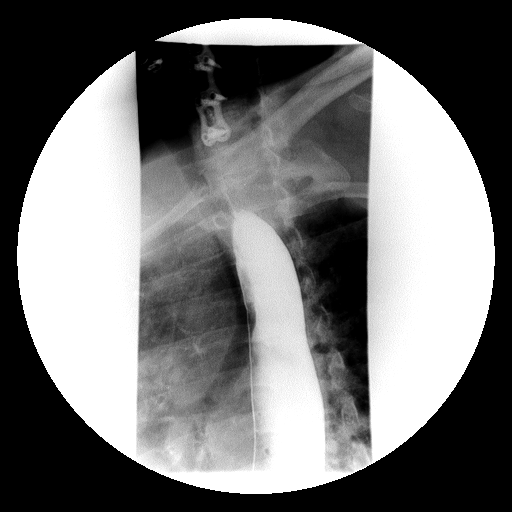

[Series 13: run · 1 of 1 slices shown (12 of 17)]
[im 1/1]
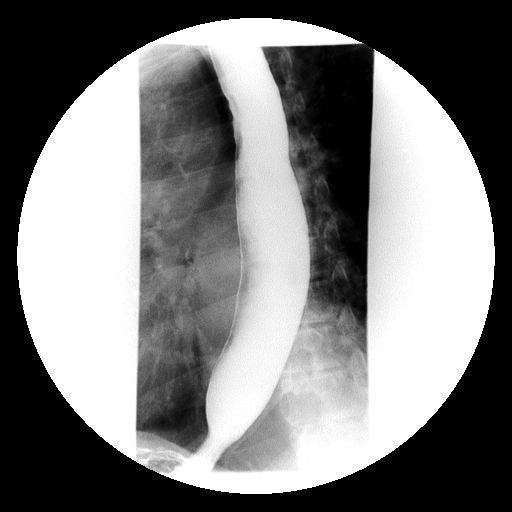

[Series 14: run · 1 of 1 slices shown (13 of 17)]
[im 1/1]
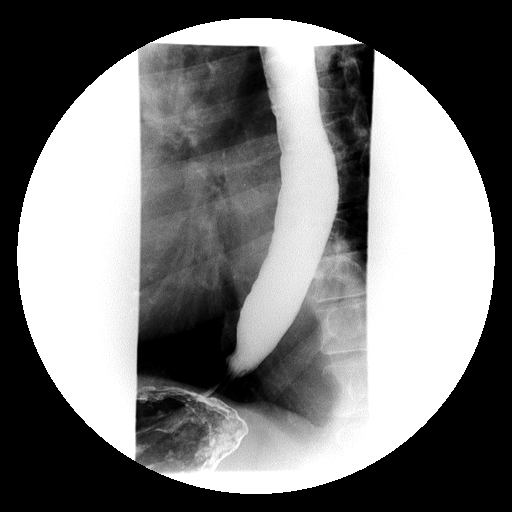

[Series 16: run · 1 of 1 slices shown (14 of 17)]
[im 1/1]
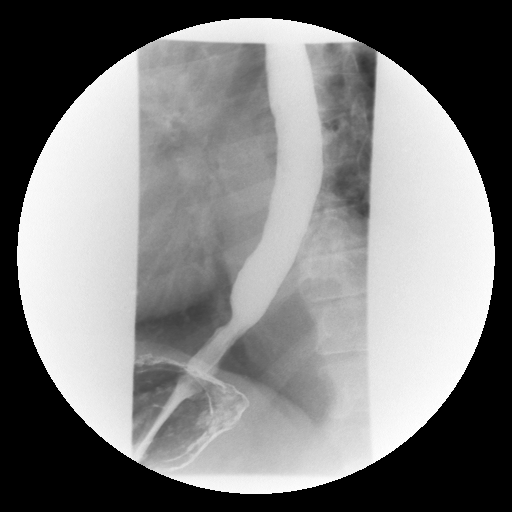

[Series 17: run · 1 of 1 slices shown (15 of 17)]
[im 1/1]
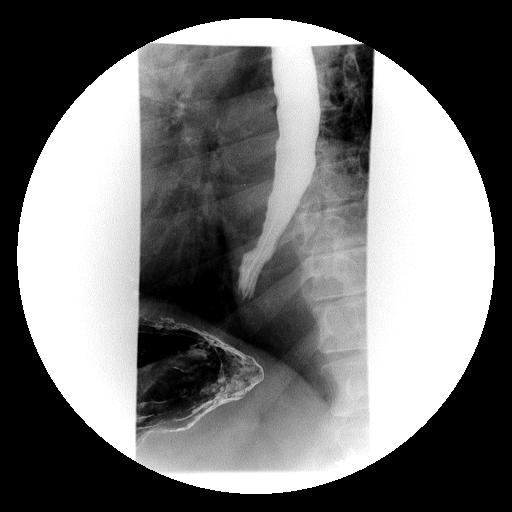

[Series 18: run · 1 of 1 slices shown (16 of 17)]
[im 1/1]
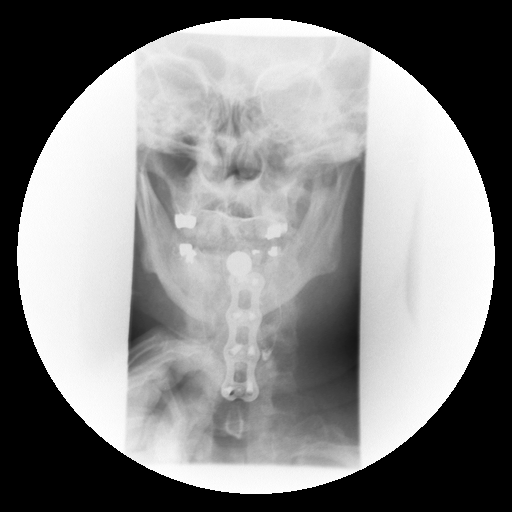

[Series 19: run · 1 of 1 slices shown (17 of 17)]
[im 1/1]
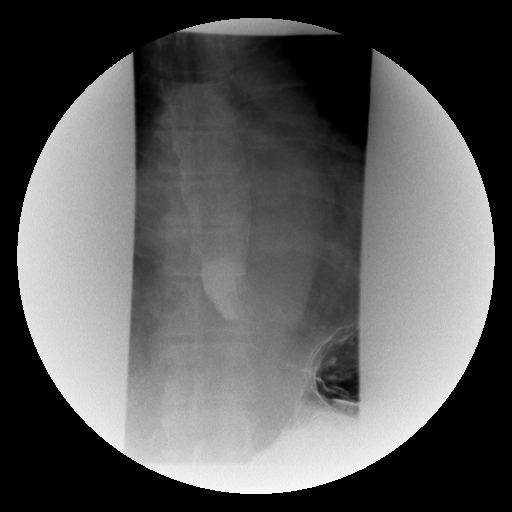

[Series 1001: view not recorded · 0.20mm/px · 1 of 1 slices shown]
[im 1/1]
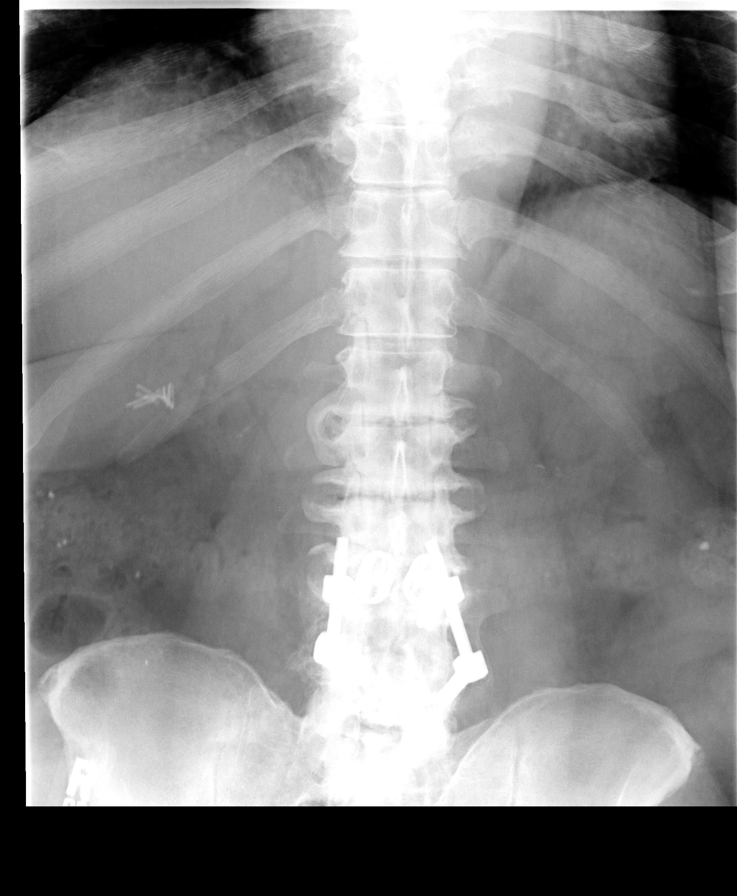

[18 of 20 positions shown; findings below may reference images not displayed]

FINDINGS: Initial KUB demonstrating nonobstructive bowel gas
pattern with surgical clips projecting over the right upper
quadrant of the abdomen, compatible with prior cholecystectomy.
Orthopedic fixation hardware in the lower lumbar spine.

Initial double contrast barium esophagram demonstrated a normal
appearance of the esophageal mucosa.  Subsequent single swallow
attempts were observed, and there was failure to propagate the
primary peristaltic wave in [DATE] observed attempts. No significant
tertiary contractions were noted.  Full column esophagram was
remarkable for a small hiatal hernia.  No esophageal mass,
esophageal ring or stricture was identified.

Subsequent images of the stomach demonstrated no definite gastric
ulcer or gastric mass.  Duodenal bulb was normal in appearance.
IMPRESSION: 1.  Nonspecific esophageal motility disordered.
2.  Small hiatal hernia.
3.  No evidence of esophageal stricture.

## 2013-08-04 ENCOUNTER — Encounter: Payer: Self-pay | Admitting: Internal Medicine

## 2013-08-17 ENCOUNTER — Encounter: Payer: Self-pay | Admitting: Endocrinology

## 2013-08-17 ENCOUNTER — Ambulatory Visit (INDEPENDENT_AMBULATORY_CARE_PROVIDER_SITE_OTHER): Payer: Medicare HMO | Admitting: Endocrinology

## 2013-08-17 VITALS — BP 180/100 | HR 52 | Temp 98.5°F | Resp 16 | Ht 73.0 in | Wt 248.6 lb

## 2013-08-17 DIAGNOSIS — N400 Enlarged prostate without lower urinary tract symptoms: Secondary | ICD-10-CM

## 2013-08-17 DIAGNOSIS — E119 Type 2 diabetes mellitus without complications: Secondary | ICD-10-CM

## 2013-08-17 DIAGNOSIS — E042 Nontoxic multinodular goiter: Secondary | ICD-10-CM

## 2013-08-17 DIAGNOSIS — E291 Testicular hypofunction: Secondary | ICD-10-CM

## 2013-08-17 NOTE — Patient Instructions (Addendum)
blood tests are being requested for you today.  We'll contact you with results. Please come back for a follow-up appointment in 3 months. check your blood sugar once a day.  vary the time of day when you check, between before the 3 meals, and at bedtime.  also check if you have symptoms of your blood sugar being too high or too low.  please keep a record of the readings and bring it to your next appointment here.  You can write it on any piece of paper.  please call us sooner if your blood sugar goes below 70, or if you have a lot of readings over 200.

## 2013-08-17 NOTE — Progress Notes (Signed)
Subjective:    Patient ID: [name], male    DOB: 11-29-1950, 62 y.o.   MRN: [id number]  HPI The state of at least three ongoing medical problems is addressed today: Pt returns for f/u of type 2 DM (dx'ed 2003, on a routine blood test; he took insulin from 2010 until 2013; he has not recently required any medication (except he takes welchol); complicated by peripheral sensory neuropathy and CAD).  He has lost a few lbs.  no cbg record, but states cbg's are well-controlled.   Hyperthyroidism: he has not recently required tapazole.  He has fatigue.  Hypogonadism was noted at dialysis.  He has generalized muscle weakness.  He says ED sxs persist.  He occasionally misses the clomid.  Past Medical History  Diagnosis Date  . Hyperthyroidism   . GASTROENTERITIS, VIRAL 10/14/2009  . ONYCHOMYCOSIS, TOENAILS 12/26/2007  . GOITER, MULTINODULAR 12/26/2007  . HYPERTHYROIDISM 02/02/2010  . DIABETES MELLITUS, TYPE II 02/01/2010  . DYSLIPIDEMIA 06/18/2007  . GOUT 06/18/2007  . Hypocalcemia 06/07/2010  . DEMENTIA 09/02/2009  . DEPRESSION 10/14/2009  . PERIPHERAL NEUROPATHY 06/18/2007  . HYPERTENSION 06/18/2007  . CONGESTIVE HEART FAILURE 06/18/2007  . DIASTOLIC HEART FAILURE, CHRONIC 02/06/2009  . Unspecified hypotension 01/30/2010  . PULMONARY NODULE, RIGHT LOWER LOBE 06/08/2009  . GERD 06/18/2007  . ESRD 08/04/2010  . BENIGN PROSTATIC HYPERTROPHY 10/14/2009  . GYNECOMASTIA 07/17/2010  . NECK PAIN 07/31/2010  . FOOT PAIN 08/12/2008  . DIZZINESS 07/17/2010  . Other malaise and fatigue 11/24/2009  . GAIT DISTURBANCE 03/03/2010  . DYSPNEA 10/29/2008  . CHEST PAIN 03/29/2010  . TRANSAMINASES, SERUM, ELEVATED 02/01/2010  . COLONIC POLYPS, HX OF 10/14/2009  . Anemia 06/16/2011  . Ischemic cardiomyopathy 06/16/2011  . Hyperparathyroidism, secondary 06/16/2011  . OSA on CPAP 10/16/2011  . Hyperlipidemia 10/16/2011  . CAD, NATIVE VESSEL 02/06/2009    saw Dr. Missy Sabins last jan 2013  . Sleep apnea     cpap machine and o2     Past Surgical History  Procedure Laterality Date  . Stent 06/11/08    . Neck sugury  2/09  . Back surgury  1998  . Givens capsule study  09/29/2011    Procedure: GIVENS CAPSULE STUDY;  Surgeon: Missy Sabins, MD;  Location: [place];  Service: Endoscopy;  Laterality: Left;  . Esophagogastroduodenoscopy  09/28/2011    Procedure: ESOPHAGOGASTRODUODENOSCOPY (EGD);  Surgeon: Missy Sabins, MD;  Location: [facility] ENDOSCOPY;  Service: Endoscopy;  Laterality: N/A;  . Givens capsule study  09/30/2011    Procedure: GIVENS CAPSULE STUDY;  Surgeon: Jeryl Columbia, MD;  Location: [facility] ENDOSCOPY;  Service: Endoscopy;  Laterality: N/A;  . Cholecystectomy    . Cardiac catheterization    . Tonsillectomy    . Av fistula placement  11/07/2011    Procedure: INSERTION OF ARTERIOVENOUS (AV) GORE-TEX GRAFT ARM;  Surgeon: Tinnie Gens, MD;  Location: [place];  Service: Vascular;  Laterality: Left;  . Dialysis fistula creation  2009    right  . Insertion of dialysis catheter  2014    right side  . Insertion of dialysis catheter Left 02/11/2013    Procedure: INSERTION OF DIALYSIS CATHETER;  Surgeon: Conrad Aurora, MD;  Location: [place];  Service: Vascular;  Laterality: Left;  Ultrasound guided  . Removal of a dialysis catheter Right 02/11/2013    Procedure: REMOVAL OF A DIALYSIS CATHETER;  Surgeon: Conrad Hatton, MD;  Location: [place];  Service: Vascular;  Laterality: Right;  . Bascilic vein transposition  Right 02/27/2013    Procedure: BASCILIC VEIN TRANSPOSITION;  Surgeon: Mal Misty, MD;  Location: [facility] OR;  Service: Vascular;  Laterality: Right;  Right Basilic Vein Transposition     History   Social History  . Marital Status: Married    Spouse Name: N/A    Number of Children: N/A  . Years of Education: N/A   Occupational History  . disabled   . formerly Lawyer for [employer].    Social History Main Topics  . Smoking status: Former Smoker -- 1.00 packs/day for 25 years    Types: Cigarettes  .  Smokeless tobacco: Never Used     Comment: Quit smoking 2007 Smoked x 25 years 1/2 ppd.  . Alcohol Use: Yes     Comment: occassional   . Drug Use: No  . Sexual Activity: Not on file   Other Topics Concern  . Not on file   Social History Narrative  . No narrative on file    Current Outpatient Prescriptions on File Prior to Visit  Medication Sig Dispense Refill  . allopurinol (ZYLOPRIM) 100 MG tablet Take 100 mg by mouth daily.       [name] amitriptyline (ELAVIL) 50 MG tablet Take 50 mg by mouth daily as needed (rest).       [name] amLODipine (NORVASC) 10 MG tablet Take 10 mg by mouth daily.      [name] aspirin 81 MG EC tablet Take 81 mg by mouth daily.       . calcium-vitamin D (OSCAL WITH D) 500-200 MG-UNIT per tablet Take 3 tablets by mouth 3 (three) times daily with meals.       . carvedilol (COREG) 25 MG tablet Take 0.5 tablets (12.5 mg total) by mouth 2 (two) times daily with a meal.  30 tablet  6  . cinacalcet (SENSIPAR) 60 MG tablet Take 60 mg by mouth 2 (two) times daily.       . clomiPHENE (CLOMID) 50 MG tablet 1/4 tab daily  10 tablet  11  . colesevelam (WELCHOL) 625 MG tablet Take 3 tablets (1,875 mg total) by mouth 2 (two) times daily with a meal.  540 tablet  3  . gabapentin (NEURONTIN) 300 MG capsule Take 1 capsule (300 mg total) by mouth at bedtime as needed. For pain  90 capsule  1  . glucose blood (FREESTYLE TEST STRIPS) test strip 1 each by Other route daily. And lancets 1/day 250.40  100 each  12  . lanthanum (FOSRENOL) 1000 MG chewable tablet Chew 1,000 mg by mouth 2 times daily at 12 noon and 4 pm.      . multivitamin (RENA-VIT) TABS tablet Take 1 tablet by mouth daily.        . Omega-3 Fatty Acids (FISH OIL) 1000 MG CAPS Take 1 capsule by mouth daily as needed (for vitamin).       [name] oxyCODONE (ROXICODONE) 5 MG immediate release tablet Take 1 tablet (5 mg total) by mouth every 4 (four) hours as needed for pain.  30 tablet  0  . ranitidine (ZANTAC) 300 MG tablet Take 300 mg by  mouth at bedtime.       No current facility-administered medications on file prior to visit.    Allergies  Allergen Reactions  . Cephalexin Other (See Comments)    Tongue swelling  . Statins     Weak muscles    Family History  Problem Relation Age of Onset  . Heart disease Father   .  Heart disease Sister     BP 180/100  Pulse 52  Temp(Src) 98.5 F (36.9 C) (Oral)  Resp 16  Ht 6\' 1"  (1.854 m)  Wt 248 lb 9.6 oz (112.764 kg)  BMI 32.81 kg/m2    Review of Systems He urinates daily, but stream is slow.      Objective:   Physical Exam VITAL SIGNS:  See vs page GENERAL: no distress GENITALIA: Normal male testicles, scrotum, and penis.     Lab Results  Component Value Date   TESTOSTERONE 531.47 08/17/2013   Lab Results  Component Value Date   TSH 0.68 08/17/2013   T4TOTAL 5.6 07/19/2008   Lab Results  Component Value Date   HGBA1C 5.6 08/17/2013      Assessment & Plan:  Hypogonadism: well-controlled Hyperthyroidism: well-controlled DM: well-controlled

## 2013-08-18 LAB — TSH: TSH: 0.68 u[IU]/mL (ref 0.35–5.50)

## 2013-08-18 LAB — T4, FREE: Free T4: 0.9 ng/dL (ref 0.60–1.60)

## 2013-08-18 LAB — PSA: PSA: 3.25 ng/mL (ref 0.10–4.00)

## 2013-08-18 LAB — HEMOGLOBIN A1C: Hgb A1c MFr Bld: 5.6 % (ref 4.6–6.5)

## 2013-08-19 ENCOUNTER — Telehealth: Payer: Self-pay | Admitting: *Deleted

## 2013-08-19 NOTE — Telephone Encounter (Signed)
Message copied by Lowry Ram on Wed Aug 19, 2013  9:07 AM ------      Message from: Renato Shin      Created: Wed Aug 19, 2013  7:33 AM       please call patient:      Good results      Please continue the same medications      i'll see you next time. ------

## 2013-09-01 IMAGING — US IR DECLOT *L*
1 series · 1 of 1 positions shown · non-contrast
Comparison: None available

CLINICAL DATA: Occluded dialysis graft

DIALYSIS GRAFT DECLOT
VENOUS ANGIOPLASTY
ULTRASOUND GUIDANCE FOR VASCULAR ACCESS X2
TECHNIQUE: The procedure, risks (including but not limited to
bleeding, infection, organ damage), benefits, and alternatives were
explained to the patient.  Questions regarding the procedure were
encouraged and answered.  The patient understands and consents to
the procedure.

[Series 1: ir declot *left* · 1 of 1 slices shown]
[im 1/1]
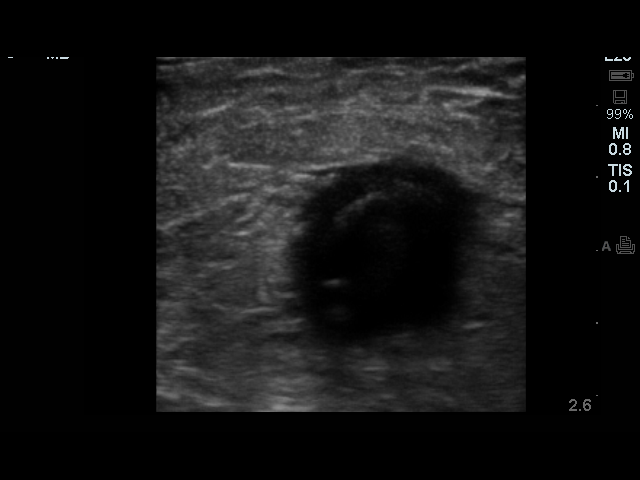

[1 of 1 positions shown; findings below may reference images not displayed]

Intravenous Fentanyl and Versed were administered as conscious
sedation during continuous cardiorespiratory monitoring by the
radiology RN, with a total moderate sedation time of 27 minutes.

 The graft just central to the arterial anastomosis was accessed
antegrade with a 21-gauge micropuncture needle under real-time
ultrasonic guidance after the overlying skin prepped with Betadine,
draped in usual sterile fashion, infiltrated locally with 1%
lidocaine. Needle exchanged over 018 guidewire for transitional
dilator through which 2 mg t-PA was administered. Ultrasound
imaging documentation was saved. Through the   dilator, a Bentson
wire was advanced to the venous anastomosis. Over this a 6F sheath
was placed, through which a 5 French Kumpe catheter was advanced
for outflow venography. This showed patency of the outflow  venous
system through the SVC. 1999 units heparin were administered. The
Arrow PTD device was used to macerate thrombus in the graft.

 In similar fashion, the graft just peripheral to the venous
anastomosis was accessed retrograde under ultrasound with a
micropuncture needle, exchanged for a transitional dilator. The
venous limb dilator was exchanged in similar fashion for a 6 French
vascular sheath. The PTD device was advanced to the arterial
anastomosis and used to macerate thrombus in the graft.  The guide
wire was advanced to the through the Kumpe catheter into the
proximal brachial artery, and over this a Fogarty balloon catheter
was used to dislodge the platelet plug from the graft arterial
anastomosis of the graft with two passes.

Due to continued to occlusion to antegrade flow, the central aspect
of the graft and venous anastomosis were dilated with cysts a 7 mm
x 4 cm Conquest angioplasty balloon with overlapping inflations at
30 atmospheres.  Injection showed   clearance of thrombus from the
graft.  Balloon was removed and injection showed patency of the
anastomosis, without extravasation or other apparent complication.
  A follow shuntogram was performed, demonstrating good flow
through the outflow vein, no extravasation. Reflux across the
arterial anastomosis demonstrate this to be widely patent, with
unremarkable appearance of the visualized native arterial
circulation. The catheter and sheaths were then removed and
hemostasis achieved with 2-0 Ethilon sutures. Patient tolerated
procedure well.

IMPRESSION

1. Technically successful declot of left upper arm straight
synthetic   hemodialysis graft.

2. Technically successful balloon angioplasty of central graft and
venous anastomotic stenosis.

Access management: Remains approachable for percutaneous
intervention as needed.

## 2013-09-17 IMAGING — XA IR DECLOT *L*
1 series · 12 of 24 positions shown · non-contrast
Comparison: 11/10/2012

CLINICAL DATA: Occluded dialysis graft, 2 weeks post declot and
venous angioplasty.

DIALYSIS GRAFT DECLOT
VENOUS COVERED STENT ASSISTED ANGIOPLASTY
ULTRASOUND GUIDANCE FOR VASCULAR ACCESS X2
TECHNIQUE: The procedure, risks (including but not limited to
bleeding, infection, organ damage), benefits, and alternatives were
explained to the patient.  Questions regarding the procedure were
encouraged and answered.  The patient understands and consents to
the procedure.

[Series 1: run · 12 of 56 slices shown]
[im 3/56]
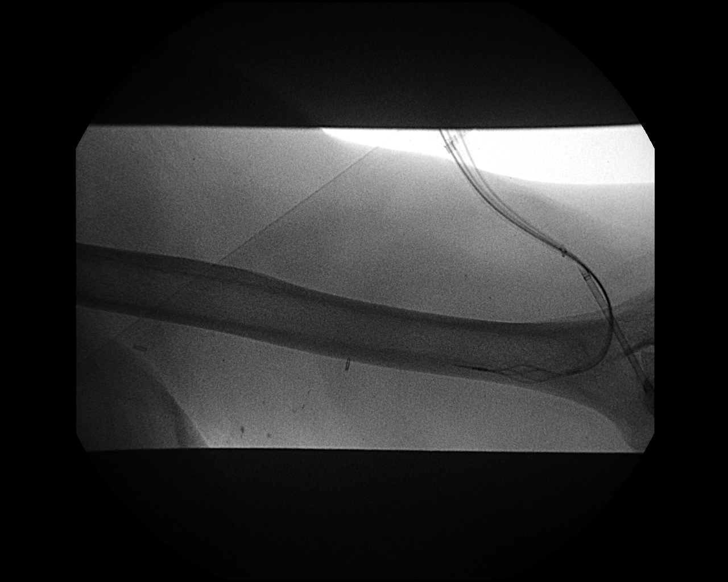
[im 8/56]
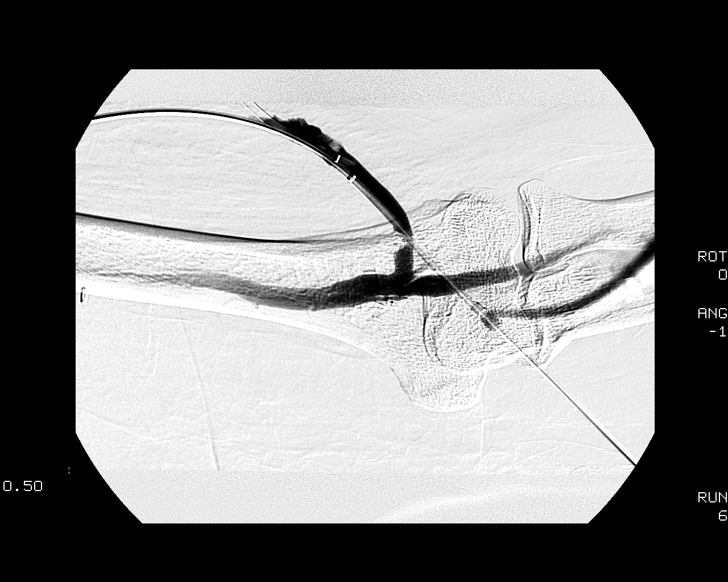
[im 12/56]
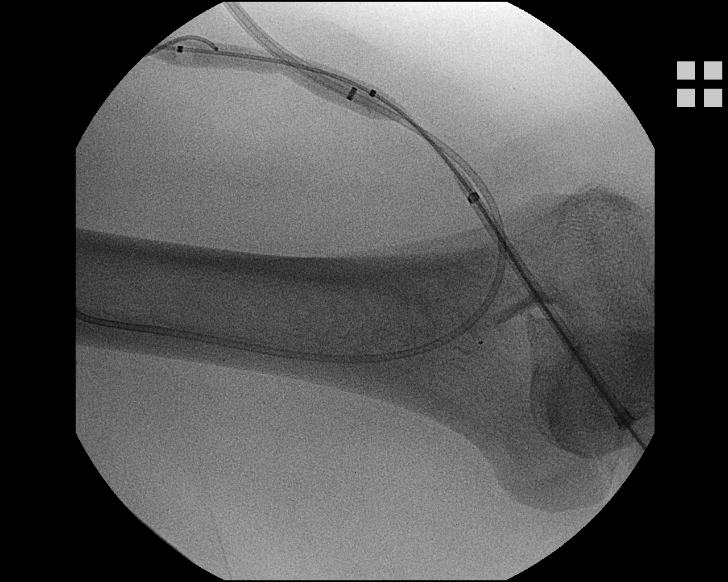
[im 17/56]
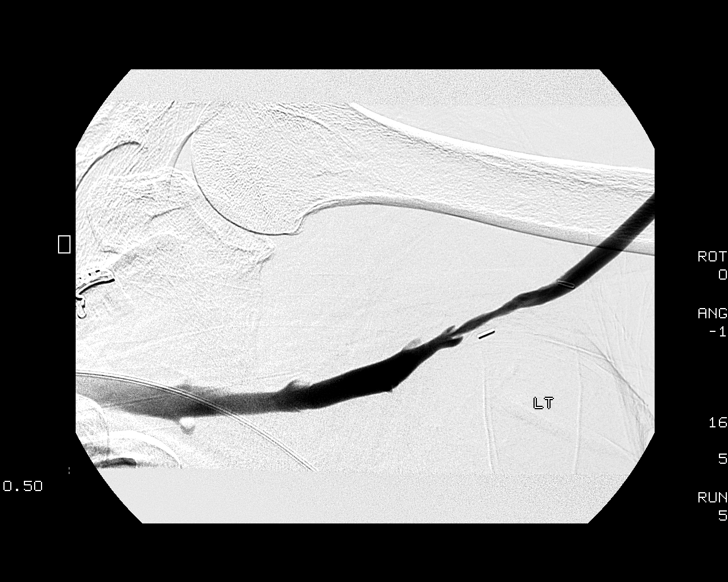
[im 22/56]
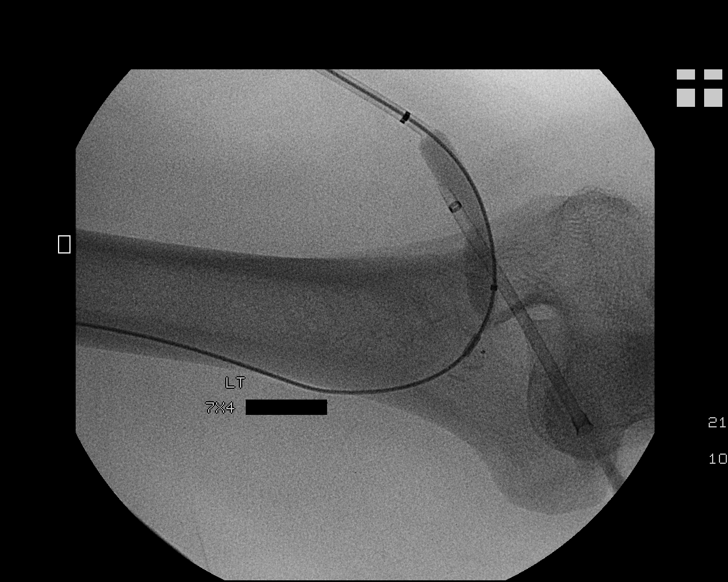
[im 27/56]
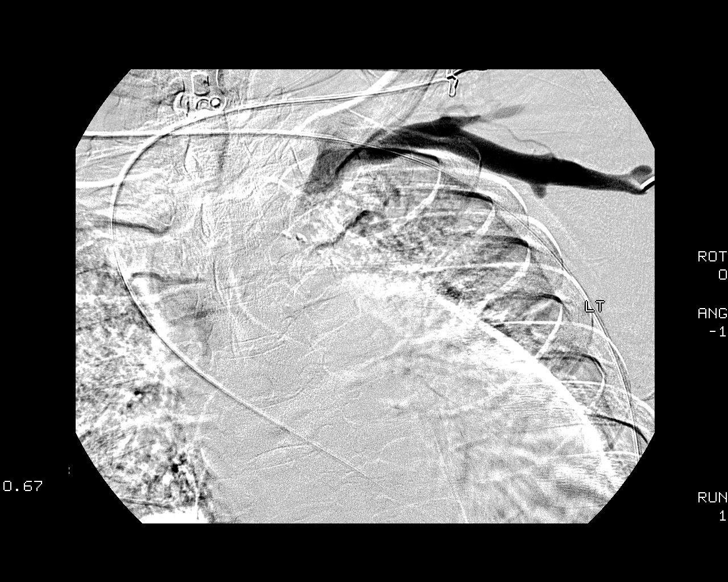
[im 32/56]
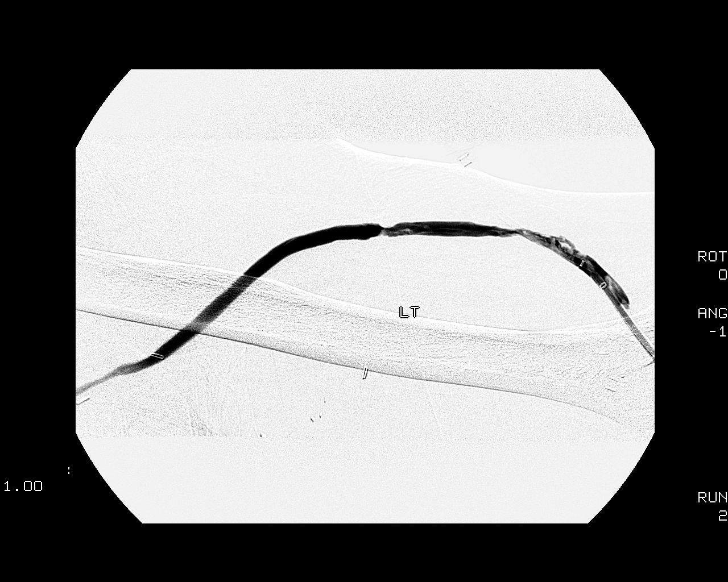
[im 36/56]
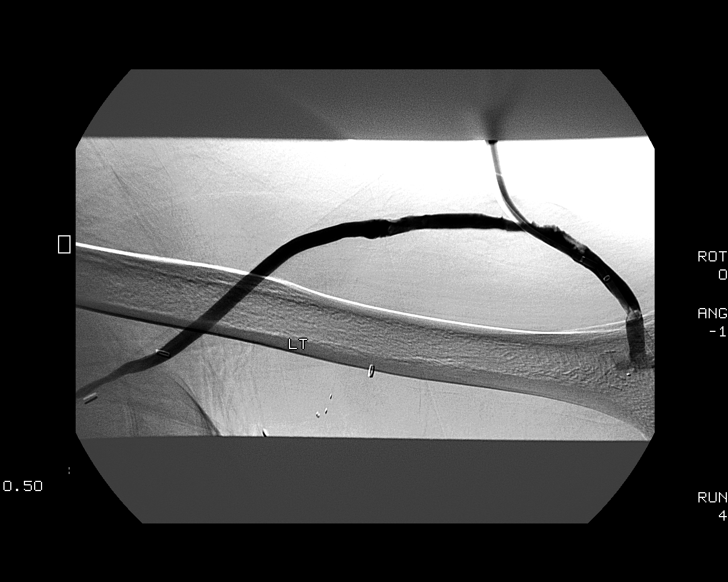
[im 41/56]
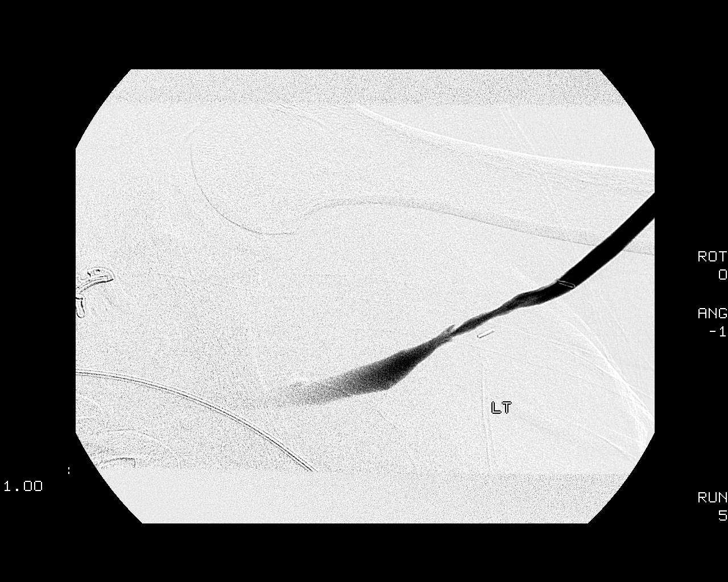
[im 46/56]
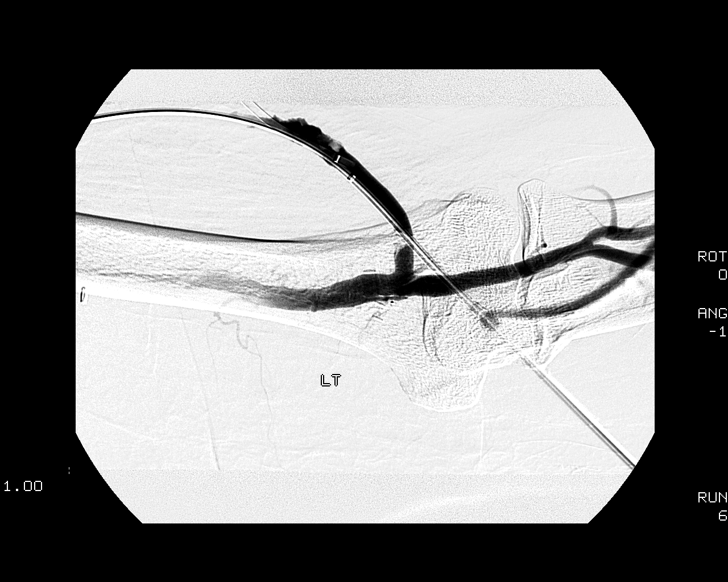
[im 51/56]
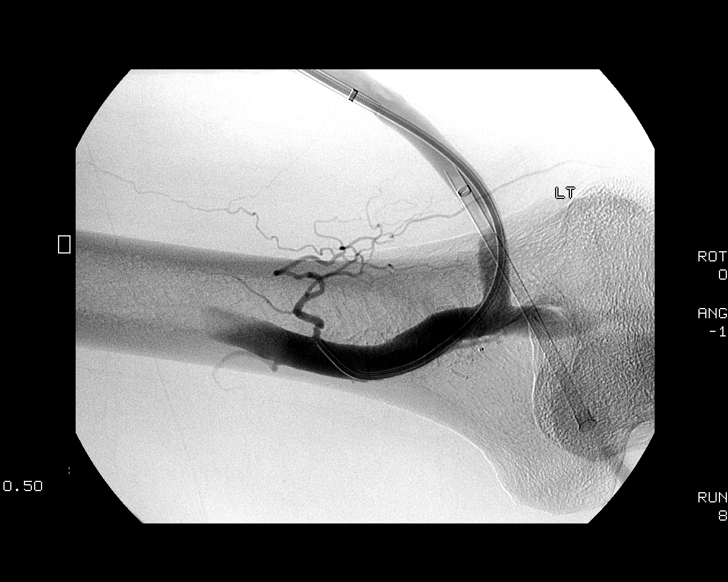
[im 56/56]
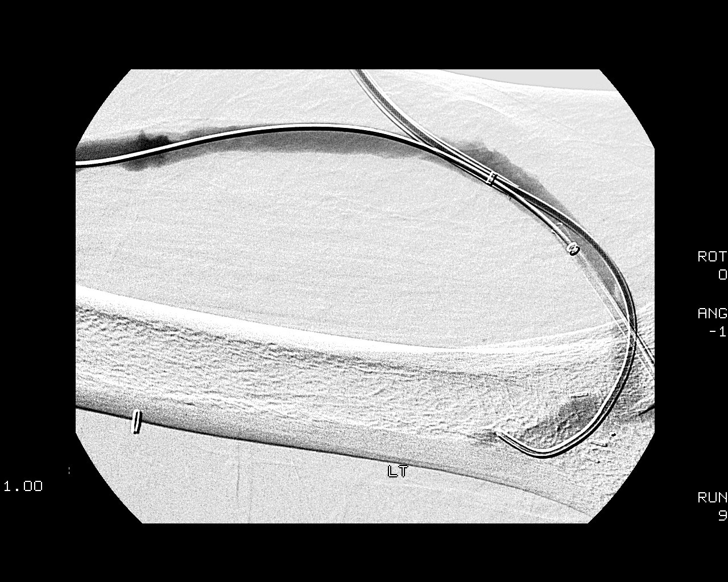

[12 of 24 positions shown; findings below may reference images not displayed]

Intravenous Fentanyl and Versed were administered as conscious
sedation during continuous cardiorespiratory monitoring by the
radiology RN, with a total moderate sedation time of 35 minutes.

 The graft just central to the arterial anastomosis was accessed
antegrade with a 21-gauge micropuncture needle under real-time
ultrasonic guidance after the overlying skin prepped with Betadine,
draped in usual sterile fashion, infiltrated locally with 1%
lidocaine. Needle exchanged over 018 guidewire for transitional
dilator through which 2 mg t-PA was administered. Ultrasound
imaging documentation was saved. Through the   dilator, a Bentson
wire was advanced to the venous anastomosis. Over this a 6F sheath
was placed, through which a 5 French Kumpe catheter was advanced
for outflow venography. This showed patency of the outflow  venous
system through the SVC. 6000 units heparin were administered. The
Arrow PTD device was used to macerate thrombus in the graft.

 In similar fashion, the more central aspect of the graft was
accessed retrograde under ultrasound with a micropuncture needle,
exchanged for a transitional dilator. The venous limb dilator was
exchanged in similar fashion for a 6 French vascular sheath. The
PTD device was advanced across the arterial anastomosis and used to
dislodge the platelet plug  into the arterial limb of the graft,
where it was macerated. The PTD device was used again to remove any
residual platelet plug from the arterial anastomosis. Injection
showed good antegrade flow through the graft with near complete
removal of the thrombus.  A short segment moderate recurrent venous
anastomotic stenosis was identified.  Due to the precipitous
recurrence of this lesion, a 8 mm x 40 mm covered Flair stent was
deployed across the recurrent venous anastomotic stenosis, dilated
with a 7 mm x 4 cm Conquest angioplasty balloon  with good
response.  Balloon was removed and injection showed good position
of the stent with no significant residual/recurrent stenosis,
extravasation, dissection, or other apparent complication.

Reflux across the arterial anastomosis demonstrate this to be
widely patent, with unremarkable appearance of the visualized
native arterial circulation. Some residual eccentric thrombus or
debris was noted in the graft central to the arterial anastomosis.
This was macerated with a 7 mm balloon and follow-up by shuntogram
shows good flow with no significant residual occlusive debris.  The
native arterial circulation remains widely patent.  The catheter
and sheaths were then removed and hemostasis achieved with 2-0
Ethilon sutures. Patient tolerated procedure well.

IMPRESSION

1. Technically successful declot of left upper arm straight
synthetic   hemodialysis graft.

2. Technically successful covered stent assisted balloon
angioplasty of recurrent venous anastomotic stenosis.

Access management: Remains approachable for percutaneous
intervention as needed.

## 2013-10-05 IMAGING — US IR US GUIDE VASC ACCESS RIGHT
1 series · 1 of 1 positions shown · non-contrast
Comparison: none

Clinical Data/Indication: RENAL FAILURE

TUNNELED DIALYSIS CATHETER PLACEMENT, ULTRASOUND GUIDANCE FOR
VASCULAR ACCESS
Sedation: Versed one mg, Fentanyl 15 mcg.
Total Moderate Sedation Time: 20 minutes.
Vancomycin was given within two hours of incision.  Vancomycin was
given due to an antibiotic allergy. Fluoroscopy Time: 1.00 minutes.
Procedure: The procedure, risks, benefits, and alternatives were
explained to the patient. Questions regarding the procedure were
encouraged and answered. The patient understands and consents to
the procedure.
The right neck was prepped with betadine in a sterile fashion, and
a sterile drape was applied covering the operative field. A sterile
gown and sterile gloves were used for the procedure. 1% lidocaine
into the skin and subcutaneous tissue.  The right internal jugular
vein was noted to be patent initially with ultrasound.  Under
sonographic guidance, a micropuncture needle was inserted into the
right IJ vein (Ultrasound and fluoroscopic image documentation was
performed). It was removed over an 018 wire which was upsized to an
Amplatz.  This was advanced into the IVC.
A small incision was made in the right upper chest.  The tunneling
device was utilized to advance the 23 centimeter tip to cuff
catheter from the chest incision and out the neck incision.  A peel-
away sheath was advanced over the Amplatz wire.  The leading edge
of the catheter was then advanced through the peel-away sheath.
The peel-away sheath was removed.  It was flushed and instilled
with heparin.  The chest incision was closed with a 0 Prolene
pursestring stitch.  The neck incision was closed with a 4-0 Vicryl
subcuticular stitch.  No complications.

[Series 1: ir us guide vasc access right · 1 of 1 slices shown]
[im 1/1]
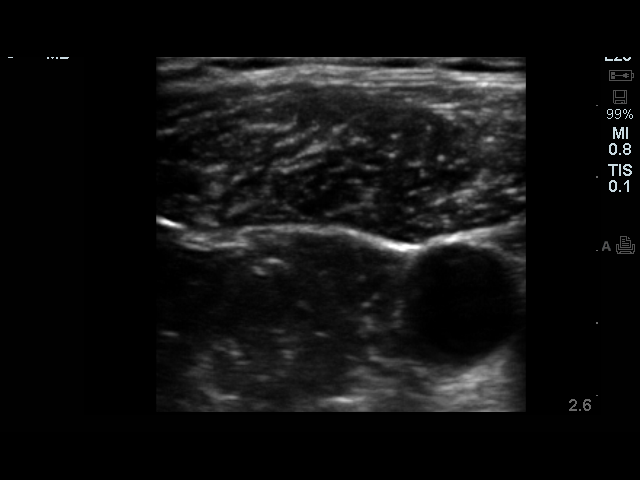

[1 of 1 positions shown; findings below may reference images not displayed]

FINDINGS: The image demonstrates placement of a tunneled dialysis
catheter with its tip in the right atrium.
IMPRESSION: Successful right IJ vein tunneled dialysis catheter with its tip in
the right atrium.

## 2013-10-14 ENCOUNTER — Ambulatory Visit (INDEPENDENT_AMBULATORY_CARE_PROVIDER_SITE_OTHER): Payer: Medicare HMO | Admitting: Internal Medicine

## 2013-10-14 ENCOUNTER — Encounter: Payer: Self-pay | Admitting: Internal Medicine

## 2013-10-14 VITALS — BP 132/80 | HR 86 | Temp 98.2°F | Ht 73.0 in | Wt 240.5 lb

## 2013-10-14 DIAGNOSIS — B9789 Other viral agents as the cause of diseases classified elsewhere: Secondary | ICD-10-CM

## 2013-10-14 DIAGNOSIS — B349 Viral infection, unspecified: Secondary | ICD-10-CM | POA: Insufficient documentation

## 2013-10-14 DIAGNOSIS — R42 Dizziness and giddiness: Secondary | ICD-10-CM

## 2013-10-14 DIAGNOSIS — I509 Heart failure, unspecified: Secondary | ICD-10-CM

## 2013-10-14 DIAGNOSIS — R197 Diarrhea, unspecified: Secondary | ICD-10-CM | POA: Insufficient documentation

## 2013-10-14 DIAGNOSIS — E118 Type 2 diabetes mellitus with unspecified complications: Secondary | ICD-10-CM

## 2013-10-14 DIAGNOSIS — E1165 Type 2 diabetes mellitus with hyperglycemia: Secondary | ICD-10-CM

## 2013-10-14 DIAGNOSIS — IMO0002 Reserved for concepts with insufficient information to code with codable children: Secondary | ICD-10-CM

## 2013-10-14 DIAGNOSIS — M2550 Pain in unspecified joint: Secondary | ICD-10-CM

## 2013-10-14 MED ORDER — MECLIZINE HCL 12.5 MG PO TABS: 12.5000 mg | ORAL_TABLET | Freq: Three times a day (TID) | ORAL | Status: DC | PRN

## 2013-10-14 MED ORDER — ONDANSETRON HCL 4 MG PO TABS: 4.0000 mg | ORAL_TABLET | Freq: Three times a day (TID) | ORAL | Status: DC | PRN

## 2013-10-14 MED ORDER — OSELTAMIVIR PHOSPHATE 75 MG PO CAPS: 75.0000 mg | ORAL_CAPSULE | Freq: Two times a day (BID) | ORAL | Status: DC

## 2013-10-14 NOTE — Patient Instructions (Addendum)
Please take all new medication as prescribed  - the tamiflu antibiotic, meclizine for dizziness, and zofran for nausea Please continue all other medications as before, and refills have been done if requested.  Please have the pharmacy call with any other refills you may need.  Please keep your appointments with your specialists as you have planned  - renal, as well as cardiology and podiatry as discussed at your visit, and Dr Loanne Drilling for DM, and Dr Percell Miller for the joint pain

## 2013-10-14 NOTE — Assessment & Plan Note (Signed)
Exam benign, no abd pain or blood, doubt cdiff, to consider stool study and cbc if symptoms persist or worsen

## 2013-10-14 NOTE — Assessment & Plan Note (Signed)
High suspicion for prob influenza like illness, for fluids, tylenol, tamiflu course.  to f/u any worsening symptoms or concerns

## 2013-10-14 NOTE — Progress Notes (Signed)
Subjective:    Patient ID: [name], male    DOB: October 11, 1950, 63 y.o.   MRN: [id number]  HPI  Here with wife, with 2-3 days onset feverish, chills, myalgias, HA, general weakness and malaise, pressure about the eyes, HA, slight ST, non prod cough, several loose and watery stools without abd pain, n/v.  Wife concerned about recurrent cdiff, no blood.  Cont's to have positional vertigo, better with the meclizine, Pt denies chest pain, increased sob or doe, wheezing, orthopnea, PND, increased LE swelling, palpitations, dizziness or syncope. Pt denies new neurological symptoms such as new headache, or facial or extremity weakness or numbness  Dementia overall stable symptomatically, and not assoc with behavioral changes such as hallucinations, paranoia, or agitation.  Needs mult referral due to insurance change Past Medical History  Diagnosis Date  . Hyperthyroidism   . GASTROENTERITIS, VIRAL 10/14/2009  . ONYCHOMYCOSIS, TOENAILS 12/26/2007  . GOITER, MULTINODULAR 12/26/2007  . HYPERTHYROIDISM 02/02/2010  . DIABETES MELLITUS, TYPE II 02/01/2010  . DYSLIPIDEMIA 06/18/2007  . GOUT 06/18/2007  . Hypocalcemia 06/07/2010  . DEMENTIA 09/02/2009  . DEPRESSION 10/14/2009  . PERIPHERAL NEUROPATHY 06/18/2007  . HYPERTENSION 06/18/2007  . CONGESTIVE HEART FAILURE 06/18/2007  . DIASTOLIC HEART FAILURE, CHRONIC 02/06/2009  . Unspecified hypotension 01/30/2010  . PULMONARY NODULE, RIGHT LOWER LOBE 06/08/2009  . GERD 06/18/2007  . ESRD 08/04/2010  . BENIGN PROSTATIC HYPERTROPHY 10/14/2009  . GYNECOMASTIA 07/17/2010  . NECK PAIN 07/31/2010  . FOOT PAIN 08/12/2008  . DIZZINESS 07/17/2010  . Other malaise and fatigue 11/24/2009  . GAIT DISTURBANCE 03/03/2010  . DYSPNEA 10/29/2008  . CHEST PAIN 03/29/2010  . TRANSAMINASES, SERUM, ELEVATED 02/01/2010  . COLONIC POLYPS, HX OF 10/14/2009  . Anemia 06/16/2011  . Ischemic cardiomyopathy 06/16/2011  . Hyperparathyroidism, secondary 06/16/2011  . OSA on CPAP 10/16/2011  .  Hyperlipidemia 10/16/2011  . CAD, NATIVE VESSEL 02/06/2009    saw Dr. Missy Sabins last jan 2013  . Sleep apnea     cpap machine and o2   Past Surgical History  Procedure Laterality Date  . Stent 06/11/08    . Neck sugury  2/09  . Back surgury  1998  . Givens capsule study  09/29/2011    Procedure: GIVENS CAPSULE STUDY;  Surgeon: Missy Sabins, MD;  Location: [facility];  Service: Endoscopy;  Laterality: Left;  . Esophagogastroduodenoscopy  09/28/2011    Procedure: ESOPHAGOGASTRODUODENOSCOPY (EGD);  Surgeon: Missy Sabins, MD;  Location: [facility] ENDOSCOPY;  Service: Endoscopy;  Laterality: N/A;  . Givens capsule study  09/30/2011    Procedure: GIVENS CAPSULE STUDY;  Surgeon: Jeryl Columbia, MD;  Location: [facility] ENDOSCOPY;  Service: Endoscopy;  Laterality: N/A;  . Cholecystectomy    . Cardiac catheterization    . Tonsillectomy    . Av fistula placement  11/07/2011    Procedure: INSERTION OF ARTERIOVENOUS (AV) GORE-TEX GRAFT ARM;  Surgeon: Tinnie Gens, MD;  Location: [facility];  Service: Vascular;  Laterality: Left;  . Dialysis fistula creation  2009    right  . Insertion of dialysis catheter  2014    right side  . Insertion of dialysis catheter Left 02/11/2013    Procedure: INSERTION OF DIALYSIS CATHETER;  Surgeon: Conrad [place], MD;  Location: [place];  Service: Vascular;  Laterality: Left;  Ultrasound guided  . Removal of a dialysis catheter Right 02/11/2013    Procedure: REMOVAL OF A DIALYSIS CATHETER;  Surgeon: Conrad McLoud, MD;  Location: [place];  Service: Vascular;  Laterality:  Right;  . Bascilic vein transposition Right 02/27/2013    Procedure: BASCILIC VEIN TRANSPOSITION;  Surgeon: Mal Misty, MD;  Location: [place];  Service: Vascular;  Laterality: Right;  Right Basilic Vein Transposition     reports that he has quit smoking. His smoking use included Cigarettes. He has a 25 pack-year smoking history. He has never used smokeless tobacco. He reports that he drinks alcohol. He reports that he does not use  illicit drugs. family history includes Heart disease in his father and sister. Allergies  Allergen Reactions  . Cephalexin Other (See Comments)    Tongue swelling  . Statins     Weak muscles   Current Outpatient Prescriptions on File Prior to Visit  Medication Sig Dispense Refill  . allopurinol (ZYLOPRIM) 100 MG tablet Take 100 mg by mouth daily.       [name] amitriptyline (ELAVIL) 50 MG tablet Take 50 mg by mouth daily as needed (rest).       [name] amLODipine (NORVASC) 10 MG tablet Take 10 mg by mouth daily.      [name] aspirin 81 MG EC tablet Take 81 mg by mouth daily.       . calcium-vitamin D (OSCAL WITH D) 500-200 MG-UNIT per tablet Take 3 tablets by mouth 3 (three) times daily with meals.       . carvedilol (COREG) 25 MG tablet Take 0.5 tablets (12.5 mg total) by mouth 2 (two) times daily with a meal.  30 tablet  6  . cinacalcet (SENSIPAR) 60 MG tablet Take 60 mg by mouth 2 (two) times daily.       . clomiPHENE (CLOMID) 50 MG tablet 1/4 tab daily  10 tablet  11  . colesevelam (WELCHOL) 625 MG tablet Take 3 tablets (1,875 mg total) by mouth 2 (two) times daily with a meal.  540 tablet  3  . gabapentin (NEURONTIN) 300 MG capsule Take 1 capsule (300 mg total) by mouth at bedtime as needed. For pain  90 capsule  1  . glucose blood (FREESTYLE TEST STRIPS) test strip 1 each by Other route daily. And lancets 1/day 250.40  100 each  12  . lanthanum (FOSRENOL) 1000 MG chewable tablet Chew 1,000 mg by mouth 2 times daily at 12 noon and 4 pm.      . multivitamin (RENA-VIT) TABS tablet Take 1 tablet by mouth daily.        . Omega-3 Fatty Acids (FISH OIL) 1000 MG CAPS Take 1 capsule by mouth daily as needed (for vitamin).       [name] oxyCODONE (ROXICODONE) 5 MG immediate release tablet Take 1 tablet (5 mg total) by mouth every 4 (four) hours as needed for pain.  30 tablet  0  . ranitidine (ZANTAC) 300 MG tablet Take 300 mg by mouth at bedtime.       No current facility-administered medications on file prior to  visit.   Review of Systems  Constitutional: Negative for unexpected weight change, or unusual diaphoresis  HENT: Negative for tinnitus.   Eyes: Negative for photophobia and visual disturbance.  Respiratory: Negative for choking and stridor.   Gastrointestinal: Negative for vomiting and blood in stool.  Genitourinary: Negative for hematuria and decreased urine volume.  Musculoskeletal: Negative for acute joint swelling Skin: Negative for color change and wound.  Neurological: Negative for tremors and numbness other than noted  Psychiatric/Behavioral: Negative for decreased concentration or  hyperactivity.       Objective:   Physical Exam BP 132/80  Pulse 86  Temp(Src) 98.2 F (36.8 C) (Oral)  Ht 6\' 1"  (1.854 m)  Wt 240 lb 8 oz (109.09 kg)  BMI 31.74 kg/m2  SpO2 95% VS noted, mild ill, Constitutional: Pt appears well-developed and well-nourished.  HENT: Head: NCAT.  Right Ear: External ear normal.  Left Ear: External ear normal.  Bilat tm's with mild erythema.  Max sinus areas non tender.  Pharynx with mild erythema, no exudate Eyes: Conjunctivae and EOM are normal. Pupils are equal, round, and reactive to light.  Neck: Normal range of motion. Neck supple.  Cardiovascular: Normal rate and regular rhythm.   Pulmonary/Chest: Effort normal and breath sounds normal.  Abd:  Soft, NT, non-distended, + BS - benign exam Neurological: Pt is alert. At baseline confusion Skin: Skin is warm. No erythema.  Psychiatric: Pt behavior is normal. Not agitated.     Assessment & Plan:

## 2013-10-14 NOTE — Progress Notes (Signed)
Pre-visit discussion using our clinic review tool. No additional management support is needed unless otherwise documented below in the visit note.  

## 2013-10-14 NOTE — Assessment & Plan Note (Signed)
To re-start meclizine prn, .also anti-emetic prn,  to f/u any worsening symptoms or concerns

## 2013-10-15 ENCOUNTER — Telehealth: Payer: Self-pay

## 2013-10-15 ENCOUNTER — Telehealth: Payer: Self-pay | Admitting: *Deleted

## 2013-10-15 NOTE — Telephone Encounter (Signed)
Spouse phoned this morning & stated the Tamiflu PCP ordered yesterday for pt costs over $120 & needs cheaper script.  Please advise.   CB# [phone]

## 2013-10-15 NOTE — Telephone Encounter (Signed)
Relevant patient education mailed to patient.  

## 2013-10-15 NOTE — Telephone Encounter (Signed)
Spouse phoned at [phone], [phone] and [phone]. MD's response and recommendations were relayed to her VERBATIM.  She verbalized understanding, after recommendations were reiterated for approximately 12 minutes.

## 2013-10-15 NOTE — Telephone Encounter (Signed)
I believe this is the Brand name price, and the pharmacy may have been out of the generic.  Does the pt have an alternative pharmacy? If so, she should to "shop" for the generic price, as most pt's do not pay $120 for generic

## 2013-10-28 ENCOUNTER — Ambulatory Visit (HOSPITAL_COMMUNITY)
Admission: RE | Admit: 2013-10-28 | Discharge: 2013-10-28 | Disposition: A | Discharge status: Home / Self Care | Payer: Medicare HMO | Source: Ambulatory Visit | Attending: Internal Medicine | Admitting: Internal Medicine

## 2013-10-28 ENCOUNTER — Encounter (HOSPITAL_COMMUNITY): Payer: Self-pay

## 2013-10-28 VITALS — BP 173/93 | HR 73 | Resp 18 | Wt 242.5 lb

## 2013-10-28 DIAGNOSIS — Z79899 Other long term (current) drug therapy: Secondary | ICD-10-CM | POA: Insufficient documentation

## 2013-10-28 DIAGNOSIS — Z7982 Long term (current) use of aspirin: Secondary | ICD-10-CM | POA: Insufficient documentation

## 2013-10-28 DIAGNOSIS — N186 End stage renal disease: Secondary | ICD-10-CM | POA: Insufficient documentation

## 2013-10-28 DIAGNOSIS — I120 Hypertensive chronic kidney disease with stage 5 chronic kidney disease or end stage renal disease: Secondary | ICD-10-CM | POA: Insufficient documentation

## 2013-10-28 DIAGNOSIS — M1990 Unspecified osteoarthritis, unspecified site: Secondary | ICD-10-CM | POA: Insufficient documentation

## 2013-10-28 DIAGNOSIS — G4733 Obstructive sleep apnea (adult) (pediatric): Secondary | ICD-10-CM | POA: Insufficient documentation

## 2013-10-28 DIAGNOSIS — I10 Essential (primary) hypertension: Secondary | ICD-10-CM

## 2013-10-28 DIAGNOSIS — I5022 Chronic systolic (congestive) heart failure: Secondary | ICD-10-CM | POA: Insufficient documentation

## 2013-10-28 DIAGNOSIS — E785 Hyperlipidemia, unspecified: Secondary | ICD-10-CM | POA: Insufficient documentation

## 2013-10-28 DIAGNOSIS — R51 Headache: Secondary | ICD-10-CM | POA: Insufficient documentation

## 2013-10-28 DIAGNOSIS — I509 Heart failure, unspecified: Secondary | ICD-10-CM | POA: Insufficient documentation

## 2013-10-28 DIAGNOSIS — J449 Chronic obstructive pulmonary disease, unspecified: Secondary | ICD-10-CM | POA: Insufficient documentation

## 2013-10-28 DIAGNOSIS — E669 Obesity, unspecified: Secondary | ICD-10-CM | POA: Insufficient documentation

## 2013-10-28 DIAGNOSIS — K219 Gastro-esophageal reflux disease without esophagitis: Secondary | ICD-10-CM | POA: Insufficient documentation

## 2013-10-28 DIAGNOSIS — J4489 Other specified chronic obstructive pulmonary disease: Secondary | ICD-10-CM | POA: Insufficient documentation

## 2013-10-28 DIAGNOSIS — I428 Other cardiomyopathies: Secondary | ICD-10-CM | POA: Insufficient documentation

## 2013-10-28 DIAGNOSIS — E119 Type 2 diabetes mellitus without complications: Secondary | ICD-10-CM | POA: Insufficient documentation

## 2013-10-28 NOTE — Addendum Note (Signed)
Encounter addended by: Scarlette Calico, RN on: 10/28/2013 11:27 AM<BR>     Documentation filed: Patient Instructions Section

## 2013-10-28 NOTE — Patient Instructions (Addendum)
We will contact you in 6 months to schedule your next appointment.  

## 2013-10-28 NOTE — Progress Notes (Signed)
Patient ID: [name], male   DOB: [birth year], 63 y.o.   MRN: [id number]  PCP: Dr [name] Nephrologist: Dr Mercy Moore  HPI: [name] is 63 year old male with PMH: obesity, DM, COPD on nighttime oxygen, sleep apnea on CPAP, ESRD- HD, CAD, S/P Taxus drug-eluting stent to the right coronary in September [id number], chronic systolic/diastolic heart failure ECHO 09/2011 EF 35-40% grade 2, KL (Unable to tolerate statins so placed on Welchol)  Had RHC in 2/11 which showed low pressures (PCWP = 2) , PA 26/16 (22)  and normal cardiac output.  Echo 12/12: EF 55% Myoview 10/15/12: EF 33%. LV Wall Motion: There is global hypokinesis. The LV is markedly enlarged. Small fixed apical defect.  cMRI 02/19/13: EF 37% dilated LV. diffuse HK  Evaluated at [facility] for kidney transplant but he was not felt to be candidate (11/2012) due to poor mobility, DM, and coronary disease.   CPX 06/04/13   Resting HR: 76 Peak HR: 109 (70% age predicted max HR) BP rest: 107/70 BP peak: 154/69 Peak VO2: 11.8 (53.7% predicted peak VO2) - when corrected to ibw pVO2 15.7 VE/VCO2 slope: 28.8 OUES: 1.90 Peak RER: 1.06  Ve/MVV 34.7%  Follow up: Last visit cut coreg back to 12.5 mg BID however he has cut back to 12.5 mg once a day. Says he has felt real bad lately. Complains of chills/dizziness/fatigue. He was evaluated by Dr [name] 10/14/13 and he thought he had the flu. He was prescribed Tamiflu but pharmacy felt he was too far out and it wasn't filled. He was also started on prn meclizine for dizziness. Says he is dizzy in bed when he lies down or sits up.Had dialysis yesterday but his fistula is sluggish. Had fistula revision this am. Dry weight 111.4.5 kg . HD on Tues, Thur, Sat.[name]  He did not have medications this am because he was evaluated by vascular. Edema managed with HD.    Past Medical History  Diagnosis Date  . Hyperthyroidism   . GASTROENTERITIS, VIRAL 10/14/2009  . ONYCHOMYCOSIS, TOENAILS 12/26/2007  . GOITER, MULTINODULAR  12/26/2007  . HYPERTHYROIDISM 02/02/2010  . DIABETES MELLITUS, TYPE II 02/01/2010  . DYSLIPIDEMIA 06/18/2007  . GOUT 06/18/2007  . Hypocalcemia 06/07/2010  . DEMENTIA 09/02/2009  . DEPRESSION 10/14/2009  . PERIPHERAL NEUROPATHY 06/18/2007  . HYPERTENSION 06/18/2007  . CONGESTIVE HEART FAILURE 06/18/2007  . DIASTOLIC HEART FAILURE, CHRONIC 02/06/2009  . Unspecified hypotension 01/30/2010  . PULMONARY NODULE, RIGHT LOWER LOBE 06/08/2009  . GERD 06/18/2007  . ESRD 08/04/2010  . BENIGN PROSTATIC HYPERTROPHY 10/14/2009  . GYNECOMASTIA 07/17/2010  . NECK PAIN 07/31/2010  . FOOT PAIN 08/12/2008  . DIZZINESS 07/17/2010  . Other malaise and fatigue 11/24/2009  . GAIT DISTURBANCE 03/03/2010  . DYSPNEA 10/29/2008  . CHEST PAIN 03/29/2010  . TRANSAMINASES, SERUM, ELEVATED 02/01/2010  . COLONIC POLYPS, HX OF 10/14/2009  . Anemia 06/16/2011  . Ischemic cardiomyopathy 06/16/2011  . Hyperparathyroidism, secondary 06/16/2011  . OSA on CPAP 10/16/2011  . Hyperlipidemia 10/16/2011  . CAD, NATIVE VESSEL 02/06/2009    saw Dr. Missy Sabins last jan 2013  . Sleep apnea     cpap machine and o2    Current Outpatient Prescriptions  Medication Sig Dispense Refill  . allopurinol (ZYLOPRIM) 100 MG tablet Take 100 mg by mouth daily.       [name] amitriptyline (ELAVIL) 50 MG tablet Take 50 mg by mouth daily as needed (rest).       [name] amLODipine (NORVASC) 10 MG tablet Take 10 mg by mouth  daily.      . aspirin 81 MG EC tablet Take 81 mg by mouth daily.       . calcium-vitamin D (OSCAL WITH D) 500-200 MG-UNIT per tablet Take 3 tablets by mouth 3 (three) times daily with meals.       . carvedilol (COREG) 25 MG tablet Take 12.5 mg by mouth daily.      . cinacalcet (SENSIPAR) 60 MG tablet Take 60 mg by mouth 2 (two) times daily.       . clomiPHENE (CLOMID) 50 MG tablet 1/4 tab daily  10 tablet  11  . colesevelam (WELCHOL) 625 MG tablet Take 3 tablets (1,875 mg total) by mouth 2 (two) times daily with a meal.  540 tablet  3  . gabapentin (NEURONTIN)  300 MG capsule Take 1 capsule (300 mg total) by mouth at bedtime as needed. For pain  90 capsule  1  . glucose blood (FREESTYLE TEST STRIPS) test strip 1 each by Other route daily. And lancets 1/day 250.40  100 each  12  . lanthanum (FOSRENOL) 1000 MG chewable tablet Chew 1,000 mg by mouth 2 times daily at 12 noon and 4 pm.      . meclizine (ANTIVERT) 12.5 MG tablet Take 1 tablet (12.5 mg total) by mouth 3 (three) times daily as needed for dizziness.  30 tablet  1  . multivitamin (RENA-VIT) TABS tablet Take 1 tablet by mouth daily.        . Omega-3 Fatty Acids (FISH OIL) 1000 MG CAPS Take 1 capsule by mouth daily as needed (for vitamin).       . ondansetron (ZOFRAN) 4 MG tablet Take 1 tablet (4 mg total) by mouth every 8 (eight) hours as needed for nausea or vomiting.  30 tablet  0  . oxyCODONE (ROXICODONE) 5 MG immediate release tablet Take 1 tablet (5 mg total) by mouth every 4 (four) hours as needed for pain.  30 tablet  0  . ranitidine (ZANTAC) 300 MG tablet Take 300 mg by mouth at bedtime.       No current facility-administered medications for this encounter.    Filed Vitals:   10/28/13 1040  BP: 173/93  Pulse: 73  Resp: 18    PHYSICAL EXAM: General:  Fatigued appearing. No resp difficulty Wife present HEENT: normal Neck: supple. JVP flat. Carotids 2+ bilaterally; no bruits. No lymphadenopathy or thryomegaly appreciated. Cor: PMI normal. Regular rate & rhythm. No rubs, gallops 2/6 SEM LUSB. L chest perm-cath Lungs: clear Abdomen: obese  soft, nontender, nondistended. No hepatosplenomegaly. No bruits or masses. Good bowel sounds. Extremities: no cyanosis, clubbing, rash, edema. + grafts in both UEs  Neuro: alert & orientedx3, cranial nerves grossly intact. Moves all 4 extremities w/o difficulty. Affect pleasant.   ASSESSMENT & PLAN:  1. Chronic Systolic Heart Fatigue:  NICM, EF 37% per c-MRI. NYHA IIIB.  Volume status stable with HD. Due to profound fatigue continue carvedilol  12.5 mg bid except hold on am on HD.   Would like to get him in cardiac rehab after he improves.  Will get labs at HD  2. ESRD-  Dry Weight 111.5 kg HD Tues/Thur/Sat  3. Headaches- F/U with opthamologist   4. Osteoarthritis Hold off on knee replacement for now.   5. HTN - can add hydralazine/nitrates as needed for HTN. Will leave to the renal team to adjust BP meds around HD.   Follow up in 1 month  [name],[name] NP-C   Patient seen and  examined with Darrick Grinder, NP. We discussed all aspects of the encounter. I agree with the assessment and plan as stated above.   Very stable from HF perspective. Volume status well controlled with HD. Will leave BP management to Renal team - with reduced EF can use hydralazine/nitrates as needed for HTN.   With recent infectious symptoms have suggested he gets blood drawn for labs tomorrow in HD.  Daniel Bensimhon,MD 11:21 AM

## 2013-11-02 ENCOUNTER — Institutional Professional Consult (permissible substitution): Payer: Medicare HMO | Admitting: Interventional Cardiology

## 2013-11-04 ENCOUNTER — Telehealth: Payer: Self-pay | Admitting: Internal Medicine

## 2013-11-04 DIAGNOSIS — N19 Unspecified kidney failure: Secondary | ICD-10-CM

## 2013-11-04 NOTE — Telephone Encounter (Signed)
11/04/2013  Pt is needing a referral to the kidney doctor, Dr. Fleet Contras, MD, [facility].

## 2013-11-04 NOTE — Telephone Encounter (Signed)
Done  Per emr

## 2013-12-03 IMAGING — CR DG CHEST 1V PORT
1 series · 1 of 1 positions shown · non-contrast
Comparison: 02/11/2013 at 9295 hours

CLINICAL DATA: Postop Port-A-Cath placement

PORTABLE CHEST - 1 VIEW

[AP]
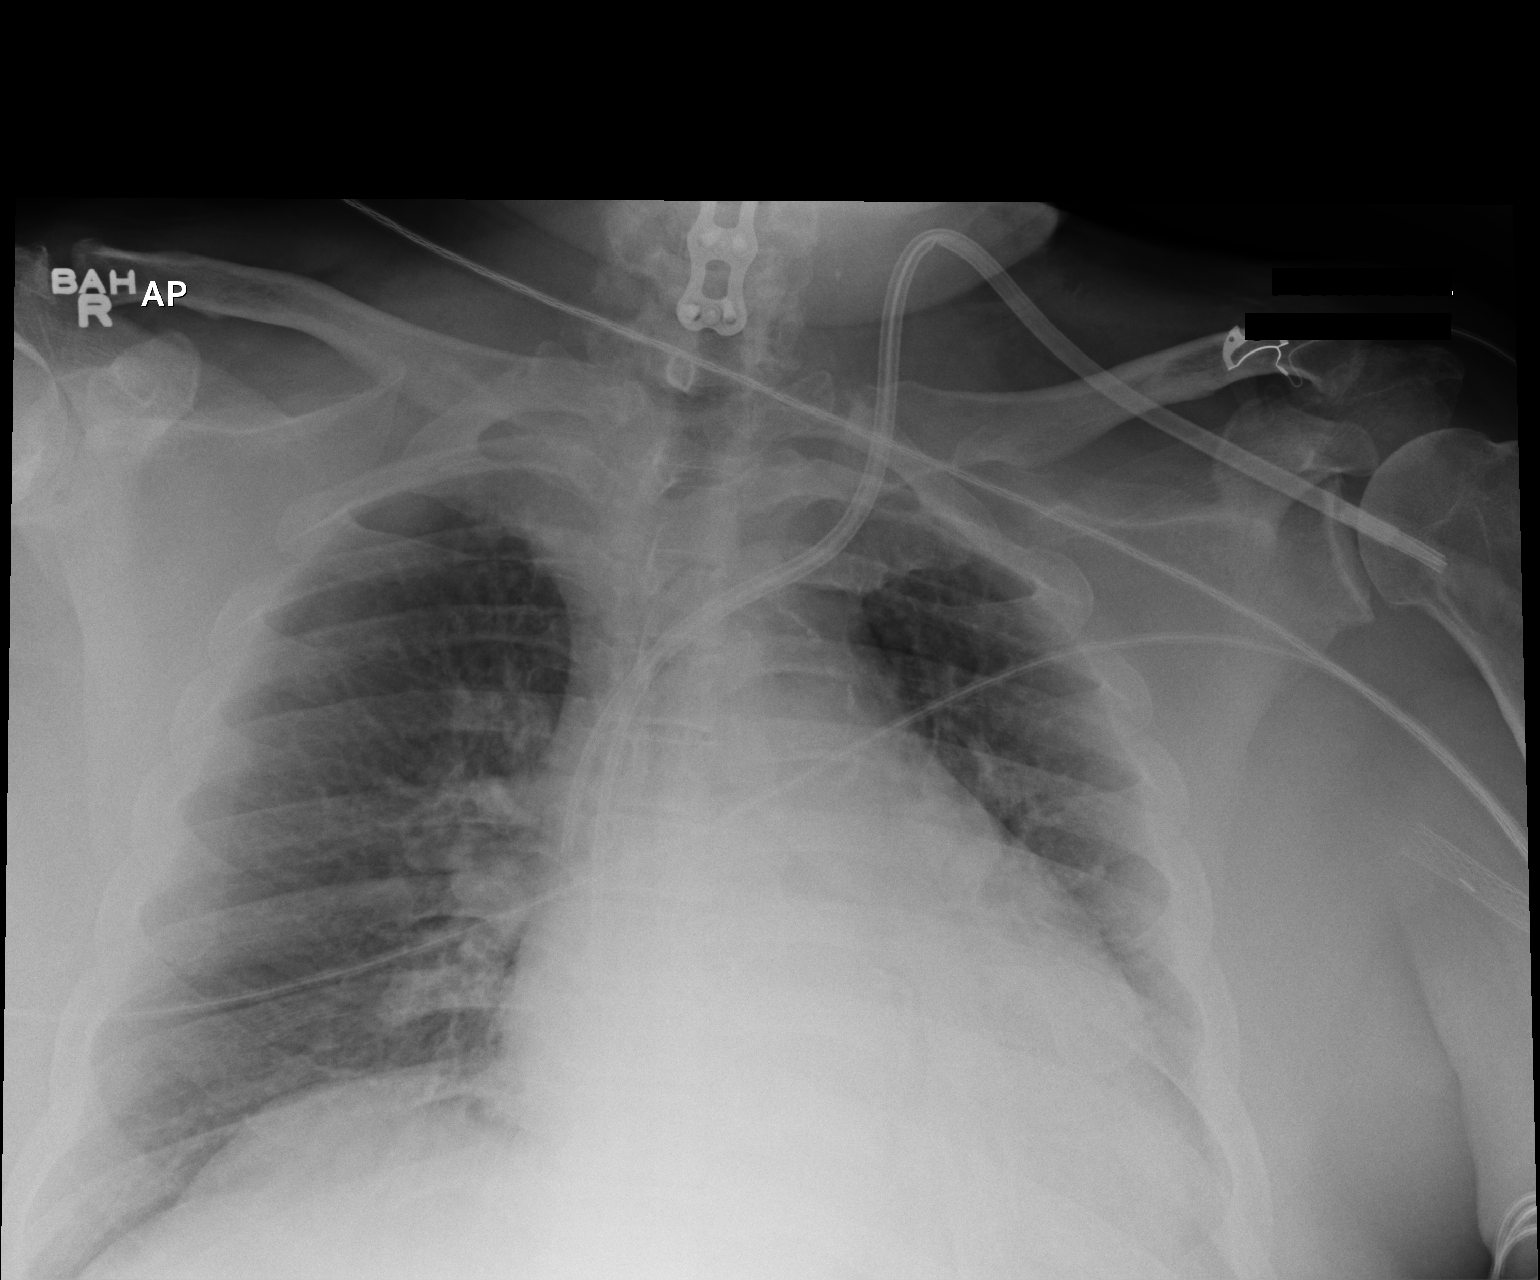

[1 of 1 positions shown; findings below may reference images not displayed]

FINDINGS: Prior right IJ dual lumen catheter has been removed.

Interval placement of a left IJ dual lumen dialysis catheter with
its distal tip in the upper right atrium.  Of note, catheter is
mildly kinked in the left supraclavicular region, likely near the
venotomy site.

Low lung volumes. No pleural effusion or pneumothorax.

Cardiomegaly.

Cervical spine fixation hardware.  Left axillary stent.
IMPRESSION: Interval placement of a left IJ dual lumen dialysis catheter with
its distal tip in the upper right atrium.

Mild kinking of the catheter near the venotomy site.  Correlate for
adequate flow rate.

No pneumothorax.

## 2013-12-03 IMAGING — CR DG CHEST 2V
2 series · 2 of 2 positions shown · non-contrast
Comparison: 07/09/2012

CLINICAL DATA: Hypertension and end-stage renal disease.  Dialysis
catheter removal and insertion.  Preop.

CHEST - 2 VIEW

[w chest pa]
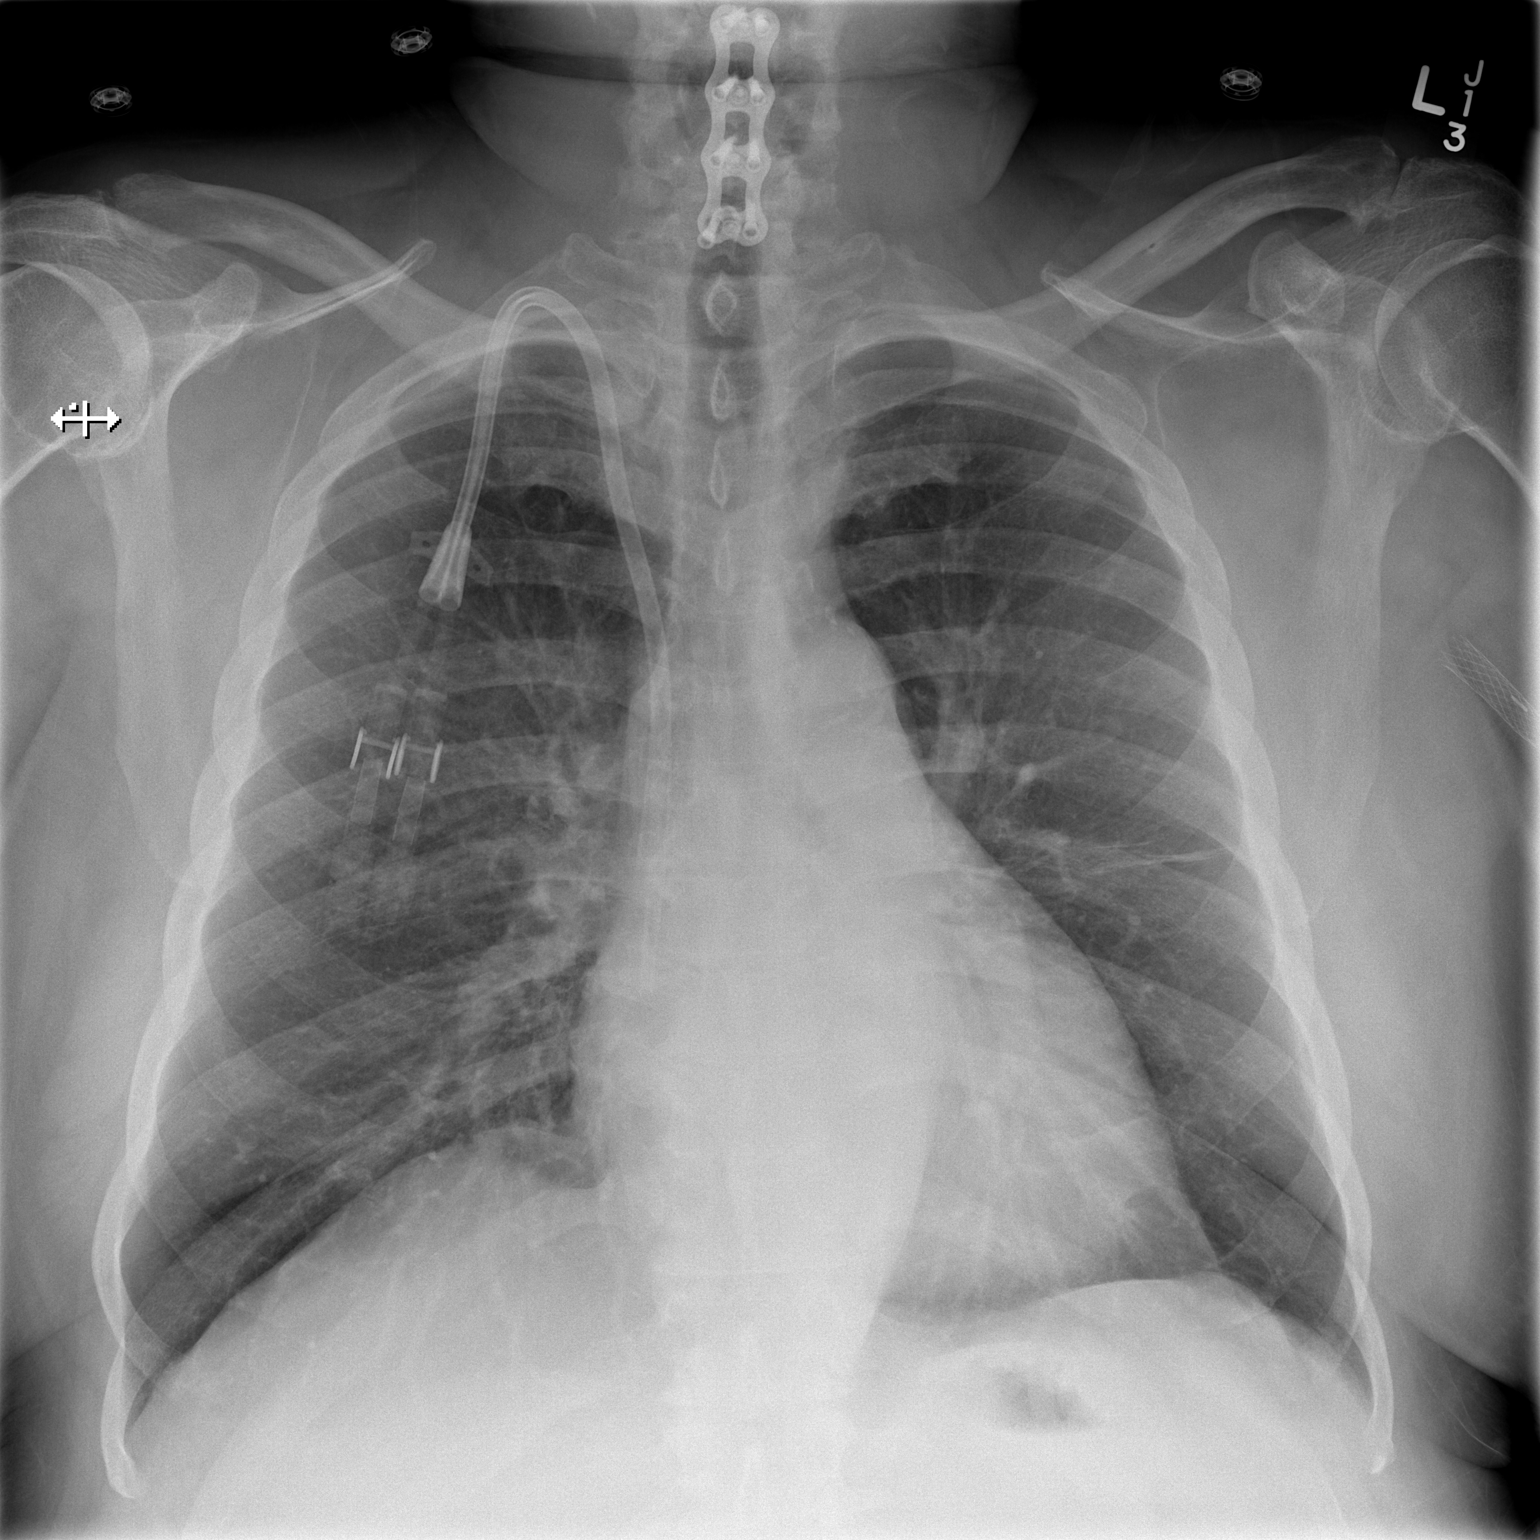

[w chest lat]
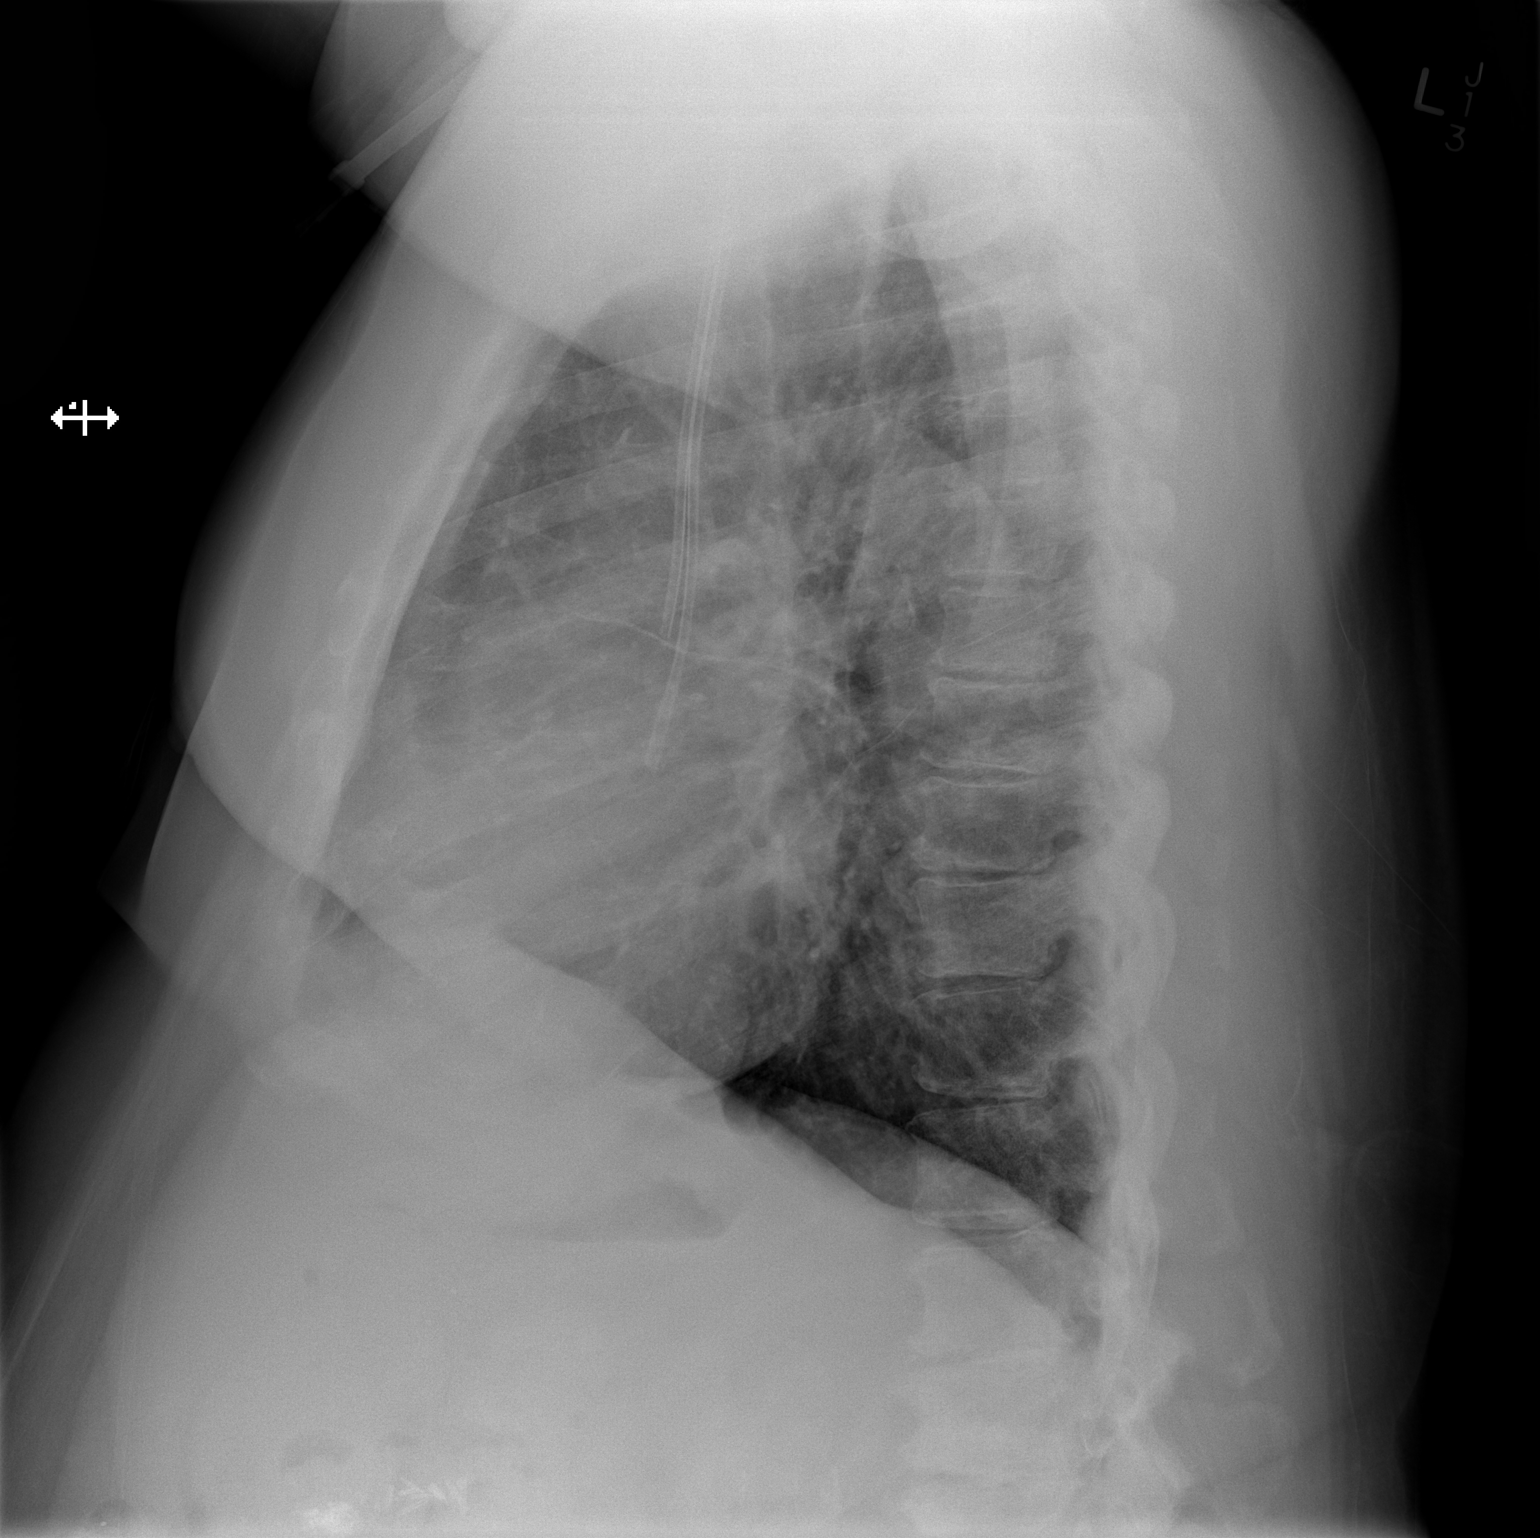

[2 of 2 positions shown; findings below may reference images not displayed]

FINDINGS: Lower cervical spine fixation.  Right IJ dialysis
catheter which terminates at the low SVC and cavoatrial junction.

A suspicion of the lateral left second rib remote trauma. Midline
trachea.  Borderline cardiomegaly.     No pleural effusion or
pneumothorax.  Minimal left midlung scarring laterally.
IMPRESSION: No acute cardiopulmonary disease.

## 2013-12-16 ENCOUNTER — Encounter: Payer: Self-pay | Admitting: Endocrinology

## 2013-12-16 ENCOUNTER — Ambulatory Visit (INDEPENDENT_AMBULATORY_CARE_PROVIDER_SITE_OTHER): Payer: Medicare HMO | Admitting: Endocrinology

## 2013-12-16 VITALS — BP 132/90 | HR 95 | Temp 98.5°F | Ht 73.0 in | Wt 240.0 lb

## 2013-12-16 DIAGNOSIS — E0590 Thyrotoxicosis, unspecified without thyrotoxic crisis or storm: Secondary | ICD-10-CM

## 2013-12-16 DIAGNOSIS — E119 Type 2 diabetes mellitus without complications: Secondary | ICD-10-CM

## 2013-12-16 DIAGNOSIS — N2581 Secondary hyperparathyroidism of renal origin: Secondary | ICD-10-CM

## 2013-12-16 DIAGNOSIS — E291 Testicular hypofunction: Secondary | ICD-10-CM

## 2013-12-16 DIAGNOSIS — R200 Anesthesia of skin: Secondary | ICD-10-CM

## 2013-12-16 DIAGNOSIS — R209 Unspecified disturbances of skin sensation: Secondary | ICD-10-CM

## 2013-12-16 NOTE — Patient Instructions (Addendum)
blood tests are being requested for you today.  We'll contact you with results. Please come back for a follow-up appointment in 4 months. check your blood sugar once a day.  vary the time of day when you check, between before the 3 meals, and at bedtime.  also check if you have symptoms of your blood sugar being too high or too low.  please keep a record of the readings and bring it to your next appointment here.  You can write it on any piece of paper.  please call us sooner if your blood sugar goes below 70, or if you have a lot of readings over 200.

## 2013-12-16 NOTE — Progress Notes (Signed)
Subjective:    Patient ID: [name], male    DOB: 1950-12-27, 63 y.o.   MRN: [id number]  HPI The state of at least three ongoing medical problems is addressed today: Pt returns for f/u of type 2 DM (dx'ed 2003, on a routine blood test; he took insulin from 2010 until 2013; he has not recently required any medication (except he takes welchol); complicated by peripheral sensory neuropathy and CAD).  He has lost a few more lbs.  no cbg record, but states cbg's are well-controlled.   Hyperthyroidism, due to a multinodular goiter: he has not recently required tapazole.  He has fatigue.  Hypogonadism was noted at dialysis.  He still has generalized muscle weakness.  He says ED sxs persist.  He takes clomid as rx'ed.  Past Medical History  Diagnosis Date  . Hyperthyroidism   . GASTROENTERITIS, VIRAL 10/14/2009  . ONYCHOMYCOSIS, TOENAILS 12/26/2007  . GOITER, MULTINODULAR 12/26/2007  . HYPERTHYROIDISM 02/02/2010  . DIABETES MELLITUS, TYPE II 02/01/2010  . DYSLIPIDEMIA 06/18/2007  . GOUT 06/18/2007  . Hypocalcemia 06/07/2010  . DEMENTIA 09/02/2009  . DEPRESSION 10/14/2009  . PERIPHERAL NEUROPATHY 06/18/2007  . HYPERTENSION 06/18/2007  . CONGESTIVE HEART FAILURE 06/18/2007  . DIASTOLIC HEART FAILURE, CHRONIC 02/06/2009  . Unspecified hypotension 01/30/2010  . PULMONARY NODULE, RIGHT LOWER LOBE 06/08/2009  . GERD 06/18/2007  . ESRD 08/04/2010  . BENIGN PROSTATIC HYPERTROPHY 10/14/2009  . GYNECOMASTIA 07/17/2010  . NECK PAIN 07/31/2010  . FOOT PAIN 08/12/2008  . DIZZINESS 07/17/2010  . Other malaise and fatigue 11/24/2009  . GAIT DISTURBANCE 03/03/2010  . DYSPNEA 10/29/2008  . CHEST PAIN 03/29/2010  . TRANSAMINASES, SERUM, ELEVATED 02/01/2010  . COLONIC POLYPS, HX OF 10/14/2009  . Anemia 06/16/2011  . Ischemic cardiomyopathy 06/16/2011  . Hyperparathyroidism, secondary 06/16/2011  . OSA on CPAP 10/16/2011  . Hyperlipidemia 10/16/2011  . CAD, NATIVE VESSEL 02/06/2009    saw Dr. Missy Sabins last jan 2013  . Sleep  apnea     cpap machine and o2    Past Surgical History  Procedure Laterality Date  . Stent 06/11/08    . Neck sugury  2/09  . Back surgury  1998  . Givens capsule study  09/29/2011    Procedure: GIVENS CAPSULE STUDY;  Surgeon: Missy Sabins, MD;  Location: [place];  Service: Endoscopy;  Laterality: Left;  . Esophagogastroduodenoscopy  09/28/2011    Procedure: ESOPHAGOGASTRODUODENOSCOPY (EGD);  Surgeon: Missy Sabins, MD;  Location: [facility] ENDOSCOPY;  Service: Endoscopy;  Laterality: N/A;  . Givens capsule study  09/30/2011    Procedure: GIVENS CAPSULE STUDY;  Surgeon: Jeryl Columbia, MD;  Location: [facility] ENDOSCOPY;  Service: Endoscopy;  Laterality: N/A;  . Cholecystectomy    . Cardiac catheterization    . Tonsillectomy    . Av fistula placement  11/07/2011    Procedure: INSERTION OF ARTERIOVENOUS (AV) GORE-TEX GRAFT ARM;  Surgeon: Tinnie Gens, MD;  Location: [place];  Service: Vascular;  Laterality: Left;  . Dialysis fistula creation  2009    right  . Insertion of dialysis catheter  2014    right side  . Insertion of dialysis catheter Left 02/11/2013    Procedure: INSERTION OF DIALYSIS CATHETER;  Surgeon: Conrad [place], MD;  Location: [name];  Service: Vascular;  Laterality: Left;  Ultrasound guided  . Removal of a dialysis catheter Right 02/11/2013    Procedure: REMOVAL OF A DIALYSIS CATHETER;  Surgeon: Conrad [place], MD;  Location: [place];  Service: Vascular;  Laterality: Right;  . Bascilic vein transposition Right 02/27/2013    Procedure: [place];  Surgeon: Mal Misty, MD;  Location: [place];  Service: Vascular;  Laterality: Right;  Right Basilic Vein Transposition     History   Social History  . Marital Status: Married    Spouse Name: N/A    Number of Children: N/A  . Years of Education: N/A   Occupational History  . disabled   . formerly Lawyer for [employer].    Social History Main Topics  . Smoking status: Former Smoker -- 1.00 packs/day for 25 years     Types: Cigarettes  . Smokeless tobacco: Never Used     Comment: Quit smoking 2007 Smoked x 25 years 1/2 ppd.  . Alcohol Use: Yes     Comment: occassional   . Drug Use: No  . Sexual Activity: Not on file   Other Topics Concern  . Not on file   Social History Narrative  . No narrative on file    Current Outpatient Prescriptions on File Prior to Visit  Medication Sig Dispense Refill  . allopurinol (ZYLOPRIM) 100 MG tablet Take 100 mg by mouth daily.       [name] amitriptyline (ELAVIL) 50 MG tablet Take 50 mg by mouth daily as needed (rest).       [name] amLODipine (NORVASC) 10 MG tablet Take 10 mg by mouth daily.      [name] aspirin 81 MG EC tablet Take 81 mg by mouth daily.       . calcium-vitamin D (OSCAL WITH D) 500-200 MG-UNIT per tablet Take 3 tablets by mouth 3 (three) times daily with meals.       . carvedilol (COREG) 25 MG tablet Take 12.5 mg by mouth daily.      . cinacalcet (SENSIPAR) 60 MG tablet Take 60 mg by mouth 2 (two) times daily.       . clomiPHENE (CLOMID) 50 MG tablet 1/4 tab daily  10 tablet  11  . colesevelam (WELCHOL) 625 MG tablet Take 3 tablets (1,875 mg total) by mouth 2 (two) times daily with a meal.  540 tablet  3  . gabapentin (NEURONTIN) 300 MG capsule Take 1 capsule (300 mg total) by mouth at bedtime as needed. For pain  90 capsule  1  . glucose blood (FREESTYLE TEST STRIPS) test strip 1 each by Other route daily. And lancets 1/day 250.40  100 each  12  . lanthanum (FOSRENOL) 1000 MG chewable tablet Chew 1,000 mg by mouth 2 times daily at 12 noon and 4 pm.      . meclizine (ANTIVERT) 12.5 MG tablet Take 1 tablet (12.5 mg total) by mouth 3 (three) times daily as needed for dizziness.  30 tablet  1  . multivitamin (RENA-VIT) TABS tablet Take 1 tablet by mouth daily.        . Omega-3 Fatty Acids (FISH OIL) 1000 MG CAPS Take 1 capsule by mouth daily as needed (for vitamin).       . ondansetron (ZOFRAN) 4 MG tablet Take 1 tablet (4 mg total) by mouth every 8 (eight) hours  as needed for nausea or vomiting.  30 tablet  0  . oxyCODONE (ROXICODONE) 5 MG immediate release tablet Take 1 tablet (5 mg total) by mouth every 4 (four) hours as needed for pain.  30 tablet  0  . ranitidine (ZANTAC) 300 MG tablet Take 300 mg by mouth at bedtime.  No current facility-administered medications on file prior to visit.    Allergies  Allergen Reactions  . Cephalexin Other (See Comments)    Tongue swelling  . Statins     Weak muscles    Family History  Problem Relation Age of Onset  . Heart disease Father   . Heart disease Sister     BP 132/90  Pulse 95  Temp(Src) 98.5 F (36.9 C) (Oral)  Ht 6\' 1"  (1.854 m)  Wt 240 lb (108.863 kg)  BMI 31.67 kg/m2  SpO2 98%  Review of Systems Denies SOB and chest pain.    Objective:   Physical Exam VITAL SIGNS:  See vs page GENERAL: no distress NECK: There is no palpable thyroid enlargement.  No thyroid nodule is palpable.  No palpable lymphadenopathy at the anterior neck.     Assessment & Plan:  Hyperthyroidism: this will most likely recur with time.   ESRD: because pt is on dialysis, we can't do I-131 rx.   ED: I hesitate to rx viagra, in view of labile HTN. DM: he probably does not require medication, due to ESRD.

## 2013-12-19 LAB — TSH: TSH: 0.546 u[IU]/mL (ref 0.350–4.500)

## 2013-12-19 LAB — HEMOGLOBIN A1C
Hgb A1c MFr Bld: 6.3 % — ABNORMAL HIGH (ref <5.7)
Mean Plasma Glucose: 134 mg/dL — ABNORMAL HIGH (ref <117)

## 2013-12-19 LAB — TESTOSTERONE: Testosterone: 622 ng/dL (ref 300–890)

## 2013-12-19 LAB — T4, FREE: Free T4: 1.31 ng/dL (ref 0.80–1.80)

## 2014-01-07 ENCOUNTER — Telehealth: Payer: Self-pay | Admitting: Internal Medicine

## 2014-01-07 MED ORDER — COLESEVELAM HCL 625 MG PO TABS: 1875.0000 mg | ORAL_TABLET | Freq: Two times a day (BID) | ORAL | Status: DC

## 2014-01-07 NOTE — Telephone Encounter (Signed)
Patient's wife is calling to request a refill on the patient's colesevelam (WELCHOL) 625 MG tablet. He is completely out and she wants to know if there are samples available, if he is not able to have any prescribed until his OV next week.

## 2014-01-07 NOTE — Telephone Encounter (Signed)
Called the patient sent in #30 day supply as informed to do.  Also left 3 bottles (samples) #18 each at the front.

## 2014-01-13 ENCOUNTER — Ambulatory Visit (INDEPENDENT_AMBULATORY_CARE_PROVIDER_SITE_OTHER): Payer: Commercial Managed Care - HMO | Admitting: Internal Medicine

## 2014-01-13 ENCOUNTER — Encounter: Payer: Self-pay | Admitting: Internal Medicine

## 2014-01-13 ENCOUNTER — Other Ambulatory Visit: Payer: Self-pay | Admitting: Internal Medicine

## 2014-01-13 ENCOUNTER — Other Ambulatory Visit (INDEPENDENT_AMBULATORY_CARE_PROVIDER_SITE_OTHER): Payer: Commercial Managed Care - HMO

## 2014-01-13 VITALS — BP 122/70 | HR 91 | Temp 98.0°F | Ht 73.0 in | Wt 240.2 lb

## 2014-01-13 DIAGNOSIS — F3289 Other specified depressive episodes: Secondary | ICD-10-CM

## 2014-01-13 DIAGNOSIS — R972 Elevated prostate specific antigen [PSA]: Secondary | ICD-10-CM

## 2014-01-13 DIAGNOSIS — Z23 Encounter for immunization: Secondary | ICD-10-CM

## 2014-01-13 DIAGNOSIS — F329 Major depressive disorder, single episode, unspecified: Secondary | ICD-10-CM

## 2014-01-13 DIAGNOSIS — Z0000 Encounter for general adult medical examination without abnormal findings: Secondary | ICD-10-CM

## 2014-01-13 LAB — HEPATIC FUNCTION PANEL
ALBUMIN: 4 g/dL (ref 3.5–5.2)
ALK PHOS: 59 U/L (ref 39–117)
ALT: 24 U/L (ref 0–53)
AST: 30 U/L (ref 0–37)
Bilirubin, Direct: 0.1 mg/dL (ref 0.0–0.3)
Total Bilirubin: 1.1 mg/dL (ref 0.3–1.2)
Total Protein: 7.5 g/dL (ref 6.0–8.3)

## 2014-01-13 LAB — LIPID PANEL
CHOL/HDL RATIO: 7
Cholesterol: 288 mg/dL — ABNORMAL HIGH (ref 0–200)
HDL: 42.9 mg/dL (ref >39.00)
LDL CALC: 185 mg/dL — AB (ref 0–99)
TRIGLYCERIDES: 300 mg/dL — AB (ref 0.0–149.0)
VLDL: 60 mg/dL — ABNORMAL HIGH (ref 0.0–40.0)

## 2014-01-13 LAB — PSA: PSA: 4.03 ng/mL — AB (ref 0.10–4.00)

## 2014-01-13 MED ORDER — ALLOPURINOL 100 MG PO TABS: 100.0000 mg | ORAL_TABLET | Freq: Every day | ORAL | Status: DC

## 2014-01-13 MED ORDER — COLESEVELAM HCL 625 MG PO TABS: 1875.0000 mg | ORAL_TABLET | Freq: Two times a day (BID) | ORAL | Status: DC

## 2014-01-13 MED ORDER — ESCITALOPRAM OXALATE 10 MG PO TABS: 10.0000 mg | ORAL_TABLET | Freq: Every day | ORAL | Status: DC

## 2014-01-13 NOTE — Assessment & Plan Note (Signed)
To start lexapro 10  qd 

## 2014-01-13 NOTE — Progress Notes (Signed)
Subjective:    Patient ID: [name], male    DOB: [birth year], 63 y.o.   MRN: [id number]  HPI  Here for wellness and f/u;  Overall doing ok;  Pt denies CP, worsening SOB, DOE, wheezing, orthopnea, PND, worsening LE edema, palpitations, dizziness or syncope.  Pt denies neurological change such as new headache, facial or extremity weakness.  Pt denies polydipsia, polyuria, or low sugar symptoms. Pt states overall good compliance with treatment and medications, good tolerability, and has been trying to follow lower cholesterol diet.  Pt has had mild worsening depressive symptoms, but no suicidal ideation or panic. No fever, night sweats, wt loss, loss of appetite, or other constitutional symptoms.  Pt states good ability with ADL's, has low fall risk, home safety reviewed and adequate, no other significant changes in hearing or vision, and only occasionally active with exercise.  Makes some urine. Still on dialysis t-th-sat, still c/o lack of energy.  Does use the CPAP at night, denies signficant daytime somnolence, bigger issue is getting to sleep. Cont's to see Rne[facility], as well Dr Rocco Pauls for dm.  Due for Prevnar. Past Medical History  Diagnosis Date  . Hyperthyroidism   . GASTROENTERITIS, VIRAL 10/14/2009  . ONYCHOMYCOSIS, TOENAILS 12/26/2007  . GOITER, MULTINODULAR 12/26/2007  . HYPERTHYROIDISM 02/02/2010  . DIABETES MELLITUS, TYPE II 02/01/2010  . DYSLIPIDEMIA 06/18/2007  . GOUT 06/18/2007  . Hypocalcemia 06/07/2010  . DEMENTIA 09/02/2009  . DEPRESSION 10/14/2009  . PERIPHERAL NEUROPATHY 06/18/2007  . HYPERTENSION 06/18/2007  . CONGESTIVE HEART FAILURE 06/18/2007  . DIASTOLIC HEART FAILURE, CHRONIC 02/06/2009  . Unspecified hypotension 01/30/2010  . PULMONARY NODULE, RIGHT LOWER LOBE 06/08/2009  . GERD 06/18/2007  . ESRD 08/04/2010  . BENIGN PROSTATIC HYPERTROPHY 10/14/2009  . GYNECOMASTIA 07/17/2010  . NECK PAIN 07/31/2010  . FOOT PAIN 08/12/2008  . DIZZINESS 07/17/2010  . Other malaise and  fatigue 11/24/2009  . GAIT DISTURBANCE 03/03/2010  . DYSPNEA 10/29/2008  . CHEST PAIN 03/29/2010  . TRANSAMINASES, SERUM, ELEVATED 02/01/2010  . COLONIC POLYPS, HX OF 10/14/2009  . Anemia 06/16/2011  . Ischemic cardiomyopathy 06/16/2011  . Hyperparathyroidism, secondary 06/16/2011  . OSA on CPAP 10/16/2011  . Hyperlipidemia 10/16/2011  . CAD, NATIVE VESSEL 02/06/2009    saw Dr. Missy Sabins last jan 2013  . Sleep apnea     cpap machine and o2   Past Surgical History  Procedure Laterality Date  . Stent 06/11/08    . Neck sugury  2/09  . Back surgury  1998  . Givens capsule study  09/29/2011    Procedure: GIVENS CAPSULE STUDY;  Surgeon: Missy Sabins, MD;  Location: [place];  Service: Endoscopy;  Laterality: Left;  . Esophagogastroduodenoscopy  09/28/2011    Procedure: ESOPHAGOGASTRODUODENOSCOPY (EGD);  Surgeon: Missy Sabins, MD;  Location: [facility] ENDOSCOPY;  Service: Endoscopy;  Laterality: N/A;  . Givens capsule study  09/30/2011    Procedure: GIVENS CAPSULE STUDY;  Surgeon: Jeryl Columbia, MD;  Location: [facility] ENDOSCOPY;  Service: Endoscopy;  Laterality: N/A;  . Cholecystectomy    . Cardiac catheterization    . Tonsillectomy    . Av fistula placement  11/07/2011    Procedure: INSERTION OF ARTERIOVENOUS (AV) GORE-TEX GRAFT ARM;  Surgeon: Tinnie Gens, MD;  Location: [place];  Service: Vascular;  Laterality: Left;  . Dialysis fistula creation  2009    right  . Insertion of dialysis catheter  2014    right side  . Insertion of dialysis catheter Left 02/11/2013  Procedure: INSERTION OF DIALYSIS CATHETER;  Surgeon: Conrad [place], MD;  Location: [place];  Service: Vascular;  Laterality: Left;  Ultrasound guided  . Removal of a dialysis catheter Right 02/11/2013    Procedure: REMOVAL OF A DIALYSIS CATHETER;  Surgeon: Conrad Franklin, MD;  Location: [place];  Service: Vascular;  Laterality: Right;  . Bascilic vein transposition Right 02/27/2013    Procedure: BASCILIC VEIN TRANSPOSITION;  Surgeon: Mal Misty, MD;   Location: [place];  Service: Vascular;  Laterality: Right;  Right Basilic Vein Transposition     reports that he has quit smoking. His smoking use included Cigarettes. He has a 25 pack-year smoking history. He has never used smokeless tobacco. He reports that he drinks alcohol. He reports that he does not use illicit drugs. family history includes Heart disease in his father and sister. Allergies  Allergen Reactions  . Cephalexin Other (See Comments)    Tongue swelling  . Statins     Weak muscles   Current Outpatient Prescriptions on File Prior to Visit  Medication Sig Dispense Refill  . amitriptyline (ELAVIL) 50 MG tablet Take 50 mg by mouth daily as needed (rest).       [name] amLODipine (NORVASC) 10 MG tablet Take 10 mg by mouth daily.      [name] aspirin 81 MG EC tablet Take 81 mg by mouth daily.       . calcium-vitamin D (OSCAL WITH D) 500-200 MG-UNIT per tablet Take 3 tablets by mouth 3 (three) times daily with meals.       . carvedilol (COREG) 25 MG tablet Take 12.5 mg by mouth daily.      . cinacalcet (SENSIPAR) 60 MG tablet Take 60 mg by mouth 2 (two) times daily.       . clomiPHENE (CLOMID) 50 MG tablet 1/4 tab daily  10 tablet  11  . gabapentin (NEURONTIN) 300 MG capsule Take 1 capsule (300 mg total) by mouth at bedtime as needed. For pain  90 capsule  1  . glucose blood (FREESTYLE TEST STRIPS) test strip 1 each by Other route daily. And lancets 1/day 250.40  100 each  12  . lanthanum (FOSRENOL) 1000 MG chewable tablet Chew 1,000 mg by mouth 2 times daily at 12 noon and 4 pm.      . multivitamin (RENA-VIT) TABS tablet Take 1 tablet by mouth daily.        . ondansetron (ZOFRAN) 4 MG tablet Take 1 tablet (4 mg total) by mouth every 8 (eight) hours as needed for nausea or vomiting.  30 tablet  0  . ranitidine (ZANTAC) 300 MG tablet Take 300 mg by mouth at bedtime.       No current facility-administered medications on file prior to visit.      Review of Systems Constitutional: Negative for  diaphoresis, activity change, appetite change or unexpected weight change.  HENT: Negative for hearing loss, ear pain, facial swelling, mouth sores and neck stiffness.   Eyes: Negative for pain, redness and visual disturbance.  Respiratory: Negative for shortness of breath and wheezing.   Cardiovascular: Negative for chest pain and palpitations.  Gastrointestinal: Negative for diarrhea, blood in stool, abdominal distention or other pain Genitourinary: Negative for hematuria, flank pain or change in urine volume.  Musculoskeletal: Negative for myalgias and joint swelling.  Skin: Negative for color change and wound.  Neurological: Negative for syncope and numbness. other than noted Hematological: Negative for adenopathy.  Psychiatric/Behavioral: Negative for hallucinations, self-injury, decreased  concentration and agitation.      Objective:   Physical Exam BP 122/70  Pulse 91  Temp(Src) 98 F (36.7 C) (Oral)  Ht 6\' 1"  (1.854 m)  Wt 240 lb 4 oz (108.977 kg)  BMI 31.70 kg/m2  SpO2 94% VS noted,  Constitutional: Pt is oriented to person, place, and time. Appears well-developed and well-nourished.  Head: Normocephalic and atraumatic.  Right Ear: External ear normal.  Left Ear: External ear normal.  Nose: Nose normal.  Mouth/Throat: Oropharynx is clear and moist.  Eyes: Conjunctivae and EOM are normal. Pupils are equal, round, and reactive to light.  Neck: Normal range of motion. Neck supple. No JVD present. No tracheal deviation present.  Cardiovascular: Normal rate, regular rhythm, normal heart sounds and intact distal pulses.   Pulmonary/Chest: Effort normal and breath sounds normal.  Abdominal: Soft. Bowel sounds are normal. There is no tenderness. No HSM  Musculoskeletal: Normal range of motion. Exhibits no edema.  Lymphadenopathy:  Has no cervical adenopathy.  Neurological: Pt is alert and oriented to person, place, and time. Pt has normal reflexes. No cranial nerve deficit.    Skin: Skin is warm and dry. No rash noted.  Psychiatric:  Has mild depresed mood and affect. Behavior is normal.     Assessment & Plan:

## 2014-01-13 NOTE — Assessment & Plan Note (Signed)

## 2014-01-13 NOTE — Patient Instructions (Signed)
You had the new Prevnar pneuomonia shot today  Please take all new medication as prescribed  - the lexapro 10 mg per day  Please continue all other medications as before, and refills have been done if requested. Please have the pharmacy call with any other refills you may need.  Please continue your efforts at being more active, low cholesterol diet, and weight control. You are otherwise up to date with prevention measures today.  Please keep your appointments with your specialists as you have planned - including the dialysis  Please go to the LAB in the Basement (turn left off the elevator) for the tests to be done today You will be contacted by phone if any changes need to be made immediately.  Otherwise, you will receive a letter about your results with an explanation, but please check with MyChart first.  Please remember to sign up for MyChart if you have not done so, as this will be important to you in the future with finding out test results, communicating by private email, and scheduling acute appointments online when needed.  Please return in 6 months, or sooner if needed

## 2014-01-13 NOTE — Addendum Note (Signed)
Addended by: Sharon Seller B on: 01/13/2014 03:12 PM   Modules accepted: Orders

## 2014-01-13 NOTE — Progress Notes (Signed)
Pre visit review using our clinic review tool, if applicable. No additional management support is needed unless otherwise documented below in the visit note. 

## 2014-01-30 DIAGNOSIS — D631 Anemia in chronic kidney disease: Secondary | ICD-10-CM | POA: Diagnosis not present

## 2014-01-30 DIAGNOSIS — E1129 Type 2 diabetes mellitus with other diabetic kidney complication: Secondary | ICD-10-CM | POA: Diagnosis not present

## 2014-01-30 DIAGNOSIS — N186 End stage renal disease: Secondary | ICD-10-CM | POA: Diagnosis not present

## 2014-01-30 DIAGNOSIS — N2581 Secondary hyperparathyroidism of renal origin: Secondary | ICD-10-CM | POA: Diagnosis not present

## 2014-02-01 DIAGNOSIS — N139 Obstructive and reflux uropathy, unspecified: Secondary | ICD-10-CM | POA: Diagnosis not present

## 2014-02-01 DIAGNOSIS — N138 Other obstructive and reflux uropathy: Secondary | ICD-10-CM | POA: Diagnosis not present

## 2014-02-01 DIAGNOSIS — R972 Elevated prostate specific antigen [PSA]: Secondary | ICD-10-CM | POA: Diagnosis not present

## 2014-02-01 DIAGNOSIS — N186 End stage renal disease: Secondary | ICD-10-CM | POA: Diagnosis not present

## 2014-02-01 DIAGNOSIS — N401 Enlarged prostate with lower urinary tract symptoms: Secondary | ICD-10-CM | POA: Diagnosis not present

## 2014-02-15 DIAGNOSIS — I871 Compression of vein: Secondary | ICD-10-CM | POA: Diagnosis not present

## 2014-02-15 DIAGNOSIS — N186 End stage renal disease: Secondary | ICD-10-CM | POA: Diagnosis not present

## 2014-02-15 DIAGNOSIS — T82898A Other specified complication of vascular prosthetic devices, implants and grafts, initial encounter: Secondary | ICD-10-CM | POA: Diagnosis not present

## 2014-02-24 ENCOUNTER — Ambulatory Visit (INDEPENDENT_AMBULATORY_CARE_PROVIDER_SITE_OTHER): Payer: Medicare Other | Admitting: Internal Medicine

## 2014-02-24 ENCOUNTER — Encounter: Payer: Self-pay | Admitting: Internal Medicine

## 2014-02-24 VITALS — BP 132/80 | HR 91 | Temp 98.1°F | Ht 73.0 in | Wt 241.5 lb

## 2014-02-24 DIAGNOSIS — G4733 Obstructive sleep apnea (adult) (pediatric): Secondary | ICD-10-CM

## 2014-02-24 DIAGNOSIS — I10 Essential (primary) hypertension: Secondary | ICD-10-CM

## 2014-02-24 DIAGNOSIS — I5032 Chronic diastolic (congestive) heart failure: Secondary | ICD-10-CM | POA: Diagnosis not present

## 2014-02-24 DIAGNOSIS — R972 Elevated prostate specific antigen [PSA]: Secondary | ICD-10-CM

## 2014-02-24 DIAGNOSIS — J309 Allergic rhinitis, unspecified: Secondary | ICD-10-CM

## 2014-02-24 DIAGNOSIS — D649 Anemia, unspecified: Secondary | ICD-10-CM

## 2014-02-24 DIAGNOSIS — Z9989 Dependence on other enabling machines and devices: Principal | ICD-10-CM

## 2014-02-24 HISTORY — DX: Allergic rhinitis, unspecified: J30.9

## 2014-02-24 MED ORDER — CITALOPRAM HYDROBROMIDE 20 MG PO TABS: 20.0000 mg | ORAL_TABLET | Freq: Every day | ORAL | Status: DC

## 2014-02-24 MED ORDER — GABAPENTIN 300 MG PO CAPS
300.0000 mg | ORAL_CAPSULE | Freq: Every evening | ORAL | Status: DC | PRN
Start: 2014-02-24 — End: 2015-01-07

## 2014-02-24 NOTE — Assessment & Plan Note (Signed)
For f/u with Dr Janice Norrie in 3 mo

## 2014-02-24 NOTE — Assessment & Plan Note (Signed)
Ok to d/c home o2 after today exam

## 2014-02-24 NOTE — Assessment & Plan Note (Signed)
Declines lab today, for f/u with dialysis

## 2014-02-24 NOTE — Assessment & Plan Note (Signed)
stable overall by history and exam, and pt to continue medical treatment as before,  to f/u any worsening symptoms or concerns 

## 2014-02-24 NOTE — Assessment & Plan Note (Signed)
Ok for [employer] prn

## 2014-02-24 NOTE — Progress Notes (Signed)
Pre visit review using our clinic review tool, if applicable. No additional management support is needed unless otherwise documented below in the visit note. 

## 2014-02-24 NOTE — Progress Notes (Signed)
Subjective:    Patient ID: [name], male    DOB: 06/28/1951, 63 y.o.   MRN: [id number]  HPI  Here to f/u, did see urology, Dr Janice Norrie with repeat lab and DRE; to f/u at 3 mo  - ? Need for biopsy.  Does c/o ongoing fatigue, but denies signficant daytime hypersomnolence. Does use CPAP nightly.  Here for oxygen re-eval , o2 sat at rest 96%, then 93% with ambulation, then 94% with rest today. Pt denies chest pain, increased sob or doe, wheezing, orthopnea, PND, increased LE swelling, palpitations, dizziness or syncope.  Was referred recently to endodontist for left upper tooth pain, but exam neg.  Does have several wks ongoing nasal allergy symptoms with clearish congestion, itch and sneezing.  Also with recurring left facial pain - ? Trigeminal neuralgiia, already on gabapentin, renal dosed. Past Medical History  Diagnosis Date  . Hyperthyroidism   . GASTROENTERITIS, VIRAL 10/14/2009  . ONYCHOMYCOSIS, TOENAILS 12/26/2007  . GOITER, MULTINODULAR 12/26/2007  . HYPERTHYROIDISM 02/02/2010  . DIABETES MELLITUS, TYPE II 02/01/2010  . DYSLIPIDEMIA 06/18/2007  . GOUT 06/18/2007  . Hypocalcemia 06/07/2010  . DEMENTIA 09/02/2009  . DEPRESSION 10/14/2009  . PERIPHERAL NEUROPATHY 06/18/2007  . HYPERTENSION 06/18/2007  . CONGESTIVE HEART FAILURE 06/18/2007  . DIASTOLIC HEART FAILURE, CHRONIC 02/06/2009  . Unspecified hypotension 01/30/2010  . PULMONARY NODULE, RIGHT LOWER LOBE 06/08/2009  . GERD 06/18/2007  . ESRD 08/04/2010  . BENIGN PROSTATIC HYPERTROPHY 10/14/2009  . GYNECOMASTIA 07/17/2010  . NECK PAIN 07/31/2010  . FOOT PAIN 08/12/2008  . DIZZINESS 07/17/2010  . Other malaise and fatigue 11/24/2009  . GAIT DISTURBANCE 03/03/2010  . DYSPNEA 10/29/2008  . CHEST PAIN 03/29/2010  . TRANSAMINASES, SERUM, ELEVATED 02/01/2010  . COLONIC POLYPS, HX OF 10/14/2009  . Anemia 06/16/2011  . Ischemic cardiomyopathy 06/16/2011  . Hyperparathyroidism, secondary 06/16/2011  . OSA on CPAP 10/16/2011  . Hyperlipidemia 10/16/2011  . CAD,  NATIVE VESSEL 02/06/2009    saw Dr. Missy Sabins last jan 2013  . Sleep apnea     cpap machine and o2   Past Surgical History  Procedure Laterality Date  . Stent 06/11/08    . Neck sugury  2/09  . Back surgury  1998  . Givens capsule study  09/29/2011    Procedure: GIVENS CAPSULE STUDY;  Surgeon: Missy Sabins, MD;  Location: [place];  Service: Endoscopy;  Laterality: Left;  . Esophagogastroduodenoscopy  09/28/2011    Procedure: ESOPHAGOGASTRODUODENOSCOPY (EGD);  Surgeon: Missy Sabins, MD;  Location: [facility] ENDOSCOPY;  Service: Endoscopy;  Laterality: N/A;  . Givens capsule study  09/30/2011    Procedure: GIVENS CAPSULE STUDY;  Surgeon: Jeryl Columbia, MD;  Location: [facility] ENDOSCOPY;  Service: Endoscopy;  Laterality: N/A;  . Cholecystectomy    . Cardiac catheterization    . Tonsillectomy    . Av fistula placement  11/07/2011    Procedure: INSERTION OF ARTERIOVENOUS (AV) GORE-TEX GRAFT ARM;  Surgeon: Tinnie Gens, MD;  Location: [place];  Service: Vascular;  Laterality: Left;  . Dialysis fistula creation  2009    right  . Insertion of dialysis catheter  2014    right side  . Insertion of dialysis catheter Left 02/11/2013    Procedure: INSERTION OF DIALYSIS CATHETER;  Surgeon: Conrad [place], MD;  Location: [place];  Service: Vascular;  Laterality: Left;  Ultrasound guided  . Removal of a dialysis catheter Right 02/11/2013    Procedure: REMOVAL OF A DIALYSIS CATHETER;  Surgeon: Conrad [place],  MD;  Location: [place];  Service: Vascular;  Laterality: Right;  . Bascilic vein transposition Right 02/27/2013    Procedure: BASCILIC VEIN TRANSPOSITION;  Surgeon: Mal Misty, MD;  Location: [place];  Service: Vascular;  Laterality: Right;  Right Basilic Vein Transposition     reports that he has quit smoking. His smoking use included Cigarettes. He has a 25 pack-year smoking history. He has never used smokeless tobacco. He reports that he drinks alcohol. He reports that he does not use illicit drugs. family history  includes Heart disease in his father and sister. Allergies  Allergen Reactions  . Cephalexin Other (See Comments)    Tongue swelling  . Statins     Weak muscles   Current Outpatient Prescriptions on File Prior to Visit  Medication Sig Dispense Refill  . allopurinol (ZYLOPRIM) 100 MG tablet Take 1 tablet (100 mg total) by mouth daily.  30 tablet  11  . amitriptyline (ELAVIL) 50 MG tablet Take 50 mg by mouth daily as needed (rest).       [name] amLODipine (NORVASC) 10 MG tablet Take 10 mg by mouth daily.      [name] aspirin 81 MG EC tablet Take 81 mg by mouth daily.       . calcium-vitamin D (OSCAL WITH D) 500-200 MG-UNIT per tablet Take 3 tablets by mouth 3 (three) times daily with meals.       . carvedilol (COREG) 25 MG tablet Take 12.5 mg by mouth daily.      . cinacalcet (SENSIPAR) 60 MG tablet Take 60 mg by mouth 2 (two) times daily.       . clomiPHENE (CLOMID) 50 MG tablet 1/4 tab daily  10 tablet  11  . colesevelam (WELCHOL) 625 MG tablet Take 3 tablets (1,875 mg total) by mouth 2 (two) times daily with a meal.  180 tablet  11  . glucose blood (FREESTYLE TEST STRIPS) test strip 1 each by Other route daily. And lancets 1/day 250.40  100 each  12  . lanthanum (FOSRENOL) 1000 MG chewable tablet Chew 1,000 mg by mouth 2 times daily at 12 noon and 4 pm.      . multivitamin (RENA-VIT) TABS tablet Take 1 tablet by mouth daily.        . ondansetron (ZOFRAN) 4 MG tablet Take 1 tablet (4 mg total) by mouth every 8 (eight) hours as needed for nausea or vomiting.  30 tablet  0  . ranitidine (ZANTAC) 300 MG tablet Take 300 mg by mouth at bedtime.       No current facility-administered medications on file prior to visit.   Review of Systems  Constitutional: Negative for unusual diaphoresis or other sweats  HENT: Negative for ringing in ear Eyes: Negative for double vision or worsening visual disturbance.  Respiratory: Negative for choking and stridor.   Gastrointestinal: Negative for vomiting or other  signifcant bowel change Genitourinary: Negative for hematuria or decreased urine volume.  Musculoskeletal: Negative for other MSK pain or swelling Skin: Negative for color change and worsening wound.  Neurological: Negative for tremors and numbness other than noted  Psychiatric/Behavioral: Negative for decreased concentration or agitation other than above       Objective:   Physical Exam BP 132/80  Pulse 91  Temp(Src) 98.1 F (36.7 C) (Oral)  Ht 6\' 1"  (1.854 m)  Wt 241 lb 8 oz (109.544 kg)  BMI 31.87 kg/m2  SpO2 96% VS noted,  Constitutional: Pt appears well-developed, well-nourished/obese.  HENT: Head:  NCAT.  Right Ear: External ear normal.  Left Ear: External ear normal.  Eyes: . Pupils are equal, round, and reactive to light. Conjunctivae and EOM are normal Neck: Normal range of motion. Neck supple.  Cardiovascular: Normal rate and regular rhythm.   Pulmonary/Chest: Effort normal and breath sounds normal.  Abd:  Soft, NT, ND, + BS Neurological: Pt is alert. + baseline confused , motor grossly intact Skin: Skin is warm. No rash, LE trace edema bilat Psychiatric: Pt behavior is normal. No agitation.     Assessment & Plan:

## 2014-02-24 NOTE — Assessment & Plan Note (Signed)
stable overall by history and exam, recent data reviewed with pt, and pt to continue medical treatment as before,  to f/u any worsening symptoms or concerns BP Readings from Last 3 Encounters:  02/24/14 132/80  01/13/14 122/70  12/16/13 132/90

## 2014-02-24 NOTE — Patient Instructions (Addendum)
OK to stop the oxygen at home; will fax order to [facility] for this  Please continue all other medications as before, and refills have been done if requested. Please have the pharmacy call with any other refills you may need.  Please keep your appointments with your specialists as you have planned  You can also try Allegra OTC as needed for allergies

## 2014-02-25 ENCOUNTER — Telehealth: Payer: Self-pay | Admitting: Internal Medicine

## 2014-02-25 NOTE — Telephone Encounter (Signed)
Relevant patient education mailed to patient.  

## 2014-02-26 DIAGNOSIS — M2060 Acquired deformities of toe(s), unspecified, unspecified foot: Secondary | ICD-10-CM | POA: Diagnosis not present

## 2014-02-26 DIAGNOSIS — E119 Type 2 diabetes mellitus without complications: Secondary | ICD-10-CM | POA: Diagnosis not present

## 2014-02-26 DIAGNOSIS — L608 Other nail disorders: Secondary | ICD-10-CM | POA: Diagnosis not present

## 2014-02-26 DIAGNOSIS — L84 Corns and callosities: Secondary | ICD-10-CM | POA: Diagnosis not present

## 2014-02-28 DIAGNOSIS — N186 End stage renal disease: Secondary | ICD-10-CM | POA: Diagnosis not present

## 2014-03-02 DIAGNOSIS — N2581 Secondary hyperparathyroidism of renal origin: Secondary | ICD-10-CM | POA: Diagnosis not present

## 2014-03-02 DIAGNOSIS — D631 Anemia in chronic kidney disease: Secondary | ICD-10-CM | POA: Diagnosis not present

## 2014-03-02 DIAGNOSIS — N186 End stage renal disease: Secondary | ICD-10-CM | POA: Diagnosis not present

## 2014-03-02 DIAGNOSIS — E1129 Type 2 diabetes mellitus with other diabetic kidney complication: Secondary | ICD-10-CM | POA: Diagnosis not present

## 2014-03-10 ENCOUNTER — Encounter (HOSPITAL_COMMUNITY): Payer: Self-pay

## 2014-03-10 ENCOUNTER — Ambulatory Visit (HOSPITAL_COMMUNITY)
Admission: RE | Admit: 2014-03-10 | Discharge: 2014-03-10 | Disposition: A | Discharge status: Home / Self Care | Payer: Medicare Other | Source: Ambulatory Visit | Attending: Internal Medicine | Admitting: Internal Medicine

## 2014-03-10 VITALS — BP 164/78 | HR 83 | Wt 241.1 lb

## 2014-03-10 DIAGNOSIS — Z992 Dependence on renal dialysis: Secondary | ICD-10-CM | POA: Insufficient documentation

## 2014-03-10 DIAGNOSIS — K219 Gastro-esophageal reflux disease without esophagitis: Secondary | ICD-10-CM | POA: Diagnosis not present

## 2014-03-10 DIAGNOSIS — J449 Chronic obstructive pulmonary disease, unspecified: Secondary | ICD-10-CM | POA: Insufficient documentation

## 2014-03-10 DIAGNOSIS — I10 Essential (primary) hypertension: Secondary | ICD-10-CM | POA: Diagnosis not present

## 2014-03-10 DIAGNOSIS — Z9861 Coronary angioplasty status: Secondary | ICD-10-CM | POA: Diagnosis not present

## 2014-03-10 DIAGNOSIS — I5022 Chronic systolic (congestive) heart failure: Secondary | ICD-10-CM | POA: Insufficient documentation

## 2014-03-10 DIAGNOSIS — F3289 Other specified depressive episodes: Secondary | ICD-10-CM | POA: Diagnosis not present

## 2014-03-10 DIAGNOSIS — G4733 Obstructive sleep apnea (adult) (pediatric): Secondary | ICD-10-CM | POA: Diagnosis not present

## 2014-03-10 DIAGNOSIS — I120 Hypertensive chronic kidney disease with stage 5 chronic kidney disease or end stage renal disease: Secondary | ICD-10-CM | POA: Insufficient documentation

## 2014-03-10 DIAGNOSIS — Z7982 Long term (current) use of aspirin: Secondary | ICD-10-CM | POA: Insufficient documentation

## 2014-03-10 DIAGNOSIS — I2510 Atherosclerotic heart disease of native coronary artery without angina pectoris: Secondary | ICD-10-CM | POA: Insufficient documentation

## 2014-03-10 DIAGNOSIS — E119 Type 2 diabetes mellitus without complications: Secondary | ICD-10-CM | POA: Diagnosis not present

## 2014-03-10 DIAGNOSIS — N186 End stage renal disease: Secondary | ICD-10-CM | POA: Insufficient documentation

## 2014-03-10 DIAGNOSIS — J4489 Other specified chronic obstructive pulmonary disease: Secondary | ICD-10-CM | POA: Insufficient documentation

## 2014-03-10 DIAGNOSIS — M109 Gout, unspecified: Secondary | ICD-10-CM | POA: Diagnosis not present

## 2014-03-10 DIAGNOSIS — Z9989 Dependence on other enabling machines and devices: Secondary | ICD-10-CM | POA: Insufficient documentation

## 2014-03-10 DIAGNOSIS — E669 Obesity, unspecified: Secondary | ICD-10-CM | POA: Diagnosis not present

## 2014-03-10 DIAGNOSIS — F329 Major depressive disorder, single episode, unspecified: Secondary | ICD-10-CM | POA: Diagnosis not present

## 2014-03-10 DIAGNOSIS — Z9981 Dependence on supplemental oxygen: Secondary | ICD-10-CM | POA: Insufficient documentation

## 2014-03-10 MED ORDER — AMLODIPINE BESYLATE 10 MG PO TABS: 5.0000 mg | ORAL_TABLET | Freq: Every day | ORAL | Status: DC

## 2014-03-10 MED ORDER — CARVEDILOL 25 MG PO TABS: 12.5000 mg | ORAL_TABLET | Freq: Two times a day (BID) | ORAL | Status: DC

## 2014-03-10 NOTE — Progress Notes (Signed)
Patient ID: [name], male   DOB: 06/06/1951, 63 y.o.   MRN: [id number]  PCP: Dr [name] Nephrologist: Dr Mercy Moore  HPI: [name] is 63 year old male with PMH: obesity, DM, COPD on nighttime oxygen, sleep apnea on CPAP, ESRD- HD, CAD, S/P Taxus drug-eluting stent to the right coronary in September [id number], chronic systolic/diastolic heart failure ECHO 09/2011 EF 35-40% grade 2, KL (Unable to tolerate statins so placed on Welchol)  Had RHC in 2/11 which showed low pressures (PCWP = 2) , PA 26/16 (22)  and normal cardiac output.  Echo 12/12: EF 55% Myoview 10/15/12: EF 33%. LV Wall Motion: There is global hypokinesis. The LV is markedly enlarged. Small fixed apical defect.  cMRI 02/19/13: EF 37% dilated LV. diffuse HK  Evaluated at [facility] for kidney transplant but he was not felt to be candidate (11/2012) due to poor mobility, DM, and coronary disease.   CPX 06/04/13   Resting HR: 76 Peak HR: 109 (70% age predicted max HR) BP rest: 107/70 BP peak: 154/69 Peak VO2: 11.8 (53.7% predicted peak VO2) - when corrected to ibw pVO2 15.7 VE/VCO2 slope: 28.8 OUES: 1.90 Peak RER: 1.06  Ve/MVV 34.7%  Follow up: Says he feels so-so. During HD BP drops very low and often has to come off early. On non-dialysis days BP can go up as high as 160-170. Feels tired and SOB with mild activity. If he takes BP meds on non-HD days feels wiped out. Has been taking carvedilol 25 qhs and norvasc 10 qhs. Dialysis has been keeping his edema down. No orthopnea or PND. Weight stable.    Past Medical History  Diagnosis Date  . Hyperthyroidism   . GASTROENTERITIS, VIRAL 10/14/2009  . ONYCHOMYCOSIS, TOENAILS 12/26/2007  . GOITER, MULTINODULAR 12/26/2007  . HYPERTHYROIDISM 02/02/2010  . DIABETES MELLITUS, TYPE II 02/01/2010  . DYSLIPIDEMIA 06/18/2007  . GOUT 06/18/2007  . Hypocalcemia 06/07/2010  . DEMENTIA 09/02/2009  . DEPRESSION 10/14/2009  . PERIPHERAL NEUROPATHY 06/18/2007  . HYPERTENSION 06/18/2007  . CONGESTIVE HEART FAILURE  06/18/2007  . DIASTOLIC HEART FAILURE, CHRONIC 02/06/2009  . Unspecified hypotension 01/30/2010  . PULMONARY NODULE, RIGHT LOWER LOBE 06/08/2009  . GERD 06/18/2007  . ESRD 08/04/2010  . BENIGN PROSTATIC HYPERTROPHY 10/14/2009  . GYNECOMASTIA 07/17/2010  . NECK PAIN 07/31/2010  . FOOT PAIN 08/12/2008  . DIZZINESS 07/17/2010  . Other malaise and fatigue 11/24/2009  . GAIT DISTURBANCE 03/03/2010  . DYSPNEA 10/29/2008  . CHEST PAIN 03/29/2010  . TRANSAMINASES, SERUM, ELEVATED 02/01/2010  . COLONIC POLYPS, HX OF 10/14/2009  . Anemia 06/16/2011  . Ischemic cardiomyopathy 06/16/2011  . Hyperparathyroidism, secondary 06/16/2011  . OSA on CPAP 10/16/2011  . Hyperlipidemia 10/16/2011  . CAD, NATIVE VESSEL 02/06/2009    saw Dr. Missy Sabins last jan 2013  . Sleep apnea     cpap machine and o2  . Allergic rhinitis, cause unspecified 02/24/2014    Current Outpatient Prescriptions  Medication Sig Dispense Refill  . allopurinol (ZYLOPRIM) 100 MG tablet Take 1 tablet (100 mg total) by mouth daily.  30 tablet  11  . amitriptyline (ELAVIL) 50 MG tablet Take 50 mg by mouth daily as needed (rest).       [name] amLODipine (NORVASC) 10 MG tablet Take 10 mg by mouth daily.      [name] aspirin 81 MG EC tablet Take 81 mg by mouth daily.       . calcium-vitamin D (OSCAL WITH D) 500-200 MG-UNIT per tablet Take 3 tablets by mouth 3 (three) times daily  with meals.       . carvedilol (COREG) 25 MG tablet Take 12.5 mg by mouth daily.      . cinacalcet (SENSIPAR) 60 MG tablet Take 60 mg by mouth 2 (two) times daily.       . citalopram (CELEXA) 20 MG tablet Take 1 tablet (20 mg total) by mouth daily.  90 tablet  3  . clomiPHENE (CLOMID) 50 MG tablet 1/4 tab daily  10 tablet  11  . colesevelam (WELCHOL) 625 MG tablet Take 3 tablets (1,875 mg total) by mouth 2 (two) times daily with a meal.  180 tablet  11  . gabapentin (NEURONTIN) 300 MG capsule Take 1 capsule (300 mg total) by mouth at bedtime as needed. For pain  90 capsule  1  . glucose blood  (FREESTYLE TEST STRIPS) test strip 1 each by Other route daily. And lancets 1/day 250.40  100 each  12  . lanthanum (FOSRENOL) 1000 MG chewable tablet Chew 1,000 mg by mouth 2 times daily at 12 noon and 4 pm.      . multivitamin (RENA-VIT) TABS tablet Take 1 tablet by mouth daily.        . ondansetron (ZOFRAN) 4 MG tablet Take 1 tablet (4 mg total) by mouth every 8 (eight) hours as needed for nausea or vomiting.  30 tablet  0  . ranitidine (ZANTAC) 300 MG tablet Take 300 mg by mouth at bedtime.       No current facility-administered medications for this encounter.    Filed Vitals:   03/10/14 1500  BP: 164/78  Pulse: 83    PHYSICAL EXAM: General:  Fatigued appearing. No resp difficulty Wife present HEENT: normal Neck: supple. JVP flat. Carotids 2+ bilaterally; no bruits. No lymphadenopathy or thryomegaly appreciated. Cor: PMI normal. Regular rate & rhythm. No rubs, gallops 2/6 SEM LUSB. L chest perm-cath Lungs: clear Abdomen: obese  soft, nontender, nondistended. No hepatosplenomegaly. No bruits or masses. Good bowel sounds. Extremities: no cyanosis, clubbing, rash, edema. + grafts in both UEs  Neuro: alert & orientedx3, cranial nerves grossly intact. Moves all 4 extremities w/o difficulty. Affect pleasant.   ASSESSMENT & PLAN:  1. Chronic Systolic Heart Fatigue:  NICM, EF 37% per c-MRI. Chronic NYHA IIIB.  Volume status stable with HD. BP very labile will have him take carvedilol 12.5 mg bid only on non-HD days.  Not on ACE or ARB due to ESRD. BP too low for Bidil.  Needs repeat echo.     2. HTN - very labile. Have instructed him to take carvedilol 12.5 bid only on no HD days Cannot tolerate on HD days. Take norvasc 5mg  qhs prn if sbp >= 130  3. ESRD-  Dry Weight 111.5 kg HD Tues/Thur/Sat  Follow up in 6 months  [name] 3:26 PM

## 2014-03-10 NOTE — Patient Instructions (Signed)
Take Carvedilol 12.5 mg Twice daily ON NON-HD DAYS  Take Amlodipine (Norvasc) 5 mg ONLY IF SBP IS GREATER THAN OR EQUAL TO 130  Your physician has requested that you have an echocardiogram. Echocardiography is a painless test that uses sound waves to create images of your heart. It provides your doctor with information about the size and shape of your heart and how well your heart's chambers and valves are working. This procedure takes approximately one hour. There are no restrictions for this procedure.  We will contact you in 6 months to schedule your next appointment.

## 2014-03-16 ENCOUNTER — Other Ambulatory Visit: Payer: Self-pay | Admitting: Nephrology

## 2014-03-17 DIAGNOSIS — IMO0002 Reserved for concepts with insufficient information to code with codable children: Secondary | ICD-10-CM | POA: Diagnosis not present

## 2014-03-17 DIAGNOSIS — M1710 Unilateral primary osteoarthritis, unspecified knee: Secondary | ICD-10-CM | POA: Diagnosis not present

## 2014-03-26 ENCOUNTER — Encounter (INDEPENDENT_AMBULATORY_CARE_PROVIDER_SITE_OTHER): Payer: Self-pay

## 2014-03-26 ENCOUNTER — Ambulatory Visit (HOSPITAL_COMMUNITY)
Admission: RE | Admit: 2014-03-26 | Discharge: 2014-03-26 | Disposition: A | Discharge status: Home / Self Care | Payer: Medicare Other | Source: Ambulatory Visit | Attending: Internal Medicine | Admitting: Internal Medicine

## 2014-03-26 DIAGNOSIS — I10 Essential (primary) hypertension: Secondary | ICD-10-CM | POA: Diagnosis not present

## 2014-03-26 DIAGNOSIS — R0989 Other specified symptoms and signs involving the circulatory and respiratory systems: Secondary | ICD-10-CM | POA: Diagnosis not present

## 2014-03-26 DIAGNOSIS — I379 Nonrheumatic pulmonary valve disorder, unspecified: Secondary | ICD-10-CM | POA: Diagnosis not present

## 2014-03-26 DIAGNOSIS — I509 Heart failure, unspecified: Secondary | ICD-10-CM | POA: Insufficient documentation

## 2014-03-26 DIAGNOSIS — I5022 Chronic systolic (congestive) heart failure: Secondary | ICD-10-CM

## 2014-03-26 DIAGNOSIS — I517 Cardiomegaly: Secondary | ICD-10-CM

## 2014-03-26 DIAGNOSIS — I252 Old myocardial infarction: Secondary | ICD-10-CM | POA: Diagnosis not present

## 2014-03-26 DIAGNOSIS — E785 Hyperlipidemia, unspecified: Secondary | ICD-10-CM | POA: Diagnosis not present

## 2014-03-26 DIAGNOSIS — Z87891 Personal history of nicotine dependence: Secondary | ICD-10-CM | POA: Diagnosis not present

## 2014-03-26 DIAGNOSIS — E119 Type 2 diabetes mellitus without complications: Secondary | ICD-10-CM | POA: Diagnosis not present

## 2014-03-26 DIAGNOSIS — I079 Rheumatic tricuspid valve disease, unspecified: Secondary | ICD-10-CM | POA: Insufficient documentation

## 2014-03-26 DIAGNOSIS — I059 Rheumatic mitral valve disease, unspecified: Secondary | ICD-10-CM | POA: Diagnosis not present

## 2014-03-26 DIAGNOSIS — R0609 Other forms of dyspnea: Secondary | ICD-10-CM | POA: Insufficient documentation

## 2014-03-26 NOTE — Progress Notes (Signed)
  Echocardiogram 2D Echocardiogram has been performed.  [name], [name] FRANCES 03/26/2014, 2:51 PM

## 2014-03-30 DIAGNOSIS — N186 End stage renal disease: Secondary | ICD-10-CM | POA: Diagnosis not present

## 2014-04-01 DIAGNOSIS — N186 End stage renal disease: Secondary | ICD-10-CM | POA: Diagnosis not present

## 2014-04-17 IMAGING — CR DG CHEST 2V
2 series · 2 of 2 positions shown · non-contrast
Comparison: 02/11/2013

CLINICAL DATA: Chest pain, cough, fever

EXAM:
CHEST  2 VIEW

[view not recorded (1 of 2)]
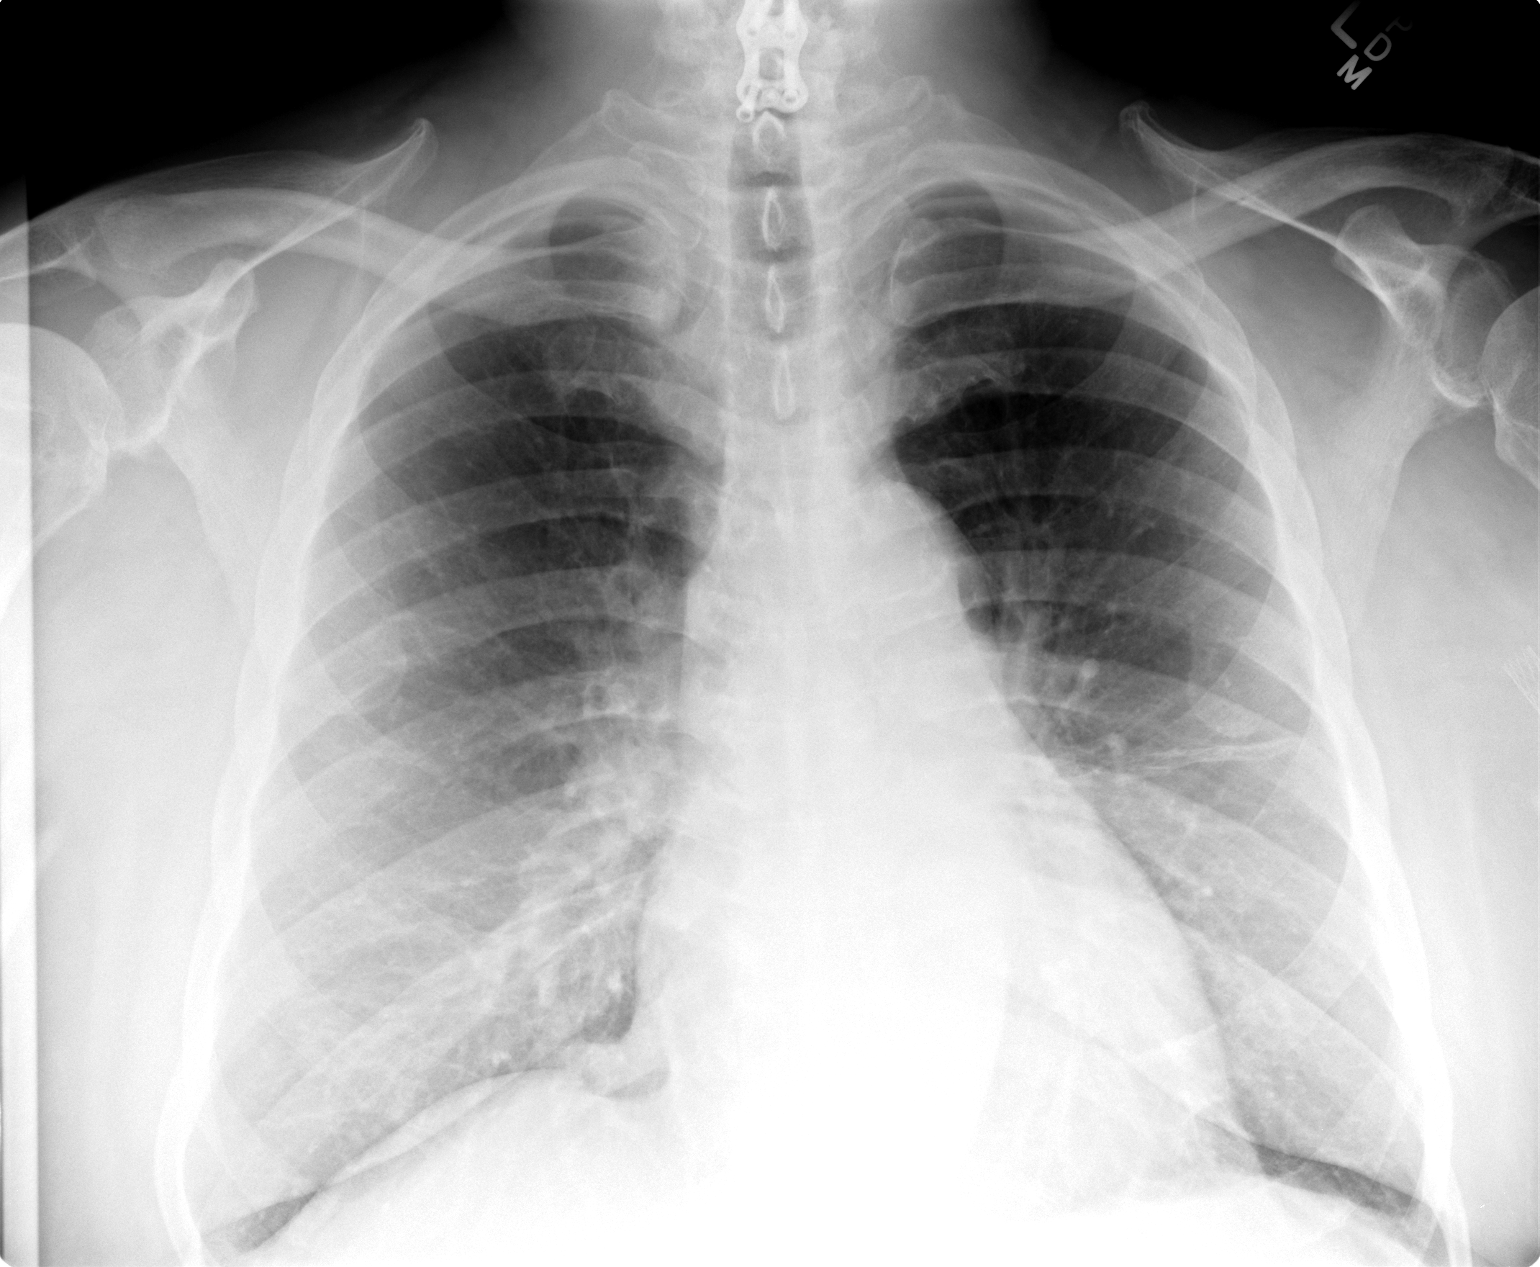

[view not recorded (2 of 2)]
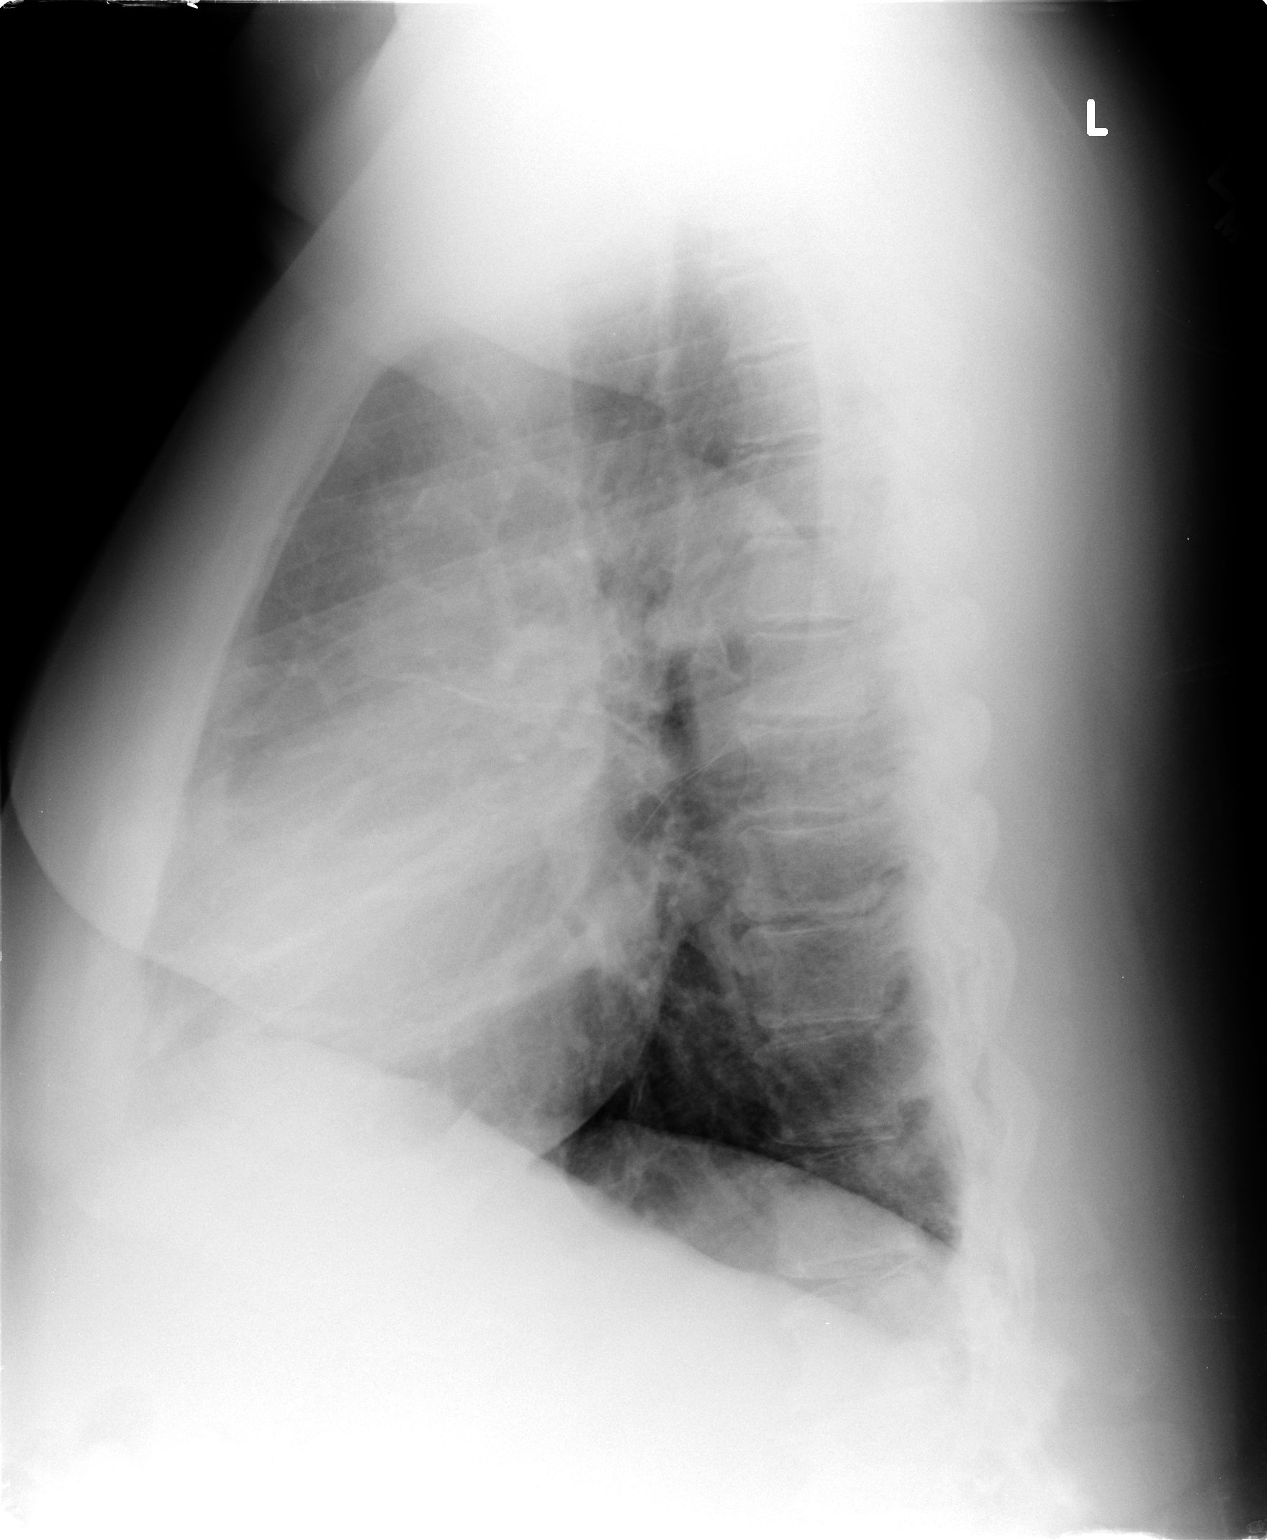

[2 of 2 positions shown; findings below may reference images not displayed]

FINDINGS: Borderline cardiomegaly again noted. No acute infiltrate or pleural
effusion. No pulmonary edema. Metallic fixation plate cervical spine
again noted. Linear atelectasis or scarring in left midlung. Central
mild bronchitic changes. Bony thorax is stable.
IMPRESSION: No acute infiltrate or pulmonary edema. Linear atelectasis or
scarring left midlung. Central mild bronchitic changes.

## 2014-04-21 ENCOUNTER — Encounter: Payer: Self-pay | Admitting: Endocrinology

## 2014-04-21 ENCOUNTER — Ambulatory Visit (INDEPENDENT_AMBULATORY_CARE_PROVIDER_SITE_OTHER): Payer: Medicare Other | Admitting: Endocrinology

## 2014-04-21 VITALS — BP 118/74 | HR 83 | Temp 98.2°F | Ht 73.0 in | Wt 242.0 lb

## 2014-04-21 DIAGNOSIS — E042 Nontoxic multinodular goiter: Secondary | ICD-10-CM | POA: Diagnosis not present

## 2014-04-21 DIAGNOSIS — E119 Type 2 diabetes mellitus without complications: Secondary | ICD-10-CM

## 2014-04-21 DIAGNOSIS — E0590 Thyrotoxicosis, unspecified without thyrotoxic crisis or storm: Secondary | ICD-10-CM

## 2014-04-21 LAB — HEMOGLOBIN A1C: Hgb A1c MFr Bld: 7.1 % — ABNORMAL HIGH (ref 4.6–6.5)

## 2014-04-21 NOTE — Patient Instructions (Addendum)
blood tests are being requested for you today.  We'll contact you with results. Please come back for a follow-up appointment in 4 months.

## 2014-04-21 NOTE — Progress Notes (Signed)
Subjective:    Patient ID: [name], male    DOB: 1951/09/17, 63 y.o.   MRN: [id number]  HPI The state of at least three ongoing medical problems is addressed today: Pt returns for f/u of type 2 DM (dx'ed 2003, on a routine blood test; he has moderate neuropathy of the lower extremities; he has associated renal failure and CAD; he took insulin from 2010 until 2013; he has not recently required any medication (except he takes welchol).  no cbg record, but states cbg's are well-controlled.  Hyperthyroidism, due to a multinodular goiter: he has not recently required tapazole.  He still has fatigue.  Hypogonadism was noted at dialysis.  He does not take clomid, but wants to resume. Past Medical History  Diagnosis Date  . Hyperthyroidism   . GASTROENTERITIS, VIRAL 10/14/2009  . ONYCHOMYCOSIS, TOENAILS 12/26/2007  . GOITER, MULTINODULAR 12/26/2007  . HYPERTHYROIDISM 02/02/2010  . DIABETES MELLITUS, TYPE II 02/01/2010  . DYSLIPIDEMIA 06/18/2007  . GOUT 06/18/2007  . Hypocalcemia 06/07/2010  . DEMENTIA 09/02/2009  . DEPRESSION 10/14/2009  . PERIPHERAL NEUROPATHY 06/18/2007  . HYPERTENSION 06/18/2007  . CONGESTIVE HEART FAILURE 06/18/2007  . DIASTOLIC HEART FAILURE, CHRONIC 02/06/2009  . Unspecified hypotension 01/30/2010  . PULMONARY NODULE, RIGHT LOWER LOBE 06/08/2009  . GERD 06/18/2007  . ESRD 08/04/2010  . BENIGN PROSTATIC HYPERTROPHY 10/14/2009  . GYNECOMASTIA 07/17/2010  . NECK PAIN 07/31/2010  . FOOT PAIN 08/12/2008  . DIZZINESS 07/17/2010  . Other malaise and fatigue 11/24/2009  . GAIT DISTURBANCE 03/03/2010  . DYSPNEA 10/29/2008  . CHEST PAIN 03/29/2010  . TRANSAMINASES, SERUM, ELEVATED 02/01/2010  . COLONIC POLYPS, HX OF 10/14/2009  . Anemia 06/16/2011  . Ischemic cardiomyopathy 06/16/2011  . Hyperparathyroidism, secondary 06/16/2011  . OSA on CPAP 10/16/2011  . Hyperlipidemia 10/16/2011  . CAD, NATIVE VESSEL 02/06/2009    saw Dr. Missy Sabins last jan 2013  . Sleep apnea     cpap machine and o2  .  Allergic rhinitis, cause unspecified 02/24/2014    Past Surgical History  Procedure Laterality Date  . Stent 06/11/08    . Neck sugury  2/09  . Back surgury  1998  . Givens capsule study  09/29/2011    Procedure: GIVENS CAPSULE STUDY;  Surgeon: Missy Sabins, MD;  Location: [place];  Service: Endoscopy;  Laterality: Left;  . Esophagogastroduodenoscopy  09/28/2011    Procedure: ESOPHAGOGASTRODUODENOSCOPY (EGD);  Surgeon: Missy Sabins, MD;  Location: [facility] ENDOSCOPY;  Service: Endoscopy;  Laterality: N/A;  . Givens capsule study  09/30/2011    Procedure: GIVENS CAPSULE STUDY;  Surgeon: Jeryl Columbia, MD;  Location: [facility] ENDOSCOPY;  Service: Endoscopy;  Laterality: N/A;  . Cholecystectomy    . Cardiac catheterization    . Tonsillectomy    . Av fistula placement  11/07/2011    Procedure: INSERTION OF ARTERIOVENOUS (AV) GORE-TEX GRAFT ARM;  Surgeon: Tinnie Gens, MD;  Location: [place];  Service: Vascular;  Laterality: Left;  . Dialysis fistula creation  2009    right  . Insertion of dialysis catheter  2014    right side  . Insertion of dialysis catheter Left 02/11/2013    Procedure: INSERTION OF DIALYSIS CATHETER;  Surgeon: Conrad [place], MD;  Location: [place];  Service: Vascular;  Laterality: Left;  Ultrasound guided  . Removal of a dialysis catheter Right 02/11/2013    Procedure: REMOVAL OF A DIALYSIS CATHETER;  Surgeon: Conrad [place], MD;  Location: [place];  Service: Vascular;  Laterality: Right;  .  Bascilic vein transposition Right 02/27/2013    Procedure: BASCILIC VEIN TRANSPOSITION;  Surgeon: Mal Misty, MD;  Location: [place];  Service: Vascular;  Laterality: Right;  Right Basilic Vein Transposition     History   Social History  . Marital Status: Married    Spouse Name: N/A    Number of Children: N/A  . Years of Education: N/A   Occupational History  . disabled   . formerly Lawyer for [employer].    Social History Main Topics  . Smoking status: Former Smoker -- 1.00  packs/day for 25 years    Types: Cigarettes  . Smokeless tobacco: Never Used     Comment: Quit smoking 2007 Smoked x 25 years 1/2 ppd.  . Alcohol Use: Yes     Comment: occassional   . Drug Use: No  . Sexual Activity: Not on file   Other Topics Concern  . Not on file   Social History Narrative  . No narrative on file    Current Outpatient Prescriptions on File Prior to Visit  Medication Sig Dispense Refill  . allopurinol (ZYLOPRIM) 100 MG tablet Take 1 tablet (100 mg total) by mouth daily.  30 tablet  11  . amitriptyline (ELAVIL) 50 MG tablet Take 50 mg by mouth daily as needed (rest).       [name] amLODipine (NORVASC) 10 MG tablet Take 0.5 tablets (5 mg total) by mouth daily. ONLY IF SBP GREATER THAN 130      . aspirin 81 MG EC tablet Take 81 mg by mouth daily.       . calcium-vitamin D (OSCAL WITH D) 500-200 MG-UNIT per tablet Take 3 tablets by mouth 3 (three) times daily with meals.       . carvedilol (COREG) 25 MG tablet Take 0.5 tablets (12.5 mg total) by mouth 2 (two) times daily with a meal. Only on non-HD days      . cinacalcet (SENSIPAR) 60 MG tablet Take 60 mg by mouth 2 (two) times daily.       . citalopram (CELEXA) 20 MG tablet Take 1 tablet (20 mg total) by mouth daily.  90 tablet  3  . colesevelam (WELCHOL) 625 MG tablet Take 3 tablets (1,875 mg total) by mouth 2 (two) times daily with a meal.  180 tablet  11  . gabapentin (NEURONTIN) 300 MG capsule Take 1 capsule (300 mg total) by mouth at bedtime as needed. For pain  90 capsule  1  . glucose blood (FREESTYLE TEST STRIPS) test strip 1 each by Other route daily. And lancets 1/day 250.40  100 each  12  . lanthanum (FOSRENOL) 1000 MG chewable tablet Chew 1,000 mg by mouth 2 times daily at 12 noon and 4 pm.      . multivitamin (RENA-VIT) TABS tablet Take 1 tablet by mouth daily.        . ondansetron (ZOFRAN) 4 MG tablet Take 1 tablet (4 mg total) by mouth every 8 (eight) hours as needed for nausea or vomiting.  30 tablet  0  .  ranitidine (ZANTAC) 300 MG tablet Take 300 mg by mouth at bedtime.       No current facility-administered medications on file prior to visit.    Allergies  Allergen Reactions  . Cephalexin Other (See Comments)    Tongue swelling  . Statins     Weak muscles    Family History  Problem Relation Age of Onset  . Heart disease Father   . Heart  disease Sister     BP 118/74  Pulse 83  Temp(Src) 98.2 F (36.8 C) (Oral)  Ht 6\' 1"  (1.854 m)  Wt 242 lb (109.77 kg)  BMI 31.93 kg/m2  SpO2 96%   Review of Systems He has chronic generalized muscle weakness.  ED sxs persist.    Objective:   Physical Exam Pulses: dorsalis pedis intact bilat.  Feet: no deformity. feet are of normal color and temp. no edema. There is bilateral onychomycosis.  Skin: no ulcer on the feet.  Neuro: sensation is intact to touch on the feet, but decreased from normal.   Lab Results  Component Value Date   HGBA1C 7.1* 04/21/2014   Lab Results  Component Value Date   TESTOSTERONE 622 12/16/2013   Lab Results  Component Value Date   TSH 0.05* 04/21/2014   T4TOTAL 5.6 07/19/2008        Assessment & Plan:  Hypogonadism: we can't rx, due to elevated PSA. DM: mild exacerbation Hyperthyroidism, recurrent  Patient is advised the following: Patient Instructions  blood tests are being requested for you today.  We'll contact you with results. Please come back for a follow-up appointment in 4 months.     resume tapazole we'l follow a1c for now.

## 2014-04-22 ENCOUNTER — Telehealth: Payer: Self-pay | Admitting: Endocrinology

## 2014-04-22 DIAGNOSIS — E1129 Type 2 diabetes mellitus with other diabetic kidney complication: Secondary | ICD-10-CM | POA: Diagnosis not present

## 2014-04-22 LAB — TSH: TSH: 0.05 u[IU]/mL — AB (ref 0.35–4.50)

## 2014-04-22 MED ORDER — METHIMAZOLE 5 MG PO TABS: 5.0000 mg | ORAL_TABLET | Freq: Every day | ORAL | Status: DC

## 2014-04-22 NOTE — Telephone Encounter (Signed)
please call patient: Correction: Please resume methimazole. i have sent a prescription to your pharmacy if ever you have fever while taking methimazole, stop it and call us, because of the risk of a rare side-effect. Please come back for a follow-up appointment in 3 months

## 2014-04-23 NOTE — Telephone Encounter (Signed)
Pt wife advised 

## 2014-04-27 ENCOUNTER — Other Ambulatory Visit (HOSPITAL_COMMUNITY): Payer: Medicare Other

## 2014-04-27 ENCOUNTER — Ambulatory Visit: Payer: Medicare Other | Admitting: Vascular Surgery

## 2014-04-28 ENCOUNTER — Ambulatory Visit: Payer: Medicare Other | Admitting: Nutrition

## 2014-04-30 DIAGNOSIS — L608 Other nail disorders: Secondary | ICD-10-CM | POA: Diagnosis not present

## 2014-04-30 DIAGNOSIS — E119 Type 2 diabetes mellitus without complications: Secondary | ICD-10-CM | POA: Diagnosis not present

## 2014-04-30 DIAGNOSIS — L84 Corns and callosities: Secondary | ICD-10-CM | POA: Diagnosis not present

## 2014-04-30 DIAGNOSIS — N186 End stage renal disease: Secondary | ICD-10-CM | POA: Diagnosis not present

## 2014-05-01 DIAGNOSIS — N186 End stage renal disease: Secondary | ICD-10-CM | POA: Diagnosis not present

## 2014-05-01 DIAGNOSIS — D631 Anemia in chronic kidney disease: Secondary | ICD-10-CM | POA: Diagnosis not present

## 2014-05-01 DIAGNOSIS — E1129 Type 2 diabetes mellitus with other diabetic kidney complication: Secondary | ICD-10-CM | POA: Diagnosis not present

## 2014-05-01 DIAGNOSIS — N039 Chronic nephritic syndrome with unspecified morphologic changes: Secondary | ICD-10-CM | POA: Diagnosis not present

## 2014-05-05 DIAGNOSIS — M1710 Unilateral primary osteoarthritis, unspecified knee: Secondary | ICD-10-CM | POA: Diagnosis not present

## 2014-05-12 DIAGNOSIS — N186 End stage renal disease: Secondary | ICD-10-CM | POA: Diagnosis not present

## 2014-05-12 DIAGNOSIS — I871 Compression of vein: Secondary | ICD-10-CM | POA: Diagnosis not present

## 2014-05-12 DIAGNOSIS — M1710 Unilateral primary osteoarthritis, unspecified knee: Secondary | ICD-10-CM | POA: Diagnosis not present

## 2014-05-12 DIAGNOSIS — T82898A Other specified complication of vascular prosthetic devices, implants and grafts, initial encounter: Secondary | ICD-10-CM | POA: Diagnosis not present

## 2014-05-19 DIAGNOSIS — M1710 Unilateral primary osteoarthritis, unspecified knee: Secondary | ICD-10-CM | POA: Diagnosis not present

## 2014-05-25 DIAGNOSIS — M6281 Muscle weakness (generalized): Secondary | ICD-10-CM | POA: Diagnosis not present

## 2014-05-28 DIAGNOSIS — I871 Compression of vein: Secondary | ICD-10-CM | POA: Diagnosis not present

## 2014-05-28 DIAGNOSIS — T82898A Other specified complication of vascular prosthetic devices, implants and grafts, initial encounter: Secondary | ICD-10-CM | POA: Diagnosis not present

## 2014-05-28 DIAGNOSIS — N186 End stage renal disease: Secondary | ICD-10-CM | POA: Diagnosis not present

## 2014-05-31 DIAGNOSIS — N401 Enlarged prostate with lower urinary tract symptoms: Secondary | ICD-10-CM | POA: Diagnosis not present

## 2014-05-31 DIAGNOSIS — N186 End stage renal disease: Secondary | ICD-10-CM | POA: Diagnosis not present

## 2014-05-31 DIAGNOSIS — N139 Obstructive and reflux uropathy, unspecified: Secondary | ICD-10-CM | POA: Diagnosis not present

## 2014-05-31 DIAGNOSIS — R972 Elevated prostate specific antigen [PSA]: Secondary | ICD-10-CM | POA: Diagnosis not present

## 2014-05-31 DIAGNOSIS — N138 Other obstructive and reflux uropathy: Secondary | ICD-10-CM | POA: Diagnosis not present

## 2014-06-01 DIAGNOSIS — D631 Anemia in chronic kidney disease: Secondary | ICD-10-CM | POA: Diagnosis not present

## 2014-06-01 DIAGNOSIS — N039 Chronic nephritic syndrome with unspecified morphologic changes: Secondary | ICD-10-CM | POA: Diagnosis not present

## 2014-06-01 DIAGNOSIS — E1129 Type 2 diabetes mellitus with other diabetic kidney complication: Secondary | ICD-10-CM | POA: Diagnosis not present

## 2014-06-01 DIAGNOSIS — N186 End stage renal disease: Secondary | ICD-10-CM | POA: Diagnosis not present

## 2014-06-02 ENCOUNTER — Ambulatory Visit: Payer: Medicare Other | Admitting: Endocrinology

## 2014-06-02 DIAGNOSIS — M4712 Other spondylosis with myelopathy, cervical region: Secondary | ICD-10-CM | POA: Diagnosis not present

## 2014-06-03 DIAGNOSIS — R5381 Other malaise: Secondary | ICD-10-CM | POA: Diagnosis not present

## 2014-06-11 DIAGNOSIS — T82898A Other specified complication of vascular prosthetic devices, implants and grafts, initial encounter: Secondary | ICD-10-CM | POA: Diagnosis not present

## 2014-06-11 DIAGNOSIS — N186 End stage renal disease: Secondary | ICD-10-CM | POA: Diagnosis not present

## 2014-06-11 DIAGNOSIS — I871 Compression of vein: Secondary | ICD-10-CM | POA: Diagnosis not present

## 2014-06-28 DIAGNOSIS — G609 Hereditary and idiopathic neuropathy, unspecified: Secondary | ICD-10-CM | POA: Diagnosis not present

## 2014-06-28 DIAGNOSIS — G5600 Carpal tunnel syndrome, unspecified upper limb: Secondary | ICD-10-CM | POA: Diagnosis not present

## 2014-06-28 DIAGNOSIS — M5412 Radiculopathy, cervical region: Secondary | ICD-10-CM | POA: Diagnosis not present

## 2014-06-30 DIAGNOSIS — N186 End stage renal disease: Secondary | ICD-10-CM | POA: Diagnosis not present

## 2014-07-01 DIAGNOSIS — N186 End stage renal disease: Secondary | ICD-10-CM | POA: Diagnosis not present

## 2014-07-01 DIAGNOSIS — Z23 Encounter for immunization: Secondary | ICD-10-CM | POA: Diagnosis not present

## 2014-07-01 DIAGNOSIS — E119 Type 2 diabetes mellitus without complications: Secondary | ICD-10-CM | POA: Diagnosis not present

## 2014-07-01 DIAGNOSIS — D631 Anemia in chronic kidney disease: Secondary | ICD-10-CM | POA: Diagnosis not present

## 2014-07-02 DIAGNOSIS — L84 Corns and callosities: Secondary | ICD-10-CM | POA: Diagnosis not present

## 2014-07-02 DIAGNOSIS — E119 Type 2 diabetes mellitus without complications: Secondary | ICD-10-CM | POA: Diagnosis not present

## 2014-07-02 DIAGNOSIS — L602 Onychogryphosis: Secondary | ICD-10-CM | POA: Diagnosis not present

## 2014-07-12 DIAGNOSIS — R1310 Dysphagia, unspecified: Secondary | ICD-10-CM | POA: Diagnosis not present

## 2014-07-12 DIAGNOSIS — R531 Weakness: Secondary | ICD-10-CM | POA: Diagnosis not present

## 2014-07-12 DIAGNOSIS — R198 Other specified symptoms and signs involving the digestive system and abdomen: Secondary | ICD-10-CM | POA: Diagnosis not present

## 2014-07-14 ENCOUNTER — Ambulatory Visit (INDEPENDENT_AMBULATORY_CARE_PROVIDER_SITE_OTHER): Payer: Medicare Other | Admitting: Internal Medicine

## 2014-07-14 ENCOUNTER — Encounter: Payer: Self-pay | Admitting: Internal Medicine

## 2014-07-14 VITALS — BP 98/62 | HR 87 | Temp 98.4°F | Wt 244.0 lb

## 2014-07-14 DIAGNOSIS — R42 Dizziness and giddiness: Secondary | ICD-10-CM | POA: Diagnosis not present

## 2014-07-14 DIAGNOSIS — E785 Hyperlipidemia, unspecified: Secondary | ICD-10-CM | POA: Diagnosis not present

## 2014-07-14 DIAGNOSIS — I10 Essential (primary) hypertension: Secondary | ICD-10-CM

## 2014-07-14 DIAGNOSIS — E119 Type 2 diabetes mellitus without complications: Secondary | ICD-10-CM | POA: Diagnosis not present

## 2014-07-14 NOTE — Progress Notes (Signed)
Pre visit review using our clinic review tool, if applicable. No additional management support is needed unless otherwise documented below in the visit note. 

## 2014-07-14 NOTE — Progress Notes (Signed)
Subjective:    Patient ID: [name], male    DOB: 26-Apr-1951, 63 y.o.   MRN: [id number]  HPI  Here to f/u, with lack of energy and dizziness, overall about the same, cant say any worse, but still concerning to him, and vision gets blurry on very few occasion with dizziness. Takes amlodipine 5 mg most days (not everyday) wihtout checking BP, and was only supposed to take 5 mg for SBP > 130 according to instructions.  Pt denies chest pain, increased sob or doe, wheezing, orthopnea, PND, increased LE swelling, palpitations, dizziness or syncope except for the above.  .Pt denies new neurological symptoms such as new headache, or facial or extremity weakness or numbness Past Medical History  Diagnosis Date  . Hyperthyroidism   . GASTROENTERITIS, VIRAL 10/14/2009  . ONYCHOMYCOSIS, TOENAILS 12/26/2007  . GOITER, MULTINODULAR 12/26/2007  . HYPERTHYROIDISM 02/02/2010  . DIABETES MELLITUS, TYPE II 02/01/2010  . DYSLIPIDEMIA 06/18/2007  . GOUT 06/18/2007  . Hypocalcemia 06/07/2010  . DEMENTIA 09/02/2009  . DEPRESSION 10/14/2009  . PERIPHERAL NEUROPATHY 06/18/2007  . HYPERTENSION 06/18/2007  . CONGESTIVE HEART FAILURE 06/18/2007  . DIASTOLIC HEART FAILURE, CHRONIC 02/06/2009  . Unspecified hypotension 01/30/2010  . PULMONARY NODULE, RIGHT LOWER LOBE 06/08/2009  . GERD 06/18/2007  . ESRD 08/04/2010  . BENIGN PROSTATIC HYPERTROPHY 10/14/2009  . GYNECOMASTIA 07/17/2010  . NECK PAIN 07/31/2010  . FOOT PAIN 08/12/2008  . DIZZINESS 07/17/2010  . Other malaise and fatigue 11/24/2009  . GAIT DISTURBANCE 03/03/2010  . DYSPNEA 10/29/2008  . CHEST PAIN 03/29/2010  . TRANSAMINASES, SERUM, ELEVATED 02/01/2010  . COLONIC POLYPS, HX OF 10/14/2009  . Anemia 06/16/2011  . Ischemic cardiomyopathy 06/16/2011  . Hyperparathyroidism, secondary 06/16/2011  . OSA on CPAP 10/16/2011  . Hyperlipidemia 10/16/2011  . CAD, NATIVE VESSEL 02/06/2009    saw Dr. Missy Sabins last jan 2013  . Sleep apnea     cpap machine and o2  . Allergic rhinitis,  cause unspecified 02/24/2014   Past Surgical History  Procedure Laterality Date  . Stent 06/11/08    . Neck sugury  2/09  . Back surgury  1998  . Givens capsule study  09/29/2011    Procedure: GIVENS CAPSULE STUDY;  Surgeon: Missy Sabins, MD;  Location: [place];  Service: Endoscopy;  Laterality: Left;  . Esophagogastroduodenoscopy  09/28/2011    Procedure: ESOPHAGOGASTRODUODENOSCOPY (EGD);  Surgeon: Missy Sabins, MD;  Location: [facility] ENDOSCOPY;  Service: Endoscopy;  Laterality: N/A;  . Givens capsule study  09/30/2011    Procedure: GIVENS CAPSULE STUDY;  Surgeon: Jeryl Columbia, MD;  Location: [facility] ENDOSCOPY;  Service: Endoscopy;  Laterality: N/A;  . Cholecystectomy    . Cardiac catheterization    . Tonsillectomy    . Av fistula placement  11/07/2011    Procedure: INSERTION OF ARTERIOVENOUS (AV) GORE-TEX GRAFT ARM;  Surgeon: Tinnie Gens, MD;  Location: [place];  Service: Vascular;  Laterality: Left;  . Dialysis fistula creation  2009    right  . Insertion of dialysis catheter  2014    right side  . Insertion of dialysis catheter Left 02/11/2013    Procedure: INSERTION OF DIALYSIS CATHETER;  Surgeon: Conrad Timberlake, MD;  Location: [place];  Service: Vascular;  Laterality: Left;  Ultrasound guided  . Removal of a dialysis catheter Right 02/11/2013    Procedure: REMOVAL OF A DIALYSIS CATHETER;  Surgeon: Conrad Parsons, MD;  Location: [place];  Service: Vascular;  Laterality: Right;  . Bascilic vein transposition  Right 02/27/2013    Procedure: BASCILIC VEIN TRANSPOSITION;  Surgeon: Mal Misty, MD;  Location: [place];  Service: Vascular;  Laterality: Right;  Right Basilic Vein Transposition     reports that he has quit smoking. His smoking use included Cigarettes. He has a 25 pack-year smoking history. He has never used smokeless tobacco. He reports that he drinks alcohol. He reports that he does not use illicit drugs. family history includes Heart disease in his father and sister. Allergies  Allergen  Reactions  . Cephalexin Other (See Comments)    Tongue swelling  . Statins     Weak muscles   Review of Systems  Constitutional: Negative for unusual diaphoresis or other sweats  HENT: Negative for ringing in ear Eyes: Negative for double vision or worsening visual disturbance.  Respiratory: Negative for choking and stridor.   Gastrointestinal: Negative for vomiting or other signifcant bowel change Genitourinary: Negative for hematuria or decreased urine volume.  Musculoskeletal: Negative for other MSK pain or swelling Skin: Negative for color change and worsening wound.  Neurological: Negative for tremors and numbness other than noted  Psychiatric/Behavioral: Negative for decreased concentration or agitation other than above       Objective:   Physical Exam BP 98/62  Pulse 87  Temp(Src) 98.4 F (36.9 C) (Oral)  Wt 244 lb (110.678 kg)  SpO2 93% VS noted, including orthostatics noted Constitutional: Pt appears well-developed, well-nourished.  HENT: Head: NCAT.  Right Ear: External ear normal.  Left Ear: External ear normal.  Eyes: . Pupils are equal, round, and reactive to light. Conjunctivae and EOM are normal Neck: Normal range of motion. Neck supple.  Cardiovascular: Normal rate and regular rhythm.   Pulmonary/Chest: Effort normal and breath sounds normal.  Abd:  Soft, NT, ND, + BS Neurological: Pt is alert. Not confused , motor grossly intact Skin: Skin is warm. No rash Psychiatric: Pt behavior is normal. No agitation.     Assessment & Plan:

## 2014-07-14 NOTE — Assessment & Plan Note (Addendum)
ECG reviewed as per emr, suspect related to volume, to be handles with dialysis as he does

## 2014-07-14 NOTE — Patient Instructions (Addendum)
OK to stop the amlodipine  Please continue all other medications as before, and refills have been done if requested.  Please have the pharmacy call with any other refills you may need.  Please continue your efforts at being more active, low cholesterol diet, and weight control.  Please keep your appointments with your specialists as you may have planned  Please go to the LAB in the Basement (turn left off the elevator) for the tests to be done at your convenience  You will be contacted by phone if any changes need to be made immediately.  Otherwise, you will receive a letter about your results with an explanation, but please check with MyChart first.  Please remember to sign up for MyChart if you have not done so, as this will be important to you in the future with finding out test results, communicating by private email, and scheduling acute appointments online when needed.

## 2014-07-15 ENCOUNTER — Other Ambulatory Visit (HOSPITAL_COMMUNITY): Payer: Self-pay | Admitting: Gastroenterology

## 2014-07-15 DIAGNOSIS — R1314 Dysphagia, pharyngoesophageal phase: Secondary | ICD-10-CM

## 2014-07-16 ENCOUNTER — Other Ambulatory Visit (INDEPENDENT_AMBULATORY_CARE_PROVIDER_SITE_OTHER): Payer: Medicare Other

## 2014-07-16 DIAGNOSIS — E785 Hyperlipidemia, unspecified: Secondary | ICD-10-CM | POA: Diagnosis not present

## 2014-07-16 DIAGNOSIS — E119 Type 2 diabetes mellitus without complications: Secondary | ICD-10-CM

## 2014-07-16 LAB — LIPID PANEL
Cholesterol: 263 mg/dL — ABNORMAL HIGH (ref 0–200)
HDL: 27.9 mg/dL — ABNORMAL LOW (ref >39.00)
NonHDL: 235.1
Total CHOL/HDL Ratio: 9
Triglycerides: 409 mg/dL — ABNORMAL HIGH (ref 0.0–149.0)
VLDL: 81.8 mg/dL — AB (ref 0.0–40.0)

## 2014-07-16 LAB — HEMOGLOBIN A1C: HEMOGLOBIN A1C: 6.8 % — AB (ref 4.6–6.5)

## 2014-07-16 LAB — HEPATIC FUNCTION PANEL
ALT: 21 U/L (ref 0–53)
AST: 22 U/L (ref 0–37)
Albumin: 3.3 g/dL — ABNORMAL LOW (ref 3.5–5.2)
Alkaline Phosphatase: 98 U/L (ref 39–117)
Bilirubin, Direct: 0.1 mg/dL (ref 0.0–0.3)
TOTAL PROTEIN: 7.9 g/dL (ref 6.0–8.3)
Total Bilirubin: 1 mg/dL (ref 0.2–1.2)

## 2014-07-16 LAB — LDL CHOLESTEROL, DIRECT: LDL DIRECT: 136 mg/dL

## 2014-07-17 NOTE — Assessment & Plan Note (Signed)
stable overall by history and exam, recent data reviewed with pt, and pt to continue medical treatment as before,  to f/u any worsening symptoms or concerns Lab Results  Component Value Date   LDLCALC 185* 01/13/2014   Cont's statin intol, to cont lower chol diet

## 2014-07-17 NOTE — Assessment & Plan Note (Signed)
With some orthostasis today, to d/c the amloidipine as not taking correctly and likely making symptoms worse,  to f/u any worsening symptoms or concerns BP Readings from Last 3 Encounters:  07/14/14 98/62  04/21/14 118/74  03/10/14 164/78

## 2014-07-17 NOTE — Assessment & Plan Note (Signed)
stable overall by history and exam, recent data reviewed with pt, and pt to continue medical treatment as before,  to f/u any worsening symptoms or concerns Lab Results  Component Value Date   HGBA1C 6.8* 07/16/2014

## 2014-07-20 ENCOUNTER — Encounter: Payer: Self-pay | Admitting: Internal Medicine

## 2014-07-20 ENCOUNTER — Other Ambulatory Visit (HOSPITAL_COMMUNITY): Payer: Medicare Other

## 2014-07-20 ENCOUNTER — Ambulatory Visit (HOSPITAL_COMMUNITY): Payer: Medicare Other

## 2014-07-28 ENCOUNTER — Ambulatory Visit (HOSPITAL_COMMUNITY)
Admission: RE | Admit: 2014-07-28 | Discharge: 2014-07-28 | Disposition: A | Discharge status: Home / Self Care | Payer: Medicare Other | Source: Ambulatory Visit | Attending: Gastroenterology | Admitting: Gastroenterology

## 2014-07-28 DIAGNOSIS — K224 Dyskinesia of esophagus: Secondary | ICD-10-CM | POA: Diagnosis present

## 2014-07-28 DIAGNOSIS — R1314 Dysphagia, pharyngoesophageal phase: Secondary | ICD-10-CM | POA: Insufficient documentation

## 2014-07-28 DIAGNOSIS — R1310 Dysphagia, unspecified: Secondary | ICD-10-CM | POA: Diagnosis not present

## 2014-07-28 DIAGNOSIS — K219 Gastro-esophageal reflux disease without esophagitis: Secondary | ICD-10-CM | POA: Insufficient documentation

## 2014-07-28 DIAGNOSIS — R05 Cough: Secondary | ICD-10-CM | POA: Diagnosis not present

## 2014-07-28 NOTE — Procedures (Signed)
Objective Swallowing Evaluation: Modified Barium Swallowing Study  Patient Details  Name: [name] MRN: [id number] Date of Birth: 1950-11-08  Today's Date: 07/28/2014 Time: [id number] SLP Time Calculation (min): 32 min  Past Medical History:  Past Medical History  Diagnosis Date  . Hyperthyroidism   . GASTROENTERITIS, VIRAL 10/14/2009  . ONYCHOMYCOSIS, TOENAILS 12/26/2007  . GOITER, MULTINODULAR 12/26/2007  . HYPERTHYROIDISM 02/02/2010  . DIABETES MELLITUS, TYPE II 02/01/2010  . DYSLIPIDEMIA 06/18/2007  . GOUT 06/18/2007  . Hypocalcemia 06/07/2010  . DEMENTIA 09/02/2009  . DEPRESSION 10/14/2009  . PERIPHERAL NEUROPATHY 06/18/2007  . HYPERTENSION 06/18/2007  . CONGESTIVE HEART FAILURE 06/18/2007  . DIASTOLIC HEART FAILURE, CHRONIC 02/06/2009  . Unspecified hypotension 01/30/2010  . PULMONARY NODULE, RIGHT LOWER LOBE 06/08/2009  . GERD 06/18/2007  . ESRD 08/04/2010  . BENIGN PROSTATIC HYPERTROPHY 10/14/2009  . GYNECOMASTIA 07/17/2010  . NECK PAIN 07/31/2010  . FOOT PAIN 08/12/2008  . DIZZINESS 07/17/2010  . Other malaise and fatigue 11/24/2009  . GAIT DISTURBANCE 03/03/2010  . DYSPNEA 10/29/2008  . CHEST PAIN 03/29/2010  . TRANSAMINASES, SERUM, ELEVATED 02/01/2010  . COLONIC POLYPS, HX OF 10/14/2009  . Anemia 06/16/2011  . Ischemic cardiomyopathy 06/16/2011  . Hyperparathyroidism, secondary 06/16/2011  . OSA on CPAP 10/16/2011  . Hyperlipidemia 10/16/2011  . CAD, NATIVE VESSEL 02/06/2009    saw Dr. Missy Sabins last jan 2013  . Sleep apnea     cpap machine and o2  . Allergic rhinitis, cause unspecified 02/24/2014   Past Surgical History:  Past Surgical History  Procedure Laterality Date  . Stent 06/11/08    . Neck sugury  2/09  . Back surgury  1998  . Givens capsule study  09/29/2011    Procedure: GIVENS CAPSULE STUDY;  Surgeon: Missy Sabins, MD;  Location: [place];  Service: Endoscopy;  Laterality: Left;  . Esophagogastroduodenoscopy  09/28/2011    Procedure: ESOPHAGOGASTRODUODENOSCOPY (EGD);   Surgeon: Missy Sabins, MD;  Location: [facility] ENDOSCOPY;  Service: Endoscopy;  Laterality: N/A;  . Givens capsule study  09/30/2011    Procedure: GIVENS CAPSULE STUDY;  Surgeon: Jeryl Columbia, MD;  Location: [facility] ENDOSCOPY;  Service: Endoscopy;  Laterality: N/A;  . Cholecystectomy    . Cardiac catheterization    . Tonsillectomy    . Av fistula placement  11/07/2011    Procedure: INSERTION OF ARTERIOVENOUS (AV) GORE-TEX GRAFT ARM;  Surgeon: Tinnie Gens, MD;  Location: [place];  Service: Vascular;  Laterality: Left;  . Dialysis fistula creation  2009    right  . Insertion of dialysis catheter  2014    right side  . Insertion of dialysis catheter Left 02/11/2013    Procedure: INSERTION OF DIALYSIS CATHETER;  Surgeon: Conrad Alton, MD;  Location: [place];  Service: Vascular;  Laterality: Left;  Ultrasound guided  . Removal of a dialysis catheter Right 02/11/2013    Procedure: REMOVAL OF A DIALYSIS CATHETER;  Surgeon: Conrad Roper, MD;  Location: [place];  Service: Vascular;  Laterality: Right;  . Bascilic vein transposition Right 02/27/2013    Procedure: [place];  Surgeon: Mal Misty, MD;  [facility];  Service: Vascular;  Laterality: Right;  Right Basilic Vein Transposition    HPI:  63 yr old seen for outpatient MBS accompanied by his wife.  Pt. has history of esophageal dilation (greater 2 yrs ago), GERD, described what appears to be esophageal dysmotility, ACDF 2009, HTN, mild dementia, OSA, gait disturbance.  Pt. complains of globus sensation in chest  during/after meals, intermittent coughing after solidslilquids, MBS 2009 after ACDF (results unknown, pt. denies thick liquids).      Assessment / Plan / Recommendation Clinical Impression  Dysphagia Diagnosis: Suspected primary esophageal dysphagia Clinical impression: Pt. exhibited suspected primary esophageal dysphagia.  Pharyngeal phase overall, within functional limits with one instance of delayed swallow to pyriform sinuses  with straw sips thin liquid.  Subsequent swallow initiation was timely without laryngeal penetration or aspiration or residue.  Esophagus was scanned, with radiologist present who reported esophageal dysmotility and possible distal stricture. Barium esophagram may be helpful for further evaluation.  SLP educated pt. and wife on reflux precautions (stay upright one hour after meals, alternate solids and liquids, acidic/fatty foods in moderation, elevate head of bed if needed).  Recommend pt. continue regular texture and thin liquids with reflux precautions and further esophageal work up.    Treatment Recommendation  No treatment recommended at this time    Diet Recommendation Regular;Thin liquid   Liquid Administration via: Cup;Straw Medication Administration: Whole meds with liquid Supervision: Patient able to self feed Compensations: Slow rate;Small sips/bites;Follow solids with liquid Postural Changes and/or Swallow Maneuvers: Seated upright 90 degrees;Upright 30-60 min after meal    Other  Recommendations Oral Care Recommendations: Oral care BID   Follow Up Recommendations  None    Frequency and Duration        Pertinent Vitals/Pain No pain            Reason for Referral Objectively evaluate swallowing function   Oral Phase Oral Preparation/Oral Phase Oral Phase: WFL   Pharyngeal Phase Pharyngeal Phase Pharyngeal Phase: Impaired Pharyngeal - Thin Pharyngeal - Thin Straw: Delayed swallow initiation;Premature spillage to pyriform sinuses Pharyngeal - Solids Pharyngeal - Pill: Within functional limits  Cervical Esophageal Phase    GO    Cervical Esophageal Phase Cervical Esophageal Phase: [facility]    Functional Assessment Tool Used: skilled clinical judgement Functional Limitations: Swallowing Swallow Current Status [id number]): At least 1 percent but less than 20 percent impaired, limited or restricted Swallow Goal Status [phone]): At least 1 percent but less than 20  percent impaired, limited or restricted Swallow Discharge Status [phone]): At least 1 percent but less than 20 percent impaired, limited or restricted    Houston Siren 07/28/2014, 1:15 PM  Orbie Pyo Colvin Caroli.[employer] [phone]

## 2014-07-29 DIAGNOSIS — E119 Type 2 diabetes mellitus without complications: Secondary | ICD-10-CM | POA: Diagnosis not present

## 2014-07-31 DIAGNOSIS — Z992 Dependence on renal dialysis: Secondary | ICD-10-CM | POA: Diagnosis not present

## 2014-07-31 DIAGNOSIS — N186 End stage renal disease: Secondary | ICD-10-CM | POA: Diagnosis not present

## 2014-08-03 DIAGNOSIS — D631 Anemia in chronic kidney disease: Secondary | ICD-10-CM | POA: Diagnosis not present

## 2014-08-03 DIAGNOSIS — D509 Iron deficiency anemia, unspecified: Secondary | ICD-10-CM | POA: Diagnosis not present

## 2014-08-03 DIAGNOSIS — N2581 Secondary hyperparathyroidism of renal origin: Secondary | ICD-10-CM | POA: Diagnosis not present

## 2014-08-03 DIAGNOSIS — E119 Type 2 diabetes mellitus without complications: Secondary | ICD-10-CM | POA: Diagnosis not present

## 2014-08-03 DIAGNOSIS — N186 End stage renal disease: Secondary | ICD-10-CM | POA: Diagnosis not present

## 2014-08-12 ENCOUNTER — Other Ambulatory Visit: Payer: Self-pay | Admitting: Gastroenterology

## 2014-08-12 DIAGNOSIS — R4702 Dysphasia: Secondary | ICD-10-CM

## 2014-08-16 DIAGNOSIS — I10 Essential (primary) hypertension: Secondary | ICD-10-CM | POA: Insufficient documentation

## 2014-08-16 DIAGNOSIS — N186 End stage renal disease: Secondary | ICD-10-CM | POA: Insufficient documentation

## 2014-08-16 DIAGNOSIS — Z992 Dependence on renal dialysis: Secondary | ICD-10-CM | POA: Insufficient documentation

## 2014-08-18 ENCOUNTER — Other Ambulatory Visit: Payer: Medicare Other

## 2014-08-20 ENCOUNTER — Ambulatory Visit
Admission: RE | Admit: 2014-08-20 | Discharge: 2014-08-20 | Disposition: A | Discharge status: Home / Self Care | Payer: Medicare Other | Source: Ambulatory Visit | Attending: Gastroenterology | Admitting: Gastroenterology

## 2014-08-20 DIAGNOSIS — K449 Diaphragmatic hernia without obstruction or gangrene: Secondary | ICD-10-CM | POA: Diagnosis not present

## 2014-08-20 DIAGNOSIS — K228 Other specified diseases of esophagus: Secondary | ICD-10-CM | POA: Diagnosis not present

## 2014-08-20 DIAGNOSIS — R4702 Dysphasia: Secondary | ICD-10-CM

## 2014-08-23 ENCOUNTER — Ambulatory Visit: Payer: Medicare Other | Admitting: Endocrinology

## 2014-08-30 ENCOUNTER — Encounter: Payer: Self-pay | Admitting: Endocrinology

## 2014-08-30 ENCOUNTER — Ambulatory Visit (INDEPENDENT_AMBULATORY_CARE_PROVIDER_SITE_OTHER): Payer: Medicare Other | Admitting: Endocrinology

## 2014-08-30 VITALS — BP 139/71 | HR 87 | Temp 98.6°F | Ht 73.0 in | Wt 244.0 lb

## 2014-08-30 DIAGNOSIS — R200 Anesthesia of skin: Secondary | ICD-10-CM

## 2014-08-30 DIAGNOSIS — N189 Chronic kidney disease, unspecified: Secondary | ICD-10-CM

## 2014-08-30 DIAGNOSIS — E0520 Thyrotoxicosis with toxic multinodular goiter without thyrotoxic crisis or storm: Secondary | ICD-10-CM

## 2014-08-30 DIAGNOSIS — E291 Testicular hypofunction: Secondary | ICD-10-CM | POA: Diagnosis not present

## 2014-08-30 DIAGNOSIS — R972 Elevated prostate specific antigen [PSA]: Secondary | ICD-10-CM

## 2014-08-30 DIAGNOSIS — E1122 Type 2 diabetes mellitus with diabetic chronic kidney disease: Secondary | ICD-10-CM | POA: Diagnosis not present

## 2014-08-30 DIAGNOSIS — N186 End stage renal disease: Secondary | ICD-10-CM | POA: Diagnosis not present

## 2014-08-30 LAB — VITAMIN B12: Vitamin B-12: 499 pg/mL (ref 211–911)

## 2014-08-30 LAB — PSA: PSA: 3.67 ng/mL (ref 0.10–4.00)

## 2014-08-30 LAB — TSH: TSH: 0.34 u[IU]/mL — ABNORMAL LOW (ref 0.35–4.50)

## 2014-08-30 LAB — TESTOSTERONE: Testosterone: 173.97 ng/dL — ABNORMAL LOW (ref 300.00–890.00)

## 2014-08-30 LAB — HEMOGLOBIN A1C: Hgb A1c MFr Bld: 6.8 % — ABNORMAL HIGH (ref 4.6–6.5)

## 2014-08-30 NOTE — Patient Instructions (Signed)
blood tests are being requested for you today.  We'll contact you with results. Please come back for a follow-up appointment in 4 months.

## 2014-08-30 NOTE — Progress Notes (Signed)
Subjective:    Patient ID: [name], male    DOB: August 02, 1951, 63 y.o.   MRN: [id number]  HPI The state of at least three ongoing medical problems is addressed today: Pt returns for f/u of diabetes mellitus: DM type: 2 Dx'ed: [id number] Complications: polyneuropathy, renal failure, and CAD Therapy: welchol DKA: never Severe hypoglycemia: never Pancreatitis: never Other: med needs has decreased with worsening of renal function. Interval history:  no cbg record, but states cbg's are well-controlled.   Hyperthyroidism (due to a multinodular goiter; dx'ed 2009; bx then was benign; nuc med scan showed very low uptake, so tapazole was chosen as rx).  [name] still has fatigue.  Hypogonadism was noted at dialysis in 2013.  took clomid 2013-2014.  Past Medical History  Diagnosis Date  . Hyperthyroidism   . GASTROENTERITIS, VIRAL 10/14/2009  . ONYCHOMYCOSIS, TOENAILS 12/26/2007  . GOITER, MULTINODULAR 12/26/2007  . HYPERTHYROIDISM 02/02/2010  . DIABETES MELLITUS, TYPE II 02/01/2010  . DYSLIPIDEMIA 06/18/2007  . GOUT 06/18/2007  . Hypocalcemia 06/07/2010  . DEMENTIA 09/02/2009  . DEPRESSION 10/14/2009  . PERIPHERAL NEUROPATHY 06/18/2007  . HYPERTENSION 06/18/2007  . CONGESTIVE HEART FAILURE 06/18/2007  . DIASTOLIC HEART FAILURE, CHRONIC 02/06/2009  . Unspecified hypotension 01/30/2010  . PULMONARY NODULE, RIGHT LOWER LOBE 06/08/2009  . GERD 06/18/2007  . ESRD 08/04/2010  . BENIGN PROSTATIC HYPERTROPHY 10/14/2009  . GYNECOMASTIA 07/17/2010  . NECK PAIN 07/31/2010  . FOOT PAIN 08/12/2008  . DIZZINESS 07/17/2010  . Other malaise and fatigue 11/24/2009  . GAIT DISTURBANCE 03/03/2010  . DYSPNEA 10/29/2008  . CHEST PAIN 03/29/2010  . TRANSAMINASES, SERUM, ELEVATED 02/01/2010  . COLONIC POLYPS, HX OF 10/14/2009  . Anemia 06/16/2011  . Ischemic cardiomyopathy 06/16/2011  . Hyperparathyroidism, secondary 06/16/2011  . OSA on CPAP 10/16/2011  . Hyperlipidemia 10/16/2011  . CAD, NATIVE VESSEL 02/06/2009    saw Dr. Missy Sabins  last jan 2013  . Sleep apnea     cpap machine and o2  . Allergic rhinitis, cause unspecified 02/24/2014    Past Surgical History  Procedure Laterality Date  . Stent 06/11/08    . Neck sugury  2/09  . Back surgury  1998  . Givens capsule study  09/29/2011    Procedure: GIVENS CAPSULE STUDY;  Surgeon: Missy Sabins, MD;  Location: [place];  Service: Endoscopy;  Laterality: Left;  . Esophagogastroduodenoscopy  09/28/2011    Procedure: ESOPHAGOGASTRODUODENOSCOPY (EGD);  Surgeon: Missy Sabins, MD;  Location: [facility] ENDOSCOPY;  Service: Endoscopy;  Laterality: N/A;  . Givens capsule study  09/30/2011    Procedure: GIVENS CAPSULE STUDY;  Surgeon: Jeryl Columbia, MD;  Location: [facility] ENDOSCOPY;  Service: Endoscopy;  Laterality: N/A;  . Cholecystectomy    . Cardiac catheterization    . Tonsillectomy    . Av fistula placement  11/07/2011    Procedure: INSERTION OF ARTERIOVENOUS (AV) GORE-TEX GRAFT ARM;  Surgeon: Tinnie Gens, MD;  Location: [place];  Service: Vascular;  Laterality: Left;  . Dialysis fistula creation  2009    right  . Insertion of dialysis catheter  2014    right side  . Insertion of dialysis catheter Left 02/11/2013    Procedure: INSERTION OF DIALYSIS CATHETER;  Surgeon: Conrad Diablo, MD;  Location: [place];  Service: Vascular;  Laterality: Left;  Ultrasound guided  . Removal of a dialysis catheter Right 02/11/2013    Procedure: REMOVAL OF A DIALYSIS CATHETER;  Surgeon: Conrad Wal[place] Grove, MD;  Location: [place];  Service: Vascular;  Laterality: Right;  . Bascilic vein transposition Right 02/27/2013    Procedure: [place];  Surgeon: Mal Misty, MD;  Location: [place];  Service: Vascular;  Laterality: Right;  Right Basilic Vein Transposition     History   Social History  . Marital Status: Married    Spouse Name: N/A    Number of Children: N/A  . Years of Education: N/A   Occupational History  . disabled   . formerly Lawyer for [employer].    Social History  Main Topics  . Smoking status: Former Smoker -- 1.00 packs/day for 25 years    Types: Cigarettes  . Smokeless tobacco: Never Used     Comment: Quit smoking 2007 Smoked x 25 years 1/2 ppd.  . Alcohol Use: Yes     Comment: occassional   . Drug Use: No  . Sexual Activity: Not on file   Other Topics Concern  . Not on file   Social History Narrative    Current Outpatient Prescriptions on File Prior to Visit  Medication Sig Dispense Refill  . allopurinol (ZYLOPRIM) 100 MG tablet Take 1 tablet (100 mg total) by mouth daily. 30 tablet 11  . amitriptyline (ELAVIL) 50 MG tablet Take 50 mg by mouth daily as needed (rest).     [name] aspirin 81 MG EC tablet Take 81 mg by mouth daily.     . calcium-vitamin D (OSCAL WITH D) 500-200 MG-UNIT per tablet Take 3 tablets by mouth 3 (three) times daily with meals.     . carvedilol (COREG) 25 MG tablet Take 0.5 tablets (12.5 mg total) by mouth 2 (two) times daily with a meal. Only on non-HD days    . cinacalcet (SENSIPAR) 60 MG tablet Take 60 mg by mouth 2 (two) times daily.     . citalopram (CELEXA) 20 MG tablet Take 1 tablet (20 mg total) by mouth daily. 90 tablet 3  . colesevelam (WELCHOL) 625 MG tablet Take 3 tablets (1,875 mg total) by mouth 2 (two) times daily with a meal. 180 tablet 11  . gabapentin (NEURONTIN) 300 MG capsule Take 1 capsule (300 mg total) by mouth at bedtime as needed. For pain 90 capsule 1  . glucose blood (FREESTYLE TEST STRIPS) test strip 1 each by Other route daily. And lancets 1/day 250.40 100 each 12  . lanthanum (FOSRENOL) 1000 MG chewable tablet Chew 1,000 mg by mouth 2 times daily at 12 noon and 4 pm.    . methimazole (TAPAZOLE) 5 MG tablet Take 1 tablet (5 mg total) by mouth daily. 30 tablet 5  . multivitamin (RENA-VIT) TABS tablet Take 1 tablet by mouth daily.      . ondansetron (ZOFRAN) 4 MG tablet Take 1 tablet (4 mg total) by mouth every 8 (eight) hours as needed for nausea or vomiting. 30 tablet 0  . ranitidine (ZANTAC)  300 MG tablet Take 300 mg by mouth at bedtime.     No current facility-administered medications on file prior to visit.    Allergies  Allergen Reactions  . Cephalexin Other (See Comments)    Tongue swelling  . Statins     Weak muscles    Family History  Problem Relation Age of Onset  . Heart disease Father   . Heart disease Sister     BP 139/71 mmHg  Pulse 87  Temp(Src) 98.6 F (37 C) (Oral)  Ht 6\' 1"  (1.854 m)  Wt 244 lb (110.678 kg)  BMI 32.20 kg/m2  SpO2  98%    Review of Systems [name] urinates very little.  [name] denies hypoglycemia.      Objective:   Physical Exam VITAL SIGNS:  See vs page GENERAL: no distress Pulses: dorsalis pedis intact bilat.  Feet: no deformity. feet are of normal color and temp. no edema. There is bilateral onychomycosis.  Skin: no ulcer on the feet.  Neuro: sensation is intact to touch on the feet, but decreased from normal.   Lab Results  Component Value Date   TSH 0.34* 08/30/2014   T4TOTAL 5.6 07/19/2008   Lab Results  Component Value Date   HGBA1C 6.8* 08/30/2014   Lab Results  Component Value Date   PSA 3.67 08/30/2014   PSA 4.03* 01/13/2014   PSA 3.25 08/17/2013   Lab Results  Component Value Date   TESTOSTERONE 173.97* 08/30/2014       Assessment & Plan:  Elevated PSA: improved Hypogonadism: despite the improvement in PSA, it is not safe to take clomid. DM: well-controlled on welchol only Hyperthyroidism: improved on tapazole.    Patient is advised the following: Patient Instructions  blood tests are being requested for you today.  We'll contact you with results. Please come back for a follow-up appointment in 4 months.

## 2014-08-31 DIAGNOSIS — D509 Iron deficiency anemia, unspecified: Secondary | ICD-10-CM | POA: Diagnosis not present

## 2014-08-31 DIAGNOSIS — N186 End stage renal disease: Secondary | ICD-10-CM | POA: Diagnosis not present

## 2014-08-31 DIAGNOSIS — N2581 Secondary hyperparathyroidism of renal origin: Secondary | ICD-10-CM | POA: Diagnosis not present

## 2014-08-31 DIAGNOSIS — E119 Type 2 diabetes mellitus without complications: Secondary | ICD-10-CM | POA: Diagnosis not present

## 2014-09-03 DIAGNOSIS — R911 Solitary pulmonary nodule: Secondary | ICD-10-CM | POA: Diagnosis not present

## 2014-09-03 DIAGNOSIS — Z01811 Encounter for preprocedural respiratory examination: Secondary | ICD-10-CM | POA: Diagnosis not present

## 2014-09-06 DIAGNOSIS — R1310 Dysphagia, unspecified: Secondary | ICD-10-CM | POA: Diagnosis not present

## 2014-09-06 DIAGNOSIS — R142 Eructation: Secondary | ICD-10-CM | POA: Diagnosis not present

## 2014-09-09 ENCOUNTER — Encounter (HOSPITAL_COMMUNITY): Payer: Self-pay | Admitting: Vascular Surgery

## 2014-09-14 DIAGNOSIS — R911 Solitary pulmonary nodule: Secondary | ICD-10-CM | POA: Insufficient documentation

## 2014-09-30 DIAGNOSIS — N186 End stage renal disease: Secondary | ICD-10-CM | POA: Diagnosis not present

## 2014-09-30 DIAGNOSIS — Z992 Dependence on renal dialysis: Secondary | ICD-10-CM | POA: Diagnosis not present

## 2014-10-03 DIAGNOSIS — N2581 Secondary hyperparathyroidism of renal origin: Secondary | ICD-10-CM | POA: Diagnosis not present

## 2014-10-03 DIAGNOSIS — E119 Type 2 diabetes mellitus without complications: Secondary | ICD-10-CM | POA: Diagnosis not present

## 2014-10-03 DIAGNOSIS — N186 End stage renal disease: Secondary | ICD-10-CM | POA: Diagnosis not present

## 2014-10-04 DIAGNOSIS — R911 Solitary pulmonary nodule: Secondary | ICD-10-CM | POA: Diagnosis not present

## 2014-10-04 DIAGNOSIS — J811 Chronic pulmonary edema: Secondary | ICD-10-CM | POA: Diagnosis not present

## 2014-10-05 DIAGNOSIS — E119 Type 2 diabetes mellitus without complications: Secondary | ICD-10-CM | POA: Diagnosis not present

## 2014-10-05 DIAGNOSIS — N2581 Secondary hyperparathyroidism of renal origin: Secondary | ICD-10-CM | POA: Diagnosis not present

## 2014-10-05 DIAGNOSIS — N186 End stage renal disease: Secondary | ICD-10-CM | POA: Diagnosis not present

## 2014-10-07 DIAGNOSIS — E119 Type 2 diabetes mellitus without complications: Secondary | ICD-10-CM | POA: Diagnosis not present

## 2014-10-07 DIAGNOSIS — N2581 Secondary hyperparathyroidism of renal origin: Secondary | ICD-10-CM | POA: Diagnosis not present

## 2014-10-07 DIAGNOSIS — N186 End stage renal disease: Secondary | ICD-10-CM | POA: Diagnosis not present

## 2014-10-08 DIAGNOSIS — G5601 Carpal tunnel syndrome, right upper limb: Secondary | ICD-10-CM | POA: Diagnosis not present

## 2014-10-08 DIAGNOSIS — G5602 Carpal tunnel syndrome, left upper limb: Secondary | ICD-10-CM | POA: Diagnosis not present

## 2014-10-09 DIAGNOSIS — E119 Type 2 diabetes mellitus without complications: Secondary | ICD-10-CM | POA: Diagnosis not present

## 2014-10-09 DIAGNOSIS — N2581 Secondary hyperparathyroidism of renal origin: Secondary | ICD-10-CM | POA: Diagnosis not present

## 2014-10-09 DIAGNOSIS — N186 End stage renal disease: Secondary | ICD-10-CM | POA: Diagnosis not present

## 2014-10-12 DIAGNOSIS — N2581 Secondary hyperparathyroidism of renal origin: Secondary | ICD-10-CM | POA: Diagnosis not present

## 2014-10-12 DIAGNOSIS — N186 End stage renal disease: Secondary | ICD-10-CM | POA: Diagnosis not present

## 2014-10-12 DIAGNOSIS — E119 Type 2 diabetes mellitus without complications: Secondary | ICD-10-CM | POA: Diagnosis not present

## 2014-10-14 ENCOUNTER — Encounter (HOSPITAL_COMMUNITY): Payer: Self-pay | Admitting: Gastroenterology

## 2014-10-14 DIAGNOSIS — E119 Type 2 diabetes mellitus without complications: Secondary | ICD-10-CM | POA: Diagnosis not present

## 2014-10-14 DIAGNOSIS — N2581 Secondary hyperparathyroidism of renal origin: Secondary | ICD-10-CM | POA: Diagnosis not present

## 2014-10-14 DIAGNOSIS — N186 End stage renal disease: Secondary | ICD-10-CM | POA: Diagnosis not present

## 2014-10-16 DIAGNOSIS — N186 End stage renal disease: Secondary | ICD-10-CM | POA: Diagnosis not present

## 2014-10-16 DIAGNOSIS — N2581 Secondary hyperparathyroidism of renal origin: Secondary | ICD-10-CM | POA: Diagnosis not present

## 2014-10-16 DIAGNOSIS — E119 Type 2 diabetes mellitus without complications: Secondary | ICD-10-CM | POA: Diagnosis not present

## 2014-10-18 DIAGNOSIS — G5602 Carpal tunnel syndrome, left upper limb: Secondary | ICD-10-CM | POA: Diagnosis not present

## 2014-10-18 DIAGNOSIS — G5601 Carpal tunnel syndrome, right upper limb: Secondary | ICD-10-CM | POA: Diagnosis not present

## 2014-10-19 DIAGNOSIS — N2581 Secondary hyperparathyroidism of renal origin: Secondary | ICD-10-CM | POA: Diagnosis not present

## 2014-10-19 DIAGNOSIS — N186 End stage renal disease: Secondary | ICD-10-CM | POA: Diagnosis not present

## 2014-10-19 DIAGNOSIS — E119 Type 2 diabetes mellitus without complications: Secondary | ICD-10-CM | POA: Diagnosis not present

## 2014-10-21 DIAGNOSIS — N2581 Secondary hyperparathyroidism of renal origin: Secondary | ICD-10-CM | POA: Diagnosis not present

## 2014-10-21 DIAGNOSIS — N186 End stage renal disease: Secondary | ICD-10-CM | POA: Diagnosis not present

## 2014-10-21 DIAGNOSIS — E119 Type 2 diabetes mellitus without complications: Secondary | ICD-10-CM | POA: Diagnosis not present

## 2014-10-23 DIAGNOSIS — N186 End stage renal disease: Secondary | ICD-10-CM | POA: Diagnosis not present

## 2014-10-23 DIAGNOSIS — E119 Type 2 diabetes mellitus without complications: Secondary | ICD-10-CM | POA: Diagnosis not present

## 2014-10-23 DIAGNOSIS — N2581 Secondary hyperparathyroidism of renal origin: Secondary | ICD-10-CM | POA: Diagnosis not present

## 2014-10-25 DIAGNOSIS — G5602 Carpal tunnel syndrome, left upper limb: Secondary | ICD-10-CM | POA: Diagnosis not present

## 2014-10-25 DIAGNOSIS — G5601 Carpal tunnel syndrome, right upper limb: Secondary | ICD-10-CM | POA: Diagnosis not present

## 2014-10-26 DIAGNOSIS — N186 End stage renal disease: Secondary | ICD-10-CM | POA: Diagnosis not present

## 2014-10-26 DIAGNOSIS — E119 Type 2 diabetes mellitus without complications: Secondary | ICD-10-CM | POA: Diagnosis not present

## 2014-10-26 DIAGNOSIS — N2581 Secondary hyperparathyroidism of renal origin: Secondary | ICD-10-CM | POA: Diagnosis not present

## 2014-10-28 DIAGNOSIS — N186 End stage renal disease: Secondary | ICD-10-CM | POA: Diagnosis not present

## 2014-10-28 DIAGNOSIS — E119 Type 2 diabetes mellitus without complications: Secondary | ICD-10-CM | POA: Diagnosis not present

## 2014-10-28 DIAGNOSIS — N2581 Secondary hyperparathyroidism of renal origin: Secondary | ICD-10-CM | POA: Diagnosis not present

## 2014-10-30 DIAGNOSIS — N2581 Secondary hyperparathyroidism of renal origin: Secondary | ICD-10-CM | POA: Diagnosis not present

## 2014-10-30 DIAGNOSIS — N186 End stage renal disease: Secondary | ICD-10-CM | POA: Diagnosis not present

## 2014-10-30 DIAGNOSIS — E119 Type 2 diabetes mellitus without complications: Secondary | ICD-10-CM | POA: Diagnosis not present

## 2014-10-31 DIAGNOSIS — Z992 Dependence on renal dialysis: Secondary | ICD-10-CM | POA: Diagnosis not present

## 2014-10-31 DIAGNOSIS — N186 End stage renal disease: Secondary | ICD-10-CM | POA: Diagnosis not present

## 2014-11-02 DIAGNOSIS — N186 End stage renal disease: Secondary | ICD-10-CM | POA: Diagnosis not present

## 2014-11-02 DIAGNOSIS — N2581 Secondary hyperparathyroidism of renal origin: Secondary | ICD-10-CM | POA: Diagnosis not present

## 2014-11-02 DIAGNOSIS — E119 Type 2 diabetes mellitus without complications: Secondary | ICD-10-CM | POA: Diagnosis not present

## 2014-11-04 DIAGNOSIS — N186 End stage renal disease: Secondary | ICD-10-CM | POA: Diagnosis not present

## 2014-11-04 DIAGNOSIS — E119 Type 2 diabetes mellitus without complications: Secondary | ICD-10-CM | POA: Diagnosis not present

## 2014-11-04 DIAGNOSIS — N2581 Secondary hyperparathyroidism of renal origin: Secondary | ICD-10-CM | POA: Diagnosis not present

## 2014-11-06 DIAGNOSIS — E119 Type 2 diabetes mellitus without complications: Secondary | ICD-10-CM | POA: Diagnosis not present

## 2014-11-06 DIAGNOSIS — N2581 Secondary hyperparathyroidism of renal origin: Secondary | ICD-10-CM | POA: Diagnosis not present

## 2014-11-06 DIAGNOSIS — N186 End stage renal disease: Secondary | ICD-10-CM | POA: Diagnosis not present

## 2014-11-09 DIAGNOSIS — E119 Type 2 diabetes mellitus without complications: Secondary | ICD-10-CM | POA: Diagnosis not present

## 2014-11-09 DIAGNOSIS — N186 End stage renal disease: Secondary | ICD-10-CM | POA: Diagnosis not present

## 2014-11-09 DIAGNOSIS — N2581 Secondary hyperparathyroidism of renal origin: Secondary | ICD-10-CM | POA: Diagnosis not present

## 2014-11-11 DIAGNOSIS — N186 End stage renal disease: Secondary | ICD-10-CM | POA: Diagnosis not present

## 2014-11-11 DIAGNOSIS — E119 Type 2 diabetes mellitus without complications: Secondary | ICD-10-CM | POA: Diagnosis not present

## 2014-11-11 DIAGNOSIS — N2581 Secondary hyperparathyroidism of renal origin: Secondary | ICD-10-CM | POA: Diagnosis not present

## 2014-11-13 DIAGNOSIS — N2581 Secondary hyperparathyroidism of renal origin: Secondary | ICD-10-CM | POA: Diagnosis not present

## 2014-11-13 DIAGNOSIS — E119 Type 2 diabetes mellitus without complications: Secondary | ICD-10-CM | POA: Diagnosis not present

## 2014-11-13 DIAGNOSIS — N186 End stage renal disease: Secondary | ICD-10-CM | POA: Diagnosis not present

## 2014-11-15 DIAGNOSIS — G5601 Carpal tunnel syndrome, right upper limb: Secondary | ICD-10-CM | POA: Diagnosis not present

## 2014-11-16 DIAGNOSIS — N2581 Secondary hyperparathyroidism of renal origin: Secondary | ICD-10-CM | POA: Diagnosis not present

## 2014-11-16 DIAGNOSIS — N186 End stage renal disease: Secondary | ICD-10-CM | POA: Diagnosis not present

## 2014-11-16 DIAGNOSIS — E119 Type 2 diabetes mellitus without complications: Secondary | ICD-10-CM | POA: Diagnosis not present

## 2014-11-18 DIAGNOSIS — E119 Type 2 diabetes mellitus without complications: Secondary | ICD-10-CM | POA: Diagnosis not present

## 2014-11-18 DIAGNOSIS — N186 End stage renal disease: Secondary | ICD-10-CM | POA: Diagnosis not present

## 2014-11-18 DIAGNOSIS — N2581 Secondary hyperparathyroidism of renal origin: Secondary | ICD-10-CM | POA: Diagnosis not present

## 2014-11-20 DIAGNOSIS — N2581 Secondary hyperparathyroidism of renal origin: Secondary | ICD-10-CM | POA: Diagnosis not present

## 2014-11-20 DIAGNOSIS — N186 End stage renal disease: Secondary | ICD-10-CM | POA: Diagnosis not present

## 2014-11-20 DIAGNOSIS — E119 Type 2 diabetes mellitus without complications: Secondary | ICD-10-CM | POA: Diagnosis not present

## 2014-11-23 DIAGNOSIS — N2581 Secondary hyperparathyroidism of renal origin: Secondary | ICD-10-CM | POA: Diagnosis not present

## 2014-11-23 DIAGNOSIS — E119 Type 2 diabetes mellitus without complications: Secondary | ICD-10-CM | POA: Diagnosis not present

## 2014-11-23 DIAGNOSIS — N186 End stage renal disease: Secondary | ICD-10-CM | POA: Diagnosis not present

## 2014-11-25 DIAGNOSIS — N2581 Secondary hyperparathyroidism of renal origin: Secondary | ICD-10-CM | POA: Diagnosis not present

## 2014-11-25 DIAGNOSIS — E119 Type 2 diabetes mellitus without complications: Secondary | ICD-10-CM | POA: Diagnosis not present

## 2014-11-25 DIAGNOSIS — N186 End stage renal disease: Secondary | ICD-10-CM | POA: Diagnosis not present

## 2014-11-27 DIAGNOSIS — N2581 Secondary hyperparathyroidism of renal origin: Secondary | ICD-10-CM | POA: Diagnosis not present

## 2014-11-27 DIAGNOSIS — N186 End stage renal disease: Secondary | ICD-10-CM | POA: Diagnosis not present

## 2014-11-27 DIAGNOSIS — E119 Type 2 diabetes mellitus without complications: Secondary | ICD-10-CM | POA: Diagnosis not present

## 2014-11-29 DIAGNOSIS — N186 End stage renal disease: Secondary | ICD-10-CM | POA: Diagnosis not present

## 2014-11-29 DIAGNOSIS — Z992 Dependence on renal dialysis: Secondary | ICD-10-CM | POA: Diagnosis not present

## 2014-11-29 DIAGNOSIS — Z4789 Encounter for other orthopedic aftercare: Secondary | ICD-10-CM | POA: Diagnosis not present

## 2014-11-30 DIAGNOSIS — E119 Type 2 diabetes mellitus without complications: Secondary | ICD-10-CM | POA: Diagnosis not present

## 2014-11-30 DIAGNOSIS — N2581 Secondary hyperparathyroidism of renal origin: Secondary | ICD-10-CM | POA: Diagnosis not present

## 2014-11-30 DIAGNOSIS — D631 Anemia in chronic kidney disease: Secondary | ICD-10-CM | POA: Diagnosis not present

## 2014-11-30 DIAGNOSIS — N186 End stage renal disease: Secondary | ICD-10-CM | POA: Diagnosis not present

## 2014-12-02 DIAGNOSIS — E119 Type 2 diabetes mellitus without complications: Secondary | ICD-10-CM | POA: Diagnosis not present

## 2014-12-02 DIAGNOSIS — N2581 Secondary hyperparathyroidism of renal origin: Secondary | ICD-10-CM | POA: Diagnosis not present

## 2014-12-02 DIAGNOSIS — D631 Anemia in chronic kidney disease: Secondary | ICD-10-CM | POA: Diagnosis not present

## 2014-12-02 DIAGNOSIS — N186 End stage renal disease: Secondary | ICD-10-CM | POA: Diagnosis not present

## 2014-12-04 DIAGNOSIS — E119 Type 2 diabetes mellitus without complications: Secondary | ICD-10-CM | POA: Diagnosis not present

## 2014-12-04 DIAGNOSIS — D631 Anemia in chronic kidney disease: Secondary | ICD-10-CM | POA: Diagnosis not present

## 2014-12-04 DIAGNOSIS — N2581 Secondary hyperparathyroidism of renal origin: Secondary | ICD-10-CM | POA: Diagnosis not present

## 2014-12-04 DIAGNOSIS — N186 End stage renal disease: Secondary | ICD-10-CM | POA: Diagnosis not present

## 2014-12-07 DIAGNOSIS — E119 Type 2 diabetes mellitus without complications: Secondary | ICD-10-CM | POA: Diagnosis not present

## 2014-12-07 DIAGNOSIS — N2581 Secondary hyperparathyroidism of renal origin: Secondary | ICD-10-CM | POA: Diagnosis not present

## 2014-12-07 DIAGNOSIS — N186 End stage renal disease: Secondary | ICD-10-CM | POA: Diagnosis not present

## 2014-12-07 DIAGNOSIS — D631 Anemia in chronic kidney disease: Secondary | ICD-10-CM | POA: Diagnosis not present

## 2014-12-08 ENCOUNTER — Encounter: Payer: Self-pay | Admitting: Internal Medicine

## 2014-12-08 ENCOUNTER — Ambulatory Visit (INDEPENDENT_AMBULATORY_CARE_PROVIDER_SITE_OTHER): Payer: Medicare Other | Admitting: Internal Medicine

## 2014-12-08 ENCOUNTER — Other Ambulatory Visit (INDEPENDENT_AMBULATORY_CARE_PROVIDER_SITE_OTHER): Payer: Medicare Other

## 2014-12-08 VITALS — BP 142/80 | HR 88 | Temp 99.0°F | Resp 20 | Ht 69.0 in | Wt 242.1 lb

## 2014-12-08 DIAGNOSIS — M79659 Pain in unspecified thigh: Secondary | ICD-10-CM | POA: Insufficient documentation

## 2014-12-08 DIAGNOSIS — G4733 Obstructive sleep apnea (adult) (pediatric): Secondary | ICD-10-CM

## 2014-12-08 DIAGNOSIS — Z9989 Dependence on other enabling machines and devices: Secondary | ICD-10-CM

## 2014-12-08 LAB — CK: Total CK: 300 U/L — ABNORMAL HIGH (ref 7–232)

## 2014-12-08 NOTE — Progress Notes (Signed)
Pre visit review using our clinic review tool, if applicable. No additional management support is needed unless otherwise documented below in the visit note. 

## 2014-12-08 NOTE — Patient Instructions (Addendum)
.  Please continue all other medications as before, and refills have been done if requested.  Please have the pharmacy call with any other refills you may need.  Please continue your efforts at being more active, low cholesterol diet, and weight control.  Please keep your appointments with your specialists as you may have planned  Please go to the LAB in the Basement (turn left off the elevator) for the tests to be done today - just the muscle test today  You will be contacted regarding the referral for: pulmonary for the sleep apnea

## 2014-12-08 NOTE — Assessment & Plan Note (Signed)
Exam benign, etiology not clear,exam ok, dementia makes hx difficult and variable,  for CPK,  to f/u any worsening symptoms or concerns

## 2014-12-08 NOTE — Assessment & Plan Note (Signed)
For pulm referral to f/u per wife request

## 2014-12-08 NOTE — Progress Notes (Signed)
Subjective:    Patient ID: [name], male    DOB: 1950/12/29, 64 y.o.   MRN: [id number]  HPI  Here to f/u, feeling worse x 2 mo, has some lower back pain, muscles seem sore to the post thighs, and sometimes lower back , but denies raidating pain. Pt denies chest pain, , wheezing, orthopnea, PND, increased LE swelling, palpitations, dizziness or syncope, except sscp pleuritic only with deep breaths, and sob/doe to do things.thinks he could walk about 1 block , but now can only get to the parking lot.   Pt denies fever, wt loss, night sweats, loss of appetite, or other constitutional symptoms  Still with HD t-th-sat.  Not taking statin.  Pt denies fever, wt loss, night sweats, loss of appetite, or other constitutional symptoms Wt Readings from Last 3 Encounters:  12/08/14 242 lb 1.9 oz (109.825 kg)  08/30/14 244 lb (110.678 kg)  07/14/14 244 lb (110.678 kg)     Past Medical History  Diagnosis Date  . Hyperthyroidism   . GASTROENTERITIS, VIRAL 10/14/2009  . ONYCHOMYCOSIS, TOENAILS 12/26/2007  . GOITER, MULTINODULAR 12/26/2007  . HYPERTHYROIDISM 02/02/2010  . DIABETES MELLITUS, TYPE II 02/01/2010  . DYSLIPIDEMIA 06/18/2007  . GOUT 06/18/2007  . Hypocalcemia 06/07/2010  . DEMENTIA 09/02/2009  . DEPRESSION 10/14/2009  . PERIPHERAL NEUROPATHY 06/18/2007  . HYPERTENSION 06/18/2007  . CONGESTIVE HEART FAILURE 06/18/2007  . DIASTOLIC HEART FAILURE, CHRONIC 02/06/2009  . Unspecified hypotension 01/30/2010  . PULMONARY NODULE, RIGHT LOWER LOBE 06/08/2009  . GERD 06/18/2007  . ESRD 08/04/2010  . BENIGN PROSTATIC HYPERTROPHY 10/14/2009  . GYNECOMASTIA 07/17/2010  . NECK PAIN 07/31/2010  . FOOT PAIN 08/12/2008  . DIZZINESS 07/17/2010  . Other malaise and fatigue 11/24/2009  . GAIT DISTURBANCE 03/03/2010  . DYSPNEA 10/29/2008  . CHEST PAIN 03/29/2010  . TRANSAMINASES, SERUM, ELEVATED 02/01/2010  . COLONIC POLYPS, HX OF 10/14/2009  . Anemia 06/16/2011  . Ischemic cardiomyopathy 06/16/2011  . Hyperparathyroidism,  secondary 06/16/2011  . OSA on CPAP 10/16/2011  . Hyperlipidemia 10/16/2011  . CAD, NATIVE VESSEL 02/06/2009    saw Dr. Missy Sabins last jan 2013  . Sleep apnea     cpap machine and o2  . Allergic rhinitis, cause unspecified 02/24/2014   Past Surgical History  Procedure Laterality Date  . Stent 06/11/08    . Neck sugury  2/09  . Back surgury  1998  . Esophagogastroduodenoscopy  09/28/2011    Procedure: ESOPHAGOGASTRODUODENOSCOPY (EGD);  Surgeon: Missy Sabins, MD;  Location: [facility] ENDOSCOPY;  Service: Endoscopy;  Laterality: N/A;  . Givens capsule study  09/30/2011    Procedure: GIVENS CAPSULE STUDY;  Surgeon: Jeryl Columbia, MD;  Location: [facility] ENDOSCOPY;  Service: Endoscopy;  Laterality: N/A;  . Cholecystectomy    . Cardiac catheterization    . Tonsillectomy    . Av fistula placement  11/07/2011    Procedure: INSERTION OF ARTERIOVENOUS (AV) GORE-TEX GRAFT ARM;  Surgeon: Tinnie Gens, MD;  Location: [place];  Service: Vascular;  Laterality: Left;  . Dialysis fistula creation  2009    right  . Insertion of dialysis catheter  2014    right side  . Insertion of dialysis catheter Left 02/11/2013    Procedure: INSERTION OF DIALYSIS CATHETER;  Surgeon: Conrad Humboldt, MD;  Location: [place];  Service: Vascular;  Laterality: Left;  Ultrasound guided  . Removal of a dialysis catheter Right 02/11/2013    Procedure: REMOVAL OF A DIALYSIS CATHETER;  Surgeon: Conrad Coyne Center, MD;  Location: [place] OR;  Service: Vascular;  Laterality: Right;  . Bascilic vein transposition Right 02/27/2013    Procedure: BASCILIC VEIN TRANSPOSITION;  Surgeon: Mal Misty, MD;  Location: [place];  Service: Vascular;  Laterality: Right;  Right Basilic Vein Transposition   . Shuntogram N/A 09/20/2011    Procedure: Earney Mallet;  Surgeon: Conrad Susquehanna, MD;  Location: [facility] CATH LAB;  Service: Cardiovascular;  Laterality: N/A;  . Venogram N/A 01/26/2013    Procedure: VENOGRAM;  Surgeon: Angelia Mould, MD;  Location: [facility] CATH LAB;  Service:  Cardiovascular;  Laterality: N/A;    reports that he has quit smoking. His smoking use included Cigarettes. He has a 25 pack-year smoking history. He has never used smokeless tobacco. He reports that he drinks alcohol. He reports that he does not use illicit drugs. family history includes Heart disease in his father and sister. Allergies  Allergen Reactions  . Cephalexin Other (See Comments)    Tongue swelling  . Statins     Weak muscles   Current Outpatient Prescriptions on File Prior to Visit  Medication Sig Dispense Refill  . allopurinol (ZYLOPRIM) 100 MG tablet Take 1 tablet (100 mg total) by mouth daily. 30 tablet 11  . amitriptyline (ELAVIL) 50 MG tablet Take 50 mg by mouth daily as needed (rest).     [name] aspirin 81 MG EC tablet Take 81 mg by mouth daily.     . calcium-vitamin D (OSCAL WITH D) 500-200 MG-UNIT per tablet Take 3 tablets by mouth 3 (three) times daily with meals.     . carvedilol (COREG) 25 MG tablet Take 0.5 tablets (12.5 mg total) by mouth 2 (two) times daily with a meal. Only on non-HD days    . cinacalcet (SENSIPAR) 60 MG tablet Take 60 mg by mouth 2 (two) times daily.     . citalopram (CELEXA) 20 MG tablet Take 1 tablet (20 mg total) by mouth daily. 90 tablet 3  . colesevelam (WELCHOL) 625 MG tablet Take 3 tablets (1,875 mg total) by mouth 2 (two) times daily with a meal. 180 tablet 11  . gabapentin (NEURONTIN) 300 MG capsule Take 1 capsule (300 mg total) by mouth at bedtime as needed. For pain 90 capsule 1  . glucose blood (FREESTYLE TEST STRIPS) test strip 1 each by Other route daily. And lancets 1/day 250.40 100 each 12  . lanthanum (FOSRENOL) 1000 MG chewable tablet Chew 1,000 mg by mouth 2 times daily at 12 noon and 4 pm.    . methimazole (TAPAZOLE) 5 MG tablet Take 1 tablet (5 mg total) by mouth daily. 30 tablet 5  . multivitamin (RENA-VIT) TABS tablet Take 1 tablet by mouth daily.      . ondansetron (ZOFRAN) 4 MG tablet Take 1 tablet (4 mg total) by mouth every  8 (eight) hours as needed for nausea or vomiting. 30 tablet 0  . ranitidine (ZANTAC) 300 MG tablet Take 300 mg by mouth at bedtime.     No current facility-administered medications on file prior to visit.   Review of Systems  Constitutional: Negative for unusual diaphoresis or night sweats HENT: Negative for ringing in ear or discharge Eyes: Negative for double vision or worsening visual disturbance.  Respiratory: Negative for choking and stridor.   Gastrointestinal: Negative for vomiting or other signifcant bowel change Genitourinary: Negative for hematuria or change in urine volume.  Musculoskeletal: Negative for other MSK pain or swelling Skin: Negative for color change and worsening wound.  Neurological: Negative  for tremors and numbness other than noted  Psychiatric/Behavioral: Negative for decreased concentration or agitation other than above       Objective:   Physical Exam BP 142/80 mmHg  Pulse 88  Temp(Src) 99 F (37.2 C) (Oral)  Resp 20  Ht 5\' 9"  (1.753 m)  Wt 242 lb 1.9 oz (109.825 kg)  BMI 35.74 kg/m2  SpO2 92% VS noted,  Constitutional: Pt appears in no significant distress HENT: Head: NCAT.  Right Ear: External ear normal.  Left Ear: External ear normal.  Eyes: . Pupils are equal, round, and reactive to light. Conjunctivae and EOM are normal Neck: Normal range of motion. Neck supple.  Cardiovascular: Normal rate and regular rhythm.   Pulmonary/Chest: Effort normal and breath sounds without rales or wheezing.  Abd:  Soft, NT, ND, + BS Neurological: Pt is alert. Not confused , motor grossly intact Skin: Skin is warm. No rash, no LE edema Psychiatric: Pt behavior is normal. No agitation.  No tender to post thighs Spine nontender    Assessment & Plan:

## 2014-12-09 DIAGNOSIS — N186 End stage renal disease: Secondary | ICD-10-CM | POA: Diagnosis not present

## 2014-12-09 DIAGNOSIS — D631 Anemia in chronic kidney disease: Secondary | ICD-10-CM | POA: Diagnosis not present

## 2014-12-09 DIAGNOSIS — E119 Type 2 diabetes mellitus without complications: Secondary | ICD-10-CM | POA: Diagnosis not present

## 2014-12-09 DIAGNOSIS — N2581 Secondary hyperparathyroidism of renal origin: Secondary | ICD-10-CM | POA: Diagnosis not present

## 2014-12-10 DIAGNOSIS — L602 Onychogryphosis: Secondary | ICD-10-CM | POA: Diagnosis not present

## 2014-12-10 DIAGNOSIS — E1151 Type 2 diabetes mellitus with diabetic peripheral angiopathy without gangrene: Secondary | ICD-10-CM | POA: Diagnosis not present

## 2014-12-11 DIAGNOSIS — E119 Type 2 diabetes mellitus without complications: Secondary | ICD-10-CM | POA: Diagnosis not present

## 2014-12-11 DIAGNOSIS — N2581 Secondary hyperparathyroidism of renal origin: Secondary | ICD-10-CM | POA: Diagnosis not present

## 2014-12-11 DIAGNOSIS — D631 Anemia in chronic kidney disease: Secondary | ICD-10-CM | POA: Diagnosis not present

## 2014-12-11 DIAGNOSIS — N186 End stage renal disease: Secondary | ICD-10-CM | POA: Diagnosis not present

## 2014-12-14 DIAGNOSIS — N2581 Secondary hyperparathyroidism of renal origin: Secondary | ICD-10-CM | POA: Diagnosis not present

## 2014-12-14 DIAGNOSIS — N186 End stage renal disease: Secondary | ICD-10-CM | POA: Diagnosis not present

## 2014-12-14 DIAGNOSIS — E119 Type 2 diabetes mellitus without complications: Secondary | ICD-10-CM | POA: Diagnosis not present

## 2014-12-14 DIAGNOSIS — D631 Anemia in chronic kidney disease: Secondary | ICD-10-CM | POA: Diagnosis not present

## 2014-12-16 DIAGNOSIS — E119 Type 2 diabetes mellitus without complications: Secondary | ICD-10-CM | POA: Diagnosis not present

## 2014-12-16 DIAGNOSIS — N2581 Secondary hyperparathyroidism of renal origin: Secondary | ICD-10-CM | POA: Diagnosis not present

## 2014-12-16 DIAGNOSIS — N186 End stage renal disease: Secondary | ICD-10-CM | POA: Diagnosis not present

## 2014-12-16 DIAGNOSIS — D631 Anemia in chronic kidney disease: Secondary | ICD-10-CM | POA: Diagnosis not present

## 2014-12-18 DIAGNOSIS — N186 End stage renal disease: Secondary | ICD-10-CM | POA: Diagnosis not present

## 2014-12-18 DIAGNOSIS — N2581 Secondary hyperparathyroidism of renal origin: Secondary | ICD-10-CM | POA: Diagnosis not present

## 2014-12-18 DIAGNOSIS — D631 Anemia in chronic kidney disease: Secondary | ICD-10-CM | POA: Diagnosis not present

## 2014-12-18 DIAGNOSIS — E119 Type 2 diabetes mellitus without complications: Secondary | ICD-10-CM | POA: Diagnosis not present

## 2014-12-21 DIAGNOSIS — D631 Anemia in chronic kidney disease: Secondary | ICD-10-CM | POA: Diagnosis not present

## 2014-12-21 DIAGNOSIS — N186 End stage renal disease: Secondary | ICD-10-CM | POA: Diagnosis not present

## 2014-12-21 DIAGNOSIS — N2581 Secondary hyperparathyroidism of renal origin: Secondary | ICD-10-CM | POA: Diagnosis not present

## 2014-12-21 DIAGNOSIS — E119 Type 2 diabetes mellitus without complications: Secondary | ICD-10-CM | POA: Diagnosis not present

## 2014-12-23 DIAGNOSIS — E119 Type 2 diabetes mellitus without complications: Secondary | ICD-10-CM | POA: Diagnosis not present

## 2014-12-23 DIAGNOSIS — N2581 Secondary hyperparathyroidism of renal origin: Secondary | ICD-10-CM | POA: Diagnosis not present

## 2014-12-23 DIAGNOSIS — D631 Anemia in chronic kidney disease: Secondary | ICD-10-CM | POA: Diagnosis not present

## 2014-12-23 DIAGNOSIS — N186 End stage renal disease: Secondary | ICD-10-CM | POA: Diagnosis not present

## 2014-12-25 DIAGNOSIS — E119 Type 2 diabetes mellitus without complications: Secondary | ICD-10-CM | POA: Diagnosis not present

## 2014-12-25 DIAGNOSIS — N186 End stage renal disease: Secondary | ICD-10-CM | POA: Diagnosis not present

## 2014-12-25 DIAGNOSIS — D631 Anemia in chronic kidney disease: Secondary | ICD-10-CM | POA: Diagnosis not present

## 2014-12-25 DIAGNOSIS — N2581 Secondary hyperparathyroidism of renal origin: Secondary | ICD-10-CM | POA: Diagnosis not present

## 2014-12-28 DIAGNOSIS — D631 Anemia in chronic kidney disease: Secondary | ICD-10-CM | POA: Diagnosis not present

## 2014-12-28 DIAGNOSIS — N186 End stage renal disease: Secondary | ICD-10-CM | POA: Diagnosis not present

## 2014-12-28 DIAGNOSIS — N2581 Secondary hyperparathyroidism of renal origin: Secondary | ICD-10-CM | POA: Diagnosis not present

## 2014-12-28 DIAGNOSIS — E119 Type 2 diabetes mellitus without complications: Secondary | ICD-10-CM | POA: Diagnosis not present

## 2014-12-29 ENCOUNTER — Ambulatory Visit (HOSPITAL_COMMUNITY)
Admission: RE | Admit: 2014-12-29 | Discharge: 2014-12-29 | Disposition: A | Discharge status: Home / Self Care | Payer: Medicare Other | Source: Ambulatory Visit | Attending: Internal Medicine | Admitting: Internal Medicine

## 2014-12-29 ENCOUNTER — Encounter (HOSPITAL_COMMUNITY): Payer: Self-pay

## 2014-12-29 VITALS — BP 142/82 | HR 85 | Resp 18 | Wt 242.0 lb

## 2014-12-29 DIAGNOSIS — N186 End stage renal disease: Secondary | ICD-10-CM | POA: Insufficient documentation

## 2014-12-29 DIAGNOSIS — I5022 Chronic systolic (congestive) heart failure: Secondary | ICD-10-CM

## 2014-12-29 DIAGNOSIS — I2510 Atherosclerotic heart disease of native coronary artery without angina pectoris: Secondary | ICD-10-CM

## 2014-12-29 DIAGNOSIS — E669 Obesity, unspecified: Secondary | ICD-10-CM | POA: Diagnosis not present

## 2014-12-29 DIAGNOSIS — I10 Essential (primary) hypertension: Secondary | ICD-10-CM | POA: Insufficient documentation

## 2014-12-29 DIAGNOSIS — I2583 Coronary atherosclerosis due to lipid rich plaque: Secondary | ICD-10-CM

## 2014-12-29 MED ORDER — EZETIMIBE 10 MG PO TABS: 10.0000 mg | ORAL_TABLET | Freq: Every day | ORAL | Status: DC

## 2014-12-29 NOTE — Addendum Note (Signed)
Encounter addended by: Scarlette Calico, RN on: 12/29/2014  2:45 PM<BR>     Documentation filed: Dx Association, Patient Instructions Section, Orders

## 2014-12-29 NOTE — Patient Instructions (Signed)
Start Zetia 10 mg daily  Your physician has requested that you have an echocardiogram. Echocardiography is a painless test that uses sound waves to create images of your heart. It provides your doctor with information about the size and shape of your heart and how well your heart's chambers and valves are working. This procedure takes approximately one hour. There are no restrictions for this procedure.  We will contact you in 6 months to schedule your next appointment.

## 2014-12-29 NOTE — Progress Notes (Signed)
Patient ID: [name], male   DOB: 21-Sep-1951, 64 y.o.   MRN: [id number]  PCP: Dr [name] Nephrologist: Dr Mercy Moore  HPI: [name] is 64 year old male with PMH: obesity, DM, COPD on nighttime oxygen, sleep apnea on CPAP, ESRD- HD, CAD, S/P Taxus drug-eluting stent to the right coronary in September [id number], chronic systolic/diastolic heart failure ECHO 6/15 EF 30% grade 2, KL (Unable to tolerate statins so placed on Welchol)  Had RHC in 2/11 which showed low pressures (PCWP = 2) , PA 26/16 (22)  and normal cardiac output.  Echo 12/12: EF 55% Myoview 10/15/12: EF 33%. LV Wall Motion: There is global hypokinesis. The LV is markedly enlarged. Small fixed apical defect.  cMRI 02/19/13: EF 37% dilated LV. diffuse HK Echo 6/15: EF 25-30%   Evaluated at [facility] for kidney transplant but he was not felt to be candidate (11/2012) due to poor mobility, DM, and coronary disease.   CPX 06/04/13   Resting HR: 76 Peak HR: 109 (70% age predicted max HR) BP rest: 107/70 BP peak: 154/69 Peak VO2: 11.8 (53.7% predicted peak VO2) - when corrected to ibw pVO2 15.7 VE/VCO2 slope: 28.8 OUES: 1.90 Peak RER: 1.06  Ve/MVV 34.7%  Follow up: Returns for f/u. Since we last saw him was seen at Kidney transplant [facility] at [facility]. Asked him to lose 20 pounds and go to Cardiac Rehab. Has not lost weight yet.  During HD BP occasionally drops low but hasn't had to come off early. On non-dialysis days BP 130-140. Feels tired and SOB with mild activity. Denies CP.  Has been taking carvedilol 25 qhs and norvasc 10 qhs. Dialysis has been keeping his edema down. No orthopnea or PND. Weight stable.    Past Medical History  Diagnosis Date  . Hyperthyroidism   . GASTROENTERITIS, VIRAL 10/14/2009  . ONYCHOMYCOSIS, TOENAILS 12/26/2007  . GOITER, MULTINODULAR 12/26/2007  . HYPERTHYROIDISM 02/02/2010  . DIABETES MELLITUS, TYPE II 02/01/2010  . DYSLIPIDEMIA 06/18/2007  . GOUT 06/18/2007  . Hypocalcemia 06/07/2010  . DEMENTIA 09/02/2009  .  DEPRESSION 10/14/2009  . PERIPHERAL NEUROPATHY 06/18/2007  . HYPERTENSION 06/18/2007  . CONGESTIVE HEART FAILURE 06/18/2007  . DIASTOLIC HEART FAILURE, CHRONIC 02/06/2009  . Unspecified hypotension 01/30/2010  . PULMONARY NODULE, RIGHT LOWER LOBE 06/08/2009  . GERD 06/18/2007  . ESRD 08/04/2010  . BENIGN PROSTATIC HYPERTROPHY 10/14/2009  . GYNECOMASTIA 07/17/2010  . NECK PAIN 07/31/2010  . FOOT PAIN 08/12/2008  . DIZZINESS 07/17/2010  . Other malaise and fatigue 11/24/2009  . GAIT DISTURBANCE 03/03/2010  . DYSPNEA 10/29/2008  . CHEST PAIN 03/29/2010  . TRANSAMINASES, SERUM, ELEVATED 02/01/2010  . COLONIC POLYPS, HX OF 10/14/2009  . Anemia 06/16/2011  . Ischemic cardiomyopathy 06/16/2011  . Hyperparathyroidism, secondary 06/16/2011  . OSA on CPAP 10/16/2011  . Hyperlipidemia 10/16/2011  . CAD, NATIVE VESSEL 02/06/2009    saw Dr. Missy Sabins last jan 2013  . Sleep apnea     cpap machine and o2  . Allergic rhinitis, cause unspecified 02/24/2014    Current Outpatient Prescriptions  Medication Sig Dispense Refill  . allopurinol (ZYLOPRIM) 100 MG tablet Take 1 tablet (100 mg total) by mouth daily. 30 tablet 11  . amitriptyline (ELAVIL) 50 MG tablet Take 50 mg by mouth daily as needed (rest).     [name] aspirin 81 MG EC tablet Take 81 mg by mouth daily.     . calcium-vitamin D (OSCAL WITH D) 500-200 MG-UNIT per tablet Take 3 tablets by mouth 3 (three) times daily with meals.     [name]  carvedilol (COREG) 25 MG tablet Take 0.5 tablets (12.5 mg total) by mouth 2 (two) times daily with a meal. Only on non-HD days (Patient taking differently: Take 25 mg by mouth daily. Only on non-HD days)    . cinacalcet (SENSIPAR) 60 MG tablet Take 60 mg by mouth 2 (two) times daily.     . citalopram (CELEXA) 20 MG tablet Take 1 tablet (20 mg total) by mouth daily. 90 tablet 3  . colesevelam (WELCHOL) 625 MG tablet Take 3 tablets (1,875 mg total) by mouth 2 (two) times daily with a meal. 180 tablet 11  . gabapentin (NEURONTIN) 300 MG  capsule Take 1 capsule (300 mg total) by mouth at bedtime as needed. For pain 90 capsule 1  . glucose blood (FREESTYLE TEST STRIPS) test strip 1 each by Other route daily. And lancets 1/day 250.40 100 each 12  . lanthanum (FOSRENOL) 1000 MG chewable tablet Chew 1,000 mg by mouth 2 times daily at 12 noon and 4 pm.    . methimazole (TAPAZOLE) 5 MG tablet Take 1 tablet (5 mg total) by mouth daily. 30 tablet 5  . multivitamin (RENA-VIT) TABS tablet Take 1 tablet by mouth daily.      . ondansetron (ZOFRAN) 4 MG tablet Take 1 tablet (4 mg total) by mouth every 8 (eight) hours as needed for nausea or vomiting. 30 tablet 0  . ranitidine (ZANTAC) 300 MG tablet Take 300 mg by mouth at bedtime.     No current facility-administered medications for this encounter.    Filed Vitals:   12/29/14 1349  BP: 142/82  Pulse: 85  Resp: 18    PHYSICAL EXAM: General:  Fatigued appearing. No resp difficulty Wife present HEENT: normal Neck: supple. JVP flat. Carotids 2+ bilaterally; no bruits. No lymphadenopathy or thryomegaly appreciated. Cor: PMI normal. Regular rate & rhythm. No rubs, gallops 2/6 SEM LUSB. L chest perm-cath Lungs: clear Abdomen: obese  soft, nontender, nondistended. No hepatosplenomegaly. No bruits or masses. Good bowel sounds. Extremities: no cyanosis, clubbing, rash, edema. + grafts in both UEs  Neuro: alert & orientedx3, cranial nerves grossly intact. Moves all 4 extremities w/o difficulty. Affect pleasant.   ASSESSMENT & PLAN:  1. Chronic Systolic Heart Fatigue:  Mixed ICM/NICM, EF 37% per c-MRI. EF 25-30% (Echo 6/15) Chronic NYHA III Volume status stable with HD. Will him take carvedilol 12.5 mg bid only on non-HD days.  Not on ACE or ARB due to ESRD. BP too low for Bidil.  Needs repeat echo.    Will refer to HF exercise program at [facility].  2. HTN - improved 3. ESRD-  Dry Weight 111.5 kg HD Tues/Thur/Sat 4. Obesity - recommended [place]. (if OK with Renal due to  protein load) 5. CAD - stable. No evidence of ischemia. Continue ASA, b-blockers. Has not been able to tolerate statins due to myositis. Will try Zetia.   Follow up in 6 months  Benay Spice 2:21 PM

## 2014-12-30 DIAGNOSIS — N2581 Secondary hyperparathyroidism of renal origin: Secondary | ICD-10-CM | POA: Diagnosis not present

## 2014-12-30 DIAGNOSIS — D631 Anemia in chronic kidney disease: Secondary | ICD-10-CM | POA: Diagnosis not present

## 2014-12-30 DIAGNOSIS — E1129 Type 2 diabetes mellitus with other diabetic kidney complication: Secondary | ICD-10-CM | POA: Diagnosis not present

## 2014-12-30 DIAGNOSIS — N186 End stage renal disease: Secondary | ICD-10-CM | POA: Diagnosis not present

## 2014-12-30 DIAGNOSIS — E119 Type 2 diabetes mellitus without complications: Secondary | ICD-10-CM | POA: Diagnosis not present

## 2014-12-30 DIAGNOSIS — Z992 Dependence on renal dialysis: Secondary | ICD-10-CM | POA: Diagnosis not present

## 2015-01-01 DIAGNOSIS — D631 Anemia in chronic kidney disease: Secondary | ICD-10-CM | POA: Diagnosis not present

## 2015-01-01 DIAGNOSIS — N186 End stage renal disease: Secondary | ICD-10-CM | POA: Diagnosis not present

## 2015-01-01 DIAGNOSIS — E119 Type 2 diabetes mellitus without complications: Secondary | ICD-10-CM | POA: Diagnosis not present

## 2015-01-01 DIAGNOSIS — N2581 Secondary hyperparathyroidism of renal origin: Secondary | ICD-10-CM | POA: Diagnosis not present

## 2015-01-03 DIAGNOSIS — Z4789 Encounter for other orthopedic aftercare: Secondary | ICD-10-CM | POA: Diagnosis not present

## 2015-01-04 ENCOUNTER — Emergency Department (HOSPITAL_COMMUNITY): Payer: Medicare Other

## 2015-01-04 ENCOUNTER — Emergency Department (HOSPITAL_COMMUNITY)
Admission: EM | Admit: 2015-01-04 | Discharge: 2015-01-04 | Discharge status: Against Medical Advice | Payer: Medicare Other | Attending: Emergency Medicine | Admitting: Emergency Medicine

## 2015-01-04 ENCOUNTER — Encounter (HOSPITAL_COMMUNITY): Payer: Self-pay | Admitting: Emergency Medicine

## 2015-01-04 DIAGNOSIS — F0390 Unspecified dementia without behavioral disturbance: Secondary | ICD-10-CM | POA: Diagnosis not present

## 2015-01-04 DIAGNOSIS — E119 Type 2 diabetes mellitus without complications: Secondary | ICD-10-CM | POA: Diagnosis not present

## 2015-01-04 DIAGNOSIS — W1839XA Other fall on same level, initial encounter: Secondary | ICD-10-CM | POA: Diagnosis not present

## 2015-01-04 DIAGNOSIS — Z9981 Dependence on supplemental oxygen: Secondary | ICD-10-CM | POA: Diagnosis not present

## 2015-01-04 DIAGNOSIS — I2510 Atherosclerotic heart disease of native coronary artery without angina pectoris: Secondary | ICD-10-CM | POA: Insufficient documentation

## 2015-01-04 DIAGNOSIS — I120 Hypertensive chronic kidney disease with stage 5 chronic kidney disease or end stage renal disease: Secondary | ICD-10-CM | POA: Insufficient documentation

## 2015-01-04 DIAGNOSIS — Y9389 Activity, other specified: Secondary | ICD-10-CM | POA: Insufficient documentation

## 2015-01-04 DIAGNOSIS — N186 End stage renal disease: Secondary | ICD-10-CM | POA: Diagnosis not present

## 2015-01-04 DIAGNOSIS — I5032 Chronic diastolic (congestive) heart failure: Secondary | ICD-10-CM | POA: Insufficient documentation

## 2015-01-04 DIAGNOSIS — G4733 Obstructive sleep apnea (adult) (pediatric): Secondary | ICD-10-CM | POA: Insufficient documentation

## 2015-01-04 DIAGNOSIS — N2581 Secondary hyperparathyroidism of renal origin: Secondary | ICD-10-CM | POA: Diagnosis not present

## 2015-01-04 DIAGNOSIS — S8991XA Unspecified injury of right lower leg, initial encounter: Secondary | ICD-10-CM | POA: Diagnosis not present

## 2015-01-04 DIAGNOSIS — Y9289 Other specified places as the place of occurrence of the external cause: Secondary | ICD-10-CM | POA: Diagnosis not present

## 2015-01-04 DIAGNOSIS — Z992 Dependence on renal dialysis: Secondary | ICD-10-CM | POA: Diagnosis not present

## 2015-01-04 DIAGNOSIS — D631 Anemia in chronic kidney disease: Secondary | ICD-10-CM | POA: Diagnosis not present

## 2015-01-04 DIAGNOSIS — Y998 Other external cause status: Secondary | ICD-10-CM | POA: Insufficient documentation

## 2015-01-04 DIAGNOSIS — M25561 Pain in right knee: Secondary | ICD-10-CM | POA: Diagnosis not present

## 2015-01-04 DIAGNOSIS — Z87891 Personal history of nicotine dependence: Secondary | ICD-10-CM | POA: Insufficient documentation

## 2015-01-04 NOTE — ED Notes (Signed)
The patient said he fell earlier today and now he cannot put any weight on his right knee.  The patient said he is not able to walk because he knee in hurting him.  He rates his pain 10/10 if he tries to put any weight on it.  If he sits it is 0/10.

## 2015-01-04 NOTE — ED Notes (Signed)
The patient said he was not waiting any longer and said he was going home.  I did let the patient know we had two beds that were going to be open shortly and he did not want to wait.

## 2015-01-04 NOTE — ED Notes (Addendum)
The patient said he was not waiting any longer and said he was going home.  I did let the patient know we had two beds that were going to be open shortly and he did not want to wait.

## 2015-01-05 DIAGNOSIS — M1711 Unilateral primary osteoarthritis, right knee: Secondary | ICD-10-CM | POA: Diagnosis not present

## 2015-01-06 DIAGNOSIS — N186 End stage renal disease: Secondary | ICD-10-CM | POA: Diagnosis not present

## 2015-01-06 DIAGNOSIS — N2581 Secondary hyperparathyroidism of renal origin: Secondary | ICD-10-CM | POA: Diagnosis not present

## 2015-01-06 DIAGNOSIS — E119 Type 2 diabetes mellitus without complications: Secondary | ICD-10-CM | POA: Diagnosis not present

## 2015-01-06 DIAGNOSIS — D631 Anemia in chronic kidney disease: Secondary | ICD-10-CM | POA: Diagnosis not present

## 2015-01-07 ENCOUNTER — Other Ambulatory Visit: Payer: Self-pay | Admitting: Internal Medicine

## 2015-01-08 DIAGNOSIS — D631 Anemia in chronic kidney disease: Secondary | ICD-10-CM | POA: Diagnosis not present

## 2015-01-08 DIAGNOSIS — N186 End stage renal disease: Secondary | ICD-10-CM | POA: Diagnosis not present

## 2015-01-08 DIAGNOSIS — N2581 Secondary hyperparathyroidism of renal origin: Secondary | ICD-10-CM | POA: Diagnosis not present

## 2015-01-08 DIAGNOSIS — E119 Type 2 diabetes mellitus without complications: Secondary | ICD-10-CM | POA: Diagnosis not present

## 2015-01-10 ENCOUNTER — Other Ambulatory Visit: Payer: Self-pay

## 2015-01-10 MED ORDER — GLUCOSE BLOOD VI STRP: 1.0000 | ORAL_STRIP | Freq: Every day | Status: DC

## 2015-01-11 DIAGNOSIS — E119 Type 2 diabetes mellitus without complications: Secondary | ICD-10-CM | POA: Diagnosis not present

## 2015-01-11 DIAGNOSIS — N2581 Secondary hyperparathyroidism of renal origin: Secondary | ICD-10-CM | POA: Diagnosis not present

## 2015-01-11 DIAGNOSIS — N186 End stage renal disease: Secondary | ICD-10-CM | POA: Diagnosis not present

## 2015-01-11 DIAGNOSIS — D631 Anemia in chronic kidney disease: Secondary | ICD-10-CM | POA: Diagnosis not present

## 2015-01-12 ENCOUNTER — Ambulatory Visit (HOSPITAL_COMMUNITY)
Admission: RE | Admit: 2015-01-12 | Discharge: 2015-01-12 | Disposition: A | Discharge status: Home / Self Care | Payer: Medicare Other | Source: Ambulatory Visit | Attending: Internal Medicine | Admitting: Internal Medicine

## 2015-01-12 DIAGNOSIS — I501 Left ventricular failure: Secondary | ICD-10-CM | POA: Diagnosis not present

## 2015-01-12 DIAGNOSIS — I5022 Chronic systolic (congestive) heart failure: Secondary | ICD-10-CM | POA: Diagnosis not present

## 2015-01-12 NOTE — Progress Notes (Signed)
Echocardiogram 2D Echocardiogram has been performed.  [name] 01/12/2015, 2:45 PM

## 2015-01-13 DIAGNOSIS — E119 Type 2 diabetes mellitus without complications: Secondary | ICD-10-CM | POA: Diagnosis not present

## 2015-01-13 DIAGNOSIS — N186 End stage renal disease: Secondary | ICD-10-CM | POA: Diagnosis not present

## 2015-01-13 DIAGNOSIS — D631 Anemia in chronic kidney disease: Secondary | ICD-10-CM | POA: Diagnosis not present

## 2015-01-13 DIAGNOSIS — N2581 Secondary hyperparathyroidism of renal origin: Secondary | ICD-10-CM | POA: Diagnosis not present

## 2015-01-15 DIAGNOSIS — D631 Anemia in chronic kidney disease: Secondary | ICD-10-CM | POA: Diagnosis not present

## 2015-01-15 DIAGNOSIS — E119 Type 2 diabetes mellitus without complications: Secondary | ICD-10-CM | POA: Diagnosis not present

## 2015-01-15 DIAGNOSIS — N2581 Secondary hyperparathyroidism of renal origin: Secondary | ICD-10-CM | POA: Diagnosis not present

## 2015-01-15 DIAGNOSIS — N186 End stage renal disease: Secondary | ICD-10-CM | POA: Diagnosis not present

## 2015-01-18 DIAGNOSIS — N186 End stage renal disease: Secondary | ICD-10-CM | POA: Diagnosis not present

## 2015-01-18 DIAGNOSIS — E119 Type 2 diabetes mellitus without complications: Secondary | ICD-10-CM | POA: Diagnosis not present

## 2015-01-18 DIAGNOSIS — N2581 Secondary hyperparathyroidism of renal origin: Secondary | ICD-10-CM | POA: Diagnosis not present

## 2015-01-18 DIAGNOSIS — D631 Anemia in chronic kidney disease: Secondary | ICD-10-CM | POA: Diagnosis not present

## 2015-01-19 DIAGNOSIS — T82858D Stenosis of vascular prosthetic devices, implants and grafts, subsequent encounter: Secondary | ICD-10-CM | POA: Diagnosis not present

## 2015-01-19 DIAGNOSIS — Z992 Dependence on renal dialysis: Secondary | ICD-10-CM | POA: Diagnosis not present

## 2015-01-19 DIAGNOSIS — I871 Compression of vein: Secondary | ICD-10-CM | POA: Diagnosis not present

## 2015-01-19 DIAGNOSIS — N186 End stage renal disease: Secondary | ICD-10-CM | POA: Diagnosis not present

## 2015-01-20 DIAGNOSIS — D631 Anemia in chronic kidney disease: Secondary | ICD-10-CM | POA: Diagnosis not present

## 2015-01-20 DIAGNOSIS — E119 Type 2 diabetes mellitus without complications: Secondary | ICD-10-CM | POA: Diagnosis not present

## 2015-01-20 DIAGNOSIS — N2581 Secondary hyperparathyroidism of renal origin: Secondary | ICD-10-CM | POA: Diagnosis not present

## 2015-01-20 DIAGNOSIS — N186 End stage renal disease: Secondary | ICD-10-CM | POA: Diagnosis not present

## 2015-01-22 DIAGNOSIS — N2581 Secondary hyperparathyroidism of renal origin: Secondary | ICD-10-CM | POA: Diagnosis not present

## 2015-01-22 DIAGNOSIS — N186 End stage renal disease: Secondary | ICD-10-CM | POA: Diagnosis not present

## 2015-01-22 DIAGNOSIS — E119 Type 2 diabetes mellitus without complications: Secondary | ICD-10-CM | POA: Diagnosis not present

## 2015-01-22 DIAGNOSIS — D631 Anemia in chronic kidney disease: Secondary | ICD-10-CM | POA: Diagnosis not present

## 2015-01-25 DIAGNOSIS — N2581 Secondary hyperparathyroidism of renal origin: Secondary | ICD-10-CM | POA: Diagnosis not present

## 2015-01-25 DIAGNOSIS — E119 Type 2 diabetes mellitus without complications: Secondary | ICD-10-CM | POA: Diagnosis not present

## 2015-01-25 DIAGNOSIS — D631 Anemia in chronic kidney disease: Secondary | ICD-10-CM | POA: Diagnosis not present

## 2015-01-25 DIAGNOSIS — N186 End stage renal disease: Secondary | ICD-10-CM | POA: Diagnosis not present

## 2015-01-27 DIAGNOSIS — N2581 Secondary hyperparathyroidism of renal origin: Secondary | ICD-10-CM | POA: Diagnosis not present

## 2015-01-27 DIAGNOSIS — D631 Anemia in chronic kidney disease: Secondary | ICD-10-CM | POA: Diagnosis not present

## 2015-01-27 DIAGNOSIS — N186 End stage renal disease: Secondary | ICD-10-CM | POA: Diagnosis not present

## 2015-01-27 DIAGNOSIS — E119 Type 2 diabetes mellitus without complications: Secondary | ICD-10-CM | POA: Diagnosis not present

## 2015-01-29 DIAGNOSIS — E1129 Type 2 diabetes mellitus with other diabetic kidney complication: Secondary | ICD-10-CM | POA: Diagnosis not present

## 2015-01-29 DIAGNOSIS — N186 End stage renal disease: Secondary | ICD-10-CM | POA: Diagnosis not present

## 2015-01-29 DIAGNOSIS — N2581 Secondary hyperparathyroidism of renal origin: Secondary | ICD-10-CM | POA: Diagnosis not present

## 2015-01-29 DIAGNOSIS — E119 Type 2 diabetes mellitus without complications: Secondary | ICD-10-CM | POA: Diagnosis not present

## 2015-01-29 DIAGNOSIS — Z992 Dependence on renal dialysis: Secondary | ICD-10-CM | POA: Diagnosis not present

## 2015-01-29 DIAGNOSIS — D631 Anemia in chronic kidney disease: Secondary | ICD-10-CM | POA: Diagnosis not present

## 2015-01-31 DIAGNOSIS — Z4789 Encounter for other orthopedic aftercare: Secondary | ICD-10-CM | POA: Diagnosis not present

## 2015-02-01 DIAGNOSIS — D509 Iron deficiency anemia, unspecified: Secondary | ICD-10-CM | POA: Diagnosis not present

## 2015-02-01 DIAGNOSIS — E119 Type 2 diabetes mellitus without complications: Secondary | ICD-10-CM | POA: Diagnosis not present

## 2015-02-01 DIAGNOSIS — N186 End stage renal disease: Secondary | ICD-10-CM | POA: Diagnosis not present

## 2015-02-01 DIAGNOSIS — N2581 Secondary hyperparathyroidism of renal origin: Secondary | ICD-10-CM | POA: Diagnosis not present

## 2015-02-02 ENCOUNTER — Institutional Professional Consult (permissible substitution): Payer: Medicare Other | Admitting: Pulmonary Disease

## 2015-02-03 DIAGNOSIS — N2581 Secondary hyperparathyroidism of renal origin: Secondary | ICD-10-CM | POA: Diagnosis not present

## 2015-02-03 DIAGNOSIS — E119 Type 2 diabetes mellitus without complications: Secondary | ICD-10-CM | POA: Diagnosis not present

## 2015-02-03 DIAGNOSIS — D509 Iron deficiency anemia, unspecified: Secondary | ICD-10-CM | POA: Diagnosis not present

## 2015-02-03 DIAGNOSIS — N186 End stage renal disease: Secondary | ICD-10-CM | POA: Diagnosis not present

## 2015-02-05 DIAGNOSIS — E119 Type 2 diabetes mellitus without complications: Secondary | ICD-10-CM | POA: Diagnosis not present

## 2015-02-05 DIAGNOSIS — D509 Iron deficiency anemia, unspecified: Secondary | ICD-10-CM | POA: Diagnosis not present

## 2015-02-05 DIAGNOSIS — N2581 Secondary hyperparathyroidism of renal origin: Secondary | ICD-10-CM | POA: Diagnosis not present

## 2015-02-05 DIAGNOSIS — N186 End stage renal disease: Secondary | ICD-10-CM | POA: Diagnosis not present

## 2015-02-08 DIAGNOSIS — E119 Type 2 diabetes mellitus without complications: Secondary | ICD-10-CM | POA: Diagnosis not present

## 2015-02-08 DIAGNOSIS — D509 Iron deficiency anemia, unspecified: Secondary | ICD-10-CM | POA: Diagnosis not present

## 2015-02-08 DIAGNOSIS — N2581 Secondary hyperparathyroidism of renal origin: Secondary | ICD-10-CM | POA: Diagnosis not present

## 2015-02-08 DIAGNOSIS — N186 End stage renal disease: Secondary | ICD-10-CM | POA: Diagnosis not present

## 2015-02-10 DIAGNOSIS — D509 Iron deficiency anemia, unspecified: Secondary | ICD-10-CM | POA: Diagnosis not present

## 2015-02-10 DIAGNOSIS — N186 End stage renal disease: Secondary | ICD-10-CM | POA: Diagnosis not present

## 2015-02-10 DIAGNOSIS — N2581 Secondary hyperparathyroidism of renal origin: Secondary | ICD-10-CM | POA: Diagnosis not present

## 2015-02-10 DIAGNOSIS — E119 Type 2 diabetes mellitus without complications: Secondary | ICD-10-CM | POA: Diagnosis not present

## 2015-02-11 ENCOUNTER — Encounter (INDEPENDENT_AMBULATORY_CARE_PROVIDER_SITE_OTHER): Payer: Self-pay

## 2015-02-11 ENCOUNTER — Ambulatory Visit (INDEPENDENT_AMBULATORY_CARE_PROVIDER_SITE_OTHER): Payer: Medicare Other | Admitting: Pulmonary Disease

## 2015-02-11 ENCOUNTER — Encounter: Payer: Self-pay | Admitting: Pulmonary Disease

## 2015-02-11 DIAGNOSIS — G4733 Obstructive sleep apnea (adult) (pediatric): Secondary | ICD-10-CM | POA: Diagnosis not present

## 2015-02-11 NOTE — Progress Notes (Signed)
Subjective:    Patient ID: [name], male    DOB: Feb 09, 1951, 64 y.o.   MRN: [id number]  HPI The patient is a 64 year old male who I've been asked to see for management of obstructive sleep apnea. The patient was diagnosed many years ago with significant sleep apnea, and unfortunately his sleep study is not available currently. I have apparently seen the patient in the distant past for his sleep apnea, and he feels that he has done well on C Pap since that time. His machine is very aged, and started making noises and quit working approximately 2 months ago. Since that time, he has had loud snoring as well as witnessed apneas by his bed partner. He no longer feels rested in the mornings upon arising, and does have some inappropriate daytime sleepiness at times. The patient's Epworth score is 8 today.   Sleep Questionnaire What time do you typically go to bed?( Between what hours) 1am 1am at 1609 on 02/11/15 by Glean Hess, CMA How long does it take you to fall asleep? 30 min 30 min at 1609 on 02/11/15 by Glean Hess, CMA How many times during the night do you wake up? 2 2 at 1609 on 02/11/15 by Glean Hess, CMA What time do you get out of bed to start your day? 0900 0900 at 1609 on 02/11/15 by Glean Hess, CMA Do you drive or operate heavy machinery in your occupation? No No at 1609 on 02/11/15 by Glean Hess, CMA How much has your weight changed (up or down) over the past two years? (In pounds) 10 lb (4.536 kg) 10 lb (4.536 kg) at 1609 on 02/11/15 by Glean Hess, CMA Have you ever had a sleep study before? Yes Yes at 1609 on 02/11/15 by Glean Hess, CMA If yes, location of study? [place] at 1609 on 02/11/15 by Glean Hess, CMA If yes, date of study? more than 5 years ago more than 5 years ago at 50 on 02/11/15 by Glean Hess, CMA Do you currently use CPAP? Yes Yes at 1609 on 02/11/15 by Glean Hess, CMA If so, what pressure? Currently  broken Currently broken at 1609 on 02/11/15 by Glean Hess, CMA Do you wear oxygen at any time? No No at 1609 on 02/11/15 by Glean Hess, CMA   Review of Systems  Constitutional: Negative for fever and unexpected weight change.  HENT: Negative for congestion, dental problem, ear pain, nosebleeds, postnasal drip, rhinorrhea, sinus pressure, sneezing, sore throat and trouble swallowing.   Eyes: Negative for redness and itching.  Respiratory: Negative for cough, chest tightness, shortness of breath and wheezing.   Cardiovascular: Negative for palpitations and leg swelling.  Gastrointestinal: Negative for nausea and vomiting.  Genitourinary: Negative for dysuria.  Musculoskeletal: Negative for joint swelling.  Skin: Negative for rash.  Neurological: Negative for headaches.  Hematological: Does not bruise/bleed easily.  Psychiatric/Behavioral: Negative for dysphoric mood. The patient is not nervous/anxious.        Objective:   Physical Exam Constitutional:  Obese male, no acute distress  HENT:  Nares patent without discharge  Oropharynx without exudate, palate and uvula thick and elongated  Eyes:  Perrla, eomi, no scleral icterus  Neck:  No JVD, no TMG  Cardiovascular:  Normal rate, regular rhythm, no rubs or gallops.  No murmurs        Intact distal pulses  Pulmonary :  Normal breath sounds, no stridor or  respiratory distress   No rales, rhonchi, or wheezing  Abdominal:  Soft, nondistended, bowel sounds present.  No tenderness noted.   Musculoskeletal:  mild lower extremity edema noted.  Lymph Nodes:  No cervical lymphadenopathy noted  Skin:  No cyanosis noted  Neurologic:  Alert, appropriate, moves all 4 extremities without obvious deficit.         Assessment & Plan:

## 2015-02-11 NOTE — Assessment & Plan Note (Signed)
The patient has a history of long-standing obstructive sleep apnea, and has done very well with C Pap over the years. His machine is old, and started making a lot of noises and then quit working a few months ago. He has had very disrupted sleep since that time, along with snoring and witnessed apneas. He will obviously need to get back on a positive pressure device. I will send an order to his home care company, and I've also encouraged him to work aggressively on weight loss.

## 2015-02-11 NOTE — Patient Instructions (Signed)
Will get you a new machine, and use the auto setting to optimize your pressure. Work on weight loss followup on a yearly basis.

## 2015-02-12 DIAGNOSIS — N186 End stage renal disease: Secondary | ICD-10-CM | POA: Diagnosis not present

## 2015-02-12 DIAGNOSIS — D509 Iron deficiency anemia, unspecified: Secondary | ICD-10-CM | POA: Diagnosis not present

## 2015-02-12 DIAGNOSIS — N2581 Secondary hyperparathyroidism of renal origin: Secondary | ICD-10-CM | POA: Diagnosis not present

## 2015-02-12 DIAGNOSIS — E119 Type 2 diabetes mellitus without complications: Secondary | ICD-10-CM | POA: Diagnosis not present

## 2015-02-15 DIAGNOSIS — E119 Type 2 diabetes mellitus without complications: Secondary | ICD-10-CM | POA: Diagnosis not present

## 2015-02-15 DIAGNOSIS — N186 End stage renal disease: Secondary | ICD-10-CM | POA: Diagnosis not present

## 2015-02-15 DIAGNOSIS — D509 Iron deficiency anemia, unspecified: Secondary | ICD-10-CM | POA: Diagnosis not present

## 2015-02-15 DIAGNOSIS — N2581 Secondary hyperparathyroidism of renal origin: Secondary | ICD-10-CM | POA: Diagnosis not present

## 2015-02-17 DIAGNOSIS — E119 Type 2 diabetes mellitus without complications: Secondary | ICD-10-CM | POA: Diagnosis not present

## 2015-02-17 DIAGNOSIS — N2581 Secondary hyperparathyroidism of renal origin: Secondary | ICD-10-CM | POA: Diagnosis not present

## 2015-02-17 DIAGNOSIS — D509 Iron deficiency anemia, unspecified: Secondary | ICD-10-CM | POA: Diagnosis not present

## 2015-02-17 DIAGNOSIS — N186 End stage renal disease: Secondary | ICD-10-CM | POA: Diagnosis not present

## 2015-02-19 DIAGNOSIS — D509 Iron deficiency anemia, unspecified: Secondary | ICD-10-CM | POA: Diagnosis not present

## 2015-02-19 DIAGNOSIS — E119 Type 2 diabetes mellitus without complications: Secondary | ICD-10-CM | POA: Diagnosis not present

## 2015-02-19 DIAGNOSIS — N186 End stage renal disease: Secondary | ICD-10-CM | POA: Diagnosis not present

## 2015-02-19 DIAGNOSIS — N2581 Secondary hyperparathyroidism of renal origin: Secondary | ICD-10-CM | POA: Diagnosis not present

## 2015-02-22 DIAGNOSIS — N2581 Secondary hyperparathyroidism of renal origin: Secondary | ICD-10-CM | POA: Diagnosis not present

## 2015-02-22 DIAGNOSIS — E119 Type 2 diabetes mellitus without complications: Secondary | ICD-10-CM | POA: Diagnosis not present

## 2015-02-22 DIAGNOSIS — D509 Iron deficiency anemia, unspecified: Secondary | ICD-10-CM | POA: Diagnosis not present

## 2015-02-22 DIAGNOSIS — N186 End stage renal disease: Secondary | ICD-10-CM | POA: Diagnosis not present

## 2015-02-24 DIAGNOSIS — N186 End stage renal disease: Secondary | ICD-10-CM | POA: Diagnosis not present

## 2015-02-24 DIAGNOSIS — D509 Iron deficiency anemia, unspecified: Secondary | ICD-10-CM | POA: Diagnosis not present

## 2015-02-24 DIAGNOSIS — E119 Type 2 diabetes mellitus without complications: Secondary | ICD-10-CM | POA: Diagnosis not present

## 2015-02-24 DIAGNOSIS — N2581 Secondary hyperparathyroidism of renal origin: Secondary | ICD-10-CM | POA: Diagnosis not present

## 2015-02-26 DIAGNOSIS — E119 Type 2 diabetes mellitus without complications: Secondary | ICD-10-CM | POA: Diagnosis not present

## 2015-02-26 DIAGNOSIS — N2581 Secondary hyperparathyroidism of renal origin: Secondary | ICD-10-CM | POA: Diagnosis not present

## 2015-02-26 DIAGNOSIS — D509 Iron deficiency anemia, unspecified: Secondary | ICD-10-CM | POA: Diagnosis not present

## 2015-02-26 DIAGNOSIS — N186 End stage renal disease: Secondary | ICD-10-CM | POA: Diagnosis not present

## 2015-03-01 DIAGNOSIS — E1129 Type 2 diabetes mellitus with other diabetic kidney complication: Secondary | ICD-10-CM | POA: Diagnosis not present

## 2015-03-01 DIAGNOSIS — Z992 Dependence on renal dialysis: Secondary | ICD-10-CM | POA: Diagnosis not present

## 2015-03-01 DIAGNOSIS — E119 Type 2 diabetes mellitus without complications: Secondary | ICD-10-CM | POA: Diagnosis not present

## 2015-03-01 DIAGNOSIS — N2581 Secondary hyperparathyroidism of renal origin: Secondary | ICD-10-CM | POA: Diagnosis not present

## 2015-03-01 DIAGNOSIS — D509 Iron deficiency anemia, unspecified: Secondary | ICD-10-CM | POA: Diagnosis not present

## 2015-03-01 DIAGNOSIS — N186 End stage renal disease: Secondary | ICD-10-CM | POA: Diagnosis not present

## 2015-03-03 DIAGNOSIS — E119 Type 2 diabetes mellitus without complications: Secondary | ICD-10-CM | POA: Diagnosis not present

## 2015-03-03 DIAGNOSIS — N186 End stage renal disease: Secondary | ICD-10-CM | POA: Diagnosis not present

## 2015-03-04 ENCOUNTER — Telehealth: Payer: Self-pay

## 2015-03-04 DIAGNOSIS — M1711 Unilateral primary osteoarthritis, right knee: Secondary | ICD-10-CM | POA: Diagnosis not present

## 2015-03-04 NOTE — Telephone Encounter (Signed)
Error

## 2015-03-05 DIAGNOSIS — E119 Type 2 diabetes mellitus without complications: Secondary | ICD-10-CM | POA: Diagnosis not present

## 2015-03-05 DIAGNOSIS — N186 End stage renal disease: Secondary | ICD-10-CM | POA: Diagnosis not present

## 2015-03-08 ENCOUNTER — Telehealth (HOSPITAL_COMMUNITY): Payer: Self-pay | Admitting: *Deleted

## 2015-03-08 DIAGNOSIS — E119 Type 2 diabetes mellitus without complications: Secondary | ICD-10-CM | POA: Diagnosis not present

## 2015-03-08 DIAGNOSIS — N186 End stage renal disease: Secondary | ICD-10-CM | POA: Diagnosis not present

## 2015-03-08 NOTE — Telephone Encounter (Signed)
Received fax from [facility] pt needs clearance for right total knee replacement, per Dr [name] pt cleared from a cardiac standpoint, note faxed back to [facility] at [phone]

## 2015-03-10 DIAGNOSIS — E119 Type 2 diabetes mellitus without complications: Secondary | ICD-10-CM | POA: Diagnosis not present

## 2015-03-10 DIAGNOSIS — N186 End stage renal disease: Secondary | ICD-10-CM | POA: Diagnosis not present

## 2015-03-11 ENCOUNTER — Encounter: Payer: Self-pay | Admitting: Endocrinology

## 2015-03-11 ENCOUNTER — Ambulatory Visit (INDEPENDENT_AMBULATORY_CARE_PROVIDER_SITE_OTHER): Payer: Medicare Other | Admitting: Endocrinology

## 2015-03-11 VITALS — BP 152/80 | HR 87 | Temp 98.8°F | Wt 240.0 lb

## 2015-03-11 DIAGNOSIS — N189 Chronic kidney disease, unspecified: Secondary | ICD-10-CM | POA: Diagnosis not present

## 2015-03-11 DIAGNOSIS — E0590 Thyrotoxicosis, unspecified without thyrotoxic crisis or storm: Secondary | ICD-10-CM

## 2015-03-11 DIAGNOSIS — E1129 Type 2 diabetes mellitus with other diabetic kidney complication: Secondary | ICD-10-CM | POA: Diagnosis not present

## 2015-03-11 DIAGNOSIS — I2510 Atherosclerotic heart disease of native coronary artery without angina pectoris: Secondary | ICD-10-CM | POA: Diagnosis not present

## 2015-03-11 DIAGNOSIS — E1122 Type 2 diabetes mellitus with diabetic chronic kidney disease: Secondary | ICD-10-CM | POA: Diagnosis not present

## 2015-03-11 DIAGNOSIS — E291 Testicular hypofunction: Secondary | ICD-10-CM

## 2015-03-11 LAB — HEMOGLOBIN A1C: HEMOGLOBIN A1C: 7.5 % — AB (ref 4.6–6.5)

## 2015-03-11 LAB — TSH: TSH: 0.81 u[IU]/mL (ref 0.35–4.50)

## 2015-03-11 NOTE — Progress Notes (Signed)
Subjective:    Patient ID: [name], male    DOB: 10/07/1950, 64 y.o.   MRN: [id number]  HPI The state of at least three ongoing medical problems is addressed today: Pt returns for f/u of diabetes mellitus: DM type: 2 Dx'ed: [id number] Complications: polyneuropathy, renal failure, and CAD Therapy: welchol DKA: never Severe hypoglycemia: never.  Pancreatitis: never Other: med needs has decreased with worsening of renal function.  Interval history:  no cbg record, but states cbg's are well-controlled.  He denies hypoglycemia Hyperthyroidism (due to a multinodular goiter; dx'ed 2009; bx then was benign; nuc med scan showed very low uptake, so tapazole was chosen as rx).  He still has fatigue.  HTN: he hasn't recently taken coreg.   Past Medical History  Diagnosis Date  . Hyperthyroidism   . GASTROENTERITIS, VIRAL 10/14/2009  . ONYCHOMYCOSIS, TOENAILS 12/26/2007  . GOITER, MULTINODULAR 12/26/2007  . HYPERTHYROIDISM 02/02/2010  . DIABETES MELLITUS, TYPE II 02/01/2010  . DYSLIPIDEMIA 06/18/2007  . GOUT 06/18/2007  . Hypocalcemia 06/07/2010  . DEMENTIA 09/02/2009  . DEPRESSION 10/14/2009  . PERIPHERAL NEUROPATHY 06/18/2007  . HYPERTENSION 06/18/2007  . CONGESTIVE HEART FAILURE 06/18/2007  . DIASTOLIC HEART FAILURE, CHRONIC 02/06/2009  . Unspecified hypotension 01/30/2010  . PULMONARY NODULE, RIGHT LOWER LOBE 06/08/2009  . GERD 06/18/2007  . ESRD 08/04/2010  . BENIGN PROSTATIC HYPERTROPHY 10/14/2009  . GYNECOMASTIA 07/17/2010  . NECK PAIN 07/31/2010  . FOOT PAIN 08/12/2008  . DIZZINESS 07/17/2010  . Other malaise and fatigue 11/24/2009  . GAIT DISTURBANCE 03/03/2010  . DYSPNEA 10/29/2008  . CHEST PAIN 03/29/2010  . TRANSAMINASES, SERUM, ELEVATED 02/01/2010  . COLONIC POLYPS, HX OF 10/14/2009  . Anemia 06/16/2011  . Ischemic cardiomyopathy 06/16/2011  . Hyperparathyroidism, secondary 06/16/2011  . OSA on CPAP 10/16/2011  . Hyperlipidemia 10/16/2011  . CAD, NATIVE VESSEL 02/06/2009    saw Dr. Missy Sabins last  jan 2013  . Sleep apnea     cpap machine and o2  . Allergic rhinitis, cause unspecified 02/24/2014    Past Surgical History  Procedure Laterality Date  . Stent 06/11/08    . Neck sugury  2/09  . Back surgury  1998  . Esophagogastroduodenoscopy  09/28/2011    Procedure: ESOPHAGOGASTRODUODENOSCOPY (EGD);  Surgeon: Missy Sabins, MD;  Location: [facility] ENDOSCOPY;  Service: Endoscopy;  Laterality: N/A;  . Givens capsule study  09/30/2011    Procedure: GIVENS CAPSULE STUDY;  Surgeon: Jeryl Columbia, MD;  Location: [facility] ENDOSCOPY;  Service: Endoscopy;  Laterality: N/A;  . Cholecystectomy    . Cardiac catheterization    . Tonsillectomy    . Av fistula placement  11/07/2011    Procedure: INSERTION OF ARTERIOVENOUS (AV) GORE-TEX GRAFT ARM;  Surgeon: Tinnie Gens, MD;  Location: [place];  Service: Vascular;  Laterality: Left;  . Dialysis fistula creation  2009    right  . Insertion of dialysis catheter  2014    right side  . Insertion of dialysis catheter Left 02/11/2013    Procedure: INSERTION OF DIALYSIS CATHETER;  Surgeon: Conrad Metamora, MD;  Location: [place];  Service: Vascular;  Laterality: Left;  Ultrasound guided  . Removal of a dialysis catheter Right 02/11/2013    Procedure: REMOVAL OF A DIALYSIS CATHETER;  Surgeon: Conrad Lyle, MD;  Location: [place];  Service: Vascular;  Laterality: Right;  . Bascilic vein transposition Right 02/27/2013    Procedure: BASCILIC VEIN TRANSPOSITION;  Surgeon: Mal Misty, MD;  Location: [place];  Service: Vascular;  Laterality: Right;  Right Basilic Vein Transposition   . Shuntogram N/A 09/20/2011    Procedure: Earney Mallet;  Surgeon: Conrad [place] O[place]pe, MD;  Location: [facility] CATH LAB;  Service: Cardiovascular;  Laterality: N/A;  . Venogram N/A 01/26/2013    Procedure: VENOGRAM;  Surgeon: Angelia Mould, MD;  Location: [facility] CATH LAB;  Service: Cardiovascular;  Laterality: N/A;    History   Social History  . Marital Status: Married    Spouse Name: N/A  . Number of Children:  N/A  . Years of Education: N/A   Occupational History  . disabled   . formerly Lawyer for [employer].    Social History Main Topics  . Smoking status: Former Smoker -- 1.00 packs/day for 25 years    Types: Cigarettes  . Smokeless tobacco: Former Systems developer    Quit date: 10/01/2005     Comment: Quit smoking 2007 Smoked x 25 years 1/2 ppd.  . Alcohol Use: 0.0 oz/week    0 Standard drinks or equivalent per week     Comment: occassional   . Drug Use: No  . Sexual Activity: Not on file   Other Topics Concern  . Not on file   Social History Narrative    Current Outpatient Prescriptions on File Prior to Visit  Medication Sig Dispense Refill  . allopurinol (ZYLOPRIM) 100 MG tablet Take 1 tablet (100 mg total) by mouth daily. 30 tablet 11  . amitriptyline (ELAVIL) 50 MG tablet Take 50 mg by mouth daily as needed (rest).     [name] aspirin 81 MG EC tablet Take 81 mg by mouth daily.     . calcium-vitamin D (OSCAL WITH D) 500-200 MG-UNIT per tablet Take 3 tablets by mouth 3 (three) times daily with meals.     . carvedilol (COREG) 25 MG tablet Take 0.5 tablets (12.5 mg total) by mouth 2 (two) times daily with a meal. Only on non-HD days    . cinacalcet (SENSIPAR) 60 MG tablet Take 60 mg by mouth 2 (two) times daily.     . colesevelam (WELCHOL) 625 MG tablet Take 3 tablets (1,875 mg total) by mouth 2 (two) times daily with a meal. 180 tablet 11  . ezetimibe (ZETIA) 10 MG tablet Take 1 tablet (10 mg total) by mouth daily. 30 tablet 6  . gabapentin (NEURONTIN) 300 MG capsule TAKE ONE CAPSULE BY MOUTH AT BEDTIME AS NEEDED FOR PAIN 90 capsule 0  . glucose blood (FREESTYLE TEST STRIPS) test strip 1 each by Other route daily. And lancets 1/day E11.9 100 each 12  . lanthanum (FOSRENOL) 1000 MG chewable tablet Chew 1,000 mg by mouth 2 times daily at 12 noon and 4 pm.    . multivitamin (RENA-VIT) TABS tablet Take 1 tablet by mouth daily.      . ranitidine (ZANTAC) 300 MG tablet Take 300 mg by mouth at  bedtime.     No current facility-administered medications on file prior to visit.    Allergies  Allergen Reactions  . Cephalexin Other (See Comments)    Tongue swelling  . Statins     Weak muscles    Family History  Problem Relation Age of Onset  . Heart disease Father   . Heart disease Sister     BP 152/80 mmHg  Pulse 87  Temp(Src) 98.8 F (37.1 C) (Oral)  Wt 240 lb (108.863 kg)  SpO2 96%    Review of Systems Denies weight change and sob    Objective:   Physical Exam  VITAL SIGNS:  See vs page GENERAL: no distress Pulses: dorsalis pedis intact bilat.   MSK: no deformity of the feet CV: no leg edema Skin:  no ulcer on the feet.  normal color and temp on the feet. Neuro: sensation is intact to touch on the feet, but decreased from normal. Ext: There is bilateral onychomycosis of the toenails.    Lab Results  Component Value Date   HGBA1C 7.5* 03/11/2015      Assessment & Plan:  HTN: therapy limited by noncompliance.  i'll do the best i can. DM: slightly worse.   Patient is advised the following: Patient Instructions  blood tests are requested for you today.  We'll let you know about the results. Please come back for a follow-up appointment in 6 months.   Please continue to take the carvedilol on non-dialysis days.  addendum: Please continue the same medication.  i'll let you know when i get fructosamine result.

## 2015-03-11 NOTE — Patient Instructions (Signed)
blood tests are requested for you today.  We'll let you know about the results. Please come back for a follow-up appointment in 6 months.   Please continue to take the carvedilol on non-dialysis days.

## 2015-03-12 DIAGNOSIS — E119 Type 2 diabetes mellitus without complications: Secondary | ICD-10-CM | POA: Diagnosis not present

## 2015-03-12 DIAGNOSIS — N186 End stage renal disease: Secondary | ICD-10-CM | POA: Diagnosis not present

## 2015-03-14 ENCOUNTER — Other Ambulatory Visit: Payer: Self-pay | Admitting: Endocrinology

## 2015-03-14 DIAGNOSIS — G5601 Carpal tunnel syndrome, right upper limb: Secondary | ICD-10-CM | POA: Diagnosis not present

## 2015-03-14 DIAGNOSIS — Z4789 Encounter for other orthopedic aftercare: Secondary | ICD-10-CM | POA: Diagnosis not present

## 2015-03-14 LAB — FRUCTOSAMINE: FRUCTOSAMINE: 351 umol/L — AB (ref 190–270)

## 2015-03-14 MED ORDER — NATEGLINIDE 60 MG PO TABS: 60.0000 mg | ORAL_TABLET | Freq: Three times a day (TID) | ORAL | Status: DC

## 2015-03-15 DIAGNOSIS — N186 End stage renal disease: Secondary | ICD-10-CM | POA: Diagnosis not present

## 2015-03-15 DIAGNOSIS — E119 Type 2 diabetes mellitus without complications: Secondary | ICD-10-CM | POA: Diagnosis not present

## 2015-03-17 DIAGNOSIS — N186 End stage renal disease: Secondary | ICD-10-CM | POA: Diagnosis not present

## 2015-03-17 DIAGNOSIS — E119 Type 2 diabetes mellitus without complications: Secondary | ICD-10-CM | POA: Diagnosis not present

## 2015-03-19 DIAGNOSIS — E119 Type 2 diabetes mellitus without complications: Secondary | ICD-10-CM | POA: Diagnosis not present

## 2015-03-19 DIAGNOSIS — N186 End stage renal disease: Secondary | ICD-10-CM | POA: Diagnosis not present

## 2015-03-22 DIAGNOSIS — E119 Type 2 diabetes mellitus without complications: Secondary | ICD-10-CM | POA: Diagnosis not present

## 2015-03-22 DIAGNOSIS — N186 End stage renal disease: Secondary | ICD-10-CM | POA: Diagnosis not present

## 2015-03-24 DIAGNOSIS — N186 End stage renal disease: Secondary | ICD-10-CM | POA: Diagnosis not present

## 2015-03-24 DIAGNOSIS — E119 Type 2 diabetes mellitus without complications: Secondary | ICD-10-CM | POA: Diagnosis not present

## 2015-03-25 DIAGNOSIS — G5601 Carpal tunnel syndrome, right upper limb: Secondary | ICD-10-CM | POA: Diagnosis not present

## 2015-03-25 DIAGNOSIS — Z4789 Encounter for other orthopedic aftercare: Secondary | ICD-10-CM | POA: Diagnosis not present

## 2015-03-26 DIAGNOSIS — N186 End stage renal disease: Secondary | ICD-10-CM | POA: Diagnosis not present

## 2015-03-26 DIAGNOSIS — E119 Type 2 diabetes mellitus without complications: Secondary | ICD-10-CM | POA: Diagnosis not present

## 2015-03-29 DIAGNOSIS — E119 Type 2 diabetes mellitus without complications: Secondary | ICD-10-CM | POA: Diagnosis not present

## 2015-03-29 DIAGNOSIS — N186 End stage renal disease: Secondary | ICD-10-CM | POA: Diagnosis not present

## 2015-03-31 DIAGNOSIS — E119 Type 2 diabetes mellitus without complications: Secondary | ICD-10-CM | POA: Diagnosis not present

## 2015-03-31 DIAGNOSIS — Z992 Dependence on renal dialysis: Secondary | ICD-10-CM | POA: Diagnosis not present

## 2015-03-31 DIAGNOSIS — E1129 Type 2 diabetes mellitus with other diabetic kidney complication: Secondary | ICD-10-CM | POA: Diagnosis not present

## 2015-03-31 DIAGNOSIS — N186 End stage renal disease: Secondary | ICD-10-CM | POA: Diagnosis not present

## 2015-04-02 DIAGNOSIS — N186 End stage renal disease: Secondary | ICD-10-CM | POA: Diagnosis not present

## 2015-04-02 DIAGNOSIS — N2581 Secondary hyperparathyroidism of renal origin: Secondary | ICD-10-CM | POA: Diagnosis not present

## 2015-04-02 DIAGNOSIS — D631 Anemia in chronic kidney disease: Secondary | ICD-10-CM | POA: Diagnosis not present

## 2015-04-02 DIAGNOSIS — E119 Type 2 diabetes mellitus without complications: Secondary | ICD-10-CM | POA: Diagnosis not present

## 2015-04-05 ENCOUNTER — Encounter (HOSPITAL_COMMUNITY): Payer: Self-pay | Admitting: Emergency Medicine

## 2015-04-05 ENCOUNTER — Emergency Department (HOSPITAL_COMMUNITY)
Admission: EM | Admit: 2015-04-05 | Discharge: 2015-04-05 | Disposition: A | Discharge status: Home / Self Care | Payer: Medicare Other | Attending: Emergency Medicine | Admitting: Emergency Medicine

## 2015-04-05 DIAGNOSIS — N2581 Secondary hyperparathyroidism of renal origin: Secondary | ICD-10-CM | POA: Diagnosis not present

## 2015-04-05 DIAGNOSIS — F329 Major depressive disorder, single episode, unspecified: Secondary | ICD-10-CM | POA: Insufficient documentation

## 2015-04-05 DIAGNOSIS — I959 Hypotension, unspecified: Secondary | ICD-10-CM | POA: Diagnosis not present

## 2015-04-05 DIAGNOSIS — K219 Gastro-esophageal reflux disease without esophagitis: Secondary | ICD-10-CM | POA: Insufficient documentation

## 2015-04-05 DIAGNOSIS — R1310 Dysphagia, unspecified: Secondary | ICD-10-CM | POA: Insufficient documentation

## 2015-04-05 DIAGNOSIS — Z9981 Dependence on supplemental oxygen: Secondary | ICD-10-CM | POA: Insufficient documentation

## 2015-04-05 DIAGNOSIS — Z87891 Personal history of nicotine dependence: Secondary | ICD-10-CM | POA: Diagnosis not present

## 2015-04-05 DIAGNOSIS — G4730 Sleep apnea, unspecified: Secondary | ICD-10-CM | POA: Diagnosis not present

## 2015-04-05 DIAGNOSIS — I2510 Atherosclerotic heart disease of native coronary artery without angina pectoris: Secondary | ICD-10-CM | POA: Diagnosis not present

## 2015-04-05 DIAGNOSIS — N186 End stage renal disease: Secondary | ICD-10-CM | POA: Diagnosis not present

## 2015-04-05 DIAGNOSIS — I120 Hypertensive chronic kidney disease with stage 5 chronic kidney disease or end stage renal disease: Secondary | ICD-10-CM | POA: Diagnosis not present

## 2015-04-05 DIAGNOSIS — Z79899 Other long term (current) drug therapy: Secondary | ICD-10-CM | POA: Diagnosis not present

## 2015-04-05 DIAGNOSIS — Z86010 Personal history of colonic polyps: Secondary | ICD-10-CM | POA: Diagnosis not present

## 2015-04-05 DIAGNOSIS — Z862 Personal history of diseases of the blood and blood-forming organs and certain disorders involving the immune mechanism: Secondary | ICD-10-CM | POA: Insufficient documentation

## 2015-04-05 DIAGNOSIS — M109 Gout, unspecified: Secondary | ICD-10-CM | POA: Insufficient documentation

## 2015-04-05 DIAGNOSIS — F0390 Unspecified dementia without behavioral disturbance: Secondary | ICD-10-CM | POA: Insufficient documentation

## 2015-04-05 DIAGNOSIS — D631 Anemia in chronic kidney disease: Secondary | ICD-10-CM | POA: Diagnosis not present

## 2015-04-05 DIAGNOSIS — I5032 Chronic diastolic (congestive) heart failure: Secondary | ICD-10-CM | POA: Diagnosis not present

## 2015-04-05 DIAGNOSIS — Z7982 Long term (current) use of aspirin: Secondary | ICD-10-CM | POA: Insufficient documentation

## 2015-04-05 DIAGNOSIS — E119 Type 2 diabetes mellitus without complications: Secondary | ICD-10-CM | POA: Diagnosis not present

## 2015-04-05 DIAGNOSIS — I509 Heart failure, unspecified: Secondary | ICD-10-CM | POA: Diagnosis not present

## 2015-04-05 LAB — BASIC METABOLIC PANEL
Anion gap: 9 (ref 5–15)
BUN: 24 mg/dL — AB (ref 6–20)
CO2: 32 mmol/L (ref 22–32)
Calcium: 9.1 mg/dL (ref 8.9–10.3)
Chloride: 98 mmol/L — ABNORMAL LOW (ref 101–111)
Creatinine, Ser: 7.86 mg/dL — ABNORMAL HIGH (ref 0.61–1.24)
GFR calc Af Amer: 7 mL/min — ABNORMAL LOW (ref >60)
GFR calc non Af Amer: 6 mL/min — ABNORMAL LOW (ref >60)
GLUCOSE: 161 mg/dL — AB (ref 65–99)
POTASSIUM: 4.1 mmol/L (ref 3.5–5.1)
SODIUM: 139 mmol/L (ref 135–145)

## 2015-04-05 LAB — CBC
HEMATOCRIT: 35.3 % — AB (ref 39.0–52.0)
Hemoglobin: 11.8 g/dL — ABNORMAL LOW (ref 13.0–17.0)
MCH: 30.3 pg (ref 26.0–34.0)
MCHC: 33.4 g/dL (ref 30.0–36.0)
MCV: 90.5 fL (ref 78.0–100.0)
Platelets: 217 10*3/uL (ref 150–400)
RBC: 3.9 MIL/uL — ABNORMAL LOW (ref 4.22–5.81)
RDW: 18.5 % — AB (ref 11.5–15.5)
WBC: 5.9 10*3/uL (ref 4.0–10.5)

## 2015-04-05 LAB — I-STAT CG4 LACTIC ACID, ED: LACTIC ACID, VENOUS: 1.03 mmol/L (ref 0.5–2.0)

## 2015-04-05 NOTE — Discharge Instructions (Signed)
It was our pleasure to provide your ER care today - we hope that you feel better.  We discussed your case with the GI doctor on call, Dr Amedeo Plenty - he indicates for you to follow up for a barium swallow, and then to follow up with them in the office in the next few days.  We made a referral for the barium swallow.  Call your GI doctors office tomorrow morning, and have them help coordinate the timing of the barium swallow, and your office appointment with them.  Return to ER if worse, food/liquids becomes persistently stuck and wont go down, unable to swallow, trouble breathing, other concern.      Dysphagia Swallowing problems (dysphagia) occur when solids and liquids seem to stick in your throat on the way down to your stomach, or the food takes longer to get to the stomach. Other symptoms include regurgitating food, noises coming from the throat, chest discomfort with swallowing, and a feeling of fullness or the feeling of something being stuck in your throat when swallowing. When blockage in your throat is complete, it may be associated with drooling. CAUSES  Problems with swallowing may occur because of problems with the muscles. The food cannot be propelled in the usual manner into your stomach. You may have ulcers, scar tissue, or inflammation in the tube down which food travels from your mouth to your stomach (esophagus), which blocks food from passing normally into the stomach. Causes of inflammation include:  Acid reflux from your stomach into your esophagus.  Infection.  Radiation treatment for cancer.  Medicines taken without enough fluids to wash them down into your stomach. You may have nerve problems that prevent signals from being sent to the muscles of your esophagus to contract and move your food down to your stomach. Globus pharyngeus is a relatively common problem in which there is a sense of an obstruction or difficulty in swallowing, without any physical abnormalities of the  swallowing passages being found. This problem usually improves over time with reassurance and testing to rule out other causes. DIAGNOSIS Dysphagia can be diagnosed and its cause can be determined by tests in which you swallow a white substance that helps illuminate the inside of your throat (contrast medium) while X-rays are taken. Sometimes a flexible telescope that is inserted down your throat (endoscopy) to look at your esophagus and stomach is used. TREATMENT   If the dysphagia is caused by acid reflux or infection, medicines may be used.  If the dysphagia is caused by problems with your swallowing muscles, swallowing therapy may be used to help you strengthen your swallowing muscles.  If the dysphagia is caused by a blockage or mass, procedures to remove the blockage may be done. HOME CARE INSTRUCTIONS  Try to eat soft food that is easier to swallow and check your weight on a daily basis to be sure that it is not decreasing.  Be sure to drink liquids when sitting upright (not lying down). SEEK MEDICAL CARE IF:  You are losing weight because you are unable to swallow.  You are coughing when you drink liquids (aspiration).  You are coughing up partially digested food. SEEK IMMEDIATE MEDICAL CARE IF:  You are unable to swallow your own saliva .  You are having shortness of breath or a fever, or both.  You have a hoarse voice along with difficulty swallowing. MAKE SURE YOU:  Understand these instructions.  Will watch your condition.  Will get help right away if you  are not doing well or get worse. Document Released: 09/14/2000 Document Revised: 02/01/2014 Document Reviewed: 03/06/2013 [facility] Patient Information 2015 [place], [place]. This information is not intended to replace advice given to you by your health care provider. Make sure you discuss any questions you have with your health care provider.   Modified Barium Swallow A modified barium swallow is a test to  evaluate swallowing and swallowing problems that can occur during eating and drinking. During this test, X-rays are used to directly view the parts of your body involved with normal chewing and swallowing. If food or liquid enters the wind pipe (aspirate) while eating or drinking, it will show up on the X-ray. The goal is to help the patient change specific chewing and swallowing behaviors. These changes can improve the safety and efficiency of eating and swallowing.  LET YOUR CAREGIVER KNOW ABOUT:  Allergies, especially to contrast materials.  Allergic reactions after eating chocolate, certain berries, or citrus fruit.  Any obstruction in the gastrointestinal tract.  Medications taken including herbs, eye drops, over-the-counter medications and creams.  Use of steroids (by mouth or creams).  Possible pregnancy, if applicable.  History of blood clots.  History of bleeding or blood problems.  Previous surgery.  Other health problems. RISKS AND COMPLICATIONS   Allergic reaction. This can be caused by the flavoring added to some brands of barium.  Blockage in the digestive system. This is due to a rare chance that some barium could stay in your system.  Cancer from radiation exposure (rare). The benefit of an accurate diagnosis far outweighs the risk. BEFORE THE PROCEDURE   Your caregiver will provide specific instructions about taking or not taking regular prescription medicines on the day of the test.  You may be asked to remove some or all of your clothes.  You may be asked to wear a gown during the exam.  You may also be asked to remove jewelry, eye glasses, and any metal objects or clothing that might interfere with X-ray images. PROCEDURE During the procedure, you will eat and swallow foods that contain a white chalky liquid (barium). Barium shows up during X-ray and outlines the structures within the body it encounters.   In the X-ray room, you sit upright in a chair or  you might stand.  You are given different foods and liquids to chew and swallow that is covered with or contains barium.  You may be asked to turn your head, sit back, hold your breath, cough, or take small bites during the test. AFTER THE PROCEDURE  The barium may color your stools gray or white for 48 to 72 hours after the procedure. Sometimes the barium can cause temporary constipation.  MAKE SURE YOU:   Understand these instructions.  Will watch your condition.  Will get help right away if you are not doing well or get worse. Document Released: 02/04/2007 Document Revised: 12/10/2011 Document Reviewed: 03/31/2007 [facility] Patient Information 2015 [place], [place]. This information is not intended to replace advice given to you by your health care provider. Make sure you discuss any questions you have with your health care provider.

## 2015-04-05 NOTE — ED Provider Notes (Addendum)
CSN: [id number]     Arrival date & time 04/05/15  1654 History   First MD Initiated Contact with Patient 04/05/15 1814     Chief Complaint  Patient presents with  . Dysphagia     (Consider location/radiation/quality/duration/timing/severity/associated sxs/prior Treatment) The history is provided by the patient and a relative.  Patient w hx ESRD on HD Tuesd/Thurs/Sat, had normal HD today, presents indicating intermittent trouble swallowing for several months. Pt indicates occurs w both solids and liquids, and is intermittent, not with every meal. No recent wt change. No vomiting. No heartburn.  Pt indicates he is chewing and able to move food/liquids through mouth without any trouble. No coughing, choking or gagging. States in region of mid to distal esophagus it appears to get hung up. States after 4-5 minutes he feels like it will pass. Notes remote prior endoscopy. Pt indicates worse in the past 3-4 days whereby has not had a reasonable meal, only small amts. Denies current fb sensation. No vomiting or drooling. Family states they called gi md on call and were advised to come to ED.  No fever or chills. No chest pain. No sob. Denies neurologic symptoms. No numbness/weakness. No change in vision or speech. No change in normal functional ability.      Past Medical History  Diagnosis Date  . Hyperthyroidism   . GASTROENTERITIS, VIRAL 10/14/2009  . ONYCHOMYCOSIS, TOENAILS 12/26/2007  . GOITER, MULTINODULAR 12/26/2007  . HYPERTHYROIDISM 02/02/2010  . DIABETES MELLITUS, TYPE II 02/01/2010  . DYSLIPIDEMIA 06/18/2007  . GOUT 06/18/2007  . Hypocalcemia 06/07/2010  . DEMENTIA 09/02/2009  . DEPRESSION 10/14/2009  . PERIPHERAL NEUROPATHY 06/18/2007  . HYPERTENSION 06/18/2007  . CONGESTIVE HEART FAILURE 06/18/2007  . DIASTOLIC HEART FAILURE, CHRONIC 02/06/2009  . Unspecified hypotension 01/30/2010  . PULMONARY NODULE, RIGHT LOWER LOBE 06/08/2009  . GERD 06/18/2007  . ESRD 08/04/2010  . BENIGN PROSTATIC HYPERTROPHY  10/14/2009  . GYNECOMASTIA 07/17/2010  . NECK PAIN 07/31/2010  . FOOT PAIN 08/12/2008  . DIZZINESS 07/17/2010  . Other malaise and fatigue 11/24/2009  . GAIT DISTURBANCE 03/03/2010  . DYSPNEA 10/29/2008  . CHEST PAIN 03/29/2010  . TRANSAMINASES, SERUM, ELEVATED 02/01/2010  . COLONIC POLYPS, HX OF 10/14/2009  . Anemia 06/16/2011  . Ischemic cardiomyopathy 06/16/2011  . Hyperparathyroidism, secondary 06/16/2011  . OSA on CPAP 10/16/2011  . Hyperlipidemia 10/16/2011  . CAD, NATIVE VESSEL 02/06/2009    saw Dr. Missy Sabins last jan 2013  . Sleep apnea     cpap machine and o2  . Allergic rhinitis, cause unspecified 02/24/2014   Past Surgical History  Procedure Laterality Date  . Stent 06/11/08    . Neck sugury  2/09  . Back surgury  1998  . Esophagogastroduodenoscopy  09/28/2011    Procedure: ESOPHAGOGASTRODUODENOSCOPY (EGD);  Surgeon: Missy Sabins, MD;  Location: [facility] ENDOSCOPY;  Service: Endoscopy;  Laterality: N/A;  . Givens capsule study  09/30/2011    Procedure: GIVENS CAPSULE STUDY;  Surgeon: Jeryl Columbia, MD;  Location: [facility] ENDOSCOPY;  Service: Endoscopy;  Laterality: N/A;  . Cholecystectomy    . Cardiac catheterization    . Tonsillectomy    . Av fistula placement  11/07/2011    Procedure: INSERTION OF ARTERIOVENOUS (AV) GORE-TEX GRAFT ARM;  Surgeon: Tinnie Gens, MD;  Location: [place];  Service: Vascular;  Laterality: Left;  . Dialysis fistula creation  2009    right  . Insertion of dialysis catheter  2014    right side  . Insertion of dialysis catheter Left  02/11/2013    Procedure: INSERTION OF DIALYSIS CATHETER;  Surgeon: Conrad Muskingum, MD;  Location: [place];  Service: Vascular;  Laterality: Left;  Ultrasound guided  . Removal of a dialysis catheter Right 02/11/2013    Procedure: REMOVAL OF A DIALYSIS CATHETER;  Surgeon: Conrad Standard, MD;  Location: [place];  Service: Vascular;  Laterality: Right;  . Bascilic vein transposition Right 02/27/2013    Procedure: BASCILIC VEIN TRANSPOSITION;  Surgeon:  Mal Misty, MD;  Location: [place];  Service: Vascular;  Laterality: Right;  Right Basilic Vein Transposition   . Shuntogram N/A 09/20/2011    Procedure: Earney Mallet;  Surgeon: Conrad Weston, MD;  Location: [facility] CATH LAB;  Service: Cardiovascular;  Laterality: N/A;  . Venogram N/A 01/26/2013    Procedure: VENOGRAM;  Surgeon: Angelia Mould, MD;  Location: [facility] CATH LAB;  Service: Cardiovascular;  Laterality: N/A;   Family History  Problem Relation Age of Onset  . Heart disease Father   . Heart disease Sister    History  Substance Use Topics  . Smoking status: Former Smoker -- 1.00 packs/day for 25 years    Types: Cigarettes  . Smokeless tobacco: Former Systems developer    Quit date: 10/01/2005     Comment: Quit smoking 2007 Smoked x 25 years 1/2 ppd.  . Alcohol Use: 0.0 oz/week    0 Standard drinks or equivalent per week     Comment: occassional     Review of Systems  Constitutional: Negative for fever and chills.  HENT: Negative for sore throat.   Eyes: Negative for visual disturbance.  Respiratory: Negative for cough and shortness of breath.   Cardiovascular: Negative for chest pain.  Gastrointestinal: Negative for nausea, vomiting and abdominal pain.  Genitourinary: Negative for flank pain.  Musculoskeletal: Negative for back pain and neck pain.  Skin: Negative for rash.  Neurological: Negative for speech difficulty, weakness, numbness and headaches.  Hematological: Does not bruise/bleed easily.  Psychiatric/Behavioral: Negative for confusion.      Allergies  Cephalexin and Statins  Home Medications   Prior to Admission medications   Medication Sig Start Date End Date Taking? Authorizing Provider  allopurinol (ZYLOPRIM) 100 MG tablet Take 1 tablet (100 mg total) by mouth daily. 01/13/14  Yes Biagio Borg, MD  amitriptyline (ELAVIL) 50 MG tablet Take 50 mg by mouth daily as needed (rest).  06/14/11  Yes Historical Provider, MD  aspirin 81 MG EC tablet Take 81 mg by mouth daily.     Yes Historical Provider, MD  calcium-vitamin D (OSCAL WITH D) 500-200 MG-UNIT per tablet Take 3 tablets by mouth 3 (three) times daily with meals.    Yes Historical Provider, MD  carvedilol (COREG) 25 MG tablet Take 0.5 tablets (12.5 mg total) by mouth 2 (two) times daily with a meal. Only on non-HD days 03/10/14  Yes Jolaine Artist, MD  cinacalcet (SENSIPAR) 60 MG tablet Take 60 mg by mouth 2 (two) times daily.    Yes Historical Provider, MD  colesevelam [facility]) 625 MG tablet Take 3 tablets (1,875 mg total) by mouth 2 (two) times daily with a meal. 01/13/14  Yes Biagio Borg, MD  ezetimibe (ZETIA) 10 MG tablet Take 1 tablet (10 mg total) by mouth daily. 12/29/14  Yes Jolaine Artist, MD  gabapentin (NEURONTIN) 300 MG capsule TAKE ONE CAPSULE BY MOUTH AT BEDTIME AS NEEDED FOR PAIN 01/10/15  Yes Biagio Borg, MD  lanthanum (FOSRENOL) 1000 MG chewable tablet Chew 1,000 mg  by mouth 2 times daily at 12 noon and 4 pm.   Yes Historical Provider, MD  multivitamin (RENA-VIT) TABS tablet Take 1 tablet by mouth daily.     Yes Historical Provider, MD  nateglinide (STARLIX) 60 MG tablet Take 1 tablet (60 mg total) by mouth 3 (three) times daily with meals. 03/14/15  Yes Renato Shin, MD  ranitidine (ZANTAC) 300 MG tablet Take 300 mg by mouth at bedtime.   Yes Historical Provider, MD   BP 137/78 mmHg  Pulse 82  Temp(Src) 98.3 F (36.8 C) (Oral)  Resp 16  Ht 6\' 1"  (1.854 m)  Wt 240 lb (108.863 kg)  BMI 31.67 kg/m2  SpO2 97% Physical Exam  Constitutional: He is oriented to person, place, and time. He appears well-developed and well-nourished. No distress.  HENT:  Head: Atraumatic.  Mouth/Throat: Oropharynx is clear and moist.  Eyes: Conjunctivae are normal. No scleral icterus.  Neck: Neck supple. No tracheal deviation present.  No masses  Cardiovascular: Normal rate, regular rhythm, normal heart sounds and intact distal pulses.   Pulmonary/Chest: Effort normal and breath sounds normal. No  accessory muscle usage. No respiratory distress.  Abdominal: Soft. Bowel sounds are normal. He exhibits no distension and no mass. There is no tenderness. There is no rebound and no guarding.  Musculoskeletal: Normal range of motion. He exhibits no edema or tenderness.  Neurological: He is alert and oriented to person, place, and time.  Skin: Skin is warm and dry. He is not diaphoretic.  Psychiatric: He has a normal mood and affect.  Nursing note and vitals reviewed.   ED Course  Procedures (including critical care time)   Results for orders placed or performed during the hospital encounter of 04/05/15  CBC  Result Value Ref Range   WBC 5.9 4.0 - 10.5 K/uL   RBC 3.90 (L) 4.22 - 5.81 MIL/uL   Hemoglobin 11.8 (L) 13.0 - 17.0 g/dL   HCT 35.3 (L) 39.0 - 52.0 %   MCV 90.5 78.0 - 100.0 fL   MCH 30.3 26.0 - 34.0 pg   MCHC 33.4 30.0 - 36.0 g/dL   RDW 18.5 (H) 11.5 - 15.5 %   Platelets 217 150 - 400 K/uL  Basic metabolic panel  Result Value Ref Range   Sodium 139 135 - 145 mmol/L   Potassium 4.1 3.5 - 5.1 mmol/L   Chloride 98 (L) 101 - 111 mmol/L   CO2 32 22 - 32 mmol/L   Glucose, Bld 161 (H) 65 - 99 mg/dL   BUN 24 (H) 6 - 20 mg/dL   Creatinine, Ser 7.86 (H) 0.61 - 1.24 mg/dL   Calcium 9.1 8.9 - 10.3 mg/dL   GFR calc non Af Amer 6 (L) >60 mL/min   GFR calc Af Amer 7 (L) >60 mL/min   Anion gap 9 5 - 15  I-Stat CG4 Lactic Acid, ED  Result Value Ref Range   Lactic Acid, Venous 1.03 0.5 - 2.0 mmol/L     MDM   Labs.  Pts family indicates they spoke with gi md earlier, Dr Paulita Fujita, who advised them to come to ED - gi paged.   Reviewed nursing notes and prior charts for additional history.   It appears patient had swallowing eval and barium esophagram 10-11/15 for similar symptoms - some possible esophageal dysmotility noted then.  Discussed pt with Dr Amedeo Plenty, on call for GI - he indicates if we can, get barium swallow today then d/c, if unable, states fine to d/c  w outpt f/u with  them.   Lytes normal. hgb c/w baseline.  Radiology indicates barium swallow would need to be arranged as outpt.  Pt is able to swallow water/liquids with no fb sensation, no choking or gagging.  Will request barium swallow as outpt, w close gi f/u per Dr Amedeo Plenty recommendations.  Explained plan to pt, pt/wife upset re miscommunication from office, feeling that definitive tx would be occuring today.  As a result, I again called Dr Amedeo Plenty back, and explained above, no meal in 4 days, etc, however tolerating po fluids here - he indicates instruct npo post mn, he will have office/Dr Outlaw call pt in AM, possibly endoscopy tomorrow.   Apologized to pt/wife re office/ed communication issues, and plan.      Lajean Saver, MD 04/05/15 2114

## 2015-04-05 NOTE — ED Notes (Signed)
Steinl, MD at bedside. 

## 2015-04-05 NOTE — ED Notes (Signed)
Pt given water by Dr. Ashok Cordia.

## 2015-04-05 NOTE — ED Notes (Addendum)
Pt reports trouble swallowing x 3 weeks but worse today. sts it feels like food gets stuck in throat and sometimes it later comes up. Denies pain. Hx of having esophagus stretched in the past.

## 2015-04-05 NOTE — ED Notes (Signed)
This RN unable to draw blood at this time. This RN to contact phlebotomy.

## 2015-04-05 NOTE — ED Notes (Signed)
Spoke with patient and family member at bedside. States does not want to be discharged and currently waiting for ED Doctor to speak with GI Doctor.

## 2015-04-05 NOTE — ED Notes (Signed)
Doctor spoke with patient and family member regarding discharge and follow up tomorrow.

## 2015-04-06 ENCOUNTER — Ambulatory Visit: Payer: Medicare Other | Admitting: Internal Medicine

## 2015-04-06 ENCOUNTER — Encounter (HOSPITAL_COMMUNITY): Payer: Self-pay | Admitting: *Deleted

## 2015-04-06 NOTE — Anesthesia Preprocedure Evaluation (Addendum)
Anesthesia Evaluation  Patient identified by MRN, date of birth, ID band Patient awake    Reviewed: Allergy & Precautions, H&P , NPO status , Patient's Chart, lab work & pertinent test results, reviewed documented beta blocker date and time   History of Anesthesia Complications Negative for: history of anesthetic complications  Airway Mallampati: I  TM Distance: >3 FB Neck ROM: Full    Dental  (+) Poor Dentition, Loose, Dental Advisory Given   Pulmonary shortness of breath and Long-Term Oxygen Therapy, sleep apnea, Continuous Positive Airway Pressure Ventilation and Oxygen sleep apnea , former smoker,  breath sounds clear to auscultation  Pulmonary exam normal       Cardiovascular hypertension, Pt. on medications and Pt. on home beta blockers + CAD, + Past MI, + Cardiac Stents and +CHF (Recent Echo 12/2014 with normal EF 55-60%, trivial MR, mild diastolic dysfunction) Normal cardiovascular examRhythm:Regular Rate:Normal  2/14 stress test: EF 33%, no ischemia   Neuro/Psych PSYCHIATRIC DISORDERS Depression    GI/Hepatic Neg liver ROS, GERD-  ,  Endo/Other  diabetes, Type 2Hyperthyroidism (Multinodular goiter)   Renal/GU ESRF and DialysisRenal disease (K+ 4.5)     Musculoskeletal   Abdominal (+) + obese,   Peds  Hematology   Anesthesia Other Findings   Reproductive/Obstetrics                          Anesthesia Physical  Anesthesia Plan  ASA: III  Anesthesia Plan: MAC   Post-op Pain Management:    Induction: Intravenous  Airway Management Planned: Natural Airway  Additional Equipment:   Intra-op Plan:   Post-operative Plan: Extubation in OR  Informed Consent: I have reviewed the patients History and Physical, chart, labs and discussed the procedure including the risks, benefits and alternatives for the proposed anesthesia with the patient or authorized representative who has indicated  his/her understanding and acceptance.   Dental advisory given  Plan Discussed with: CRNA, Anesthesiologist and Surgeon  Anesthesia Plan Comments: (- was recently cleared per the notes by his cardiologist to have right total knee replacement -Echo 4/16 normal EF - no longer on home oxygen, no issues, lungs clear)     Anesthesia Quick Evaluation

## 2015-04-06 NOTE — Progress Notes (Signed)
04-06-15 1450 -Dr. Jefm Petty made aware pt. Is scheduled to have hemodialysis following endoscopy procedure at [place].- [phone]. Requests BMP preprocedure prior to endoscopy and this is noted in special needs.

## 2015-04-07 ENCOUNTER — Ambulatory Visit (HOSPITAL_COMMUNITY)
Admission: RE | Admit: 2015-04-07 | Discharge: 2015-04-07 | Disposition: A | Discharge status: Home / Self Care | Payer: Medicare Other | Source: Ambulatory Visit | Attending: Gastroenterology | Admitting: Gastroenterology

## 2015-04-07 ENCOUNTER — Ambulatory Visit (HOSPITAL_COMMUNITY): Payer: Medicare Other | Admitting: Anesthesiology

## 2015-04-07 ENCOUNTER — Encounter (HOSPITAL_COMMUNITY): Payer: Self-pay | Admitting: Certified Registered"

## 2015-04-07 ENCOUNTER — Other Ambulatory Visit: Payer: Self-pay | Admitting: Gastroenterology

## 2015-04-07 ENCOUNTER — Encounter (HOSPITAL_COMMUNITY)
Admission: RE | Disposition: A | Discharge status: Home / Self Care | Payer: Self-pay | Source: Ambulatory Visit | Attending: Gastroenterology

## 2015-04-07 DIAGNOSIS — N2581 Secondary hyperparathyroidism of renal origin: Secondary | ICD-10-CM | POA: Diagnosis not present

## 2015-04-07 DIAGNOSIS — Z79899 Other long term (current) drug therapy: Secondary | ICD-10-CM | POA: Insufficient documentation

## 2015-04-07 DIAGNOSIS — R130 Aphagia: Secondary | ICD-10-CM | POA: Diagnosis not present

## 2015-04-07 DIAGNOSIS — Z9049 Acquired absence of other specified parts of digestive tract: Secondary | ICD-10-CM | POA: Diagnosis not present

## 2015-04-07 DIAGNOSIS — I120 Hypertensive chronic kidney disease with stage 5 chronic kidney disease or end stage renal disease: Secondary | ICD-10-CM | POA: Insufficient documentation

## 2015-04-07 DIAGNOSIS — G4733 Obstructive sleep apnea (adult) (pediatric): Secondary | ICD-10-CM | POA: Insufficient documentation

## 2015-04-07 DIAGNOSIS — G629 Polyneuropathy, unspecified: Secondary | ICD-10-CM | POA: Diagnosis not present

## 2015-04-07 DIAGNOSIS — K219 Gastro-esophageal reflux disease without esophagitis: Secondary | ICD-10-CM | POA: Diagnosis not present

## 2015-04-07 DIAGNOSIS — Z7982 Long term (current) use of aspirin: Secondary | ICD-10-CM | POA: Diagnosis not present

## 2015-04-07 DIAGNOSIS — Z9989 Dependence on other enabling machines and devices: Secondary | ICD-10-CM | POA: Insufficient documentation

## 2015-04-07 DIAGNOSIS — Z955 Presence of coronary angioplasty implant and graft: Secondary | ICD-10-CM | POA: Insufficient documentation

## 2015-04-07 DIAGNOSIS — E119 Type 2 diabetes mellitus without complications: Secondary | ICD-10-CM | POA: Insufficient documentation

## 2015-04-07 DIAGNOSIS — M109 Gout, unspecified: Secondary | ICD-10-CM | POA: Insufficient documentation

## 2015-04-07 DIAGNOSIS — Z9981 Dependence on supplemental oxygen: Secondary | ICD-10-CM | POA: Insufficient documentation

## 2015-04-07 DIAGNOSIS — R1310 Dysphagia, unspecified: Secondary | ICD-10-CM | POA: Insufficient documentation

## 2015-04-07 DIAGNOSIS — E785 Hyperlipidemia, unspecified: Secondary | ICD-10-CM | POA: Diagnosis not present

## 2015-04-07 DIAGNOSIS — I5032 Chronic diastolic (congestive) heart failure: Secondary | ICD-10-CM | POA: Insufficient documentation

## 2015-04-07 DIAGNOSIS — Z6831 Body mass index (BMI) 31.0-31.9, adult: Secondary | ICD-10-CM | POA: Diagnosis not present

## 2015-04-07 DIAGNOSIS — I252 Old myocardial infarction: Secondary | ICD-10-CM | POA: Diagnosis not present

## 2015-04-07 DIAGNOSIS — I2510 Atherosclerotic heart disease of native coronary artery without angina pectoris: Secondary | ICD-10-CM | POA: Insufficient documentation

## 2015-04-07 DIAGNOSIS — N400 Enlarged prostate without lower urinary tract symptoms: Secondary | ICD-10-CM | POA: Insufficient documentation

## 2015-04-07 DIAGNOSIS — K222 Esophageal obstruction: Secondary | ICD-10-CM | POA: Diagnosis not present

## 2015-04-07 DIAGNOSIS — E669 Obesity, unspecified: Secondary | ICD-10-CM | POA: Insufficient documentation

## 2015-04-07 DIAGNOSIS — F0390 Unspecified dementia without behavioral disturbance: Secondary | ICD-10-CM | POA: Diagnosis not present

## 2015-04-07 DIAGNOSIS — E0500 Thyrotoxicosis with diffuse goiter without thyrotoxic crisis or storm: Secondary | ICD-10-CM | POA: Diagnosis not present

## 2015-04-07 DIAGNOSIS — D631 Anemia in chronic kidney disease: Secondary | ICD-10-CM | POA: Diagnosis not present

## 2015-04-07 DIAGNOSIS — Z87891 Personal history of nicotine dependence: Secondary | ICD-10-CM | POA: Insufficient documentation

## 2015-04-07 DIAGNOSIS — F329 Major depressive disorder, single episode, unspecified: Secondary | ICD-10-CM | POA: Diagnosis not present

## 2015-04-07 DIAGNOSIS — N186 End stage renal disease: Secondary | ICD-10-CM | POA: Diagnosis not present

## 2015-04-07 HISTORY — DX: Dependence on renal dialysis: Z99.2

## 2015-04-07 HISTORY — PX: ESOPHAGOGASTRODUODENOSCOPY: SHX5428

## 2015-04-07 HISTORY — DX: Personal history of other medical treatment: Z92.89

## 2015-04-07 HISTORY — PX: SAVORY DILATION: SHX5439

## 2015-04-07 LAB — POCT I-STAT 4, (NA,K, GLUC, HGB,HCT)
Glucose, Bld: 117 mg/dL — ABNORMAL HIGH (ref 65–99)
HCT: 32 % — ABNORMAL LOW (ref 39.0–52.0)
Hemoglobin: 10.9 g/dL — ABNORMAL LOW (ref 13.0–17.0)
Potassium: 4.6 mmol/L (ref 3.5–5.1)
SODIUM: 139 mmol/L (ref 135–145)

## 2015-04-07 SURGERY — EGD (ESOPHAGOGASTRODUODENOSCOPY)
Anesthesia: Monitor Anesthesia Care

## 2015-04-07 MED ORDER — PROPOFOL 10 MG/ML IV BOLUS
INTRAVENOUS | Status: DC | PRN
  Administered 2015-04-07: 100 mg via INTRAVENOUS
  Administered 2015-04-07 (×2): 50 mg via INTRAVENOUS

## 2015-04-07 MED ORDER — SODIUM CHLORIDE 0.9 % IV SOLN: INTRAVENOUS | Status: DC

## 2015-04-07 MED ORDER — PROPOFOL 10 MG/ML IV BOLUS
INTRAVENOUS | Status: AC
  Filled 2015-04-07: qty 20

## 2015-04-07 MED ORDER — SODIUM CHLORIDE 0.9 % IV SOLN
INTRAVENOUS | Status: DC | PRN
  Administered 2015-04-07: 09:00:00 via INTRAVENOUS

## 2015-04-07 NOTE — Addendum Note (Signed)
Addended by: Teena Irani on: 04/07/2015 08:51 AM   Modules accepted: Orders

## 2015-04-07 NOTE — Transfer of Care (Signed)
Immediate Anesthesia Transfer of Care Note  Patient: [name]  Procedure(s) Performed: Procedure(s): ESOPHAGOGASTRODUODENOSCOPY (EGD) (N/A) SAVORY DILATION (N/A)  Patient Location: PACU  Anesthesia Type:MAC  Level of Consciousness:  sedated, patient cooperative and responds to stimulation  Airway & Oxygen Therapy:Patient Spontanous Breathing  Post-op Assessment:  Report given to PACU RN and Post -op Vital signs reviewed and stable  Post vital signs:  Reviewed and stable  Last Vitals:  Filed Vitals:   04/07/15 0816  BP: 155/82  Pulse: 80  Temp: 123456 C    Complications: No apparent anesthesia complications

## 2015-04-07 NOTE — H&P (Signed)
[facility] Admission History & Physical  Chief Complaint: Dysphagia HPI: [name] is an 64 y.o. black male. Who presents with several month history of intermittent solid food and some liquid dysphagia with some forced regurgitation. He has had an esophageal dilatation in the remote past and recently had a barium swallow that showed mainly esophageal dysmotility. He admits to an unspecified amount of weight loss. His symptoms have been progressively worsening.  Past Medical History  Diagnosis Date  . Hyperthyroidism   . GASTROENTERITIS, VIRAL 10/14/2009  . ONYCHOMYCOSIS, TOENAILS 12/26/2007  . GOITER, MULTINODULAR 12/26/2007  . HYPERTHYROIDISM 02/02/2010  . DIABETES MELLITUS, TYPE II 02/01/2010  . DYSLIPIDEMIA 06/18/2007  . GOUT 06/18/2007  . Hypocalcemia 06/07/2010  . DEMENTIA 09/02/2009  . DEPRESSION 10/14/2009  . PERIPHERAL NEUROPATHY 06/18/2007  . HYPERTENSION 06/18/2007  . CONGESTIVE HEART FAILURE 06/18/2007  . DIASTOLIC HEART FAILURE, CHRONIC 02/06/2009  . Unspecified hypotension 01/30/2010  . PULMONARY NODULE, RIGHT LOWER LOBE 06/08/2009  . GERD 06/18/2007  . ESRD 08/04/2010  . BENIGN PROSTATIC HYPERTROPHY 10/14/2009  . GYNECOMASTIA 07/17/2010  . NECK PAIN 07/31/2010  . FOOT PAIN 08/12/2008  . DIZZINESS 07/17/2010  . Other malaise and fatigue 11/24/2009  . GAIT DISTURBANCE 03/03/2010  . DYSPNEA 10/29/2008  . CHEST PAIN 03/29/2010  . TRANSAMINASES, SERUM, ELEVATED 02/01/2010  . COLONIC POLYPS, HX OF 10/14/2009  . Anemia 06/16/2011  . Ischemic cardiomyopathy 06/16/2011  . Hyperparathyroidism, secondary 06/16/2011  . OSA on CPAP 10/16/2011  . Hyperlipidemia 10/16/2011  . CAD, NATIVE VESSEL 02/06/2009    saw Dr. Missy Sabins last jan 2013  . Sleep apnea     cpap machine and o2  . Allergic rhinitis, cause unspecified 02/24/2014  . Transfusion history     none recent  . Hemodialysis access, fistula mature     Dialysis [facility] ([place]) Right upper arm fistula    Past  Surgical History  Procedure Laterality Date  . Stent 06/11/08    . Neck sugury  2/09  . Back surgury  1998  . Esophagogastroduodenoscopy  09/28/2011    Procedure: ESOPHAGOGASTRODUODENOSCOPY (EGD);  Surgeon: Missy Sabins, MD;  Location: [facility] ENDOSCOPY;  Service: Endoscopy;  Laterality: N/A;  . Givens capsule study  09/30/2011    Procedure: GIVENS CAPSULE STUDY;  Surgeon: Jeryl Columbia, MD;  Location: [facility] ENDOSCOPY;  Service: Endoscopy;  Laterality: N/A;  . Cholecystectomy    . Cardiac catheterization    . Tonsillectomy    . Av fistula placement  11/07/2011    Procedure: INSERTION OF ARTERIOVENOUS (AV) GORE-TEX GRAFT ARM;  Surgeon: Tinnie Gens, MD;  Location: [place];  Service: Vascular;  Laterality: Left;  . Dialysis fistula creation  2009    right  . Insertion of dialysis catheter  2014    right side  . Insertion of dialysis catheter Left 02/11/2013    Procedure: INSERTION OF DIALYSIS CATHETER;  Surgeon: Conrad [place] Park, MD;  Location: [place];  Service: Vascular;  Laterality: Left;  Ultrasound guided  . Removal of a dialysis catheter Right 02/11/2013    Procedure: REMOVAL OF A DIALYSIS CATHETER;  Surgeon: Conrad Tamora, MD;  Location: [place];  Service: Vascular;  Laterality: Right;  . Bascilic vein transposition Right 02/27/2013    Procedure: BASCILIC VEIN TRANSPOSITION;  Surgeon: Mal Misty, MD;  Location: [place];  Service: Vascular;  Laterality: Right;  Right Basilic Vein Transposition   . Shuntogram N/A 09/20/2011    Procedure: Earney Mallet;  Surgeon: Conrad Havana,  MD;  Location: [place] CATH LAB;  Service: Cardiovascular;  Laterality: N/A;  . Venogram N/A 01/26/2013    Procedure: VENOGRAM;  Surgeon: Angelia Mould, MD;  Location[facility] CATH LAB;  Service: Cardiovascular;  Laterality: N/A;    Medications Prior to Admission  Medication Sig Dispense Refill  . aspirin 81 MG EC tablet Take 81 mg by mouth daily.     . calcium-vitamin D (OSCAL WITH D) 500-200 MG-UNIT per tablet Take 3 tablets by mouth 3  (three) times daily with meals.     . carvedilol (COREG) 25 MG tablet Take 0.5 tablets (12.5 mg total) by mouth 2 (two) times daily with a meal. Only on non-HD days    . cinacalcet (SENSIPAR) 60 MG tablet Take 60 mg by mouth 2 (two) times daily.     . colesevelam (WELCHOL) 625 MG tablet Take 3 tablets (1,875 mg total) by mouth 2 (two) times daily with a meal. 180 tablet 11  . gabapentin (NEURONTIN) 300 MG capsule TAKE ONE CAPSULE BY MOUTH AT BEDTIME AS NEEDED FOR PAIN 90 capsule 0  . lanthanum (FOSRENOL) 1000 MG chewable tablet Chew 1,000 mg by mouth 2 times daily at 12 noon and 4 pm.    . multivitamin (RENA-VIT) TABS tablet Take 1 tablet by mouth daily.      . nateglinide (STARLIX) 60 MG tablet Take 1 tablet (60 mg total) by mouth 3 (three) times daily with meals. 90 tablet 11  . allopurinol (ZYLOPRIM) 100 MG tablet Take 1 tablet (100 mg total) by mouth daily. 30 tablet 11  . amitriptyline (ELAVIL) 50 MG tablet Take 50 mg by mouth daily as needed (rest).     [name] ezetimibe (ZETIA) 10 MG tablet Take 1 tablet (10 mg total) by mouth daily. 30 tablet 6  . ranitidine (ZANTAC) 300 MG tablet Take 300 mg by mouth at bedtime.      Allergies:  Allergies  Allergen Reactions  . Cephalexin Other (See Comments)    Tongue swelling  . Statins     Weak muscles    Family History  Problem Relation Age of Onset  . Heart disease Father   . Heart disease Sister     Social History:  reports that he has quit smoking. His smoking use included Cigarettes. He has a 25 pack-year smoking history. He quit smokeless tobacco use about 9 years ago. He reports that he drinks alcohol. He reports that he does not use illicit drugs.  Review of Systems: negative except as above   Blood pressure 155/82, pulse 80, temperature 98.5 F (36.9 C), temperature source Oral, height 6\' 1"  (1.854 m), weight 108.863 kg (240 lb). Head: Normocephalic, without obvious abnormality, atraumatic Neck: no adenopathy, no carotid bruit, no  JVD, supple, symmetrical, trachea midline and thyroid not enlarged, symmetric, no tenderness/mass/nodules Resp: clear to auscultation bilaterally Cardio: regular rate and rhythm, S1, S2 normal, no murmur, click, rub or gallop GI: Abdomen soft moderately distended with normoactive bowel sounds. No past or megaly masses or guarding Extremities: extremities normal, atraumatic, no cyanosis or edema  Results for orders placed or performed during the hospital encounter of 04/07/15 (from the past 48 hour(s))  I-STAT 4, (NA,K, GLUC, HGB,HCT)     Status: Abnormal   Collection Time: 04/07/15  8:55 AM  Result Value Ref Range   Sodium 139 135 - 145 mmol/L   Potassium 4.6 3.5 - 5.1 mmol/L   Glucose, Bld 117 (H) 65 - 99 mg/dL   HCT 32.0 (L) 39.0 -  52.0 %   Hemoglobin 10.9 (L) 13.0 - 17.0 g/dL   No results found.  Assessment: Dysphagia solid greater than liquid unclear whether due to esophageal motility disorder, stricture or malignancy Plan: Proceed with EGD with possible dilatation or biopsy.  [id number] C 04/07/2015, 9:24 AM

## 2015-04-07 NOTE — Op Note (Signed)
[name] [place], [address]   ENDOSCOPY PROCEDURE REPORT  PATIENT: [name], [name]  MR#: [id number] BIRTHDATE: [birth year] , 26  yrs. old GENDER: male ENDOSCOPIST: Teena Irani, MD REFERRED BY: PROCEDURE DATE:  04-14-15 PROCEDURE: ASA CLASS: INDICATIONS:  dysphagia MEDICATIONS: MAC TOPICAL ANESTHETIC:  DESCRIPTION OF PROCEDURE: After the risks benefits and alternatives of the procedure were thoroughly explained, informed consent was obtained.  The Pentax      endoscope was introduced through the mouth and advanced to the second portion of the duodenum , Without limitations.  The instrument was slowly withdrawn as the mucosa was fully examined. Estimated blood loss is zero unless otherwise noted in this procedure report.    esophagus somewhat dilated with possible subtle distal stricture somewhat friable and traversed with the scope. Stomach had some retained food. Pylorus somewhat fixed in appearance but scope passed easily into the duodenum otherwise stomach and duodenum were normal. A Savary guidewire was placed through the endoscope channel into the duodenum and a 16 mm Savary dilator was passed over the guidewire with moderate resistance.              The scope was then withdrawn from the patient and the procedure completed.  COMPLICATIONS: There were no immediate complications.  ENDOSCOPIC IMPRESSION: probable distal esophageal stricture, somewhat dilated esophagus consistent with esophageal dysmotility  RECOMMENDATIONS: advance diet and observe response to dilatation. Follow-up in the office with Dr. Paulita Fujita in 2-4 weeks.  REPEAT EXAM:  eSigned:  Teena Irani, MD April 14, 2015 10:01 AM    CC:  CPT CODES: ICD CODES:  The ICD and CPT codes recommended by this software are interpretations from the data that the clinical staff has captured with the software.  The verification of the translation of this report to the ICD and CPT  codes and modifiers is the sole responsibility of the health care institution and practicing physician where this report was generated.  [employer]. will not be held responsible for the validity of the ICD and CPT codes included on this report.  AMA assumes no liability for data contained or not contained herein. CPT is a Designer, television/film set of the [employer].

## 2015-04-07 NOTE — Discharge Instructions (Signed)
Esophageal Dilatation °The esophagus is the long, narrow tube which carries food and liquid from the mouth to the stomach. Esophageal dilatation is the technique used to stretch a blocked or narrowed portion of the esophagus. This procedure is used when a part of the esophagus has become so narrow that it becomes difficult, painful or even impossible to swallow. This is generally an uncomplicated form of treatment. When this is not successful, chest surgery may be required. This is a much more extensive form of treatment with a longer recovery time. °CAUSES  °Some of the more common causes of blockage or strictures of the esophagus are: °· Narrowing from longstanding inflammation (soreness and redness) of the lower esophagus. This comes from the constant exposure of the lower esophagus to the acid which bubbles up from the stomach. Over time this causes scarring and narrowing of the lower esophagus. °· Hiatal hernia in which a small part of the stomach bulges (herniates) up through the diaphragm. This can cause a gradual narrowing of the end of the esophagus. °· Schatzki ring is a narrow ring of benign (non-cancerous) fibrous tissue which constricts the lower esophagus. The reason for this is not known. °· Scleroderma is a connective tissue disorder that affects the esophagus and makes swallowing difficult. °· Achalasia is an absence of nerves to the lower esophagus and to the esophageal sphincter. This is the circular muscle between the stomach and esophagus that relaxes to allow food into the stomach. After swallowing, it contracts to keep food in the stomach. This absence of nerves may be congenital (present since birth). This can cause irregular spasms of the lower esophageal muscle. This spasm does not open up to allow food and fluid through. The result is a persistent blockage with subsequent slow trickling of the esophageal contents into the stomach. °· Strictures may develop from swallowing materials which  damage the esophagus. Some examples are strong acids or alkalis such as lye. °· Growths such as benign (non-cancerous) and malignant (cancerous) tumors can block the esophagus. °· Hereditary (present since birth) causes. °DIAGNOSIS  °Your caregiver often suspects this problem by taking a medical history. They will also do a physical exam. They can then prove their suspicions using X-rays and endoscopy. Endoscopy is an exam in which a tube like a small, flexible telescope is used to look at your esophagus.  °TREATMENT °There are different stretching (dilating) techniques that can be used. Simple bougie dilatation may be done in the office. This usually takes only a couple minutes. A numbing (anesthetic) spray of the throat is used. Endoscopy, when done, is done in an endoscopy suite under mild sedation. When fluoroscopy is used, the procedure is performed in X-ray. Other techniques require a little longer time. Recovery is usually quick. There is no waiting time to begin eating and drinking to test success of the treatment. Following are some of the methods used. °Narrowing of the esophagus is treated by making it bigger. °Commonly this is a mechanical problem which can be treated with stretching. This can be done in different ways. Your caregiver will discuss these with you. Some of the means used are: °· A series of graduated (increasing thickness) flexible dilators can be used. These are weighted tubes passed through the esophagus into the stomach. The tubes used become progressively larger until the desired stretched size is reached. Graduated dilators are a simple and quick way of opening the esophagus. No visualization is required. °· Another method is the use of endoscopy to place   a flexible wire across the stricture. The endoscope is removed and the wire left in place. A dilator with a hole through it from end to end is guided down the esophagus and across the stricture. One or more of these dilators are  passed over the wire. At the end of the exam, the wire is removed. This type of treatment may be performed in the X-ray department under fluoroscopy. An advantage of this procedure is the examiner is visualizing the end opening in the esophagus. °· Stretching of the esophagus may be done using balloons. Deflated balloons are placed through the endoscope and across the stricture. This type of balloon dilatation is often done at the time of endoscopy or fluoroscopy. Flexible endoscopy allows the examiner to directly view the stricture. A balloon is inserted in the deflated form into the area of narrowing. It is then inflated with air to a certain pressure that is preset for a given circumference. When inflated, it becomes sausage shaped, stretched, and makes the stricture larger. °· Achalasia requires a longer, larger balloon-type dilator. This is frequently done under X-ray control. In this situation, the spastic muscle fibers in the lower esophagus are stretched. °All of the above procedures make the passage of food and water into the stomach easier. They also make it easier for stomach contents to reflux back into the esophagus. Special medications may be used following the procedure to help prevent further stricturing. Proton-pump inhibitor medications are good at decreasing the amount of acid in the stomach juice. When stomach juice refluxes into the esophagus, the juice is no longer as acidic and is less likely to burn or scar the esophagus. °RISKS AND COMPLICATIONS °Esophageal dilatation is usually performed effectively and without problems. Some complications that can occur are: °· A small amount of bleeding almost always happens where the stretching takes place. If this is too excessive it may require more aggressive treatment. °· An uncommon complication is perforation (making a hole) of the esophagus. The esophagus is thin. It is easy to make a hole in it. If this happens, an operation may be necessary to  repair this. °· A small, undetected perforation could lead to an infection in the chest. This can be very serious. °HOME CARE INSTRUCTIONS  °· If you received sedation for your procedure, do not drive, make important decisions, or perform any activities requiring your full coordination. Do not drink alcohol, take sedatives, or use any mind altering chemicals unless instructed by your caregiver. °· You may use throat lozenges or warm salt water gargles if you have throat discomfort. °· You can begin eating and drinking normally on return home unless instructed otherwise. Do not purposely try to force large chunks of food down to test the benefits of your procedure. °· Mild discomfort can be eased with sips of ice water. °· Medications for discomfort may or may not be needed. °SEEK IMMEDIATE MEDICAL CARE IF:  °· You begin vomiting up blood. °· You develop black, tarry stools. °· You develop chills or an unexplained temperature of over 101°F (38.3°C) °· You develop chest or abdominal pain. °· You develop shortness of breath, or feel light-headed or faint. °· Your swallowing is becoming more painful, difficult, or you are unable to swallow. °MAKE SURE YOU:  °· Understand these instructions. °· Will watch your condition. °· Will get help right away if you are not doing well or get worse. °Document Released: 11/08/2005 Document Revised: 02/01/2014 Document Reviewed: 12/26/2005 °ExitCare® Patient Information ©2015 [employer].   This information is not intended to replace advice given to you by your health care provider. Make sure you discuss any questions you have with your health care provider. ° °

## 2015-04-07 NOTE — Anesthesia Postprocedure Evaluation (Signed)
  Anesthesia Post-op Note  Patient: [name]  Procedure(s) Performed: Procedure(s) (LRB): ESOPHAGOGASTRODUODENOSCOPY (EGD) (N/A) SAVORY DILATION (N/A)  Patient Location: PACU  Anesthesia Type: MAC  Level of Consciousness: awake and alert   Airway and Oxygen Therapy: Patient Spontanous Breathing  Post-op Pain: mild  Post-op Assessment: Post-op Vital signs reviewed, Patient's Cardiovascular Status Stable, Respiratory Function Stable, Patent Airway and No signs of Nausea or vomiting  Last Vitals:  Filed Vitals:   04/07/15 1020  BP: 165/78  Pulse: 67  Temp:   Resp: 12    Post-op Vital Signs: stable   Complications: No apparent anesthesia complications

## 2015-04-08 ENCOUNTER — Encounter (HOSPITAL_COMMUNITY): Payer: Self-pay | Admitting: Gastroenterology

## 2015-04-09 DIAGNOSIS — N2581 Secondary hyperparathyroidism of renal origin: Secondary | ICD-10-CM | POA: Diagnosis not present

## 2015-04-09 DIAGNOSIS — N186 End stage renal disease: Secondary | ICD-10-CM | POA: Diagnosis not present

## 2015-04-09 DIAGNOSIS — D631 Anemia in chronic kidney disease: Secondary | ICD-10-CM | POA: Diagnosis not present

## 2015-04-09 DIAGNOSIS — E119 Type 2 diabetes mellitus without complications: Secondary | ICD-10-CM | POA: Diagnosis not present

## 2015-04-12 DIAGNOSIS — N2581 Secondary hyperparathyroidism of renal origin: Secondary | ICD-10-CM | POA: Diagnosis not present

## 2015-04-12 DIAGNOSIS — D631 Anemia in chronic kidney disease: Secondary | ICD-10-CM | POA: Diagnosis not present

## 2015-04-12 DIAGNOSIS — N186 End stage renal disease: Secondary | ICD-10-CM | POA: Diagnosis not present

## 2015-04-12 DIAGNOSIS — E119 Type 2 diabetes mellitus without complications: Secondary | ICD-10-CM | POA: Diagnosis not present

## 2015-04-13 ENCOUNTER — Other Ambulatory Visit: Payer: Self-pay | Admitting: Internal Medicine

## 2015-04-13 DIAGNOSIS — L602 Onychogryphosis: Secondary | ICD-10-CM | POA: Diagnosis not present

## 2015-04-13 DIAGNOSIS — E1151 Type 2 diabetes mellitus with diabetic peripheral angiopathy without gangrene: Secondary | ICD-10-CM | POA: Diagnosis not present

## 2015-04-13 DIAGNOSIS — L84 Corns and callosities: Secondary | ICD-10-CM | POA: Diagnosis not present

## 2015-04-14 ENCOUNTER — Telehealth: Payer: Self-pay | Admitting: Internal Medicine

## 2015-04-14 DIAGNOSIS — E119 Type 2 diabetes mellitus without complications: Secondary | ICD-10-CM | POA: Diagnosis not present

## 2015-04-14 DIAGNOSIS — D631 Anemia in chronic kidney disease: Secondary | ICD-10-CM | POA: Diagnosis not present

## 2015-04-14 DIAGNOSIS — N186 End stage renal disease: Secondary | ICD-10-CM | POA: Diagnosis not present

## 2015-04-14 DIAGNOSIS — N2581 Secondary hyperparathyroidism of renal origin: Secondary | ICD-10-CM | POA: Diagnosis not present

## 2015-04-14 NOTE — Telephone Encounter (Signed)
[facility] Patient Name: [name] DOB: [birth year] Initial Comment Caller states that her husband has light headedness, blood pressure is 80/46, along with dizzyness - is a [facility]ial[facility] patient Nurse Assessment Nurse: Martyn Ehrich, RN, Felicia Date/Time (Eastern Time): 04/14/2015 3:50:26 PM Confirm and document reason for call. If symptomatic, describe symptoms. ---PT has 110/56 blood pressure. It was 90/40 earlier and he was lightheaded 1 hour ago. Not lightheaded now. A little weak. No fever. Endoscopy to stretch esophagus one week ago. Has the patient traveled out of the country within the last 30 days? ---No Does the patient require triage? ---Yes Related visit to physician within the last 2 weeks? ---Yes Does the PT have any chronic conditions? (i.e. diabetes, asthma, etc.) ---Yes List chronic conditions. ---DM and dialysisGuidelines Guideline Title Affirmed Question Affirmed Notes Low Blood Pressure [id number] Systolic BP [id number] AND [id number] taking blood pressure medications AND [3] NOT dizzy, lightheaded or weak Final Disposition User See Physician within 24 Hours [place], RN, [place] Comments triaged for post op - (post procedure). all answers were no the lowest it got was 80/40 about 2:30 pm - it ws 110/56 when we talked. Pt had dialysis today. PT REFUSES AN APPT FOR TOMORROW. REQUESTS A CALL BACK. Told caller someone will call them from office (wife says this happens a lot after dialysis on dialysis days) Referrals GO TO FACILITY REFUSED Disagree/Comply: Disagree Disagree/Comply Reason: Disagree with instructions Call Id: [phone]

## 2015-04-16 DIAGNOSIS — N186 End stage renal disease: Secondary | ICD-10-CM | POA: Diagnosis not present

## 2015-04-16 DIAGNOSIS — E119 Type 2 diabetes mellitus without complications: Secondary | ICD-10-CM | POA: Diagnosis not present

## 2015-04-16 DIAGNOSIS — N2581 Secondary hyperparathyroidism of renal origin: Secondary | ICD-10-CM | POA: Diagnosis not present

## 2015-04-16 DIAGNOSIS — D631 Anemia in chronic kidney disease: Secondary | ICD-10-CM | POA: Diagnosis not present

## 2015-04-18 ENCOUNTER — Inpatient Hospital Stay (HOSPITAL_COMMUNITY)
Admission: EM | Admit: 2015-04-18 | Discharge: 2015-04-21 | DRG: 811 | Disposition: A | Discharge status: Home / Self Care | Payer: Medicare Other | Attending: Internal Medicine | Admitting: Internal Medicine

## 2015-04-18 ENCOUNTER — Encounter (HOSPITAL_COMMUNITY): Payer: Self-pay | Admitting: Emergency Medicine

## 2015-04-18 DIAGNOSIS — Z6831 Body mass index (BMI) 31.0-31.9, adult: Secondary | ICD-10-CM

## 2015-04-18 DIAGNOSIS — I5032 Chronic diastolic (congestive) heart failure: Secondary | ICD-10-CM | POA: Diagnosis not present

## 2015-04-18 DIAGNOSIS — G4733 Obstructive sleep apnea (adult) (pediatric): Secondary | ICD-10-CM | POA: Diagnosis present

## 2015-04-18 DIAGNOSIS — N186 End stage renal disease: Secondary | ICD-10-CM | POA: Diagnosis present

## 2015-04-18 DIAGNOSIS — K2210 Ulcer of esophagus without bleeding: Secondary | ICD-10-CM | POA: Diagnosis not present

## 2015-04-18 DIAGNOSIS — E1140 Type 2 diabetes mellitus with diabetic neuropathy, unspecified: Secondary | ICD-10-CM | POA: Diagnosis present

## 2015-04-18 DIAGNOSIS — K921 Melena: Secondary | ICD-10-CM | POA: Diagnosis not present

## 2015-04-18 DIAGNOSIS — N2581 Secondary hyperparathyroidism of renal origin: Secondary | ICD-10-CM | POA: Diagnosis present

## 2015-04-18 DIAGNOSIS — Z87891 Personal history of nicotine dependence: Secondary | ICD-10-CM

## 2015-04-18 DIAGNOSIS — I255 Ischemic cardiomyopathy: Secondary | ICD-10-CM | POA: Diagnosis present

## 2015-04-18 DIAGNOSIS — Z7982 Long term (current) use of aspirin: Secondary | ICD-10-CM | POA: Diagnosis not present

## 2015-04-18 DIAGNOSIS — N19 Unspecified kidney failure: Secondary | ICD-10-CM | POA: Diagnosis not present

## 2015-04-18 DIAGNOSIS — D638 Anemia in other chronic diseases classified elsewhere: Secondary | ICD-10-CM | POA: Diagnosis present

## 2015-04-18 DIAGNOSIS — N400 Enlarged prostate without lower urinary tract symptoms: Secondary | ICD-10-CM | POA: Diagnosis present

## 2015-04-18 DIAGNOSIS — Z992 Dependence on renal dialysis: Secondary | ICD-10-CM | POA: Diagnosis not present

## 2015-04-18 DIAGNOSIS — D631 Anemia in chronic kidney disease: Secondary | ICD-10-CM | POA: Diagnosis not present

## 2015-04-18 DIAGNOSIS — K222 Esophageal obstruction: Secondary | ICD-10-CM | POA: Diagnosis not present

## 2015-04-18 DIAGNOSIS — E669 Obesity, unspecified: Secondary | ICD-10-CM | POA: Diagnosis present

## 2015-04-18 DIAGNOSIS — R5383 Other fatigue: Secondary | ICD-10-CM | POA: Diagnosis not present

## 2015-04-18 DIAGNOSIS — I2510 Atherosclerotic heart disease of native coronary artery without angina pectoris: Secondary | ICD-10-CM | POA: Diagnosis present

## 2015-04-18 DIAGNOSIS — D62 Acute posthemorrhagic anemia: Principal | ICD-10-CM | POA: Diagnosis present

## 2015-04-18 DIAGNOSIS — E1122 Type 2 diabetes mellitus with diabetic chronic kidney disease: Secondary | ICD-10-CM | POA: Diagnosis present

## 2015-04-18 DIAGNOSIS — E785 Hyperlipidemia, unspecified: Secondary | ICD-10-CM | POA: Diagnosis present

## 2015-04-18 DIAGNOSIS — K210 Gastro-esophageal reflux disease with esophagitis: Secondary | ICD-10-CM | POA: Diagnosis not present

## 2015-04-18 DIAGNOSIS — I120 Hypertensive chronic kidney disease with stage 5 chronic kidney disease or end stage renal disease: Secondary | ICD-10-CM | POA: Diagnosis present

## 2015-04-18 DIAGNOSIS — D649 Anemia, unspecified: Secondary | ICD-10-CM | POA: Diagnosis not present

## 2015-04-18 HISTORY — DX: Unspecified dementia, unspecified severity, without behavioral disturbance, psychotic disturbance, mood disturbance, and anxiety: F03.90

## 2015-04-18 HISTORY — DX: Dependence on renal dialysis: Z99.2

## 2015-04-18 HISTORY — DX: End stage renal disease: N18.6

## 2015-04-18 LAB — POC OCCULT BLOOD, ED: Fecal Occult Bld: NEGATIVE

## 2015-04-18 LAB — BASIC METABOLIC PANEL
ANION GAP: 13 (ref 5–15)
BUN: 64 mg/dL — ABNORMAL HIGH (ref 6–20)
CHLORIDE: 102 mmol/L (ref 101–111)
CO2: 27 mmol/L (ref 22–32)
Calcium: 8.7 mg/dL — ABNORMAL LOW (ref 8.9–10.3)
Creatinine, Ser: 12.39 mg/dL — ABNORMAL HIGH (ref 0.61–1.24)
GFR calc Af Amer: 4 mL/min — ABNORMAL LOW (ref >60)
GFR, EST NON AFRICAN AMERICAN: 4 mL/min — AB (ref >60)
GLUCOSE: 227 mg/dL — AB (ref 65–99)
Potassium: 4.3 mmol/L (ref 3.5–5.1)
SODIUM: 142 mmol/L (ref 135–145)

## 2015-04-18 LAB — CBC WITH DIFFERENTIAL/PLATELET
BASOS ABS: 0.1 10*3/uL (ref 0.0–0.1)
BASOS PCT: 1 % (ref 0–1)
Eosinophils Absolute: 0.2 10*3/uL (ref 0.0–0.7)
Eosinophils Relative: 2 % (ref 0–5)
HEMATOCRIT: 22.5 % — AB (ref 39.0–52.0)
HEMOGLOBIN: 7.1 g/dL — AB (ref 13.0–17.0)
Lymphocytes Relative: 25 % (ref 12–46)
Lymphs Abs: 2 10*3/uL (ref 0.7–4.0)
MCH: 31.3 pg (ref 26.0–34.0)
MCHC: 31.6 g/dL (ref 30.0–36.0)
MCV: 99.1 fL (ref 78.0–100.0)
MONO ABS: 0.6 10*3/uL (ref 0.1–1.0)
MONOS PCT: 7 % (ref 3–12)
NEUTROS PCT: 65 % (ref 43–77)
Neutro Abs: 5.1 10*3/uL (ref 1.7–7.7)
Platelets: 301 10*3/uL (ref 150–400)
RBC: 2.27 MIL/uL — ABNORMAL LOW (ref 4.22–5.81)
RDW: 21.5 % — ABNORMAL HIGH (ref 11.5–15.5)
WBC: 8 10*3/uL (ref 4.0–10.5)

## 2015-04-18 LAB — PREPARE RBC (CROSSMATCH)

## 2015-04-18 LAB — TROPONIN I: Troponin I: <0.03 ng/mL (ref <0.031)

## 2015-04-18 LAB — GLUCOSE, CAPILLARY
GLUCOSE-CAPILLARY: 89 mg/dL (ref 65–99)
GLUCOSE-CAPILLARY: 100 mg/dL — AB (ref 65–99)

## 2015-04-18 MED ORDER — LANTHANUM CARBONATE 500 MG PO CHEW: 1000.0000 mg | CHEWABLE_TABLET | Freq: Two times a day (BID) | ORAL | Status: DC

## 2015-04-18 MED ORDER — ACETAMINOPHEN 325 MG PO TABS: 650.0000 mg | ORAL_TABLET | Freq: Four times a day (QID) | ORAL | Status: DC | PRN

## 2015-04-18 MED ORDER — ACETAMINOPHEN 650 MG RE SUPP: 650.0000 mg | Freq: Four times a day (QID) | RECTAL | Status: DC | PRN

## 2015-04-18 MED ORDER — PANTOPRAZOLE SODIUM 40 MG IV SOLR
40.0000 mg | Freq: Every day | INTRAVENOUS | Status: DC
  Administered 2015-04-18: 40 mg via INTRAVENOUS
  Filled 2015-04-18 (×2): qty 40

## 2015-04-18 MED ORDER — SODIUM CHLORIDE 0.9 % IV SOLN: 250.0000 mL | INTRAVENOUS | Status: DC | PRN

## 2015-04-18 MED ORDER — EZETIMIBE 10 MG PO TABS
10.0000 mg | ORAL_TABLET | Freq: Every day | ORAL | Status: DC
  Administered 2015-04-18 – 2015-04-21 (×3): 10 mg via ORAL
  Filled 2015-04-18 (×5): qty 1

## 2015-04-18 MED ORDER — ONDANSETRON HCL 4 MG PO TABS: 4.0000 mg | ORAL_TABLET | Freq: Four times a day (QID) | ORAL | Status: DC | PRN

## 2015-04-18 MED ORDER — SODIUM CHLORIDE 0.9 % IJ SOLN
3.0000 mL | Freq: Two times a day (BID) | INTRAMUSCULAR | Status: DC
  Administered 2015-04-20 (×2): 3 mL via INTRAVENOUS

## 2015-04-18 MED ORDER — SODIUM CHLORIDE 0.9 % IJ SOLN: 3.0000 mL | INTRAMUSCULAR | Status: DC | PRN

## 2015-04-18 MED ORDER — INSULIN ASPART 100 UNIT/ML ~~LOC~~ SOLN
0.0000 [IU] | Freq: Three times a day (TID) | SUBCUTANEOUS | Status: DC
  Administered 2015-04-19 – 2015-04-20 (×2): 2 [IU] via SUBCUTANEOUS
  Administered 2015-04-20 – 2015-04-21 (×2): 1 [IU] via SUBCUTANEOUS

## 2015-04-18 MED ORDER — LANTHANUM CARBONATE 500 MG PO CHEW
1000.0000 mg | CHEWABLE_TABLET | Freq: Two times a day (BID) | ORAL | Status: DC
  Administered 2015-04-20 (×2): 1000 mg via ORAL
  Filled 2015-04-18 (×6): qty 2

## 2015-04-18 MED ORDER — SODIUM CHLORIDE 0.9 % IV BOLUS (SEPSIS): 250.0000 mL | Freq: Once | INTRAVENOUS | Status: DC

## 2015-04-18 MED ORDER — ONDANSETRON HCL 4 MG/2ML IJ SOLN: 4.0000 mg | Freq: Four times a day (QID) | INTRAMUSCULAR | Status: DC | PRN

## 2015-04-18 MED ORDER — DEXTROSE 50 % IV SOLN
INTRAVENOUS | Status: AC
  Filled 2015-04-18: qty 50

## 2015-04-18 NOTE — ED Notes (Signed)
POC occult card at bedside.

## 2015-04-18 NOTE — H&P (Signed)
PCP:   [name], MD   Chief Complaint:  Vomiting and dark stools  HPI:  64 year old male who  has a past medical history of Hyperthyroidism; GASTROENTERITIS, VIRAL (10/14/2009); ONYCHOMYCOSIS, TOENAILS (12/26/2007); GOITER, MULTINODULAR (12/26/2007); HYPERTHYROIDISM (02/02/2010); DIABETES MELLITUS, TYPE II (02/01/2010); DYSLIPIDEMIA (06/18/2007); GOUT (06/18/2007); Hypocalcemia (06/07/2010); DEMENTIA (09/02/2009); DEPRESSION (10/14/2009); PERIPHERAL NEUROPATHY (06/18/2007); HYPERTENSION (06/18/2007); CONGESTIVE HEART FAILURE ([id number]); DIASTOLIC HEART FAILURE, CHRONIC (02/06/2009); Unspecified hypotension (01/30/2010); PULMONARY NODULE, RIGHT LOWER LOBE (06/08/2009); GERD (06/18/2007); ESRD (08/04/2010); BENIGN PROSTATIC HYPERTROPHY (10/14/2009); GYNECOMASTIA (07/17/2010); NECK PAIN (07/31/2010); FOOT PAIN (08/12/2008); DIZZINESS (07/17/2010); Other malaise and fatigue (11/24/2009); GAIT DISTURBANCE (03/03/2010); DYSPNEA (10/29/2008); CHEST PAIN (03/29/2010); TRANSAMINASES, SERUM, ELEVATED (02/01/2010); COLONIC POLYPS, HX OF (10/14/2009); Anemia (06/16/2011); Ischemic cardiomyopathy (06/16/2011); Hyperparathyroidism, secondary (06/16/2011); OSA on CPAP (10/16/2011); Hyperlipidemia (10/16/2011); CAD, NATIVE VESSEL (02/06/2009); Sleep apnea; Allergic rhinitis, cause unspecified (02/24/2014); Transfusion history; and Hemodialysis access, fistula mature.  Today came to the hospital after patient started having vomiting and dark colored stool since he had EGD 10 days ago. Patient underwent EGD on 04/07/2015 which showed possible distal esophageal stricture and dilation of esophagus. No significant source of bleeding. Patient says that since the procedure he has been not feeling well has been having headache, fatigue, dark stools 1-2 episodes of vomiting everyday. Patient also has been having episodes of hypotension especially after hemodialysis. He also admits to having some chest discomfort but no shortness of breath. Denies fever, no  dysuria. In the ED patient was found to have hemoglobin 7.1, drop of hemoglobin from 10.9 as of 04/07/2015. Stool for occult blood was negative in the ED.  Allergies:   Allergies  Allergen Reactions  . Cephalexin Other (See Comments)    Tongue swelling  . Statins     Weak muscles      Past Medical History  Diagnosis Date  . Hyperthyroidism   . GASTROENTERITIS, VIRAL 10/14/2009  . ONYCHOMYCOSIS, TOENAILS 12/26/2007  . GOITER, MULTINODULAR 12/26/2007  . HYPERTHYROIDISM 02/02/2010  . DIABETES MELLITUS, TYPE II 02/01/2010  . DYSLIPIDEMIA 06/18/2007  . GOUT 06/18/2007  . Hypocalcemia 06/07/2010  . DEMENTIA 09/02/2009  . DEPRESSION 10/14/2009  . PERIPHERAL NEUROPATHY 06/18/2007  . HYPERTENSION 06/18/2007  . CONGESTIVE HEART FAILURE 06/18/2007  . DIASTOLIC HEART FAILURE, CHRONIC 02/06/2009  . Unspecified hypotension 01/30/2010  . PULMONARY NODULE, RIGHT LOWER LOBE 06/08/2009  . GERD 06/18/2007  . ESRD 08/04/2010  . BENIGN PROSTATIC HYPERTROPHY 10/14/2009  . GYNECOMASTIA 07/17/2010  . NECK PAIN 07/31/2010  . FOOT PAIN 08/12/2008  . DIZZINESS 07/17/2010  . Other malaise and fatigue 11/24/2009  . GAIT DISTURBANCE 03/03/2010  . DYSPNEA 10/29/2008  . CHEST PAIN 03/29/2010  . TRANSAMINASES, SERUM, ELEVATED 02/01/2010  . COLONIC POLYPS, HX OF 10/14/2009  . Anemia 06/16/2011  . Ischemic cardiomyopathy 06/16/2011  . Hyperparathyroidism, secondary 06/16/2011  . OSA on CPAP 10/16/2011  . Hyperlipidemia 10/16/2011  . CAD, NATIVE VESSEL 02/06/2009    saw Dr. Missy Sabins last jan 2013  . Sleep apnea     cpap machine and o2  . Allergic rhinitis, cause unspecified 02/24/2014  . Transfusion history     none recent  . Hemodialysis access, fistula mature     Dialysis [facility] ([place]) Right upper arm fistula    Past Surgical History  Procedure Laterality Date  . Stent 06/11/08    . Neck sugury  2/09  . Back surgury  1998  . Esophagogastroduodenoscopy  09/28/2011    Procedure:  ESOPHAGOGASTRODUODENOSCOPY (EGD);  Surgeon: Missy Sabins, MD;  Location: [facility]  ENDOSCOPY;  Service: Endoscopy;  Laterality: N/A;  . Givens capsule study  09/30/2011    Procedure: GIVENS CAPSULE STUDY;  Surgeon: Jeryl Columbia, MD;  Location: [facility] ENDOSCOPY;  Service: Endoscopy;  Laterality: N/A;  . Cholecystectomy    . Cardiac catheterization    . Tonsillectomy    . Av fistula placement  11/07/2011    Procedure: INSERTION OF ARTERIOVENOUS (AV) GORE-TEX GRAFT ARM;  Surgeon: Tinnie Gens, MD;  Location: [facility];  Service: Vascular;  Laterality: Left;  . Dialysis fistula creation  2009    right  . Insertion of dialysis catheter  2014    right side  . Insertion of dialysis catheter Left 02/11/2013    Procedure: INSERTION OF DIALYSIS CATHETER;  Surgeon: Conrad [place], MD;  Location: [facility];  Service: Vascular;  Laterality: Left;  Ultrasound guided  . Removal of a dialysis catheter Right 02/11/2013    Procedure: REMOVAL OF A DIALYSIS CATHETER;  Surgeon: Conrad Sarles, MD;  Location: [facility];  Service: Vascular;  Laterality: Right;  . Bascilic vein transposition Right 02/27/2013    Procedure: BASCILIC VEIN TRANSPOSITION;  Surgeon: Mal Misty, MD;  Location: [facility];  Service: Vascular;  Laterality: Right;  Right Basilic Vein Transposition   . Shuntogram N/A 09/20/2011    Procedure: Earney Mallet;  Surgeon: Conrad [place], MD;  Location: [facility] CATH LAB;  Service: Cardiovascular;  Laterality: N/A;  . Venogram N/A 01/26/2013    Procedure: VENOGRAM;  Surgeon: Angelia Mould, MD;  Location: [facility] CATH LAB;  Service: Cardiovascular;  Laterality: N/A;  . Esophagogastroduodenoscopy N/A 04/07/2015    Procedure: ESOPHAGOGASTRODUODENOSCOPY (EGD);  Surgeon: Teena Irani, MD;  Location: [facility] ENDOSCOPY;  Service: Endoscopy;  Laterality: N/A;  . Savory dilation N/A 04/07/2015    Procedure: SAVORY DILATION;  Surgeon: Teena Irani, MD;  Location: [facility] ENDOSCOPY;  Service: Endoscopy;  Laterality: N/A;    Prior to Admission medications     Medication Sig Start Date End Date Taking? Authorizing Provider  allopurinol (ZYLOPRIM) 100 MG tablet Take 1 tablet (100 mg total) by mouth daily. 01/13/14  Yes Biagio Borg, MD  amLODipine (NORVASC) 10 MG tablet Take 10 mg by mouth daily. 04/13/15  Yes Historical Provider, MD  aspirin 81 MG EC tablet Take 81 mg by mouth daily.    Yes Historical Provider, MD  calcium-vitamin D (OSCAL WITH D) 500-200 MG-UNIT per tablet Take 3 tablets by mouth 3 (three) times daily with meals.    Yes Historical Provider, MD  carvedilol (COREG) 25 MG tablet Take 0.5 tablets (12.5 mg total) by mouth 2 (two) times daily with a meal. Only on non-HD days 03/10/14  Yes Jolaine Artist, MD  cinacalcet (SENSIPAR) 60 MG tablet Take 60 mg by mouth 2 (two) times daily.    Yes Historical Provider, MD  ezetimibe (ZETIA) 10 MG tablet Take 1 tablet (10 mg total) by mouth daily. 12/29/14  Yes Jolaine Artist, MD  gabapentin (NEURONTIN) 300 MG capsule TAKE ONE CAPSULE BY MOUTH AT BEDTIME AS NEEDED FOR PAIN 04/13/15  Yes Biagio Borg, MD  lanthanum (FOSRENOL) 1000 MG chewable tablet Chew 1,000 mg by mouth 2 times daily at 12 noon and 4 pm.   Yes Historical Provider, MD  multivitamin (RENA-VIT) TABS tablet Take 1 tablet by mouth daily.     Yes Historical Provider, MD  nateglinide (STARLIX) 60 MG tablet Take 1 tablet (60 mg total) by mouth 3 (three) times daily with meals. 03/14/15  Yes Renato Shin,  MD  pregabalin (LYRICA) 100 MG capsule Take 100 mg by mouth daily as needed (nerve pain).   Yes Historical Provider, MD  ranitidine (ZANTAC) 300 MG tablet Take 300 mg by mouth at bedtime.   Yes Historical Provider, MD  colesevelam [facility]) 625 MG tablet Take 3 tablets (1,875 mg total) by mouth 2 (two) times daily with a meal. Patient not taking: Reported on 04/18/2015 01/13/14   Biagio Borg, MD    Social History:  reports that he has quit smoking. His smoking use included Cigarettes. He has a 25 pack-year smoking history. He quit  smokeless tobacco use about 9 years ago. He reports that he drinks alcohol. He reports that he does not use illicit drugs.  Family History  Problem Relation Age of Onset  . Heart disease Father   . Heart disease Sister     All the positives are listed in BOLD  Review of Systems:  HEENT: Headache, blurred vision, runny nose, sore throat Neck: Hypothyroidism, hyperthyroidism,,lymphadenopathy Chest : Shortness of breath, history of COPD, Asthma Heart : Chest pain, history of coronary arterey disease GI:  Nausea, vomiting, diarrhea, constipation, GERD GU: Dysuria, urgency, frequency of urination, hematuria Neuro: Stroke, seizures, syncope Psych: Depression, anxiety, hallucinations   Physical Exam: Blood pressure 117/61, pulse 81, temperature 98.9 F (37.2 C), temperature source Oral, resp. rate 16, SpO2 98 %. Constitutional:   Patient is a well-developed and well-nourished male in no acute distress and cooperative with exam. Head: Normocephalic and atraumatic Mouth: Mucus membranes moist Eyes: PERRL, EOMI, conjunctivae normal Neck: Supple, No Thyromegaly Cardiovascular: RRR, S1 normal, S2 normal Pulmonary/Chest: CTAB, no wheezes, rales, or rhonchi Abdominal: Soft. Non-tender, non-distended, bowel sounds are normal, no masses, organomegaly, or guarding present.  Neurological: A&O x3, Strength is normal and symmetric bilaterally, cranial nerve II-XII are grossly intact, no focal motor deficit, sensory intact to light touch bilaterally.  Extremities : No Cyanosis, Clubbing or Edema  Labs on Admission:  Basic Metabolic Panel:  Recent Labs Lab 04/18/15 1404  NA 142  K 4.3  CL 102  CO2 27  GLUCOSE 227*  BUN 64*  CREATININE 12.39*  CALCIUM 8.7*    Recent Labs Lab 04/18/15 1404  WBC 8.0  NEUTROABS 5.1  HGB 7.1*  HCT 22.5*  MCV 99.1  PLT 301   Cardiac Enzymes:  Recent Labs Lab 04/18/15 1404  TROPONINI <0.03     EKG: Independently reviewed. Sinus  rhythm   Assessment/Plan Active Problems:   Anemia   Acute blood loss anemia   Dialysis patient   Anemia Patient hemoglobin dropped to 7.1, from 10.9 on 04/07/2015, questionable GI bleed. Stool for occult blood is negative. Will transfuse 1 unit PRBC. Check CBC in a.m.  Vomiting and dark colored stool Patient comes as a black colored stool, stool for occult blood is negative in the ED. GI to see the patient for further recommendations. Dr. Christy Gentles spoke to Dr. Paulita Fujita who will follow the patient.  End-stage renal disease Patient is on hemodialysis Tuesday Thursday and Saturday. Hemodialysis in a.m. I called and discussed Dr. Joelyn Oms who is aware of patient's transfer to [facility].  Diabetes mellitus Hold the oral hypoglycemic agents, initiate sliding scale insulin with NovoLog.  DVT prophylaxis SCDs  Code status:Full code  Family discussion: Admission, patients condition and plan of care including tests being ordered have been discussed with the patient and his wife at bedside who indicate understanding and agree with the plan and Code Status.   Time Spent  on Admission: 60 minutes  [facility] Pager: [phone] 04/18/2015, 4:11 PM  If 7PM-7AM, please contact night-coverage  [email or web address]  Password [email or web address]

## 2015-04-18 NOTE — ED Notes (Addendum)
Attempted to call report to [phone].  Transferred three times with no answer.

## 2015-04-18 NOTE — Progress Notes (Addendum)
Spoke with ED RN briefly. States she will call back to give report.

## 2015-04-18 NOTE — ED Provider Notes (Signed)
CSN: [id number]     Arrival date & time 04/18/15  1301 History   First MD Initiated Contact with Patient 04/18/15 1325     Chief Complaint  Patient presents with  . Fatigue  . Headache  . dark stools     Patient is a 64 y.o. male presenting with weakness. The history is provided by the patient.  Weakness This is a new problem. The current episode started more than 2 days ago. The problem occurs daily. The problem has been gradually worsening. Pertinent negatives include no abdominal pain and no shortness of breath. The symptoms are aggravated by walking. The symptoms are relieved by rest.  pt presents with fatigue He reports he has felt tired/weak since having EGD on 04/07/15 He has had CP recently but none at this time No new SOB He reports mild HA He reports recent dark stools He reports he vomited blood recently but none in past 24 hours No syncope reported He is not on anticoagulants He is on dialysis - last session 2 days ago  Past Medical History  Diagnosis Date  . Hyperthyroidism   . GASTROENTERITIS, VIRAL 10/14/2009  . ONYCHOMYCOSIS, TOENAILS 12/26/2007  . GOITER, MULTINODULAR 12/26/2007  . HYPERTHYROIDISM 02/02/2010  . DIABETES MELLITUS, TYPE II 02/01/2010  . DYSLIPIDEMIA 06/18/2007  . GOUT 06/18/2007  . Hypocalcemia 06/07/2010  . DEMENTIA 09/02/2009  . DEPRESSION 10/14/2009  . PERIPHERAL NEUROPATHY 06/18/2007  . HYPERTENSION 06/18/2007  . CONGESTIVE HEART FAILURE 06/18/2007  . DIASTOLIC HEART FAILURE, CHRONIC 02/06/2009  . Unspecified hypotension 01/30/2010  . PULMONARY NODULE, RIGHT LOWER LOBE 06/08/2009  . GERD 06/18/2007  . ESRD 08/04/2010  . BENIGN PROSTATIC HYPERTROPHY 10/14/2009  . GYNECOMASTIA 07/17/2010  . NECK PAIN 07/31/2010  . FOOT PAIN 08/12/2008  . DIZZINESS 07/17/2010  . Other malaise and fatigue 11/24/2009  . GAIT DISTURBANCE 03/03/2010  . DYSPNEA 10/29/2008  . CHEST PAIN 03/29/2010  . TRANSAMINASES, SERUM, ELEVATED 02/01/2010  . COLONIC POLYPS, HX OF 10/14/2009  . Anemia  06/16/2011  . Ischemic cardiomyopathy 06/16/2011  . Hyperparathyroidism, secondary 06/16/2011  . OSA on CPAP 10/16/2011  . Hyperlipidemia 10/16/2011  . CAD, NATIVE VESSEL 02/06/2009    saw Dr. Missy Sabins last jan 2013  . Sleep apnea     cpap machine and o2  . Allergic rhinitis, cause unspecified 02/24/2014  . Transfusion history     none recent  . Hemodialysis access, fistula mature     Dialysis T-Th-Sa ([place]) Right upper arm fistula   Past Surgical History  Procedure Laterality Date  . Stent 06/11/08    . Neck sugury  2/09  . Back surgury  1998  . Esophagogastroduodenoscopy  09/28/2011    Procedure: ESOPHAGOGASTRODUODENOSCOPY (EGD);  Surgeon: Missy Sabins, MD;  Location: [facility] ENDOSCOPY;  Service: Endoscopy;  Laterality: N/A;  . Givens capsule study  09/30/2011    Procedure: GIVENS CAPSULE STUDY;  Surgeon: Jeryl Columbia, MD;  Location: [facility] ENDOSCOPY;  Service: Endoscopy;  Laterality: N/A;  . Cholecystectomy    . Cardiac catheterization    . Tonsillectomy    . Av fistula placement  11/07/2011    Procedure: INSERTION OF ARTERIOVENOUS (AV) GORE-TEX GRAFT ARM;  Surgeon: Tinnie Gens, MD;  Location: [place];  Service: Vascular;  Laterality: Left;  . Dialysis fistula creation  2009    right  . Insertion of dialysis catheter  2014    right side  . Insertion of dialysis catheter Left 02/11/2013    Procedure: INSERTION OF DIALYSIS  CATHETER;  Surgeon: Conrad Bear Creek, MD;  Location: [place];  Service: Vascular;  Laterality: Left;  Ultrasound guided  . Removal of a dialysis catheter Right 02/11/2013    Procedure: REMOVAL OF A DIALYSIS CATHETER;  Surgeon: Conrad Antioch, MD;  Location: [place];  Service: Vascular;  Laterality: Right;  . Bascilic vein transposition Right 02/27/2013    Procedure: BASCILIC VEIN TRANSPOSITION;  Surgeon: Mal Misty, MD;  Location: [place];  Service: Vascular;  Laterality: Right;  Right Basilic Vein Transposition   . Shuntogram N/A 09/20/2011    Procedure:  Earney Mallet;  Surgeon: Conrad , MD;  Location: [facility] CATH LAB;  Service: Cardiovascular;  Laterality: N/A;  . Venogram N/A 01/26/2013    Procedure: VENOGRAM;  Surgeon: Angelia Mould, MD;  Location: [facility] CATH LAB;  Service: Cardiovascular;  Laterality: N/A;  . Esophagogastroduodenoscopy N/A 04/07/2015    Procedure: ESOPHAGOGASTRODUODENOSCOPY (EGD);  Surgeon: Teena Irani, MD;  Location: [facility] ENDOSCOPY;  Service: Endoscopy;  Laterality: N/A;  . Savory dilation N/A 04/07/2015    Procedure: SAVORY DILATION;  Surgeon: Teena Irani, MD;  Location: [facility] ENDOSCOPY;  Service: Endoscopy;  Laterality: N/A;   Family History  Problem Relation Age of Onset  . Heart disease Father   . Heart disease Sister    History  Substance Use Topics  . Smoking status: Former Smoker -- 1.00 packs/day for 25 years    Types: Cigarettes  . Smokeless tobacco: Former Systems developer    Quit date: 10/01/2005     Comment: Quit smoking 2007 Smoked x 25 years 1/2 ppd.  . Alcohol Use: 0.0 oz/week    0 Standard drinks or equivalent per week     Comment: occassional     Review of Systems  Constitutional: Positive for fatigue. Negative for fever.  Respiratory: Negative for shortness of breath.   Gastrointestinal: Negative for abdominal pain.  Neurological: Positive for weakness.  All other systems reviewed and are negative.     Allergies  Cephalexin and Statins  Home Medications   Prior to Admission medications   Medication Sig Start Date End Date Taking? Authorizing Provider  allopurinol (ZYLOPRIM) 100 MG tablet Take 1 tablet (100 mg total) by mouth daily. 01/13/14  Yes Biagio Borg, MD  amLODipine (NORVASC) 10 MG tablet Take 10 mg by mouth daily. 04/13/15  Yes Historical Provider, MD  aspirin 81 MG EC tablet Take 81 mg by mouth daily.    Yes Historical Provider, MD  calcium-vitamin D (OSCAL WITH D) 500-200 MG-UNIT per tablet Take 3 tablets by mouth 3 (three) times daily with meals.    Yes Historical Provider, MD  carvedilol  (COREG) 25 MG tablet Take 0.5 tablets (12.5 mg total) by mouth 2 (two) times daily with a meal. Only on non-HD days 03/10/14  Yes Jolaine Artist, MD  cinacalcet (SENSIPAR) 60 MG tablet Take 60 mg by mouth 2 (two) times daily.    Yes Historical Provider, MD  ezetimibe (ZETIA) 10 MG tablet Take 1 tablet (10 mg total) by mouth daily. 12/29/14  Yes Jolaine Artist, MD  gabapentin (NEURONTIN) 300 MG capsule TAKE ONE CAPSULE BY MOUTH AT BEDTIME AS NEEDED FOR PAIN 04/13/15  Yes Biagio Borg, MD  lanthanum (FOSRENOL) 1000 MG chewable tablet Chew 1,000 mg by mouth 2 times daily at 12 noon and 4 pm.   Yes Historical Provider, MD  multivitamin (RENA-VIT) TABS tablet Take 1 tablet by mouth daily.     Yes Historical Provider, MD  nateglinide Letta Median)  60 MG tablet Take 1 tablet (60 mg total) by mouth 3 (three) times daily with meals. 03/14/15  Yes Renato Shin, MD  pregabalin (LYRICA) 100 MG capsule Take 100 mg by mouth daily as needed (nerve pain).   Yes Historical Provider, MD  ranitidine (ZANTAC) 300 MG tablet Take 300 mg by mouth at bedtime.   Yes Historical Provider, MD  colesevelam (WELCHOL) 625 MG tablet Take 3 tablets (1,875 mg total) by mouth 2 (two) times daily with a meal. Patient not taking: Reported on 04/18/2015 01/13/14   Biagio Borg, MD   BP 89/51 mmHg  Pulse 87  Temp(Src) 98.9 F (37.2 C) (Oral)  Resp 19  SpO2 95% Physical Exam CONSTITUTIONAL: Well developed/well nourished HEAD: Normocephalic/atraumatic EYES: EOMI/PERRL ENMT: Mucous membranes moist NECK: supple no meningeal signs SPINE/BACK:entire spine nontender CV: S1/S2 noted LUNGS: Lungs are clear to auscultation bilaterally, no apparent distress ABDOMEN: soft, nontender, no rebound or guarding, bowel sounds noted throughout abdomen Rectal - stool color brown, no blood/melena, hemoccult negative, chaperone present NEURO: Pt is awake/alert/appropriate, moves all extremitiesx4.  No facial droop.   EXTREMITIES: pulses  normal/equal, full ROM, dialysis access to right Ue with thrill noted SKIN: warm, color normal PSYCH: no abnormalities of mood noted, alert and oriented to situation  ED Course  Procedures  Labs Review Labs Reviewed  BASIC METABOLIC PANEL - Abnormal; Notable for the following:    Glucose, Bld 227 (*)    BUN 64 (*)    Creatinine, Ser 12.39 (*)    Calcium 8.7 (*)    GFR calc non Af Amer 4 (*)    GFR calc Af Amer 4 (*)    All other components within normal limits  CBC WITH DIFFERENTIAL/PLATELET - Abnormal; Notable for the following:    RBC 2.27 (*)    Hemoglobin 7.1 (*)    HCT 22.5 (*)    RDW 21.5 (*)    All other components within normal limits  TROPONIN I  POC OCCULT BLOOD, ED  TYPE AND SCREEN  PREPARE RBC (CROSSMATCH)      EKG Interpretation   Date/Time:  Monday April 18 2015 13:27:15 EDT Ventricular Rate:  88 PR Interval:  191 QRS Duration: 104 QT Interval:  360 QTC Calculation: 435 R Axis:   74 Text Interpretation:  Sinus rhythm Borderline repolarization abnormality  No significant change since last tracing Confirmed by Christy Gentles  MD, Maleyah Evans  [phone]) on 04/18/2015 1:35:37 PM     2:07 PM Pt with weakness/fatigue ever since undergoing EGD earlier in the month for dysphagia, found to have stricture Labs pending at this time 3:32 PM Pt noted to have acute anemia He is hemodynamically stable He will need PRBC transfusion He agrees to blood transfusion D/w dr Darrick Meigs, will admit to [facility] due to h/o dialysis (due tomorrow) D/w dr outlaw with [facility], will see patient at [facility] Patient/family agreeable BP 117/61 mmHg  Pulse 81  Temp(Src) 98.9 F (37.2 C) (Oral)  Resp 16  SpO2 98%   MDM   Final diagnoses:  Renal failure  Acute blood loss anemia    Nursing notes including past medical history and social history reviewed and considered in documentation Labs/vital reviewed myself and considered during evaluation Previous records reviewed and  considered     Ripley Fraise, MD 04/18/15 1534

## 2015-04-18 NOTE — ED Notes (Signed)
Pt states that ever since he had procedure to have esophagus stretched he has been weak, headache and having dark tarry stools.

## 2015-04-18 NOTE — ED Notes (Signed)
Report given to [facility].  Patient going to 6E21C.  Belongings with spouse.

## 2015-04-19 ENCOUNTER — Inpatient Hospital Stay (HOSPITAL_COMMUNITY): Payer: Medicare Other | Admitting: Certified Registered Nurse Anesthetist

## 2015-04-19 ENCOUNTER — Encounter (HOSPITAL_COMMUNITY)
Admission: EM | Disposition: A | Discharge status: Home / Self Care | Payer: Self-pay | Source: Home / Self Care | Attending: Internal Medicine

## 2015-04-19 ENCOUNTER — Encounter (HOSPITAL_COMMUNITY): Payer: Self-pay | Admitting: Certified Registered Nurse Anesthetist

## 2015-04-19 DIAGNOSIS — K921 Melena: Secondary | ICD-10-CM

## 2015-04-19 HISTORY — PX: ESOPHAGOGASTRODUODENOSCOPY: SHX5428

## 2015-04-19 LAB — COMPREHENSIVE METABOLIC PANEL
ALT: 24 U/L (ref 17–63)
ANION GAP: 12 (ref 5–15)
AST: 20 U/L (ref 15–41)
Albumin: 3.4 g/dL — ABNORMAL LOW (ref 3.5–5.0)
Alkaline Phosphatase: 91 U/L (ref 38–126)
BUN: 70 mg/dL — ABNORMAL HIGH (ref 6–20)
CALCIUM: 9.2 mg/dL (ref 8.9–10.3)
CHLORIDE: 104 mmol/L (ref 101–111)
CO2: 27 mmol/L (ref 22–32)
Creatinine, Ser: 14.92 mg/dL — ABNORMAL HIGH (ref 0.61–1.24)
GFR calc Af Amer: 3 mL/min — ABNORMAL LOW (ref >60)
GFR calc non Af Amer: 3 mL/min — ABNORMAL LOW (ref >60)
GLUCOSE: 90 mg/dL (ref 65–99)
POTASSIUM: 5.3 mmol/L — AB (ref 3.5–5.1)
Sodium: 143 mmol/L (ref 135–145)
Total Bilirubin: 0.8 mg/dL (ref 0.3–1.2)
Total Protein: 6.7 g/dL (ref 6.5–8.1)

## 2015-04-19 LAB — GLUCOSE, CAPILLARY
GLUCOSE-CAPILLARY: 84 mg/dL (ref 65–99)
GLUCOSE-CAPILLARY: 89 mg/dL (ref 65–99)
GLUCOSE-CAPILLARY: 164 mg/dL — AB (ref 65–99)
Glucose-Capillary: 169 mg/dL — ABNORMAL HIGH (ref 65–99)

## 2015-04-19 LAB — CBC
HEMATOCRIT: 24.6 % — AB (ref 39.0–52.0)
Hemoglobin: 7.7 g/dL — ABNORMAL LOW (ref 13.0–17.0)
MCH: 30.1 pg (ref 26.0–34.0)
MCHC: 31.3 g/dL (ref 30.0–36.0)
MCV: 96.1 fL (ref 78.0–100.0)
Platelets: 325 10*3/uL (ref 150–400)
RBC: 2.56 MIL/uL — ABNORMAL LOW (ref 4.22–5.81)
RDW: 21.8 % — ABNORMAL HIGH (ref 11.5–15.5)
WBC: 8.1 10*3/uL (ref 4.0–10.5)

## 2015-04-19 LAB — HEMOGLOBIN A1C
Hgb A1c MFr Bld: 6.9 % — ABNORMAL HIGH (ref 4.8–5.6)
Mean Plasma Glucose: 151 mg/dL

## 2015-04-19 SURGERY — EGD (ESOPHAGOGASTRODUODENOSCOPY)
Anesthesia: Monitor Anesthesia Care

## 2015-04-19 MED ORDER — SODIUM CHLORIDE 0.9 % IV SOLN: 100.0000 mL | INTRAVENOUS | Status: DC | PRN

## 2015-04-19 MED ORDER — MEPERIDINE HCL 25 MG/ML IJ SOLN: 6.2500 mg | INTRAMUSCULAR | Status: DC | PRN

## 2015-04-19 MED ORDER — PENTAFLUOROPROP-TETRAFLUOROETH EX AERO: 1.0000 "application " | INHALATION_SPRAY | CUTANEOUS | Status: DC | PRN

## 2015-04-19 MED ORDER — LIDOCAINE HCL (CARDIAC) 20 MG/ML IV SOLN
INTRAVENOUS | Status: DC | PRN
  Administered 2015-04-19: 100 mg via INTRAVENOUS

## 2015-04-19 MED ORDER — HEPARIN SODIUM (PORCINE) 1000 UNIT/ML DIALYSIS: 1000.0000 [IU] | INTRAMUSCULAR | Status: DC | PRN

## 2015-04-19 MED ORDER — ALTEPLASE 2 MG IJ SOLR
2.0000 mg | Freq: Once | INTRAMUSCULAR | Status: AC | PRN
  Filled 2015-04-19: qty 2

## 2015-04-19 MED ORDER — PROPOFOL 10 MG/ML IV BOLUS
INTRAVENOUS | Status: DC | PRN
  Administered 2015-04-19: 30 mg via INTRAVENOUS

## 2015-04-19 MED ORDER — LIDOCAINE-PRILOCAINE 2.5-2.5 % EX CREA
1.0000 "application " | TOPICAL_CREAM | CUTANEOUS | Status: DC | PRN
  Filled 2015-04-19: qty 5

## 2015-04-19 MED ORDER — PROPOFOL INFUSION 10 MG/ML OPTIME
INTRAVENOUS | Status: DC | PRN
  Administered 2015-04-19: 200 ug/kg/min via INTRAVENOUS

## 2015-04-19 MED ORDER — LIDOCAINE HCL (PF) 1 % IJ SOLN: 5.0000 mL | INTRAMUSCULAR | Status: DC | PRN

## 2015-04-19 MED ORDER — BUTAMBEN-TETRACAINE-BENZOCAINE 2-2-14 % EX AERO
INHALATION_SPRAY | CUTANEOUS | Status: DC | PRN
  Administered 2015-04-19: 2 via TOPICAL

## 2015-04-19 MED ORDER — SODIUM CHLORIDE 0.9 % IV SOLN
INTRAVENOUS | Status: DC | PRN
  Administered 2015-04-19: 13:00:00 via INTRAVENOUS

## 2015-04-19 MED ORDER — HYDROMORPHONE HCL 1 MG/ML IJ SOLN
0.2500 mg | INTRAMUSCULAR | Status: DC | PRN
  Administered 2015-04-21 (×2): 0.5 mg via INTRAVENOUS
  Filled 2015-04-19 (×2): qty 1

## 2015-04-19 MED ORDER — PANTOPRAZOLE SODIUM 40 MG IV SOLR
40.0000 mg | Freq: Two times a day (BID) | INTRAVENOUS | Status: DC
  Administered 2015-04-20 (×2): 40 mg via INTRAVENOUS
  Filled 2015-04-19 (×4): qty 40

## 2015-04-19 MED ORDER — ONDANSETRON HCL 4 MG/2ML IJ SOLN: 4.0000 mg | Freq: Once | INTRAMUSCULAR | Status: AC | PRN

## 2015-04-19 MED ORDER — NEPRO/CARBSTEADY PO LIQD: 237.0000 mL | ORAL | Status: DC | PRN

## 2015-04-19 NOTE — Consult Note (Signed)
[name] 04/19/2015 [place] Agent Requesting Physician:  [place]  Reason for Consult:  comanagement of ESRD HPI:  42M ESRD THS [place] [facility] via AVF admitted with worsened anemia, ?melena, emesis s/p EGD with ? Distal esphageal stricture.  Hb have been trending down at outpt unit.  Recently restarted ESA.  Having more pronounced fatigue post HD during this period.  Poor PO.  Occ CP and SOB.    Filed Weights   04/18/15 2030  Weight: 110.2 kg (242 lb 15.2 oz)      ROS Balance of 12 systems is negative w/ exceptions as above  Outpt HD Orders Unit: [facility] Days: THS Time:  4h Dialyzer: F180 EDW: 108.5kg K/Ca: 2/2.25 Access: RUE AVF Needle Size: 15g BFR/DFR: 450/800 UF Proflie: profile 4 VDRA: [name] 2qTx EPO: mircera 75q2wk last rec 7/12 IV Fe: no scheduled Fe Heparin: none Most Recent Phos / PTH: 3.6 / 560 Most Recent TSAT / Ferritin: 29 / 1960 Most Recent eKT/V: 1.47 Treatment Adherence: excellent  PMH  Past Medical History  Diagnosis Date  . Hyperthyroidism   . GASTROENTERITIS, VIRAL 10/14/2009  . ONYCHOMYCOSIS, TOENAILS 12/26/2007  . GOITER, MULTINODULAR 12/26/2007  . HYPERTHYROIDISM 02/02/2010  . DIABETES MELLITUS, TYPE II 02/01/2010  . DYSLIPIDEMIA 06/18/2007  . GOUT 06/18/2007  . Hypocalcemia 06/07/2010  . DEPRESSION 10/14/2009  . PERIPHERAL NEUROPATHY 06/18/2007  . HYPERTENSION 06/18/2007  . CONGESTIVE HEART FAILURE 06/18/2007  . DIASTOLIC HEART FAILURE, CHRONIC 02/06/2009  . Unspecified hypotension 01/30/2010  . PULMONARY NODULE, RIGHT LOWER LOBE 06/08/2009  . GERD 06/18/2007  . BENIGN PROSTATIC HYPERTROPHY 10/14/2009  . GYNECOMASTIA 07/17/2010  . NECK PAIN 07/31/2010  . FOOT PAIN 08/12/2008  . DIZZINESS 07/17/2010  . Other malaise and fatigue 11/24/2009  . GAIT DISTURBANCE 03/03/2010  . DYSPNEA 10/29/2008  . CHEST PAIN 03/29/2010  . TRANSAMINASES, SERUM, ELEVATED 02/01/2010  . COLONIC POLYPS, HX OF 10/14/2009  . Anemia 06/16/2011  . Ischemic cardiomyopathy 06/16/2011   . Hyperparathyroidism, secondary 06/16/2011  . OSA on CPAP 10/16/2011  . Hyperlipidemia 10/16/2011  . CAD, NATIVE VESSEL 02/06/2009    saw Dr. Missy Sabins last jan 2013  . Sleep apnea     cpap machine and o2  . Allergic rhinitis, cause unspecified 02/24/2014  . Transfusion history     none recent  . Hemodialysis access, fistula mature     Dialysis T-Th-Sa ([place]) Right upper arm fistula  . ESRD (end stage renal disease) on dialysis 08/04/2010    "TTS;  " (04/18/2015)  . Dementia [id number]   PSH  Past Surgical History  Procedure Laterality Date  . Coronary angioplasty with stent placement  06/11/2008  . Cervical spine surgery  2/09    "to repair nerve problems in my left arm"  . Back surgery  1998  . Esophagogastroduodenoscopy  09/28/2011    Procedure: ESOPHAGOGASTRODUODENOSCOPY (EGD);  Surgeon: Missy Sabins, MD;  Location[facility] ENDOSCOPY;  Service: Endoscopy;  Laterality: N/A;  . Givens capsule study  09/30/2011    Procedure: GIVENS CAPSULE STUDY;  Surgeon: Jeryl Columbia, MD;  Location: [facility] ENDOSCOPY;  Service: Endoscopy;  Laterality: N/A;  . Cholecystectomy    . Coronary angioplasty with stent placement  06/2007    TAXUS stent to RCA/notes 01/31/2011  . Tonsillectomy    . Av fistula placement  11/07/2011    Procedure: INSERTION OF ARTERIOVENOUS (AV) GORE-TEX GRAFT ARM;  Surgeon: Tinnie Gens, MD;  Location: [place];  Service: Vascular;  Laterality: Left;  . Arteriovenous  graft placement Right 2009    forearm/notes 02/01/2011  . Insertion of dialysis catheter Right 2014  . Insertion of dialysis catheter Left 02/11/2013    Procedure: INSERTION OF DIALYSIS CATHETER;  Surgeon: Conrad Crooked Creek, MD;  Location: [place];  Service: Vascular;  Laterality: Left;  Ultrasound guided  . Removal of a dialysis catheter Right 02/11/2013    Procedure: REMOVAL OF A DIALYSIS CATHETER;  Surgeon: Conrad Sperry, MD;  Location: [place];  Service: Vascular;  Laterality: Right;  . Bascilic vein transposition  Right 02/27/2013    Procedure: BASCILIC VEIN TRANSPOSITION;  Surgeon: Mal Misty, MD;  Location: [employer];  Service: Vascular;  Laterality: Right;  Right Basilic Vein Transposition   . Shuntogram N/A 09/20/2011    Procedure: Earney Mallet;  Surgeon: Conrad Carbon, MD;  Location: [facility] CATH LAB;  Service: Cardiovascular;  Laterality: N/A;  . Venogram N/A 01/26/2013    Procedure: VENOGRAM;  Surgeon: Angelia Mould, MD;  Location: [facility] CATH LAB;  Service: Cardiovascular;  Laterality: N/A;  . Esophagogastroduodenoscopy N/A 04/07/2015    Procedure: ESOPHAGOGASTRODUODENOSCOPY (EGD);  Surgeon: Teena Irani, MD;  Location: [facility] ENDOSCOPY;  Service: Endoscopy;  Laterality: N/A;  . Savory dilation N/A 04/07/2015    Procedure: SAVORY DILATION;  Surgeon: Teena Irani, MD;  Location: [facility] ENDOSCOPY;  Service: Endoscopy;  Laterality: N/A;  . Foreign body removal  09/2003    via upper endoscopy/notes 02/12/2011   FH  Family History  Problem Relation Age of Onset  . Heart disease Father   . Heart disease Sister    [place]  reports that he has quit smoking. His smoking use included Cigarettes. He has a 25 pack-year smoking history. He quit smokeless tobacco use about 9 years ago. He reports that he drinks alcohol. He reports that he does not use illicit drugs. Allergies  Allergies  Allergen Reactions  . Cephalexin Other (See Comments)    Tongue swelling  . Statins     Weak muscles   Home medications Prior to Admission medications   Medication Sig Start Date End Date Taking? Authorizing Provider  allopurinol (ZYLOPRIM) 100 MG tablet Take 1 tablet (100 mg total) by mouth daily. 01/13/14  Yes Biagio Borg, MD  amLODipine (NORVASC) 10 MG tablet Take 10 mg by mouth daily. 04/13/15  Yes Historical Provider, MD  aspirin 81 MG EC tablet Take 81 mg by mouth daily.    Yes Historical Provider, MD  calcium-vitamin D (OSCAL WITH D) 500-200 MG-UNIT per tablet Take 3 tablets by mouth 3 (three) times daily with meals.    Yes Historical  Provider, MD  carvedilol (COREG) 25 MG tablet Take 0.5 tablets (12.5 mg total) by mouth 2 (two) times daily with a meal. Only on non-HD days 03/10/14  Yes Jolaine Artist, MD  cinacalcet (SENSIPAR) 60 MG tablet Take 60 mg by mouth 2 (two) times daily.    Yes Historical Provider, MD  ezetimibe (ZETIA) 10 MG tablet Take 1 tablet (10 mg total) by mouth daily. 12/29/14  Yes Jolaine Artist, MD  gabapentin (NEURONTIN) 300 MG capsule TAKE ONE CAPSULE BY MOUTH AT BEDTIME AS NEEDED FOR PAIN 04/13/15  Yes Biagio Borg, MD  lanthanum (FOSRENOL) 1000 MG chewable tablet Chew 1,000 mg by mouth 2 times daily at 12 noon and 4 pm.   Yes Historical Provider, MD  multivitamin (RENA-VIT) TABS tablet Take 1 tablet by mouth daily.     Yes Historical Provider, MD  nateglinide (STARLIX) 60 MG tablet Take  1 tablet (60 mg total) by mouth 3 (three) times daily with meals. 03/14/15  Yes Renato Shin, MD  pregabalin (LYRICA) 100 MG capsule Take 100 mg by mouth daily as needed (nerve pain).   Yes Historical Provider, MD  ranitidine (ZANTAC) 300 MG tablet Take 300 mg by mouth at bedtime.   Yes Historical Provider, MD  colesevelam [facility]) 625 MG tablet Take 3 tablets (1,875 mg total) by mouth 2 (two) times daily with a meal. Patient not taking: Reported on 04/18/2015 01/13/14   Biagio Borg, MD    Current Medications Scheduled Meds: . ezetimibe  10 mg Oral Daily  . insulin aspart  0-9 Units Subcutaneous TID WC  . lanthanum  1,000 mg Oral BID WC  . pantoprazole (PROTONIX) IV  40 mg Intravenous QHS  . sodium chloride  3 mL Intravenous Q12H   Continuous Infusions:  PRN Meds:.sodium chloride, acetaminophen **OR** acetaminophen, ondansetron **OR** ondansetron (ZOFRAN) IV, sodium chloride  CBC  Recent Labs Lab 04/18/15 1404  WBC 8.0  NEUTROABS 5.1  HGB 7.1*  HCT 22.5*  MCV 99.1  PLT [id number]   Basic Metabolic Panel  Recent Labs Lab 04/18/15 1404  NA 142  K 4.3  CL 102  CO2 27  GLUCOSE 227*  BUN 64*   CREATININE 12.39*  CALCIUM 8.7*    Physical Exam  Blood pressure 125/60, pulse 74, temperature 98.5 F (36.9 C), temperature source Oral, resp. rate 18, weight 110.2 kg (242 lb 15.2 oz), SpO2 98 %. GEN: NAD ENT: NCAT EYES: EOMI CV: RRR, no rub PULM: CTAB ABD: s/nt/nd SKIN: no rashes/lesions EXT:No LEE AVF: RUE +B/T   A/P 42M ESRD THS [place] [facility] AVF w/ acute anemia, ?GIB 1. ESRD:  1. Keep on schedule HD, today 2. No heparin, doesn't rec as outpt either 3. Use AVF, chronic low AF, referred for Fgram as outpt 2. HTN/Vol:  1. Good IDWG as outpt, leaving 0.5-1kg under, lower EDW to 107.5kg 2. BP stable cont CCB and BB 3. Anemia and ? GIB after esopageal dilation 7/7 1. On ESA, due again 7/26 2. Rec 1u PRBC overnight 3. Repeat Hb with HD and transfuse 1u PRBC 4. GI to see today 4. MBD: well controlled as outpt, cont outpt meds   Pearson Grippe MD 04/19/2015, 10:43 AM

## 2015-04-19 NOTE — Anesthesia Postprocedure Evaluation (Signed)
Anesthesia Post Note  Patient: [name]  Procedure(s) Performed: Procedure(s) (LRB): ESOPHAGOGASTRODUODENOSCOPY (EGD) (N/A)  Anesthesia type: general  Patient location: PACU  Post pain: Pain level controlled  Post assessment: Patient's Cardiovascular Status Stable  Last Vitals:  Filed Vitals:   04/19/15 1357  BP: 164/79  Pulse: 70  Temp:   Resp: 13    Post vital signs: Reviewed and stable  Level of consciousness: sedated  Complications: No apparent anesthesia complications

## 2015-04-19 NOTE — Progress Notes (Signed)
Visual safety check done on patient's home CPAP unit.  Unit appears clean, without obvious cracks, damage or frayed/exposed wires.

## 2015-04-19 NOTE — Progress Notes (Signed)
UR completed.    Magie Ciampa W. Djuan Talton, RN, BSN  Trauma/Neuro ICU Case Manager [phone] 

## 2015-04-19 NOTE — Progress Notes (Signed)
    [facility] PROGRESS NOTE  [name] [id number] DOB: 09-05-1951 DOA: 04/18/2015 PCP: [name], MD  Assessment/Plan: Melena -Plan for EGD today. -Had recent EGD with esophageal dilatation. -GI to see today. -PPI BID IV for now.  ABLA on top of AOCD -To receive 1 units of PRBCs today in HD for a Hb of 7.1. -Recheck CBC post-transfusion.  ESRD -On HD TTS. -Appreciate renal input and recommendations.  DM II -Well controlled. -Continue current management.  Code Status: Full Code Family Communication: Patient only  Disposition Plan: For EGD today   Consultants:  Renal  GI   Antibiotics:  None   Subjective: No complaints other than being hungry.  Objective: Filed Vitals:   04/18/15 2030 04/19/15 0201 04/19/15 0540 04/19/15 0756  BP: 116/72  139/67 125/60  Pulse: 78 76 78 74  Temp: 97.7 F (36.5 C)  98 F (36.7 C) 98.5 F (36.9 C)  TempSrc: Oral  Oral Oral  Resp: 20 18 18 18   Weight: 110.2 kg (242 lb 15.2 oz)     SpO2: 95% 95% 96% 98%    Intake/Output Summary (Last 24 hours) at 04/19/15 1135 Last data filed at 04/19/15 0939  Gross per 24 hour  Intake      0 ml  Output      0 ml  Net      0 ml   Filed Weights   04/18/15 2030  Weight: 110.2 kg (242 lb 15.2 oz)    Exam:   General:  AA Ox3  Cardiovascular: RRR  Respiratory: CTA B  Abdomen: S/NT/ND/+BS  Extremities: trace bilateral edema   Neurologic:  Non-focal  Data Reviewed: Basic Metabolic Panel:  Recent Labs Lab 04/18/15 1404  NA 142  K 4.3  CL 102  CO2 27  GLUCOSE 227*  BUN 64*  CREATININE 12.39*  CALCIUM 8.7*   Liver Function Tests: No results for input(s): AST, ALT, ALKPHOS, BILITOT, PROT, ALBUMIN in the last 168 hours. No results for input(s): LIPASE, AMYLASE in the last 168 hours. No results for input(s): AMMONIA in the last 168 hours. CBC:  Recent Labs Lab 04/18/15 1404  WBC 8.0  NEUTROABS 5.1  HGB 7.1*  HCT 22.5*  MCV 99.1  PLT 301    Cardiac Enzymes:  Recent Labs Lab 04/18/15 1404  TROPONINI <0.03   BNP (last 3 results) No results for input(s): BNP in the last 8760 hours.  ProBNP (last 3 results) No results for input(s): PROBNP in the last 8760 hours.  CBG:  Recent Labs Lab 04/18/15 1835 04/18/15 2028 04/19/15 0755  GLUCAP 89 100* 84    No results found for this or any previous visit (from the past 240 hour(s)).   Studies: No results found.  Scheduled Meds: . ezetimibe  10 mg Oral Daily  . insulin aspart  0-9 Units Subcutaneous TID WC  . lanthanum  1,000 mg Oral BID WC  . pantoprazole (PROTONIX) IV  40 mg Intravenous QHS  . sodium chloride  3 mL Intravenous Q12H   Continuous Infusions:   Active Problems:   Anemia   Acute blood loss anemia   Dialysis patient    Time spent: 35 minutes. Greater than 50% of this time was spent in direct contact with the patient coordinating care.    Lelon Frohlich  [facility] Pager [phone]  If 7PM-7AM, please contact night-coverage at [email or web address], password [facility] 04/19/2015, 11:35 AM  LOS: 1 day

## 2015-04-19 NOTE — Transfer of Care (Signed)
Immediate Anesthesia Transfer of Care Note  Patient: [name]  Procedure(s) Performed: Procedure(s): ESOPHAGOGASTRODUODENOSCOPY (EGD) (N/A)  Patient Location: PACU and Endoscopy Unit  Anesthesia Type:MAC  Level of Consciousness: awake and patient cooperative  Airway & Oxygen Therapy: Patient Spontanous Breathing and Patient connected to nasal cannula oxygen  Post-op Assessment: Report given to RN, Post -op Vital signs reviewed and stable and Patient moving all extremities X 4  Post vital signs: Reviewed and stable  Last Vitals:  Filed Vitals:   04/19/15 1207  BP: 209/109  Pulse: 71  Temp: 36.9 C  Resp: 16    Complications: No apparent anesthesia complications

## 2015-04-19 NOTE — H&P (View-Only) (Signed)
    [facility] PROGRESS NOTE  [name] [id number] DOB: Apr 04, 1951 DOA: 04/18/2015 PCP: [name], MD  Assessment/Plan: Melena -Plan for EGD today. -Had recent EGD with esophageal dilatation. -GI to see today. -PPI BID IV for now.  ABLA on top of AOCD -To receive 1 units of PRBCs today in HD for a Hb of 7.1. -Recheck CBC post-transfusion.  ESRD -On HD TTS. -Appreciate renal input and recommendations.  DM II -Well controlled. -Continue current management.  Code Status: Full Code Family Communication: Patient only  Disposition Plan: For EGD today   Consultants:  Renal  GI   Antibiotics:  None   Subjective: No complaints other than being hungry.  Objective: Filed Vitals:   04/18/15 2030 04/19/15 0201 04/19/15 0540 04/19/15 0756  BP: 116/72  139/67 125/60  Pulse: 78 76 78 74  Temp: 97.7 F (36.5 C)  98 F (36.7 C) 98.5 F (36.9 C)  TempSrc: Oral  Oral Oral  Resp: 20 18 18 18   Weight: 110.2 kg (242 lb 15.2 oz)     SpO2: 95% 95% 96% 98%    Intake/Output Summary (Last 24 hours) at 04/19/15 1135 Last data filed at 04/19/15 0939  Gross per 24 hour  Intake      0 ml  Output      0 ml  Net      0 ml   Filed Weights   04/18/15 2030  Weight: 110.2 kg (242 lb 15.2 oz)    Exam:   General:  AA Ox3  Cardiovascular: RRR  Respiratory: CTA B  Abdomen: S/NT/ND/+BS  Extremities: trace bilateral edema   Neurologic:  Non-focal  Data Reviewed: Basic Metabolic Panel:  Recent Labs Lab 04/18/15 1404  NA 142  K 4.3  CL 102  CO2 27  GLUCOSE 227*  BUN 64*  CREATININE 12.39*  CALCIUM 8.7*   Liver Function Tests: No results for input(s): AST, ALT, ALKPHOS, BILITOT, PROT, ALBUMIN in the last 168 hours. No results for input(s): LIPASE, AMYLASE in the last 168 hours. No results for input(s): AMMONIA in the last 168 hours. CBC:  Recent Labs Lab 04/18/15 1404  WBC 8.0  NEUTROABS 5.1  HGB 7.1*  HCT 22.5*  MCV 99.1  PLT 301    Cardiac Enzymes:  Recent Labs Lab 04/18/15 1404  TROPONINI <0.03   BNP (last 3 results) No results for input(s): BNP in the last 8760 hours.  ProBNP (last 3 results) No results for input(s): PROBNP in the last 8760 hours.  CBG:  Recent Labs Lab 04/18/15 1835 04/18/15 2028 04/19/15 0755  GLUCAP 89 100* 84    No results found for this or any previous visit (from the past 240 hour(s)).   Studies: No results found.  Scheduled Meds: . ezetimibe  10 mg Oral Daily  . insulin aspart  0-9 Units Subcutaneous TID WC  . lanthanum  1,000 mg Oral BID WC  . pantoprazole (PROTONIX) IV  40 mg Intravenous QHS  . sodium chloride  3 mL Intravenous Q12H   Continuous Infusions:   Active Problems:   Anemia   Acute blood loss anemia   Dialysis patient    Time spent: 35 minutes. Greater than 50% of this time was spent in direct contact with the patient coordinating care.    Lelon Frohlich  [facility] Pager [phone]  If 7PM-7AM, please contact night-coverage at [email or web address], password [facility] 04/19/2015, 11:35 AM  LOS: 1 day

## 2015-04-19 NOTE — Anesthesia Preprocedure Evaluation (Addendum)
Anesthesia Evaluation  Patient identified by MRN, date of birth, ID band Patient awake    Reviewed: Allergy & Precautions, NPO status , Patient's Chart, lab work & pertinent test results  Airway Mallampati: II  TM Distance: >3 FB Neck ROM: Full    Dental  (+) Missing, Dental Advisory Given   Pulmonary sleep apnea and Continuous Positive Airway Pressure Ventilation , former smoker,    Pulmonary exam normal       Cardiovascular hypertension, Pt. on medications and Pt. on home beta blockers + CAD, + Past MI and +CHF Normal cardiovascular exam    Neuro/Psych    GI/Hepatic   Endo/Other  diabetes, Well Controlled, Type 2, Oral Hypoglycemic Agents  Renal/GU DialysisRenal disease     Musculoskeletal   Abdominal   Peds  Hematology   Anesthesia Other Findings   Reproductive/Obstetrics                            Anesthesia Physical Anesthesia Plan  ASA: III  Anesthesia Plan: MAC   Post-op Pain Management:    Induction: Intravenous  Airway Management Planned: Nasal Cannula  Additional Equipment: None  Intra-op Plan:   Post-operative Plan: Extubation in OR  Informed Consent: I have reviewed the patients History and Physical, chart, labs and discussed the procedure including the risks, benefits and alternatives for the proposed anesthesia with the patient or authorized representative who has indicated his/her understanding and acceptance.   Dental advisory given  Plan Discussed with: CRNA and Surgeon  Anesthesia Plan Comments:        Anesthesia Quick Evaluation

## 2015-04-19 NOTE — Op Note (Signed)
[facility] [place], [id number]   ENDOSCOPY PROCEDURE REPORT  PATIENT: [name], [name]  MR#: [id number] BIRTHDATE: 1951-04-19 , 35  yrs. old GENDER: male ENDOSCOPIST: Arta Silence, MD REFERRED BY:  [facility] PROCEDURE DATE:  05-09-15 PROCEDURE:  EGD, diagnostic ASA CLASS:     Class III INDICATIONS:  melena, anemia. MEDICATIONS: Monitored anesthesia care TOPICAL ANESTHETIC:  DESCRIPTION OF PROCEDURE: After the risks benefits and alternatives of the procedure were thoroughly explained, informed consent was obtained.  The Pentax Gastroscope [id number] endoscope was introduced through the mouth and advanced to the second portion of the duodenum. The instrument was slowly withdrawn as the mucosa was fully examined. Estimated blood loss is zero unless otherwise noted in this procedure report.    Findings:  Stenosis at GE junction with erythema and focal clean-based ulcer of modest depth without active bleeding or adherent clot or visible vessel.  Some chronic pyloric channel deformity, otherwise normal stomach, including normal retroflexion into the cardia.  Normal duodenum to the second portion.  No old or fresh blood seen to extent of our examination.              The scope was then withdrawn from the patient and the procedure completed.  COMPLICATIONS: There were no immediate complications.  ENDOSCOPIC IMPRESSION:     As above.  Some of more recent melena, which occurred soon after his recent endoscopic esophageal dilatation, might very well be from post-dilatation reparative changes and ulceration.  No ongoing bleeding and no stigmata from esophageal ulcer to suggest high risk for rebleeding.  RECOMMENDATIONS:     1.  Watch for potential complications of procedure. 2.  PPI. 3.  No NSAIDs. 4.  Advance diet. 5.  Oral/parenteral iron, under guidance of Nephrology. 6.  No further GI tract testing anticipated during this  admission. 7.  Will follow; hopefully patient can be discharged home within the next day or two.  eSigned:  Arta Silence, MD 05/09/15 1:40 PM   CC:  CPT CODES: ICD CODES:  The ICD and CPT codes recommended by this software are interpretations from the data that the clinical staff has captured with the software.  The verification of the translation of this report to the ICD and CPT codes and modifiers is the sole responsibility of the health care institution and practicing physician where this report was generated.  [employer] will not be held responsible for the validity of the ICD and CPT codes included on this report.  AMA assumes no liability for data contained or not contained herein. CPT is a Designer, television/film set of the [employer].

## 2015-04-19 NOTE — Interval H&P Note (Signed)
History and Physical Interval Note:  04/19/2015 1:12 PM  [name]  has presented today for surgery, with the diagnosis of anemia  The various methods of treatment have been discussed with the patient and family. After consideration of risks, benefits and other options for treatment, the patient has consented to  Procedure(s): ESOPHAGOGASTRODUODENOSCOPY (EGD) (N/A) as a surgical intervention .  The patient's history has been reviewed, patient examined, no change in status, stable for surgery.  I have reviewed the patient's chart and labs.  Questions were answered to the patient's satisfaction.     Dia Donate M  Assessment:  1.  Anemia. 2.  Melena.  Plan:  1.  Endoscopy. 2.  Risks (bleeding, infection, bowel perforation that could require surgery, sedation-related changes in cardiopulmonary systems), benefits (identification and possible treatment of source of symptoms, exclusion of certain causes of symptoms), and alternatives (watchful waiting, radiographic imaging studies, empiric medical treatment) of upper endoscopy (EGD) were explained to patient/family in detail and patient wishes to proceed.

## 2015-04-19 NOTE — Anesthesia Procedure Notes (Signed)
Procedure Name: MAC Date/Time: 04/19/2015 1:10 PM Performed by: Melina Copa, Debbie Yearick R Pre-anesthesia Checklist: Patient identified, Emergency Drugs available, Suction available, Patient being monitored and Timeout performed Patient Re-evaluated:Patient Re-evaluated prior to inductionOxygen Delivery Method: Nasal cannula Intubation Type: IV induction Dental Injury: Teeth and Oropharynx as per pre-operative assessment

## 2015-04-20 DIAGNOSIS — D62 Acute posthemorrhagic anemia: Principal | ICD-10-CM

## 2015-04-20 LAB — TYPE AND SCREEN
ABO/RH(D): O POS
ANTIBODY SCREEN: NEGATIVE
Unit division: 0

## 2015-04-20 LAB — CBC
HCT: 27 % — ABNORMAL LOW (ref 39.0–52.0)
Hemoglobin: 8.7 g/dL — ABNORMAL LOW (ref 13.0–17.0)
MCH: 30.6 pg (ref 26.0–34.0)
MCHC: 32.2 g/dL (ref 30.0–36.0)
MCV: 95.1 fL (ref 78.0–100.0)
Platelets: 246 10*3/uL (ref 150–400)
RBC: 2.84 MIL/uL — ABNORMAL LOW (ref 4.22–5.81)
RDW: 22.7 % — ABNORMAL HIGH (ref 11.5–15.5)
WBC: 6.1 10*3/uL (ref 4.0–10.5)

## 2015-04-20 LAB — GLUCOSE, CAPILLARY
GLUCOSE-CAPILLARY: 130 mg/dL — AB (ref 65–99)
GLUCOSE-CAPILLARY: 169 mg/dL — AB (ref 65–99)
GLUCOSE-CAPILLARY: 141 mg/dL — AB (ref 65–99)
Glucose-Capillary: 96 mg/dL (ref 65–99)

## 2015-04-20 MED ORDER — PANTOPRAZOLE SODIUM 40 MG PO TBEC: 40.0000 mg | DELAYED_RELEASE_TABLET | Freq: Two times a day (BID) | ORAL | Status: DC

## 2015-04-20 NOTE — Progress Notes (Signed)
°  [facility] Progress Note  Assessment/Plan: 1.  Anemia/Melena: Hgb 7.7 Endoscopy per Dr. Paulita Fujita yesterday. Stenosis at GE junction with erythema. No active bleeding.  2.  ESRD:  Hemodialysis TTS [employer]. No heparin. HD orders for tomorrow if not DC'd/  3. Hypertension/Volume: HD yesterday, Net UF 2050. Post wt 107.6 OP EDW 108.5.  BP controlled. Lower EDW to 107.5 kg 4. Mineral/Bone Disease: Ca 9.2 C Ca 9.7 K+5.3 prior to HD yesterday. Cont binders. 5. Nutrition: Albumin 3.4 Change to renal diet if OK with GI. Add renal Vit.  6. DM: Per primary-SS insulin.   Rita H. Brown NP-C 04/20/2015, 8:30 AM  [facility] [phone]  Subjective:  Sleeping with Bipap. "I'm all right". No C/O Abdominal pain.   Objective Filed Vitals:   04/20/15 0230 04/20/15 0300 04/20/15 0315 04/20/15 0320  BP: 131/83 131/70 132/75 141/69  Pulse: 74 77 75 77  Temp:   98.3 F (36.8 C)   TempSrc:   Oral   Resp: 16 14 14 16   Weight:   107.6 kg (237 lb 3.4 oz)   SpO2:    99%   Physical Exam General: Well nourished,  NAD Heart:S1,S2, RRR Lungs:Bilateral breath sounds symmetrical chest excursion CTA Abdomen: soft, non-distended, non-tender. Active BS. No tarry stools. Extremities:No LE edema. Pulses intact. Dialysis Access: RUA AVF weak thrill + Bruit  Dialysis Orders: [facility] TTS No Heparin. 4 hours  EDW 108.5 BRF 450 DFR 800  2.0 K 2.25 Ca 1 Mg Linear Sodium UF profile 4  Mircera 75 mcg (last dose 04/16/15)  Hectoral 2 mcg IV TTS  Additional Objective Labs: Basic Metabolic Panel:  Recent Labs Lab 04/18/15 1404 04/19/15 1106  NA 142 143  K 4.3 5.3*  CL 102 104  CO2 27 27  GLUCOSE 227* 90  BUN 64* 70*  CREATININE 12.39* 14.92*  CALCIUM 8.7* 9.2   Liver Function Tests:  Recent Labs Lab 04/19/15 1106  AST 20  ALT 24  ALKPHOS 91  BILITOT 0.8  PROT 6.7  ALBUMIN 3.4*   No results for input(s): LIPASE, AMYLASE in the last 168 hours. CBC:  Recent  Labs Lab 04/18/15 1404 04/19/15 1106  WBC 8.0 8.1  NEUTROABS 5.1  --   HGB 7.1* 7.7*  HCT 22.5* 24.6*  MCV 99.1 96.1  PLT 301 325   Blood Culture    Component Value Date/Time   SDES BLOOD RIGHT FOOT 09/27/2011 2109   SPECREQUEST BOTTLES DRAWN AEROBIC ONLY 5CC 09/27/2011 2109   CULT NO GROWTH 5 DAYS 09/27/2011 2109   REPTSTATUS 10/04/2011 FINAL 09/27/2011 2109    Cardiac Enzymes:  Recent Labs Lab 04/18/15 1404  TROPONINI <0.03   CBG:  Recent Labs Lab 04/19/15 0755 04/19/15 1126 04/19/15 1650 04/19/15 2124 04/20/15 0740  GLUCAP 84 89 164* 169* 96   Iron Studies: No results for input(s): IRON, TIBC, TRANSFERRIN, FERRITIN in the last 72 hours. @lablastinr3 @ Studies/Results: No results found. Medications:    ezetimibe  10 mg Oral Daily   insulin aspart  0-9 Units Subcutaneous TID WC   lanthanum  1,000 mg Oral BID WC   pantoprazole (PROTONIX) IV  40 mg Intravenous Q12H   sodium chloride  3 mL Intravenous Q12H

## 2015-04-20 NOTE — Discharge Summary (Signed)
Physician Discharge Summary  [name] [id number] DOB: 1951-09-03 DOA: 04/18/2015  PCP: [name], MD  Admit date: 04/18/2015 Discharge date: 04/20/2015  Recommendations for Outpatient Follow-up:  1. Pt will need to follow up with PCP in 2-3 weeks post discharge 2. Please also check CBC to evaluate Hg and Hct levels  Discharge Diagnoses:  Active Problems:   Anemia   Acute blood loss anemia   Dialysis patient   Melena  Discharge Condition: Stable  Diet recommendation: Heart healthy diet discussed in details   History of present illness:    Hospital Course:  Melena - per EGD may be the consequence of recent endoscopic dilatation - resolved at this time - no further interventions recommended by GI team  - continue PPI - stop all NSAID's  ABLA on top of AOCD - received one U of PRBC with last HD  - CBC in AM  ESRD - On HD TTS. - Appreciate renal input and recommendations.  DM II with complications of ESRD - Well controlled. - Continue current management.  Obesity  - Body mass index is 31.3 kg/(m^2).   Procedures/Studies:  EGD 7/19 by Dr. Paulita Fujita ENDOSCOPIC IMPRESSION:Some of more recent melena, which occurred soon after his recent endoscopic esophageal dilatation, might very well be from post-dilatation reparative changes and ulceration. No ongoing bleeding and no stigmata from esophageal ulcer to suggest high risk for rebleeding.  RECOMMENDATIONS:  1. Watch for potential complications ofprocedure. 2. PPI. 3. No NSAIDs. 4. Advance diet. 5. Oral/parenteral iron, under guidance of Nephrology. 6. No further GI tract testing anticipated during this admission.  Consultations:  GI  Nephrology   Antibiotics:  None  Discharge Exam: Filed Vitals:   04/20/15 0949  BP: 123/53  Pulse: 82  Temp: 98.2 F (36.8 C)  Resp: 18   Filed Vitals:   04/20/15 0300 04/20/15 0315 04/20/15 0320 04/20/15 0949  BP: 131/70 132/75 141/69 123/53   Pulse: 77 75 77 82  Temp:  98.3 F (36.8 C)  98.2 F (36.8 C)  TempSrc:  Oral  Oral  Resp: 14 14 16 18   Height:  6\' 1"  (1.854 m)    Weight:  107.6 kg (237 lb 3.4 oz)    SpO2:   99% 98%    General: Pt is alert, follows commands appropriately, not in acute distress Cardiovascular: Regular rate and rhythm, no rubs, no gallops Respiratory: Clear to auscultation bilaterally, no wheezing, no crackles, no rhonchi Abdominal: Soft, non tender, non distended, bowel sounds +, no guarding  Discharge Instructions  Discharge Instructions    Diet - low sodium heart healthy    Complete by:  As directed      Increase activity slowly    Complete by:  As directed             Medication List    STOP taking these medications        aspirin 81 MG EC tablet     ranitidine 300 MG tablet  Commonly known as:  ZANTAC      TAKE these medications        allopurinol 100 MG tablet  Commonly known as:  ZYLOPRIM  Take 1 tablet (100 mg total) by mouth daily.     amLODipine 10 MG tablet  Commonly known as:  NORVASC  Take 10 mg by mouth daily.     calcium-vitamin D 500-200 MG-UNIT per tablet  Commonly known as:  OSCAL WITH D  Take 3 tablets by mouth 3 (three) times  daily with meals.     carvedilol 25 MG tablet  Commonly known as:  COREG  Take 0.5 tablets (12.5 mg total) by mouth 2 (two) times daily with a meal. Only on non-HD days     cinacalcet 60 MG tablet  Commonly known as:  SENSIPAR  Take 60 mg by mouth 2 (two) times daily.     colesevelam 625 MG tablet  Commonly known as:  WELCHOL  Take 3 tablets (1,875 mg total) by mouth 2 (two) times daily with a meal.     ezetimibe 10 MG tablet  Commonly known as:  ZETIA  Take 1 tablet (10 mg total) by mouth daily.     gabapentin 300 MG capsule  Commonly known as:  NEURONTIN  TAKE ONE CAPSULE BY MOUTH AT BEDTIME AS NEEDED FOR PAIN     lanthanum 1000 MG chewable tablet  Commonly known as:  FOSRENOL  Chew 1,000 mg by mouth 2 times daily  at 12 noon and 4 pm.     multivitamin Tabs tablet  Take 1 tablet by mouth daily.     nateglinide 60 MG tablet  Commonly known as:  STARLIX  Take 1 tablet (60 mg total) by mouth 3 (three) times daily with meals.     pantoprazole 40 MG tablet  Commonly known as:  PROTONIX  Take 1 tablet (40 mg total) by mouth 2 (two) times daily.     pregabalin 100 MG capsule  Commonly known as:  LYRICA  Take 100 mg by mouth daily as needed (nerve pain).            Follow-up Information    Follow up with [name], MD.   Specialties:  Internal Medicine, Radiology   Contact information:   [place] [address] [phone]       Call Faye Ramsay, MD.   Specialty:  Internal Medicine   Why:  As needed, call my cell phone [phone]   Contact information:   [address] [place] [address] [phone]        The results of significant diagnostics from this hospitalization (including imaging, microbiology, ancillary and laboratory) are listed below for reference.     Microbiology: No results found for this or any previous visit (from the past 240 hour(s)).   Labs: Basic Metabolic Panel:  Recent Labs Lab 04/18/15 1404 04/19/15 1106  NA 142 143  K 4.3 5.3*  CL 102 104  CO2 27 27  GLUCOSE 227* 90  BUN 64* 70*  CREATININE 12.39* 14.92*  CALCIUM 8.7* 9.2   Liver Function Tests:  Recent Labs Lab 04/19/15 1106  AST 20  ALT 24  ALKPHOS 91  BILITOT 0.8  PROT 6.7  ALBUMIN 3.4*   CBC:  Recent Labs Lab 04/18/15 1404 04/19/15 1106 04/20/15 1300  WBC 8.0 8.1 6.1  NEUTROABS 5.1  --   --   HGB 7.1* 7.7* 8.7*  HCT 22.5* 24.6* 27.0*  MCV 99.1 96.1 95.1  PLT 301 325 246   Cardiac Enzymes:  Recent Labs Lab 04/18/15 1404  TROPONINI <0.03    CBG:  Recent Labs Lab 04/19/15 1126 04/19/15 1650 04/19/15 2124 04/20/15 0740 04/20/15 1158  GLUCAP 89 164* 169* 96 130*     SIGNED: Time coordinating  discharge: 30 minutes  MAGICK-Andrewjames Weirauch, MD  [facility] 04/20/2015, 2:16 PM Pager [phone]  If 7PM-7AM, please contact night-coverage [email or web address] Password [email or web address]

## 2015-04-20 NOTE — Care Management Important Message (Signed)
Important Message  Patient Details  Name: [name] MRN: [id number] Date of Birth: 22-Sep-1951   Medicare Important Message Given:  Yes-second notification given    Delorse Lek 04/20/2015, 1:09 PM

## 2015-04-20 NOTE — Progress Notes (Signed)
Subjective: No abdominal pain.  No noted dark stool overnight.  Objective: Vital signs in last 24 hours: Temp:  [97.5 F (36.4 C)-98.4 F (36.9 C)] 98.2 F (36.8 C) (07/20 0949) Pulse Rate:  [64-82] 82 (07/20 0949) Resp:  [13-20] 18 (07/20 0949) BP: (111-209)/(53-109) 123/53 mmHg (07/20 0949) SpO2:  [96 %-100 %] 98 % (07/20 0949) Weight:  [107.6 kg (237 lb 3.4 oz)-109.5 kg (241 lb 6.5 oz)] 107.6 kg (237 lb 3.4 oz) (07/20 0315) Weight change: -0.7 kg (-1 lb 8.7 oz) Last BM Date: 04/18/15  PE: GEN:  NAD, overweight, chronically ill-appearing ABD:  Soft  Lab Results: CBC    Component Value Date/Time   WBC 8.1 04/19/2015 1106   RBC 2.56* 04/19/2015 1106   HGB 7.7* 04/19/2015 1106   HCT 24.6* 04/19/2015 1106   PLT 325 04/19/2015 1106   MCV 96.1 04/19/2015 1106   MCH 30.1 04/19/2015 1106   MCHC 31.3 04/19/2015 1106   RDW 21.8* 04/19/2015 1106   LYMPHSABS 2.0 04/18/2015 1404   MONOABS 0.6 04/18/2015 1404   EOSABS 0.2 04/18/2015 1404   BASOSABS 0.1 04/18/2015 1404   CMP     Component Value Date/Time   NA 143 04/19/2015 1106   K 5.3* 04/19/2015 1106   CL 104 04/19/2015 1106   CO2 27 04/19/2015 1106   GLUCOSE 90 04/19/2015 1106   BUN 70* 04/19/2015 1106   CREATININE 14.92* 04/19/2015 1106   CALCIUM 9.2 04/19/2015 1106   PROT 6.7 04/19/2015 1106   ALBUMIN 3.4* 04/19/2015 1106   AST 20 04/19/2015 1106   ALT 24 04/19/2015 1106   ALKPHOS 91 04/19/2015 1106   BILITOT 0.8 04/19/2015 1106   GFRNONAA 3* 04/19/2015 1106   GFRAA 3* 04/19/2015 1106   Assessment:  1.  Melena, possibly from ulcer at GE junction, possibly sequela of endoscopic esophageal dilatation done couple weeks ago. 2.  Acute blood loss anemia, stable.  Plan:  1.  Advance diet as tolerated. 2.  PPI, now and upon discharge. 3.  No further GI tract evaluation anticipated; we will make arrangements for outpatient follow-up; will sign-off; please call with questions.   Landry Dyke 04/20/2015,  10:21 AM   Pager [phone] If no answer or after 5 PM call [phone]

## 2015-04-20 NOTE — Procedures (Signed)
Pt is not ready to be placed on CPAP at this time.  Pt will place self when ready.

## 2015-04-21 ENCOUNTER — Encounter (HOSPITAL_COMMUNITY): Payer: Self-pay | Admitting: Gastroenterology

## 2015-04-21 LAB — GLUCOSE, CAPILLARY: Glucose-Capillary: 143 mg/dL — ABNORMAL HIGH (ref 65–99)

## 2015-04-21 LAB — CBC
HEMATOCRIT: 26.3 % — AB (ref 39.0–52.0)
HEMOGLOBIN: 8.4 g/dL — AB (ref 13.0–17.0)
MCH: 29.7 pg (ref 26.0–34.0)
MCHC: 31.9 g/dL (ref 30.0–36.0)
MCV: 92.9 fL (ref 78.0–100.0)
Platelets: 263 10*3/uL (ref 150–400)
RBC: 2.83 MIL/uL — AB (ref 4.22–5.81)
RDW: 22.4 % — ABNORMAL HIGH (ref 11.5–15.5)
WBC: 6.3 10*3/uL (ref 4.0–10.5)

## 2015-04-21 LAB — RENAL FUNCTION PANEL
ALBUMIN: 3.2 g/dL — AB (ref 3.5–5.0)
Anion gap: 10 (ref 5–15)
BUN: 40 mg/dL — ABNORMAL HIGH (ref 6–20)
CALCIUM: 9.5 mg/dL (ref 8.9–10.3)
CO2: 28 mmol/L (ref 22–32)
Chloride: 101 mmol/L (ref 101–111)
Creatinine, Ser: 11.04 mg/dL — ABNORMAL HIGH (ref 0.61–1.24)
GFR calc non Af Amer: 4 mL/min — ABNORMAL LOW (ref >60)
GFR, EST AFRICAN AMERICAN: 5 mL/min — AB (ref >60)
Glucose, Bld: 139 mg/dL — ABNORMAL HIGH (ref 65–99)
PHOSPHORUS: 6 mg/dL — AB (ref 2.5–4.6)
Potassium: 4.6 mmol/L (ref 3.5–5.1)
SODIUM: 139 mmol/L (ref 135–145)

## 2015-04-21 LAB — TYPE AND SCREEN
ABO/RH(D): O POS
ANTIBODY SCREEN: NEGATIVE
Unit division: 0

## 2015-04-21 LAB — HEPATITIS B SURFACE ANTIGEN: Hepatitis B Surface Ag: NEGATIVE

## 2015-04-21 MED ORDER — GABAPENTIN 300 MG PO CAPS
300.0000 mg | ORAL_CAPSULE | Freq: Once | ORAL | Status: AC
  Administered 2015-04-21: 300 mg via ORAL
  Filled 2015-04-21: qty 1

## 2015-04-21 MED ORDER — NEPRO/CARBSTEADY PO LIQD: 237.0000 mL | ORAL | Status: DC | PRN

## 2015-04-21 MED ORDER — SODIUM CHLORIDE 0.9 % IV SOLN: 100.0000 mL | INTRAVENOUS | Status: DC | PRN

## 2015-04-21 MED ORDER — ALTEPLASE 2 MG IJ SOLR
2.0000 mg | Freq: Once | INTRAMUSCULAR | Status: DC | PRN
  Filled 2015-04-21: qty 2

## 2015-04-21 MED ORDER — GABAPENTIN 300 MG PO CAPS
300.0000 mg | ORAL_CAPSULE | Freq: Two times a day (BID) | ORAL | Status: DC
  Administered 2015-04-21: 300 mg via ORAL
  Filled 2015-04-21 (×2): qty 1

## 2015-04-21 MED ORDER — HEPARIN SODIUM (PORCINE) 1000 UNIT/ML DIALYSIS: 1000.0000 [IU] | INTRAMUSCULAR | Status: DC | PRN

## 2015-04-21 MED ORDER — LIDOCAINE HCL (PF) 1 % IJ SOLN: 5.0000 mL | INTRAMUSCULAR | Status: DC | PRN

## 2015-04-21 MED ORDER — PREGABALIN 100 MG PO CAPS
100.0000 mg | ORAL_CAPSULE | Freq: Every day | ORAL | Status: DC
  Administered 2015-04-21: 100 mg via ORAL
  Filled 2015-04-21: qty 1

## 2015-04-21 MED ORDER — LIDOCAINE-PRILOCAINE 2.5-2.5 % EX CREA: 1.0000 "application " | TOPICAL_CREAM | CUTANEOUS | Status: DC | PRN

## 2015-04-21 MED ORDER — PENTAFLUOROPROP-TETRAFLUOROETH EX AERO: 1.0000 "application " | INHALATION_SPRAY | CUTANEOUS | Status: DC | PRN

## 2015-04-21 NOTE — Procedures (Signed)
I was present at this dialysis session. I have reviewed the session itself and made appropriate changes.   C/o neuropathic pain in UEs.  Hands warm w/o ulcers.  Present on and off HD not suggestive of steal being primary issue.  On Lyrica.  Hb stable  Pearson Grippe  MD 04/21/2015, 8:35 AM

## 2015-04-21 NOTE — Progress Notes (Signed)
Pt seen in HD, stable at this time. Plan for d/c later after HD. See d/c summary 04/20/2015.  Faye Ramsay, MD  [facility] Pager [phone]  If 7PM-7AM, please contact night-coverage [email or web address] Password [email or web address]

## 2015-04-21 NOTE — Progress Notes (Signed)
Patient upset of not getting medication Lyrica and nuerontin for his neuropathy. Called NP to get medication re started. Dilaudid given as well.

## 2015-04-21 NOTE — Discharge Instructions (Signed)
Anemia, Nonspecific Anemia is a condition in which the concentration of red blood cells or hemoglobin in the blood is below normal. Hemoglobin is a substance in red blood cells that carries oxygen to the tissues of the body. Anemia results in not enough oxygen reaching these tissues.  CAUSES  Common causes of anemia include:   Excessive bleeding. Bleeding may be internal or external. This includes excessive bleeding from periods (in women) or from the intestine.   Poor nutrition.   Chronic kidney, thyroid, and liver disease.  Bone marrow disorders that decrease red blood cell production.  Cancer and treatments for cancer.  HIV, AIDS, and their treatments.  Spleen problems that increase red blood cell destruction.  Blood disorders.  Excess destruction of red blood cells due to infection, medicines, and autoimmune disorders. SIGNS AND SYMPTOMS   Minor weakness.   Dizziness.   Headache.  Palpitations.   Shortness of breath, especially with exercise.   Paleness.  Cold sensitivity.  Indigestion.  Nausea.  Difficulty sleeping.  Difficulty concentrating. Symptoms may occur suddenly or they may develop slowly.  DIAGNOSIS  Additional blood tests are often needed. These help your health care provider determine the best treatment. Your health care provider will check your stool for blood and look for other causes of blood loss.  TREATMENT  Treatment varies depending on the cause of the anemia. Treatment can include:   Supplements of iron, vitamin B12, or folic acid.   Hormone medicines.   A blood transfusion. This may be needed if blood loss is severe.   Hospitalization. This may be needed if there is significant continual blood loss.   Dietary changes.  Spleen removal. HOME CARE INSTRUCTIONS Keep all follow-up appointments. It often takes many weeks to correct anemia, and having your health care provider check on your condition and your response to  treatment is very important. SEEK IMMEDIATE MEDICAL CARE IF:   You develop extreme weakness, shortness of breath, or chest pain.   You become dizzy or have trouble concentrating.  You develop heavy vaginal bleeding.   You develop a rash.   You have bloody or black, tarry stools.   You faint.   You vomit up blood.   You vomit repeatedly.   You have abdominal pain.  You have a fever or persistent symptoms for more than 2-3 days.   You have a fever and your symptoms suddenly get worse.   You are dehydrated.  MAKE SURE YOU:  Understand these instructions.  Will watch your condition.  Will get help right away if you are not doing well or get worse. Document Released: 10/25/2004 Document Revised: 05/20/2013 Document Reviewed: 03/13/2013 Exit[employer] Patient Information 2015 [employer]. This information is not intended to replace advice given to you by your health care provider. Make sure you discuss any questions you have with your health care provider.  

## 2015-04-21 NOTE — Evaluation (Signed)
Physical Therapy Evaluation Patient Details Name: [name] MRN: [id number] DOB: [birth year] Today's Date: 04/21/2015   History of Present Illness  64 yo male with onset of GI bleed with endoscopic procedure, has received clearance for home  Clinical Impression  Pt was seen for establishing ability to walk toward going home.  Has some stiffness and pain in feet that might benefit from PT seeing in home setting, but is independent for this environment.  Has a good handle on his causes of increased neuropathy and will be managing with meds at home as usual.  Will not need further PT care in hospital.    Follow Up Recommendations Home health PT;Supervision - Intermittent    Equipment Recommendations  None recommended by PT    Recommendations for Other Services       Precautions / Restrictions Precautions Precautions: Fall Restrictions Weight Bearing Restrictions: No      Mobility  Bed Mobility Overal bed mobility: Modified Independent                Transfers Overall transfer level: Modified independent Equipment used: Rolling walker (2 wheeled)             General transfer comment: uses walker for inital standing support but can stand without it.  Ambulation/Gait Ambulation/Gait assistance: Supervision Ambulation Distance (Feet): 120 Feet Assistive device: Rolling walker (2 wheeled);1 person hand held assist Gait Pattern/deviations: Step-through pattern;Decreased step length - right;Decreased step length - left;Shuffle;Wide base of support Gait velocity: reduced Gait velocity interpretation: Below normal speed for age/gender    Stairs            Wheelchair Mobility    Modified Rankin (Stroke Patients Only)       Balance Overall balance assessment: Modified Independent                                           Pertinent Vitals/Pain Pain Assessment: 0-10 Pain Score: 8  Pain Location: R hand and B feet neuropathy Pain  Descriptors / Indicators: Pins and needles Pain Intervention(s): Limited activity within patient's tolerance;Monitored during session;Premedicated before session    Home Living Family/patient expects to be discharged to:: Private residence Living Arrangements: Spouse/significant other Available Help at Discharge: Family Type of Home: House       Home Layout: One level Home Equipment: Kasandra Knudsen - single point;Walker - 2 wheels      Prior Function Level of Independence: Independent with assistive device(s)               Hand Dominance        Extremity/Trunk Assessment   Upper Extremity Assessment: Overall WFL for tasks assessed           Lower Extremity Assessment: Overall WFL for tasks assessed      Cervical / Trunk Assessment: Normal  Communication   Communication: No difficulties  Cognition Arousal/Alertness: Awake/alert Behavior During Therapy: WFL for tasks assessed/performed Overall Cognitive Status: Within Functional Limits for tasks assessed                      General Comments General comments (skin integrity, edema, etc.): Pt can walk without AD but with RW could go farther and looks more controlled.      Exercises        Assessment/Plan    PT Assessment All further PT needs can be met in  the next venue of care  PT Diagnosis Difficulty walking   PT Problem List Decreased range of motion;Decreased activity tolerance;Decreased balance;Decreased mobility;Decreased coordination;Decreased safety awareness;Cardiopulmonary status limiting activity;Pain;Impaired sensation  PT Treatment Interventions     PT Goals (Current goals can be found in the Care Plan section) Acute Rehab PT Goals Patient Stated Goal: to get his pain under control for BLE's PT Goal Formulation: With patient Time For Goal Achievement: 05/05/15 Potential to Achieve Goals: Good    Frequency     Barriers to discharge        Co-evaluation               End of  Session   Activity Tolerance: No increased pain;Patient tolerated treatment well Patient left: in chair;with call bell/phone within reach Nurse Communication: Mobility status         Time: 1222-1236 PT Time Calculation (min) (ACUTE ONLY): 14 min   Charges:   PT Evaluation $Initial PT Evaluation Tier I: 1 Procedure     PT G CodesRamond Dial 20-May-2015, 1:23 PM   Mee Hives, PT MS Acute Rehab Dept. Number: [phone] [phone] and [place] [phone]

## 2015-04-21 NOTE — Procedures (Signed)
Pt has placed self on home CPAP machine and is resting well.

## 2015-04-22 ENCOUNTER — Ambulatory Visit: Payer: Medicare Other | Admitting: Internal Medicine

## 2015-04-23 DIAGNOSIS — D631 Anemia in chronic kidney disease: Secondary | ICD-10-CM | POA: Diagnosis not present

## 2015-04-23 DIAGNOSIS — E119 Type 2 diabetes mellitus without complications: Secondary | ICD-10-CM | POA: Diagnosis not present

## 2015-04-23 DIAGNOSIS — N2581 Secondary hyperparathyroidism of renal origin: Secondary | ICD-10-CM | POA: Diagnosis not present

## 2015-04-23 DIAGNOSIS — N186 End stage renal disease: Secondary | ICD-10-CM | POA: Diagnosis not present

## 2015-04-25 DIAGNOSIS — N186 End stage renal disease: Secondary | ICD-10-CM | POA: Diagnosis not present

## 2015-04-25 DIAGNOSIS — Z992 Dependence on renal dialysis: Secondary | ICD-10-CM | POA: Diagnosis not present

## 2015-04-25 DIAGNOSIS — I871 Compression of vein: Secondary | ICD-10-CM | POA: Diagnosis not present

## 2015-04-25 DIAGNOSIS — T82858D Stenosis of vascular prosthetic devices, implants and grafts, subsequent encounter: Secondary | ICD-10-CM | POA: Diagnosis not present

## 2015-04-26 ENCOUNTER — Inpatient Hospital Stay: Payer: Medicare Other | Admitting: Internal Medicine

## 2015-04-26 DIAGNOSIS — N186 End stage renal disease: Secondary | ICD-10-CM | POA: Diagnosis not present

## 2015-04-26 DIAGNOSIS — N2581 Secondary hyperparathyroidism of renal origin: Secondary | ICD-10-CM | POA: Diagnosis not present

## 2015-04-26 DIAGNOSIS — D631 Anemia in chronic kidney disease: Secondary | ICD-10-CM | POA: Diagnosis not present

## 2015-04-26 DIAGNOSIS — E119 Type 2 diabetes mellitus without complications: Secondary | ICD-10-CM | POA: Diagnosis not present

## 2015-04-27 ENCOUNTER — Inpatient Hospital Stay: Payer: Medicare Other | Admitting: Internal Medicine

## 2015-04-28 DIAGNOSIS — D631 Anemia in chronic kidney disease: Secondary | ICD-10-CM | POA: Diagnosis not present

## 2015-04-28 DIAGNOSIS — E119 Type 2 diabetes mellitus without complications: Secondary | ICD-10-CM | POA: Diagnosis not present

## 2015-04-28 DIAGNOSIS — N2581 Secondary hyperparathyroidism of renal origin: Secondary | ICD-10-CM | POA: Diagnosis not present

## 2015-04-28 DIAGNOSIS — N186 End stage renal disease: Secondary | ICD-10-CM | POA: Diagnosis not present

## 2015-04-29 DIAGNOSIS — D649 Anemia, unspecified: Secondary | ICD-10-CM | POA: Diagnosis not present

## 2015-04-29 DIAGNOSIS — R6881 Early satiety: Secondary | ICD-10-CM | POA: Diagnosis not present

## 2015-04-30 DIAGNOSIS — E119 Type 2 diabetes mellitus without complications: Secondary | ICD-10-CM | POA: Diagnosis not present

## 2015-04-30 DIAGNOSIS — N2581 Secondary hyperparathyroidism of renal origin: Secondary | ICD-10-CM | POA: Diagnosis not present

## 2015-04-30 DIAGNOSIS — D631 Anemia in chronic kidney disease: Secondary | ICD-10-CM | POA: Diagnosis not present

## 2015-04-30 DIAGNOSIS — N186 End stage renal disease: Secondary | ICD-10-CM | POA: Diagnosis not present

## 2015-05-01 DIAGNOSIS — Z992 Dependence on renal dialysis: Secondary | ICD-10-CM | POA: Diagnosis not present

## 2015-05-01 DIAGNOSIS — E1129 Type 2 diabetes mellitus with other diabetic kidney complication: Secondary | ICD-10-CM | POA: Diagnosis not present

## 2015-05-01 DIAGNOSIS — N186 End stage renal disease: Secondary | ICD-10-CM | POA: Diagnosis not present

## 2015-05-03 DIAGNOSIS — D631 Anemia in chronic kidney disease: Secondary | ICD-10-CM | POA: Diagnosis not present

## 2015-05-03 DIAGNOSIS — D509 Iron deficiency anemia, unspecified: Secondary | ICD-10-CM | POA: Diagnosis not present

## 2015-05-03 DIAGNOSIS — N186 End stage renal disease: Secondary | ICD-10-CM | POA: Diagnosis not present

## 2015-05-03 DIAGNOSIS — E119 Type 2 diabetes mellitus without complications: Secondary | ICD-10-CM | POA: Diagnosis not present

## 2015-05-03 DIAGNOSIS — N2581 Secondary hyperparathyroidism of renal origin: Secondary | ICD-10-CM | POA: Diagnosis not present

## 2015-05-05 DIAGNOSIS — D509 Iron deficiency anemia, unspecified: Secondary | ICD-10-CM | POA: Diagnosis not present

## 2015-05-05 DIAGNOSIS — N2581 Secondary hyperparathyroidism of renal origin: Secondary | ICD-10-CM | POA: Diagnosis not present

## 2015-05-05 DIAGNOSIS — N186 End stage renal disease: Secondary | ICD-10-CM | POA: Diagnosis not present

## 2015-05-05 DIAGNOSIS — D631 Anemia in chronic kidney disease: Secondary | ICD-10-CM | POA: Diagnosis not present

## 2015-05-05 DIAGNOSIS — E119 Type 2 diabetes mellitus without complications: Secondary | ICD-10-CM | POA: Diagnosis not present

## 2015-05-07 DIAGNOSIS — N2581 Secondary hyperparathyroidism of renal origin: Secondary | ICD-10-CM | POA: Diagnosis not present

## 2015-05-07 DIAGNOSIS — E119 Type 2 diabetes mellitus without complications: Secondary | ICD-10-CM | POA: Diagnosis not present

## 2015-05-07 DIAGNOSIS — D631 Anemia in chronic kidney disease: Secondary | ICD-10-CM | POA: Diagnosis not present

## 2015-05-07 DIAGNOSIS — D509 Iron deficiency anemia, unspecified: Secondary | ICD-10-CM | POA: Diagnosis not present

## 2015-05-07 DIAGNOSIS — N186 End stage renal disease: Secondary | ICD-10-CM | POA: Diagnosis not present

## 2015-05-09 ENCOUNTER — Encounter: Payer: Self-pay | Admitting: Adult Health

## 2015-05-09 ENCOUNTER — Ambulatory Visit (INDEPENDENT_AMBULATORY_CARE_PROVIDER_SITE_OTHER): Payer: Medicare Other | Admitting: Adult Health

## 2015-05-09 VITALS — BP 120/70 | HR 90 | Temp 98.5°F | Ht 73.0 in | Wt 242.0 lb

## 2015-05-09 DIAGNOSIS — G4733 Obstructive sleep apnea (adult) (pediatric): Secondary | ICD-10-CM

## 2015-05-09 DIAGNOSIS — I2510 Atherosclerotic heart disease of native coronary artery without angina pectoris: Secondary | ICD-10-CM

## 2015-05-09 NOTE — Patient Instructions (Signed)
Keep up good work  Wear CPAP At bedtime   Work on weight loss.  Do not drive if sleepy.  Follow up Dr. Halford Chessman  In 1 year and As needed

## 2015-05-09 NOTE — Progress Notes (Signed)
   Subjective:    Patient ID: [name], male    DOB: May 01, 1951, 64 y.o.   MRN: [id number]  HPI 64 yo male with known OSA on nocturnal CPAP  Seen for sleep consult 02/11/15 by Dr. Gwenette Greet   05/09/2015 Follow up : OSA  Pt returns for follow up for OSA He remains on CPAP At bedtime   Says he wears for around 7-8 hr each night Feels rested with no sign daytime sleepiness.  He has a new machine as his machine quit working couple of months ago.  Denies chest pain ,orthopnea, edema or feve.r  Download shows good compliance, with AHI at 5.5 , min leaks  On auto set  Avg usage 8.5hr each night .  Denies chest pain , orthopnea, edema, or feve.r    Review of Systems Constitutional:   No  weight loss, night sweats,  Fevers, chills, fatigue, or  lassitude.  HEENT:   No headaches,  Difficulty swallowing,  Tooth/dental problems, or  Sore throat,                No sneezing, itching, ear ache, nasal congestion, post nasal drip,   CV:  No chest pain,  Orthopnea, PND, swelling in lower extremities, anasarca, dizziness, palpitations, syncope.   GI  No heartburn, indigestion, abdominal pain, nausea, vomiting, diarrhea, change in bowel habits, loss of appetite, bloody stools.   Resp: No shortness of breath with exertion or at rest.  No excess mucus, no productive cough,  No non-productive cough,  No coughing up of blood.  No change in color of mucus.  No wheezing.  No chest wall deformity  Skin: no rash or lesions.  GU: no dysuria, change in color of urine, no urgency or frequency.  No flank pain, no hematuria   MS:  No joint pain or swelling.  No decreased range of motion.  No back pain.  Psych:  No change in mood or affect. No depression or anxiety.  No memory loss.         Objective:   Physical Exam  GEN: A/Ox3; pleasant , NAD, obese   HEENT:  [place]/AT,  EACs-clear, TMs-wnl, NOSE-clear, THROAT-clear, no lesions, no postnasal drip or exudate noted. Class 2-3 airway   NECK:  Supple w/  fair ROM; no JVD; normal carotid impulses w/o bruits; no thyromegaly or nodules palpated; no lymphadenopathy.  RESP  Clear  P & A; w/o, wheezes/ rales/ or rhonchi.no accessory muscle use, no dullness to percussion  CARD:  RRR, no m/r/g  , no peripheral edema, pulses intact, no cyanosis or clubbing.  GI:   Soft & nt; nml bowel sounds; no organomegaly or masses detected.  Musco: Warm bil, no deformities or joint swelling noted.   Neuro: alert, no focal deficits noted.    Skin: Warm, no lesions or rashes        Assessment & Plan:

## 2015-05-10 DIAGNOSIS — E119 Type 2 diabetes mellitus without complications: Secondary | ICD-10-CM | POA: Diagnosis not present

## 2015-05-10 DIAGNOSIS — D631 Anemia in chronic kidney disease: Secondary | ICD-10-CM | POA: Diagnosis not present

## 2015-05-10 DIAGNOSIS — D509 Iron deficiency anemia, unspecified: Secondary | ICD-10-CM | POA: Diagnosis not present

## 2015-05-10 DIAGNOSIS — N186 End stage renal disease: Secondary | ICD-10-CM | POA: Diagnosis not present

## 2015-05-10 DIAGNOSIS — N2581 Secondary hyperparathyroidism of renal origin: Secondary | ICD-10-CM | POA: Diagnosis not present

## 2015-05-10 NOTE — Assessment & Plan Note (Signed)
Doing well on CPAP   Plan  Keep up good work  Wear CPAP At bedtime   Work on weight loss.  Do not drive if sleepy.  Follow up Dr. Halford Chessman  In 1 year and As

## 2015-05-11 NOTE — Progress Notes (Signed)
Reviewed and agree with assessment/plan. 

## 2015-05-12 DIAGNOSIS — E119 Type 2 diabetes mellitus without complications: Secondary | ICD-10-CM | POA: Diagnosis not present

## 2015-05-12 DIAGNOSIS — D631 Anemia in chronic kidney disease: Secondary | ICD-10-CM | POA: Diagnosis not present

## 2015-05-12 DIAGNOSIS — N2581 Secondary hyperparathyroidism of renal origin: Secondary | ICD-10-CM | POA: Diagnosis not present

## 2015-05-12 DIAGNOSIS — N186 End stage renal disease: Secondary | ICD-10-CM | POA: Diagnosis not present

## 2015-05-12 DIAGNOSIS — D509 Iron deficiency anemia, unspecified: Secondary | ICD-10-CM | POA: Diagnosis not present

## 2015-05-14 DIAGNOSIS — D631 Anemia in chronic kidney disease: Secondary | ICD-10-CM | POA: Diagnosis not present

## 2015-05-14 DIAGNOSIS — N186 End stage renal disease: Secondary | ICD-10-CM | POA: Diagnosis not present

## 2015-05-14 DIAGNOSIS — N2581 Secondary hyperparathyroidism of renal origin: Secondary | ICD-10-CM | POA: Diagnosis not present

## 2015-05-14 DIAGNOSIS — D509 Iron deficiency anemia, unspecified: Secondary | ICD-10-CM | POA: Diagnosis not present

## 2015-05-14 DIAGNOSIS — E119 Type 2 diabetes mellitus without complications: Secondary | ICD-10-CM | POA: Diagnosis not present

## 2015-05-17 DIAGNOSIS — D509 Iron deficiency anemia, unspecified: Secondary | ICD-10-CM | POA: Diagnosis not present

## 2015-05-17 DIAGNOSIS — N186 End stage renal disease: Secondary | ICD-10-CM | POA: Diagnosis not present

## 2015-05-17 DIAGNOSIS — E119 Type 2 diabetes mellitus without complications: Secondary | ICD-10-CM | POA: Diagnosis not present

## 2015-05-17 DIAGNOSIS — D631 Anemia in chronic kidney disease: Secondary | ICD-10-CM | POA: Diagnosis not present

## 2015-05-17 DIAGNOSIS — N2581 Secondary hyperparathyroidism of renal origin: Secondary | ICD-10-CM | POA: Diagnosis not present

## 2015-05-19 DIAGNOSIS — D631 Anemia in chronic kidney disease: Secondary | ICD-10-CM | POA: Diagnosis not present

## 2015-05-19 DIAGNOSIS — N2581 Secondary hyperparathyroidism of renal origin: Secondary | ICD-10-CM | POA: Diagnosis not present

## 2015-05-19 DIAGNOSIS — D509 Iron deficiency anemia, unspecified: Secondary | ICD-10-CM | POA: Diagnosis not present

## 2015-05-19 DIAGNOSIS — N186 End stage renal disease: Secondary | ICD-10-CM | POA: Diagnosis not present

## 2015-05-19 DIAGNOSIS — E119 Type 2 diabetes mellitus without complications: Secondary | ICD-10-CM | POA: Diagnosis not present

## 2015-05-21 DIAGNOSIS — D509 Iron deficiency anemia, unspecified: Secondary | ICD-10-CM | POA: Diagnosis not present

## 2015-05-21 DIAGNOSIS — N186 End stage renal disease: Secondary | ICD-10-CM | POA: Diagnosis not present

## 2015-05-21 DIAGNOSIS — E119 Type 2 diabetes mellitus without complications: Secondary | ICD-10-CM | POA: Diagnosis not present

## 2015-05-21 DIAGNOSIS — D631 Anemia in chronic kidney disease: Secondary | ICD-10-CM | POA: Diagnosis not present

## 2015-05-21 DIAGNOSIS — N2581 Secondary hyperparathyroidism of renal origin: Secondary | ICD-10-CM | POA: Diagnosis not present

## 2015-05-24 DIAGNOSIS — N186 End stage renal disease: Secondary | ICD-10-CM | POA: Diagnosis not present

## 2015-05-24 DIAGNOSIS — E119 Type 2 diabetes mellitus without complications: Secondary | ICD-10-CM | POA: Diagnosis not present

## 2015-05-24 DIAGNOSIS — D631 Anemia in chronic kidney disease: Secondary | ICD-10-CM | POA: Diagnosis not present

## 2015-05-24 DIAGNOSIS — N2581 Secondary hyperparathyroidism of renal origin: Secondary | ICD-10-CM | POA: Diagnosis not present

## 2015-05-24 DIAGNOSIS — D509 Iron deficiency anemia, unspecified: Secondary | ICD-10-CM | POA: Diagnosis not present

## 2015-05-25 DIAGNOSIS — N138 Other obstructive and reflux uropathy: Secondary | ICD-10-CM | POA: Diagnosis not present

## 2015-05-25 DIAGNOSIS — R972 Elevated prostate specific antigen [PSA]: Secondary | ICD-10-CM | POA: Diagnosis not present

## 2015-05-25 DIAGNOSIS — R5383 Other fatigue: Secondary | ICD-10-CM | POA: Diagnosis not present

## 2015-05-25 DIAGNOSIS — N401 Enlarged prostate with lower urinary tract symptoms: Secondary | ICD-10-CM | POA: Diagnosis not present

## 2015-05-26 DIAGNOSIS — D631 Anemia in chronic kidney disease: Secondary | ICD-10-CM | POA: Diagnosis not present

## 2015-05-26 DIAGNOSIS — E119 Type 2 diabetes mellitus without complications: Secondary | ICD-10-CM | POA: Diagnosis not present

## 2015-05-26 DIAGNOSIS — N186 End stage renal disease: Secondary | ICD-10-CM | POA: Diagnosis not present

## 2015-05-26 DIAGNOSIS — D509 Iron deficiency anemia, unspecified: Secondary | ICD-10-CM | POA: Diagnosis not present

## 2015-05-26 DIAGNOSIS — N2581 Secondary hyperparathyroidism of renal origin: Secondary | ICD-10-CM | POA: Diagnosis not present

## 2015-05-27 DIAGNOSIS — R5383 Other fatigue: Secondary | ICD-10-CM | POA: Diagnosis not present

## 2015-05-27 DIAGNOSIS — R972 Elevated prostate specific antigen [PSA]: Secondary | ICD-10-CM | POA: Diagnosis not present

## 2015-05-28 DIAGNOSIS — E119 Type 2 diabetes mellitus without complications: Secondary | ICD-10-CM | POA: Diagnosis not present

## 2015-05-28 DIAGNOSIS — D509 Iron deficiency anemia, unspecified: Secondary | ICD-10-CM | POA: Diagnosis not present

## 2015-05-28 DIAGNOSIS — D631 Anemia in chronic kidney disease: Secondary | ICD-10-CM | POA: Diagnosis not present

## 2015-05-28 DIAGNOSIS — N2581 Secondary hyperparathyroidism of renal origin: Secondary | ICD-10-CM | POA: Diagnosis not present

## 2015-05-28 DIAGNOSIS — N186 End stage renal disease: Secondary | ICD-10-CM | POA: Diagnosis not present

## 2015-05-31 DIAGNOSIS — D631 Anemia in chronic kidney disease: Secondary | ICD-10-CM | POA: Diagnosis not present

## 2015-05-31 DIAGNOSIS — N2581 Secondary hyperparathyroidism of renal origin: Secondary | ICD-10-CM | POA: Diagnosis not present

## 2015-05-31 DIAGNOSIS — D509 Iron deficiency anemia, unspecified: Secondary | ICD-10-CM | POA: Diagnosis not present

## 2015-05-31 DIAGNOSIS — E119 Type 2 diabetes mellitus without complications: Secondary | ICD-10-CM | POA: Diagnosis not present

## 2015-05-31 DIAGNOSIS — N186 End stage renal disease: Secondary | ICD-10-CM | POA: Diagnosis not present

## 2015-06-01 DIAGNOSIS — Z992 Dependence on renal dialysis: Secondary | ICD-10-CM | POA: Diagnosis not present

## 2015-06-01 DIAGNOSIS — N186 End stage renal disease: Secondary | ICD-10-CM | POA: Diagnosis not present

## 2015-06-01 DIAGNOSIS — E1129 Type 2 diabetes mellitus with other diabetic kidney complication: Secondary | ICD-10-CM | POA: Diagnosis not present

## 2015-06-02 DIAGNOSIS — N186 End stage renal disease: Secondary | ICD-10-CM | POA: Diagnosis not present

## 2015-06-02 DIAGNOSIS — D509 Iron deficiency anemia, unspecified: Secondary | ICD-10-CM | POA: Diagnosis not present

## 2015-06-02 DIAGNOSIS — N2581 Secondary hyperparathyroidism of renal origin: Secondary | ICD-10-CM | POA: Diagnosis not present

## 2015-06-02 DIAGNOSIS — E119 Type 2 diabetes mellitus without complications: Secondary | ICD-10-CM | POA: Diagnosis not present

## 2015-06-02 DIAGNOSIS — D631 Anemia in chronic kidney disease: Secondary | ICD-10-CM | POA: Diagnosis not present

## 2015-06-04 DIAGNOSIS — N2581 Secondary hyperparathyroidism of renal origin: Secondary | ICD-10-CM | POA: Diagnosis not present

## 2015-06-04 DIAGNOSIS — D509 Iron deficiency anemia, unspecified: Secondary | ICD-10-CM | POA: Diagnosis not present

## 2015-06-04 DIAGNOSIS — E119 Type 2 diabetes mellitus without complications: Secondary | ICD-10-CM | POA: Diagnosis not present

## 2015-06-04 DIAGNOSIS — D631 Anemia in chronic kidney disease: Secondary | ICD-10-CM | POA: Diagnosis not present

## 2015-06-04 DIAGNOSIS — N186 End stage renal disease: Secondary | ICD-10-CM | POA: Diagnosis not present

## 2015-06-07 DIAGNOSIS — D509 Iron deficiency anemia, unspecified: Secondary | ICD-10-CM | POA: Diagnosis not present

## 2015-06-07 DIAGNOSIS — N186 End stage renal disease: Secondary | ICD-10-CM | POA: Diagnosis not present

## 2015-06-07 DIAGNOSIS — D631 Anemia in chronic kidney disease: Secondary | ICD-10-CM | POA: Diagnosis not present

## 2015-06-07 DIAGNOSIS — E119 Type 2 diabetes mellitus without complications: Secondary | ICD-10-CM | POA: Diagnosis not present

## 2015-06-07 DIAGNOSIS — N2581 Secondary hyperparathyroidism of renal origin: Secondary | ICD-10-CM | POA: Diagnosis not present

## 2015-06-08 ENCOUNTER — Ambulatory Visit (INDEPENDENT_AMBULATORY_CARE_PROVIDER_SITE_OTHER): Payer: Medicare Other | Admitting: Internal Medicine

## 2015-06-08 VITALS — BP 128/84 | HR 84 | Temp 98.8°F | Ht 73.0 in | Wt 240.0 lb

## 2015-06-08 DIAGNOSIS — I2510 Atherosclerotic heart disease of native coronary artery without angina pectoris: Secondary | ICD-10-CM

## 2015-06-08 DIAGNOSIS — I10 Essential (primary) hypertension: Secondary | ICD-10-CM | POA: Diagnosis not present

## 2015-06-08 DIAGNOSIS — R519 Headache, unspecified: Secondary | ICD-10-CM

## 2015-06-08 DIAGNOSIS — F0390 Unspecified dementia without behavioral disturbance: Secondary | ICD-10-CM

## 2015-06-08 DIAGNOSIS — R51 Headache: Secondary | ICD-10-CM

## 2015-06-08 MED ORDER — COLESEVELAM HCL 625 MG PO TABS: 1875.0000 mg | ORAL_TABLET | Freq: Two times a day (BID) | ORAL | Status: DC

## 2015-06-08 MED ORDER — GABAPENTIN 300 MG PO CAPS: 300.0000 mg | ORAL_CAPSULE | Freq: Every day | ORAL | Status: DC

## 2015-06-08 MED ORDER — ALLOPURINOL 100 MG PO TABS: 100.0000 mg | ORAL_TABLET | Freq: Every day | ORAL | Status: DC

## 2015-06-08 NOTE — Assessment & Plan Note (Signed)
?   Mild worsening, for neurology referral per pt,  to f/u any worsening symptoms or concerns

## 2015-06-08 NOTE — Patient Instructions (Signed)
Please continue all other medications as before, and refills have been done if requested.  Please have the pharmacy call with any other refills you may need.  Please continue your efforts at being more active, low cholesterol diet, and weight control.  You are otherwise up to date with prevention measures today.  Please keep your appointments with your specialists as you may have planned  You will be contacted regarding the referral for: neurology  Please return in 6 months, or sooner if needed

## 2015-06-08 NOTE — Assessment & Plan Note (Signed)
Exam benign, no neuro changes, will hold on imaging, pt requests part of neuro referral for this as well

## 2015-06-08 NOTE — Progress Notes (Signed)
Pre visit review using our clinic review tool, if applicable. No additional management support is needed unless otherwise documented below in the visit note. 

## 2015-06-08 NOTE — Progress Notes (Signed)
Subjective:    Patient ID: [name], male    DOB: 04-Oct-1950, 64 y.o.   MRN: [id number]  HPI  Here to f/u, recently hospd with anemia ACL with melena, after EGD, remains on PPI. S/p 1 u PRBC.  Hgb remains stable about 9, on iron and what sounds like aranesp.  Denies worsening reflux, abd pain, dysphagia, n/v, bowel change or blood.  Pt denies new neurological symptoms such as facial or extremity weakness or numbness, does have left sided tender area headache.  ? Triggers onset after po sweets.   Pt denies polydipsia, polyuria, or low sugar symptoms such as weakness or confusion improved with po intake. No falls, but is unsteady somewhat due to knees.   Pt states overall good compliance with meds, trying to follow lower cholesterol, diabetic diet, wt overall stable but little exercise however.   Tylenol eases the HA's somewhat.  Asks for neurology referral, also having memory worsening.  Has seen urology 2 wks ago as well, no changes per pt Past Medical History  Diagnosis Date  . Hyperthyroidism   . GASTROENTERITIS, VIRAL 10/14/2009  . ONYCHOMYCOSIS, TOENAILS 12/26/2007  . GOITER, MULTINODULAR 12/26/2007  . HYPERTHYROIDISM 02/02/2010  . DIABETES MELLITUS, TYPE II 02/01/2010  . DYSLIPIDEMIA 06/18/2007  . GOUT 06/18/2007  . Hypocalcemia 06/07/2010  . DEPRESSION 10/14/2009  . PERIPHERAL NEUROPATHY 06/18/2007  . HYPERTENSION 06/18/2007  . CONGESTIVE HEART FAILURE 06/18/2007  . DIASTOLIC HEART FAILURE, CHRONIC 02/06/2009  . Unspecified hypotension 01/30/2010  . PULMONARY NODULE, RIGHT LOWER LOBE 06/08/2009  . GERD 06/18/2007  . BENIGN PROSTATIC HYPERTROPHY 10/14/2009  . GYNECOMASTIA 07/17/2010  . NECK PAIN 07/31/2010  . FOOT PAIN 08/12/2008  . DIZZINESS 07/17/2010  . Other malaise and fatigue 11/24/2009  . GAIT DISTURBANCE 03/03/2010  . DYSPNEA 10/29/2008  . CHEST PAIN 03/29/2010  . TRANSAMINASES, SERUM, ELEVATED 02/01/2010  . COLONIC POLYPS, HX OF 10/14/2009  . Anemia 06/16/2011  . Ischemic cardiomyopathy  06/16/2011  . Hyperparathyroidism, secondary 06/16/2011  . OSA on CPAP 10/16/2011  . Hyperlipidemia 10/16/2011  . CAD, NATIVE VESSEL 02/06/2009    saw Dr. Missy Sabins last jan 2013  . Sleep apnea     cpap machine and o2  . Allergic rhinitis, cause unspecified 02/24/2014  . Transfusion history     none recent  . Hemodialysis access, fistula mature     Dialysis [facility]Th[facility] ([place]) Right upper arm fistula  . ESRD (end stage renal disease) on dialysis 08/04/2010    "TTS;  " (04/18/2015)  . Dementia [id number]   Past Surgical History  Procedure Laterality Date  . Coronary angioplasty with stent placement  06/11/2008  . Cervical spine surgery  2/09    "to repair nerve problems in my left arm"  . Back surgery  1998  . Esophagogastroduodenoscopy  09/28/2011    Procedure: ESOPHAGOGASTRODUODENOSCOPY (EGD);  Surgeon: Missy Sabins, MD;  Location[facility] ENDOSCOPY;  Service: Endoscopy;  Laterality: N/A;  . Givens capsule study  09/30/2011    Procedure: GIVENS CAPSULE STUDY;  Surgeon: Jeryl Columbia, MD;  Location: [facility] ENDOSCOPY;  Service: Endoscopy;  Laterality: N/A;  . Cholecystectomy    . Coronary angioplasty with stent placement  06/2007    TAXUS stent to RCA/notes 01/31/2011  . Tonsillectomy    . Av fistula placement  11/07/2011    Procedure: INSERTION OF ARTERIOVENOUS (AV) GORE-TEX GRAFT ARM;  Surgeon: Tinnie Gens, MD;  Location: [place];  Service: Vascular;  Laterality: Left;  . Arteriovenous graft placement  Right 2009    forearm/notes 02/01/2011  . Insertion of dialysis catheter Right 2014  . Insertion of dialysis catheter Left 02/11/2013    Procedure: INSERTION OF DIALYSIS CATHETER;  Surgeon: Conrad Long Lake, MD;  Location: [place];  Service: Vascular;  Laterality: Left;  Ultrasound guided  . Removal of a dialysis catheter Right 02/11/2013    Procedure: REMOVAL OF A DIALYSIS CATHETER;  Surgeon: Conrad Bell, MD;  Location: [place];  Service: Vascular;  Laterality: Right;  . Bascilic vein  transposition Right 02/27/2013    Procedure: BASCILIC VEIN TRANSPOSITION;  Surgeon: Mal Misty, MD;  Location: [place];  Service: Vascular;  Laterality: Right;  Right Basilic Vein Transposition   . Shuntogram N/A 09/20/2011    Procedure: Earney Mallet;  Surgeon: Conrad Mead, MD;  Location: [facility] CATH LAB;  Service: Cardiovascular;  Laterality: N/A;  . Venogram N/A 01/26/2013    Procedure: VENOGRAM;  Surgeon: Angelia Mould, MD;  Location: [facility] CATH LAB;  Service: Cardiovascular;  Laterality: N/A;  . Esophagogastroduodenoscopy N/A 04/07/2015    Procedure: ESOPHAGOGASTRODUODENOSCOPY (EGD);  Surgeon: Teena Irani, MD;  Location: [facility] ENDOSCOPY;  Service: Endoscopy;  Laterality: N/A;  . Savory dilation N/A 04/07/2015    Procedure: SAVORY DILATION;  Surgeon: Teena Irani, MD;  Location: [facility] ENDOSCOPY;  Service: Endoscopy;  Laterality: N/A;  . Foreign body removal  09/2003    via upper endoscopy/notes 02/12/2011  . Esophagogastroduodenoscopy N/A 04/19/2015    Procedure: ESOPHAGOGASTRODUODENOSCOPY (EGD);  Surgeon: Arta Silence, MD;  Location: [facility] ENDOSCOPY;  Service: Endoscopy;  Laterality: N/A;    reports that he has quit smoking. His smoking use included Cigarettes. He has a 25 pack-year smoking history. He quit smokeless tobacco use about 9 years ago. He reports that he drinks alcohol. He reports that he does not use illicit drugs. family history includes Heart disease in his father and sister. Allergies  Allergen Reactions  . Cephalexin Other (See Comments)    Tongue swelling  . Statins     Weak muscles   Current Outpatient Prescriptions on File Prior to Visit  Medication Sig Dispense Refill  . amLODipine (NORVASC) 10 MG tablet Take 10 mg by mouth daily.    . calcium-vitamin D (OSCAL WITH D) 500-200 MG-UNIT per tablet Take 3 tablets by mouth 3 (three) times daily with meals.     . carvedilol (COREG) 25 MG tablet Take 0.5 tablets (12.5 mg total) by mouth 2 (two) times daily with a meal. Only on non-HD  days    . cinacalcet (SENSIPAR) 60 MG tablet Take 60 mg by mouth 2 (two) times daily.     [name] DEXILANT 60 MG capsule Take 1 tablet by mouth daily as needed.    . ezetimibe (ZETIA) 10 MG tablet Take 1 tablet (10 mg total) by mouth daily. 30 tablet 6  . lanthanum (FOSRENOL) 1000 MG chewable tablet Chew 1,000 mg by mouth 2 times daily at 12 noon and 4 pm.    . multivitamin (RENA-VIT) TABS tablet Take 1 tablet by mouth daily.      . nateglinide (STARLIX) 60 MG tablet Take 1 tablet (60 mg total) by mouth 3 (three) times daily with meals. 90 tablet 11  . pregabalin (LYRICA) 100 MG capsule Take 100 mg by mouth daily as needed (nerve pain).     No current facility-administered medications on file prior to visit.     Review of Systems  Constitutional: Negative for unusual diaphoresis or night sweats HENT: Negative for ringing in  ear or discharge Eyes: Negative for double vision or worsening visual disturbance.  Respiratory: Negative for choking and stridor.   Gastrointestinal: Negative for vomiting or other signifcant bowel change Genitourinary: Negative for hematuria or change in urine volume.  Musculoskeletal: Negative for other MSK pain or swelling Skin: Negative for color change and worsening wound.  Neurological: Negative for tremors and numbness other than noted  Psychiatric/Behavioral: Negative for decreased concentration or agitation other than above       Objective:   Physical Exam BP 128/84 mmHg  Pulse 84  Temp(Src) 98.8 F (37.1 C) (Oral)  Ht 6\' 1"  (1.854 m)  Wt 240 lb (108.863 kg)  BMI 31.67 kg/m2  SpO2 95% VS noted,  Constitutional: Pt appears in no significant distress HENT: Head: NCAT.  Right Ear: External ear normal.  Left Ear: External ear normal.  Eyes: . Pupils are equal, round, and reactive to light. Conjunctivae and EOM are normal Neck: Normal range of motion. Neck supple.  Cardiovascular: Normal rate and regular rhythm.   Pulmonary/Chest: Effort normal and  breath sounds without rales or wheezing.  Abd:  Soft, NT, ND, + BS Neurological: Pt is alert. + baseline confused , motor grossly intact Skin: Skin is warm. No rash, no LE edema Psychiatric: Pt behavior is normal. No agitation.     Assessment & Plan:

## 2015-06-08 NOTE — Assessment & Plan Note (Signed)
stable overall by history and exam, recent data reviewed with pt, and pt to continue medical treatment as before,  to f/u any worsening symptoms or concerns BP Readings from Last 3 Encounters:  06/08/15 128/84  05/09/15 120/70  04/21/15 137/68

## 2015-06-09 DIAGNOSIS — N2581 Secondary hyperparathyroidism of renal origin: Secondary | ICD-10-CM | POA: Diagnosis not present

## 2015-06-09 DIAGNOSIS — E119 Type 2 diabetes mellitus without complications: Secondary | ICD-10-CM | POA: Diagnosis not present

## 2015-06-09 DIAGNOSIS — D631 Anemia in chronic kidney disease: Secondary | ICD-10-CM | POA: Diagnosis not present

## 2015-06-09 DIAGNOSIS — N186 End stage renal disease: Secondary | ICD-10-CM | POA: Diagnosis not present

## 2015-06-09 DIAGNOSIS — D509 Iron deficiency anemia, unspecified: Secondary | ICD-10-CM | POA: Diagnosis not present

## 2015-06-11 DIAGNOSIS — N186 End stage renal disease: Secondary | ICD-10-CM | POA: Diagnosis not present

## 2015-06-11 DIAGNOSIS — N2581 Secondary hyperparathyroidism of renal origin: Secondary | ICD-10-CM | POA: Diagnosis not present

## 2015-06-11 DIAGNOSIS — D509 Iron deficiency anemia, unspecified: Secondary | ICD-10-CM | POA: Diagnosis not present

## 2015-06-11 DIAGNOSIS — D631 Anemia in chronic kidney disease: Secondary | ICD-10-CM | POA: Diagnosis not present

## 2015-06-11 DIAGNOSIS — E119 Type 2 diabetes mellitus without complications: Secondary | ICD-10-CM | POA: Diagnosis not present

## 2015-06-11 IMAGING — RF DG ESOPHAGUS
5 of 6 series · 19 of 24 positions shown · non-contrast
Comparison: Swallowing function study 07/28/2014

CLINICAL DATA: Dysphasia. Barium tablet did not pass on recent
swallowing function study 07/28/2014

EXAM:
ESOPHOGRAM / BARIUM SWALLOW / BARIUM TABLET STUDY
TECHNIQUE: Combined double contrast and single contrast examination performed
using effervescent crystals, thick barium liquid, and thin barium
liquid. The patient was observed with fluoroscopy swallowing a 13mm
barium sulphate tablet.
FLUOROSCOPY TIME:  1 min 48 seconds

[Series 1: run · 15 of 23 slices shown (1 of 5)]
[im 1/23]
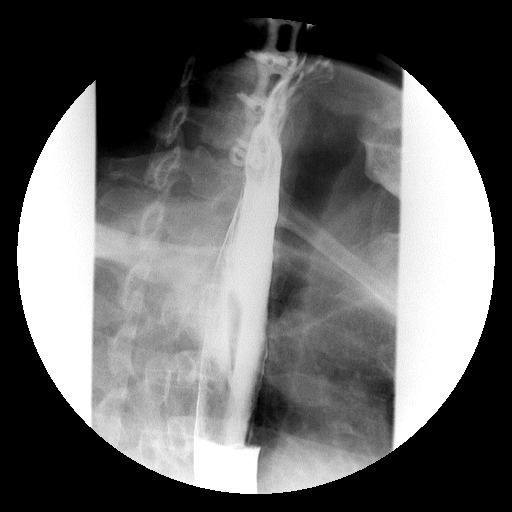
[im 2/23]
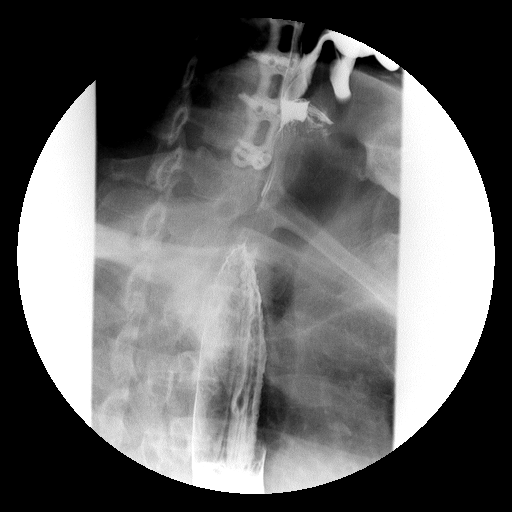
[im 4/23]
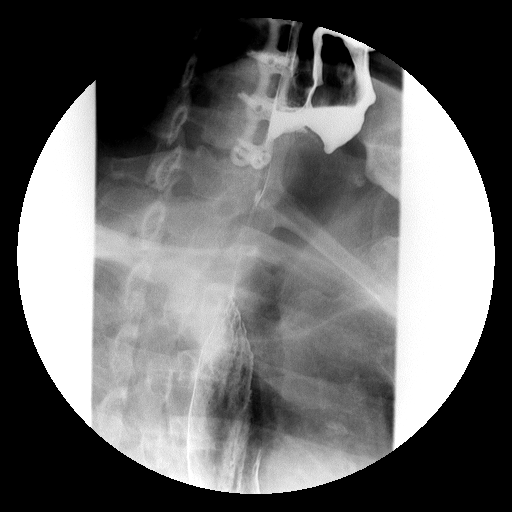
[im 5/23]
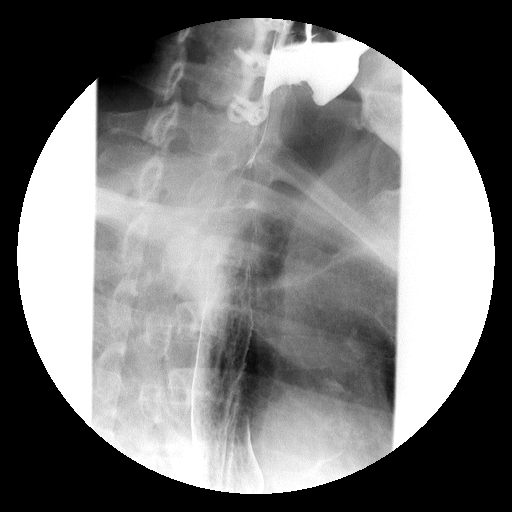
[im 7/23]
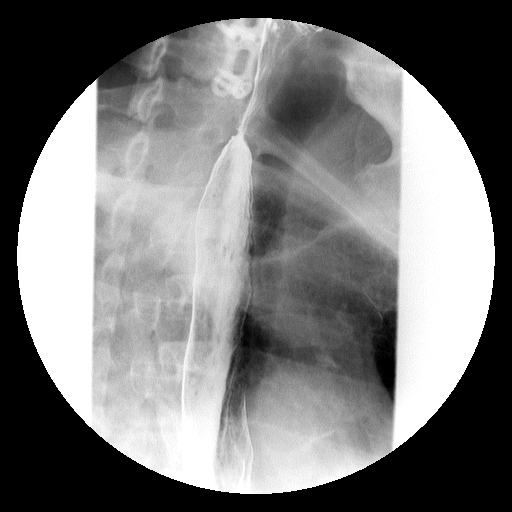
[im 8/23]
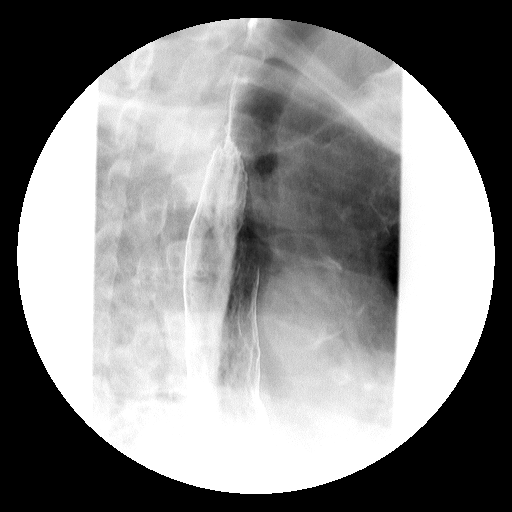
[im 10/23]
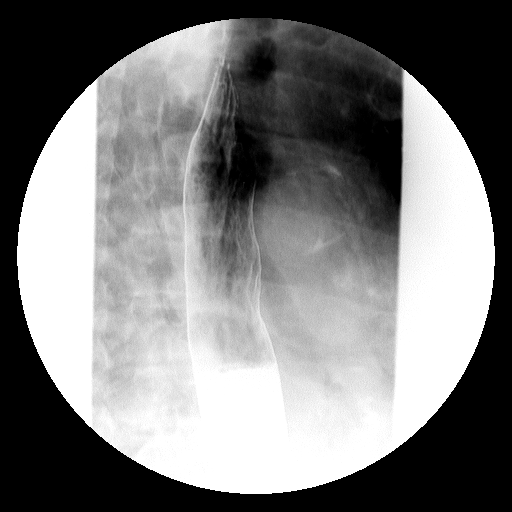
[im 12/23]
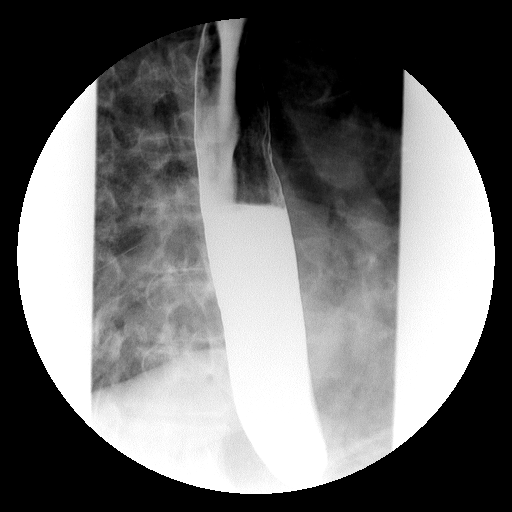
[im 13/23]
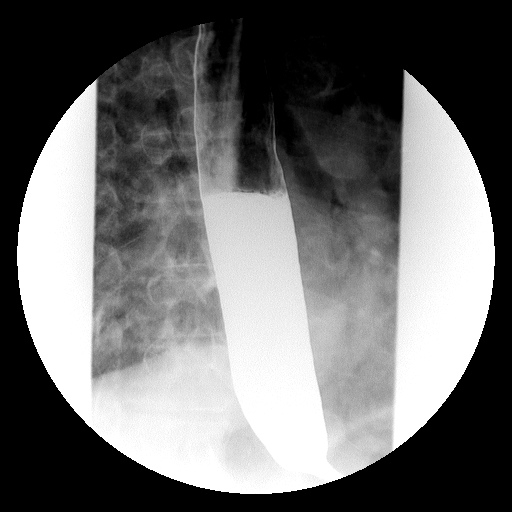
[im 15/23]
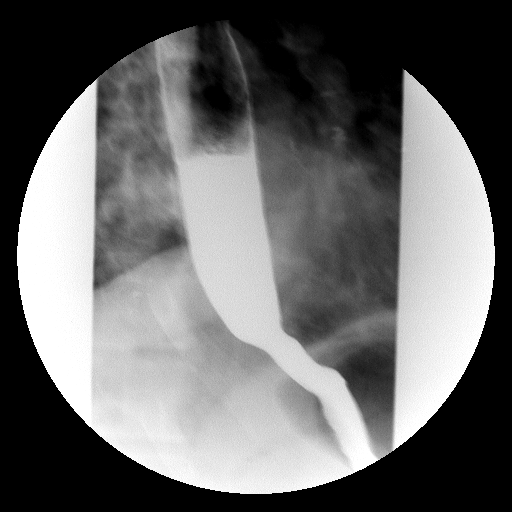
[im 16/23]
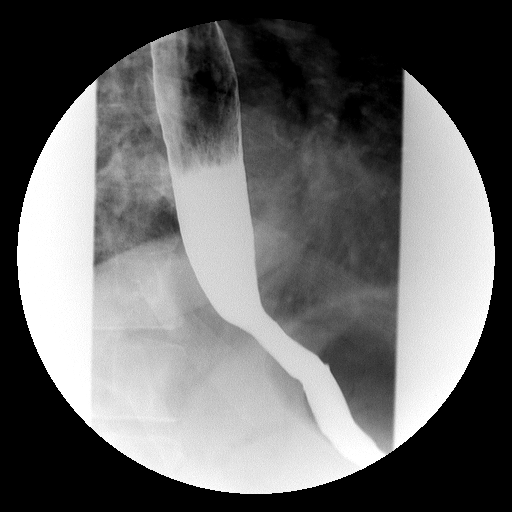
[im 18/23]
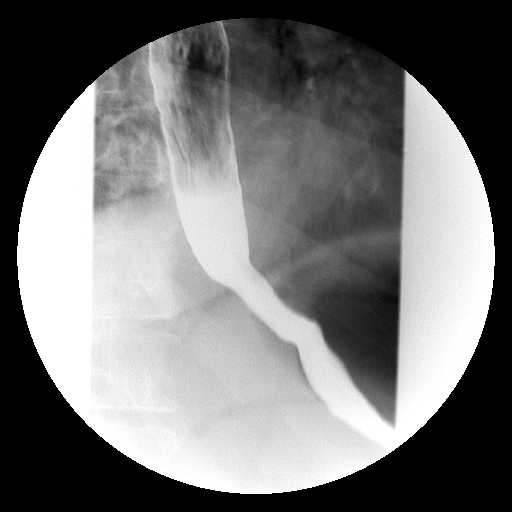
[im 19/23]
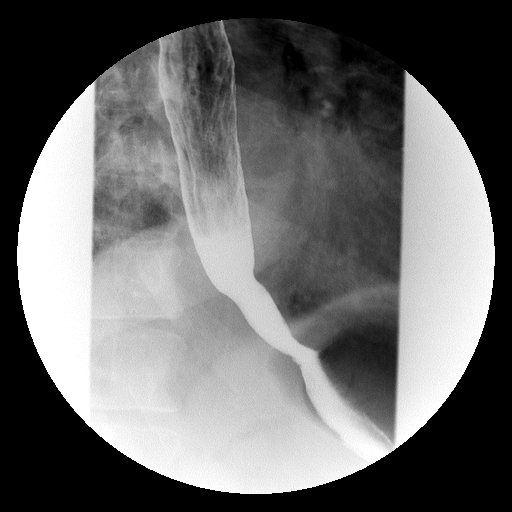
[im 21/23]
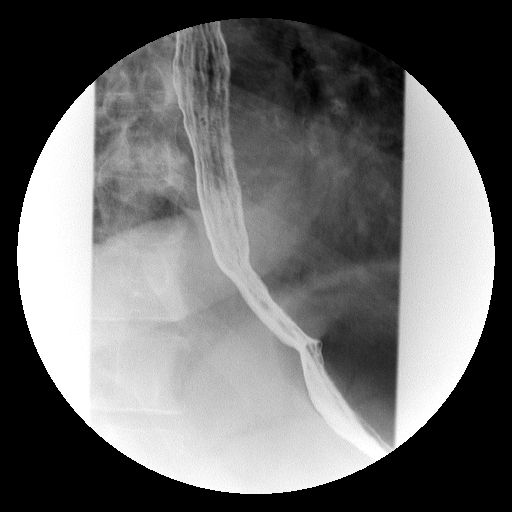
[im 23/23]
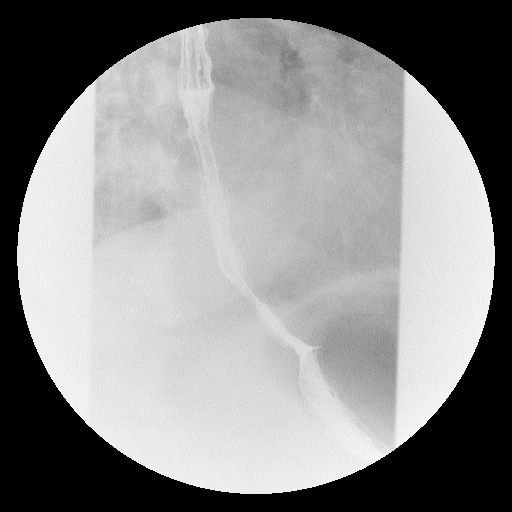

[Series 2: run · 1 of 1 slices shown (2 of 5)]
[im 1/1]
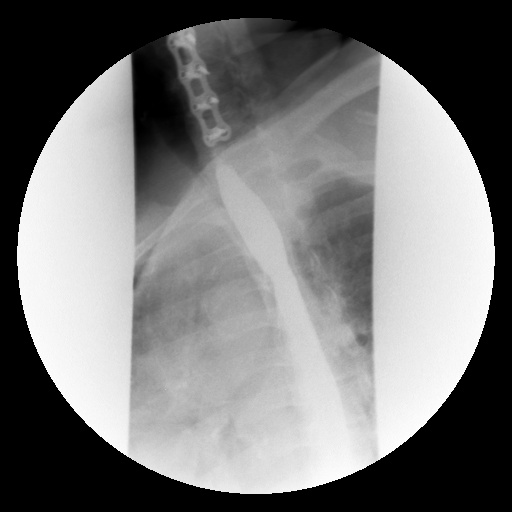

[Series 3: run · 1 of 1 slices shown (3 of 5)]
[im 1/1]
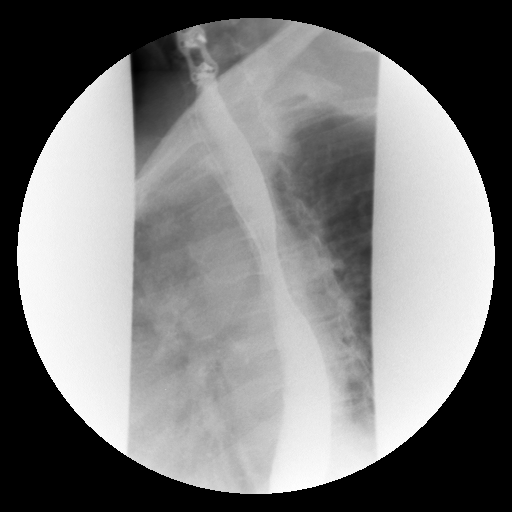

[Series 5: run · 1 of 1 slices shown (4 of 5)]
[im 1/1]
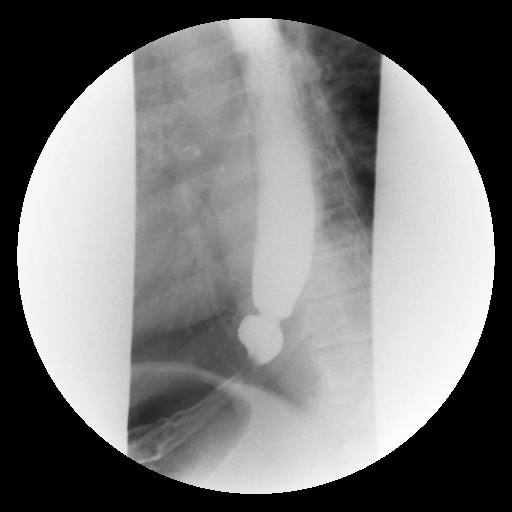

[Series 6: run · 1 of 1 slices shown (5 of 5)]
[im 1/1]
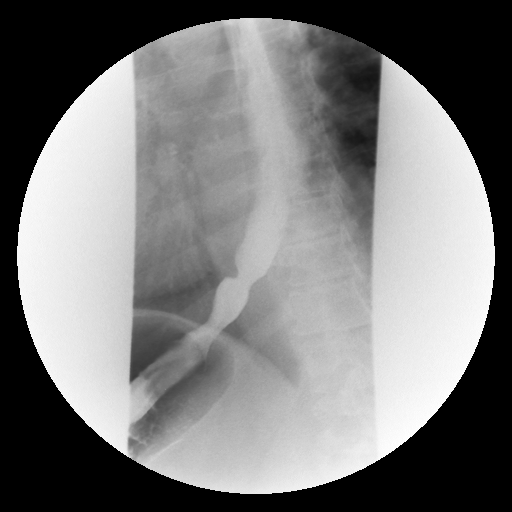

[19 of 24 positions shown; findings below may reference images not displayed]

FINDINGS: Small hiatal hernia without reflux.  No stricture.

Barium tablet passed readily into the stomach without delay

Mild decrease in esophageal peristalsis.
IMPRESSION: Small hiatal hernia without reflux or stricture. Mild esophageal
dysmotility.

## 2015-06-14 DIAGNOSIS — D509 Iron deficiency anemia, unspecified: Secondary | ICD-10-CM | POA: Diagnosis not present

## 2015-06-14 DIAGNOSIS — N186 End stage renal disease: Secondary | ICD-10-CM | POA: Diagnosis not present

## 2015-06-14 DIAGNOSIS — N2581 Secondary hyperparathyroidism of renal origin: Secondary | ICD-10-CM | POA: Diagnosis not present

## 2015-06-14 DIAGNOSIS — E119 Type 2 diabetes mellitus without complications: Secondary | ICD-10-CM | POA: Diagnosis not present

## 2015-06-14 DIAGNOSIS — D631 Anemia in chronic kidney disease: Secondary | ICD-10-CM | POA: Diagnosis not present

## 2015-06-15 DIAGNOSIS — L602 Onychogryphosis: Secondary | ICD-10-CM | POA: Diagnosis not present

## 2015-06-15 DIAGNOSIS — L84 Corns and callosities: Secondary | ICD-10-CM | POA: Diagnosis not present

## 2015-06-15 DIAGNOSIS — E1151 Type 2 diabetes mellitus with diabetic peripheral angiopathy without gangrene: Secondary | ICD-10-CM | POA: Diagnosis not present

## 2015-06-16 DIAGNOSIS — N186 End stage renal disease: Secondary | ICD-10-CM | POA: Diagnosis not present

## 2015-06-16 DIAGNOSIS — N2581 Secondary hyperparathyroidism of renal origin: Secondary | ICD-10-CM | POA: Diagnosis not present

## 2015-06-16 DIAGNOSIS — D509 Iron deficiency anemia, unspecified: Secondary | ICD-10-CM | POA: Diagnosis not present

## 2015-06-16 DIAGNOSIS — E119 Type 2 diabetes mellitus without complications: Secondary | ICD-10-CM | POA: Diagnosis not present

## 2015-06-16 DIAGNOSIS — D631 Anemia in chronic kidney disease: Secondary | ICD-10-CM | POA: Diagnosis not present

## 2015-06-18 DIAGNOSIS — N2581 Secondary hyperparathyroidism of renal origin: Secondary | ICD-10-CM | POA: Diagnosis not present

## 2015-06-18 DIAGNOSIS — E119 Type 2 diabetes mellitus without complications: Secondary | ICD-10-CM | POA: Diagnosis not present

## 2015-06-18 DIAGNOSIS — N186 End stage renal disease: Secondary | ICD-10-CM | POA: Diagnosis not present

## 2015-06-18 DIAGNOSIS — D631 Anemia in chronic kidney disease: Secondary | ICD-10-CM | POA: Diagnosis not present

## 2015-06-18 DIAGNOSIS — D509 Iron deficiency anemia, unspecified: Secondary | ICD-10-CM | POA: Diagnosis not present

## 2015-06-21 DIAGNOSIS — D509 Iron deficiency anemia, unspecified: Secondary | ICD-10-CM | POA: Diagnosis not present

## 2015-06-21 DIAGNOSIS — N186 End stage renal disease: Secondary | ICD-10-CM | POA: Diagnosis not present

## 2015-06-21 DIAGNOSIS — D631 Anemia in chronic kidney disease: Secondary | ICD-10-CM | POA: Diagnosis not present

## 2015-06-21 DIAGNOSIS — E119 Type 2 diabetes mellitus without complications: Secondary | ICD-10-CM | POA: Diagnosis not present

## 2015-06-21 DIAGNOSIS — N2581 Secondary hyperparathyroidism of renal origin: Secondary | ICD-10-CM | POA: Diagnosis not present

## 2015-06-23 DIAGNOSIS — D631 Anemia in chronic kidney disease: Secondary | ICD-10-CM | POA: Diagnosis not present

## 2015-06-23 DIAGNOSIS — E119 Type 2 diabetes mellitus without complications: Secondary | ICD-10-CM | POA: Diagnosis not present

## 2015-06-23 DIAGNOSIS — N186 End stage renal disease: Secondary | ICD-10-CM | POA: Diagnosis not present

## 2015-06-23 DIAGNOSIS — N2581 Secondary hyperparathyroidism of renal origin: Secondary | ICD-10-CM | POA: Diagnosis not present

## 2015-06-23 DIAGNOSIS — D509 Iron deficiency anemia, unspecified: Secondary | ICD-10-CM | POA: Diagnosis not present

## 2015-06-24 DIAGNOSIS — D649 Anemia, unspecified: Secondary | ICD-10-CM | POA: Diagnosis not present

## 2015-06-25 DIAGNOSIS — N2581 Secondary hyperparathyroidism of renal origin: Secondary | ICD-10-CM | POA: Diagnosis not present

## 2015-06-25 DIAGNOSIS — N186 End stage renal disease: Secondary | ICD-10-CM | POA: Diagnosis not present

## 2015-06-25 DIAGNOSIS — D631 Anemia in chronic kidney disease: Secondary | ICD-10-CM | POA: Diagnosis not present

## 2015-06-25 DIAGNOSIS — D509 Iron deficiency anemia, unspecified: Secondary | ICD-10-CM | POA: Diagnosis not present

## 2015-06-25 DIAGNOSIS — E119 Type 2 diabetes mellitus without complications: Secondary | ICD-10-CM | POA: Diagnosis not present

## 2015-06-28 DIAGNOSIS — D631 Anemia in chronic kidney disease: Secondary | ICD-10-CM | POA: Diagnosis not present

## 2015-06-28 DIAGNOSIS — N186 End stage renal disease: Secondary | ICD-10-CM | POA: Diagnosis not present

## 2015-06-28 DIAGNOSIS — N2581 Secondary hyperparathyroidism of renal origin: Secondary | ICD-10-CM | POA: Diagnosis not present

## 2015-06-28 DIAGNOSIS — D509 Iron deficiency anemia, unspecified: Secondary | ICD-10-CM | POA: Diagnosis not present

## 2015-06-28 DIAGNOSIS — E119 Type 2 diabetes mellitus without complications: Secondary | ICD-10-CM | POA: Diagnosis not present

## 2015-06-29 DIAGNOSIS — M170 Bilateral primary osteoarthritis of knee: Secondary | ICD-10-CM | POA: Diagnosis not present

## 2015-06-30 DIAGNOSIS — N2581 Secondary hyperparathyroidism of renal origin: Secondary | ICD-10-CM | POA: Diagnosis not present

## 2015-06-30 DIAGNOSIS — S63502A Unspecified sprain of left wrist, initial encounter: Secondary | ICD-10-CM | POA: Diagnosis not present

## 2015-06-30 DIAGNOSIS — N186 End stage renal disease: Secondary | ICD-10-CM | POA: Diagnosis not present

## 2015-06-30 DIAGNOSIS — D509 Iron deficiency anemia, unspecified: Secondary | ICD-10-CM | POA: Diagnosis not present

## 2015-06-30 DIAGNOSIS — E119 Type 2 diabetes mellitus without complications: Secondary | ICD-10-CM | POA: Diagnosis not present

## 2015-06-30 DIAGNOSIS — D631 Anemia in chronic kidney disease: Secondary | ICD-10-CM | POA: Diagnosis not present

## 2015-07-01 DIAGNOSIS — E1129 Type 2 diabetes mellitus with other diabetic kidney complication: Secondary | ICD-10-CM | POA: Diagnosis not present

## 2015-07-01 DIAGNOSIS — N186 End stage renal disease: Secondary | ICD-10-CM | POA: Diagnosis not present

## 2015-07-01 DIAGNOSIS — Z992 Dependence on renal dialysis: Secondary | ICD-10-CM | POA: Diagnosis not present

## 2015-07-02 DIAGNOSIS — N2581 Secondary hyperparathyroidism of renal origin: Secondary | ICD-10-CM | POA: Diagnosis not present

## 2015-07-02 DIAGNOSIS — D509 Iron deficiency anemia, unspecified: Secondary | ICD-10-CM | POA: Diagnosis not present

## 2015-07-02 DIAGNOSIS — Z23 Encounter for immunization: Secondary | ICD-10-CM | POA: Diagnosis not present

## 2015-07-02 DIAGNOSIS — N186 End stage renal disease: Secondary | ICD-10-CM | POA: Diagnosis not present

## 2015-07-02 DIAGNOSIS — D631 Anemia in chronic kidney disease: Secondary | ICD-10-CM | POA: Diagnosis not present

## 2015-07-02 DIAGNOSIS — E119 Type 2 diabetes mellitus without complications: Secondary | ICD-10-CM | POA: Diagnosis not present

## 2015-07-05 DIAGNOSIS — N186 End stage renal disease: Secondary | ICD-10-CM | POA: Diagnosis not present

## 2015-07-05 DIAGNOSIS — N2581 Secondary hyperparathyroidism of renal origin: Secondary | ICD-10-CM | POA: Diagnosis not present

## 2015-07-05 DIAGNOSIS — D509 Iron deficiency anemia, unspecified: Secondary | ICD-10-CM | POA: Diagnosis not present

## 2015-07-05 DIAGNOSIS — D631 Anemia in chronic kidney disease: Secondary | ICD-10-CM | POA: Diagnosis not present

## 2015-07-05 DIAGNOSIS — E119 Type 2 diabetes mellitus without complications: Secondary | ICD-10-CM | POA: Diagnosis not present

## 2015-07-05 DIAGNOSIS — Z23 Encounter for immunization: Secondary | ICD-10-CM | POA: Diagnosis not present

## 2015-07-07 DIAGNOSIS — D631 Anemia in chronic kidney disease: Secondary | ICD-10-CM | POA: Diagnosis not present

## 2015-07-07 DIAGNOSIS — E119 Type 2 diabetes mellitus without complications: Secondary | ICD-10-CM | POA: Diagnosis not present

## 2015-07-07 DIAGNOSIS — Z23 Encounter for immunization: Secondary | ICD-10-CM | POA: Diagnosis not present

## 2015-07-07 DIAGNOSIS — N2581 Secondary hyperparathyroidism of renal origin: Secondary | ICD-10-CM | POA: Diagnosis not present

## 2015-07-07 DIAGNOSIS — D509 Iron deficiency anemia, unspecified: Secondary | ICD-10-CM | POA: Diagnosis not present

## 2015-07-07 DIAGNOSIS — N186 End stage renal disease: Secondary | ICD-10-CM | POA: Diagnosis not present

## 2015-07-09 DIAGNOSIS — N2581 Secondary hyperparathyroidism of renal origin: Secondary | ICD-10-CM | POA: Diagnosis not present

## 2015-07-09 DIAGNOSIS — E119 Type 2 diabetes mellitus without complications: Secondary | ICD-10-CM | POA: Diagnosis not present

## 2015-07-09 DIAGNOSIS — D631 Anemia in chronic kidney disease: Secondary | ICD-10-CM | POA: Diagnosis not present

## 2015-07-09 DIAGNOSIS — Z23 Encounter for immunization: Secondary | ICD-10-CM | POA: Diagnosis not present

## 2015-07-09 DIAGNOSIS — D509 Iron deficiency anemia, unspecified: Secondary | ICD-10-CM | POA: Diagnosis not present

## 2015-07-09 DIAGNOSIS — N186 End stage renal disease: Secondary | ICD-10-CM | POA: Diagnosis not present

## 2015-07-11 DIAGNOSIS — M1711 Unilateral primary osteoarthritis, right knee: Secondary | ICD-10-CM | POA: Diagnosis not present

## 2015-07-11 NOTE — H&P (Signed)
TOTAL KNEE ADMISSION H&P  Patient is being admitted for right total knee arthroplasty.  Subjective:  Chief Complaint:right knee pain.  HPI: [name], 64 y.o. male, has a history of pain and functional disability in the right knee due to arthritis and has failed non-surgical conservative treatments for greater than 12 weeks to includeNSAID's and/or analgesics, corticosteriod injections, supervised PT with diminished ADL's post treatment, use of assistive devices and activity modification.  Onset of symptoms was gradual, starting 3 years ago with gradually worsening course since that time. The patient noted no past surgery on the right knee(s).  Patient currently rates pain in the right knee(s) at 8 out of 10 with activity. Patient has night pain, worsening of pain with activity and weight bearing, pain that interferes with activities of daily living and crepitus.  Patient has evidence of joint space narrowing by imaging studies. There is no active infection.  Patient Active Problem List   Diagnosis Date Noted  . Headache 06/08/2015  . Melena 04/19/2015  . Acute blood loss anemia 04/18/2015  . Dialysis patient ([place]) 04/18/2015  . Hyperthyroidism 03/11/2015  . Thigh pain 12/08/2014  . PSA elevation 08/30/2014  . Allergic rhinitis, cause unspecified 02/24/2014  . Elevated PSA 02/24/2014  . Vertigo 10/14/2013  . Numbness 05/15/2013  . ESRD on dialysis ([place]) 04/06/2013  . Chronic systolic heart failure ([place]) 02/09/2013  . Other complications due to renal dialysis device, implant, and graft 01/14/2013  . Right hand pain 03/28/2012  . Dysphagia 03/28/2012  . Hypogonadism, male 03/28/2012  . OSA (obstructive sleep apnea) 10/16/2011  . Hyperlipidemia 10/16/2011  . CAD (coronary artery disease) 09/28/2011  . NSTEMI (non-ST elevated myocardial infarction) ([place]) 09/28/2011  . AVM (arteriovenous malformation) 09/27/2011  . Anemia associated with acute blood loss 09/26/2011  . Anemia  06/16/2011  . Ischemic cardiomyopathy 06/16/2011  . Hyperparathyroidism, secondary ([place]) 06/16/2011  . Preventative health care 03/11/2011  . NECK PAIN 07/31/2010  . GYNECOMASTIA 07/17/2010  . DIZZINESS 07/17/2010  . GAIT DISTURBANCE 03/03/2010  . Thyrotoxicosis 02/02/2010  . Diabetes ([place]) 02/01/2010  . TRANSAMINASES, SERUM, ELEVATED 02/01/2010  . Other malaise and fatigue 11/24/2009  . DEPRESSION 10/14/2009  . BENIGN PROSTATIC HYPERTROPHY 10/14/2009  . COLONIC POLYPS, HX OF 10/14/2009  . Dementia 09/02/2009  . PULMONARY NODULE, RIGHT LOWER LOBE 06/08/2009  . DIASTOLIC HEART FAILURE, CHRONIC 02/06/2009  . DYSPNEA 10/29/2008  . FOOT PAIN 08/12/2008  . ONYCHOMYCOSIS, TOENAILS 12/26/2007  . GOITER, MULTINODULAR 12/26/2007  . GOUT 06/18/2007  . PERIPHERAL NEUROPATHY 06/18/2007  . Essential hypertension 06/18/2007  . GERD 06/18/2007   Past Medical History  Diagnosis Date  . Hyperthyroidism   . GASTROENTERITIS, VIRAL 10/14/2009  . ONYCHOMYCOSIS, TOENAILS 12/26/2007  . GOITER, MULTINODULAR 12/26/2007  . HYPERTHYROIDISM 02/02/2010  . DIABETES MELLITUS, TYPE II 02/01/2010  . DYSLIPIDEMIA 06/18/2007  . GOUT 06/18/2007  . Hypocalcemia 06/07/2010  . DEPRESSION 10/14/2009  . PERIPHERAL NEUROPATHY 06/18/2007  . HYPERTENSION 06/18/2007  . CONGESTIVE HEART FAILURE 06/18/2007  . DIASTOLIC HEART FAILURE, CHRONIC 02/06/2009  . Unspecified hypotension 01/30/2010  . PULMONARY NODULE, RIGHT LOWER LOBE 06/08/2009  . GERD 06/18/2007  . BENIGN PROSTATIC HYPERTROPHY 10/14/2009  . GYNECOMASTIA 07/17/2010  . NECK PAIN 07/31/2010  . FOOT PAIN 08/12/2008  . DIZZINESS 07/17/2010  . Other malaise and fatigue 11/24/2009  . GAIT DISTURBANCE 03/03/2010  . DYSPNEA 10/29/2008  . CHEST PAIN 03/29/2010  . TRANSAMINASES, SERUM, ELEVATED 02/01/2010  . COLONIC POLYPS, HX OF 10/14/2009  . Anemia 06/16/2011  . Ischemic cardiomyopathy  06/16/2011  . Hyperparathyroidism, secondary 06/16/2011  . OSA on CPAP 10/16/2011  . Hyperlipidemia  10/16/2011  . CAD, NATIVE VESSEL 02/06/2009    saw Dr. Missy Sabins last jan 2013  . Sleep apnea     cpap machine and o2  . Allergic rhinitis, cause unspecified 02/24/2014  . Transfusion history     none recent  . Hemodialysis access, fistula mature     Dialysis T-Th-Sa ([place]) Right upper arm fistula  . ESRD (end stage renal disease) on dialysis 08/04/2010    "TTS;  " (04/18/2015)  . Dementia [id number]    Past Surgical History  Procedure Laterality Date  . Coronary angioplasty with stent placement  06/11/2008  . Cervical spine surgery  2/09    "to repair nerve problems in my left arm"  . Back surgery  1998  . Esophagogastroduodenoscopy  09/28/2011    Procedure: ESOPHAGOGASTRODUODENOSCOPY (EGD);  Surgeon: Missy Sabins, MD;  Location: [facility] ENDOSCOPY;  Service: Endoscopy;  Laterality: N/A;  . Givens capsule study  09/30/2011    Procedure: GIVENS CAPSULE STUDY;  Surgeon: Jeryl Columbia, MD;  Location: [facility] ENDOSCOPY;  Service: Endoscopy;  Laterality: N/A;  . Cholecystectomy    . Coronary angioplasty with stent placement  06/2007    TAXUS stent to RCA/notes 01/31/2011  . Tonsillectomy    . Av fistula placement  11/07/2011    Procedure: INSERTION OF ARTERIOVENOUS (AV) GORE-TEX GRAFT ARM;  Surgeon: Tinnie Gens, MD;  Location: [place];  Service: Vascular;  Laterality: Left;  . Arteriovenous graft placement Right 2009    forearm/notes 02/01/2011  . Insertion of dialysis catheter Right 2014  . Insertion of dialysis catheter Left 02/11/2013    Procedure: INSERTION OF DIALYSIS CATHETER;  Surgeon: Conrad McCurtain, MD;  Location: [place];  Service: Vascular;  Laterality: Left;  Ultrasound guided  . Removal of a dialysis catheter Right 02/11/2013    Procedure: REMOVAL OF A DIALYSIS CATHETER;  Surgeon: Conrad Golva, MD;  Location: [place];  Service: Vascular;  Laterality: Right;  . Bascilic vein transposition Right 02/27/2013    Procedure: BASCILIC VEIN TRANSPOSITION;  Surgeon: Mal Misty, MD;   Location: [place];  Service: Vascular;  Laterality: Right;  Right Basilic Vein Transposition   . Shuntogram N/A 09/20/2011    Procedure: Earney Mallet;  Surgeon: Conrad Orleans, MD;  Location: [facility] CATH LAB;  Service: Cardiovascular;  Laterality: N/A;  . Venogram N/A 01/26/2013    Procedure: VENOGRAM;  Surgeon: Angelia Mould, MD;  Location: [facility] CATH LAB;  Service: Cardiovascular;  Laterality: N/A;  . Esophagogastroduodenoscopy N/A 04/07/2015    Procedure: ESOPHAGOGASTRODUODENOSCOPY (EGD);  Surgeon: Teena Irani, MD;  Location: [facility] ENDOSCOPY;  Service: Endoscopy;  Laterality: N/A;  . Savory dilation N/A 04/07/2015    Procedure: SAVORY DILATION;  Surgeon: Teena Irani, MD;  Location: [facility] ENDOSCOPY;  Service: Endoscopy;  Laterality: N/A;  . Foreign body removal  09/2003    via upper endoscopy/notes 02/12/2011  . Esophagogastroduodenoscopy N/A 04/19/2015    Procedure: ESOPHAGOGASTRODUODENOSCOPY (EGD);  Surgeon: Arta Silence, MD;  Location: [facility] ENDOSCOPY;  Service: Endoscopy;  Laterality: N/A;    No prescriptions prior to admission   Allergies  Allergen Reactions  . Cephalexin Other (See Comments)    Tongue swelling  . Statins     Weak muscles    Social History  Substance Use Topics  . Smoking status: Former Smoker -- 1.00 packs/day for 25 years    Types: Cigarettes  . Smokeless tobacco: Former  User    Quit date: 10/01/2005     Comment: Quit smoking 2007 Smoked x 25 years 1/2 ppd.  . Alcohol Use: 0.0 oz/week    0 Standard drinks or equivalent per week     Comment: occassional     Family History  Problem Relation Age of Onset  . Heart disease Father   . Heart disease Sister      Review of Systems  Constitutional: Negative for fever and chills.  HENT: Negative for congestion and sore throat.   Eyes: Negative for double vision.  Respiratory: Negative for cough, shortness of breath and wheezing.   Cardiovascular: Negative for chest pain and palpitations.  Gastrointestinal: Negative for nausea,  vomiting and abdominal pain.  Musculoskeletal:       Right knee pain that is worse with prolonged ambulation  Skin: Negative for rash.  Neurological: Negative for dizziness and loss of consciousness.  Psychiatric/Behavioral: Negative for depression and suicidal ideas.    Objective:  Physical Exam  Constitutional: He is oriented to person, place, and time. He appears well-developed and well-nourished.  HENT:  Head: Normocephalic and atraumatic.  Eyes: Conjunctivae and EOM are normal. Pupils are equal, round, and reactive to light.  Neck: Normal range of motion. Neck supple.  Cardiovascular: Normal rate and intact distal pulses.   Respiratory: Effort normal and breath sounds normal.  GI: Soft. Bowel sounds are normal.  Musculoskeletal:  Decreased ROM of the R knee due to pain, crepitus   Neurological: He is alert and oriented to person, place, and time.  Skin: Skin is warm and dry.  Psychiatric: He has a normal mood and affect. His behavior is normal. Judgment and thought content normal.    Vital signs in last 24 hours:    Labs:   Estimated body mass index is 31.67 kg/(m^2) as calculated from the following:   Height as of 06/08/15: 6\' 1"  (1.854 m).   Weight as of 06/08/15: 108.863 kg (240 lb).   Imaging Review Plain radiographs demonstrate severe degenerative joint disease of the right knee(s). The overall alignment isneutral. The bone quality appears to be fair for age and reported activity level.  Assessment/Plan:  End stage arthritis, right knee   The patient history, physical examination, clinical judgment of the provider and imaging studies are consistent with end stage degenerative joint disease of the right knee(s) and total knee arthroplasty is deemed medically necessary. The treatment options including medical management, injection therapy arthroscopy and arthroplasty were discussed at length. The risks and benefits of total knee arthroplasty were presented and  reviewed. The risks due to aseptic loosening, infection, stiffness, patella tracking problems, thromboembolic complications and other imponderables were discussed. The patient acknowledged the explanation, agreed to proceed with the plan and consent was signed. Patient is being admitted for inpatient treatment for surgery, pain control, PT, OT, prophylactic antibiotics, VTE prophylaxis, progressive ambulation and ADL's and discharge planning. The patient is planning to be discharged home with home health services

## 2015-07-12 DIAGNOSIS — N186 End stage renal disease: Secondary | ICD-10-CM | POA: Diagnosis not present

## 2015-07-12 DIAGNOSIS — N2581 Secondary hyperparathyroidism of renal origin: Secondary | ICD-10-CM | POA: Diagnosis not present

## 2015-07-12 DIAGNOSIS — D509 Iron deficiency anemia, unspecified: Secondary | ICD-10-CM | POA: Diagnosis not present

## 2015-07-12 DIAGNOSIS — D631 Anemia in chronic kidney disease: Secondary | ICD-10-CM | POA: Diagnosis not present

## 2015-07-12 DIAGNOSIS — E119 Type 2 diabetes mellitus without complications: Secondary | ICD-10-CM | POA: Diagnosis not present

## 2015-07-12 DIAGNOSIS — Z23 Encounter for immunization: Secondary | ICD-10-CM | POA: Diagnosis not present

## 2015-07-13 ENCOUNTER — Other Ambulatory Visit: Payer: Self-pay | Admitting: Nephrology

## 2015-07-13 ENCOUNTER — Ambulatory Visit
Admission: RE | Admit: 2015-07-13 | Discharge: 2015-07-13 | Disposition: A | Discharge status: Home / Self Care | Payer: Medicare Other | Source: Ambulatory Visit | Attending: Nephrology | Admitting: Nephrology

## 2015-07-13 DIAGNOSIS — M1610 Unilateral primary osteoarthritis, unspecified hip: Secondary | ICD-10-CM | POA: Diagnosis not present

## 2015-07-13 DIAGNOSIS — R102 Pelvic and perineal pain: Secondary | ICD-10-CM

## 2015-07-14 DIAGNOSIS — D631 Anemia in chronic kidney disease: Secondary | ICD-10-CM | POA: Diagnosis not present

## 2015-07-14 DIAGNOSIS — N186 End stage renal disease: Secondary | ICD-10-CM | POA: Diagnosis not present

## 2015-07-14 DIAGNOSIS — E119 Type 2 diabetes mellitus without complications: Secondary | ICD-10-CM | POA: Diagnosis not present

## 2015-07-14 DIAGNOSIS — Z23 Encounter for immunization: Secondary | ICD-10-CM | POA: Diagnosis not present

## 2015-07-14 DIAGNOSIS — D509 Iron deficiency anemia, unspecified: Secondary | ICD-10-CM | POA: Diagnosis not present

## 2015-07-14 DIAGNOSIS — N2581 Secondary hyperparathyroidism of renal origin: Secondary | ICD-10-CM | POA: Diagnosis not present

## 2015-07-16 DIAGNOSIS — E119 Type 2 diabetes mellitus without complications: Secondary | ICD-10-CM | POA: Diagnosis not present

## 2015-07-16 DIAGNOSIS — D509 Iron deficiency anemia, unspecified: Secondary | ICD-10-CM | POA: Diagnosis not present

## 2015-07-16 DIAGNOSIS — Z23 Encounter for immunization: Secondary | ICD-10-CM | POA: Diagnosis not present

## 2015-07-16 DIAGNOSIS — N2581 Secondary hyperparathyroidism of renal origin: Secondary | ICD-10-CM | POA: Diagnosis not present

## 2015-07-16 DIAGNOSIS — D631 Anemia in chronic kidney disease: Secondary | ICD-10-CM | POA: Diagnosis not present

## 2015-07-16 DIAGNOSIS — N186 End stage renal disease: Secondary | ICD-10-CM | POA: Diagnosis not present

## 2015-07-18 ENCOUNTER — Ambulatory Visit: Payer: Medicare Other | Admitting: Neurology

## 2015-07-19 DIAGNOSIS — D631 Anemia in chronic kidney disease: Secondary | ICD-10-CM | POA: Diagnosis not present

## 2015-07-19 DIAGNOSIS — N186 End stage renal disease: Secondary | ICD-10-CM | POA: Diagnosis not present

## 2015-07-19 DIAGNOSIS — E119 Type 2 diabetes mellitus without complications: Secondary | ICD-10-CM | POA: Diagnosis not present

## 2015-07-19 DIAGNOSIS — Z23 Encounter for immunization: Secondary | ICD-10-CM | POA: Diagnosis not present

## 2015-07-19 DIAGNOSIS — D509 Iron deficiency anemia, unspecified: Secondary | ICD-10-CM | POA: Diagnosis not present

## 2015-07-19 DIAGNOSIS — N2581 Secondary hyperparathyroidism of renal origin: Secondary | ICD-10-CM | POA: Diagnosis not present

## 2015-07-20 ENCOUNTER — Ambulatory Visit (HOSPITAL_COMMUNITY)
Admission: RE | Admit: 2015-07-20 | Discharge: 2015-07-20 | Disposition: A | Discharge status: Home / Self Care | Payer: Medicare Other | Source: Ambulatory Visit | Attending: Orthopedic Surgery | Admitting: Orthopedic Surgery

## 2015-07-20 DIAGNOSIS — Z992 Dependence on renal dialysis: Secondary | ICD-10-CM | POA: Diagnosis not present

## 2015-07-20 DIAGNOSIS — N186 End stage renal disease: Secondary | ICD-10-CM | POA: Insufficient documentation

## 2015-07-20 DIAGNOSIS — I120 Hypertensive chronic kidney disease with stage 5 chronic kidney disease or end stage renal disease: Secondary | ICD-10-CM | POA: Diagnosis not present

## 2015-07-20 LAB — SURGICAL PCR SCREEN
MRSA, PCR: NEGATIVE
STAPHYLOCOCCUS AUREUS: NEGATIVE

## 2015-07-20 LAB — CBC
HEMATOCRIT: 37.5 % — AB (ref 39.0–52.0)
Hemoglobin: 11.7 g/dL — ABNORMAL LOW (ref 13.0–17.0)
MCH: 29 pg (ref 26.0–34.0)
MCHC: 31.2 g/dL (ref 30.0–36.0)
MCV: 92.8 fL (ref 78.0–100.0)
Platelets: 248 10*3/uL (ref 150–400)
RBC: 4.04 MIL/uL — AB (ref 4.22–5.81)
RDW: 18 % — AB (ref 11.5–15.5)
WBC: 6.8 10*3/uL (ref 4.0–10.5)

## 2015-07-20 LAB — BASIC METABOLIC PANEL
Anion gap: 14 (ref 5–15)
BUN: 31 mg/dL — AB (ref 6–20)
CHLORIDE: 101 mmol/L (ref 101–111)
CO2: 26 mmol/L (ref 22–32)
CREATININE: 9.17 mg/dL — AB (ref 0.61–1.24)
Calcium: 9.3 mg/dL (ref 8.9–10.3)
GFR calc Af Amer: 6 mL/min — ABNORMAL LOW (ref >60)
GFR calc non Af Amer: 5 mL/min — ABNORMAL LOW (ref >60)
Glucose, Bld: 103 mg/dL — ABNORMAL HIGH (ref 65–99)
POTASSIUM: 4.6 mmol/L (ref 3.5–5.1)
SODIUM: 141 mmol/L (ref 135–145)

## 2015-07-20 LAB — GLUCOSE, CAPILLARY: Glucose-Capillary: 98 mg/dL (ref 65–99)

## 2015-07-20 LAB — PROTIME-INR
INR: 1.04 (ref 0.00–1.49)
Prothrombin Time: 13.8 seconds (ref 11.6–15.2)

## 2015-07-20 MED ORDER — CHLORHEXIDINE GLUCONATE 4 % EX LIQD: 60.0000 mL | Freq: Once | CUTANEOUS | Status: DC

## 2015-07-20 NOTE — Pre-Procedure Instructions (Signed)
[name]  07/20/2015      [facility] - [place] ([place]), [place] - [place] [id number] [place] ([place]) [place] [address] Phone: [phone] Fax: [phone]  [facility] [place], [place] [address] Phone: [phone] Fax: [phone]    Your procedure is scheduled on August 02, 2015.  Report to [facility] Admitting at 7:45 A.M.  Call this number if you have problems the morning of surgery:  [phone]   Remember:  Do not eat food or drink liquids after midnight.  Take these medicines the morning of surgery with A SIP OF WATER :amLODipine (NORVASC) , allopurinol  (ZYLOPRIM), carvedilol (COREG) ; If needed- DEXILANT, pregabalin (LYRICA)   STOP ASPIRIN,  HERBAL MEDICATIONS, NSAIDS (ADVIL, IBUPROFEN) ONE WEEK PRIOR TO SURGERY    How to Manage Your Diabetes Before Surgery   Why is it important to control my blood sugar before and after surgery?   Improving blood sugar levels before and after surgery helps healing and can limit problems.  A way of improving blood sugar control is eating a healthy diet by:  - Eating less sugar and carbohydrates  - Increasing activity/exercise  - Talk with your doctor about reaching your blood sugar goals  High blood sugars (greater than 180 mg/dL) can raise your risk of infections and slow down your recovery so you will need to focus on controlling your diabetes during the weeks before surgery.  Make sure that the doctor who takes care of your diabetes knows about your planned surgery including the date and location.  How do I manage my blood sugars before surgery?   Check your blood sugar at least 4 times a day, 2 days before surgery to make sure that they are not too high or low.   Check your blood sugar the morning of your surgery when you wake up and every 2               hours until you get to the Short-Stay unit.  If your  blood sugar is less than 70 mg/dL, you will need to treat for low blood sugar by:  Treat a low blood sugar (less than 70 mg/dL) with 1/2 cup of clear juice (cranberry or apple), 4 glucose tablets, OR glucose gel.  Recheck blood sugar in 15 minutes after treatment (to make sure it is greater than 70 mg/dL).  If blood sugar is not greater than 70 mg/dL on re-check, call [phone] for further instructions.   Report your blood sugar to the Short-Stay nurse when you get to Short-Stay.  References:  [facility], 2007 "How to Manage your Diabetes Before and After Surgery".  What do I do about my diabetes medications?   Do not take oral diabetes medicines (pills) the morning of surgery.      Do not wear jewelry, make-up or nail polish.  Do not wear lotions, powders, or perfumes.  You may wear deodorant.  Do not shave 48 hours prior to surgery.  Men may shave face and neck.  Do not bring valuables to the hospital.  [facility] is not responsible for any belongings or valuables.  Contacts, dentures or bridgework may not be worn into surgery.  Leave your suitcase in the car.  After surgery it may be brought to your room.  For patients admitted to the hospital, discharge time will be determined by  your treatment team.  Patients discharged the day of surgery will not be allowed to drive home.   Name and phone number of your driver:    Special instructions:  Preparing for Surgery  Please read over the following fact sheets that you were given. Pain Booklet, Coughing and Deep Breathing, Blood Transfusion Information and Surgical Site Infection Prevention

## 2015-07-20 NOTE — Progress Notes (Addendum)
Hx: CRI on hemodialysis Pt unable to void during PAT. He was instructed to bring sample back any day he is able to void up until day of surgery. Pt and spouse verbalize understanding  Type and screen day of surgery.  Pt transfused 04/22/15  Hemodialysis T,TH,SAT at [facility] via right arm graft

## 2015-07-21 DIAGNOSIS — D631 Anemia in chronic kidney disease: Secondary | ICD-10-CM | POA: Diagnosis not present

## 2015-07-21 DIAGNOSIS — N2581 Secondary hyperparathyroidism of renal origin: Secondary | ICD-10-CM | POA: Diagnosis not present

## 2015-07-21 DIAGNOSIS — N186 End stage renal disease: Secondary | ICD-10-CM | POA: Diagnosis not present

## 2015-07-21 DIAGNOSIS — E119 Type 2 diabetes mellitus without complications: Secondary | ICD-10-CM | POA: Diagnosis not present

## 2015-07-21 DIAGNOSIS — D509 Iron deficiency anemia, unspecified: Secondary | ICD-10-CM | POA: Diagnosis not present

## 2015-07-21 DIAGNOSIS — Z23 Encounter for immunization: Secondary | ICD-10-CM | POA: Diagnosis not present

## 2015-07-21 LAB — HEMOGLOBIN A1C
Hgb A1c MFr Bld: 6.1 % — ABNORMAL HIGH (ref 4.8–5.6)
MEAN PLASMA GLUCOSE: 128 mg/dL

## 2015-07-21 NOTE — Progress Notes (Signed)
Anesthesia Chart Review: Patient is a 64 year old male scheduled for right TKA on 08/02/15 by Dr. Edmonia Lynch.  History includes ESRD on HD TTS [place] (right basilic vein transposition 02/27/13), CAD s/p DES RCA 06/2007, ischemic CM (EF 55% 0000000), chronic sytolic and diastolic CHF, hyperthyroidism, multi-nodular goiter, DM2 with peripheral neuropathy, GERD, dyslipidemia (unable to tolerate statins), depression, HTN, BPH, elevated transaminases, anemia (Hgb 3.7 s/p 5 units PRBC 09/2011; endoscopy revealed no clear source), secondary hyperparathyroidism, OSA on CPAP/O2, former smoker, dementia, lumbar and cervical spine surgeries.BMI is consistent with mild obesity.   PCP is Dr. [name]. Endocrinologist is Dr. Renato Shin. Neurologist is Dr. Ellouise Newer. GI is Dr. Amedeo Plenty. Pulmonologist is Dr. Halford Chessman (for OSA). Cardiologist is Dr. [name]. Nephrologist is Dr. Mercy Moore.  **Dr. Percell Miller received surgical clearance notes from Dr. Mercy Moore, Dr. Loanne Drilling, Dr. [name], Dr. Gwenette Greet, and Dr. [name].**  04/18/15 EKG: SR, borderline repolarization abnormality.  01/12/15 Echo: Study Conclusions - Left ventricle: Average global longitudinal strain is -16.6 % The cavity size was mildly dilated. Systolic function was normal. The estimated ejection fraction was in the range of 50% to 55%. Wall motion was normal; there were no regional wall motion abnormalities. There was an increased relative contribution of atrial contraction to ventricular filling. Doppler parameters are consistent with abnormal left ventricular relaxation (grade 1 diastolic dysfunction). - Mitral valve: There was trivial regurgitation. - Tricuspid valve: There was trivial regurgitation. (Previous EF by echo 25-30% 03/26/14, 55% 09/27/11.)  10/15/12 Nuclear stress test: Overall Impression: Low risk stress nuclear study. LV Ejection Fraction: 33%. LV Wall Motion: There is global hypokinesis. The LV is markedly enlarged.  11/11/09 RHC: Low  pressures (PCWP = 2) , PA 26/16 (22) and normal cardiac output. (Full RHC report can be viewed under Notes tab.)  06/10/07 LHC:  FINDINGS: LV was not done. LVEDP was 16 mmHg. Left main was patent.LAD has 50-60% proximal stenosis. Diagonal-I and diagonal-II were smallwhich were patent. Left circumflex has 30-40% proximal stenosis. High OM-1 is moderate size, adjust 20-25% stenosis. OM-2 has 20-25% proximal stenosis. RCA has 70-75% proximal stenosis. PDA is patent. PLVbranches are very small. He is s/p PTCA and successful deployment of 3.5 x 16-mm-long Taxus drug-eluting stent in proximal right coronary artery on 06/12/07.  Preoperative labs noted. He will need an ISTAT on arrival. He was unable to provide a urine specimen at PAT.   Patient has clearances from multiple physicians. If ISTAT results acceptable and otherwise no acute changes then I would anticipate that he could proceed as planned.  [facility] Short Stay Center/Anesthesiology Phone [phone] 07/21/2015 6:05 PM

## 2015-07-22 DIAGNOSIS — C61 Malignant neoplasm of prostate: Secondary | ICD-10-CM | POA: Diagnosis not present

## 2015-07-22 DIAGNOSIS — R972 Elevated prostate specific antigen [PSA]: Secondary | ICD-10-CM | POA: Diagnosis not present

## 2015-07-23 DIAGNOSIS — E119 Type 2 diabetes mellitus without complications: Secondary | ICD-10-CM | POA: Diagnosis not present

## 2015-07-23 DIAGNOSIS — N2581 Secondary hyperparathyroidism of renal origin: Secondary | ICD-10-CM | POA: Diagnosis not present

## 2015-07-23 DIAGNOSIS — Z23 Encounter for immunization: Secondary | ICD-10-CM | POA: Diagnosis not present

## 2015-07-23 DIAGNOSIS — D509 Iron deficiency anemia, unspecified: Secondary | ICD-10-CM | POA: Diagnosis not present

## 2015-07-23 DIAGNOSIS — N186 End stage renal disease: Secondary | ICD-10-CM | POA: Diagnosis not present

## 2015-07-23 DIAGNOSIS — D631 Anemia in chronic kidney disease: Secondary | ICD-10-CM | POA: Diagnosis not present

## 2015-07-25 DIAGNOSIS — D649 Anemia, unspecified: Secondary | ICD-10-CM | POA: Diagnosis not present

## 2015-07-25 DIAGNOSIS — K2210 Ulcer of esophagus without bleeding: Secondary | ICD-10-CM | POA: Diagnosis not present

## 2015-07-25 DIAGNOSIS — M25661 Stiffness of right knee, not elsewhere classified: Secondary | ICD-10-CM | POA: Diagnosis not present

## 2015-07-25 DIAGNOSIS — R262 Difficulty in walking, not elsewhere classified: Secondary | ICD-10-CM | POA: Diagnosis not present

## 2015-07-25 DIAGNOSIS — M25561 Pain in right knee: Secondary | ICD-10-CM | POA: Diagnosis not present

## 2015-07-25 DIAGNOSIS — M6281 Muscle weakness (generalized): Secondary | ICD-10-CM | POA: Diagnosis not present

## 2015-07-26 DIAGNOSIS — E119 Type 2 diabetes mellitus without complications: Secondary | ICD-10-CM | POA: Diagnosis not present

## 2015-07-26 DIAGNOSIS — Z23 Encounter for immunization: Secondary | ICD-10-CM | POA: Diagnosis not present

## 2015-07-26 DIAGNOSIS — D631 Anemia in chronic kidney disease: Secondary | ICD-10-CM | POA: Diagnosis not present

## 2015-07-26 DIAGNOSIS — D509 Iron deficiency anemia, unspecified: Secondary | ICD-10-CM | POA: Diagnosis not present

## 2015-07-26 DIAGNOSIS — N2581 Secondary hyperparathyroidism of renal origin: Secondary | ICD-10-CM | POA: Diagnosis not present

## 2015-07-26 DIAGNOSIS — N186 End stage renal disease: Secondary | ICD-10-CM | POA: Diagnosis not present

## 2015-07-28 DIAGNOSIS — E119 Type 2 diabetes mellitus without complications: Secondary | ICD-10-CM | POA: Diagnosis not present

## 2015-07-28 DIAGNOSIS — N2581 Secondary hyperparathyroidism of renal origin: Secondary | ICD-10-CM | POA: Diagnosis not present

## 2015-07-28 DIAGNOSIS — Z23 Encounter for immunization: Secondary | ICD-10-CM | POA: Diagnosis not present

## 2015-07-28 DIAGNOSIS — N186 End stage renal disease: Secondary | ICD-10-CM | POA: Diagnosis not present

## 2015-07-28 DIAGNOSIS — D631 Anemia in chronic kidney disease: Secondary | ICD-10-CM | POA: Diagnosis not present

## 2015-07-28 DIAGNOSIS — D509 Iron deficiency anemia, unspecified: Secondary | ICD-10-CM | POA: Diagnosis not present

## 2015-07-30 DIAGNOSIS — D631 Anemia in chronic kidney disease: Secondary | ICD-10-CM | POA: Diagnosis not present

## 2015-07-30 DIAGNOSIS — Z23 Encounter for immunization: Secondary | ICD-10-CM | POA: Diagnosis not present

## 2015-07-30 DIAGNOSIS — N186 End stage renal disease: Secondary | ICD-10-CM | POA: Diagnosis not present

## 2015-07-30 DIAGNOSIS — E119 Type 2 diabetes mellitus without complications: Secondary | ICD-10-CM | POA: Diagnosis not present

## 2015-07-30 DIAGNOSIS — N2581 Secondary hyperparathyroidism of renal origin: Secondary | ICD-10-CM | POA: Diagnosis not present

## 2015-07-30 DIAGNOSIS — D509 Iron deficiency anemia, unspecified: Secondary | ICD-10-CM | POA: Diagnosis not present

## 2015-08-01 DIAGNOSIS — D509 Iron deficiency anemia, unspecified: Secondary | ICD-10-CM | POA: Diagnosis not present

## 2015-08-01 DIAGNOSIS — Z23 Encounter for immunization: Secondary | ICD-10-CM | POA: Diagnosis not present

## 2015-08-01 DIAGNOSIS — Z992 Dependence on renal dialysis: Secondary | ICD-10-CM | POA: Diagnosis not present

## 2015-08-01 DIAGNOSIS — E119 Type 2 diabetes mellitus without complications: Secondary | ICD-10-CM | POA: Diagnosis not present

## 2015-08-01 DIAGNOSIS — E1129 Type 2 diabetes mellitus with other diabetic kidney complication: Secondary | ICD-10-CM | POA: Diagnosis not present

## 2015-08-01 DIAGNOSIS — N2581 Secondary hyperparathyroidism of renal origin: Secondary | ICD-10-CM | POA: Diagnosis not present

## 2015-08-01 DIAGNOSIS — N186 End stage renal disease: Secondary | ICD-10-CM | POA: Diagnosis not present

## 2015-08-01 DIAGNOSIS — D631 Anemia in chronic kidney disease: Secondary | ICD-10-CM | POA: Diagnosis not present

## 2015-08-01 MED ORDER — SODIUM CHLORIDE 0.45 % IV SOLN: INTRAVENOUS | Status: DC

## 2015-08-01 MED ORDER — VANCOMYCIN HCL 10 G IV SOLR
1500.0000 mg | INTRAVENOUS | Status: AC
  Administered 2015-08-02 (×2): 1500 mg via INTRAVENOUS
  Filled 2015-08-01: qty 1500

## 2015-08-01 MED ORDER — CLINDAMYCIN PHOSPHATE 900 MG/50ML IV SOLN
900.0000 mg | INTRAVENOUS | Status: AC
Start: 2015-08-02 — End: 2015-08-02
  Administered 2015-08-02: 900 mg via INTRAVENOUS
  Filled 2015-08-01 (×2): qty 50

## 2015-08-01 MED ORDER — ACETAMINOPHEN 500 MG PO TABS
1000.0000 mg | ORAL_TABLET | Freq: Once | ORAL | Status: AC
  Administered 2015-08-02: 1000 mg via ORAL
  Filled 2015-08-01: qty 2

## 2015-08-02 ENCOUNTER — Inpatient Hospital Stay (HOSPITAL_COMMUNITY): Payer: Medicare Other | Admitting: Anesthesiology

## 2015-08-02 ENCOUNTER — Inpatient Hospital Stay (HOSPITAL_COMMUNITY)
Admission: RE | Admit: 2015-08-02 | Discharge: 2015-08-05 | DRG: 469 | Disposition: A | Discharge status: Skilled Nursing Facility | Payer: Medicare Other | Source: Ambulatory Visit | Attending: Orthopedic Surgery | Admitting: Orthopedic Surgery

## 2015-08-02 ENCOUNTER — Encounter (HOSPITAL_COMMUNITY)
Admission: RE | Disposition: A | Discharge status: Skilled Nursing Facility | Payer: Self-pay | Source: Ambulatory Visit | Attending: Orthopedic Surgery

## 2015-08-02 ENCOUNTER — Inpatient Hospital Stay (HOSPITAL_COMMUNITY): Payer: Medicare Other

## 2015-08-02 ENCOUNTER — Encounter (HOSPITAL_COMMUNITY): Payer: Self-pay | Admitting: *Deleted

## 2015-08-02 ENCOUNTER — Inpatient Hospital Stay (HOSPITAL_COMMUNITY): Payer: Medicare Other | Admitting: Vascular Surgery

## 2015-08-02 DIAGNOSIS — E119 Type 2 diabetes mellitus without complications: Secondary | ICD-10-CM | POA: Diagnosis present

## 2015-08-02 DIAGNOSIS — Z8249 Family history of ischemic heart disease and other diseases of the circulatory system: Secondary | ICD-10-CM

## 2015-08-02 DIAGNOSIS — D649 Anemia, unspecified: Secondary | ICD-10-CM | POA: Diagnosis present

## 2015-08-02 DIAGNOSIS — E0500 Thyrotoxicosis with diffuse goiter without thyrotoxic crisis or storm: Secondary | ICD-10-CM | POA: Diagnosis present

## 2015-08-02 DIAGNOSIS — E785 Hyperlipidemia, unspecified: Secondary | ICD-10-CM | POA: Diagnosis present

## 2015-08-02 DIAGNOSIS — E1340 Other specified diabetes mellitus with diabetic neuropathy, unspecified: Secondary | ICD-10-CM

## 2015-08-02 DIAGNOSIS — M1711 Unilateral primary osteoarthritis, right knee: Secondary | ICD-10-CM | POA: Diagnosis not present

## 2015-08-02 DIAGNOSIS — I5042 Chronic combined systolic (congestive) and diastolic (congestive) heart failure: Secondary | ICD-10-CM | POA: Diagnosis present

## 2015-08-02 DIAGNOSIS — N186 End stage renal disease: Secondary | ICD-10-CM | POA: Diagnosis not present

## 2015-08-02 DIAGNOSIS — I252 Old myocardial infarction: Secondary | ICD-10-CM | POA: Diagnosis not present

## 2015-08-02 DIAGNOSIS — I2510 Atherosclerotic heart disease of native coronary artery without angina pectoris: Secondary | ICD-10-CM | POA: Diagnosis present

## 2015-08-02 DIAGNOSIS — M25561 Pain in right knee: Secondary | ICD-10-CM | POA: Diagnosis not present

## 2015-08-02 DIAGNOSIS — R488 Other symbolic dysfunctions: Secondary | ICD-10-CM | POA: Diagnosis not present

## 2015-08-02 DIAGNOSIS — F329 Major depressive disorder, single episode, unspecified: Secondary | ICD-10-CM | POA: Diagnosis present

## 2015-08-02 DIAGNOSIS — E1122 Type 2 diabetes mellitus with diabetic chronic kidney disease: Secondary | ICD-10-CM | POA: Diagnosis present

## 2015-08-02 DIAGNOSIS — Z881 Allergy status to other antibiotic agents status: Secondary | ICD-10-CM

## 2015-08-02 DIAGNOSIS — Z96651 Presence of right artificial knee joint: Secondary | ICD-10-CM | POA: Diagnosis not present

## 2015-08-02 DIAGNOSIS — G4733 Obstructive sleep apnea (adult) (pediatric): Secondary | ICD-10-CM | POA: Diagnosis present

## 2015-08-02 DIAGNOSIS — E1140 Type 2 diabetes mellitus with diabetic neuropathy, unspecified: Secondary | ICD-10-CM | POA: Diagnosis present

## 2015-08-02 DIAGNOSIS — N2581 Secondary hyperparathyroidism of renal origin: Secondary | ICD-10-CM | POA: Diagnosis present

## 2015-08-02 DIAGNOSIS — D631 Anemia in chronic kidney disease: Secondary | ICD-10-CM | POA: Diagnosis not present

## 2015-08-02 DIAGNOSIS — Z9181 History of falling: Secondary | ICD-10-CM | POA: Diagnosis not present

## 2015-08-02 DIAGNOSIS — Z955 Presence of coronary angioplasty implant and graft: Secondary | ICD-10-CM | POA: Diagnosis not present

## 2015-08-02 DIAGNOSIS — I255 Ischemic cardiomyopathy: Secondary | ICD-10-CM | POA: Diagnosis present

## 2015-08-02 DIAGNOSIS — Z87891 Personal history of nicotine dependence: Secondary | ICD-10-CM | POA: Diagnosis not present

## 2015-08-02 DIAGNOSIS — I132 Hypertensive heart and chronic kidney disease with heart failure and with stage 5 chronic kidney disease, or end stage renal disease: Secondary | ICD-10-CM | POA: Diagnosis present

## 2015-08-02 DIAGNOSIS — R1311 Dysphagia, oral phase: Secondary | ICD-10-CM | POA: Diagnosis not present

## 2015-08-02 DIAGNOSIS — N400 Enlarged prostate without lower urinary tract symptoms: Secondary | ICD-10-CM | POA: Diagnosis present

## 2015-08-02 DIAGNOSIS — K219 Gastro-esophageal reflux disease without esophagitis: Secondary | ICD-10-CM | POA: Diagnosis not present

## 2015-08-02 DIAGNOSIS — R2681 Unsteadiness on feet: Secondary | ICD-10-CM | POA: Diagnosis not present

## 2015-08-02 DIAGNOSIS — Z992 Dependence on renal dialysis: Secondary | ICD-10-CM | POA: Diagnosis not present

## 2015-08-02 DIAGNOSIS — F0390 Unspecified dementia without behavioral disturbance: Secondary | ICD-10-CM | POA: Diagnosis present

## 2015-08-02 DIAGNOSIS — M109 Gout, unspecified: Secondary | ICD-10-CM | POA: Diagnosis present

## 2015-08-02 DIAGNOSIS — M1990 Unspecified osteoarthritis, unspecified site: Secondary | ICD-10-CM | POA: Diagnosis not present

## 2015-08-02 DIAGNOSIS — Z96659 Presence of unspecified artificial knee joint: Secondary | ICD-10-CM

## 2015-08-02 DIAGNOSIS — Z888 Allergy status to other drugs, medicaments and biological substances status: Secondary | ICD-10-CM

## 2015-08-02 DIAGNOSIS — I5032 Chronic diastolic (congestive) heart failure: Secondary | ICD-10-CM | POA: Diagnosis not present

## 2015-08-02 DIAGNOSIS — I120 Hypertensive chronic kidney disease with stage 5 chronic kidney disease or end stage renal disease: Secondary | ICD-10-CM | POA: Diagnosis not present

## 2015-08-02 DIAGNOSIS — G8918 Other acute postprocedural pain: Secondary | ICD-10-CM | POA: Diagnosis not present

## 2015-08-02 DIAGNOSIS — I110 Hypertensive heart disease with heart failure: Secondary | ICD-10-CM | POA: Diagnosis not present

## 2015-08-02 DIAGNOSIS — M179 Osteoarthritis of knee, unspecified: Secondary | ICD-10-CM | POA: Diagnosis not present

## 2015-08-02 DIAGNOSIS — Z471 Aftercare following joint replacement surgery: Secondary | ICD-10-CM | POA: Diagnosis not present

## 2015-08-02 DIAGNOSIS — M1710 Unilateral primary osteoarthritis, unspecified knee: Secondary | ICD-10-CM | POA: Diagnosis present

## 2015-08-02 DIAGNOSIS — J309 Allergic rhinitis, unspecified: Secondary | ICD-10-CM | POA: Diagnosis present

## 2015-08-02 DIAGNOSIS — D62 Acute posthemorrhagic anemia: Secondary | ICD-10-CM | POA: Diagnosis not present

## 2015-08-02 DIAGNOSIS — M6281 Muscle weakness (generalized): Secondary | ICD-10-CM | POA: Diagnosis not present

## 2015-08-02 HISTORY — PX: TOTAL KNEE ARTHROPLASTY: SHX125

## 2015-08-02 LAB — POCT I-STAT 4, (NA,K, GLUC, HGB,HCT)
GLUCOSE: 117 mg/dL — AB (ref 65–99)
HEMATOCRIT: 38 % — AB (ref 39.0–52.0)
Hemoglobin: 12.9 g/dL — ABNORMAL LOW (ref 13.0–17.0)
Potassium: 3.9 mmol/L (ref 3.5–5.1)
Sodium: 139 mmol/L (ref 135–145)

## 2015-08-02 LAB — CREATININE, SERUM
Creatinine, Ser: 7.7 mg/dL — ABNORMAL HIGH (ref 0.61–1.24)
GFR calc Af Amer: 8 mL/min — ABNORMAL LOW (ref >60)
GFR calc non Af Amer: 6 mL/min — ABNORMAL LOW (ref >60)

## 2015-08-02 LAB — CBC
HEMATOCRIT: 35.2 % — AB (ref 39.0–52.0)
Hemoglobin: 10.6 g/dL — ABNORMAL LOW (ref 13.0–17.0)
MCH: 28 pg (ref 26.0–34.0)
MCHC: 30.1 g/dL (ref 30.0–36.0)
MCV: 92.9 fL (ref 78.0–100.0)
PLATELETS: 226 10*3/uL (ref 150–400)
RBC: 3.79 MIL/uL — ABNORMAL LOW (ref 4.22–5.81)
RDW: 18.3 % — AB (ref 11.5–15.5)
WBC: 8.7 10*3/uL (ref 4.0–10.5)

## 2015-08-02 LAB — GLUCOSE, CAPILLARY
Glucose-Capillary: 114 mg/dL — ABNORMAL HIGH (ref 65–99)
Glucose-Capillary: 94 mg/dL (ref 65–99)

## 2015-08-02 LAB — TYPE AND SCREEN
ABO/RH(D): O POS
Antibody Screen: NEGATIVE

## 2015-08-02 SURGERY — ARTHROPLASTY, KNEE, TOTAL
Anesthesia: Monitor Anesthesia Care | Site: Knee | Laterality: Right

## 2015-08-02 MED ORDER — ACETAMINOPHEN 325 MG PO TABS: 650.0000 mg | ORAL_TABLET | Freq: Four times a day (QID) | ORAL | Status: DC | PRN

## 2015-08-02 MED ORDER — ACETAMINOPHEN 650 MG RE SUPP: 650.0000 mg | Freq: Four times a day (QID) | RECTAL | Status: DC | PRN

## 2015-08-02 MED ORDER — OXYCODONE HCL 5 MG PO TABS
5.0000 mg | ORAL_TABLET | ORAL | Status: DC | PRN
  Administered 2015-08-02 – 2015-08-03 (×5): 10 mg via ORAL
  Filled 2015-08-02 (×6): qty 2

## 2015-08-02 MED ORDER — ONDANSETRON HCL 4 MG PO TABS: 4.0000 mg | ORAL_TABLET | Freq: Three times a day (TID) | ORAL | Status: DC | PRN

## 2015-08-02 MED ORDER — PREGABALIN 100 MG PO CAPS
100.0000 mg | ORAL_CAPSULE | Freq: Every day | ORAL | Status: DC | PRN
Start: 2015-08-02 — End: 2015-08-05
  Administered 2015-08-02 – 2015-08-04 (×3): 100 mg via ORAL
  Filled 2015-08-02 (×4): qty 1

## 2015-08-02 MED ORDER — OXYCODONE HCL 5 MG PO TABS
5.0000 mg | ORAL_TABLET | Freq: Once | ORAL | Status: AC | PRN
  Administered 2015-08-02: 5 mg via ORAL

## 2015-08-02 MED ORDER — EZETIMIBE 10 MG PO TABS
10.0000 mg | ORAL_TABLET | Freq: Every day | ORAL | Status: DC
Start: 2015-08-02 — End: 2015-08-05
  Administered 2015-08-02 – 2015-08-05 (×4): 10 mg via ORAL
  Filled 2015-08-02 (×4): qty 1

## 2015-08-02 MED ORDER — ONDANSETRON HCL 4 MG PO TABS: 4.0000 mg | ORAL_TABLET | Freq: Four times a day (QID) | ORAL | Status: DC | PRN

## 2015-08-02 MED ORDER — ACETAMINOPHEN 325 MG PO TABS: 325.0000 mg | ORAL_TABLET | ORAL | Status: DC | PRN

## 2015-08-02 MED ORDER — CALCIUM CARBONATE-VITAMIN D 500-200 MG-UNIT PO TABS
3.0000 | ORAL_TABLET | Freq: Three times a day (TID) | ORAL | Status: DC
  Administered 2015-08-03 (×2): 3 via ORAL
  Filled 2015-08-02 (×2): qty 3

## 2015-08-02 MED ORDER — FENTANYL CITRATE (PF) 100 MCG/2ML IJ SOLN
INTRAMUSCULAR | Status: AC
  Filled 2015-08-02: qty 4

## 2015-08-02 MED ORDER — DEXAMETHASONE SODIUM PHOSPHATE 10 MG/ML IJ SOLN
10.0000 mg | Freq: Once | INTRAMUSCULAR | Status: AC
  Administered 2015-08-03: 10 mg via INTRAVENOUS
  Filled 2015-08-02: qty 1

## 2015-08-02 MED ORDER — ACETAMINOPHEN 160 MG/5ML PO SOLN
325.0000 mg | ORAL | Status: DC | PRN
Start: 2015-08-02 — End: 2015-08-02
  Filled 2015-08-02: qty 20.3

## 2015-08-02 MED ORDER — PROPOFOL 500 MG/50ML IV EMUL
INTRAVENOUS | Status: DC | PRN
  Administered 2015-08-02: 15 ug/kg/min via INTRAVENOUS

## 2015-08-02 MED ORDER — SODIUM CHLORIDE 0.9 % IV SOLN
INTRAVENOUS | Status: DC | PRN
Start: 2015-08-02 — End: 2015-08-02
  Administered 2015-08-02: 09:00:00 via INTRAVENOUS

## 2015-08-02 MED ORDER — SODIUM CHLORIDE 0.9 % IV SOLN
INTRAVENOUS | Status: DC | PRN
  Administered 2015-08-02: 10:00:00 via INTRAVENOUS

## 2015-08-02 MED ORDER — GABAPENTIN 300 MG PO CAPS
300.0000 mg | ORAL_CAPSULE | Freq: Every day | ORAL | Status: DC
  Administered 2015-08-02 – 2015-08-04 (×3): 300 mg via ORAL
  Filled 2015-08-02 (×3): qty 1

## 2015-08-02 MED ORDER — ONDANSETRON HCL 4 MG/2ML IJ SOLN
INTRAMUSCULAR | Status: DC | PRN
  Administered 2015-08-02: 4 mg via INTRAVENOUS

## 2015-08-02 MED ORDER — CARVEDILOL 12.5 MG PO TABS
12.5000 mg | ORAL_TABLET | Freq: Two times a day (BID) | ORAL | Status: DC
  Administered 2015-08-03 – 2015-08-04 (×3): 12.5 mg via ORAL
  Filled 2015-08-02 (×4): qty 1

## 2015-08-02 MED ORDER — DEXTROSE 5 % IV SOLN
500.0000 mg | Freq: Four times a day (QID) | INTRAVENOUS | Status: DC | PRN
  Administered 2015-08-02: 500 mg via INTRAVENOUS
  Filled 2015-08-02: qty 5

## 2015-08-02 MED ORDER — MENTHOL 3 MG MT LOZG: 1.0000 | LOZENGE | OROMUCOSAL | Status: DC | PRN

## 2015-08-02 MED ORDER — OXYCODONE HCL 5 MG/5ML PO SOLN: 5.0000 mg | Freq: Once | ORAL | Status: AC | PRN

## 2015-08-02 MED ORDER — OXYCODONE HCL 5 MG PO TABS
ORAL_TABLET | ORAL | Status: AC
  Filled 2015-08-02: qty 2

## 2015-08-02 MED ORDER — SODIUM CHLORIDE 0.9 % IR SOLN
Status: DC | PRN
  Administered 2015-08-02: 1000 mL

## 2015-08-02 MED ORDER — SODIUM CHLORIDE 0.9 % IV SOLN
Freq: Once | INTRAVENOUS | Status: AC
  Administered 2015-08-02: 10:00:00 via INTRAVENOUS

## 2015-08-02 MED ORDER — CELECOXIB 200 MG PO CAPS
ORAL_CAPSULE | ORAL | Status: AC
Start: 2015-08-02 — End: 2015-08-03
  Filled 2015-08-02: qty 1

## 2015-08-02 MED ORDER — MIDAZOLAM HCL 2 MG/2ML IJ SOLN
INTRAMUSCULAR | Status: AC
  Filled 2015-08-02: qty 2

## 2015-08-02 MED ORDER — PHENYLEPHRINE 40 MCG/ML (10ML) SYRINGE FOR IV PUSH (FOR BLOOD PRESSURE SUPPORT)
PREFILLED_SYRINGE | INTRAVENOUS | Status: AC
  Filled 2015-08-02: qty 30

## 2015-08-02 MED ORDER — NATEGLINIDE 60 MG PO TABS
60.0000 mg | ORAL_TABLET | Freq: Three times a day (TID) | ORAL | Status: DC
  Administered 2015-08-03 – 2015-08-05 (×6): 60 mg via ORAL
  Filled 2015-08-02 (×10): qty 1

## 2015-08-02 MED ORDER — CINACALCET HCL 30 MG PO TABS
60.0000 mg | ORAL_TABLET | Freq: Two times a day (BID) | ORAL | Status: DC
  Administered 2015-08-03 – 2015-08-05 (×5): 60 mg via ORAL
  Filled 2015-08-02 (×6): qty 2

## 2015-08-02 MED ORDER — FENTANYL CITRATE (PF) 100 MCG/2ML IJ SOLN
25.0000 ug | INTRAMUSCULAR | Status: AC | PRN
  Administered 2015-08-02 (×2): 25 ug via INTRAVENOUS
  Administered 2015-08-02 (×4): 50 ug via INTRAVENOUS

## 2015-08-02 MED ORDER — CLINDAMYCIN PHOSPHATE 600 MG/50ML IV SOLN
INTRAVENOUS | Status: AC
Start: 2015-08-02 — End: 2015-08-03
  Filled 2015-08-02: qty 50

## 2015-08-02 MED ORDER — CLINDAMYCIN PHOSPHATE 600 MG/50ML IV SOLN
600.0000 mg | Freq: Four times a day (QID) | INTRAVENOUS | Status: AC
  Administered 2015-08-02 (×2): 600 mg via INTRAVENOUS
  Filled 2015-08-02: qty 50

## 2015-08-02 MED ORDER — HYDROMORPHONE HCL 1 MG/ML IJ SOLN
1.0000 mg | INTRAMUSCULAR | Status: DC | PRN
  Administered 2015-08-02 – 2015-08-03 (×4): 1 mg via INTRAVENOUS
  Filled 2015-08-02 (×4): qty 1

## 2015-08-02 MED ORDER — DOCUSATE SODIUM 100 MG PO CAPS
100.0000 mg | ORAL_CAPSULE | Freq: Two times a day (BID) | ORAL | Status: DC
  Administered 2015-08-02 – 2015-08-05 (×6): 100 mg via ORAL
  Filled 2015-08-02 (×5): qty 1

## 2015-08-02 MED ORDER — ENOXAPARIN SODIUM 100 MG/ML ~~LOC~~ SOLN: 30.0000 mg | SUBCUTANEOUS | Status: DC

## 2015-08-02 MED ORDER — ENOXAPARIN SODIUM 30 MG/0.3ML ~~LOC~~ SOLN
30.0000 mg | SUBCUTANEOUS | Status: DC
  Administered 2015-08-03 – 2015-08-05 (×3): 30 mg via SUBCUTANEOUS
  Filled 2015-08-02 (×3): qty 0.3

## 2015-08-02 MED ORDER — FENTANYL CITRATE (PF) 100 MCG/2ML IJ SOLN
INTRAMUSCULAR | Status: AC
  Filled 2015-08-02: qty 2

## 2015-08-02 MED ORDER — DEXTROSE-NACL 5-0.45 % IV SOLN: INTRAVENOUS | Status: AC

## 2015-08-02 MED ORDER — HYDROMORPHONE HCL 1 MG/ML IJ SOLN
INTRAMUSCULAR | Status: AC
  Filled 2015-08-02: qty 1

## 2015-08-02 MED ORDER — FENTANYL CITRATE (PF) 250 MCG/5ML IJ SOLN
INTRAMUSCULAR | Status: AC
  Filled 2015-08-02: qty 5

## 2015-08-02 MED ORDER — ONDANSETRON HCL 4 MG/2ML IJ SOLN
INTRAMUSCULAR | Status: AC
  Filled 2015-08-02: qty 2

## 2015-08-02 MED ORDER — RENA-VITE PO TABS
1.0000 | ORAL_TABLET | Freq: Every day | ORAL | Status: DC
  Administered 2015-08-03 – 2015-08-05 (×3): 1 via ORAL
  Filled 2015-08-02 (×3): qty 1

## 2015-08-02 MED ORDER — METHOCARBAMOL 1000 MG/10ML IJ SOLN
500.0000 mg | INTRAMUSCULAR | Status: AC
  Administered 2015-08-02: 500 mg via INTRAVENOUS
  Filled 2015-08-02: qty 5

## 2015-08-02 MED ORDER — MIDAZOLAM HCL 2 MG/2ML IJ SOLN
INTRAMUSCULAR | Status: AC
  Filled 2015-08-02: qty 4

## 2015-08-02 MED ORDER — DOCUSATE SODIUM 100 MG PO CAPS: 100.0000 mg | ORAL_CAPSULE | Freq: Two times a day (BID) | ORAL | Status: DC

## 2015-08-02 MED ORDER — ALLOPURINOL 100 MG PO TABS
100.0000 mg | ORAL_TABLET | Freq: Every day | ORAL | Status: DC
  Administered 2015-08-02 – 2015-08-05 (×4): 100 mg via ORAL
  Filled 2015-08-02 (×4): qty 1

## 2015-08-02 MED ORDER — LANTHANUM CARBONATE 500 MG PO CHEW
1000.0000 mg | CHEWABLE_TABLET | Freq: Two times a day (BID) | ORAL | Status: DC
  Administered 2015-08-03 – 2015-08-05 (×5): 1000 mg via ORAL
  Filled 2015-08-02 (×5): qty 2

## 2015-08-02 MED ORDER — BUPIVACAINE IN DEXTROSE 0.75-8.25 % IT SOLN
INTRATHECAL | Status: DC | PRN
  Administered 2015-08-02: 2 mL via INTRATHECAL

## 2015-08-02 MED ORDER — METOCLOPRAMIDE HCL 5 MG/ML IJ SOLN: 5.0000 mg | Freq: Three times a day (TID) | INTRAMUSCULAR | Status: DC | PRN

## 2015-08-02 MED ORDER — GABAPENTIN 300 MG PO CAPS
300.0000 mg | ORAL_CAPSULE | Freq: Once | ORAL | Status: AC
  Administered 2015-08-02: 300 mg via ORAL
  Filled 2015-08-02 (×2): qty 1

## 2015-08-02 MED ORDER — OXYCODONE HCL 5 MG PO TABS
ORAL_TABLET | ORAL | Status: AC
  Filled 2015-08-02: qty 1

## 2015-08-02 MED ORDER — MIDAZOLAM HCL 5 MG/5ML IJ SOLN
INTRAMUSCULAR | Status: DC | PRN
  Administered 2015-08-02: 2 mg via INTRAVENOUS

## 2015-08-02 MED ORDER — PANTOPRAZOLE SODIUM 40 MG PO TBEC
40.0000 mg | DELAYED_RELEASE_TABLET | Freq: Every day | ORAL | Status: DC
  Administered 2015-08-02 – 2015-08-05 (×4): 40 mg via ORAL
  Filled 2015-08-02 (×4): qty 1

## 2015-08-02 MED ORDER — FENTANYL CITRATE (PF) 100 MCG/2ML IJ SOLN
100.0000 ug | Freq: Once | INTRAMUSCULAR | Status: DC
  Filled 2015-08-02: qty 2

## 2015-08-02 MED ORDER — AMLODIPINE BESYLATE 10 MG PO TABS
10.0000 mg | ORAL_TABLET | Freq: Every day | ORAL | Status: DC
  Administered 2015-08-02 – 2015-08-03 (×2): 10 mg via ORAL
  Filled 2015-08-02 (×3): qty 1

## 2015-08-02 MED ORDER — ONDANSETRON HCL 4 MG/2ML IJ SOLN: 4.0000 mg | Freq: Four times a day (QID) | INTRAMUSCULAR | Status: DC | PRN

## 2015-08-02 MED ORDER — OXYCODONE-ACETAMINOPHEN 5-325 MG PO TABS: 1.0000 | ORAL_TABLET | ORAL | Status: DC | PRN

## 2015-08-02 MED ORDER — METHOCARBAMOL 500 MG PO TABS
500.0000 mg | ORAL_TABLET | Freq: Four times a day (QID) | ORAL | Status: DC | PRN
  Administered 2015-08-02 – 2015-08-04 (×4): 500 mg via ORAL
  Filled 2015-08-02 (×4): qty 1

## 2015-08-02 MED ORDER — POLYETHYLENE GLYCOL 3350 17 G PO PACK: 17.0000 g | PACK | Freq: Every day | ORAL | Status: DC | PRN

## 2015-08-02 MED ORDER — CELECOXIB 200 MG PO CAPS
200.0000 mg | ORAL_CAPSULE | Freq: Two times a day (BID) | ORAL | Status: DC
  Administered 2015-08-02 – 2015-08-05 (×6): 200 mg via ORAL
  Filled 2015-08-02 (×6): qty 1

## 2015-08-02 MED ORDER — LIDOCAINE HCL (CARDIAC) 20 MG/ML IV SOLN
INTRAVENOUS | Status: AC
  Filled 2015-08-02: qty 5

## 2015-08-02 MED ORDER — FENTANYL CITRATE (PF) 100 MCG/2ML IJ SOLN
INTRAMUSCULAR | Status: AC
  Administered 2015-08-02: 100 ug
  Filled 2015-08-02: qty 2

## 2015-08-02 MED ORDER — COLESEVELAM HCL 625 MG PO TABS
1875.0000 mg | ORAL_TABLET | Freq: Two times a day (BID) | ORAL | Status: DC
  Administered 2015-08-03 – 2015-08-05 (×5): 1875 mg via ORAL
  Filled 2015-08-02 (×7): qty 3

## 2015-08-02 MED ORDER — PHENYLEPHRINE HCL 10 MG/ML IJ SOLN
INTRAMUSCULAR | Status: DC | PRN
  Administered 2015-08-02 (×5): 80 ug via INTRAVENOUS
  Administered 2015-08-02: 40 ug via INTRAVENOUS
  Administered 2015-08-02 (×2): 80 ug via INTRAVENOUS

## 2015-08-02 MED ORDER — PHENOL 1.4 % MT LIQD: 1.0000 | OROMUCOSAL | Status: DC | PRN

## 2015-08-02 MED ORDER — BUPIVACAINE-EPINEPHRINE (PF) 0.5% -1:200000 IJ SOLN
INTRAMUSCULAR | Status: DC | PRN
  Administered 2015-08-02: 25 mL via PERINEURAL

## 2015-08-02 MED ORDER — FENTANYL CITRATE (PF) 100 MCG/2ML IJ SOLN
INTRAMUSCULAR | Status: DC | PRN
  Administered 2015-08-02: 50 ug via INTRAVENOUS

## 2015-08-02 MED ORDER — METOCLOPRAMIDE HCL 5 MG PO TABS: 5.0000 mg | ORAL_TABLET | Freq: Three times a day (TID) | ORAL | Status: DC | PRN

## 2015-08-02 SURGICAL SUPPLY — 56 items
BANDAGE ESMARK 6X9 LF (GAUZE/BANDAGES/DRESSINGS) ×1 IMPLANT
BLADE SAG 18X100X1.27 (BLADE) ×3 IMPLANT
BNDG COHESIVE 6X5 TAN STRL LF (GAUZE/BANDAGES/DRESSINGS) ×3 IMPLANT
BNDG ESMARK 6X9 LF (GAUZE/BANDAGES/DRESSINGS) ×3
BOWL SMART MIX CTS (DISPOSABLE) ×3 IMPLANT
CAPT KNEE TOTAL 3 ×3 IMPLANT
CEMENT BONE SIMPLEX SPEEDSET (Cement) ×6 IMPLANT
CLOSURE STERI-STRIP 1/2X4 (GAUZE/BANDAGES/DRESSINGS) ×1
CLSR STERI-STRIP ANTIMIC 1/2X4 (GAUZE/BANDAGES/DRESSINGS) ×2 IMPLANT
COVER SURGICAL LIGHT HANDLE (MISCELLANEOUS) ×3 IMPLANT
CUFF TOURNIQUET SINGLE 34IN LL (TOURNIQUET CUFF) ×3 IMPLANT
DRAPE EXTREMITY T 121X128X90 (DRAPE) ×3 IMPLANT
DRAPE U-SHAPE 47X51 STRL (DRAPES) ×3 IMPLANT
DRSG ADAPTIC 3X8 NADH LF (GAUZE/BANDAGES/DRESSINGS) ×3 IMPLANT
DRSG MEPILEX BORDER 4X8 (GAUZE/BANDAGES/DRESSINGS) ×3 IMPLANT
DURAPREP 26ML APPLICATOR (WOUND CARE) ×6 IMPLANT
ELECT CAUTERY BLADE 6.4 (BLADE) ×3 IMPLANT
ELECT REM PT RETURN 9FT ADLT (ELECTROSURGICAL) ×3
ELECTRODE REM PT RTRN 9FT ADLT (ELECTROSURGICAL) ×1 IMPLANT
FACESHIELD WRAPAROUND (MASK) ×6 IMPLANT
GAUZE SPONGE 4X4 12PLY STRL (GAUZE/BANDAGES/DRESSINGS) ×3 IMPLANT
GLOVE BIO SURGEON STRL SZ7 (GLOVE) ×3 IMPLANT
GLOVE BIO SURGEON STRL SZ7.5 (GLOVE) ×3 IMPLANT
GLOVE BIOGEL PI IND STRL 7.0 (GLOVE) ×1 IMPLANT
GLOVE BIOGEL PI IND STRL 8 (GLOVE) ×1 IMPLANT
GLOVE BIOGEL PI INDICATOR 7.0 (GLOVE) ×2
GLOVE BIOGEL PI INDICATOR 8 (GLOVE) ×2
GOWN STRL REUS W/ TWL LRG LVL3 (GOWN DISPOSABLE) ×2 IMPLANT
GOWN STRL REUS W/ TWL XL LVL3 (GOWN DISPOSABLE) IMPLANT
GOWN STRL REUS W/TWL LRG LVL3 (GOWN DISPOSABLE) ×4
GOWN STRL REUS W/TWL XL LVL3 (GOWN DISPOSABLE)
HANDPIECE INTERPULSE COAX TIP (DISPOSABLE) ×2
IMMOBILIZER KNEE 22 UNIV (SOFTGOODS) ×3 IMPLANT
IMMOBILIZER KNEE 24 THIGH 36 (MISCELLANEOUS) IMPLANT
IMMOBILIZER KNEE 24 UNIV (MISCELLANEOUS)
KIT BASIN OR (CUSTOM PROCEDURE TRAY) ×3 IMPLANT
KIT ROOM TURNOVER OR (KITS) ×3 IMPLANT
LIQUID BAND (GAUZE/BANDAGES/DRESSINGS) ×3 IMPLANT
MANIFOLD NEPTUNE II (INSTRUMENTS) ×3 IMPLANT
NS IRRIG 1000ML POUR BTL (IV SOLUTION) ×3 IMPLANT
PACK TOTAL JOINT (CUSTOM PROCEDURE TRAY) ×3 IMPLANT
PACK UNIVERSAL I (CUSTOM PROCEDURE TRAY) ×3 IMPLANT
PAD ARMBOARD 7.5X6 YLW CONV (MISCELLANEOUS) ×3 IMPLANT
SET HNDPC FAN SPRY TIP SCT (DISPOSABLE) ×1 IMPLANT
STAPLER VISISTAT 35W (STAPLE) IMPLANT
SUCTION FRAZIER TIP 10 FR DISP (SUCTIONS) ×3 IMPLANT
SUT MNCRL AB 4-0 PS2 18 (SUTURE) ×3 IMPLANT
SUT MON AB 2-0 CT1 27 (SUTURE) ×6 IMPLANT
SUT MON AB 2-0 CT1 36 (SUTURE) ×3 IMPLANT
SUT VIC AB 0 CT1 27 (SUTURE) ×2
SUT VIC AB 0 CT1 27XBRD ANBCTR (SUTURE) ×1 IMPLANT
SUT VIC AB 1 CTX 36 (SUTURE) ×2
SUT VIC AB 1 CTX36XBRD ANBCTR (SUTURE) ×1 IMPLANT
TOWEL OR 17X24 6PK STRL BLUE (TOWEL DISPOSABLE) ×3 IMPLANT
TOWEL OR 17X26 10 PK STRL BLUE (TOWEL DISPOSABLE) ×3 IMPLANT
TRAY FOLEY BAG SILVER LF 14FR (CATHETERS) IMPLANT

## 2015-08-02 NOTE — Discharge Instructions (Signed)

## 2015-08-02 NOTE — Anesthesia Preprocedure Evaluation (Addendum)
Anesthesia Evaluation  Patient identified by MRN, date of birth, ID band Patient awake    Reviewed: Allergy & Precautions, NPO status , Patient's Chart, lab work & pertinent test results, reviewed documented beta blocker date and time   History of Anesthesia Complications Negative for: history of anesthetic complications  Airway Mallampati: II  TM Distance: >3 FB Neck ROM: Full    Dental  (+) Missing, Poor Dentition   Pulmonary sleep apnea and Continuous Positive Airway Pressure Ventilation , former smoker,    breath sounds clear to auscultation       Cardiovascular hypertension, Pt. on medications + CAD, + Past MI and +CHF   Rhythm:Regular     Neuro/Psych PSYCHIATRIC DISORDERS Depression    GI/Hepatic GERD  ,  Endo/Other  diabetes  Renal/GU ESRF and DialysisRenal disease     Musculoskeletal   Abdominal   Peds  Hematology  (+) anemia ,   Anesthesia Other Findings   Reproductive/Obstetrics                           Anesthesia Physical Anesthesia Plan  ASA: III  Anesthesia Plan: Spinal   Post-op Pain Management: MAC Combined w/ Regional for Post-op pain   Induction:   Airway Management Planned: Natural Airway, Nasal Cannula and Simple Face Mask  Additional Equipment:   Intra-op Plan:   Post-operative Plan:   Informed Consent: I have reviewed the patients History and Physical, chart, labs and discussed the procedure including the risks, benefits and alternatives for the proposed anesthesia with the patient or authorized representative who has indicated his/her understanding and acceptance.   Dental advisory given  Plan Discussed with: Surgeon and CRNA  Anesthesia Plan Comments:       Anesthesia Quick Evaluation

## 2015-08-02 NOTE — Progress Notes (Signed)
Utilization review completed.  

## 2015-08-02 NOTE — Op Note (Signed)
DATE OF SURGERY:  08/02/2015 TIME: 12:17 PM  PATIENT NAME:  [name]   AGE: 64 y.o.    PRE-OPERATIVE DIAGNOSIS:  OA RIGHT KNEE  POST-OPERATIVE DIAGNOSIS:  Same  PROCEDURE:  Procedure(s): TOTAL KNEE ARTHROPLASTY   SURGEON:  Enriqueta Augusta D, MD   ASSISTANT:  Lovett Calender, PA-C, She was present and scrubbed throughout the case, critical for completion in a timely fashion, and for retraction, instrumentation, and closure.    OPERATIVE IMPLANTS: Stryker Triathlon Posterior Stabilized.  Femur size 5, Tibia size 6, Patella size 38 3-peg oval button, with a 9 mm polyethylene insert.   PREOPERATIVE INDICATIONS:  [name] is a 64 y.o. year old male with end stage bone on bone degenerative arthritis of the knee who failed conservative treatment, including injections, antiinflammatories, activity modification, and assistive devices, and had significant impairment of their activities of daily living, and elected for Total Knee Arthroplasty.   The risks, benefits, and alternatives were discussed at length including but not limited to the risks of infection, bleeding, nerve injury, stiffness, blood clots, the need for revision surgery, cardiopulmonary complications, among others, and they were willing to proceed.   OPERATIVE DESCRIPTION:  The patient was brought to the operative room and placed in a supine position.  General anesthesia was administered.  IV antibiotics were given.  The lower extremity was prepped and draped in the usual sterile fashion.  Time out was performed.  The leg was elevated and exsanguinated and the tourniquet was inflated.  Anterior approach was performed.  The patella was everted and osteophytes were removed.  The anterior horn of the medial and lateral meniscus was removed.   The distal femur was opened with the drill and the intramedullary distal femoral cutting jig was utilized, set at 5 degrees resecting 10 mm off the distal femur.  Care was taken to  protect the collateral ligaments.  The distal femoral sizing jig was applied, taking care to avoid notching.  Then the 4-in-1 cutting jig was applied and the anterior and posterior femur was cut, along with the chamfer cuts.  All posterior osteophytes were removed.  The flexion gap was then measured and was symmetric with the extension gap.  Then the extramedullary tibial cutting jig was utilized making the appropriate cut using the anterior tibial crest as a reference building in appropriate posterior slope.  Care was taken during the cut to protect the medial and collateral ligaments.  The proximal tibia was removed along with the posterior horns of the menisci.  The PCL was sacrificed.    The extensor gap was measured and was approximately 70mm.    I completed the distal femoral preparation using the appropriate jig to prepare the box.  The patella was then measured, and cut with the saw.    The proximal tibia sized and prepared accordingly with the reamer and the punch, and then all components were trialed with the 67mm poly insert.  The knee was found to have excellent balance and full motion.    The above named components were then cemented into place and all excess cement was removed.  The real polyethylene implant was placed.  The knee was easily taken through a range of motion and the patella tracked well and the knee irrigated copiously and the parapatellar and subcutaneous tissue closed with vicryl, and monocryl with steri strips for the skin.  The wounds were dressed with sterile gauze and the tourniquet released and the patient was awakened and returned to  the PACU in stable and satisfactory condition.  There were no complications.  Total tourniquet time was 90 minutes.   POSTOPERATIVE PLAN: post op Abx, DVT px: SCD's, TED's, Early ambulation and chemical px.   This note was generated using a template and dragon dictation system. In light of that, I have reviewed the note and all  aspects of it are applicable to this case. Any dictation errors are due to the computerized dictation system.

## 2015-08-02 NOTE — Transfer of Care (Signed)
Immediate Anesthesia Transfer of Care Note  Patient: [name]  Procedure(s) Performed: Procedure(s): TOTAL KNEE ARTHROPLASTY (Right)  Patient Location: PACU  Anesthesia Type:MAC and Regional  Level of Consciousness: awake, alert  and oriented  Airway & Oxygen Therapy: Patient Spontanous Breathing  Post-op Assessment: Report given to RN and Post -op Vital signs reviewed and stable  Post vital signs: Reviewed and stable  Last Vitals:  Filed Vitals:   08/02/15 0806  BP: 126/72  Pulse: 78  Temp: 0000000 C    Complications: No apparent anesthesia complications

## 2015-08-02 NOTE — Progress Notes (Signed)
Orthopedic Tech Progress Note Patient Details:  [name] Apr 30, 1951 [id number]  CPM Right Knee CPM Right Knee: On Right Knee Flexion (Degrees): 90 Right Knee Extension (Degrees): 0 Additional Comments: Foot roll   [place] Pink 08/02/2015, 2:17 PM

## 2015-08-02 NOTE — Anesthesia Procedure Notes (Signed)
Anesthesia Regional Block:  Femoral nerve block  Pre-Anesthetic Checklist: ,, timeout performed, Correct Patient, Correct Site, Correct Laterality, Correct Procedure, Correct Position, site marked, Risks and benefits discussed,  Surgical consent,  Pre-op evaluation,  At surgeon's request and post-op pain management  Laterality: Lower and Right  Prep: chloraprep       Needles:  Injection technique: Single-shot  Needle Type: Echogenic Stimulator Needle          Additional Needles:  Procedures: ultrasound guided (picture in chart) and nerve stimulator Femoral nerve block  Nerve Stimulator or Paresthesia:  Response: quad, 0.5 mA,   Additional Responses:   Narrative:  Injection made incrementally with aspirations every 5 mL.  Performed by: Personally  Anesthesiologist: Anaysia Germer  Additional Notes: H+P and labs reviewed, risks and benefits discussed with patient, procedure tolerated well without complications   Spinal Patient location during procedure: OB Staffing Anesthesiologist: Emilyann Banka Preanesthetic Checklist Completed: patient identified, surgical consent, pre-op evaluation, timeout performed, IV checked, risks and benefits discussed and monitors and equipment checked Spinal Block Patient position: sitting Prep: site prepped and draped and DuraPrep Patient monitoring: heart rate, cardiac monitor, continuous pulse ox and blood pressure Approach: right paramedian Location: L3-4 Injection technique: single-shot Needle Needle type: Pencan  Needle gauge: 24 G Needle length: 10 cm Assessment Sensory level: T6

## 2015-08-02 NOTE — Progress Notes (Signed)
Orthopedic Tech Progress Note Patient Details:  MYAN VANELLA [birth year] [id number]  Patient ID: [name], male   DOB: [birth year], 64 y.o.   MRN: [id number]   [place] Pink 08/02/2015, 6:16 PMTrapeze bar

## 2015-08-03 ENCOUNTER — Encounter (HOSPITAL_COMMUNITY): Payer: Self-pay | Admitting: Orthopedic Surgery

## 2015-08-03 MED ORDER — OXYCODONE HCL 5 MG PO TABS
5.0000 mg | ORAL_TABLET | ORAL | Status: DC
  Administered 2015-08-03 – 2015-08-04 (×3): 5 mg via ORAL
  Administered 2015-08-04: 10 mg via ORAL
  Administered 2015-08-04 (×3): 5 mg via ORAL
  Administered 2015-08-05 (×2): 10 mg via ORAL
  Filled 2015-08-03 (×4): qty 1
  Filled 2015-08-03: qty 2
  Filled 2015-08-03 (×2): qty 1
  Filled 2015-08-03 (×3): qty 2

## 2015-08-03 MED ORDER — DOXERCALCIFEROL 4 MCG/2ML IV SOLN
5.0000 ug | INTRAVENOUS | Status: DC
  Administered 2015-08-04: 5 ug via INTRAVENOUS
  Filled 2015-08-03: qty 4

## 2015-08-03 MED ORDER — CALCIUM CARBONATE ANTACID 500 MG PO CHEW
2.0000 | CHEWABLE_TABLET | Freq: Three times a day (TID) | ORAL | Status: DC
  Administered 2015-08-03 – 2015-08-05 (×4): 400 mg via ORAL
  Filled 2015-08-03 (×4): qty 2

## 2015-08-03 NOTE — Evaluation (Signed)
Occupational Therapy Evaluation Patient Details Name: [name] MRN: [id number] DOB: Jun 21, 1951 Today's Date: 08/03/2015    History of Present Illness Pt presents for right TKA, PMH of OA, CAD, ESRD on HD, gait disturbance, GI bleed, dementia, dizziness    Clinical Impression   Patient presenting with deconditioning, decreased I in self care, decreased I in functional transfers/mobility. Patient reports being Mod I with self care PTA. Patient currently functioning set up - max A for self care tasks. Patient will benefit from acute OT to increase overall independence in the areas of ADLs, functional mobility, safety in order to safely discharge to next venue of care.    Follow Up Recommendations  SNF;Supervision/Assistance - 24 hour;Other (comment) (Currently recommendation is SNF but pending pt progress HHOT. with 24/7 supervision/assist.)    Equipment Recommendations  3 in 1 bedside comode    Recommendations for Other Services       Precautions / Restrictions Precautions Precautions: Fall;Knee Precaution Booklet Issued: No Precaution Comments: pt tends to externally rotate right hip, reviewed proper positioning making sure that knee was truly in extension with toes pointing upward Restrictions Weight Bearing Restrictions: Yes RLE Weight Bearing: Weight bearing as tolerated      Mobility Bed Mobility Overal bed mobility: Needs Assistance             General bed mobility comments: Pt seated in recliner chair upon entering the room.  Transfers Overall transfer level: Needs assistance Equipment used: Rolling walker (2 wheeled) Transfers: Sit to/from Stand Sit to Stand: Min assist         General transfer comment: Min A to stand from recliner chair this session. Movement taking increased time as pt very fearful of falling and anxious with movement. OT providing reassurance and encouragment. Pt having difficulty coming to full stand and B knees observed to be  buckling as well.     Balance Overall balance assessment: History of Falls;Needs assistance Sitting-balance support: Feet supported;No upper extremity supported Sitting balance-Leahy Scale: Good     Standing balance support: Bilateral upper extremity supported Standing balance-Leahy Scale: Poor Standing balance comment: reliance on RW                            ADL Overall ADL's : Needs assistance/impaired             Lower Body Bathing: Sit to/from stand;Maximal assistance       Lower Body Dressing: Maximal assistance;Sit to/from stand   Toilet Transfer: Moderate assistance;RW;BSC;Stand-pivot;Comfort height toilet   Toileting- Clothing Manipulation and Hygiene: Total assistance;Sit to/from stand Toileting - Clothing Manipulation Details (indicate cue type and reason): for clothing management and hygiene       General ADL Comments: Pt very anxious about sit <>stand this session. OT educated and demonstrated pushing through B UEs and anterior weight shift in order to stand. Pt returned demonstration with min A but unable to come to full standing. R KI donned this session for comfort and OT observed B knees buckling this session . Pt marching in place x 10 reps on each side with  RW and min A for balance. Pt  began sitting without warning secondary to fatigue but recliner chair directly behind it. Pt able to don/doff L sock but required partial assist for R sock.      Vision     Perception     Praxis      Pertinent Vitals/Pain Pain Assessment: 0-10 Pain Score:  7  Pain Location: right knee Pain Descriptors / Indicators: Burning;Aching Pain Intervention(s): Limited activity within patient's tolerance;Premedicated before session;Repositioned     Hand Dominance Left   Extremity/Trunk Assessment Upper Extremity Assessment Upper Extremity Assessment: Generalized weakness;LUE deficits/detail LUE Deficits / Details: 3-/5 gross shoulder strength   Lower  Extremity Assessment Lower Extremity Assessment: Defer to PT evaluation   Cervical / Trunk Assessment Cervical / Trunk Assessment: Kyphotic   Communication Communication Communication: No difficulties   Cognition Arousal/Alertness: Awake/alert Behavior During Therapy: Flat affect Overall Cognitive Status: Impaired/Different from baseline Area of Impairment: Following commands;Problem solving       Following Commands: Follows one step commands consistently;Follows multi-step commands inconsistently;Follows one step commands with increased time     Problem Solving: Slow processing;Decreased initiation;Requires verbal cues;Requires tactile cues General Comments: pt more alert second session and less interally distracted, still silghtly lethargic and wife reports he is not his normal self yet. Slow processing   General Comments       Exercises Exercises: Total Joint     Shoulder Instructions      Home Living Family/patient expects to be discharged to:: Private residence Living Arrangements: Spouse/significant other Available Help at Discharge: Family;Available 24 hours/day Type of Home: House Home Access: Stairs to enter [employer] of Steps: 5 Entrance Stairs-Rails: Right Home Layout: One level     Bathroom Shower/Tub: Walk-in Hydrologist: Standard Bathroom Accessibility: Yes How Accessible: Accessible via walker Home Equipment: Dixon - 2 wheels;Cane - single point   Additional Comments: pt lives with wife, has other family support as well      Prior Functioning/Environment Level of Independence: Independent with assistive device(s)        Comments: uses RW, has h/o falls    OT Diagnosis: Acute pain;Generalized weakness   OT Problem List: Decreased strength;Decreased knowledge of use of DME or AE;Increased edema;Decreased range of motion;Decreased activity tolerance;Impaired balance (sitting and/or standing);Impaired  sensation;Pain   OT Treatment/Interventions: Self-care/ADL training;Therapeutic exercise;Energy conservation;DME and/or AE instruction;Patient/family education;Therapeutic activities;Balance training    OT Goals(Current goals can be found in the care plan section) Acute Rehab OT Goals Patient Stated Goal: return home OT Goal Formulation: With patient/family Time For Goal Achievement: 08/17/15 Potential to Achieve Goals: Fair ADL Goals Pt Will Perform Upper Body Bathing: with modified independence;sitting Pt Will Perform Lower Body Bathing: sit to/from stand;with min guard assist Pt Will Perform Upper Body Dressing: with modified independence;sitting Pt Will Perform Lower Body Dressing: with min guard assist;sit to/from stand;with adaptive equipment Pt Will Transfer to Toilet: with min guard assist;stand pivot transfer;bedside commode;ambulating Pt Will Perform Toileting - Clothing Manipulation and hygiene: with min guard assist;sit to/from stand;with adaptive equipment Pt Will Perform Tub/Shower Transfer: with min guard assist;ambulating;shower seat;rolling walker;Stand pivot transfer  OT Frequency: Min 2X/week   Barriers to D/C: Other (comment)  none known at this time          End of Session Equipment Utilized During Treatment: Rolling walker CPM Right Knee CPM Right Knee: Off  Activity Tolerance: Patient limited by lethargy;Patient limited by fatigue;Patient limited by pain Patient left: in chair;with chair alarm set;with family/visitor present;with call bell/phone within reach   Time: [id number] OT Time Calculation (min): 29 min Charges:  OT General Charges $OT Visit: 1 Procedure OT Evaluation $Initial OT Evaluation Tier I: 1 Procedure OT Treatments $Self Care/Home Management : 8-22 mins  Phineas Semen, MS, OTR/L 08/03/2015, 2:01 PM

## 2015-08-03 NOTE — Care Management Note (Signed)
Case Management Note  Patient Details  Name: EMERSYN OSTERGARD MRN: [id number] Date of Birth: 10/10/1950  Subjective/Objective:     64 yr old male s/p right total knee arthroplasty.               Action/Plan: Patient was preoperatively setup with [facility]. Patient may require shortterm rehab at SNF, should that be the case He will go to [facility], per Joseph Art, [place]. Case Manager  Will continue to assess.  Expected Discharge Date:    08/04/15              Expected Discharge Plan:   to be determined  In-House Referral:  Clinical Social Work  Discharge planning Services  CM Consult  Post Acute Care Choice:  Home Health Choice offered to:     DME Arranged:  CPM DME Agency:  [employer]  HH Arranged:  PT [place] Agency:  [place]  Status of Service:  In process, will continue to follow  Medicare Important Message Given:    Date Medicare IM Given:    Medicare IM give by:    Date Additional Medicare IM Given:    Additional Medicare Important Message give by:     If discussed at [place] of Stay Meetings, dates discussed:    Additional Comments:  Ninfa Meeker, RN 08/03/2015, 3:30 PM

## 2015-08-03 NOTE — Progress Notes (Signed)
Spoke with the nursing staff concerning the patient's pain control.  I believe that the patient was very lethargic this morning from a large amount of narcotic medication last night and poor kidney function.  I would like to use the oxycodone 5mg  every 4 hrs to stay on top of pain and avoid IV pain medication.  Patient has required only one dose of pain medication today and has done well with both PT/OT.  Nursing is on board with plan.   Lovett Calender PA-C [phone]

## 2015-08-03 NOTE — Consult Note (Signed)
[facility] Renal Consultation Note    Indication for Consultation:  Management of ESRD/hemodialysis; anemia, hypertension/volume and secondary hyperparathyroidism  HPI: [name] is a 64 y.o. male with ESRD, DM  on TTS dialysis at [facility] admitted for right total knee arthroplasty due to end stage arthritis that failed conservative therapy. The plan is to d/c home with Home Health.  Having a fair amount of pain now.  Has not had any recent dialysis or access problems. No BP drops, cramping, N, V, D, CP or SOB.  Needs to have left knee also done at some point in the future.  Past Medical History  Diagnosis Date  . Hyperthyroidism   . GASTROENTERITIS, VIRAL 10/14/2009  . ONYCHOMYCOSIS, TOENAILS 12/26/2007  . GOITER, MULTINODULAR 12/26/2007  . HYPERTHYROIDISM 02/02/2010  . DIABETES MELLITUS, TYPE II 02/01/2010  . DYSLIPIDEMIA 06/18/2007  . GOUT 06/18/2007  . Hypocalcemia 06/07/2010  . DEPRESSION 10/14/2009  . PERIPHERAL NEUROPATHY 06/18/2007  . HYPERTENSION 06/18/2007  . CONGESTIVE HEART FAILURE 06/18/2007  . DIASTOLIC HEART FAILURE, CHRONIC 02/06/2009  . Unspecified hypotension 01/30/2010  . PULMONARY NODULE, RIGHT LOWER LOBE 06/08/2009  . GERD 06/18/2007  . BENIGN PROSTATIC HYPERTROPHY 10/14/2009  . GYNECOMASTIA 07/17/2010  . NECK PAIN 07/31/2010  . FOOT PAIN 08/12/2008  . DIZZINESS 07/17/2010  . Other malaise and fatigue 11/24/2009  . GAIT DISTURBANCE 03/03/2010  . DYSPNEA 10/29/2008  . CHEST PAIN 03/29/2010  . TRANSAMINASES, SERUM, ELEVATED 02/01/2010  . COLONIC POLYPS, HX OF 10/14/2009  . Anemia 06/16/2011  . Ischemic cardiomyopathy 06/16/2011  . Hyperparathyroidism, secondary ([place]) 06/16/2011  . OSA on CPAP 10/16/2011  . Hyperlipidemia 10/16/2011  . CAD, NATIVE VESSEL 02/06/2009    saw Dr. Missy Sabins last jan 2013  . Sleep apnea     cpap machine and o2  . Allergic rhinitis, cause unspecified 02/24/2014  . Transfusion history     none recent  . Hemodialysis access, fistula mature  [facility])     Dialysis [facility]Th-Sa ([place]) Right upper arm fistula  . ESRD (end stage renal disease) on dialysis ([place]) 08/04/2010    "TTS;  " (04/18/2015)  . Dementia [id number]   Past Surgical History  Procedure Laterality Date  . Coronary angioplasty with stent placement  06/11/2008  . Cervical spine surgery  2/09    "to repair nerve problems in my left arm"  . Back surgery  1998  . Esophagogastroduodenoscopy  09/28/2011    Procedure: ESOPHAGOGASTRODUODENOSCOPY (EGD);  Surgeon: Missy Sabins, MD;  Location: [facility] [place] ENDOSCOPY;  Service: Endoscopy;  Laterality: N/A;  . Givens capsule study  09/30/2011    Procedure: GIVENS CAPSULE STUDY;  Surgeon: Jeryl Columbia, MD;  Location: [facility] ENDOSCOPY;  Service: Endoscopy;  Laterality: N/A;  . Cholecystectomy    . Coronary angioplasty with stent placement  06/2007    TAXUS stent to RCA/notes 01/31/2011  . Tonsillectomy    . Av fistula placement  11/07/2011    Procedure: INSERTION OF ARTERIOVENOUS (AV) GORE-TEX GRAFT ARM;  Surgeon: Tinnie Gens, MD;  Location: [place];  Service: Vascular;  Laterality: Left;  . Arteriovenous graft placement Right 2009    forearm/notes 02/01/2011  . Insertion of dialysis catheter Right 2014  . Insertion of dialysis catheter Left 02/11/2013    Procedure: INSERTION OF DIALYSIS CATHETER;  Surgeon: Conrad [place], MD;  Location: [place];  Service: Vascular;  Laterality: Left;  Ultrasound guided  . Removal of a dialysis catheter Right 02/11/2013    Procedure: REMOVAL OF A  DIALYSIS CATHETER;  Surgeon: Conrad [place], MD;  Location: [place];  Service: Vascular;  Laterality: Right;  . Bascilic vein transposition Right 02/27/2013    Procedure: BASCILIC VEIN TRANSPOSITION;  Surgeon: Mal Misty, MD;  Location: [place];  Service: Vascular;  Laterality: Right;  Right Basilic Vein Transposition   . Shuntogram N/A 09/20/2011    Procedure: Earney Mallet;  Surgeon: Conrad Chauncey, MD;  Location: [facility] CATH LAB;  Service: Cardiovascular;  Laterality: N/A;   . Venogram N/A 01/26/2013    Procedure: VENOGRAM;  Surgeon: Angelia Mould, MD;  Location: [facility] CATH LAB;  Service: Cardiovascular;  Laterality: N/A;  . Esophagogastroduodenoscopy N/A 04/07/2015    Procedure: ESOPHAGOGASTRODUODENOSCOPY (EGD);  Surgeon: Teena Irani, MD;  Location: [facility] ENDOSCOPY;  Service: Endoscopy;  Laterality: N/A;  . Savory dilation N/A 04/07/2015    Procedure: SAVORY DILATION;  Surgeon: Teena Irani, MD;  Location: [facility] ENDOSCOPY;  Service: Endoscopy;  Laterality: N/A;  . Foreign body removal  09/2003    via upper endoscopy/notes 02/12/2011  . Esophagogastroduodenoscopy N/A 04/19/2015    Procedure: ESOPHAGOGASTRODUODENOSCOPY (EGD);  Surgeon: Arta Silence, MD;  Location: [facility] ENDOSCOPY;  Service: Endoscopy;  Laterality: N/A;   Family History  Problem Relation Age of Onset  . Heart disease Father   . Heart disease Sister    Social History:  reports that he has quit smoking. His smoking use included Cigarettes. He has a 25 pack-year smoking history. He quit smokeless tobacco use about 9 years ago. He reports that he drinks alcohol. He reports that he does not use illicit drugs. Allergies  Allergen Reactions  . Cephalexin Swelling and Other (See Comments)    Tongue swelling  . Statins     Weak muscles  . Ciprofloxacin Rash   Prior to Admission medications   Medication Sig Start Date End Date Taking? Authorizing Provider  allopurinol (ZYLOPRIM) 100 MG tablet Take 1 tablet (100 mg total) by mouth daily. 06/08/15  Yes Biagio Borg, MD  amLODipine (NORVASC) 10 MG tablet Take 10 mg by mouth daily. 04/13/15  Yes Historical Provider, MD  aspirin EC 81 MG tablet Take 81 mg by mouth daily.   Yes Historical Provider, MD  calcium-vitamin D (OSCAL WITH D) 500-200 MG-UNIT per tablet Take 3 tablets by mouth 3 (three) times daily with meals.    Yes Historical Provider, MD  carvedilol (COREG) 25 MG tablet Take 0.5 tablets (12.5 mg total) by mouth 2 (two) times daily with a meal. Only on non-HD  days 03/10/14  Yes Jolaine Artist, MD  cinacalcet (SENSIPAR) 60 MG tablet Take 60 mg by mouth 2 (two) times daily.    Yes Historical Provider, MD  colesevelam [facility]) 625 MG tablet Take 3 tablets (1,875 mg total) by mouth 2 (two) times daily with a meal. 06/08/15  Yes Biagio Borg, MD  DEXILANT 60 MG capsule Take 1 tablet by mouth daily as needed (acid reflux).  04/22/15  Yes Historical Provider, MD  ezetimibe (ZETIA) 10 MG tablet Take 1 tablet (10 mg total) by mouth daily. 12/29/14  Yes Jolaine Artist, MD  gabapentin (NEURONTIN) 300 MG capsule Take 1 capsule (300 mg total) by mouth at bedtime. 06/08/15  Yes Biagio Borg, MD  lanthanum (FOSRENOL) 1000 MG chewable tablet Chew 1,000 mg by mouth 2 times daily at 12 noon and 4 pm.   Yes Historical Provider, MD  multivitamin (RENA-VIT) TABS tablet Take 1 tablet by mouth daily.     Yes Historical Provider, MD  nateglinide (STARLIX) 60 MG tablet Take 1 tablet (60 mg total) by mouth 3 (three) times daily with meals. 03/14/15  Yes Renato Shin, MD  pregabalin (LYRICA) 100 MG capsule Take 100 mg by mouth daily as needed (nerve pain).   Yes Historical Provider, MD  docusate sodium (COLACE) 100 MG capsule Take 1 capsule (100 mg total) by mouth 2 (two) times daily. 08/02/15   Brittney Kelly, PA-C  enoxaparin (LOVENOX) 100 MG/ML injection Inject 0.3 mLs (30 mg total) into the skin daily. 08/02/15   Brittney Claiborne Billings, PA-C  ondansetron (ZOFRAN) 4 MG tablet Take 1 tablet (4 mg total) by mouth every 8 (eight) hours as needed for nausea or vomiting. 08/02/15   Brittney Claiborne Billings, PA-C  oxyCODONE-acetaminophen (PERCOCET) 5-325 MG tablet Take 1-2 tablets by mouth every 4 (four) hours as needed for severe pain. 08/02/15   Lovett Calender, PA-C   Current Facility-Administered Medications  Medication Dose Route Frequency Provider Last Rate Last Dose  . acetaminophen (TYLENOL) tablet 650 mg  650 mg Oral Q6H PRN Lovett Calender, PA-C       Or  . acetaminophen (TYLENOL) suppository  650 mg  650 mg Rectal Q6H PRN Lovett Calender, PA-C      . allopurinol (ZYLOPRIM) tablet 100 mg  100 mg Oral Daily Brittney Kelly, PA-C   100 mg at 08/03/15 0917  . amLODipine (NORVASC) tablet 10 mg  10 mg Oral Daily Lovett Calender, PA-C   10 mg at 08/03/15 0916  . calcium-vitamin D (OSCAL WITH D) 500-200 MG-UNIT per tablet 3 tablet  3 tablet Oral TID WC Lovett Calender, PA-C   3 tablet at 08/03/15 1145  . carvedilol (COREG) tablet 12.5 mg  12.5 mg Oral BID WC Brittney Kelly, PA-C   12.5 mg at 08/03/15 0916  . celecoxib (CELEBREX) capsule 200 mg  200 mg Oral Q12H Brittney Kelly, PA-C   200 mg at 08/03/15 0920  . cinacalcet (SENSIPAR) tablet 60 mg  60 mg Oral BID WC Brittney Kelly, PA-C   60 mg at 08/03/15 0917  . colesevelam [facility]) tablet 1,875 mg  1,875 mg Oral BID WC Brittney Kelly, PA-C   1,875 mg at 08/03/15 0916  . docusate sodium (COLACE) capsule 100 mg  100 mg Oral BID Lovett Calender, PA-C   100 mg at 08/03/15 0917  . enoxaparin (LOVENOX) injection 30 mg  30 mg Subcutaneous Q24H Brittney Kelly, PA-C   30 mg at 08/03/15 0919  . ezetimibe (ZETIA) tablet 10 mg  10 mg Oral Daily Lovett Calender, PA-C   10 mg at 08/03/15 [id number]  . fentaNYL (SUBLIMAZE) injection 100 mcg  100 mcg Intravenous Once Oleta Mouse, MD      . gabapentin (NEURONTIN) capsule 300 mg  300 mg Oral QHS Lovett Calender, PA-C   300 mg at 08/02/15 2133  . HYDROmorphone (DILAUDID) injection 1 mg  1 mg Intravenous Q2H PRN Lovett Calender, PA-C   1 mg at 08/03/15 0536  . lanthanum (FOSRENOL) chewable tablet 1,000 mg  1,000 mg Oral BID WC Lovett Calender, PA-C   1,000 mg at 08/03/15 0918  . menthol-cetylpyridinium (CEPACOL) lozenge 3 mg  1 lozenge Oral PRN Lovett Calender, PA-C       Or  . phenol (CHLORASEPTIC) mouth spray 1 spray  1 spray Mouth/Throat PRN Brittney Kelly, PA-C      . methocarbamol (ROBAXIN) tablet 500 mg  500 mg Oral Q6H PRN Lovett Calender, PA-C   500 mg at 08/03/15 0358   Or  . methocarbamol (ROBAXIN)  500 mg in  dextrose 5 % 50 mL IVPB  500 mg Intravenous Q6H PRN Lovett Calender, PA-C   500 mg at 08/02/15 1331  . metoCLOPramide (REGLAN) tablet 5-10 mg  5-10 mg Oral Q8H PRN Lovett Calender, PA-C       Or  . metoCLOPramide (REGLAN) injection 5-10 mg  5-10 mg Intravenous Q8H PRN Lovett Calender, PA-C      . multivitamin (RENA-VIT) tablet 1 tablet  1 tablet Oral Daily Lovett Calender, PA-C   1 tablet at 08/03/15 0919  . nateglinide (STARLIX) tablet 60 mg  60 mg Oral TID WC Brittney Kelly, PA-C   60 mg at 08/03/15 1145  . ondansetron (ZOFRAN) tablet 4 mg  4 mg Oral Q6H PRN Lovett Calender, PA-C       Or  . ondansetron (ZOFRAN) injection 4 mg  4 mg Intravenous Q6H PRN Lovett Calender, PA-C      . oxyCODONE (Oxy IR/ROXICODONE) immediate release tablet 5-10 mg  5-10 mg Oral Q3H PRN Lovett Calender, PA-C   10 mg at 08/03/15 0916  . pantoprazole (PROTONIX) EC tablet 40 mg  40 mg Oral Daily Brittney Kelly, PA-C   40 mg at 08/03/15 0919  . polyethylene glycol (MIRALAX / GLYCOLAX) packet 17 g  17 g Oral Daily PRN Lovett Calender, PA-C      . pregabalin (LYRICA) capsule 100 mg  100 mg Oral Daily PRN Lovett Calender, PA-C   100 mg at 08/03/15 0916   Labs: Basic Metabolic Panel:  Recent Labs Lab 08/02/15 0833 08/02/15 1909  NA 139  --   K 3.9  --   GLUCOSE 117*  --   CREATININE  --  7.70*   CBC:  Recent Labs Lab 08/02/15 0833 08/02/15 1909  WBC  --  8.7  HGB 12.9* 10.6*  HCT 38.0* 35.2*  MCV  --  92.9  PLT  --  226   CBG:  Recent Labs Lab 08/02/15 0811 08/02/15 1307  GLUCAP 114* 94   Studies/Results: Dg Knee Right Port  08/02/2015  CLINICAL DATA:  Right total knee arthroplasty. EXAM: PORTABLE RIGHT KNEE - 1-2 VIEW COMPARISON:  01/04/2015 FINDINGS: Right total knee arthroplasty identified. There is no evidence of subluxation or dislocation. Soft tissue postoperative changes are present. No complicating features are noted. IMPRESSION: Right total knee arthroplasty and postoperative changes. No definite  complicating features. Electronically Signed   By: Margarette Canada M.D.   On: 08/02/2015 14:35   ROS: As per HPI otherwise negative.  Physical Exam: Filed Vitals:   08/02/15 1906 08/02/15 2150 08/03/15 0457 08/03/15 1258  BP: 105/77 120/59 106/57 118/70  Pulse: 67 72 80 86  Temp: 97.7 F (36.5 C) 97.9 F (36.6 C) 98.2 F (36.8 C)   TempSrc: Oral Oral Oral   Resp: 16 16 18 18   Height:      Weight:      SpO2: 100% 100% 97% 95%     General: Well developed, well nourished AAM, in no acute distress but uncomfortable with any leg movement Head: Normocephalic, atraumatic, sclera non-icteric, mucus membranes are moist Neck: Supple. JVD not elevated. Lungs: Clear bilaterally to auscultation without wheezes, rales, or rhonchi. Breathing is unlabored. Heart: RRR  Abdomen: Obese, soft, non-tender, protruberant Lower extremities: LLE without edema or ischemic changes, RLE immobilized Neuro: Alert and oriented X 3. Moves all extremities spontaneously. Psych:  Responds to questions appropriately with a normal affect. Dialysis Access:right upper AVF + bruit  Dialysis Orders: Center: [facility] TTS 4 hr  180 107.5 2 K 2.25 Ca right upper AVF 450/800 profile 4 hectorol 5 mircera 50 q 2 weeks -last given 10/27 no heparin no IV Fe Most Recent Phos / PTH: 6.2/ 574 Most Recent Hgb/TSAT / Ferritin: 11.7/33%/1573 Most Recent eKT/V: 1.59 Treatment Adherence: excellent  Assessment/Plan: 1.  S/P right total knee arthroplasty- per ortho  2.  ESRD -TTS   Already on no heparin HD, continue usual orders- Had HD Monday in anticipation of surgery yesterday - next HD Thursday 3.  Hypertension/volume  - BP a little lower than usual, may need EDW lowered slilghtly - last outpt post HD weight was 107l on coreg and amlodipine 4.  Anemia  - continue ESA per schedule - due for redose next week Hgb down to 10.6 post op - follow 5.  Metabolic bone disease -  Continue hectorol, fosrenol takes 2 ac- would like to change, sensipar  and 1-2 tums ac- advised he needed to continue fosrenol for now - can consider change after d/c but I think that this is a good option for him; change Ca +D to tums 6.  Nutrition - change to renal carb mod/multivit  Myriam Jacobson, PA-C [place] [phone] 08/03/2015, 1:45 PM   Pt seen, examined and agree w A/P as above.  Kelly Splinter MD [employer] pager [phone]    cell [phone] 08/03/2015, 5:10 PM

## 2015-08-03 NOTE — Evaluation (Signed)
Physical Therapy Evaluation Patient Details Name: [name] MRN: [id number] DOB: November 04, 1950 Today's Date: 08/03/2015   History of Present Illness  Pt presents for right TKA, PMH of OA, CAD, ESRD on HD, gait disturbance, GI bleed, dementia, dizziness   Clinical Impression  Pt is s/p TKA resulting in the deficits listed below (see PT Problem List). At this point, pt mod A for transfers and will need +2 assist for safe ambulation as he is lethargic. Because of this, currently recommending SNF but hopeful that pt will progress as he is more alert and be able to go home with HHPT.  Pt will benefit from skilled PT to increase their independence and safety with mobility to allow discharge to the venue listed below.      Follow Up Recommendations SNF;Supervision/Assistance - 24 hour    Equipment Recommendations  None recommended by PT    Recommendations for Other Services OT consult     Precautions / Restrictions Precautions Precautions: Fall;Knee Precaution Comments: discussed proper positioning. Pt fell 2x week before surgery Restrictions Weight Bearing Restrictions: Yes RLE Weight Bearing: Weight bearing as tolerated      Mobility  Bed Mobility Overal bed mobility: Needs Assistance Bed Mobility: Supine to Sit     Supine to sit: Min assist     General bed mobility comments: pt able to use UE's to assist RLE to get to EOB, min A to achieve full upright and pt able to scoot to EOB without assist  Transfers Overall transfer level: Needs assistance Equipment used: Rolling walker (2 wheeled) Transfers: Sit to/from [facility] Sit to Stand: Mod assist Stand pivot transfers: Mod assist       General transfer comment: mod A for power up and pt needing max encouragement as he was very fearful. In standing, pt slightly dizzy as well as having difficulty extending bilateral knees and elbows so maintain stooped posture. WIll try KI in afternoon for increased RLE  stability. Limited ambulation to chair for safety.  Ambulation/Gait             General Gait Details: dizzy, unable to ambulate safely at this point  Stairs            Wheelchair Mobility    Modified Rankin (Stroke Patients Only)       Balance Overall balance assessment: Needs assistance;History of Falls Sitting-balance support: Feet supported;No upper extremity supported Sitting balance-Leahy Scale: Good     Standing balance support: Bilateral upper extremity supported Standing balance-Leahy Scale: Poor                               Pertinent Vitals/Pain Pain Assessment: Faces Faces Pain Scale: Hurts whole lot Pain Location: right knee as well as feet from neuropathy Pain Descriptors / Indicators: Burning;Aching Pain Intervention(s): Limited activity within patient's tolerance;Monitored during session;Premedicated before session    Home Living Family/patient expects to be discharged to:: Private residence Living Arrangements: Spouse/significant other Available Help at Discharge: Family;Available 24 hours/day Type of Home: House Home Access: Stairs to enter Entrance Stairs-Rails: Right Entrance Stairs-Number of Steps: 3 Home Layout: One level Home Equipment: Walker - 2 wheels;Cane - single point Additional Comments: pt lives with wife, has other family support as well    Prior Function Level of Independence: Independent with assistive device(s)         Comments: uses RW, has h/o falls     Hand Dominance  Extremity/Trunk Assessment   Upper Extremity Assessment: Defer to OT evaluation           Lower Extremity Assessment: RLE deficits/detail;LLE deficits/detail RLE Deficits / Details: hip flex 1/5, knee ext 2/5, ankle df 3/5 LLE Deficits / Details: generalized weakness LLE, knee grossly 4-/5, pt reports he needs TKA on this side as well  Cervical / Trunk Assessment: Kyphotic  Communication   Communication: No  difficulties  Cognition Arousal/Alertness: Lethargic;Suspect due to medications Behavior During Therapy: Flat affect;Anxious Overall Cognitive Status: Impaired/Different from baseline Area of Impairment: Following commands;Problem solving       Following Commands: Follows one step commands consistently;Follows multi-step commands inconsistently;Follows one step commands with increased time     Problem Solving: Slow processing;Decreased initiation;Requires verbal cues;Requires tactile cues General Comments: pt has mild dementia at baseline as well as being on HD, so very lethargic today, falling asleep mid sentence. Spoke with MD about decreasing amt of pain meds for increased arousal    General Comments General comments (skin integrity, edema, etc.): pt afraid of falling    Exercises Total Joint Exercises Ankle Circles/Pumps: AROM;Both;10 reps;Seated Quad Sets: AROM;Both;10 reps;Seated Goniometric ROM: 0-85      Assessment/Plan    PT Assessment Patient needs continued PT services  PT Diagnosis Difficulty walking;Abnormality of gait;Generalized weakness;Acute pain   PT Problem List Decreased strength;Decreased range of motion;Decreased activity tolerance;Decreased balance;Decreased mobility;Decreased coordination;Decreased cognition;Decreased knowledge of use of DME;Decreased knowledge of precautions;Decreased safety awareness;Pain;Impaired sensation  PT Treatment Interventions DME instruction;Gait training;Stair training;Functional mobility training;Therapeutic activities;Therapeutic exercise;Balance training;Neuromuscular re-education;Cognitive remediation;Patient/family education   PT Goals (Current goals can be found in the Care Plan section) Acute Rehab PT Goals Patient Stated Goal: return home PT Goal Formulation: With patient Time For Goal Achievement: 08/10/15 Potential to Achieve Goals: Fair    Frequency BID   Barriers to discharge   do not feel at this point that  family will be able to assist him safely at home    Co-evaluation               End of Session Equipment Utilized During Treatment: Gait belt Activity Tolerance: Patient limited by lethargy;Patient limited by pain Patient left: in chair;with call bell/phone within reach;with chair alarm set Nurse Communication: Mobility status         Time: 0802-0838 PT Time Calculation (min) (ACUTE ONLY): 36 min   Charges:   PT Evaluation $Initial PT Evaluation Tier I: 1 Procedure PT Treatments $Therapeutic Activity: 8-22 mins   PT G Codes:      Leighton Roach, PT  Acute Rehab Services  [place], [place] 08/03/2015, 10:04 AM

## 2015-08-03 NOTE — Progress Notes (Signed)
Physical Therapy Treatment Patient Details Name: [name] MRN: [id number] DOB: [birth year] Today's Date: 08/03/2015    History of Present Illness Pt presents for right TKA, PMH of OA, CAD, ESRD on HD, gait disturbance, GI bleed, dementia, dizziness     PT Comments    Pt more alert second session but continues to need +2 assist for ambulation due to weakness bilateral UE's and LLE. Pt positioned in right knee extension with leg supported in neutral hip position. PT will continue to follow.   Follow Up Recommendations  SNF;Supervision/Assistance - 24 hour     Equipment Recommendations  None recommended by PT    Recommendations for Other Services       Precautions / Restrictions Precautions Precautions: Fall;Knee Precaution Booklet Issued: No Precaution Comments: pt tends to externally rotate right hip, reviewed proper positioning making sure that knee was truly in extension with toes pointing upward Restrictions Weight Bearing Restrictions: Yes RLE Weight Bearing: Weight bearing as tolerated    Mobility  Bed Mobility Overal bed mobility: Needs Assistance             General bed mobility comments: Pt seated in recliner chair upon entering the room.  Transfers Overall transfer level: Needs assistance Equipment used: Rolling walker (2 wheeled) Transfers: Sit to/from Stand Sit to Stand: Min assist         General transfer comment: Min A to stand from recliner chair this session. Movement taking increased time as pt very fearful of falling and anxious with movement. OT providing reassurance and encouragment. Pt having difficulty coming to full stand and B knees observed to be buckling as well.   Ambulation/Gait Ambulation/Gait assistance: Mod assist;+2 physical assistance Ambulation Distance (Feet): 8 Feet Assistive device: Rolling walker (2 wheeled) Gait Pattern/deviations: Step-to pattern;Decreased stance time - right;Decreased step length -  right;Antalgic Gait velocity: very slow Gait velocity interpretation: <1.8 ft/sec, indicative of risk for recurrent falls General Gait Details: pt not dizzy during this session. Small steps, antalgic gait with decreased push through UE's. Pt states that his UE's are string but wife reports that LUE is weak due to neck pathology and pt has fistual RUE. Difficulty with full elbow extension as well as knee extension bilaterally in standing   Stairs            Wheelchair Mobility    Modified Rankin (Stroke Patients Only)       Balance Overall balance assessment: History of Falls;Needs assistance Sitting-balance support: Feet supported;No upper extremity supported Sitting balance-Leahy Scale: Good     Standing balance support: Bilateral upper extremity supported Standing balance-Leahy Scale: Poor Standing balance comment: reliance on RW                    Cognition Arousal/Alertness: Awake/alert Behavior During Therapy: Flat affect Overall Cognitive Status: Impaired/Different from baseline Area of Impairment: Following commands;Problem solving       Following Commands: Follows one step commands consistently;Follows multi-step commands inconsistently;Follows one step commands with increased time     Problem Solving: Slow processing;Decreased initiation;Requires verbal cues;Requires tactile cues General Comments: pt more alert second session and less interally distracted, still silghtly lethargic and wife reports he is not his normal self yet. Slow processing    Exercises Total Joint Exercises Ankle Circles/Pumps: AROM;Both;10 reps;Seated Long Arc Quad: AROM;Right;10 reps;Seated    General Comments        Pertinent Vitals/Pain Pain Assessment: 0-10 Pain Score: 7  Pain Location: right knee Pain Descriptors / Indicators: Burning;Aching  Pain Intervention(s): Limited activity within patient's tolerance;Premedicated before session;Repositioned    Home Living  Family/patient expects to be discharged to:: Private residence Living Arrangements: Spouse/significant other Available Help at Discharge: Family;Available 24 hours/day Type of Home: House Home Access: Stairs to enter Entrance Stairs-Rails: Right Home Layout: One level Home Equipment: Walker - 2 wheels;Cane - single point Additional Comments: pt lives with wife, has other family support as well    Prior Function Level of Independence: Independent with assistive device(s)      Comments: uses RW, has h/o falls   PT Goals (current goals can now be found in the care plan section) Acute Rehab PT Goals Patient Stated Goal: return home PT Goal Formulation: With patient Time For Goal Achievement: 08/10/15 Potential to Achieve Goals: Fair Progress towards PT goals: Progressing toward goals    Frequency  BID    PT Plan Current plan remains appropriate    Co-evaluation             End of Session Equipment Utilized During Treatment: Gait belt;Right knee immobilizer Activity Tolerance: Patient limited by lethargy;Patient limited by pain Patient left: in chair;with call bell/phone within reach;with family/visitor present     Time: [id number] PT Time Calculation (min) (ACUTE ONLY): 22 min  Charges:  $Gait Training: 8-22 mins                    G Codes:     Leighton Roach, PT  Acute Rehab Services  [place], [place] 08/03/2015, 2:00 PM

## 2015-08-03 NOTE — Progress Notes (Signed)
Patient placed in zero knee after CPM removed at 2038. Patient had been medicated with 10 mg oxycodone. Pt called RN shortly after and received 1 mg of dilaudid, ice was also applied to patient's knee. One hour later pt still complaining of pain in the back of knee. Zero knee removed. Pts tone of voice is loud and direct, patient states that "we dont know how to control his pain". Patient did state that since the zero knee was removed his pain level has decreased. RN informed patient of next time pain medication would be due and the concerns of over medicating patient because of his renal insufficiency. Patient states that he understood but would also need all the pain medication that the MD ordered. RN stated that she understood patients request. Continued education provided throughout shift regarding pain management and pain medication in conjunction with patient's medical history.  0445- Patient has received 10 oxy q3h throughout the night and has also received 1 mg dilaudid when needed (on 2 seperate occasions, see MAR). Patient resting comfortably and has shown no sign of distress throughout the shift. Patient has also yet to return to the zero knee. Patient placed in CPM 0-60 at 0500 and has gone back to sleep. Nursing will continue to monitor.

## 2015-08-03 NOTE — Progress Notes (Signed)
     Subjective:  POD#1 R TKA. Patient reports pain as moderate.  Patient was very demanding of pain medication throughout the evening, but seems to be comfortable at this time.  Resting in bed this morning.  Will see how the patient mobilizes today, but suspect that he will need at least on more night for pain control and mobilization.    Objective:   VITALS:   Filed Vitals:   08/02/15 1655 08/02/15 1906 08/02/15 2150 08/03/15 0457  BP:  105/77 120/59 106/57  Pulse: 66 67 72 80  Temp: 97 F (36.1 C) 97.7 F (36.5 C) 97.9 F (36.6 C) 98.2 F (36.8 C)  TempSrc:  Oral Oral Oral  Resp: 14 16 16 18   Height:      Weight:      SpO2:  100% 100% 97%    Neurologically intact ABD soft Neurovascular intact Sensation intact distally Intact pulses distally Dorsiflexion/Plantar flexion intact Incision: dressing C/D/I   Lab Results  Component Value Date   WBC 8.7 08/02/2015   HGB 10.6* 08/02/2015   HCT 35.2* 08/02/2015   MCV 92.9 08/02/2015   PLT 226 08/02/2015   BMET    Component Value Date/Time   NA 139 08/02/2015 0833   K 3.9 08/02/2015 0833   CL 101 07/20/2015 1254   CO2 26 07/20/2015 1254   GLUCOSE 117* 08/02/2015 0833   BUN 31* 07/20/2015 1254   CREATININE 7.70* 08/02/2015 1909   CALCIUM 9.3 07/20/2015 1254   GFRNONAA 6* 08/02/2015 1909   GFRAA 8* 08/02/2015 1909     Assessment/Plan: 1 Day Post-Op   Active Problems:   Arthritis of knee   Up with therapy WBAT in the RLE Lovenox for DVT prophylaxis   [name] 08/03/2015, 6:58 AM Cell [phone]

## 2015-08-03 NOTE — Anesthesia Postprocedure Evaluation (Signed)
  Anesthesia Post-op Note  Patient: [name]  Procedure(s) Performed: Procedure(s): TOTAL KNEE ARTHROPLASTY (Right)  Patient Location: PACU  Anesthesia Type:Regional and Spinal  Level of Consciousness: awake  Airway and Oxygen Therapy: Patient Spontanous Breathing  Post-op Pain: moderate  Post-op Assessment: Post-op Vital signs reviewed, Patient's Cardiovascular Status Stable, Respiratory Function Stable, Patent Airway, No signs of Nausea or vomiting and Pain level controlled LLE Motor Response: Purposeful movement (wiggles toes, slight knee bend off bed) LLE Sensation: Full sensation, No numbness, No tingling RLE Motor Response: Purposeful movement (wiggles toes) RLE Sensation: Full sensation, No numbness, No tingling, Pain L Sensory Level: S1-Sole of foot, small toes R Sensory Level: S1-Sole of foot, small toes  Post-op Vital Signs: Reviewed and stable  Last Vitals:  Filed Vitals:   08/03/15 0457  BP: 106/57  Pulse: 80  Temp: 36.8 C  Resp: 18    Complications: No apparent anesthesia complications

## 2015-08-04 ENCOUNTER — Encounter (HOSPITAL_COMMUNITY): Payer: Self-pay | Admitting: General Practice

## 2015-08-04 LAB — RENAL FUNCTION PANEL
Albumin: 2.9 g/dL — ABNORMAL LOW (ref 3.5–5.0)
Anion gap: 15 (ref 5–15)
BUN: 63 mg/dL — ABNORMAL HIGH (ref 6–20)
CO2: 26 mmol/L (ref 22–32)
Calcium: 9.5 mg/dL (ref 8.9–10.3)
Chloride: 98 mmol/L — ABNORMAL LOW (ref 101–111)
Creatinine, Ser: 11.71 mg/dL — ABNORMAL HIGH (ref 0.61–1.24)
GFR calc Af Amer: 5 mL/min — ABNORMAL LOW (ref >60)
GFR calc non Af Amer: 4 mL/min — ABNORMAL LOW (ref >60)
Glucose, Bld: 97 mg/dL (ref 65–99)
Phosphorus: 7 mg/dL — ABNORMAL HIGH (ref 2.5–4.6)
Potassium: 5.1 mmol/L (ref 3.5–5.1)
Sodium: 139 mmol/L (ref 135–145)

## 2015-08-04 LAB — CBC
HCT: 28.2 % — ABNORMAL LOW (ref 39.0–52.0)
Hemoglobin: 8.9 g/dL — ABNORMAL LOW (ref 13.0–17.0)
MCH: 28.3 pg (ref 26.0–34.0)
MCHC: 31.6 g/dL (ref 30.0–36.0)
MCV: 89.8 fL (ref 78.0–100.0)
Platelets: 225 10*3/uL (ref 150–400)
RBC: 3.14 MIL/uL — ABNORMAL LOW (ref 4.22–5.81)
RDW: 18.5 % — ABNORMAL HIGH (ref 11.5–15.5)
WBC: 10.6 10*3/uL — ABNORMAL HIGH (ref 4.0–10.5)

## 2015-08-04 LAB — GLUCOSE, CAPILLARY: Glucose-Capillary: 95 mg/dL (ref 65–99)

## 2015-08-04 MED ORDER — SODIUM CHLORIDE 0.9 % IV SOLN: 100.0000 mL | INTRAVENOUS | Status: DC | PRN

## 2015-08-04 MED ORDER — ALTEPLASE 2 MG IJ SOLR
2.0000 mg | Freq: Once | INTRAMUSCULAR | Status: DC | PRN
  Filled 2015-08-04: qty 2

## 2015-08-04 MED ORDER — LIDOCAINE HCL (PF) 1 % IJ SOLN: 5.0000 mL | INTRAMUSCULAR | Status: DC | PRN

## 2015-08-04 MED ORDER — PENTAFLUOROPROP-TETRAFLUOROETH EX AERO: 1.0000 "application " | INHALATION_SPRAY | CUTANEOUS | Status: DC | PRN

## 2015-08-04 MED ORDER — HEPARIN SODIUM (PORCINE) 1000 UNIT/ML DIALYSIS
1000.0000 [IU] | INTRAMUSCULAR | Status: DC | PRN
  Filled 2015-08-04: qty 1

## 2015-08-04 MED ORDER — DOXERCALCIFEROL 4 MCG/2ML IV SOLN
INTRAVENOUS | Status: AC
  Filled 2015-08-04: qty 4

## 2015-08-04 MED ORDER — LIDOCAINE-PRILOCAINE 2.5-2.5 % EX CREA
1.0000 "application " | TOPICAL_CREAM | CUTANEOUS | Status: DC | PRN
  Filled 2015-08-04: qty 5

## 2015-08-04 NOTE — Progress Notes (Signed)
Physical Therapy Treatment Patient Details Name: [name] MRN: [id number] DOB: [birth year] Today's Date: 08/04/2015    History of Present Illness Pt presents for right TKA, PMH of OA, CAD, ESRD on HD, gait disturbance, GI bleed, dementia, dizziness     PT Comments    Started treatment with patient in room, in recliner with immobilizer on; instructed in proper use/when to wear and donning/doffing; sit ti stand from recliner to RW with max assist x2 and VC to use LE's; once standing pt. Demonstrated a very forward flexed posture and weakness through BUE taking increased time to take 4 small, step-to, steps with max assist x2 and chair behind; pt. Required 90% VC and TC for upright posture and to bear weight through LE; pt. Performed knee exercises however was limited due to pain and altered state of cognition (per pt. Wife statement)   Follow Up Recommendations  SNF;Supervision/Assistance - 24 hour     Equipment Recommendations  None recommended by PT    Recommendations for Other Services OT consult     Precautions / Restrictions Precautions Precautions: Knee;Fall Precaution Booklet Issued: No Precaution Comments: pt tends to externally rotate right hip, reviewed proper positioning making sure that knee was truly in extension with toes pointing upward (pillow on L lateral thigh to keep from laterally rotating) Required Braces or Orthoses: Knee Immobilizer - Right Knee Immobilizer - Right: On when out of bed or walking Restrictions Weight Bearing Restrictions: Yes RLE Weight Bearing: Weight bearing as tolerated    Mobility  Bed Mobility               General bed mobility comments: pt. seated in recliner chair upon entering room and left in recliner after treatment   Transfers Overall transfer level: Needs assistance Equipment used: Rolling walker (2 wheeled) Transfers: Sit to/from Stand Sit to Stand: Max assist         General transfer comment: max assist for STS  out of recliner to RW with cues for use of LE and to stand upright; movement time increased with patients anxiousness  Ambulation/Gait Ambulation/Gait assistance: Max assist;+2 physical assistance Ambulation Distance (Feet): 3 Feet (pt. completed ~4 steps ) Assistive device: Rolling walker (2 wheeled) Gait Pattern/deviations: Step-to pattern;Decreased step length - right;Decreased step length - left;Trunk flexed;Antalgic Gait velocity: very slow    General Gait Details: pt not dizzy during this session; pain high. Small steps, antalgic gait with 90% VC and TC for upright posture and to take a step; completed ~4 very small steps    Stairs            Wheelchair Mobility    Modified Rankin (Stroke Patients Only)       Balance                                    Cognition Arousal/Alertness: Awake/alert (pt's wife stated pt. is a little groggy today ) Behavior During Therapy: WFL for tasks assessed/performed Overall Cognitive Status: Within Functional Limits for tasks assessed         Following Commands: Follows one step commands consistently;Follows multi-step commands with increased time     Problem Solving: Slow processing;Requires verbal cues;Decreased initiation General Comments: pt. able to follow simple commands; wife reports patient is still groggy     Exercises Total Joint Exercises Ankle Circles/Pumps: AROM;Both;10 reps;Seated Quad Sets: AROM;Right;5 reps;Seated Heel Slides: Right;5 reps;Seated;AAROM Hip ABduction/ADduction: AAROM;Right;5 reps;Seated  General Comments        Pertinent Vitals/Pain Pain Score: 8  Pain Location: right knee and place of tournequet  Pain Intervention(s): Limited activity within patient's tolerance;Monitored during session;Patient requesting pain meds-RN notified;Ice applied;Heat applied (ice to knee; heat to tourniquet area)    Home Living                      Prior Function            PT  Goals (current goals can now be found in the care plan section) Acute Rehab PT Goals Patient Stated Goal: get better PT Goal Formulation: With patient Time For Goal Achievement: 08/10/15 Potential to Achieve Goals: Fair Progress towards PT goals: Progressing toward goals    Frequency  BID    PT Plan Current plan remains appropriate    Co-evaluation             End of Session Equipment Utilized During Treatment: Gait belt Activity Tolerance: Patient limited by pain Patient left: in chair;with call bell/phone within reach;with family/visitor present     Time: 1450-1515 PT Time Calculation (min) (ACUTE ONLY): 25 min  Charges:  $Gait Training: 8-22 mins $Therapeutic Exercise: 8-22 mins                    G CodesDenna Haggard, SPTA   08/04/2015 3:29 PM   Pager: [phone]   Reviewed and agree with above Rica Koyanagi  PTA [facility]  Acute  Rehab Pager      [phone]

## 2015-08-04 NOTE — Progress Notes (Signed)
     Subjective:  POD#2 R TKA. Patient reports pain as mild to moderate.  Patient sleeping comfortably in bed this morning.  Transport has arrived to take him down to dialysis this morning.  We will see how he does with PT today after dialysis, but pain and mobilization seems to be improving.  Suspect will need one more night before discharge.   Objective:   VITALS:   Filed Vitals:   08/02/15 2150 08/03/15 0457 08/03/15 1258 08/03/15 2156  BP: 120/59 106/57 118/70 121/63  Pulse: 72 80 86 86  Temp: 97.9 F (36.6 C) 98.2 F (36.8 C)  98.7 F (37.1 C)  TempSrc: Oral Oral  Oral  Resp: 16 18 18 18   Height:      Weight:      SpO2: 100% 97% 95% 95%    Neurologically intact ABD soft Neurovascular intact Sensation intact distally Intact pulses distally Dorsiflexion/Plantar flexion intact Incision: dressing C/D/I   Lab Results  Component Value Date   WBC 8.7 08/02/2015   HGB 10.6* 08/02/2015   HCT 35.2* 08/02/2015   MCV 92.9 08/02/2015   PLT 226 08/02/2015   BMET    Component Value Date/Time   NA 139 08/02/2015 0833   K 3.9 08/02/2015 0833   CL 101 07/20/2015 1254   CO2 26 07/20/2015 1254   GLUCOSE 117* 08/02/2015 0833   BUN 31* 07/20/2015 1254   CREATININE 7.70* 08/02/2015 1909   CALCIUM 9.3 07/20/2015 1254   GFRNONAA 6* 08/02/2015 1909   GFRAA 8* 08/02/2015 1909     Assessment/Plan: 2 Days Post-Op   Active Problems:   Arthritis of knee   Up with therapy WBAT in the RLE Lovenox for DVT prophylaxis Q 4hr oxycodone to stay on top of pain.  IV pain medication d/c today.    Bernie Ransford Lelan Pons 08/04/2015, 6:36 AM Cell [phone]

## 2015-08-04 NOTE — Progress Notes (Signed)
[facility] Progress Note  Assessment/Plan: 1. S/P right total knee arthroplasty- per ortho - on lovenox 2. ESRD -TTS Already on no heparin HD, continue usual orders- Had HD Monday in anticipation of surgery yesterday - next HD Thursday K 5.1 3. Hypertension/volume - BP a little lower than usual, may need EDW lowered slilghtly - last outpt post HD weight was 107l on coreg and amlodipine 4. Anemia - continue ESA per schedule - due for redose next week Hgb down to 10.6 >8.9  follow 5. Metabolic bone disease - Continue hectorol, fosrenol takes 2 ac- would like to change, sensipar and 1-2 tums ac- advised he needed to continue fosrenol for now - can consider change after d/c but I think that this is a good option for him; change Ca +D to tums 6. Nutrition - change to renal carb mod/multivit 7. Clots from AVF today - review of outpt records show that he requires serial interventions most recently April 2016 and April 26 2015 at [facility].  Last intervention -had long inflow basilic segment stenosis 70 - 80% to < 30%, outflow stent patent with mild ingrowth along inflow 1/3, mild innominate stenosis but not enough to angioplasty; suspect time for another intervention; will discuss with Dr. Durenda Age, PA-C [place] [phone] 08/04/2015,8:20 AM  LOS: 2 days   Pt seen, examined and agree w A/P as above.  Kelly Splinter MD [facility] pager [phone]    cell [phone] 08/04/2015, 1:50 PM    Subjective:   Right knee pain a little better  Objective Filed Vitals:   08/04/15 0730 08/04/15 0745 08/04/15 0800 08/04/15 0806  BP: 130/35 135/47 131/65 109/66  Pulse: 77 78 80   Temp: 98.8 F (37.1 C)     TempSrc:      Resp: 20     Height:      Weight: 107.3 kg (236 lb 8.9 oz)     SpO2: 94%      Physical Exam using CAP while on HD to help him sleep General: NAD Heart: RRR Lungs: no rales Abdomen: obese  soft Extremities:right LE leg immobilizer LLE no edema Dialysis Access: right upper AVF - Qb 300 - RN pulling clots this am   Dialysis Orders: Center: [facility] TTS 4 hr 180 107.5 2 K 2.25 Ca right upper AVF 450/800 profile 4 hectorol 5 mircera 50 q 2 weeks -last given 10/27 no heparin no IV Fe Most Recent Phos / PTH: 6.2/ 574 Most Recent Hgb/TSAT / Ferritin: 11.7/33%/1573 Most Recent eKT/V: 1.59 Treatment Adherence: excellent  Additional Objective Labs: Basic Metabolic Panel:  Recent Labs Lab 08/02/15 0833 08/02/15 1909  NA 139  --   K 3.9  --   GLUCOSE 117*  --   CREATININE  --  7.70*  CBC:  Recent Labs Lab 08/02/15 0833 08/02/15 1909 08/04/15 0745  WBC  --  8.7 10.6*  HGB 12.9* 10.6* 8.9*  HCT 38.0* 35.2* 28.2*  MCV  --  92.9 89.8  PLT  --  226 225   CBG:  Recent Labs Lab 08/02/15 0811 08/02/15 1307 08/04/15 0646  GLUCAP 114* 94 95    Studies/Results: Dg Knee Right Port  08/02/2015  CLINICAL DATA:  Right total knee arthroplasty. EXAM: PORTABLE RIGHT KNEE - 1-2 VIEW COMPARISON:  01/04/2015 FINDINGS: Right total knee arthroplasty identified. There is no evidence of subluxation or dislocation. Soft tissue postoperative changes are present. No complicating features are noted. IMPRESSION: Right total knee  arthroplasty and postoperative changes. No definite complicating features. Electronically Signed   By: Margarette Canada M.D.   On: 08/02/2015 14:35   Medications:   . allopurinol  100 mg Oral Daily  . amLODipine  10 mg Oral Daily  . calcium carbonate  2 tablet Oral TID WC  . carvedilol  12.5 mg Oral BID WC  . celecoxib  200 mg Oral Q12H  . cinacalcet  60 mg Oral BID WC  . colesevelam  1,875 mg Oral BID WC  . docusate sodium  100 mg Oral BID  . doxercalciferol  5 mcg Intravenous Q T,Th,Sa-HD  . enoxaparin (LOVENOX) injection  30 mg Subcutaneous Q24H  . ezetimibe  10 mg Oral Daily  . fentaNYL (SUBLIMAZE) injection  100 mcg Intravenous Once  . gabapentin  300 mg Oral  QHS  . lanthanum  1,000 mg Oral BID WC  . multivitamin  1 tablet Oral Daily  . nateglinide  60 mg Oral TID WC  . oxyCODONE  5-10 mg Oral Q4H  . pantoprazole  40 mg Oral Daily

## 2015-08-04 NOTE — Progress Notes (Signed)
OT Cancellation Note  Pt currently at HD. Will check back as time allows.  Truman Hayward, M.S., OTR/L Pager: [phone] 08/04/15, 9:19 AM

## 2015-08-04 NOTE — NC FL2 (Signed)
[place] LEVEL OF CARE SCREENING TOOL     IDENTIFICATION  Patient Name: [name] Birthdate: January 24, 1951 Sex: male Admission Date (Current Location): 08/02/2015  [facility] and [place] Number: Herbalist and Address:  [facility], [place] [address], [place], [place] [address]      Provider Number: [id number]  Attending Physician Name and Address:  Renette Butters, MD  Relative Name and Phone Number:       Current Level of Care: Hospital Recommended Level of Care: [place] Prior Approval Number:    Date Approved/Denied:   PASRR Number: [id number]  Discharge Plan: SNF    Current Diagnoses: Patient Active Problem List   Diagnosis Date Noted  . Arthritis of knee 08/02/2015  . Headache 06/08/2015  . Melena 04/19/2015  . Acute blood loss anemia 04/18/2015  . Dialysis patient ([place]) 04/18/2015  . Hyperthyroidism 03/11/2015  . Thigh pain 12/08/2014  . PSA elevation 08/30/2014  . Allergic rhinitis, cause unspecified 02/24/2014  . Elevated PSA 02/24/2014  . Vertigo 10/14/2013  . Numbness 05/15/2013  . ESRD on dialysis ([place]) 04/06/2013  . Chronic systolic heart failure ([place]) 02/09/2013  . Other complications due to renal dialysis device, implant, and graft 01/14/2013  . Right hand pain 03/28/2012  . Dysphagia 03/28/2012  . Hypogonadism, male 03/28/2012  . OSA (obstructive sleep apnea) 10/16/2011  . Hyperlipidemia 10/16/2011  . CAD (coronary artery disease) 09/28/2011  . NSTEMI (non-ST elevated myocardial infarction) ([place]) 09/28/2011  . AVM (arteriovenous malformation) 09/27/2011  . Anemia associated with acute blood loss 09/26/2011  . Anemia 06/16/2011  . Ischemic cardiomyopathy 06/16/2011  . Hyperparathyroidism, secondary ([place]) 06/16/2011  . Preventative health care 03/11/2011  . NECK PAIN 07/31/2010  . GYNECOMASTIA 07/17/2010  . DIZZINESS 07/17/2010  . GAIT DISTURBANCE 03/03/2010  . Thyrotoxicosis  02/02/2010  . Diabetes ([place]) 02/01/2010  . TRANSAMINASES, SERUM, ELEVATED 02/01/2010  . Other malaise and fatigue 11/24/2009  . DEPRESSION 10/14/2009  . BENIGN PROSTATIC HYPERTROPHY 10/14/2009  . COLONIC POLYPS, HX OF 10/14/2009  . Dementia 09/02/2009  . PULMONARY NODULE, RIGHT LOWER LOBE 06/08/2009  . DIASTOLIC HEART FAILURE, CHRONIC 02/06/2009  . DYSPNEA 10/29/2008  . FOOT PAIN 08/12/2008  . ONYCHOMYCOSIS, TOENAILS 12/26/2007  . GOITER, MULTINODULAR 12/26/2007  . GOUT 06/18/2007  . PERIPHERAL NEUROPATHY 06/18/2007  . Essential hypertension 06/18/2007  . GERD 06/18/2007    Orientation ACTIVITIES/SOCIAL BLADDER RESPIRATION    Self, Time, Situation, Place    Continent O2 (As needed)  BEHAVIORAL SYMPTOMS/MOOD NEUROLOGICAL BOWEL NUTRITION STATUS      Continent  ([place] Modified)  PHYSICIAN VISITS COMMUNICATION OF NEEDS Height & Weight Skin    Verbally 6\' 1"  (185.4 cm) 235 lbs. Surgical wounds          AMBULATORY STATUS RESPIRATION    Supervision limited O2 (As needed)      Personal Care Assistance Level of Assistance  Bathing, Dressing Bathing Assistance: Limited assistance   Dressing Assistance: Limited assistance      Functional Limitations Info                SPECIAL CARE FACTORS FREQUENCY                      Additional Factors Info  Code Status Code Status Info: Full Code             Current Medications (08/04/2015): Current Facility-Administered Medications  Medication Dose Route Frequency Provider Last Rate Last Dose  .  acetaminophen (TYLENOL) tablet 650 mg  650 mg Oral Q6H PRN Lovett Calender, PA-C       Or  . acetaminophen (TYLENOL) suppository 650 mg  650 mg Rectal Q6H PRN Lovett Calender, PA-C      . allopurinol (ZYLOPRIM) tablet 100 mg  100 mg Oral Daily Brittney Kelly, PA-C   100 mg at 08/04/15 1256  . amLODipine (NORVASC) tablet 10 mg  10 mg Oral Daily Lovett Calender, PA-C   Stopped at 08/04/15 1216  . calcium carbonate (TUMS - dosed  in mg elemental calcium) chewable tablet 400 mg of elemental calcium  2 tablet Oral TID WC Alric Seton, PA-C   400 mg of elemental calcium at 08/04/15 1257  . carvedilol (COREG) tablet 12.5 mg  12.5 mg Oral BID WC Lovett Calender, PA-C   Stopped at 08/04/15 1217  . celecoxib (CELEBREX) capsule 200 mg  200 mg Oral Q12H Brittney Kelly, PA-C   200 mg at 08/04/15 1255  . cinacalcet (SENSIPAR) tablet 60 mg  60 mg Oral BID WC Brittney Kelly, PA-C   60 mg at 08/04/15 1258  . colesevelam [facility]) tablet 1,875 mg  1,875 mg Oral BID WC Brittney Kelly, PA-C   1,875 mg at 08/04/15 1257  . docusate sodium (COLACE) capsule 100 mg  100 mg Oral BID Lovett Calender, PA-C   100 mg at 08/04/15 1258  . doxercalciferol ([name]) 4 MCG/2ML injection           . doxercalciferol ([name]) injection 5 mcg  5 mcg Intravenous Q T,Th,Sa-HD Alric Seton, PA-C   5 mcg at 08/04/15 1011  . enoxaparin (LOVENOX) injection 30 mg  30 mg Subcutaneous Q24H Brittney Kelly, PA-C   30 mg at 08/04/15 1507  . ezetimibe (ZETIA) tablet 10 mg  10 mg Oral Daily Brittney Kelly, PA-C   10 mg at 08/04/15 1258  . fentaNYL (SUBLIMAZE) injection 100 mcg  100 mcg Intravenous Once Oleta Mouse, MD      . gabapentin (NEURONTIN) capsule 300 mg  300 mg Oral QHS Brittney Kelly, PA-C   300 mg at 08/03/15 2148  . lanthanum (FOSRENOL) chewable tablet 1,000 mg  1,000 mg Oral BID WC Brittney Kelly, PA-C   1,000 mg at 08/04/15 1259  . menthol-cetylpyridinium (CEPACOL) lozenge 3 mg  1 lozenge Oral PRN Lovett Calender, PA-C       Or  . phenol (CHLORASEPTIC) mouth spray 1 spray  1 spray Mouth/Throat PRN Brittney Kelly, PA-C      . methocarbamol (ROBAXIN) tablet 500 mg  500 mg Oral Q6H PRN Lovett Calender, PA-C   500 mg at 08/04/15 1506   Or  . methocarbamol (ROBAXIN) 500 mg in dextrose 5 % 50 mL IVPB  500 mg Intravenous Q6H PRN Lovett Calender, PA-C   500 mg at 08/02/15 1331  . metoCLOPramide (REGLAN) tablet 5-10 mg  5-10 mg Oral Q8H PRN Lovett Calender,  PA-C       Or  . metoCLOPramide (REGLAN) injection 5-10 mg  5-10 mg Intravenous Q8H PRN Lovett Calender, PA-C      . multivitamin (RENA-VIT) tablet 1 tablet  1 tablet Oral Daily Lovett Calender, PA-C   1 tablet at 08/04/15 1258  . nateglinide (STARLIX) tablet 60 mg  60 mg Oral TID WC Brittney Kelly, PA-C   60 mg at 08/04/15 1256  . ondansetron (ZOFRAN) tablet 4 mg  4 mg Oral Q6H PRN Lovett Calender, PA-C       Or  . ondansetron (ZOFRAN) injection 4 mg  4 mg Intravenous Q6H PRN Lovett Calender, PA-C      . oxyCODONE (Oxy IR/ROXICODONE) immediate release tablet 5-10 mg  5-10 mg Oral Q4H Brittney Kelly, PA-C   5 mg at 08/04/15 1506  . pantoprazole (PROTONIX) EC tablet 40 mg  40 mg Oral Daily Brittney Kelly, PA-C   40 mg at 08/04/15 1257  . polyethylene glycol (MIRALAX / GLYCOLAX) packet 17 g  17 g Oral Daily PRN Brittney Kelly, PA-C      . pregabalin (LYRICA) capsule 100 mg  100 mg Oral Daily PRN Lovett Calender, PA-C   100 mg at 08/04/15 0704   Do not use this list as official medication orders. Please verify with discharge summary.  Discharge Medications:   Medication List    STOP taking these medications        aspirin EC 81 MG tablet      TAKE these medications        allopurinol 100 MG tablet  Commonly known as:  ZYLOPRIM  Take 1 tablet (100 mg total) by mouth daily.     amLODipine 10 MG tablet  Commonly known as:  NORVASC  Take 10 mg by mouth daily.     calcium-vitamin D 500-200 MG-UNIT tablet  Commonly known as:  OSCAL WITH D  Take 3 tablets by mouth 3 (three) times daily with meals.     carvedilol 25 MG tablet  Commonly known as:  COREG  Take 0.5 tablets (12.5 mg total) by mouth 2 (two) times daily with a meal. Only on non-HD days     cinacalcet 60 MG tablet  Commonly known as:  SENSIPAR  Take 60 mg by mouth 2 (two) times daily.     colesevelam 625 MG tablet  Commonly known as:  WELCHOL  Take 3 tablets (1,875 mg total) by mouth 2 (two) times daily with a meal.      DEXILANT 60 MG capsule  Generic drug:  dexlansoprazole  Take 1 tablet by mouth daily as needed (acid reflux).     docusate sodium 100 MG capsule  Commonly known as:  COLACE  Take 1 capsule (100 mg total) by mouth 2 (two) times daily.     enoxaparin 100 MG/ML injection  Commonly known as:  LOVENOX  Inject 0.3 mLs (30 mg total) into the skin daily.     ezetimibe 10 MG tablet  Commonly known as:  ZETIA  Take 1 tablet (10 mg total) by mouth daily.     gabapentin 300 MG capsule  Commonly known as:  NEURONTIN  Take 1 capsule (300 mg total) by mouth at bedtime.     lanthanum 1000 MG chewable tablet  Commonly known as:  FOSRENOL  Chew 1,000 mg by mouth 2 times daily at 12 noon and 4 pm.     multivitamin Tabs tablet  Take 1 tablet by mouth daily.     nateglinide 60 MG tablet  Commonly known as:  STARLIX  Take 1 tablet (60 mg total) by mouth 3 (three) times daily with meals.     ondansetron 4 MG tablet  Commonly known as:  ZOFRAN  Take 1 tablet (4 mg total) by mouth every 8 (eight) hours as needed for nausea or vomiting.     oxyCODONE-acetaminophen 5-325 MG tablet  Commonly known as:  PERCOCET  Take 1-2 tablets by mouth every 4 (four) hours as needed for severe pain.     pregabalin 100 MG capsule  Commonly known as:  LYRICA  Take 100  mg by mouth daily as needed (nerve pain).        Relevant Imaging Results:  Relevant Lab Results:  Recent Labs    Additional Information Patient receives dialysis Tuesday, Thursday and Saturday  Nyeli Holtmeyer, Jones Broom, LCSWA

## 2015-08-04 NOTE — Addendum Note (Signed)
Addendum  created 08/04/15 1723 by Oleta Mouse, MD   Modules edited: Clinical Notes   Clinical Notes:  File: [id number]

## 2015-08-05 ENCOUNTER — Encounter: Payer: Self-pay | Admitting: Internal Medicine

## 2015-08-05 ENCOUNTER — Non-Acute Institutional Stay (SKILLED_NURSING_FACILITY): Payer: Medicare Other | Admitting: Internal Medicine

## 2015-08-05 DIAGNOSIS — M1990 Unspecified osteoarthritis, unspecified site: Secondary | ICD-10-CM | POA: Diagnosis not present

## 2015-08-05 DIAGNOSIS — R0789 Other chest pain: Secondary | ICD-10-CM | POA: Diagnosis not present

## 2015-08-05 DIAGNOSIS — Z881 Allergy status to other antibiotic agents status: Secondary | ICD-10-CM | POA: Diagnosis not present

## 2015-08-05 DIAGNOSIS — R1311 Dysphagia, oral phase: Secondary | ICD-10-CM | POA: Diagnosis not present

## 2015-08-05 DIAGNOSIS — I132 Hypertensive heart and chronic kidney disease with heart failure and with stage 5 chronic kidney disease, or end stage renal disease: Secondary | ICD-10-CM | POA: Diagnosis present

## 2015-08-05 DIAGNOSIS — G9340 Encephalopathy, unspecified: Secondary | ICD-10-CM | POA: Diagnosis not present

## 2015-08-05 DIAGNOSIS — G4733 Obstructive sleep apnea (adult) (pediatric): Secondary | ICD-10-CM | POA: Diagnosis present

## 2015-08-05 DIAGNOSIS — I5032 Chronic diastolic (congestive) heart failure: Secondary | ICD-10-CM

## 2015-08-05 DIAGNOSIS — E1122 Type 2 diabetes mellitus with diabetic chronic kidney disease: Secondary | ICD-10-CM | POA: Diagnosis present

## 2015-08-05 DIAGNOSIS — I2510 Atherosclerotic heart disease of native coronary artery without angina pectoris: Secondary | ICD-10-CM | POA: Diagnosis not present

## 2015-08-05 DIAGNOSIS — G92 Toxic encephalopathy: Secondary | ICD-10-CM | POA: Diagnosis present

## 2015-08-05 DIAGNOSIS — R488 Other symbolic dysfunctions: Secondary | ICD-10-CM | POA: Diagnosis not present

## 2015-08-05 DIAGNOSIS — E1340 Other specified diabetes mellitus with diabetic neuropathy, unspecified: Secondary | ICD-10-CM

## 2015-08-05 DIAGNOSIS — Z8249 Family history of ischemic heart disease and other diseases of the circulatory system: Secondary | ICD-10-CM | POA: Diagnosis not present

## 2015-08-05 DIAGNOSIS — D62 Acute posthemorrhagic anemia: Secondary | ICD-10-CM

## 2015-08-05 DIAGNOSIS — M6281 Muscle weakness (generalized): Secondary | ICD-10-CM | POA: Diagnosis not present

## 2015-08-05 DIAGNOSIS — I110 Hypertensive heart disease with heart failure: Secondary | ICD-10-CM

## 2015-08-05 DIAGNOSIS — T402X5A Adverse effect of other opioids, initial encounter: Secondary | ICD-10-CM | POA: Diagnosis present

## 2015-08-05 DIAGNOSIS — R2681 Unsteadiness on feet: Secondary | ICD-10-CM | POA: Diagnosis not present

## 2015-08-05 DIAGNOSIS — R079 Chest pain, unspecified: Secondary | ICD-10-CM | POA: Diagnosis not present

## 2015-08-05 DIAGNOSIS — Z7901 Long term (current) use of anticoagulants: Secondary | ICD-10-CM | POA: Diagnosis not present

## 2015-08-05 DIAGNOSIS — D649 Anemia, unspecified: Secondary | ICD-10-CM | POA: Diagnosis not present

## 2015-08-05 DIAGNOSIS — M1711 Unilateral primary osteoarthritis, right knee: Secondary | ICD-10-CM | POA: Diagnosis not present

## 2015-08-05 DIAGNOSIS — N2581 Secondary hyperparathyroidism of renal origin: Secondary | ICD-10-CM | POA: Diagnosis present

## 2015-08-05 DIAGNOSIS — Z9181 History of falling: Secondary | ICD-10-CM | POA: Diagnosis not present

## 2015-08-05 DIAGNOSIS — F0390 Unspecified dementia without behavioral disturbance: Secondary | ICD-10-CM | POA: Diagnosis present

## 2015-08-05 DIAGNOSIS — Z7984 Long term (current) use of oral hypoglycemic drugs: Secondary | ICD-10-CM | POA: Diagnosis not present

## 2015-08-05 DIAGNOSIS — E785 Hyperlipidemia, unspecified: Secondary | ICD-10-CM | POA: Diagnosis present

## 2015-08-05 DIAGNOSIS — R0602 Shortness of breath: Secondary | ICD-10-CM | POA: Diagnosis not present

## 2015-08-05 DIAGNOSIS — Z87891 Personal history of nicotine dependence: Secondary | ICD-10-CM | POA: Diagnosis not present

## 2015-08-05 DIAGNOSIS — Z992 Dependence on renal dialysis: Secondary | ICD-10-CM

## 2015-08-05 DIAGNOSIS — E119 Type 2 diabetes mellitus without complications: Secondary | ICD-10-CM | POA: Diagnosis present

## 2015-08-05 DIAGNOSIS — M109 Gout, unspecified: Secondary | ICD-10-CM | POA: Diagnosis present

## 2015-08-05 DIAGNOSIS — Z955 Presence of coronary angioplasty implant and graft: Secondary | ICD-10-CM | POA: Diagnosis not present

## 2015-08-05 DIAGNOSIS — D631 Anemia in chronic kidney disease: Secondary | ICD-10-CM | POA: Diagnosis not present

## 2015-08-05 DIAGNOSIS — K219 Gastro-esophageal reflux disease without esophagitis: Secondary | ICD-10-CM | POA: Diagnosis not present

## 2015-08-05 DIAGNOSIS — I120 Hypertensive chronic kidney disease with stage 5 chronic kidney disease or end stage renal disease: Secondary | ICD-10-CM | POA: Diagnosis not present

## 2015-08-05 DIAGNOSIS — Z471 Aftercare following joint replacement surgery: Secondary | ICD-10-CM | POA: Diagnosis not present

## 2015-08-05 DIAGNOSIS — Z96651 Presence of right artificial knee joint: Secondary | ICD-10-CM | POA: Diagnosis present

## 2015-08-05 DIAGNOSIS — N186 End stage renal disease: Secondary | ICD-10-CM | POA: Diagnosis not present

## 2015-08-05 DIAGNOSIS — E0590 Thyrotoxicosis, unspecified without thyrotoxic crisis or storm: Secondary | ICD-10-CM | POA: Diagnosis present

## 2015-08-05 DIAGNOSIS — Z79899 Other long term (current) drug therapy: Secondary | ICD-10-CM | POA: Diagnosis not present

## 2015-08-05 DIAGNOSIS — D509 Iron deficiency anemia, unspecified: Secondary | ICD-10-CM | POA: Diagnosis not present

## 2015-08-05 LAB — GLUCOSE, CAPILLARY: GLUCOSE-CAPILLARY: 80 mg/dL (ref 65–99)

## 2015-08-05 NOTE — Progress Notes (Signed)
[facility] Progress Note  Assessment: 1. S/P right total knee arthroplasty- per ortho - on lovenox 2. ESRD -TTS, doesn't get heparin w HD 3. Hypertension/volume - stable 4. Anemia - continue ESA per schedule - due for redose next week Hgb down to 10.6 >8.9  follow 5. Metabolic bone disease - Continue hectorol, fosrenol takes 2 ac- would like to change, sensipar and 1-2 tums ac- advised he needed to continue fosrenol for now - can consider change after d/c but I think that this is a good option for him; change Ca +D to tums 6. Nutrition - change to renal carb mod/multivit 7. Clots from AVF 11/3 - review of outpt records show that he required serial interventions most recently April 2016 and April 26 2015 at [facility].  Last intervention -had long inflow basilic segment stenosis 70 - 80% to < 30%, outflow stent patent with mild ingrowth along inflow 1/3, mild innominate stenosis but not enough to angioplasty    Plan - for dc today.     Kelly Splinter MD [facility] pager [phone]    cell [phone] 08/05/2015, 12:40 PM    Subjective:   Right knee pain a little better  Objective Filed Vitals:   08/04/15 1500 08/04/15 2200 08/05/15 0558 08/05/15 1148  BP: 101/52 103/47 95/54 116/69  Pulse: 82 84 75 78  Temp: 98.4 F (36.9 C) 98.9 F (37.2 C) 98.5 F (36.9 C) 98.5 F (36.9 C)  TempSrc: Oral Oral Oral Oral  Resp: 18 18 18 16   Height:      Weight:      SpO2: 93% 94% 94% 98%   Physical Exam using CAP while on HD to help him sleep General: NAD Heart: RRR Lungs: no rales Abdomen: obese soft Extremities:right LE leg immobilizer LLE no edema Dialysis Access: right upper AVF - Qb 300 - RN pulling clots this am   Dialysis Orders: Center: [facility] 4 hr 180 107.5 2 K 2.25 Ca right upper AVF 450/800 profile 4 hectorol 5 mircera 50 q 2 weeks -last given 10/27 no heparin no IV Fe Most Recent Phos / PTH: 6.2/ 574 Most Recent Hgb/TSAT / Ferritin:  11.7/33%/1573 Most Recent eKT/V: 1.59 Treatment Adherence: excellent  Additional Objective Labs: Basic Metabolic Panel:  Recent Labs Lab 08/02/15 0833 08/02/15 1909 08/04/15 0745  NA 139  --  139  K 3.9  --  5.1  CL  --   --  98*  CO2  --   --  26  GLUCOSE 117*  --  97  BUN  --   --  63*  CREATININE  --  7.70* 11.71*  CALCIUM  --   --  9.5  PHOS  --   --  7.0*  CBC:  Recent Labs Lab 08/02/15 0833 08/02/15 1909 08/04/15 0745  WBC  --  8.7 10.6*  HGB 12.9* 10.6* 8.9*  HCT 38.0* 35.2* 28.2*  MCV  --  92.9 89.8  PLT  --  226 225   CBG:  Recent Labs Lab 08/02/15 0811 08/02/15 1307 08/04/15 0646 08/05/15 1208  GLUCAP 114* 94 95 80    Studies/Results: No results found. Medications:   . allopurinol  100 mg Oral Daily  . amLODipine  10 mg Oral Daily  . calcium carbonate  2 tablet Oral TID WC  . carvedilol  12.5 mg Oral BID WC  . celecoxib  200 mg Oral Q12H  . cinacalcet  60 mg Oral BID WC  . colesevelam  1,875 mg Oral BID WC  . docusate sodium  100 mg Oral BID  . doxercalciferol  5 mcg Intravenous Q T,Th,Sa-HD  . enoxaparin (LOVENOX) injection  30 mg Subcutaneous Q24H  . ezetimibe  10 mg Oral Daily  . fentaNYL (SUBLIMAZE) injection  100 mcg Intravenous Once  . gabapentin  300 mg Oral QHS  . lanthanum  1,000 mg Oral BID WC  . multivitamin  1 tablet Oral Daily  . nateglinide  60 mg Oral TID WC  . oxyCODONE  5-10 mg Oral Q4H  . pantoprazole  40 mg Oral Daily

## 2015-08-05 NOTE — Discharge Summary (Signed)
Physician Discharge Summary  Patient ID: [name] MRN: [id number] DOB/AGE: Feb 18, 1951 64 y.o.  Admit date: 08/02/2015 Discharge date: 08/05/2015  Admission Diagnoses:  Primary osteoarthritis of right knee  Discharge Diagnoses:  Principal Problem:   Primary osteoarthritis of right knee Active Problems:   Diabetes ([place])   ESRD on dialysis ([place])   Arthritis of knee   Neuropathy due to secondary diabetes mellitus ([place])   Past Medical History  Diagnosis Date  . Hyperthyroidism   . GASTROENTERITIS, VIRAL 10/14/2009  . ONYCHOMYCOSIS, TOENAILS 12/26/2007  . GOITER, MULTINODULAR 12/26/2007  . HYPERTHYROIDISM 02/02/2010  . DIABETES MELLITUS, TYPE II 02/01/2010  . DYSLIPIDEMIA 06/18/2007  . GOUT 06/18/2007  . Hypocalcemia 06/07/2010  . DEPRESSION 10/14/2009  . PERIPHERAL NEUROPATHY 06/18/2007  . HYPERTENSION 06/18/2007  . CONGESTIVE HEART FAILURE 06/18/2007  . DIASTOLIC HEART FAILURE, CHRONIC 02/06/2009  . Unspecified hypotension 01/30/2010  . PULMONARY NODULE, RIGHT LOWER LOBE 06/08/2009  . GERD 06/18/2007  . BENIGN PROSTATIC HYPERTROPHY 10/14/2009  . GYNECOMASTIA 07/17/2010  . NECK PAIN 07/31/2010  . FOOT PAIN 08/12/2008  . DIZZINESS 07/17/2010  . Other malaise and fatigue 11/24/2009  . GAIT DISTURBANCE 03/03/2010  . DYSPNEA 10/29/2008  . CHEST PAIN 03/29/2010  . TRANSAMINASES, SERUM, ELEVATED 02/01/2010  . COLONIC POLYPS, HX OF 10/14/2009  . Anemia 06/16/2011  . Ischemic cardiomyopathy 06/16/2011  . Hyperparathyroidism, secondary ([place]) 06/16/2011  . OSA on CPAP 10/16/2011  . Hyperlipidemia 10/16/2011  . CAD, NATIVE VESSEL 02/06/2009    saw Dr. Missy Sabins last jan 2013  . Sleep apnea     cpap machine and o2  . Allergic rhinitis, cause unspecified 02/24/2014  . Transfusion history     none recent  . Hemodialysis access, fistula [facility])     Dialysis T-Th-Sa ([place]) Right upper arm fistula  . ESRD (end stage renal disease) on dialysis ([place]) 08/04/2010    "TTS;  " (04/18/2015)   . Dementia [id number]    Surgeries: Procedure(s): TOTAL KNEE ARTHROPLASTY on 08/02/2015   Consultants (if any): Treatment Team:  [name], MD  Discharged Condition: Improved  Hospital Course: [name] is an 64 y.o. male who was admitted 08/02/2015 with a diagnosis of Primary osteoarthritis of right knee and went to the operating room on 08/02/2015 and underwent the above named procedures.    He was given perioperative antibiotics:      Anti-infectives    Start     Dose/Rate Route Frequency Ordered Stop   08/02/15 1521  clindamycin (CLEOCIN) 600 MG/50ML IVPB    Comments:  Kendell Bane   : cabinet override      08/02/15 1521 08/03/15 0329   08/02/15 1415  clindamycin (CLEOCIN) IVPB 600 mg     600 mg 100 mL/hr over 30 Minutes Intravenous Every 6 hours 08/02/15 1409 08/02/15 2108   08/02/15 0900  clindamycin (CLEOCIN) IVPB 900 mg     900 mg 100 mL/hr over 30 Minutes Intravenous To ShortStay Surgical 08/01/15 1030 08/02/15 1045   08/02/15 0900  vancomycin (VANCOCIN) 1,500 mg in sodium chloride 0.9 % 500 mL IVPB     1,500 mg 250 mL/hr over 120 Minutes Intravenous To Surgery 08/01/15 1030 08/02/15 1100    .  He was given sequential compression devices, early ambulation, and Lovenox for DVT prophylaxis.  He benefited maximally from the hospital stay and there were no complications.    Recent vital signs:  Filed Vitals:   08/05/15 0558  BP: 95/54  Pulse: 75  Temp:  98.5 F (36.9 C)  Resp: 18    Recent laboratory studies:  Lab Results  Component Value Date   HGB 8.9* 08/04/2015   HGB 10.6* 08/02/2015   HGB 12.9* 08/02/2015   Lab Results  Component Value Date   WBC 10.6* 08/04/2015   PLT 225 08/04/2015   Lab Results  Component Value Date   INR 1.04 07/20/2015   Lab Results  Component Value Date   NA 139 08/04/2015   K 5.1 08/04/2015   CL 98* 08/04/2015   CO2 26 08/04/2015   BUN 63* 08/04/2015   CREATININE 11.71* 08/04/2015   GLUCOSE 97  08/04/2015    Discharge Medications:     Medication List    STOP taking these medications        aspirin EC 81 MG tablet      TAKE these medications        allopurinol 100 MG tablet  Commonly known as:  ZYLOPRIM  Take 1 tablet (100 mg total) by mouth daily.     amLODipine 10 MG tablet  Commonly known as:  NORVASC  Take 10 mg by mouth daily.     calcium-vitamin D 500-200 MG-UNIT tablet  Commonly known as:  OSCAL WITH D  Take 3 tablets by mouth 3 (three) times daily with meals.     carvedilol 25 MG tablet  Commonly known as:  COREG  Take 0.5 tablets (12.5 mg total) by mouth 2 (two) times daily with a meal. Only on non-HD days     cinacalcet 60 MG tablet  Commonly known as:  SENSIPAR  Take 60 mg by mouth 2 (two) times daily.     colesevelam 625 MG tablet  Commonly known as:  WELCHOL  Take 3 tablets (1,875 mg total) by mouth 2 (two) times daily with a meal.     DEXILANT 60 MG capsule  Generic drug:  dexlansoprazole  Take 1 tablet by mouth daily as needed (acid reflux).     docusate sodium 100 MG capsule  Commonly known as:  COLACE  Take 1 capsule (100 mg total) by mouth 2 (two) times daily.     enoxaparin 100 MG/ML injection  Commonly known as:  LOVENOX  Inject 0.3 mLs (30 mg total) into the skin daily.     ezetimibe 10 MG tablet  Commonly known as:  ZETIA  Take 1 tablet (10 mg total) by mouth daily.     gabapentin 300 MG capsule  Commonly known as:  NEURONTIN  Take 1 capsule (300 mg total) by mouth at bedtime.     lanthanum 1000 MG chewable tablet  Commonly known as:  FOSRENOL  Chew 1,000 mg by mouth 2 times daily at 12 noon and 4 pm.     multivitamin Tabs tablet  Take 1 tablet by mouth daily.     nateglinide 60 MG tablet  Commonly known as:  STARLIX  Take 1 tablet (60 mg total) by mouth 3 (three) times daily with meals.     ondansetron 4 MG tablet  Commonly known as:  ZOFRAN  Take 1 tablet (4 mg total) by mouth every 8 (eight) hours as needed for  nausea or vomiting.     oxyCODONE-acetaminophen 5-325 MG tablet  Commonly known as:  PERCOCET  Take 1-2 tablets by mouth every 4 (four) hours as needed for severe pain.     pregabalin 100 MG capsule  Commonly known as:  LYRICA  Take 100 mg by mouth daily as needed (nerve pain).  Diagnostic Studies: Dg Pelvis 1-2 Views  07/13/2015  CLINICAL DATA:  Pelvic pain EXAM: PELVIS - 1-2 VIEW COMPARISON:  None. FINDINGS: Single frontal view of the pelvis submitted. Postsurgical changes are noted lower lumbar spine. Study is limited by diffuse osteopenia and patient's large body habitus. No acute fracture or subluxation. Mild degenerative changes bilateral hip joints with mild narrowing superior hip joint space. Mild degenerative changes pubic symphysis. IMPRESSION: No acute fracture or subluxation.  Mild degenerative changes. Electronically Signed   By: Lahoma Crocker M.D.   On: 07/13/2015 14:47   Dg Knee Right Port  08/02/2015  CLINICAL DATA:  Right total knee arthroplasty. EXAM: PORTABLE RIGHT KNEE - 1-2 VIEW COMPARISON:  01/04/2015 FINDINGS: Right total knee arthroplasty identified. There is no evidence of subluxation or dislocation. Soft tissue postoperative changes are present. No complicating features are noted. IMPRESSION: Right total knee arthroplasty and postoperative changes. No definite complicating features. Electronically Signed   By: Margarette Canada M.D.   On: 08/02/2015 14:35    Disposition: 01-Home or Self Care  Discharge Instructions    Weight bearing as tolerated    Complete by:  As directed   Laterality:  right  Extremity:  Lower           Follow-up Information    Follow up with MURPHY, TIMOTHY D, MD In 10 days.   Specialty:  Orthopedic Surgery   Contact information:   [address] [id number] [phone]        Signed: Gae Dry 08/05/2015, 7:26 AM Cell [phone]

## 2015-08-05 NOTE — Clinical Social Work Note (Signed)
Patient to be d/c'ed today to [facility] for short term rehab.  Patient and family agreeable to plans will transport via ems RN to call report.  CSW notified patient's wife Brinley Leiner.  Evette Cristal, MSW, [place]

## 2015-08-05 NOTE — Clinical Social Work Note (Signed)
Clinical Social Work Assessment  Patient Details  Name: [name] MRN: [id number] Date of Birth: 06-10-1951  Date of referral:  08/04/15               Reason for consult:  Facility Placement                Permission sought to share information with:  Family Supports, Chartered certified accountant granted to share information::  Yes, Verbal Permission Granted  Name::     [name] patient's wife  Agency::  SNF admissions  Relationship::     Contact Information:     Housing/Transportation Living arrangements for the past 2 months:  Single Family Home Source of Information:  Patient, Spouse Patient Interpreter Needed:  None Criminal Activity/Legal Involvement Pertinent to Current Situation/Hospitalization:  No - Comment as needed Significant Relationships:  Spouse Lives with:  Spouse Do you feel safe going back to the place where you live?  Yes Need for family participation in patient care:  No (Coment)  Care giving concerns:  Patient and wife feel he needs some short term rehab before he can safely return back home.   Social Worker assessment / plan:  Patient is a 64 year old male who lives with his wife, patient is alert and oriented x4.  Patient was pleasant and talkative, patient's wife was at bedside.  Patient states he has never been to rehab before, CSW explained to patient and his wife what to expect, and answered questions that they had.  Patient was explained how insurance pays for snf placement, and how the SNF is for short term only.  Patient and family agreed to going to SNF for short term rehab, they did not have any other questions.  Employment status:  Retired Forensic scientist:  Medicare PT Recommendations:  [place] / Referral to community resources:     Patient/Family's Response to care:  Patient and wife agreeable to going to SNF for short term rehab.  Patient/Family's Understanding of and Emotional Response  to Diagnosis, Current Treatment, and Prognosis: Patient and wife understand diagnosis and current treatment plan. Emotional Assessment Appearance:  Appears stated age Attitude/Demeanor/Rapport:    Affect (typically observed):  Calm, Stable, Appropriate, Pleasant Orientation:  Oriented to Self, Oriented to Place, Oriented to  Time, Oriented to Situation Alcohol / Substance use:  Not Applicable Psych involvement (Current and /or in the community):  No (Comment)  Discharge Needs  Concerns to be addressed:  No discharge needs identified Readmission within the last 30 days:  No Current discharge risk:  None Barriers to Discharge:  No Barriers Identified   Ross Ludwig, LCSWA 08/05/2015, 7:54 AM

## 2015-08-05 NOTE — Progress Notes (Signed)
MRN: [id number] Name: [name]  Sex: male Age: 64 y.o. DOB: August 24, 1951  [place] #: [facility] Facility/Room: [place]: SNF Provider: Inocencio Homes D Emergency Contacts: Extended Emergency Contact Information Primary Emergency Contact: Modesto Charon Address: [address]          [place], [place] [address] Johnnette Litter of [place] Phone: [phone] Mobile Phone: [phone] Relation: Spouse Secondary Emergency Contact: Lowe,Curtis          [place], [place] [address] Johnnette Litter of [place] Phone: [phone] Work Phone: [phone] Mobile Phone: [phone] Relation: Son  Code Status:   Allergies: Cephalexin; Statins; and Ciprofloxacin  Chief Complaint  Patient presents with  . New Admit To SNF    HPI: Patient is 64 y.o. male with ESRD on dialysis, HTN, CHF, DM2, hyperthyroidism, HLD, gout who was admitted 11/1 -11/4 with a diagnosis of Primary osteoarthritis of right knee and went to the operating room on 08/02/2015 and underwent a total knee arthropathy.Pt is admitted to SNF for OT/PT. While at SNF pt will be followed for HTN, tx with norvasc and coreg, polyneuropathy, tx with neurontin and lyrica and GERD tx with dexilant.   Past Medical History  Diagnosis Date  . Hyperthyroidism   . GASTROENTERITIS, VIRAL 10/14/2009  . ONYCHOMYCOSIS, TOENAILS 12/26/2007  . GOITER, MULTINODULAR 12/26/2007  . HYPERTHYROIDISM 02/02/2010  . DIABETES MELLITUS, TYPE II 02/01/2010  . DYSLIPIDEMIA 06/18/2007  . GOUT 06/18/2007  . Hypocalcemia 06/07/2010  . DEPRESSION 10/14/2009  . PERIPHERAL NEUROPATHY 06/18/2007  . HYPERTENSION 06/18/2007  . CONGESTIVE HEART FAILURE 06/18/2007  . DIASTOLIC HEART FAILURE, CHRONIC 02/06/2009  . Unspecified hypotension 01/30/2010  . PULMONARY NODULE, RIGHT LOWER LOBE 06/08/2009  . GERD 06/18/2007  . BENIGN PROSTATIC HYPERTROPHY 10/14/2009  . GYNECOMASTIA 07/17/2010  . NECK PAIN 07/31/2010  . FOOT PAIN 08/12/2008  . DIZZINESS 07/17/2010  . Other malaise  and fatigue 11/24/2009  . GAIT DISTURBANCE 03/03/2010  . DYSPNEA 10/29/2008  . CHEST PAIN 03/29/2010  . TRANSAMINASES, SERUM, ELEVATED 02/01/2010  . COLONIC POLYPS, HX OF 10/14/2009  . Anemia 06/16/2011  . Ischemic cardiomyopathy 06/16/2011  . Hyperparathyroidism, secondary ([place]) 06/16/2011  . OSA on CPAP 10/16/2011  . Hyperlipidemia 10/16/2011  . CAD, NATIVE VESSEL 02/06/2009    saw Dr. Missy Sabins last jan 2013  . Sleep apnea     cpap machine and o2  . Allergic rhinitis, cause unspecified 02/24/2014  . Transfusion history     none recent  . Hemodialysis access, fistula mature [facility])     Dialysis T-Th-Sa ([name]) Right upper arm fistula  . ESRD (end stage renal disease) on dialysis ([place]) 08/04/2010    "TTS;  " (04/18/2015)  . Dementia [id number]    Past Surgical History  Procedure Laterality Date  . Coronary angioplasty with stent placement  06/11/2008  . Cervical spine surgery  2/09    "to repair nerve problems in my left arm"  . Back surgery  1998  . Esophagogastroduodenoscopy  09/28/2011    Procedure: ESOPHAGOGASTRODUODENOSCOPY (EGD);  Surgeon: Missy Sabins, MD;  Location: [facility] ENDOSCOPY;  Service: Endoscopy;  Laterality: N/A;  . Givens capsule study  09/30/2011    Procedure: GIVENS CAPSULE STUDY;  Surgeon: Jeryl Columbia, MD;  Location: [facility] ENDOSCOPY;  Service: Endoscopy;  Laterality: N/A;  . Cholecystectomy    . Coronary angioplasty with stent placement  06/2007    TAXUS stent to RCA/notes 01/31/2011  . Tonsillectomy    . Av fistula placement  11/07/2011  Procedure: INSERTION OF ARTERIOVENOUS (AV) GORE-TEX GRAFT ARM;  Surgeon: Tinnie Gens, MD;  Location: [place];  Service: Vascular;  Laterality: Left;  . Arteriovenous graft placement Right 2009    forearm/notes 02/01/2011  . Insertion of dialysis catheter Right 2014  . Insertion of dialysis catheter Left 02/11/2013    Procedure: INSERTION OF DIALYSIS CATHETER;  Surgeon: Conrad Alpine, MD;  Location: [place];  Service: Vascular;   Laterality: Left;  Ultrasound guided  . Removal of a dialysis catheter Right 02/11/2013    Procedure: REMOVAL OF A DIALYSIS CATHETER;  Surgeon: Conrad Reserve, MD;  Location: [place];  Service: Vascular;  Laterality: Right;  . Bascilic vein transposition Right 02/27/2013    Procedure: BASCILIC VEIN TRANSPOSITION;  Surgeon: Mal Misty, MD;  Location: [place];  Service: Vascular;  Laterality: Right;  Right Basilic Vein Transposition   . Shuntogram N/A 09/20/2011    Procedure: Earney Mallet;  Surgeon: Conrad , MD;  Location: [facility] CATH LAB;  Service: Cardiovascular;  Laterality: N/A;  . Venogram N/A 01/26/2013    Procedure: VENOGRAM;  Surgeon: Angelia Mould, MD;  Location: [facility] CATH LAB;  Service: Cardiovascular;  Laterality: N/A;  . Esophagogastroduodenoscopy N/A 04/07/2015    Procedure: ESOPHAGOGASTRODUODENOSCOPY (EGD);  Surgeon: Teena Irani, MD;  Location: [facility];  Service: Endoscopy;  Laterality: N/A;  . Savory dilation N/A 04/07/2015    Procedure: SAVORY DILATION;  Surgeon: Teena Irani, MD;  Location: [facility] ENDOSCOPY;  Service: Endoscopy;  Laterality: N/A;  . Foreign body removal  09/2003    via upper endoscopy/notes 02/12/2011  . Esophagogastroduodenoscopy N/A 04/19/2015    Procedure: ESOPHAGOGASTRODUODENOSCOPY (EGD);  Surgeon: Arta Silence, MD;  Location[facility] ENDOSCOPY;  Service: Endoscopy;  Laterality: N/A;  . Total knee arthroplasty Right 08/02/2015    Procedure: TOTAL KNEE ARTHROPLASTY;  Surgeon: Renette Butters, MD;  Location: [place];  Service: Orthopedics;  Laterality: Right;      Medication List       This list is accurate as of: 08/05/15 11:59 PM.  Always use your most recent med list.               allopurinol 100 MG tablet  Commonly known as:  ZYLOPRIM  Take 1 tablet (100 mg total) by mouth daily.     amLODipine 10 MG tablet  Commonly known as:  NORVASC  Take 10 mg by mouth daily.     calcium-vitamin D 500-200 MG-UNIT tablet  Commonly known as:  OSCAL WITH D  Take 3  tablets by mouth 3 (three) times daily with meals.     carvedilol 25 MG tablet  Commonly known as:  COREG  Take 0.5 tablets (12.5 mg total) by mouth 2 (two) times daily with a meal. Only on non-HD days     cinacalcet 60 MG tablet  Commonly known as:  SENSIPAR  Take 60 mg by mouth 2 (two) times daily.     colesevelam 625 MG tablet  Commonly known as:  WELCHOL  Take 3 tablets (1,875 mg total) by mouth 2 (two) times daily with a meal.     DEXILANT 60 MG capsule  Generic drug:  dexlansoprazole  Take 1 tablet by mouth daily as needed (acid reflux).     docusate sodium 100 MG capsule  Commonly known as:  COLACE  Take 1 capsule (100 mg total) by mouth 2 (two) times daily.     enoxaparin 100 MG/ML injection  Commonly known as:  LOVENOX  Inject 0.3 mLs (30  mg total) into the skin daily.     ezetimibe 10 MG tablet  Commonly known as:  ZETIA  Take 1 tablet (10 mg total) by mouth daily.     gabapentin 300 MG capsule  Commonly known as:  NEURONTIN  Take 1 capsule (300 mg total) by mouth at bedtime.     lanthanum 1000 MG chewable tablet  Commonly known as:  FOSRENOL  Chew 1,000 mg by mouth 2 times daily at 12 noon and 4 pm.     multivitamin Tabs tablet  Take 1 tablet by mouth daily.     nateglinide 60 MG tablet  Commonly known as:  STARLIX  Take 1 tablet (60 mg total) by mouth 3 (three) times daily with meals.     ondansetron 4 MG tablet  Commonly known as:  ZOFRAN  Take 1 tablet (4 mg total) by mouth every 8 (eight) hours as needed for nausea or vomiting.     oxyCODONE-acetaminophen 5-325 MG tablet  Commonly known as:  PERCOCET  Take 1-2 tablets by mouth every 4 (four) hours as needed for severe pain.     pregabalin 100 MG capsule  Commonly known as:  LYRICA  Take 100 mg by mouth daily as needed (nerve pain).        No orders of the defined types were placed in this encounter.    Immunization History  Administered Date(s) Administered  . Influenza Split 07/16/2013   . Influenza Whole 08/12/2008  . Influenza,inj,Quad PF,36+ Mos 07/01/2014  . Influenza-Unspecified 07/04/2015  . Pneumococcal Conjugate-13 01/13/2014  . Pneumococcal Polysaccharide-23 08/31/2008    Social History  Substance Use Topics  . Smoking status: Former Smoker -- 1.00 packs/day for 25 years    Types: Cigarettes  . Smokeless tobacco: Former Systems developer    Quit date: 10/01/2005     Comment: Quit smoking 2007 Smoked x 25 years 1/2 ppd.  . Alcohol Use: 0.0 oz/week    0 Standard drinks or equivalent per week     Comment: occassional     Family history is + HD.   Review of Systems  DATA OBTAINED: from patient, nurse, multiple family members GENERAL:  no fevers, fatigue, appetite changes SKIN: No itching, rash EYES: No eye pain, redness, discharge EARS: No earache, tinnitus, change in hearing NOSE: No congestion, drainage or bleeding  MOUTH/THROAT: No mouth or tooth pain, No sore throat RESPIRATORY: No cough, wheezing, SOB CARDIAC: No chest pain, palpitations, lower extremity edema  GI: No abdominal pain, No N/V/D or constipation, No heartburn or reflux  GU: No dysuria, frequency or urgency, or incontinence  MUSCULOSKELETAL: No unrelieved bone/joint pain NEUROLOGIC: No headache, dizziness or focal weakness PSYCHIATRIC: No c/o anxiety or sadness   Filed Vitals:   08/05/15 2203  BP: 111/63  Pulse: 78  Temp: 97.5 F (36.4 C)  Resp: 16    SpO2 Readings from Last 1 Encounters:  08/05/15 98%        Physical Exam  GENERAL APPEARANCE: Alert, conversant,  No acute distress.  SKIN: No diaphoresis rash; incision with dressing;no abnormal heat or swelling HEAD: Normocephalic, atraumatic  EYES: Conjunctiva/lids clear. Pupils round, reactive. EOMs intact.  EARS: External exam WNL, canals clear. Hearing grossly normal.  NOSE: No deformity or discharge.  MOUTH/THROAT: Lips w/o lesions  RESPIRATORY: Breathing is even, unlabored. Lung sounds are clear   CARDIOVASCULAR: Heart  RRR no murmurs, rubs or gallops. Trace RLE peripheral edema.   GASTROINTESTINAL: Abdomen is soft, non-tender, not distended w/ normal bowel sounds.  GENITOURINARY: Bladder non tender, not distended  MUSCULOSKELETAL: No abnormal joints or musculature NEUROLOGIC:  Cranial nerves 2-12 grossly intact. Moves all extremities  PSYCHIATRIC: Mood and affect appropriate to situation, no behavioral issues  Patient Active Problem List   Diagnosis Date Noted  . Primary osteoarthritis of right knee 08/05/2015  . Neuropathy due to secondary diabetes mellitus ([place]) 08/05/2015  . Arthritis of knee 08/02/2015  . Headache 06/08/2015  . Melena 04/19/2015  . Acute blood loss anemia 04/18/2015  . Dialysis patient ([place]) 04/18/2015  . Hyperthyroidism 03/11/2015  . Thigh pain 12/08/2014  . PSA elevation 08/30/2014  . Allergic rhinitis, cause unspecified 02/24/2014  . Elevated PSA 02/24/2014  . Vertigo 10/14/2013  . Numbness 05/15/2013  . ESRD on dialysis ([place]) 04/06/2013  . Chronic systolic heart failure ([place]) 02/09/2013  . Other complications due to renal dialysis device, implant, and graft 01/14/2013  . Right hand pain 03/28/2012  . Dysphagia 03/28/2012  . Hypogonadism, male 03/28/2012  . OSA (obstructive sleep apnea) 10/16/2011  . Hyperlipidemia 10/16/2011  . CAD (coronary artery disease) 09/28/2011  . NSTEMI (non-ST elevated myocardial infarction) ([place]) 09/28/2011  . AVM (arteriovenous malformation) 09/27/2011  . Anemia associated with acute blood loss 09/26/2011  . Anemia 06/16/2011  . Ischemic cardiomyopathy 06/16/2011  . Hyperparathyroidism, secondary ([place]) 06/16/2011  . Preventative health care 03/11/2011  . NECK PAIN 07/31/2010  . GYNECOMASTIA 07/17/2010  . DIZZINESS 07/17/2010  . GAIT DISTURBANCE 03/03/2010  . Thyrotoxicosis 02/02/2010  . Diabetes ([place]) 02/01/2010  . TRANSAMINASES, SERUM, ELEVATED 02/01/2010  . Other malaise and fatigue 11/24/2009  . DEPRESSION 10/14/2009  . BENIGN  PROSTATIC HYPERTROPHY 10/14/2009  . COLONIC POLYPS, HX OF 10/14/2009  . Dementia 09/02/2009  . PULMONARY NODULE, RIGHT LOWER LOBE 06/08/2009  . DIASTOLIC HEART FAILURE, CHRONIC 02/06/2009  . DYSPNEA 10/29/2008  . FOOT PAIN 08/12/2008  . ONYCHOMYCOSIS, TOENAILS 12/26/2007  . GOITER, MULTINODULAR 12/26/2007  . GOUT 06/18/2007  . PERIPHERAL NEUROPATHY 06/18/2007  . Hypertensive heart disease with CHF (congestive heart failure) ([place]) 06/18/2007  . GERD 06/18/2007    CBC    Component Value Date/Time   WBC 10.6* 08/04/2015 0745   RBC 3.14* 08/04/2015 0745   HGB 8.9* 08/04/2015 0745   HCT 28.2* 08/04/2015 0745   PLT 225 08/04/2015 0745   MCV 89.8 08/04/2015 0745   LYMPHSABS 2.0 04/18/2015 1404   MONOABS 0.6 04/18/2015 1404   EOSABS 0.2 04/18/2015 1404   BASOSABS 0.1 04/18/2015 1404    CMP     Component Value Date/Time   NA 139 08/04/2015 0745   K 5.1 08/04/2015 0745   CL 98* 08/04/2015 0745   CO2 26 08/04/2015 0745   GLUCOSE 97 08/04/2015 0745   BUN 63* 08/04/2015 0745   CREATININE 11.71* 08/04/2015 0745   CALCIUM 9.5 08/04/2015 0745   PROT 6.7 04/19/2015 1106   ALBUMIN 2.9* 08/04/2015 0745   AST 20 04/19/2015 1106   ALT 24 04/19/2015 1106   ALKPHOS 91 04/19/2015 1106   BILITOT 0.8 04/19/2015 1106   GFRNONAA 4* 08/04/2015 0745   GFRAA 5* 08/04/2015 0745    Lab Results  Component Value Date   HGBA1C 6.1* 07/20/2015     No results found.  Not all labs, radiology exams or other studies done during hospitalization come through on my EPIC note; however they are reviewed by me.    Assessment and Plan  Primary osteoarthritis of right knee SNF - s/p total knee arthroplasty;lovenox as prophylaxis  Hypertensive heart disease with CHF (congestive heart  failure) ([place]) SNF - controlled on norvasc and coreg;cont both  DIASTOLIC HEART FAILURE, CHRONIC SNF - cont present meds- norvasc, coreg, pt with dialysis for fluid control  GERD SNF- cont dexilant 60 mg  daily  Neuropathy due to secondary diabetes mellitus ([place]) SNF - cont neurontin and lyrica   ESRD on dialysis ([place]) SNF - Tues, Thur, Sat dialysis-to continue  Acute blood loss anemia SNF - d/c Hb 8.9; will recheck with CBC   Time spent 45 min;> 50% of time with patient was spent reviewing records, labs, tests and studies, counseling and developing plan of care  Hennie Duos, MD

## 2015-08-05 NOTE — Progress Notes (Signed)
[name] discharged to SNF [facility]) per MD order. All questions and concerns answered. Copy of instructions and scripts sent with patient. Attempted to call and give report twice. Will await a phone call from SNF if needed.  Patient escorted by [facility]. No distress noted upon discharge.   Nicki Reaper [place] 08/05/2015 11:40 AM

## 2015-08-05 NOTE — Clinical Social Work Placement (Signed)
   CLINICAL SOCIAL WORK PLACEMENT  NOTE  Date:  08/05/2015  Patient Details  Name: [name] MRN: [id number] Date of Birth: 1950-10-19  Clinical Social Work is seeking post-discharge placement for this patient at the [place] level of care (*CSW will initial, date and re-position this form in  chart as items are completed):  Yes   Patient/family provided with [place] Work Department's list of facilities offering this level of care within the geographic area requested by the patient (or if unable, by the patient's family).  Yes   Patient/family informed of their freedom to choose among providers that offer the needed level of care, that participate in Medicare, Medicaid or managed care program needed by the patient, have an available bed and are willing to accept the patient.  Yes   Patient/family informed of [facility]'s ownership interest in [facility] and [facility], as well as of the fact that they are under no obligation to receive care at these facilities.  PASRR submitted to EDS on 08/04/15     PASRR number received on 08/04/15     Existing PASRR number confirmed on       FL2 transmitted to all facilities in geographic area requested by pt/family on 08/04/15     FL2 transmitted to all facilities within larger geographic area on       Patient informed that his/her managed care company has contracts with or will negotiate with certain facilities, including the following:        Yes   Patient/family informed of bed offers received.  Patient chooses bed at [facility] and [place] recommends and patient chooses bed at [facility]    Patient to be transferred to [facility] on 08/05/15.  Patient to be transferred to facility by [facility]     Patient family notified on 08/05/15 of transfer.  Name of family member notified:  Dravin Schellin, patient's wife     PHYSICIAN        Additional Comment:    _______________________________________________ Ross Ludwig, LCSWA 08/05/2015, 11:07 AM

## 2015-08-05 NOTE — Progress Notes (Signed)
PT Cancellation Note  Patient Details Name: [name] MRN: [id number] DOB: May 13, 1951   Cancelled Treatment:    Reason Eval/Treat Not Completed: Patient declined, no reason specified.  Will try later if pt is still here, discharge  is planned to SNF today.   Ramond Dial 08/05/2015, 11:34 AM   Mee Hives, PT MS Acute Rehab Dept. Number: [phone] [phone] and [place] [phone]

## 2015-08-05 NOTE — Care Management Important Message (Signed)
Important Message  Patient Details  Name: [name] MRN: [id number] Date of Birth: [birth year]   Medicare Important Message Given:  Yes-second notification given    Delorse Lek 08/05/2015, 1:33 PM

## 2015-08-06 DIAGNOSIS — E119 Type 2 diabetes mellitus without complications: Secondary | ICD-10-CM | POA: Diagnosis not present

## 2015-08-06 DIAGNOSIS — D631 Anemia in chronic kidney disease: Secondary | ICD-10-CM | POA: Diagnosis not present

## 2015-08-06 DIAGNOSIS — D509 Iron deficiency anemia, unspecified: Secondary | ICD-10-CM | POA: Diagnosis not present

## 2015-08-06 DIAGNOSIS — N2581 Secondary hyperparathyroidism of renal origin: Secondary | ICD-10-CM | POA: Diagnosis not present

## 2015-08-06 DIAGNOSIS — N186 End stage renal disease: Secondary | ICD-10-CM | POA: Diagnosis not present

## 2015-08-07 ENCOUNTER — Encounter: Payer: Self-pay | Admitting: Internal Medicine

## 2015-08-07 NOTE — Assessment & Plan Note (Signed)
SNF - s/p total knee arthroplasty;lovenox as prophylaxis

## 2015-08-07 NOTE — Assessment & Plan Note (Signed)
SNF- cont dexilant 60 mg daily

## 2015-08-07 NOTE — Assessment & Plan Note (Signed)
SNF - controlled on norvasc and coreg;cont both

## 2015-08-07 NOTE — Assessment & Plan Note (Signed)
SNF - cont present meds- norvasc, coreg, pt with dialysis for fluid control

## 2015-08-07 NOTE — Assessment & Plan Note (Signed)
SNF - cont neurontin and lyrica

## 2015-08-07 NOTE — Assessment & Plan Note (Signed)
SNF - Harrell Lark, Sat dialysis-to continue

## 2015-08-07 NOTE — Assessment & Plan Note (Signed)
SNF - d/c Hb 8.9; will recheck with CBC

## 2015-08-08 ENCOUNTER — Encounter (HOSPITAL_COMMUNITY): Payer: Self-pay | Admitting: Emergency Medicine

## 2015-08-08 ENCOUNTER — Telehealth: Payer: Self-pay | Admitting: *Deleted

## 2015-08-08 ENCOUNTER — Inpatient Hospital Stay (HOSPITAL_COMMUNITY)
Admission: EM | Admit: 2015-08-08 | Discharge: 2015-08-10 | DRG: 091 | Disposition: A | Discharge status: Skilled Nursing Facility | Payer: Medicare Other | Attending: Internal Medicine | Admitting: Internal Medicine

## 2015-08-08 ENCOUNTER — Emergency Department (HOSPITAL_COMMUNITY): Payer: Medicare Other

## 2015-08-08 DIAGNOSIS — R609 Edema, unspecified: Secondary | ICD-10-CM

## 2015-08-08 DIAGNOSIS — G4733 Obstructive sleep apnea (adult) (pediatric): Secondary | ICD-10-CM | POA: Diagnosis present

## 2015-08-08 DIAGNOSIS — F0390 Unspecified dementia without behavioral disturbance: Secondary | ICD-10-CM | POA: Diagnosis not present

## 2015-08-08 DIAGNOSIS — M109 Gout, unspecified: Secondary | ICD-10-CM | POA: Diagnosis present

## 2015-08-08 DIAGNOSIS — T402X5A Adverse effect of other opioids, initial encounter: Secondary | ICD-10-CM | POA: Diagnosis present

## 2015-08-08 DIAGNOSIS — G9340 Encephalopathy, unspecified: Secondary | ICD-10-CM | POA: Diagnosis present

## 2015-08-08 DIAGNOSIS — R0789 Other chest pain: Secondary | ICD-10-CM | POA: Diagnosis not present

## 2015-08-08 DIAGNOSIS — N2581 Secondary hyperparathyroidism of renal origin: Secondary | ICD-10-CM | POA: Diagnosis present

## 2015-08-08 DIAGNOSIS — Z96659 Presence of unspecified artificial knee joint: Secondary | ICD-10-CM

## 2015-08-08 DIAGNOSIS — R0602 Shortness of breath: Secondary | ICD-10-CM | POA: Diagnosis not present

## 2015-08-08 DIAGNOSIS — G92 Toxic encephalopathy: Principal | ICD-10-CM | POA: Diagnosis present

## 2015-08-08 DIAGNOSIS — I5032 Chronic diastolic (congestive) heart failure: Secondary | ICD-10-CM | POA: Diagnosis present

## 2015-08-08 DIAGNOSIS — E785 Hyperlipidemia, unspecified: Secondary | ICD-10-CM | POA: Diagnosis present

## 2015-08-08 DIAGNOSIS — Z7984 Long term (current) use of oral hypoglycemic drugs: Secondary | ICD-10-CM

## 2015-08-08 DIAGNOSIS — Z881 Allergy status to other antibiotic agents status: Secondary | ICD-10-CM

## 2015-08-08 DIAGNOSIS — N186 End stage renal disease: Secondary | ICD-10-CM | POA: Diagnosis not present

## 2015-08-08 DIAGNOSIS — E1122 Type 2 diabetes mellitus with diabetic chronic kidney disease: Secondary | ICD-10-CM | POA: Diagnosis present

## 2015-08-08 DIAGNOSIS — I132 Hypertensive heart and chronic kidney disease with heart failure and with stage 5 chronic kidney disease, or end stage renal disease: Secondary | ICD-10-CM | POA: Diagnosis present

## 2015-08-08 DIAGNOSIS — Z8249 Family history of ischemic heart disease and other diseases of the circulatory system: Secondary | ICD-10-CM

## 2015-08-08 DIAGNOSIS — D649 Anemia, unspecified: Secondary | ICD-10-CM | POA: Diagnosis present

## 2015-08-08 DIAGNOSIS — Z7901 Long term (current) use of anticoagulants: Secondary | ICD-10-CM

## 2015-08-08 DIAGNOSIS — E0590 Thyrotoxicosis, unspecified without thyrotoxic crisis or storm: Secondary | ICD-10-CM | POA: Diagnosis present

## 2015-08-08 DIAGNOSIS — R079 Chest pain, unspecified: Secondary | ICD-10-CM | POA: Diagnosis not present

## 2015-08-08 DIAGNOSIS — Z888 Allergy status to other drugs, medicaments and biological substances status: Secondary | ICD-10-CM

## 2015-08-08 DIAGNOSIS — Z9861 Coronary angioplasty status: Secondary | ICD-10-CM

## 2015-08-08 DIAGNOSIS — Z87891 Personal history of nicotine dependence: Secondary | ICD-10-CM

## 2015-08-08 DIAGNOSIS — I2510 Atherosclerotic heart disease of native coronary artery without angina pectoris: Secondary | ICD-10-CM | POA: Diagnosis present

## 2015-08-08 DIAGNOSIS — Z955 Presence of coronary angioplasty implant and graft: Secondary | ICD-10-CM

## 2015-08-08 DIAGNOSIS — Z992 Dependence on renal dialysis: Secondary | ICD-10-CM

## 2015-08-08 DIAGNOSIS — Z79899 Other long term (current) drug therapy: Secondary | ICD-10-CM

## 2015-08-08 DIAGNOSIS — Z96651 Presence of right artificial knee joint: Secondary | ICD-10-CM | POA: Diagnosis present

## 2015-08-08 LAB — I-STAT TROPONIN, ED: TROPONIN I, POC: 0.02 ng/mL (ref 0.00–0.08)

## 2015-08-08 NOTE — Telephone Encounter (Signed)
Pt was on TCM list D/c 08/05/15 he had (R) Knee Replacement. Will be f/u with surgeon Dr. Percell Miller...Johny Chess

## 2015-08-08 NOTE — ED Notes (Signed)
Per EMS: pt from [facility] due to recent right knee surgery, pt reports sudden onset of substernal cp tonight with sob. Upon arrival by EMS, pt pain free and with no sob- 324 mg of aspirin given and pt refused nitro. Pt dialysis pt -T/TH/SA. Pt denies any pain at this time. EMS also reports per pt wife pt has been more confused- pt alert and oriented x3- states the year is 2076. Able to follow commands, skin warm and dry.

## 2015-08-08 NOTE — ED Notes (Signed)
IV team remain at bedside

## 2015-08-08 NOTE — ED Notes (Signed)
MD at bedside. 

## 2015-08-08 NOTE — ED Provider Notes (Signed)
Arrival Date & Time: 08/08/15 & 2034 History   Chief Complaint  Patient presents with  . Shortness of Breath  . Chest Pain   HPI [name] is a 64 y.o. male who presents with endorsement of chest pain and shortness of breath that started approximately around noon with gradual onset shortness of breath and endorsement of chest pain since this afternoon approximately around 5:30 PM. Patient recently had complete knee arthroplasty and replacement on the right and was recovering in a rehabilitation facility. Patient has been maintained on amlodipine and Lovenox. Patient states that chest pain is endorsed as a mild pressure in sternal chest region with radiation into the left chest no pain in jaw or arms. Had associated nausea diaphoresis and a "generalized feeling of being unwell." Patient has past medical significant for high blood pressure known coronary artery disease and ischemic cardiomyopathy pulmonary nodule congestive heart failure diastolic, and is end-stage renal disease on hemodialysis with fistula in right upper extremity. Patient receives dialysis on Tuesday Thursday and Saturdays. Denies abdominal pain or back pain and denies fevers and chills.  Past Medical History  I reviewed & agree with nursing's documentation on PMHx, PSHx, SHx and FHx. Past Medical History  Diagnosis Date  . Hyperthyroidism   . GASTROENTERITIS, VIRAL 10/14/2009  . ONYCHOMYCOSIS, TOENAILS 12/26/2007  . GOITER, MULTINODULAR 12/26/2007  . HYPERTHYROIDISM 02/02/2010  . DIABETES MELLITUS, TYPE II 02/01/2010  . DYSLIPIDEMIA 06/18/2007  . GOUT 06/18/2007  . Hypocalcemia 06/07/2010  . DEPRESSION 10/14/2009  . PERIPHERAL NEUROPATHY 06/18/2007  . HYPERTENSION 06/18/2007  . CONGESTIVE HEART FAILURE 06/18/2007  . DIASTOLIC HEART FAILURE, CHRONIC 02/06/2009  . Unspecified hypotension 01/30/2010  . PULMONARY NODULE, RIGHT LOWER LOBE 06/08/2009  . GERD 06/18/2007  . BENIGN PROSTATIC HYPERTROPHY 10/14/2009  . GYNECOMASTIA 07/17/2010  .  NECK PAIN 07/31/2010  . FOOT PAIN 08/12/2008  . DIZZINESS 07/17/2010  . Other malaise and fatigue 11/24/2009  . GAIT DISTURBANCE 03/03/2010  . DYSPNEA 10/29/2008  . CHEST PAIN 03/29/2010  . TRANSAMINASES, SERUM, ELEVATED 02/01/2010  . COLONIC POLYPS, HX OF 10/14/2009  . Anemia 06/16/2011  . Ischemic cardiomyopathy 06/16/2011  . Hyperparathyroidism, secondary ([place]) 06/16/2011  . OSA on CPAP 10/16/2011  . Hyperlipidemia 10/16/2011  . CAD, NATIVE VESSEL 02/06/2009    saw Dr. Missy Sabins last jan 2013  . Sleep apnea     cpap machine and o2  . Allergic rhinitis, cause unspecified 02/24/2014  . Transfusion history     none recent  . Hemodialysis access, fistula [facility])     Dialysis T-Th-Sa ([place]) Right upper arm fistula  . ESRD (end stage renal disease) on dialysis ([place]) 08/04/2010    "TTS;  " (04/18/2015)  . Dementia [id number]   Past Surgical History  Procedure Laterality Date  . Coronary angioplasty with stent placement  06/11/2008  . Cervical spine surgery  2/09    "to repair nerve problems in my left arm"  . Back surgery  1998  . Esophagogastroduodenoscopy  09/28/2011    Procedure: ESOPHAGOGASTRODUODENOSCOPY (EGD);  Surgeon: Missy Sabins, MD;  Location: [facility] ENDOSCOPY;  Service: Endoscopy;  Laterality: N/A;  . Givens capsule study  09/30/2011    Procedure: GIVENS CAPSULE STUDY;  Surgeon: Jeryl Columbia, MD;  Location: [facility] ENDOSCOPY;  Service: Endoscopy;  Laterality: N/A;  . Cholecystectomy    . Coronary angioplasty with stent placement  06/2007    TAXUS stent to RCA/notes 01/31/2011  . Tonsillectomy    . Av fistula placement  11/07/2011    Procedure: INSERTION OF ARTERIOVENOUS (AV) GORE-TEX GRAFT ARM;  Surgeon: Tinnie Gens, MD;  Location: [place];  Service: Vascular;  Laterality: Left;  . Arteriovenous graft placement Right 2009    forearm/notes 02/01/2011  . Insertion of dialysis catheter Right 2014  . Insertion of dialysis catheter Left 02/11/2013    Procedure: INSERTION OF  DIALYSIS CATHETER;  Surgeon: Conrad Sealy, MD;  Location: [place];  Service: Vascular;  Laterality: Left;  Ultrasound guided  . Removal of a dialysis catheter Right 02/11/2013    Procedure: REMOVAL OF A DIALYSIS CATHETER;  Surgeon: Conrad [place], MD;  Location: [place];  Service: Vascular;  Laterality: Right;  . Bascilic vein transposition Right 02/27/2013    Procedure: BASCILIC VEIN TRANSPOSITION;  Surgeon: Mal Misty, MD;  Location: [place];  Service: Vascular;  Laterality: Right;  Right Basilic Vein Transposition   . Shuntogram N/A 09/20/2011    Procedure: Earney Mallet;  Surgeon: Conrad [place], MD;  Location: [facility] CATH LAB;  Service: Cardiovascular;  Laterality: N/A;  . Venogram N/A 01/26/2013    Procedure: VENOGRAM;  Surgeon: Angelia Mould, MD;  Location: [facility] CATH LAB;  Service: Cardiovascular;  Laterality: N/A;  . Esophagogastroduodenoscopy N/A 04/07/2015    Procedure: ESOPHAGOGASTRODUODENOSCOPY (EGD);  Surgeon: Teena Irani, MD;  Location: [facility] ENDOSCOPY;  Service: Endoscopy;  Laterality: N/A;  . Savory dilation N/A 04/07/2015    Procedure: SAVORY DILATION;  Surgeon: Teena Irani, MD;  Location: [facility] ENDOSCOPY;  Service: Endoscopy;  Laterality: N/A;  . Foreign body removal  09/2003    via upper endoscopy/notes 02/12/2011  . Esophagogastroduodenoscopy N/A 04/19/2015    Procedure: ESOPHAGOGASTRODUODENOSCOPY (EGD);  Surgeon: Arta Silence, MD;  Location: [facility] ENDOSCOPY;  Service: Endoscopy;  Laterality: N/A;  . Total knee arthroplasty Right 08/02/2015    Procedure: TOTAL KNEE ARTHROPLASTY;  Surgeon: Renette Butters, MD;  Location: [place];  Service: Orthopedics;  Laterality: Right;   Social History   Social History  . Marital Status: Married    Spouse Name: N/A  . Number of Children: N/A  . Years of Education: N/A   Occupational History  . disabled   . formerly Lawyer for [employer].    Social History Main Topics  . Smoking status: Former Smoker -- 1.00 packs/day for 25 years    Types:  Cigarettes  . Smokeless tobacco: Former Systems developer    Quit date: 10/01/2005     Comment: Quit smoking 2007 Smoked x 25 years 1/2 ppd.  . Alcohol Use: 0.0 oz/week    0 Standard drinks or equivalent per week     Comment: occassional   . Drug Use: No  . Sexual Activity: Not Asked   Other Topics Concern  . None   Social History Narrative   Family History  Problem Relation Age of Onset  . Heart disease Father   . Heart disease Sister     Review of Systems   Complete ROS performed by me, all positives & negatives documented as above in HPI. All other ROS negative.  Allergies  Cephalexin; Statins; and Ciprofloxacin  Home Medications   Prior to Admission medications   Medication Sig Start Date End Date Taking? Authorizing Provider  allopurinol (ZYLOPRIM) 100 MG tablet Take 1 tablet (100 mg total) by mouth daily. 06/08/15  Yes Biagio Borg, MD  amLODipine (NORVASC) 10 MG tablet Take 10 mg by mouth daily. 04/13/15  Yes Historical Provider, MD  calcium-vitamin D (OSCAL WITH D) 500-200 MG-UNIT per tablet Take  3 tablets by mouth 3 (three) times daily with meals.    Yes Historical Provider, MD  carvedilol (COREG) 12.5 MG tablet Take 12.5 mg by mouth 2 (two) times daily.    Yes Historical Provider, MD  cinacalcet (SENSIPAR) 60 MG tablet Take 60 mg by mouth 2 (two) times daily.    Yes Historical Provider, MD  colestipol (COLESTID) 1 G tablet Take 1 g by mouth 2 (two) times daily.   Yes Historical Provider, MD  DEXILANT 60 MG capsule Take 1 tablet by mouth daily as needed (acid reflux).  04/22/15  Yes Historical Provider, MD  docusate sodium (COLACE) 100 MG capsule Take 1 capsule (100 mg total) by mouth 2 (two) times daily. 08/02/15  Yes Brittney Kelly, PA-C  enoxaparin (LOVENOX) 100 MG/ML injection Inject 0.3 mLs (30 mg total) into the skin daily. 08/02/15  Yes Brittney Claiborne Billings, PA-C  ezetimibe (ZETIA) 10 MG tablet Take 1 tablet (10 mg total) by mouth daily. Patient taking differently: Take 10 mg by  mouth at bedtime.  12/29/14  Yes Jolaine Artist, MD  gabapentin (NEURONTIN) 300 MG capsule Take 1 capsule (300 mg total) by mouth at bedtime. 06/08/15  Yes Biagio Borg, MD  lanthanum (FOSRENOL) 1000 MG chewable tablet Chew 1,000 mg by mouth 2 times daily at 12 noon and 4 pm.   Yes Historical Provider, MD  multivitamin (RENA-VIT) TABS tablet Take 1 tablet by mouth daily.     Yes Historical Provider, MD  nateglinide (STARLIX) 60 MG tablet Take 1 tablet (60 mg total) by mouth 3 (three) times daily with meals. 03/14/15  Yes Renato Shin, MD  ondansetron (ZOFRAN) 4 MG tablet Take 1 tablet (4 mg total) by mouth every 8 (eight) hours as needed for nausea or vomiting. 08/02/15  Yes Brittney Kelly, PA-C  oxyCODONE (OXY IR/ROXICODONE) 5 MG immediate release tablet Take 5 mg by mouth every 4 (four) hours as needed for moderate pain or severe pain.   Yes Historical Provider, MD  pregabalin (LYRICA) 100 MG capsule Take 100 mg by mouth daily as needed (nerve pain).   Yes Historical Provider, MD  carvedilol (COREG) 25 MG tablet Take 0.5 tablets (12.5 mg total) by mouth 2 (two) times daily with a meal. Only on non-HD days Patient not taking: Reported on 08/08/2015 03/10/14   Jolaine Artist, MD  colesevelam [facility]) 625 MG tablet Take 3 tablets (1,875 mg total) by mouth 2 (two) times daily with a meal. Patient not taking: Reported on 08/08/2015 06/08/15   Biagio Borg, MD  oxyCODONE-acetaminophen (PERCOCET) 5-325 MG tablet Take 1-2 tablets by mouth every 4 (four) hours as needed for severe pain. Patient not taking: Reported on 08/08/2015 08/02/15   Lovett Calender, PA-C    Physical Exam  BP 132/94 mmHg  Pulse 79  Temp(Src) 98.9 F (37.2 C) (Oral)  Resp 15  Ht 6' (1.829 m)  Wt 235 lb (106.595 kg)  BMI 31.86 kg/m2  SpO2 98% Physical Exam  Constitutional: He is oriented to person, place, and time. He appears well-developed and well-nourished. He appears lethargic.  Non-toxic appearance. He has a sickly  appearance. He appears ill. No distress.  HENT:  Head: Normocephalic and atraumatic.  Right Ear: External ear normal.  Left Ear: External ear normal.  Eyes: Pupils are equal, round, and reactive to light. Right eye exhibits no discharge. Left eye exhibits no discharge.  Neck: Normal range of motion. Neck supple. JVD present. No tracheal deviation present.  Cardiovascular: Intact distal pulses.  Murmur heard. Pulmonary/Chest: Effort normal and breath sounds normal. No stridor. No respiratory distress. He has no wheezes. He has no rales.  Abdominal: Soft. Bowel sounds are normal. He exhibits no distension. There is no tenderness. There is no rebound and no guarding.  Musculoskeletal: Normal range of motion.       Right knee: He exhibits swelling, effusion and erythema. He exhibits normal range of motion, no ecchymosis and no deformity. No tenderness found.  RUE with palpable thrill and audible bruit present.  Neurological: He is oriented to person, place, and time. He has normal strength and normal reflexes. He appears lethargic. No cranial nerve deficit or sensory deficit.  Skin: Skin is warm and dry. No pallor.  Psychiatric: He has a normal mood and affect. His behavior is normal.  Nursing note and vitals reviewed.  ED Course  Procedures Labs Review Labs Reviewed  BASIC METABOLIC PANEL - Abnormal; Notable for the following:    Chloride 95 (*)    Glucose, Bld 141 (*)    BUN 86 (*)    Creatinine, Ser 12.82 (*)    GFR calc non Af Amer 3 (*)    GFR calc Af Amer 4 (*)    Anion gap 19 (*)    All other components within normal limits  CBC - Abnormal; Notable for the following:    RBC 3.39 (*)    Hemoglobin 9.3 (*)    HCT 30.0 (*)    RDW 17.9 (*)    All other components within normal limits  I-STAT CHEM 8, ED - Abnormal; Notable for the following:    Chloride 98 (*)    BUN 87 (*)    Creatinine, Ser 12.60 (*)    Glucose, Bld 139 (*)    Hemoglobin 10.5 (*)    HCT 31.0 (*)    All  other components within normal limits  BRAIN NATRIURETIC PEPTIDE  URINALYSIS, ROUTINE W REFLEX MICROSCOPIC (NOT AT [facility])  I-STAT TROPOININ, ED  I-STAT CG4 LACTIC ACID, ED   Imaging Review Dg Chest 2 View  08/08/2015  CLINICAL DATA:  Sudden onset of substernal chest pain tonight and shortness of breath. Recent knee surgery. EXAM: CHEST  2 VIEW COMPARISON:  06/26/2013 FINDINGS: The heart is borderline enlarged but stable. The mediastinal and hilar contours are within normal limits and unchanged. Mild tortuosity of the thoracic aorta. The lungs demonstrate streaky scarring changes but no infiltrates, edema or effusions. The bony thorax is intact. IMPRESSION: Chronic basilar scarring changes but no acute pulmonary findings. Electronically Signed   By: Marijo Sanes M.D.   On: 08/08/2015 21:11   Laboratory and Imaging results were personally reviewed by myself and used in the medical decision making of this patient's treatment and disposition.  EKG Interpretation  EKG Interpretation  Date/Time:  Monday August 08 2015 20:39:10 EST Ventricular Rate:  84 PR Interval:  186 QRS Duration: 107 QT Interval:  387 QTC Calculation: 457 R Axis:   78 Text Interpretation:  Sinus tachycardia Ventricular premature complex Borderline repolarization abnormality Baseline wander in lead(s) III aVL V1 V2 V3 V4 V5 V6 No significant change since last tracing Confirmed by Lonia Skinner ([id number]) on 08/08/2015 10:27:38 PM      MDM  [name] is a 64 y.o. male with H&P as above. ED clinical course as follows: Given patient's symptomatically endorsements concern had for high risk chest pain. Patient has not had recent stress test and has recent echo performed in March of 2 and 16  that showed EF of 50-55%. No hypokinesis or regional wall abnormalities however most recent stress test per review of patient's past medical records reveals last stress test performed in 2014 was inconclusive due to patient fatigue and  intolerance of tests from knee pain bilaterally. Concern had 4 septic joint versus uncomplicated infection versus sepsis however patient's laboratory work reveals no leukocytosis lactic acid is within normal limits and patient's mental status baseline. patient's EKG shows normal sinus rhythm with occasional PVCs. Patient's troponin obtained and is within normal limits. I obtained consultation from the hospitalist service and after we discussed patient's ED clinical course and imaging to this point decision was made to obtain a CT pulmonary angiogram to look for PE given patient's high risk of recent surgery. Patient is end-stage renal disease and will be most likely dialyzed per the hospitalist therefore decision was made to scan patient and admitting service will contact nephrology team for dialysis tomorrow. Patient received aspirin 325 mg prior to arrival in emergency department and patient has no longer symptomatically of chest pain therefore requires no further interventions blood pressure appropriate vital signs stable and patient is appropriate for admission to floor at this time.  CTA PE study per the hospital team will be followed up by their service as this study is pending at the time of patient's admission.  Disposition: Admit   Clinical Impression: No diagnosis found. Patient care discussed with Dr. Alfonse Spruce, who oversaw their evaluation & treatment & voiced agreement. House Officer: Voncille Lo, MD, Emergency Medicine.  Voncille Lo, MD 08/09/15 [id number]  Harvel Quale, MD 08/10/15 [phone]

## 2015-08-08 NOTE — ED Notes (Signed)
Patient transported to X-ray 

## 2015-08-08 NOTE — ED Notes (Signed)
Unsuccessful IV attempt x2. Phlebotomy notified.

## 2015-08-08 NOTE — ED Notes (Signed)
IV team at bedside 

## 2015-08-08 NOTE — ED Notes (Signed)
Md notified that IV team was unable to start line or obtain blood work

## 2015-08-09 DIAGNOSIS — Z8249 Family history of ischemic heart disease and other diseases of the circulatory system: Secondary | ICD-10-CM | POA: Diagnosis not present

## 2015-08-09 DIAGNOSIS — I132 Hypertensive heart and chronic kidney disease with heart failure and with stage 5 chronic kidney disease, or end stage renal disease: Secondary | ICD-10-CM | POA: Diagnosis present

## 2015-08-09 DIAGNOSIS — G4733 Obstructive sleep apnea (adult) (pediatric): Secondary | ICD-10-CM | POA: Diagnosis present

## 2015-08-09 DIAGNOSIS — D649 Anemia, unspecified: Secondary | ICD-10-CM | POA: Diagnosis present

## 2015-08-09 DIAGNOSIS — E785 Hyperlipidemia, unspecified: Secondary | ICD-10-CM | POA: Diagnosis present

## 2015-08-09 DIAGNOSIS — R4182 Altered mental status, unspecified: Secondary | ICD-10-CM | POA: Diagnosis not present

## 2015-08-09 DIAGNOSIS — D631 Anemia in chronic kidney disease: Secondary | ICD-10-CM | POA: Diagnosis not present

## 2015-08-09 DIAGNOSIS — F0390 Unspecified dementia without behavioral disturbance: Secondary | ICD-10-CM | POA: Diagnosis present

## 2015-08-09 DIAGNOSIS — T402X5A Adverse effect of other opioids, initial encounter: Secondary | ICD-10-CM | POA: Diagnosis present

## 2015-08-09 DIAGNOSIS — Z881 Allergy status to other antibiotic agents status: Secondary | ICD-10-CM | POA: Diagnosis not present

## 2015-08-09 DIAGNOSIS — Z96659 Presence of unspecified artificial knee joint: Secondary | ICD-10-CM

## 2015-08-09 DIAGNOSIS — Z992 Dependence on renal dialysis: Secondary | ICD-10-CM | POA: Diagnosis not present

## 2015-08-09 DIAGNOSIS — Z96651 Presence of right artificial knee joint: Secondary | ICD-10-CM | POA: Diagnosis present

## 2015-08-09 DIAGNOSIS — M109 Gout, unspecified: Secondary | ICD-10-CM | POA: Diagnosis present

## 2015-08-09 DIAGNOSIS — R1311 Dysphagia, oral phase: Secondary | ICD-10-CM | POA: Diagnosis not present

## 2015-08-09 DIAGNOSIS — N186 End stage renal disease: Secondary | ICD-10-CM | POA: Diagnosis not present

## 2015-08-09 DIAGNOSIS — I2510 Atherosclerotic heart disease of native coronary artery without angina pectoris: Secondary | ICD-10-CM | POA: Diagnosis present

## 2015-08-09 DIAGNOSIS — R079 Chest pain, unspecified: Secondary | ICD-10-CM | POA: Diagnosis present

## 2015-08-09 DIAGNOSIS — R262 Difficulty in walking, not elsewhere classified: Secondary | ICD-10-CM | POA: Diagnosis not present

## 2015-08-09 DIAGNOSIS — E1122 Type 2 diabetes mellitus with diabetic chronic kidney disease: Secondary | ICD-10-CM | POA: Diagnosis present

## 2015-08-09 DIAGNOSIS — R488 Other symbolic dysfunctions: Secondary | ICD-10-CM | POA: Diagnosis not present

## 2015-08-09 DIAGNOSIS — R609 Edema, unspecified: Secondary | ICD-10-CM | POA: Diagnosis not present

## 2015-08-09 DIAGNOSIS — Z7901 Long term (current) use of anticoagulants: Secondary | ICD-10-CM | POA: Diagnosis not present

## 2015-08-09 DIAGNOSIS — I120 Hypertensive chronic kidney disease with stage 5 chronic kidney disease or end stage renal disease: Secondary | ICD-10-CM | POA: Diagnosis not present

## 2015-08-09 DIAGNOSIS — M25561 Pain in right knee: Secondary | ICD-10-CM | POA: Diagnosis not present

## 2015-08-09 DIAGNOSIS — R0902 Hypoxemia: Secondary | ICD-10-CM | POA: Diagnosis not present

## 2015-08-09 DIAGNOSIS — E0590 Thyrotoxicosis, unspecified without thyrotoxic crisis or storm: Secondary | ICD-10-CM

## 2015-08-09 DIAGNOSIS — G92 Toxic encephalopathy: Secondary | ICD-10-CM | POA: Diagnosis present

## 2015-08-09 DIAGNOSIS — Z9181 History of falling: Secondary | ICD-10-CM | POA: Diagnosis not present

## 2015-08-09 DIAGNOSIS — Z7984 Long term (current) use of oral hypoglycemic drugs: Secondary | ICD-10-CM | POA: Diagnosis not present

## 2015-08-09 DIAGNOSIS — Z955 Presence of coronary angioplasty implant and graft: Secondary | ICD-10-CM | POA: Diagnosis not present

## 2015-08-09 DIAGNOSIS — M6281 Muscle weakness (generalized): Secondary | ICD-10-CM | POA: Diagnosis not present

## 2015-08-09 DIAGNOSIS — G9340 Encephalopathy, unspecified: Secondary | ICD-10-CM | POA: Diagnosis present

## 2015-08-09 DIAGNOSIS — N2581 Secondary hyperparathyroidism of renal origin: Secondary | ICD-10-CM | POA: Diagnosis not present

## 2015-08-09 DIAGNOSIS — Z471 Aftercare following joint replacement surgery: Secondary | ICD-10-CM | POA: Diagnosis not present

## 2015-08-09 DIAGNOSIS — I5032 Chronic diastolic (congestive) heart failure: Secondary | ICD-10-CM | POA: Diagnosis present

## 2015-08-09 DIAGNOSIS — Z87891 Personal history of nicotine dependence: Secondary | ICD-10-CM | POA: Diagnosis not present

## 2015-08-09 DIAGNOSIS — Z79899 Other long term (current) drug therapy: Secondary | ICD-10-CM | POA: Diagnosis not present

## 2015-08-09 HISTORY — DX: Chest pain, unspecified: R07.9

## 2015-08-09 LAB — TROPONIN I
Troponin I: 0.03 ng/mL (ref <0.031)
Troponin I: 0.04 ng/mL — ABNORMAL HIGH (ref <0.031)

## 2015-08-09 LAB — BASIC METABOLIC PANEL
ANION GAP: 19 — AB (ref 5–15)
BUN: 86 mg/dL — AB (ref 6–20)
CO2: 26 mmol/L (ref 22–32)
Calcium: 10.1 mg/dL (ref 8.9–10.3)
Chloride: 95 mmol/L — ABNORMAL LOW (ref 101–111)
Creatinine, Ser: 12.82 mg/dL — ABNORMAL HIGH (ref 0.61–1.24)
GFR calc Af Amer: 4 mL/min — ABNORMAL LOW (ref >60)
GFR, EST NON AFRICAN AMERICAN: 3 mL/min — AB (ref >60)
Glucose, Bld: 141 mg/dL — ABNORMAL HIGH (ref 65–99)
POTASSIUM: 5 mmol/L (ref 3.5–5.1)
SODIUM: 140 mmol/L (ref 135–145)

## 2015-08-09 LAB — I-STAT CHEM 8, ED
BUN: 87 mg/dL — ABNORMAL HIGH (ref 6–20)
Calcium, Ion: 1.16 mmol/L (ref 1.13–1.30)
Chloride: 98 mmol/L — ABNORMAL LOW (ref 101–111)
Creatinine, Ser: 12.6 mg/dL — ABNORMAL HIGH (ref 0.61–1.24)
Glucose, Bld: 139 mg/dL — ABNORMAL HIGH (ref 65–99)
HCT: 31 % — ABNORMAL LOW (ref 39.0–52.0)
Hemoglobin: 10.5 g/dL — ABNORMAL LOW (ref 13.0–17.0)
Potassium: 4.8 mmol/L (ref 3.5–5.1)
Sodium: 137 mmol/L (ref 135–145)
TCO2: 29 mmol/L (ref 0–100)

## 2015-08-09 LAB — CBC
HEMATOCRIT: 30 % — AB (ref 39.0–52.0)
Hemoglobin: 9.3 g/dL — ABNORMAL LOW (ref 13.0–17.0)
MCH: 27.4 pg (ref 26.0–34.0)
MCHC: 31 g/dL (ref 30.0–36.0)
MCV: 88.5 fL (ref 78.0–100.0)
PLATELETS: 302 10*3/uL (ref 150–400)
RBC: 3.39 MIL/uL — ABNORMAL LOW (ref 4.22–5.81)
RDW: 17.9 % — AB (ref 11.5–15.5)
WBC: 8.9 10*3/uL (ref 4.0–10.5)

## 2015-08-09 LAB — GLUCOSE, CAPILLARY
GLUCOSE-CAPILLARY: 126 mg/dL — AB (ref 65–99)
GLUCOSE-CAPILLARY: 105 mg/dL — AB (ref 65–99)
Glucose-Capillary: 110 mg/dL — ABNORMAL HIGH (ref 65–99)

## 2015-08-09 LAB — TSH: TSH: 1.91 u[IU]/mL (ref 0.350–4.500)

## 2015-08-09 LAB — BRAIN NATRIURETIC PEPTIDE: B Natriuretic Peptide: 54.9 pg/mL (ref 0.0–100.0)

## 2015-08-09 LAB — T4, FREE: FREE T4: 1.07 ng/dL (ref 0.61–1.12)

## 2015-08-09 LAB — I-STAT CG4 LACTIC ACID, ED: Lactic Acid, Venous: 1.01 mmol/L (ref 0.5–2.0)

## 2015-08-09 MED ORDER — SODIUM CHLORIDE 0.9 % IV SOLN: 100.0000 mL | INTRAVENOUS | Status: DC | PRN

## 2015-08-09 MED ORDER — INSULIN ASPART 100 UNIT/ML ~~LOC~~ SOLN
0.0000 [IU] | Freq: Three times a day (TID) | SUBCUTANEOUS | Status: DC
  Administered 2015-08-09 – 2015-08-10 (×2): 1 [IU] via SUBCUTANEOUS

## 2015-08-09 MED ORDER — DOXERCALCIFEROL 4 MCG/2ML IV SOLN
5.0000 ug | INTRAVENOUS | Status: DC
  Administered 2015-08-09: 5 ug via INTRAVENOUS
  Filled 2015-08-09: qty 4

## 2015-08-09 MED ORDER — PREGABALIN 75 MG PO CAPS: 100.0000 mg | ORAL_CAPSULE | Freq: Every day | ORAL | Status: DC | PRN

## 2015-08-09 MED ORDER — ONDANSETRON HCL 4 MG/2ML IJ SOLN: 4.0000 mg | Freq: Four times a day (QID) | INTRAMUSCULAR | Status: DC | PRN

## 2015-08-09 MED ORDER — DOCUSATE SODIUM 100 MG PO CAPS
100.0000 mg | ORAL_CAPSULE | Freq: Two times a day (BID) | ORAL | Status: DC
  Administered 2015-08-09 (×2): 100 mg via ORAL
  Filled 2015-08-09 (×3): qty 1

## 2015-08-09 MED ORDER — LANTHANUM CARBONATE 500 MG PO CHEW
1000.0000 mg | CHEWABLE_TABLET | Freq: Two times a day (BID) | ORAL | Status: DC
  Administered 2015-08-09 (×2): 1000 mg via ORAL
  Filled 2015-08-09 (×4): qty 2

## 2015-08-09 MED ORDER — GABAPENTIN 300 MG PO CAPS
300.0000 mg | ORAL_CAPSULE | Freq: Every day | ORAL | Status: DC
  Administered 2015-08-09: 300 mg via ORAL
  Filled 2015-08-09: qty 1

## 2015-08-09 MED ORDER — SODIUM CHLORIDE 0.9 % IJ SOLN
3.0000 mL | Freq: Two times a day (BID) | INTRAMUSCULAR | Status: DC
  Administered 2015-08-09 (×2): 3 mL via INTRAVENOUS

## 2015-08-09 MED ORDER — PANTOPRAZOLE SODIUM 40 MG PO TBEC
40.0000 mg | DELAYED_RELEASE_TABLET | Freq: Every day | ORAL | Status: DC
  Administered 2015-08-09: 40 mg via ORAL
  Filled 2015-08-09 (×2): qty 1

## 2015-08-09 MED ORDER — COLESTIPOL HCL 1 G PO TABS
1.0000 g | ORAL_TABLET | Freq: Two times a day (BID) | ORAL | Status: DC
  Administered 2015-08-09 (×2): 1 g via ORAL
  Filled 2015-08-09 (×5): qty 1

## 2015-08-09 MED ORDER — ONDANSETRON HCL 4 MG PO TABS: 4.0000 mg | ORAL_TABLET | Freq: Three times a day (TID) | ORAL | Status: DC | PRN

## 2015-08-09 MED ORDER — LIDOCAINE-PRILOCAINE 2.5-2.5 % EX CREA
1.0000 "application " | TOPICAL_CREAM | CUTANEOUS | Status: DC | PRN
  Filled 2015-08-09: qty 5

## 2015-08-09 MED ORDER — EZETIMIBE 10 MG PO TABS
10.0000 mg | ORAL_TABLET | Freq: Every day | ORAL | Status: DC
  Administered 2015-08-09: 10 mg via ORAL
  Filled 2015-08-09: qty 1

## 2015-08-09 MED ORDER — PENTAFLUOROPROP-TETRAFLUOROETH EX AERO: 1.0000 "application " | INHALATION_SPRAY | CUTANEOUS | Status: DC | PRN

## 2015-08-09 MED ORDER — DARBEPOETIN ALFA 100 MCG/0.5ML IJ SOSY
PREFILLED_SYRINGE | INTRAMUSCULAR | Status: AC
  Administered 2015-08-09: 100 ug via INTRAVENOUS
  Filled 2015-08-09: qty 0.5

## 2015-08-09 MED ORDER — CALCIUM CARBONATE-VITAMIN D 500-200 MG-UNIT PO TABS
3.0000 | ORAL_TABLET | Freq: Three times a day (TID) | ORAL | Status: DC
  Administered 2015-08-09 (×3): 3 via ORAL
  Filled 2015-08-09 (×5): qty 3

## 2015-08-09 MED ORDER — LIDOCAINE HCL (PF) 1 % IJ SOLN: 5.0000 mL | INTRAMUSCULAR | Status: DC | PRN

## 2015-08-09 MED ORDER — OXYCODONE HCL 5 MG PO TABS
2.5000 mg | ORAL_TABLET | Freq: Four times a day (QID) | ORAL | Status: DC | PRN
Start: 2015-08-09 — End: 2015-08-09

## 2015-08-09 MED ORDER — DOXERCALCIFEROL 4 MCG/2ML IV SOLN
INTRAVENOUS | Status: AC
  Administered 2015-08-09: 5 ug via INTRAVENOUS
  Filled 2015-08-09: qty 4

## 2015-08-09 MED ORDER — CARVEDILOL 12.5 MG PO TABS
12.5000 mg | ORAL_TABLET | Freq: Two times a day (BID) | ORAL | Status: DC
  Administered 2015-08-09: 12.5 mg via ORAL
  Filled 2015-08-09 (×3): qty 1

## 2015-08-09 MED ORDER — ENOXAPARIN SODIUM 100 MG/ML ~~LOC~~ SOLN
30.0000 mg | SUBCUTANEOUS | Status: DC
  Administered 2015-08-09: 30 mg via SUBCUTANEOUS
  Filled 2015-08-09 (×2): qty 1

## 2015-08-09 MED ORDER — HEPARIN SODIUM (PORCINE) 1000 UNIT/ML DIALYSIS: 1000.0000 [IU] | INTRAMUSCULAR | Status: DC | PRN

## 2015-08-09 MED ORDER — DARBEPOETIN ALFA 100 MCG/0.5ML IJ SOSY
100.0000 ug | PREFILLED_SYRINGE | INTRAMUSCULAR | Status: DC
  Administered 2015-08-09: 100 ug via INTRAVENOUS
  Filled 2015-08-09: qty 0.5

## 2015-08-09 MED ORDER — ACETAMINOPHEN 325 MG PO TABS
650.0000 mg | ORAL_TABLET | ORAL | Status: DC | PRN
  Administered 2015-08-10: 650 mg via ORAL
  Filled 2015-08-09: qty 2

## 2015-08-09 MED ORDER — ALLOPURINOL 100 MG PO TABS
100.0000 mg | ORAL_TABLET | Freq: Every day | ORAL | Status: DC
  Administered 2015-08-09: 100 mg via ORAL
  Filled 2015-08-09 (×2): qty 1

## 2015-08-09 MED ORDER — ALTEPLASE 2 MG IJ SOLR
2.0000 mg | Freq: Once | INTRAMUSCULAR | Status: DC | PRN
  Filled 2015-08-09: qty 2

## 2015-08-09 MED ORDER — HYDROCODONE-ACETAMINOPHEN 5-325 MG PO TABS
1.0000 | ORAL_TABLET | Freq: Four times a day (QID) | ORAL | Status: DC | PRN
  Administered 2015-08-10 (×2): 1 via ORAL
  Filled 2015-08-09 (×2): qty 1

## 2015-08-09 MED ORDER — RENA-VITE PO TABS
1.0000 | ORAL_TABLET | Freq: Every day | ORAL | Status: DC
  Administered 2015-08-09: 1 via ORAL
  Filled 2015-08-09: qty 1

## 2015-08-09 MED ORDER — AMLODIPINE BESYLATE 10 MG PO TABS
10.0000 mg | ORAL_TABLET | Freq: Every day | ORAL | Status: DC
  Administered 2015-08-09: 10 mg via ORAL
  Filled 2015-08-09 (×2): qty 1

## 2015-08-09 MED ORDER — CINACALCET HCL 30 MG PO TABS
60.0000 mg | ORAL_TABLET | Freq: Two times a day (BID) | ORAL | Status: DC
  Administered 2015-08-09 (×2): 60 mg via ORAL
  Filled 2015-08-09 (×4): qty 2

## 2015-08-09 NOTE — Consult Note (Signed)
Reason for Consult: Continuity of ESRD care Referring Physician: Bonnielee Haff M.D. [facility])  HPI: 64 year old African-American man with past medical history significant for type 2 diabetes mellitus, hypertension, diastolic heart failure and end-stage renal disease on hemodialysis (TTS). Recently had right total knee arthroplasty (08/02/15) following which was discharged to [facility] skilled nursing facility and rehabilitation and over the past few days noted to be increasingly lethargic and with poor ability to participate in rehabilitation exercises. Yesterday started complaining of chest pain following which he was transferred to the emergency room for evaluation. No evidence of acute coronary syndrome from enzymes or EKG. Changes in mental status attributed to narcotic/medication effects (on oxycodone/Lyrica).  Dialysis prescription: Tuesday/Thursday/Saturday at [facility] 4 hours, 180 dialyzer, blood flow rate 450, dialysate flow 800, EDW 107.5 kg, 2K/2.25 calcium, UF profile 4, linear sodium modeling, transposed RUA BBF. [name] 5 g 3 times a week, Mircera 75 g every 2 weeks  Past Medical History  Diagnosis Date  . Hyperthyroidism   . GASTROENTERITIS, VIRAL 10/14/2009  . ONYCHOMYCOSIS, TOENAILS 12/26/2007  . GOITER, MULTINODULAR 12/26/2007  . HYPERTHYROIDISM 02/02/2010  . DIABETES MELLITUS, TYPE II 02/01/2010  . DYSLIPIDEMIA 06/18/2007  . GOUT 06/18/2007  . Hypocalcemia 06/07/2010  . DEPRESSION 10/14/2009  . PERIPHERAL NEUROPATHY 06/18/2007  . HYPERTENSION 06/18/2007  . CONGESTIVE HEART FAILURE 06/18/2007  . DIASTOLIC HEART FAILURE, CHRONIC 02/06/2009  . Unspecified hypotension 01/30/2010  . PULMONARY NODULE, RIGHT LOWER LOBE 06/08/2009  . GERD 06/18/2007  . BENIGN PROSTATIC HYPERTROPHY 10/14/2009  . GYNECOMASTIA 07/17/2010  . NECK PAIN 07/31/2010  . FOOT PAIN 08/12/2008  . DIZZINESS 07/17/2010  . Other malaise and fatigue 11/24/2009  . GAIT DISTURBANCE 03/03/2010  . DYSPNEA  10/29/2008  . CHEST PAIN 03/29/2010  . TRANSAMINASES, SERUM, ELEVATED 02/01/2010  . COLONIC POLYPS, HX OF 10/14/2009  . Anemia 06/16/2011  . Ischemic cardiomyopathy 06/16/2011  . Hyperparathyroidism, secondary ([place]) 06/16/2011  . OSA on CPAP 10/16/2011  . Hyperlipidemia 10/16/2011  . CAD, NATIVE VESSEL 02/06/2009    saw Dr. Missy Sabins last jan 2013  . Sleep apnea     cpap machine and o2  . Allergic rhinitis, cause unspecified 02/24/2014  . Transfusion history     none recent  . Hemodialysis access, fistula mature [facility])     Dialysis T-Th-Sa ([place]) Right upper arm fistula  . ESRD (end stage renal disease) on dialysis ([place]) 08/04/2010    "TTS;  " (04/18/2015)  . Dementia [id number]    Past Surgical History  Procedure Laterality Date  . Coronary angioplasty with stent placement  06/11/2008  . Cervical spine surgery  2/09    "to repair nerve problems in my left arm"  . Back surgery  1998  . Esophagogastroduodenoscopy  09/28/2011    Procedure: ESOPHAGOGASTRODUODENOSCOPY (EGD);  Surgeon: Missy Sabins, MD;  Location: [facility] ENDOSCOPY;  Service: Endoscopy;  Laterality: N/A;  . Givens capsule study  09/30/2011    Procedure: GIVENS CAPSULE STUDY;  Surgeon: Jeryl Columbia, MD;  Location: [facility] ENDOSCOPY;  Service: Endoscopy;  Laterality: N/A;  . Cholecystectomy    . Coronary angioplasty with stent placement  06/2007    TAXUS stent to RCA/notes 01/31/2011  . Tonsillectomy    . Av fistula placement  11/07/2011    Procedure: INSERTION OF ARTERIOVENOUS (AV) GORE-TEX GRAFT ARM;  Surgeon: Tinnie Gens, MD;  Location: [place];  Service: Vascular;  Laterality: Left;  . Arteriovenous graft placement Right 2009    forearm/notes 02/01/2011  . Insertion  of dialysis catheter Right 2014  . Insertion of dialysis catheter Left 02/11/2013    Procedure: INSERTION OF DIALYSIS CATHETER;  Surgeon: Conrad [place], MD;  Location: [place];  Service: Vascular;  Laterality: Left;  Ultrasound guided  . Removal of a dialysis  catheter Right 02/11/2013    Procedure: REMOVAL OF A DIALYSIS CATHETER;  Surgeon: Conrad Sharon, MD;  Location: [place];  Service: Vascular;  Laterality: Right;  . Bascilic vein transposition Right 02/27/2013    Procedure: BASCILIC VEIN TRANSPOSITION;  Surgeon: Mal Misty, MD;  Location: [place];  Service: Vascular;  Laterality: Right;  Right Basilic Vein Transposition   . Shuntogram N/A 09/20/2011    Procedure: Earney Mallet;  Surgeon: Conrad Dunbar, MD;  Location: [facility] CATH LAB;  Service: Cardiovascular;  Laterality: N/A;  . Venogram N/A 01/26/2013    Procedure: VENOGRAM;  Surgeon: Angelia Mould, MD;  Location: [facility] CATH LAB;  Service: Cardiovascular;  Laterality: N/A;  . Esophagogastroduodenoscopy N/A 04/07/2015    Procedure: ESOPHAGOGASTRODUODENOSCOPY (EGD);  Surgeon: Teena Irani, MD;  Location: [facility] ENDOSCOPY;  Service: Endoscopy;  Laterality: N/A;  . Savory dilation N/A 04/07/2015    Procedure: SAVORY DILATION;  Surgeon: Teena Irani, MD;  Location: [facility] ENDOSCOPY;  Service: Endoscopy;  Laterality: N/A;  . Foreign body removal  09/2003    via upper endoscopy/notes 02/12/2011  . Esophagogastroduodenoscopy N/A 04/19/2015    Procedure: ESOPHAGOGASTRODUODENOSCOPY (EGD);  Surgeon: Arta Silence, MD;  Location: [facility] ENDOSCOPY;  Service: Endoscopy;  Laterality: N/A;  . Total knee arthroplasty Right 08/02/2015    Procedure: TOTAL KNEE ARTHROPLASTY;  Surgeon: Renette Butters, MD;  Location: [place];  Service: Orthopedics;  Laterality: Right;    Family History  Problem Relation Age of Onset  . Heart disease Father   . Heart disease Sister     Social History:  reports that he has quit smoking. His smoking use included Cigarettes. He has a 25 pack-year smoking history. He quit smokeless tobacco use about 9 years ago. He reports that he drinks alcohol. He reports that he does not use illicit drugs.  Allergies:  Allergies  Allergen Reactions  . Cephalexin Swelling and Other (See Comments)    Tongue swelling  .  Statins     Weak muscles  . Ciprofloxacin Rash    Medications:  Scheduled: . allopurinol  100 mg Oral Daily  . amLODipine  10 mg Oral Daily  . calcium-vitamin D  3 tablet Oral TID WC  . carvedilol  12.5 mg Oral BID WC  . cinacalcet  60 mg Oral BID WC  . colestipol  1 g Oral BID  . docusate sodium  100 mg Oral BID  . enoxaparin  30 mg Subcutaneous Q24H  . ezetimibe  10 mg Oral QHS  . gabapentin  300 mg Oral QHS  . insulin aspart  0-9 Units Subcutaneous TID WC  . lanthanum  1,000 mg Oral BID WC  . multivitamin  1 tablet Oral QHS  . pantoprazole  40 mg Oral Daily  . sodium chloride  3 mL Intravenous Q12H    BMP Latest Ref Rng 08/09/2015 08/09/2015 08/04/2015  Glucose 65 - 99 mg/dL 139(H) 141(H) 97  BUN 6 - 20 mg/dL 87(H) 86(H) 63(H)  Creatinine 0.61 - 1.24 mg/dL 12.60(H) 12.82(H) 11.71(H)  Sodium 135 - 145 mmol/L 137 140 139  Potassium 3.5 - 5.1 mmol/L 4.8 5.0 5.1  Chloride 101 - 111 mmol/L 98(L) 95(L) 98(L)  CO2 22 - 32 mmol/L - 26 26  Calcium 8.9 - 10.3 mg/dL - 10.1 9.5   CBC Latest Ref Rng 08/09/2015 08/09/2015 08/04/2015  WBC 4.0 - 10.5 K/uL - 8.9 10.6(H)  Hemoglobin 13.0 - 17.0 g/dL 10.5(L) 9.3(L) 8.9(L)  Hematocrit 39.0 - 52.0 % 31.0(L) 30.0(L) 28.2(L)  Platelets 150 - 400 K/uL - 302 225     Dg Chest 2 View  08/08/2015  CLINICAL DATA:  Sudden onset of substernal chest pain tonight and shortness of breath. Recent knee surgery. EXAM: CHEST  2 VIEW COMPARISON:  06/26/2013 FINDINGS: The heart is borderline enlarged but stable. The mediastinal and hilar contours are within normal limits and unchanged. Mild tortuosity of the thoracic aorta. The lungs demonstrate streaky scarring changes but no infiltrates, edema or effusions. The bony thorax is intact. IMPRESSION: Chronic basilar scarring changes but no acute pulmonary findings. Electronically Signed   By: Marijo Sanes M.D.   On: 08/08/2015 21:11    Review of Systems  Constitutional: Negative.   HENT: Negative.   Eyes:  Negative.   Respiratory: Negative.   Cardiovascular: Positive for chest pain. Negative for palpitations, orthopnea and claudication.       Chest pain prior to admission-none at this time  Gastrointestinal: Negative.   Genitourinary: Negative.   Musculoskeletal: Positive for back pain and joint pain.       Pain at recent site of TKR  Skin: Negative.    Blood pressure 82/51, pulse 70, temperature 98 F (36.7 C), temperature source Oral, resp. rate 16, height 6' (1.829 m), weight 106.822 kg (235 lb 8 oz), SpO2 97 %. Physical Exam  Nursing note and vitals reviewed. Constitutional: He is oriented to person, place, and time. He appears well-developed and well-nourished.  HENT:  Head: Normocephalic and atraumatic.  Mouth/Throat: Oropharynx is clear and moist.  Eyes: Conjunctivae and EOM are normal. Pupils are equal, round, and reactive to light.  Neck: Normal range of motion. Neck supple. No JVD present.  Cardiovascular: Normal rate, regular rhythm and normal heart sounds.   Respiratory: Effort normal and breath sounds normal. He has no wheezes.  GI: Soft. Bowel sounds are normal. He exhibits distension. There is no tenderness. There is no rebound and no guarding.  Musculoskeletal: He exhibits no edema.  Right upper arm transposed brachial basilic fistula  Neurological: He is oriented to person, place, and time.  Sleepy but awoken easily  Skin: Skin is warm and dry. No erythema.    Assessment/Plan: 1. Altered mental status: Suspected to be medication induced from recent escalation of narcotic therapy with ongoing Lyrica-monitor postdialysis 2. Chest pain: No clear evidence of acute coronary syndrome from enzymes/EKG-awaiting CT angiogram to evaluate for possible PE 3. End-stage renal disease: Usual hemodialysis schedule is Tuesday/Thursday and Saturday-we'll order for hemodialysis today. 4. Anemia: Hemoglobin levels comparatively better compared to last week-continue ESA. He does not have  any overt losses. 5. Metabolic bone disease: Continue calcium carbonate and Sensipar 6. Hypertension: Monitor closely for need to decrease antihypertensive doses given borderline hypotension.  Evy Lutterman K. 08/09/2015, 10:42 AM

## 2015-08-09 NOTE — ED Notes (Signed)
Smith MD at bedside ?

## 2015-08-09 NOTE — Progress Notes (Signed)
PATIENT ARRIVED TO UNIT 2W FROM E.D. VIA STRETCHER. MOVED FROM STRETCHER TO BED. TELE APPLIED. VITALS OBTAINED. ASSESSMENT COMPLETED.  PATENT INSTRUCTED TO CALL FOR ASSISTANCE WHEN NEEDED.

## 2015-08-09 NOTE — Procedures (Signed)
Patient seen on Hemodialysis. QB 400, UF goal 3.5L Treatment adjusted as needed.  Elmarie Shiley MD [facility]. Office # [phone] Pager # [phone] 2:33 PM

## 2015-08-09 NOTE — H&P (Signed)
[facility] History and Physical  [name] [id number] DOB: [birth year] DOA: 08/08/2015  Referring physician: ED PCP: [name], MD   Chief Complaint: Chest pain and AMS  HPI:  Patient is a 64 year old male with a past medical history significant for ESRD on HD T/TH/Sat, gout, diabetes mellitus type 2, HTN, and HLD; who recently had had undergone a total knee replacement on 08/02/2015. Following the surgical procedure he had gone to [place] rehabilitation facility and in the last day or so had become more altered. History is obtained through the patient's wife was present at bedside as he is lethargic, but arousable to verbal commands. Apparently patient had been at rehabilitation receiving physical therapy at noon. He had acute onset of complaints of chest pain on the left-hand side with nausea, vomiting, and diaphoresis. Initial workup on arrival included EKG which was seen to be within normal limits and negative troponins. Last echocardiogram was in March 2016 with a normal ejection fraction of 50-55%. Wife states that in the days prior to coming in you. More lethargic and had decreased appetite. She also states he was more confused than baseline and would have this gloss over. to his eyes that had never been there previously.      ROS       Past Medical History  Diagnosis Date  . Hyperthyroidism   . GASTROENTERITIS, VIRAL 10/14/2009  . ONYCHOMYCOSIS, TOENAILS 12/26/2007  . GOITER, MULTINODULAR 12/26/2007  . HYPERTHYROIDISM 02/02/2010  . DIABETES MELLITUS, TYPE II 02/01/2010  . DYSLIPIDEMIA 06/18/2007  . GOUT 06/18/2007  . Hypocalcemia 06/07/2010  . DEPRESSION 10/14/2009  . PERIPHERAL NEUROPATHY 06/18/2007  . HYPERTENSION 06/18/2007  . CONGESTIVE HEART FAILURE 06/18/2007  . DIASTOLIC HEART FAILURE, CHRONIC 02/06/2009  . Unspecified hypotension 01/30/2010  . PULMONARY NODULE, RIGHT LOWER LOBE 06/08/2009  . GERD 06/18/2007  . BENIGN PROSTATIC HYPERTROPHY 10/14/2009  .  GYNECOMASTIA 07/17/2010  . NECK PAIN 07/31/2010  . FOOT PAIN 08/12/2008  . DIZZINESS 07/17/2010  . Other malaise and fatigue 11/24/2009  . GAIT DISTURBANCE 03/03/2010  . DYSPNEA 10/29/2008  . CHEST PAIN 03/29/2010  . TRANSAMINASES, SERUM, ELEVATED 02/01/2010  . COLONIC POLYPS, HX OF 10/14/2009  . Anemia 06/16/2011  . Ischemic cardiomyopathy 06/16/2011  . Hyperparathyroidism, secondary ([place]) 06/16/2011  . OSA on CPAP 10/16/2011  . Hyperlipidemia 10/16/2011  . CAD, NATIVE VESSEL 02/06/2009    saw Dr. Missy Sabins last jan 2013  . Sleep apnea     cpap machine and o2  . Allergic rhinitis, cause unspecified 02/24/2014  . Transfusion history     none recent  . Hemodialysis access, fistula mature [facility])     Dialysis T-Th-Sa ([place]) Right upper arm fistula  . ESRD (end stage renal disease) on dialysis ([place]) 08/04/2010    "TTS;  " (04/18/2015)  . Dementia [id number]     Past Surgical History  Procedure Laterality Date  . Coronary angioplasty with stent placement  06/11/2008  . Cervical spine surgery  2/09    "to repair nerve problems in my left arm"  . Back surgery  1998  . Esophagogastroduodenoscopy  09/28/2011    Procedure: ESOPHAGOGASTRODUODENOSCOPY (EGD);  Surgeon: Missy Sabins, MD;  Location[facility] END[facility]COPY;  Service: Endoscopy;  Laterality: N/A;  . Givens capsule study  09/30/2011    Procedure: GIVENS CAPSULE STUDY;  Surgeon: Jeryl Columbia, MD;  Location: [facility] ENDOSCOPY;  Service: Endoscopy;  Laterality: N/A;  . Cholecystectomy    . Coronary angioplasty with stent placement  06/2007  TAXUS stent to RCA/notes 01/31/2011  . Tonsillectomy    . Av fistula placement  11/07/2011    Procedure: INSERTION OF ARTERIOVENOUS (AV) GORE-TEX GRAFT ARM;  Surgeon: Tinnie Gens, MD;  Location: [place];  Service: Vascular;  Laterality: Left;  . Arteriovenous graft placement Right 2009    forearm/notes 02/01/2011  . Insertion of dialysis catheter Right 2014  . Insertion of dialysis catheter Left  02/11/2013    Procedure: INSERTION OF DIALYSIS CATHETER;  Surgeon: Conrad Colman, MD;  Location: [place];  Service: Vascular;  Laterality: Left;  Ultrasound guided  . Removal of a dialysis catheter Right 02/11/2013    Procedure: REMOVAL OF A DIALYSIS CATHETER;  Surgeon: Conrad Lares, MD;  Location: [place];  Service: Vascular;  Laterality: Right;  . Bascilic vein transposition Right 02/27/2013    Procedure: BASCILIC VEIN TRANSPOSITION;  Surgeon: Mal Misty, MD;  Location: [place];  Service: Vascular;  Laterality: Right;  Right Basilic Vein Transposition   . Shuntogram N/A 09/20/2011    Procedure: Earney Mallet;  Surgeon: Conrad , MD;  Location: [facility] CATH LAB;  Service: Cardiovascular;  Laterality: N/A;  . Venogram N/A 01/26/2013    Procedure: VENOGRAM;  Surgeon: Angelia Mould, MD;  Location: [facility] CATH LAB;  Service: Cardiovascular;  Laterality: N/A;  . Esophagogastroduodenoscopy N/A 04/07/2015    Procedure: ESOPHAGOGASTRODUODENOSCOPY (EGD);  Surgeon: Teena Irani, MD;  Location: [facility] ENDOSCOPY;  Service: Endoscopy;  Laterality: N/A;  . Savory dilation N/A 04/07/2015    Procedure: SAVORY DILATION;  Surgeon: Teena Irani, MD;  Location: [facility] ENDOSCOPY;  Service: Endoscopy;  Laterality: N/A;  . Foreign body removal  09/2003    via upper endoscopy/notes 02/12/2011  . Esophagogastroduodenoscopy N/A 04/19/2015    Procedure: ESOPHAGOGASTRODUODENOSCOPY (EGD);  Surgeon: Arta Silence, MD;  [name] END[facility]COPY;  Service: Endoscopy;  Laterality: N/A;  . Total knee arthroplasty Right 08/02/2015    Procedure: TOTAL KNEE ARTHROPLASTY;  Surgeon: Renette Butters, MD;  Location: [place];  Service: Orthopedics;  Laterality: Right;      Social History:  reports that he has quit smoking. His smoking use included Cigarettes. He has a 25 pack-year smoking history. He quit smokeless tobacco use about 9 years ago. He reports that he drinks alcohol. He reports that he does not use illicit drugs. where does patient live? Rehab   Can patient participate in ADLs? Needs some assistance  Allergies  Allergen Reactions  . Cephalexin Swelling and Other (See Comments)    Tongue swelling  . Statins     Weak muscles  . Ciprofloxacin Rash    Family History  Problem Relation Age of Onset  . Heart disease Father   . Heart disease Sister         Prior to Admission medications   Medication Sig Start Date End Date Taking? Authorizing Provider  allopurinol (ZYLOPRIM) 100 MG tablet Take 1 tablet (100 mg total) by mouth daily. 06/08/15  Yes Biagio Borg, MD  amLODipine (NORVASC) 10 MG tablet Take 10 mg by mouth daily. 04/13/15  Yes Historical Provider, MD  calcium-vitamin D (OSCAL WITH D) 500-200 MG-UNIT per tablet Take 3 tablets by mouth 3 (three) times daily with meals.    Yes Historical Provider, MD  carvedilol (COREG) 12.5 MG tablet Take 12.5 mg by mouth 2 (two) times daily.    Yes Historical Provider, MD  cinacalcet (SENSIPAR) 60 MG tablet Take 60 mg by mouth 2 (two) times daily.    Yes Historical Provider, MD  colestipol (COLESTID) 1 G tablet Take 1 g by mouth 2 (two) times daily.   Yes Historical Provider, MD  DEXILANT 60 MG capsule Take 1 tablet by mouth daily as needed (acid reflux).  04/22/15  Yes Historical Provider, MD  docusate sodium (COLACE) 100 MG capsule Take 1 capsule (100 mg total) by mouth 2 (two) times daily. 08/02/15  Yes Brittney Kelly, PA-C  enoxaparin (LOVENOX) 100 MG/ML injection Inject 0.3 mLs (30 mg total) into the skin daily. 08/02/15  Yes Brittney Claiborne Billings, PA-C  ezetimibe (ZETIA) 10 MG tablet Take 1 tablet (10 mg total) by mouth daily. Patient taking differently: Take 10 mg by mouth at bedtime.  12/29/14  Yes Jolaine Artist, MD  gabapentin (NEURONTIN) 300 MG capsule Take 1 capsule (300 mg total) by mouth at bedtime. 06/08/15  Yes Biagio Borg, MD  lanthanum (FOSRENOL) 1000 MG chewable tablet Chew 1,000 mg by mouth 2 times daily at 12 noon and 4 pm.   Yes Historical Provider, MD  multivitamin  (RENA-VIT) TABS tablet Take 1 tablet by mouth daily.     Yes Historical Provider, MD  nateglinide (STARLIX) 60 MG tablet Take 1 tablet (60 mg total) by mouth 3 (three) times daily with meals. 03/14/15  Yes Renato Shin, MD  ondansetron (ZOFRAN) 4 MG tablet Take 1 tablet (4 mg total) by mouth every 8 (eight) hours as needed for nausea or vomiting. 08/02/15  Yes Brittney Kelly, PA-C  oxyCODONE (OXY IR/ROXICODONE) 5 MG immediate release tablet Take 5 mg by mouth every 4 (four) hours as needed for moderate pain or severe pain.   Yes Historical Provider, MD  pregabalin (LYRICA) 100 MG capsule Take 100 mg by mouth daily as needed (nerve pain).   Yes Historical Provider, MD  carvedilol (COREG) 25 MG tablet Take 0.5 tablets (12.5 mg total) by mouth 2 (two) times daily with a meal. Only on non-HD days Patient not taking: Reported on 08/08/2015 03/10/14   Jolaine Artist, MD  colesevelam [facility]) 625 MG tablet Take 3 tablets (1,875 mg total) by mouth 2 (two) times daily with a meal. Patient not taking: Reported on 08/08/2015 06/08/15   Biagio Borg, MD  oxyCODONE-acetaminophen (PERCOCET) 5-325 MG tablet Take 1-2 tablets by mouth every 4 (four) hours as needed for severe pain. Patient not taking: Reported on 08/08/2015 08/02/15   Lovett Calender, PA-C     Physical Exam: Filed Vitals:   08/09/15 0245 08/09/15 0300 08/09/15 0408 08/09/15 0410  BP: 124/77 108/69 133/40   Pulse: 74 65 70   Temp:   98 F (36.7 C)   TempSrc:   Oral   Resp: 17 15 16    Height:    6' (1.829 m)  Weight:    106.822 kg (235 lb 8 oz)  SpO2: 86% 97% 97%      Constitutional: Vital signs reviewed. Patient is a well-developed and well-nourished in no acute distress and cooperative with exam. Lethargic, but arousable with verbal commands Head: Normocephalic and atraumatic  Ear: TM normal bilaterally  Mouth: no erythema or exudates, MMM  Eyes: PERRL, EOMI, conjunctivae normal, No scleral icterus.  Neck: Supple, Trachea midline normal  ROM, No JVD, mass, thyromegaly, or carotid bruit present.  Cardiovascular: RRR, S1 normal, S2 normal, no MRG, pulses symmetric and intact bilaterally  Pulmonary/Chest: CTAB, no wheezes, rales, or rhonchi  Abdominal: Soft. Non-tender, non-distended, bowel sounds are normal, no masses, organomegaly, or guarding present.  GU: no CVA tenderness Musculoskeletal: No joint deformities, erythema, or stiffness,  ROM full and no nontender Ext: Right knee  warm with mild swelling. no cyanosis, pulses palpable bilaterally (DP and PT)  Hematology: no cervical, inginal, or axillary adenopathy.  Neurological: A&O x3, Strenght is normal and symmetric bilaterally, cranial nerve II-XII are grossly intact, no focal motor deficit, sensory intact to light touch bilaterally.  Skin: Warm, dry and intact. No rash, cyanosis, or clubbing.  Psychiatric: Lethargic. oriented only to person     Data Review   Micro Results No results found for this or any previous visit (from the past 240 hour(s)).  Radiology Reports Dg Chest 2 View  08/08/2015  CLINICAL DATA:  Sudden onset of substernal chest pain tonight and shortness of breath. Recent knee surgery. EXAM: CHEST  2 VIEW COMPARISON:  06/26/2013 FINDINGS: The heart is borderline enlarged but stable. The mediastinal and hilar contours are within normal limits and unchanged. Mild tortuosity of the thoracic aorta. The lungs demonstrate streaky scarring changes but no infiltrates, edema or effusions. The bony thorax is intact. IMPRESSION: Chronic basilar scarring changes but no acute pulmonary findings. Electronically Signed   By: Marijo Sanes M.D.   On: 08/08/2015 21:11   Dg Pelvis 1-2 Views  07/13/2015  CLINICAL DATA:  Pelvic pain EXAM: PELVIS - 1-2 VIEW COMPARISON:  None. FINDINGS: Single frontal view of the pelvis submitted. Postsurgical changes are noted lower lumbar spine. Study is limited by diffuse osteopenia and patient's large body habitus. No acute fracture or  subluxation. Mild degenerative changes bilateral hip joints with mild narrowing superior hip joint space. Mild degenerative changes pubic symphysis. IMPRESSION: No acute fracture or subluxation.  Mild degenerative changes. Electronically Signed   By: Lahoma Crocker M.D.   On: 07/13/2015 14:47   Dg Knee Right Port  08/02/2015  CLINICAL DATA:  Right total knee arthroplasty. EXAM: PORTABLE RIGHT KNEE - 1-2 VIEW COMPARISON:  01/04/2015 FINDINGS: Right total knee arthroplasty identified. There is no evidence of subluxation or dislocation. Soft tissue postoperative changes are present. No complicating features are noted. IMPRESSION: Right total knee arthroplasty and postoperative changes. No definite complicating features. Electronically Signed   By: Margarette Canada M.D.   On: 08/02/2015 14:35     CBC  Recent Labs Lab 08/02/15 0833 08/02/15 1909 08/04/15 0745 08/09/15 0045 08/09/15 0116  WBC  --  8.7 10.6* 8.9  --   HGB 12.9* 10.6* 8.9* 9.3* 10.5*  HCT 38.0* 35.2* 28.2* 30.0* 31.0*  PLT  --  226 225 302  --   MCV  --  92.9 89.8 88.5  --   MCH  --  28.0 28.3 27.4  --   MCHC  --  30.1 31.6 31.0  --   RDW  --  18.3* 18.5* 17.9*  --     Chemistries   Recent Labs Lab 08/02/15 0833 08/02/15 1909 08/04/15 0745 08/09/15 0045 08/09/15 0116  NA 139  --  139 140 137  K 3.9  --  5.1 5.0 4.8  CL  --   --  98* 95* 98*  CO2  --   --  26 26  --   GLUCOSE 117*  --  97 141* 139*  BUN  --   --  63* 86* 87*  CREATININE  --  7.70* 11.71* 12.82* 12.60*  CALCIUM  --   --  9.5 10.1  --    ------------------------------------------------------------------------------------------------------------------ estimated creatinine clearance is 7.3 mL/min (by C-G formula based on Cr of 12.6). ------------------------------------------------------------------------------------------------------------------ No results for input(s): HGBA1C in the last 72  hours. ------------------------------------------------------------------------------------------------------------------ No results for input(s): CHOL, HDL, LDLCALC, TRIG, CHOLHDL, LDLDIRECT in the last 72 hours. ------------------------------------------------------------------------------------------------------------------ No results for input(s): TSH, T4TOTAL, T3FREE, THYROIDAB in the last 72 hours.  Invalid input(s): FREET3 ------------------------------------------------------------------------------------------------------------------ No results for input(s): VITAMINB12, FOLATE, FERRITIN, TIBC, IRON, RETICCTPCT in the last 72 hours.  Coagulation profile No results for input(s): INR, PROTIME in the last 168 hours.  No results for input(s): DDIMER in the last 72 hours.  Cardiac Enzymes No results for input(s): CKMB, TROPONINI, MYOGLOBIN in the last 168 hours.  Invalid input(s): CK ------------------------------------------------------------------------------------------------------------------ Invalid input(s): POCBNP   CBG:  Recent Labs Lab 08/02/15 0811 08/02/15 1307 08/04/15 0646 08/05/15 1208  GLUCAP 114* 94 95 80       EKG: Independently reviewed. Normal sinus rhythm   Assessment/Plan Principal Problem:  Acute encephalopathy: Patient acutely altered beyond baseline.  Question the possibility medication overdose versus acute infection. - Decreased narcotic medications  Chest pain. Acute issue for which patient had a normal EKG and troponins. Question the possibility of a PE given patient's recent surgical procedure. -trend cardiac enzymes 3 -Rechecking EKG this morning -CT with PE protocol ordered  ESRD on dialysis [facility]) : Patient normal schedule is T/Th/Sat. Creatinine currently 12.6. - Consult nephrology in a.m. for hemodialysis  Anemia: Stable. Hemoglobin 10.5 and hematocrit 31 Recheck CBC  GOUT stable. -Continue allopurinol  CAD (coronary  artery disease)stable see above      Hyperthyroidism -Check free T4 and TSH  S/P right total knee replacement -Physical therapy to eval and treat -We'll need to obtain records from  [facility] on CPAP: Patient with desats on the monitor while not using CPAP machine. -CPAP at night   Code Status:   full Family Communication: bedside Disposition Plan: admit   Total time spent 55 minutes.Greater than 50% of this time was spent in counseling, explanation of diagnosis, planning of further management, and coordination of care  [facility] Pager [phone]  If 7PM-7AM, please contact night-coverage [email or web address] Password [facility] 08/09/2015, 5:34 AM

## 2015-08-09 NOTE — ED Notes (Signed)
RN attempt to call report to floor; RN to call back

## 2015-08-09 NOTE — NC FL2 (Addendum)
[place] LEVEL OF CARE SCREENING TOOL     IDENTIFICATION  Patient Name: [name] Birthdate: 1950/10/05 Sex: male Admission Date (Current Location): 08/08/2015  [facility] and [place] Number: Herbalist and Address:  [facility] [place] [facility] [place] [address], [place], [place] [address]      Provider Number: [id number]  Attending Physician Name and Address:  Bonnielee Haff, MD  Relative Name and Phone Number:  Finch Pfuhl [phone]    Current Level of Care: Hospital Recommended Level of Care: [place] Prior Approval Number:    Date Approved/Denied:   PASRR Number:    Discharge Plan: SNF    Current Diagnoses: Patient Active Problem List   Diagnosis Date Noted  . Chest pain 08/09/2015  . Acute encephalopathy 08/09/2015  . Encephalopathy acute 08/09/2015  . S/P right total knee replacement 08/09/2015  . Primary osteoarthritis of right knee 08/05/2015  . Neuropathy due to secondary diabetes mellitus ([place]) 08/05/2015  . Arthritis of knee 08/02/2015  . Headache 06/08/2015  . Melena 04/19/2015  . Acute blood loss anemia 04/18/2015  . Dialysis patient ([place]) 04/18/2015  . Hyperthyroidism 03/11/2015  . Thigh pain 12/08/2014  . PSA elevation 08/30/2014  . Allergic rhinitis, cause unspecified 02/24/2014  . Elevated PSA 02/24/2014  . Vertigo 10/14/2013  . Numbness 05/15/2013  . ESRD on dialysis ([place]) 04/06/2013  . Chronic systolic heart failure ([place]) 02/09/2013  . Other complications due to renal dialysis device, implant, and graft 01/14/2013  . Right hand pain 03/28/2012  . Dysphagia 03/28/2012  . Hypogonadism, male 03/28/2012  . OSA (obstructive sleep apnea) 10/16/2011  . Hyperlipidemia 10/16/2011  . CAD (coronary artery disease) 09/28/2011  . NSTEMI (non-ST elevated myocardial infarction) ([place]) 09/28/2011  . AVM (arteriovenous malformation) 09/27/2011  . Anemia associated with acute blood loss 09/26/2011  .  Anemia 06/16/2011  . Ischemic cardiomyopathy 06/16/2011  . Hyperparathyroidism, secondary ([place]) 06/16/2011  . Preventative health care 03/11/2011  . NECK PAIN 07/31/2010  . GYNECOMASTIA 07/17/2010  . DIZZINESS 07/17/2010  . GAIT DISTURBANCE 03/03/2010  . Thyrotoxicosis 02/02/2010  . Diabetes ([place]) 02/01/2010  . TRANSAMINASES, SERUM, ELEVATED 02/01/2010  . Other malaise and fatigue 11/24/2009  . DEPRESSION 10/14/2009  . BENIGN PROSTATIC HYPERTROPHY 10/14/2009  . COLONIC POLYPS, HX OF 10/14/2009  . Dementia 09/02/2009  . PULMONARY NODULE, RIGHT LOWER LOBE 06/08/2009  . DIASTOLIC HEART FAILURE, CHRONIC 02/06/2009  . DYSPNEA 10/29/2008  . FOOT PAIN 08/12/2008  . ONYCHOMYCOSIS, TOENAILS 12/26/2007  . GOITER, MULTINODULAR 12/26/2007  . GOUT 06/18/2007  . PERIPHERAL NEUROPATHY 06/18/2007  . Hypertensive heart disease with CHF (congestive heart failure) ([place]) 06/18/2007  . GERD 06/18/2007    Orientation ACTIVITIES/SOCIAL BLADDER RESPIRATION    Self, Time, Situation, Place  Active Continent Normal  BEHAVIORAL SYMPTOMS/MOOD NEUROLOGICAL BOWEL NUTRITION STATUS      Continent Diet (Carb Modified)  PHYSICIAN VISITS COMMUNICATION OF NEEDS Height & Weight Skin    Verbally 6' (182.9 cm) 235 lbs. Surgical wounds          AMBULATORY STATUS RESPIRATION    Supervision limited Normal      Personal Care Assistance Level of Assistance   (Limited Assistance) Bathing Assistance: Limited assistance   Dressing Assistance: Limited assistance      Functional Limitations Info                SPECIAL CARE FACTORS FREQUENCY  PT (By licensed PT), OT (By licensed OT)     PT Frequency: 5x/week OT  Frequency: 5x/week           Additional Factors Info  Code Status, Allergies Code Status Info: FULL CODE Allergies Info: Cephalexin, Statins, Ciprofloxacin           Current Medications (08/09/2015): Current Facility-Administered Medications  Medication Dose Route Frequency  Provider Last Rate Last Dose  . acetaminophen (TYLENOL) tablet 650 mg  650 mg Oral Q4H PRN Norval Morton, MD      . allopurinol (ZYLOPRIM) tablet 100 mg  100 mg Oral Daily Norval Morton, MD   100 mg at 08/09/15 1033  . amLODipine (NORVASC) tablet 10 mg  10 mg Oral Daily Norval Morton, MD   10 mg at 08/09/15 1033  . calcium-vitamin D (OSCAL WITH D) 500-200 MG-UNIT per tablet 3 tablet  3 tablet Oral TID WC Norval Morton, MD   3 tablet at 08/09/15 1353  . carvedilol (COREG) tablet 12.5 mg  12.5 mg Oral BID WC Norval Morton, MD   12.5 mg at 08/09/15 0657  . cinacalcet (SENSIPAR) tablet 60 mg  60 mg Oral BID WC Norval Morton, MD   60 mg at 08/09/15 0657  . colestipol (COLESTID) tablet 1 g  1 g Oral BID Norval Morton, MD   1 g at 08/09/15 1033  . Darbepoetin Alfa (ARANESP) 100 MCG/0.5ML injection           . Darbepoetin Alfa (ARANESP) injection 100 mcg  100 mcg Intravenous Q Tue-HD Elmarie Shiley, MD      . docusate sodium (COLACE) capsule 100 mg  100 mg Oral BID Norval Morton, MD   100 mg at 08/09/15 1034  . doxercalciferol ([name]) 4 MCG/2ML injection           . doxercalciferol ([name]) injection 5 mcg  5 mcg Intravenous Q T,Th,Sa-HD Elmarie Shiley, MD      . enoxaparin (LOVENOX) injection 30 mg  30 mg Subcutaneous Q24H Norval Morton, MD   30 mg at 08/09/15 1033  . ezetimibe (ZETIA) tablet 10 mg  10 mg Oral QHS Rondell A Tamala Julian, MD      . gabapentin (NEURONTIN) capsule 300 mg  300 mg Oral QHS Rondell A Tamala Julian, MD      . HYDROcodone-acetaminophen (NORCO/VICODIN) 5-325 MG per tablet 1 tablet  1 tablet Oral Q6H PRN Brittney Kelly, PA-C      . insulin aspart (novoLOG) injection 0-9 Units  0-9 Units Subcutaneous TID WC Norval Morton, MD   1 Units at 08/09/15 0700  . lanthanum (FOSRENOL) chewable tablet 1,000 mg  1,000 mg Oral BID WC Norval Morton, MD   1,000 mg at 08/09/15 0656  . multivitamin (RENA-VIT) tablet 1 tablet  1 tablet Oral QHS Rondell A Smith, MD      . ondansetron (ZOFRAN)  injection 4 mg  4 mg Intravenous Q6H PRN Norval Morton, MD      . ondansetron (ZOFRAN) tablet 4 mg  4 mg Oral Q8H PRN Rondell A Tamala Julian, MD      . pantoprazole (PROTONIX) EC tablet 40 mg  40 mg Oral Daily Norval Morton, MD   40 mg at 08/09/15 1033  . pregabalin (LYRICA) capsule 100 mg  100 mg Oral Daily PRN Rondell A Tamala Julian, MD      . sodium chloride 0.9 % injection 3 mL  3 mL Intravenous Q12H Rondell Charmayne Sheer, MD   3 mL at 08/09/15 1034   Do not use this list as official  medication orders. Please verify with discharge summary.  Discharge Medications:   Medication List    ASK your doctor about these medications        allopurinol 100 MG tablet  Commonly known as:  ZYLOPRIM  Take 1 tablet (100 mg total) by mouth daily.     amLODipine 10 MG tablet  Commonly known as:  NORVASC  Take 10 mg by mouth daily.     calcium-vitamin D 500-200 MG-UNIT tablet  Commonly known as:  OSCAL WITH D  Take 3 tablets by mouth 3 (three) times daily with meals.     carvedilol 12.5 MG tablet  Commonly known as:  COREG  Take 12.5 mg by mouth 2 (two) times daily.     carvedilol 25 MG tablet  Commonly known as:  COREG  Take 0.5 tablets (12.5 mg total) by mouth 2 (two) times daily with a meal. Only on non-HD days     cinacalcet 60 MG tablet  Commonly known as:  SENSIPAR  Take 60 mg by mouth 2 (two) times daily.     colesevelam 625 MG tablet  Commonly known as:  WELCHOL  Take 3 tablets (1,875 mg total) by mouth 2 (two) times daily with a meal.     colestipol 1 G tablet  Commonly known as:  COLESTID  Take 1 g by mouth 2 (two) times daily.     DEXILANT 60 MG capsule  Generic drug:  dexlansoprazole  Take 1 tablet by mouth daily as needed (acid reflux).     docusate sodium 100 MG capsule  Commonly known as:  COLACE  Take 1 capsule (100 mg total) by mouth 2 (two) times daily.     enoxaparin 100 MG/ML injection  Commonly known as:  LOVENOX  Inject 0.3 mLs (30 mg total) into the skin daily.      ezetimibe 10 MG tablet  Commonly known as:  ZETIA  Take 1 tablet (10 mg total) by mouth daily.     gabapentin 300 MG capsule  Commonly known as:  NEURONTIN  Take 1 capsule (300 mg total) by mouth at bedtime.     lanthanum 1000 MG chewable tablet  Commonly known as:  FOSRENOL  Chew 1,000 mg by mouth 2 times daily at 12 noon and 4 pm.     multivitamin Tabs tablet  Take 1 tablet by mouth daily.     nateglinide 60 MG tablet  Commonly known as:  STARLIX  Take 1 tablet (60 mg total) by mouth 3 (three) times daily with meals.     ondansetron 4 MG tablet  Commonly known as:  ZOFRAN  Take 1 tablet (4 mg total) by mouth every 8 (eight) hours as needed for nausea or vomiting.     oxyCODONE 5 MG immediate release tablet  Commonly known as:  Oxy IR/ROXICODONE  Take 5 mg by mouth every 4 (four) hours as needed for moderate pain or severe pain.     oxyCODONE-acetaminophen 5-325 MG tablet  Commonly known as:  PERCOCET  Take 1-2 tablets by mouth every 4 (four) hours as needed for severe pain.     pregabalin 100 MG capsule  Commonly known as:  LYRICA  Take 100 mg by mouth daily as needed (nerve pain).        Relevant Imaging Results:  Relevant Lab Results:  Recent Labs    Additional Information SS# [id number]   [place] Intern, [id number]

## 2015-08-09 NOTE — Progress Notes (Signed)
Pt has home CPAP set up at bedside. Instructed to call RT if he needs help putting it on later.

## 2015-08-09 NOTE — Consult Note (Signed)
ORTHOPAEDIC CONSULTATION  REQUESTING PHYSICIAN: Bonnielee Haff, MD  Chief Complaint: S/P R total knee arthroplasty 08/02/15  HPI: [name] is a 64 y.o. male who had R total knee arthroplasty on 08/02/15.  The patient was discharged to [facility] rehab.  Over the past few days, his wife became concerned about his increased lethargic behavior and inability to participate in rehab.  He also began to complain of chest pain yesterday.  He was transported to the ED.  Cardiac enzymes have been negative thus far.  No EKG changes.  During his prior hospital admission, he also had some episodes of increased lethargy.  It was believed to be due to excess narcotics and his poor renal function.  Once the narcotic medication was decreased, his alertness improved and was able to participate in PT.  Will touch base with [facility], but believe that this could once again be the cause of his lethargic behavior.  Patient has ESRD and has dialysis T/H/S.    Past Medical History  Diagnosis Date  . Hyperthyroidism   . GASTROENTERITIS, VIRAL 10/14/2009  . ONYCHOMYCOSIS, TOENAILS 12/26/2007  . GOITER, MULTINODULAR 12/26/2007  . HYPERTHYROIDISM 02/02/2010  . DIABETES MELLITUS, TYPE II 02/01/2010  . DYSLIPIDEMIA 06/18/2007  . GOUT 06/18/2007  . Hypocalcemia 06/07/2010  . DEPRESSION 10/14/2009  . PERIPHERAL NEUROPATHY 06/18/2007  . HYPERTENSION 06/18/2007  . CONGESTIVE HEART FAILURE 06/18/2007  . DIASTOLIC HEART FAILURE, CHRONIC 02/06/2009  . Unspecified hypotension 01/30/2010  . PULMONARY NODULE, RIGHT LOWER LOBE 06/08/2009  . GERD 06/18/2007  . BENIGN PROSTATIC HYPERTROPHY 10/14/2009  . GYNECOMASTIA 07/17/2010  . NECK PAIN 07/31/2010  . FOOT PAIN 08/12/2008  . DIZZINESS 07/17/2010  . Other malaise and fatigue 11/24/2009  . GAIT DISTURBANCE 03/03/2010  . DYSPNEA 10/29/2008  . CHEST PAIN 03/29/2010  . TRANSAMINASES, SERUM, ELEVATED 02/01/2010  . COLONIC POLYPS, HX OF 10/14/2009  . Anemia 06/16/2011  . Ischemic  cardiomyopathy 06/16/2011  . Hyperparathyroidism, secondary ([place]) 06/16/2011  . OSA on CPAP 10/16/2011  . Hyperlipidemia 10/16/2011  . CAD, NATIVE VESSEL 02/06/2009    saw Dr. Missy Sabins last jan 2013  . Sleep apnea     cpap machine and o2  . Allergic rhinitis, cause unspecified 02/24/2014  . Transfusion history     none recent  . Hemodialysis access, fistula mature [facility])     Dialysis T-Th-Sa ([place]) Right upper arm fistula  . ESRD (end stage renal disease) on dialysis ([place]) 08/04/2010    "TTS;  " (04/18/2015)  . Dementia [id number]   Past Surgical History  Procedure Laterality Date  . Coronary angioplasty with stent placement  06/11/2008  . Cervical spine surgery  2/09    "to repair nerve problems in my left arm"  . Back surgery  1998  . Esophagogastroduodenoscopy  09/28/2011    Procedure: ESOPHAGOGASTRODUODENOSCOPY (EGD);  Surgeon: Missy Sabins, MD;  Location: [facility] ENDOSCOPY;  Service: Endoscopy;  Laterality: N/A;  . Givens capsule study  09/30/2011    Procedure: GIVENS CAPSULE STUDY;  Surgeon: Jeryl Columbia, MD;  Location: [facility] ENDOSCOPY;  Service: Endoscopy;  Laterality: N/A;  . Cholecystectomy    . Coronary angioplasty with stent placement  06/2007    TAXUS stent to RCA/notes 01/31/2011  . Tonsillectomy    . Av fistula placement  11/07/2011    Procedure: INSERTION OF ARTERIOVENOUS (AV) GORE-TEX GRAFT ARM;  Surgeon: Tinnie Gens, MD;  Location: [place];  Service: Vascular;  Laterality: Left;  . Arteriovenous graft  placement Right 2009    forearm/notes 02/01/2011  . Insertion of dialysis catheter Right 2014  . Insertion of dialysis catheter Left 02/11/2013    Procedure: INSERTION OF DIALYSIS CATHETER;  Surgeon: Conrad Winside, MD;  Location: [place];  Service: Vascular;  Laterality: Left;  Ultrasound guided  . Removal of a dialysis catheter Right 02/11/2013    Procedure: REMOVAL OF A DIALYSIS CATHETER;  Surgeon: Conrad Manzanita, MD;  Location: [place];  Service: Vascular;  Laterality:  Right;  . Bascilic vein transposition Right 02/27/2013    Procedure: BASCILIC VEIN TRANSPOSITION;  Surgeon: Mal Misty, MD;  Location: [place];  Service: Vascular;  Laterality: Right;  Right Basilic Vein Transposition   . Shuntogram N/A 09/20/2011    Procedure: Earney Mallet;  Surgeon: Conrad Scotland, MD;  Location: [facility] CATH LAB;  Service: Cardiovascular;  Laterality: N/A;  . Venogram N/A 01/26/2013    Procedure: VENOGRAM;  Surgeon: Angelia Mould, MD;  Location: [facility] CATH LAB;  Service: Cardiovascular;  Laterality: N/A;  . Esophagogastroduodenoscopy N/A 04/07/2015    Procedure: ESOPHAGOGASTRODUODENOSCOPY (EGD);  Surgeon: Teena Irani, MD;  Location: [facility] ENDOSCOPY;  Service: Endoscopy;  Laterality: N/A;  . Savory dilation N/A 04/07/2015    Procedure: SAVORY DILATION;  Surgeon: Teena Irani, MD;  Location: [facility] ENDOSCOPY;  Service: Endoscopy;  Laterality: N/A;  . Foreign body removal  09/2003    via upper endoscopy/notes 02/12/2011  . Esophagogastroduodenoscopy N/A 04/19/2015    Procedure: ESOPHAGOGASTRODUODENOSCOPY (EGD);  Surgeon: Arta Silence, MD;  Location: [facility] ENDOSCOPY;  Service: Endoscopy;  Laterality: N/A;  . Total knee arthroplasty Right 08/02/2015    Procedure: TOTAL KNEE ARTHROPLASTY;  Surgeon: Renette Butters, MD;  Location: [place];  Service: Orthopedics;  Laterality: Right;   Social History   Social History  . Marital Status: Married    Spouse Name: N/A  . Number of Children: N/A  . Years of Education: N/A   Occupational History  . disabled   . formerly Lawyer for [employer].    Social History Main Topics  . Smoking status: Former Smoker -- 1.00 packs/day for 25 years    Types: Cigarettes  . Smokeless tobacco: Former Systems developer    Quit date: 10/01/2005     Comment: Quit smoking 2007 Smoked x 25 years 1/2 ppd.  . Alcohol Use: 0.0 oz/week    0 Standard drinks or equivalent per week     Comment: occassional   . Drug Use: No  . Sexual Activity: Not Asked   Other Topics Concern   . None   Social History Narrative   Family History  Problem Relation Age of Onset  . Heart disease Father   . Heart disease Sister    Allergies  Allergen Reactions  . Cephalexin Swelling and Other (See Comments)    Tongue swelling  . Statins     Weak muscles  . Ciprofloxacin Rash   Prior to Admission medications   Medication Sig Start Date End Date Taking? Authorizing Provider  allopurinol (ZYLOPRIM) 100 MG tablet Take 1 tablet (100 mg total) by mouth daily. 06/08/15  Yes Biagio Borg, MD  amLODipine (NORVASC) 10 MG tablet Take 10 mg by mouth daily. 04/13/15  Yes Historical Provider, MD  calcium-vitamin D (OSCAL WITH D) 500-200 MG-UNIT per tablet Take 3 tablets by mouth 3 (three) times daily with meals.    Yes Historical Provider, MD  carvedilol (COREG) 12.5 MG tablet Take 12.5 mg by mouth 2 (two) times daily.  Yes Historical Provider, MD  cinacalcet (SENSIPAR) 60 MG tablet Take 60 mg by mouth 2 (two) times daily.    Yes Historical Provider, MD  colestipol (COLESTID) 1 G tablet Take 1 g by mouth 2 (two) times daily.   Yes Historical Provider, MD  DEXILANT 60 MG capsule Take 1 tablet by mouth daily as needed (acid reflux).  04/22/15  Yes Historical Provider, MD  docusate sodium (COLACE) 100 MG capsule Take 1 capsule (100 mg total) by mouth 2 (two) times daily. 08/02/15  Yes Montina Dorrance, PA-C  enoxaparin (LOVENOX) 100 MG/ML injection Inject 0.3 mLs (30 mg total) into the skin daily. 08/02/15  Yes Shala Baumbach Claiborne Billings, PA-C  ezetimibe (ZETIA) 10 MG tablet Take 1 tablet (10 mg total) by mouth daily. Patient taking differently: Take 10 mg by mouth at bedtime.  12/29/14  Yes Jolaine Artist, MD  gabapentin (NEURONTIN) 300 MG capsule Take 1 capsule (300 mg total) by mouth at bedtime. 06/08/15  Yes Biagio Borg, MD  lanthanum (FOSRENOL) 1000 MG chewable tablet Chew 1,000 mg by mouth 2 times daily at 12 noon and 4 pm.   Yes Historical Provider, MD  multivitamin (RENA-VIT) TABS tablet Take 1 tablet  by mouth daily.     Yes Historical Provider, MD  nateglinide (STARLIX) 60 MG tablet Take 1 tablet (60 mg total) by mouth 3 (three) times daily with meals. 03/14/15  Yes Renato Shin, MD  ondansetron (ZOFRAN) 4 MG tablet Take 1 tablet (4 mg total) by mouth every 8 (eight) hours as needed for nausea or vomiting. 08/02/15  Yes Bricia Taher, PA-C  oxyCODONE (OXY IR/ROXICODONE) 5 MG immediate release tablet Take 5 mg by mouth every 4 (four) hours as needed for moderate pain or severe pain.   Yes Historical Provider, MD  pregabalin (LYRICA) 100 MG capsule Take 100 mg by mouth daily as needed (nerve pain).   Yes Historical Provider, MD  carvedilol (COREG) 25 MG tablet Take 0.5 tablets (12.5 mg total) by mouth 2 (two) times daily with a meal. Only on non-HD days Patient not taking: Reported on 08/08/2015 03/10/14   Jolaine Artist, MD  colesevelam [facility]) 625 MG tablet Take 3 tablets (1,875 mg total) by mouth 2 (two) times daily with a meal. Patient not taking: Reported on 08/08/2015 06/08/15   Biagio Borg, MD  oxyCODONE-acetaminophen (PERCOCET) 5-325 MG tablet Take 1-2 tablets by mouth every 4 (four) hours as needed for severe pain. Patient not taking: Reported on 08/08/2015 08/02/15   Lovett Calender, PA-C   Dg Chest 2 View  08/08/2015  CLINICAL DATA:  Sudden onset of substernal chest pain tonight and shortness of breath. Recent knee surgery. EXAM: CHEST  2 VIEW COMPARISON:  06/26/2013 FINDINGS: The heart is borderline enlarged but stable. The mediastinal and hilar contours are within normal limits and unchanged. Mild tortuosity of the thoracic aorta. The lungs demonstrate streaky scarring changes but no infiltrates, edema or effusions. The bony thorax is intact. IMPRESSION: Chronic basilar scarring changes but no acute pulmonary findings. Electronically Signed   By: Marijo Sanes M.D.   On: 08/08/2015 21:11    Positive ROS: All other systems have been reviewed and were otherwise negative with the exception  of those mentioned in the HPI and as above.  Labs cbc  Recent Labs  08/09/15 0045 08/09/15 0116  WBC 8.9  --   HGB 9.3* 10.5*  HCT 30.0* 31.0*  PLT 302  --     Labs inflam No results for  input(s): CRP in the last 72 hours.  Invalid input(s): ESR  Labs coag No results for input(s): INR, PTT in the last 72 hours.  Invalid input(s): PT   Recent Labs  08/09/15 0045 08/09/15 0116  NA 140 137  K 5.0 4.8  CL 95* 98*  CO2 26  --   GLUCOSE 141* 139*  BUN 86* 87*  CREATININE 12.82* 12.60*  CALCIUM 10.1  --     Physical Exam: Filed Vitals:   08/09/15 0408  BP: 133/40  Pulse: 70  Temp: 98 F (36.7 C)  Resp: 16   General: Very lethargic and confused but arousable  Cardiovascular: No pedal edema Respiratory: No cyanosis, no use of accessory musculature GI: No organomegaly, abdomen is soft and non-tender Skin: No lesions in the area of chief complaint other than those listed below in MSK exam.  Neurologic: Sensation intact distally Psychiatric: Patient is competent for consent with normal mood and affect Lymphatic: No axillary or cervical lymphadenopathy  MUSCULOSKELETAL:  R knee incision is healing well.  No active drainage or signs of infection.  Patients inability to participate in the exam makes ROM assessment difficult.  Sensation intact with 2+ distal pulses.  Other extremities are atraumatic with painless ROM and NVI.  Assessment: R total knee arthroplasty 08/02/2015 with readmission on 08/08/2015 for increased lethargic behavior and chest pain   Plan: Spoke with the nurse taking care of the patient at [facility] rehab who reports giving the patient 10m of oxycodone every 6hrs the past 48hrs for pain control.  Prior to that, the patient had received only one dose of 532moxycodone on 08/06/15 and was alert and participating in PT.  During the patients post op course, he had similar lethargic behavior resulting from high doses of narcotic pain medication.  At this  time, I recommend decreasing the pain medication to hydrocodone 5/[id number] and seeing if his lethargic behavior improves.  I have touch base with the patient's wife and son and they are aware of the plan.  The medicine team is also helping to arrange the patient's dialysis for today.  Will order PT/OT.   KelGae DryA-C Cell [phone] [id number]/05/2015 10:03 AM

## 2015-08-09 NOTE — Progress Notes (Signed)
  Patient admitted earlier this morning. H&P reviewed. Patient seen and examined.  Patient denies any pain currently. No nausea or vomiting. His son is at bedside.  Vital signs reviewed.  Patient is awake and alert. No distress Lungs are clear to auscultation bilaterally S1, S2 is normal, regular. No S3, S4. No rubs, murmurs or bruit Abdomen is soft, nontender, nondistended. Bowel sounds are present. No masses or organomegaly Dressing noted over the right knee. Some swelling over the right lower extremity.  Patient admitted with complaints of chest pain and altered mental status. CT scan of the chest is pending to rule out pulmonary embolism. Will also obtain lower extremity Doppler. Patient has a history of end-stage renal disease on dialysis. Nephrology will be consulted. Discussed with PA with orthopedics. She apparently gets encephalopathic and somnolent with narcotics. Monitor closely.  Further management as discussed in H&P.  [name] 08/09/2015 1:52 PM

## 2015-08-10 ENCOUNTER — Encounter (HOSPITAL_COMMUNITY): Payer: Self-pay | Admitting: Radiology

## 2015-08-10 ENCOUNTER — Observation Stay (HOSPITAL_COMMUNITY): Payer: Medicare Other

## 2015-08-10 ENCOUNTER — Ambulatory Visit (HOSPITAL_BASED_OUTPATIENT_CLINIC_OR_DEPARTMENT_OTHER): Payer: Medicare Other

## 2015-08-10 DIAGNOSIS — Z9181 History of falling: Secondary | ICD-10-CM | POA: Diagnosis not present

## 2015-08-10 DIAGNOSIS — R609 Edema, unspecified: Secondary | ICD-10-CM

## 2015-08-10 DIAGNOSIS — M1711 Unilateral primary osteoarthritis, right knee: Secondary | ICD-10-CM | POA: Diagnosis not present

## 2015-08-10 DIAGNOSIS — I120 Hypertensive chronic kidney disease with stage 5 chronic kidney disease or end stage renal disease: Secondary | ICD-10-CM | POA: Diagnosis not present

## 2015-08-10 DIAGNOSIS — I132 Hypertensive heart and chronic kidney disease with heart failure and with stage 5 chronic kidney disease, or end stage renal disease: Secondary | ICD-10-CM | POA: Diagnosis not present

## 2015-08-10 DIAGNOSIS — R488 Other symbolic dysfunctions: Secondary | ICD-10-CM | POA: Diagnosis not present

## 2015-08-10 DIAGNOSIS — N2581 Secondary hyperparathyroidism of renal origin: Secondary | ICD-10-CM | POA: Diagnosis not present

## 2015-08-10 DIAGNOSIS — R1311 Dysphagia, oral phase: Secondary | ICD-10-CM | POA: Diagnosis not present

## 2015-08-10 DIAGNOSIS — Z992 Dependence on renal dialysis: Secondary | ICD-10-CM | POA: Diagnosis not present

## 2015-08-10 DIAGNOSIS — E785 Hyperlipidemia, unspecified: Secondary | ICD-10-CM | POA: Diagnosis not present

## 2015-08-10 DIAGNOSIS — D509 Iron deficiency anemia, unspecified: Secondary | ICD-10-CM | POA: Diagnosis not present

## 2015-08-10 DIAGNOSIS — I5032 Chronic diastolic (congestive) heart failure: Secondary | ICD-10-CM | POA: Diagnosis not present

## 2015-08-10 DIAGNOSIS — R4182 Altered mental status, unspecified: Secondary | ICD-10-CM | POA: Diagnosis not present

## 2015-08-10 DIAGNOSIS — Z471 Aftercare following joint replacement surgery: Secondary | ICD-10-CM | POA: Diagnosis not present

## 2015-08-10 DIAGNOSIS — I110 Hypertensive heart disease with heart failure: Secondary | ICD-10-CM | POA: Diagnosis not present

## 2015-08-10 DIAGNOSIS — R0789 Other chest pain: Secondary | ICD-10-CM | POA: Diagnosis not present

## 2015-08-10 DIAGNOSIS — D631 Anemia in chronic kidney disease: Secondary | ICD-10-CM | POA: Diagnosis not present

## 2015-08-10 DIAGNOSIS — R262 Difficulty in walking, not elsewhere classified: Secondary | ICD-10-CM | POA: Diagnosis not present

## 2015-08-10 DIAGNOSIS — M109 Gout, unspecified: Secondary | ICD-10-CM | POA: Diagnosis not present

## 2015-08-10 DIAGNOSIS — N186 End stage renal disease: Secondary | ICD-10-CM | POA: Diagnosis not present

## 2015-08-10 DIAGNOSIS — R0902 Hypoxemia: Secondary | ICD-10-CM | POA: Diagnosis not present

## 2015-08-10 DIAGNOSIS — E1340 Other specified diabetes mellitus with diabetic neuropathy, unspecified: Secondary | ICD-10-CM | POA: Diagnosis not present

## 2015-08-10 DIAGNOSIS — G9340 Encephalopathy, unspecified: Secondary | ICD-10-CM

## 2015-08-10 DIAGNOSIS — E1129 Type 2 diabetes mellitus with other diabetic kidney complication: Secondary | ICD-10-CM | POA: Diagnosis not present

## 2015-08-10 DIAGNOSIS — G92 Toxic encephalopathy: Secondary | ICD-10-CM | POA: Diagnosis not present

## 2015-08-10 DIAGNOSIS — R079 Chest pain, unspecified: Secondary | ICD-10-CM | POA: Diagnosis not present

## 2015-08-10 DIAGNOSIS — M25551 Pain in right hip: Secondary | ICD-10-CM | POA: Diagnosis not present

## 2015-08-10 DIAGNOSIS — E119 Type 2 diabetes mellitus without complications: Secondary | ICD-10-CM | POA: Diagnosis not present

## 2015-08-10 DIAGNOSIS — W19XXXA Unspecified fall, initial encounter: Secondary | ICD-10-CM | POA: Diagnosis not present

## 2015-08-10 DIAGNOSIS — M6281 Muscle weakness (generalized): Secondary | ICD-10-CM | POA: Diagnosis not present

## 2015-08-10 DIAGNOSIS — Z96651 Presence of right artificial knee joint: Secondary | ICD-10-CM | POA: Diagnosis not present

## 2015-08-10 DIAGNOSIS — F0390 Unspecified dementia without behavioral disturbance: Secondary | ICD-10-CM | POA: Diagnosis not present

## 2015-08-10 DIAGNOSIS — M25561 Pain in right knee: Secondary | ICD-10-CM | POA: Diagnosis not present

## 2015-08-10 DIAGNOSIS — M79604 Pain in right leg: Secondary | ICD-10-CM | POA: Diagnosis not present

## 2015-08-10 DIAGNOSIS — E1142 Type 2 diabetes mellitus with diabetic polyneuropathy: Secondary | ICD-10-CM | POA: Diagnosis not present

## 2015-08-10 LAB — COMPREHENSIVE METABOLIC PANEL
ALK PHOS: 79 U/L (ref 38–126)
ALT: 33 U/L (ref 17–63)
AST: 42 U/L — AB (ref 15–41)
Albumin: 2.8 g/dL — ABNORMAL LOW (ref 3.5–5.0)
Anion gap: 16 — ABNORMAL HIGH (ref 5–15)
BUN: 70 mg/dL — AB (ref 6–20)
CALCIUM: 10 mg/dL (ref 8.9–10.3)
CO2: 24 mmol/L (ref 22–32)
CREATININE: 11.02 mg/dL — AB (ref 0.61–1.24)
Chloride: 98 mmol/L — ABNORMAL LOW (ref 101–111)
GFR, EST AFRICAN AMERICAN: 5 mL/min — AB (ref >60)
GFR, EST NON AFRICAN AMERICAN: 4 mL/min — AB (ref >60)
Glucose, Bld: 123 mg/dL — ABNORMAL HIGH (ref 65–99)
Potassium: 5.3 mmol/L — ABNORMAL HIGH (ref 3.5–5.1)
Sodium: 138 mmol/L (ref 135–145)
Total Bilirubin: 1.1 mg/dL (ref 0.3–1.2)
Total Protein: 7.2 g/dL (ref 6.5–8.1)

## 2015-08-10 LAB — GLUCOSE, CAPILLARY
GLUCOSE-CAPILLARY: 135 mg/dL — AB (ref 65–99)
GLUCOSE-CAPILLARY: 115 mg/dL — AB (ref 65–99)
Glucose-Capillary: 95 mg/dL (ref 65–99)

## 2015-08-10 MED ORDER — SODIUM CHLORIDE 0.9 % IV BOLUS (SEPSIS)
250.0000 mL | Freq: Once | INTRAVENOUS | Status: AC
  Administered 2015-08-10: 250 mL via INTRAVENOUS

## 2015-08-10 MED ORDER — ENOXAPARIN SODIUM 100 MG/ML ~~LOC~~ SOLN: 30.0000 mg | SUBCUTANEOUS | Status: DC

## 2015-08-10 MED ORDER — HYDROCODONE-ACETAMINOPHEN 5-325 MG PO TABS: 1.0000 | ORAL_TABLET | Freq: Four times a day (QID) | ORAL | Status: DC | PRN

## 2015-08-10 MED ORDER — ACETAMINOPHEN 325 MG PO TABS: 325.0000 mg | ORAL_TABLET | ORAL | Status: DC | PRN

## 2015-08-10 MED ORDER — IOHEXOL 350 MG/ML SOLN
80.0000 mL | Freq: Once | INTRAVENOUS | Status: AC | PRN
  Administered 2015-08-10: 80 mL via INTRAVENOUS

## 2015-08-10 NOTE — Progress Notes (Signed)
MD paged regarding Pt low BP. New orders given. Will continue to monitor closely.  Raliegh Ip RN

## 2015-08-10 NOTE — Progress Notes (Signed)
Patient ID: [name], male   DOB: 1950-10-22, 64 y.o.   MRN: [id number]  [facility] Progress Note   Assessment/ Plan:   1. Altered mental status: Suspected medication induced from narcotic therapy/Lyrica- no improved 2. Chest pain: No clear evidence of acute coronary syndrome from enzymes/EKG-awaiting CT angiogram to evaluate for possible PE 3. End-stage renal disease: s/p HD yesterday and without acute HD needs at this time- next HD tomorrow at OP HD unit if discharged today.. 4. Anemia: Hemoglobin levels comparatively better compared to last week-continue ESA. He does not have any overt losses. 5. Metabolic bone disease: Continue calcium carbonate and Sensipar 6. Hypertension: Monitor closely for need to decrease antihypertensive doses given borderline hypotension.  Subjective:   Reports to be feeling better- with some amnesia about events leading up to his hospitalization and events from yesterday.   Objective:   BP 81/47 mmHg  Pulse 95  Temp(Src) 98.3 F (36.8 C) (Oral)  Resp 18  Ht 6' (1.829 m)  Wt 104.6 kg (230 lb 9.6 oz)  BMI 31.27 kg/m2  SpO2 99%  Physical Exam: [id number] sitting in bed- eating breakfast. [id number] RRR, Normal S1 with loud S2 Resp:CTA bilaterally, no rales/rhonchi [id number], flat, non-tender, BS normal Ext:No LE edema. LUA AVF +T/B   Labs: BMET  Recent Labs Lab 08/04/15 0745 08/09/15 0045 08/09/15 0116 08/10/15 0405  NA 139 140 137 138  K 5.1 5.0 4.8 5.3*  CL 98* 95* 98* 98*  CO2 26 26  --  24  GLUCOSE 97 141* 139* 123*  BUN 63* 86* 87* 70*  CREATININE 11.71* 12.82* 12.60* 11.02*  CALCIUM 9.5 10.1  --  10.0  PHOS 7.0*  --   --   --    CBC  Recent Labs Lab 08/04/15 0745 08/09/15 0045 08/09/15 0116  WBC 10.6* 8.9  --   HGB 8.9* 9.3* 10.5*  HCT 28.2* 30.0* 31.0*  MCV 89.8 88.5  --   PLT 225 302  --    Medications:    . allopurinol  100 mg Oral Daily  . amLODipine  10 mg Oral Daily  . calcium-vitamin D   3 tablet Oral TID WC  . carvedilol  12.5 mg Oral BID WC  . cinacalcet  60 mg Oral BID WC  . colestipol  1 g Oral BID  . darbepoetin (ARANESP) injection - DIALYSIS  100 mcg Intravenous Q Tue-HD  . docusate sodium  100 mg Oral BID  . doxercalciferol  5 mcg Intravenous Q T,Th,Sa-HD  . enoxaparin  30 mg Subcutaneous Q24H  . ezetimibe  10 mg Oral QHS  . gabapentin  300 mg Oral QHS  . insulin aspart  0-9 Units Subcutaneous TID WC  . lanthanum  1,000 mg Oral BID WC  . multivitamin  1 tablet Oral QHS  . pantoprazole  40 mg Oral Daily  . sodium chloride  3 mL Intravenous Q12H   Elmarie Shiley, MD 08/10/2015, 9:38 AM

## 2015-08-10 NOTE — Discharge Summary (Signed)
[name], is a 64 y.o. male  DOB [birth year]  MRN [id number].  Admission date:  08/08/2015  Admitting Physician  Norval Morton, MD  Discharge Date:  08/10/2015   Primary MD  [name], MD  Recommendations for primary care physician for things to follow:  - Patient to continue his hemodialysis on TTS schedule, Nexium dialysis is scheduled for tomorrow. - Patient on subcutaneous Lovenox for DVT prophylaxis for total of 30 days postoperatively, this can be stopped sooner if patient is fully ambulatory. -Patient to maintain CPM  on right lower extremity     CPM number of degree: 60    CPM if less than 90, increased by 15 per day till  range of motion is achieved   Admission Diagnosis  Chest pain, unspecified chest pain type [R07.9]   Discharge Diagnosis  Chest pain, unspecified chest pain type [R07.9]   Principal Problem:   Acute encephalopathy Active Problems:   GOUT   Anemia   CAD (coronary artery disease)   ESRD on dialysis [facility])   Hyperthyroidism   Chest pain   S/P right total knee replacement      Past Medical History  Diagnosis Date  . Hyperthyroidism   . GASTROENTERITIS, VIRAL 10/14/2009  . ONYCHOMYCOSIS, TOENAILS 12/26/2007  . GOITER, MULTINODULAR 12/26/2007  . HYPERTHYROIDISM 02/02/2010  . DIABETES MELLITUS, TYPE II 02/01/2010  . DYSLIPIDEMIA 06/18/2007  . GOUT 06/18/2007  . Hypocalcemia 06/07/2010  . DEPRESSION 10/14/2009  . PERIPHERAL NEUROPATHY 06/18/2007  . HYPERTENSION 06/18/2007  . CONGESTIVE HEART FAILURE 06/18/2007  . DIASTOLIC HEART FAILURE, CHRONIC 02/06/2009  . Unspecified hypotension 01/30/2010  . PULMONARY NODULE, RIGHT LOWER LOBE 06/08/2009  . GERD 06/18/2007  . BENIGN PROSTATIC HYPERTROPHY 10/14/2009  . GYNECOMASTIA 07/17/2010  . NECK PAIN 07/31/2010  . FOOT PAIN 08/12/2008  . DIZZINESS 07/17/2010  . Other malaise and fatigue 11/24/2009  . GAIT DISTURBANCE 03/03/2010  .  DYSPNEA 10/29/2008  . CHEST PAIN 03/29/2010  . TRANSAMINASES, SERUM, ELEVATED 02/01/2010  . COLONIC POLYPS, HX OF 10/14/2009  . Anemia 06/16/2011  . Ischemic cardiomyopathy 06/16/2011  . Hyperparathyroidism, secondary ([place]) 06/16/2011  . OSA on CPAP 10/16/2011  . Hyperlipidemia 10/16/2011  . CAD, NATIVE VESSEL 02/06/2009    saw Dr. Missy Sabins last jan 2013  . Sleep apnea     cpap machine and o2  . Allergic rhinitis, cause unspecified 02/24/2014  . Transfusion history     none recent  . Hemodialysis access, fistula [facility])     Dialysis T-Th-Sa ([place]) Right upper arm fistula  . ESRD (end stage renal disease) on dialysis ([place]) 08/04/2010    "TTS;  " (04/18/2015)  . Dementia [id number]  . Renal insufficiency     Past Surgical History  Procedure Laterality Date  . Coronary angioplasty with stent placement  06/11/2008  . Cervical spine surgery  2/09    "to repair nerve problems in my left arm"  . Back surgery  1998  . Esophagogastroduodenoscopy  09/28/2011    Procedure:  ESOPHAGOGASTRODUODENOSCOPY (EGD);  Surgeon: Missy Sabins, MD;  Location: [facility] ENDOSCOPY;  Service: Endoscopy;  Laterality: N/A;  . Givens capsule study  09/30/2011    Procedure: GIVENS CAPSULE STUDY;  Surgeon: Jeryl Columbia, MD;  Location: [facility] ENDOSCOPY;  Service: Endoscopy;  Laterality: N/A;  . Cholecystectomy    . Coronary angioplasty with stent placement  06/2007    TAXUS stent to RCA/notes 01/31/2011  . Tonsillectomy    . Av fistula placement  11/07/2011    Procedure: INSERTION OF ARTERIOVENOUS (AV) GORE-TEX GRAFT ARM;  Surgeon: Tinnie Gens, MD;  Location: [place];  Service: Vascular;  Laterality: Left;  . Arteriovenous graft placement Right 2009    forearm/notes 02/01/2011  . Insertion of dialysis catheter Right 2014  . Insertion of dialysis catheter Left 02/11/2013    Procedure: INSERTION OF DIALYSIS CATHETER;  Surgeon: Conrad Petersburg Borough, MD;  Location: [place];  Service: Vascular;  Laterality: Left;  Ultrasound  guided  . Removal of a dialysis catheter Right 02/11/2013    Procedure: REMOVAL OF A DIALYSIS CATHETER;  Surgeon: Conrad Hamilton, MD;  Location: [place];  Service: Vascular;  Laterality: Right;  . Bascilic vein transposition Right 02/27/2013    Procedure: BASCILIC VEIN TRANSPOSITION;  Surgeon: Mal Misty, MD;  Location: [place];  Service: Vascular;  Laterality: Right;  Right Basilic Vein Transposition   . Shuntogram N/A 09/20/2011    Procedure: Earney Mallet;  Surgeon: Conrad San Pedro, MD;  Location: [facility] CATH LAB;  Service: Cardiovascular;  Laterality: N/A;  . Venogram N/A 01/26/2013    Procedure: VENOGRAM;  Surgeon: Angelia Mould, MD;  Location[facility] C[facility] LAB;  Service: Cardiovascular;  Laterality: N/A;  . Esophagogastroduodenoscopy N/A 04/07/2015    Procedure: ESOPHAGOGASTRODUODENOSCOPY (EGD);  Surgeon: Teena Irani, MD;  Location: [facility] ENDOSCOPY;  Service: Endoscopy;  Laterality: N/A;  . Savory dilation N/A 04/07/2015    Procedure: SAVORY DILATION;  Surgeon: Teena Irani, MD;  Location: [facility] ENDOSCOPY;  Service: Endoscopy;  Laterality: N/A;  . Foreign body removal  09/2003    via upper endoscopy/notes 02/12/2011  . Esophagogastroduodenoscopy N/A 04/19/2015    Procedure: ESOPHAGOGASTRODUODENOSCOPY (EGD);  Surgeon: Arta Silence, MD;  Location: [facility] ENDOSCOPY;  Service: Endoscopy;  Laterality: N/A;  . Total knee arthroplasty Right 08/02/2015    Procedure: TOTAL KNEE ARTHROPLASTY;  Surgeon: Renette Butters, MD;  Location: [place];  Service: Orthopedics;  Laterality: Right;       History of present illness and  Hospital Course:     Kindly see H&P for history of present illness and admission details, please review complete Labs, Consult reports and Test reports for all details in brief  HPI  from the history and physical done on the day of admission 11/8 Patient is a 64 year old male with a past medical history significant for ESRD on HD T/TH/Sat, gout, diabetes mellitus type 2, HTN, and HLD; who recently had  had undergone a total knee replacement on 08/02/2015. Following the surgical procedure he had gone to [facility] rehabilitation facility and in the last day or so had become more altered. History is obtained through the patient's wife was present at bedside as he is lethargic, but arousable to verbal commands. Apparently patient had been at rehabilitation receiving physical therapy at noon. He had acute onset of complaints of chest pain on the left-hand side with nausea, vomiting, and diaphoresis. Initial workup on arrival included EKG which was seen to be within normal limits and negative troponins. Last echocardiogram was in March 2016  with a normal ejection fraction of 50-55%. Wife states that in the days prior to coming in you. More lethargic and had decreased appetite. She also states he was more confused than baseline and would have this gloss over. to his eyes that had never been there previously.   Hospital Course   Acute encephalopathy:  - Patient acutely altered beyond baseline on admission.  - This is most likely related to pain medication , was Held  during hospital stay with significant improvement of mental status . Will discharge or Norco one tablet every 6 hours as needed. - Afebrile, no leukocytosis  Chest pain - Patient had complaints of chest pain CTA chest done with no evidence of PE, Bilateral venous Dopplers were done with no evidence of  DVT. -  Troponins negative time 3  -  no recurrence during hospital stay   recent total knee replacement  - Orthopedic consult appreciated   End-stage renal disease - Was consulted, patient is on hemodialysis TTS schedule  Gout - Continue with allopurinol  Incidental finding of pulmonary nodule on CT chest - Discussed at length with Wife, she reports this has been recently worked at [facility], where he had biopsy in this year with benign workup.   diabetes mellitus - Continue with home medication   hypertension  -  We'll discontinue Norvasc on discharge, continue with Coreg giving blood pressure on lower side during hospital stay   Discharge Condition:  Stable    Follow UP  Follow-up Information    Follow up with [name], MD.   Specialties:  Internal Medicine, Radiology   Why:  After discharge from SNF   Contact information:   [address] [place] [address] [phone]       Follow up with MURPHY, Ernesta Amble, MD In 10 days.   Specialty:  Orthopedic Surgery   Contact information:   [address] [place] [address] [phone]         Discharge Instructions  and  Discharge Medications     Discharge Instructions    Discharge instructions    Complete by:  As directed   Follow with Primary MD [name], MD after discharge from SNF.  Get CBC, CMP, 2 view Chest X ray checked  by Primary MD next visit.    Activity: As tolerated with Full fall precautions use walker/cane & assistance as needed, weightbearing as tolerated on right lower extremity.   Disposition SNF   Diet: Heart Healthy , RENAL modified, with feeding assistance and aspiration precautions.  For Heart failure patients - Check your Weight same time everyday, if you gain over 2 pounds, or you develop in leg swelling, experience more shortness of breath or chest pain, call your Primary MD immediately. Follow Cardiac Low Salt Diet and 1.5 lit/day fluid restriction.   On your next visit with your primary care physician please Get Medicines reviewed and adjusted.   Please request your Prim.MD to go over all Hospital Tests and Procedure/Radiological results at the follow up, please get all Hospital records sent to your Prim MD by signing hospital release before you go home.   If you experience worsening of your admission symptoms, develop shortness of breath, life threatening emergency, suicidal or homicidal thoughts you must seek medical attention immediately by calling 911 or calling  your MD immediately  if symptoms less severe.  You Must read complete instructions/literature along with all the possible adverse reactions/side effects for all  the Medicines you take and that have been prescribed to you. Take any new Medicines after you have completely understood and accpet all the possible adverse reactions/side effects.   Do not drive, operating heavy machinery, perform activities at heights, swimming or participation in water activities or provide baby sitting services if your were admitted for syncope or siezures until you have seen by Primary MD or a Neurologist and advised to do so again.  Do not drive when taking Pain medications.    Do not take more than prescribed Pain, Sleep and Anxiety Medications  Special Instructions: If you have smoked or chewed Tobacco  in the last 2 yrs please stop smoking, stop any regular Alcohol  and or any Recreational drug use.  Wear Seat belts while driving.   Please note  You were cared for by a hospitalist during your hospital stay. If you have any questions about your discharge medications or the care you received while you were in the hospital after you are discharged, you can call the unit and asked to speak with the hospitalist on call if the hospitalist that took care of you is not available. Once you are discharged, your primary care physician will handle any further medical issues. Please note that NO REFILLS for any discharge medications will be authorized once you are discharged, as it is imperative that you return to your primary care physician (or establish a relationship with a primary care physician if you do not have one) for your aftercare needs so that they can reassess your need for medications and monitor your lab values.            Medication List    STOP taking these medications        amLODipine 10 MG tablet  Commonly known as:  NORVASC     gabapentin 300 MG capsule  Commonly known as:  NEURONTIN      oxyCODONE 5 MG immediate release tablet  Commonly known as:  Oxy IR/ROXICODONE     oxyCODONE-acetaminophen 5-325 MG tablet  Commonly known as:  PERCOCET     pregabalin 100 MG capsule  Commonly known as:  LYRICA      TAKE these medications        acetaminophen 325 MG tablet  Commonly known as:  TYLENOL  Take 1 tablet (325 mg total) by mouth every 4 (four) hours as needed for headache or mild pain.     allopurinol 100 MG tablet  Commonly known as:  ZYLOPRIM  Take 1 tablet (100 mg total) by mouth daily.     calcium-vitamin D 500-200 MG-UNIT tablet  Commonly known as:  OSCAL WITH D  Take 3 tablets by mouth 3 (three) times daily with meals.     carvedilol 12.5 MG tablet  Commonly known as:  COREG  Take 12.5 mg by mouth 2 (two) times daily.     cinacalcet 60 MG tablet  Commonly known as:  SENSIPAR  Take 60 mg by mouth 2 (two) times daily.     colesevelam 625 MG tablet  Commonly known as:  WELCHOL  Take 3 tablets (1,875 mg total) by mouth 2 (two) times daily with a meal.     colestipol 1 G tablet  Commonly known as:  COLESTID  Take 1 g by mouth 2 (two) times daily.     DEXILANT 60 MG capsule  Generic drug:  dexlansoprazole  Take 1 tablet by mouth daily as needed (acid reflux).     docusate sodium  100 MG capsule  Commonly known as:  COLACE  Take 1 capsule (100 mg total) by mouth 2 (two) times daily.     enoxaparin 100 MG/ML injection  Commonly known as:  LOVENOX  Inject 0.3 mLs (30 mg total) into the skin daily. For total of 30 day for DVT prophylaxis,  it can't be stopped if patient is fully ambulatory prior to 30 DAYS.     ezetimibe 10 MG tablet  Commonly known as:  ZETIA  Take 1 tablet (10 mg total) by mouth daily.     HYDROcodone-acetaminophen 5-325 MG tablet  Commonly known as:  NORCO/VICODIN  Take 1 tablet by mouth every 6 (six) hours as needed for severe pain.     lanthanum 1000 MG chewable tablet  Commonly known as:  FOSRENOL  Chew 1,000 mg by mouth 2  times daily at 12 noon and 4 pm.     multivitamin Tabs tablet  Take 1 tablet by mouth daily.     nateglinide 60 MG tablet  Commonly known as:  STARLIX  Take 1 tablet (60 mg total) by mouth 3 (three) times daily with meals.     ondansetron 4 MG tablet  Commonly known as:  ZOFRAN  Take 1 tablet (4 mg total) by mouth every 8 (eight) hours as needed for nausea or vomiting.          Diet and Activity recommendation: See Discharge Instructions above   Consults obtained -  Orthopedic   nephrology    Major procedures and Radiology Reports - PLEASE review detailed and final reports for all details, in brief -   HD on 08/09/2015    Dg Chest 2 View  08/08/2015  CLINICAL DATA:  Sudden onset of substernal chest pain tonight and shortness of breath. Recent knee surgery. EXAM: CHEST  2 VIEW COMPARISON:  06/26/2013 FINDINGS: The heart is borderline enlarged but stable. The mediastinal and hilar contours are within normal limits and unchanged. Mild tortuosity of the thoracic aorta. The lungs demonstrate streaky scarring changes but no infiltrates, edema or effusions. The bony thorax is intact. IMPRESSION: Chronic basilar scarring changes but no acute pulmonary findings. Electronically Signed   By: Marijo Sanes M.D.   On: 08/08/2015 21:11   Dg Pelvis 1-2 Views  07/13/2015  CLINICAL DATA:  Pelvic pain EXAM: PELVIS - 1-2 VIEW COMPARISON:  None. FINDINGS: Single frontal view of the pelvis submitted. Postsurgical changes are noted lower lumbar spine. Study is limited by diffuse osteopenia and patient's large body habitus. No acute fracture or subluxation. Mild degenerative changes bilateral hip joints with mild narrowing superior hip joint space. Mild degenerative changes pubic symphysis. IMPRESSION: No acute fracture or subluxation.  Mild degenerative changes. Electronically Signed   By: Lahoma Crocker M.D.   On: 07/13/2015 14:47   Ct Angio Chest Pe W/cm &/or Wo Cm  08/10/2015  CLINICAL DATA:  Knee  replacement surgery on 08/02/2015. Chest pain and hypoxia. History of end-stage renal disease, coronary artery disease, diabetes and hypertension. EXAM: CT ANGIOGRAPHY CHEST WITH CONTRAST TECHNIQUE: Multidetector CT imaging of the chest was performed using the standard protocol during bolus administration of intravenous contrast. Multiplanar CT image reconstructions and MIPs were obtained to evaluate the vascular anatomy. CONTRAST:  33mL OMNIPAQUE IOHEXOL 350 MG/ML SOLN COMPARISON:  Chest x-ray yesterday.  Prior chest CT on 06/07/2010. FINDINGS: The pulmonary arteries are fairly well opacified and there is no evidence of pulmonary embolism. The thoracic aorta shows no evidence of aneurysmal disease. The heart is  moderately enlarged. Heavily calcified plaque is present throughout the coronary tree in a 3 vessel distribution, including the left main coronary artery. No pleural or pericardial fluid is identified. Lungs show pulmonary venous hypertensive changes and possible early interstitial edema. There is a well-circumscribed noncalcified nodule in the posterior subpleural right upper lobe measuring 6 x 8 mm. This was not present on the prior chest CT in 2011. No other pulmonary nodules are identified. No enlarged lymph nodes are seen. The esophagus contains some fluid and is tortuous. Visualized upper abdomen shows air throughout a nondilated biliary tree consistent with prior sphincterotomy and reflux of air. Bony structures show spondylosis of the thoracic spine. Review of the MIP images confirms the above findings. IMPRESSION: 1. No evidence of pulmonary embolism. 2. Pulmonary venous hypertensive changes and possible early interstitial edema. 3. 6 x 8 mm posterior right upper lobe nodule. This appears new since 2011. If the patient is at high risk for bronchogenic carcinoma, follow-up chest CT at 3-6 months is recommended. If the patient is at low risk for bronchogenic carcinoma, follow-up chest CT at 6-12  months is recommended. This recommendation follows the consensus statement: Guidelines for Management of Small Pulmonary Nodules Detected on CT Scans: A Statement from the Fleischner Society as published in Radiology 2005; 237:395-400. 4. Coronary artery disease with heavily calcified coronary arteries in a 3 vessel distribution. Electronically Signed   By: Aletta Edouard M.D.   On: 08/10/2015 09:35   Dg Knee Right Port  08/02/2015  CLINICAL DATA:  Right total knee arthroplasty. EXAM: PORTABLE RIGHT KNEE - 1-2 VIEW COMPARISON:  01/04/2015 FINDINGS: Right total knee arthroplasty identified. There is no evidence of subluxation or dislocation. Soft tissue postoperative changes are present. No complicating features are noted. IMPRESSION: Right total knee arthroplasty and postoperative changes. No definite complicating features. Electronically Signed   By: Margarette Canada M.D.   On: 08/02/2015 14:35    Micro Results     No results found for this or any previous visit (from the past 240 hour(s)).     Today   Subjective:   Kshaun Gargan today has no headache,no chest abdominal pain,no new weakness tingling or numbness, feels much better  today.  Objective:   Blood pressure 115/53, pulse 84, temperature 99.3 F (37.4 C), temperature source Oral, resp. rate 18, height 6' (1.829 m), weight 104.6 kg (230 lb 9.6 oz), SpO2 94 %.   Intake/Output Summary (Last 24 hours) at 08/10/15 1502 Last data filed at 08/10/15 1230  Gross per 24 hour  Intake    480 ml  Output   2500 ml  Net  -2020 ml    Exam Awake Alert, Oriented, comfortable, Normal affect [place].AT,PERRAL Supple Neck,No JVD, No cervical lymphadenopathy appriciated.  Symmetrical Chest wall movement, Good air movement bilaterally, RRR,No Gallops,Rubs or new Murmurs, No Parasternal Heave +ve B.Sounds, Abd Soft, Non tender, No organomegaly appriciated,  No Cyanosis, Clubbing or edema,   Data Review   CBC w Diff: Lab Results  Component Value  Date   WBC 8.9 08/09/2015   HGB 10.5* 08/09/2015   HCT 31.0* 08/09/2015   PLT 302 08/09/2015   LYMPHOPCT 25 04/18/2015   MONOPCT 7 04/18/2015   EOSPCT 2 04/18/2015   BASOPCT 1 04/18/2015    CMP: Lab Results  Component Value Date   NA 138 08/10/2015   K 5.3* 08/10/2015   CL 98* 08/10/2015   CO2 24 08/10/2015   BUN 70* 08/10/2015   CREATININE 11.02* 08/10/2015  PROT 7.2 08/10/2015   ALBUMIN 2.8* 08/10/2015   BILITOT 1.1 08/10/2015   ALKPHOS 79 08/10/2015   AST 42* 08/10/2015   ALT 33 08/10/2015  .   Total Time in preparing paper work, data evaluation and todays exam - 35 minutes  Shela Esses M.D on 08/10/2015 at 3:02 PM  [facility]   Office  [phone]

## 2015-08-10 NOTE — Evaluation (Signed)
Physical Therapy Evaluation Patient Details Name: [name] MRN: [id number] DOB: 11/13/1950 Today's Date: 08/10/2015   History of Present Illness  Patient is a 64 y/o male who presents from [facility] with AMS and CP- Suspected medication induced from narcotic therapy. PMH includes R TKA 11/1, gout, diabetes mellitus type 2, HTN, and HLD, CAD, ESRD on HD, dementia, dizziness, GI bleed.   Clinical Impression  Patient presents with lethargy and cognitive deficits impacting mobility assessment and evaluation. Not sure if this is result from narcotic overdose? Difficult to know pt's PLOF/history as pt with hx of dementia and pt not able to answer questions due to current mental state. Pt not able to actively participate in there ex or activity today despite increased time, max multimodal cues. Falling asleep sitting EOB without stimulus. Increased time to perform all mobility. Delayed processing noted. Pt with difficulty focusing on tasks and not able to stay engaged despite continuous repetition and stimulus. Tolerated sitting EOB with Min guard assist. Placed pt on CPM. Would benefit from [facility] to maximize independence and mobility. Will follow acutely to continue to assess functional mobility.     Follow Up Recommendations SNF;Supervision/Assistance - 24 hour    Equipment Recommendations  None recommended by PT    Recommendations for Other Services OT consult     Precautions / Restrictions Precautions Precautions: Knee;Fall Precaution Booklet Issued: No Precaution Comments: pt tends to externally rotate right hip, reviewed proper positioning making sure that knee was truly in extension with toes pointing upward (towel on R lateral thigh to keep from laterally rotating) Required Braces or Orthoses: Knee Immobilizer - Right Knee Immobilizer - Right: On when out of bed or walking Restrictions Weight Bearing Restrictions: Yes RLE Weight Bearing: Weight bearing as tolerated       Mobility  Bed Mobility Overal bed mobility: Needs Assistance Bed Mobility: Supine to Sit;Sit to Supine     Supine to sit: Min assist;HOB elevated Sit to supine: Min assist;HOB elevated   General bed mobility comments: Requires multiple manual and verbal cues to perform task. Assist with mobilizing RLE into/out of bed. Increased time. Decreased initiation and sequencing of movements.   Transfers Overall transfer level:  (Sat EOB for ~8 minutes however pt not able to initiate movement to standing despite max manual/verbal cues. "just wait," and pt would fall asleep.)                  Ambulation/Gait                Stairs            Wheelchair Mobility    Modified Rankin (Stroke Patients Only)       Balance Overall balance assessment: Needs assistance Sitting-balance support: Feet supported;Bilateral upper extremity supported Sitting balance-Leahy Scale: Fair Sitting balance - Comments: CLose Min guard as pt lethargic, falling asleep when not stimulated.                                      Pertinent Vitals/Pain Pain Assessment: Faces Faces Pain Scale: Hurts a little bit Pain Location: right knee during positioning. Pain Descriptors / Indicators: Grimacing Pain Intervention(s): Monitored during session;Repositioned    Home Living Family/patient expects to be discharged to:: Skilled nursing facility                      Prior Function Level of  Independence: Needs assistance         Comments: Pt not able to answer any questions regarding PLOF/time at [facility]. Not sure if pt has been ambulatory. per MD notes, pt has been lethargic and having difficulty participating in therapy at SNF.     Hand Dominance        Extremity/Trunk Assessment   Upper Extremity Assessment: Defer to OT evaluation           Lower Extremity Assessment: Difficult to assess due to impaired cognition;Generalized weakness (Pt not  actively following commands to participate in MMT or functional activity. )         Communication      Cognition Arousal/Alertness: Lethargic Behavior During Therapy: Flat affect Overall Cognitive Status: Impaired/Different from baseline Area of Impairment: Following commands;Problem solving;Orientation Orientation Level: Disoriented to;Place;Situation;Time (not answering questions about date. [facility]     Following Commands: Follows one step commands inconsistently;Follows one step commands with increased time     Problem Solving: Slow processing;Decreased initiation;Requires verbal cues;Requires tactile cues;Difficulty sequencing General Comments: Pt "gazing out" multiple times during session and requires continuous stimulus to stay focused and awake however pt not able to stay engaged in conversation or answer questions at all.    General Comments      Exercises Total Joint Exercises Ankle Circles/Pumps: PROM;Both;15 reps;Supine Quad Sets: PROM;Right;10 reps;Supine (not attempting to perform actively.) Heel Slides: PROM;Right;10 reps;Supine Hip ABduction/ADduction: PROM;Right;10 reps;Supine      Assessment/Plan    PT Assessment Patient needs continued PT services  PT Diagnosis Generalized weakness;Altered mental status   PT Problem List Decreased strength;Decreased cognition;Decreased range of motion;Pain;Decreased balance;Decreased mobility;Decreased activity tolerance  PT Treatment Interventions Balance training;Functional mobility training;Therapeutic activities;Therapeutic exercise;Patient/family education;Gait training   PT Goals (Current goals can be found in the Care Plan section) Acute Rehab PT Goals Patient Stated Goal: none stated PT Goal Formulation: Patient unable to participate in goal setting Time For Goal Achievement: 08/24/15 Potential to Achieve Goals: Fair    Frequency Min 4X/week   Barriers to discharge         Co-evaluation               End of Session Equipment Utilized During Treatment: Gait belt Activity Tolerance: Patient limited by lethargy;Treatment limited secondary to medical complications (Comment) (inability to follow commands/actively participate in therapy.) Patient left: in bed;with call bell/phone within reach;with bed alarm set;in CPM Nurse Communication: Mobility status;Need for lift equipment    Functional Assessment Tool Used: CLinical judgment Functional Limitation: Mobility: Walking and moving around Mobility: Walking and Moving Around Current Status [phone]): At least 20 percent but less than 40 percent impaired, limited or restricted Mobility: Walking and Moving Around Goal Status [phone]): At least 20 percent but less than 40 percent impaired, limited or restricted    Time: 0947-1015 PT Time Calculation (min) (ACUTE ONLY): 28 min   Charges:   PT Evaluation $Initial PT Evaluation Tier I: 1 Procedure PT Treatments $Therapeutic Activity: 8-22 mins   PT G Codes:   PT G-Codes **NOT FOR INPATIENT CLASS** Functional Assessment Tool Used: CLinical judgment Functional Limitation: Mobility: Walking and moving around Mobility: Walking and Moving Around Current Status [id number]): At least 20 percent but less than 40 percent impaired, limited or restricted Mobility: Walking and Moving Around Goal Status [phone]): At least 20 percent but less than 40 percent impaired, limited or restricted    [place] 08/10/2015, 12:14 PM Wray Kearns, [place], DPT [phone]

## 2015-08-10 NOTE — Progress Notes (Signed)
Report called to [facility]. All questions answered. Prescription and AVS sent with transport team. All questions answered. They will call me if questions arise.  Fritz Pickerel, RN

## 2015-08-10 NOTE — Discharge Instructions (Signed)
Follow with Primary MD [name], MD after discharge from SNF.  Get CBC, CMP, 2 view Chest X ray checked  by Primary MD next visit.    Activity: As tolerated with Full fall precautions use walker/cane & assistance as needed, weightbearing as tolerated on right lower extremity.   Disposition SNF   Diet: Heart Healthy , RENAL modified, with feeding assistance and aspiration precautions.  For Heart failure patients - Check your Weight same time everyday, if you gain over 2 pounds, or you develop in leg swelling, experience more shortness of breath or chest pain, call your Primary MD immediately. Follow Cardiac Low Salt Diet and 1.5 lit/day fluid restriction.   On your next visit with your primary care physician please Get Medicines reviewed and adjusted.   Please request your Prim.MD to go over all Hospital Tests and Procedure/Radiological results at the follow up, please get all Hospital records sent to your Prim MD by signing hospital release before you go home.   If you experience worsening of your admission symptoms, develop shortness of breath, life threatening emergency, suicidal or homicidal thoughts you must seek medical attention immediately by calling 911 or calling your MD immediately  if symptoms less severe.  You Must read complete instructions/literature along with all the possible adverse reactions/side effects for all the Medicines you take and that have been prescribed to you. Take any new Medicines after you have completely understood and accpet all the possible adverse reactions/side effects.   Do not drive, operating heavy machinery, perform activities at heights, swimming or participation in water activities or provide baby sitting services if your were admitted for syncope or siezures until you have seen by Primary MD or a Neurologist and advised to do so again.  Do not drive when taking Pain medications.    Do not take more than prescribed Pain, Sleep and Anxiety  Medications  Special Instructions: If you have smoked or chewed Tobacco  in the last 2 yrs please stop smoking, stop any regular Alcohol  and or any Recreational drug use.  Wear Seat belts while driving.   Please note  You were cared for by a hospitalist during your hospital stay. If you have any questions about your discharge medications or the care you received while you were in the hospital after you are discharged, you can call the unit and asked to speak with the hospitalist on call if the hospitalist that took care of you is not available. Once you are discharged, your primary care physician will handle any further medical issues. Please note that NO REFILLS for any discharge medications will be authorized once you are discharged, as it is imperative that you return to your primary care physician (or establish a relationship with a primary care physician if you do not have one) for your aftercare needs so that they can reassess your need for medications and monitor your lab values.

## 2015-08-10 NOTE — Progress Notes (Signed)
This morning entered patient room to patient lying on his side with CPM sideways. Educated patient on importance of maintaining the CPM and positioning. Repositioned patient in the CPM machine.   Pt refusing all medications. Stating "they make me worse". Explained the medications he was to receive, their actions/purpose - pt still refusing medications. Will try again when family arrives this afternoon.  Fritz Pickerel, RN

## 2015-08-10 NOTE — Care Management Obs Status (Signed)
[facility] NOTIFICATION   Patient Details  Name: [name] MRN: [id number] Date of Birth: [birth year]   Medicare Observation Status Notification Given:  Other (see comment) (CM attempted to give/explain [facility] form to pt, pt is confused and able to comprehend.  CM contacted wife [name] and explained via phone, wife request CM bring paper when she visits approximately noon today)    Maryclare Labrador, RN 08/10/2015, 11:02 AM

## 2015-08-10 NOTE — Clinical Social Work Note (Signed)
Clinical Social Worker facilitated patient discharge including contacting patient family and facility to confirm patient discharge plans.  Clinical information faxed to facility and family agreeable with plan.  CSW arranged ambulance transport via [facility] to [facility] .  RN to call report prior to discharge.  Clinical Social Worker will sign off for now as social work intervention is no longer needed. Please consult us again if new need arises.  Jesse Baldomero Mirarchi, LCSW [phone] 

## 2015-08-10 NOTE — Progress Notes (Signed)
VASCULAR LAB PRELIMINARY  PRELIMINARY  PRELIMINARY  PRELIMINARY  Bilateral lower extremity venous duplex  completed.    Preliminary report:  Bilateral:  No evidence of DVT, superficial thrombosis, or Baker's Cyst.    Chene Kasinger, RVT 08/10/2015, 1:16 PM

## 2015-08-10 NOTE — Progress Notes (Signed)
Patient is more alert this morning.  Resting in bed with the CPM in place.  Encouraged the patient to try to tolerate using the machine several times today to help with his ROM.  PT is to see today and help with mobilization.  Will continue to use narcotics sparingly.  Please call with any questions or concerns.   Lovett Calender  PA-C  [phone]

## 2015-08-10 NOTE — Care Management Obs Status (Signed)
[facility] NOTIFICATION   Patient Details  Name: JORRYN HEADLEE MRN: [id number] Date of Birth: 1951/01/01   Medicare Observation Status Notification Given:  No (Pt wife refused to sign document; stated she wasnt going to sign dcoument that stated medications would not be covered  without having the option of bringing home meds.  CM documented on form refusal)    Maryclare Labrador, RN 08/10/2015, 3:01 PM

## 2015-08-11 DIAGNOSIS — N2581 Secondary hyperparathyroidism of renal origin: Secondary | ICD-10-CM | POA: Diagnosis not present

## 2015-08-11 DIAGNOSIS — E119 Type 2 diabetes mellitus without complications: Secondary | ICD-10-CM | POA: Diagnosis not present

## 2015-08-11 DIAGNOSIS — D631 Anemia in chronic kidney disease: Secondary | ICD-10-CM | POA: Diagnosis not present

## 2015-08-11 DIAGNOSIS — N186 End stage renal disease: Secondary | ICD-10-CM | POA: Diagnosis not present

## 2015-08-11 DIAGNOSIS — D509 Iron deficiency anemia, unspecified: Secondary | ICD-10-CM | POA: Diagnosis not present

## 2015-08-12 ENCOUNTER — Non-Acute Institutional Stay (SKILLED_NURSING_FACILITY): Payer: Medicare Other | Admitting: Internal Medicine

## 2015-08-12 ENCOUNTER — Encounter: Payer: Self-pay | Admitting: Internal Medicine

## 2015-08-12 DIAGNOSIS — I110 Hypertensive heart disease with heart failure: Secondary | ICD-10-CM

## 2015-08-12 DIAGNOSIS — G9340 Encephalopathy, unspecified: Secondary | ICD-10-CM

## 2015-08-12 DIAGNOSIS — E785 Hyperlipidemia, unspecified: Secondary | ICD-10-CM

## 2015-08-12 DIAGNOSIS — M109 Gout, unspecified: Secondary | ICD-10-CM | POA: Diagnosis not present

## 2015-08-12 DIAGNOSIS — E1142 Type 2 diabetes mellitus with diabetic polyneuropathy: Secondary | ICD-10-CM | POA: Diagnosis not present

## 2015-08-12 DIAGNOSIS — N186 End stage renal disease: Secondary | ICD-10-CM

## 2015-08-12 DIAGNOSIS — R0789 Other chest pain: Secondary | ICD-10-CM

## 2015-08-12 DIAGNOSIS — E1340 Other specified diabetes mellitus with diabetic neuropathy, unspecified: Secondary | ICD-10-CM | POA: Diagnosis not present

## 2015-08-12 DIAGNOSIS — Z992 Dependence on renal dialysis: Secondary | ICD-10-CM

## 2015-08-12 DIAGNOSIS — Z96651 Presence of right artificial knee joint: Secondary | ICD-10-CM

## 2015-08-12 NOTE — Progress Notes (Signed)
MRN: [id number] Name: [name]  Sex: male Age: 64 y.o. DOB: October 21, 1950  [place] #: [facility] Facility/Room:509 Level Of Care: SNF Provider: Inocencio Homes D Emergency Contacts: Extended Emergency Contact Information Primary Emergency Contact: Modesto Charon Address: [address]          [place], [place] [address] Johnnette Litter of [place] Phone: [phone] Mobile Phone: [phone] Relation: Spouse Secondary Emergency Contact: Lowe,Curtis          Altha Harm, [place] of [place] Phone: [phone] Work Phone: [phone] Mobile Phone: [phone] Relation: Son  Code Status:   Allergies: Cephalexin; Statins; and Ciprofloxacin  Chief Complaint  Patient presents with  . New Admit To SNF    HPI: Patient is 64 y.o. male with past medical history significant for ESRD on HD T/TH/Sat, gout, diabetes mellitus type 2, HTN, and HLD; who recently had had undergone a total knee replacement on 08/02/2015. Following the surgical procedure he had gone to [facility] facility and in the last day or so had become more altered and one day after PT pt c/o CP. Pt was admitted to [facility] from 11/7-9 where his CP w/u was negative and his MS change was felt to be from narcotic pain medications. Pt is admitted to SNF to continue PT/OT. While at SNF pt will be followed for gout,tx with allopurinol, HTN, tx with coreg and and HLD tx with zetia and colestipol.  Past Medical History  Diagnosis Date  . Hyperthyroidism   . GASTROENTERITIS, VIRAL 10/14/2009  . ONYCHOMYCOSIS, TOENAILS 12/26/2007  . GOITER, MULTINODULAR 12/26/2007  . HYPERTHYROIDISM 02/02/2010  . DIABETES MELLITUS, TYPE II 02/01/2010  . DYSLIPIDEMIA 06/18/2007  . GOUT 06/18/2007  . Hypocalcemia 06/07/2010  . DEPRESSION 10/14/2009  . PERIPHERAL NEUROPATHY 06/18/2007  . HYPERTENSION 06/18/2007  . CONGESTIVE HEART FAILURE 06/18/2007  . DIASTOLIC HEART FAILURE, CHRONIC 02/06/2009  . Unspecified hypotension 01/30/2010  .  PULMONARY NODULE, RIGHT LOWER LOBE 06/08/2009  . GERD 06/18/2007  . BENIGN PROSTATIC HYPERTROPHY 10/14/2009  . GYNECOMASTIA 07/17/2010  . NECK PAIN 07/31/2010  . FOOT PAIN 08/12/2008  . DIZZINESS 07/17/2010  . Other malaise and fatigue 11/24/2009  . GAIT DISTURBANCE 03/03/2010  . DYSPNEA 10/29/2008  . CHEST PAIN 03/29/2010  . TRANSAMINASES, SERUM, ELEVATED 02/01/2010  . COLONIC POLYPS, HX OF 10/14/2009  . Anemia 06/16/2011  . Ischemic cardiomyopathy 06/16/2011  . Hyperparathyroidism, secondary ([place]) 06/16/2011  . OSA on CPAP 10/16/2011  . Hyperlipidemia 10/16/2011  . CAD, NATIVE VESSEL 02/06/2009    saw Dr. Missy Sabins last jan 2013  . Sleep apnea     cpap machine and o2  . Allergic rhinitis, cause unspecified 02/24/2014  . Transfusion history     none recent  . Hemodialysis access, fistula mature [facility])     Dialysis T-Th-Sa ([place]) Right upper arm fistula  . ESRD (end stage renal disease) on dialysis ([place]) 08/04/2010    "TTS;  " (04/18/2015)  . Dementia [id number]  . Renal insufficiency     Past Surgical History  Procedure Laterality Date  . Coronary angioplasty with stent placement  06/11/2008  . Cervical spine surgery  2/09    "to repair nerve problems in my left arm"  . Back surgery  1998  . Esophagogastroduodenoscopy  09/28/2011    Procedure: ESOPHAGOGASTRODUODENOSCOPY (EGD);  Surgeon: Missy Sabins, MD;  Location: [facility] ENDOSCOPY;  Service: Endoscopy;  Laterality: N/A;  . Givens capsule study  09/30/2011    Procedure: GIVENS CAPSULE STUDY;  Surgeon: Altamese Dilling  Drema Balzarine, MD;  Location: [place] ENDOSCOPY;  Service: Endoscopy;  Laterality: N/A;  . Cholecystectomy    . Coronary angioplasty with stent placement  06/2007    TAXUS stent to RCA/notes 01/31/2011  . Tonsillectomy    . Av fistula placement  11/07/2011    Procedure: INSERTION OF ARTERIOVENOUS (AV) GORE-TEX GRAFT ARM;  Surgeon: Tinnie Gens, MD;  Location: [place];  Service: Vascular;  Laterality: Left;  . Arteriovenous graft placement  Right 2009    forearm/notes 02/01/2011  . Insertion of dialysis catheter Right 2014  . Insertion of dialysis catheter Left 02/11/2013    Procedure: INSERTION OF DIALYSIS CATHETER;  Surgeon: Conrad [place], MD;  Location: [place];  Service: Vascular;  Laterality: Left;  Ultrasound guided  . Removal of a dialysis catheter Right 02/11/2013    Procedure: REMOVAL OF A DIALYSIS CATHETER;  Surgeon: Conrad [place], MD;  Location: [place];  Service: Vascular;  Laterality: Right;  . Bascilic vein transposition Right 02/27/2013    Procedure: BASCILIC VEIN TRANSPOSITION;  Surgeon: Mal Misty, MD;  Location: [place];  Service: Vascular;  Laterality: Right;  Right Basilic Vein Transposition   . Shuntogram N/A 09/20/2011    Procedure: Earney Mallet;  Surgeon: Conrad Amherst, MD;  Location: [facility] CATH LAB;  Service: Cardiovascular;  Laterality: N/A;  . Venogram N/A 01/26/2013    Procedure: VENOGRAM;  Surgeon: Angelia Mould, MD;  Location: [facility] CATH LAB;  Service: Cardiovascular;  Laterality: N/A;  . Esophagogastroduodenoscopy N/A 04/07/2015    Procedure: ESOPHAGOGASTRODUODENOSCOPY (EGD);  Surgeon: Teena Irani, MD;  Location: [facility];  Service: Endoscopy;  Laterality: N/A;  . Savory dilation N/A 04/07/2015    Procedure: SAVORY DILATION;  Surgeon: Teena Irani, MD;  Location: [facility] ENDOSCOPY;  Service: Endoscopy;  Laterality: N/A;  . Foreign body removal  09/2003    via upper endoscopy/notes 02/12/2011  . Esophagogastroduodenoscopy N/A 04/19/2015    Procedure: ESOPHAGOGASTRODUODENOSCOPY (EGD);  Surgeon: Arta Silence, MD;  Location: [facility] ENDOSCOPY;  Service: Endoscopy;  Laterality: N/A;  . Total knee arthroplasty Right 08/02/2015    Procedure: TOTAL KNEE ARTHROPLASTY;  Surgeon: Renette Butters, MD;  Location: [place];  Service: Orthopedics;  Laterality: Right;      Medication List       This list is accurate as of: 08/12/15 11:59 PM.  Always use your most recent med list.               acetaminophen 325 MG tablet   Commonly known as:  TYLENOL  Take 1 tablet (325 mg total) by mouth every 4 (four) hours as needed for headache or mild pain.     allopurinol 100 MG tablet  Commonly known as:  ZYLOPRIM  Take 1 tablet (100 mg total) by mouth daily.     calcium-vitamin D 500-200 MG-UNIT tablet  Commonly known as:  OSCAL WITH D  Take 3 tablets by mouth 3 (three) times daily with meals.     carvedilol 12.5 MG tablet  Commonly known as:  COREG  Take 12.5 mg by mouth 2 (two) times daily.     cinacalcet 60 MG tablet  Commonly known as:  SENSIPAR  Take 60 mg by mouth 2 (two) times daily.     colesevelam 625 MG tablet  Commonly known as:  WELCHOL  Take 3 tablets (1,875 mg total) by mouth 2 (two) times daily with a meal.     colestipol 1 G tablet  Commonly known as:  COLESTID  Take 1 g  by mouth 2 (two) times daily.     DEXILANT 60 MG capsule  Generic drug:  dexlansoprazole  Take 1 tablet by mouth daily as needed (acid reflux).     docusate sodium 100 MG capsule  Commonly known as:  COLACE  Take 1 capsule (100 mg total) by mouth 2 (two) times daily.     enoxaparin 100 MG/ML injection  Commonly known as:  LOVENOX  Inject 0.3 mLs (30 mg total) into the skin daily. For total of 30 day for DVT prophylaxis,  it can't be stopped if patient is fully ambulatory prior to 30 DAYS.     ezetimibe 10 MG tablet  Commonly known as:  ZETIA  Take 1 tablet (10 mg total) by mouth daily.     HYDROcodone-acetaminophen 5-325 MG tablet  Commonly known as:  NORCO/VICODIN  Take 1 tablet by mouth every 6 (six) hours as needed for severe pain.     lanthanum 1000 MG chewable tablet  Commonly known as:  FOSRENOL  Chew 1,000 mg by mouth 2 times daily at 12 noon and 4 pm.     multivitamin Tabs tablet  Take 1 tablet by mouth daily.     nateglinide 60 MG tablet  Commonly known as:  STARLIX  Take 1 tablet (60 mg total) by mouth 3 (three) times daily with meals.     ondansetron 4 MG tablet  Commonly known as:  ZOFRAN   Take 1 tablet (4 mg total) by mouth every 8 (eight) hours as needed for nausea or vomiting.        No orders of the defined types were placed in this encounter.    Immunization History  Administered Date(s) Administered  . Influenza Split 07/16/2013  . Influenza Whole 08/12/2008  . Influenza,inj,Quad PF,36+ Mos 07/01/2014  . Influenza-Unspecified 07/04/2015  . Pneumococcal Conjugate-13 01/13/2014  . Pneumococcal Polysaccharide-23 08/31/2008    Social History  Substance Use Topics  . Smoking status: Former Smoker -- 1.00 packs/day for 25 years    Types: Cigarettes  . Smokeless tobacco: Former Systems developer    Quit date: 10/01/2005     Comment: Quit smoking 2007 Smoked x 25 years 1/2 ppd.  . Alcohol Use: 0.0 oz/week    0 Standard drinks or equivalent per week     Comment: occassional     Family history is + HD   Review of Systems  DATA OBTAINED: from patient, nurse- no c/o or concerns GENERAL:  no fevers, fatigue, appetite changes SKIN: No itching, rash or wounds EYES: No eye pain, redness, discharge EARS: No earache, tinnitus, change in hearing NOSE: No congestion, drainage or bleeding  MOUTH/THROAT: No mouth or tooth pain, No sore throat RESPIRATORY: No cough, wheezing, SOB CARDIAC: No chest pain, palpitations, lower extremity edema  GI: No abdominal pain, No N/V/D or constipation, No heartburn or reflux  GU: No dysuria, frequency or urgency, or incontinence  MUSCULOSKELETAL: No unrelieved bone/joint pain NEUROLOGIC: No headache, dizziness or focal weakness PSYCHIATRIC: No c/o anxiety or sadness   Filed Vitals:   08/12/15 1418  BP: 116/59  Pulse: 90  Temp: 98.1 F (36.7 C)  Resp: 18    SpO2 Readings from Last 1 Encounters:  08/10/15 94%        Physical Exam  GENERAL APPEARANCE: Alert, conversant,  No acute distress.  SKIN: No diaphoresis rash HEAD: Normocephalic, atraumatic  EYES: Conjunctiva/lids clear. Pupils round, reactive. EOMs intact.  EARS:  External exam WNL, canals clear. Hearing grossly normal.  NOSE: No  deformity or discharge.  MOUTH/THROAT: Lips w/o lesions  RESPIRATORY: Breathing is even, unlabored. Lung sounds are clear   CARDIOVASCULAR: Heart RRR no murmurs, rubs or gallops. No peripheral edema.   GASTROINTESTINAL: Abdomen is soft, non-tender, not distended w/ normal bowel sounds. GENITOURINARY: Bladder non tender, not distended  MUSCULOSKELETAL: No abnormal joints or musculature; R knee-unremarkable NEUROLOGIC:  Cranial nerves 2-12 grossly intact. Moves all extremities  PSYCHIATRIC: Mood and affect appropriate to situation, no behavioral issues  Patient Active Problem List   Diagnosis Date Noted  . Chest pain 08/09/2015  . Acute encephalopathy 08/09/2015  . Encephalopathy acute 08/09/2015  . S/P right total knee replacement 08/09/2015  . Primary osteoarthritis of right knee 08/05/2015  . Neuropathy due to secondary diabetes mellitus ([place]) 08/05/2015  . Arthritis of knee 08/02/2015  . Headache 06/08/2015  . Melena 04/19/2015  . Acute blood loss anemia 04/18/2015  . Dialysis patient ([place]) 04/18/2015  . Hyperthyroidism 03/11/2015  . Thigh pain 12/08/2014  . PSA elevation 08/30/2014  . Allergic rhinitis, cause unspecified 02/24/2014  . Elevated PSA 02/24/2014  . Vertigo 10/14/2013  . Numbness 05/15/2013  . ESRD on dialysis ([place]) 04/06/2013  . Chronic systolic heart failure ([place]) 02/09/2013  . Other complications due to renal dialysis device, implant, and graft 01/14/2013  . Right hand pain 03/28/2012  . Dysphagia 03/28/2012  . Hypogonadism, male 03/28/2012  . OSA (obstructive sleep apnea) 10/16/2011  . Hyperlipidemia 10/16/2011  . CAD (coronary artery disease) 09/28/2011  . NSTEMI (non-ST elevated myocardial infarction) ([place]) 09/28/2011  . AVM (arteriovenous malformation) 09/27/2011  . Anemia associated with acute blood loss 09/26/2011  . Anemia 06/16/2011  . Ischemic cardiomyopathy 06/16/2011  .  Hyperparathyroidism, secondary ([place]) 06/16/2011  . Preventative health care 03/11/2011  . NECK PAIN 07/31/2010  . GYNECOMASTIA 07/17/2010  . DIZZINESS 07/17/2010  . GAIT DISTURBANCE 03/03/2010  . Thyrotoxicosis 02/02/2010  . Type 2 diabetes mellitus with diabetic polyneuropathy ([place]) 02/01/2010  . TRANSAMINASES, SERUM, ELEVATED 02/01/2010  . Other malaise and fatigue 11/24/2009  . DEPRESSION 10/14/2009  . BENIGN PROSTATIC HYPERTROPHY 10/14/2009  . COLONIC POLYPS, HX OF 10/14/2009  . Dementia 09/02/2009  . PULMONARY NODULE, RIGHT LOWER LOBE 06/08/2009  . DIASTOLIC HEART FAILURE, CHRONIC 02/06/2009  . DYSPNEA 10/29/2008  . FOOT PAIN 08/12/2008  . ONYCHOMYCOSIS, TOENAILS 12/26/2007  . GOITER, MULTINODULAR 12/26/2007  . GOUT 06/18/2007  . PERIPHERAL NEUROPATHY 06/18/2007  . Hypertensive heart disease with CHF (congestive heart failure) ([place]) 06/18/2007  . GERD 06/18/2007    CBC    Component Value Date/Time   WBC 8.9 08/09/2015 0045   RBC 3.39* 08/09/2015 0045   HGB 10.5* 08/09/2015 0116   HCT 31.0* 08/09/2015 0116   PLT 302 08/09/2015 0045   MCV 88.5 08/09/2015 0045   LYMPHSABS 2.0 04/18/2015 1404   MONOABS 0.6 04/18/2015 1404   EOSABS 0.2 04/18/2015 1404   BASOSABS 0.1 04/18/2015 1404    CMP     Component Value Date/Time   NA 138 08/10/2015 0405   K 5.3* 08/10/2015 0405   CL 98* 08/10/2015 0405   CO2 24 08/10/2015 0405   GLUCOSE 123* 08/10/2015 0405   BUN 70* 08/10/2015 0405   CREATININE 11.02* 08/10/2015 0405   CALCIUM 10.0 08/10/2015 0405   PROT 7.2 08/10/2015 0405   ALBUMIN 2.8* 08/10/2015 0405   AST 42* 08/10/2015 0405   ALT 33 08/10/2015 0405   ALKPHOS 79 08/10/2015 0405   BILITOT 1.1 08/10/2015 0405   GFRNONAA 4* 08/10/2015 0405   GFRAA 5*  08/10/2015 0405    Lab Results  Component Value Date   HGBA1C 6.1* 07/20/2015     Dg Chest 2 View  08/08/2015  CLINICAL DATA:  Sudden onset of substernal chest pain tonight and shortness of breath. Recent knee  surgery. EXAM: CHEST  2 VIEW COMPARISON:  06/26/2013 FINDINGS: The heart is borderline enlarged but stable. The mediastinal and hilar contours are within normal limits and unchanged. Mild tortuosity of the thoracic aorta. The lungs demonstrate streaky scarring changes but no infiltrates, edema or effusions. The bony thorax is intact. IMPRESSION: Chronic basilar scarring changes but no acute pulmonary findings. Electronically Signed   By: Marijo Sanes M.D.   On: 08/08/2015 21:11    Not all labs, radiology exams or other studies done during hospitalization come through on my EPIC note; however they are reviewed by me.    Assessment and Plan  Acute encephalopathy Patient acutely altered beyond baseline on admission.  - This is most likely related to pain medication , was Held during hospital stay with significant improvement of mental status . Will discharge or Norco one tablet every 6 hours as needed. - Afebrile, no leukocytosis  Chest pain Patient had complaints of chest pain CTA chest done with no evidence of PE, Bilateral venous Dopplers were done with no evidence of DVT. - Troponins negative time 3  - no recurrence during hospital stay  S/P right total knee replacement SNF - cont OT/PT; cont lovenox 30 days or until pt OOB and very active  ESRD on dialysis ([place]) SNF - stable; cont T,TH,Sat dialysis  GOUT SNF - stable, cont allopurinol  Hyperlipidemia SNF - pt is statin intolerant; plan - cont colestipol and zetia, no recent lipid info  Type 2 diabetes mellitus with diabetic polyneuropathy ([name]) SNF - well controlled on Starlix; A1c was 6.1, pt statin intol, not on ACE- ESRD on dialysis  Neuropathy due to secondary diabetes mellitus ([name]) SNF - pt's neurontin was d/c in hospital and pt's wife is furious, he's in pain, etc; OK to restart neurontin 300 mg qHS, pt was on it at home  Hypertensive heart disease with CHF (congestive heart failure) ([place]) SNF - pt's norvasc was  d/c in hospital;will cont coreg and monitor BP, controlled at this time   Time spent 45 min;> 50% of time with patient was spent reviewing records, labs, tests and studies, counseling and developing plan of care  Hennie Duos, MD

## 2015-08-13 DIAGNOSIS — D509 Iron deficiency anemia, unspecified: Secondary | ICD-10-CM | POA: Diagnosis not present

## 2015-08-13 DIAGNOSIS — N2581 Secondary hyperparathyroidism of renal origin: Secondary | ICD-10-CM | POA: Diagnosis not present

## 2015-08-13 DIAGNOSIS — N186 End stage renal disease: Secondary | ICD-10-CM | POA: Diagnosis not present

## 2015-08-13 DIAGNOSIS — E119 Type 2 diabetes mellitus without complications: Secondary | ICD-10-CM | POA: Diagnosis not present

## 2015-08-13 DIAGNOSIS — D631 Anemia in chronic kidney disease: Secondary | ICD-10-CM | POA: Diagnosis not present

## 2015-08-13 NOTE — Assessment & Plan Note (Signed)
SNF - pt's norvasc was d/c in hospital;will cont coreg and monitor BP, controlled at this time

## 2015-08-13 NOTE — Assessment & Plan Note (Signed)
SNF - stable; cont T,TH,Sat dialysis

## 2015-08-13 NOTE — Assessment & Plan Note (Addendum)
SNF - cont OT/PT; cont lovenox 30 days or until pt OOB and very active

## 2015-08-13 NOTE — Assessment & Plan Note (Signed)
Patient had complaints of chest pain CTA chest done with no evidence of PE, Bilateral venous Dopplers were done with no evidence of DVT. - Troponins negative time 3  - no recurrence during hospital stay

## 2015-08-13 NOTE — Assessment & Plan Note (Addendum)
SNF - well controlled on Starlix; A1c was 6.1, pt statin intol, not on ACE- ESRD on dialysis

## 2015-08-13 NOTE — Assessment & Plan Note (Signed)
SNF - pt is statin intolerant; plan - cont colestipol and zetia, no recent lipid info

## 2015-08-13 NOTE — Assessment & Plan Note (Signed)
SNF - pt's neurontin was d/c in hospital and pt's wife is furious, he's in pain, etc; OK to restart neurontin 300 mg qHS, pt was on it at home

## 2015-08-13 NOTE — Assessment & Plan Note (Signed)
Patient acutely altered beyond baseline on admission.  - This is most likely related to pain medication , was Held during hospital stay with significant improvement of mental status . Will discharge or Norco one tablet every 6 hours as needed. - Afebrile, no leukocytosis

## 2015-08-13 NOTE — Assessment & Plan Note (Signed)
SNF - stable, cont allopurinol

## 2015-08-16 DIAGNOSIS — N186 End stage renal disease: Secondary | ICD-10-CM | POA: Diagnosis not present

## 2015-08-16 DIAGNOSIS — N2581 Secondary hyperparathyroidism of renal origin: Secondary | ICD-10-CM | POA: Diagnosis not present

## 2015-08-16 DIAGNOSIS — D631 Anemia in chronic kidney disease: Secondary | ICD-10-CM | POA: Diagnosis not present

## 2015-08-16 DIAGNOSIS — E119 Type 2 diabetes mellitus without complications: Secondary | ICD-10-CM | POA: Diagnosis not present

## 2015-08-16 DIAGNOSIS — D509 Iron deficiency anemia, unspecified: Secondary | ICD-10-CM | POA: Diagnosis not present

## 2015-08-17 DIAGNOSIS — M1711 Unilateral primary osteoarthritis, right knee: Secondary | ICD-10-CM | POA: Diagnosis not present

## 2015-08-18 DIAGNOSIS — D631 Anemia in chronic kidney disease: Secondary | ICD-10-CM | POA: Diagnosis not present

## 2015-08-18 DIAGNOSIS — E119 Type 2 diabetes mellitus without complications: Secondary | ICD-10-CM | POA: Diagnosis not present

## 2015-08-18 DIAGNOSIS — N2581 Secondary hyperparathyroidism of renal origin: Secondary | ICD-10-CM | POA: Diagnosis not present

## 2015-08-18 DIAGNOSIS — N186 End stage renal disease: Secondary | ICD-10-CM | POA: Diagnosis not present

## 2015-08-18 DIAGNOSIS — D509 Iron deficiency anemia, unspecified: Secondary | ICD-10-CM | POA: Diagnosis not present

## 2015-08-20 DIAGNOSIS — D509 Iron deficiency anemia, unspecified: Secondary | ICD-10-CM | POA: Diagnosis not present

## 2015-08-20 DIAGNOSIS — N186 End stage renal disease: Secondary | ICD-10-CM | POA: Diagnosis not present

## 2015-08-20 DIAGNOSIS — E119 Type 2 diabetes mellitus without complications: Secondary | ICD-10-CM | POA: Diagnosis not present

## 2015-08-20 DIAGNOSIS — N2581 Secondary hyperparathyroidism of renal origin: Secondary | ICD-10-CM | POA: Diagnosis not present

## 2015-08-20 DIAGNOSIS — D631 Anemia in chronic kidney disease: Secondary | ICD-10-CM | POA: Diagnosis not present

## 2015-08-22 ENCOUNTER — Telehealth: Payer: Self-pay | Admitting: Internal Medicine

## 2015-08-22 NOTE — Telephone Encounter (Signed)
Called and spoke with pt's wife. She stated Mr Weinheimer is Dr Cordelia Pen pt.

## 2015-08-22 NOTE — Telephone Encounter (Signed)
This AM it was 76 after lunch today it shot to 321 he had a [place] sandwich and bottle of water. Does not have all the specifics but has been very high

## 2015-08-22 NOTE — Telephone Encounter (Signed)
I contacted the pt's wife and she stated the note below was not given by her or [name]. [name] is currently in a rehab facility due to knee surgery.

## 2015-08-23 DIAGNOSIS — D509 Iron deficiency anemia, unspecified: Secondary | ICD-10-CM | POA: Diagnosis not present

## 2015-08-23 DIAGNOSIS — D631 Anemia in chronic kidney disease: Secondary | ICD-10-CM | POA: Diagnosis not present

## 2015-08-23 DIAGNOSIS — N2581 Secondary hyperparathyroidism of renal origin: Secondary | ICD-10-CM | POA: Diagnosis not present

## 2015-08-23 DIAGNOSIS — N186 End stage renal disease: Secondary | ICD-10-CM | POA: Diagnosis not present

## 2015-08-23 DIAGNOSIS — E119 Type 2 diabetes mellitus without complications: Secondary | ICD-10-CM | POA: Diagnosis not present

## 2015-08-26 DIAGNOSIS — E119 Type 2 diabetes mellitus without complications: Secondary | ICD-10-CM | POA: Diagnosis not present

## 2015-08-26 DIAGNOSIS — N186 End stage renal disease: Secondary | ICD-10-CM | POA: Diagnosis not present

## 2015-08-26 DIAGNOSIS — D631 Anemia in chronic kidney disease: Secondary | ICD-10-CM | POA: Diagnosis not present

## 2015-08-26 DIAGNOSIS — D509 Iron deficiency anemia, unspecified: Secondary | ICD-10-CM | POA: Diagnosis not present

## 2015-08-26 DIAGNOSIS — N2581 Secondary hyperparathyroidism of renal origin: Secondary | ICD-10-CM | POA: Diagnosis not present

## 2015-08-28 DIAGNOSIS — E119 Type 2 diabetes mellitus without complications: Secondary | ICD-10-CM | POA: Diagnosis not present

## 2015-08-28 DIAGNOSIS — N186 End stage renal disease: Secondary | ICD-10-CM | POA: Diagnosis not present

## 2015-08-28 DIAGNOSIS — D509 Iron deficiency anemia, unspecified: Secondary | ICD-10-CM | POA: Diagnosis not present

## 2015-08-28 DIAGNOSIS — D631 Anemia in chronic kidney disease: Secondary | ICD-10-CM | POA: Diagnosis not present

## 2015-08-28 DIAGNOSIS — N2581 Secondary hyperparathyroidism of renal origin: Secondary | ICD-10-CM | POA: Diagnosis not present

## 2015-08-30 DIAGNOSIS — E119 Type 2 diabetes mellitus without complications: Secondary | ICD-10-CM | POA: Diagnosis not present

## 2015-08-30 DIAGNOSIS — D509 Iron deficiency anemia, unspecified: Secondary | ICD-10-CM | POA: Diagnosis not present

## 2015-08-30 DIAGNOSIS — N2581 Secondary hyperparathyroidism of renal origin: Secondary | ICD-10-CM | POA: Diagnosis not present

## 2015-08-30 DIAGNOSIS — N186 End stage renal disease: Secondary | ICD-10-CM | POA: Diagnosis not present

## 2015-08-30 DIAGNOSIS — D631 Anemia in chronic kidney disease: Secondary | ICD-10-CM | POA: Diagnosis not present

## 2015-08-31 ENCOUNTER — Non-Acute Institutional Stay (SKILLED_NURSING_FACILITY): Payer: Medicare Other | Admitting: Internal Medicine

## 2015-08-31 DIAGNOSIS — M79604 Pain in right leg: Secondary | ICD-10-CM

## 2015-08-31 DIAGNOSIS — E1129 Type 2 diabetes mellitus with other diabetic kidney complication: Secondary | ICD-10-CM | POA: Diagnosis not present

## 2015-08-31 DIAGNOSIS — Z992 Dependence on renal dialysis: Secondary | ICD-10-CM | POA: Diagnosis not present

## 2015-08-31 DIAGNOSIS — N186 End stage renal disease: Secondary | ICD-10-CM | POA: Diagnosis not present

## 2015-08-31 NOTE — Progress Notes (Signed)
MRN: [id number] Name: [name]  Sex: male Age: 64 y.o. DOB: [birth year]  [place] #: [facility] Facility/Room:106 Level Of Care: SNF Provider: Inocencio Homes D Emergency Contacts: Extended Emergency Contact Information Primary Emergency Contact: Modesto Charon Address: [address]          [place], [place] [address] Johnnette Litter of [place] Phone: [phone] Mobile Phone: [phone] Relation: Spouse Secondary Emergency Contact: Lowe,Curtis          Altha Harm, [place] of [place] Phone: [phone] Work Phone: [phone] Mobile Phone: [phone] Relation: Son  Code Status:   Allergies: Cephalexin; Statins; and Ciprofloxacin  Chief Complaint  Patient presents with  . Acute Visit   HPI: Patient is 64 y.o. male with ESRD on dialysis, HTN, CHF, DM2, hyperthyroidism, HLD, gout who was admitted 11/1 -11/4 with a diagnosis of Primary osteoarthritis of right knee and went to the operating room on 08/02/2015 and underwent a total knee arthropathy. I am seeing pt who had continued c/o pain R knee for 2 days  per Nursing and PT. Pt admits pain in knee is not a lot better since the operation and hurts after rehab. He says they have been doing stretching during PT and c/o pain behind knee. No fever or fatigue or other systemic sx.  Past Medical History  Diagnosis Date  . Hyperthyroidism   . GASTROENTERITIS, VIRAL 10/14/2009  . ONYCHOMYCOSIS, TOENAILS 12/26/2007  . GOITER, MULTINODULAR 12/26/2007  . HYPERTHYROIDISM 02/02/2010  . DIABETES MELLITUS, TYPE II 02/01/2010  . DYSLIPIDEMIA 06/18/2007  . GOUT 06/18/2007  . Hypocalcemia 06/07/2010  . DEPRESSION 10/14/2009  . PERIPHERAL NEUROPATHY 06/18/2007  . HYPERTENSION 06/18/2007  . CONGESTIVE HEART FAILURE 06/18/2007  . DIASTOLIC HEART FAILURE, CHRONIC 02/06/2009  . Unspecified hypotension 01/30/2010  . PULMONARY NODULE, RIGHT LOWER LOBE 06/08/2009  . GERD 06/18/2007  . BENIGN PROSTATIC HYPERTROPHY 10/14/2009  . GYNECOMASTIA  07/17/2010  . NECK PAIN 07/31/2010  . FOOT PAIN 08/12/2008  . DIZZINESS 07/17/2010  . Other malaise and fatigue 11/24/2009  . GAIT DISTURBANCE 03/03/2010  . DYSPNEA 10/29/2008  . CHEST PAIN 03/29/2010  . TRANSAMINASES, SERUM, ELEVATED 02/01/2010  . COLONIC POLYPS, HX OF 10/14/2009  . Anemia 06/16/2011  . Ischemic cardiomyopathy 06/16/2011  . Hyperparathyroidism, secondary ([place]) 06/16/2011  . OSA on CPAP 10/16/2011  . Hyperlipidemia 10/16/2011  . CAD, NATIVE VESSEL 02/06/2009    saw Dr. Missy Sabins last jan 2013  . Sleep apnea     cpap machine and o2  . Allergic rhinitis, cause unspecified 02/24/2014  . Transfusion history     none recent  . Hemodialysis access, fistula mature [facility])     Dialysis T-Th-Sa ([place]) Right upper arm fistula  . ESRD (end stage renal disease) on dialysis ([place]) 08/04/2010    "TTS;  " (04/18/2015)  . Dementia [id number]  . Renal insufficiency     Past Surgical History  Procedure Laterality Date  . Coronary angioplasty with stent placement  06/11/2008  . Cervical spine surgery  2/09    "to repair nerve problems in my left arm"  . Back surgery  1998  . Esophagogastroduodenoscopy  09/28/2011    Procedure: ESOPHAGOGASTRODUODENOSCOPY (EGD);  Surgeon: Missy Sabins, MD;  Location: [facility] ENDOSCOPY;  Service: Endoscopy;  Laterality: N/A;  . Givens capsule study  09/30/2011    Procedure: GIVENS CAPSULE STUDY;  Surgeon: Jeryl Columbia, MD;  Location: [facility] ENDOSCOPY;  Service: Endoscopy;  Laterality: N/A;  . Cholecystectomy    . Coronary angioplasty with  stent placement  06/2007    TAXUS stent to RCA/notes 01/31/2011  . Tonsillectomy    . Av fistula placement  11/07/2011    Procedure: INSERTION OF ARTERIOVENOUS (AV) GORE-TEX GRAFT ARM;  Surgeon: Tinnie Gens, MD;  Location: [place];  Service: Vascular;  Laterality: Left;  . Arteriovenous graft placement Right 2009    forearm/notes 02/01/2011  . Insertion of dialysis catheter Right 2014  . Insertion of dialysis catheter  Left 02/11/2013    Procedure: INSERTION OF DIALYSIS CATHETER;  Surgeon: Conrad Melbourne, MD;  Location: [place];  Service: Vascular;  Laterality: Left;  Ultrasound guided  . Removal of a dialysis catheter Right 02/11/2013    Procedure: REMOVAL OF A DIALYSIS CATHETER;  Surgeon: Conrad North Weeki Wachee, MD;  Location: [place];  Service: Vascular;  Laterality: Right;  . Bascilic vein transposition Right 02/27/2013    Procedure: BASCILIC VEIN TRANSPOSITION;  Surgeon: Mal Misty, MD;  Location: [place];  Service: Vascular;  Laterality: Right;  Right Basilic Vein Transposition   . Shuntogram N/A 09/20/2011    Procedure: Earney Mallet;  Surgeon: Conrad Eaton, MD;  Location: [facility] CATH LAB;  Service: Cardiovascular;  Laterality: N/A;  . Venogram N/A 01/26/2013    Procedure: VENOGRAM;  Surgeon: Angelia Mould, MD;  Location: [facility] CATH LAB;  Service: Cardiovascular;  Laterality: N/A;  . Esophagogastroduodenoscopy N/A 04/07/2015    Procedure: ESOPHAGOGASTRODUODENOSCOPY (EGD);  Surgeon: Teena Irani, MD;  Location: [facility] ENDOSCOPY;  Service: Endoscopy;  Laterality: N/A;  . Savory dilation N/A 04/07/2015    Procedure: SAVORY DILATION;  Surgeon: Teena Irani, MD;  Location: [facility] ENDOSCOPY;  Service: Endoscopy;  Laterality: N/A;  . Foreign body removal  09/2003    via upper endoscopy/notes 02/12/2011  . Esophagogastroduodenoscopy N/A 04/19/2015    Procedure: ESOPHAGOGASTRODUODENOSCOPY (EGD);  Surgeon: Arta Silence, MD;  Location[facility] ENDOSCOPY;  Service: Endoscopy;  Laterality: N/A;  . Total knee arthroplasty Right 08/02/2015    Procedure: TOTAL KNEE ARTHROPLASTY;  Surgeon: Renette Butters, MD;  Location: [place];  Service: Orthopedics;  Laterality: Right;      Medication List       This list is accurate as of: 08/31/15 11:59 PM.  Always use your most recent med list.               acetaminophen 325 MG tablet  Commonly known as:  TYLENOL  Take 1 tablet (325 mg total) by mouth every 4 (four) hours as needed for headache or mild pain.      allopurinol 100 MG tablet  Commonly known as:  ZYLOPRIM  Take 1 tablet (100 mg total) by mouth daily.     calcium-vitamin D 500-200 MG-UNIT tablet  Commonly known as:  OSCAL WITH D  Take 3 tablets by mouth 3 (three) times daily with meals.     carvedilol 12.5 MG tablet  Commonly known as:  COREG  Take 12.5 mg by mouth 2 (two) times daily.     cinacalcet 60 MG tablet  Commonly known as:  SENSIPAR  Take 60 mg by mouth 2 (two) times daily.     colesevelam 625 MG tablet  Commonly known as:  WELCHOL  Take 3 tablets (1,875 mg total) by mouth 2 (two) times daily with a meal.     colestipol 1 G tablet  Commonly known as:  COLESTID  Take 1 g by mouth 2 (two) times daily.     DEXILANT 60 MG capsule  Generic drug:  dexlansoprazole  Take 1 tablet  by mouth daily as needed (acid reflux).     docusate sodium 100 MG capsule  Commonly known as:  COLACE  Take 1 capsule (100 mg total) by mouth 2 (two) times daily.     enoxaparin 100 MG/ML injection  Commonly known as:  LOVENOX  Inject 0.3 mLs (30 mg total) into the skin daily. For total of 30 day for DVT prophylaxis,  it can't be stopped if patient is fully ambulatory prior to 30 DAYS.     ezetimibe 10 MG tablet  Commonly known as:  ZETIA  Take 1 tablet (10 mg total) by mouth daily.     HYDROcodone-acetaminophen 5-325 MG tablet  Commonly known as:  NORCO/VICODIN  Take 1 tablet by mouth every 6 (six) hours as needed for severe pain.     lanthanum 1000 MG chewable tablet  Commonly known as:  FOSRENOL  Chew 1,000 mg by mouth 2 times daily at 12 noon and 4 pm.     multivitamin Tabs tablet  Take 1 tablet by mouth daily.     nateglinide 60 MG tablet  Commonly known as:  STARLIX  Take 1 tablet (60 mg total) by mouth 3 (three) times daily with meals.     ondansetron 4 MG tablet  Commonly known as:  ZOFRAN  Take 1 tablet (4 mg total) by mouth every 8 (eight) hours as needed for nausea or vomiting.        No orders of the  defined types were placed in this encounter.    Immunization History  Administered Date(s) Administered  . Influenza Split 07/16/2013  . Influenza Whole 08/12/2008  . Influenza,inj,Quad PF,36+ Mos 07/01/2014  . Influenza-Unspecified 07/04/2015  . Pneumococcal Conjugate-13 01/13/2014  . Pneumococcal Polysaccharide-23 08/31/2008    Social History  Substance Use Topics  . Smoking status: Former Smoker -- 1.00 packs/day for 25 years    Types: Cigarettes  . Smokeless tobacco: Former Systems developer    Quit date: 10/01/2005     Comment: Quit smoking 2007 Smoked x 25 years 1/2 ppd.  . Alcohol Use: 0.0 oz/week    0 Standard drinks or equivalent per week     Comment: occassional     Review of Systems  DATA OBTAINED: from patient, nurse- as per HPI GENERAL:  no fevers, fatigue, appetite changes SKIN: No itching, rash HEENT: No complaint RESPIRATORY: No cough, wheezing, SOB CARDIAC: No chest pain, palpitations, lower extremity edema  GI: No abdominal pain, No N/V/D or constipation, No heartburn or reflux  GU: No dysuria, frequency or urgency, or incontinence  MUSCULOSKELETAL:knee pain as per HPI NEUROLOGIC: No headache, dizziness  PSYCHIATRIC: No overt anxiety or sadness  Filed Vitals:   09/04/15 1144  BP: 128/64  Pulse: 60  Temp: 96.2 F (35.7 C)  Resp: 18    Physical Exam  GENERAL APPEARANCE: Alert, conversant, No acute distress  SKIN: No diaphoresis rash HEENT: Unremarkable RESPIRATORY: Breathing is even, unlabored. Lung sounds are clear   CARDIOVASCULAR: Heart RRR no murmurs, rubs or gallops. trace peripheral edema LLE GASTROINTESTINAL: Abdomen is soft, non-tender, not distended w/ normal bowel sounds.  GENITOURINARY: Bladder non tender, not distended  MUSCULOSKELETAL: R knee - mod swelling and warmth that might be expected a week after surgery but not a month, no post calf tender, TTP behind knee and distal post thigh,pt is in extension/flexion machine NEUROLOGIC: Cranial  nerves 2-12 grossly intact. Moves all extremities PSYCHIATRIC: Mood and affect appropriate to situation, no behavioral issues  Patient Active Problem List  Diagnosis Date Noted  . Right leg pain 09/04/2015  . Chest pain 08/09/2015  . Acute encephalopathy 08/09/2015  . Encephalopathy acute 08/09/2015  . S/P right total knee replacement 08/09/2015  . Primary osteoarthritis of right knee 08/05/2015  . Neuropathy due to secondary diabetes mellitus ([place]) 08/05/2015  . Arthritis of knee 08/02/2015  . Headache 06/08/2015  . Melena 04/19/2015  . Acute blood loss anemia 04/18/2015  . Dialysis patient ([place]) 04/18/2015  . Hyperthyroidism 03/11/2015  . Thigh pain 12/08/2014  . PSA elevation 08/30/2014  . Allergic rhinitis, cause unspecified 02/24/2014  . Elevated PSA 02/24/2014  . Vertigo 10/14/2013  . Numbness 05/15/2013  . ESRD on dialysis ([place]) 04/06/2013  . Chronic systolic heart failure ([place]) 02/09/2013  . Other complications due to renal dialysis device, implant, and graft 01/14/2013  . Right hand pain 03/28/2012  . Dysphagia 03/28/2012  . Hypogonadism, male 03/28/2012  . OSA (obstructive sleep apnea) 10/16/2011  . Hyperlipidemia 10/16/2011  . CAD (coronary artery disease) 09/28/2011  . NSTEMI (non-ST elevated myocardial infarction) ([place]) 09/28/2011  . AVM (arteriovenous malformation) 09/27/2011  . Anemia associated with acute blood loss 09/26/2011  . Anemia 06/16/2011  . Ischemic cardiomyopathy 06/16/2011  . Hyperparathyroidism, secondary ([place]) 06/16/2011  . Preventative health care 03/11/2011  . NECK PAIN 07/31/2010  . GYNECOMASTIA 07/17/2010  . DIZZINESS 07/17/2010  . GAIT DISTURBANCE 03/03/2010  . Thyrotoxicosis 02/02/2010  . Type 2 diabetes mellitus with diabetic polyneuropathy ([place]) 02/01/2010  . TRANSAMINASES, SERUM, ELEVATED 02/01/2010  . Other malaise and fatigue 11/24/2009  . DEPRESSION 10/14/2009  . BENIGN PROSTATIC HYPERTROPHY 10/14/2009  . COLONIC POLYPS, HX  OF 10/14/2009  . Dementia 09/02/2009  . PULMONARY NODULE, RIGHT LOWER LOBE 06/08/2009  . DIASTOLIC HEART FAILURE, CHRONIC 02/06/2009  . DYSPNEA 10/29/2008  . FOOT PAIN 08/12/2008  . ONYCHOMYCOSIS, TOENAILS 12/26/2007  . GOITER, MULTINODULAR 12/26/2007  . GOUT 06/18/2007  . PERIPHERAL NEUROPATHY 06/18/2007  . Hypertensive heart disease with CHF (congestive heart failure) ([place]) 06/18/2007  . GERD 06/18/2007    CBC    Component Value Date/Time   WBC 8.9 08/09/2015 0045   RBC 3.39* 08/09/2015 0045   HGB 10.5* 08/09/2015 0116   HCT 31.0* 08/09/2015 0116   PLT 302 08/09/2015 0045   MCV 88.5 08/09/2015 0045   LYMPHSABS 2.0 04/18/2015 1404   MONOABS 0.6 04/18/2015 1404   EOSABS 0.2 04/18/2015 1404   BASOSABS 0.1 04/18/2015 1404    CMP     Component Value Date/Time   NA 138 08/10/2015 0405   K 5.3* 08/10/2015 0405   CL 98* 08/10/2015 0405   CO2 24 08/10/2015 0405   GLUCOSE 123* 08/10/2015 0405   BUN 70* 08/10/2015 0405   CREATININE 11.02* 08/10/2015 0405   CALCIUM 10.0 08/10/2015 0405   PROT 7.2 08/10/2015 0405   ALBUMIN 2.8* 08/10/2015 0405   AST 42* 08/10/2015 0405   ALT 33 08/10/2015 0405   ALKPHOS 79 08/10/2015 0405   BILITOT 1.1 08/10/2015 0405   GFRNONAA 4* 08/10/2015 0405   GFRAA 5* 08/10/2015 0405    Assessment and Plan  Right leg pain Much more than would be expected for 30 days out of surgery; have ordered U/S to r/o DVT as source even though pt is on lovenox and motion machine while in bed and a CBC which if elevated will make me rethink possibility of infection in knee   Time spent > 35 min;> 50% of time with patient was spent reviewing records, labs, tests and studies, counseling and  developing plan of care  Hennie Duos, MD

## 2015-09-01 DIAGNOSIS — N2581 Secondary hyperparathyroidism of renal origin: Secondary | ICD-10-CM | POA: Diagnosis not present

## 2015-09-01 DIAGNOSIS — N186 End stage renal disease: Secondary | ICD-10-CM | POA: Diagnosis not present

## 2015-09-01 DIAGNOSIS — E119 Type 2 diabetes mellitus without complications: Secondary | ICD-10-CM | POA: Diagnosis not present

## 2015-09-01 DIAGNOSIS — D631 Anemia in chronic kidney disease: Secondary | ICD-10-CM | POA: Diagnosis not present

## 2015-09-01 DIAGNOSIS — D509 Iron deficiency anemia, unspecified: Secondary | ICD-10-CM | POA: Diagnosis not present

## 2015-09-03 DIAGNOSIS — N2581 Secondary hyperparathyroidism of renal origin: Secondary | ICD-10-CM | POA: Diagnosis not present

## 2015-09-03 DIAGNOSIS — D509 Iron deficiency anemia, unspecified: Secondary | ICD-10-CM | POA: Diagnosis not present

## 2015-09-03 DIAGNOSIS — N186 End stage renal disease: Secondary | ICD-10-CM | POA: Diagnosis not present

## 2015-09-03 DIAGNOSIS — D631 Anemia in chronic kidney disease: Secondary | ICD-10-CM | POA: Diagnosis not present

## 2015-09-03 DIAGNOSIS — E119 Type 2 diabetes mellitus without complications: Secondary | ICD-10-CM | POA: Diagnosis not present

## 2015-09-04 ENCOUNTER — Encounter: Payer: Self-pay | Admitting: Internal Medicine

## 2015-09-04 DIAGNOSIS — M79604 Pain in right leg: Secondary | ICD-10-CM | POA: Insufficient documentation

## 2015-09-04 NOTE — Assessment & Plan Note (Addendum)
Much more than would be expected for 30 days out of surgery; have ordered U/S to r/o DVT as source even though pt is on lovenox and motion machine while in bed and a CBC which if elevated will make me rethink possibility of infection in knee

## 2015-09-05 ENCOUNTER — Encounter: Payer: Self-pay | Admitting: Internal Medicine

## 2015-09-05 ENCOUNTER — Non-Acute Institutional Stay (SKILLED_NURSING_FACILITY): Payer: Medicare Other | Admitting: Internal Medicine

## 2015-09-05 DIAGNOSIS — Z96651 Presence of right artificial knee joint: Secondary | ICD-10-CM

## 2015-09-05 DIAGNOSIS — G9340 Encephalopathy, unspecified: Secondary | ICD-10-CM | POA: Diagnosis not present

## 2015-09-05 DIAGNOSIS — N186 End stage renal disease: Secondary | ICD-10-CM

## 2015-09-05 DIAGNOSIS — Z992 Dependence on renal dialysis: Secondary | ICD-10-CM

## 2015-09-05 DIAGNOSIS — I110 Hypertensive heart disease with heart failure: Secondary | ICD-10-CM

## 2015-09-05 DIAGNOSIS — E1340 Other specified diabetes mellitus with diabetic neuropathy, unspecified: Secondary | ICD-10-CM

## 2015-09-05 NOTE — Progress Notes (Signed)
Patient ID: PARVIN CRUZE, male   DOB: Nov 20, 1950, 64 y.o.   MRN: [id number] MRN: [id number] Name: FINNIS SWALLEY  Sex: male Age: 64 y.o. DOB: [birth year]  [facility] #: [facility] Facility/Room:106 Level Of Care: SNF Provider: Wille Celeste Emergency Contacts: Extended Emergency Contact Information Primary Emergency Contact: Modesto Charon Address: [address]          [place], [place] [address] Johnnette Litter of [place] Phone: [phone] Mobile Phone: [phone] Relation: Spouse Secondary Emergency Contact: Lowe,Curtis          Altha Harm, [place] of [place] Phone: [phone] Work Phone: [phone] Mobile Phone: [phone] Relation: Son  Code Status:   Allergies: Cephalexin; Statins; and Ciprofloxacin  Chief Complaint  Patient presents with  . Discharge Note   HPI: Patient is 64 y.o. male with ESRD on dialysis, HTN, CHF, DM2, hyperthyroidism, HLD, gout who was admitted 11/1 -11/4 with a diagnosis of Primary osteoarthritis of right knee and went to the operating room on 08/02/2015 and underwent a total knee arthropathy.  He is here essentially for rehabilitation.  This hospitalization was complicated by encephalopathy this was thought secondary to pain medication he was discharged on Norco one tablet every 6 hours and apparently this has stabilized there's been no reoccurrence.  He also complained of chest pain in the hospital CT of the chest showed no evidence of PE Dopplers were negative for DVT troponins were negative this apparently resolved during this hospitalization as well.  His stay here has been relatively unremarkable he will be going home he does have a supportive wife he will need continued PT and OT at home as well.  Currently he has no complaints.  Past Medical History  Diagnosis Date  . Hyperthyroidism   . GASTROENTERITIS, VIRAL 10/14/2009  . ONYCHOMYCOSIS, TOENAILS 12/26/2007  . GOITER, MULTINODULAR 12/26/2007  . HYPERTHYROIDISM 02/02/2010  .  DIABETES MELLITUS, TYPE II 02/01/2010  . DYSLIPIDEMIA 06/18/2007  . GOUT 06/18/2007  . Hypocalcemia 06/07/2010  . DEPRESSION 10/14/2009  . PERIPHERAL NEUROPATHY 06/18/2007  . HYPERTENSION 06/18/2007  . CONGESTIVE HEART FAILURE 06/18/2007  . DIASTOLIC HEART FAILURE, CHRONIC 02/06/2009  . Unspecified hypotension 01/30/2010  . PULMONARY NODULE, RIGHT LOWER LOBE 06/08/2009  . GERD 06/18/2007  . BENIGN PROSTATIC HYPERTROPHY 10/14/2009  . GYNECOMASTIA 07/17/2010  . NECK PAIN 07/31/2010  . FOOT PAIN 08/12/2008  . DIZZINESS 07/17/2010  . Other malaise and fatigue 11/24/2009  . GAIT DISTURBANCE 03/03/2010  . DYSPNEA 10/29/2008  . CHEST PAIN 03/29/2010  . TRANSAMINASES, SERUM, ELEVATED 02/01/2010  . COLONIC POLYPS, HX OF 10/14/2009  . Anemia 06/16/2011  . Ischemic cardiomyopathy 06/16/2011  . Hyperparathyroidism, secondary ([place]) 06/16/2011  . OSA on CPAP 10/16/2011  . Hyperlipidemia 10/16/2011  . CAD, NATIVE VESSEL 02/06/2009    saw Dr. Missy Sabins last jan 2013  . Sleep apnea     cpap machine and o2  . Allergic rhinitis, cause unspecified 02/24/2014  . Transfusion history     none recent  . Hemodialysis access, fistula [facility])     Dialysis T-Th-Sa ([place]) Right upper arm fistula  . ESRD (end stage renal disease) on dialysis ([place]) 08/04/2010    "TTS;  " (04/18/2015)  . Dementia [id number]  . Renal insufficiency     Past Surgical History  Procedure Laterality Date  . Coronary angioplasty with stent placement  06/11/2008  . Cervical spine surgery  2/09    "to repair nerve problems in my left arm"  . Back surgery  1998  . Esophagogastroduodenoscopy  09/28/2011    Procedure: ESOPHAGOGASTRODUODENOSCOPY (EGD);  Surgeon: Missy Sabins, MD;  Location: [facility] ENDOSCOPY;  Service: Endoscopy;  Laterality: N/A;  . Givens capsule study  09/30/2011    Procedure: GIVENS CAPSULE STUDY;  Surgeon: Jeryl Columbia, MD;  Location: [facility] ENDOSCOPY;  Service: Endoscopy;  Laterality: N/A;  . Cholecystectomy    .  Coronary angioplasty with stent placement  06/2007    TAXUS stent to RCA/notes 01/31/2011  . Tonsillectomy    . Av fistula placement  11/07/2011    Procedure: INSERTION OF ARTERIOVENOUS (AV) GORE-TEX GRAFT ARM;  Surgeon: Tinnie Gens, MD;  Location: [place];  Service: Vascular;  Laterality: Left;  . Arteriovenous graft placement Right 2009    forearm/notes 02/01/2011  . Insertion of dialysis catheter Right 2014  . Insertion of dialysis catheter Left 02/11/2013    Procedure: INSERTION OF DIALYSIS CATHETER;  Surgeon: Conrad Leon, MD;  Location: [place];  Service: Vascular;  Laterality: Left;  Ultrasound guided  . Removal of a dialysis catheter Right 02/11/2013    Procedure: REMOVAL OF A DIALYSIS CATHETER;  Surgeon: Conrad Crow Wing, MD;  Location: [place];  Service: Vascular;  Laterality: Right;  . Bascilic vein transposition Right 02/27/2013    Procedure: BASCILIC VEIN TRANSPOSITION;  Surgeon: Mal Misty, MD;  Location: [place];  Service: Vascular;  Laterality: Right;  Right Basilic Vein Transposition   . Shuntogram N/A 09/20/2011    Procedure: Earney Mallet;  Surgeon: Conrad Hillsdale, MD;  Location: [facility] CATH LAB;  Service: Cardiovascular;  Laterality: N/A;  . Venogram N/A 01/26/2013    Procedure: VENOGRAM;  Surgeon: Angelia Mould, MD;  Location: [facility] CATH LAB;  Service: Cardiovascular;  Laterality: N/A;  . Esophagogastroduodenoscopy N/A 04/07/2015    Procedure: ESOPHAGOGASTRODUODENOSCOPY (EGD);  Surgeon: Teena Irani, MD;  Location: [facility] ENDOS[facility];  Service: Endoscopy;  Laterality: N/A;  . Savory dilation N/A 04/07/2015    Procedure: SAVORY DILATION;  Surgeon: Teena Irani, MD;  Location: [facility] ENDOSCOPY;  Service: Endoscopy;  Laterality: N/A;  . Foreign body removal  09/2003    via upper endoscopy/notes 02/12/2011  . Esophagogastroduodenoscopy N/A 04/19/2015    Procedure: ESOPHAGOGASTRODUODENOSCOPY (EGD);  Surgeon: Arta Silence, MD;  Location: [facility] ENDOSCOPY;  Service: Endoscopy;  Laterality: N/A;  . Total knee  arthroplasty Right 08/02/2015    Procedure: TOTAL KNEE ARTHROPLASTY;  Surgeon: Renette Butters, MD;  Location: [place];  Service: Orthopedics;  Laterality: Right;      Medication List       This list is accurate as of: 09/05/15 11:59 PM.  Always use your most recent med list.               acetaminophen 325 MG tablet  Commonly known as:  TYLENOL  Take 1 tablet (325 mg total) by mouth every 4 (four) hours as needed for headache or mild pain.     allopurinol 100 MG tablet  Commonly known as:  ZYLOPRIM  Take 1 tablet (100 mg total) by mouth daily.     calcium-vitamin D 500-200 MG-UNIT tablet  Commonly known as:  OSCAL WITH D  Take 3 tablets by mouth 3 (three) times daily with meals.     carvedilol 12.5 MG tablet  Commonly known as:  COREG  Take 12.5 mg by mouth 2 (two) times daily. Only take  on NON-dialysis days     cinacalcet 60 MG tablet  Commonly known as:  SENSIPAR  Take 60 mg by mouth 2 (  two) times daily.     colesevelam 625 MG tablet  Commonly known as:  WELCHOL  Take 3 tablets (1,875 mg total) by mouth 2 (two) times daily with a meal.     colestipol 1 G tablet  Commonly known as:  COLESTID  Take 1 g by mouth 2 (two) times daily.     DEXILANT 60 MG capsule  Generic drug:  dexlansoprazole  Take 1 tablet by mouth daily as needed (acid reflux).     docusate sodium 100 MG capsule  Commonly known as:  COLACE  Take 1 capsule (100 mg total) by mouth 2 (two) times daily.     enoxaparin 100 MG/ML injection  Commonly known as:  LOVENOX  Inject 0.3 mLs (30 mg total) into the skin daily. For total of 30 day for DVT prophylaxis,  it can't be stopped if patient is fully ambulatory prior to 30 DAYS.     ezetimibe 10 MG tablet  Commonly known as:  ZETIA  Take 1 tablet (10 mg total) by mouth daily.     HYDROcodone-acetaminophen 5-325 MG tablet  Commonly known as:  NORCO/VICODIN  Take 1 tablet by mouth every 6 (six) hours as needed for severe pain.     lanthanum 1000 MG  chewable tablet  Commonly known as:  FOSRENOL  Chew 1,000 mg by mouth 2 times daily at 12 noon and 4 pm.     multivitamin Tabs tablet  Take 1 tablet by mouth daily.     nateglinide 60 MG tablet  Commonly known as:  STARLIX  Take 1 tablet (60 mg total) by mouth 3 (three) times daily with meals.        No orders of the defined types were placed in this encounter.    Immunization History  Administered Date(s) Administered  . Influenza Split 07/16/2013  . Influenza Whole 08/12/2008  . Influenza,inj,Quad PF,36+ Mos 07/01/2014  . Influenza-Unspecified 07/04/2015  . Pneumococcal Conjugate-13 01/13/2014  . Pneumococcal Polysaccharide-23 08/31/2008    Social History  Substance Use Topics  . Smoking status: Former Smoker -- 1.00 packs/day for 25 years    Types: Cigarettes  . Smokeless tobacco: Former Systems developer    Quit date: 10/01/2005     Comment: Quit smoking 2007 Smoked x 25 years 1/2 ppd.  . Alcohol Use: 0.0 oz/week    0 Standard drinks or equivalent per week     Comment: occassional     Review of Systems  DATA OBTAINED: from patient, nurse- GENERAL:  no fevers, fatigue, appetite changes SKIN: No itching, rash HEENT: No complaint RESPIRATORY: No cough, wheezing, SOB CARDIAC: No chest pain, palpitations, lower extremity edema  GI: No abdominal pain, No N/V/D or constipation, No heartburn or reflux  GU: No dysuria, frequency or urgency, or incontinence  MUSCULOSKELETAL--at this point is not complaining of knee pain medications appear to be effective--says his knee has a popping sound at times when flexed NEUROLOGIC: No headache, dizziness  PSYCHIATRIC: No overt anxiety or sadness  Filed Vitals:   09/05/15 1652  BP: 138/61  Pulse: 72  Temp: 97.1 F (36.2 C)  Resp: 20    Physical Exam  GENERAL APPEARANCE: Alert, conversant, No acute distress  SKIN: No diaphoresis rash HEENT: Unremarkable pharynx is clear mucous membranes moist RESPIRATORY: Breathing is even,  unlabored. Lung sounds are clear   CARDIOVASCULAR: Heart RRR no murmurs, rubs or gallops. trace peripheral edema LLE GASTROINTESTINAL: Abdomen is soft, non-tender, not distended w/ normal bowel sounds.  GENITOURINARY: Bladder non tender,  not distended  MUSCULOSKELETAL: R knee - Has some mild residual edema-this does not appear to be significantly concerning-there is crusting of the surgical site there is no sign of infection Steri-Strips are in place-is able to move all extremities 4. I did not appreciate significant pain complaints when knee was flexed   NEUROLOGIC: Cranial nerves 2-12 grossly intact. Moves all extremities PSYCHIATRIC: Mood and affect appropriate to situation, no behavioral issues  Patient Active Problem List   Diagnosis Date Noted  . Right leg pain 09/04/2015  . Chest pain 08/09/2015  . Acute encephalopathy 08/09/2015  . Encephalopathy acute 08/09/2015  . S/P right total knee replacement 08/09/2015  . Primary osteoarthritis of right knee 08/05/2015  . Neuropathy due to secondary diabetes mellitus ([place]) 08/05/2015  . Arthritis of knee 08/02/2015  . Headache 06/08/2015  . Melena 04/19/2015  . Acute blood loss anemia 04/18/2015  . Dialysis patient ([place]) 04/18/2015  . Hyperthyroidism 03/11/2015  . Thigh pain 12/08/2014  . PSA elevation 08/30/2014  . Allergic rhinitis, cause unspecified 02/24/2014  . Elevated PSA 02/24/2014  . Vertigo 10/14/2013  . Numbness 05/15/2013  . ESRD on dialysis ([place]) 04/06/2013  . Chronic systolic heart failure ([place]) 02/09/2013  . Other complications due to renal dialysis device, implant, and graft 01/14/2013  . Right hand pain 03/28/2012  . Dysphagia 03/28/2012  . Hypogonadism, male 03/28/2012  . OSA (obstructive sleep apnea) 10/16/2011  . Hyperlipidemia 10/16/2011  . CAD (coronary artery disease) 09/28/2011  . NSTEMI (non-ST elevated myocardial infarction) ([place]) 09/28/2011  . AVM (arteriovenous malformation) 09/27/2011  . Anemia  associated with acute blood loss 09/26/2011  . Anemia 06/16/2011  . Ischemic cardiomyopathy 06/16/2011  . Hyperparathyroidism, secondary ([place]) 06/16/2011  . Preventative health care 03/11/2011  . NECK PAIN 07/31/2010  . GYNECOMASTIA 07/17/2010  . DIZZINESS 07/17/2010  . GAIT DISTURBANCE 03/03/2010  . Thyrotoxicosis 02/02/2010  . Type 2 diabetes mellitus with diabetic polyneuropathy ([place]) 02/01/2010  . TRANSAMINASES, SERUM, ELEVATED 02/01/2010  . Other malaise and fatigue 11/24/2009  . DEPRESSION 10/14/2009  . BENIGN PROSTATIC HYPERTROPHY 10/14/2009  . COLONIC POLYPS, HX OF 10/14/2009  . Dementia 09/02/2009  . PULMONARY NODULE, RIGHT LOWER LOBE 06/08/2009  . DIASTOLIC HEART FAILURE, CHRONIC 02/06/2009  . DYSPNEA 10/29/2008  . FOOT PAIN 08/12/2008  . ONYCHOMYCOSIS, TOENAILS 12/26/2007  . GOITER, MULTINODULAR 12/26/2007  . GOUT 06/18/2007  . PERIPHERAL NEUROPATHY 06/18/2007  . Hypertensive heart disease with CHF (congestive heart failure) ([place]) 06/18/2007  . GERD 06/18/2007    Labs.  09/01/2015.  WBC 5.3 hemoglobin 8.1 platelets 333.    CBC    Component Value Date/Time   WBC 8.9 08/09/2015 0045   RBC 3.39* 08/09/2015 0045   HGB 10.5* 08/09/2015 0116   HCT 31.0* 08/09/2015 0116   PLT 302 08/09/2015 0045   MCV 88.5 08/09/2015 0045   LYMPHSABS 2.0 04/18/2015 1404   MONOABS 0.6 04/18/2015 1404   EOSABS 0.2 04/18/2015 1404   BASOSABS 0.1 04/18/2015 1404    CMP     Component Value Date/Time   NA 138 08/10/2015 0405   K 5.3* 08/10/2015 0405   CL 98* 08/10/2015 0405   CO2 24 08/10/2015 0405   GLUCOSE 123* 08/10/2015 0405   BUN 70* 08/10/2015 0405   CREATININE 11.02* 08/10/2015 0405   CALCIUM 10.0 08/10/2015 0405   PROT 7.2 08/10/2015 0405   ALBUMIN 2.8* 08/10/2015 0405   AST 42* 08/10/2015 0405   ALT 33 08/10/2015 0405   ALKPHOS 79 08/10/2015 0405   BILITOT 1.1  08/10/2015 0405   GFRNONAA 4* 08/10/2015 0405   GFRAA 5* 08/10/2015 0405    Assessment and  Plan   history of right total knee replacement he will need continued PT and OT at home as well as nursing support for his multiple medical issues also will need a raised toilet and continued CPM machine secondary to limited ambulatory status.  At this point pain appears to be controlled he is complaining of a popping sound will check an x-ray to make sure replacement is stable.   acute encephalopathy this was initially in the hospital pain medications were changed his appears to have resolved.   chest pain in the hospital again workup appear to be negative in the hospital there has been no recurrence.  # end-stage renal disease continues on dialysis 3 days a week.  History of gout he continues on allopurinol this has not really been an issue during his stay here it appears.  Hyperlipedemia-- patient is statin intolerant he continues on colestipol and Zetia-since his stay here is been short no lipid panel was pursued.  Type 2 diabetes with neuropathy-this was well-controlled on Starlix A1c 6.1 recently Will order CBGs before discharge to make sure there is stability     History of neuropathy his Neurontin has been restarted apparently he is receiving relief with this.  In history of CHF with hypertensive heart disease-continues on Coreg at this point appears to be stable--he is not on NACE with a history of end-stage renal disease-he is statin intolerant.  [id number] note greater than 30 minutes spent on this discharge summary-greater than 50% of time spent coordinating plan of care for numerous diagnoses       LASSEN, ARLO C,

## 2015-09-06 DIAGNOSIS — D509 Iron deficiency anemia, unspecified: Secondary | ICD-10-CM | POA: Diagnosis not present

## 2015-09-06 DIAGNOSIS — E119 Type 2 diabetes mellitus without complications: Secondary | ICD-10-CM | POA: Diagnosis not present

## 2015-09-06 DIAGNOSIS — N186 End stage renal disease: Secondary | ICD-10-CM | POA: Diagnosis not present

## 2015-09-06 DIAGNOSIS — D631 Anemia in chronic kidney disease: Secondary | ICD-10-CM | POA: Diagnosis not present

## 2015-09-06 DIAGNOSIS — N2581 Secondary hyperparathyroidism of renal origin: Secondary | ICD-10-CM | POA: Diagnosis not present

## 2015-09-08 DIAGNOSIS — D631 Anemia in chronic kidney disease: Secondary | ICD-10-CM | POA: Diagnosis not present

## 2015-09-08 DIAGNOSIS — E119 Type 2 diabetes mellitus without complications: Secondary | ICD-10-CM | POA: Diagnosis not present

## 2015-09-08 DIAGNOSIS — D509 Iron deficiency anemia, unspecified: Secondary | ICD-10-CM | POA: Diagnosis not present

## 2015-09-08 DIAGNOSIS — N186 End stage renal disease: Secondary | ICD-10-CM | POA: Diagnosis not present

## 2015-09-08 DIAGNOSIS — N2581 Secondary hyperparathyroidism of renal origin: Secondary | ICD-10-CM | POA: Diagnosis not present

## 2015-09-09 ENCOUNTER — Ambulatory Visit: Payer: Medicare Other | Admitting: Internal Medicine

## 2015-09-09 ENCOUNTER — Ambulatory Visit: Payer: Medicare Other | Admitting: Endocrinology

## 2015-09-09 DIAGNOSIS — Z992 Dependence on renal dialysis: Secondary | ICD-10-CM | POA: Diagnosis not present

## 2015-09-09 DIAGNOSIS — T82858D Stenosis of vascular prosthetic devices, implants and grafts, subsequent encounter: Secondary | ICD-10-CM | POA: Diagnosis not present

## 2015-09-09 DIAGNOSIS — N186 End stage renal disease: Secondary | ICD-10-CM | POA: Diagnosis not present

## 2015-09-09 DIAGNOSIS — I871 Compression of vein: Secondary | ICD-10-CM | POA: Diagnosis not present

## 2015-09-10 ENCOUNTER — Emergency Department (HOSPITAL_COMMUNITY): Payer: Medicare Other

## 2015-09-10 ENCOUNTER — Encounter (HOSPITAL_COMMUNITY): Payer: Self-pay | Admitting: *Deleted

## 2015-09-10 ENCOUNTER — Emergency Department (HOSPITAL_COMMUNITY)
Admission: EM | Admit: 2015-09-10 | Discharge: 2015-09-10 | Disposition: A | Discharge status: Home / Self Care | Payer: Medicare Other | Attending: Emergency Medicine | Admitting: Emergency Medicine

## 2015-09-10 DIAGNOSIS — F0390 Unspecified dementia without behavioral disturbance: Secondary | ICD-10-CM | POA: Insufficient documentation

## 2015-09-10 DIAGNOSIS — R52 Pain, unspecified: Secondary | ICD-10-CM | POA: Diagnosis not present

## 2015-09-10 DIAGNOSIS — G629 Polyneuropathy, unspecified: Secondary | ICD-10-CM | POA: Diagnosis not present

## 2015-09-10 DIAGNOSIS — E211 Secondary hyperparathyroidism, not elsewhere classified: Secondary | ICD-10-CM | POA: Diagnosis not present

## 2015-09-10 DIAGNOSIS — F329 Major depressive disorder, single episode, unspecified: Secondary | ICD-10-CM | POA: Insufficient documentation

## 2015-09-10 DIAGNOSIS — R079 Chest pain, unspecified: Secondary | ICD-10-CM | POA: Diagnosis not present

## 2015-09-10 DIAGNOSIS — Z9981 Dependence on supplemental oxygen: Secondary | ICD-10-CM | POA: Diagnosis not present

## 2015-09-10 DIAGNOSIS — N186 End stage renal disease: Secondary | ICD-10-CM | POA: Insufficient documentation

## 2015-09-10 DIAGNOSIS — G4733 Obstructive sleep apnea (adult) (pediatric): Secondary | ICD-10-CM | POA: Diagnosis not present

## 2015-09-10 DIAGNOSIS — Z87891 Personal history of nicotine dependence: Secondary | ICD-10-CM | POA: Insufficient documentation

## 2015-09-10 DIAGNOSIS — Z8619 Personal history of other infectious and parasitic diseases: Secondary | ICD-10-CM | POA: Diagnosis not present

## 2015-09-10 DIAGNOSIS — Z9861 Coronary angioplasty status: Secondary | ICD-10-CM | POA: Insufficient documentation

## 2015-09-10 DIAGNOSIS — Z8709 Personal history of other diseases of the respiratory system: Secondary | ICD-10-CM | POA: Diagnosis not present

## 2015-09-10 DIAGNOSIS — Z7984 Long term (current) use of oral hypoglycemic drugs: Secondary | ICD-10-CM | POA: Diagnosis not present

## 2015-09-10 DIAGNOSIS — I120 Hypertensive chronic kidney disease with stage 5 chronic kidney disease or end stage renal disease: Secondary | ICD-10-CM | POA: Diagnosis not present

## 2015-09-10 DIAGNOSIS — I2510 Atherosclerotic heart disease of native coronary artery without angina pectoris: Secondary | ICD-10-CM | POA: Diagnosis not present

## 2015-09-10 DIAGNOSIS — R109 Unspecified abdominal pain: Secondary | ICD-10-CM | POA: Diagnosis not present

## 2015-09-10 DIAGNOSIS — Z79899 Other long term (current) drug therapy: Secondary | ICD-10-CM | POA: Insufficient documentation

## 2015-09-10 DIAGNOSIS — Z992 Dependence on renal dialysis: Secondary | ICD-10-CM | POA: Diagnosis not present

## 2015-09-10 DIAGNOSIS — E119 Type 2 diabetes mellitus without complications: Secondary | ICD-10-CM | POA: Diagnosis not present

## 2015-09-10 DIAGNOSIS — M109 Gout, unspecified: Secondary | ICD-10-CM | POA: Diagnosis not present

## 2015-09-10 DIAGNOSIS — Z862 Personal history of diseases of the blood and blood-forming organs and certain disorders involving the immune mechanism: Secondary | ICD-10-CM | POA: Diagnosis not present

## 2015-09-10 DIAGNOSIS — K219 Gastro-esophageal reflux disease without esophagitis: Secondary | ICD-10-CM | POA: Insufficient documentation

## 2015-09-10 DIAGNOSIS — Z9889 Other specified postprocedural states: Secondary | ICD-10-CM | POA: Insufficient documentation

## 2015-09-10 DIAGNOSIS — D509 Iron deficiency anemia, unspecified: Secondary | ICD-10-CM | POA: Diagnosis not present

## 2015-09-10 DIAGNOSIS — D631 Anemia in chronic kidney disease: Secondary | ICD-10-CM | POA: Diagnosis not present

## 2015-09-10 DIAGNOSIS — Z86010 Personal history of colonic polyps: Secondary | ICD-10-CM | POA: Insufficient documentation

## 2015-09-10 DIAGNOSIS — N2581 Secondary hyperparathyroidism of renal origin: Secondary | ICD-10-CM | POA: Diagnosis not present

## 2015-09-10 DIAGNOSIS — Z87438 Personal history of other diseases of male genital organs: Secondary | ICD-10-CM | POA: Diagnosis not present

## 2015-09-10 DIAGNOSIS — I5032 Chronic diastolic (congestive) heart failure: Secondary | ICD-10-CM | POA: Insufficient documentation

## 2015-09-10 DIAGNOSIS — E785 Hyperlipidemia, unspecified: Secondary | ICD-10-CM | POA: Insufficient documentation

## 2015-09-10 LAB — CBC WITH DIFFERENTIAL/PLATELET
Basophils Absolute: 0 10*3/uL (ref 0.0–0.1)
Basophils Relative: 0 %
Eosinophils Absolute: 0.2 10*3/uL (ref 0.0–0.7)
Eosinophils Relative: 3 %
HCT: 31.8 % — ABNORMAL LOW (ref 39.0–52.0)
Hemoglobin: 9.4 g/dL — ABNORMAL LOW (ref 13.0–17.0)
Lymphocytes Relative: 27 %
Lymphs Abs: 1.5 10*3/uL (ref 0.7–4.0)
MCH: 27.5 pg (ref 26.0–34.0)
MCHC: 29.6 g/dL — ABNORMAL LOW (ref 30.0–36.0)
MCV: 93 fL (ref 78.0–100.0)
Monocytes Absolute: 0.7 10*3/uL (ref 0.1–1.0)
Monocytes Relative: 13 %
Neutro Abs: 3 10*3/uL (ref 1.7–7.7)
Neutrophils Relative %: 56 %
Platelets: 260 10*3/uL (ref 150–400)
RBC: 3.42 MIL/uL — ABNORMAL LOW (ref 4.22–5.81)
RDW: 21.5 % — ABNORMAL HIGH (ref 11.5–15.5)
WBC: 5.4 10*3/uL (ref 4.0–10.5)

## 2015-09-10 LAB — BASIC METABOLIC PANEL
Anion gap: 10 (ref 5–15)
BUN: 16 mg/dL (ref 6–20)
CO2: 31 mmol/L (ref 22–32)
Calcium: 9.1 mg/dL (ref 8.9–10.3)
Chloride: 101 mmol/L (ref 101–111)
Creatinine, Ser: 6.44 mg/dL — ABNORMAL HIGH (ref 0.61–1.24)
GFR calc Af Amer: 9 mL/min — ABNORMAL LOW (ref >60)
GFR calc non Af Amer: 8 mL/min — ABNORMAL LOW (ref >60)
Glucose, Bld: 127 mg/dL — ABNORMAL HIGH (ref 65–99)
Potassium: 3.6 mmol/L (ref 3.5–5.1)
Sodium: 142 mmol/L (ref 135–145)

## 2015-09-10 LAB — LACTIC ACID, PLASMA: Lactic Acid, Venous: 1.2 mmol/L (ref 0.5–2.0)

## 2015-09-10 MED ORDER — KETOROLAC TROMETHAMINE 15 MG/ML IJ SOLN
15.0000 mg | Freq: Once | INTRAMUSCULAR | Status: AC
  Administered 2015-09-10: 15 mg via INTRAVENOUS
  Filled 2015-09-10: qty 1

## 2015-09-10 MED ORDER — MORPHINE SULFATE (PF) 4 MG/ML IV SOLN
6.0000 mg | Freq: Once | INTRAVENOUS | Status: AC
  Administered 2015-09-10: 6 mg via INTRAVENOUS
  Filled 2015-09-10: qty 2

## 2015-09-10 NOTE — ED Notes (Signed)
Dr. Kohut at bedside 

## 2015-09-10 NOTE — ED Notes (Signed)
Pt. Wheel-chaired to the bathroom.

## 2015-09-10 NOTE — ED Notes (Signed)
Pt arrives from home via [facility]. Pt states he finished his dialysis tx and went home and then began having generalized b/a. Pt rates pain 1/10.

## 2015-09-10 NOTE — Discharge Instructions (Signed)

## 2015-09-10 NOTE — ED Notes (Signed)
IV team at bedside 

## 2015-09-10 NOTE — ED Notes (Signed)
Pt walked to the door and was yelling out of the door asking for help. Pt call bell was in the bed and pt was yelling from room.

## 2015-09-12 DIAGNOSIS — Z471 Aftercare following joint replacement surgery: Secondary | ICD-10-CM | POA: Diagnosis not present

## 2015-09-12 DIAGNOSIS — I5043 Acute on chronic combined systolic (congestive) and diastolic (congestive) heart failure: Secondary | ICD-10-CM | POA: Diagnosis not present

## 2015-09-12 DIAGNOSIS — E1142 Type 2 diabetes mellitus with diabetic polyneuropathy: Secondary | ICD-10-CM | POA: Diagnosis not present

## 2015-09-12 DIAGNOSIS — I2510 Atherosclerotic heart disease of native coronary artery without angina pectoris: Secondary | ICD-10-CM | POA: Diagnosis not present

## 2015-09-12 DIAGNOSIS — N186 End stage renal disease: Secondary | ICD-10-CM | POA: Diagnosis not present

## 2015-09-12 DIAGNOSIS — I132 Hypertensive heart and chronic kidney disease with heart failure and with stage 5 chronic kidney disease, or end stage renal disease: Secondary | ICD-10-CM | POA: Diagnosis not present

## 2015-09-13 ENCOUNTER — Telehealth: Payer: Self-pay | Admitting: *Deleted

## 2015-09-13 DIAGNOSIS — E119 Type 2 diabetes mellitus without complications: Secondary | ICD-10-CM | POA: Diagnosis not present

## 2015-09-13 DIAGNOSIS — D509 Iron deficiency anemia, unspecified: Secondary | ICD-10-CM | POA: Diagnosis not present

## 2015-09-13 DIAGNOSIS — D631 Anemia in chronic kidney disease: Secondary | ICD-10-CM | POA: Diagnosis not present

## 2015-09-13 DIAGNOSIS — N186 End stage renal disease: Secondary | ICD-10-CM | POA: Diagnosis not present

## 2015-09-13 DIAGNOSIS — N2581 Secondary hyperparathyroidism of renal origin: Secondary | ICD-10-CM | POA: Diagnosis not present

## 2015-09-13 NOTE — Telephone Encounter (Signed)
Called amanda no answer LMOM ok  For verbal.../lmb

## 2015-09-13 NOTE — Telephone Encounter (Signed)
Left msg on triage yesterday afternoon stating requesting verbal for 2x's week for 3 wks then 1x week for 4wks PT/OT help manage CHF, diabetes, Hypertension, Medication management...[name]

## 2015-09-14 DIAGNOSIS — Z96651 Presence of right artificial knee joint: Secondary | ICD-10-CM | POA: Diagnosis not present

## 2015-09-14 DIAGNOSIS — I5043 Acute on chronic combined systolic (congestive) and diastolic (congestive) heart failure: Secondary | ICD-10-CM | POA: Diagnosis not present

## 2015-09-14 DIAGNOSIS — N186 End stage renal disease: Secondary | ICD-10-CM | POA: Diagnosis not present

## 2015-09-14 DIAGNOSIS — Z471 Aftercare following joint replacement surgery: Secondary | ICD-10-CM | POA: Diagnosis not present

## 2015-09-14 DIAGNOSIS — E1142 Type 2 diabetes mellitus with diabetic polyneuropathy: Secondary | ICD-10-CM | POA: Diagnosis not present

## 2015-09-14 DIAGNOSIS — I132 Hypertensive heart and chronic kidney disease with heart failure and with stage 5 chronic kidney disease, or end stage renal disease: Secondary | ICD-10-CM | POA: Diagnosis not present

## 2015-09-14 DIAGNOSIS — I2510 Atherosclerotic heart disease of native coronary artery without angina pectoris: Secondary | ICD-10-CM | POA: Diagnosis not present

## 2015-09-15 DIAGNOSIS — D509 Iron deficiency anemia, unspecified: Secondary | ICD-10-CM | POA: Diagnosis not present

## 2015-09-15 DIAGNOSIS — N186 End stage renal disease: Secondary | ICD-10-CM | POA: Diagnosis not present

## 2015-09-15 DIAGNOSIS — D631 Anemia in chronic kidney disease: Secondary | ICD-10-CM | POA: Diagnosis not present

## 2015-09-15 DIAGNOSIS — N2581 Secondary hyperparathyroidism of renal origin: Secondary | ICD-10-CM | POA: Diagnosis not present

## 2015-09-15 DIAGNOSIS — E119 Type 2 diabetes mellitus without complications: Secondary | ICD-10-CM | POA: Diagnosis not present

## 2015-09-15 LAB — CULTURE, BLOOD (ROUTINE X 2): Culture: NO GROWTH

## 2015-09-16 ENCOUNTER — Ambulatory Visit: Payer: Medicare Other | Admitting: Endocrinology

## 2015-09-16 DIAGNOSIS — Z471 Aftercare following joint replacement surgery: Secondary | ICD-10-CM | POA: Diagnosis not present

## 2015-09-16 DIAGNOSIS — I2510 Atherosclerotic heart disease of native coronary artery without angina pectoris: Secondary | ICD-10-CM | POA: Diagnosis not present

## 2015-09-16 DIAGNOSIS — N186 End stage renal disease: Secondary | ICD-10-CM | POA: Diagnosis not present

## 2015-09-16 DIAGNOSIS — I5043 Acute on chronic combined systolic (congestive) and diastolic (congestive) heart failure: Secondary | ICD-10-CM | POA: Diagnosis not present

## 2015-09-16 DIAGNOSIS — I132 Hypertensive heart and chronic kidney disease with heart failure and with stage 5 chronic kidney disease, or end stage renal disease: Secondary | ICD-10-CM | POA: Diagnosis not present

## 2015-09-16 DIAGNOSIS — E1142 Type 2 diabetes mellitus with diabetic polyneuropathy: Secondary | ICD-10-CM | POA: Diagnosis not present

## 2015-09-17 DIAGNOSIS — D631 Anemia in chronic kidney disease: Secondary | ICD-10-CM | POA: Diagnosis not present

## 2015-09-17 DIAGNOSIS — N2581 Secondary hyperparathyroidism of renal origin: Secondary | ICD-10-CM | POA: Diagnosis not present

## 2015-09-17 DIAGNOSIS — N186 End stage renal disease: Secondary | ICD-10-CM | POA: Diagnosis not present

## 2015-09-17 DIAGNOSIS — E119 Type 2 diabetes mellitus without complications: Secondary | ICD-10-CM | POA: Diagnosis not present

## 2015-09-17 DIAGNOSIS — D509 Iron deficiency anemia, unspecified: Secondary | ICD-10-CM | POA: Diagnosis not present

## 2015-09-19 DIAGNOSIS — I132 Hypertensive heart and chronic kidney disease with heart failure and with stage 5 chronic kidney disease, or end stage renal disease: Secondary | ICD-10-CM | POA: Diagnosis not present

## 2015-09-19 DIAGNOSIS — N186 End stage renal disease: Secondary | ICD-10-CM | POA: Diagnosis not present

## 2015-09-19 DIAGNOSIS — E1142 Type 2 diabetes mellitus with diabetic polyneuropathy: Secondary | ICD-10-CM | POA: Diagnosis not present

## 2015-09-19 DIAGNOSIS — I2510 Atherosclerotic heart disease of native coronary artery without angina pectoris: Secondary | ICD-10-CM | POA: Diagnosis not present

## 2015-09-19 DIAGNOSIS — I5043 Acute on chronic combined systolic (congestive) and diastolic (congestive) heart failure: Secondary | ICD-10-CM | POA: Diagnosis not present

## 2015-09-19 DIAGNOSIS — Z471 Aftercare following joint replacement surgery: Secondary | ICD-10-CM | POA: Diagnosis not present

## 2015-09-20 DIAGNOSIS — D631 Anemia in chronic kidney disease: Secondary | ICD-10-CM | POA: Diagnosis not present

## 2015-09-20 DIAGNOSIS — E119 Type 2 diabetes mellitus without complications: Secondary | ICD-10-CM | POA: Diagnosis not present

## 2015-09-20 DIAGNOSIS — N2581 Secondary hyperparathyroidism of renal origin: Secondary | ICD-10-CM | POA: Diagnosis not present

## 2015-09-20 DIAGNOSIS — D509 Iron deficiency anemia, unspecified: Secondary | ICD-10-CM | POA: Diagnosis not present

## 2015-09-20 DIAGNOSIS — N186 End stage renal disease: Secondary | ICD-10-CM | POA: Diagnosis not present

## 2015-09-21 ENCOUNTER — Ambulatory Visit (INDEPENDENT_AMBULATORY_CARE_PROVIDER_SITE_OTHER): Payer: Medicare Other | Admitting: Internal Medicine

## 2015-09-21 ENCOUNTER — Ambulatory Visit (HOSPITAL_COMMUNITY)
Admission: RE | Admit: 2015-09-21 | Discharge: 2015-09-21 | Disposition: A | Discharge status: Home / Self Care | Payer: Medicare Other | Source: Ambulatory Visit | Attending: Internal Medicine | Admitting: Internal Medicine

## 2015-09-21 ENCOUNTER — Ambulatory Visit: Payer: Medicare Other | Admitting: Internal Medicine

## 2015-09-21 ENCOUNTER — Encounter: Payer: Self-pay | Admitting: Internal Medicine

## 2015-09-21 VITALS — BP 141/59 | HR 80 | Ht 73.0 in | Wt 227.0 lb

## 2015-09-21 VITALS — BP 154/82 | HR 77 | Temp 98.3°F | Ht 73.0 in | Wt 225.0 lb

## 2015-09-21 DIAGNOSIS — E669 Obesity, unspecified: Secondary | ICD-10-CM | POA: Insufficient documentation

## 2015-09-21 DIAGNOSIS — J309 Allergic rhinitis, unspecified: Secondary | ICD-10-CM

## 2015-09-21 DIAGNOSIS — I5032 Chronic diastolic (congestive) heart failure: Secondary | ICD-10-CM | POA: Diagnosis not present

## 2015-09-21 DIAGNOSIS — F329 Major depressive disorder, single episode, unspecified: Secondary | ICD-10-CM | POA: Diagnosis not present

## 2015-09-21 DIAGNOSIS — F419 Anxiety disorder, unspecified: Secondary | ICD-10-CM | POA: Diagnosis not present

## 2015-09-21 DIAGNOSIS — Z471 Aftercare following joint replacement surgery: Secondary | ICD-10-CM | POA: Diagnosis not present

## 2015-09-21 DIAGNOSIS — R5383 Other fatigue: Secondary | ICD-10-CM | POA: Diagnosis not present

## 2015-09-21 DIAGNOSIS — G629 Polyneuropathy, unspecified: Secondary | ICD-10-CM | POA: Diagnosis not present

## 2015-09-21 DIAGNOSIS — E1142 Type 2 diabetes mellitus with diabetic polyneuropathy: Secondary | ICD-10-CM | POA: Diagnosis not present

## 2015-09-21 DIAGNOSIS — I2510 Atherosclerotic heart disease of native coronary artery without angina pectoris: Secondary | ICD-10-CM

## 2015-09-21 DIAGNOSIS — N186 End stage renal disease: Secondary | ICD-10-CM | POA: Diagnosis not present

## 2015-09-21 DIAGNOSIS — I2583 Coronary atherosclerosis due to lipid rich plaque: Secondary | ICD-10-CM

## 2015-09-21 DIAGNOSIS — I132 Hypertensive heart and chronic kidney disease with heart failure and with stage 5 chronic kidney disease, or end stage renal disease: Secondary | ICD-10-CM | POA: Diagnosis not present

## 2015-09-21 DIAGNOSIS — I5043 Acute on chronic combined systolic (congestive) and diastolic (congestive) heart failure: Secondary | ICD-10-CM | POA: Diagnosis not present

## 2015-09-21 DIAGNOSIS — F32A Depression, unspecified: Secondary | ICD-10-CM

## 2015-09-21 MED ORDER — GABAPENTIN 300 MG PO CAPS: ORAL_CAPSULE | ORAL | Status: DC

## 2015-09-21 MED ORDER — CITALOPRAM HYDROBROMIDE 10 MG PO TABS
10.0000 mg | ORAL_TABLET | Freq: Every day | ORAL | Status: DC
Start: 2015-09-21 — End: 2016-09-18

## 2015-09-21 NOTE — Patient Instructions (Signed)
FOLLOW UP : 12 months  HAPPY HOLIDAYS!!!!

## 2015-09-21 NOTE — Patient Instructions (Signed)
Please take all new medication as prescribed - the celexa 10 mg per day  Please call in one month if not improved enough for increase dose to 20 mg  Ok to increase the gabapentin 300 mg to 2 pills at bedtime to help with sleep and pain  You can also take Mucinex (or it's generic off brand) for congestion and fluid in the ears, as well as OTC Nasonex for allergies, and tylenol as needed for pain.  Please continue all other medications as before, and refills have been done if requested.  Please have the pharmacy call with any other refills you may need.  Please keep your appointments with your specialists as you may have planned

## 2015-09-21 NOTE — Progress Notes (Signed)
Subjective:    Patient ID: [name], male    DOB: 1950-11-19, 64 y.o.   MRN: [id number]  HPI  Here to f/u after recent right knee TKA, in rehab after.  Also saw cardiollogy this am - volume managed with HD, with some concern for anxiety/depression. S/p knee surgury nov 1, but still essentially spending most of his time in wheelchair> Had an emotional upset episode dec 10 and seen in ED.  Wife states he is difficult to manage, hard time even getting OOB in the AM.  Therapist and cardiology both concern for depression.  Pt not sure about worsening depressive symptoms, mostly manifested to him with marked fatigue and low mood, and "I'm sick" and "I feel bad" but cant be more specific except "my whole body is run down." but no suicidal ideation, or panic.. Now also on amlodipine for recent elev BP.  "My whole body is aching"  Wife is concerned that he still has "brain fog" after the recent anesthesia.  Pt denies fever, wt loss, night sweats, or other constitutional symptoms.  Requires help now with meds when he used to do it himself,  More trouble with memory recently and cognition, has planned appt with neurology soon, had to be reshceduled per wife as he is just not functioning well.  Rehab not goig as well post inpatient rehab due to lack of energy and motivation. Has trouble getting to sleep, used to take elavil.  No recent psychosis symptoms. Past Medical History  Diagnosis Date  . Hyperthyroidism   . GASTROENTERITIS, VIRAL 10/14/2009  . ONYCHOMYCOSIS, TOENAILS 12/26/2007  . GOITER, MULTINODULAR 12/26/2007  . HYPERTHYROIDISM 02/02/2010  . DIABETES MELLITUS, TYPE II 02/01/2010  . DYSLIPIDEMIA 06/18/2007  . GOUT 06/18/2007  . Hypocalcemia 06/07/2010  . DEPRESSION 10/14/2009  . PERIPHERAL NEUROPATHY 06/18/2007  . HYPERTENSION 06/18/2007  . CONGESTIVE HEART FAILURE 06/18/2007  . DIASTOLIC HEART FAILURE, CHRONIC 02/06/2009  . Unspecified hypotension 01/30/2010  . PULMONARY NODULE, RIGHT LOWER LOBE 06/08/2009  .  GERD 06/18/2007  . BENIGN PROSTATIC HYPERTROPHY 10/14/2009  . GYNECOMASTIA 07/17/2010  . NECK PAIN 07/31/2010  . FOOT PAIN 08/12/2008  . DIZZINESS 07/17/2010  . Other malaise and fatigue 11/24/2009  . GAIT DISTURBANCE 03/03/2010  . DYSPNEA 10/29/2008  . CHEST PAIN 03/29/2010  . TRANSAMINASES, SERUM, ELEVATED 02/01/2010  . COLONIC POLYPS, HX OF 10/14/2009  . Anemia 06/16/2011  . Ischemic cardiomyopathy 06/16/2011  . Hyperparathyroidism, secondary ([place]) 06/16/2011  . OSA on CPAP 10/16/2011  . Hyperlipidemia 10/16/2011  . CAD, NATIVE VESSEL 02/06/2009    saw Dr. Missy Sabins last jan 2013  . Sleep apnea     cpap machine and o2  . Allergic rhinitis, cause unspecified 02/24/2014  . Transfusion history     none recent  . Hemodialysis access, fistula mature [facility])     Dialysis T-Th-Sa ([place]) Right upper arm fistula  . ESRD (end stage renal disease) on dialysis ([place]) 08/04/2010    "TTS;  " (04/18/2015)  . Dementia [id number]  . Renal insufficiency    Past Surgical History  Procedure Laterality Date  . Coronary angioplasty with stent placement  06/11/2008  . Cervical spine surgery  2/09    "to repair nerve problems in my left arm"  . Back surgery  1998  . Esophagogastroduodenoscopy  09/28/2011    Procedure: ESOPHAGOGASTRODUODENOSCOPY (EGD);  Surgeon: Missy Sabins, MD;  Location: [facility] ENDOSCOPY;  Service: Endoscopy;  Laterality: N/A;  . Givens capsule study  09/30/2011  Procedure: GIVENS CAPSULE STUDY;  Surgeon: Jeryl Columbia, MD;  Location: [facility] ENDOSCOPY;  Service: Endoscopy;  Laterality: N/A;  . Cholecystectomy    . Coronary angioplasty with stent placement  06/2007    TAXUS stent to RCA/notes 01/31/2011  . Tonsillectomy    . Av fistula placement  11/07/2011    Procedure: INSERTION OF ARTERIOVENOUS (AV) GORE-TEX GRAFT ARM;  Surgeon: Tinnie Gens, MD;  Location: [place];  Service: Vascular;  Laterality: Left;  . Arteriovenous graft placement Right 2009    forearm/notes 02/01/2011  .  Insertion of dialysis catheter Right 2014  . Insertion of dialysis catheter Left 02/11/2013    Procedure: INSERTION OF DIALYSIS CATHETER;  Surgeon: Conrad Otsego, MD;  Location: [place];  Service: Vascular;  Laterality: Left;  Ultrasound guided  . Removal of a dialysis catheter Right 02/11/2013    Procedure: REMOVAL OF A DIALYSIS CATHETER;  Surgeon: Conrad Kanab, MD;  Location: [place];  Service: Vascular;  Laterality: Right;  . Bascilic vein transposition Right 02/27/2013    Procedure: BASCILIC VEIN TRANSPOSITION;  Surgeon: Mal Misty, MD;  Location: [place];  Service: Vascular;  Laterality: Right;  Right Basilic Vein Transposition   . Shuntogram N/A 09/20/2011    Procedure: Earney Mallet;  Surgeon: Conrad Morton, MD;  Location: [facility] CATH LAB;  Service: Cardiovascular;  Laterality: N/A;  . Venogram N/A 01/26/2013    Procedure: VENOGRAM;  Surgeon: Angelia Mould, MD;  Location: [facility] CATH LAB;  Service: Cardiovascular;  Laterality: N/A;  . Esophagogastroduodenoscopy N/A 04/07/2015    Procedure: ESOPHAGOGASTRODUODENOSCOPY (EGD);  Surgeon: Teena Irani, MD;  Location: [facility] ENDOSCOPY;  Service: Endoscopy;  Laterality: N/A;  . Savory dilation N/A 04/07/2015    Procedure: SAVORY DILATION;  Surgeon: Teena Irani, MD;  Location: [facility] ENDOSCOPY;  Service: Endoscopy;  Laterality: N/A;  . Foreign body removal  09/2003    via upper endoscopy/notes 02/12/2011  . Esophagogastroduodenoscopy N/A 04/19/2015    Procedure: ESOPHAGOGASTRODUODENOSCOPY (EGD);  Surgeon: Arta Silence, MD;  Location: [facility] ENDOSCOPY;  Service: Endoscopy;  Laterality: N/A;  . Total knee arthroplasty Right 08/02/2015    Procedure: TOTAL KNEE ARTHROPLASTY;  Surgeon: Renette Butters, MD;  Location: [place];  Service: Orthopedics;  Laterality: Right;    reports that he has quit smoking. His smoking use included Cigarettes. He has a 25 pack-year smoking history. He quit smokeless tobacco use about 9 years ago. He reports that he drinks alcohol. He reports that he  does not use illicit drugs. family history includes Heart disease in his father and sister. Allergies  Allergen Reactions  . Cephalexin Swelling and Other (See Comments)    Tongue swelling  . Statins Other (See Comments)    Weak muscles  . Ciprofloxacin Rash   Current Outpatient Prescriptions on File Prior to Visit  Medication Sig Dispense Refill  . acetaminophen (TYLENOL) 325 MG tablet Take 1 tablet (325 mg total) by mouth every 4 (four) hours as needed for headache or mild pain.    [name] allopurinol (ZYLOPRIM) 100 MG tablet Take 1 tablet (100 mg total) by mouth daily. (Patient taking differently: Take 100 mg by mouth daily as needed (gout). ) 30 tablet 11  . calcium-vitamin D (OSCAL WITH D) 500-200 MG-UNIT per tablet Take 3 tablets by mouth 3 (three) times daily with meals.     . carvedilol (COREG) 12.5 MG tablet Take 12.5 mg by mouth See admin instructions. Take 1 tablet (12.5 mg) by mouth on Sunday, Monday, Wednesday and Friday morning,  take 1 tablet (12.5 mg) daily at 9pm    . cinacalcet (SENSIPAR) 60 MG tablet Take 60 mg by mouth 2 (two) times daily.     . colesevelam (WELCHOL) 625 MG tablet Take 3 tablets (1,875 mg total) by mouth 2 (two) times daily with a meal. 180 tablet 11  . dexlansoprazole (DEXILANT) 60 MG capsule Take 60 mg by mouth daily as needed (acid reflux).    [name] docusate sodium (COLACE) 100 MG capsule Take 1 capsule (100 mg total) by mouth 2 (two) times daily. (Patient taking differently: Take 100 mg by mouth 2 (two) times daily as needed (pain). ) 10 capsule 0  . ezetimibe (ZETIA) 10 MG tablet Take 1 tablet (10 mg total) by mouth daily. 30 tablet 6  . gabapentin (NEURONTIN) 300 MG capsule Take 300 mg by mouth at bedtime.     [name] HYDROcodone-acetaminophen (NORCO/VICODIN) 5-325 MG tablet Take 1 tablet by mouth every 6 (six) hours as needed for severe pain. 30 tablet 0  . lanthanum (FOSRENOL) 1000 MG chewable tablet Chew 1,000 mg by mouth 2 (two) times daily with a meal.     .  multivitamin (RENA-VIT) TABS tablet Take 1 tablet by mouth daily.      . nateglinide (STARLIX) 60 MG tablet Take 1 tablet (60 mg total) by mouth 3 (three) times daily with meals. 90 tablet 11   No current facility-administered medications on file prior to visit.   Review of Systems  Constitutional: Negative for unusual diaphoresis or night sweats HENT: Negative for ringing in ear or discharge Eyes: Negative for double vision or worsening visual disturbance.  Respiratory: Negative for choking and stridor.   Gastrointestinal: Negative for vomiting or other signifcant bowel change Genitourinary: Negative for hematuria or change in urine volume.  Musculoskeletal: Negative for other MSK pain or swelling Skin: Negative for color change and worsening wound.  Neurological: Negative for tremors and numbness other than noted  Psychiatric/Behavioral: Negative for decreased concentration or agitation other than above       Objective:   Physical Exam BP 154/82 mmHg  Pulse 77  Temp(Src) 98.3 F (36.8 C) (Oral)  Ht 6\' 1"  (1.854 m)  Wt 225 lb (102.059 kg)  BMI 29.69 kg/m2  SpO2 99% VS noted,  Constitutional: Pt appears in no significant distress HENT: Head: NCAT.  Right Ear: External ear normal.  Left Ear: External ear normal.  Eyes: . Pupils are equal, round, and reactive to light. Conjunctivae and EOM are normal Neck: Normal range of motion. Neck supple.  Cardiovascular: Normal rate and regular rhythm.   Pulmonary/Chest: Effort normal and breath sounds without rales or wheezing.  Abd:  Soft, NT, ND, + BS Neurological: Pt is alert. Not confused , motor grossly intact Skin: Skin is warm. No rash, no LE edema Psychiatric: Pt behavior is normal. No agitation. + depressed affect     Assessment & Plan:

## 2015-09-21 NOTE — Progress Notes (Signed)
Pre visit review using our clinic review tool, if applicable. No additional management support is needed unless otherwise documented below in the visit note. 

## 2015-09-21 NOTE — Addendum Note (Signed)
Encounter addended by: Harvie Junior, CMA on: 09/21/2015  2:33 PM<BR>     Documentation filed: Patient Instructions Section

## 2015-09-21 NOTE — Progress Notes (Signed)
ADVANCED HF CLINIC NOTE  Patient ID: [name], male   DOB: Feb 04, 1951, 64 y.o.   MRN: [id number]  PCP: [name] Nephrologist: [name]  HPI: [name] is 64 year old male with PMH: obesity, DM, COPD on nighttime oxygen, sleep apnea on CPAP, ESRD- HD, CAD, S/P Taxus drug-eluting stent to the right coronary in September [id number], chronic systolic/diastolic heart failure ECHO 6/15 EF 30% grade 2, KL (Unable to tolerate statins so placed on Welchol)  Had RHC in 2/11 which showed low pressures (PCWP = 2) , PA 26/16 (22)  and normal cardiac output.  Echo 12/12: EF 55% Myoview 10/15/12: EF 33%. LV Wall Motion: There is global hypokinesis. The LV is markedly enlarged. Small fixed apical defect.  cMRI 02/19/13: EF 37% dilated LV. diffuse HK Echo 6/15: EF 25-30%  Echo 4/16: EF 50-55%  Evaluated at [facility] for kidney transplant but he was not felt to be candidate (11/2012) due to poor mobility, DM, and coronary disease.   CPX 06/04/13   Resting HR: 76 Peak HR: 109 (70% age predicted max HR) BP rest: 107/70 BP peak: 154/69 Peak VO2: 11.8 (53.7% predicted peak VO2) - when corrected to ibw pVO2 15.7 VE/VCO2 slope: 28.8 OUES: 1.90 Peak RER: 1.06  Ve/MVV 34.7%  Follow up: Returns for f/u. Underwent R TKA on 08/02/15. Says since that time he hasn't felt well. Very weak and can't walk. Multiple complaints. Including vertigo and fatigue. Poor appetite. Seen in ER for all these complaints on 09/10/15 and work-up ok. No CP. Edema controlled. No fevers or chills.    Past Medical History  Diagnosis Date  . Hyperthyroidism   . GASTROENTERITIS, VIRAL 10/14/2009  . ONYCHOMYCOSIS, TOENAILS 12/26/2007  . GOITER, MULTINODULAR 12/26/2007  . HYPERTHYROIDISM 02/02/2010  . DIABETES MELLITUS, TYPE II 02/01/2010  . DYSLIPIDEMIA 06/18/2007  . GOUT 06/18/2007  . Hypocalcemia 06/07/2010  . DEPRESSION 10/14/2009  . PERIPHERAL NEUROPATHY 06/18/2007  . HYPERTENSION 06/18/2007  . CONGESTIVE HEART FAILURE 06/18/2007  . DIASTOLIC  HEART FAILURE, CHRONIC 02/06/2009  . Unspecified hypotension 01/30/2010  . PULMONARY NODULE, RIGHT LOWER LOBE 06/08/2009  . GERD 06/18/2007  . BENIGN PROSTATIC HYPERTROPHY 10/14/2009  . GYNECOMASTIA 07/17/2010  . NECK PAIN 07/31/2010  . FOOT PAIN 08/12/2008  . DIZZINESS 07/17/2010  . Other malaise and fatigue 11/24/2009  . GAIT DISTURBANCE 03/03/2010  . DYSPNEA 10/29/2008  . CHEST PAIN 03/29/2010  . TRANSAMINASES, SERUM, ELEVATED 02/01/2010  . COLONIC POLYPS, HX OF 10/14/2009  . Anemia 06/16/2011  . Ischemic cardiomyopathy 06/16/2011  . Hyperparathyroidism, secondary ([place]) 06/16/2011  . OSA on CPAP 10/16/2011  . Hyperlipidemia 10/16/2011  . CAD, NATIVE VESSEL 02/06/2009    saw [name] last jan 2013  . Sleep apnea     cpap machine and o2  . Allergic rhinitis, cause unspecified 02/24/2014  . Transfusion history     none recent  . Hemodialysis access, fistula mature [facility])     Dialysis T-Th-Sa ([place]) Right upper arm fistula  . ESRD (end stage renal disease) on dialysis ([place]) 08/04/2010    "TTS;  " (04/18/2015)  . Dementia [id number]  . Renal insufficiency     Current Outpatient Prescriptions  Medication Sig Dispense Refill  . acetaminophen (TYLENOL) 325 MG tablet Take 1 tablet (325 mg total) by mouth every 4 (four) hours as needed for headache or mild pain.    [name] allopurinol (ZYLOPRIM) 100 MG tablet Take 1 tablet (100 mg total) by mouth daily. (Patient taking differently: Take 100 mg by mouth  daily as needed (gout). ) 30 tablet 11  . calcium-vitamin D (OSCAL WITH D) 500-200 MG-UNIT per tablet Take 3 tablets by mouth 3 (three) times daily with meals.     . carvedilol (COREG) 12.5 MG tablet Take 12.5 mg by mouth See admin instructions. Take 1 tablet (12.5 mg) by mouth on Sunday, Monday, Wednesday and Friday morning, take 1 tablet (12.5 mg) daily at 9pm    . cinacalcet (SENSIPAR) 60 MG tablet Take 60 mg by mouth 2 (two) times daily.     . colesevelam (WELCHOL) 625 MG tablet Take 3  tablets (1,875 mg total) by mouth 2 (two) times daily with a meal. 180 tablet 11  . dexlansoprazole (DEXILANT) 60 MG capsule Take 60 mg by mouth daily as needed (acid reflux).    [name] docusate sodium (COLACE) 100 MG capsule Take 1 capsule (100 mg total) by mouth 2 (two) times daily. (Patient taking differently: Take 100 mg by mouth 2 (two) times daily as needed (pain). ) 10 capsule 0  . ezetimibe (ZETIA) 10 MG tablet Take 1 tablet (10 mg total) by mouth daily. (Patient not taking: Reported on 09/10/2015) 30 tablet 6  . gabapentin (NEURONTIN) 300 MG capsule Take 300 mg by mouth at bedtime.     [name] HYDROcodone-acetaminophen (NORCO/VICODIN) 5-325 MG tablet Take 1 tablet by mouth every 6 (six) hours as needed for severe pain. 30 tablet 0  . lanthanum (FOSRENOL) 1000 MG chewable tablet Chew 1,000 mg by mouth 2 (two) times daily with a meal.     . multivitamin (RENA-VIT) TABS tablet Take 1 tablet by mouth daily.      . nateglinide (STARLIX) 60 MG tablet Take 1 tablet (60 mg total) by mouth 3 (three) times daily with meals. 90 tablet 11   No current facility-administered medications for this encounter.    Filed Vitals:   09/21/15 1407  BP: 141/59  Pulse: 80    PHYSICAL EXAM: General:  Sitting in WC No resp difficulty Wife present HEENT: normal Neck: supple. JVP flat. Carotids 2+ bilaterally; no bruits. No lymphadenopathy or thryomegaly appreciated. Cor: PMI normal. Regular rate & rhythm. No rubs, gallops 2/6 SEM LUSB. Lungs: clear Abdomen: obese  soft, nontender, nondistended. No hepatosplenomegaly. No bruits or masses. Good bowel sounds. Extremities: no cyanosis, clubbing, rash, edema.  Neuro: alert & orientedx3, cranial nerves grossly intact. Moves all 4 extremities w/o difficulty. Affect pleasant.   ASSESSMENT & PLAN:  1. Chronic Systolic Heart Fatigue:  Mixed ICM/NICM, EF 37% per c-MRI. EF 25-30% (Echo 6/15) Most recent EF 50% in 4/16 Chronic NYHA III Volume status stable with HD. Continue  carvedilol 12.5 mg bid only on non-HD days.  Not on ACE or ARB due to ESRD. 2. HTN - improved 3. ESRD-   HD Tues/Thur/Sat 4. Obesity - recommended [place]. (if OK with Renal due to protein load) 5. CAD - stable. No evidence of ischemia. Continue ASA, b-blockers. Has not been able to tolerate statins due to myositis. Continue Zetia.  6. Fatigue and anxiety - I reviewed his recent labs and can't find anything acutely wrong with him on his bloodwork or labs. I feel he is deconditioned, depressed and anxious. Will defer to his PCP.   Follow up in 12 months  Lakrisha Iseman,MD 2:17 PM

## 2015-09-22 DIAGNOSIS — N186 End stage renal disease: Secondary | ICD-10-CM | POA: Diagnosis not present

## 2015-09-22 DIAGNOSIS — N2581 Secondary hyperparathyroidism of renal origin: Secondary | ICD-10-CM | POA: Diagnosis not present

## 2015-09-22 DIAGNOSIS — E119 Type 2 diabetes mellitus without complications: Secondary | ICD-10-CM | POA: Diagnosis not present

## 2015-09-22 DIAGNOSIS — D509 Iron deficiency anemia, unspecified: Secondary | ICD-10-CM | POA: Diagnosis not present

## 2015-09-22 DIAGNOSIS — D631 Anemia in chronic kidney disease: Secondary | ICD-10-CM | POA: Diagnosis not present

## 2015-09-23 DIAGNOSIS — I132 Hypertensive heart and chronic kidney disease with heart failure and with stage 5 chronic kidney disease, or end stage renal disease: Secondary | ICD-10-CM | POA: Diagnosis not present

## 2015-09-23 DIAGNOSIS — N186 End stage renal disease: Secondary | ICD-10-CM | POA: Diagnosis not present

## 2015-09-23 DIAGNOSIS — E1142 Type 2 diabetes mellitus with diabetic polyneuropathy: Secondary | ICD-10-CM | POA: Diagnosis not present

## 2015-09-23 DIAGNOSIS — Z471 Aftercare following joint replacement surgery: Secondary | ICD-10-CM | POA: Diagnosis not present

## 2015-09-23 DIAGNOSIS — R5383 Other fatigue: Secondary | ICD-10-CM | POA: Diagnosis not present

## 2015-09-23 DIAGNOSIS — C61 Malignant neoplasm of prostate: Secondary | ICD-10-CM | POA: Diagnosis not present

## 2015-09-23 DIAGNOSIS — I5043 Acute on chronic combined systolic (congestive) and diastolic (congestive) heart failure: Secondary | ICD-10-CM | POA: Diagnosis not present

## 2015-09-23 DIAGNOSIS — I2510 Atherosclerotic heart disease of native coronary artery without angina pectoris: Secondary | ICD-10-CM | POA: Diagnosis not present

## 2015-09-24 ENCOUNTER — Encounter: Payer: Self-pay | Admitting: Internal Medicine

## 2015-09-24 DIAGNOSIS — F329 Major depressive disorder, single episode, unspecified: Secondary | ICD-10-CM | POA: Insufficient documentation

## 2015-09-24 DIAGNOSIS — D509 Iron deficiency anemia, unspecified: Secondary | ICD-10-CM | POA: Diagnosis not present

## 2015-09-24 DIAGNOSIS — F32A Depression, unspecified: Secondary | ICD-10-CM | POA: Insufficient documentation

## 2015-09-24 DIAGNOSIS — N186 End stage renal disease: Secondary | ICD-10-CM | POA: Diagnosis not present

## 2015-09-24 DIAGNOSIS — N2581 Secondary hyperparathyroidism of renal origin: Secondary | ICD-10-CM | POA: Diagnosis not present

## 2015-09-24 DIAGNOSIS — D631 Anemia in chronic kidney disease: Secondary | ICD-10-CM | POA: Diagnosis not present

## 2015-09-24 DIAGNOSIS — E119 Type 2 diabetes mellitus without complications: Secondary | ICD-10-CM | POA: Diagnosis not present

## 2015-09-24 HISTORY — DX: Depression, unspecified: F32.A

## 2015-09-24 NOTE — Assessment & Plan Note (Addendum)
Ok for increased gabapentin 300 mg - 1-2 qhs prn, to f/u any worsening symptoms or concerns, may help with insomnia as well

## 2015-09-24 NOTE — Assessment & Plan Note (Signed)
Mild to mod, for nasonex asd, mucinex otc prn,  to f/u any worsening symptoms or concerns

## 2015-09-24 NOTE — Assessment & Plan Note (Signed)
Mod possibly severe, for celexa 10 qd,  to f/u any worsening symptoms or concerns

## 2015-09-26 NOTE — ED Provider Notes (Signed)
CSN: [id number]     Arrival date & time 09/10/15  1854 History   First MD Initiated Contact with Patient 09/10/15 1854     Chief Complaint  Patient presents with  . Generalized Body Aches     (Consider location/radiation/quality/duration/timing/severity/associated sxs/prior Treatment) HPI   64 year old male with body aches. Onset shortly before arrival. Patient has end-stage renal disease. He received dialysis earlier today. He reports a full session. Shortly after he got home he began having generalized body aches. No fever. Respiratory complaints. Denies any trauma. No rash. No recent medication changes.   Past Medical History  Diagnosis Date  . Hyperthyroidism   . GASTROENTERITIS, VIRAL 10/14/2009  . ONYCHOMYCOSIS, TOENAILS 12/26/2007  . GOITER, MULTINODULAR 12/26/2007  . HYPERTHYROIDISM 02/02/2010  . DIABETES MELLITUS, TYPE II 02/01/2010  . DYSLIPIDEMIA 06/18/2007  . GOUT 06/18/2007  . Hypocalcemia 06/07/2010  . DEPRESSION 10/14/2009  . PERIPHERAL NEUROPATHY 06/18/2007  . HYPERTENSION 06/18/2007  . CONGESTIVE HEART FAILURE 06/18/2007  . DIASTOLIC HEART FAILURE, CHRONIC 02/06/2009  . Unspecified hypotension 01/30/2010  . PULMONARY NODULE, RIGHT LOWER LOBE 06/08/2009  . GERD 06/18/2007  . BENIGN PROSTATIC HYPERTROPHY 10/14/2009  . GYNECOMASTIA 07/17/2010  . NECK PAIN 07/31/2010  . FOOT PAIN 08/12/2008  . DIZZINESS 07/17/2010  . Other malaise and fatigue 11/24/2009  . GAIT DISTURBANCE 03/03/2010  . DYSPNEA 10/29/2008  . CHEST PAIN 03/29/2010  . TRANSAMINASES, SERUM, ELEVATED 02/01/2010  . COLONIC POLYPS, HX OF 10/14/2009  . Anemia 06/16/2011  . Ischemic cardiomyopathy 06/16/2011  . Hyperparathyroidism, secondary ([place]) 06/16/2011  . OSA on CPAP 10/16/2011  . Hyperlipidemia 10/16/2011  . CAD, NATIVE VESSEL 02/06/2009    saw Dr. Missy Sabins last jan 2013  . Sleep apnea     cpap machine and o2  . Allergic rhinitis, cause unspecified 02/24/2014  . Transfusion history     none recent  . Hemodialysis  access, fistula mature [facility])     Dialysis [facility] ([place]) Right upper arm fistula  . ESRD (end stage renal disease) on dialysis ([place]) 08/04/2010    "TTS;  " (04/18/2015)  . Dementia [id number]  . Renal insufficiency   . Depression 09/24/2015   Past Surgical History  Procedure Laterality Date  . Coronary angioplasty with stent placement  06/11/2008  . Cervical spine surgery  2/09    "to repair nerve problems in my left arm"  . Back surgery  1998  . Esophagogastroduodenoscopy  09/28/2011    Procedure: ESOPHAGOGASTRODUODENOSCOPY (EGD);  Surgeon: Missy Sabins, MD;  Location: [facility] ENDOSCOPY;  Service: Endoscopy;  Laterality: N/A;  . Givens capsule study  09/30/2011    Procedure: GIVENS CAPSULE STUDY;  Surgeon: Jeryl Columbia, MD;  Location: [facility] ENDOSCOPY;  Service: Endoscopy;  Laterality: N/A;  . Cholecystectomy    . Coronary angioplasty with stent placement  06/2007    TAXUS stent to RCA/notes 01/31/2011  . Tonsillectomy    . Av fistula placement  11/07/2011    Procedure: INSERTION OF ARTERIOVENOUS (AV) GORE-TEX GRAFT ARM;  Surgeon: Tinnie Gens, MD;  Location: [place];  Service: Vascular;  Laterality: Left;  . Arteriovenous graft placement Right 2009    forearm/notes 02/01/2011  . Insertion of dialysis catheter Right 2014  . Insertion of dialysis catheter Left 02/11/2013    Procedure: INSERTION OF DIALYSIS CATHETER;  Surgeon: Conrad Olmitz, MD;  Location: [place];  Service: Vascular;  Laterality: Left;  Ultrasound guided  . Removal of a dialysis catheter Right 02/11/2013    Procedure: REMOVAL OF  A DIALYSIS CATHETER;  Surgeon: Conrad [place], MD;  Location: [place];  Service: Vascular;  Laterality: Right;  . Bascilic vein transposition Right 02/27/2013    Procedure: BASCILIC VEIN TRANSPOSITION;  Surgeon: Mal Misty, MD;  Location: [place];  Service: Vascular;  Laterality: Right;  Right Basilic Vein Transposition   . Shuntogram N/A 09/20/2011    Procedure: Earney Mallet;  Surgeon: Conrad Mellott,  MD;  Location: [facility] CATH LAB;  Service: Cardiovascular;  Laterality: N/A;  . Venogram N/A 01/26/2013    Procedure: VENOGRAM;  Surgeon: Angelia Mould, MD;  Location: [facility] CATH LAB;  Service: Cardiovascular;  Laterality: N/A;  . Esophagogastroduodenoscopy N/A 04/07/2015    Procedure: ESOPHAGOGASTRODUODENOSCOPY (EGD);  Surgeon: Teena Irani, MD;  Location: [facility] ENDOSCOPY;  Service: Endoscopy;  Laterality: N/A;  . Savory dilation N/A 04/07/2015    Procedure: SAVORY DILATION;  Surgeon: Teena Irani, MD;  Location: [facility] ENDOSCOPY;  Service: Endoscopy;  Laterality: N/A;  . Foreign body removal  09/2003    via upper endoscopy/notes 02/12/2011  . Esophagogastroduodenoscopy N/A 04/19/2015    Procedure: ESOPHAGOGASTRODUODENOSCOPY (EGD);  Surgeon: Arta Silence, MD;  Location: [facility] ENDOSCOPY;  Service: Endoscopy;  Laterality: N/A;  . Total knee arthroplasty Right 08/02/2015    Procedure: TOTAL KNEE ARTHROPLASTY;  Surgeon: Renette Butters, MD;  Location: [place];  Service: Orthopedics;  Laterality: Right;   Family History  Problem Relation Age of Onset  . Heart disease Father   . Heart disease Sister    Social History  Substance Use Topics  . Smoking status: Former Smoker -- 1.00 packs/day for 25 years    Types: Cigarettes  . Smokeless tobacco: Former Systems developer    Quit date: 10/01/2005     Comment: Quit smoking 2007 Smoked x 25 years 1/2 ppd.  . Alcohol Use: 0.0 oz/week    0 Standard drinks or equivalent per week     Comment: occassional     Review of Systems  All systems reviewed and negative, other than as noted in HPI.   Allergies  Cephalexin; Statins; and Ciprofloxacin  Home Medications   Prior to Admission medications   Medication Sig Start Date End Date Taking? Authorizing Provider  acetaminophen (TYLENOL) 325 MG tablet Take 1 tablet (325 mg total) by mouth every 4 (four) hours as needed for headache or mild pain. 08/10/15  Yes Albertine Patricia, MD  allopurinol (ZYLOPRIM) 100 MG tablet Take 1  tablet (100 mg total) by mouth daily. Patient taking differently: Take 100 mg by mouth daily as needed (gout).  06/08/15  Yes Biagio Borg, MD  calcium-vitamin D (OSCAL WITH D) 500-200 MG-UNIT per tablet Take 3 tablets by mouth 3 (three) times daily with meals.    Yes Historical Provider, MD  carvedilol (COREG) 12.5 MG tablet Take 12.5 mg by mouth See admin instructions. Take 1 tablet (12.5 mg) by mouth on Sunday, Monday, Wednesday and Friday morning, take 1 tablet (12.5 mg) daily at 9pm   Yes Historical Provider, MD  cinacalcet (SENSIPAR) 60 MG tablet Take 60 mg by mouth 2 (two) times daily.    Yes Historical Provider, MD  colesevelam [facility]) 625 MG tablet Take 3 tablets (1,875 mg total) by mouth 2 (two) times daily with a meal. 06/08/15  Yes Biagio Borg, MD  dexlansoprazole (DEXILANT) 60 MG capsule Take 60 mg by mouth daily as needed (acid reflux).   Yes Historical Provider, MD  HYDROcodone-acetaminophen (NORCO/VICODIN) 5-325 MG tablet Take 1 tablet by mouth every 6 (six) hours  as needed for severe pain. 08/10/15  Yes Albertine Patricia, MD  lanthanum (FOSRENOL) 1000 MG chewable tablet Chew 1,000 mg by mouth 2 (two) times daily with a meal.    Yes Historical Provider, MD  multivitamin (RENA-VIT) TABS tablet Take 1 tablet by mouth daily.     Yes Historical Provider, MD  nateglinide (STARLIX) 60 MG tablet Take 1 tablet (60 mg total) by mouth 3 (three) times daily with meals. 03/14/15  Yes Renato Shin, MD  amLODipine (NORVASC) 10 MG tablet  07/13/15   Historical Provider, MD  citalopram (CELEXA) 10 MG tablet Take 1 tablet (10 mg total) by mouth daily. 09/21/15   Biagio Borg, MD  docusate sodium (COLACE) 100 MG capsule Take 1 capsule (100 mg total) by mouth 2 (two) times daily. Patient taking differently: Take 100 mg by mouth 2 (two) times daily as needed (pain).  08/02/15   Brittney Claiborne Billings, PA-C  ezetimibe (ZETIA) 10 MG tablet Take 1 tablet (10 mg total) by mouth daily. 12/29/14   Jolaine Artist, MD   gabapentin (NEURONTIN) 300 MG capsule 1-2 tabs by mouth at bedtime for sleep and pain 09/21/15   Biagio Borg, MD   BP 174/86 mmHg  Pulse 73  Temp(Src) 99.2 F (37.3 C) (Rectal)  Resp 21  Ht 6\' 1"  (1.854 m)  Wt 238 lb (107.956 kg)  BMI 31.41 kg/m2  SpO2 100% Physical Exam  Constitutional: He appears well-developed and well-nourished. No distress.  HENT:  Head: Normocephalic and atraumatic.  Eyes: Conjunctivae are normal. Right eye exhibits no discharge. Left eye exhibits no discharge.  Neck: Neck supple.  Cardiovascular: Normal rate, regular rhythm and normal heart sounds.  Exam reveals no gallop and no friction rub.   No murmur heard. Pulmonary/Chest: Effort normal and breath sounds normal. No respiratory distress.  Abdominal: Soft. He exhibits no distension. There is no tenderness.  Musculoskeletal: He exhibits no edema or tenderness.  Neurological: He is alert.  Skin: Skin is warm and dry.  Psychiatric: He has a normal mood and affect. His behavior is normal. Thought content normal.  Nursing note and vitals reviewed.   ED Course  Procedures (including critical care time) Labs Review Labs Reviewed  CBC WITH DIFFERENTIAL/PLATELET - Abnormal; Notable for the following:    RBC 3.42 (*)    Hemoglobin 9.4 (*)    HCT 31.8 (*)    MCHC 29.6 (*)    RDW 21.5 (*)    All other components within normal limits  BASIC METABOLIC PANEL - Abnormal; Notable for the following:    Glucose, Bld 127 (*)    Creatinine, Ser 6.44 (*)    GFR calc non Af Amer 8 (*)    GFR calc Af Amer 9 (*)    All other components within normal limits  CULTURE, BLOOD (ROUTINE X 2)  LACTIC ACID, PLASMA    Imaging Review No results found. I have personally reviewed and evaluated these images and lab results as part of my medical decision-making.   EKG Interpretation None      MDM   Final diagnoses:  Body aches   64yM with myalgias. Afebrile. Nontoxic. HD stable. W/u fairly unremarkable in light  of known renal disease. Unsure of exact etiology but low suspicion for emergent process.It has been determined that no acute conditions requiring further emergency intervention are present at this time. The patient has been advised of the diagnosis and plan. I reviewed any labs and imaging including any potential incidental findings.  We have discussed signs and symptoms that warrant return to the ED and they are listed in the discharge instructions.        Virgel Manifold, MD 09/26/15 [phone]

## 2015-09-27 ENCOUNTER — Other Ambulatory Visit: Payer: Self-pay | Admitting: Urology

## 2015-09-27 DIAGNOSIS — C61 Malignant neoplasm of prostate: Secondary | ICD-10-CM

## 2015-09-27 DIAGNOSIS — N186 End stage renal disease: Secondary | ICD-10-CM | POA: Diagnosis not present

## 2015-09-27 DIAGNOSIS — E119 Type 2 diabetes mellitus without complications: Secondary | ICD-10-CM | POA: Diagnosis not present

## 2015-09-27 DIAGNOSIS — N2581 Secondary hyperparathyroidism of renal origin: Secondary | ICD-10-CM | POA: Diagnosis not present

## 2015-09-27 DIAGNOSIS — D631 Anemia in chronic kidney disease: Secondary | ICD-10-CM | POA: Diagnosis not present

## 2015-09-27 DIAGNOSIS — D509 Iron deficiency anemia, unspecified: Secondary | ICD-10-CM | POA: Diagnosis not present

## 2015-09-28 DIAGNOSIS — I2510 Atherosclerotic heart disease of native coronary artery without angina pectoris: Secondary | ICD-10-CM | POA: Diagnosis not present

## 2015-09-28 DIAGNOSIS — Z471 Aftercare following joint replacement surgery: Secondary | ICD-10-CM | POA: Diagnosis not present

## 2015-09-28 DIAGNOSIS — E1142 Type 2 diabetes mellitus with diabetic polyneuropathy: Secondary | ICD-10-CM | POA: Diagnosis not present

## 2015-09-28 DIAGNOSIS — N186 End stage renal disease: Secondary | ICD-10-CM | POA: Diagnosis not present

## 2015-09-28 DIAGNOSIS — I132 Hypertensive heart and chronic kidney disease with heart failure and with stage 5 chronic kidney disease, or end stage renal disease: Secondary | ICD-10-CM | POA: Diagnosis not present

## 2015-09-28 DIAGNOSIS — I5043 Acute on chronic combined systolic (congestive) and diastolic (congestive) heart failure: Secondary | ICD-10-CM | POA: Diagnosis not present

## 2015-09-29 DIAGNOSIS — E119 Type 2 diabetes mellitus without complications: Secondary | ICD-10-CM | POA: Diagnosis not present

## 2015-09-29 DIAGNOSIS — D509 Iron deficiency anemia, unspecified: Secondary | ICD-10-CM | POA: Diagnosis not present

## 2015-09-29 DIAGNOSIS — D631 Anemia in chronic kidney disease: Secondary | ICD-10-CM | POA: Diagnosis not present

## 2015-09-29 DIAGNOSIS — N186 End stage renal disease: Secondary | ICD-10-CM | POA: Diagnosis not present

## 2015-09-29 DIAGNOSIS — N2581 Secondary hyperparathyroidism of renal origin: Secondary | ICD-10-CM | POA: Diagnosis not present

## 2015-09-30 DIAGNOSIS — E1142 Type 2 diabetes mellitus with diabetic polyneuropathy: Secondary | ICD-10-CM | POA: Diagnosis not present

## 2015-09-30 DIAGNOSIS — N186 End stage renal disease: Secondary | ICD-10-CM | POA: Diagnosis not present

## 2015-09-30 DIAGNOSIS — I2510 Atherosclerotic heart disease of native coronary artery without angina pectoris: Secondary | ICD-10-CM | POA: Diagnosis not present

## 2015-09-30 DIAGNOSIS — Z471 Aftercare following joint replacement surgery: Secondary | ICD-10-CM | POA: Diagnosis not present

## 2015-09-30 DIAGNOSIS — I5043 Acute on chronic combined systolic (congestive) and diastolic (congestive) heart failure: Secondary | ICD-10-CM | POA: Diagnosis not present

## 2015-09-30 DIAGNOSIS — I132 Hypertensive heart and chronic kidney disease with heart failure and with stage 5 chronic kidney disease, or end stage renal disease: Secondary | ICD-10-CM | POA: Diagnosis not present

## 2015-10-01 DIAGNOSIS — E119 Type 2 diabetes mellitus without complications: Secondary | ICD-10-CM | POA: Diagnosis not present

## 2015-10-01 DIAGNOSIS — N186 End stage renal disease: Secondary | ICD-10-CM | POA: Diagnosis not present

## 2015-10-01 DIAGNOSIS — N2581 Secondary hyperparathyroidism of renal origin: Secondary | ICD-10-CM | POA: Diagnosis not present

## 2015-10-01 DIAGNOSIS — E1129 Type 2 diabetes mellitus with other diabetic kidney complication: Secondary | ICD-10-CM | POA: Diagnosis not present

## 2015-10-01 DIAGNOSIS — D509 Iron deficiency anemia, unspecified: Secondary | ICD-10-CM | POA: Diagnosis not present

## 2015-10-01 DIAGNOSIS — Z992 Dependence on renal dialysis: Secondary | ICD-10-CM | POA: Diagnosis not present

## 2015-10-01 DIAGNOSIS — D631 Anemia in chronic kidney disease: Secondary | ICD-10-CM | POA: Diagnosis not present

## 2015-10-04 DIAGNOSIS — D631 Anemia in chronic kidney disease: Secondary | ICD-10-CM | POA: Diagnosis not present

## 2015-10-04 DIAGNOSIS — N186 End stage renal disease: Secondary | ICD-10-CM | POA: Diagnosis not present

## 2015-10-04 DIAGNOSIS — E119 Type 2 diabetes mellitus without complications: Secondary | ICD-10-CM | POA: Diagnosis not present

## 2015-10-04 DIAGNOSIS — D509 Iron deficiency anemia, unspecified: Secondary | ICD-10-CM | POA: Diagnosis not present

## 2015-10-05 ENCOUNTER — Ambulatory Visit: Payer: Medicare Other | Admitting: Endocrinology

## 2015-10-05 DIAGNOSIS — I2510 Atherosclerotic heart disease of native coronary artery without angina pectoris: Secondary | ICD-10-CM | POA: Diagnosis not present

## 2015-10-05 DIAGNOSIS — M1711 Unilateral primary osteoarthritis, right knee: Secondary | ICD-10-CM | POA: Diagnosis not present

## 2015-10-05 DIAGNOSIS — I132 Hypertensive heart and chronic kidney disease with heart failure and with stage 5 chronic kidney disease, or end stage renal disease: Secondary | ICD-10-CM | POA: Diagnosis not present

## 2015-10-05 DIAGNOSIS — I5043 Acute on chronic combined systolic (congestive) and diastolic (congestive) heart failure: Secondary | ICD-10-CM | POA: Diagnosis not present

## 2015-10-05 DIAGNOSIS — N186 End stage renal disease: Secondary | ICD-10-CM | POA: Diagnosis not present

## 2015-10-05 DIAGNOSIS — E1142 Type 2 diabetes mellitus with diabetic polyneuropathy: Secondary | ICD-10-CM | POA: Diagnosis not present

## 2015-10-05 DIAGNOSIS — Z471 Aftercare following joint replacement surgery: Secondary | ICD-10-CM | POA: Diagnosis not present

## 2015-10-05 DIAGNOSIS — Z96651 Presence of right artificial knee joint: Secondary | ICD-10-CM | POA: Diagnosis not present

## 2015-10-06 DIAGNOSIS — N186 End stage renal disease: Secondary | ICD-10-CM | POA: Diagnosis not present

## 2015-10-06 DIAGNOSIS — D509 Iron deficiency anemia, unspecified: Secondary | ICD-10-CM | POA: Diagnosis not present

## 2015-10-06 DIAGNOSIS — D631 Anemia in chronic kidney disease: Secondary | ICD-10-CM | POA: Diagnosis not present

## 2015-10-06 DIAGNOSIS — E119 Type 2 diabetes mellitus without complications: Secondary | ICD-10-CM | POA: Diagnosis not present

## 2015-10-07 DIAGNOSIS — N186 End stage renal disease: Secondary | ICD-10-CM | POA: Diagnosis not present

## 2015-10-07 DIAGNOSIS — E1142 Type 2 diabetes mellitus with diabetic polyneuropathy: Secondary | ICD-10-CM | POA: Diagnosis not present

## 2015-10-07 DIAGNOSIS — Z471 Aftercare following joint replacement surgery: Secondary | ICD-10-CM | POA: Diagnosis not present

## 2015-10-07 DIAGNOSIS — I132 Hypertensive heart and chronic kidney disease with heart failure and with stage 5 chronic kidney disease, or end stage renal disease: Secondary | ICD-10-CM | POA: Diagnosis not present

## 2015-10-07 DIAGNOSIS — I5043 Acute on chronic combined systolic (congestive) and diastolic (congestive) heart failure: Secondary | ICD-10-CM | POA: Diagnosis not present

## 2015-10-07 DIAGNOSIS — I2510 Atherosclerotic heart disease of native coronary artery without angina pectoris: Secondary | ICD-10-CM | POA: Diagnosis not present

## 2015-10-08 DIAGNOSIS — N186 End stage renal disease: Secondary | ICD-10-CM | POA: Diagnosis not present

## 2015-10-08 DIAGNOSIS — D509 Iron deficiency anemia, unspecified: Secondary | ICD-10-CM | POA: Diagnosis not present

## 2015-10-08 DIAGNOSIS — D631 Anemia in chronic kidney disease: Secondary | ICD-10-CM | POA: Diagnosis not present

## 2015-10-08 DIAGNOSIS — E119 Type 2 diabetes mellitus without complications: Secondary | ICD-10-CM | POA: Diagnosis not present

## 2015-10-11 DIAGNOSIS — E119 Type 2 diabetes mellitus without complications: Secondary | ICD-10-CM | POA: Diagnosis not present

## 2015-10-11 DIAGNOSIS — D509 Iron deficiency anemia, unspecified: Secondary | ICD-10-CM | POA: Diagnosis not present

## 2015-10-11 DIAGNOSIS — N186 End stage renal disease: Secondary | ICD-10-CM | POA: Diagnosis not present

## 2015-10-11 DIAGNOSIS — D631 Anemia in chronic kidney disease: Secondary | ICD-10-CM | POA: Diagnosis not present

## 2015-10-12 DIAGNOSIS — E1142 Type 2 diabetes mellitus with diabetic polyneuropathy: Secondary | ICD-10-CM | POA: Diagnosis not present

## 2015-10-12 DIAGNOSIS — Z471 Aftercare following joint replacement surgery: Secondary | ICD-10-CM | POA: Diagnosis not present

## 2015-10-12 DIAGNOSIS — I2510 Atherosclerotic heart disease of native coronary artery without angina pectoris: Secondary | ICD-10-CM | POA: Diagnosis not present

## 2015-10-12 DIAGNOSIS — I5043 Acute on chronic combined systolic (congestive) and diastolic (congestive) heart failure: Secondary | ICD-10-CM | POA: Diagnosis not present

## 2015-10-12 DIAGNOSIS — I132 Hypertensive heart and chronic kidney disease with heart failure and with stage 5 chronic kidney disease, or end stage renal disease: Secondary | ICD-10-CM | POA: Diagnosis not present

## 2015-10-12 DIAGNOSIS — N186 End stage renal disease: Secondary | ICD-10-CM | POA: Diagnosis not present

## 2015-10-13 DIAGNOSIS — E119 Type 2 diabetes mellitus without complications: Secondary | ICD-10-CM | POA: Diagnosis not present

## 2015-10-13 DIAGNOSIS — D631 Anemia in chronic kidney disease: Secondary | ICD-10-CM | POA: Diagnosis not present

## 2015-10-13 DIAGNOSIS — N186 End stage renal disease: Secondary | ICD-10-CM | POA: Diagnosis not present

## 2015-10-13 DIAGNOSIS — D509 Iron deficiency anemia, unspecified: Secondary | ICD-10-CM | POA: Diagnosis not present

## 2015-10-14 ENCOUNTER — Other Ambulatory Visit: Payer: Self-pay | Admitting: Internal Medicine

## 2015-10-14 ENCOUNTER — Encounter (HOSPITAL_COMMUNITY)
Admission: RE | Admit: 2015-10-14 | Discharge: 2015-10-14 | Disposition: A | Discharge status: Home / Self Care | Payer: Medicare Other | Source: Ambulatory Visit | Attending: Urology | Admitting: Urology

## 2015-10-14 DIAGNOSIS — N200 Calculus of kidney: Secondary | ICD-10-CM | POA: Diagnosis not present

## 2015-10-14 DIAGNOSIS — C61 Malignant neoplasm of prostate: Secondary | ICD-10-CM | POA: Diagnosis not present

## 2015-10-14 MED ORDER — TECHNETIUM TC 99M MEDRONATE IV KIT
25.0000 | PACK | Freq: Once | INTRAVENOUS | Status: AC | PRN
  Administered 2015-10-14: 25 via INTRAVENOUS

## 2015-10-15 DIAGNOSIS — E119 Type 2 diabetes mellitus without complications: Secondary | ICD-10-CM | POA: Diagnosis not present

## 2015-10-15 DIAGNOSIS — D631 Anemia in chronic kidney disease: Secondary | ICD-10-CM | POA: Diagnosis not present

## 2015-10-15 DIAGNOSIS — N186 End stage renal disease: Secondary | ICD-10-CM | POA: Diagnosis not present

## 2015-10-15 DIAGNOSIS — D509 Iron deficiency anemia, unspecified: Secondary | ICD-10-CM | POA: Diagnosis not present

## 2015-10-17 DIAGNOSIS — M25661 Stiffness of right knee, not elsewhere classified: Secondary | ICD-10-CM | POA: Diagnosis not present

## 2015-10-17 DIAGNOSIS — M6281 Muscle weakness (generalized): Secondary | ICD-10-CM | POA: Diagnosis not present

## 2015-10-17 DIAGNOSIS — Z471 Aftercare following joint replacement surgery: Secondary | ICD-10-CM | POA: Diagnosis not present

## 2015-10-17 DIAGNOSIS — M25561 Pain in right knee: Secondary | ICD-10-CM | POA: Diagnosis not present

## 2015-10-17 DIAGNOSIS — E1142 Type 2 diabetes mellitus with diabetic polyneuropathy: Secondary | ICD-10-CM | POA: Diagnosis not present

## 2015-10-17 DIAGNOSIS — I2510 Atherosclerotic heart disease of native coronary artery without angina pectoris: Secondary | ICD-10-CM | POA: Diagnosis not present

## 2015-10-17 DIAGNOSIS — I5043 Acute on chronic combined systolic (congestive) and diastolic (congestive) heart failure: Secondary | ICD-10-CM | POA: Diagnosis not present

## 2015-10-17 DIAGNOSIS — I132 Hypertensive heart and chronic kidney disease with heart failure and with stage 5 chronic kidney disease, or end stage renal disease: Secondary | ICD-10-CM | POA: Diagnosis not present

## 2015-10-17 DIAGNOSIS — N186 End stage renal disease: Secondary | ICD-10-CM | POA: Diagnosis not present

## 2015-10-17 DIAGNOSIS — R262 Difficulty in walking, not elsewhere classified: Secondary | ICD-10-CM | POA: Diagnosis not present

## 2015-10-18 DIAGNOSIS — N186 End stage renal disease: Secondary | ICD-10-CM | POA: Diagnosis not present

## 2015-10-18 DIAGNOSIS — E119 Type 2 diabetes mellitus without complications: Secondary | ICD-10-CM | POA: Diagnosis not present

## 2015-10-18 DIAGNOSIS — D631 Anemia in chronic kidney disease: Secondary | ICD-10-CM | POA: Diagnosis not present

## 2015-10-18 DIAGNOSIS — D509 Iron deficiency anemia, unspecified: Secondary | ICD-10-CM | POA: Diagnosis not present

## 2015-10-20 DIAGNOSIS — D509 Iron deficiency anemia, unspecified: Secondary | ICD-10-CM | POA: Diagnosis not present

## 2015-10-20 DIAGNOSIS — N186 End stage renal disease: Secondary | ICD-10-CM | POA: Diagnosis not present

## 2015-10-20 DIAGNOSIS — D631 Anemia in chronic kidney disease: Secondary | ICD-10-CM | POA: Diagnosis not present

## 2015-10-20 DIAGNOSIS — E119 Type 2 diabetes mellitus without complications: Secondary | ICD-10-CM | POA: Diagnosis not present

## 2015-10-21 ENCOUNTER — Encounter: Payer: Self-pay | Admitting: Radiation Oncology

## 2015-10-21 ENCOUNTER — Telehealth: Payer: Self-pay | Admitting: Endocrinology

## 2015-10-21 DIAGNOSIS — R140 Abdominal distension (gaseous): Secondary | ICD-10-CM | POA: Diagnosis not present

## 2015-10-21 DIAGNOSIS — R109 Unspecified abdominal pain: Secondary | ICD-10-CM | POA: Diagnosis not present

## 2015-10-21 NOTE — Telephone Encounter (Signed)
please call patient: Ov is due 

## 2015-10-21 NOTE — Progress Notes (Signed)
GU Location of Tumor / Histology: prostatic adenocarcinoma  If Prostate Cancer, Gleason Score is (4 + 3) and PSA is (5.6)  [name] presented for follow up with Dr. Alyson Ingles with known BPH and elevated PSA. PSA 2015 3.78 then, PSA 09/23/15 5.65  Biopsies of prostate (if applicable) revealed:    Past/Anticipated interventions by urology, if any: prostate biopsy, stressed importance of life long biochemical surveillance even after definitive therapy and referral to Dr. Tammi Klippel to discuss radiation therapy vs active surveillance  Past/Anticipated interventions by medical oncology, if any: no  Weight changes, if any: lost approximately 20 lb after total right knee replacement while in rehab  Bowel/Bladder complaints, if any:  He has ESRD and make only a small amount of urine daily; he rarely voids. Denies hematuria or dysuria. Has been on dialysis for six years.  Nausea/Vomiting, if any: no  Pain issues, if any:  no  SAFETY ISSUES:  Prior radiation? no  Pacemaker/ICD? no  Possible current pregnancy? no  Is the patient on methotrexate? no  Current Complaints / other details:  65 year old male. Married with three children. Prostate volume: 46 cc. Also, reports fatigue and SOB. Denies family history of breast or prostate cancer.

## 2015-10-22 DIAGNOSIS — E119 Type 2 diabetes mellitus without complications: Secondary | ICD-10-CM | POA: Diagnosis not present

## 2015-10-22 DIAGNOSIS — D509 Iron deficiency anemia, unspecified: Secondary | ICD-10-CM | POA: Diagnosis not present

## 2015-10-22 DIAGNOSIS — D631 Anemia in chronic kidney disease: Secondary | ICD-10-CM | POA: Diagnosis not present

## 2015-10-22 DIAGNOSIS — N186 End stage renal disease: Secondary | ICD-10-CM | POA: Diagnosis not present

## 2015-10-24 ENCOUNTER — Ambulatory Visit: Payer: Medicare Other | Admitting: Neurology

## 2015-10-24 ENCOUNTER — Ambulatory Visit
Admission: RE | Admit: 2015-10-24 | Discharge: 2015-10-24 | Disposition: A | Discharge status: Home / Self Care | Payer: Medicare Other | Source: Ambulatory Visit | Attending: Radiation Oncology | Admitting: Radiation Oncology

## 2015-10-24 ENCOUNTER — Telehealth: Payer: Self-pay | Admitting: *Deleted

## 2015-10-24 ENCOUNTER — Encounter: Payer: Self-pay | Admitting: Radiation Oncology

## 2015-10-24 VITALS — BP 136/63 | HR 78 | Resp 16 | Ht 73.0 in | Wt 228.2 lb

## 2015-10-24 DIAGNOSIS — M6281 Muscle weakness (generalized): Secondary | ICD-10-CM | POA: Diagnosis not present

## 2015-10-24 DIAGNOSIS — E785 Hyperlipidemia, unspecified: Secondary | ICD-10-CM | POA: Diagnosis not present

## 2015-10-24 DIAGNOSIS — N289 Disorder of kidney and ureter, unspecified: Secondary | ICD-10-CM | POA: Insufficient documentation

## 2015-10-24 DIAGNOSIS — Z992 Dependence on renal dialysis: Secondary | ICD-10-CM | POA: Diagnosis not present

## 2015-10-24 DIAGNOSIS — Z510 Encounter for antineoplastic radiation therapy: Secondary | ICD-10-CM | POA: Diagnosis not present

## 2015-10-24 DIAGNOSIS — Z841 Family history of disorders of kidney and ureter: Secondary | ICD-10-CM | POA: Insufficient documentation

## 2015-10-24 DIAGNOSIS — Z87891 Personal history of nicotine dependence: Secondary | ICD-10-CM | POA: Insufficient documentation

## 2015-10-24 DIAGNOSIS — M109 Gout, unspecified: Secondary | ICD-10-CM | POA: Insufficient documentation

## 2015-10-24 DIAGNOSIS — E0590 Thyrotoxicosis, unspecified without thyrotoxic crisis or storm: Secondary | ICD-10-CM | POA: Diagnosis not present

## 2015-10-24 DIAGNOSIS — I2510 Atherosclerotic heart disease of native coronary artery without angina pectoris: Secondary | ICD-10-CM | POA: Insufficient documentation

## 2015-10-24 DIAGNOSIS — C61 Malignant neoplasm of prostate: Secondary | ICD-10-CM

## 2015-10-24 DIAGNOSIS — Z8249 Family history of ischemic heart disease and other diseases of the circulatory system: Secondary | ICD-10-CM | POA: Diagnosis not present

## 2015-10-24 DIAGNOSIS — Z833 Family history of diabetes mellitus: Secondary | ICD-10-CM | POA: Insufficient documentation

## 2015-10-24 DIAGNOSIS — E1122 Type 2 diabetes mellitus with diabetic chronic kidney disease: Secondary | ICD-10-CM | POA: Insufficient documentation

## 2015-10-24 DIAGNOSIS — B351 Tinea unguium: Secondary | ICD-10-CM | POA: Diagnosis not present

## 2015-10-24 DIAGNOSIS — M25561 Pain in right knee: Secondary | ICD-10-CM | POA: Diagnosis not present

## 2015-10-24 DIAGNOSIS — R42 Dizziness and giddiness: Secondary | ICD-10-CM | POA: Diagnosis not present

## 2015-10-24 DIAGNOSIS — K219 Gastro-esophageal reflux disease without esophagitis: Secondary | ICD-10-CM | POA: Insufficient documentation

## 2015-10-24 DIAGNOSIS — F329 Major depressive disorder, single episode, unspecified: Secondary | ICD-10-CM | POA: Diagnosis not present

## 2015-10-24 DIAGNOSIS — N62 Hypertrophy of breast: Secondary | ICD-10-CM | POA: Insufficient documentation

## 2015-10-24 DIAGNOSIS — E119 Type 2 diabetes mellitus without complications: Secondary | ICD-10-CM | POA: Diagnosis not present

## 2015-10-24 DIAGNOSIS — F0390 Unspecified dementia without behavioral disturbance: Secondary | ICD-10-CM | POA: Insufficient documentation

## 2015-10-24 DIAGNOSIS — Z809 Family history of malignant neoplasm, unspecified: Secondary | ICD-10-CM | POA: Diagnosis not present

## 2015-10-24 DIAGNOSIS — Z951 Presence of aortocoronary bypass graft: Secondary | ICD-10-CM | POA: Diagnosis not present

## 2015-10-24 DIAGNOSIS — I5032 Chronic diastolic (congestive) heart failure: Secondary | ICD-10-CM | POA: Diagnosis not present

## 2015-10-24 DIAGNOSIS — Z955 Presence of coronary angioplasty implant and graft: Secondary | ICD-10-CM | POA: Insufficient documentation

## 2015-10-24 DIAGNOSIS — E1142 Type 2 diabetes mellitus with diabetic polyneuropathy: Secondary | ICD-10-CM | POA: Diagnosis not present

## 2015-10-24 DIAGNOSIS — I132 Hypertensive heart and chronic kidney disease with heart failure and with stage 5 chronic kidney disease, or end stage renal disease: Secondary | ICD-10-CM | POA: Insufficient documentation

## 2015-10-24 DIAGNOSIS — G4733 Obstructive sleep apnea (adult) (pediatric): Secondary | ICD-10-CM | POA: Diagnosis not present

## 2015-10-24 DIAGNOSIS — M25661 Stiffness of right knee, not elsewhere classified: Secondary | ICD-10-CM | POA: Diagnosis not present

## 2015-10-24 DIAGNOSIS — R269 Unspecified abnormalities of gait and mobility: Secondary | ICD-10-CM | POA: Insufficient documentation

## 2015-10-24 DIAGNOSIS — R262 Difficulty in walking, not elsewhere classified: Secondary | ICD-10-CM | POA: Diagnosis not present

## 2015-10-24 DIAGNOSIS — N186 End stage renal disease: Secondary | ICD-10-CM | POA: Insufficient documentation

## 2015-10-24 DIAGNOSIS — R911 Solitary pulmonary nodule: Secondary | ICD-10-CM | POA: Insufficient documentation

## 2015-10-24 HISTORY — DX: Malignant neoplasm of prostate: C61

## 2015-10-24 NOTE — Telephone Encounter (Signed)
Called patient to inform of gold seed placement on 11/25/15 @ 9:30 am @ Dr. Noland Fordyce Office and his sim @ Dr. Johny Shears Office on 12-02-15 @ 2 pm, no answer will call later

## 2015-10-24 NOTE — Progress Notes (Signed)
See progress note under physician encounter. 

## 2015-10-24 NOTE — Telephone Encounter (Signed)
Appointment letter mailed the pt.

## 2015-10-24 NOTE — Progress Notes (Signed)
Please see the Nurse Progress Note in the MD Initial Consult Encounter for this patient. 

## 2015-10-24 NOTE — Progress Notes (Signed)
Radiation Oncology         [phone] ________________________________  Initial Outpatient Consultation  Name: [name] MRN: [id number]  Date: 10/24/2015  DOB: 14-Jan-1951  [id number] [name], MD  McKenzie, Candee Furbish, MD   REFERRING PHYSICIAN: Cleon Gustin, MD  DIAGNOSIS: 65 y.o. gentleman with stage T1c adenocarcinoma of the prostate with a Gleason's score of 4+3 and a PSA of 5.65    ICD-9-CM ICD-10-CM   1. Malignant neoplasm of prostate ([name]) [name] is a 65 y.o. gentleman seen at the request of Dr. Noah Delaine diagnosis of prostate cancer. The patient was found to have an elevated PSA of 5.65 on 09/26/2015. He has been followed by Dr. Noah Delaine for BPH as well. During his workup he had a normal DRE, and his prostatic volume was approximately 40 cc on exam, and 46 cc on ultrasound. He underwent a biopsies of the prostate on 09/23/2015 revealing adenocarcinoma in one of 12 biopsy sites with a Gleason score of 4+3. He has been counseled by Dr. Noah Delaine on T1c, intermediate risk prostate cancer, and the role of active surveillance, surgical intervention, or consideration of radiotherapy. He comes to discuss his options further with Dr. Tammi Klippel.  The patient reviewed the biopsy results with his urologist and he has kindly been referred today for discussion of potential radiation treatment options.  He gets dialysis on Tuesdays, Thursday, and Saturdays from 11-4.  PREVIOUS RADIATION THERAPY: No  PAST MEDICAL HISTORY:  has a past medical history of Hyperthyroidism; GASTROENTERITIS, VIRAL (10/14/2009); ONYCHOMYCOSIS, TOENAILS (12/26/2007); GOITER, MULTINODULAR (12/26/2007); HYPERTHYROIDISM (02/02/2010); DIABETES MELLITUS, TYPE II (02/01/2010); DYSLIPIDEMIA (06/18/2007); GOUT (06/18/2007); Hypocalcemia (06/07/2010); DEPRESSION (10/14/2009); PERIPHERAL NEUROPATHY (06/18/2007); HYPERTENSION (06/18/2007); CONGESTIVE HEART FAILURE ([id number]); DIASTOLIC HEART  FAILURE, CHRONIC (02/06/2009); Unspecified hypotension (01/30/2010); PULMONARY NODULE, RIGHT LOWER LOBE (06/08/2009); GERD (06/18/2007); BENIGN PROSTATIC HYPERTROPHY (10/14/2009); GYNECOMASTIA (07/17/2010); NECK PAIN (07/31/2010); FOOT PAIN (08/12/2008); DIZZINESS (07/17/2010); Other malaise and fatigue (11/24/2009); GAIT DISTURBANCE (03/03/2010); DYSPNEA (10/29/2008); CHEST PAIN (03/29/2010); TRANSAMINASES, SERUM, ELEVATED (02/01/2010); COLONIC POLYPS, HX OF (10/14/2009); Anemia (06/16/2011); Ischemic cardiomyopathy (06/16/2011); Hyperparathyroidism, secondary ([place]) (06/16/2011); OSA on CPAP (10/16/2011); Hyperlipidemia (10/16/2011); CAD, NATIVE VESSEL (02/06/2009); Sleep apnea; Allergic rhinitis, cause unspecified (02/24/2014); Transfusion history; Hemodialysis access, fistula mature ([place]); ESRD (end stage renal disease) on dialysis ([place]) (08/04/2010); Dementia ([id number]); Renal insufficiency; Depression (09/24/2015); and Prostate cancer ([place]).    PAST SURGICAL HISTORY: Past Surgical History  Procedure Laterality Date  . Coronary angioplasty with stent placement  06/11/2008  . Cervical spine surgery  2/09    "to repair nerve problems in my left arm"  . Back surgery  1998  . Esophagogastroduodenoscopy  09/28/2011    Procedure: ESOPHAGOGASTRODUODENOSCOPY (EGD);  Surgeon: Missy Sabins, MD;  Location: [facility] ENDOSCOPY;  Service: Endoscopy;  Laterality: N/A;  . Givens capsule study  09/30/2011    Procedure: GIVENS CAPSULE STUDY;  Surgeon: Jeryl Columbia, MD;  Location: [facility] ENDOSCOPY;  Service: Endoscopy;  Laterality: N/A;  . Cholecystectomy    . Coronary angioplasty with stent placement  06/2007    TAXUS stent to RCA/notes 01/31/2011  . Tonsillectomy    . Av fistula placement  11/07/2011    Procedure: INSERTION OF ARTERIOVENOUS (AV) GORE-TEX GRAFT ARM;  Surgeon: Tinnie Gens, MD;  Location: [place];  Service: Vascular;  Laterality: Left;  . Arteriovenous graft placement Right 2009    forearm/notes 02/01/2011  . Insertion of dialysis  catheter Right 2014  . Insertion of dialysis catheter Left 02/11/2013  Procedure: INSERTION OF DIALYSIS CATHETER;  Surgeon: Conrad [place], MD;  Location: [place];  Service: Vascular;  Laterality: Left;  Ultrasound guided  . Removal of a dialysis catheter Right 02/11/2013    Procedure: REMOVAL OF A DIALYSIS CATHETER;  Surgeon: Conrad [place]side, MD;  Location: [place];  Service: Vascular;  Laterality: Right;  . Bascilic vein transposition Right 02/27/2013    Procedure: BASCILIC VEIN TRANSPOSITION;  Surgeon: Mal Misty, MD;  Location: [place];  Service: Vascular;  Laterality: Right;  Right Basilic Vein Transposition   . Shuntogram N/A 09/20/2011    Procedure: Earney Mallet;  Surgeon: Conrad [place], MD;  Location[facility] CATH LAB;  Service: Cardiovascular;  Laterality: N/A;  . Venogram N/A 01/26/2013    Procedure: VENOGRAM;  Surgeon: Angelia Mould, MD;  Location: [facility] CATH LAB;  Service: Cardiovascular;  Laterality: N/A;  . Esophagogastroduodenoscopy N/A 04/07/2015    Procedure: ESOPHAGOGASTRODUODENOSCOPY (EGD);  Surgeon: Teena Irani, MD;  Location: [facility] ENDOSCOPY;  Service: Endoscopy;  Laterality: N/A;  . Savory dilation N/A 04/07/2015    Procedure: SAVORY DILATION;  Surgeon: Teena Irani, MD;  Location: [facility] ENDOSCOPY;  Service: Endoscopy;  Laterality: N/A;  . Foreign body removal  09/2003    via upper endoscopy/notes 02/12/2011  . Esophagogastroduodenoscopy N/A 04/19/2015    Procedure: ESOPHAGOGASTRODUODENOSCOPY (EGD);  Surgeon: Arta Silence, MD;  Location: [facility] ENDOSCOPY;  Service: Endoscopy;  Laterality: N/A;  . Total knee arthroplasty Right 08/02/2015    Procedure: TOTAL KNEE ARTHROPLASTY;  Surgeon: Renette Butters, MD;  Location: [place];  Service: Orthopedics;  Laterality: Right;    FAMILY HISTORY: family history includes Diabetes in his father; Heart disease in his father and sister; Hypertension in his father; Kidney failure in his father. There is no history of Cancer.  SOCIAL HISTORY:  reports that he quit  smoking about 10 years ago. His smoking use included Cigarettes. He has a 25 pack-year smoking history. He quit smokeless tobacco use about 10 years ago. He reports that he drinks alcohol. He reports that he does not use illicit drugs.  ALLERGIES: Cephalexin; Statins; and Ciprofloxacin  MEDICATIONS:  Current Outpatient Prescriptions  Medication Sig Dispense Refill  . acetaminophen (TYLENOL) 325 MG tablet Take 1 tablet (325 mg total) by mouth every 4 (four) hours as needed for headache or mild pain.    [name] allopurinol (ZYLOPRIM) 100 MG tablet Take 1 tablet (100 mg total) by mouth daily. (Patient taking differently: Take 100 mg by mouth daily as needed (gout). ) 30 tablet 11  . amLODipine (NORVASC) 10 MG tablet     . calcium-vitamin D (OSCAL WITH D) 500-200 MG-UNIT per tablet Take 3 tablets by mouth 3 (three) times daily with meals.     . carvedilol (COREG) 12.5 MG tablet Take 12.5 mg by mouth See admin instructions. Take 1 tablet (12.5 mg) by mouth on Sunday, Monday, Wednesday and Friday morning, take 1 tablet (12.5 mg) daily at 9pm    . cinacalcet (SENSIPAR) 60 MG tablet Take 60 mg by mouth 2 (two) times daily.     . citalopram (CELEXA) 10 MG tablet Take 1 tablet (10 mg total) by mouth daily. 90 tablet 3  . colesevelam (WELCHOL) 625 MG tablet Take 3 tablets (1,875 mg total) by mouth 2 (two) times daily with a meal. 180 tablet 11  . dexlansoprazole (DEXILANT) 60 MG capsule Take 60 mg by mouth daily as needed (acid reflux).    [name] docusate sodium (COLACE) 100 MG capsule Take 1 capsule (100  mg total) by mouth 2 (two) times daily. (Patient taking differently: Take 100 mg by mouth 2 (two) times daily as needed (pain). ) 10 capsule 0  . ezetimibe (ZETIA) 10 MG tablet Take 1 tablet (10 mg total) by mouth daily. 30 tablet 6  . gabapentin (NEURONTIN) 300 MG capsule 1-2 tabs by mouth at bedtime for sleep and pain 180 capsule 3  . HYDROcodone-acetaminophen (NORCO/VICODIN) 5-325 MG tablet Take 1 tablet by mouth  every 6 (six) hours as needed for severe pain. 30 tablet 0  . lanthanum (FOSRENOL) 1000 MG chewable tablet Chew 1,000 mg by mouth 2 (two) times daily with a meal.     . nateglinide (STARLIX) 60 MG tablet Take 1 tablet (60 mg total) by mouth 3 (three) times daily with meals. 90 tablet 11  . Multiple Vitamins-Minerals (MULTIVITAMIN WITH MINERALS) tablet Take by mouth. Reported on 10/24/2015    . multivitamin (RENA-VIT) TABS tablet Take 1 tablet by mouth daily. Reported on 10/24/2015     No current facility-administered medications for this encounter.    REVIEW OF SYSTEMS:  A 15 point review of systems is documented in the electronic medical record. This was obtained by the nursing staff. However, I reviewed this with the patient to discuss relevant findings and make appropriate changes.  Pertinent items are noted in HPI..  The patient completed an IPSS and IIEF questionnaire.  His IPSS score was 0 indicating mild urinary outflow obstructive symptoms.  He indicated that his erectile function is able to complete sexual activity almost always.   PHYSICAL EXAM: This patient is in no acute distress.  He is alert and oriented.   height is 6\' 1"  (1.854 m) and weight is 228 lb 3.2 oz (103.511 kg). His blood pressure is 136/63 and his pulse is 78. His respiration is 16 and oxygen saturation is 100%.  He exhibits no respiratory distress or labored breathing.  He appears neurologically intact.  His mood is pleasant.  His affect is appropriate.  Please note the digital rectal exam findings described above.  KPS = 90  100 - Normal; no complaints; no evidence of disease. 90   - Able to carry on normal activity; minor signs or symptoms of disease. 80   - Normal activity with effort; some signs or symptoms of disease. 50   - Cares for self; unable to carry on normal activity or to do active work. 60   - Requires occasional assistance, but is able to care for most of his personal needs. 50   - Requires considerable  assistance and frequent medical care. 30   - Disabled; requires special care and assistance. 63   - Severely disabled; hospital admission is indicated although death not imminent. 54   - Very sick; hospital admission necessary; active supportive treatment necessary. 10   - Moribund; fatal processes progressing rapidly. 0     - Dead  Karnofsky DA, Abelmann [place], Craver LS and Burchenal [facility] [phone]) The use of the nitrogen mustards in the palliative treatment of carcinoma: with particular reference to bronchogenic carcinoma Cancer 1 634-56   LABORATORY DATA:  Lab Results  Component Value Date   WBC 5.4 09/10/2015   HGB 9.4* 09/10/2015   HCT 31.8* 09/10/2015   MCV 93.0 09/10/2015   PLT 260 09/10/2015   Lab Results  Component Value Date   NA 142 09/10/2015   K 3.6 09/10/2015   CL 101 09/10/2015   CO2 31 09/10/2015   Lab Results  Component Value  Date   ALT 33 08/10/2015   AST 42* 08/10/2015   ALKPHOS 79 08/10/2015   BILITOT 1.1 08/10/2015     RADIOGRAPHY: Nm Bone Scan Whole Body  10/14/2015  CLINICAL DATA:  Prostate cancer, PSA level 5.65. EXAM: NUCLEAR MEDICINE WHOLE BODY BONE SCAN TECHNIQUE: Whole body anterior and posterior images were obtained approximately 3 hours after intravenous injection of radiopharmaceutical. RADIOPHARMACEUTICALS:  26.8 mCi Technetium-33m MDP IV COMPARISON:  Multiple exams, including CT scan of 10/14/2015 and 08/10/2015 FINDINGS: Evidence of recent total knee arthroplasty, right knee, with photopenia from the prosthesis and adjacent high activity from normal reaction. There is also high activity in the left knee suggesting prominent osteoarthritis. Vaguely increased activity in the lower thoracic and lumbar spine compatible with the patient's prior operative intervention scanned the generalized considerable spondylosis and degenerative endplate findings. On the CT scan from 10/14/2015 the appearance is not considered characteristic for osseous metastatic disease  in the lumbar spine or bony pelvis. Degenerative findings in the shoulders and right greater the left sternoclavicular joints. IMPRESSION: 1. No findings of osseous metastatic disease. 2. Postoperative findings along the right knee. Degenerative findings in the thoracolumbar spine and left knee, as well as the shoulders and sternoclavicular joints. Electronically Signed   By: Van Clines M.D.   On: 10/14/2015 14:00      IMPRESSION: This gentleman is a 65 yo male with adenocarcinoma of the prostate with a Gleason score of 4+3 and a PSA of 5.65.  His Gleason's Score puts him into the intermediate group.  Accordingly he is eligible for a variety of potential treatment options including seed external beam radiaiton. He is not an ideal candidate for surgery due to comorbidities.  PLAN: Today I reviewed the findings and workup thus far.  We discussed the natural history of prostate cancer.  We reviewed the the implications of T-stage, Gleason's Score, and PSA on decision-making and outcomes in prostate cancer.  We discussed radiation treatment in the management of prostate cancer with regard to the logistics and delivery of external beam radiation treatment as well as the logistics and delivery of prostate brachytherapy.  We compared and contrasted each of these approaches and also compared these against prostatectomy.  The patient expressed interest in external beam radiotherapy.  I filled out a patient counseling form for him with relevant treatment diagrams and we retained a copy for our records.   The patient would like to proceed with prostate IMRT.  I will share my findings with Dr. Alyson Ingles and move forward with scheduling placement of three gold fiducial markers into the prostate to proceed with IMRT in the near future. He has an appointment with Dr. Alyson Ingles 11/18/2015.  We'll have to work around his dialysis schedule.  I enjoyed meeting with him today, and will look forward to participating in  the care of this very nice gentleman.   I spent 40 minutes face to face with the patient and more than 50% of that time was spent in counseling and/or coordination of care.   ------------------------------------------------  Sheral Apley. Tammi Klippel, M.D.    This document serves as a record of services personally performed by Tyler Pita, MD. It was created on his behalf by Lendon Collar, a trained medical scribe. The creation of this record is based on the scribe's personal observations and the provider's statements to them. This document has been checked and approved by the attending provider.

## 2015-10-25 ENCOUNTER — Telehealth: Payer: Self-pay | Admitting: *Deleted

## 2015-10-25 ENCOUNTER — Telehealth (HOSPITAL_COMMUNITY): Payer: Self-pay | Admitting: Pharmacist

## 2015-10-25 DIAGNOSIS — D509 Iron deficiency anemia, unspecified: Secondary | ICD-10-CM | POA: Diagnosis not present

## 2015-10-25 DIAGNOSIS — N186 End stage renal disease: Secondary | ICD-10-CM | POA: Diagnosis not present

## 2015-10-25 DIAGNOSIS — D631 Anemia in chronic kidney disease: Secondary | ICD-10-CM | POA: Diagnosis not present

## 2015-10-25 DIAGNOSIS — E119 Type 2 diabetes mellitus without complications: Secondary | ICD-10-CM | POA: Diagnosis not present

## 2015-10-25 NOTE — Addendum Note (Signed)
Encounter addended by: Heywood Footman, RN on: 10/25/2015 10:50 AM<BR>     Documentation filed: Charges VN

## 2015-10-25 NOTE — Telephone Encounter (Signed)
Zetia tier exception request approved from 07/21/2015 through 10/18/16. Last filled on 10/14/15 for $45.  Nadia Torr K. Velva Harman, PharmD, BCPS, CPP Clinical Pharmacist Pager: [phone] Phone: [phone] 10/25/2015 12:18 PM

## 2015-10-25 NOTE — Telephone Encounter (Signed)
Called patient to inform of gold seed placement and his sim, lvm for a return call

## 2015-10-26 DIAGNOSIS — Z96651 Presence of right artificial knee joint: Secondary | ICD-10-CM | POA: Diagnosis not present

## 2015-10-26 DIAGNOSIS — M6281 Muscle weakness (generalized): Secondary | ICD-10-CM | POA: Diagnosis not present

## 2015-10-26 DIAGNOSIS — M25661 Stiffness of right knee, not elsewhere classified: Secondary | ICD-10-CM | POA: Diagnosis not present

## 2015-10-26 DIAGNOSIS — M25561 Pain in right knee: Secondary | ICD-10-CM | POA: Diagnosis not present

## 2015-10-26 DIAGNOSIS — R262 Difficulty in walking, not elsewhere classified: Secondary | ICD-10-CM | POA: Diagnosis not present

## 2015-10-26 IMAGING — CR DG KNEE COMPLETE 4+V*R*
4 series · 4 of 4 positions shown · non-contrast
Comparison: None.

CLINICAL DATA: Right knee pain after trip and fall injury today.
Unable to bear weight.

EXAM:
RIGHT KNEE - COMPLETE 4+ VIEW

[t knee ap right]
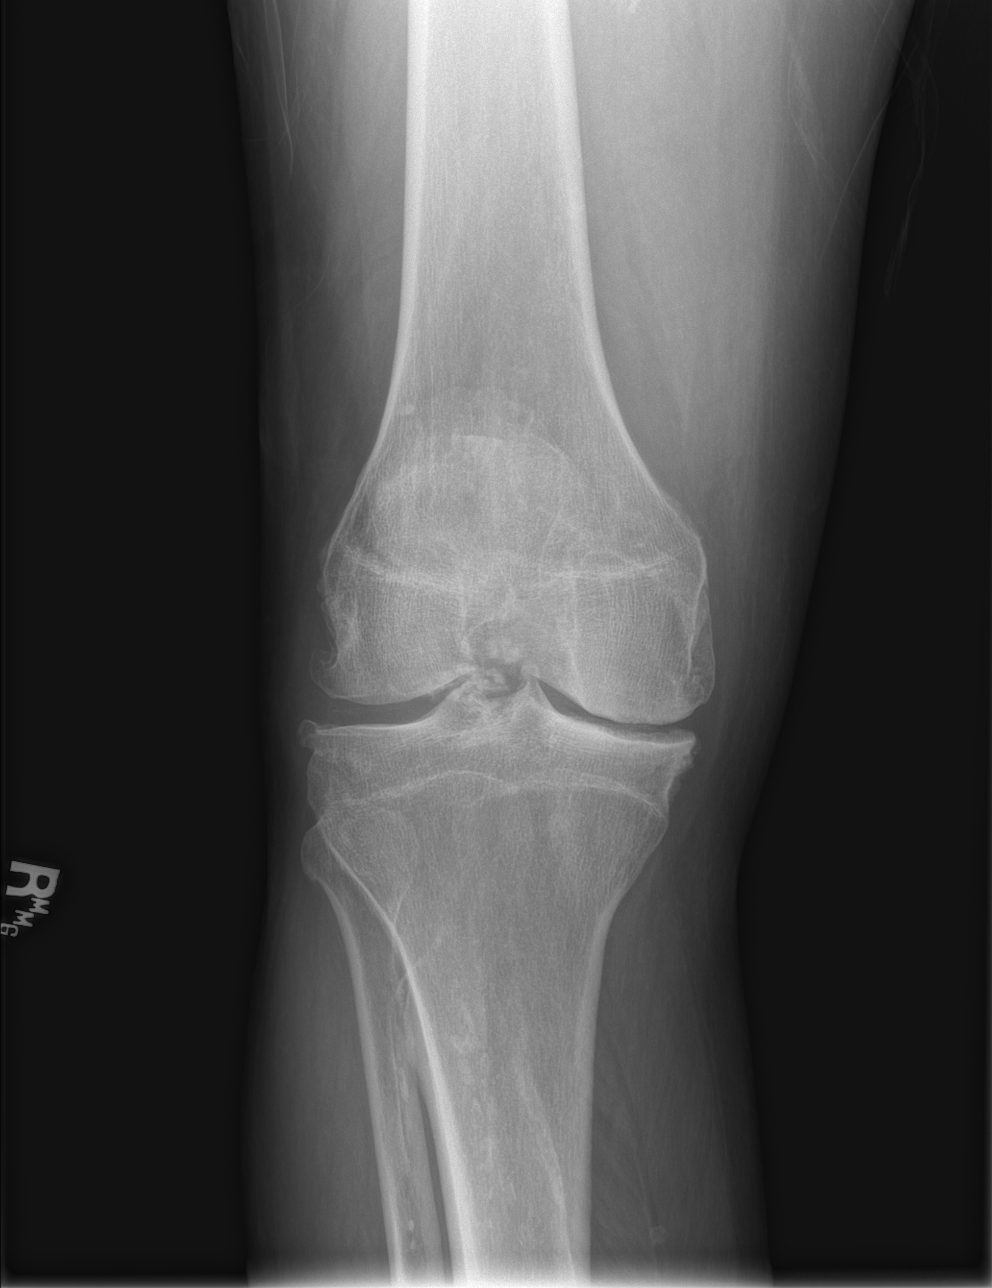

[t knee obl right (1 of 2)]
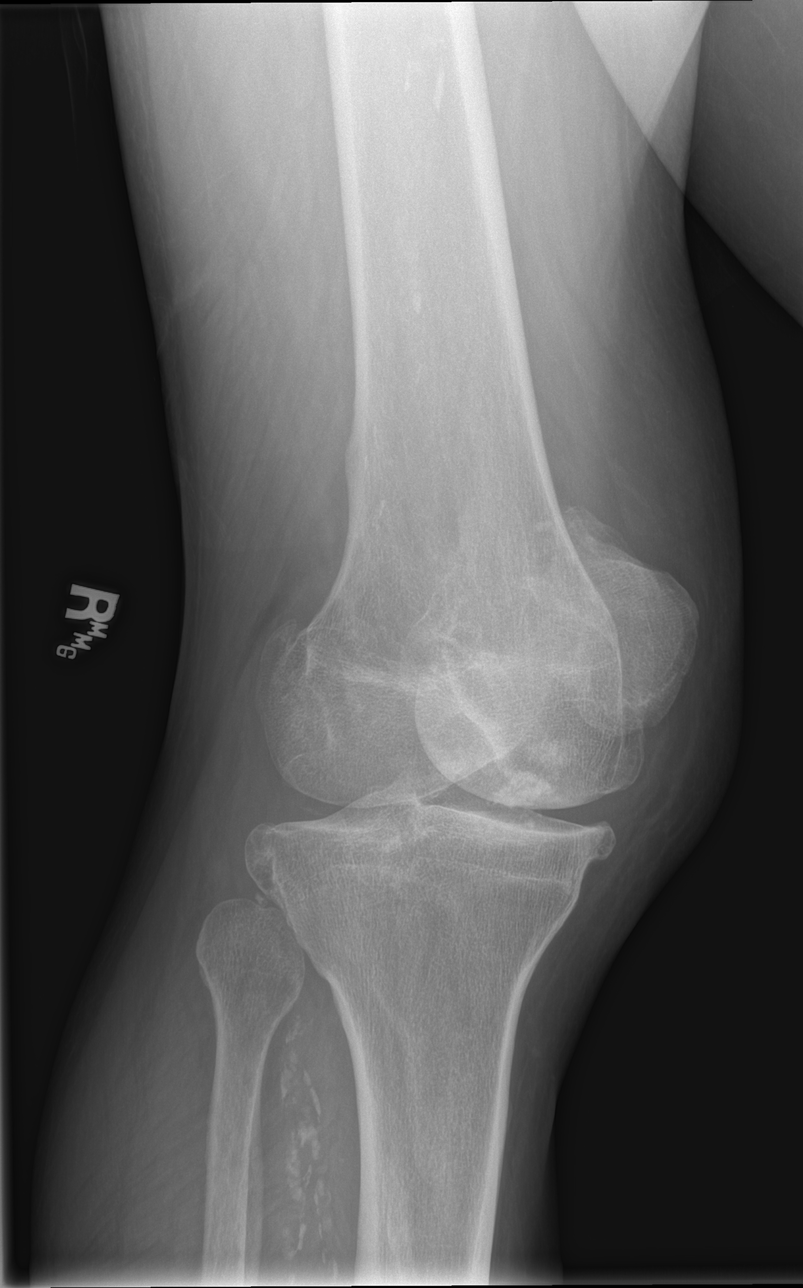

[t knee obl right (2 of 2)]
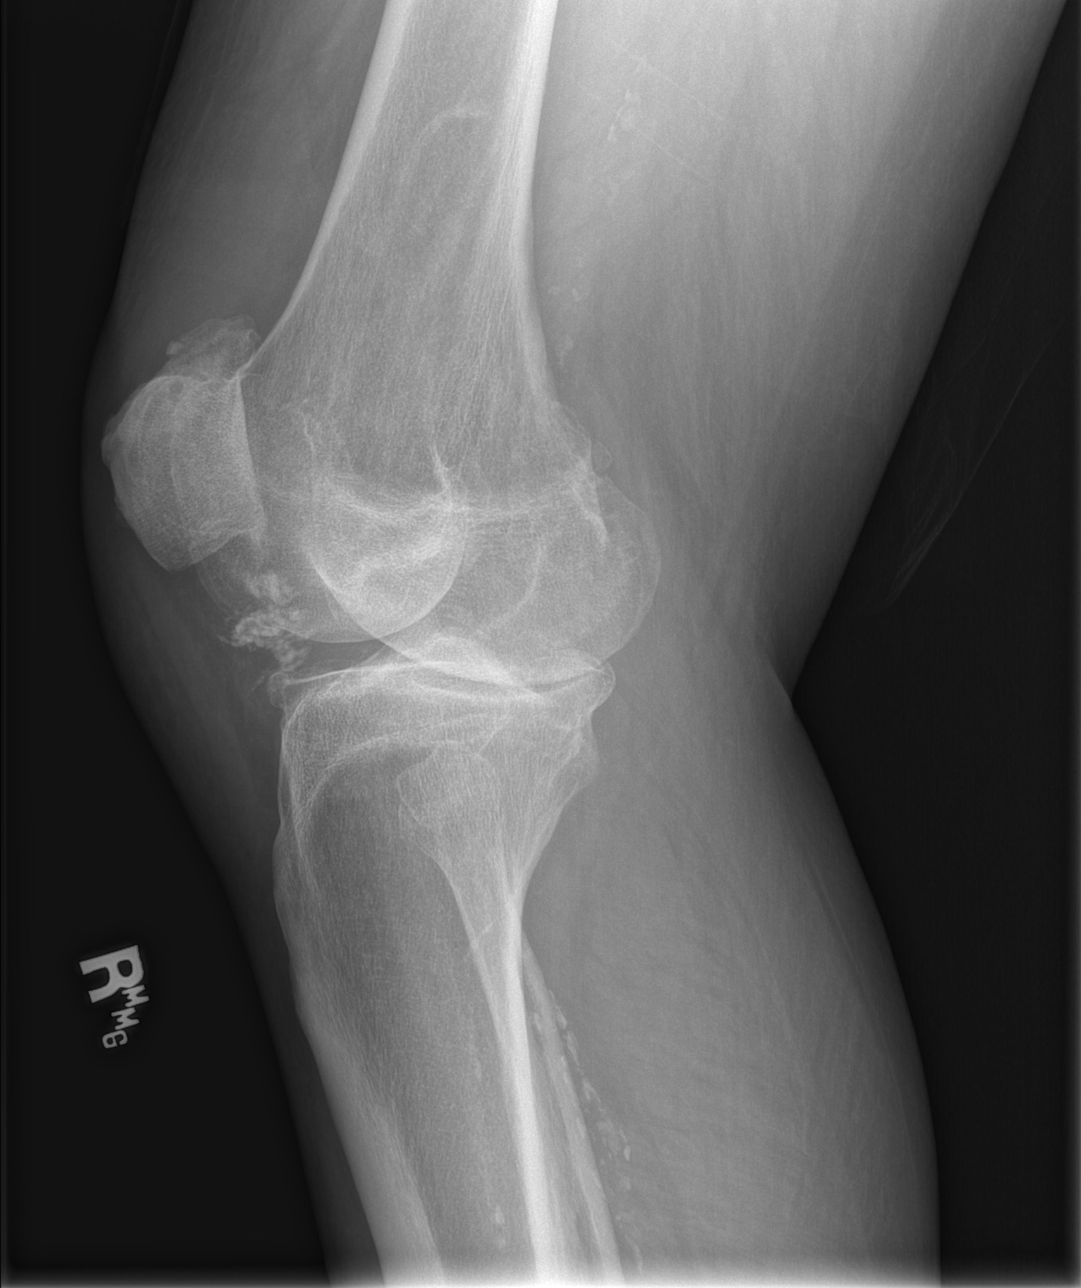

[t knee lat right]
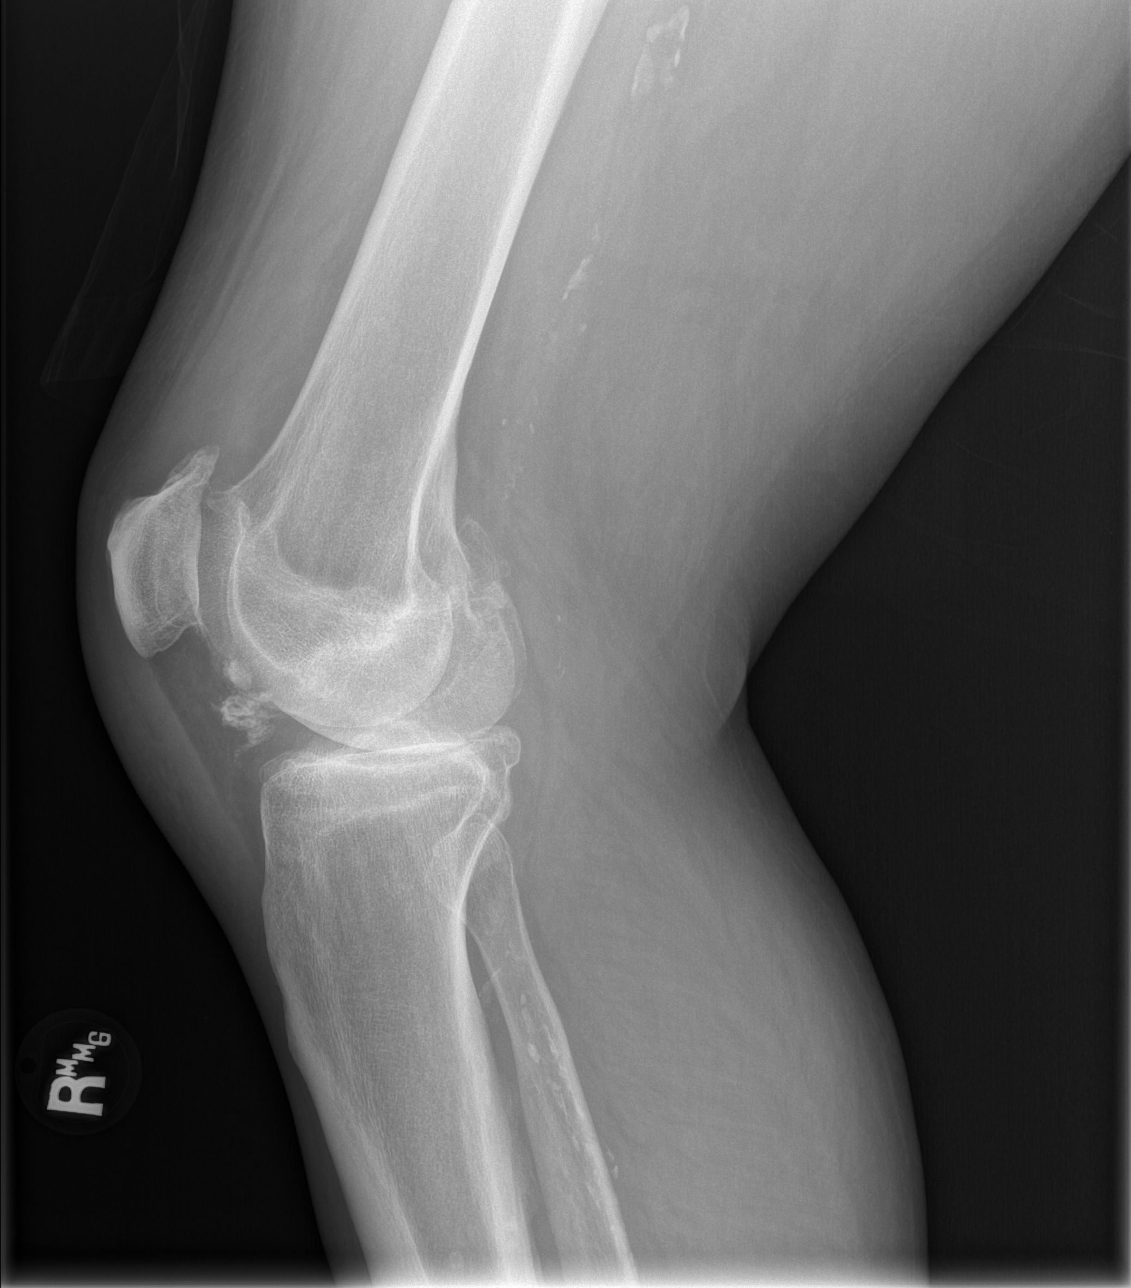

[4 of 4 positions shown; findings below may reference images not displayed]

FINDINGS: Tricompartment degenerative changes in the right knee with medial
greater than lateral compartment narrowing and diffuse
osteophytosis. Cartilaginous calcification consistent with
chondrocalcinosis. Calcification in the anterior knee joint may
represent loose bodies. No evidence of acute fracture or
dislocation. No significant effusion. Vascular calcifications.
IMPRESSION: Prominent degenerative changes in the right knee with possible
anterior loose bodies. No acute bony abnormalities.

## 2015-10-27 DIAGNOSIS — D631 Anemia in chronic kidney disease: Secondary | ICD-10-CM | POA: Diagnosis not present

## 2015-10-27 DIAGNOSIS — N186 End stage renal disease: Secondary | ICD-10-CM | POA: Diagnosis not present

## 2015-10-27 DIAGNOSIS — D509 Iron deficiency anemia, unspecified: Secondary | ICD-10-CM | POA: Diagnosis not present

## 2015-10-27 DIAGNOSIS — E119 Type 2 diabetes mellitus without complications: Secondary | ICD-10-CM | POA: Diagnosis not present

## 2015-10-28 ENCOUNTER — Encounter: Payer: Self-pay | Admitting: *Deleted

## 2015-10-28 NOTE — Progress Notes (Signed)
[facility] Psychosocial Distress Screening Clinical Social Work  Clinical Social Work was referred by distress screening protocol.  The patient scored a 5 on the Psychosocial Distress Thermometer which indicates moderate distress. Clinical Social Worker phoned pt to assess for distress and other psychosocial needs. CSW spoke with pt and wife via phone and introduced self and explained role of CSW, Pt and Family Support team. Pt and wife report pt has many needs and are trying to get additional help through his dialysis center. CSW reviewed options here for support once pt starts treatment. CSW adding pt to mailing list as they are eager to attend [facility]state [facility].   ONCBCN DISTRESS SCREENING 10/24/2015  Screening Type Initial Screening  Distress experienced in past week (1-10) 5  Emotional problem type Depression;Adjusting to illness  Information Concerns Type Lack of info about diagnosis;Lack of info about treatment  Physical Problem type Sleep/insomnia  Physician notified of physical symptoms Yes  Referral to clinical social work No    Clinical Social Worker follow up needed: Yes.    If yes, follow up plan: See above Loren Racer, [place] Worker [place]  [facility] Phone: [phone] Fax: [phone]

## 2015-10-29 DIAGNOSIS — D509 Iron deficiency anemia, unspecified: Secondary | ICD-10-CM | POA: Diagnosis not present

## 2015-10-29 DIAGNOSIS — E119 Type 2 diabetes mellitus without complications: Secondary | ICD-10-CM | POA: Diagnosis not present

## 2015-10-29 DIAGNOSIS — N186 End stage renal disease: Secondary | ICD-10-CM | POA: Diagnosis not present

## 2015-10-29 DIAGNOSIS — D631 Anemia in chronic kidney disease: Secondary | ICD-10-CM | POA: Diagnosis not present

## 2015-10-31 ENCOUNTER — Ambulatory Visit (INDEPENDENT_AMBULATORY_CARE_PROVIDER_SITE_OTHER): Payer: Medicare Other | Admitting: Endocrinology

## 2015-10-31 ENCOUNTER — Encounter: Payer: Self-pay | Admitting: Endocrinology

## 2015-10-31 VITALS — BP 158/90 | HR 88 | Temp 97.6°F | Ht 73.0 in | Wt 225.0 lb

## 2015-10-31 DIAGNOSIS — M25561 Pain in right knee: Secondary | ICD-10-CM | POA: Diagnosis not present

## 2015-10-31 DIAGNOSIS — M25661 Stiffness of right knee, not elsewhere classified: Secondary | ICD-10-CM | POA: Diagnosis not present

## 2015-10-31 DIAGNOSIS — E042 Nontoxic multinodular goiter: Secondary | ICD-10-CM

## 2015-10-31 DIAGNOSIS — E1129 Type 2 diabetes mellitus with other diabetic kidney complication: Secondary | ICD-10-CM

## 2015-10-31 DIAGNOSIS — E1122 Type 2 diabetes mellitus with diabetic chronic kidney disease: Secondary | ICD-10-CM

## 2015-10-31 DIAGNOSIS — M6281 Muscle weakness (generalized): Secondary | ICD-10-CM | POA: Diagnosis not present

## 2015-10-31 DIAGNOSIS — R110 Nausea: Secondary | ICD-10-CM | POA: Diagnosis not present

## 2015-10-31 DIAGNOSIS — N184 Chronic kidney disease, stage 4 (severe): Secondary | ICD-10-CM

## 2015-10-31 DIAGNOSIS — R112 Nausea with vomiting, unspecified: Secondary | ICD-10-CM | POA: Insufficient documentation

## 2015-10-31 DIAGNOSIS — R262 Difficulty in walking, not elsewhere classified: Secondary | ICD-10-CM | POA: Diagnosis not present

## 2015-10-31 LAB — HEPATIC FUNCTION PANEL
ALBUMIN: 4.2 g/dL (ref 3.5–5.2)
ALK PHOS: 73 U/L (ref 39–117)
ALT: 14 U/L (ref 0–53)
AST: 16 U/L (ref 0–37)
Bilirubin, Direct: 0.2 mg/dL (ref 0.0–0.3)
TOTAL PROTEIN: 8 g/dL (ref 6.0–8.3)
Total Bilirubin: 0.8 mg/dL (ref 0.2–1.2)

## 2015-10-31 LAB — CBC WITH DIFFERENTIAL/PLATELET
BASOS ABS: 0 10*3/uL (ref 0.0–0.1)
BASOS PCT: 0.3 % (ref 0.0–3.0)
Eosinophils Absolute: 0.2 10*3/uL (ref 0.0–0.7)
Eosinophils Relative: 3.4 % (ref 0.0–5.0)
HEMATOCRIT: 37.8 % — AB (ref 39.0–52.0)
Hemoglobin: 11.6 g/dL — ABNORMAL LOW (ref 13.0–17.0)
LYMPHS PCT: 26.4 % (ref 12.0–46.0)
Lymphs Abs: 1.6 10*3/uL (ref 0.7–4.0)
MCHC: 30.8 g/dL (ref 30.0–36.0)
MCV: 87.5 fl (ref 78.0–100.0)
MONOS PCT: 10.9 % (ref 3.0–12.0)
Monocytes Absolute: 0.7 10*3/uL (ref 0.1–1.0)
NEUTROS ABS: 3.6 10*3/uL (ref 1.4–7.7)
Neutrophils Relative %: 59 % (ref 43.0–77.0)
PLATELETS: 325 10*3/uL (ref 150.0–400.0)
RBC: 4.32 Mil/uL (ref 4.22–5.81)
RDW: 22.1 % — ABNORMAL HIGH (ref 11.5–15.5)
WBC: 6.1 10*3/uL (ref 4.0–10.5)

## 2015-10-31 LAB — POCT GLYCOSYLATED HEMOGLOBIN (HGB A1C): HEMOGLOBIN A1C: 4.7

## 2015-10-31 LAB — AMYLASE: Amylase: 60 U/L (ref 27–131)

## 2015-10-31 LAB — TSH: TSH: 0.38 u[IU]/mL (ref 0.35–4.50)

## 2015-10-31 MED ORDER — ONDANSETRON HCL 4 MG PO TABS: 4.0000 mg | ORAL_TABLET | Freq: Three times a day (TID) | ORAL | Status: DC | PRN

## 2015-10-31 NOTE — Progress Notes (Signed)
Subjective:    Patient ID: [name], male    DOB: 1951/02/21, 65 y.o.   MRN: [id number]  HPI The state of at least three ongoing medical problems is addressed today: Pt returns for f/u of diabetes mellitus: DM type: 2 Dx'ed: 123456 Complications: polyneuropathy, renal failure, and CAD. Therapy: 2 oral meds DKA: never.  Severe hypoglycemia: never.  Pancreatitis: never Other: med needs has decreased with worsening of renal function.  Interval history:  no cbg record, but states cbg's vary from 90-120.  There is no trend throughout the day.  He denies hypoglycemia.   Hyperthyroidism (due to a multinodular goiter; dx'ed 2009; bx then was benign; nuc med scan showed very low uptake, so tapazole was chosen as rx).  He has not recently taken tapazole.  He has lost weight Past Medical History  Diagnosis Date  . Hyperthyroidism   . GASTROENTERITIS, VIRAL 10/14/2009  . ONYCHOMYCOSIS, TOENAILS 12/26/2007  . GOITER, MULTINODULAR 12/26/2007  . HYPERTHYROIDISM 02/02/2010  . DIABETES MELLITUS, TYPE II 02/01/2010  . DYSLIPIDEMIA 06/18/2007  . GOUT 06/18/2007  . Hypocalcemia 06/07/2010  . DEPRESSION 10/14/2009  . PERIPHERAL NEUROPATHY 06/18/2007  . HYPERTENSION 06/18/2007  . CONGESTIVE HEART FAILURE 06/18/2007  . DIASTOLIC HEART FAILURE, CHRONIC 02/06/2009  . Unspecified hypotension 01/30/2010  . PULMONARY NODULE, RIGHT LOWER LOBE 06/08/2009  . GERD 06/18/2007  . BENIGN PROSTATIC HYPERTROPHY 10/14/2009  . GYNECOMASTIA 07/17/2010  . NECK PAIN 07/31/2010  . FOOT PAIN 08/12/2008  . DIZZINESS 07/17/2010  . Other malaise and fatigue 11/24/2009  . GAIT DISTURBANCE 03/03/2010  . DYSPNEA 10/29/2008  . CHEST PAIN 03/29/2010  . TRANSAMINASES, SERUM, ELEVATED 02/01/2010  . COLONIC POLYPS, HX OF 10/14/2009  . Anemia 06/16/2011  . Ischemic cardiomyopathy 06/16/2011  . Hyperparathyroidism, secondary ([name]) 06/16/2011  . OSA on CPAP 10/16/2011  . Hyperlipidemia 10/16/2011  . CAD, NATIVE VESSEL 02/06/2009    saw Dr. Missy Sabins  last jan 2013  . Sleep apnea     cpap machine and o2  . Allergic rhinitis, cause unspecified 02/24/2014  . Transfusion history     none recent  . Hemodialysis access, fistula mature [facility])     Dialysis T-Th-Sa ([place]) Right upper arm fistula  . ESRD (end stage renal disease) on dialysis ([place]) 08/04/2010    "TTS;  " (04/18/2015)  . Dementia [id number]  . Renal insufficiency   . Depression 09/24/2015  . Prostate cancer [facility])     Past Surgical History  Procedure Laterality Date  . Coronary angioplasty with stent placement  06/11/2008  . Cervical spine surgery  2/09    "to repair nerve problems in my left arm"  . Back surgery  1998  . Esophagogastroduodenoscopy  09/28/2011    Procedure: ESOPHAGOGASTRODUODENOSCOPY (EGD);  Surgeon: Missy Sabins, MD;  Location: [facility] ENDOSCOPY;  Service: Endoscopy;  Laterality: N/A;  . Givens capsule study  09/30/2011    Procedure: GIVENS CAPSULE STUDY;  Surgeon: Jeryl Columbia, MD;  Location: [facility] ENDOSCOPY;  Service: Endoscopy;  Laterality: N/A;  . Cholecystectomy    . Coronary angioplasty with stent placement  06/2007    TAXUS stent to RCA/notes 01/31/2011  . Tonsillectomy    . Av fistula placement  11/07/2011    Procedure: INSERTION OF ARTERIOVENOUS (AV) GORE-TEX GRAFT ARM;  Surgeon: Tinnie Gens, MD;  Location: [place];  Service: Vascular;  Laterality: Left;  . Arteriovenous graft placement Right 2009    forearm/notes 02/01/2011  . Insertion of dialysis catheter Right 2014  .  Insertion of dialysis catheter Left 02/11/2013    Procedure: INSERTION OF DIALYSIS CATHETER;  Surgeon: Conrad Brevig Mission, MD;  Location: [place];  Service: Vascular;  Laterality: Left;  Ultrasound guided  . Removal of a dialysis catheter Right 02/11/2013    Procedure: REMOVAL OF A DIALYSIS CATHETER;  Surgeon: Conrad Edroy, MD;  Location: [place];  Service: Vascular;  Laterality: Right;  . Bascilic vein transposition Right 02/27/2013    Procedure: BASCILIC VEIN TRANSPOSITION;  Surgeon:  Mal Misty, MD;  Location: [place];  Service: Vascular;  Laterality: Right;  Right Basilic Vein Transposition   . Shuntogram N/A 09/20/2011    Procedure: Earney Mallet;  Surgeon: Conrad [place], MD;  Location: [facility] CATH LAB;  Service: Cardiovascular;  Laterality: N/A;  . Venogram N/A 01/26/2013    Procedure: VENOGRAM;  Surgeon: Angelia Mould, MD;  Location: [facility] CATH LAB;  Service: Cardiovascular;  Laterality: N/A;  . Esophagogastroduodenoscopy N/A 04/07/2015    Procedure: ESOPHAGOGASTRODUODENOSCOPY (EGD);  Surgeon: Teena Irani, MD;  Location: [facility] ENDOSCOPY;  Service: Endoscopy;  Laterality: N/A;  . Savory dilation N/A 04/07/2015    Procedure: SAVORY DILATION;  Surgeon: Teena Irani, MD;  Location: [facility] ENDOSCOPY;  Service: Endoscopy;  Laterality: N/A;  . Foreign body removal  09/2003    via upper endoscopy/notes 02/12/2011  . Esophagogastroduodenoscopy N/A 04/19/2015    Procedure: ESOPHAGOGASTRODUODENOSCOPY (EGD);  Surgeon: Arta Silence, MD;  Location: [facility] ENDOSCOPY;  Service: Endoscopy;  Laterality: N/A;  . Total knee arthroplasty Right 08/02/2015    Procedure: TOTAL KNEE ARTHROPLASTY;  Surgeon: Renette Butters, MD;  Location: [place];  Service: Orthopedics;  Laterality: Right;    Social History   Social History  . Marital Status: Married    Spouse Name: N/A  . Number of Children: N/A  . Years of Education: N/A   Occupational History  . disabled   . formerly Lawyer for [employer].    Social History Main Topics  . Smoking status: Former Smoker -- 1.00 packs/day for 25 years    Types: Cigarettes    Quit date: 10/01/2005  . Smokeless tobacco: Former Systems developer    Quit date: 10/01/2005     Comment: Quit smoking 2007 Smoked x 25 years 1/2 ppd.  . Alcohol Use: 0.0 oz/week    0 Standard drinks or equivalent per week     Comment: occassional   . Drug Use: No  . Sexual Activity: No   Other Topics Concern  . Not on file   Social History Narrative    Current Outpatient Prescriptions on  File Prior to Visit  Medication Sig Dispense Refill  . acetaminophen (TYLENOL) 325 MG tablet Take 1 tablet (325 mg total) by mouth every 4 (four) hours as needed for headache or mild pain.    [name] allopurinol (ZYLOPRIM) 100 MG tablet Take 1 tablet (100 mg total) by mouth daily. (Patient taking differently: Take 100 mg by mouth daily as needed (gout). ) 30 tablet 11  . amLODipine (NORVASC) 10 MG tablet     . calcium-vitamin D (OSCAL WITH D) 500-200 MG-UNIT per tablet Take 3 tablets by mouth 3 (three) times daily with meals.     . carvedilol (COREG) 12.5 MG tablet Take 12.5 mg by mouth See admin instructions. Take 1 tablet (12.5 mg) by mouth on Sunday, Monday, Wednesday and Friday morning, take 1 tablet (12.5 mg) daily at 9pm    . cinacalcet (SENSIPAR) 60 MG tablet Take 60 mg by mouth 2 (two) times daily.     [name]  citalopram (CELEXA) 10 MG tablet Take 1 tablet (10 mg total) by mouth daily. 90 tablet 3  . colesevelam (WELCHOL) 625 MG tablet Take 3 tablets (1,875 mg total) by mouth 2 (two) times daily with a meal. 180 tablet 11  . dexlansoprazole (DEXILANT) 60 MG capsule Take 60 mg by mouth daily as needed (acid reflux).    [name] docusate sodium (COLACE) 100 MG capsule Take 1 capsule (100 mg total) by mouth 2 (two) times daily. (Patient taking differently: Take 100 mg by mouth 2 (two) times daily as needed (pain). ) 10 capsule 0  . ezetimibe (ZETIA) 10 MG tablet Take 1 tablet (10 mg total) by mouth daily. 30 tablet 6  . gabapentin (NEURONTIN) 300 MG capsule 1-2 tabs by mouth at bedtime for sleep and pain 180 capsule 3  . HYDROcodone-acetaminophen (NORCO/VICODIN) 5-325 MG tablet Take 1 tablet by mouth every 6 (six) hours as needed for severe pain. 30 tablet 0  . lanthanum (FOSRENOL) 1000 MG chewable tablet Chew 1,000 mg by mouth 2 (two) times daily with a meal.     . Multiple Vitamins-Minerals (MULTIVITAMIN WITH MINERALS) tablet Take by mouth. Reported on 10/24/2015    . multivitamin (RENA-VIT) TABS tablet Take 1  tablet by mouth daily. Reported on 10/24/2015     No current facility-administered medications on file prior to visit.    Allergies  Allergen Reactions  . Cephalexin Swelling and Other (See Comments)    Tongue swelling  . Statins Other (See Comments)    Weak muscles  . Ciprofloxacin Rash    Family History  Problem Relation Age of Onset  . Heart disease Father   . Diabetes Father   . Kidney failure Father   . Hypertension Father   . Heart disease Sister   . Cancer Neg Hx     BP 158/90 mmHg  Pulse 88  Temp(Src) 97.6 F (36.4 C) (Oral)  Ht 6\' 1"  (1.854 m)  Wt 225 lb (102.059 kg)  BMI 29.69 kg/m2  SpO2 94%  Review of Systems He c/o dry mouth.  He has 1 day of slight nausea and diarrhea.  He has chronic right flank pain, but no abd pain.     Objective:   Physical Exam VITAL SIGNS:  See vs page GENERAL: no distress ABDOMEN: abdomen is soft, nontender.  no hepatosplenomegaly.  not distended.  no hernia Pulses: dorsalis pedis intact bilat.   MSK: no deformity of the feet CV: no leg edema Skin:  no ulcer on the feet.  normal color and temp on the feet. Neuro: sensation is intact to touch on the feet, but decreased from normal.  A1c=4.7% Lab Results  Component Value Date   TSH 0.38 10/31/2015   T4TOTAL 5.6 07/19/2008      Assessment & Plan:  DM: overcontrolled. Nausea, new, uncertain etiology. Hyperthyroidism: well-controlled off rx, at least for now.     Patient is advised the following: Patient Instructions  blood tests are requested for you today.  We'll let you know about the results. i have sent a prescription to your pharmacy, for the nausea.  Please call Dr [name] if the nausea persists.  Please stop taking the nateglinide.   check your blood sugar once a day.  vary the time of day when you check, between before the 3 meals, and at bedtime.  also check if you have symptoms of your blood sugar being too high or too low.  please keep a record of the readings  and bring  it to your next appointment here (or you can bring the meter itself).  You can write it on any piece of paper.  please call us sooner if your blood sugar goes below 70, or if you have a lot of readings over 200. Please come back for a follow-up appointment in 3 months.    addendum: you can hold off on tapazole for now

## 2015-10-31 NOTE — Patient Instructions (Addendum)
blood tests are requested for you today.  We'll let you know about the results. i have sent a prescription to your pharmacy, for the nausea.  Please call Dr [name] if the nausea persists.  Please stop taking the nateglinide.   check your blood sugar once a day.  vary the time of day when you check, between before the 3 meals, and at bedtime.  also check if you have symptoms of your blood sugar being too high or too low.  please keep a record of the readings and bring it to your next appointment here (or you can bring the meter itself).  You can write it on any piece of paper.  please call us sooner if your blood sugar goes below 70, or if you have a lot of readings over 200. Please come back for a follow-up appointment in 3 months.

## 2015-11-01 DIAGNOSIS — Z992 Dependence on renal dialysis: Secondary | ICD-10-CM | POA: Diagnosis not present

## 2015-11-01 DIAGNOSIS — D509 Iron deficiency anemia, unspecified: Secondary | ICD-10-CM | POA: Diagnosis not present

## 2015-11-01 DIAGNOSIS — E1129 Type 2 diabetes mellitus with other diabetic kidney complication: Secondary | ICD-10-CM | POA: Diagnosis not present

## 2015-11-01 DIAGNOSIS — D631 Anemia in chronic kidney disease: Secondary | ICD-10-CM | POA: Diagnosis not present

## 2015-11-01 DIAGNOSIS — N186 End stage renal disease: Secondary | ICD-10-CM | POA: Diagnosis not present

## 2015-11-01 DIAGNOSIS — E119 Type 2 diabetes mellitus without complications: Secondary | ICD-10-CM | POA: Diagnosis not present

## 2015-11-03 DIAGNOSIS — D631 Anemia in chronic kidney disease: Secondary | ICD-10-CM | POA: Diagnosis not present

## 2015-11-03 DIAGNOSIS — N186 End stage renal disease: Secondary | ICD-10-CM | POA: Diagnosis not present

## 2015-11-03 DIAGNOSIS — N2581 Secondary hyperparathyroidism of renal origin: Secondary | ICD-10-CM | POA: Diagnosis not present

## 2015-11-03 DIAGNOSIS — E119 Type 2 diabetes mellitus without complications: Secondary | ICD-10-CM | POA: Diagnosis not present

## 2015-11-03 DIAGNOSIS — D509 Iron deficiency anemia, unspecified: Secondary | ICD-10-CM | POA: Diagnosis not present

## 2015-11-04 DIAGNOSIS — M6281 Muscle weakness (generalized): Secondary | ICD-10-CM | POA: Diagnosis not present

## 2015-11-04 DIAGNOSIS — I70293 Other atherosclerosis of native arteries of extremities, bilateral legs: Secondary | ICD-10-CM | POA: Diagnosis not present

## 2015-11-04 DIAGNOSIS — R262 Difficulty in walking, not elsewhere classified: Secondary | ICD-10-CM | POA: Diagnosis not present

## 2015-11-04 DIAGNOSIS — L602 Onychogryphosis: Secondary | ICD-10-CM | POA: Diagnosis not present

## 2015-11-04 DIAGNOSIS — E1351 Other specified diabetes mellitus with diabetic peripheral angiopathy without gangrene: Secondary | ICD-10-CM | POA: Diagnosis not present

## 2015-11-04 DIAGNOSIS — M25561 Pain in right knee: Secondary | ICD-10-CM | POA: Diagnosis not present

## 2015-11-04 DIAGNOSIS — M25661 Stiffness of right knee, not elsewhere classified: Secondary | ICD-10-CM | POA: Diagnosis not present

## 2015-11-05 DIAGNOSIS — E119 Type 2 diabetes mellitus without complications: Secondary | ICD-10-CM | POA: Diagnosis not present

## 2015-11-05 DIAGNOSIS — N186 End stage renal disease: Secondary | ICD-10-CM | POA: Diagnosis not present

## 2015-11-05 DIAGNOSIS — D509 Iron deficiency anemia, unspecified: Secondary | ICD-10-CM | POA: Diagnosis not present

## 2015-11-05 DIAGNOSIS — D631 Anemia in chronic kidney disease: Secondary | ICD-10-CM | POA: Diagnosis not present

## 2015-11-05 DIAGNOSIS — N2581 Secondary hyperparathyroidism of renal origin: Secondary | ICD-10-CM | POA: Diagnosis not present

## 2015-11-07 DIAGNOSIS — M25661 Stiffness of right knee, not elsewhere classified: Secondary | ICD-10-CM | POA: Diagnosis not present

## 2015-11-07 DIAGNOSIS — M6281 Muscle weakness (generalized): Secondary | ICD-10-CM | POA: Diagnosis not present

## 2015-11-07 DIAGNOSIS — R262 Difficulty in walking, not elsewhere classified: Secondary | ICD-10-CM | POA: Diagnosis not present

## 2015-11-07 DIAGNOSIS — M25561 Pain in right knee: Secondary | ICD-10-CM | POA: Diagnosis not present

## 2015-11-08 DIAGNOSIS — D631 Anemia in chronic kidney disease: Secondary | ICD-10-CM | POA: Diagnosis not present

## 2015-11-08 DIAGNOSIS — N186 End stage renal disease: Secondary | ICD-10-CM | POA: Diagnosis not present

## 2015-11-08 DIAGNOSIS — D509 Iron deficiency anemia, unspecified: Secondary | ICD-10-CM | POA: Diagnosis not present

## 2015-11-08 DIAGNOSIS — E119 Type 2 diabetes mellitus without complications: Secondary | ICD-10-CM | POA: Diagnosis not present

## 2015-11-08 DIAGNOSIS — N2581 Secondary hyperparathyroidism of renal origin: Secondary | ICD-10-CM | POA: Diagnosis not present

## 2015-11-09 ENCOUNTER — Encounter: Payer: Self-pay | Admitting: Internal Medicine

## 2015-11-09 DIAGNOSIS — M25561 Pain in right knee: Secondary | ICD-10-CM | POA: Diagnosis not present

## 2015-11-09 DIAGNOSIS — M25661 Stiffness of right knee, not elsewhere classified: Secondary | ICD-10-CM | POA: Diagnosis not present

## 2015-11-09 DIAGNOSIS — R262 Difficulty in walking, not elsewhere classified: Secondary | ICD-10-CM | POA: Diagnosis not present

## 2015-11-09 DIAGNOSIS — E119 Type 2 diabetes mellitus without complications: Secondary | ICD-10-CM | POA: Diagnosis not present

## 2015-11-09 DIAGNOSIS — M6281 Muscle weakness (generalized): Secondary | ICD-10-CM | POA: Diagnosis not present

## 2015-11-09 DIAGNOSIS — H2513 Age-related nuclear cataract, bilateral: Secondary | ICD-10-CM | POA: Diagnosis not present

## 2015-11-09 DIAGNOSIS — H04123 Dry eye syndrome of bilateral lacrimal glands: Secondary | ICD-10-CM | POA: Diagnosis not present

## 2015-11-09 LAB — HM DIABETES EYE EXAM

## 2015-11-10 DIAGNOSIS — E119 Type 2 diabetes mellitus without complications: Secondary | ICD-10-CM | POA: Diagnosis not present

## 2015-11-10 DIAGNOSIS — D631 Anemia in chronic kidney disease: Secondary | ICD-10-CM | POA: Diagnosis not present

## 2015-11-10 DIAGNOSIS — N2581 Secondary hyperparathyroidism of renal origin: Secondary | ICD-10-CM | POA: Diagnosis not present

## 2015-11-10 DIAGNOSIS — N186 End stage renal disease: Secondary | ICD-10-CM | POA: Diagnosis not present

## 2015-11-10 DIAGNOSIS — D509 Iron deficiency anemia, unspecified: Secondary | ICD-10-CM | POA: Diagnosis not present

## 2015-11-12 DIAGNOSIS — D509 Iron deficiency anemia, unspecified: Secondary | ICD-10-CM | POA: Diagnosis not present

## 2015-11-12 DIAGNOSIS — D631 Anemia in chronic kidney disease: Secondary | ICD-10-CM | POA: Diagnosis not present

## 2015-11-12 DIAGNOSIS — N186 End stage renal disease: Secondary | ICD-10-CM | POA: Diagnosis not present

## 2015-11-12 DIAGNOSIS — N2581 Secondary hyperparathyroidism of renal origin: Secondary | ICD-10-CM | POA: Diagnosis not present

## 2015-11-12 DIAGNOSIS — E119 Type 2 diabetes mellitus without complications: Secondary | ICD-10-CM | POA: Diagnosis not present

## 2015-11-14 DIAGNOSIS — M25561 Pain in right knee: Secondary | ICD-10-CM | POA: Diagnosis not present

## 2015-11-14 DIAGNOSIS — R262 Difficulty in walking, not elsewhere classified: Secondary | ICD-10-CM | POA: Diagnosis not present

## 2015-11-14 DIAGNOSIS — M6281 Muscle weakness (generalized): Secondary | ICD-10-CM | POA: Diagnosis not present

## 2015-11-14 DIAGNOSIS — M25661 Stiffness of right knee, not elsewhere classified: Secondary | ICD-10-CM | POA: Diagnosis not present

## 2015-11-15 DIAGNOSIS — N2581 Secondary hyperparathyroidism of renal origin: Secondary | ICD-10-CM | POA: Diagnosis not present

## 2015-11-15 DIAGNOSIS — N186 End stage renal disease: Secondary | ICD-10-CM | POA: Diagnosis not present

## 2015-11-15 DIAGNOSIS — E119 Type 2 diabetes mellitus without complications: Secondary | ICD-10-CM | POA: Diagnosis not present

## 2015-11-15 DIAGNOSIS — D509 Iron deficiency anemia, unspecified: Secondary | ICD-10-CM | POA: Diagnosis not present

## 2015-11-15 DIAGNOSIS — D631 Anemia in chronic kidney disease: Secondary | ICD-10-CM | POA: Diagnosis not present

## 2015-11-16 DIAGNOSIS — M25561 Pain in right knee: Secondary | ICD-10-CM | POA: Diagnosis not present

## 2015-11-16 DIAGNOSIS — R262 Difficulty in walking, not elsewhere classified: Secondary | ICD-10-CM | POA: Diagnosis not present

## 2015-11-16 DIAGNOSIS — M6281 Muscle weakness (generalized): Secondary | ICD-10-CM | POA: Diagnosis not present

## 2015-11-16 DIAGNOSIS — M25661 Stiffness of right knee, not elsewhere classified: Secondary | ICD-10-CM | POA: Diagnosis not present

## 2015-11-17 DIAGNOSIS — N2581 Secondary hyperparathyroidism of renal origin: Secondary | ICD-10-CM | POA: Diagnosis not present

## 2015-11-17 DIAGNOSIS — D509 Iron deficiency anemia, unspecified: Secondary | ICD-10-CM | POA: Diagnosis not present

## 2015-11-17 DIAGNOSIS — N186 End stage renal disease: Secondary | ICD-10-CM | POA: Diagnosis not present

## 2015-11-17 DIAGNOSIS — D631 Anemia in chronic kidney disease: Secondary | ICD-10-CM | POA: Diagnosis not present

## 2015-11-17 DIAGNOSIS — E119 Type 2 diabetes mellitus without complications: Secondary | ICD-10-CM | POA: Diagnosis not present

## 2015-11-19 DIAGNOSIS — E119 Type 2 diabetes mellitus without complications: Secondary | ICD-10-CM | POA: Diagnosis not present

## 2015-11-19 DIAGNOSIS — N186 End stage renal disease: Secondary | ICD-10-CM | POA: Diagnosis not present

## 2015-11-19 DIAGNOSIS — D509 Iron deficiency anemia, unspecified: Secondary | ICD-10-CM | POA: Diagnosis not present

## 2015-11-19 DIAGNOSIS — N2581 Secondary hyperparathyroidism of renal origin: Secondary | ICD-10-CM | POA: Diagnosis not present

## 2015-11-19 DIAGNOSIS — D631 Anemia in chronic kidney disease: Secondary | ICD-10-CM | POA: Diagnosis not present

## 2015-11-21 ENCOUNTER — Telehealth: Payer: Self-pay

## 2015-11-21 DIAGNOSIS — Z471 Aftercare following joint replacement surgery: Secondary | ICD-10-CM | POA: Diagnosis not present

## 2015-11-21 DIAGNOSIS — M1711 Unilateral primary osteoarthritis, right knee: Secondary | ICD-10-CM | POA: Diagnosis not present

## 2015-11-21 DIAGNOSIS — Z96651 Presence of right artificial knee joint: Secondary | ICD-10-CM | POA: Diagnosis not present

## 2015-11-21 NOTE — Telephone Encounter (Signed)
Home Health Cert/Plan of Care received (09/12/2015 - 11/10/2015) signed. Faxed, copy sent to scan

## 2015-11-22 DIAGNOSIS — N186 End stage renal disease: Secondary | ICD-10-CM | POA: Diagnosis not present

## 2015-11-22 DIAGNOSIS — D631 Anemia in chronic kidney disease: Secondary | ICD-10-CM | POA: Diagnosis not present

## 2015-11-22 DIAGNOSIS — E119 Type 2 diabetes mellitus without complications: Secondary | ICD-10-CM | POA: Diagnosis not present

## 2015-11-22 DIAGNOSIS — D509 Iron deficiency anemia, unspecified: Secondary | ICD-10-CM | POA: Diagnosis not present

## 2015-11-22 DIAGNOSIS — N2581 Secondary hyperparathyroidism of renal origin: Secondary | ICD-10-CM | POA: Diagnosis not present

## 2015-11-22 NOTE — Telephone Encounter (Signed)
Paperwork signed, faxed, copy sent to scan 

## 2015-11-23 ENCOUNTER — Ambulatory Visit: Payer: Medicare Other | Admitting: Neurology

## 2015-11-24 DIAGNOSIS — D631 Anemia in chronic kidney disease: Secondary | ICD-10-CM | POA: Diagnosis not present

## 2015-11-24 DIAGNOSIS — E119 Type 2 diabetes mellitus without complications: Secondary | ICD-10-CM | POA: Diagnosis not present

## 2015-11-24 DIAGNOSIS — N186 End stage renal disease: Secondary | ICD-10-CM | POA: Diagnosis not present

## 2015-11-24 DIAGNOSIS — N2581 Secondary hyperparathyroidism of renal origin: Secondary | ICD-10-CM | POA: Diagnosis not present

## 2015-11-24 DIAGNOSIS — D509 Iron deficiency anemia, unspecified: Secondary | ICD-10-CM | POA: Diagnosis not present

## 2015-11-25 DIAGNOSIS — C61 Malignant neoplasm of prostate: Secondary | ICD-10-CM | POA: Diagnosis not present

## 2015-11-26 DIAGNOSIS — N186 End stage renal disease: Secondary | ICD-10-CM | POA: Diagnosis not present

## 2015-11-26 DIAGNOSIS — D631 Anemia in chronic kidney disease: Secondary | ICD-10-CM | POA: Diagnosis not present

## 2015-11-26 DIAGNOSIS — D509 Iron deficiency anemia, unspecified: Secondary | ICD-10-CM | POA: Diagnosis not present

## 2015-11-26 DIAGNOSIS — E119 Type 2 diabetes mellitus without complications: Secondary | ICD-10-CM | POA: Diagnosis not present

## 2015-11-26 DIAGNOSIS — N2581 Secondary hyperparathyroidism of renal origin: Secondary | ICD-10-CM | POA: Diagnosis not present

## 2015-11-29 DIAGNOSIS — D509 Iron deficiency anemia, unspecified: Secondary | ICD-10-CM | POA: Diagnosis not present

## 2015-11-29 DIAGNOSIS — N186 End stage renal disease: Secondary | ICD-10-CM | POA: Diagnosis not present

## 2015-11-29 DIAGNOSIS — N2581 Secondary hyperparathyroidism of renal origin: Secondary | ICD-10-CM | POA: Diagnosis not present

## 2015-11-29 DIAGNOSIS — Z992 Dependence on renal dialysis: Secondary | ICD-10-CM | POA: Diagnosis not present

## 2015-11-29 DIAGNOSIS — E119 Type 2 diabetes mellitus without complications: Secondary | ICD-10-CM | POA: Diagnosis not present

## 2015-11-29 DIAGNOSIS — D631 Anemia in chronic kidney disease: Secondary | ICD-10-CM | POA: Diagnosis not present

## 2015-11-29 DIAGNOSIS — E1129 Type 2 diabetes mellitus with other diabetic kidney complication: Secondary | ICD-10-CM | POA: Diagnosis not present

## 2015-12-01 DIAGNOSIS — D631 Anemia in chronic kidney disease: Secondary | ICD-10-CM | POA: Diagnosis not present

## 2015-12-01 DIAGNOSIS — N2581 Secondary hyperparathyroidism of renal origin: Secondary | ICD-10-CM | POA: Diagnosis not present

## 2015-12-01 DIAGNOSIS — D509 Iron deficiency anemia, unspecified: Secondary | ICD-10-CM | POA: Diagnosis not present

## 2015-12-01 DIAGNOSIS — E119 Type 2 diabetes mellitus without complications: Secondary | ICD-10-CM | POA: Diagnosis not present

## 2015-12-01 DIAGNOSIS — N186 End stage renal disease: Secondary | ICD-10-CM | POA: Diagnosis not present

## 2015-12-02 ENCOUNTER — Ambulatory Visit
Admission: RE | Admit: 2015-12-02 | Discharge: 2015-12-02 | Disposition: A | Discharge status: Home / Self Care | Payer: Medicare Other | Source: Ambulatory Visit | Attending: Radiation Oncology | Admitting: Radiation Oncology

## 2015-12-02 DIAGNOSIS — C61 Malignant neoplasm of prostate: Secondary | ICD-10-CM

## 2015-12-02 DIAGNOSIS — E119 Type 2 diabetes mellitus without complications: Secondary | ICD-10-CM | POA: Diagnosis not present

## 2015-12-02 DIAGNOSIS — E0590 Thyrotoxicosis, unspecified without thyrotoxic crisis or storm: Secondary | ICD-10-CM | POA: Diagnosis not present

## 2015-12-02 DIAGNOSIS — B351 Tinea unguium: Secondary | ICD-10-CM | POA: Diagnosis not present

## 2015-12-02 DIAGNOSIS — Z510 Encounter for antineoplastic radiation therapy: Secondary | ICD-10-CM | POA: Diagnosis not present

## 2015-12-02 DIAGNOSIS — E785 Hyperlipidemia, unspecified: Secondary | ICD-10-CM | POA: Diagnosis not present

## 2015-12-02 NOTE — Progress Notes (Signed)
  Radiation Oncology         [phone] ________________________________  Name: [name] MRN: [id number]  Date: 12/02/2015  DOB: 1951-08-12  SIMULATION AND TREATMENT PLANNING NOTE    ICD-9-CM ICD-10-CM   1. Malignant neoplasm of prostate ([place]) 185 C61     DIAGNOSIS:  65 y.o. gentleman with stage T1c adenocarcinoma of the prostate with a Gleason's score of 4+3 and a PSA of 5.65   NARRATIVE:  The patient was brought to the [facility].  Identity was confirmed.  All relevant records and images related to the planned course of therapy were reviewed.  The patient freely provided informed written consent to proceed with treatment after reviewing the details related to the planned course of therapy. The consent form was witnessed and verified by the simulation staff.  Then, the patient was set-up in a stable reproducible supine position for radiation therapy.  A vacuum lock pillow device was custom fabricated to position his legs in a reproducible immobilized position.  Then, I performed a urethrogram under sterile conditions to identify the prostatic apex.  CT images were obtained.  Surface markings were placed.  The CT images were loaded into the planning software.  Then the prostate target and avoidance structures including the rectum, bladder, bowel and hips were contoured.  Treatment planning then occurred.  The radiation prescription was entered and confirmed.  A total of one complex treatment device was fabricated. I have requested : Intensity Modulated Radiotherapy (IMRT) is medically necessary for this case for the following reason:  Rectal sparing.[name]  PLAN:  The patient will receive 78 Gy in 40 fractions.  ________________________________  Sheral Apley Tammi Klippel, M.D.    This document serves as a record of services personally performed by Tyler Pita, MD. It was created on his behalf by Lendon Collar, a trained medical scribe. The creation of this record is based  on the scribe's personal observations and the provider's statements to them. This document has been checked and approved by the attending provider.

## 2015-12-03 DIAGNOSIS — N186 End stage renal disease: Secondary | ICD-10-CM | POA: Diagnosis not present

## 2015-12-03 DIAGNOSIS — N2581 Secondary hyperparathyroidism of renal origin: Secondary | ICD-10-CM | POA: Diagnosis not present

## 2015-12-03 DIAGNOSIS — D509 Iron deficiency anemia, unspecified: Secondary | ICD-10-CM | POA: Diagnosis not present

## 2015-12-03 DIAGNOSIS — E119 Type 2 diabetes mellitus without complications: Secondary | ICD-10-CM | POA: Diagnosis not present

## 2015-12-03 DIAGNOSIS — D631 Anemia in chronic kidney disease: Secondary | ICD-10-CM | POA: Diagnosis not present

## 2015-12-05 DIAGNOSIS — M6281 Muscle weakness (generalized): Secondary | ICD-10-CM | POA: Diagnosis not present

## 2015-12-05 DIAGNOSIS — R112 Nausea with vomiting, unspecified: Secondary | ICD-10-CM | POA: Diagnosis not present

## 2015-12-05 DIAGNOSIS — M25561 Pain in right knee: Secondary | ICD-10-CM | POA: Diagnosis not present

## 2015-12-05 DIAGNOSIS — R262 Difficulty in walking, not elsewhere classified: Secondary | ICD-10-CM | POA: Diagnosis not present

## 2015-12-05 DIAGNOSIS — M25661 Stiffness of right knee, not elsewhere classified: Secondary | ICD-10-CM | POA: Diagnosis not present

## 2015-12-06 DIAGNOSIS — N186 End stage renal disease: Secondary | ICD-10-CM | POA: Diagnosis not present

## 2015-12-06 DIAGNOSIS — E119 Type 2 diabetes mellitus without complications: Secondary | ICD-10-CM | POA: Diagnosis not present

## 2015-12-06 DIAGNOSIS — N2581 Secondary hyperparathyroidism of renal origin: Secondary | ICD-10-CM | POA: Diagnosis not present

## 2015-12-06 DIAGNOSIS — D509 Iron deficiency anemia, unspecified: Secondary | ICD-10-CM | POA: Diagnosis not present

## 2015-12-06 DIAGNOSIS — D631 Anemia in chronic kidney disease: Secondary | ICD-10-CM | POA: Diagnosis not present

## 2015-12-07 ENCOUNTER — Ambulatory Visit: Payer: Medicare Other | Admitting: Internal Medicine

## 2015-12-08 DIAGNOSIS — D631 Anemia in chronic kidney disease: Secondary | ICD-10-CM | POA: Diagnosis not present

## 2015-12-08 DIAGNOSIS — N2581 Secondary hyperparathyroidism of renal origin: Secondary | ICD-10-CM | POA: Diagnosis not present

## 2015-12-08 DIAGNOSIS — N186 End stage renal disease: Secondary | ICD-10-CM | POA: Diagnosis not present

## 2015-12-08 DIAGNOSIS — E119 Type 2 diabetes mellitus without complications: Secondary | ICD-10-CM | POA: Diagnosis not present

## 2015-12-08 DIAGNOSIS — D509 Iron deficiency anemia, unspecified: Secondary | ICD-10-CM | POA: Diagnosis not present

## 2015-12-10 DIAGNOSIS — N186 End stage renal disease: Secondary | ICD-10-CM | POA: Diagnosis not present

## 2015-12-10 DIAGNOSIS — D509 Iron deficiency anemia, unspecified: Secondary | ICD-10-CM | POA: Diagnosis not present

## 2015-12-10 DIAGNOSIS — N2581 Secondary hyperparathyroidism of renal origin: Secondary | ICD-10-CM | POA: Diagnosis not present

## 2015-12-10 DIAGNOSIS — E119 Type 2 diabetes mellitus without complications: Secondary | ICD-10-CM | POA: Diagnosis not present

## 2015-12-10 DIAGNOSIS — D631 Anemia in chronic kidney disease: Secondary | ICD-10-CM | POA: Diagnosis not present

## 2015-12-11 DIAGNOSIS — Z510 Encounter for antineoplastic radiation therapy: Secondary | ICD-10-CM | POA: Diagnosis not present

## 2015-12-11 DIAGNOSIS — B351 Tinea unguium: Secondary | ICD-10-CM | POA: Diagnosis not present

## 2015-12-11 DIAGNOSIS — E119 Type 2 diabetes mellitus without complications: Secondary | ICD-10-CM | POA: Diagnosis not present

## 2015-12-11 DIAGNOSIS — C61 Malignant neoplasm of prostate: Secondary | ICD-10-CM | POA: Diagnosis not present

## 2015-12-11 DIAGNOSIS — E785 Hyperlipidemia, unspecified: Secondary | ICD-10-CM | POA: Diagnosis not present

## 2015-12-11 DIAGNOSIS — E0590 Thyrotoxicosis, unspecified without thyrotoxic crisis or storm: Secondary | ICD-10-CM | POA: Diagnosis not present

## 2015-12-12 DIAGNOSIS — B351 Tinea unguium: Secondary | ICD-10-CM | POA: Diagnosis not present

## 2015-12-12 DIAGNOSIS — E119 Type 2 diabetes mellitus without complications: Secondary | ICD-10-CM | POA: Diagnosis not present

## 2015-12-12 DIAGNOSIS — C61 Malignant neoplasm of prostate: Secondary | ICD-10-CM | POA: Diagnosis not present

## 2015-12-12 DIAGNOSIS — Z510 Encounter for antineoplastic radiation therapy: Secondary | ICD-10-CM | POA: Diagnosis not present

## 2015-12-12 DIAGNOSIS — E0590 Thyrotoxicosis, unspecified without thyrotoxic crisis or storm: Secondary | ICD-10-CM | POA: Diagnosis not present

## 2015-12-12 DIAGNOSIS — E785 Hyperlipidemia, unspecified: Secondary | ICD-10-CM | POA: Diagnosis not present

## 2015-12-13 ENCOUNTER — Ambulatory Visit
Admission: RE | Admit: 2015-12-13 | Discharge: 2015-12-13 | Disposition: A | Discharge status: Home / Self Care | Payer: Medicare Other | Source: Ambulatory Visit | Attending: Radiation Oncology | Admitting: Radiation Oncology

## 2015-12-13 DIAGNOSIS — N186 End stage renal disease: Secondary | ICD-10-CM | POA: Diagnosis not present

## 2015-12-13 DIAGNOSIS — N2581 Secondary hyperparathyroidism of renal origin: Secondary | ICD-10-CM | POA: Diagnosis not present

## 2015-12-13 DIAGNOSIS — E0590 Thyrotoxicosis, unspecified without thyrotoxic crisis or storm: Secondary | ICD-10-CM | POA: Diagnosis not present

## 2015-12-13 DIAGNOSIS — C61 Malignant neoplasm of prostate: Secondary | ICD-10-CM | POA: Diagnosis not present

## 2015-12-13 DIAGNOSIS — D509 Iron deficiency anemia, unspecified: Secondary | ICD-10-CM | POA: Diagnosis not present

## 2015-12-13 DIAGNOSIS — E119 Type 2 diabetes mellitus without complications: Secondary | ICD-10-CM | POA: Diagnosis not present

## 2015-12-13 DIAGNOSIS — D631 Anemia in chronic kidney disease: Secondary | ICD-10-CM | POA: Diagnosis not present

## 2015-12-13 DIAGNOSIS — Z510 Encounter for antineoplastic radiation therapy: Secondary | ICD-10-CM | POA: Diagnosis not present

## 2015-12-13 DIAGNOSIS — B351 Tinea unguium: Secondary | ICD-10-CM | POA: Diagnosis not present

## 2015-12-13 DIAGNOSIS — E785 Hyperlipidemia, unspecified: Secondary | ICD-10-CM | POA: Diagnosis not present

## 2015-12-14 ENCOUNTER — Ambulatory Visit
Admission: RE | Admit: 2015-12-14 | Discharge: 2015-12-14 | Disposition: A | Discharge status: Home / Self Care | Payer: Medicare Other | Source: Ambulatory Visit | Attending: Radiation Oncology | Admitting: Radiation Oncology

## 2015-12-14 DIAGNOSIS — E119 Type 2 diabetes mellitus without complications: Secondary | ICD-10-CM | POA: Diagnosis not present

## 2015-12-14 DIAGNOSIS — B351 Tinea unguium: Secondary | ICD-10-CM | POA: Diagnosis not present

## 2015-12-14 DIAGNOSIS — E785 Hyperlipidemia, unspecified: Secondary | ICD-10-CM | POA: Diagnosis not present

## 2015-12-14 DIAGNOSIS — C61 Malignant neoplasm of prostate: Secondary | ICD-10-CM | POA: Diagnosis not present

## 2015-12-14 DIAGNOSIS — M25561 Pain in right knee: Secondary | ICD-10-CM | POA: Diagnosis not present

## 2015-12-14 DIAGNOSIS — E0590 Thyrotoxicosis, unspecified without thyrotoxic crisis or storm: Secondary | ICD-10-CM | POA: Diagnosis not present

## 2015-12-14 DIAGNOSIS — Z510 Encounter for antineoplastic radiation therapy: Secondary | ICD-10-CM | POA: Diagnosis not present

## 2015-12-15 ENCOUNTER — Ambulatory Visit
Admission: RE | Admit: 2015-12-15 | Discharge: 2015-12-15 | Disposition: A | Discharge status: Home / Self Care | Payer: Medicare Other | Source: Ambulatory Visit | Attending: Radiation Oncology | Admitting: Radiation Oncology

## 2015-12-15 DIAGNOSIS — Z510 Encounter for antineoplastic radiation therapy: Secondary | ICD-10-CM | POA: Diagnosis not present

## 2015-12-15 DIAGNOSIS — D509 Iron deficiency anemia, unspecified: Secondary | ICD-10-CM | POA: Diagnosis not present

## 2015-12-15 DIAGNOSIS — E785 Hyperlipidemia, unspecified: Secondary | ICD-10-CM | POA: Diagnosis not present

## 2015-12-15 DIAGNOSIS — E0590 Thyrotoxicosis, unspecified without thyrotoxic crisis or storm: Secondary | ICD-10-CM | POA: Diagnosis not present

## 2015-12-15 DIAGNOSIS — D631 Anemia in chronic kidney disease: Secondary | ICD-10-CM | POA: Diagnosis not present

## 2015-12-15 DIAGNOSIS — C61 Malignant neoplasm of prostate: Secondary | ICD-10-CM | POA: Diagnosis not present

## 2015-12-15 DIAGNOSIS — E119 Type 2 diabetes mellitus without complications: Secondary | ICD-10-CM | POA: Diagnosis not present

## 2015-12-15 DIAGNOSIS — N2581 Secondary hyperparathyroidism of renal origin: Secondary | ICD-10-CM | POA: Diagnosis not present

## 2015-12-15 DIAGNOSIS — N186 End stage renal disease: Secondary | ICD-10-CM | POA: Diagnosis not present

## 2015-12-15 DIAGNOSIS — B351 Tinea unguium: Secondary | ICD-10-CM | POA: Diagnosis not present

## 2015-12-16 ENCOUNTER — Encounter: Payer: Self-pay | Admitting: Radiation Oncology

## 2015-12-16 ENCOUNTER — Ambulatory Visit
Admission: RE | Admit: 2015-12-16 | Discharge: 2015-12-16 | Disposition: A | Discharge status: Home / Self Care | Payer: Medicare Other | Source: Ambulatory Visit | Attending: Radiation Oncology | Admitting: Radiation Oncology

## 2015-12-16 VITALS — BP 133/69 | HR 81 | Resp 16 | Wt 225.7 lb

## 2015-12-16 DIAGNOSIS — C61 Malignant neoplasm of prostate: Secondary | ICD-10-CM

## 2015-12-16 DIAGNOSIS — E785 Hyperlipidemia, unspecified: Secondary | ICD-10-CM | POA: Diagnosis not present

## 2015-12-16 DIAGNOSIS — Z510 Encounter for antineoplastic radiation therapy: Secondary | ICD-10-CM | POA: Diagnosis not present

## 2015-12-16 DIAGNOSIS — E119 Type 2 diabetes mellitus without complications: Secondary | ICD-10-CM | POA: Diagnosis not present

## 2015-12-16 DIAGNOSIS — E0590 Thyrotoxicosis, unspecified without thyrotoxic crisis or storm: Secondary | ICD-10-CM | POA: Diagnosis not present

## 2015-12-16 DIAGNOSIS — B351 Tinea unguium: Secondary | ICD-10-CM | POA: Diagnosis not present

## 2015-12-16 NOTE — Progress Notes (Addendum)
Weight and vitals stable. Denies pain. Denies nocturia. Denies dysuria or hematuria. Denies incontinence or leakage. Denies diarrhea. Reports fatigue. Goes to dialysis every Tuesday, Thursday and Saturday. Oriented patient to staff and routine of the clinic. Provided patient with RADIATION THERAPY AND YOU handbook then, reviewed pertinent information. Educated patient reference potential side effects and management such as fatigue, urinary/bladder changes, and fatigue. Provided patient with my business card and encouraged him to call with needs. Patient verbalized understanding of all reviewed.    BP 133/69 mmHg  Pulse 81  Resp 16  Wt 225 lb 11.2 oz (102.377 kg)  SpO2 100% Wt Readings from Last 3 Encounters:  12/16/15 225 lb 11.2 oz (102.377 kg)  10/31/15 225 lb (102.059 kg)  10/24/15 228 lb 3.2 oz (103.511 kg)

## 2015-12-16 NOTE — Progress Notes (Signed)
  Radiation Oncology         [phone]   Name: [name] MRN: [id number]   Date: 12/16/2015  DOB: 1951-05-06   Weekly Radiation Therapy Management    ICD-9-CM ICD-10-CM   1. Malignant neoplasm of prostate ([name]) 185 C61     Current Dose: 7.8 Gy  Planned Dose:  78 Gy  Narrative The patient presents for routine under treatment assessment.  Weight and vitals stable. Denies pain. Denies nocturia. Denies dysuria or hematuria. Denies incontinence or leakage. Denies diarrhea. Reports fatigue. Goes to dialysis every Tuesday, Thursday and Saturday.  The patient is without complaint. Set-up films were reviewed. The chart was checked.  Physical Findings  weight is 225 lb 11.2 oz (102.377 kg). His blood pressure is 133/69 and his pulse is 81. His respiration is 16 and oxygen saturation is 100%. . Weight essentially stable.  No significant changes.  Impression The patient is tolerating radiation.  Plan Continue treatment as planned.         Sheral Apley Tammi Klippel, M.D.    This document serves as a record of services personally performed by Tyler Pita, MD. It was created on his behalf by Lendon Collar, a trained medical scribe. The creation of this record is based on the scribe's personal observations and the provider's statements to them. This document has been checked and approved by the attending provider.

## 2015-12-16 NOTE — Addendum Note (Signed)
Encounter addended by: Heywood Footman, RN on: 12/16/2015  5:34 PM<BR>     Documentation filed: Notes Section

## 2015-12-17 DIAGNOSIS — N2581 Secondary hyperparathyroidism of renal origin: Secondary | ICD-10-CM | POA: Diagnosis not present

## 2015-12-17 DIAGNOSIS — D631 Anemia in chronic kidney disease: Secondary | ICD-10-CM | POA: Diagnosis not present

## 2015-12-17 DIAGNOSIS — E119 Type 2 diabetes mellitus without complications: Secondary | ICD-10-CM | POA: Diagnosis not present

## 2015-12-17 DIAGNOSIS — N186 End stage renal disease: Secondary | ICD-10-CM | POA: Diagnosis not present

## 2015-12-17 DIAGNOSIS — D509 Iron deficiency anemia, unspecified: Secondary | ICD-10-CM | POA: Diagnosis not present

## 2015-12-19 ENCOUNTER — Ambulatory Visit
Admission: RE | Admit: 2015-12-19 | Discharge: 2015-12-19 | Disposition: A | Discharge status: Home / Self Care | Payer: Medicare Other | Source: Ambulatory Visit | Attending: Radiation Oncology | Admitting: Radiation Oncology

## 2015-12-19 ENCOUNTER — Other Ambulatory Visit (HOSPITAL_COMMUNITY): Payer: Self-pay | Admitting: Internal Medicine

## 2015-12-19 DIAGNOSIS — E119 Type 2 diabetes mellitus without complications: Secondary | ICD-10-CM | POA: Diagnosis not present

## 2015-12-19 DIAGNOSIS — C61 Malignant neoplasm of prostate: Secondary | ICD-10-CM | POA: Diagnosis not present

## 2015-12-19 DIAGNOSIS — Z510 Encounter for antineoplastic radiation therapy: Secondary | ICD-10-CM | POA: Diagnosis not present

## 2015-12-19 DIAGNOSIS — E0590 Thyrotoxicosis, unspecified without thyrotoxic crisis or storm: Secondary | ICD-10-CM | POA: Diagnosis not present

## 2015-12-19 DIAGNOSIS — E785 Hyperlipidemia, unspecified: Secondary | ICD-10-CM | POA: Diagnosis not present

## 2015-12-19 DIAGNOSIS — B351 Tinea unguium: Secondary | ICD-10-CM | POA: Diagnosis not present

## 2015-12-20 ENCOUNTER — Ambulatory Visit
Admission: RE | Admit: 2015-12-20 | Discharge: 2015-12-20 | Disposition: A | Discharge status: Home / Self Care | Payer: Medicare Other | Source: Ambulatory Visit | Attending: Radiation Oncology | Admitting: Radiation Oncology

## 2015-12-20 VITALS — BP 148/71 | HR 91 | Temp 98.1°F | Resp 16 | Wt 235.1 lb

## 2015-12-20 DIAGNOSIS — N2581 Secondary hyperparathyroidism of renal origin: Secondary | ICD-10-CM | POA: Diagnosis not present

## 2015-12-20 DIAGNOSIS — E785 Hyperlipidemia, unspecified: Secondary | ICD-10-CM | POA: Diagnosis not present

## 2015-12-20 DIAGNOSIS — B351 Tinea unguium: Secondary | ICD-10-CM | POA: Diagnosis not present

## 2015-12-20 DIAGNOSIS — C61 Malignant neoplasm of prostate: Secondary | ICD-10-CM

## 2015-12-20 DIAGNOSIS — D509 Iron deficiency anemia, unspecified: Secondary | ICD-10-CM | POA: Diagnosis not present

## 2015-12-20 DIAGNOSIS — E0590 Thyrotoxicosis, unspecified without thyrotoxic crisis or storm: Secondary | ICD-10-CM | POA: Diagnosis not present

## 2015-12-20 DIAGNOSIS — N186 End stage renal disease: Secondary | ICD-10-CM | POA: Diagnosis not present

## 2015-12-20 DIAGNOSIS — D631 Anemia in chronic kidney disease: Secondary | ICD-10-CM | POA: Diagnosis not present

## 2015-12-20 DIAGNOSIS — R197 Diarrhea, unspecified: Secondary | ICD-10-CM

## 2015-12-20 DIAGNOSIS — Z510 Encounter for antineoplastic radiation therapy: Secondary | ICD-10-CM | POA: Diagnosis not present

## 2015-12-20 DIAGNOSIS — E119 Type 2 diabetes mellitus without complications: Secondary | ICD-10-CM | POA: Diagnosis not present

## 2015-12-20 NOTE — Progress Notes (Signed)
Weight and vitals stable. Patient afebrile.Denies pain. Denies dysuria or hematuria. Denies incontinence or leakage. Reports fatigue. Requesting to be evaluated today for diarrhea. Patient explains at 0100 he woke with diarrhea. He goes onto explain he was "up every 10-15 minutes with diarrhea." Denies taking any antidiarrhea. Denies nausea or vomiting. Reports eating lima beans, ham and potato salad for dinner. Denies anyone in the family is suffering from a GI virus. Denies having an episodes of diarrhea this morning. Had dialysis on Saturday and scheduled for it again today.   BP 148/71 mmHg  Pulse 91  Temp(Src) 98.1 F (36.7 C) (Oral)  Resp 16  Wt 235 lb 1.6 oz (106.641 kg)  SpO2 100% Wt Readings from Last 3 Encounters:  12/20/15 235 lb 1.6 oz (106.641 kg)  12/16/15 225 lb 11.2 oz (102.377 kg)  10/31/15 225 lb (102.059 kg)

## 2015-12-20 NOTE — Progress Notes (Signed)
  Radiation Oncology         [id number]   Name: [name] MRN: [id number]   Date: 12/20/2015  DOB: [birth year]   Weekly Radiation Therapy Management    ICD-9-CM ICD-10-CM   1. Malignant neoplasm of prostate ([name]) 185 C61     Current Dose: 11.7 Gy  Planned Dose:  78 Gy  Narrative The patient presents for a work in visit for diarrhea. He states that he did not eat any new foods, heavy foods, nor does he have any known sick contacts. He states that overnight, about 1am, he experienced several episodes each 10-15 minutes apart for about an hour of loose bowel movements. He reports that he even fell out of bed and bumped his head on the floor when trying to get up to get to the bathroom. He states that he has a knot on his right temple. He denies losing consciousness, blurred vision, double vision, headaches, nausea, or vomiting. He denies any abdominal pain that presented before his diarrhea. He has not taken any cathertics or anti-diarrheals. He denies any fevers or chills. No other complaints are verbalized.   Physical Findings  weight is 235 lb 1.6 oz (106.641 kg). His oral temperature is 98.1 F (36.7 C). His blood pressure is 148/71 and his pulse is 91. His respiration is 16 and oxygen saturation is 100%.   In general this is a well appearing African American male in no acute distress. He's alert and oriented x4 and appropriate throughout the examination. Cardiopulmonary assessment is negative for acute distress and he exhibits normal effort. There is a 2 cm palpable area of fullness that corresponds to his fall along his right temple. No pain is noted with palpation. No skin lacerations or ecchymoses are noted.    Impression  -Stage TIc, adenocarcinoma of the prostate with a Gleason's score of 4+3, and PSA of 5.65   -Isolated episode of diarrhea.  Plan The patient appears to be tolerating his radiation regimen well at this time. We will continue to move forward as previously outlined by  Dr. Tammi Klippel. He will return for his under treatment visit with Dr. Isidore Moos on Friday. I discussed with the patient that his symptoms of diarrhea that he experienced overnight may be true true an unrelated to his radiotherapy regimen as early on as he is in treatment, although not an absolute, versus an infectious source versus an enteritis from something he ate it does not agree with him. The patient's wife will go to the pharmacy today to pick up Imodium, to have on hand if his symptoms occur again. I would hold off on any Imodium unless this returns, as if this is an isolated incident, we would not recommend routine use of the medication.       Carola Rhine, PAC

## 2015-12-21 ENCOUNTER — Ambulatory Visit (INDEPENDENT_AMBULATORY_CARE_PROVIDER_SITE_OTHER): Payer: Medicare Other | Admitting: Neurology

## 2015-12-21 ENCOUNTER — Other Ambulatory Visit (INDEPENDENT_AMBULATORY_CARE_PROVIDER_SITE_OTHER): Payer: Medicare Other

## 2015-12-21 ENCOUNTER — Encounter: Payer: Self-pay | Admitting: Neurology

## 2015-12-21 ENCOUNTER — Ambulatory Visit
Admission: RE | Admit: 2015-12-21 | Discharge: 2015-12-21 | Disposition: A | Discharge status: Home / Self Care | Payer: Medicare Other | Source: Ambulatory Visit | Attending: Radiation Oncology | Admitting: Radiation Oncology

## 2015-12-21 VITALS — BP 124/80 | HR 91 | Wt 224.0 lb

## 2015-12-21 DIAGNOSIS — E119 Type 2 diabetes mellitus without complications: Secondary | ICD-10-CM | POA: Diagnosis not present

## 2015-12-21 DIAGNOSIS — E0590 Thyrotoxicosis, unspecified without thyrotoxic crisis or storm: Secondary | ICD-10-CM | POA: Diagnosis not present

## 2015-12-21 DIAGNOSIS — R413 Other amnesia: Secondary | ICD-10-CM

## 2015-12-21 DIAGNOSIS — M6281 Muscle weakness (generalized): Secondary | ICD-10-CM | POA: Diagnosis not present

## 2015-12-21 DIAGNOSIS — R531 Weakness: Secondary | ICD-10-CM

## 2015-12-21 DIAGNOSIS — G629 Polyneuropathy, unspecified: Secondary | ICD-10-CM

## 2015-12-21 DIAGNOSIS — G609 Hereditary and idiopathic neuropathy, unspecified: Secondary | ICD-10-CM | POA: Diagnosis not present

## 2015-12-21 DIAGNOSIS — M6289 Other specified disorders of muscle: Secondary | ICD-10-CM

## 2015-12-21 DIAGNOSIS — C61 Malignant neoplasm of prostate: Secondary | ICD-10-CM | POA: Diagnosis not present

## 2015-12-21 DIAGNOSIS — Z510 Encounter for antineoplastic radiation therapy: Secondary | ICD-10-CM | POA: Diagnosis not present

## 2015-12-21 DIAGNOSIS — B351 Tinea unguium: Secondary | ICD-10-CM | POA: Diagnosis not present

## 2015-12-21 DIAGNOSIS — E785 Hyperlipidemia, unspecified: Secondary | ICD-10-CM | POA: Diagnosis not present

## 2015-12-21 LAB — SEDIMENTATION RATE: SED RATE: 78 mm/h — AB (ref 0–22)

## 2015-12-21 LAB — VITAMIN B12: VITAMIN B 12: 477 pg/mL (ref 211–911)

## 2015-12-21 NOTE — Progress Notes (Signed)
NEUROLOGY CONSULTATION NOTE  [name] MRN: [id number] DOB: 15-Mar-1951  Referring provider: Dr. [name] Primary care provider: Dr. [name]  Reason for consult:  Dementia, headache  Dear Dr [name]:  Thank you for your kind referral of [name] for consultation of the above symptoms. Although his history is well known to you, please allow me to reiterate it for the purpose of our medical record. The patient was accompanied to the clinic by his wife who also provides collateral information. Records and images were personally reviewed where available.  HISTORY OF PRESENT ILLNESS: This is a 65 year old left-handed man with multiple medical issues, including hypertension, hyperlipidemia, diabetes, CKD on dialysis, neuropathy, prostate cancer currently on radiation therapy for the past few weeks, presenting for evaluation of memory changes. He denies any headaches. He reports his memory is not as good as it used to be, but "has been like this all my life." He has difficulty with name recall but remembers it 30-60 minutes after. When asked about misplacing things, he reports "my wife says I don't look for them, then they would be right in front of me." He and his wife would argue during the visit. She reports that memory change is "selective, sometimes he can remember what he wants to," then they get into arguments when she tells him he already told her something. She started noticing memory changes for a few years now. She does most of the driving now, but he denied getting lost in the past. He manages his ow medications, but his wife has to remind him what they are for. His wife is in charge of bills. She reports he is "getting meaner." His wife is concerned he has MS due to the weakness he has been reporting. He reports generalized weakness, but worse in his left hand/arm. He has to struggle getting things from above his head. He has difficulty going up stairs. He has frequent falls,  reporting 2-3 falls in the past 3 months. His wife reminds him he fell in the kitchen the other day. He becomes irritated and states he would just trip over something. They reports his knees just buckle out. He feels his left leg is weaker. He denies any neck pain, he has chronic back pain and "feels beaten up" in his lower back and legs. He denies any numbness/tingling, bowel/bladder dysfunction. He has frequent dizziness, mostly when getting up quickly. He goes for dialysis 3 times a week for the past 5-6 years. He started radiation for prostate cancer last 12/13/15. There is no family history of memory loss, he denies any head injuries or alcohol use.   Laboratory Data: Lab Results  Component Value Date   WBC 6.1 10/31/2015   HGB 11.6* 10/31/2015   HCT 37.8* 10/31/2015   MCV 87.5 10/31/2015   PLT 325.0 10/31/2015     Chemistry      Component Value Date/Time   NA 142 09/10/2015 2037   K 3.6 09/10/2015 2037   CL 101 09/10/2015 2037   CO2 31 09/10/2015 2037   BUN 16 09/10/2015 2037   CREATININE 6.44* 09/10/2015 2037      Component Value Date/Time   CALCIUM 9.1 09/10/2015 2037   ALKPHOS 73 10/31/2015 1152   AST 16 10/31/2015 1152   ALT 14 10/31/2015 1152   BILITOT 0.8 10/31/2015 1152     Lab Results  Component Value Date   HGBA1C 4.7 10/31/2015   Lab Results  Component Value Date  TSH 0.38 10/31/2015     PAST MEDICAL HISTORY: Past Medical History  Diagnosis Date  . Hyperthyroidism   . GASTROENTERITIS, VIRAL 10/14/2009  . ONYCHOMYCOSIS, TOENAILS 12/26/2007  . GOITER, MULTINODULAR 12/26/2007  . HYPERTHYROIDISM 02/02/2010  . DIABETES MELLITUS, TYPE II 02/01/2010  . DYSLIPIDEMIA 06/18/2007  . GOUT 06/18/2007  . Hypocalcemia 06/07/2010  . DEPRESSION 10/14/2009  . PERIPHERAL NEUROPATHY 06/18/2007  . HYPERTENSION 06/18/2007  . CONGESTIVE HEART FAILURE 06/18/2007  . DIASTOLIC HEART FAILURE, CHRONIC 02/06/2009  . Unspecified hypotension 01/30/2010  . PULMONARY NODULE, RIGHT LOWER LOBE  06/08/2009  . GERD 06/18/2007  . BENIGN PROSTATIC HYPERTROPHY 10/14/2009  . GYNECOMASTIA 07/17/2010  . NECK PAIN 07/31/2010  . FOOT PAIN 08/12/2008  . DIZZINESS 07/17/2010  . Other malaise and fatigue 11/24/2009  . GAIT DISTURBANCE 03/03/2010  . DYSPNEA 10/29/2008  . CHEST PAIN 03/29/2010  . TRANSAMINASES, SERUM, ELEVATED 02/01/2010  . COLONIC POLYPS, HX OF 10/14/2009  . Anemia 06/16/2011  . Ischemic cardiomyopathy 06/16/2011  . Hyperparathyroidism, secondary ([place]) 06/16/2011  . OSA on CPAP 10/16/2011  . Hyperlipidemia 10/16/2011  . CAD, NATIVE VESSEL 02/06/2009    saw Dr. Missy Sabins last jan 2013  . Sleep apnea     cpap machine and o2  . Allergic rhinitis, cause unspecified 02/24/2014  . Transfusion history     none recent  . Hemodialysis access, fistula mature [facility])     Dialysis T-Th-Sa ([place]) Right upper arm fistula  . ESRD (end stage renal disease) on dialysis ([place]) 08/04/2010    "TTS;  " (04/18/2015)  . Dementia [id number]  . Renal insufficiency   . Depression 09/24/2015  . Prostate cancer [facility])     PAST SURGICAL HISTORY: Past Surgical History  Procedure Laterality Date  . Coronary angioplasty with stent placement  06/11/2008  . Cervical spine surgery  2/09    "to repair nerve problems in my left arm"  . Back surgery  1998  . Esophagogastroduodenoscopy  09/28/2011    Procedure: ESOPHAGOGASTRODUODENOSCOPY (EGD);  Surgeon: Missy Sabins, MD;  Location: [facility] ENDOSCOPY;  Service: Endoscopy;  Laterality: N/A;  . Givens capsule study  09/30/2011    Procedure: GIVENS CAPSULE STUDY;  Surgeon: Jeryl Columbia, MD;  Location: [facility] ENDOSCOPY;  Service: Endoscopy;  Laterality: N/A;  . Cholecystectomy    . Coronary angioplasty with stent placement  06/2007    TAXUS stent to RCA/notes 01/31/2011  . Tonsillectomy    . Av fistula placement  11/07/2011    Procedure: INSERTION OF ARTERIOVENOUS (AV) GORE-TEX GRAFT ARM;  Surgeon: Tinnie Gens, MD;  Location: [place];  Service: Vascular;   Laterality: Left;  . Arteriovenous graft placement Right 2009    forearm/notes 02/01/2011  . Insertion of dialysis catheter Right 2014  . Insertion of dialysis catheter Left 02/11/2013    Procedure: INSERTION OF DIALYSIS CATHETER;  Surgeon: Conrad Portales, MD;  Location: [place];  Service: Vascular;  Laterality: Left;  Ultrasound guided  . Removal of a dialysis catheter Right 02/11/2013    Procedure: REMOVAL OF A DIALYSIS CATHETER;  Surgeon: Conrad Allendale, MD;  Location: [place];  Service: Vascular;  Laterality: Right;  . Bascilic vein transposition Right 02/27/2013    Procedure: BASCILIC VEIN TRANSPOSITION;  Surgeon: Mal Misty, MD;  Location: [place];  Service: Vascular;  Laterality: Right;  Right Basilic Vein Transposition   . Shuntogram N/A 09/20/2011    Procedure: Earney Mallet;  Surgeon: Conrad Delshire, MD;  Location: [facility] CATH LAB;  Service: Cardiovascular;  Laterality: N/A;  . Venogram N/A 01/26/2013    Procedure: VENOGRAM;  Surgeon: Angelia Mould, MD;  Location: [facility] CATH LAB;  Service: Cardiovascular;  Laterality: N/A;  . Esophagogastroduodenoscopy N/A 04/07/2015    Procedure: ESOPHAGOGASTRODUODENOSCOPY (EGD);  Surgeon: Teena Irani, MD;  Location: [facility] ENDOSCOPY;  Service: Endoscopy;  Laterality: N/A;  . Savory dilation N/A 04/07/2015    Procedure: SAVORY DILATION;  Surgeon: Teena Irani, MD;  Location: [facility] ENDOSCOPY;  Service: Endoscopy;  Laterality: N/A;  . Foreign body removal  09/2003    via upper endoscopy/notes 02/12/2011  . Esophagogastroduodenoscopy N/A 04/19/2015    Procedure: ESOPHAGOGASTRODUODENOSCOPY (EGD);  Surgeon: Arta Silence, MD;  Location: [facility] ENDOSCOPY;  Service: Endoscopy;  Laterality: N/A;  . Total knee arthroplasty Right 08/02/2015    Procedure: TOTAL KNEE ARTHROPLASTY;  Surgeon: Renette Butters, MD;  Location: [place];  Service: Orthopedics;  Laterality: Right;    MEDICATIONS: Current Outpatient Prescriptions on File Prior to Visit  Medication Sig Dispense Refill  . acetaminophen  (TYLENOL) 325 MG tablet Take 1 tablet (325 mg total) by mouth every 4 (four) hours as needed for headache or mild pain.    [name] allopurinol (ZYLOPRIM) 100 MG tablet Take 1 tablet (100 mg total) by mouth daily. (Patient taking differently: Take 100 mg by mouth daily as needed (gout). ) 30 tablet 11  . amLODipine (NORVASC) 10 MG tablet     . calcium-vitamin D (OSCAL WITH D) 500-200 MG-UNIT per tablet Take 3 tablets by mouth 3 (three) times daily with meals.     . carvedilol (COREG) 12.5 MG tablet Take 12.5 mg by mouth See admin instructions. Take 1 tablet (12.5 mg) by mouth on Sunday, Monday, Wednesday and Friday morning, take 1 tablet (12.5 mg) daily at 9pm    . cinacalcet (SENSIPAR) 60 MG tablet Take 60 mg by mouth 2 (two) times daily.     . citalopram (CELEXA) 10 MG tablet Take 1 tablet (10 mg total) by mouth daily. 90 tablet 3  . colesevelam (WELCHOL) 625 MG tablet Take 3 tablets (1,875 mg total) by mouth 2 (two) times daily with a meal. 180 tablet 11  . dexlansoprazole (DEXILANT) 60 MG capsule Take 60 mg by mouth daily as needed (acid reflux).    [name] docusate sodium (COLACE) 100 MG capsule Take 1 capsule (100 mg total) by mouth 2 (two) times daily. (Patient taking differently: Take 100 mg by mouth 2 (two) times daily as needed (pain). ) 10 capsule 0  . gabapentin (NEURONTIN) 300 MG capsule 1-2 tabs by mouth at bedtime for sleep and pain 180 capsule 3  . HYDROcodone-acetaminophen (NORCO/VICODIN) 5-325 MG tablet Take 1 tablet by mouth every 6 (six) hours as needed for severe pain. 30 tablet 0  . lanthanum (FOSRENOL) 1000 MG chewable tablet Chew 1,000 mg by mouth 2 (two) times daily with a meal.     . Multiple Vitamins-Minerals (MULTIVITAMIN WITH MINERALS) tablet Take by mouth. Reported on 10/24/2015    . multivitamin (RENA-VIT) TABS tablet Take 1 tablet by mouth daily. Reported on 10/24/2015    . ondansetron (ZOFRAN) 4 MG tablet Take 1 tablet (4 mg total) by mouth every 8 (eight) hours as needed for nausea  or vomiting. 20 tablet 0  . ZETIA 10 MG tablet TAKE ONE TABLET BY MOUTH ONCE DAILY 30 tablet 0   No current facility-administered medications on file prior to visit.    ALLERGIES: Allergies  Allergen Reactions  . Cephalexin Swelling and Other (See Comments)  Tongue swelling  . Statins Other (See Comments)    Weak muscles  . Ciprofloxacin Rash    FAMILY HISTORY: Family History  Problem Relation Age of Onset  . Heart disease Father   . Diabetes Father   . Kidney failure Father   . Hypertension Father   . Heart disease Sister   . Cancer Neg Hx     SOCIAL HISTORY: Social History   Social History  . Marital Status: Married    Spouse Name: N/A  . Number of Children: N/A  . Years of Education: N/A   Occupational History  . disabled   . formerly Lawyer for [employer].    Social History Main Topics  . Smoking status: Former Smoker -- 1.00 packs/day for 25 years    Types: Cigarettes    Quit date: 10/01/2005  . Smokeless tobacco: Former Systems developer    Quit date: 10/01/2005     Comment: Quit smoking 2007 Smoked x 25 years 1/2 ppd.  . Alcohol Use: 0.0 oz/week    0 Standard drinks or equivalent per week     Comment: occassional   . Drug Use: No  . Sexual Activity: No   Other Topics Concern  . Not on file   Social History Narrative    REVIEW OF SYSTEMS: Constitutional: No fevers, chills, or sweats, + generalized fatigue, no change in appetite Eyes: No visual changes, double vision, eye pain Ear, nose and throat: No hearing loss, ear pain, nasal congestion, sore throat Cardiovascular: No chest pain, palpitations Respiratory:  No shortness of breath at rest or with exertion, wheezes GastrointestinaI: No nausea, vomiting, diarrhea, abdominal pain, fecal incontinence Genitourinary:  No dysuria, urinary retention or frequency Musculoskeletal:  No neck pain, +back pain Integumentary: No rash, pruritus, skin lesions Neurological: as above Psychiatric: No  depression, insomnia, anxiety Endocrine: No palpitations, fatigue, diaphoresis, mood swings, change in appetite, change in weight, increased thirst Hematologic/Lymphatic:  No anemia, purpura, petechiae. Allergic/Immunologic: no itchy/runny eyes, nasal congestion, recent allergic reactions, rashes  PHYSICAL EXAM: Filed Vitals:   12/21/15 1403  BP: 124/80  Pulse: 91   General: No acute distress Head:  Normocephalic/atraumatic Eyes: Fundoscopic exam shows bilateral sharp discs, no vessel changes, exudates, or hemorrhages Neck: supple, no paraspinal tenderness, full range of motion Back: No paraspinal tenderness Heart: regular rate and rhythm Lungs: Clear to auscultation bilaterally. Vascular: No carotid bruits. Skin/Extremities: No rash, no edema Neurological Exam: Mental status: alert and oriented to person, place, and time, no dysarthria or aphasia, Fund of knowledge is appropriate.  Recent and remote memory are intact.  Attention and concentration are normal.    Able to name objects and repeat phrases. CDT 5/5 MMSE - Mini Mental State Exam 12/21/2015  Orientation to time 5  Orientation to Place 5  Registration 3  Attention/ Calculation 5  Recall 0  Language- name 2 objects 2  Language- repeat 1  Language- follow 3 step command 3  Language- read & follow direction 1  Write a sentence 1  Copy design 1  Total score 27   Cranial nerves: CN I: not tested CN II: pupils equal, round and reactive to light, visual fields intact, fundi unremarkable. CN III, IV, VI:  full range of motion, no nystagmus, no ptosis CN V: facial sensation intact CN VII: upper and lower face symmetric CN VIII: hearing intact to finger rub CN IX, X: gag intact, uvula midline CN XI: sternocleidomastoid and trapezius muscles intact CN XII: tongue midline Bulk & Tone: normal, no  fasciculations. Motor: 5/5 throughout with no pronator drift. Sensation: decreased cold to knees bilaterally, decreased vibration  to knees bilaterally, L>R, decreased pin to left ankle, intact to all modalities on both UE, intact joint position sense.  No extinction to double simultaneous stimulation.  Romberg test slight sway Deep Tendon Reflexes: unable to elicit reflexes throughout Plantar responses: downgoing bilaterally Cerebellar: no incoordination on finger to nose, heel to shin. No dysdiadochokinesia Gait: narrow-based and steady, able to tandem walk adequately. Tremor: none  IMPRESSION: This is a 65 year old left-handed man with multiple medical issues, including hypertension, hyperlipidemia, diabetes, CKD on dialysis, neuropathy, prostate cancer currently on radiation therapy for the past few weeks, cervical spine surgery, presenting for evaluation of memory changes and weakness. MMSE today is normal 27/30. Neurological exam shows evidence of a length-dependent neuropathy, like due to diabetes. We discussed different causes of memory loss, check B12. Bloodwork for ESR and SPEP/IFE for neuropathy will be ordered as well. MRI brain without contrast will be ordered to assess for underlying structural abnormality. He is unable to have contrast due to CKD. We discussed weakness, it appears generalized which may be due to systemic/metabolic cause, however they report that his left arm and leg are weaker. EMG/NCV of the left UE and LE will be ordered to further evaluate his symptoms. No indication to start cholinesterase inhibitors at this time. Continue to monitor mood, this may be affecting his cognition as well. He will follow-up in 6 months.   Thank you for allowing me to participate in the care of this patient. Please do not hesitate to call for any questions or concerns.   Ellouise Newer, M.D.  CC: Dr. [name]

## 2015-12-21 NOTE — Patient Instructions (Addendum)
1. Schedule MRI brain without contrast 2. Bloodwork for B12, ESR, SPEP/IFE 3. Schedule EMG/NCV for left UE and LE 4. Follow-up in 6 months  5. YOU HAVE BEEN SCHEDULED @ [facility] FOR MRI ON 01/25/16.   PLS ARRIVE @ 2:00PM.    [address]    [place], [place] [address]  [phone]

## 2015-12-22 ENCOUNTER — Ambulatory Visit
Admission: RE | Admit: 2015-12-22 | Discharge: 2015-12-22 | Disposition: A | Discharge status: Home / Self Care | Payer: Medicare Other | Source: Ambulatory Visit | Attending: Radiation Oncology | Admitting: Radiation Oncology

## 2015-12-22 DIAGNOSIS — D509 Iron deficiency anemia, unspecified: Secondary | ICD-10-CM | POA: Diagnosis not present

## 2015-12-22 DIAGNOSIS — B351 Tinea unguium: Secondary | ICD-10-CM | POA: Diagnosis not present

## 2015-12-22 DIAGNOSIS — E785 Hyperlipidemia, unspecified: Secondary | ICD-10-CM | POA: Diagnosis not present

## 2015-12-22 DIAGNOSIS — E119 Type 2 diabetes mellitus without complications: Secondary | ICD-10-CM | POA: Diagnosis not present

## 2015-12-22 DIAGNOSIS — R413 Other amnesia: Secondary | ICD-10-CM | POA: Insufficient documentation

## 2015-12-22 DIAGNOSIS — N2581 Secondary hyperparathyroidism of renal origin: Secondary | ICD-10-CM | POA: Diagnosis not present

## 2015-12-22 DIAGNOSIS — N186 End stage renal disease: Secondary | ICD-10-CM | POA: Diagnosis not present

## 2015-12-22 DIAGNOSIS — C61 Malignant neoplasm of prostate: Secondary | ICD-10-CM | POA: Diagnosis not present

## 2015-12-22 DIAGNOSIS — E0590 Thyrotoxicosis, unspecified without thyrotoxic crisis or storm: Secondary | ICD-10-CM | POA: Diagnosis not present

## 2015-12-22 DIAGNOSIS — G629 Polyneuropathy, unspecified: Secondary | ICD-10-CM | POA: Insufficient documentation

## 2015-12-22 DIAGNOSIS — R531 Weakness: Secondary | ICD-10-CM | POA: Insufficient documentation

## 2015-12-22 DIAGNOSIS — Z510 Encounter for antineoplastic radiation therapy: Secondary | ICD-10-CM | POA: Diagnosis not present

## 2015-12-22 DIAGNOSIS — D631 Anemia in chronic kidney disease: Secondary | ICD-10-CM | POA: Diagnosis not present

## 2015-12-23 ENCOUNTER — Encounter: Payer: Self-pay | Admitting: Internal Medicine

## 2015-12-23 ENCOUNTER — Ambulatory Visit
Admission: RE | Admit: 2015-12-23 | Discharge: 2015-12-23 | Disposition: A | Discharge status: Home / Self Care | Payer: Medicare Other | Source: Ambulatory Visit | Attending: Radiation Oncology | Admitting: Radiation Oncology

## 2015-12-23 ENCOUNTER — Encounter: Payer: Self-pay | Admitting: Radiation Oncology

## 2015-12-23 ENCOUNTER — Ambulatory Visit (INDEPENDENT_AMBULATORY_CARE_PROVIDER_SITE_OTHER): Payer: Medicare Other | Admitting: Internal Medicine

## 2015-12-23 VITALS — BP 122/80 | HR 97 | Temp 98.3°F | Resp 20 | Wt 224.0 lb

## 2015-12-23 VITALS — BP 96/52 | HR 87 | Temp 98.2°F | Ht 73.0 in | Wt 224.6 lb

## 2015-12-23 DIAGNOSIS — F0390 Unspecified dementia without behavioral disturbance: Secondary | ICD-10-CM

## 2015-12-23 DIAGNOSIS — B351 Tinea unguium: Secondary | ICD-10-CM | POA: Diagnosis not present

## 2015-12-23 DIAGNOSIS — C61 Malignant neoplasm of prostate: Secondary | ICD-10-CM | POA: Diagnosis not present

## 2015-12-23 DIAGNOSIS — E119 Type 2 diabetes mellitus without complications: Secondary | ICD-10-CM | POA: Diagnosis not present

## 2015-12-23 DIAGNOSIS — I5032 Chronic diastolic (congestive) heart failure: Secondary | ICD-10-CM

## 2015-12-23 DIAGNOSIS — J309 Allergic rhinitis, unspecified: Secondary | ICD-10-CM | POA: Diagnosis not present

## 2015-12-23 DIAGNOSIS — I110 Hypertensive heart disease with heart failure: Secondary | ICD-10-CM

## 2015-12-23 DIAGNOSIS — E785 Hyperlipidemia, unspecified: Secondary | ICD-10-CM | POA: Diagnosis not present

## 2015-12-23 DIAGNOSIS — Z510 Encounter for antineoplastic radiation therapy: Secondary | ICD-10-CM | POA: Diagnosis not present

## 2015-12-23 DIAGNOSIS — E0590 Thyrotoxicosis, unspecified without thyrotoxic crisis or storm: Secondary | ICD-10-CM | POA: Diagnosis not present

## 2015-12-23 LAB — PROTEIN ELECTROPHORESIS, SERUM
ALPHA-1-GLOBULIN: 0.4 g/dL — AB (ref 0.2–0.3)
ALPHA-2-GLOBULIN: 1.1 g/dL — AB (ref 0.5–0.9)
Albumin ELP: 4.4 g/dL (ref 3.8–4.8)
BETA 2: 0.5 g/dL (ref 0.2–0.5)
Beta Globulin: 0.4 g/dL (ref 0.4–0.6)
GAMMA GLOBULIN: 1 g/dL (ref 0.8–1.7)
TOTAL PROTEIN, SERUM ELECTROPHOR: 7.8 g/dL (ref 6.1–8.1)

## 2015-12-23 LAB — IMMUNOFIXATION ELECTROPHORESIS
IGA: 204 mg/dL (ref 68–379)
IgG (Immunoglobin G), Serum: 1080 mg/dL (ref 650–1600)
IgM, Serum: 112 mg/dL (ref 41–251)

## 2015-12-23 MED ORDER — TRIAMCINOLONE ACETONIDE 55 MCG/ACT NA AERO: 2.0000 | INHALATION_SPRAY | Freq: Every day | NASAL | Status: DC

## 2015-12-23 MED ORDER — CETIRIZINE HCL 10 MG PO TABS: 10.0000 mg | ORAL_TABLET | Freq: Every day | ORAL | Status: DC

## 2015-12-23 NOTE — Progress Notes (Signed)
Subjective:    Patient ID: [name], male    DOB: 1951-08-26, 65 y.o.   MRN: [id number]  HPI  Here to f/u; overall doing ok,  Pt denies chest pain, increasing sob or doe, wheezing, orthopnea, PND, increased LE swelling, palpitations, or syncope.  Pt denies new neurological symptoms such as new headache, or facial or extremity weakness or numbness.  Pt denies polydipsia, polyuria, or low sugar episode.   Pt denies new neurological symptoms such as new headache, or facial or extremity weakness or numbness.   Pt states overall good compliance with meds.  Does have several wks ongoing nasal allergy symptoms with clearish congestion, itch and sneezing, without fever, pain, ST, cough, swelling or wheezing. Gets lightheaded with turning over in bed,  Does c/o ongoing fatigue, but denies signficant daytime hypersomnolence. Pt continues to have recurring LBP without change in severity, bowel or bladder change, fever, wt loss,  worsening LE pain/numbness/weakness, gait change or falls.  Denies worsening depressive symptoms, suicidal ideation, or panic. Dementia overall stable symptomatically with gradual worsening at best, and not assoc with behavioral changes such as hallucinations, paranoia Past Medical History  Diagnosis Date  . Hyperthyroidism   . GASTROENTERITIS, VIRAL 10/14/2009  . ONYCHOMYCOSIS, TOENAILS 12/26/2007  . GOITER, MULTINODULAR 12/26/2007  . HYPERTHYROIDISM 02/02/2010  . DIABETES MELLITUS, TYPE II 02/01/2010  . DYSLIPIDEMIA 06/18/2007  . GOUT 06/18/2007  . Hypocalcemia 06/07/2010  . DEPRESSION 10/14/2009  . PERIPHERAL NEUROPATHY 06/18/2007  . HYPERTENSION 06/18/2007  . CONGESTIVE HEART FAILURE 06/18/2007  . DIASTOLIC HEART FAILURE, CHRONIC 02/06/2009  . Unspecified hypotension 01/30/2010  . PULMONARY NODULE, RIGHT LOWER LOBE 06/08/2009  . GERD 06/18/2007  . BENIGN PROSTATIC HYPERTROPHY 10/14/2009  . GYNECOMASTIA 07/17/2010  . NECK PAIN 07/31/2010  . FOOT PAIN 08/12/2008  . DIZZINESS 07/17/2010    . Other malaise and fatigue 11/24/2009  . GAIT DISTURBANCE 03/03/2010  . DYSPNEA 10/29/2008  . CHEST PAIN 03/29/2010  . TRANSAMINASES, SERUM, ELEVATED 02/01/2010  . COLONIC POLYPS, HX OF 10/14/2009  . Anemia 06/16/2011  . Ischemic cardiomyopathy 06/16/2011  . Hyperparathyroidism, secondary ([place]) 06/16/2011  . OSA on CPAP 10/16/2011  . Hyperlipidemia 10/16/2011  . CAD, NATIVE VESSEL 02/06/2009    saw Dr. Missy Sabins last jan 2013  . Sleep apnea     cpap machine and o2  . Allergic rhinitis, cause unspecified 02/24/2014  . Transfusion history     none recent  . Hemodialysis access, fistula mature [facility])     Dialysis T-Th-Sa ([place]) Right upper arm fistula  . ESRD (end stage renal disease) on dialysis ([place]) 08/04/2010    "TTS;  " (04/18/2015)  . Dementia [id number]  . Renal insufficiency   . Depression 09/24/2015  . Prostate cancer [facility])    Past Surgical History  Procedure Laterality Date  . Coronary angioplasty with stent placement  06/11/2008  . Cervical spine surgery  2/09    "to repair nerve problems in my left arm"  . Back surgery  1998  . Esophagogastroduodenoscopy  09/28/2011    Procedure: ESOPHAGOGASTRODUODENOSCOPY (EGD);  Surgeon: Missy Sabins, MD;  Location[facility] ENDOSCOPY;  Service: Endoscopy;  Laterality: N/A;  . Givens capsule study  09/30/2011    Procedure: GIVENS CAPSULE STUDY;  Surgeon: Jeryl Columbia, MD;  Location: [facility] ENDOSCOPY;  Service: Endoscopy;  Laterality: N/A;  . Cholecystectomy    . Coronary angioplasty with stent placement  06/2007    TAXUS stent to RCA/notes 01/31/2011  . Tonsillectomy    .  Av fistula placement  11/07/2011    Procedure: INSERTION OF ARTERIOVENOUS (AV) GORE-TEX GRAFT ARM;  Surgeon: Tinnie Gens, MD;  Location: [place];  Service: Vascular;  Laterality: Left;  . Arteriovenous graft placement Right 2009    forearm/notes 02/01/2011  . Insertion of dialysis catheter Right 2014  . Insertion of dialysis catheter Left 02/11/2013    Procedure:  INSERTION OF DIALYSIS CATHETER;  Surgeon: Conrad Groveland, MD;  Location: [place];  Service: Vascular;  Laterality: Left;  Ultrasound guided  . Removal of a dialysis catheter Right 02/11/2013    Procedure: REMOVAL OF A DIALYSIS CATHETER;  Surgeon: Conrad Harrison, MD;  Location: [place];  Service: Vascular;  Laterality: Right;  . Bascilic vein transposition Right 02/27/2013    Procedure: BASCILIC VEIN TRANSPOSITION;  Surgeon: Mal Misty, MD;  Location: [place];  Service: Vascular;  Laterality: Right;  Right Basilic Vein Transposition   . Shuntogram N/A 09/20/2011    Procedure: Earney Mallet;  Surgeon: Conrad Elroy, MD;  Location: [facility] CATH LAB;  Service: Cardiovascular;  Laterality: N/A;  . Venogram N/A 01/26/2013    Procedure: VENOGRAM;  Surgeon: Angelia Mould, MD;  Location: [facility] CATH LAB;  Service: Cardiovascular;  Laterality: N/A;  . Esophagogastroduodenoscopy N/A 04/07/2015    Procedure: ESOPHAGOGASTRODUODENOSCOPY (EGD);  Surgeon: Teena Irani, MD;  Location: [facility] ENDOSCOPY;  Service: Endoscopy;  Laterality: N/A;  . Savory dilation N/A 04/07/2015    Procedure: SAVORY DILATION;  Surgeon: Teena Irani, MD;  Location: [facility] ENDOSCOPY;  Service: Endoscopy;  Laterality: N/A;  . Foreign body removal  09/2003    via upper endoscopy/notes 02/12/2011  . Esophagogastroduodenoscopy N/A 04/19/2015    Procedure: ESOPHAGOGASTRODUODENOSCOPY (EGD);  Surgeon: Arta Silence, MD;  Location: [facility] ENDOSCOPY;  Service: Endoscopy;  Laterality: N/A;  . Total knee arthroplasty Right 08/02/2015    Procedure: TOTAL KNEE ARTHROPLASTY;  Surgeon: Renette Butters, MD;  Location: [place];  Service: Orthopedics;  Laterality: Right;    reports that he quit smoking about 10 years ago. His smoking use included Cigarettes. He has a 25 pack-year smoking history. He quit smokeless tobacco use about 10 years ago. He reports that he does not drink alcohol or use illicit drugs. family history includes Diabetes in his father; Heart disease in his father and  sister; Hypertension in his father; Kidney failure in his father. There is no history of Cancer. Allergies  Allergen Reactions  . Cephalexin Swelling and Other (See Comments)    Tongue swelling  . Statins Other (See Comments)    Weak muscles  . Ciprofloxacin Rash   Current Outpatient Prescriptions on File Prior to Visit  Medication Sig Dispense Refill  . acetaminophen (TYLENOL) 325 MG tablet Take 1 tablet (325 mg total) by mouth every 4 (four) hours as needed for headache or mild pain.    [name] allopurinol (ZYLOPRIM) 100 MG tablet Take 1 tablet (100 mg total) by mouth daily. (Patient taking differently: Take 100 mg by mouth daily as needed (gout). ) 30 tablet 11  . amLODipine (NORVASC) 10 MG tablet     . carvedilol (COREG) 12.5 MG tablet Take 12.5 mg by mouth See admin instructions. Take 1 tablet (12.5 mg) by mouth on Sunday, Monday, Wednesday and Friday morning, take 1 tablet (12.5 mg) daily at 9pm    . cinacalcet (SENSIPAR) 60 MG tablet Take 60 mg by mouth 2 (two) times daily.     . citalopram (CELEXA) 10 MG tablet Take 1 tablet (10 mg total) by mouth daily.  90 tablet 3  . colesevelam (WELCHOL) 625 MG tablet Take 3 tablets (1,875 mg total) by mouth 2 (two) times daily with a meal. 180 tablet 11  . gabapentin (NEURONTIN) 300 MG capsule 1-2 tabs by mouth at bedtime for sleep and pain 180 capsule 3  . lanthanum (FOSRENOL) 1000 MG chewable tablet Chew 1,000 mg by mouth 2 (two) times daily with a meal. Reported on 12/23/2015    . multivitamin (RENA-VIT) TABS tablet Take 1 tablet by mouth daily. Reported on 10/24/2015    . ZETIA 10 MG tablet TAKE ONE TABLET BY MOUTH ONCE DAILY 30 tablet 0   No current facility-administered medications on file prior to visit.   Review of Systems  Constitutional: Negative for unusual diaphoresis or night sweats HENT: Negative for ringing in ear or discharge Eyes: Negative for double vision or worsening visual disturbance.  Respiratory: Negative for choking and  stridor.   Gastrointestinal: Negative for vomiting or other signifcant bowel change Genitourinary: Negative for hematuria or change in urine volume.  Musculoskeletal: Negative for other MSK pain or swelling Skin: Negative for color change and worsening wound.  Neurological: Negative for tremors and numbness other than noted  Psychiatric/Behavioral: Negative for decreased concentration or agitation other than above       Objective:   Physical Exam BP 122/80 mmHg  Pulse 97  Temp(Src) 98.3 F (36.8 C) (Oral)  Resp 20  Wt 224 lb (101.606 kg)  SpO2 90% VS noted,  Constitutional: Pt appears in no significant distress HENT: Head: NCAT.  Right Ear: External ear normal.  Left Ear: External ear normal.  Eyes: . Pupils are equal, round, and reactive to light. Conjunctivae and EOM are normal Neck: Normal range of motion. Neck supple.  Cardiovascular: Normal rate and regular rhythm.   Pulmonary/Chest: Effort normal and breath sounds without rales or wheezing.  Abd:  Soft, NT, ND, + BS Neurological: Pt is alert. At baseline confused , motor grossly intact Skin: Skin is warm. No rash, no LE edema Psychiatric: Pt behavior is normal. No agitation.     Assessment & Plan:

## 2015-12-23 NOTE — Patient Instructions (Signed)
Please take all new medication as prescribed - the nasacort and zyrtec as directed  Please continue all other medications as before, and refills have been done if requested.  Please have the pharmacy call with any other refills you may need.  Please keep your appointments with your specialists as you may have planned

## 2015-12-23 NOTE — Assessment & Plan Note (Signed)
Mild to mod, for zyrtec/nasacort asd,  to f/u any worsening symptoms or concerns 

## 2015-12-23 NOTE — Assessment & Plan Note (Signed)
stable overall by history and exam, recent data reviewed with pt, and pt to continue medical treatment as before,  to f/u any worsening symptoms or concerns BP Readings from Last 3 Encounters:  12/23/15 122/80  12/23/15 96/52  12/21/15 124/80

## 2015-12-23 NOTE — Assessment & Plan Note (Signed)
stable overall by history and exam, recent data reviewed with pt, and pt to continue medical treatment as before,  to f/u any worsening symptoms or concerns Lab Results  Component Value Date   WBC 6.1 10/31/2015   HGB 11.6* 10/31/2015   HCT 37.8* 10/31/2015   PLT 325.0 10/31/2015   GLUCOSE 127* 09/10/2015   CHOL 263* 07/16/2014   TRIG * 07/16/2014    409.0 Triglyceride is over 400; calculations on Lipids are invalid.   HDL 27.90* 07/16/2014   LDLDIRECT 136.0 07/16/2014   LDLCALC 185* 01/13/2014   ALT 14 10/31/2015   AST 16 10/31/2015   NA 142 09/10/2015   K 3.6 09/10/2015   CL 101 09/10/2015   CREATININE 6.44* 09/10/2015   BUN 16 09/10/2015   CO2 31 09/10/2015   TSH 0.38 10/31/2015   PSA 3.67 08/30/2014   INR 1.04 07/20/2015   HGBA1C 4.7 10/31/2015

## 2015-12-23 NOTE — Progress Notes (Signed)
[name] presents for his 9th fraction of radiation to his Prostate. He denies any pain at this time. He does admit to pain to his right side and lower back at times, but does not take anything for this pain. He does report an episode of diarrhea on 3/20, but denies any since that time. He never took immodium. He does report some increased fatigue since starting radiation. He does have Hemodialysis Tues, Thurs, Saturday. He reports he will only urinate occasionally because of his kidney failure and Hemodialysis.   BP 96/52 mmHg  Pulse 87  Temp(Src) 98.2 F (36.8 C)  Ht 6\' 1"  (1.854 m)  Wt 224 lb 9.6 oz (101.878 kg)  BMI 29.64 kg/m2  SpO2 100%   Wt Readings from Last 3 Encounters:  12/23/15 224 lb 9.6 oz (101.878 kg)  12/21/15 224 lb (101.606 kg)  12/20/15 235 lb 1.6 oz (106.641 kg)

## 2015-12-23 NOTE — Progress Notes (Signed)
Pre visit review using our clinic review tool, if applicable. No additional management support is needed unless otherwise documented below in the visit note. 

## 2015-12-23 NOTE — Progress Notes (Signed)
  Radiation Oncology         [phone]   Name: [name] MRN: [id number]   Date: 12/23/2015  DOB: 1950/12/13   Weekly Radiation Therapy Management    ICD-9-CM ICD-10-CM   1. Malignant neoplasm of prostate ([name]) 185 C61     Current Dose: 17.55 Gy  Planned Dose:  78 Gy  Narrative The patient presents for routine under treatment assessment. Denies new issues today.  [name] presents for his 9th fraction of radiation to his Prostate. He denies any pain at this time. He does admit to pain to his right side and lower back at times, but does not take anything for this pain. He does report an episode of diarrhea on 3/20, but denies any since that time. He never took immodium. He does report some increased fatigue since starting radiation. He does have Hemodialysis Tues, Thurs, Saturday. He reports he will only urinate occasionally because of his kidney failure and Hemodialysis. He is eating and drinking well.   The patient is without complaint. Set-up films were reviewed. The chart was checked.  Physical Findings  height is 6\' 1"  (1.854 m) and weight is 224 lb 9.6 oz (101.878 kg). His temperature is 98.2 F (36.8 C). His blood pressure is 96/52 and his pulse is 87. His oxygen saturation is 100%. . NAD.  Impression The patient is tolerating radiation.  Plan Continue treatment as planned.     -----------------------------------  [name] Gibson, MD    This document serves as a record of services personally performed by [name] Gibson, MD. It was created on her behalf by [name], a trained medical scribe. The creation of this record is based on the scribe's personal observations and the provider's statements to them. This document has been checked and approved by the attending provider.

## 2015-12-23 NOTE — Assessment & Plan Note (Signed)
stable overall by history and exam, recent data reviewed with pt, and pt to continue medical treatment as before,  to f/u any worsening symptoms or concerns SpO2 Readings from Last 3 Encounters:  12/23/15 90%  12/23/15 100%  12/21/15 95%

## 2015-12-24 DIAGNOSIS — E119 Type 2 diabetes mellitus without complications: Secondary | ICD-10-CM | POA: Diagnosis not present

## 2015-12-24 DIAGNOSIS — D509 Iron deficiency anemia, unspecified: Secondary | ICD-10-CM | POA: Diagnosis not present

## 2015-12-24 DIAGNOSIS — D631 Anemia in chronic kidney disease: Secondary | ICD-10-CM | POA: Diagnosis not present

## 2015-12-24 DIAGNOSIS — N2581 Secondary hyperparathyroidism of renal origin: Secondary | ICD-10-CM | POA: Diagnosis not present

## 2015-12-24 DIAGNOSIS — N186 End stage renal disease: Secondary | ICD-10-CM | POA: Diagnosis not present

## 2015-12-26 ENCOUNTER — Encounter: Payer: Self-pay | Admitting: Radiation Oncology

## 2015-12-26 ENCOUNTER — Ambulatory Visit
Admission: RE | Admit: 2015-12-26 | Discharge: 2015-12-26 | Disposition: A | Discharge status: Home / Self Care | Payer: Medicare Other | Source: Ambulatory Visit | Attending: Radiation Oncology | Admitting: Radiation Oncology

## 2015-12-26 DIAGNOSIS — Z510 Encounter for antineoplastic radiation therapy: Secondary | ICD-10-CM | POA: Diagnosis not present

## 2015-12-26 DIAGNOSIS — E119 Type 2 diabetes mellitus without complications: Secondary | ICD-10-CM | POA: Diagnosis not present

## 2015-12-26 DIAGNOSIS — B351 Tinea unguium: Secondary | ICD-10-CM | POA: Diagnosis not present

## 2015-12-26 DIAGNOSIS — E785 Hyperlipidemia, unspecified: Secondary | ICD-10-CM | POA: Diagnosis not present

## 2015-12-26 DIAGNOSIS — C61 Malignant neoplasm of prostate: Secondary | ICD-10-CM | POA: Diagnosis not present

## 2015-12-26 DIAGNOSIS — E0590 Thyrotoxicosis, unspecified without thyrotoxic crisis or storm: Secondary | ICD-10-CM | POA: Diagnosis not present

## 2015-12-27 ENCOUNTER — Ambulatory Visit: Payer: Medicare Other

## 2015-12-27 ENCOUNTER — Ambulatory Visit
Admission: RE | Admit: 2015-12-27 | Discharge: 2015-12-27 | Disposition: A | Discharge status: Home / Self Care | Payer: Medicare Other | Source: Ambulatory Visit | Attending: Radiation Oncology | Admitting: Radiation Oncology

## 2015-12-27 DIAGNOSIS — D509 Iron deficiency anemia, unspecified: Secondary | ICD-10-CM | POA: Diagnosis not present

## 2015-12-27 DIAGNOSIS — D631 Anemia in chronic kidney disease: Secondary | ICD-10-CM | POA: Diagnosis not present

## 2015-12-27 DIAGNOSIS — N2581 Secondary hyperparathyroidism of renal origin: Secondary | ICD-10-CM | POA: Diagnosis not present

## 2015-12-27 DIAGNOSIS — N186 End stage renal disease: Secondary | ICD-10-CM | POA: Diagnosis not present

## 2015-12-27 DIAGNOSIS — E119 Type 2 diabetes mellitus without complications: Secondary | ICD-10-CM | POA: Diagnosis not present

## 2015-12-28 ENCOUNTER — Ambulatory Visit
Admission: RE | Admit: 2015-12-28 | Discharge: 2015-12-28 | Disposition: A | Discharge status: Home / Self Care | Payer: Medicare Other | Source: Ambulatory Visit | Attending: Radiation Oncology | Admitting: Radiation Oncology

## 2015-12-28 DIAGNOSIS — Z510 Encounter for antineoplastic radiation therapy: Secondary | ICD-10-CM | POA: Diagnosis not present

## 2015-12-28 DIAGNOSIS — E785 Hyperlipidemia, unspecified: Secondary | ICD-10-CM | POA: Diagnosis not present

## 2015-12-28 DIAGNOSIS — E0590 Thyrotoxicosis, unspecified without thyrotoxic crisis or storm: Secondary | ICD-10-CM | POA: Diagnosis not present

## 2015-12-28 DIAGNOSIS — E119 Type 2 diabetes mellitus without complications: Secondary | ICD-10-CM | POA: Diagnosis not present

## 2015-12-28 DIAGNOSIS — C61 Malignant neoplasm of prostate: Secondary | ICD-10-CM | POA: Diagnosis not present

## 2015-12-28 DIAGNOSIS — B351 Tinea unguium: Secondary | ICD-10-CM | POA: Diagnosis not present

## 2015-12-29 ENCOUNTER — Ambulatory Visit
Admission: RE | Admit: 2015-12-29 | Discharge: 2015-12-29 | Disposition: A | Discharge status: Home / Self Care | Payer: Medicare Other | Source: Ambulatory Visit | Attending: Radiation Oncology | Admitting: Radiation Oncology

## 2015-12-29 ENCOUNTER — Encounter: Payer: Self-pay | Admitting: Radiation Oncology

## 2015-12-29 VITALS — BP 134/68 | HR 86 | Resp 16 | Wt 231.1 lb

## 2015-12-29 DIAGNOSIS — Z510 Encounter for antineoplastic radiation therapy: Secondary | ICD-10-CM | POA: Diagnosis not present

## 2015-12-29 DIAGNOSIS — E785 Hyperlipidemia, unspecified: Secondary | ICD-10-CM | POA: Diagnosis not present

## 2015-12-29 DIAGNOSIS — B351 Tinea unguium: Secondary | ICD-10-CM | POA: Diagnosis not present

## 2015-12-29 DIAGNOSIS — D509 Iron deficiency anemia, unspecified: Secondary | ICD-10-CM | POA: Diagnosis not present

## 2015-12-29 DIAGNOSIS — N186 End stage renal disease: Secondary | ICD-10-CM | POA: Diagnosis not present

## 2015-12-29 DIAGNOSIS — N2581 Secondary hyperparathyroidism of renal origin: Secondary | ICD-10-CM | POA: Diagnosis not present

## 2015-12-29 DIAGNOSIS — C61 Malignant neoplasm of prostate: Secondary | ICD-10-CM | POA: Diagnosis not present

## 2015-12-29 DIAGNOSIS — E0590 Thyrotoxicosis, unspecified without thyrotoxic crisis or storm: Secondary | ICD-10-CM | POA: Diagnosis not present

## 2015-12-29 DIAGNOSIS — D631 Anemia in chronic kidney disease: Secondary | ICD-10-CM | POA: Diagnosis not present

## 2015-12-29 DIAGNOSIS — E119 Type 2 diabetes mellitus without complications: Secondary | ICD-10-CM | POA: Diagnosis not present

## 2015-12-29 NOTE — Progress Notes (Signed)
  Radiation Oncology         [phone]   Name: [name] MRN: [id number]   Date: 12/29/2015  DOB: November 20, 1950     Weekly Radiation Therapy Management    ICD-9-CM ICD-10-CM   1. Malignant neoplasm of prostate ([name]) 185 C61     Current Dose: 23.4 Gy  Planned Dose:  78 Gy  Narrative The patient presents for routine under treatment assessment.  Weight and vitals stable. Denies pain. Reports mild fatigue. Reports he urinates only occasionally due to kidney failure and dialysis. Denies dysuria or hematuria. Denies nocturia. Reports diarrhea has resolved.  He has Imodium and uses it as needed. He mentions some mild fatigue after treatment. This resolves as the day goes on.  The patient is without complaint. Set-up films were reviewed. The chart was checked.  Physical Findings  weight is 231 lb 1.6 oz (104.826 kg). His blood pressure is 134/68 and his pulse is 86. His respiration is 16 and oxygen saturation is 100%. . Weight essentially stable.  No significant changes.  Impression The patient is tolerating radiation.  Plan Continue treatment as planned.         Sheral Apley Tammi Klippel, M.D.    This document serves as a record of services personally performed by Tyler Pita, MD. It was created on his behalf by Lendon Collar, a trained medical scribe. The creation of this record is based on the scribe's personal observations and the provider's statements to them. This document has been checked and approved by the attending provider.

## 2015-12-29 NOTE — Progress Notes (Addendum)
Weight and vitals stable. Denies pain. Reports mild fatigue. Reports he urinates only occasionally due to kidney failure and dialysis. Denies dysuria or hematuria. Denies nocturia.  Reports diarrhea has resolved.   BP 134/68 mmHg  Pulse 86  Resp 16  Wt 231 lb 1.6 oz (104.826 kg)  SpO2 100% Wt Readings from Last 3 Encounters:  12/29/15 231 lb 1.6 oz (104.826 kg)  12/23/15 224 lb (101.606 kg)  12/23/15 224 lb 9.6 oz (101.878 kg)

## 2015-12-30 ENCOUNTER — Ambulatory Visit
Admission: RE | Admit: 2015-12-30 | Discharge: 2015-12-30 | Disposition: A | Discharge status: Home / Self Care | Payer: Medicare Other | Source: Ambulatory Visit | Attending: Radiation Oncology | Admitting: Radiation Oncology

## 2015-12-30 DIAGNOSIS — B351 Tinea unguium: Secondary | ICD-10-CM | POA: Diagnosis not present

## 2015-12-30 DIAGNOSIS — E1129 Type 2 diabetes mellitus with other diabetic kidney complication: Secondary | ICD-10-CM | POA: Diagnosis not present

## 2015-12-30 DIAGNOSIS — E0590 Thyrotoxicosis, unspecified without thyrotoxic crisis or storm: Secondary | ICD-10-CM | POA: Diagnosis not present

## 2015-12-30 DIAGNOSIS — E785 Hyperlipidemia, unspecified: Secondary | ICD-10-CM | POA: Diagnosis not present

## 2015-12-30 DIAGNOSIS — E119 Type 2 diabetes mellitus without complications: Secondary | ICD-10-CM | POA: Diagnosis not present

## 2015-12-30 DIAGNOSIS — Z992 Dependence on renal dialysis: Secondary | ICD-10-CM | POA: Diagnosis not present

## 2015-12-30 DIAGNOSIS — Z510 Encounter for antineoplastic radiation therapy: Secondary | ICD-10-CM | POA: Diagnosis not present

## 2015-12-30 DIAGNOSIS — C61 Malignant neoplasm of prostate: Secondary | ICD-10-CM | POA: Diagnosis not present

## 2015-12-30 DIAGNOSIS — N186 End stage renal disease: Secondary | ICD-10-CM | POA: Diagnosis not present

## 2015-12-31 DIAGNOSIS — D631 Anemia in chronic kidney disease: Secondary | ICD-10-CM | POA: Diagnosis not present

## 2015-12-31 DIAGNOSIS — E119 Type 2 diabetes mellitus without complications: Secondary | ICD-10-CM | POA: Diagnosis not present

## 2015-12-31 DIAGNOSIS — N186 End stage renal disease: Secondary | ICD-10-CM | POA: Diagnosis not present

## 2015-12-31 DIAGNOSIS — N2581 Secondary hyperparathyroidism of renal origin: Secondary | ICD-10-CM | POA: Diagnosis not present

## 2016-01-02 ENCOUNTER — Ambulatory Visit
Admission: RE | Admit: 2016-01-02 | Discharge: 2016-01-02 | Disposition: A | Discharge status: Home / Self Care | Payer: Medicare Other | Source: Ambulatory Visit | Attending: Radiation Oncology | Admitting: Radiation Oncology

## 2016-01-02 DIAGNOSIS — E785 Hyperlipidemia, unspecified: Secondary | ICD-10-CM | POA: Diagnosis not present

## 2016-01-02 DIAGNOSIS — B351 Tinea unguium: Secondary | ICD-10-CM | POA: Diagnosis not present

## 2016-01-02 DIAGNOSIS — E0590 Thyrotoxicosis, unspecified without thyrotoxic crisis or storm: Secondary | ICD-10-CM | POA: Diagnosis not present

## 2016-01-02 DIAGNOSIS — C61 Malignant neoplasm of prostate: Secondary | ICD-10-CM | POA: Diagnosis not present

## 2016-01-02 DIAGNOSIS — E119 Type 2 diabetes mellitus without complications: Secondary | ICD-10-CM | POA: Diagnosis not present

## 2016-01-02 DIAGNOSIS — Z510 Encounter for antineoplastic radiation therapy: Secondary | ICD-10-CM | POA: Diagnosis not present

## 2016-01-03 ENCOUNTER — Ambulatory Visit
Admission: RE | Admit: 2016-01-03 | Discharge: 2016-01-03 | Disposition: A | Discharge status: Home / Self Care | Payer: Medicare Other | Source: Ambulatory Visit | Attending: Radiation Oncology | Admitting: Radiation Oncology

## 2016-01-03 DIAGNOSIS — N2581 Secondary hyperparathyroidism of renal origin: Secondary | ICD-10-CM | POA: Diagnosis not present

## 2016-01-03 DIAGNOSIS — C61 Malignant neoplasm of prostate: Secondary | ICD-10-CM | POA: Diagnosis not present

## 2016-01-03 DIAGNOSIS — E785 Hyperlipidemia, unspecified: Secondary | ICD-10-CM | POA: Diagnosis not present

## 2016-01-03 DIAGNOSIS — Z510 Encounter for antineoplastic radiation therapy: Secondary | ICD-10-CM | POA: Diagnosis not present

## 2016-01-03 DIAGNOSIS — E0590 Thyrotoxicosis, unspecified without thyrotoxic crisis or storm: Secondary | ICD-10-CM | POA: Diagnosis not present

## 2016-01-03 DIAGNOSIS — D631 Anemia in chronic kidney disease: Secondary | ICD-10-CM | POA: Diagnosis not present

## 2016-01-03 DIAGNOSIS — N186 End stage renal disease: Secondary | ICD-10-CM | POA: Diagnosis not present

## 2016-01-03 DIAGNOSIS — E119 Type 2 diabetes mellitus without complications: Secondary | ICD-10-CM | POA: Diagnosis not present

## 2016-01-03 DIAGNOSIS — B351 Tinea unguium: Secondary | ICD-10-CM | POA: Diagnosis not present

## 2016-01-04 ENCOUNTER — Ambulatory Visit
Admission: RE | Admit: 2016-01-04 | Discharge: 2016-01-04 | Disposition: A | Discharge status: Home / Self Care | Payer: Medicare Other | Source: Ambulatory Visit | Attending: Radiation Oncology | Admitting: Radiation Oncology

## 2016-01-04 DIAGNOSIS — E785 Hyperlipidemia, unspecified: Secondary | ICD-10-CM | POA: Diagnosis not present

## 2016-01-04 DIAGNOSIS — E0590 Thyrotoxicosis, unspecified without thyrotoxic crisis or storm: Secondary | ICD-10-CM | POA: Diagnosis not present

## 2016-01-04 DIAGNOSIS — Z510 Encounter for antineoplastic radiation therapy: Secondary | ICD-10-CM | POA: Diagnosis not present

## 2016-01-04 DIAGNOSIS — E119 Type 2 diabetes mellitus without complications: Secondary | ICD-10-CM | POA: Diagnosis not present

## 2016-01-04 DIAGNOSIS — B351 Tinea unguium: Secondary | ICD-10-CM | POA: Diagnosis not present

## 2016-01-04 DIAGNOSIS — C61 Malignant neoplasm of prostate: Secondary | ICD-10-CM | POA: Diagnosis not present

## 2016-01-05 ENCOUNTER — Ambulatory Visit
Admission: RE | Admit: 2016-01-05 | Discharge: 2016-01-05 | Disposition: A | Discharge status: Home / Self Care | Payer: Medicare Other | Source: Ambulatory Visit | Attending: Radiation Oncology | Admitting: Radiation Oncology

## 2016-01-05 DIAGNOSIS — Z510 Encounter for antineoplastic radiation therapy: Secondary | ICD-10-CM | POA: Diagnosis not present

## 2016-01-05 DIAGNOSIS — N186 End stage renal disease: Secondary | ICD-10-CM | POA: Diagnosis not present

## 2016-01-05 DIAGNOSIS — C61 Malignant neoplasm of prostate: Secondary | ICD-10-CM | POA: Diagnosis not present

## 2016-01-05 DIAGNOSIS — E119 Type 2 diabetes mellitus without complications: Secondary | ICD-10-CM | POA: Diagnosis not present

## 2016-01-05 DIAGNOSIS — N2581 Secondary hyperparathyroidism of renal origin: Secondary | ICD-10-CM | POA: Diagnosis not present

## 2016-01-05 DIAGNOSIS — E785 Hyperlipidemia, unspecified: Secondary | ICD-10-CM | POA: Diagnosis not present

## 2016-01-05 DIAGNOSIS — E0590 Thyrotoxicosis, unspecified without thyrotoxic crisis or storm: Secondary | ICD-10-CM | POA: Diagnosis not present

## 2016-01-05 DIAGNOSIS — B351 Tinea unguium: Secondary | ICD-10-CM | POA: Diagnosis not present

## 2016-01-05 DIAGNOSIS — D631 Anemia in chronic kidney disease: Secondary | ICD-10-CM | POA: Diagnosis not present

## 2016-01-06 ENCOUNTER — Ambulatory Visit
Admission: RE | Admit: 2016-01-06 | Discharge: 2016-01-06 | Disposition: A | Discharge status: Home / Self Care | Payer: Medicare Other | Source: Ambulatory Visit | Attending: Radiation Oncology | Admitting: Radiation Oncology

## 2016-01-06 ENCOUNTER — Encounter: Payer: Self-pay | Admitting: Radiation Oncology

## 2016-01-06 VITALS — BP 113/67 | HR 91 | Resp 16 | Wt 225.2 lb

## 2016-01-06 DIAGNOSIS — C61 Malignant neoplasm of prostate: Secondary | ICD-10-CM | POA: Diagnosis not present

## 2016-01-06 DIAGNOSIS — E119 Type 2 diabetes mellitus without complications: Secondary | ICD-10-CM | POA: Diagnosis not present

## 2016-01-06 DIAGNOSIS — B351 Tinea unguium: Secondary | ICD-10-CM | POA: Diagnosis not present

## 2016-01-06 DIAGNOSIS — E0590 Thyrotoxicosis, unspecified without thyrotoxic crisis or storm: Secondary | ICD-10-CM | POA: Diagnosis not present

## 2016-01-06 DIAGNOSIS — E785 Hyperlipidemia, unspecified: Secondary | ICD-10-CM | POA: Diagnosis not present

## 2016-01-06 DIAGNOSIS — Z510 Encounter for antineoplastic radiation therapy: Secondary | ICD-10-CM | POA: Diagnosis not present

## 2016-01-06 NOTE — Progress Notes (Signed)
Weight and vitals stable. Denies pain. Report fatigue. Reports he urinates only occasionally due to kidney failure and dialysis. Reports new onset dysuria. Denies hematuria. Denies nocturia. Reports on an average he has two bowel movement per day. Reports he is concerned he isn't having his normal two bowel movements per day.   BP 113/67 mmHg  Pulse 91  Resp 16  Wt 225 lb 3.2 oz (102.15 kg)  SpO2 100% Wt Readings from Last 3 Encounters:  01/06/16 225 lb 3.2 oz (102.15 kg)  12/29/15 231 lb 1.6 oz (104.826 kg)  12/23/15 224 lb (101.606 kg)

## 2016-01-06 NOTE — Progress Notes (Signed)
  Radiation Oncology         [phone]   Name: [name] MRN: [id number]   Date: 01/06/2016  DOB: 07-24-1951     Weekly Radiation Therapy Management  No diagnosis found.  Current Dose: 35.1 Gy  Planned Dose:  78 Gy  Narrative The patient presents for routine under treatment assessment.  18/40 radiation treatments. Weight and vitals stable. Denies pain. Reports fatigue. Reports he urinates only occasionally due to kidney failure and dialysis. Reports new onset dysuria. Denies hematuria. Denies nocturia. Reports on an average he has two bowel movement per day. Reports he is concerned he isn't having his normal two bowel movements per day. He would like to be able to wear his knee brace while receiving radiation, this was approved.   The patient is without complaint. Set-up films were reviewed. The chart was checked.  Physical Findings  weight is 225 lb 3.2 oz (102.15 kg). His blood pressure is 113/67 and his pulse is 91. His respiration is 16 and oxygen saturation is 100%. . Weight essentially stable.  No significant changes.  Impression The patient is tolerating radiation.  Plan Continue treatment as planned. I will consult with the patient's nephrologist to discuss potential medication to resolve new onset dysuria and frequency.         Sheral Apley Tammi Klippel, M.D.    This document serves as a record of services personally performed by Tyler Pita, MD. It was created on his behalf by Lendon Collar, a trained medical scribe. The creation of this record is based on the scribe's personal observations and the provider's statements to them. This document has been checked and approved by the attending provider.

## 2016-01-07 DIAGNOSIS — D631 Anemia in chronic kidney disease: Secondary | ICD-10-CM | POA: Diagnosis not present

## 2016-01-07 DIAGNOSIS — N2581 Secondary hyperparathyroidism of renal origin: Secondary | ICD-10-CM | POA: Diagnosis not present

## 2016-01-07 DIAGNOSIS — N186 End stage renal disease: Secondary | ICD-10-CM | POA: Diagnosis not present

## 2016-01-07 DIAGNOSIS — E119 Type 2 diabetes mellitus without complications: Secondary | ICD-10-CM | POA: Diagnosis not present

## 2016-01-09 ENCOUNTER — Ambulatory Visit
Admission: RE | Admit: 2016-01-09 | Discharge: 2016-01-09 | Disposition: A | Discharge status: Home / Self Care | Payer: Medicare Other | Source: Ambulatory Visit | Attending: Radiation Oncology | Admitting: Radiation Oncology

## 2016-01-09 DIAGNOSIS — E0590 Thyrotoxicosis, unspecified without thyrotoxic crisis or storm: Secondary | ICD-10-CM | POA: Diagnosis not present

## 2016-01-09 DIAGNOSIS — E119 Type 2 diabetes mellitus without complications: Secondary | ICD-10-CM | POA: Diagnosis not present

## 2016-01-09 DIAGNOSIS — E785 Hyperlipidemia, unspecified: Secondary | ICD-10-CM | POA: Diagnosis not present

## 2016-01-09 DIAGNOSIS — C61 Malignant neoplasm of prostate: Secondary | ICD-10-CM | POA: Diagnosis not present

## 2016-01-09 DIAGNOSIS — B351 Tinea unguium: Secondary | ICD-10-CM | POA: Diagnosis not present

## 2016-01-09 DIAGNOSIS — Z510 Encounter for antineoplastic radiation therapy: Secondary | ICD-10-CM | POA: Diagnosis not present

## 2016-01-10 ENCOUNTER — Ambulatory Visit
Admission: RE | Admit: 2016-01-10 | Discharge: 2016-01-10 | Disposition: A | Discharge status: Home / Self Care | Payer: Medicare Other | Source: Ambulatory Visit | Attending: Radiation Oncology | Admitting: Radiation Oncology

## 2016-01-10 DIAGNOSIS — N186 End stage renal disease: Secondary | ICD-10-CM | POA: Diagnosis not present

## 2016-01-10 DIAGNOSIS — C61 Malignant neoplasm of prostate: Secondary | ICD-10-CM | POA: Diagnosis not present

## 2016-01-10 DIAGNOSIS — E785 Hyperlipidemia, unspecified: Secondary | ICD-10-CM | POA: Diagnosis not present

## 2016-01-10 DIAGNOSIS — E0590 Thyrotoxicosis, unspecified without thyrotoxic crisis or storm: Secondary | ICD-10-CM | POA: Diagnosis not present

## 2016-01-10 DIAGNOSIS — B351 Tinea unguium: Secondary | ICD-10-CM | POA: Diagnosis not present

## 2016-01-10 DIAGNOSIS — E119 Type 2 diabetes mellitus without complications: Secondary | ICD-10-CM | POA: Diagnosis not present

## 2016-01-10 DIAGNOSIS — D631 Anemia in chronic kidney disease: Secondary | ICD-10-CM | POA: Diagnosis not present

## 2016-01-10 DIAGNOSIS — N2581 Secondary hyperparathyroidism of renal origin: Secondary | ICD-10-CM | POA: Diagnosis not present

## 2016-01-10 DIAGNOSIS — Z510 Encounter for antineoplastic radiation therapy: Secondary | ICD-10-CM | POA: Diagnosis not present

## 2016-01-11 ENCOUNTER — Ambulatory Visit
Admission: RE | Admit: 2016-01-11 | Discharge: 2016-01-11 | Disposition: A | Discharge status: Home / Self Care | Payer: Medicare Other | Source: Ambulatory Visit | Attending: Radiation Oncology | Admitting: Radiation Oncology

## 2016-01-11 ENCOUNTER — Other Ambulatory Visit: Payer: Self-pay | Admitting: Internal Medicine

## 2016-01-11 DIAGNOSIS — E119 Type 2 diabetes mellitus without complications: Secondary | ICD-10-CM | POA: Diagnosis not present

## 2016-01-11 DIAGNOSIS — B351 Tinea unguium: Secondary | ICD-10-CM | POA: Diagnosis not present

## 2016-01-11 DIAGNOSIS — E785 Hyperlipidemia, unspecified: Secondary | ICD-10-CM | POA: Diagnosis not present

## 2016-01-11 DIAGNOSIS — Z510 Encounter for antineoplastic radiation therapy: Secondary | ICD-10-CM | POA: Diagnosis not present

## 2016-01-11 DIAGNOSIS — E0590 Thyrotoxicosis, unspecified without thyrotoxic crisis or storm: Secondary | ICD-10-CM | POA: Diagnosis not present

## 2016-01-11 DIAGNOSIS — M1712 Unilateral primary osteoarthritis, left knee: Secondary | ICD-10-CM | POA: Diagnosis not present

## 2016-01-11 DIAGNOSIS — C61 Malignant neoplasm of prostate: Secondary | ICD-10-CM | POA: Diagnosis not present

## 2016-01-11 MED ORDER — CARVEDILOL 12.5 MG PO TABS: 12.5000 mg | ORAL_TABLET | ORAL | Status: DC

## 2016-01-11 NOTE — Telephone Encounter (Signed)
Refill sent to [facility].../lmb 

## 2016-01-11 NOTE — Telephone Encounter (Signed)
[name] (spouse) called in to request a refill for spouse on medication carvedilol    Pharmacy: [facility] ([facility] [place]: [phone]

## 2016-01-12 ENCOUNTER — Ambulatory Visit
Admission: RE | Admit: 2016-01-12 | Discharge: 2016-01-12 | Disposition: A | Discharge status: Home / Self Care | Payer: Medicare Other | Source: Ambulatory Visit | Attending: Radiation Oncology | Admitting: Radiation Oncology

## 2016-01-12 DIAGNOSIS — E119 Type 2 diabetes mellitus without complications: Secondary | ICD-10-CM | POA: Diagnosis not present

## 2016-01-12 DIAGNOSIS — E785 Hyperlipidemia, unspecified: Secondary | ICD-10-CM | POA: Diagnosis not present

## 2016-01-12 DIAGNOSIS — D631 Anemia in chronic kidney disease: Secondary | ICD-10-CM | POA: Diagnosis not present

## 2016-01-12 DIAGNOSIS — N2581 Secondary hyperparathyroidism of renal origin: Secondary | ICD-10-CM | POA: Diagnosis not present

## 2016-01-12 DIAGNOSIS — E0590 Thyrotoxicosis, unspecified without thyrotoxic crisis or storm: Secondary | ICD-10-CM | POA: Diagnosis not present

## 2016-01-12 DIAGNOSIS — B351 Tinea unguium: Secondary | ICD-10-CM | POA: Diagnosis not present

## 2016-01-12 DIAGNOSIS — N186 End stage renal disease: Secondary | ICD-10-CM | POA: Diagnosis not present

## 2016-01-12 DIAGNOSIS — Z510 Encounter for antineoplastic radiation therapy: Secondary | ICD-10-CM | POA: Diagnosis not present

## 2016-01-12 DIAGNOSIS — C61 Malignant neoplasm of prostate: Secondary | ICD-10-CM | POA: Diagnosis not present

## 2016-01-13 ENCOUNTER — Ambulatory Visit
Admission: RE | Admit: 2016-01-13 | Discharge: 2016-01-13 | Disposition: A | Discharge status: Home / Self Care | Payer: Medicare Other | Source: Ambulatory Visit | Attending: Radiation Oncology | Admitting: Radiation Oncology

## 2016-01-13 VITALS — BP 112/61 | HR 78 | Resp 16 | Wt 225.0 lb

## 2016-01-13 DIAGNOSIS — E785 Hyperlipidemia, unspecified: Secondary | ICD-10-CM | POA: Diagnosis not present

## 2016-01-13 DIAGNOSIS — E0590 Thyrotoxicosis, unspecified without thyrotoxic crisis or storm: Secondary | ICD-10-CM | POA: Diagnosis not present

## 2016-01-13 DIAGNOSIS — Z510 Encounter for antineoplastic radiation therapy: Secondary | ICD-10-CM | POA: Diagnosis not present

## 2016-01-13 DIAGNOSIS — E119 Type 2 diabetes mellitus without complications: Secondary | ICD-10-CM | POA: Diagnosis not present

## 2016-01-13 DIAGNOSIS — C61 Malignant neoplasm of prostate: Secondary | ICD-10-CM | POA: Diagnosis not present

## 2016-01-13 DIAGNOSIS — B351 Tinea unguium: Secondary | ICD-10-CM | POA: Diagnosis not present

## 2016-01-13 NOTE — Progress Notes (Signed)
Department of Radiation Oncology  Phone:  [phone] Fax:        [phone]  Weekly Treatment Note    Name: [name] Date: 01/13/2016 MRN: [id number] DOB: 1951/09/15   Diagnosis:     ICD-9-CM ICD-10-CM   1. Malignant neoplasm of prostate ([place]) 185 C61      Current dose: 44.85 Gy  Current fraction: 23   MEDICATIONS: Current Outpatient Prescriptions  Medication Sig Dispense Refill  . acetaminophen (TYLENOL) 325 MG tablet Take 1 tablet (325 mg total) by mouth every 4 (four) hours as needed for headache or mild pain.    [name] allopurinol (ZYLOPRIM) 100 MG tablet Take 1 tablet (100 mg total) by mouth daily. (Patient taking differently: Take 100 mg by mouth daily as needed (gout). ) 30 tablet 11  . amLODipine (NORVASC) 10 MG tablet     . carvedilol (COREG) 12.5 MG tablet Take 1 tablet (12.5 mg total) by mouth See admin instructions. Take 1 tablet (12.5 mg) by mouth on Sunday, Monday, Wednesday and Friday morning, take 1 tablet (12.5 mg) daily at 9pm 30 tablet 5  . cetirizine (ZYRTEC) 10 MG tablet Take 1 tablet (10 mg total) by mouth daily. 30 tablet 11  . cinacalcet (SENSIPAR) 60 MG tablet Take 60 mg by mouth 2 (two) times daily.     . citalopram (CELEXA) 10 MG tablet Take 1 tablet (10 mg total) by mouth daily. 90 tablet 3  . colesevelam (WELCHOL) 625 MG tablet Take 3 tablets (1,875 mg total) by mouth 2 (two) times daily with a meal. 180 tablet 11  . gabapentin (NEURONTIN) 300 MG capsule 1-2 tabs by mouth at bedtime for sleep and pain 180 capsule 3  . lanthanum (FOSRENOL) 1000 MG chewable tablet Chew 1,000 mg by mouth 2 (two) times daily with a meal. Reported on 12/23/2015    . multivitamin (RENA-VIT) TABS tablet Take 1 tablet by mouth daily. Reported on 10/24/2015    . triamcinolone (NASACORT AQ) 55 MCG/ACT AERO nasal inhaler Place 2 sprays into the nose daily. 1 Inhaler 12  . ZETIA 10 MG tablet TAKE ONE TABLET BY MOUTH ONCE DAILY 30 tablet 0   No current  facility-administered medications for this encounter.     ALLERGIES: Cephalexin; Statins; and Ciprofloxacin   LABORATORY DATA:  Lab Results  Component Value Date   WBC 6.1 10/31/2015   HGB 11.6* 10/31/2015   HCT 37.8* 10/31/2015   MCV 87.5 10/31/2015   PLT 325.0 10/31/2015   Lab Results  Component Value Date   NA 142 09/10/2015   K 3.6 09/10/2015   CL 101 09/10/2015   CO2 31 09/10/2015   Lab Results  Component Value Date   ALT 14 10/31/2015   AST 16 10/31/2015   ALKPHOS 73 10/31/2015   BILITOT 0.8 10/31/2015     NARRATIVE: [name] was seen today for weekly treatment management. The chart was checked and the patient's films were reviewed.  Weight and vitals stable. Denies pain. Report fatigue. Reports he urinates only occasionally due to kidney failure and dialysis. Reports new onset dysuria. Denies hematuria. Denies nocturia. Reports on an average he has two bowel movement per day. Reports he is concerned he isn't having his normal two bowel movements per day.   BP 112/61 mmHg  Pulse 78  Resp 16  Wt 225 lb (102.059 kg)  SpO2 100% Wt Readings from Last 3 Encounters:  01/13/16 225 lb (102.059 kg)  01/06/16 225 lb 3.2 oz (102.15 kg)  12/29/15 231 lb 1.6 oz (104.826 kg)        PHYSICAL EXAMINATION: weight is 225 lb (102.059 kg). His blood pressure is 112/61 and his pulse is 78. His respiration is 16 and oxygen saturation is 100%.        ASSESSMENT: The patient is doing satisfactorily with treatment.  He reports no significant difficulties over the last week. He is pleased with how he is doing.  PLAN: We will continue with the patient's radiation treatment as planned.

## 2016-01-13 NOTE — Progress Notes (Signed)
Weight and vitals stable. Denies pain. Report fatigue. Reports he urinates only occasionally due to kidney failure and dialysis. Reports new onset dysuria. Denies hematuria. Denies nocturia. Reports on an average he has two bowel movement per day. Reports he is concerned he isn't having his normal two bowel movements per day.   BP 112/61 mmHg  Pulse 78  Resp 16  Wt 225 lb (102.059 kg)  SpO2 100% Wt Readings from Last 3 Encounters:  01/13/16 225 lb (102.059 kg)  01/06/16 225 lb 3.2 oz (102.15 kg)  12/29/15 231 lb 1.6 oz (104.826 kg)

## 2016-01-14 DIAGNOSIS — N2581 Secondary hyperparathyroidism of renal origin: Secondary | ICD-10-CM | POA: Diagnosis not present

## 2016-01-14 DIAGNOSIS — N186 End stage renal disease: Secondary | ICD-10-CM | POA: Diagnosis not present

## 2016-01-14 DIAGNOSIS — D631 Anemia in chronic kidney disease: Secondary | ICD-10-CM | POA: Diagnosis not present

## 2016-01-14 DIAGNOSIS — E119 Type 2 diabetes mellitus without complications: Secondary | ICD-10-CM | POA: Diagnosis not present

## 2016-01-16 ENCOUNTER — Ambulatory Visit
Admission: RE | Admit: 2016-01-16 | Discharge: 2016-01-16 | Disposition: A | Discharge status: Home / Self Care | Payer: Medicare Other | Source: Ambulatory Visit | Attending: Radiation Oncology | Admitting: Radiation Oncology

## 2016-01-16 ENCOUNTER — Telehealth: Payer: Self-pay

## 2016-01-16 DIAGNOSIS — E119 Type 2 diabetes mellitus without complications: Secondary | ICD-10-CM | POA: Diagnosis not present

## 2016-01-16 DIAGNOSIS — Z510 Encounter for antineoplastic radiation therapy: Secondary | ICD-10-CM | POA: Diagnosis not present

## 2016-01-16 DIAGNOSIS — B351 Tinea unguium: Secondary | ICD-10-CM | POA: Diagnosis not present

## 2016-01-16 DIAGNOSIS — E0590 Thyrotoxicosis, unspecified without thyrotoxic crisis or storm: Secondary | ICD-10-CM | POA: Diagnosis not present

## 2016-01-16 DIAGNOSIS — C61 Malignant neoplasm of prostate: Secondary | ICD-10-CM | POA: Diagnosis not present

## 2016-01-16 DIAGNOSIS — E785 Hyperlipidemia, unspecified: Secondary | ICD-10-CM | POA: Diagnosis not present

## 2016-01-16 NOTE — Telephone Encounter (Signed)
Patients wife called in about medication carvelidol. States that the pharmacy will not fill medication with the script that way. Please advise on how patient needs to take the medication on the new script so that the pharmacy will refill it

## 2016-01-17 ENCOUNTER — Ambulatory Visit
Admission: RE | Admit: 2016-01-17 | Discharge: 2016-01-17 | Disposition: A | Discharge status: Home / Self Care | Payer: Medicare Other | Source: Ambulatory Visit | Attending: Radiation Oncology | Admitting: Radiation Oncology

## 2016-01-17 DIAGNOSIS — Z510 Encounter for antineoplastic radiation therapy: Secondary | ICD-10-CM | POA: Diagnosis not present

## 2016-01-17 DIAGNOSIS — C61 Malignant neoplasm of prostate: Secondary | ICD-10-CM | POA: Diagnosis not present

## 2016-01-17 DIAGNOSIS — E119 Type 2 diabetes mellitus without complications: Secondary | ICD-10-CM | POA: Diagnosis not present

## 2016-01-17 DIAGNOSIS — E0590 Thyrotoxicosis, unspecified without thyrotoxic crisis or storm: Secondary | ICD-10-CM | POA: Diagnosis not present

## 2016-01-17 DIAGNOSIS — N2581 Secondary hyperparathyroidism of renal origin: Secondary | ICD-10-CM | POA: Diagnosis not present

## 2016-01-17 DIAGNOSIS — E785 Hyperlipidemia, unspecified: Secondary | ICD-10-CM | POA: Diagnosis not present

## 2016-01-17 DIAGNOSIS — B351 Tinea unguium: Secondary | ICD-10-CM | POA: Diagnosis not present

## 2016-01-17 DIAGNOSIS — N186 End stage renal disease: Secondary | ICD-10-CM | POA: Diagnosis not present

## 2016-01-17 DIAGNOSIS — D631 Anemia in chronic kidney disease: Secondary | ICD-10-CM | POA: Diagnosis not present

## 2016-01-17 MED ORDER — CARVEDILOL 12.5 MG PO TABS: 12.5000 mg | ORAL_TABLET | Freq: Two times a day (BID) | ORAL | Status: DC

## 2016-01-17 NOTE — Telephone Encounter (Signed)
rx changed - done erx

## 2016-01-17 NOTE — Addendum Note (Signed)
Addended by: Biagio Borg on: 01/17/2016 05:49 AM   Modules accepted: Orders

## 2016-01-18 ENCOUNTER — Ambulatory Visit
Admission: RE | Admit: 2016-01-18 | Discharge: 2016-01-18 | Disposition: A | Discharge status: Home / Self Care | Payer: Medicare Other | Source: Ambulatory Visit | Attending: Radiation Oncology | Admitting: Radiation Oncology

## 2016-01-18 DIAGNOSIS — Z510 Encounter for antineoplastic radiation therapy: Secondary | ICD-10-CM | POA: Diagnosis not present

## 2016-01-18 DIAGNOSIS — B351 Tinea unguium: Secondary | ICD-10-CM | POA: Diagnosis not present

## 2016-01-18 DIAGNOSIS — C61 Malignant neoplasm of prostate: Secondary | ICD-10-CM | POA: Diagnosis not present

## 2016-01-18 DIAGNOSIS — E785 Hyperlipidemia, unspecified: Secondary | ICD-10-CM | POA: Diagnosis not present

## 2016-01-18 DIAGNOSIS — E119 Type 2 diabetes mellitus without complications: Secondary | ICD-10-CM | POA: Diagnosis not present

## 2016-01-18 DIAGNOSIS — E0590 Thyrotoxicosis, unspecified without thyrotoxic crisis or storm: Secondary | ICD-10-CM | POA: Diagnosis not present

## 2016-01-19 ENCOUNTER — Ambulatory Visit
Admission: RE | Admit: 2016-01-19 | Discharge: 2016-01-19 | Disposition: A | Discharge status: Home / Self Care | Payer: Medicare Other | Source: Ambulatory Visit | Attending: Radiation Oncology | Admitting: Radiation Oncology

## 2016-01-19 DIAGNOSIS — N186 End stage renal disease: Secondary | ICD-10-CM | POA: Diagnosis not present

## 2016-01-19 DIAGNOSIS — E119 Type 2 diabetes mellitus without complications: Secondary | ICD-10-CM | POA: Diagnosis not present

## 2016-01-19 DIAGNOSIS — B351 Tinea unguium: Secondary | ICD-10-CM | POA: Diagnosis not present

## 2016-01-19 DIAGNOSIS — E0590 Thyrotoxicosis, unspecified without thyrotoxic crisis or storm: Secondary | ICD-10-CM | POA: Diagnosis not present

## 2016-01-19 DIAGNOSIS — C61 Malignant neoplasm of prostate: Secondary | ICD-10-CM | POA: Diagnosis not present

## 2016-01-19 DIAGNOSIS — D631 Anemia in chronic kidney disease: Secondary | ICD-10-CM | POA: Diagnosis not present

## 2016-01-19 DIAGNOSIS — Z510 Encounter for antineoplastic radiation therapy: Secondary | ICD-10-CM | POA: Diagnosis not present

## 2016-01-19 DIAGNOSIS — E785 Hyperlipidemia, unspecified: Secondary | ICD-10-CM | POA: Diagnosis not present

## 2016-01-19 DIAGNOSIS — N2581 Secondary hyperparathyroidism of renal origin: Secondary | ICD-10-CM | POA: Diagnosis not present

## 2016-01-20 ENCOUNTER — Ambulatory Visit
Admission: RE | Admit: 2016-01-20 | Discharge: 2016-01-20 | Disposition: A | Discharge status: Home / Self Care | Payer: Medicare Other | Source: Ambulatory Visit | Attending: Radiation Oncology | Admitting: Radiation Oncology

## 2016-01-20 ENCOUNTER — Encounter: Payer: Self-pay | Admitting: Radiation Oncology

## 2016-01-20 VITALS — BP 112/63 | HR 81 | Resp 16 | Wt 227.2 lb

## 2016-01-20 DIAGNOSIS — Z510 Encounter for antineoplastic radiation therapy: Secondary | ICD-10-CM | POA: Diagnosis not present

## 2016-01-20 DIAGNOSIS — B351 Tinea unguium: Secondary | ICD-10-CM | POA: Diagnosis not present

## 2016-01-20 DIAGNOSIS — E119 Type 2 diabetes mellitus without complications: Secondary | ICD-10-CM | POA: Diagnosis not present

## 2016-01-20 DIAGNOSIS — C61 Malignant neoplasm of prostate: Secondary | ICD-10-CM

## 2016-01-20 DIAGNOSIS — E0590 Thyrotoxicosis, unspecified without thyrotoxic crisis or storm: Secondary | ICD-10-CM | POA: Diagnosis not present

## 2016-01-20 DIAGNOSIS — E785 Hyperlipidemia, unspecified: Secondary | ICD-10-CM | POA: Diagnosis not present

## 2016-01-20 NOTE — Progress Notes (Addendum)
Weight and vitals stable. Denies pain. Report fatigue. Reports he urinates only occasionally due to kidney failure and dialysis. Reports new onset dysuria. Denies hematuria. Denies nocturia. Denies diarrhea.  BP 112/63 mmHg  Pulse 81  Resp 16  Wt 227 lb 3.2 oz (103.057 kg)  SpO2 100% Wt Readings from Last 3 Encounters:  01/20/16 227 lb 3.2 oz (103.057 kg)  01/13/16 225 lb (102.059 kg)  01/06/16 225 lb 3.2 oz (102.15 kg)

## 2016-01-20 NOTE — Progress Notes (Signed)
  Radiation Oncology         [id number]   Name: [name] MRN: [id number]   Date: 01/20/2016  DOB: [birth year]     Weekly Radiation Therapy Management    ICD-9-CM ICD-10-CM   1. Malignant neoplasm of prostate ([name]) 185 C61     Current Dose: 54.6 Gy  Planned Dose:  78 Gy  Narrative The patient presents for routine under treatment assessment.  Weight and vitals stable. Denies pain. Report fatigue. Reports he urinates only occasionally due to kidney failure and dialysis. Reports new onset dysuria. Denies hematuria. Denies nocturia. Denies diarrhea. Reports feeling intermittent weakness after treatment.   The patient is without complaint. Set-up films were reviewed. The chart was checked.  Physical Findings  weight is 227 lb 3.2 oz (103.057 kg). His blood pressure is 112/63 and his pulse is 81. His respiration is 16 and oxygen saturation is 100%. . Weight essentially stable.  No significant changes. Ambulates with use of walker.  Impression The patient is tolerating radiation.  Plan Continue treatment as planned.          Sheral Apley Tammi Klippel, M.D.   This document serves as a record of services personally performed by Tyler Pita, MD. It was created on his behalf by Arlyce Harman, a trained medical scribe. The creation of this record is based on the scribe's personal observations and the provider's statements to them. This document has been checked and approved by the attending provider.

## 2016-01-21 DIAGNOSIS — D631 Anemia in chronic kidney disease: Secondary | ICD-10-CM | POA: Diagnosis not present

## 2016-01-21 DIAGNOSIS — N2581 Secondary hyperparathyroidism of renal origin: Secondary | ICD-10-CM | POA: Diagnosis not present

## 2016-01-21 DIAGNOSIS — E119 Type 2 diabetes mellitus without complications: Secondary | ICD-10-CM | POA: Diagnosis not present

## 2016-01-21 DIAGNOSIS — N186 End stage renal disease: Secondary | ICD-10-CM | POA: Diagnosis not present

## 2016-01-23 ENCOUNTER — Ambulatory Visit
Admission: RE | Admit: 2016-01-23 | Discharge: 2016-01-23 | Disposition: A | Discharge status: Home / Self Care | Payer: Medicare Other | Source: Ambulatory Visit | Attending: Radiation Oncology | Admitting: Radiation Oncology

## 2016-01-23 DIAGNOSIS — C61 Malignant neoplasm of prostate: Secondary | ICD-10-CM | POA: Diagnosis not present

## 2016-01-23 DIAGNOSIS — Z510 Encounter for antineoplastic radiation therapy: Secondary | ICD-10-CM | POA: Diagnosis not present

## 2016-01-24 ENCOUNTER — Ambulatory Visit
Admission: RE | Admit: 2016-01-24 | Discharge: 2016-01-24 | Disposition: A | Discharge status: Home / Self Care | Payer: Medicare Other | Source: Ambulatory Visit | Attending: Radiation Oncology | Admitting: Radiation Oncology

## 2016-01-24 DIAGNOSIS — Z510 Encounter for antineoplastic radiation therapy: Secondary | ICD-10-CM | POA: Diagnosis not present

## 2016-01-24 DIAGNOSIS — N186 End stage renal disease: Secondary | ICD-10-CM | POA: Diagnosis not present

## 2016-01-24 DIAGNOSIS — E119 Type 2 diabetes mellitus without complications: Secondary | ICD-10-CM | POA: Diagnosis not present

## 2016-01-24 DIAGNOSIS — C61 Malignant neoplasm of prostate: Secondary | ICD-10-CM | POA: Diagnosis not present

## 2016-01-24 DIAGNOSIS — N2581 Secondary hyperparathyroidism of renal origin: Secondary | ICD-10-CM | POA: Diagnosis not present

## 2016-01-24 DIAGNOSIS — D631 Anemia in chronic kidney disease: Secondary | ICD-10-CM | POA: Diagnosis not present

## 2016-01-25 ENCOUNTER — Ambulatory Visit
Admission: RE | Admit: 2016-01-25 | Discharge: 2016-01-25 | Disposition: A | Discharge status: Home / Self Care | Payer: Medicare Other | Source: Ambulatory Visit | Attending: Radiation Oncology | Admitting: Radiation Oncology

## 2016-01-25 DIAGNOSIS — C61 Malignant neoplasm of prostate: Secondary | ICD-10-CM | POA: Diagnosis not present

## 2016-01-25 DIAGNOSIS — Z510 Encounter for antineoplastic radiation therapy: Secondary | ICD-10-CM | POA: Diagnosis not present

## 2016-01-26 ENCOUNTER — Encounter: Payer: Self-pay | Admitting: Radiation Oncology

## 2016-01-26 ENCOUNTER — Ambulatory Visit
Admission: RE | Admit: 2016-01-26 | Discharge: 2016-01-26 | Disposition: A | Discharge status: Home / Self Care | Payer: Medicare Other | Source: Ambulatory Visit | Attending: Radiation Oncology | Admitting: Radiation Oncology

## 2016-01-26 VITALS — BP 111/60 | HR 80 | Resp 16 | Wt 227.3 lb

## 2016-01-26 DIAGNOSIS — N186 End stage renal disease: Secondary | ICD-10-CM | POA: Diagnosis not present

## 2016-01-26 DIAGNOSIS — N2581 Secondary hyperparathyroidism of renal origin: Secondary | ICD-10-CM | POA: Diagnosis not present

## 2016-01-26 DIAGNOSIS — C61 Malignant neoplasm of prostate: Secondary | ICD-10-CM

## 2016-01-26 DIAGNOSIS — E119 Type 2 diabetes mellitus without complications: Secondary | ICD-10-CM | POA: Diagnosis not present

## 2016-01-26 DIAGNOSIS — Z510 Encounter for antineoplastic radiation therapy: Secondary | ICD-10-CM | POA: Diagnosis not present

## 2016-01-26 DIAGNOSIS — D631 Anemia in chronic kidney disease: Secondary | ICD-10-CM | POA: Diagnosis not present

## 2016-01-26 NOTE — Progress Notes (Signed)
  Radiation Oncology         [id number]   Name: [name] MRN: [id number]   Date: 01/26/2016  DOB: 12-Apr-1951     Weekly Radiation Therapy Management    ICD-9-CM ICD-10-CM   1. Malignant neoplasm of prostate ([name]) 185 C61     Current Dose: 62.4 Gy  Planned Dose:  78 Gy  Narrative The patient presents for routine under treatment assessment.  Weight and vitals stable. Denies pain. Report fatigue. Reports he urinates only occasionally due to kidney failure and dialysis. Reports new onset dysuria. Denies hematuria. Denies nocturia. Denies diarrhea.  The patient is without complaint. Set-up films were reviewed. The chart was checked.  Physical Findings  weight is 227 lb 4.8 oz (103.103 kg). His blood pressure is 111/60 and his pulse is 80. His respiration is 16 and oxygen saturation is 100%. . Weight essentially stable.  No significant changes. Ambulates with use of walker.  Impression The patient is tolerating radiation.  Plan Continue treatment as planned.          Sheral Apley Tammi Klippel, M.D.   This document serves as a record of services personally performed by Tyler Pita, MD. It was created on his behalf by Lendon Collar, a trained medical scribe. The creation of this record is based on the scribe's personal observations and the provider's statements to them. This document has been checked and approved by the attending provider.

## 2016-01-26 NOTE — Progress Notes (Signed)
Weight and vitals stable. Denies pain. Report fatigue. Reports he urinates only occasionally due to kidney failure and dialysis. Reports new onset dysuria. Denies hematuria. Denies nocturia. Denies diarrhea.  BP 111/60 mmHg  Pulse 80  Resp 16  Wt 227 lb 4.8 oz (103.103 kg)  SpO2 100% Wt Readings from Last 3 Encounters:  01/26/16 227 lb 4.8 oz (103.103 kg)  01/20/16 227 lb 3.2 oz (103.057 kg)  01/13/16 225 lb (102.059 kg)

## 2016-01-27 ENCOUNTER — Ambulatory Visit: Payer: Medicare Other

## 2016-01-27 ENCOUNTER — Ambulatory Visit
Admission: RE | Admit: 2016-01-27 | Discharge: 2016-01-27 | Disposition: A | Discharge status: Home / Self Care | Payer: Medicare Other | Source: Ambulatory Visit | Attending: Radiation Oncology | Admitting: Radiation Oncology

## 2016-01-28 DIAGNOSIS — N2581 Secondary hyperparathyroidism of renal origin: Secondary | ICD-10-CM | POA: Diagnosis not present

## 2016-01-28 DIAGNOSIS — D631 Anemia in chronic kidney disease: Secondary | ICD-10-CM | POA: Diagnosis not present

## 2016-01-28 DIAGNOSIS — N186 End stage renal disease: Secondary | ICD-10-CM | POA: Diagnosis not present

## 2016-01-28 DIAGNOSIS — E119 Type 2 diabetes mellitus without complications: Secondary | ICD-10-CM | POA: Diagnosis not present

## 2016-01-29 DIAGNOSIS — Z992 Dependence on renal dialysis: Secondary | ICD-10-CM | POA: Diagnosis not present

## 2016-01-29 DIAGNOSIS — N186 End stage renal disease: Secondary | ICD-10-CM | POA: Diagnosis not present

## 2016-01-29 DIAGNOSIS — E1129 Type 2 diabetes mellitus with other diabetic kidney complication: Secondary | ICD-10-CM | POA: Diagnosis not present

## 2016-01-30 ENCOUNTER — Ambulatory Visit
Admission: RE | Admit: 2016-01-30 | Discharge: 2016-01-30 | Disposition: A | Discharge status: Home / Self Care | Payer: Medicare Other | Source: Ambulatory Visit | Attending: Radiation Oncology | Admitting: Radiation Oncology

## 2016-01-30 DIAGNOSIS — I6782 Cerebral ischemia: Secondary | ICD-10-CM | POA: Diagnosis not present

## 2016-01-30 DIAGNOSIS — C61 Malignant neoplasm of prostate: Secondary | ICD-10-CM | POA: Diagnosis not present

## 2016-01-30 DIAGNOSIS — Z510 Encounter for antineoplastic radiation therapy: Secondary | ICD-10-CM | POA: Diagnosis not present

## 2016-01-31 ENCOUNTER — Ambulatory Visit
Admission: RE | Admit: 2016-01-31 | Discharge: 2016-01-31 | Disposition: A | Discharge status: Home / Self Care | Payer: Medicare Other | Source: Ambulatory Visit | Attending: Radiation Oncology | Admitting: Radiation Oncology

## 2016-01-31 DIAGNOSIS — N186 End stage renal disease: Secondary | ICD-10-CM | POA: Diagnosis not present

## 2016-01-31 DIAGNOSIS — E119 Type 2 diabetes mellitus without complications: Secondary | ICD-10-CM | POA: Diagnosis not present

## 2016-01-31 DIAGNOSIS — C61 Malignant neoplasm of prostate: Secondary | ICD-10-CM | POA: Diagnosis not present

## 2016-01-31 DIAGNOSIS — Z510 Encounter for antineoplastic radiation therapy: Secondary | ICD-10-CM | POA: Diagnosis not present

## 2016-01-31 DIAGNOSIS — N2581 Secondary hyperparathyroidism of renal origin: Secondary | ICD-10-CM | POA: Diagnosis not present

## 2016-01-31 DIAGNOSIS — D631 Anemia in chronic kidney disease: Secondary | ICD-10-CM | POA: Diagnosis not present

## 2016-02-01 ENCOUNTER — Ambulatory Visit
Admission: RE | Admit: 2016-02-01 | Discharge: 2016-02-01 | Disposition: A | Discharge status: Home / Self Care | Payer: Medicare Other | Source: Ambulatory Visit | Attending: Radiation Oncology | Admitting: Radiation Oncology

## 2016-02-01 ENCOUNTER — Telehealth: Payer: Self-pay | Admitting: Neurology

## 2016-02-01 DIAGNOSIS — Z510 Encounter for antineoplastic radiation therapy: Secondary | ICD-10-CM | POA: Diagnosis not present

## 2016-02-01 DIAGNOSIS — C61 Malignant neoplasm of prostate: Secondary | ICD-10-CM | POA: Diagnosis not present

## 2016-02-01 NOTE — Telephone Encounter (Signed)
MRI brain wo contrast 01/30/2016:  Mild cerebral atrophy.  Chronic microvascular ischemic changes.  Otherwise normal MRI brain with no acute abnormalities.  Tiffany, please inform patient that his MRI brain shows mild age-related changes.  Kealy Lewter K. Posey Pronto, DO

## 2016-02-01 NOTE — Telephone Encounter (Signed)
Patients wife/Barbara was notified of result.

## 2016-02-02 ENCOUNTER — Ambulatory Visit
Admission: RE | Admit: 2016-02-02 | Discharge: 2016-02-02 | Disposition: A | Discharge status: Home / Self Care | Payer: Medicare Other | Source: Ambulatory Visit | Attending: Radiation Oncology | Admitting: Radiation Oncology

## 2016-02-02 DIAGNOSIS — N186 End stage renal disease: Secondary | ICD-10-CM | POA: Diagnosis not present

## 2016-02-02 DIAGNOSIS — Z510 Encounter for antineoplastic radiation therapy: Secondary | ICD-10-CM | POA: Diagnosis not present

## 2016-02-02 DIAGNOSIS — D631 Anemia in chronic kidney disease: Secondary | ICD-10-CM | POA: Diagnosis not present

## 2016-02-02 DIAGNOSIS — E119 Type 2 diabetes mellitus without complications: Secondary | ICD-10-CM | POA: Diagnosis not present

## 2016-02-02 DIAGNOSIS — C61 Malignant neoplasm of prostate: Secondary | ICD-10-CM | POA: Diagnosis not present

## 2016-02-02 DIAGNOSIS — N2581 Secondary hyperparathyroidism of renal origin: Secondary | ICD-10-CM | POA: Diagnosis not present

## 2016-02-03 ENCOUNTER — Ambulatory Visit
Admission: RE | Admit: 2016-02-03 | Discharge: 2016-02-03 | Disposition: A | Discharge status: Home / Self Care | Payer: Medicare Other | Source: Ambulatory Visit | Attending: Radiation Oncology | Admitting: Radiation Oncology

## 2016-02-03 VITALS — BP 74/55 | HR 89 | Temp 98.1°F | Resp 18

## 2016-02-03 DIAGNOSIS — C61 Malignant neoplasm of prostate: Secondary | ICD-10-CM

## 2016-02-03 DIAGNOSIS — Z510 Encounter for antineoplastic radiation therapy: Secondary | ICD-10-CM | POA: Diagnosis not present

## 2016-02-03 NOTE — Progress Notes (Signed)
PAIN: He is currently in no pain.  URINARY: Pt reports urinary retention and flank pain. Pt states they urinate 0 - 1 times per night. On dialysis.   BOWEL: Pt reports Diarrhea daily, he can tolerate it.   OTHER: Pt complains of fatigue, weakness and poor appetite BP 74/55 mmHg  Pulse 89  Temp(Src) 98.1 F (36.7 C) (Oral)  Resp 18  SpO2 100% Wt Readings from Last 3 Encounters:  01/26/16 227 lb 4.8 oz (103.103 kg)  01/20/16 227 lb 3.2 oz (103.057 kg)  01/13/16 225 lb (102.059 kg)   Scale was not working initially, I asked to pt reweigh again due to his low BP, he refused.  He reports he is feeling extremely exhausted.

## 2016-02-03 NOTE — Progress Notes (Signed)
  Radiation Oncology         [phone]   Name: [name] MRN: [id number]   Date: 02/03/2016  DOB: 05-14-1951     Weekly Radiation Therapy Management    ICD-9-CM ICD-10-CM   1. Malignant neoplasm of prostate ([name]) 185 C61     Current Dose: 72.15 Gy  Planned Dose:  78 Gy  Narrative The patient presents for routine under treatment assessment.  Reports urinary retention and flank pain. Patient states they urinate 0-1 times per night. On dialysis, Tuesdays Thursdays and Saturdays. Reports diarrhea daily, noting he can tolerate it. Patient complains of fatigue, weakness, lightheadedness, and poor appetite. Weakness for the last few weeks. He is currently in no pain. Notes his BP is typically elevated. He does not usually take his BP medication on the morning of dialysis.  The patient is without complaint. Set-up films were reviewed. The chart was checked.  Physical Findings Weight essentially stable.  BP is low. Ambulates with the use of a walker.  Impression The patient is tolerating radiation.  Plan Continue treatment as planned. The patient will complete radiation treatment next week. He will return for follow up in 1 month. The patient was given an appointment card today.         Sheral Apley Tammi Klippel, M.D.   This document serves as a record of services personally performed by Tyler Pita, MD. It was created on his behalf by Arlyce Harman, a trained medical scribe. The creation of this record is based on the scribe's personal observations and the provider's statements to them. This document has been checked and approved by the attending provider.

## 2016-02-04 DIAGNOSIS — D631 Anemia in chronic kidney disease: Secondary | ICD-10-CM | POA: Diagnosis not present

## 2016-02-04 DIAGNOSIS — E119 Type 2 diabetes mellitus without complications: Secondary | ICD-10-CM | POA: Diagnosis not present

## 2016-02-04 DIAGNOSIS — N186 End stage renal disease: Secondary | ICD-10-CM | POA: Diagnosis not present

## 2016-02-04 DIAGNOSIS — N2581 Secondary hyperparathyroidism of renal origin: Secondary | ICD-10-CM | POA: Diagnosis not present

## 2016-02-06 ENCOUNTER — Ambulatory Visit
Admission: RE | Admit: 2016-02-06 | Discharge: 2016-02-06 | Disposition: A | Discharge status: Home / Self Care | Payer: Medicare Other | Source: Ambulatory Visit | Attending: Radiation Oncology | Admitting: Radiation Oncology

## 2016-02-06 ENCOUNTER — Ambulatory Visit: Payer: Medicare Other

## 2016-02-06 DIAGNOSIS — Z510 Encounter for antineoplastic radiation therapy: Secondary | ICD-10-CM | POA: Diagnosis not present

## 2016-02-06 DIAGNOSIS — C61 Malignant neoplasm of prostate: Secondary | ICD-10-CM | POA: Diagnosis not present

## 2016-02-07 ENCOUNTER — Ambulatory Visit: Payer: Medicare Other

## 2016-02-07 ENCOUNTER — Ambulatory Visit
Admission: RE | Admit: 2016-02-07 | Discharge: 2016-02-07 | Disposition: A | Discharge status: Home / Self Care | Payer: Medicare Other | Source: Ambulatory Visit | Attending: Radiation Oncology | Admitting: Radiation Oncology

## 2016-02-07 DIAGNOSIS — C61 Malignant neoplasm of prostate: Secondary | ICD-10-CM | POA: Diagnosis not present

## 2016-02-07 DIAGNOSIS — D631 Anemia in chronic kidney disease: Secondary | ICD-10-CM | POA: Diagnosis not present

## 2016-02-07 DIAGNOSIS — N186 End stage renal disease: Secondary | ICD-10-CM | POA: Diagnosis not present

## 2016-02-07 DIAGNOSIS — E119 Type 2 diabetes mellitus without complications: Secondary | ICD-10-CM | POA: Diagnosis not present

## 2016-02-07 DIAGNOSIS — Z510 Encounter for antineoplastic radiation therapy: Secondary | ICD-10-CM | POA: Diagnosis not present

## 2016-02-07 DIAGNOSIS — N2581 Secondary hyperparathyroidism of renal origin: Secondary | ICD-10-CM | POA: Diagnosis not present

## 2016-02-08 ENCOUNTER — Encounter: Payer: Self-pay | Admitting: Radiation Oncology

## 2016-02-08 ENCOUNTER — Ambulatory Visit: Payer: Medicare Other

## 2016-02-08 ENCOUNTER — Ambulatory Visit
Admission: RE | Admit: 2016-02-08 | Discharge: 2016-02-08 | Disposition: A | Discharge status: Home / Self Care | Payer: Medicare Other | Source: Ambulatory Visit | Attending: Radiation Oncology | Admitting: Radiation Oncology

## 2016-02-08 DIAGNOSIS — Z510 Encounter for antineoplastic radiation therapy: Secondary | ICD-10-CM | POA: Diagnosis not present

## 2016-02-08 DIAGNOSIS — C61 Malignant neoplasm of prostate: Secondary | ICD-10-CM | POA: Diagnosis not present

## 2016-02-09 ENCOUNTER — Other Ambulatory Visit (HOSPITAL_COMMUNITY): Payer: Self-pay | Admitting: Internal Medicine

## 2016-02-09 DIAGNOSIS — E119 Type 2 diabetes mellitus without complications: Secondary | ICD-10-CM | POA: Diagnosis not present

## 2016-02-09 DIAGNOSIS — N2581 Secondary hyperparathyroidism of renal origin: Secondary | ICD-10-CM | POA: Diagnosis not present

## 2016-02-09 DIAGNOSIS — N186 End stage renal disease: Secondary | ICD-10-CM | POA: Diagnosis not present

## 2016-02-09 DIAGNOSIS — D631 Anemia in chronic kidney disease: Secondary | ICD-10-CM | POA: Diagnosis not present

## 2016-02-11 DIAGNOSIS — N2581 Secondary hyperparathyroidism of renal origin: Secondary | ICD-10-CM | POA: Diagnosis not present

## 2016-02-11 DIAGNOSIS — E119 Type 2 diabetes mellitus without complications: Secondary | ICD-10-CM | POA: Diagnosis not present

## 2016-02-11 DIAGNOSIS — D631 Anemia in chronic kidney disease: Secondary | ICD-10-CM | POA: Diagnosis not present

## 2016-02-11 DIAGNOSIS — N186 End stage renal disease: Secondary | ICD-10-CM | POA: Diagnosis not present

## 2016-02-13 NOTE — Progress Notes (Signed)
  Radiation Oncology         [phone] ________________________________  Name: [name] MRN: [id number]  Date: 02/08/2016  DOB: November 26, 1950  End of Treatment Note   ICD-9-CM ICD-10-CM    1. Malignant neoplasm of prostate ([place]) 185 C61     DIAGNOSIS: 65 y.o. gentleman with stage T1c adenocarcinoma of the prostate with a Gleason's score of 4+3 and a PSA of 5.65     Indication for treatment:  Curative, Definitive Radiotherapy       Radiation treatment dates:   12/13/2015-02/08/2016  Site/dose: The prostate was treated to 78 Gy in 40 fractions of 1.95 Gy  Beams/energy: The patient was treated with IMRT using volumetric arc therapy delivering 6 MV X-rays to clockwise and counterclockwise circumferential arcs with a 90 degree collimator offset to avoid dose scalloping. Image guidance was performed with daily cone beam CT prior to each fraction to align to gold markers in the prostate and assure proper bladder and rectal fill volumes. Immobilization was achieved with BodyFix custom mold.  Narrative: The patient tolerated radiation treatment relatively well. The patient experienced some minor urinary irritation and modest fatigue. During the end of treatment, he had diarrhea daily and a poor appetite.  Plan: The patient has completed radiation treatment. He will return to radiation oncology clinic for routine followup in one month. I advised him to call or return sooner if he has any questions or concerns related to his recovery or treatment. ________________________________  Sheral Apley. Tammi Klippel, M.D.

## 2016-02-14 DIAGNOSIS — N186 End stage renal disease: Secondary | ICD-10-CM | POA: Diagnosis not present

## 2016-02-14 DIAGNOSIS — N2581 Secondary hyperparathyroidism of renal origin: Secondary | ICD-10-CM | POA: Diagnosis not present

## 2016-02-14 DIAGNOSIS — E119 Type 2 diabetes mellitus without complications: Secondary | ICD-10-CM | POA: Diagnosis not present

## 2016-02-14 DIAGNOSIS — D631 Anemia in chronic kidney disease: Secondary | ICD-10-CM | POA: Diagnosis not present

## 2016-02-15 ENCOUNTER — Encounter: Payer: Medicare Other | Admitting: Neurology

## 2016-02-16 DIAGNOSIS — D631 Anemia in chronic kidney disease: Secondary | ICD-10-CM | POA: Diagnosis not present

## 2016-02-16 DIAGNOSIS — N2581 Secondary hyperparathyroidism of renal origin: Secondary | ICD-10-CM | POA: Diagnosis not present

## 2016-02-16 DIAGNOSIS — E119 Type 2 diabetes mellitus without complications: Secondary | ICD-10-CM | POA: Diagnosis not present

## 2016-02-16 DIAGNOSIS — N186 End stage renal disease: Secondary | ICD-10-CM | POA: Diagnosis not present

## 2016-02-17 ENCOUNTER — Ambulatory Visit (INDEPENDENT_AMBULATORY_CARE_PROVIDER_SITE_OTHER): Payer: Medicare Other | Admitting: Endocrinology

## 2016-02-17 ENCOUNTER — Encounter: Payer: Self-pay | Admitting: Endocrinology

## 2016-02-17 VITALS — BP 90/60 | HR 84 | Temp 98.6°F | Wt 224.4 lb

## 2016-02-17 DIAGNOSIS — E1122 Type 2 diabetes mellitus with diabetic chronic kidney disease: Secondary | ICD-10-CM | POA: Diagnosis not present

## 2016-02-17 DIAGNOSIS — E119 Type 2 diabetes mellitus without complications: Secondary | ICD-10-CM

## 2016-02-17 DIAGNOSIS — N184 Chronic kidney disease, stage 4 (severe): Secondary | ICD-10-CM

## 2016-02-17 DIAGNOSIS — E0520 Thyrotoxicosis with toxic multinodular goiter without thyrotoxic crisis or storm: Secondary | ICD-10-CM

## 2016-02-17 LAB — TSH: TSH: 0.87 u[IU]/mL (ref 0.35–4.50)

## 2016-02-17 LAB — POCT GLYCOSYLATED HEMOGLOBIN (HGB A1C): HEMOGLOBIN A1C: 6.6

## 2016-02-17 NOTE — Progress Notes (Signed)
Pre visit review using our clinic review tool, if applicable. No additional management support is needed unless otherwise documented below in the visit note. 

## 2016-02-17 NOTE — Progress Notes (Signed)
Subjective:    Patient ID: [name], male    DOB: [birth year], 65 y.o.   MRN: [id number]  HPI The state of at least three ongoing medical problems is addressed today: Pt returns for f/u of diabetes mellitus:  DM type: 2 Dx'ed: 123456 Complications: polyneuropathy, renal failure, and CAD.  Therapy: welchol DKA: never.  Severe hypoglycemia: never.  Pancreatitis: never.  Other: med needs has decreased with worsening of renal function.   Interval history:  no cbg record, but states cbg's vary from 90-120.  There is no trend throughout the day.  He denies hypoglycemia.   Hyperthyroidism (due to a multinodular goiter; dx'ed 2009; bx then was benign; nuc med scan showed very low uptake, so tapazole was chosen as rx).  He has not recently taken tapazole.  He has lost weight.   Past Medical History  Diagnosis Date  . Hyperthyroidism   . GASTROENTERITIS, VIRAL 10/14/2009  . ONYCHOMYCOSIS, TOENAILS 12/26/2007  . GOITER, MULTINODULAR 12/26/2007  . HYPERTHYROIDISM 02/02/2010  . DIABETES MELLITUS, TYPE II 02/01/2010  . DYSLIPIDEMIA 06/18/2007  . GOUT 06/18/2007  . Hypocalcemia 06/07/2010  . DEPRESSION 10/14/2009  . PERIPHERAL NEUROPATHY 06/18/2007  . HYPERTENSION 06/18/2007  . CONGESTIVE HEART FAILURE 06/18/2007  . DIASTOLIC HEART FAILURE, CHRONIC 02/06/2009  . Unspecified hypotension 01/30/2010  . PULMONARY NODULE, RIGHT LOWER LOBE 06/08/2009  . GERD 06/18/2007  . BENIGN PROSTATIC HYPERTROPHY 10/14/2009  . GYNECOMASTIA 07/17/2010  . NECK PAIN 07/31/2010  . FOOT PAIN 08/12/2008  . DIZZINESS 07/17/2010  . Other malaise and fatigue 11/24/2009  . GAIT DISTURBANCE 03/03/2010  . DYSPNEA 10/29/2008  . CHEST PAIN 03/29/2010  . TRANSAMINASES, SERUM, ELEVATED 02/01/2010  . COLONIC POLYPS, HX OF 10/14/2009  . Anemia 06/16/2011  . Ischemic cardiomyopathy 06/16/2011  . Hyperparathyroidism, secondary ([place]) 06/16/2011  . OSA on CPAP 10/16/2011  . Hyperlipidemia 10/16/2011  . CAD, NATIVE VESSEL 02/06/2009    saw Dr. Missy Sabins  last jan 2013  . Sleep apnea     cpap machine and o2  . Allergic rhinitis, cause unspecified 02/24/2014  . Transfusion history     none recent  . Hemodialysis access, fistula [facility])     Dialysis T-Th-Sa ([place]) Right upper arm fistula  . ESRD (end stage renal disease) on dialysis ([place]) 08/04/2010    "TTS;  " (04/18/2015)  . Dementia [id number]  . Renal insufficiency   . Depression 09/24/2015  . Prostate cancer [facility])     Past Surgical History  Procedure Laterality Date  . Coronary angioplasty with stent placement  06/11/2008  . Cervical spine surgery  2/09    "to repair nerve problems in my left arm"  . Back surgery  1998  . Esophagogastroduodenoscopy  09/28/2011    Procedure: ESOPHAGOGASTRODUODENOSCOPY (EGD);  Surgeon: Missy Sabins, MD;  Location: [facility] ENDOSCOPY;  Service: Endoscopy;  Laterality: N/A;  . Givens capsule study  09/30/2011    Procedure: GIVENS CAPSULE STUDY;  Surgeon: Jeryl Columbia, MD;  Location: [facility] ENDOSCOPY;  Service: Endoscopy;  Laterality: N/A;  . Cholecystectomy    . Coronary angioplasty with stent placement  06/2007    TAXUS stent to RCA/notes 01/31/2011  . Tonsillectomy    . Av fistula placement  11/07/2011    Procedure: INSERTION OF ARTERIOVENOUS (AV) GORE-TEX GRAFT ARM;  Surgeon: Tinnie Gens, MD;  Location: [place];  Service: Vascular;  Laterality: Left;  . Arteriovenous graft placement Right 2009    forearm/notes 02/01/2011  . Insertion of dialysis catheter  Right 2014  . Insertion of dialysis catheter Left 02/11/2013    Procedure: INSERTION OF DIALYSIS CATHETER;  Surgeon: Conrad Chester Hill, MD;  Location: [place];  Service: Vascular;  Laterality: Left;  Ultrasound guided  . Removal of a dialysis catheter Right 02/11/2013    Procedure: REMOVAL OF A DIALYSIS CATHETER;  Surgeon: Conrad Navesink, MD;  Location: [place];  Service: Vascular;  Laterality: Right;  . Bascilic vein transposition Right 02/27/2013    Procedure: BASCILIC VEIN TRANSPOSITION;  Surgeon:  Mal Misty, MD;  Location: [place];  Service: Vascular;  Laterality: Right;  Right Basilic Vein Transposition   . Shuntogram N/A 09/20/2011    Procedure: Earney Mallet;  Surgeon: Conrad Gueydan, MD;  Location: [facility] CATH LAB;  Service: Cardiovascular;  Laterality: N/A;  . Venogram N/A 01/26/2013    Procedure: VENOGRAM;  Surgeon: Angelia Mould, MD;  Location: [facility] CATH LAB;  Service: Cardiovascular;  Laterality: N/A;  . Esophagogastroduodenoscopy N/A 04/07/2015    Procedure: ESOPHAGOGASTRODUODENOSCOPY (EGD);  Surgeon: Teena Irani, MD;  Location: [facility] ENDOSCOPY;  Service: Endoscopy;  Laterality: N/A;  . Savory dilation N/A 04/07/2015    Procedure: SAVORY DILATION;  Surgeon: Teena Irani, MD;  Location: [facility] ENDOSCOPY;  Service: Endoscopy;  Laterality: N/A;  . Foreign body removal  09/2003    via upper endoscopy/notes 02/12/2011  . Esophagogastroduodenoscopy N/A 04/19/2015    Procedure: ESOPHAGOGASTRODUODENOSCOPY (EGD);  Surgeon: Arta Silence, MD;  Location: [facility] ENDOSCOPY;  Service: Endoscopy;  Laterality: N/A;  . Total knee arthroplasty Right 08/02/2015    Procedure: TOTAL KNEE ARTHROPLASTY;  Surgeon: Renette Butters, MD;  Location: [place];  Service: Orthopedics;  Laterality: Right;    Social History   Social History  . Marital Status: Married    Spouse Name: N/A  . Number of Children: 3  . Years of Education: N/A   Occupational History  . disabled   . formerly Lawyer for [employer].    Social History Main Topics  . Smoking status: Former Smoker -- 1.00 packs/day for 25 years    Types: Cigarettes    Quit date: 10/01/2005  . Smokeless tobacco: Former Systems developer    Quit date: 10/01/2005     Comment: Quit smoking 2007 Smoked x 25 years 1/2 ppd.  . Alcohol Use: No  . Drug Use: No  . Sexual Activity: No   Other Topics Concern  . Not on file   Social History Narrative    Current Outpatient Prescriptions on File Prior to Visit  Medication Sig Dispense Refill  . acetaminophen (TYLENOL)  325 MG tablet Take 1 tablet (325 mg total) by mouth every 4 (four) hours as needed for headache or mild pain.    [name] allopurinol (ZYLOPRIM) 100 MG tablet Take 1 tablet (100 mg total) by mouth daily. (Patient taking differently: Take 100 mg by mouth daily as needed (gout). ) 30 tablet 11  . amLODipine (NORVASC) 10 MG tablet     . carvedilol (COREG) 12.5 MG tablet Take 1 tablet (12.5 mg total) by mouth 2 (two) times daily with a meal. 60 tablet 5  . cetirizine (ZYRTEC) 10 MG tablet Take 1 tablet (10 mg total) by mouth daily. 30 tablet 11  . cinacalcet (SENSIPAR) 60 MG tablet Take 60 mg by mouth 2 (two) times daily.     . citalopram (CELEXA) 10 MG tablet Take 1 tablet (10 mg total) by mouth daily. 90 tablet 3  . colesevelam (WELCHOL) 625 MG tablet Take 3 tablets (1,875 mg total)  by mouth 2 (two) times daily with a meal. 180 tablet 11  . gabapentin (NEURONTIN) 300 MG capsule 1-2 tabs by mouth at bedtime for sleep and pain 180 capsule 3  . lanthanum (FOSRENOL) 1000 MG chewable tablet Chew 1,000 mg by mouth 2 (two) times daily with a meal. Reported on 12/23/2015    . multivitamin (RENA-VIT) TABS tablet Take 1 tablet by mouth daily. Reported on 10/24/2015    . triamcinolone (NASACORT AQ) 55 MCG/ACT AERO nasal inhaler Place 2 sprays into the nose daily. 1 Inhaler 12  . ZETIA 10 MG tablet TAKE ONE TABLET BY MOUTH ONCE DAILY 30 tablet 3   No current facility-administered medications on file prior to visit.    Allergies  Allergen Reactions  . Cephalexin Swelling and Other (See Comments)    Tongue swelling  . Statins Other (See Comments)    Weak muscles  . Ciprofloxacin Rash    Family History  Problem Relation Age of Onset  . Heart disease Father   . Diabetes Father   . Kidney failure Father   . Hypertension Father   . Heart disease Sister   . Cancer Neg Hx     BP 90/60 mmHg  Pulse 84  Temp(Src) 98.6 F (37 C) (Oral)  Wt 224 lb 6 oz (101.776 kg)  SpO2 97%  Review of Systems Denies weight  change.      Objective:   Physical Exam VITAL SIGNS:  See vs page GENERAL: no distress Pulses: dorsalis pedis intact bilat.  MSK: no deformity of the feet CV: no leg edema Skin: no ulcer on the feet. normal color and temp on the feet. Neuro: sensation is intact to touch on the feet, but decreased from normal.   Lab Results  Component Value Date   HGBA1C 6.6 02/17/2016   Fructosamine=328    Assessment & Plan:  DM: overall well-controlled. Please continue the same medication ESRD: in this setting, a1c is again artifactually low Hyperthyroidism: still off tapazole: due for recheck  Patient is advised the following: Patient Instructions  blood tests are requested for you today.  We'll let you know about the results.   check your blood sugar once a day.  vary the time of day when you check, between before the 3 meals, and at bedtime.  also check if you have symptoms of your blood sugar being too high or too low.  please keep a record of the readings and bring it to your next appointment here (or you can bring the meter itself).  You can write it on any piece of paper.  please call us sooner if your blood sugar goes below 70, or if you have a lot of readings over 200. Please come back for a follow-up appointment in 4 months.

## 2016-02-17 NOTE — Patient Instructions (Addendum)
blood tests are requested for you today.  We'll let you know about the results.   check your blood sugar once a day.  vary the time of day when you check, between before the 3 meals, and at bedtime.  also check if you have symptoms of your blood sugar being too high or too low.  please keep a record of the readings and bring it to your next appointment here (or you can bring the meter itself).  You can write it on any piece of paper.  please call us sooner if your blood sugar goes below 70, or if you have a lot of readings over 200. Please come back for a follow-up appointment in 4 months.

## 2016-02-18 DIAGNOSIS — E119 Type 2 diabetes mellitus without complications: Secondary | ICD-10-CM | POA: Diagnosis not present

## 2016-02-18 DIAGNOSIS — D631 Anemia in chronic kidney disease: Secondary | ICD-10-CM | POA: Diagnosis not present

## 2016-02-18 DIAGNOSIS — N2581 Secondary hyperparathyroidism of renal origin: Secondary | ICD-10-CM | POA: Diagnosis not present

## 2016-02-18 DIAGNOSIS — N186 End stage renal disease: Secondary | ICD-10-CM | POA: Diagnosis not present

## 2016-02-18 LAB — FRUCTOSAMINE: Fructosamine: 328 umol/L — ABNORMAL HIGH (ref 0–285)

## 2016-02-20 LAB — T4, FREE: FREE T4: 0.72 ng/dL (ref 0.60–1.60)

## 2016-02-21 DIAGNOSIS — N2581 Secondary hyperparathyroidism of renal origin: Secondary | ICD-10-CM | POA: Diagnosis not present

## 2016-02-21 DIAGNOSIS — N186 End stage renal disease: Secondary | ICD-10-CM | POA: Diagnosis not present

## 2016-02-21 DIAGNOSIS — D631 Anemia in chronic kidney disease: Secondary | ICD-10-CM | POA: Diagnosis not present

## 2016-02-21 DIAGNOSIS — E119 Type 2 diabetes mellitus without complications: Secondary | ICD-10-CM | POA: Diagnosis not present

## 2016-02-22 ENCOUNTER — Ambulatory Visit (INDEPENDENT_AMBULATORY_CARE_PROVIDER_SITE_OTHER): Payer: Medicare Other | Admitting: Neurology

## 2016-02-22 ENCOUNTER — Telehealth: Payer: Self-pay | Admitting: Neurology

## 2016-02-22 DIAGNOSIS — G629 Polyneuropathy, unspecified: Secondary | ICD-10-CM

## 2016-02-22 DIAGNOSIS — M5412 Radiculopathy, cervical region: Secondary | ICD-10-CM | POA: Diagnosis not present

## 2016-02-22 NOTE — Telephone Encounter (Signed)
Results of EMG were discussed with patient which showed severe polyneuropathy and cervical radiculopathy, which most likely explains proximal arm weakness.  Patient reports this has been long standing and is not interested in any further surgeries, even if there was nerve impingement.  MRI cervical spine and PT declined by patient.  He will call our office, if he chooses to proceed with PT.    Donika K. Posey Pronto, DO

## 2016-02-22 NOTE — Procedures (Addendum)
[facility]  [address], [address]  [place], [place] [address] Tel: [phone] Fax:  [phone] Test Date:  02/22/2016  Patient: [name] DOB: 1950-12-18 Physician: Narda Amber, DO  Sex: Male Height: 6\' 1"  Ref Phys: Dr Delice Lesch  ID#: [id number] Temp: 34.3C Technician:    Patient Complaints: This is a 65 year-old gentleman with complex medical history including hypertension, diabetes, CKD on hemodialysis, and prostate cancer undergoing chemotherapy presenting for evaluation of generalized weakness, worse on the left side.  NCV & EMG Findings: Extensive electrodiagnostic testing of the left upper and lower extremity shows: 1. Left median sensory nerve showed prolonged distal peak latency (5.1 ms) and reduced amplitude (7.5 V).  Left radial sensory responses show reduced amplitude (6.5 V).  Left ulnar sensory response is absent. 2. Left median an ulnar motor responses show prolonged distal onset latency (4.7, 3.5 ms) and normal amplitude. There is also mild conduction velocity slowing across the ulnar nerve (48 m/s). 3. Left sural and superficial peroneal sensory responses are absent. 4. Left peroneal (EDB) and tibial motor responses are absent. Left peroneal motor response recording at the tibialis anterior shows reduced amplitude. 5. In the left upper extremity, chronic motor axon loss changes are seen affecting all the tested muscles with more severe changes involving the C5-6 myotomes. There is no evidence of active denervation. 6. In the left lower extremity, chronic motor axon loss changes are seen affecting all the tested muscles and follow a gradient pattern where it is worse distally.  There is no evidence of active denervation.  Impression: 1. The electrophysiologic findings are most consistent with a generalized sensorimotor polyneuropathy, predominantly axon loss in type, affecting the left upper and lower extremities. Overall, these findings are severe in  degree electrically. 2. There is also evidence of a superimposed left C5-C6 radiculopathy, moderate to severe in degree electrically. 3. Multilevel lumbosacral radiculopathies affecting the L3-S1 nerve roots cannot be excluded.   ___________________________ Narda Amber, DO    Nerve Conduction Studies Anti Sensory Summary Table   Site NR Peak (ms) Norm Peak (ms) P-T Amp (V) Norm P-T Amp  Left Median Anti Sensory (2nd Digit)  34.3C  Wrist    5.1 <3.8 7.5 >10  Site 2    5.2  7.7   Site 3    5.1  9.2   Left Radial Anti Sensory (Base 1st Digit)  34.3C  Wrist    2.1 <2.8 6.5 >10  Left Sup Peroneal Anti Sensory (Ant Lat Mall)  34.3C  12 cm NR  <4.6  >3  Left Sural Anti Sensory (Lat Mall)  34.3C  Calf NR  <4.6  >3  Left Ulnar Anti Sensory (5th Digit)  34.3C  Wrist NR  <3.2  >5   Motor Summary Table   Site NR Onset (ms) Norm Onset (ms) O-P Amp (mV) Norm O-P Amp Site1 Site2 Delta-0 (ms) Dist (cm) Vel (m/s) Norm Vel (m/s)  Left Median Motor (Abd Poll Brev)  34.3C  Wrist    4.7 <4.0 6.6 >5 Elbow Wrist 6.5 33.0 51 >50  Elbow    11.2  5.9         Left Peroneal Motor (Ext Dig Brev)  34.3C  Ankle NR  <6.0  >2.5 B Fib Ankle  0.0  >40  B Fib NR     Poplt B Fib  0.0  >40  Poplt NR            Left Peroneal TA Motor (Tib Ant)  34.3C  Fib Head    4.5 <4.5 2.8 >3 Poplit Fib Head 1.7 8.0 47 >40  Poplit    6.2  2.7         Left Tibial Motor (Abd Hall Brev)  34.3C  Ankle NR  <6.0  >4 Knee Ankle  0.0  >40  Knee NR            Left Ulnar Motor (Abd Dig Minimi)  34.3C  Wrist    3.5 <3.1 8.0 >7 B Elbow Wrist 5.8 28.0 48 >50  B Elbow    9.3  7.5  A Elbow B Elbow 2.1 10.0 48 >50  A Elbow    11.4  6.9          H Reflex Studies   NR H-Lat (ms) Lat Norm (ms) L-R H-Lat (ms)  Left Tibial (Gastroc)  34.3C  NR  <35    EMG   Side Muscle Ins Act Fibs Psw Fasc Number Recrt Dur Dur. Amp Amp. Poly Poly. Comment  Left AntTibialis Nml Nml Nml Nml 2- Rapid Many 1+ Some 1+ Some 1+ ATR  Left  Gastroc Nml Nml Nml Nml 2- Rapid Some 1+ Some 1+ Some 1+ N/A  Left Flex Dig Long Nml Nml Nml Nml [facility] Rapid All 1+ All 1+ All 1+ N/A  Left RectFemoris Nml Nml Nml Nml 2- Rapid Some 1+ Some 1+ Some 1+ N/A  Right BicepsFemS Nml Nml Nml Nml 1- Rapid Some 1+ Some 1+ Nml Nml N/A  Left GluteusMed Nml Nml Nml Nml 1- Rapid Some 1+ Some 1+ Nml Nml N/A  Left 1stDorInt Nml Nml Nml Nml 1- Rapid Some 1+ Some 1+ Nml Nml N/A  Left Abd Poll Brev Nml Nml Nml Nml 1- Rapid Few 1+ Few 1+ Nml Nml N/A  Left ABD Dig Min Nml Nml Nml Nml 1- Rapid Few 1+ Few 1+ Nml Nml N/A  Left Ext Indicis Nml Nml Nml Nml 1- Rapid Few 1+ Few 1+ Nml Nml N/A  Left PronatorTeres Nml Nml Nml Nml 2- Rapid Some 1+ Some 1+ Nml Nml N/A  Left Biceps Nml Nml Nml Nml 2- Rapid Some 1+ Some 1+ Nml Nml N/A  Left Triceps Nml Nml Nml Nml 2- Rapid Some 1+ Some 1+ Some 1+ N/A  Left Deltoid Nml Nml Nml Nml 3- Rapid Many 1+ Many 1+ Many 1+ N/A  Left Infraspinatus Nml Nml Nml Nml 2- Rapid Many 1+ Many 1+ Some 1+ N/A      Waveforms:

## 2016-02-23 ENCOUNTER — Encounter: Payer: Self-pay | Admitting: Internal Medicine

## 2016-02-23 DIAGNOSIS — N186 End stage renal disease: Secondary | ICD-10-CM | POA: Diagnosis not present

## 2016-02-23 DIAGNOSIS — E119 Type 2 diabetes mellitus without complications: Secondary | ICD-10-CM | POA: Diagnosis not present

## 2016-02-23 DIAGNOSIS — D631 Anemia in chronic kidney disease: Secondary | ICD-10-CM | POA: Diagnosis not present

## 2016-02-23 DIAGNOSIS — M5412 Radiculopathy, cervical region: Secondary | ICD-10-CM

## 2016-02-23 DIAGNOSIS — N2581 Secondary hyperparathyroidism of renal origin: Secondary | ICD-10-CM | POA: Diagnosis not present

## 2016-02-23 HISTORY — DX: Radiculopathy, cervical region: M54.12

## 2016-02-24 DIAGNOSIS — L602 Onychogryphosis: Secondary | ICD-10-CM | POA: Diagnosis not present

## 2016-02-24 DIAGNOSIS — L84 Corns and callosities: Secondary | ICD-10-CM | POA: Diagnosis not present

## 2016-02-24 DIAGNOSIS — I70293 Other atherosclerosis of native arteries of extremities, bilateral legs: Secondary | ICD-10-CM | POA: Diagnosis not present

## 2016-02-24 DIAGNOSIS — E1351 Other specified diabetes mellitus with diabetic peripheral angiopathy without gangrene: Secondary | ICD-10-CM | POA: Diagnosis not present

## 2016-02-25 DIAGNOSIS — N186 End stage renal disease: Secondary | ICD-10-CM | POA: Diagnosis not present

## 2016-02-25 DIAGNOSIS — N2581 Secondary hyperparathyroidism of renal origin: Secondary | ICD-10-CM | POA: Diagnosis not present

## 2016-02-25 DIAGNOSIS — E119 Type 2 diabetes mellitus without complications: Secondary | ICD-10-CM | POA: Diagnosis not present

## 2016-02-25 DIAGNOSIS — D631 Anemia in chronic kidney disease: Secondary | ICD-10-CM | POA: Diagnosis not present

## 2016-02-28 DIAGNOSIS — D631 Anemia in chronic kidney disease: Secondary | ICD-10-CM | POA: Diagnosis not present

## 2016-02-28 DIAGNOSIS — N2581 Secondary hyperparathyroidism of renal origin: Secondary | ICD-10-CM | POA: Diagnosis not present

## 2016-02-28 DIAGNOSIS — N186 End stage renal disease: Secondary | ICD-10-CM | POA: Diagnosis not present

## 2016-02-28 DIAGNOSIS — E119 Type 2 diabetes mellitus without complications: Secondary | ICD-10-CM | POA: Diagnosis not present

## 2016-02-29 DIAGNOSIS — N186 End stage renal disease: Secondary | ICD-10-CM | POA: Diagnosis not present

## 2016-02-29 DIAGNOSIS — E1129 Type 2 diabetes mellitus with other diabetic kidney complication: Secondary | ICD-10-CM | POA: Diagnosis not present

## 2016-02-29 DIAGNOSIS — Z992 Dependence on renal dialysis: Secondary | ICD-10-CM | POA: Diagnosis not present

## 2016-03-01 DIAGNOSIS — E119 Type 2 diabetes mellitus without complications: Secondary | ICD-10-CM | POA: Diagnosis not present

## 2016-03-01 DIAGNOSIS — D631 Anemia in chronic kidney disease: Secondary | ICD-10-CM | POA: Diagnosis not present

## 2016-03-01 DIAGNOSIS — N186 End stage renal disease: Secondary | ICD-10-CM | POA: Diagnosis not present

## 2016-03-01 DIAGNOSIS — N2581 Secondary hyperparathyroidism of renal origin: Secondary | ICD-10-CM | POA: Diagnosis not present

## 2016-03-03 DIAGNOSIS — N2581 Secondary hyperparathyroidism of renal origin: Secondary | ICD-10-CM | POA: Diagnosis not present

## 2016-03-03 DIAGNOSIS — N186 End stage renal disease: Secondary | ICD-10-CM | POA: Diagnosis not present

## 2016-03-03 DIAGNOSIS — D631 Anemia in chronic kidney disease: Secondary | ICD-10-CM | POA: Diagnosis not present

## 2016-03-03 DIAGNOSIS — E119 Type 2 diabetes mellitus without complications: Secondary | ICD-10-CM | POA: Diagnosis not present

## 2016-03-06 DIAGNOSIS — D631 Anemia in chronic kidney disease: Secondary | ICD-10-CM | POA: Diagnosis not present

## 2016-03-06 DIAGNOSIS — N186 End stage renal disease: Secondary | ICD-10-CM | POA: Diagnosis not present

## 2016-03-06 DIAGNOSIS — E119 Type 2 diabetes mellitus without complications: Secondary | ICD-10-CM | POA: Diagnosis not present

## 2016-03-06 DIAGNOSIS — N2581 Secondary hyperparathyroidism of renal origin: Secondary | ICD-10-CM | POA: Diagnosis not present

## 2016-03-08 DIAGNOSIS — N186 End stage renal disease: Secondary | ICD-10-CM | POA: Diagnosis not present

## 2016-03-08 DIAGNOSIS — N2581 Secondary hyperparathyroidism of renal origin: Secondary | ICD-10-CM | POA: Diagnosis not present

## 2016-03-08 DIAGNOSIS — E119 Type 2 diabetes mellitus without complications: Secondary | ICD-10-CM | POA: Diagnosis not present

## 2016-03-08 DIAGNOSIS — D631 Anemia in chronic kidney disease: Secondary | ICD-10-CM | POA: Diagnosis not present

## 2016-03-10 DIAGNOSIS — D631 Anemia in chronic kidney disease: Secondary | ICD-10-CM | POA: Diagnosis not present

## 2016-03-10 DIAGNOSIS — N186 End stage renal disease: Secondary | ICD-10-CM | POA: Diagnosis not present

## 2016-03-10 DIAGNOSIS — E119 Type 2 diabetes mellitus without complications: Secondary | ICD-10-CM | POA: Diagnosis not present

## 2016-03-10 DIAGNOSIS — N2581 Secondary hyperparathyroidism of renal origin: Secondary | ICD-10-CM | POA: Diagnosis not present

## 2016-03-13 DIAGNOSIS — N2581 Secondary hyperparathyroidism of renal origin: Secondary | ICD-10-CM | POA: Diagnosis not present

## 2016-03-13 DIAGNOSIS — D631 Anemia in chronic kidney disease: Secondary | ICD-10-CM | POA: Diagnosis not present

## 2016-03-13 DIAGNOSIS — E119 Type 2 diabetes mellitus without complications: Secondary | ICD-10-CM | POA: Diagnosis not present

## 2016-03-13 DIAGNOSIS — N186 End stage renal disease: Secondary | ICD-10-CM | POA: Diagnosis not present

## 2016-03-15 ENCOUNTER — Telehealth (HOSPITAL_COMMUNITY): Payer: Self-pay | Admitting: Pharmacist

## 2016-03-15 DIAGNOSIS — N186 End stage renal disease: Secondary | ICD-10-CM | POA: Diagnosis not present

## 2016-03-15 DIAGNOSIS — N2581 Secondary hyperparathyroidism of renal origin: Secondary | ICD-10-CM | POA: Diagnosis not present

## 2016-03-15 DIAGNOSIS — E119 Type 2 diabetes mellitus without complications: Secondary | ICD-10-CM | POA: Diagnosis not present

## 2016-03-15 DIAGNOSIS — D631 Anemia in chronic kidney disease: Secondary | ICD-10-CM | POA: Diagnosis not present

## 2016-03-15 MED ORDER — EZETIMIBE 10 MG PO TABS: 10.0000 mg | ORAL_TABLET | Freq: Every day | ORAL | Status: DC

## 2016-03-15 NOTE — Telephone Encounter (Signed)
Zetia brand not covered so will send Rx for generic ezetimibe.   Ruta Hinds. Velva Harman, PharmD, BCPS, CPP Clinical Pharmacist Pager: [phone] Phone: [phone] 03/15/2016 3:22 PM

## 2016-03-17 DIAGNOSIS — E119 Type 2 diabetes mellitus without complications: Secondary | ICD-10-CM | POA: Diagnosis not present

## 2016-03-17 DIAGNOSIS — N2581 Secondary hyperparathyroidism of renal origin: Secondary | ICD-10-CM | POA: Diagnosis not present

## 2016-03-17 DIAGNOSIS — D631 Anemia in chronic kidney disease: Secondary | ICD-10-CM | POA: Diagnosis not present

## 2016-03-17 DIAGNOSIS — N186 End stage renal disease: Secondary | ICD-10-CM | POA: Diagnosis not present

## 2016-03-20 DIAGNOSIS — E119 Type 2 diabetes mellitus without complications: Secondary | ICD-10-CM | POA: Diagnosis not present

## 2016-03-20 DIAGNOSIS — N2581 Secondary hyperparathyroidism of renal origin: Secondary | ICD-10-CM | POA: Diagnosis not present

## 2016-03-20 DIAGNOSIS — N186 End stage renal disease: Secondary | ICD-10-CM | POA: Diagnosis not present

## 2016-03-20 DIAGNOSIS — D631 Anemia in chronic kidney disease: Secondary | ICD-10-CM | POA: Diagnosis not present

## 2016-03-22 ENCOUNTER — Ambulatory Visit: Payer: Self-pay | Admitting: Radiation Oncology

## 2016-03-22 DIAGNOSIS — N186 End stage renal disease: Secondary | ICD-10-CM | POA: Diagnosis not present

## 2016-03-22 DIAGNOSIS — N2581 Secondary hyperparathyroidism of renal origin: Secondary | ICD-10-CM | POA: Diagnosis not present

## 2016-03-22 DIAGNOSIS — D631 Anemia in chronic kidney disease: Secondary | ICD-10-CM | POA: Diagnosis not present

## 2016-03-22 DIAGNOSIS — E119 Type 2 diabetes mellitus without complications: Secondary | ICD-10-CM | POA: Diagnosis not present

## 2016-03-24 DIAGNOSIS — D631 Anemia in chronic kidney disease: Secondary | ICD-10-CM | POA: Diagnosis not present

## 2016-03-24 DIAGNOSIS — E119 Type 2 diabetes mellitus without complications: Secondary | ICD-10-CM | POA: Diagnosis not present

## 2016-03-24 DIAGNOSIS — N186 End stage renal disease: Secondary | ICD-10-CM | POA: Diagnosis not present

## 2016-03-24 DIAGNOSIS — N2581 Secondary hyperparathyroidism of renal origin: Secondary | ICD-10-CM | POA: Diagnosis not present

## 2016-03-26 ENCOUNTER — Ambulatory Visit
Admission: RE | Admit: 2016-03-26 | Discharge: 2016-03-26 | Disposition: A | Discharge status: Home / Self Care | Payer: Medicare Other | Source: Ambulatory Visit | Attending: Radiation Oncology | Admitting: Radiation Oncology

## 2016-03-26 ENCOUNTER — Encounter: Payer: Self-pay | Admitting: Radiation Oncology

## 2016-03-26 VITALS — BP 155/73 | HR 74 | Resp 16 | Wt 220.7 lb

## 2016-03-26 DIAGNOSIS — R9721 Rising PSA following treatment for malignant neoplasm of prostate: Secondary | ICD-10-CM | POA: Diagnosis not present

## 2016-03-26 DIAGNOSIS — Z992 Dependence on renal dialysis: Secondary | ICD-10-CM | POA: Diagnosis not present

## 2016-03-26 DIAGNOSIS — C61 Malignant neoplasm of prostate: Secondary | ICD-10-CM | POA: Diagnosis not present

## 2016-03-26 NOTE — Progress Notes (Signed)
Weight and vitals stable. Denies pain at this time. Urinary output is minimal related to effects of dialysis. Denies nocturia, dysuria or hematuria. Reports modest fatigue continues again related to effects of dialysis. Scheduled for lab work on Wednesday and follow up with urologist on Monday. Denies diarrhea. Reports a normal appetite.   BP 155/73 mmHg  Pulse 74  Resp 16  Wt 220 lb 11.2 oz (100.109 kg)  SpO2 100% Wt Readings from Last 3 Encounters:  03/26/16 220 lb 11.2 oz (100.109 kg)  02/17/16 224 lb 6 oz (101.776 kg)  01/26/16 227 lb 4.8 oz (103.103 kg)

## 2016-03-26 NOTE — Progress Notes (Addendum)
Radiation Oncology         [phone] ________________________________  Name: [name] MRN: [id number]  Date: 03/26/2016  DOB: [birth year]  Follow-Up Visit Note  CC: [name], MD  Alyson Ingles Candee Furbish, MD  Diagnosis:   Stage T1c adenocarcinoma of the prostate with a Gleason's score of 4+3 and a PSA of 5.65  Interval Since Last Radiation:  6 weeks  12/13/15-02/08/16: 78 Gy to the prostate in 40 fractions   Narrative:  The patient returns today for routine follow-up.  During the course of radiotherapy, the patient did have some burning in sensation of urgency, he is on dialysis and does not urinate regularly.       On review of systems, the patient has been dealing with his fatigue. He continues his dialysis three times a week. He denies any hematuria, and reports his dysuria has improved since completing therapy. He denies any bowel dysfunction. He is not experiencing chest pain, shortness of breath, fevers, or chills. No other complaints are verbalized.                         ALLERGIES:  is allergic to cephalexin; statins; and ciprofloxacin.  Meds: Current Outpatient Prescriptions  Medication Sig Dispense Refill  . acetaminophen (TYLENOL) 325 MG tablet Take 1 tablet (325 mg total) by mouth every 4 (four) hours as needed for headache or mild pain.    [name] allopurinol (ZYLOPRIM) 100 MG tablet Take 1 tablet (100 mg total) by mouth daily. (Patient taking differently: Take 100 mg by mouth daily as needed (gout). ) 30 tablet 11  . carvedilol (COREG) 12.5 MG tablet Take 1 tablet (12.5 mg total) by mouth 2 (two) times daily with a meal. 60 tablet 5  . cetirizine (ZYRTEC) 10 MG tablet Take 1 tablet (10 mg total) by mouth daily. 30 tablet 11  . cinacalcet (SENSIPAR) 60 MG tablet Take 60 mg by mouth 2 (two) times daily.     . citalopram (CELEXA) 10 MG tablet Take 1 tablet (10 mg total) by mouth daily. 90 tablet 3  . colesevelam (WELCHOL) 625 MG tablet Take 3 tablets (1,875 mg total) by mouth  2 (two) times daily with a meal. 180 tablet 11  . ezetimibe (ZETIA) 10 MG tablet Take 1 tablet (10 mg total) by mouth daily. 90 tablet 3  . gabapentin (NEURONTIN) 300 MG capsule 1-2 tabs by mouth at bedtime for sleep and pain 180 capsule 3  . lanthanum (FOSRENOL) 1000 MG chewable tablet Chew 1,000 mg by mouth 2 (two) times daily with a meal. Reported on 12/23/2015    . multivitamin (RENA-VIT) TABS tablet Take 1 tablet by mouth daily. Reported on 10/24/2015    . triamcinolone (NASACORT AQ) 55 MCG/ACT AERO nasal inhaler Place 2 sprays into the nose daily. 1 Inhaler 12  . amLODipine (NORVASC) 10 MG tablet Reported on 03/26/2016     No current facility-administered medications for this encounter.    Physical Findings:   weight is 220 lb 11.2 oz (100.109 kg). His blood pressure is 155/73 and his pulse is 74. His respiration is 16 and oxygen saturation is 100%.  In general this is a well appearing African American male in no acute distress. He's alert and oriented x4 and appropriate throughout the examination. Cardiopulmonary assessment is negative for acute distress and he exhibits normal effort.   Lab Findings: Lab Results  Component Value Date   WBC 6.1 10/31/2015   HGB 11.6* 10/31/2015  HCT 37.8* 10/31/2015   MCV 87.5 10/31/2015   PLT 325.0 10/31/2015     Radiographic Findings: No results found.  Impression/Plan: 1. Stage T1c adenocarcinoma of the prostate with a Gleason's score of 4+3 and a PSA of 5.65. The patient tolerated radiotherapy quite well overall, and symptoms related to treatmentappear to be improving. He will follow-up with Dr. Noah Delaine to begin surveillance of his prostate cancer with serial PSAs. We would be happy to see him in the future if he has additional questions or concerns regarding his previous treatment, or there is additional need for radiotherapy in future. He states agreement and understanding. 2. Survivorship. I discussed with the patient the rationale for  meeting was traversed clinic, he is not interested in this at this time due to his wife's upcoming medical appointments. He would like to have some communication with Mike Craze, NP and a copy of his survivorship careplan.      Carola Rhine, PAC

## 2016-03-26 NOTE — Addendum Note (Signed)
Encounter addended by: Heywood Footman, RN on: 03/26/2016 10:28 AM<BR>     Documentation filed: Charges VN

## 2016-03-27 DIAGNOSIS — D631 Anemia in chronic kidney disease: Secondary | ICD-10-CM | POA: Diagnosis not present

## 2016-03-27 DIAGNOSIS — N186 End stage renal disease: Secondary | ICD-10-CM | POA: Diagnosis not present

## 2016-03-27 DIAGNOSIS — E119 Type 2 diabetes mellitus without complications: Secondary | ICD-10-CM | POA: Diagnosis not present

## 2016-03-27 DIAGNOSIS — N2581 Secondary hyperparathyroidism of renal origin: Secondary | ICD-10-CM | POA: Diagnosis not present

## 2016-03-28 DIAGNOSIS — C61 Malignant neoplasm of prostate: Secondary | ICD-10-CM | POA: Diagnosis not present

## 2016-03-29 DIAGNOSIS — D631 Anemia in chronic kidney disease: Secondary | ICD-10-CM | POA: Diagnosis not present

## 2016-03-29 DIAGNOSIS — N2581 Secondary hyperparathyroidism of renal origin: Secondary | ICD-10-CM | POA: Diagnosis not present

## 2016-03-29 DIAGNOSIS — E119 Type 2 diabetes mellitus without complications: Secondary | ICD-10-CM | POA: Diagnosis not present

## 2016-03-29 DIAGNOSIS — N186 End stage renal disease: Secondary | ICD-10-CM | POA: Diagnosis not present

## 2016-03-30 DIAGNOSIS — E1129 Type 2 diabetes mellitus with other diabetic kidney complication: Secondary | ICD-10-CM | POA: Diagnosis not present

## 2016-03-30 DIAGNOSIS — R140 Abdominal distension (gaseous): Secondary | ICD-10-CM | POA: Diagnosis not present

## 2016-03-30 DIAGNOSIS — N186 End stage renal disease: Secondary | ICD-10-CM | POA: Diagnosis not present

## 2016-03-30 DIAGNOSIS — K219 Gastro-esophageal reflux disease without esophagitis: Secondary | ICD-10-CM | POA: Diagnosis not present

## 2016-03-30 DIAGNOSIS — Z992 Dependence on renal dialysis: Secondary | ICD-10-CM | POA: Diagnosis not present

## 2016-03-31 DIAGNOSIS — N2581 Secondary hyperparathyroidism of renal origin: Secondary | ICD-10-CM | POA: Diagnosis not present

## 2016-03-31 DIAGNOSIS — D631 Anemia in chronic kidney disease: Secondary | ICD-10-CM | POA: Diagnosis not present

## 2016-03-31 DIAGNOSIS — N186 End stage renal disease: Secondary | ICD-10-CM | POA: Diagnosis not present

## 2016-03-31 DIAGNOSIS — E119 Type 2 diabetes mellitus without complications: Secondary | ICD-10-CM | POA: Diagnosis not present

## 2016-04-02 DIAGNOSIS — C61 Malignant neoplasm of prostate: Secondary | ICD-10-CM | POA: Diagnosis not present

## 2016-04-03 DIAGNOSIS — D631 Anemia in chronic kidney disease: Secondary | ICD-10-CM | POA: Diagnosis not present

## 2016-04-03 DIAGNOSIS — N2581 Secondary hyperparathyroidism of renal origin: Secondary | ICD-10-CM | POA: Diagnosis not present

## 2016-04-03 DIAGNOSIS — E119 Type 2 diabetes mellitus without complications: Secondary | ICD-10-CM | POA: Diagnosis not present

## 2016-04-03 DIAGNOSIS — N186 End stage renal disease: Secondary | ICD-10-CM | POA: Diagnosis not present

## 2016-04-05 ENCOUNTER — Telehealth (HOSPITAL_COMMUNITY): Payer: Self-pay | Admitting: Vascular Surgery

## 2016-04-05 DIAGNOSIS — N2581 Secondary hyperparathyroidism of renal origin: Secondary | ICD-10-CM | POA: Diagnosis not present

## 2016-04-05 DIAGNOSIS — N186 End stage renal disease: Secondary | ICD-10-CM | POA: Diagnosis not present

## 2016-04-05 DIAGNOSIS — D631 Anemia in chronic kidney disease: Secondary | ICD-10-CM | POA: Diagnosis not present

## 2016-04-05 DIAGNOSIS — E119 Type 2 diabetes mellitus without complications: Secondary | ICD-10-CM | POA: Diagnosis not present

## 2016-04-05 NOTE — Telephone Encounter (Signed)
Pt wife called pt is having some problems with bp dropping 67/40 pt wife believe he needs to be ASAP.[name] PLEASE ADVISE   [id number]

## 2016-04-05 NOTE — Telephone Encounter (Signed)
Discussed w/Dr Bensimhon, he states since pt his HD he should f/u with them regarding BP, attempted to call pt back and Left message to call back

## 2016-04-06 NOTE — Telephone Encounter (Signed)
Spoke w/pt's wife, she states HD stopped his Amlodipine but BP still drops low.  sch f/u appt w/Dr Bensimhon on 7/26, she will f/u w/HD and pcp in the mean time

## 2016-04-07 DIAGNOSIS — E119 Type 2 diabetes mellitus without complications: Secondary | ICD-10-CM | POA: Diagnosis not present

## 2016-04-07 DIAGNOSIS — N2581 Secondary hyperparathyroidism of renal origin: Secondary | ICD-10-CM | POA: Diagnosis not present

## 2016-04-07 DIAGNOSIS — N186 End stage renal disease: Secondary | ICD-10-CM | POA: Diagnosis not present

## 2016-04-07 DIAGNOSIS — D631 Anemia in chronic kidney disease: Secondary | ICD-10-CM | POA: Diagnosis not present

## 2016-04-10 DIAGNOSIS — N186 End stage renal disease: Secondary | ICD-10-CM | POA: Diagnosis not present

## 2016-04-10 DIAGNOSIS — E119 Type 2 diabetes mellitus without complications: Secondary | ICD-10-CM | POA: Diagnosis not present

## 2016-04-10 DIAGNOSIS — D631 Anemia in chronic kidney disease: Secondary | ICD-10-CM | POA: Diagnosis not present

## 2016-04-10 DIAGNOSIS — N2581 Secondary hyperparathyroidism of renal origin: Secondary | ICD-10-CM | POA: Diagnosis not present

## 2016-04-12 DIAGNOSIS — D631 Anemia in chronic kidney disease: Secondary | ICD-10-CM | POA: Diagnosis not present

## 2016-04-12 DIAGNOSIS — N186 End stage renal disease: Secondary | ICD-10-CM | POA: Diagnosis not present

## 2016-04-12 DIAGNOSIS — N2581 Secondary hyperparathyroidism of renal origin: Secondary | ICD-10-CM | POA: Diagnosis not present

## 2016-04-12 DIAGNOSIS — E119 Type 2 diabetes mellitus without complications: Secondary | ICD-10-CM | POA: Diagnosis not present

## 2016-04-14 DIAGNOSIS — D631 Anemia in chronic kidney disease: Secondary | ICD-10-CM | POA: Diagnosis not present

## 2016-04-14 DIAGNOSIS — N186 End stage renal disease: Secondary | ICD-10-CM | POA: Diagnosis not present

## 2016-04-14 DIAGNOSIS — N2581 Secondary hyperparathyroidism of renal origin: Secondary | ICD-10-CM | POA: Diagnosis not present

## 2016-04-14 DIAGNOSIS — E119 Type 2 diabetes mellitus without complications: Secondary | ICD-10-CM | POA: Diagnosis not present

## 2016-04-17 DIAGNOSIS — E119 Type 2 diabetes mellitus without complications: Secondary | ICD-10-CM | POA: Diagnosis not present

## 2016-04-17 DIAGNOSIS — N186 End stage renal disease: Secondary | ICD-10-CM | POA: Diagnosis not present

## 2016-04-17 DIAGNOSIS — D631 Anemia in chronic kidney disease: Secondary | ICD-10-CM | POA: Diagnosis not present

## 2016-04-17 DIAGNOSIS — N2581 Secondary hyperparathyroidism of renal origin: Secondary | ICD-10-CM | POA: Diagnosis not present

## 2016-04-19 DIAGNOSIS — N186 End stage renal disease: Secondary | ICD-10-CM | POA: Diagnosis not present

## 2016-04-19 DIAGNOSIS — D631 Anemia in chronic kidney disease: Secondary | ICD-10-CM | POA: Diagnosis not present

## 2016-04-19 DIAGNOSIS — N2581 Secondary hyperparathyroidism of renal origin: Secondary | ICD-10-CM | POA: Diagnosis not present

## 2016-04-19 DIAGNOSIS — E119 Type 2 diabetes mellitus without complications: Secondary | ICD-10-CM | POA: Diagnosis not present

## 2016-04-21 DIAGNOSIS — D631 Anemia in chronic kidney disease: Secondary | ICD-10-CM | POA: Diagnosis not present

## 2016-04-21 DIAGNOSIS — N186 End stage renal disease: Secondary | ICD-10-CM | POA: Diagnosis not present

## 2016-04-21 DIAGNOSIS — N2581 Secondary hyperparathyroidism of renal origin: Secondary | ICD-10-CM | POA: Diagnosis not present

## 2016-04-21 DIAGNOSIS — E119 Type 2 diabetes mellitus without complications: Secondary | ICD-10-CM | POA: Diagnosis not present

## 2016-04-23 DIAGNOSIS — T82858D Stenosis of vascular prosthetic devices, implants and grafts, subsequent encounter: Secondary | ICD-10-CM | POA: Diagnosis not present

## 2016-04-23 DIAGNOSIS — Z992 Dependence on renal dialysis: Secondary | ICD-10-CM | POA: Diagnosis not present

## 2016-04-23 DIAGNOSIS — N186 End stage renal disease: Secondary | ICD-10-CM | POA: Diagnosis not present

## 2016-04-23 DIAGNOSIS — I871 Compression of vein: Secondary | ICD-10-CM | POA: Diagnosis not present

## 2016-04-24 DIAGNOSIS — E119 Type 2 diabetes mellitus without complications: Secondary | ICD-10-CM | POA: Diagnosis not present

## 2016-04-24 DIAGNOSIS — D631 Anemia in chronic kidney disease: Secondary | ICD-10-CM | POA: Diagnosis not present

## 2016-04-24 DIAGNOSIS — N186 End stage renal disease: Secondary | ICD-10-CM | POA: Diagnosis not present

## 2016-04-24 DIAGNOSIS — N2581 Secondary hyperparathyroidism of renal origin: Secondary | ICD-10-CM | POA: Diagnosis not present

## 2016-04-25 ENCOUNTER — Ambulatory Visit (HOSPITAL_COMMUNITY)
Admission: RE | Admit: 2016-04-25 | Discharge: 2016-04-25 | Disposition: A | Discharge status: Home / Self Care | Payer: Medicare Other | Source: Ambulatory Visit | Attending: Internal Medicine | Admitting: Internal Medicine

## 2016-04-25 ENCOUNTER — Encounter (HOSPITAL_COMMUNITY): Payer: Self-pay | Admitting: Internal Medicine

## 2016-04-25 VITALS — BP 122/80 | HR 101 | Wt 227.5 lb

## 2016-04-25 DIAGNOSIS — N186 End stage renal disease: Secondary | ICD-10-CM | POA: Insufficient documentation

## 2016-04-25 DIAGNOSIS — Z955 Presence of coronary angioplasty implant and graft: Secondary | ICD-10-CM | POA: Insufficient documentation

## 2016-04-25 DIAGNOSIS — Z992 Dependence on renal dialysis: Secondary | ICD-10-CM | POA: Diagnosis not present

## 2016-04-25 DIAGNOSIS — I2510 Atherosclerotic heart disease of native coronary artery without angina pectoris: Secondary | ICD-10-CM

## 2016-04-25 DIAGNOSIS — F329 Major depressive disorder, single episode, unspecified: Secondary | ICD-10-CM | POA: Insufficient documentation

## 2016-04-25 DIAGNOSIS — Z7982 Long term (current) use of aspirin: Secondary | ICD-10-CM | POA: Insufficient documentation

## 2016-04-25 DIAGNOSIS — M109 Gout, unspecified: Secondary | ICD-10-CM | POA: Insufficient documentation

## 2016-04-25 DIAGNOSIS — Z8546 Personal history of malignant neoplasm of prostate: Secondary | ICD-10-CM | POA: Diagnosis not present

## 2016-04-25 DIAGNOSIS — R0789 Other chest pain: Secondary | ICD-10-CM

## 2016-04-25 DIAGNOSIS — I953 Hypotension of hemodialysis: Secondary | ICD-10-CM

## 2016-04-25 DIAGNOSIS — Z923 Personal history of irradiation: Secondary | ICD-10-CM | POA: Insufficient documentation

## 2016-04-25 DIAGNOSIS — Z79899 Other long term (current) drug therapy: Secondary | ICD-10-CM | POA: Diagnosis not present

## 2016-04-25 DIAGNOSIS — N2581 Secondary hyperparathyroidism of renal origin: Secondary | ICD-10-CM | POA: Diagnosis not present

## 2016-04-25 DIAGNOSIS — F0390 Unspecified dementia without behavioral disturbance: Secondary | ICD-10-CM | POA: Diagnosis not present

## 2016-04-25 DIAGNOSIS — I255 Ischemic cardiomyopathy: Secondary | ICD-10-CM | POA: Diagnosis not present

## 2016-04-25 DIAGNOSIS — I5022 Chronic systolic (congestive) heart failure: Secondary | ICD-10-CM | POA: Diagnosis not present

## 2016-04-25 DIAGNOSIS — E785 Hyperlipidemia, unspecified: Secondary | ICD-10-CM | POA: Diagnosis not present

## 2016-04-25 DIAGNOSIS — I428 Other cardiomyopathies: Secondary | ICD-10-CM | POA: Diagnosis not present

## 2016-04-25 DIAGNOSIS — M5412 Radiculopathy, cervical region: Secondary | ICD-10-CM | POA: Diagnosis not present

## 2016-04-25 DIAGNOSIS — G4733 Obstructive sleep apnea (adult) (pediatric): Secondary | ICD-10-CM | POA: Diagnosis not present

## 2016-04-25 DIAGNOSIS — R079 Chest pain, unspecified: Secondary | ICD-10-CM | POA: Insufficient documentation

## 2016-04-25 DIAGNOSIS — E1122 Type 2 diabetes mellitus with diabetic chronic kidney disease: Secondary | ICD-10-CM | POA: Diagnosis not present

## 2016-04-25 DIAGNOSIS — I132 Hypertensive heart and chronic kidney disease with heart failure and with stage 5 chronic kidney disease, or end stage renal disease: Secondary | ICD-10-CM | POA: Insufficient documentation

## 2016-04-25 DIAGNOSIS — K219 Gastro-esophageal reflux disease without esophagitis: Secondary | ICD-10-CM | POA: Insufficient documentation

## 2016-04-25 DIAGNOSIS — E1142 Type 2 diabetes mellitus with diabetic polyneuropathy: Secondary | ICD-10-CM | POA: Diagnosis not present

## 2016-04-25 DIAGNOSIS — I2583 Coronary atherosclerosis due to lipid rich plaque: Secondary | ICD-10-CM

## 2016-04-25 DIAGNOSIS — M1712 Unilateral primary osteoarthritis, left knee: Secondary | ICD-10-CM | POA: Diagnosis not present

## 2016-04-25 MED ORDER — MIDODRINE HCL 2.5 MG PO TABS: 2.5000 mg | ORAL_TABLET | Freq: Two times a day (BID) | ORAL | 3 refills | Status: DC

## 2016-04-25 NOTE — Patient Instructions (Signed)
Start Midodrine 2.5 mg Twice daily ONLY ON HD DAYS OR IF BLOOD PRESSURE IS LESS THAN 90  Your physician has requested that you have a lexiscan myoview. For further information please visit [email or web address]. Please follow instruction sheet, as given.  We will contact you in 6 months to schedule your next appointment.

## 2016-04-25 NOTE — Progress Notes (Signed)
Patient ID: [name], male   DOB: Oct 18, 1950, 65 y.o.   MRN: [id number]  [facility] CLINIC NOTE  Patient ID: [name], male   DOB: 10-27-1950, 65 y.o.   MRN: [id number]  PCP: Dr [name] Nephrologist: Dr Mercy Moore  HPI: [name] is 65 year old male with PMH: obesity, DM, COPD on nighttime oxygen, sleep apnea on CPAP, ESRD- HD, CAD, S/P Taxus drug-eluting stent to the right coronary in September [id number], chronic systolic/diastolic heart failure (Unable to tolerate statins so placed on Welchol), prostate CA s/p XRT   Had RHC in 2/11 which showed low pressures (PCWP = 2) , PA 26/16 (22)  and normal cardiac output.  Echo 12/12: EF 55% Myoview 10/15/12: EF 33%. LV Wall Motion: There is global hypokinesis. The LV is markedly enlarged. Small fixed apical defect.  cMRI 02/19/13: EF 37% dilated LV. diffuse HK Echo 6/15: EF 25-30%  Echo 4/16: EF 50-55%  Evaluated at [facility] for kidney transplant but he was not felt to be candidate (11/2012) due to poor mobility, DM, and coronary disease.   CPX 06/04/13   Resting HR: 76 Peak HR: 109 (70% age predicted max HR) BP rest: 107/70 BP peak: 154/69 Peak VO2: 11.8 (53.7% predicted peak VO2) - when corrected to ibw pVO2 15.7 VE/VCO2 slope: 28.8 OUES: 1.90 Peak RER: 1.06  Ve/MVV 34.7%  Follow up: Returns for f/u. Says BP has been running very low systolics often in the [id number]. Goes lower in HD. They often have to take him off early. Stopped carvedilol and amlodipine without much benefit. Very fatigued. Gets CP occasionally. Breathing is "real good." Edema controlled with HD.   Past Medical History:  Diagnosis Date  . Allergic rhinitis, cause unspecified 02/24/2014  . Anemia 06/16/2011  . BENIGN PROSTATIC HYPERTROPHY 10/14/2009  . CAD, NATIVE VESSEL 02/06/2009   saw Dr. Missy Sabins last jan 2013  . Cervical radiculopathy, chronic 02/23/2016   Right c5-6 by NCS/EMG  . CHEST PAIN 03/29/2010  . COLONIC POLYPS, HX OF 10/14/2009  . CONGESTIVE HEART FAILURE 06/18/2007  .  Dementia [id number]  . DEPRESSION 10/14/2009  . Depression 09/24/2015  . DIABETES MELLITUS, TYPE II 02/01/2010  . DIASTOLIC HEART FAILURE, CHRONIC 02/06/2009  . DIZZINESS 07/17/2010  . DYSLIPIDEMIA 06/18/2007  . DYSPNEA 10/29/2008  . ESRD (end stage renal disease) on dialysis ([place]) 08/04/2010   "TTS;  " (04/18/2015)  . FOOT PAIN 08/12/2008  . GAIT DISTURBANCE 03/03/2010  . GASTROENTERITIS, VIRAL 10/14/2009  . GERD 06/18/2007  . GOITER, MULTINODULAR 12/26/2007  . GOUT 06/18/2007  . GYNECOMASTIA 07/17/2010  . Hemodialysis access, fistula [facility])    Dialysis [facility] ([place]) Right upper arm fistula  . Hyperlipidemia 10/16/2011  . Hyperparathyroidism, secondary ([name]) 06/16/2011  . HYPERTENSION 06/18/2007  . Hyperthyroidism   . HYPERTHYROIDISM 02/02/2010  . Hypocalcemia 06/07/2010  . Ischemic cardiomyopathy 06/16/2011  . NECK PAIN 07/31/2010  . ONYCHOMYCOSIS, TOENAILS 12/26/2007  . OSA on CPAP 10/16/2011  . Other malaise and fatigue 11/24/2009  . PERIPHERAL NEUROPATHY 06/18/2007  . Prostate cancer ([place])   . PULMONARY NODULE, RIGHT LOWER LOBE 06/08/2009  . Renal insufficiency   . Sleep apnea    cpap machine and o2  . TRANSAMINASES, SERUM, ELEVATED 02/01/2010  . Transfusion history    none recent  . Unspecified hypotension 01/30/2010    Current Outpatient Prescriptions  Medication Sig Dispense Refill  . acetaminophen (TYLENOL) 325 MG tablet Take 1 tablet (325 mg total) by mouth every 4 (four) hours as needed for headache  or mild pain.    [name] allopurinol (ZYLOPRIM) 100 MG tablet Take 100 mg by mouth as needed (for gout).    [name] aspirin 81 MG tablet Take 81 mg by mouth daily.    . cinacalcet (SENSIPAR) 60 MG tablet Take 60 mg by mouth 2 (two) times daily.     . citalopram (CELEXA) 10 MG tablet Take 1 tablet (10 mg total) by mouth daily. 90 tablet 3  . colesevelam (WELCHOL) 625 MG tablet Take 3 tablets (1,875 mg total) by mouth 2 (two) times daily with a meal. 180 tablet 11  . ezetimibe  (ZETIA) 10 MG tablet Take 1 tablet (10 mg total) by mouth daily. 90 tablet 3  . gabapentin (NEURONTIN) 300 MG capsule 1-2 tabs by mouth at bedtime for sleep and pain 180 capsule 3  . lanthanum (FOSRENOL) 1000 MG chewable tablet Chew 1,000 mg by mouth 2 (two) times daily with a meal. Reported on 12/23/2015    . multivitamin (RENA-VIT) TABS tablet Take 1 tablet by mouth daily. Reported on 10/24/2015     No current facility-administered medications for this encounter.     Vitals:   04/25/16 1532  BP: 122/80  Pulse: (!) 101    PHYSICAL EXAM: General:  Walks with walker No resp difficulty Wife present HEENT: normal Neck: supple. JVP flat. Carotids 2+ bilaterally; no bruits. No lymphadenopathy or thryomegaly appreciated. Cor: PMI normal. Regular rate & rhythm. No rubs, gallops 2/6 SEM LUSB. Lungs: clear Abdomen: obese  soft, nontender, nondistended. No hepatosplenomegaly. No bruits or masses. Good bowel sounds. Extremities: no cyanosis, clubbing, rash, edema.  Neuro: alert & orientedx3, cranial nerves grossly intact. Moves all 4 extremities w/o difficulty. Affect pleasant.   ASSESSMENT & PLAN:  1. Chronic Systolic Heart Fatigue:  Mixed ICM/NICM, EF 37% per c-MRI. EF 25-30% (Echo 6/15) Most recent EF 50% in 4/16 --chronic NYHAI III. Volume status well managed by HD --will recheck EF on Myoview 2. CAD --having intermittent CP. Will check Myoview -- Continue ASA. Off b-blocker due to low BP. Has not been able to tolerate statins due to myositis. Continue Zetia and Welchol.  3. HTN  - very labile. Having periods of low BP especially with HD.  - start Midodrine 2.5 bid on HD days only and as needed for SBP < 90. Can titrate with Dr. Mercy Moore as needed. 4. ESRD -   HD Tues/Thur/Sat  Baily Serpe,MD 4:04 PM

## 2016-04-26 DIAGNOSIS — N186 End stage renal disease: Secondary | ICD-10-CM | POA: Diagnosis not present

## 2016-04-26 DIAGNOSIS — D631 Anemia in chronic kidney disease: Secondary | ICD-10-CM | POA: Diagnosis not present

## 2016-04-26 DIAGNOSIS — E119 Type 2 diabetes mellitus without complications: Secondary | ICD-10-CM | POA: Diagnosis not present

## 2016-04-26 DIAGNOSIS — N2581 Secondary hyperparathyroidism of renal origin: Secondary | ICD-10-CM | POA: Diagnosis not present

## 2016-04-28 DIAGNOSIS — D631 Anemia in chronic kidney disease: Secondary | ICD-10-CM | POA: Diagnosis not present

## 2016-04-28 DIAGNOSIS — E119 Type 2 diabetes mellitus without complications: Secondary | ICD-10-CM | POA: Diagnosis not present

## 2016-04-28 DIAGNOSIS — N186 End stage renal disease: Secondary | ICD-10-CM | POA: Diagnosis not present

## 2016-04-28 DIAGNOSIS — N2581 Secondary hyperparathyroidism of renal origin: Secondary | ICD-10-CM | POA: Diagnosis not present

## 2016-04-29 DIAGNOSIS — I959 Hypotension, unspecified: Secondary | ICD-10-CM | POA: Insufficient documentation

## 2016-04-30 DIAGNOSIS — N186 End stage renal disease: Secondary | ICD-10-CM | POA: Diagnosis not present

## 2016-04-30 DIAGNOSIS — Z992 Dependence on renal dialysis: Secondary | ICD-10-CM | POA: Diagnosis not present

## 2016-04-30 DIAGNOSIS — E1129 Type 2 diabetes mellitus with other diabetic kidney complication: Secondary | ICD-10-CM | POA: Diagnosis not present

## 2016-05-01 DIAGNOSIS — D631 Anemia in chronic kidney disease: Secondary | ICD-10-CM | POA: Diagnosis not present

## 2016-05-01 DIAGNOSIS — E119 Type 2 diabetes mellitus without complications: Secondary | ICD-10-CM | POA: Diagnosis not present

## 2016-05-01 DIAGNOSIS — N186 End stage renal disease: Secondary | ICD-10-CM | POA: Diagnosis not present

## 2016-05-01 DIAGNOSIS — N2581 Secondary hyperparathyroidism of renal origin: Secondary | ICD-10-CM | POA: Diagnosis not present

## 2016-05-03 DIAGNOSIS — N2581 Secondary hyperparathyroidism of renal origin: Secondary | ICD-10-CM | POA: Diagnosis not present

## 2016-05-03 DIAGNOSIS — D631 Anemia in chronic kidney disease: Secondary | ICD-10-CM | POA: Diagnosis not present

## 2016-05-03 DIAGNOSIS — E119 Type 2 diabetes mellitus without complications: Secondary | ICD-10-CM | POA: Diagnosis not present

## 2016-05-03 DIAGNOSIS — N186 End stage renal disease: Secondary | ICD-10-CM | POA: Diagnosis not present

## 2016-05-03 IMAGING — CR DG PELVIS 1-2V
1 series · 1 of 1 positions shown · non-contrast
Comparison: None.

CLINICAL DATA: Pelvic pain

EXAM:
PELVIS - 1-2 VIEW

[w pelvis upright]
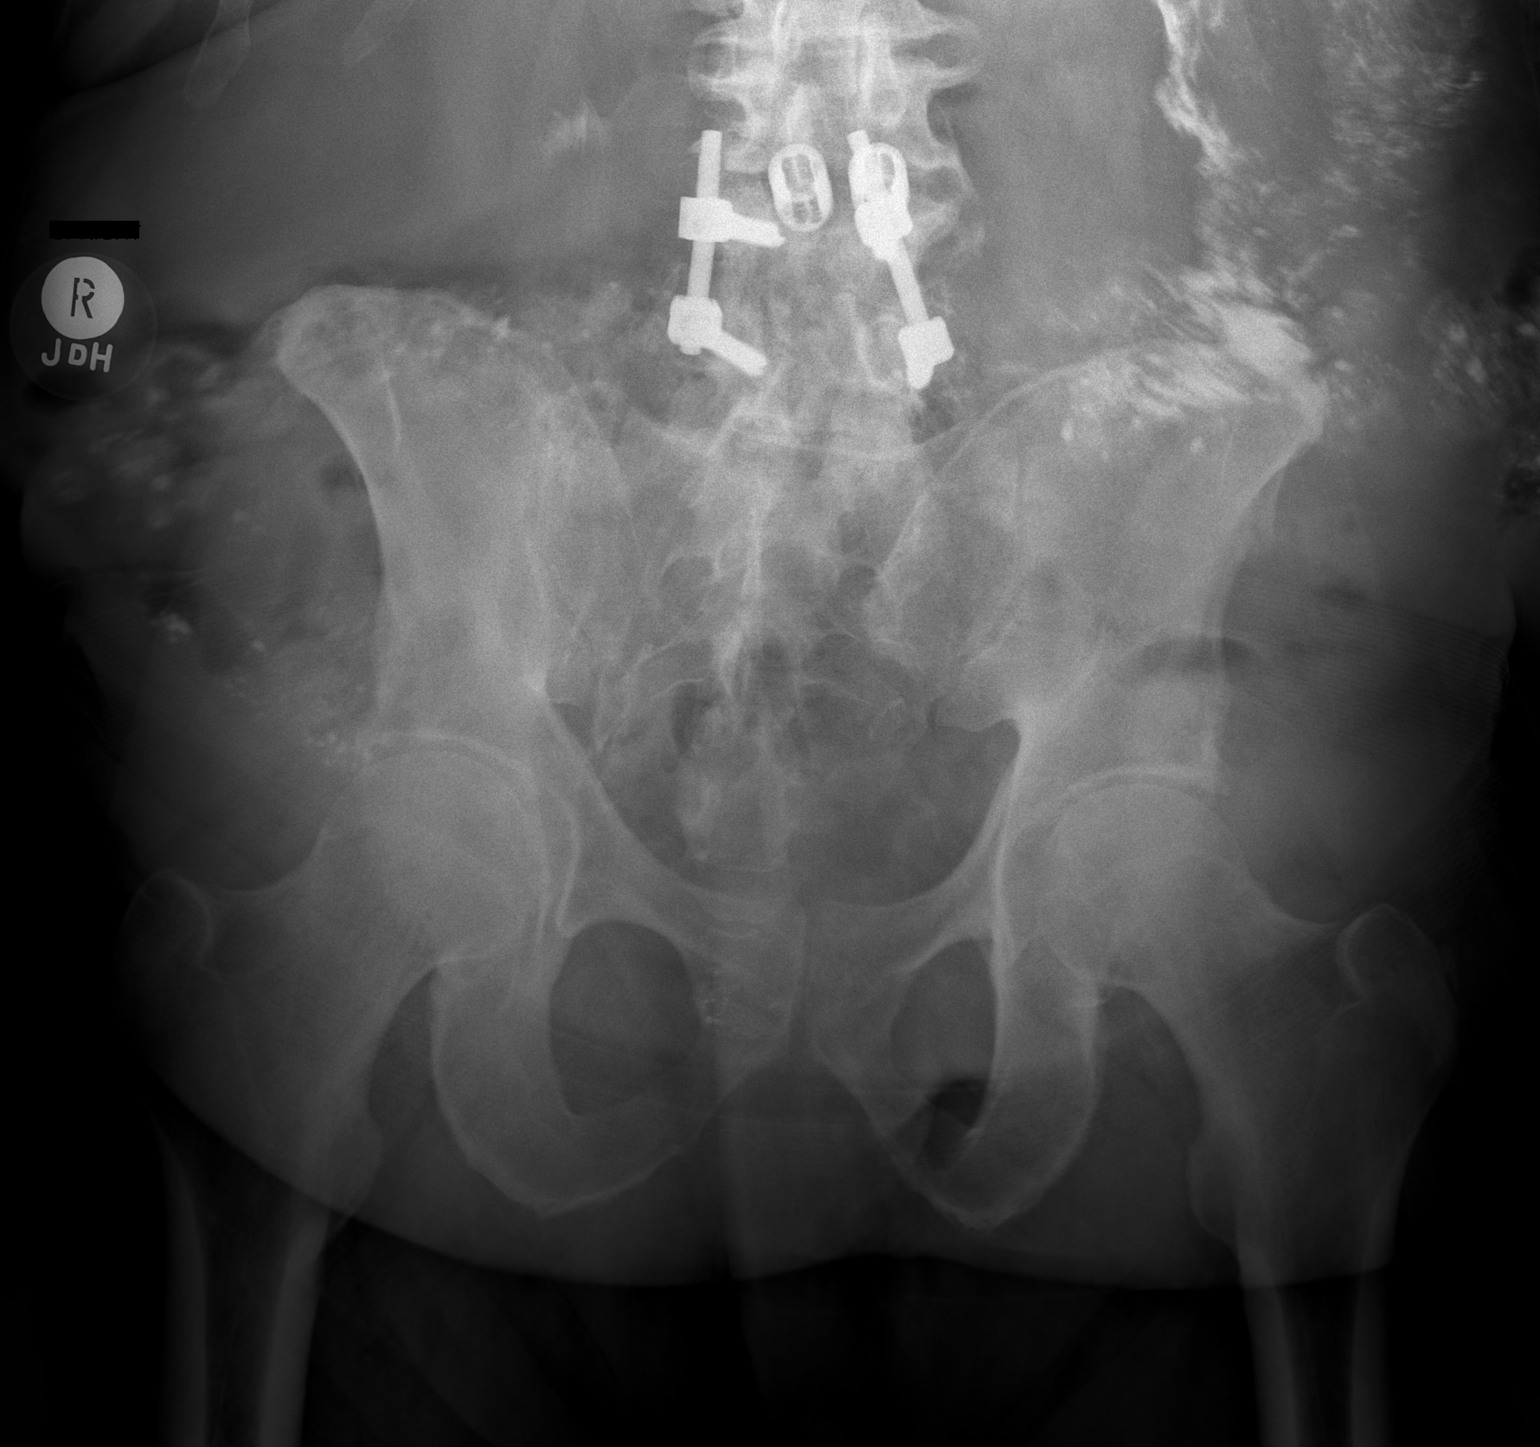

[1 of 1 positions shown; findings below may reference images not displayed]

FINDINGS: Single frontal view of the pelvis submitted. Postsurgical changes
are noted lower lumbar spine. Study is limited by diffuse osteopenia
and patient's large body habitus. No acute fracture or subluxation.
Mild degenerative changes bilateral hip joints with mild narrowing
superior hip joint space. Mild degenerative changes pubic symphysis.
IMPRESSION: No acute fracture or subluxation.  Mild degenerative changes.

## 2016-05-05 DIAGNOSIS — N186 End stage renal disease: Secondary | ICD-10-CM | POA: Diagnosis not present

## 2016-05-05 DIAGNOSIS — N2581 Secondary hyperparathyroidism of renal origin: Secondary | ICD-10-CM | POA: Diagnosis not present

## 2016-05-05 DIAGNOSIS — D631 Anemia in chronic kidney disease: Secondary | ICD-10-CM | POA: Diagnosis not present

## 2016-05-05 DIAGNOSIS — E119 Type 2 diabetes mellitus without complications: Secondary | ICD-10-CM | POA: Diagnosis not present

## 2016-05-08 DIAGNOSIS — N2581 Secondary hyperparathyroidism of renal origin: Secondary | ICD-10-CM | POA: Diagnosis not present

## 2016-05-08 DIAGNOSIS — E119 Type 2 diabetes mellitus without complications: Secondary | ICD-10-CM | POA: Diagnosis not present

## 2016-05-08 DIAGNOSIS — N186 End stage renal disease: Secondary | ICD-10-CM | POA: Diagnosis not present

## 2016-05-08 DIAGNOSIS — D631 Anemia in chronic kidney disease: Secondary | ICD-10-CM | POA: Diagnosis not present

## 2016-05-10 DIAGNOSIS — N2581 Secondary hyperparathyroidism of renal origin: Secondary | ICD-10-CM | POA: Diagnosis not present

## 2016-05-10 DIAGNOSIS — N186 End stage renal disease: Secondary | ICD-10-CM | POA: Diagnosis not present

## 2016-05-10 DIAGNOSIS — E119 Type 2 diabetes mellitus without complications: Secondary | ICD-10-CM | POA: Diagnosis not present

## 2016-05-10 DIAGNOSIS — D631 Anemia in chronic kidney disease: Secondary | ICD-10-CM | POA: Diagnosis not present

## 2016-05-12 DIAGNOSIS — N2581 Secondary hyperparathyroidism of renal origin: Secondary | ICD-10-CM | POA: Diagnosis not present

## 2016-05-12 DIAGNOSIS — N186 End stage renal disease: Secondary | ICD-10-CM | POA: Diagnosis not present

## 2016-05-12 DIAGNOSIS — D631 Anemia in chronic kidney disease: Secondary | ICD-10-CM | POA: Diagnosis not present

## 2016-05-12 DIAGNOSIS — E119 Type 2 diabetes mellitus without complications: Secondary | ICD-10-CM | POA: Diagnosis not present

## 2016-05-15 DIAGNOSIS — N186 End stage renal disease: Secondary | ICD-10-CM | POA: Diagnosis not present

## 2016-05-15 DIAGNOSIS — N2581 Secondary hyperparathyroidism of renal origin: Secondary | ICD-10-CM | POA: Diagnosis not present

## 2016-05-15 DIAGNOSIS — E119 Type 2 diabetes mellitus without complications: Secondary | ICD-10-CM | POA: Diagnosis not present

## 2016-05-15 DIAGNOSIS — D631 Anemia in chronic kidney disease: Secondary | ICD-10-CM | POA: Diagnosis not present

## 2016-05-17 DIAGNOSIS — N186 End stage renal disease: Secondary | ICD-10-CM | POA: Diagnosis not present

## 2016-05-17 DIAGNOSIS — D631 Anemia in chronic kidney disease: Secondary | ICD-10-CM | POA: Diagnosis not present

## 2016-05-17 DIAGNOSIS — N2581 Secondary hyperparathyroidism of renal origin: Secondary | ICD-10-CM | POA: Diagnosis not present

## 2016-05-17 DIAGNOSIS — E119 Type 2 diabetes mellitus without complications: Secondary | ICD-10-CM | POA: Diagnosis not present

## 2016-05-18 DIAGNOSIS — Z992 Dependence on renal dialysis: Secondary | ICD-10-CM | POA: Diagnosis not present

## 2016-05-18 DIAGNOSIS — T82858D Stenosis of vascular prosthetic devices, implants and grafts, subsequent encounter: Secondary | ICD-10-CM | POA: Diagnosis not present

## 2016-05-18 DIAGNOSIS — N186 End stage renal disease: Secondary | ICD-10-CM | POA: Diagnosis not present

## 2016-05-18 DIAGNOSIS — I871 Compression of vein: Secondary | ICD-10-CM | POA: Diagnosis not present

## 2016-05-19 DIAGNOSIS — N186 End stage renal disease: Secondary | ICD-10-CM | POA: Diagnosis not present

## 2016-05-19 DIAGNOSIS — D631 Anemia in chronic kidney disease: Secondary | ICD-10-CM | POA: Diagnosis not present

## 2016-05-19 DIAGNOSIS — E119 Type 2 diabetes mellitus without complications: Secondary | ICD-10-CM | POA: Diagnosis not present

## 2016-05-19 DIAGNOSIS — N2581 Secondary hyperparathyroidism of renal origin: Secondary | ICD-10-CM | POA: Diagnosis not present

## 2016-05-22 ENCOUNTER — Telehealth (HOSPITAL_COMMUNITY): Payer: Self-pay | Admitting: *Deleted

## 2016-05-22 DIAGNOSIS — N186 End stage renal disease: Secondary | ICD-10-CM | POA: Diagnosis not present

## 2016-05-22 DIAGNOSIS — E119 Type 2 diabetes mellitus without complications: Secondary | ICD-10-CM | POA: Diagnosis not present

## 2016-05-22 DIAGNOSIS — N2581 Secondary hyperparathyroidism of renal origin: Secondary | ICD-10-CM | POA: Diagnosis not present

## 2016-05-22 DIAGNOSIS — D631 Anemia in chronic kidney disease: Secondary | ICD-10-CM | POA: Diagnosis not present

## 2016-05-22 NOTE — Telephone Encounter (Signed)
Patient given detailed instructions per Myocardial Perfusion Study Information Sheet for the test on 05/25/16 at 1130. Patient notified to arrive 15 minutes early and that it is imperative to arrive on time for appointment to keep from having the test rescheduled.  If you need to cancel or reschedule your appointment, please call the office within 24 hours of your appointment. Failure to do so may result in a cancellation of your appointment, and a $50 no show fee. Patient verbalized understanding.Tysin Salada, Ranae Palms

## 2016-05-23 IMAGING — DX DG KNEE 1-2V PORT*R*
2 series · 2 of 2 positions shown · non-contrast
Comparison: 01/04/2015

CLINICAL DATA: Right total knee arthroplasty.

EXAM:
PORTABLE RIGHT KNEE - 1-2 VIEW

[knee ap]
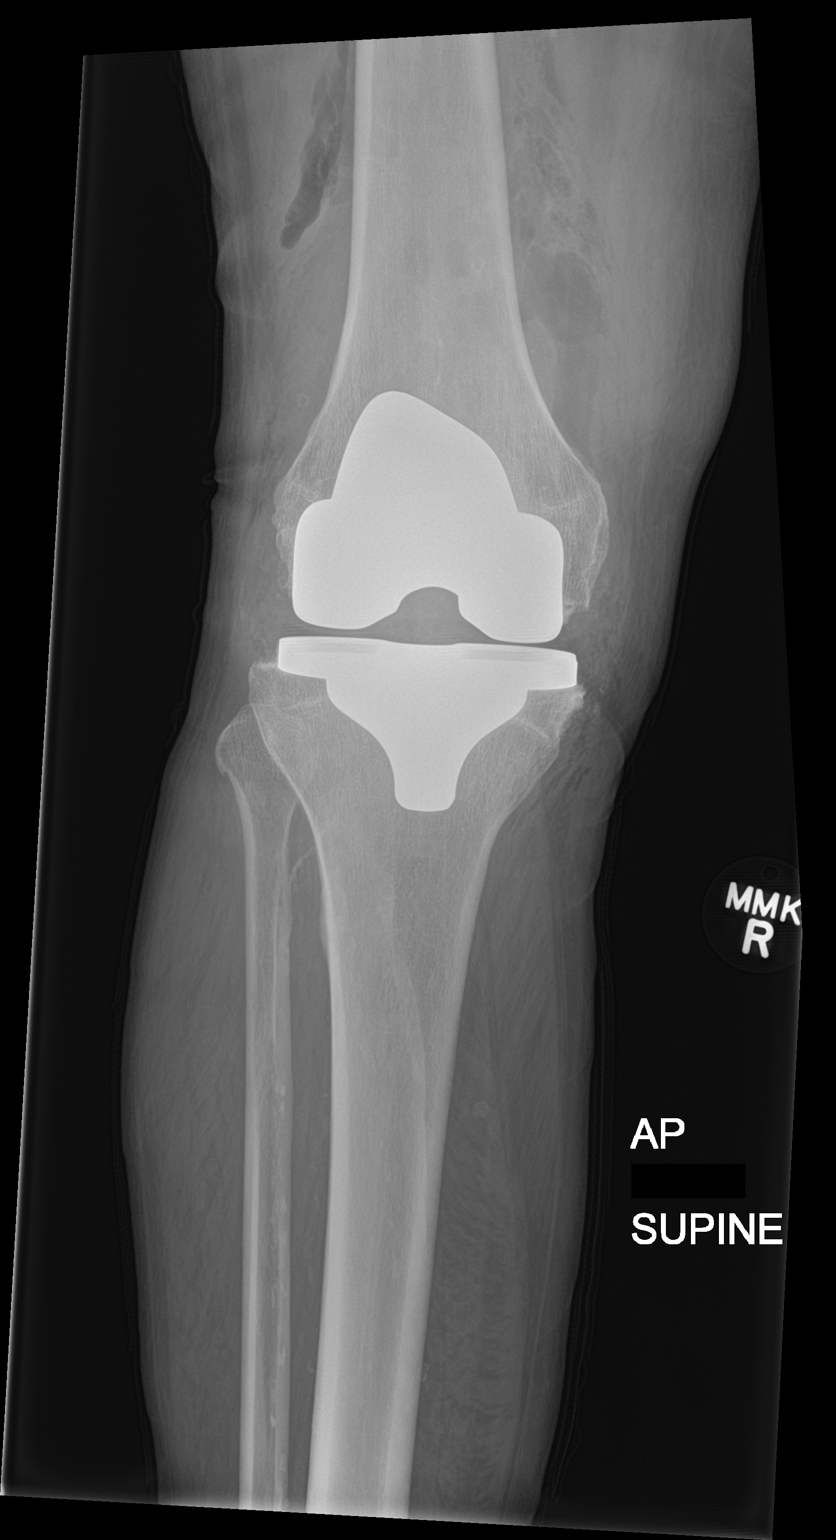

[knee lat]
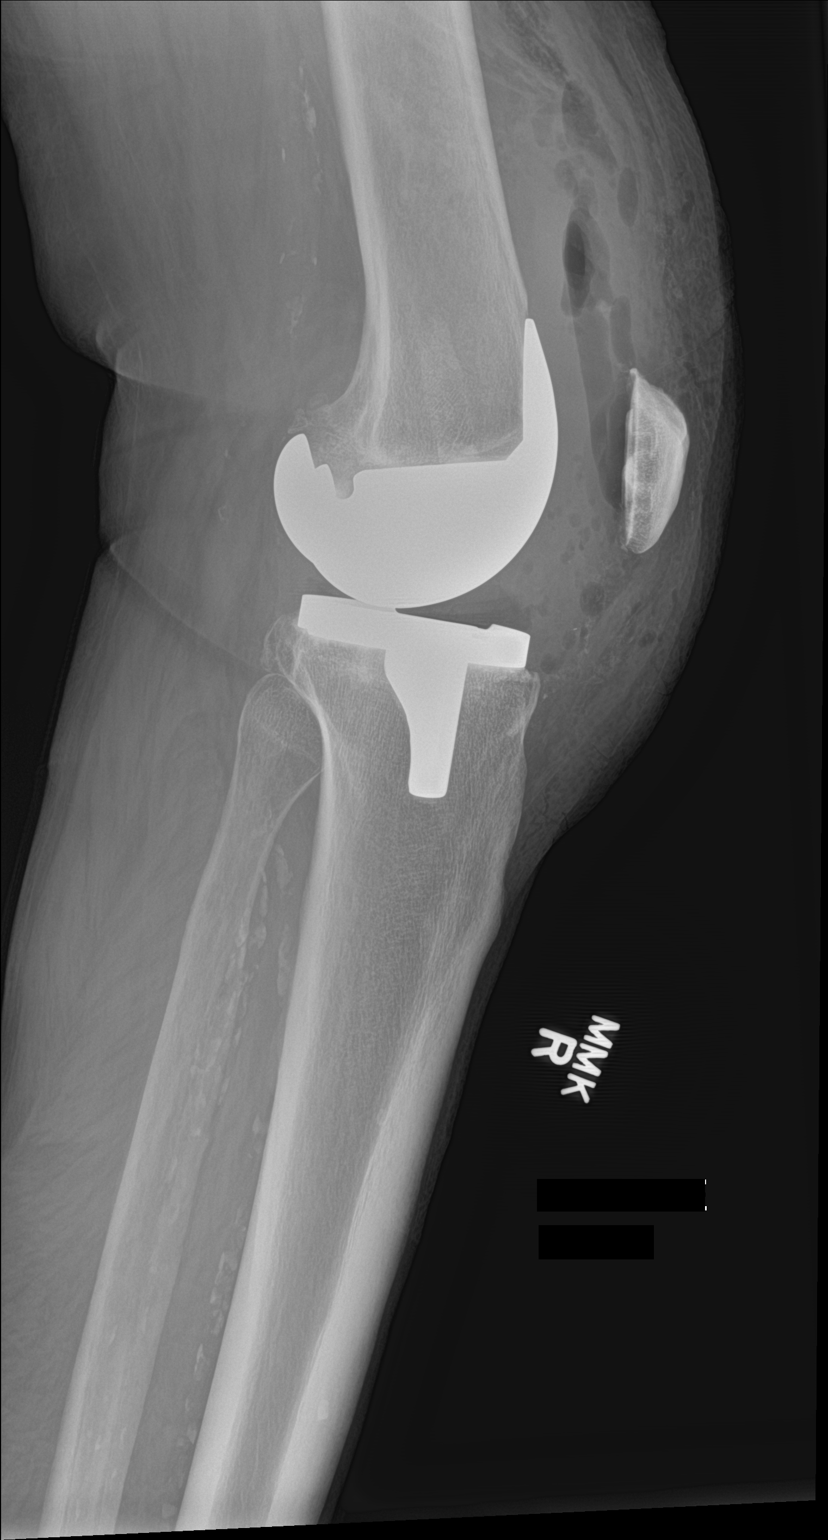

[2 of 2 positions shown; findings below may reference images not displayed]

FINDINGS: Right total knee arthroplasty identified. There is no evidence of
subluxation or dislocation.

Soft tissue postoperative changes are present.

No complicating features are noted.
IMPRESSION: Right total knee arthroplasty and postoperative changes. No definite
complicating features.

## 2016-05-24 DIAGNOSIS — D631 Anemia in chronic kidney disease: Secondary | ICD-10-CM | POA: Diagnosis not present

## 2016-05-24 DIAGNOSIS — N186 End stage renal disease: Secondary | ICD-10-CM | POA: Diagnosis not present

## 2016-05-24 DIAGNOSIS — E119 Type 2 diabetes mellitus without complications: Secondary | ICD-10-CM | POA: Diagnosis not present

## 2016-05-24 DIAGNOSIS — N2581 Secondary hyperparathyroidism of renal origin: Secondary | ICD-10-CM | POA: Diagnosis not present

## 2016-05-25 ENCOUNTER — Ambulatory Visit (HOSPITAL_COMMUNITY): Payer: Medicare Other | Attending: Cardiology

## 2016-05-25 DIAGNOSIS — E119 Type 2 diabetes mellitus without complications: Secondary | ICD-10-CM | POA: Insufficient documentation

## 2016-05-25 DIAGNOSIS — I10 Essential (primary) hypertension: Secondary | ICD-10-CM | POA: Diagnosis not present

## 2016-05-25 DIAGNOSIS — R0789 Other chest pain: Secondary | ICD-10-CM | POA: Diagnosis not present

## 2016-05-25 MED ORDER — TECHNETIUM TC 99M TETROFOSMIN IV KIT
10.5000 | PACK | Freq: Once | INTRAVENOUS | Status: AC | PRN
  Administered 2016-05-25: 11 via INTRAVENOUS
  Filled 2016-05-25: qty 11

## 2016-05-25 MED ORDER — TECHNETIUM TC 99M TETROFOSMIN IV KIT
32.9000 | PACK | Freq: Once | INTRAVENOUS | Status: AC | PRN
  Administered 2016-05-25: 32.9 via INTRAVENOUS
  Filled 2016-05-25: qty 33

## 2016-05-25 MED ORDER — REGADENOSON 0.4 MG/5ML IV SOLN
0.4000 mg | Freq: Once | INTRAVENOUS | Status: AC
  Administered 2016-05-25: 0.4 mg via INTRAVENOUS

## 2016-05-26 DIAGNOSIS — E119 Type 2 diabetes mellitus without complications: Secondary | ICD-10-CM | POA: Diagnosis not present

## 2016-05-26 DIAGNOSIS — D631 Anemia in chronic kidney disease: Secondary | ICD-10-CM | POA: Diagnosis not present

## 2016-05-26 DIAGNOSIS — N2581 Secondary hyperparathyroidism of renal origin: Secondary | ICD-10-CM | POA: Diagnosis not present

## 2016-05-26 DIAGNOSIS — N186 End stage renal disease: Secondary | ICD-10-CM | POA: Diagnosis not present

## 2016-05-27 LAB — MYOCARDIAL PERFUSION IMAGING
CSEPPHR: 100 {beats}/min
LHR: 0.34
LVDIAVOL: 155 mL (ref 62–150)
LVSYSVOL: 78 mL
Rest HR: 83 {beats}/min
SDS: 2
SRS: 2
SSS: 4
TID: 0.95

## 2016-05-29 DIAGNOSIS — D631 Anemia in chronic kidney disease: Secondary | ICD-10-CM | POA: Diagnosis not present

## 2016-05-29 DIAGNOSIS — E119 Type 2 diabetes mellitus without complications: Secondary | ICD-10-CM | POA: Diagnosis not present

## 2016-05-29 DIAGNOSIS — N186 End stage renal disease: Secondary | ICD-10-CM | POA: Diagnosis not present

## 2016-05-29 DIAGNOSIS — N2581 Secondary hyperparathyroidism of renal origin: Secondary | ICD-10-CM | POA: Diagnosis not present

## 2016-05-29 IMAGING — CR DG CHEST 2V
2 series · 2 of 2 positions shown · non-contrast
Comparison: 06/26/2013

CLINICAL DATA: Sudden onset of substernal chest pain tonight and
shortness of breath. Recent knee surgery.

EXAM:
CHEST  2 VIEW

[chest pa]
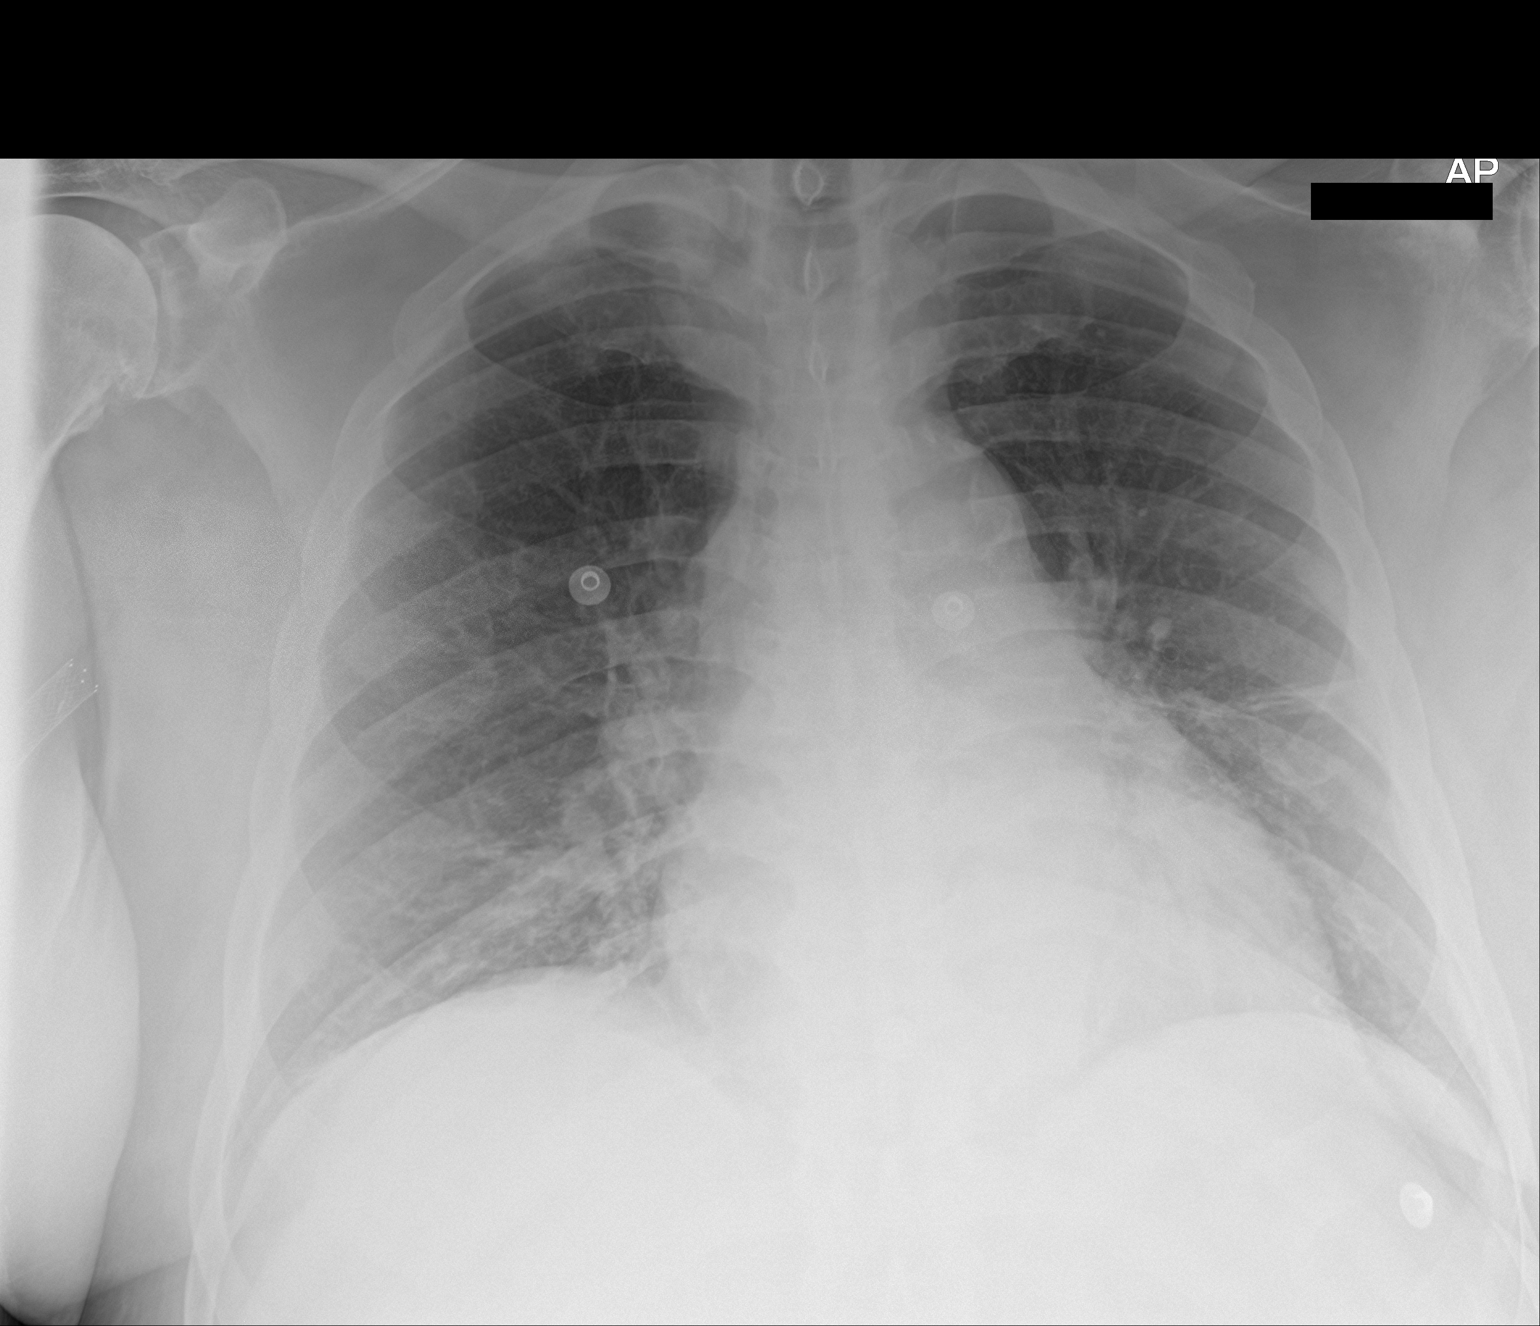

[chest lat]
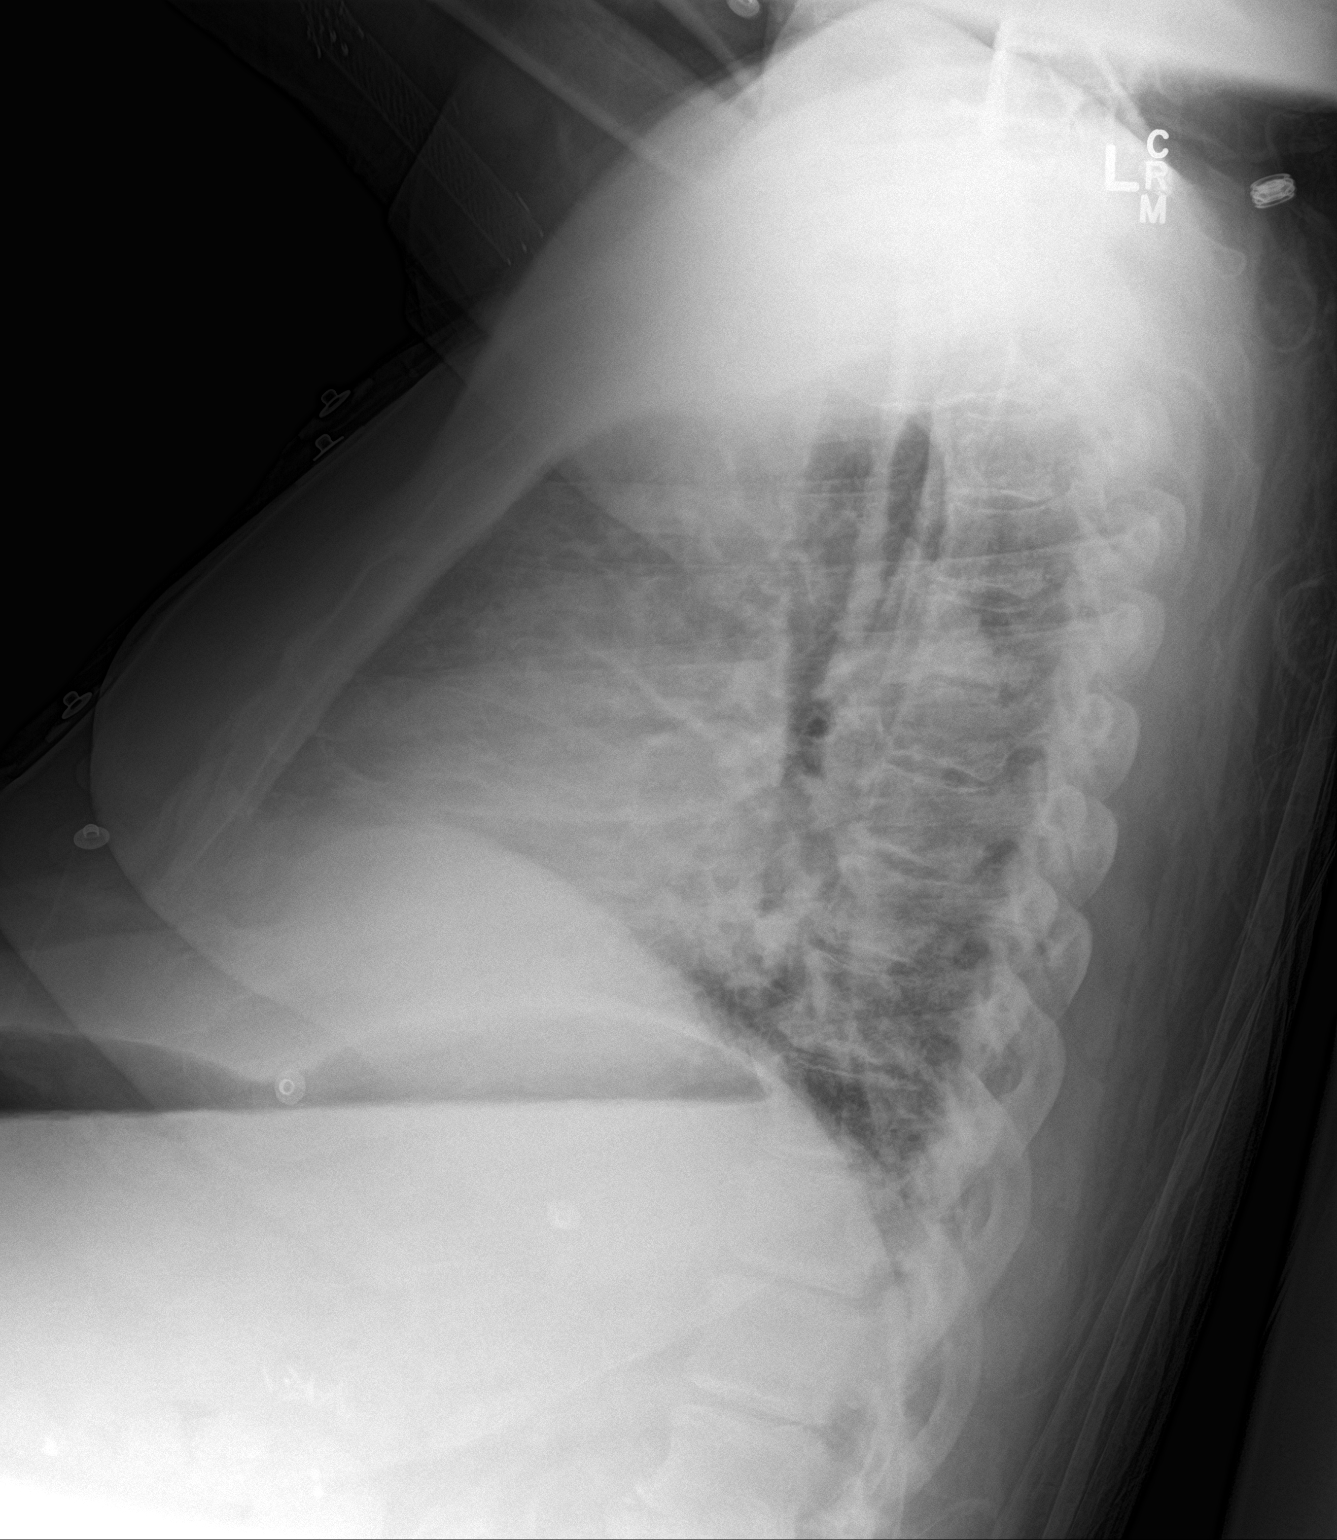

[2 of 2 positions shown; findings below may reference images not displayed]

FINDINGS: The heart is borderline enlarged but stable. The mediastinal and
hilar contours are within normal limits and unchanged. Mild
tortuosity of the thoracic aorta. The lungs demonstrate streaky
scarring changes but no infiltrates, edema or effusions. The bony
thorax is intact.
IMPRESSION: Chronic basilar scarring changes but no acute pulmonary findings.

## 2016-05-31 DIAGNOSIS — E119 Type 2 diabetes mellitus without complications: Secondary | ICD-10-CM | POA: Diagnosis not present

## 2016-05-31 DIAGNOSIS — N2581 Secondary hyperparathyroidism of renal origin: Secondary | ICD-10-CM | POA: Diagnosis not present

## 2016-05-31 DIAGNOSIS — N186 End stage renal disease: Secondary | ICD-10-CM | POA: Diagnosis not present

## 2016-05-31 DIAGNOSIS — D631 Anemia in chronic kidney disease: Secondary | ICD-10-CM | POA: Diagnosis not present

## 2016-05-31 DIAGNOSIS — E1129 Type 2 diabetes mellitus with other diabetic kidney complication: Secondary | ICD-10-CM | POA: Diagnosis not present

## 2016-05-31 DIAGNOSIS — Z992 Dependence on renal dialysis: Secondary | ICD-10-CM | POA: Diagnosis not present

## 2016-05-31 IMAGING — CT CT ANGIO CHEST
2 of 6 series · 18 of 36 positions shown · IV contrast (omnipaque)
Comparison: Chest x-ray yesterday.  Prior chest CT on 06/07/2010.

CLINICAL DATA: Knee replacement surgery on 08/02/2015. Chest pain
and hypoxia. History of end-stage renal disease, coronary artery
disease, diabetes and hypertension.

EXAM:
CT ANGIOGRAPHY CHEST WITH CONTRAST
TECHNIQUE: Multidetector CT imaging of the chest was performed using the
standard protocol during bolus administration of intravenous
contrast. Multiplanar CT image reconstructions and MIPs were
obtained to evaluate the vascular anatomy.
CONTRAST:  80mL OMNIPAQUE IOHEXOL 350 MG/ML SOLN

[Series 6: pe thins · axial · 0.67mm/px · z∈[-824,-568]mm · 17 of 569 slices shown]
[im 28/569  lung]
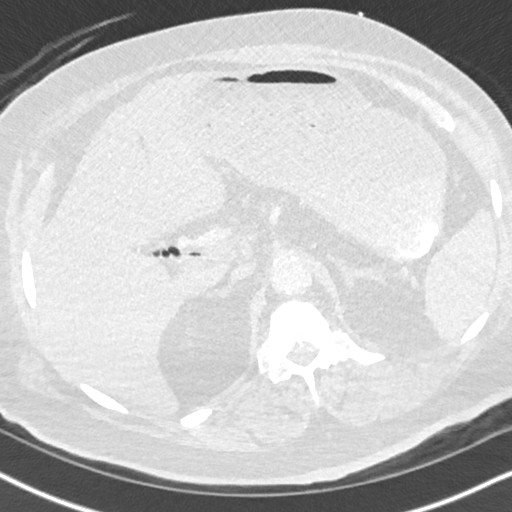
[im 55/569  mediastinal]
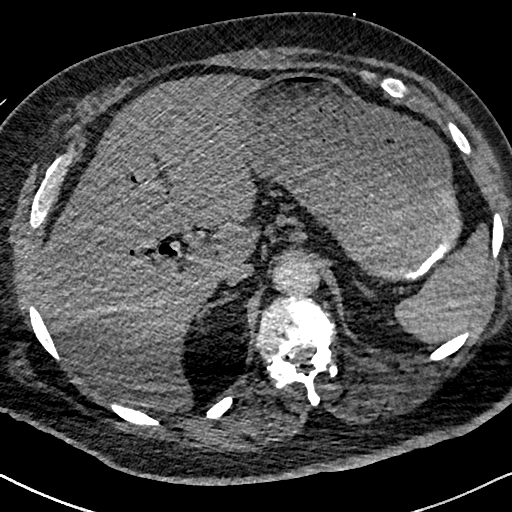
[im 82/569  lung]
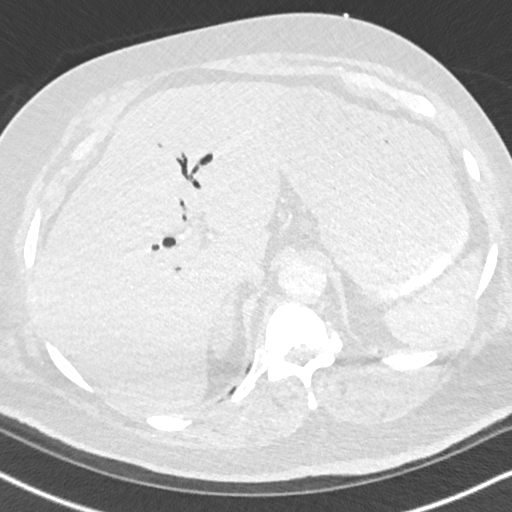
[im 136/569  mediastinal]
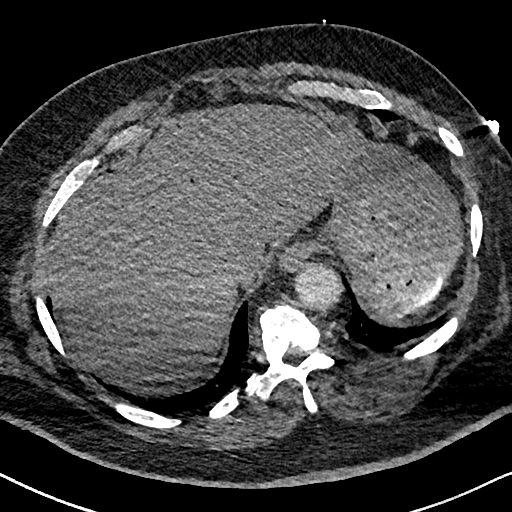
[im 163/569  lung]
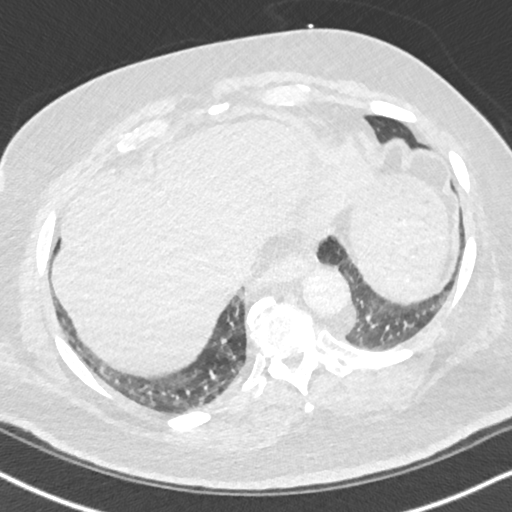
[im 190/569  mediastinal]
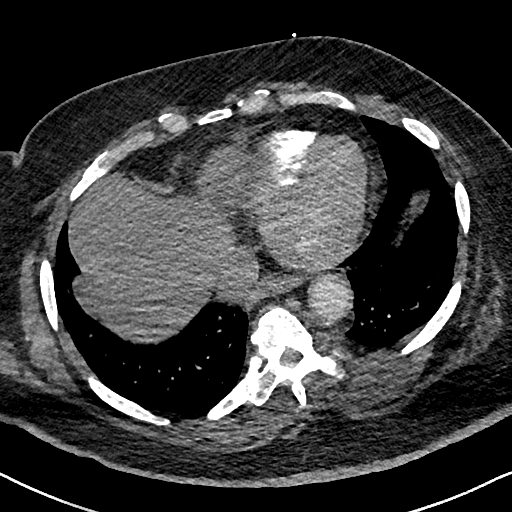
[im 217/569  lung]
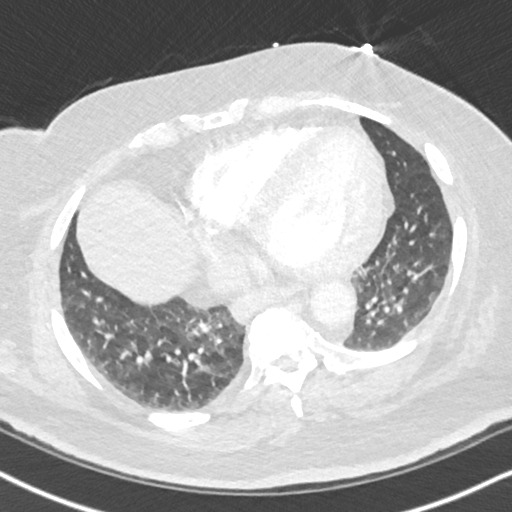
[im 244/569  mediastinal]
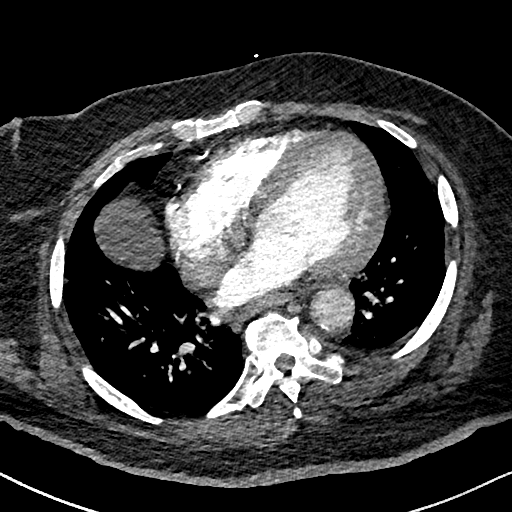
[im 298/569  lung]
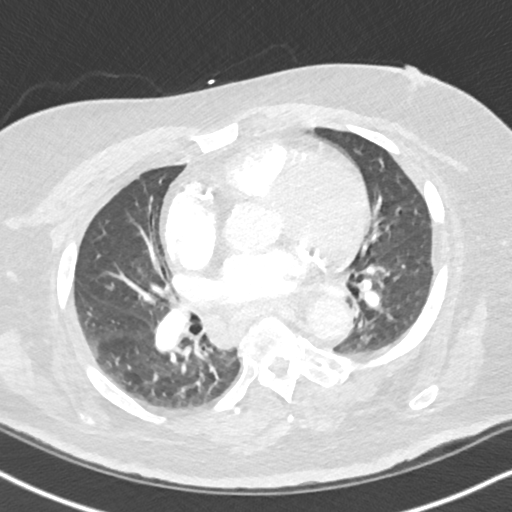
[im 325/569  mediastinal]
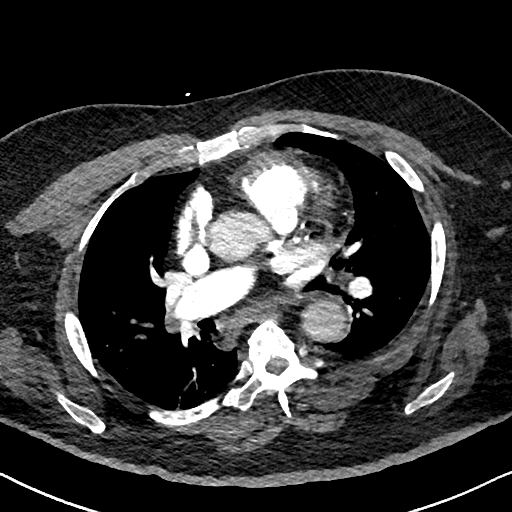
[im 352/569  lung]
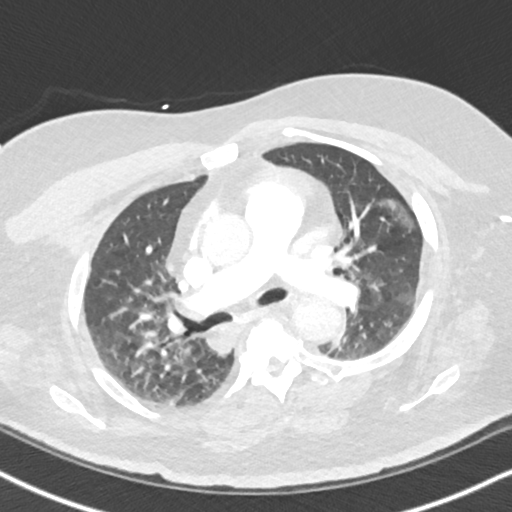
[im 379/569  mediastinal]
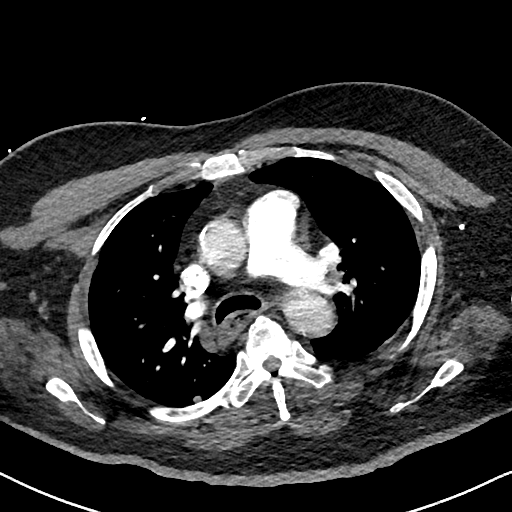
[im 406/569  lung]
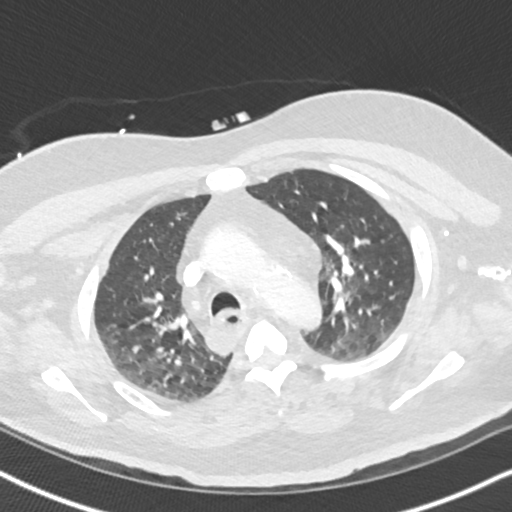
[im 433/569  mediastinal]
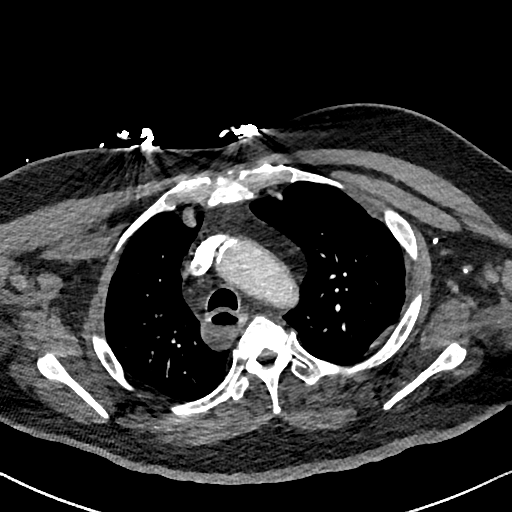
[im 487/569  lung]
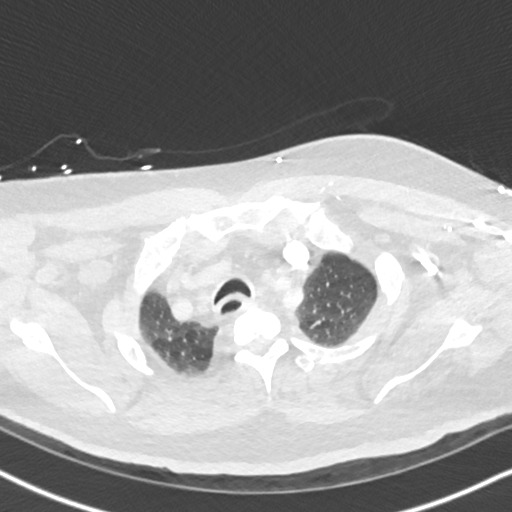
[im 514/569  mediastinal]
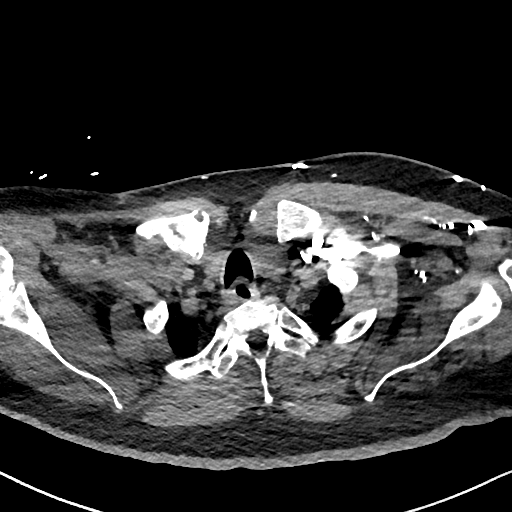
[im 541/569  lung]
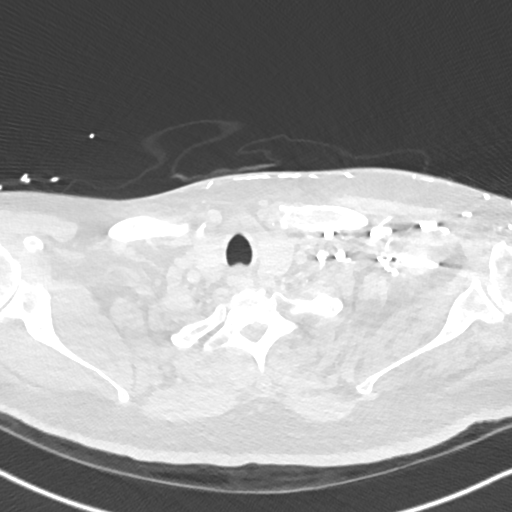

[Series 7: pe 2mm cor · coronal · 0.58mm/px · 1 of 126 slices shown]
[im 63/126  mediastinal]
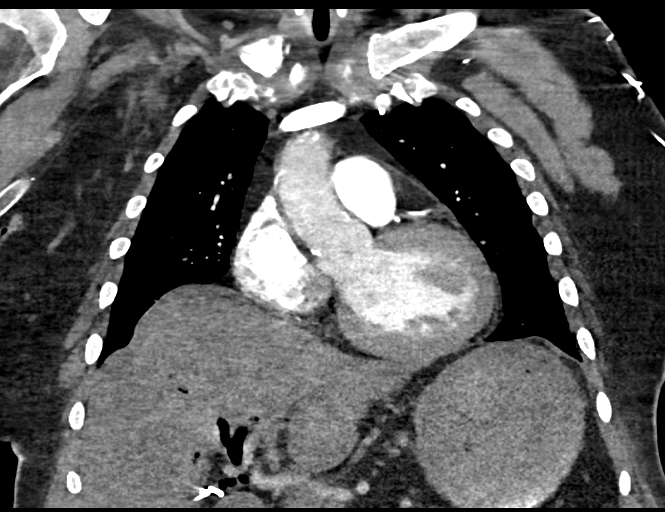

[18 of 36 positions shown; findings below may reference images not displayed]

FINDINGS: The pulmonary arteries are fairly well opacified and there is no
evidence of pulmonary embolism. The thoracic aorta shows no evidence
of aneurysmal disease. The heart is moderately enlarged. Heavily
calcified plaque is present throughout the coronary tree in a 3
vessel distribution, including the left main coronary artery. No
pleural or pericardial fluid is identified.

Lungs show pulmonary venous hypertensive changes and possible early
interstitial edema. There is a well-circumscribed noncalcified
nodule in the posterior subpleural right upper lobe measuring 6 x 8
mm. This was not present on the prior chest CT in 0600. No other
pulmonary nodules are identified. No enlarged lymph nodes are seen.
The esophagus contains some fluid and is tortuous.

Visualized upper abdomen shows air throughout a nondilated biliary
tree consistent with prior sphincterotomy and reflux of air. Bony
structures show spondylosis of the thoracic spine.

Review of the MIP images confirms the above findings.
IMPRESSION: 1. No evidence of pulmonary embolism.
2. Pulmonary venous hypertensive changes and possible early
interstitial edema.
3. 6 x 8 mm posterior right upper lobe nodule. This appears new
since 0600. If the patient is at high risk for bronchogenic
carcinoma, follow-up chest CT at 3-6 months is recommended. If the
patient is at low risk for bronchogenic carcinoma, follow-up chest
CT at 6-12 months is recommended. This recommendation follows the
consensus statement: Guidelines for Management of Small Pulmonary
Nodules Detected on CT Scans: A Statement from the [employer]
4. Coronary artery disease with heavily calcified coronary arteries
in a 3 vessel distribution.

## 2016-06-02 DIAGNOSIS — N186 End stage renal disease: Secondary | ICD-10-CM | POA: Diagnosis not present

## 2016-06-02 DIAGNOSIS — Z23 Encounter for immunization: Secondary | ICD-10-CM | POA: Diagnosis not present

## 2016-06-02 DIAGNOSIS — N2581 Secondary hyperparathyroidism of renal origin: Secondary | ICD-10-CM | POA: Diagnosis not present

## 2016-06-02 DIAGNOSIS — E119 Type 2 diabetes mellitus without complications: Secondary | ICD-10-CM | POA: Diagnosis not present

## 2016-06-02 DIAGNOSIS — D631 Anemia in chronic kidney disease: Secondary | ICD-10-CM | POA: Diagnosis not present

## 2016-06-05 DIAGNOSIS — Z23 Encounter for immunization: Secondary | ICD-10-CM | POA: Diagnosis not present

## 2016-06-05 DIAGNOSIS — N186 End stage renal disease: Secondary | ICD-10-CM | POA: Diagnosis not present

## 2016-06-05 DIAGNOSIS — N2581 Secondary hyperparathyroidism of renal origin: Secondary | ICD-10-CM | POA: Diagnosis not present

## 2016-06-05 DIAGNOSIS — D631 Anemia in chronic kidney disease: Secondary | ICD-10-CM | POA: Diagnosis not present

## 2016-06-05 DIAGNOSIS — E119 Type 2 diabetes mellitus without complications: Secondary | ICD-10-CM | POA: Diagnosis not present

## 2016-06-07 DIAGNOSIS — N186 End stage renal disease: Secondary | ICD-10-CM | POA: Diagnosis not present

## 2016-06-07 DIAGNOSIS — N2581 Secondary hyperparathyroidism of renal origin: Secondary | ICD-10-CM | POA: Diagnosis not present

## 2016-06-07 DIAGNOSIS — D631 Anemia in chronic kidney disease: Secondary | ICD-10-CM | POA: Diagnosis not present

## 2016-06-07 DIAGNOSIS — E119 Type 2 diabetes mellitus without complications: Secondary | ICD-10-CM | POA: Diagnosis not present

## 2016-06-07 DIAGNOSIS — Z23 Encounter for immunization: Secondary | ICD-10-CM | POA: Diagnosis not present

## 2016-06-08 ENCOUNTER — Other Ambulatory Visit: Payer: Self-pay | Admitting: Internal Medicine

## 2016-06-09 DIAGNOSIS — E119 Type 2 diabetes mellitus without complications: Secondary | ICD-10-CM | POA: Diagnosis not present

## 2016-06-09 DIAGNOSIS — Z23 Encounter for immunization: Secondary | ICD-10-CM | POA: Diagnosis not present

## 2016-06-09 DIAGNOSIS — N2581 Secondary hyperparathyroidism of renal origin: Secondary | ICD-10-CM | POA: Diagnosis not present

## 2016-06-09 DIAGNOSIS — D631 Anemia in chronic kidney disease: Secondary | ICD-10-CM | POA: Diagnosis not present

## 2016-06-09 DIAGNOSIS — N186 End stage renal disease: Secondary | ICD-10-CM | POA: Diagnosis not present

## 2016-06-12 DIAGNOSIS — N186 End stage renal disease: Secondary | ICD-10-CM | POA: Diagnosis not present

## 2016-06-12 DIAGNOSIS — D631 Anemia in chronic kidney disease: Secondary | ICD-10-CM | POA: Diagnosis not present

## 2016-06-12 DIAGNOSIS — N2581 Secondary hyperparathyroidism of renal origin: Secondary | ICD-10-CM | POA: Diagnosis not present

## 2016-06-12 DIAGNOSIS — Z23 Encounter for immunization: Secondary | ICD-10-CM | POA: Diagnosis not present

## 2016-06-12 DIAGNOSIS — E119 Type 2 diabetes mellitus without complications: Secondary | ICD-10-CM | POA: Diagnosis not present

## 2016-06-14 DIAGNOSIS — E119 Type 2 diabetes mellitus without complications: Secondary | ICD-10-CM | POA: Diagnosis not present

## 2016-06-14 DIAGNOSIS — Z23 Encounter for immunization: Secondary | ICD-10-CM | POA: Diagnosis not present

## 2016-06-14 DIAGNOSIS — I959 Hypotension, unspecified: Secondary | ICD-10-CM | POA: Diagnosis not present

## 2016-06-14 DIAGNOSIS — N2581 Secondary hyperparathyroidism of renal origin: Secondary | ICD-10-CM | POA: Diagnosis not present

## 2016-06-14 DIAGNOSIS — N186 End stage renal disease: Secondary | ICD-10-CM | POA: Diagnosis not present

## 2016-06-14 DIAGNOSIS — D631 Anemia in chronic kidney disease: Secondary | ICD-10-CM | POA: Diagnosis not present

## 2016-06-16 DIAGNOSIS — Z23 Encounter for immunization: Secondary | ICD-10-CM | POA: Diagnosis not present

## 2016-06-16 DIAGNOSIS — D631 Anemia in chronic kidney disease: Secondary | ICD-10-CM | POA: Diagnosis not present

## 2016-06-16 DIAGNOSIS — N186 End stage renal disease: Secondary | ICD-10-CM | POA: Diagnosis not present

## 2016-06-16 DIAGNOSIS — N2581 Secondary hyperparathyroidism of renal origin: Secondary | ICD-10-CM | POA: Diagnosis not present

## 2016-06-16 DIAGNOSIS — E119 Type 2 diabetes mellitus without complications: Secondary | ICD-10-CM | POA: Diagnosis not present

## 2016-06-18 ENCOUNTER — Ambulatory Visit (INDEPENDENT_AMBULATORY_CARE_PROVIDER_SITE_OTHER)
Admission: RE | Admit: 2016-06-18 | Discharge: 2016-06-18 | Disposition: A | Discharge status: Home / Self Care | Payer: Medicare Other | Source: Ambulatory Visit | Attending: Endocrinology | Admitting: Endocrinology

## 2016-06-18 ENCOUNTER — Encounter: Payer: Self-pay | Admitting: Endocrinology

## 2016-06-18 ENCOUNTER — Other Ambulatory Visit (INDEPENDENT_AMBULATORY_CARE_PROVIDER_SITE_OTHER): Payer: Medicare Other

## 2016-06-18 ENCOUNTER — Ambulatory Visit (INDEPENDENT_AMBULATORY_CARE_PROVIDER_SITE_OTHER): Payer: Medicare Other | Admitting: Endocrinology

## 2016-06-18 VITALS — BP 136/84 | HR 105 | Ht 69.0 in | Wt 233.0 lb

## 2016-06-18 DIAGNOSIS — I2510 Atherosclerotic heart disease of native coronary artery without angina pectoris: Secondary | ICD-10-CM

## 2016-06-18 DIAGNOSIS — R05 Cough: Secondary | ICD-10-CM | POA: Diagnosis not present

## 2016-06-18 DIAGNOSIS — N184 Chronic kidney disease, stage 4 (severe): Secondary | ICD-10-CM

## 2016-06-18 DIAGNOSIS — R059 Cough, unspecified: Secondary | ICD-10-CM

## 2016-06-18 DIAGNOSIS — E0590 Thyrotoxicosis, unspecified without thyrotoxic crisis or storm: Secondary | ICD-10-CM

## 2016-06-18 DIAGNOSIS — I739 Peripheral vascular disease, unspecified: Secondary | ICD-10-CM

## 2016-06-18 DIAGNOSIS — E1122 Type 2 diabetes mellitus with diabetic chronic kidney disease: Secondary | ICD-10-CM

## 2016-06-18 LAB — HEMOGLOBIN A1C: HEMOGLOBIN A1C: 7.3 % — AB (ref 4.6–6.5)

## 2016-06-18 NOTE — Patient Instructions (Addendum)
blood tests and a chest x-ray, are requested for you today.  We'll let you know about the results. Let's check the circulation.  you will receive a phone call, about a day and time for an appointment check your blood sugar once a day.  vary the time of day when you check, between before the 3 meals, and at bedtime.  also check if you have symptoms of your blood sugar being too high or too low.  please keep a record of the readings and bring it to your next appointment here (or you can bring the meter itself).  You can write it on any piece of paper.  please call us sooner if your blood sugar goes below 70, or if you have a lot of readings over 200. Please come back for a follow-up appointment in 4 months.

## 2016-06-18 NOTE — Progress Notes (Signed)
Subjective:    Patient ID: [name], male    DOB: 1951/06/08, 65 y.o.   MRN: [id number]  HPI The state of at least three ongoing medical problems is addressed today: Pt returns for f/u of diabetes mellitus:  DM type: 2 Dx'ed: 4665 Complications: polyneuropathy, renal failure, and CAD.  Therapy: welchol DKA: never.  Severe hypoglycemia: never.  Pancreatitis: never.  Other: med needs has decreased with worsening of renal function.   Interval history:  no cbg record, but states cbg's vary from 90-120.  There is no trend throughout the day.  He denies hypoglycemia.   Hyperthyroidism (due to a multinodular goiter; dx'ed 2009; bx then was benign; nuc med scan showed very low uptake, so tapazole was chosen as rx).  He has not recently taken tapazole.   Pt states few days of prod-quality cough in the chest, and assoc nasal congestion.  Past Medical History:  Diagnosis Date  . Allergic rhinitis, cause unspecified 02/24/2014  . Anemia 06/16/2011  . BENIGN PROSTATIC HYPERTROPHY 10/14/2009  . CAD, NATIVE VESSEL 02/06/2009   saw Dr. Missy Sabins last jan 2013  . Cervical radiculopathy, chronic 02/23/2016   Right c5-6 by NCS/EMG  . CHEST PAIN 03/29/2010  . COLONIC POLYPS, HX OF 10/14/2009  . CONGESTIVE HEART FAILURE 06/18/2007  . Dementia [id number]  . DEPRESSION 10/14/2009  . Depression 09/24/2015  . DIABETES MELLITUS, TYPE II 02/01/2010  . DIASTOLIC HEART FAILURE, CHRONIC 02/06/2009  . DIZZINESS 07/17/2010  . DYSLIPIDEMIA 06/18/2007  . DYSPNEA 10/29/2008  . ESRD (end stage renal disease) on dialysis ([place]) 08/04/2010   "TTS;  " (04/18/2015)  . FOOT PAIN 08/12/2008  . GAIT DISTURBANCE 03/03/2010  . GASTROENTERITIS, VIRAL 10/14/2009  . GERD 06/18/2007  . GOITER, MULTINODULAR 12/26/2007  . GOUT 06/18/2007  . GYNECOMASTIA 07/17/2010  . Hemodialysis access, fistula mature [facility])    Dialysis T-Th-Sa ([place]) Right upper arm fistula  . Hyperlipidemia 10/16/2011  . Hyperparathyroidism,  secondary ([place]) 06/16/2011  . HYPERTENSION 06/18/2007  . Hyperthyroidism   . HYPERTHYROIDISM 02/02/2010  . Hypocalcemia 06/07/2010  . Ischemic cardiomyopathy 06/16/2011  . NECK PAIN 07/31/2010  . ONYCHOMYCOSIS, TOENAILS 12/26/2007  . OSA on CPAP 10/16/2011  . Other malaise and fatigue 11/24/2009  . PERIPHERAL NEUROPATHY 06/18/2007  . Prostate cancer ([place])   . PULMONARY NODULE, RIGHT LOWER LOBE 06/08/2009  . Renal insufficiency   . Sleep apnea    cpap machine and o2  . TRANSAMINASES, SERUM, ELEVATED 02/01/2010  . Transfusion history    none recent  . Unspecified hypotension 01/30/2010    Past Surgical History:  Procedure Laterality Date  . ARTERIOVENOUS GRAFT PLACEMENT Right 2009   forearm/notes 02/01/2011  . AV FISTULA PLACEMENT  11/07/2011   Procedure: INSERTION OF ARTERIOVENOUS (AV) GORE-TEX GRAFT ARM;  Surgeon: Tinnie Gens, MD;  Location: [place];  Service: Vascular;  Laterality: Left;  . BACK SURGERY  1998  . BASCILIC VEIN TRANSPOSITION Right 02/27/2013   Procedure: BASCILIC VEIN TRANSPOSITION;  Surgeon: Mal Misty, MD;  Location: [place];  Service: Vascular;  Laterality: Right;  Right Basilic Vein Transposition   . CERVICAL SPINE SURGERY  2/09   "to repair nerve problems in my left arm"  . CHOLECYSTECTOMY    . CORONARY ANGIOPLASTY WITH STENT PLACEMENT  06/11/2008  . CORONARY ANGIOPLASTY WITH STENT PLACEMENT  06/2007   TAXUS stent to RCA/notes 01/31/2011  . ESOPHAGOGASTRODUODENOSCOPY  09/28/2011   Procedure: ESOPHAGOGASTRODUODENOSCOPY (EGD);  Surgeon: Missy Sabins, MD;  Location: [place];  Service: Endoscopy;  Laterality: N/A;  . ESOPHAGOGASTRODUODENOSCOPY N/A 04/07/2015   Procedure: ESOPHAGOGASTRODUODENOSCOPY (EGD);  Surgeon: Teena Irani, MD;  Location: [facility];  Service: Endoscopy;  Laterality: N/A;  . ESOPHAGOGASTRODUODENOSCOPY N/A 04/19/2015   Procedure: ESOPHAGOGASTRODUODENOSCOPY (EGD);  Surgeon: Arta Silence, MD;  Location: [facility] ENDOSCOPY;  Service: Endoscopy;  Laterality: N/A;  .  FOREIGN BODY REMOVAL  09/2003   via upper endoscopy/notes 02/12/2011  . GIVENS CAPSULE STUDY  09/30/2011   Procedure: GIVENS CAPSULE STUDY;  Surgeon: Jeryl Columbia, MD;  Location: [facility] ENDOSCOPY;  Service: Endoscopy;  Laterality: N/A;  . INSERTION OF DIALYSIS CATHETER Right 2014  . INSERTION OF DIALYSIS CATHETER Left 02/11/2013   Procedure: INSERTION OF DIALYSIS CATHETER;  Surgeon: Conrad Ramona, MD;  Location: [place];  Service: Vascular;  Laterality: Left;  Ultrasound guided  . REMOVAL OF A DIALYSIS CATHETER Right 02/11/2013   Procedure: REMOVAL OF A DIALYSIS CATHETER;  Surgeon: Conrad Hazleton, MD;  Location: [place];  Service: Vascular;  Laterality: Right;  . SAVORY DILATION N/A 04/07/2015   Procedure: SAVORY DILATION;  Surgeon: Teena Irani, MD;  Location: [facility] ENDOSCOPY;  Service: Endoscopy;  Laterality: N/A;  . SHUNTOGRAM N/A 09/20/2011   Procedure: Earney Mallet;  Surgeon: Conrad , MD;  Location: [facility] CATH LAB;  Service: Cardiovascular;  Laterality: N/A;  . TONSILLECTOMY    . TOTAL KNEE ARTHROPLASTY Right 08/02/2015   Procedure: TOTAL KNEE ARTHROPLASTY;  Surgeon: Renette Butters, MD;  Location: [place];  Service: Orthopedics;  Laterality: Right;  . VENOGRAM N/A 01/26/2013   Procedure: VENOGRAM;  Surgeon: Angelia Mould, MD;  Location: [facility] CATH LAB;  Service: Cardiovascular;  Laterality: N/A;    Social History   Social History  . Marital status: Married    Spouse name: N/A  . Number of children: 3  . Years of education: N/A   Occupational History  . 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    Social History Main Topics  . Smoking status: Former Smoker    Packs/day: 1.00    Years: 25.00    Types: Cigarettes    Quit date: 10/01/2005  . Smokeless tobacco: Former Systems developer    Quit date: 10/01/2005     Comment: Quit smoking 2007 Smoked x 25 years 1/2 ppd.  . Alcohol use No  . Drug use: No  . Sexual activity: No   Other Topics Concern  . Not on file   Social History  Narrative  . No narrative on file    Current Outpatient Prescriptions on File Prior to Visit  Medication Sig Dispense Refill  . acetaminophen (TYLENOL) 325 MG tablet Take 1 tablet (325 mg total) by mouth every 4 (four) hours as needed for headache or mild pain.    [name] allopurinol (ZYLOPRIM) 100 MG tablet TAKE ONE TABLET BY MOUTH ONCE DAILY 90 tablet 1  . aspirin 81 MG tablet Take 81 mg by mouth daily.    . cinacalcet (SENSIPAR) 60 MG tablet Take 60 mg by mouth 2 (two) times daily.     . citalopram (CELEXA) 10 MG tablet Take 1 tablet (10 mg total) by mouth daily. 90 tablet 3  . colesevelam (WELCHOL) 625 MG tablet Take 3 tablets (1,875 mg total) by mouth 2 (two) times daily with a meal. 180 tablet 11  . ezetimibe (ZETIA) 10 MG tablet Take 1 tablet (10 mg total) by mouth daily. 90 tablet 3  . gabapentin (NEURONTIN) 300 MG capsule 1-2 tabs by mouth at bedtime for  sleep and pain 180 capsule 3  . lanthanum (FOSRENOL) 1000 MG chewable tablet Chew 1,000 mg by mouth 2 (two) times daily with a meal. Reported on 12/23/2015    . midodrine (PROAMATINE) 2.5 MG tablet Take 1 tablet (2.5 mg total) by mouth 2 (two) times daily with a meal. ONLY ON HD DAY OR FOR BP LESS THAN 90 15 tablet 3  . multivitamin (RENA-VIT) TABS tablet Take 1 tablet by mouth daily. Reported on 10/24/2015     No current facility-administered medications on file prior to visit.     Allergies  Allergen Reactions  . Cephalexin Swelling and Other (See Comments)    Tongue swelling  . Statins Other (See Comments)    Weak muscles  . Ciprofloxacin Rash    Family History  Problem Relation Age of Onset  . Heart disease Father   . Diabetes Father   . Kidney failure Father   . Hypertension Father   . Heart disease Sister   . Cancer Neg Hx     BP 136/84   Pulse (!) 105   Ht 5\' 9"  (1.753 m)   Wt 233 lb (105.7 kg)   SpO2 95%   BMI 34.41 kg/m    Review of Systems He has fatigue.  Denies fever and sob.      Objective:    Physical Exam VITAL SIGNS:  See vs page GENERAL: no distress head: no deformity  eyes: no periorbital swelling, no proptosis  external nose and ears are normal  mouth: no lesion seen Both eac's and tm's are normal LUNGS:  Clear to auscultation Pulses: dorsalis pedis intact bilat.  MSK: no deformity of the feet CV: no leg edema.   Skin: no ulcer on the feet. normal color and temp on the feet.  Neuro: sensation is intact to touch on the feet, but decreased from normal.    Lab Results  Component Value Date   HGBA1C 7.3 (H) 06/18/2016   Lab Results  Component Value Date   CREATININE 6.44 (H) 09/10/2015   BUN 16 09/10/2015   NA 142 09/10/2015   K 3.6 09/10/2015   CL 101 09/10/2015   CO2 31 09/10/2015  CXR: NAD    Assessment & Plan:  Acute bronchitis, new Paresthesias, new.  Pt requests to check circulation Type 2 DM: he needs increased rx Renal failure: this limits DM oral rx options.

## 2016-06-19 DIAGNOSIS — N2581 Secondary hyperparathyroidism of renal origin: Secondary | ICD-10-CM | POA: Diagnosis not present

## 2016-06-19 DIAGNOSIS — N186 End stage renal disease: Secondary | ICD-10-CM | POA: Diagnosis not present

## 2016-06-19 DIAGNOSIS — Z23 Encounter for immunization: Secondary | ICD-10-CM | POA: Diagnosis not present

## 2016-06-19 DIAGNOSIS — D631 Anemia in chronic kidney disease: Secondary | ICD-10-CM | POA: Diagnosis not present

## 2016-06-19 DIAGNOSIS — E119 Type 2 diabetes mellitus without complications: Secondary | ICD-10-CM | POA: Diagnosis not present

## 2016-06-19 LAB — T4, FREE: Free T4: 0.85 ng/dL (ref 0.60–1.60)

## 2016-06-19 LAB — TSH: TSH: 0.24 u[IU]/mL — AB (ref 0.35–4.50)

## 2016-06-19 MED ORDER — REPAGLINIDE 0.5 MG PO TABS: 0.5000 mg | ORAL_TABLET | Freq: Every day | ORAL | 11 refills | Status: DC

## 2016-06-21 DIAGNOSIS — N186 End stage renal disease: Secondary | ICD-10-CM | POA: Diagnosis not present

## 2016-06-21 DIAGNOSIS — D631 Anemia in chronic kidney disease: Secondary | ICD-10-CM | POA: Diagnosis not present

## 2016-06-21 DIAGNOSIS — Z23 Encounter for immunization: Secondary | ICD-10-CM | POA: Diagnosis not present

## 2016-06-21 DIAGNOSIS — E119 Type 2 diabetes mellitus without complications: Secondary | ICD-10-CM | POA: Diagnosis not present

## 2016-06-21 DIAGNOSIS — N2581 Secondary hyperparathyroidism of renal origin: Secondary | ICD-10-CM | POA: Diagnosis not present

## 2016-06-23 DIAGNOSIS — E119 Type 2 diabetes mellitus without complications: Secondary | ICD-10-CM | POA: Diagnosis not present

## 2016-06-23 DIAGNOSIS — D631 Anemia in chronic kidney disease: Secondary | ICD-10-CM | POA: Diagnosis not present

## 2016-06-23 DIAGNOSIS — Z23 Encounter for immunization: Secondary | ICD-10-CM | POA: Diagnosis not present

## 2016-06-23 DIAGNOSIS — N186 End stage renal disease: Secondary | ICD-10-CM | POA: Diagnosis not present

## 2016-06-23 DIAGNOSIS — N2581 Secondary hyperparathyroidism of renal origin: Secondary | ICD-10-CM | POA: Diagnosis not present

## 2016-06-25 ENCOUNTER — Ambulatory Visit (INDEPENDENT_AMBULATORY_CARE_PROVIDER_SITE_OTHER): Payer: Medicare Other | Admitting: Neurology

## 2016-06-25 ENCOUNTER — Encounter: Payer: Self-pay | Admitting: Neurology

## 2016-06-25 VITALS — BP 140/68 | HR 103 | Temp 98.5°F | Ht 69.0 in | Wt 234.5 lb

## 2016-06-25 DIAGNOSIS — M5412 Radiculopathy, cervical region: Secondary | ICD-10-CM

## 2016-06-25 DIAGNOSIS — R531 Weakness: Secondary | ICD-10-CM

## 2016-06-25 DIAGNOSIS — I2583 Coronary atherosclerosis due to lipid rich plaque: Secondary | ICD-10-CM | POA: Diagnosis not present

## 2016-06-25 DIAGNOSIS — I2510 Atherosclerotic heart disease of native coronary artery without angina pectoris: Secondary | ICD-10-CM

## 2016-06-25 DIAGNOSIS — G629 Polyneuropathy, unspecified: Secondary | ICD-10-CM | POA: Diagnosis not present

## 2016-06-25 DIAGNOSIS — R413 Other amnesia: Secondary | ICD-10-CM

## 2016-06-25 DIAGNOSIS — M6281 Muscle weakness (generalized): Secondary | ICD-10-CM

## 2016-06-25 NOTE — Progress Notes (Signed)
NEUROLOGY FOLLOW UP OFFICE NOTE  HENNING EHLE [id number]  HISTORY OF PRESENT ILLNESS: I had the pleasure of seeing Jermie Hippe in follow-up in the neurology clinic on 06/25/2016.  He was seen 6 months ago for memory loss. Records and images were personally reviewed where available. I personally reviewed MRI brain without contrast which did not show any acute changes. There was mild cerebral atrophy and chronic microvascular disease. He was reporting left-sided weakness, EMG/NCV of the left upper and lower extremities showed a generalized sensorimotor polyneuropathy, severe in degree electrically. There was also a superimposed left C5-6 radiculopathy, moderate to severe. It was noted that multilevel lumbosacral radiculopathies affecting the L3-S1 nerve roots cannot be excluded.  Since his last visit, he reports memory is unchanged. He denies getting lost driving. He occasionally wonders if he already took his medication and would check his pillbox. He feels neck surgery did not help, his left side continues to feel weaker, his left arm does not feel it has the flexibility. There is no arm or neck pain, the back pain is better. He has numbness in the first 2 digits of his right hand.   HPI 12/21/2015: This is a 65 yo LH man with multiple medical issues, including hypertension, hyperlipidemia, diabetes, CKD on dialysis, neuropathy, prostate cancer currently on radiation therapy who presented for evaluation of memory changes. He denies any headaches. He reports his memory is not as good as it used to be, but "has been like this all my life." He has difficulty with name recall but remembers it 30-60 minutes after. When asked about misplacing things, he reports "my wife says I don't look for them, then they would be right in front of me." He and his wife would argue during the visit. She reports that memory change is "selective, sometimes he can remember what he wants to," then they get into arguments when  she tells him he already told her something. She started noticing memory changes for a few years now. She does most of the driving now, but he denied getting lost in the past. He manages his ow medications, but his wife has to remind him what they are for. His wife is in charge of bills. She reports he is "getting meaner." His wife is concerned he has MS due to the weakness he has been reporting. He reports generalized weakness, but worse in his left hand/arm. He has to struggle getting things from above his head. He has difficulty going up stairs. He has frequent falls, reporting 2-3 falls in the past 3 months. His wife reminds him he fell in the kitchen the other day. He becomes irritated and states he would just trip over something. They reports his knees just buckle out. He feels his left leg is weaker. He denies any neck pain, he has chronic back pain and "feels beaten up" in his lower back and legs. He denies any numbness/tingling, bowel/bladder dysfunction. He has frequent dizziness, mostly when getting up quickly. He goes for dialysis 3 times a week for the past 5-6 years. He started radiation for prostate cancer last 12/13/15. There is no family history of memory loss, he denies any head injuries or alcohol use.  PAST MEDICAL HISTORY: Past Medical History:  Diagnosis Date  . Allergic rhinitis, cause unspecified 02/24/2014  . Anemia 06/16/2011  . BENIGN PROSTATIC HYPERTROPHY 10/14/2009  . CAD, NATIVE VESSEL 02/06/2009   saw Dr. Missy Sabins last jan 2013  . Cervical radiculopathy, chronic 02/23/2016   Right  c5-6 by NCS/EMG  . CHEST PAIN 03/29/2010  . COLONIC POLYPS, HX OF 10/14/2009  . CONGESTIVE HEART FAILURE 06/18/2007  . Dementia [id number]  . DEPRESSION 10/14/2009  . Depression 09/24/2015  . DIABETES MELLITUS, TYPE II 02/01/2010  . DIASTOLIC HEART FAILURE, CHRONIC 02/06/2009  . DIZZINESS 07/17/2010  . DYSLIPIDEMIA 06/18/2007  . DYSPNEA 10/29/2008  . ESRD (end stage renal disease) on dialysis ([place])  08/04/2010   "TTS;  " (04/18/2015)  . FOOT PAIN 08/12/2008  . GAIT DISTURBANCE 03/03/2010  . GASTROENTERITIS, VIRAL 10/14/2009  . GERD 06/18/2007  . GOITER, MULTINODULAR 12/26/2007  . GOUT 06/18/2007  . GYNECOMASTIA 07/17/2010  . Hemodialysis access, fistula mature [facility])    Dialysis T-Th-Sa ([place]) Right upper arm fistula  . Hyperlipidemia 10/16/2011  . Hyperparathyroidism, secondary ([place]) 06/16/2011  . HYPERTENSION 06/18/2007  . Hyperthyroidism   . HYPERTHYROIDISM 02/02/2010  . Hypocalcemia 06/07/2010  . Ischemic cardiomyopathy 06/16/2011  . NECK PAIN 07/31/2010  . ONYCHOMYCOSIS, TOENAILS 12/26/2007  . OSA on CPAP 10/16/2011  . Other malaise and fatigue 11/24/2009  . PERIPHERAL NEUROPATHY 06/18/2007  . Prostate cancer ([place])   . PULMONARY NODULE, RIGHT LOWER LOBE 06/08/2009  . Renal insufficiency   . Sleep apnea    cpap machine and o2  . TRANSAMINASES, SERUM, ELEVATED 02/01/2010  . Transfusion history    none recent  . Unspecified hypotension 01/30/2010    MEDICATIONS: Current Outpatient Prescriptions on File Prior to Visit  Medication Sig Dispense Refill  . acetaminophen (TYLENOL) 325 MG tablet Take 1 tablet (325 mg total) by mouth every 4 (four) hours as needed for headache or mild pain.    [name] allopurinol (ZYLOPRIM) 100 MG tablet TAKE ONE TABLET BY MOUTH ONCE DAILY 90 tablet 1  . aspirin 81 MG tablet Take 81 mg by mouth daily.    . cinacalcet (SENSIPAR) 60 MG tablet Take 60 mg by mouth 2 (two) times daily.     . citalopram (CELEXA) 10 MG tablet Take 1 tablet (10 mg total) by mouth daily. 90 tablet 3  . colesevelam (WELCHOL) 625 MG tablet Take 3 tablets (1,875 mg total) by mouth 2 (two) times daily with a meal. 180 tablet 11  . ezetimibe (ZETIA) 10 MG tablet Take 1 tablet (10 mg total) by mouth daily. 90 tablet 3  . gabapentin (NEURONTIN) 300 MG capsule 1-2 tabs by mouth at bedtime for sleep and pain 180 capsule 3  . lanthanum (FOSRENOL) 1000 MG chewable tablet Chew 1,000 mg  by mouth 2 (two) times daily with a meal. Reported on 12/23/2015    . midodrine (PROAMATINE) 2.5 MG tablet Take 1 tablet (2.5 mg total) by mouth 2 (two) times daily with a meal. ONLY ON HD DAY OR FOR BP LESS THAN 90 15 tablet 3  . multivitamin (RENA-VIT) TABS tablet Take 1 tablet by mouth daily. Reported on 10/24/2015    . repaglinide (PRANDIN) 0.5 MG tablet Take 1 tablet (0.5 mg total) by mouth daily with breakfast. 30 tablet 11   No current facility-administered medications on file prior to visit.     ALLERGIES: Allergies  Allergen Reactions  . Cephalexin Swelling and Other (See Comments)    Tongue swelling  . Statins Other (See Comments)    Weak muscles  . Ciprofloxacin Rash    FAMILY HISTORY: Family History  Problem Relation Age of Onset  . Heart disease Father   . Diabetes Father   . Kidney failure Father   . Hypertension Father   .  Heart disease Sister   . Cancer Neg Hx     SOCIAL HISTORY: Social History   Social History  . Marital status: Married    Spouse name: N/A  . Number of children: 3  . Years of education: N/A   Occupational History  . 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    Social History Main Topics  . Smoking status: Former Smoker    Packs/day: 1.00    Years: 25.00    Types: Cigarettes    Quit date: 10/01/2005  . Smokeless tobacco: Former Systems developer    Quit date: 10/01/2005     Comment: Quit smoking 2007 Smoked x 25 years 1/2 ppd.  . Alcohol use No  . Drug use: No  . Sexual activity: No   Other Topics Concern  . Not on file   Social History Narrative  . No narrative on file    REVIEW OF SYSTEMS: Constitutional: No fevers, chills, or sweats, no generalized fatigue, change in appetite Eyes: No visual changes, double vision, eye pain Ear, nose and throat: No hearing loss, ear pain, nasal congestion, sore throat Cardiovascular: No chest pain, palpitations Respiratory:  No shortness of breath at rest or with exertion,  wheezes GastrointestinaI: No nausea, vomiting, diarrhea, abdominal pain, fecal incontinence Genitourinary:  No dysuria, urinary retention or frequency Musculoskeletal:  No neck pain, back pain Integumentary: No rash, pruritus, skin lesions Neurological: as above Psychiatric: No depression, insomnia, anxiety Endocrine: No palpitations, fatigue, diaphoresis, mood swings, change in appetite, change in weight, increased thirst Hematologic/Lymphatic:  No anemia, purpura, petechiae. Allergic/Immunologic: no itchy/runny eyes, nasal congestion, recent allergic reactions, rashes  PHYSICAL EXAM: Vitals:   06/25/16 1413  BP: 140/68  Pulse: (!) 103  Temp: 98.5 F (36.9 C)   General: No acute distress Head:  Normocephalic/atraumatic Neck: supple, no paraspinal tenderness, full range of motion Heart:  Regular rate and rhythm Lungs:  Clear to auscultation bilaterally Back: No paraspinal tenderness Skin/Extremities: No rash, no edema Neurological Exam: alert and oriented to person, place, and time. No aphasia or dysarthria. Fund of knowledge is appropriate.  Recent and remote memory are intact.  Attention and concentration are normal.    Able to name objects and repeat phrases. CDT 4/5 MMSE - Mini Mental State Exam 06/25/2016 12/21/2015  Orientation to time 4 5  Orientation to Place 5 5  Registration 3 3  Attention/ Calculation 4 5  Recall 2 0  Language- name 2 objects 2 2  Language- repeat 1 1  Language- follow 3 step command 3 3  Language- read & follow direction 1 1  Write a sentence 1 1  Copy design 1 1  Total score 27 27   Cranial nerves: Pupils equal, round, reactive to light.  Extraocular movements intact with no nystagmus. Visual fields full. Facial sensation intact. No facial asymmetry. Tongue, uvula, palate midline.  Motor: Bulk and tone normal, muscle strength 5/5 throughout except for mild 4+/5 left UE, no pronator drift.  Sensation to light touch, temperature and vibration intact.   No extinction to double simultaneous stimulation.  Deep tendon reflexes +1 throughout except for absent ankle jerks bilaterally, toes downgoing.  Finger to nose testing intact.  Gait narrow-based and steady, able to tandem walk adequately.  Romberg negative. Negative Tinel's sign.  IMPRESSION: This is a 65 yo LH man with multiple medical issues, including hypertension, hyperlipidemia, diabetes, CKD on dialysis, neuropathy, prostate cancer, cervical spine surgery, who presented with memory changes and weakness. MMSE today is  again normal 27/30. Neurological exam shows evidence of a length-dependent neuropathy, like due to diabetes. EMG showed a severe generalized polyneuropathy, as well as superimposed left C5-6 radiculopathy. He reports numbness in his right hand and will start using a wrist splint. We discussed doing PT for the left-sided weakness likely due to radiculopathy, he declines at this time and agrees to call our office if symptoms worsen so referral to PT can be done. He He will follow-up in 6 months and knows to call for any changes.    Thank you for allowing me to participate in his care.  Please do not hesitate to call for any questions or concerns.  The duration of this appointment visit was 25 minutes of face-to-face time with the patient.  Greater than 50% of this time was spent in counseling, explanation of diagnosis, planning of further management, and coordination of care.   Ellouise Newer, M.D.   CC: Dr. [name]

## 2016-06-25 NOTE — Patient Instructions (Signed)
1. Start using wrist splint on right hand 2. If left-sided weakness continues to worsen, would recommend proceeding with physical therapy, call our office to schedule 3. Continue all your medications 4. Follow-up in 6 months, call for any changes

## 2016-06-26 DIAGNOSIS — N2581 Secondary hyperparathyroidism of renal origin: Secondary | ICD-10-CM | POA: Diagnosis not present

## 2016-06-26 DIAGNOSIS — N186 End stage renal disease: Secondary | ICD-10-CM | POA: Diagnosis not present

## 2016-06-26 DIAGNOSIS — D631 Anemia in chronic kidney disease: Secondary | ICD-10-CM | POA: Diagnosis not present

## 2016-06-26 DIAGNOSIS — E119 Type 2 diabetes mellitus without complications: Secondary | ICD-10-CM | POA: Diagnosis not present

## 2016-06-26 DIAGNOSIS — Z23 Encounter for immunization: Secondary | ICD-10-CM | POA: Diagnosis not present

## 2016-06-27 ENCOUNTER — Encounter: Payer: Self-pay | Admitting: Pulmonary Disease

## 2016-06-27 ENCOUNTER — Ambulatory Visit (INDEPENDENT_AMBULATORY_CARE_PROVIDER_SITE_OTHER): Payer: Medicare Other | Admitting: Pulmonary Disease

## 2016-06-27 VITALS — BP 122/74 | HR 106 | Ht 69.0 in | Wt 231.8 lb

## 2016-06-27 DIAGNOSIS — C61 Malignant neoplasm of prostate: Secondary | ICD-10-CM | POA: Diagnosis not present

## 2016-06-27 DIAGNOSIS — G4733 Obstructive sleep apnea (adult) (pediatric): Secondary | ICD-10-CM

## 2016-06-27 DIAGNOSIS — I2510 Atherosclerotic heart disease of native coronary artery without angina pectoris: Secondary | ICD-10-CM

## 2016-06-27 NOTE — Progress Notes (Signed)
Current Outpatient Prescriptions on File Prior to Visit  Medication Sig  . acetaminophen (TYLENOL) 325 MG tablet Take 1 tablet (325 mg total) by mouth every 4 (four) hours as needed for headache or mild pain.  [name] allopurinol (ZYLOPRIM) 100 MG tablet TAKE ONE TABLET BY MOUTH ONCE DAILY  . aspirin 81 MG tablet Take 81 mg by mouth daily.  . cinacalcet (SENSIPAR) 60 MG tablet Take 60 mg by mouth 2 (two) times daily.   . citalopram (CELEXA) 10 MG tablet Take 1 tablet (10 mg total) by mouth daily.  . colesevelam (WELCHOL) 625 MG tablet Take 3 tablets (1,875 mg total) by mouth 2 (two) times daily with a meal.  . ezetimibe (ZETIA) 10 MG tablet Take 1 tablet (10 mg total) by mouth daily.  [name] gabapentin (NEURONTIN) 300 MG capsule 1-2 tabs by mouth at bedtime for sleep and pain  . lanthanum (FOSRENOL) 1000 MG chewable tablet Chew 1,000 mg by mouth 2 (two) times daily with a meal. Reported on 12/23/2015  . midodrine (PROAMATINE) 2.5 MG tablet Take 1 tablet (2.5 mg total) by mouth 2 (two) times daily with a meal. ONLY ON HD DAY OR FOR BP LESS THAN 90  . multivitamin (RENA-VIT) TABS tablet Take 1 tablet by mouth daily. Reported on 10/24/2015  . repaglinide (PRANDIN) 0.5 MG tablet Take 1 tablet (0.5 mg total) by mouth daily with breakfast.   No current facility-administered medications on file prior to visit.      Chief Complaint  Patient presents with  . Follow-up    Pt. uses CPAP, with no compliants at the moment,Using it every night, DME [facility]    Sleep tests PSG 04/24/06 >> AHI 110.7, SpO2 low 81% Auto CPAP 05/27/16 to 06/25/16 >> used on 30 of 30 nights with average 9 hrs 42 min.  Average AHI 5 with median CPAP 13 and 95 th percentile CPAP 17 cm H2O  Pulmonary tests PFT 01/12/09 >> FEV1 2.53 (73%), FEV1% 81, TLC 4.99 (70%), DLCO 64%  Cardiac tests Echo 01/12/15 >> EF 50 to 55%, grade 1 DD  Past medical hx Allergies, BPH, CAD, Depression, Dementia, DM, Diastolic CHF, HLD, ESRD, GERD, HTN, Prostate  cancer, Peripheral neuropathy  Past surgical hx, Allergies, Family hx, Social hx all reviewed.  Vital Signs BP 122/74 (BP Location: Left Arm, Patient Position: Sitting, Cuff Size: Normal)   Pulse (!) 106   Ht 5\' 9"  (1.753 m)   Wt 231 lb 12.8 oz (105.1 kg)   SpO2 94%   BMI 34.23 kg/m   History of Present Illness [name] is a 65 y.o. male with obstructive sleep apnea.  He uses CPAP nightly.  This helps.  No issue with mask fit.  He notices more trouble falling asleep on days he doesn't have dialysis.  He will also have trouble falling asleep if he is worried about something.  Physical Exam  General - No distress ENT - No sinus tenderness, no oral exudate, no LAN, MP 4 Cardiac - s1s2 regular, no murmur Chest - No wheeze/rales/dullness Back - No focal tenderness Abd - Soft, non-tender Ext - No edema, AV graft Rt upper arm Neuro - Normal strength Skin - No rashes Psych - normal mood, and behavior   Assessment/Plan  Obstructive sleep apnea. - he is compliant with CPAP and reports benefit - continue auto CPAP  Sleep onset insomnia. - discussed stimulus control and relaxation techniques - explained how sleep pattern can fluctuate in relation to timing of his dialysis  sessions   Patient Instructions  Follow up in 1 year    Chesley Mires, MD [place] [facility] Pager:  [phone] 06/27/2016, 3:06 PM

## 2016-06-27 NOTE — Patient Instructions (Signed)
Follow up in 1 year.

## 2016-06-28 DIAGNOSIS — D631 Anemia in chronic kidney disease: Secondary | ICD-10-CM | POA: Diagnosis not present

## 2016-06-28 DIAGNOSIS — R5383 Other fatigue: Secondary | ICD-10-CM | POA: Diagnosis not present

## 2016-06-28 DIAGNOSIS — N186 End stage renal disease: Secondary | ICD-10-CM | POA: Diagnosis not present

## 2016-06-28 DIAGNOSIS — Z23 Encounter for immunization: Secondary | ICD-10-CM | POA: Diagnosis not present

## 2016-06-28 DIAGNOSIS — E119 Type 2 diabetes mellitus without complications: Secondary | ICD-10-CM | POA: Diagnosis not present

## 2016-06-28 DIAGNOSIS — N2581 Secondary hyperparathyroidism of renal origin: Secondary | ICD-10-CM | POA: Diagnosis not present

## 2016-06-29 DIAGNOSIS — L84 Corns and callosities: Secondary | ICD-10-CM | POA: Diagnosis not present

## 2016-06-29 DIAGNOSIS — L602 Onychogryphosis: Secondary | ICD-10-CM | POA: Diagnosis not present

## 2016-06-29 DIAGNOSIS — I70293 Other atherosclerosis of native arteries of extremities, bilateral legs: Secondary | ICD-10-CM | POA: Diagnosis not present

## 2016-06-29 DIAGNOSIS — E1351 Other specified diabetes mellitus with diabetic peripheral angiopathy without gangrene: Secondary | ICD-10-CM | POA: Diagnosis not present

## 2016-06-30 DIAGNOSIS — E1129 Type 2 diabetes mellitus with other diabetic kidney complication: Secondary | ICD-10-CM | POA: Diagnosis not present

## 2016-06-30 DIAGNOSIS — D631 Anemia in chronic kidney disease: Secondary | ICD-10-CM | POA: Diagnosis not present

## 2016-06-30 DIAGNOSIS — N186 End stage renal disease: Secondary | ICD-10-CM | POA: Diagnosis not present

## 2016-06-30 DIAGNOSIS — Z23 Encounter for immunization: Secondary | ICD-10-CM | POA: Diagnosis not present

## 2016-06-30 DIAGNOSIS — N2581 Secondary hyperparathyroidism of renal origin: Secondary | ICD-10-CM | POA: Diagnosis not present

## 2016-06-30 DIAGNOSIS — Z992 Dependence on renal dialysis: Secondary | ICD-10-CM | POA: Diagnosis not present

## 2016-06-30 DIAGNOSIS — E119 Type 2 diabetes mellitus without complications: Secondary | ICD-10-CM | POA: Diagnosis not present

## 2016-07-03 DIAGNOSIS — N186 End stage renal disease: Secondary | ICD-10-CM | POA: Diagnosis not present

## 2016-07-03 DIAGNOSIS — N2581 Secondary hyperparathyroidism of renal origin: Secondary | ICD-10-CM | POA: Diagnosis not present

## 2016-07-03 DIAGNOSIS — D631 Anemia in chronic kidney disease: Secondary | ICD-10-CM | POA: Diagnosis not present

## 2016-07-03 DIAGNOSIS — E119 Type 2 diabetes mellitus without complications: Secondary | ICD-10-CM | POA: Diagnosis not present

## 2016-07-05 DIAGNOSIS — N186 End stage renal disease: Secondary | ICD-10-CM | POA: Diagnosis not present

## 2016-07-05 DIAGNOSIS — E119 Type 2 diabetes mellitus without complications: Secondary | ICD-10-CM | POA: Diagnosis not present

## 2016-07-05 DIAGNOSIS — N2581 Secondary hyperparathyroidism of renal origin: Secondary | ICD-10-CM | POA: Diagnosis not present

## 2016-07-05 DIAGNOSIS — D631 Anemia in chronic kidney disease: Secondary | ICD-10-CM | POA: Diagnosis not present

## 2016-07-06 ENCOUNTER — Encounter: Payer: Self-pay | Admitting: Neurology

## 2016-07-06 DIAGNOSIS — C61 Malignant neoplasm of prostate: Secondary | ICD-10-CM | POA: Diagnosis not present

## 2016-07-07 DIAGNOSIS — E119 Type 2 diabetes mellitus without complications: Secondary | ICD-10-CM | POA: Diagnosis not present

## 2016-07-07 DIAGNOSIS — D631 Anemia in chronic kidney disease: Secondary | ICD-10-CM | POA: Diagnosis not present

## 2016-07-07 DIAGNOSIS — N186 End stage renal disease: Secondary | ICD-10-CM | POA: Diagnosis not present

## 2016-07-07 DIAGNOSIS — N2581 Secondary hyperparathyroidism of renal origin: Secondary | ICD-10-CM | POA: Diagnosis not present

## 2016-07-09 ENCOUNTER — Ambulatory Visit (HOSPITAL_COMMUNITY)
Admission: RE | Admit: 2016-07-09 | Discharge: 2016-07-09 | Disposition: A | Discharge status: Home / Self Care | Payer: Medicare Other | Source: Ambulatory Visit | Attending: Cardiovascular Disease | Admitting: Cardiovascular Disease

## 2016-07-09 ENCOUNTER — Other Ambulatory Visit: Payer: Self-pay | Admitting: Endocrinology

## 2016-07-09 DIAGNOSIS — I70203 Unspecified atherosclerosis of native arteries of extremities, bilateral legs: Secondary | ICD-10-CM | POA: Diagnosis not present

## 2016-07-09 DIAGNOSIS — E1151 Type 2 diabetes mellitus with diabetic peripheral angiopathy without gangrene: Secondary | ICD-10-CM | POA: Insufficient documentation

## 2016-07-09 DIAGNOSIS — I739 Peripheral vascular disease, unspecified: Secondary | ICD-10-CM

## 2016-07-09 DIAGNOSIS — Z87891 Personal history of nicotine dependence: Secondary | ICD-10-CM | POA: Diagnosis not present

## 2016-07-09 DIAGNOSIS — I10 Essential (primary) hypertension: Secondary | ICD-10-CM | POA: Insufficient documentation

## 2016-07-09 DIAGNOSIS — J449 Chronic obstructive pulmonary disease, unspecified: Secondary | ICD-10-CM | POA: Insufficient documentation

## 2016-07-09 DIAGNOSIS — I2510 Atherosclerotic heart disease of native coronary artery without angina pectoris: Secondary | ICD-10-CM | POA: Insufficient documentation

## 2016-07-09 DIAGNOSIS — E785 Hyperlipidemia, unspecified: Secondary | ICD-10-CM | POA: Insufficient documentation

## 2016-07-09 DIAGNOSIS — E1351 Other specified diabetes mellitus with diabetic peripheral angiopathy without gangrene: Secondary | ICD-10-CM | POA: Diagnosis not present

## 2016-07-10 DIAGNOSIS — N2581 Secondary hyperparathyroidism of renal origin: Secondary | ICD-10-CM | POA: Diagnosis not present

## 2016-07-10 DIAGNOSIS — D631 Anemia in chronic kidney disease: Secondary | ICD-10-CM | POA: Diagnosis not present

## 2016-07-10 DIAGNOSIS — E119 Type 2 diabetes mellitus without complications: Secondary | ICD-10-CM | POA: Diagnosis not present

## 2016-07-10 DIAGNOSIS — N186 End stage renal disease: Secondary | ICD-10-CM | POA: Diagnosis not present

## 2016-07-12 DIAGNOSIS — E119 Type 2 diabetes mellitus without complications: Secondary | ICD-10-CM | POA: Diagnosis not present

## 2016-07-12 DIAGNOSIS — N2581 Secondary hyperparathyroidism of renal origin: Secondary | ICD-10-CM | POA: Diagnosis not present

## 2016-07-12 DIAGNOSIS — N186 End stage renal disease: Secondary | ICD-10-CM | POA: Diagnosis not present

## 2016-07-12 DIAGNOSIS — D631 Anemia in chronic kidney disease: Secondary | ICD-10-CM | POA: Diagnosis not present

## 2016-07-14 DIAGNOSIS — N186 End stage renal disease: Secondary | ICD-10-CM | POA: Diagnosis not present

## 2016-07-14 DIAGNOSIS — E119 Type 2 diabetes mellitus without complications: Secondary | ICD-10-CM | POA: Diagnosis not present

## 2016-07-14 DIAGNOSIS — N2581 Secondary hyperparathyroidism of renal origin: Secondary | ICD-10-CM | POA: Diagnosis not present

## 2016-07-14 DIAGNOSIS — D631 Anemia in chronic kidney disease: Secondary | ICD-10-CM | POA: Diagnosis not present

## 2016-07-17 DIAGNOSIS — E119 Type 2 diabetes mellitus without complications: Secondary | ICD-10-CM | POA: Diagnosis not present

## 2016-07-17 DIAGNOSIS — N2581 Secondary hyperparathyroidism of renal origin: Secondary | ICD-10-CM | POA: Diagnosis not present

## 2016-07-17 DIAGNOSIS — D631 Anemia in chronic kidney disease: Secondary | ICD-10-CM | POA: Diagnosis not present

## 2016-07-17 DIAGNOSIS — N186 End stage renal disease: Secondary | ICD-10-CM | POA: Diagnosis not present

## 2016-07-18 DIAGNOSIS — M25562 Pain in left knee: Secondary | ICD-10-CM | POA: Diagnosis not present

## 2016-07-18 DIAGNOSIS — M1712 Unilateral primary osteoarthritis, left knee: Secondary | ICD-10-CM | POA: Diagnosis not present

## 2016-07-19 ENCOUNTER — Other Ambulatory Visit: Payer: Self-pay | Admitting: Internal Medicine

## 2016-07-19 DIAGNOSIS — N186 End stage renal disease: Secondary | ICD-10-CM | POA: Diagnosis not present

## 2016-07-19 DIAGNOSIS — N2581 Secondary hyperparathyroidism of renal origin: Secondary | ICD-10-CM | POA: Diagnosis not present

## 2016-07-19 DIAGNOSIS — D631 Anemia in chronic kidney disease: Secondary | ICD-10-CM | POA: Diagnosis not present

## 2016-07-19 DIAGNOSIS — E119 Type 2 diabetes mellitus without complications: Secondary | ICD-10-CM | POA: Diagnosis not present

## 2016-07-20 ENCOUNTER — Ambulatory Visit (INDEPENDENT_AMBULATORY_CARE_PROVIDER_SITE_OTHER): Payer: Medicare Other | Admitting: Internal Medicine

## 2016-07-20 ENCOUNTER — Telehealth: Payer: Self-pay

## 2016-07-20 ENCOUNTER — Encounter: Payer: Self-pay | Admitting: Internal Medicine

## 2016-07-20 ENCOUNTER — Telehealth: Payer: Self-pay | Admitting: Endocrinology

## 2016-07-20 VITALS — BP 138/78 | HR 74 | Temp 98.5°F | Resp 20 | Wt 232.0 lb

## 2016-07-20 DIAGNOSIS — F329 Major depressive disorder, single episode, unspecified: Secondary | ICD-10-CM | POA: Diagnosis not present

## 2016-07-20 DIAGNOSIS — F0390 Unspecified dementia without behavioral disturbance: Secondary | ICD-10-CM

## 2016-07-20 DIAGNOSIS — F32A Depression, unspecified: Secondary | ICD-10-CM

## 2016-07-20 DIAGNOSIS — I739 Peripheral vascular disease, unspecified: Secondary | ICD-10-CM

## 2016-07-20 DIAGNOSIS — I110 Hypertensive heart disease with heart failure: Secondary | ICD-10-CM | POA: Diagnosis not present

## 2016-07-20 DIAGNOSIS — I2510 Atherosclerotic heart disease of native coronary artery without angina pectoris: Secondary | ICD-10-CM | POA: Diagnosis not present

## 2016-07-20 DIAGNOSIS — I2583 Coronary atherosclerosis due to lipid rich plaque: Secondary | ICD-10-CM

## 2016-07-20 MED ORDER — ALLOPURINOL 100 MG PO TABS: 100.0000 mg | ORAL_TABLET | Freq: Every day | ORAL | 3 refills | Status: DC

## 2016-07-20 MED ORDER — GABAPENTIN 300 MG PO CAPS: ORAL_CAPSULE | ORAL | 3 refills | Status: DC

## 2016-07-20 MED ORDER — COLESEVELAM HCL 625 MG PO TABS: ORAL_TABLET | ORAL | 11 refills | Status: DC

## 2016-07-20 NOTE — Assessment & Plan Note (Signed)
stable overall by history and exam, and pt to continue medical treatment as before,  to f/u any worsening symptoms or concerns 

## 2016-07-20 NOTE — Telephone Encounter (Signed)
Pt wife called in and was wondering if the results from his leg ultrasound were back yet, and would like to know what is going on.

## 2016-07-20 NOTE — Telephone Encounter (Signed)
Called and tried to speak with patient, he did not understand me, but said to call back and speak with his wife when she got home from her doctor appointment. I will try and reach patient later, to discuss.

## 2016-07-20 NOTE — Progress Notes (Signed)
Pre visit review using our clinic review tool, if applicable. No additional management support is needed unless otherwise documented below in the visit note. 

## 2016-07-20 NOTE — Patient Instructions (Signed)
Please continue all other medications as before, and refills have been done if requested.  Please have the pharmacy call with any other refills you may need.  Please continue your efforts at being more active, low cholesterol diet, and weight control.  Please keep your appointments with your specialists as you may have planned  Please return in 6 months, or sooner if needed 

## 2016-07-20 NOTE — Telephone Encounter (Signed)
please call patient: My apologies, as I thought you already had results.  You have some blockage.  Please see a specialist for this.  you will receive a phone call, about a day and time for an appointment

## 2016-07-20 NOTE — Assessment & Plan Note (Signed)
stable overall by history and exam, recent data reviewed with pt, and pt to continue medical treatment as before,  to f/u any worsening symptoms or concerns Lab Results  Component Value Date   WBC 6.1 10/31/2015   HGB 11.6 (L) 10/31/2015   HCT 37.8 (L) 10/31/2015   PLT 325.0 10/31/2015   GLUCOSE 127 (H) 09/10/2015   CHOL 263 (H) 07/16/2014   TRIG (H) 07/16/2014    409.0 Triglyceride is over 400; calculations on Lipids are invalid.   HDL 27.90 (L) 07/16/2014   LDLDIRECT 136.0 07/16/2014   LDLCALC 185 (H) 01/13/2014   ALT 14 10/31/2015   AST 16 10/31/2015   NA 142 09/10/2015   K 3.6 09/10/2015   CL 101 09/10/2015   CREATININE 6.44 (H) 09/10/2015   BUN 16 09/10/2015   CO2 31 09/10/2015   TSH 0.24 (L) 06/18/2016   PSA 3.67 08/30/2014   INR 1.04 07/20/2015   HGBA1C 7.3 (H) 06/18/2016

## 2016-07-20 NOTE — Progress Notes (Signed)
Subjective:    Patient ID: [name], male    DOB: 1951/07/10, 65 y.o.   MRN: [id number]  HPI  Here to f/u; overall doing ok,  Pt denies chest pain, increasing sob or doe, wheezing, orthopnea, PND, increased LE swelling, palpitations, dizziness or syncope.  Pt denies new neurological symptoms such as new headache, or facial or extremity weakness or numbness.  Pt denies polydipsia, polyuria, or low sugar episode.   Pt denies new neurological symptoms such as new headache, or facial or extremity weakness or numbness.   Pt states overall good compliance with meds, mostly trying to follow appropriate diet, with wt overall stable,  but little exercise however. Walks with walker, has chronic GI upset, general weakness.   Missed appt with GI Dr Paulita Fujita 2 days ago for abd pain, due to flare of left knee pain and swelling/known djd and could not ambulate well. Seen per Dr Bosie Clos and now s/p left knee cortisone.  Is being referred to  [facility] ortho for further f/u left knee, likely needs TKR.  Needs several med refills today.  Recent a1c 7.1 per endo. Denies worsening depressive symptoms, suicidal ideation, or panic; has ongoing anxiety, not increased recently.  Dementia overall stable symptomatically, and not assoc with behavioral changes such as hallucinations, paranoia, or agitation.  Past Medical History:  Diagnosis Date  . Allergic rhinitis, cause unspecified 02/24/2014  . Anemia 06/16/2011  . BENIGN PROSTATIC HYPERTROPHY 10/14/2009  . CAD, NATIVE VESSEL 02/06/2009   saw Dr. Missy Sabins last jan 2013  . Cervical radiculopathy, chronic 02/23/2016   Right c5-6 by [facility]/EMG  . CHEST PAIN 03/29/2010  . COLONIC POLYPS, HX OF 10/14/2009  . CONGESTIVE HEART FAILURE 06/18/2007  . Dementia [id number]  . DEPRESSION 10/14/2009  . Depression 09/24/2015  . DIABETES MELLITUS, TYPE II 02/01/2010  . DIASTOLIC HEART FAILURE, CHRONIC 02/06/2009  . DIZZINESS 07/17/2010  . DYSLIPIDEMIA 06/18/2007  . DYSPNEA 10/29/2008  .  ESRD (end stage renal disease) on dialysis ([place]) 08/04/2010   "TTS;  " (04/18/2015)  . FOOT PAIN 08/12/2008  . GAIT DISTURBANCE 03/03/2010  . GASTROENTERITIS, VIRAL 10/14/2009  . GERD 06/18/2007  . GOITER, MULTINODULAR 12/26/2007  . GOUT 06/18/2007  . GYNECOMASTIA 07/17/2010  . Hemodialysis access, fistula mature [facility])    Dialysis [place] ([employer]) Right upper arm fistula  . Hyperlipidemia 10/16/2011  . Hyperparathyroidism, secondary ([place]) 06/16/2011  . HYPERTENSION 06/18/2007  . Hyperthyroidism   . HYPERTHYROIDISM 02/02/2010  . Hypocalcemia 06/07/2010  . Ischemic cardiomyopathy 06/16/2011  . NECK PAIN 07/31/2010  . ONYCHOMYCOSIS, TOENAILS 12/26/2007  . OSA on CPAP 10/16/2011  . Other malaise and fatigue 11/24/2009  . PERIPHERAL NEUROPATHY 06/18/2007  . Prostate cancer ([place])   . PULMONARY NODULE, RIGHT LOWER LOBE 06/08/2009  . Renal insufficiency   . Sleep apnea    cpap machine and o2  . TRANSAMINASES, SERUM, ELEVATED 02/01/2010  . Transfusion history    none recent  . Unspecified hypotension 01/30/2010   Past Surgical History:  Procedure Laterality Date  . ARTERIOVENOUS GRAFT PLACEMENT Right 2009   forearm/notes 02/01/2011  . AV FISTULA PLACEMENT  11/07/2011   Procedure: INSERTION OF ARTERIOVENOUS (AV) GORE-TEX GRAFT ARM;  Surgeon: Tinnie Gens, MD;  Location: [place];  Service: Vascular;  Laterality: Left;  . BACK SURGERY  1998  . BASCILIC VEIN TRANSPOSITION Right 02/27/2013   Procedure: BASCILIC VEIN TRANSPOSITION;  Surgeon: Mal Misty, MD;  Location: [place];  Service: Vascular;  Laterality: Right;  Right Basilic  Vein Transposition   . CERVICAL SPINE SURGERY  2/09   "to repair nerve problems in my left arm"  . CHOLECYSTECTOMY    . CORONARY ANGIOPLASTY WITH STENT PLACEMENT  06/11/2008  . CORONARY ANGIOPLASTY WITH STENT PLACEMENT  06/2007   TAXUS stent to RCA/notes 01/31/2011  . ESOPHAGOGASTRODUODENOSCOPY  09/28/2011   Procedure: ESOPHAGOGASTRODUODENOSCOPY (EGD);  Surgeon: Missy Sabins, MD;  Location: [facility] ENDOSCOPY;  Service: Endoscopy;  Laterality: N/A;  . ESOPHAGOGASTRODUODENOSCOPY N/A 04/07/2015   Procedure: ESOPHAGOGASTRODUODENOSCOPY (EGD);  Surgeon: Teena Irani, MD;  Location: [facility] ENDOSCOPY;  Service: Endoscopy;  Laterality: N/A;  . ESOPHAGOGASTRODUODENOSCOPY N/A 04/19/2015   Procedure: ESOPHAGOGASTRODUODENOSCOPY (EGD);  Surgeon: Arta Silence, MD;  Location: [facility] ENDOSCOPY;  Service: Endoscopy;  Laterality: N/A;  . FOREIGN BODY REMOVAL  09/2003   via upper endoscopy/notes 02/12/2011  . GIVENS CAPSULE STUDY  09/30/2011   Procedure: GIVENS CAPSULE STUDY;  Surgeon: Jeryl Columbia, MD;  Location: [facility] ENDOSCOPY;  Service: Endoscopy;  Laterality: N/A;  . INSERTION OF DIALYSIS CATHETER Right 2014  . INSERTION OF DIALYSIS CATHETER Left 02/11/2013   Procedure: INSERTION OF DIALYSIS CATHETER;  Surgeon: Conrad [place], MD;  Location: [place];  Service: Vascular;  Laterality: Left;  Ultrasound guided  . REMOVAL OF A DIALYSIS CATHETER Right 02/11/2013   Procedure: REMOVAL OF A DIALYSIS CATHETER;  Surgeon: Conrad Bethel, MD;  Location: [place];  Service: Vascular;  Laterality: Right;  . SAVORY DILATION N/A 04/07/2015   Procedure: SAVORY DILATION;  Surgeon: Teena Irani, MD;  Location: [facility] ENDOSCOPY;  Service: Endoscopy;  Laterality: N/A;  . SHUNTOGRAM N/A 09/20/2011   Procedure: Earney Mallet;  Surgeon: Conrad [place], MD;  Location: [facility] CATH LAB;  Service: Cardiovascular;  Laterality: N/A;  . TONSILLECTOMY    . TOTAL KNEE ARTHROPLASTY Right 08/02/2015   Procedure: TOTAL KNEE ARTHROPLASTY;  Surgeon: Renette Butters, MD;  Location: [place];  Service: Orthopedics;  Laterality: Right;  . VENOGRAM N/A 01/26/2013   Procedure: VENOGRAM;  Surgeon: Angelia Mould, MD;  Location: [facility] CATH LAB;  Service: Cardiovascular;  Laterality: N/A;    reports that he quit smoking about 10 years ago. His smoking use included Cigarettes. He has a 25.00 pack-year smoking history. He quit smokeless tobacco use about 10 years  ago. He reports that he does not drink alcohol or use drugs. family history includes Diabetes in his father; Heart disease in his father and sister; Hypertension in his father; Kidney failure in his father. Allergies  Allergen Reactions  . Cephalexin Swelling and Other (See Comments)    Tongue swelling  . Statins Other (See Comments)    Weak muscles  . Ciprofloxacin Rash   Current Outpatient Prescriptions on File Prior to Visit  Medication Sig Dispense Refill  . acetaminophen (TYLENOL) 325 MG tablet Take 1 tablet (325 mg total) by mouth every 4 (four) hours as needed for headache or mild pain.    [name] aspirin 81 MG tablet Take 81 mg by mouth daily.    . cinacalcet (SENSIPAR) 60 MG tablet Take 60 mg by mouth 2 (two) times daily.     . citalopram (CELEXA) 10 MG tablet Take 1 tablet (10 mg total) by mouth daily. 90 tablet 3  . ezetimibe (ZETIA) 10 MG tablet Take 1 tablet (10 mg total) by mouth daily. 90 tablet 3  . lanthanum (FOSRENOL) 1000 MG chewable tablet Chew 1,000 mg by mouth 2 (two) times daily with a meal. Reported on 12/23/2015    . midodrine (PROAMATINE) 2.5  MG tablet Take 1 tablet (2.5 mg total) by mouth 2 (two) times daily with a meal. ONLY ON HD DAY OR FOR BP LESS THAN 90 15 tablet 3  . multivitamin (RENA-VIT) TABS tablet Take 1 tablet by mouth daily. Reported on 10/24/2015    . repaglinide (PRANDIN) 0.5 MG tablet Take 1 tablet (0.5 mg total) by mouth daily with breakfast. 30 tablet 11   No current facility-administered medications on file prior to visit.    Review of Systems  Constitutional: Negative for unusual diaphoresis or night sweats HENT: Negative for ear swelling or discharge Eyes: Negative for worsening visual haziness  Respiratory: Negative for choking and stridor.   Gastrointestinal: Negative for distension or worsening eructation Genitourinary: Negative for retention or change in urine volume.  Musculoskeletal: Negative for other MSK pain or swelling Skin: Negative  for color change and worsening wound Neurological: Negative for tremors and numbness other than noted  Psychiatric/Behavioral: Negative for decreased concentration or agitation other than above       Objective:   Physical Exam BP 138/78   Pulse 74   Temp 98.5 F (36.9 C) (Oral)   Resp 20   Wt 232 lb (105.2 kg)   SpO2 90%   BMI 34.26 kg/m  VS noted,  Constitutional: Pt appears in no apparent distress HENT: Head: NCAT.  Right Ear: External ear normal.  Left Ear: External ear normal.  Eyes: . Pupils are equal, round, and reactive to light. Conjunctivae and EOM are normal Neck: Normal range of motion. Neck supple.  Cardiovascular: Normal rate and regular rhythm.   Pulmonary/Chest: Effort normal and breath sounds without rales or wheezing.  Neurological: Pt is alert., at baseline confused , motor grossly intact Skin: Skin is warm. No rash, trace bilat LE edema to ankles Psychiatric: Pt behavior is normal. No agitation. not depressed affect    Assessment & Plan:

## 2016-07-20 NOTE — Telephone Encounter (Signed)
Called patient, he did not understand me, and states to call back when his wife is home to discuss his results. I stated I would. Will try again later.

## 2016-07-21 DIAGNOSIS — D631 Anemia in chronic kidney disease: Secondary | ICD-10-CM | POA: Diagnosis not present

## 2016-07-21 DIAGNOSIS — N2581 Secondary hyperparathyroidism of renal origin: Secondary | ICD-10-CM | POA: Diagnosis not present

## 2016-07-21 DIAGNOSIS — E119 Type 2 diabetes mellitus without complications: Secondary | ICD-10-CM | POA: Diagnosis not present

## 2016-07-21 DIAGNOSIS — N186 End stage renal disease: Secondary | ICD-10-CM | POA: Diagnosis not present

## 2016-07-23 ENCOUNTER — Telehealth: Payer: Self-pay

## 2016-07-23 NOTE — Telephone Encounter (Signed)
Called and spoke with wife about results from [place], advised that they had been sent to a cardiologist referral and they would be contacting them with an appointment to continue further. No questions at this time.

## 2016-07-24 DIAGNOSIS — E119 Type 2 diabetes mellitus without complications: Secondary | ICD-10-CM | POA: Diagnosis not present

## 2016-07-24 DIAGNOSIS — D631 Anemia in chronic kidney disease: Secondary | ICD-10-CM | POA: Diagnosis not present

## 2016-07-24 DIAGNOSIS — N186 End stage renal disease: Secondary | ICD-10-CM | POA: Diagnosis not present

## 2016-07-24 DIAGNOSIS — N2581 Secondary hyperparathyroidism of renal origin: Secondary | ICD-10-CM | POA: Diagnosis not present

## 2016-07-26 DIAGNOSIS — D631 Anemia in chronic kidney disease: Secondary | ICD-10-CM | POA: Diagnosis not present

## 2016-07-26 DIAGNOSIS — N186 End stage renal disease: Secondary | ICD-10-CM | POA: Diagnosis not present

## 2016-07-26 DIAGNOSIS — E119 Type 2 diabetes mellitus without complications: Secondary | ICD-10-CM | POA: Diagnosis not present

## 2016-07-26 DIAGNOSIS — N2581 Secondary hyperparathyroidism of renal origin: Secondary | ICD-10-CM | POA: Diagnosis not present

## 2016-07-28 DIAGNOSIS — N186 End stage renal disease: Secondary | ICD-10-CM | POA: Diagnosis not present

## 2016-07-28 DIAGNOSIS — E119 Type 2 diabetes mellitus without complications: Secondary | ICD-10-CM | POA: Diagnosis not present

## 2016-07-28 DIAGNOSIS — D631 Anemia in chronic kidney disease: Secondary | ICD-10-CM | POA: Diagnosis not present

## 2016-07-28 DIAGNOSIS — N2581 Secondary hyperparathyroidism of renal origin: Secondary | ICD-10-CM | POA: Diagnosis not present

## 2016-07-31 DIAGNOSIS — Z992 Dependence on renal dialysis: Secondary | ICD-10-CM | POA: Diagnosis not present

## 2016-07-31 DIAGNOSIS — N2581 Secondary hyperparathyroidism of renal origin: Secondary | ICD-10-CM | POA: Diagnosis not present

## 2016-07-31 DIAGNOSIS — E119 Type 2 diabetes mellitus without complications: Secondary | ICD-10-CM | POA: Diagnosis not present

## 2016-07-31 DIAGNOSIS — N186 End stage renal disease: Secondary | ICD-10-CM | POA: Diagnosis not present

## 2016-07-31 DIAGNOSIS — E1129 Type 2 diabetes mellitus with other diabetic kidney complication: Secondary | ICD-10-CM | POA: Diagnosis not present

## 2016-07-31 DIAGNOSIS — D631 Anemia in chronic kidney disease: Secondary | ICD-10-CM | POA: Diagnosis not present

## 2016-08-02 DIAGNOSIS — N186 End stage renal disease: Secondary | ICD-10-CM | POA: Diagnosis not present

## 2016-08-02 DIAGNOSIS — E119 Type 2 diabetes mellitus without complications: Secondary | ICD-10-CM | POA: Diagnosis not present

## 2016-08-02 DIAGNOSIS — N2581 Secondary hyperparathyroidism of renal origin: Secondary | ICD-10-CM | POA: Diagnosis not present

## 2016-08-02 DIAGNOSIS — D631 Anemia in chronic kidney disease: Secondary | ICD-10-CM | POA: Diagnosis not present

## 2016-08-03 DIAGNOSIS — N186 End stage renal disease: Secondary | ICD-10-CM | POA: Diagnosis not present

## 2016-08-03 DIAGNOSIS — Z992 Dependence on renal dialysis: Secondary | ICD-10-CM | POA: Diagnosis not present

## 2016-08-03 DIAGNOSIS — I871 Compression of vein: Secondary | ICD-10-CM | POA: Diagnosis not present

## 2016-08-03 DIAGNOSIS — T82858D Stenosis of vascular prosthetic devices, implants and grafts, subsequent encounter: Secondary | ICD-10-CM | POA: Diagnosis not present

## 2016-08-04 ENCOUNTER — Encounter (HOSPITAL_COMMUNITY): Payer: Self-pay

## 2016-08-04 ENCOUNTER — Other Ambulatory Visit: Payer: Self-pay

## 2016-08-04 ENCOUNTER — Emergency Department (HOSPITAL_COMMUNITY): Payer: Medicare Other

## 2016-08-04 ENCOUNTER — Inpatient Hospital Stay (HOSPITAL_COMMUNITY)
Admission: EM | Admit: 2016-08-04 | Discharge: 2016-08-08 | DRG: 246 | Disposition: A | Discharge status: Home / Self Care | Payer: Medicare Other | Attending: Internal Medicine | Admitting: Internal Medicine

## 2016-08-04 ENCOUNTER — Other Ambulatory Visit (HOSPITAL_COMMUNITY): Payer: Self-pay

## 2016-08-04 DIAGNOSIS — Z86010 Personal history of colonic polyps: Secondary | ICD-10-CM

## 2016-08-04 DIAGNOSIS — R031 Nonspecific low blood-pressure reading: Secondary | ICD-10-CM | POA: Diagnosis not present

## 2016-08-04 DIAGNOSIS — R9431 Abnormal electrocardiogram [ECG] [EKG]: Secondary | ICD-10-CM

## 2016-08-04 DIAGNOSIS — I25110 Atherosclerotic heart disease of native coronary artery with unstable angina pectoris: Secondary | ICD-10-CM | POA: Diagnosis present

## 2016-08-04 DIAGNOSIS — R079 Chest pain, unspecified: Secondary | ICD-10-CM

## 2016-08-04 DIAGNOSIS — D649 Anemia, unspecified: Secondary | ICD-10-CM | POA: Diagnosis present

## 2016-08-04 DIAGNOSIS — E8779 Other fluid overload: Secondary | ICD-10-CM | POA: Diagnosis not present

## 2016-08-04 DIAGNOSIS — E1142 Type 2 diabetes mellitus with diabetic polyneuropathy: Secondary | ICD-10-CM | POA: Diagnosis present

## 2016-08-04 DIAGNOSIS — E785 Hyperlipidemia, unspecified: Secondary | ICD-10-CM | POA: Diagnosis present

## 2016-08-04 DIAGNOSIS — I953 Hypotension of hemodialysis: Secondary | ICD-10-CM | POA: Diagnosis present

## 2016-08-04 DIAGNOSIS — E8889 Other specified metabolic disorders: Secondary | ICD-10-CM | POA: Diagnosis not present

## 2016-08-04 DIAGNOSIS — Z955 Presence of coronary angioplasty implant and graft: Secondary | ICD-10-CM

## 2016-08-04 DIAGNOSIS — Z87891 Personal history of nicotine dependence: Secondary | ICD-10-CM

## 2016-08-04 DIAGNOSIS — R531 Weakness: Secondary | ICD-10-CM | POA: Diagnosis not present

## 2016-08-04 DIAGNOSIS — R0789 Other chest pain: Secondary | ICD-10-CM | POA: Diagnosis not present

## 2016-08-04 DIAGNOSIS — I214 Non-ST elevation (NSTEMI) myocardial infarction: Secondary | ICD-10-CM | POA: Diagnosis not present

## 2016-08-04 DIAGNOSIS — E119 Type 2 diabetes mellitus without complications: Secondary | ICD-10-CM | POA: Diagnosis not present

## 2016-08-04 DIAGNOSIS — N2581 Secondary hyperparathyroidism of renal origin: Secondary | ICD-10-CM | POA: Diagnosis present

## 2016-08-04 DIAGNOSIS — I120 Hypertensive chronic kidney disease with stage 5 chronic kidney disease or end stage renal disease: Secondary | ICD-10-CM | POA: Diagnosis not present

## 2016-08-04 DIAGNOSIS — Z833 Family history of diabetes mellitus: Secondary | ICD-10-CM

## 2016-08-04 DIAGNOSIS — Z96651 Presence of right artificial knee joint: Secondary | ICD-10-CM | POA: Diagnosis present

## 2016-08-04 DIAGNOSIS — N186 End stage renal disease: Secondary | ICD-10-CM

## 2016-08-04 DIAGNOSIS — I2584 Coronary atherosclerosis due to calcified coronary lesion: Secondary | ICD-10-CM | POA: Diagnosis present

## 2016-08-04 DIAGNOSIS — Z7982 Long term (current) use of aspirin: Secondary | ICD-10-CM

## 2016-08-04 DIAGNOSIS — Z7984 Long term (current) use of oral hypoglycemic drugs: Secondary | ICD-10-CM

## 2016-08-04 DIAGNOSIS — M109 Gout, unspecified: Secondary | ICD-10-CM | POA: Diagnosis present

## 2016-08-04 DIAGNOSIS — D631 Anemia in chronic kidney disease: Secondary | ICD-10-CM | POA: Diagnosis not present

## 2016-08-04 DIAGNOSIS — Z79899 Other long term (current) drug therapy: Secondary | ICD-10-CM

## 2016-08-04 DIAGNOSIS — E1122 Type 2 diabetes mellitus with diabetic chronic kidney disease: Secondary | ICD-10-CM | POA: Diagnosis not present

## 2016-08-04 DIAGNOSIS — I132 Hypertensive heart and chronic kidney disease with heart failure and with stage 5 chronic kidney disease, or end stage renal disease: Secondary | ICD-10-CM | POA: Diagnosis not present

## 2016-08-04 DIAGNOSIS — Z8249 Family history of ischemic heart disease and other diseases of the circulatory system: Secondary | ICD-10-CM

## 2016-08-04 DIAGNOSIS — F329 Major depressive disorder, single episode, unspecified: Secondary | ICD-10-CM | POA: Diagnosis present

## 2016-08-04 DIAGNOSIS — G4733 Obstructive sleep apnea (adult) (pediatric): Secondary | ICD-10-CM | POA: Diagnosis present

## 2016-08-04 DIAGNOSIS — Z8546 Personal history of malignant neoplasm of prostate: Secondary | ICD-10-CM

## 2016-08-04 DIAGNOSIS — Z992 Dependence on renal dialysis: Secondary | ICD-10-CM

## 2016-08-04 DIAGNOSIS — I959 Hypotension, unspecified: Secondary | ICD-10-CM

## 2016-08-04 DIAGNOSIS — I5042 Chronic combined systolic (congestive) and diastolic (congestive) heart failure: Secondary | ICD-10-CM | POA: Diagnosis present

## 2016-08-04 DIAGNOSIS — Z841 Family history of disorders of kidney and ureter: Secondary | ICD-10-CM

## 2016-08-04 LAB — I-STAT TROPONIN, ED: TROPONIN I, POC: 0.07 ng/mL (ref 0.00–0.08)

## 2016-08-04 LAB — CBC WITH DIFFERENTIAL/PLATELET
BASOS PCT: 0 %
Basophils Absolute: 0 10*3/uL (ref 0.0–0.1)
Eosinophils Absolute: 0.1 10*3/uL (ref 0.0–0.7)
Eosinophils Relative: 2 %
HEMATOCRIT: 37.8 % — AB (ref 39.0–52.0)
HEMOGLOBIN: 12.2 g/dL — AB (ref 13.0–17.0)
LYMPHS PCT: 20 %
Lymphs Abs: 1.4 10*3/uL (ref 0.7–4.0)
MCH: 30.9 pg (ref 26.0–34.0)
MCHC: 32.3 g/dL (ref 30.0–36.0)
MCV: 95.7 fL (ref 78.0–100.0)
MONO ABS: 0.9 10*3/uL (ref 0.1–1.0)
MONOS PCT: 12 %
NEUTROS PCT: 66 %
Neutro Abs: 4.7 10*3/uL (ref 1.7–7.7)
Platelets: 256 10*3/uL (ref 150–400)
RBC: 3.95 MIL/uL — ABNORMAL LOW (ref 4.22–5.81)
RDW: 16.1 % — AB (ref 11.5–15.5)
WBC: 7.1 10*3/uL (ref 4.0–10.5)

## 2016-08-04 LAB — COMPREHENSIVE METABOLIC PANEL
ALBUMIN: 3.4 g/dL — AB (ref 3.5–5.0)
ALK PHOS: 110 U/L (ref 38–126)
ALT: 18 U/L (ref 17–63)
ANION GAP: 13 (ref 5–15)
AST: 19 U/L (ref 15–41)
BUN: 20 mg/dL (ref 6–20)
CALCIUM: 8.8 mg/dL — AB (ref 8.9–10.3)
CO2: 26 mmol/L (ref 22–32)
Chloride: 98 mmol/L — ABNORMAL LOW (ref 101–111)
Creatinine, Ser: 6.66 mg/dL — ABNORMAL HIGH (ref 0.61–1.24)
GFR calc Af Amer: 9 mL/min — ABNORMAL LOW (ref >60)
GFR, EST NON AFRICAN AMERICAN: 8 mL/min — AB (ref >60)
GLUCOSE: 163 mg/dL — AB (ref 65–99)
Potassium: 4.3 mmol/L (ref 3.5–5.1)
Sodium: 137 mmol/L (ref 135–145)
TOTAL PROTEIN: 7.6 g/dL (ref 6.5–8.1)
Total Bilirubin: 0.7 mg/dL (ref 0.3–1.2)

## 2016-08-04 LAB — I-STAT CHEM 8, ED
BUN: 25 mg/dL — AB (ref 6–20)
CHLORIDE: 101 mmol/L (ref 101–111)
CREATININE: 6.5 mg/dL — AB (ref 0.61–1.24)
Calcium, Ion: 0.97 mmol/L — ABNORMAL LOW (ref 1.15–1.40)
Glucose, Bld: 161 mg/dL — ABNORMAL HIGH (ref 65–99)
HEMATOCRIT: 39 % (ref 39.0–52.0)
Hemoglobin: 13.3 g/dL (ref 13.0–17.0)
POTASSIUM: 4.4 mmol/L (ref 3.5–5.1)
SODIUM: 141 mmol/L (ref 135–145)
TCO2: 30 mmol/L (ref 0–100)

## 2016-08-04 LAB — I-STAT CG4 LACTIC ACID, ED: Lactic Acid, Venous: 2.27 mmol/L (ref 0.5–1.9)

## 2016-08-04 IMAGING — NM NM BONE WHOLE BODY
2 series · 2 of 2 positions shown · non-contrast
Comparison: Multiple exams, including CT scan of 10/14/2015 and
08/10/2015

CLINICAL DATA: Prostate cancer, PSA level 5.65.

EXAM:
NUCLEAR MEDICINE WHOLE BODY BONE SCAN
TECHNIQUE: Whole body anterior and posterior images were obtained approximately
3 hours after intravenous injection of radiopharmaceutical.
RADIOPHARMACEUTICALS:  26.8 mCi Hechnetium-QQm MDP IV

[Series 1: wbr_bone_40 whole body · 2.66mm/px · 1 of 1 slices shown (1 of 2)]
[im 1/1]
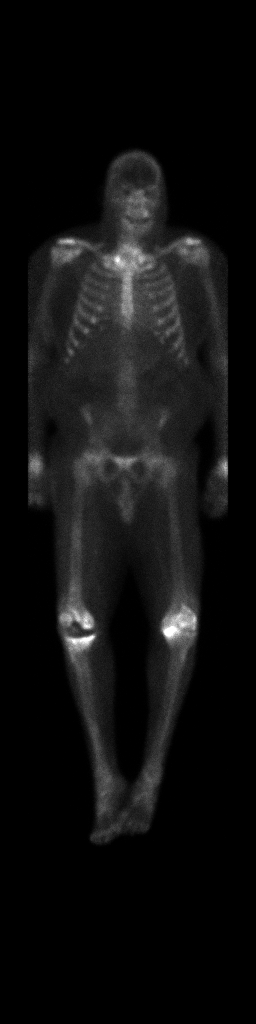

[Series 1: wbr_bone_40 whole body · 2.66mm/px · 1 of 1 slices shown (2 of 2)]
[im 1/1]
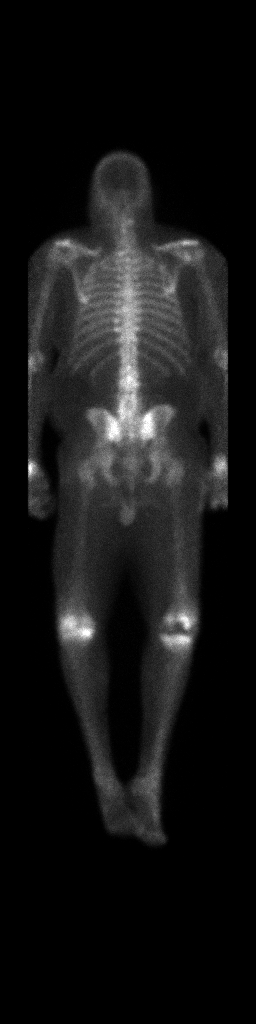

[2 of 2 positions shown; findings below may reference images not displayed]

FINDINGS: Evidence of recent total knee arthroplasty, right knee, with
photopenia from the prosthesis and adjacent high activity from
normal reaction. There is also high activity in the left knee
suggesting prominent osteoarthritis.

Vaguely increased activity in the lower thoracic and lumbar spine
compatible with the patient's prior operative intervention scanned
the generalized considerable spondylosis and degenerative endplate
findings. On the CT scan from 10/14/2015 the appearance is not
considered characteristic for osseous metastatic disease in the
lumbar spine or bony pelvis.

Degenerative findings in the shoulders and right greater the left
sternoclavicular joints.
IMPRESSION: 1. No findings of osseous metastatic disease.
2. Postoperative findings along the right knee. Degenerative
findings in the thoracolumbar spine and left knee, as well as the
shoulders and sternoclavicular joints.

## 2016-08-04 MED ORDER — SODIUM CHLORIDE 0.9 % IV BOLUS (SEPSIS)
500.0000 mL | Freq: Once | INTRAVENOUS | Status: AC
  Administered 2016-08-04: 500 mL via INTRAVENOUS

## 2016-08-04 NOTE — ED Triage Notes (Signed)
Pt presents to the ed with complaints of going to dialysis at 1100, he became hypotensive at dialysis that was fixed with iv fluid, once he got home he said he just felt worse than normal and started having pain in his chest at 1800 that did not radiate, he took 4 baby aspirin and called 911, on arrival to the ed his chest pain has subsided

## 2016-08-04 NOTE — ED Provider Notes (Signed)
[facility] DEPT Provider Note   CSN: [id number] Arrival date & time: 08/04/16  2029     History   Chief Complaint Chief Complaint  Patient presents with  . Chest Pain    HPI DEKLYN GIBBON is a 65 y.o. male.  HPI  65 yo M with PMHx of HTN, HLD, CAD, CHF, ESRD on TTSa HD who p/w chest pain during and after dialysis. Per review of records, pt has recent h/o persistent hypotension during dialysis for which he was taken off his BP meds and placed on midodrine. He states that at dialysis today, he developed mild, dull, substernal chest pressure with SOB. This improved after IVF at dialysis. However, upon returning home he developed worsening chest pressure, SOB, and mild nausea at rest. BP noted to "keep dropping" at home ot the 40J systolic. EMS subsequently called and pt brought here. Denies current CP, SOB, nausea, or diaphoresis. He did take his midodrine today. No recent fevers.  Past Medical History:  Diagnosis Date  . Allergic rhinitis, cause unspecified 02/24/2014  . Anemia 06/16/2011  . BENIGN PROSTATIC HYPERTROPHY 10/14/2009  . CAD, NATIVE VESSEL 02/06/2009   saw Dr. Missy Sabins last jan 2013  . Cervical radiculopathy, chronic 02/23/2016   Right c5-6 by NCS/EMG  . CHEST PAIN 03/29/2010  . COLONIC POLYPS, HX OF 10/14/2009  . CONGESTIVE HEART FAILURE 06/18/2007  . Dementia [id number]  . DEPRESSION 10/14/2009  . Depression 09/24/2015  . DIABETES MELLITUS, TYPE II 02/01/2010  . DIASTOLIC HEART FAILURE, CHRONIC 02/06/2009  . DIZZINESS 07/17/2010  . DYSLIPIDEMIA 06/18/2007  . DYSPNEA 10/29/2008  . ESRD (end stage renal disease) on dialysis ([place]) 08/04/2010   "TTS;  " (04/18/2015)  . FOOT PAIN 08/12/2008  . GAIT DISTURBANCE 03/03/2010  . GASTROENTERITIS, VIRAL 10/14/2009  . GERD 06/18/2007  . GOITER, MULTINODULAR 12/26/2007  . GOUT 06/18/2007  . GYNECOMASTIA 07/17/2010  . Hemodialysis access, fistula mature [facility])    Dialysis T-Th-Sa ([place]) Right upper arm fistula  .  Hyperlipidemia 10/16/2011  . Hyperparathyroidism, secondary ([place]) 06/16/2011  . HYPERTENSION 06/18/2007  . Hyperthyroidism   . HYPERTHYROIDISM 02/02/2010  . Hypocalcemia 06/07/2010  . Ischemic cardiomyopathy 06/16/2011  . NECK PAIN 07/31/2010  . ONYCHOMYCOSIS, TOENAILS 12/26/2007  . OSA on CPAP 10/16/2011  . Other malaise and fatigue 11/24/2009  . PERIPHERAL NEUROPATHY 06/18/2007  . Prostate cancer ([place])   . PULMONARY NODULE, RIGHT LOWER LOBE 06/08/2009  . Renal insufficiency   . Sleep apnea    cpap machine and o2  . TRANSAMINASES, SERUM, ELEVATED 02/01/2010  . Transfusion history    none recent  . Unspecified hypotension 01/30/2010    Patient Active Problem List   Diagnosis Date Noted  . ST segment depression 08/05/2016  . PAD (peripheral artery disease) ([place]) 06/18/2016  . Hypotension 04/29/2016  . Cervical radiculopathy, chronic 02/23/2016  . Memory loss 12/22/2015  . Left-sided weakness 12/22/2015  . Peripheral polyneuropathy ([place]) 12/22/2015  . Diabetes ([place]) 10/31/2015  . Nausea without vomiting 10/31/2015  . Malignant neoplasm of prostate ([place]) 10/24/2015  . Right leg pain 09/04/2015  . Chest pain 08/09/2015  . Acute encephalopathy 08/09/2015  . Encephalopathy acute 08/09/2015  . S/P right total knee replacement 08/09/2015  . Primary osteoarthritis of right knee 08/05/2015  . Neuropathy due to secondary diabetes mellitus ([place]) 08/05/2015  . Arthritis of knee 08/02/2015  . Headache 06/08/2015  . Melena 04/19/2015  . Acute blood loss anemia 04/18/2015  . Dialysis patient ([place]) 04/18/2015  . Hyperthyroidism 03/11/2015  .  Thigh pain 12/08/2014  . PSA elevation 08/30/2014  . Allergic rhinitis 02/24/2014  . Elevated PSA 02/24/2014  . Vertigo 10/14/2013  . Cough 06/26/2013  . Numbness 05/15/2013  . ESRD on dialysis ([place]) 04/06/2013  . Chronic systolic heart failure ([place]) 02/09/2013  . Other complications due to renal dialysis device, implant, and graft 01/14/2013  . Right hand  pain 03/28/2012  . Dysphagia 03/28/2012  . Hypogonadism, male 03/28/2012  . OSA (obstructive sleep apnea) 10/16/2011  . Hyperlipidemia 10/16/2011  . CAD (coronary artery disease) 09/28/2011  . NSTEMI (non-ST elevated myocardial infarction) ([place]) 09/28/2011  . AVM (arteriovenous malformation) 09/27/2011  . Anemia associated with acute blood loss 09/26/2011  . Anemia 06/16/2011  . Ischemic cardiomyopathy 06/16/2011  . Hyperparathyroidism, secondary ([place]) 06/16/2011  . Preventative health care 03/11/2011  . NECK PAIN 07/31/2010  . GYNECOMASTIA 07/17/2010  . DIZZINESS 07/17/2010  . GAIT DISTURBANCE 03/03/2010  . Thyrotoxicosis 02/02/2010  . TRANSAMINASES, SERUM, ELEVATED 02/01/2010  . Other malaise and fatigue 11/24/2009  . Depression 10/14/2009  . BENIGN PROSTATIC HYPERTROPHY 10/14/2009  . COLONIC POLYPS, HX OF 10/14/2009  . Dementia 09/02/2009  . PULMONARY NODULE, RIGHT LOWER LOBE 06/08/2009  . DIASTOLIC HEART FAILURE, CHRONIC 02/06/2009  . DYSPNEA 10/29/2008  . FOOT PAIN 08/12/2008  . ONYCHOMYCOSIS, TOENAILS 12/26/2007  . GOITER, MULTINODULAR 12/26/2007  . GOUT 06/18/2007  . Neuropathy ([place]) 06/18/2007  . Hypertensive heart disease with CHF (congestive heart failure) ([place]) 06/18/2007  . GERD 06/18/2007    Past Surgical History:  Procedure Laterality Date  . ARTERIOVENOUS GRAFT PLACEMENT Right 2009   forearm/notes 02/01/2011  . AV FISTULA PLACEMENT  11/07/2011   Procedure: INSERTION OF ARTERIOVENOUS (AV) GORE-TEX GRAFT ARM;  Surgeon: Tinnie Gens, MD;  Location: [facility];  Service: Vascular;  Laterality: Left;  . BACK SURGERY  1998  . BASCILIC VEIN TRANSPOSITION Right 02/27/2013   Procedure: BASCILIC VEIN TRANSPOSITION;  Surgeon: Mal Misty, MD;  Location: [facility];  Service: Vascular;  Laterality: Right;  Right Basilic Vein Transposition   . CERVICAL SPINE SURGERY  2/09   "to repair nerve problems in my left arm"  . CHOLECYSTECTOMY    . CORONARY ANGIOPLASTY WITH STENT  PLACEMENT  06/11/2008  . CORONARY ANGIOPLASTY WITH STENT PLACEMENT  06/2007   TAXUS stent to RCA/notes 01/31/2011  . ESOPHAGOGASTRODUODENOSCOPY  09/28/2011   Procedure: ESOPHAGOGASTRODUODENOSCOPY (EGD);  Surgeon: Missy Sabins, MD;  Location: [facility] ENDOSCOPY;  Service: Endoscopy;  Laterality: N/A;  . ESOPHAGOGASTRODUODENOSCOPY N/A 04/07/2015   Procedure: ESOPHAGOGASTRODUODENOSCOPY (EGD);  Surgeon: Teena Irani, MD;  Location: [facility] ENDOSCOPY;  Service: Endoscopy;  Laterality: N/A;  . ESOPHAGOGASTRODUODENOSCOPY N/A 04/19/2015   Procedure: ESOPHAGOGASTRODUODENOSCOPY (EGD);  Surgeon: Arta Silence, MD;  Location: [facility] ENDOSCOPY;  Service: Endoscopy;  Laterality: N/A;  . FOREIGN BODY REMOVAL  09/2003   via upper endoscopy/notes 02/12/2011  . GIVENS CAPSULE STUDY  09/30/2011   Procedure: GIVENS CAPSULE STUDY;  Surgeon: Jeryl Columbia, MD;  Location: [facility] ENDOSCOPY;  Service: Endoscopy;  Laterality: N/A;  . INSERTION OF DIALYSIS CATHETER Right 2014  . INSERTION OF DIALYSIS CATHETER Left 02/11/2013   Procedure: INSERTION OF DIALYSIS CATHETER;  Surgeon: Conrad [place], MD;  Location: [facility];  Service: Vascular;  Laterality: Left;  Ultrasound guided  . REMOVAL OF A DIALYSIS CATHETER Right 02/11/2013   Procedure: REMOVAL OF A DIALYSIS CATHETER;  Surgeon: Conrad [place], MD;  Location: [facility];  Service: Vascular;  Laterality: Right;  . SAVORY DILATION N/A 04/07/2015   Procedure: SAVORY DILATION;  Surgeon:  Teena Irani, MD;  Location: [facility] ENDOS[facility];  Service: Endoscopy;  Laterality: N/A;  . SHUNTOGRAM N/A 09/20/2011   Procedure: Earney Mallet;  Surgeon: Conrad Milton, MD;  Location[facility] CATH LAB;  Service: Cardiovascular;  Laterality: N/A;  . TONSILLECTOMY    . TOTAL KNEE ARTHROPLASTY Right 08/02/2015   Procedure: TOTAL KNEE ARTHROPLASTY;  Surgeon: Renette Butters, MD;  Location: [facility];  Service: Orthopedics;  Laterality: Right;  . VENOGRAM N/A 01/26/2013   Procedure: VENOGRAM;  Surgeon: Angelia Mould, MD;  Location[facility] C[facility] LAB;   Service: Cardiovascular;  Laterality: N/A;       Home Medications    Prior to Admission medications   Medication Sig Start Date End Date Taking? Authorizing Provider  allopurinol (ZYLOPRIM) 100 MG tablet Take 1 tablet (100 mg total) by mouth daily. 07/20/16  Yes Biagio Borg, MD  aspirin 81 MG tablet Take 81 mg by mouth daily.   Yes Historical Provider, MD  cinacalcet (SENSIPAR) 60 MG tablet Take 60 mg by mouth 2 (two) times daily.    Yes Historical Provider, MD  colesevelam (WELCHOL) 625 MG tablet TAKE THREE TABLETS BY MOUTH TWICE DAILY WITH MEALS Patient taking differently: Take 1,250 mg by mouth 2 (two) times daily with a meal. TAKE THREE TABLETS BY MOUTH TWICE DAILY WITH MEALS 07/20/16  Yes Biagio Borg, MD  ezetimibe (ZETIA) 10 MG tablet Take 1 tablet (10 mg total) by mouth daily. 03/15/16  Yes Jolaine Artist, MD  gabapentin (NEURONTIN) 300 MG capsule 1-2 tabs by mouth at bedtime for sleep and pain Patient taking differently: Take 600 mg by mouth at bedtime. 1-2 tabs by mouth at bedtime for sleep and pain 07/20/16  Yes Biagio Borg, MD  lanthanum (FOSRENOL) 1000 MG chewable tablet Chew 1,000 mg by mouth 2 (two) times daily with a meal. Reported on 12/23/2015   Yes Historical Provider, MD  midodrine (PROAMATINE) 2.5 MG tablet Take 1 tablet (2.5 mg total) by mouth 2 (two) times daily with a meal. ONLY ON HD DAY OR FOR BP LESS THAN 90 04/25/16  Yes Jolaine Artist, MD  multivitamin (RENA-VIT) TABS tablet Take 1 tablet by mouth daily. Reported on 10/24/2015   Yes Historical Provider, MD  repaglinide (PRANDIN) 0.5 MG tablet Take 1 tablet (0.5 mg total) by mouth daily with breakfast. 06/19/16  Yes Renato Shin, MD  acetaminophen (TYLENOL) 325 MG tablet Take 1 tablet (325 mg total) by mouth every 4 (four) hours as needed for headache or mild pain. Patient not taking: Reported on 08/04/2016 08/10/15   Silver Huguenin Elgergawy, MD  citalopram (CELEXA) 10 MG tablet Take 1 tablet (10 mg total) by mouth  daily. 09/21/15   Biagio Borg, MD    Family History Family History  Problem Relation Age of Onset  . Heart disease Sister   . Heart disease Father   . Diabetes Father   . Kidney failure Father   . Hypertension Father   . Cancer Neg Hx     Social History Social History  Substance Use Topics  . Smoking status: Former Smoker    Packs/day: 1.00    Years: 25.00    Types: Cigarettes    Quit date: 10/01/2005  . Smokeless tobacco: Former Systems developer    Quit date: 10/01/2005     Comment: Quit smoking 2007 Smoked x 25 years 1/2 ppd.  . Alcohol use No     Allergies   Cephalexin; Statins; and Ciprofloxacin   Review of Systems  Review of Systems  Constitutional: Positive for fatigue. Negative for chills and fever.  HENT: Negative for congestion and rhinorrhea.   Eyes: Negative for visual disturbance.  Respiratory: Negative for cough, shortness of breath and wheezing.   Cardiovascular: Positive for leg swelling. Negative for chest pain.  Gastrointestinal: Negative for abdominal pain, diarrhea, nausea and vomiting.  Genitourinary: Negative for dysuria and flank pain.  Musculoskeletal: Negative for neck pain and neck stiffness.  Skin: Negative for rash and wound.  Allergic/Immunologic: Negative for immunocompromised state.  Neurological: Negative for syncope, weakness and headaches.  All other systems reviewed and are negative.    Physical Exam Updated Vital Signs BP 123/67 (BP Location: Left Arm)   Pulse 87   Temp 98.6 F (37 C) (Oral)   Resp 14   Ht 6' (1.829 m)   Wt 231 lb 8 oz (105 kg)   SpO2 94%   BMI 31.40 kg/m   Physical Exam  Constitutional: He is oriented to person, place, and time. He appears well-developed and well-nourished. No distress.  HENT:  Head: Normocephalic and atraumatic.  Eyes: Conjunctivae are normal.  Neck: Neck supple.  Cardiovascular: Normal rate, regular rhythm and normal heart sounds.  Exam reveals no friction rub.   No murmur  heard. Pulmonary/Chest: Effort normal and breath sounds normal. No respiratory distress. He has no wheezes. He has no rales.  Abdominal: Soft. He exhibits no distension. There is no tenderness. There is no guarding.  Musculoskeletal: He exhibits edema (trace, b/l LE).  Neurological: He is alert and oriented to person, place, and time. He exhibits normal muscle tone.  Skin: Skin is warm. Capillary refill takes less than 2 seconds.  Psychiatric: He has a normal mood and affect.  Nursing note and vitals reviewed.    ED Treatments / Results  Labs (all labs ordered are listed, but only abnormal results are displayed) Labs Reviewed  CBC WITH DIFFERENTIAL/PLATELET - Abnormal; Notable for the following:       Result Value   RBC 3.95 (*)    Hemoglobin 12.2 (*)    HCT 37.8 (*)    RDW 16.1 (*)    All other components within normal limits  COMPREHENSIVE METABOLIC PANEL - Abnormal; Notable for the following:    Chloride 98 (*)    Glucose, Bld 163 (*)    Creatinine, Ser 6.66 (*)    Calcium 8.8 (*)    Albumin 3.4 (*)    GFR calc non Af Amer 8 (*)    GFR calc Af Amer 9 (*)    All other components within normal limits  TROPONIN I - Abnormal; Notable for the following:    Troponin I 0.21 (*)    All other components within normal limits  TROPONIN I - Abnormal; Notable for the following:    Troponin I 0.32 (*)    All other components within normal limits  GLUCOSE, CAPILLARY - Abnormal; Notable for the following:    Glucose-Capillary 230 (*)    All other components within normal limits  GLUCOSE, CAPILLARY - Abnormal; Notable for the following:    Glucose-Capillary 116 (*)    All other components within normal limits  I-STAT CG4 LACTIC ACID, ED - Abnormal; Notable for the following:    Lactic Acid, Venous 2.27 (*)    All other components within normal limits  I-STAT CHEM 8, ED - Abnormal; Notable for the following:    BUN 25 (*)    Creatinine, Ser 6.50 (*)    Glucose, Bld  161 (*)     Calcium, Ion 0.97 (*)    All other components within normal limits  I-STAT TROPOININ, ED    EKG  EKG Interpretation  Date/Time:  Saturday August 04 2016 22:43:09 EDT Ventricular Rate:  78 PR Interval:    QRS Duration: 103 QT Interval:  399 QTC Calculation: 455 R Axis:   38 Text Interpretation:  Sinus rhythm Nonspecific T abnormalities, lateral leads Since last tracing, ST depressions inferolaterally have improved TWI remains persistent laterally Dynamic EKG changes compared to recent Confirmed by Larita Deremer MD, Laprecious Austill [phone]) on 08/05/2016 11:18:15 AM       Radiology Dg Chest 2 View  Result Date: 08/04/2016 CLINICAL DATA:  Acute onset of substernal chest pain and hypotension. Initial encounter. EXAM: CHEST  2 VIEW COMPARISON:  Chest radiograph from 06/18/2016 FINDINGS: The lungs are well-aerated. Vascular congestion is noted. Increased interstitial markings may reflect mild interstitial edema. There is no evidence of pleural effusion or pneumothorax. The heart is normal in size; the mediastinal contour is within normal limits. No acute osseous abnormalities are seen. IMPRESSION: Vascular congestion noted. Increased interstitial markings may reflect mild interstitial edema. Electronically Signed   By: Garald Balding M.D.   On: 08/04/2016 21:47    Procedures Procedures (including critical care time)  Medications Ordered in ED Medications  colesevelam [facility]) tablet 1,250 mg (1,250 mg Oral Given 08/05/16 0804)  aspirin chewable tablet 81 mg (81 mg Oral Given 08/05/16 0804)  lanthanum (FOSRENOL) chewable tablet 1,000 mg (1,000 mg Oral Given 08/05/16 0803)  cinacalcet (SENSIPAR) tablet 60 mg (60 mg Oral Given 08/05/16 0804)  nitroGLYCERIN (NITROSTAT) SL tablet 0.4 mg (not administered)  acetaminophen (TYLENOL) tablet 650 mg (not administered)  ondansetron (ZOFRAN) injection 4 mg (not administered)  insulin aspart (novoLOG) injection 0-9 Units (0 Units Subcutaneous Not Given 08/05/16 0800)   allopurinol (ZYLOPRIM) tablet 100 mg (100 mg Oral Given 08/05/16 0900)  citalopram (CELEXA) tablet 10 mg (10 mg Oral Given 08/05/16 0900)  ezetimibe (ZETIA) tablet 10 mg (10 mg Oral Given 08/05/16 0900)  gabapentin (NEURONTIN) capsule 300-600 mg (not administered)  multivitamin (RENA-VIT) tablet 1 tablet (not administered)  sodium chloride 0.9 % bolus 500 mL (0 mLs Intravenous Stopped 08/04/16 2346)  heparin bolus via infusion 2,000 Units (2,000 Units Intravenous Bolus from Bag 08/05/16 0906)     Initial Impression / Assessment and Plan / ED Course  I have reviewed the triage vital signs and the nursing notes.  Pertinent labs & imaging results that were available during my care of the patient were reviewed by me and considered in my medical decision making (see chart for details).  Clinical Course     65 yo M with extensive PMHx as above including HTN, HLD, ESRD, CAD who p/w typical CP in setting of hypotension during and immediately after dialysis, now resolved with improvement in BP. EKG on arrival shows diffuse ST depressions c/f inferolateral ischemia, now resolved after IVF and improvement in BP. At this time, my primary concern is hypotension related angina, possibly 2/2 underlying CAD. Discussed with Cardiology as well as Hospitalist. Initial trop negative, labs o/w largely unremarkable with exception of mild LA - likely 2/2 dehydration and hypoperfusion during dialysis. Pt is adamant about no infectious sx, is afebrile, with normal WBC and I do not suspect sepsis. CXR without PNA. Will admit to hospitalist service for trending trops, gentle fluids, and plan to re-consult Cards if trop is positive, as pt will likley need heparin/cardiac cath. Pt CP free at  this time.  Final Clinical Impressions(s) / ED Diagnoses   Final diagnoses:  Chest pain with high risk for cardiac etiology  Abnormal EKG  ESRD (end stage renal disease) ([place])  Transient hypotension    New Prescriptions Current  Discharge Medication List       Duffy Bruce, MD 08/05/16 1124

## 2016-08-04 NOTE — ED Notes (Signed)
Returned from [facility] and placed on monitor

## 2016-08-05 ENCOUNTER — Encounter (HOSPITAL_COMMUNITY): Payer: Self-pay

## 2016-08-05 DIAGNOSIS — N2581 Secondary hyperparathyroidism of renal origin: Secondary | ICD-10-CM | POA: Diagnosis not present

## 2016-08-05 DIAGNOSIS — Z96651 Presence of right artificial knee joint: Secondary | ICD-10-CM | POA: Diagnosis present

## 2016-08-05 DIAGNOSIS — Z7982 Long term (current) use of aspirin: Secondary | ICD-10-CM | POA: Diagnosis not present

## 2016-08-05 DIAGNOSIS — I2510 Atherosclerotic heart disease of native coronary artery without angina pectoris: Secondary | ICD-10-CM | POA: Diagnosis not present

## 2016-08-05 DIAGNOSIS — I132 Hypertensive heart and chronic kidney disease with heart failure and with stage 5 chronic kidney disease, or end stage renal disease: Secondary | ICD-10-CM | POA: Diagnosis present

## 2016-08-05 DIAGNOSIS — E8770 Fluid overload, unspecified: Secondary | ICD-10-CM | POA: Diagnosis not present

## 2016-08-05 DIAGNOSIS — E1142 Type 2 diabetes mellitus with diabetic polyneuropathy: Secondary | ICD-10-CM | POA: Diagnosis present

## 2016-08-05 DIAGNOSIS — I120 Hypertensive chronic kidney disease with stage 5 chronic kidney disease or end stage renal disease: Secondary | ICD-10-CM | POA: Diagnosis not present

## 2016-08-05 DIAGNOSIS — E1129 Type 2 diabetes mellitus with other diabetic kidney complication: Secondary | ICD-10-CM | POA: Diagnosis not present

## 2016-08-05 DIAGNOSIS — Z833 Family history of diabetes mellitus: Secondary | ICD-10-CM | POA: Diagnosis not present

## 2016-08-05 DIAGNOSIS — Z955 Presence of coronary angioplasty implant and graft: Secondary | ICD-10-CM | POA: Diagnosis not present

## 2016-08-05 DIAGNOSIS — I2584 Coronary atherosclerosis due to calcified coronary lesion: Secondary | ICD-10-CM | POA: Diagnosis not present

## 2016-08-05 DIAGNOSIS — I5042 Chronic combined systolic (congestive) and diastolic (congestive) heart failure: Secondary | ICD-10-CM | POA: Diagnosis not present

## 2016-08-05 DIAGNOSIS — E8779 Other fluid overload: Secondary | ICD-10-CM | POA: Diagnosis not present

## 2016-08-05 DIAGNOSIS — I200 Unstable angina: Secondary | ICD-10-CM | POA: Diagnosis not present

## 2016-08-05 DIAGNOSIS — F329 Major depressive disorder, single episode, unspecified: Secondary | ICD-10-CM | POA: Diagnosis present

## 2016-08-05 DIAGNOSIS — G4733 Obstructive sleep apnea (adult) (pediatric): Secondary | ICD-10-CM | POA: Diagnosis present

## 2016-08-05 DIAGNOSIS — I214 Non-ST elevation (NSTEMI) myocardial infarction: Secondary | ICD-10-CM | POA: Diagnosis not present

## 2016-08-05 DIAGNOSIS — R9431 Abnormal electrocardiogram [ECG] [EKG]: Secondary | ICD-10-CM | POA: Diagnosis not present

## 2016-08-05 DIAGNOSIS — Z841 Family history of disorders of kidney and ureter: Secondary | ICD-10-CM | POA: Diagnosis not present

## 2016-08-05 DIAGNOSIS — Z992 Dependence on renal dialysis: Secondary | ICD-10-CM

## 2016-08-05 DIAGNOSIS — R079 Chest pain, unspecified: Secondary | ICD-10-CM | POA: Diagnosis not present

## 2016-08-05 DIAGNOSIS — R0789 Other chest pain: Secondary | ICD-10-CM

## 2016-08-05 DIAGNOSIS — M109 Gout, unspecified: Secondary | ICD-10-CM | POA: Diagnosis present

## 2016-08-05 DIAGNOSIS — E8889 Other specified metabolic disorders: Secondary | ICD-10-CM | POA: Diagnosis present

## 2016-08-05 DIAGNOSIS — E1122 Type 2 diabetes mellitus with diabetic chronic kidney disease: Secondary | ICD-10-CM | POA: Diagnosis not present

## 2016-08-05 DIAGNOSIS — N186 End stage renal disease: Secondary | ICD-10-CM

## 2016-08-05 DIAGNOSIS — I959 Hypotension, unspecified: Secondary | ICD-10-CM | POA: Diagnosis not present

## 2016-08-05 DIAGNOSIS — I953 Hypotension of hemodialysis: Secondary | ICD-10-CM | POA: Diagnosis not present

## 2016-08-05 DIAGNOSIS — Z8546 Personal history of malignant neoplasm of prostate: Secondary | ICD-10-CM | POA: Diagnosis not present

## 2016-08-05 DIAGNOSIS — I25110 Atherosclerotic heart disease of native coronary artery with unstable angina pectoris: Secondary | ICD-10-CM | POA: Diagnosis present

## 2016-08-05 DIAGNOSIS — D631 Anemia in chronic kidney disease: Secondary | ICD-10-CM | POA: Diagnosis not present

## 2016-08-05 DIAGNOSIS — E785 Hyperlipidemia, unspecified: Secondary | ICD-10-CM | POA: Diagnosis present

## 2016-08-05 DIAGNOSIS — D649 Anemia, unspecified: Secondary | ICD-10-CM | POA: Diagnosis present

## 2016-08-05 LAB — TROPONIN I
TROPONIN I: 0.21 ng/mL — AB (ref <0.03)
TROPONIN I: 0.32 ng/mL — AB (ref <0.03)

## 2016-08-05 LAB — GLUCOSE, CAPILLARY
GLUCOSE-CAPILLARY: 230 mg/dL — AB (ref 65–99)
GLUCOSE-CAPILLARY: 192 mg/dL — AB (ref 65–99)
GLUCOSE-CAPILLARY: 145 mg/dL — AB (ref 65–99)
Glucose-Capillary: 116 mg/dL — ABNORMAL HIGH (ref 65–99)
Glucose-Capillary: 220 mg/dL — ABNORMAL HIGH (ref 65–99)

## 2016-08-05 MED ORDER — EZETIMIBE 10 MG PO TABS
10.0000 mg | ORAL_TABLET | Freq: Every day | ORAL | Status: DC
  Administered 2016-08-05 – 2016-08-08 (×3): 10 mg via ORAL
  Filled 2016-08-05 (×3): qty 1

## 2016-08-05 MED ORDER — SODIUM CHLORIDE 0.9 % WEIGHT BASED INFUSION
3.0000 mL/kg/h | INTRAVENOUS | Status: DC
  Administered 2016-08-06: 3 mL/kg/h via INTRAVENOUS

## 2016-08-05 MED ORDER — ASPIRIN 81 MG PO CHEW
81.0000 mg | CHEWABLE_TABLET | Freq: Every day | ORAL | Status: DC
  Administered 2016-08-05 – 2016-08-08 (×2): 81 mg via ORAL
  Filled 2016-08-05 (×2): qty 1

## 2016-08-05 MED ORDER — CITALOPRAM HYDROBROMIDE 20 MG PO TABS
10.0000 mg | ORAL_TABLET | Freq: Every day | ORAL | Status: DC
  Administered 2016-08-05 – 2016-08-08 (×3): 10 mg via ORAL
  Filled 2016-08-05 (×3): qty 1

## 2016-08-05 MED ORDER — RENA-VITE PO TABS
1.0000 | ORAL_TABLET | Freq: Every day | ORAL | Status: DC
  Administered 2016-08-05 – 2016-08-07 (×3): 1 via ORAL
  Filled 2016-08-05 (×3): qty 1

## 2016-08-05 MED ORDER — ONDANSETRON HCL 4 MG/2ML IJ SOLN: 4.0000 mg | Freq: Four times a day (QID) | INTRAMUSCULAR | Status: DC | PRN

## 2016-08-05 MED ORDER — NITROGLYCERIN 0.4 MG SL SUBL: 0.4000 mg | SUBLINGUAL_TABLET | SUBLINGUAL | Status: DC | PRN

## 2016-08-05 MED ORDER — LANTHANUM CARBONATE 500 MG PO CHEW
1000.0000 mg | CHEWABLE_TABLET | Freq: Two times a day (BID) | ORAL | Status: DC
  Administered 2016-08-05 – 2016-08-08 (×6): 1000 mg via ORAL
  Filled 2016-08-05 (×7): qty 2

## 2016-08-05 MED ORDER — COLESEVELAM HCL 625 MG PO TABS
1250.0000 mg | ORAL_TABLET | Freq: Two times a day (BID) | ORAL | Status: DC
  Administered 2016-08-05 – 2016-08-08 (×6): 1250 mg via ORAL
  Filled 2016-08-05 (×8): qty 2

## 2016-08-05 MED ORDER — HEPARIN (PORCINE) IN NACL 100-0.45 UNIT/ML-% IJ SOLN
1200.0000 [IU]/h | INTRAMUSCULAR | Status: DC
  Administered 2016-08-05: 1200 [IU]/h via INTRAVENOUS
  Filled 2016-08-05: qty 250

## 2016-08-05 MED ORDER — ASPIRIN 81 MG PO CHEW
81.0000 mg | CHEWABLE_TABLET | ORAL | Status: AC
  Administered 2016-08-06: 81 mg via ORAL
  Filled 2016-08-05: qty 1

## 2016-08-05 MED ORDER — SODIUM CHLORIDE 0.9 % IV SOLN: 250.0000 mL | INTRAVENOUS | Status: DC | PRN

## 2016-08-05 MED ORDER — CINACALCET HCL 30 MG PO TABS
60.0000 mg | ORAL_TABLET | Freq: Two times a day (BID) | ORAL | Status: DC
  Administered 2016-08-05 – 2016-08-08 (×6): 60 mg via ORAL
  Filled 2016-08-05 (×7): qty 2

## 2016-08-05 MED ORDER — ALLOPURINOL 100 MG PO TABS
100.0000 mg | ORAL_TABLET | Freq: Every day | ORAL | Status: DC
  Administered 2016-08-05 – 2016-08-08 (×3): 100 mg via ORAL
  Filled 2016-08-05 (×3): qty 1

## 2016-08-05 MED ORDER — HEPARIN BOLUS VIA INFUSION
2000.0000 [IU] | Freq: Once | INTRAVENOUS | Status: AC
  Administered 2016-08-05: 2000 [IU] via INTRAVENOUS
  Filled 2016-08-05: qty 2000

## 2016-08-05 MED ORDER — SODIUM CHLORIDE 0.9 % WEIGHT BASED INFUSION: 1.0000 mL/kg/h | INTRAVENOUS | Status: DC

## 2016-08-05 MED ORDER — GABAPENTIN 300 MG PO CAPS
300.0000 mg | ORAL_CAPSULE | Freq: Every day | ORAL | Status: DC
  Administered 2016-08-05 – 2016-08-07 (×3): 600 mg via ORAL
  Filled 2016-08-05 (×3): qty 2

## 2016-08-05 MED ORDER — RENA-VITE PO TABS: 1.0000 | ORAL_TABLET | Freq: Every day | ORAL | Status: DC

## 2016-08-05 MED ORDER — ACETAMINOPHEN 325 MG PO TABS: 650.0000 mg | ORAL_TABLET | ORAL | Status: DC | PRN

## 2016-08-05 MED ORDER — INSULIN ASPART 100 UNIT/ML ~~LOC~~ SOLN
0.0000 [IU] | Freq: Three times a day (TID) | SUBCUTANEOUS | Status: DC
  Administered 2016-08-05: 2 [IU] via SUBCUTANEOUS
  Administered 2016-08-05: 1 [IU] via SUBCUTANEOUS
  Administered 2016-08-06: 3 [IU] via SUBCUTANEOUS
  Administered 2016-08-06 – 2016-08-08 (×3): 1 [IU] via SUBCUTANEOUS

## 2016-08-05 NOTE — Progress Notes (Signed)
CRITICAL VALUE ALERT  Critical value received:  Troponin  Date of notification:  08/05/2016  Time of notification:  4098  Critical value read back:Yes  Nurse who received alert: Rebecka Apley RN   MD notified (1st page): Dr. Tana Coast  Time of first page: 0445  MD notified (2nd page):  Time of second page:  Responding MD:  Dr. Tana Coast  Time MD responded:  [phone]

## 2016-08-05 NOTE — Progress Notes (Signed)
PROGRESS NOTE    [name]  OMV:[id number] DOB: 06-Apr-1951 DOA: 08/04/2016 PCP: [name], MD   Chief Complaint  Patient presents with  . Chest Pain    Brief Narrative:  HPI on 08/05/2016 by Dr. Lily Kocher [name] is a 65 y.o. gentleman with a history of combined systolic and diastolic heart failure, ESRD on HD T/Th/Sat, HTN, DM, and CAD with DES to RCA who has had recurrent problems with hypotension, particularly on dialysis days.  He has had to come off all of his anti-hypertensives, and he now takes midodrine on HD days and prn for systolic blood pressures less than 100.  Today, he developed chest pain after routine HD.  He reports a history of recurrent intermittent chest pain, but he became worried today when symptoms lasted longer than usual (about one hour total) and were associated with low blood pressures (his wife documented systolic blood pressure of 70 at home; the patient did not take an additional dose of midodrine at that time).  His wife called EMS, and the patient was brought to the ED for further evaluation.  He took four baby aspirin prior to arrival.  The patient's chest pain has not been associated with any shortness of breath, light-headedness, diaphoresis, or near-syncope.  He has felt generalized weakness but he has not fallen.  No nausea or vomiting.  No swelling. Assessment & Plan   Chest pain/Elevated troponin -Occurred with dialysis and after straining during a bowel movement. ?related to hypotension -Troponin 0.21, 0.32 -EKG showed lateral ST segment depression -Cardiology consulted and appreciated- ?LHC on 11/6 -Spoke with Dr. [name], at the request of the patient.  Patient very reluctant with the use of heparin or any other blood thinners due to history of GI bleeding and anemia in the past. -Continue to monitor troponins -patient currently chest pain free  ESRD  -Hemodialysis on TTS -Patient cut short his HD treatment on 11/4 by 12 minutes.   States he is very compliant with HD.  Diabetes mellitus, type II with neuropathy -Continue ISS and CBG monitoring -Restarted gabapentin -Repaglinide held  Depression -Continue celexa  Gout -Continue allopurinol   DVT Prophylaxis  SCDs  Code Status: Full  Family Communication: None at bedside  Disposition Plan: Observation. ?LHC on11/6.   Consultants Cardiology  Procedures  None  Antibiotics   Anti-infectives    None      Subjective:   [name] seen and examined today.  Patient denies current chest pain. Denies shortness of breath, abdominal pain, N/V/D/C, dizziness, headache.  States his blood pressure was low yesterday.  Objective:   Vitals:   08/05/16 0040 08/05/16 0042 08/05/16 0500 08/05/16 0834  BP:  (!) 161/65 110/72 123/67  Pulse:  73 84 87  Resp:  16 14   Temp:  97.9 F (36.6 C) 98 F (36.7 C) 98.6 F (37 C)  TempSrc:  Oral  Oral  SpO2:  99% 94% 94%  Weight: 105 kg (231 lb 8 oz)     Height: 6' (1.829 m)       Intake/Output Summary (Last 24 hours) at 08/05/16 1221 Last data filed at 08/04/16 2346  Gross per 24 hour  Intake              500 ml  Output                0 ml  Net              500 ml  Filed Weights   08/04/16 2027 08/05/16 0040  Weight: 106.6 kg (235 lb) 105 kg (231 lb 8 oz)    Exam  General: Well developed, well nourished, NAD, appears stated age  HEENT: NCAT,  mucous membranes moist.   Neck: Supple, no JVD, no masses  Cardiovascular: S1 S2 auscultated, no rubs, murmurs or gallops. Regular rate and rhythm.  Respiratory: Clear to auscultation bilaterally with equal chest rise  Abdomen: Soft, nontender, nondistended, + bowel sounds  Extremities: warm dry without cyanosis clubbing or edema  Neuro: AAOx3, nonfocal  Skin: Without rashes exudates or nodules  Psych: Normal affect and demeanor with intact judgement and insight   Data Reviewed: I have personally reviewed following labs and imaging  studies  CBC:  Recent Labs Lab 08/04/16 2115 08/04/16 2125  WBC 7.1  --   NEUTROABS 4.7  --   HGB 12.2* 13.3  HCT 37.8* 39.0  MCV 95.7  --   PLT 256  --    Basic Metabolic Panel:  Recent Labs Lab 08/04/16 2115 08/04/16 2125  NA 137 141  K 4.3 4.4  CL 98* 101  CO2 26  --   GLUCOSE 163* 161*  BUN 20 25*  CREATININE 6.66* 6.50*  CALCIUM 8.8*  --    GFR: Estimated Creatinine Clearance: 13.8 mL/min (by C-G formula based on SCr of 6.5 mg/dL (H)). Liver Function Tests:  Recent Labs Lab 08/04/16 2115  AST 19  ALT 18  ALKPHOS 110  BILITOT 0.7  PROT 7.6  ALBUMIN 3.4*   No results for input(s): LIPASE, AMYLASE in the last 168 hours. No results for input(s): AMMONIA in the last 168 hours. Coagulation Profile: No results for input(s): INR, PROTIME in the last 168 hours. Cardiac Enzymes:  Recent Labs Lab 08/05/16 0152 08/05/16 0717  TROPONINI 0.21* 0.32*   BNP (last 3 results) No results for input(s): PROBNP in the last 8760 hours. HbA1C: No results for input(s): HGBA1C in the last 72 hours. CBG:  Recent Labs Lab 08/05/16 0136 08/05/16 0737 08/05/16 1125  GLUCAP 230* 116* 192*   Lipid Profile: No results for input(s): CHOL, HDL, LDLCALC, TRIG, CHOLHDL, LDLDIRECT in the last 72 hours. Thyroid Function Tests: No results for input(s): TSH, T4TOTAL, FREET4, T3FREE, THYROIDAB in the last 72 hours. Anemia Panel: No results for input(s): VITAMINB12, FOLATE, FERRITIN, TIBC, IRON, RETICCTPCT in the last 72 hours. Urine analysis:    Component Value Date/Time   COLORURINE LT. YELLOW 06/26/2013 1200   APPEARANCEUR CLEAR 06/26/2013 1200   LABSPEC 1.020 06/26/2013 1200   PHURINE 8.5 06/26/2013 1200   GLUCOSEU 100 06/26/2013 1200   HGBUR MODERATE 06/26/2013 1200   BILIRUBINUR NEGATIVE 06/26/2013 1200   KETONESUR NEGATIVE 06/26/2013 1200   PROTEINUR >300 (A) 01/04/2010 1032   UROBILINOGEN 0.2 06/26/2013 1200   NITRITE NEGATIVE 06/26/2013 1200   LEUKOCYTESUR  TRACE 06/26/2013 1200   Sepsis Labs: @LABRCNTIP (procalcitonin:4,lacticidven:4)  )No results found for this or any previous visit (from the past 240 hour(s)).    Radiology Studies: Dg Chest 2 View  Result Date: 08/04/2016 CLINICAL DATA:  Acute onset of substernal chest pain and hypotension. Initial encounter. EXAM: CHEST  2 VIEW COMPARISON:  Chest radiograph from 06/18/2016 FINDINGS: The lungs are well-aerated. Vascular congestion is noted. Increased interstitial markings may reflect mild interstitial edema. There is no evidence of pleural effusion or pneumothorax. The heart is normal in size; the mediastinal contour is within normal limits. No acute osseous abnormalities are seen. IMPRESSION: Vascular congestion noted. Increased interstitial markings  may reflect mild interstitial edema. Electronically Signed   By: Garald Balding M.D.   On: 08/04/2016 21:47     Scheduled Meds: . allopurinol  100 mg Oral Daily  . aspirin  81 mg Oral Daily  . cinacalcet  60 mg Oral BID WC  . citalopram  10 mg Oral Daily  . colesevelam  1,250 mg Oral BID WC  . ezetimibe  10 mg Oral Daily  . gabapentin  300-600 mg Oral QHS  . insulin aspart  0-9 Units Subcutaneous TID WC  . lanthanum  1,000 mg Oral BID WC  . multivitamin  1 tablet Oral QHS   Continuous Infusions:   LOS: 0 days   Time Spent in minutes   30 minutes  Chang Tiggs D.O. on 08/05/2016 at 12:21 PM  Between 7am to 7pm - Pager - [phone]  After 7pm go to [email or web address] - password [email or web address]  And look for the night coverage person covering for me after hours  [facility] Office  [phone]

## 2016-08-05 NOTE — Progress Notes (Signed)
ANTICOAGULATION CONSULT NOTE - Initial Consult  Pharmacy Consult for heparin Indication: chest pain/ACS  Allergies  Allergen Reactions  . Cephalexin Swelling and Other (See Comments)    Tongue swelling  . Statins Other (See Comments)    Weak muscles  . Ciprofloxacin Rash    Patient Measurements: Height: 6' (182.9 cm) Weight: 231 lb 8 oz (105 kg) IBW/kg (Calculated) : 77.6 Heparin Dosing Weight: 99 kg  Vital Signs: Temp: 98.6 F (37 C) (11/05 0834) Temp Source: Oral (11/05 0834) BP: 123/67 (11/05 0834) Pulse Rate: 87 (11/05 0834)  Labs:  Recent Labs  08/04/16 2115 08/04/16 2125 08/05/16 0152 08/05/16 0717  HGB 12.2* 13.3  --   --   HCT 37.8* 39.0  --   --   PLT 256  --   --   --   CREATININE 6.66* 6.50*  --   --   TROPONINI  --   --  0.21* 0.32*    Estimated Creatinine Clearance: 13.8 mL/min (by C-G formula based on SCr of 6.5 mg/dL (H)).  Assessment: 65 yo male admitted with chest pain. History of CAD with DES to RCA and ESRD (HD on T/Th/Sat). Plan for heart cath Monday 11/6. CBC stable and platelets within normal limits. No s/s bleeding noted. Conservative bolus in setting of ESRD.   Goal of Therapy:  Heparin level 0.3-0.7 units/ml Monitor platelets by anticoagulation protocol: Yes   Plan:  Heparin 2000 units x1  Start heparin infusion at 1200 units/hr Check anti-Xa level in 8 hours and daily while on heparin Continue to monitor H&H and platelets daily  Monitor for s/s bleeding   Argie Ramming, PharmD Pharmacy Resident  Pager [phone] 08/05/16 8:54 AM

## 2016-08-05 NOTE — Consult Note (Signed)
Cardiology Consult    Patient ID: [name] MRN: [id number], DOB/AGE: 67-Jun-1952   Admit date: 08/04/2016 Date of Consult: 08/05/2016  Primary Physician: [name], MD Primary Cardiologist: [place]  Patient Profile    Patient is a 65 year old male with a past medical history significant for ESRD on HD T/TH/Sat, gout, diabetes mellitus type 2, HTN, and HLD; and CAD withpci to RCA in 2012. Preserved LVEF on TTE in 12/2014. Repeat stress in 05/2016 was negative.  Developed CP after HD. During HD his BP dropped to 70's syst. BP drop with HD has ve been a more recent phenomenon. He was given fluid back through the machine. He then developed C when he went to the bathroom to have a bowel movement. Pain lasted for 30 min or so. Radiating to left arm.      IN the ED he had a abnormal [facility] with lateral ST depressions that resolved with more fluid repletion. Lactate elevated. Initial trop negative burt eventually turned +.   Past Medical History   Past Medical History:  Diagnosis Date  . Allergic rhinitis, cause unspecified 02/24/2014  . Anemia 06/16/2011  . BENIGN PROSTATIC HYPERTROPHY 10/14/2009  . CAD, NATIVE VESSEL 02/06/2009   saw Dr. Missy Sabins last jan 2013  . Cervical radiculopathy, chronic 02/23/2016   Right c5-6 by NCS/EMG  . CHEST PAIN 03/29/2010  . COLONIC POLYPS, HX OF 10/14/2009  . CONGESTIVE HEART FAILURE 06/18/2007  . Dementia [id number]  . DEPRESSION 10/14/2009  . Depression 09/24/2015  . DIABETES MELLITUS, TYPE II 02/01/2010  . DIASTOLIC HEART FAILURE, CHRONIC 02/06/2009  . DIZZINESS 07/17/2010  . DYSLIPIDEMIA 06/18/2007  . DYSPNEA 10/29/2008  . ESRD (end stage renal disease) on dialysis ([place]) 08/04/2010   "TTS;  " (04/18/2015)  . FOOT PAIN 08/12/2008  . GAIT DISTURBANCE 03/03/2010  . GASTROENTERITIS, VIRAL 10/14/2009  . GERD 06/18/2007  . GOITER, MULTINODULAR 12/26/2007  . GOUT 06/18/2007  . GYNECOMASTIA 07/17/2010  . Hemodialysis access, fistula mature [facility])    Dialysis T-Th-Sa  ([place]) Right upper arm fistula  . Hyperlipidemia 10/16/2011  . Hyperparathyroidism, secondary ([place]) 06/16/2011  . HYPERTENSION 06/18/2007  . Hyperthyroidism   . HYPERTHYROIDISM 02/02/2010  . Hypocalcemia 06/07/2010  . Ischemic cardiomyopathy 06/16/2011  . NECK PAIN 07/31/2010  . ONYCHOMYCOSIS, TOENAILS 12/26/2007  . OSA on CPAP 10/16/2011  . Other malaise and fatigue 11/24/2009  . PERIPHERAL NEUROPATHY 06/18/2007  . Prostate cancer ([place])   . PULMONARY NODULE, RIGHT LOWER LOBE 06/08/2009  . Renal insufficiency   . Sleep apnea    cpap machine and o2  . TRANSAMINASES, SERUM, ELEVATED 02/01/2010  . Transfusion history    none recent  . Unspecified hypotension 01/30/2010    Past Surgical History:  Procedure Laterality Date  . ARTERIOVENOUS GRAFT PLACEMENT Right 2009   forearm/notes 02/01/2011  . AV FISTULA PLACEMENT  11/07/2011   Procedure: INSERTION OF ARTERIOVENOUS (AV) GORE-TEX GRAFT ARM;  Surgeon: Tinnie Gens, MD;  Location: [place];  Service: Vascular;  Laterality: Left;  . BACK SURGERY  1998  . BASCILIC VEIN TRANSPOSITION Right 02/27/2013   Procedure: BASCILIC VEIN TRANSPOSITION;  Surgeon: Mal Misty, MD;  Location: [place];  Service: Vascular;  Laterality: Right;  Right Basilic Vein Transposition   . CERVICAL SPINE SURGERY  2/09   "to repair nerve problems in my left arm"  . CHOLECYSTECTOMY    . CORONARY ANGIOPLASTY WITH STENT PLACEMENT  06/11/2008  . CORONARY ANGIOPLASTY WITH STENT PLACEMENT  06/2007   TAXUS  stent to RCA/notes 01/31/2011  . ESOPHAGOGASTRODUODENOSCOPY  09/28/2011   Procedure: ESOPHAGOGASTRODUODENOSCOPY (EGD);  Surgeon: Missy Sabins, MD;  Location: [facility] ENDOSCOPY;  Service: Endoscopy;  Laterality: N/A;  . ESOPHAGOGASTRODUODENOSCOPY N/A 04/07/2015   Procedure: ESOPHAGOGASTRODUODENOSCOPY (EGD);  Surgeon: Teena Irani, MD;  Location: [facility] ENDOSCOPY;  Service: Endoscopy;  Laterality: N/A;  . ESOPHAGOGASTRODUODENOSCOPY N/A 04/19/2015   Procedure: ESOPHAGOGASTRODUODENOSCOPY  (EGD);  Surgeon: Arta Silence, MD;  Location: [facility] ENDOSCOPY;  Service: Endoscopy;  Laterality: N/A;  . FOREIGN BODY REMOVAL  09/2003   via upper endoscopy/notes 02/12/2011  . GIVENS CAPSULE STUDY  09/30/2011   Procedure: GIVENS CAPSULE STUDY;  Surgeon: Jeryl Columbia, MD;  Location: [facility] ENDOSCOPY;  Service: Endoscopy;  Laterality: N/A;  . INSERTION OF DIALYSIS CATHETER Right 2014  . INSERTION OF DIALYSIS CATHETER Left 02/11/2013   Procedure: INSERTION OF DIALYSIS CATHETER;  Surgeon: Conrad Canfield, MD;  Location: [place];  Service: Vascular;  Laterality: Left;  Ultrasound guided  . REMOVAL OF A DIALYSIS CATHETER Right 02/11/2013   Procedure: REMOVAL OF A DIALYSIS CATHETER;  Surgeon: Conrad [place], MD;  Location: [place];  Service: Vascular;  Laterality: Right;  . SAVORY DILATION N/A 04/07/2015   Procedure: SAVORY DILATION;  Surgeon: Teena Irani, MD;  Location: [facility] ENDOSCOPY;  Service: Endoscopy;  Laterality: N/A;  . SHUNTOGRAM N/A 09/20/2011   Procedure: Earney Mallet;  Surgeon: Conrad [place], MD;  Location: [facility] CATH LAB;  Service: Cardiovascular;  Laterality: N/A;  . TONSILLECTOMY    . TOTAL KNEE ARTHROPLASTY Right 08/02/2015   Procedure: TOTAL KNEE ARTHROPLASTY;  Surgeon: Renette Butters, MD;  Location: [place];  Service: Orthopedics;  Laterality: Right;  . VENOGRAM N/A 01/26/2013   Procedure: VENOGRAM;  Surgeon: Angelia Mould, MD;  Location: [facility] CATH LAB;  Service: Cardiovascular;  Laterality: N/A;     Allergies  Allergies  Allergen Reactions  . Cephalexin Swelling and Other (See Comments)    Tongue swelling  . Statins Other (See Comments)    Weak muscles  . Ciprofloxacin Rash     Inpatient Medications    . aspirin  81 mg Oral Daily  . cinacalcet  60 mg Oral BID WC  . colesevelam  1,250 mg Oral BID WC  . insulin aspart  0-9 Units Subcutaneous TID WC  . lanthanum  1,000 mg Oral BID WC    Family History    Family History  Problem Relation Age of Onset  . Heart disease Sister   . Heart  disease Father   . Diabetes Father   . Kidney failure Father   . Hypertension Father   . Cancer Neg Hx     Social History    Social History   Social History  . Marital status: Married    Spouse name: N/A  . Number of children: 3  . Years of education: N/A   Occupational History  . 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    Social History Main Topics  . Smoking status: Former Smoker    Packs/day: 1.00    Years: 25.00    Types: Cigarettes    Quit date: 10/01/2005  . Smokeless tobacco: Former Systems developer    Quit date: 10/01/2005     Comment: Quit smoking 2007 Smoked x 25 years 1/2 ppd.  . Alcohol use No  . Drug use: No  . Sexual activity: No   Other Topics Concern  . Not on file   Social History Narrative  . No narrative on file  Review of Systems    General:  No chills, fever, night sweats or weight changes.  Cardiovascular:  No chest pain, dyspnea on exertion, edema, orthopnea, palpitations, paroxysmal nocturnal dyspnea. Dermatological: No rash, lesions/masses Respiratory: No cough, dyspnea Urologic: No hematuria, dysuria Abdominal:   No nausea, vomiting, diarrhea, bright red blood per rectum, melena, or hematemesis Neurologic:  No visual changes, wkns, changes in mental status. All other systems reviewed and are otherwise negative except as noted above.  Physical Exam    Blood pressure 110/72, pulse 84, temperature 98 F (36.7 C), resp. rate 14, height 6' (1.829 m), weight 105 kg (231 lb 8 oz), SpO2 94 %.  General: Pleasant, NAD Psych: Normal affect. Neuro: Alert and oriented X 3. Moves all extremities spontaneously. HEENT: Normal  Neck: Supple without bruits or JVD. Lungs:  Resp regular and unlabored, CTA. Heart: RRR no s3, s4, or murmurs. Abdomen: Soft, non-tender, non-distended, BS + x 4.  Extremities: No clubbing, cyanosis or edema. DP/PT/Radials 2+ and equal bilaterally.  Labs    Troponin [facility] Test)  Recent Labs   08/04/16 2124  TROPIPOC 0.07    Recent Labs  08/05/16 0152  TROPONINI 0.21*   Lab Results  Component Value Date   WBC 7.1 08/04/2016   HGB 13.3 08/04/2016   HCT 39.0 08/04/2016   MCV 95.7 08/04/2016   PLT 256 08/04/2016    Recent Labs Lab 08/04/16 2115 08/04/16 2125  NA 137 141  K 4.3 4.4  CL 98* 101  CO2 26  --   BUN 20 25*  CREATININE 6.66* 6.50*  CALCIUM 8.8*  --   PROT 7.6  --   BILITOT 0.7  --   ALKPHOS 110  --   ALT 18  --   AST 19  --   GLUCOSE 163* 161*   Lab Results  Component Value Date   CHOL 263 (H) 07/16/2014   HDL 27.90 (L) 07/16/2014   LDLCALC 185 (H) 01/13/2014   TRIG (H) 07/16/2014    409.0 Triglyceride is over 400; calculations on Lipids are invalid.   Lab Results  Component Value Date   DDIMER (H) 11/28/2007    2.76        AT THE INHOUSE ESTABLISHED CUTOFF VALUE OF 0.48 ug/mL FEU, THIS ASSAY HAS BEEN DOCUMENTED IN THE LITERATURE TO HAVE     Radiology Studies    Dg Chest 2 View  Result Date: 08/04/2016 CLINICAL DATA:  Acute onset of substernal chest pain and hypotension. Initial encounter. EXAM: CHEST  2 VIEW COMPARISON:  Chest radiograph from 06/18/2016 FINDINGS: The lungs are well-aerated. Vascular congestion is noted. Increased interstitial markings may reflect mild interstitial edema. There is no evidence of pleural effusion or pneumothorax. The heart is normal in size; the mediastinal contour is within normal limits. No acute osseous abnormalities are seen. IMPRESSION: Vascular congestion noted. Increased interstitial markings may reflect mild interstitial edema. Electronically Signed   By: Garald Balding M.D.   On: 08/04/2016 21:47    ECG & Cardiac Imaging    NSR with infero lateral ST depression.   Assessment & Plan    Mr Salm is a 69 y o man with a aPMH of HTN, ESRD and CAD. Now with NSTEMI in setting of HD with hypotension.   Recommendations: - Heparin drip - [facility] on Monday - Keep npo overnight - Will notify Dr.  Zoila Shutter of his admission  Signed, Cristina Gong, MD 08/05/2016, 6:17 AM

## 2016-08-05 NOTE — H&P (Signed)
History and Physical    [name] DOB: 1951-07-25 DOA: 08/04/2016  PCP: [name], MD  Cardiology: Bensimhon  Patient coming from: Home  Chief Complaint: chest pain, low blood pressure  HPI: [name] is a 65 y.o. gentleman with a history of combined systolic and diastolic heart failure, ESRD on HD T/Th/Sat, HTN, DM, and CAD with DES to RCA who has had recurrent problems with hypotension, particularly on dialysis days.  He has had to come off all of his anti-hypertensives, and he now takes midodrine on HD days and prn for systolic blood pressures less than 100.  Today, he developed chest pain after routine HD.  He reports a history of recurrent intermittent chest pain, but he became worried today when symptoms lasted longer than usual (about one hour total) and were associated with low blood pressures (his wife documented systolic blood pressure of 70 at home; the patient did not take an additional dose of midodrine at that time).  His wife called EMS, and the patient was brought to the ED for further evaluation.  He took four baby aspirin prior to arrival.  The patient's chest pain has not been associated with any shortness of breath, light-headedness, diaphoresis, or near-syncope.  He has felt generalized weakness but he has not fallen.  No nausea or vomiting.  No swelling.  ED Course: Systolic blood pressure low 100's in the ED but EKG concerning for ST depressions in the inferior leads that resolved after 500cc NS bolus and improved blood pressures (systolic 950'D).  These dynamic EKG changes are concerning for underlying ischemia.  Observation recommended.  Patient agreeable.  Cardiology consult also requested.    Of note, the patient had a myocardial perfusion study in August.  Findings:  Nuclear stress EF: 50%.  There was no ST segment deviation noted during stress.  No T wave inversion was noted during stress.  Defect 1: There is a small defect of mild severity  present in the apex location.  This is a low risk study.  The left ventricular ejection fraction is mildly decreased (45-54%).   Low risk stress nuclear study with apical thinning, otherwise normal perfusion and mildly depressed global systolic function.  Review of Systems: As per HPI otherwise 10 point review of systems negative.    Past Medical History:  Diagnosis Date  . Allergic rhinitis, cause unspecified 02/24/2014  . Anemia 06/16/2011  . BENIGN PROSTATIC HYPERTROPHY 10/14/2009  . CAD, NATIVE VESSEL 02/06/2009   saw Dr. Missy Sabins last jan 2013  . Cervical radiculopathy, chronic 02/23/2016   Right c5-6 by NCS/EMG  . CHEST PAIN 03/29/2010  . COLONIC POLYPS, HX OF 10/14/2009  . CONGESTIVE HEART FAILURE 06/18/2007  . Dementia [id number]  . DEPRESSION 10/14/2009  . Depression 09/24/2015  . DIABETES MELLITUS, TYPE II 02/01/2010  . DIASTOLIC HEART FAILURE, CHRONIC 02/06/2009  . DIZZINESS 07/17/2010  . DYSLIPIDEMIA 06/18/2007  . DYSPNEA 10/29/2008  . ESRD (end stage renal disease) on dialysis ([place]) 08/04/2010   "TTS;  " (04/18/2015)  . FOOT PAIN 08/12/2008  . GAIT DISTURBANCE 03/03/2010  . GASTROENTERITIS, VIRAL 10/14/2009  . GERD 06/18/2007  . GOITER, MULTINODULAR 12/26/2007  . GOUT 06/18/2007  . GYNECOMASTIA 07/17/2010  . Hemodialysis access, fistula mature [facility])    Dialysis T-Th-Sa ([place]) Right upper arm fistula  . Hyperlipidemia 10/16/2011  . Hyperparathyroidism, secondary ([place]) 06/16/2011  . HYPERTENSION 06/18/2007  . Hyperthyroidism   . HYPERTHYROIDISM 02/02/2010  . Hypocalcemia 06/07/2010  . Ischemic cardiomyopathy  06/16/2011  . NECK PAIN 07/31/2010  . ONYCHOMYCOSIS, TOENAILS 12/26/2007  . OSA on CPAP 10/16/2011  . Other malaise and fatigue 11/24/2009  . PERIPHERAL NEUROPATHY 06/18/2007  . Prostate cancer ([place])   . PULMONARY NODULE, RIGHT LOWER LOBE 06/08/2009  . Renal insufficiency   . Sleep apnea    cpap machine and o2  . TRANSAMINASES, SERUM, ELEVATED 02/01/2010  .  Transfusion history    none recent  . Unspecified hypotension 01/30/2010    Past Surgical History:  Procedure Laterality Date  . ARTERIOVENOUS GRAFT PLACEMENT Right 2009   forearm/notes 02/01/2011  . AV FISTULA PLACEMENT  11/07/2011   Procedure: INSERTION OF ARTERIOVENOUS (AV) GORE-TEX GRAFT ARM;  Surgeon: Tinnie Gens, MD;  Location: [place];  Service: Vascular;  Laterality: Left;  . BACK SURGERY  1998  . BASCILIC VEIN TRANSPOSITION Right 02/27/2013   Procedure: BASCILIC VEIN TRANSPOSITION;  Surgeon: Mal Misty, MD;  Location: [place];  Service: Vascular;  Laterality: Right;  Right Basilic Vein Transposition   . CERVICAL SPINE SURGERY  2/09   "to repair nerve problems in my left arm"  . CHOLECYSTECTOMY    . CORONARY ANGIOPLASTY WITH STENT PLACEMENT  06/11/2008  . CORONARY ANGIOPLASTY WITH STENT PLACEMENT  06/2007   TAXUS stent to RCA/notes 01/31/2011  . ESOPHAGOGASTRODUODENOSCOPY  09/28/2011   Procedure: ESOPHAGOGASTRODUODENOSCOPY (EGD);  Surgeon: Missy Sabins, MD;  Location: [facility] ENDOSCOPY;  Service: Endoscopy;  Laterality: N/A;  . ESOPHAGOGASTRODUODENOSCOPY N/A 04/07/2015   Procedure: ESOPHAGOGASTRODUODENOSCOPY (EGD);  Surgeon: Teena Irani, MD;  Location: [facility] ENDOSCOPY;  Service: Endoscopy;  Laterality: N/A;  . ESOPHAGOGASTRODUODENOSCOPY N/A 04/19/2015   Procedure: ESOPHAGOGASTRODUODENOSCOPY (EGD);  Surgeon: Arta Silence, MD;  Location: [facility] ENDOSCOPY;  Service: Endoscopy;  Laterality: N/A;  . FOREIGN BODY REMOVAL  09/2003   via upper endoscopy/notes 02/12/2011  . GIVENS CAPSULE STUDY  09/30/2011   Procedure: GIVENS CAPSULE STUDY;  Surgeon: Jeryl Columbia, MD;  Location[facility] END[facility]COPY;  Service: Endoscopy;  Laterality: N/A;  . INSERTION OF DIALYSIS CATHETER Right 2014  . INSERTION OF DIALYSIS CATHETER Left 02/11/2013   Procedure: INSERTION OF DIALYSIS CATHETER;  Surgeon: Conrad [place], MD;  Location: [place];  Service: Vascular;  Laterality: Left;  Ultrasound guided  . REMOVAL OF A DIALYSIS CATHETER Right  02/11/2013   Procedure: REMOVAL OF A DIALYSIS CATHETER;  Surgeon: Conrad Colton, MD;  Location: [place];  Service: Vascular;  Laterality: Right;  . SAVORY DILATION N/A 04/07/2015   Procedure: SAVORY DILATION;  Surgeon: Teena Irani, MD;  Location: [facility] ENDOSCOPY;  Service: Endoscopy;  Laterality: N/A;  . SHUNTOGRAM N/A 09/20/2011   Procedure: Earney Mallet;  Surgeon: Conrad [place], MD;  Location: [facility] CATH LAB;  Service: Cardiovascular;  Laterality: N/A;  . TONSILLECTOMY    . TOTAL KNEE ARTHROPLASTY Right 08/02/2015   Procedure: TOTAL KNEE ARTHROPLASTY;  Surgeon: Renette Butters, MD;  Location: [place];  Service: Orthopedics;  Laterality: Right;  . VENOGRAM N/A 01/26/2013   Procedure: VENOGRAM;  Surgeon: Angelia Mould, MD;  Location: [facility] CATH LAB;  Service: Cardiovascular;  Laterality: N/A;     reports that he quit smoking about 10 years ago. His smoking use included Cigarettes. He has a 25.00 pack-year smoking history. He quit smokeless tobacco use about 10 years ago. He reports that he does not drink alcohol or use drugs.  He is married.  Allergies  Allergen Reactions  . Cephalexin Swelling and Other (See Comments)    Tongue swelling  . Statins Other (See Comments)  Weak muscles  . Ciprofloxacin Rash    Family History  Problem Relation Age of Onset  . Heart disease Sister   . Heart disease Father   . Diabetes Father   . Kidney failure Father   . Hypertension Father   . Cancer Neg Hx      Prior to Admission medications   Medication Sig Start Date End Date Taking? Authorizing Provider  allopurinol (ZYLOPRIM) 100 MG tablet Take 1 tablet (100 mg total) by mouth daily. 07/20/16  Yes Biagio Borg, MD  aspirin 81 MG tablet Take 81 mg by mouth daily.   Yes Historical Provider, MD  cinacalcet (SENSIPAR) 60 MG tablet Take 60 mg by mouth 2 (two) times daily.    Yes Historical Provider, MD  colesevelam (WELCHOL) 625 MG tablet TAKE THREE TABLETS BY MOUTH TWICE DAILY WITH MEALS Patient taking  differently: Take 1,250 mg by mouth 2 (two) times daily with a meal. TAKE THREE TABLETS BY MOUTH TWICE DAILY WITH MEALS 07/20/16  Yes Biagio Borg, MD  ezetimibe (ZETIA) 10 MG tablet Take 1 tablet (10 mg total) by mouth daily. 03/15/16  Yes Jolaine Artist, MD  gabapentin (NEURONTIN) 300 MG capsule 1-2 tabs by mouth at bedtime for sleep and pain Patient taking differently: Take 600 mg by mouth at bedtime. 1-2 tabs by mouth at bedtime for sleep and pain 07/20/16  Yes Biagio Borg, MD  lanthanum (FOSRENOL) 1000 MG chewable tablet Chew 1,000 mg by mouth 2 (two) times daily with a meal. Reported on 12/23/2015   Yes Historical Provider, MD  midodrine (PROAMATINE) 2.5 MG tablet Take 1 tablet (2.5 mg total) by mouth 2 (two) times daily with a meal. ONLY ON HD DAY OR FOR BP LESS THAN 90 04/25/16  Yes Jolaine Artist, MD  multivitamin (RENA-VIT) TABS tablet Take 1 tablet by mouth daily. Reported on 10/24/2015   Yes Historical Provider, MD  repaglinide (PRANDIN) 0.5 MG tablet Take 1 tablet (0.5 mg total) by mouth daily with breakfast. 06/19/16  Yes Renato Shin, MD  acetaminophen (TYLENOL) 325 MG tablet Take 1 tablet (325 mg total) by mouth every 4 (four) hours as needed for headache or mild pain. Patient not taking: Reported on 08/04/2016 08/10/15   Silver Huguenin Elgergawy, MD  citalopram (CELEXA) 10 MG tablet Take 1 tablet (10 mg total) by mouth daily. 09/21/15   Biagio Borg, MD    Physical Exam: Vitals:   08/04/16 2330 08/05/16 0000 08/05/16 0040 08/05/16 0042  BP: 128/73 110/75  (!) 161/65  Pulse: 77 76  73  Resp: 14 17  16   Temp:    97.9 F (36.6 C)  TempSrc:    Oral  SpO2: 98% 97%  99%  Weight:   105 kg (231 lb 8 oz)   Height:   6' (1.829 m)       Constitutional: NAD, calm, comfortable, NONtoxic appearing.  Chest pain free now. Vitals:   08/04/16 2330 08/05/16 0000 08/05/16 0040 08/05/16 0042  BP: 128/73 110/75  (!) 161/65  Pulse: 77 76  73  Resp: 14 17  16   Temp:    97.9 F (36.6 C)    TempSrc:    Oral  SpO2: 98% 97%  99%  Weight:   105 kg (231 lb 8 oz)   Height:   6' (1.829 m)    Eyes: PERRL, lids and conjunctivae normal ENMT: Mucous membranes are moist. Posterior pharynx clear of any exudate or lesions. Normal dentition.  Neck:  normal appearance, supple Respiratory: clear to auscultation bilaterally, no wheezing, no crackles. Normal respiratory effort. No accessory muscle use.  Cardiovascular: Normal rate, regular rhythm, no murmurs / rubs / gallops. No extremity edema. GI: abdomen is soft and compressible.  No distention.  No tenderness. Bowel sounds are present. Musculoskeletal:  No joint deformity in upper and lower extremities. Good ROM, no contractures. Normal muscle tone.  Skin: no rashes, warm and dry Neurologic: No focal deficits. Psychiatric: Normal judgment and insight. Alert and oriented x 3. Normal mood.     Labs on Admission: I have personally reviewed following labs and imaging studies  CBC:  Recent Labs Lab 08/04/16 2115 08/04/16 2125  WBC 7.1  --   NEUTROABS 4.7  --   HGB 12.2* 13.3  HCT 37.8* 39.0  MCV 95.7  --   PLT 256  --    Basic Metabolic Panel:  Recent Labs Lab 08/04/16 2115 08/04/16 2125  NA 137 141  K 4.3 4.4  CL 98* 101  CO2 26  --   GLUCOSE 163* 161*  BUN 20 25*  CREATININE 6.66* 6.50*  CALCIUM 8.8*  --    GFR: Estimated Creatinine Clearance: 13.8 mL/min (by C-G formula based on SCr of 6.5 mg/dL (H)). Liver Function Tests:  Recent Labs Lab 08/04/16 2115  AST 19  ALT 18  ALKPHOS 110  BILITOT 0.7  PROT 7.6  ALBUMIN 3.4*   Sepsis Labs:  Lactic acid level 2.27  Radiological Exams on Admission: Dg Chest 2 View  Result Date: 08/04/2016 CLINICAL DATA:  Acute onset of substernal chest pain and hypotension. Initial encounter. EXAM: CHEST  2 VIEW COMPARISON:  Chest radiograph from 06/18/2016 FINDINGS: The lungs are well-aerated. Vascular congestion is noted. Increased interstitial markings may reflect mild  interstitial edema. There is no evidence of pleural effusion or pneumothorax. The heart is normal in size; the mediastinal contour is within normal limits. No acute osseous abnormalities are seen. IMPRESSION: Vascular congestion noted. Increased interstitial markings may reflect mild interstitial edema. Electronically Signed   By: Garald Balding M.D.   On: 08/04/2016 21:47    EKG: Independently reviewed. Dynamic changes as noted above.  Assessment/Plan Principal Problem:   Chest pain Active Problems:   Hypotension   ST segment depression      Chest pain in the setting of hypotension with dynamic EKG changes (ST depression in inferior leads) identified in the ED.  Known history of CAD, CHF. --Place in observation with telemetry monitoring --Repeat troponin --Cardiology consultation --Midodrine prn for hypotension --Avoid maintenance fluids due to history of CHF --Continue baby aspirin daily for now  ESRD on HD --Dialyzed today.  Would not be due again until Tuesday. --Continue Fosrenol, [employer] (patient wants to take his own medications)  DM --Hold oral medications.  SSI coverage with meals.  Of note, several home medications have been held for now because the patient does not want to incur additional charges while in an observation status.  Should he need inpatient services from cardiology, home medications will need to reviewed again.  DVT prophylaxis: Low risk, outpatient status Code Status: FULL Family Communication: Wife present in the ED at time of admission Disposition Plan: To be determined based on cardiology recommendations. Consults called: Cardiology Admission status: Place in observation with telemetry monitoring   TIME SPENT: 70 minutes   Eber Jones MD [facility] Pager [phone]  If 7PM-7AM, please contact night-coverage [email or web address] Password [email or web address]  08/05/2016, 1:21 AM

## 2016-08-05 NOTE — Progress Notes (Signed)
Patient admitted from ED. Patient alert, oriented, verbal. Vital signs stable. Patient denies any pain or discomfort. Will continue to monitor.

## 2016-08-05 NOTE — Progress Notes (Signed)
     Admission MDs note reviewed. 65 year old male with end-stage renal disease, coronary artery disease, RCA PCI in 2012 with normal ejection fraction, 50% on nuclear stress test 05/25/16 that was overall low risk with mild apical defect, recent hypotensive episodes during hemodialysis with recent chest pain episode, lateral ST segment depression and elevated troponin of 0.3 consistent with non-ST elevation myocardial infarction.   - Agree with left heart catheterization Monday.  - Heparin drip IV.  Please see history and physical for full details.  Candee Furbish, MD

## 2016-08-05 NOTE — Progress Notes (Signed)
  Patient well known to me from the HF Clinic.  Admitted with CP and mildly positive troponin. ECG with mildly dynamic ST-T changes.   Last cath 2008 with PCI RCA. Recent Myoview negative.  Given recurrent CP, + troponin, dynamic ECG changes will plan coronary angio tomorrow.  I discussed the risks with [name] as well as possibility of finding high-grade CAD. He is willing to proceed.  Given lack of CP currently and flat troponins will hold off on heparin now as he is worried about bleeding.   D/W Drs. Mikhail and [place]. We will put him on HF Service.   [name] [place],MD 6:36 PM

## 2016-08-06 ENCOUNTER — Encounter (HOSPITAL_COMMUNITY): Payer: Self-pay | Admitting: Internal Medicine

## 2016-08-06 ENCOUNTER — Encounter (HOSPITAL_COMMUNITY)
Admission: EM | Disposition: A | Discharge status: Home / Self Care | Payer: Self-pay | Source: Home / Self Care | Attending: Internal Medicine

## 2016-08-06 DIAGNOSIS — I200 Unstable angina: Secondary | ICD-10-CM

## 2016-08-06 DIAGNOSIS — I2510 Atherosclerotic heart disease of native coronary artery without angina pectoris: Secondary | ICD-10-CM

## 2016-08-06 DIAGNOSIS — R9431 Abnormal electrocardiogram [ECG] [EKG]: Secondary | ICD-10-CM

## 2016-08-06 DIAGNOSIS — I953 Hypotension of hemodialysis: Secondary | ICD-10-CM

## 2016-08-06 DIAGNOSIS — I214 Non-ST elevation (NSTEMI) myocardial infarction: Principal | ICD-10-CM

## 2016-08-06 DIAGNOSIS — I959 Hypotension, unspecified: Secondary | ICD-10-CM

## 2016-08-06 HISTORY — PX: CARDIAC CATHETERIZATION: SHX172

## 2016-08-06 LAB — BASIC METABOLIC PANEL
ANION GAP: 14 (ref 5–15)
ANION GAP: 14 (ref 5–15)
BUN: 45 mg/dL — ABNORMAL HIGH (ref 6–20)
BUN: 56 mg/dL — ABNORMAL HIGH (ref 6–20)
CALCIUM: 8.8 mg/dL — AB (ref 8.9–10.3)
CHLORIDE: 101 mmol/L (ref 101–111)
CO2: 26 mmol/L (ref 22–32)
CO2: 25 mmol/L (ref 22–32)
CREATININE: 11.22 mg/dL — AB (ref 0.61–1.24)
Calcium: 8.5 mg/dL — ABNORMAL LOW (ref 8.9–10.3)
Chloride: 100 mmol/L — ABNORMAL LOW (ref 101–111)
Creatinine, Ser: 10.13 mg/dL — ABNORMAL HIGH (ref 0.61–1.24)
GFR calc non Af Amer: 4 mL/min — ABNORMAL LOW (ref >60)
GFR, EST AFRICAN AMERICAN: 5 mL/min — AB (ref >60)
GFR, EST AFRICAN AMERICAN: 5 mL/min — AB (ref >60)
GFR, EST NON AFRICAN AMERICAN: 5 mL/min — AB (ref >60)
GLUCOSE: 144 mg/dL — AB (ref 65–99)
Glucose, Bld: 119 mg/dL — ABNORMAL HIGH (ref 65–99)
POTASSIUM: 5.1 mmol/L (ref 3.5–5.1)
POTASSIUM: 5 mmol/L (ref 3.5–5.1)
SODIUM: 140 mmol/L (ref 135–145)
Sodium: 140 mmol/L (ref 135–145)

## 2016-08-06 LAB — CBC
HCT: 32.7 % — ABNORMAL LOW (ref 39.0–52.0)
HEMOGLOBIN: 10.5 g/dL — AB (ref 13.0–17.0)
MCH: 30.4 pg (ref 26.0–34.0)
MCHC: 32.1 g/dL (ref 30.0–36.0)
MCV: 94.8 fL (ref 78.0–100.0)
Platelets: 227 10*3/uL (ref 150–400)
RBC: 3.45 MIL/uL — AB (ref 4.22–5.81)
RDW: 16.2 % — ABNORMAL HIGH (ref 11.5–15.5)
WBC: 5.5 10*3/uL (ref 4.0–10.5)

## 2016-08-06 LAB — TROPONIN I: TROPONIN I: 0.19 ng/mL — AB (ref <0.03)

## 2016-08-06 LAB — PROTIME-INR
INR: 0.96
Prothrombin Time: 12.7 seconds (ref 11.4–15.2)

## 2016-08-06 LAB — GLUCOSE, CAPILLARY
GLUCOSE-CAPILLARY: 132 mg/dL — AB (ref 65–99)
GLUCOSE-CAPILLARY: 155 mg/dL — AB (ref 65–99)
Glucose-Capillary: 210 mg/dL — ABNORMAL HIGH (ref 65–99)

## 2016-08-06 SURGERY — LEFT HEART CATH AND CORONARY ANGIOGRAPHY
Anesthesia: LOCAL

## 2016-08-06 MED ORDER — CLOPIDOGREL BISULFATE 300 MG PO TABS
ORAL_TABLET | ORAL | Status: DC | PRN
  Administered 2016-08-06: 300 mg via ORAL

## 2016-08-06 MED ORDER — ACETAMINOPHEN 325 MG PO TABS
650.0000 mg | ORAL_TABLET | ORAL | Status: DC | PRN
  Filled 2016-08-06: qty 2

## 2016-08-06 MED ORDER — SODIUM CHLORIDE 0.9% FLUSH
3.0000 mL | Freq: Two times a day (BID) | INTRAVENOUS | Status: DC
  Administered 2016-08-06: 3 mL via INTRAVENOUS

## 2016-08-06 MED ORDER — FENTANYL CITRATE (PF) 100 MCG/2ML IJ SOLN
INTRAMUSCULAR | Status: DC | PRN
  Administered 2016-08-06 (×2): 25 ug via INTRAVENOUS

## 2016-08-06 MED ORDER — ASPIRIN 81 MG PO CHEW
81.0000 mg | CHEWABLE_TABLET | ORAL | Status: AC
  Administered 2016-08-07: 81 mg via ORAL
  Filled 2016-08-06: qty 1

## 2016-08-06 MED ORDER — SODIUM CHLORIDE 0.9% FLUSH: 3.0000 mL | Freq: Two times a day (BID) | INTRAVENOUS | Status: DC

## 2016-08-06 MED ORDER — MIDAZOLAM HCL 2 MG/2ML IJ SOLN
INTRAMUSCULAR | Status: DC | PRN
  Administered 2016-08-06 (×2): 1 mg via INTRAVENOUS

## 2016-08-06 MED ORDER — ONDANSETRON HCL 4 MG/2ML IJ SOLN
4.0000 mg | Freq: Four times a day (QID) | INTRAMUSCULAR | Status: DC | PRN
  Filled 2016-08-06: qty 2

## 2016-08-06 MED ORDER — LIDOCAINE HCL (PF) 1 % IJ SOLN
INTRAMUSCULAR | Status: DC | PRN
  Administered 2016-08-06: 15 mL

## 2016-08-06 MED ORDER — SODIUM CHLORIDE 0.9% FLUSH: 3.0000 mL | INTRAVENOUS | Status: DC | PRN

## 2016-08-06 MED ORDER — HYDRALAZINE HCL 20 MG/ML IJ SOLN
INTRAMUSCULAR | Status: DC | PRN
  Administered 2016-08-06: 10 mg via INTRAVENOUS

## 2016-08-06 MED ORDER — HEPARIN (PORCINE) IN NACL 2-0.9 UNIT/ML-% IJ SOLN
INTRAMUSCULAR | Status: DC | PRN
  Administered 2016-08-06: 1000 mL

## 2016-08-06 MED ORDER — HEPARIN (PORCINE) IN NACL 2-0.9 UNIT/ML-% IJ SOLN
INTRAMUSCULAR | Status: AC
  Filled 2016-08-06: qty 1000

## 2016-08-06 MED ORDER — SODIUM CHLORIDE 0.9 % IV SOLN: 250.0000 mL | INTRAVENOUS | Status: DC | PRN

## 2016-08-06 MED ORDER — CLOPIDOGREL BISULFATE 75 MG PO TABS
75.0000 mg | ORAL_TABLET | Freq: Every day | ORAL | Status: DC
  Administered 2016-08-07 – 2016-08-08 (×2): 75 mg via ORAL
  Filled 2016-08-06 (×2): qty 1

## 2016-08-06 MED ORDER — SODIUM CHLORIDE 0.9 % IV SOLN
INTRAVENOUS | Status: DC
  Administered 2016-08-07: 07:00:00 via INTRAVENOUS

## 2016-08-06 MED ORDER — HYDRALAZINE HCL 20 MG/ML IJ SOLN
INTRAMUSCULAR | Status: AC
  Filled 2016-08-06: qty 1

## 2016-08-06 MED ORDER — IOPAMIDOL (ISOVUE-370) INJECTION 76%
INTRAVENOUS | Status: DC | PRN
  Administered 2016-08-06: 90 mL via INTRA_ARTERIAL

## 2016-08-06 MED ORDER — LIDOCAINE HCL (PF) 1 % IJ SOLN
INTRAMUSCULAR | Status: AC
  Filled 2016-08-06: qty 30

## 2016-08-06 MED ORDER — CLOPIDOGREL BISULFATE 300 MG PO TABS
ORAL_TABLET | ORAL | Status: AC
  Filled 2016-08-06: qty 1

## 2016-08-06 MED ORDER — MIDAZOLAM HCL 2 MG/2ML IJ SOLN
INTRAMUSCULAR | Status: AC
  Filled 2016-08-06: qty 2

## 2016-08-06 MED ORDER — HYDROCODONE-ACETAMINOPHEN 5-325 MG PO TABS
1.0000 | ORAL_TABLET | ORAL | Status: DC | PRN
  Administered 2016-08-06: 1 via ORAL
  Administered 2016-08-07 (×2): 2 via ORAL
  Filled 2016-08-06 (×3): qty 2

## 2016-08-06 MED ORDER — IOPAMIDOL (ISOVUE-370) INJECTION 76%
INTRAVENOUS | Status: AC
  Filled 2016-08-06: qty 100

## 2016-08-06 MED ORDER — FENTANYL CITRATE (PF) 100 MCG/2ML IJ SOLN
INTRAMUSCULAR | Status: AC
  Filled 2016-08-06: qty 2

## 2016-08-06 SURGICAL SUPPLY — 11 items
CATH INFINITI 5 FR AR2 MOD (CATHETERS) ×2 IMPLANT
CATH INFINITI 5 FR LCB (CATHETERS) ×2 IMPLANT
CATH INFINITI MULTIPACK ST 5F (CATHETERS) ×2 IMPLANT
CATH LAUNCHER 5F JL4 (CATHETERS) ×1 IMPLANT
CATH SITESEER 5F NTR (CATHETERS) ×2 IMPLANT
CATHETER LAUNCHER 5F JL4 (CATHETERS) ×2
GUIDEWIRE 3MM J TIP .035 145 (WIRE) ×2 IMPLANT
KIT HEART LEFT (KITS) ×2 IMPLANT
PACK CARDIAC CATHETERIZATION (CUSTOM PROCEDURE TRAY) ×2 IMPLANT
SHEATH PINNACLE 5F 10CM (SHEATH) ×2 IMPLANT
TRANSDUCER W/STOPCOCK (MISCELLANEOUS) ×2 IMPLANT

## 2016-08-06 NOTE — Progress Notes (Signed)
Site area: Right groin a 5 french arterial sheath was removed  Site Prior to Removal:  Level 0  Pressure Applied For 20 MINUTES    Bedrest Beginning at 0930am  Manual:   Yes.    Patient Status During Pull:  stable  Post Pull Groin Site:  Level 0  Post Pull Instructions Given:  Yes.    Post Pull Pulses Present:  Yes.    Dressing Applied:  Yes.    Comments:  VS remain stable during sheath pull.

## 2016-08-06 NOTE — Interval H&P Note (Signed)
History and Physical Interval Note:  08/06/2016 7:45 AM  [name]  has presented today for surgery, with the diagnosis of unstable angina  The various methods of treatment have been discussed with the patient and family. After consideration of risks, benefits and other options for treatment, the patient has consented to  Procedure(s): Left Heart Cath and Coronary Angiography (N/A) and possible angioplasty as a surgical intervention .  The patient's history has been reviewed, patient examined, no change in status, stable for surgery.  I have reviewed the patient's chart and labs.  Questions were answered to the patient's satisfaction.    Cath Lab Visit (complete for each Cath Lab visit)  Clinical Evaluation Leading to the Procedure:   ACS: Yes.    Non-ACS:    Anginal Classification: CCS IV  Anti-ischemic medical therapy: Minimal Therapy (1 class of medications)  Non-Invasive Test Results: No non-invasive testing performed  Prior CABG: No previous CABG       Bensimhon, Quillian Quince

## 2016-08-06 NOTE — Consult Note (Signed)
[facility] Renal Consultation Note  Indication for Consultation:  Management of ESRD/hemodialysis; anemia, hypertension/volume and secondary hyperparathyroidism  HPI: [name] is a 65 y.o. male with ESRD sec  DM / HD start 08/2008 ( op HD= [facility]  MWF Compliant with tx and meds)IDDM Type 2  ,CAD / RCA PTA  stent 2008 ,[place] Card / Ischemic  CM Dr. [name] follows /mild TR, Dyslipidemia (Statin intolerance/ uses Welchol) ,OSA  uses CPA,HO  Depression    GERD/ HO Colon Polyps/ Ho Gi Bld 05/2011 Divertic. on endo/ capule endoscopy . After last HD on Saturday  reported Chest pain and hypotension at Home SBP= 70  per wife, called EMS.  IN ER  -Troponin 0.21, 0.32, 0.19-Troponin 0.21, 0.32, 0.19, EKG showed lateral ST segment depression ,and taken to Cath lab this am and noted to have ="heavily calcified coronary arteries, high grade lesion in [place] LAD 90%, midRCA 70-80%, LVEDP 17. Plan for possible PCI of LAD +/-RCA".       He currently denies chest pain or sob  bp in room 142/70.  Noted On Midodrine 10 mg only on  hd days .  He has felt overall generalized weakness with no falls. He denies fevers, chills , nausea ,vomiting, feet  swelling, or  Dizziness.. He has significant  DJD  With R TK Arthroplasty  Dr. Percell Miller 08-2015, and has also significant   L Knee DJD  Needing replacement .        Past Medical History:  Diagnosis Date  . Allergic rhinitis, cause unspecified 02/24/2014  . Anemia 06/16/2011  . BENIGN PROSTATIC HYPERTROPHY 10/14/2009  . CAD, NATIVE VESSEL 02/06/2009   saw Dr. Missy Sabins last jan 2013  . Cervical radiculopathy, chronic 02/23/2016   Right c5-6 by NCS/EMG  . CHEST PAIN 03/29/2010  . COLONIC POLYPS, HX OF 10/14/2009  . CONGESTIVE HEART FAILURE 06/18/2007  . Dementia [id number]  . DEPRESSION 10/14/2009  . Depression 09/24/2015  . DIABETES MELLITUS, TYPE II 02/01/2010  . DIASTOLIC HEART FAILURE, CHRONIC 02/06/2009  . DIZZINESS 07/17/2010  . DYSLIPIDEMIA  06/18/2007  . DYSPNEA 10/29/2008  . ESRD (end stage renal disease) on dialysis ([place]) 08/04/2010   "TTS;  " (04/18/2015)  . FOOT PAIN 08/12/2008  . GAIT DISTURBANCE 03/03/2010  . GASTROENTERITIS, VIRAL 10/14/2009  . GERD 06/18/2007  . GOITER, MULTINODULAR 12/26/2007  . GOUT 06/18/2007  . GYNECOMASTIA 07/17/2010  . Hemodialysis access, fistula mature [facility])    Dialysis T-Th-Sa ([place]) Right upper arm fistula  . Hyperlipidemia 10/16/2011  . Hyperparathyroidism, secondary ([place]) 06/16/2011  . HYPERTENSION 06/18/2007  . Hyperthyroidism   . HYPERTHYROIDISM 02/02/2010  . Hypocalcemia 06/07/2010  . Ischemic cardiomyopathy 06/16/2011  . NECK PAIN 07/31/2010  . ONYCHOMYCOSIS, TOENAILS 12/26/2007  . OSA on CPAP 10/16/2011  . Other malaise and fatigue 11/24/2009  . PERIPHERAL NEUROPATHY 06/18/2007  . Prostate cancer ([place])   . PULMONARY NODULE, RIGHT LOWER LOBE 06/08/2009  . Renal insufficiency   . Sleep apnea    cpap machine and o2  . TRANSAMINASES, SERUM, ELEVATED 02/01/2010  . Transfusion history    none recent  . Unspecified hypotension 01/30/2010    Past Surgical History:  Procedure Laterality Date  . ARTERIOVENOUS GRAFT PLACEMENT Right 2009   forearm/notes 02/01/2011  . AV FISTULA PLACEMENT  11/07/2011   Procedure: INSERTION OF ARTERIOVENOUS (AV) GORE-TEX GRAFT ARM;  Surgeon: Tinnie Gens, MD;  Location: [place];  Service: Vascular;  Laterality: Left;  . BACK SURGERY  1998  .  BASCILIC VEIN TRANSPOSITION Right 02/27/2013   Procedure: BASCILIC VEIN TRANSPOSITION;  Surgeon: Mal Misty, MD;  Location: [place];  Service: Vascular;  Laterality: Right;  Right Basilic Vein Transposition   . CARDIAC CATHETERIZATION N/A 08/06/2016   Procedure: Left Heart Cath and Coronary Angiography;  Surgeon: Jolaine Artist, MD;  Location: [place] CV LAB;  Service: Cardiovascular;  Laterality: N/A;  . CERVICAL SPINE SURGERY  2/09   "to repair nerve problems in my left arm"  . CHOLECYSTECTOMY    .  CORONARY ANGIOPLASTY WITH STENT PLACEMENT  06/11/2008  . CORONARY ANGIOPLASTY WITH STENT PLACEMENT  06/2007   TAXUS stent to RCA/notes 01/31/2011  . ESOPHAGOGASTRODUODENOSCOPY  09/28/2011   Procedure: ESOPHAGOGASTRODUODENOSCOPY (EGD);  Surgeon: Missy Sabins, MD;  Location: [facility] ENDOSCOPY;  Service: Endoscopy;  Laterality: N/A;  . ESOPHAGOGASTRODUODENOSCOPY N/A 04/07/2015   Procedure: ESOPHAGOGASTRODUODENOSCOPY (EGD);  Surgeon: Teena Irani, MD;  Location: [facility] ENDOSCOPY;  Service: Endoscopy;  Laterality: N/A;  . ESOPHAGOGASTRODUODENOSCOPY N/A 04/19/2015   Procedure: ESOPHAGOGASTRODUODENOSCOPY (EGD);  Surgeon: Arta Silence, MD;  Location[facility] ENDOSCOPY;  Service: Endoscopy;  Laterality: N/A;  . FOREIGN BODY REMOVAL  09/2003   via upper endoscopy/notes 02/12/2011  . GIVENS CAPSULE STUDY  09/30/2011   Procedure: GIVENS CAPSULE STUDY;  Surgeon: Jeryl Columbia, MD;  Location: [facility] ENDOSCOPY;  Service: Endoscopy;  Laterality: N/A;  . INSERTION OF DIALYSIS CATHETER Right 2014  . INSERTION OF DIALYSIS CATHETER Left 02/11/2013   Procedure: INSERTION OF DIALYSIS CATHETER;  Surgeon: Conrad De Leon, MD;  Location: [place];  Service: Vascular;  Laterality: Left;  Ultrasound guided  . REMOVAL OF A DIALYSIS CATHETER Right 02/11/2013   Procedure: REMOVAL OF A DIALYSIS CATHETER;  Surgeon: Conrad Brooks, MD;  Location: [place];  Service: Vascular;  Laterality: Right;  . SAVORY DILATION N/A 04/07/2015   Procedure: SAVORY DILATION;  Surgeon: Teena Irani, MD;  Location: [facility] ENDOSCOPY;  Service: Endoscopy;  Laterality: N/A;  . SHUNTOGRAM N/A 09/20/2011   Procedure: [place] Mal[place];  Surgeon: Conrad Edgar, MD;  Location: [facility] CATH LAB;  Service: Cardiovascular;  Laterality: N/A;  . TONSILLECTOMY    . TOTAL KNEE ARTHROPLASTY Right 08/02/2015   Procedure: TOTAL KNEE ARTHROPLASTY;  Surgeon: Renette Butters, MD;  Location: [place];  Service: Orthopedics;  Laterality: Right;  . VENOGRAM N/A 01/26/2013   Procedure: VENOGRAM;  Surgeon: Angelia Mould,  MD;  Location: [facility] CATH LAB;  Service: Cardiovascular;  Laterality: N/A;      Family History  Problem Relation Age of Onset  . Heart disease Sister   . Heart disease Father   . Diabetes Father   . Kidney failure Father   . Hypertension Father   . Cancer Neg Hx       reports that he quit smoking about 10 years ago. His smoking use included Cigarettes. He has a 25.00 pack-year smoking history. He quit smokeless tobacco use about 10 years ago. He reports that he does not drink alcohol or use drugs.   Allergies  Allergen Reactions  . Cephalexin Swelling and Other (See Comments)    Tongue swelling  . Statins Other (See Comments)    Weak muscles  . Ciprofloxacin Rash    Prior to Admission medications   Medication Sig Start Date End Date Taking? Authorizing Provider  allopurinol (ZYLOPRIM) 100 MG tablet Take 1 tablet (100 mg total) by mouth daily. 07/20/16  Yes Biagio Borg, MD  aspirin 81 MG tablet Take 81 mg by mouth daily.  Yes Historical Provider, MD  cinacalcet (SENSIPAR) 60 MG tablet Take 60 mg by mouth 2 (two) times daily.    Yes Historical Provider, MD  colesevelam (WELCHOL) 625 MG tablet TAKE THREE TABLETS BY MOUTH TWICE DAILY WITH MEALS Patient taking differently: Take 1,250 mg by mouth 2 (two) times daily with a meal. TAKE THREE TABLETS BY MOUTH TWICE DAILY WITH MEALS 07/20/16  Yes Biagio Borg, MD  ezetimibe (ZETIA) 10 MG tablet Take 1 tablet (10 mg total) by mouth daily. 03/15/16  Yes Jolaine Artist, MD  gabapentin (NEURONTIN) 300 MG capsule 1-2 tabs by mouth at bedtime for sleep and pain Patient taking differently: Take 600 mg by mouth at bedtime. 1-2 tabs by mouth at bedtime for sleep and pain 07/20/16  Yes Biagio Borg, MD  lanthanum (FOSRENOL) 1000 MG chewable tablet Chew 1,000 mg by mouth 2 (two) times daily with a meal. Reported on 12/23/2015   Yes Historical Provider, MD  midodrine (PROAMATINE) 2.5 MG tablet Take 1 tablet (2.5 mg total) by mouth 2 (two) times daily  with a meal. ONLY ON HD DAY OR FOR BP LESS THAN 90 04/25/16  Yes Jolaine Artist, MD  multivitamin (RENA-VIT) TABS tablet Take 1 tablet by mouth daily. Reported on 10/24/2015   Yes Historical Provider, MD  repaglinide (PRANDIN) 0.5 MG tablet Take 1 tablet (0.5 mg total) by mouth daily with breakfast. 06/19/16  Yes Renato Shin, MD  acetaminophen (TYLENOL) 325 MG tablet Take 1 tablet (325 mg total) by mouth every 4 (four) hours as needed for headache or mild pain. Patient not taking: Reported on 08/04/2016 08/10/15   Silver Huguenin Elgergawy, MD  citalopram (CELEXA) 10 MG tablet Take 1 tablet (10 mg total) by mouth daily. 09/21/15   Biagio Borg, MD    NFA:OZHYQM chloride, sodium chloride, sodium chloride, acetaminophen, nitroGLYCERIN, ondansetron (ZOFRAN) IV, sodium chloride flush, sodium chloride flush, sodium chloride flush  Results for orders placed or performed during the hospital encounter of 08/04/16 (from the past 48 hour(s))  CBC with Differential     Status: Abnormal   Collection Time: 08/04/16  9:15 PM  Result Value Ref Range   WBC 7.1 4.0 - 10.5 K/uL   RBC 3.95 (L) 4.22 - 5.81 MIL/uL   Hemoglobin 12.2 (L) 13.0 - 17.0 g/dL   HCT 37.8 (L) 39.0 - 52.0 %   MCV 95.7 78.0 - 100.0 fL   MCH 30.9 26.0 - 34.0 pg   MCHC 32.3 30.0 - 36.0 g/dL   RDW 16.1 (H) 11.5 - 15.5 %   Platelets 256 150 - 400 K/uL   Neutrophils Relative % 66 %   Neutro Abs 4.7 1.7 - 7.7 K/uL   Lymphocytes Relative 20 %   Lymphs Abs 1.4 0.7 - 4.0 K/uL   Monocytes Relative 12 %   Monocytes Absolute 0.9 0.1 - 1.0 K/uL   Eosinophils Relative 2 %   Eosinophils Absolute 0.1 0.0 - 0.7 K/uL   Basophils Relative 0 %   Basophils Absolute 0.0 0.0 - 0.1 K/uL  Comprehensive metabolic panel     Status: Abnormal   Collection Time: 08/04/16  9:15 PM  Result Value Ref Range   Sodium 137 135 - 145 mmol/L   Potassium 4.3 3.5 - 5.1 mmol/L   Chloride 98 (L) 101 - 111 mmol/L   CO2 26 22 - 32 mmol/L   Glucose, Bld 163 (H) 65 - 99 mg/dL    BUN 20 6 - 20 mg/dL  Creatinine, Ser 6.66 (H) 0.61 - 1.24 mg/dL   Calcium 8.8 (L) 8.9 - 10.3 mg/dL   Total Protein 7.6 6.5 - 8.1 g/dL   Albumin 3.4 (L) 3.5 - 5.0 g/dL   AST 19 15 - 41 U/L   ALT 18 17 - 63 U/L   Alkaline Phosphatase 110 38 - 126 U/L   Total Bilirubin 0.7 0.3 - 1.2 mg/dL   GFR calc non Af Amer 8 (L) >60 mL/min   GFR calc Af Amer 9 (L) >60 mL/min    Comment: (NOTE) The eGFR has been calculated using the CKD EPI equation. This calculation has not been validated in all clinical situations. eGFR's persistently <60 mL/min signify possible Chronic Kidney Disease.    Anion gap 13 5 - 15  I-Stat Troponin, ED (not at [facility])     Status: None   Collection Time: 08/04/16  9:24 PM  Result Value Ref Range   Troponin i, poc 0.07 0.00 - 0.08 ng/mL   Comment 3            Comment: Due to the release kinetics of cTnI, a negative result within the first hours of the onset of symptoms does not rule out myocardial infarction with certainty. If myocardial infarction is still suspected, repeat the test at appropriate intervals.   I-Stat CG4 Lactic Acid, ED     Status: Abnormal   Collection Time: 08/04/16  9:25 PM  Result Value Ref Range   Lactic Acid, Venous 2.27 (HH) 0.5 - 1.9 mmol/L   Comment NOTIFIED PHYSICIAN   I-Stat Chem 8, ED     Status: Abnormal   Collection Time: 08/04/16  9:25 PM  Result Value Ref Range   Sodium 141 135 - 145 mmol/L   Potassium 4.4 3.5 - 5.1 mmol/L   Chloride 101 101 - 111 mmol/L   BUN 25 (H) 6 - 20 mg/dL   Creatinine, Ser 6.50 (H) 0.61 - 1.24 mg/dL   Glucose, Bld 161 (H) 65 - 99 mg/dL   Calcium, Ion 0.97 (L) 1.15 - 1.40 mmol/L   TCO2 30 0 - 100 mmol/L   Hemoglobin 13.3 13.0 - 17.0 g/dL   HCT 39.0 39.0 - 52.0 %  Glucose, capillary     Status: Abnormal   Collection Time: 08/05/16  1:36 AM  Result Value Ref Range   Glucose-Capillary 230 (H) 65 - 99 mg/dL  Troponin I     Status: Abnormal   Collection Time: 08/05/16  1:52 AM  Result Value Ref Range    Troponin I 0.21 (HH) <0.03 ng/mL    Comment: CRITICAL RESULT CALLED TO, READ BACK BY AND VERIFIED WITH: THOMAS,Q RN 08/05/2016 0428 JORDANS   Troponin I     Status: Abnormal   Collection Time: 08/05/16  7:17 AM  Result Value Ref Range   Troponin I 0.32 (HH) <0.03 ng/mL    Comment: CRITICAL VALUE NOTED.  VALUE IS CONSISTENT WITH PREVIOUSLY REPORTED AND CALLED VALUE.  Glucose, capillary     Status: Abnormal   Collection Time: 08/05/16  7:37 AM  Result Value Ref Range   Glucose-Capillary 116 (H) 65 - 99 mg/dL  Glucose, capillary     Status: Abnormal   Collection Time: 08/05/16 11:25 AM  Result Value Ref Range   Glucose-Capillary 192 (H) 65 - 99 mg/dL  Glucose, capillary     Status: Abnormal   Collection Time: 08/05/16  4:36 PM  Result Value Ref Range   Glucose-Capillary 145 (H) 65 - 99 mg/dL  Glucose, capillary     Status: Abnormal   Collection Time: 08/05/16  9:04 PM  Result Value Ref Range   Glucose-Capillary 220 (H) 65 - 99 mg/dL  CBC     Status: Abnormal   Collection Time: 08/06/16  4:39 AM  Result Value Ref Range   WBC 5.5 4.0 - 10.5 K/uL   RBC 3.45 (L) 4.22 - 5.81 MIL/uL   Hemoglobin 10.5 (L) 13.0 - 17.0 g/dL    Comment: REPEATED TO VERIFY SPECIMEN CHECKED FOR CLOTS DELTA CHECK NOTED    HCT 32.7 (L) 39.0 - 52.0 %   MCV 94.8 78.0 - 100.0 fL   MCH 30.4 26.0 - 34.0 pg   MCHC 32.1 30.0 - 36.0 g/dL   RDW 16.2 (H) 11.5 - 15.5 %   Platelets 227 150 - 400 K/uL  Troponin I     Status: Abnormal   Collection Time: 08/06/16  4:39 AM  Result Value Ref Range   Troponin I 0.19 (HH) <0.03 ng/mL    Comment: CRITICAL VALUE NOTED.  VALUE IS CONSISTENT WITH PREVIOUSLY REPORTED AND CALLED VALUE.  Basic metabolic panel     Status: Abnormal   Collection Time: 08/06/16  4:39 AM  Result Value Ref Range   Sodium 140 135 - 145 mmol/L   Potassium 5.1 3.5 - 5.1 mmol/L   Chloride 100 (L) 101 - 111 mmol/L   CO2 26 22 - 32 mmol/L   Glucose, Bld 144 (H) 65 - 99 mg/dL   BUN 45 (H) 6 - 20  mg/dL   Creatinine, Ser 10.13 (H) 0.61 - 1.24 mg/dL    Comment: DELTA CHECK NOTED   Calcium 8.8 (L) 8.9 - 10.3 mg/dL   GFR calc non Af Amer 5 (L) >60 mL/min   GFR calc Af Amer 5 (L) >60 mL/min    Comment: (NOTE) The eGFR has been calculated using the CKD EPI equation. This calculation has not been validated in all clinical situations. eGFR's persistently <60 mL/min signify possible Chronic Kidney Disease.    Anion gap 14 5 - 15  Protime-INR     Status: None   Collection Time: 08/06/16  4:59 AM  Result Value Ref Range   Prothrombin Time 12.7 11.4 - 15.2 seconds   INR 0.96   Glucose, capillary     Status: Abnormal   Collection Time: 08/06/16 11:35 AM  Result Value Ref Range   Glucose-Capillary 210 (H) 65 - 99 mg/dL   Comment 1 Document in Chart      ROS: see hpi  Physical Exam: Vitals:   08/06/16 0928 08/06/16 0933  BP: (!) 176/84 (!) 180/87  Pulse: 78 79  Resp: 14 17  Temp:  98.3 F (36.8 C)     General: alert AAM  , nad , pleasant NAD HEENT: [place] MMM, nonicteric   Neck: no jvd ,supple  Heart: RRR , 1/6 sem . No rub or gallop Lungs: CTA  nonlabored breathing  Abdomen: bs pos., Obese, soft , nt,nd  Extremities: no pedal edema ,  Skin: no overt rash, warm  dry Neuro: alert OX3 , no overt acute deficits, moves all extrem. Dialysis Access: RUA AVF  Dialysis Orders: Center: [facility]   on TTS . EDW 105.5 kg HD Bath 2k, 2ca  Time 4hrs Heparin no hep. . Access RUA AVF     uf profile 4  [name] 3 mcg IV/HD     Other op labs hgb 11.6  (08-02-16) . Ca 9.1  Phos 3.1 pth  410  Assessment/Plan 1. Chest pain / NSTEMI/ CAD- noted now sp Card cath with plans for possible PCI of LAD +/-RCA/ and intolerant to statin and beta-blockers 2. ESRD -   HD today off schedule sec to cath dye / back on schedule later in hospital stay.  3. Hypertension/volume  - keep sbp >100  With hd today  Currently 142/70 4. Anemia  -  hgb 10.5 ,no esa as op monitor hgb  5. Metabolic bone disease -  IV  hec on hd and Fosrenol binder  6. DM type 2 - per admit  7. HO OSA - cpap  Per admit   Ernest Haber, PA-C [facility] [phone] 08/06/2016, 12:41 PM   I have seen and examined this patient and agree with plan and assessment in the above note with renal recommendations/intervention highlighted. [name] underwent cardiac cath which revealed high-grade, heavily calcified mid LAD lesion as well as RCA lesion and had elevated LVEDP.  He also has volume overload and HTN so will plan for HD today and again tomorrow to keep him on his schedule of TTS.  Today is mainly for volume management and will re-evaluate edw. [name] John A Lynell Greenhouse,MD 08/06/2016 2:24 PM

## 2016-08-06 NOTE — H&P (View-Only) (Signed)
  Patient well known to me from the HF Clinic.  Admitted with CP and mildly positive troponin. ECG with mildly dynamic ST-T changes.   Last cath 2008 with PCI RCA. Recent Myoview negative.  Given recurrent CP, + troponin, dynamic ECG changes will plan coronary angio tomorrow.  I discussed the risks with Mr. Swaim as well as possibility of finding high-grade CAD. He is willing to proceed.  Given lack of CP currently and flat troponins will hold off on heparin now as he is worried about bleeding.   D/W Drs. Mikhail and [place]. We will put him on HF Service.   Judy Goodenow,MD 6:36 PM

## 2016-08-06 NOTE — Progress Notes (Signed)
PROGRESS NOTE    [name]  NLG:[id number] DOB: 1951/08/15 DOA: 08/04/2016 PCP: [name], MD   Chief Complaint  Patient presents with  . Chest Pain    Brief Narrative:  HPI on 08/05/2016 by [name] [name] is a 65 y.o. gentleman with a history of combined systolic and diastolic heart failure, ESRD on HD T/Th/Sat, HTN, DM, and CAD with DES to RCA who has had recurrent problems with hypotension, particularly on dialysis days.  He has had to come off all of his anti-hypertensives, and he now takes midodrine on HD days and prn for systolic blood pressures less than 100.  Today, he developed chest pain after routine HD.  He reports a history of recurrent intermittent chest pain, but he became worried today when symptoms lasted longer than usual (about one hour total) and were associated with low blood pressures (his wife documented systolic blood pressure of 70 at home; the patient did not take an additional dose of midodrine at that time).  His wife called EMS, and the patient was brought to the ED for further evaluation.  He took four baby aspirin prior to arrival.  The patient's chest pain has not been associated with any shortness of breath, light-headedness, diaphoresis, or near-syncope.  He has felt generalized weakness but he has not fallen.  No nausea or vomiting.  No swelling. Assessment & Plan   Chest pain/Elevated troponin-NSTEMI -Occurred with dialysis and after straining during a bowel movement. ?related to hypotension -Troponin 0.21, 0.32, 0.19 -EKG showed lateral ST segment depression -Cardiology consulted and appreciated- LHC today: heavily calcified coronary arteries, high grade lesion in [place] LAD 90%, midRCA 70-80%, LVEDP 17. Plan for possible PCI of LAD +/-RCA -intolerant to statin and beta-blockers  ESRD  -Hemodialysis on TTS -Patient cut short his HD treatment on 11/4 by 12 minutes.  States he is very compliant with HD. -Nephrology consulted and  appreciated  Diabetes mellitus, type II with neuropathy -Continue ISS and CBG monitoring -Restarted gabapentin -Repaglinide held  Depression -Continue celexa  Gout -Continue allopurinol  Chronic diastolic heart failure  -Echocardiogram in 01/12/2015 EF 08-14%, grade 1 diastolic dysfunction -Currently euvolemic -Continue control with HD  DVT Prophylaxis  SCDs  Code Status: Full  Family Communication: Wife at bedside  Disposition Plan:  Inpatient. Pending intervention 08/07/2016.   Consultants Cardiology  Procedures  Cardiac catheterization  Antibiotics   Anti-infectives    None      Subjective:   [name] seen and examined today.  Patient denies current chest pain. Denies shortness of breath, abdominal pain, N/V/D/C, dizziness, headache.   Objective:   Vitals:   08/06/16 0918 08/06/16 0923 08/06/16 0928 08/06/16 0933  BP: (!) 170/84 (!) 174/86 (!) 176/84 (!) 180/87  Pulse: 78 80 78 79  Resp: 16 11 14 17   Temp:    98.3 F (36.8 C)  TempSrc:    Oral  SpO2: 97% 99% 98% 100%  Weight:      Height:        Intake/Output Summary (Last 24 hours) at 08/06/16 1107 Last data filed at 08/06/16 1042  Gross per 24 hour  Intake              480 ml  Output                0 ml  Net              480 ml   Filed Weights   08/04/16  2027 08/05/16 0040 08/06/16 0400  Weight: 106.6 kg (235 lb) 105 kg (231 lb 8 oz) 106.7 kg (235 lb 4.8 oz)    Exam  General: Well developed, well nourished, NAD, appears stated age  13: NCAT,  mucous membranes moist.   Cardiovascular: S1 S2 auscultated, RRR, no murmrus  Respiratory: Clear to auscultation bilaterally with equal chest rise  Abdomen: Soft, nontender, nondistended, + bowel sounds  Extremities: warm dry without cyanosis clubbing or edema  Neuro: AAOx3, nonfocal  Psych: Normal affect and demeanor, pleasant    Data Reviewed: I have personally reviewed following labs and imaging studies  CBC:  Recent Labs Lab  08/04/16 2115 08/04/16 2125 08/06/16 0439  WBC 7.1  --  5.5  NEUTROABS 4.7  --   --   HGB 12.2* 13.3 10.5*  HCT 37.8* 39.0 32.7*  MCV 95.7  --  94.8  PLT 256  --  185   Basic Metabolic Panel:  Recent Labs Lab 08/04/16 2115 08/04/16 2125 08/06/16 0439  NA 137 141 140  K 4.3 4.4 5.1  CL 98* 101 100*  CO2 26  --  26  GLUCOSE 163* 161* 144*  BUN 20 25* 45*  CREATININE 6.66* 6.50* 10.13*  CALCIUM 8.8*  --  8.8*   GFR: Estimated Creatinine Clearance: 8.9 mL/min (by C-G formula based on SCr of 10.13 mg/dL (H)). Liver Function Tests:  Recent Labs Lab 08/04/16 2115  AST 19  ALT 18  ALKPHOS 110  BILITOT 0.7  PROT 7.6  ALBUMIN 3.4*   No results for input(s): LIPASE, AMYLASE in the last 168 hours. No results for input(s): AMMONIA in the last 168 hours. Coagulation Profile:  Recent Labs Lab 08/06/16 0459  INR 0.96   Cardiac Enzymes:  Recent Labs Lab 08/05/16 0152 08/05/16 0717 08/06/16 0439  TROPONINI 0.21* 0.32* 0.19*   BNP (last 3 results) No results for input(s): PROBNP in the last 8760 hours. HbA1C: No results for input(s): HGBA1C in the last 72 hours. CBG:  Recent Labs Lab 08/05/16 0136 08/05/16 0737 08/05/16 1125 08/05/16 1636 08/05/16 2104  GLUCAP 230* 116* 192* 145* 220*   Lipid Profile: No results for input(s): CHOL, HDL, LDLCALC, TRIG, CHOLHDL, LDLDIRECT in the last 72 hours. Thyroid Function Tests: No results for input(s): TSH, T4TOTAL, FREET4, T3FREE, THYROIDAB in the last 72 hours. Anemia Panel: No results for input(s): VITAMINB12, FOLATE, FERRITIN, TIBC, IRON, RETICCTPCT in the last 72 hours. Urine analysis:    Component Value Date/Time   COLORURINE LT. YELLOW 06/26/2013 1200   APPEARANCEUR CLEAR 06/26/2013 1200   LABSPEC 1.020 06/26/2013 1200   PHURINE 8.5 06/26/2013 1200   GLUCOSEU 100 06/26/2013 1200   HGBUR MODERATE 06/26/2013 1200   BILIRUBINUR NEGATIVE 06/26/2013 1200   KETONESUR NEGATIVE 06/26/2013 1200   PROTEINUR  >300 (A) 01/04/2010 1032   UROBILINOGEN 0.2 06/26/2013 1200   NITRITE NEGATIVE 06/26/2013 1200   LEUKOCYTESUR TRACE 06/26/2013 1200   Sepsis Labs: @LABRCNTIP (procalcitonin:4,lacticidven:4)  )No results found for this or any previous visit (from the past 240 hour(s)).    Radiology Studies: Dg Chest 2 View  Result Date: 08/04/2016 CLINICAL DATA:  Acute onset of substernal chest pain and hypotension. Initial encounter. EXAM: CHEST  2 VIEW COMPARISON:  Chest radiograph from 06/18/2016 FINDINGS: The lungs are well-aerated. Vascular congestion is noted. Increased interstitial markings may reflect mild interstitial edema. There is no evidence of pleural effusion or pneumothorax. The heart is normal in size; the mediastinal contour is within normal limits. No acute osseous abnormalities  are seen. IMPRESSION: Vascular congestion noted. Increased interstitial markings may reflect mild interstitial edema. Electronically Signed   By: Garald Balding M.D.   On: 08/04/2016 21:47     Scheduled Meds: . allopurinol  100 mg Oral Daily  . aspirin  81 mg Oral Daily  . [START ON 08/07/2016] aspirin  81 mg Oral Pre-Cath  . cinacalcet  60 mg Oral BID WC  . citalopram  10 mg Oral Daily  . [START ON 08/07/2016] clopidogrel  75 mg Oral Q breakfast  . colesevelam  1,250 mg Oral BID WC  . ezetimibe  10 mg Oral Daily  . gabapentin  300-600 mg Oral QHS  . insulin aspart  0-9 Units Subcutaneous TID WC  . lanthanum  1,000 mg Oral BID WC  . multivitamin  1 tablet Oral QHS  . sodium chloride flush  3 mL Intravenous Q12H  . sodium chloride flush  3 mL Intravenous Q12H  . sodium chloride flush  3 mL Intravenous Q12H   Continuous Infusions: . [START ON 08/07/2016] sodium chloride       LOS: 1 day   Time Spent in minutes   30 minutes  Darah Simkin D.O. on 08/06/2016 at 11:07 AM  Between 7am to 7pm - Pager - [phone]  After 7pm go to [email or web address] - password [email or web address]  And look for the night coverage person  covering for me after hours  [facility] Office  [phone]

## 2016-08-06 NOTE — Progress Notes (Signed)
Advanced Heart Failure Rounding Note   Subjective:     Denies CP. Breathing OK. Underwent LHC this am with the following results.   1. Heavily calcified coronary arteries 2. High grade lesion in midLAD (90%) and borderline lesion midRCA (70-80%) 3. LVEDP 17  Troponin trending down.    Objective:   Weight Range:  Vital Signs:   Temp:  [97.5 F (36.4 C)-99 F (37.2 C)] 99 F (37.2 C) (11/06 0400) Pulse Rate:  [78-91] 80 (11/06 0851) Resp:  [12-21] 13 (11/06 0851) BP: (140-179)/(64-109) 148/99 (11/06 0851) SpO2:  [0 %-100 %] 99 % (11/06 0851) Weight:  [106.7 kg (235 lb 4.8 oz)] 106.7 kg (235 lb 4.8 oz) (11/06 0400) Last BM Date: 08/04/16  Weight change: Filed Weights   08/04/16 2027 08/05/16 0040 08/06/16 0400  Weight: 106.6 kg (235 lb) 105 kg (231 lb 8 oz) 106.7 kg (235 lb 4.8 oz)    Intake/Output:   Intake/Output Summary (Last 24 hours) at 08/06/16 0859 Last data filed at 08/05/16 2030  Gross per 24 hour  Intake              240 ml  Output                0 ml  Net              240 ml     Physical Exam: General:  Well appearing. No resp difficulty HEENT: normal Neck: supple. JVP 8 . Carotids 2+ bilat; no bruits. No lymphadenopathy or thryomegaly appreciated. Cor: PMI nondisplaced. Regular rate & rhythm. No rubs, gallops or murmurs. Lungs: clear Abdomen: soft, nontender, nondistended. No hepatosplenomegaly. No bruits or masses. Good bowel sounds. Extremities: no cyanosis, clubbing, rash, edema Neuro: alert & orientedx3, cranial nerves grossly intact. moves all 4 extremities w/o difficulty. Affect pleasant  Telemetry: NSR  Labs: Basic Metabolic Panel:  Recent Labs Lab 08/04/16 2115 08/04/16 2125 08/06/16 0439  NA 137 141 140  K 4.3 4.4 5.1  CL 98* 101 100*  CO2 26  --  26  GLUCOSE 163* 161* 144*  BUN 20 25* 45*  CREATININE 6.66* 6.50* 10.13*  CALCIUM 8.8*  --  8.8*    Liver Function Tests:  Recent Labs Lab 08/04/16 2115  AST 19  ALT  18  ALKPHOS 110  BILITOT 0.7  PROT 7.6  ALBUMIN 3.4*   No results for input(s): LIPASE, AMYLASE in the last 168 hours. No results for input(s): AMMONIA in the last 168 hours.  CBC:  Recent Labs Lab 08/04/16 2115 08/04/16 2125 08/06/16 0439  WBC 7.1  --  5.5  NEUTROABS 4.7  --   --   HGB 12.2* 13.3 10.5*  HCT 37.8* 39.0 32.7*  MCV 95.7  --  94.8  PLT 256  --  227    Cardiac Enzymes:  Recent Labs Lab 08/05/16 0152 08/05/16 0717 08/06/16 0439  TROPONINI 0.21* 0.32* 0.19*    BNP: BNP (last 3 results)  Recent Labs  08/09/15 0045  BNP 54.9    ProBNP (last 3 results) No results for input(s): PROBNP in the last 8760 hours.    Other results:  Imaging: Dg Chest 2 View  Result Date: 08/04/2016 CLINICAL DATA:  Acute onset of substernal chest pain and hypotension. Initial encounter. EXAM: CHEST  2 VIEW COMPARISON:  Chest radiograph from 06/18/2016 FINDINGS: The lungs are well-aerated. Vascular congestion is noted. Increased interstitial markings may reflect mild interstitial edema. There is no evidence of pleural effusion  or pneumothorax. The heart is normal in size; the mediastinal contour is within normal limits. No acute osseous abnormalities are seen. IMPRESSION: Vascular congestion noted. Increased interstitial markings may reflect mild interstitial edema. Electronically Signed   By: Garald Balding M.D.   On: 08/04/2016 21:47      Medications:     Scheduled Medications: . [MAR Hold] allopurinol  100 mg Oral Daily  . [MAR Hold] aspirin  81 mg Oral Daily  . [MAR Hold] cinacalcet  60 mg Oral BID WC  . [MAR Hold] citalopram  10 mg Oral Daily  . [MAR Hold] colesevelam  1,250 mg Oral BID WC  . [MAR Hold] ezetimibe  10 mg Oral Daily  . [MAR Hold] gabapentin  300-600 mg Oral QHS  . [MAR Hold] insulin aspart  0-9 Units Subcutaneous TID WC  . [MAR Hold] lanthanum  1,000 mg Oral BID WC  . [MAR Hold] multivitamin  1 tablet Oral QHS     Infusions: . sodium  chloride 1 mL (08/06/16 0809)  . heparin       PRN Medications:  sodium chloride, [MAR Hold] acetaminophen, clopidogrel, fentaNYL, heparin, hydrALAZINE, iopamidol, lidocaine (PF), midazolam, [MAR Hold] nitroGLYCERIN, [MAR Hold] ondansetron (ZOFRAN) IV   Assessment:   1. NSTEMI 2. CAD s/p PCI RCA in 2008 3. ESRD 4. DM2 5. HTN 6. Chronic diastolic HF  Plan/Discussion:    Cath results reviewed with him and his wife. He has probable high-grade (90%) heavily calcified mid LAD lesion. Will need Rotoblator.   Will plan HD later today (I discussed with Renal) and bring him back for PCI tomorrow. Start Plavix 300 x 1 then 75 mg daily. Unable to tolerate statins or b-blocker    Length of Stay: 1   Craig Ionescu MD 08/06/2016, 8:59 AM  Advanced Heart Failure Team Pager [phone] (M-F; 7a - 4p)  Please contact [facility] for night-coverage after hours (4p -7a ) and weekends on [email or web address]

## 2016-08-07 ENCOUNTER — Encounter (HOSPITAL_COMMUNITY)
Admission: EM | Disposition: A | Discharge status: Home / Self Care | Payer: Self-pay | Source: Home / Self Care | Attending: Internal Medicine

## 2016-08-07 ENCOUNTER — Encounter (HOSPITAL_COMMUNITY): Payer: Self-pay | Admitting: Cardiology

## 2016-08-07 DIAGNOSIS — R079 Chest pain, unspecified: Secondary | ICD-10-CM

## 2016-08-07 HISTORY — PX: CARDIAC CATHETERIZATION: SHX172

## 2016-08-07 LAB — CBC
HEMATOCRIT: 31.2 % — AB (ref 39.0–52.0)
HEMOGLOBIN: 10 g/dL — AB (ref 13.0–17.0)
MCH: 30.7 pg (ref 26.0–34.0)
MCHC: 32.1 g/dL (ref 30.0–36.0)
MCV: 95.7 fL (ref 78.0–100.0)
Platelets: 208 10*3/uL (ref 150–400)
RBC: 3.26 MIL/uL — AB (ref 4.22–5.81)
RDW: 16.4 % — AB (ref 11.5–15.5)
WBC: 5.4 10*3/uL (ref 4.0–10.5)

## 2016-08-07 LAB — GLUCOSE, CAPILLARY
GLUCOSE-CAPILLARY: 111 mg/dL — AB (ref 65–99)
GLUCOSE-CAPILLARY: 107 mg/dL — AB (ref 65–99)
Glucose-Capillary: 97 mg/dL (ref 65–99)

## 2016-08-07 LAB — POCT ACTIVATED CLOTTING TIME
ACTIVATED CLOTTING TIME: 235 s
ACTIVATED CLOTTING TIME: 213 s
Activated Clotting Time: 555 seconds
Activated Clotting Time: 186 seconds
Activated Clotting Time: 158 seconds

## 2016-08-07 SURGERY — CORONARY ATHERECTOMY
Anesthesia: LOCAL

## 2016-08-07 MED ORDER — BIVALIRUDIN 250 MG IV SOLR
INTRAVENOUS | Status: AC
  Filled 2016-08-07: qty 250

## 2016-08-07 MED ORDER — NITROGLYCERIN 1 MG/10 ML FOR IR/CATH LAB
INTRA_ARTERIAL | Status: AC
  Filled 2016-08-07: qty 10

## 2016-08-07 MED ORDER — FENTANYL CITRATE (PF) 100 MCG/2ML IJ SOLN
INTRAMUSCULAR | Status: AC
  Filled 2016-08-07: qty 2

## 2016-08-07 MED ORDER — SODIUM CHLORIDE 0.9% FLUSH: 3.0000 mL | INTRAVENOUS | Status: DC | PRN

## 2016-08-07 MED ORDER — LIDOCAINE HCL (PF) 1 % IJ SOLN
INTRAMUSCULAR | Status: AC
  Filled 2016-08-07: qty 30

## 2016-08-07 MED ORDER — MIDAZOLAM HCL 2 MG/2ML IJ SOLN
INTRAMUSCULAR | Status: AC
  Filled 2016-08-07: qty 2

## 2016-08-07 MED ORDER — NITROGLYCERIN 1 MG/10 ML FOR IR/CATH LAB
INTRA_ARTERIAL | Status: DC | PRN
  Administered 2016-08-07 (×2): 200 ug via INTRACORONARY

## 2016-08-07 MED ORDER — HYDROMORPHONE HCL 1 MG/ML IJ SOLN
1.0000 mg | Freq: Once | INTRAMUSCULAR | Status: AC
  Administered 2016-08-07: 22:00:00 1 mg via INTRAVENOUS
  Filled 2016-08-07: qty 1

## 2016-08-07 MED ORDER — SODIUM CHLORIDE 0.9 % IV SOLN: 250.0000 mL | INTRAVENOUS | Status: DC | PRN

## 2016-08-07 MED ORDER — SODIUM CHLORIDE 0.9% FLUSH: 3.0000 mL | Freq: Two times a day (BID) | INTRAVENOUS | Status: DC

## 2016-08-07 MED ORDER — ATROPINE SULFATE 1 MG/10ML IJ SOSY
PREFILLED_SYRINGE | INTRAMUSCULAR | Status: AC
  Filled 2016-08-07: qty 10

## 2016-08-07 MED ORDER — HYDRALAZINE HCL 20 MG/ML IJ SOLN
10.0000 mg | Freq: Four times a day (QID) | INTRAMUSCULAR | Status: DC | PRN
  Administered 2016-08-07: 10 mg via INTRAVENOUS
  Filled 2016-08-07: qty 1

## 2016-08-07 MED ORDER — HEPARIN (PORCINE) IN NACL 2-0.9 UNIT/ML-% IJ SOLN
INTRAMUSCULAR | Status: DC | PRN
  Administered 2016-08-07: 1500 mL

## 2016-08-07 MED ORDER — IOPAMIDOL (ISOVUE-370) INJECTION 76%
INTRAVENOUS | Status: AC
  Filled 2016-08-07: qty 125

## 2016-08-07 MED ORDER — HEPARIN (PORCINE) IN NACL 2-0.9 UNIT/ML-% IJ SOLN
INTRAMUSCULAR | Status: AC
  Filled 2016-08-07: qty 1000

## 2016-08-07 MED ORDER — FENTANYL CITRATE (PF) 100 MCG/2ML IJ SOLN
INTRAMUSCULAR | Status: DC | PRN
  Administered 2016-08-07 (×2): 25 ug via INTRAVENOUS
  Administered 2016-08-07: 50 ug via INTRAVENOUS

## 2016-08-07 MED ORDER — SODIUM CHLORIDE 0.9% FLUSH
3.0000 mL | Freq: Two times a day (BID) | INTRAVENOUS | Status: DC
Start: 2016-08-07 — End: 2016-08-08
  Administered 2016-08-07: 22:00:00 3 mL via INTRAVENOUS

## 2016-08-07 MED ORDER — HYDRALAZINE HCL 20 MG/ML IJ SOLN
10.0000 mg | Freq: Once | INTRAMUSCULAR | Status: AC | PRN
  Administered 2016-08-07: 10 mg via INTRAVENOUS
  Filled 2016-08-07: qty 1

## 2016-08-07 MED ORDER — MIDAZOLAM HCL 2 MG/2ML IJ SOLN
INTRAMUSCULAR | Status: DC | PRN
  Administered 2016-08-07 (×2): 2 mg via INTRAVENOUS

## 2016-08-07 MED ORDER — SODIUM CHLORIDE 0.9 % IV SOLN
INTRAVENOUS | Status: DC | PRN
  Administered 2016-08-07 (×2): 1.75 mg/kg/h via INTRAVENOUS

## 2016-08-07 MED ORDER — LIDOCAINE HCL (PF) 1 % IJ SOLN
INTRAMUSCULAR | Status: DC | PRN
  Administered 2016-08-07: 17 mL via SUBCUTANEOUS

## 2016-08-07 MED ORDER — BIVALIRUDIN BOLUS VIA INFUSION - CUPID
INTRAVENOUS | Status: DC | PRN
  Administered 2016-08-07: 79.65 mg via INTRAVENOUS

## 2016-08-07 SURGICAL SUPPLY — 22 items
BALLN MOZEC 2.50X14 (BALLOONS) ×2
BALLN SPRINTER MX2 OTW 1.5X6 (BALLOONS) ×2
BALLN ~~LOC~~ EUPHORA RX 3.25X12 (BALLOONS) ×2
BALLN ~~LOC~~ MOZEC 3.5X10 (BALLOONS) ×2
BALLOON MOZEC 2.50X14 (BALLOONS) ×1 IMPLANT
BALLOON SPRINTER MX2 OTW 1.5X6 (BALLOONS) ×1 IMPLANT
BALLOON ~~LOC~~ EUPHORA RX 3.25X12 (BALLOONS) ×1 IMPLANT
BALLOON ~~LOC~~ MOZEC 3.5X10 (BALLOONS) ×1 IMPLANT
CATH VISTA GUIDE 6FR XBLAD4 (CATHETERS) ×2 IMPLANT
CROWN DIAMONDBACK CLASSIC 1.25 (BURR) ×2 IMPLANT
GUIDEWIRE 3MM J TIP .035 145 (WIRE) ×2 IMPLANT
KIT ENCORE 26 ADVANTAGE (KITS) ×4 IMPLANT
KIT HEART LEFT (KITS) ×2 IMPLANT
LUBRICANT VIPERSLIDE CORONARY (MISCELLANEOUS) ×2 IMPLANT
PACK CARDIAC CATHETERIZATION (CUSTOM PROCEDURE TRAY) ×2 IMPLANT
SHEATH PINNACLE 5F 10CM (SHEATH) ×2 IMPLANT
SHEATH PINNACLE 6F 10CM (SHEATH) ×2 IMPLANT
STENT SYNERGY DES 3X16 (Permanent Stent) ×2 IMPLANT
TRANSDUCER W/STOPCOCK (MISCELLANEOUS) ×2 IMPLANT
TUBING CIL FLEX 10 FLL-RA (TUBING) ×2 IMPLANT
WIRE ASAHI PROWATER 300CM (WIRE) ×2 IMPLANT
WIRE VIPER ADVANCE COR .012TIP (WIRE) ×2 IMPLANT

## 2016-08-07 NOTE — Care Management Note (Signed)
Case Management Note  Patient Details  Name: [name] MRN: [id number] Date of Birth: 1951-04-23  Subjective/Objective:   S/p  Coronary Graft Atherectomy  , will be on plavix NCM will cont to follow for dc needs.              Action/Plan:   Expected Discharge Date:                  Expected Discharge Plan:  Home/Self Care  In-House Referral:     Discharge planning Services  CM Consult  Post Acute Care Choice:    Choice offered to:     DME Arranged:    DME Agency:     HH Arranged:    HH Agency:     Status of Service:  In process, will continue to follow  If discussed at Long Length of Stay Meetings, dates discussed:    Additional Comments:  Zenon Mayo, RN 08/07/2016, 2:08 PM

## 2016-08-07 NOTE — Consult Note (Signed)
           [facility] CM Primary Care Navigator  08/07/2016  [name] 22-Apr-1951 [id number]   Patient seen at the bedside with wife [name]) to identify possible discharge needs.  Patient mentioned having chest pain and pressure which had led to this admission. Plan for discharge is home with wife to assist per patient.  Patient confirms that primary care provider is Dr. [name] with [facility] at [place]. Patient is independent with self care prior to admission.  Transportation to doctors' appointments is being provided by wife as stated..  Patient's wife shared using [facility] at [facility] to obtain medications without difficulty. Wife states that patient is doing his medication management at home with assistance from wife in picking up medications from pharmacy. Wife is the primary caregiver at home as stated.  Patient and wife voiced understanding to call primary care provider's office once discharged, for a post discharge follow-up appointment within a week or sooner if needs arise.  Patient letter provided as a reminder.  Patient and wife voiced no issues or concerns as well as denied any needs for disease management and care coordination at the moment. Dr. [name] (cardiologist) is managing his heart failure as reported by wife.   For questions, please contact:  Dannielle Huh, BSN, RN- [facility] Primary Care Navigator  Telephone: [phone] [place]

## 2016-08-07 NOTE — H&P (View-Only) (Signed)
Advanced Heart Failure Rounding Note   Subjective:     Denies CP. Breathing OK. Underwent LHC this am with the following results.   1. Heavily calcified coronary arteries 2. High grade lesion in midLAD (90%) and borderline lesion midRCA (70-80%) 3. LVEDP 17  Troponin trending down.    Objective:   Weight Range:  Vital Signs:   Temp:  [97.5 F (36.4 C)-99 F (37.2 C)] 99 F (37.2 C) (11/06 0400) Pulse Rate:  [78-91] 80 (11/06 0851) Resp:  [12-21] 13 (11/06 0851) BP: (140-179)/(64-109) 148/99 (11/06 0851) SpO2:  [0 %-100 %] 99 % (11/06 0851) Weight:  [106.7 kg (235 lb 4.8 oz)] 106.7 kg (235 lb 4.8 oz) (11/06 0400) Last BM Date: 08/04/16  Weight change: Filed Weights   08/04/16 2027 08/05/16 0040 08/06/16 0400  Weight: 106.6 kg (235 lb) 105 kg (231 lb 8 oz) 106.7 kg (235 lb 4.8 oz)    Intake/Output:   Intake/Output Summary (Last 24 hours) at 08/06/16 0859 Last data filed at 08/05/16 2030  Gross per 24 hour  Intake              240 ml  Output                0 ml  Net              240 ml     Physical Exam: General:  Well appearing. No resp difficulty HEENT: normal Neck: supple. JVP 8 . Carotids 2+ bilat; no bruits. No lymphadenopathy or thryomegaly appreciated. Cor: PMI nondisplaced. Regular rate & rhythm. No rubs, gallops or murmurs. Lungs: clear Abdomen: soft, nontender, nondistended. No hepatosplenomegaly. No bruits or masses. Good bowel sounds. Extremities: no cyanosis, clubbing, rash, edema Neuro: alert & orientedx3, cranial nerves grossly intact. moves all 4 extremities w/o difficulty. Affect pleasant  Telemetry: NSR  Labs: Basic Metabolic Panel:  Recent Labs Lab 08/04/16 2115 08/04/16 2125 08/06/16 0439  NA 137 141 140  K 4.3 4.4 5.1  CL 98* 101 100*  CO2 26  --  26  GLUCOSE 163* 161* 144*  BUN 20 25* 45*  CREATININE 6.66* 6.50* 10.13*  CALCIUM 8.8*  --  8.8*    Liver Function Tests:  Recent Labs Lab 08/04/16 2115  AST 19  ALT  18  ALKPHOS 110  BILITOT 0.7  PROT 7.6  ALBUMIN 3.4*   No results for input(s): LIPASE, AMYLASE in the last 168 hours. No results for input(s): AMMONIA in the last 168 hours.  CBC:  Recent Labs Lab 08/04/16 2115 08/04/16 2125 08/06/16 0439  WBC 7.1  --  5.5  NEUTROABS 4.7  --   --   HGB 12.2* 13.3 10.5*  HCT 37.8* 39.0 32.7*  MCV 95.7  --  94.8  PLT 256  --  227    Cardiac Enzymes:  Recent Labs Lab 08/05/16 0152 08/05/16 0717 08/06/16 0439  TROPONINI 0.21* 0.32* 0.19*    BNP: BNP (last 3 results)  Recent Labs  08/09/15 0045  BNP 54.9    ProBNP (last 3 results) No results for input(s): PROBNP in the last 8760 hours.    Other results:  Imaging: Dg Chest 2 View  Result Date: 08/04/2016 CLINICAL DATA:  Acute onset of substernal chest pain and hypotension. Initial encounter. EXAM: CHEST  2 VIEW COMPARISON:  Chest radiograph from 06/18/2016 FINDINGS: The lungs are well-aerated. Vascular congestion is noted. Increased interstitial markings may reflect mild interstitial edema. There is no evidence of pleural effusion  or pneumothorax. The heart is normal in size; the mediastinal contour is within normal limits. No acute osseous abnormalities are seen. IMPRESSION: Vascular congestion noted. Increased interstitial markings may reflect mild interstitial edema. Electronically Signed   By: Garald Balding M.D.   On: 08/04/2016 21:47      Medications:     Scheduled Medications: . [MAR Hold] allopurinol  100 mg Oral Daily  . [MAR Hold] aspirin  81 mg Oral Daily  . [MAR Hold] cinacalcet  60 mg Oral BID WC  . [MAR Hold] citalopram  10 mg Oral Daily  . [MAR Hold] colesevelam  1,250 mg Oral BID WC  . [MAR Hold] ezetimibe  10 mg Oral Daily  . [MAR Hold] gabapentin  300-600 mg Oral QHS  . [MAR Hold] insulin aspart  0-9 Units Subcutaneous TID WC  . [MAR Hold] lanthanum  1,000 mg Oral BID WC  . [MAR Hold] multivitamin  1 tablet Oral QHS     Infusions: . sodium  chloride 1 mL (08/06/16 0809)  . heparin       PRN Medications:  sodium chloride, [MAR Hold] acetaminophen, clopidogrel, fentaNYL, heparin, hydrALAZINE, iopamidol, lidocaine (PF), midazolam, [MAR Hold] nitroGLYCERIN, [MAR Hold] ondansetron (ZOFRAN) IV   Assessment:   1. NSTEMI 2. CAD s/p PCI RCA in 2008 3. ESRD 4. DM2 5. HTN 6. Chronic diastolic HF  Plan/Discussion:    Cath results reviewed with him and his wife. He has probable high-grade (90%) heavily calcified mid LAD lesion. Will need Rotoblator.   Will plan HD later today (I discussed with Renal) and bring him back for PCI tomorrow. Start Plavix 300 x 1 then 75 mg daily. Unable to tolerate statins or b-blocker    Length of Stay: 1   Bensimhon, Daniel MD 08/06/2016, 8:59 AM  Advanced Heart Failure Team Pager [phone] (M-F; 7a - 4p)  Please contact [facility] for night-coverage after hours (4p -7a ) and weekends on [email or web address]

## 2016-08-07 NOTE — Interval H&P Note (Signed)
History and Physical Interval Note:  08/07/2016 7:46 AM  [name]  has presented today for surgery, with the diagnosis of CAD  The various methods of treatment have been discussed with the patient and family. After consideration of risks, benefits and other options for treatment, the patient has consented to  Procedure(s): Coronary/Graft Atherectomy-CSI LAD (N/A) as a surgical intervention .  The patient's history has been reviewed, patient examined, no change in status, stable for surgery.  I have reviewed the patient's chart and labs.  Questions were answered to the patient's satisfaction.    Cath Lab Visit (complete for each Cath Lab visit)  Clinical Evaluation Leading to the Procedure:   ACS: Yes.    Non-ACS:    Anginal Classification: CCS IV  Anti-ischemic medical therapy: Minimal Therapy (1 class of medications)  Non-Invasive Test Results: No non-invasive testing performed  Prior CABG: No previous CABG       Collier [facility]  08/07/2016 7:46 AM

## 2016-08-07 NOTE — Progress Notes (Signed)
PROGRESS NOTE    [name]  WUJ:[id number] DOB: January 28, 1951 DOA: 08/04/2016 PCP: [name], MD   Chief Complaint  Patient presents with  . Chest Pain    Brief Narrative:  HPI on 08/05/2016 by Dr. Lily Kocher [name] is a 65 y.o. gentleman with a history of combined systolic and diastolic heart failure, ESRD on HD T/Th/Sat, HTN, DM, and CAD with DES to RCA who has had recurrent problems with hypotension, particularly on dialysis days.  He has had to come off all of his anti-hypertensives, and he now takes midodrine on HD days and prn for systolic blood pressures less than 100.  Today, he developed chest pain after routine HD.  He reports a history of recurrent intermittent chest pain, but he became worried today when symptoms lasted longer than usual (about one hour total) and were associated with low blood pressures (his wife documented systolic blood pressure of 70 at home; the patient did not take an additional dose of midodrine at that time).  His wife called EMS, and the patient was brought to the ED for further evaluation.  He took four baby aspirin prior to arrival.  The patient's chest pain has not been associated with any shortness of breath, light-headedness, diaphoresis, or near-syncope.  He has felt generalized weakness but he has not fallen.  No nausea or vomiting.  No swelling. Assessment & Plan   Chest pain/Elevated troponin-NSTEMI/CAD -Occurred with dialysis and after straining during a bowel movement. ?related to hypotension -Troponin 0.21, 0.32, 0.19 -EKG showed lateral ST segment depression -Cardiology consulted and appreciated- LHC today: heavily calcified coronary arteries, high grade lesion in midland LAD 90%, midRCA 70-80%, LVEDP 17. Plan for possible PCI of LAD +/-RCA -intolerant to statin and beta-blockers -s/p PCI to LAD with rotoblator -Continue plavix  ESRD  -Hemodialysis on TTS -Patient cut short his HD treatment on 11/4 by 12 minutes.  States he is  very compliant with HD. -Nephrology consulted and appreciated -Patient did dialyze on 11/6  Diabetes mellitus, type II with neuropathy -Continue ISS and CBG monitoring -Restarted gabapentin -Repaglinide held  Depression -Continue celexa  Gout -Continue allopurinol  Chronic diastolic heart failure  -Echocardiogram in 01/12/2015 EF 95-62%, grade 1 diastolic dysfunction -Currently euvolemic -Continue control with HD  OSA -Continue CPAP QHS  DVT Prophylaxis  SCDs  Code Status: Full  Family Communication: Wife at bedside  Disposition Plan:  Admitted. Possibly discharge to home on 11/8  Consultants Cardiology  Procedures  Cardiac catheterization PCI   Antibiotics   Anti-infectives    None      Subjective:   [name] seen and examined today.  Patient denies current chest pain. Feels very sleepy.  Denies shortness of breath, abdominal pain, N/V/D/C, dizziness, headache.   Objective:   Vitals:   08/07/16 1129 08/07/16 1130 08/07/16 1145 08/07/16 1200  BP: (!) 182/86 (!) 182/86 (!) 175/80 (!) 150/75  Pulse: 69 69 74 71  Resp: 16 16 (!) 23 15  Temp: 97 F (36.1 C)     TempSrc: Axillary     SpO2: 100% 100% 100% 99%  Weight:      Height:        Intake/Output Summary (Last 24 hours) at 08/07/16 1247 Last data filed at 08/07/16 0500  Gross per 24 hour  Intake              480 ml  Output             2001 ml  Net            -1521 ml   Filed Weights   08/06/16 2211 08/06/16 2248 08/07/16 0500  Weight: 104.7 kg (230 lb 13.2 oz) 106.2 kg (234 lb 1.6 oz) 106.2 kg (234 lb 3.2 oz)    Exam  General: Well developed, well nourished, No distress  HEENT: NCAT,  mucous membranes moist.   Cardiovascular: S1 S2 auscultated, RRR, no murmrus  Respiratory: Clear to auscultation bilaterally with equal chest rise  Abdomen: Soft, nontender, nondistended, + bowel sounds  Extremities: warm dry without cyanosis clubbing or edema  Neuro: AAOx3, nonfocal  Psych:  Normal affect and demeanor, pleasant    Data Reviewed: I have personally reviewed following labs and imaging studies  CBC:  Recent Labs Lab 08/04/16 2115 08/04/16 2125 08/06/16 0439 08/07/16 0457  WBC 7.1  --  5.5 5.4  NEUTROABS 4.7  --   --   --   HGB 12.2* 13.3 10.5* 10.0*  HCT 37.8* 39.0 32.7* 31.2*  MCV 95.7  --  94.8 95.7  PLT 256  --  227 099   Basic Metabolic Panel:  Recent Labs Lab 08/04/16 2115 08/04/16 2125 08/06/16 0439 08/06/16 1826  NA 137 141 140 140  K 4.3 4.4 5.1 5.0  CL 98* 101 100* 101  CO2 26  --  26 25  GLUCOSE 163* 161* 144* 119*  BUN 20 25* 45* 56*  CREATININE 6.66* 6.50* 10.13* 11.22*  CALCIUM 8.8*  --  8.8* 8.5*   GFR: Estimated Creatinine Clearance: 8 mL/min (by C-G formula based on SCr of 11.22 mg/dL (H)). Liver Function Tests:  Recent Labs Lab 08/04/16 2115  AST 19  ALT 18  ALKPHOS 110  BILITOT 0.7  PROT 7.6  ALBUMIN 3.4*   No results for input(s): LIPASE, AMYLASE in the last 168 hours. No results for input(s): AMMONIA in the last 168 hours. Coagulation Profile:  Recent Labs Lab 08/06/16 0459  INR 0.96   Cardiac Enzymes:  Recent Labs Lab 08/05/16 0152 08/05/16 0717 08/06/16 0439  TROPONINI 0.21* 0.32* 0.19*   BNP (last 3 results) No results for input(s): PROBNP in the last 8760 hours. HbA1C: No results for input(s): HGBA1C in the last 72 hours. CBG:  Recent Labs Lab 08/05/16 2104 08/06/16 1135 08/06/16 1613 08/06/16 2244 08/07/16 1207  GLUCAP 220* 210* 132* 155* 111*   Lipid Profile: No results for input(s): CHOL, HDL, LDLCALC, TRIG, CHOLHDL, LDLDIRECT in the last 72 hours. Thyroid Function Tests: No results for input(s): TSH, T4TOTAL, FREET4, T3FREE, THYROIDAB in the last 72 hours. Anemia Panel: No results for input(s): VITAMINB12, FOLATE, FERRITIN, TIBC, IRON, RETICCTPCT in the last 72 hours. Urine analysis:    Component Value Date/Time   COLORURINE LT. YELLOW 06/26/2013 1200   APPEARANCEUR  CLEAR 06/26/2013 1200   LABSPEC 1.020 06/26/2013 1200   PHURINE 8.5 06/26/2013 1200   GLUCOSEU 100 06/26/2013 1200   HGBUR MODERATE 06/26/2013 1200   BILIRUBINUR NEGATIVE 06/26/2013 1200   KETONESUR NEGATIVE 06/26/2013 1200   PROTEINUR >300 (A) 01/04/2010 1032   UROBILINOGEN 0.2 06/26/2013 1200   NITRITE NEGATIVE 06/26/2013 1200   LEUKOCYTESUR TRACE 06/26/2013 1200   Sepsis Labs: @LABRCNTIP (procalcitonin:4,lacticidven:4)  )No results found for this or any previous visit (from the past 240 hour(s)).    Radiology Studies: No results found.   Scheduled Meds: . allopurinol  100 mg Oral Daily  . aspirin  81 mg Oral Daily  . cinacalcet  60 mg Oral BID WC  . citalopram  10 mg Oral Daily  . clopidogrel  75 mg Oral Q breakfast  . colesevelam  1,250 mg Oral BID WC  . ezetimibe  10 mg Oral Daily  . gabapentin  300-600 mg Oral QHS  . insulin aspart  0-9 Units Subcutaneous TID WC  . lanthanum  1,000 mg Oral BID WC  . multivitamin  1 tablet Oral QHS  . sodium chloride flush  3 mL Intravenous Q12H  . sodium chloride flush  3 mL Intravenous Q12H  . sodium chloride flush  3 mL Intravenous Q12H   Continuous Infusions:    LOS: 2 days   Time Spent in minutes   30 minutes  Clarinda Obi D.O. on 08/07/2016 at 12:47 PM  Between 7am to 7pm - Pager - [phone]  After 7pm go to [email or web address] - password [email or web address]  And look for the night coverage person covering for me after hours  [facility] Office  [phone]

## 2016-08-07 NOTE — Progress Notes (Signed)
Patient ID: [name], male   DOB: 24-Jul-1951, 65 y.o.   MRN: [id number]  [facility] Progress Note    Subjective:   Cath results noted.  [name] is resting comfortably and does not want HD tonight because he is sore and had it yesterday.   Objective:   BP (!) 150/75   Pulse 71   Temp 97 F (36.1 C) (Axillary)   Resp 15   Ht 6' (1.829 m)   Wt 106.2 kg (234 lb 3.2 oz)   SpO2 99%   BMI 31.76 kg/m   Intake/Output: I/O last 3 completed shifts: In: 960 [P.O.:960] Out: 2001 [Other:2000; Stool:1]   Intake/Output this shift:  No intake/output data recorded. Weight change: -0.031 kg (-1.1 oz)  Physical Exam: Gen:WD WN AAM in NAD wearing CPAP machine CVS:no rub Resp:cta RAQ:TMAUQJ Ext:no edema, RUE AVF +T/B,   Labs: BMET  Recent Labs Lab 08/04/16 2115 08/04/16 2125 08/06/16 0439 08/06/16 1826  NA 137 141 140 140  K 4.3 4.4 5.1 5.0  CL 98* 101 100* 101  CO2 26  --  26 25  GLUCOSE 163* 161* 144* 119*  BUN 20 25* 45* 56*  CREATININE 6.66* 6.50* 10.13* 11.22*  ALBUMIN 3.4*  --   --   --   CALCIUM 8.8*  --  8.8* 8.5*   CBC  Recent Labs Lab 08/04/16 2115 08/04/16 2125 08/06/16 0439 08/07/16 0457  WBC 7.1  --  5.5 5.4  NEUTROABS 4.7  --   --   --   HGB 12.2* 13.3 10.5* 10.0*  HCT 37.8* 39.0 32.7* 31.2*  MCV 95.7  --  94.8 95.7  PLT 256  --  227 208    @IMGRELPRIORS @ Medications:    . allopurinol  100 mg Oral Daily  . aspirin  81 mg Oral Daily  . cinacalcet  60 mg Oral BID WC  . citalopram  10 mg Oral Daily  . clopidogrel  75 mg Oral Q breakfast  . colesevelam  1,250 mg Oral BID WC  . ezetimibe  10 mg Oral Daily  . gabapentin  300-600 mg Oral QHS  . insulin aspart  0-9 Units Subcutaneous TID WC  . lanthanum  1,000 mg Oral BID WC  . multivitamin  1 tablet Oral QHS  . sodium chloride flush  3 mL Intravenous Q12H  . sodium chloride flush  3 mL Intravenous Q12H  . sodium chloride flush  3 mL Intravenous Q12H   Dialysis Orders:  [facility]: [facility]   on TTS . EDW 105.5 kg HD Bath 2k, 2ca  Time 4hrs Heparin no hep. . Access RUA AVF  uf profile 4  [name] 3 mcg IV/HD     Other op labs hgb 11.6  (08-02-16) . Ca 9.1  Phos 3.1 pth 410  Assessment/Plan 1. Chest pain / NSTEMI/ CAD- noted now sp Card cath with plans for possible PCI of LAD +/-RCA/ and intolerant to statin and beta-blockers 1. S/p rotoblation of LAD 2. Disposition per cardiology. 2. ESRD -   normally TTS.  off schedule sec to cath  1. will back on schedule later this week.  He declines any HD today since he finished lat yesterday and is sore.  If he remains an inpatient will plan for HD tomorrow otherwise he can f/u with his outpatient unit on Thursday.    3. Hypertension/volume  - keep sbp >100  With hd today  Currently 142/70 4. Anemia  -  hgb 10.5 ,no  esa as op monitor hgb  5. Metabolic bone disease -  IV hec on hd and Fosrenol binder  6. DM type 2 - per admit  7. HO OSA - cpap  Per admit   Donetta Potts, MD [place] Pager [phone] 08/07/2016, 12:39 PM

## 2016-08-07 NOTE — Progress Notes (Signed)
Site area: right groin- venous and arterial   Site Prior to Removal:  Level 0  Pressure Applied For 25 MINUTES  (10 arterial and venous)  Minutes Beginning at 2145  Manual:   Yes.    Patient Status During Pull:  Uncooperative, unable to follow directions   Post Pull Groin Site:  Level 0  Post Pull Instructions Given:  Yes.    Post Pull Pulses Present:  Yes.    Dressing Applied:  Yes.    Comments:  Pt states he cannot handle any pain and wants to be "put to sleep during sheath pull" pt reassured he was premedicated. After approx 3 minutes bleeding was controlled.

## 2016-08-07 NOTE — Progress Notes (Signed)
Advanced Heart Failure Rounding Note   Subjective:     Had HD last night.   Successful PCI of LAD today with rotoblator. No CP or dyspnea   Objective:   Weight Range:  Vital Signs:   Temp:  [97.1 F (36.2 C)-98.6 F (37 C)] 97.1 F (36.2 C) (11/07 1013) Pulse Rate:  [0-100] 70 (11/07 1030) Resp:  [0-26] 26 (11/07 1030) BP: (112-181)/(58-100) 181/78 (11/07 1030) SpO2:  [0 %-100 %] 97 % (11/07 1030) Weight:  [104.7 kg (230 lb 13.2 oz)-106.7 kg (235 lb 3.7 oz)] 106.2 kg (234 lb 3.2 oz) (11/07 0500) Last BM Date: 08/06/16  Weight change: Filed Weights   08/06/16 2211 08/06/16 2248 08/07/16 0500  Weight: 104.7 kg (230 lb 13.2 oz) 106.2 kg (234 lb 1.6 oz) 106.2 kg (234 lb 3.2 oz)    Intake/Output:   Intake/Output Summary (Last 24 hours) at 08/07/16 1127 Last data filed at 08/07/16 0500  Gross per 24 hour  Intake              480 ml  Output             2001 ml  Net            -1521 ml     Physical Exam: General:  Well appearing. No resp difficulty HEENT: normal Neck: supple. JVP 7 . Carotids 2+ bilat; no bruits. No lymphadenopathy or thryomegaly appreciated. Cor: PMI nondisplaced. Regular rate & rhythm. No rubs, gallops or murmurs. Lungs: clear Abdomen: soft, nontender, nondistended. No hepatosplenomegaly. No bruits or masses. Good bowel sounds. Extremities: no cyanosis, clubbing, rash, edema Neuro: alert & orientedx3, cranial nerves grossly intact. moves all 4 extremities w/o difficulty. Affect pleasant  Telemetry: NSR  Labs: Basic Metabolic Panel:  Recent Labs Lab 08/04/16 2115 08/04/16 2125 08/06/16 0439 08/06/16 1826  NA 137 141 140 140  K 4.3 4.4 5.1 5.0  CL 98* 101 100* 101  CO2 26  --  26 25  GLUCOSE 163* 161* 144* 119*  BUN 20 25* 45* 56*  CREATININE 6.66* 6.50* 10.13* 11.22*  CALCIUM 8.8*  --  8.8* 8.5*    Liver Function Tests:  Recent Labs Lab 08/04/16 2115  AST 19  ALT 18  ALKPHOS 110  BILITOT 0.7  PROT 7.6  ALBUMIN 3.4*    No results for input(s): LIPASE, AMYLASE in the last 168 hours. No results for input(s): AMMONIA in the last 168 hours.  CBC:  Recent Labs Lab 08/04/16 2115 08/04/16 2125 08/06/16 0439 08/07/16 0457  WBC 7.1  --  5.5 5.4  NEUTROABS 4.7  --   --   --   HGB 12.2* 13.3 10.5* 10.0*  HCT 37.8* 39.0 32.7* 31.2*  MCV 95.7  --  94.8 95.7  PLT 256  --  227 208    Cardiac Enzymes:  Recent Labs Lab 08/05/16 0152 08/05/16 0717 08/06/16 0439  TROPONINI 0.21* 0.32* 0.19*    BNP: BNP (last 3 results)  Recent Labs  08/09/15 0045  BNP 54.9    ProBNP (last 3 results) No results for input(s): PROBNP in the last 8760 hours.    Other results:  Imaging: No results found.   Medications:     Scheduled Medications: . allopurinol  100 mg Oral Daily  . aspirin  81 mg Oral Daily  . cinacalcet  60 mg Oral BID WC  . citalopram  10 mg Oral Daily  . clopidogrel  75 mg Oral Q breakfast  . colesevelam  1,250 mg Oral BID WC  . ezetimibe  10 mg Oral Daily  . gabapentin  300-600 mg Oral QHS  . insulin aspart  0-9 Units Subcutaneous TID WC  . lanthanum  1,000 mg Oral BID WC  . multivitamin  1 tablet Oral QHS  . sodium chloride flush  3 mL Intravenous Q12H  . sodium chloride flush  3 mL Intravenous Q12H  . sodium chloride flush  3 mL Intravenous Q12H    Infusions:   PRN Medications: sodium chloride, sodium chloride, sodium chloride, acetaminophen, hydrALAZINE, HYDROcodone-acetaminophen, nitroGLYCERIN, ondansetron (ZOFRAN) IV, sodium chloride flush, sodium chloride flush, sodium chloride flush   Assessment:   1. NSTEMI 2. CAD s/p PCI RCA in 2008     --s/p PCI LAD today 3. ESRD 4. DM2 5. HTN 6. Chronic diastolic HF  Plan/Discussion:    S/p PCI of LAD. Looks good. Volume status stable. Home in am.  Continue Plavix 75 mg daily. Unable to tolerate statins or b-blocker  Consult cardiac rehab. Home in am.   D/w Dr. Ree Kida and Dr. [place].   Length of Stay:  2   Glori Bickers MD 08/07/2016, 11:27 AM  Advanced Heart Failure Team Pager [phone] (M-F; 7a - 4p)  Please contact [facility] for night-coverage after hours (4p -7a ) and weekends on [email or web address]

## 2016-08-08 ENCOUNTER — Ambulatory Visit: Payer: Medicare Other | Admitting: Cardiovascular Disease

## 2016-08-08 DIAGNOSIS — Z955 Presence of coronary angioplasty implant and graft: Secondary | ICD-10-CM

## 2016-08-08 LAB — BASIC METABOLIC PANEL
Anion gap: 13 (ref 5–15)
BUN: 35 mg/dL — AB (ref 6–20)
CALCIUM: 8.8 mg/dL — AB (ref 8.9–10.3)
CHLORIDE: 98 mmol/L — AB (ref 101–111)
CO2: 25 mmol/L (ref 22–32)
CREATININE: 8.62 mg/dL — AB (ref 0.61–1.24)
GFR calc Af Amer: 7 mL/min — ABNORMAL LOW (ref >60)
GFR calc non Af Amer: 6 mL/min — ABNORMAL LOW (ref >60)
GLUCOSE: 140 mg/dL — AB (ref 65–99)
Potassium: 5.1 mmol/L (ref 3.5–5.1)
Sodium: 136 mmol/L (ref 135–145)

## 2016-08-08 LAB — GLUCOSE, CAPILLARY
GLUCOSE-CAPILLARY: 144 mg/dL — AB (ref 65–99)
GLUCOSE-CAPILLARY: 142 mg/dL — AB (ref 65–99)

## 2016-08-08 LAB — CBC
HCT: 29.8 % — ABNORMAL LOW (ref 39.0–52.0)
Hemoglobin: 9.7 g/dL — ABNORMAL LOW (ref 13.0–17.0)
MCH: 30.7 pg (ref 26.0–34.0)
MCHC: 32.6 g/dL (ref 30.0–36.0)
MCV: 94.3 fL (ref 78.0–100.0)
PLATELETS: 221 10*3/uL (ref 150–400)
RBC: 3.16 MIL/uL — ABNORMAL LOW (ref 4.22–5.81)
RDW: 16.4 % — ABNORMAL HIGH (ref 11.5–15.5)
WBC: 5.9 10*3/uL (ref 4.0–10.5)

## 2016-08-08 MED ORDER — HEART ATTACK BOUNCING BOOK
Freq: Once | Status: AC
  Administered 2016-08-08: 02:00:00
  Filled 2016-08-08: qty 1

## 2016-08-08 MED ORDER — CLOPIDOGREL BISULFATE 75 MG PO TABS: 75.0000 mg | ORAL_TABLET | Freq: Every day | ORAL | 0 refills | Status: DC

## 2016-08-08 MED ORDER — ANGIOPLASTY BOOK
Freq: Once | Status: AC
  Administered 2016-08-08: 02:00:00
  Filled 2016-08-08: qty 1

## 2016-08-08 MED ORDER — LIVING BETTER WITH HEART FAILURE BOOK
Freq: Once | Status: AC
  Administered 2016-08-08: 03:00:00

## 2016-08-08 NOTE — Discharge Summary (Signed)
Physician Discharge Summary  [name] XKG:[id number] DOB: 04-Nov-1950 DOA: 08/04/2016  PCP: [name] Cower, MD  Admit date: 08/04/2016 Discharge date: 08/08/2016  Time spent: 45 minutes  Recommendations for Outpatient Follow-up:  Patient will be discharged to home.  Patient will need to follow up with primary care provider within one week of discharge.  Continue dialysis as scheduled.  Follow up with cardiology. Patient should continue medications as prescribed.  Patient should follow a heart healthy diet.   Discharge Diagnoses:  Chest pain/Elevated troponin-NSTEMI/CAD ESRD  Diabetes mellitus, type II with neuropathy Depression Gout Chronic diastolic heart failure OSA  Discharge Condition: stable  Diet recommendation: heart healthy  Filed Weights   08/06/16 2211 08/06/16 2248 08/07/16 0500  Weight: 104.7 kg (230 lb 13.2 oz) 106.2 kg (234 lb 1.6 oz) 106.2 kg (234 lb 3.2 oz)    History of present illness:  on 08/05/2016 by Dr. Council [name] a 65 y.o.gentleman with a history of combined systolic and diastolic heart failure, ESRD on HD T/Th/Sat, HTN, DM, and CAD with DES to RCA who has had recurrent problems with hypotension, particularly on dialysis days. He has had to come off all of his anti-hypertensives, and he now takes midodrine on HD days and prn for systolic blood pressures less than 100. Today, he developed chest pain after routine HD. He reports a history of recurrent intermittent chest pain, but he became worried today when symptoms lasted longer than usual (about one hour total) and were associated with low blood pressures (his wife documented systolic blood pressure of 70 at home; the patient did not take an additional dose of midodrine at that time). His wife called EMS, and the patient was brought to the ED for further evaluation. He took four baby aspirin prior to arrival.  The patient's chest pain has not been associated with any shortness of  breath, light-headedness, diaphoresis, or near-syncope. He has felt generalized weakness but he has not fallen. No nausea or vomiting. No swelling.  Hospital Course:  Chest pain/Elevated troponin-NSTEMI/CAD -Occurred with dialysis and after straining during a bowel movement. ?related to hypotension -Troponin 0.21, 0.32, 0.19 -EKG showed lateral ST segment depression -Cardiology consulted and appreciated- LHC today: heavily calcified coronary arteries, high grade lesion in midland LAD 90%, midRCA 70-80%, LVEDP 17. Plan for possible PCI of LAD +/-RCA -intolerant to statin and beta-blockers -s/p PCI to LAD with rotoblator -Continue plavix  ESRD  -Hemodialysis on TTS -Patient cut short his HD treatment on 11/4 by 12 minutes.  States he is very compliant with HD. -Nephrology consulted and appreciated -Patient did dialyze on 11/6  Diabetes mellitus, type II with neuropathy -Continue ISS and CBG monitoring -Restarted gabapentin -Repaglinide held  Depression -Continue celexa  Gout -Continue allopurinol  Chronic diastolic heart failure  -Echocardiogram in 01/12/2015 EF 70-26%, grade 1 diastolic dysfunction -Currently euvolemic -Continue control with HD  OSA -Continue CPAP QHS  Consultants Cardiology Nephrology  Procedures  Cardiac catheterization PCI   Discharge Exam: Vitals:   08/08/16 0729 08/08/16 0800  BP: (!) 159/74   Pulse: 81   Resp: 17 (!) 21  Temp: 98 F (36.7 C)    Patient denies current chest pain. Feels very sleepy.  Denies shortness of breath, abdominal pain, N/V/D/C, dizziness, headache.    Exam  General: Well developed, well nourished, No distress  HEENT: NCAT,  mucous membranes moist.   Cardiovascular: S1 S2 auscultated, RRR, no murmrus  Respiratory: Clear to auscultation bilaterally with equal chest rise  Abdomen: Soft, nontender, nondistended, + bowel sounds  Extremities: warm dry without cyanosis clubbing or edema  Neuro: AAOx3,  nonfocal  Psych: Normal affect and demeanor, pleasant   Discharge Instructions Discharge Instructions    AMB Referral to Cardiac Rehabilitation - Phase II    Complete by:  As directed    Diagnosis:   Coronary Stents NSTEMI     Amb Referral to Cardiac Rehabilitation    Complete by:  As directed    Do not call until January per pt   Diagnosis:   NSTEMI Coronary Stents     Discharge instructions    Complete by:  As directed    Patient will be discharged to home.  Patient will need to follow up with primary care provider within one week of discharge.  Continue dialysis as scheduled.  Follow up with cardiology. Patient should continue medications as prescribed.  Patient should follow a heart healthy diet.     Current Discharge Medication List    START taking these medications   Details  clopidogrel (PLAVIX) 75 MG tablet Take 1 tablet (75 mg total) by mouth daily with breakfast. Qty: 30 tablet, Refills: 0      CONTINUE these medications which have NOT CHANGED   Details  allopurinol (ZYLOPRIM) 100 MG tablet Take 1 tablet (100 mg total) by mouth daily. Qty: 90 tablet, Refills: 3    aspirin 81 MG tablet Take 81 mg by mouth daily.    cinacalcet (SENSIPAR) 60 MG tablet Take 60 mg by mouth 2 (two) times daily.     colesevelam (WELCHOL) 625 MG tablet TAKE THREE TABLETS BY MOUTH TWICE DAILY WITH MEALS Qty: 180 tablet, Refills: 11    ezetimibe (ZETIA) 10 MG tablet Take 1 tablet (10 mg total) by mouth daily. Qty: 90 tablet, Refills: 3    gabapentin (NEURONTIN) 300 MG capsule 1-2 tabs by mouth at bedtime for sleep and pain Qty: 180 capsule, Refills: 3    lanthanum (FOSRENOL) 1000 MG chewable tablet Chew 1,000 mg by mouth 2 (two) times daily with a meal. Reported on 12/23/2015    midodrine (PROAMATINE) 2.5 MG tablet Take 1 tablet (2.5 mg total) by mouth 2 (two) times daily with a meal. ONLY ON HD DAY OR FOR BP LESS THAN 90 Qty: 15 tablet, Refills: 3    multivitamin (RENA-VIT) TABS  tablet Take 1 tablet by mouth daily. Reported on 10/24/2015    repaglinide (PRANDIN) 0.5 MG tablet Take 1 tablet (0.5 mg total) by mouth daily with breakfast. Qty: 30 tablet, Refills: 11    citalopram (CELEXA) 10 MG tablet Take 1 tablet (10 mg total) by mouth daily. Qty: 90 tablet, Refills: 3      STOP taking these medications     acetaminophen (TYLENOL) 325 MG tablet        Allergies  Allergen Reactions  . Cephalexin Swelling and Other (See Comments)    Tongue swelling  . Statins Other (See Comments)    Weak muscles  . Ciprofloxacin Rash   Follow-up Information    Bensimhon, Daniel, MD Follow up in 3 month(s).   Specialty:  Cardiology Why:  The office will call you for an appointment. If you have not heard from [place] by January, please call to schedule.  Contact information: [address] [place] [address] [phone]        Quay Burow, MD Follow up on 08/29/2016.   Specialties:  Cardiology, Radiology Why:  Cardiology Follow-Up on 08/29/2016 at 2:00PM. Contact  information: [address] [place] [address] [phone]        [name] Cower, MD. Schedule an appointment as soon as possible for a visit in 1 week(s).   Specialties:  Internal Medicine, Radiology Why:  Hospital follow up Contact information: [place] [address] [phone]            The results of significant diagnostics from this hospitalization (including imaging, microbiology, ancillary and laboratory) are listed below for reference.    Significant Diagnostic Studies: Dg Chest 2 View  Result Date: 08/04/2016 CLINICAL DATA:  Acute onset of substernal chest pain and hypotension. Initial encounter. EXAM: CHEST  2 VIEW COMPARISON:  Chest radiograph from 06/18/2016 FINDINGS: The lungs are well-aerated. Vascular congestion is noted. Increased interstitial markings may reflect mild interstitial edema. There is no evidence of pleural  effusion or pneumothorax. The heart is normal in size; the mediastinal contour is within normal limits. No acute osseous abnormalities are seen. IMPRESSION: Vascular congestion noted. Increased interstitial markings may reflect mild interstitial edema. Electronically Signed   By: Garald Balding M.D.   On: 08/04/2016 21:47    Microbiology: No results found for this or any previous visit (from the past 240 hour(s)).   Labs: Basic Metabolic Panel:  Recent Labs Lab 08/04/16 2115 08/04/16 2125 08/06/16 0439 08/06/16 1826 08/08/16 0359  NA 137 141 140 140 136  K 4.3 4.4 5.1 5.0 5.1  CL 98* 101 100* 101 98*  CO2 26  --  26 25 25   GLUCOSE 163* 161* 144* 119* 140*  BUN 20 25* 45* 56* 35*  CREATININE 6.66* 6.50* 10.13* 11.22* 8.62*  CALCIUM 8.8*  --  8.8* 8.5* 8.8*   Liver Function Tests:  Recent Labs Lab 08/04/16 2115  AST 19  ALT 18  ALKPHOS 110  BILITOT 0.7  PROT 7.6  ALBUMIN 3.4*   No results for input(s): LIPASE, AMYLASE in the last 168 hours. No results for input(s): AMMONIA in the last 168 hours. CBC:  Recent Labs Lab 08/04/16 2115 08/04/16 2125 08/06/16 0439 08/07/16 0457 08/08/16 0359  WBC 7.1  --  5.5 5.4 5.9  NEUTROABS 4.7  --   --   --   --   HGB 12.2* 13.3 10.5* 10.0* 9.7*  HCT 37.8* 39.0 32.7* 31.2* 29.8*  MCV 95.7  --  94.8 95.7 94.3  PLT 256  --  227 208 221   Cardiac Enzymes:  Recent Labs Lab 08/05/16 0152 08/05/16 0717 08/06/16 0439  TROPONINI 0.21* 0.32* 0.19*   BNP: BNP (last 3 results) No results for input(s): BNP in the last 8760 hours.  ProBNP (last 3 results) No results for input(s): PROBNP in the last 8760 hours.  CBG:  Recent Labs Lab 08/07/16 1207 08/07/16 1659 08/07/16 2054 08/08/16 0606 08/08/16 1213  GLUCAP 111* 107* 97 144* 142*       Signed:  Brayden Betters  [facility] 08/08/2016, 12:38 PM

## 2016-08-08 NOTE — Progress Notes (Signed)
    Clarified with Dr. Tamala Julian the patient is good for discharge from a Cardiac perspective. Will arrange for outpatient follow-up.   Signed, Erma Heritage, PA-C 08/08/2016, 12:08 PM Pager: [phone]

## 2016-08-08 NOTE — Discharge Instructions (Signed)
Coronary Artery Disease, Male  Coronary artery disease (CAD) is a process in which the blood vessels of the heart (coronary arteries) become narrow or blocked. The narrowing or blockage can lead to decreased blood flow to the heart muscle (angina). Prolonged reduced blood flow can cause a heart attack (myocardial infarction, MI). Because CAD is the leading cause of death in men, it is important to understand what causes this condition and how it is treated.  CAUSES  Atherosclerosis is the cause of CAD. This is the buildup of fat and cholesterol (plaque) on the inside of the arteries. Over time, the plaque may narrow or block the artery, and this will lessen blood flow to the heart. Plaque can also become weak and break off within a coronary artery to form a clot and cause a sudden blockage.  RISK FACTORS  Many risk factors increase your chances of getting CAD, including:  · High cholesterol levels.  · High blood pressure (hypertension).  · Tobacco use.  · Diabetes.  · Age. Men over age 45 are at a greater risk of CAD.  · Gender. Men often develop CAD earlier in life than women.  · Family history of CAD.  · Obesity.  · Lack of exercise.  · A diet high in saturated fats.  SYMPTOMS   Many people do not experience any symptoms during the early stages of CAD. As the condition progresses, symptoms may include:   · Chest pain.  ¨ The pain can be described as a crushing or squeezing in the chest, or a tightness, pressure, fullness, or heaviness in the chest.  ¨ The pain can last more than a few minutes or can stop and recur.   · Pain in the arms, neck, jaw, or back.  · Unexplained heartburn or indigestion.  · Shortness of breath.  · Nausea.  · Sudden cold sweats.   Less common symptoms of CAD in men can include:  · Fatigue.  · Unexplained feelings of nervousness or anxiety.  · Weakness.  · Diarrhea.  · Sudden light-headedness.  DIAGNOSIS   Tests to diagnose CAD may include:  · ECG (electrocardiogram).  · Exercise stress  test. This looks for signs of blockage when the heart is being exercised.  · Pharmacologic stress test. This test looks for signs of blockage when the heart is being stressed with a medicine.  · Blood tests.  · Coronary angiogram. This is a procedure to look at the coronary arteries to see if there is any blockage.  TREATMENT  The treatment of CAD may include the following:  · Healthy behavioral changes to reduce or control risk factors.  · Medicine.  · Coronary stenting. A stent helps to keep an artery open.  · Coronary angioplasty. This procedure widens a narrowed or blocked artery.  · Coronary artery bypass surgery. This will allow your blood to pass the blockage (bypass) to reach your heart.  HOME CARE INSTRUCTIONS  · Take medicines only as directed by your health care provider.  · Do not take the following medicines unless your health care provider approves:    Nonsteroidal anti-inflammatory drugs (NSAIDs), such as ibuprofen, naproxen, or celecoxib.    Vitamin supplements that contain vitamin A, vitamin E, or both.  · Manage other health conditions such as hypertension and diabetes as directed by your health care provider.  · Follow a heart-healthy diet. A dietitian can help to educate you about healthy food options and changes.  · Use healthy cooking methods such as roasting, grilling, broiling,   baking, poaching, steaming, or stir-frying. Talk to a dietitian to learn more about healthy cooking methods.  · Follow an exercise program approved by your health care provider.  · Maintain a healthy weight. Lose weight as approved by your health care provider.  · Plan rest periods when fatigued.  · Learn to manage stress.  · Do not use any tobacco products, including cigarettes, chewing tobacco, or electronic cigarettes. If you need help quitting, ask your health care provider.  · If you drink alcohol, and your health care provider approves, limit your alcohol intake to no more than 1 drink per day. One drink equals  12 ounces of beer, 5 ounces of wine, or 1½ ounces of hard liquor.  · Stop illegal drug use.  · Your health care provider may ask you to monitor your blood pressure. A blood pressure reading consists of a higher number over a lower number, such as 110 over 72, which is written as 110/72. Ideally, your blood pressure should be:    Below 140/90 if you have no other medical conditions.    Below 130/80 if you have diabetes or kidney disease.  · Keep all follow-up visits as directed by your health care provider. This is important.  SEEK IMMEDIATE MEDICAL CARE IF:  · You have pain in your chest, neck, arm, jaw, stomach, or back that lasts more than a few minutes, is recurring, or is unrelieved by taking medicine under your tongue (sublingual nitroglycerin).  · You have profuse sweating without cause.  · You have unexplained:    Heartburn or indigestion.    Shortness of breath or difficulty breathing.    Nausea or vomiting.    Fatigue.    Feelings of nervousness or anxiety.    Weakness.    Diarrhea.  · You have sudden light-headedness or dizziness.  · You faint.  These symptoms may represent a serious problem that is an emergency. Do not wait to see if the symptoms will go away. Get medical help right away. Call your local emergency services (911 in the [place] Do not drive yourself to the hospital.     This information is not intended to replace advice given to you by your health care provider. Make sure you discuss any questions you have with your health care provider.     Document Released: 04/14/2014 Document Reviewed: 04/14/2014  Elsevier Interactive Patient Education ©2016 [employer] Inc.

## 2016-08-08 NOTE — Progress Notes (Signed)
Advanced Heart Failure Rounding Note   Subjective:     Successful PCI of LAD yesterday with rotoblator. Feels good. Wants to go home.    Objective:   Weight Range:  Vital Signs:   Temp:  [97.7 F (36.5 C)-98 F (36.7 C)] 98 F (36.7 C) (11/08 0729) Pulse Rate:  [67-93] 81 (11/08 0729) Resp:  [12-24] 21 (11/08 0800) BP: (80-185)/(43-121) 159/74 (11/08 0729) SpO2:  [90 %-99 %] 96 % (11/08 0729) Last BM Date: 08/07/16  Weight change: Filed Weights   08/06/16 2211 08/06/16 2248 08/07/16 0500  Weight: 230 lb 13.2 oz (104.7 kg) 234 lb 1.6 oz (106.2 kg) 234 lb 3.2 oz (106.2 kg)    Intake/Output:   Intake/Output Summary (Last 24 hours) at 08/08/16 1545 Last data filed at 08/07/16 2230  Gross per 24 hour  Intake              500 ml  Output                0 ml  Net              500 ml     Physical Exam: General:  Lying flat. No resp difficulty HEENT: normal Neck: supple. JVP 8 . Carotids 2+ bilat; no bruits. No lymphadenopathy or thryomegaly appreciated. Cor: PMI nondisplaced. Regular rate & rhythm. No rubs, gallops or murmurs. Lungs: clear Abdomen: soft, nontender, nondistended. No hepatosplenomegaly. No bruits or masses. Good bowel sounds. Extremities: no cyanosis, clubbing, rash, edema Neuro: alert & orientedx3, cranial nerves grossly intact. moves all 4 extremities w/o difficulty. Affect pleasant  Telemetry: NSR  Labs: Basic Metabolic Panel:  Recent Labs Lab 08/04/16 2115 08/04/16 2125 08/06/16 0439 08/06/16 1826 08/08/16 0359  NA 137 141 140 140 136  K 4.3 4.4 5.1 5.0 5.1  CL 98* 101 100* 101 98*  CO2 26  --  26 25 25   GLUCOSE 163* 161* 144* 119* 140*  BUN 20 25* 45* 56* 35*  CREATININE 6.66* 6.50* 10.13* 11.22* 8.62*  CALCIUM 8.8*  --  8.8* 8.5* 8.8*    Liver Function Tests:  Recent Labs Lab 08/04/16 2115  AST 19  ALT 18  ALKPHOS 110  BILITOT 0.7  PROT 7.6  ALBUMIN 3.4*   No results for input(s): LIPASE, AMYLASE in the last 168  hours. No results for input(s): AMMONIA in the last 168 hours.  CBC:  Recent Labs Lab 08/04/16 2115 08/04/16 2125 08/06/16 0439 08/07/16 0457 08/08/16 0359  WBC 7.1  --  5.5 5.4 5.9  NEUTROABS 4.7  --   --   --   --   HGB 12.2* 13.3 10.5* 10.0* 9.7*  HCT 37.8* 39.0 32.7* 31.2* 29.8*  MCV 95.7  --  94.8 95.7 94.3  PLT 256  --  227 208 221    Cardiac Enzymes:  Recent Labs Lab 08/05/16 0152 08/05/16 0717 08/06/16 0439  TROPONINI 0.21* 0.32* 0.19*    BNP: BNP (last 3 results) No results for input(s): BNP in the last 8760 hours.  ProBNP (last 3 results) No results for input(s): PROBNP in the last 8760 hours.    Other results:  Imaging: No results found.   Medications:     Scheduled Medications: . allopurinol  100 mg Oral Daily  . aspirin  81 mg Oral Daily  . cinacalcet  60 mg Oral BID WC  . citalopram  10 mg Oral Daily  . clopidogrel  75 mg Oral Q breakfast  . colesevelam  1,250 mg Oral BID WC  . ezetimibe  10 mg Oral Daily  . gabapentin  300-600 mg Oral QHS  . insulin aspart  0-9 Units Subcutaneous TID WC  . lanthanum  1,000 mg Oral BID WC  . multivitamin  1 tablet Oral QHS    Infusions:   PRN Medications: acetaminophen, hydrALAZINE, HYDROcodone-acetaminophen, nitroGLYCERIN, ondansetron (ZOFRAN) IV   Assessment:   1. NSTEMI 2. CAD s/p PCI RCA in 2008     --s/p PCI LAD today 3. ESRD 4. DM2 5. HTN 6. Chronic diastolic HF  Plan/Discussion:    S/p PCI of LAD. Looks good. Volume status stable. Home today  Continue Plavix 75 mg daily and ASA 81. Unable to tolerate statins or b-blocker. Stressed need to be completely compliant with Plavix.  Will need outpatient cardiac rehab  Length of Stay: 3   Bensimhon, Daniel MD 08/08/2016, 3:45 PM  Advanced Heart Failure Team Pager [phone] (M-F; [place])  Please contact [facility] for night-coverage after hours (4p -7a ) and weekends on [email or web address]

## 2016-08-08 NOTE — Progress Notes (Signed)
[name] Pt declined walking as he stated his knee is really bothering him. He stated he walks only as needed. He stated he had already walked to bathroom and that was enough. No CP. Brief ed done with pt who voiced understanding. Stressed importance of plavix with stent. Encouraged pt to address any diet questions with HD dietitian as he stated he meets with her as needed. Did not review NTG use as his BP is so low. Encouraged him to call 911 if needed for CP. Pt stated he would be interested in CRP 2 [place] after this year as he needed some time to recover and let his knee have some rest. Will send referral and have them contact in January at his request. Pt does not weigh daily but is weighed on dialysis days.  Graylon Good RN BSN 08/08/2016 9:56 AM

## 2016-08-09 ENCOUNTER — Telehealth: Payer: Self-pay | Admitting: *Deleted

## 2016-08-09 DIAGNOSIS — D631 Anemia in chronic kidney disease: Secondary | ICD-10-CM | POA: Diagnosis not present

## 2016-08-09 DIAGNOSIS — N2581 Secondary hyperparathyroidism of renal origin: Secondary | ICD-10-CM | POA: Diagnosis not present

## 2016-08-09 DIAGNOSIS — E119 Type 2 diabetes mellitus without complications: Secondary | ICD-10-CM | POA: Diagnosis not present

## 2016-08-09 DIAGNOSIS — N186 End stage renal disease: Secondary | ICD-10-CM | POA: Diagnosis not present

## 2016-08-09 NOTE — Telephone Encounter (Signed)
Tried calling pt to get TCM appt set=up spoke w/someone at the home pt/or wife is not there at this time. Left msg w/them to RTC...[name]

## 2016-08-10 NOTE — Telephone Encounter (Signed)
Transition Care Management Follow-up Telephone Call   Date discharged? 08/08/16   How have you been since you were released from the hospital? Spoke w/wife she states he is doing alright   Do you understand why you were in the hospital? YES   Do you understand the discharge instructions? YES   Where were you discharged to? Home   Items Reviewed:  Medications reviewed: YES  Allergies reviewed: YES  Dietary changes reviewed: YES  Referrals reviewed: No referral needed   Functional Questionnaire:   Activities of Daily Living (ADLs):   she states he are independent in the following: feeding, continence, grooming, toileting and dressing States he require assistance with the following: ambulation and bathing and hygiene   Any transportation issues/concerns?: NO   Any patient concerns? NO   Confirmed importance and date/time of follow-up visits scheduled YES wife had arteady made appt for 08/15/16  Provider Appointment booked with Dr. [name]  Confirmed with patient if condition begins to worsen call PCP or go to the ER.  Patient was given the office number and encouraged to call back with question or concerns.  : YES

## 2016-08-11 DIAGNOSIS — N186 End stage renal disease: Secondary | ICD-10-CM | POA: Diagnosis not present

## 2016-08-11 DIAGNOSIS — D631 Anemia in chronic kidney disease: Secondary | ICD-10-CM | POA: Diagnosis not present

## 2016-08-11 DIAGNOSIS — N2581 Secondary hyperparathyroidism of renal origin: Secondary | ICD-10-CM | POA: Diagnosis not present

## 2016-08-11 DIAGNOSIS — E119 Type 2 diabetes mellitus without complications: Secondary | ICD-10-CM | POA: Diagnosis not present

## 2016-08-14 DIAGNOSIS — E119 Type 2 diabetes mellitus without complications: Secondary | ICD-10-CM | POA: Diagnosis not present

## 2016-08-14 DIAGNOSIS — D631 Anemia in chronic kidney disease: Secondary | ICD-10-CM | POA: Diagnosis not present

## 2016-08-14 DIAGNOSIS — N2581 Secondary hyperparathyroidism of renal origin: Secondary | ICD-10-CM | POA: Diagnosis not present

## 2016-08-14 DIAGNOSIS — N186 End stage renal disease: Secondary | ICD-10-CM | POA: Diagnosis not present

## 2016-08-15 ENCOUNTER — Ambulatory Visit (INDEPENDENT_AMBULATORY_CARE_PROVIDER_SITE_OTHER): Payer: Medicare Other | Admitting: Internal Medicine

## 2016-08-15 ENCOUNTER — Encounter: Payer: Self-pay | Admitting: Internal Medicine

## 2016-08-15 VITALS — BP 104/72 | HR 99 | Temp 97.5°F | Ht 73.0 in | Wt 233.0 lb

## 2016-08-15 DIAGNOSIS — M1710 Unilateral primary osteoarthritis, unspecified knee: Secondary | ICD-10-CM | POA: Diagnosis not present

## 2016-08-15 DIAGNOSIS — N184 Chronic kidney disease, stage 4 (severe): Secondary | ICD-10-CM

## 2016-08-15 DIAGNOSIS — I959 Hypotension, unspecified: Secondary | ICD-10-CM

## 2016-08-15 DIAGNOSIS — N186 End stage renal disease: Secondary | ICD-10-CM

## 2016-08-15 DIAGNOSIS — K5900 Constipation, unspecified: Secondary | ICD-10-CM

## 2016-08-15 DIAGNOSIS — E1122 Type 2 diabetes mellitus with diabetic chronic kidney disease: Secondary | ICD-10-CM

## 2016-08-15 DIAGNOSIS — Z992 Dependence on renal dialysis: Secondary | ICD-10-CM

## 2016-08-15 MED ORDER — REPAGLINIDE 0.5 MG PO TABS: 0.5000 mg | ORAL_TABLET | Freq: Every day | ORAL | 11 refills | Status: DC

## 2016-08-15 NOTE — Progress Notes (Signed)
Pre visit review using our clinic review tool, if applicable. No additional management support is needed unless otherwise documented below in the visit note. 

## 2016-08-15 NOTE — Patient Instructions (Addendum)
Please ask Dr Jeffie Pollock if you can take the midodrine every day  Please take all new medication as recommended : restarting the miralax at home  Please continue all other medications as before, and refills have been done if requested.  Please have the pharmacy call with any other refills you may need.  Please keep your appointments with your specialists as you may have planned  Please return in 6 months, or sooner if needed

## 2016-08-15 NOTE — Progress Notes (Signed)
Subjective:    Patient ID: [name], male    DOB: 07-Nov-1950, 66 y.o.   MRN: [id number]  HPI  Here to f/u with history of combined systolic diastolic CHF, ESRD on HD T/Th/Sat, HTN, DM, and CAD with DES related stent after NSTEMI, also with recurrent problems with hypotension, more so on dialysis days. Pt states feels generally weak, mild unsteady with walking with walker, constipated,and lower BP again noted today on intake vitals. Does feel worse on dialysis days but no HD today.  Now off all Anti HTN meds, and midrodrine added but only for HD days and sbp < 90 per cardiology.  Pt denies chest pain, wheezing, orthopnea, PND, increased LE swelling, palpitations, or falls.  Has been somewhat confused about his antiDM meds, by chart it appears pt was supposed to start prandin after last visit with Endo, but wife (who is normally supportive and capable) states she was unaware and was not called by [facility] as they usually do when a medication is ready for pickup.  CBG's have been in 200's on occasion, sometimes lower, but no low sugars.  Also has been scheduled to go to ortho speciality office inf [place], [place]  (assoc with [facility]); no longer eligible for surgury for 12 mo due to recent stent now on plavix, but still plans to f/u due to ongoing pain.   Pt denies fever, unusual wt loss, night sweats.  Has been constipated, has not been taking the miralax in last few weeks. Denies worsening reflux, other abd pain, dysphagia, or blood but did have one day after hosp d/c with bloating and nausea which passed by next day. Past Medical History:  Diagnosis Date  . Allergic rhinitis, cause unspecified 02/24/2014  . Anemia 06/16/2011  . BENIGN PROSTATIC HYPERTROPHY 10/14/2009  . CAD, NATIVE VESSEL 02/06/2009   saw Dr. Missy Sabins last jan 2013  . Cervical radiculopathy, chronic 02/23/2016   Right c5-6 by NCS/EMG  . CHEST PAIN 03/29/2010  . COLONIC POLYPS, HX OF 10/14/2009  . CONGESTIVE HEART FAILURE  06/18/2007  . Dementia [id number]  . DEPRESSION 10/14/2009  . Depression 09/24/2015  . DIABETES MELLITUS, TYPE II 02/01/2010  . DIASTOLIC HEART FAILURE, CHRONIC 02/06/2009  . DIZZINESS 07/17/2010  . DYSLIPIDEMIA 06/18/2007  . DYSPNEA 10/29/2008  . ESRD (end stage renal disease) on dialysis ([place]) 08/04/2010   "TTS;  " (04/18/2015)  . FOOT PAIN 08/12/2008  . GAIT DISTURBANCE 03/03/2010  . GASTROENTERITIS, VIRAL 10/14/2009  . GERD 06/18/2007  . GOITER, MULTINODULAR 12/26/2007  . GOUT 06/18/2007  . GYNECOMASTIA 07/17/2010  . Hemodialysis access, fistula [facility])    Dialysis T-Th-Sa ([place]) Right upper arm fistula  . Hyperlipidemia 10/16/2011  . Hyperparathyroidism, secondary ([place]) 06/16/2011  . HYPERTENSION 06/18/2007  . Hyperthyroidism   . HYPERTHYROIDISM 02/02/2010  . Hypocalcemia 06/07/2010  . Ischemic cardiomyopathy 06/16/2011  . NECK PAIN 07/31/2010  . ONYCHOMYCOSIS, TOENAILS 12/26/2007  . OSA on CPAP 10/16/2011  . Other malaise and fatigue 11/24/2009  . PERIPHERAL NEUROPATHY 06/18/2007  . Prostate cancer ([place])   . PULMONARY NODULE, RIGHT LOWER LOBE 06/08/2009  . Renal insufficiency   . Sleep apnea    cpap machine and o2  . TRANSAMINASES, SERUM, ELEVATED 02/01/2010  . Transfusion history    none recent  . Unspecified hypotension 01/30/2010   Past Surgical History:  Procedure Laterality Date  . ARTERIOVENOUS GRAFT PLACEMENT Right 2009   forearm/notes 02/01/2011  . AV FISTULA PLACEMENT  11/07/2011   Procedure:  INSERTION OF ARTERIOVENOUS (AV) GORE-TEX GRAFT ARM;  Surgeon: Tinnie Gens, MD;  Location: [place];  Service: Vascular;  Laterality: Left;  . BACK SURGERY  1998  . BASCILIC VEIN TRANSPOSITION Right 02/27/2013   Procedure: BASCILIC VEIN TRANSPOSITION;  Surgeon: Mal Misty, MD;  Location: [place];  Service: Vascular;  Laterality: Right;  Right Basilic Vein Transposition   . CARDIAC CATHETERIZATION N/A 08/06/2016   Procedure: Left Heart Cath and Coronary Angiography;  Surgeon:  Jolaine Artist, MD;  Location: [place] CV LAB;  Service: Cardiovascular;  Laterality: N/A;  . CARDIAC CATHETERIZATION N/A 08/07/2016   Procedure: Coronary/Graft Atherectomy-CSI LAD;  Surgeon: Peter M Martinique, MD;  Location: [place] CV LAB;  Service: Cardiovascular;  Laterality: N/A;  . CERVICAL SPINE SURGERY  2/09   "to repair nerve problems in my left arm"  . CHOLECYSTECTOMY    . CORONARY ANGIOPLASTY WITH STENT PLACEMENT  06/11/2008  . CORONARY ANGIOPLASTY WITH STENT PLACEMENT  06/2007   TAXUS stent to RCA/notes 01/31/2011  . ESOPHAGOGASTRODUODENOSCOPY  09/28/2011   Procedure: ESOPHAGOGASTRODUODENOSCOPY (EGD);  Surgeon: Missy Sabins, MD;  Location: [facility] ENDOSCOPY;  Service: Endoscopy;  Laterality: N/A;  . ESOPHAGOGASTRODUODENOSCOPY N/A 04/07/2015   Procedure: ESOPHAGOGASTRODUODENOSCOPY (EGD);  Surgeon: Teena Irani, MD;  Location: [facility];  Service: Endoscopy;  Laterality: N/A;  . ESOPHAGOGASTRODUODENOSCOPY N/A 04/19/2015   Procedure: ESOPHAGOGASTRODUODENOSCOPY (EGD);  Surgeon: Arta Silence, MD;  Location: [facility] ENDOSCOPY;  Service: Endoscopy;  Laterality: N/A;  . FOREIGN BODY REMOVAL  09/2003   via upper endoscopy/notes 02/12/2011  . GIVENS CAPSULE STUDY  09/30/2011   Procedure: GIVENS CAPSULE STUDY;  Surgeon: Jeryl Columbia, MD;  Location: [facility] ENDOSCOPY;  Service: Endoscopy;  Laterality: N/A;  . INSERTION OF DIALYSIS CATHETER Right 2014  . INSERTION OF DIALYSIS CATHETER Left 02/11/2013   Procedure: INSERTION OF DIALYSIS CATHETER;  Surgeon: Conrad Ponderosa, MD;  Location: [place];  Service: Vascular;  Laterality: Left;  Ultrasound guided  . REMOVAL OF A DIALYSIS CATHETER Right 02/11/2013   Procedure: REMOVAL OF A DIALYSIS CATHETER;  Surgeon: Conrad [place], MD;  Location: [facility];  Service: Vascular;  Laterality: Right;  . SAVORY DILATION N/A 04/07/2015   Procedure: SAVORY DILATION;  Surgeon: Teena Irani, MD;  Location: [facility] ENDOSCOPY;  Service: Endoscopy;  Laterality: N/A;  . SHUNTOGRAM N/A 09/20/2011    Procedure: Earney Mallet;  Surgeon: Conrad WaKeeney, MD;  Location: [facility] CATH LAB;  Service: Cardiovascular;  Laterality: N/A;  . TONSILLECTOMY    . TOTAL KNEE ARTHROPLASTY Right 08/02/2015   Procedure: TOTAL KNEE ARTHROPLASTY;  Surgeon: Renette Butters, MD;  Location: [place];  Service: Orthopedics;  Laterality: Right;  . VENOGRAM N/A 01/26/2013   Procedure: VENOGRAM;  Surgeon: Angelia Mould, MD;  Location: [facility] CATH LAB;  Service: Cardiovascular;  Laterality: N/A;    reports that he quit smoking about 10 years ago. His smoking use included Cigarettes. He has a 25.00 pack-year smoking history. He quit smokeless tobacco use about 10 years ago. He reports that he does not drink alcohol or use drugs. family history includes Diabetes in his father; Heart disease in his father and sister; Hypertension in his father; Kidney failure in his father. Allergies  Allergen Reactions  . Cephalexin Swelling and Other (See Comments)    Tongue swelling  . Statins Other (See Comments)    Weak muscles  . Ciprofloxacin Rash   Current Outpatient Prescriptions on File Prior to Visit  Medication Sig Dispense Refill  . allopurinol (ZYLOPRIM) 100 MG  tablet Take 1 tablet (100 mg total) by mouth daily. 90 tablet 3  . aspirin 81 MG tablet Take 81 mg by mouth daily.    . cinacalcet (SENSIPAR) 60 MG tablet Take 60 mg by mouth 2 (two) times daily.     . citalopram (CELEXA) 10 MG tablet Take 1 tablet (10 mg total) by mouth daily. 90 tablet 3  . clopidogrel (PLAVIX) 75 MG tablet Take 1 tablet (75 mg total) by mouth daily with breakfast. 30 tablet 0  . colesevelam (WELCHOL) 625 MG tablet TAKE THREE TABLETS BY MOUTH TWICE DAILY WITH MEALS (Patient taking differently: Take 1,250 mg by mouth 2 (two) times daily with a meal. TAKE THREE TABLETS BY MOUTH TWICE DAILY WITH MEALS) 180 tablet 11  . ezetimibe (ZETIA) 10 MG tablet Take 1 tablet (10 mg total) by mouth daily. 90 tablet 3  . gabapentin (NEURONTIN) 300 MG capsule 1-2 tabs by  mouth at bedtime for sleep and pain (Patient taking differently: Take 600 mg by mouth at bedtime. 1-2 tabs by mouth at bedtime for sleep and pain) 180 capsule 3  . lanthanum (FOSRENOL) 1000 MG chewable tablet Chew 1,000 mg by mouth 2 (two) times daily with a meal. Reported on 12/23/2015    . midodrine (PROAMATINE) 2.5 MG tablet Take 1 tablet (2.5 mg total) by mouth 2 (two) times daily with a meal. ONLY ON HD DAY OR FOR BP LESS THAN 90 15 tablet 3  . multivitamin (RENA-VIT) TABS tablet Take 1 tablet by mouth daily. Reported on 10/24/2015     No current facility-administered medications on file prior to visit.     Review of Systems  Constitutional: Negative for unusual diaphoresis or night sweats HENT: Negative for ear swelling or discharge Eyes: Negative for worsening visual haziness  Respiratory: Negative for choking and stridor.   Gastrointestinal: Negative for distension or worsening eructation Genitourinary: Negative for retention or change in urine volume.  Musculoskeletal: Negative for other MSK pain or swelling Skin: Negative for color change and worsening wound Neurological: Negative for tremors and numbness other than noted  Psychiatric/Behavioral: Negative for decreased concentration or agitation other than above   All other system neg per pt    Objective:   Physical Exam  BP 104/72   Pulse 99   Temp 97.5 F (36.4 C) (Oral)   Ht 6\' 1"  (1.854 m)   Wt 233 lb (105.7 kg)   SpO2 95%   BMI 30.74 kg/m  VS noted, obese, fatigued appearing, walks mild unsteady with walker Constitutional: Pt appears in no apparent distress HENT: Head: NCAT.  Right Ear: External ear normal.  Left Ear: External ear normal.  Eyes: . Pupils are equal, round, and reactive to light. Conjunctivae and EOM are normal Neck: Normal range of motion. Neck supple.  Cardiovascular: Normal rate and regular rhythm.   Pulmonary/Chest: Effort normal and breath sounds without rales or wheezing.  Abd:  Soft, NT, ND,  + BS Neurological: Pt is alert. Not confused , motor grossly intact Skin: Skin is warm. No rash, trace bilat LE edema Psychiatric: Pt behavior is normal. No agitation.  No other new exam findings    Assessment & Plan:

## 2016-08-16 DIAGNOSIS — D631 Anemia in chronic kidney disease: Secondary | ICD-10-CM | POA: Diagnosis not present

## 2016-08-16 DIAGNOSIS — N186 End stage renal disease: Secondary | ICD-10-CM | POA: Diagnosis not present

## 2016-08-16 DIAGNOSIS — N2581 Secondary hyperparathyroidism of renal origin: Secondary | ICD-10-CM | POA: Diagnosis not present

## 2016-08-16 DIAGNOSIS — R198 Other specified symptoms and signs involving the digestive system and abdomen: Secondary | ICD-10-CM | POA: Insufficient documentation

## 2016-08-16 DIAGNOSIS — E119 Type 2 diabetes mellitus without complications: Secondary | ICD-10-CM | POA: Diagnosis not present

## 2016-08-16 NOTE — Assessment & Plan Note (Signed)
Low normal today, no falls, asked pt to f/u with cardiology regarding possibly taking midodrine daily instead of just HD days or SBP < 90

## 2016-08-16 NOTE — Assessment & Plan Note (Addendum)
Needs surgury to left (s/p right TKA already) but unable now due to unable to stop plavix post stent for 12 mo, to f/u ortho, consider pain management referral

## 2016-08-16 NOTE — Assessment & Plan Note (Signed)
Mild, for restart miralax daily asd,  to f/u any worsening symptoms or concerns

## 2016-08-16 NOTE — Assessment & Plan Note (Signed)
D/w wife and pt, ok to start prandin as intended per Endo, with f/u endo as planned

## 2016-08-18 DIAGNOSIS — E119 Type 2 diabetes mellitus without complications: Secondary | ICD-10-CM | POA: Diagnosis not present

## 2016-08-18 DIAGNOSIS — D631 Anemia in chronic kidney disease: Secondary | ICD-10-CM | POA: Diagnosis not present

## 2016-08-18 DIAGNOSIS — N186 End stage renal disease: Secondary | ICD-10-CM | POA: Diagnosis not present

## 2016-08-18 DIAGNOSIS — N2581 Secondary hyperparathyroidism of renal origin: Secondary | ICD-10-CM | POA: Diagnosis not present

## 2016-08-20 DIAGNOSIS — E119 Type 2 diabetes mellitus without complications: Secondary | ICD-10-CM | POA: Diagnosis not present

## 2016-08-20 DIAGNOSIS — N2581 Secondary hyperparathyroidism of renal origin: Secondary | ICD-10-CM | POA: Diagnosis not present

## 2016-08-20 DIAGNOSIS — D631 Anemia in chronic kidney disease: Secondary | ICD-10-CM | POA: Diagnosis not present

## 2016-08-20 DIAGNOSIS — N186 End stage renal disease: Secondary | ICD-10-CM | POA: Diagnosis not present

## 2016-08-22 DIAGNOSIS — N2581 Secondary hyperparathyroidism of renal origin: Secondary | ICD-10-CM | POA: Diagnosis not present

## 2016-08-22 DIAGNOSIS — E119 Type 2 diabetes mellitus without complications: Secondary | ICD-10-CM | POA: Diagnosis not present

## 2016-08-22 DIAGNOSIS — D631 Anemia in chronic kidney disease: Secondary | ICD-10-CM | POA: Diagnosis not present

## 2016-08-22 DIAGNOSIS — N186 End stage renal disease: Secondary | ICD-10-CM | POA: Diagnosis not present

## 2016-08-25 DIAGNOSIS — E119 Type 2 diabetes mellitus without complications: Secondary | ICD-10-CM | POA: Diagnosis not present

## 2016-08-25 DIAGNOSIS — N186 End stage renal disease: Secondary | ICD-10-CM | POA: Diagnosis not present

## 2016-08-25 DIAGNOSIS — D631 Anemia in chronic kidney disease: Secondary | ICD-10-CM | POA: Diagnosis not present

## 2016-08-25 DIAGNOSIS — N2581 Secondary hyperparathyroidism of renal origin: Secondary | ICD-10-CM | POA: Diagnosis not present

## 2016-08-27 ENCOUNTER — Other Ambulatory Visit (HOSPITAL_COMMUNITY): Payer: Self-pay | Admitting: Gastroenterology

## 2016-08-27 ENCOUNTER — Encounter: Payer: Self-pay | Admitting: *Deleted

## 2016-08-27 DIAGNOSIS — R6881 Early satiety: Secondary | ICD-10-CM | POA: Insufficient documentation

## 2016-08-27 DIAGNOSIS — K921 Melena: Secondary | ICD-10-CM | POA: Insufficient documentation

## 2016-08-27 DIAGNOSIS — R198 Other specified symptoms and signs involving the digestive system and abdomen: Secondary | ICD-10-CM

## 2016-08-27 DIAGNOSIS — K2210 Ulcer of esophagus without bleeding: Secondary | ICD-10-CM | POA: Insufficient documentation

## 2016-08-27 DIAGNOSIS — R142 Eructation: Secondary | ICD-10-CM | POA: Insufficient documentation

## 2016-08-27 DIAGNOSIS — R141 Gas pain: Secondary | ICD-10-CM | POA: Insufficient documentation

## 2016-08-27 DIAGNOSIS — R140 Abdominal distension (gaseous): Secondary | ICD-10-CM | POA: Diagnosis not present

## 2016-08-27 DIAGNOSIS — R109 Unspecified abdominal pain: Secondary | ICD-10-CM | POA: Insufficient documentation

## 2016-08-27 DIAGNOSIS — R112 Nausea with vomiting, unspecified: Secondary | ICD-10-CM

## 2016-08-27 DIAGNOSIS — R531 Weakness: Secondary | ICD-10-CM | POA: Insufficient documentation

## 2016-08-27 DIAGNOSIS — K222 Esophageal obstruction: Secondary | ICD-10-CM | POA: Insufficient documentation

## 2016-08-27 DIAGNOSIS — R143 Flatulence: Secondary | ICD-10-CM

## 2016-08-27 DIAGNOSIS — R209 Unspecified disturbances of skin sensation: Secondary | ICD-10-CM | POA: Insufficient documentation

## 2016-08-27 DIAGNOSIS — R932 Abnormal findings on diagnostic imaging of liver and biliary tract: Secondary | ICD-10-CM | POA: Insufficient documentation

## 2016-08-27 DIAGNOSIS — K219 Gastro-esophageal reflux disease without esophagitis: Secondary | ICD-10-CM | POA: Diagnosis not present

## 2016-08-28 DIAGNOSIS — N2581 Secondary hyperparathyroidism of renal origin: Secondary | ICD-10-CM | POA: Diagnosis not present

## 2016-08-28 DIAGNOSIS — N186 End stage renal disease: Secondary | ICD-10-CM | POA: Diagnosis not present

## 2016-08-28 DIAGNOSIS — D631 Anemia in chronic kidney disease: Secondary | ICD-10-CM | POA: Diagnosis not present

## 2016-08-28 DIAGNOSIS — E119 Type 2 diabetes mellitus without complications: Secondary | ICD-10-CM | POA: Diagnosis not present

## 2016-08-29 ENCOUNTER — Encounter: Payer: Self-pay | Admitting: Cardiovascular Disease

## 2016-08-29 ENCOUNTER — Ambulatory Visit (INDEPENDENT_AMBULATORY_CARE_PROVIDER_SITE_OTHER): Payer: Medicare Other | Admitting: Cardiovascular Disease

## 2016-08-29 VITALS — BP 134/74 | HR 106 | Ht 73.0 in | Wt 232.0 lb

## 2016-08-29 DIAGNOSIS — I739 Peripheral vascular disease, unspecified: Secondary | ICD-10-CM

## 2016-08-29 DIAGNOSIS — I10 Essential (primary) hypertension: Secondary | ICD-10-CM | POA: Diagnosis not present

## 2016-08-29 DIAGNOSIS — I2583 Coronary atherosclerosis due to lipid rich plaque: Secondary | ICD-10-CM | POA: Diagnosis not present

## 2016-08-29 DIAGNOSIS — I2510 Atherosclerotic heart disease of native coronary artery without angina pectoris: Secondary | ICD-10-CM | POA: Diagnosis not present

## 2016-08-29 NOTE — Patient Instructions (Addendum)
Medication Changes:  Your physician recommends that you continue on your current medications as directed. Please refer to the Current Medication list given to you today.   Follow-Up: Your physician recommends that you schedule a follow-up appointment as needed with Dr. Gwenlyn Found.

## 2016-08-29 NOTE — Progress Notes (Signed)
08/29/2016 [name]   [birth year]  [id number]  Primary Physician [name] Cower, MD Primary Cardiologist: [name] Harp MD [name]  HPI:  [name] is a 65 year old moderately overweight married African-American male father of 3 children accompanied by his wife [name] today. He is referred by [name], his endocrinologist for peripheral vascular evaluation because of numbness in his legs and abnormal Doppler studies. His cardiologist is Dr. Haroldine [name]. He does have LV dysfunction and recently was admitted with a non-STEMI and underwent diamondback orbital rotation arthrectomy, PCI and drug-eluting stenting of his LAD by Dr. Martinique. His other problems include hypertension, hyperlipidemia and diabetes. He has a 20-pack-year history of tobacco abuse having quit 10 years ago.   Current Outpatient Prescriptions  Medication Sig Dispense Refill  . allopurinol (ZYLOPRIM) 100 MG tablet Take 1 tablet (100 mg total) by mouth daily. 90 tablet 3  . aspirin 81 MG tablet Take 81 mg by mouth daily.    . cinacalcet (SENSIPAR) 60 MG tablet Take 60 mg by mouth 2 (two) times daily.     . citalopram (CELEXA) 10 MG tablet Take 1 tablet (10 mg total) by mouth daily. 90 tablet 3  . clopidogrel (PLAVIX) 75 MG tablet Take 1 tablet (75 mg total) by mouth daily with breakfast. 30 tablet 0  . colesevelam (WELCHOL) 625 MG tablet TAKE THREE TABLETS BY MOUTH TWICE DAILY WITH MEALS (Patient taking differently: Take 1,250 mg by mouth 2 (two) times daily with a meal. TAKE THREE TABLETS BY MOUTH TWICE DAILY WITH MEALS) 180 tablet 11  . ezetimibe (ZETIA) 10 MG tablet Take 1 tablet (10 mg total) by mouth daily. 90 tablet 3  . gabapentin (NEURONTIN) 300 MG capsule 1-2 tabs by mouth at bedtime for sleep and pain (Patient taking differently: Take 600 mg by mouth at bedtime. 1-2 tabs by mouth at bedtime for sleep and pain) 180 capsule 3  . lanthanum (FOSRENOL) 1000 MG chewable tablet Chew 1,000 mg by mouth 2  (two) times daily with a meal. Reported on 12/23/2015    . midodrine (PROAMATINE) 2.5 MG tablet Take 1 tablet (2.5 mg total) by mouth 2 (two) times daily with a meal. ONLY ON HD DAY OR FOR BP LESS THAN 90 15 tablet 3  . multivitamin (RENA-VIT) TABS tablet Take 1 tablet by mouth daily. Reported on 10/24/2015    . repaglinide (PRANDIN) 0.5 MG tablet Take 1 tablet (0.5 mg total) by mouth daily with breakfast. 30 tablet 11   No current facility-administered medications for this visit.     Allergies  Allergen Reactions  . Cephalexin Swelling and Other (See Comments)    Tongue swelling  . Statins Other (See Comments)    Weak muscles  . Ciprofloxacin Rash    Social History   Social History  . Marital status: Married    Spouse name: N/A  . Number of children: 3  . Years of education: N/A   Occupational History  . 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    Social History Main Topics  . Smoking status: Former Smoker    Packs/day: 1.00    Years: 25.00    Types: Cigarettes    Quit date: 10/01/2005  . Smokeless tobacco: Former Systems developer    Quit date: 10/01/2005     Comment: Quit smoking 2007 Smoked x 25 years 1/2 ppd.  . Alcohol use No  . Drug use: No  . Sexual activity: No   Other  Topics Concern  . Not on file   Social History Narrative  . No narrative on file     Review of Systems: General: negative for chills, fever, night sweats or weight changes.  Cardiovascular: negative for chest pain, dyspnea on exertion, edema, orthopnea, palpitations, paroxysmal nocturnal dyspnea or shortness of breath Dermatological: negative for rash Respiratory: negative for cough or wheezing Urologic: negative for hematuria Abdominal: negative for nausea, vomiting, diarrhea, bright red blood per rectum, melena, or hematemesis Neurologic: negative for visual changes, syncope, or dizziness All other systems reviewed and are otherwise negative except as noted above.    Blood  pressure 134/74, pulse (!) 106, height 6\' 1"  (1.854 m), weight 232 lb (105.2 kg).  General appearance: alert and no distress Neck: no adenopathy, no carotid bruit, no JVD, supple, symmetrical, trachea midline and thyroid not enlarged, symmetric, no tenderness/mass/nodules Lungs: clear to auscultation bilaterally Heart: regular rate and rhythm, S1, S2 normal, no murmur, click, rub or gallop Extremities: extremities normal, atraumatic, no cyanosis or edema  EKG sinus tachycardia at 106 with nonspecific ST and T-wave changes. I personally reviewed this EKG  ASSESSMENT AND PLAN:   PAD (peripheral artery disease) ([place]) [name] was referred by [name] for evaluation of peripheral arterial disease. He has a history of coronary artery disease, remote tobacco abuse, hypertension, diabetes and hyperlipidemia. He denies claudication. There is no evidence of skin breakdown. He did have Dopplers performed on 07/09/16 revealing ABIs of 0.7 bilaterally with tibial vessel disease. Based on his lack of symptoms and the fact that he does not have critical limb ischemia I do not think he needs any more aggressive evaluation or treatment. He does have diabetic for full neuropathy symptoms however.      [name] Harp MD FACP,FACC,FAHA, [facility] 08/29/2016 2:36 PM

## 2016-08-29 NOTE — Assessment & Plan Note (Signed)
[name] was referred by Dr. Loanne Drilling for evaluation of peripheral arterial disease. He has a history of coronary artery disease, remote tobacco abuse, hypertension, diabetes and hyperlipidemia. He denies claudication. There is no evidence of skin breakdown. He did have Dopplers performed on 07/09/16 revealing ABIs of 0.7 bilaterally with tibial vessel disease. Based on his lack of symptoms and the fact that he does not have critical limb ischemia I do not think he needs any more aggressive evaluation or treatment. He does have diabetic for full neuropathy symptoms however.

## 2016-08-30 DIAGNOSIS — N2581 Secondary hyperparathyroidism of renal origin: Secondary | ICD-10-CM | POA: Diagnosis not present

## 2016-08-30 DIAGNOSIS — Z992 Dependence on renal dialysis: Secondary | ICD-10-CM | POA: Diagnosis not present

## 2016-08-30 DIAGNOSIS — E119 Type 2 diabetes mellitus without complications: Secondary | ICD-10-CM | POA: Diagnosis not present

## 2016-08-30 DIAGNOSIS — E1129 Type 2 diabetes mellitus with other diabetic kidney complication: Secondary | ICD-10-CM | POA: Diagnosis not present

## 2016-08-30 DIAGNOSIS — D631 Anemia in chronic kidney disease: Secondary | ICD-10-CM | POA: Diagnosis not present

## 2016-08-30 DIAGNOSIS — N186 End stage renal disease: Secondary | ICD-10-CM | POA: Diagnosis not present

## 2016-08-31 ENCOUNTER — Ambulatory Visit (HOSPITAL_COMMUNITY)
Admission: RE | Admit: 2016-08-31 | Discharge: 2016-08-31 | Disposition: A | Discharge status: Home / Self Care | Payer: Medicare Other | Source: Ambulatory Visit | Attending: Gastroenterology | Admitting: Gastroenterology

## 2016-08-31 DIAGNOSIS — M5136 Other intervertebral disc degeneration, lumbar region: Secondary | ICD-10-CM | POA: Diagnosis not present

## 2016-08-31 DIAGNOSIS — Z9889 Other specified postprocedural states: Secondary | ICD-10-CM | POA: Insufficient documentation

## 2016-08-31 DIAGNOSIS — R1013 Epigastric pain: Secondary | ICD-10-CM | POA: Diagnosis not present

## 2016-08-31 DIAGNOSIS — R198 Other specified symptoms and signs involving the digestive system and abdomen: Secondary | ICD-10-CM | POA: Diagnosis not present

## 2016-08-31 DIAGNOSIS — R112 Nausea with vomiting, unspecified: Secondary | ICD-10-CM | POA: Diagnosis not present

## 2016-09-01 DIAGNOSIS — D631 Anemia in chronic kidney disease: Secondary | ICD-10-CM | POA: Diagnosis not present

## 2016-09-01 DIAGNOSIS — N2581 Secondary hyperparathyroidism of renal origin: Secondary | ICD-10-CM | POA: Diagnosis not present

## 2016-09-01 DIAGNOSIS — N186 End stage renal disease: Secondary | ICD-10-CM | POA: Diagnosis not present

## 2016-09-01 DIAGNOSIS — E119 Type 2 diabetes mellitus without complications: Secondary | ICD-10-CM | POA: Diagnosis not present

## 2016-09-04 DIAGNOSIS — N186 End stage renal disease: Secondary | ICD-10-CM | POA: Diagnosis not present

## 2016-09-04 DIAGNOSIS — D631 Anemia in chronic kidney disease: Secondary | ICD-10-CM | POA: Diagnosis not present

## 2016-09-04 DIAGNOSIS — E119 Type 2 diabetes mellitus without complications: Secondary | ICD-10-CM | POA: Diagnosis not present

## 2016-09-04 DIAGNOSIS — N2581 Secondary hyperparathyroidism of renal origin: Secondary | ICD-10-CM | POA: Diagnosis not present

## 2016-09-05 ENCOUNTER — Telehealth: Payer: Self-pay | Admitting: Internal Medicine

## 2016-09-05 ENCOUNTER — Other Ambulatory Visit (HOSPITAL_COMMUNITY): Payer: Self-pay

## 2016-09-05 MED ORDER — CLOPIDOGREL BISULFATE 75 MG PO TABS: 75.0000 mg | ORAL_TABLET | Freq: Every day | ORAL | 6 refills | Status: DC

## 2016-09-05 NOTE — Telephone Encounter (Signed)
plavix rx Has already been done today per cardiology

## 2016-09-05 NOTE — Telephone Encounter (Addendum)
States patient was placed on plavix in the hospital.  States patient did see Dr. [name] for a hospital fu.  Wife is requesting Dr. [name] to fill.  Patient uses [facility] on [address]. States patients last pill will be taken today.  Please follow up in regard.

## 2016-09-06 ENCOUNTER — Other Ambulatory Visit (HOSPITAL_COMMUNITY): Payer: Self-pay | Admitting: *Deleted

## 2016-09-06 DIAGNOSIS — N186 End stage renal disease: Secondary | ICD-10-CM | POA: Diagnosis not present

## 2016-09-06 DIAGNOSIS — N2581 Secondary hyperparathyroidism of renal origin: Secondary | ICD-10-CM | POA: Diagnosis not present

## 2016-09-06 DIAGNOSIS — D631 Anemia in chronic kidney disease: Secondary | ICD-10-CM | POA: Diagnosis not present

## 2016-09-06 DIAGNOSIS — E119 Type 2 diabetes mellitus without complications: Secondary | ICD-10-CM | POA: Diagnosis not present

## 2016-09-08 DIAGNOSIS — E119 Type 2 diabetes mellitus without complications: Secondary | ICD-10-CM | POA: Diagnosis not present

## 2016-09-08 DIAGNOSIS — N186 End stage renal disease: Secondary | ICD-10-CM | POA: Diagnosis not present

## 2016-09-08 DIAGNOSIS — N2581 Secondary hyperparathyroidism of renal origin: Secondary | ICD-10-CM | POA: Diagnosis not present

## 2016-09-08 DIAGNOSIS — D631 Anemia in chronic kidney disease: Secondary | ICD-10-CM | POA: Diagnosis not present

## 2016-09-11 DIAGNOSIS — D631 Anemia in chronic kidney disease: Secondary | ICD-10-CM | POA: Diagnosis not present

## 2016-09-11 DIAGNOSIS — N186 End stage renal disease: Secondary | ICD-10-CM | POA: Diagnosis not present

## 2016-09-11 DIAGNOSIS — N2581 Secondary hyperparathyroidism of renal origin: Secondary | ICD-10-CM | POA: Diagnosis not present

## 2016-09-11 DIAGNOSIS — E119 Type 2 diabetes mellitus without complications: Secondary | ICD-10-CM | POA: Diagnosis not present

## 2016-09-13 DIAGNOSIS — N186 End stage renal disease: Secondary | ICD-10-CM | POA: Diagnosis not present

## 2016-09-13 DIAGNOSIS — D631 Anemia in chronic kidney disease: Secondary | ICD-10-CM | POA: Diagnosis not present

## 2016-09-13 DIAGNOSIS — E119 Type 2 diabetes mellitus without complications: Secondary | ICD-10-CM | POA: Diagnosis not present

## 2016-09-13 DIAGNOSIS — N2581 Secondary hyperparathyroidism of renal origin: Secondary | ICD-10-CM | POA: Diagnosis not present

## 2016-09-15 DIAGNOSIS — N2581 Secondary hyperparathyroidism of renal origin: Secondary | ICD-10-CM | POA: Diagnosis not present

## 2016-09-15 DIAGNOSIS — D631 Anemia in chronic kidney disease: Secondary | ICD-10-CM | POA: Diagnosis not present

## 2016-09-15 DIAGNOSIS — N186 End stage renal disease: Secondary | ICD-10-CM | POA: Diagnosis not present

## 2016-09-15 DIAGNOSIS — E119 Type 2 diabetes mellitus without complications: Secondary | ICD-10-CM | POA: Diagnosis not present

## 2016-09-18 ENCOUNTER — Encounter (HOSPITAL_COMMUNITY): Payer: Self-pay | Admitting: Emergency Medicine

## 2016-09-18 ENCOUNTER — Emergency Department (HOSPITAL_COMMUNITY)
Admission: EM | Admit: 2016-09-18 | Discharge: 2016-09-19 | Disposition: A | Discharge status: Home / Self Care | Payer: Medicare Other | Attending: Emergency Medicine | Admitting: Emergency Medicine

## 2016-09-18 DIAGNOSIS — I132 Hypertensive heart and chronic kidney disease with heart failure and with stage 5 chronic kidney disease, or end stage renal disease: Secondary | ICD-10-CM | POA: Insufficient documentation

## 2016-09-18 DIAGNOSIS — R52 Pain, unspecified: Secondary | ICD-10-CM

## 2016-09-18 DIAGNOSIS — Z955 Presence of coronary angioplasty implant and graft: Secondary | ICD-10-CM | POA: Diagnosis not present

## 2016-09-18 DIAGNOSIS — Z96651 Presence of right artificial knee joint: Secondary | ICD-10-CM | POA: Diagnosis not present

## 2016-09-18 DIAGNOSIS — Z87891 Personal history of nicotine dependence: Secondary | ICD-10-CM | POA: Insufficient documentation

## 2016-09-18 DIAGNOSIS — Z7984 Long term (current) use of oral hypoglycemic drugs: Secondary | ICD-10-CM | POA: Insufficient documentation

## 2016-09-18 DIAGNOSIS — Z79899 Other long term (current) drug therapy: Secondary | ICD-10-CM | POA: Insufficient documentation

## 2016-09-18 DIAGNOSIS — I252 Old myocardial infarction: Secondary | ICD-10-CM | POA: Insufficient documentation

## 2016-09-18 DIAGNOSIS — E1140 Type 2 diabetes mellitus with diabetic neuropathy, unspecified: Secondary | ICD-10-CM | POA: Insufficient documentation

## 2016-09-18 DIAGNOSIS — E119 Type 2 diabetes mellitus without complications: Secondary | ICD-10-CM | POA: Diagnosis not present

## 2016-09-18 DIAGNOSIS — E1122 Type 2 diabetes mellitus with diabetic chronic kidney disease: Secondary | ICD-10-CM | POA: Insufficient documentation

## 2016-09-18 DIAGNOSIS — Z7902 Long term (current) use of antithrombotics/antiplatelets: Secondary | ICD-10-CM | POA: Diagnosis not present

## 2016-09-18 DIAGNOSIS — Z7982 Long term (current) use of aspirin: Secondary | ICD-10-CM | POA: Insufficient documentation

## 2016-09-18 DIAGNOSIS — Z992 Dependence on renal dialysis: Secondary | ICD-10-CM | POA: Insufficient documentation

## 2016-09-18 DIAGNOSIS — M791 Myalgia: Secondary | ICD-10-CM | POA: Diagnosis not present

## 2016-09-18 DIAGNOSIS — N2581 Secondary hyperparathyroidism of renal origin: Secondary | ICD-10-CM | POA: Diagnosis not present

## 2016-09-18 DIAGNOSIS — D631 Anemia in chronic kidney disease: Secondary | ICD-10-CM | POA: Diagnosis not present

## 2016-09-18 DIAGNOSIS — I2510 Atherosclerotic heart disease of native coronary artery without angina pectoris: Secondary | ICD-10-CM | POA: Diagnosis not present

## 2016-09-18 DIAGNOSIS — I5032 Chronic diastolic (congestive) heart failure: Secondary | ICD-10-CM | POA: Diagnosis not present

## 2016-09-18 DIAGNOSIS — N186 End stage renal disease: Secondary | ICD-10-CM | POA: Insufficient documentation

## 2016-09-18 DIAGNOSIS — K625 Hemorrhage of anus and rectum: Secondary | ICD-10-CM | POA: Diagnosis present

## 2016-09-18 DIAGNOSIS — R5383 Other fatigue: Secondary | ICD-10-CM

## 2016-09-18 LAB — COMPREHENSIVE METABOLIC PANEL
ALT: 25 U/L (ref 17–63)
ANION GAP: 14 (ref 5–15)
AST: 43 U/L — ABNORMAL HIGH (ref 15–41)
Albumin: 3.7 g/dL (ref 3.5–5.0)
Alkaline Phosphatase: 106 U/L (ref 38–126)
BUN: 27 mg/dL — ABNORMAL HIGH (ref 6–20)
CHLORIDE: 99 mmol/L — AB (ref 101–111)
CO2: 25 mmol/L (ref 22–32)
CREATININE: 6.05 mg/dL — AB (ref 0.61–1.24)
Calcium: 9 mg/dL (ref 8.9–10.3)
GFR calc non Af Amer: 9 mL/min — ABNORMAL LOW (ref >60)
GFR, EST AFRICAN AMERICAN: 10 mL/min — AB (ref >60)
Glucose, Bld: 157 mg/dL — ABNORMAL HIGH (ref 65–99)
Potassium: 4.5 mmol/L (ref 3.5–5.1)
SODIUM: 138 mmol/L (ref 135–145)
Total Bilirubin: 1.2 mg/dL (ref 0.3–1.2)
Total Protein: 7.7 g/dL (ref 6.5–8.1)

## 2016-09-18 LAB — I-STAT CG4 LACTIC ACID, ED: LACTIC ACID, VENOUS: 1.26 mmol/L (ref 0.5–1.9)

## 2016-09-18 LAB — CBC
HCT: 34.6 % — ABNORMAL LOW (ref 39.0–52.0)
HEMOGLOBIN: 10.9 g/dL — AB (ref 13.0–17.0)
MCH: 30.7 pg (ref 26.0–34.0)
MCHC: 31.5 g/dL (ref 30.0–36.0)
MCV: 97.5 fL (ref 78.0–100.0)
Platelets: 276 10*3/uL (ref 150–400)
RBC: 3.55 MIL/uL — AB (ref 4.22–5.81)
RDW: 17.1 % — ABNORMAL HIGH (ref 11.5–15.5)
WBC: 8.9 10*3/uL (ref 4.0–10.5)

## 2016-09-18 LAB — TYPE AND SCREEN
ABO/RH(D): O POS
Antibody Screen: NEGATIVE

## 2016-09-18 NOTE — ED Triage Notes (Signed)
Pt sts not feeling well since having stent placed; pt sts black stool and sts generalized weakness; pt dialysis pt with last treatment today

## 2016-09-18 NOTE — ED Provider Notes (Signed)
[facility] DEPT Provider Note   CSN: [id number] Arrival date & time: 09/18/16  1639     History   Chief Complaint Chief Complaint  Patient presents with  . Rectal Bleeding    HPI [name] is a 65 y.o. male.   HPI  Started the plavix 11/7, has had black stool over the last 3 weeks Not taking iron or pepto bismol Having black stool about twice per day, tarry stool Has had bleeding in the past, when he was heparin, then stopped that Hgb went down to 3 before, and 7 another time and got blood transfusions, don't remember the cause, thought he may have bleeding contributing to his symptoms  No hematemesis  Feeling fatigued over last several weeks, body aches, muscles sore, couple weeks During dialysis feels uncomfortable.  Thought he would feel better after stents were placed in November, but is feeling worse.    Past Medical History:  Diagnosis Date  . Allergic rhinitis, cause unspecified 02/24/2014  . Anemia 06/16/2011  . BENIGN PROSTATIC HYPERTROPHY 10/14/2009  . CAD, NATIVE VESSEL 02/06/2009   saw Dr. Missy Sabins last jan 2013  . Cervical radiculopathy, chronic 02/23/2016   Right c5-6 by NCS/EMG  . CHEST PAIN 03/29/2010  . COLONIC POLYPS, HX OF 10/14/2009  . CONGESTIVE HEART FAILURE 06/18/2007  . Dementia [id number]  . DEPRESSION 10/14/2009  . Depression 09/24/2015  . DIABETES MELLITUS, TYPE II 02/01/2010  . DIASTOLIC HEART FAILURE, CHRONIC 02/06/2009  . DIZZINESS 07/17/2010  . DYSLIPIDEMIA 06/18/2007  . DYSPNEA 10/29/2008  . ESRD (end stage renal disease) on dialysis ([place]) 08/04/2010   "TTS;  " (04/18/2015)  . FOOT PAIN 08/12/2008  . GAIT DISTURBANCE 03/03/2010  . GASTROENTERITIS, VIRAL 10/14/2009  . GERD 06/18/2007  . GOITER, MULTINODULAR 12/26/2007  . GOUT 06/18/2007  . GYNECOMASTIA 07/17/2010  . Hemodialysis access, fistula mature [facility])    Dialysis T-Th-Sa ([place]) Right upper arm fistula  . Hyperlipidemia 10/16/2011  . Hyperparathyroidism,  secondary ([place]) 06/16/2011  . HYPERTENSION 06/18/2007  . Hyperthyroidism   . HYPERTHYROIDISM 02/02/2010  . Hypocalcemia 06/07/2010  . Ischemic cardiomyopathy 06/16/2011  . NECK PAIN 07/31/2010  . ONYCHOMYCOSIS, TOENAILS 12/26/2007  . OSA on CPAP 10/16/2011  . Other malaise and fatigue 11/24/2009  . PERIPHERAL NEUROPATHY 06/18/2007  . Prostate cancer ([place])   . PULMONARY NODULE, RIGHT LOWER LOBE 06/08/2009  . Renal insufficiency   . Sleep apnea    cpap machine and o2  . TRANSAMINASES, SERUM, ELEVATED 02/01/2010  . Transfusion history    none recent  . Unspecified hypotension 01/30/2010    Patient Active Problem List   Diagnosis Date Noted  . Abdominal pain 08/27/2016  . Abnormal findings on diagnostic imaging of liver and biliary tract 08/27/2016  . Bloating symptom 08/27/2016  . Hematochezia 08/27/2016  . Early satiety 08/27/2016  . Eructation 08/27/2016  . Esophageal ulcer 08/27/2016  . Flatulence, eructation and gas pain 08/27/2016  . General weakness 08/27/2016  . Skin sensation disturbance 08/27/2016  . Stricture of esophagus 08/27/2016  . Alternating constipation and diarrhea 08/16/2016  . Status post coronary artery stent placement   . Abnormal EKG   . ST segment depression 08/05/2016  . PAD (peripheral artery disease) ([employer]) 06/18/2016  . Transient hypotension 04/29/2016  . Cervical radiculopathy, chronic 02/23/2016  . Memory loss 12/22/2015  . Left-sided weakness 12/22/2015  . Peripheral polyneuropathy ([place]) 12/22/2015  . DM (diabetes mellitus) type II controlled with renal manifestation ([place]) 10/31/2015  . Nausea and vomiting 10/31/2015  .  Malignant neoplasm of prostate ([place]) 10/24/2015  . Right leg pain 09/04/2015  . Chest pain with high risk for cardiac etiology 08/09/2015  . Acute encephalopathy 08/09/2015  . S/P right total knee replacement 08/09/2015  . Primary osteoarthritis of right knee 08/05/2015  . Neuropathy due to secondary diabetes mellitus ([place]) 08/05/2015  .  Arthritis of knee 08/02/2015  . Headache 06/08/2015  . Melena 04/19/2015  . Acute blood loss anemia 04/18/2015  . Dialysis patient ([place]) 04/18/2015  . Hyperthyroidism 03/11/2015  . Thigh pain 12/08/2014  . Nodule of right lung 09/14/2014  . PSA elevation 08/30/2014  . ESRD on hemodialysis ([place]) 08/16/2014  . Essential hypertension 08/16/2014  . Pre-transplant evaluation for kidney transplant 08/16/2014  . Allergic rhinitis 02/24/2014  . Elevated PSA 02/24/2014  . Vertigo 10/14/2013  . Cough 06/26/2013  . Numbness 05/15/2013  . ESRD (end stage renal disease) ([place]) 04/06/2013  . Chronic systolic heart failure ([place]) 02/09/2013  . Other complications due to renal dialysis device, implant, and graft 01/14/2013  . Right hand pain 03/28/2012  . Dysphagia 03/28/2012  . Hypogonadism, male 03/28/2012  . OSA (obstructive sleep apnea) 10/16/2011  . Hyperlipidemia 10/16/2011  . CAD (coronary artery disease) 09/28/2011  . NSTEMI (non-ST elevated myocardial infarction) ([place]) 09/28/2011  . AVM (arteriovenous malformation) 09/27/2011  . Anemia associated with acute blood loss 09/26/2011  . Anemia 06/16/2011  . Ischemic cardiomyopathy 06/16/2011  . Hyperparathyroidism, secondary ([place]) 06/16/2011  . Preventative health care 03/11/2011  . NECK PAIN 07/31/2010  . GYNECOMASTIA 07/17/2010  . DIZZINESS 07/17/2010  . GAIT DISTURBANCE 03/03/2010  . Thyrotoxicosis 02/02/2010  . Elevated levels of transaminase & lactic acid dehydrogenase 02/01/2010  . Other malaise and fatigue 11/24/2009  . Depression 10/14/2009  . BENIGN PROSTATIC HYPERTROPHY 10/14/2009  . COLONIC POLYPS, HX OF 10/14/2009  . Dementia 09/02/2009  . PULMONARY NODULE, RIGHT LOWER LOBE 06/08/2009  . Chronic diastolic congestive heart failure ([place]) 02/06/2009  . DYSPNEA 10/29/2008  . FOOT PAIN 08/12/2008  . ONYCHOMYCOSIS, TOENAILS 12/26/2007  . GOITER, MULTINODULAR 12/26/2007  . GOUT 06/18/2007  . Neuropathy ([place]) 06/18/2007  .  Hypertensive heart disease with CHF (congestive heart failure) ([place]) 06/18/2007  . Gastro-esophageal reflux disease without esophagitis 06/18/2007    Past Surgical History:  Procedure Laterality Date  . ARTERIOVENOUS GRAFT PLACEMENT Right 2009   forearm/notes 02/01/2011  . AV FISTULA PLACEMENT  11/07/2011   Procedure: INSERTION OF ARTERIOVENOUS (AV) GORE-TEX GRAFT ARM;  Surgeon: Tinnie Gens, MD;  Location: [facility];  Service: Vascular;  Laterality: Left;  . BACK SURGERY  1998  . BASCILIC VEIN TRANSPOSITION Right 02/27/2013   Procedure: BASCILIC VEIN TRANSPOSITION;  Surgeon: Mal Misty, MD;  Location: [facility];  Service: Vascular;  Laterality: Right;  Right Basilic Vein Transposition   . CARDIAC CATHETERIZATION N/A 08/06/2016   Procedure: Left Heart Cath and Coronary Angiography;  Surgeon: Jolaine Artist, MD;  Location: [facility] CV LAB;  Service: Cardiovascular;  Laterality: N/A;  . CARDIAC CATHETERIZATION N/A 08/07/2016   Procedure: Coronary/Graft Atherectomy-CSI LAD;  Surgeon: Peter M Martinique, MD;  Location: [facility] CV LAB;  Service: Cardiovascular;  Laterality: N/A;  . CERVICAL SPINE SURGERY  2/09   "to repair nerve problems in my left arm"  . CHOLECYSTECTOMY    . CORONARY ANGIOPLASTY WITH STENT PLACEMENT  06/11/2008  . CORONARY ANGIOPLASTY WITH STENT PLACEMENT  06/2007   TAXUS stent to RCA/notes 01/31/2011  . ESOPHAGOGASTRODUODENOSCOPY  09/28/2011   Procedure: ESOPHAGOGASTRODUODENOSCOPY (EGD);  Surgeon: Missy Sabins, MD;  Location: [facility] END[facility];  Service: Endoscopy;  Laterality: N/A;  . ESOPHAGOGASTRODUODENOSCOPY N/A 04/07/2015   Procedure: ESOPHAGOGASTRODUODENOSCOPY (EGD);  Surgeon: Teena Irani, MD;  Location: [facility];  Service: Endoscopy;  Laterality: N/A;  . ESOPHAGOGASTRODUODENOSCOPY N/A 04/19/2015   Procedure: ESOPHAGOGASTRODUODENOSCOPY (EGD);  Surgeon: Arta Silence, MD;  Location[facility] ENDOSCOPY;  Service: Endoscopy;  Laterality: N/A;  . FOREIGN BODY REMOVAL  09/2003   via upper  endoscopy/notes 02/12/2011  . GIVENS CAPSULE STUDY  09/30/2011   Procedure: GIVENS CAPSULE STUDY;  Surgeon: Jeryl Columbia, MD;  Location: [facility] END[facility]COPY;  Service: Endoscopy;  Laterality: N/A;  . INSERTION OF DIALYSIS CATHETER Right 2014  . INSERTION OF DIALYSIS CATHETER Left 02/11/2013   Procedure: INSERTION OF DIALYSIS CATHETER;  Surgeon: Conrad [place], MD;  Location: [facility];  Service: Vascular;  Laterality: Left;  Ultrasound guided  . REMOVAL OF A DIALYSIS CATHETER Right 02/11/2013   Procedure: REMOVAL OF A DIALYSIS CATHETER;  Surgeon: Conrad [place], MD;  Location: [facility];  Service: Vascular;  Laterality: Right;  . SAVORY DILATION N/A 04/07/2015   Procedure: SAVORY DILATION;  Surgeon: Teena Irani, MD;  Location: [facility] ENDOSCOPY;  Service: Endoscopy;  Laterality: N/A;  . SHUNTOGRAM N/A 09/20/2011   Procedure: [place]ney Mallet;  Surgeon: Conrad [place], MD;  Location: [facility] CATH LAB;  Service: Cardiovascular;  Laterality: N/A;  . TONSILLECTOMY    . TOTAL KNEE ARTHROPLASTY Right 08/02/2015   Procedure: TOTAL KNEE ARTHROPLASTY;  Surgeon: Renette Butters, MD;  Location: [facility];  Service: Orthopedics;  Laterality: Right;  . VENOGRAM N/A 01/26/2013   Procedure: VENOGRAM;  Surgeon: Angelia Mould, MD;  Location: [facility] CATH LAB;  Service: Cardiovascular;  Laterality: N/A;       Home Medications    Prior to Admission medications   Medication Sig Start Date End Date Taking? Authorizing Provider  acetaminophen (TYLENOL) 325 MG tablet Take 650 mg by mouth every 6 (six) hours as needed for headache.   Yes Historical Provider, MD  allopurinol (ZYLOPRIM) 100 MG tablet Take 1 tablet (100 mg total) by mouth daily. 07/20/16  Yes Biagio Borg, MD  aspirin 81 MG tablet Take 81 mg by mouth daily.   Yes Historical Provider, MD  clopidogrel (PLAVIX) 75 MG tablet Take 1 tablet (75 mg total) by mouth daily with breakfast. 09/05/16  Yes Jolaine Artist, MD  colesevelam (WELCHOL) 625 MG tablet TAKE THREE TABLETS BY MOUTH TWICE DAILY  WITH MEALS Patient taking differently: Take 1,250 mg by mouth 2 (two) times daily with a meal. TAKE THREE TABLETS BY MOUTH TWICE DAILY WITH MEALS 07/20/16  Yes Biagio Borg, MD  ezetimibe (ZETIA) 10 MG tablet Take 1 tablet (10 mg total) by mouth daily. 03/15/16  Yes Jolaine Artist, MD  gabapentin (NEURONTIN) 300 MG capsule 1-2 tabs by mouth at bedtime for sleep and pain Patient taking differently: Take 600 mg by mouth at bedtime. 1-2 tabs by mouth at bedtime for sleep and pain 07/20/16  Yes Biagio Borg, MD  lanthanum (FOSRENOL) 1000 MG chewable tablet Chew 1,000 mg by mouth 2 (two) times daily with a meal. Reported on 12/23/2015   Yes Historical Provider, MD  midodrine (PROAMATINE) 10 MG tablet Take 10 mg by mouth See admin instructions. Takes only on dialysis days-1 tab 08/27/16  Yes Historical Provider, MD  multivitamin (RENA-VIT) TABS tablet Take 1 tablet by mouth at bedtime. Reported on 10/24/2015   Yes Historical Provider, MD  pantoprazole (PROTONIX) 40 MG tablet Take 40 mg by  mouth 2 (two) times daily. 08/31/16  Yes Historical Provider, MD  repaglinide (PRANDIN) 0.5 MG tablet Take 1 tablet (0.5 mg total) by mouth daily with breakfast. 08/15/16  Yes Biagio Borg, MD  SENSIPAR 90 MG tablet Take 90 mg by mouth 2 (two) times daily. 09/18/16  Yes Historical Provider, MD    Family History Family History  Problem Relation Age of Onset  . Heart disease Sister   . Thyroid nodules Sister   . Heart disease Father   . Diabetes Father   . Kidney failure Father   . Hypertension Father   . Healthy Child   . Healthy Child   . Healthy Child   . Cancer Neg Hx     Social History Social History  Substance Use Topics  . Smoking status: Former Smoker    Packs/day: 1.00    Years: 25.00    Types: Cigarettes    Quit date: 10/01/2005  . Smokeless tobacco: Former Systems developer    Quit date: 10/01/2005     Comment: Quit smoking 2007 Smoked x 25 years 1/2 ppd.  . Alcohol use No     Allergies   Cephalexin;  Statins; and Ciprofloxacin   Review of Systems Review of Systems  Constitutional: Positive for fatigue. Negative for fever.  HENT: Negative for congestion and sore throat.   Eyes: Negative for visual disturbance.  Respiratory: Negative for shortness of breath.   Cardiovascular: Negative for chest pain.  Gastrointestinal: Positive for blood in stool (black stool). Negative for abdominal pain, nausea and vomiting.  Musculoskeletal: Negative for back pain and neck stiffness.  Skin: Negative for rash.  Neurological: Negative for syncope and headaches.     Physical Exam Updated Vital Signs BP 112/74   Pulse 88   Temp 98.2 F (36.8 C) (Oral)   Resp 15   SpO2 97%   Physical Exam  Constitutional: He is oriented to person, place, and time. He appears well-developed and well-nourished. No distress.  HENT:  Head: Normocephalic and atraumatic.  Eyes: Conjunctivae and EOM are normal.  Neck: Normal range of motion.  Cardiovascular: Normal rate, regular rhythm, normal heart sounds and intact distal pulses.  Exam reveals no gallop and no friction rub.   No murmur heard. Pulmonary/Chest: Effort normal and breath sounds normal. No respiratory distress. He has no wheezes. He has no rales.  Abdominal: Soft. He exhibits no distension. There is no tenderness. There is no guarding.  Genitourinary:  Genitourinary Comments: Stool dark brown, no tarry melena, no gross blood  Musculoskeletal: He exhibits no edema.  Neurological: He is alert and oriented to person, place, and time.  Skin: Skin is warm and dry. He is not diaphoretic.  Nursing note and vitals reviewed.    ED Treatments / Results  Labs (all labs ordered are listed, but only abnormal results are displayed) Labs Reviewed  COMPREHENSIVE METABOLIC PANEL - Abnormal; Notable for the following:       Result Value   Chloride 99 (*)    Glucose, Bld 157 (*)    BUN 27 (*)    Creatinine, Ser 6.05 (*)    AST 43 (*)    GFR calc non Af  Amer 9 (*)    GFR calc Af Amer 10 (*)    All other components within normal limits  CBC - Abnormal; Notable for the following:    RBC 3.55 (*)    Hemoglobin 10.9 (*)    HCT 34.6 (*)    RDW 17.1 (*)  All other components within normal limits  I-STAT CG4 LACTIC ACID, ED  TYPE AND SCREEN    EKG  EKG Interpretation None       Radiology No results found.  Procedures Procedures (including critical care time)  Medications Ordered in ED Medications - No data to display    EMERGENCY DEPARTMENT [place] CARDIAC EXAM "Study: Limited Ultrasound of the heart and pericardium"  INDICATIONS: Severe fatigue, ESRD, cath 79mo ago, eval for effusion Multiple views of the heart and pericardium were obtained in real-time with a multi-frequency probe.  PERFORMED DP:[id number]  IMAGES ARCHIVED?: Yes  FINDINGS: No pericardial effusion, mild decrease contractility/WMA  LIMITATIONS:  Body habitus  VIEWS USED: Subcostal 4 chamber, Parasternal long axis, Parasternal short axis, Apical 4 chamber  and Inferior Vena Cava  INTERPRETATION: Cardiac activity present, Pericardial effusioin absent and Volume status normal limited view of IVC  CPT Code: 36144-31 (limited transthoracic cardiac)   Initial Impression / Assessment and Plan / ED Course  I have reviewed the triage vital signs and the nursing notes.  Pertinent labs & imaging results that were available during my care of the patient were reviewed by me and considered in my medical decision making (see chart for details).  Clinical Course    65yo male with hx of ESRD, NSTEMI in November, DM, diastolic heart failure presents with concern for fatigue, generalized weakness and black stool for one month.  Patient concerned he has developed anemia as cause of symptoms, however despite dark stool for one month his hgb is stable and feel this is unlikely etiology of his symptoms. Stool does not appear consistent with acute bleed, do not feel hemoccult will  change course of care at this time.  His electrolytes are WNL. He denies infectious symptoms and given duration doubt sepsis as etiology of fatigue. No chest pain, no dyspnea, normal O2 saturation, doubt acute CHF exacerbation or ACS.  Did bedside US to evaluate for pericardial effusion and no pericardial effusion present. Patient hemodynamically stable many hours in ED.  Feel continued outpt evaluation of his symptoms is appropriate including outpt TSH. Patient discharged in stable condition with understanding of reasons to return.     Final Clinical Impressions(s) / ED Diagnoses   Final diagnoses:  Other fatigue  Body aches    New Prescriptions Discharge Medication List as of 09/18/2016 10:58 PM       Gareth Morgan, MD 09/19/16 1443

## 2016-09-19 NOTE — ED Notes (Signed)
Signature pad not working. Attempted multiple times to get it up and running. Pt verbalized understanding of d/c instructions and voices no questions/concerns @ this time.

## 2016-09-20 DIAGNOSIS — E119 Type 2 diabetes mellitus without complications: Secondary | ICD-10-CM | POA: Diagnosis not present

## 2016-09-20 DIAGNOSIS — N2581 Secondary hyperparathyroidism of renal origin: Secondary | ICD-10-CM | POA: Diagnosis not present

## 2016-09-20 DIAGNOSIS — D631 Anemia in chronic kidney disease: Secondary | ICD-10-CM | POA: Diagnosis not present

## 2016-09-20 DIAGNOSIS — N186 End stage renal disease: Secondary | ICD-10-CM | POA: Diagnosis not present

## 2016-09-21 DIAGNOSIS — M1712 Unilateral primary osteoarthritis, left knee: Secondary | ICD-10-CM | POA: Diagnosis not present

## 2016-09-22 DIAGNOSIS — N186 End stage renal disease: Secondary | ICD-10-CM | POA: Diagnosis not present

## 2016-09-22 DIAGNOSIS — E119 Type 2 diabetes mellitus without complications: Secondary | ICD-10-CM | POA: Diagnosis not present

## 2016-09-22 DIAGNOSIS — D631 Anemia in chronic kidney disease: Secondary | ICD-10-CM | POA: Diagnosis not present

## 2016-09-22 DIAGNOSIS — N2581 Secondary hyperparathyroidism of renal origin: Secondary | ICD-10-CM | POA: Diagnosis not present

## 2016-09-25 DIAGNOSIS — N186 End stage renal disease: Secondary | ICD-10-CM | POA: Diagnosis not present

## 2016-09-25 DIAGNOSIS — E119 Type 2 diabetes mellitus without complications: Secondary | ICD-10-CM | POA: Diagnosis not present

## 2016-09-25 DIAGNOSIS — D631 Anemia in chronic kidney disease: Secondary | ICD-10-CM | POA: Diagnosis not present

## 2016-09-25 DIAGNOSIS — N2581 Secondary hyperparathyroidism of renal origin: Secondary | ICD-10-CM | POA: Diagnosis not present

## 2016-09-27 DIAGNOSIS — N186 End stage renal disease: Secondary | ICD-10-CM | POA: Diagnosis not present

## 2016-09-27 DIAGNOSIS — D631 Anemia in chronic kidney disease: Secondary | ICD-10-CM | POA: Diagnosis not present

## 2016-09-27 DIAGNOSIS — E119 Type 2 diabetes mellitus without complications: Secondary | ICD-10-CM | POA: Diagnosis not present

## 2016-09-27 DIAGNOSIS — N2581 Secondary hyperparathyroidism of renal origin: Secondary | ICD-10-CM | POA: Diagnosis not present

## 2016-09-29 DIAGNOSIS — D631 Anemia in chronic kidney disease: Secondary | ICD-10-CM | POA: Diagnosis not present

## 2016-09-29 DIAGNOSIS — N2581 Secondary hyperparathyroidism of renal origin: Secondary | ICD-10-CM | POA: Diagnosis not present

## 2016-09-29 DIAGNOSIS — N186 End stage renal disease: Secondary | ICD-10-CM | POA: Diagnosis not present

## 2016-09-29 DIAGNOSIS — E119 Type 2 diabetes mellitus without complications: Secondary | ICD-10-CM | POA: Diagnosis not present

## 2016-09-30 DIAGNOSIS — Z992 Dependence on renal dialysis: Secondary | ICD-10-CM | POA: Diagnosis not present

## 2016-09-30 DIAGNOSIS — N186 End stage renal disease: Secondary | ICD-10-CM | POA: Diagnosis not present

## 2016-09-30 DIAGNOSIS — E1129 Type 2 diabetes mellitus with other diabetic kidney complication: Secondary | ICD-10-CM | POA: Diagnosis not present

## 2016-10-02 DIAGNOSIS — N186 End stage renal disease: Secondary | ICD-10-CM | POA: Diagnosis not present

## 2016-10-02 DIAGNOSIS — D631 Anemia in chronic kidney disease: Secondary | ICD-10-CM | POA: Diagnosis not present

## 2016-10-02 DIAGNOSIS — N2581 Secondary hyperparathyroidism of renal origin: Secondary | ICD-10-CM | POA: Diagnosis not present

## 2016-10-02 DIAGNOSIS — E119 Type 2 diabetes mellitus without complications: Secondary | ICD-10-CM | POA: Diagnosis not present

## 2016-10-04 ENCOUNTER — Encounter (HOSPITAL_COMMUNITY)
Admission: RE | Admit: 2016-10-04 | Discharge: 2016-10-04 | Disposition: A | Discharge status: Home / Self Care | Payer: Medicare Other | Source: Ambulatory Visit | Attending: Internal Medicine | Admitting: Internal Medicine

## 2016-10-04 ENCOUNTER — Encounter (HOSPITAL_COMMUNITY): Payer: Self-pay

## 2016-10-04 VITALS — BP 170/86 | HR 79 | Ht 69.0 in | Wt 237.4 lb

## 2016-10-04 DIAGNOSIS — I214 Non-ST elevation (NSTEMI) myocardial infarction: Secondary | ICD-10-CM

## 2016-10-04 DIAGNOSIS — E119 Type 2 diabetes mellitus without complications: Secondary | ICD-10-CM | POA: Diagnosis not present

## 2016-10-04 DIAGNOSIS — Z955 Presence of coronary angioplasty implant and graft: Secondary | ICD-10-CM | POA: Diagnosis not present

## 2016-10-04 DIAGNOSIS — D631 Anemia in chronic kidney disease: Secondary | ICD-10-CM | POA: Diagnosis not present

## 2016-10-04 DIAGNOSIS — Z48812 Encounter for surgical aftercare following surgery on the circulatory system: Secondary | ICD-10-CM | POA: Diagnosis not present

## 2016-10-04 DIAGNOSIS — N2581 Secondary hyperparathyroidism of renal origin: Secondary | ICD-10-CM | POA: Diagnosis not present

## 2016-10-04 DIAGNOSIS — N186 End stage renal disease: Secondary | ICD-10-CM | POA: Diagnosis not present

## 2016-10-04 NOTE — Progress Notes (Signed)
Cardiac Individual Treatment Plan  Patient Details  Name: [name] MRN: [id number] Date of Birth: 1950/10/05 Referring Provider:   Flowsheet Row CARDIAC REHAB PHASE II ORIENTATION from 10/04/2016 in [place]  Referring Provider  Bensimhon, Daniel MD      Initial Encounter Date:  [place] PHASE II ORIENTATION from 10/04/2016 in [place]  Date  10/04/16  Referring Provider  Glori Bickers MD      Visit Diagnosis: 08/04/16 NSTEMI (non-ST elevated myocardial infarction) ([place])  08/04/16 Status post coronary artery stent placement  Patient's Home Medications on Admission:  Current Outpatient Prescriptions:  .  acetaminophen (TYLENOL) 325 MG tablet, Take 650 mg by mouth every 6 (six) hours as needed for headache., Disp: , Rfl:  .  allopurinol (ZYLOPRIM) 100 MG tablet, Take 1 tablet (100 mg total) by mouth daily., Disp: 90 tablet, Rfl: 3 .  aspirin 81 MG tablet, Take 81 mg by mouth daily., Disp: , Rfl:  .  clopidogrel (PLAVIX) 75 MG tablet, Take 1 tablet (75 mg total) by mouth daily with breakfast., Disp: 30 tablet, Rfl: 6 .  colesevelam (WELCHOL) 625 MG tablet, TAKE THREE TABLETS BY MOUTH TWICE DAILY WITH MEALS (Patient taking differently: Take 1,250 mg by mouth 2 (two) times daily with a meal. TAKE THREE TABLETS BY MOUTH TWICE DAILY WITH MEALS), Disp: 180 tablet, Rfl: 11 .  ezetimibe (ZETIA) 10 MG tablet, Take 1 tablet (10 mg total) by mouth daily. (Patient taking differently: Take 10 mg by mouth at bedtime. ), Disp: 90 tablet, Rfl: 3 .  gabapentin (NEURONTIN) 300 MG capsule, 1-2 tabs by mouth at bedtime for sleep and pain (Patient taking differently: Take 600 mg by mouth at bedtime. 1-2 tabs by mouth at bedtime for sleep and pain), Disp: 180 capsule, Rfl: 3 .  lanthanum (FOSRENOL) 1000 MG chewable tablet, Chew 1,000 mg by mouth 2 (two) times daily with a meal. Reported on 12/23/2015, Disp: , Rfl:  .   midodrine (PROAMATINE) 10 MG tablet, Take 10 mg by mouth See admin instructions. Takes only on dialysis days-1 tab, Disp: , Rfl:  .  multivitamin (RENA-VIT) TABS tablet, Take 1 tablet by mouth at bedtime. Reported on 10/24/2015, Disp: , Rfl:  .  pantoprazole (PROTONIX) 40 MG tablet, Take 40 mg by mouth daily. , Disp: , Rfl:  .  repaglinide (PRANDIN) 0.5 MG tablet, Take 1 tablet (0.5 mg total) by mouth daily with breakfast., Disp: 30 tablet, Rfl: 11 .  SENSIPAR 90 MG tablet, Take 90 mg by mouth 2 (two) times daily., Disp: , Rfl:   Past Medical History: Past Medical History:  Diagnosis Date  . Allergic rhinitis, cause unspecified 02/24/2014  . Anemia 06/16/2011  . BENIGN PROSTATIC HYPERTROPHY 10/14/2009  . CAD, NATIVE VESSEL 02/06/2009   saw Dr. Missy Sabins last jan 2013  . Cervical radiculopathy, chronic 02/23/2016   Right c5-6 by NCS/EMG  . CHEST PAIN 03/29/2010  . COLONIC POLYPS, HX OF 10/14/2009  . CONGESTIVE HEART FAILURE 06/18/2007  . Dementia [id number]  . DEPRESSION 10/14/2009  . Depression 09/24/2015  . DIABETES MELLITUS, TYPE II 02/01/2010  . DIASTOLIC HEART FAILURE, CHRONIC 02/06/2009  . DIZZINESS 07/17/2010  . DYSLIPIDEMIA 06/18/2007  . DYSPNEA 10/29/2008  . ESRD (end stage renal disease) on dialysis ([place]) 08/04/2010   "TTS;  " (04/18/2015)  . FOOT PAIN 08/12/2008  . GAIT DISTURBANCE 03/03/2010  . GASTROENTERITIS, VIRAL 10/14/2009  . GERD 06/18/2007  . GOITER, MULTINODULAR 12/26/2007  .  GOUT 06/18/2007  . GYNECOMASTIA 07/17/2010  . Hemodialysis access, fistula mature [facility])    Dialysis [facility] ([place]) Right upper arm fistula  . Hyperlipidemia 10/16/2011  . Hyperparathyroidism, secondary ([place]) 06/16/2011  . HYPERTENSION 06/18/2007  . Hyperthyroidism   . HYPERTHYROIDISM 02/02/2010  . Hypocalcemia 06/07/2010  . Ischemic cardiomyopathy 06/16/2011  . NECK PAIN 07/31/2010  . NSTEMI (non-ST elevated myocardial infarction) ([place]) 08/04/2016  . ONYCHOMYCOSIS, TOENAILS 12/26/2007  .  OSA on CPAP 10/16/2011  . Other malaise and fatigue 11/24/2009  . PERIPHERAL NEUROPATHY 06/18/2007  . Prostate cancer ([place])   . PULMONARY NODULE, RIGHT LOWER LOBE 06/08/2009  . Renal insufficiency   . Sleep apnea    cpap machine and o2  . TRANSAMINASES, SERUM, ELEVATED 02/01/2010  . Transfusion history    none recent  . Unspecified hypotension 01/30/2010    Tobacco Use: History  Smoking Status  . Former Smoker  . Packs/day: 1.00  . Years: 25.00  . Types: Cigarettes  . Quit date: 10/01/2005  Smokeless Tobacco  . Former Systems developer  . Quit date: 10/01/2005    Comment: Quit smoking 2007 Smoked x 25 years 1/2 ppd.    Labs: Recent Review Flowsheet Data    Labs for ITP Cardiac and Pulmonary Rehab Latest Ref Rng & Units 08/09/2015 10/31/2015 02/17/2016 06/18/2016 08/04/2016   Cholestrol 0 - 200 mg/dL - - - - -   LDLCALC 0 - 99 mg/dL - - - - -   LDLDIRECT mg/dL - - - - -   HDL >39.00 mg/dL - - - - -   Trlycerides 0.0 - 149.0 mg/dL - - - - -   Hemoglobin A1c 4.6 - 6.5 % - 4.7 6.6 7.3(H) -   PHART 7.350 - 7.450 - - - - -   PCO2ART 35.0 - 45.0 mmHg - - - - -   HCO3 20.0 - 24.0 mEq/L - - - - -   TCO2 0 - 100 mmol/L 29 - - - 30   O2SAT % - - - - -      Capillary Blood Glucose: Lab Results  Component Value Date   GLUCAP 142 (H) 08/08/2016   GLUCAP 144 (H) 08/08/2016   GLUCAP 97 08/07/2016   GLUCAP 107 (H) 08/07/2016   GLUCAP 111 (H) 08/07/2016     Exercise Target Goals: Date: 10/04/16  Exercise Program Goal: Individual exercise prescription set with THRR, safety & activity barriers. Participant demonstrates ability to understand and report RPE using BORG scale, to self-measure pulse accurately, and to acknowledge the importance of the exercise prescription.  Exercise Prescription Goal: Starting with aerobic activity 30 plus minutes a day, 3 days per week for initial exercise prescription. Provide home exercise prescription and guidelines that participant acknowledges understanding prior to  discharge.  Activity Barriers & Risk Stratification:     Activity Barriers & Cardiac Risk Stratification - 10/04/16 0858      Activity Barriers & Cardiac Risk Stratification   Activity Barriers Balance Concerns;History of Falls;Muscular Weakness;Deconditioning;Assistive Device;Other (comment)   Comments L knee pain   Cardiac Risk Stratification High      6 Minute Walk:     6 Minute Walk    Row Name 10/04/16 1232         6 Minute Walk   Phase Initial     Distance 0.35 feet     Walk Time 6 minutes     # of Rest Breaks 0     MPH 0.21  METS 2.1     RPE 11     VO2 Peak 7.35     Symptoms No     Resting HR 79 bpm     Resting BP 170/86     Max Ex. HR 108 bpm     Max Ex. BP 158/80     2 Minute Post BP 158/86  10 minute post 158/80        Initial Exercise Prescription:     Initial Exercise Prescription - 10/04/16 1300      Date of Initial Exercise RX and Referring Provider   Date 10/04/16   Referring Provider Bensimhon, Daniel MD     Recumbant Bike   Level 2   Minutes 10   METs 2     NuStep   Level 2   Minutes 10   METs 2     Arm Ergometer   Level 1   Minutes 10   METs 2     Prescription Details   Frequency (times per week) 3   Duration Progress to 30 minutes of continuous aerobic without signs/symptoms of physical distress     Intensity   THRR 40-80% of Max Heartrate 65-131   Ratings of Perceived Exertion 11-13   Perceived Dyspnea 0-4     Progression   Progression Continue progressive overload as per policy without signs/symptoms or physical distress.     Resistance Training   Training Prescription Yes   Weight 2lbs   Reps 10-12      Perform Capillary Blood Glucose checks as needed.  Exercise Prescription Changes:   Exercise Comments:   Discharge Exercise Prescription (Final Exercise Prescription Changes):   Nutrition:  Target Goals: Understanding of nutrition guidelines, daily intake of sodium 1500mg , cholesterol 200mg ,  calories 30% from fat and 7% or less from saturated fats, daily to have 5 or more servings of fruits and vegetables.  Biometrics:     Pre Biometrics - 10/04/16 1256      Pre Biometrics   Waist Circumference 48.5 inches   Hip Circumference 31.75 inches   Waist to Hip Ratio 1.53 %   Triceps Skinfold 37 mm   Grip Strength 26 kg   Flexibility 0 in  balance concerns/balance deficit   Single Leg Stand 0 seconds       Nutrition Therapy Plan and Nutrition Goals:   Nutrition Discharge: Nutrition Scores:   Nutrition Goals Re-Evaluation:   Psychosocial: Target Goals: Acknowledge presence or absence of depression, maximize coping skills, provide positive support system. Participant is able to verbalize types and ability to use techniques and skills needed for reducing stress and depression.  Initial Review & Psychosocial Screening:     Initial Psych Review & Screening - 10/04/16 1829      Initial Review   Current issues with Current Depression;Current Stress Concerns  Pt feels tired all the time and hopes coming to exercise will help him feel better.   Source of Stress Concerns Chronic Illness;Unable to participate in former interests or hobbies   Comments Pt has ESRD and is on dialysis three times a week.  Pt is also [place]? Yes   Concerns Inappropriate over/under dependence on family/friends   Comments Pt wife accompanied pt to rehab orientation.  Pt is very dependent upon his wife for completing forms, talking with the pharmacist and answering questions regarding diet     Barriers   Psychosocial barriers to participate in program The patient  should benefit from training in stress management and relaxation.     Screening Interventions   Interventions Encouraged to exercise      Quality of Life Scores:     Quality of Life - 10/04/16 1357      Quality of Life Scores   Health/Function Pre 9.4 %   Socioeconomic Pre 12.88 %    Psych/Spiritual Pre 19.2 %   Family Pre 23.25 %   GLOBAL Pre 13.71 %      PHQ-9: Recent Review Flowsheet Data    Depression screen [facility] 2/9 12/29/2015 10/24/2015 10/21/2015 01/13/2014   Decreased Interest 0 0 0 0   Down, Depressed, Hopeless 0 0 0 2   PHQ - 2 Score 0 0 0 2      Psychosocial Evaluation and Intervention:     Psychosocial Evaluation - 10/04/16 1832      Psychosocial Evaluation & Interventions   Interventions Stress management education;Relaxation education   Continued Psychosocial Services Needed Yes     Discharge Psychosocial Assessment & Intervention   Discharge Continue support measures as needed      Psychosocial Re-Evaluation:   Vocational Rehabilitation: Provide vocational rehab assistance to qualifying candidates.   Vocational Rehab Evaluation & Intervention:     Vocational Rehab - 10/04/16 1832      Initial Vocational Rehab Evaluation & Intervention   Assessment shows need for Vocational Rehabilitation No  Pt unable to return to work due to disability.      Education: Education Goals: Education classes will be provided on a weekly basis, covering required topics. Participant will state understanding/return demonstration of topics presented.  Learning Barriers/Preferences:     Learning Barriers/Preferences - 10/04/16 1356      Learning Barriers/Preferences   Learning Barriers None      Education Topics: Count Your Pulse:  -Group instruction provided by verbal instruction, demonstration, patient participation and written materials to support subject.  Instructors address importance of being able to find your pulse and how to count your pulse when at home without a heart monitor.  Patients get hands on experience counting their pulse with staff help and individually.   Heart Attack, Angina, and Risk Factor Modification:  -Group instruction provided by verbal instruction, video, and written materials to support subject.  Instructors  address signs and symptoms of angina and heart attacks.    Also discuss risk factors for heart disease and how to make changes to improve heart health risk factors.   Functional Fitness:  -Group instruction provided by verbal instruction, demonstration, patient participation, and written materials to support subject.  Instructors address safety measures for doing things around the house.  Discuss how to get up and down off the floor, how to pick things up properly, how to safely get out of a chair without assistance, and balance training.   Meditation and Mindfulness:  -Group instruction provided by verbal instruction, patient participation, and written materials to support subject.  Instructor addresses importance of mindfulness and meditation practice to help reduce stress and improve awareness.  Instructor also leads participants through a meditation exercise.    Stretching for Flexibility and Mobility:  -Group instruction provided by verbal instruction, patient participation, and written materials to support subject.  Instructors lead participants through series of stretches that are designed to increase flexibility thus improving mobility.  These stretches are additional exercise for major muscle groups that are typically performed during regular warm up and cool down.   Hands Only CPR Anytime:  -Group instruction provided by  verbal instruction, video, patient participation and written materials to support subject.  Instructors co-teach with AHA video for hands only CPR.  Participants get hands on experience with mannequins.   Nutrition I class: Heart Healthy Eating:  -Group instruction provided by PowerPoint slides, verbal discussion, and written materials to support subject matter. The instructor gives an explanation and review of the Therapeutic Lifestyle Changes diet recommendations, which includes a discussion on lipid goals, dietary fat, sodium, fiber, plant stanol/sterol esters,  sugar, and the components of a well-balanced, healthy diet.   Nutrition II class: Lifestyle Skills:  -Group instruction provided by PowerPoint slides, verbal discussion, and written materials to support subject matter. The instructor gives an explanation and review of label reading, grocery shopping for heart health, heart healthy recipe modifications, and ways to make healthier choices when eating out.   Diabetes Question & Answer:  -Group instruction provided by PowerPoint slides, verbal discussion, and written materials to support subject matter. The instructor gives an explanation and review of diabetes co-morbidities, pre- and post-prandial blood glucose goals, pre-exercise blood glucose goals, signs, symptoms, and treatment of hypoglycemia and hyperglycemia, and foot care basics.   Diabetes Blitz:  -Group instruction provided by PowerPoint slides, verbal discussion, and written materials to support subject matter. The instructor gives an explanation and review of the physiology behind type 1 and type 2 diabetes, diabetes medications and rational behind using different medications, pre- and post-prandial blood glucose recommendations and Hemoglobin A1c goals, diabetes diet, and exercise including blood glucose guidelines for exercising safely.    Portion Distortion:  -Group instruction provided by PowerPoint slides, verbal discussion, written materials, and food models to support subject matter. The instructor gives an explanation of serving size versus portion size, changes in portions sizes over the last 20 years, and what consists of a serving from each food group.   Stress Management:  -Group instruction provided by verbal instruction, video, and written materials to support subject matter.  Instructors review role of stress in heart disease and how to cope with stress positively.     Exercising on Your Own:  -Group instruction provided by verbal instruction, power point, and written  materials to support subject.  Instructors discuss benefits of exercise, components of exercise, frequency and intensity of exercise, and end points for exercise.  Also discuss use of nitroglycerin and activating EMS.  Review options of places to exercise outside of rehab.  Review guidelines for sex with heart disease.   Cardiac Drugs I:  -Group instruction provided by verbal instruction and written materials to support subject.  Instructor reviews cardiac drug classes: antiplatelets, anticoagulants, beta blockers, and statins.  Instructor discusses reasons, side effects, and lifestyle considerations for each drug class.   Cardiac Drugs II:  -Group instruction provided by verbal instruction and written materials to support subject.  Instructor reviews cardiac drug classes: angiotensin converting enzyme inhibitors (ACE-I), angiotensin II receptor blockers (ARBs), nitrates, and calcium channel blockers.  Instructor discusses reasons, side effects, and lifestyle considerations for each drug class.   Anatomy and Physiology of the Circulatory System:  -Group instruction provided by verbal instruction, video, and written materials to support subject.  Reviews functional anatomy of heart, how it relates to various diagnoses, and what role the heart plays in the overall system.   Knowledge Questionnaire Score:     Knowledge Questionnaire Score - 10/04/16 1224      Knowledge Questionnaire Score   Pre Score 18/28      Core Components/Risk Factors/Patient Goals at Admission:  Personal Goals and Risk Factors at Admission - 10/04/16 0859      Core Components/Risk Factors/Patient Goals on Admission    Weight Management Yes;Obesity;Weight Loss   Intervention Weight Management: Develop a combined nutrition and exercise program designed to reach desired caloric intake, while maintaining appropriate intake of nutrient and fiber, sodium and fats, and appropriate energy expenditure required for the  weight goal.;Weight Management: Provide education and appropriate resources to help participant work on and attain dietary goals.;Weight Management/Obesity: Establish reasonable short term and long term weight goals.;Obesity: Provide education and appropriate resources to help participant work on and attain dietary goals.   Expected Outcomes Short Term: Continue to assess and modify interventions until short term weight is achieved;Weight Maintenance: Understanding of the daily nutrition guidelines, which includes 25-35% calories from fat, 7% or less cal from saturated fats, less than 200mg  cholesterol, less than 1.5gm of sodium, & 5 or more servings of fruits and vegetables daily;Understanding recommendations for meals to include 15-35% energy as protein, 25-35% energy from fat, 35-60% energy from carbohydrates, less than 200mg  of dietary cholesterol, 20-35 gm of total fiber daily;Weight Loss: Understanding of general recommendations for a balanced deficit meal plan, which promotes 1-2 lb weight loss per week and includes a negative energy balance of [phone] kcal/d;Understanding of distribution of calorie intake throughout the day with the consumption of 4-5 meals/snacks;Weight Gain: Understanding of general recommendations for a high calorie, high protein meal plan that promotes weight gain by distributing calorie intake throughout the day with the consumption for 4-5 meals, snacks, and/or supplements   Sedentary Yes   Intervention Provide advice, education, support and counseling about physical activity/exercise needs.;Develop an individualized exercise prescription for aerobic and resistive training based on initial evaluation findings, risk stratification, comorbidities and participant's personal goals.   Expected Outcomes Achievement of increased cardiorespiratory fitness and enhanced flexibility, muscular endurance and strength shown through measurements of functional capacity and personal statement of  participant.   Increase Strength and Stamina Yes   Intervention Provide advice, education, support and counseling about physical activity/exercise needs.;Develop an individualized exercise prescription for aerobic and resistive training based on initial evaluation findings, risk stratification, comorbidities and participant's personal goals.   Expected Outcomes Achievement of increased cardiorespiratory fitness and enhanced flexibility, muscular endurance and strength shown through measurements of functional capacity and personal statement of participant.   Improve shortness of breath with ADL's Yes   Intervention Provide education, individualized exercise plan and daily activity instruction to help decrease symptoms of SOB with activities of daily living.   Expected Outcomes Short Term: Achieves a reduction of symptoms when performing activities of daily living.   Diabetes Yes   Intervention Provide education about signs/symptoms and action to take for hypo/hyperglycemia.;Provide education about proper nutrition, including hydration, and aerobic/resistive exercise prescription along with prescribed medications to achieve blood glucose in normal ranges: Fasting glucose 65-99 mg/dL   Expected Outcomes Short Term: Participant verbalizes understanding of the signs/symptoms and immediate care of hyper/hypoglycemia, proper foot care and importance of medication, aerobic/resistive exercise and nutrition plan for blood glucose control.;Long Term: Attainment of HbA1C < 7%.   Lipids Yes   Intervention Provide education and support for participant on nutrition & aerobic/resistive exercise along with prescribed medications to achieve LDL 70mg , HDL >40mg .   Expected Outcomes Short Term: Participant states understanding of desired cholesterol values and is compliant with medications prescribed. Participant is following exercise prescription and nutrition guidelines.;Long Term: Cholesterol controlled with medications  as prescribed, with individualized exercise RX and with  personalized nutrition plan. Value goals: LDL < 70mg , HDL > 40 mg.   Stress Yes   Intervention Offer individual and/or small group education and counseling on adjustment to heart disease, stress management and health-related lifestyle change. Teach and support self-help strategies.;Refer participants experiencing significant psychosocial distress to appropriate mental health specialists for further evaluation and treatment. When possible, include family members and significant others in education/counseling sessions.   Expected Outcomes Short Term: Participant demonstrates changes in health-related behavior, relaxation and other stress management skills, ability to obtain effective social support, and compliance with psychotropic medications if prescribed.;Long Term: Emotional wellbeing is indicated by absence of clinically significant psychosocial distress or social isolation.   Personal Goal Other Yes   Personal Goal short: improve strength, gait and stability    long: improve standing tolerance and UE strength   Intervention Provide exercise programming to assist with improving cardiovascular fitness and exercise tolerance. Provide an exercise routine to assist with improving strength, balance and stability.   Expected Outcomes Pt will be able to improve standing tolerance and global strength to improve overall quality of life.      Core Components/Risk Factors/Patient Goals Review:    Core Components/Risk Factors/Patient Goals at Discharge (Final Review):    ITP Comments:     ITP Comments    Row Name 10/04/16 0857           ITP Comments Dr. Fransico Him, Medical Director          Comments:  Pt is today for orientation to Phase II Cardiac Rehab from 0800-1030. Pt has ESRD and receives dialysis treatment on Tuesday, Thursday and Saturday at the [facility] dialysis clinic.  Pt is under the care of Dr. Mercy Moore.  Pt is  accompanied by his wife and arrived via wheelchair.  Pt is very deconditioned and needed assistance to stand.  Patient used walker to help with ambulation with rehab staff member close by for support.  Pt participated in  5 minutes of warm up stretching on the nustep.  Since pt has great difficulty with ambulation, the Exercise Specialist opted to use the nustep for his sub max exercise test.  Pt pre exercise bp was elevated.  Pt wife remarked that he did not have his medications yet.  He typically will hold them until after he has dialysis so he will not be hypotensive.  Monitor showed SR/ST with any weight bearing with no noted ecotpy.  Pt able to tolerate the 6 minute test with no difficulty.  Pt stated that he enjoyed doing the the activity. Pt bp returned to WNL during and after the exercise test was completed. Brief Psychosocial Assessment revealed some anxiety and stress related to dealing with chronic illness.  Pt is dependent upon his wife for any medical concerns.Will continue to monitor and intervene as needed.  Pt advised he will need to hold on full exercise until cleared by his renal doctor - Dr. Mercy Moore to participate and provide any bp parameters. Pt is looking forward to returning on next week. Pt given copy of rehab report to share with the dialysis clinic staff. Cherre Huger, BSN

## 2016-10-04 NOTE — Progress Notes (Signed)
Cardiac Rehab Medication Review by a Pharmacist  Does the patient  feel that his/her medications are working for him/her?  YES  Has the patient been experiencing any side effects to the medications prescribed?  no  Does the patient measure his/her own blood pressure or blood glucose at home?  yes   Does the patient have any problems obtaining medications due to transportation or finances?   Not at this time, patient's wife states that sensipar and fosrenol are both expensive and they were obtaining them through a patient assistance program. Unfortunately, this program has run out of funds per the wife. She has been applying through another program and the patient has ~1 month supply of both in the meantime.   Understanding of regimen: good Understanding of indications: good Potential of compliance: good    Pharmacist comments: Patient presents with wife in fair spirits. All medications, doses, and indications were reviewed with patient and his wife. Patient's wife seems to be in charge of medications for the patient and she has an excellent understanding of his regimen. Patient's understanding is also good. Patient's wife reports that they have not had any trouble obtaining medications but are having to switch the PAP they get his sensipar and fosrenol from. They have a one month supply of both to "get them through". Per patient - he was taking carvedilol but was instructed by his PCP to stop this for hypotension and only take when BP is elevated. Patient states he has not taken in ~3 months but has some at home. No further questions from patient or wife.    Carlean Jews, Pharm.D. PGY1 Pharmacy Resident 1/4/20188:44 AM Pager [phone]

## 2016-10-06 DIAGNOSIS — E119 Type 2 diabetes mellitus without complications: Secondary | ICD-10-CM | POA: Diagnosis not present

## 2016-10-06 DIAGNOSIS — D631 Anemia in chronic kidney disease: Secondary | ICD-10-CM | POA: Diagnosis not present

## 2016-10-06 DIAGNOSIS — N186 End stage renal disease: Secondary | ICD-10-CM | POA: Diagnosis not present

## 2016-10-06 DIAGNOSIS — N2581 Secondary hyperparathyroidism of renal origin: Secondary | ICD-10-CM | POA: Diagnosis not present

## 2016-10-08 ENCOUNTER — Encounter (HOSPITAL_COMMUNITY): Payer: Medicare Other

## 2016-10-08 ENCOUNTER — Encounter (HOSPITAL_COMMUNITY): Payer: Self-pay

## 2016-10-09 DIAGNOSIS — N186 End stage renal disease: Secondary | ICD-10-CM | POA: Diagnosis not present

## 2016-10-09 DIAGNOSIS — D631 Anemia in chronic kidney disease: Secondary | ICD-10-CM | POA: Diagnosis not present

## 2016-10-09 DIAGNOSIS — E119 Type 2 diabetes mellitus without complications: Secondary | ICD-10-CM | POA: Diagnosis not present

## 2016-10-09 DIAGNOSIS — N2581 Secondary hyperparathyroidism of renal origin: Secondary | ICD-10-CM | POA: Diagnosis not present

## 2016-10-10 ENCOUNTER — Encounter (HOSPITAL_COMMUNITY): Payer: Medicare Other

## 2016-10-11 DIAGNOSIS — N2581 Secondary hyperparathyroidism of renal origin: Secondary | ICD-10-CM | POA: Diagnosis not present

## 2016-10-11 DIAGNOSIS — D631 Anemia in chronic kidney disease: Secondary | ICD-10-CM | POA: Diagnosis not present

## 2016-10-11 DIAGNOSIS — N186 End stage renal disease: Secondary | ICD-10-CM | POA: Diagnosis not present

## 2016-10-11 DIAGNOSIS — E119 Type 2 diabetes mellitus without complications: Secondary | ICD-10-CM | POA: Diagnosis not present

## 2016-10-12 ENCOUNTER — Encounter (HOSPITAL_COMMUNITY): Payer: Medicare Other

## 2016-10-12 DIAGNOSIS — R531 Weakness: Secondary | ICD-10-CM | POA: Diagnosis not present

## 2016-10-12 DIAGNOSIS — K219 Gastro-esophageal reflux disease without esophagitis: Secondary | ICD-10-CM | POA: Diagnosis not present

## 2016-10-12 DIAGNOSIS — Z86010 Personal history of colonic polyps: Secondary | ICD-10-CM | POA: Diagnosis not present

## 2016-10-12 DIAGNOSIS — R195 Other fecal abnormalities: Secondary | ICD-10-CM | POA: Diagnosis not present

## 2016-10-12 DIAGNOSIS — R112 Nausea with vomiting, unspecified: Secondary | ICD-10-CM | POA: Diagnosis not present

## 2016-10-13 DIAGNOSIS — E119 Type 2 diabetes mellitus without complications: Secondary | ICD-10-CM | POA: Diagnosis not present

## 2016-10-13 DIAGNOSIS — N186 End stage renal disease: Secondary | ICD-10-CM | POA: Diagnosis not present

## 2016-10-13 DIAGNOSIS — D631 Anemia in chronic kidney disease: Secondary | ICD-10-CM | POA: Diagnosis not present

## 2016-10-13 DIAGNOSIS — N2581 Secondary hyperparathyroidism of renal origin: Secondary | ICD-10-CM | POA: Diagnosis not present

## 2016-10-15 ENCOUNTER — Encounter (HOSPITAL_COMMUNITY)
Admission: RE | Admit: 2016-10-15 | Discharge: 2016-10-15 | Disposition: A | Discharge status: Home / Self Care | Payer: Medicare Other | Source: Ambulatory Visit | Attending: Internal Medicine | Admitting: Internal Medicine

## 2016-10-15 ENCOUNTER — Encounter (HOSPITAL_COMMUNITY): Payer: Self-pay

## 2016-10-15 DIAGNOSIS — I214 Non-ST elevation (NSTEMI) myocardial infarction: Secondary | ICD-10-CM

## 2016-10-15 DIAGNOSIS — Z955 Presence of coronary angioplasty implant and graft: Secondary | ICD-10-CM

## 2016-10-15 LAB — GLUCOSE, CAPILLARY: GLUCOSE-CAPILLARY: 144 mg/dL — AB (ref 65–99)

## 2016-10-15 NOTE — Progress Notes (Signed)
Daily Session Note  Patient Details  Name: [name] MRN: [id number] Date of Birth: 12/30/1950 Referring Provider:   Flowsheet Row CARDIAC REHAB PHASE II ORIENTATION from 10/04/2016 in [facility]  Referring Provider  Bensimhon, Daniel MD      Encounter Date: 10/15/2016  Check In:     Session Check In - 10/15/16 1442      Check-In   Location [facility]   Staff Present Amber Fair, MS, ACSM RCEP, Exercise Physiologist;Joann Rion, RN, BSN;Maria Whitaker, RN, BSN;Olinty Richards, MS, ACSM CEP, Exercise Physiologist   Supervising physician immediately available to respond to emergencies [facility] immediately available   Physician(s) Dr Patel   Medication changes reported     No   Fall or balance concerns reported    No   Warm-up and Cool-down Performed as group-led instruction   Resistance Training Performed Yes   VAD Patient? No     Pain Assessment   Currently in Pain? Yes   Pain Score 3    Pain Location Knee   Pain Type Chronic pain   Multiple Pain Sites No      Capillary Blood Glucose: Results for orders placed or performed during the hospital encounter of 10/15/16 (from the past 24 hour(s))  Glucose, capillary     Status: Abnormal   Collection Time: 10/15/16  2:25 PM  Result Value Ref Range   Glucose-Capillary 144 (H) 65 - 99 mg/dL     Goals Met:  Exercise tolerated well  Goals Unmet:  Not Applicable  Comments: Pt started cardiac rehab today.  Pt tolerated light exercise without difficulty. VSS, telemetry-sinus tachycardia,  asymptomatic.  Medication list reconciled. Pt denies barriers to medicaiton compliance.  PSYCHOSOCIAL ASSESSMENT:  PHQ-6.  Pt has anxiety and depression from chronic illness.  Pt barriers to cardiac rehab include hemodialysis schedule and residual fatigue.  Pt has supportive family, wife accompanies him to rehab appointment.   Pt enjoys fishing and sports.   Pt oriented to exercise  equipment and routine.    Understanding verbalized.   Dr. Traci Turner is Medical Director for Cardiac Rehab at [facility]. 

## 2016-10-16 ENCOUNTER — Emergency Department (HOSPITAL_COMMUNITY): Payer: Medicare Other

## 2016-10-16 ENCOUNTER — Inpatient Hospital Stay (HOSPITAL_COMMUNITY)
Admission: EM | Admit: 2016-10-16 | Discharge: 2016-10-18 | DRG: 308 | Disposition: A | Discharge status: Home / Self Care | Payer: Medicare Other | Attending: Internal Medicine | Admitting: Internal Medicine

## 2016-10-16 ENCOUNTER — Encounter (HOSPITAL_COMMUNITY): Payer: Self-pay

## 2016-10-16 DIAGNOSIS — E1122 Type 2 diabetes mellitus with diabetic chronic kidney disease: Secondary | ICD-10-CM | POA: Diagnosis present

## 2016-10-16 DIAGNOSIS — E785 Hyperlipidemia, unspecified: Secondary | ICD-10-CM | POA: Diagnosis present

## 2016-10-16 DIAGNOSIS — N2581 Secondary hyperparathyroidism of renal origin: Secondary | ICD-10-CM | POA: Diagnosis present

## 2016-10-16 DIAGNOSIS — I252 Old myocardial infarction: Secondary | ICD-10-CM | POA: Diagnosis not present

## 2016-10-16 DIAGNOSIS — R51 Headache: Secondary | ICD-10-CM | POA: Diagnosis present

## 2016-10-16 DIAGNOSIS — I25118 Atherosclerotic heart disease of native coronary artery with other forms of angina pectoris: Secondary | ICD-10-CM | POA: Diagnosis not present

## 2016-10-16 DIAGNOSIS — I959 Hypotension, unspecified: Secondary | ICD-10-CM | POA: Diagnosis not present

## 2016-10-16 DIAGNOSIS — E872 Acidosis, unspecified: Secondary | ICD-10-CM

## 2016-10-16 DIAGNOSIS — I255 Ischemic cardiomyopathy: Secondary | ICD-10-CM | POA: Diagnosis present

## 2016-10-16 DIAGNOSIS — N186 End stage renal disease: Secondary | ICD-10-CM | POA: Diagnosis not present

## 2016-10-16 DIAGNOSIS — I214 Non-ST elevation (NSTEMI) myocardial infarction: Secondary | ICD-10-CM | POA: Diagnosis not present

## 2016-10-16 DIAGNOSIS — I9589 Other hypotension: Secondary | ICD-10-CM | POA: Diagnosis not present

## 2016-10-16 DIAGNOSIS — I471 Supraventricular tachycardia: Principal | ICD-10-CM | POA: Diagnosis present

## 2016-10-16 DIAGNOSIS — Z7982 Long term (current) use of aspirin: Secondary | ICD-10-CM

## 2016-10-16 DIAGNOSIS — G4489 Other headache syndrome: Secondary | ICD-10-CM | POA: Diagnosis not present

## 2016-10-16 DIAGNOSIS — I2510 Atherosclerotic heart disease of native coronary artery without angina pectoris: Secondary | ICD-10-CM | POA: Diagnosis present

## 2016-10-16 DIAGNOSIS — M542 Cervicalgia: Secondary | ICD-10-CM | POA: Diagnosis present

## 2016-10-16 DIAGNOSIS — D631 Anemia in chronic kidney disease: Secondary | ICD-10-CM | POA: Diagnosis present

## 2016-10-16 DIAGNOSIS — E1129 Type 2 diabetes mellitus with other diabetic kidney complication: Secondary | ICD-10-CM

## 2016-10-16 DIAGNOSIS — G4733 Obstructive sleep apnea (adult) (pediatric): Secondary | ICD-10-CM

## 2016-10-16 DIAGNOSIS — I5032 Chronic diastolic (congestive) heart failure: Secondary | ICD-10-CM | POA: Diagnosis present

## 2016-10-16 DIAGNOSIS — Z7984 Long term (current) use of oral hypoglycemic drugs: Secondary | ICD-10-CM

## 2016-10-16 DIAGNOSIS — D649 Anemia, unspecified: Secondary | ICD-10-CM

## 2016-10-16 DIAGNOSIS — R52 Pain, unspecified: Secondary | ICD-10-CM | POA: Diagnosis not present

## 2016-10-16 DIAGNOSIS — Z7902 Long term (current) use of antithrombotics/antiplatelets: Secondary | ICD-10-CM

## 2016-10-16 DIAGNOSIS — Z992 Dependence on renal dialysis: Secondary | ICD-10-CM

## 2016-10-16 DIAGNOSIS — Z8679 Personal history of other diseases of the circulatory system: Secondary | ICD-10-CM | POA: Diagnosis present

## 2016-10-16 DIAGNOSIS — R079 Chest pain, unspecified: Secondary | ICD-10-CM | POA: Diagnosis not present

## 2016-10-16 DIAGNOSIS — R519 Headache, unspecified: Secondary | ICD-10-CM | POA: Diagnosis present

## 2016-10-16 DIAGNOSIS — R195 Other fecal abnormalities: Secondary | ICD-10-CM | POA: Diagnosis present

## 2016-10-16 DIAGNOSIS — Z9861 Coronary angioplasty status: Secondary | ICD-10-CM

## 2016-10-16 DIAGNOSIS — Z79899 Other long term (current) drug therapy: Secondary | ICD-10-CM

## 2016-10-16 DIAGNOSIS — I953 Hypotension of hemodialysis: Secondary | ICD-10-CM | POA: Diagnosis present

## 2016-10-16 DIAGNOSIS — F0390 Unspecified dementia without behavioral disturbance: Secondary | ICD-10-CM

## 2016-10-16 DIAGNOSIS — R778 Other specified abnormalities of plasma proteins: Secondary | ICD-10-CM | POA: Diagnosis present

## 2016-10-16 DIAGNOSIS — Z955 Presence of coronary angioplasty implant and graft: Secondary | ICD-10-CM | POA: Diagnosis not present

## 2016-10-16 DIAGNOSIS — Z833 Family history of diabetes mellitus: Secondary | ICD-10-CM

## 2016-10-16 DIAGNOSIS — Z87891 Personal history of nicotine dependence: Secondary | ICD-10-CM

## 2016-10-16 DIAGNOSIS — R9431 Abnormal electrocardiogram [ECG] [EKG]: Secondary | ICD-10-CM | POA: Diagnosis not present

## 2016-10-16 DIAGNOSIS — Z8546 Personal history of malignant neoplasm of prostate: Secondary | ICD-10-CM

## 2016-10-16 DIAGNOSIS — Z8249 Family history of ischemic heart disease and other diseases of the circulatory system: Secondary | ICD-10-CM

## 2016-10-16 DIAGNOSIS — Z841 Family history of disorders of kidney and ureter: Secondary | ICD-10-CM

## 2016-10-16 DIAGNOSIS — I132 Hypertensive heart and chronic kidney disease with heart failure and with stage 5 chronic kidney disease, or end stage renal disease: Secondary | ICD-10-CM | POA: Diagnosis present

## 2016-10-16 DIAGNOSIS — Z96651 Presence of right artificial knee joint: Secondary | ICD-10-CM | POA: Diagnosis present

## 2016-10-16 DIAGNOSIS — M109 Gout, unspecified: Secondary | ICD-10-CM | POA: Diagnosis present

## 2016-10-16 DIAGNOSIS — E119 Type 2 diabetes mellitus without complications: Secondary | ICD-10-CM | POA: Diagnosis not present

## 2016-10-16 DIAGNOSIS — I6503 Occlusion and stenosis of bilateral vertebral arteries: Secondary | ICD-10-CM | POA: Diagnosis not present

## 2016-10-16 DIAGNOSIS — H538 Other visual disturbances: Secondary | ICD-10-CM | POA: Diagnosis present

## 2016-10-16 LAB — CBC
HEMATOCRIT: 29.4 % — AB (ref 39.0–52.0)
Hemoglobin: 9.1 g/dL — ABNORMAL LOW (ref 13.0–17.0)
MCH: 30.8 pg (ref 26.0–34.0)
MCHC: 31 g/dL (ref 30.0–36.0)
MCV: 99.7 fL (ref 78.0–100.0)
PLATELETS: 257 10*3/uL (ref 150–400)
RBC: 2.95 MIL/uL — AB (ref 4.22–5.81)
RDW: 19.4 % — ABNORMAL HIGH (ref 11.5–15.5)
WBC: 8.6 10*3/uL (ref 4.0–10.5)

## 2016-10-16 LAB — CBC WITH DIFFERENTIAL/PLATELET
Basophils Absolute: 0 10*3/uL (ref 0.0–0.1)
Basophils Relative: 0 %
EOS PCT: 1 %
Eosinophils Absolute: 0.1 10*3/uL (ref 0.0–0.7)
HCT: 33.7 % — ABNORMAL LOW (ref 39.0–52.0)
Hemoglobin: 10.3 g/dL — ABNORMAL LOW (ref 13.0–17.0)
LYMPHS PCT: 24 %
Lymphs Abs: 2.4 10*3/uL (ref 0.7–4.0)
MCH: 30.5 pg (ref 26.0–34.0)
MCHC: 30.6 g/dL (ref 30.0–36.0)
MCV: 99.7 fL (ref 78.0–100.0)
MONO ABS: 1.1 10*3/uL — AB (ref 0.1–1.0)
MONOS PCT: 10 %
NEUTROS ABS: 6.5 10*3/uL (ref 1.7–7.7)
Neutrophils Relative %: 65 %
Platelets: 267 10*3/uL (ref 150–400)
RBC: 3.38 MIL/uL — ABNORMAL LOW (ref 4.22–5.81)
RDW: 19.4 % — AB (ref 11.5–15.5)
WBC: 10.1 10*3/uL (ref 4.0–10.5)

## 2016-10-16 LAB — I-STAT CHEM 8, ED
BUN: 28 mg/dL — ABNORMAL HIGH (ref 6–20)
CREATININE: 6.8 mg/dL — AB (ref 0.61–1.24)
Calcium, Ion: 0.93 mmol/L — ABNORMAL LOW (ref 1.15–1.40)
Chloride: 99 mmol/L — ABNORMAL LOW (ref 101–111)
GLUCOSE: 170 mg/dL — AB (ref 65–99)
HCT: 30 % — ABNORMAL LOW (ref 39.0–52.0)
HEMOGLOBIN: 10.2 g/dL — AB (ref 13.0–17.0)
Potassium: 4.3 mmol/L (ref 3.5–5.1)
Sodium: 137 mmol/L (ref 135–145)
TCO2: 29 mmol/L (ref 0–100)

## 2016-10-16 LAB — I-STAT VENOUS BLOOD GAS, ED
Acid-Base Excess: 4 mmol/L — ABNORMAL HIGH (ref 0.0–2.0)
BICARBONATE: 30 mmol/L — AB (ref 20.0–28.0)
O2 SAT: 79 %
TCO2: 32 mmol/L (ref 0–100)
pCO2, Ven: 50.9 mmHg (ref 44.0–60.0)
pH, Ven: 7.378 (ref 7.250–7.430)
pO2, Ven: 45 mmHg (ref 32.0–45.0)

## 2016-10-16 LAB — COMPREHENSIVE METABOLIC PANEL
ALBUMIN: 3.5 g/dL (ref 3.5–5.0)
ALK PHOS: 101 U/L (ref 38–126)
ALT: 26 U/L (ref 17–63)
ANION GAP: 17 — AB (ref 5–15)
AST: 37 U/L (ref 15–41)
BUN: 20 mg/dL (ref 6–20)
CALCIUM: 8.4 mg/dL — AB (ref 8.9–10.3)
CHLORIDE: 97 mmol/L — AB (ref 101–111)
CO2: 22 mmol/L (ref 22–32)
Creatinine, Ser: 6.61 mg/dL — ABNORMAL HIGH (ref 0.61–1.24)
GFR calc Af Amer: 9 mL/min — ABNORMAL LOW (ref >60)
GFR calc non Af Amer: 8 mL/min — ABNORMAL LOW (ref >60)
GLUCOSE: 166 mg/dL — AB (ref 65–99)
Potassium: 4.3 mmol/L (ref 3.5–5.1)
SODIUM: 136 mmol/L (ref 135–145)
Total Bilirubin: 0.6 mg/dL (ref 0.3–1.2)
Total Protein: 7.2 g/dL (ref 6.5–8.1)

## 2016-10-16 LAB — TROPONIN I: TROPONIN I: 0.98 ng/mL — AB (ref <0.03)

## 2016-10-16 LAB — MAGNESIUM: Magnesium: 2 mg/dL (ref 1.7–2.4)

## 2016-10-16 LAB — I-STAT TROPONIN, ED: Troponin i, poc: 0.03 ng/mL (ref 0.00–0.08)

## 2016-10-16 LAB — I-STAT CG4 LACTIC ACID, ED: LACTIC ACID, VENOUS: 3.2 mmol/L — AB (ref 0.5–1.9)

## 2016-10-16 LAB — PROTIME-INR
INR: 0.88
Prothrombin Time: 11.9 seconds (ref 11.4–15.2)

## 2016-10-16 LAB — TSH: TSH: 0.141 u[IU]/mL — AB (ref 0.350–4.500)

## 2016-10-16 LAB — GLUCOSE, CAPILLARY: GLUCOSE-CAPILLARY: 158 mg/dL — AB (ref 65–99)

## 2016-10-16 LAB — SEDIMENTATION RATE: Sed Rate: 128 mm/hr — ABNORMAL HIGH (ref 0–16)

## 2016-10-16 MED ORDER — GABAPENTIN 300 MG PO CAPS
600.0000 mg | ORAL_CAPSULE | Freq: Every day | ORAL | Status: DC
  Administered 2016-10-17 (×2): 600 mg via ORAL
  Filled 2016-10-16 (×2): qty 2

## 2016-10-16 MED ORDER — DIPHENHYDRAMINE HCL 50 MG/ML IJ SOLN
25.0000 mg | Freq: Once | INTRAMUSCULAR | Status: AC
  Administered 2016-10-16: 25 mg via INTRAVENOUS
  Filled 2016-10-16: qty 1

## 2016-10-16 MED ORDER — COLESEVELAM HCL 625 MG PO TABS
1250.0000 mg | ORAL_TABLET | Freq: Two times a day (BID) | ORAL | Status: DC
  Administered 2016-10-17 – 2016-10-18 (×5): 1250 mg via ORAL
  Filled 2016-10-16 (×5): qty 2

## 2016-10-16 MED ORDER — REPAGLINIDE 0.5 MG PO TABS
0.5000 mg | ORAL_TABLET | Freq: Every day | ORAL | Status: DC
  Filled 2016-10-16: qty 1

## 2016-10-16 MED ORDER — AMIODARONE HCL IN DEXTROSE 360-4.14 MG/200ML-% IV SOLN
30.0000 mg/h | INTRAVENOUS | Status: DC
  Administered 2016-10-17 (×2): 30 mg/h via INTRAVENOUS
  Filled 2016-10-16 (×2): qty 200

## 2016-10-16 MED ORDER — AMIODARONE IV BOLUS ONLY 150 MG/100ML
150.0000 mg | Freq: Once | INTRAVENOUS | Status: DC
  Filled 2016-10-16: qty 100

## 2016-10-16 MED ORDER — AMIODARONE HCL IN DEXTROSE 360-4.14 MG/200ML-% IV SOLN
60.0000 mg/h | INTRAVENOUS | Status: AC
  Administered 2016-10-16: 60 mg/h via INTRAVENOUS
  Filled 2016-10-16: qty 200

## 2016-10-16 MED ORDER — ASPIRIN EC 81 MG PO TBEC
81.0000 mg | DELAYED_RELEASE_TABLET | Freq: Every day | ORAL | Status: DC
  Administered 2016-10-17 – 2016-10-18 (×2): 81 mg via ORAL
  Filled 2016-10-16 (×2): qty 1

## 2016-10-16 MED ORDER — RENA-VITE PO TABS
1.0000 | ORAL_TABLET | Freq: Every day | ORAL | Status: DC
  Administered 2016-10-17 (×2): 1 via ORAL
  Filled 2016-10-16 (×3): qty 1

## 2016-10-16 MED ORDER — DILTIAZEM HCL 25 MG/5ML IV SOLN: 10.0000 mg | Freq: Once | INTRAVENOUS | Status: DC

## 2016-10-16 MED ORDER — ALLOPURINOL 100 MG PO TABS
100.0000 mg | ORAL_TABLET | Freq: Every day | ORAL | Status: DC
  Administered 2016-10-17 – 2016-10-18 (×2): 100 mg via ORAL
  Filled 2016-10-16 (×2): qty 1

## 2016-10-16 MED ORDER — CINACALCET HCL 30 MG PO TABS
90.0000 mg | ORAL_TABLET | Freq: Two times a day (BID) | ORAL | Status: DC
  Administered 2016-10-17 – 2016-10-18 (×4): 90 mg via ORAL
  Filled 2016-10-16 (×4): qty 3

## 2016-10-16 MED ORDER — AMIODARONE LOAD VIA INFUSION
150.0000 mg | Freq: Once | INTRAVENOUS | Status: AC
Start: 2016-10-16 — End: 2016-10-16
  Administered 2016-10-16: 150 mg via INTRAVENOUS
  Filled 2016-10-16: qty 83.34

## 2016-10-16 MED ORDER — FENTANYL CITRATE (PF) 100 MCG/2ML IJ SOLN
25.0000 ug | Freq: Once | INTRAMUSCULAR | Status: AC
  Administered 2016-10-16: 25 ug via INTRAVENOUS
  Filled 2016-10-16: qty 2

## 2016-10-16 MED ORDER — CLOPIDOGREL BISULFATE 75 MG PO TABS
75.0000 mg | ORAL_TABLET | Freq: Every day | ORAL | Status: DC
  Administered 2016-10-17 – 2016-10-18 (×2): 75 mg via ORAL
  Filled 2016-10-16 (×2): qty 1

## 2016-10-16 MED ORDER — PANTOPRAZOLE SODIUM 40 MG PO TBEC
40.0000 mg | DELAYED_RELEASE_TABLET | Freq: Two times a day (BID) | ORAL | Status: DC
  Administered 2016-10-17 – 2016-10-18 (×4): 40 mg via ORAL
  Filled 2016-10-16 (×4): qty 1

## 2016-10-16 MED ORDER — METOCLOPRAMIDE HCL 5 MG/ML IJ SOLN
10.0000 mg | Freq: Once | INTRAMUSCULAR | Status: AC
  Administered 2016-10-16: 10 mg via INTRAVENOUS
  Filled 2016-10-16: qty 2

## 2016-10-16 MED ORDER — ONDANSETRON HCL 4 MG/2ML IJ SOLN: 4.0000 mg | Freq: Four times a day (QID) | INTRAMUSCULAR | Status: DC | PRN

## 2016-10-16 MED ORDER — MIDODRINE HCL 5 MG PO TABS
10.0000 mg | ORAL_TABLET | ORAL | Status: DC
  Administered 2016-10-18: 10 mg via ORAL

## 2016-10-16 MED ORDER — CITALOPRAM HYDROBROMIDE 20 MG PO TABS
10.0000 mg | ORAL_TABLET | ORAL | Status: DC
  Administered 2016-10-17: 10 mg via ORAL
  Filled 2016-10-16: qty 1

## 2016-10-16 MED ORDER — SODIUM CHLORIDE 0.9 % IV BOLUS (SEPSIS)
1000.0000 mL | Freq: Once | INTRAVENOUS | Status: AC
  Administered 2016-10-16: 1000 mL via INTRAVENOUS

## 2016-10-16 MED ORDER — INSULIN ASPART 100 UNIT/ML ~~LOC~~ SOLN: 0.0000 [IU] | Freq: Three times a day (TID) | SUBCUTANEOUS | Status: DC

## 2016-10-16 MED ORDER — EZETIMIBE 10 MG PO TABS
10.0000 mg | ORAL_TABLET | Freq: Every day | ORAL | Status: DC
  Administered 2016-10-17 (×2): 10 mg via ORAL
  Filled 2016-10-16 (×3): qty 1

## 2016-10-16 MED ORDER — ONDANSETRON HCL 4 MG PO TABS
4.0000 mg | ORAL_TABLET | Freq: Four times a day (QID) | ORAL | Status: DC | PRN
Start: 2016-10-16 — End: 2016-10-18

## 2016-10-16 MED ORDER — ACETAMINOPHEN 325 MG PO TABS: 650.0000 mg | ORAL_TABLET | Freq: Four times a day (QID) | ORAL | Status: DC | PRN

## 2016-10-16 MED ORDER — LANTHANUM CARBONATE 500 MG PO CHEW
2000.0000 mg | CHEWABLE_TABLET | Freq: Two times a day (BID) | ORAL | Status: DC
  Administered 2016-10-17 – 2016-10-18 (×4): 2000 mg via ORAL
  Filled 2016-10-16 (×4): qty 4

## 2016-10-16 MED ORDER — HEPARIN SODIUM (PORCINE) 5000 UNIT/ML IJ SOLN
5000.0000 [IU] | Freq: Three times a day (TID) | INTRAMUSCULAR | Status: DC
  Administered 2016-10-17: 5000 [IU] via SUBCUTANEOUS
  Filled 2016-10-16: qty 1

## 2016-10-16 MED ORDER — ACETAMINOPHEN 650 MG RE SUPP: 650.0000 mg | Freq: Four times a day (QID) | RECTAL | Status: DC | PRN

## 2016-10-16 MED ORDER — KETOROLAC TROMETHAMINE 30 MG/ML IJ SOLN
30.0000 mg | Freq: Once | INTRAMUSCULAR | Status: AC
  Administered 2016-10-16: 30 mg via INTRAVENOUS
  Filled 2016-10-16: qty 1

## 2016-10-16 NOTE — ED Provider Notes (Signed)
[name] DEPT Provider Note   CSN: [id number] Arrival date & time: 10/16/16  1629     History   Chief Complaint Chief Complaint  Patient presents with  . Headache    HPI [name] is a 66 y.o. male.  HPI 66 year old male with extensive past medical history as below who presents with headache and multiple complaints. Patient is somewhat limited providing history due to pain but states that his symptoms started approximately 3 days ago as a generalized severe headache. The headache is throbbing, aching, and worse with position changes. He went to dialysis today and was noted to be hypotensive and tachycardic with worsening headache. He subsequent presents for evaluation. Patient's wife states the patient has been generally unwell and felt sick for the last several days. He has not missed or change in his medications. He is at his baseline weight according to his wife. He denies any recent head trauma. Of note, he does take aspirin as well as Plavix.    Past Medical History:  Diagnosis Date  . Allergic rhinitis, cause unspecified 02/24/2014  . Anemia 06/16/2011  . BENIGN PROSTATIC HYPERTROPHY 10/14/2009  . CAD, NATIVE VESSEL 02/06/2009   saw Dr. Missy Sabins last jan 2013  . Cervical radiculopathy, chronic 02/23/2016   Right c5-6 by NCS/EMG  . CHEST PAIN 03/29/2010  . COLONIC POLYPS, HX OF 10/14/2009  . CONGESTIVE HEART FAILURE 06/18/2007  . Dementia [id number]  . DEPRESSION 10/14/2009  . Depression 09/24/2015  . DIABETES MELLITUS, TYPE II 02/01/2010  . DIASTOLIC HEART FAILURE, CHRONIC 02/06/2009  . DIZZINESS 07/17/2010  . DYSLIPIDEMIA 06/18/2007  . DYSPNEA 10/29/2008  . ESRD (end stage renal disease) on dialysis ([place]) 08/04/2010   "TTS;  " (04/18/2015)  . FOOT PAIN 08/12/2008  . GAIT DISTURBANCE 03/03/2010  . GASTROENTERITIS, VIRAL 10/14/2009  . GERD 06/18/2007  . GOITER, MULTINODULAR 12/26/2007  . GOUT 06/18/2007  . GYNECOMASTIA 07/17/2010  . Hemodialysis access, fistula mature [facility])      Dialysis T-Th-Sa ([place]) Right upper arm fistula  . Hyperlipidemia 10/16/2011  . Hyperparathyroidism, secondary ([place]) 06/16/2011  . HYPERTENSION 06/18/2007  . Hyperthyroidism   . HYPERTHYROIDISM 02/02/2010  . Hypocalcemia 06/07/2010  . Ischemic cardiomyopathy 06/16/2011  . NECK PAIN 07/31/2010  . NSTEMI (non-ST elevated myocardial infarction) ([place]) 08/04/2016  . ONYCHOMYCOSIS, TOENAILS 12/26/2007  . OSA on CPAP 10/16/2011  . Other malaise and fatigue 11/24/2009  . PERIPHERAL NEUROPATHY 06/18/2007  . Prostate cancer ([place])   . PULMONARY NODULE, RIGHT LOWER LOBE 06/08/2009  . Renal insufficiency   . Sleep apnea    cpap machine and o2  . TRANSAMINASES, SERUM, ELEVATED 02/01/2010  . Transfusion history    none recent  . Unspecified hypotension 01/30/2010    Patient Active Problem List   Diagnosis Date Noted  . SVT (supraventricular tachycardia) ([place]) 10/16/2016  . Abdominal pain 08/27/2016  . Abnormal findings on diagnostic imaging of liver and biliary tract 08/27/2016  . Bloating symptom 08/27/2016  . Hematochezia 08/27/2016  . Early satiety 08/27/2016  . Eructation 08/27/2016  . Esophageal ulcer 08/27/2016  . Flatulence, eructation and gas pain 08/27/2016  . General weakness 08/27/2016  . Skin sensation disturbance 08/27/2016  . Stricture of esophagus 08/27/2016  . Alternating constipation and diarrhea 08/16/2016  . Status post coronary artery stent placement   . Abnormal EKG   . ST segment depression 08/05/2016  . PAD (peripheral artery disease) ([place]) 06/18/2016  . Transient hypotension 04/29/2016  . Cervical radiculopathy, chronic 02/23/2016  .  Memory loss 12/22/2015  . Left-sided weakness 12/22/2015  . Peripheral polyneuropathy ([place]) 12/22/2015  . DM (diabetes mellitus) type II controlled with renal manifestation ([place]) 10/31/2015  . Nausea and vomiting 10/31/2015  . Malignant neoplasm of prostate ([place]) 10/24/2015  . Right leg pain 09/04/2015  . Chest pain  with high risk for cardiac etiology 08/09/2015  . Acute encephalopathy 08/09/2015  . S/P right total knee replacement 08/09/2015  . Primary osteoarthritis of right knee 08/05/2015  . Neuropathy due to secondary diabetes mellitus ([place]) 08/05/2015  . Arthritis of knee 08/02/2015  . Headache 06/08/2015  . Melena 04/19/2015  . Acute blood loss anemia 04/18/2015  . Dialysis patient ([place]) 04/18/2015  . Hyperthyroidism 03/11/2015  . Thigh pain 12/08/2014  . Nodule of right lung 09/14/2014  . PSA elevation 08/30/2014  . ESRD on hemodialysis ([place]) 08/16/2014  . Essential hypertension 08/16/2014  . Pre-transplant evaluation for kidney transplant 08/16/2014  . Allergic rhinitis 02/24/2014  . Elevated PSA 02/24/2014  . Vertigo 10/14/2013  . Cough 06/26/2013  . Numbness 05/15/2013  . ESRD (end stage renal disease) ([place]) 04/06/2013  . Chronic systolic heart failure ([place]) 02/09/2013  . Other complications due to renal dialysis device, implant, and graft 01/14/2013  . Right hand pain 03/28/2012  . Dysphagia 03/28/2012  . Hypogonadism, male 03/28/2012  . OSA (obstructive sleep apnea) 10/16/2011  . Hyperlipidemia 10/16/2011  . CAD (coronary artery disease) 09/28/2011  . NSTEMI (non-ST elevated myocardial infarction) ([place]) 09/28/2011  . AVM (arteriovenous malformation) 09/27/2011  . Anemia associated with acute blood loss 09/26/2011  . Anemia 06/16/2011  . Ischemic cardiomyopathy 06/16/2011  . Hyperparathyroidism, secondary ([place]) 06/16/2011  . Preventative health care 03/11/2011  . NECK PAIN 07/31/2010  . GYNECOMASTIA 07/17/2010  . DIZZINESS 07/17/2010  . GAIT DISTURBANCE 03/03/2010  . Thyrotoxicosis 02/02/2010  . Elevated levels of transaminase & lactic acid dehydrogenase 02/01/2010  . Other malaise and fatigue 11/24/2009  . Depression 10/14/2009  . BENIGN PROSTATIC HYPERTROPHY 10/14/2009  . COLONIC POLYPS, HX OF 10/14/2009  . Dementia 09/02/2009  . PULMONARY NODULE, RIGHT LOWER LOBE  06/08/2009  . Chronic diastolic congestive heart failure ([place]) 02/06/2009  . DYSPNEA 10/29/2008  . FOOT PAIN 08/12/2008  . ONYCHOMYCOSIS, TOENAILS 12/26/2007  . GOITER, MULTINODULAR 12/26/2007  . GOUT 06/18/2007  . Neuropathy ([place]) 06/18/2007  . Hypertensive heart disease with CHF (congestive heart failure) ([place]) 06/18/2007  . Gastro-esophageal reflux disease without esophagitis 06/18/2007    Past Surgical History:  Procedure Laterality Date  . ARTERIOVENOUS GRAFT PLACEMENT Right 2009   forearm/notes 02/01/2011  . AV FISTULA PLACEMENT  11/07/2011   Procedure: INSERTION OF ARTERIOVENOUS (AV) GORE-TEX GRAFT ARM;  Surgeon: Tinnie Gens, MD;  Location: [place];  Service: Vascular;  Laterality: Left;  . BACK SURGERY  1998  . BASCILIC VEIN TRANSPOSITION Right 02/27/2013   Procedure: BASCILIC VEIN TRANSPOSITION;  Surgeon: Mal Misty, MD;  Location: [place];  Service: Vascular;  Laterality: Right;  Right Basilic Vein Transposition   . CARDIAC CATHETERIZATION N/A 08/06/2016   Procedure: Left Heart Cath and Coronary Angiography;  Surgeon: Jolaine Artist, MD;  Location: [place] CV LAB;  Service: Cardiovascular;  Laterality: N/A;  . CARDIAC CATHETERIZATION N/A 08/07/2016   Procedure: Coronary/Graft Atherectomy-CSI LAD;  Surgeon: Peter M Martinique, MD;  Location: [place] CV LAB;  Service: Cardiovascular;  Laterality: N/A;  . CERVICAL SPINE SURGERY  2/09   "to repair nerve problems in my left arm"  . CHOLECYSTECTOMY    . CORONARY ANGIOPLASTY WITH STENT  PLACEMENT  06/11/2008  . CORONARY ANGIOPLASTY WITH STENT PLACEMENT  06/2007   TAXUS stent to RCA/notes 01/31/2011  . ESOPHAGOGASTRODUODENOSCOPY  09/28/2011   Procedure: ESOPHAGOGASTRODUODENOSCOPY (EGD);  Surgeon: Missy Sabins, MD;  Location: [facility] ENDOSCOPY;  Service: Endoscopy;  Laterality: N/A;  . ESOPHAGOGASTRODUODENOSCOPY N/A 04/07/2015   Procedure: ESOPHAGOGASTRODUODENOSCOPY (EGD);  Surgeon: Teena Irani, MD;  Location: [facility];  Service:  Endoscopy;  Laterality: N/A;  . ESOPHAGOGASTRODUODENOSCOPY N/A 04/19/2015   Procedure: ESOPHAGOGASTRODUODENOSCOPY (EGD);  Surgeon: Arta Silence, MD;  Location: [facility] ENDOSCOPY;  Service: Endoscopy;  Laterality: N/A;  . FOREIGN BODY REMOVAL  09/2003   via upper endoscopy/notes 02/12/2011  . GIVENS CAPSULE STUDY  09/30/2011   Procedure: GIVENS CAPSULE STUDY;  Surgeon: Jeryl Columbia, MD;  Location: [facility] ENDOSCOPY;  Service: Endoscopy;  Laterality: N/A;  . INSERTION OF DIALYSIS CATHETER Right 2014  . INSERTION OF DIALYSIS CATHETER Left 02/11/2013   Procedure: INSERTION OF DIALYSIS CATHETER;  Surgeon: Conrad Bell Hill, MD;  Location: [place];  Service: Vascular;  Laterality: Left;  Ultrasound guided  . REMOVAL OF A DIALYSIS CATHETER Right 02/11/2013   Procedure: REMOVAL OF A DIALYSIS CATHETER;  Surgeon: Conrad Washburn, MD;  Location: [place];  Service: Vascular;  Laterality: Right;  . SAVORY DILATION N/A 04/07/2015   Procedure: SAVORY DILATION;  Surgeon: Teena Irani, MD;  Location: [facility] ENDOSCOPY;  Service: Endoscopy;  Laterality: N/A;  . SHUNTOGRAM N/A 09/20/2011   Procedure: Earney Mallet;  Surgeon: Conrad Meggett, MD;  Location: [facility] CATH LAB;  Service: Cardiovascular;  Laterality: N/A;  . TONSILLECTOMY    . TOTAL KNEE ARTHROPLASTY Right 08/02/2015   Procedure: TOTAL KNEE ARTHROPLASTY;  Surgeon: Renette Butters, MD;  Location: [place];  Service: Orthopedics;  Laterality: Right;  . VENOGRAM N/A 01/26/2013   Procedure: VENOGRAM;  Surgeon: Angelia Mould, MD;  Location: [facility] CATH LAB;  Service: Cardiovascular;  Laterality: N/A;       Home Medications    Prior to Admission medications   Medication Sig Start Date End Date Taking? Authorizing Provider  acetaminophen (TYLENOL) 325 MG tablet Take 650 mg by mouth every 6 (six) hours as needed for mild pain or headache.    Yes Historical Provider, MD  allopurinol (ZYLOPRIM) 100 MG tablet Take 1 tablet (100 mg total) by mouth daily. 07/20/16  Yes Biagio Borg, MD  aspirin 81  MG tablet Take 81 mg by mouth daily.   Yes Historical Provider, MD  citalopram (CELEXA) 10 MG tablet Take 10 mg by mouth 3 (three) times a week.   Yes Historical Provider, MD  clopidogrel (PLAVIX) 75 MG tablet Take 1 tablet (75 mg total) by mouth daily with breakfast. 09/05/16  Yes Jolaine Artist, MD  colesevelam (WELCHOL) 625 MG tablet TAKE THREE TABLETS BY MOUTH TWICE DAILY WITH MEALS Patient taking differently: Take 1,250 mg by mouth 2 (two) times daily with a meal.  07/20/16  Yes Biagio Borg, MD  ezetimibe (ZETIA) 10 MG tablet Take 1 tablet (10 mg total) by mouth daily. Patient taking differently: Take 10 mg by mouth at bedtime.  03/15/16  Yes Jolaine Artist, MD  gabapentin (NEURONTIN) 300 MG capsule 1-2 tabs by mouth at bedtime for sleep and pain Patient taking differently: Take 600 mg by mouth at bedtime.  07/20/16  Yes Biagio Borg, MD  Lanthanum Carbonate 1000 MG PACK Take 2 packets by mouth 2 (two) times daily.   Yes Historical Provider, MD  midodrine (PROAMATINE) 10 MG tablet Take 10  mg by mouth See admin instructions. Once a day only on dialysis days (Tues/Thurs/Sat) 08/27/16  Yes Historical Provider, MD  multivitamin (RENA-VIT) TABS tablet Take 1 tablet by mouth at bedtime. Reported on 10/24/2015   Yes Historical Provider, MD  pantoprazole (PROTONIX) 40 MG tablet Take 40 mg by mouth 2 (two) times daily.  08/31/16  Yes Historical Provider, MD  repaglinide (PRANDIN) 0.5 MG tablet Take 1 tablet (0.5 mg total) by mouth daily with breakfast. 08/15/16  Yes Biagio Borg, MD  SENSIPAR 90 MG tablet Take 90 mg by mouth 2 (two) times daily. 09/18/16  Yes Historical Provider, MD    Family History Family History  Problem Relation Age of Onset  . Heart disease Sister   . Thyroid nodules Sister   . Heart disease Father   . Diabetes Father   . Kidney failure Father   . Hypertension Father   . Healthy Child   . Healthy Child   . Healthy Child   . Cancer Neg Hx     Social  History Social History  Substance Use Topics  . Smoking status: Former Smoker    Packs/day: 1.00    Years: 25.00    Types: Cigarettes    Quit date: 10/01/2005  . Smokeless tobacco: Former Systems developer    Quit date: 10/01/2005     Comment: Quit smoking 2007 Smoked x 25 years 1/2 ppd.  . Alcohol use No     Allergies   Cephalexin; Statins; and Ciprofloxacin   Review of Systems Review of Systems  Constitutional: Positive for chills and fatigue. Negative for fever.  HENT: Negative for congestion and rhinorrhea.   Eyes: Negative for visual disturbance.  Respiratory: Negative for cough, shortness of breath and wheezing.   Cardiovascular: Negative for chest pain and leg swelling.  Gastrointestinal: Negative for abdominal pain, diarrhea, nausea and vomiting.  Genitourinary: Negative for dysuria and flank pain.  Musculoskeletal: Negative for neck pain and neck stiffness.  Skin: Negative for rash and wound.  Allergic/Immunologic: Negative for immunocompromised state.  Neurological: Positive for weakness and headaches. Negative for syncope.  All other systems reviewed and are negative.    Physical Exam Updated Vital Signs BP 116/60   Pulse 82   Temp 97.9 F (36.6 C) (Oral)   Resp 19   SpO2 99%   Physical Exam  Constitutional: He is oriented to person, place, and time. He appears well-developed and well-nourished. He appears distressed.  HENT:  Head: Normocephalic and atraumatic.  Mouth/Throat: Oropharynx is clear and moist. No oropharyngeal exudate.  Eyes: Conjunctivae are normal.  Neck: Neck supple.  Cardiovascular: Regular rhythm and normal heart sounds.  Tachycardia present.  Exam reveals no friction rub.   No murmur heard. Pulmonary/Chest: Effort normal and breath sounds normal. No respiratory distress. He has no wheezes. He has no rales.  Abdominal: He exhibits no distension.  Musculoskeletal: He exhibits no edema.  Neurological: He is alert and oriented to person, place, and  time. He exhibits normal muscle tone.  Skin: Skin is warm. Capillary refill takes less than 2 seconds.  Psychiatric: He has a normal mood and affect.  Nursing note and vitals reviewed.   Neurological Exam:  Mental Status: Alert and oriented to person, place, and time. Attention and concentration normal. Speech clear. Recent memory is intact. Cranial Nerves: Visual fields grossly intact. EOMI and PERRLA. No nystagmus noted. Facial sensation intact at forehead, maxillary cheek, and chin/mandible bilaterally. No facial asymmetry or weakness. Hearing grossly normal. Uvula is midline, and  palate elevates symmetrically. Normal SCM and trapezius strength. Tongue midline without fasciculations. Motor: Muscle strength 5/5 in proximal and distal UE and LE bilaterally. No pronator drift. Muscle tone normal. Reflexes: 2+ and symmetrical in all four extremities.  Sensation: Intact to light touch in upper and lower extremities distally bilaterally.  Gait: Normal without ataxia. Coordination: Normal FTN bilaterally.    ED Treatments / Results  Labs (all labs ordered are listed, but only abnormal results are displayed) Labs Reviewed  CBC WITH DIFFERENTIAL/PLATELET - Abnormal; Notable for the following:       Result Value   RBC 3.38 (*)    Hemoglobin 10.3 (*)    HCT 33.7 (*)    RDW 19.4 (*)    Monocytes Absolute 1.1 (*)    All other components within normal limits  COMPREHENSIVE METABOLIC PANEL - Abnormal; Notable for the following:    Chloride 97 (*)    Glucose, Bld 166 (*)    Creatinine, Ser 6.61 (*)    Calcium 8.4 (*)    GFR calc non Af Amer 8 (*)    GFR calc Af Amer 9 (*)    Anion gap 17 (*)    All other components within normal limits  CBC - Abnormal; Notable for the following:    RBC 2.95 (*)    Hemoglobin 9.1 (*)    HCT 29.4 (*)    RDW 19.4 (*)    All other components within normal limits  I-STAT CHEM 8, ED - Abnormal; Notable for the following:    Chloride 99 (*)    BUN 28 (*)     Creatinine, Ser 6.80 (*)    Glucose, Bld 170 (*)    Calcium, Ion 0.93 (*)    Hemoglobin 10.2 (*)    HCT 30.0 (*)    All other components within normal limits  I-STAT CG4 LACTIC ACID, ED - Abnormal; Notable for the following:    Lactic Acid, Venous 3.20 (*)    All other components within normal limits  I-STAT VENOUS BLOOD GAS, ED - Abnormal; Notable for the following:    Bicarbonate 30.0 (*)    Acid-Base Excess 4.0 (*)    All other components within normal limits  CULTURE, BLOOD (ROUTINE X 2)  CULTURE, BLOOD (ROUTINE X 2)  MAGNESIUM  PROTIME-INR  INFLUENZA PANEL BY PCR (TYPE A & B)  TROPONIN I  TROPONIN I  TROPONIN I  TSH  BASIC METABOLIC PANEL  CBC  CREATININE, SERUM  SEDIMENTATION RATE  I-STAT TROPOININ, ED    EKG  EKG Interpretation  Date/Time:  Tuesday October 16 2016 16:41:42 EST Ventricular Rate:  158 PR Interval:    QRS Duration: 98 QT Interval:  311 QTC Calculation: 505 R Axis:   79 Text Interpretation:  Supraventricular tachycardia Repolarization abnormality, prob rate related Since last EKG, SVT has replaced sinus rhythm Confirmed by Cambelle Suchecki MD, Lysbeth Galas [phone]) on 10/16/2016 5:59:01 PM       Radiology Ct Head Wo Contrast  Result Date: 10/16/2016 CLINICAL DATA:  Headache and blurry vision weakness after dialysis EXAM: CT HEAD WITHOUT CONTRAST TECHNIQUE: Contiguous axial images were obtained from the base of the skull through the vertex without intravenous contrast. COMPARISON:  10/25/2009 FINDINGS: Brain: No evidence of acute infarction, hemorrhage, hydrocephalus, extra-axial collection or mass lesion/mass effect. Vascular: No hyperdense vessels.  Carotid artery calcifications. Skull: No fracture.  Mastoid air cells clear. Sinuses/Orbits: Mild mucosal thickening in the sphenoid and ethmoid sinuses. No acute orbital abnormality. Other: None IMPRESSION: No  CT evidence for acute intracranial abnormality. Electronically Signed   By: Donavan Foil M.D.   On:  10/16/2016 18:06   Dg Chest Port 1 View  Result Date: 10/16/2016 CLINICAL DATA:  Completed dialysis, hypotensive, headache EXAM: PORTABLE CHEST 1 VIEW COMPARISON:  08/04/2016 FINDINGS: Partially visualized cervical spine hardware. Low lung volumes. Mild cardiomegaly with mild central vascular congestion. No acute consolidation. Cannot exclude tiny left effusion. Atherosclerosis of the aorta. No pneumothorax. IMPRESSION: 1. Low lung volumes 2. Cardiomegaly with mild central vascular congestion 3. Possible tiny left pleural effusion Electronically Signed   By: Donavan Foil M.D.   On: 10/16/2016 18:28    Procedures .Critical Care Performed by: Duffy Bruce Authorized by: Duffy Bruce   Critical care provider statement:    Critical care time (minutes):  45   Critical care time was exclusive of:  Separately billable procedures and treating other patients   Critical care was necessary to treat or prevent imminent or life-threatening deterioration of the following conditions:  Circulatory failure, shock and dehydration   Critical care was time spent personally by me on the following activities:  Development of treatment plan with patient or surrogate, discussions with consultants, evaluation of patient's response to treatment, examination of patient, obtaining history from patient or surrogate, ordering and review of laboratory studies, ordering and review of radiographic studies, ordering and performing treatments and interventions, pulse oximetry, re-evaluation of patient's condition and review of old charts   I assumed direction of critical care for this patient from another provider in my specialty: no     (including critical care time)  Medications Ordered in ED Medications  amiodarone (NEXTERONE) 1.8 mg/mL load via infusion 150 mg (150 mg Intravenous Bolus from Bag 10/16/16 1714)    Followed by  amiodarone (NEXTERONE PREMIX) 360-4.14 MG/200ML-% (1.8 mg/mL) IV infusion (60 mg/hr  Intravenous New Bag/Given 10/16/16 1752)    Followed by  amiodarone (NEXTERONE PREMIX) 360-4.14 MG/200ML-% (1.8 mg/mL) IV infusion (not administered)  citalopram (CELEXA) tablet 10 mg (not administered)  Lanthanum Carbonate PACK 2 packet (not administered)  midodrine (PROAMATINE) tablet 10 mg (not administered)  pantoprazole (PROTONIX) EC tablet 40 mg (not administered)  cinacalcet (SENSIPAR) tablet 90 mg (not administered)  clopidogrel (PLAVIX) tablet 75 mg (not administered)  repaglinide (PRANDIN) tablet 0.5 mg (not administered)  allopurinol (ZYLOPRIM) tablet 100 mg (not administered)  colesevelam (WELCHOL) tablet 1,250 mg (not administered)  gabapentin (NEURONTIN) capsule 600 mg (not administered)  aspirin EC tablet 81 mg (not administered)  ezetimibe (ZETIA) tablet 10 mg (not administered)  multivitamin (RENA-VIT) tablet 1 tablet (not administered)  acetaminophen (TYLENOL) tablet 650 mg (not administered)    Or  acetaminophen (TYLENOL) suppository 650 mg (not administered)  ondansetron (ZOFRAN) tablet 4 mg (not administered)    Or  ondansetron (ZOFRAN) injection 4 mg (not administered)  insulin aspart (novoLOG) injection 0-9 Units (not administered)  heparin injection 5,000 Units (not administered)  fentaNYL (SUBLIMAZE) injection 25 mcg (25 mcg Intravenous Given 10/16/16 1801)  sodium chloride 0.9 % bolus 1,000 mL (0 mLs Intravenous Stopped 10/16/16 1930)  ketorolac (TORADOL) 30 MG/ML injection 30 mg (30 mg Intravenous Given 10/16/16 1955)  metoCLOPramide (REGLAN) injection 10 mg (10 mg Intravenous Given 10/16/16 1956)  diphenhydrAMINE (BENADRYL) injection 25 mg (25 mg Intravenous Given 10/16/16 1955)     Initial Impression / Assessment and Plan / ED Course  I have reviewed the triage vital signs and the nursing notes.  Pertinent labs & imaging results that were available during my care  of the patient were reviewed by me and considered in my medical decision making (see chart for  details).  Clinical Course     66 year old male with extensive past medical history here with headache and general fatigue. On arrival, patient noted to be in supraventricular tachycardia with diffuse ST depressions, likely demand and right lower related. He is also hypotensive. Etiology of multiple symptoms is unclear. Must consider possible drain bleed as patient is on blood thinner with headache. Headache may also be secondary to hypotension and hypoperfusion in the setting of primary arrhythmia versus underlying infection, the patient's wife denies any fevers. Will check broad labs. Given his hypotension, I'm hesitant to give him diltiazem so will start on amiodarone bolus and drip.   Patient taken for stat CT head which on my read, shows no acute abnormality. Will continue to workup. I also consult to cardiology given his extensive coronary history, recent NSTEMI, and new onset SVT.   Patient has converted on amiodarone. Cards will make formal recommendations, advises holding anticoag at thist ime. Otherwise, labs overall reassuring. He does have a LA of 3.2, likely 2/2 hypoperfusion from SVT and I see no signs of infection.  Final Clinical Impressions(s) / ED Diagnoses   Final diagnoses:  Chest pain  SVT (supraventricular tachycardia) ([name])  Lactic acidosis  Transient hypotension    New Prescriptions New Prescriptions   No medications on file     Duffy Bruce, MD 10/16/16 [phone]

## 2016-10-16 NOTE — Consult Note (Signed)
Cardiology Consult    Patient ID: SIMRAN MANNIS MRN: [id number], DOB/AGE: 68-22-1952   Admit date: 10/16/2016 Date of Consult: 10/16/2016  Primary Physician: [name], MD Reason for Consult: SVT Primary Cardiologist: Dr.Bensimhon Requesting Provider: Dr. Ellender Hose   History of Present Illness    EMILEO SEMEL is a 66 year old male with a past medical history significant for diastolic dysfunction hypertension, hyperlipidemia, ESRD on dialysis (T-Th-Sat), dementia and diabetes who presented to the [facility] ED for evaluation of complaints of pain all over and hypotension during dialysis. His cardiologist is Dr. [name]. He does have LV dysfunction and was admitted with a non-STEMI and underwent diamondback orbital rotation arthrectomy, PCI and drug-eluting stenting of his LAD by Dr. Martinique on 08/06/2016 . Plavix was initiated.  He has a 20-pack-year history of tobacco abuse having quit 10 years ago.  He had felt bad and had low blood pressure after dialysis on Saturday but did not want to come to the ED. Today again with dialysis his blood pressure dropped he felt bad all over. He called his wife and said that he thought he was dying. He was brought to the ED where EKG showed SVT with a rate of 158 bpm. He has no know history of atrial fibrillation or dysrhythmia.  Initial troponin was 0.03. Potassium is normal at 4.3. Lactic acid is elevated at 3.20. He was given a 150 mg bolus of amiodarone and drip was initiated.  He was seen in the ED on 09/18/16 for generalized weakness and black stools. His Hgb was stable and he was checked for pericardial effusion which was negative and he was discharged.  Echo in 12/2014 showed EF 50-55%, Normal wall motion, normal LA, grade 1 dd  Past Medical History   Past Medical History:  Diagnosis Date  . Allergic rhinitis, cause unspecified 02/24/2014  . Anemia 06/16/2011  . BENIGN PROSTATIC HYPERTROPHY 10/14/2009  . CAD, NATIVE VESSEL 02/06/2009   saw Dr.  Missy Sabins last jan 2013  . Cervical radiculopathy, chronic 02/23/2016   Right c5-6 by NCS/EMG  . CHEST PAIN 03/29/2010  . COLONIC POLYPS, HX OF 10/14/2009  . CONGESTIVE HEART FAILURE 06/18/2007  . Dementia [id number]  . DEPRESSION 10/14/2009  . Depression 09/24/2015  . DIABETES MELLITUS, TYPE II 02/01/2010  . DIASTOLIC HEART FAILURE, CHRONIC 02/06/2009  . DIZZINESS 07/17/2010  . DYSLIPIDEMIA 06/18/2007  . DYSPNEA 10/29/2008  . ESRD (end stage renal disease) on dialysis ([place]) 08/04/2010   "TTS;  " (04/18/2015)  . FOOT PAIN 08/12/2008  . GAIT DISTURBANCE 03/03/2010  . GASTROENTERITIS, VIRAL 10/14/2009  . GERD 06/18/2007  . GOITER, MULTINODULAR 12/26/2007  . GOUT 06/18/2007  . GYNECOMASTIA 07/17/2010  . Hemodialysis access, fistula [facility])    Dialysis T-Th-Sa ([place]) Right upper arm fistula  . Hyperlipidemia 10/16/2011  . Hyperparathyroidism, secondary ([place]) 06/16/2011  . HYPERTENSION 06/18/2007  . Hyperthyroidism   . HYPERTHYROIDISM 02/02/2010  . Hypocalcemia 06/07/2010  . Ischemic cardiomyopathy 06/16/2011  . NECK PAIN 07/31/2010  . NSTEMI (non-ST elevated myocardial infarction) ([place]) 08/04/2016  . ONYCHOMYCOSIS, TOENAILS 12/26/2007  . OSA on CPAP 10/16/2011  . Other malaise and fatigue 11/24/2009  . PERIPHERAL NEUROPATHY 06/18/2007  . Prostate cancer ([place])   . PULMONARY NODULE, RIGHT LOWER LOBE 06/08/2009  . Renal insufficiency   . Sleep apnea    cpap machine and o2  . TRANSAMINASES, SERUM, ELEVATED 02/01/2010  . Transfusion history    none recent  . Unspecified hypotension 01/30/2010  Past Surgical History:  Procedure Laterality Date  . ARTERIOVENOUS GRAFT PLACEMENT Right 2009   forearm/notes 02/01/2011  . AV FISTULA PLACEMENT  11/07/2011   Procedure: INSERTION OF ARTERIOVENOUS (AV) GORE-TEX GRAFT ARM;  Surgeon: Tinnie Gens, MD;  Location: [place];  Service: Vascular;  Laterality: Left;  . BACK SURGERY  1998  . BASCILIC VEIN TRANSPOSITION Right 02/27/2013   Procedure:  BASCILIC VEIN TRANSPOSITION;  Surgeon: Mal Misty, MD;  Location: [place];  Service: Vascular;  Laterality: Right;  Right Basilic Vein Transposition   . CARDIAC CATHETERIZATION N/A 08/06/2016   Procedure: Left Heart Cath and Coronary Angiography;  Surgeon: Jolaine Artist, MD;  Location: [place] CV LAB;  Service: Cardiovascular;  Laterality: N/A;  . CARDIAC CATHETERIZATION N/A 08/07/2016   Procedure: Coronary/Graft Atherectomy-CSI LAD;  Surgeon: Peter M Martinique, MD;  Location: [place] CV LAB;  Service: Cardiovascular;  Laterality: N/A;  . CERVICAL SPINE SURGERY  2/09   "to repair nerve problems in my left arm"  . CHOLECYSTECTOMY    . CORONARY ANGIOPLASTY WITH STENT PLACEMENT  06/11/2008  . CORONARY ANGIOPLASTY WITH STENT PLACEMENT  06/2007   TAXUS stent to RCA/notes 01/31/2011  . ESOPHAGOGASTRODUODENOSCOPY  09/28/2011   Procedure: ESOPHAGOGASTRODUODENOSCOPY (EGD);  Surgeon: Missy Sabins, MD;  Location: [name] ENDOSCOPY;  Service: Endoscopy;  Laterality: N/A;  . ESOPHAGOGASTRODUODENOSCOPY N/A 04/07/2015   Procedure: ESOPHAGOGASTRODUODENOSCOPY (EGD);  Surgeon: Teena Irani, MD;  Location: [facility] ENDOSCOPY;  Service: Endoscopy;  Laterality: N/A;  . ESOPHAGOGASTRODUODENOSCOPY N/A 04/19/2015   Procedure: ESOPHAGOGASTRODUODENOSCOPY (EGD);  Surgeon: Arta Silence, MD;  Location: [facility] ENDOSCOPY;  Service: Endoscopy;  Laterality: N/A;  . FOREIGN BODY REMOVAL  09/2003   via upper endoscopy/notes 02/12/2011  . GIVENS CAPSULE STUDY  09/30/2011   Procedure: GIVENS CAPSULE STUDY;  Surgeon: Jeryl Columbia, MD;  Location: [facility] ENDOSCOPY;  Service: Endoscopy;  Laterality: N/A;  . INSERTION OF DIALYSIS CATHETER Right 2014  . INSERTION OF DIALYSIS CATHETER Left 02/11/2013   Procedure: INSERTION OF DIALYSIS CATHETER;  Surgeon: Conrad Grand Tower, MD;  Location: [place];  Service: Vascular;  Laterality: Left;  Ultrasound guided  . REMOVAL OF A DIALYSIS CATHETER Right 02/11/2013   Procedure: REMOVAL OF A DIALYSIS CATHETER;  Surgeon:  Conrad Stoutsville, MD;  Location: [place];  Service: Vascular;  Laterality: Right;  . SAVORY DILATION N/A 04/07/2015   Procedure: SAVORY DILATION;  Surgeon: Teena Irani, MD;  Location: [facility] ENDOSCOPY;  Service: Endoscopy;  Laterality: N/A;  . SHUNTOGRAM N/A 09/20/2011   Procedure: Earney Mallet;  Surgeon: Conrad La Huerta, MD;  Location: [facility] CATH LAB;  Service: Cardiovascular;  Laterality: N/A;  . TONSILLECTOMY    . TOTAL KNEE ARTHROPLASTY Right 08/02/2015   Procedure: TOTAL KNEE ARTHROPLASTY;  Surgeon: Renette Butters, MD;  Location: [place];  Service: Orthopedics;  Laterality: Right;  . VENOGRAM N/A 01/26/2013   Procedure: VENOGRAM;  Surgeon: Angelia Mould, MD;  Location: [facility] CATH LAB;  Service: Cardiovascular;  Laterality: N/A;     Allergies  Allergies  Allergen Reactions  . Cephalexin Swelling and Other (See Comments)    Tongue swelling  . Statins Other (See Comments)    Weak muscles  . Ciprofloxacin Rash    Inpatient Medications      Family History    Family History  Problem Relation Age of Onset  . Heart disease Sister   . Thyroid nodules Sister   . Heart disease Father   . Diabetes Father   . Kidney failure Father   .  Hypertension Father   . Healthy Child   . Healthy Child   . Healthy Child   . Cancer Neg Hx     Social History    Social History   Social History  . Marital status: Married    Spouse name: N/A  . Number of children: 3  . Years of education: N/A   Occupational History  . 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    Social History Main Topics  . Smoking status: Former Smoker    Packs/day: 1.00    Years: 25.00    Types: Cigarettes    Quit date: 10/01/2005  . Smokeless tobacco: Former Systems developer    Quit date: 10/01/2005     Comment: Quit smoking 2007 Smoked x 25 years 1/2 ppd.  . Alcohol use No  . Drug use: No  . Sexual activity: No   Other Topics Concern  . Not on file   Social History Narrative  . No narrative on file      Review of Systems    General:  No chills, fever, night sweats or weight changes.  Cardiovascular:  No chest pain, dyspnea on exertion, edema, orthopnea, palpitations, paroxysmal nocturnal dyspnea. Dermatological: No rash, lesions/masses Respiratory: No cough, dyspnea Urologic: No hematuria, dysuria Abdominal:   No nausea, vomiting, diarrhea, bright red blood per rectum, melena, or hematemesis Neurologic: Positive for severe frontal headache and blurred vision All other systems reviewed and are otherwise negative except as noted above.  Physical Exam    Blood pressure 101/85, pulse (!) 158, temperature 97.9 F (36.6 C), temperature source Oral, resp. rate 15, SpO2 99 %.  General: Well developed AA male, NAD Psych: Normal affect. Neuro: Alert and oriented X 3. Moves all extremities spontaneously. HEENT: Normal  Neck: Supple without bruits or JVD. Lungs:  Resp regular and unlabored, CTA. Heart: RRR no s3, s4, or murmurs. Abdomen: Soft, non-tender, non-distended, BS + x 4.  Extremities: No clubbing, cyanosis or edema. DP/PT/Radials 2+ and equal bilaterally.  Labs    Troponin [facility] Test)  Recent Labs  10/16/16 1725  TROPIPOC 0.03   No results for input(s): CKTOTAL, CKMB, TROPONINI in the last 72 hours. Lab Results  Component Value Date   WBC 10.1 10/16/2016   HGB 10.2 (L) 10/16/2016   HCT 30.0 (L) 10/16/2016   MCV 99.7 10/16/2016   PLT 267 10/16/2016    Recent Labs Lab 10/16/16 1704 10/16/16 1726  NA 136 137  K 4.3 4.3  CL 97* 99*  CO2 22  --   BUN 20 28*  CREATININE 6.61* 6.80*  CALCIUM 8.4*  --   PROT 7.2  --   BILITOT 0.6  --   ALKPHOS 101  --   ALT 26  --   AST 37  --   GLUCOSE 166* 170*   Lab Results  Component Value Date   CHOL 263 (H) 07/16/2014   HDL 27.90 (L) 07/16/2014   LDLCALC 185 (H) 01/13/2014   TRIG (H) 07/16/2014    409.0 Triglyceride is over 400; calculations on Lipids are invalid.      Radiology Studies    Ct Head Wo  Contrast  Result Date: 10/16/2016 CLINICAL DATA:  Headache and blurry vision weakness after dialysis EXAM: CT HEAD WITHOUT CONTRAST TECHNIQUE: Contiguous axial images were obtained from the base of the skull through the vertex without intravenous contrast. COMPARISON:  10/25/2009 FINDINGS: Brain: No evidence of acute infarction, hemorrhage, hydrocephalus, extra-axial collection or mass lesion/mass  effect. Vascular: No hyperdense vessels.  Carotid artery calcifications. Skull: No fracture.  Mastoid air cells clear. Sinuses/Orbits: Mild mucosal thickening in the sphenoid and ethmoid sinuses. No acute orbital abnormality. Other: None IMPRESSION: No CT evidence for acute intracranial abnormality. Electronically Signed   By: Donavan Foil M.D.   On: 10/16/2016 18:06   Dg Chest Port 1 View  Result Date: 10/16/2016 CLINICAL DATA:  Completed dialysis, hypotensive, headache EXAM: PORTABLE CHEST 1 VIEW COMPARISON:  08/04/2016 FINDINGS: Partially visualized cervical spine hardware. Low lung volumes. Mild cardiomegaly with mild central vascular congestion. No acute consolidation. Cannot exclude tiny left effusion. Atherosclerosis of the aorta. No pneumothorax. IMPRESSION: 1. Low lung volumes 2. Cardiomegaly with mild central vascular congestion 3. Possible tiny left pleural effusion Electronically Signed   By: Donavan Foil M.D.   On: 10/16/2016 18:28    EKG & Cardiac Imaging    EKG: SVT 158 bpm  Echocardiogram: ordered  Assessment & Plan    1. SVT -Presented in SVT post dialysis with blood pressure drop during dialysis. He denies chest pain or shortness of breath. His main concern is a severe frontal headache. -EKG shows SVT with rate of 158 bpm, BP 101/58 -Recent Non-STEMI and atherectomy/stent placement on 08/06/2016 with plavix initiated. -Initial troponin was 0.03. Potassium is normal at 4.3. Lactic acid is elevated at 3.20. He was given a 150 mg bolus of amiodarone and drip was initiated. -Converted  to NSR in 70's-80's at 1901 and is having brief bursts of SVT in the 130's. BP is soft. -Complete this bag of amiodarone and discontinue.  -Now having mild chest discomfort. Will cycle troponins and order echo to evaluate LV function and wall motion.  -He is to be admitted to hospitalist service and we will follow  2. Headache -CT of the head negative for acute intracranial abnormality -Will give toradol for pain  3. CAD S/P NSTEMI and stent -Having mild chest pain. 1st troponin negative. Will cycle troponins. -Continue aspirin 81 mg and plavix.   4. Dyslipidemia -Last lipid panel noted in Epic in 07/2014 with triglycerides 409 and unable to calculate LDL, total cholesterol 263 -Continue Welchol and zetia  5. Diabetes mellitus -Hgb A1c on 06/18/2016- 7.3 -Manage per IM  6. ESRD with hemodialysis (T-TH-S) -Takes midodrine on dialysis days -Pt has been hypotensive with dialysis today and on Saturday -Manage per nephrology  Signed, Daune Perch, NP-C 10/16/2016, 6:33 PM Pager: [phone]  The patient was seen, examined and discussed with Daune Perch, NP and agree as above.  66 year old male with h/o hypertension, hyperlipidemia, ESRD on dialysis (T-Th-Sat), dementia and diabetes, CAD, s/p NSTEMI, PCI/stenting to LAD in 08/2016.  He developed headache at HD that was terminated early, came to ER, he was found to be in SVT with rate 140-160 BPM, cardioverted on amiodarone iv. His BP low in 80-90'. After cardioversion his headache worsened with blurry vision. Also retrosternal chest pain that he rates as mild. The first troponin negative. We will continue to cycle and perform 12 lead ECG now that he is not in SVT. Ketorolac for headache, CT head negative for bleeding.  If rules out for ACS, I would have rimary team manage for headache/midgraine.    Ena Dawley, MD 10/16/2016

## 2016-10-16 NOTE — ED Notes (Signed)
Notified Dr. Ellender Hose that phlebotomy unable to obtain blood cultures.

## 2016-10-16 NOTE — ED Triage Notes (Signed)
Patient from dialysis and completed dialysis before they called out for hypotension with a pressure of 80/50. Was not hypotensive with EMS but is CO a headache "all over his head, with blurry vision".

## 2016-10-16 NOTE — H&P (Signed)
History and Physical    [name] AST:[id number] DOB: 22-Jun-1951 DOA: 10/16/2016  PCP: [name], MD  Patient coming from: Home.  Chief Complaint: Headache and hypotension.  HPI: [name] is a 66 y.o. male with history of ESRD on hemodialysis on Tuesday Thursdays and Saturday, CAD status post stenting in November 2017, diabetes mellitus type 2, history of gout was brought to the ER from dialysis center after patient was found to be hypotensive and complained of headache. CT of head was unremarkable. In the ER patient was found to be in SVT and hypotensive. Patient was started on amiodarone bolus and infusion. Cardiology was consulted and patient's rhythm converted to sinus after starting amiodarone. Patient denies any chest pain or shortness of breath but did have some pain around his neck area along with the headache. Headache is mostly frontal with some blurring of vision which has improved after Toradol. Patient will be admitted for further management of SVT and headache.  ED Course: Patient was started on IV amiodarone infusion for SVT. CT head was unremarkable.  Review of Systems: As per HPI, rest all negative.   Past Medical History:  Diagnosis Date  . Allergic rhinitis, cause unspecified 02/24/2014  . Anemia 06/16/2011  . BENIGN PROSTATIC HYPERTROPHY 10/14/2009  . CAD, NATIVE VESSEL 02/06/2009   saw Dr. Missy Sabins last jan 2013  . Cervical radiculopathy, chronic 02/23/2016   Right c5-6 by NCS/EMG  . CHEST PAIN 03/29/2010  . COLONIC POLYPS, HX OF 10/14/2009  . CONGESTIVE HEART FAILURE 06/18/2007  . Dementia [id number]  . DEPRESSION 10/14/2009  . Depression 09/24/2015  . DIABETES MELLITUS, TYPE II 02/01/2010  . DIASTOLIC HEART FAILURE, CHRONIC 02/06/2009  . DIZZINESS 07/17/2010  . DYSLIPIDEMIA 06/18/2007  . DYSPNEA 10/29/2008  . ESRD (end stage renal disease) on dialysis ([place]) 08/04/2010   "TTS;  " (04/18/2015)  . FOOT PAIN 08/12/2008  . GAIT DISTURBANCE 03/03/2010  .  GASTROENTERITIS, VIRAL 10/14/2009  . GERD 06/18/2007  . GOITER, MULTINODULAR 12/26/2007  . GOUT 06/18/2007  . GYNECOMASTIA 07/17/2010  . Hemodialysis access, fistula mature [facility])    Dialysis T-Th-Sa ([place]) Right upper arm fistula  . Hyperlipidemia 10/16/2011  . Hyperparathyroidism, secondary ([place]) 06/16/2011  . HYPERTENSION 06/18/2007  . Hyperthyroidism   . HYPERTHYROIDISM 02/02/2010  . Hypocalcemia 06/07/2010  . Ischemic cardiomyopathy 06/16/2011  . NECK PAIN 07/31/2010  . NSTEMI (non-ST elevated myocardial infarction) ([place]) 08/04/2016  . ONYCHOMYCOSIS, TOENAILS 12/26/2007  . OSA on CPAP 10/16/2011  . Other malaise and fatigue 11/24/2009  . PERIPHERAL NEUROPATHY 06/18/2007  . Prostate cancer ([place])   . PULMONARY NODULE, RIGHT LOWER LOBE 06/08/2009  . Renal insufficiency   . Sleep apnea    cpap machine and o2  . TRANSAMINASES, SERUM, ELEVATED 02/01/2010  . Transfusion history    none recent  . Unspecified hypotension 01/30/2010    Past Surgical History:  Procedure Laterality Date  . ARTERIOVENOUS GRAFT PLACEMENT Right 2009   forearm/notes 02/01/2011  . AV FISTULA PLACEMENT  11/07/2011   Procedure: INSERTION OF ARTERIOVENOUS (AV) GORE-TEX GRAFT ARM;  Surgeon: Tinnie Gens, MD;  Location: [place];  Service: Vascular;  Laterality: Left;  . BACK SURGERY  1998  . BASCILIC VEIN TRANSPOSITION Right 02/27/2013   Procedure: BASCILIC VEIN TRANSPOSITION;  Surgeon: Mal Misty, MD;  Location: [place];  Service: Vascular;  Laterality: Right;  Right Basilic Vein Transposition   . CARDIAC CATHETERIZATION N/A 08/06/2016   Procedure: Left Heart Cath and Coronary Angiography;  Surgeon: Jolaine Artist, MD;  Location: [place] CV LAB;  Service: Cardiovascular;  Laterality: N/A;  . CARDIAC CATHETERIZATION N/A 08/07/2016   Procedure: Coronary/Graft Atherectomy-CSI LAD;  Surgeon: Peter M Martinique, MD;  Location: [place] CV LAB;  Service: Cardiovascular;  Laterality: N/A;  . CERVICAL SPINE  SURGERY  2/09   "to repair nerve problems in my left arm"  . CHOLECYSTECTOMY    . CORONARY ANGIOPLASTY WITH STENT PLACEMENT  06/11/2008  . CORONARY ANGIOPLASTY WITH STENT PLACEMENT  06/2007   TAXUS stent to RCA/notes 01/31/2011  . ESOPHAGOGASTRODUODENOSCOPY  09/28/2011   Procedure: ESOPHAGOGASTRODUODENOSCOPY (EGD);  Surgeon: Missy Sabins, MD;  Location: [facility] ENDOSCOPY;  Service: Endoscopy;  Laterality: N/A;  . ESOPHAGOGASTRODUODENOSCOPY N/A 04/07/2015   Procedure: ESOPHAGOGASTRODUODENOSCOPY (EGD);  Surgeon: Teena Irani, MD;  Location: [facility] ENDOSCOPY;  Service: Endoscopy;  Laterality: N/A;  . ESOPHAGOGASTRODUODENOSCOPY N/A 04/19/2015   Procedure: ESOPHAGOGASTRODUODENOSCOPY (EGD);  Surgeon: Arta Silence, MD;  Location: [facility] ENDOSCOPY;  Service: Endoscopy;  Laterality: N/A;  . FOREIGN BODY REMOVAL  09/2003   via upper endoscopy/notes 02/12/2011  . GIVENS CAPSULE STUDY  09/30/2011   Procedure: GIVENS CAPSULE STUDY;  Surgeon: Jeryl Columbia, MD;  Location: [facility] ENDOSCOPY;  Service: Endoscopy;  Laterality: N/A;  . INSERTION OF DIALYSIS CATHETER Right 2014  . INSERTION OF DIALYSIS CATHETER Left 02/11/2013   Procedure: INSERTION OF DIALYSIS CATHETER;  Surgeon: Conrad Le Grand, MD;  Location: [place];  Service: Vascular;  Laterality: Left;  Ultrasound guided  . REMOVAL OF A DIALYSIS CATHETER Right 02/11/2013   Procedure: REMOVAL OF A DIALYSIS CATHETER;  Surgeon: Conrad Linntown, MD;  Location: [place];  Service: Vascular;  Laterality: Right;  . SAVORY DILATION N/A 04/07/2015   Procedure: SAVORY DILATION;  Surgeon: Teena Irani, MD;  Location: [facility] ENDOSCOPY;  Service: Endoscopy;  Laterality: N/A;  . SHUNTOGRAM N/A 09/20/2011   Procedure: Earney Mallet;  Surgeon: Conrad Parkdale, MD;  Location: [facility] CATH LAB;  Service: Cardiovascular;  Laterality: N/A;  . TONSILLECTOMY    . TOTAL KNEE ARTHROPLASTY Right 08/02/2015   Procedure: TOTAL KNEE ARTHROPLASTY;  Surgeon: Renette Butters, MD;  Location: [place];  Service: Orthopedics;  Laterality: Right;   . VENOGRAM N/A 01/26/2013   Procedure: VENOGRAM;  Surgeon: Angelia Mould, MD;  Location: [facility] CATH LAB;  Service: Cardiovascular;  Laterality: N/A;     reports that he quit smoking about 11 years ago. His smoking use included Cigarettes. He has a 25.00 pack-year smoking history. He quit smokeless tobacco use about 11 years ago. He reports that he does not drink alcohol or use drugs.  Allergies  Allergen Reactions  . Cephalexin Swelling and Other (See Comments)    Tongue swelling  . Statins Other (See Comments)    Weak muscles  . Ciprofloxacin Rash    Family History  Problem Relation Age of Onset  . Heart disease Sister   . Thyroid nodules Sister   . Heart disease Father   . Diabetes Father   . Kidney failure Father   . Hypertension Father   . Healthy Child   . Healthy Child   . Healthy Child   . Cancer Neg Hx     Prior to Admission medications   Medication Sig Start Date End Date Taking? Authorizing Provider  acetaminophen (TYLENOL) 325 MG tablet Take 650 mg by mouth every 6 (six) hours as needed for mild pain or headache.    Yes Historical Provider, MD  allopurinol (ZYLOPRIM) 100 MG tablet Take 1  tablet (100 mg total) by mouth daily. 07/20/16  Yes Biagio Borg, MD  aspirin 81 MG tablet Take 81 mg by mouth daily.   Yes Historical Provider, MD  citalopram (CELEXA) 10 MG tablet Take 10 mg by mouth 3 (three) times a week.   Yes Historical Provider, MD  clopidogrel (PLAVIX) 75 MG tablet Take 1 tablet (75 mg total) by mouth daily with breakfast. 09/05/16  Yes Jolaine Artist, MD  colesevelam (WELCHOL) 625 MG tablet TAKE THREE TABLETS BY MOUTH TWICE DAILY WITH MEALS Patient taking differently: Take 1,250 mg by mouth 2 (two) times daily with a meal.  07/20/16  Yes Biagio Borg, MD  ezetimibe (ZETIA) 10 MG tablet Take 1 tablet (10 mg total) by mouth daily. Patient taking differently: Take 10 mg by mouth at bedtime.  03/15/16  Yes Jolaine Artist, MD  gabapentin (NEURONTIN)  300 MG capsule 1-2 tabs by mouth at bedtime for sleep and pain Patient taking differently: Take 600 mg by mouth at bedtime.  07/20/16  Yes Biagio Borg, MD  Lanthanum Carbonate 1000 MG PACK Take 2 packets by mouth 2 (two) times daily.   Yes Historical Provider, MD  midodrine (PROAMATINE) 10 MG tablet Take 10 mg by mouth See admin instructions. Once a day only on dialysis days (Tues/Thurs/Sat) 08/27/16  Yes Historical Provider, MD  multivitamin (RENA-VIT) TABS tablet Take 1 tablet by mouth at bedtime. Reported on 10/24/2015   Yes Historical Provider, MD  pantoprazole (PROTONIX) 40 MG tablet Take 40 mg by mouth 2 (two) times daily.  08/31/16  Yes Historical Provider, MD  repaglinide (PRANDIN) 0.5 MG tablet Take 1 tablet (0.5 mg total) by mouth daily with breakfast. 08/15/16  Yes Biagio Borg, MD  SENSIPAR 90 MG tablet Take 90 mg by mouth 2 (two) times daily. 09/18/16  Yes Historical Provider, MD    Physical Exam: Vitals:   10/16/16 2100 10/16/16 2130 10/16/16 2145 10/16/16 2200  BP: (!) 104/54 (!) 102/54 99/61 116/60  Pulse: 85 89 85 82  Resp: 21 24 16 19   Temp:      TempSrc:      SpO2: 98% 99% 100% 99%      Constitutional: Moderately built and nourished. Vitals:   10/16/16 2100 10/16/16 2130 10/16/16 2145 10/16/16 2200  BP: (!) 104/54 (!) 102/54 99/61 116/60  Pulse: 85 89 85 82  Resp: 21 24 16 19   Temp:      TempSrc:      SpO2: 98% 99% 100% 99%   Eyes: Anicteric pallor. ENMT: No discharge from the ears eyes nose and mouth. Neck: No mass felt. No neck rigidity. No JVD appreciated. Respiratory: No rhonchi or crepitations. Cardiovascular: S1 and S2 heard no murmurs appreciated. Abdomen: Soft nontender bowel sounds present. Musculoskeletal: No edema. No joint effusion. Skin: Appears warm. No rash. Neurologic: Alert awake oriented to time place and person. Moves all extremities 5 x 5. No facial asymmetry. Tongue is midline. Psychiatric: Appears normal. Normal affect.   Labs on  Admission: I have personally reviewed following labs and imaging studies  CBC:  Recent Labs Lab 10/16/16 1704 10/16/16 1726  WBC 10.1  --   NEUTROABS 6.5  --   HGB 10.3* 10.2*  HCT 33.7* 30.0*  MCV 99.7  --   PLT 267  --    Basic Metabolic Panel:  Recent Labs Lab 10/16/16 1704 10/16/16 1726  NA 136 137  K 4.3 4.3  CL 97* 99*  CO2 22  --  GLUCOSE 166* 170*  BUN 20 28*  CREATININE 6.61* 6.80*  CALCIUM 8.4*  --   MG 2.0  --    GFR: Estimated Creatinine Clearance: 12.7 mL/min (by C-G formula based on SCr of 6.8 mg/dL (H)). Liver Function Tests:  Recent Labs Lab 10/16/16 1704  AST 37  ALT 26  ALKPHOS 101  BILITOT 0.6  PROT 7.2  ALBUMIN 3.5   No results for input(s): LIPASE, AMYLASE in the last 168 hours. No results for input(s): AMMONIA in the last 168 hours. Coagulation Profile:  Recent Labs Lab 10/16/16 1704  INR 0.88   Cardiac Enzymes: No results for input(s): CKTOTAL, CKMB, CKMBINDEX, TROPONINI in the last 168 hours. BNP (last 3 results) No results for input(s): PROBNP in the last 8760 hours. HbA1C: No results for input(s): HGBA1C in the last 72 hours. CBG:  Recent Labs Lab 10/15/16 1331 10/15/16 1425  GLUCAP 158* 144*   Lipid Profile: No results for input(s): CHOL, HDL, LDLCALC, TRIG, CHOLHDL, LDLDIRECT in the last 72 hours. Thyroid Function Tests: No results for input(s): TSH, T4TOTAL, FREET4, T3FREE, THYROIDAB in the last 72 hours. Anemia Panel: No results for input(s): VITAMINB12, FOLATE, FERRITIN, TIBC, IRON, RETICCTPCT in the last 72 hours. Urine analysis:    Component Value Date/Time   COLORURINE LT. YELLOW 06/26/2013 1200   APPEARANCEUR CLEAR 06/26/2013 1200   LABSPEC 1.020 06/26/2013 1200   PHURINE 8.5 06/26/2013 1200   GLUCOSEU 100 06/26/2013 1200   HGBUR MODERATE 06/26/2013 1200   BILIRUBINUR NEGATIVE 06/26/2013 1200   KETONESUR NEGATIVE 06/26/2013 1200   PROTEINUR >300 (A) 01/04/2010 1032   UROBILINOGEN 0.2 06/26/2013  1200   NITRITE NEGATIVE 06/26/2013 1200   LEUKOCYTESUR TRACE 06/26/2013 1200   Sepsis Labs: @LABRCNTIP (procalcitonin:4,lacticidven:4) )No results found for this or any previous visit (from the past 240 hour(s)).   Radiological Exams on Admission: Ct Head Wo Contrast  Result Date: 10/16/2016 CLINICAL DATA:  Headache and blurry vision weakness after dialysis EXAM: CT HEAD WITHOUT CONTRAST TECHNIQUE: Contiguous axial images were obtained from the base of the skull through the vertex without intravenous contrast. COMPARISON:  10/25/2009 FINDINGS: Brain: No evidence of acute infarction, hemorrhage, hydrocephalus, extra-axial collection or mass lesion/mass effect. Vascular: No hyperdense vessels.  Carotid artery calcifications. Skull: No fracture.  Mastoid air cells clear. Sinuses/Orbits: Mild mucosal thickening in the sphenoid and ethmoid sinuses. No acute orbital abnormality. Other: None IMPRESSION: No CT evidence for acute intracranial abnormality. Electronically Signed   By: Donavan Foil M.D.   On: 10/16/2016 18:06   Dg Chest Port 1 View  Result Date: 10/16/2016 CLINICAL DATA:  Completed dialysis, hypotensive, headache EXAM: PORTABLE CHEST 1 VIEW COMPARISON:  08/04/2016 FINDINGS: Partially visualized cervical spine hardware. Low lung volumes. Mild cardiomegaly with mild central vascular congestion. No acute consolidation. Cannot exclude tiny left effusion. Atherosclerosis of the aorta. No pneumothorax. IMPRESSION: 1. Low lung volumes 2. Cardiomegaly with mild central vascular congestion 3. Possible tiny left pleural effusion Electronically Signed   By: Donavan Foil M.D.   On: 10/16/2016 18:28    EKG: Independently reviewed. First EKG showed SVT. Second EKG showed normal sinus rhythm with diffuse ST-T changes.  Assessment/Plan Principal Problem:   SVT (supraventricular tachycardia) ([name]) Active Problems:   Dementia   Anemia   Ischemic cardiomyopathy   CAD (coronary artery disease)   OSA  (obstructive sleep apnea)   Headache   DM (diabetes mellitus) type II controlled with renal manifestation ([name])   Transient hypotension   ESRD on hemodialysis ([place])  1. SVT - appreciate cardiology consult. Patient is on amiodarone infusion. Check TSH cycle cardiac markers check 2-D echo. 2. Headache - CT head is unremarkable. Patient still has mild headache. Will check sedimentation rate and since patient also was complaining of neck pain along with headache will get CT angiogram of the head and neck. 3. CAD status post stenting in November 2017 - on Plavix and aspirin and patient is also on WelChol and Zetia. Intolerant to statins. Since patient has neck pain we will cycle cardiac markers. 4. ESRD on hemodialysis on Tuesday Thursday since Saturday - had dialysis today. Consult nephrology for dialysis. 5. Diabetes mellitus type 2 on Prandin - will continue Prandin along with sliding scale coverage. 6. Chronic anemia probably from ESRD - follow CBC. 7. Sleep apnea on CPAP.  Patient was hypotensive on presentation with elevated lactate. Patient has no signs of infection. Blood cultures were sent.   DVT prophylaxis: Heparin. Code Status: Full code.  Family Communication: Patient's wife.  Disposition Plan: Home.  Consults called: Cardiology.  Admission status: Inpatient.    Rise Patience MD [facility] Pager [phone].  If 7PM-7AM, please contact night-coverage [email or web address] Password [email or web address]  10/16/2016, 10:12 PM

## 2016-10-16 NOTE — ED Notes (Signed)
Paused IV, flushed line, gave pain medicine and reattached IV

## 2016-10-16 NOTE — ED Notes (Signed)
Notified pharmacy of need for Amiodarone gtt in order to continue.

## 2016-10-16 NOTE — ED Notes (Signed)
Hospitalist at bedside at this time 

## 2016-10-16 NOTE — ED Notes (Signed)
EKG given to Dr. Ellender Hose

## 2016-10-17 ENCOUNTER — Encounter (HOSPITAL_COMMUNITY)
Admission: EM | Disposition: A | Discharge status: Home / Self Care | Payer: Self-pay | Source: Home / Self Care | Attending: Internal Medicine

## 2016-10-17 ENCOUNTER — Encounter (HOSPITAL_COMMUNITY): Payer: Medicare Other

## 2016-10-17 ENCOUNTER — Encounter: Payer: Self-pay | Admitting: Adult Health

## 2016-10-17 ENCOUNTER — Inpatient Hospital Stay (HOSPITAL_COMMUNITY): Payer: Medicare Other

## 2016-10-17 ENCOUNTER — Encounter (HOSPITAL_COMMUNITY): Payer: Self-pay

## 2016-10-17 DIAGNOSIS — D631 Anemia in chronic kidney disease: Secondary | ICD-10-CM

## 2016-10-17 DIAGNOSIS — R9431 Abnormal electrocardiogram [ECG] [EKG]: Secondary | ICD-10-CM

## 2016-10-17 DIAGNOSIS — I214 Non-ST elevation (NSTEMI) myocardial infarction: Secondary | ICD-10-CM

## 2016-10-17 DIAGNOSIS — N186 End stage renal disease: Secondary | ICD-10-CM

## 2016-10-17 DIAGNOSIS — Z992 Dependence on renal dialysis: Secondary | ICD-10-CM

## 2016-10-17 LAB — BASIC METABOLIC PANEL
Anion gap: 13 (ref 5–15)
BUN: 25 mg/dL — ABNORMAL HIGH (ref 6–20)
CHLORIDE: 101 mmol/L (ref 101–111)
CO2: 23 mmol/L (ref 22–32)
Calcium: 8.1 mg/dL — ABNORMAL LOW (ref 8.9–10.3)
Creatinine, Ser: 7.19 mg/dL — ABNORMAL HIGH (ref 0.61–1.24)
GFR calc Af Amer: 8 mL/min — ABNORMAL LOW (ref >60)
GFR calc non Af Amer: 7 mL/min — ABNORMAL LOW (ref >60)
GLUCOSE: 72 mg/dL (ref 65–99)
Potassium: 4.3 mmol/L (ref 3.5–5.1)
Sodium: 137 mmol/L (ref 135–145)

## 2016-10-17 LAB — CBC
HEMATOCRIT: 28.2 % — AB (ref 39.0–52.0)
HEMOGLOBIN: 8.7 g/dL — AB (ref 13.0–17.0)
MCH: 30.9 pg (ref 26.0–34.0)
MCHC: 30.9 g/dL (ref 30.0–36.0)
MCV: 100 fL (ref 78.0–100.0)
Platelets: 263 10*3/uL (ref 150–400)
RBC: 2.82 MIL/uL — AB (ref 4.22–5.81)
RDW: 19.5 % — ABNORMAL HIGH (ref 11.5–15.5)
WBC: 8.4 10*3/uL (ref 4.0–10.5)

## 2016-10-17 LAB — GLUCOSE, CAPILLARY
GLUCOSE-CAPILLARY: 101 mg/dL — AB (ref 65–99)
GLUCOSE-CAPILLARY: 110 mg/dL — AB (ref 65–99)
GLUCOSE-CAPILLARY: 131 mg/dL — AB (ref 65–99)
Glucose-Capillary: 100 mg/dL — ABNORMAL HIGH (ref 65–99)

## 2016-10-17 LAB — LACTIC ACID, PLASMA: LACTIC ACID, VENOUS: 1.2 mmol/L (ref 0.5–1.9)

## 2016-10-17 LAB — IRON AND TIBC
Iron: 42 ug/dL — ABNORMAL LOW (ref 45–182)
Saturation Ratios: 17 % — ABNORMAL LOW (ref 17.9–39.5)
TIBC: 246 ug/dL — AB (ref 250–450)
UIBC: 204 ug/dL

## 2016-10-17 LAB — ECHOCARDIOGRAM COMPLETE
HEIGHTINCHES: 72 in
WEIGHTICAEL: 3858.93 [oz_av]

## 2016-10-17 LAB — MRSA PCR SCREENING: MRSA BY PCR: NEGATIVE

## 2016-10-17 LAB — TROPONIN I
TROPONIN I: 2.74 ng/mL — AB (ref <0.03)
Troponin I: 2.67 ng/mL (ref <0.03)

## 2016-10-17 LAB — CREATININE, SERUM
Creatinine, Ser: 7.05 mg/dL — ABNORMAL HIGH (ref 0.61–1.24)
GFR calc Af Amer: 8 mL/min — ABNORMAL LOW (ref >60)
GFR calc non Af Amer: 7 mL/min — ABNORMAL LOW (ref >60)

## 2016-10-17 LAB — INFLUENZA PANEL BY PCR (TYPE A & B)
INFLAPCR: NEGATIVE
INFLBPCR: NEGATIVE

## 2016-10-17 LAB — HEPARIN LEVEL (UNFRACTIONATED): HEPARIN UNFRACTIONATED: 0.3 [IU]/mL (ref 0.30–0.70)

## 2016-10-17 LAB — T4, FREE: FREE T4: 1 ng/dL (ref 0.61–1.12)

## 2016-10-17 LAB — FERRITIN: Ferritin: 709 ng/mL — ABNORMAL HIGH (ref 24–336)

## 2016-10-17 SURGERY — LEFT HEART CATH AND CORONARY ANGIOGRAPHY

## 2016-10-17 MED ORDER — HEPARIN SODIUM (PORCINE) 5000 UNIT/ML IJ SOLN: 5000.0000 [IU] | Freq: Three times a day (TID) | INTRAMUSCULAR | Status: DC

## 2016-10-17 MED ORDER — IOPAMIDOL (ISOVUE-370) INJECTION 76%
INTRAVENOUS | Status: AC
  Administered 2016-10-17: 50 mL
  Filled 2016-10-17: qty 50

## 2016-10-17 MED ORDER — HEPARIN (PORCINE) IN NACL 100-0.45 UNIT/ML-% IJ SOLN
1400.0000 [IU]/h | INTRAMUSCULAR | Status: DC
  Administered 2016-10-17: 1400 [IU]/h via INTRAVENOUS
  Filled 2016-10-17: qty 250

## 2016-10-17 MED ORDER — DOXERCALCIFEROL 4 MCG/2ML IV SOLN
5.0000 ug | INTRAVENOUS | Status: DC
  Administered 2016-10-18: 5 ug via INTRAVENOUS

## 2016-10-17 NOTE — Progress Notes (Addendum)
Patient Name: [name] Date of Encounter: 10/17/2016  Principal Problem:   SVT (supraventricular tachycardia) ([name]) Active Problems:   Dementia   Anemia   Ischemic cardiomyopathy   CAD (coronary artery disease)   OSA (obstructive sleep apnea)   Headache   DM (diabetes mellitus) type II controlled with renal manifestation ([place])   Transient hypotension   ESRD on hemodialysis ([place])   Length of Stay: 1  SUBJECTIVE  He feels better today. No chest pain, headache has resolved.  CURRENT MEDS . allopurinol  100 mg Oral Daily  . aspirin EC  81 mg Oral Daily  . cinacalcet  90 mg Oral BID WC  . citalopram  10 mg Oral Once per day on Mon Wed Fri  . clopidogrel  75 mg Oral Q breakfast  . colesevelam  1,250 mg Oral BID WC  . ezetimibe  10 mg Oral QHS  . gabapentin  600 mg Oral QHS  . insulin aspart  0-9 Units Subcutaneous TID WC  . lanthanum  2,000 mg Oral BID WC  . [START ON 10/18/2016] midodrine  10 mg Oral Q T,Th,Sa-HD  . multivitamin  1 tablet Oral QHS  . pantoprazole  40 mg Oral BID    OBJECTIVE  Vitals:   10/17/16 0200 10/17/16 0340 10/17/16 0400 10/17/16 0953  BP:  (!) 116/58 90/63 118/68  Pulse: 75 81 78 85  Resp: 13 (!) 21 15 (!) 21  Temp:  98.1 F (36.7 C)    TempSrc:  Oral    SpO2: 99% 98% 97% 99%  Weight:      Height:        Intake/Output Summary (Last 24 hours) at 10/17/16 1119 Last data filed at 10/17/16 0600  Gross per 24 hour  Intake             2704 ml  Output                0 ml  Net             2704 ml   Filed Weights   10/17/16 0133  Weight: 241 lb 2.9 oz (109.4 kg)    PHYSICAL EXAM  General: Pleasant, NAD. Neuro: Alert and oriented X 3. Moves all extremities spontaneously. Psych: Normal affect. HEENT:  Normal  Neck: Supple without bruits or JVD. Lungs:  Resp regular and unlabored, CTA. Heart: RRR no s3, s4, or murmurs. Abdomen: Soft, non-tender, non-distended, BS + x 4.  Extremities: No clubbing, cyanosis or edema.  DP/PT/Radials 2+ and equal bilaterally.  Accessory Clinical Findings  CBC  Recent Labs  10/16/16 1704  10/16/16 2232 10/17/16 0125  WBC 10.1  --  8.6 8.4  NEUTROABS 6.5  --   --   --   HGB 10.3*  < > 9.1* 8.7*  HCT 33.7*  < > 29.4* 28.2*  MCV 99.7  --  99.7 100.0  PLT 267  --  257 263  < > = values in this interval not displayed. Basic Metabolic Panel  Recent Labs  10/16/16 1704 10/16/16 1726 10/16/16 2232 10/17/16 0125  NA 136 137  --  137  K 4.3 4.3  --  4.3  CL 97* 99*  --  101  CO2 22  --   --  23  GLUCOSE 166* 170*  --  72  BUN 20 28*  --  25*  CREATININE 6.61* 6.80* 7.05* 7.19*  CALCIUM 8.4*  --   --  8.1*  MG 2.0  --   --   --  Liver Function Tests  Recent Labs  10/16/16 1704  AST 37  ALT 26  ALKPHOS 101  BILITOT 0.6  PROT 7.2  ALBUMIN 3.5    Recent Labs  10/16/16 2232 10/17/16 0101 10/17/16 0125  TROPONINI 0.98* 2.74* 2.67*    Recent Labs  10/16/16 2231  TSH 0.141*    Radiology/Studies  Ct Head Wo Contrast  Result Date: 10/16/2016 CLINICAL DATA:  Headache and blurry vision weakness after dialysis EXAM: CT HEAD WITHOUT CONTRAST TECHNIQUE: Contiguous axial images were obtained from the base of the skull through the vertex without intravenous contrast. COMPARISON:  10/25/2009 FINDINGS: Brain: No evidence of acute infarction, hemorrhage, hydrocephalus, extra-axial collection or mass lesion/mass effect. Vascular: No hyperdense vessels.  Carotid artery calcifications. Skull: No fracture.  Mastoid air cells clear. Sinuses/Orbits: Mild mucosal thickening in the sphenoid and ethmoid sinuses. No acute orbital abnormality. Other: None IMPRESSION: No CT evidence for acute intracranial abnormality. Electronically Signed   By: Donavan Foil M.D.   On: 10/16/2016 18:06   Ct Angio Neck W Or Wo Contrast  Result Date: 10/17/2016 CLINICAL DATA:  Headache and blurred vision.  Dialysis patient  IMPRESSION: Atherosclerotic disease at the carotid bifurcation  bilaterally without significant stenosis. Moderate to severe stenosis in the cavernous carotid bilaterally due to extensive atherosclerotic calcification. Mild stenosis at the origin of the left vertebral artery. Severe stenosis of the distal right vertebral artery and mild stenosis distal left vertebral artery No large vessel occlusion. Electronically Signed   By: Franchot Gallo M.D.   On: 10/17/2016 08:53   Dg Chest Port 1 View  Result Date: 10/16/2016 CLINICAL DATA:  Completed dialysis, hypotensive, headache EXAM: PORTABLE CHEST 1 VIEW COMPARISON:  08/04/2016 FINDINGS: Partially visualized cervical spine hardware. Low lung volumes. Mild cardiomegaly with mild central vascular congestion. No acute consolidation. Cannot exclude tiny left effusion. Atherosclerosis of the aorta. No pneumothorax. IMPRESSION: 1. Low lung volumes 2. Cardiomegaly with mild central vascular congestion 3. Possible tiny left pleural effusion Electronically Signed   By: Donavan Foil M.D.   On: 10/16/2016 18:28   TELE: SR    ASSESSMENT AND PLAN  SVT ESRD on HD NSTEMI CAD, s/p   66 year old male with h/o hypertension, hyperlipidemia, ESRD on dialysis (T-Th-Sat), dementia and diabetes, CAD, s/p NSTEMI, PCI/stenting to LAD in 08/2016.  He developed headache at HD that was terminated early, came to ER, he was found to be in SVT with rate 140-160 BPM, cardioverted on amiodarone iv. His was BP low in 80-90'. After cardioversion his headache worsened with blurry vision. Also retrosternal chest pain that he rates as mild. Troponin overnight positive, now 2.74 --> 2.67, I have reviewed his cath with Dr [name], he has tight prox to mid RCA lesion that might be challenging to intervene on as the vessel is tortuous. Discussed with Dr Martinique who will cath him today. I will discontinue Heparin drip. The patient agrees. I have reviewed his echocardiogram, previously LVEF 55% in 12/2014 now 40-45% with diffuse hypokinesis.  He  remains in SR, we will discontinue amiodarone.  Signed, Ena Dawley MD, [facility] 10/17/2016

## 2016-10-17 NOTE — Progress Notes (Signed)
PROGRESS NOTE    [name]  YJE:[id number] DOB: 1951/01/14 DOA: 10/16/2016 PCP: [name], MD    Brief Narrative: [name] is a 66 y.o. male with history of ESRD on hemodialysis on Tuesday Thursdays and Saturday, CAD status post stenting in November 2017, diabetes mellitus type 2, history of gout was brought to the ER from dialysis center after patient was found to be hypotensive and complained of headache. CT of head was unremarkable. In the ER patient was found to be in SVT and hypotensive. Patient was started on amiodarone bolus and infusion. Cardiology was consulted and patient's rhythm converted to sinus after starting amiodarone. Patient denies any chest pain or shortness of breath but did have some pain around his neck area along with the headache. Headache is mostly frontal with some blurring of vision which has improved after Toradol. Patient will be admitted for further management of SVT and headache.   Assessment & Plan:   Principal Problem:   SVT (supraventricular tachycardia) ([name]) Active Problems:   Dementia   Anemia   Ischemic cardiomyopathy   CAD (coronary artery disease)   OSA (obstructive sleep apnea)   Headache   DM (diabetes mellitus) type II controlled with renal manifestation ([name])   Transient hypotension   ESRD on hemodialysis ([place])  1-SVT; cardiology consulted. Received IV amiodarone.  Amiodarone discontinue by cardio.  Resolved.  ECHO Ef 40%.  Elevation of troponin; cardiology following.  Heparin discontinue.   2-Low TSH; Free T 4;1.0, free T 3 pending.  3-Headache; headaches resolved. ESR elevated but patient denies further headaches. No vision changes.  CTA with severe  stenosis of distal vertebral artery. Ct reviewed with neurology on call, no further intervention needs. Treatment for cholesterol.   4-CAD status post stenting in November 2017 - on Plavix and aspirin and patient is also on WelChol and Zetia. Intolerant to statins.   5-ESRD  on hemodialysis on Tuesday Thursday since Saturday -  Had HD prior to admission yesterday.  Nephrology consulted.   6-Diabetes mellitus type 2 hold Prandin - . sliding scale coverage.  7-Chronic anemia probably from ESRD - follow CBC. Hb trending down, report black stool few weeks ago.  Check iron and ferritin.   8-Sleep apnea on CPAP.  9-Hypotension; on chronic midodrine prior to HD.   DVT prophylaxis: scd, he report bleeding history with heparin. He does not take heparin during HD.   Code Status: Full code.  Family Communication: son who was at bedside.  Disposition Plan: remain inpatient   Consultants:   Cardiology  Nephrology    Procedures:   none   Antimicrobials: none   Subjective: He report feeling better, denies dyspnea. Denies headaches and any vision changes.  He is still feeling weak and tired. He has chronic left sid weakness for more than 20 years.   Objective: Vitals:   10/17/16 0133 10/17/16 0200 10/17/16 0340 10/17/16 0400  BP: 97/62  (!) 116/58 90/63  Pulse: 79 75 81 78  Resp: 12 13 (!) 21 15  Temp:   98.1 F (36.7 C)   TempSrc:   Oral   SpO2: 99% 99% 98% 97%  Weight: 109.4 kg (241 lb 2.9 oz)     Height: 6' (1.829 m)       Intake/Output Summary (Last 24 hours) at 10/17/16 0854 Last data filed at 10/17/16 0600  Gross per 24 hour  Intake             2704 ml  Output  0 ml  Net             2704 ml   Filed Weights   10/17/16 0133  Weight: 109.4 kg (241 lb 2.9 oz)    Examination:  General exam: Appears calm and comfortable  Respiratory system: Clear to auscultation. Respiratory effort normal. Cardiovascular system: S1 & S2 heard, RRR. No JVD, murmurs, rubs, gallops or clicks. No pedal edema. Gastrointestinal system: Abdomen is nondistended, soft and nontender. No organomegaly or masses felt. Normal bowel sounds heard. Central nervous system: Alert and oriented. No focal neurological deficits. Extremities: Symmetric 5 x 5  power. Skin: No rashes, lesions or ulcers Psychiatry: Judgement and insight appear normal. Mood & affect appropriate.     Data Reviewed: I have personally reviewed following labs and imaging studies  CBC:  Recent Labs Lab 10/16/16 1704 10/16/16 1726 10/16/16 2232 10/17/16 0125  WBC 10.1  --  8.6 8.4  NEUTROABS 6.5  --   --   --   HGB 10.3* 10.2* 9.1* 8.7*  HCT 33.7* 30.0* 29.4* 28.2*  MCV 99.7  --  99.7 100.0  PLT 267  --  257 300   Basic Metabolic Panel:  Recent Labs Lab 10/16/16 1704 10/16/16 1726 10/16/16 2232 10/17/16 0125  NA 136 137  --  137  K 4.3 4.3  --  4.3  CL 97* 99*  --  101  CO2 22  --   --  23  GLUCOSE 166* 170*  --  72  BUN 20 28*  --  25*  CREATININE 6.61* 6.80* 7.05* 7.19*  CALCIUM 8.4*  --   --  8.1*  MG 2.0  --   --   --    GFR: Estimated Creatinine Clearance: 12.7 mL/min (by C-G formula based on SCr of 7.19 mg/dL (H)). Liver Function Tests:  Recent Labs Lab 10/16/16 1704  AST 37  ALT 26  ALKPHOS 101  BILITOT 0.6  PROT 7.2  ALBUMIN 3.5   No results for input(s): LIPASE, AMYLASE in the last 168 hours. No results for input(s): AMMONIA in the last 168 hours. Coagulation Profile:  Recent Labs Lab 10/16/16 1704  INR 0.88   Cardiac Enzymes:  Recent Labs Lab 10/16/16 2232 10/17/16 0101 10/17/16 0125  TROPONINI 0.98* 2.74* 2.67*   BNP (last 3 results) No results for input(s): PROBNP in the last 8760 hours. HbA1C: No results for input(s): HGBA1C in the last 72 hours. CBG:  Recent Labs Lab 10/15/16 1331 10/15/16 1425  GLUCAP 158* 144*   Lipid Profile: No results for input(s): CHOL, HDL, LDLCALC, TRIG, CHOLHDL, LDLDIRECT in the last 72 hours. Thyroid Function Tests:  Recent Labs  10/16/16 2231  TSH 0.141*   Anemia Panel: No results for input(s): VITAMINB12, FOLATE, FERRITIN, TIBC, IRON, RETICCTPCT in the last 72 hours. Sepsis Labs:  Recent Labs Lab 10/16/16 1727  LATICACIDVEN 3.20*    Recent Results (from  the past 240 hour(s))  MRSA PCR Screening     Status: None   Collection Time: 10/17/16  1:32 AM  Result Value Ref Range Status   MRSA by PCR NEGATIVE NEGATIVE Final    Comment:        The GeneXpert MRSA Assay (FDA approved for NASAL specimens only), is one component of a comprehensive MRSA colonization surveillance program. It is not intended to diagnose MRSA infection nor to guide or monitor treatment for MRSA infections.          Radiology Studies: Ct Head Wo Contrast  Result  Date: 10/16/2016 CLINICAL DATA:  Headache and blurry vision weakness after dialysis EXAM: CT HEAD WITHOUT CONTRAST TECHNIQUE: Contiguous axial images were obtained from the base of the skull through the vertex without intravenous contrast. COMPARISON:  10/25/2009 FINDINGS: Brain: No evidence of acute infarction, hemorrhage, hydrocephalus, extra-axial collection or mass lesion/mass effect. Vascular: No hyperdense vessels.  Carotid artery calcifications. Skull: No fracture.  Mastoid air cells clear. Sinuses/Orbits: Mild mucosal thickening in the sphenoid and ethmoid sinuses. No acute orbital abnormality. Other: None IMPRESSION: No CT evidence for acute intracranial abnormality. Electronically Signed   By: Donavan Foil M.D.   On: 10/16/2016 18:06   Dg Chest Port 1 View  Result Date: 10/16/2016 CLINICAL DATA:  Completed dialysis, hypotensive, headache EXAM: PORTABLE CHEST 1 VIEW COMPARISON:  08/04/2016 FINDINGS: Partially visualized cervical spine hardware. Low lung volumes. Mild cardiomegaly with mild central vascular congestion. No acute consolidation. Cannot exclude tiny left effusion. Atherosclerosis of the aorta. No pneumothorax. IMPRESSION: 1. Low lung volumes 2. Cardiomegaly with mild central vascular congestion 3. Possible tiny left pleural effusion Electronically Signed   By: Donavan Foil M.D.   On: 10/16/2016 18:28        Scheduled Meds: . allopurinol  100 mg Oral Daily  . aspirin EC  81 mg Oral  Daily  . cinacalcet  90 mg Oral BID WC  . citalopram  10 mg Oral Once per day on Mon Wed Fri  . clopidogrel  75 mg Oral Q breakfast  . colesevelam  1,250 mg Oral BID WC  . ezetimibe  10 mg Oral QHS  . gabapentin  600 mg Oral QHS  . insulin aspart  0-9 Units Subcutaneous TID WC  . lanthanum  2,000 mg Oral BID WC  . [START ON 10/18/2016] midodrine  10 mg Oral Q T,Th,Sa-HD  . multivitamin  1 tablet Oral QHS  . pantoprazole  40 mg Oral BID  . repaglinide  0.5 mg Oral Q breakfast   Continuous Infusions: . amiodarone 30 mg/hr (10/17/16 0700)  . heparin Stopped (10/17/16 0517)     LOS: 1 day    Time spent: 35 minutes.     Elmarie Shiley, MD [facility] Pager [phone]  If 7PM-7AM, please contact night-coverage [email or web address] Password [facility] 10/17/2016, 8:54 AM

## 2016-10-17 NOTE — Progress Notes (Signed)
MD paged of troponin 2.67

## 2016-10-17 NOTE — Progress Notes (Signed)
  Echocardiogram 2D Echocardiogram has been performed.  Jennette Dubin 10/17/2016, 9:23 AM

## 2016-10-17 NOTE — Progress Notes (Signed)
ANTICOAGULATION CONSULT NOTE - Initial Consult  Pharmacy Consult for heparin Indication: chest pain/ACS  Allergies  Allergen Reactions  . Cephalexin Swelling and Other (See Comments)    Tongue swelling  . Statins Other (See Comments)    Weak muscles  . Ciprofloxacin Rash    Patient Measurements: Height: 6' (182.9 cm) Weight: 241 lb 2.9 oz (109.4 kg) IBW/kg (Calculated) : 77.6 Heparin Dosing Weight: 100kg  Vital Signs: Temp: 97.9 F (36.6 C) (01/16 1636) Temp Source: Oral (01/16 1636) BP: 97/62 (01/17 0133) Pulse Rate: 75 (01/17 0200)  Labs:  Recent Labs  10/16/16 1704 10/16/16 1726 10/16/16 2232 10/17/16 0101 10/17/16 0125  HGB 10.3* 10.2* 9.1*  --  8.7*  HCT 33.7* 30.0* 29.4*  --  28.2*  PLT 267  --  257  --  263  LABPROT 11.9  --   --   --   --   INR 0.88  --   --   --   --   CREATININE 6.61* 6.80* 7.05*  --  7.19*  TROPONINI  --   --  0.98* 2.74* 2.67*    Estimated Creatinine Clearance: 12.7 mL/min (by C-G formula based on SCr of 7.19 mg/dL (H)).   Medical History: Past Medical History:  Diagnosis Date  . Allergic rhinitis, cause unspecified 02/24/2014  . Anemia 06/16/2011  . BENIGN PROSTATIC HYPERTROPHY 10/14/2009  . CAD, NATIVE VESSEL 02/06/2009   saw Dr. Missy Sabins last jan 2013  . Cervical radiculopathy, chronic 02/23/2016   Right c5-6 by NCS/EMG  . CHEST PAIN 03/29/2010  . COLONIC POLYPS, HX OF 10/14/2009  . CONGESTIVE HEART FAILURE 06/18/2007  . Dementia [id number]  . DEPRESSION 10/14/2009  . Depression 09/24/2015  . DIABETES MELLITUS, TYPE II 02/01/2010  . DIASTOLIC HEART FAILURE, CHRONIC 02/06/2009  . DIZZINESS 07/17/2010  . DYSLIPIDEMIA 06/18/2007  . DYSPNEA 10/29/2008  . ESRD (end stage renal disease) on dialysis ([place]) 08/04/2010   "TTS;  " (04/18/2015)  . FOOT PAIN 08/12/2008  . GAIT DISTURBANCE 03/03/2010  . GASTROENTERITIS, VIRAL 10/14/2009  . GERD 06/18/2007  . GOITER, MULTINODULAR 12/26/2007  . GOUT 06/18/2007  . GYNECOMASTIA 07/17/2010  .  Hemodialysis access, fistula mature [facility])    Dialysis T-Th-Sa ([place]) Right upper arm fistula  . Hyperlipidemia 10/16/2011  . Hyperparathyroidism, secondary ([place]) 06/16/2011  . HYPERTENSION 06/18/2007  . Hyperthyroidism   . HYPERTHYROIDISM 02/02/2010  . Hypocalcemia 06/07/2010  . Ischemic cardiomyopathy 06/16/2011  . NECK PAIN 07/31/2010  . NSTEMI (non-ST elevated myocardial infarction) ([place]) 08/04/2016  . ONYCHOMYCOSIS, TOENAILS 12/26/2007  . OSA on CPAP 10/16/2011  . Other malaise and fatigue 11/24/2009  . PERIPHERAL NEUROPATHY 06/18/2007  . Prostate cancer ([place])   . PULMONARY NODULE, RIGHT LOWER LOBE 06/08/2009  . Renal insufficiency   . Sleep apnea    cpap machine and o2  . TRANSAMINASES, SERUM, ELEVATED 02/01/2010  . Transfusion history    none recent  . Unspecified hypotension 01/30/2010    Medications:  Prescriptions Prior to Admission  Medication Sig Dispense Refill Last Dose  . acetaminophen (TYLENOL) 325 MG tablet Take 650 mg by mouth every 6 (six) hours as needed for mild pain or headache.    PRN at PRN  . allopurinol (ZYLOPRIM) 100 MG tablet Take 1 tablet (100 mg total) by mouth daily. 90 tablet 3 10/15/2016 at pm  . aspirin 81 MG tablet Take 81 mg by mouth daily.   10/16/2016 at 1000  . citalopram (CELEXA) 10 MG tablet Take 10 mg by  mouth 3 (three) times a week.   Past Week at Unknown time  . clopidogrel (PLAVIX) 75 MG tablet Take 1 tablet (75 mg total) by mouth daily with breakfast. 30 tablet 6 10/16/2016 at 1000  . colesevelam (WELCHOL) 625 MG tablet TAKE THREE TABLETS BY MOUTH TWICE DAILY WITH MEALS (Patient taking differently: Take 1,250 mg by mouth 2 (two) times daily with a meal. ) 180 tablet 11 10/16/2016 at am  . ezetimibe (ZETIA) 10 MG tablet Take 1 tablet (10 mg total) by mouth daily. (Patient taking differently: Take 10 mg by mouth at bedtime. ) 90 tablet 3 10/15/2016 at pm  . gabapentin (NEURONTIN) 300 MG capsule 1-2 tabs by mouth at bedtime for sleep and  pain (Patient taking differently: Take 600 mg by mouth at bedtime. ) 180 capsule 3 10/15/2016 at pm  . Lanthanum Carbonate 1000 MG PACK Take 2 packets by mouth 2 (two) times daily.   10/16/2016 at am  . midodrine (PROAMATINE) 10 MG tablet Take 10 mg by mouth See admin instructions. Once a day only on dialysis days (Tues/Thurs/Sat)   10/16/2016 at am  . multivitamin (RENA-VIT) TABS tablet Take 1 tablet by mouth at bedtime. Reported on 10/24/2015   10/15/2016 at pm  . pantoprazole (PROTONIX) 40 MG tablet Take 40 mg by mouth 2 (two) times daily.    10/16/2016 at am  . repaglinide (PRANDIN) 0.5 MG tablet Take 1 tablet (0.5 mg total) by mouth daily with breakfast. 30 tablet 11 10/16/2016 at 1000  . SENSIPAR 90 MG tablet Take 90 mg by mouth 2 (two) times daily.   10/16/2016 at 1000   Scheduled:  . allopurinol  100 mg Oral Daily  . aspirin EC  81 mg Oral Daily  . cinacalcet  90 mg Oral BID WC  . citalopram  10 mg Oral Once per day on Mon Wed Fri  . clopidogrel  75 mg Oral Q breakfast  . colesevelam  1,250 mg Oral BID WC  . ezetimibe  10 mg Oral QHS  . gabapentin  600 mg Oral QHS  . heparin  5,000 Units Subcutaneous Q8H  . insulin aspart  0-9 Units Subcutaneous TID WC  . lanthanum  2,000 mg Oral BID WC  . [START ON 10/18/2016] midodrine  10 mg Oral Q T,Th,Sa-HD  . multivitamin  1 tablet Oral QHS  . pantoprazole  40 mg Oral BID  . repaglinide  0.5 mg Oral Q breakfast   Infusions:  . amiodarone 30 mg/hr (10/17/16 0031)    Assessment: 66yo male c/o CP, found to be in SVT, converted to NSR w/ brief bursts of SVT, initial troponin negative but now increasing, to begin heparin.  Goal of Therapy:  Heparin level 0.3-0.7 units/ml Monitor platelets by anticoagulation protocol: Yes   Plan:  Rec'd heparin SQ 1hr ago; will start heparin gtt at 1400 units/hr and monitor heparin levels and CBC.  Wynona Neat, PharmD, BCPS  10/17/2016,2:29 AM

## 2016-10-17 NOTE — Consult Note (Signed)
[facility] Renal Consultation Note  Indication for Consultation:  Management of ESRD/hemodialysis; anemia, hypertension/volume and secondary hyperparathyroidism  HPI: [name] is a 66 y.o. male with ESRD sec DM  ( chronic HD TTS  [facility] ) Ischm CM / CAD sp Stenting Nov 2017/ Dr.Bensimhon follows , Ho hypotension on Midodrine 5 mg pre hd (per pt dose  )developed a severe Headache and Hypotension about 2 hrs into HD ( seen at beginning of  HD tx yesterday by Dr. Mercy Moore  And stable) BP Pre hd 168/94 and 85/50 at end . Sent to ER for eval / found to be in SVT and hypotensive  With normal CT hd .Started on  amiodarone bolus and infusion and  rhythm converted to sinus after starting amiodarone. Cardiology was consulted. Reports Headache is mostly frontal with some blurring of vision and improved after Toradol.  Patient denies any chest pain or shortness of breath but did have some pain around his neck area along with the headache. Noted he has not made his edw (105.5 kg ) past 3 TX s at HD  With post wts 108kg yest / 106.3  And 105.9 kg prior txs.  We are consulted for esrd / HD issues .   [name]   Past Medical History:  Diagnosis Date  . Allergic rhinitis, cause unspecified 02/24/2014  . Anemia 06/16/2011  . BENIGN PROSTATIC HYPERTROPHY 10/14/2009  . CAD, NATIVE VESSEL 02/06/2009   saw Dr. Missy Sabins last jan 2013  . Cervical radiculopathy, chronic 02/23/2016   Right c5-6 by NCS/EMG  . CHEST PAIN 03/29/2010  . COLONIC POLYPS, HX OF 10/14/2009  . CONGESTIVE HEART FAILURE 06/18/2007  . Dementia [id number]  . DEPRESSION 10/14/2009  . Depression 09/24/2015  . DIABETES MELLITUS, TYPE II 02/01/2010  . DIASTOLIC HEART FAILURE, CHRONIC 02/06/2009  . DIZZINESS 07/17/2010  . DYSLIPIDEMIA 06/18/2007  . DYSPNEA 10/29/2008  . ESRD (end stage renal disease) on dialysis ([place]) 08/04/2010   "TTS;  " (04/18/2015)  . FOOT PAIN 08/12/2008  . GAIT DISTURBANCE 03/03/2010  . GASTROENTERITIS, VIRAL  10/14/2009  . GERD 06/18/2007  . GOITER, MULTINODULAR 12/26/2007  . GOUT 06/18/2007  . GYNECOMASTIA 07/17/2010  . Hemodialysis access, fistula mature [facility])    Dialysis T-Th-Sa ([place]) Right upper arm fistula  . Hyperlipidemia 10/16/2011  . Hyperparathyroidism, secondary ([place]) 06/16/2011  . HYPERTENSION 06/18/2007  . Hyperthyroidism   . HYPERTHYROIDISM 02/02/2010  . Hypocalcemia 06/07/2010  . Ischemic cardiomyopathy 06/16/2011  . NECK PAIN 07/31/2010  . NSTEMI (non-ST elevated myocardial infarction) ([place]) 08/04/2016  . ONYCHOMYCOSIS, TOENAILS 12/26/2007  . OSA on CPAP 10/16/2011  . Other malaise and fatigue 11/24/2009  . PERIPHERAL NEUROPATHY 06/18/2007  . Prostate cancer ([place])   . PULMONARY NODULE, RIGHT LOWER LOBE 06/08/2009  . Renal insufficiency   . Sleep apnea    cpap machine and o2  . TRANSAMINASES, SERUM, ELEVATED 02/01/2010  . Transfusion history    none recent  . Unspecified hypotension 01/30/2010    Past Surgical History:  Procedure Laterality Date  . ARTERIOVENOUS GRAFT PLACEMENT Right 2009   forearm/notes 02/01/2011  . AV FISTULA PLACEMENT  11/07/2011   Procedure: INSERTION OF ARTERIOVENOUS (AV) GORE-TEX GRAFT ARM;  Surgeon: Tinnie Gens, MD;  Location: [place];  Service: Vascular;  Laterality: Left;  . BACK SURGERY  1998  . BASCILIC VEIN TRANSPOSITION Right 02/27/2013   Procedure: BASCILIC VEIN TRANSPOSITION;  Surgeon: Mal Misty, MD;  Location: [place];  Service: Vascular;  Laterality:  Right;  Right Basilic Vein Transposition   . CARDIAC CATHETERIZATION N/A 08/06/2016   Procedure: Left Heart Cath and Coronary Angiography;  Surgeon: Jolaine Artist, MD;  Location: [place] CV LAB;  Service: Cardiovascular;  Laterality: N/A;  . CARDIAC CATHETERIZATION N/A 08/07/2016   Procedure: Coronary/Graft Atherectomy-CSI LAD;  Surgeon: Peter M Martinique, MD;  Location: [place] CV LAB;  Service: Cardiovascular;  Laterality: N/A;  . CERVICAL SPINE SURGERY  2/09   "to repair  nerve problems in my left arm"  . CHOLECYSTECTOMY    . CORONARY ANGIOPLASTY WITH STENT PLACEMENT  06/11/2008  . CORONARY ANGIOPLASTY WITH STENT PLACEMENT  06/2007   TAXUS stent to RCA/notes 01/31/2011  . ESOPHAGOGASTRODUODENOSCOPY  09/28/2011   Procedure: ESOPHAGOGASTRODUODENOSCOPY (EGD);  Surgeon: Missy Sabins, MD;  Location: [facility] ENDOSCOPY;  Service: Endoscopy;  Laterality: N/A;  . ESOPHAGOGASTRODUODENOSCOPY N/A 04/07/2015   Procedure: ESOPHAGOGASTRODUODENOSCOPY (EGD);  Surgeon: Teena Irani, MD;  Location: [facility];  Service: Endoscopy;  Laterality: N/A;  . ESOPHAGOGASTRODUODENOSCOPY N/A 04/19/2015   Procedure: ESOPHAGOGASTRODUODENOSCOPY (EGD);  Surgeon: Arta Silence, MD;  Location: [facility] ENDOSCOPY;  Service: Endoscopy;  Laterality: N/A;  . FOREIGN BODY REMOVAL  09/2003   via upper endoscopy/notes 02/12/2011  . GIVENS CAPSULE STUDY  09/30/2011   Procedure: GIVENS CAPSULE STUDY;  Surgeon: Jeryl Columbia, MD;  Location: [facility] ENDOSCOPY;  Service: Endoscopy;  Laterality: N/A;  . INSERTION OF DIALYSIS CATHETER Right 2014  . INSERTION OF DIALYSIS CATHETER Left 02/11/2013   Procedure: INSERTION OF DIALYSIS CATHETER;  Surgeon: Conrad [place], MD;  Location: [place];  Service: Vascular;  Laterality: Left;  Ultrasound guided  . REMOVAL OF A DIALYSIS CATHETER Right 02/11/2013   Procedure: REMOVAL OF A DIALYSIS CATHETER;  Surgeon: Conrad [place]ville, MD;  Location: [place];  Service: Vascular;  Laterality: Right;  . SAVORY DILATION N/A 04/07/2015   Procedure: SAVORY DILATION;  Surgeon: Teena Irani, MD;  Location: [facility] ENDOSCOPY;  Service: Endoscopy;  Laterality: N/A;  . SHUNTOGRAM N/A 09/20/2011   Procedure: Earney Mallet;  Surgeon: Conrad Blackhawk, MD;  Location: [facility] CATH LAB;  Service: Cardiovascular;  Laterality: N/A;  . TONSILLECTOMY    . TOTAL KNEE ARTHROPLASTY Right 08/02/2015   Procedure: TOTAL KNEE ARTHROPLASTY;  Surgeon: Renette Butters, MD;  Location: [place];  Service: Orthopedics;  Laterality: Right;  . VENOGRAM N/A 01/26/2013    Procedure: VENOGRAM;  Surgeon: Angelia Mould, MD;  Location: [facility] CATH LAB;  Service: Cardiovascular;  Laterality: N/A;      Family History  Problem Relation Age of Onset  . Heart disease Sister   . Thyroid nodules Sister   . Heart disease Father   . Diabetes Father   . Kidney failure Father   . Hypertension Father   . Healthy Child   . Healthy Child   . Healthy Child   . Cancer Neg Hx       reports that he quit smoking about 11 years ago. His smoking use included Cigarettes. He has a 25.00 pack-year smoking history. He quit smokeless tobacco use about 11 years ago. He reports that he does not drink alcohol or use drugs.   Allergies  Allergen Reactions  . Cephalexin Swelling and Other (See Comments)    Tongue swelling  . Statins Other (See Comments)    Weak muscles  . Ciprofloxacin Rash    Prior to Admission medications   Medication Sig Start Date End Date Taking? Authorizing Provider  acetaminophen (TYLENOL) 325 MG tablet Take 650 mg by  mouth every 6 (six) hours as needed for mild pain or headache.    Yes Historical Provider, MD  allopurinol (ZYLOPRIM) 100 MG tablet Take 1 tablet (100 mg total) by mouth daily. 07/20/16  Yes Biagio Borg, MD  aspirin 81 MG tablet Take 81 mg by mouth daily.   Yes Historical Provider, MD  citalopram (CELEXA) 10 MG tablet Take 10 mg by mouth 3 (three) times a week.   Yes Historical Provider, MD  clopidogrel (PLAVIX) 75 MG tablet Take 1 tablet (75 mg total) by mouth daily with breakfast. 09/05/16  Yes Jolaine Artist, MD  colesevelam (WELCHOL) 625 MG tablet TAKE THREE TABLETS BY MOUTH TWICE DAILY WITH MEALS Patient taking differently: Take 1,250 mg by mouth 2 (two) times daily with a meal.  07/20/16  Yes Biagio Borg, MD  ezetimibe (ZETIA) 10 MG tablet Take 1 tablet (10 mg total) by mouth daily. Patient taking differently: Take 10 mg by mouth at bedtime.  03/15/16  Yes Jolaine Artist, MD  gabapentin (NEURONTIN) 300 MG capsule 1-2  tabs by mouth at bedtime for sleep and pain Patient taking differently: Take 600 mg by mouth at bedtime.  07/20/16  Yes Biagio Borg, MD  Lanthanum Carbonate 1000 MG PACK Take 2 packets by mouth 2 (two) times daily.   Yes Historical Provider, MD  midodrine (PROAMATINE) 10 MG tablet Take 10 mg by mouth See admin instructions. Once a day only on dialysis days (Tues/Thurs/Sat) 08/27/16  Yes Historical Provider, MD  multivitamin (RENA-VIT) TABS tablet Take 1 tablet by mouth at bedtime. Reported on 10/24/2015   Yes Historical Provider, MD  pantoprazole (PROTONIX) 40 MG tablet Take 40 mg by mouth 2 (two) times daily.  08/31/16  Yes Historical Provider, MD  repaglinide (PRANDIN) 0.5 MG tablet Take 1 tablet (0.5 mg total) by mouth daily with breakfast. 08/15/16  Yes Biagio Borg, MD  SENSIPAR 90 MG tablet Take 90 mg by mouth 2 (two) times daily. 09/18/16  Yes Historical Provider, MD     Anti-infectives    None      Results for orders placed or performed during the hospital encounter of 10/16/16 (from the past 48 hour(s))  CBC with Differential     Status: Abnormal   Collection Time: 10/16/16  5:04 PM  Result Value Ref Range   WBC 10.1 4.0 - 10.5 K/uL   RBC 3.38 (L) 4.22 - 5.81 MIL/uL   Hemoglobin 10.3 (L) 13.0 - 17.0 g/dL   HCT 33.7 (L) 39.0 - 52.0 %   MCV 99.7 78.0 - 100.0 fL   MCH 30.5 26.0 - 34.0 pg   MCHC 30.6 30.0 - 36.0 g/dL   RDW 19.4 (H) 11.5 - 15.5 %   Platelets 267 150 - 400 K/uL   Neutrophils Relative % 65 %   Neutro Abs 6.5 1.7 - 7.7 K/uL   Lymphocytes Relative 24 %   Lymphs Abs 2.4 0.7 - 4.0 K/uL   Monocytes Relative 10 %   Monocytes Absolute 1.1 (H) 0.1 - 1.0 K/uL   Eosinophils Relative 1 %   Eosinophils Absolute 0.1 0.0 - 0.7 K/uL   Basophils Relative 0 %   Basophils Absolute 0.0 0.0 - 0.1 K/uL  Comprehensive metabolic panel     Status: Abnormal   Collection Time: 10/16/16  5:04 PM  Result Value Ref Range   Sodium 136 135 - 145 mmol/L   Potassium 4.3 3.5 - 5.1 mmol/L     Comment: SLIGHT HEMOLYSIS  Chloride 97 (L) 101 - 111 mmol/L   CO2 22 22 - 32 mmol/L   Glucose, Bld 166 (H) 65 - 99 mg/dL   BUN 20 6 - 20 mg/dL   Creatinine, Ser 6.61 (H) 0.61 - 1.24 mg/dL   Calcium 8.4 (L) 8.9 - 10.3 mg/dL   Total Protein 7.2 6.5 - 8.1 g/dL   Albumin 3.5 3.5 - 5.0 g/dL   AST 37 15 - 41 U/L   ALT 26 17 - 63 U/L   Alkaline Phosphatase 101 38 - 126 U/L   Total Bilirubin 0.6 0.3 - 1.2 mg/dL   GFR calc non Af Amer 8 (L) >60 mL/min   GFR calc Af Amer 9 (L) >60 mL/min    Comment: (NOTE) The eGFR has been calculated using the CKD EPI equation. This calculation has not been validated in all clinical situations. eGFR's persistently <60 mL/min signify possible Chronic Kidney Disease.    Anion gap 17 (H) 5 - 15  Magnesium     Status: None   Collection Time: 10/16/16  5:04 PM  Result Value Ref Range   Magnesium 2.0 1.7 - 2.4 mg/dL  Protime-INR     Status: None   Collection Time: 10/16/16  5:04 PM  Result Value Ref Range   Prothrombin Time 11.9 11.4 - 15.2 seconds   INR 0.88   I-Stat Troponin, ED (not at [facility])     Status: None   Collection Time: 10/16/16  5:25 PM  Result Value Ref Range   Troponin i, poc 0.03 0.00 - 0.08 ng/mL   Comment 3            Comment: Due to the release kinetics of cTnI, a negative result within the first hours of the onset of symptoms does not rule out myocardial infarction with certainty. If myocardial infarction is still suspected, repeat the test at appropriate intervals.   I-Stat Chem 8, ED     Status: Abnormal   Collection Time: 10/16/16  5:26 PM  Result Value Ref Range   Sodium 137 135 - 145 mmol/L   Potassium 4.3 3.5 - 5.1 mmol/L   Chloride 99 (L) 101 - 111 mmol/L   BUN 28 (H) 6 - 20 mg/dL   Creatinine, Ser 6.80 (H) 0.61 - 1.24 mg/dL   Glucose, Bld 170 (H) 65 - 99 mg/dL   Calcium, Ion 0.93 (L) 1.15 - 1.40 mmol/L   TCO2 29 0 - 100 mmol/L   Hemoglobin 10.2 (L) 13.0 - 17.0 g/dL   HCT 30.0 (L) 39.0 - 52.0 %  I-Stat Venous Blood  Gas, ED (order at [facility] and [facility] only)     Status: Abnormal   Collection Time: 10/16/16  5:26 PM  Result Value Ref Range   pH, Ven 7.378 7.250 - 7.430   pCO2, Ven 50.9 44.0 - 60.0 mmHg   pO2, Ven 45.0 32.0 - 45.0 mmHg   Bicarbonate 30.0 (H) 20.0 - 28.0 mmol/L   TCO2 32 0 - 100 mmol/L   O2 Saturation 79.0 %   Acid-Base Excess 4.0 (H) 0.0 - 2.0 mmol/L   Patient temperature HIDE    Sample type VENOUS   I-Stat CG4 Lactic Acid, ED     Status: Abnormal   Collection Time: 10/16/16  5:27 PM  Result Value Ref Range   Lactic Acid, Venous 3.20 (HH) 0.5 - 1.9 mmol/L   Comment NOTIFIED PHYSICIAN   TSH     Status: Abnormal   Collection Time: 10/16/16 10:31 PM  Result  Value Ref Range   TSH 0.141 (L) 0.350 - 4.500 uIU/mL    Comment: Performed by a 3rd Generation assay with a functional sensitivity of <=0.01 uIU/mL.  Troponin I (q 6hr x 3)     Status: Abnormal   Collection Time: 10/16/16 10:32 PM  Result Value Ref Range   Troponin I 0.98 (HH) <0.03 ng/mL    Comment: CRITICAL RESULT CALLED TO, READ BACK BY AND VERIFIED WITH: SHANAS,B RN 10/16/2016 2359 JORDANS   CBC     Status: Abnormal   Collection Time: 10/16/16 10:32 PM  Result Value Ref Range   WBC 8.6 4.0 - 10.5 K/uL   RBC 2.95 (L) 4.22 - 5.81 MIL/uL   Hemoglobin 9.1 (L) 13.0 - 17.0 g/dL   HCT 29.4 (L) 39.0 - 52.0 %   MCV 99.7 78.0 - 100.0 fL   MCH 30.8 26.0 - 34.0 pg   MCHC 31.0 30.0 - 36.0 g/dL   RDW 19.4 (H) 11.5 - 15.5 %   Platelets 257 150 - 400 K/uL  Creatinine, serum     Status: Abnormal   Collection Time: 10/16/16 10:32 PM  Result Value Ref Range   Creatinine, Ser 7.05 (H) 0.61 - 1.24 mg/dL   GFR calc non Af Amer 7 (L) >60 mL/min   GFR calc Af Amer 8 (L) >60 mL/min    Comment: (NOTE) The eGFR has been calculated using the CKD EPI equation. This calculation has not been validated in all clinical situations. eGFR's persistently <60 mL/min signify possible Chronic Kidney Disease.   Sedimentation rate     Status: Abnormal    Collection Time: 10/16/16 10:32 PM  Result Value Ref Range   Sed Rate 128 (H) 0 - 16 mm/hr  Troponin I (q 6hr x 3)     Status: Abnormal   Collection Time: 10/17/16  1:01 AM  Result Value Ref Range   Troponin I 2.74 (HH) <0.03 ng/mL    Comment: CRITICAL VALUE NOTED.  VALUE IS CONSISTENT WITH PREVIOUSLY REPORTED AND CALLED VALUE.  Troponin I (q 6hr x 3)     Status: Abnormal   Collection Time: 10/17/16  1:25 AM  Result Value Ref Range   Troponin I 2.67 (HH) <0.03 ng/mL    Comment: CRITICAL VALUE NOTED.  VALUE IS CONSISTENT WITH PREVIOUSLY REPORTED AND CALLED VALUE.  Basic metabolic panel     Status: Abnormal   Collection Time: 10/17/16  1:25 AM  Result Value Ref Range   Sodium 137 135 - 145 mmol/L   Potassium 4.3 3.5 - 5.1 mmol/L   Chloride 101 101 - 111 mmol/L   CO2 23 22 - 32 mmol/L   Glucose, Bld 72 65 - 99 mg/dL   BUN 25 (H) 6 - 20 mg/dL   Creatinine, Ser 7.19 (H) 0.61 - 1.24 mg/dL   Calcium 8.1 (L) 8.9 - 10.3 mg/dL   GFR calc non Af Amer 7 (L) >60 mL/min   GFR calc Af Amer 8 (L) >60 mL/min    Comment: (NOTE) The eGFR has been calculated using the CKD EPI equation. This calculation has not been validated in all clinical situations. eGFR's persistently <60 mL/min signify possible Chronic Kidney Disease.    Anion gap 13 5 - 15  CBC     Status: Abnormal   Collection Time: 10/17/16  1:25 AM  Result Value Ref Range   WBC 8.4 4.0 - 10.5 K/uL   RBC 2.82 (L) 4.22 - 5.81 MIL/uL   Hemoglobin 8.7 (L) 13.0 -  17.0 g/dL   HCT 28.2 (L) 39.0 - 52.0 %   MCV 100.0 78.0 - 100.0 fL   MCH 30.9 26.0 - 34.0 pg   MCHC 30.9 30.0 - 36.0 g/dL   RDW 19.5 (H) 11.5 - 15.5 %   Platelets 263 150 - 400 K/uL  MRSA PCR Screening     Status: None   Collection Time: 10/17/16  1:32 AM  Result Value Ref Range   MRSA by PCR NEGATIVE NEGATIVE    Comment:        The GeneXpert MRSA Assay (FDA approved for NASAL specimens only), is one component of a comprehensive MRSA colonization surveillance program.  It is not intended to diagnose MRSA infection nor to guide or monitor treatment for MRSA infections.   Influenza panel by PCR (type A & B)     Status: None   Collection Time: 10/17/16  5:30 AM  Result Value Ref Range   Influenza A By PCR NEGATIVE NEGATIVE   Influenza B By PCR NEGATIVE NEGATIVE    Comment: (NOTE) The Xpert Xpress Flu assay is intended as an aid in the diagnosis of  influenza and should not be used as a sole basis for treatment.  This  assay is FDA approved for nasopharyngeal swab specimens only. Nasal  washings and aspirates are unacceptable for Xpert Xpress Flu testing.   Glucose, capillary     Status: Abnormal   Collection Time: 10/17/16  9:44 AM  Result Value Ref Range   Glucose-Capillary 101 (H) 65 - 99 mg/dL  Heparin level (unfractionated)     Status: None   Collection Time: 10/17/16 11:03 AM  Result Value Ref Range   Heparin Unfractionated 0.30 0.30 - 0.70 IU/mL    Comment:        IF HEPARIN RESULTS ARE BELOW EXPECTED VALUES, AND PATIENT DOSAGE HAS BEEN CONFIRMED, SUGGEST FOLLOW UP TESTING OF ANTITHROMBIN III LEVELS.   T4, free     Status: None   Collection Time: 10/17/16 11:03 AM  Result Value Ref Range   Free T4 1.00 0.61 - 1.12 ng/dL    Comment: (NOTE) Biotin ingestion may interfere with free T4 tests. If the results are inconsistent with the TSH level, previous test results, or the clinical presentation, then consider biotin interference. If needed, order repeat testing after stopping biotin.   Lactic acid, plasma     Status: None   Collection Time: 10/17/16 11:03 AM  Result Value Ref Range   Lactic Acid, Venous 1.2 0.5 - 1.9 mmol/L  Glucose, capillary     Status: Abnormal   Collection Time: 10/17/16 12:20 PM  Result Value Ref Range   Glucose-Capillary 110 (H) 65 - 99 mg/dL     ROS:  As above in HPI  , in his normal state of health with symptoms started 2 hours into HD.   Physical Exam: Vitals:   10/17/16 0953 10/17/16 1221  BP:  118/68 99/63  Pulse: 85 78  Resp: (!) 21   Temp:  97.8 F (36.6 C)     General: alert Bearded  AAM NAD, Ox3 Pleasant  HEENT: [place], Anicteric. MMM   Neck: supple /full neck beard Heart: RRR , no mur, rub, or gal Lungs: CTA  Non labored btreathing  Abdomen: obese, soft , NT, ND  Extremities: no pedal edema  Skin: warm dry  No overt rash Neuro: OX4  Alert no acute focal deficits /moves all extrem to request Dialysis Access: pos bruit RUA AVF  Dialysis Orders: Center:  [facility]   on TTS . EDW 105.5 kg HD Bath 2k, 2ca  Time 4hr Heparin none. Access RUA AVF     Hec5 mcg IV/HD   Mircera 134mg last given 10/09/16 q 2wks    Other op labs  hgb 9.0   Ca 8.8 phos 5.7 pth 459   Assessment/Plan 1. ESRD -  HD TTS  ,HD in am   2. Hypotension/volume  - bp stable now ,CXR  On admit showing no excess volume ,Pt reports on 530m Midodrine  Pre hd >will make 1011mPre hd  And raise edw to 106.5 kg  With no excess vol on exam or admit cxr  3. SVT- converted to SR  Now , rx per cards 4. CAD/ ho Ischemic CM  -  TX per cards /  Had 2d echo today = prior study 01/12/2015 LVEF decreased now to 40-45% previously 55%,  and basal inferior hyokin  5. Anemia  - hgb 8.7   Last esa 1/09 next due 10/23/16  6. Metabolic bone disease -  Iv hec on hd  And binder=  Fosrenol  7. OSA - on Cpap 8. DM type 2 - per admit  DavErnest HaberA-C [phone]/2018, 3:25 PM

## 2016-10-18 DIAGNOSIS — I255 Ischemic cardiomyopathy: Secondary | ICD-10-CM

## 2016-10-18 LAB — LIPID PANEL
CHOLESTEROL: 268 mg/dL — AB (ref 0–200)
HDL: 46 mg/dL (ref >40)
LDL Cholesterol: 170 mg/dL — ABNORMAL HIGH (ref 0–99)
Total CHOL/HDL Ratio: 5.8 RATIO
Triglycerides: 262 mg/dL — ABNORMAL HIGH (ref <150)
VLDL: 52 mg/dL — ABNORMAL HIGH (ref 0–40)

## 2016-10-18 LAB — RENAL FUNCTION PANEL
ALBUMIN: 3.2 g/dL — AB (ref 3.5–5.0)
Anion gap: 13 (ref 5–15)
BUN: 44 mg/dL — AB (ref 6–20)
CALCIUM: 8 mg/dL — AB (ref 8.9–10.3)
CHLORIDE: 102 mmol/L (ref 101–111)
CO2: 23 mmol/L (ref 22–32)
CREATININE: 10.64 mg/dL — AB (ref 0.61–1.24)
GFR, EST AFRICAN AMERICAN: 5 mL/min — AB (ref >60)
GFR, EST NON AFRICAN AMERICAN: 4 mL/min — AB (ref >60)
Glucose, Bld: 93 mg/dL (ref 65–99)
PHOSPHORUS: 6.5 mg/dL — AB (ref 2.5–4.6)
Potassium: 5.7 mmol/L — ABNORMAL HIGH (ref 3.5–5.1)
SODIUM: 138 mmol/L (ref 135–145)

## 2016-10-18 LAB — CBC
HCT: 27.8 % — ABNORMAL LOW (ref 39.0–52.0)
HEMOGLOBIN: 8.5 g/dL — AB (ref 13.0–17.0)
MCH: 30.2 pg (ref 26.0–34.0)
MCHC: 30.6 g/dL (ref 30.0–36.0)
MCV: 98.9 fL (ref 78.0–100.0)
PLATELETS: 237 10*3/uL (ref 150–400)
RBC: 2.81 MIL/uL — AB (ref 4.22–5.81)
RDW: 19 % — ABNORMAL HIGH (ref 11.5–15.5)
WBC: 6.9 10*3/uL (ref 4.0–10.5)

## 2016-10-18 LAB — GLUCOSE, CAPILLARY
GLUCOSE-CAPILLARY: 85 mg/dL (ref 65–99)
Glucose-Capillary: 95 mg/dL (ref 65–99)

## 2016-10-18 LAB — OCCULT BLOOD X 1 CARD TO LAB, STOOL: Fecal Occult Bld: POSITIVE — AB

## 2016-10-18 MED ORDER — SODIUM CHLORIDE 0.9 % IV SOLN
250.0000 mg | INTRAVENOUS | Status: DC
  Administered 2016-10-18: 250 mg via INTRAVENOUS
  Filled 2016-10-18: qty 20

## 2016-10-18 MED ORDER — MIDODRINE HCL 10 MG PO TABS: 10.0000 mg | ORAL_TABLET | ORAL | 0 refills | Status: DC

## 2016-10-18 MED ORDER — SODIUM CHLORIDE 0.9 % IV SOLN
125.0000 mg | Freq: Once | INTRAVENOUS | Status: DC
  Filled 2016-10-18 (×2): qty 10

## 2016-10-18 MED ORDER — SODIUM CHLORIDE 0.9 % IV SOLN
510.0000 mg | Freq: Once | INTRAVENOUS | Status: DC
  Filled 2016-10-18: qty 17

## 2016-10-18 MED ORDER — DOXERCALCIFEROL 4 MCG/2ML IV SOLN
INTRAVENOUS | Status: AC
  Filled 2016-10-18: qty 4

## 2016-10-18 MED ORDER — MIDODRINE HCL 5 MG PO TABS
ORAL_TABLET | ORAL | Status: AC
  Filled 2016-10-18: qty 2

## 2016-10-18 MED ORDER — SODIUM CHLORIDE 0.9 % IV SOLN: 250.0000 mg | INTRAVENOUS | Status: DC

## 2016-10-18 NOTE — Progress Notes (Signed)
Pt transported to dialysis for treatment.

## 2016-10-18 NOTE — Procedures (Signed)
Patient was seen on dialysis and the procedure was supervised.  BFR 400  Via AVF BP is  146/83.   Patient appears to be tolerating treatment well  [name] A 10/18/2016

## 2016-10-18 NOTE — Progress Notes (Signed)
Patient Name: [name] Date of Encounter: 10/18/2016  Principal Problem:   SVT (supraventricular tachycardia) ([name]) Active Problems:   Dementia   Anemia   Ischemic cardiomyopathy   CAD (coronary artery disease)   OSA (obstructive sleep apnea)   Headache   DM (diabetes mellitus) type II controlled with renal manifestation ([place])   Transient hypotension   ESRD on hemodialysis ([place])   Length of Stay: 2  SUBJECTIVE  He feels better today. No chest pain, headache has resolved.   CURRENT MEDS . allopurinol  100 mg Oral Daily  . aspirin EC  81 mg Oral Daily  . cinacalcet  90 mg Oral BID WC  . citalopram  10 mg Oral Once per day on Mon Wed Fri  . clopidogrel  75 mg Oral Q breakfast  . colesevelam  1,250 mg Oral BID WC  . doxercalciferol  5 mcg Intravenous Q T,Th,Sa-HD  . ezetimibe  10 mg Oral QHS  . ferumoxytol  510 mg Intravenous Once  . gabapentin  600 mg Oral QHS  . insulin aspart  0-9 Units Subcutaneous TID WC  . lanthanum  2,000 mg Oral BID WC  . midodrine  10 mg Oral Q T,Th,Sa-HD  . multivitamin  1 tablet Oral QHS  . pantoprazole  40 mg Oral BID    OBJECTIVE  Vitals:   10/17/16 1800 10/17/16 2000 10/17/16 2300 10/18/16 0300  BP: 131/70 (!) 151/71 118/72 (!) 148/77  Pulse: 78 82 78 88  Resp:    (!) 23  Temp:  98.5 F (36.9 C) 98.2 F (36.8 C) 97.9 F (36.6 C)  TempSrc:  Oral Oral Oral  SpO2: 94% 98% 94% 97%  Weight:    242 lb (109.8 kg)  Height:        Intake/Output Summary (Last 24 hours) at 10/18/16 1148 Last data filed at 10/18/16 0800  Gross per 24 hour  Intake            548.8 ml  Output                0 ml  Net            548.8 ml   Filed Weights   10/17/16 0133 10/18/16 0300  Weight: 241 lb 2.9 oz (109.4 kg) 242 lb (109.8 kg)    PHYSICAL EXAM  General: Pleasant, NAD. Neuro: Alert and oriented X 3. Moves all extremities spontaneously. Psych: Normal affect. HEENT:  Normal  Neck: Supple without bruits or JVD. Lungs:  Resp regular  and unlabored, CTA. Heart: RRR no s3, s4, or murmurs. Abdomen: Soft, non-tender, non-distended, BS + x 4.  Extremities: No clubbing, cyanosis or edema. DP/PT/Radials 2+ and equal bilaterally.  Accessory Clinical Findings  CBC  Recent Labs  10/16/16 1704  10/17/16 0125 10/18/16 0758  WBC 10.1  < > 8.4 6.9  NEUTROABS 6.5  --   --   --   HGB 10.3*  < > 8.7* 8.5*  HCT 33.7*  < > 28.2* 27.8*  MCV 99.7  < > 100.0 98.9  PLT 267  < > 263 237  < > = values in this interval not displayed. Basic Metabolic Panel  Recent Labs  10/16/16 1704 10/16/16 1726 10/16/16 2232 10/17/16 0125  NA 136 137  --  137  K 4.3 4.3  --  4.3  CL 97* 99*  --  101  CO2 22  --   --  23  GLUCOSE 166* 170*  --  72  BUN 20 28*  --  25*  CREATININE 6.61* 6.80* 7.05* 7.19*  CALCIUM 8.4*  --   --  8.1*  MG 2.0  --   --   --    Liver Function Tests  Recent Labs  10/16/16 1704  AST 37  ALT 26  ALKPHOS 101  BILITOT 0.6  PROT 7.2  ALBUMIN 3.5    Recent Labs  10/16/16 2232 10/17/16 0101 10/17/16 0125  TROPONINI 0.98* 2.74* 2.67*    Recent Labs  10/16/16 2231  TSH 0.141*    Radiology/Studies  Ct Head Wo Contrast  Result Date: 10/16/2016 CLINICAL DATA:  Headache and blurry vision weakness after dialysis EXAM: CT HEAD WITHOUT CONTRAST TECHNIQUE: Contiguous axial images were obtained from the base of the skull through the vertex without intravenous contrast. COMPARISON:  10/25/2009 FINDINGS: Brain: No evidence of acute infarction, hemorrhage, hydrocephalus, extra-axial collection or mass lesion/mass effect. Vascular: No hyperdense vessels.  Carotid artery calcifications. Skull: No fracture.  Mastoid air cells clear. Sinuses/Orbits: Mild mucosal thickening in the sphenoid and ethmoid sinuses. No acute orbital abnormality. Other: None IMPRESSION: No CT evidence for acute intracranial abnormality. Electronically Signed   By: Donavan Foil M.D.   On: 10/16/2016 18:06   Ct Angio Neck W Or Wo  Contrast  Result Date: 10/17/2016 CLINICAL DATA:  Headache and blurred vision.  Dialysis patient  IMPRESSION: Atherosclerotic disease at the carotid bifurcation bilaterally without significant stenosis. Moderate to severe stenosis in the cavernous carotid bilaterally due to extensive atherosclerotic calcification. Mild stenosis at the origin of the left vertebral artery. Severe stenosis of the distal right vertebral artery and mild stenosis distal left vertebral artery No large vessel occlusion. Electronically Signed   By: Franchot Gallo M.D.   On: 10/17/2016 08:53   Dg Chest Port 1 View  Result Date: 10/16/2016 CLINICAL DATA:  Completed dialysis, hypotensive, headache EXAM: PORTABLE CHEST 1 VIEW COMPARISON:  08/04/2016 FINDINGS: Partially visualized cervical spine hardware. Low lung volumes. Mild cardiomegaly with mild central vascular congestion. No acute consolidation. Cannot exclude tiny left effusion. Atherosclerosis of the aorta. No pneumothorax. IMPRESSION: 1. Low lung volumes 2. Cardiomegaly with mild central vascular congestion 3. Possible tiny left pleural effusion Electronically Signed   By: Donavan Foil M.D.   On: 10/16/2016 18:28   TELE: SR    ASSESSMENT AND PLAN  SVT ESRD on HD NSTEMI CAD, s/p   66 year old male with h/o hypertension, hyperlipidemia, ESRD on dialysis (T-Th-Sat), dementia and diabetes, CAD, s/p NSTEMI, PCI/stenting to LAD in 08/2016.  He developed headache at HD that was terminated early, came to ER, he was found to be in SVT with rate 140-160 BPM, cardioverted on amiodarone iv. His was BP low in 80-90'. After cardioversion his headache worsened with blurry vision. Also retrosternal chest pain that he rates as mild. Troponin overnight positive, now 2.74 --> 2.67, I have reviewed his cath with Dr [name], he has tight prox to mid RCA lesion that might be challenging to intervene on as the vessel is tortuous. Discussed with Dr Martin[place] who will cath him today. I  will discontinue Heparin drip. The patient agrees. I have reviewed his echocardiogram, previously LVEF 55% in 12/2014 now 40-45% with diffuse hypokinesis.  He remains in SR, we discontinued amiodarone. His Hb is low, he has chronic anemia in 10' range sec to ESRD on HD,  However now 8.5-8.7. Dr Eston Mould is ordering iv iron infusion, his fecal occult blood is positive.  I would recommend  to recheck Hb in 7-10 days and if not improving or dropping schedule a GI consult for EGD. We could potentially hold Plavix 3 months post PCI = on November 07 2016 for EGD. We will arrange for an outpatient follow up.   Signed, Ena Dawley MD, [facility] 10/18/2016

## 2016-10-18 NOTE — Discharge Summary (Signed)
Physician Discharge Summary  [name] VZC:[id number] DOB: Oct 22, 1950 DOA: 10/16/2016  PCP: [name], MD  Admit date: 10/16/2016 Discharge date: 10/18/2016  Admitted From: Home  Disposition: Home   Recommendations for Outpatient Follow-up:  1. Follow up with PCP in 1-2 weeks 2. Please obtain BMP/CBC in one week 3. Please follow up on the following pending results:Free T 3. He will need repeat Thyroid function test.  4. Please repeat hb, if lower he will need GI evaluation.  5. Needs further counseling regarding diet, he is intolerant to statins and his cholesterol is still elevated.     Discharge Condition: Stable.  CODE STATUS: Full code.  Diet recommendation: Heart Healthy   Brief/Interim Summary: [name] a 65 y.o.malewith history of ESRD on hemodialysis on Tuesday Thursdays and Saturday, CAD status post stenting in November 2017, diabetes mellitus type 2, history of gout was brought to the ER from dialysis center afterpatient was found to be hypotensive and complained of headache. CT of head was unremarkable. In the ER patient was found to be in SVT and hypotensive. Patient was started on amiodarone bolus and infusion. Cardiology was consulted and patient's rhythm converted to sinus after starting amiodarone. Patient denies any chest pain or shortness of breath but did have some pain around his neck area along with theheadache. Headache is mostly frontal with some blurring of vision which has improved after Toradol. Patient will be admitted for further management of SVT and headache.   Assessment & Plan:    1-SVT; cardiology consulted. Received IV amiodarone.  Amiodarone discontinue by cardio.  Resolved.  ECHO Ef 40%.  Elevation of troponin; cardiology following.  no plan for cath due to complex anatomy. Plan for medical management.  Heparin discontinue.    2-Low TSH; Free T 4;1.0 normal, free T 3 pending. He will need repeat labs.   3-Headache; headaches  resolved. ESR elevated but patient denies further headaches. No vision changes.  CTA with severe  stenosis of distal vertebral artery. Ct reviewed with neurology on call, no further intervention needs. Treatment for cholesterol.   4-CAD status post stenting in November 2017- on Plavix and aspirin and patient is also on WelChol and Zetia. Intolerant to statins.   5-ESRD on hemodialysis on Tuesday Thursday since Saturday -  Had HD prior to admission yesterday.  Nephrology consulted.   6-Diabetes mellitus type 2 hold Prandin - . sliding scale coverage.  7-Chronic anemiaprobably from ESRD - follow CBC. Hb trending down, report black stool few weeks ago.  Hb stable at 8.5. Will give IV iron. He denies melena currently. Needs close follow up. I advised him to get medical attention if he develops melena.    8-Sleep apneaon CPAP.  9-Hypotension; on chronic midodrine prior to HD.  10-HLD; continue with Zetia, whelcol. Intolerant to statins. Needs continue counseling on diet.   Discharge Diagnoses:  Principal Problem:   SVT (supraventricular tachycardia) ([name]) Active Problems:   Dementia   Anemia   Ischemic cardiomyopathy   CAD (coronary artery disease)   OSA (obstructive sleep apnea)   Headache   DM (diabetes mellitus) type II controlled with renal manifestation ([name])   Transient hypotension   ESRD on hemodialysis [facility])    Discharge Instructions  Discharge Instructions    Diet - low sodium heart healthy    Complete by:  As directed    Increase activity slowly    Complete by:  As directed      Allergies as of 10/18/2016  Reactions   Cephalexin Swelling, Other (See Comments)   Tongue swelling   Statins Other (See Comments)   Weak muscles   Ciprofloxacin Rash      Medication List    TAKE these medications   acetaminophen 325 MG tablet Commonly known as:  TYLENOL Take 650 mg by mouth every 6 (six) hours as needed for mild pain or headache.   allopurinol  100 MG tablet Commonly known as:  ZYLOPRIM Take 1 tablet (100 mg total) by mouth daily.   aspirin 81 MG tablet Take 81 mg by mouth daily.   citalopram 10 MG tablet Commonly known as:  CELEXA Take 10 mg by mouth 3 (three) times a week.   clopidogrel 75 MG tablet Commonly known as:  PLAVIX Take 1 tablet (75 mg total) by mouth daily with breakfast.   colesevelam 625 MG tablet Commonly known as:  WELCHOL TAKE THREE TABLETS BY MOUTH TWICE DAILY WITH MEALS What changed:  how much to take  how to take this  when to take this  additional instructions   ezetimibe 10 MG tablet Commonly known as:  ZETIA Take 1 tablet (10 mg total) by mouth daily. What changed:  when to take this   gabapentin 300 MG capsule Commonly known as:  NEURONTIN 1-2 tabs by mouth at bedtime for sleep and pain What changed:  how much to take  how to take this  when to take this  additional instructions   Lanthanum Carbonate 1000 MG Pack Take 2 packets by mouth 2 (two) times daily.   midodrine 10 MG tablet Commonly known as:  PROAMATINE Take 1 tablet (10 mg total) by mouth See admin instructions. Once a day only on dialysis days (Tues/Thurs/Sat)   multivitamin Tabs tablet Take 1 tablet by mouth at bedtime. Reported on 10/24/2015   pantoprazole 40 MG tablet Commonly known as:  PROTONIX Take 40 mg by mouth 2 (two) times daily.   repaglinide 0.5 MG tablet Commonly known as:  PRANDIN Take 1 tablet (0.5 mg total) by mouth daily with breakfast.   SENSIPAR 90 MG tablet Generic drug:  cinacalcet Take 90 mg by mouth 2 (two) times daily.      Follow-up Information    Glori Bickers, MD Follow up on 10/24/2016.   Specialty:  Cardiology Why:  at 1:40PM. Contact information: [address] [place] [address] [phone]          Allergies  Allergen Reactions  . Cephalexin Swelling and Other (See Comments)    Tongue swelling  . Statins Other (See Comments)     Weak muscles  . Ciprofloxacin Rash    Consultations:  Cardiology  Nephrology    Procedures/Studies: Ct Angio Head W Or Wo Contrast  Result Date: 10/17/2016 CLINICAL DATA:  Headache and blurred vision.  Dialysis patient EXAM: CT ANGIOGRAPHY HEAD AND NECK TECHNIQUE: Multidetector CT imaging of the head and neck was performed using the standard protocol during bolus administration of intravenous contrast. Multiplanar CT image reconstructions and MIPs were obtained to evaluate the vascular anatomy. Carotid stenosis measurements (when applicable) are obtained utilizing NASCET criteria, using the distal internal carotid diameter as the denominator. CONTRAST:  50 mL Isovue 370 IV COMPARISON:  CT head 10/16/2016 FINDINGS: CTA NECK FINDINGS Aortic arch: Atherosclerotic calcification throughout the aortic arch without dissection or aneurysm. Mild atherosclerotic disease in the proximal great vessels which are patent. Right carotid system: Mild diffuse atherosclerotic disease in the right common carotid artery. Atherosclerotic calcification proximal right  internal carotid artery without significant stenosis. Left carotid system: Diffuse atherosclerotic disease left common carotid artery without stenosis. Calcified and noncalcified plaque left carotid bulb. Less than 25% diameter stenosis left internal carotid artery. Left external carotid artery widely patent. Vertebral arteries: Mild stenosis proximal left vertebral artery. Atherosclerotic plaque and calcification distal vertebral artery bilaterally with severe stenosis distal right vertebral artery and mild stenosis distal left vertebral artery. Skeleton: ACDF C3 through C6. C6 screw on the right has backed out approximately 1 cm unchanged from 06/02/2014. No acute skeletal abnormality. Other neck: 12 mm right thyroid nodule likely a cyst. Small calcification left thyroid. Upper chest: Lung apices clear. Review of the MIP images confirms the above findings CTA  HEAD FINDINGS Anterior circulation: Extensive atherosclerotic calcification in the cavernous carotid bilaterally causing moderate to severe luminal stenosis bilaterally. Anterior and middle cerebral arteries patent bilaterally without significant stenosis. Mild irregularity of M1 and M2 segments compatible with mild atherosclerotic disease. Posterior circulation: Both vertebral arteries patent to the basilar. Severe calcific stenosis distal right vertebral artery at the level of the dura. Mild stenosis distal left vertebral artery. The basilar is patent with mild atherosclerotic irregularity and no significant stenosis. PICA patent bilaterally. Superior cerebellar and posterior cerebral arteries patent bilaterally without significant stenosis. Venous sinuses: Patent Anatomic variants: None Delayed phase: Normal enhancement on delayed imaging. Review of the MIP images confirms the above findings IMPRESSION: Atherosclerotic disease at the carotid bifurcation bilaterally without significant stenosis. Moderate to severe stenosis in the cavernous carotid bilaterally due to extensive atherosclerotic calcification. Mild stenosis at the origin of the left vertebral artery. Severe stenosis of the distal right vertebral artery and mild stenosis distal left vertebral artery No large vessel occlusion. Electronically Signed   By: Franchot Gallo M.D.   On: 10/17/2016 08:53   Ct Head Wo Contrast  Result Date: 10/16/2016 CLINICAL DATA:  Headache and blurry vision weakness after dialysis EXAM: CT HEAD WITHOUT CONTRAST TECHNIQUE: Contiguous axial images were obtained from the base of the skull through the vertex without intravenous contrast. COMPARISON:  10/25/2009 FINDINGS: Brain: No evidence of acute infarction, hemorrhage, hydrocephalus, extra-axial collection or mass lesion/mass effect. Vascular: No hyperdense vessels.  Carotid artery calcifications. Skull: No fracture.  Mastoid air cells clear. Sinuses/Orbits: Mild mucosal  thickening in the sphenoid and ethmoid sinuses. No acute orbital abnormality. Other: None IMPRESSION: No CT evidence for acute intracranial abnormality. Electronically Signed   By: Donavan Foil M.D.   On: 10/16/2016 18:06   Ct Angio Neck W Or Wo Contrast  Result Date: 10/17/2016 CLINICAL DATA:  Headache and blurred vision.  Dialysis patient EXAM: CT ANGIOGRAPHY HEAD AND NECK TECHNIQUE: Multidetector CT imaging of the head and neck was performed using the standard protocol during bolus administration of intravenous contrast. Multiplanar CT image reconstructions and MIPs were obtained to evaluate the vascular anatomy. Carotid stenosis measurements (when applicable) are obtained utilizing NASCET criteria, using the distal internal carotid diameter as the denominator. CONTRAST:  50 mL Isovue 370 IV COMPARISON:  CT head 10/16/2016 FINDINGS: CTA NECK FINDINGS Aortic arch: Atherosclerotic calcification throughout the aortic arch without dissection or aneurysm. Mild atherosclerotic disease in the proximal great vessels which are patent. Right carotid system: Mild diffuse atherosclerotic disease in the right common carotid artery. Atherosclerotic calcification proximal right internal carotid artery without significant stenosis. Left carotid system: Diffuse atherosclerotic disease left common carotid artery without stenosis. Calcified and noncalcified plaque left carotid bulb. Less than 25% diameter stenosis left internal carotid artery. Left external carotid artery  widely patent. Vertebral arteries: Mild stenosis proximal left vertebral artery. Atherosclerotic plaque and calcification distal vertebral artery bilaterally with severe stenosis distal right vertebral artery and mild stenosis distal left vertebral artery. Skeleton: ACDF C3 through C6. C6 screw on the right has backed out approximately 1 cm unchanged from 06/02/2014. No acute skeletal abnormality. Other neck: 12 mm right thyroid nodule likely a cyst. Small  calcification left thyroid. Upper chest: Lung apices clear. Review of the MIP images confirms the above findings CTA HEAD FINDINGS Anterior circulation: Extensive atherosclerotic calcification in the cavernous carotid bilaterally causing moderate to severe luminal stenosis bilaterally. Anterior and middle cerebral arteries patent bilaterally without significant stenosis. Mild irregularity of M1 and M2 segments compatible with mild atherosclerotic disease. Posterior circulation: Both vertebral arteries patent to the basilar. Severe calcific stenosis distal right vertebral artery at the level of the dura. Mild stenosis distal left vertebral artery. The basilar is patent with mild atherosclerotic irregularity and no significant stenosis. PICA patent bilaterally. Superior cerebellar and posterior cerebral arteries patent bilaterally without significant stenosis. Venous sinuses: Patent Anatomic variants: None Delayed phase: Normal enhancement on delayed imaging. Review of the MIP images confirms the above findings IMPRESSION: Atherosclerotic disease at the carotid bifurcation bilaterally without significant stenosis. Moderate to severe stenosis in the cavernous carotid bilaterally due to extensive atherosclerotic calcification. Mild stenosis at the origin of the left vertebral artery. Severe stenosis of the distal right vertebral artery and mild stenosis distal left vertebral artery No large vessel occlusion. Electronically Signed   By: Franchot Gallo M.D.   On: 10/17/2016 08:53   Dg Chest Port 1 View  Result Date: 10/16/2016 CLINICAL DATA:  Completed dialysis, hypotensive, headache EXAM: PORTABLE CHEST 1 VIEW COMPARISON:  08/04/2016 FINDINGS: Partially visualized cervical spine hardware. Low lung volumes. Mild cardiomegaly with mild central vascular congestion. No acute consolidation. Cannot exclude tiny left effusion. Atherosclerosis of the aorta. No pneumothorax. IMPRESSION: 1. Low lung volumes 2. Cardiomegaly  with mild central vascular congestion 3. Possible tiny left pleural effusion Electronically Signed   By: Donavan Foil M.D.   On: 10/16/2016 18:28       Subjective: Patient seeing at HD unit. He is feeling well, feeling better.   Discharge Exam: Vitals:   10/18/16 1530 10/18/16 1644  BP: (!) 150/75 (!) 154/77  Pulse: 72 78  Resp:    Temp:  98.3 F (36.8 C)   Vitals:   10/18/16 1430 10/18/16 1500 10/18/16 1530 10/18/16 1644  BP: 140/84 (!) 146/83 (!) 150/75 (!) 154/77  Pulse: 75 78 72 78  Resp:      Temp:    98.3 F (36.8 C)  TempSrc:    Oral  SpO2:    95%  Weight:      Height:        General: Pt is alert, awake, not in acute distress Cardiovascular: RRR, S1/S2 +, no rubs, no gallops Respiratory: CTA bilaterally, no wheezing, no rhonchi Abdominal: Soft, NT, ND, bowel sounds + Extremities: no edema, no cyanosis    The results of significant diagnostics from this hospitalization (including imaging, microbiology, ancillary and laboratory) are listed below for reference.     Microbiology: Recent Results (from the past 240 hour(s))  Blood culture (routine x 2)     Status: None (Preliminary result)   Collection Time: 10/16/16 10:32 PM  Result Value Ref Range Status   Specimen Description BLOOD LEFT FOREARM  Final   Special Requests BOTTLES DRAWN AEROBIC AND ANAEROBIC 5CC EA  Final  Culture NO GROWTH 1 DAY  Final   Report Status PENDING  Incomplete  Blood culture (routine x 2)     Status: None (Preliminary result)   Collection Time: 10/16/16 11:30 PM  Result Value Ref Range Status   Specimen Description BLOOD LEFT UPPER ARM  Final   Special Requests BOTTLES DRAWN AEROBIC AND ANAEROBIC 5CC EA  Final   Culture NO GROWTH 1 DAY  Final   Report Status PENDING  Incomplete  MRSA PCR Screening     Status: None   Collection Time: 10/17/16  1:32 AM  Result Value Ref Range Status   MRSA by PCR NEGATIVE NEGATIVE Final    Comment:        The GeneXpert MRSA Assay  (FDA approved for NASAL specimens only), is one component of a comprehensive MRSA colonization surveillance program. It is not intended to diagnose MRSA infection nor to guide or monitor treatment for MRSA infections.      Labs: BNP (last 3 results) No results for input(s): BNP in the last 8760 hours. Basic Metabolic Panel:  Recent Labs Lab 10/16/16 1704 10/16/16 1726 10/16/16 2232 10/17/16 0125 10/18/16 0758  NA 136 137  --  137 138  K 4.3 4.3  --  4.3 5.7*  CL 97* 99*  --  101 102  CO2 22  --   --  23 23  GLUCOSE 166* 170*  --  72 93  BUN 20 28*  --  25* 44*  CREATININE 6.61* 6.80* 7.05* 7.19* 10.64*  CALCIUM 8.4*  --   --  8.1* 8.0*  MG 2.0  --   --   --   --   PHOS  --   --   --   --  6.5*   Liver Function Tests:  Recent Labs Lab 10/16/16 1704 10/18/16 0758  AST 37  --   ALT 26  --   ALKPHOS 101  --   BILITOT 0.6  --   PROT 7.2  --   ALBUMIN 3.5 3.2*   No results for input(s): LIPASE, AMYLASE in the last 168 hours. No results for input(s): AMMONIA in the last 168 hours. CBC:  Recent Labs Lab 10/16/16 1704 10/16/16 1726 10/16/16 2232 10/17/16 0125 10/18/16 0758  WBC 10.1  --  8.6 8.4 6.9  NEUTROABS 6.5  --   --   --   --   HGB 10.3* 10.2* 9.1* 8.7* 8.5*  HCT 33.7* 30.0* 29.4* 28.2* 27.8*  MCV 99.7  --  99.7 100.0 98.9  PLT 267  --  257 263 237   Cardiac Enzymes:  Recent Labs Lab 10/16/16 2232 10/17/16 0101 10/17/16 0125  TROPONINI 0.98* 2.74* 2.67*   BNP: Invalid input(s): POCBNP CBG:  Recent Labs Lab 10/17/16 1220 10/17/16 1659 10/17/16 2234 10/18/16 0845 10/18/16 1643  GLUCAP 110* 100* 131* 95 85   D-Dimer No results for input(s): DDIMER in the last 72 hours. Hgb A1c No results for input(s): HGBA1C in the last 72 hours. Lipid Profile  Recent Labs  10/18/16 0232  CHOL 268*  HDL 46  LDLCALC 170*  TRIG 262*  CHOLHDL 5.8   Thyroid function studies  Recent Labs  10/16/16 2231  TSH 0.141*   Anemia work  up  Recent Labs  10/17/16 1103  FERRITIN 709*  TIBC 246*  IRON 42*   Urinalysis    Component Value Date/Time   COLORURINE LT. YELLOW 06/26/2013 1200   APPEARANCEUR CLEAR 06/26/2013 1200   LABSPEC 1.020 06/26/2013  1200   PHURINE 8.5 06/26/2013 1200   GLUCOSEU 100 06/26/2013 1200   HGBUR MODERATE 06/26/2013 1200   BILIRUBINUR NEGATIVE 06/26/2013 1200   KETONESUR NEGATIVE 06/26/2013 1200   PROTEINUR >300 (A) 01/04/2010 1032   UROBILINOGEN 0.2 06/26/2013 1200   NITRITE NEGATIVE 06/26/2013 1200   LEUKOCYTESUR TRACE 06/26/2013 1200   Sepsis Labs Invalid input(s): PROCALCITONIN,  WBC,  LACTICIDVEN Microbiology Recent Results (from the past 240 hour(s))  Blood culture (routine x 2)     Status: None (Preliminary result)   Collection Time: 10/16/16 10:32 PM  Result Value Ref Range Status   Specimen Description BLOOD LEFT FOREARM  Final   Special Requests BOTTLES DRAWN AEROBIC AND ANAEROBIC 5CC EA  Final   Culture NO GROWTH 1 DAY  Final   Report Status PENDING  Incomplete  Blood culture (routine x 2)     Status: None (Preliminary result)   Collection Time: 10/16/16 11:30 PM  Result Value Ref Range Status   Specimen Description BLOOD LEFT UPPER ARM  Final   Special Requests BOTTLES DRAWN AEROBIC AND ANAEROBIC 5CC EA  Final   Culture NO GROWTH 1 DAY  Final   Report Status PENDING  Incomplete  MRSA PCR Screening     Status: None   Collection Time: 10/17/16  1:32 AM  Result Value Ref Range Status   MRSA by PCR NEGATIVE NEGATIVE Final    Comment:        The GeneXpert MRSA Assay (FDA approved for NASAL specimens only), is one component of a comprehensive MRSA colonization surveillance program. It is not intended to diagnose MRSA infection nor to guide or monitor treatment for MRSA infections.      Time coordinating discharge: Over 30 minutes  SIGNED:   Elmarie Shiley, MD  [facility] 10/18/2016, 4:49 PM Pager [phone]  If 7PM-7AM, please contact  night-coverage [email or web address] Password [email or web address]

## 2016-10-18 NOTE — Progress Notes (Signed)
[name] survivorship care plan for his h/o prostate cancer has been completed and mailed to his home, as requested by Shona Simpson, PA-C and the patient.    Letter attached with mailing encouraging patient to contact our office with questions or concerns.   Mike Craze, NP [place] [phone]

## 2016-10-19 ENCOUNTER — Ambulatory Visit: Payer: Medicare Other | Admitting: Endocrinology

## 2016-10-19 ENCOUNTER — Encounter (HOSPITAL_COMMUNITY): Payer: Medicare Other

## 2016-10-19 ENCOUNTER — Telehealth: Payer: Self-pay | Admitting: *Deleted

## 2016-10-19 LAB — T3, FREE: T3 FREE: 2.2 pg/mL (ref 2.0–4.4)

## 2016-10-19 NOTE — Telephone Encounter (Signed)
Called pt to set him up for TCM hosp f/u appt he states his wife takes care of all his appt, and she is not home. Will have her to call back to set up appt...[name]

## 2016-10-20 DIAGNOSIS — E119 Type 2 diabetes mellitus without complications: Secondary | ICD-10-CM | POA: Diagnosis not present

## 2016-10-20 DIAGNOSIS — D631 Anemia in chronic kidney disease: Secondary | ICD-10-CM | POA: Diagnosis not present

## 2016-10-20 DIAGNOSIS — N186 End stage renal disease: Secondary | ICD-10-CM | POA: Diagnosis not present

## 2016-10-20 DIAGNOSIS — N2581 Secondary hyperparathyroidism of renal origin: Secondary | ICD-10-CM | POA: Diagnosis not present

## 2016-10-21 NOTE — Progress Notes (Signed)
   Subjective:    Patient ID: [name], male    DOB: Feb 14, 1951, 66 y.o.   MRN: [id number]  HPI The state of at least three ongoing medical problems is addressed today: Pt returns for f/u of diabetes mellitus:  DM type: 2 Dx'ed: 4496 Complications: polyneuropathy, renal failure, and CAD.  Therapy: welchol DKA: never.  Severe hypoglycemia: never.  Pancreatitis: never.  Other: med needs have decreased with worsening of renal function; fructosamine converts to a1c approx 0.5% lower than a1c itself.   Interval history:  no cbg record, but states cbg's vary from 90-120.  There is no trend throughout the day.  He denies hypoglycemia.   Hyperthyroidism (due to a multinodular goiter; dx'ed 2009; bx then was benign; nuc med scan showed very low uptake, so tapazole was chosen as rx).  He has not recently taken tapazole.  He was recently in the hospital with SVT.    Review of Systems Denies fever    Objective:   Physical Exam VITAL SIGNS:  See vs page GENERAL: no distress.  Pulses: dorsalis pedis intact bilat.  MSK: no deformity of the feet.  CV: no leg edema.   Skin: no ulcer on the feet. normal color and temp on the feet.   Neuro: sensation is intact to touch on the feet, but decreased from normal.    A1c=5.5% Lab Results  Component Value Date   TSH 0.141 (L) 10/16/2016   T4TOTAL 5.6 07/19/2008   Lab Results  Component Value Date   CREATININE 9.00 (HH) 10/24/2016   BUN 30 (H) 10/24/2016   NA 143 10/24/2016   K 4.5 10/24/2016   CL 100 10/24/2016   CO2 27 10/24/2016      Assessment & Plan:  Type 2 DM, with CAD: well-controlled.  Please continue the same welchol. Renal failure: this limits DM oral rx options.  Hyperthyroidism, recurrent off rx.  SVT: caused or exac by hyperthyroidism.   Patient is advised the following: Patient Instructions  blood tests are requested for you today.  We'll let you know about the results. Please resume the methimazole.  I have sent  a prescription to your pharmacy. If ever you have fever while taking methimazole, stop it and call us, even if the reason is obvious, because of the risk of a rare side-effect. Please come back for a follow-up appointment in 4-6 weeks.

## 2016-10-22 ENCOUNTER — Telehealth (HOSPITAL_COMMUNITY): Payer: Self-pay | Admitting: Internal Medicine

## 2016-10-22 ENCOUNTER — Encounter (HOSPITAL_COMMUNITY): Admission: RE | Admit: 2016-10-22 | Payer: Medicare Other | Source: Ambulatory Visit

## 2016-10-22 LAB — CULTURE, BLOOD (ROUTINE X 2)
CULTURE: NO GROWTH
CULTURE: NO GROWTH

## 2016-10-22 NOTE — Telephone Encounter (Signed)
Transition Care Management Follow-up Telephone Call. Called wife to verify hosp f/u that was made, and completed TCM call below   Date discharged? 10/18/16   How have you been since you were released from the hospital? Wife states husband is doing ok   Do you understand why you were in the hospital? YES   Do you understand the discharge instructions? YES   Where were you discharged to? Home   Items Reviewed:  Medications reviewed: Yes  Allergies reviewed: Yes  Dietary changes reviewed: Yes, heart healthy  Referrals reviewed: No referral needed   Functional Questionnaire:   Activities of Daily Living (ADLs):   she states he are independent in the following: bathing and hygiene, feeding, continence, grooming, toileting and dressing States he require assistance with the following: ambulation   Any transportation issues/concerns?: No   Any patient concerns? No   Confirmed importance and date/time of follow-up visits scheduled Yes, wife made appt for 10/26/16  Provider Appointment booked with Dr. [name]  Confirmed with patient if condition begins to worsen call PCP or go to the ER.  Patient was given the office number and encouraged to call back with question or concerns.  : YES

## 2016-10-23 DIAGNOSIS — N2581 Secondary hyperparathyroidism of renal origin: Secondary | ICD-10-CM | POA: Diagnosis not present

## 2016-10-23 DIAGNOSIS — E119 Type 2 diabetes mellitus without complications: Secondary | ICD-10-CM | POA: Diagnosis not present

## 2016-10-23 DIAGNOSIS — N186 End stage renal disease: Secondary | ICD-10-CM | POA: Diagnosis not present

## 2016-10-23 DIAGNOSIS — D631 Anemia in chronic kidney disease: Secondary | ICD-10-CM | POA: Diagnosis not present

## 2016-10-24 ENCOUNTER — Ambulatory Visit (INDEPENDENT_AMBULATORY_CARE_PROVIDER_SITE_OTHER): Payer: Medicare Other | Admitting: Endocrinology

## 2016-10-24 ENCOUNTER — Encounter (HOSPITAL_COMMUNITY): Payer: Medicare Other

## 2016-10-24 ENCOUNTER — Encounter (HOSPITAL_COMMUNITY): Payer: Self-pay | Admitting: Internal Medicine

## 2016-10-24 ENCOUNTER — Ambulatory Visit (HOSPITAL_COMMUNITY)
Admission: RE | Admit: 2016-10-24 | Discharge: 2016-10-24 | Disposition: A | Discharge status: Home / Self Care | Payer: Medicare Other | Source: Ambulatory Visit | Attending: Internal Medicine | Admitting: Internal Medicine

## 2016-10-24 ENCOUNTER — Encounter: Payer: Self-pay | Admitting: Endocrinology

## 2016-10-24 VITALS — BP 132/62 | HR 91 | Wt 235.0 lb

## 2016-10-24 VITALS — BP 132/84 | HR 101 | Ht 73.0 in | Wt 235.0 lb

## 2016-10-24 DIAGNOSIS — Z992 Dependence on renal dialysis: Secondary | ICD-10-CM | POA: Diagnosis not present

## 2016-10-24 DIAGNOSIS — E119 Type 2 diabetes mellitus without complications: Secondary | ICD-10-CM | POA: Diagnosis not present

## 2016-10-24 DIAGNOSIS — I2510 Atherosclerotic heart disease of native coronary artery without angina pectoris: Secondary | ICD-10-CM | POA: Diagnosis not present

## 2016-10-24 DIAGNOSIS — I471 Supraventricular tachycardia, unspecified: Secondary | ICD-10-CM

## 2016-10-24 DIAGNOSIS — D631 Anemia in chronic kidney disease: Secondary | ICD-10-CM

## 2016-10-24 DIAGNOSIS — E1121 Type 2 diabetes mellitus with diabetic nephropathy: Secondary | ICD-10-CM | POA: Diagnosis not present

## 2016-10-24 DIAGNOSIS — N186 End stage renal disease: Secondary | ICD-10-CM | POA: Diagnosis not present

## 2016-10-24 DIAGNOSIS — I5022 Chronic systolic (congestive) heart failure: Secondary | ICD-10-CM

## 2016-10-24 LAB — IBC PANEL
Iron: 42 ug/dL (ref 42–165)
SATURATION RATIOS: 14.9 % — AB (ref 20.0–50.0)
Transferrin: 202 mg/dL — ABNORMAL LOW (ref 212.0–360.0)

## 2016-10-24 LAB — BASIC METABOLIC PANEL
BUN: 30 mg/dL — ABNORMAL HIGH (ref 6–23)
CALCIUM: 9.4 mg/dL (ref 8.4–10.5)
CHLORIDE: 100 meq/L (ref 96–112)
CO2: 27 meq/L (ref 19–32)
Creatinine, Ser: 9 mg/dL (ref 0.40–1.50)
GFR: 7.58 mL/min — CL (ref >60.00)
GLUCOSE: 131 mg/dL — AB (ref 70–99)
POTASSIUM: 4.5 meq/L (ref 3.5–5.1)
SODIUM: 143 meq/L (ref 135–145)

## 2016-10-24 LAB — CBC WITH DIFFERENTIAL/PLATELET
Basophils Absolute: 0 10*3/uL (ref 0.0–0.1)
Basophils Relative: 0.3 % (ref 0.0–3.0)
EOS PCT: 1.6 % (ref 0.0–5.0)
Eosinophils Absolute: 0.1 10*3/uL (ref 0.0–0.7)
HCT: 32.8 % — ABNORMAL LOW (ref 39.0–52.0)
HEMOGLOBIN: 10.7 g/dL — AB (ref 13.0–17.0)
LYMPHS PCT: 18.2 % (ref 12.0–46.0)
Lymphs Abs: 1.4 10*3/uL (ref 0.7–4.0)
MCHC: 32.7 g/dL (ref 30.0–36.0)
MCV: 95.4 fl (ref 78.0–100.0)
MONOS PCT: 14.3 % — AB (ref 3.0–12.0)
Monocytes Absolute: 1.1 10*3/uL — ABNORMAL HIGH (ref 0.1–1.0)
Neutro Abs: 5.1 10*3/uL (ref 1.4–7.7)
Neutrophils Relative %: 65.6 % (ref 43.0–77.0)
Platelets: 345 10*3/uL (ref 150.0–400.0)
RBC: 3.44 Mil/uL — AB (ref 4.22–5.81)
RDW: 18.9 % — ABNORMAL HIGH (ref 11.5–15.5)
WBC: 7.8 10*3/uL (ref 4.0–10.5)

## 2016-10-24 LAB — POCT GLYCOSYLATED HEMOGLOBIN (HGB A1C): Hemoglobin A1C: 5.5

## 2016-10-24 LAB — TSH: TSH: 1.14 u[IU]/mL (ref 0.35–4.50)

## 2016-10-24 MED ORDER — CARVEDILOL 3.125 MG PO TABS: 3.1250 mg | ORAL_TABLET | Freq: Two times a day (BID) | ORAL | 3 refills | Status: DC

## 2016-10-24 MED ORDER — METHIMAZOLE 5 MG PO TABS: 5.0000 mg | ORAL_TABLET | Freq: Every day | ORAL | 11 refills | Status: DC

## 2016-10-24 NOTE — Patient Instructions (Addendum)
blood tests are requested for you today.  We'll let you know about the results. Please resume the methimazole.  I have sent a prescription to your pharmacy. If ever you have fever while taking methimazole, stop it and call us, even if the reason is obvious, because of the risk of a rare side-effect. Please come back for a follow-up appointment in 4-6 weeks.

## 2016-10-24 NOTE — Patient Instructions (Signed)
Start Carvedilol 3.125 mg Twice daily ON NON HD DAYS ONLY  We will contact you in 3 months to schedule your next appointment.

## 2016-10-24 NOTE — Progress Notes (Signed)
Patient ID: TERRICK ALLRED, male   DOB: [birth year], 66 y.o.   MRN: [id number]  ADVANCED HF CLINIC NOTE  Patient ID: JAQUIS PICKLESIMER, male   DOB: 1951-03-10, 66 y.o.   MRN: [id number]  PCP: Dr [name] Nephrologist: Dr Mercy Moore  HPI: [name] is 66 year old male with PMH: obesity, DM, COPD on nighttime oxygen, sleep apnea on CPAP, ESRD- HD, CAD, S/P Taxus drug-eluting stent to the right coronary in September 2130, chronic systolic/diastolic heart failure (Unable to tolerate statins so placed on Welchol), prostate CA s/p XRT   Had RHC in 2/11 which showed low pressures (PCWP = 2) , PA 26/16 (22)  and normal cardiac output.  Echo 12/12: EF 55% Myoview 10/15/12: EF 33%. LV Wall Motion: There is global hypokinesis. The LV is markedly enlarged. Small fixed apical defect.  cMRI 02/19/13: EF 37% dilated LV. diffuse HK Echo 6/15: EF 25-30%  Echo 4/16: EF 50-55%  Evaluated at [facility] for kidney transplant but he was not felt to be candidate (11/2012) due to poor mobility, DM, and coronary disease.   CPX 06/04/13   Resting HR: 76 Peak HR: 109 (70% age predicted max HR) BP rest: 107/70 BP peak: 154/69 Peak VO2: 11.8 (53.7% predicted peak VO2) - when corrected to ibw pVO2 15.7 VE/VCO2 slope: 28.8 OUES: 1.90 Peak RER: 1.06  Ve/MVV 34.7%itted   Follow up: In 11/17 had NSTEMI underwent PCI LAD. RCA had long 80-90% but felt to be not amenable to PCI due to tortuosity. Admitted in 1/18 with SVT and small NSTEMI. Cath films reviewed again with Dr. Martinique and RCA not approachable percutaneously. Other wise feeling well. Denies further palpitations. No CP. Worried about recurrent SVT. Edema controlled with HD.   Past Medical History:  Diagnosis Date  . Allergic rhinitis, cause unspecified 02/24/2014  . Anemia 06/16/2011  . BENIGN PROSTATIC HYPERTROPHY 10/14/2009  . CAD, NATIVE VESSEL 02/06/2009   saw Dr. Missy Sabins last jan 2013  . Cervical radiculopathy, chronic 02/23/2016   Right c5-6 by NCS/EMG  . CHEST PAIN  03/29/2010  . COLONIC POLYPS, HX OF 10/14/2009  . CONGESTIVE HEART FAILURE 06/18/2007  . Dementia [id number]  . DEPRESSION 10/14/2009  . Depression 09/24/2015  . DIABETES MELLITUS, TYPE II 02/01/2010  . DIASTOLIC HEART FAILURE, CHRONIC 02/06/2009  . DIZZINESS 07/17/2010  . DYSLIPIDEMIA 06/18/2007  . DYSPNEA 10/29/2008  . ESRD (end stage renal disease) on dialysis ([place]) 08/04/2010   "TTS;  " (04/18/2015)  . FOOT PAIN 08/12/2008  . GAIT DISTURBANCE 03/03/2010  . GASTROENTERITIS, VIRAL 10/14/2009  . GERD 06/18/2007  . GOITER, MULTINODULAR 12/26/2007  . GOUT 06/18/2007  . GYNECOMASTIA 07/17/2010  . Hemodialysis access, fistula mature [facility])    Dialysis T-Th-Sa ([place]) Right upper arm fistula  . Hyperlipidemia 10/16/2011  . Hyperparathyroidism, secondary ([place]) 06/16/2011  . HYPERTENSION 06/18/2007  . Hyperthyroidism   . HYPERTHYROIDISM 02/02/2010  . Hypocalcemia 06/07/2010  . Ischemic cardiomyopathy 06/16/2011  . NECK PAIN 07/31/2010  . NSTEMI (non-ST elevated myocardial infarction) ([place]) 08/04/2016  . ONYCHOMYCOSIS, TOENAILS 12/26/2007  . OSA on CPAP 10/16/2011  . Other malaise and fatigue 11/24/2009  . PERIPHERAL NEUROPATHY 06/18/2007  . Prostate cancer ([place])   . PULMONARY NODULE, RIGHT LOWER LOBE 06/08/2009  . Renal insufficiency   . Sleep apnea    cpap machine and o2  . TRANSAMINASES, SERUM, ELEVATED 02/01/2010  . Transfusion history    none recent  . Unspecified hypotension 01/30/2010    Current Outpatient Prescriptions  Medication Sig Dispense Refill  .  acetaminophen (TYLENOL) 325 MG tablet Take 650 mg by mouth every 6 (six) hours as needed for mild pain or headache.     . allopurinol (ZYLOPRIM) 100 MG tablet Take 1 tablet (100 mg total) by mouth daily. 90 tablet 3  . aspirin 81 MG tablet Take 81 mg by mouth daily.    . citalopram (CELEXA) 10 MG tablet Take 10 mg by mouth 3 (three) times a week.    . clopidogrel (PLAVIX) 75 MG tablet Take 1 tablet (75 mg total) by mouth daily  with breakfast. 30 tablet 6  . colesevelam (WELCHOL) 625 MG tablet TAKE THREE TABLETS BY MOUTH TWICE DAILY WITH MEALS 180 tablet 11  . ezetimibe (ZETIA) 10 MG tablet Take 1 tablet (10 mg total) by mouth daily. 90 tablet 3  . gabapentin (NEURONTIN) 300 MG capsule 1-2 tabs by mouth at bedtime for sleep and pain (Patient taking differently: Take 600 mg by mouth at bedtime. ) 180 capsule 3  . Lanthanum Carbonate 1000 MG PACK Take 2 packets by mouth 2 (two) times daily.    . midodrine (PROAMATINE) 10 MG tablet Take 1 tablet (10 mg total) by mouth See admin instructions. Once a day only on dialysis days (Tues/Thurs/Sat) 30 tablet 0  . multivitamin (RENA-VIT) TABS tablet Take 1 tablet by mouth at bedtime. Reported on 10/24/2015    . pantoprazole (PROTONIX) 40 MG tablet Take 40 mg by mouth 2 (two) times daily.     . repaglinide (PRANDIN) 0.5 MG tablet Take 1 tablet (0.5 mg total) by mouth daily with breakfast. 30 tablet 11  . SENSIPAR 90 MG tablet Take 90 mg by mouth 2 (two) times daily.     No current facility-administered medications for this encounter.     Vitals:   10/24/16 1343  BP: 132/62  Pulse: 91    PHYSICAL EXAM: General:  NADWife present HEENT: normal Neck: supple. JVP flat. Carotids 2+ bilaterally; no bruits. No lymphadenopathy or thryomegaly appreciated. Cor: PMI normal. Regular rate & rhythm. No rubs, gallops 2/6 SEM LUSB. Lungs: clear Abdomen: obese  soft, nontender, nondistended. No hepatosplenomegaly. No bruits or masses. Good bowel sounds. Extremities: no cyanosis, clubbing, rash, edema.  Neuro: alert & orientedx3, cranial nerves grossly intact. Moves all 4 extremities w/o difficulty. Affect pleasant.   ASSESSMENT & PLAN:  1. Chronic Systolic Heart Fatigue:  Mixed ICM/NICM, EF 37% per c-MRI. EF 25-30% (Echo 6/15) Most recent EF 40-45% in 1/18 --chronic NYHAI III. Volume status well managed by HD 2. CAD --s/p PCI of LAD 11/17.  --recurrent NSTEMI in 1/18 due to SVT. Cath  films reviewed has 80-90% long lesion in RCA but not amenable to PCI due to tortuosity -- Continue ASA. Has not been able to tolerate statins due to myositis. Continue Zetia and Welchol.  -- Refer to CR 3. HTN  - very labile. Having periods of low BP especially with HD.  - continue midodrine on HD days. 4. ESRD -   HD Tues/Thur/Sat 5. PSVT - Start carvedilol 3.125 bid  on non-HD days -- If SVT more frequent can consider ablation   Bensimhon, Daniel,MD 10:22 PM

## 2016-10-25 DIAGNOSIS — D631 Anemia in chronic kidney disease: Secondary | ICD-10-CM | POA: Diagnosis not present

## 2016-10-25 DIAGNOSIS — N186 End stage renal disease: Secondary | ICD-10-CM | POA: Diagnosis not present

## 2016-10-25 DIAGNOSIS — N2581 Secondary hyperparathyroidism of renal origin: Secondary | ICD-10-CM | POA: Diagnosis not present

## 2016-10-25 DIAGNOSIS — E119 Type 2 diabetes mellitus without complications: Secondary | ICD-10-CM | POA: Diagnosis not present

## 2016-10-26 ENCOUNTER — Encounter (HOSPITAL_COMMUNITY): Payer: Medicare Other

## 2016-10-26 ENCOUNTER — Ambulatory Visit (INDEPENDENT_AMBULATORY_CARE_PROVIDER_SITE_OTHER): Payer: Medicare Other | Admitting: Internal Medicine

## 2016-10-26 ENCOUNTER — Encounter: Payer: Self-pay | Admitting: Internal Medicine

## 2016-10-26 VITALS — BP 138/80 | HR 77 | Resp 20 | Wt 234.0 lb

## 2016-10-26 DIAGNOSIS — Z992 Dependence on renal dialysis: Secondary | ICD-10-CM

## 2016-10-26 DIAGNOSIS — I10 Essential (primary) hypertension: Secondary | ICD-10-CM

## 2016-10-26 DIAGNOSIS — E785 Hyperlipidemia, unspecified: Secondary | ICD-10-CM | POA: Diagnosis not present

## 2016-10-26 DIAGNOSIS — D649 Anemia, unspecified: Secondary | ICD-10-CM

## 2016-10-26 DIAGNOSIS — N186 End stage renal disease: Secondary | ICD-10-CM

## 2016-10-26 DIAGNOSIS — D631 Anemia in chronic kidney disease: Secondary | ICD-10-CM

## 2016-10-26 LAB — FRUCTOSAMINE: Fructosamine: 275 umol/L — ABNORMAL HIGH (ref 190–270)

## 2016-10-26 NOTE — Progress Notes (Signed)
Subjective:    Patient ID: [name], male    DOB: [birth year], 66 y.o.   MRN: [id number]  HPI  Here after recent hospn: 1/16-18 with SVT, thyroid function abnormal and has seen Dr Rocco Pauls for this 2 days ago; low dose coreg begun, and  f/u labs already done and stable, no GI evaluation needed at this time.  No overt bleeding.  Pt denies chest pain, increased sob or doe, wheezing, orthopnea, PND, increased LE swelling, palpitations, or syncope, though still has significant dizziness/weakness post dialysis.   Pt denies fever, wt loss, night sweats, loss of appetite, or other constitutional symptoms Past Medical History:  Diagnosis Date  . Allergic rhinitis, cause unspecified 02/24/2014  . Anemia 06/16/2011  . BENIGN PROSTATIC HYPERTROPHY 10/14/2009  . CAD, NATIVE VESSEL 02/06/2009   saw Dr. Missy Sabins last jan 2013  . Cervical radiculopathy, chronic 02/23/2016   Right c5-6 by NCS/EMG  . CHEST PAIN 03/29/2010  . COLONIC POLYPS, HX OF 10/14/2009  . CONGESTIVE HEART FAILURE 06/18/2007  . Dementia [id number]  . DEPRESSION 10/14/2009  . Depression 09/24/2015  . DIABETES MELLITUS, TYPE II 02/01/2010  . DIASTOLIC HEART FAILURE, CHRONIC 02/06/2009  . DIZZINESS 07/17/2010  . DYSLIPIDEMIA 06/18/2007  . DYSPNEA 10/29/2008  . ESRD (end stage renal disease) on dialysis ([place]) 08/04/2010   "TTS;  " (04/18/2015)  . FOOT PAIN 08/12/2008  . GAIT DISTURBANCE 03/03/2010  . GASTROENTERITIS, VIRAL 10/14/2009  . GERD 06/18/2007  . GOITER, MULTINODULAR 12/26/2007  . GOUT 06/18/2007  . GYNECOMASTIA 07/17/2010  . Hemodialysis access, fistula mature [facility])    Dialysis T-Th-Sa ([place]) Right upper arm fistula  . Hyperlipidemia 10/16/2011  . Hyperparathyroidism, secondary ([place]) 06/16/2011  . HYPERTENSION 06/18/2007  . Hyperthyroidism   . HYPERTHYROIDISM 02/02/2010  . Hypocalcemia 06/07/2010  . Ischemic cardiomyopathy 06/16/2011  . NECK PAIN 07/31/2010  . NSTEMI (non-ST elevated myocardial infarction) ([place])  08/04/2016  . ONYCHOMYCOSIS, TOENAILS 12/26/2007  . OSA on CPAP 10/16/2011  . Other malaise and fatigue 11/24/2009  . PERIPHERAL NEUROPATHY 06/18/2007  . Prostate cancer ([place])   . PULMONARY NODULE, RIGHT LOWER LOBE 06/08/2009  . Renal insufficiency   . Sleep apnea    cpap machine and o2  . TRANSAMINASES, SERUM, ELEVATED 02/01/2010  . Transfusion history    none recent  . Unspecified hypotension 01/30/2010   Past Surgical History:  Procedure Laterality Date  . ARTERIOVENOUS GRAFT PLACEMENT Right 2009   forearm/notes 02/01/2011  . AV FISTULA PLACEMENT  11/07/2011   Procedure: INSERTION OF ARTERIOVENOUS (AV) GORE-TEX GRAFT ARM;  Surgeon: Tinnie Gens, MD;  Location: [place];  Service: Vascular;  Laterality: Left;  . BACK SURGERY  1998  . BASCILIC VEIN TRANSPOSITION Right 02/27/2013   Procedure: BASCILIC VEIN TRANSPOSITION;  Surgeon: Mal Misty, MD;  Location: [place];  Service: Vascular;  Laterality: Right;  Right Basilic Vein Transposition   . CARDIAC CATHETERIZATION N/A 08/06/2016   Procedure: Left Heart Cath and Coronary Angiography;  Surgeon: Jolaine Artist, MD;  Location: [place] CV LAB;  Service: Cardiovascular;  Laterality: N/A;  . CARDIAC CATHETERIZATION N/A 08/07/2016   Procedure: Coronary/Graft Atherectomy-CSI LAD;  Surgeon: Peter M Martinique, MD;  Location: [place] CV LAB;  Service: Cardiovascular;  Laterality: N/A;  . CERVICAL SPINE SURGERY  2/09   "to repair nerve problems in my left arm"  . CHOLECYSTECTOMY    . CORONARY ANGIOPLASTY WITH STENT PLACEMENT  06/11/2008  . CORONARY ANGIOPLASTY WITH STENT PLACEMENT  06/2007  TAXUS stent to RCA/notes 01/31/2011  . ESOPHAGOGASTRODUODENOSCOPY  09/28/2011   Procedure: ESOPHAGOGASTRODUODENOSCOPY (EGD);  Surgeon: Missy Sabins, MD;  Location[facility] END[facility]COPY;  Service: Endoscopy;  Laterality: N/A;  . ESOPHAGOGASTRODUODENOSCOPY N/A 04/07/2015   Procedure: ESOPHAGOGASTRODUODENOSCOPY (EGD);  Surgeon: Teena Irani, MD;  Location: [facility] ENDOSCOPY;  Service:  Endoscopy;  Laterality: N/A;  . ESOPHAGOGASTRODUODENOSCOPY N/A 04/19/2015   Procedure: ESOPHAGOGASTRODUODENOSCOPY (EGD);  Surgeon: Arta Silence, MD;  Location[facility] ENDOSCOPY;  Service: Endoscopy;  Laterality: N/A;  . FOREIGN BODY REMOVAL  09/2003   via upper endoscopy/notes 02/12/2011  . GIVENS CAPSULE STUDY  09/30/2011   Procedure: GIVENS CAPSULE STUDY;  Surgeon: Jeryl Columbia, MD;  Location: [facility] ENDOSCOPY;  Service: Endoscopy;  Laterality: N/A;  . INSERTION OF DIALYSIS CATHETER Right 2014  . INSERTION OF DIALYSIS CATHETER Left 02/11/2013   Procedure: INSERTION OF DIALYSIS CATHETER;  Surgeon: Conrad Dunsmuir, MD;  Location: [place];  Service: Vascular;  Laterality: Left;  Ultrasound guided  . REMOVAL OF A DIALYSIS CATHETER Right 02/11/2013   Procedure: REMOVAL OF A DIALYSIS CATHETER;  Surgeon: Conrad Wabasso, MD;  Location: [place];  Service: Vascular;  Laterality: Right;  . SAVORY DILATION N/A 04/07/2015   Procedure: SAVORY DILATION;  Surgeon: Teena Irani, MD;  Location: [facility] ENDOSCOPY;  Service: Endoscopy;  Laterality: N/A;  . SHUNTOGRAM N/A 09/20/2011   Procedure: Earney Mallet;  Surgeon: Conrad Wagoner, MD;  Location: [facility] CATH LAB;  Service: Cardiovascular;  Laterality: N/A;  . TONSILLECTOMY    . TOTAL KNEE ARTHROPLASTY Right 08/02/2015   Procedure: TOTAL KNEE ARTHROPLASTY;  Surgeon: Renette Butters, MD;  Location: [place];  Service: Orthopedics;  Laterality: Right;  . VENOGRAM N/A 01/26/2013   Procedure: VENOGRAM;  Surgeon: Angelia Mould, MD;  Location: [facility] CATH LAB;  Service: Cardiovascular;  Laterality: N/A;    reports that he quit smoking about 11 years ago. His smoking use included Cigarettes. He has a 25.00 pack-year smoking history. He quit smokeless tobacco use about 11 years ago. He reports that he does not drink alcohol or use drugs. family history includes Diabetes in his father; Healthy in his child, child, and child; Heart disease in his father and sister; Hypertension in his father; Kidney  failure in his father; Thyroid nodules in his sister. Allergies  Allergen Reactions  . Cephalexin Swelling and Other (See Comments)    Tongue swelling  . Statins Other (See Comments)    Weak muscles  . Ciprofloxacin Rash   Current Outpatient Prescriptions on File Prior to Visit  Medication Sig Dispense Refill  . acetaminophen (TYLENOL) 325 MG tablet Take 650 mg by mouth every 6 (six) hours as needed for mild pain or headache.     . allopurinol (ZYLOPRIM) 100 MG tablet Take 1 tablet (100 mg total) by mouth daily. 90 tablet 3  . aspirin 81 MG tablet Take 81 mg by mouth daily.    . carvedilol (COREG) 3.125 MG tablet Take 1 tablet (3.125 mg total) by mouth 2 (two) times daily. ON NON-HD DAYS 60 tablet 3  . citalopram (CELEXA) 10 MG tablet Take 10 mg by mouth 3 (three) times a week.    . clopidogrel (PLAVIX) 75 MG tablet Take 1 tablet (75 mg total) by mouth daily with breakfast. 30 tablet 6  . colesevelam (WELCHOL) 625 MG tablet TAKE THREE TABLETS BY MOUTH TWICE DAILY WITH MEALS 180 tablet 11  . ezetimibe (ZETIA) 10 MG tablet Take 1 tablet (10 mg total) by mouth daily. 90 tablet 3  .  gabapentin (NEURONTIN) 300 MG capsule 1-2 tabs by mouth at bedtime for sleep and pain (Patient taking differently: Take 600 mg by mouth at bedtime. ) 180 capsule 3  . Lanthanum Carbonate 1000 MG PACK Take 2 packets by mouth 2 (two) times daily.    . methimazole (TAPAZOLE) 5 MG tablet Take 1 tablet (5 mg total) by mouth daily. 30 tablet 11  . midodrine (PROAMATINE) 10 MG tablet Take 1 tablet (10 mg total) by mouth See admin instructions. Once a day only on dialysis days (Tues/Thurs/Sat) 30 tablet 0  . multivitamin (RENA-VIT) TABS tablet Take 1 tablet by mouth at bedtime. Reported on 10/24/2015    . pantoprazole (PROTONIX) 40 MG tablet Take 40 mg by mouth 2 (two) times daily.     . SENSIPAR 90 MG tablet Take 90 mg by mouth 2 (two) times daily.     No current facility-administered medications on file prior to visit.      Review of Systems  Constitutional: Negative for unusual diaphoresis or night sweats HENT: Negative for ear swelling or discharge Eyes: Negative for worsening visual haziness  Respiratory: Negative for choking and stridor.   Gastrointestinal: Negative for distension or worsening eructation Genitourinary: Negative for retention or change in urine volume.  Musculoskeletal: Negative for other MSK pain or swelling Skin: Negative for color change and worsening wound Neurological: Negative for tremors and numbness other than noted  Psychiatric/Behavioral: Negative for decreased concentration or agitation other than above   All other system neg per pt and wife     Objective:   Physical Exam BP 138/80   Pulse 77   Resp 20   Wt 234 lb (106.1 kg)   SpO2 96%   BMI 30.87 kg/m  VS noted, not ill appearing, obese Constitutional: Pt appears in no apparent distress HENT: Head: NCAT.  Right Ear: External ear normal.  Left Ear: External ear normal.  Eyes: . Pupils are equal, round, and reactive to light. Conjunctivae and EOM are normal Neck: Normal range of motion. Neck supple.  Cardiovascular: Normal rate and regular rhythm.   Pulmonary/Chest: Effort normal and breath sounds without rales or wheezing.  Abd:  Soft, NT, ND, + BS Neurological: Pt is alert. At baseline confused , motor grossly intact Skin: Skin is warm. No rash, no LE edema Psychiatric: Pt behavior is normal. No agitation.  No other new exam findings  Lab Results  Component Value Date   WBC 7.8 10/24/2016   HGB 10.7 (L) 10/24/2016   HCT 32.8 (L) 10/24/2016   PLT 345.0 10/24/2016   GLUCOSE 131 (H) 10/24/2016   CHOL 268 (H) 10/18/2016   TRIG 262 (H) 10/18/2016   HDL 46 10/18/2016   LDLDIRECT 136.0 07/16/2014   LDLCALC 170 (H) 10/18/2016   ALT 26 10/16/2016   AST 37 10/16/2016   NA 143 10/24/2016   K 4.5 10/24/2016   CL 100 10/24/2016   CREATININE 9.00 (HH) 10/24/2016   BUN 30 (H) 10/24/2016   CO2 27 10/24/2016    TSH 1.14 10/24/2016   PSA 3.67 08/30/2014   INR 0.88 10/16/2016   HGBA1C 5.5 10/24/2016   T3, Free 2.0 - 4.4 pg/mL 2.2        Assessment & Plan:

## 2016-10-26 NOTE — Assessment & Plan Note (Signed)
stable overall by history and exam, recent data reviewed with pt, and pt to continue medical treatment as before,  to f/u any worsening symptoms or concerns BP Readings from Last 3 Encounters:  10/26/16 138/80  10/24/16 132/62  10/24/16 132/84

## 2016-10-26 NOTE — Patient Instructions (Signed)
Please continue all other medications as before, and refills have been done if requested.  Please have the pharmacy call with any other refills you may need.  Please keep your appointments with your specialists as you may have planned  Please return in 6 months, or sooner if needed 

## 2016-10-26 NOTE — Assessment & Plan Note (Signed)
stable overall by history and exam, recent data reviewed with pt, and pt to continue medical treatment as before,  to f/u any worsening symptoms or concerns Lab Results  Component Value Date   WBC 7.8 10/24/2016   HGB 10.7 (L) 10/24/2016   HCT 32.8 (L) 10/24/2016   MCV 95.4 10/24/2016   PLT 345.0 10/24/2016

## 2016-10-26 NOTE — Assessment & Plan Note (Signed)
stable overall by history and exam, recent data reviewed with pt, and pt to continue medical treatment as before,  to f/u any worsening symptoms or concerns Lab Results  Component Value Date   LDLCALC 170 (H) 10/18/2016   D/w pt for lower chol diet, has been statin intolerant, declines nutrition counseling

## 2016-10-26 NOTE — Progress Notes (Signed)
Pre visit review using our clinic review tool, if applicable. No additional management support is needed unless otherwise documented below in the visit note. 

## 2016-10-27 DIAGNOSIS — N186 End stage renal disease: Secondary | ICD-10-CM | POA: Diagnosis not present

## 2016-10-27 DIAGNOSIS — N2581 Secondary hyperparathyroidism of renal origin: Secondary | ICD-10-CM | POA: Diagnosis not present

## 2016-10-27 DIAGNOSIS — D631 Anemia in chronic kidney disease: Secondary | ICD-10-CM | POA: Diagnosis not present

## 2016-10-27 DIAGNOSIS — E119 Type 2 diabetes mellitus without complications: Secondary | ICD-10-CM | POA: Diagnosis not present

## 2016-10-29 ENCOUNTER — Encounter (HOSPITAL_COMMUNITY)
Admission: RE | Admit: 2016-10-29 | Discharge: 2016-10-29 | Disposition: A | Discharge status: Home / Self Care | Payer: Medicare Other | Source: Ambulatory Visit | Attending: Internal Medicine | Admitting: Internal Medicine

## 2016-10-29 DIAGNOSIS — Z955 Presence of coronary angioplasty implant and graft: Secondary | ICD-10-CM

## 2016-10-29 DIAGNOSIS — I214 Non-ST elevation (NSTEMI) myocardial infarction: Secondary | ICD-10-CM

## 2016-10-29 DIAGNOSIS — Z48812 Encounter for surgical aftercare following surgery on the circulatory system: Secondary | ICD-10-CM | POA: Diagnosis not present

## 2016-10-29 LAB — GLUCOSE, CAPILLARY
GLUCOSE-CAPILLARY: 143 mg/dL — AB (ref 65–99)
Glucose-Capillary: 161 mg/dL — ABNORMAL HIGH (ref 65–99)

## 2016-10-30 DIAGNOSIS — N186 End stage renal disease: Secondary | ICD-10-CM | POA: Diagnosis not present

## 2016-10-30 DIAGNOSIS — D631 Anemia in chronic kidney disease: Secondary | ICD-10-CM | POA: Diagnosis not present

## 2016-10-30 DIAGNOSIS — E119 Type 2 diabetes mellitus without complications: Secondary | ICD-10-CM | POA: Diagnosis not present

## 2016-10-30 DIAGNOSIS — N2581 Secondary hyperparathyroidism of renal origin: Secondary | ICD-10-CM | POA: Diagnosis not present

## 2016-10-31 ENCOUNTER — Encounter (HOSPITAL_COMMUNITY)
Admission: RE | Admit: 2016-10-31 | Discharge: 2016-10-31 | Disposition: A | Discharge status: Home / Self Care | Payer: Medicare Other | Source: Ambulatory Visit | Attending: Internal Medicine | Admitting: Internal Medicine

## 2016-10-31 DIAGNOSIS — E1129 Type 2 diabetes mellitus with other diabetic kidney complication: Secondary | ICD-10-CM | POA: Diagnosis not present

## 2016-10-31 DIAGNOSIS — Z992 Dependence on renal dialysis: Secondary | ICD-10-CM | POA: Diagnosis not present

## 2016-10-31 DIAGNOSIS — N186 End stage renal disease: Secondary | ICD-10-CM | POA: Diagnosis not present

## 2016-10-31 DIAGNOSIS — Z955 Presence of coronary angioplasty implant and graft: Secondary | ICD-10-CM | POA: Diagnosis not present

## 2016-10-31 DIAGNOSIS — Z48812 Encounter for surgical aftercare following surgery on the circulatory system: Secondary | ICD-10-CM | POA: Diagnosis not present

## 2016-10-31 DIAGNOSIS — I214 Non-ST elevation (NSTEMI) myocardial infarction: Secondary | ICD-10-CM

## 2016-10-31 NOTE — Progress Notes (Signed)
Cardiac Individual Treatment Plan  Patient Details  Name: [name] MRN: [id number] Date of Birth: 08-13-1951 Referring Provider:   Flowsheet Row CARDIAC REHAB PHASE II ORIENTATION from 10/04/2016 in [place]  Referring Provider  Bensimhon, Daniel MD      Initial Encounter Date:  [place] PHASE II ORIENTATION from 10/04/2016 in [place]  Date  10/04/16  Referring Provider  Glori Bickers MD      Visit Diagnosis: 08/04/16 NSTEMI (non-ST elevated myocardial infarction) ([place])  08/04/16 Status post coronary artery stent placement  Patient's Home Medications on Admission:  Current Outpatient Prescriptions:  .  acetaminophen (TYLENOL) 325 MG tablet, Take 650 mg by mouth every 6 (six) hours as needed for mild pain or headache. , Disp: , Rfl:  .  allopurinol (ZYLOPRIM) 100 MG tablet, Take 1 tablet (100 mg total) by mouth daily., Disp: 90 tablet, Rfl: 3 .  aspirin 81 MG tablet, Take 81 mg by mouth daily., Disp: , Rfl:  .  carvedilol (COREG) 3.125 MG tablet, Take 1 tablet (3.125 mg total) by mouth 2 (two) times daily. ON NON-HD DAYS, Disp: 60 tablet, Rfl: 3 .  citalopram (CELEXA) 10 MG tablet, Take 10 mg by mouth 3 (three) times a week., Disp: , Rfl:  .  clopidogrel (PLAVIX) 75 MG tablet, Take 1 tablet (75 mg total) by mouth daily with breakfast., Disp: 30 tablet, Rfl: 6 .  colesevelam (WELCHOL) 625 MG tablet, TAKE THREE TABLETS BY MOUTH TWICE DAILY WITH MEALS, Disp: 180 tablet, Rfl: 11 .  ezetimibe (ZETIA) 10 MG tablet, Take 1 tablet (10 mg total) by mouth daily., Disp: 90 tablet, Rfl: 3 .  gabapentin (NEURONTIN) 300 MG capsule, 1-2 tabs by mouth at bedtime for sleep and pain (Patient taking differently: Take 600 mg by mouth at bedtime. ), Disp: 180 capsule, Rfl: 3 .  Lanthanum Carbonate 1000 MG PACK, Take 2 packets by mouth 2 (two) times daily., Disp: , Rfl:  .  methimazole (TAPAZOLE) 5 MG tablet, Take 1  tablet (5 mg total) by mouth daily., Disp: 30 tablet, Rfl: 11 .  midodrine (PROAMATINE) 10 MG tablet, Take 1 tablet (10 mg total) by mouth See admin instructions. Once a day only on dialysis days (Tues/Thurs/Sat), Disp: 30 tablet, Rfl: 0 .  multivitamin (RENA-VIT) TABS tablet, Take 1 tablet by mouth at bedtime. Reported on 10/24/2015, Disp: , Rfl:  .  pantoprazole (PROTONIX) 40 MG tablet, Take 40 mg by mouth 2 (two) times daily. , Disp: , Rfl:  .  SENSIPAR 90 MG tablet, Take 90 mg by mouth 2 (two) times daily., Disp: , Rfl:   Past Medical History: Past Medical History:  Diagnosis Date  . Allergic rhinitis, cause unspecified 02/24/2014  . Anemia 06/16/2011  . BENIGN PROSTATIC HYPERTROPHY 10/14/2009  . CAD, NATIVE VESSEL 02/06/2009   saw Dr. Missy Sabins last jan 2013  . Cervical radiculopathy, chronic 02/23/2016   Right c5-6 by NCS/EMG  . CHEST PAIN 03/29/2010  . COLONIC POLYPS, HX OF 10/14/2009  . CONGESTIVE HEART FAILURE 06/18/2007  . Dementia [id number]  . DEPRESSION 10/14/2009  . Depression 09/24/2015  . DIABETES MELLITUS, TYPE II 02/01/2010  . DIASTOLIC HEART FAILURE, CHRONIC 02/06/2009  . DIZZINESS 07/17/2010  . DYSLIPIDEMIA 06/18/2007  . DYSPNEA 10/29/2008  . ESRD (end stage renal disease) on dialysis ([place]) 08/04/2010   "TTS;  " (04/18/2015)  . FOOT PAIN 08/12/2008  . GAIT DISTURBANCE 03/03/2010  . GASTROENTERITIS, VIRAL 10/14/2009  .  GERD 06/18/2007  . GOITER, MULTINODULAR 12/26/2007  . GOUT 06/18/2007  . GYNECOMASTIA 07/17/2010  . Hemodialysis access, fistula mature [facility])    Dialysis [facility]Th-Sa ([place]) Right upper arm fistula  . Hyperlipidemia 10/16/2011  . Hyperparathyroidism, secondary ([place]) 06/16/2011  . HYPERTENSION 06/18/2007  . Hyperthyroidism   . HYPERTHYROIDISM 02/02/2010  . Hypocalcemia 06/07/2010  . Ischemic cardiomyopathy 06/16/2011  . NECK PAIN 07/31/2010  . NSTEMI (non-ST elevated myocardial infarction) ([place]) 08/04/2016  . ONYCHOMYCOSIS, TOENAILS 12/26/2007  . OSA on  CPAP 10/16/2011  . Other malaise and fatigue 11/24/2009  . PERIPHERAL NEUROPATHY 06/18/2007  . Prostate cancer ([place])   . PULMONARY NODULE, RIGHT LOWER LOBE 06/08/2009  . Renal insufficiency   . Sleep apnea    cpap machine and o2  . TRANSAMINASES, SERUM, ELEVATED 02/01/2010  . Transfusion history    none recent  . Unspecified hypotension 01/30/2010    Tobacco Use: History  Smoking Status  . Former Smoker  . Packs/day: 1.00  . Years: 25.00  . Types: Cigarettes  . Quit date: 10/01/2005  Smokeless Tobacco  . Former Systems developer  . Quit date: 10/01/2005    Comment: Quit smoking 2007 Smoked x 25 years 1/2 ppd.    Labs: Recent Review Flowsheet Data    Labs for ITP Cardiac and Pulmonary Rehab Latest Ref Rng & Units 08/04/2016 10/16/2016 10/16/2016 10/18/2016 10/24/2016   Cholestrol 0 - 200 mg/dL - - - 268(H) -   LDLCALC 0 - 99 mg/dL - - - 170(H) -   LDLDIRECT mg/dL - - - - -   HDL >40 mg/dL - - - 46 -   Trlycerides <150 mg/dL - - - 262(H) -   Hemoglobin A1c - - - - - 5.5   PHART 7.350 - 7.450 - - - - -   PCO2ART 35.0 - 45.0 mmHg - - - - -   HCO3 20.0 - 28.0 mmol/L - 30.0(H) - - -   TCO2 0 - 100 mmol/L 30 32 29 - -   O2SAT % - 79.0 - - -      Capillary Blood Glucose: Lab Results  Component Value Date   GLUCAP 143 (H) 10/29/2016   GLUCAP 161 (H) 10/29/2016   GLUCAP 85 10/18/2016   GLUCAP 95 10/18/2016   GLUCAP 131 (H) 10/17/2016     Exercise Target Goals:    Exercise Program Goal: Individual exercise prescription set with THRR, safety & activity barriers. Participant demonstrates ability to understand and report RPE using BORG scale, to self-measure pulse accurately, and to acknowledge the importance of the exercise prescription.  Exercise Prescription Goal: Starting with aerobic activity 30 plus minutes a day, 3 days per week for initial exercise prescription. Provide home exercise prescription and guidelines that participant acknowledges understanding prior to discharge.  Activity  Barriers & Risk Stratification:     Activity Barriers & Cardiac Risk Stratification - 10/04/16 0858      Activity Barriers & Cardiac Risk Stratification   Activity Barriers Balance Concerns;History of Falls;Muscular Weakness;Deconditioning;Assistive Device;Other (comment)   Comments L knee pain   Cardiac Risk Stratification High      6 Minute Walk:     6 Minute Walk    Row Name 10/04/16 1232         6 Minute Walk   Phase Initial     Distance 0.35 feet     Walk Time 6 minutes     # of Rest Breaks 0     MPH 0.21  METS 2.1     RPE 11     VO2 Peak 7.35     Symptoms No     Resting HR 79 bpm     Resting BP 170/86     Max Ex. HR 108 bpm     Max Ex. BP 158/80     2 Minute Post BP 158/86  10 minute post 158/80        Initial Exercise Prescription:     Initial Exercise Prescription - 10/04/16 1300      Date of Initial Exercise RX and Referring Provider   Date 10/04/16   Referring Provider Bensimhon, Daniel MD     Recumbant Bike   Level 2   Minutes 10   METs 2     NuStep   Level 2   Minutes 10   METs 2     Arm Ergometer   Level 1   Minutes 10   METs 2     Prescription Details   Frequency (times per week) 3   Duration Progress to 30 minutes of continuous aerobic without signs/symptoms of physical distress     Intensity   THRR 40-80% of Max Heartrate 65-131   Ratings of Perceived Exertion 11-13   Perceived Dyspnea 0-4     Progression   Progression Continue progressive overload as per policy without signs/symptoms or physical distress.     Resistance Training   Training Prescription Yes   Weight 2lbs   Reps 10-12      Perform Capillary Blood Glucose checks as needed.  Exercise Prescription Changes:     Exercise Prescription Changes    Row Name 10/30/16 1600             Exercise Review   Progression Yes         Response to Exercise   Blood Pressure (Admit) 130/70       Blood Pressure (Exercise) 114/62       Blood Pressure (Exit)  120/72       Heart Rate (Admit) 9 bpm       Heart Rate (Exercise) 114 bpm       Heart Rate (Exit) 102 bpm       Rating of Perceived Exertion (Exercise) 11       Symptoms none       Duration Progress to 30 minutes of continuous aerobic without signs/symptoms of physical distress       Intensity THRR unchanged         Progression   Average METs 2         Resistance Training   Training Prescription Yes       Weight 2lbs       Reps 10-12         Recumbant Bike   Level 2       Minutes 15       METs 2         NuStep   Level 3       Minutes 15       METs 2          Exercise Comments:     Exercise Comments    Row Name 10/30/16 1632           Exercise Comments Reviewed METs and goals. Pt is tolerating exercise fairly well; will continue to monitor exercise progression.          Discharge Exercise Prescription (Final Exercise Prescription Changes):     Exercise Prescription  Changes - 10/30/16 1600      Exercise Review   Progression Yes     Response to Exercise   Blood Pressure (Admit) 130/70   Blood Pressure (Exercise) 114/62   Blood Pressure (Exit) 120/72   Heart Rate (Admit) 9 bpm   Heart Rate (Exercise) 114 bpm   Heart Rate (Exit) 102 bpm   Rating of Perceived Exertion (Exercise) 11   Symptoms none   Duration Progress to 30 minutes of continuous aerobic without signs/symptoms of physical distress   Intensity THRR unchanged     Progression   Average METs 2     Resistance Training   Training Prescription Yes   Weight 2lbs   Reps 10-12     Recumbant Bike   Level 2   Minutes 15   METs 2     NuStep   Level 3   Minutes 15   METs 2      Nutrition:  Target Goals: Understanding of nutrition guidelines, daily intake of sodium 1500mg , cholesterol 200mg , calories 30% from fat and 7% or less from saturated fats, daily to have 5 or more servings of fruits and vegetables.  Biometrics:     Pre Biometrics - 10/04/16 1256      Pre Biometrics   Waist  Circumference 48.5 inches   Hip Circumference 31.75 inches   Waist to Hip Ratio 1.53 %   Triceps Skinfold 37 mm   Grip Strength 26 kg   Flexibility 0 in  balance concerns/balance deficit   Single Leg Stand 0 seconds       Nutrition Therapy Plan and Nutrition Goals:   Nutrition Discharge: Nutrition Scores:   Nutrition Goals Re-Evaluation:   Psychosocial: Target Goals: Acknowledge presence or absence of depression, maximize coping skills, provide positive support system. Participant is able to verbalize types and ability to use techniques and skills needed for reducing stress and depression.  Initial Review & Psychosocial Screening:     Initial Psych Review & Screening - 10/04/16 1835      Initial Review   Source of Stress Concerns Financial      Quality of Life Scores:     Quality of Life - 10/04/16 1357      Quality of Life Scores   Health/Function Pre 9.4 %   Socioeconomic Pre 12.88 %   Psych/Spiritual Pre 19.2 %   Family Pre 23.25 %   GLOBAL Pre 13.71 %      PHQ-9: Recent Review Flowsheet Data    Depression screen [facility] 2/9 10/15/2016 12/29/2015 10/24/2015 10/21/2015 01/13/2014   Decreased Interest 1 0 0 0 0   Down, Depressed, Hopeless 1 0 0 0 2   PHQ - 2 Score 2 0 0 0 2   Altered sleeping 1 - - - -   Tired, decreased energy 1 - - - -   Change in appetite 1 - - - -   Feeling bad or failure about yourself  0 - - - -   Trouble concentrating 1 - - - -   Moving slowly or fidgety/restless 0 - - - -   Suicidal thoughts 0 - - - -   PHQ-9 Score 6 - - - -   Difficult doing work/chores Somewhat difficult - - - -      Psychosocial Evaluation and Intervention:     Psychosocial Evaluation - 10/15/16 1541      Psychosocial Evaluation & Interventions   Interventions Stress management education;Relaxation education;Encouraged to exercise with the program and  follow exercise prescription   Comments pyschosocial barriers include hemodialysis schedule (3 days weekly)   and residual fatigue.  pt advised may need to adjust CR schedule accordingly.  pt has stress and depression symptoms related to chronic illness.     Continued Psychosocial Services Needed Yes      Psychosocial Re-Evaluation:     Psychosocial Re-Evaluation    Row Name 10/29/16 0753 10/31/16 1653           Psychosocial Re-Evaluation   Interventions  - Encouraged to attend Cardiac Rehabilitation for the exercise;Stress management education;Relaxation education      Comments pt with many comordities is presently out on medical leave.  psychosocial needs will be reevaluated if pt is able to return to cardiac rehab.  pt returned to cardiac rehab and tolerating light activity without difficulty.  pt is pleased to be able to return. pt with multiple comorbidities which does indicate barriers to rehab participation., including end stage renal disease and diabetes.        Continued Psychosocial Services Needed No Yes         Vocational Rehabilitation: Provide vocational rehab assistance to qualifying candidates.   Vocational Rehab Evaluation & Intervention:     Vocational Rehab - 10/04/16 1832      Initial Vocational Rehab Evaluation & Intervention   Assessment shows need for Vocational Rehabilitation No  Pt unable to return to work due to disability.      Education: Education Goals: Education classes will be provided on a weekly basis, covering required topics. Participant will state understanding/return demonstration of topics presented.  Learning Barriers/Preferences:     Learning Barriers/Preferences - 10/04/16 1356      Learning Barriers/Preferences   Learning Barriers None      Education Topics: Count Your Pulse:  -Group instruction provided by verbal instruction, demonstration, patient participation and written materials to support subject.  Instructors address importance of being able to find your pulse and how to count your pulse when at home without a heart monitor.   Patients get hands on experience counting their pulse with staff help and individually.   Heart Attack, Angina, and Risk Factor Modification:  -Group instruction provided by verbal instruction, video, and written materials to support subject.  Instructors address signs and symptoms of angina and heart attacks.    Also discuss risk factors for heart disease and how to make changes to improve heart health risk factors.   Functional Fitness:  -Group instruction provided by verbal instruction, demonstration, patient participation, and written materials to support subject.  Instructors address safety measures for doing things around the house.  Discuss how to get up and down off the floor, how to pick things up properly, how to safely get out of a chair without assistance, and balance training.   Meditation and Mindfulness:  -Group instruction provided by verbal instruction, patient participation, and written materials to support subject.  Instructor addresses importance of mindfulness and meditation practice to help reduce stress and improve awareness.  Instructor also leads participants through a meditation exercise.    Stretching for Flexibility and Mobility:  -Group instruction provided by verbal instruction, patient participation, and written materials to support subject.  Instructors lead participants through series of stretches that are designed to increase flexibility thus improving mobility.  These stretches are additional exercise for major muscle groups that are typically performed during regular warm up and cool down.   Hands Only CPR Anytime:  -Group instruction provided by verbal instruction, video, patient participation  and written materials to support subject.  Instructors co-teach with AHA video for hands only CPR.  Participants get hands on experience with mannequins.   Nutrition I class: Heart Healthy Eating:  -Group instruction provided by PowerPoint slides, verbal discussion,  and written materials to support subject matter. The instructor gives an explanation and review of the Therapeutic Lifestyle Changes diet recommendations, which includes a discussion on lipid goals, dietary fat, sodium, fiber, plant stanol/sterol esters, sugar, and the components of a well-balanced, healthy diet.   Nutrition II class: Lifestyle Skills:  -Group instruction provided by PowerPoint slides, verbal discussion, and written materials to support subject matter. The instructor gives an explanation and review of label reading, grocery shopping for heart health, heart healthy recipe modifications, and ways to make healthier choices when eating out.   Diabetes Question & Answer:  -Group instruction provided by PowerPoint slides, verbal discussion, and written materials to support subject matter. The instructor gives an explanation and review of diabetes co-morbidities, pre- and post-prandial blood glucose goals, pre-exercise blood glucose goals, signs, symptoms, and treatment of hypoglycemia and hyperglycemia, and foot care basics.   Diabetes Blitz:  -Group instruction provided by PowerPoint slides, verbal discussion, and written materials to support subject matter. The instructor gives an explanation and review of the physiology behind type 1 and type 2 diabetes, diabetes medications and rational behind using different medications, pre- and post-prandial blood glucose recommendations and Hemoglobin A1c goals, diabetes diet, and exercise including blood glucose guidelines for exercising safely.    Portion Distortion:  -Group instruction provided by PowerPoint slides, verbal discussion, written materials, and food models to support subject matter. The instructor gives an explanation of serving size versus portion size, changes in portions sizes over the last 20 years, and what consists of a serving from each food group.   Stress Management:  -Group instruction provided by verbal instruction,  video, and written materials to support subject matter.  Instructors review role of stress in heart disease and how to cope with stress positively.     Exercising on Your Own:  -Group instruction provided by verbal instruction, power point, and written materials to support subject.  Instructors discuss benefits of exercise, components of exercise, frequency and intensity of exercise, and end points for exercise.  Also discuss use of nitroglycerin and activating EMS.  Review options of places to exercise outside of rehab.  Review guidelines for sex with heart disease.   Cardiac Drugs I:  -Group instruction provided by verbal instruction and written materials to support subject.  Instructor reviews cardiac drug classes: antiplatelets, anticoagulants, beta blockers, and statins.  Instructor discusses reasons, side effects, and lifestyle considerations for each drug class.   Cardiac Drugs II:  -Group instruction provided by verbal instruction and written materials to support subject.  Instructor reviews cardiac drug classes: angiotensin converting enzyme inhibitors (ACE-I), angiotensin II receptor blockers (ARBs), nitrates, and calcium channel blockers.  Instructor discusses reasons, side effects, and lifestyle considerations for each drug class.   Anatomy and Physiology of the Circulatory System:  -Group instruction provided by verbal instruction, video, and written materials to support subject.  Reviews functional anatomy of heart, how it relates to various diagnoses, and what role the heart plays in the overall system.   Knowledge Questionnaire Score:     Knowledge Questionnaire Score - 10/04/16 1224      Knowledge Questionnaire Score   Pre Score 18/28      Core Components/Risk Factors/Patient Goals at Admission:     Personal Goals  and Risk Factors at Admission - 10/04/16 0859      Core Components/Risk Factors/Patient Goals on Admission    Weight Management Yes;Obesity;Weight Loss    Intervention Weight Management: Develop a combined nutrition and exercise program designed to reach desired caloric intake, while maintaining appropriate intake of nutrient and fiber, sodium and fats, and appropriate energy expenditure required for the weight goal.;Weight Management: Provide education and appropriate resources to help participant work on and attain dietary goals.;Weight Management/Obesity: Establish reasonable short term and long term weight goals.;Obesity: Provide education and appropriate resources to help participant work on and attain dietary goals.   Expected Outcomes Short Term: Continue to assess and modify interventions until short term weight is achieved;Weight Maintenance: Understanding of the daily nutrition guidelines, which includes 25-35% calories from fat, 7% or less cal from saturated fats, less than 200mg  cholesterol, less than 1.5gm of sodium, & 5 or more servings of fruits and vegetables daily;Understanding recommendations for meals to include 15-35% energy as protein, 25-35% energy from fat, 35-60% energy from carbohydrates, less than 200mg  of dietary cholesterol, 20-35 gm of total fiber daily;Weight Loss: Understanding of general recommendations for a balanced deficit meal plan, which promotes 1-2 lb weight loss per week and includes a negative energy balance of [phone] kcal/d;Understanding of distribution of calorie intake throughout the day with the consumption of 4-5 meals/snacks;Weight Gain: Understanding of general recommendations for a high calorie, high protein meal plan that promotes weight gain by distributing calorie intake throughout the day with the consumption for 4-5 meals, snacks, and/or supplements   Sedentary Yes   Intervention Provide advice, education, support and counseling about physical activity/exercise needs.;Develop an individualized exercise prescription for aerobic and resistive training based on initial evaluation findings, risk stratification,  comorbidities and participant's personal goals.   Expected Outcomes Achievement of increased cardiorespiratory fitness and enhanced flexibility, muscular endurance and strength shown through measurements of functional capacity and personal statement of participant.   Increase Strength and Stamina Yes   Intervention Provide advice, education, support and counseling about physical activity/exercise needs.;Develop an individualized exercise prescription for aerobic and resistive training based on initial evaluation findings, risk stratification, comorbidities and participant's personal goals.   Expected Outcomes Achievement of increased cardiorespiratory fitness and enhanced flexibility, muscular endurance and strength shown through measurements of functional capacity and personal statement of participant.   Improve shortness of breath with ADL's Yes   Intervention Provide education, individualized exercise plan and daily activity instruction to help decrease symptoms of SOB with activities of daily living.   Expected Outcomes Short Term: Achieves a reduction of symptoms when performing activities of daily living.   Diabetes Yes   Intervention Provide education about signs/symptoms and action to take for hypo/hyperglycemia.;Provide education about proper nutrition, including hydration, and aerobic/resistive exercise prescription along with prescribed medications to achieve blood glucose in normal ranges: Fasting glucose 65-99 mg/dL   Expected Outcomes Short Term: Participant verbalizes understanding of the signs/symptoms and immediate care of hyper/hypoglycemia, proper foot care and importance of medication, aerobic/resistive exercise and nutrition plan for blood glucose control.;Long Term: Attainment of HbA1C < 7%.   Lipids Yes   Intervention Provide education and support for participant on nutrition & aerobic/resistive exercise along with prescribed medications to achieve LDL 70mg , HDL >40mg .   Expected  Outcomes Short Term: Participant states understanding of desired cholesterol values and is compliant with medications prescribed. Participant is following exercise prescription and nutrition guidelines.;Long Term: Cholesterol controlled with medications as prescribed, with individualized exercise RX and with personalized nutrition  plan. Value goals: LDL < 70mg , HDL > 40 mg.   Stress Yes   Intervention Offer individual and/or small group education and counseling on adjustment to heart disease, stress management and health-related lifestyle change. Teach and support self-help strategies.;Refer participants experiencing significant psychosocial distress to appropriate mental health specialists for further evaluation and treatment. When possible, include family members and significant others in education/counseling sessions.   Expected Outcomes Short Term: Participant demonstrates changes in health-related behavior, relaxation and other stress management skills, ability to obtain effective social support, and compliance with psychotropic medications if prescribed.;Long Term: Emotional wellbeing is indicated by absence of clinically significant psychosocial distress or social isolation.   Personal Goal Other Yes   Personal Goal short: improve strength, gait and stability    long: improve standing tolerance and UE strength   Intervention Provide exercise programming to assist with improving cardiovascular fitness and exercise tolerance. Provide an exercise routine to assist with improving strength, balance and stability.   Expected Outcomes Pt will be able to improve standing tolerance and global strength to improve overall quality of life.      Core Components/Risk Factors/Patient Goals Review:      Goals and Risk Factor Review    Row Name 10/31/16 1459             Core Components/Risk Factors/Patient Goals Review   Personal Goals Review Increase Strength and Stamina       Review Pt stated still  needs improvement in UE strength, however does feel better after each cardiac rehab session. Pt stated feeling muscle soreness in a good way; feels as though getting stronger       Expected Outcomes Pt will continue to improve in cardiorespiratory fitness, strength and endurance          Core Components/Risk Factors/Patient Goals at Discharge (Final Review):      Goals and Risk Factor Review - 10/31/16 1459      Core Components/Risk Factors/Patient Goals Review   Personal Goals Review Increase Strength and Stamina   Review Pt stated still needs improvement in UE strength, however does feel better after each cardiac rehab session. Pt stated feeling muscle soreness in a good way; feels as though getting stronger   Expected Outcomes Pt will continue to improve in cardiorespiratory fitness, strength and endurance      ITP Comments:     ITP Comments    Row Name 10/04/16 0857           ITP Comments Dr. Fransico Him, Medical Director          Comments: Pt is making slow  progress toward personal goals after completing 4 sessions. Recommend continued exercise and life style modification education including  stress management and relaxation techniques to decrease cardiac risk profile.

## 2016-11-01 DIAGNOSIS — N186 End stage renal disease: Secondary | ICD-10-CM | POA: Diagnosis not present

## 2016-11-01 DIAGNOSIS — D631 Anemia in chronic kidney disease: Secondary | ICD-10-CM | POA: Diagnosis not present

## 2016-11-01 DIAGNOSIS — N2581 Secondary hyperparathyroidism of renal origin: Secondary | ICD-10-CM | POA: Diagnosis not present

## 2016-11-01 DIAGNOSIS — E119 Type 2 diabetes mellitus without complications: Secondary | ICD-10-CM | POA: Diagnosis not present

## 2016-11-02 ENCOUNTER — Encounter (HOSPITAL_COMMUNITY)
Admission: RE | Admit: 2016-11-02 | Discharge: 2016-11-02 | Disposition: A | Discharge status: Home / Self Care | Payer: Medicare Other | Source: Ambulatory Visit | Attending: Internal Medicine | Admitting: Internal Medicine

## 2016-11-02 DIAGNOSIS — Z955 Presence of coronary angioplasty implant and graft: Secondary | ICD-10-CM | POA: Diagnosis not present

## 2016-11-02 DIAGNOSIS — I214 Non-ST elevation (NSTEMI) myocardial infarction: Secondary | ICD-10-CM | POA: Insufficient documentation

## 2016-11-02 DIAGNOSIS — Z48812 Encounter for surgical aftercare following surgery on the circulatory system: Secondary | ICD-10-CM | POA: Insufficient documentation

## 2016-11-02 LAB — GLUCOSE, CAPILLARY: GLUCOSE-CAPILLARY: 144 mg/dL — AB (ref 65–99)

## 2016-11-03 DIAGNOSIS — N186 End stage renal disease: Secondary | ICD-10-CM | POA: Diagnosis not present

## 2016-11-03 DIAGNOSIS — D631 Anemia in chronic kidney disease: Secondary | ICD-10-CM | POA: Diagnosis not present

## 2016-11-03 DIAGNOSIS — N2581 Secondary hyperparathyroidism of renal origin: Secondary | ICD-10-CM | POA: Diagnosis not present

## 2016-11-03 DIAGNOSIS — E119 Type 2 diabetes mellitus without complications: Secondary | ICD-10-CM | POA: Diagnosis not present

## 2016-11-05 ENCOUNTER — Encounter (HOSPITAL_COMMUNITY): Payer: Medicare Other

## 2016-11-05 DIAGNOSIS — L602 Onychogryphosis: Secondary | ICD-10-CM | POA: Diagnosis not present

## 2016-11-05 DIAGNOSIS — C61 Malignant neoplasm of prostate: Secondary | ICD-10-CM | POA: Diagnosis not present

## 2016-11-05 DIAGNOSIS — E1351 Other specified diabetes mellitus with diabetic peripheral angiopathy without gangrene: Secondary | ICD-10-CM | POA: Diagnosis not present

## 2016-11-06 DIAGNOSIS — N2581 Secondary hyperparathyroidism of renal origin: Secondary | ICD-10-CM | POA: Diagnosis not present

## 2016-11-06 DIAGNOSIS — N186 End stage renal disease: Secondary | ICD-10-CM | POA: Diagnosis not present

## 2016-11-06 DIAGNOSIS — E119 Type 2 diabetes mellitus without complications: Secondary | ICD-10-CM | POA: Diagnosis not present

## 2016-11-06 DIAGNOSIS — D631 Anemia in chronic kidney disease: Secondary | ICD-10-CM | POA: Diagnosis not present

## 2016-11-07 ENCOUNTER — Encounter (HOSPITAL_COMMUNITY)
Admission: RE | Admit: 2016-11-07 | Discharge: 2016-11-07 | Disposition: A | Discharge status: Home / Self Care | Payer: Medicare Other | Source: Ambulatory Visit | Attending: Internal Medicine | Admitting: Internal Medicine

## 2016-11-07 DIAGNOSIS — Z48812 Encounter for surgical aftercare following surgery on the circulatory system: Secondary | ICD-10-CM | POA: Diagnosis not present

## 2016-11-07 DIAGNOSIS — Z955 Presence of coronary angioplasty implant and graft: Secondary | ICD-10-CM

## 2016-11-07 DIAGNOSIS — I214 Non-ST elevation (NSTEMI) myocardial infarction: Secondary | ICD-10-CM | POA: Diagnosis not present

## 2016-11-08 DIAGNOSIS — N2581 Secondary hyperparathyroidism of renal origin: Secondary | ICD-10-CM | POA: Diagnosis not present

## 2016-11-08 DIAGNOSIS — D631 Anemia in chronic kidney disease: Secondary | ICD-10-CM | POA: Diagnosis not present

## 2016-11-08 DIAGNOSIS — N186 End stage renal disease: Secondary | ICD-10-CM | POA: Diagnosis not present

## 2016-11-08 DIAGNOSIS — E119 Type 2 diabetes mellitus without complications: Secondary | ICD-10-CM | POA: Diagnosis not present

## 2016-11-09 ENCOUNTER — Encounter (HOSPITAL_COMMUNITY)
Admission: RE | Admit: 2016-11-09 | Discharge: 2016-11-09 | Disposition: A | Discharge status: Home / Self Care | Payer: Medicare Other | Source: Ambulatory Visit | Attending: Internal Medicine | Admitting: Internal Medicine

## 2016-11-09 DIAGNOSIS — Z48812 Encounter for surgical aftercare following surgery on the circulatory system: Secondary | ICD-10-CM | POA: Diagnosis not present

## 2016-11-09 DIAGNOSIS — I214 Non-ST elevation (NSTEMI) myocardial infarction: Secondary | ICD-10-CM | POA: Diagnosis not present

## 2016-11-09 DIAGNOSIS — Z955 Presence of coronary angioplasty implant and graft: Secondary | ICD-10-CM | POA: Diagnosis not present

## 2016-11-09 DIAGNOSIS — C61 Malignant neoplasm of prostate: Secondary | ICD-10-CM | POA: Diagnosis not present

## 2016-11-09 NOTE — Progress Notes (Signed)
[name] 66 y.o. male Nutrition Note Spoke with pt. Nutrition Plan and Nutrition Survey goals reviewed with pt. Pt is following Step 2 of the Therapeutic Lifestyle Changes diet according to MEDFICTS. Per discussion, pt states he gets a report from his dialysis center monthly and they discuss what pt needs to work on. Pt states his phosphorous has been high. Pt reports he takes his phos binder with meals and avoids whole grains. Pt states he wants to lose wt but is not actively trying to lose wt. Pt is diabetic. Last A1c indicates blood glucose well-controlled. This Probation officer went over Diabetes Education test results. Pt checks CBG's "occasionally." Pt with dx of CHF. Per discussion, pt does not limit sodium intake. Pt consumes processed meats regularly. Pt expressed understanding of the information reviewed. Pt aware of nutrition education classes offered and is unable to attend nutrition classes due to dialysis.  Lab Results  Component Value Date   HGBA1C 5.5 10/24/2016   Wt Readings from Last 3 Encounters:  10/26/16 234 lb (106.1 kg)  10/24/16 235 lb (106.6 kg)  10/24/16 235 lb (106.6 kg)    Nutrition Diagnosis ? Food-and nutrition-related knowledge deficit related to lack of exposure to information as related to diagnosis of: ? CVD ? DM ? Obesity related to excessive energy intake as evidenced by a BMI of 35.1  Nutrition Intervention ? Pt's individual nutrition plan reviewed with pt. ? Benefits of adopting Therapeutic Lifestyle Changes discussed when Medficts reviewed. ? Pt to attend the Portion Distortion class ? Pt to attend the Diabetes Q & A class  ? Pt given handouts for: ? Nutrition I class ? Nutrition II class ? Diabetes Blitz Class ? Continue client-centered nutrition education by RD, as part of interdisciplinary care. Goal(s) ? Slow wt loss of 1-2 lb/week to a wt loss goal of 6-24 lb while in Cardiac Rehab ? Use pre-meal and post-meal CBG's and A1c to determine whether  adjustments in food/meal planning will be beneficial or if any meds need to be combined with nutrition therapy.  Monitor and Evaluate progress toward nutrition goal with team. Derek Mound, M.Ed, RD, LDN, CDE 11/09/2016 2:05 PM

## 2016-11-10 DIAGNOSIS — E119 Type 2 diabetes mellitus without complications: Secondary | ICD-10-CM | POA: Diagnosis not present

## 2016-11-10 DIAGNOSIS — N186 End stage renal disease: Secondary | ICD-10-CM | POA: Diagnosis not present

## 2016-11-10 DIAGNOSIS — D631 Anemia in chronic kidney disease: Secondary | ICD-10-CM | POA: Diagnosis not present

## 2016-11-10 DIAGNOSIS — N2581 Secondary hyperparathyroidism of renal origin: Secondary | ICD-10-CM | POA: Diagnosis not present

## 2016-11-12 ENCOUNTER — Encounter (HOSPITAL_COMMUNITY)
Admission: RE | Admit: 2016-11-12 | Discharge: 2016-11-12 | Disposition: A | Discharge status: Home / Self Care | Payer: Medicare Other | Source: Ambulatory Visit | Attending: Internal Medicine | Admitting: Internal Medicine

## 2016-11-12 DIAGNOSIS — Z955 Presence of coronary angioplasty implant and graft: Secondary | ICD-10-CM | POA: Diagnosis not present

## 2016-11-12 DIAGNOSIS — I214 Non-ST elevation (NSTEMI) myocardial infarction: Secondary | ICD-10-CM

## 2016-11-12 DIAGNOSIS — Z48812 Encounter for surgical aftercare following surgery on the circulatory system: Secondary | ICD-10-CM | POA: Diagnosis not present

## 2016-11-12 NOTE — Telephone Encounter (Signed)
-----   Message from Fleet Contras, MD sent at 10/08/2016  8:18 AM EST ----- Not from renal standpoint.  He has arthritic knees that may limit him ----- Message ----- From: [name] Sent: 10/03/2016   4:45 PM To: Fleet Contras, MD  [name],  The listed patient is signed up for cardiac rehab. Pt orients on 10/04/16. Is this patient appropriate to begin an exercise program? Are there any BP restrictions or exercise limitations? Please advise!    Thank you in advance!   Angellica Maddison [employer]

## 2016-11-13 DIAGNOSIS — N186 End stage renal disease: Secondary | ICD-10-CM | POA: Diagnosis not present

## 2016-11-13 DIAGNOSIS — E119 Type 2 diabetes mellitus without complications: Secondary | ICD-10-CM | POA: Diagnosis not present

## 2016-11-13 DIAGNOSIS — N2581 Secondary hyperparathyroidism of renal origin: Secondary | ICD-10-CM | POA: Diagnosis not present

## 2016-11-13 DIAGNOSIS — D631 Anemia in chronic kidney disease: Secondary | ICD-10-CM | POA: Diagnosis not present

## 2016-11-14 ENCOUNTER — Encounter (HOSPITAL_COMMUNITY)
Admission: RE | Admit: 2016-11-14 | Discharge: 2016-11-14 | Disposition: A | Discharge status: Home / Self Care | Payer: Medicare Other | Source: Ambulatory Visit | Attending: Internal Medicine | Admitting: Internal Medicine

## 2016-11-14 DIAGNOSIS — I214 Non-ST elevation (NSTEMI) myocardial infarction: Secondary | ICD-10-CM | POA: Diagnosis not present

## 2016-11-14 DIAGNOSIS — Z955 Presence of coronary angioplasty implant and graft: Secondary | ICD-10-CM

## 2016-11-14 DIAGNOSIS — Z48812 Encounter for surgical aftercare following surgery on the circulatory system: Secondary | ICD-10-CM | POA: Diagnosis not present

## 2016-11-15 ENCOUNTER — Emergency Department (HOSPITAL_COMMUNITY)
Admission: EM | Admit: 2016-11-15 | Discharge: 2016-11-15 | Disposition: A | Discharge status: Home / Self Care | Payer: Medicare Other | Attending: Emergency Medicine | Admitting: Emergency Medicine

## 2016-11-15 ENCOUNTER — Encounter (HOSPITAL_COMMUNITY): Payer: Self-pay | Admitting: Family Medicine

## 2016-11-15 DIAGNOSIS — E1140 Type 2 diabetes mellitus with diabetic neuropathy, unspecified: Secondary | ICD-10-CM | POA: Insufficient documentation

## 2016-11-15 DIAGNOSIS — I252 Old myocardial infarction: Secondary | ICD-10-CM | POA: Insufficient documentation

## 2016-11-15 DIAGNOSIS — R000 Tachycardia, unspecified: Secondary | ICD-10-CM | POA: Diagnosis not present

## 2016-11-15 DIAGNOSIS — D631 Anemia in chronic kidney disease: Secondary | ICD-10-CM | POA: Diagnosis not present

## 2016-11-15 DIAGNOSIS — Z87891 Personal history of nicotine dependence: Secondary | ICD-10-CM | POA: Diagnosis not present

## 2016-11-15 DIAGNOSIS — I471 Supraventricular tachycardia: Secondary | ICD-10-CM | POA: Insufficient documentation

## 2016-11-15 DIAGNOSIS — E1122 Type 2 diabetes mellitus with diabetic chronic kidney disease: Secondary | ICD-10-CM | POA: Diagnosis not present

## 2016-11-15 DIAGNOSIS — Z8546 Personal history of malignant neoplasm of prostate: Secondary | ICD-10-CM | POA: Diagnosis not present

## 2016-11-15 DIAGNOSIS — Z992 Dependence on renal dialysis: Secondary | ICD-10-CM | POA: Insufficient documentation

## 2016-11-15 DIAGNOSIS — R531 Weakness: Secondary | ICD-10-CM | POA: Diagnosis not present

## 2016-11-15 DIAGNOSIS — Z7982 Long term (current) use of aspirin: Secondary | ICD-10-CM | POA: Diagnosis not present

## 2016-11-15 DIAGNOSIS — I5022 Chronic systolic (congestive) heart failure: Secondary | ICD-10-CM | POA: Diagnosis not present

## 2016-11-15 DIAGNOSIS — I132 Hypertensive heart and chronic kidney disease with heart failure and with stage 5 chronic kidney disease, or end stage renal disease: Secondary | ICD-10-CM | POA: Insufficient documentation

## 2016-11-15 DIAGNOSIS — Z79899 Other long term (current) drug therapy: Secondary | ICD-10-CM | POA: Diagnosis not present

## 2016-11-15 DIAGNOSIS — N2581 Secondary hyperparathyroidism of renal origin: Secondary | ICD-10-CM | POA: Diagnosis not present

## 2016-11-15 DIAGNOSIS — Z96651 Presence of right artificial knee joint: Secondary | ICD-10-CM | POA: Diagnosis not present

## 2016-11-15 DIAGNOSIS — N186 End stage renal disease: Secondary | ICD-10-CM | POA: Diagnosis not present

## 2016-11-15 DIAGNOSIS — I2510 Atherosclerotic heart disease of native coronary artery without angina pectoris: Secondary | ICD-10-CM | POA: Insufficient documentation

## 2016-11-15 DIAGNOSIS — E119 Type 2 diabetes mellitus without complications: Secondary | ICD-10-CM | POA: Diagnosis not present

## 2016-11-15 LAB — BASIC METABOLIC PANEL
ANION GAP: 16 — AB (ref 5–15)
BUN: 25 mg/dL — ABNORMAL HIGH (ref 6–20)
CHLORIDE: 100 mmol/L — AB (ref 101–111)
CO2: 23 mmol/L (ref 22–32)
Calcium: 8.6 mg/dL — ABNORMAL LOW (ref 8.9–10.3)
Creatinine, Ser: 7.94 mg/dL — ABNORMAL HIGH (ref 0.61–1.24)
GFR calc non Af Amer: 6 mL/min — ABNORMAL LOW (ref >60)
GFR, EST AFRICAN AMERICAN: 7 mL/min — AB (ref >60)
Glucose, Bld: 107 mg/dL — ABNORMAL HIGH (ref 65–99)
POTASSIUM: 4.6 mmol/L (ref 3.5–5.1)
SODIUM: 139 mmol/L (ref 135–145)

## 2016-11-15 LAB — CBC
HEMATOCRIT: 35.7 % — AB (ref 39.0–52.0)
HEMOGLOBIN: 11.1 g/dL — AB (ref 13.0–17.0)
MCH: 30 pg (ref 26.0–34.0)
MCHC: 31.1 g/dL (ref 30.0–36.0)
MCV: 96.5 fL (ref 78.0–100.0)
Platelets: 245 10*3/uL (ref 150–400)
RBC: 3.7 MIL/uL — AB (ref 4.22–5.81)
RDW: 18.9 % — ABNORMAL HIGH (ref 11.5–15.5)
WBC: 7.7 10*3/uL (ref 4.0–10.5)

## 2016-11-15 NOTE — ED Notes (Signed)
Pt departed in NAD.  

## 2016-11-15 NOTE — ED Notes (Signed)
EKG given to Dr. Knapp. 

## 2016-11-15 NOTE — Discharge Instructions (Signed)
Follow up with your cardiologist as we discussed.  If you have another episode of DVT you can try the maneuvers we discussed but come to the ED if you cannot get it to break within 15-20 minutes

## 2016-11-15 NOTE — ED Notes (Signed)
Spoke w/ phlebotomy, stated that they would be on the way to collect the blood for labs.

## 2016-11-15 NOTE — ED Triage Notes (Signed)
Pt presents via [facility] with c/o tachycardia and hypotension - pt was at dialysis and started having palpitations - was found to be in 160's SVT and hypotensive 36'K systolic. Pt given 6mg  Adenosine and converted to NSR at 88bpm, BP 100/64. Pt is A&Ox4 in NAD at this time. Fistula is de-accessed.

## 2016-11-15 NOTE — ED Provider Notes (Signed)
[facility] DEPT Provider Note   CSN: [id number] Arrival date & time: 11/15/16  1625     History   Chief Complaint Chief Complaint  Patient presents with  . Tachycardia  . Hypotension    HPI [name] is a 66 y.o. male.  HPI The patient presents to the emergency room for evaluation of an episode of tachycardia and hypotension.  Patient has a history of chronic renal failure and was at dialysis today.  During his treatment, he started to feel lightheaded and his vision became blurry. The patient has had episodes of low blood pressure in the past and he felt that his blood pressure was too low. Staff at the facility noted that his blood pressure was in the 60s and he had a narrow complex tachycardia in the 160s. EMS was called. Approximately 45 minutes later according to the family, EMS gave the patient 6 mg of adenosine and he converted to a normal sinus rhythm. Patient's symptoms then resolved. He denies any trouble with any chest pain or shortness of breath. He denies any vomiting, diarrhea or other complaints.  He has had episodes of abnormal heart rhythms in the past.  He was admitted to the hospital last month for an episode of supraventricular tachycardia that was treated with amiodarone. Past Medical History:  Diagnosis Date  . Allergic rhinitis, cause unspecified 02/24/2014  . Anemia 06/16/2011  . BENIGN PROSTATIC HYPERTROPHY 10/14/2009  . CAD, NATIVE VESSEL 02/06/2009   saw Dr. Missy Sabins last jan 2013  . Cervical radiculopathy, chronic 02/23/2016   Right c5-6 by NCS/EMG  . CHEST PAIN 03/29/2010  . COLONIC POLYPS, HX OF 10/14/2009  . CONGESTIVE HEART FAILURE 06/18/2007  . Dementia [id number]  . DEPRESSION 10/14/2009  . Depression 09/24/2015  . DIABETES MELLITUS, TYPE II 02/01/2010  . DIASTOLIC HEART FAILURE, CHRONIC 02/06/2009  . DIZZINESS 07/17/2010  . DYSLIPIDEMIA 06/18/2007  . DYSPNEA 10/29/2008  . ESRD (end stage renal disease) on dialysis ([place]) 08/04/2010   "TTS;  "  (04/18/2015)  . FOOT PAIN 08/12/2008  . GAIT DISTURBANCE 03/03/2010  . GASTROENTERITIS, VIRAL 10/14/2009  . GERD 06/18/2007  . GOITER, MULTINODULAR 12/26/2007  . GOUT 06/18/2007  . GYNECOMASTIA 07/17/2010  . Hemodialysis access, fistula mature [facility])    Dialysis T[facility]Th-Sa ([place]) Right upper arm fistula  . Hyperlipidemia 10/16/2011  . Hyperparathyroidism, secondary ([place]) 06/16/2011  . HYPERTENSION 06/18/2007  . Hyperthyroidism   . HYPERTHYROIDISM 02/02/2010  . Hypocalcemia 06/07/2010  . Ischemic cardiomyopathy 06/16/2011  . NECK PAIN 07/31/2010  . NSTEMI (non-ST elevated myocardial infarction) ([place]) 08/04/2016  . ONYCHOMYCOSIS, TOENAILS 12/26/2007  . OSA on CPAP 10/16/2011  . Other malaise and fatigue 11/24/2009  . PERIPHERAL NEUROPATHY 06/18/2007  . Prostate cancer ([place])   . PULMONARY NODULE, RIGHT LOWER LOBE 06/08/2009  . Renal insufficiency   . Sleep apnea    cpap machine and o2  . TRANSAMINASES, SERUM, ELEVATED 02/01/2010  . Transfusion history    none recent  . Unspecified hypotension 01/30/2010    Patient Active Problem List   Diagnosis Date Noted  . SVT (supraventricular tachycardia) ([place]) 10/16/2016  . Abdominal pain 08/27/2016  . Abnormal findings on diagnostic imaging of liver and biliary tract 08/27/2016  . Bloating symptom 08/27/2016  . Hematochezia 08/27/2016  . Early satiety 08/27/2016  . Eructation 08/27/2016  . Esophageal ulcer 08/27/2016  . Flatulence, eructation and gas pain 08/27/2016  . General weakness 08/27/2016  . Skin sensation disturbance 08/27/2016  . Stricture of esophagus 08/27/2016  .  Alternating constipation and diarrhea 08/16/2016  . Status post coronary artery stent placement   . Abnormal EKG   . ST segment depression 08/05/2016  . PAD (peripheral artery disease) ([place]) 06/18/2016  . Transient hypotension 04/29/2016  . Cervical radiculopathy, chronic 02/23/2016  . Memory loss 12/22/2015  . Left-sided weakness 12/22/2015  . Peripheral  polyneuropathy ([place]) 12/22/2015  . DM (diabetes mellitus) type II controlled with renal manifestation ([place]) 10/31/2015  . Nausea and vomiting 10/31/2015  . Malignant neoplasm of prostate ([place]) 10/24/2015  . Right leg pain 09/04/2015  . Chest pain with high risk for cardiac etiology 08/09/2015  . Acute encephalopathy 08/09/2015  . S/P right total knee replacement 08/09/2015  . Primary osteoarthritis of right knee 08/05/2015  . Neuropathy due to secondary diabetes mellitus ([place]) 08/05/2015  . Arthritis of knee 08/02/2015  . Headache 06/08/2015  . Melena 04/19/2015  . Acute blood loss anemia 04/18/2015  . Hyperthyroidism 03/11/2015  . Thigh pain 12/08/2014  . Nodule of right lung 09/14/2014  . PSA elevation 08/30/2014  . ESRD on hemodialysis ([place]) 08/16/2014  . Essential hypertension 08/16/2014  . Pre-transplant evaluation for kidney transplant 08/16/2014  . Allergic rhinitis 02/24/2014  . Elevated PSA 02/24/2014  . Vertigo 10/14/2013  . Cough 06/26/2013  . Numbness 05/15/2013  . Chronic systolic heart failure ([place]) 02/09/2013  . Other complications due to renal dialysis device, implant, and graft 01/14/2013  . Right hand pain 03/28/2012  . Dysphagia 03/28/2012  . Hypogonadism, male 03/28/2012  . OSA (obstructive sleep apnea) 10/16/2011  . Hyperlipidemia 10/16/2011  . CAD (coronary artery disease) 09/28/2011  . NSTEMI (non-ST elevated myocardial infarction) ([place]) 09/28/2011  . AVM (arteriovenous malformation) 09/27/2011  . Anemia associated with acute blood loss 09/26/2011  . Anemia 06/16/2011  . Ischemic cardiomyopathy 06/16/2011  . Hyperparathyroidism, secondary ([place]) 06/16/2011  . Preventative health care 03/11/2011  . NECK PAIN 07/31/2010  . GYNECOMASTIA 07/17/2010  . DIZZINESS 07/17/2010  . GAIT DISTURBANCE 03/03/2010  . Thyrotoxicosis 02/02/2010  . Elevated levels of transaminase & lactic acid dehydrogenase 02/01/2010  . Other malaise and fatigue 11/24/2009  .  Depression 10/14/2009  . BENIGN PROSTATIC HYPERTROPHY 10/14/2009  . COLONIC POLYPS, HX OF 10/14/2009  . Dementia 09/02/2009  . PULMONARY NODULE, RIGHT LOWER LOBE 06/08/2009  . Chronic diastolic congestive heart failure ([place]) 02/06/2009  . DYSPNEA 10/29/2008  . FOOT PAIN 08/12/2008  . ONYCHOMYCOSIS, TOENAILS 12/26/2007  . GOITER, MULTINODULAR 12/26/2007  . GOUT 06/18/2007  . Neuropathy ([place]) 06/18/2007  . Hypertensive heart disease with CHF (congestive heart failure) ([place]) 06/18/2007  . Gastro-esophageal reflux disease without esophagitis 06/18/2007    Past Surgical History:  Procedure Laterality Date  . ARTERIOVENOUS GRAFT PLACEMENT Right 2009   forearm/notes 02/01/2011  . AV FISTULA PLACEMENT  11/07/2011   Procedure: INSERTION OF ARTERIOVENOUS (AV) GORE-TEX GRAFT ARM;  Surgeon: Tinnie Gens, MD;  Location: [facility];  Service: Vascular;  Laterality: Left;  . BACK SURGERY  1998  . BASCILIC VEIN TRANSPOSITION Right 02/27/2013   Procedure: BASCILIC VEIN TRANSPOSITION;  Surgeon: Mal Misty, MD;  Location: [facility];  Service: Vascular;  Laterality: Right;  Right Basilic Vein Transposition   . CARDIAC CATHETERIZATION N/A 08/06/2016   Procedure: Left Heart Cath and Coronary Angiography;  Surgeon: Jolaine Artist, MD;  Location: [facility] CV LAB;  Service: Cardiovascular;  Laterality: N/A;  . CARDIAC CATHETERIZATION N/A 08/07/2016   Procedure: Coronary/Graft Atherectomy-CSI LAD;  Surgeon: Peter M Martinique, MD;  Location: [facility] CV LAB;  Service: Cardiovascular;  Laterality: N/A;  . CERVICAL SPINE SURGERY  2/09   "to repair nerve problems in my left arm"  . CHOLECYSTECTOMY    . CORONARY ANGIOPLASTY WITH STENT PLACEMENT  06/11/2008  . CORONARY ANGIOPLASTY WITH STENT PLACEMENT  06/2007   TAXUS stent to RCA/notes 01/31/2011  . ESOPHAGOGASTRODUODENOSCOPY  09/28/2011   Procedure: ESOPHAGOGASTRODUODENOSCOPY (EGD);  Surgeon: Missy Sabins, MD;  Location[facility] ENDOSCOPY;  Service: Endoscopy;  Laterality:  N/A;  . ESOPHAGOGASTRODUODENOSCOPY N/A 04/07/2015   Procedure: ESOPHAGOGASTRODUODENOSCOPY (EGD);  Surgeon: Teena Irani, MD;  Location: [facility] ENDOSCOPY;  Service: Endoscopy;  Laterality: N/A;  . ESOPHAGOGASTRODUODENOSCOPY N/A 04/19/2015   Procedure: ESOPHAGOGASTRODUODENOSCOPY (EGD);  Surgeon: Arta Silence, MD;  Location: [facility] ENDOSCOPY;  Service: Endoscopy;  Laterality: N/A;  . FOREIGN BODY REMOVAL  09/2003   via upper endoscopy/notes 02/12/2011  . GIVENS CAPSULE STUDY  09/30/2011   Procedure: GIVENS CAPSULE STUDY;  Surgeon: Jeryl Columbia, MD;  Location[facility] ENDOSCOPY;  Service: Endoscopy;  Laterality: N/A;  . INSERTION OF DIALYSIS CATHETER Right 2014  . INSERTION OF DIALYSIS CATHETER Left 02/11/2013   Procedure: INSERTION OF DIALYSIS CATHETER;  Surgeon: Conrad Sayville, MD;  Location: [facility];  Service: Vascular;  Laterality: Left;  Ultrasound guided  . REMOVAL OF A DIALYSIS CATHETER Right 02/11/2013   Procedure: REMOVAL OF A DIALYSIS CATHETER;  Surgeon: Conrad Olcott, MD;  Location: [facility];  Service: Vascular;  Laterality: Right;  . SAVORY DILATION N/A 04/07/2015   Procedure: SAVORY DILATION;  Surgeon: Teena Irani, MD;  Location: [facility] ENDOSCOPY;  Service: Endoscopy;  Laterality: N/A;  . SHUNTOGRAM N/A 09/20/2011   Procedure: Earney Mallet;  Surgeon: Conrad Eunice, MD;  Location: [facility] CATH LAB;  Service: Cardiovascular;  Laterality: N/A;  . TONSILLECTOMY    . TOTAL KNEE ARTHROPLASTY Right 08/02/2015   Procedure: TOTAL KNEE ARTHROPLASTY;  Surgeon: Renette Butters, MD;  Location: [facility];  Service: Orthopedics;  Laterality: Right;  . VENOGRAM N/A 01/26/2013   Procedure: VENOGRAM;  Surgeon: Angelia Mould, MD;  Location: [name] CATH LAB;  Service: Cardiovascular;  Laterality: N/A;       Home Medications    Prior to Admission medications   Medication Sig Start Date End Date Taking? Authorizing Provider  acetaminophen (TYLENOL) 325 MG tablet Take 650 mg by mouth every 6 (six) hours as needed for mild pain or headache.      Historical Provider, MD  allopurinol (ZYLOPRIM) 100 MG tablet Take 1 tablet (100 mg total) by mouth daily. 07/20/16   Biagio Borg, MD  aspirin 81 MG tablet Take 81 mg by mouth daily.    Historical Provider, MD  carvedilol (COREG) 3.125 MG tablet Take 1 tablet (3.125 mg total) by mouth 2 (two) times daily. ON NON-HD DAYS 10/24/16 01/22/17  Jolaine Artist, MD  citalopram (CELEXA) 10 MG tablet Take 10 mg by mouth 3 (three) times a week.    Historical Provider, MD  clopidogrel (PLAVIX) 75 MG tablet Take 1 tablet (75 mg total) by mouth daily with breakfast. 09/05/16   Jolaine Artist, MD  colesevelam [facility]) 625 MG tablet TAKE THREE TABLETS BY MOUTH TWICE DAILY WITH MEALS 07/20/16   Biagio Borg, MD  ezetimibe (ZETIA) 10 MG tablet Take 1 tablet (10 mg total) by mouth daily. 03/15/16   Jolaine Artist, MD  gabapentin (NEURONTIN) 300 MG capsule 1-2 tabs by mouth at bedtime for sleep and pain Patient taking differently: Take 600 mg by mouth at bedtime.  07/20/16   Hunt Oris  John, MD  Lanthanum Carbonate 1000 MG PACK Take 2 packets by mouth 2 (two) times daily.    Historical Provider, MD  methimazole (TAPAZOLE) 5 MG tablet Take 1 tablet (5 mg total) by mouth daily. 10/24/16   Renato Shin, MD  midodrine (PROAMATINE) 10 MG tablet Take 1 tablet (10 mg total) by mouth See admin instructions. Once a day only on dialysis days (Tues/Thurs/Sat) 10/18/16   Belkys A Regalado, MD  multivitamin (RENA-VIT) TABS tablet Take 1 tablet by mouth at bedtime. Reported on 10/24/2015    Historical Provider, MD  pantoprazole (PROTONIX) 40 MG tablet Take 40 mg by mouth 2 (two) times daily.  08/31/16   Historical Provider, MD  SENSIPAR 90 MG tablet Take 90 mg by mouth 2 (two) times daily. 09/18/16   Historical Provider, MD    Family History Family History  Problem Relation Age of Onset  . Heart disease Sister   . Thyroid nodules Sister   . Heart disease Father   . Diabetes Father   . Kidney failure Father   .  Hypertension Father   . Healthy Child   . Healthy Child   . Healthy Child   . Cancer Neg Hx     Social History Social History  Substance Use Topics  . Smoking status: Former Smoker    Packs/day: 1.00    Years: 25.00    Types: Cigarettes    Quit date: 10/01/2005  . Smokeless tobacco: Former Systems developer    Quit date: 10/01/2005     Comment: Quit smoking 2007 Smoked x 25 years 1/2 ppd.  . Alcohol use No     Allergies   Cephalexin; Statins; and Ciprofloxacin   Review of Systems Review of Systems  All other systems reviewed and are negative.    Physical Exam Updated Vital Signs BP 102/91   Pulse 85   Temp 98.8 F (37.1 C) (Oral)   Resp 21   SpO2 95%   Physical Exam  Constitutional: No distress.  Overweight  HENT:  Head: Normocephalic and atraumatic.  Right Ear: External ear normal.  Left Ear: External ear normal.  Eyes: Conjunctivae are normal. Right eye exhibits no discharge. Left eye exhibits no discharge. No scleral icterus.  Neck: Neck supple. No tracheal deviation present.  Cardiovascular: Normal rate, regular rhythm and intact distal pulses.   Pulmonary/Chest: Effort normal and breath sounds normal. No stridor. No respiratory distress. He has no wheezes. He has no rales.  Abdominal: Soft. Bowel sounds are normal. He exhibits no distension. There is no tenderness. There is no rebound and no guarding.  Musculoskeletal: He exhibits edema (mild in lower extremities). He exhibits no tenderness.  Neurological: He is alert. He has normal strength. No cranial nerve deficit (no facial droop, extraocular movements intact, no slurred speech) or sensory deficit. He exhibits normal muscle tone. He displays no seizure activity. Coordination normal.  Skin: Skin is warm and dry. No rash noted.  Psychiatric: He has a normal mood and affect.  Nursing note and vitals reviewed.    ED Treatments / Results  Labs (all labs ordered are listed, but only abnormal results are  displayed) Labs Reviewed  CBC - Abnormal; Notable for the following:       Result Value   RBC 3.70 (*)    Hemoglobin 11.1 (*)    HCT 35.7 (*)    RDW 18.9 (*)    All other components within normal limits  BASIC METABOLIC PANEL - Abnormal; Notable for the following:  Chloride 100 (*)    Glucose, Bld 107 (*)    BUN 25 (*)    Creatinine, Ser 7.94 (*)    Calcium 8.6 (*)    GFR calc non Af Amer 6 (*)    GFR calc Af Amer 7 (*)    Anion gap 16 (*)    All other components within normal limits    EKG  EKG Interpretation  Date/Time:  Thursday November 15 2016 16:29:41 EST Ventricular Rate:  88 PR Interval:    QRS Duration: 105 QT Interval:  422 QTC Calculation: 511 R Axis:   59 Text Interpretation:  Sinus rhythm Prolonged QT interval No significant change since last tracing Confirmed by Jadee Golebiewski  MD-J, Abhimanyu Cruces ([id number]) on 11/15/2016 4:38:42 PM      Procedures Procedures (including critical care time)  Medications Ordered in ED Medications - No data to display   Initial Impression / Assessment and Plan / ED Course  I have reviewed the triage vital signs and the nursing notes.  Pertinent labs & imaging results that were available during my care of the patient were reviewed by me and considered in my medical decision making (see chart for details).  Patient presented to the emergency room after an episodes of supraventricular tachycardia. He was given adenosine by EMS.  The tachycardia resolved and he felt better.  the patient was monitored in the emergency room. He had no further episodes of tachydysrhythmia. He remained asymptomatic. Patient appears stable for outpatient follow-up with his cardiologist. DISCUSSED Valsalva maneuvers and the need for him to return to the emergency room if  he has a persistent episode lasting greater than 15-20 minutes.  Final Clinical Impressions(s) / ED Diagnoses   Final diagnoses:  SVT (supraventricular tachycardia) ([place])    New  Prescriptions New Prescriptions   No medications on file     Dorie Rank, MD 11/15/16 2039

## 2016-11-16 ENCOUNTER — Telehealth (HOSPITAL_COMMUNITY): Payer: Self-pay | Admitting: Internal Medicine

## 2016-11-16 ENCOUNTER — Telehealth (HOSPITAL_COMMUNITY): Payer: Self-pay | Admitting: *Deleted

## 2016-11-16 ENCOUNTER — Encounter (HOSPITAL_COMMUNITY): Payer: Medicare Other

## 2016-11-16 DIAGNOSIS — I471 Supraventricular tachycardia: Secondary | ICD-10-CM

## 2016-11-16 NOTE — Telephone Encounter (Signed)
Pt's wife called to report pt had SVT yesterday at HD and was brought to ER, he was sent home last night and had 2 more episodes at home with lasted 15 min each and broke.  She reports he seems ok this AM with no episodes.  Discussed w/Dr Bensimhon, he would like pt to get EP consult ASAP for ?SVT ablation.  Referral placed, called and got pt sch w/Dr Curt Bears on Tue 2/20 at 8 am.  Pt's wife aware she is unsure if they can make an appt that early and pt has HD on Tue as well, provided her office number if she wants to resch.

## 2016-11-17 DIAGNOSIS — E119 Type 2 diabetes mellitus without complications: Secondary | ICD-10-CM | POA: Diagnosis not present

## 2016-11-17 DIAGNOSIS — N2581 Secondary hyperparathyroidism of renal origin: Secondary | ICD-10-CM | POA: Diagnosis not present

## 2016-11-17 DIAGNOSIS — N186 End stage renal disease: Secondary | ICD-10-CM | POA: Diagnosis not present

## 2016-11-17 DIAGNOSIS — D631 Anemia in chronic kidney disease: Secondary | ICD-10-CM | POA: Diagnosis not present

## 2016-11-19 ENCOUNTER — Encounter (HOSPITAL_COMMUNITY)
Admission: RE | Admit: 2016-11-19 | Discharge: 2016-11-19 | Disposition: A | Discharge status: Home / Self Care | Payer: Medicare Other | Source: Ambulatory Visit | Attending: Internal Medicine | Admitting: Internal Medicine

## 2016-11-19 ENCOUNTER — Encounter: Payer: Self-pay | Admitting: Cardiology

## 2016-11-19 DIAGNOSIS — Z955 Presence of coronary angioplasty implant and graft: Secondary | ICD-10-CM

## 2016-11-19 DIAGNOSIS — I214 Non-ST elevation (NSTEMI) myocardial infarction: Secondary | ICD-10-CM | POA: Diagnosis not present

## 2016-11-19 DIAGNOSIS — Z48812 Encounter for surgical aftercare following surgery on the circulatory system: Secondary | ICD-10-CM | POA: Diagnosis not present

## 2016-11-20 ENCOUNTER — Other Ambulatory Visit: Payer: Self-pay | Admitting: Cardiology

## 2016-11-20 ENCOUNTER — Encounter: Payer: Self-pay | Admitting: Cardiology

## 2016-11-20 ENCOUNTER — Ambulatory Visit (INDEPENDENT_AMBULATORY_CARE_PROVIDER_SITE_OTHER): Payer: Medicare Other | Admitting: Cardiology

## 2016-11-20 VITALS — BP 160/82 | HR 88 | Ht 73.0 in | Wt 241.6 lb

## 2016-11-20 DIAGNOSIS — M25531 Pain in right wrist: Secondary | ICD-10-CM | POA: Diagnosis not present

## 2016-11-20 DIAGNOSIS — I471 Supraventricular tachycardia: Secondary | ICD-10-CM | POA: Diagnosis not present

## 2016-11-20 DIAGNOSIS — E119 Type 2 diabetes mellitus without complications: Secondary | ICD-10-CM | POA: Diagnosis not present

## 2016-11-20 DIAGNOSIS — D631 Anemia in chronic kidney disease: Secondary | ICD-10-CM | POA: Diagnosis not present

## 2016-11-20 DIAGNOSIS — N186 End stage renal disease: Secondary | ICD-10-CM | POA: Diagnosis not present

## 2016-11-20 DIAGNOSIS — N2581 Secondary hyperparathyroidism of renal origin: Secondary | ICD-10-CM | POA: Diagnosis not present

## 2016-11-20 LAB — CBC
Hematocrit: 35.6 % — ABNORMAL LOW (ref 37.5–51.0)
Hemoglobin: 11.7 g/dL — ABNORMAL LOW (ref 13.0–17.7)
MCH: 30.4 pg (ref 26.6–33.0)
MCHC: 32.9 g/dL (ref 31.5–35.7)
MCV: 93 fL (ref 79–97)
PLATELETS: 304 10*3/uL (ref 150–379)
RBC: 3.85 x10E6/uL — AB (ref 4.14–5.80)
RDW: 18.2 % — AB (ref 12.3–15.4)
WBC: 8.7 10*3/uL (ref 3.4–10.8)

## 2016-11-20 LAB — BASIC METABOLIC PANEL
BUN / CREAT RATIO: 5 — AB (ref 10–24)
BUN: 59 mg/dL — ABNORMAL HIGH (ref 8–27)
CO2: 18 mmol/L (ref 18–29)
CREATININE: 12.55 mg/dL — AB (ref 0.76–1.27)
Calcium: 9.4 mg/dL (ref 8.6–10.2)
Chloride: 95 mmol/L — ABNORMAL LOW (ref 96–106)
GFR, EST AFRICAN AMERICAN: 4 — AB (ref >59)
GFR, EST NON AFRICAN AMERICAN: 4 — AB (ref >59)
Glucose: 160 mg/dL — ABNORMAL HIGH (ref 65–99)
Potassium: 6 mmol/L — ABNORMAL HIGH (ref 3.5–5.2)
SODIUM: 143 mmol/L (ref 134–144)

## 2016-11-20 NOTE — Progress Notes (Signed)
Electrophysiology Office Note   Date:  11/20/2016   ID:  [name], [name] 1950-12-09, MRN [id number]  PCP:  [name], MD  Cardiologist:  [place] Primary Electrophysiologist:  Keilon Ressel Meredith Leeds, MD    Chief Complaint  Patient presents with  . Palpitations     History of Present Illness: [name] is a 66 y.o. male who presents today for electrophysiology evaluation.   He has a history of obesity, diabetes, COPD on nighttime oxygen, sleep apnea on CPAP, ESRD- HD, CAD, S/P Taxus drug-eluting stent to the right coronary in September 0263, chronic systolic/diastolic heart failure (Unable to tolerate statins so placed on Welchol), prostate CA s/p XRT. On 11/17, he underwent an STEMI and underwent PCI to the LAD. The RCA had a long 80-90% lesion but was felt not amenable to PCI due to tortuosity. He was admitted on 1/18 with SVT and an an STEMI. He had another episode of SVT on 11/15/16 and presented to the emergency room. This was complicated by hypotension, lightheadedness and blurry vision. He was undergoing dialysis at the time when his heart rate went up to the 160s. He was given 6 mg of adenosine which converted him to sinus rhythm. His symptoms then resolved. He says he feels like he goes and SVT when he is on dialysis and his blood pressure drops. His wife feels like he has been SVT multiple times at home but is not presented to the hospital.   Today, he denies symptoms of palpitations, chest pain, shortness of breath, orthopnea, PND, lower extremity edema, claudication, dizziness, presyncope, syncope, bleeding, or neurologic sequela. The patient is tolerating medications without difficulties and is otherwise without complaint today.    Past Medical History:  Diagnosis Date  . Allergic rhinitis, cause unspecified 02/24/2014  . Anemia 06/16/2011  . BENIGN PROSTATIC HYPERTROPHY 10/14/2009  . CAD, NATIVE VESSEL 02/06/2009   saw Dr. Missy Sabins last jan 2013  . Cervical  radiculopathy, chronic 02/23/2016   Right c5-6 by NCS/EMG  . CHEST PAIN 03/29/2010  . COLONIC POLYPS, HX OF 10/14/2009  . CONGESTIVE HEART FAILURE 06/18/2007  . Dementia [id number]  . DEPRESSION 10/14/2009  . Depression 09/24/2015  . DIABETES MELLITUS, TYPE II 02/01/2010  . DIASTOLIC HEART FAILURE, CHRONIC 02/06/2009  . DIZZINESS 07/17/2010  . DYSLIPIDEMIA 06/18/2007  . DYSPNEA 10/29/2008  . ESRD (end stage renal disease) on dialysis ([place]) 08/04/2010   "TTS;  " (04/18/2015)  . FOOT PAIN 08/12/2008  . GAIT DISTURBANCE 03/03/2010  . GASTROENTERITIS, VIRAL 10/14/2009  . GERD 06/18/2007  . GOITER, MULTINODULAR 12/26/2007  . GOUT 06/18/2007  . GYNECOMASTIA 07/17/2010  . Hemodialysis access, fistula mature [facility])    Dialysis [facility] ([facility]) Right upper arm fistula  . Hyperlipidemia 10/16/2011  . Hyperparathyroidism, secondary ([place]) 06/16/2011  . HYPERTENSION 06/18/2007  . Hyperthyroidism   . HYPERTHYROIDISM 02/02/2010  . Hypocalcemia 06/07/2010  . Ischemic cardiomyopathy 06/16/2011  . NECK PAIN 07/31/2010  . NSTEMI (non-ST elevated myocardial infarction) ([place]) 08/04/2016  . ONYCHOMYCOSIS, TOENAILS 12/26/2007  . OSA on CPAP 10/16/2011  . Other malaise and fatigue 11/24/2009  . PERIPHERAL NEUROPATHY 06/18/2007  . Prostate cancer ([place])   . PULMONARY NODULE, RIGHT LOWER LOBE 06/08/2009  . Renal insufficiency   . Sleep apnea    cpap machine and o2  . TRANSAMINASES, SERUM, ELEVATED 02/01/2010  . Transfusion history    none recent  . Unspecified hypotension 01/30/2010   Past Surgical History:  Procedure Laterality Date  . ARTERIOVENOUS GRAFT  PLACEMENT Right 2009   forearm/notes 02/01/2011  . AV FISTULA PLACEMENT  11/07/2011   Procedure: INSERTION OF ARTERIOVENOUS (AV) GORE-TEX GRAFT ARM;  Surgeon: Tinnie Gens, MD;  Location: [place];  Service: Vascular;  Laterality: Left;  . BACK SURGERY  1998  . BASCILIC VEIN TRANSPOSITION Right 02/27/2013   Procedure: BASCILIC VEIN TRANSPOSITION;  Surgeon: Mal Misty, MD;  Location: [place];  Service: Vascular;  Laterality: Right;  Right Basilic Vein Transposition   . CARDIAC CATHETERIZATION N/A 08/06/2016   Procedure: Left Heart Cath and Coronary Angiography;  Surgeon: Jolaine Artist, MD;  Location: [place] CV LAB;  Service: Cardiovascular;  Laterality: N/A;  . CARDIAC CATHETERIZATION N/A 08/07/2016   Procedure: Coronary/Graft Atherectomy-CSI LAD;  Surgeon: Peter M Martinique, MD;  Location: [place] CV LAB;  Service: Cardiovascular;  Laterality: N/A;  . CERVICAL SPINE SURGERY  2/09   "to repair nerve problems in my left arm"  . CHOLECYSTECTOMY    . CORONARY ANGIOPLASTY WITH STENT PLACEMENT  06/11/2008  . CORONARY ANGIOPLASTY WITH STENT PLACEMENT  06/2007   TAXUS stent to RCA/notes 01/31/2011  . ESOPHAGOGASTRODUODENOSCOPY  09/28/2011   Procedure: ESOPHAGOGASTRODUODENOSCOPY (EGD);  Surgeon: Missy Sabins, MD;  Location[facility]COPY;  Service: Endoscopy;  Laterality: N/A;  . ESOPHAGOGASTRODUODENOSCOPY N/A 04/07/2015   Procedure: ESOPHAGOGASTRODUODENOSCOPY (EGD);  Surgeon: Teena Irani, MD;  Location: [facility];  Service: Endoscopy;  Laterality: N/A;  . ESOPHAGOGASTRODUODENOSCOPY N/A 04/19/2015   Procedure: ESOPHAGOGASTRODUODENOSCOPY (EGD);  Surgeon: Arta Silence, MD;  Location: [facility] ENDOSCOPY;  Service: Endoscopy;  Laterality: N/A;  . FOREIGN BODY REMOVAL  09/2003   via upper endoscopy/notes 02/12/2011  . GIVENS CAPSULE STUDY  09/30/2011   Procedure: GIVENS CAPSULE STUDY;  Surgeon: Jeryl Columbia, MD;  Location: [facility] ENDOSCOPY;  Service: Endoscopy;  Laterality: N/A;  . INSERTION OF DIALYSIS CATHETER Right 2014  . INSERTION OF DIALYSIS CATHETER Left 02/11/2013   Procedure: INSERTION OF DIALYSIS CATHETER;  Surgeon: Conrad [place], MD;  Location: [place];  Service: Vascular;  Laterality: Left;  Ultrasound guided  . REMOVAL OF A DIALYSIS CATHETER Right 02/11/2013   Procedure: REMOVAL OF A DIALYSIS CATHETER;  Surgeon: Conrad [place], MD;  Location: [place];  Service:  Vascular;  Laterality: Right;  . SAVORY DILATION N/A 04/07/2015   Procedure: SAVORY DILATION;  Surgeon: Teena Irani, MD;  Location: [facility] ENDOSCOPY;  Service: Endoscopy;  Laterality: N/A;  . SHUNTOGRAM N/A 09/20/2011   Procedure: Earney Mallet;  Surgeon: Conrad , MD;  Location: [facility] CATH LAB;  Service: Cardiovascular;  Laterality: N/A;  . TONSILLECTOMY    . TOTAL KNEE ARTHROPLASTY Right 08/02/2015   Procedure: TOTAL KNEE ARTHROPLASTY;  Surgeon: Renette Butters, MD;  Location: [place];  Service: Orthopedics;  Laterality: Right;  . VENOGRAM N/A 01/26/2013   Procedure: VENOGRAM;  Surgeon: Angelia Mould, MD;  Location: [facility] CATH LAB;  Service: Cardiovascular;  Laterality: N/A;     Current Outpatient Prescriptions  Medication Sig Dispense Refill  . acetaminophen (TYLENOL) 325 MG tablet Take 650 mg by mouth every 6 (six) hours as needed for mild pain or headache.     . allopurinol (ZYLOPRIM) 100 MG tablet Take 1 tablet (100 mg total) by mouth daily. 90 tablet 3  . aspirin 81 MG tablet Take 81 mg by mouth daily.    . carvedilol (COREG) 3.125 MG tablet Take 1 tablet (3.125 mg total) by mouth 2 (two) times daily. ON NON-HD DAYS 60 tablet 3  . citalopram (CELEXA) 10 MG tablet  Take 10 mg by mouth 3 (three) times a week.    . clopidogrel (PLAVIX) 75 MG tablet Take 1 tablet (75 mg total) by mouth daily with breakfast. 30 tablet 6  . colesevelam (WELCHOL) 625 MG tablet TAKE THREE TABLETS BY MOUTH TWICE DAILY WITH MEALS 180 tablet 11  . ezetimibe (ZETIA) 10 MG tablet Take 1 tablet (10 mg total) by mouth daily. 90 tablet 3  . gabapentin (NEURONTIN) 300 MG capsule 1-2 tabs by mouth at bedtime for sleep and pain 180 capsule 3  . Lanthanum Carbonate 1000 MG PACK Take 2 packets by mouth 2 (two) times daily.    . methimazole (TAPAZOLE) 5 MG tablet Take 1 tablet (5 mg total) by mouth daily. 30 tablet 11  . midodrine (PROAMATINE) 10 MG tablet Take 1 tablet (10 mg total) by mouth See admin instructions. Once a day  only on dialysis days (Tues/Thurs/Sat) 30 tablet 0  . multivitamin (RENA-VIT) TABS tablet Take 1 tablet by mouth at bedtime. Reported on 10/24/2015    . pantoprazole (PROTONIX) 40 MG tablet Take 40 mg by mouth 2 (two) times daily.     . SENSIPAR 90 MG tablet Take 90 mg by mouth 2 (two) times daily.     No current facility-administered medications for this visit.     Allergies:   Cephalexin; Statins; and Ciprofloxacin   Social History:  The patient  reports that he quit smoking about 11 years ago. His smoking use included Cigarettes. He has a 25.00 pack-year smoking history. He quit smokeless tobacco use about 11 years ago. He reports that he does not drink alcohol or use drugs.   Family History:  The patient's family history includes Diabetes in his father; Healthy in his child, child, and child; Heart disease in his father and sister; Hypertension in his father; Kidney failure in his father; Thyroid nodules in his sister.    ROS:  Please see the history of present illness.   Otherwise, review of systems is positive for joint swelling, balance problems, headaches.   All other systems are reviewed and negative.    PHYSICAL EXAM: VS:  BP (!) 160/82   Pulse 88   Ht 6\' 1"  (1.854 m)   Wt 241 lb 9.6 oz (109.6 kg)   SpO2 93%   BMI 31.88 kg/m  , BMI Body mass index is 31.88 kg/m. GEN: Well nourished, well developed, in no acute distress  HEENT: normal  Neck: no JVD, carotid bruits, or masses Cardiac: RRR; no murmurs, rubs, or gallops,no edema  Respiratory:  clear to auscultation bilaterally, normal work of breathing GI: soft, nontender, nondistended, + BS MS: no deformity or atrophy  Skin: warm and dry Neuro:  Strength and sensation are intact Psych: euthymic mood, full affect  EKG:  EKG is ordered today. Personal review of the ekg ordered shows sinus rhythm, rate 88, nonspecific ST abnormality  Recent Labs: 10/16/2016: ALT 26; Magnesium 2.0 10/24/2016: TSH 1.14 11/15/2016: BUN 25;  Creatinine, Ser 7.94; Hemoglobin 11.1; Platelets 245; Potassium 4.6; Sodium 139    Lipid Panel     Component Value Date/Time   CHOL 268 (H) 10/18/2016 0232   TRIG 262 (H) 10/18/2016 0232   HDL 46 10/18/2016 0232   CHOLHDL 5.8 10/18/2016 0232   VLDL 52 (H) 10/18/2016 0232   LDLCALC 170 (H) 10/18/2016 0232   LDLDIRECT 136.0 07/16/2014 1503     Wt Readings from Last 3 Encounters:  11/20/16 241 lb 9.6 oz (109.6 kg)  10/26/16 234 lb (  106.1 kg)  10/24/16 235 lb (106.6 kg)      Other studies Reviewed: Additional studies/ records that were reviewed today include: TTE 10/17/16, Cath 08/07/16  Review of the above records today demonstrates:  - Left ventricle: The cavity size was normal. Systolic function was   mildly to moderately reduced. The estimated ejection fraction was   in the range of 40% to 45%. Wall motion was normal; there were no   regional wall motion abnormalities. Features are consistent with   a pseudonormal left ventricular filling pattern, with concomitant   abnormal relaxation and increased filling pressure (grade 2   diastolic dysfunction). Doppler parameters are consistent with   elevated ventricular end-diastolic filling pressure. - Aortic valve: There was no regurgitation. - Aortic root: The aortic root was normal in size. - Mitral valve: There was trivial regurgitation. - Left atrium: The atrium was mildly dilated. - Right ventricle: Systolic function was normal. - Right atrium: The atrium was normal in size. - Pulmonary arteries: Systolic pressure was within the normal   range. - Inferior vena cava: The vessel was normal in size. - Pericardium, extracardiac: There was no pericardial effusion.   Mid LAD lesion, 90 %stenosed.  Ost LM to LM lesion, 30 %stenosed.  Prox Cx to Mid Cx lesion, 40 %stenosed.  1st Mrg lesion, 60 %stenosed.  Ramus lesion, 40 %stenosed.  Prox RCA to Mid RCA lesion, 70 %stenosed.  Mid RCA lesion, 80 %stenosed.  Dist RCA  lesion, 0 %stenosed.   Assessment:  1. Heavily calcified coronary arteries 2. High grade lesion in midLAD (90%) and borderline lesion midRCA (70-80%) 3. LVEDP 17   A STENT SYNERGY DES 3X16 drug eluting stent was successfully placed.  Mid LAD lesion, 90 %stenosed.  Post intervention, there is a 0% residual stenosis.   1. Successful orbital atherectomy and stenting of the mid LAD with DES  ASSESSMENT AND PLAN:  1.  SVT: Had multiple episodes of narrow complex tachycardia. In assessing his EKG, it appears that he has a short RP tachycardia that is most likely AVNRT, but ORT cannot be ruled out. I discussed with him the options of therapy including ablation. Risks and benefits of ablation were discussed. Risks include bleeding, tamponade, heart block, stroke among others. He understands these risks and has agreed to the procedure.  2. Chronic systolic heart failure: Likely mixed ischemic and nonischemic. Volume status managed by dialysis. Most recent EF 40-45%.  3. Coronary artery disease: Status post PCI of the LAD in 11 2017 with recurrent an STEMI due to SVT 10/2016. Has been unable to tolerate statins due to myositis. Continue dual antiplatelet therapy.  4. Hypertension: Periods of low blood pressure with dialysis. Continue Midrin on dialysis days. Blood pressure is elevated today, but with hypotension during dialysis Camey Edell not change any medications.    Current medicines are reviewed at length with the patient today.   The patient does not have concerns regarding his medicines.  The following changes were made today:  none  Labs/ tests ordered today include:  Orders Placed This Encounter  Procedures  . Basic Metabolic Panel (BMET)  . CBC  . EKG 12-Lead     Disposition:   FU with Bohden Dung 1.5 months  Signed, Zyden Suman Meredith Leeds, MD  11/20/2016 8:34 AM     [facility] [address] [place] [address] [phone] (office) [phone]  (fax)

## 2016-11-20 NOTE — Patient Instructions (Addendum)
Medication Instructions:  Your physician recommends that you continue on your current medications as directed. Please refer to the Current Medication list given to you today.   Labwork: TODAY - BMET/CBC   Testing/Procedures: Your physician has recommended that you have an ablation. Catheter ablation is a medical procedure used to treat some cardiac arrhythmias (irregular heartbeats). During catheter ablation, a long, thin, flexible tube is put into a blood vessel in your groin (upper thigh), or neck. This tube is called an ablation catheter. It is then guided to your heart through the blood vessel. Radio frequency waves destroy small areas of heart tissue where abnormal heartbeats may cause an arrhythmia to start. Please see the instruction sheet given to you today.  Follow-Up: Follow-up appointment and 4 weeks with Dr. Curt Bears from 11/26/16  Any Other Special Instructions Will Be Listed Below (If Applicable).  Please report to the [facility] on 11/26/16 at 5:30 AM  Nothing to eat or drink after midnight prior to procedure  Do not take any medication prior to procedure  Plan 1 night stay     If you need a refill on your cardiac medications before your next appointment, please call your pharmacy.

## 2016-11-21 ENCOUNTER — Encounter (HOSPITAL_COMMUNITY)
Admission: RE | Admit: 2016-11-21 | Discharge: 2016-11-21 | Disposition: A | Discharge status: Home / Self Care | Payer: Medicare Other | Source: Ambulatory Visit | Attending: Internal Medicine | Admitting: Internal Medicine

## 2016-11-21 DIAGNOSIS — Z955 Presence of coronary angioplasty implant and graft: Secondary | ICD-10-CM

## 2016-11-21 DIAGNOSIS — I214 Non-ST elevation (NSTEMI) myocardial infarction: Secondary | ICD-10-CM

## 2016-11-21 NOTE — Progress Notes (Signed)
Pt arrived at cardiac rehab with BP  90/64. Pt asymptomatic.  Recheck BP:  104/64.  Pt did not exercise.  Pt right wrist in splint for soft tissue swelling and pain. Pt is scheduled for SVT ablation 11/25/16.  Pt instructed to hold exercise for 1 week following ablation, then may return as directed by Dr. Curt Bears.  Pt also instructed to hold exercise on 11/23/16 if he continues to have wrist pain and swelling requiring brace. Pt and wife verbalized understanding.

## 2016-11-22 DIAGNOSIS — N186 End stage renal disease: Secondary | ICD-10-CM | POA: Diagnosis not present

## 2016-11-22 DIAGNOSIS — N2581 Secondary hyperparathyroidism of renal origin: Secondary | ICD-10-CM | POA: Diagnosis not present

## 2016-11-22 DIAGNOSIS — D631 Anemia in chronic kidney disease: Secondary | ICD-10-CM | POA: Diagnosis not present

## 2016-11-22 DIAGNOSIS — E119 Type 2 diabetes mellitus without complications: Secondary | ICD-10-CM | POA: Diagnosis not present

## 2016-11-23 ENCOUNTER — Encounter (HOSPITAL_COMMUNITY): Payer: Medicare Other

## 2016-11-24 DIAGNOSIS — E119 Type 2 diabetes mellitus without complications: Secondary | ICD-10-CM | POA: Diagnosis not present

## 2016-11-24 DIAGNOSIS — N186 End stage renal disease: Secondary | ICD-10-CM | POA: Diagnosis not present

## 2016-11-24 DIAGNOSIS — D631 Anemia in chronic kidney disease: Secondary | ICD-10-CM | POA: Diagnosis not present

## 2016-11-24 DIAGNOSIS — N2581 Secondary hyperparathyroidism of renal origin: Secondary | ICD-10-CM | POA: Diagnosis not present

## 2016-11-26 ENCOUNTER — Ambulatory Visit (HOSPITAL_COMMUNITY)
Admission: RE | Admit: 2016-11-26 | Discharge: 2016-11-26 | Disposition: A | Discharge status: Home / Self Care | Payer: Medicare Other | Source: Ambulatory Visit | Attending: Cardiology | Admitting: Cardiology

## 2016-11-26 ENCOUNTER — Encounter (HOSPITAL_COMMUNITY): Payer: Medicare Other

## 2016-11-26 ENCOUNTER — Encounter (HOSPITAL_COMMUNITY): Payer: Self-pay | Admitting: Cardiology

## 2016-11-26 ENCOUNTER — Encounter (HOSPITAL_COMMUNITY)
Admission: RE | Disposition: A | Discharge status: Home / Self Care | Payer: Self-pay | Source: Ambulatory Visit | Attending: Cardiology

## 2016-11-26 DIAGNOSIS — I471 Supraventricular tachycardia: Secondary | ICD-10-CM | POA: Insufficient documentation

## 2016-11-26 HISTORY — PX: SVT ABLATION: EP1225

## 2016-11-26 LAB — GLUCOSE, CAPILLARY
GLUCOSE-CAPILLARY: 153 mg/dL — AB (ref 65–99)
GLUCOSE-CAPILLARY: 171 mg/dL — AB (ref 65–99)
Glucose-Capillary: 153 mg/dL — ABNORMAL HIGH (ref 65–99)

## 2016-11-26 LAB — BASIC METABOLIC PANEL
ANION GAP: 17 — AB (ref 5–15)
BUN: 41 mg/dL — AB (ref 6–20)
CHLORIDE: 102 mmol/L (ref 101–111)
CO2: 24 mmol/L (ref 22–32)
Calcium: 9.6 mg/dL (ref 8.9–10.3)
Creatinine, Ser: 11.68 mg/dL — ABNORMAL HIGH (ref 0.61–1.24)
GFR calc Af Amer: 5 mL/min — ABNORMAL LOW (ref >60)
GFR, EST NON AFRICAN AMERICAN: 4 mL/min — AB (ref >60)
GLUCOSE: 163 mg/dL — AB (ref 65–99)
POTASSIUM: 4.6 mmol/L (ref 3.5–5.1)
Sodium: 143 mmol/L (ref 135–145)

## 2016-11-26 SURGERY — SVT ABLATION

## 2016-11-26 MED ORDER — SODIUM CHLORIDE 0.9% FLUSH: 3.0000 mL | INTRAVENOUS | Status: DC | PRN

## 2016-11-26 MED ORDER — SODIUM CHLORIDE 0.9% FLUSH: 3.0000 mL | Freq: Two times a day (BID) | INTRAVENOUS | Status: DC

## 2016-11-26 MED ORDER — MIDAZOLAM HCL 5 MG/5ML IJ SOLN
INTRAMUSCULAR | Status: AC
  Filled 2016-11-26: qty 5

## 2016-11-26 MED ORDER — MIDAZOLAM HCL 5 MG/5ML IJ SOLN
INTRAMUSCULAR | Status: DC | PRN
  Administered 2016-11-26 (×5): 1 mg via INTRAVENOUS

## 2016-11-26 MED ORDER — ACETAMINOPHEN 325 MG PO TABS
650.0000 mg | ORAL_TABLET | ORAL | Status: DC | PRN
  Filled 2016-11-26: qty 2

## 2016-11-26 MED ORDER — FENTANYL CITRATE (PF) 100 MCG/2ML IJ SOLN
INTRAMUSCULAR | Status: AC
  Filled 2016-11-26: qty 2

## 2016-11-26 MED ORDER — ONDANSETRON HCL 4 MG/2ML IJ SOLN: 4.0000 mg | Freq: Four times a day (QID) | INTRAMUSCULAR | Status: DC | PRN

## 2016-11-26 MED ORDER — BUPIVACAINE HCL (PF) 0.25 % IJ SOLN
INTRAMUSCULAR | Status: AC
  Filled 2016-11-26: qty 60

## 2016-11-26 MED ORDER — CARVEDILOL 3.125 MG PO TABS
3.1250 mg | ORAL_TABLET | Freq: Once | ORAL | Status: AC
  Administered 2016-11-26: 3.125 mg via ORAL
  Filled 2016-11-26: qty 1

## 2016-11-26 MED ORDER — HEPARIN (PORCINE) IN NACL 2-0.9 UNIT/ML-% IJ SOLN
INTRAMUSCULAR | Status: DC | PRN
  Administered 2016-11-26: 500 mL

## 2016-11-26 MED ORDER — HEPARIN (PORCINE) IN NACL 2-0.9 UNIT/ML-% IJ SOLN
INTRAMUSCULAR | Status: AC
  Filled 2016-11-26: qty 500

## 2016-11-26 MED ORDER — SODIUM CHLORIDE 0.9 % IV SOLN: 250.0000 mL | INTRAVENOUS | Status: DC | PRN

## 2016-11-26 MED ORDER — SODIUM CHLORIDE 0.9 % IV SOLN
INTRAVENOUS | Status: DC | PRN
  Administered 2016-11-26: 10 mL/h via INTRAVENOUS

## 2016-11-26 MED ORDER — BUPIVACAINE HCL (PF) 0.25 % IJ SOLN
INTRAMUSCULAR | Status: DC | PRN
  Administered 2016-11-26: 45 mL

## 2016-11-26 MED ORDER — FENTANYL CITRATE (PF) 100 MCG/2ML IJ SOLN
INTRAMUSCULAR | Status: DC | PRN
  Administered 2016-11-26 (×4): 12.5 ug via INTRAVENOUS

## 2016-11-26 SURGICAL SUPPLY — 12 items
BAG SNAP BAND KOVER 36X36 (MISCELLANEOUS) ×3 IMPLANT
CATH EZ STEER NAV 4MM D-F CUR (ABLATOR) ×3 IMPLANT
CATH JOSEPH QUAD ALLRED 6F REP (CATHETERS) ×6 IMPLANT
CATH WEBSTER BI DIR CS D-F CRV (CATHETERS) ×3 IMPLANT
PACK EP LATEX FREE (CUSTOM PROCEDURE TRAY) ×2
PACK EP LF (CUSTOM PROCEDURE TRAY) ×1 IMPLANT
PAD DEFIB LIFELINK (PAD) ×3 IMPLANT
PATCH CARTO3 (PAD) ×3 IMPLANT
SHEATH PINNACLE 6F 10CM (SHEATH) ×6 IMPLANT
SHEATH PINNACLE 7F 10CM (SHEATH) ×3 IMPLANT
SHEATH PINNACLE 8F 10CM (SHEATH) ×3 IMPLANT
SHIELD RADPAD SCOOP 12X17 (MISCELLANEOUS) ×3 IMPLANT

## 2016-11-26 NOTE — Progress Notes (Addendum)
Site area: Rt fem and lt fem venous sheaths Site Prior to Removal:  Level 0 Pressure Applied For: 22min to rt/ 78min lt Manual:   yes Patient Status During Pull:  A/O Post Pull Site:  Level Lt side 0// rt side 0 Post Pull Instructions Given:  Pt understands instructions Post Pull Pulses Present:  1+ rt dp and 1+ Lt dp Dressing Applied:  tegaderm and a 4x4 applied to Rt and lt groins Bedrest begins @ 10:30:00 Comments: Pt leaves cath lab holding area in stable condition. Rt/Lt groin sites are unremarkable. Bilateral dressing sites are CDI.

## 2016-11-26 NOTE — Progress Notes (Signed)
Dr Curt Bears in and notified of B/P and order noted

## 2016-11-26 NOTE — Discharge Instructions (Signed)
Cardiac Ablation Cardiac ablation is a procedure to stop some heart tissue from causing problems. The heart has many electrical connections. Sometimes these connections cause the heart to beat very fast or irregularly. Removing some of the problem areas can improve heart rhythm or make it normal. Ablation is done for people who:  Have Wolff-Parkinson-White syndrome.  Have other fast heart rhythms (tachycardia).  Have taken medicines for an abnormal heart rhythm (arrhythmia) and the medicines had:  No success.  Side effects.  May have a type of heartbeat that could cause death. What happens before the procedure?  Follow instructions from your doctor about eating and drinking before the procedure.  Take your medicines as told by your doctor. Take them at regular times with water unless told differently by your doctor.  If you are taking diabetes medicine, ask your doctor how to take it. Ask if there are any special instructions you should follow. Your doctor may change how much insulin you take the day of the procedure. What happens during the procedure?  A special type of X-ray will be used. The X-ray helps your doctor see images of your heart during the procedure.  A small cut (incision) will be made in your neck or groin.  An IV tube will be started before the procedure begins.  You will be given a numbing medicine (anesthetic) or a medicine to help you relax (sedative).  The skin on your neck or groin will be numbed.  A needle will be put into a large vein in your neck or groin.  A thin, flexible tube (catheter) will be put in to reach your heart.  A dye will be put in the tube. The dye will show up on X-rays. It will help your doctor see the area of the heart that needs treatment.  When the heart tissue that is causing problems is found, the tip of the tube will send an electrical current to it. This will stop it from causing problems.  The tube will be taken  out.  Pressure will be put on the area where the tube was. This will keep it from bleeding. A bandage will be placed over the area. What happens after the procedure?  You will be taken to a recovery area. Your blood pressure, heart rate, and breathing will be watched. The area where the tube was will also be watched for bleeding.  You will need to lie still for 4-6 hours. This keeps the area where the tube was from bleeding. This information is not intended to replace advice given to you by your health care provider. Make sure you discuss any questions you have with your health care provider. Document Released: 05/20/2013 Document Revised: 02/23/2016 Document Reviewed: 02/12/2013 Elsevier Interactive Patient Education  2017 [employer].    No driving for 3 days. No lifting over 5 lbs for 1 week. No sexual activity for 1 week. Keep procedure site clean & dry. If you notice increased pain, swelling, bleeding or pus, call/return!  You may shower, but no soaking baths/hot tubs/pools for 1 week.       Femoral Site Care Introduction Refer to this sheet in the next few weeks. These instructions provide you with information about caring for yourself after your procedure. Your health care provider may also give you more specific instructions. Your treatment has been planned according to current medical practices, but problems sometimes occur. Call your health care provider if you have any problems or questions after your procedure. What  can I expect after the procedure? After your procedure, it is typical to have the following:  Bruising at the site that usually fades within 1-2 weeks.  Blood collecting in the tissue (hematoma) that may be painful to the touch. It should usually decrease in size and tenderness within 1-2 weeks. Follow these instructions at home:  Take medicines only as directed by your health care provider.  You may shower 24-48 hours after the procedure or as directed by  your health care provider. Remove the bandage (dressing) and gently wash the site with plain soap and water. Pat the area dry with a clean towel. Do not rub the site, because this may cause bleeding.  Do not take baths, swim, or use a hot tub until your health care provider approves.  Check your insertion site every day for redness, swelling, or drainage.  Do not apply powder or lotion to the site.  Limit use of stairs to twice a day for the first 2-3 days or as directed by your health care provider.  Do not squat for the first 2-3 days or as directed by your health care provider.  Do not lift over 10 lb (4.5 kg) for 5 days after your procedure or as directed by your health care provider.  Ask your health care provider when it is okay to:  Return to work or school.  Resume usual physical activities or sports.  Resume sexual activity.  Do not drive home if you are discharged the same day as the procedure. Have someone else drive you.  You may drive 24 hours after the procedure unless otherwise instructed by your health care provider.  Do not operate machinery or power tools for 24 hours after the procedure or as directed by your health care provider.  If your procedure was done as an outpatient procedure, which means that you went home the same day as your procedure, a responsible adult should be with you for the first 24 hours after you arrive home.  Keep all follow-up visits as directed by your health care provider. This is important. Contact a health care provider if:  You have a fever.  You have chills.  You have increased bleeding from the site. Hold pressure on the site. Get help right away if:  You have unusual pain at the site.  You have redness, warmth, or swelling at the site.  You have drainage (other than a small amount of blood on the dressing) from the site.  The site is bleeding, and the bleeding does not stop after 30 minutes of holding steady pressure on  the site.  Your leg or foot becomes pale, cool, tingly, or numb. This information is not intended to replace advice given to you by your health care provider. Make sure you discuss any questions you have with your health care provider. Document Released: 05/21/2014 Document Revised: 02/23/2016 Document Reviewed: 04/06/2014  2017 Elsevier

## 2016-11-26 NOTE — H&P (Signed)
[name] is a 67 y.o. male with a history of narrow complex tachycardia. His tachycardia has converted with adenosine. He presents today for ablation of SVT. On exam, regular rhythm, no murmurs, lungs clear. Risks and benefits of ablation were explained. Risks include bleeding, tamponade, heart block, and stroke, among others. He understands these risks and has agreed to the procedure.  [name], MD 11/26/2016 7:11 AM

## 2016-11-26 NOTE — Progress Notes (Signed)
Report received from [facility]

## 2016-11-26 NOTE — Progress Notes (Signed)
Dr Curt Bears notified EKG shows normal sinus rhythm and ok to discharge home

## 2016-11-27 DIAGNOSIS — D631 Anemia in chronic kidney disease: Secondary | ICD-10-CM | POA: Diagnosis not present

## 2016-11-27 DIAGNOSIS — N186 End stage renal disease: Secondary | ICD-10-CM | POA: Diagnosis not present

## 2016-11-27 DIAGNOSIS — E119 Type 2 diabetes mellitus without complications: Secondary | ICD-10-CM | POA: Diagnosis not present

## 2016-11-27 DIAGNOSIS — N2581 Secondary hyperparathyroidism of renal origin: Secondary | ICD-10-CM | POA: Diagnosis not present

## 2016-11-28 ENCOUNTER — Encounter (HOSPITAL_COMMUNITY): Payer: Medicare Other

## 2016-11-28 DIAGNOSIS — N186 End stage renal disease: Secondary | ICD-10-CM | POA: Diagnosis not present

## 2016-11-28 DIAGNOSIS — Z992 Dependence on renal dialysis: Secondary | ICD-10-CM | POA: Diagnosis not present

## 2016-11-28 DIAGNOSIS — E1129 Type 2 diabetes mellitus with other diabetic kidney complication: Secondary | ICD-10-CM | POA: Diagnosis not present

## 2016-11-29 DIAGNOSIS — D631 Anemia in chronic kidney disease: Secondary | ICD-10-CM | POA: Diagnosis not present

## 2016-11-29 DIAGNOSIS — E119 Type 2 diabetes mellitus without complications: Secondary | ICD-10-CM | POA: Diagnosis not present

## 2016-11-29 DIAGNOSIS — N2581 Secondary hyperparathyroidism of renal origin: Secondary | ICD-10-CM | POA: Diagnosis not present

## 2016-11-29 DIAGNOSIS — N186 End stage renal disease: Secondary | ICD-10-CM | POA: Diagnosis not present

## 2016-11-30 ENCOUNTER — Telehealth (HOSPITAL_COMMUNITY): Payer: Self-pay | Admitting: Cardiac Rehabilitation

## 2016-11-30 ENCOUNTER — Encounter (HOSPITAL_COMMUNITY)
Admission: RE | Admit: 2016-11-30 | Discharge: 2016-11-30 | Disposition: A | Discharge status: Home / Self Care | Payer: Medicare Other | Source: Ambulatory Visit | Attending: Internal Medicine | Admitting: Internal Medicine

## 2016-11-30 DIAGNOSIS — I214 Non-ST elevation (NSTEMI) myocardial infarction: Secondary | ICD-10-CM | POA: Insufficient documentation

## 2016-11-30 DIAGNOSIS — Z48812 Encounter for surgical aftercare following surgery on the circulatory system: Secondary | ICD-10-CM | POA: Insufficient documentation

## 2016-11-30 DIAGNOSIS — Z955 Presence of coronary angioplasty implant and graft: Secondary | ICD-10-CM | POA: Diagnosis not present

## 2016-11-30 DIAGNOSIS — M11231 Other chondrocalcinosis, right wrist: Secondary | ICD-10-CM | POA: Diagnosis not present

## 2016-11-30 DIAGNOSIS — M19031 Primary osteoarthritis, right wrist: Secondary | ICD-10-CM | POA: Diagnosis not present

## 2016-11-30 NOTE — Telephone Encounter (Signed)
pc to pt to advise.  LMOM to return call.

## 2016-11-30 NOTE — Progress Notes (Signed)
Cardiac Individual Treatment Plan  Patient Details  Name: [name] MRN: [id number] Date of Birth: [birth year] Referring Provider:   Flowsheet Row CARDIAC REHAB PHASE II ORIENTATION from 10/04/2016 in [place]  Referring Provider  Glori Bickers MD      Initial Encounter Date:  [place] PHASE II ORIENTATION from 10/04/2016 in [place]  Date  10/04/16  Referring Provider  Glori Bickers MD      Visit Diagnosis: No diagnosis found.  Patient's Home Medications on Admission:  Current Outpatient Prescriptions:  .  acetaminophen (TYLENOL) 325 MG tablet, Take 650 mg by mouth every 6 (six) hours as needed for mild pain or headache. , Disp: , Rfl:  .  allopurinol (ZYLOPRIM) 100 MG tablet, Take 1 tablet (100 mg total) by mouth daily., Disp: 90 tablet, Rfl: 3 .  aspirin 81 MG tablet, Take 81 mg by mouth daily., Disp: , Rfl:  .  carvedilol (COREG) 3.125 MG tablet, Take 1 tablet (3.125 mg total) by mouth 2 (two) times daily. ON NON-HD DAYS, Disp: 60 tablet, Rfl: 3 .  citalopram (CELEXA) 10 MG tablet, Take 10 mg by mouth 3 (three) times a week., Disp: , Rfl:  .  clopidogrel (PLAVIX) 75 MG tablet, Take 1 tablet (75 mg total) by mouth daily with breakfast., Disp: 30 tablet, Rfl: 6 .  colesevelam (WELCHOL) 625 MG tablet, TAKE THREE TABLETS BY MOUTH TWICE DAILY WITH MEALS, Disp: 180 tablet, Rfl: 11 .  ezetimibe (ZETIA) 10 MG tablet, Take 1 tablet (10 mg total) by mouth daily., Disp: 90 tablet, Rfl: 3 .  gabapentin (NEURONTIN) 300 MG capsule, 1-2 tabs by mouth at bedtime for sleep and pain, Disp: 180 capsule, Rfl: 3 .  Lanthanum Carbonate 1000 MG PACK, Take 2 packets by mouth 2 (two) times daily., Disp: , Rfl:  .  methimazole (TAPAZOLE) 5 MG tablet, Take 1 tablet (5 mg total) by mouth daily., Disp: 30 tablet, Rfl: 11 .  midodrine (PROAMATINE) 10 MG tablet, Take 1 tablet (10 mg total) by mouth See admin  instructions. Once a day only on dialysis days (Tues/Thurs/Sat), Disp: 30 tablet, Rfl: 0 .  multivitamin (RENA-VIT) TABS tablet, Take 1 tablet by mouth at bedtime. Reported on 10/24/2015, Disp: , Rfl:  .  pantoprazole (PROTONIX) 40 MG tablet, Take 40 mg by mouth 2 (two) times daily. , Disp: , Rfl:  .  SENSIPAR 90 MG tablet, Take 90 mg by mouth 2 (two) times daily., Disp: , Rfl:   Past Medical History: Past Medical History:  Diagnosis Date  . Allergic rhinitis, cause unspecified 02/24/2014  . Anemia 06/16/2011  . BENIGN PROSTATIC HYPERTROPHY 10/14/2009  . CAD, NATIVE VESSEL 02/06/2009   saw Dr. Missy Sabins last jan 2013  . Cervical radiculopathy, chronic 02/23/2016   Right c5-6 by [facility]/EMG  . CHEST PAIN 03/29/2010  . COLONIC POLYPS, HX OF 10/14/2009  . CONGESTIVE HEART FAILURE 06/18/2007  . Dementia [id number]  . DEPRESSION 10/14/2009  . Depression 09/24/2015  . DIABETES MELLITUS, TYPE II 02/01/2010  . DIASTOLIC HEART FAILURE, CHRONIC 02/06/2009  . DIZZINESS 07/17/2010  . DYSLIPIDEMIA 06/18/2007  . DYSPNEA 10/29/2008  . ESRD (end stage renal disease) on dialysis ([place]) 08/04/2010   "TTS;  " (04/18/2015)  . FOOT PAIN 08/12/2008  . GAIT DISTURBANCE 03/03/2010  . GASTROENTERITIS, VIRAL 10/14/2009  . GERD 06/18/2007  . GOITER, MULTINODULAR 12/26/2007  . GOUT 06/18/2007  . GYNECOMASTIA 07/17/2010  . Hemodialysis access, fistula mature ([place])  Dialysis [facility] ([place]) Right upper arm fistula  . Hyperlipidemia 10/16/2011  . Hyperparathyroidism, secondary ([place]) 06/16/2011  . HYPERTENSION 06/18/2007  . Hyperthyroidism   . HYPERTHYROIDISM 02/02/2010  . Hypocalcemia 06/07/2010  . Ischemic cardiomyopathy 06/16/2011  . NECK PAIN 07/31/2010  . NSTEMI (non-ST elevated myocardial infarction) ([place]) 08/04/2016  . ONYCHOMYCOSIS, TOENAILS 12/26/2007  . OSA on CPAP 10/16/2011  . Other malaise and fatigue 11/24/2009  . PERIPHERAL NEUROPATHY 06/18/2007  . Prostate cancer ([place])   . PULMONARY NODULE, RIGHT  LOWER LOBE 06/08/2009  . Renal insufficiency   . Sleep apnea    cpap machine and o2  . TRANSAMINASES, SERUM, ELEVATED 02/01/2010  . Transfusion history    none recent  . Unspecified hypotension 01/30/2010    Tobacco Use: History  Smoking Status  . Former Smoker  . Packs/day: 1.00  . Years: 25.00  . Types: Cigarettes  . Quit date: 10/01/2005  Smokeless Tobacco  . Former Systems developer  . Quit date: 10/01/2005    Comment: Quit smoking 2007 Smoked x 25 years 1/2 ppd.    Labs: Recent Review Flowsheet Data    Labs for ITP Cardiac and Pulmonary Rehab Latest Ref Rng & Units 08/04/2016 10/16/2016 10/16/2016 10/18/2016 10/24/2016   Cholestrol 0 - 200 mg/dL - - - 268(H) -   LDLCALC 0 - 99 mg/dL - - - 170(H) -   LDLDIRECT mg/dL - - - - -   HDL >40 mg/dL - - - 46 -   Trlycerides <150 mg/dL - - - 262(H) -   Hemoglobin A1c - - - - - 5.5   PHART 7.350 - 7.450 - - - - -   PCO2ART 35.0 - 45.0 mmHg - - - - -   HCO3 20.0 - 28.0 mmol/L - 30.0(H) - - -   TCO2 0 - 100 mmol/L 30 32 29 - -   O2SAT % - 79.0 - - -      Capillary Blood Glucose: Lab Results  Component Value Date   GLUCAP 171 (H) 11/26/2016   GLUCAP 153 (H) 11/26/2016   GLUCAP 153 (H) 11/26/2016   GLUCAP 144 (H) 11/02/2016   GLUCAP 143 (H) 10/29/2016     Exercise Target Goals:    Exercise Program Goal: Individual exercise prescription set with THRR, safety & activity barriers. Participant demonstrates ability to understand and report RPE using BORG scale, to self-measure pulse accurately, and to acknowledge the importance of the exercise prescription.  Exercise Prescription Goal: Starting with aerobic activity 30 plus minutes a day, 3 days per week for initial exercise prescription. Provide home exercise prescription and guidelines that participant acknowledges understanding prior to discharge.  Activity Barriers & Risk Stratification:     Activity Barriers & Cardiac Risk Stratification - 10/04/16 0858      Activity Barriers & Cardiac  Risk Stratification   Activity Barriers Balance Concerns;History of Falls;Muscular Weakness;Deconditioning;Assistive Device;Other (comment)   Comments L knee pain   Cardiac Risk Stratification High      6 Minute Walk:     6 Minute Walk    Row Name 10/04/16 1232         6 Minute Walk   Phase Initial     Distance 0.35 feet     Walk Time 6 minutes     # of Rest Breaks 0     MPH 0.21     METS 2.1     RPE 11     VO2 Peak 7.35  Symptoms No     Resting HR 79 bpm     Resting BP 170/86     Max Ex. HR 108 bpm     Max Ex. BP 158/80     2 Minute Post BP 158/86  10 minute post 158/80        Oxygen Initial Assessment:   Oxygen Re-Evaluation:   Oxygen Discharge (Final Oxygen Re-Evaluation):   Initial Exercise Prescription:     Initial Exercise Prescription - 10/04/16 1300      Date of Initial Exercise RX and Referring Provider   Date 10/04/16   Referring Provider Bensimhon, Daniel MD     Recumbant Bike   Level 2   Minutes 10   METs 2     NuStep   Level 2   Minutes 10   METs 2     Arm Ergometer   Level 1   Minutes 10   METs 2     Prescription Details   Frequency (times per week) 3   Duration Progress to 30 minutes of continuous aerobic without signs/symptoms of physical distress     Intensity   THRR 40-80% of Max Heartrate 65-131   Ratings of Perceived Exertion 11-13   Perceived Dyspnea 0-4     Progression   Progression Continue progressive overload as per policy without signs/symptoms or physical distress.     Resistance Training   Training Prescription Yes   Weight 2lbs   Reps 10-12      Perform Capillary Blood Glucose checks as needed.  Exercise Prescription Changes:     Exercise Prescription Changes    Row Name 10/30/16 1600 11/28/16 1800           Response to Exercise   Blood Pressure (Admit) 130/70 144/90      Blood Pressure (Exercise) 114/62 138/82      Blood Pressure (Exit) 120/72 140/82      Heart Rate (Admit) 9 bpm 91  bpm      Heart Rate (Exercise) 114 bpm 115 bpm      Heart Rate (Exit) 102 bpm 92 bpm      Rating of Perceived Exertion (Exercise) 11 13      Symptoms none none      Duration Progress to 30 minutes of continuous aerobic without signs/symptoms of physical distress Continue with 30 min of aerobic exercise without signs/symptoms of physical distress.      Intensity THRR unchanged THRR unchanged        Progression   Average METs 2 2.1        Resistance Training   Training Prescription Yes Yes      Weight 2lbs 3lbs      Reps 10-12 10-15      Time  - 10 Minutes        Recumbant Bike   Level 2 2      Minutes 15 15      METs 2 2.1        NuStep   Level 3 3      Minutes 15 15      METs 2 1.9        Exercise Review   Progression Yes Yes         Exercise Comments:     Exercise Comments    Row Name 10/30/16 1632 11/28/16 1809         Exercise Comments Reviewed METs and goals. Pt is tolerating exercise fairly well; will continue to monitor exercise progression.  Reviewed METs and goals. Pt is tolerating exercise fairly well; will continue to monitor exercise progression.         Exercise Goals and Review:     Exercise Goals    Row Name 11/28/16 1807             Exercise Goals   Increase Physical Activity Yes       Intervention Provide advice, education, support and counseling about physical activity/exercise needs.;Develop an individualized exercise prescription for aerobic and resistive training based on initial evaluation findings, risk stratification, comorbidities and participant's personal goals.       Expected Outcomes Achievement of increased cardiorespiratory fitness and enhanced flexibility, muscular endurance and strength shown through measurements of functional capacity and personal statement of participant.       Increase Strength and Stamina Yes       Intervention Provide advice, education, support and counseling about physical activity/exercise needs.;Develop  an individualized exercise prescription for aerobic and resistive training based on initial evaluation findings, risk stratification, comorbidities and participant's personal goals.       Expected Outcomes Achievement of increased cardiorespiratory fitness and enhanced flexibility, muscular endurance and strength shown through measurements of functional capacity and personal statement of participant.          Exercise Goals Re-Evaluation :     Exercise Goals Re-Evaluation    Row Name 11/28/16 1807             Exercise Goal Re-Evaluation   Exercise Goals Review Increase Physical Activity;Increase Strenth and Stamina       Comments Pt has progressed to 30 minutes of continuous exercise in cardiac rehab. Pt has also increased handheld weight from 1 to 3 pounds       Expected Outcomes Pt will continue to exercise continuous for 30 minutes and increase in strength/stamina           Discharge Exercise Prescription (Final Exercise Prescription Changes):     Exercise Prescription Changes - 11/28/16 1800      Response to Exercise   Blood Pressure (Admit) 144/90   Blood Pressure (Exercise) 138/82   Blood Pressure (Exit) 140/82   Heart Rate (Admit) 91 bpm   Heart Rate (Exercise) 115 bpm   Heart Rate (Exit) 92 bpm   Rating of Perceived Exertion (Exercise) 13   Symptoms none   Duration Continue with 30 min of aerobic exercise without signs/symptoms of physical distress.   Intensity THRR unchanged     Progression   Average METs 2.1     Resistance Training   Training Prescription Yes   Weight 3lbs   Reps 10-15   Time 10 Minutes     Recumbant Bike   Level 2   Minutes 15   METs 2.1     NuStep   Level 3   Minutes 15   METs 1.9     Exercise Review   Progression Yes      Nutrition:  Target Goals: Understanding of nutrition guidelines, daily intake of sodium 1500mg , cholesterol 200mg , calories 30% from fat and 7% or less from saturated fats, daily to have 5 or more  servings of fruits and vegetables.  Biometrics:     Pre Biometrics - 10/04/16 1256      Pre Biometrics   Waist Circumference 48.5 inches   Hip Circumference 31.75 inches   Waist to Hip Ratio 1.53 %   Triceps Skinfold 37 mm   Grip Strength 26 kg   Flexibility 0 in  balance  concerns/balance deficit   Single Leg Stand 0 seconds       Nutrition Therapy Plan and Nutrition Goals:     Nutrition Therapy & Goals - 11/09/16 1415      Nutrition Therapy   Diet Renal, Carb Modified, Therapeutic Lifestyle Changes      Personal Nutrition Goals   Nutrition Goal 1-2 lb wt loss/week to a wt loss goal of 6-24 lb at graduation from rehab     Admire, educate and counsel regarding individualized specific dietary modifications aiming towards targeted core components such as weight, hypertension, lipid management, diabetes, heart failure and other comorbidities.   Expected Outcomes Short Term Goal: Understand basic principles of dietary content, such as calories, fat, sodium, cholesterol and nutrients.;Long Term Goal: Adherence to prescribed nutrition plan.      Nutrition Discharge: Nutrition Scores:     Nutrition Assessments - 11/09/16 1405      MEDFICTS Scores   Pre Score 33      Nutrition Goals Re-Evaluation:   Nutrition Goals Re-Evaluation:   Nutrition Goals Discharge (Final Nutrition Goals Re-Evaluation):   Psychosocial: Target Goals: Acknowledge presence or absence of significant depression and/or stress, maximize coping skills, provide positive support system. Participant is able to verbalize types and ability to use techniques and skills needed for reducing stress and depression.  Initial Review & Psychosocial Screening:     Initial Psych Review & Screening - 10/04/16 1835      Initial Review   Source of Stress Concerns Financial      Quality of Life Scores:     Quality of Life - 11/19/16 1545      Quality of Life Scores    Health/Function Pre --  pt quality of life altered by his health status. pt has concerns about his health, especially his energy level. pt routine is focused around his health care needs, pt goes to hemodialysis 3x/wk.  pt has desires to feel better.   GLOBAL Pre --  pt overall qol altered by health status.  pt offered reassurance and emotional support.        PHQ-9: Recent Review Flowsheet Data    Depression screen [facility] 2/9 10/15/2016 12/29/2015 10/24/2015 10/21/2015 01/13/2014   Decreased Interest 1 0 0 0 0   Down, Depressed, Hopeless 1 0 0 0 2   PHQ - 2 Score 2 0 0 0 2   Altered sleeping 1 - - - -   Tired, decreased energy 1 - - - -   Change in appetite 1 - - - -   Feeling bad or failure about yourself  0 - - - -   Trouble concentrating 1 - - - -   Moving slowly or fidgety/restless 0 - - - -   Suicidal thoughts 0 - - - -   PHQ-9 Score 6 - - - -   Difficult doing work/chores Somewhat difficult - - - -     Interpretation of Total Score  Total Score Depression Severity:  1-4 = Minimal depression, 5-9 = Mild depression, 10-14 = Moderate depression, 15-19 = Moderately severe depression, 20-27 = Severe depression   Psychosocial Evaluation and Intervention:     Psychosocial Evaluation - 10/15/16 1541      Psychosocial Evaluation & Interventions   Interventions Stress management education;Relaxation education;Encouraged to exercise with the program and follow exercise prescription   Comments pyschosocial barriers include hemodialysis schedule (3 days weekly)  and residual fatigue.  pt advised may need to  adjust CR schedule accordingly.  pt has stress and depression symptoms related to chronic illness.     Continue Psychosocial Services  Yes      Psychosocial Re-Evaluation:     Psychosocial Re-Evaluation    Row Name 10/29/16 0753 10/31/16 1653           Psychosocial Re-Evaluation   Comments pt with many comordities is presently out on medical leave.  psychosocial needs will be  reevaluated if pt is able to return to cardiac rehab.  pt returned to cardiac rehab and tolerating light activity without difficulty.  pt is pleased to be able to return. pt with multiple comorbidities which does indicate barriers to rehab participation., including end stage renal disease and diabetes.        Interventions  - Encouraged to attend Cardiac Rehabilitation for the exercise;Stress management education;Relaxation education      Continue Psychosocial Services  No Yes         Psychosocial Discharge (Final Psychosocial Re-Evaluation):     Psychosocial Re-Evaluation - 10/31/16 1653      Psychosocial Re-Evaluation   Comments pt returned to cardiac rehab and tolerating light activity without difficulty.  pt is pleased to be able to return. pt with multiple comorbidities which does indicate barriers to rehab participation., including end stage renal disease and diabetes.     Interventions Encouraged to attend Cardiac Rehabilitation for the exercise;Stress management education;Relaxation education   Continue Psychosocial Services  Yes      Vocational Rehabilitation: Provide vocational rehab assistance to qualifying candidates.   Vocational Rehab Evaluation & Intervention:     Vocational Rehab - 10/04/16 1832      Initial Vocational Rehab Evaluation & Intervention   Assessment shows need for Vocational Rehabilitation No  Pt unable to return to work due to disability.      Education: Education Goals: Education classes will be provided on a weekly basis, covering required topics. Participant will state understanding/return demonstration of topics presented.  Learning Barriers/Preferences:     Learning Barriers/Preferences - 10/04/16 1356      Learning Barriers/Preferences   Learning Barriers None      Education Topics: Count Your Pulse:  -Group instruction provided by verbal instruction, demonstration, patient participation and written materials to support subject.   Instructors address importance of being able to find your pulse and how to count your pulse when at home without a heart monitor.  Patients get hands on experience counting their pulse with staff help and individually.   Heart Attack, Angina, and Risk Factor Modification:  -Group instruction provided by verbal instruction, video, and written materials to support subject.  Instructors address signs and symptoms of angina and heart attacks.    Also discuss risk factors for heart disease and how to make changes to improve heart health risk factors.   Functional Fitness:  -Group instruction provided by verbal instruction, demonstration, patient participation, and written materials to support subject.  Instructors address safety measures for doing things around the house.  Discuss how to get up and down off the floor, how to pick things up properly, how to safely get out of a chair without assistance, and balance training.   Meditation and Mindfulness:  -Group instruction provided by verbal instruction, patient participation, and written materials to support subject.  Instructor addresses importance of mindfulness and meditation practice to help reduce stress and improve awareness.  Instructor also leads participants through a meditation exercise.    Stretching for Flexibility and Mobility:  -Group instruction  provided by verbal instruction, patient participation, and written materials to support subject.  Instructors lead participants through series of stretches that are designed to increase flexibility thus improving mobility.  These stretches are additional exercise for major muscle groups that are typically performed during regular warm up and cool down.   Hands Only CPR Anytime:  -Group instruction provided by verbal instruction, video, patient participation and written materials to support subject.  Instructors co-teach with AHA video for hands only CPR.  Participants get hands on experience  with mannequins.   Nutrition I class: Heart Healthy Eating:  -Group instruction provided by PowerPoint slides, verbal discussion, and written materials to support subject matter. The instructor gives an explanation and review of the Therapeutic Lifestyle Changes diet recommendations, which includes a discussion on lipid goals, dietary fat, sodium, fiber, plant stanol/sterol esters, sugar, and the components of a well-balanced, healthy diet. Flowsheet Row CARDIAC REHAB PHASE II EXERCISE from 11/09/2016 in [place]  Date  11/09/16  Educator  RD  Instruction Review Code  Not applicable [class handouts given]      Nutrition II class: Lifestyle Skills:  -Group instruction provided by PowerPoint slides, verbal discussion, and written materials to support subject matter. The instructor gives an explanation and review of label reading, grocery shopping for heart health, heart healthy recipe modifications, and ways to make healthier choices when eating out. Flowsheet Row CARDIAC REHAB PHASE II EXERCISE from 11/09/2016 in [place]  Date  11/09/16  Educator  RD  Instruction Review Code  Not applicable [class handouts given]      Diabetes Question & Answer:  -Group instruction provided by PowerPoint slides, verbal discussion, and written materials to support subject matter. The instructor gives an explanation and review of diabetes co-morbidities, pre- and post-prandial blood glucose goals, pre-exercise blood glucose goals, signs, symptoms, and treatment of hypoglycemia and hyperglycemia, and foot care basics.   Diabetes Blitz:  -Group instruction provided by PowerPoint slides, verbal discussion, and written materials to support subject matter. The instructor gives an explanation and review of the physiology behind type 1 and type 2 diabetes, diabetes medications and rational behind using different medications, pre- and post-prandial blood  glucose recommendations and Hemoglobin A1c goals, diabetes diet, and exercise including blood glucose guidelines for exercising safely.  Flowsheet Row CARDIAC REHAB PHASE II EXERCISE from 11/09/2016 in [place]  Date  11/09/16  Educator  RD  Instruction Review Code  Not applicable [class handouts given]      Portion Distortion:  -Group instruction provided by PowerPoint slides, verbal discussion, written materials, and food models to support subject matter. The instructor gives an explanation of serving size versus portion size, changes in portions sizes over the last 20 years, and what consists of a serving from each food group.   Stress Management:  -Group instruction provided by verbal instruction, video, and written materials to support subject matter.  Instructors review role of stress in heart disease and how to cope with stress positively.     Exercising on Your Own:  -Group instruction provided by verbal instruction, power point, and written materials to support subject.  Instructors discuss benefits of exercise, components of exercise, frequency and intensity of exercise, and end points for exercise.  Also discuss use of nitroglycerin and activating EMS.  Review options of places to exercise outside of rehab.  Review guidelines for sex with heart disease.   Cardiac Drugs I:  -Group instruction  provided by verbal instruction and written materials to support subject.  Instructor reviews cardiac drug classes: antiplatelets, anticoagulants, beta blockers, and statins.  Instructor discusses reasons, side effects, and lifestyle considerations for each drug class.   Cardiac Drugs II:  -Group instruction provided by verbal instruction and written materials to support subject.  Instructor reviews cardiac drug classes: angiotensin converting enzyme inhibitors (ACE-I), angiotensin II receptor blockers (ARBs), nitrates, and calcium channel blockers.  Instructor  discusses reasons, side effects, and lifestyle considerations for each drug class.   Anatomy and Physiology of the Circulatory System:  -Group instruction provided by verbal instruction, video, and written materials to support subject.  Reviews functional anatomy of heart, how it relates to various diagnoses, and what role the heart plays in the overall system.   Knowledge Questionnaire Score:     Knowledge Questionnaire Score - 11/09/16 1405      Knowledge Questionnaire Score   Pre Score 18/28     DM 12/15      Core Components/Risk Factors/Patient Goals at Admission:     Personal Goals and Risk Factors at Admission - 10/04/16 0859      Core Components/Risk Factors/Patient Goals on Admission    Weight Management Yes;Obesity;Weight Loss   Intervention Weight Management: Develop a combined nutrition and exercise program designed to reach desired caloric intake, while maintaining appropriate intake of nutrient and fiber, sodium and fats, and appropriate energy expenditure required for the weight goal.;Weight Management: Provide education and appropriate resources to help participant work on and attain dietary goals.;Weight Management/Obesity: Establish reasonable short term and long term weight goals.;Obesity: Provide education and appropriate resources to help participant work on and attain dietary goals.   Expected Outcomes Short Term: Continue to assess and modify interventions until short term weight is achieved;Weight Maintenance: Understanding of the daily nutrition guidelines, which includes 25-35% calories from fat, 7% or less cal from saturated fats, less than 200mg  cholesterol, less than 1.5gm of sodium, & 5 or more servings of fruits and vegetables daily;Understanding recommendations for meals to include 15-35% energy as protein, 25-35% energy from fat, 35-60% energy from carbohydrates, less than 200mg  of dietary cholesterol, 20-35 gm of total fiber daily;Weight Loss: Understanding  of general recommendations for a balanced deficit meal plan, which promotes 1-2 lb weight loss per week and includes a negative energy balance of [phone] kcal/d;Understanding of distribution of calorie intake throughout the day with the consumption of 4-5 meals/snacks;Weight Gain: Understanding of general recommendations for a high calorie, high protein meal plan that promotes weight gain by distributing calorie intake throughout the day with the consumption for 4-5 meals, snacks, and/or supplements   Sedentary Yes   Intervention Provide advice, education, support and counseling about physical activity/exercise needs.;Develop an individualized exercise prescription for aerobic and resistive training based on initial evaluation findings, risk stratification, comorbidities and participant's personal goals.   Expected Outcomes Achievement of increased cardiorespiratory fitness and enhanced flexibility, muscular endurance and strength shown through measurements of functional capacity and personal statement of participant.   Increase Strength and Stamina Yes   Intervention Provide advice, education, support and counseling about physical activity/exercise needs.;Develop an individualized exercise prescription for aerobic and resistive training based on initial evaluation findings, risk stratification, comorbidities and participant's personal goals.   Expected Outcomes Achievement of increased cardiorespiratory fitness and enhanced flexibility, muscular endurance and strength shown through measurements of functional capacity and personal statement of participant.   Improve shortness of breath with ADL's Yes   Intervention Provide education, individualized exercise plan and  daily activity instruction to help decrease symptoms of SOB with activities of daily living.   Expected Outcomes Short Term: Achieves a reduction of symptoms when performing activities of daily living.   Diabetes Yes   Intervention Provide  education about signs/symptoms and action to take for hypo/hyperglycemia.;Provide education about proper nutrition, including hydration, and aerobic/resistive exercise prescription along with prescribed medications to achieve blood glucose in normal ranges: Fasting glucose 65-99 mg/dL   Expected Outcomes Short Term: Participant verbalizes understanding of the signs/symptoms and immediate care of hyper/hypoglycemia, proper foot care and importance of medication, aerobic/resistive exercise and nutrition plan for blood glucose control.;Long Term: Attainment of HbA1C < 7%.   Lipids Yes   Intervention Provide education and support for participant on nutrition & aerobic/resistive exercise along with prescribed medications to achieve LDL 70mg , HDL >40mg .   Expected Outcomes Short Term: Participant states understanding of desired cholesterol values and is compliant with medications prescribed. Participant is following exercise prescription and nutrition guidelines.;Long Term: Cholesterol controlled with medications as prescribed, with individualized exercise RX and with personalized nutrition plan. Value goals: LDL < 70mg , HDL > 40 mg.   Stress Yes   Intervention Offer individual and/or small group education and counseling on adjustment to heart disease, stress management and health-related lifestyle change. Teach and support self-help strategies.;Refer participants experiencing significant psychosocial distress to appropriate mental health specialists for further evaluation and treatment. When possible, include family members and significant others in education/counseling sessions.   Expected Outcomes Short Term: Participant demonstrates changes in health-related behavior, relaxation and other stress management skills, ability to obtain effective social support, and compliance with psychotropic medications if prescribed.;Long Term: Emotional wellbeing is indicated by absence of clinically significant psychosocial  distress or social isolation.   Personal Goal Other Yes   Personal Goal short: improve strength, gait and stability    long: improve standing tolerance and UE strength   Intervention Provide exercise programming to assist with improving cardiovascular fitness and exercise tolerance. Provide an exercise routine to assist with improving strength, balance and stability.   Expected Outcomes Pt will be able to improve standing tolerance and global strength to improve overall quality of life.      Core Components/Risk Factors/Patient Goals Review:      Goals and Risk Factor Review    Row Name 10/31/16 1459 11/28/16 1816           Core Components/Risk Factors/Patient Goals Review   Personal Goals Review Increase Strength and Stamina Other      Review Pt stated still needs improvement in UE strength, however does feel better after each cardiac rehab session. Pt stated feeling muscle soreness in a good way; feels as though getting stronger Pt stated " balance and standing tolerance has improved but still rely on walker for added support."  Pt also reported " feeling confident in walking around the house."      Expected Outcomes Pt will continue to improve in cardiorespiratory fitness, strength and endurance Pt wll continue to improve in balance, gait and functional mobility         Core Components/Risk Factors/Patient Goals at Discharge (Final Review):      Goals and Risk Factor Review - 11/28/16 1816      Core Components/Risk Factors/Patient Goals Review   Personal Goals Review Other   Review Pt stated " balance and standing tolerance has improved but still rely on walker for added support."  Pt also reported " feeling confident in walking around the house."   Expected  Outcomes Pt wll continue to improve in balance, gait and functional mobility      ITP Comments:     ITP Comments    Row Name 10/04/16 0857           ITP Comments Dr. Fransico Him, Medical Director           Comments: Pt is making better than expected progress toward personal goals after completing 10 sessions.  Pt with frequent absences due to medical complications.   Recommend continued exercise and life style modification education including  stress management and relaxation techniques to decrease cardiac risk profile.

## 2016-11-30 NOTE — Telephone Encounter (Signed)
-----   Message from Will Meredith Leeds, MD sent at 11/30/2016  1:32 PM EST ----- Regarding: RE: cardiac rehab  Can return 1 week post procedure. ----- Message ----- From: Lowell Guitar, RN Sent: 11/30/2016  10:30 AM To: Constance Haw, MD Subject: cardiac rehab                                  Dear Dr. Curt Bears,  Pt is s/p SVT ablation 11/26/16.  Is it ok for him to return to cardiac rehab?  Thank you, Andi Hence, RN, BSN Cardiac Pulmonary Rehab

## 2016-12-01 DIAGNOSIS — N186 End stage renal disease: Secondary | ICD-10-CM | POA: Diagnosis not present

## 2016-12-01 DIAGNOSIS — E119 Type 2 diabetes mellitus without complications: Secondary | ICD-10-CM | POA: Diagnosis not present

## 2016-12-01 DIAGNOSIS — N2581 Secondary hyperparathyroidism of renal origin: Secondary | ICD-10-CM | POA: Diagnosis not present

## 2016-12-01 DIAGNOSIS — D631 Anemia in chronic kidney disease: Secondary | ICD-10-CM | POA: Diagnosis not present

## 2016-12-03 ENCOUNTER — Encounter (HOSPITAL_COMMUNITY): Admission: RE | Admit: 2016-12-03 | Payer: Medicare Other | Source: Ambulatory Visit

## 2016-12-04 DIAGNOSIS — D631 Anemia in chronic kidney disease: Secondary | ICD-10-CM | POA: Diagnosis not present

## 2016-12-04 DIAGNOSIS — N2581 Secondary hyperparathyroidism of renal origin: Secondary | ICD-10-CM | POA: Diagnosis not present

## 2016-12-04 DIAGNOSIS — E119 Type 2 diabetes mellitus without complications: Secondary | ICD-10-CM | POA: Diagnosis not present

## 2016-12-04 DIAGNOSIS — N186 End stage renal disease: Secondary | ICD-10-CM | POA: Diagnosis not present

## 2016-12-05 ENCOUNTER — Encounter (HOSPITAL_COMMUNITY): Payer: Medicare Other

## 2016-12-05 DIAGNOSIS — I871 Compression of vein: Secondary | ICD-10-CM | POA: Diagnosis not present

## 2016-12-05 DIAGNOSIS — T82858A Stenosis of vascular prosthetic devices, implants and grafts, initial encounter: Secondary | ICD-10-CM | POA: Diagnosis not present

## 2016-12-05 DIAGNOSIS — Z992 Dependence on renal dialysis: Secondary | ICD-10-CM | POA: Diagnosis not present

## 2016-12-05 DIAGNOSIS — N186 End stage renal disease: Secondary | ICD-10-CM | POA: Diagnosis not present

## 2016-12-06 DIAGNOSIS — E119 Type 2 diabetes mellitus without complications: Secondary | ICD-10-CM | POA: Diagnosis not present

## 2016-12-06 DIAGNOSIS — D631 Anemia in chronic kidney disease: Secondary | ICD-10-CM | POA: Diagnosis not present

## 2016-12-06 DIAGNOSIS — N2581 Secondary hyperparathyroidism of renal origin: Secondary | ICD-10-CM | POA: Diagnosis not present

## 2016-12-06 DIAGNOSIS — N186 End stage renal disease: Secondary | ICD-10-CM | POA: Diagnosis not present

## 2016-12-07 ENCOUNTER — Telehealth (HOSPITAL_COMMUNITY): Payer: Self-pay | Admitting: Internal Medicine

## 2016-12-07 ENCOUNTER — Encounter (HOSPITAL_COMMUNITY): Payer: Medicare Other

## 2016-12-07 DIAGNOSIS — M25531 Pain in right wrist: Secondary | ICD-10-CM | POA: Diagnosis not present

## 2016-12-07 DIAGNOSIS — M19031 Primary osteoarthritis, right wrist: Secondary | ICD-10-CM | POA: Diagnosis not present

## 2016-12-07 DIAGNOSIS — M10031 Idiopathic gout, right wrist: Secondary | ICD-10-CM | POA: Diagnosis not present

## 2016-12-07 DIAGNOSIS — M11231 Other chondrocalcinosis, right wrist: Secondary | ICD-10-CM | POA: Diagnosis not present

## 2016-12-08 DIAGNOSIS — N186 End stage renal disease: Secondary | ICD-10-CM | POA: Diagnosis not present

## 2016-12-08 DIAGNOSIS — D631 Anemia in chronic kidney disease: Secondary | ICD-10-CM | POA: Diagnosis not present

## 2016-12-08 DIAGNOSIS — N2581 Secondary hyperparathyroidism of renal origin: Secondary | ICD-10-CM | POA: Diagnosis not present

## 2016-12-08 DIAGNOSIS — E119 Type 2 diabetes mellitus without complications: Secondary | ICD-10-CM | POA: Diagnosis not present

## 2016-12-10 ENCOUNTER — Encounter (HOSPITAL_COMMUNITY): Admission: RE | Admit: 2016-12-10 | Payer: Medicare Other | Source: Ambulatory Visit

## 2016-12-11 DIAGNOSIS — D631 Anemia in chronic kidney disease: Secondary | ICD-10-CM | POA: Diagnosis not present

## 2016-12-11 DIAGNOSIS — N2581 Secondary hyperparathyroidism of renal origin: Secondary | ICD-10-CM | POA: Diagnosis not present

## 2016-12-11 DIAGNOSIS — E119 Type 2 diabetes mellitus without complications: Secondary | ICD-10-CM | POA: Diagnosis not present

## 2016-12-11 DIAGNOSIS — N186 End stage renal disease: Secondary | ICD-10-CM | POA: Diagnosis not present

## 2016-12-12 ENCOUNTER — Other Ambulatory Visit: Payer: Self-pay | Admitting: Internal Medicine

## 2016-12-12 ENCOUNTER — Encounter (HOSPITAL_COMMUNITY)
Admission: RE | Admit: 2016-12-12 | Discharge: 2016-12-12 | Disposition: A | Discharge status: Home / Self Care | Payer: Medicare Other | Source: Ambulatory Visit | Attending: Internal Medicine | Admitting: Internal Medicine

## 2016-12-12 ENCOUNTER — Encounter: Payer: Self-pay | Admitting: Endocrinology

## 2016-12-12 ENCOUNTER — Ambulatory Visit (INDEPENDENT_AMBULATORY_CARE_PROVIDER_SITE_OTHER): Payer: Medicare Other | Admitting: Endocrinology

## 2016-12-12 VITALS — BP 122/64 | HR 101 | Ht 73.0 in | Wt 235.0 lb

## 2016-12-12 DIAGNOSIS — E0520 Thyrotoxicosis with toxic multinodular goiter without thyrotoxic crisis or storm: Secondary | ICD-10-CM

## 2016-12-12 DIAGNOSIS — I2510 Atherosclerotic heart disease of native coronary artery without angina pectoris: Secondary | ICD-10-CM

## 2016-12-12 DIAGNOSIS — I214 Non-ST elevation (NSTEMI) myocardial infarction: Secondary | ICD-10-CM

## 2016-12-12 DIAGNOSIS — Z955 Presence of coronary angioplasty implant and graft: Secondary | ICD-10-CM | POA: Diagnosis not present

## 2016-12-12 DIAGNOSIS — Z48812 Encounter for surgical aftercare following surgery on the circulatory system: Secondary | ICD-10-CM | POA: Diagnosis not present

## 2016-12-12 LAB — TSH: TSH: 1.92 u[IU]/mL (ref 0.35–4.50)

## 2016-12-12 LAB — T4, FREE: FREE T4: 0.79 ng/dL (ref 0.60–1.60)

## 2016-12-12 NOTE — Progress Notes (Signed)
Subjective:    Patient ID: [name], male    DOB: [birth year], 66 y.o.   MRN: [id number]  HPI Pt has diabetes mellitus:  DM type: 2 Dx'ed: 3557 Complications: polyneuropathy, renal failure, and CAD.  Therapy: welchol DKA: never.  Severe hypoglycemia: never.  Pancreatitis: never.  Other: med needs have decreased with worsening of renal function; fructosamine converts to a1c approx 0.5% lower than a1c itself.   Interval history:  no cbg record, but states cbg's vary from 90-120.  There is no trend throughout the day.  He denies hypoglycemia.   Pt also has Hyperthyroidism (due to a multinodular goiter; dx'ed 2009; bx then was benign; nuc med scan showed very low uptake, so tapazole was chosen as rx).  He takes tapazole as rx'ed. Past Medical History:  Diagnosis Date  . Allergic rhinitis, cause unspecified 02/24/2014  . Anemia 06/16/2011  . BENIGN PROSTATIC HYPERTROPHY 10/14/2009  . CAD, NATIVE VESSEL 02/06/2009   saw Dr. Missy Sabins last jan 2013  . Cervical radiculopathy, chronic 02/23/2016   Right c5-6 by NCS/EMG  . CHEST PAIN 03/29/2010  . COLONIC POLYPS, HX OF 10/14/2009  . CONGESTIVE HEART FAILURE 06/18/2007  . Dementia [id number]  . DEPRESSION 10/14/2009  . Depression 09/24/2015  . DIABETES MELLITUS, TYPE II 02/01/2010  . DIASTOLIC HEART FAILURE, CHRONIC 02/06/2009  . DIZZINESS 07/17/2010  . DYSLIPIDEMIA 06/18/2007  . DYSPNEA 10/29/2008  . ESRD (end stage renal disease) on dialysis ([place]) 08/04/2010   "TTS;  " (04/18/2015)  . FOOT PAIN 08/12/2008  . GAIT DISTURBANCE 03/03/2010  . GASTROENTERITIS, VIRAL 10/14/2009  . GERD 06/18/2007  . GOITER, MULTINODULAR 12/26/2007  . GOUT 06/18/2007  . GYNECOMASTIA 07/17/2010  . Hemodialysis access, fistula mature [facility])    Dialysis [facility]Sa ([place]) Right upper arm fistula  . Hyperlipidemia 10/16/2011  . Hyperparathyroidism, secondary ([place]) 06/16/2011  . HYPERTENSION 06/18/2007  . Hyperthyroidism   . HYPERTHYROIDISM 02/02/2010  .  Hypocalcemia 06/07/2010  . Ischemic cardiomyopathy 06/16/2011  . NECK PAIN 07/31/2010  . NSTEMI (non-ST elevated myocardial infarction) ([place]) 08/04/2016  . ONYCHOMYCOSIS, TOENAILS 12/26/2007  . OSA on CPAP 10/16/2011  . Other malaise and fatigue 11/24/2009  . PERIPHERAL NEUROPATHY 06/18/2007  . Prostate cancer ([place])   . PULMONARY NODULE, RIGHT LOWER LOBE 06/08/2009  . Renal insufficiency   . Sleep apnea    cpap machine and o2  . TRANSAMINASES, SERUM, ELEVATED 02/01/2010  . Transfusion history    none recent  . Unspecified hypotension 01/30/2010    Past Surgical History:  Procedure Laterality Date  . ARTERIOVENOUS GRAFT PLACEMENT Right 2009   forearm/notes 02/01/2011  . AV FISTULA PLACEMENT  11/07/2011   Procedure: INSERTION OF ARTERIOVENOUS (AV) GORE-TEX GRAFT ARM;  Surgeon: Tinnie Gens, MD;  Location: [place];  Service: Vascular;  Laterality: Left;  . BACK SURGERY  1998  . BASCILIC VEIN TRANSPOSITION Right 02/27/2013   Procedure: BASCILIC VEIN TRANSPOSITION;  Surgeon: Mal Misty, MD;  Location: [place];  Service: Vascular;  Laterality: Right;  Right Basilic Vein Transposition   . CARDIAC CATHETERIZATION N/A 08/06/2016   Procedure: Left Heart Cath and Coronary Angiography;  Surgeon: Jolaine Artist, MD;  Location: [place] CV LAB;  Service: Cardiovascular;  Laterality: N/A;  . CARDIAC CATHETERIZATION N/A 08/07/2016   Procedure: Coronary/Graft Atherectomy-CSI LAD;  Surgeon: Peter M Martinique, MD;  Location: [place] CV LAB;  Service: Cardiovascular;  Laterality: N/A;  . CERVICAL SPINE SURGERY  2/09   "to repair nerve problems in my  left arm"  . CHOLECYSTECTOMY    . CORONARY ANGIOPLASTY WITH STENT PLACEMENT  06/11/2008  . CORONARY ANGIOPLASTY WITH STENT PLACEMENT  06/2007   TAXUS stent to RCA/notes 01/31/2011  . ESOPHAGOGASTRODUODENOSCOPY  09/28/2011   Procedure: ESOPHAGOGASTRODUODENOSCOPY (EGD);  Surgeon: Missy Sabins, MD;  Location[facility] ENDOSCOPY;  Service: Endoscopy;  Laterality: N/A;  .  ESOPHAGOGASTRODUODENOSCOPY N/A 04/07/2015   Procedure: ESOPHAGOGASTRODUODENOSCOPY (EGD);  Surgeon: Teena Irani, MD;  Location: [facility] ENDOSCOPY;  Service: Endoscopy;  Laterality: N/A;  . ESOPHAGOGASTRODUODENOSCOPY N/A 04/19/2015   Procedure: ESOPHAGOGASTRODUODENOSCOPY (EGD);  Surgeon: Arta Silence, MD;  Location: [facility] ENDOSCOPY;  Service: Endoscopy;  Laterality: N/A;  . FOREIGN BODY REMOVAL  09/2003   via upper endoscopy/notes 02/12/2011  . GIVENS CAPSULE STUDY  09/30/2011   Procedure: GIVENS CAPSULE STUDY;  Surgeon: Jeryl Columbia, MD;  Location: [facility] ENDOSCOPY;  Service: Endoscopy;  Laterality: N/A;  . INSERTION OF DIALYSIS CATHETER Right 2014  . INSERTION OF DIALYSIS CATHETER Left 02/11/2013   Procedure: INSERTION OF DIALYSIS CATHETER;  Surgeon: Conrad [employer], MD;  Location: [place];  Service: Vascular;  Laterality: Left;  Ultrasound guided  . REMOVAL OF A DIALYSIS CATHETER Right 02/11/2013   Procedure: REMOVAL OF A DIALYSIS CATHETER;  Surgeon: Conrad [place], MD;  Location: [place];  Service: Vascular;  Laterality: Right;  . SAVORY DILATION N/A 04/07/2015   Procedure: SAVORY DILATION;  Surgeon: Teena Irani, MD;  Location: [facility] ENDOSCOPY;  Service: Endoscopy;  Laterality: N/A;  . SHUNTOGRAM N/A 09/20/2011   Procedure: Earney Mallet;  Surgeon: Conrad Be[place], MD;  Location: [facility] CATH LAB;  Service: Cardiovascular;  Laterality: N/A;  . SVT ABLATION N/A 11/26/2016   Procedure: SVT Ablation;  Surgeon: Will Meredith Leeds, MD;  Location: [place] CV LAB;  Service: Cardiovascular;  Laterality: N/A;  . TONSILLECTOMY    . TOTAL KNEE ARTHROPLASTY Right 08/02/2015   Procedure: TOTAL KNEE ARTHROPLASTY;  Surgeon: Renette Butters, MD;  Location: [place];  Service: Orthopedics;  Laterality: Right;  . VENOGRAM N/A 01/26/2013   Procedure: VENOGRAM;  Surgeon: Angelia Mould, MD;  Location: [facility] CATH LAB;  Service: Cardiovascular;  Laterality: N/A;    Social History   Social History  . Marital status: Married    Spouse name: N/A    . Number of children: 3  . Years of education: N/A   Occupational History  . 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    Social History Main Topics  . Smoking status: Former Smoker    Packs/day: 1.00    Years: 25.00    Types: Cigarettes    Quit date: 10/01/2005  . Smokeless tobacco: Former Systems developer    Quit date: 10/01/2005     Comment: Quit smoking 2007 Smoked x 25 years 1/2 ppd.  . Alcohol use No  . Drug use: No  . Sexual activity: No   Other Topics Concern  . Not on file   Social History Narrative  . No narrative on file    Current Outpatient Prescriptions on File Prior to Visit  Medication Sig Dispense Refill  . acetaminophen (TYLENOL) 325 MG tablet Take 650 mg by mouth every 6 (six) hours as needed for mild pain or headache.     . allopurinol (ZYLOPRIM) 100 MG tablet Take 1 tablet (100 mg total) by mouth daily. 90 tablet 3  . aspirin 81 MG tablet Take 81 mg by mouth daily.    . carvedilol (COREG) 3.125 MG tablet Take 1 tablet (3.125 mg total)  by mouth 2 (two) times daily. ON NON-HD DAYS 60 tablet 3  . citalopram (CELEXA) 10 MG tablet Take 10 mg by mouth 3 (three) times a week.    . clopidogrel (PLAVIX) 75 MG tablet Take 1 tablet (75 mg total) by mouth daily with breakfast. 30 tablet 6  . colesevelam (WELCHOL) 625 MG tablet TAKE THREE TABLETS BY MOUTH TWICE DAILY WITH MEALS 180 tablet 11  . ezetimibe (ZETIA) 10 MG tablet Take 1 tablet (10 mg total) by mouth daily. 90 tablet 3  . gabapentin (NEURONTIN) 300 MG capsule 1-2 tabs by mouth at bedtime for sleep and pain 180 capsule 3  . Lanthanum Carbonate 1000 MG PACK Take 2 packets by mouth 2 (two) times daily.    . methimazole (TAPAZOLE) 5 MG tablet Take 1 tablet (5 mg total) by mouth daily. 30 tablet 11  . midodrine (PROAMATINE) 10 MG tablet Take 1 tablet (10 mg total) by mouth See admin instructions. Once a day only on dialysis days (Tues/Thurs/Sat) 30 tablet 0  . multivitamin (RENA-VIT) TABS tablet Take 1  tablet by mouth at bedtime. Reported on 10/24/2015    . pantoprazole (PROTONIX) 40 MG tablet Take 40 mg by mouth 2 (two) times daily.     . SENSIPAR 90 MG tablet Take 90 mg by mouth 2 (two) times daily.     No current facility-administered medications on file prior to visit.     Allergies  Allergen Reactions  . Cephalexin Swelling and Other (See Comments)    Tongue swelling  . Statins Other (See Comments)    Weak muscles  . Ciprofloxacin Rash    Family History  Problem Relation Age of Onset  . Heart disease Sister   . Thyroid nodules Sister   . Heart disease Father   . Diabetes Father   . Kidney failure Father   . Hypertension Father   . Healthy Child   . Healthy Child   . Healthy Child   . Cancer Neg Hx     BP 122/64   Pulse (!) 101   Ht 6\' 1"  (1.854 m)   Wt 235 lb (106.6 kg)   SpO2 99%   BMI 31.00 kg/m    Review of Systems Denies fever.      Objective:   Physical Exam VITAL SIGNS:  See vs page GENERAL: no distress NECK: There is no palpable thyroid enlargement.  No thyroid nodule is palpable.  No palpable lymphadenopathy at the anterior neck.  NEURO: no tremor SKIN: not diaphoretic.    Lab Results  Component Value Date   TSH 1.92 12/12/2016   T4TOTAL 5.6 07/19/2008      Assessment & Plan:  Hyperthyroidism: well-controlled.  Please continue the same medication

## 2016-12-12 NOTE — Progress Notes (Signed)
[name] returned to exercise at Cardiac rehab and exercised without complaints.Will continue to monitor the patient throughout  the program.

## 2016-12-12 NOTE — Patient Instructions (Addendum)
blood tests are requested for you today.  We'll let you know about the results. If ever you have fever while taking methimazole, stop it and call us, even if the reason is obvious, because of the risk of a rare side-effect. Please come back for a follow-up appointment in 6 weeks.

## 2016-12-13 DIAGNOSIS — D631 Anemia in chronic kidney disease: Secondary | ICD-10-CM | POA: Diagnosis not present

## 2016-12-13 DIAGNOSIS — N2581 Secondary hyperparathyroidism of renal origin: Secondary | ICD-10-CM | POA: Diagnosis not present

## 2016-12-13 DIAGNOSIS — E119 Type 2 diabetes mellitus without complications: Secondary | ICD-10-CM | POA: Diagnosis not present

## 2016-12-13 DIAGNOSIS — N186 End stage renal disease: Secondary | ICD-10-CM | POA: Diagnosis not present

## 2016-12-14 ENCOUNTER — Encounter (HOSPITAL_COMMUNITY)
Admission: RE | Admit: 2016-12-14 | Discharge: 2016-12-14 | Disposition: A | Discharge status: Home / Self Care | Payer: Medicare Other | Source: Ambulatory Visit | Attending: Internal Medicine | Admitting: Internal Medicine

## 2016-12-14 DIAGNOSIS — I214 Non-ST elevation (NSTEMI) myocardial infarction: Secondary | ICD-10-CM | POA: Diagnosis not present

## 2016-12-14 DIAGNOSIS — Z48812 Encounter for surgical aftercare following surgery on the circulatory system: Secondary | ICD-10-CM | POA: Diagnosis not present

## 2016-12-14 DIAGNOSIS — Z955 Presence of coronary angioplasty implant and graft: Secondary | ICD-10-CM

## 2016-12-14 NOTE — Progress Notes (Signed)
Reviewed home exercise with pt today.  Pt plans to do hip strengthening/balance for exercise. In order to maintain or improve functional capacity and improve balance. Reviewed THR, pulse, RPE, sign and symptoms, and when to call 911 or MD.  Also discussed weather considerations and indoor options.  Pt voiced understanding.    [employer] ACSM RCEP

## 2016-12-15 DIAGNOSIS — D631 Anemia in chronic kidney disease: Secondary | ICD-10-CM | POA: Diagnosis not present

## 2016-12-15 DIAGNOSIS — N186 End stage renal disease: Secondary | ICD-10-CM | POA: Diagnosis not present

## 2016-12-15 DIAGNOSIS — N2581 Secondary hyperparathyroidism of renal origin: Secondary | ICD-10-CM | POA: Diagnosis not present

## 2016-12-15 DIAGNOSIS — E119 Type 2 diabetes mellitus without complications: Secondary | ICD-10-CM | POA: Diagnosis not present

## 2016-12-17 ENCOUNTER — Encounter (HOSPITAL_COMMUNITY)
Admission: RE | Admit: 2016-12-17 | Discharge: 2016-12-17 | Disposition: A | Discharge status: Home / Self Care | Payer: Medicare Other | Source: Ambulatory Visit | Attending: Internal Medicine | Admitting: Internal Medicine

## 2016-12-17 DIAGNOSIS — Z955 Presence of coronary angioplasty implant and graft: Secondary | ICD-10-CM | POA: Diagnosis not present

## 2016-12-17 DIAGNOSIS — I214 Non-ST elevation (NSTEMI) myocardial infarction: Secondary | ICD-10-CM | POA: Diagnosis not present

## 2016-12-17 DIAGNOSIS — Z48812 Encounter for surgical aftercare following surgery on the circulatory system: Secondary | ICD-10-CM | POA: Diagnosis not present

## 2016-12-18 DIAGNOSIS — D631 Anemia in chronic kidney disease: Secondary | ICD-10-CM | POA: Diagnosis not present

## 2016-12-18 DIAGNOSIS — N186 End stage renal disease: Secondary | ICD-10-CM | POA: Diagnosis not present

## 2016-12-18 DIAGNOSIS — N2581 Secondary hyperparathyroidism of renal origin: Secondary | ICD-10-CM | POA: Diagnosis not present

## 2016-12-18 DIAGNOSIS — E119 Type 2 diabetes mellitus without complications: Secondary | ICD-10-CM | POA: Diagnosis not present

## 2016-12-19 ENCOUNTER — Encounter (HOSPITAL_COMMUNITY)
Admission: RE | Admit: 2016-12-19 | Discharge: 2016-12-19 | Disposition: A | Discharge status: Home / Self Care | Payer: Medicare Other | Source: Ambulatory Visit | Attending: Internal Medicine | Admitting: Internal Medicine

## 2016-12-19 DIAGNOSIS — I214 Non-ST elevation (NSTEMI) myocardial infarction: Secondary | ICD-10-CM | POA: Diagnosis not present

## 2016-12-19 DIAGNOSIS — Z48812 Encounter for surgical aftercare following surgery on the circulatory system: Secondary | ICD-10-CM | POA: Diagnosis not present

## 2016-12-19 DIAGNOSIS — Z955 Presence of coronary angioplasty implant and graft: Secondary | ICD-10-CM

## 2016-12-20 DIAGNOSIS — E119 Type 2 diabetes mellitus without complications: Secondary | ICD-10-CM | POA: Diagnosis not present

## 2016-12-20 DIAGNOSIS — N186 End stage renal disease: Secondary | ICD-10-CM | POA: Diagnosis not present

## 2016-12-20 DIAGNOSIS — D631 Anemia in chronic kidney disease: Secondary | ICD-10-CM | POA: Diagnosis not present

## 2016-12-20 DIAGNOSIS — N2581 Secondary hyperparathyroidism of renal origin: Secondary | ICD-10-CM | POA: Diagnosis not present

## 2016-12-21 ENCOUNTER — Encounter (HOSPITAL_COMMUNITY): Admission: RE | Admit: 2016-12-21 | Payer: Medicare Other | Source: Ambulatory Visit

## 2016-12-21 ENCOUNTER — Telehealth (HOSPITAL_COMMUNITY): Payer: Self-pay | Admitting: Internal Medicine

## 2016-12-21 NOTE — Telephone Encounter (Signed)
Pt's wife called states patient had a rough night will cancel today's visit. Also made calendar for pt to p/u on 03/26 visit added up to 18 wks due to pt has missed visits and pt's spouse wanted to go ahead and add the days Medicare will pay for. .... KJ

## 2016-12-22 DIAGNOSIS — N2581 Secondary hyperparathyroidism of renal origin: Secondary | ICD-10-CM | POA: Diagnosis not present

## 2016-12-22 DIAGNOSIS — D631 Anemia in chronic kidney disease: Secondary | ICD-10-CM | POA: Diagnosis not present

## 2016-12-22 DIAGNOSIS — N186 End stage renal disease: Secondary | ICD-10-CM | POA: Diagnosis not present

## 2016-12-22 DIAGNOSIS — E119 Type 2 diabetes mellitus without complications: Secondary | ICD-10-CM | POA: Diagnosis not present

## 2016-12-24 ENCOUNTER — Encounter (HOSPITAL_COMMUNITY)
Admission: RE | Admit: 2016-12-24 | Discharge: 2016-12-24 | Disposition: A | Discharge status: Home / Self Care | Payer: Medicare Other | Source: Ambulatory Visit | Attending: Internal Medicine | Admitting: Internal Medicine

## 2016-12-24 DIAGNOSIS — D638 Anemia in other chronic diseases classified elsewhere: Secondary | ICD-10-CM | POA: Diagnosis not present

## 2016-12-24 DIAGNOSIS — N400 Enlarged prostate without lower urinary tract symptoms: Secondary | ICD-10-CM | POA: Diagnosis not present

## 2016-12-24 DIAGNOSIS — I5042 Chronic combined systolic (congestive) and diastolic (congestive) heart failure: Secondary | ICD-10-CM | POA: Diagnosis not present

## 2016-12-24 DIAGNOSIS — J449 Chronic obstructive pulmonary disease, unspecified: Secondary | ICD-10-CM | POA: Diagnosis not present

## 2016-12-24 DIAGNOSIS — I2510 Atherosclerotic heart disease of native coronary artery without angina pectoris: Secondary | ICD-10-CM | POA: Diagnosis not present

## 2016-12-24 DIAGNOSIS — G4733 Obstructive sleep apnea (adult) (pediatric): Secondary | ICD-10-CM | POA: Diagnosis not present

## 2016-12-24 DIAGNOSIS — Z955 Presence of coronary angioplasty implant and graft: Secondary | ICD-10-CM

## 2016-12-24 DIAGNOSIS — I132 Hypertensive heart and chronic kidney disease with heart failure and with stage 5 chronic kidney disease, or end stage renal disease: Secondary | ICD-10-CM | POA: Diagnosis not present

## 2016-12-24 DIAGNOSIS — M5412 Radiculopathy, cervical region: Secondary | ICD-10-CM | POA: Diagnosis not present

## 2016-12-24 DIAGNOSIS — I214 Non-ST elevation (NSTEMI) myocardial infarction: Secondary | ICD-10-CM

## 2016-12-24 DIAGNOSIS — E8779 Other fluid overload: Secondary | ICD-10-CM | POA: Diagnosis not present

## 2016-12-24 DIAGNOSIS — N186 End stage renal disease: Secondary | ICD-10-CM | POA: Diagnosis not present

## 2016-12-24 DIAGNOSIS — J309 Allergic rhinitis, unspecified: Secondary | ICD-10-CM | POA: Diagnosis not present

## 2016-12-24 DIAGNOSIS — N2581 Secondary hyperparathyroidism of renal origin: Secondary | ICD-10-CM | POA: Diagnosis not present

## 2016-12-24 NOTE — Progress Notes (Signed)
Pt arrived at cardiac rehab c/o tachycardia. Pt reports dizziness and fatigue this am.  Pt also reports dyspnea at rest this weekend.  Pt denies these symptoms at this time.   Telemetry-sinus tachycardia, HR-130 at rest. BP:  137/66.  12 lead EKG obtained:  Sinus tach.   Meaghan in Heart Failure clinic made aware.  No new orders received.  12 lead, rhythm strip and rehab report faxed to Dr. [name] and Dr. Curt Bears for review.  Pt has scheduled appt with Dr. Curt Bears 12/26/2016.  Pt instructed to keep appt as scheduled.  Call 911 for sudden unrelieved symptoms, especially symptoms associated with tachycardia.   Pt wife return demonstrated pulse counting.  Understanding verbalized.

## 2016-12-25 ENCOUNTER — Telehealth (HOSPITAL_COMMUNITY): Payer: Self-pay | Admitting: *Deleted

## 2016-12-25 DIAGNOSIS — E119 Type 2 diabetes mellitus without complications: Secondary | ICD-10-CM | POA: Diagnosis not present

## 2016-12-25 DIAGNOSIS — D631 Anemia in chronic kidney disease: Secondary | ICD-10-CM | POA: Diagnosis not present

## 2016-12-25 DIAGNOSIS — N186 End stage renal disease: Secondary | ICD-10-CM | POA: Diagnosis not present

## 2016-12-25 DIAGNOSIS — N2581 Secondary hyperparathyroidism of renal origin: Secondary | ICD-10-CM | POA: Diagnosis not present

## 2016-12-25 NOTE — Progress Notes (Signed)
Electrophysiology Office Note   Date:  12/26/2016   ID:  [name], DOB 1951-08-07, MRN [id number]  PCP:  [name], MD  Cardiologist:  [place] Primary Electrophysiologist:  Shubh Chiara Meredith Leeds, MD    Chief Complaint  Patient presents with  . Follow-up    Post SVT ablation  . Fatigue     History of Present Illness: [name] is a 66 y.o. male who presents today for electrophysiology evaluation.   He has a history of obesity, diabetes, COPD on nighttime oxygen, sleep apnea on CPAP, ESRD- HD, CAD, S/P Taxus drug-eluting stent to the right coronary in September 0347, chronic systolic/diastolic heart failure (Unable to tolerate statins so placed on Welchol), prostate CA s/p XRT. On 11/17, he underwent an STEMI and underwent PCI to the LAD. The RCA had a long 80-90% lesion but was felt not amenable to PCI due to tortuosity. He was admitted on 1/18 with SVT and an an STEMI. He had another episode of SVT on 11/15/16 and presented to the emergency room. This was complicated by hypotension, lightheadedness and blurry vision. He was undergoing dialysis at the time when his heart rate went up to the 160s. He was given 6 mg of adenosine which converted him to sinus rhythm. His symptoms then resolved. 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    Today, he denies symptoms of palpitations, chest pain,  lower extremity edema, claudication, dizziness, presyncope, syncope, bleeding, or neurologic sequela. The patient is tolerating medications without difficulties.   Last Thursday, he says that he woke up from sleep very short of breath. He did not have associated chest pain. His shortness of breath was intermittent over the course of a few hours. He did not do any activity, and nothing made it better. His wife urged him to go to the emergency room, but he refused. The shortness of breath when away. He is continuing to feel fatigued, mainly due to dialysis. He says that he did have a day that his  heart rate went up to 130 bpm, but there is no documentation. Past Medical History:  Diagnosis Date  . Allergic rhinitis, cause unspecified 02/24/2014  . Anemia 06/16/2011  . BENIGN PROSTATIC HYPERTROPHY 10/14/2009  . CAD, NATIVE VESSEL 02/06/2009   saw Dr. Missy Sabins last jan 2013  . Cervical radiculopathy, chronic 02/23/2016   Right c5-6 by NCS/EMG  . CHEST PAIN 03/29/2010  . COLONIC POLYPS, HX OF 10/14/2009  . CONGESTIVE HEART FAILURE 06/18/2007  . Dementia [id number]  . DEPRESSION 10/14/2009  . Depression 09/24/2015  . DIABETES MELLITUS, TYPE II 02/01/2010  . DIASTOLIC HEART FAILURE, CHRONIC 02/06/2009  . DIZZINESS 07/17/2010  . DYSLIPIDEMIA 06/18/2007  . DYSPNEA 10/29/2008  . ESRD (end stage renal disease) on dialysis ([place]) 08/04/2010   "TTS;  " (04/18/2015)  . FOOT PAIN 08/12/2008  . GAIT DISTURBANCE 03/03/2010  . GASTROENTERITIS, VIRAL 10/14/2009  . GERD 06/18/2007  . GOITER, MULTINODULAR 12/26/2007  . GOUT 06/18/2007  . GYNECOMASTIA 07/17/2010  . Hemodialysis access, fistula mature [facility])    Dialysis T-Th-Sa ([place]) Right upper arm fistula  . Hyperlipidemia 10/16/2011  . Hyperparathyroidism, secondary ([place]) 06/16/2011  . HYPERTENSION 06/18/2007  . Hyperthyroidism   . HYPERTHYROIDISM 02/02/2010  . Hypocalcemia 06/07/2010  . Ischemic cardiomyopathy 06/16/2011  . NECK PAIN 07/31/2010  . NSTEMI (non-ST elevated myocardial infarction) ([place]) 08/04/2016  . ONYCHOMYCOSIS, TOENAILS 12/26/2007  . OSA on CPAP 10/16/2011  . Other malaise and fatigue 11/24/2009  . PERIPHERAL  NEUROPATHY 06/18/2007  . Prostate cancer ([place])   . PULMONARY NODULE, RIGHT LOWER LOBE 06/08/2009  . Renal insufficiency   . Sleep apnea    cpap machine and o2  . TRANSAMINASES, SERUM, ELEVATED 02/01/2010  . Transfusion history    none recent  . Unspecified hypotension 01/30/2010   Past Surgical History:  Procedure Laterality Date  . ARTERIOVENOUS GRAFT PLACEMENT Right 2009   forearm/notes 02/01/2011  . AV FISTULA  PLACEMENT  11/07/2011   Procedure: INSERTION OF ARTERIOVENOUS (AV) GORE-TEX GRAFT ARM;  Surgeon: Tinnie Gens, MD;  Location: [place];  Service: Vascular;  Laterality: Left;  . BACK SURGERY  1998  . BASCILIC VEIN TRANSPOSITION Right 02/27/2013   Procedure: BASCILIC VEIN TRANSPOSITION;  Surgeon: Mal Misty, MD;  Location: [place];  Service: Vascular;  Laterality: Right;  Right Basilic Vein Transposition   . CARDIAC CATHETERIZATION N/A 08/06/2016   Procedure: Left Heart Cath and Coronary Angiography;  Surgeon: Jolaine Artist, MD;  Location: [place] CV LAB;  Service: Cardiovascular;  Laterality: N/A;  . CARDIAC CATHETERIZATION N/A 08/07/2016   Procedure: Coronary/Graft Atherectomy-CSI LAD;  Surgeon: Peter M Martinique, MD;  Location: [place] CV LAB;  Service: Cardiovascular;  Laterality: N/A;  . CERVICAL SPINE SURGERY  2/09   "to repair nerve problems in my left arm"  . CHOLECYSTECTOMY    . CORONARY ANGIOPLASTY WITH STENT PLACEMENT  06/11/2008  . CORONARY ANGIOPLASTY WITH STENT PLACEMENT  06/2007   TAXUS stent to RCA/notes 01/31/2011  . ESOPHAGOGASTRODUODENOSCOPY  09/28/2011   Procedure: ESOPHAGOGASTRODUODENOSCOPY (EGD);  Surgeon: Missy Sabins, MD;  Location: [facility] ENDOSCOPY;  Service: Endoscopy;  Laterality: N/A;  . ESOPHAGOGASTRODUODENOSCOPY N/A 04/07/2015   Procedure: ESOPHAGOGASTRODUODENOSCOPY (EGD);  Surgeon: Teena Irani, MD;  Location: [facility];  Service: Endoscopy;  Laterality: N/A;  . ESOPHAGOGASTRODUODENOSCOPY N/A 04/19/2015   Procedure: ESOPHAGOGASTRODUODENOSCOPY (EGD);  Surgeon: Arta Silence, MD;  Location: [facility] ENDOSCOPY;  Service: Endoscopy;  Laterality: N/A;  . FOREIGN BODY REMOVAL  09/2003   via upper endoscopy/notes 02/12/2011  . GIVENS CAPSULE STUDY  09/30/2011   Procedure: GIVENS CAPSULE STUDY;  Surgeon: Jeryl Columbia, MD;  Location: [facility] ENDOSCOPY;  Service: Endoscopy;  Laterality: N/A;  . INSERTION OF DIALYSIS CATHETER Right 2014  . INSERTION OF DIALYSIS CATHETER Left 02/11/2013    Procedure: INSERTION OF DIALYSIS CATHETER;  Surgeon: Conrad Emmons, MD;  Location: [place];  Service: Vascular;  Laterality: Left;  Ultrasound guided  . REMOVAL OF A DIALYSIS CATHETER Right 02/11/2013   Procedure: REMOVAL OF A DIALYSIS CATHETER;  Surgeon: Conrad Martin, MD;  Location: [place];  Service: Vascular;  Laterality: Right;  . SAVORY DILATION N/A 04/07/2015   Procedure: SAVORY DILATION;  Surgeon: Teena Irani, MD;  Location: [facility] ENDOSCOPY;  Service: Endoscopy;  Laterality: N/A;  . SHUNTOGRAM N/A 09/20/2011   Procedure: Earney Mallet;  Surgeon: [place], MD;  Location: [facility] CATH LAB;  Service: Cardiovascular;  Laterality: N/A;  . SVT ABLATION N/A 11/26/2016   Procedure: SVT Ablation;  Surgeon: Saulo Anthis Meredith Leeds, MD;  Location: [place] CV LAB;  Service: Cardiovascular;  Laterality: N/A;  . TONSILLECTOMY    . TOTAL KNEE ARTHROPLASTY Right 08/02/2015   Procedure: TOTAL KNEE ARTHROPLASTY;  Surgeon: Renette Butters, MD;  Location: [place];  Service: Orthopedics;  Laterality: Right;  . VENOGRAM N/A 01/26/2013   Procedure: VENOGRAM;  Surgeon: Angelia Mould, MD;  [facility] C[facility] LAB;  Service: Cardiovascular;  Laterality: N/A;     Current Outpatient Prescriptions  Medication  Sig Dispense Refill  . acetaminophen (TYLENOL) 325 MG tablet Take 650 mg by mouth every 6 (six) hours as needed for mild pain or headache.     . allopurinol (ZYLOPRIM) 100 MG tablet Take 1 tablet (100 mg total) by mouth daily. 90 tablet 3  . aspirin 81 MG tablet Take 81 mg by mouth daily.    . carvedilol (COREG) 3.125 MG tablet Take 1 tablet (3.125 mg total) by mouth 2 (two) times daily. ON NON-HD DAYS 60 tablet 3  . citalopram (CELEXA) 10 MG tablet Take 10 mg by mouth 3 (three) times a week.    . clopidogrel (PLAVIX) 75 MG tablet Take 1 tablet (75 mg total) by mouth daily with breakfast. 30 tablet 6  . colesevelam (WELCHOL) 625 MG tablet TAKE THREE TABLETS BY MOUTH TWICE DAILY WITH MEALS 180 tablet 11  . ezetimibe  (ZETIA) 10 MG tablet Take 1 tablet (10 mg total) by mouth daily. 90 tablet 3  . gabapentin (NEURONTIN) 300 MG capsule 1-2 tabs by mouth at bedtime for sleep and pain 180 capsule 3  . gabapentin (NEURONTIN) 300 MG capsule TAKE ONE TO TWO CAPSULES BY MOUTH AT BEDTIME FOR SLEEP AND FOR PAIN 180 capsule 2  . HYDROcodone-acetaminophen (NORCO/VICODIN) 5-325 MG tablet Take 1 tablet by mouth every 6 (six) hours as needed for moderate pain.    . Lanthanum Carbonate 1000 MG PACK Take 2 packets by mouth 2 (two) times daily.    . methimazole (TAPAZOLE) 5 MG tablet Take 1 tablet (5 mg total) by mouth daily. 30 tablet 11  . midodrine (PROAMATINE) 10 MG tablet Take 1 tablet (10 mg total) by mouth See admin instructions. Once a day only on dialysis days (Tues/Thurs/Sat) 30 tablet 0  . multivitamin (RENA-VIT) TABS tablet Take 1 tablet by mouth at bedtime. Reported on 10/24/2015    . pantoprazole (PROTONIX) 40 MG tablet Take 40 mg by mouth 2 (two) times daily.     . SENSIPAR 90 MG tablet Take 90 mg by mouth 2 (two) times daily.     No current facility-administered medications for this visit.     Allergies:   Cephalexin; Statins; and Ciprofloxacin   Social History:  The patient  reports that he quit smoking about 11 years ago. His smoking use included Cigarettes. He has a 25.00 pack-year smoking history. He quit smokeless tobacco use about 11 years ago. He reports that he does not drink alcohol or use drugs.   Family History:  The patient's family history includes Diabetes in his father; Healthy in his child, child, and child; Heart disease in his father and sister; Hypertension in his father; Kidney failure in his father; Thyroid nodules in his sister.    ROS:  Please see the history of present illness.   Otherwise, review of systems is positive for SOB lying down, DOE.   All other systems are reviewed and negative.    PHYSICAL EXAM: VS:  BP 112/70   Pulse 99   Ht 6\' 1"  (1.854 m)   Wt 235 lb 3.2 oz (106.7  kg)   BMI 31.03 kg/m  , BMI Body mass index is 31.03 kg/m. GEN: Well nourished, well developed, in no acute distress  HEENT: normal  Neck: no JVD, carotid bruits, or masses Cardiac: RRR; no murmurs, rubs, or gallops,no edema  Respiratory:  clear to auscultation bilaterally, normal work of breathing GI: soft, nontender, nondistended, + BS MS: no deformity or atrophy  Skin: warm and dry Neuro:  Strength  and sensation are intact Psych: euthymic mood, full affect  EKG:  EKG is ordered today. Personal review of the ekg ordered shows sinus rhythm, rate 99, prolonged QTc  Recent Labs: 10/16/2016: ALT 26; Magnesium 2.0 11/15/2016: Hemoglobin 11.1 11/20/2016: Platelets 304 11/26/2016: BUN 41; Creatinine, Ser 11.68; Potassium 4.6; Sodium 143 12/12/2016: TSH 1.92    Lipid Panel     Component Value Date/Time   CHOL 268 (H) 10/18/2016 0232   TRIG 262 (H) 10/18/2016 0232   HDL 46 10/18/2016 0232   CHOLHDL 5.8 10/18/2016 0232   VLDL 52 (H) 10/18/2016 0232   LDLCALC 170 (H) 10/18/2016 0232   LDLDIRECT 136.0 07/16/2014 1503     Wt Readings from Last 3 Encounters:  12/26/16 235 lb 3.2 oz (106.7 kg)  12/12/16 235 lb (106.6 kg)  11/26/16 235 lb (106.6 kg)      Other studies Reviewed: Additional studies/ records that were reviewed today include: TTE 10/17/16, Cath 08/07/16  Review of the above records today demonstrates:  - Left ventricle: The cavity size was normal. Systolic function was   mildly to moderately reduced. The estimated ejection fraction was   in the range of 40% to 45%. Wall motion was normal; there were no   regional wall motion abnormalities. Features are consistent with   a pseudonormal left ventricular filling pattern, with concomitant   abnormal relaxation and increased filling pressure (grade 2   diastolic dysfunction). Doppler parameters are consistent with   elevated ventricular end-diastolic filling pressure. - Aortic valve: There was no regurgitation. - Aortic  root: The aortic root was normal in size. - Mitral valve: There was trivial regurgitation. - Left atrium: The atrium was mildly dilated. - Right ventricle: Systolic function was normal. - Right atrium: The atrium was normal in size. - Pulmonary arteries: Systolic pressure was within the normal   range. - Inferior vena cava: The vessel was normal in size. - Pericardium, extracardiac: There was no pericardial effusion.   Mid LAD lesion, 90 %stenosed.  Ost LM to LM lesion, 30 %stenosed.  Prox Cx to Mid Cx lesion, 40 %stenosed.  1st Mrg lesion, 60 %stenosed.  Ramus lesion, 40 %stenosed.  Prox RCA to Mid RCA lesion, 70 %stenosed.  Mid RCA lesion, 80 %stenosed.  Dist RCA lesion, 0 %stenosed.   Assessment:  1. Heavily calcified coronary arteries 2. High grade lesion in midLAD (90%) and borderline lesion midRCA (70-80%) 3. LVEDP 17   A STENT SYNERGY DES 3X16 drug eluting stent was successfully placed.  Mid LAD lesion, 90 %stenosed.  Post intervention, there is a 0% residual stenosis.   1. Successful orbital atherectomy and stenting of the mid LAD with DES  ASSESSMENT AND PLAN:  1.  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 Has done well without known recurrence. Continue current management.  2. Chronic systolic heart failure: Likely mixed ischemic and nonischemic. Volume status managed by dialysis. Most recent EF 40-45%. Due to the fact his blood pressure drops with dialysis and he is tachycardic, Nicandro Perrault switch his Coreg to metoprolol 25 mg on nondialysis days.  3. Coronary artery disease: Status post PCI of the LAD in 11 2017 with recurrent an STEMI due to SVT 10/2016. Has been unable to tolerate statins due to myositis. Continue dual antiplatelet therapy. He did have an episode of severe shortness of breath 5 days ago. We'll get a troponin today trying to determine if this was due to cardiac ischemia. His EKG today shows no ischemic changes.  4. Hypertension: Periods of low blood  pressure with dialysis. Continue Midrin on dialysis days. Blood pressure is elevated today, but with hypotension during dialysis Naylee Frankowski stop carvedilol and start metoprolol.    Current medicines are reviewed at length with the patient today.   The patient does not have concerns regarding his medicines.  The following changes were made today:  Stop coreg, start metoprolol  Labs/ tests ordered today include:  No orders of the defined types were placed in this encounter.    Disposition:   FU with Tarren Sabree 3 months  Signed, Lazer Wollard Meredith Leeds, MD  12/26/2016 11:30 AM     [facility] [address] [place] [address] [phone] (office) [phone] (fax)

## 2016-12-25 NOTE — Telephone Encounter (Signed)
Received form from [facility] D[facility], Dr [name] completed form:  No antibiotics needed  No adjustments to medications needed  Ok to proceed without contraindications  Form faxed back to them at [phone]

## 2016-12-26 ENCOUNTER — Other Ambulatory Visit: Payer: Self-pay

## 2016-12-26 ENCOUNTER — Encounter (HOSPITAL_COMMUNITY): Payer: Self-pay | Admitting: Emergency Medicine

## 2016-12-26 ENCOUNTER — Telehealth: Payer: Self-pay | Admitting: Physician Assistant

## 2016-12-26 ENCOUNTER — Encounter: Payer: Self-pay | Admitting: Cardiology

## 2016-12-26 ENCOUNTER — Encounter (HOSPITAL_COMMUNITY)
Admission: RE | Admit: 2016-12-26 | Discharge: 2016-12-26 | Disposition: A | Discharge status: Home / Self Care | Payer: Medicare Other | Source: Ambulatory Visit | Attending: Internal Medicine | Admitting: Internal Medicine

## 2016-12-26 ENCOUNTER — Ambulatory Visit (INDEPENDENT_AMBULATORY_CARE_PROVIDER_SITE_OTHER): Payer: Medicare Other | Admitting: Cardiology

## 2016-12-26 ENCOUNTER — Inpatient Hospital Stay (HOSPITAL_COMMUNITY)
Admission: EM | Admit: 2016-12-26 | Discharge: 2016-12-28 | DRG: 640 | Disposition: A | Discharge status: Home / Self Care | Payer: Medicare Other | Attending: Internal Medicine | Admitting: Internal Medicine

## 2016-12-26 ENCOUNTER — Emergency Department (HOSPITAL_COMMUNITY): Payer: Medicare Other

## 2016-12-26 ENCOUNTER — Encounter (HOSPITAL_COMMUNITY): Payer: Self-pay

## 2016-12-26 VITALS — BP 112/70 | HR 99 | Ht 73.0 in | Wt 235.2 lb

## 2016-12-26 DIAGNOSIS — Z79899 Other long term (current) drug therapy: Secondary | ICD-10-CM

## 2016-12-26 DIAGNOSIS — R0602 Shortness of breath: Secondary | ICD-10-CM | POA: Diagnosis not present

## 2016-12-26 DIAGNOSIS — K219 Gastro-esophageal reflux disease without esophagitis: Secondary | ICD-10-CM | POA: Diagnosis present

## 2016-12-26 DIAGNOSIS — Z8249 Family history of ischemic heart disease and other diseases of the circulatory system: Secondary | ICD-10-CM

## 2016-12-26 DIAGNOSIS — Z9981 Dependence on supplemental oxygen: Secondary | ICD-10-CM

## 2016-12-26 DIAGNOSIS — E8779 Other fluid overload: Principal | ICD-10-CM | POA: Diagnosis present

## 2016-12-26 DIAGNOSIS — Z955 Presence of coronary angioplasty implant and graft: Secondary | ICD-10-CM

## 2016-12-26 DIAGNOSIS — N2581 Secondary hyperparathyroidism of renal origin: Secondary | ICD-10-CM | POA: Diagnosis present

## 2016-12-26 DIAGNOSIS — Z96651 Presence of right artificial knee joint: Secondary | ICD-10-CM | POA: Diagnosis present

## 2016-12-26 DIAGNOSIS — J449 Chronic obstructive pulmonary disease, unspecified: Secondary | ICD-10-CM | POA: Diagnosis present

## 2016-12-26 DIAGNOSIS — F0390 Unspecified dementia without behavioral disturbance: Secondary | ICD-10-CM | POA: Diagnosis present

## 2016-12-26 DIAGNOSIS — Z8679 Personal history of other diseases of the circulatory system: Secondary | ICD-10-CM | POA: Diagnosis not present

## 2016-12-26 DIAGNOSIS — Z881 Allergy status to other antibiotic agents status: Secondary | ICD-10-CM

## 2016-12-26 DIAGNOSIS — Z7902 Long term (current) use of antithrombotics/antiplatelets: Secondary | ICD-10-CM

## 2016-12-26 DIAGNOSIS — Z833 Family history of diabetes mellitus: Secondary | ICD-10-CM

## 2016-12-26 DIAGNOSIS — E8889 Other specified metabolic disorders: Secondary | ICD-10-CM | POA: Diagnosis present

## 2016-12-26 DIAGNOSIS — R5383 Other fatigue: Secondary | ICD-10-CM

## 2016-12-26 DIAGNOSIS — M5412 Radiculopathy, cervical region: Secondary | ICD-10-CM | POA: Diagnosis present

## 2016-12-26 DIAGNOSIS — E669 Obesity, unspecified: Secondary | ICD-10-CM | POA: Diagnosis present

## 2016-12-26 DIAGNOSIS — M109 Gout, unspecified: Secondary | ICD-10-CM | POA: Diagnosis present

## 2016-12-26 DIAGNOSIS — R5381 Other malaise: Secondary | ICD-10-CM

## 2016-12-26 DIAGNOSIS — J309 Allergic rhinitis, unspecified: Secondary | ICD-10-CM | POA: Diagnosis present

## 2016-12-26 DIAGNOSIS — I2510 Atherosclerotic heart disease of native coronary artery without angina pectoris: Secondary | ICD-10-CM | POA: Diagnosis present

## 2016-12-26 DIAGNOSIS — Z992 Dependence on renal dialysis: Secondary | ICD-10-CM

## 2016-12-26 DIAGNOSIS — I252 Old myocardial infarction: Secondary | ICD-10-CM

## 2016-12-26 DIAGNOSIS — Z888 Allergy status to other drugs, medicaments and biological substances status: Secondary | ICD-10-CM

## 2016-12-26 DIAGNOSIS — Z6830 Body mass index (BMI) 30.0-30.9, adult: Secondary | ICD-10-CM

## 2016-12-26 DIAGNOSIS — N186 End stage renal disease: Secondary | ICD-10-CM | POA: Diagnosis not present

## 2016-12-26 DIAGNOSIS — R911 Solitary pulmonary nodule: Secondary | ICD-10-CM | POA: Diagnosis present

## 2016-12-26 DIAGNOSIS — D638 Anemia in other chronic diseases classified elsewhere: Secondary | ICD-10-CM | POA: Diagnosis present

## 2016-12-26 DIAGNOSIS — I255 Ischemic cardiomyopathy: Secondary | ICD-10-CM | POA: Diagnosis present

## 2016-12-26 DIAGNOSIS — Z841 Family history of disorders of kidney and ureter: Secondary | ICD-10-CM

## 2016-12-26 DIAGNOSIS — R531 Weakness: Secondary | ICD-10-CM

## 2016-12-26 DIAGNOSIS — Z87891 Personal history of nicotine dependence: Secondary | ICD-10-CM

## 2016-12-26 DIAGNOSIS — N400 Enlarged prostate without lower urinary tract symptoms: Secondary | ICD-10-CM | POA: Diagnosis present

## 2016-12-26 DIAGNOSIS — I471 Supraventricular tachycardia: Secondary | ICD-10-CM

## 2016-12-26 DIAGNOSIS — G4733 Obstructive sleep apnea (adult) (pediatric): Secondary | ICD-10-CM | POA: Diagnosis not present

## 2016-12-26 DIAGNOSIS — I132 Hypertensive heart and chronic kidney disease with heart failure and with stage 5 chronic kidney disease, or end stage renal disease: Secondary | ICD-10-CM | POA: Diagnosis not present

## 2016-12-26 DIAGNOSIS — E785 Hyperlipidemia, unspecified: Secondary | ICD-10-CM | POA: Diagnosis present

## 2016-12-26 DIAGNOSIS — E1142 Type 2 diabetes mellitus with diabetic polyneuropathy: Secondary | ICD-10-CM | POA: Diagnosis present

## 2016-12-26 DIAGNOSIS — F329 Major depressive disorder, single episode, unspecified: Secondary | ICD-10-CM | POA: Diagnosis present

## 2016-12-26 DIAGNOSIS — Z9889 Other specified postprocedural states: Secondary | ICD-10-CM

## 2016-12-26 DIAGNOSIS — E1122 Type 2 diabetes mellitus with diabetic chronic kidney disease: Secondary | ICD-10-CM | POA: Diagnosis present

## 2016-12-26 DIAGNOSIS — F419 Anxiety disorder, unspecified: Secondary | ICD-10-CM | POA: Diagnosis present

## 2016-12-26 DIAGNOSIS — Z7982 Long term (current) use of aspirin: Secondary | ICD-10-CM

## 2016-12-26 DIAGNOSIS — I5042 Chronic combined systolic (congestive) and diastolic (congestive) heart failure: Secondary | ICD-10-CM | POA: Diagnosis not present

## 2016-12-26 DIAGNOSIS — Z8546 Personal history of malignant neoplasm of prostate: Secondary | ICD-10-CM

## 2016-12-26 DIAGNOSIS — E0590 Thyrotoxicosis, unspecified without thyrotoxic crisis or storm: Secondary | ICD-10-CM | POA: Diagnosis present

## 2016-12-26 DIAGNOSIS — Z9049 Acquired absence of other specified parts of digestive tract: Secondary | ICD-10-CM

## 2016-12-26 DIAGNOSIS — I214 Non-ST elevation (NSTEMI) myocardial infarction: Secondary | ICD-10-CM

## 2016-12-26 LAB — BASIC METABOLIC PANEL
ANION GAP: 18 — AB (ref 5–15)
BUN: 37 mg/dL — ABNORMAL HIGH (ref 6–20)
CHLORIDE: 97 mmol/L — AB (ref 101–111)
CO2: 24 mmol/L (ref 22–32)
Calcium: 9.2 mg/dL (ref 8.9–10.3)
Creatinine, Ser: 9.37 mg/dL — ABNORMAL HIGH (ref 0.61–1.24)
GFR, EST AFRICAN AMERICAN: 6 mL/min — AB (ref >60)
GFR, EST NON AFRICAN AMERICAN: 5 mL/min — AB (ref >60)
Glucose, Bld: 131 mg/dL — ABNORMAL HIGH (ref 65–99)
POTASSIUM: 4.4 mmol/L (ref 3.5–5.1)
SODIUM: 139 mmol/L (ref 135–145)

## 2016-12-26 LAB — CBC
HEMATOCRIT: 34.7 % — AB (ref 39.0–52.0)
Hemoglobin: 10.7 g/dL — ABNORMAL LOW (ref 13.0–17.0)
MCH: 28 pg (ref 26.0–34.0)
MCHC: 30.8 g/dL (ref 30.0–36.0)
MCV: 90.8 fL (ref 78.0–100.0)
Platelets: 315 10*3/uL (ref 150–400)
RBC: 3.82 MIL/uL — AB (ref 4.22–5.81)
RDW: 18.5 % — ABNORMAL HIGH (ref 11.5–15.5)
WBC: 8 10*3/uL (ref 4.0–10.5)

## 2016-12-26 MED ORDER — METOPROLOL TARTRATE 25 MG PO TABS: 25.0000 mg | ORAL_TABLET | Freq: Two times a day (BID) | ORAL | 3 refills | Status: DC

## 2016-12-26 NOTE — ED Notes (Addendum)
Delay in blood work. Unable to obtain blood from pt.

## 2016-12-26 NOTE — Telephone Encounter (Signed)
Got a page from [place] with [employer] [phone]) to report critical lab result of Troponin T 0.157 (normal cutoff <0.011). I was unable to reach Dr. Curt Bears as office is closed. I discussed with patient and Dr. Harl Bowie who is on call, patient states he has episodic SOB esp exertion, but he is doing ok now. Discussing with Dr. Harl Bowie, we feel the safest option would be to instruct patient to seek medical attention at ED where a serial trop can be trended to see if this cardiac. Patient and family are coming to ED.  Also unclear why a Trop T was ordered and not Elizebeth Koller PA Pager: [phone]

## 2016-12-26 NOTE — ED Triage Notes (Signed)
Patinet had lab work at MD office. Called to go to ED for abnormal results. Patient was at cardiac rehab Monday but HR was too high. Pt followed up today with MD.

## 2016-12-26 NOTE — ED Provider Notes (Signed)
[facility] DEPT Provider Note   CSN: [id number] Arrival date & time: 12/26/16  1942     History   Chief Complaint Chief Complaint  Patient presents with  . Fatigue    HPI RADEN BYINGTON is a 66 y.o. male with a past medical history significant for CAD status post PCI, diabetes, hypertension, CHF, ESRD on dialysis TTS, and history of SVT status post ablation last month who presents with severe fatigue, episodic and exertional shortness of breath and a positive troponin at a cardiology clinic visit today. Patient reports that he had shortness of breath several days ago while in cardiac rehabilitation. Patient said that he had a fast heart rate at the time. Patient said that he has been having severe fatigue over the last few days to the point where he feels he can hardly get around. He also says that his shortness of breath has been exertional in nature. He denies chest pain. He does not and she has fluid overload as his legs do not feel swollen. He has not missed any dialysis treatments and has been doing well from that. Patient denies fevers, chills, productive cough, constipation, or diarrhea. He does say that he does not make very much urine. Denies any current palpitations, chest pain, and says his shortness of breath is not bothering him at rest.   Vernard Gambles saw his cardiologist today who performed a troponin. He was called this afternoon saying it was positive and he needed to present to the ED for evaluation and likely admission      The history is provided by the patient, medical records and a relative. No language interpreter was used.  Shortness of Breath  This is a recurrent problem. The problem occurs intermittently.The problem has not changed since onset.Pertinent negatives include no fever, no headaches, no rhinorrhea, no neck pain, no cough, no sputum production, no wheezing, no chest pain, no syncope, no vomiting, no abdominal pain, no rash, no leg pain and no leg swelling. He  has tried nothing for the symptoms. The treatment provided no relief. Associated medical issues include CAD and heart failure.    Past Medical History:  Diagnosis Date  . Allergic rhinitis, cause unspecified 02/24/2014  . Anemia 06/16/2011  . BENIGN PROSTATIC HYPERTROPHY 10/14/2009  . CAD, NATIVE VESSEL 02/06/2009   saw Dr. Missy Sabins last jan 2013  . Cervical radiculopathy, chronic 02/23/2016   Right c5-6 by NCS/EMG  . CHEST PAIN 03/29/2010  . COLONIC POLYPS, HX OF 10/14/2009  . CONGESTIVE HEART FAILURE 06/18/2007  . Dementia [id number]  . DEPRESSION 10/14/2009  . Depression 09/24/2015  . DIABETES MELLITUS, TYPE II 02/01/2010  . DIASTOLIC HEART FAILURE, CHRONIC 02/06/2009  . DIZZINESS 07/17/2010  . DYSLIPIDEMIA 06/18/2007  . DYSPNEA 10/29/2008  . ESRD (end stage renal disease) on dialysis ([place]) 08/04/2010   "TTS;  " (04/18/2015)  . FOOT PAIN 08/12/2008  . GAIT DISTURBANCE 03/03/2010  . GASTROENTERITIS, VIRAL 10/14/2009  . GERD 06/18/2007  . GOITER, MULTINODULAR 12/26/2007  . GOUT 06/18/2007  . GYNECOMASTIA 07/17/2010  . Hemodialysis access, fistula [facility])    Dialysis T-Th-Sa ([place]) Right upper arm fistula  . Hyperlipidemia 10/16/2011  . Hyperparathyroidism, secondary ([place]) 06/16/2011  . HYPERTENSION 06/18/2007  . Hyperthyroidism   . HYPERTHYROIDISM 02/02/2010  . Hypocalcemia 06/07/2010  . Ischemic cardiomyopathy 06/16/2011  . NECK PAIN 07/31/2010  . NSTEMI (non-ST elevated myocardial infarction) ([place]) 08/04/2016  . ONYCHOMYCOSIS, TOENAILS 12/26/2007  . OSA on CPAP 10/16/2011  . Other malaise and  fatigue 11/24/2009  . PERIPHERAL NEUROPATHY 06/18/2007  . Prostate cancer ([place])   . PULMONARY NODULE, RIGHT LOWER LOBE 06/08/2009  . Renal insufficiency   . Sleep apnea    cpap machine and o2  . TRANSAMINASES, SERUM, ELEVATED 02/01/2010  . Transfusion history    none recent  . Unspecified hypotension 01/30/2010    Patient Active Problem List   Diagnosis Date Noted  . SVT  (supraventricular tachycardia) ([place]) 10/16/2016  . Abdominal pain 08/27/2016  . Abnormal findings on diagnostic imaging of liver and biliary tract 08/27/2016  . Bloating symptom 08/27/2016  . Hematochezia 08/27/2016  . Early satiety 08/27/2016  . Eructation 08/27/2016  . Esophageal ulcer 08/27/2016  . Flatulence, eructation and gas pain 08/27/2016  . General weakness 08/27/2016  . Skin sensation disturbance 08/27/2016  . Stricture of esophagus 08/27/2016  . Alternating constipation and diarrhea 08/16/2016  . Status post coronary artery stent placement   . Abnormal EKG   . ST segment depression 08/05/2016  . PAD (peripheral artery disease) ([place]) 06/18/2016  . Transient hypotension 04/29/2016  . Cervical radiculopathy, chronic 02/23/2016  . Memory loss 12/22/2015  . Left-sided weakness 12/22/2015  . Peripheral polyneuropathy ([place]) 12/22/2015  . DM (diabetes mellitus) type II controlled with renal manifestation ([place]) 10/31/2015  . Nausea and vomiting 10/31/2015  . Malignant neoplasm of prostate ([place]) 10/24/2015  . Right leg pain 09/04/2015  . Chest pain with high risk for cardiac etiology 08/09/2015  . Acute encephalopathy 08/09/2015  . S/P right total knee replacement 08/09/2015  . Primary osteoarthritis of right knee 08/05/2015  . Neuropathy due to secondary diabetes mellitus ([place]) 08/05/2015  . Arthritis of knee 08/02/2015  . Headache 06/08/2015  . Melena 04/19/2015  . Acute blood loss anemia 04/18/2015  . Hyperthyroidism 03/11/2015  . Thigh pain 12/08/2014  . Nodule of right lung 09/14/2014  . PSA elevation 08/30/2014  . ESRD on hemodialysis ([place]) 08/16/2014  . Essential hypertension 08/16/2014  . Pre-transplant evaluation for kidney transplant 08/16/2014  . Allergic rhinitis 02/24/2014  . Elevated PSA 02/24/2014  . Vertigo 10/14/2013  . Cough 06/26/2013  . Numbness 05/15/2013  . Chronic systolic heart failure ([place]) 02/09/2013  . Other complications due to renal  dialysis device, implant, and graft 01/14/2013  . Right hand pain 03/28/2012  . Dysphagia 03/28/2012  . Hypogonadism, male 03/28/2012  . OSA (obstructive sleep apnea) 10/16/2011  . Hyperlipidemia 10/16/2011  . CAD (coronary artery disease) 09/28/2011  . NSTEMI (non-ST elevated myocardial infarction) ([place]) 09/28/2011  . AVM (arteriovenous malformation) 09/27/2011  . Anemia associated with acute blood loss 09/26/2011  . Anemia 06/16/2011  . Ischemic cardiomyopathy 06/16/2011  . Hyperparathyroidism, secondary ([place]) 06/16/2011  . Preventative health care 03/11/2011  . NECK PAIN 07/31/2010  . GYNECOMASTIA 07/17/2010  . DIZZINESS 07/17/2010  . GAIT DISTURBANCE 03/03/2010  . Thyrotoxicosis 02/02/2010  . Elevated levels of transaminase & lactic acid dehydrogenase 02/01/2010  . Other malaise and fatigue 11/24/2009  . Depression 10/14/2009  . BENIGN PROSTATIC HYPERTROPHY 10/14/2009  . COLONIC POLYPS, HX OF 10/14/2009  . Dementia 09/02/2009  . PULMONARY NODULE, RIGHT LOWER LOBE 06/08/2009  . Chronic diastolic congestive heart failure ([place]) 02/06/2009  . DYSPNEA 10/29/2008  . FOOT PAIN 08/12/2008  . ONYCHOMYCOSIS, TOENAILS 12/26/2007  . GOITER, MULTINODULAR 12/26/2007  . GOUT 06/18/2007  . Neuropathy ([place]) 06/18/2007  . Hypertensive heart disease with CHF (congestive heart failure) ([place]) 06/18/2007  . Gastro-esophageal reflux disease without esophagitis 06/18/2007    Past Surgical History:  Procedure Laterality Date  .  ARTERIOVENOUS GRAFT PLACEMENT Right 2009   forearm/notes 02/01/2011  . AV FISTULA PLACEMENT  11/07/2011   Procedure: INSERTION OF ARTERIOVENOUS (AV) GORE-TEX GRAFT ARM;  Surgeon: Tinnie Gens, MD;  Location: [facility];  Service: Vascular;  Laterality: Left;  . BACK SURGERY  1998  . BASCILIC VEIN TRANSPOSITION Right 02/27/2013   Procedure: BASCILIC VEIN TRANSPOSITION;  Surgeon: Mal Misty, MD;  Location: [facility];  Service: Vascular;  Laterality: Right;  Right Basilic Vein  Transposition   . CARDIAC CATHETERIZATION N/A 08/06/2016   Procedure: Left Heart Cath and Coronary Angiography;  Surgeon: Jolaine Artist, MD;  Location: [facility] CV LAB;  Service: Cardiovascular;  Laterality: N/A;  . CARDIAC CATHETERIZATION N/A 08/07/2016   Procedure: Coronary/Graft Atherectomy-CSI LAD;  Surgeon: Peter M Martinique, MD;  Location: [facility] CV LAB;  Service: Cardiovascular;  Laterality: N/A;  . CERVICAL SPINE SURGERY  2/09   "to repair nerve problems in my left arm"  . CHOLECYSTECTOMY    . CORONARY ANGIOPLASTY WITH STENT PLACEMENT  06/11/2008  . CORONARY ANGIOPLASTY WITH STENT PLACEMENT  06/2007   TAXUS stent to RCA/notes 01/31/2011  . ESOPHAGOGASTRODUODENOSCOPY  09/28/2011   Procedure: ESOPHAGOGASTRODUODENOSCOPY (EGD);  Surgeon: Missy Sabins, MD;  Location: [facility] ENDOSCOPY;  Service: Endoscopy;  Laterality: N/A;  . ESOPHAGOGASTRODUODENOSCOPY N/A 04/07/2015   Procedure: ESOPHAGOGASTRODUODENOSCOPY (EGD);  Surgeon: Teena Irani, MD;  Location: [facility];  Service: Endoscopy;  Laterality: N/A;  . ESOPHAGOGASTRODUODENOSCOPY N/A 04/19/2015   Procedure: ESOPHAGOGASTRODUODENOSCOPY (EGD);  Surgeon: Arta Silence, MD;  Location[facility] ENDOSCOPY;  Service: Endoscopy;  Laterality: N/A;  . FOREIGN BODY REMOVAL  09/2003   via upper endoscopy/notes 02/12/2011  . GIVENS CAPSULE STUDY  09/30/2011   Procedure: GIVENS CAPSULE STUDY;  Surgeon: Jeryl Columbia, MD;  Location: [facility] ENDOSCOPY;  Service: Endoscopy;  Laterality: N/A;  . INSERTION OF DIALYSIS CATHETER Right 2014  . INSERTION OF DIALYSIS CATHETER Left 02/11/2013   Procedure: INSERTION OF DIALYSIS CATHETER;  Surgeon: Conrad Greentown, MD;  Location: [facility];  Service: Vascular;  Laterality: Left;  Ultrasound guided  . REMOVAL OF A DIALYSIS CATHETER Right 02/11/2013   Procedure: REMOVAL OF A DIALYSIS CATHETER;  Surgeon: Conrad [place], MD;  Location: [facility];  Service: Vascular;  Laterality: Right;  . SAVORY DILATION N/A 04/07/2015   Procedure: SAVORY DILATION;   Surgeon: Teena Irani, MD;  Location: [facility] ENDOSCOPY;  Service: Endoscopy;  Laterality: N/A;  . SHUNTOGRAM N/A 09/20/2011   Procedure: Earney Mallet;  Surgeon: Conrad Panama, MD;  Location: [facility] CATH LAB;  Service: Cardiovascular;  Laterality: N/A;  . SVT ABLATION N/A 11/26/2016   Procedure: SVT Ablation;  Surgeon: Will Meredith Leeds, MD;  Location: [facility] CV LAB;  Service: Cardiovascular;  Laterality: N/A;  . TONSILLECTOMY    . TOTAL KNEE ARTHROPLASTY Right 08/02/2015   Procedure: TOTAL KNEE ARTHROPLASTY;  Surgeon: Renette Butters, MD;  Location: [facility];  Service: Orthopedics;  Laterality: Right;  . VENOGRAM N/A 01/26/2013   Procedure: VENOGRAM;  Surgeon: Angelia Mould, MD;  Location: [facility] CATH LAB;  Service: Cardiovascular;  Laterality: N/A;       Home Medications    Prior to Admission medications   Medication Sig Start Date End Date Taking? Authorizing Provider  acetaminophen (TYLENOL) 325 MG tablet Take 650 mg by mouth every 6 (six) hours as needed for mild pain or headache.    Yes Historical Provider, MD  allopurinol (ZYLOPRIM) 100 MG tablet Take 1 tablet (100 mg total) by mouth daily. 07/20/16  Yes Biagio Borg, MD  aspirin 81 MG tablet Take 81 mg by mouth daily.   Yes Historical Provider, MD  citalopram (CELEXA) 10 MG tablet Take 10 mg by mouth 3 (three) times a week.   Yes Historical Provider, MD  clopidogrel (PLAVIX) 75 MG tablet Take 1 tablet (75 mg total) by mouth daily with breakfast. 09/05/16  Yes Jolaine Artist, MD  colesevelam [facility]) 625 MG tablet TAKE THREE TABLETS BY MOUTH TWICE DAILY WITH MEALS 07/20/16  Yes Biagio Borg, MD  ezetimibe (ZETIA) 10 MG tablet Take 1 tablet (10 mg total) by mouth daily. 03/15/16  Yes Jolaine Artist, MD  gabapentin (NEURONTIN) 300 MG capsule 1-2 tabs by mouth at bedtime for sleep and pain 07/20/16  Yes Biagio Borg, MD  HYDROcodone-acetaminophen (NORCO/VICODIN) 5-325 MG tablet Take 1 tablet by mouth every 6 (six) hours as needed for  moderate pain.   Yes Historical Provider, MD  Lanthanum Carbonate 1000 MG PACK Take 3 packets by mouth 2 (two) times daily.    Yes Historical Provider, MD  methimazole (TAPAZOLE) 5 MG tablet Take 1 tablet (5 mg total) by mouth daily. 10/24/16  Yes Renato Shin, MD  midodrine (PROAMATINE) 10 MG tablet Take 1 tablet (10 mg total) by mouth See admin instructions. Once a day only on dialysis days (Tues/Thurs/Sat) 10/18/16  Yes Belkys A Regalado, MD  multivitamin (RENA-VIT) TABS tablet Take 1 tablet by mouth at bedtime. Reported on 10/24/2015   Yes Historical Provider, MD  pantoprazole (PROTONIX) 40 MG tablet Take 40 mg by mouth 2 (two) times daily.  08/31/16  Yes Historical Provider, MD  SENSIPAR 90 MG tablet Take 90 mg by mouth 2 (two) times daily. 09/18/16  Yes Historical Provider, MD  metoprolol tartrate (LOPRESSOR) 25 MG tablet Take 1 tablet (25 mg total) by mouth 2 (two) times daily. ON NON-HD DAYS Patient not taking: Reported on 12/26/2016 12/26/16   Will Meredith Leeds, MD    Family History Family History  Problem Relation Age of Onset  . Heart disease Sister   . Thyroid nodules Sister   . Heart disease Father   . Diabetes Father   . Kidney failure Father   . Hypertension Father   . Healthy Child   . Healthy Child   . Healthy Child   . Cancer Neg Hx     Social History Social History  Substance Use Topics  . Smoking status: Former Smoker    Packs/day: 1.00    Years: 25.00    Types: Cigarettes    Quit date: 10/01/2005  . Smokeless tobacco: Former Systems developer    Quit date: 10/01/2005     Comment: Quit smoking 2007 Smoked x 25 years 1/2 ppd.  . Alcohol use No     Allergies   Cephalexin; Statins; and Ciprofloxacin   Review of Systems Review of Systems  Constitutional: Positive for fatigue (severe). Negative for chills, diaphoresis and fever.  HENT: Negative for congestion, dental problem and rhinorrhea.   Respiratory: Positive for shortness of breath (exertional). Negative for cough,  sputum production, chest tightness, wheezing and stridor.   Cardiovascular: Negative for chest pain, palpitations, leg swelling and syncope.  Gastrointestinal: Negative for abdominal pain, diarrhea, nausea and vomiting.  Genitourinary: Negative for dysuria and flank pain.  Musculoskeletal: Negative for back pain, neck pain and neck stiffness.  Skin: Negative for rash and wound.  Neurological: Negative for speech difficulty, light-headedness and headaches.  Psychiatric/Behavioral: Negative for agitation.  All other systems reviewed and  are negative.    Physical Exam Updated Vital Signs BP 123/64 (BP Location: Left Arm)   Pulse 90   Temp 98.2 F (36.8 C) (Oral)   Resp 18   SpO2 97%   Physical Exam  Constitutional: He is oriented to person, place, and time. He appears well-developed and well-nourished. No distress.  HENT:  Head: Normocephalic and atraumatic.  Right Ear: External ear normal.  Left Ear: External ear normal.  Nose: Nose normal.  Mouth/Throat: Oropharynx is clear and moist. No oropharyngeal exudate.  Eyes: Conjunctivae and EOM are normal. Pupils are equal, round, and reactive to light.  Neck: Normal range of motion. Neck supple.  Cardiovascular: Normal rate, regular rhythm, normal heart sounds and intact distal pulses.   No murmur heard. Pulmonary/Chest: Effort normal and breath sounds normal. No stridor. No respiratory distress. He has no wheezes. He has no rales. He exhibits no tenderness.  Abdominal: Soft. There is no tenderness. There is no rebound and no guarding.  Musculoskeletal: He exhibits no edema or tenderness.  Neurological: He is alert and oriented to person, place, and time. He displays normal reflexes. No cranial nerve deficit or sensory deficit. He exhibits normal muscle tone. Coordination normal.  Skin: Skin is warm. Capillary refill takes less than 2 seconds. No rash noted. He is not diaphoretic. No erythema. No pallor.  Psychiatric: He has a normal  mood and affect.     ED Treatments / Results  Labs (all labs ordered are listed, but only abnormal results are displayed) Labs Reviewed  BASIC METABOLIC PANEL - Abnormal; Notable for the following:       Result Value   Chloride 97 (*)    Glucose, Bld 131 (*)    BUN 37 (*)    Creatinine, Ser 9.37 (*)    GFR calc non Af Amer 5 (*)    GFR calc Af Amer 6 (*)    Anion gap 18 (*)    All other components within normal limits  CBC - Abnormal; Notable for the following:    RBC 3.82 (*)    Hemoglobin 10.7 (*)    HCT 34.7 (*)    RDW 18.5 (*)    All other components within normal limits  URINALYSIS, ROUTINE W REFLEX MICROSCOPIC  PROTIME-INR  CBG MONITORING, ED  I-STAT TROPOININ, ED    EKG  EKG Interpretation None      ED ECG REPORT   Date: 12/26/2016  Rate: 84  Rhythm: normal sinus rhythm  QRS Axis: normal  Intervals: QT prolonged borderline  ST/T Wave abnormalities: normal  Conduction Disutrbances:none  Narrative Interpretation:   Old EKG Reviewed: unchanged  I have personally reviewed the EKG tracing and agree with the computerized printout as noted.    Radiology Dg Chest 2 View  Result Date: 12/26/2016 CLINICAL DATA:  Acute onset of generalized fatigue. Initial encounter. EXAM: CHEST  2 VIEW COMPARISON:  Chest radiograph performed 10/16/2016 FINDINGS: The lungs are well-aerated. Vascular congestion is noted. Increased interstitial markings may reflect mild interstitial edema. There is no evidence of pleural effusion or pneumothorax. The heart is borderline normal in size. No acute osseous abnormalities are seen. A vascular stent is noted overlying the right axilla. Cervical spinal fusion hardware is partially imaged. IMPRESSION: Vascular congestion. Increased interstitial markings may reflect mild interstitial edema. Electronically Signed   By: Garald Balding M.D.   On: 12/26/2016 21:17    Procedures Procedures (including critical care time)  Medications Ordered in  ED Medications - No data  to display   Initial Impression / Assessment and Plan / ED Course  I have reviewed the triage vital signs and the nursing notes.  Pertinent labs & imaging results that were available during my care of the patient were reviewed by me and considered in my medical decision making (see chart for details).     LEIAM HOPWOOD is a 66 y.o. male with a past medical history significant for CAD status post PCI, diabetes, hypertension, CHF, ESRD on dialysis TTS, and history of SVT status post ablation last month who presents with severe fatigue, episodic and exertional shortness of breath and a positive troponin at a cardiology clinic visit today.   History and exam are seen above.   On exam, patient's lungs did not have significant rales. Patient had no rhonchi. No wheezing. Patient's chest was nontender. Patient's abdomen was nontender. Patient had no significant lower extremity edema. Patient had no focal neurologic deficits.  Given patient's report of exertional shortness of breath with a positive troponin, suspect patient will need admission. Patient will have screening laboratory tests and a repeat troponin drawn. Patient denies current chest pain. Imaging will be obtained to look for fluid overload although this was not suspected on pulmonary exam.  Initial laboratory testing showed elevated creatinine as expected for his ESRD. Chest x-ray showed no evidence of pneumonia but did show some vascular congestion and interstitial edema. Doubt this is the source of his shortness of breath however patient may need some more fluid taken off.  Awaiting repeat troponin prior to admission.  Patient will need admission for further management and workup of his exertional shortness of breath, severe fatigue, and to trend his positive troponin earlier today. Due to difficulty obtaining blood, arterial stick will be completed to get the troponin.     Final Clinical Impressions(s) / ED  Diagnoses   Final diagnoses:  Fatigue    Clinical Impression: 1. Fatigue     Disposition: Admit to Hospitalist service    Courtney Paris, MD 12/27/16 [id number]

## 2016-12-26 NOTE — Progress Notes (Signed)
Cardiac Individual Treatment Plan  Patient Details  Name: [name] MRN: [id number] Date of Birth: Oct 28, 1950 Referring Provider:     [place] from 10/04/2016 in [place]  Referring Provider  Glori Bickers MD      Initial Encounter Date:    CARDIAC REHAB PHASE II ORIENTATION from 10/04/2016 in [place]  Date  10/04/16  Referring Provider  Glori Bickers MD      Visit Diagnosis: 08/04/16 NSTEMI (non-ST elevated myocardial infarction) ([place])  08/04/16 Status post coronary artery stent placement  Patient's Home Medications on Admission:  Current Outpatient Prescriptions:  .  acetaminophen (TYLENOL) 325 MG tablet, Take 650 mg by mouth every 6 (six) hours as needed for mild pain or headache. , Disp: , Rfl:  .  allopurinol (ZYLOPRIM) 100 MG tablet, Take 1 tablet (100 mg total) by mouth daily., Disp: 90 tablet, Rfl: 3 .  aspirin 81 MG tablet, Take 81 mg by mouth daily., Disp: , Rfl:  .  citalopram (CELEXA) 10 MG tablet, Take 10 mg by mouth 3 (three) times a week., Disp: , Rfl:  .  clopidogrel (PLAVIX) 75 MG tablet, Take 1 tablet (75 mg total) by mouth daily with breakfast., Disp: 30 tablet, Rfl: 6 .  colesevelam (WELCHOL) 625 MG tablet, TAKE THREE TABLETS BY MOUTH TWICE DAILY WITH MEALS, Disp: 180 tablet, Rfl: 11 .  ezetimibe (ZETIA) 10 MG tablet, Take 1 tablet (10 mg total) by mouth daily., Disp: 90 tablet, Rfl: 3 .  gabapentin (NEURONTIN) 300 MG capsule, 1-2 tabs by mouth at bedtime for sleep and pain, Disp: 180 capsule, Rfl: 3 .  gabapentin (NEURONTIN) 300 MG capsule, TAKE ONE TO TWO CAPSULES BY MOUTH AT BEDTIME FOR SLEEP AND FOR PAIN, Disp: 180 capsule, Rfl: 2 .  HYDROcodone-acetaminophen (NORCO/VICODIN) 5-325 MG tablet, Take 1 tablet by mouth every 6 (six) hours as needed for moderate pain., Disp: , Rfl:  .  Lanthanum Carbonate 1000 MG PACK, Take 2 packets by mouth 2 (two) times daily.,  Disp: , Rfl:  .  methimazole (TAPAZOLE) 5 MG tablet, Take 1 tablet (5 mg total) by mouth daily., Disp: 30 tablet, Rfl: 11 .  metoprolol tartrate (LOPRESSOR) 25 MG tablet, Take 1 tablet (25 mg total) by mouth 2 (two) times daily. ON NON-HD DAYS, Disp: 144 tablet, Rfl: 3 .  midodrine (PROAMATINE) 10 MG tablet, Take 1 tablet (10 mg total) by mouth See admin instructions. Once a day only on dialysis days (Tues/Thurs/Sat), Disp: 30 tablet, Rfl: 0 .  multivitamin (RENA-VIT) TABS tablet, Take 1 tablet by mouth at bedtime. Reported on 10/24/2015, Disp: , Rfl:  .  pantoprazole (PROTONIX) 40 MG tablet, Take 40 mg by mouth 2 (two) times daily. , Disp: , Rfl:  .  SENSIPAR 90 MG tablet, Take 90 mg by mouth 2 (two) times daily., Disp: , Rfl:   Past Medical History: Past Medical History:  Diagnosis Date  . Allergic rhinitis, cause unspecified 02/24/2014  . Anemia 06/16/2011  . BENIGN PROSTATIC HYPERTROPHY 10/14/2009  . CAD, NATIVE VESSEL 02/06/2009   saw Dr. Missy Sabins last jan 2013  . Cervical radiculopathy, chronic 02/23/2016   Right c5-6 by NCS/EMG  . CHEST PAIN 03/29/2010  . COLONIC POLYPS, HX OF 10/14/2009  . CONGESTIVE HEART FAILURE 06/18/2007  . Dementia [id number]  . DEPRESSION 10/14/2009  . Depression 09/24/2015  . DIABETES MELLITUS, TYPE II 02/01/2010  . DIASTOLIC HEART FAILURE, CHRONIC 02/06/2009  . DIZZINESS 07/17/2010  .  DYSLIPIDEMIA 06/18/2007  . DYSPNEA 10/29/2008  . ESRD (end stage renal disease) on dialysis ([place]) 08/04/2010   "TTS;  " (04/18/2015)  . FOOT PAIN 08/12/2008  . GAIT DISTURBANCE 03/03/2010  . GASTROENTERITIS, VIRAL 10/14/2009  . GERD 06/18/2007  . GOITER, MULTINODULAR 12/26/2007  . GOUT 06/18/2007  . GYNECOMASTIA 07/17/2010  . Hemodialysis access, fistula mature [facility])    Dialysis T-Th-Sa ([place]) Right upper arm fistula  . Hyperlipidemia 10/16/2011  . Hyperparathyroidism, secondary ([place]) 06/16/2011  . HYPERTENSION 06/18/2007  . Hyperthyroidism   . HYPERTHYROIDISM  02/02/2010  . Hypocalcemia 06/07/2010  . Ischemic cardiomyopathy 06/16/2011  . NECK PAIN 07/31/2010  . NSTEMI (non-ST elevated myocardial infarction) ([place]) 08/04/2016  . ONYCHOMYCOSIS, TOENAILS 12/26/2007  . OSA on CPAP 10/16/2011  . Other malaise and fatigue 11/24/2009  . PERIPHERAL NEUROPATHY 06/18/2007  . Prostate cancer ([place])   . PULMONARY NODULE, RIGHT LOWER LOBE 06/08/2009  . Renal insufficiency   . Sleep apnea    cpap machine and o2  . TRANSAMINASES, SERUM, ELEVATED 02/01/2010  . Transfusion history    none recent  . Unspecified hypotension 01/30/2010    Tobacco Use: History  Smoking Status  . Former Smoker  . Packs/day: 1.00  . Years: 25.00  . Types: Cigarettes  . Quit date: 10/01/2005  Smokeless Tobacco  . Former Systems developer  . Quit date: 10/01/2005    Comment: Quit smoking 2007 Smoked x 25 years 1/2 ppd.    Labs: Recent Review Flowsheet Data    Labs for ITP Cardiac and Pulmonary Rehab Latest Ref Rng & Units 08/04/2016 10/16/2016 10/16/2016 10/18/2016 10/24/2016   Cholestrol 0 - 200 mg/dL - - - 268(H) -   LDLCALC 0 - 99 mg/dL - - - 170(H) -   LDLDIRECT mg/dL - - - - -   HDL >40 mg/dL - - - 46 -   Trlycerides <150 mg/dL - - - 262(H) -   Hemoglobin A1c - - - - - 5.5   PHART 7.350 - 7.450 - - - - -   PCO2ART 35.0 - 45.0 mmHg - - - - -   HCO3 20.0 - 28.0 mmol/L - 30.0(H) - - -   TCO2 0 - 100 mmol/L 30 32 29 - -   O2SAT % - 79.0 - - -      Capillary Blood Glucose: Lab Results  Component Value Date   GLUCAP 171 (H) 11/26/2016   GLUCAP 153 (H) 11/26/2016   GLUCAP 153 (H) 11/26/2016   GLUCAP 144 (H) 11/02/2016   GLUCAP 143 (H) 10/29/2016     Exercise Target Goals:    Exercise Program Goal: Individual exercise prescription set with THRR, safety & activity barriers. Participant demonstrates ability to understand and report RPE using BORG scale, to self-measure pulse accurately, and to acknowledge the importance of the exercise prescription.  Exercise Prescription Goal: Starting  with aerobic activity 30 plus minutes a day, 3 days per week for initial exercise prescription. Provide home exercise prescription and guidelines that participant acknowledges understanding prior to discharge.  Activity Barriers & Risk Stratification:     Activity Barriers & Cardiac Risk Stratification - 10/04/16 0858      Activity Barriers & Cardiac Risk Stratification   Activity Barriers Balance Concerns;History of Falls;Muscular Weakness;Deconditioning;Assistive Device;Other (comment)   Comments L knee pain   Cardiac Risk Stratification High      6 Minute Walk:     6 Minute Walk    Row Name 10/04/16 1232  6 Minute Walk   Phase Initial     Distance 0.35 feet     Walk Time 6 minutes     # of Rest Breaks 0     MPH 0.21     METS 2.1     RPE 11     VO2 Peak 7.35     Symptoms No     Resting HR 79 bpm     Resting BP 170/86     Max Ex. HR 108 bpm     Max Ex. BP 158/80     2 Minute Post BP 158/86  10 minute post 158/80        Oxygen Initial Assessment:   Oxygen Re-Evaluation:   Oxygen Discharge (Final Oxygen Re-Evaluation):   Initial Exercise Prescription:     Initial Exercise Prescription - 10/04/16 1300      Date of Initial Exercise RX and Referring Provider   Date 10/04/16   Referring Provider Bensimhon, Daniel MD     Recumbant Bike   Level 2   Minutes 10   METs 2     NuStep   Level 2   Minutes 10   METs 2     Arm Ergometer   Level 1   Minutes 10   METs 2     Prescription Details   Frequency (times per week) 3   Duration Progress to 30 minutes of continuous aerobic without signs/symptoms of physical distress     Intensity   THRR 40-80% of Max Heartrate 65-131   Ratings of Perceived Exertion 11-13   Perceived Dyspnea 0-4     Progression   Progression Continue progressive overload as per policy without signs/symptoms or physical distress.     Resistance Training   Training Prescription Yes   Weight 2lbs   Reps 10-12       Perform Capillary Blood Glucose checks as needed.  Exercise Prescription Changes:     Exercise Prescription Changes    Row Name 10/30/16 1600 11/28/16 1800 12/12/16 1600 12/25/16 1600       Response to Exercise   Blood Pressure (Admit) 130/70 144/90 142/84 116/72    Blood Pressure (Exercise) 114/62 138/82 126/80 122/64    Blood Pressure (Exit) 120/72 140/82 118/80 148/84    Heart Rate (Admit) 9 bpm 91 bpm 100 bpm 94 bpm    Heart Rate (Exercise) 114 bpm 115 bpm 121 bpm 115 bpm    Heart Rate (Exit) 102 bpm 92 bpm 111 bpm 97 bpm    Rating of Perceived Exertion (Exercise) 11 13 12 11     Symptoms none none none none    Comments  -  - decreased WL due to 2 week  layoff s/p SVT ablation 11/26/16. decreased WL due to 2 week  layoff s/p SVT ablation 11/26/16.    Duration Progress to 30 minutes of continuous aerobic without signs/symptoms of physical distress Continue with 30 min of aerobic exercise without signs/symptoms of physical distress. Continue with 30 min of aerobic exercise without signs/symptoms of physical distress. Continue with 30 min of aerobic exercise without signs/symptoms of physical distress.    Intensity THRR unchanged THRR unchanged THRR unchanged THRR unchanged      Progression   Average METs 2 2.1 2 2       Resistance Training   Training Prescription Yes Yes Yes Yes    Weight 2lbs 3lbs 3lbs 3lbs    Reps 10-12 10-15 10-15 10-15    Time  - 10 Minutes 10 Minutes  10 Minutes      Recumbant Bike   Level 2 2 1.5 2    Minutes 15 15 15 15     METs 2 2.1 1.8 1.8      NuStep   Level 3 3 3   -    Minutes 15 15 15   -    METs 2 1.9 1.8  -      Arm Ergometer   Level  -  -  - 1    Minutes  -  -  - 15    METs  -  -  - 2.22      Home Exercise Plan   Plans to continue exercise at  -  -  - Home (comment)  given hip/glute and balance h/o     Frequency  -  -  - Add 2 additional days to program exercise sessions.    Initial Home Exercises Provided  -  -  - 12/14/16       Exercise Review   Progression Yes Yes Yes Yes       Exercise Comments:     Exercise Comments    Row Name 10/30/16 1632 11/28/16 1809 12/12/16 1605 12/19/16 1533     Exercise Comments Reviewed METs and goals. Pt is tolerating exercise fairly well; will continue to monitor exercise progression. Reviewed METs and goals. Pt is tolerating exercise fairly well; will continue to monitor exercise progression. Pt returned to cardiac rehab after a 2 week lay off due to s/p SVT ablation 11/26/16. Reviewed METs and goals. Pt is tolerating exercise progression and standing tolerance. Will continue to monitor,       Exercise Goals and Review:     Exercise Goals    Row Name 11/28/16 1807             Exercise Goals   Increase Physical Activity Yes       Intervention Provide advice, education, support and counseling about physical activity/exercise needs.;Develop an individualized exercise prescription for aerobic and resistive training based on initial evaluation findings, risk stratification, comorbidities and participant's personal goals.       Expected Outcomes Achievement of increased cardiorespiratory fitness and enhanced flexibility, muscular endurance and strength shown through measurements of functional capacity and personal statement of participant.       Increase Strength and Stamina Yes       Intervention Provide advice, education, support and counseling about physical activity/exercise needs.;Develop an individualized exercise prescription for aerobic and resistive training based on initial evaluation findings, risk stratification, comorbidities and participant's personal goals.       Expected Outcomes Achievement of increased cardiorespiratory fitness and enhanced flexibility, muscular endurance and strength shown through measurements of functional capacity and personal statement of participant.          Exercise Goals Re-Evaluation :     Exercise Goals Re-Evaluation    Row Name  11/28/16 1807 12/14/16 1517 12/19/16 1512         Exercise Goal Re-Evaluation   Exercise Goals Review Increase Physical Activity;Increase Strenth and Stamina Increase Physical Activity;Increase Strenth and Stamina (P)  Increase Physical Activity;Increase Strenth and Stamina     Comments Pt has progressed to 30 minutes of continuous exercise in cardiac rehab. Pt has also increased handheld weight from 1 to 3 pounds Reviewed home exercise with pt today.  Pt plans to do hip strengthening/balance for exercise. In order to maintain or improve functional capacity and improve balance. Reviewed THR, pulse, RPE, sign and symptoms, and  when to call 911 or MD.  Also discussed weather considerations and indoor options.  Pt voiced understanding. (P)  Pt stated " feeling stronger and having more energy." Pt also reported " legs feeling stronger" but balance is still a challenge     Expected Outcomes Pt will continue to exercise continuous for 30 minutes and increase in strength/stamina Pt will continue to exercise continuous for 30 minutes and increase in strength/stamina  -         Discharge Exercise Prescription (Final Exercise Prescription Changes):     Exercise Prescription Changes - 12/25/16 1600      Response to Exercise   Blood Pressure (Admit) 116/72   Blood Pressure (Exercise) 122/64   Blood Pressure (Exit) 148/84   Heart Rate (Admit) 94 bpm   Heart Rate (Exercise) 115 bpm   Heart Rate (Exit) 97 bpm   Rating of Perceived Exertion (Exercise) 11   Symptoms none   Comments decreased WL due to 2 week  layoff s/p SVT ablation 11/26/16.   Duration Continue with 30 min of aerobic exercise without signs/symptoms of physical distress.   Intensity THRR unchanged     Progression   Average METs 2     Resistance Training   Training Prescription Yes   Weight 3lbs   Reps 10-15   Time 10 Minutes     Recumbant Bike   Level 2   Minutes 15   METs 1.8     Arm Ergometer   Level 1   Minutes 15    METs 2.22     Home Exercise Plan   Plans to continue exercise at Home (comment)  given hip/glute and balance h/o    Frequency Add 2 additional days to program exercise sessions.   Initial Home Exercises Provided 12/14/16     Exercise Review   Progression Yes      Nutrition:  Target Goals: Understanding of nutrition guidelines, daily intake of sodium 1500mg , cholesterol 200mg , calories 30% from fat and 7% or less from saturated fats, daily to have 5 or more servings of fruits and vegetables.  Biometrics:     Pre Biometrics - 10/04/16 1256      Pre Biometrics   Waist Circumference 48.5 inches   Hip Circumference 31.75 inches   Waist to Hip Ratio 1.53 %   Triceps Skinfold 37 mm   Grip Strength 26 kg   Flexibility 0 in  balance concerns/balance deficit   Single Leg Stand 0 seconds       Nutrition Therapy Plan and Nutrition Goals:     Nutrition Therapy & Goals - 11/09/16 1415      Nutrition Therapy   Diet Renal, Carb Modified, Therapeutic Lifestyle Changes      Personal Nutrition Goals   Nutrition Goal 1-2 lb wt loss/week to a wt loss goal of 6-24 lb at graduation from rehab     [place], educate and counsel regarding individualized specific dietary modifications aiming towards targeted core components such as weight, hypertension, lipid management, diabetes, heart failure and other comorbidities.   Expected Outcomes Short Term Goal: Understand basic principles of dietary content, such as calories, fat, sodium, cholesterol and nutrients.;Long Term Goal: Adherence to prescribed nutrition plan.      Nutrition Discharge: Nutrition Scores:     Nutrition Assessments - 11/09/16 1405      MEDFICTS Scores   Pre Score 33      Nutrition Goals Re-Evaluation:   Nutrition Goals Re-Evaluation:  Nutrition Goals Discharge (Final Nutrition Goals Re-Evaluation):   Psychosocial: Target Goals: Acknowledge presence or absence of  significant depression and/or stress, maximize coping skills, provide positive support system. Participant is able to verbalize types and ability to use techniques and skills needed for reducing stress and depression.  Initial Review & Psychosocial Screening:     Initial Psych Review & Screening - 10/04/16 1835      Initial Review   Source of Stress Concerns Financial      Quality of Life Scores:     Quality of Life - 11/19/16 1545      Quality of Life Scores   Health/Function Pre --  pt quality of life altered by his health status. pt has concerns about his health, especially his energy level. pt routine is focused around his health care needs, pt goes to hemodialysis 3x/wk.  pt has desires to feel better.   GLOBAL Pre --  pt overall qol altered by health status.  pt offered reassurance and emotional support.        PHQ-9: Recent Review Flowsheet Data    Depression screen [facility] 2/9 10/15/2016 12/29/2015 10/24/2015 10/21/2015 01/13/2014   Decreased Interest 1 0 0 0 0   Down, Depressed, Hopeless 1 0 0 0 2   PHQ - 2 Score 2 0 0 0 2   Altered sleeping 1 - - - -   Tired, decreased energy 1 - - - -   Change in appetite 1 - - - -   Feeling bad or failure about yourself  0 - - - -   Trouble concentrating 1 - - - -   Moving slowly or fidgety/restless 0 - - - -   Suicidal thoughts 0 - - - -   PHQ-9 Score 6 - - - -   Difficult doing work/chores Somewhat difficult - - - -     Interpretation of Total Score  Total Score Depression Severity:  1-4 = Minimal depression, 5-9 = Mild depression, 10-14 = Moderate depression, 15-19 = Moderately severe depression, 20-27 = Severe depression   Psychosocial Evaluation and Intervention:     Psychosocial Evaluation - 10/15/16 1541      Psychosocial Evaluation & Interventions   Interventions Stress management education;Relaxation education;Encouraged to exercise with the program and follow exercise prescription   Comments pyschosocial barriers  include hemodialysis schedule (3 days weekly)  and residual fatigue.  pt advised may need to adjust CR schedule accordingly.  pt has stress and depression symptoms related to chronic illness.     Continue Psychosocial Services  Yes      Psychosocial Re-Evaluation:     Psychosocial Re-Evaluation    [place] Name 10/29/16 0753 10/31/16 1653 12/18/16 0715         Psychosocial Re-Evaluation   Current issues with  -  - Current Stress Concerns     Comments pt with many comordities is presently out on medical leave.  psychosocial needs will be reevaluated if pt is able to return to cardiac rehab.  pt returned to cardiac rehab and tolerating light activity without difficulty.  pt is pleased to be able to return. pt with multiple comorbidities which does indicate barriers to rehab participation., including end stage renal disease and diabetes.    pt with multiple comorbidities which does indicate barriers to rehab participation, including end stage renal disease and diabetes.  pt is participating in light stretching exercises at home. Fatigue from HD 3 x weekly limits pt ability to participate in home  exercise on those days. pt was given HE instructions from EP and verbalized interest into incorporating these into his routine, when physically able.       Expected Outcomes  -  - pt will demonstrate good coping skills with positive outlook.       Interventions  - Encouraged to attend Cardiac Rehabilitation for the exercise;Stress management education;Relaxation education Encouraged to attend Cardiac Rehabilitation for the exercise;Stress management education;Relaxation education     Continue Psychosocial Services  No Yes Follow up required by staff     Comments  -  - Pt has ESRD and is on dialysis three times a week.  pt is also diabetic.         Initial Review   Source of Stress Concerns  -  - Chronic Illness;Financial;Unable to participate in former interests or hobbies;Unable to perform yard/household  activities        Psychosocial Discharge (Final Psychosocial Re-Evaluation):     Psychosocial Re-Evaluation - 12/18/16 0715      Psychosocial Re-Evaluation   Current issues with Current Stress Concerns   Comments  pt with multiple comorbidities which does indicate barriers to rehab participation, including end stage renal disease and diabetes.  pt is participating in light stretching exercises at home. Fatigue from HD 3 x weekly limits pt ability to participate in home exercise on those days. pt was given HE instructions from EP and verbalized interest into incorporating these into his routine, when physically able.     Expected Outcomes pt will demonstrate good coping skills with positive outlook.     Interventions Encouraged to attend Cardiac Rehabilitation for the exercise;Stress management education;Relaxation education   Continue Psychosocial Services  Follow up required by staff   Comments Pt has ESRD and is on dialysis three times a week.  pt is also diabetic.       Initial Review   Source of Stress Concerns Chronic Illness;Financial;Unable to participate in former interests or hobbies;Unable to perform yard/household activities      Vocational Rehabilitation: Provide vocational rehab assistance to qualifying candidates.   Vocational Rehab Evaluation & Intervention:     Vocational Rehab - 10/04/16 1832      Initial Vocational Rehab Evaluation & Intervention   Assessment shows need for Vocational Rehabilitation No  Pt unable to return to work due to disability.      Education: Education Goals: Education classes will be provided on a weekly basis, covering required topics. Participant will state understanding/return demonstration of topics presented.  Learning Barriers/Preferences:     Learning Barriers/Preferences - 10/04/16 1356      Learning Barriers/Preferences   Learning Barriers None      Education Topics: Count Your Pulse:  -Group instruction provided by  verbal instruction, demonstration, patient participation and written materials to support subject.  Instructors address importance of being able to find your pulse and how to count your pulse when at home without a heart monitor.  Patients get hands on experience counting their pulse with staff help and individually.   Heart Attack, Angina, and Risk Factor Modification:  -Group instruction provided by verbal instruction, video, and written materials to support subject.  Instructors address signs and symptoms of angina and heart attacks.    Also discuss risk factors for heart disease and how to make changes to improve heart health risk factors.   Functional Fitness:  -Group instruction provided by verbal instruction, demonstration, patient participation, and written materials to support subject.  Instructors address safety measures for doing  things around the house.  Discuss how to get up and down off the floor, how to pick things up properly, how to safely get out of a chair without assistance, and balance training.   Meditation and Mindfulness:  -Group instruction provided by verbal instruction, patient participation, and written materials to support subject.  Instructor addresses importance of mindfulness and meditation practice to help reduce stress and improve awareness.  Instructor also leads participants through a meditation exercise.    Stretching for Flexibility and Mobility:  -Group instruction provided by verbal instruction, patient participation, and written materials to support subject.  Instructors lead participants through series of stretches that are designed to increase flexibility thus improving mobility.  These stretches are additional exercise for major muscle groups that are typically performed during regular warm up and cool down.   Hands Only CPR Anytime:  -Group instruction provided by verbal instruction, video, patient participation and written materials to support  subject.  Instructors co-teach with [facility] video for hands only CPR.  Participants get hands on experience with mannequins.   Nutrition I class: Heart Healthy Eating:  -Group instruction provided by PowerPoint slides, verbal discussion, and written materials to support subject matter. The instructor gives an explanation and review of the Therapeutic Lifestyle Changes diet recommendations, which includes a discussion on lipid goals, dietary fat, sodium, fiber, plant stanol/sterol esters, sugar, and the components of a well-balanced, healthy diet.   CARDIAC REHAB PHASE II EXERCISE from 11/09/2016 in [place]  Date  11/09/16  Educator  RD  Instruction Review Code  Not applicable [class handouts given]      Nutrition II class: Lifestyle Skills:  -Group instruction provided by PowerPoint slides, verbal discussion, and written materials to support subject matter. The instructor gives an explanation and review of label reading, grocery shopping for heart health, heart healthy recipe modifications, and ways to make healthier choices when eating out.   CARDIAC REHAB PHASE II EXERCISE from 11/09/2016 in [place]  Date  11/09/16  Educator  RD  Instruction Review Code  Not applicable [class handouts given]      Diabetes Question & Answer:  -Group instruction provided by PowerPoint slides, verbal discussion, and written materials to support subject matter. The instructor gives an explanation and review of diabetes co-morbidities, pre- and post-prandial blood glucose goals, pre-exercise blood glucose goals, signs, symptoms, and treatment of hypoglycemia and hyperglycemia, and foot care basics.   Diabetes Blitz:  -Group instruction provided by PowerPoint slides, verbal discussion, and written materials to support subject matter. The instructor gives an explanation and review of the physiology behind type 1 and type 2 diabetes, diabetes  medications and rational behind using different medications, pre- and post-prandial blood glucose recommendations and Hemoglobin A1c goals, diabetes diet, and exercise including blood glucose guidelines for exercising safely.    CARDIAC REHAB PHASE II EXERCISE from 11/09/2016 in [place]  Date  11/09/16  Educator  RD  Instruction Review Code  Not applicable [class handouts given]      Portion Distortion:  -Group instruction provided by PowerPoint slides, verbal discussion, written materials, and food models to support subject matter. The instructor gives an explanation of serving size versus portion size, changes in portions sizes over the last 20 years, and what consists of a serving from each food group.   Stress Management:  -Group instruction provided by verbal instruction, video, and written materials to support subject matter.  Instructors review  role of stress in heart disease and how to cope with stress positively.     Exercising on Your Own:  -Group instruction provided by verbal instruction, power point, and written materials to support subject.  Instructors discuss benefits of exercise, components of exercise, frequency and intensity of exercise, and end points for exercise.  Also discuss use of nitroglycerin and activating EMS.  Review options of places to exercise outside of rehab.  Review guidelines for sex with heart disease.   Cardiac Drugs I:  -Group instruction provided by verbal instruction and written materials to support subject.  Instructor reviews cardiac drug classes: antiplatelets, anticoagulants, beta blockers, and statins.  Instructor discusses reasons, side effects, and lifestyle considerations for each drug class.   Cardiac Drugs II:  -Group instruction provided by verbal instruction and written materials to support subject.  Instructor reviews cardiac drug classes: angiotensin converting enzyme inhibitors (ACE-I), angiotensin II  receptor blockers (ARBs), nitrates, and calcium channel blockers.  Instructor discusses reasons, side effects, and lifestyle considerations for each drug class.   Anatomy and Physiology of the Circulatory System:  -Group instruction provided by verbal instruction, video, and written materials to support subject.  Reviews functional anatomy of heart, how it relates to various diagnoses, and what role the heart plays in the overall system.   Knowledge Questionnaire Score:     Knowledge Questionnaire Score - 11/09/16 1405      Knowledge Questionnaire Score   Pre Score 18/28     DM 12/15      Core Components/Risk Factors/Patient Goals at Admission:     Personal Goals and Risk Factors at Admission - 10/04/16 0859      Core Components/Risk Factors/Patient Goals on Admission    Weight Management Yes;Obesity;Weight Loss   Intervention Weight Management: Develop a combined nutrition and exercise program designed to reach desired caloric intake, while maintaining appropriate intake of nutrient and fiber, sodium and fats, and appropriate energy expenditure required for the weight goal.;Weight Management: Provide education and appropriate resources to help participant work on and attain dietary goals.;Weight Management/Obesity: Establish reasonable short term and long term weight goals.;Obesity: Provide education and appropriate resources to help participant work on and attain dietary goals.   Expected Outcomes Short Term: Continue to assess and modify interventions until short term weight is achieved;Weight Maintenance: Understanding of the daily nutrition guidelines, which includes 25-35% calories from fat, 7% or less cal from saturated fats, less than 200mg  cholesterol, less than 1.5gm of sodium, & 5 or more servings of fruits and vegetables daily;Understanding recommendations for meals to include 15-35% energy as protein, 25-35% energy from fat, 35-60% energy from carbohydrates, less than 200mg  of  dietary cholesterol, 20-35 gm of total fiber daily;Weight Loss: Understanding of general recommendations for a balanced deficit meal plan, which promotes 1-2 lb weight loss per week and includes a negative energy balance of [phone] kcal/d;Understanding of distribution of calorie intake throughout the day with the consumption of 4-5 meals/snacks;Weight Gain: Understanding of general recommendations for a high calorie, high protein meal plan that promotes weight gain by distributing calorie intake throughout the day with the consumption for 4-5 meals, snacks, and/or supplements   Sedentary Yes   Intervention Provide advice, education, support and counseling about physical activity/exercise needs.;Develop an individualized exercise prescription for aerobic and resistive training based on initial evaluation findings, risk stratification, comorbidities and participant's personal goals.   Expected Outcomes Achievement of increased cardiorespiratory fitness and enhanced flexibility, muscular endurance and strength shown through measurements of functional capacity and  personal statement of participant.   Increase Strength and Stamina Yes   Intervention Provide advice, education, support and counseling about physical activity/exercise needs.;Develop an individualized exercise prescription for aerobic and resistive training based on initial evaluation findings, risk stratification, comorbidities and participant's personal goals.   Expected Outcomes Achievement of increased cardiorespiratory fitness and enhanced flexibility, muscular endurance and strength shown through measurements of functional capacity and personal statement of participant.   Improve shortness of breath with ADL's Yes   Intervention Provide education, individualized exercise plan and daily activity instruction to help decrease symptoms of SOB with activities of daily living.   Expected Outcomes Short Term: Achieves a reduction of symptoms when  performing activities of daily living.   Diabetes Yes   Intervention Provide education about signs/symptoms and action to take for hypo/hyperglycemia.;Provide education about proper nutrition, including hydration, and aerobic/resistive exercise prescription along with prescribed medications to achieve blood glucose in normal ranges: Fasting glucose 65-99 mg/dL   Expected Outcomes Short Term: Participant verbalizes understanding of the signs/symptoms and immediate care of hyper/hypoglycemia, proper foot care and importance of medication, aerobic/resistive exercise and nutrition plan for blood glucose control.;Long Term: Attainment of HbA1C < 7%.   Lipids Yes   Intervention Provide education and support for participant on nutrition & aerobic/resistive exercise along with prescribed medications to achieve LDL 70mg , HDL >40mg .   Expected Outcomes Short Term: Participant states understanding of desired cholesterol values and is compliant with medications prescribed. Participant is following exercise prescription and nutrition guidelines.;Long Term: Cholesterol controlled with medications as prescribed, with individualized exercise RX and with personalized nutrition plan. Value goals: LDL < 70mg , HDL > 40 mg.   Stress Yes   Intervention Offer individual and/or small group education and counseling on adjustment to heart disease, stress management and health-related lifestyle change. Teach and support self-help strategies.;Refer participants experiencing significant psychosocial distress to appropriate mental health specialists for further evaluation and treatment. When possible, include family members and significant others in education/counseling sessions.   Expected Outcomes Short Term: Participant demonstrates changes in health-related behavior, relaxation and other stress management skills, ability to obtain effective social support, and compliance with psychotropic medications if prescribed.;Long Term:  Emotional wellbeing is indicated by absence of clinically significant psychosocial distress or social isolation.   Personal Goal Other Yes   Personal Goal short: improve strength, gait and stability    long: improve standing tolerance and UE strength   Intervention Provide exercise programming to assist with improving cardiovascular fitness and exercise tolerance. Provide an exercise routine to assist with improving strength, balance and stability.   Expected Outcomes Pt will be able to improve standing tolerance and global strength to improve overall quality of life.      Core Components/Risk Factors/Patient Goals Review:      Goals and Risk Factor Review    Row Name 10/31/16 1459 11/28/16 1816 12/26/16 1218         Core Components/Risk Factors/Patient Goals Review   Personal Goals Review Increase Strength and Stamina Other Weight Management/Obesity;Diabetes;Lipids;Improve shortness of breath with ADL's;Stress;Other     Review Pt stated still needs improvement in UE strength, however does feel better after each cardiac rehab session. Pt stated feeling muscle soreness in a good way; feels as though getting stronger Pt stated " balance and standing tolerance has improved but still rely on walker for added support."  Pt also reported " feeling confident in walking around the house." pt balance and standing tolerance improving.  pt continues to use walker for  support and safety. pt with frequent CR absences due to medical complications, wrist pain and most recently tachycardia.      Expected Outcomes Pt will continue to improve in cardiorespiratory fitness, strength and endurance Pt wll continue to improve in balance, gait and functional mobility Pt wll continue to improve in balance, gait and functional mobility        Core Components/Risk Factors/Patient Goals at Discharge (Final Review):      Goals and Risk Factor Review - 12/26/16 1218      Core Components/Risk Factors/Patient Goals  Review   Personal Goals Review Weight Management/Obesity;Diabetes;Lipids;Improve shortness of breath with ADL's;Stress;Other   Review pt balance and standing tolerance improving.  pt continues to use walker for support and safety. pt with frequent CR absences due to medical complications, wrist pain and most recently tachycardia.    Expected Outcomes Pt wll continue to improve in balance, gait and functional mobility      ITP Comments:     ITP Comments    Row Name 10/04/16 0857           ITP Comments Dr. Fransico Him, Medical Director          Comments: Pt is making expected progress toward personal goals after completing 14 sessions. Recommend continued exercise and life style modification education including  stress management and relaxation techniques to decrease cardiac risk profile.

## 2016-12-26 NOTE — Patient Instructions (Signed)
Medication Instructions:    Your physician recommends that you continue on your current medications as directed. Please refer to the Current Medication list given to you today.  --- If you need a refill on your cardiac medications before your next appointment, please call your pharmacy. ---  Labwork:  STAT Troponin T today  Testing/Procedures:  None ordered  Follow-Up:  Your physician recommends that you schedule a follow-up appointment in: 3 months with Dr. Curt Bears.  Thank you for choosing [facility]!!   Trinidad Curet, RN [phone]

## 2016-12-26 NOTE — ED Notes (Signed)
Patient transported to X-ray 

## 2016-12-27 ENCOUNTER — Encounter (HOSPITAL_COMMUNITY): Payer: Self-pay | Admitting: Internal Medicine

## 2016-12-27 DIAGNOSIS — E0590 Thyrotoxicosis, unspecified without thyrotoxic crisis or storm: Secondary | ICD-10-CM | POA: Diagnosis present

## 2016-12-27 DIAGNOSIS — R5381 Other malaise: Secondary | ICD-10-CM

## 2016-12-27 DIAGNOSIS — R071 Chest pain on breathing: Secondary | ICD-10-CM | POA: Diagnosis not present

## 2016-12-27 DIAGNOSIS — I2510 Atherosclerotic heart disease of native coronary artery without angina pectoris: Secondary | ICD-10-CM

## 2016-12-27 DIAGNOSIS — J309 Allergic rhinitis, unspecified: Secondary | ICD-10-CM | POA: Diagnosis present

## 2016-12-27 DIAGNOSIS — G4733 Obstructive sleep apnea (adult) (pediatric): Secondary | ICD-10-CM | POA: Diagnosis present

## 2016-12-27 DIAGNOSIS — I5042 Chronic combined systolic (congestive) and diastolic (congestive) heart failure: Secondary | ICD-10-CM | POA: Diagnosis present

## 2016-12-27 DIAGNOSIS — F0390 Unspecified dementia without behavioral disturbance: Secondary | ICD-10-CM | POA: Diagnosis present

## 2016-12-27 DIAGNOSIS — R5383 Other fatigue: Secondary | ICD-10-CM

## 2016-12-27 DIAGNOSIS — D638 Anemia in other chronic diseases classified elsewhere: Secondary | ICD-10-CM | POA: Diagnosis present

## 2016-12-27 DIAGNOSIS — R531 Weakness: Secondary | ICD-10-CM

## 2016-12-27 DIAGNOSIS — I255 Ischemic cardiomyopathy: Secondary | ICD-10-CM | POA: Diagnosis present

## 2016-12-27 DIAGNOSIS — R748 Abnormal levels of other serum enzymes: Secondary | ICD-10-CM | POA: Diagnosis not present

## 2016-12-27 DIAGNOSIS — F419 Anxiety disorder, unspecified: Secondary | ICD-10-CM | POA: Diagnosis present

## 2016-12-27 DIAGNOSIS — E8779 Other fluid overload: Secondary | ICD-10-CM | POA: Diagnosis present

## 2016-12-27 DIAGNOSIS — D631 Anemia in chronic kidney disease: Secondary | ICD-10-CM | POA: Diagnosis not present

## 2016-12-27 DIAGNOSIS — N2581 Secondary hyperparathyroidism of renal origin: Secondary | ICD-10-CM | POA: Diagnosis present

## 2016-12-27 DIAGNOSIS — I132 Hypertensive heart and chronic kidney disease with heart failure and with stage 5 chronic kidney disease, or end stage renal disease: Secondary | ICD-10-CM | POA: Diagnosis present

## 2016-12-27 DIAGNOSIS — I120 Hypertensive chronic kidney disease with stage 5 chronic kidney disease or end stage renal disease: Secondary | ICD-10-CM | POA: Diagnosis not present

## 2016-12-27 DIAGNOSIS — M5412 Radiculopathy, cervical region: Secondary | ICD-10-CM | POA: Diagnosis present

## 2016-12-27 DIAGNOSIS — Z992 Dependence on renal dialysis: Secondary | ICD-10-CM | POA: Diagnosis not present

## 2016-12-27 DIAGNOSIS — E8889 Other specified metabolic disorders: Secondary | ICD-10-CM | POA: Diagnosis present

## 2016-12-27 DIAGNOSIS — E785 Hyperlipidemia, unspecified: Secondary | ICD-10-CM | POA: Diagnosis present

## 2016-12-27 DIAGNOSIS — E669 Obesity, unspecified: Secondary | ICD-10-CM | POA: Diagnosis present

## 2016-12-27 DIAGNOSIS — E1122 Type 2 diabetes mellitus with diabetic chronic kidney disease: Secondary | ICD-10-CM | POA: Diagnosis present

## 2016-12-27 DIAGNOSIS — N186 End stage renal disease: Secondary | ICD-10-CM | POA: Diagnosis not present

## 2016-12-27 DIAGNOSIS — M109 Gout, unspecified: Secondary | ICD-10-CM | POA: Diagnosis present

## 2016-12-27 DIAGNOSIS — E1129 Type 2 diabetes mellitus with other diabetic kidney complication: Secondary | ICD-10-CM | POA: Diagnosis not present

## 2016-12-27 DIAGNOSIS — F329 Major depressive disorder, single episode, unspecified: Secondary | ICD-10-CM | POA: Diagnosis present

## 2016-12-27 DIAGNOSIS — I252 Old myocardial infarction: Secondary | ICD-10-CM | POA: Diagnosis not present

## 2016-12-27 DIAGNOSIS — J449 Chronic obstructive pulmonary disease, unspecified: Secondary | ICD-10-CM | POA: Diagnosis present

## 2016-12-27 DIAGNOSIS — N400 Enlarged prostate without lower urinary tract symptoms: Secondary | ICD-10-CM | POA: Diagnosis present

## 2016-12-27 DIAGNOSIS — K219 Gastro-esophageal reflux disease without esophagitis: Secondary | ICD-10-CM | POA: Diagnosis present

## 2016-12-27 LAB — TROPONIN I
Troponin I: 0.03 ng/mL
Troponin I: 0.03 ng/mL

## 2016-12-27 LAB — HEPATIC FUNCTION PANEL
ALT: 16 U/L — ABNORMAL LOW (ref 17–63)
AST: 16 U/L (ref 15–41)
Albumin: 2.9 g/dL — ABNORMAL LOW (ref 3.5–5.0)
Alkaline Phosphatase: 65 U/L (ref 38–126)
Bilirubin, Direct: <0.1 mg/dL — ABNORMAL LOW (ref 0.1–0.5)
TOTAL PROTEIN: 6.8 g/dL (ref 6.5–8.1)
Total Bilirubin: 0.7 mg/dL (ref 0.3–1.2)

## 2016-12-27 LAB — CBC
HEMATOCRIT: 34.2 % — AB (ref 39.0–52.0)
HEMOGLOBIN: 10.6 g/dL — AB (ref 13.0–17.0)
MCH: 28 pg (ref 26.0–34.0)
MCHC: 31 g/dL (ref 30.0–36.0)
MCV: 90.5 fL (ref 78.0–100.0)
Platelets: 315 10*3/uL (ref 150–400)
RBC: 3.78 MIL/uL — ABNORMAL LOW (ref 4.22–5.81)
RDW: 18.5 % — AB (ref 11.5–15.5)
WBC: 6.1 10*3/uL (ref 4.0–10.5)

## 2016-12-27 LAB — GLUCOSE, CAPILLARY
GLUCOSE-CAPILLARY: 120 mg/dL — AB (ref 65–99)
GLUCOSE-CAPILLARY: 149 mg/dL — AB (ref 65–99)
GLUCOSE-CAPILLARY: 128 mg/dL — AB (ref 65–99)
GLUCOSE-CAPILLARY: 142 mg/dL — AB (ref 65–99)
Glucose-Capillary: 127 mg/dL — ABNORMAL HIGH (ref 65–99)

## 2016-12-27 LAB — BASIC METABOLIC PANEL
ANION GAP: 18 — AB (ref 5–15)
BUN: 41 mg/dL — ABNORMAL HIGH (ref 6–20)
CO2: 24 mmol/L (ref 22–32)
Calcium: 9.2 mg/dL (ref 8.9–10.3)
Chloride: 98 mmol/L — ABNORMAL LOW (ref 101–111)
Creatinine, Ser: 10.42 mg/dL — ABNORMAL HIGH (ref 0.61–1.24)
GFR calc Af Amer: 5 mL/min — ABNORMAL LOW (ref >60)
GFR, EST NON AFRICAN AMERICAN: 4 mL/min — AB (ref >60)
GLUCOSE: 144 mg/dL — AB (ref 65–99)
POTASSIUM: 4.2 mmol/L (ref 3.5–5.1)
SODIUM: 140 mmol/L (ref 135–145)

## 2016-12-27 LAB — TROPONIN T: Troponin T TROPT: 0.157 ng/mL (ref <0.011)

## 2016-12-27 LAB — PROTIME-INR
INR: 1.02
PROTHROMBIN TIME: 13.4 s (ref 11.4–15.2)

## 2016-12-27 LAB — POCT I-STAT TROPONIN I: Troponin i, poc: 0.02 ng/mL (ref 0.00–0.08)

## 2016-12-27 LAB — TSH: TSH: 1.011 u[IU]/mL (ref 0.350–4.500)

## 2016-12-27 LAB — MRSA PCR SCREENING: MRSA BY PCR: NEGATIVE

## 2016-12-27 LAB — MAGNESIUM: MAGNESIUM: 2.1 mg/dL (ref 1.7–2.4)

## 2016-12-27 LAB — D-DIMER, QUANTITATIVE (NOT AT ARMC): D DIMER QUANT: 2.61 ug{FEU}/mL — AB (ref 0.00–0.50)

## 2016-12-27 MED ORDER — GABAPENTIN 300 MG PO CAPS
300.0000 mg | ORAL_CAPSULE | Freq: Every day | ORAL | Status: DC
  Administered 2016-12-27: 300 mg via ORAL
  Filled 2016-12-27: qty 1

## 2016-12-27 MED ORDER — ASPIRIN 81 MG PO CHEW
81.0000 mg | CHEWABLE_TABLET | Freq: Every day | ORAL | Status: DC
  Administered 2016-12-27 – 2016-12-28 (×2): 81 mg via ORAL
  Filled 2016-12-27 (×2): qty 1

## 2016-12-27 MED ORDER — PANTOPRAZOLE SODIUM 40 MG PO TBEC
40.0000 mg | DELAYED_RELEASE_TABLET | Freq: Two times a day (BID) | ORAL | Status: DC
  Administered 2016-12-27 – 2016-12-28 (×3): 40 mg via ORAL
  Filled 2016-12-27 (×4): qty 1

## 2016-12-27 MED ORDER — GABAPENTIN 300 MG PO CAPS
300.0000 mg | ORAL_CAPSULE | Freq: Once | ORAL | Status: AC
  Administered 2016-12-27: 300 mg via ORAL
  Filled 2016-12-27: qty 1

## 2016-12-27 MED ORDER — CITALOPRAM HYDROBROMIDE 10 MG PO TABS
10.0000 mg | ORAL_TABLET | ORAL | Status: DC
  Administered 2016-12-28: 10 mg via ORAL
  Filled 2016-12-27: qty 1

## 2016-12-27 MED ORDER — METOPROLOL TARTRATE 25 MG PO TABS
25.0000 mg | ORAL_TABLET | ORAL | Status: DC
  Administered 2016-12-28: 25 mg via ORAL
  Filled 2016-12-27: qty 1

## 2016-12-27 MED ORDER — LANTHANUM CARBONATE 500 MG PO CHEW
3000.0000 mg | CHEWABLE_TABLET | Freq: Two times a day (BID) | ORAL | Status: DC
Start: 2016-12-27 — End: 2016-12-28
  Administered 2016-12-27 – 2016-12-28 (×2): 3000 mg via ORAL
  Filled 2016-12-27 (×5): qty 6

## 2016-12-27 MED ORDER — INSULIN ASPART 100 UNIT/ML ~~LOC~~ SOLN: 0.0000 [IU] | Freq: Three times a day (TID) | SUBCUTANEOUS | Status: DC

## 2016-12-27 MED ORDER — CLOPIDOGREL BISULFATE 75 MG PO TABS
75.0000 mg | ORAL_TABLET | Freq: Every day | ORAL | Status: DC
  Administered 2016-12-27 – 2016-12-28 (×2): 75 mg via ORAL
  Filled 2016-12-27 (×2): qty 1

## 2016-12-27 MED ORDER — ACETAMINOPHEN 325 MG PO TABS: 650.0000 mg | ORAL_TABLET | Freq: Four times a day (QID) | ORAL | Status: DC | PRN

## 2016-12-27 MED ORDER — COLESEVELAM HCL 625 MG PO TABS
1875.0000 mg | ORAL_TABLET | Freq: Two times a day (BID) | ORAL | Status: DC
  Administered 2016-12-27 – 2016-12-28 (×2): 1875 mg via ORAL
  Filled 2016-12-27 (×5): qty 3

## 2016-12-27 MED ORDER — MIDODRINE HCL 5 MG PO TABS
10.0000 mg | ORAL_TABLET | ORAL | Status: DC
  Administered 2016-12-27: 10 mg via ORAL
  Filled 2016-12-27: qty 2

## 2016-12-27 MED ORDER — HEPARIN SODIUM (PORCINE) 5000 UNIT/ML IJ SOLN
5000.0000 [IU] | Freq: Three times a day (TID) | INTRAMUSCULAR | Status: DC
  Administered 2016-12-27: 5000 [IU] via SUBCUTANEOUS
  Filled 2016-12-27 (×3): qty 1

## 2016-12-27 MED ORDER — ACETAMINOPHEN 650 MG RE SUPP: 650.0000 mg | Freq: Four times a day (QID) | RECTAL | Status: DC | PRN

## 2016-12-27 MED ORDER — CINACALCET HCL 30 MG PO TABS
90.0000 mg | ORAL_TABLET | Freq: Two times a day (BID) | ORAL | Status: DC
  Administered 2016-12-27 – 2016-12-28 (×2): 90 mg via ORAL
  Filled 2016-12-27 (×2): qty 3

## 2016-12-27 MED ORDER — DOXERCALCIFEROL 4 MCG/2ML IV SOLN: 6.0000 ug | INTRAVENOUS | Status: DC

## 2016-12-27 MED ORDER — RENA-VITE PO TABS
1.0000 | ORAL_TABLET | Freq: Every day | ORAL | Status: DC
  Administered 2016-12-27: 1 via ORAL
  Filled 2016-12-27: qty 1

## 2016-12-27 MED ORDER — SODIUM CHLORIDE 0.9 % IV SOLN: 62.5000 mg | INTRAVENOUS | Status: DC

## 2016-12-27 MED ORDER — EZETIMIBE 10 MG PO TABS
10.0000 mg | ORAL_TABLET | Freq: Every day | ORAL | Status: DC
  Administered 2016-12-27 – 2016-12-28 (×2): 10 mg via ORAL
  Filled 2016-12-27 (×2): qty 1

## 2016-12-27 MED ORDER — ALLOPURINOL 100 MG PO TABS
100.0000 mg | ORAL_TABLET | Freq: Every day | ORAL | Status: DC
  Administered 2016-12-27 – 2016-12-28 (×2): 100 mg via ORAL
  Filled 2016-12-27 (×2): qty 1

## 2016-12-27 MED ORDER — DARBEPOETIN ALFA 100 MCG/0.5ML IJ SOSY: 100.0000 ug | PREFILLED_SYRINGE | INTRAMUSCULAR | Status: DC

## 2016-12-27 MED ORDER — METHIMAZOLE 10 MG PO TABS
5.0000 mg | ORAL_TABLET | Freq: Every day | ORAL | Status: DC
Start: 2016-12-27 — End: 2016-12-28
  Administered 2016-12-27 – 2016-12-28 (×2): 5 mg via ORAL
  Filled 2016-12-27 (×3): qty 1

## 2016-12-27 MED ORDER — HYDROCODONE-ACETAMINOPHEN 5-325 MG PO TABS
1.0000 | ORAL_TABLET | Freq: Four times a day (QID) | ORAL | Status: DC | PRN
  Administered 2016-12-27: 1 via ORAL
  Filled 2016-12-27: qty 1

## 2016-12-27 NOTE — Progress Notes (Signed)
Pt seen and examined at bedside, admitted after midnight, please see earlier admission note by Dr. Hal Hope. Pt admitted for evaluation of exertional dyspnea and fatigue. Pt asking to see his cardiologist. Nephrology consulted as pt with ESRD on HD TTS, cardiology also consulted. Appreciate assistance.   Faye Ramsay, MD  [facility] Pager [phone]  If 7PM-7AM, please contact night-coverage [email or web address] Password [email or web address]

## 2016-12-27 NOTE — Consult Note (Signed)
Advanced Heart Failure Team Consult Note  Referring Physician: Doyle Askew Primary Physician: Dr. [name] Primary Cardiologist:  Dr. [name]  EP: Curt Bears Nephrologist: Dr. 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  Pt s/p AVNRT ablation on 11/26/16.   Labs drawn in Dr. Curt Bears office 3/28.18 with Tropinin T - 0.157.  Sent to ED for further evaluation.   Pertinent labs on admission include Creatinine 10.42, K 4.2, Hgb 10.6, Troponin I 0.03.   Pt states he has not had ANY chest pain. Pt states she he has felt more fatigued since ablation 11/26/16. Denies palpitations, chest pain, LE edema, lightheadedness, dizziness, or syncope/pre-syncope. States he woke from sleep a week ago very SOB. He states he had a strange dream and wonders if he had a panic attack. Symptoms quickly subsided without any intervention. Pt states he is SOB walking from one side of the room to the other, which is somewhat worse than his baseline.  Pt has not missed any dialysis and takes all medication as directed.   Feels weak. But otherwise ok. Denies CP or SOB. No edema. No recurrent palpitations.   Echo 10/17/16 LVEF 40-45%, Grade 2 DD. Trivial MR, Mild LAE, Normal RV.  Review of Systems: [y] = yes, [ ]  = no   General: Weight gain [ ] ; Weight loss [ ] ; Anorexia [ ] ; Fatigue [y]; Fever [ ] ; Chills [ ] ; Weakness Blue.Reese ]  Cardiac: Chest pain/pressure [ ] ; Resting SOB [ ] ; Exertional SOB [y]; Orthopnea [ ] ; Pedal Edema [ ] ; Palpitations [ ] ; Syncope [ ] ; Presyncope [ ] ; Paroxysmal nocturnal dyspnea[ ]   Pulmonary: Cough [ ] ; Wheezing[ ] ; Hemoptysis[ ] ; Sputum [ ] ; Snoring [ ]   GI: Vomiting[ ] ;  Dysphagia[ ] ; Melena[ ] ; Hematochezia [ ] ; Heartburn[ ] ; Abdominal pain [ ] ; Constipation [ ] ; Diarrhea [ ] ; BRBPR [ ]   GU: Hematuria[ ] ; Dysuria [ ] ; Nocturia[ ]   Vascular: Pain in legs with walking [ ] ; Pain in feet with lying flat [ ] ; Non-healing sores [ ] ; Stroke [ ] ; TIA [ ] ; Slurred speech [ ] ;  Neuro: Headaches[ ] ; Vertigo[ ] ; Seizures[ ] ; Paresthesias[ ] ;Blurred vision [ ] ; Diplopia [ ] ; Vision changes [ ]   Ortho/Skin: Arthritis [y]; Joint pain [y]; Muscle pain [ ] ; Joint swelling [ ] ; Back Pain [ ] ; Rash [ ]   Psych: Depression[y ]; Anxiety[y]  Heme: Bleeding problems [ ] ; Clotting disorders [ ] ; Anemia [ ]   Endocrine: Diabetes [ ] ; Thyroid dysfunction[ ]   Home Medications Prior to Admission medications   Medication Sig Start Date End Date Taking? Authorizing Provider  acetaminophen (TYLENOL) 325 MG tablet Take 650 mg by mouth every 6 (six) hours as needed for mild pain or headache.    Yes Historical Provider, MD  allopurinol (ZYLOPRIM) 100 MG tablet Take 1 tablet (100 mg total) by mouth daily. 07/20/16  Yes Biagio Borg, MD  aspirin 81 MG tablet Take 81 mg by mouth daily.   Yes Historical Provider, MD  citalopram (CELEXA) 10 MG tablet Take 10 mg by mouth 3 (three) times a week.   Yes Historical Provider, MD  clopidogrel (PLAVIX) 75 MG tablet Take 1 tablet (75 mg total)  by mouth daily with breakfast. 09/05/16  Yes Jolaine Artist, MD  colesevelam [facility]) 625 MG tablet TAKE THREE TABLETS BY MOUTH TWICE DAILY WITH MEALS 07/20/16  Yes Biagio Borg, MD  ezetimibe (ZETIA) 10 MG tablet Take 1 tablet (10 mg total) by mouth daily. 03/15/16  Yes Jolaine Artist, MD  gabapentin (NEURONTIN) 300 MG capsule 1-2 tabs by mouth at bedtime for sleep and pain 07/20/16  Yes Biagio Borg, MD  HYDROcodone-acetaminophen (NORCO/VICODIN) 5-325 MG tablet Take 1 tablet by mouth every 6 (six) hours as needed for moderate pain.   Yes Historical Provider, MD  Lanthanum Carbonate 1000 MG PACK Take 3 packets by  mouth 2 (two) times daily.    Yes Historical Provider, MD  methimazole (TAPAZOLE) 5 MG tablet Take 1 tablet (5 mg total) by mouth daily. 10/24/16  Yes Renato Shin, MD  midodrine (PROAMATINE) 10 MG tablet Take 1 tablet (10 mg total) by mouth See admin instructions. Once a day only on dialysis days (Tues/Thurs/Sat) 10/18/16  Yes Belkys A Regalado, MD  multivitamin (RENA-VIT) TABS tablet Take 1 tablet by mouth at bedtime. Reported on 10/24/2015   Yes Historical Provider, MD  pantoprazole (PROTONIX) 40 MG tablet Take 40 mg by mouth 2 (two) times daily.  08/31/16  Yes Historical Provider, MD  SENSIPAR 90 MG tablet Take 90 mg by mouth 2 (two) times daily. 09/18/16  Yes Historical Provider, MD  metoprolol tartrate (LOPRESSOR) 25 MG tablet Take 1 tablet (25 mg total) by mouth 2 (two) times daily. ON NON-HD DAYS Patient not taking: Reported on 12/26/2016 12/26/16   Will Meredith Leeds, MD    Past Medical History: Past Medical History:  Diagnosis Date  . Allergic rhinitis, cause unspecified 02/24/2014  . Anemia 06/16/2011  . BENIGN PROSTATIC HYPERTROPHY 10/14/2009  . CAD, NATIVE VESSEL 02/06/2009   saw Dr. Missy Sabins last jan 2013  . Cervical radiculopathy, chronic 02/23/2016   Right c5-6 by NCS/EMG  . CHEST PAIN 03/29/2010  . COLONIC POLYPS, HX OF 10/14/2009  . CONGESTIVE HEART FAILURE 06/18/2007  . Dementia [id number]  . DEPRESSION 10/14/2009  . Depression 09/24/2015  . DIABETES MELLITUS, TYPE II 02/01/2010  . DIASTOLIC HEART FAILURE, CHRONIC 02/06/2009  . DIZZINESS 07/17/2010  . DYSLIPIDEMIA 06/18/2007  . DYSPNEA 10/29/2008  . ESRD (end stage renal disease) on dialysis ([place]) 08/04/2010   "TTS;  " (04/18/2015)  . FOOT PAIN 08/12/2008  . GAIT DISTURBANCE 03/03/2010  . GASTROENTERITIS, VIRAL 10/14/2009  . GERD 06/18/2007  . GOITER, MULTINODULAR 12/26/2007  . GOUT 06/18/2007  . GYNECOMASTIA 07/17/2010  . Hemodialysis access, fistula mature [facility])    Dialysis T-Th-Sa ([place]) Right upper arm  fistula  . Hyperlipidemia 10/16/2011  . Hyperparathyroidism, secondary ([place]) 06/16/2011  . HYPERTENSION 06/18/2007  . Hyperthyroidism   . HYPERTHYROIDISM 02/02/2010  . Hypocalcemia 06/07/2010  . Ischemic cardiomyopathy 06/16/2011  . NECK PAIN 07/31/2010  . NSTEMI (non-ST elevated myocardial infarction) ([place]) 08/04/2016  . ONYCHOMYCOSIS, TOENAILS 12/26/2007  . OSA on CPAP 10/16/2011  . Other malaise and fatigue 11/24/2009  . PERIPHERAL NEUROPATHY 06/18/2007  . Prostate cancer ([place])   . PULMONARY NODULE, RIGHT LOWER LOBE 06/08/2009  . Renal insufficiency   . Sleep apnea    cpap machine and o2  . TRANSAMINASES, SERUM, ELEVATED 02/01/2010  . Transfusion history    none recent  . Unspecified hypotension 01/30/2010    Past Surgical History: Past Surgical History:  Procedure Laterality Date  . ARTERIOVENOUS GRAFT PLACEMENT Right 2009  forearm/notes 02/01/2011  . AV FISTULA PLACEMENT  11/07/2011   Procedure: INSERTION OF ARTERIOVENOUS (AV) GORE-TEX GRAFT ARM;  Surgeon: Tinnie Gens, MD;  Location: [place];  Service: Vascular;  Laterality: Left;  . BACK SURGERY  1998  . BASCILIC VEIN TRANSPOSITION Right 02/27/2013   Procedure: BASCILIC VEIN TRANSPOSITION;  Surgeon: Mal Misty, MD;  Location: [place];  Service: Vascular;  Laterality: Right;  Right Basilic Vein Transposition   . CARDIAC CATHETERIZATION N/A 08/06/2016   Procedure: Left Heart Cath and Coronary Angiography;  Surgeon: Jolaine Artist, MD;  Location: [place] CV LAB;  Service: Cardiovascular;  Laterality: N/A;  . CARDIAC CATHETERIZATION N/A 08/07/2016   Procedure: Coronary/Graft Atherectomy-CSI LAD;  Surgeon: Peter M Martinique, MD;  Location: [place] CV LAB;  Service: Cardiovascular;  Laterality: N/A;  . CERVICAL SPINE SURGERY  2/09   "to repair nerve problems in my left arm"  . CHOLECYSTECTOMY    . CORONARY ANGIOPLASTY WITH STENT PLACEMENT  06/11/2008  . CORONARY ANGIOPLASTY WITH STENT PLACEMENT  06/2007   TAXUS stent to RCA/notes 01/31/2011   . ESOPHAGOGASTRODUODENOSCOPY  09/28/2011   Procedure: ESOPHAGOGASTRODUODENOSCOPY (EGD);  Surgeon: Missy Sabins, MD;  Location[facility] ENDOSCOPY;  Service: Endoscopy;  Laterality: N/A;  . ESOPHAGOGASTRODUODENOSCOPY N/A 04/07/2015   Procedure: ESOPHAGOGASTRODUODENOSCOPY (EGD);  Surgeon: Teena Irani, MD;  Location: [facility] END[facility];  Service: Endoscopy;  Laterality: N/A;  . ESOPHAGOGASTRODUODENOSCOPY N/A 04/19/2015   Procedure: ESOPHAGOGASTRODUODENOSCOPY (EGD);  Surgeon: Arta Silence, MD;  Location[facility] ENDOSCOPY;  Service: Endoscopy;  Laterality: N/A;  . FOREIGN BODY REMOVAL  09/2003   via upper endoscopy/notes 02/12/2011  . GIVENS CAPSULE STUDY  09/30/2011   Procedure: GIVENS CAPSULE STUDY;  Surgeon: Jeryl Columbia, MD;  Location: [facility] ENDOSCOPY;  Service: Endoscopy;  Laterality: N/A;  . INSERTION OF DIALYSIS CATHETER Right 2014  . INSERTION OF DIALYSIS CATHETER Left 02/11/2013   Procedure: INSERTION OF DIALYSIS CATHETER;  Surgeon: Conrad Ephrata, MD;  Location: [place];  Service: Vascular;  Laterality: Left;  Ultrasound guided  . REMOVAL OF A DIALYSIS CATHETER Right 02/11/2013   Procedure: REMOVAL OF A DIALYSIS CATHETER;  Surgeon: Conrad [place], MD;  Location: [place];  Service: Vascular;  Laterality: Right;  . SAVORY DILATION N/A 04/07/2015   Procedure: SAVORY DILATION;  Surgeon: Teena Irani, MD;  Location: [facility] ENDOSCOPY;  Service: Endoscopy;  Laterality: N/A;  . SHUNTOGRAM N/A 09/20/2011   Procedure: Earney Mallet;  Surgeon: Conrad [place], MD;  Location: [facility] CATH LAB;  Service: Cardiovascular;  Laterality: N/A;  . SVT ABLATION N/A 11/26/2016   Procedure: SVT Ablation;  Surgeon: Will Meredith Leeds, MD;  Location: [place] CV LAB;  Service: Cardiovascular;  Laterality: N/A;  . TONSILLECTOMY    . TOTAL KNEE ARTHROPLASTY Right 08/02/2015   Procedure: TOTAL KNEE ARTHROPLASTY;  Surgeon: Renette Butters, MD;  Location: [place];  Service: Orthopedics;  Laterality: Right;  . VENOGRAM N/A 01/26/2013   Procedure: VENOGRAM;   Surgeon: Angelia Mould, MD;  Location: [facility] CATH LAB;  Service: Cardiovascular;  Laterality: N/A;    Family History: Family History  Problem Relation Age of Onset  . Heart disease Sister   . Thyroid nodules Sister   . Heart disease Father   . Diabetes Father   . Kidney failure Father   . Hypertension Father   . Healthy Child   . Healthy Child   . Healthy Child   . Cancer Neg Hx     Social History: Social History   Social History  .  Marital status: Married    Spouse name: N/A  . Number of children: 3  . Years of education: N/A   Occupational History  . 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    Social History Main Topics  . Smoking status: Former Smoker    Packs/day: 1.00    Years: 25.00    Types: Cigarettes    Quit date: 10/01/2005  . Smokeless tobacco: Former Systems developer    Quit date: 10/01/2005     Comment: Quit smoking 2007 Smoked x 25 years 1/2 ppd.  . Alcohol use No  . Drug use: No  . Sexual activity: No   Other Topics Concern  . None   Social History Narrative  . None    Allergies:  Allergies  Allergen Reactions  . Cephalexin Swelling and Other (See Comments)    Tongue swelling  . Statins Other (See Comments)    Weak muscles  . Ciprofloxacin Rash    Objective:    Vital Signs:   Temp:  [98 F (36.7 C)-98.5 F (36.9 C)] 98 F (36.7 C) (03/29 0821) Pulse Rate:  [77-98] 87 (03/29 0821) Resp:  [13-19] 18 (03/29 0821) BP: (110-175)/(60-97) 126/66 (03/29 0821) SpO2:  [94 %-100 %] 100 % (03/29 0821) Weight:  [233 lb 6.4 oz (105.9 kg)] 233 lb 6.4 oz (105.9 kg) (03/29 0248) Last BM Date: 12/26/16  Weight change: Filed Weights   12/27/16 0248  Weight: 233 lb 6.4 oz (105.9 kg)    Intake/Output:   Intake/Output Summary (Last 24 hours) at 12/27/16 1039 Last data filed at 12/27/16 1028  Gross per 24 hour  Intake              580 ml  Output                0 ml  Net              580 ml     Physical Exam: General:   Elderly. Fatigued. Lying in bed HEENT: normal anicteric  Neck: Supple. JVP 5-6 cm. Carotids 2+ bilat; no bruits. No thyromegaly or nodule noted. Cor: PMI nondisplaced. Regular rate & rhythm.  2/6 SEM LUSB no rub Lungs: Clear no wheezing  Abdomen: obese. soft, nontender, nondistended. No hepatosplenomegaly. No bruits or masses. Good bowel sounds. Extremities: no cyanosis, clubbing, rash. Warm no edema  Neuro: alert & orientedx3, cranial nerves grossly intact. moves all 4 extremities w/o difficulty. Affect pleasant  Telemetry: NSR 80-90s Personally reviewed   Labs: Basic Metabolic Panel:  Recent Labs Lab 12/26/16 2020 12/27/16 0420  NA 139 140  K 4.4 4.2  CL 97* 98*  CO2 24 24  GLUCOSE 131* 144*  BUN 37* 41*  CREATININE 9.37* 10.42*  CALCIUM 9.2 9.2    Liver Function Tests: No results for input(s): AST, ALT, ALKPHOS, BILITOT, PROT, ALBUMIN in the last 168 hours. No results for input(s): LIPASE, AMYLASE in the last 168 hours. No results for input(s): AMMONIA in the last 168 hours.  CBC:  Recent Labs Lab 12/26/16 2020 12/27/16 0420  WBC 8.0 6.1  HGB 10.7* 10.6*  HCT 34.7* 34.2*  MCV 90.8 90.5  PLT 315 315    Cardiac Enzymes:  Recent Labs Lab 12/27/16 0420  TROPONINI 0.03*    BNP: BNP (last 3 results) No results for input(s): BNP in the last 8760 hours.  ProBNP (last 3 results) No results for input(s): PROBNP in the last 8760 hours.   CBG:  Recent Labs Lab  12/26/16 2149 12/27/16 0307 12/27/16 0741  GLUCAP 120* 149* 128*    Coagulation Studies:  Recent Labs  12/27/16 0420  LABPROT 13.4  INR 1.02    Other results: EKG: Sinus rhythm with PACs No ST-T wave abnormalities.    Imaging: Dg Chest 2 View  Result Date: 12/26/2016 CLINICAL DATA:  Acute onset of generalized fatigue. Initial encounter. EXAM: CHEST  2 VIEW COMPARISON:  Chest radiograph performed 10/16/2016 FINDINGS: The lungs are well-aerated. Vascular congestion is noted.  Increased interstitial markings may reflect mild interstitial edema. There is no evidence of pleural effusion or pneumothorax. The heart is borderline normal in size. No acute osseous abnormalities are seen. A vascular stent is noted overlying the right axilla. Cervical spinal fusion hardware is partially imaged. IMPRESSION: Vascular congestion. Increased interstitial markings may reflect mild interstitial edema. Electronically Signed   By: Garald Balding M.D.   On: 12/26/2016 21:17      Medications:     Current Medications: . allopurinol  100 mg Oral Daily  . aspirin  81 mg Oral Daily  . cinacalcet  90 mg Oral BID WC  . [START ON 12/28/2016] citalopram  10 mg Oral Once per day on Mon Wed Fri  . clopidogrel  75 mg Oral Q breakfast  . colesevelam  1,875 mg Oral BID WC  . ezetimibe  10 mg Oral Daily  . gabapentin  300 mg Oral QHS  . heparin  5,000 Units Subcutaneous Q8H  . insulin aspart  0-9 Units Subcutaneous TID WC  . lanthanum  3,000 mg Oral BID WC  . methimazole  5 mg Oral Daily  . [START ON 12/28/2016] metoprolol tartrate  25 mg Oral 2 times per day on Sun Mon Wed Fri  . midodrine  10 mg Oral Q T,Th,S,Su  . multivitamin  1 tablet Oral QHS  . pantoprazole  40 mg Oral BID     Infusions:    Assessment/Plan   1. Fatigue 2. Chronic systolic CHF - Echo 01/16/39 LVEF 40-45%, Grade 2 DD. Trivial MR, Mild LAE, Normal RV. 3. CAD s/p PCI LAD 08/2016 4. ESRD - HD Tues/Thur/Sat 5. 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 Initial troponin I 0.03.  Will cycle troponins. Low suspicion with lack of ACS symptoms.  Had recent NSTEMI in 1/18 due to SVT.  Previous cath with 80-90% long lesion in RCA but not amenable to PCI due to tortuosity.   No arrythmia noted on Tele so far, but would consider involving EP if does occur with recent AVNRT ablation.  Will check Mg.  D-Dimer 2.61. No chest pain, no tachycardia, No S1Q3T3 on EKG.  Low suspicion  for PE. Could consider CTA for PE if needed as he is dialysis.  Pt is due for dialysis. Renal following. To get this pm.   Most recent Echo 10/2016 with LVEF 40-45%. Pt does not appear to be in acute HF. Volume status stable and management per renal.   Length of Stay: 0  Shirley Friar, PA-C  12/27/2016, 10:39 AM  Advanced Heart Failure Team Pager [phone] (M-F; 7a - 4p)  Please contact [facility] for night-coverage after hours (4p -7a ) and weekends on [email or web address]  Patient seen and examined with Oda Kilts, PA-C. We discussed all aspects of the encounter. I agree with the assessment and plan as stated above.   Patient with transient CP yesterday. Outside troponin minimally elevated. Troponin here is normal. Currently CP  free. ECG is fine. No rub on exam.   I think he is stable from a cardiac standpoint. He does have significant CAD in RCA but this has been well managed meically and no evidence of ACS currently.   Can d/c home in am from our standpoint.   We will sign off. Please call with questions.   Glori Bickers, MD  5:30 PM

## 2016-12-27 NOTE — Progress Notes (Signed)
Patient with no complains or concerns after arrival. Will continue to monitor.  Kynesha Guerin, RN

## 2016-12-27 NOTE — ED Notes (Signed)
Arterial stick completed by RT; only enough blood for I-stat troponin

## 2016-12-27 NOTE — ED Provider Notes (Signed)
[facility] troponin I 0.02 hospitals, has been contacted for admission   Junius Creamer, NP 12/27/16 [place], MD 12/27/16 [phone]

## 2016-12-27 NOTE — Progress Notes (Signed)
Patient arrived to unit per bed.  Reviewed treatment plan and this RN agrees.  Report received from bedside RN, Lamelva.  Consent obtained.  Patient A & O X 4. Lung sounds diminished to ausculation in all fields. Generalized edema. Cardiac: HB.  Prepped RUAVF with alcohol and cannulated with two 15 gauge needles.  Pulsation of blood noted.  Flushed access well with saline per protocol.  Connected and secured lines and initiated tx at 1514.  UF goal of 2500 mL and net fluid removal of 2000 mL.  Will continue to monitor.  Patient is extremely difficult stick, 4 RN's attempted.

## 2016-12-27 NOTE — ED Notes (Signed)
Continue to be unable to obtain blood from pt.  IV team paged for access and blood draw.

## 2016-12-27 NOTE — H&P (Signed)
History and Physical    [name] PZW:[id number] DOB: 01-Aug-1951 DOA: 12/26/2016  PCP: [name], MD  Patient coming from: Home.  Chief Complaint: Elevated troponin.  HPI: [name] is a 66 y.o. male with history of ESRD on hemodialysis on Tuesday Thursday since Saturday, CAD status post stenting in November 2017, SVT status post ablation last month had followed up with his cardiologist yesterday. During which patient states he had an episode of shortness of breath last week which woke him up from sleep and since then patient has been feeling fatigued. Denies any chest pain nausea vomiting or diarrhea. Has been having some right wrist pain probably from gout for which patient got cortisone shots. Patient's cardiologist and order troponin T which came out positive and was told to come to the ER.   ED Course: Chest x-ray shows congestion. EKG were unremarkable. I-STAT troponin was mildly elevated. Patient is chest pain-free. Patient is being admitted for further observation and cycle cardiac markers.  Review of Systems: As per HPI, rest all negative.   Past Medical History:  Diagnosis Date  . Allergic rhinitis, cause unspecified 02/24/2014  . Anemia 06/16/2011  . BENIGN PROSTATIC HYPERTROPHY 10/14/2009  . CAD, NATIVE VESSEL 02/06/2009   saw Dr. Missy Sabins last jan 2013  . Cervical radiculopathy, chronic 02/23/2016   Right c5-6 by NCS/EMG  . CHEST PAIN 03/29/2010  . COLONIC POLYPS, HX OF 10/14/2009  . CONGESTIVE HEART FAILURE 06/18/2007  . Dementia [id number]  . DEPRESSION 10/14/2009  . Depression 09/24/2015  . DIABETES MELLITUS, TYPE II 02/01/2010  . DIASTOLIC HEART FAILURE, CHRONIC 02/06/2009  . DIZZINESS 07/17/2010  . DYSLIPIDEMIA 06/18/2007  . DYSPNEA 10/29/2008  . ESRD (end stage renal disease) on dialysis ([place]) 08/04/2010   "TTS;  " (04/18/2015)  . FOOT PAIN 08/12/2008  . GAIT DISTURBANCE 03/03/2010  . GASTROENTERITIS, VIRAL 10/14/2009  . GERD 06/18/2007  . GOITER, MULTINODULAR  12/26/2007  . GOUT 06/18/2007  . GYNECOMASTIA 07/17/2010  . Hemodialysis access, fistula mature [facility])    Dialysis T-Th-Sa ([place]) Right upper arm fistula  . Hyperlipidemia 10/16/2011  . Hyperparathyroidism, secondary ([place]) 06/16/2011  . HYPERTENSION 06/18/2007  . Hyperthyroidism   . HYPERTHYROIDISM 02/02/2010  . Hypocalcemia 06/07/2010  . Ischemic cardiomyopathy 06/16/2011  . NECK PAIN 07/31/2010  . NSTEMI (non-ST elevated myocardial infarction) ([place]) 08/04/2016  . ONYCHOMYCOSIS, TOENAILS 12/26/2007  . OSA on CPAP 10/16/2011  . Other malaise and fatigue 11/24/2009  . PERIPHERAL NEUROPATHY 06/18/2007  . Prostate cancer ([place])   . PULMONARY NODULE, RIGHT LOWER LOBE 06/08/2009  . Renal insufficiency   . Sleep apnea    cpap machine and o2  . TRANSAMINASES, SERUM, ELEVATED 02/01/2010  . Transfusion history    none recent  . Unspecified hypotension 01/30/2010    Past Surgical History:  Procedure Laterality Date  . ARTERIOVENOUS GRAFT PLACEMENT Right 2009   forearm/notes 02/01/2011  . AV FISTULA PLACEMENT  11/07/2011   Procedure: INSERTION OF ARTERIOVENOUS (AV) GORE-TEX GRAFT ARM;  Surgeon: Tinnie Gens, MD;  Location: [place];  Service: Vascular;  Laterality: Left;  . BACK SURGERY  1998  . BASCILIC VEIN TRANSPOSITION Right 02/27/2013   Procedure: BASCILIC VEIN TRANSPOSITION;  Surgeon: Mal Misty, MD;  Location: [place];  Service: Vascular;  Laterality: Right;  Right Basilic Vein Transposition   . CARDIAC CATHETERIZATION N/A 08/06/2016   Procedure: Left Heart Cath and Coronary Angiography;  Surgeon: Jolaine Artist, MD;  Location: [place] CV LAB;  Service:  Cardiovascular;  Laterality: N/A;  . CARDIAC CATHETERIZATION N/A 08/07/2016   Procedure: Coronary/Graft Atherectomy-CSI LAD;  Surgeon: Peter M Martinique, MD;  Location: [place] CV LAB;  Service: Cardiovascular;  Laterality: N/A;  . CERVICAL SPINE SURGERY  2/09   "to repair nerve problems in my left arm"  . CHOLECYSTECTOMY      . CORONARY ANGIOPLASTY WITH STENT PLACEMENT  06/11/2008  . CORONARY ANGIOPLASTY WITH STENT PLACEMENT  06/2007   TAXUS stent to RCA/notes 01/31/2011  . ESOPHAGOGASTRODUODENOSCOPY  09/28/2011   Procedure: ESOPHAGOGASTRODUODENOSCOPY (EGD);  Surgeon: Missy Sabins, MD;  Location[facility] ENDOSCOPY;  Service: Endoscopy;  Laterality: N/A;  . ESOPHAGOGASTRODUODENOSCOPY N/A 04/07/2015   Procedure: ESOPHAGOGASTRODUODENOSCOPY (EGD);  Surgeon: Teena Irani, MD;  Location: [facility] ENDOSCOPY;  Service: Endoscopy;  Laterality: N/A;  . ESOPHAGOGASTRODUODENOSCOPY N/A 04/19/2015   Procedure: ESOPHAGOGASTRODUODENOSCOPY (EGD);  Surgeon: Arta Silence, MD;  Location: [facility] ENDOSCOPY;  Service: Endoscopy;  Laterality: N/A;  . FOREIGN BODY REMOVAL  09/2003   via upper endoscopy/notes 02/12/2011  . GIVENS CAPSULE STUDY  09/30/2011   Procedure: GIVENS CAPSULE STUDY;  Surgeon: Jeryl Columbia, MD;  Location: [facility] ENDOSCOPY;  Service: Endoscopy;  Laterality: N/A;  . INSERTION OF DIALYSIS CATHETER Right 2014  . INSERTION OF DIALYSIS CATHETER Left 02/11/2013   Procedure: INSERTION OF DIALYSIS CATHETER;  Surgeon: Conrad [place]ville, MD;  Location: [place];  Service: Vascular;  Laterality: Left;  Ultrasound guided  . REMOVAL OF A DIALYSIS CATHETER Right 02/11/2013   Procedure: REMOVAL OF A DIALYSIS CATHETER;  Surgeon: Conrad [place], MD;  Location: [place];  Service: Vascular;  Laterality: Right;  . SAVORY DILATION N/A 04/07/2015   Procedure: SAVORY DILATION;  Surgeon: Teena Irani, MD;  Location: [facility] ENDOSCOPY;  Service: Endoscopy;  Laterality: N/A;  . SHUNTOGRAM N/A 09/20/2011   Procedure: Earney Mallet;  Surgeon: Conrad [place], MD;  Location: [facility] CATH LAB;  Service: Cardiovascular;  Laterality: N/A;  . SVT ABLATION N/A 11/26/2016   Procedure: SVT Ablation;  Surgeon: Will Meredith Leeds, MD;  Location: [place] CV LAB;  Service: Cardiovascular;  Laterality: N/A;  . TONSILLECTOMY    . TOTAL KNEE ARTHROPLASTY Right 08/02/2015   Procedure: TOTAL KNEE ARTHROPLASTY;   Surgeon: Renette Butters, MD;  Location: [place];  Service: Orthopedics;  Laterality: Right;  . VENOGRAM N/A 01/26/2013   Procedure: VENOGRAM;  Surgeon: Angelia Mould, MD;  Location: [facility] CATH LAB;  Service: Cardiovascular;  Laterality: N/A;     reports that he quit smoking about 11 years ago. His smoking use included Cigarettes. He has a 25.00 pack-year smoking history. He quit smokeless tobacco use about 11 years ago. He reports that he does not drink alcohol or use drugs.  Allergies  Allergen Reactions  . Cephalexin Swelling and Other (See Comments)    Tongue swelling  . Statins Other (See Comments)    Weak muscles  . Ciprofloxacin Rash    Family History  Problem Relation Age of Onset  . Heart disease Sister   . Thyroid nodules Sister   . Heart disease Father   . Diabetes Father   . Kidney failure Father   . Hypertension Father   . Healthy Child   . Healthy Child   . Healthy Child   . Cancer Neg Hx     Prior to Admission medications   Medication Sig Start Date End Date Taking? Authorizing Provider  acetaminophen (TYLENOL) 325 MG tablet Take 650 mg by mouth every 6 (six) hours as needed for mild pain  or headache.    Yes Historical Provider, MD  allopurinol (ZYLOPRIM) 100 MG tablet Take 1 tablet (100 mg total) by mouth daily. 07/20/16  Yes Biagio Borg, MD  aspirin 81 MG tablet Take 81 mg by mouth daily.   Yes Historical Provider, MD  citalopram (CELEXA) 10 MG tablet Take 10 mg by mouth 3 (three) times a week.   Yes Historical Provider, MD  clopidogrel (PLAVIX) 75 MG tablet Take 1 tablet (75 mg total) by mouth daily with breakfast. 09/05/16  Yes Jolaine Artist, MD  colesevelam [facility]) 625 MG tablet TAKE THREE TABLETS BY MOUTH TWICE DAILY WITH MEALS 07/20/16  Yes Biagio Borg, MD  ezetimibe (ZETIA) 10 MG tablet Take 1 tablet (10 mg total) by mouth daily. 03/15/16  Yes Jolaine Artist, MD  gabapentin (NEURONTIN) 300 MG capsule 1-2 tabs by mouth at bedtime for sleep  and pain 07/20/16  Yes Biagio Borg, MD  HYDROcodone-acetaminophen (NORCO/VICODIN) 5-325 MG tablet Take 1 tablet by mouth every 6 (six) hours as needed for moderate pain.   Yes Historical Provider, MD  Lanthanum Carbonate 1000 MG PACK Take 3 packets by mouth 2 (two) times daily.    Yes Historical Provider, MD  methimazole (TAPAZOLE) 5 MG tablet Take 1 tablet (5 mg total) by mouth daily. 10/24/16  Yes Renato Shin, MD  midodrine (PROAMATINE) 10 MG tablet Take 1 tablet (10 mg total) by mouth See admin instructions. Once a day only on dialysis days (Tues/Thurs/Sat) 10/18/16  Yes Belkys A Regalado, MD  multivitamin (RENA-VIT) TABS tablet Take 1 tablet by mouth at bedtime. Reported on 10/24/2015   Yes Historical Provider, MD  pantoprazole (PROTONIX) 40 MG tablet Take 40 mg by mouth 2 (two) times daily.  08/31/16  Yes Historical Provider, MD  SENSIPAR 90 MG tablet Take 90 mg by mouth 2 (two) times daily. 09/18/16  Yes Historical Provider, MD  metoprolol tartrate (LOPRESSOR) 25 MG tablet Take 1 tablet (25 mg total) by mouth 2 (two) times daily. ON NON-HD DAYS Patient not taking: Reported on 12/26/2016 12/26/16   Will Meredith Leeds, MD    Physical Exam: Vitals:   12/27/16 0030 12/27/16 0130 12/27/16 0230 12/27/16 0248  BP: (!) 146/97 132/72 139/66 (!) 175/85  Pulse: 88 77 98 89  Resp: 15 14 19 18   Temp:    98.5 F (36.9 C)  TempSrc:    Oral  SpO2: 97% 97% 94% 96%  Weight:    105.9 kg (233 lb 6.4 oz)  Height:    6\' 1"  (1.854 m)      Constitutional: Moderately built and nourished. Vitals:   12/27/16 0030 12/27/16 0130 12/27/16 0230 12/27/16 0248  BP: (!) 146/97 132/72 139/66 (!) 175/85  Pulse: 88 77 98 89  Resp: 15 14 19 18   Temp:    98.5 F (36.9 C)  TempSrc:    Oral  SpO2: 97% 97% 94% 96%  Weight:    105.9 kg (233 lb 6.4 oz)  Height:    6\' 1"  (1.854 m)   Eyes: Anicteric no pallor. ENMT: No discharge from the ears eyes nose or mouth. Neck: No mass felt. No JVD appreciated. Respiratory:  No rhonchi or crepitations. Cardiovascular: S1 and S2 heard no murmurs appreciated. Abdomen: Soft nontender bowel sounds present. Musculoskeletal: No edema. Right hand is in the brace. Skin: No rash. Skin appears warm. Neurologic: Alert awake oriented to time place and person. Moves all extremities. Psychiatric: Appears normal. Normal affect.   Labs on Admission:  I have personally reviewed following labs and imaging studies  CBC:  Recent Labs Lab 12/26/16 2020  WBC 8.0  HGB 10.7*  HCT 34.7*  MCV 90.8  PLT 591   Basic Metabolic Panel:  Recent Labs Lab 12/26/16 2020  NA 139  K 4.4  CL 97*  CO2 24  GLUCOSE 131*  BUN 37*  CREATININE 9.37*  CALCIUM 9.2   GFR: Estimated Creatinine Clearance: 9.8 mL/min (A) (by C-G formula based on SCr of 9.37 mg/dL (H)). Liver Function Tests: No results for input(s): AST, ALT, ALKPHOS, BILITOT, PROT, ALBUMIN in the last 168 hours. No results for input(s): LIPASE, AMYLASE in the last 168 hours. No results for input(s): AMMONIA in the last 168 hours. Coagulation Profile: No results for input(s): INR, PROTIME in the last 168 hours. Cardiac Enzymes: No results for input(s): CKTOTAL, CKMB, CKMBINDEX, TROPONINI in the last 168 hours. BNP (last 3 results) No results for input(s): PROBNP in the last 8760 hours. HbA1C: No results for input(s): HGBA1C in the last 72 hours. CBG:  Recent Labs Lab 12/26/16 2149 12/27/16 0307  GLUCAP 120* 149*   Lipid Profile: No results for input(s): CHOL, HDL, LDLCALC, TRIG, CHOLHDL, LDLDIRECT in the last 72 hours. Thyroid Function Tests: No results for input(s): TSH, T4TOTAL, FREET4, T3FREE, THYROIDAB in the last 72 hours. Anemia Panel: No results for input(s): VITAMINB12, FOLATE, FERRITIN, TIBC, IRON, RETICCTPCT in the last 72 hours. Urine analysis:    Component Value Date/Time   COLORURINE LT. YELLOW 06/26/2013 1200   APPEARANCEUR CLEAR 06/26/2013 1200   LABSPEC 1.020 06/26/2013 1200   PHURINE  8.5 06/26/2013 1200   GLUCOSEU 100 06/26/2013 1200   HGBUR MODERATE 06/26/2013 1200   BILIRUBINUR NEGATIVE 06/26/2013 1200   KETONESUR NEGATIVE 06/26/2013 1200   PROTEINUR >300 (A) 01/04/2010 1032   UROBILINOGEN 0.2 06/26/2013 1200   NITRITE NEGATIVE 06/26/2013 1200   LEUKOCYTESUR TRACE 06/26/2013 1200   Sepsis Labs: @LABRCNTIP (procalcitonin:4,lacticidven:4) )No results found for this or any previous visit (from the past 240 hour(s)).   Radiological Exams on Admission: Dg Chest 2 View  Result Date: 12/26/2016 CLINICAL DATA:  Acute onset of generalized fatigue. Initial encounter. EXAM: CHEST  2 VIEW COMPARISON:  Chest radiograph performed 10/16/2016 FINDINGS: The lungs are well-aerated. Vascular congestion is noted. Increased interstitial markings may reflect mild interstitial edema. There is no evidence of pleural effusion or pneumothorax. The heart is borderline normal in size. No acute osseous abnormalities are seen. A vascular stent is noted overlying the right axilla. Cervical spinal fusion hardware is partially imaged. IMPRESSION: Vascular congestion. Increased interstitial markings may reflect mild interstitial edema. Electronically Signed   By: Garald Balding M.D.   On: 12/26/2016 21:17    EKG: Independently reviewed. Normal sinus rhythm.  Assessment/Plan Principal Problem:   Weakness Active Problems:   Gout   OSA (obstructive sleep apnea)   Hyperlipidemia   Status post coronary artery stent placement   ESRD on hemodialysis ([name])   Generalized weakness    1. Generalized weakness with elevated troponin - given history of CAD status post stenting in November 2017 we will cycle cardiac markers check d-dimer and TSH. 2. ESRD on hemodialysis on Tuesday Thursdays and Saturday. Patient is due for dialysis this morning. Consult nephrologist. 3. Chronic systolic heart failure last EF measured was 40-45% - fluid management by nephrologist. 4. Hyperthyroidism - check  TSH. 5. Diabetes mellitus type 2 not on any medications. Follow CBGs with sliding scale coverage. 6. OSA on CPAP. 7. History of gout.  8. History of CAD status post stenting - on aspirin and Plavix beta blockers and Zetia and WelChol. Patient is allergic to statin.   DVT prophylaxis: Heparin. Code Status: Full code.  Family Communication: Discussed with patient.  Disposition Plan: Home.  Consults called: None.  Admission status: Observation.    Rise Patience MD [facility] Pager [phone].  If 7PM-7AM, please contact night-coverage [email or web address] Password [email or web address]  12/27/2016, 3:30 AM

## 2016-12-27 NOTE — Progress Notes (Signed)
Pt is alert and oriented alert, Paged Md about Patient concern.

## 2016-12-27 NOTE — Progress Notes (Signed)
Md Aware of pt elvevated D-Dimer refused Hep VTE,

## 2016-12-27 NOTE — Progress Notes (Signed)
Pt is alert and oriented Reported to H/d Nurse and transportation on the way.

## 2016-12-27 NOTE — Progress Notes (Signed)
New Admission Note:   Arrival Method: Bed Mental Orientation: Alert and oriented x4 Telemetry:3E Box 3 Assessment: Completed Skin: Intact/ Right upper arm AV fistula  IV: left arm 22 G Pain:0/10 Tubes: None Safety Measures: Safety Fall Prevention Plan has been discussed  Admission:  [place] Orientation: Patient has been orientated to the room, unit and staff.  Family: None at bedside  Orders to be reviewed and implemented. Will continue to monitor the patient. Call light has been placed within reach and bed alarm has been de-activated. Patient refused bed alarm.   Venetia Night, RN Phone: [phone]

## 2016-12-27 NOTE — Progress Notes (Signed)
Dialysis treatment completed.  981 mL ultrafiltrated and net fluid removal 481 mL.    Patient status unchanged. Lung sounds diminished to ausculation in all fields. Generalized edema. Cardiac: NSr.  Disconnected lines and removed needles.  Pressure held for 10 minutes and band aid/gauze dressing applied.  Report given to bedside RN, Lamelva.

## 2016-12-27 NOTE — Progress Notes (Addendum)
Gabapentin 300 mg ordered at bedtime and given. Wife states that patient takes 600 mg at bedtime. Paged MD on call for addition dose. Verbal order with read back given for another 300 mg per MD [place] A.    Angelus Hoopes, RN

## 2016-12-27 NOTE — Progress Notes (Signed)
Troponin this morning 0.03. Will continue to monitor.  Macey Wurtz, RN

## 2016-12-27 NOTE — Consult Note (Deleted)
Advanced Heart Failure Team Consult Note  Referring Physician: Doyle Askew Primary Physician: Dr. [name] Primary Cardiologist:  Dr. [name]  EP: Curt Bears Nephrologist: Dr. 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  Pt s/p AVNRT ablation on 11/26/16.   Labs drawn in Dr. Curt Bears office 3/28.18 with Tropinin T - 0.157.  Sent to ED for further evaluation.   Pertinent labs on admission include Creatinine 10.42, K 4.2, Hgb 10.6, Troponin I 0.03.   Pt states he has not had ANY chest pain. Pt states she he has felt more fatigued since ablation 11/26/16. Denies palpitations, chest pain, LE edema, lightheadedness, dizziness, or syncope/pre-syncope. States he woke from sleep a week ago very SOB. He states he had a strange dream and wonders if he had a panic attack. Symptoms quickly subsided without any intervention. Pt states he is SOB walking from one side of the room to the other, which is somewhat worse than his baseline.  Pt has not missed any dialysis and takes all medication as directed.   Echo 10/17/16 LVEF 40-45%, Grade 2 DD. Trivial MR, Mild LAE, Normal RV.  Review of Systems: [y] = yes, [ ]  = no   General: Weight gain [ ] ; Weight loss [ ] ; Anorexia [ ] ; Fatigue [y]; Fever [ ] ; Chills [ ] ; Weakness [ ]   Cardiac: Chest pain/pressure [ ] ; Resting SOB [ ] ; Exertional SOB [y]; Orthopnea [ ] ; Pedal Edema [ ] ; Palpitations [ ] ; Syncope [ ] ; Presyncope [ ] ; Paroxysmal nocturnal dyspnea[ ]   Pulmonary: Cough [ ] ; Wheezing[ ] ; Hemoptysis[ ] ; Sputum [ ] ; Snoring [ ]   GI: Vomiting[ ] ; Dysphagia[ ] ; Melena[ ] ; Hematochezia [ ] ; Heartburn[ ] ; Abdominal pain [ ] ; Constipation [  ]; Diarrhea [ ] ; BRBPR [ ]   GU: Hematuria[ ] ; Dysuria [ ] ; Nocturia[ ]   Vascular: Pain in legs with walking [ ] ; Pain in feet with lying flat [ ] ; Non-healing sores [ ] ; Stroke [ ] ; TIA [ ] ; Slurred speech [ ] ;  Neuro: Headaches[ ] ; Vertigo[ ] ; Seizures[ ] ; Paresthesias[ ] ;Blurred vision [ ] ; Diplopia [ ] ; Vision changes [ ]   Ortho/Skin: Arthritis [y]; Joint pain [y]; Muscle pain [ ] ; Joint swelling [ ] ; Back Pain [ ] ; Rash [ ]   Psych: Depression[ ] ; Anxiety[y]  Heme: Bleeding problems [ ] ; Clotting disorders [ ] ; Anemia [ ]   Endocrine: Diabetes [ ] ; Thyroid dysfunction[ ]   Home Medications Prior to Admission medications   Medication Sig Start Date End Date Taking? Authorizing Provider  acetaminophen (TYLENOL) 325 MG tablet Take 650 mg by mouth every 6 (six) hours as needed for mild pain or headache.    Yes Historical Provider, MD  allopurinol (ZYLOPRIM) 100 MG tablet Take 1 tablet (100 mg total) by mouth daily. 07/20/16  Yes Biagio Borg, MD  aspirin 81 MG tablet Take 81 mg by mouth daily.   Yes Historical Provider, MD  citalopram (CELEXA) 10 MG tablet Take 10 mg by mouth 3 (three) times a week.   Yes Historical Provider, MD  clopidogrel (PLAVIX) 75 MG tablet Take 1 tablet (75 mg total) by mouth daily with breakfast. 09/05/16  Yes Jolaine Artist, MD  colesevelam [facility]) 625  MG tablet TAKE THREE TABLETS BY MOUTH TWICE DAILY WITH MEALS 07/20/16  Yes Biagio Borg, MD  ezetimibe (ZETIA) 10 MG tablet Take 1 tablet (10 mg total) by mouth daily. 03/15/16  Yes Jolaine Artist, MD  gabapentin (NEURONTIN) 300 MG capsule 1-2 tabs by mouth at bedtime for sleep and pain 07/20/16  Yes Biagio Borg, MD  HYDROcodone-acetaminophen (NORCO/VICODIN) 5-325 MG tablet Take 1 tablet by mouth every 6 (six) hours as needed for moderate pain.   Yes Historical Provider, MD  Lanthanum Carbonate 1000 MG PACK Take 3 packets by mouth 2 (two) times daily.    Yes Historical Provider, MD  methimazole (TAPAZOLE) 5 MG  tablet Take 1 tablet (5 mg total) by mouth daily. 10/24/16  Yes Renato Shin, MD  midodrine (PROAMATINE) 10 MG tablet Take 1 tablet (10 mg total) by mouth See admin instructions. Once a day only on dialysis days (Tues/Thurs/Sat) 10/18/16  Yes Belkys A Regalado, MD  multivitamin (RENA-VIT) TABS tablet Take 1 tablet by mouth at bedtime. Reported on 10/24/2015   Yes Historical Provider, MD  pantoprazole (PROTONIX) 40 MG tablet Take 40 mg by mouth 2 (two) times daily.  08/31/16  Yes Historical Provider, MD  SENSIPAR 90 MG tablet Take 90 mg by mouth 2 (two) times daily. 09/18/16  Yes Historical Provider, MD  metoprolol tartrate (LOPRESSOR) 25 MG tablet Take 1 tablet (25 mg total) by mouth 2 (two) times daily. ON NON-HD DAYS Patient not taking: Reported on 12/26/2016 12/26/16   Will Meredith Leeds, MD    Past Medical History: Past Medical History:  Diagnosis Date  . Allergic rhinitis, cause unspecified 02/24/2014  . Anemia 06/16/2011  . BENIGN PROSTATIC HYPERTROPHY 10/14/2009  . CAD, NATIVE VESSEL 02/06/2009   saw Dr. Missy Sabins last jan 2013  . Cervical radiculopathy, chronic 02/23/2016   Right c5-6 by NCS/EMG  . CHEST PAIN 03/29/2010  . COLONIC POLYPS, HX OF 10/14/2009  . CONGESTIVE HEART FAILURE 06/18/2007  . Dementia [id number]  . DEPRESSION 10/14/2009  . Depression 09/24/2015  . DIABETES MELLITUS, TYPE II 02/01/2010  . DIASTOLIC HEART FAILURE, CHRONIC 02/06/2009  . DIZZINESS 07/17/2010  . DYSLIPIDEMIA 06/18/2007  . DYSPNEA 10/29/2008  . ESRD (end stage renal disease) on dialysis ([place]) 08/04/2010   "TTS;  " (04/18/2015)  . FOOT PAIN 08/12/2008  . GAIT DISTURBANCE 03/03/2010  . GASTROENTERITIS, VIRAL 10/14/2009  . GERD 06/18/2007  . GOITER, MULTINODULAR 12/26/2007  . GOUT 06/18/2007  . GYNECOMASTIA 07/17/2010  . Hemodialysis access, fistula mature [facility])    Dialysis T-Th-Sa ([place]) Right upper arm fistula  . Hyperlipidemia 10/16/2011  . Hyperparathyroidism, secondary ([place]) 06/16/2011  .  HYPERTENSION 06/18/2007  . Hyperthyroidism   . HYPERTHYROIDISM 02/02/2010  . Hypocalcemia 06/07/2010  . Ischemic cardiomyopathy 06/16/2011  . NECK PAIN 07/31/2010  . NSTEMI (non-ST elevated myocardial infarction) ([place]) 08/04/2016  . ONYCHOMYCOSIS, TOENAILS 12/26/2007  . OSA on CPAP 10/16/2011  . Other malaise and fatigue 11/24/2009  . PERIPHERAL NEUROPATHY 06/18/2007  . Prostate cancer ([place])   . PULMONARY NODULE, RIGHT LOWER LOBE 06/08/2009  . Renal insufficiency   . Sleep apnea    cpap machine and o2  . TRANSAMINASES, SERUM, ELEVATED 02/01/2010  . Transfusion history    none recent  . Unspecified hypotension 01/30/2010    Past Surgical History: Past Surgical History:  Procedure Laterality Date  . ARTERIOVENOUS GRAFT PLACEMENT Right 2009   forearm/notes 02/01/2011  . AV FISTULA PLACEMENT  11/07/2011   Procedure: INSERTION OF ARTERIOVENOUS (AV)  GORE-TEX GRAFT ARM;  Surgeon: Tinnie Gens, MD;  Location: [place];  Service: Vascular;  Laterality: Left;  . BACK SURGERY  1998  . BASCILIC VEIN TRANSPOSITION Right 02/27/2013   Procedure: BASCILIC VEIN TRANSPOSITION;  Surgeon: Mal Misty, MD;  Location: [place];  Service: Vascular;  Laterality: Right;  Right Basilic Vein Transposition   . CARDIAC CATHETERIZATION N/A 08/06/2016   Procedure: Left Heart Cath and Coronary Angiography;  Surgeon: Jolaine Artist, MD;  Location: [place] CV LAB;  Service: Cardiovascular;  Laterality: N/A;  . CARDIAC CATHETERIZATION N/A 08/07/2016   Procedure: Coronary/Graft Atherectomy-CSI LAD;  Surgeon: Peter M Martinique, MD;  Location: [place] CV LAB;  Service: Cardiovascular;  Laterality: N/A;  . CERVICAL SPINE SURGERY  2/09   "to repair nerve problems in my left arm"  . CHOLECYSTECTOMY    . CORONARY ANGIOPLASTY WITH STENT PLACEMENT  06/11/2008  . CORONARY ANGIOPLASTY WITH STENT PLACEMENT  06/2007   TAXUS stent to RCA/notes 01/31/2011  . ESOPHAGOGASTRODUODENOSCOPY  09/28/2011   Procedure: ESOPHAGOGASTRODUODENOSCOPY (EGD);   Surgeon: Missy Sabins, MD;  Location: [facility] ENDOSCOPY;  Service: Endoscopy;  Laterality: N/A;  . ESOPHAGOGASTRODUODENOSCOPY N/A 04/07/2015   Procedure: ESOPHAGOGASTRODUODENOSCOPY (EGD);  Surgeon: Teena Irani, MD;  Location: [facility];  Service: Endoscopy;  Laterality: N/A;  . ESOPHAGOGASTRODUODENOSCOPY N/A 04/19/2015   Procedure: ESOPHAGOGASTRODUODENOSCOPY (EGD);  Surgeon: Arta Silence, MD;  Location: [facility] ENDOSCOPY;  Service: Endoscopy;  Laterality: N/A;  . FOREIGN BODY REMOVAL  09/2003   via upper endoscopy/notes 02/12/2011  . GIVENS CAPSULE STUDY  09/30/2011   Procedure: GIVENS CAPSULE STUDY;  Surgeon: Jeryl Columbia, MD;  Location: [facility] ENDOSCOPY;  Service: Endoscopy;  Laterality: N/A;  . INSERTION OF DIALYSIS CATHETER Right 2014  . INSERTION OF DIALYSIS CATHETER Left 02/11/2013   Procedure: INSERTION OF DIALYSIS CATHETER;  Surgeon: Conrad Bruc[place] Mills, MD;  Location: [place];  Service: Vascular;  Laterality: Left;  Ultrasound guided  . REMOVAL OF A DIALYSIS CATHETER Right 02/11/2013   Procedure: REMOVAL OF A DIALYSIS CATHETER;  Surgeon: Conrad Middleport, MD;  Location: [place];  Service: Vascular;  Laterality: Right;  . SAVORY DILATION N/A 04/07/2015   Procedure: SAVORY DILATION;  Surgeon: Teena Irani, MD;  Location: [facility] ENDOSCOPY;  Service: Endoscopy;  Laterality: N/A;  . SHUNTOGRAM N/A 09/20/2011   Procedure: Earney Mallet;  Surgeon: Conrad , MD;  Location: [facility] CATH LAB;  Service: Cardiovascular;  Laterality: N/A;  . SVT ABLATION N/A 11/26/2016   Procedure: SVT Ablation;  Surgeon: Will Meredith Leeds, MD;  Location: [place] CV LAB;  Service: Cardiovascular;  Laterality: N/A;  . TONSILLECTOMY    . TOTAL KNEE ARTHROPLASTY Right 08/02/2015   Procedure: TOTAL KNEE ARTHROPLASTY;  Surgeon: Renette Butters, MD;  Location: [place];  Service: Orthopedics;  Laterality: Right;  . VENOGRAM N/A 01/26/2013   Procedure: VENOGRAM;  Surgeon: Angelia Mould, MD;  Location: [facility] CATH LAB;  Service: Cardiovascular;  Laterality:  N/A;    Family History: Family History  Problem Relation Age of Onset  . Heart disease Sister   . Thyroid nodules Sister   . Heart disease Father   . Diabetes Father   . Kidney failure Father   . Hypertension Father   . Healthy Child   . Healthy Child   . Healthy Child   . Cancer Neg Hx     Social History: Social History   Social History  . Marital status: Married    Spouse name: N/A  . Number of children:  3  . Years of education: N/A   Occupational History  . 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    Social History Main Topics  . Smoking status: Former Smoker    Packs/day: 1.00    Years: 25.00    Types: Cigarettes    Quit date: 10/01/2005  . Smokeless tobacco: Former Systems developer    Quit date: 10/01/2005     Comment: Quit smoking 2007 Smoked x 25 years 1/2 ppd.  . Alcohol use No  . Drug use: No  . Sexual activity: No   Other Topics Concern  . None   Social History Narrative  . None    Allergies:  Allergies  Allergen Reactions  . Cephalexin Swelling and Other (See Comments)    Tongue swelling  . Statins Other (See Comments)    Weak muscles  . Ciprofloxacin Rash    Objective:    Vital Signs:   Temp:  [98 F (36.7 C)-98.5 F (36.9 C)] 98.4 F (36.9 C) (03/29 1418) Pulse Rate:  [75-98] 89 (03/29 1514) Resp:  [13-19] 15 (03/29 1418) BP: (110-175)/(60-97) 170/89 (03/29 1514) SpO2:  [94 %-100 %] 96 % (03/29 1129) Weight:  [233 lb 6.4 oz (105.9 kg)-235 lb 0.2 oz (106.6 kg)] 235 lb 0.2 oz (106.6 kg) (03/29 1418) Last BM Date: 12/26/16  Weight change: Filed Weights   12/27/16 0248 12/27/16 1418  Weight: 233 lb 6.4 oz (105.9 kg) 235 lb 0.2 oz (106.6 kg)    Intake/Output:   Intake/Output Summary (Last 24 hours) at 12/27/16 1543 Last data filed at 12/27/16 1028  Gross per 24 hour  Intake              580 ml  Output                0 ml  Net              580 ml     Physical Exam: General:  Elderly. Fatigued.  HEENT:  normal Neck: Supple. JVP 6-7 cm. Carotids 2+ bilat; no bruits. No thyromegaly or nodule noted. Cor: PMI nondisplaced. Regular rate & rhythm. No rubs or gallops. 2/6 SEM LUSB Lungs: Clear Abdomen: soft, nontender, nondistended. No hepatosplenomegaly. No bruits or masses. Good bowel sounds. Extremities: no cyanosis, clubbing, rash, edema Neuro: alert & orientedx3, cranial nerves grossly intact. moves all 4 extremities w/o difficulty. Affect pleasant  Telemetry: Reviewed personally, NSR 80s  Labs: Basic Metabolic Panel:  Recent Labs Lab 12/26/16 2020 12/27/16 0420  NA 139 140  K 4.4 4.2  CL 97* 98*  CO2 24 24  GLUCOSE 131* 144*  BUN 37* 41*  CREATININE 9.37* 10.42*  CALCIUM 9.2 9.2    Liver Function Tests: No results for input(s): AST, ALT, ALKPHOS, BILITOT, PROT, ALBUMIN in the last 168 hours. No results for input(s): LIPASE, AMYLASE in the last 168 hours. No results for input(s): AMMONIA in the last 168 hours.  CBC:  Recent Labs Lab 12/26/16 2020 12/27/16 0420  WBC 8.0 6.1  HGB 10.7* 10.6*  HCT 34.7* 34.2*  MCV 90.8 90.5  PLT 315 315    Cardiac Enzymes:  Recent Labs Lab 12/27/16 0420  TROPONINI 0.03*    BNP: BNP (last 3 results) No results for input(s): BNP in the last 8760 hours.  ProBNP (last 3 results) No results for input(s): PROBNP in the last 8760 hours.   CBG:  Recent Labs Lab 12/26/16 2149 12/27/16 0307 12/27/16 0741 12/27/16 1132  GLUCAP 120* 149*  128* 142*    Coagulation Studies:  Recent Labs  12/27/16 0420  LABPROT 13.4  INR 1.02    Other results: EKG: Sinus rhythm with PACs  Imaging: Dg Chest 2 View  Result Date: 12/26/2016 CLINICAL DATA:  Acute onset of generalized fatigue. Initial encounter. EXAM: CHEST  2 VIEW COMPARISON:  Chest radiograph performed 10/16/2016 FINDINGS: The lungs are well-aerated. Vascular congestion is noted. Increased interstitial markings may reflect mild interstitial edema. There is no evidence  of pleural effusion or pneumothorax. The heart is borderline normal in size. No acute osseous abnormalities are seen. A vascular stent is noted overlying the right axilla. Cervical spinal fusion hardware is partially imaged. IMPRESSION: Vascular congestion. Increased interstitial markings may reflect mild interstitial edema. Electronically Signed   By: Garald Balding M.D.   On: 12/26/2016 21:17     Medications:     Current Medications: . allopurinol  100 mg Oral Daily  . aspirin  81 mg Oral Daily  . cinacalcet  90 mg Oral BID WC  . [START ON 12/28/2016] citalopram  10 mg Oral Once per day on Mon Wed Fri  . clopidogrel  75 mg Oral Q breakfast  . colesevelam  1,875 mg Oral BID WC  . [START ON 01/03/2017] darbepoetin (ARANESP) injection - DIALYSIS  100 mcg Intravenous Q Thu-HD  . [START ON 12/29/2016] doxercalciferol  6 mcg Intravenous Q T,Th,Sa-HD  . ezetimibe  10 mg Oral Daily  . [START ON 01/01/2017] ferric gluconate (FERRLECIT/NULECIT) IV  62.5 mg Intravenous Q Tue-HD  . gabapentin  300 mg Oral QHS  . heparin  5,000 Units Subcutaneous Q8H  . insulin aspart  0-9 Units Subcutaneous TID WC  . lanthanum  3,000 mg Oral BID WC  . methimazole  5 mg Oral Daily  . [START ON 12/28/2016] metoprolol tartrate  25 mg Oral 2 times per day on Sun Mon Wed Fri  . midodrine  10 mg Oral Q T,Th,S,Su  . multivitamin  1 tablet Oral QHS  . pantoprazole  40 mg Oral BID    Infusions:    Assessment/Plan   1. Fatigue 2. Chronic systolic CHF - Echo 06/23/29 LVEF 40-45%, Grade 2 DD. Trivial MR, Mild LAE, Normal RV. 3. CAD s/p PCI LAD 08/2016 4. ESRD - HD Tues/Thur/Sat 5. 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 Initial troponin I 0.03.  Will cycle troponins. But low suspicion with lack of ACS symptoms.  Had recent NSTEMI in 1/18 due to SVT.  Previous cath with 80-90% long lesion in RCA but not amenable to PCI due to tortuosity.   No arrythmia noted on Tele so  far, but would consider involving EP if does occur with recent AVNRT ablation.  Will check Mg.  D-Dimer 2.61. No chest pain, no tachycardia, No S1Q3T3 on EKG.  Low suspicion for PE. Could consider CTA for PE if needed as he is dialysis.  Pt due for dialysis today.  Renal following. Pt to get this afternoon.   Most recent Echo 10/2016 with LVEF 40-45%. Pt does not appear to be in acute HF. Volume status stable and management per renal.   Length of Stay: 0  Shirley Friar, PA-C  12/27/2016, 3:43 PM  Advanced Heart Failure Team Pager [phone] (M-F; 7a - 4p)  Please contact [facility] for night-coverage after hours (4p -7a ) and weekends on [email or web address]

## 2016-12-27 NOTE — ED Notes (Signed)
IV team unable to obtain blood from pt.  Phlebotomy notified of urgent need for labs

## 2016-12-27 NOTE — ED Notes (Signed)
Family updated about status of blood draw and delay.

## 2016-12-27 NOTE — Progress Notes (Signed)
pt is alert and oriented sitting on side of with no distress.

## 2016-12-27 NOTE — Consult Note (Signed)
[facility] Renal Consultation Note  Indication for Consultation:  Management of ESRD/hemodialysis; anemia, hypertension/volume and secondary hyperparathyroidism  HPI: [name] is a 66 y.o. male with ESRD 2/2 DM / HD start 08/2008 ( OP TTS [facility]), compliant with op HD ,IDDM Type 2 ,CAD / RCA PTA  stent 2008 ,[place] Card / Ischemic  CM Dr. [name] follows /mild TR/STEMI LAD PCI Stent 08/2016 ,HO Morbid Obesity /OSA  uses CPAP,HO Major Depression/Anxiety.   Yesterday went to see his Cardilogist  OP sp AVNRT s/p ablation 11/26/14 with OV Labs drawn in Dr. Curt Bears office 3/28.18 with Tropinin T - 0.157 and was told to come to the ER. He was placed in  OBSERVATION  With admit ho="shortness of breath last week which woke him up from sleep and since then patient has been feeling fatigued." He denies chest pain .  CXR yest showing =Vascular congestion. Increased interstitial markings may reflect mild interstitial edema. Currently not sob. Noted at OP kidney center post wts 106.3, 106.9 last 2 ts with EDW 106.5. He is on midodrine for BP support on HD , We are consulted for ESRD/ HD issues . Plan for hd today attempting uf as bp allows .      Past Medical History:  Diagnosis Date  . Allergic rhinitis, cause unspecified 02/24/2014  . Anemia 06/16/2011  . BENIGN PROSTATIC HYPERTROPHY 10/14/2009  . CAD, NATIVE VESSEL 02/06/2009   saw Dr. Missy Sabins last jan 2013  . Cervical radiculopathy, chronic 02/23/2016   Right c5-6 by NCS/EMG  . CHEST PAIN 03/29/2010  . COLONIC POLYPS, HX OF 10/14/2009  . CONGESTIVE HEART FAILURE 06/18/2007  . Dementia [id number]  . DEPRESSION 10/14/2009  . Depression 09/24/2015  . DIABETES MELLITUS, TYPE II 02/01/2010  . DIASTOLIC HEART FAILURE, CHRONIC 02/06/2009  . DIZZINESS 07/17/2010  . DYSLIPIDEMIA 06/18/2007  . DYSPNEA 10/29/2008  . ESRD (end stage renal disease) on dialysis ([place]) 08/04/2010   "TTS;  " (04/18/2015)  . FOOT PAIN 08/12/2008  . GAIT DISTURBANCE  03/03/2010  . GASTROENTERITIS, VIRAL 10/14/2009  . GERD 06/18/2007  . GOITER, MULTINODULAR 12/26/2007  . GOUT 06/18/2007  . GYNECOMASTIA 07/17/2010  . Hemodialysis access, fistula mature [facility])    Dialysis T-Th-Sa ([place]) Right upper arm fistula  . Hyperlipidemia 10/16/2011  . Hyperparathyroidism, secondary ([place]) 06/16/2011  . HYPERTENSION 06/18/2007  . Hyperthyroidism   . HYPERTHYROIDISM 02/02/2010  . Hypocalcemia 06/07/2010  . Ischemic cardiomyopathy 06/16/2011  . NECK PAIN 07/31/2010  . NSTEMI (non-ST elevated myocardial infarction) ([place]) 08/04/2016  . ONYCHOMYCOSIS, TOENAILS 12/26/2007  . OSA on CPAP 10/16/2011  . Other malaise and fatigue 11/24/2009  . PERIPHERAL NEUROPATHY 06/18/2007  . Prostate cancer ([place])   . PULMONARY NODULE, RIGHT LOWER LOBE 06/08/2009  . Renal insufficiency   . Sleep apnea    cpap machine and o2  . TRANSAMINASES, SERUM, ELEVATED 02/01/2010  . Transfusion history    none recent  . Unspecified hypotension 01/30/2010    Past Surgical History:  Procedure Laterality Date  . ARTERIOVENOUS GRAFT PLACEMENT Right 2009   forearm/notes 02/01/2011  . AV FISTULA PLACEMENT  11/07/2011   Procedure: INSERTION OF ARTERIOVENOUS (AV) GORE-TEX GRAFT ARM;  Surgeon: Tinnie Gens, MD;  Location: [place];  Service: Vascular;  Laterality: Left;  . BACK SURGERY  1998  . BASCILIC VEIN TRANSPOSITION Right 02/27/2013   Procedure: BASCILIC VEIN TRANSPOSITION;  Surgeon: Mal Misty, MD;  Location: [place];  Service: Vascular;  Laterality: Right;  Right Basilic Vein  Transposition   . CARDIAC CATHETERIZATION N/A 08/06/2016   Procedure: Left Heart Cath and Coronary Angiography;  Surgeon: Jolaine Artist, MD;  Location: [place] CV LAB;  Service: Cardiovascular;  Laterality: N/A;  . CARDIAC CATHETERIZATION N/A 08/07/2016   Procedure: Coronary/Graft Atherectomy-CSI LAD;  Surgeon: Peter M Martinique, MD;  Location: [place] CV LAB;  Service: Cardiovascular;  Laterality: N/A;  .  CERVICAL SPINE SURGERY  2/09   "to repair nerve problems in my left arm"  . CHOLECYSTECTOMY    . CORONARY ANGIOPLASTY WITH STENT PLACEMENT  06/11/2008  . CORONARY ANGIOPLASTY WITH STENT PLACEMENT  06/2007   TAXUS stent to RCA/notes 01/31/2011  . ESOPHAGOGASTRODUODENOSCOPY  09/28/2011   Procedure: ESOPHAGOGASTRODUODENOSCOPY (EGD);  Surgeon: Missy Sabins, MD;  Location: [facility] ENDOSCOPY;  Service: Endoscopy;  Laterality: N/A;  . ESOPHAGOGASTRODUODENOSCOPY N/A 04/07/2015   Procedure: ESOPHAGOGASTRODUODENOSCOPY (EGD);  Surgeon: Teena Irani, MD;  Location: [facility];  Service: Endoscopy;  Laterality: N/A;  . ESOPHAGOGASTRODUODENOSCOPY N/A 04/19/2015   Procedure: ESOPHAGOGASTRODUODENOSCOPY (EGD);  Surgeon: Arta Silence, MD;  Location: [facility] ENDOSCOPY;  Service: Endoscopy;  Laterality: N/A;  . FOREIGN BODY REMOVAL  09/2003   via upper endoscopy/notes 02/12/2011  . GIVENS CAPSULE STUDY  09/30/2011   Procedure: GIVENS CAPSULE STUDY;  Surgeon: Jeryl Columbia, MD;  Location: [facility] ENDOSCOPY;  Service: Endoscopy;  Laterality: N/A;  . INSERTION OF DIALYSIS CATHETER Right 2014  . INSERTION OF DIALYSIS CATHETER Left 02/11/2013   Procedure: INSERTION OF DIALYSIS CATHETER;  Surgeon: Conrad Lawtell, MD;  Location: [place];  Service: Vascular;  Laterality: Left;  Ultrasound guided  . REMOVAL OF A DIALYSIS CATHETER Right 02/11/2013   Procedure: REMOVAL OF A DIALYSIS CATHETER;  Surgeon: Conrad [place], MD;  Location: [place];  Service: Vascular;  Laterality: Right;  . SAVORY DILATION N/A 04/07/2015   Procedure: SAVORY DILATION;  Surgeon: Teena Irani, MD;  Location: [facility] ENDOSCOPY;  Service: Endoscopy;  Laterality: N/A;  . SHUNTOGRAM N/A 09/20/2011   Procedure: Earney Mallet;  Surgeon: Conrad Pinewood, MD;  Location: [facility] CATH LAB;  Service: Cardiovascular;  Laterality: N/A;  . SVT ABLATION N/A 11/26/2016   Procedure: SVT Ablation;  Surgeon: Will Meredith Leeds, MD;  Location: [place] CV LAB;  Service: Cardiovascular;  Laterality: N/A;  .  TONSILLECTOMY    . TOTAL KNEE ARTHROPLASTY Right 08/02/2015   Procedure: TOTAL KNEE ARTHROPLASTY;  Surgeon: Renette Butters, MD;  Location: [place];  Service: Orthopedics;  Laterality: Right;  . VENOGRAM N/A 01/26/2013   Procedure: VENOGRAM;  Surgeon: Angelia Mould, MD;  Location: [facility] CATH LAB;  Service: Cardiovascular;  Laterality: N/A;      Family History  Problem Relation Age of Onset  . Heart disease Sister   . Thyroid nodules Sister   . Heart disease Father   . Diabetes Father   . Kidney failure Father   . Hypertension Father   . Healthy Child   . Healthy Child   . Healthy Child   . Cancer Neg Hx       reports that he quit smoking about 11 years ago. His smoking use included Cigarettes. He has a 25.00 pack-year smoking history. He quit smokeless tobacco use about 11 years ago. He reports that he does not drink alcohol or use drugs.   Allergies  Allergen Reactions  . Cephalexin Swelling and Other (See Comments)    Tongue swelling  . Statins Other (See Comments)    Weak muscles  . Ciprofloxacin Rash    Prior  to Admission medications   Medication Sig Start Date End Date Taking? Authorizing Provider  acetaminophen (TYLENOL) 325 MG tablet Take 650 mg by mouth every 6 (six) hours as needed for mild pain or headache.    Yes Historical Provider, MD  allopurinol (ZYLOPRIM) 100 MG tablet Take 1 tablet (100 mg total) by mouth daily. 07/20/16  Yes Biagio Borg, MD  aspirin 81 MG tablet Take 81 mg by mouth daily.   Yes Historical Provider, MD  citalopram (CELEXA) 10 MG tablet Take 10 mg by mouth 3 (three) times a week.   Yes Historical Provider, MD  clopidogrel (PLAVIX) 75 MG tablet Take 1 tablet (75 mg total) by mouth daily with breakfast. 09/05/16  Yes Jolaine Artist, MD  colesevelam [facility]) 625 MG tablet TAKE THREE TABLETS BY MOUTH TWICE DAILY WITH MEALS 07/20/16  Yes Biagio Borg, MD  ezetimibe (ZETIA) 10 MG tablet Take 1 tablet (10 mg total) by mouth daily. 03/15/16  Yes  Jolaine Artist, MD  gabapentin (NEURONTIN) 300 MG capsule 1-2 tabs by mouth at bedtime for sleep and pain 07/20/16  Yes Biagio Borg, MD  HYDROcodone-acetaminophen (NORCO/VICODIN) 5-325 MG tablet Take 1 tablet by mouth every 6 (six) hours as needed for moderate pain.   Yes Historical Provider, MD  Lanthanum Carbonate 1000 MG PACK Take 3 packets by mouth 2 (two) times daily.    Yes Historical Provider, MD  methimazole (TAPAZOLE) 5 MG tablet Take 1 tablet (5 mg total) by mouth daily. 10/24/16  Yes Renato Shin, MD  midodrine (PROAMATINE) 10 MG tablet Take 1 tablet (10 mg total) by mouth See admin instructions. Once a day only on dialysis days (Tues/Thurs/Sat) 10/18/16  Yes Belkys A Regalado, MD  multivitamin (RENA-VIT) TABS tablet Take 1 tablet by mouth at bedtime. Reported on 10/24/2015   Yes Historical Provider, MD  pantoprazole (PROTONIX) 40 MG tablet Take 40 mg by mouth 2 (two) times daily.  08/31/16  Yes Historical Provider, MD  SENSIPAR 90 MG tablet Take 90 mg by mouth 2 (two) times daily. 09/18/16  Yes Historical Provider, MD  metoprolol tartrate (LOPRESSOR) 25 MG tablet Take 1 tablet (25 mg total) by mouth 2 (two) times daily. ON NON-HD DAYS Patient not taking: Reported on 12/26/2016 12/26/16   Will Meredith Leeds, MD     Anti-infectives    None      Results for orders placed or performed during the hospital encounter of 12/26/16 (from the past 48 hour(s))  Basic metabolic panel     Status: Abnormal   Collection Time: 12/26/16  8:20 PM  Result Value Ref Range   Sodium 139 135 - 145 mmol/L   Potassium 4.4 3.5 - 5.1 mmol/L   Chloride 97 (L) 101 - 111 mmol/L   CO2 24 22 - 32 mmol/L   Glucose, Bld 131 (H) 65 - 99 mg/dL   BUN 37 (H) 6 - 20 mg/dL   Creatinine, Ser 9.37 (H) 0.61 - 1.24 mg/dL   Calcium 9.2 8.9 - 10.3 mg/dL   GFR calc non Af Amer 5 (L) >60 mL/min   GFR calc Af Amer 6 (L) >60 mL/min    Comment: (NOTE) The eGFR has been calculated using the CKD EPI equation. This  calculation has not been validated in all clinical situations. eGFR's persistently <60 mL/min signify possible Chronic Kidney Disease.    Anion gap 18 (H) 5 - 15  CBC     Status: Abnormal   Collection Time: 12/26/16  8:20  PM  Result Value Ref Range   WBC 8.0 4.0 - 10.5 K/uL   RBC 3.82 (L) 4.22 - 5.81 MIL/uL   Hemoglobin 10.7 (L) 13.0 - 17.0 g/dL   HCT 34.7 (L) 39.0 - 52.0 %   MCV 90.8 78.0 - 100.0 fL   MCH 28.0 26.0 - 34.0 pg   MCHC 30.8 30.0 - 36.0 g/dL   RDW 18.5 (H) 11.5 - 15.5 %   Platelets 315 150 - 400 K/uL  Glucose, capillary     Status: Abnormal   Collection Time: 12/26/16  9:49 PM  Result Value Ref Range   Glucose-Capillary 120 (H) 65 - 99 mg/dL  Glucose, capillary     Status: Abnormal   Collection Time: 12/27/16  3:07 AM  Result Value Ref Range   Glucose-Capillary 149 (H) 65 - 99 mg/dL  MRSA PCR Screening     Status: None   Collection Time: 12/27/16  3:24 AM  Result Value Ref Range   MRSA by PCR NEGATIVE NEGATIVE    Comment:        The GeneXpert MRSA Assay (FDA approved for NASAL specimens only), is one component of a comprehensive MRSA colonization surveillance program. It is not intended to diagnose MRSA infection nor to guide or monitor treatment for MRSA infections.   Protime-INR     Status: None   Collection Time: 12/27/16  4:20 AM  Result Value Ref Range   Prothrombin Time 13.4 11.4 - 15.2 seconds   INR 1.02   Troponin I (q 6hr x 3)     Status: Abnormal   Collection Time: 12/27/16  4:20 AM  Result Value Ref Range   Troponin I 0.03 (HH) <0.03 ng/mL    Comment: CRITICAL RESULT CALLED TO, READ BACK BY AND VERIFIED WITH: VIJETIC J,RN 12/27/16 0634 WAYK   Basic metabolic panel     Status: Abnormal   Collection Time: 12/27/16  4:20 AM  Result Value Ref Range   Sodium 140 135 - 145 mmol/L   Potassium 4.2 3.5 - 5.1 mmol/L   Chloride 98 (L) 101 - 111 mmol/L   CO2 24 22 - 32 mmol/L   Glucose, Bld 144 (H) 65 - 99 mg/dL   BUN 41 (H) 6 - 20 mg/dL    Creatinine, Ser 10.42 (H) 0.61 - 1.24 mg/dL   Calcium 9.2 8.9 - 10.3 mg/dL   GFR calc non Af Amer 4 (L) >60 mL/min   GFR calc Af Amer 5 (L) >60 mL/min    Comment: (NOTE) The eGFR has been calculated using the CKD EPI equation. This calculation has not been validated in all clinical situations. eGFR's persistently <60 mL/min signify possible Chronic Kidney Disease.    Anion gap 18 (H) 5 - 15  CBC     Status: Abnormal   Collection Time: 12/27/16  4:20 AM  Result Value Ref Range   WBC 6.1 4.0 - 10.5 K/uL   RBC 3.78 (L) 4.22 - 5.81 MIL/uL   Hemoglobin 10.6 (L) 13.0 - 17.0 g/dL   HCT 34.2 (L) 39.0 - 52.0 %   MCV 90.5 78.0 - 100.0 fL   MCH 28.0 26.0 - 34.0 pg   MCHC 31.0 30.0 - 36.0 g/dL   RDW 18.5 (H) 11.5 - 15.5 %   Platelets 315 150 - 400 K/uL  TSH     Status: None   Collection Time: 12/27/16  4:20 AM  Result Value Ref Range   TSH 1.011 0.350 - 4.500 uIU/mL  Comment: Performed by a 3rd Generation assay with a functional sensitivity of <=0.01 uIU/mL.  Glucose, capillary     Status: Abnormal   Collection Time: 12/27/16  7:41 AM  Result Value Ref Range   Glucose-Capillary 128 (H) 65 - 99 mg/dL   Comment 1 Notify RN   D-dimer, quantitative (not at [facility])     Status: Abnormal   Collection Time: 12/27/16  7:56 AM  Result Value Ref Range   D-Dimer, Quant 2.61 (H) 0.00 - 0.50 ug/mL-FEU    Comment: (NOTE) At the manufacturer cut-off of 0.50 ug/mL FEU, this assay has been documented to exclude PE with a sensitivity and negative predictive value of 97 to 99%.  At this time, this assay has not been approved by the FDA to exclude DVT/VTE. Results should be correlated with clinical presentation.      ROS: See hpi for all positves   Physical Exam: Vitals:   12/27/16 0538 12/27/16 0821  BP: 128/60 126/66  Pulse: 79 87  Resp: 18 18  Temp: 98.4 F (36.9 C) 98 F (36.7 C)     General: alert obese AAM, NAD  , talkative ,pleasant , OX3 HEENT: [place], MMM, EOMI , Nonicteric Neck: no  jvd, supple Heart: RRR , 2/6 sem , no rub or gallop Lungs: CTA nonlabored breathing  Abdomen: obese, soft, NT, ND Extremities: Trace bipedal edema Skin: nop overt rash or pedal ulcers Neuro: Alert OX3 ,no  Overt focal deficits  Dialysis Access: pos bruit   Dialysis Orders: Center:   on TTS . EDW 106.5  HD Bath 2k, 2ca  Time 4hrs Heparin none. Access RUA AVF     Hec 6 mcg IV/HD  Mircera 121mg given 12/25/16  q 2wweks   Units IV/HD  Venofer  50 q weekly    Assessment/Plan  1. ESRD -  HD TTS - HD today  Mild vol on CXR last pm attempt 2-3 luf on hd as bp allows/ current wt 105.9 slightly below edw  2. CAD sp LAD pci 08/2016  With + Troponin  Card seeing  3. Hypertension/volume  -bp 126/66 using Midodrine for bp support on hD/ low dose BB 4. Anemia  - hgb 10.6  , given Mircera 100 on 12/25/16/ next dose next week / weekly venofer  5. Metabolic bone disease -  Hec on hd  / Lanthanum carbonate as binder 6. Fatigue - wu per admit 7. AVNRT s/p ablation 11/26/14- cards seeing  8. DM type 2 - per admit  9. Anxiety / Depression - on meds  10. HO DJD sp R TKR/ needs L done when Card clears- sees op PT  DErnest Haber PA-C [id number]/29/2018, 9:59 AM   I have seen and examined this patient and agree with plan and assessment in the above note with renal recommendations/intervention highlighted.  Will challenge edw as BP tolerates. JBroadus JohnA Rainer Mounce,MD 12/27/2016 3:30 PM

## 2016-12-27 NOTE — Progress Notes (Signed)
Pt is alert and oriented with cardiologist at the bedside. Wife called prior to coming in and would like more of a update.

## 2016-12-27 NOTE — Procedures (Signed)
I was present at this dialysis session. I have reviewed the session itself and made appropriate changes.   Filed Weights   12/27/16 0248 12/27/16 1418  Weight: 105.9 kg (233 lb 6.4 oz) 106.6 kg (235 lb 0.2 oz)     Recent Labs Lab 12/27/16 0420  NA 140  K 4.2  CL 98*  CO2 24  GLUCOSE 144*  BUN 41*  CREATININE 10.42*  CALCIUM 9.2     Recent Labs Lab 12/26/16 2020 12/27/16 0420  WBC 8.0 6.1  HGB 10.7* 10.6*  HCT 34.7* 34.2*  MCV 90.8 90.5  PLT 315 315    Scheduled Meds: . allopurinol  100 mg Oral Daily  . aspirin  81 mg Oral Daily  . cinacalcet  90 mg Oral BID WC  . [START ON 12/28/2016] citalopram  10 mg Oral Once per day on Mon Wed Fri  . clopidogrel  75 mg Oral Q breakfast  . colesevelam  1,875 mg Oral BID WC  . [START ON 01/03/2017] darbepoetin (ARANESP) injection - DIALYSIS  100 mcg Intravenous Q Thu-HD  . [START ON 12/29/2016] doxercalciferol  6 mcg Intravenous Q T,Th,Sa-HD  . ezetimibe  10 mg Oral Daily  . [START ON 01/01/2017] ferric gluconate (FERRLECIT/NULECIT) IV  62.5 mg Intravenous Q Tue-HD  . gabapentin  300 mg Oral QHS  . heparin  5,000 Units Subcutaneous Q8H  . insulin aspart  0-9 Units Subcutaneous TID WC  . lanthanum  3,000 mg Oral BID WC  . methimazole  5 mg Oral Daily  . [START ON 12/28/2016] metoprolol tartrate  25 mg Oral 2 times per day on Sun Mon Wed Fri  . midodrine  10 mg Oral Q T,Th,S,Su  . multivitamin  1 tablet Oral QHS  . pantoprazole  40 mg Oral BID   Continuous Infusions: PRN Meds:.acetaminophen **OR** acetaminophen, HYDROcodone-acetaminophen   Donetta Potts,  MD 12/27/2016, 3:31 PM

## 2016-12-27 NOTE — Progress Notes (Signed)
Pt educated about safety and importance of bed alarm during the night however pt refuses to be on bed alarm. Will continue to round on patient.   Mykelti Goldenstein, RN    

## 2016-12-27 NOTE — ED Notes (Signed)
istat troponin  0.02

## 2016-12-28 ENCOUNTER — Encounter (HOSPITAL_COMMUNITY): Payer: Medicare Other

## 2016-12-28 ENCOUNTER — Telehealth (HOSPITAL_COMMUNITY): Payer: Self-pay | Admitting: Cardiac Rehabilitation

## 2016-12-28 LAB — RENAL FUNCTION PANEL
ALBUMIN: 3 g/dL — AB (ref 3.5–5.0)
Anion gap: 15 (ref 5–15)
BUN: 19 mg/dL (ref 6–20)
CO2: 30 mmol/L (ref 22–32)
CREATININE: 6.53 mg/dL — AB (ref 0.61–1.24)
Calcium: 9.2 mg/dL (ref 8.9–10.3)
Chloride: 92 mmol/L — ABNORMAL LOW (ref 101–111)
GFR calc Af Amer: 9 mL/min — ABNORMAL LOW (ref >60)
GFR calc non Af Amer: 8 mL/min — ABNORMAL LOW (ref >60)
Glucose, Bld: 157 mg/dL — ABNORMAL HIGH (ref 65–99)
Phosphorus: 5.5 mg/dL — ABNORMAL HIGH (ref 2.5–4.6)
Potassium: 3.4 mmol/L — ABNORMAL LOW (ref 3.5–5.1)
Sodium: 137 mmol/L (ref 135–145)

## 2016-12-28 LAB — CBC
HCT: 33.4 % — ABNORMAL LOW (ref 39.0–52.0)
Hemoglobin: 10.2 g/dL — ABNORMAL LOW (ref 13.0–17.0)
MCH: 27.7 pg (ref 26.0–34.0)
MCHC: 30.5 g/dL (ref 30.0–36.0)
MCV: 90.8 fL (ref 78.0–100.0)
Platelets: 337 10*3/uL (ref 150–400)
RBC: 3.68 MIL/uL — ABNORMAL LOW (ref 4.22–5.81)
RDW: 18.5 % — AB (ref 11.5–15.5)
WBC: 5.5 10*3/uL (ref 4.0–10.5)

## 2016-12-28 LAB — GLUCOSE, CAPILLARY
GLUCOSE-CAPILLARY: 143 mg/dL — AB (ref 65–99)
Glucose-Capillary: 146 mg/dL — ABNORMAL HIGH (ref 65–99)

## 2016-12-28 LAB — HEPATITIS B SURFACE ANTIBODY,QUALITATIVE: HEP B S AB: NONREACTIVE

## 2016-12-28 NOTE — Progress Notes (Signed)
Patient with no complaints or concerns during 7pm - 7am shift. Slept during the night.   Krystie Leiter, RN 

## 2016-12-28 NOTE — Progress Notes (Signed)
Assessment/Plan  1. ESRD -  HD TTS - HD yesterday -270cc 2. CAD sp LAD pci 08/2016  With + Troponin  Card cleared    Subjective: Interval History:  Feels ok  Objective: Vital signs in last 24 hours: Temp:  [97.1 F (36.2 C)-98.4 F (36.9 C)] 97.1 F (36.2 C) (03/30 0513) Pulse Rate:  [75-101] 91 (03/30 0513) Resp:  [14-20] 20 (03/30 0513) BP: (93-170)/(61-97) 102/64 (03/30 0513) SpO2:  [98 %-99 %] 98 % (03/30 0513) Weight:  [105.6 kg (232 lb 11.2 oz)-106.6 kg (235 lb 0.2 oz)] 105.6 kg (232 lb 11.2 oz) (03/30 0513) Weight change: 0.731 kg (1 lb 9.8 oz)  Intake/Output from previous day: 03/29 0701 - 03/30 0700 In: 600 [P.O.:600] Out: 481  Intake/Output this shift: Total I/O In: 120 [P.O.:120] Out: -   General appearance: alert and cooperative Head: Normocephalic, without obvious abnormality, atraumatic, CPAP Resp: clear to auscultation bilaterally Chest wall: no tenderness Cardio: regular rate and rhythm, S1, S2 normal, no murmur, click, rub or gallop Extremities: extremities normal, atraumatic, no cyanosis or edema RUE AVF  Lab Results:  Recent Labs  12/27/16 0420 12/28/16 0428  WBC 6.1 5.5  HGB 10.6* 10.2*  HCT 34.2* 33.4*  PLT 315 337   BMET:  Recent Labs  12/27/16 0420 12/28/16 0428  NA 140 137  K 4.2 3.4*  CL 98* 92*  CO2 24 30  GLUCOSE 144* 157*  BUN 41* 19  CREATININE 10.42* 6.53*  CALCIUM 9.2 9.2   No results for input(s): PTH in the last 72 hours. Iron Studies: No results for input(s): IRON, TIBC, TRANSFERRIN, FERRITIN in the last 72 hours. Studies/Results: Dg Chest 2 View  Result Date: 12/26/2016 CLINICAL DATA:  Acute onset of generalized fatigue. Initial encounter. EXAM: CHEST  2 VIEW COMPARISON:  Chest radiograph performed 10/16/2016 FINDINGS: The lungs are well-aerated. Vascular congestion is noted. Increased interstitial markings may reflect mild interstitial edema. There is no evidence of pleural effusion or pneumothorax. The heart is  borderline normal in size. No acute osseous abnormalities are seen. A vascular stent is noted overlying the right axilla. Cervical spinal fusion hardware is partially imaged. IMPRESSION: Vascular congestion. Increased interstitial markings may reflect mild interstitial edema. Electronically Signed   By: Garald Balding M.D.   On: 12/26/2016 21:17    Scheduled: . allopurinol  100 mg Oral Daily  . aspirin  81 mg Oral Daily  . cinacalcet  90 mg Oral BID WC  . citalopram  10 mg Oral Once per day on Mon Wed Fri  . clopidogrel  75 mg Oral Q breakfast  . colesevelam  1,875 mg Oral BID WC  . [START ON 01/03/2017] darbepoetin (ARANESP) injection - DIALYSIS  100 mcg Intravenous Q Thu-HD  . [START ON 12/29/2016] doxercalciferol  6 mcg Intravenous Q T,Th,Sa-HD  . ezetimibe  10 mg Oral Daily  . [START ON 01/01/2017] ferric gluconate (FERRLECIT/NULECIT) IV  62.5 mg Intravenous Q Tue-HD  . gabapentin  300 mg Oral QHS  . heparin  5,000 Units Subcutaneous Q8H  . insulin aspart  0-9 Units Subcutaneous TID WC  . lanthanum  3,000 mg Oral BID WC  . methimazole  5 mg Oral Daily  . metoprolol tartrate  25 mg Oral 2 times per day on Sun Mon Wed Fri  . midodrine  10 mg Oral Q T,Th,S,Su  . multivitamin  1 tablet Oral QHS  . pantoprazole  40 mg Oral BID      LOS: 1 day  Aitanna Haubner C 12/28/2016,11:39 AM

## 2016-12-28 NOTE — Progress Notes (Signed)
Pt has orders to be discharged. Discharge instructions given and pt has no additional questions at this time. Medication regimen reviewed and pt educated. Pt verbalized understanding and has no additional questions. Telemetry box removed. IV removed and site in good condition. Pt stable and waiting for transportation.   Bethanny Toelle RN 

## 2016-12-28 NOTE — Telephone Encounter (Signed)
pc to pt to discuss symptoms since hospital d/c.  Pt continues to have weakness and fatigue. Pt wife instructed to call MD if symptoms worsen.  Pt wife also instructed to not return to cardiac rehab until clearance received.  Understanding verbalized.

## 2016-12-28 NOTE — Discharge Summary (Addendum)
Physician Discharge Summary  [name] WUX:[id number] DOB: 11/16/1950 DOA: 12/26/2016  PCP: [name], MD  Admit date: 12/26/2016 Discharge date: 12/28/2016  Recommendations for Outpatient Follow-up:  1. Pt will need to follow up with PCP in 2-3 weeks post discharge 2. Please obtain BMP to evaluate electrolytes and kidney function 3. Please also check CBC to evaluate Hg and Hct levels  Discharge Diagnoses:  Principal Problem:   Weakness Active Problems:   Gout   OSA (obstructive sleep apnea)   Hyperlipidemia   Status post coronary artery stent placement   ESRD on hemodialysis ([place])   Generalized weakness  Discharge Condition: Stable  Diet recommendation: renal diet  Brief Narrative:  66 y.o. male with ESRD 2/2 DM / HD start 08/2008 (OP TTS [facility]M [facility]), DM Type 2, CAD / RCA PTA  stent 2008, Ischemic CM Dr. 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   Assessment & Plan:   1. ESRD -  tolerated HD and wants to go home today, nephrology and cardiology team cleared for discharge  2. Dyspnea - from volume overload, non cardiogenic, resolved with HD, pt denies dyspnea this AM  3. CAD sp LAD pci 08/2016  With + Troponin  Cardio team had no further recommendations, cleared for discharge  4. Hypertension/volume  - using Midodrine for bp support on hD/ low dose BB 5. Anemia of chronic disease  - hgb 10.6  , given Mircera 100 on 12/25/16/ next dose next week / weekly venofer  6. AVNRT s/p ablation 11/26/14- cards cleared for discharge, no need for any interventions at this time  7. Obesity - Body mass index is 30.7 kg/m.  Procedures/Studies: Dg Chest 2 View  Result Date: 12/26/2016 CLINICAL DATA:  Acute onset of generalized fatigue. Initial encounter. EXAM: CHEST  2 VIEW COMPARISON:  Chest radiograph performed  10/16/2016 FINDINGS: The lungs are well-aerated. Vascular congestion is noted. Increased interstitial markings may reflect mild interstitial edema. There is no evidence of pleural effusion or pneumothorax. The heart is borderline normal in size. No acute osseous abnormalities are seen. A vascular stent is noted overlying the right axilla. Cervical spinal fusion hardware is partially imaged. IMPRESSION: Vascular congestion. Increased interstitial markings may reflect mild interstitial edema. Electronically Signed   By: Garald Balding M.D.   On: 12/26/2016 21:17     Discharge Exam: Vitals:   12/28/16 0513 12/28/16 1201  BP: 102/64 94/77  Pulse: 91 90  Resp: 20 18  Temp: 97.1 F (36.2 C) 98.3 F (36.8 C)   Vitals:   12/27/16 1939 12/28/16 0007 12/28/16 0513 12/28/16 1201  BP: (!) 144/69 116/69 102/64 94/77  Pulse: 93 (!) 101 91 90  Resp: 20 20 20 18   Temp: 97.5 F (36.4 C) 97.9 F (36.6 C) 97.1 F (36.2 C) 98.3 F (36.8 C)  TempSrc: Oral Oral Oral Oral  SpO2: 99% 99% 98% 96%  Weight:   105.6 kg (232 lb 11.2 oz)   Height:       General: Pt is alert, follows commands appropriately, not in acute distress Cardiovascular: Regular rate and rhythm, no rubs, no gallops Respiratory: Clear to auscultation bilaterally, no wheezing Abdominal: Soft, non tender, non distended, bowel sounds +, no guarding  Discharge Instructions   Allergies as of 12/28/2016      Reactions   Cephalexin Swelling, Other (See  Comments)   Tongue swelling   Statins Other (See Comments)   Weak muscles   Ciprofloxacin Rash      Medication List    TAKE these medications   acetaminophen 325 MG tablet Commonly known as:  TYLENOL Take 650 mg by mouth every 6 (six) hours as needed for mild pain or headache.   allopurinol 100 MG tablet Commonly known as:  ZYLOPRIM Take 1 tablet (100 mg total) by mouth daily.   aspirin 81 MG tablet Take 81 mg by mouth daily.   citalopram 10 MG tablet Commonly known as:   CELEXA Take 10 mg by mouth 3 (three) times a week.   clopidogrel 75 MG tablet Commonly known as:  PLAVIX Take 1 tablet (75 mg total) by mouth daily with breakfast.   colesevelam 625 MG tablet Commonly known as:  WELCHOL TAKE THREE TABLETS BY MOUTH TWICE DAILY WITH MEALS   ezetimibe 10 MG tablet Commonly known as:  ZETIA Take 1 tablet (10 mg total) by mouth daily.   gabapentin 300 MG capsule Commonly known as:  NEURONTIN 1-2 tabs by mouth at bedtime for sleep and pain   HYDROcodone-acetaminophen 5-325 MG tablet Commonly known as:  NORCO/VICODIN Take 1 tablet by mouth every 6 (six) hours as needed for moderate pain.   Lanthanum Carbonate 1000 MG Pack Take 3 packets by mouth 2 (two) times daily.   methimazole 5 MG tablet Commonly known as:  TAPAZOLE Take 1 tablet (5 mg total) by mouth daily.   metoprolol tartrate 25 MG tablet Commonly known as:  LOPRESSOR Take 1 tablet (25 mg total) by mouth 2 (two) times daily. ON NON-HD DAYS   midodrine 10 MG tablet Commonly known as:  PROAMATINE Take 1 tablet (10 mg total) by mouth See admin instructions. Once a day only on dialysis days (Tues/Thurs/Sat)   multivitamin Tabs tablet Take 1 tablet by mouth at bedtime. Reported on 10/24/2015   pantoprazole 40 MG tablet Commonly known as:  PROTONIX Take 40 mg by mouth 2 (two) times daily.   SENSIPAR 90 MG tablet Generic drug:  cinacalcet Take 90 mg by mouth 2 (two) times daily.       Follow-up Information    [name], MD Follow up.   Specialties:  Internal Medicine, Radiology Contact information: [address] [place] [address] [phone]            The results of significant diagnostics from this hospitalization (including imaging, microbiology, ancillary and laboratory) are listed below for reference.     Microbiology: Recent Results (from the past 240 hour(s))  MRSA PCR Screening     Status: None   Collection Time: 12/27/16  3:24 AM  Result Value  Ref Range Status   MRSA by PCR NEGATIVE NEGATIVE Final    Comment:        The GeneXpert MRSA Assay (FDA approved for NASAL specimens only), is one component of a comprehensive MRSA colonization surveillance program. It is not intended to diagnose MRSA infection nor to guide or monitor treatment for MRSA infections.      Labs: Basic Metabolic Panel:  Recent Labs Lab 12/26/16 2020 12/27/16 0420 12/27/16 1518 12/28/16 0428  NA 139 140  --  137  K 4.4 4.2  --  3.4*  CL 97* 98*  --  92*  CO2 24 24  --  30  GLUCOSE 131* 144*  --  157*  BUN 37* 41*  --  19  CREATININE 9.37* 10.42*  --  6.53*  CALCIUM 9.2 9.2  --  9.2  MG  --   --  2.1  --   PHOS  --   --   --  5.5*   Liver Function Tests:  Recent Labs Lab 12/27/16 1518 12/28/16 0428  AST 16  --   ALT 16*  --   ALKPHOS 65  --   BILITOT 0.7  --   PROT 6.8  --   ALBUMIN 2.9* 3.0*   CBC:  Recent Labs Lab 12/26/16 2020 12/27/16 0420 12/28/16 0428  WBC 8.0 6.1 5.5  HGB 10.7* 10.6* 10.2*  HCT 34.7* 34.2* 33.4*  MCV 90.8 90.5 90.8  PLT 315 315 337   Cardiac Enzymes:  Recent Labs Lab 12/27/16 0420 12/27/16 1518  TROPONINI 0.03* 0.03*   CBG:  Recent Labs Lab 12/27/16 0741 12/27/16 1132 12/27/16 2025 12/28/16 0748 12/28/16 1114  GLUCAP 128* 142* 127* 146* 143*   SIGNED: Time coordinating discharge: 30 minutes  MAGICK-MYERS, ISKRA, MD  [facility] 12/28/2016, 1:10 PM Pager [phone]  If 7PM-7AM, please contact night-coverage [email or web address] Password [email or web address]

## 2016-12-28 NOTE — Consult Note (Signed)
   [facility] CM Inpatient Consult   12/28/2016  [name] 1951-07-02 [id number]   Patient assessed for frequent admissions and ED visits as a beneficiary of the [facility] ACO Registry.  Came by to speak with patient he was resting quietly.  Patient with CPAP on resting at this time.  He states he was doing okay and I could leave the information on his bedside table and he "just want to rest right now."  Patient is being followed by the HF clinic he says and was working with Cardiac Rehab prior to admission as well. Chart review reveals the patient is [name] is a 66 y.o. male who presents today for electrophysiology evaluation.   He has a history of obesity, diabetes, COPD on nighttime oxygen, sleep apnea on CPAP, ESRD- HD, CAD per MD notes.   Brochure with contact information was left on the bedside table as the patient directed and encouraged to review with his wife for any needs.   For questions, please contact:  Natividad Brood, RN BSN [facility] Liaison  [phone] business mobile phone Toll free office [phone]

## 2016-12-28 NOTE — Progress Notes (Signed)
PT Cancellation Note  Patient Details Name: NANCY MANUELE MRN: [id number] DOB: 1951/07/19   Cancelled Treatment:    Reason Eval/Treat Not Completed: Patient declined, no reason specified. Pt reports he has been amb in room adequately and doesn't have concerns about amb when dc'd home.   [place] 12/28/2016, 9:05 AM [place]

## 2016-12-28 NOTE — Discharge Instructions (Signed)
Shortness of Breath, Adult  Shortness of breath means you have trouble breathing. Your lungs are organs for breathing.  Follow these instructions at home:  Pay attention to any changes in your symptoms. Take these actions to help with your condition:  ? Do not smoke. Smoking can cause shortness of breath. If you need help to quit smoking, ask your doctor.  ? Avoid things that can make it harder to breathe, such as:  ? Mold.  ? Dust.  ? Air pollution.  ? Chemical smells.  ? Things that can cause allergy symptoms (allergens), if you have allergies.  ? Keep your living space clean and free of mold and dust.  ? Rest as needed. Slowly return to your usual activities.  ? Take over-the-counter and prescription medicines, including oxygen and inhaled medicines, only as told by your doctor.  ? Keep all follow-up visits as told by your doctor. This is important.  Contact a doctor if:  ? Your condition does not get better as soon as expected.  ? You have a hard time doing your normal activities, even after you rest.  ? You have new symptoms.  Get help right away if:  ? You have trouble breathing when you are resting.  ? You feel light-headed or you faint.  ? You have a cough that is not helped by medicines.  ? You cough up blood.  ? You have pain with breathing.  ? You have pain in your chest, arms, shoulders, or belly (abdomen).  ? You have a fever.  ? You cannot walk up stairs.  ? You cannot exercise the way you normally do.  This information is not intended to replace advice given to you by your health care provider. Make sure you discuss any questions you have with your health care provider.  Document Released: 03/05/2008 Document Revised: 10/04/2016 Document Reviewed: 10/04/2016  Elsevier Interactive Patient Education ? 2017 Elsevier Inc[employer]

## 2016-12-28 NOTE — Care Management Note (Addendum)
Case Management Note  Patient Details  Name: [name] MRN: [id number] Date of Birth: 1951/09/17  Subjective/Objective:                 Patient from home with wife, has HD TTS. PT eval pending, r/o PE today. Consult placed for medication needs. No medications that qualify for coupons identified at his time, this may change if DC'd w anticoag for PE such as Eliquis or Xaralto. Patient declined consult at bedside this morning, would prefer to sleep. No other needs identified at this time. Mas medicare and suppliment.  13:20 No changes to home meds per AVS.   Action/Plan:  CM continue to follow.  Expected Discharge Date:                  Expected Discharge Plan:     In-House Referral:     Discharge planning Services  CM Consult  Post Acute Care Choice:    Choice offered to:     DME Arranged:    DME Agency:     HH Arranged:    HH Agency:     Status of Service:  In process, will continue to follow  If discussed at Long Length of Stay Meetings, dates discussed:    Additional Comments:  Carles Collet, RN 12/28/2016, 10:26 AM

## 2016-12-29 DIAGNOSIS — Z992 Dependence on renal dialysis: Secondary | ICD-10-CM | POA: Diagnosis not present

## 2016-12-29 DIAGNOSIS — D631 Anemia in chronic kidney disease: Secondary | ICD-10-CM | POA: Diagnosis not present

## 2016-12-29 DIAGNOSIS — E1129 Type 2 diabetes mellitus with other diabetic kidney complication: Secondary | ICD-10-CM | POA: Diagnosis not present

## 2016-12-29 DIAGNOSIS — N2581 Secondary hyperparathyroidism of renal origin: Secondary | ICD-10-CM | POA: Diagnosis not present

## 2016-12-29 DIAGNOSIS — N186 End stage renal disease: Secondary | ICD-10-CM | POA: Diagnosis not present

## 2016-12-29 DIAGNOSIS — E119 Type 2 diabetes mellitus without complications: Secondary | ICD-10-CM | POA: Diagnosis not present

## 2016-12-31 ENCOUNTER — Telehealth (HOSPITAL_COMMUNITY): Payer: Self-pay | Admitting: Cardiac Rehabilitation

## 2016-12-31 ENCOUNTER — Encounter (HOSPITAL_COMMUNITY): Admission: RE | Admit: 2016-12-31 | Payer: Medicare Other | Source: Ambulatory Visit

## 2016-12-31 DIAGNOSIS — M19031 Primary osteoarthritis, right wrist: Secondary | ICD-10-CM | POA: Diagnosis not present

## 2016-12-31 DIAGNOSIS — M10031 Idiopathic gout, right wrist: Secondary | ICD-10-CM | POA: Diagnosis not present

## 2016-12-31 DIAGNOSIS — M25531 Pain in right wrist: Secondary | ICD-10-CM | POA: Diagnosis not present

## 2016-12-31 NOTE — Telephone Encounter (Signed)
-----   Message from Jolaine Artist, MD sent at 12/28/2016  9:38 PM EDT ----- Regarding: RE: cardiac rehab  Immediately is fine.  Thanks  ----- Message ----- From: Lowell Guitar, RN Sent: 12/28/2016   3:48 PM To: Jolaine Artist, MD Subject: cardiac rehab                                  Dear Dr. [name],   Pt D/C from hospital today.  When should he return to cardiac rehab?  Thanks, Andi Hence, RN, BSN Cardiac Pulmonary Rehab

## 2017-01-01 DIAGNOSIS — E119 Type 2 diabetes mellitus without complications: Secondary | ICD-10-CM | POA: Diagnosis not present

## 2017-01-01 DIAGNOSIS — Z23 Encounter for immunization: Secondary | ICD-10-CM | POA: Diagnosis not present

## 2017-01-01 DIAGNOSIS — D631 Anemia in chronic kidney disease: Secondary | ICD-10-CM | POA: Diagnosis not present

## 2017-01-01 DIAGNOSIS — N186 End stage renal disease: Secondary | ICD-10-CM | POA: Diagnosis not present

## 2017-01-01 DIAGNOSIS — N2581 Secondary hyperparathyroidism of renal origin: Secondary | ICD-10-CM | POA: Diagnosis not present

## 2017-01-02 ENCOUNTER — Encounter (HOSPITAL_COMMUNITY)
Admission: RE | Admit: 2017-01-02 | Discharge: 2017-01-02 | Disposition: A | Discharge status: Home / Self Care | Payer: Medicare Other | Source: Ambulatory Visit | Attending: Internal Medicine | Admitting: Internal Medicine

## 2017-01-02 DIAGNOSIS — Z48812 Encounter for surgical aftercare following surgery on the circulatory system: Secondary | ICD-10-CM | POA: Diagnosis not present

## 2017-01-02 DIAGNOSIS — Z955 Presence of coronary angioplasty implant and graft: Secondary | ICD-10-CM

## 2017-01-02 DIAGNOSIS — I214 Non-ST elevation (NSTEMI) myocardial infarction: Secondary | ICD-10-CM | POA: Insufficient documentation

## 2017-01-03 DIAGNOSIS — N2581 Secondary hyperparathyroidism of renal origin: Secondary | ICD-10-CM | POA: Diagnosis not present

## 2017-01-03 DIAGNOSIS — E119 Type 2 diabetes mellitus without complications: Secondary | ICD-10-CM | POA: Diagnosis not present

## 2017-01-03 DIAGNOSIS — N186 End stage renal disease: Secondary | ICD-10-CM | POA: Diagnosis not present

## 2017-01-03 DIAGNOSIS — D631 Anemia in chronic kidney disease: Secondary | ICD-10-CM | POA: Diagnosis not present

## 2017-01-03 DIAGNOSIS — Z23 Encounter for immunization: Secondary | ICD-10-CM | POA: Diagnosis not present

## 2017-01-04 ENCOUNTER — Encounter (HOSPITAL_COMMUNITY)
Admission: RE | Admit: 2017-01-04 | Discharge: 2017-01-04 | Disposition: A | Discharge status: Home / Self Care | Payer: Medicare Other | Source: Ambulatory Visit | Attending: Internal Medicine | Admitting: Internal Medicine

## 2017-01-04 ENCOUNTER — Other Ambulatory Visit: Payer: Self-pay

## 2017-01-04 DIAGNOSIS — I214 Non-ST elevation (NSTEMI) myocardial infarction: Secondary | ICD-10-CM

## 2017-01-04 DIAGNOSIS — Z955 Presence of coronary angioplasty implant and graft: Secondary | ICD-10-CM

## 2017-01-04 DIAGNOSIS — Z48812 Encounter for surgical aftercare following surgery on the circulatory system: Secondary | ICD-10-CM | POA: Diagnosis not present

## 2017-01-04 NOTE — Patient Outreach (Signed)
Patient triggered Red on EMMI Heart Failure Dashboard.  Notification sent to:  Quinn Plowman, RN

## 2017-01-04 NOTE — Patient Outreach (Signed)
[facility]) Care Management  01/04/2017  [name] 1951-02-19 [id number]  EMMI heart failure EMMI heart failure red alert Day #3 REFERRAL DATE: 01/04/17 REFERRAL REASON: Weighed themselves today: NO CONSENT; Patient gave verbal consent to speak with his wife, [name] regarding all of his personal health information.  Telephone call to patient regarding EMMI heart failure red alert. HIPAA verified with patient. Spoke with patients wife, [name] as patient requested. Wife states patient weighs every other day. Wife states patient goes to dialysis 3 times per week and cardiac rehab 3 times per week. Wife states patient has a follow up appointment with his primary MD on 01/09/17 and has a follow up with Dr. Haroldine [name] on 01/14/17.  Wife states she is really not sure why her husband was in the hospital. Wife states prior to patient being admitted  Wife states patient did not have a good experience while in the hospital.  States she was displeased with the way patient was discharged. RNCM advised patients wife to call hospital patient experience department to notify them of her concerns.  RNCM gave patient contact phone number to call.  Wife states her husband is just really weak. States patient has been having some problems with his right hand due to arthritis.   Wife states she took patient to the orthopedic doctor this week and he had an injection. Wife states she has to assist patient with his care.  Wife denies patient having any increase shortness of breath or swelling today. Advised wife that patient needs to weigh consistently. Advised if 3 lb weight gain overnight or 5 lbs in a week to call and report this to patients doctor. Wife states patient falls a lot.  Wife states he has had approximately 5 falls. Wife states he did hurt his back with one of the falls.  RNCM discussed and offered [facility] care management services to wife for patient. Wife states she will have to talk  with patient about this and will let RNCM know at next weeks outreach call.  RNCM advised wife for patient to keep follow up appointments with doctor, call doctor if heart failure symptoms worsen.  Call 911 for severe symptoms.  RNCM advised patient should take all of his medications as directed by his doctors.   No further needs at this time voice by wife or patient.   ASSESSMENT: 66 y.o.malewith ESRD 2/2DM / HD start 08/2008 (OP TTS [facility]M [facility]), DM Type 2, CAD / RCA PTA stent 2008, Ischemic CM Dr. Haroldine [name] follows /mild TR/STEMI LAD PCI Stent 08/2016, Morbid Obesity /OSA uses CPAP, sp AVNRT s/p ablation 2/26/16with OV,  PLAN: RNCM will follow up with patient/ wife within 1 week and reoffer [facility] care management services.   [name] Mark RN,BSN,CCM [facility] Telephonic  [phone]

## 2017-01-05 DIAGNOSIS — E119 Type 2 diabetes mellitus without complications: Secondary | ICD-10-CM | POA: Diagnosis not present

## 2017-01-05 DIAGNOSIS — N186 End stage renal disease: Secondary | ICD-10-CM | POA: Diagnosis not present

## 2017-01-05 DIAGNOSIS — N2581 Secondary hyperparathyroidism of renal origin: Secondary | ICD-10-CM | POA: Diagnosis not present

## 2017-01-05 DIAGNOSIS — D631 Anemia in chronic kidney disease: Secondary | ICD-10-CM | POA: Diagnosis not present

## 2017-01-05 DIAGNOSIS — Z23 Encounter for immunization: Secondary | ICD-10-CM | POA: Diagnosis not present

## 2017-01-07 ENCOUNTER — Ambulatory Visit: Payer: Medicare Other | Admitting: Neurology

## 2017-01-07 ENCOUNTER — Encounter (HOSPITAL_COMMUNITY)
Admission: RE | Admit: 2017-01-07 | Discharge: 2017-01-07 | Disposition: A | Discharge status: Home / Self Care | Payer: Medicare Other | Source: Ambulatory Visit | Attending: Internal Medicine | Admitting: Internal Medicine

## 2017-01-07 DIAGNOSIS — I214 Non-ST elevation (NSTEMI) myocardial infarction: Secondary | ICD-10-CM

## 2017-01-07 DIAGNOSIS — Z955 Presence of coronary angioplasty implant and graft: Secondary | ICD-10-CM | POA: Diagnosis not present

## 2017-01-07 DIAGNOSIS — Z48812 Encounter for surgical aftercare following surgery on the circulatory system: Secondary | ICD-10-CM | POA: Diagnosis not present

## 2017-01-08 DIAGNOSIS — N2581 Secondary hyperparathyroidism of renal origin: Secondary | ICD-10-CM | POA: Diagnosis not present

## 2017-01-08 DIAGNOSIS — D631 Anemia in chronic kidney disease: Secondary | ICD-10-CM | POA: Diagnosis not present

## 2017-01-08 DIAGNOSIS — Z23 Encounter for immunization: Secondary | ICD-10-CM | POA: Diagnosis not present

## 2017-01-08 DIAGNOSIS — E119 Type 2 diabetes mellitus without complications: Secondary | ICD-10-CM | POA: Diagnosis not present

## 2017-01-08 DIAGNOSIS — N186 End stage renal disease: Secondary | ICD-10-CM | POA: Diagnosis not present

## 2017-01-09 ENCOUNTER — Other Ambulatory Visit (INDEPENDENT_AMBULATORY_CARE_PROVIDER_SITE_OTHER): Payer: Medicare Other

## 2017-01-09 ENCOUNTER — Encounter (HOSPITAL_COMMUNITY)
Admission: RE | Admit: 2017-01-09 | Discharge: 2017-01-09 | Disposition: A | Discharge status: Home / Self Care | Payer: Medicare Other | Source: Ambulatory Visit | Attending: Internal Medicine | Admitting: Internal Medicine

## 2017-01-09 ENCOUNTER — Ambulatory Visit (INDEPENDENT_AMBULATORY_CARE_PROVIDER_SITE_OTHER): Payer: Medicare Other | Admitting: Internal Medicine

## 2017-01-09 ENCOUNTER — Encounter: Payer: Self-pay | Admitting: Internal Medicine

## 2017-01-09 ENCOUNTER — Telehealth: Payer: Self-pay | Admitting: Internal Medicine

## 2017-01-09 VITALS — BP 122/64 | HR 70 | Temp 98.9°F | Ht 73.0 in | Wt 236.0 lb

## 2017-01-09 DIAGNOSIS — I214 Non-ST elevation (NSTEMI) myocardial infarction: Secondary | ICD-10-CM

## 2017-01-09 DIAGNOSIS — I5032 Chronic diastolic (congestive) heart failure: Secondary | ICD-10-CM

## 2017-01-09 DIAGNOSIS — I2510 Atherosclerotic heart disease of native coronary artery without angina pectoris: Secondary | ICD-10-CM

## 2017-01-09 DIAGNOSIS — R531 Weakness: Secondary | ICD-10-CM

## 2017-01-09 DIAGNOSIS — Z955 Presence of coronary angioplasty implant and graft: Secondary | ICD-10-CM

## 2017-01-09 DIAGNOSIS — Z48812 Encounter for surgical aftercare following surgery on the circulatory system: Secondary | ICD-10-CM | POA: Diagnosis not present

## 2017-01-09 LAB — BASIC METABOLIC PANEL
BUN: 46 mg/dL — ABNORMAL HIGH (ref 6–23)
CALCIUM: 9.1 mg/dL (ref 8.4–10.5)
CO2: 28 mEq/L (ref 19–32)
Chloride: 99 mEq/L (ref 96–112)
Creatinine, Ser: 8.51 mg/dL (ref 0.40–1.50)
GFR: 8.08 mL/min — AB (ref >60.00)
Glucose, Bld: 175 mg/dL — ABNORMAL HIGH (ref 70–99)
Potassium: 4.1 mEq/L (ref 3.5–5.1)
SODIUM: 142 meq/L (ref 135–145)

## 2017-01-09 NOTE — Progress Notes (Signed)
Pre visit review using our clinic review tool, if applicable. No additional management support is needed unless otherwise documented below in the visit note. 

## 2017-01-09 NOTE — Patient Instructions (Addendum)
Please continue all other medications as before, and refills have been done if requested.  Please have the pharmacy call with any other refills you may need.  Please continue your efforts at being more active, low cholesterol diet, and weight control.  You are otherwise up to date with prevention measures today.  Please keep your appointments with your specialists as you may have planned  Please go to the LAB in the Basement (turn left off the elevator) for the tests to be done today  You will be contacted by phone if any changes need to be made immediately.  Otherwise, you will receive a letter about your results with an explanation, but please check with MyChart first.  Please remember to sign up for MyChart if you have not done so, as this will be important to you in the future with finding out test results, communicating by private email, and scheduling acute appointments online when needed.  Please return in 6 months, or sooner if needed 

## 2017-01-09 NOTE — Telephone Encounter (Signed)
Received a call from lab [place] service re critical lab of cr 8.5.  No action taken since patient is on dialysis,  Just fyi

## 2017-01-09 NOTE — Progress Notes (Signed)
Subjective:    Patient ID: [name], male    DOB: 23-Jan-1951, 66 y.o.   MRN: [id number]  HPI  Here to f/u recent hospn: Admit date: 12/26/2016 Discharge date: 12/28/2016, who presented with progressive dyspnea that is worse with exertion, fatigue, LE swelling. Eval and tx : 1. ESRD - tolerated HD and wants to go home today, nephrology and cardiology team cleared for discharge  2. Dyspnea - from volume overload, non cardiogenic, resolved with HD, pt denies dyspnea this AM  3. CAD sp LAD pci 08/2016 With + Troponin Cardio team had no further recommendations, cleared for discharge  4. Hypertension/volume - using Midodrine for bp support on hD/ low dose BB 5. Anemia of chronic disease - hgb 10.6 , given Mircera 100 on 12/25/16/ next dose next week / weekly venofer  6. AVNRT s/p ablation 11/26/14- cards cleared for discharge, no need for any interventions at this time At time of d/c was recommended:  Recommendations for Outpatient Follow-up:  1. Pt will need to follow up with PCP in 2-3 weeks post discharge 2. Please obtain BMP to evaluate electrolytes and kidney function 3. Please also check CBC to evaluate Hg and Hct levels Currently , pt denies chest pain, increased sob or doe, wheezing, orthopnea, PND, increased LE swelling, palpitations, dizziness or syncope. No other new hx. Does c/o ongoing fatigue, but denies signficant daytime hypersomnolence. Past Medical History:  Diagnosis Date  . Allergic rhinitis, cause unspecified 02/24/2014  . Anemia 06/16/2011  . BENIGN PROSTATIC HYPERTROPHY 10/14/2009  . CAD, NATIVE VESSEL 02/06/2009   saw Dr. Missy Sabins last jan 2013  . Cervical radiculopathy, chronic 02/23/2016   Right c5-6 by NCS/EMG  . CHEST PAIN 03/29/2010  . COLONIC POLYPS, HX OF 10/14/2009  . CONGESTIVE HEART FAILURE 06/18/2007  . Dementia [id number]  . DEPRESSION 10/14/2009  . Depression 09/24/2015  . DIABETES MELLITUS, TYPE II 02/01/2010  . DIASTOLIC HEART FAILURE, CHRONIC  02/06/2009  . DIZZINESS 07/17/2010  . DYSLIPIDEMIA 06/18/2007  . DYSPNEA 10/29/2008  . ESRD (end stage renal disease) on dialysis ([place]) 08/04/2010   "TTS;  " (04/18/2015)  . FOOT PAIN 08/12/2008  . GAIT DISTURBANCE 03/03/2010  . GASTROENTERITIS, VIRAL 10/14/2009  . GERD 06/18/2007  . GOITER, MULTINODULAR 12/26/2007  . GOUT 06/18/2007  . GYNECOMASTIA 07/17/2010  . Hemodialysis access, fistula mature [facility])    Dialysis T-Th-Sa ([place]) Right upper arm fistula  . Hyperlipidemia 10/16/2011  . Hyperparathyroidism, secondary ([place]) 06/16/2011  . HYPERTENSION 06/18/2007  . Hyperthyroidism   . HYPERTHYROIDISM 02/02/2010  . Hypocalcemia 06/07/2010  . Ischemic cardiomyopathy 06/16/2011  . NECK PAIN 07/31/2010  . NSTEMI (non-ST elevated myocardial infarction) ([place]) 08/04/2016  . ONYCHOMYCOSIS, TOENAILS 12/26/2007  . OSA on CPAP 10/16/2011  . Other malaise and fatigue 11/24/2009  . PERIPHERAL NEUROPATHY 06/18/2007  . Prostate cancer ([place])   . PULMONARY NODULE, RIGHT LOWER LOBE 06/08/2009  . Renal insufficiency   . Sleep apnea    cpap machine and o2  . TRANSAMINASES, SERUM, ELEVATED 02/01/2010  . Transfusion history    none recent  . Unspecified hypotension 01/30/2010   Past Surgical History:  Procedure Laterality Date  . ARTERIOVENOUS GRAFT PLACEMENT Right 2009   forearm/notes 02/01/2011  . AV FISTULA PLACEMENT  11/07/2011   Procedure: INSERTION OF ARTERIOVENOUS (AV) GORE-TEX GRAFT ARM;  Surgeon: Tinnie Gens, MD;  Location: [place];  Service: Vascular;  Laterality: Left;  . BACK SURGERY  1998  . BASCILIC VEIN TRANSPOSITION Right 02/27/2013  Procedure: BASCILIC VEIN TRANSPOSITION;  Surgeon: Mal Misty, MD;  Location: [place];  Service: Vascular;  Laterality: Right;  Right Basilic Vein Transposition   . CARDIAC CATHETERIZATION N/A 08/06/2016   Procedure: Left Heart Cath and Coronary Angiography;  Surgeon: Jolaine Artist, MD;  Location: [place] CV LAB;  Service: Cardiovascular;  Laterality:  N/A;  . CARDIAC CATHETERIZATION N/A 08/07/2016   Procedure: Coronary/Graft Atherectomy-CSI LAD;  Surgeon: Peter M Martinique, MD;  Location: [place] CV LAB;  Service: Cardiovascular;  Laterality: N/A;  . CERVICAL SPINE SURGERY  2/09   "to repair nerve problems in my left arm"  . CHOLECYSTECTOMY    . CORONARY ANGIOPLASTY WITH STENT PLACEMENT  06/11/2008  . CORONARY ANGIOPLASTY WITH STENT PLACEMENT  06/2007   TAXUS stent to RCA/notes 01/31/2011  . ESOPHAGOGASTRODUODENOSCOPY  09/28/2011   Procedure: ESOPHAGOGASTRODUODENOSCOPY (EGD);  Surgeon: Missy Sabins, MD;  Location[facility];  Service: Endoscopy;  Laterality: N/A;  . ESOPHAGOGASTRODUODENOSCOPY N/A 04/07/2015   Procedure: ESOPHAGOGASTRODUODENOSCOPY (EGD);  Surgeon: Teena Irani, MD;  Location: [facility] ENDOSCOPY;  Service: Endoscopy;  Laterality: N/A;  . ESOPHAGOGASTRODUODENOSCOPY N/A 04/19/2015   Procedure: ESOPHAGOGASTRODUODENOSCOPY (EGD);  Surgeon: Arta Silence, MD;  Location: [facility] ENDOSCOPY;  Service: Endoscopy;  Laterality: N/A;  . FOREIGN BODY REMOVAL  09/2003   via upper endoscopy/notes 02/12/2011  . GIVENS CAPSULE STUDY  09/30/2011   Procedure: GIVENS CAPSULE STUDY;  Surgeon: Jeryl Columbia, MD;  Location: [facility] ENDOSCOPY;  Service: Endoscopy;  Laterality: N/A;  . INSERTION OF DIALYSIS CATHETER Right 2014  . INSERTION OF DIALYSIS CATHETER Left 02/11/2013   Procedure: INSERTION OF DIALYSIS CATHETER;  Surgeon: Conrad West Sullivan, MD;  Location: [place];  Service: Vascular;  Laterality: Left;  Ultrasound guided  . REMOVAL OF A DIALYSIS CATHETER Right 02/11/2013   Procedure: REMOVAL OF A DIALYSIS CATHETER;  Surgeon: Conrad Midfield, MD;  Location: [place];  Service: Vascular;  Laterality: Right;  . SAVORY DILATION N/A 04/07/2015   Procedure: SAVORY DILATION;  Surgeon: Teena Irani, MD;  Location: [facility] ENDOSCOPY;  Service: Endoscopy;  Laterality: N/A;  . SHUNTOGRAM N/A 09/20/2011   Procedure: Earney Mallet;  Surgeon: Conrad [place], MD;  Location: [facility] CATH LAB;  Service:  Cardiovascular;  Laterality: N/A;  . SVT ABLATION N/A 11/26/2016   Procedure: SVT Ablation;  Surgeon: Will Meredith Leeds, MD;  Location: [place] CV LAB;  Service: Cardiovascular;  Laterality: N/A;  . TONSILLECTOMY    . TOTAL KNEE ARTHROPLASTY Right 08/02/2015   Procedure: TOTAL KNEE ARTHROPLASTY;  Surgeon: Renette Butters, MD;  Location: [place];  Service: Orthopedics;  Laterality: Right;  . VENOGRAM N/A 01/26/2013   Procedure: VENOGRAM;  Surgeon: Angelia Mould, MD;  Location: [facility] CATH LAB;  Service: Cardiovascular;  Laterality: N/A;    reports that he quit smoking about 11 years ago. His smoking use included Cigarettes. He has a 25.00 pack-year smoking history. He quit smokeless tobacco use about 11 years ago. He reports that he does not drink alcohol or use drugs. family history includes Diabetes in his father; Healthy in his child, child, and child; Heart disease in his father and sister; Hypertension in his father; Kidney failure in his father; Thyroid nodules in his sister. Allergies  Allergen Reactions  . Cephalexin Swelling and Other (See Comments)    Tongue swelling  . Statins Other (See Comments)    Weak muscles  . Ciprofloxacin Rash   Current Outpatient Prescriptions on File Prior to Visit  Medication Sig Dispense Refill  .  acetaminophen (TYLENOL) 325 MG tablet Take 650 mg by mouth every 6 (six) hours as needed for mild pain or headache.     . allopurinol (ZYLOPRIM) 100 MG tablet Take 1 tablet (100 mg total) by mouth daily. 90 tablet 3  . aspirin 81 MG tablet Take 81 mg by mouth daily.    . citalopram (CELEXA) 10 MG tablet Take 10 mg by mouth 3 (three) times a week.    . clopidogrel (PLAVIX) 75 MG tablet Take 1 tablet (75 mg total) by mouth daily with breakfast. 30 tablet 6  . colesevelam (WELCHOL) 625 MG tablet TAKE THREE TABLETS BY MOUTH TWICE DAILY WITH MEALS 180 tablet 11  . ezetimibe (ZETIA) 10 MG tablet Take 1 tablet (10 mg total) by mouth daily. 90 tablet 3  .  gabapentin (NEURONTIN) 300 MG capsule 1-2 tabs by mouth at bedtime for sleep and pain 180 capsule 3  . HYDROcodone-acetaminophen (NORCO/VICODIN) 5-325 MG tablet Take 1 tablet by mouth every 6 (six) hours as needed for moderate pain.    . Lanthanum Carbonate 1000 MG PACK Take 3 packets by mouth 2 (two) times daily.     . methimazole (TAPAZOLE) 5 MG tablet Take 1 tablet (5 mg total) by mouth daily. 30 tablet 11  . metoprolol tartrate (LOPRESSOR) 25 MG tablet Take 1 tablet (25 mg total) by mouth 2 (two) times daily. ON NON-HD DAYS 144 tablet 3  . midodrine (PROAMATINE) 10 MG tablet Take 1 tablet (10 mg total) by mouth See admin instructions. Once a day only on dialysis days (Tues/Thurs/Sat) 30 tablet 0  . multivitamin (RENA-VIT) TABS tablet Take 1 tablet by mouth at bedtime. Reported on 10/24/2015    . pantoprazole (PROTONIX) 40 MG tablet Take 40 mg by mouth 2 (two) times daily.     . SENSIPAR 90 MG tablet Take 90 mg by mouth 2 (two) times daily.     No current facility-administered medications on file prior to visit.     Review of Systems All otherwise neg per pt     Objective:   Physical Exam BP 122/64   Pulse 70   Temp 98.9 F (37.2 C) (Oral)   Ht 6\' 1"  (1.854 m)   Wt 236 lb (107 kg)   SpO2 98%   BMI 31.14 kg/m  VS noted,  Constitutional: Pt appears in NAD HENT: Head: NCAT.  Right Ear: External ear normal.  Left Ear: External ear normal.  Eyes: . Pupils are equal, round, and reactive to light. Conjunctivae and EOM are normal Nose: without d/c or deformity Neck: Neck supple. Gross normal ROM Cardiovascular: Normal rate and regular rhythm.   Pulmonary/Chest: Effort normal and breath sounds without rales or wheezing.  Neurological: Pt is alert. At baseline orientation, motor grossly intact Skin: Skin is warm. No rashes, other new lesions, no LE edema Psychiatric: Pt behavior is normal without agitation  No other exam findings    Assessment & Plan:

## 2017-01-10 ENCOUNTER — Ambulatory Visit: Payer: Medicare Other

## 2017-01-10 DIAGNOSIS — N2581 Secondary hyperparathyroidism of renal origin: Secondary | ICD-10-CM | POA: Diagnosis not present

## 2017-01-10 DIAGNOSIS — D631 Anemia in chronic kidney disease: Secondary | ICD-10-CM | POA: Diagnosis not present

## 2017-01-10 DIAGNOSIS — E119 Type 2 diabetes mellitus without complications: Secondary | ICD-10-CM | POA: Diagnosis not present

## 2017-01-10 DIAGNOSIS — N186 End stage renal disease: Secondary | ICD-10-CM | POA: Diagnosis not present

## 2017-01-10 DIAGNOSIS — Z23 Encounter for immunization: Secondary | ICD-10-CM | POA: Diagnosis not present

## 2017-01-10 LAB — CBC WITH DIFFERENTIAL/PLATELET
BASOS PCT: 1.5 % (ref 0.0–3.0)
Basophils Absolute: 0.1 10*3/uL (ref 0.0–0.1)
EOS ABS: 0.1 10*3/uL (ref 0.0–0.7)
Eosinophils Relative: 1.6 % (ref 0.0–5.0)
HCT: 36.1 % — ABNORMAL LOW (ref 39.0–52.0)
HEMOGLOBIN: 11.2 g/dL — AB (ref 13.0–17.0)
LYMPHS ABS: 1.4 10*3/uL (ref 0.7–4.0)
Lymphocytes Relative: 18.1 % (ref 12.0–46.0)
MCHC: 31.1 g/dL (ref 30.0–36.0)
MCV: 90.5 fl (ref 78.0–100.0)
MONO ABS: 1.1 10*3/uL — AB (ref 0.1–1.0)
Monocytes Relative: 13.7 % — ABNORMAL HIGH (ref 3.0–12.0)
NEUTROS ABS: 5 10*3/uL (ref 1.4–7.7)
NEUTROS PCT: 65.1 % (ref 43.0–77.0)
PLATELETS: 321 10*3/uL (ref 150.0–400.0)
RBC: 3.98 Mil/uL — ABNORMAL LOW (ref 4.22–5.81)
RDW: 20.9 % — AB (ref 11.5–15.5)
WBC: 7.7 10*3/uL (ref 4.0–10.5)

## 2017-01-11 ENCOUNTER — Telehealth (HOSPITAL_COMMUNITY): Payer: Self-pay | Admitting: Internal Medicine

## 2017-01-11 ENCOUNTER — Encounter (HOSPITAL_COMMUNITY): Admission: RE | Admit: 2017-01-11 | Payer: Medicare Other | Source: Ambulatory Visit

## 2017-01-11 ENCOUNTER — Ambulatory Visit: Payer: Self-pay

## 2017-01-12 DIAGNOSIS — N2581 Secondary hyperparathyroidism of renal origin: Secondary | ICD-10-CM | POA: Diagnosis not present

## 2017-01-12 DIAGNOSIS — E119 Type 2 diabetes mellitus without complications: Secondary | ICD-10-CM | POA: Diagnosis not present

## 2017-01-12 DIAGNOSIS — D631 Anemia in chronic kidney disease: Secondary | ICD-10-CM | POA: Diagnosis not present

## 2017-01-12 DIAGNOSIS — N186 End stage renal disease: Secondary | ICD-10-CM | POA: Diagnosis not present

## 2017-01-12 DIAGNOSIS — Z23 Encounter for immunization: Secondary | ICD-10-CM | POA: Diagnosis not present

## 2017-01-12 NOTE — Assessment & Plan Note (Signed)
Etiology unclear, Exam otherwise benign, to check labs as documented, follow with expectant management  

## 2017-01-12 NOTE — Assessment & Plan Note (Signed)
stable overall by history and exam, and pt to continue medical treatment as before,  to f/u any worsening symptoms or concerns 

## 2017-01-14 ENCOUNTER — Encounter (HOSPITAL_COMMUNITY): Payer: Self-pay | Admitting: Internal Medicine

## 2017-01-14 ENCOUNTER — Ambulatory Visit (HOSPITAL_COMMUNITY)
Admission: RE | Admit: 2017-01-14 | Discharge: 2017-01-14 | Disposition: A | Discharge status: Home / Self Care | Payer: Medicare Other | Source: Ambulatory Visit | Attending: Internal Medicine | Admitting: Internal Medicine

## 2017-01-14 ENCOUNTER — Encounter (HOSPITAL_COMMUNITY): Payer: Medicare Other | Admitting: Internal Medicine

## 2017-01-14 VITALS — BP 170/80 | HR 94 | Wt 235.8 lb

## 2017-01-14 DIAGNOSIS — J449 Chronic obstructive pulmonary disease, unspecified: Secondary | ICD-10-CM | POA: Insufficient documentation

## 2017-01-14 DIAGNOSIS — I255 Ischemic cardiomyopathy: Secondary | ICD-10-CM | POA: Insufficient documentation

## 2017-01-14 DIAGNOSIS — G629 Polyneuropathy, unspecified: Secondary | ICD-10-CM | POA: Diagnosis not present

## 2017-01-14 DIAGNOSIS — N2581 Secondary hyperparathyroidism of renal origin: Secondary | ICD-10-CM | POA: Diagnosis not present

## 2017-01-14 DIAGNOSIS — E0590 Thyrotoxicosis, unspecified without thyrotoxic crisis or storm: Secondary | ICD-10-CM | POA: Insufficient documentation

## 2017-01-14 DIAGNOSIS — I132 Hypertensive heart and chronic kidney disease with heart failure and with stage 5 chronic kidney disease, or end stage renal disease: Secondary | ICD-10-CM | POA: Insufficient documentation

## 2017-01-14 DIAGNOSIS — E1122 Type 2 diabetes mellitus with diabetic chronic kidney disease: Secondary | ICD-10-CM | POA: Insufficient documentation

## 2017-01-14 DIAGNOSIS — I5032 Chronic diastolic (congestive) heart failure: Secondary | ICD-10-CM

## 2017-01-14 DIAGNOSIS — Z9981 Dependence on supplemental oxygen: Secondary | ICD-10-CM | POA: Diagnosis not present

## 2017-01-14 DIAGNOSIS — N400 Enlarged prostate without lower urinary tract symptoms: Secondary | ICD-10-CM | POA: Diagnosis not present

## 2017-01-14 DIAGNOSIS — G4733 Obstructive sleep apnea (adult) (pediatric): Secondary | ICD-10-CM | POA: Insufficient documentation

## 2017-01-14 DIAGNOSIS — I5022 Chronic systolic (congestive) heart failure: Secondary | ICD-10-CM | POA: Diagnosis not present

## 2017-01-14 DIAGNOSIS — Z7982 Long term (current) use of aspirin: Secondary | ICD-10-CM | POA: Insufficient documentation

## 2017-01-14 DIAGNOSIS — Z79899 Other long term (current) drug therapy: Secondary | ICD-10-CM | POA: Insufficient documentation

## 2017-01-14 DIAGNOSIS — I214 Non-ST elevation (NSTEMI) myocardial infarction: Secondary | ICD-10-CM | POA: Diagnosis not present

## 2017-01-14 DIAGNOSIS — E785 Hyperlipidemia, unspecified: Secondary | ICD-10-CM | POA: Insufficient documentation

## 2017-01-14 DIAGNOSIS — K219 Gastro-esophageal reflux disease without esophagitis: Secondary | ICD-10-CM | POA: Insufficient documentation

## 2017-01-14 DIAGNOSIS — F329 Major depressive disorder, single episode, unspecified: Secondary | ICD-10-CM | POA: Diagnosis not present

## 2017-01-14 DIAGNOSIS — I2510 Atherosclerotic heart disease of native coronary artery without angina pectoris: Secondary | ICD-10-CM | POA: Diagnosis not present

## 2017-01-14 DIAGNOSIS — Z992 Dependence on renal dialysis: Secondary | ICD-10-CM | POA: Diagnosis not present

## 2017-01-14 DIAGNOSIS — Z8546 Personal history of malignant neoplasm of prostate: Secondary | ICD-10-CM | POA: Insufficient documentation

## 2017-01-14 DIAGNOSIS — E669 Obesity, unspecified: Secondary | ICD-10-CM | POA: Insufficient documentation

## 2017-01-14 DIAGNOSIS — I252 Old myocardial infarction: Secondary | ICD-10-CM | POA: Insufficient documentation

## 2017-01-14 DIAGNOSIS — I471 Supraventricular tachycardia: Secondary | ICD-10-CM | POA: Diagnosis not present

## 2017-01-14 DIAGNOSIS — F0390 Unspecified dementia without behavioral disturbance: Secondary | ICD-10-CM | POA: Insufficient documentation

## 2017-01-14 DIAGNOSIS — M109 Gout, unspecified: Secondary | ICD-10-CM | POA: Diagnosis not present

## 2017-01-14 DIAGNOSIS — I10 Essential (primary) hypertension: Secondary | ICD-10-CM

## 2017-01-14 DIAGNOSIS — N186 End stage renal disease: Secondary | ICD-10-CM | POA: Diagnosis not present

## 2017-01-14 NOTE — Patient Instructions (Signed)
We will contact you in 6 months to schedule your next appointment.  

## 2017-01-14 NOTE — Progress Notes (Signed)
Advanced Heart Failure Clinic Note   Patient ID: JSAON YOO, male   DOB: 1951-09-18, 66 y.o.   MRN: [id number]  PCP: Dr [name] Nephrologist: Dr Mercy Moore HF: Dr. [name]   HPI: [name] is 66 year old male with PMH: obesity, DM, COPD on nighttime oxygen, sleep apnea on CPAP, ESRD- HD, CAD, S/P Taxus drug-eluting stent to the right coronary in September 3300, chronic systolic/diastolic heart failure (Unable to tolerate statins so placed on Welchol), prostate CA s/p XRT   Had RHC in 2/11 which showed low pressures (PCWP = 2) , PA 26/16 (22)  and normal cardiac output.  Echo 12/12: EF 55% Myoview 10/15/12: EF 33%. LV Wall Motion: There is global hypokinesis. The LV is markedly enlarged. Small fixed apical defect.  cMRI 02/19/13: EF 37% dilated LV. diffuse HK Echo 6/15: EF 25-30%  Echo 4/16: EF 50-55%  Evaluated at [facility] for kidney transplant but he was not felt to be candidate (11/2012) due to poor mobility, DM, and coronary disease.   CPX 06/04/13   Resting HR: 76 Peak HR: 109 (70% age predicted max HR) BP rest: 107/70 BP peak: 154/69 Peak VO2: 11.8 (53.7% predicted peak VO2) - when corrected to ibw pVO2 15.7 VE/VCO2 slope: 28.8 OUES: 1.90 Peak RER: 1.06  Ve/MVV 34.7%itted    In 11/17 had NSTEMI underwent PCI LAD. RCA had long 80-90% but felt to be not amenable to PCI due to tortuosity. Admitted in 1/18 with SVT and small NSTEMI. Cath films reviewed again with Dr. Martinique and RCA not approachable percutaneously.   Admitted 12/27/16 after transient chest pain and troponin-T came back mildly elevated. Troponin-I normal in ED here and thought stable for home. Pt thought he actually may have had a panic attack.   He presents today for post hospital follow up.  Still having problems with his pressure dropping during dialysis days. Recent switched from coreg to Lopressor, only taking on non-dialysis days. He feels bad when his SBP gets below 100 on dialysis days, and will usually have them  stopped. His primary complaint is fatigue. Doing rehab and feels like it's helping. Edema controlled with HD. No further CVT. No further CP.   Echo 10/17/16 LVEF 40-45%, Grade 2 DD. Trivial MR, Mild LAE, Normal RV.  Past Medical History:  Diagnosis Date  . Allergic rhinitis, cause unspecified 02/24/2014  . Anemia 06/16/2011  . BENIGN PROSTATIC HYPERTROPHY 10/14/2009  . CAD, NATIVE VESSEL 02/06/2009   saw Dr. Missy Sabins last jan 2013  . Cervical radiculopathy, chronic 02/23/2016   Right c5-6 by NCS/EMG  . CHEST PAIN 03/29/2010  . COLONIC POLYPS, HX OF 10/14/2009  . CONGESTIVE HEART FAILURE 06/18/2007  . Dementia [id number]  . DEPRESSION 10/14/2009  . Depression 09/24/2015  . DIABETES MELLITUS, TYPE II 02/01/2010  . DIASTOLIC HEART FAILURE, CHRONIC 02/06/2009  . DIZZINESS 07/17/2010  . DYSLIPIDEMIA 06/18/2007  . DYSPNEA 10/29/2008  . ESRD (end stage renal disease) on dialysis ([place]) 08/04/2010   "TTS;  " (04/18/2015)  . FOOT PAIN 08/12/2008  . GAIT DISTURBANCE 03/03/2010  . GASTROENTERITIS, VIRAL 10/14/2009  . GERD 06/18/2007  . GOITER, MULTINODULAR 12/26/2007  . GOUT 06/18/2007  . GYNECOMASTIA 07/17/2010  . Hemodialysis access, fistula mature [facility])    Dialysis T-Th-Sa ([place]) Right upper arm fistula  . Hyperlipidemia 10/16/2011  . Hyperparathyroidism, secondary ([place]) 06/16/2011  . HYPERTENSION 06/18/2007  . Hyperthyroidism   . HYPERTHYROIDISM 02/02/2010  . Hypocalcemia 06/07/2010  . Ischemic cardiomyopathy 06/16/2011  . NECK PAIN 07/31/2010  . NSTEMI (  non-ST elevated myocardial infarction) ([place]) 08/04/2016  . ONYCHOMYCOSIS, TOENAILS 12/26/2007  . OSA on CPAP 10/16/2011  . Other malaise and fatigue 11/24/2009  . PERIPHERAL NEUROPATHY 06/18/2007  . Prostate cancer ([place])   . PULMONARY NODULE, RIGHT LOWER LOBE 06/08/2009  . Renal insufficiency   . Sleep apnea    cpap machine and o2  . TRANSAMINASES, SERUM, ELEVATED 02/01/2010  . Transfusion history    none recent  . Unspecified  hypotension 01/30/2010    Current Outpatient Prescriptions  Medication Sig Dispense Refill  . acetaminophen (TYLENOL) 325 MG tablet Take 650 mg by mouth every 6 (six) hours as needed for mild pain or headache.     . allopurinol (ZYLOPRIM) 100 MG tablet Take 1 tablet (100 mg total) by mouth daily. 90 tablet 3  . aspirin 81 MG tablet Take 81 mg by mouth daily.    . citalopram (CELEXA) 10 MG tablet Take 10 mg by mouth 3 (three) times a week.    . clopidogrel (PLAVIX) 75 MG tablet Take 1 tablet (75 mg total) by mouth daily with breakfast. 30 tablet 6  . colesevelam (WELCHOL) 625 MG tablet TAKE THREE TABLETS BY MOUTH TWICE DAILY WITH MEALS 180 tablet 11  . ezetimibe (ZETIA) 10 MG tablet Take 1 tablet (10 mg total) by mouth daily. 90 tablet 3  . gabapentin (NEURONTIN) 300 MG capsule 1-2 tabs by mouth at bedtime for sleep and pain 180 capsule 3  . HYDROcodone-acetaminophen (NORCO/VICODIN) 5-325 MG tablet Take 1 tablet by mouth every 6 (six) hours as needed for moderate pain.    . Lanthanum Carbonate 1000 MG PACK Take 3 packets by mouth 2 (two) times daily.     . methimazole (TAPAZOLE) 5 MG tablet Take 1 tablet (5 mg total) by mouth daily. 30 tablet 11  . metoprolol tartrate (LOPRESSOR) 25 MG tablet Take 1 tablet (25 mg total) by mouth 2 (two) times daily. ON NON-HD DAYS 144 tablet 3  . midodrine (PROAMATINE) 10 MG tablet Take 1 tablet (10 mg total) by mouth See admin instructions. Once a day only on dialysis days (Tues/Thurs/Sat) 30 tablet 0  . multivitamin (RENA-VIT) TABS tablet Take 1 tablet by mouth at bedtime. Reported on 10/24/2015    . pantoprazole (PROTONIX) 40 MG tablet Take 40 mg by mouth 2 (two) times daily.     . SENSIPAR 90 MG tablet Take 90 mg by mouth 2 (two) times daily.     No current facility-administered medications for this encounter.     Vitals:   01/14/17 1428  BP: (!) 170/80  Pulse: 94    PHYSICAL EXAM: General: Elderly and fatigued appearing AA male in NAD. Wife present.    HEENT: Normal Neck: Supple. JVP 8-9 cm. Carotids 2+ bilaterally; no bruits. No thyromegaly or nodule noted.  Cor: PMI non-displaced. RRR. No rubs or gallops. 2/6 SEM heard best at LUSB.  Lungs: CTAB, normal effort Abdomen: Obese, soft, NT, ND, no HSM. No bruits or masses. +BS  Extremities: No cyanosis, clubbing, or rash.  Trace to 1+ peripheral edema.   Neuro: Alert & oriented x 3. Cranial nerves grossly intact. Moves all 4 extremities w/o difficulty. Affect flat, but appropriate   ASSESSMENT & PLAN:  1. Chronic Systolic Heart Fatigue:  Mixed ICM/NICM, EF 37% per c-MRI. EF 25-30% (Echo 6/15) Most recent EF 40-45% in 1/18 -- Chronic NYHA Class III symptoms -- Volume status overall well managed by HD.  2. CAD --s/p PCI of LAD 11/17.  -- Recurrent NSTEMI  in 1/18 due to SVT. Cath films reviewed has 80-90% long lesion in RCA but not amenable to PCI due to tortuosity -- Continue ASA. Has not been able to tolerate statins due to myositis. Continue Zetia and Welchol.  -- Continue CR. -- Continue plavix.  Needs at least 6 months interrupted before any holds considered for surgery.  3. HTN  -- Very labile. Having periods of low BP especially with HD.  -- Continue midodrine on HD days. -- Continue lopressor 25 mg BID -- Unable to make any changes currently with lability and hypotension during HD 4. ESRD --   HD Tues/Thur/Sat 5. PSVT -- Taking Lopressor 25 mg BID  -- s/p ablation for AVNRT on 11/26/16 with resolution of symptoms.  -- States he occasionally has rates into 130s prior to start CR. Unclear if this is right as he has walked in.  Instructed to ask for EKG if this occurs again.   RTC 6 months. Unable to adjust BP meds currently with lability. Continue lopressor on OFF HD days.   [name] Friar, PA-C  2:42 PM  Patient seen and examined with the above-signed Advanced Practice Provider and/or Housestaff. I personally reviewed laboratory data, imaging studies and relevant  notes. I independently examined the patient and formulated the important aspects of the plan. I have edited the note to reflect any of my changes or salient points. I have personally discussed the plan with the patient and/or family.  He is stable. No significant ischemic symptoms. S/p recent SVT ablation. HR occasionally fast at start of CR but slows down quickly. Suspect sinus tach. Fatigued by HD but tolerating ok. BP drops with HD so unable to adjust HF meds further. If needs carpal tunnel surgery can hold Plavix for a few days. I told him I would not clear him for TKR.   [name] Bickers, MD  11:24 PM

## 2017-01-15 DIAGNOSIS — E119 Type 2 diabetes mellitus without complications: Secondary | ICD-10-CM | POA: Diagnosis not present

## 2017-01-15 DIAGNOSIS — N2581 Secondary hyperparathyroidism of renal origin: Secondary | ICD-10-CM | POA: Diagnosis not present

## 2017-01-15 DIAGNOSIS — N186 End stage renal disease: Secondary | ICD-10-CM | POA: Diagnosis not present

## 2017-01-15 DIAGNOSIS — Z23 Encounter for immunization: Secondary | ICD-10-CM | POA: Diagnosis not present

## 2017-01-15 DIAGNOSIS — D631 Anemia in chronic kidney disease: Secondary | ICD-10-CM | POA: Diagnosis not present

## 2017-01-16 ENCOUNTER — Encounter (HOSPITAL_COMMUNITY)
Admission: RE | Admit: 2017-01-16 | Discharge: 2017-01-16 | Disposition: A | Discharge status: Home / Self Care | Payer: Medicare Other | Source: Ambulatory Visit | Attending: Internal Medicine | Admitting: Internal Medicine

## 2017-01-16 DIAGNOSIS — Z955 Presence of coronary angioplasty implant and graft: Secondary | ICD-10-CM | POA: Diagnosis not present

## 2017-01-16 DIAGNOSIS — Z48812 Encounter for surgical aftercare following surgery on the circulatory system: Secondary | ICD-10-CM | POA: Diagnosis not present

## 2017-01-16 DIAGNOSIS — I214 Non-ST elevation (NSTEMI) myocardial infarction: Secondary | ICD-10-CM | POA: Diagnosis not present

## 2017-01-17 DIAGNOSIS — E119 Type 2 diabetes mellitus without complications: Secondary | ICD-10-CM | POA: Diagnosis not present

## 2017-01-17 DIAGNOSIS — Z23 Encounter for immunization: Secondary | ICD-10-CM | POA: Diagnosis not present

## 2017-01-17 DIAGNOSIS — D631 Anemia in chronic kidney disease: Secondary | ICD-10-CM | POA: Diagnosis not present

## 2017-01-17 DIAGNOSIS — N2581 Secondary hyperparathyroidism of renal origin: Secondary | ICD-10-CM | POA: Diagnosis not present

## 2017-01-17 DIAGNOSIS — N186 End stage renal disease: Secondary | ICD-10-CM | POA: Diagnosis not present

## 2017-01-18 ENCOUNTER — Other Ambulatory Visit: Payer: Self-pay

## 2017-01-18 ENCOUNTER — Encounter (HOSPITAL_COMMUNITY)
Admission: RE | Admit: 2017-01-18 | Discharge: 2017-01-18 | Disposition: A | Discharge status: Home / Self Care | Payer: Medicare Other | Source: Ambulatory Visit | Attending: Internal Medicine | Admitting: Internal Medicine

## 2017-01-18 DIAGNOSIS — Z955 Presence of coronary angioplasty implant and graft: Secondary | ICD-10-CM

## 2017-01-18 DIAGNOSIS — Z48812 Encounter for surgical aftercare following surgery on the circulatory system: Secondary | ICD-10-CM | POA: Diagnosis not present

## 2017-01-18 DIAGNOSIS — I214 Non-ST elevation (NSTEMI) myocardial infarction: Secondary | ICD-10-CM

## 2017-01-18 NOTE — Patient Outreach (Signed)
[facility]) Care Management  01/18/2017  [name] Nov 28, 1950 [id number]  EMMI heart failure EMMI heart failure red alert Day #3 REFERRAL DATE: 01/04/17 REFERRAL REASON: Weighed themselves today: NO CONSENT; Patient gave verbal consent to speak with his wife, Josep Luviano regarding all of his personal health information.  Telephone call to patient regarding EMMI heart failure. Unable to reach patient. HIPAA compliant voice message left with call back phone number.   PLAN: RNCM will attempt 2nd telephone outreach to patient within 1 week.   Dhanvin Szeto RN,BSN,CCM [facility] Telephonic  [phone]

## 2017-01-19 DIAGNOSIS — Z23 Encounter for immunization: Secondary | ICD-10-CM | POA: Diagnosis not present

## 2017-01-19 DIAGNOSIS — D631 Anemia in chronic kidney disease: Secondary | ICD-10-CM | POA: Diagnosis not present

## 2017-01-19 DIAGNOSIS — N186 End stage renal disease: Secondary | ICD-10-CM | POA: Diagnosis not present

## 2017-01-19 DIAGNOSIS — N2581 Secondary hyperparathyroidism of renal origin: Secondary | ICD-10-CM | POA: Diagnosis not present

## 2017-01-19 DIAGNOSIS — E119 Type 2 diabetes mellitus without complications: Secondary | ICD-10-CM | POA: Diagnosis not present

## 2017-01-21 ENCOUNTER — Encounter (HOSPITAL_COMMUNITY)
Admission: RE | Admit: 2017-01-21 | Discharge: 2017-01-21 | Disposition: A | Discharge status: Home / Self Care | Payer: Medicare Other | Source: Ambulatory Visit | Attending: Internal Medicine | Admitting: Internal Medicine

## 2017-01-21 ENCOUNTER — Other Ambulatory Visit: Payer: Self-pay

## 2017-01-21 DIAGNOSIS — Z955 Presence of coronary angioplasty implant and graft: Secondary | ICD-10-CM

## 2017-01-21 DIAGNOSIS — I214 Non-ST elevation (NSTEMI) myocardial infarction: Secondary | ICD-10-CM

## 2017-01-21 DIAGNOSIS — R112 Nausea with vomiting, unspecified: Secondary | ICD-10-CM | POA: Diagnosis not present

## 2017-01-21 DIAGNOSIS — Z48812 Encounter for surgical aftercare following surgery on the circulatory system: Secondary | ICD-10-CM | POA: Diagnosis not present

## 2017-01-21 NOTE — Patient Outreach (Addendum)
[facility]) Care Management  01/21/2017  [name] 12/12/1950 [id number]  EMMI heart failure EMMI heart failure red alert Day #3 REFERRAL DATE: 01/04/17 REFERRAL REASON: Weighed themselves today: NO CONSENT; Patient gave verbal consent to speak with his wife, [name] regarding all of his personal health information.  Telephone call to patient / wife regarding EMMI heart failure program. Unable to reach. HIPAA compliant voice message left with call back phone number.   PLAN; RNCM will attempt 3rd telephone call to patient within 1 week.   Kelii Chittum RN,BSN,CCM [facility] Telephonic  [phone]

## 2017-01-22 ENCOUNTER — Other Ambulatory Visit: Payer: Self-pay

## 2017-01-22 DIAGNOSIS — Z23 Encounter for immunization: Secondary | ICD-10-CM | POA: Diagnosis not present

## 2017-01-22 DIAGNOSIS — N2581 Secondary hyperparathyroidism of renal origin: Secondary | ICD-10-CM | POA: Diagnosis not present

## 2017-01-22 DIAGNOSIS — D631 Anemia in chronic kidney disease: Secondary | ICD-10-CM | POA: Diagnosis not present

## 2017-01-22 DIAGNOSIS — N186 End stage renal disease: Secondary | ICD-10-CM | POA: Diagnosis not present

## 2017-01-22 DIAGNOSIS — E119 Type 2 diabetes mellitus without complications: Secondary | ICD-10-CM | POA: Diagnosis not present

## 2017-01-22 NOTE — Patient Outreach (Signed)
[name] Care Management  01/22/2017  [name] 08/23/1951 [id number]  EMMI heart failure REFERRAL DATE: 01/04/17 REFERRAL REASON: EMMI follow up outreach CONSENT; Patient gave verbal consent to speak with his wife, [name] regarding all of his personal health information.  Third telephone call to patient regarding EMMI heart failure follow up.  Unable to reach patient. HIPAA compliant voice message left with call back phone number.    PLAN; RNCM will send patient outreach letter to attempt contact.  Dwyane Dupree RN,BSN,CCM [facility] Telephonic  [phone]

## 2017-01-23 ENCOUNTER — Encounter (HOSPITAL_COMMUNITY)
Admission: RE | Admit: 2017-01-23 | Discharge: 2017-01-23 | Disposition: A | Discharge status: Home / Self Care | Payer: Medicare Other | Source: Ambulatory Visit | Attending: Internal Medicine | Admitting: Internal Medicine

## 2017-01-23 ENCOUNTER — Ambulatory Visit (INDEPENDENT_AMBULATORY_CARE_PROVIDER_SITE_OTHER): Payer: Medicare Other | Admitting: Endocrinology

## 2017-01-23 ENCOUNTER — Encounter: Payer: Self-pay | Admitting: Endocrinology

## 2017-01-23 VITALS — BP 148/88 | HR 88 | Ht 73.0 in | Wt 224.6 lb

## 2017-01-23 DIAGNOSIS — I214 Non-ST elevation (NSTEMI) myocardial infarction: Secondary | ICD-10-CM

## 2017-01-23 DIAGNOSIS — Z48812 Encounter for surgical aftercare following surgery on the circulatory system: Secondary | ICD-10-CM | POA: Diagnosis not present

## 2017-01-23 DIAGNOSIS — E1121 Type 2 diabetes mellitus with diabetic nephropathy: Secondary | ICD-10-CM

## 2017-01-23 DIAGNOSIS — Z955 Presence of coronary angioplasty implant and graft: Secondary | ICD-10-CM | POA: Diagnosis not present

## 2017-01-23 DIAGNOSIS — I2510 Atherosclerotic heart disease of native coronary artery without angina pectoris: Secondary | ICD-10-CM

## 2017-01-23 LAB — POCT GLYCOSYLATED HEMOGLOBIN (HGB A1C): HEMOGLOBIN A1C: 6.5

## 2017-01-23 NOTE — Progress Notes (Signed)
Subjective:    Patient ID: [name], male    DOB: 23-Mar-1951, 66 y.o.   MRN: [id number]  HPI Pt has diabetes mellitus:  DM type: 2 Dx'ed: 3557 Complications: polyneuropathy, renal failure, and CAD.  Therapy: welchol DKA: never.  Severe hypoglycemia: never.  Pancreatitis: never.  Other: med needs have decreased with worsening of renal function; fructosamine converts to a1c approx 0.5% lower than a1c itself.   Interval history:  no cbg record, but states cbg's vary from 90-120.  There is no trend throughout the day.  He denies hypoglycemia.   Pt also has Hyperthyroidism (due to a multinodular goiter; dx'ed 2009; bx then was benign; nuc med scan showed very low uptake, so tapazole was chosen as rx).  He takes tapazole as rx'ed. Past Medical History:  Diagnosis Date  . Allergic rhinitis, cause unspecified 02/24/2014  . Anemia 06/16/2011  . BENIGN PROSTATIC HYPERTROPHY 10/14/2009  . CAD, NATIVE VESSEL 02/06/2009   saw Dr. Missy Sabins last jan 2013  . Cervical radiculopathy, chronic 02/23/2016   Right c5-6 by NCS/EMG  . CHEST PAIN 03/29/2010  . COLONIC POLYPS, HX OF 10/14/2009  . CONGESTIVE HEART FAILURE 06/18/2007  . Dementia [id number]  . DEPRESSION 10/14/2009  . Depression 09/24/2015  . DIABETES MELLITUS, TYPE II 02/01/2010  . DIASTOLIC HEART FAILURE, CHRONIC 02/06/2009  . DIZZINESS 07/17/2010  . DYSLIPIDEMIA 06/18/2007  . DYSPNEA 10/29/2008  . ESRD (end stage renal disease) on dialysis ([place]) 08/04/2010   "TTS;  " (04/18/2015)  . FOOT PAIN 08/12/2008  . GAIT DISTURBANCE 03/03/2010  . GASTROENTERITIS, VIRAL 10/14/2009  . GERD 06/18/2007  . GOITER, MULTINODULAR 12/26/2007  . GOUT 06/18/2007  . GYNECOMASTIA 07/17/2010  . Hemodialysis access, fistula mature [facility])    Dialysis T-Th-Sa ([place]) Right upper arm fistula  . Hyperlipidemia 10/16/2011  . Hyperparathyroidism, secondary ([place]) 06/16/2011  . HYPERTENSION 06/18/2007  . Hyperthyroidism   . HYPERTHYROIDISM 02/02/2010  .  Hypocalcemia 06/07/2010  . Ischemic cardiomyopathy 06/16/2011  . NECK PAIN 07/31/2010  . NSTEMI (non-ST elevated myocardial infarction) ([place]) 08/04/2016  . ONYCHOMYCOSIS, TOENAILS 12/26/2007  . OSA on CPAP 10/16/2011  . Other malaise and fatigue 11/24/2009  . PERIPHERAL NEUROPATHY 06/18/2007  . Prostate cancer ([place])   . PULMONARY NODULE, RIGHT LOWER LOBE 06/08/2009  . Renal insufficiency   . Sleep apnea    cpap machine and o2  . TRANSAMINASES, SERUM, ELEVATED 02/01/2010  . Transfusion history    none recent  . Unspecified hypotension 01/30/2010    Past Surgical History:  Procedure Laterality Date  . ARTERIOVENOUS GRAFT PLACEMENT Right 2009   forearm/notes 02/01/2011  . AV FISTULA PLACEMENT  11/07/2011   Procedure: INSERTION OF ARTERIOVENOUS (AV) GORE-TEX GRAFT ARM;  Surgeon: Tinnie Gens, MD;  Location: [facility];  Service: Vascular;  Laterality: Left;  . BACK SURGERY  1998  . BASCILIC VEIN TRANSPOSITION Right 02/27/2013   Procedure: BASCILIC VEIN TRANSPOSITION;  Surgeon: Mal Misty, MD;  Location: [place];  Service: Vascular;  Laterality: Right;  Right Basilic Vein Transposition   . CARDIAC CATHETERIZATION N/A 08/06/2016   Procedure: Left Heart Cath and Coronary Angiography;  Surgeon: Jolaine Artist, MD;  Location: [place] CV LAB;  Service: Cardiovascular;  Laterality: N/A;  . CARDIAC CATHETERIZATION N/A 08/07/2016   Procedure: Coronary/Graft Atherectomy-CSI LAD;  Surgeon: Peter M Martinique, MD;  Location: [place] CV LAB;  Service: Cardiovascular;  Laterality: N/A;  . CERVICAL SPINE SURGERY  2/09   "to repair nerve problems in my  left arm"  . CHOLECYSTECTOMY    . CORONARY ANGIOPLASTY WITH STENT PLACEMENT  06/11/2008  . CORONARY ANGIOPLASTY WITH STENT PLACEMENT  06/2007   TAXUS stent to RCA/notes 01/31/2011  . ESOPHAGOGASTRODUODENOSCOPY  09/28/2011   Procedure: ESOPHAGOGASTRODUODENOSCOPY (EGD);  Surgeon: Missy Sabins, MD;  Location: [facility] ENDOSCOPY;  Service: Endoscopy;  Laterality: N/A;  .  ESOPHAGOGASTRODUODENOSCOPY N/A 04/07/2015   Procedure: ESOPHAGOGASTRODUODENOSCOPY (EGD);  Surgeon: Teena Irani, MD;  Location: [facility];  Service: Endoscopy;  Laterality: N/A;  . ESOPHAGOGASTRODUODENOSCOPY N/A 04/19/2015   Procedure: ESOPHAGOGASTRODUODENOSCOPY (EGD);  Surgeon: Arta Silence, MD;  Location: [facility] ENDOSCOPY;  Service: Endoscopy;  Laterality: N/A;  . FOREIGN BODY REMOVAL  09/2003   via upper endoscopy/notes 02/12/2011  . GIVENS CAPSULE STUDY  09/30/2011   Procedure: GIVENS CAPSULE STUDY;  Surgeon: Jeryl Columbia, MD;  Location: [facility] ENDOSCOPY;  Service: Endoscopy;  Laterality: N/A;  . INSERTION OF DIALYSIS CATHETER Right 2014  . INSERTION OF DIALYSIS CATHETER Left 02/11/2013   Procedure: INSERTION OF DIALYSIS CATHETER;  Surgeon: Conrad [place], MD;  Location: [place];  Service: Vascular;  Laterality: Left;  Ultrasound guided  . REMOVAL OF A DIALYSIS CATHETER Right 02/11/2013   Procedure: REMOVAL OF A DIALYSIS CATHETER;  Surgeon: Conrad [place], MD;  Location: [facility];  Service: Vascular;  Laterality: Right;  . SAVORY DILATION N/A 04/07/2015   Procedure: SAVORY DILATION;  Surgeon: Teena Irani, MD;  Location: [facility] ENDOSCOPY;  Service: Endoscopy;  Laterality: N/A;  . SHUNTOGRAM N/A 09/20/2011   Procedure: Earney Mallet;  Surgeon: Conrad Kalaeloa, MD;  Location: [facility] CATH LAB;  Service: Cardiovascular;  Laterality: N/A;  . SVT ABLATION N/A 11/26/2016   Procedure: SVT Ablation;  Surgeon: Will Meredith Leeds, MD;  Location: [place] CV LAB;  Service: Cardiovascular;  Laterality: N/A;  . TONSILLECTOMY    . TOTAL KNEE ARTHROPLASTY Right 08/02/2015   Procedure: TOTAL KNEE ARTHROPLASTY;  Surgeon: Renette Butters, MD;  Location: [place];  Service: Orthopedics;  Laterality: Right;  . VENOGRAM N/A 01/26/2013   Procedure: VENOGRAM;  Surgeon: Angelia Mould, MD;  Location: [facility] CATH LAB;  Service: Cardiovascular;  Laterality: N/A;    Social History   Social History  . Marital status: Married    Spouse name: N/A    . Number of children: 3  . Years of education: N/A   Occupational History  . 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    Social History Main Topics  . Smoking status: Former Smoker    Packs/day: 1.00    Years: 25.00    Types: Cigarettes    Quit date: 10/01/2005  . Smokeless tobacco: Former Systems developer    Quit date: 10/01/2005     Comment: Quit smoking 2007 Smoked x 25 years 1/2 ppd.  . Alcohol use No  . Drug use: No  . Sexual activity: No   Other Topics Concern  . Not on file   Social History Narrative  . No narrative on file    Current Outpatient Prescriptions on File Prior to Visit  Medication Sig Dispense Refill  . acetaminophen (TYLENOL) 325 MG tablet Take 650 mg by mouth every 6 (six) hours as needed for mild pain or headache.     . allopurinol (ZYLOPRIM) 100 MG tablet Take 1 tablet (100 mg total) by mouth daily. 90 tablet 3  . aspirin 81 MG tablet Take 81 mg by mouth daily.    . citalopram (CELEXA) 10 MG tablet Take 10 mg by mouth 3 (  three) times a week.    . clopidogrel (PLAVIX) 75 MG tablet Take 1 tablet (75 mg total) by mouth daily with breakfast. 30 tablet 6  . colesevelam (WELCHOL) 625 MG tablet TAKE THREE TABLETS BY MOUTH TWICE DAILY WITH MEALS 180 tablet 11  . ezetimibe (ZETIA) 10 MG tablet Take 1 tablet (10 mg total) by mouth daily. 90 tablet 3  . gabapentin (NEURONTIN) 300 MG capsule 1-2 tabs by mouth at bedtime for sleep and pain 180 capsule 3  . HYDROcodone-acetaminophen (NORCO/VICODIN) 5-325 MG tablet Take 1 tablet by mouth every 6 (six) hours as needed for moderate pain.    . Lanthanum Carbonate 1000 MG PACK Take 3 packets by mouth 2 (two) times daily.     . methimazole (TAPAZOLE) 5 MG tablet Take 1 tablet (5 mg total) by mouth daily. 30 tablet 11  . metoprolol tartrate (LOPRESSOR) 25 MG tablet Take 1 tablet (25 mg total) by mouth 2 (two) times daily. ON NON-HD DAYS 144 tablet 3  . midodrine (PROAMATINE) 10 MG tablet Take 1 tablet (10 mg total)  by mouth See admin instructions. Once a day only on dialysis days (Tues/Thurs/Sat) 30 tablet 0  . multivitamin (RENA-VIT) TABS tablet Take 1 tablet by mouth at bedtime. Reported on 10/24/2015    . pantoprazole (PROTONIX) 40 MG tablet Take 40 mg by mouth 2 (two) times daily.     . SENSIPAR 90 MG tablet Take 90 mg by mouth 2 (two) times daily.     No current facility-administered medications on file prior to visit.     Allergies  Allergen Reactions  . Cephalexin Swelling and Other (See Comments)    Tongue swelling  . Statins Other (See Comments)    Weak muscles  . Ciprofloxacin Rash    Family History  Problem Relation Age of Onset  . Heart disease Sister   . Thyroid nodules Sister   . Heart disease Father   . Diabetes Father   . Kidney failure Father   . Hypertension Father   . Healthy Child   . Healthy Child   . Healthy Child   . Cancer Neg Hx     BP (!) 148/88   Pulse 88   Ht 6\' 1"  (1.854 m)   Wt 224 lb 9.6 oz (101.9 kg)   SpO2 92%   BMI 29.63 kg/m    Review of Systems Denies fever.      Objective:   Physical Exam VITAL SIGNS:  See vs page GENERAL: no distress.  Pulses: dorsalis pedis intact bilat.  MSK: no deformity of the feet.  CV: trace bilat leg edema.   Skin: no ulcer on the feet. normal color and temp on the feet.   Neuro: sensation is intact to touch on the feet, but decreased from normal.    Lab Results  Component Value Date   TSH 1.011 12/27/2016   T4TOTAL 5.6 07/19/2008   Lab Results  Component Value Date   HGBA1C 6.5 01/23/2017      Assessment & Plan:  Hyperthyroidism: well-controlled. Please continue the same medication Type 2 DM, with CAD: well-controlled.  Renal failure: this limits rx options.  HTN: recheck next time.  Patient Instructions  Please continue the same medications If ever you have fever while taking methimazole, stop it and call us, even if the reason is obvious, because of the risk of a rare side-effect. Please  come back for a follow-up appointment in 4 months.

## 2017-01-23 NOTE — Progress Notes (Signed)
Cardiac Individual Treatment Plan  Patient Details  Name: [name] MRN: [id number] Date of Birth: 12-Mar-1951 Referring Provider:     [place] from 10/04/2016 in [facility]  Referring Provider  Glori Bickers MD      Initial Encounter Date:    CARDIAC REHAB PHASE II ORIENTATION from 10/04/2016 in [facility]  Date  10/04/16  Referring Provider  Glori Bickers MD      Visit Diagnosis: 08/04/16 NSTEMI (non-ST elevated myocardial infarction) ([place])  08/04/16 Status post coronary artery stent placement  Patient's Home Medications on Admission:  Current Outpatient Prescriptions:  .  acetaminophen (TYLENOL) 325 MG tablet, Take 650 mg by mouth every 6 (six) hours as needed for mild pain or headache. , Disp: , Rfl:  .  allopurinol (ZYLOPRIM) 100 MG tablet, Take 1 tablet (100 mg total) by mouth daily., Disp: 90 tablet, Rfl: 3 .  aspirin 81 MG tablet, Take 81 mg by mouth daily., Disp: , Rfl:  .  citalopram (CELEXA) 10 MG tablet, Take 10 mg by mouth 3 (three) times a week., Disp: , Rfl:  .  clopidogrel (PLAVIX) 75 MG tablet, Take 1 tablet (75 mg total) by mouth daily with breakfast., Disp: 30 tablet, Rfl: 6 .  colesevelam (WELCHOL) 625 MG tablet, TAKE THREE TABLETS BY MOUTH TWICE DAILY WITH MEALS, Disp: 180 tablet, Rfl: 11 .  ezetimibe (ZETIA) 10 MG tablet, Take 1 tablet (10 mg total) by mouth daily., Disp: 90 tablet, Rfl: 3 .  gabapentin (NEURONTIN) 300 MG capsule, 1-2 tabs by mouth at bedtime for sleep and pain, Disp: 180 capsule, Rfl: 3 .  HYDROcodone-acetaminophen (NORCO/VICODIN) 5-325 MG tablet, Take 1 tablet by mouth every 6 (six) hours as needed for moderate pain., Disp: , Rfl:  .  Lanthanum Carbonate 1000 MG PACK, Take 3 packets by mouth 2 (two) times daily. , Disp: , Rfl:  .  methimazole (TAPAZOLE) 5 MG tablet, Take 1 tablet (5 mg total) by mouth daily., Disp: 30 tablet, Rfl: 11 .  metoprolol  tartrate (LOPRESSOR) 25 MG tablet, Take 1 tablet (25 mg total) by mouth 2 (two) times daily. ON NON-HD DAYS, Disp: 144 tablet, Rfl: 3 .  midodrine (PROAMATINE) 10 MG tablet, Take 1 tablet (10 mg total) by mouth See admin instructions. Once a day only on dialysis days (Tues/Thurs/Sat), Disp: 30 tablet, Rfl: 0 .  multivitamin (RENA-VIT) TABS tablet, Take 1 tablet by mouth at bedtime. Reported on 10/24/2015, Disp: , Rfl:  .  pantoprazole (PROTONIX) 40 MG tablet, Take 40 mg by mouth 2 (two) times daily. , Disp: , Rfl:  .  SENSIPAR 90 MG tablet, Take 90 mg by mouth 2 (two) times daily., Disp: , Rfl:   Past Medical History: Past Medical History:  Diagnosis Date  . Allergic rhinitis, cause unspecified 02/24/2014  . Anemia 06/16/2011  . BENIGN PROSTATIC HYPERTROPHY 10/14/2009  . CAD, NATIVE VESSEL 02/06/2009   saw Dr. Missy Sabins last jan 2013  . Cervical radiculopathy, chronic 02/23/2016   Right c5-6 by NCS/EMG  . CHEST PAIN 03/29/2010  . COLONIC POLYPS, HX OF 10/14/2009  . CONGESTIVE HEART FAILURE 06/18/2007  . Dementia [id number]  . DEPRESSION 10/14/2009  . Depression 09/24/2015  . DIABETES MELLITUS, TYPE II 02/01/2010  . DIASTOLIC HEART FAILURE, CHRONIC 02/06/2009  . DIZZINESS 07/17/2010  . DYSLIPIDEMIA 06/18/2007  . DYSPNEA 10/29/2008  . ESRD (end stage renal disease) on dialysis ([place]) 08/04/2010   "TTS;  " (04/18/2015)  .  FOOT PAIN 08/12/2008  . GAIT DISTURBANCE 03/03/2010  . GASTROENTERITIS, VIRAL 10/14/2009  . GERD 06/18/2007  . GOITER, MULTINODULAR 12/26/2007  . GOUT 06/18/2007  . GYNECOMASTIA 07/17/2010  . Hemodialysis access, fistula mature [facility])    Dialysis T-Th-Sa ([place]) Right upper arm fistula  . Hyperlipidemia 10/16/2011  . Hyperparathyroidism, secondary ([place]) 06/16/2011  . HYPERTENSION 06/18/2007  . Hyperthyroidism   . HYPERTHYROIDISM 02/02/2010  . Hypocalcemia 06/07/2010  . Ischemic cardiomyopathy 06/16/2011  . NECK PAIN 07/31/2010  . NSTEMI (non-ST elevated myocardial  infarction) ([place]) 08/04/2016  . ONYCHOMYCOSIS, TOENAILS 12/26/2007  . OSA on CPAP 10/16/2011  . Other malaise and fatigue 11/24/2009  . PERIPHERAL NEUROPATHY 06/18/2007  . Prostate cancer ([place])   . PULMONARY NODULE, RIGHT LOWER LOBE 06/08/2009  . Renal insufficiency   . Sleep apnea    cpap machine and o2  . TRANSAMINASES, SERUM, ELEVATED 02/01/2010  . Transfusion history    none recent  . Unspecified hypotension 01/30/2010    Tobacco Use: History  Smoking Status  . Former Smoker  . Packs/day: 1.00  . Years: 25.00  . Types: Cigarettes  . Quit date: 10/01/2005  Smokeless Tobacco  . Former Systems developer  . Quit date: 10/01/2005    Comment: Quit smoking 2007 Smoked x 25 years 1/2 ppd.    Labs: Recent Review Flowsheet Data    Labs for ITP Cardiac and Pulmonary Rehab Latest Ref Rng & Units 10/16/2016 10/16/2016 10/18/2016 10/24/2016 01/23/2017   Cholestrol 0 - 200 mg/dL - - 268(H) - -   LDLCALC 0 - 99 mg/dL - - 170(H) - -   LDLDIRECT mg/dL - - - - -   HDL >40 mg/dL - - 46 - -   Trlycerides <150 mg/dL - - 262(H) - -   Hemoglobin A1c - - - - 5.5 6.5   PHART 7.350 - 7.450 - - - - -   PCO2ART 35.0 - 45.0 mmHg - - - - -   HCO3 20.0 - 28.0 mmol/L 30.0(H) - - - -   TCO2 0 - 100 mmol/L 32 29 - - -   O2SAT % 79.0 - - - -      Capillary Blood Glucose: Lab Results  Component Value Date   GLUCAP 143 (H) 12/28/2016   GLUCAP 146 (H) 12/28/2016   GLUCAP 127 (H) 12/27/2016   GLUCAP 142 (H) 12/27/2016   GLUCAP 128 (H) 12/27/2016     Exercise Target Goals:    Exercise Program Goal: Individual exercise prescription set with THRR, safety & activity barriers. Participant demonstrates ability to understand and report RPE using BORG scale, to self-measure pulse accurately, and to acknowledge the importance of the exercise prescription.  Exercise Prescription Goal: Starting with aerobic activity 30 plus minutes a day, 3 days per week for initial exercise prescription. Provide home exercise prescription and  guidelines that participant acknowledges understanding prior to discharge.  Activity Barriers & Risk Stratification:     Activity Barriers & Cardiac Risk Stratification - 10/04/16 0858      Activity Barriers & Cardiac Risk Stratification   Activity Barriers Balance Concerns;History of Falls;Muscular Weakness;Deconditioning;Assistive Device;Other (comment)   Comments L knee pain   Cardiac Risk Stratification High      6 Minute Walk:     6 Minute Walk    Row Name 10/04/16 1232         6 Minute Walk   Phase Initial     Distance 0.35 feet     Walk Time  6 minutes     # of Rest Breaks 0     MPH 0.21     METS 2.1     RPE 11     VO2 Peak 7.35     Symptoms No     Resting HR 79 bpm     Resting BP 170/86     Max Ex. HR 108 bpm     Max Ex. BP 158/80     2 Minute Post BP 158/86  10 minute post 158/80        Oxygen Initial Assessment:   Oxygen Re-Evaluation:   Oxygen Discharge (Final Oxygen Re-Evaluation):   Initial Exercise Prescription:     Initial Exercise Prescription - 10/04/16 1300      Date of Initial Exercise RX and Referring Provider   Date 10/04/16   Referring Provider Bensimhon, Daniel MD     Recumbant Bike   Level 2   Minutes 10   METs 2     NuStep   Level 2   Minutes 10   METs 2     Arm Ergometer   Level 1   Minutes 10   METs 2     Prescription Details   Frequency (times per week) 3   Duration Progress to 30 minutes of continuous aerobic without signs/symptoms of physical distress     Intensity   THRR 40-80% of Max Heartrate 65-131   Ratings of Perceived Exertion 11-13   Perceived Dyspnea 0-4     Progression   Progression Continue progressive overload as per policy without signs/symptoms or physical distress.     Resistance Training   Training Prescription Yes   Weight 2lbs   Reps 10-12      Perform Capillary Blood Glucose checks as needed.  Exercise Prescription Changes:     Exercise Prescription Changes    Row Name  10/30/16 1600 11/28/16 1800 12/12/16 1600 12/25/16 1600 01/10/17 1600     Response to Exercise   Blood Pressure (Admit) 130/70 144/90 142/84 116/72 130/82   Blood Pressure (Exercise) 114/62 138/82 126/80 122/64 126/80   Blood Pressure (Exit) 120/72 140/82 118/80 148/84 120/60   Heart Rate (Admit) 9 bpm 91 bpm 100 bpm 94 bpm 97 bpm   Heart Rate (Exercise) 114 bpm 115 bpm 121 bpm 115 bpm 116 bpm   Heart Rate (Exit) 102 bpm 92 bpm 111 bpm 97 bpm 96 bpm   Rating of Perceived Exertion (Exercise) 11 13 12 11 11    Symptoms none none none none none   Comments  -  - decreased WL due to 2 week  layoff s/p SVT ablation 11/26/16. decreased WL due to 2 week  layoff s/p SVT ablation 11/26/16. decreased WL due to 2 week  layoff s/p SVT ablation 11/26/16.   Duration Progress to 30 minutes of continuous aerobic without signs/symptoms of physical distress Continue with 30 min of aerobic exercise without signs/symptoms of physical distress. Continue with 30 min of aerobic exercise without signs/symptoms of physical distress. Continue with 30 min of aerobic exercise without signs/symptoms of physical distress. Continue with 30 min of aerobic exercise without signs/symptoms of physical distress.   Intensity THRR unchanged THRR unchanged THRR unchanged THRR unchanged THRR unchanged     Progression   Average METs 2 2.1 2 2  2.1     Resistance Training   Training Prescription Yes Yes Yes Yes Yes   Weight 2lbs 3lbs 3lbs 3lbs 3lbs   Reps 10-12 10-15 10-15 10-15 10-15   Time  -  10 Minutes 10 Minutes 10 Minutes 10 Minutes     Recumbant Bike   Level 2 2 1.5 2  -   Minutes 15 15 15 15   -   METs 2 2.1 1.8 1.8  -     NuStep   Level 3 3 3   - 3   Minutes 15 15 15   - 15   METs 2 1.9 1.8  - 1.9     Arm Ergometer   Level  -  -  - 1 1   Minutes  -  -  - 15 15   METs  -  -  - 2.22 2.21     Home Exercise Plan   Plans to continue exercise at  -  -  - Home (comment)  given hip/glute and balance h/o  Home (comment)   given hip/glute and balance h/o    Frequency  -  -  - Add 2 additional days to program exercise sessions. Add 2 additional days to program exercise sessions.   Initial Home Exercises Provided  -  -  - 12/14/16 12/14/16     Exercise Review   Progression Yes Yes Yes Yes Yes   Row Name 01/22/17 1600             Response to Exercise   Blood Pressure (Admit) 128/78       Blood Pressure (Exercise) 120/80       Blood Pressure (Exit) 112/84       Heart Rate (Admit) 97 bpm       Heart Rate (Exercise) 113 bpm       Heart Rate (Exit) 86 bpm       Rating of Perceived Exertion (Exercise) 11       Symptoms none       Duration Continue with 30 min of aerobic exercise without signs/symptoms of physical distress.       Intensity THRR unchanged         Progression   Average METs 2         Resistance Training   Training Prescription Yes       Weight 3lbs       Reps 10-15       Time 10 Minutes         NuStep   Level 3       Minutes 15       METs 1.7         Arm Ergometer   Level 1       Minutes 15       METs 2.21         Home Exercise Plan   Plans to continue exercise at Home (comment)  given hip/glute and balance h/o        Frequency Add 2 additional days to program exercise sessions.       Initial Home Exercises Provided 12/14/16         Exercise Review   Progression Yes          Exercise Comments:     Exercise Comments    Row Name 10/30/16 1632 11/28/16 1809 12/12/16 1605 12/19/16 1533 01/22/17 1617   Exercise Comments Reviewed METs and goals. Pt is tolerating exercise fairly well; will continue to monitor exercise progression. Reviewed METs and goals. Pt is tolerating exercise fairly well; will continue to monitor exercise progression. Pt returned to cardiac rehab after a 2 week lay off due to s/p SVT ablation 11/26/16. Reviewed METs and  goals. Pt is tolerating exercise progression and standing tolerance. Will continue to monitor, Reviewed METs and goals. Pt is tolerating  exercise progression and standing tolerance. Will continue to monitor,      Exercise Goals and Review:     Exercise Goals    Row Name 11/28/16 1807             Exercise Goals   Increase Physical Activity Yes       Intervention Provide advice, education, support and counseling about physical activity/exercise needs.;Develop an individualized exercise prescription for aerobic and resistive training based on initial evaluation findings, risk stratification, comorbidities and participant's personal goals.       Expected Outcomes Achievement of increased cardiorespiratory fitness and enhanced flexibility, muscular endurance and strength shown through measurements of functional capacity and personal statement of participant.       Increase Strength and Stamina Yes       Intervention Provide advice, education, support and counseling about physical activity/exercise needs.;Develop an individualized exercise prescription for aerobic and resistive training based on initial evaluation findings, risk stratification, comorbidities and participant's personal goals.       Expected Outcomes Achievement of increased cardiorespiratory fitness and enhanced flexibility, muscular endurance and strength shown through measurements of functional capacity and personal statement of participant.          Exercise Goals Re-Evaluation :     Exercise Goals Re-Evaluation    Row Name 11/28/16 1807 12/14/16 1517 12/19/16 1512 01/22/17 1617       Exercise Goal Re-Evaluation   Exercise Goals Review Increase Physical Activity;Increase Strenth and Stamina Increase Physical Activity;Increase Strenth and Stamina (P)  Increase Physical Activity;Increase Strenth and Stamina Increase Physical Activity;Increase Strenth and Stamina    Comments Pt has progressed to 30 minutes of continuous exercise in cardiac rehab. Pt has also increased handheld weight from 1 to 3 pounds Reviewed home exercise with pt today.  Pt plans to do  hip strengthening/balance for exercise. In order to maintain or improve functional capacity and improve balance. Reviewed THR, pulse, RPE, sign and symptoms, and when to call 911 or MD.  Also discussed weather considerations and indoor options.  Pt voiced understanding. (P)  Pt stated " feeling stronger and having more energy." Pt also reported " legs feeling stronger" but balance is still a challenge Pt stated "muscle soreness and definition in leg/arms and buttock region" Pt did make mention that it was due to exercise and knows the difference between good pain and bad pain    Expected Outcomes Pt will continue to exercise continuous for 30 minutes and increase in strength/stamina Pt will continue to exercise continuous for 30 minutes and increase in strength/stamina  - Pt will continue to increase in strength, muscle tone and aerobic capacity.        Discharge Exercise Prescription (Final Exercise Prescription Changes):     Exercise Prescription Changes - 01/22/17 1600      Response to Exercise   Blood Pressure (Admit) 128/78   Blood Pressure (Exercise) 120/80   Blood Pressure (Exit) 112/84   Heart Rate (Admit) 97 bpm   Heart Rate (Exercise) 113 bpm   Heart Rate (Exit) 86 bpm   Rating of Perceived Exertion (Exercise) 11   Symptoms none   Duration Continue with 30 min of aerobic exercise without signs/symptoms of physical distress.   Intensity THRR unchanged     Progression   Average METs 2     Resistance Training   Training Prescription Yes  Weight 3lbs   Reps 10-15   Time 10 Minutes     NuStep   Level 3   Minutes 15   METs 1.7     Arm Ergometer   Level 1   Minutes 15   METs 2.21     Home Exercise Plan   Plans to continue exercise at Home (comment)  given hip/glute and balance h/o    Frequency Add 2 additional days to program exercise sessions.   Initial Home Exercises Provided 12/14/16     Exercise Review   Progression Yes      Nutrition:  Target Goals:  Understanding of nutrition guidelines, daily intake of sodium 1500mg , cholesterol 200mg , calories 30% from fat and 7% or less from saturated fats, daily to have 5 or more servings of fruits and vegetables.  Biometrics:     Pre Biometrics - 10/04/16 1256      Pre Biometrics   Waist Circumference 48.5 inches   Hip Circumference 31.75 inches   Waist to Hip Ratio 1.53 %   Triceps Skinfold 37 mm   Grip Strength 26 kg   Flexibility 0 in  balance concerns/balance deficit   Single Leg Stand 0 seconds       Nutrition Therapy Plan and Nutrition Goals:     Nutrition Therapy & Goals - 11/09/16 1415      Nutrition Therapy   Diet Renal, Carb Modified, Therapeutic Lifestyle Changes      Personal Nutrition Goals   Nutrition Goal 1-2 lb wt loss/week to a wt loss goal of 6-24 lb at graduation from rehab     [place], educate and counsel regarding individualized specific dietary modifications aiming towards targeted core components such as weight, hypertension, lipid management, diabetes, heart failure and other comorbidities.   Expected Outcomes Short Term Goal: Understand basic principles of dietary content, such as calories, fat, sodium, cholesterol and nutrients.;Long Term Goal: Adherence to prescribed nutrition plan.      Nutrition Discharge: Nutrition Scores:     Nutrition Assessments - 11/09/16 1405      MEDFICTS Scores   Pre Score 33      Nutrition Goals Re-Evaluation:   Nutrition Goals Re-Evaluation:   Nutrition Goals Discharge (Final Nutrition Goals Re-Evaluation):   Psychosocial: Target Goals: Acknowledge presence or absence of significant depression and/or stress, maximize coping skills, provide positive support system. Participant is able to verbalize types and ability to use techniques and skills needed for reducing stress and depression.  Initial Review & Psychosocial Screening:     Initial Psych Review & Screening -  10/04/16 1835      Initial Review   Source of Stress Concerns Financial      Quality of Life Scores:     Quality of Life - 11/19/16 1545      Quality of Life Scores   Health/Function Pre --  pt quality of life altered by his health status. pt has concerns about his health, especially his energy level. pt routine is focused around his health care needs, pt goes to hemodialysis 3x/wk.  pt has desires to feel better.   GLOBAL Pre --  pt overall qol altered by health status.  pt offered reassurance and emotional support.        PHQ-9: Recent Review Flowsheet Data    Depression screen [facility] 2/9 10/15/2016 12/29/2015 10/24/2015 10/21/2015 01/13/2014   Decreased Interest 1 0 0 0 0   Down, Depressed, Hopeless 1 0 0 0 2  PHQ - 2 Score 2 0 0 0 2   Altered sleeping 1 - - - -   Tired, decreased energy 1 - - - -   Change in appetite 1 - - - -   Feeling bad or failure about yourself  0 - - - -   Trouble concentrating 1 - - - -   Moving slowly or fidgety/restless 0 - - - -   Suicidal thoughts 0 - - - -   PHQ-9 Score 6 - - - -   Difficult doing work/chores Somewhat difficult - - - -     Interpretation of Total Score  Total Score Depression Severity:  1-4 = Minimal depression, 5-9 = Mild depression, 10-14 = Moderate depression, 15-19 = Moderately severe depression, 20-27 = Severe depression   Psychosocial Evaluation and Intervention:     Psychosocial Evaluation - 10/15/16 1541      Psychosocial Evaluation & Interventions   Interventions Stress management education;Relaxation education;Encouraged to exercise with the program and follow exercise prescription   Comments pyschosocial barriers include hemodialysis schedule (3 days weekly)  and residual fatigue.  pt advised may need to adjust CR schedule accordingly.  pt has stress and depression symptoms related to chronic illness.     Continue Psychosocial Services  Yes      Psychosocial Re-Evaluation:     Psychosocial Re-Evaluation     [place] Name 10/29/16 0753 10/31/16 1653 12/18/16 0715         Psychosocial Re-Evaluation   Current issues with  -  - Current Stress Concerns     Comments pt with many comordities is presently out on medical leave.  psychosocial needs will be reevaluated if pt is able to return to cardiac rehab.  pt returned to cardiac rehab and tolerating light activity without difficulty.  pt is pleased to be able to return. pt with multiple comorbidities which does indicate barriers to rehab participation., including end stage renal disease and diabetes.    pt with multiple comorbidities which does indicate barriers to rehab participation, including end stage renal disease and diabetes.  pt is participating in light stretching exercises at home. Fatigue from HD 3 x weekly limits pt ability to participate in home exercise on those days. pt was given HE instructions from EP and verbalized interest into incorporating these into his routine, when physically able.       Expected Outcomes  -  - pt will demonstrate good coping skills with positive outlook.       Interventions  - Encouraged to attend Cardiac Rehabilitation for the exercise;Stress management education;Relaxation education Encouraged to attend Cardiac Rehabilitation for the exercise;Stress management education;Relaxation education     Continue Psychosocial Services  No Yes Follow up required by staff     Comments  -  - Pt has ESRD and is on dialysis three times a week.  pt is also diabetic.         Initial Review   Source of Stress Concerns  -  - Chronic Illness;Financial;Unable to participate in former interests or hobbies;Unable to perform yard/household activities        Psychosocial Discharge (Final Psychosocial Re-Evaluation):     Psychosocial Re-Evaluation - 12/18/16 0715      Psychosocial Re-Evaluation   Current issues with Current Stress Concerns   Comments  pt with multiple comorbidities which does indicate barriers to rehab participation,  including end stage renal disease and diabetes.  pt is participating in light stretching exercises at home. Fatigue from  HD 3 x weekly limits pt ability to participate in home exercise on those days. pt was given HE instructions from EP and verbalized interest into incorporating these into his routine, when physically able.     Expected Outcomes pt will demonstrate good coping skills with positive outlook.     Interventions Encouraged to attend Cardiac Rehabilitation for the exercise;Stress management education;Relaxation education   Continue Psychosocial Services  Follow up required by staff   Comments Pt has ESRD and is on dialysis three times a week.  pt is also diabetic.       Initial Review   Source of Stress Concerns Chronic Illness;Financial;Unable to participate in former interests or hobbies;Unable to perform yard/household activities      Vocational Rehabilitation: Provide vocational rehab assistance to qualifying candidates.   Vocational Rehab Evaluation & Intervention:     Vocational Rehab - 10/04/16 1832      Initial Vocational Rehab Evaluation & Intervention   Assessment shows need for Vocational Rehabilitation No  Pt unable to return to work due to disability.      Education: Education Goals: Education classes will be provided on a weekly basis, covering required topics. Participant will state understanding/return demonstration of topics presented.  Learning Barriers/Preferences:     Learning Barriers/Preferences - 10/04/16 1356      Learning Barriers/Preferences   Learning Barriers None      Education Topics: Count Your Pulse:  -Group instruction provided by verbal instruction, demonstration, patient participation and written materials to support subject.  Instructors address importance of being able to find your pulse and how to count your pulse when at home without a heart monitor.  Patients get hands on experience counting their pulse with staff help and  individually.   Heart Attack, Angina, and Risk Factor Modification:  -Group instruction provided by verbal instruction, video, and written materials to support subject.  Instructors address signs and symptoms of angina and heart attacks.    Also discuss risk factors for heart disease and how to make changes to improve heart health risk factors.   Functional Fitness:  -Group instruction provided by verbal instruction, demonstration, patient participation, and written materials to support subject.  Instructors address safety measures for doing things around the house.  Discuss how to get up and down off the floor, how to pick things up properly, how to safely get out of a chair without assistance, and balance training.   Meditation and Mindfulness:  -Group instruction provided by verbal instruction, patient participation, and written materials to support subject.  Instructor addresses importance of mindfulness and meditation practice to help reduce stress and improve awareness.  Instructor also leads participants through a meditation exercise.    Stretching for Flexibility and Mobility:  -Group instruction provided by verbal instruction, patient participation, and written materials to support subject.  Instructors lead participants through series of stretches that are designed to increase flexibility thus improving mobility.  These stretches are additional exercise for major muscle groups that are typically performed during regular warm up and cool down.   Hands Only CPR Anytime:  -Group instruction provided by verbal instruction, video, patient participation and written materials to support subject.  Instructors co-teach with AHA video for hands only CPR.  Participants get hands on experience with mannequins.   Nutrition I class: Heart Healthy Eating:  -Group instruction provided by PowerPoint slides, verbal discussion, and written materials to support subject matter. The instructor gives an  explanation and review of the Therapeutic Lifestyle Changes diet recommendations,  which includes a discussion on lipid goals, dietary fat, sodium, fiber, plant stanol/sterol esters, sugar, and the components of a well-balanced, healthy diet.   CARDIAC REHAB PHASE II EXERCISE from 11/09/2016 in [place]  Date  11/09/16  Educator  RD  Instruction Review Code  Not applicable [class handouts given]      Nutrition II class: Lifestyle Skills:  -Group instruction provided by PowerPoint slides, verbal discussion, and written materials to support subject matter. The instructor gives an explanation and review of label reading, grocery shopping for heart health, heart healthy recipe modifications, and ways to make healthier choices when eating out.   CARDIAC REHAB PHASE II EXERCISE from 11/09/2016 in [place]  Date  11/09/16  Educator  RD  Instruction Review Code  Not applicable [class handouts given]      Diabetes Question & Answer:  -Group instruction provided by PowerPoint slides, verbal discussion, and written materials to support subject matter. The instructor gives an explanation and review of diabetes co-morbidities, pre- and post-prandial blood glucose goals, pre-exercise blood glucose goals, signs, symptoms, and treatment of hypoglycemia and hyperglycemia, and foot care basics.   Diabetes Blitz:  -Group instruction provided by PowerPoint slides, verbal discussion, and written materials to support subject matter. The instructor gives an explanation and review of the physiology behind type 1 and type 2 diabetes, diabetes medications and rational behind using different medications, pre- and post-prandial blood glucose recommendations and Hemoglobin A1c goals, diabetes diet, and exercise including blood glucose guidelines for exercising safely.    CARDIAC REHAB PHASE II EXERCISE from 11/09/2016 in [place]  Date  11/09/16  Educator  RD  Instruction Review Code  Not applicable [class handouts given]      Portion Distortion:  -Group instruction provided by PowerPoint slides, verbal discussion, written materials, and food models to support subject matter. The instructor gives an explanation of serving size versus portion size, changes in portions sizes over the last 20 years, and what consists of a serving from each food group.   Stress Management:  -Group instruction provided by verbal instruction, video, and written materials to support subject matter.  Instructors review role of stress in heart disease and how to cope with stress positively.     Exercising on Your Own:  -Group instruction provided by verbal instruction, power point, and written materials to support subject.  Instructors discuss benefits of exercise, components of exercise, frequency and intensity of exercise, and end points for exercise.  Also discuss use of nitroglycerin and activating EMS.  Review options of places to exercise outside of rehab.  Review guidelines for sex with heart disease.   Cardiac Drugs I:  -Group instruction provided by verbal instruction and written materials to support subject.  Instructor reviews cardiac drug classes: antiplatelets, anticoagulants, beta blockers, and statins.  Instructor discusses reasons, side effects, and lifestyle considerations for each drug class.   Cardiac Drugs II:  -Group instruction provided by verbal instruction and written materials to support subject.  Instructor reviews cardiac drug classes: angiotensin converting enzyme inhibitors (ACE-I), angiotensin II receptor blockers (ARBs), nitrates, and calcium channel blockers.  Instructor discusses reasons, side effects, and lifestyle considerations for each drug class.   Anatomy and Physiology of the Circulatory System:  -Group instruction provided by verbal instruction, video, and written materials to support subject.   Reviews functional anatomy of heart, how it relates to various diagnoses, and what role the heart plays  in the overall system.   Knowledge Questionnaire Score:     Knowledge Questionnaire Score - 11/09/16 1405      Knowledge Questionnaire Score   Pre Score 18/28     DM 12/15      Core Components/Risk Factors/Patient Goals at Admission:     Personal Goals and Risk Factors at Admission - 10/04/16 0859      Core Components/Risk Factors/Patient Goals on Admission    Weight Management Yes;Obesity;Weight Loss   Intervention Weight Management: Develop a combined nutrition and exercise program designed to reach desired caloric intake, while maintaining appropriate intake of nutrient and fiber, sodium and fats, and appropriate energy expenditure required for the weight goal.;Weight Management: Provide education and appropriate resources to help participant work on and attain dietary goals.;Weight Management/Obesity: Establish reasonable short term and long term weight goals.;Obesity: Provide education and appropriate resources to help participant work on and attain dietary goals.   Expected Outcomes Short Term: Continue to assess and modify interventions until short term weight is achieved;Weight Maintenance: Understanding of the daily nutrition guidelines, which includes 25-35% calories from fat, 7% or less cal from saturated fats, less than 200mg  cholesterol, less than 1.5gm of sodium, & 5 or more servings of fruits and vegetables daily;Understanding recommendations for meals to include 15-35% energy as protein, 25-35% energy from fat, 35-60% energy from carbohydrates, less than 200mg  of dietary cholesterol, 20-35 gm of total fiber daily;Weight Loss: Understanding of general recommendations for a balanced deficit meal plan, which promotes 1-2 lb weight loss per week and includes a negative energy balance of [phone] kcal/d;Understanding of distribution of calorie intake throughout the day with the  consumption of 4-5 meals/snacks;Weight Gain: Understanding of general recommendations for a high calorie, high protein meal plan that promotes weight gain by distributing calorie intake throughout the day with the consumption for 4-5 meals, snacks, and/or supplements   Sedentary Yes   Intervention Provide advice, education, support and counseling about physical activity/exercise needs.;Develop an individualized exercise prescription for aerobic and resistive training based on initial evaluation findings, risk stratification, comorbidities and participant's personal goals.   Expected Outcomes Achievement of increased cardiorespiratory fitness and enhanced flexibility, muscular endurance and strength shown through measurements of functional capacity and personal statement of participant.   Increase Strength and Stamina Yes   Intervention Provide advice, education, support and counseling about physical activity/exercise needs.;Develop an individualized exercise prescription for aerobic and resistive training based on initial evaluation findings, risk stratification, comorbidities and participant's personal goals.   Expected Outcomes Achievement of increased cardiorespiratory fitness and enhanced flexibility, muscular endurance and strength shown through measurements of functional capacity and personal statement of participant.   Improve shortness of breath with ADL's Yes   Intervention Provide education, individualized exercise plan and daily activity instruction to help decrease symptoms of SOB with activities of daily living.   Expected Outcomes Short Term: Achieves a reduction of symptoms when performing activities of daily living.   Diabetes Yes   Intervention Provide education about signs/symptoms and action to take for hypo/hyperglycemia.;Provide education about proper nutrition, including hydration, and aerobic/resistive exercise prescription along with prescribed medications to achieve blood glucose  in normal ranges: Fasting glucose 65-99 mg/dL   Expected Outcomes Short Term: Participant verbalizes understanding of the signs/symptoms and immediate care of hyper/hypoglycemia, proper foot care and importance of medication, aerobic/resistive exercise and nutrition plan for blood glucose control.;Long Term: Attainment of HbA1C < 7%.   Lipids Yes   Intervention Provide education and support for participant on nutrition &  aerobic/resistive exercise along with prescribed medications to achieve LDL 70mg , HDL >40mg .   Expected Outcomes Short Term: Participant states understanding of desired cholesterol values and is compliant with medications prescribed. Participant is following exercise prescription and nutrition guidelines.;Long Term: Cholesterol controlled with medications as prescribed, with individualized exercise RX and with personalized nutrition plan. Value goals: LDL < 70mg , HDL > 40 mg.   Stress Yes   Intervention Offer individual and/or small group education and counseling on adjustment to heart disease, stress management and health-related lifestyle change. Teach and support self-help strategies.;Refer participants experiencing significant psychosocial distress to appropriate mental health specialists for further evaluation and treatment. When possible, include family members and significant others in education/counseling sessions.   Expected Outcomes Short Term: Participant demonstrates changes in health-related behavior, relaxation and other stress management skills, ability to obtain effective social support, and compliance with psychotropic medications if prescribed.;Long Term: Emotional wellbeing is indicated by absence of clinically significant psychosocial distress or social isolation.   Personal Goal Other Yes   Personal Goal short: improve strength, gait and stability    long: improve standing tolerance and UE strength   Intervention Provide exercise programming to assist with improving  cardiovascular fitness and exercise tolerance. Provide an exercise routine to assist with improving strength, balance and stability.   Expected Outcomes Pt will be able to improve standing tolerance and global strength to improve overall quality of life.      Core Components/Risk Factors/Patient Goals Review:      Goals and Risk Factor Review    Row Name 10/31/16 1459 11/28/16 1816 12/26/16 1218         Core Components/Risk Factors/Patient Goals Review   Personal Goals Review Increase Strength and Stamina Other Weight Management/Obesity;Diabetes;Lipids;Improve shortness of breath with ADL's;Stress;Other     Review Pt stated still needs improvement in UE strength, however does feel better after each cardiac rehab session. Pt stated feeling muscle soreness in a good way; feels as though getting stronger Pt stated " balance and standing tolerance has improved but still rely on walker for added support."  Pt also reported " feeling confident in walking around the house." pt balance and standing tolerance improving.  pt continues to use walker for support and safety. pt with frequent CR absences due to medical complications, wrist pain and most recently tachycardia.      Expected Outcomes Pt will continue to improve in cardiorespiratory fitness, strength and endurance Pt wll continue to improve in balance, gait and functional mobility Pt wll continue to improve in balance, gait and functional mobility        Core Components/Risk Factors/Patient Goals at Discharge (Final Review):      Goals and Risk Factor Review - 12/26/16 1218      Core Components/Risk Factors/Patient Goals Review   Personal Goals Review Weight Management/Obesity;Diabetes;Lipids;Improve shortness of breath with ADL's;Stress;Other   Review pt balance and standing tolerance improving.  pt continues to use walker for support and safety. pt with frequent CR absences due to medical complications, wrist pain and most recently  tachycardia.    Expected Outcomes Pt wll continue to improve in balance, gait and functional mobility      ITP Comments:     ITP Comments    Row Name 10/04/16 0857           ITP Comments Dr. Fransico Him, Medical Director          Comments: Pt is making expected progress toward personal goals after completing  20 sessions.  Recommend continued exercise and life style modification education including  stress management and relaxation techniques to decrease cardiac risk profile.

## 2017-01-23 NOTE — Patient Instructions (Addendum)
Please continue the same medications If ever you have fever while taking methimazole, stop it and call us, even if the reason is obvious, because of the risk of a rare side-effect. Please come back for a follow-up appointment in 4 months.

## 2017-01-24 DIAGNOSIS — Z23 Encounter for immunization: Secondary | ICD-10-CM | POA: Diagnosis not present

## 2017-01-24 DIAGNOSIS — N186 End stage renal disease: Secondary | ICD-10-CM | POA: Diagnosis not present

## 2017-01-24 DIAGNOSIS — E119 Type 2 diabetes mellitus without complications: Secondary | ICD-10-CM | POA: Diagnosis not present

## 2017-01-24 DIAGNOSIS — N2581 Secondary hyperparathyroidism of renal origin: Secondary | ICD-10-CM | POA: Diagnosis not present

## 2017-01-24 DIAGNOSIS — D631 Anemia in chronic kidney disease: Secondary | ICD-10-CM | POA: Diagnosis not present

## 2017-01-25 ENCOUNTER — Other Ambulatory Visit: Payer: Self-pay

## 2017-01-25 ENCOUNTER — Encounter (HOSPITAL_COMMUNITY)
Admission: RE | Admit: 2017-01-25 | Discharge: 2017-01-25 | Disposition: A | Discharge status: Home / Self Care | Payer: Medicare Other | Source: Ambulatory Visit | Attending: Internal Medicine | Admitting: Internal Medicine

## 2017-01-25 DIAGNOSIS — Z48812 Encounter for surgical aftercare following surgery on the circulatory system: Secondary | ICD-10-CM | POA: Diagnosis not present

## 2017-01-25 DIAGNOSIS — Z955 Presence of coronary angioplasty implant and graft: Secondary | ICD-10-CM | POA: Diagnosis not present

## 2017-01-25 DIAGNOSIS — I214 Non-ST elevation (NSTEMI) myocardial infarction: Secondary | ICD-10-CM

## 2017-01-25 NOTE — Patient Outreach (Signed)
[facility]) Care Management  01/25/2017  PAU BANH 1951-01-14 [id number]  EMMI: heart failure Referral date: 01/25/17 Referral source: EMMI heart failure red alert Referral reason: New worsening symptoms: YES, new or worsening shortness of breath: YES Day # 24  Telephone call to patient regarding EMMI stroke red alert. Authorization given by patient to speak with his wife regarding all his personal health information. Wife, Angelina Neece verified HIPAA for patient.  Wife states patient is not having any new symptoms or worsening shortness of breath. Wife states patient is not feeling the best but that has been normal for him for a while. Wife states patient is having his normal shortness of breath with activity. Wife states patient is very concerned about why he feels so weak. Wife states one doctor told him it was his heart and the another doctor told him it was dialysis. Wife states patient agreed to having a [facility] care management nurse come out to see him to help him understand whats going on with him.  Wife states patient has not weighed today because he is still in bed. Wife states patient has cardiac rehab today. Wife states patient has dialysis on Tuesday, Thursday, and Saturday and has cardiac rehab on Monday, Wednesday, and Friday.    PLAN; RNCM will refer patient to community case manager for follow up as requested.   Lamar Blinks Glaude RN,BSN,CCM [facility] Telephonic  [phone]     PLAN:

## 2017-01-25 NOTE — Patient Outreach (Signed)
Patient triggered Red on EMMI Heart Failure Dashboard.  Notification sent to:  Quinn Plowman, RN

## 2017-01-26 DIAGNOSIS — N186 End stage renal disease: Secondary | ICD-10-CM | POA: Diagnosis not present

## 2017-01-26 DIAGNOSIS — D631 Anemia in chronic kidney disease: Secondary | ICD-10-CM | POA: Diagnosis not present

## 2017-01-26 DIAGNOSIS — N2581 Secondary hyperparathyroidism of renal origin: Secondary | ICD-10-CM | POA: Diagnosis not present

## 2017-01-26 DIAGNOSIS — E119 Type 2 diabetes mellitus without complications: Secondary | ICD-10-CM | POA: Diagnosis not present

## 2017-01-26 DIAGNOSIS — Z23 Encounter for immunization: Secondary | ICD-10-CM | POA: Diagnosis not present

## 2017-01-28 ENCOUNTER — Encounter (HOSPITAL_COMMUNITY)
Admission: RE | Admit: 2017-01-28 | Discharge: 2017-01-28 | Disposition: A | Discharge status: Home / Self Care | Payer: Medicare Other | Source: Ambulatory Visit | Attending: Internal Medicine | Admitting: Internal Medicine

## 2017-01-28 DIAGNOSIS — I214 Non-ST elevation (NSTEMI) myocardial infarction: Secondary | ICD-10-CM | POA: Diagnosis not present

## 2017-01-28 DIAGNOSIS — N186 End stage renal disease: Secondary | ICD-10-CM | POA: Diagnosis not present

## 2017-01-28 DIAGNOSIS — Z992 Dependence on renal dialysis: Secondary | ICD-10-CM | POA: Diagnosis not present

## 2017-01-28 DIAGNOSIS — E1129 Type 2 diabetes mellitus with other diabetic kidney complication: Secondary | ICD-10-CM | POA: Diagnosis not present

## 2017-01-28 DIAGNOSIS — Z48812 Encounter for surgical aftercare following surgery on the circulatory system: Secondary | ICD-10-CM | POA: Diagnosis not present

## 2017-01-28 DIAGNOSIS — Z955 Presence of coronary angioplasty implant and graft: Secondary | ICD-10-CM | POA: Diagnosis not present

## 2017-01-29 ENCOUNTER — Other Ambulatory Visit: Payer: Self-pay

## 2017-01-29 DIAGNOSIS — Z23 Encounter for immunization: Secondary | ICD-10-CM | POA: Diagnosis not present

## 2017-01-29 DIAGNOSIS — D631 Anemia in chronic kidney disease: Secondary | ICD-10-CM | POA: Diagnosis not present

## 2017-01-29 DIAGNOSIS — D509 Iron deficiency anemia, unspecified: Secondary | ICD-10-CM | POA: Diagnosis not present

## 2017-01-29 DIAGNOSIS — N186 End stage renal disease: Secondary | ICD-10-CM | POA: Diagnosis not present

## 2017-01-29 DIAGNOSIS — E119 Type 2 diabetes mellitus without complications: Secondary | ICD-10-CM | POA: Diagnosis not present

## 2017-01-29 DIAGNOSIS — N2581 Secondary hyperparathyroidism of renal origin: Secondary | ICD-10-CM | POA: Diagnosis not present

## 2017-01-29 NOTE — Patient Outreach (Signed)
[facility]) Care Management  01/29/17  [name] 06-Jul-1951  [id number]  Received RED EMMI alert today for date 01/28/2017 due to patient not weighing himself.  Successful outreach completed with patient and his wife, [name]. They were currently in the car driving.  RNCM advised of purpose of call, calling to follow up regarding patient not weighing per EMMI call yesterday. She stated that at the time the call came in, he had not yet weighed because he had not been out of bed yet.  Patient reported that he weighed 235 yesterday when he got up and weighed.   Patient's weight on 4/26 was 235 per EMMI record with no successful outreaches between 4/26 and 4/30.   Unable to complete assessment on this call due to patient and his wife being busy. Call was scheduled for tomorrow and they were both agreeable to outreach at that time. They do not have any availability this week for a home visit.  Plan: Will contact patient tomorrow to initiate assessment. Home visit scheduled for next week.  [name] R. Virgil Slinger, RN, BSN, [place] Management Coordinator [phone]

## 2017-01-30 ENCOUNTER — Other Ambulatory Visit: Payer: Self-pay

## 2017-01-30 ENCOUNTER — Encounter (HOSPITAL_COMMUNITY)
Admission: RE | Admit: 2017-01-30 | Discharge: 2017-01-30 | Disposition: A | Discharge status: Home / Self Care | Payer: Medicare Other | Source: Ambulatory Visit | Attending: Internal Medicine | Admitting: Internal Medicine

## 2017-01-30 DIAGNOSIS — I214 Non-ST elevation (NSTEMI) myocardial infarction: Secondary | ICD-10-CM | POA: Diagnosis not present

## 2017-01-30 DIAGNOSIS — Z955 Presence of coronary angioplasty implant and graft: Secondary | ICD-10-CM | POA: Diagnosis not present

## 2017-01-30 DIAGNOSIS — Z48812 Encounter for surgical aftercare following surgery on the circulatory system: Secondary | ICD-10-CM | POA: Diagnosis not present

## 2017-01-30 NOTE — Patient Outreach (Signed)
[facility]) Care Management   01/30/17  MILIND RAETHER 01-02-1951 [id number]  Received RED EMMI alert for 01/29/2017 for patient answering yes to new or worsening symptoms.  RNCM attempted to call patient today without success. Left HIPAA compliant voicemail with RNCM contact information and invited callback.   RNCM did speak with patient and his wife on afternoon of 01/29/2017 and they did not not indicate any new or worsening problems on the call. Will continue to try to reach patient to discuss.  Plan: Home visit is scheduled for next week due to patient's busy schedule of appointments.  [place] R. Tobyn Osgood, RN, BSN, [place] Management Coordinator [phone]

## 2017-01-31 DIAGNOSIS — E119 Type 2 diabetes mellitus without complications: Secondary | ICD-10-CM | POA: Diagnosis not present

## 2017-01-31 DIAGNOSIS — D631 Anemia in chronic kidney disease: Secondary | ICD-10-CM | POA: Diagnosis not present

## 2017-01-31 DIAGNOSIS — N2581 Secondary hyperparathyroidism of renal origin: Secondary | ICD-10-CM | POA: Diagnosis not present

## 2017-01-31 DIAGNOSIS — Z23 Encounter for immunization: Secondary | ICD-10-CM | POA: Diagnosis not present

## 2017-01-31 DIAGNOSIS — D509 Iron deficiency anemia, unspecified: Secondary | ICD-10-CM | POA: Diagnosis not present

## 2017-01-31 DIAGNOSIS — N186 End stage renal disease: Secondary | ICD-10-CM | POA: Diagnosis not present

## 2017-02-01 ENCOUNTER — Encounter (HOSPITAL_COMMUNITY)
Admission: RE | Admit: 2017-02-01 | Discharge: 2017-02-01 | Disposition: A | Discharge status: Home / Self Care | Payer: Medicare Other | Source: Ambulatory Visit | Attending: Internal Medicine | Admitting: Internal Medicine

## 2017-02-01 ENCOUNTER — Encounter (HOSPITAL_COMMUNITY): Payer: Self-pay

## 2017-02-01 VITALS — Ht 69.0 in

## 2017-02-01 DIAGNOSIS — I214 Non-ST elevation (NSTEMI) myocardial infarction: Secondary | ICD-10-CM | POA: Diagnosis not present

## 2017-02-01 DIAGNOSIS — Z48812 Encounter for surgical aftercare following surgery on the circulatory system: Secondary | ICD-10-CM | POA: Diagnosis not present

## 2017-02-01 DIAGNOSIS — Z955 Presence of coronary angioplasty implant and graft: Secondary | ICD-10-CM | POA: Diagnosis not present

## 2017-02-02 DIAGNOSIS — N186 End stage renal disease: Secondary | ICD-10-CM | POA: Diagnosis not present

## 2017-02-02 DIAGNOSIS — N2581 Secondary hyperparathyroidism of renal origin: Secondary | ICD-10-CM | POA: Diagnosis not present

## 2017-02-02 DIAGNOSIS — E119 Type 2 diabetes mellitus without complications: Secondary | ICD-10-CM | POA: Diagnosis not present

## 2017-02-02 DIAGNOSIS — D509 Iron deficiency anemia, unspecified: Secondary | ICD-10-CM | POA: Diagnosis not present

## 2017-02-02 DIAGNOSIS — D631 Anemia in chronic kidney disease: Secondary | ICD-10-CM | POA: Diagnosis not present

## 2017-02-02 DIAGNOSIS — Z23 Encounter for immunization: Secondary | ICD-10-CM | POA: Diagnosis not present

## 2017-02-04 ENCOUNTER — Encounter (HOSPITAL_COMMUNITY)
Admission: RE | Admit: 2017-02-04 | Discharge: 2017-02-04 | Disposition: A | Discharge status: Home / Self Care | Payer: Medicare Other | Source: Ambulatory Visit | Attending: Internal Medicine | Admitting: Internal Medicine

## 2017-02-04 ENCOUNTER — Other Ambulatory Visit: Payer: Self-pay

## 2017-02-04 DIAGNOSIS — M19031 Primary osteoarthritis, right wrist: Secondary | ICD-10-CM | POA: Diagnosis not present

## 2017-02-04 DIAGNOSIS — I214 Non-ST elevation (NSTEMI) myocardial infarction: Secondary | ICD-10-CM | POA: Diagnosis not present

## 2017-02-04 DIAGNOSIS — Z48812 Encounter for surgical aftercare following surgery on the circulatory system: Secondary | ICD-10-CM | POA: Diagnosis not present

## 2017-02-04 DIAGNOSIS — Z955 Presence of coronary angioplasty implant and graft: Secondary | ICD-10-CM

## 2017-02-04 DIAGNOSIS — M11231 Other chondrocalcinosis, right wrist: Secondary | ICD-10-CM | POA: Diagnosis not present

## 2017-02-04 DIAGNOSIS — C61 Malignant neoplasm of prostate: Secondary | ICD-10-CM | POA: Diagnosis not present

## 2017-02-04 DIAGNOSIS — G5601 Carpal tunnel syndrome, right upper limb: Secondary | ICD-10-CM | POA: Diagnosis not present

## 2017-02-04 LAB — GLUCOSE, CAPILLARY: Glucose-Capillary: 145 mg/dL — ABNORMAL HIGH (ref 65–99)

## 2017-02-04 NOTE — Patient Outreach (Signed)
[facility]) Care Management   02/04/17  RAMIREZ FULLBRIGHT 18-Feb-1951 [id number]  Received RED EMMI alerts for patient as follows:  02/01/2017 Caregiver answered "YES" to 1) New/worsening problems and 2) New/worsening shortness of breath Successful outreach completed with patient's wife, Yordan Martindale on cell number [phone]. Advised calling to follow up on EMMI alerts received and she stated that they are actually in the doctor's office right now waiting to be seen. She stated she would call RNCM back when available. RNCM ensured she has RNCM contact information and will await callback.  [place] R. Bobbette Eakes, RN, BSN, [place] Management Coordinator [phone]

## 2017-02-04 NOTE — Progress Notes (Signed)
Discharge Summary  Patient Details  Name: [name] MRN: [id number] Date of Birth: 1950/12/30 Referring Provider:     [place] from 10/04/2016 in [place]  Referring Provider  Glori Bickers MD       Number of Visits: 27  Reason for Discharge:  Patient reached a stable level of exercise.  Smoking History:  History  Smoking Status  . Former Smoker  . Packs/day: 1.00  . Years: 25.00  . Types: Cigarettes  . Quit date: 10/01/2005  Smokeless Tobacco  . Former Systems developer  . Quit date: 10/01/2005    Comment: Quit smoking 2007 Smoked x 25 years 1/2 ppd.    Diagnosis:  08/04/16 NSTEMI (non-ST elevated myocardial infarction) ([place])  08/04/16 Status post coronary artery stent placement  ADL UCSD:   Initial Exercise Prescription:     Initial Exercise Prescription - 10/04/16 1300      Date of Initial Exercise RX and Referring Provider   Date 10/04/16   Referring Provider Glori Bickers MD     Recumbant Bike   Level 2   Minutes 10   METs 2     NuStep   Level 2   Minutes 10   METs 2     Arm Ergometer   Level 1   Minutes 10   METs 2     Prescription Details   Frequency (times per week) 3   Duration Progress to 30 minutes of continuous aerobic without signs/symptoms of physical distress     Intensity   THRR 40-80% of Max Heartrate 65-131   Ratings of Perceived Exertion 11-13   Perceived Dyspnea 0-4     Progression   Progression Continue progressive overload as per policy without signs/symptoms or physical distress.     Resistance Training   Training Prescription Yes   Weight 2lbs   Reps 10-12      Discharge Exercise Prescription (Final Exercise Prescription Changes):     Exercise Prescription Changes - 02/04/17 1600      Response to Exercise   Blood Pressure (Admit) 150/84   Blood Pressure (Exercise) 124/80   Blood Pressure (Exit) 136/86   Heart Rate (Admit) 98 bpm   Heart Rate (Exercise)  106 bpm   Heart Rate (Exit) 106 bpm   Rating of Perceived Exertion (Exercise) 11   Symptoms none   Duration Continue with 30 min of aerobic exercise without signs/symptoms of physical distress.   Intensity THRR unchanged     Progression   Progression Continue to progress workloads to maintain intensity without signs/symptoms of physical distress.   Average METs 2.1     Resistance Training   Training Prescription Yes   Weight 3lbs   Reps 10-15   Time 10 Minutes     NuStep   Level 3   Minutes 15   METs 2.1     Arm Ergometer   Level 1   Minutes 15   METs 2.23     Home Exercise Plan   Plans to continue exercise at Home (comment)  pt given hip/glute and balance progression handout   Frequency Add 2 additional days to program exercise sessions.   Initial Home Exercises Provided 12/14/16      Functional Capacity:     6 Minute Walk    Row Name 10/04/16 1232 02/01/17 1645       6 Minute Walk   Phase Initial Discharge    Distance 0.35 feet 0.42 feet  measurements  in Km    Distance % Change  - 19.95 %    Walk Time 6 minutes 6 minutes    # of Rest Breaks 0 0    MPH 0.21 2.61  .21 entered in error, 2.1 correction    METS 2.1 2.8    RPE 11 11    VO2 Peak 7.35 9.81  7.35 entered in error; 9.32 correction    Symptoms No No    Resting HR 79 bpm 99 bpm    Resting BP 170/86 144/84    Max Ex. HR 108 bpm 105 bpm    Max Ex. BP 158/80 138/78    2 Minute Post BP 158/86  10 minute post 158/80 124/76       Psychological, QOL, Others - Outcomes: PHQ 2/9: Depression screen [facility] 2/9 02/07/2017 02/01/2017 10/15/2016 12/29/2015 10/24/2015  Decreased Interest 3 0 1 0 0  Down, Depressed, Hopeless 3 1 1  0 0  PHQ - 2 Score 6 1 2  0 0  Altered sleeping 1 - 1 - -  Tired, decreased energy 3 - 1 - -  Change in appetite 1 - 1 - -  Feeling bad or failure about yourself  0 - 0 - -  Trouble concentrating 0 - 1 - -  Moving slowly or fidgety/restless 0 - 0 - -  Suicidal thoughts 0 - 0 - -   PHQ-9 Score 11 - 6 - -  Difficult doing work/chores Extremely dIfficult Somewhat difficult Somewhat difficult - -  Some recent data might be hidden    Quality of Life:     Quality of Life - 02/19/17 1523      Quality of Life Scores   GLOBAL Post --  pt did not complete written assessment.  pt with multiple chronic illness, does exhibit increased social interaction with classmates and increased energy, strength and stamina with CR particiapation.       Personal Goals: Goals established at orientation with interventions provided to work toward goal.     Personal Goals and Risk Factors at Admission - 10/04/16 0859      Core Components/Risk Factors/Patient Goals on Admission    Weight Management Yes;Obesity;Weight Loss   Intervention Weight Management: Develop a combined nutrition and exercise program designed to reach desired caloric intake, while maintaining appropriate intake of nutrient and fiber, sodium and fats, and appropriate energy expenditure required for the weight goal.;Weight Management: Provide education and appropriate resources to help participant work on and attain dietary goals.;Weight Management/Obesity: Establish reasonable short term and long term weight goals.;Obesity: Provide education and appropriate resources to help participant work on and attain dietary goals.   Expected Outcomes Short Term: Continue to assess and modify interventions until short term weight is achieved;Weight Maintenance: Understanding of the daily nutrition guidelines, which includes 25-35% calories from fat, 7% or less cal from saturated fats, less than 240m cholesterol, less than 1.5gm of sodium, & 5 or more servings of fruits and vegetables daily;Understanding recommendations for meals to include 15-35% energy as protein, 25-35% energy from fat, 35-60% energy from carbohydrates, less than 2010mof dietary cholesterol, 20-35 gm of total fiber daily;Weight Loss: Understanding of general  recommendations for a balanced deficit meal plan, which promotes 1-2 lb weight loss per week and includes a negative energy balance of [phone] kcal/d;Understanding of distribution of calorie intake throughout the day with the consumption of 4-5 meals/snacks;Weight Gain: Understanding of general recommendations for a high calorie, high protein meal plan that promotes weight gain by distributing  calorie intake throughout the day with the consumption for 4-5 meals, snacks, and/or supplements   Sedentary Yes   Intervention Provide advice, education, support and counseling about physical activity/exercise needs.;Develop an individualized exercise prescription for aerobic and resistive training based on initial evaluation findings, risk stratification, comorbidities and participant's personal goals.   Expected Outcomes Achievement of increased cardiorespiratory fitness and enhanced flexibility, muscular endurance and strength shown through measurements of functional capacity and personal statement of participant.   Increase Strength and Stamina Yes   Intervention Provide advice, education, support and counseling about physical activity/exercise needs.;Develop an individualized exercise prescription for aerobic and resistive training based on initial evaluation findings, risk stratification, comorbidities and participant's personal goals.   Expected Outcomes Achievement of increased cardiorespiratory fitness and enhanced flexibility, muscular endurance and strength shown through measurements of functional capacity and personal statement of participant.   Improve shortness of breath with ADL's Yes   Intervention Provide education, individualized exercise plan and daily activity instruction to help decrease symptoms of SOB with activities of daily living.   Expected Outcomes Short Term: Achieves a reduction of symptoms when performing activities of daily living.   Diabetes Yes   Intervention Provide education  about signs/symptoms and action to take for hypo/hyperglycemia.;Provide education about proper nutrition, including hydration, and aerobic/resistive exercise prescription along with prescribed medications to achieve blood glucose in normal ranges: Fasting glucose 65-99 mg/dL   Expected Outcomes Short Term: Participant verbalizes understanding of the signs/symptoms and immediate care of hyper/hypoglycemia, proper foot care and importance of medication, aerobic/resistive exercise and nutrition plan for blood glucose control.;Long Term: Attainment of HbA1C < 7%.   Lipids Yes   Intervention Provide education and support for participant on nutrition & aerobic/resistive exercise along with prescribed medications to achieve LDL <64m, HDL >483m   Expected Outcomes Short Term: Participant states understanding of desired cholesterol values and is compliant with medications prescribed. Participant is following exercise prescription and nutrition guidelines.;Long Term: Cholesterol controlled with medications as prescribed, with individualized exercise RX and with personalized nutrition plan. Value goals: LDL < 7065mHDL > 40 mg.   Stress Yes   Intervention Offer individual and/or small group education and counseling on adjustment to heart disease, stress management and health-related lifestyle change. Teach and support self-help strategies.;Refer participants experiencing significant psychosocial distress to appropriate mental health specialists for further evaluation and treatment. When possible, include family members and significant others in education/counseling sessions.   Expected Outcomes Short Term: Participant demonstrates changes in health-related behavior, relaxation and other stress management skills, ability to obtain effective social support, and compliance with psychotropic medications if prescribed.;Long Term: Emotional wellbeing is indicated by absence of clinically significant psychosocial distress or  social isolation.   Personal Goal Other Yes   Personal Goal short: improve strength, gait and stability    long: improve standing tolerance and UE strength   Intervention Provide exercise programming to assist with improving cardiovascular fitness and exercise tolerance. Provide an exercise routine to assist with improving strength, balance and stability.   Expected Outcomes Pt will be able to improve standing tolerance and global strength to improve overall quality of life.       Personal Goals Discharge:     Goals and Risk Factor Review    Row Name 10/31/16 1459 11/28/16 1816 12/26/16 1218 02/01/17 1508       Core Components/Risk Factors/Patient Goals Review   Personal Goals Review Increase Strength and Stamina Other Weight Management/Obesity;Diabetes;Lipids;Improve shortness of breath with ADL's;Stress;Other Weight Management/Obesity;Diabetes;Lipids;Improve shortness of breath  with ADL's;Stress;Other    Review Pt stated still needs improvement in UE strength, however does feel better after each cardiac rehab session. Pt stated feeling muscle soreness in a good way; feels as though getting stronger Pt stated " balance and standing tolerance has improved but still rely on walker for added support."  Pt also reported " feeling confident in walking around the house." pt balance and standing tolerance improving.  pt continues to use walker for support and safety. pt with frequent CR absences due to medical complications, wrist pain and most recently tachycardia.  pt congratulated on success in CR including being able to complete program and plans to attend cardiac maintenance program. pt encouraged to continue exercise to maintain strength/stamina and exercise tolerance he has gained.      Expected Outcomes Pt will continue to improve in cardiorespiratory fitness, strength and endurance Pt wll continue to improve in balance, gait and functional mobility Pt wll continue to improve in balance, gait and  functional mobility Pt wll continue to improve in balance, gait and functional mobility       Nutrition & Weight - Outcomes:     Pre Biometrics - 10/04/16 1256      Pre Biometrics   Waist Circumference 48.5 inches   Hip Circumference 31.75 inches   Waist to Hip Ratio 1.53 %   Triceps Skinfold 37 mm   Grip Strength 26 kg   Flexibility 0 in  balance concerns/balance deficit   Single Leg Stand 0 seconds         Post Biometrics - 02/01/17 1643       Post  Biometrics   Height 5' 9"  (1.753 m)   Waist Circumference 47.5 inches   Hip Circumference 40 inches   Waist to Hip Ratio 1.19 %   Triceps Skinfold 38 mm   % Body Fat 37.5 %   Grip Strength 28 kg   Flexibility 0 in   Single Leg Stand 0 seconds      Nutrition:     Nutrition Therapy & Goals - 11/09/16 1415      Nutrition Therapy   Diet Renal, Carb Modified, Therapeutic Lifestyle Changes      Personal Nutrition Goals   Nutrition Goal 1-2 lb wt loss/week to a wt loss goal of 6-24 lb at graduation from rehab     [place], educate and counsel regarding individualized specific dietary modifications aiming towards targeted core components such as weight, hypertension, lipid management, diabetes, heart failure and other comorbidities.   Expected Outcomes Short Term Goal: Understand basic principles of dietary content, such as calories, fat, sodium, cholesterol and nutrients.;Long Term Goal: Adherence to prescribed nutrition plan.      Nutrition Discharge:     Nutrition Assessments - 02/19/17 1525      MEDFICTS Scores   Post Score --  pt did not complete post assessment      Education Questionnaire Score:     Knowledge Questionnaire Score - 02/19/17 1558      Knowledge Questionnaire Score   Post Score 17/24      Goals reviewed with patient; copy given to patient. obrian graduated from cardiac rehab program today with completion of 36 exercise sessions in Phase II. Pt  maintained good attendance and progressed nicely during his participation in rehab as evidenced by increased MET level.   Medication list reconciled. Repeat  PHQ score- 0 .  Pt has made significant lifestyle changes and should be commended for his  success. Pt feels he has achieved his goals during cardiac rehab.   Pt plans to continue exercise in cardiac maintenance program. Mr [place] has enjoyed participating in cardiac rehab. We are proud of Rishan progress.Barnet Pall, RN,BSN 02/26/2017 12:07 PM

## 2017-02-05 DIAGNOSIS — D631 Anemia in chronic kidney disease: Secondary | ICD-10-CM | POA: Diagnosis not present

## 2017-02-05 DIAGNOSIS — Z23 Encounter for immunization: Secondary | ICD-10-CM | POA: Diagnosis not present

## 2017-02-05 DIAGNOSIS — D509 Iron deficiency anemia, unspecified: Secondary | ICD-10-CM | POA: Diagnosis not present

## 2017-02-05 DIAGNOSIS — E119 Type 2 diabetes mellitus without complications: Secondary | ICD-10-CM | POA: Diagnosis not present

## 2017-02-05 DIAGNOSIS — N2581 Secondary hyperparathyroidism of renal origin: Secondary | ICD-10-CM | POA: Diagnosis not present

## 2017-02-05 DIAGNOSIS — N186 End stage renal disease: Secondary | ICD-10-CM | POA: Diagnosis not present

## 2017-02-06 ENCOUNTER — Encounter (HOSPITAL_COMMUNITY): Payer: Medicare Other

## 2017-02-06 DIAGNOSIS — Z87891 Personal history of nicotine dependence: Secondary | ICD-10-CM | POA: Diagnosis not present

## 2017-02-06 DIAGNOSIS — I252 Old myocardial infarction: Secondary | ICD-10-CM | POA: Diagnosis not present

## 2017-02-06 DIAGNOSIS — Z79899 Other long term (current) drug therapy: Secondary | ICD-10-CM | POA: Diagnosis not present

## 2017-02-06 DIAGNOSIS — N186 End stage renal disease: Secondary | ICD-10-CM | POA: Diagnosis not present

## 2017-02-06 DIAGNOSIS — M1712 Unilateral primary osteoarthritis, left knee: Secondary | ICD-10-CM | POA: Diagnosis not present

## 2017-02-06 DIAGNOSIS — Z992 Dependence on renal dialysis: Secondary | ICD-10-CM | POA: Diagnosis not present

## 2017-02-06 DIAGNOSIS — I132 Hypertensive heart and chronic kidney disease with heart failure and with stage 5 chronic kidney disease, or end stage renal disease: Secondary | ICD-10-CM | POA: Diagnosis not present

## 2017-02-06 DIAGNOSIS — I5042 Chronic combined systolic (congestive) and diastolic (congestive) heart failure: Secondary | ICD-10-CM | POA: Diagnosis not present

## 2017-02-06 DIAGNOSIS — I2510 Atherosclerotic heart disease of native coronary artery without angina pectoris: Secondary | ICD-10-CM | POA: Diagnosis not present

## 2017-02-06 DIAGNOSIS — Z7982 Long term (current) use of aspirin: Secondary | ICD-10-CM | POA: Diagnosis not present

## 2017-02-06 DIAGNOSIS — Z881 Allergy status to other antibiotic agents status: Secondary | ICD-10-CM | POA: Diagnosis not present

## 2017-02-06 DIAGNOSIS — E1122 Type 2 diabetes mellitus with diabetic chronic kidney disease: Secondary | ICD-10-CM | POA: Diagnosis not present

## 2017-02-06 DIAGNOSIS — Z7902 Long term (current) use of antithrombotics/antiplatelets: Secondary | ICD-10-CM | POA: Diagnosis not present

## 2017-02-06 DIAGNOSIS — Z888 Allergy status to other drugs, medicaments and biological substances status: Secondary | ICD-10-CM | POA: Diagnosis not present

## 2017-02-07 ENCOUNTER — Other Ambulatory Visit: Payer: Medicare Other

## 2017-02-07 DIAGNOSIS — D509 Iron deficiency anemia, unspecified: Secondary | ICD-10-CM | POA: Diagnosis not present

## 2017-02-07 DIAGNOSIS — D631 Anemia in chronic kidney disease: Secondary | ICD-10-CM | POA: Diagnosis not present

## 2017-02-07 DIAGNOSIS — E119 Type 2 diabetes mellitus without complications: Secondary | ICD-10-CM | POA: Diagnosis not present

## 2017-02-07 DIAGNOSIS — N2581 Secondary hyperparathyroidism of renal origin: Secondary | ICD-10-CM | POA: Diagnosis not present

## 2017-02-07 DIAGNOSIS — Z23 Encounter for immunization: Secondary | ICD-10-CM | POA: Diagnosis not present

## 2017-02-07 DIAGNOSIS — N186 End stage renal disease: Secondary | ICD-10-CM | POA: Diagnosis not present

## 2017-02-07 NOTE — Patient Outreach (Addendum)
[name]) Care Management   02/07/2017  [name] December 21, 1950 [id number]  [name] is an 66 y.o. male  Subjective: Home visit completed with patient and his wife, [name]. Patient stated he is doing ok, but feels tired most of the time.   Objective:   ROS  Physical Exam  Constitutional: He is oriented to person, place, and time. He appears well-developed and well-nourished.  Cardiovascular: Normal rate, regular rhythm and normal heart sounds.   Respiratory: Effort normal and breath sounds normal.  Musculoskeletal:  Strong, equal grips bilaterally  Neurological: He is alert and oriented to person, place, and time.  Skin: Skin is warm and dry.    Encounter Medications:   Outpatient Encounter Prescriptions as of 02/07/2017  Medication Sig  . acetaminophen (TYLENOL) 325 MG tablet Take 650 mg by mouth every 6 (six) hours as needed for mild pain or headache.   . allopurinol (ZYLOPRIM) 100 MG tablet Take 1 tablet (100 mg total) by mouth daily.  [name] aspirin 81 MG tablet Take 81 mg by mouth daily.  . citalopram (CELEXA) 10 MG tablet Take 10 mg by mouth 3 (three) times a week.  . clopidogrel (PLAVIX) 75 MG tablet Take 1 tablet (75 mg total) by mouth daily with breakfast.  . colesevelam (WELCHOL) 625 MG tablet TAKE THREE TABLETS BY MOUTH TWICE DAILY WITH MEALS  . ezetimibe (ZETIA) 10 MG tablet Take 1 tablet (10 mg total) by mouth daily.  [name] gabapentin (NEURONTIN) 300 MG capsule 1-2 tabs by mouth at bedtime for sleep and pain  . HYDROcodone-acetaminophen (NORCO/VICODIN) 5-325 MG tablet Take 1 tablet by mouth every 6 (six) hours as needed for moderate pain.  . Lanthanum Carbonate 1000 MG PACK Take 3 packets by mouth 2 (two) times daily.   . methimazole (TAPAZOLE) 5 MG tablet Take 1 tablet (5 mg total) by mouth daily.  . metoprolol tartrate (LOPRESSOR) 25 MG tablet Take 1 tablet (25 mg total) by mouth 2 (two) times daily. ON NON-HD DAYS  . midodrine (PROAMATINE) 10 MG  tablet Take 1 tablet (10 mg total) by mouth See admin instructions. Once a day only on dialysis days (Tues/Thurs/Sat)  . multivitamin (RENA-VIT) TABS tablet Take 1 tablet by mouth at bedtime. Reported on 10/24/2015  . pantoprazole (PROTONIX) 40 MG tablet Take 40 mg by mouth 2 (two) times daily.   . SENSIPAR 90 MG tablet Take 90 mg by mouth 2 (two) times daily.   No facility-administered encounter medications on file as of 02/07/2017.     Functional Status:   In your present state of health, do you have any difficulty performing the following activities: 02/07/2017 12/27/2016  Hearing? N N  Vision? Y N  Difficulty concentrating or making decisions? N N  Walking or climbing stairs? Y Y  Dressing or bathing? N N  Doing errands, shopping? Y N  Preparing Food and eating ? Y -  Using the Toilet? N -  In the past six months, have you accidently leaked urine? N -  Do you have problems with loss of bowel control? N -  Managing your Medications? N -  Managing your Finances? N -  Housekeeping or managing your Housekeeping? Y -  Some recent data might be hidden    Fall/Depression Screening:    Fall Risk  02/07/2017 10/04/2016 06/25/2016  Falls in the past year? Yes - Yes  Number falls in past yr: 2 or more - 1  Injury with Fall? Yes - No  Risk Factor Category  High Fall Risk - -  Risk for fall due to : History of fall(s);Impaired balance/gait;Impaired mobility History of fall(s);Impaired balance/gait;Impaired mobility -  Follow up Falls evaluation completed;Education provided;Falls prevention discussed - -   PHQ 2/9 Scores 02/07/2017 02/01/2017 10/15/2016 12/29/2015 10/24/2015 10/21/2015 01/13/2014  PHQ - 2 Score 6 1 2  0 0 0 2  PHQ- 9 Score 11 - 6 - - - -    Assessment:   His wife reported that she is concerned about his fatigue and that the doctors have told her that it could be related to his dialysis. Patient has been on HD for 9 years. Patient stated that sometimes he experiences increased  generalized pain following dialysis.  He was diagnosed with heart failure in 2007. He is weighing himself every day. He reported that his "dry weight" today is 106 lbs at dialysis. RNCM provided education about heart failure and explained the heart failure zones. Encouraged to weigh daily at home at the same time every day (first thing in the morning after going to bathroom and before eating or drinking anything). Patient and wife verbalized understanding. Patient stated that he graduated form cardiac rehab on Monday, but doctor wants him to continue doing it. They are waiting to see if he can continue. Patient has total body weakness and has difficulty lifting his left arm. His wife reported that he has no grip in his left hand. He has an appointment with his neurologist in 2 weeks. His wife reported that he saw an orthopedic doctor due to the problems in his right hand and was told it was likely carpal tunnel syndrome. She stated that at one time, he was told it was gout and then a pinched nerve so now they are not sure exactly what is going on. RNCM assessed patient grips and he has good bilateral grips that are equal in strength. Patient reported that the loss of use of his left sife was due to something breaking down in his neck. He stated that he had surgery done and "it was rough." Patient also has an appointment on Monday with his  urologist for a follow up related to his history of prostate cancer. Patient does not urinate due to HD.  RNCM and patient's wife reviewed his list of providers: Dr. [name] - PCP Dr. Alyson Ingles - Urology Dr. Fleet Contras with [facility] - Nephrologist Dr. Maretta Los - Orthopedic Dr. Loanne Drilling - Endocrinologist (sees for both his thyroid and diabetes) Dr. [name] - Cardiologist Dr. Valeda Malm did the ablation June 25 Dr. Autumn Patty - hand doctor Dr. Corene Cornea [facility]) Dr. Delice Lesch - Neurologist Dr. Vicenta Aly - Podiatrist Dr. Halford Chessman - Pulmonologist Dialysis at [place]: Dr. Vicenta Aly has fungus in nails in big toe, has hammer toes, Pulmonology: Dr. Halford Chessman Dr. Tammi Klippel at [place] [facility] Dr. Katy Fitch - opthamologist - last year Neurosurgeon - dr. Trenton Gammon Dr. Rennis Golden - rheumatologist Patient stated that he has been bleeding in his stools at times. He stated that this has been going on for several weeks off and on. RNCM educated on importance of following up with provider and advised patient that this could also be contributing to his fatigue. His wife stated that patient does have an appointment scheduled with gastroenterologist to be evaluated. RNCM educated patient and wife and signs and symptoms to monitor and when to seek emergency care. Patient's wife stated that he is taking all of his medications as prescribed. She stated that he is using  a pillbox that she fills for him. She stated that sometimes he might ask her before taking something such as new medicines.   Patient is currently no taking anything for diabetes. His wife reported that he was taken off his metformin. His last A1C was 6.5 on 01/23/2017. Patient's wife takes him to all of his appointments. Patient and wife have no other questions or concerns at present.  Plan: Will continue to follow patient for [facility] and provide support and education.  [place] R. Naziyah Tieszen, RN, BSN, [place] Management Coordinator [phone]

## 2017-02-08 ENCOUNTER — Ambulatory Visit: Payer: Self-pay

## 2017-02-08 ENCOUNTER — Encounter (HOSPITAL_COMMUNITY): Payer: Medicare Other

## 2017-02-08 ENCOUNTER — Other Ambulatory Visit: Payer: Self-pay

## 2017-02-08 NOTE — Patient Outreach (Signed)
[place] [facility]) Care Management  02/08/17  [name] [birth year] [id number]  Received voicemail from patient's wife requesting callback.  Successful outreach completed with patient's wife, [name]. Patient identification verified.  [name] stated that [name] is having problems today, She stated that he has been bleeding today and was complaining of feeling bad. He does have an appointment on Monday at 1 and one on Wednesday.    She voiced frustration because he had been complaining so much, but is currently out with his son acting fine. RNCM provided support and advised if his symptoms continue or worsen, she should call his doctor or 911 and she verbalized understanding. Encouraged to call RNCM once patient gets back home if they have any additional needs or concerns. [place] R. Othell Jaime, RN, BSN, [place] Management Coordinator [phone]

## 2017-02-09 DIAGNOSIS — N2581 Secondary hyperparathyroidism of renal origin: Secondary | ICD-10-CM | POA: Diagnosis not present

## 2017-02-09 DIAGNOSIS — N186 End stage renal disease: Secondary | ICD-10-CM | POA: Diagnosis not present

## 2017-02-09 DIAGNOSIS — D631 Anemia in chronic kidney disease: Secondary | ICD-10-CM | POA: Diagnosis not present

## 2017-02-09 DIAGNOSIS — D509 Iron deficiency anemia, unspecified: Secondary | ICD-10-CM | POA: Diagnosis not present

## 2017-02-09 DIAGNOSIS — Z23 Encounter for immunization: Secondary | ICD-10-CM | POA: Diagnosis not present

## 2017-02-09 DIAGNOSIS — E119 Type 2 diabetes mellitus without complications: Secondary | ICD-10-CM | POA: Diagnosis not present

## 2017-02-11 DIAGNOSIS — C61 Malignant neoplasm of prostate: Secondary | ICD-10-CM | POA: Diagnosis not present

## 2017-02-12 DIAGNOSIS — D509 Iron deficiency anemia, unspecified: Secondary | ICD-10-CM | POA: Diagnosis not present

## 2017-02-12 DIAGNOSIS — D631 Anemia in chronic kidney disease: Secondary | ICD-10-CM | POA: Diagnosis not present

## 2017-02-12 DIAGNOSIS — N186 End stage renal disease: Secondary | ICD-10-CM | POA: Diagnosis not present

## 2017-02-12 DIAGNOSIS — E119 Type 2 diabetes mellitus without complications: Secondary | ICD-10-CM | POA: Diagnosis not present

## 2017-02-12 DIAGNOSIS — N2581 Secondary hyperparathyroidism of renal origin: Secondary | ICD-10-CM | POA: Diagnosis not present

## 2017-02-12 DIAGNOSIS — Z23 Encounter for immunization: Secondary | ICD-10-CM | POA: Diagnosis not present

## 2017-02-13 ENCOUNTER — Other Ambulatory Visit: Payer: Self-pay

## 2017-02-13 ENCOUNTER — Telehealth (HOSPITAL_COMMUNITY): Payer: Self-pay | Admitting: *Deleted

## 2017-02-13 ENCOUNTER — Other Ambulatory Visit: Payer: Self-pay | Admitting: Gastroenterology

## 2017-02-13 DIAGNOSIS — R531 Weakness: Secondary | ICD-10-CM | POA: Diagnosis not present

## 2017-02-13 DIAGNOSIS — K921 Melena: Secondary | ICD-10-CM | POA: Diagnosis not present

## 2017-02-13 NOTE — Patient Outreach (Signed)
[facility]) Care Management  02/13/17  [name] October 10, 1950 [id number]  Received voicemail from patient's wife, Johnson Arizola today requesting callback.  Successful outreach completed with patient's wife, [name]. Patient identification verified. She stated that she just wanted to provide an update.  Patient went to see Dr. Paulita Fujita today and they want to do a colonoscopy next Tuesday. She stated that they are unsure where his bleeding is coming from and she is unable to verify how much bleeding patient has actually experienced because she is not present when he goes to the bathroom. She stated that they want to do the colonoscopy to see if they can find the cause. Dr. Erlinda Hong office is calling to get permission from Dr. [name] to perform this, but it is scheduled for Tuesday 02/19/2017 after his dialysis. She reported that he will be going to dialysis early at 6:30 that morning and will then go to have his colonoscopy done after that.  He also has an appointment on Monday 02/18/2017 with the neurologist.  She has no other questions or concerns at present. Encouraged to call if she has any needs.   [place] R. Elenora Hawbaker, RN, BSN, [place] Management Coordinator [phone]

## 2017-02-13 NOTE — Telephone Encounter (Signed)
[name] called to let us know pt is having blood in stool and Dr Paulita Fujita is going to do a colonoscopy w/[facility] on 5/22 and wants to know if pt can be off Plavix for 5 days.  Discussed above w/Dr Bensimhon, pt is 6 months post stent would prefer for pt to be out 1 year before stopping Plavix but since it has been 6 months can hold if necessary.  [name] is aware and will discuss w/Dr Paulita Fujita

## 2017-02-14 DIAGNOSIS — N186 End stage renal disease: Secondary | ICD-10-CM | POA: Diagnosis not present

## 2017-02-14 DIAGNOSIS — D509 Iron deficiency anemia, unspecified: Secondary | ICD-10-CM | POA: Diagnosis not present

## 2017-02-14 DIAGNOSIS — E119 Type 2 diabetes mellitus without complications: Secondary | ICD-10-CM | POA: Diagnosis not present

## 2017-02-14 DIAGNOSIS — D631 Anemia in chronic kidney disease: Secondary | ICD-10-CM | POA: Diagnosis not present

## 2017-02-14 DIAGNOSIS — N2581 Secondary hyperparathyroidism of renal origin: Secondary | ICD-10-CM | POA: Diagnosis not present

## 2017-02-14 DIAGNOSIS — Z23 Encounter for immunization: Secondary | ICD-10-CM | POA: Diagnosis not present

## 2017-02-16 DIAGNOSIS — D509 Iron deficiency anemia, unspecified: Secondary | ICD-10-CM | POA: Diagnosis not present

## 2017-02-16 DIAGNOSIS — N2581 Secondary hyperparathyroidism of renal origin: Secondary | ICD-10-CM | POA: Diagnosis not present

## 2017-02-16 DIAGNOSIS — N186 End stage renal disease: Secondary | ICD-10-CM | POA: Diagnosis not present

## 2017-02-16 DIAGNOSIS — Z23 Encounter for immunization: Secondary | ICD-10-CM | POA: Diagnosis not present

## 2017-02-16 DIAGNOSIS — E119 Type 2 diabetes mellitus without complications: Secondary | ICD-10-CM | POA: Diagnosis not present

## 2017-02-16 DIAGNOSIS — D631 Anemia in chronic kidney disease: Secondary | ICD-10-CM | POA: Diagnosis not present

## 2017-02-18 ENCOUNTER — Encounter: Payer: Self-pay | Admitting: Neurology

## 2017-02-18 ENCOUNTER — Other Ambulatory Visit: Payer: Medicare Other

## 2017-02-18 ENCOUNTER — Ambulatory Visit (INDEPENDENT_AMBULATORY_CARE_PROVIDER_SITE_OTHER): Payer: Medicare Other | Admitting: Neurology

## 2017-02-18 VITALS — BP 134/86 | HR 82 | Ht 72.0 in | Wt 235.0 lb

## 2017-02-18 DIAGNOSIS — H8112 Benign paroxysmal vertigo, left ear: Secondary | ICD-10-CM | POA: Diagnosis not present

## 2017-02-18 DIAGNOSIS — I2510 Atherosclerotic heart disease of native coronary artery without angina pectoris: Secondary | ICD-10-CM

## 2017-02-18 DIAGNOSIS — R531 Weakness: Secondary | ICD-10-CM | POA: Diagnosis not present

## 2017-02-18 DIAGNOSIS — M5412 Radiculopathy, cervical region: Secondary | ICD-10-CM

## 2017-02-18 DIAGNOSIS — G629 Polyneuropathy, unspecified: Secondary | ICD-10-CM

## 2017-02-18 NOTE — Patient Instructions (Addendum)
1. Bloodwork for B12 2. Refer to PT for radiculopathy, left-sided weakness and vestibular therapy for vertigo 3. Continue all your medications 4. Follow-up in 6 months, call for any changes

## 2017-02-18 NOTE — Progress Notes (Signed)
NEUROLOGY FOLLOW UP OFFICE NOTE  [name] [id number]  HISTORY OF PRESENT ILLNESS: I had the pleasure of seeing [name] in follow-up in the neurology clinic on 02/18/2017. He is again accompanied by his wife who helps supplement the history today. He was initially seen for memory loss, he and his wife feel these are stable and age-related. On his initial visit in March 2017, he was reporting generalized weakness as well as left-sided weakness. MRI brain without contrast did not show any acute changes. There was mild cerebral atrophy and chronic microvascular disease. EMG/NCV of the left upper and lower extremities showed a generalized sensorimotor polyneuropathy, severe in degree electrically. There was also a superimposed left C5-6 radiculopathy, moderate to severe. It was noted that multilevel lumbosacral radiculopathies affecting the L3-S1 nerve roots cannot be excluded. He presents today reporting "a whole lot of body weakness, body is just deteriorating." He continues to feel weak in general, and his whole left side is weak. He states he is not short of breath, but he has no energy or stamina, and his coordination is bad. A year ago he was able to plant a little garden but has been unable to do it this year. He denies any numbness or tingling but sometimes feels like he is wearing tight socks when he isn't. He denies any burning pain, no neck/back pain. He fell last month, no significant injuries. He has also noticed dizzy spells lasting for a minute when he is lying down and turns in bed, more to the left side. He notices this as well when standing up quickly. No associated nausea/vomiting, diplopia, dysarthria/dysphagia. No bowel/bladder dysfunction.   HPI 12/21/2015: This is a 66 yo LH man with multiple medical issues, including hypertension, hyperlipidemia, diabetes, CKD on dialysis, neuropathy, prostate cancer currently on radiation therapy who presented for evaluation of memory changes.  He denies any headaches. He reports his memory is not as good as it used to be, but "has been like this all my life." He has difficulty with name recall but remembers it 30-60 minutes after. When asked about misplacing things, he reports "my wife says I don't look for them, then they would be right in front of me." He and his wife would argue during the visit. She reports that memory change is "selective, sometimes he can remember what he wants to," then they get into arguments when she tells him he already told her something. She started noticing memory changes for a few years now. She does most of the driving now, but he denied getting lost in the past. He manages his ow medications, but his wife has to remind him what they are for. His wife is in charge of bills. She reports he is "getting meaner." His wife is concerned he has MS due to the weakness he has been reporting. He reports generalized weakness, but worse in his left hand/arm. He has to struggle getting things from above his head. He has difficulty going up stairs. He has frequent falls, reporting 2-3 falls in the past 3 months. His wife reminds him he fell in the kitchen the other day. He becomes irritated and states he would just trip over something. They reports his knees just buckle out. He feels his left leg is weaker. He denies any neck pain, he has chronic back pain and "feels beaten up" in his lower back and legs. He denies any numbness/tingling, bowel/bladder dysfunction. He has frequent dizziness, mostly when getting up quickly. He goes  for dialysis 3 times a week for the past 5-6 years. He started radiation for prostate cancer last 12/13/15. There is no family history of memory loss, he denies any head injuries or alcohol use.  PAST MEDICAL HISTORY: Past Medical History:  Diagnosis Date  . Allergic rhinitis, cause unspecified 02/24/2014  . Anemia 06/16/2011  . BENIGN PROSTATIC HYPERTROPHY 10/14/2009  . CAD, NATIVE VESSEL 02/06/2009   saw  Dr. Missy Sabins last jan 2013  . Cervical radiculopathy, chronic 02/23/2016   Right c5-6 by [facility]/EMG  . CHEST PAIN 03/29/2010  . COLONIC POLYPS, HX OF 10/14/2009  . CONGESTIVE HEART FAILURE 06/18/2007  . Dementia [id number]  . DEPRESSION 10/14/2009  . Depression 09/24/2015  . DIABETES MELLITUS, TYPE II 02/01/2010  . DIASTOLIC HEART FAILURE, CHRONIC 02/06/2009  . DIZZINESS 07/17/2010  . DYSLIPIDEMIA 06/18/2007  . DYSPNEA 10/29/2008  . ESRD (end stage renal disease) on dialysis ([place]) 08/04/2010   "TTS;  " (04/18/2015)  . FOOT PAIN 08/12/2008  . GAIT DISTURBANCE 03/03/2010  . GASTROENTERITIS, VIRAL 10/14/2009  . GERD 06/18/2007  . GOITER, MULTINODULAR 12/26/2007  . GOUT 06/18/2007  . GYNECOMASTIA 07/17/2010  . Hemodialysis access, fistula mature [facility])    Dialysis T-Th-Sa ([place]) Right upper arm fistula  . Hyperlipidemia 10/16/2011  . Hyperparathyroidism, secondary ([place]) 06/16/2011  . HYPERTENSION 06/18/2007  . Hyperthyroidism   . HYPERTHYROIDISM 02/02/2010  . Hypocalcemia 06/07/2010  . Ischemic cardiomyopathy 06/16/2011  . NECK PAIN 07/31/2010  . NSTEMI (non-ST elevated myocardial infarction) ([place]) 08/04/2016  . ONYCHOMYCOSIS, TOENAILS 12/26/2007  . OSA on CPAP 10/16/2011  . Other malaise and fatigue 11/24/2009  . PERIPHERAL NEUROPATHY 06/18/2007  . Prostate cancer ([place])   . PULMONARY NODULE, RIGHT LOWER LOBE 06/08/2009  . Renal insufficiency   . Sleep apnea    cpap machine and o2  . TRANSAMINASES, SERUM, ELEVATED 02/01/2010  . Transfusion history    none recent  . Unspecified hypotension 01/30/2010    MEDICATIONS: Current Outpatient Prescriptions on File Prior to Visit  Medication Sig Dispense Refill  . acetaminophen (TYLENOL) 325 MG tablet Take 650 mg by mouth every 6 (six) hours as needed for mild pain or headache.     . allopurinol (ZYLOPRIM) 100 MG tablet Take 1 tablet (100 mg total) by mouth daily. 90 tablet 3  . aspirin 81 MG tablet Take 81 mg by mouth daily.    . citalopram  (CELEXA) 10 MG tablet Take 10 mg by mouth 3 (three) times a week.    . clopidogrel (PLAVIX) 75 MG tablet Take 1 tablet (75 mg total) by mouth daily with breakfast. 30 tablet 6  . colesevelam (WELCHOL) 625 MG tablet TAKE THREE TABLETS BY MOUTH TWICE DAILY WITH MEALS 180 tablet 11  . ezetimibe (ZETIA) 10 MG tablet Take 1 tablet (10 mg total) by mouth daily. 90 tablet 3  . gabapentin (NEURONTIN) 300 MG capsule 1-2 tabs by mouth at bedtime for sleep and pain 180 capsule 3  . HYDROcodone-acetaminophen (NORCO/VICODIN) 5-325 MG tablet Take 1 tablet by mouth every 6 (six) hours as needed for moderate pain.    . Lanthanum Carbonate 1000 MG PACK Take 3 packets by mouth 2 (two) times daily.     . methimazole (TAPAZOLE) 5 MG tablet Take 1 tablet (5 mg total) by mouth daily. 30 tablet 11  . metoprolol tartrate (LOPRESSOR) 25 MG tablet Take 1 tablet (25 mg total) by mouth 2 (two) times daily. ON NON-HD DAYS 144 tablet 3  . midodrine (PROAMATINE) 10 MG  tablet Take 1 tablet (10 mg total) by mouth See admin instructions. Once a day only on dialysis days (Tues/Thurs/Sat) 30 tablet 0  . multivitamin (RENA-VIT) TABS tablet Take 1 tablet by mouth at bedtime. Reported on 10/24/2015    . pantoprazole (PROTONIX) 40 MG tablet Take 40 mg by mouth 2 (two) times daily.     . SENSIPAR 90 MG tablet Take 90 mg by mouth 2 (two) times daily.     No current facility-administered medications on file prior to visit.     ALLERGIES: Allergies  Allergen Reactions  . Cephalexin Swelling and Other (See Comments)    Tongue swelling  . Statins Other (See Comments)    Weak muscles  . Ciprofloxacin Rash    FAMILY HISTORY: Family History  Problem Relation Age of Onset  . Heart disease Sister   . Thyroid nodules Sister   . Heart disease Father   . Diabetes Father   . Kidney failure Father   . Hypertension Father   . Healthy Child   . Healthy Child   . Healthy Child   . Cancer Neg Hx     SOCIAL HISTORY: Social History    Social History  . Marital status: Married    Spouse name: N/A  . Number of children: 3  . Years of education: N/A   Occupational History  . 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    Social History Main Topics  . Smoking status: Former Smoker    Packs/day: 1.00    Years: 25.00    Types: Cigarettes    Quit date: 10/01/2005  . Smokeless tobacco: Former Systems developer    Quit date: 10/01/2005     Comment: Quit smoking 2007 Smoked x 25 years 1/2 ppd.  . Alcohol use No  . Drug use: No  . Sexual activity: No   Other Topics Concern  . Not on file   Social History Narrative  . No narrative on file    REVIEW OF SYSTEMS: Constitutional: No fevers, chills, or sweats, no generalized fatigue, change in appetite Eyes: No visual changes, double vision, eye pain Ear, nose and throat: No hearing loss, ear pain, nasal congestion, sore throat Cardiovascular: No chest pain, palpitations Respiratory:  No shortness of breath at rest or with exertion, wheezes GastrointestinaI: No nausea, vomiting, diarrhea, abdominal pain, fecal incontinence Genitourinary:  No dysuria, urinary retention or frequency Musculoskeletal:  No neck pain, back pain Integumentary: No rash, pruritus, skin lesions Neurological: as above Psychiatric: No depression, insomnia, anxiety Endocrine: No palpitations, fatigue, diaphoresis, mood swings, change in appetite, change in weight, increased thirst Hematologic/Lymphatic:  No anemia, purpura, petechiae. Allergic/Immunologic: no itchy/runny eyes, nasal congestion, recent allergic reactions, rashes  PHYSICAL EXAM: Vitals:   02/18/17 1328  BP: 134/86  Pulse: 82   General: No acute distress Head:  Normocephalic/atraumatic Neck: supple, no paraspinal tenderness, full range of motion Heart:  Regular rate and rhythm Lungs:  Clear to auscultation bilaterally Back: No paraspinal tenderness Skin/Extremities: No rash, no edema Neurological Exam: alert and  oriented to person, place, and time. No aphasia or dysarthria. Fund of knowledge is appropriate.  Recent and remote memory are intact.  Attention and concentration are normal.    Able to name objects and repeat phrases. Cranial nerves: Pupils equal, round, reactive to light.  Extraocular movements intact with no nystagmus. Visual fields full. Facial sensation intact. No facial asymmetry. Tongue, uvula, palate midline.  Motor: Bulk and tone normal, muscle strength 5/5 throughout except for  mild 4+/5 left proximal UE (similar to prior, denies left shoulder pain), no pronator drift.  Sensation to light touch, temperature, pin, and vibration intact.  No extinction to double simultaneous stimulation.  Deep tendon reflexes +1 throughout except for absent ankle jerks bilaterally, toes downgoing.  Finger to nose testing intact.  Gait slow and cautious with walker, no ataxia.  Romberg negative.  IMPRESSION: This is a 66 yo LH man with multiple medical issues, including hypertension, hyperlipidemia, diabetes, CKD on dialysis, neuropathy, prostate cancer, cervical spine surgery, who initially presented with memory changes and weakness. They report memory is unchanged. His main concern today is the diffuse weakness and left-sided weakness that he feels has worsened. He reported similar symptoms a year ago, MRI brain no acute changes. Check B12 level. His EMG showed a severe generalized polyneuropathy, as well as superimposed left C5-6 radiculopathy, moderate to severe. It was also noted that multilevel lumbosacral radiculopathies affecting the L3-S1 nerve roots cannot be excluded. We discussed EMG findings and likely cause of left-sided symptoms, he is agreeable to doing PT. He is also reporting positional vertigo and will be referred for vestibular therapy as well. We discussed that if symptoms continue despite PT, would recommend re-evaluation with his neurosurgeon Dr. Annette Stable. He will follow-up in 6 months and knows to call  for any changes.    Thank you for allowing me to participate in his care.  Please do not hesitate to call for any questions or concerns.  The duration of this appointment visit was 25 minutes of face-to-face time with the patient.  Greater than 50% of this time was spent in counseling, explanation of diagnosis, planning of further management, and coordination of care.   Ellouise Newer, M.D.   CC: Dr. [name]

## 2017-02-19 ENCOUNTER — Encounter (HOSPITAL_COMMUNITY): Admission: RE | Payer: Self-pay | Source: Ambulatory Visit

## 2017-02-19 ENCOUNTER — Ambulatory Visit (HOSPITAL_COMMUNITY): Admission: RE | Admit: 2017-02-19 | Payer: Medicare Other | Source: Ambulatory Visit | Admitting: Gastroenterology

## 2017-02-19 ENCOUNTER — Telehealth: Payer: Self-pay

## 2017-02-19 DIAGNOSIS — E119 Type 2 diabetes mellitus without complications: Secondary | ICD-10-CM | POA: Diagnosis not present

## 2017-02-19 DIAGNOSIS — N2581 Secondary hyperparathyroidism of renal origin: Secondary | ICD-10-CM | POA: Diagnosis not present

## 2017-02-19 DIAGNOSIS — D509 Iron deficiency anemia, unspecified: Secondary | ICD-10-CM | POA: Diagnosis not present

## 2017-02-19 DIAGNOSIS — Z23 Encounter for immunization: Secondary | ICD-10-CM | POA: Diagnosis not present

## 2017-02-19 DIAGNOSIS — N186 End stage renal disease: Secondary | ICD-10-CM | POA: Diagnosis not present

## 2017-02-19 DIAGNOSIS — D631 Anemia in chronic kidney disease: Secondary | ICD-10-CM | POA: Diagnosis not present

## 2017-02-19 LAB — VITAMIN B12: VITAMIN B 12: 663 pg/mL (ref 200–1100)

## 2017-02-19 SURGERY — COLONOSCOPY WITH PROPOFOL
Anesthesia: Monitor Anesthesia Care

## 2017-02-19 NOTE — Addendum Note (Signed)
Encounter addended by: Dorna Bloom D on: 02/19/2017  4:05 PM<BR>    Actions taken: Flowsheet accepted, Visit Navigator Flowsheet section accepted

## 2017-02-19 NOTE — Telephone Encounter (Signed)
-----   Message from [name] Sprang, MD sent at 02/19/2017  9:38 AM EDT ----- Pls let him know B12 level is normal. Thanks

## 2017-02-19 NOTE — Telephone Encounter (Signed)
Spoke with pt wife relaying message below

## 2017-02-21 DIAGNOSIS — Z23 Encounter for immunization: Secondary | ICD-10-CM | POA: Diagnosis not present

## 2017-02-21 DIAGNOSIS — D631 Anemia in chronic kidney disease: Secondary | ICD-10-CM | POA: Diagnosis not present

## 2017-02-21 DIAGNOSIS — D509 Iron deficiency anemia, unspecified: Secondary | ICD-10-CM | POA: Diagnosis not present

## 2017-02-21 DIAGNOSIS — E119 Type 2 diabetes mellitus without complications: Secondary | ICD-10-CM | POA: Diagnosis not present

## 2017-02-21 DIAGNOSIS — N186 End stage renal disease: Secondary | ICD-10-CM | POA: Diagnosis not present

## 2017-02-21 DIAGNOSIS — N2581 Secondary hyperparathyroidism of renal origin: Secondary | ICD-10-CM | POA: Diagnosis not present

## 2017-02-23 DIAGNOSIS — N2581 Secondary hyperparathyroidism of renal origin: Secondary | ICD-10-CM | POA: Diagnosis not present

## 2017-02-23 DIAGNOSIS — Z23 Encounter for immunization: Secondary | ICD-10-CM | POA: Diagnosis not present

## 2017-02-23 DIAGNOSIS — D631 Anemia in chronic kidney disease: Secondary | ICD-10-CM | POA: Diagnosis not present

## 2017-02-23 DIAGNOSIS — N186 End stage renal disease: Secondary | ICD-10-CM | POA: Diagnosis not present

## 2017-02-23 DIAGNOSIS — E119 Type 2 diabetes mellitus without complications: Secondary | ICD-10-CM | POA: Diagnosis not present

## 2017-02-23 DIAGNOSIS — D509 Iron deficiency anemia, unspecified: Secondary | ICD-10-CM | POA: Diagnosis not present

## 2017-02-26 DIAGNOSIS — N2581 Secondary hyperparathyroidism of renal origin: Secondary | ICD-10-CM | POA: Diagnosis not present

## 2017-02-26 DIAGNOSIS — E119 Type 2 diabetes mellitus without complications: Secondary | ICD-10-CM | POA: Diagnosis not present

## 2017-02-26 DIAGNOSIS — D509 Iron deficiency anemia, unspecified: Secondary | ICD-10-CM | POA: Diagnosis not present

## 2017-02-26 DIAGNOSIS — D631 Anemia in chronic kidney disease: Secondary | ICD-10-CM | POA: Diagnosis not present

## 2017-02-26 DIAGNOSIS — Z23 Encounter for immunization: Secondary | ICD-10-CM | POA: Diagnosis not present

## 2017-02-26 DIAGNOSIS — N186 End stage renal disease: Secondary | ICD-10-CM | POA: Diagnosis not present

## 2017-02-27 ENCOUNTER — Ambulatory Visit: Payer: Medicare Other | Attending: Neurology | Admitting: Physical Therapy

## 2017-02-27 ENCOUNTER — Encounter: Payer: Self-pay | Admitting: Physical Therapy

## 2017-02-27 DIAGNOSIS — R296 Repeated falls: Secondary | ICD-10-CM

## 2017-02-27 DIAGNOSIS — R262 Difficulty in walking, not elsewhere classified: Secondary | ICD-10-CM

## 2017-02-27 DIAGNOSIS — M6281 Muscle weakness (generalized): Secondary | ICD-10-CM | POA: Diagnosis not present

## 2017-02-27 NOTE — Therapy (Signed)
[address] [place], [place], [address] Phone: [phone]   Fax:  [phone]  Physical Therapy Evaluation  Patient Details  Name: [name] MRN: [id number] Date of Birth: 30-Dec-1950 Referring Provider: Ellouise Newer  Encounter Date: 02/27/2017      PT End of Session - 02/27/17 1421    Visit Number 1   Date for PT Re-Evaluation 04/29/17   PT Start Time 1357   PT Stop Time 1445   PT Time Calculation (min) 48 min   Activity Tolerance Patient tolerated treatment well   Behavior During Therapy [facility] for tasks assessed/performed      Past Medical History:  Diagnosis Date  . Allergic rhinitis, cause unspecified 02/24/2014  . Anemia 06/16/2011  . BENIGN PROSTATIC HYPERTROPHY 10/14/2009  . CAD, NATIVE VESSEL 02/06/2009   saw Dr. Missy Sabins last jan 2013  . Cervical radiculopathy, chronic 02/23/2016   Right c5-6 by NCS/EMG  . CHEST PAIN 03/29/2010  . COLONIC POLYPS, HX OF 10/14/2009  . CONGESTIVE HEART FAILURE 06/18/2007  . Dementia [id number]  . DEPRESSION 10/14/2009  . Depression 09/24/2015  . DIABETES MELLITUS, TYPE II 02/01/2010  . DIASTOLIC HEART FAILURE, CHRONIC 02/06/2009  . DIZZINESS 07/17/2010  . DYSLIPIDEMIA 06/18/2007  . DYSPNEA 10/29/2008  . ESRD (end stage renal disease) on dialysis ([place]) 08/04/2010   "TTS;  " (04/18/2015)  . FOOT PAIN 08/12/2008  . GAIT DISTURBANCE 03/03/2010  . GASTROENTERITIS, VIRAL 10/14/2009  . GERD 06/18/2007  . GOITER, MULTINODULAR 12/26/2007  . GOUT 06/18/2007  . GYNECOMASTIA 07/17/2010  . Hemodialysis access, fistula mature [facility])    Dialysis [facility]-Sa ([place]) Right upper arm fistula  . Hyperlipidemia 10/16/2011  . Hyperparathyroidism, secondary ([place]) 06/16/2011  . HYPERTENSION 06/18/2007  . Hyperthyroidism   . HYPERTHYROIDISM 02/02/2010  . Hypocalcemia 06/07/2010  . Ischemic cardiomyopathy 06/16/2011  . NECK PAIN 07/31/2010  . NSTEMI (non-ST elevated myocardial  infarction) ([place]) 08/04/2016  . ONYCHOMYCOSIS, TOENAILS 12/26/2007  . OSA on CPAP 10/16/2011  . Other malaise and fatigue 11/24/2009  . PERIPHERAL NEUROPATHY 06/18/2007  . Prostate cancer ([place])   . PULMONARY NODULE, RIGHT LOWER LOBE 06/08/2009  . Renal insufficiency   . Sleep apnea    cpap machine and o2  . TRANSAMINASES, SERUM, ELEVATED 02/01/2010  . Transfusion history    none recent  . Unspecified hypotension 01/30/2010    Past Surgical History:  Procedure Laterality Date  . ARTERIOVENOUS GRAFT PLACEMENT Right 2009   forearm/notes 02/01/2011  . AV FISTULA PLACEMENT  11/07/2011   Procedure: INSERTION OF ARTERIOVENOUS (AV) GORE-TEX GRAFT ARM;  Surgeon: Tinnie Gens, MD;  Location: [place];  Service: Vascular;  Laterality: Left;  . BACK SURGERY  1998  . BASCILIC VEIN TRANSPOSITION Right 02/27/2013   Procedure: BASCILIC VEIN TRANSPOSITION;  Surgeon: Mal Misty, MD;  Location: [place];  Service: Vascular;  Laterality: Right;  Right Basilic Vein Transposition   . CARDIAC CATHETERIZATION N/A 08/06/2016   Procedure: Left Heart Cath and Coronary Angiography;  Surgeon: Jolaine Artist, MD;  Location: [place] CV LAB;  Service: Cardiovascular;  Laterality: N/A;  . CARDIAC CATHETERIZATION N/A 08/07/2016   Procedure: Coronary/Graft Atherectomy-CSI LAD;  Surgeon: Peter M Martinique, MD;  Location: [place] CV LAB;  Service: Cardiovascular;  Laterality: N/A;  . CERVICAL SPINE SURGERY  2/09   "to repair nerve problems in my left arm"  . CHOLECYSTECTOMY    . CORONARY ANGIOPLASTY WITH STENT PLACEMENT  06/11/2008  .  CORONARY ANGIOPLASTY WITH STENT PLACEMENT  06/2007   TAXUS stent to RCA/notes 01/31/2011  . ESOPHAGOGASTRODUODENOSCOPY  09/28/2011   Procedure: ESOPHAGOGASTRODUODENOSCOPY (EGD);  Surgeon: Missy Sabins, MD;  Location: [facility] ENDOSCOPY;  Service: Endoscopy;  Laterality: N/A;  . ESOPHAGOGASTRODUODENOSCOPY N/A 04/07/2015   Procedure: ESOPHAGOGASTRODUODENOSCOPY (EGD);  Surgeon: Teena Irani, MD;  Location: [facility]  END[facility];  Service: Endoscopy;  Laterality: N/A;  . ESOPHAGOGASTRODUODENOSCOPY N/A 04/19/2015   Procedure: ESOPHAGOGASTRODUODENOSCOPY (EGD);  Surgeon: Arta Silence, MD;  Location[facility] ENDOSCOPY;  Service: Endoscopy;  Laterality: N/A;  . FOREIGN BODY REMOVAL  09/2003   via upper endoscopy/notes 02/12/2011  . GIVENS CAPSULE STUDY  09/30/2011   Procedure: GIVENS CAPSULE STUDY;  Surgeon: Jeryl Columbia, MD;  Location: [facility] ENDOSCOPY;  Service: Endoscopy;  Laterality: N/A;  . INSERTION OF DIALYSIS CATHETER Right 2014  . INSERTION OF DIALYSIS CATHETER Left 02/11/2013   Procedure: INSERTION OF DIALYSIS CATHETER;  Surgeon: Conrad Momence, MD;  Location: [place];  Service: Vascular;  Laterality: Left;  Ultrasound guided  . REMOVAL OF A DIALYSIS CATHETER Right 02/11/2013   Procedure: REMOVAL OF A DIALYSIS CATHETER;  Surgeon: Conrad Midlothian, MD;  Location: [place];  Service: Vascular;  Laterality: Right;  . SAVORY DILATION N/A 04/07/2015   Procedure: SAVORY DILATION;  Surgeon: Teena Irani, MD;  Location: [facility] ENDOSCOPY;  Service: Endoscopy;  Laterality: N/A;  . SHUNTOGRAM N/A 09/20/2011   Procedure: Earney Mallet;  Surgeon: Conrad Jarales, MD;  Location: [facility] CATH LAB;  Service: Cardiovascular;  Laterality: N/A;  . SVT ABLATION N/A 11/26/2016   Procedure: SVT Ablation;  Surgeon: Will Meredith Leeds, MD;  Location: [place] CV LAB;  Service: Cardiovascular;  Laterality: N/A;  . TONSILLECTOMY    . TOTAL KNEE ARTHROPLASTY Right 08/02/2015   Procedure: TOTAL KNEE ARTHROPLASTY;  Surgeon: Renette Butters, MD;  Location: [place];  Service: Orthopedics;  Laterality: Right;  . VENOGRAM N/A 01/26/2013   Procedure: VENOGRAM;  Surgeon: Angelia Mould, MD;  Location: [facility] CATH LAB;  Service: Cardiovascular;  Laterality: N/A;    There were no vitals filed for this visit.       Subjective Assessment - 02/27/17 1400    Subjective Patient reports that he has had about 3 falls over the past year, he reports weakness mostly on the left  side, difficulty walking and getting up and down from sitting, reports that he had to start using a walker about a year ago.  I just feel weak   Pertinent History ESRD, prostate CA, on dialysis, neuropathy, back surgery and neck surgery "years ago   Limitations Walking   Patient Stated Goals be stronger, walk better   Currently in Pain? No/denies            [facility] PT Assessment - 02/27/17 0001      Assessment   Medical Diagnosis weakness, difficulty walking   Referring Provider Ellouise Newer   Onset Date/Surgical Date 02/13/17   Prior Therapy a few years ago     Precautions   Precautions Fall     Balance Screen   Has the patient fallen in the past 6 months Yes   How many times? 3   Has the patient had a decrease in activity level because of a fear of falling?  Yes   Is the patient reluctant to leave their home because of a fear of falling?  Yes     Home Environment   Additional Comments steps into the home, reports he does some housework, reports  he mows his grass with a riding mower     Prior Function   Level of Independence Independent with household mobility with device   Vocation Retired   Leisure no exercise     ROM / Strength   AROM / PROM / Strength AROM;Strength     AROM   Overall AROM Comments WFL for knees and ankles     Strength   Overall Strength Comments hips 3+/5, knees 3+/5, ankles 3+/5, shoulders right 4-/5, left shoulder 3+/5, elbow on the left 3+/5, right elbow 4-/5     Palpation   Palpation comment mm atrophy in the legs,      Ambulation/Gait   Gait Comments uses a 4 WW, slow, bent over posture, holds walker out in front and leans on it, has some hard foot fall due to weakness of the DF, bilaterally, feet slap the fllor     Standardized Balance Assessment   Standardized Balance Assessment Berg Balance Test;Timed Up and Go Test     Berg Balance Test   Sit to Stand Able to stand  independently using hands   Standing Unsupported Able to stand 30  seconds unsupported   Sitting with Back Unsupported but Feet Supported on Floor or Stool Able to sit safely and securely 2 minutes   Stand to Sit Controls descent by using hands   Transfers Able to transfer safely, definite need of hands   Standing Unsupported with Eyes Closed Unable to keep eyes closed 3 seconds but stays steady   Standing Ubsupported with Feet Together Able to place feet together independently but unable to hold for 30 seconds   From Standing, Reach Forward with Outstretched Arm Can reach forward >5 cm safely (2")   From Standing Position, Pick up Object from Floor Unable to pick up and needs supervision   From Standing Position, Turn to Look Behind Over each Shoulder Needs supervision when turning   Turn 360 Degrees Needs assistance while turning   Standing Unsupported, Alternately Place Feet on Step/Stool Needs assistance to keep from falling or unable to try   Standing Unsupported, One Foot in [employer] balance while stepping or standing   Standing on One Leg Unable to try or needs assist to prevent fall   Total Score 22     Timed Up and Go Test   Normal TUG (seconds) 26            Objective measurements completed on examination: See above findings.          [facility] Adult PT Treatment/Exercise - 02/27/17 0001      Exercises   Exercises Knee/Hip     Knee/Hip Exercises: Aerobic   Nustep level 3 x 6 minutes   Other Aerobic UBE level 3 x 4 minutes                  PT Short Term Goals - 02/27/17 1424      PT SHORT TERM GOAL #1   Title indepednent with HEP   Time 2   Period Weeks   Status New           PT Long Term Goals - 02/27/17 1424      PT LONG TERM GOAL #1   Title decrease TUG time to 18 seconds   Time 8   Period Weeks   Status New     PT LONG TERM GOAL #2   Title increase Berg balance test score to 36/56   Time 8  Period Weeks   Status New     PT LONG TERM GOAL #3   Title walk 500 feet without rest   Time 8    Period Weeks   Status New     PT LONG TERM GOAL #4   Title increase LE strength to 4/5   Time 8   Period Weeks   Status New                Plan - 02/27/17 1422    Clinical Impression Statement Patient has a myriad of health issues, the reason he is her eto see PT is 3 falls in the pst year, him having to use a 4WW over the past year, decreased strength and difficulty walking.  His TUG was 26 seconds, his Berg balance was 22/56 putting him at a very high risk for falls   History and Personal Factors relevant to plan of care: ESRD, on dialysis 3x/week, 3 falls, neuropathy   Clinical Presentation Evolving   Clinical Decision Making Moderate   Rehab Potential Fair   PT Frequency 2x / week   PT Duration 8 weeks   PT Treatment/Interventions ADLs/Self Care Home Management;Gait training;Stair training;Functional mobility training;Patient/family education;Neuromuscular re-education;Balance training;Therapeutic exercise;Therapeutic activities;Manual techniques   PT Next Visit Plan add exercises for strength and balance   Consulted and Agree with Plan of Care Patient      Patient will benefit from skilled therapeutic intervention in order to improve the following deficits and impairments:  Abnormal gait, Cardiopulmonary status limiting activity, Decreased activity tolerance, Decreased balance, Decreased mobility, Decreased strength, Difficulty walking  Visit Diagnosis: Muscle weakness (generalized) - Plan: PT plan of care cert/re-cert  Difficulty in walking, not elsewhere classified - Plan: PT plan of care cert/re-cert  Repeated falls - Plan: PT plan of care cert/re-cert      G-Codes - 63/14/97 1426    Functional Assessment Tool Used (Outpatient Only) foto 70%   Functional Limitation Mobility: Walking and moving around   Mobility: Walking and Moving Around Current Status ([id number]) At least 60 percent but less than 80 percent impaired, limited or restricted   Mobility: Walking and  Moving Around Goal Status [phone]) At least 40 percent but less than 60 percent impaired, limited or restricted       Problem List Patient Active Problem List   Diagnosis Date Noted  . Weakness 12/27/2016  . Generalized weakness 12/27/2016  . SVT (supraventricular tachycardia) ([place]) 10/16/2016  . Abdominal pain 08/27/2016  . Abnormal findings on diagnostic imaging of liver and biliary tract 08/27/2016  . Bloating symptom 08/27/2016  . Hematochezia 08/27/2016  . Early satiety 08/27/2016  . Eructation 08/27/2016  . Esophageal ulcer 08/27/2016  . Flatulence, eructation and gas pain 08/27/2016  . General weakness 08/27/2016  . Skin sensation disturbance 08/27/2016  . Stricture of esophagus 08/27/2016  . Alternating constipation and diarrhea 08/16/2016  . Status post coronary artery stent placement   . Abnormal EKG   . ST segment depression 08/05/2016  . PAD (peripheral artery disease) ([place]) 06/18/2016  . Transient hypotension 04/29/2016  . Cervical radiculopathy, chronic 02/23/2016  . Memory loss 12/22/2015  . Left-sided weakness 12/22/2015  . Peripheral polyneuropathy 12/22/2015  . DM (diabetes mellitus) type II controlled with renal manifestation ([place]) 10/31/2015  . Nausea and vomiting 10/31/2015  . Malignant neoplasm of prostate ([place]) 10/24/2015  . Right leg pain 09/04/2015  . Chest pain with high risk for cardiac etiology 08/09/2015  . Acute encephalopathy 08/09/2015  . S/P right total  knee replacement 08/09/2015  . Primary osteoarthritis of right knee 08/05/2015  . Neuropathy due to secondary diabetes mellitus ([place]) 08/05/2015  . Arthritis of knee 08/02/2015  . Headache 06/08/2015  . Melena 04/19/2015  . Acute blood loss anemia 04/18/2015  . Hyperthyroidism 03/11/2015  . Thigh pain 12/08/2014  . Nodule of right lung 09/14/2014  . PSA elevation 08/30/2014  . ESRD on hemodialysis ([place]) 08/16/2014  . Essential hypertension 08/16/2014  . Pre-transplant evaluation for  kidney transplant 08/16/2014  . Allergic rhinitis 02/24/2014  . Elevated PSA 02/24/2014  . Vertigo 10/14/2013  . Cough 06/26/2013  . Numbness 05/15/2013  . Chronic systolic heart failure ([place]) 02/09/2013  . Other complications due to renal dialysis device, implant, and graft 01/14/2013  . Right hand pain 03/28/2012  . Dysphagia 03/28/2012  . Hypogonadism, male 03/28/2012  . OSA (obstructive sleep apnea) 10/16/2011  . Hyperlipidemia 10/16/2011  . CAD (coronary artery disease) 09/28/2011  . NSTEMI (non-ST elevated myocardial infarction) ([place]) 09/28/2011  . AVM (arteriovenous malformation) 09/27/2011  . Anemia associated with acute blood loss 09/26/2011  . Anemia 06/16/2011  . Ischemic cardiomyopathy 06/16/2011  . Hyperparathyroidism, secondary ([place]) 06/16/2011  . Preventative health care 03/11/2011  . NECK PAIN 07/31/2010  . GYNECOMASTIA 07/17/2010  . DIZZINESS 07/17/2010  . GAIT DISTURBANCE 03/03/2010  . Thyrotoxicosis 02/02/2010  . Elevated levels of transaminase & lactic acid dehydrogenase 02/01/2010  . Other malaise and fatigue 11/24/2009  . Depression 10/14/2009  . BENIGN PROSTATIC HYPERTROPHY 10/14/2009  . COLONIC POLYPS, HX OF 10/14/2009  . Dementia 09/02/2009  . PULMONARY NODULE, RIGHT LOWER LOBE 06/08/2009  . Chronic diastolic congestive heart failure ([place]) 02/06/2009  . DYSPNEA 10/29/2008  . FOOT PAIN 08/12/2008  . ONYCHOMYCOSIS, TOENAILS 12/26/2007  . GOITER, MULTINODULAR 12/26/2007  . Gout 06/18/2007  . Neuropathy ([place]) 06/18/2007  . Hypertensive heart disease with CHF (congestive heart failure) ([place]) 06/18/2007  . Gastro-esophageal reflux disease without esophagitis 06/18/2007    Sumner Boast., PT 02/27/2017, 2:31 PM  [address] [place], [place], [address] Phone: [phone]   Fax:  [phone]  Name: [name] MRN: [id number] Date of Birth: 01/23/1951

## 2017-02-28 DIAGNOSIS — E1129 Type 2 diabetes mellitus with other diabetic kidney complication: Secondary | ICD-10-CM | POA: Diagnosis not present

## 2017-02-28 DIAGNOSIS — D631 Anemia in chronic kidney disease: Secondary | ICD-10-CM | POA: Diagnosis not present

## 2017-02-28 DIAGNOSIS — Z23 Encounter for immunization: Secondary | ICD-10-CM | POA: Diagnosis not present

## 2017-02-28 DIAGNOSIS — D509 Iron deficiency anemia, unspecified: Secondary | ICD-10-CM | POA: Diagnosis not present

## 2017-02-28 DIAGNOSIS — Z992 Dependence on renal dialysis: Secondary | ICD-10-CM | POA: Diagnosis not present

## 2017-02-28 DIAGNOSIS — N2581 Secondary hyperparathyroidism of renal origin: Secondary | ICD-10-CM | POA: Diagnosis not present

## 2017-02-28 DIAGNOSIS — N186 End stage renal disease: Secondary | ICD-10-CM | POA: Diagnosis not present

## 2017-02-28 DIAGNOSIS — E119 Type 2 diabetes mellitus without complications: Secondary | ICD-10-CM | POA: Diagnosis not present

## 2017-03-01 ENCOUNTER — Other Ambulatory Visit: Payer: Self-pay

## 2017-03-01 ENCOUNTER — Other Ambulatory Visit (HOSPITAL_COMMUNITY): Payer: Self-pay | Admitting: Internal Medicine

## 2017-03-01 ENCOUNTER — Telehealth (HOSPITAL_COMMUNITY): Payer: Self-pay | Admitting: *Deleted

## 2017-03-01 NOTE — Patient Outreach (Signed)
[facility]) Care Management  03/01/17  WYLAND RASTETTER 12-Feb-1951 [id number]  Attempted to reach patient without success. Left HIPAA compliant voicemail with patient's son who lives in the home and requested callback.  [place] R. Sanaa Zilberman, RN, BSN, [place] Management Coordinator [phone]

## 2017-03-02 DIAGNOSIS — Z23 Encounter for immunization: Secondary | ICD-10-CM | POA: Diagnosis not present

## 2017-03-02 DIAGNOSIS — D631 Anemia in chronic kidney disease: Secondary | ICD-10-CM | POA: Diagnosis not present

## 2017-03-02 DIAGNOSIS — E119 Type 2 diabetes mellitus without complications: Secondary | ICD-10-CM | POA: Diagnosis not present

## 2017-03-02 DIAGNOSIS — N186 End stage renal disease: Secondary | ICD-10-CM | POA: Diagnosis not present

## 2017-03-02 DIAGNOSIS — N2581 Secondary hyperparathyroidism of renal origin: Secondary | ICD-10-CM | POA: Diagnosis not present

## 2017-03-02 DIAGNOSIS — D509 Iron deficiency anemia, unspecified: Secondary | ICD-10-CM | POA: Diagnosis not present

## 2017-03-04 ENCOUNTER — Encounter: Payer: Self-pay | Admitting: Physical Therapy

## 2017-03-04 ENCOUNTER — Ambulatory Visit: Payer: Medicare Other | Attending: Neurology | Admitting: Physical Therapy

## 2017-03-04 DIAGNOSIS — R262 Difficulty in walking, not elsewhere classified: Secondary | ICD-10-CM | POA: Insufficient documentation

## 2017-03-04 DIAGNOSIS — M6281 Muscle weakness (generalized): Secondary | ICD-10-CM | POA: Insufficient documentation

## 2017-03-04 DIAGNOSIS — R296 Repeated falls: Secondary | ICD-10-CM

## 2017-03-04 NOTE — Therapy (Signed)
[place], [place], [address] Phone: [phone]   Fax:  [phone]  Physical Therapy Treatment  Patient Details  Name: [name] MRN: [id number] Date of Birth: August 16, 1951 Referring Provider: Ellouise Newer  Encounter Date: 03/04/2017      PT End of Session - 03/04/17 1507    Visit Number 2   Date for PT Re-Evaluation 04/29/17   PT Start Time 1430   PT Stop Time 1515   PT Time Calculation (min) 45 min   Activity Tolerance Patient tolerated treatment well   Behavior During Therapy [facility] for tasks assessed/performed      Past Medical History:  Diagnosis Date  . Allergic rhinitis, cause unspecified 02/24/2014  . Anemia 06/16/2011  . BENIGN PROSTATIC HYPERTROPHY 10/14/2009  . CAD, NATIVE VESSEL 02/06/2009   saw Dr. Missy Sabins last jan 2013  . Cervical radiculopathy, chronic 02/23/2016   Right c5-6 by NCS/EMG  . CHEST PAIN 03/29/2010  . COLONIC POLYPS, HX OF 10/14/2009  . CONGESTIVE HEART FAILURE 06/18/2007  . Dementia [id number]  . DEPRESSION 10/14/2009  . Depression 09/24/2015  . DIABETES MELLITUS, TYPE II 02/01/2010  . DIASTOLIC HEART FAILURE, CHRONIC 02/06/2009  . DIZZINESS 07/17/2010  . DYSLIPIDEMIA 06/18/2007  . DYSPNEA 10/29/2008  . ESRD (end stage renal disease) on dialysis ([place]) 08/04/2010   "TTS;  " (04/18/2015)  . FOOT PAIN 08/12/2008  . GAIT DISTURBANCE 03/03/2010  . GASTROENTERITIS, VIRAL 10/14/2009  . GERD 06/18/2007  . GOITER, MULTINODULAR 12/26/2007  . GOUT 06/18/2007  . GYNECOMASTIA 07/17/2010  . Hemodialysis access, fistula mature [facility])    Dialysis T-Th-Sa ([place]) Right upper arm fistula  . Hyperlipidemia 10/16/2011  . Hyperparathyroidism, secondary ([place]) 06/16/2011  . HYPERTENSION 06/18/2007  . Hyperthyroidism   . HYPERTHYROIDISM 02/02/2010  . Hypocalcemia 06/07/2010  . Ischemic cardiomyopathy 06/16/2011  . NECK PAIN 07/31/2010  . NSTEMI (non-ST elevated myocardial  infarction) ([place]) 08/04/2016  . ONYCHOMYCOSIS, TOENAILS 12/26/2007  . OSA on CPAP 10/16/2011  . Other malaise and fatigue 11/24/2009  . PERIPHERAL NEUROPATHY 06/18/2007  . Prostate cancer ([place])   . PULMONARY NODULE, RIGHT LOWER LOBE 06/08/2009  . Renal insufficiency   . Sleep apnea    cpap machine and o2  . TRANSAMINASES, SERUM, ELEVATED 02/01/2010  . Transfusion history    none recent  . Unspecified hypotension 01/30/2010    Past Surgical History:  Procedure Laterality Date  . ARTERIOVENOUS GRAFT PLACEMENT Right 2009   forearm/notes 02/01/2011  . AV FISTULA PLACEMENT  11/07/2011   Procedure: INSERTION OF ARTERIOVENOUS (AV) GORE-TEX GRAFT ARM;  Surgeon: Tinnie Gens, MD;  Location: [place];  Service: Vascular;  Laterality: Left;  . BACK SURGERY  1998  . BASCILIC VEIN TRANSPOSITION Right 02/27/2013   Procedure: BASCILIC VEIN TRANSPOSITION;  Surgeon: Mal Misty, MD;  Location: [place];  Service: Vascular;  Laterality: Right;  Right Basilic Vein Transposition   . CARDIAC CATHETERIZATION N/A 08/06/2016   Procedure: Left Heart Cath and Coronary Angiography;  Surgeon: Jolaine Artist, MD;  Location: [place] CV LAB;  Service: Cardiovascular;  Laterality: N/A;  . CARDIAC CATHETERIZATION N/A 08/07/2016   Procedure: Coronary/Graft Atherectomy-CSI LAD;  Surgeon: Peter M Martinique, MD;  Location: [place] CV LAB;  Service: Cardiovascular;  Laterality: N/A;  . CERVICAL SPINE SURGERY  2/09   "to repair nerve problems in my left arm"  . CHOLECYSTECTOMY    . CORONARY ANGIOPLASTY WITH STENT PLACEMENT  06/11/2008  .  CORONARY ANGIOPLASTY WITH STENT PLACEMENT  06/2007   TAXUS stent to RCA/notes 01/31/2011  . ESOPHAGOGASTRODUODENOSCOPY  09/28/2011   Procedure: ESOPHAGOGASTRODUODENOSCOPY (EGD);  Surgeon: Missy Sabins, MD;  Location[facility] ENDOSCOPY;  Service: Endoscopy;  Laterality: N/A;  . ESOPHAGOGASTRODUODENOSCOPY N/A 04/07/2015   Procedure: ESOPHAGOGASTRODUODENOSCOPY (EGD);  Surgeon: Teena Irani, MD;  Location: [facility]  ENDOS[facility];  Service: Endoscopy;  Laterality: N/A;  . ESOPHAGOGASTRODUODENOSCOPY N/A 04/19/2015   Procedure: ESOPHAGOGASTRODUODENOSCOPY (EGD);  Surgeon: Arta Silence, MD;  Location: [facility] ENDOSCOPY;  Service: Endoscopy;  Laterality: N/A;  . FOREIGN BODY REMOVAL  09/2003   via upper endoscopy/notes 02/12/2011  . GIVENS CAPSULE STUDY  09/30/2011   Procedure: GIVENS CAPSULE STUDY;  Surgeon: Jeryl Columbia, MD;  Location: [facility] ENDOSCOPY;  Service: Endoscopy;  Laterality: N/A;  . INSERTION OF DIALYSIS CATHETER Right 2014  . INSERTION OF DIALYSIS CATHETER Left 02/11/2013   Procedure: INSERTION OF DIALYSIS CATHETER;  Surgeon: Conrad Boone, MD;  Location: [place];  Service: Vascular;  Laterality: Left;  Ultrasound guided  . REMOVAL OF A DIALYSIS CATHETER Right 02/11/2013   Procedure: REMOVAL OF A DIALYSIS CATHETER;  Surgeon: Conrad [place], MD;  Location: [place];  Service: Vascular;  Laterality: Right;  . SAVORY DILATION N/A 04/07/2015   Procedure: SAVORY DILATION;  Surgeon: Teena Irani, MD;  [facility]: [facility] ENDOSCOPY;  Service: Endoscopy;  Laterality: N/A;  . SHUNTOGRAM N/A 09/20/2011   Procedure: Earney Mallet;  Surgeon: Conrad [place], MD;  Location: [facility] CATH LAB;  Service: Cardiovascular;  Laterality: N/A;  . SVT ABLATION N/A 11/26/2016   Procedure: SVT Ablation;  Surgeon: Will Meredith Leeds, MD;  Location: [place] CV LAB;  Service: Cardiovascular;  Laterality: N/A;  . TONSILLECTOMY    . TOTAL KNEE ARTHROPLASTY Right 08/02/2015   Procedure: TOTAL KNEE ARTHROPLASTY;  Surgeon: Renette Butters, MD;  Location: [place];  Service: Orthopedics;  Laterality: Right;  . VENOGRAM N/A 01/26/2013   Procedure: VENOGRAM;  Surgeon: Angelia Mould, MD;  Location: [facility] CATH LAB;  Service: Cardiovascular;  Laterality: N/A;    There were no vitals filed for this visit.      Subjective Assessment - 03/04/17 1431    Subjective "No feeling the best today, I just feel a little light headed."   Currently in Pain? No/denies                          [facility] Adult PT Treatment/Exercise - 03/04/17 0001      Exercises   Exercises Lumbar     Lumbar Exercises: Machines for Strengthening   Cybex Knee Extension 10lb 2x10    Cybex Knee Flexion 25lb 2x10   Cybex Leg Press 20lb 3x10     Lumbar Exercises: Seated   Other Seated Lumbar Exercises Tband rows black 2x10      Knee/Hip Exercises: Aerobic   Nustep level 3 x 6 minutes   Other Aerobic UBE level 3 x 4 minutes     Knee/Hip Exercises: Seated   Sit to Sand 5 reps;with UE support;2 sets                  PT Short Term Goals - 02/27/17 1424      PT SHORT TERM GOAL #1   Title indepednent with HEP   Time 2   Period Weeks   Status New           PT Long Term Goals - 02/27/17 1424      PT  LONG TERM GOAL #1   Title decrease TUG time to 18 seconds   Time 8   Period Weeks   Status New     PT LONG TERM GOAL #2   Title increase Berg balance test score to 36/56   Time 8   Period Weeks   Status New     PT LONG TERM GOAL #3   Title walk 500 feet without rest   Time 8   Period Weeks   Status New     PT LONG TERM GOAL #4   Title increase LE strength to 4/5   Time 8   Period Weeks   Status New               Plan - 03/04/17 1508    Clinical Impression Statement Pt tolerated an initial progression to exercises well. Reports no increase in pain throughout treatment session. Does reports that his legs feel like rubber afterwards.     Rehab Potential Fair   PT Frequency 2x / week   PT Duration 8 weeks   PT Treatment/Interventions ADLs/Self Care Home Management;Gait training;Stair training;Functional mobility training;Patient/family education;Neuromuscular re-education;Balance training;Therapeutic exercise;Therapeutic activities;Manual techniques   PT Next Visit Plan add exercises for strength and balance      Patient will benefit from skilled therapeutic intervention in order to improve the following deficits and  impairments:  Abnormal gait, Cardiopulmonary status limiting activity, Decreased activity tolerance, Decreased balance, Decreased mobility, Decreased strength, Difficulty walking  Visit Diagnosis: Muscle weakness (generalized)  Difficulty in walking, not elsewhere classified  Repeated falls     Problem List Patient Active Problem List   Diagnosis Date Noted  . Weakness 12/27/2016  . Generalized weakness 12/27/2016  . SVT (supraventricular tachycardia) ([place]) 10/16/2016  . Abdominal pain 08/27/2016  . Abnormal findings on diagnostic imaging of liver and biliary tract 08/27/2016  . Bloating symptom 08/27/2016  . Hematochezia 08/27/2016  . Early satiety 08/27/2016  . Eructation 08/27/2016  . Esophageal ulcer 08/27/2016  . Flatulence, eructation and gas pain 08/27/2016  . General weakness 08/27/2016  . Skin sensation disturbance 08/27/2016  . Stricture of esophagus 08/27/2016  . Alternating constipation and diarrhea 08/16/2016  . Status post coronary artery stent placement   . Abnormal EKG   . ST segment depression 08/05/2016  . PAD (peripheral artery disease) ([place]) 06/18/2016  . Transient hypotension 04/29/2016  . Cervical radiculopathy, chronic 02/23/2016  . Memory loss 12/22/2015  . Left-sided weakness 12/22/2015  . Peripheral polyneuropathy 12/22/2015  . DM (diabetes mellitus) type II controlled with renal manifestation ([place]) 10/31/2015  . Nausea and vomiting 10/31/2015  . Malignant neoplasm of prostate ([place]) 10/24/2015  . Right leg pain 09/04/2015  . Chest pain with high risk for cardiac etiology 08/09/2015  . Acute encephalopathy 08/09/2015  . S/P right total knee replacement 08/09/2015  . Primary osteoarthritis of right knee 08/05/2015  . Neuropathy due to secondary diabetes mellitus ([place]) 08/05/2015  . Arthritis of knee 08/02/2015  . Headache 06/08/2015  . Melena 04/19/2015  . Acute blood loss anemia 04/18/2015  . Hyperthyroidism 03/11/2015  . Thigh pain  12/08/2014  . Nodule of right lung 09/14/2014  . PSA elevation 08/30/2014  . ESRD on hemodialysis ([place]) 08/16/2014  . Essential hypertension 08/16/2014  . Pre-transplant evaluation for kidney transplant 08/16/2014  . Allergic rhinitis 02/24/2014  . Elevated PSA 02/24/2014  . Vertigo 10/14/2013  . Cough 06/26/2013  . Numbness 05/15/2013  . Chronic systolic heart failure ([place]) 02/09/2013  . Other complications due  to renal dialysis device, implant, and graft 01/14/2013  . Right hand pain 03/28/2012  . Dysphagia 03/28/2012  . Hypogonadism, male 03/28/2012  . OSA (obstructive sleep apnea) 10/16/2011  . Hyperlipidemia 10/16/2011  . CAD (coronary artery disease) 09/28/2011  . NSTEMI (non-ST elevated myocardial infarction) ([place]) 09/28/2011  . AVM (arteriovenous malformation) 09/27/2011  . Anemia associated with acute blood loss 09/26/2011  . Anemia 06/16/2011  . Ischemic cardiomyopathy 06/16/2011  . Hyperparathyroidism, secondary ([place]) 06/16/2011  . Preventative health care 03/11/2011  . NECK PAIN 07/31/2010  . GYNECOMASTIA 07/17/2010  . DIZZINESS 07/17/2010  . GAIT DISTURBANCE 03/03/2010  . Thyrotoxicosis 02/02/2010  . Elevated levels of transaminase & lactic acid dehydrogenase 02/01/2010  . Other malaise and fatigue 11/24/2009  . Depression 10/14/2009  . BENIGN PROSTATIC HYPERTROPHY 10/14/2009  . COLONIC POLYPS, HX OF 10/14/2009  . Dementia 09/02/2009  . PULMONARY NODULE, RIGHT LOWER LOBE 06/08/2009  . Chronic diastolic congestive heart failure ([place]) 02/06/2009  . DYSPNEA 10/29/2008  . FOOT PAIN 08/12/2008  . ONYCHOMYCOSIS, TOENAILS 12/26/2007  . GOITER, MULTINODULAR 12/26/2007  . Gout 06/18/2007  . Neuropathy ([place]) 06/18/2007  . Hypertensive heart disease with CHF (congestive heart failure) ([place]) 06/18/2007  . Gastro-esophageal reflux disease without esophagitis 06/18/2007    Scot Jun, PTA 03/04/2017, 3:11 PM  [place], [place], [address] Phone: [phone]   Fax:  [phone]  Name: [name] MRN: [id number] Date of Birth: [birth year]

## 2017-03-05 DIAGNOSIS — N186 End stage renal disease: Secondary | ICD-10-CM | POA: Diagnosis not present

## 2017-03-05 DIAGNOSIS — D631 Anemia in chronic kidney disease: Secondary | ICD-10-CM | POA: Diagnosis not present

## 2017-03-05 DIAGNOSIS — N2581 Secondary hyperparathyroidism of renal origin: Secondary | ICD-10-CM | POA: Diagnosis not present

## 2017-03-05 DIAGNOSIS — D509 Iron deficiency anemia, unspecified: Secondary | ICD-10-CM | POA: Diagnosis not present

## 2017-03-05 DIAGNOSIS — Z23 Encounter for immunization: Secondary | ICD-10-CM | POA: Diagnosis not present

## 2017-03-05 DIAGNOSIS — E119 Type 2 diabetes mellitus without complications: Secondary | ICD-10-CM | POA: Diagnosis not present

## 2017-03-06 ENCOUNTER — Ambulatory Visit: Payer: Self-pay

## 2017-03-06 DIAGNOSIS — N186 End stage renal disease: Secondary | ICD-10-CM | POA: Diagnosis not present

## 2017-03-06 DIAGNOSIS — I871 Compression of vein: Secondary | ICD-10-CM | POA: Diagnosis not present

## 2017-03-06 DIAGNOSIS — Z992 Dependence on renal dialysis: Secondary | ICD-10-CM | POA: Diagnosis not present

## 2017-03-06 DIAGNOSIS — T82858A Stenosis of vascular prosthetic devices, implants and grafts, initial encounter: Secondary | ICD-10-CM | POA: Diagnosis not present

## 2017-03-06 NOTE — Addendum Note (Signed)
Encounter addended by: Jewel Baize, RD on: 03/06/2017 11:32 AM<BR>    Actions taken: Flowsheet data copied forward, Visit Navigator Flowsheet section accepted

## 2017-03-07 DIAGNOSIS — N2581 Secondary hyperparathyroidism of renal origin: Secondary | ICD-10-CM | POA: Diagnosis not present

## 2017-03-07 DIAGNOSIS — D509 Iron deficiency anemia, unspecified: Secondary | ICD-10-CM | POA: Diagnosis not present

## 2017-03-07 DIAGNOSIS — Z23 Encounter for immunization: Secondary | ICD-10-CM | POA: Diagnosis not present

## 2017-03-07 DIAGNOSIS — D631 Anemia in chronic kidney disease: Secondary | ICD-10-CM | POA: Diagnosis not present

## 2017-03-07 DIAGNOSIS — N186 End stage renal disease: Secondary | ICD-10-CM | POA: Diagnosis not present

## 2017-03-07 DIAGNOSIS — E119 Type 2 diabetes mellitus without complications: Secondary | ICD-10-CM | POA: Diagnosis not present

## 2017-03-08 DIAGNOSIS — Z86010 Personal history of colonic polyps: Secondary | ICD-10-CM | POA: Diagnosis not present

## 2017-03-08 DIAGNOSIS — K921 Melena: Secondary | ICD-10-CM | POA: Diagnosis not present

## 2017-03-08 DIAGNOSIS — R42 Dizziness and giddiness: Secondary | ICD-10-CM | POA: Diagnosis not present

## 2017-03-09 DIAGNOSIS — Z23 Encounter for immunization: Secondary | ICD-10-CM | POA: Diagnosis not present

## 2017-03-09 DIAGNOSIS — E119 Type 2 diabetes mellitus without complications: Secondary | ICD-10-CM | POA: Diagnosis not present

## 2017-03-09 DIAGNOSIS — N2581 Secondary hyperparathyroidism of renal origin: Secondary | ICD-10-CM | POA: Diagnosis not present

## 2017-03-09 DIAGNOSIS — D509 Iron deficiency anemia, unspecified: Secondary | ICD-10-CM | POA: Diagnosis not present

## 2017-03-09 DIAGNOSIS — D631 Anemia in chronic kidney disease: Secondary | ICD-10-CM | POA: Diagnosis not present

## 2017-03-09 DIAGNOSIS — N186 End stage renal disease: Secondary | ICD-10-CM | POA: Diagnosis not present

## 2017-03-11 ENCOUNTER — Encounter: Payer: Self-pay | Admitting: Physical Therapy

## 2017-03-11 ENCOUNTER — Ambulatory Visit: Payer: Medicare Other | Admitting: Physical Therapy

## 2017-03-11 DIAGNOSIS — M6281 Muscle weakness (generalized): Secondary | ICD-10-CM | POA: Diagnosis not present

## 2017-03-11 DIAGNOSIS — R296 Repeated falls: Secondary | ICD-10-CM | POA: Diagnosis not present

## 2017-03-11 DIAGNOSIS — R262 Difficulty in walking, not elsewhere classified: Secondary | ICD-10-CM

## 2017-03-11 NOTE — Therapy (Signed)
[place], [place], [address] Phone: [phone]   Fax:  [phone]  Physical Therapy Treatment  Patient Details  Name: [name] MRN: [id number] Date of Birth: [birth year] Referring Provider: Ellouise Newer  Encounter Date: 03/11/2017      PT End of Session - 03/11/17 1555    Visit Number 3   Date for PT Re-Evaluation 04/29/17   PT Start Time 4970   PT Stop Time 1559   PT Time Calculation (min) 44 min   Activity Tolerance Patient tolerated treatment well   Behavior During Therapy [facility] for tasks assessed/performed      Past Medical History:  Diagnosis Date  . Allergic rhinitis, cause unspecified 02/24/2014  . Anemia 06/16/2011  . BENIGN PROSTATIC HYPERTROPHY 10/14/2009  . CAD, NATIVE VESSEL 02/06/2009   saw Dr. Missy Sabins last jan 2013  . Cervical radiculopathy, chronic 02/23/2016   Right c5-6 by NCS/EMG  . CHEST PAIN 03/29/2010  . COLONIC POLYPS, HX OF 10/14/2009  . CONGESTIVE HEART FAILURE 06/18/2007  . Dementia [id number]  . DEPRESSION 10/14/2009  . Depression 09/24/2015  . DIABETES MELLITUS, TYPE II 02/01/2010  . DIASTOLIC HEART FAILURE, CHRONIC 02/06/2009  . DIZZINESS 07/17/2010  . DYSLIPIDEMIA 06/18/2007  . DYSPNEA 10/29/2008  . ESRD (end stage renal disease) on dialysis ([place]) 08/04/2010   "TTS;  " (04/18/2015)  . FOOT PAIN 08/12/2008  . GAIT DISTURBANCE 03/03/2010  . GASTROENTERITIS, VIRAL 10/14/2009  . GERD 06/18/2007  . GOITER, MULTINODULAR 12/26/2007  . GOUT 06/18/2007  . GYNECOMASTIA 07/17/2010  . Hemodialysis access, fistula mature [facility])    Dialysis T-Th-Sa ([place]) Right upper arm fistula  . Hyperlipidemia 10/16/2011  . Hyperparathyroidism, secondary ([place]) 06/16/2011  . HYPERTENSION 06/18/2007  . Hyperthyroidism   . HYPERTHYROIDISM 02/02/2010  . Hypocalcemia 06/07/2010  . Ischemic cardiomyopathy 06/16/2011  . NECK PAIN 07/31/2010  . NSTEMI (non-ST elevated myocardial  infarction) ([place]) 08/04/2016  . ONYCHOMYCOSIS, TOENAILS 12/26/2007  . OSA on CPAP 10/16/2011  . Other malaise and fatigue 11/24/2009  . PERIPHERAL NEUROPATHY 06/18/2007  . Prostate cancer ([place])   . PULMONARY NODULE, RIGHT LOWER LOBE 06/08/2009  . Renal insufficiency   . Sleep apnea    cpap machine and o2  . TRANSAMINASES, SERUM, ELEVATED 02/01/2010  . Transfusion history    none recent  . Unspecified hypotension 01/30/2010    Past Surgical History:  Procedure Laterality Date  . ARTERIOVENOUS GRAFT PLACEMENT Right 2009   forearm/notes 02/01/2011  . AV FISTULA PLACEMENT  11/07/2011   Procedure: INSERTION OF ARTERIOVENOUS (AV) GORE-TEX GRAFT ARM;  Surgeon: Tinnie Gens, MD;  Location: [place];  Service: Vascular;  Laterality: Left;  . BACK SURGERY  1998  . BASCILIC VEIN TRANSPOSITION Right 02/27/2013   Procedure: BASCILIC VEIN TRANSPOSITION;  Surgeon: Mal Misty, MD;  Location: [place];  Service: Vascular;  Laterality: Right;  Right Basilic Vein Transposition   . CARDIAC CATHETERIZATION N/A 08/06/2016   Procedure: Left Heart Cath and Coronary Angiography;  Surgeon: Jolaine Artist, MD;  Location: [place] CV LAB;  Service: Cardiovascular;  Laterality: N/A;  . CARDIAC CATHETERIZATION N/A 08/07/2016   Procedure: Coronary/Graft Atherectomy-CSI LAD;  Surgeon: Peter M Martinique, MD;  Location: [place] CV LAB;  Service: Cardiovascular;  Laterality: N/A;  . CERVICAL SPINE SURGERY  2/09   "to repair nerve problems in my left arm"  . CHOLECYSTECTOMY    . CORONARY ANGIOPLASTY WITH STENT PLACEMENT  06/11/2008  .  CORONARY ANGIOPLASTY WITH STENT PLACEMENT  06/2007   TAXUS stent to RCA/notes 01/31/2011  . ESOPHAGOGASTRODUODENOSCOPY  09/28/2011   Procedure: ESOPHAGOGASTRODUODENOSCOPY (EGD);  Surgeon: Missy Sabins, MD;  Location: [facility] ENDOSCOPY;  Service: Endoscopy;  Laterality: N/A;  . ESOPHAGOGASTRODUODENOSCOPY N/A 04/07/2015   Procedure: ESOPHAGOGASTRODUODENOSCOPY (EGD);  Surgeon: Teena Irani, MD;  Location: [facility]  ENDOS[facility];  Service: Endoscopy;  Laterality: N/A;  . ESOPHAGOGASTRODUODENOSCOPY N/A 04/19/2015   Procedure: ESOPHAGOGASTRODUODENOSCOPY (EGD);  Surgeon: Arta Silence, MD;  Location: [facility] ENDOSCOPY;  Service: Endoscopy;  Laterality: N/A;  . FOREIGN BODY REMOVAL  09/2003   via upper endoscopy/notes 02/12/2011  . GIVENS CAPSULE STUDY  09/30/2011   Procedure: GIVENS CAPSULE STUDY;  Surgeon: Jeryl [place], MD;  Location[facility] ENDOSCOPY;  Service: Endoscopy;  Laterality: N/A;  . INSERTION OF DIALYSIS CATHETER Right 2014  . INSERTION OF DIALYSIS CATHETER Left 02/11/2013   Procedure: INSERTION OF DIALYSIS CATHETER;  Surgeon: Conrad [place], MD;  Location: [place];  Service: Vascular;  Laterality: Left;  Ultrasound guided  . REMOVAL OF A DIALYSIS CATHETER Right 02/11/2013   Procedure: REMOVAL OF A DIALYSIS CATHETER;  Surgeon: Conrad [place], MD;  Location: [place];  Service: Vascular;  Laterality: Right;  . SAVORY DILATION N/A 04/07/2015   Procedure: SAVORY DILATION;  Surgeon: Teena Irani, MD;  [facility] ENDOSCOPY;  Service: Endoscopy;  Laterality: N/A;  . SHUNTOGRAM N/A 09/20/2011   Procedure: Earney Mallet;  Surgeon: Conrad [place], MD;  Location: [facility] CATH LAB;  Service: Cardiovascular;  Laterality: N/A;  . SVT ABLATION N/A 11/26/2016   Procedure: SVT Ablation;  Surgeon: Will Meredith Leeds, MD;  Location: [place] CV LAB;  Service: Cardiovascular;  Laterality: N/A;  . TONSILLECTOMY    . TOTAL KNEE ARTHROPLASTY Right 08/02/2015   Procedure: TOTAL KNEE ARTHROPLASTY;  Surgeon: Renette Butters, MD;  Location: [place];  Service: Orthopedics;  Laterality: Right;  . VENOGRAM N/A 01/26/2013   Procedure: VENOGRAM;  Surgeon: Angelia Mould, MD;  Location: [facility] CATH LAB;  Service: Cardiovascular;  Laterality: N/A;    There were no vitals filed for this visit.      Subjective Assessment - 03/11/17 1520    Subjective Pt reports that he is doing all right. Reports no issues after last session   Currently in Pain? No/denies                          [facility] Adult PT Treatment/Exercise - 03/11/17 0001      Knee/Hip Exercises: Aerobic   Nustep level 3 x 6 minutes   Other Aerobic UBE level 4 3 frd/3rev     Knee/Hip Exercises: Machines for Strengthening   Cybex Knee Extension 10lb x10; SL 5lb x10    Cybex Knee Flexion 25lb 2x15   Cybex Leg Press 20lb 2x10     Knee/Hip Exercises: Standing   Forward Step Up Both;1 set;5 reps;Hand Hold: 2;Step Height: 4"                  PT Short Term Goals - 02/27/17 1424      PT SHORT TERM GOAL #1   Title indepednent with HEP   Time 2   Period Weeks   Status New           PT Long Term Goals - 03/11/17 1557      PT LONG TERM GOAL #1   Title decrease TUG time to 18 seconds   Status On-going  PT LONG TERM GOAL #2   Title increase Berg balance test score to 36/56   Status On-going     PT LONG TERM GOAL #3   Title walk 500 feet without rest   Status On-going     PT LONG TERM GOAL #4   Title increase LE strength to 4/5   Status On-going               Plan - 03/11/17 1555    Clinical Impression Statement Pt able to complete all of today's interventions. Does reports that he does not like to do much with his L knee. Pt demos some weakness with step ups, pt would lock knee in ext and pull himself up with UE. Pt reports this was due to L knee pain.    Rehab Potential Fair   PT Frequency 2x / week   PT Duration 8 weeks   PT Treatment/Interventions ADLs/Self Care Home Management;Gait training;Stair training;Functional mobility training;Patient/family education;Neuromuscular re-education;Balance training;Therapeutic exercise;Therapeutic activities;Manual techniques   PT Next Visit Plan add exercises for strength and balance      Patient will benefit from skilled therapeutic intervention in order to improve the following deficits and impairments:  Abnormal gait, Cardiopulmonary status limiting activity, Decreased activity  tolerance, Decreased balance, Decreased mobility, Decreased strength, Difficulty walking  Visit Diagnosis: Muscle weakness (generalized)  Difficulty in walking, not elsewhere classified  Repeated falls     Problem List Patient Active Problem List   Diagnosis Date Noted  . Weakness 12/27/2016  . Generalized weakness 12/27/2016  . SVT (supraventricular tachycardia) ([place]) 10/16/2016  . Abdominal pain 08/27/2016  . Abnormal findings on diagnostic imaging of liver and biliary tract 08/27/2016  . Bloating symptom 08/27/2016  . Hematochezia 08/27/2016  . Early satiety 08/27/2016  . Eructation 08/27/2016  . Esophageal ulcer 08/27/2016  . Flatulence, eructation and gas pain 08/27/2016  . General weakness 08/27/2016  . Skin sensation disturbance 08/27/2016  . Stricture of esophagus 08/27/2016  . Alternating constipation and diarrhea 08/16/2016  . Status post coronary artery stent placement   . Abnormal EKG   . ST segment depression 08/05/2016  . PAD (peripheral artery disease) ([place]) 06/18/2016  . Transient hypotension 04/29/2016  . Cervical radiculopathy, chronic 02/23/2016  . Memory loss 12/22/2015  . Left-sided weakness 12/22/2015  . Peripheral polyneuropathy 12/22/2015  . DM (diabetes mellitus) type II controlled with renal manifestation ([place]) 10/31/2015  . Nausea and vomiting 10/31/2015  . Malignant neoplasm of prostate ([place]) 10/24/2015  . Right leg pain 09/04/2015  . Chest pain with high risk for cardiac etiology 08/09/2015  . Acute encephalopathy 08/09/2015  . S/P right total knee replacement 08/09/2015  . Primary osteoarthritis of right knee 08/05/2015  . Neuropathy due to secondary diabetes mellitus ([place]) 08/05/2015  . Arthritis of knee 08/02/2015  . Headache 06/08/2015  . Melena 04/19/2015  . Acute blood loss anemia 04/18/2015  . Hyperthyroidism 03/11/2015  . Thigh pain 12/08/2014  . Nodule of right lung 09/14/2014  . PSA elevation 08/30/2014  . ESRD on  hemodialysis ([name]) 08/16/2014  . Essential hypertension 08/16/2014  . Pre-transplant evaluation for kidney transplant 08/16/2014  . Allergic rhinitis 02/24/2014  . Elevated PSA 02/24/2014  . Vertigo 10/14/2013  . Cough 06/26/2013  . Numbness 05/15/2013  . Chronic systolic heart failure ([place]) 02/09/2013  . Other complications due to renal dialysis device, implant, and graft 01/14/2013  . Right hand pain 03/28/2012  . Dysphagia 03/28/2012  . Hypogonadism, male 03/28/2012  . OSA (obstructive sleep apnea)  10/16/2011  . Hyperlipidemia 10/16/2011  . CAD (coronary artery disease) 09/28/2011  . NSTEMI (non-ST elevated myocardial infarction) ([place]) 09/28/2011  . AVM (arteriovenous malformation) 09/27/2011  . Anemia associated with acute blood loss 09/26/2011  . Anemia 06/16/2011  . Ischemic cardiomyopathy 06/16/2011  . Hyperparathyroidism, secondary ([place]) 06/16/2011  . Preventative health care 03/11/2011  . NECK PAIN 07/31/2010  . GYNECOMASTIA 07/17/2010  . DIZZINESS 07/17/2010  . GAIT DISTURBANCE 03/03/2010  . Thyrotoxicosis 02/02/2010  . Elevated levels of transaminase & lactic acid dehydrogenase 02/01/2010  . Other malaise and fatigue 11/24/2009  . Depression 10/14/2009  . BENIGN PROSTATIC HYPERTROPHY 10/14/2009  . COLONIC POLYPS, HX OF 10/14/2009  . Dementia 09/02/2009  . PULMONARY NODULE, RIGHT LOWER LOBE 06/08/2009  . Chronic diastolic congestive heart failure ([place]) 02/06/2009  . DYSPNEA 10/29/2008  . FOOT PAIN 08/12/2008  . ONYCHOMYCOSIS, TOENAILS 12/26/2007  . GOITER, MULTINODULAR 12/26/2007  . Gout 06/18/2007  . Neuropathy ([place]) 06/18/2007  . Hypertensive heart disease with CHF (congestive heart failure) ([place]) 06/18/2007  . Gastro-esophageal reflux disease without esophagitis 06/18/2007    Scot Jun, PTA 03/11/2017, 3:58 PM  [place], [place], [address] Phone: [phone]    Fax:  [phone]  Name: [name] MRN: [id number] Date of Birth: [birth year]

## 2017-03-12 DIAGNOSIS — D509 Iron deficiency anemia, unspecified: Secondary | ICD-10-CM | POA: Diagnosis not present

## 2017-03-12 DIAGNOSIS — Z23 Encounter for immunization: Secondary | ICD-10-CM | POA: Diagnosis not present

## 2017-03-12 DIAGNOSIS — N2581 Secondary hyperparathyroidism of renal origin: Secondary | ICD-10-CM | POA: Diagnosis not present

## 2017-03-12 DIAGNOSIS — N186 End stage renal disease: Secondary | ICD-10-CM | POA: Diagnosis not present

## 2017-03-12 DIAGNOSIS — D631 Anemia in chronic kidney disease: Secondary | ICD-10-CM | POA: Diagnosis not present

## 2017-03-12 DIAGNOSIS — E119 Type 2 diabetes mellitus without complications: Secondary | ICD-10-CM | POA: Diagnosis not present

## 2017-03-13 ENCOUNTER — Ambulatory Visit: Payer: Medicare Other | Admitting: Rehabilitation

## 2017-03-13 ENCOUNTER — Encounter: Payer: Self-pay | Admitting: Rehabilitation

## 2017-03-13 DIAGNOSIS — R296 Repeated falls: Secondary | ICD-10-CM | POA: Diagnosis not present

## 2017-03-13 DIAGNOSIS — R262 Difficulty in walking, not elsewhere classified: Secondary | ICD-10-CM

## 2017-03-13 DIAGNOSIS — M6281 Muscle weakness (generalized): Secondary | ICD-10-CM

## 2017-03-13 NOTE — Therapy (Signed)
[place], [place], [address] Phone: [phone]   Fax:  [phone]  Physical Therapy Treatment  Patient Details  Name: [name] MRN: [id number] Date of Birth: [birth year] Referring Provider: Ellouise Newer  Encounter Date: 03/13/2017      PT End of Session - 03/13/17 1612    Visit Number 4   Date for PT Re-Evaluation 04/29/17   PT Start Time 1530   PT Stop Time 1612   PT Time Calculation (min) 42 min   Activity Tolerance Patient tolerated treatment well      Past Medical History:  Diagnosis Date  . Allergic rhinitis, cause unspecified 02/24/2014  . Anemia 06/16/2011  . BENIGN PROSTATIC HYPERTROPHY 10/14/2009  . CAD, NATIVE VESSEL 02/06/2009   saw Dr. Missy Sabins last jan 2013  . Cervical radiculopathy, chronic 02/23/2016   Right c5-6 by NCS/EMG  . CHEST PAIN 03/29/2010  . COLONIC POLYPS, HX OF 10/14/2009  . CONGESTIVE HEART FAILURE 06/18/2007  . Dementia [id number]  . DEPRESSION 10/14/2009  . Depression 09/24/2015  . DIABETES MELLITUS, TYPE II 02/01/2010  . DIASTOLIC HEART FAILURE, CHRONIC 02/06/2009  . DIZZINESS 07/17/2010  . DYSLIPIDEMIA 06/18/2007  . DYSPNEA 10/29/2008  . ESRD (end stage renal disease) on dialysis ([place]) 08/04/2010   "TTS;  " (04/18/2015)  . FOOT PAIN 08/12/2008  . GAIT DISTURBANCE 03/03/2010  . GASTROENTERITIS, VIRAL 10/14/2009  . GERD 06/18/2007  . GOITER, MULTINODULAR 12/26/2007  . GOUT 06/18/2007  . GYNECOMASTIA 07/17/2010  . Hemodialysis access, fistula mature [facility])    Dialysis T-Th-Sa ([place]) Right upper arm fistula  . Hyperlipidemia 10/16/2011  . Hyperparathyroidism, secondary ([place]) 06/16/2011  . HYPERTENSION 06/18/2007  . Hyperthyroidism   . HYPERTHYROIDISM 02/02/2010  . Hypocalcemia 06/07/2010  . Ischemic cardiomyopathy 06/16/2011  . NECK PAIN 07/31/2010  . NSTEMI (non-ST elevated myocardial infarction) ([place]) 08/04/2016  . ONYCHOMYCOSIS, TOENAILS  12/26/2007  . OSA on CPAP 10/16/2011  . Other malaise and fatigue 11/24/2009  . PERIPHERAL NEUROPATHY 06/18/2007  . Prostate cancer ([place])   . PULMONARY NODULE, RIGHT LOWER LOBE 06/08/2009  . Renal insufficiency   . Sleep apnea    cpap machine and o2  . TRANSAMINASES, SERUM, ELEVATED 02/01/2010  . Transfusion history    none recent  . Unspecified hypotension 01/30/2010    Past Surgical History:  Procedure Laterality Date  . ARTERIOVENOUS GRAFT PLACEMENT Right 2009   forearm/notes 02/01/2011  . AV FISTULA PLACEMENT  11/07/2011   Procedure: INSERTION OF ARTERIOVENOUS (AV) GORE-TEX GRAFT ARM;  Surgeon: Tinnie Gens, MD;  Location: [place];  Service: Vascular;  Laterality: Left;  . BACK SURGERY  1998  . BASCILIC VEIN TRANSPOSITION Right 02/27/2013   Procedure: BASCILIC VEIN TRANSPOSITION;  Surgeon: Mal Misty, MD;  Location: [place];  Service: Vascular;  Laterality: Right;  Right Basilic Vein Transposition   . CARDIAC CATHETERIZATION N/A 08/06/2016   Procedure: Left Heart Cath and Coronary Angiography;  Surgeon: Jolaine Artist, MD;  Location: [place] CV LAB;  Service: Cardiovascular;  Laterality: N/A;  . CARDIAC CATHETERIZATION N/A 08/07/2016   Procedure: Coronary/Graft Atherectomy-CSI LAD;  Surgeon: Peter M Martinique, MD;  Location: [place] CV LAB;  Service: Cardiovascular;  Laterality: N/A;  . CERVICAL SPINE SURGERY  2/09   "to repair nerve problems in my left arm"  . CHOLECYSTECTOMY    . CORONARY ANGIOPLASTY WITH STENT PLACEMENT  06/11/2008  . CORONARY ANGIOPLASTY WITH STENT PLACEMENT  06/2007  TAXUS stent to RCA/notes 01/31/2011  . ESOPHAGOGASTRODUODENOSCOPY  09/28/2011   Procedure: ESOPHAGOGASTRODUODENOSCOPY (EGD);  Surgeon: Missy Sabins, MD;  Location: [facility] ENDOSCOPY;  Service: Endoscopy;  Laterality: N/A;  . ESOPHAGOGASTRODUODENOSCOPY N/A 04/07/2015   Procedure: ESOPHAGOGASTRODUODENOSCOPY (EGD);  Surgeon: Teena Irani, MD;  Location: [facility];  Service: Endoscopy;  Laterality: N/A;  .  ESOPHAGOGASTRODUODENOSCOPY N/A 04/19/2015   Procedure: ESOPHAGOGASTRODUODENOSCOPY (EGD);  Surgeon: Arta Silence, MD;  Location: [facility] ENDOSCOPY;  Service: Endoscopy;  Laterality: N/A;  . FOREIGN BODY REMOVAL  09/2003   via upper endoscopy/notes 02/12/2011  . GIVENS CAPSULE STUDY  09/30/2011   Procedure: GIVENS CAPSULE STUDY;  Surgeon: Jeryl Columbia, MD;  Location: [facility] ENDOSCOPY;  Service: Endoscopy;  Laterality: N/A;  . INSERTION OF DIALYSIS CATHETER Right 2014  . INSERTION OF DIALYSIS CATHETER Left 02/11/2013   Procedure: INSERTION OF DIALYSIS CATHETER;  Surgeon: Conrad Litchville, MD;  Location: [place];  Service: Vascular;  Laterality: Left;  Ultrasound guided  . REMOVAL OF A DIALYSIS CATHETER Right 02/11/2013   Procedure: REMOVAL OF A DIALYSIS CATHETER;  Surgeon: Conrad Newald, MD;  Location: [place];  Service: Vascular;  Laterality: Right;  . SAVORY DILATION N/A 04/07/2015   Procedure: SAVORY DILATION;  Surgeon: Teena Irani, MD;  Location: [facility] ENDOSCOPY;  Service: Endoscopy;  Laterality: N/A;  . SHUNTOGRAM N/A 09/20/2011   Procedure: Earney Mallet;  Surgeon: Conrad Griggs, MD;  Location[facility] CATH LAB;  Service: Cardiovascular;  Laterality: N/A;  . SVT ABLATION N/A 11/26/2016   Procedure: SVT Ablation;  Surgeon: Will Meredith Leeds, MD;  Location: [place] CV LAB;  Service: Cardiovascular;  Laterality: N/A;  . TONSILLECTOMY    . TOTAL KNEE ARTHROPLASTY Right 08/02/2015   Procedure: TOTAL KNEE ARTHROPLASTY;  Surgeon: Renette Butters, MD;  Location: [place];  Service: Orthopedics;  Laterality: Right;  . VENOGRAM N/A 01/26/2013   Procedure: VENOGRAM;  Surgeon: Angelia Mould, MD;  Location: [facility] CATH LAB;  Service: Cardiovascular;  Laterality: N/A;    There were no vitals filed for this visit.      Subjective Assessment - 03/13/17 1530    Subjective Nothing to report.  Doing well   Currently in Pain? No/denies                         [facility] Adult PT Treatment/Exercise - 03/13/17 0001       Ambulation/Gait   Ambulation Distance (Feet) 80 Feet  CGA rest x 1 with RW follow     Knee/Hip Exercises: Aerobic   Nustep level 5x73minutes   Other Aerobic UBE level 4 3 frd/3rev     Knee/Hip Exercises: Machines for Strengthening   Cybex Knee Extension 15# x 10, 5# single leg   Cybex Knee Flexion 25lb 2x15   Cybex Leg Press 20lb 2x10     Knee/Hip Exercises: Standing   Forward Step Up Both;1 set;5 reps;Hand Hold: 2;Step Height: 4"  minA for L     Knee/Hip Exercises: Seated   Sit to Sand 5 reps  without hands and with work on standing upright                  PT Short Term Goals - 02/27/17 1424      PT SHORT TERM GOAL #1   Title indepednent with HEP   Time 2   Period Weeks   Status New           PT Long Term Goals - 03/11/17 1557  PT LONG TERM GOAL #1   Title decrease TUG time to 18 seconds   Status On-going     PT LONG TERM GOAL #2   Title increase Berg balance test score to 36/56   Status On-going     PT LONG TERM GOAL #3   Title walk 500 feet without rest   Status On-going     PT LONG TERM GOAL #4   Title increase LE strength to 4/5   Status On-going               Plan - 03/13/17 1612    Clinical Impression Statement performed all TE but limited by fatigue in general.  Gait without RW with intermittent L LE buckling at the knee and fatigue.  Difficulty with step ups on the L due to bad knee   PT Frequency 2x / week   PT Duration 8 weeks   PT Treatment/Interventions ADLs/Self Care Home Management;Gait training;Stair training;Functional mobility training;Patient/family education;Neuromuscular re-education;Balance training;Therapeutic exercise;Therapeutic activities;Manual techniques   PT Next Visit Plan add exercises for strength and balance   Consulted and Agree with Plan of Care Patient      Patient will benefit from skilled therapeutic intervention in order to improve the following deficits and impairments:  Abnormal gait,  Cardiopulmonary status limiting activity, Decreased activity tolerance, Decreased balance, Decreased mobility, Decreased strength, Difficulty walking  Visit Diagnosis: Muscle weakness (generalized)  Difficulty in walking, not elsewhere classified  Repeated falls     Problem List Patient Active Problem List   Diagnosis Date Noted  . Weakness 12/27/2016  . Generalized weakness 12/27/2016  . SVT (supraventricular tachycardia) ([place]) 10/16/2016  . Abdominal pain 08/27/2016  . Abnormal findings on diagnostic imaging of liver and biliary tract 08/27/2016  . Bloating symptom 08/27/2016  . Hematochezia 08/27/2016  . Early satiety 08/27/2016  . Eructation 08/27/2016  . Esophageal ulcer 08/27/2016  . Flatulence, eructation and gas pain 08/27/2016  . General weakness 08/27/2016  . Skin sensation disturbance 08/27/2016  . Stricture of esophagus 08/27/2016  . Alternating constipation and diarrhea 08/16/2016  . Status post coronary artery stent placement   . Abnormal EKG   . ST segment depression 08/05/2016  . PAD (peripheral artery disease) ([place]) 06/18/2016  . Transient hypotension 04/29/2016  . Cervical radiculopathy, chronic 02/23/2016  . Memory loss 12/22/2015  . Left-sided weakness 12/22/2015  . Peripheral polyneuropathy 12/22/2015  . DM (diabetes mellitus) type II controlled with renal manifestation ([place]) 10/31/2015  . Nausea and vomiting 10/31/2015  . Malignant neoplasm of prostate ([place]) 10/24/2015  . Right leg pain 09/04/2015  . Chest pain with high risk for cardiac etiology 08/09/2015  . Acute encephalopathy 08/09/2015  . S/P right total knee replacement 08/09/2015  . Primary osteoarthritis of right knee 08/05/2015  . Neuropathy due to secondary diabetes mellitus ([place]) 08/05/2015  . Arthritis of knee 08/02/2015  . Headache 06/08/2015  . Melena 04/19/2015  . Acute blood loss anemia 04/18/2015  . Hyperthyroidism 03/11/2015  . Thigh pain 12/08/2014  . Nodule of right lung  09/14/2014  . PSA elevation 08/30/2014  . ESRD on hemodialysis ([place]) 08/16/2014  . Essential hypertension 08/16/2014  . Pre-transplant evaluation for kidney transplant 08/16/2014  . Allergic rhinitis 02/24/2014  . Elevated PSA 02/24/2014  . Vertigo 10/14/2013  . Cough 06/26/2013  . Numbness 05/15/2013  . Chronic systolic heart failure ([place]) 02/09/2013  . Other complications due to renal dialysis device, implant, and graft 01/14/2013  . Right hand pain 03/28/2012  . Dysphagia 03/28/2012  .  Hypogonadism, male 03/28/2012  . OSA (obstructive sleep apnea) 10/16/2011  . Hyperlipidemia 10/16/2011  . CAD (coronary artery disease) 09/28/2011  . NSTEMI (non-ST elevated myocardial infarction) ([place]) 09/28/2011  . AVM (arteriovenous malformation) 09/27/2011  . Anemia associated with acute blood loss 09/26/2011  . Anemia 06/16/2011  . Ischemic cardiomyopathy 06/16/2011  . Hyperparathyroidism, secondary ([place]) 06/16/2011  . Preventative health care 03/11/2011  . NECK PAIN 07/31/2010  . GYNECOMASTIA 07/17/2010  . DIZZINESS 07/17/2010  . GAIT DISTURBANCE 03/03/2010  . Thyrotoxicosis 02/02/2010  . Elevated levels of transaminase & lactic acid dehydrogenase 02/01/2010  . Other malaise and fatigue 11/24/2009  . Depression 10/14/2009  . BENIGN PROSTATIC HYPERTROPHY 10/14/2009  . COLONIC POLYPS, HX OF 10/14/2009  . Dementia 09/02/2009  . PULMONARY NODULE, RIGHT LOWER LOBE 06/08/2009  . Chronic diastolic congestive heart failure ([place]) 02/06/2009  . DYSPNEA 10/29/2008  . FOOT PAIN 08/12/2008  . ONYCHOMYCOSIS, TOENAILS 12/26/2007  . GOITER, MULTINODULAR 12/26/2007  . Gout 06/18/2007  . Neuropathy ([place]) 06/18/2007  . Hypertensive heart disease with CHF (congestive heart failure) ([place]) 06/18/2007  . Gastro-esophageal reflux disease without esophagitis 06/18/2007    Stark Bray, DPT, CMP 03/13/2017, 4:14 PM  [facility] [employer] [place], [place], [address] Phone: [phone]   Fax:  [phone]  Name: [name] MRN: [id number] Date of Birth: 03/30/1951

## 2017-03-14 DIAGNOSIS — N186 End stage renal disease: Secondary | ICD-10-CM | POA: Diagnosis not present

## 2017-03-14 DIAGNOSIS — Z23 Encounter for immunization: Secondary | ICD-10-CM | POA: Diagnosis not present

## 2017-03-14 DIAGNOSIS — N2581 Secondary hyperparathyroidism of renal origin: Secondary | ICD-10-CM | POA: Diagnosis not present

## 2017-03-14 DIAGNOSIS — D509 Iron deficiency anemia, unspecified: Secondary | ICD-10-CM | POA: Diagnosis not present

## 2017-03-14 DIAGNOSIS — E119 Type 2 diabetes mellitus without complications: Secondary | ICD-10-CM | POA: Diagnosis not present

## 2017-03-14 DIAGNOSIS — D631 Anemia in chronic kidney disease: Secondary | ICD-10-CM | POA: Diagnosis not present

## 2017-03-16 DIAGNOSIS — D509 Iron deficiency anemia, unspecified: Secondary | ICD-10-CM | POA: Diagnosis not present

## 2017-03-16 DIAGNOSIS — N2581 Secondary hyperparathyroidism of renal origin: Secondary | ICD-10-CM | POA: Diagnosis not present

## 2017-03-16 DIAGNOSIS — E119 Type 2 diabetes mellitus without complications: Secondary | ICD-10-CM | POA: Diagnosis not present

## 2017-03-16 DIAGNOSIS — D631 Anemia in chronic kidney disease: Secondary | ICD-10-CM | POA: Diagnosis not present

## 2017-03-16 DIAGNOSIS — Z23 Encounter for immunization: Secondary | ICD-10-CM | POA: Diagnosis not present

## 2017-03-16 DIAGNOSIS — N186 End stage renal disease: Secondary | ICD-10-CM | POA: Diagnosis not present

## 2017-03-18 ENCOUNTER — Encounter: Payer: Self-pay | Admitting: Physical Therapy

## 2017-03-18 ENCOUNTER — Ambulatory Visit: Payer: Medicare Other | Admitting: Physical Therapy

## 2017-03-18 DIAGNOSIS — M6281 Muscle weakness (generalized): Secondary | ICD-10-CM | POA: Diagnosis not present

## 2017-03-18 DIAGNOSIS — R262 Difficulty in walking, not elsewhere classified: Secondary | ICD-10-CM

## 2017-03-18 DIAGNOSIS — R296 Repeated falls: Secondary | ICD-10-CM | POA: Diagnosis not present

## 2017-03-18 DIAGNOSIS — M19031 Primary osteoarthritis, right wrist: Secondary | ICD-10-CM | POA: Diagnosis not present

## 2017-03-18 DIAGNOSIS — M11231 Other chondrocalcinosis, right wrist: Secondary | ICD-10-CM | POA: Diagnosis not present

## 2017-03-18 NOTE — Therapy (Signed)
[place], [place], [address] Phone: [phone]   Fax:  [phone]  Physical Therapy Treatment  Patient Details  Name: [name] MRN: [id number] Date of Birth: [birth year] Referring Provider: Ellouise Newer  Encounter Date: 03/18/2017      PT End of Session - 03/18/17 1642    Visit Number 5   Date for PT Re-Evaluation 04/29/17   PT Start Time 1600   PT Stop Time 1642   PT Time Calculation (min) 42 min   Activity Tolerance Patient tolerated treatment well   Behavior During Therapy [facility] for tasks assessed/performed      Past Medical History:  Diagnosis Date  . Allergic rhinitis, cause unspecified 02/24/2014  . Anemia 06/16/2011  . BENIGN PROSTATIC HYPERTROPHY 10/14/2009  . CAD, NATIVE VESSEL 02/06/2009   saw Dr. Missy Sabins last jan 2013  . Cervical radiculopathy, chronic 02/23/2016   Right c5-6 by NCS/EMG  . CHEST PAIN 03/29/2010  . COLONIC POLYPS, HX OF 10/14/2009  . CONGESTIVE HEART FAILURE 06/18/2007  . Dementia [id number]  . DEPRESSION 10/14/2009  . Depression 09/24/2015  . DIABETES MELLITUS, TYPE II 02/01/2010  . DIASTOLIC HEART FAILURE, CHRONIC 02/06/2009  . DIZZINESS 07/17/2010  . DYSLIPIDEMIA 06/18/2007  . DYSPNEA 10/29/2008  . ESRD (end stage renal disease) on dialysis ([place]) 08/04/2010   "TTS;  " (04/18/2015)  . FOOT PAIN 08/12/2008  . GAIT DISTURBANCE 03/03/2010  . GASTROENTERITIS, VIRAL 10/14/2009  . GERD 06/18/2007  . GOITER, MULTINODULAR 12/26/2007  . GOUT 06/18/2007  . GYNECOMASTIA 07/17/2010  . Hemodialysis access, fistula mature [facility])    Dialysis T-Th-Sa ([place]) Right upper arm fistula  . Hyperlipidemia 10/16/2011  . Hyperparathyroidism, secondary ([place]) 06/16/2011  . HYPERTENSION 06/18/2007  . Hyperthyroidism   . HYPERTHYROIDISM 02/02/2010  . Hypocalcemia 06/07/2010  . Ischemic cardiomyopathy 06/16/2011  . NECK PAIN 07/31/2010  . NSTEMI (non-ST elevated myocardial  infarction) ([place]) 08/04/2016  . ONYCHOMYCOSIS, TOENAILS 12/26/2007  . OSA on CPAP 10/16/2011  . Other malaise and fatigue 11/24/2009  . PERIPHERAL NEUROPATHY 06/18/2007  . Prostate cancer ([place])   . PULMONARY NODULE, RIGHT LOWER LOBE 06/08/2009  . Renal insufficiency   . Sleep apnea    cpap machine and o2  . TRANSAMINASES, SERUM, ELEVATED 02/01/2010  . Transfusion history    none recent  . Unspecified hypotension 01/30/2010    Past Surgical History:  Procedure Laterality Date  . ARTERIOVENOUS GRAFT PLACEMENT Right 2009   forearm/notes 02/01/2011  . AV FISTULA PLACEMENT  11/07/2011   Procedure: INSERTION OF ARTERIOVENOUS (AV) GORE-TEX GRAFT ARM;  Surgeon: Tinnie Gens, MD;  Location: [place];  Service: Vascular;  Laterality: Left;  . BACK SURGERY  1998  . BASCILIC VEIN TRANSPOSITION Right 02/27/2013   Procedure: BASCILIC VEIN TRANSPOSITION;  Surgeon: Mal Misty, MD;  Location: [place];  Service: Vascular;  Laterality: Right;  Right Basilic Vein Transposition   . CARDIAC CATHETERIZATION N/A 08/06/2016   Procedure: Left Heart Cath and Coronary Angiography;  Surgeon: Jolaine Artist, MD;  Location: [place] CV LAB;  Service: Cardiovascular;  Laterality: N/A;  . CARDIAC CATHETERIZATION N/A 08/07/2016   Procedure: Coronary/Graft Atherectomy-CSI LAD;  Surgeon: Peter M Martinique, MD;  Location: [place] CV LAB;  Service: Cardiovascular;  Laterality: N/A;  . CERVICAL SPINE SURGERY  2/09   "to repair nerve problems in my left arm"  . CHOLECYSTECTOMY    . CORONARY ANGIOPLASTY WITH STENT PLACEMENT  06/11/2008  .  CORONARY ANGIOPLASTY WITH STENT PLACEMENT  06/2007   TAXUS stent to RCA/notes 01/31/2011  . ESOPHAGOGASTRODUODENOSCOPY  09/28/2011   Procedure: ESOPHAGOGASTRODUODENOSCOPY (EGD);  Surgeon: Missy Sabins, MD;  Location: [facility] ENDOSCOPY;  Service: Endoscopy;  Laterality: N/A;  . ESOPHAGOGASTRODUODENOSCOPY N/A 04/07/2015   Procedure: ESOPHAGOGASTRODUODENOSCOPY (EGD);  Surgeon: Teena Irani, MD;  Location: [facility]  ENDOSCOPY;  Service: Endoscopy;  Laterality: N/A;  . ESOPHAGOGASTRODUODENOSCOPY N/A 04/19/2015   Procedure: ESOPHAGOGASTRODUODENOSCOPY (EGD);  Surgeon: Arta Silence, MD;  Location: [facility] ENDOSCOPY;  Service: Endoscopy;  Laterality: N/A;  . FOREIGN BODY REMOVAL  09/2003   via upper endoscopy/notes 02/12/2011  . GIVENS CAPSULE STUDY  09/30/2011   Procedure: GIVENS CAPSULE STUDY;  Surgeon: Jeryl Columbia, MD;  Location: [facility] ENDOSCOPY;  Service: Endoscopy;  Laterality: N/A;  . INSERTION OF DIALYSIS CATHETER Right 2014  . INSERTION OF DIALYSIS CATHETER Left 02/11/2013   Procedure: INSERTION OF DIALYSIS CATHETER;  Surgeon: Conrad Wedgefield, MD;  Location: [place];  Service: Vascular;  Laterality: Left;  Ultrasound guided  . REMOVAL OF A DIALYSIS CATHETER Right 02/11/2013   Procedure: REMOVAL OF A DIALYSIS CATHETER;  Surgeon: Conrad Owasa, MD;  Location: [place];  Service: Vascular;  Laterality: Right;  . SAVORY DILATION N/A 04/07/2015   Procedure: SAVORY DILATION;  Surgeon: Teena Irani, MD;  Location: [facility] ENDOSCOPY;  Service: Endoscopy;  Laterality: N/A;  . SHUNTOGRAM N/A 09/20/2011   Procedure: Earney Mallet;  Surgeon: Conrad Dalton, MD;  Location: [facility] CATH LAB;  Service: Cardiovascular;  Laterality: N/A;  . SVT ABLATION N/A 11/26/2016   Procedure: SVT Ablation;  Surgeon: Will Meredith Leeds, MD;  Location: [place] CV LAB;  Service: Cardiovascular;  Laterality: N/A;  . TONSILLECTOMY    . TOTAL KNEE ARTHROPLASTY Right 08/02/2015   Procedure: TOTAL KNEE ARTHROPLASTY;  Surgeon: Renette Butters, MD;  Location: [place];  Service: Orthopedics;  Laterality: Right;  . VENOGRAM N/A 01/26/2013   Procedure: VENOGRAM;  Surgeon: Angelia Mould, MD;  Location: [facility] CATH LAB;  Service: Cardiovascular;  Laterality: N/A;    There were no vitals filed for this visit.      Subjective Assessment - 03/18/17 1601    Subjective "All right so far"   Currently in Pain? No/denies            [facility] PT Assessment - 03/18/17 0001       Timed Up and Go Test   TUG Normal TUG   Normal TUG (seconds) 13.18  Rollator                     [facility] Adult PT Treatment/Exercise - 03/18/17 0001      Lumbar Exercises: Standing   Row 10 reps;Theraband;Both  x2   Theraband Level (Row) Level 4 (Blue)     Knee/Hip Exercises: Aerobic   Nustep level 5x42minutes   Other Aerobic UBE level 4 3 frd/3rev     Knee/Hip Exercises: Machines for Strengthening   Cybex Knee Extension 15# x 10, 5# single leg   Cybex Knee Flexion 25lb 2x15   Cybex Leg Press 30lb 2x10     Knee/Hip Exercises: Standing   Other Standing Knee Exercises Standing march 2 x5 each      Knee/Hip Exercises: Seated   Sit to Sand 5 reps;without UE support;2 sets  from airex                   PT Short Term Goals - 02/27/17 1424      PT  SHORT TERM GOAL #1   Title indepednent with HEP   Time 2   Period Weeks   Status New           PT Long Term Goals - 03/18/17 1642      PT LONG TERM GOAL #1   Title decrease TUG time to 18 seconds   Status Achieved     PT LONG TERM GOAL #3   Title walk 500 feet without rest   Status On-going               Plan - 03/18/17 1643    Clinical Impression Statement Pt has progressed meeting some LTG's. Performed all interventions but limited by fatigue. No reports of increase pain only tired and winded.   Rehab Potential Fair   PT Frequency 2x / week   PT Duration 8 weeks   PT Treatment/Interventions ADLs/Self Care Home Management;Gait training;Stair training;Functional mobility training;Patient/family education;Neuromuscular re-education;Balance training;Therapeutic exercise;Therapeutic activities;Manual techniques   PT Next Visit Plan add exercises for strength and balance      Patient will benefit from skilled therapeutic intervention in order to improve the following deficits and impairments:  Abnormal gait, Cardiopulmonary status limiting activity, Decreased activity tolerance,  Decreased balance, Decreased mobility, Decreased strength, Difficulty walking  Visit Diagnosis: Difficulty in walking, not elsewhere classified  Repeated falls  Muscle weakness (generalized)     Problem List Patient Active Problem List   Diagnosis Date Noted  . Weakness 12/27/2016  . Generalized weakness 12/27/2016  . SVT (supraventricular tachycardia) ([place]) 10/16/2016  . Abdominal pain 08/27/2016  . Abnormal findings on diagnostic imaging of liver and biliary tract 08/27/2016  . Bloating symptom 08/27/2016  . Hematochezia 08/27/2016  . Early satiety 08/27/2016  . Eructation 08/27/2016  . Esophageal ulcer 08/27/2016  . Flatulence, eructation and gas pain 08/27/2016  . General weakness 08/27/2016  . Skin sensation disturbance 08/27/2016  . Stricture of esophagus 08/27/2016  . Alternating constipation and diarrhea 08/16/2016  . Status post coronary artery stent placement   . Abnormal EKG   . ST segment depression 08/05/2016  . PAD (peripheral artery disease) ([place]) 06/18/2016  . Transient hypotension 04/29/2016  . Cervical radiculopathy, chronic 02/23/2016  . Memory loss 12/22/2015  . Left-sided weakness 12/22/2015  . Peripheral polyneuropathy 12/22/2015  . DM (diabetes mellitus) type II controlled with renal manifestation ([place]) 10/31/2015  . Nausea and vomiting 10/31/2015  . Malignant neoplasm of prostate ([place]) 10/24/2015  . Right leg pain 09/04/2015  . Chest pain with high risk for cardiac etiology 08/09/2015  . Acute encephalopathy 08/09/2015  . S/P right total knee replacement 08/09/2015  . Primary osteoarthritis of right knee 08/05/2015  . Neuropathy due to secondary diabetes mellitus ([place]) 08/05/2015  . Arthritis of knee 08/02/2015  . Headache 06/08/2015  . Melena 04/19/2015  . Acute blood loss anemia 04/18/2015  . Hyperthyroidism 03/11/2015  . Thigh pain 12/08/2014  . Nodule of right lung 09/14/2014  . PSA elevation 08/30/2014  . ESRD on hemodialysis ([place])  08/16/2014  . Essential hypertension 08/16/2014  . Pre-transplant evaluation for kidney transplant 08/16/2014  . Allergic rhinitis 02/24/2014  . Elevated PSA 02/24/2014  . Vertigo 10/14/2013  . Cough 06/26/2013  . Numbness 05/15/2013  . Chronic systolic heart failure ([place]) 02/09/2013  . Other complications due to renal dialysis device, implant, and graft 01/14/2013  . Right hand pain 03/28/2012  . Dysphagia 03/28/2012  . Hypogonadism, male 03/28/2012  . OSA (obstructive sleep apnea) 10/16/2011  . Hyperlipidemia 10/16/2011  .  CAD (coronary artery disease) 09/28/2011  . NSTEMI (non-ST elevated myocardial infarction) ([place]) 09/28/2011  . AVM (arteriovenous malformation) 09/27/2011  . Anemia associated with acute blood loss 09/26/2011  . Anemia 06/16/2011  . Ischemic cardiomyopathy 06/16/2011  . Hyperparathyroidism, secondary ([place]) 06/16/2011  . Preventative health care 03/11/2011  . NECK PAIN 07/31/2010  . GYNECOMASTIA 07/17/2010  . DIZZINESS 07/17/2010  . GAIT DISTURBANCE 03/03/2010  . Thyrotoxicosis 02/02/2010  . Elevated levels of transaminase & lactic acid dehydrogenase 02/01/2010  . Other malaise and fatigue 11/24/2009  . Depression 10/14/2009  . BENIGN PROSTATIC HYPERTROPHY 10/14/2009  . COLONIC POLYPS, HX OF 10/14/2009  . Dementia 09/02/2009  . PULMONARY NODULE, RIGHT LOWER LOBE 06/08/2009  . Chronic diastolic congestive heart failure ([place]) 02/06/2009  . DYSPNEA 10/29/2008  . FOOT PAIN 08/12/2008  . ONYCHOMYCOSIS, TOENAILS 12/26/2007  . GOITER, MULTINODULAR 12/26/2007  . Gout 06/18/2007  . Neuropathy ([place]) 06/18/2007  . Hypertensive heart disease with CHF (congestive heart failure) ([place]) 06/18/2007  . Gastro-esophageal reflux disease without esophagitis 06/18/2007    Scot Jun, PTA 03/18/2017, 4:44 PM  [place], [place], [address] Phone: [phone]   Fax:   [phone]  Name: [name] MRN: [id number] Date of Birth: Dec 27, 1950

## 2017-03-19 DIAGNOSIS — D509 Iron deficiency anemia, unspecified: Secondary | ICD-10-CM | POA: Diagnosis not present

## 2017-03-19 DIAGNOSIS — N2581 Secondary hyperparathyroidism of renal origin: Secondary | ICD-10-CM | POA: Diagnosis not present

## 2017-03-19 DIAGNOSIS — E119 Type 2 diabetes mellitus without complications: Secondary | ICD-10-CM | POA: Diagnosis not present

## 2017-03-19 DIAGNOSIS — D631 Anemia in chronic kidney disease: Secondary | ICD-10-CM | POA: Diagnosis not present

## 2017-03-19 DIAGNOSIS — N186 End stage renal disease: Secondary | ICD-10-CM | POA: Diagnosis not present

## 2017-03-19 DIAGNOSIS — Z23 Encounter for immunization: Secondary | ICD-10-CM | POA: Diagnosis not present

## 2017-03-20 ENCOUNTER — Encounter: Payer: Self-pay | Admitting: Rehabilitation

## 2017-03-20 ENCOUNTER — Ambulatory Visit: Payer: Medicare Other | Admitting: Rehabilitation

## 2017-03-20 DIAGNOSIS — R262 Difficulty in walking, not elsewhere classified: Secondary | ICD-10-CM | POA: Diagnosis not present

## 2017-03-20 DIAGNOSIS — M6281 Muscle weakness (generalized): Secondary | ICD-10-CM

## 2017-03-20 DIAGNOSIS — R296 Repeated falls: Secondary | ICD-10-CM | POA: Diagnosis not present

## 2017-03-20 NOTE — Therapy (Signed)
[place], [place], [address] Phone: [phone]   Fax:  [phone]  Physical Therapy Treatment  Patient Details  Name: [name] MRN: [id number] Date of Birth: 09-07-1951 Referring Provider: Ellouise Newer  Encounter Date: 03/20/2017      PT End of Session - 03/20/17 1341    Visit Number 6   Date for PT Re-Evaluation 04/29/17   PT Start Time 1315   PT Stop Time 1357   PT Time Calculation (min) 42 min   Activity Tolerance Patient tolerated treatment well      Past Medical History:  Diagnosis Date  . Allergic rhinitis, cause unspecified 02/24/2014  . Anemia 06/16/2011  . BENIGN PROSTATIC HYPERTROPHY 10/14/2009  . CAD, NATIVE VESSEL 02/06/2009   saw Dr. Missy Sabins last jan 2013  . Cervical radiculopathy, chronic 02/23/2016   Right c5-6 by NCS/EMG  . CHEST PAIN 03/29/2010  . COLONIC POLYPS, HX OF 10/14/2009  . CONGESTIVE HEART FAILURE 06/18/2007  . Dementia [id number]  . DEPRESSION 10/14/2009  . Depression 09/24/2015  . DIABETES MELLITUS, TYPE II 02/01/2010  . DIASTOLIC HEART FAILURE, CHRONIC 02/06/2009  . DIZZINESS 07/17/2010  . DYSLIPIDEMIA 06/18/2007  . DYSPNEA 10/29/2008  . ESRD (end stage renal disease) on dialysis ([place]) 08/04/2010   "TTS;  " (04/18/2015)  . FOOT PAIN 08/12/2008  . GAIT DISTURBANCE 03/03/2010  . GASTROENTERITIS, VIRAL 10/14/2009  . GERD 06/18/2007  . GOITER, MULTINODULAR 12/26/2007  . GOUT 06/18/2007  . GYNECOMASTIA 07/17/2010  . Hemodialysis access, fistula mature [facility])    Dialysis [facility] ([place]) Right upper arm fistula  . Hyperlipidemia 10/16/2011  . Hyperparathyroidism, secondary ([place]) 06/16/2011  . HYPERTENSION 06/18/2007  . Hyperthyroidism   . HYPERTHYROIDISM 02/02/2010  . Hypocalcemia 06/07/2010  . Ischemic cardiomyopathy 06/16/2011  . NECK PAIN 07/31/2010  . NSTEMI (non-ST elevated myocardial infarction) ([place]) 08/04/2016  . ONYCHOMYCOSIS, TOENAILS  12/26/2007  . OSA on CPAP 10/16/2011  . Other malaise and fatigue 11/24/2009  . PERIPHERAL NEUROPATHY 06/18/2007  . Prostate cancer ([place])   . PULMONARY NODULE, RIGHT LOWER LOBE 06/08/2009  . Renal insufficiency   . Sleep apnea    cpap machine and o2  . TRANSAMINASES, SERUM, ELEVATED 02/01/2010  . Transfusion history    none recent  . Unspecified hypotension 01/30/2010    Past Surgical History:  Procedure Laterality Date  . ARTERIOVENOUS GRAFT PLACEMENT Right 2009   forearm/notes 02/01/2011  . AV FISTULA PLACEMENT  11/07/2011   Procedure: INSERTION OF ARTERIOVENOUS (AV) GORE-TEX GRAFT ARM;  Surgeon: Tinnie Gens, MD;  Location: [place];  Service: Vascular;  Laterality: Left;  . BACK SURGERY  1998  . BASCILIC VEIN TRANSPOSITION Right 02/27/2013   Procedure: BASCILIC VEIN TRANSPOSITION;  Surgeon: Mal Misty, MD;  Location: [facility];  Service: Vascular;  Laterality: Right;  Right Basilic Vein Transposition   . CARDIAC CATHETERIZATION N/A 08/06/2016   Procedure: Left Heart Cath and Coronary Angiography;  Surgeon: Jolaine Artist, MD;  Location: [place] CV LAB;  Service: Cardiovascular;  Laterality: N/A;  . CARDIAC CATHETERIZATION N/A 08/07/2016   Procedure: Coronary/Graft Atherectomy-CSI LAD;  Surgeon: Peter M Martinique, MD;  Location: [place] CV LAB;  Service: Cardiovascular;  Laterality: N/A;  . CERVICAL SPINE SURGERY  2/09   "to repair nerve problems in my left arm"  . CHOLECYSTECTOMY    . CORONARY ANGIOPLASTY WITH STENT PLACEMENT  06/11/2008  . CORONARY ANGIOPLASTY WITH STENT PLACEMENT  06/2007  TAXUS stent to RCA/notes 01/31/2011  . ESOPHAGOGASTRODUODENOSCOPY  09/28/2011   Procedure: ESOPHAGOGASTRODUODENOSCOPY (EGD);  Surgeon: Missy Sabins, MD;  Location: [facility] ENDOSCOPY;  Service: Endoscopy;  Laterality: N/A;  . ESOPHAGOGASTRODUODENOSCOPY N/A 04/07/2015   Procedure: ESOPHAGOGASTRODUODENOSCOPY (EGD);  Surgeon: Teena Irani, MD;  Location: [facility];  Service: Endoscopy;  Laterality: N/A;  .  ESOPHAGOGASTRODUODENOSCOPY N/A 04/19/2015   Procedure: ESOPHAGOGASTRODUODENOSCOPY (EGD);  Surgeon: Arta Silence, MD;  Location: [facility] ENDOSCOPY;  Service: Endoscopy;  Laterality: N/A;  . FOREIGN BODY REMOVAL  09/2003   via upper endoscopy/notes 02/12/2011  . GIVENS CAPSULE STUDY  09/30/2011   Procedure: GIVENS CAPSULE STUDY;  Surgeon: Jeryl [place], MD;  Location: [facility] ENDOSCOPY;  Service: Endoscopy;  Laterality: N/A;  . INSERTION OF DIALYSIS CATHETER Right 2014  . INSERTION OF DIALYSIS CATHETER Left 02/11/2013   Procedure: INSERTION OF DIALYSIS CATHETER;  Surgeon: Conrad [place], MD;  Location: [place];  Service: Vascular;  Laterality: Left;  Ultrasound guided  . REMOVAL OF A DIALYSIS CATHETER Right 02/11/2013   Procedure: REMOVAL OF A DIALYSIS CATHETER;  Surgeon: Conrad [place], MD;  Location: [place];  Service: Vascular;  Laterality: Right;  . SAVORY DILATION N/A 04/07/2015   Procedure: SAVORY DILATION;  Surgeon: Teena Irani, MD;  Location: [facility] ENDOSCOPY;  Service: Endoscopy;  Laterality: N/A;  . SHUNTOGRAM N/A 09/20/2011   Procedure: Earney Mallet;  Surgeon: Conrad [place], MD;  Location: [facility] CATH LAB;  Service: Cardiovascular;  Laterality: N/A;  . SVT ABLATION N/A 11/26/2016   Procedure: SVT Ablation;  Surgeon: Will Meredith Leeds, MD;  Location: [place] CV LAB;  Service: Cardiovascular;  Laterality: N/A;  . TONSILLECTOMY    . TOTAL KNEE ARTHROPLASTY Right 08/02/2015   Procedure: TOTAL KNEE ARTHROPLASTY;  Surgeon: Renette Butters, MD;  Location: [place];  Service: Orthopedics;  Laterality: Right;  . VENOGRAM N/A 01/26/2013   Procedure: VENOGRAM;  Surgeon: Angelia Mould, MD;  Location: [facility] CATH LAB;  Service: Cardiovascular;  Laterality: N/A;    There were no vitals filed for this visit.      Subjective Assessment - 03/20/17 1315    Subjective nothing new   Currently in Pain? No/denies                         [facility] Adult PT Treatment/Exercise - 03/20/17 0001      Knee/Hip  Exercises: Aerobic   Nustep level 5x68minutes   Other Aerobic UBE level 4 4 frd/4rev     Knee/Hip Exercises: Machines for Strengthening   Cybex Knee Extension 15# x 10, 5# single leg   Cybex Knee Flexion 25lb 2x15   Cybex Leg Press 50lb 2x10  (accidental increase)     Knee/Hip Exercises: Standing   Other Standing Knee Exercises Standing march 2 x5 each   at [employer]     Knee/Hip Exercises: Seated   Ball Squeeze 10"x10   Sit to [employer] --  3 reps on airex no UE                  PT Short Term Goals - 02/27/17 1424      PT SHORT TERM GOAL #1   Title indepednent with HEP   Time 2   Period Weeks   Status New           PT Long Term Goals - 03/18/17 1642      PT LONG TERM GOAL #1   Title decrease TUG time to 18 seconds  Status Achieved     PT LONG TERM GOAL #3   Title walk 500 feet without rest   Status On-going               Plan - 03/20/17 1341    Clinical Impression Statement Improved endurance activities today and tolerated all TE without out as many rest breaks/fatigue.     PT Frequency 2x / week   PT Duration 8 weeks   PT Next Visit Plan add exercises for strength and balance      Patient will benefit from skilled therapeutic intervention in order to improve the following deficits and impairments:     Visit Diagnosis: Difficulty in walking, not elsewhere classified  Repeated falls  Muscle weakness (generalized)     Problem List Patient Active Problem List   Diagnosis Date Noted  . Weakness 12/27/2016  . Generalized weakness 12/27/2016  . SVT (supraventricular tachycardia) ([place]) 10/16/2016  . Abdominal pain 08/27/2016  . Abnormal findings on diagnostic imaging of liver and biliary tract 08/27/2016  . Bloating symptom 08/27/2016  . Hematochezia 08/27/2016  . Early satiety 08/27/2016  . Eructation 08/27/2016  . Esophageal ulcer 08/27/2016  . Flatulence, eructation and gas pain 08/27/2016  . General weakness 08/27/2016  .  Skin sensation disturbance 08/27/2016  . Stricture of esophagus 08/27/2016  . Alternating constipation and diarrhea 08/16/2016  . Status post coronary artery stent placement   . Abnormal EKG   . ST segment depression 08/05/2016  . PAD (peripheral artery disease) ([place]) 06/18/2016  . Transient hypotension 04/29/2016  . Cervical radiculopathy, chronic 02/23/2016  . Memory loss 12/22/2015  . Left-sided weakness 12/22/2015  . Peripheral polyneuropathy 12/22/2015  . DM (diabetes mellitus) type II controlled with renal manifestation ([place]) 10/31/2015  . Nausea and vomiting 10/31/2015  . Malignant neoplasm of prostate ([place]) 10/24/2015  . Right leg pain 09/04/2015  . Chest pain with high risk for cardiac etiology 08/09/2015  . Acute encephalopathy 08/09/2015  . S/P right total knee replacement 08/09/2015  . Primary osteoarthritis of right knee 08/05/2015  . Neuropathy due to secondary diabetes mellitus ([place]) 08/05/2015  . Arthritis of knee 08/02/2015  . Headache 06/08/2015  . Melena 04/19/2015  . Acute blood loss anemia 04/18/2015  . Hyperthyroidism 03/11/2015  . Thigh pain 12/08/2014  . Nodule of right lung 09/14/2014  . PSA elevation 08/30/2014  . ESRD on hemodialysis ([place]) 08/16/2014  . Essential hypertension 08/16/2014  . Pre-transplant evaluation for kidney transplant 08/16/2014  . Allergic rhinitis 02/24/2014  . Elevated PSA 02/24/2014  . Vertigo 10/14/2013  . Cough 06/26/2013  . Numbness 05/15/2013  . Chronic systolic heart failure ([place]) 02/09/2013  . Other complications due to renal dialysis device, implant, and graft 01/14/2013  . Right hand pain 03/28/2012  . Dysphagia 03/28/2012  . Hypogonadism, male 03/28/2012  . OSA (obstructive sleep apnea) 10/16/2011  . Hyperlipidemia 10/16/2011  . CAD (coronary artery disease) 09/28/2011  . NSTEMI (non-ST elevated myocardial infarction) ([place]) 09/28/2011  . AVM (arteriovenous malformation) 09/27/2011  . Anemia associated with acute  blood loss 09/26/2011  . Anemia 06/16/2011  . Ischemic cardiomyopathy 06/16/2011  . Hyperparathyroidism, secondary ([place]) 06/16/2011  . Preventative health care 03/11/2011  . NECK PAIN 07/31/2010  . GYNECOMASTIA 07/17/2010  . DIZZINESS 07/17/2010  . GAIT DISTURBANCE 03/03/2010  . Thyrotoxicosis 02/02/2010  . Elevated levels of transaminase & lactic acid dehydrogenase 02/01/2010  . Other malaise and fatigue 11/24/2009  . Depression 10/14/2009  . BENIGN PROSTATIC HYPERTROPHY 10/14/2009  . COLONIC POLYPS,  HX OF 10/14/2009  . Dementia 09/02/2009  . PULMONARY NODULE, RIGHT LOWER LOBE 06/08/2009  . Chronic diastolic congestive heart failure ([place]) 02/06/2009  . DYSPNEA 10/29/2008  . FOOT PAIN 08/12/2008  . ONYCHOMYCOSIS, TOENAILS 12/26/2007  . GOITER, MULTINODULAR 12/26/2007  . Gout 06/18/2007  . Neuropathy ([place]) 06/18/2007  . Hypertensive heart disease with CHF (congestive heart failure) ([place]) 06/18/2007  . Gastro-esophageal reflux disease without esophagitis 06/18/2007    Stark Bray, DPT, CMP 03/20/2017, 1:58 PM  [place] [place] [place], [place], [address] Phone: [phone]   Fax:  [phone]  Name: [name] MRN: [id number] Date of Birth: April 05, 1951

## 2017-03-21 DIAGNOSIS — D631 Anemia in chronic kidney disease: Secondary | ICD-10-CM | POA: Diagnosis not present

## 2017-03-21 DIAGNOSIS — E119 Type 2 diabetes mellitus without complications: Secondary | ICD-10-CM | POA: Diagnosis not present

## 2017-03-21 DIAGNOSIS — Z23 Encounter for immunization: Secondary | ICD-10-CM | POA: Diagnosis not present

## 2017-03-21 DIAGNOSIS — N186 End stage renal disease: Secondary | ICD-10-CM | POA: Diagnosis not present

## 2017-03-21 DIAGNOSIS — D509 Iron deficiency anemia, unspecified: Secondary | ICD-10-CM | POA: Diagnosis not present

## 2017-03-21 DIAGNOSIS — N2581 Secondary hyperparathyroidism of renal origin: Secondary | ICD-10-CM | POA: Diagnosis not present

## 2017-03-22 ENCOUNTER — Other Ambulatory Visit: Payer: Self-pay

## 2017-03-22 NOTE — Patient Outreach (Signed)
[facility]) Care Management  03/22/17  [name] Mar 16, 1951 [id number]  Successful outreach completed with patient and his wife, [name]. Patient identification verified. Patient stated he continues to be about the same. Denies any problems with his breathing. Stated that he has continued to weigh daily and his wife confirms this, although patient does not weigh at the same time every day due to his schedule. Some days he does not get up as early as others when he has dialysis or appointments. Patient able to verbalize that he has no symptoms of heart failure.   Patient continues to complain of fatigue and pain, particularly following dialysis. He stated that he is not sure why he is tired all the time, but his doctors have told him it could be the dialysis. He is frustrated because he stated that there are other people who come in and complete dialysis and then seem to have energy and goes to work with no difficulty.  Patient currently denies any rectal bleeding, but stated that he does have it off and on at times. His wife stated that his colonoscopy was canceled because he is on Plavix and Dr. [name] did not want him to stop taking it currently. RNCM educated on signs and symptoms to watch for and when to call the doctor.   Currently, they have no questions or concerns, but are agreeable for a home visit to follow up with patient.  Plan: Home visit scheduled in next 2 weeks.  [facility] CM Care Plan Problem One     Most Recent Value  Care Plan Problem One  Patient at risk for fluid volume overload as evidenced by dyspnea related to fluid volume overload during last admission  Role Documenting the Problem One  Care Management [place] for Problem One  Active  [facility] Long Term Goal   Patient will not have a hospital admisson related to heart failure in next 90 days  [facility] Long Term Goal Start Date  02/07/17  Interventions for Problem One Long Term Goal  RNCM  provided education about heart failure, HF zones, ensured he has follow up appointments and is taking medications  [facility] CM Short Term Goal #1   Patient will be able to verbalize the signs and symptoms associated with the yellow heart failure zone within the next 30 days  [facility] CM Short Term Goal #1 Start Date  02/07/17  [name] CM Short Term Goal #1 Met Date  03/22/17  [name] CM Short Term Goal #2   Patient will weigh daily for next 30 days  [facility] CM Short Term Goal #2 Start Date  02/07/17  [facility] CM Short Term Goal #2 Met Date  03/22/17      [place] R. Lanson Randle, RN, BSN, [place] Management Coordinator [phone]

## 2017-03-23 DIAGNOSIS — D509 Iron deficiency anemia, unspecified: Secondary | ICD-10-CM | POA: Diagnosis not present

## 2017-03-23 DIAGNOSIS — D631 Anemia in chronic kidney disease: Secondary | ICD-10-CM | POA: Diagnosis not present

## 2017-03-23 DIAGNOSIS — N186 End stage renal disease: Secondary | ICD-10-CM | POA: Diagnosis not present

## 2017-03-23 DIAGNOSIS — E119 Type 2 diabetes mellitus without complications: Secondary | ICD-10-CM | POA: Diagnosis not present

## 2017-03-23 DIAGNOSIS — N2581 Secondary hyperparathyroidism of renal origin: Secondary | ICD-10-CM | POA: Diagnosis not present

## 2017-03-23 DIAGNOSIS — Z23 Encounter for immunization: Secondary | ICD-10-CM | POA: Diagnosis not present

## 2017-03-25 ENCOUNTER — Ambulatory Visit: Payer: Medicare Other | Admitting: Physical Therapy

## 2017-03-25 ENCOUNTER — Ambulatory Visit (INDEPENDENT_AMBULATORY_CARE_PROVIDER_SITE_OTHER): Payer: Medicare Other | Admitting: Cardiology

## 2017-03-25 ENCOUNTER — Encounter: Payer: Self-pay | Admitting: Physical Therapy

## 2017-03-25 ENCOUNTER — Encounter: Payer: Self-pay | Admitting: Cardiology

## 2017-03-25 VITALS — BP 128/68 | HR 80 | Ht 73.0 in | Wt 238.0 lb

## 2017-03-25 DIAGNOSIS — I428 Other cardiomyopathies: Secondary | ICD-10-CM

## 2017-03-25 DIAGNOSIS — R296 Repeated falls: Secondary | ICD-10-CM | POA: Diagnosis not present

## 2017-03-25 DIAGNOSIS — I2589 Other forms of chronic ischemic heart disease: Secondary | ICD-10-CM

## 2017-03-25 DIAGNOSIS — I471 Supraventricular tachycardia, unspecified: Secondary | ICD-10-CM

## 2017-03-25 DIAGNOSIS — I255 Ischemic cardiomyopathy: Secondary | ICD-10-CM

## 2017-03-25 DIAGNOSIS — I10 Essential (primary) hypertension: Secondary | ICD-10-CM

## 2017-03-25 DIAGNOSIS — M6281 Muscle weakness (generalized): Secondary | ICD-10-CM | POA: Diagnosis not present

## 2017-03-25 DIAGNOSIS — R262 Difficulty in walking, not elsewhere classified: Secondary | ICD-10-CM | POA: Diagnosis not present

## 2017-03-25 NOTE — Patient Instructions (Signed)
Your physician recommends that you continue on your current medications as directed. Please refer to the Current Medication list given to you today.   Your physician wants you to follow-up in: [place] will receive a reminder letter in the mail two months in advance. If you don't receive a letter, please call our office to schedule the follow-up appointment.

## 2017-03-25 NOTE — Therapy (Signed)
[place], [place], [address] Phone: [phone]   Fax:  [phone]  Physical Therapy Treatment  Patient Details  Name: [name] MRN: [id number] Date of Birth: Jan 27, 1951 Referring Provider: Ellouise Newer  Encounter Date: 03/25/2017      PT End of Session - 03/25/17 1423    Date for PT Re-Evaluation 04/29/17   PT Start Time 1345   PT Stop Time 1428   PT Time Calculation (min) 43 min   Activity Tolerance Patient tolerated treatment well   Behavior During Therapy [facility] for tasks assessed/performed      Past Medical History:  Diagnosis Date  . Allergic rhinitis, cause unspecified 02/24/2014  . Anemia 06/16/2011  . BENIGN PROSTATIC HYPERTROPHY 10/14/2009  . CAD, NATIVE VESSEL 02/06/2009   saw Dr. Missy Sabins last jan 2013  . Cervical radiculopathy, chronic 02/23/2016   Right c5-6 by NCS/EMG  . CHEST PAIN 03/29/2010  . COLONIC POLYPS, HX OF 10/14/2009  . CONGESTIVE HEART FAILURE 06/18/2007  . Dementia [id number]  . DEPRESSION 10/14/2009  . Depression 09/24/2015  . DIABETES MELLITUS, TYPE II 02/01/2010  . DIASTOLIC HEART FAILURE, CHRONIC 02/06/2009  . DIZZINESS 07/17/2010  . DYSLIPIDEMIA 06/18/2007  . DYSPNEA 10/29/2008  . ESRD (end stage renal disease) on dialysis ([place]) 08/04/2010   "TTS;  " (04/18/2015)  . FOOT PAIN 08/12/2008  . GAIT DISTURBANCE 03/03/2010  . GASTROENTERITIS, VIRAL 10/14/2009  . GERD 06/18/2007  . GOITER, MULTINODULAR 12/26/2007  . GOUT 06/18/2007  . GYNECOMASTIA 07/17/2010  . Hemodialysis access, fistula [facility])    Dialysis T-Th-Sa ([place]) Right upper arm fistula  . Hyperlipidemia 10/16/2011  . Hyperparathyroidism, secondary ([place]) 06/16/2011  . HYPERTENSION 06/18/2007  . Hyperthyroidism   . HYPERTHYROIDISM 02/02/2010  . Hypocalcemia 06/07/2010  . Ischemic cardiomyopathy 06/16/2011  . NECK PAIN 07/31/2010  . NSTEMI (non-ST elevated myocardial infarction) ([place])  08/04/2016  . ONYCHOMYCOSIS, TOENAILS 12/26/2007  . OSA on CPAP 10/16/2011  . Other malaise and fatigue 11/24/2009  . PERIPHERAL NEUROPATHY 06/18/2007  . Prostate cancer ([place])   . PULMONARY NODULE, RIGHT LOWER LOBE 06/08/2009  . Renal insufficiency   . Sleep apnea    cpap machine and o2  . TRANSAMINASES, SERUM, ELEVATED 02/01/2010  . Transfusion history    none recent  . Unspecified hypotension 01/30/2010    Past Surgical History:  Procedure Laterality Date  . ARTERIOVENOUS GRAFT PLACEMENT Right 2009   forearm/notes 02/01/2011  . AV FISTULA PLACEMENT  11/07/2011   Procedure: INSERTION OF ARTERIOVENOUS (AV) GORE-TEX GRAFT ARM;  Surgeon: Tinnie Gens, MD;  Location: [place];  Service: Vascular;  Laterality: Left;  . BACK SURGERY  1998  . BASCILIC VEIN TRANSPOSITION Right 02/27/2013   Procedure: BASCILIC VEIN TRANSPOSITION;  Surgeon: Mal Misty, MD;  Location: [place];  Service: Vascular;  Laterality: Right;  Right Basilic Vein Transposition   . CARDIAC CATHETERIZATION N/A 08/06/2016   Procedure: Left Heart Cath and Coronary Angiography;  Surgeon: Jolaine Artist, MD;  Location: [place] CV LAB;  Service: Cardiovascular;  Laterality: N/A;  . CARDIAC CATHETERIZATION N/A 08/07/2016   Procedure: Coronary/Graft Atherectomy-CSI LAD;  Surgeon: Peter M Martinique, MD;  Location: [place] CV LAB;  Service: Cardiovascular;  Laterality: N/A;  . CERVICAL SPINE SURGERY  2/09   "to repair nerve problems in my left arm"  . CHOLECYSTECTOMY    . CORONARY ANGIOPLASTY WITH STENT PLACEMENT  06/11/2008  . CORONARY ANGIOPLASTY WITH STENT PLACEMENT  06/2007   TAXUS stent to RCA/notes 01/31/2011  . ESOPHAGOGASTRODUODENOSCOPY  09/28/2011   Procedure: ESOPHAGOGASTRODUODENOSCOPY (EGD);  Surgeon: Missy Sabins, MD;  Location: [facility] ENDOSCOPY;  Service: Endoscopy;  Laterality: N/A;  . ESOPHAGOGASTRODUODENOSCOPY N/A 04/07/2015   Procedure: ESOPHAGOGASTRODUODENOSCOPY (EGD);  Surgeon: Teena Irani, MD;  Location: [facility];   Service: Endoscopy;  Laterality: N/A;  . ESOPHAGOGASTRODUODENOSCOPY N/A 04/19/2015   Procedure: ESOPHAGOGASTRODUODENOSCOPY (EGD);  Surgeon: Arta Silence, MD;  Location: [facility] ENDOSCOPY;  Service: Endoscopy;  Laterality: N/A;  . FOREIGN BODY REMOVAL  09/2003   via upper endoscopy/notes 02/12/2011  . GIVENS CAPSULE STUDY  09/30/2011   Procedure: GIVENS CAPSULE STUDY;  Surgeon: Jeryl [place], MD;  Location: [facility] ENDOSCOPY;  Service: Endoscopy;  Laterality: N/A;  . INSERTION OF DIALYSIS CATHETER Right 2014  . INSERTION OF DIALYSIS CATHETER Left 02/11/2013   Procedure: INSERTION OF DIALYSIS CATHETER;  Surgeon: Conrad Grimes, MD;  Location: [place];  Service: Vascular;  Laterality: Left;  Ultrasound guided  . REMOVAL OF A DIALYSIS CATHETER Right 02/11/2013   Procedure: REMOVAL OF A DIALYSIS CATHETER;  Surgeon: Conrad [place], MD;  Location: [place];  Service: Vascular;  Laterality: Right;  . SAVORY DILATION N/A 04/07/2015   Procedure: SAVORY DILATION;  Surgeon: Teena Irani, MD;  Location: [facility] ENDOSCOPY;  Service: Endoscopy;  Laterality: N/A;  . SHUNTOGRAM N/A 09/20/2011   Procedure: Earney Mallet;  Surgeon: Conrad [place], MD;  Location: [facility] CATH LAB;  Service: Cardiovascular;  Laterality: N/A;  . SVT ABLATION N/A 11/26/2016   Procedure: SVT Ablation;  Surgeon: Will Meredith Leeds, MD;  Location: [place] CV LAB;  Service: Cardiovascular;  Laterality: N/A;  . TONSILLECTOMY    . TOTAL KNEE ARTHROPLASTY Right 08/02/2015   Procedure: TOTAL KNEE ARTHROPLASTY;  Surgeon: Renette Butters, MD;  Location: [place];  Service: Orthopedics;  Laterality: Right;  . VENOGRAM N/A 01/26/2013   Procedure: VENOGRAM;  Surgeon: Angelia Mould, MD;  Location: [facility] C[facility] LAB;  Service: Cardiovascular;  Laterality: N/A;    There were no vitals filed for this visit.      Subjective Assessment - 03/25/17 1348    Subjective "Doing all right"   Currently in Pain? No/denies                         [facility] Adult PT  Treatment/Exercise - 03/25/17 0001      Lumbar Exercises: Standing   Row 10 reps;Theraband;Both   Theraband Level (Row) Level 4 (Blue)     Lumbar Exercises: Seated   Other Seated Lumbar Exercises OHP yellow ball.     Knee/Hip Exercises: Aerobic   Nustep level 5x44minutes   Other Aerobic UBE level 4 4 frd/4rev     Knee/Hip Exercises: Machines for Strengthening   Cybex Knee Extension 15# x 10, 5# single leg   Cybex Knee Flexion 25lb 2x15   Cybex Leg Press 50lb 2x10     Knee/Hip Exercises: Standing   Other Standing Knee Exercises Standing march 2 x10 each   with rollator                  PT Short Term Goals - 02/27/17 1424      PT SHORT TERM GOAL #1   Title indepednent with HEP   Time 2   Period Weeks   Status New           PT Long Term Goals - 03/18/17 1642      PT LONG TERM  GOAL #1   Title decrease TUG time to 18 seconds   Status Achieved     PT LONG TERM GOAL #3   Title walk 500 feet without rest   Status On-going               Plan - 03/25/17 1423    Clinical Impression Statement Pt with increase fatigue today, reports no pain. Does require cues to maintain posture with standing rows and seated OHP   Rehab Potential Fair   PT Frequency 2x / week   PT Duration 8 weeks   PT Treatment/Interventions ADLs/Self Care Home Management;Gait training;Stair training;Functional mobility training;Patient/family education;Neuromuscular re-education;Balance training;Therapeutic exercise;Therapeutic activities;Manual techniques   PT Next Visit Plan BERG, add exercises for strength and balance      Patient will benefit from skilled therapeutic intervention in order to improve the following deficits and impairments:  Abnormal gait, Cardiopulmonary status limiting activity, Decreased activity tolerance, Decreased balance, Decreased mobility, Decreased strength, Difficulty walking  Visit Diagnosis: Difficulty in walking, not elsewhere classified  Repeated  falls  Muscle weakness (generalized)     Problem List Patient Active Problem List   Diagnosis Date Noted  . Weakness 12/27/2016  . Generalized weakness 12/27/2016  . SVT (supraventricular tachycardia) ([place]) 10/16/2016  . Abdominal pain 08/27/2016  . Abnormal findings on diagnostic imaging of liver and biliary tract 08/27/2016  . Bloating symptom 08/27/2016  . Hematochezia 08/27/2016  . Early satiety 08/27/2016  . Eructation 08/27/2016  . Esophageal ulcer 08/27/2016  . Flatulence, eructation and gas pain 08/27/2016  . General weakness 08/27/2016  . Skin sensation disturbance 08/27/2016  . Stricture of esophagus 08/27/2016  . Alternating constipation and diarrhea 08/16/2016  . Status post coronary artery stent placement   . Abnormal EKG   . ST segment depression 08/05/2016  . PAD (peripheral artery disease) ([place]) 06/18/2016  . Transient hypotension 04/29/2016  . Cervical radiculopathy, chronic 02/23/2016  . Memory loss 12/22/2015  . Left-sided weakness 12/22/2015  . Peripheral polyneuropathy 12/22/2015  . DM (diabetes mellitus) type II controlled with renal manifestation ([place]) 10/31/2015  . Nausea and vomiting 10/31/2015  . Malignant neoplasm of prostate ([place]) 10/24/2015  . Right leg pain 09/04/2015  . Chest pain with high risk for cardiac etiology 08/09/2015  . Acute encephalopathy 08/09/2015  . S/P right total knee replacement 08/09/2015  . Primary osteoarthritis of right knee 08/05/2015  . Neuropathy due to secondary diabetes mellitus ([place]) 08/05/2015  . Arthritis of knee 08/02/2015  . Headache 06/08/2015  . Melena 04/19/2015  . Acute blood loss anemia 04/18/2015  . Hyperthyroidism 03/11/2015  . Thigh pain 12/08/2014  . Nodule of right lung 09/14/2014  . PSA elevation 08/30/2014  . ESRD on hemodialysis ([place]) 08/16/2014  . Essential hypertension 08/16/2014  . Pre-transplant evaluation for kidney transplant 08/16/2014  . Allergic rhinitis 02/24/2014  . Elevated  PSA 02/24/2014  . Vertigo 10/14/2013  . Cough 06/26/2013  . Numbness 05/15/2013  . Chronic systolic heart failure ([place]) 02/09/2013  . Other complications due to renal dialysis device, implant, and graft 01/14/2013  . Right hand pain 03/28/2012  . Dysphagia 03/28/2012  . Hypogonadism, male 03/28/2012  . OSA (obstructive sleep apnea) 10/16/2011  . Hyperlipidemia 10/16/2011  . CAD (coronary artery disease) 09/28/2011  . NSTEMI (non-ST elevated myocardial infarction) ([place]) 09/28/2011  . AVM (arteriovenous malformation) 09/27/2011  . Anemia associated with acute blood loss 09/26/2011  . Anemia 06/16/2011  . Ischemic cardiomyopathy 06/16/2011  . Hyperparathyroidism, secondary ([place]) 06/16/2011  . Preventative health  care 03/11/2011  . NECK PAIN 07/31/2010  . GYNECOMASTIA 07/17/2010  . DIZZINESS 07/17/2010  . GAIT DISTURBANCE 03/03/2010  . Thyrotoxicosis 02/02/2010  . Elevated levels of transaminase & lactic acid dehydrogenase 02/01/2010  . Other malaise and fatigue 11/24/2009  . Depression 10/14/2009  . BENIGN PROSTATIC HYPERTROPHY 10/14/2009  . COLONIC POLYPS, HX OF 10/14/2009  . Dementia 09/02/2009  . PULMONARY NODULE, RIGHT LOWER LOBE 06/08/2009  . Chronic diastolic congestive heart failure ([place]) 02/06/2009  . DYSPNEA 10/29/2008  . FOOT PAIN 08/12/2008  . ONYCHOMYCOSIS, TOENAILS 12/26/2007  . GOITER, MULTINODULAR 12/26/2007  . Gout 06/18/2007  . Neuropathy ([place]) 06/18/2007  . Hypertensive heart disease with CHF (congestive heart failure) ([place]) 06/18/2007  . Gastro-esophageal reflux disease without esophagitis 06/18/2007    Scot Jun, PTA 03/25/2017, 2:24 PM  [place], [place], [address] Phone: [phone]   Fax:  [phone]  Name: SURAJ RAMDASS MRN: [id number] Date of Birth: 23-Nov-1950

## 2017-03-25 NOTE — Progress Notes (Signed)
Electrophysiology Office Note   Date:  03/25/2017   ID:  [name], DOB 1951/01/23, MRN [id number]  PCP:  Biagio Borg, MD  Cardiologist:  Gerber Primary Electrophysiologist:  Addalee Kavanagh Meredith Leeds, MD    Chief Complaint  Patient presents with  . Follow-up    SVT     History of Present Illness: [name] is a 66 y.o. male who presents today for electrophysiology evaluation.   He has a history of obesity, diabetes, COPD on nighttime oxygen, sleep apnea on CPAP, ESRD- HD, CAD, S/P Taxus drug-eluting stent to the right coronary in September 1093, chronic systolic/diastolic heart failure (Unable to tolerate statins so placed on Welchol), prostate CA s/p XRT. On 11/17, he underwent an STEMI and underwent PCI to the LAD. The RCA had a long 80-90% lesion but was felt not amenable to PCI due to tortuosity. He was admitted on 1/18 with SVT and an an STEMI. He had another episode of SVT on 11/15/16 and presented to the emergency room. This was complicated by hypotension, lightheadedness and blurry vision. He was undergoing dialysis at the time when his heart rate went up to the 160s. He was given 6 mg of adenosine which converted him to sinus rhythm. His symptoms then resolved. 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    Today, denies symptoms of palpitations, chest pain, shortness of breath, orthopnea, PND, lower extremity edema, claudication, dizziness, presyncope, syncope, bleeding, or neurologic sequela. The patient is tolerating medications without difficulties. He has been feeling well other than fatigue. He says his fatigue is mainly around the time of his dialysis. His been started on Midrin as his blood pressure drops during dialysis.   Past Medical History:  Diagnosis Date  . Allergic rhinitis, cause unspecified 02/24/2014  . Anemia 06/16/2011  . BENIGN PROSTATIC HYPERTROPHY 10/14/2009  . CAD, NATIVE VESSEL 02/06/2009   saw Dr. Missy Sabins last jan 2013  . Cervical radiculopathy,  chronic 02/23/2016   Right c5-6 by NCS/EMG  . CHEST PAIN 03/29/2010  . COLONIC POLYPS, HX OF 10/14/2009  . CONGESTIVE HEART FAILURE 06/18/2007  . Dementia [id number]  . DEPRESSION 10/14/2009  . Depression 09/24/2015  . DIABETES MELLITUS, TYPE II 02/01/2010  . DIASTOLIC HEART FAILURE, CHRONIC 02/06/2009  . DIZZINESS 07/17/2010  . DYSLIPIDEMIA 06/18/2007  . DYSPNEA 10/29/2008  . ESRD (end stage renal disease) on dialysis ([place]) 08/04/2010   "TTS;  " (04/18/2015)  . FOOT PAIN 08/12/2008  . GAIT DISTURBANCE 03/03/2010  . GASTROENTERITIS, VIRAL 10/14/2009  . GERD 06/18/2007  . GOITER, MULTINODULAR 12/26/2007  . GOUT 06/18/2007  . GYNECOMASTIA 07/17/2010  . Hemodialysis access, fistula mature [facility])    Dialysis T-Th-Sa ([place]) Right upper arm fistula  . Hyperlipidemia 10/16/2011  . Hyperparathyroidism, secondary ([place]) 06/16/2011  . HYPERTENSION 06/18/2007  . Hyperthyroidism   . HYPERTHYROIDISM 02/02/2010  . Hypocalcemia 06/07/2010  . Ischemic cardiomyopathy 06/16/2011  . NECK PAIN 07/31/2010  . NSTEMI (non-ST elevated myocardial infarction) ([place]) 08/04/2016  . ONYCHOMYCOSIS, TOENAILS 12/26/2007  . OSA on CPAP 10/16/2011  . Other malaise and fatigue 11/24/2009  . PERIPHERAL NEUROPATHY 06/18/2007  . Prostate cancer ([place])   . PULMONARY NODULE, RIGHT LOWER LOBE 06/08/2009  . Renal insufficiency   . Sleep apnea    cpap machine and o2  . TRANSAMINASES, SERUM, ELEVATED 02/01/2010  . Transfusion history    none recent  . Unspecified hypotension 01/30/2010   Past Surgical History:  Procedure Laterality Date  . ARTERIOVENOUS GRAFT  PLACEMENT Right 2009   forearm/notes 02/01/2011  . AV FISTULA PLACEMENT  11/07/2011   Procedure: INSERTION OF ARTERIOVENOUS (AV) GORE-TEX GRAFT ARM;  Surgeon: Tinnie Gens, MD;  Location: [place];  Service: Vascular;  Laterality: Left;  . BACK SURGERY  1998  . BASCILIC VEIN TRANSPOSITION Right 02/27/2013   Procedure: BASCILIC VEIN TRANSPOSITION;  Surgeon: Mal Misty, MD;   Location: [place];  Service: Vascular;  Laterality: Right;  Right Basilic Vein Transposition   . CARDIAC CATHETERIZATION N/A 08/06/2016   Procedure: Left Heart Cath and Coronary Angiography;  Surgeon: Jolaine Artist, MD;  Location: [place] CV LAB;  Service: Cardiovascular;  Laterality: N/A;  . CARDIAC CATHETERIZATION N/A 08/07/2016   Procedure: Coronary/Graft Atherectomy-CSI LAD;  Surgeon: Peter M Martinique, MD;  Location: [place] CV LAB;  Service: Cardiovascular;  Laterality: N/A;  . CERVICAL SPINE SURGERY  2/09   "to repair nerve problems in my left arm"  . CHOLECYSTECTOMY    . CORONARY ANGIOPLASTY WITH STENT PLACEMENT  06/11/2008  . CORONARY ANGIOPLASTY WITH STENT PLACEMENT  06/2007   TAXUS stent to RCA/notes 01/31/2011  . ESOPHAGOGASTRODUODENOSCOPY  09/28/2011   Procedure: ESOPHAGOGASTRODUODENOSCOPY (EGD);  Surgeon: Missy Sabins, MD;  Location: [facility] ENDOSCOPY;  Service: Endoscopy;  Laterality: N/A;  . ESOPHAGOGASTRODUODENOSCOPY N/A 04/07/2015   Procedure: ESOPHAGOGASTRODUODENOSCOPY (EGD);  Surgeon: Teena Irani, MD;  Location: [facility];  Service: Endoscopy;  Laterality: N/A;  . ESOPHAGOGASTRODUODENOSCOPY N/A 04/19/2015   Procedure: ESOPHAGOGASTRODUODENOSCOPY (EGD);  Surgeon: Arta Silence, MD;  Location: [facility] ENDOSCOPY;  Service: Endoscopy;  Laterality: N/A;  . FOREIGN BODY REMOVAL  09/2003   via upper endoscopy/notes 02/12/2011  . GIVENS CAPSULE STUDY  09/30/2011   Procedure: GIVENS CAPSULE STUDY;  Surgeon: Jeryl Columbia, MD;  Location: [facility] ENDOSCOPY;  Service: Endoscopy;  Laterality: N/A;  . INSERTION OF DIALYSIS CATHETER Right 2014  . INSERTION OF DIALYSIS CATHETER Left 02/11/2013   Procedure: INSERTION OF DIALYSIS CATHETER;  Surgeon: Conrad Penton, MD;  Location: [place];  Service: Vascular;  Laterality: Left;  Ultrasound guided  . REMOVAL OF A DIALYSIS CATHETER Right 02/11/2013   Procedure: REMOVAL OF A DIALYSIS CATHETER;  Surgeon: Conrad Ridgemark, MD;  Location: [place];  Service: Vascular;   Laterality: Right;  . SAVORY DILATION N/A 04/07/2015   Procedure: SAVORY DILATION;  Surgeon: Teena Irani, MD;  Location: [facility] ENDOSCOPY;  Service: Endoscopy;  Laterality: N/A;  . SHUNTOGRAM N/A 09/20/2011   Procedure: Earney Mallet;  Surgeon: Conrad Newburg, MD;  Location[facility] C[facility] LAB;  Service: Cardiovascular;  Laterality: N/A;  . SVT ABLATION N/A 11/26/2016   Procedure: SVT Ablation;  Surgeon: Tamarius Rosenfield Meredith Leeds, MD;  Location: [place] CV LAB;  Service: Cardiovascular;  Laterality: N/A;  . TONSILLECTOMY    . TOTAL KNEE ARTHROPLASTY Right 08/02/2015   Procedure: TOTAL KNEE ARTHROPLASTY;  Surgeon: Renette Butters, MD;  Location: [place];  Service: Orthopedics;  Laterality: Right;  . VENOGRAM N/A 01/26/2013   Procedure: VENOGRAM;  Surgeon: Angelia Mould, MD;  Location: [facility] CATH LAB;  Service: Cardiovascular;  Laterality: N/A;     Current Outpatient Prescriptions  Medication Sig Dispense Refill  . acetaminophen (TYLENOL) 325 MG tablet Take 650 mg by mouth every 6 (six) hours as needed for mild pain or headache.     . allopurinol (ZYLOPRIM) 100 MG tablet Take 1 tablet (100 mg total) by mouth daily. 90 tablet 3  . aspirin 81 MG tablet Take 81 mg by mouth daily.    . citalopram (  CELEXA) 10 MG tablet Take 10 mg by mouth 3 (three) times a week.    . clopidogrel (PLAVIX) 75 MG tablet TAKE ONE TABLET BY MOUTH ONCE DAILY WITH  BREAKFAST 90 tablet 3  . colesevelam (WELCHOL) 625 MG tablet TAKE THREE TABLETS BY MOUTH TWICE DAILY WITH MEALS 180 tablet 11  . ezetimibe (ZETIA) 10 MG tablet Take 1 tablet (10 mg total) by mouth daily. 90 tablet 3  . gabapentin (NEURONTIN) 300 MG capsule 1-2 tabs by mouth at bedtime for sleep and pain 180 capsule 3  . HYDROcodone-acetaminophen (NORCO/VICODIN) 5-325 MG tablet Take 1 tablet by mouth every 6 (six) hours as needed for moderate pain.    . Lanthanum Carbonate 1000 MG PACK Take 3 packets by mouth 2 (two) times daily.     . methimazole (TAPAZOLE) 5 MG tablet Take 1  tablet (5 mg total) by mouth daily. 30 tablet 11  . metoprolol tartrate (LOPRESSOR) 25 MG tablet Take 1 tablet (25 mg total) by mouth 2 (two) times daily. ON NON-HD DAYS 144 tablet 3  . midodrine (PROAMATINE) 10 MG tablet Take 1 tablet (10 mg total) by mouth See admin instructions. Once a day only on dialysis days (Tues/Thurs/Sat) 30 tablet 0  . multivitamin (RENA-VIT) TABS tablet Take 1 tablet by mouth at bedtime. Reported on 10/24/2015    . pantoprazole (PROTONIX) 40 MG tablet Take 40 mg by mouth 2 (two) times daily.     . SENSIPAR 90 MG tablet Take 90 mg by mouth 2 (two) times daily.     No current facility-administered medications for this visit.     Allergies:   Cephalexin; Statins; and Ciprofloxacin   Social History:  The patient  reports that he quit smoking about 11 years ago. His smoking use included Cigarettes. He has a 25.00 pack-year smoking history. He quit smokeless tobacco use about 11 years ago. He reports that he does not drink alcohol or use drugs.   Family History:  The patient's family history includes Diabetes in his father; Healthy in his child, child, and child; Heart disease in his father and sister; Hypertension in his father; Kidney failure in his father; Thyroid nodules in his sister.    ROS:  Please see the history of present illness.   Otherwise, review of systems is positive for fatigue.   All other systems are reviewed and negative.     PHYSICAL EXAM: VS:  BP 128/68   Pulse 80   Ht 6\' 1"  (1.854 m)   Wt 238 lb (108 kg)   BMI 31.40 kg/m  , BMI Body mass index is 31.4 kg/m. GEN: Well nourished, well developed, in no acute distress  HEENT: normal  Neck: no JVD, carotid bruits, or masses Cardiac: RRR; no murmurs, rubs, or gallops,no edema  Respiratory:  clear to auscultation bilaterally, normal work of breathing GI: soft, nontender, nondistended, + BS MS: no deformity or atrophy  Skin: warm and dry Neuro:  Strength and sensation are intact Psych:  euthymic mood, full affect  EKG:  EKG is not ordered today. Personal review of the ekg ordered 12/27/16 shows sinus rhythm, APCs  Recent Labs: 12/27/2016: ALT 16; Magnesium 2.1; TSH 1.011 01/09/2017: BUN 46; Creatinine, Ser 8.51; Hemoglobin 11.2; Platelets 321.0; Potassium 4.1; Sodium 142    Lipid Panel     Component Value Date/Time   CHOL 268 (H) 10/18/2016 0232   TRIG 262 (H) 10/18/2016 0232   HDL 46 10/18/2016 0232   CHOLHDL 5.8 10/18/2016 0232  VLDL 52 (H) 10/18/2016 0232   LDLCALC 170 (H) 10/18/2016 0232   LDLDIRECT 136.0 07/16/2014 1503     Wt Readings from Last 3 Encounters:  03/25/17 238 lb (108 kg)  02/18/17 235 lb (106.6 kg)  01/23/17 224 lb 9.6 oz (101.9 kg)      Other studies Reviewed: Additional studies/ records that were reviewed today include: TTE 10/17/16, Cath 08/07/16  Review of the above records today demonstrates:  - Left ventricle: The cavity size was normal. Systolic function was   mildly to moderately reduced. The estimated ejection fraction was   in the range of 40% to 45%. Wall motion was normal; there were no   regional wall motion abnormalities. Features are consistent with   a pseudonormal left ventricular filling pattern, with concomitant   abnormal relaxation and increased filling pressure (grade 2   diastolic dysfunction). Doppler parameters are consistent with   elevated ventricular end-diastolic filling pressure. - Aortic valve: There was no regurgitation. - Aortic root: The aortic root was normal in size. - Mitral valve: There was trivial regurgitation. - Left atrium: The atrium was mildly dilated. - Right ventricle: Systolic function was normal. - Right atrium: The atrium was normal in size. - Pulmonary arteries: Systolic pressure was within the normal   range. - Inferior vena cava: The vessel was normal in size. - Pericardium, extracardiac: There was no pericardial effusion.   Mid LAD lesion, 90 %stenosed.  Ost LM to LM lesion, 30  %stenosed.  Prox Cx to Mid Cx lesion, 40 %stenosed.  1st Mrg lesion, 60 %stenosed.  [place] lesion, 40 %stenosed.  Prox RCA to Mid RCA lesion, 70 %stenosed.  Mid RCA lesion, 80 %stenosed.  Dist RCA lesion, 0 %stenosed.   Assessment:  1. Heavily calcified coronary arteries 2. High grade lesion in midLAD (90%) and borderline lesion midRCA (70-80%) 3. LVEDP 17   A STENT SYNERGY DES 3X16 drug eluting stent was successfully placed.  Mid LAD lesion, 90 %stenosed.  Post intervention, there is a 0% residual stenosis.   1. Successful orbital atherectomy and stenting of the mid LAD with DES  ASSESSMENT AND PLAN:  1.  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 Has done well without recurrence.  2. Chronic systolic heart failure: Fluid status monitored by dialysis. Likely a mix of ischemic and nonischemic. Unfortunately blood pressure drops with dialysis and thus unable to titrate medications.  3. Coronary artery disease: Currently on dual antiplatelet therapy. Had an STEMI due to SVT in the past. Has a drug-eluting stent in the LAD. No changes at this time as he is having no chest pain.  4. Hypertension: At this point, Elinora Weigand not titrate blood pressure medications as he is taking Midrin for dialysis days.    Current medicines are reviewed at length with the patient today.   The patient does not have concerns regarding his medicines.  The following changes were made today:    Labs/ tests ordered today include:  No orders of the defined types were placed in this encounter.    Disposition:   FU with Dracen Reigle 12 months  Signed, Casee Knepp Meredith Leeds, MD  03/25/2017 4:18 PM     [name] [place] [id number] [phone] (office) [phone] (fax)

## 2017-03-26 DIAGNOSIS — D631 Anemia in chronic kidney disease: Secondary | ICD-10-CM | POA: Diagnosis not present

## 2017-03-26 DIAGNOSIS — N2581 Secondary hyperparathyroidism of renal origin: Secondary | ICD-10-CM | POA: Diagnosis not present

## 2017-03-26 DIAGNOSIS — E119 Type 2 diabetes mellitus without complications: Secondary | ICD-10-CM | POA: Diagnosis not present

## 2017-03-26 DIAGNOSIS — Z23 Encounter for immunization: Secondary | ICD-10-CM | POA: Diagnosis not present

## 2017-03-26 DIAGNOSIS — D509 Iron deficiency anemia, unspecified: Secondary | ICD-10-CM | POA: Diagnosis not present

## 2017-03-26 DIAGNOSIS — N186 End stage renal disease: Secondary | ICD-10-CM | POA: Diagnosis not present

## 2017-03-27 ENCOUNTER — Encounter: Payer: Self-pay | Admitting: Physical Therapy

## 2017-03-27 ENCOUNTER — Ambulatory Visit: Payer: Medicare Other | Admitting: Physical Therapy

## 2017-03-27 DIAGNOSIS — M6281 Muscle weakness (generalized): Secondary | ICD-10-CM | POA: Diagnosis not present

## 2017-03-27 DIAGNOSIS — R296 Repeated falls: Secondary | ICD-10-CM

## 2017-03-27 DIAGNOSIS — R262 Difficulty in walking, not elsewhere classified: Secondary | ICD-10-CM | POA: Diagnosis not present

## 2017-03-27 NOTE — Therapy (Signed)
[facility] [place], [place], [address] Phone: [phone]   Fax:  [phone]  Physical Therapy Treatment  Patient Details  Name: [name] MRN: [id number] Date of Birth: 1951-05-20 Referring Provider: Ellouise Newer  Encounter Date: 03/27/2017      PT End of Session - 03/27/17 1552    Visit Number 7   Date for PT Re-Evaluation 04/29/17   PT Start Time 1515   PT Stop Time 1557   PT Time Calculation (min) 42 min      Past Medical History:  Diagnosis Date  . Allergic rhinitis, cause unspecified 02/24/2014  . Anemia 06/16/2011  . BENIGN PROSTATIC HYPERTROPHY 10/14/2009  . CAD, NATIVE VESSEL 02/06/2009   saw Dr. Missy Sabins last jan 2013  . Cervical radiculopathy, chronic 02/23/2016   Right c5-6 by NCS/EMG  . CHEST PAIN 03/29/2010  . COLONIC POLYPS, HX OF 10/14/2009  . CONGESTIVE HEART FAILURE 06/18/2007  . Dementia [id number]  . DEPRESSION 10/14/2009  . Depression 09/24/2015  . DIABETES MELLITUS, TYPE II 02/01/2010  . DIASTOLIC HEART FAILURE, CHRONIC 02/06/2009  . DIZZINESS 07/17/2010  . DYSLIPIDEMIA 06/18/2007  . DYSPNEA 10/29/2008  . ESRD (end stage renal disease) on dialysis ([place]) 08/04/2010   "TTS;  " (04/18/2015)  . FOOT PAIN 08/12/2008  . GAIT DISTURBANCE 03/03/2010  . GASTROENTERITIS, VIRAL 10/14/2009  . GERD 06/18/2007  . GOITER, MULTINODULAR 12/26/2007  . GOUT 06/18/2007  . GYNECOMASTIA 07/17/2010  . Hemodialysis access, fistula mature [facility])    Dialysis T-Th-Sa ([place]) Right upper arm fistula  . Hyperlipidemia 10/16/2011  . Hyperparathyroidism, secondary ([place]) 06/16/2011  . HYPERTENSION 06/18/2007  . Hyperthyroidism   . HYPERTHYROIDISM 02/02/2010  . Hypocalcemia 06/07/2010  . Ischemic cardiomyopathy 06/16/2011  . NECK PAIN 07/31/2010  . NSTEMI (non-ST elevated myocardial infarction) ([place]) 08/04/2016  . ONYCHOMYCOSIS, TOENAILS 12/26/2007  . OSA on CPAP 10/16/2011  . Other malaise and  fatigue 11/24/2009  . PERIPHERAL NEUROPATHY 06/18/2007  . Prostate cancer ([place])   . PULMONARY NODULE, RIGHT LOWER LOBE 06/08/2009  . Renal insufficiency   . Sleep apnea    cpap machine and o2  . TRANSAMINASES, SERUM, ELEVATED 02/01/2010  . Transfusion history    none recent  . Unspecified hypotension 01/30/2010    Past Surgical History:  Procedure Laterality Date  . ARTERIOVENOUS GRAFT PLACEMENT Right 2009   forearm/notes 02/01/2011  . AV FISTULA PLACEMENT  11/07/2011   Procedure: INSERTION OF ARTERIOVENOUS (AV) GORE-TEX GRAFT ARM;  Surgeon: Tinnie Gens, MD;  Location: [facility];  Service: Vascular;  Laterality: Left;  . BACK SURGERY  1998  . BASCILIC VEIN TRANSPOSITION Right 02/27/2013   Procedure: BASCILIC VEIN TRANSPOSITION;  Surgeon: Mal Misty, MD;  Location: [facility];  Service: Vascular;  Laterality: Right;  Right Basilic Vein Transposition   . CARDIAC CATHETERIZATION N/A 08/06/2016   Procedure: Left Heart Cath and Coronary Angiography;  Surgeon: Jolaine Artist, MD;  Location: [facility] CV LAB;  Service: Cardiovascular;  Laterality: N/A;  . CARDIAC CATHETERIZATION N/A 08/07/2016   Procedure: Coronary/Graft Atherectomy-CSI LAD;  Surgeon: Peter M Martinique, MD;  Location: [facility] CV LAB;  Service: Cardiovascular;  Laterality: N/A;  . CERVICAL SPINE SURGERY  2/09   "to repair nerve problems in my left arm"  . CHOLECYSTECTOMY    . CORONARY ANGIOPLASTY WITH STENT PLACEMENT  06/11/2008  . CORONARY ANGIOPLASTY WITH STENT PLACEMENT  06/2007   TAXUS stent to RCA/notes 01/31/2011  . ESOPHAGOGASTRODUODENOSCOPY  09/28/2011   Procedure: ESOPHAGOGASTRODUODENOSCOPY (EGD);  Surgeon: Missy Sabins, MD;  Location: [facility] ENDOSCOPY;  Service: Endoscopy;  Laterality: N/A;  . ESOPHAGOGASTRODUODENOSCOPY N/A 04/07/2015   Procedure: ESOPHAGOGASTRODUODENOSCOPY (EGD);  Surgeon: Teena Irani, MD;  Location: [facility] ENDOSCOPY;  Service: Endoscopy;  Laterality: N/A;  . ESOPHAGOGASTRODUODENOSCOPY N/A 04/19/2015   Procedure:  ESOPHAGOGASTRODUODENOSCOPY (EGD);  Surgeon: Arta Silence, MD;  Location: [facility] ENDOSCOPY;  Service: Endoscopy;  Laterality: N/A;  . FOREIGN BODY REMOVAL  09/2003   via upper endoscopy/notes 02/12/2011  . GIVENS CAPSULE STUDY  09/30/2011   Procedure: GIVENS CAPSULE STUDY;  Surgeon: Jeryl Columbia, MD;  Location[facility] ENDOSCOPY;  Service: Endoscopy;  Laterality: N/A;  . INSERTION OF DIALYSIS CATHETER Right 2014  . INSERTION OF DIALYSIS CATHETER Left 02/11/2013   Procedure: INSERTION OF DIALYSIS CATHETER;  Surgeon: Conrad Toa Alta, MD;  Location: [facility];  Service: Vascular;  Laterality: Left;  Ultrasound guided  . REMOVAL OF A DIALYSIS CATHETER Right 02/11/2013   Procedure: REMOVAL OF A DIALYSIS CATHETER;  Surgeon: Conrad Winnsboro, MD;  Location: [facility];  Service: Vascular;  Laterality: Right;  . SAVORY DILATION N/A 04/07/2015   Procedure: SAVORY DILATION;  Surgeon: Teena Irani, MD;  Location: [facility] ENDOSCOPY;  Service: Endoscopy;  Laterality: N/A;  . SHUNTOGRAM N/A 09/20/2011   Procedure: Earney Mallet;  Surgeon: Conrad Downsville, MD;  Location: [facility] CATH LAB;  Service: Cardiovascular;  Laterality: N/A;  . SVT ABLATION N/A 11/26/2016   Procedure: SVT Ablation;  Surgeon: Will Meredith Leeds, MD;  Location: [facility] CV LAB;  Service: Cardiovascular;  Laterality: N/A;  . TONSILLECTOMY    . TOTAL KNEE ARTHROPLASTY Right 08/02/2015   Procedure: TOTAL KNEE ARTHROPLASTY;  Surgeon: Renette Butters, MD;  Location: [facility];  Service: Orthopedics;  Laterality: Right;  . VENOGRAM N/A 01/26/2013   Procedure: VENOGRAM;  Surgeon: Angelia Mould, MD;  Location: [facility] CATH LAB;  Service: Cardiovascular;  Laterality: N/A;    There were no vitals filed for this visit.      Subjective Assessment - 03/27/17 1513    Subjective "Doing all right"   Currently in Pain? No/denies            [facility] PT Assessment - 03/27/17 0001      Berg Balance Test   Sit to Stand Able to stand without using hands and stabilize independently    Standing Unsupported Able to stand 2 minutes with supervision   Sitting with Back Unsupported but Feet Supported on Floor or Stool Able to sit safely and securely 2 minutes   Stand to Sit Controls descent by using hands   Transfers Able to transfer safely, definite need of hands   Standing Unsupported with Eyes Closed Able to stand 10 seconds with supervision   Standing Ubsupported with Feet Together Able to place feet together independently and stand for 1 minute with supervision   From Standing, Reach Forward with Outstretched Arm Can reach forward >12 cm safely (5")   From Standing Position, Pick up Object from Floor Able to pick up shoe, needs supervision   From Standing Position, Turn to Look Behind Over each Shoulder Turn sideways only but maintains balance   Turn 360 Degrees Able to turn 360 degrees safely but slowly   Standing Unsupported, Alternately Place Feet on Step/Stool Able to complete >2 steps/needs minimal assist   Standing Unsupported, One Foot in Front Loses balance while stepping or standing   Standing on One Leg Unable to try or needs assist to prevent  fall   Total Score 34                     [facility] Adult PT Treatment/Exercise - 03/27/17 0001      Knee/Hip Exercises: Aerobic   Nustep level 5x33mnutes   Other Aerobic UBE level 4 4 frd/4rev                  PT Short Term Goals - 02/27/17 1424      PT SHORT TERM GOAL #1   Title indepednent with HEP   Time 2   Period Weeks   Status New           PT Long Term Goals - 03/27/17 1553      PT LONG TERM GOAL #2   Title increase Berg balance test score to 36/56   Status Partially Met               Plan - 03/27/17 1553    Clinical Impression Statement Pt is progressing towards goals Pt with an 12 point increase on BERG balance score. Pt very fatigue with today's interventions requiring increase rest. 2 min stand seemed to be the most taxing.   Rehab Potential Fair   PT Frequency  2x / week   PT Duration 8 weeks   PT Treatment/Interventions ADLs/Self Care Home Management;Gait training;Stair training;Functional mobility training;Patient/family education;Neuromuscular re-education;Balance training;Therapeutic exercise;Therapeutic activities;Manual techniques   PT Next Visit Plan exercises for strength and balance      Patient will benefit from skilled therapeutic intervention in order to improve the following deficits and impairments:  Abnormal gait, Cardiopulmonary status limiting activity, Decreased activity tolerance, Decreased balance, Decreased mobility, Decreased strength, Difficulty walking  Visit Diagnosis: Difficulty in walking, not elsewhere classified  Repeated falls  Muscle weakness (generalized)     Problem List Patient Active Problem List   Diagnosis Date Noted  . Weakness 12/27/2016  . Generalized weakness 12/27/2016  . SVT (supraventricular tachycardia) ([place] 10/16/2016  . Abdominal pain 08/27/2016  . Abnormal findings on diagnostic imaging of liver and biliary tract 08/27/2016  . Bloating symptom 08/27/2016  . Hematochezia 08/27/2016  . Early satiety 08/27/2016  . Eructation 08/27/2016  . Esophageal ulcer 08/27/2016  . Flatulence, eructation and gas pain 08/27/2016  . General weakness 08/27/2016  . Skin sensation disturbance 08/27/2016  . Stricture of esophagus 08/27/2016  . Alternating constipation and diarrhea 08/16/2016  . Status post coronary artery stent placement   . Abnormal EKG   . ST segment depression 08/05/2016  . PAD (peripheral artery disease) ([place] 06/18/2016  . Transient hypotension 04/29/2016  . Cervical radiculopathy, chronic 02/23/2016  . Memory loss 12/22/2015  . Left-sided weakness 12/22/2015  . Peripheral polyneuropathy 12/22/2015  . DM (diabetes mellitus) type II controlled with renal manifestation ([place] 10/31/2015  . Nausea and vomiting 10/31/2015  . Malignant neoplasm of prostate ([place] 10/24/2015  . Right  leg pain 09/04/2015  . Chest pain with high risk for cardiac etiology 08/09/2015  . Acute encephalopathy 08/09/2015  . S/P right total knee replacement 08/09/2015  . Primary osteoarthritis of right knee 08/05/2015  . Neuropathy due to secondary diabetes mellitus ([place] 08/05/2015  . Arthritis of knee 08/02/2015  . Headache 06/08/2015  . Melena 04/19/2015  . Acute blood loss anemia 04/18/2015  . Hyperthyroidism 03/11/2015  . Thigh pain 12/08/2014  . Nodule of right lung 09/14/2014  . PSA elevation 08/30/2014  . ESRD on hemodialysis ([place] 08/16/2014  . Essential hypertension 08/16/2014  . Pre-transplant  evaluation for kidney transplant 08/16/2014  . Allergic rhinitis 02/24/2014  . Elevated PSA 02/24/2014  . Vertigo 10/14/2013  . Cough 06/26/2013  . Numbness 05/15/2013  . Chronic systolic heart failure ([place]) 02/09/2013  . Other complications due to renal dialysis device, implant, and graft 01/14/2013  . Right hand pain 03/28/2012  . Dysphagia 03/28/2012  . Hypogonadism, male 03/28/2012  . OSA (obstructive sleep apnea) 10/16/2011  . Hyperlipidemia 10/16/2011  . CAD (coronary artery disease) 09/28/2011  . NSTEMI (non-ST elevated myocardial infarction) ([place]) 09/28/2011  . AVM (arteriovenous malformation) 09/27/2011  . Anemia associated with acute blood loss 09/26/2011  . Anemia 06/16/2011  . Ischemic cardiomyopathy 06/16/2011  . Hyperparathyroidism, secondary ([place]) 06/16/2011  . Preventative health care 03/11/2011  . NECK PAIN 07/31/2010  . GYNECOMASTIA 07/17/2010  . DIZZINESS 07/17/2010  . GAIT DISTURBANCE 03/03/2010  . Thyrotoxicosis 02/02/2010  . Elevated levels of transaminase & lactic acid dehydrogenase 02/01/2010  . Other malaise and fatigue 11/24/2009  . Depression 10/14/2009  . BENIGN PROSTATIC HYPERTROPHY 10/14/2009  . COLONIC POLYPS, HX OF 10/14/2009  . Dementia 09/02/2009  . PULMONARY NODULE, RIGHT LOWER LOBE 06/08/2009  . Chronic diastolic congestive heart  failure ([place]) 02/06/2009  . DYSPNEA 10/29/2008  . FOOT PAIN 08/12/2008  . ONYCHOMYCOSIS, TOENAILS 12/26/2007  . GOITER, MULTINODULAR 12/26/2007  . Gout 06/18/2007  . Neuropathy ([place]) 06/18/2007  . Hypertensive heart disease with CHF (congestive heart failure) ([place]) 06/18/2007  . Gastro-esophageal reflux disease without esophagitis 06/18/2007    Scot Jun, PTA 03/27/2017, 4:01 PM  [facility], [place], [address] Phone: [phone]   Fax:  [phone]  Name: [name] MRN: [id number] Date of Birth: September 12, 1951

## 2017-03-28 DIAGNOSIS — D631 Anemia in chronic kidney disease: Secondary | ICD-10-CM | POA: Diagnosis not present

## 2017-03-28 DIAGNOSIS — E119 Type 2 diabetes mellitus without complications: Secondary | ICD-10-CM | POA: Diagnosis not present

## 2017-03-28 DIAGNOSIS — N2581 Secondary hyperparathyroidism of renal origin: Secondary | ICD-10-CM | POA: Diagnosis not present

## 2017-03-28 DIAGNOSIS — N186 End stage renal disease: Secondary | ICD-10-CM | POA: Diagnosis not present

## 2017-03-28 DIAGNOSIS — D509 Iron deficiency anemia, unspecified: Secondary | ICD-10-CM | POA: Diagnosis not present

## 2017-03-28 DIAGNOSIS — Z23 Encounter for immunization: Secondary | ICD-10-CM | POA: Diagnosis not present

## 2017-03-30 DIAGNOSIS — D509 Iron deficiency anemia, unspecified: Secondary | ICD-10-CM | POA: Diagnosis not present

## 2017-03-30 DIAGNOSIS — Z23 Encounter for immunization: Secondary | ICD-10-CM | POA: Diagnosis not present

## 2017-03-30 DIAGNOSIS — D631 Anemia in chronic kidney disease: Secondary | ICD-10-CM | POA: Diagnosis not present

## 2017-03-30 DIAGNOSIS — Z992 Dependence on renal dialysis: Secondary | ICD-10-CM | POA: Diagnosis not present

## 2017-03-30 DIAGNOSIS — N186 End stage renal disease: Secondary | ICD-10-CM | POA: Diagnosis not present

## 2017-03-30 DIAGNOSIS — N2581 Secondary hyperparathyroidism of renal origin: Secondary | ICD-10-CM | POA: Diagnosis not present

## 2017-03-30 DIAGNOSIS — E1129 Type 2 diabetes mellitus with other diabetic kidney complication: Secondary | ICD-10-CM | POA: Diagnosis not present

## 2017-03-30 DIAGNOSIS — E119 Type 2 diabetes mellitus without complications: Secondary | ICD-10-CM | POA: Diagnosis not present

## 2017-04-01 ENCOUNTER — Ambulatory Visit: Payer: Medicare Other | Attending: Neurology | Admitting: Physical Therapy

## 2017-04-01 ENCOUNTER — Other Ambulatory Visit: Payer: Self-pay

## 2017-04-01 ENCOUNTER — Encounter: Payer: Self-pay | Admitting: Physical Therapy

## 2017-04-01 DIAGNOSIS — R296 Repeated falls: Secondary | ICD-10-CM | POA: Insufficient documentation

## 2017-04-01 DIAGNOSIS — M6281 Muscle weakness (generalized): Secondary | ICD-10-CM | POA: Diagnosis not present

## 2017-04-01 DIAGNOSIS — R262 Difficulty in walking, not elsewhere classified: Secondary | ICD-10-CM | POA: Diagnosis not present

## 2017-04-01 NOTE — Therapy (Signed)
[facility] [address] [place], [place], [address] Phone: [phone]   Fax:  [phone]  Physical Therapy Treatment  Patient Details  Name: [name] MRN: [id number] Date of Birth: 07/31/1951 Referring Provider: Karen Aquino  Encounter Date: 04/01/2017      PT End of Session - 04/01/17 1340    Visit Number 8   Date for PT Re-Evaluation 04/29/17   PT Start Time 1301   PT Stop Time 1342   PT Time Calculation (min) 41 min   Activity Tolerance Patient tolerated treatment well;Patient limited by fatigue   Behavior During Therapy WFL for tasks assessed/performed      Past Medical History:  Diagnosis Date  . Allergic rhinitis, cause unspecified 02/24/2014  . Anemia 06/16/2011  . BENIGN PROSTATIC HYPERTROPHY 10/14/2009  . CAD, NATIVE VESSEL 02/06/2009   saw Dr. Benshimon last jan 2013  . Cervical radiculopathy, chronic 02/23/2016   Right c5-6 by NCS/EMG  . CHEST PAIN 03/29/2010  . COLONIC POLYPS, HX OF 10/14/2009  . CONGESTIVE HEART FAILURE 06/18/2007  . Dementia [id number]  . DEPRESSION 10/14/2009  . Depression 09/24/2015  . DIABETES MELLITUS, TYPE II 02/01/2010  . DIASTOLIC HEART FAILURE, CHRONIC 02/06/2009  . DIZZINESS 07/17/2010  . DYSLIPIDEMIA 06/18/2007  . DYSPNEA 10/29/2008  . ESRD (end stage renal disease) on dialysis ([name]) 08/04/2010   "TTS;  " (04/18/2015)  . FOOT PAIN 08/12/2008  . GAIT DISTURBANCE 03/03/2010  . GASTROENTERITIS, VIRAL 10/14/2009  . GERD 06/18/2007  . GOITER, MULTINODULAR 12/26/2007  . GOUT 06/18/2007  . GYNECOMASTIA 07/17/2010  . Hemodialysis access, fistula mature ([name])    Dialysis T-Th-Sa ([facility]) Right upper arm fistula  . Hyperlipidemia 10/16/2011  . Hyperparathyroidism, secondary ([name]) 06/16/2011  . HYPERTENSION 06/18/2007  . Hyperthyroidism   . HYPERTHYROIDISM 02/02/2010  . Hypocalcemia 06/07/2010  . Ischemic cardiomyopathy 06/16/2011  . NECK PAIN 07/31/2010  . NSTEMI  (non-ST elevated myocardial infarction) ([name]) 08/04/2016  . ONYCHOMYCOSIS, TOENAILS 12/26/2007  . OSA on CPAP 10/16/2011  . Other malaise and fatigue 11/24/2009  . PERIPHERAL NEUROPATHY 06/18/2007  . Prostate cancer ([name])   . PULMONARY NODULE, RIGHT LOWER LOBE 06/08/2009  . Renal insufficiency   . Sleep apnea    cpap machine and o2  . TRANSAMINASES, SERUM, ELEVATED 02/01/2010  . Transfusion history    none recent  . Unspecified hypotension 01/30/2010    Past Surgical History:  Procedure Laterality Date  . ARTERIOVENOUS GRAFT PLACEMENT Right 2009   forearm/notes 02/01/2011  . AV FISTULA PLACEMENT  11/07/2011   Procedure: INSERTION OF ARTERIOVENOUS (AV) GORE-TEX GRAFT ARM;  Surgeon: Donold Lawson, MD;  [facility];  Service: Vascular;  Laterality: Left;  . BACK SURGERY  1998  . BASCILIC VEIN TRANSPOSITION Right 02/27/2013   Procedure: BASCILIC VEIN TRANSPOSITION;  Surgeon: Shyne D Lawson, MD;  [facility] OR;  Service: Vascular;  Laterality: Right;  Right Basilic Vein Transposition   . CARDIAC CATHETERIZATION N/A 08/06/2016   Procedure: Left Heart Cath and Coronary Angiography;  Surgeon: Daniel R Bensimhon, MD;  [facility] INVASIVE CV LAB;  Service: Cardiovascular;  Laterality: N/A;  . CARDIAC CATHETERIZATION N/A 08/07/2016   Procedure: Coronary/Graft Atherectomy-CSI LAD;  Surgeon: Peter M [place], MD;  [facility] INVASIVE CV LAB;  Service: Cardiovascular;  Laterality: N/A;  . CERVICAL SPINE SURGERY  2/09   "to repair nerve problems in my left arm"  . CHOLECYSTECTOMY    . CORONARY ANGIOPLASTY WITH STENT PLACEMENT    06/11/2008  . CORONARY ANGIOPLASTY WITH STENT PLACEMENT  06/2007   TAXUS stent to RCA/notes 01/31/2011  . ESOPHAGOGASTRODUODENOSCOPY  09/28/2011   Procedure: ESOPHAGOGASTRODUODENOSCOPY (EGD);  Surgeon: John C Hayes, MD;  [facility] ENDOSCOPY;  Service: Endoscopy;  Laterality: N/A;  . ESOPHAGOGASTRODUODENOSCOPY N/A 04/07/2015   Procedure: ESOPHAGOGASTRODUODENOSCOPY (EGD);  Surgeon:  John Hayes, MD;  [facility] ENDOSCOPY;  Service: Endoscopy;  Laterality: N/A;  . ESOPHAGOGASTRODUODENOSCOPY N/A 04/19/2015   Procedure: ESOPHAGOGASTRODUODENOSCOPY (EGD);  Surgeon: William Outlaw, MD;  [facility] ENDOSCOPY;  Service: Endoscopy;  Laterality: N/A;  . FOREIGN BODY REMOVAL  09/2003   via upper endoscopy/notes 02/12/2011  . GIVENS CAPSULE STUDY  09/30/2011   Procedure: GIVENS CAPSULE STUDY;  Surgeon: Marc E Magod, MD;  [facility] ENDOS[facility];  Service: Endoscopy;  Laterality: N/A;  . INSERTION OF DIALYSIS CATHETER Right 2014  . INSERTION OF DIALYSIS CATHETER Left 02/11/2013   Procedure: INSERTION OF DIALYSIS CATHETER;  Surgeon: Brian L Chen, MD;  [facility];  Service: Vascular;  Laterality: Left;  Ultrasound guided  . REMOVAL OF A DIALYSIS CATHETER Right 02/11/2013   Procedure: REMOVAL OF A DIALYSIS CATHETER;  Surgeon: Brian L Chen, MD;  [facility];  Service: Vascular;  Laterality: Right;  . SAVORY DILATION N/A 04/07/2015   Procedure: SAVORY DILATION;  Surgeon: John Hayes, MD;  [facility] ENDOSCOPY;  Service: Endoscopy;  Laterality: N/A;  . SHUNTOGRAM N/A 09/20/2011   Procedure: SHUNTOGRAM;  Surgeon: Brian L Chen, MD;  [facility] CATH LAB;  Service: Cardiovascular;  Laterality: N/A;  . SVT ABLATION N/A 11/26/2016   Procedure: SVT Ablation;  Surgeon: Will Martin Camnitz, MD;  [facility];  Service: Cardiovascular;  Laterality: N/A;  . TONSILLECTOMY    . TOTAL KNEE ARTHROPLASTY Right 08/02/2015   Procedure: TOTAL KNEE ARTHROPLASTY;  Surgeon: Timothy D Murphy, MD;  [facility];  Service: Orthopedics;  Laterality: Right;  . VENOGRAM N/A 01/26/2013   Procedure: VENOGRAM;  Surgeon: Christopher S Dickson, MD;  [facility] CATH LAB;  Service: Cardiovascular;  Laterality: N/A;    There were no vitals filed for this visit.      Subjective Assessment - 04/01/17 1302    Subjective "Ok, Ok"   Currently in Pain? No/denies                          [facility] Adult PT Treatment/Exercise - 04/01/17 0001      High Level Balance   High Level Balance Activities Side stepping  no support in ront of mat abble    High Level Balance Comments standing reach with yellow ball 2x10' Standing march HHA x1 2x10     Lumbar Exercises: Standing   Row Theraband;Both;20 reps  x2   Theraband Level (Row) Level 4 (Blue)     Knee/Hip Exercises: Aerobic   Nustep level 5 x8minutes   Other Aerobic UBE level 4 4 frd/4rev     Knee/Hip Exercises: Machines for Strengthening   Cybex Leg Press 50lb 2x10                  PT Short Term Goals - 02/27/17 1424      PT SHORT TERM GOAL #1   Title indepednent with HEP   Time 2   Period Weeks   Status New           PT Long Term Goals - 03/27/17 1553      PT LONG TERM GOAL #2     Title increase Berg balance test score to 36/56   Status Partially Met               Plan - 04/01/17 1340    Clinical Impression Statement Pt very fatigue from today's interventions requiring multiple seated rest breaks. Reports no pain just decrease endurance. Cues required to maintain an upright posture, pt tends to lean forward when standing.   Rehab Potential Fair   PT Frequency 2x / week   PT Duration 8 weeks   PT Treatment/Interventions ADLs/Self Care Home Management;Gait training;Stair training;Functional mobility training;Patient/family education;Neuromuscular re-education;Balance training;Therapeutic exercise;Therapeutic activities;Manual techniques   PT Next Visit Plan exercises for strength and balance      Patient will benefit from skilled therapeutic intervention in order to improve the following deficits and impairments:  Abnormal gait, Cardiopulmonary status limiting activity, Decreased activity tolerance, Decreased balance, Decreased mobility, Decreased strength, Difficulty walking  Visit Diagnosis: Repeated falls  Muscle weakness  (generalized)  Difficulty in walking, not elsewhere classified     Problem List Patient Active Problem List   Diagnosis Date Noted  . Weakness 12/27/2016  . Generalized weakness 12/27/2016  . SVT (supraventricular tachycardia) ([place]) 10/16/2016  . Abdominal pain 08/27/2016  . Abnormal findings on diagnostic imaging of liver and biliary tract 08/27/2016  . Bloating symptom 08/27/2016  . Hematochezia 08/27/2016  . Early satiety 08/27/2016  . Eructation 08/27/2016  . Esophageal ulcer 08/27/2016  . Flatulence, eructation and gas pain 08/27/2016  . General weakness 08/27/2016  . Skin sensation disturbance 08/27/2016  . Stricture of esophagus 08/27/2016  . Alternating constipation and diarrhea 08/16/2016  . Status post coronary artery stent placement   . Abnormal EKG   . ST segment depression 08/05/2016  . PAD (peripheral artery disease) ([place]) 06/18/2016  . Transient hypotension 04/29/2016  . Cervical radiculopathy, chronic 02/23/2016  . Memory loss 12/22/2015  . Left-sided weakness 12/22/2015  . Peripheral polyneuropathy 12/22/2015  . DM (diabetes mellitus) type II controlled with renal manifestation ([place]) 10/31/2015  . Nausea and vomiting 10/31/2015  . Malignant neoplasm of prostate ([place]) 10/24/2015  . Right leg pain 09/04/2015  . Chest pain with high risk for cardiac etiology 08/09/2015  . Acute encephalopathy 08/09/2015  . S/P right total knee replacement 08/09/2015  . Primary osteoarthritis of right knee 08/05/2015  . Neuropathy due to secondary diabetes mellitus ([place]) 08/05/2015  . Arthritis of knee 08/02/2015  . Headache 06/08/2015  . Melena 04/19/2015  . Acute blood loss anemia 04/18/2015  . Hyperthyroidism 03/11/2015  . Thigh pain 12/08/2014  . Nodule of right lung 09/14/2014  . PSA elevation 08/30/2014  . ESRD on hemodialysis ([place]) 08/16/2014  . Essential hypertension 08/16/2014  . Pre-transplant evaluation for kidney transplant 08/16/2014  . Allergic rhinitis  02/24/2014  . Elevated PSA 02/24/2014  . Vertigo 10/14/2013  . Cough 06/26/2013  . Numbness 05/15/2013  . Chronic systolic heart failure ([place]) 02/09/2013  . Other complications due to renal dialysis device, implant, and graft 01/14/2013  . Right hand pain 03/28/2012  . Dysphagia 03/28/2012  . Hypogonadism, male 03/28/2012  . OSA (obstructive sleep apnea) 10/16/2011  . Hyperlipidemia 10/16/2011  . CAD (coronary artery disease) 09/28/2011  . NSTEMI (non-ST elevated myocardial infarction) ([place]) 09/28/2011  . AVM (arteriovenous malformation) 09/27/2011  . Anemia associated with acute blood loss 09/26/2011  . Anemia 06/16/2011  . Ischemic cardiomyopathy 06/16/2011  . Hyperparathyroidism, secondary ([place]) 06/16/2011  . Preventative health care 03/11/2011  . NECK PAIN 07/31/2010  . GYNECOMASTIA 07/17/2010  . DIZZINESS  07/17/2010  . GAIT DISTURBANCE 03/03/2010  . Thyrotoxicosis 02/02/2010  . Elevated levels of transaminase & lactic acid dehydrogenase 02/01/2010  . Other malaise and fatigue 11/24/2009  . Depression 10/14/2009  . BENIGN PROSTATIC HYPERTROPHY 10/14/2009  . COLONIC POLYPS, HX OF 10/14/2009  . Dementia 09/02/2009  . PULMONARY NODULE, RIGHT LOWER LOBE 06/08/2009  . Chronic diastolic congestive heart failure ([name]) 02/06/2009  . DYSPNEA 10/29/2008  . FOOT PAIN 08/12/2008  . ONYCHOMYCOSIS, TOENAILS 12/26/2007  . GOITER, MULTINODULAR 12/26/2007  . Gout 06/18/2007  . Neuropathy ([name]) 06/18/2007  . Hypertensive heart disease with CHF (congestive heart failure) ([name]) 06/18/2007  . Gastro-esophageal reflux disease without esophagitis 06/18/2007     G , PTA 04/01/2017, 1:42 PM  [facility] [address] [place], [place], [address] Phone: [phone]   Fax:  [phone]  Name: [name] MRN: [id number] Date of Birth: 09/09/1951   

## 2017-04-01 NOTE — Patient Outreach (Signed)
[facility]) Care Management  04/01/2017  [name] [birth year] [id number]   Successful outreach completed with patient's wife, [name]. Patient identification verified.   RNCM rescheduled home visit due to conflicts in schedule and discussed with patient's wife. They were agreeable with reschedule. They did not have any availability this week and RNCM will not be in the office next week, so scheduled for 2 weeks.  RNCM completed outreach with patient and wife to follow up. Patient continues to be concerned with fatigue, stating he needs to sleep after getting home from dialysis. He denies any shortness of breath, dry cough, weight gain, edema in extremities. He did state that if he exerts himsef he does get a little winded and feels that should not be happening.   Patient is continuing to participate in outpatient rehabilitation for strengthening and to help with his mobility. He denies any concerns related to this. His wife is taking him to his appointments.  Plan: Home visit rescheduled for 2 weeks. Patient continues to weigh daily and has no signs/symptoms of heart failure. Encouraged to call with any questions/concerns.  [facility] CM Care Plan Problem One     Most Recent Value  Care Plan Problem One  Patient at risk for fluid volume overload as evidenced by dyspnea related to fluid volume overload during last admission  Role Documenting the Problem One  Care Management [place] for Problem One  Active  [facility] Long Term Goal   Patient will not have a hospital admisson related to heart failure in next 90 days  [facility] Long Term Goal Start Date  02/07/17  Interventions for Problem One Long Term Goal  RNCM provided education about heart failure, HF zones, ensured he has follow up appointments and is taking medications     [place] R. Shizuko Wojdyla, RN, BSN, [place] Management Coordinator [phone]

## 2017-04-02 DIAGNOSIS — E119 Type 2 diabetes mellitus without complications: Secondary | ICD-10-CM | POA: Diagnosis not present

## 2017-04-02 DIAGNOSIS — D631 Anemia in chronic kidney disease: Secondary | ICD-10-CM | POA: Diagnosis not present

## 2017-04-02 DIAGNOSIS — N186 End stage renal disease: Secondary | ICD-10-CM | POA: Diagnosis not present

## 2017-04-02 DIAGNOSIS — D509 Iron deficiency anemia, unspecified: Secondary | ICD-10-CM | POA: Diagnosis not present

## 2017-04-02 DIAGNOSIS — N2581 Secondary hyperparathyroidism of renal origin: Secondary | ICD-10-CM | POA: Diagnosis not present

## 2017-04-04 DIAGNOSIS — D631 Anemia in chronic kidney disease: Secondary | ICD-10-CM | POA: Diagnosis not present

## 2017-04-04 DIAGNOSIS — N2581 Secondary hyperparathyroidism of renal origin: Secondary | ICD-10-CM | POA: Diagnosis not present

## 2017-04-04 DIAGNOSIS — N186 End stage renal disease: Secondary | ICD-10-CM | POA: Diagnosis not present

## 2017-04-04 DIAGNOSIS — D509 Iron deficiency anemia, unspecified: Secondary | ICD-10-CM | POA: Diagnosis not present

## 2017-04-04 DIAGNOSIS — E119 Type 2 diabetes mellitus without complications: Secondary | ICD-10-CM | POA: Diagnosis not present

## 2017-04-05 ENCOUNTER — Other Ambulatory Visit (HOSPITAL_COMMUNITY): Payer: Self-pay | Admitting: Internal Medicine

## 2017-04-06 DIAGNOSIS — D631 Anemia in chronic kidney disease: Secondary | ICD-10-CM | POA: Diagnosis not present

## 2017-04-06 DIAGNOSIS — D509 Iron deficiency anemia, unspecified: Secondary | ICD-10-CM | POA: Diagnosis not present

## 2017-04-06 DIAGNOSIS — N186 End stage renal disease: Secondary | ICD-10-CM | POA: Diagnosis not present

## 2017-04-06 DIAGNOSIS — N2581 Secondary hyperparathyroidism of renal origin: Secondary | ICD-10-CM | POA: Diagnosis not present

## 2017-04-06 DIAGNOSIS — E119 Type 2 diabetes mellitus without complications: Secondary | ICD-10-CM | POA: Diagnosis not present

## 2017-04-08 ENCOUNTER — Encounter: Payer: Self-pay | Admitting: Physical Therapy

## 2017-04-08 ENCOUNTER — Ambulatory Visit: Payer: Medicare Other | Admitting: Physical Therapy

## 2017-04-08 DIAGNOSIS — R262 Difficulty in walking, not elsewhere classified: Secondary | ICD-10-CM

## 2017-04-08 DIAGNOSIS — M6281 Muscle weakness (generalized): Secondary | ICD-10-CM | POA: Diagnosis not present

## 2017-04-08 DIAGNOSIS — R296 Repeated falls: Secondary | ICD-10-CM | POA: Diagnosis not present

## 2017-04-08 NOTE — Therapy (Signed)
[place], [place], [address] Phone: [phone]   Fax:  [phone]  Physical Therapy Treatment  Patient Details  Name: [name] MRN: [id number] Date of Birth: 03-Jun-1951 Referring Provider: Ellouise Newer  Encounter Date: 04/08/2017      PT End of Session - 04/08/17 1507    Visit Number 9   Date for PT Re-Evaluation 04/29/17   PT Start Time 4970   PT Stop Time 1512   PT Time Calculation (min) 33 min   Activity Tolerance Patient tolerated treatment well;Patient limited by fatigue   Behavior During Therapy [name] for tasks assessed/performed      Past Medical History:  Diagnosis Date  . Allergic rhinitis, cause unspecified 02/24/2014  . Anemia 06/16/2011  . BENIGN PROSTATIC HYPERTROPHY 10/14/2009  . CAD, NATIVE VESSEL 02/06/2009   saw Dr. Missy Sabins last jan 2013  . Cervical radiculopathy, chronic 02/23/2016   Right c5-6 by NCS/EMG  . CHEST PAIN 03/29/2010  . COLONIC POLYPS, HX OF 10/14/2009  . CONGESTIVE HEART FAILURE 06/18/2007  . Dementia [id number]  . DEPRESSION 10/14/2009  . Depression 09/24/2015  . DIABETES MELLITUS, TYPE II 02/01/2010  . DIASTOLIC HEART FAILURE, CHRONIC 02/06/2009  . DIZZINESS 07/17/2010  . DYSLIPIDEMIA 06/18/2007  . DYSPNEA 10/29/2008  . ESRD (end stage renal disease) on dialysis ([place]) 08/04/2010   "TTS;  " (04/18/2015)  . FOOT PAIN 08/12/2008  . GAIT DISTURBANCE 03/03/2010  . GASTROENTERITIS, VIRAL 10/14/2009  . GERD 06/18/2007  . GOITER, MULTINODULAR 12/26/2007  . GOUT 06/18/2007  . GYNECOMASTIA 07/17/2010  . Hemodialysis access, fistula [facility])    Dialysis T-Th-Sa ([place]) Right upper arm fistula  . Hyperlipidemia 10/16/2011  . Hyperparathyroidism, secondary ([place]) 06/16/2011  . HYPERTENSION 06/18/2007  . Hyperthyroidism   . HYPERTHYROIDISM 02/02/2010  . Hypocalcemia 06/07/2010  . Ischemic cardiomyopathy 06/16/2011  . NECK PAIN 07/31/2010  . NSTEMI  (non-ST elevated myocardial infarction) ([place]) 08/04/2016  . ONYCHOMYCOSIS, TOENAILS 12/26/2007  . OSA on CPAP 10/16/2011  . Other malaise and fatigue 11/24/2009  . PERIPHERAL NEUROPATHY 06/18/2007  . Prostate cancer ([place])   . PULMONARY NODULE, RIGHT LOWER LOBE 06/08/2009  . Renal insufficiency   . Sleep apnea    cpap machine and o2  . TRANSAMINASES, SERUM, ELEVATED 02/01/2010  . Transfusion history    none recent  . Unspecified hypotension 01/30/2010    Past Surgical History:  Procedure Laterality Date  . ARTERIOVENOUS GRAFT PLACEMENT Right 2009   forearm/notes 02/01/2011  . AV FISTULA PLACEMENT  11/07/2011   Procedure: INSERTION OF ARTERIOVENOUS (AV) GORE-TEX GRAFT ARM;  Surgeon: Tinnie Gens, MD;  Location: [place];  Service: Vascular;  Laterality: Left;  . BACK SURGERY  1998  . BASCILIC VEIN TRANSPOSITION Right 02/27/2013   Procedure: BASCILIC VEIN TRANSPOSITION;  Surgeon: Mal Misty, MD;  Location: [place];  Service: Vascular;  Laterality: Right;  Right Basilic Vein Transposition   . CARDIAC CATHETERIZATION N/A 08/06/2016   Procedure: Left Heart Cath and Coronary Angiography;  Surgeon: Jolaine Artist, MD;  Location: [place] CV LAB;  Service: Cardiovascular;  Laterality: N/A;  . CARDIAC CATHETERIZATION N/A 08/07/2016   Procedure: Coronary/Graft Atherectomy-CSI LAD;  Surgeon: Peter M Martinique, MD;  Location: [place] CV LAB;  Service: Cardiovascular;  Laterality: N/A;  . CERVICAL SPINE SURGERY  2/09   "to repair nerve problems in my left arm"  . CHOLECYSTECTOMY    . CORONARY ANGIOPLASTY WITH STENT PLACEMENT  06/11/2008  . CORONARY ANGIOPLASTY WITH STENT PLACEMENT  06/2007   TAXUS stent to RCA/notes 01/31/2011  . ESOPHAGOGASTRODUODENOSCOPY  09/28/2011   Procedure: ESOPHAGOGASTRODUODENOSCOPY (EGD);  Surgeon: Missy Sabins, MD;  Location[facility] ENDOSCOPY;  Service: Endoscopy;  Laterality: N/A;  . ESOPHAGOGASTRODUODENOSCOPY N/A 04/07/2015   Procedure: ESOPHAGOGASTRODUODENOSCOPY (EGD);  Surgeon:  Teena Irani, MD;  Location: [facility];  Service: Endoscopy;  Laterality: N/A;  . ESOPHAGOGASTRODUODENOSCOPY N/A 04/19/2015   Procedure: ESOPHAGOGASTRODUODENOSCOPY (EGD);  Surgeon: Arta Silence, MD;  [facility];  Service: Endoscopy;  Laterality: N/A;  . FOREIGN BODY REMOVAL  09/2003   via upper endoscopy/notes 02/12/2011  . GIVENS CAPSULE STUDY  09/30/2011   Procedure: GIVENS CAPSULE STUDY;  Surgeon: Jeryl Columbia, MD;  Location: [facility] ENDOSCOPY;  Service: Endoscopy;  Laterality: N/A;  . INSERTION OF DIALYSIS CATHETER Right 2014  . INSERTION OF DIALYSIS CATHETER Left 02/11/2013   Procedure: INSERTION OF DIALYSIS CATHETER;  Surgeon: Conrad Gurdon, MD;  Location: [place];  Service: Vascular;  Laterality: Left;  Ultrasound guided  . REMOVAL OF A DIALYSIS CATHETER Right 02/11/2013   Procedure: REMOVAL OF A DIALYSIS CATHETER;  Surgeon: Conrad [place], MD;  Location: [place];  Service: Vascular;  Laterality: Right;  . SAVORY DILATION N/A 04/07/2015   Procedure: SAVORY DILATION;  Surgeon: Teena Irani, MD;  Location: [facility] ENDOSCOPY;  Service: Endoscopy;  Laterality: N/A;  . SHUNTOGRAM N/A 09/20/2011   Procedure: Earney Mallet;  Surgeon: Conrad [place], MD;  Location: [facility] CATH LAB;  Service: Cardiovascular;  Laterality: N/A;  . SVT ABLATION N/A 11/26/2016   Procedure: SVT Ablation;  Surgeon: Will Meredith Leeds, MD;  Location: [place] CV LAB;  Service: Cardiovascular;  Laterality: N/A;  . TONSILLECTOMY    . TOTAL KNEE ARTHROPLASTY Right 08/02/2015   Procedure: TOTAL KNEE ARTHROPLASTY;  Surgeon: Renette Butters, MD;  Location: [place];  Service: Orthopedics;  Laterality: Right;  . VENOGRAM N/A 01/26/2013   Procedure: VENOGRAM;  Surgeon: Angelia Mould, MD;  Location: [facility] CATH LAB;  Service: Cardiovascular;  Laterality: N/A;    There were no vitals filed for this visit.      Subjective Assessment - 04/08/17 1440    Subjective "Im doing ok"   Currently in Pain? No/denies                          [facility] Adult PT Treatment/Exercise - 04/08/17 0001      Knee/Hip Exercises: Aerobic   Nustep level 6 x 6 minutes   Other Aerobic UBE level 4 4 frd/4rev     Knee/Hip Exercises: Seated   Sit to Sand 5 reps;without UE support;2 sets;with UE support                  PT Short Term Goals - 02/27/17 1424      PT SHORT TERM GOAL #1   Title indepednent with HEP   Time 2   Period Weeks   Status New           PT Long Term Goals - 03/27/17 1553      PT LONG TERM GOAL #2   Title increase Berg balance test score to 36/56   Status Partially Met               Plan - 04/08/17 1508    Clinical Impression Statement Pt ~ 9 minutes late for today's treatment. Pt reports that he cant do much today. After aerobic warm up  pt able to do a few sit to stands. Pt ten reported that he was too tired to continue.   Rehab Potential Fair   PT Frequency 2x / week   PT Duration 8 weeks   PT Treatment/Interventions ADLs/Self Care Home Management;Gait training;Stair training;Functional mobility training;Patient/family education;Neuromuscular re-education;Balance training;Therapeutic exercise;Therapeutic activities;Manual techniques   PT Next Visit Plan exercises for strength and balance      Patient will benefit from skilled therapeutic intervention in order to improve the following deficits and impairments:  Abnormal gait, Cardiopulmonary status limiting activity, Decreased activity tolerance, Decreased balance, Decreased mobility, Decreased strength, Difficulty walking  Visit Diagnosis: Repeated falls  Muscle weakness (generalized)  Difficulty in walking, not elsewhere classified     Problem List Patient Active Problem List   Diagnosis Date Noted  . Weakness 12/27/2016  . Generalized weakness 12/27/2016  . SVT (supraventricular tachycardia) ([place]) 10/16/2016  . Abdominal pain 08/27/2016  . Abnormal findings on diagnostic imaging of  liver and biliary tract 08/27/2016  . Bloating symptom 08/27/2016  . Hematochezia 08/27/2016  . Early satiety 08/27/2016  . Eructation 08/27/2016  . Esophageal ulcer 08/27/2016  . Flatulence, eructation and gas pain 08/27/2016  . General weakness 08/27/2016  . Skin sensation disturbance 08/27/2016  . Stricture of esophagus 08/27/2016  . Alternating constipation and diarrhea 08/16/2016  . Status post coronary artery stent placement   . Abnormal EKG   . ST segment depression 08/05/2016  . PAD (peripheral artery disease) ([place]) 06/18/2016  . Transient hypotension 04/29/2016  . Cervical radiculopathy, chronic 02/23/2016  . Memory loss 12/22/2015  . Left-sided weakness 12/22/2015  . Peripheral polyneuropathy 12/22/2015  . DM (diabetes mellitus) type II controlled with renal manifestation ([place]) 10/31/2015  . Nausea and vomiting 10/31/2015  . Malignant neoplasm of prostate ([place]) 10/24/2015  . Right leg pain 09/04/2015  . Chest pain with high risk for cardiac etiology 08/09/2015  . Acute encephalopathy 08/09/2015  . S/P right total knee replacement 08/09/2015  . Primary osteoarthritis of right knee 08/05/2015  . Neuropathy due to secondary diabetes mellitus ([place]) 08/05/2015  . Arthritis of knee 08/02/2015  . Headache 06/08/2015  . Melena 04/19/2015  . Acute blood loss anemia 04/18/2015  . Hyperthyroidism 03/11/2015  . Thigh pain 12/08/2014  . Nodule of right lung 09/14/2014  . PSA elevation 08/30/2014  . ESRD on hemodialysis ([place]) 08/16/2014  . Essential hypertension 08/16/2014  . Pre-transplant evaluation for kidney transplant 08/16/2014  . Allergic rhinitis 02/24/2014  . Elevated PSA 02/24/2014  . Vertigo 10/14/2013  . Cough 06/26/2013  . Numbness 05/15/2013  . Chronic systolic heart failure ([place]) 02/09/2013  . Other complications due to renal dialysis device, implant, and graft 01/14/2013  . Right hand pain 03/28/2012  . Dysphagia 03/28/2012  . Hypogonadism, male 03/28/2012   . OSA (obstructive sleep apnea) 10/16/2011  . Hyperlipidemia 10/16/2011  . CAD (coronary artery disease) 09/28/2011  . NSTEMI (non-ST elevated myocardial infarction) ([place]) 09/28/2011  . AVM (arteriovenous malformation) 09/27/2011  . Anemia associated with acute blood loss 09/26/2011  . Anemia 06/16/2011  . Ischemic cardiomyopathy 06/16/2011  . Hyperparathyroidism, secondary ([place]) 06/16/2011  . Preventative health care 03/11/2011  . NECK PAIN 07/31/2010  . GYNECOMASTIA 07/17/2010  . DIZZINESS 07/17/2010  . GAIT DISTURBANCE 03/03/2010  . Thyrotoxicosis 02/02/2010  . Elevated levels of transaminase & lactic acid dehydrogenase 02/01/2010  . Other malaise and fatigue 11/24/2009  . Depression 10/14/2009  . BENIGN PROSTATIC HYPERTROPHY 10/14/2009  . COLONIC POLYPS, HX OF 10/14/2009  . Dementia 09/02/2009  .  PULMONARY NODULE, RIGHT LOWER LOBE 06/08/2009  . Chronic diastolic congestive heart failure ([place]) 02/06/2009  . DYSPNEA 10/29/2008  . FOOT PAIN 08/12/2008  . ONYCHOMYCOSIS, TOENAILS 12/26/2007  . GOITER, MULTINODULAR 12/26/2007  . Gout 06/18/2007  . Neuropathy ([place]) 06/18/2007  . Hypertensive heart disease with CHF (congestive heart failure) ([place]) 06/18/2007  . Gastro-esophageal reflux disease without esophagitis 06/18/2007    Scot Jun, PTA 04/08/2017, 3:09 PM  [place] [employer] [place], [place], [address] Phone: [phone]   Fax:  [phone]  Name: [name] MRN: [id number] Date of Birth: 1951-07-14

## 2017-04-09 DIAGNOSIS — N2581 Secondary hyperparathyroidism of renal origin: Secondary | ICD-10-CM | POA: Diagnosis not present

## 2017-04-09 DIAGNOSIS — D631 Anemia in chronic kidney disease: Secondary | ICD-10-CM | POA: Diagnosis not present

## 2017-04-09 DIAGNOSIS — N186 End stage renal disease: Secondary | ICD-10-CM | POA: Diagnosis not present

## 2017-04-09 DIAGNOSIS — D509 Iron deficiency anemia, unspecified: Secondary | ICD-10-CM | POA: Diagnosis not present

## 2017-04-09 DIAGNOSIS — E119 Type 2 diabetes mellitus without complications: Secondary | ICD-10-CM | POA: Diagnosis not present

## 2017-04-09 IMAGING — DX DG CHEST 2V
2 series · 2 of 2 positions shown · non-contrast
Comparison: 09/10/2015

CLINICAL DATA: Cough

EXAM:
CHEST  2 VIEW

[chest pa]
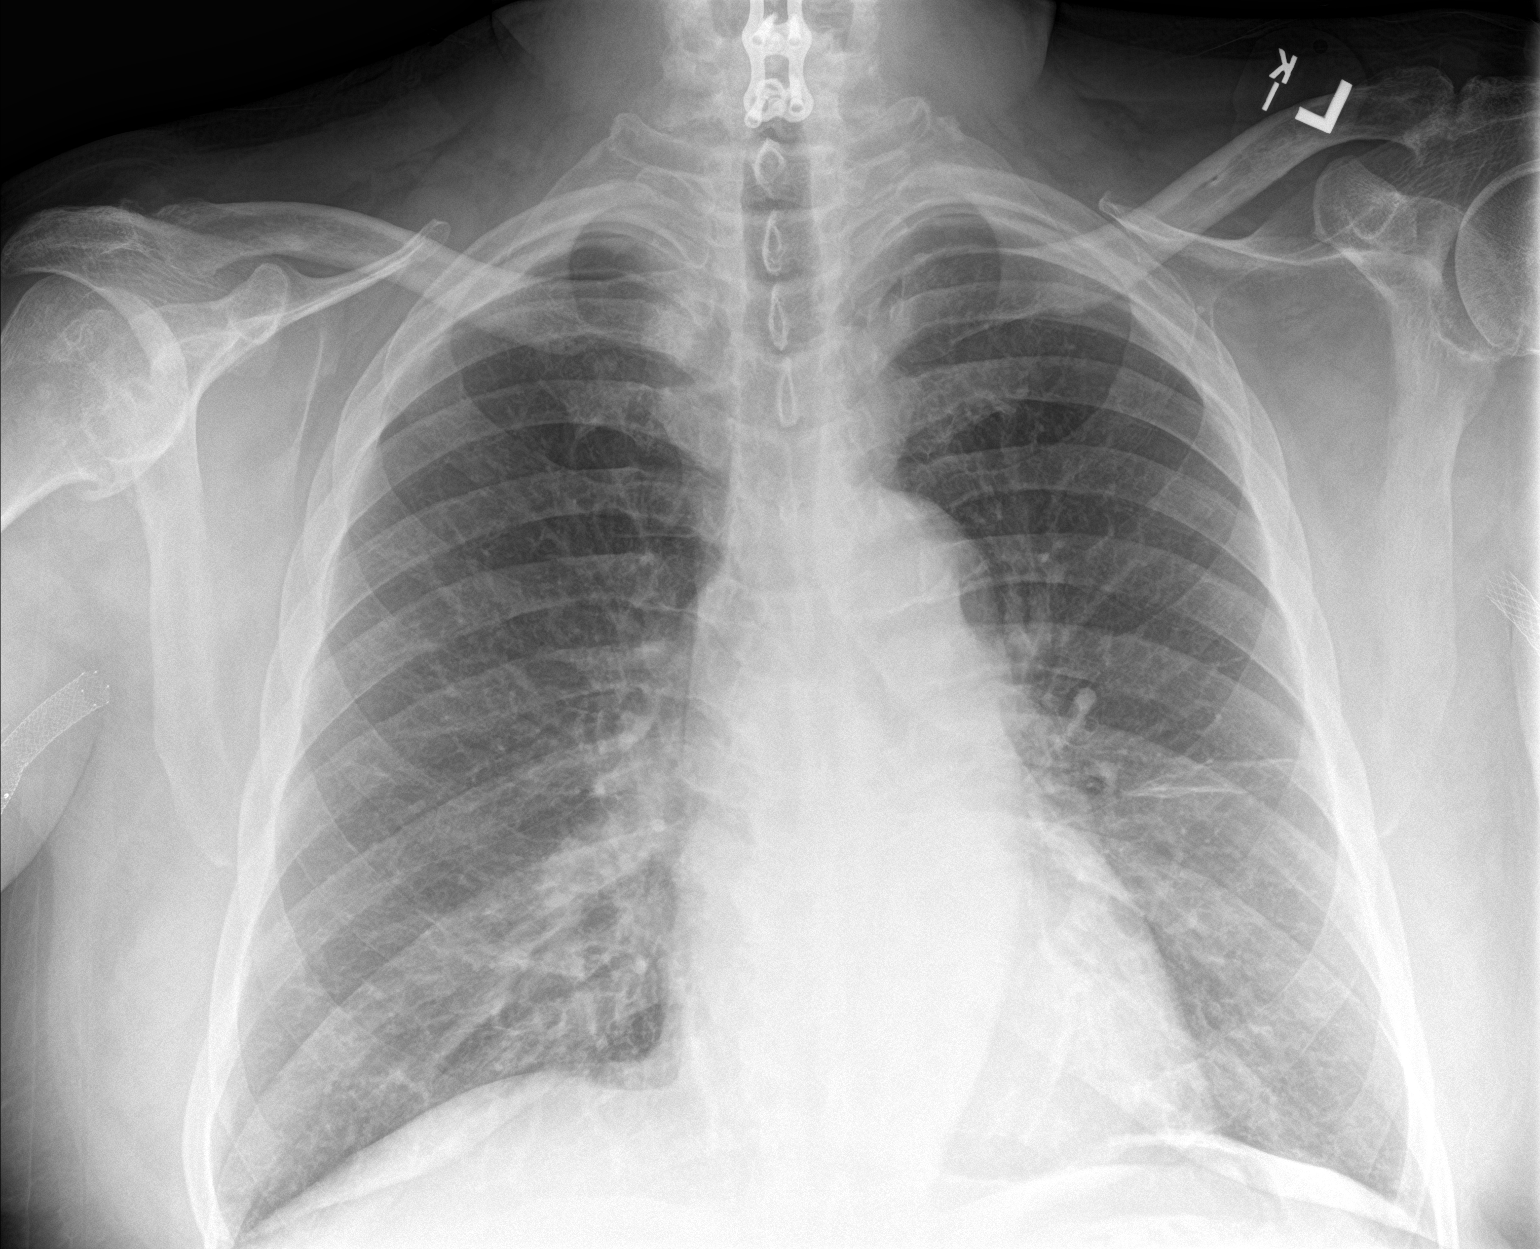

[chest lat]
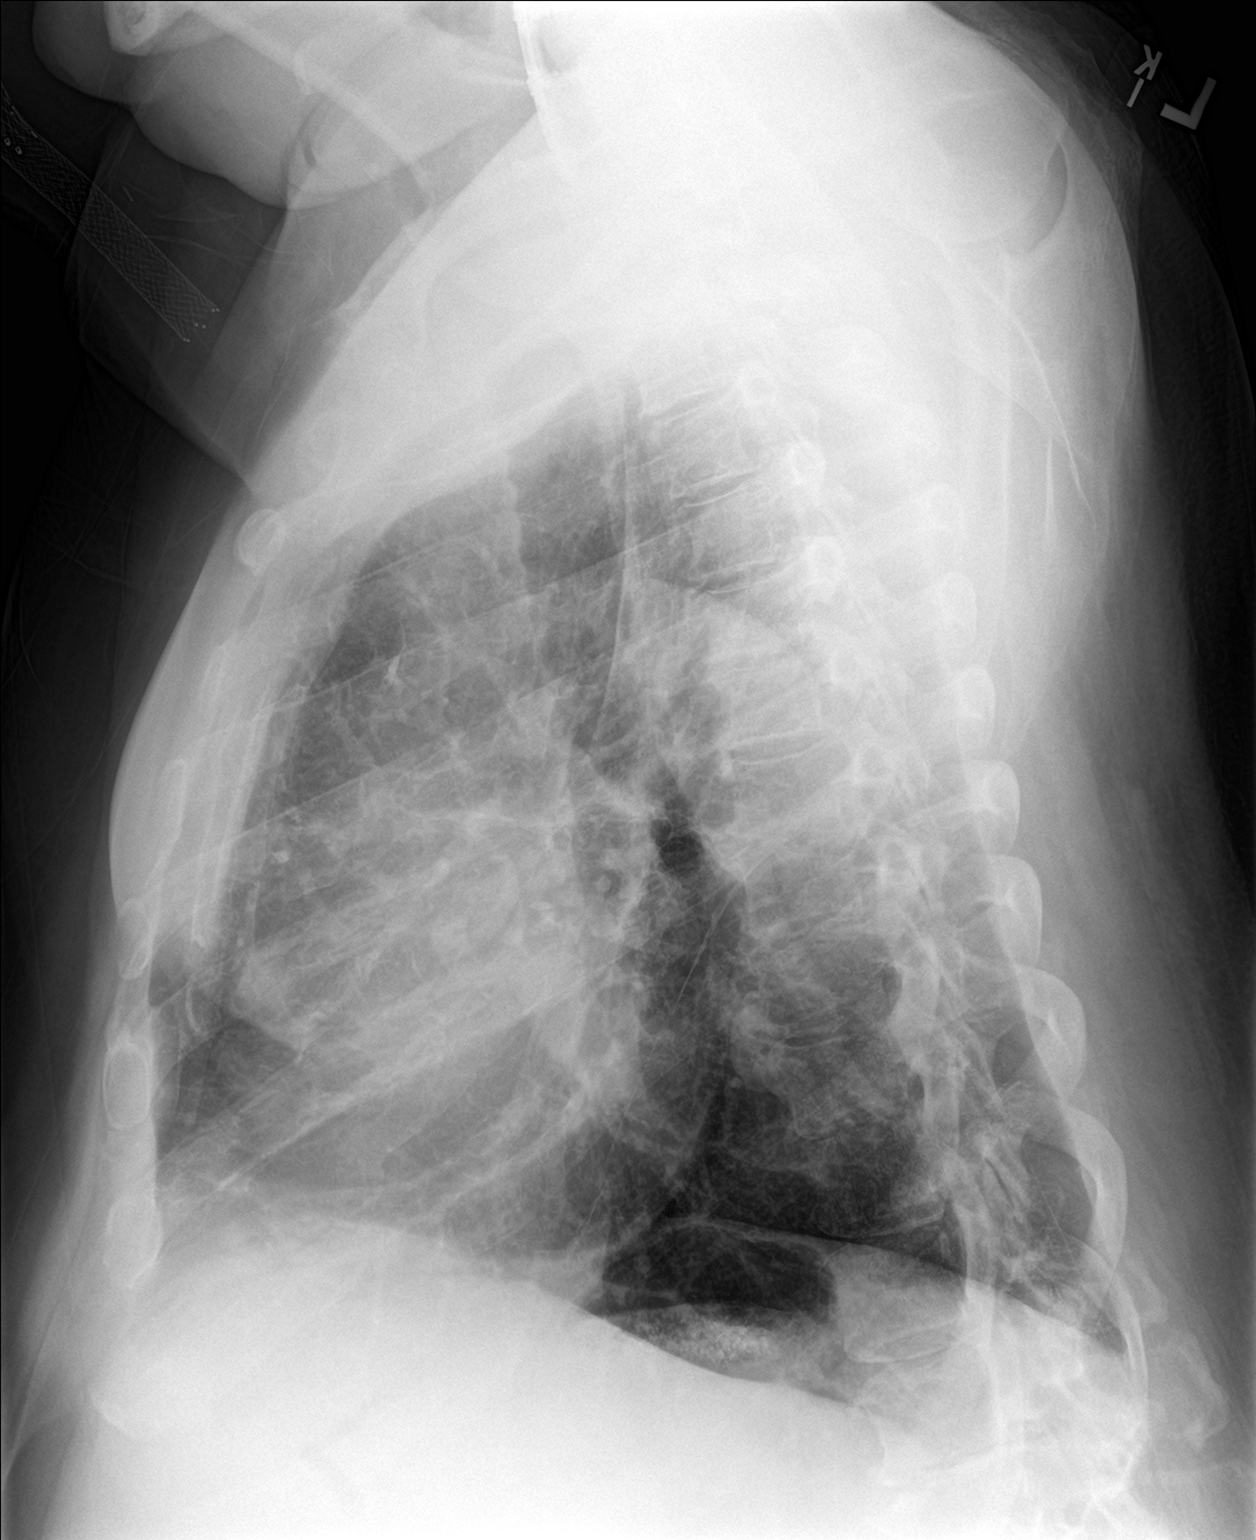

[2 of 2 positions shown; findings below may reference images not displayed]

FINDINGS: Cardiac shadow is stable. Linear scarring is again noted in the left
mid lung. No focal infiltrate or sizable effusion is seen. No acute
bony abnormality is noted. Postsurgical changes in the cervical
spine are again seen.
IMPRESSION: No acute abnormality noted.

## 2017-04-10 ENCOUNTER — Encounter: Payer: Self-pay | Admitting: Physical Therapy

## 2017-04-10 ENCOUNTER — Ambulatory Visit: Payer: Medicare Other | Admitting: Physical Therapy

## 2017-04-10 DIAGNOSIS — R262 Difficulty in walking, not elsewhere classified: Secondary | ICD-10-CM | POA: Diagnosis not present

## 2017-04-10 DIAGNOSIS — M6281 Muscle weakness (generalized): Secondary | ICD-10-CM | POA: Diagnosis not present

## 2017-04-10 DIAGNOSIS — R296 Repeated falls: Secondary | ICD-10-CM | POA: Diagnosis not present

## 2017-04-10 NOTE — Therapy (Signed)
[place], [place], [address] Phone: [phone]   Fax:  [phone]  Physical Therapy Treatment  Patient Details  Name: [name] MRN: [id number] Date of Birth: 1951-07-29 Referring Provider: Ellouise Newer  Encounter Date: 04/10/2017      PT End of Session - 04/10/17 1513    Visit Number 10   Date for PT Re-Evaluation 04/29/17   PT Start Time 1430   PT Stop Time 1515   PT Time Calculation (min) 45 min   Activity Tolerance Patient tolerated treatment well;Patient limited by fatigue   Behavior During Therapy [facility] for tasks assessed/performed      Past Medical History:  Diagnosis Date  . Allergic rhinitis, cause unspecified 02/24/2014  . Anemia 06/16/2011  . BENIGN PROSTATIC HYPERTROPHY 10/14/2009  . CAD, NATIVE VESSEL 02/06/2009   saw Dr. Missy Sabins last jan 2013  . Cervical radiculopathy, chronic 02/23/2016   Right c5-6 by NCS/EMG  . CHEST PAIN 03/29/2010  . COLONIC POLYPS, HX OF 10/14/2009  . CONGESTIVE HEART FAILURE 06/18/2007  . Dementia [id number]  . DEPRESSION 10/14/2009  . Depression 09/24/2015  . DIABETES MELLITUS, TYPE II 02/01/2010  . DIASTOLIC HEART FAILURE, CHRONIC 02/06/2009  . DIZZINESS 07/17/2010  . DYSLIPIDEMIA 06/18/2007  . DYSPNEA 10/29/2008  . ESRD (end stage renal disease) on dialysis ([place]) 08/04/2010   "TTS;  " (04/18/2015)  . FOOT PAIN 08/12/2008  . GAIT DISTURBANCE 03/03/2010  . GASTROENTERITIS, VIRAL 10/14/2009  . GERD 06/18/2007  . GOITER, MULTINODULAR 12/26/2007  . GOUT 06/18/2007  . GYNECOMASTIA 07/17/2010  . Hemodialysis access, fistula mature [facility])    Dialysis T-Th-Sa ([place]) Right upper arm fistula  . Hyperlipidemia 10/16/2011  . Hyperparathyroidism, secondary ([place]) 06/16/2011  . HYPERTENSION 06/18/2007  . Hyperthyroidism   . HYPERTHYROIDISM 02/02/2010  . Hypocalcemia 06/07/2010  . Ischemic cardiomyopathy 06/16/2011  . NECK PAIN 07/31/2010  . NSTEMI  (non-ST elevated myocardial infarction) ([place]) 08/04/2016  . ONYCHOMYCOSIS, TOENAILS 12/26/2007  . OSA on CPAP 10/16/2011  . Other malaise and fatigue 11/24/2009  . PERIPHERAL NEUROPATHY 06/18/2007  . Prostate cancer ([place])   . PULMONARY NODULE, RIGHT LOWER LOBE 06/08/2009  . Renal insufficiency   . Sleep apnea    cpap machine and o2  . TRANSAMINASES, SERUM, ELEVATED 02/01/2010  . Transfusion history    none recent  . Unspecified hypotension 01/30/2010    Past Surgical History:  Procedure Laterality Date  . ARTERIOVENOUS GRAFT PLACEMENT Right 2009   forearm/notes 02/01/2011  . AV FISTULA PLACEMENT  11/07/2011   Procedure: INSERTION OF ARTERIOVENOUS (AV) GORE-TEX GRAFT ARM;  Surgeon: Tinnie Gens, MD;  Location: [place];  Service: Vascular;  Laterality: Left;  . BACK SURGERY  1998  . BASCILIC VEIN TRANSPOSITION Right 02/27/2013   Procedure: BASCILIC VEIN TRANSPOSITION;  Surgeon: Mal Misty, MD;  Location: [place];  Service: Vascular;  Laterality: Right;  Right Basilic Vein Transposition   . CARDIAC CATHETERIZATION N/A 08/06/2016   Procedure: Left Heart Cath and Coronary Angiography;  Surgeon: Jolaine Artist, MD;  Location: [place] CV LAB;  Service: Cardiovascular;  Laterality: N/A;  . CARDIAC CATHETERIZATION N/A 08/07/2016   Procedure: Coronary/Graft Atherectomy-CSI LAD;  Surgeon: Peter M Martinique, MD;  Location: [place] CV LAB;  Service: Cardiovascular;  Laterality: N/A;  . CERVICAL SPINE SURGERY  2/09   "to repair nerve problems in my left arm"  . CHOLECYSTECTOMY    . CORONARY ANGIOPLASTY WITH STENT PLACEMENT  06/11/2008  . CORONARY ANGIOPLASTY WITH STENT PLACEMENT  06/2007   TAXUS stent to RCA/notes 01/31/2011  . ESOPHAGOGASTRODUODENOSCOPY  09/28/2011   Procedure: ESOPHAGOGASTRODUODENOSCOPY (EGD);  Surgeon: Missy Sabins, MD;  Location: [facility] ENDOSCOPY;  Service: Endoscopy;  Laterality: N/A;  . ESOPHAGOGASTRODUODENOSCOPY N/A 04/07/2015   Procedure: ESOPHAGOGASTRODUODENOSCOPY (EGD);  Surgeon:  Teena Irani, MD;  Location: [facility];  Service: Endoscopy;  Laterality: N/A;  . ESOPHAGOGASTRODUODENOSCOPY N/A 04/19/2015   Procedure: ESOPHAGOGASTRODUODENOSCOPY (EGD);  Surgeon: Arta Silence, MD;  Location: [facility] ENDOSCOPY;  Service: Endoscopy;  Laterality: N/A;  . FOREIGN BODY REMOVAL  09/2003   via upper endoscopy/notes 02/12/2011  . GIVENS CAPSULE STUDY  09/30/2011   Procedure: GIVENS CAPSULE STUDY;  Surgeon: Jeryl Columbia, MD;  Location: [facility] ENDOSCOPY;  Service: Endoscopy;  Laterality: N/A;  . INSERTION OF DIALYSIS CATHETER Right 2014  . INSERTION OF DIALYSIS CATHETER Left 02/11/2013   Procedure: INSERTION OF DIALYSIS CATHETER;  Surgeon: Conrad Merom, MD;  Location: [name];  Service: Vascular;  Laterality: Left;  Ultrasound guided  . REMOVAL OF A DIALYSIS CATHETER Right 02/11/2013   Procedure: REMOVAL OF A DIALYSIS CATHETER;  Surgeon: Conrad [place], MD;  Location: [place];  Service: Vascular;  Laterality: Right;  . SAVORY DILATION N/A 04/07/2015   Procedure: SAVORY DILATION;  Surgeon: Teena Irani, MD;  [facility] ENDOSCOPY;  Service: Endoscopy;  Laterality: N/A;  . SHUNTOGRAM N/A 09/20/2011   Procedure: Earney Mallet;  Surgeon: Conrad [place], MD;  Location: [facility] CATH LAB;  Service: Cardiovascular;  Laterality: N/A;  . SVT ABLATION N/A 11/26/2016   Procedure: SVT Ablation;  Surgeon: Will Meredith Leeds, MD;  Location: [place] CV LAB;  Service: Cardiovascular;  Laterality: N/A;  . TONSILLECTOMY    . TOTAL KNEE ARTHROPLASTY Right 08/02/2015   Procedure: TOTAL KNEE ARTHROPLASTY;  Surgeon: Renette Butters, MD;  Location: [place];  Service: Orthopedics;  Laterality: Right;  . VENOGRAM N/A 01/26/2013   Procedure: VENOGRAM;  Surgeon: Angelia Mould, MD;  Location: [facility] CATH LAB;  Service: Cardiovascular;  Laterality: N/A;    There were no vitals filed for this visit.      Subjective Assessment - 04/10/17 1431    Subjective "A little bit better today"   Currently in Pain? No/denies                          [facility] Adult PT Treatment/Exercise - 04/10/17 0001      High Level Balance   High Level Balance Activities Side stepping  in front of mat table    High Level Balance Comments standing reach with red ball 2x10' Standing march HHA x1 2x10     Lumbar Exercises: Standing   Row 15 reps;Theraband   Theraband Level (Row) Level 2 (Red)     Knee/Hip Exercises: Aerobic   Nustep level 5x 7 minutes     Knee/Hip Exercises: Machines for Strengthening   Cybex Leg Press 50lb 2x10                  PT Short Term Goals - 02/27/17 1424      PT SHORT TERM GOAL #1   Title indepednent with HEP   Time 2   Period Weeks   Status New           PT Long Term Goals - 03/27/17 1553      PT LONG TERM GOAL #2   Title increase Berg balance test score to 36/56  Status Partially Met               Plan - 04/10/17 1513    Clinical Impression Statement Pt very fatigued from standing interventions. Attempted standing weight towards end of treatment but unable to complete. Pt tends to struggle with standing exercises without UE support.   Rehab Potential Fair   PT Frequency 2x / week   PT Duration 8 weeks   PT Treatment/Interventions ADLs/Self Care Home Management;Gait training;Stair training;Functional mobility training;Patient/family education;Neuromuscular re-education;Balance training;Therapeutic exercise;Therapeutic activities;Manual techniques   PT Next Visit Plan exercises for strength and balance      Patient will benefit from skilled therapeutic intervention in order to improve the following deficits and impairments:  Abnormal gait, Cardiopulmonary status limiting activity, Decreased activity tolerance, Decreased balance, Decreased mobility, Decreased strength, Difficulty walking  Visit Diagnosis: Difficulty in walking, not elsewhere classified  Muscle weakness (generalized)  Repeated falls     Problem List Patient Active  Problem List   Diagnosis Date Noted  . Weakness 12/27/2016  . Generalized weakness 12/27/2016  . SVT (supraventricular tachycardia) ([place]) 10/16/2016  . Abdominal pain 08/27/2016  . Abnormal findings on diagnostic imaging of liver and biliary tract 08/27/2016  . Bloating symptom 08/27/2016  . Hematochezia 08/27/2016  . Early satiety 08/27/2016  . Eructation 08/27/2016  . Esophageal ulcer 08/27/2016  . Flatulence, eructation and gas pain 08/27/2016  . General weakness 08/27/2016  . Skin sensation disturbance 08/27/2016  . Stricture of esophagus 08/27/2016  . Alternating constipation and diarrhea 08/16/2016  . Status post coronary artery stent placement   . Abnormal EKG   . ST segment depression 08/05/2016  . PAD (peripheral artery disease) ([place]) 06/18/2016  . Transient hypotension 04/29/2016  . Cervical radiculopathy, chronic 02/23/2016  . Memory loss 12/22/2015  . Left-sided weakness 12/22/2015  . Peripheral polyneuropathy 12/22/2015  . DM (diabetes mellitus) type II controlled with renal manifestation ([place]) 10/31/2015  . Nausea and vomiting 10/31/2015  . Malignant neoplasm of prostate ([place]) 10/24/2015  . Right leg pain 09/04/2015  . Chest pain with high risk for cardiac etiology 08/09/2015  . Acute encephalopathy 08/09/2015  . S/P right total knee replacement 08/09/2015  . Primary osteoarthritis of right knee 08/05/2015  . Neuropathy due to secondary diabetes mellitus ([place]) 08/05/2015  . Arthritis of knee 08/02/2015  . Headache 06/08/2015  . Melena 04/19/2015  . Acute blood loss anemia 04/18/2015  . Hyperthyroidism 03/11/2015  . Thigh pain 12/08/2014  . Nodule of right lung 09/14/2014  . PSA elevation 08/30/2014  . ESRD on hemodialysis ([place]) 08/16/2014  . Essential hypertension 08/16/2014  . Pre-transplant evaluation for kidney transplant 08/16/2014  . Allergic rhinitis 02/24/2014  . Elevated PSA 02/24/2014  . Vertigo 10/14/2013  . Cough 06/26/2013  . Numbness  05/15/2013  . Chronic systolic heart failure ([place]) 02/09/2013  . Other complications due to renal dialysis device, implant, and graft 01/14/2013  . Right hand pain 03/28/2012  . Dysphagia 03/28/2012  . Hypogonadism, male 03/28/2012  . OSA (obstructive sleep apnea) 10/16/2011  . Hyperlipidemia 10/16/2011  . CAD (coronary artery disease) 09/28/2011  . NSTEMI (non-ST elevated myocardial infarction) ([place]) 09/28/2011  . AVM (arteriovenous malformation) 09/27/2011  . Anemia associated with acute blood loss 09/26/2011  . Anemia 06/16/2011  . Ischemic cardiomyopathy 06/16/2011  . Hyperparathyroidism, secondary ([place]) 06/16/2011  . Preventative health care 03/11/2011  . NECK PAIN 07/31/2010  . GYNECOMASTIA 07/17/2010  . DIZZINESS 07/17/2010  . GAIT DISTURBANCE 03/03/2010  . Thyrotoxicosis 02/02/2010  . Elevated levels  of transaminase & lactic acid dehydrogenase 02/01/2010  . Other malaise and fatigue 11/24/2009  . Depression 10/14/2009  . BENIGN PROSTATIC HYPERTROPHY 10/14/2009  . COLONIC POLYPS, HX OF 10/14/2009  . Dementia 09/02/2009  . PULMONARY NODULE, RIGHT LOWER LOBE 06/08/2009  . Chronic diastolic congestive heart failure ([place]) 02/06/2009  . DYSPNEA 10/29/2008  . FOOT PAIN 08/12/2008  . ONYCHOMYCOSIS, TOENAILS 12/26/2007  . GOITER, MULTINODULAR 12/26/2007  . Gout 06/18/2007  . Neuropathy ([place]) 06/18/2007  . Hypertensive heart disease with CHF (congestive heart failure) ([place]) 06/18/2007  . Gastro-esophageal reflux disease without esophagitis 06/18/2007    Scot Jun, PTA 04/10/2017, 3:15 PM  [place], [place], [address] Phone: [phone]   Fax:  [phone]  Name: [name] MRN: [id number] Date of Birth: 1951/01/07

## 2017-04-11 DIAGNOSIS — N2581 Secondary hyperparathyroidism of renal origin: Secondary | ICD-10-CM | POA: Diagnosis not present

## 2017-04-11 DIAGNOSIS — D631 Anemia in chronic kidney disease: Secondary | ICD-10-CM | POA: Diagnosis not present

## 2017-04-11 DIAGNOSIS — E119 Type 2 diabetes mellitus without complications: Secondary | ICD-10-CM | POA: Diagnosis not present

## 2017-04-11 DIAGNOSIS — N186 End stage renal disease: Secondary | ICD-10-CM | POA: Diagnosis not present

## 2017-04-11 DIAGNOSIS — D509 Iron deficiency anemia, unspecified: Secondary | ICD-10-CM | POA: Diagnosis not present

## 2017-04-13 DIAGNOSIS — E119 Type 2 diabetes mellitus without complications: Secondary | ICD-10-CM | POA: Diagnosis not present

## 2017-04-13 DIAGNOSIS — D509 Iron deficiency anemia, unspecified: Secondary | ICD-10-CM | POA: Diagnosis not present

## 2017-04-13 DIAGNOSIS — N186 End stage renal disease: Secondary | ICD-10-CM | POA: Diagnosis not present

## 2017-04-13 DIAGNOSIS — D631 Anemia in chronic kidney disease: Secondary | ICD-10-CM | POA: Diagnosis not present

## 2017-04-13 DIAGNOSIS — N2581 Secondary hyperparathyroidism of renal origin: Secondary | ICD-10-CM | POA: Diagnosis not present

## 2017-04-15 ENCOUNTER — Ambulatory Visit: Payer: Medicare Other | Admitting: Physical Therapy

## 2017-04-15 ENCOUNTER — Encounter: Payer: Self-pay | Admitting: Physical Therapy

## 2017-04-15 DIAGNOSIS — R296 Repeated falls: Secondary | ICD-10-CM

## 2017-04-15 DIAGNOSIS — M6281 Muscle weakness (generalized): Secondary | ICD-10-CM | POA: Diagnosis not present

## 2017-04-15 DIAGNOSIS — R262 Difficulty in walking, not elsewhere classified: Secondary | ICD-10-CM | POA: Diagnosis not present

## 2017-04-15 NOTE — Therapy (Addendum)
[place], [place], [address] Phone: [phone]   Fax:  [phone]  Physical Therapy Treatment  Patient Details  Name: [name] MRN: [id number] Date of Birth: 1951-07-28 Referring Provider: Ellouise Newer  Encounter Date: 04/15/2017      PT End of Session - 04/15/17 1514    Visit Number 11   Date for PT Re-Evaluation 04/29/17   PT Start Time 1431   PT Stop Time 1515   PT Time Calculation (min) 44 min   Activity Tolerance Patient tolerated treatment well;Patient limited by fatigue   Behavior During Therapy [facility] for tasks assessed/performed      Past Medical History:  Diagnosis Date  . Allergic rhinitis, cause unspecified 02/24/2014  . Anemia 06/16/2011  . BENIGN PROSTATIC HYPERTROPHY 10/14/2009  . CAD, NATIVE VESSEL 02/06/2009   saw Dr. Missy Sabins last jan 2013  . Cervical radiculopathy, chronic 02/23/2016   Right c5-6 by NCS/EMG  . CHEST PAIN 03/29/2010  . COLONIC POLYPS, HX OF 10/14/2009  . CONGESTIVE HEART FAILURE 06/18/2007  . Dementia [id number]  . DEPRESSION 10/14/2009  . Depression 09/24/2015  . DIABETES MELLITUS, TYPE II 02/01/2010  . DIASTOLIC HEART FAILURE, CHRONIC 02/06/2009  . DIZZINESS 07/17/2010  . DYSLIPIDEMIA 06/18/2007  . DYSPNEA 10/29/2008  . ESRD (end stage renal disease) on dialysis ([place]) 08/04/2010   "TTS;  " (04/18/2015)  . FOOT PAIN 08/12/2008  . GAIT DISTURBANCE 03/03/2010  . GASTROENTERITIS, VIRAL 10/14/2009  . GERD 06/18/2007  . GOITER, MULTINODULAR 12/26/2007  . GOUT 06/18/2007  . GYNECOMASTIA 07/17/2010  . Hemodialysis access, fistula mature [facility])    Dialysis T-Th-Sa ([place]) Right upper arm fistula  . Hyperlipidemia 10/16/2011  . Hyperparathyroidism, secondary ([place]) 06/16/2011  . HYPERTENSION 06/18/2007  . Hyperthyroidism   . HYPERTHYROIDISM 02/02/2010  . Hypocalcemia 06/07/2010  . Ischemic cardiomyopathy 06/16/2011  . NECK PAIN 07/31/2010  . NSTEMI  (non-ST elevated myocardial infarction) ([place]) 08/04/2016  . ONYCHOMYCOSIS, TOENAILS 12/26/2007  . OSA on CPAP 10/16/2011  . Other malaise and fatigue 11/24/2009  . PERIPHERAL NEUROPATHY 06/18/2007  . Prostate cancer ([place])   . PULMONARY NODULE, RIGHT LOWER LOBE 06/08/2009  . Renal insufficiency   . Sleep apnea    cpap machine and o2  . TRANSAMINASES, SERUM, ELEVATED 02/01/2010  . Transfusion history    none recent  . Unspecified hypotension 01/30/2010    Past Surgical History:  Procedure Laterality Date  . ARTERIOVENOUS GRAFT PLACEMENT Right 2009   forearm/notes 02/01/2011  . AV FISTULA PLACEMENT  11/07/2011   Procedure: INSERTION OF ARTERIOVENOUS (AV) GORE-TEX GRAFT ARM;  Surgeon: Tinnie Gens, MD;  Location: [place];  Service: Vascular;  Laterality: Left;  . BACK SURGERY  1998  . BASCILIC VEIN TRANSPOSITION Right 02/27/2013   Procedure: BASCILIC VEIN TRANSPOSITION;  Surgeon: Mal Misty, MD;  Location: [place];  Service: Vascular;  Laterality: Right;  Right Basilic Vein Transposition   . CARDIAC CATHETERIZATION N/A 08/06/2016   Procedure: Left Heart Cath and Coronary Angiography;  Surgeon: Jolaine Artist, MD;  Location: [place] CV LAB;  Service: Cardiovascular;  Laterality: N/A;  . CARDIAC CATHETERIZATION N/A 08/07/2016   Procedure: Coronary/Graft Atherectomy-CSI LAD;  Surgeon: Peter M Martinique, MD;  Location: [place] CV LAB;  Service: Cardiovascular;  Laterality: N/A;  . CERVICAL SPINE SURGERY  2/09   "to repair nerve problems in my left arm"  . CHOLECYSTECTOMY    . CORONARY ANGIOPLASTY WITH STENT PLACEMENT  06/11/2008  . CORONARY ANGIOPLASTY WITH STENT PLACEMENT  06/2007   TAXUS stent to RCA/notes 01/31/2011  . ESOPHAGOGASTRODUODENOSCOPY  09/28/2011   Procedure: ESOPHAGOGASTRODUODENOSCOPY (EGD);  Surgeon: Missy Sabins, MD;  Location: [facility] ENDOSCOPY;  Service: Endoscopy;  Laterality: N/A;  . ESOPHAGOGASTRODUODENOSCOPY N/A 04/07/2015   Procedure: ESOPHAGOGASTRODUODENOSCOPY (EGD);  Surgeon:  Teena Irani, MD;  Location[facility];  Service: Endoscopy;  Laterality: N/A;  . ESOPHAGOGASTRODUODENOSCOPY N/A 04/19/2015   Procedure: ESOPHAGOGASTRODUODENOSCOPY (EGD);  Surgeon: Arta Silence, MD;  Location: [facility] ENDOSCOPY;  Service: Endoscopy;  Laterality: N/A;  . FOREIGN BODY REMOVAL  09/2003   via upper endoscopy/notes 02/12/2011  . GIVENS CAPSULE STUDY  09/30/2011   Procedure: GIVENS CAPSULE STUDY;  Surgeon: Jeryl Columbia, MD;  Location: [facility] ENDOSCOPY;  Service: Endoscopy;  Laterality: N/A;  . INSERTION OF DIALYSIS CATHETER Right 2014  . INSERTION OF DIALYSIS CATHETER Left 02/11/2013   Procedure: INSERTION OF DIALYSIS CATHETER;  Surgeon: Conrad [place], MD;  Location: [place];  Service: Vascular;  Laterality: Left;  Ultrasound guided  . REMOVAL OF A DIALYSIS CATHETER Right 02/11/2013   Procedure: REMOVAL OF A DIALYSIS CATHETER;  Surgeon: Conrad [place], MD;  Location: [place];  Service: Vascular;  Laterality: Right;  . SAVORY DILATION N/A 04/07/2015   Procedure: SAVORY DILATION;  Surgeon: Teena Irani, MD;  Location[facility] ENDOSCOPY;  Service: Endoscopy;  Laterality: N/A;  . SHUNTOGRAM N/A 09/20/2011   Procedure: Earney Mallet;  Surgeon: Conrad [place], MD;  Location: [facility] CATH LAB;  Service: Cardiovascular;  Laterality: N/A;  . SVT ABLATION N/A 11/26/2016   Procedure: SVT Ablation;  Surgeon: Will Meredith Leeds, MD;  Location: [place] CV LAB;  Service: Cardiovascular;  Laterality: N/A;  . TONSILLECTOMY    . TOTAL KNEE ARTHROPLASTY Right 08/02/2015   Procedure: TOTAL KNEE ARTHROPLASTY;  Surgeon: Renette Butters, MD;  Location: [place];  Service: Orthopedics;  Laterality: Right;  . VENOGRAM N/A 01/26/2013   Procedure: VENOGRAM;  Surgeon: Angelia Mould, MD;  Location: [facility] CATH LAB;  Service: Cardiovascular;  Laterality: N/A;    There were no vitals filed for this visit.      Subjective Assessment - 04/15/17 1434    Subjective "I dong feel to good, I just aint got that energy"   Currently in Pain?  No/denies                         [facility] Adult PT Treatment/Exercise - 04/15/17 0001      High Level Balance   High Level Balance Activities Side stepping  front of mat able   High Level Balance Comments standing reach with red ball 2x10' Standing march HHA x1 2x10     Lumbar Exercises: Standing   Row 15 reps;Theraband   Theraband Level (Row) Level 2 (Red)     Knee/Hip Exercises: Aerobic   Nustep level 6x 7 minutes   Other Aerobic UBE level 4 41fd/3rev     Knee/Hip Exercises: Machines for Strengthening   Cybex Knee Extension 10lb, 2x10; SL 5lb x10 each   Cybex Knee Flexion 25lb 2x15                  PT Short Term Goals - 02/27/17 1424      PT SHORT TERM GOAL #1   Title indepednent with HEP   Time 2   Period Weeks   Status New           PT Long Term Goals -  03/27/17 1553      PT LONG TERM GOAL #2   Title increase Berg balance test score to 36/56   Status Partially Met               Plan - 04/15/17 1514    Clinical Impression Statement Pt continues to report fatigue entering clinic. Reports decrease energy. Difficulty with standing interventions reporting fatigue and LE weakness but no pain. Cues for posture with standing rows.   Rehab Potential Fair   PT Frequency 2x / week   PT Duration 8 weeks   PT Treatment/Interventions ADLs/Self Care Home Management;Gait training;Stair training;Functional mobility training;Patient/family education;Neuromuscular re-education;Balance training;Therapeutic exercise;Therapeutic activities;Manual techniques   PT Next Visit Plan BERG      Patient will benefit from skilled therapeutic intervention in order to improve the following deficits and impairments:  Abnormal gait, Cardiopulmonary status limiting activity, Decreased activity tolerance, Decreased balance, Decreased mobility, Decreased strength, Difficulty walking  Visit Diagnosis: Difficulty in walking, not elsewhere classified  Muscle  weakness (generalized)  Repeated falls     Problem List Patient Active Problem List   Diagnosis Date Noted  . Weakness 12/27/2016  . Generalized weakness 12/27/2016  . SVT (supraventricular tachycardia) ([place]) 10/16/2016  . Abdominal pain 08/27/2016  . Abnormal findings on diagnostic imaging of liver and biliary tract 08/27/2016  . Bloating symptom 08/27/2016  . Hematochezia 08/27/2016  . Early satiety 08/27/2016  . Eructation 08/27/2016  . Esophageal ulcer 08/27/2016  . Flatulence, eructation and gas pain 08/27/2016  . General weakness 08/27/2016  . Skin sensation disturbance 08/27/2016  . Stricture of esophagus 08/27/2016  . Alternating constipation and diarrhea 08/16/2016  . Status post coronary artery stent placement   . Abnormal EKG   . ST segment depression 08/05/2016  . PAD (peripheral artery disease) ([place]) 06/18/2016  . Transient hypotension 04/29/2016  . Cervical radiculopathy, chronic 02/23/2016  . Memory loss 12/22/2015  . Left-sided weakness 12/22/2015  . Peripheral polyneuropathy 12/22/2015  . DM (diabetes mellitus) type II controlled with renal manifestation ([employer]) 10/31/2015  . Nausea and vomiting 10/31/2015  . Malignant neoplasm of prostate ([place]) 10/24/2015  . Right leg pain 09/04/2015  . Chest pain with high risk for cardiac etiology 08/09/2015  . Acute encephalopathy 08/09/2015  . S/P right total knee replacement 08/09/2015  . Primary osteoarthritis of right knee 08/05/2015  . Neuropathy due to secondary diabetes mellitus ([place]) 08/05/2015  . Arthritis of knee 08/02/2015  . Headache 06/08/2015  . Melena 04/19/2015  . Acute blood loss anemia 04/18/2015  . Hyperthyroidism 03/11/2015  . Thigh pain 12/08/2014  . Nodule of right lung 09/14/2014  . PSA elevation 08/30/2014  . ESRD on hemodialysis ([place]) 08/16/2014  . Essential hypertension 08/16/2014  . Pre-transplant evaluation for kidney transplant 08/16/2014  . Allergic rhinitis 02/24/2014  . Elevated  PSA 02/24/2014  . Vertigo 10/14/2013  . Cough 06/26/2013  . Numbness 05/15/2013  . Chronic systolic heart failure ([place]) 02/09/2013  . Other complications due to renal dialysis device, implant, and graft 01/14/2013  . Right hand pain 03/28/2012  . Dysphagia 03/28/2012  . Hypogonadism, male 03/28/2012  . OSA (obstructive sleep apnea) 10/16/2011  . Hyperlipidemia 10/16/2011  . CAD (coronary artery disease) 09/28/2011  . NSTEMI (non-ST elevated myocardial infarction) ([place]) 09/28/2011  . AVM (arteriovenous malformation) 09/27/2011  . Anemia associated with acute blood loss 09/26/2011  . Anemia 06/16/2011  . Ischemic cardiomyopathy 06/16/2011  . Hyperparathyroidism, secondary ([place]) 06/16/2011  . Preventative health care 03/11/2011  . NECK PAIN 07/31/2010  .  GYNECOMASTIA 07/17/2010  . DIZZINESS 07/17/2010  . GAIT DISTURBANCE 03/03/2010  . Thyrotoxicosis 02/02/2010  . Elevated levels of transaminase & lactic acid dehydrogenase 02/01/2010  . Other malaise and fatigue 11/24/2009  . Depression 10/14/2009  . BENIGN PROSTATIC HYPERTROPHY 10/14/2009  . COLONIC POLYPS, HX OF 10/14/2009  . Dementia 09/02/2009  . PULMONARY NODULE, RIGHT LOWER LOBE 06/08/2009  . Chronic diastolic congestive heart failure ([place]) 02/06/2009  . DYSPNEA 10/29/2008  . FOOT PAIN 08/12/2008  . ONYCHOMYCOSIS, TOENAILS 12/26/2007  . GOITER, MULTINODULAR 12/26/2007  . Gout 06/18/2007  . Neuropathy ([place]) 06/18/2007  . Hypertensive heart disease with CHF (congestive heart failure) ([place]) 06/18/2007  . Gastro-esophageal reflux disease without esophagitis 06/18/2007    Scot Jun, PTA 04/15/2017, 3:16 PM  [place], [place], [address] Phone: [phone]   Fax:  [phone]  Name: [name] MRN: [id number] Date of Birth: 13-Feb-1951  PHYSICAL THERAPY DISCHARGE SUMMARY   Plan: Patient agrees to discharge.  Patient goals  were not met. Patient is being discharged due to a change in medical status.  ?????

## 2017-04-16 DIAGNOSIS — N186 End stage renal disease: Secondary | ICD-10-CM | POA: Diagnosis not present

## 2017-04-16 DIAGNOSIS — D631 Anemia in chronic kidney disease: Secondary | ICD-10-CM | POA: Diagnosis not present

## 2017-04-16 DIAGNOSIS — N2581 Secondary hyperparathyroidism of renal origin: Secondary | ICD-10-CM | POA: Diagnosis not present

## 2017-04-16 DIAGNOSIS — E119 Type 2 diabetes mellitus without complications: Secondary | ICD-10-CM | POA: Diagnosis not present

## 2017-04-16 DIAGNOSIS — D509 Iron deficiency anemia, unspecified: Secondary | ICD-10-CM | POA: Diagnosis not present

## 2017-04-17 ENCOUNTER — Ambulatory Visit: Payer: Medicare Other | Admitting: Physical Therapy

## 2017-04-17 ENCOUNTER — Other Ambulatory Visit: Payer: Self-pay

## 2017-04-17 NOTE — Patient Outreach (Signed)
[facility]) Care Management   04/17/2017  [name] [birth year] [id number]   Home visit completed with patient and his wife. Patient was sitting up in his chair going cleaning out table beside him.  [name] is an 66 y.o. male  Subjective: Patient stated he has not been feeling too good. He continues to complain of fatigue and pain following dialysis appointments.  Objective:   Review of Systems  Constitutional: Positive for malaise/fatigue.  Gastrointestinal: Positive for blood in stool.  Musculoskeletal:       Currently denies pain, but complains of pain all over after dialysis appointments  Neurological: Positive for weakness.    Physical Exam  Constitutional: He is oriented to person, place, and time. He appears well-developed and well-nourished.  Cardiovascular: Normal rate, regular rhythm and normal heart sounds.   Respiratory: Effort normal and breath sounds normal.  GI: Soft. Bowel sounds are normal.  Musculoskeletal: Normal range of motion.  Neurological: He is alert and oriented to person, place, and time.  Skin: Skin is warm and dry.    Encounter Medications:   Outpatient Encounter Prescriptions as of 04/17/2017  Medication Sig  . acetaminophen (TYLENOL) 325 MG tablet Take 650 mg by mouth every 6 (six) hours as needed for mild pain or headache.   . allopurinol (ZYLOPRIM) 100 MG tablet Take 1 tablet (100 mg total) by mouth daily.  [name] aspirin 81 MG tablet Take 81 mg by mouth daily.  . citalopram (CELEXA) 10 MG tablet Take 10 mg by mouth 3 (three) times a week.  . clopidogrel (PLAVIX) 75 MG tablet TAKE ONE TABLET BY MOUTH ONCE DAILY WITH  BREAKFAST  . colesevelam (WELCHOL) 625 MG tablet TAKE THREE TABLETS BY MOUTH TWICE DAILY WITH MEALS  . ezetimibe (ZETIA) 10 MG tablet TAKE ONE TABLET BY MOUTH ONCE DAILY  . gabapentin (NEURONTIN) 300 MG capsule 1-2 tabs by mouth at bedtime for sleep and pain  . HYDROcodone-acetaminophen (NORCO/VICODIN) 5-325  MG tablet Take 1 tablet by mouth every 6 (six) hours as needed for moderate pain.  . Lanthanum Carbonate 1000 MG PACK Take 3 packets by mouth 2 (two) times daily.   . methimazole (TAPAZOLE) 5 MG tablet Take 1 tablet (5 mg total) by mouth daily.  . metoprolol tartrate (LOPRESSOR) 25 MG tablet Take 1 tablet (25 mg total) by mouth 2 (two) times daily. ON NON-HD DAYS  . midodrine (PROAMATINE) 10 MG tablet Take 1 tablet (10 mg total) by mouth See admin instructions. Once a day only on dialysis days (Tues/Thurs/Sat)  . multivitamin (RENA-VIT) TABS tablet Take 1 tablet by mouth at bedtime. Reported on 10/24/2015  . pantoprazole (PROTONIX) 40 MG tablet Take 40 mg by mouth 2 (two) times daily.   . SENSIPAR 90 MG tablet Take 90 mg by mouth 2 (two) times daily.   No facility-administered encounter medications on file as of 04/17/2017.     Functional Status:   In your present state of health, do you have any difficulty performing the following activities: 02/07/2017 12/27/2016  Hearing? N N  Vision? Y N  Difficulty concentrating or making decisions? N N  Walking or climbing stairs? Y Y  Dressing or bathing? N N  Doing errands, shopping? Y N  Preparing Food and eating ? Y -  Using the Toilet? N -  In the past six months, have you accidently leaked urine? N -  Do you have problems with loss of bowel control? N -  Managing your Medications? N -  Managing your Finances? N -  Housekeeping or managing your Housekeeping? Y -  Some recent data might be hidden    Fall/Depression Screening:    Fall Risk  02/18/2017 02/07/2017 10/04/2016  Falls in the past year? Yes Yes -  Number falls in past yr: 2 or more 2 or more -  Injury with Fall? No Yes -  Risk Factor Category  - High Fall Risk -  Risk for fall due to : - History of fall(s);Impaired balance/gait;Impaired mobility History of fall(s);Impaired balance/gait;Impaired mobility  Follow up - Falls evaluation completed;Education provided;Falls prevention  discussed -   PHQ 2/9 Scores 02/07/2017 02/01/2017 10/15/2016 12/29/2015 10/24/2015 10/21/2015 01/13/2014  PHQ - 2 Score 6 1 2  0 0 0 2  PHQ- 9 Score 11 - 6 - - - -    Assessment:    Patient stated that he continues to have rectal bleeding off and on. He stated that he believes that his hemoglobin is low because he feels so weak and has no energy. He reported that when he has therapy on Monday, he was "dragging" and unable to participate as he should have. Patient stated that he does not bleed all the time and that it comes and goes and is not in large amounts. He stated he does not have bleeding noted except for when he goes to the bathroom to have a bowel movement. Sometimes, there is bright red blood in commode and other times, he only sees a little, but is not having bleeding with every BM. His fatigue has been ongoing for several years and it is hard to determine if this is related to his bleeding or related to his dialysis treatments or overall health status. RNCM provided education to patient including to call his provider (Dr. Paulita Fujita) if this problem persists or worsens. Encouraged to go the ED with bleeding that increases or does not stop. Patient verbalized understanding.  Patient stated that he is grateful for his continued therapy and that he has improved with ambulation in the home from walking with his walker to not needing it as much in the home. Patient demonstrated ambulation in the home for [facility] and appeared to continue to be unsteady with gait. He stated he is not 100% better with walking and is worried he will not get stronger enough, but is thankful he is walking a little better. He stated his main issue is that he just does not have the stamina to walk for very long. He feels that this has been a problem for him since his knee surgery last year. He stated prior to the surgery, he was ambulatory and had a garden and he has not been able to plant one this year.  Patient continues to deny any  symptoms of heart failure. He stated that he has not been feeling well so he has not been weighing daily, but they continue to weigh him at dialysis. He reported that his last weight was 234 lbs. RNCM provided education and encouragement to continue weighing daily and stressed the importance of monitoring for signs and symptoms of heart failure. Patient verbalized understanding, but stated right now, he just wants to feel better. He stated that he feels if he could just get a new kidney, he would be so much better. He stated that when they talked about a transplant years ago, they wanted him to lose weight and now, he is being denied the opportunity. He stated that the transplant nurse called him from [facility] to  talk with him and then he received a letter from them last week stating he was not eligible because he had to use a walker to get around. He stated that he does not feel this is fair and does not understand how this could keep him from getting a transplant. He stated he has been on dialysis for 9 years now and feels like it should be his turn to get a transplant. RNCM assessed if patient had considered a second opinion and he stated he had been to [facility], but they would not do a transplant because of his heart. RNCM offered to reach out to transplant team to determine what is required for eligibility and to provide a better understanding of why he is not eligible due to rationale of using a walker. Patient was agreeable and appreciative.  RNCM assessed if patient had any additional questions or concerns and he stated he cannot always remember what questions he wants to ask. RNCM provided education to patient to write down question as they arise and can call RNCM any time he needs assistance. Also encouraged patient to write list of questions before going to physician visits so he will not forget his important questions or concerns when he goes into the office. Encouraged him to use his [facility] Calendar book to  write down his questions and to take it with him to appointments. Patient verbalized understanding.  Plan: Patient to begin writing down questions for [facility] and providers and taking with him to appointments. Patient to notify provider of worsening rectal bleeding if this occurs.  RNCM to follow up with [facility] transplant RN to discuss eligibility requirements to assist patient with understanding his denial at present.  [place] R. Joyanne Eddinger, RN, BSN, [place] Management Coordinator [phone]

## 2017-04-18 DIAGNOSIS — D631 Anemia in chronic kidney disease: Secondary | ICD-10-CM | POA: Diagnosis not present

## 2017-04-18 DIAGNOSIS — N186 End stage renal disease: Secondary | ICD-10-CM | POA: Diagnosis not present

## 2017-04-18 DIAGNOSIS — D509 Iron deficiency anemia, unspecified: Secondary | ICD-10-CM | POA: Diagnosis not present

## 2017-04-18 DIAGNOSIS — N2581 Secondary hyperparathyroidism of renal origin: Secondary | ICD-10-CM | POA: Diagnosis not present

## 2017-04-18 DIAGNOSIS — E119 Type 2 diabetes mellitus without complications: Secondary | ICD-10-CM | POA: Diagnosis not present

## 2017-04-19 DIAGNOSIS — M11231 Other chondrocalcinosis, right wrist: Secondary | ICD-10-CM | POA: Diagnosis not present

## 2017-04-20 DIAGNOSIS — D631 Anemia in chronic kidney disease: Secondary | ICD-10-CM | POA: Diagnosis not present

## 2017-04-20 DIAGNOSIS — N186 End stage renal disease: Secondary | ICD-10-CM | POA: Diagnosis not present

## 2017-04-20 DIAGNOSIS — E119 Type 2 diabetes mellitus without complications: Secondary | ICD-10-CM | POA: Diagnosis not present

## 2017-04-20 DIAGNOSIS — N2581 Secondary hyperparathyroidism of renal origin: Secondary | ICD-10-CM | POA: Diagnosis not present

## 2017-04-20 DIAGNOSIS — D509 Iron deficiency anemia, unspecified: Secondary | ICD-10-CM | POA: Diagnosis not present

## 2017-04-22 ENCOUNTER — Emergency Department (HOSPITAL_COMMUNITY): Payer: Medicare Other

## 2017-04-22 ENCOUNTER — Ambulatory Visit: Payer: Medicare Other | Admitting: Physical Therapy

## 2017-04-22 ENCOUNTER — Inpatient Hospital Stay (HOSPITAL_COMMUNITY)
Admission: EM | Admit: 2017-04-22 | Discharge: 2017-04-27 | DRG: 377 | Disposition: A | Discharge status: Home / Self Care | Payer: Medicare Other | Attending: Internal Medicine | Admitting: Internal Medicine

## 2017-04-22 ENCOUNTER — Encounter (HOSPITAL_COMMUNITY): Payer: Self-pay | Admitting: Emergency Medicine

## 2017-04-22 DIAGNOSIS — E8889 Other specified metabolic disorders: Secondary | ICD-10-CM | POA: Diagnosis present

## 2017-04-22 DIAGNOSIS — E669 Obesity, unspecified: Secondary | ICD-10-CM | POA: Diagnosis present

## 2017-04-22 DIAGNOSIS — N2581 Secondary hyperparathyroidism of renal origin: Secondary | ICD-10-CM

## 2017-04-22 DIAGNOSIS — I200 Unstable angina: Secondary | ICD-10-CM

## 2017-04-22 DIAGNOSIS — E0590 Thyrotoxicosis, unspecified without thyrotoxic crisis or storm: Secondary | ICD-10-CM | POA: Diagnosis present

## 2017-04-22 DIAGNOSIS — K625 Hemorrhage of anus and rectum: Secondary | ICD-10-CM | POA: Diagnosis not present

## 2017-04-22 DIAGNOSIS — I25119 Atherosclerotic heart disease of native coronary artery with unspecified angina pectoris: Secondary | ICD-10-CM | POA: Diagnosis present

## 2017-04-22 DIAGNOSIS — R079 Chest pain, unspecified: Secondary | ICD-10-CM | POA: Diagnosis not present

## 2017-04-22 DIAGNOSIS — D631 Anemia in chronic kidney disease: Secondary | ICD-10-CM | POA: Diagnosis present

## 2017-04-22 DIAGNOSIS — I2510 Atherosclerotic heart disease of native coronary artery without angina pectoris: Secondary | ICD-10-CM | POA: Diagnosis not present

## 2017-04-22 DIAGNOSIS — Z6830 Body mass index (BMI) 30.0-30.9, adult: Secondary | ICD-10-CM | POA: Diagnosis not present

## 2017-04-22 DIAGNOSIS — G4733 Obstructive sleep apnea (adult) (pediatric): Secondary | ICD-10-CM | POA: Diagnosis not present

## 2017-04-22 DIAGNOSIS — I10 Essential (primary) hypertension: Secondary | ICD-10-CM | POA: Diagnosis not present

## 2017-04-22 DIAGNOSIS — Z87891 Personal history of nicotine dependence: Secondary | ICD-10-CM

## 2017-04-22 DIAGNOSIS — N186 End stage renal disease: Secondary | ICD-10-CM | POA: Diagnosis present

## 2017-04-22 DIAGNOSIS — Z96651 Presence of right artificial knee joint: Secondary | ICD-10-CM | POA: Diagnosis present

## 2017-04-22 DIAGNOSIS — E1151 Type 2 diabetes mellitus with diabetic peripheral angiopathy without gangrene: Secondary | ICD-10-CM | POA: Diagnosis present

## 2017-04-22 DIAGNOSIS — I5042 Chronic combined systolic (congestive) and diastolic (congestive) heart failure: Secondary | ICD-10-CM | POA: Diagnosis present

## 2017-04-22 DIAGNOSIS — K922 Gastrointestinal hemorrhage, unspecified: Principal | ICD-10-CM | POA: Diagnosis present

## 2017-04-22 DIAGNOSIS — I25118 Atherosclerotic heart disease of native coronary artery with other forms of angina pectoris: Secondary | ICD-10-CM | POA: Diagnosis not present

## 2017-04-22 DIAGNOSIS — I255 Ischemic cardiomyopathy: Secondary | ICD-10-CM | POA: Diagnosis not present

## 2017-04-22 DIAGNOSIS — K219 Gastro-esophageal reflux disease without esophagitis: Secondary | ICD-10-CM | POA: Diagnosis not present

## 2017-04-22 DIAGNOSIS — D122 Benign neoplasm of ascending colon: Secondary | ICD-10-CM | POA: Diagnosis present

## 2017-04-22 DIAGNOSIS — Z955 Presence of coronary angioplasty implant and graft: Secondary | ICD-10-CM | POA: Diagnosis not present

## 2017-04-22 DIAGNOSIS — E1340 Other specified diabetes mellitus with diabetic neuropathy, unspecified: Secondary | ICD-10-CM

## 2017-04-22 DIAGNOSIS — K921 Melena: Secondary | ICD-10-CM | POA: Diagnosis not present

## 2017-04-22 DIAGNOSIS — I252 Old myocardial infarction: Secondary | ICD-10-CM

## 2017-04-22 DIAGNOSIS — F329 Major depressive disorder, single episode, unspecified: Secondary | ICD-10-CM | POA: Diagnosis not present

## 2017-04-22 DIAGNOSIS — R233 Spontaneous ecchymoses: Secondary | ICD-10-CM | POA: Diagnosis present

## 2017-04-22 DIAGNOSIS — F0390 Unspecified dementia without behavioral disturbance: Secondary | ICD-10-CM | POA: Diagnosis not present

## 2017-04-22 DIAGNOSIS — E1122 Type 2 diabetes mellitus with diabetic chronic kidney disease: Secondary | ICD-10-CM | POA: Diagnosis present

## 2017-04-22 DIAGNOSIS — Z992 Dependence on renal dialysis: Secondary | ICD-10-CM

## 2017-04-22 DIAGNOSIS — K6289 Other specified diseases of anus and rectum: Secondary | ICD-10-CM | POA: Diagnosis not present

## 2017-04-22 DIAGNOSIS — D649 Anemia, unspecified: Secondary | ICD-10-CM | POA: Diagnosis present

## 2017-04-22 DIAGNOSIS — Z79891 Long term (current) use of opiate analgesic: Secondary | ICD-10-CM

## 2017-04-22 DIAGNOSIS — Z888 Allergy status to other drugs, medicaments and biological substances status: Secondary | ICD-10-CM

## 2017-04-22 DIAGNOSIS — K56609 Unspecified intestinal obstruction, unspecified as to partial versus complete obstruction: Secondary | ICD-10-CM | POA: Diagnosis not present

## 2017-04-22 DIAGNOSIS — C61 Malignant neoplasm of prostate: Secondary | ICD-10-CM | POA: Diagnosis present

## 2017-04-22 DIAGNOSIS — F32A Depression, unspecified: Secondary | ICD-10-CM | POA: Diagnosis present

## 2017-04-22 DIAGNOSIS — Z7982 Long term (current) use of aspirin: Secondary | ICD-10-CM

## 2017-04-22 DIAGNOSIS — K627 Radiation proctitis: Secondary | ICD-10-CM | POA: Diagnosis present

## 2017-04-22 DIAGNOSIS — Z8546 Personal history of malignant neoplasm of prostate: Secondary | ICD-10-CM

## 2017-04-22 DIAGNOSIS — K5730 Diverticulosis of large intestine without perforation or abscess without bleeding: Secondary | ICD-10-CM | POA: Diagnosis present

## 2017-04-22 DIAGNOSIS — Z9989 Dependence on other enabling machines and devices: Secondary | ICD-10-CM

## 2017-04-22 DIAGNOSIS — E1140 Type 2 diabetes mellitus with diabetic neuropathy, unspecified: Secondary | ICD-10-CM | POA: Diagnosis present

## 2017-04-22 DIAGNOSIS — Z881 Allergy status to other antibiotic agents status: Secondary | ICD-10-CM

## 2017-04-22 DIAGNOSIS — E785 Hyperlipidemia, unspecified: Secondary | ICD-10-CM | POA: Diagnosis present

## 2017-04-22 DIAGNOSIS — F419 Anxiety disorder, unspecified: Secondary | ICD-10-CM | POA: Diagnosis present

## 2017-04-22 DIAGNOSIS — E1129 Type 2 diabetes mellitus with other diabetic kidney complication: Secondary | ICD-10-CM

## 2017-04-22 DIAGNOSIS — Z833 Family history of diabetes mellitus: Secondary | ICD-10-CM

## 2017-04-22 DIAGNOSIS — I132 Hypertensive heart and chronic kidney disease with heart failure and with stage 5 chronic kidney disease, or end stage renal disease: Secondary | ICD-10-CM | POA: Diagnosis present

## 2017-04-22 DIAGNOSIS — N400 Enlarged prostate without lower urinary tract symptoms: Secondary | ICD-10-CM | POA: Diagnosis present

## 2017-04-22 DIAGNOSIS — E119 Type 2 diabetes mellitus without complications: Secondary | ICD-10-CM | POA: Diagnosis present

## 2017-04-22 DIAGNOSIS — K648 Other hemorrhoids: Secondary | ICD-10-CM | POA: Diagnosis present

## 2017-04-22 DIAGNOSIS — Z9861 Coronary angioplasty status: Secondary | ICD-10-CM

## 2017-04-22 DIAGNOSIS — Z7902 Long term (current) use of antithrombotics/antiplatelets: Secondary | ICD-10-CM

## 2017-04-22 DIAGNOSIS — I5022 Chronic systolic (congestive) heart failure: Secondary | ICD-10-CM | POA: Diagnosis present

## 2017-04-22 DIAGNOSIS — I120 Hypertensive chronic kidney disease with stage 5 chronic kidney disease or end stage renal disease: Secondary | ICD-10-CM | POA: Diagnosis not present

## 2017-04-22 DIAGNOSIS — Z923 Personal history of irradiation: Secondary | ICD-10-CM

## 2017-04-22 DIAGNOSIS — Z79899 Other long term (current) drug therapy: Secondary | ICD-10-CM

## 2017-04-22 DIAGNOSIS — Z8679 Personal history of other diseases of the circulatory system: Secondary | ICD-10-CM

## 2017-04-22 HISTORY — DX: Supraventricular tachycardia, unspecified: I47.10

## 2017-04-22 HISTORY — DX: Chronic combined systolic (congestive) and diastolic (congestive) heart failure: I50.42

## 2017-04-22 HISTORY — DX: Supraventricular tachycardia: I47.1

## 2017-04-22 LAB — CBC
HEMATOCRIT: 31.8 % — AB (ref 39.0–52.0)
Hemoglobin: 10 g/dL — ABNORMAL LOW (ref 13.0–17.0)
MCH: 29.2 pg (ref 26.0–34.0)
MCHC: 31.4 g/dL (ref 30.0–36.0)
MCV: 93 fL (ref 78.0–100.0)
PLATELETS: 243 10*3/uL (ref 150–400)
RBC: 3.42 MIL/uL — ABNORMAL LOW (ref 4.22–5.81)
RDW: 19.2 % — AB (ref 11.5–15.5)
WBC: 6.8 10*3/uL (ref 4.0–10.5)

## 2017-04-22 LAB — GLUCOSE, CAPILLARY: GLUCOSE-CAPILLARY: 149 mg/dL — AB (ref 65–99)

## 2017-04-22 LAB — BASIC METABOLIC PANEL
Anion gap: 16 — ABNORMAL HIGH (ref 5–15)
BUN: 63 mg/dL — AB (ref 6–20)
CHLORIDE: 104 mmol/L (ref 101–111)
CO2: 22 mmol/L (ref 22–32)
CREATININE: 11.2 mg/dL — AB (ref 0.61–1.24)
Calcium: 8.9 mg/dL (ref 8.9–10.3)
GFR calc Af Amer: 5 mL/min — ABNORMAL LOW (ref >60)
GFR calc non Af Amer: 4 mL/min — ABNORMAL LOW (ref >60)
Glucose, Bld: 139 mg/dL — ABNORMAL HIGH (ref 65–99)
POTASSIUM: 4.6 mmol/L (ref 3.5–5.1)
Sodium: 142 mmol/L (ref 135–145)

## 2017-04-22 LAB — HEPATIC FUNCTION PANEL
ALBUMIN: 3.5 g/dL (ref 3.5–5.0)
ALT: 19 U/L (ref 17–63)
AST: 22 U/L (ref 15–41)
Alkaline Phosphatase: 88 U/L (ref 38–126)
BILIRUBIN TOTAL: 0.6 mg/dL (ref 0.3–1.2)
Total Protein: 6.8 g/dL (ref 6.5–8.1)

## 2017-04-22 LAB — I-STAT TROPONIN, ED
TROPONIN I, POC: 0.02 ng/mL (ref 0.00–0.08)
Troponin i, poc: 0.03 ng/mL (ref 0.00–0.08)

## 2017-04-22 LAB — TYPE AND SCREEN
ABO/RH(D): O POS
ANTIBODY SCREEN: NEGATIVE

## 2017-04-22 LAB — PROTIME-INR
INR: 0.95
PROTHROMBIN TIME: 12.6 s (ref 11.4–15.2)

## 2017-04-22 LAB — APTT: APTT: 34 s (ref 24–36)

## 2017-04-22 LAB — LIPASE, BLOOD: LIPASE: 50 U/L (ref 11–51)

## 2017-04-22 MED ORDER — EZETIMIBE 10 MG PO TABS
10.0000 mg | ORAL_TABLET | Freq: Every day | ORAL | Status: DC
  Administered 2017-04-23 – 2017-04-27 (×5): 10 mg via ORAL
  Filled 2017-04-22 (×5): qty 1

## 2017-04-22 MED ORDER — ONDANSETRON HCL 4 MG PO TABS: 4.0000 mg | ORAL_TABLET | Freq: Four times a day (QID) | ORAL | Status: DC | PRN

## 2017-04-22 MED ORDER — MIDODRINE HCL 5 MG PO TABS
10.0000 mg | ORAL_TABLET | ORAL | Status: DC
  Administered 2017-04-23 – 2017-04-27 (×3): 10 mg via ORAL
  Filled 2017-04-22: qty 2

## 2017-04-22 MED ORDER — GABAPENTIN 300 MG PO CAPS
300.0000 mg | ORAL_CAPSULE | Freq: Every day | ORAL | Status: DC
  Administered 2017-04-22: 300 mg via ORAL
  Filled 2017-04-22: qty 1

## 2017-04-22 MED ORDER — SODIUM CHLORIDE 0.9% FLUSH: 3.0000 mL | INTRAVENOUS | Status: DC | PRN

## 2017-04-22 MED ORDER — ALLOPURINOL 100 MG PO TABS
100.0000 mg | ORAL_TABLET | Freq: Every day | ORAL | Status: DC
  Administered 2017-04-23 – 2017-04-27 (×5): 100 mg via ORAL
  Filled 2017-04-22 (×5): qty 1

## 2017-04-22 MED ORDER — SODIUM CHLORIDE 0.9 % IV SOLN: 250.0000 mL | INTRAVENOUS | Status: DC | PRN

## 2017-04-22 MED ORDER — SODIUM CHLORIDE 0.9% FLUSH
3.0000 mL | Freq: Two times a day (BID) | INTRAVENOUS | Status: DC
  Administered 2017-04-22 – 2017-04-27 (×4): 3 mL via INTRAVENOUS

## 2017-04-22 MED ORDER — METOPROLOL TARTRATE 25 MG PO TABS: 25.0000 mg | ORAL_TABLET | Freq: Two times a day (BID) | ORAL | Status: DC

## 2017-04-22 MED ORDER — HYDROCODONE-ACETAMINOPHEN 5-325 MG PO TABS
1.0000 | ORAL_TABLET | Freq: Four times a day (QID) | ORAL | Status: DC | PRN
  Administered 2017-04-26 (×2): 1 via ORAL
  Filled 2017-04-22 (×2): qty 1

## 2017-04-22 MED ORDER — CINACALCET HCL 30 MG PO TABS
90.0000 mg | ORAL_TABLET | Freq: Two times a day (BID) | ORAL | Status: DC
  Administered 2017-04-23: 90 mg via ORAL
  Filled 2017-04-22 (×2): qty 3

## 2017-04-22 MED ORDER — GABAPENTIN 300 MG PO CAPS
300.0000 mg | ORAL_CAPSULE | Freq: Every day | ORAL | Status: DC
  Administered 2017-04-23: 300 mg via ORAL
  Administered 2017-04-23 – 2017-04-24 (×2): 600 mg via ORAL
  Administered 2017-04-26: 300 mg via ORAL
  Administered 2017-04-26: 600 mg via ORAL
  Filled 2017-04-22: qty 2
  Filled 2017-04-22: qty 1
  Filled 2017-04-22: qty 2
  Filled 2017-04-22: qty 1
  Filled 2017-04-22 (×2): qty 2

## 2017-04-22 MED ORDER — SODIUM CHLORIDE 0.9% FLUSH
3.0000 mL | Freq: Two times a day (BID) | INTRAVENOUS | Status: DC
  Administered 2017-04-23 – 2017-04-25 (×3): 3 mL via INTRAVENOUS

## 2017-04-22 MED ORDER — LANTHANUM CARBONATE 500 MG PO CHEW
3000.0000 mg | CHEWABLE_TABLET | Freq: Two times a day (BID) | ORAL | Status: DC
  Administered 2017-04-23: 3000 mg via ORAL
  Filled 2017-04-22 (×4): qty 6

## 2017-04-22 MED ORDER — HYDRALAZINE HCL 20 MG/ML IJ SOLN: 10.0000 mg | INTRAMUSCULAR | Status: DC | PRN

## 2017-04-22 MED ORDER — COLESEVELAM HCL 625 MG PO TABS
625.0000 mg | ORAL_TABLET | Freq: Two times a day (BID) | ORAL | Status: DC
  Administered 2017-04-23 – 2017-04-26 (×6): 625 mg via ORAL
  Filled 2017-04-22 (×11): qty 1

## 2017-04-22 MED ORDER — METHIMAZOLE 10 MG PO TABS
5.0000 mg | ORAL_TABLET | Freq: Every day | ORAL | Status: DC
  Administered 2017-04-23 – 2017-04-27 (×5): 5 mg via ORAL
  Filled 2017-04-22 (×7): qty 1

## 2017-04-22 MED ORDER — ACETAMINOPHEN 325 MG PO TABS: 650.0000 mg | ORAL_TABLET | Freq: Four times a day (QID) | ORAL | Status: DC | PRN

## 2017-04-22 MED ORDER — ACETAMINOPHEN 325 MG PO TABS
650.0000 mg | ORAL_TABLET | Freq: Four times a day (QID) | ORAL | Status: DC | PRN
  Filled 2017-04-22: qty 2

## 2017-04-22 MED ORDER — CITALOPRAM HYDROBROMIDE 10 MG PO TABS
10.0000 mg | ORAL_TABLET | ORAL | Status: DC
  Administered 2017-04-24: 10 mg via ORAL
  Filled 2017-04-22: qty 1

## 2017-04-22 MED ORDER — ACETAMINOPHEN 650 MG RE SUPP: 650.0000 mg | Freq: Four times a day (QID) | RECTAL | Status: DC | PRN

## 2017-04-22 MED ORDER — ONDANSETRON HCL 4 MG/2ML IJ SOLN
4.0000 mg | Freq: Four times a day (QID) | INTRAMUSCULAR | Status: DC | PRN
  Administered 2017-04-25: 4 mg via INTRAVENOUS
  Filled 2017-04-22: qty 2

## 2017-04-22 MED ORDER — BARIUM SULFATE 2.1 % PO SUSP
ORAL | Status: AC
  Filled 2017-04-22: qty 2

## 2017-04-22 MED ORDER — RENA-VITE PO TABS
1.0000 | ORAL_TABLET | Freq: Every day | ORAL | Status: DC
  Administered 2017-04-23 – 2017-04-26 (×4): 1 via ORAL
  Filled 2017-04-22 (×4): qty 1

## 2017-04-22 MED ORDER — PANTOPRAZOLE SODIUM 40 MG PO TBEC
40.0000 mg | DELAYED_RELEASE_TABLET | Freq: Two times a day (BID) | ORAL | Status: DC
  Administered 2017-04-22 – 2017-04-27 (×9): 40 mg via ORAL
  Filled 2017-04-22 (×9): qty 1

## 2017-04-22 NOTE — ED Notes (Signed)
MD at bedside. 

## 2017-04-22 NOTE — ED Provider Notes (Signed)
[facility] DEPT Provider Note   CSN: [id number] Arrival date & time: 04/22/17  1226     History   Chief Complaint Chief Complaint  Patient presents with  . Rectal Bleeding  . Chest Pain  . Shortness of Breath  . Weakness    HPI [name] is a 66 y.o. male.  HPI Patient has history of chronic rectal bleeding for which he is followed by Dr. Paulita Fujita. States the rectal bleeding has been worse over the last 3 days. Not necessarily associated with bowel movements. Denies any abdominal pain or rectal pain. No fever or chills. Call Dr. Erlinda Hong office and was advised to come to the emergency department. Patient also states he's had episodic central chest tightness but currently denies any. He has some dyspnea with exertion. Patient is on hemodialysis Tuesday, Thursday, Saturday. Last dialyzed Saturday. Past Medical History:  Diagnosis Date  . Allergic rhinitis, cause unspecified 02/24/2014  . Anemia 06/16/2011  . BENIGN PROSTATIC HYPERTROPHY 10/14/2009  . CAD, NATIVE VESSEL 02/06/2009   a. 06/2007 s/p Taxus DES to the RCA;  b. 08/2016 NSTEMI in setting of SVT/PCI: LM 30ost, LAD 60m (3.0x16 Synergy DES), LCX 48m, OM1 60, RI 40, RCA 70p/m, 49m - not amenable to PCI.  [name] Cervical radiculopathy, chronic 02/23/2016   Right c5-6 by NCS/EMG  . CHEST PAIN 03/29/2010  . Chronic combined systolic (congestive) and diastolic (congestive) heart failure ([place])    a. 10/2016 Echo: EF 40-45%, Gr2 DD. mildly dil LA.  [name] COLONIC POLYPS, HX OF 10/14/2009  . Dementia [id number]  . DEPRESSION 10/14/2009  . Depression 09/24/2015  . DIABETES MELLITUS, TYPE II 02/01/2010  . DIZZINESS 07/17/2010  . DYSLIPIDEMIA 06/18/2007  . ESRD (end stage renal disease) on dialysis ([place]) 08/04/2010   "TTS;  " (04/18/2015)  . FOOT PAIN 08/12/2008  . GAIT DISTURBANCE 03/03/2010  . GASTROENTERITIS, VIRAL 10/14/2009  . GERD 06/18/2007  . GOITER, MULTINODULAR 12/26/2007  . GOUT 06/18/2007  . GYNECOMASTIA 07/17/2010  . Hemodialysis  access, fistula mature [facility])    Dialysis T-Th-Sa ([place]) Right upper arm fistula  . Hyperlipidemia 10/16/2011  . Hyperparathyroidism, secondary ([place]) 06/16/2011  . HYPERTENSION 06/18/2007  . Hyperthyroidism   . Hypocalcemia 06/07/2010  . Ischemic cardiomyopathy    a. 10/2016 Echo: EF 40-45%.  [name] NECK PAIN 07/31/2010  . ONYCHOMYCOSIS, TOENAILS 12/26/2007  . OSA on CPAP 10/16/2011  . Other malaise and fatigue 11/24/2009  . PERIPHERAL NEUROPATHY 06/18/2007  . Prostate cancer ([place])   . PSVT (paroxysmal supraventricular tachycardia) ([place])    a. 93/2671 complicated by NSTEMI;  b. 11/2016 Treated w/ adenosine in ED;  c. 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  [name] PULMONARY NODULE, RIGHT LOWER LOBE 06/08/2009  . Sleep apnea    cpap machine and o2  . TRANSAMINASES, SERUM, ELEVATED 02/01/2010  . Transfusion history    none recent  . Unspecified hypotension 01/30/2010    Patient Active Problem List   Diagnosis Date Noted  . Chronic GI bleeding 04/22/2017  . Acute GI bleeding 04/22/2017  . Weakness 12/27/2016  . Generalized weakness 12/27/2016  . History of PSVT (paroxysmal supraventricular tachycardia) 10/16/2016  . Abdominal pain 08/27/2016  . Abnormal findings on diagnostic imaging of liver and biliary tract 08/27/2016  . Bloating symptom 08/27/2016  . Hematochezia 08/27/2016  . Early satiety 08/27/2016  . Eructation 08/27/2016  . Esophageal ulcer 08/27/2016  . Flatulence, eructation and gas pain 08/27/2016  . General weakness 08/27/2016  . Skin sensation disturbance 08/27/2016  .  Stricture of esophagus 08/27/2016  . Alternating constipation and diarrhea 08/16/2016  . Status post coronary artery stent placement   . ST segment depression 08/05/2016  . PAD (peripheral artery disease) ([place]) 06/18/2016  . Transient hypotension 04/29/2016  . Cervical radiculopathy, chronic 02/23/2016  . Memory loss 12/22/2015  . Left-sided weakness 12/22/2015  . Peripheral polyneuropathy 12/22/2015  . DM  (diabetes mellitus) type II controlled with renal manifestation ([place]) 10/31/2015  . Nausea and vomiting 10/31/2015  . Malignant neoplasm of prostate ([place]) 10/24/2015  . Right leg pain 09/04/2015  . Chest pain 08/09/2015  . Acute encephalopathy 08/09/2015  . S/P right total knee replacement 08/09/2015  . Primary osteoarthritis of right knee 08/05/2015  . Neuropathy due to secondary diabetes mellitus ([place]) 08/05/2015  . Arthritis of knee 08/02/2015  . Headache 06/08/2015  . Melena 04/19/2015  . Acute blood loss anemia 04/18/2015  . Hyperthyroidism 03/11/2015  . Thigh pain 12/08/2014  . Nodule of right lung 09/14/2014  . PSA elevation 08/30/2014  . ESRD on hemodialysis ([place]) 08/16/2014  . Essential hypertension 08/16/2014  . Pre-transplant evaluation for kidney transplant 08/16/2014  . Allergic rhinitis 02/24/2014  . Elevated PSA 02/24/2014  . Vertigo 10/14/2013  . Cough 06/26/2013  . Numbness 05/15/2013  . Chronic systolic heart failure ([place]) 02/09/2013  . Other complications due to renal dialysis device, implant, and graft 01/14/2013  . Right hand pain 03/28/2012  . Dysphagia 03/28/2012  . Hypogonadism, male 03/28/2012  . OSA (obstructive sleep apnea) 10/16/2011  . Hyperlipidemia 10/16/2011  . CAD S/P percutaneous coronary angioplasty 09/28/2011  . NSTEMI (non-ST elevated myocardial infarction) ([place]) 09/28/2011  . AVM (arteriovenous malformation) 09/27/2011  . Anemia associated with acute blood loss 09/26/2011  . Ischemic cardiomyopathy 06/16/2011  . Hyperparathyroidism, secondary ([place]) 06/16/2011  . Preventative health care 03/11/2011  . NECK PAIN 07/31/2010  . GYNECOMASTIA 07/17/2010  . DIZZINESS 07/17/2010  . GAIT DISTURBANCE 03/03/2010  . Thyrotoxicosis 02/02/2010  . Elevated levels of transaminase & lactic acid dehydrogenase 02/01/2010  . Other malaise and fatigue 11/24/2009  . Depression 10/14/2009  . BENIGN PROSTATIC HYPERTROPHY 10/14/2009  . COLONIC POLYPS, HX  OF 10/14/2009  . Dementia 09/02/2009  . PULMONARY NODULE, RIGHT LOWER LOBE 06/08/2009  . Chronic diastolic congestive heart failure ([place]) 02/06/2009  . DYSPNEA 10/29/2008  . FOOT PAIN 08/12/2008  . ONYCHOMYCOSIS, TOENAILS 12/26/2007  . GOITER, MULTINODULAR 12/26/2007  . Gout 06/18/2007  . Neuropathy ([place]) 06/18/2007  . Hypertensive heart disease with CHF (congestive heart failure) ([place]) 06/18/2007  . Gastro-esophageal reflux disease without esophagitis 06/18/2007    Past Surgical History:  Procedure Laterality Date  . ARTERIOVENOUS GRAFT PLACEMENT Right 2009   forearm/notes 02/01/2011  . AV FISTULA PLACEMENT  11/07/2011   Procedure: INSERTION OF ARTERIOVENOUS (AV) GORE-TEX GRAFT ARM;  Surgeon: Tinnie Gens, MD;  Location: [facility];  Service: Vascular;  Laterality: Left;  . BACK SURGERY  1998  . BASCILIC VEIN TRANSPOSITION Right 02/27/2013   Procedure: BASCILIC VEIN TRANSPOSITION;  Surgeon: Mal Misty, MD;  Location: [facility];  Service: Vascular;  Laterality: Right;  Right Basilic Vein Transposition   . CARDIAC CATHETERIZATION N/A 08/06/2016   Procedure: Left Heart Cath and Coronary Angiography;  Surgeon: Jolaine Artist, MD;  Location: [facility] CV LAB;  Service: Cardiovascular;  Laterality: N/A;  . CARDIAC CATHETERIZATION N/A 08/07/2016   Procedure: Coronary/Graft Atherectomy-CSI LAD;  Surgeon: Peter M Martinique, MD;  Location: [facility] CV LAB;  Service: Cardiovascular;  Laterality: N/A;  . CERVICAL SPINE SURGERY  2/09   "to repair nerve problems in my left arm"  . CHOLECYSTECTOMY    . CORONARY ANGIOPLASTY WITH STENT PLACEMENT  06/11/2008  . CORONARY ANGIOPLASTY WITH STENT PLACEMENT  06/2007   TAXUS stent to RCA/notes 01/31/2011  . ESOPHAGOGASTRODUODENOSCOPY  09/28/2011   Procedure: ESOPHAGOGASTRODUODENOSCOPY (EGD);  Surgeon: Missy Sabins, MD;  Location: [facility] ENDOSCOPY;  Service: Endoscopy;  Laterality: N/A;  . ESOPHAGOGASTRODUODENOSCOPY N/A 04/07/2015   Procedure: ESOPHAGOGASTRODUODENOSCOPY  (EGD);  Surgeon: Teena Irani, MD;  Location: [facility];  Service: Endoscopy;  Laterality: N/A;  . ESOPHAGOGASTRODUODENOSCOPY N/A 04/19/2015   Procedure: ESOPHAGOGASTRODUODENOSCOPY (EGD);  Surgeon: Arta Silence, MD;  Location: [facility] ENDOSCOPY;  Service: Endoscopy;  Laterality: N/A;  . FOREIGN BODY REMOVAL  09/2003   via upper endoscopy/notes 02/12/2011  . GIVENS CAPSULE STUDY  09/30/2011   Procedure: GIVENS CAPSULE STUDY;  Surgeon: Jeryl Columbia, MD;  Location: [facility] ENDOSCOPY;  Service: Endoscopy;  Laterality: N/A;  . INSERTION OF DIALYSIS CATHETER Right 2014  . INSERTION OF DIALYSIS CATHETER Left 02/11/2013   Procedure: INSERTION OF DIALYSIS CATHETER;  Surgeon: Conrad Foxburg, MD;  Location: [facility];  Service: Vascular;  Laterality: Left;  Ultrasound guided  . REMOVAL OF A DIALYSIS CATHETER Right 02/11/2013   Procedure: REMOVAL OF A DIALYSIS CATHETER;  Surgeon: Conrad Westphalia, MD;  Location: [name];  Service: Vascular;  Laterality: Right;  . SAVORY DILATION N/A 04/07/2015   Procedure: SAVORY DILATION;  Surgeon: Teena Irani, MD;  Location: [facility] ENDOSCOPY;  Service: Endoscopy;  Laterality: N/A;  . SHUNTOGRAM N/A 09/20/2011   Procedure: Earney Mallet;  Surgeon: Conrad Lake Brownwood, MD;  Location: [facility] CATH LAB;  Service: Cardiovascular;  Laterality: N/A;  . SVT ABLATION N/A 11/26/2016   Procedure: SVT Ablation;  Surgeon: Will Meredith Leeds, MD;  Location: [facility] CV LAB;  Service: Cardiovascular;  Laterality: N/A;  . TONSILLECTOMY    . TOTAL KNEE ARTHROPLASTY Right 08/02/2015   Procedure: TOTAL KNEE ARTHROPLASTY;  Surgeon: Renette Butters, MD;  Location: [facility];  Service: Orthopedics;  Laterality: Right;  . VENOGRAM N/A 01/26/2013   Procedure: VENOGRAM;  Surgeon: Angelia Mould, MD;  Location: [facility] CATH LAB;  Service: Cardiovascular;  Laterality: N/A;       Home Medications    Prior to Admission medications   Medication Sig Start Date End Date Taking? Authorizing Provider  acetaminophen (TYLENOL) 325 MG tablet  Take 650 mg by mouth every 6 (six) hours as needed for mild pain or headache.    Yes [provider]  allopurinol (ZYLOPRIM) 100 MG tablet Take 1 tablet (100 mg total) by mouth daily. 07/20/16  Yes Biagio Borg, MD  aspirin 81 MG tablet Take 81 mg by mouth daily.   Yes [provider]  citalopram (CELEXA) 10 MG tablet Take 10 mg by mouth daily as needed (anxiety).    Yes [provider]  clopidogrel (PLAVIX) 75 MG tablet TAKE ONE TABLET BY MOUTH ONCE DAILY WITH  BREAKFAST 03/05/17  Yes Bensimhon, Shaune Pascal, MD  colesevelam [facility]) 625 MG tablet TAKE THREE TABLETS BY MOUTH TWICE DAILY WITH MEALS 07/20/16  Yes Biagio Borg, MD  ezetimibe (ZETIA) 10 MG tablet TAKE ONE TABLET BY MOUTH ONCE DAILY 04/08/17  Yes Bensimhon, Shaune Pascal, MD  gabapentin (NEURONTIN) 300 MG capsule 1-2 tabs by mouth at bedtime for sleep and pain 07/20/16  Yes Biagio Borg, MD  HYDROcodone-acetaminophen (NORCO/VICODIN) 5-325 MG tablet Take 1 tablet by mouth every 6 (six) hours as needed for moderate  pain.   Yes [provider]  hydroxypropyl methylcellulose / hypromellose (ISOPTO TEARS / GONIOVISC) 2.5 % ophthalmic solution Place 1 drop into both eyes 3 (three) times daily as needed for dry eyes.   Yes [provider]  Lanthanum Carbonate 1000 MG PACK Take 3 packets by mouth 2 (two) times daily.    Yes [provider]  methimazole (TAPAZOLE) 5 MG tablet Take 1 tablet (5 mg total) by mouth daily. 10/24/16  Yes Renato Shin, MD  metoprolol tartrate (LOPRESSOR) 25 MG tablet Take 1 tablet (25 mg total) by mouth 2 (two) times daily. ON NON-HD DAYS 12/26/16  Yes Camnitz, Ocie Doyne, MD  midodrine (PROAMATINE) 10 MG tablet Take 1 tablet (10 mg total) by mouth See admin instructions. Once a day only on dialysis days (Tues/Thurs/Sat) 10/18/16  Yes Regalado, Belkys A, MD  multivitamin (RENA-VIT) TABS tablet Take 1 tablet by mouth at bedtime. Reported on 10/24/2015   Yes [provider]  pantoprazole (PROTONIX) 40 MG tablet Take 40 mg by mouth every evening.  08/31/16  Yes [provider]  SENSIPAR 90 MG tablet Take 90 mg by mouth 2 (two) times daily. 09/18/16  Yes [provider]    Family History Family History  Problem Relation Age of Onset  . Heart disease Sister   . Thyroid nodules Sister   . Heart disease Father   . Diabetes Father   . Kidney failure Father   . Hypertension Father   . Healthy Child   . Healthy Child   . Healthy Child   . Cancer Neg Hx     Social History Social History  Substance Use Topics  . Smoking status: Former Smoker    Packs/day: 1.00    Years: 25.00    Types: Cigarettes    Quit date: 10/01/2005  . Smokeless tobacco: Former Systems developer    Quit date: 10/01/2005     Comment: Quit smoking 2007 Smoked x 25 years 1/2 ppd.  . Alcohol use No     Allergies   Cephalexin; Statins; and Ciprofloxacin   Review of Systems Review of Systems  Constitutional: Positive for fatigue. Negative for chills and fever.  Respiratory: Positive for shortness of breath. Negative for cough and wheezing.   Cardiovascular: Positive for chest pain and leg swelling. Negative for palpitations.  Gastrointestinal: Positive for anal bleeding. Negative for abdominal pain, constipation, diarrhea, nausea, rectal pain and vomiting.  Genitourinary: Negative for flank pain.  Musculoskeletal: Negative for arthralgias, back pain, myalgias, neck pain and neck stiffness.  Skin: Negative for rash and wound.  Neurological: Positive for weakness (generalized). Negative for dizziness, light-headedness, numbness and headaches.  All other systems reviewed and are negative.    Physical Exam Updated Vital Signs BP 127/72 (BP Location: Left Arm)   Pulse 71   Temp 98.2 F (36.8 C) (Oral)   Resp 16   Ht 6\' 1"  (1.854 m)   Wt 104.1 kg (229 lb 8 oz) Comment: a scale  SpO2 100%   BMI 30.28 kg/m   Physical Exam  Constitutional: He is oriented to person,  place, and time. He appears well-developed and well-nourished.  HENT:  Head: Normocephalic and atraumatic.  Mouth/Throat: Oropharynx is clear and moist.  Eyes: Pupils are equal, round, and reactive to light. EOM are normal.  Neck: Normal range of motion. Neck supple.  Cardiovascular: Normal rate and regular rhythm.   Pulmonary/Chest: Effort normal and breath sounds normal.  Abdominal: Soft. Bowel sounds are normal. There is no  tenderness. There is no rebound and no guarding.  Genitourinary:  Genitourinary Comments: Gross red blood on rectal exam. No fissures or hemorrhoids appreciated. Nontender exam  Musculoskeletal: Normal range of motion. He exhibits no edema or tenderness.  Left upper extremity fistula with palpable thrill. Mild 1+ edema bilateral lower extremities.  Neurological: He is alert and oriented to person, place, and time.  Moving all extremities without focal deficit. Sensation fully intact.  Skin: Skin is warm and dry. Capillary refill takes less than 2 seconds. No rash noted. No erythema.  Psychiatric: He has a normal mood and affect. His behavior is normal.  Nursing note and vitals reviewed.    ED Treatments / Results  Labs (all labs ordered are listed, but only abnormal results are displayed) Labs Reviewed  BASIC METABOLIC PANEL - Abnormal; Notable for the following:       Result Value   Glucose, Bld 139 (*)    BUN 63 (*)    Creatinine, Ser 11.20 (*)    GFR calc non Af Amer 4 (*)    GFR calc Af Amer 5 (*)    Anion gap 16 (*)    All other components within normal limits  CBC - Abnormal; Notable for the following:    RBC 3.42 (*)    Hemoglobin 10.0 (*)    HCT 31.8 (*)    RDW 19.2 (*)    All other components within normal limits  HEPATIC FUNCTION PANEL - Abnormal; Notable for the following:    Bilirubin, Direct <0.1 (*)    All other components within normal limits  BASIC METABOLIC PANEL - Abnormal; Notable for the following:    Glucose, Bld 143 (*)    BUN  74 (*)    Creatinine, Ser 12.18 (*)    Calcium 8.4 (*)    GFR calc non Af Amer 4 (*)    GFR calc Af Amer 4 (*)    All other components within normal limits  CBC - Abnormal; Notable for the following:    RBC 3.16 (*)    Hemoglobin 9.2 (*)    HCT 29.5 (*)    RDW 19.4 (*)    All other components within normal limits  GLUCOSE, CAPILLARY - Abnormal; Notable for the following:    Glucose-Capillary 149 (*)    All other components within normal limits  CBC - Abnormal; Notable for the following:    RBC 3.17 (*)    Hemoglobin 9.3 (*)    HCT 29.0 (*)    RDW 18.8 (*)    All other components within normal limits  APTT  PROTIME-INR  LIPASE, BLOOD  CBC  I-STAT TROPONIN, ED  I-STAT TROPONIN, ED  TYPE AND SCREEN    EKG  EKG Interpretation  Date/Time:  Monday April 22 2017 12:30:29 EDT Ventricular Rate:  84 PR Interval:  190 QRS Duration: 100 QT Interval:  404 QTC Calculation: 477 R Axis:   58 Text Interpretation:  Sinus rhythm with occasional Premature ventricular complexes Nonspecific T wave abnormality Prolonged QT Abnormal ECG Confirmed by Lita Mains  MD, Lachlan Pelto ([id number]) on 04/22/2017 4:15:53 PM       Radiology No results found.  Procedures Procedures (including critical care time)  Medications Ordered in ED Medications  Barium Sulfate 2.1 % SUSP (not administered)  ezetimibe (ZETIA) tablet 10 mg (10 mg Oral Given 04/24/17 0902)  HYDROcodone-acetaminophen (NORCO/VICODIN) 5-325 MG per tablet 1 tablet (not administered)  methimazole (TAPAZOLE) tablet 5 mg (5 mg Oral Given 04/24/17 0902)  citalopram (CELEXA) tablet 10 mg (10 mg Oral Given 04/24/17 1740)  midodrine (PROAMATINE) tablet 10 mg (10 mg Oral Given 04/23/17 1342)  pantoprazole (PROTONIX) EC tablet 40 mg (40 mg Oral Given 04/24/17 0902)  allopurinol (ZYLOPRIM) tablet 100 mg (100 mg Oral Given 04/24/17 0902)  colesevelam [facility]) tablet 625 mg (625 mg Oral Given 04/24/17 1739)  multivitamin (RENA-VIT) tablet 1 tablet (1  tablet Oral Given 04/23/17 2100)  sodium chloride flush (NS) 0.9 % injection 3 mL (3 mLs Intravenous Given 04/24/17 0903)  sodium chloride flush (NS) 0.9 % injection 3 mL (3 mLs Intravenous Given 04/24/17 0903)  sodium chloride flush (NS) 0.9 % injection 3 mL (not administered)  0.9 %  sodium chloride infusion (not administered)  acetaminophen (TYLENOL) tablet 650 mg (not administered)    Or  acetaminophen (TYLENOL) suppository 650 mg (not administered)  ondansetron (ZOFRAN) tablet 4 mg (not administered)    Or  ondansetron (ZOFRAN) injection 4 mg (not administered)  hydrALAZINE (APRESOLINE) injection 10 mg (not administered)  gabapentin (NEURONTIN) capsule 300-600 mg (600 mg Oral Given 04/23/17 2257)  metoprolol tartrate (LOPRESSOR) tablet 25 mg (25 mg Oral Given 04/24/17 0927)  Darbepoetin Alfa (ARANESP) injection 150 mcg (150 mcg Intravenous Given 04/23/17 1342)  doxercalciferol ([name]) injection 6 mcg (6 mcg Intravenous Given 04/23/17 1341)  ferric gluconate (NULECIT) 62.5 mg in sodium chloride 0.9 % 100 mL IVPB (not administered)  isosorbide mononitrate (IMDUR) 24 hr tablet 15 mg (15 mg Oral Given 04/24/17 1740)  cinacalcet (SENSIPAR) tablet 90 mg (90 mg Oral Given 04/24/17 1739)  lanthanum (FOSRENOL) chewable tablet 3,000 mg (3,000 mg Oral Given 04/24/17 1739)     Initial Impression / Assessment and Plan / ED Course  I have reviewed the triage vital signs and the nursing notes.  Pertinent labs & imaging results that were available during my care of the patient were reviewed by me and considered in my medical decision making (see chart for details).     CT without acute abnormality. Discussed with Dr. Oletta Lamas. Recommends admission and gastroenterology will consult on the patient.  Final Clinical Impressions(s) / ED Diagnoses   Final diagnoses:  Rectal bleeding    New Prescriptions Current Discharge Medication List       Julianne Rice, MD 04/24/17 2115

## 2017-04-22 NOTE — ED Notes (Signed)
MD yelverton at bedside

## 2017-04-22 NOTE — ED Notes (Signed)
pts wife up to nurse first desk inquiring about how much longer patient will have to wait, pts wife reassured that we are monitoring patients labs and vitals and that we are working to get patient back as soon as possible to see a doctor. Pt verbalized understanding.

## 2017-04-22 NOTE — ED Notes (Signed)
Attempted IV stick L hand, unsuccessful.

## 2017-04-22 NOTE — ED Notes (Signed)
Patient returned from CT

## 2017-04-22 NOTE — H&P (Signed)
History and Physical    [name] PZW:[id number] DOB: 21-Jul-1951 DOA: 04/22/2017  PCP: Biagio Borg, MD   Patient coming from: Home  Chief Complaint: Hematochezia, worsened over past couple days; also notes some chest discomfort  HPI: [name] is a 66 y.o. male with medical history significant for end-stage renal disease on hemodialysis, depression, coronary artery disease, chronic GI bleeding followed by gastroenterology, and hyperthyroidism, now presenting to the emergency department for evaluation of worsening in his chronic rectal bleeding as well as some chest discomfort for the past day and a half. Patient reports a long-standing history of chronic GI bleeding for which she has been following with gastroenterology. He notes that this has worsened over the past couple days and has been more bright red. He has also noted some chest discomfort over the same interval with no appreciable alleviating or exacerbating factors, no radiation, and it is constant. Denies any abdominal pain, nausea, or vomiting. Reports some mild lightheadedness, but no syncope or near syncope.  ED Course: Upon arrival to the ED, patient is found to be afebrile, saturating well on room air, and with vital signs stable. EKG features a sinus rhythm with PVCs and nonspecific T-wave flattening and some of the lateral leads. Chest x-ray is negative for acute cardiopulmonary disease and CT of the pelvis is negative for any acute abdominopelvic abnormality. Chemistry panel is largely unremarkable for a patient with ESRD. CBC features a stable normocytic anemia with hemoglobin of 10.0. INR is within normal limits and troponin is negative twice and decreasing. Gastroenterology was consulted by the ED physician and advised for a medical admission, indicating that they would see the patient in the morning. The patient remained hemodynamically stable in the ED, has not been in any apparent respiratory distress, and will be  admitted to the telemetry unit for ongoing evaluation and management of acute on chronic GI bleeding with chest discomfort that has resolved spontaneously in the ED.  Review of Systems:  All other systems reviewed and apart from HPI, are negative.  Past Medical History:  Diagnosis Date  . Allergic rhinitis, cause unspecified 02/24/2014  . Anemia 06/16/2011  . BENIGN PROSTATIC HYPERTROPHY 10/14/2009  . CAD, NATIVE VESSEL 02/06/2009   saw Dr. Missy Sabins last jan 2013  . Cervical radiculopathy, chronic 02/23/2016   Right c5-6 by NCS/EMG  . CHEST PAIN 03/29/2010  . COLONIC POLYPS, HX OF 10/14/2009  . CONGESTIVE HEART FAILURE 06/18/2007  . Dementia [id number]  . DEPRESSION 10/14/2009  . Depression 09/24/2015  . DIABETES MELLITUS, TYPE II 02/01/2010  . DIASTOLIC HEART FAILURE, CHRONIC 02/06/2009  . DIZZINESS 07/17/2010  . DYSLIPIDEMIA 06/18/2007  . DYSPNEA 10/29/2008  . ESRD (end stage renal disease) on dialysis ([place]) 08/04/2010   "TTS;  " (04/18/2015)  . FOOT PAIN 08/12/2008  . GAIT DISTURBANCE 03/03/2010  . GASTROENTERITIS, VIRAL 10/14/2009  . GERD 06/18/2007  . GOITER, MULTINODULAR 12/26/2007  . GOUT 06/18/2007  . GYNECOMASTIA 07/17/2010  . Hemodialysis access, fistula mature [facility])    Dialysis T-Th-Sa ([place]) Right upper arm fistula  . Hyperlipidemia 10/16/2011  . Hyperparathyroidism, secondary ([place]) 06/16/2011  . HYPERTENSION 06/18/2007  . Hyperthyroidism   . HYPERTHYROIDISM 02/02/2010  . Hypocalcemia 06/07/2010  . Ischemic cardiomyopathy 06/16/2011  . NECK PAIN 07/31/2010  . NSTEMI (non-ST elevated myocardial infarction) ([place]) 08/04/2016  . ONYCHOMYCOSIS, TOENAILS 12/26/2007  . OSA on CPAP 10/16/2011  . Other malaise and fatigue 11/24/2009  . PERIPHERAL NEUROPATHY 06/18/2007  . Prostate cancer ([place])   .  PULMONARY NODULE, RIGHT LOWER LOBE 06/08/2009  . Renal insufficiency   . Sleep apnea    cpap machine and o2  . TRANSAMINASES, SERUM, ELEVATED 02/01/2010  . Transfusion history     none recent  . Unspecified hypotension 01/30/2010    Past Surgical History:  Procedure Laterality Date  . ARTERIOVENOUS GRAFT PLACEMENT Right 2009   forearm/notes 02/01/2011  . AV FISTULA PLACEMENT  11/07/2011   Procedure: INSERTION OF ARTERIOVENOUS (AV) GORE-TEX GRAFT ARM;  Surgeon: Tinnie Gens, MD;  Location: [place];  Service: Vascular;  Laterality: Left;  . BACK SURGERY  1998  . BASCILIC VEIN TRANSPOSITION Right 02/27/2013   Procedure: BASCILIC VEIN TRANSPOSITION;  Surgeon: Mal Misty, MD;  Location: [place];  Service: Vascular;  Laterality: Right;  Right Basilic Vein Transposition   . CARDIAC CATHETERIZATION N/A 08/06/2016   Procedure: Left Heart Cath and Coronary Angiography;  Surgeon: Jolaine Artist, MD;  Location: [place] CV LAB;  Service: Cardiovascular;  Laterality: N/A;  . CARDIAC CATHETERIZATION N/A 08/07/2016   Procedure: Coronary/Graft Atherectomy-CSI LAD;  Surgeon: Peter M Martinique, MD;  Location: [place] CV LAB;  Service: Cardiovascular;  Laterality: N/A;  . CERVICAL SPINE SURGERY  2/09   "to repair nerve problems in my left arm"  . CHOLECYSTECTOMY    . CORONARY ANGIOPLASTY WITH STENT PLACEMENT  06/11/2008  . CORONARY ANGIOPLASTY WITH STENT PLACEMENT  06/2007   TAXUS stent to RCA/notes 01/31/2011  . ESOPHAGOGASTRODUODENOSCOPY  09/28/2011   Procedure: ESOPHAGOGASTRODUODENOSCOPY (EGD);  Surgeon: Missy Sabins, MD;  Location[facility] ENDOSCOPY;  Service: Endoscopy;  Laterality: N/A;  . ESOPHAGOGASTRODUODENOSCOPY N/A 04/07/2015   Procedure: ESOPHAGOGASTRODUODENOSCOPY (EGD);  Surgeon: Teena Irani, MD;  Location: [facility];  Service: Endoscopy;  Laterality: N/A;  . ESOPHAGOGASTRODUODENOSCOPY N/A 04/19/2015   Procedure: ESOPHAGOGASTRODUODENOSCOPY (EGD);  Surgeon: Arta Silence, MD;  [facility] END[facility]COPY;  Service: Endoscopy;  Laterality: N/A;  . FOREIGN BODY REMOVAL  09/2003   via upper endoscopy/notes 02/12/2011  . GIVENS CAPSULE STUDY  09/30/2011   Procedure: GIVENS CAPSULE STUDY;   Surgeon: Jeryl Columbia, MD;  Location: [facility] ENDOSCOPY;  Service: Endoscopy;  Laterality: N/A;  . INSERTION OF DIALYSIS CATHETER Right 2014  . INSERTION OF DIALYSIS CATHETER Left 02/11/2013   Procedure: INSERTION OF DIALYSIS CATHETER;  Surgeon: Conrad Naytahwaush, MD;  Location: [place];  Service: Vascular;  Laterality: Left;  Ultrasound guided  . REMOVAL OF A DIALYSIS CATHETER Right 02/11/2013   Procedure: REMOVAL OF A DIALYSIS CATHETER;  Surgeon: Conrad Cresson, MD;  Location: [place];  Service: Vascular;  Laterality: Right;  . SAVORY DILATION N/A 04/07/2015   Procedure: SAVORY DILATION;  Surgeon: Teena Irani, MD;  Location: [facility] ENDOSCOPY;  Service: Endoscopy;  Laterality: N/A;  . SHUNTOGRAM N/A 09/20/2011   Procedure: Earney Mallet;  Surgeon: Conrad Venedocia, MD;  Location: [facility] CATH LAB;  Service: Cardiovascular;  Laterality: N/A;  . SVT ABLATION N/A 11/26/2016   Procedure: SVT Ablation;  Surgeon: Will Meredith Leeds, MD;  Location: [place] CV LAB;  Service: Cardiovascular;  Laterality: N/A;  . TONSILLECTOMY    . TOTAL KNEE ARTHROPLASTY Right 08/02/2015   Procedure: TOTAL KNEE ARTHROPLASTY;  Surgeon: Renette Butters, MD;  Location: [place];  Service: Orthopedics;  Laterality: Right;  . VENOGRAM N/A 01/26/2013   Procedure: VENOGRAM;  Surgeon: Angelia Mould, MD;  Location: [name] CATH LAB;  Service: Cardiovascular;  Laterality: N/A;     reports that he quit smoking about 11 years ago. His smoking use included Cigarettes.  He has a 25.00 pack-year smoking history. He quit smokeless tobacco use about 11 years ago. He reports that he does not drink alcohol or use drugs.  Allergies  Allergen Reactions  . Cephalexin Swelling and Other (See Comments)    Tongue swelling  . Statins Other (See Comments)    Weak muscles  . Ciprofloxacin Rash    Family History  Problem Relation Age of Onset  . Heart disease Sister   . Thyroid nodules Sister   . Heart disease Father   . Diabetes Father   . Kidney failure Father   .  Hypertension Father   . Healthy Child   . Healthy Child   . Healthy Child   . Cancer Neg Hx      Prior to Admission medications   Medication Sig Start Date End Date Taking? Authorizing Provider  acetaminophen (TYLENOL) 325 MG tablet Take 650 mg by mouth every 6 (six) hours as needed for mild pain or headache.     [provider]  allopurinol (ZYLOPRIM) 100 MG tablet Take 1 tablet (100 mg total) by mouth daily. 07/20/16   Biagio Borg, MD  aspirin 81 MG tablet Take 81 mg by mouth daily.    [provider]  citalopram (CELEXA) 10 MG tablet Take 10 mg by mouth 3 (three) times a week.    [provider]  clopidogrel (PLAVIX) 75 MG tablet TAKE ONE TABLET BY MOUTH ONCE DAILY WITH  BREAKFAST 03/05/17   Bensimhon, Shaune Pascal, MD  colesevelam [facility]) 625 MG tablet TAKE THREE TABLETS BY MOUTH TWICE DAILY WITH MEALS 07/20/16   Biagio Borg, MD  ezetimibe (ZETIA) 10 MG tablet TAKE ONE TABLET BY MOUTH ONCE DAILY 04/08/17   Bensimhon, Shaune Pascal, MD  gabapentin (NEURONTIN) 300 MG capsule 1-2 tabs by mouth at bedtime for sleep and pain 07/20/16   Biagio Borg, MD  HYDROcodone-acetaminophen (NORCO/VICODIN) 5-325 MG tablet Take 1 tablet by mouth every 6 (six) hours as needed for moderate pain.    [provider]  Lanthanum Carbonate 1000 MG PACK Take 3 packets by mouth 2 (two) times daily.     [provider]  methimazole (TAPAZOLE) 5 MG tablet Take 1 tablet (5 mg total) by mouth daily. 10/24/16   Renato Shin, MD  metoprolol tartrate (LOPRESSOR) 25 MG tablet Take 1 tablet (25 mg total) by mouth 2 (two) times daily. ON NON-HD DAYS 12/26/16   Camnitz, Ocie Doyne, MD  midodrine (PROAMATINE) 10 MG tablet Take 1 tablet (10 mg total) by mouth See admin instructions. Once a day only on dialysis days (Tues/Thurs/Sat) 10/18/16   Regalado, Jerald Kief A, MD  multivitamin (RENA-VIT) TABS tablet Take 1 tablet by mouth at bedtime. Reported on 10/24/2015    [provider]    pantoprazole (PROTONIX) 40 MG tablet Take 40 mg by mouth 2 (two) times daily.  08/31/16   [provider]  SENSIPAR 90 MG tablet Take 90 mg by mouth 2 (two) times daily. 09/18/16   [provider]    Physical Exam: Vitals:   04/22/17 1900 04/22/17 1915 04/22/17 1930 04/22/17 2000  BP: (!) 170/79 (!) 170/79 (!) 166/87 (!) 172/85  Pulse: 68 67 63 69  Resp: 20 15 11 15   Temp:      TempSrc:      SpO2: 99% 99% 98% 96%  Weight:      Height:          Constitutional: Not in acute distress, appears chronically-ill and  older than stated age.  Eyes: PERTLA, lids and conjunctivae normal ENMT: Mucous membranes are moist. Posterior pharynx clear of any exudate or lesions.   Neck: normal, supple, no masses, no thyromegaly Respiratory: clear to auscultation bilaterally, no wheezing, no crackles. Normal respiratory effort.  Cardiovascular: S1 & S2 heard, regular rate and rhythm. No significant JVD. Abdomen: No distension, no tenderness, no masses palpated. Bowel sounds active.  Musculoskeletal: no clubbing / cyanosis. No joint deformity upper and lower extremities.   Skin: no significant rashes, lesions, ulcers. Warm, dry, well-perfused. Neurologic: CN 2-12 grossly intact. Sensation intact, DTR normal. Strength 5/5 in all 4 limbs.  Psychiatric: Alert and oriented x 3. Calm and cooperative.     Labs on Admission: I have personally reviewed following labs and imaging studies  CBC:  Recent Labs Lab 04/22/17 1245  WBC 6.8  HGB 10.0*  HCT 31.8*  MCV 93.0  PLT 024   Basic Metabolic Panel:  Recent Labs Lab 04/22/17 1245  NA 142  K 4.6  CL 104  CO2 22  GLUCOSE 139*  BUN 63*  CREATININE 11.20*  CALCIUM 8.9   GFR: Estimated Creatinine Clearance: 8.1 mL/min (A) (by C-G formula based on SCr of 11.2 mg/dL (H)). Liver Function Tests:  Recent Labs Lab 04/22/17 1245  AST 22  ALT 19  ALKPHOS 88  BILITOT 0.6  PROT 6.8  ALBUMIN 3.5    Recent Labs Lab  04/22/17 1245  LIPASE 50   No results for input(s): AMMONIA in the last 168 hours. Coagulation Profile:  Recent Labs Lab 04/22/17 1245  INR 0.95   Cardiac Enzymes: No results for input(s): CKTOTAL, CKMB, CKMBINDEX, TROPONINI in the last 168 hours. BNP (last 3 results) No results for input(s): PROBNP in the last 8760 hours. HbA1C: No results for input(s): HGBA1C in the last 72 hours. CBG: No results for input(s): GLUCAP in the last 168 hours. Lipid Profile: No results for input(s): CHOL, HDL, LDLCALC, TRIG, CHOLHDL, LDLDIRECT in the last 72 hours. Thyroid Function Tests: No results for input(s): TSH, T4TOTAL, FREET4, T3FREE, THYROIDAB in the last 72 hours. Anemia Panel: No results for input(s): VITAMINB12, FOLATE, FERRITIN, TIBC, IRON, RETICCTPCT in the last 72 hours. Urine analysis:    Component Value Date/Time   COLORURINE LT. YELLOW 06/26/2013 1200   APPEARANCEUR CLEAR 06/26/2013 1200   LABSPEC 1.020 06/26/2013 1200   PHURINE 8.5 06/26/2013 1200   GLUCOSEU 100 06/26/2013 1200   HGBUR MODERATE 06/26/2013 1200   BILIRUBINUR NEGATIVE 06/26/2013 1200   KETONESUR NEGATIVE 06/26/2013 1200   PROTEINUR >300 (A) 01/04/2010 1032   UROBILINOGEN 0.2 06/26/2013 1200   NITRITE NEGATIVE 06/26/2013 1200   LEUKOCYTESUR TRACE 06/26/2013 1200   Sepsis Labs: @LABRCNTIP (procalcitonin:4,lacticidven:4) )No results found for this or any previous visit (from the past 240 hour(s)).   Radiological Exams on Admission: Ct Abdomen Pelvis Wo Contrast  Result Date: 04/22/2017 CLINICAL DATA:  Rectal bleeding and weakness for several weeks, initial encounter EXAM: CT ABDOMEN AND PELVIS WITHOUT CONTRAST TECHNIQUE: Multidetector CT imaging of the abdomen and pelvis was performed following the standard protocol without IV contrast. COMPARISON:  10/14/2015 FINDINGS: Lower chest: Lung bases are well aerated. Only minimal dependent atelectatic changes are seen. Some mild bronchiectasis is noted in the  right lower lobe. Hepatobiliary: Gallbladder has been surgically removed. Pneumobilia is noted likely related to prior intervention. Pancreas: Unremarkable. No pancreatic ductal dilatation or surrounding inflammatory changes. Spleen: Normal in size without focal abnormality. Adrenals/Urinary Tract: Kidneys demonstrate bilateral cystic change and atrophy consistent  with the given clinical history of end-stage renal disease. A few tiny nonobstructing renal stones are seen. The adrenal glands are unremarkable. The bladder is decompressed. Stomach/Bowel: No obstructive changes are seen. No significant inflammatory changes are noted. The appendix is within normal limits. Vascular/Lymphatic: Aortic atherosclerosis. No enlarged abdominal or pelvic lymph nodes. Reproductive: Prostate is unremarkable. Other: No abdominal wall hernia or abnormality. No abdominopelvic ascites. Musculoskeletal: Degenerative changes of lumbar spine are seen. No acute bony abnormality is noted. Postsurgical changes are also noted in the lower lumbar spine. IMPRESSION: Chronic changes similar to that seen on prior exam. No acute abnormality is identified correspond with patient's given clinical symptomatology. Electronically Signed   By: Inez Catalina M.D.   On: 04/22/2017 20:12   Dg Chest 2 View  Result Date: 04/22/2017 CLINICAL DATA:  Patient states that he is having burning feeling in chest and very weak, states he has blood coming out of his rectum, that is a dark red and bright red. Hx of hyperlipidemia, NSTEMI, prostate cancer, diabetes, hypertension. EXAM: CHEST  2 VIEW COMPARISON:  Chest x-ray dated 12/26/2016. Chest x-ray dated 08/04/2016. FINDINGS: Heart size and mediastinal contours are within normal limits. Aortic atherosclerosis. Mild scarring in the left mid lung. Lungs otherwise clear. No pleural effusion or pneumothorax seen. No acute or suspicious osseous finding. IMPRESSION: No active cardiopulmonary disease. No evidence of  pneumonia or pulmonary edema. Aortic atherosclerosis. Electronically Signed   By: Franki Cabot M.D.   On: 04/22/2017 13:20    EKG: Independently reviewed. Sinus rhythm, PVC's, non-specific T-wave flattening in some of the lateral leads.   Assessment/Plan  1. Acute on chronic GI bleeding  - Pt reports long hx of chronic GI bleeding for which he follows with GI  - Reports increased bleeding, and brighter over past few days and has developed lethargy over the same interval   - Hgb is 10.0 on admission, and stable relative to priors  - Red blood reported on DRE by ED physician  - He has remained hemodynamically stable in ED  - GI is consulting and much appreciated, will follow-up recommendations - Plan to continue his Protonix BID, hold ASA and Plavix while trending CBC   2. Chest discomfort, CAD - Chest discomfort resolved in ED without treatment - Had PCI on LAD in November '17 - Non-specific T-wave abnormality in some lateral leads is similar to priors  - Troponin wnl x2 in ED and decreasing  - Continue Lopressor; ASA and Plavix due in am, held on admission pending GI consultation and plan    3. ESRD  - No indications for urgent HD on admission  - Typically dialyzed on TTS, reports completing session on 7/21 without incident  - Nephrology contacted with request for routine inpatient HD on 7/24  - SLIV, renal diet with fluid-restricitions   4. Hyperthyroidism  - Continue methimazole    5. Depression  - Appears stable, continue Celexa    6. GERD - Non-bleeding ulcer noted at GE junction on EGD from July '16  - Managed at home with PPI BID, will continue     DVT prophylaxis: SCD's Code Status: Full  Family Communication: Wife updated at bedside Disposition Plan: Admit to telemetry Consults called: Gastroenterology Admission status: Inpatient    Vianne Bulls, MD [facility] Pager [phone]  If 7PM-7AM, please contact night-coverage [email or web address] Password  [facility]  04/22/2017, 9:07 PM

## 2017-04-22 NOTE — ED Triage Notes (Signed)
Pt. Stated, I've had rectal bleeding with some chest disconfort, SOB and some weakness for over 3-4 weeks and its getting worse and now Im light headedness and weak.  The chest was hurting since yesterday.

## 2017-04-22 NOTE — ED Notes (Signed)
IV team at bedside, will go up to [facility] when they are done

## 2017-04-23 ENCOUNTER — Encounter (HOSPITAL_COMMUNITY): Payer: Self-pay | Admitting: Nurse Practitioner

## 2017-04-23 ENCOUNTER — Telehealth: Payer: Self-pay | Admitting: Physical Therapy

## 2017-04-23 DIAGNOSIS — Z992 Dependence on renal dialysis: Secondary | ICD-10-CM

## 2017-04-23 DIAGNOSIS — I5022 Chronic systolic (congestive) heart failure: Secondary | ICD-10-CM

## 2017-04-23 DIAGNOSIS — K922 Gastrointestinal hemorrhage, unspecified: Principal | ICD-10-CM

## 2017-04-23 DIAGNOSIS — F0390 Unspecified dementia without behavioral disturbance: Secondary | ICD-10-CM

## 2017-04-23 DIAGNOSIS — N186 End stage renal disease: Secondary | ICD-10-CM

## 2017-04-23 DIAGNOSIS — G4733 Obstructive sleep apnea (adult) (pediatric): Secondary | ICD-10-CM

## 2017-04-23 DIAGNOSIS — I25118 Atherosclerotic heart disease of native coronary artery with other forms of angina pectoris: Secondary | ICD-10-CM

## 2017-04-23 LAB — CBC
HEMATOCRIT: 29.5 % — AB (ref 39.0–52.0)
HEMOGLOBIN: 9.2 g/dL — AB (ref 13.0–17.0)
MCH: 29.1 pg (ref 26.0–34.0)
MCHC: 31.2 g/dL (ref 30.0–36.0)
MCV: 93.4 fL (ref 78.0–100.0)
Platelets: 224 10*3/uL (ref 150–400)
RBC: 3.16 MIL/uL — ABNORMAL LOW (ref 4.22–5.81)
RDW: 19.4 % — AB (ref 11.5–15.5)
WBC: 6.9 10*3/uL (ref 4.0–10.5)

## 2017-04-23 LAB — BASIC METABOLIC PANEL
ANION GAP: 12 (ref 5–15)
BUN: 74 mg/dL — ABNORMAL HIGH (ref 6–20)
CO2: 25 mmol/L (ref 22–32)
Calcium: 8.4 mg/dL — ABNORMAL LOW (ref 8.9–10.3)
Chloride: 103 mmol/L (ref 101–111)
Creatinine, Ser: 12.18 mg/dL — ABNORMAL HIGH (ref 0.61–1.24)
GFR calc Af Amer: 4 mL/min — ABNORMAL LOW (ref >60)
GFR calc non Af Amer: 4 mL/min — ABNORMAL LOW (ref >60)
GLUCOSE: 143 mg/dL — AB (ref 65–99)
POTASSIUM: 4.7 mmol/L (ref 3.5–5.1)
Sodium: 140 mmol/L (ref 135–145)

## 2017-04-23 MED ORDER — DOXERCALCIFEROL 4 MCG/2ML IV SOLN
6.0000 ug | INTRAVENOUS | Status: DC
  Administered 2017-04-23 – 2017-04-27 (×3): 6 ug via INTRAVENOUS
  Filled 2017-04-23 (×2): qty 4

## 2017-04-23 MED ORDER — MIDODRINE HCL 5 MG PO TABS
ORAL_TABLET | ORAL | Status: AC
  Administered 2017-04-23: 10 mg via ORAL
  Filled 2017-04-23: qty 10

## 2017-04-23 MED ORDER — ISOSORBIDE MONONITRATE ER 30 MG PO TB24
15.0000 mg | ORAL_TABLET | ORAL | Status: DC
  Administered 2017-04-24 – 2017-04-26 (×2): 15 mg via ORAL
  Filled 2017-04-23 (×2): qty 1

## 2017-04-23 MED ORDER — DARBEPOETIN ALFA 150 MCG/0.3ML IJ SOSY
150.0000 ug | PREFILLED_SYRINGE | INTRAMUSCULAR | Status: DC
  Administered 2017-04-23: 150 ug via INTRAVENOUS

## 2017-04-23 MED ORDER — LANTHANUM CARBONATE 500 MG PO CHEW
3000.0000 mg | CHEWABLE_TABLET | Freq: Two times a day (BID) | ORAL | Status: DC
  Administered 2017-04-24 – 2017-04-26 (×5): 3000 mg via ORAL
  Filled 2017-04-23 (×8): qty 6

## 2017-04-23 MED ORDER — DARBEPOETIN ALFA 150 MCG/0.3ML IJ SOSY
PREFILLED_SYRINGE | INTRAMUSCULAR | Status: AC
  Administered 2017-04-23: 150 ug via INTRAVENOUS
  Filled 2017-04-23: qty 0.3

## 2017-04-23 MED ORDER — SODIUM CHLORIDE 0.9 % IV SOLN
62.5000 mg | INTRAVENOUS | Status: DC
  Administered 2017-04-25: 62.5 mg via INTRAVENOUS
  Filled 2017-04-23 (×3): qty 5

## 2017-04-23 MED ORDER — CINACALCET HCL 30 MG PO TABS
90.0000 mg | ORAL_TABLET | Freq: Two times a day (BID) | ORAL | Status: DC
  Administered 2017-04-24 – 2017-04-26 (×5): 90 mg via ORAL
  Filled 2017-04-23 (×5): qty 3

## 2017-04-23 MED ORDER — DOXERCALCIFEROL 4 MCG/2ML IV SOLN
INTRAVENOUS | Status: AC
  Administered 2017-04-23: 6 ug via INTRAVENOUS
  Filled 2017-04-23: qty 4

## 2017-04-23 MED ORDER — METOPROLOL TARTRATE 25 MG PO TABS
25.0000 mg | ORAL_TABLET | ORAL | Status: DC
  Administered 2017-04-24 – 2017-04-26 (×3): 25 mg via ORAL
  Filled 2017-04-23 (×3): qty 1

## 2017-04-23 NOTE — Progress Notes (Signed)
Pt refused to put the bed alarm on, discussed with the pt the importance of fall prevention safety plan with understanding. Pt stated " I will call for help if I need to". Will continue to monitor pt.

## 2017-04-23 NOTE — Procedures (Signed)
  I was present at this dialysis session, have reviewed the session itself and made  appropriate changes Kelly Splinter MD [facility] pager [phone]   04/23/2017, 12:08 PM

## 2017-04-23 NOTE — Telephone Encounter (Signed)
04/23/17 patient admitted to hospital

## 2017-04-23 NOTE — Progress Notes (Signed)
PROGRESS NOTE    SIDDH VANDEVENTER  JME:[id number]  DOB: Nov 11, 1950  DOA: 04/22/2017 PCP: Biagio Borg, MD   Brief Admission Hx: [name] is a 66 y.o. male with medical history significant for end-stage renal disease on hemodialysis, depression, coronary artery disease, chronic GI bleeding followed by gastroenterology, and hyperthyroidism, now presenting to the emergency department for evaluation of worsening in his chronic rectal bleeding as well as some chest discomfort for the past day and a half.  MDM/Assessment & Plan:   1. Acute on chronic GI bleeding  - Pt reports long hx of chronic GI bleeding for which he follows with GI  - Reports increased bleeding, and brighter over past few days and has developed lethargy over the same interval   - Hgb is 10.0 on admission, and now down to 9.2.   - Red blood reported on DRE by ED physician  - He has remained hemodynamically stable on floor - GI is consulting will follow-up recommendations - Plan to continue his Protonix BID, hold ASA and Plavix and trend CBC   2. Chest discomfort, CAD - Chest discomfort resolved in ED without treatment - Had PCI on LAD in November '17 - Non-specific T-wave abnormality in some lateral leads is similar to priors  - Troponin wnl x2 in ED and decreasing  - Continue Lopressor; ASA and Plavix held on admission pending GI consultation and plan    3. ESRD  - No indications for urgent HD on admission  - Typically dialyzed on TTS, reports completing session on 7/21 without incident  - Nephrology contacted with request for routine inpatient HD on 7/24  - SLIV, renal diet with fluid-restricitions    4. Hyperthyroidism  - Continue methimazole    5. Depression  - Appears stable, continue Celexa    6. GERD - Non-bleeding ulcer noted at GE junction on EGD from July '16  - Managed at home with PPI BID, will continue     DVT prophylaxis: SCDs Code Status: Full  Family Communication: Wife updated  at bedside Disposition Plan: Admit to telemetry Consults called: Gastroenterology, nephrology Admission status: Inpatient   Subjective: Pt reports that bleeding persists but has slowed significantly since being admitted.   Objective: Vitals:   04/22/17 2000 04/22/17 2100 04/22/17 2230 04/23/17 0517  BP: (!) 172/85 (!) 150/83 (!) 183/87 117/89  Pulse: 69  70 74  Resp: 15 11 14 16   Temp:   98.1 F (36.7 C) 97.7 F (36.5 C)  TempSrc:   Oral Oral  SpO2: 96%  100% 95%  Weight:    105.5 kg (232 lb 9.6 oz)  Height:        Intake/Output Summary (Last 24 hours) at 04/23/17 0827 Last data filed at 04/23/17 2297  Gross per 24 hour  Intake              240 ml  Output                0 ml  Net              240 ml   Filed Weights   04/22/17 1233 04/23/17 0517  Weight: 106.1 kg (234 lb) 105.5 kg (232 lb 9.6 oz)     REVIEW OF SYSTEMS  As per history otherwise all reviewed and reported negative  Exam:  General exam: awake, alert, chronically ill appearing, NAD.  Respiratory system:  No increased work of breathing. Cardiovascular system: normal S1 & S2 heard.  Gastrointestinal  system: Abdomen is nondistended, soft and nontender. Normal bowel sounds heard. Central nervous system: Alert and oriented. No focal neurological deficits. Extremities: no clubbing or cyanosis.   Data Reviewed: Basic Metabolic Panel:  Recent Labs Lab 04/22/17 1245 04/23/17 0353  NA 142 140  K 4.6 4.7  CL 104 103  CO2 22 25  GLUCOSE 139* 143*  BUN 63* 74*  CREATININE 11.20* 12.18*  CALCIUM 8.9 8.4*   Liver Function Tests:  Recent Labs Lab 04/22/17 1245  AST 22  ALT 19  ALKPHOS 88  BILITOT 0.6  PROT 6.8  ALBUMIN 3.5    Recent Labs Lab 04/22/17 1245  LIPASE 50   No results for input(s): AMMONIA in the last 168 hours. CBC:  Recent Labs Lab 04/22/17 1245 04/23/17 0353  WBC 6.8 6.9  HGB 10.0* 9.2*  HCT 31.8* 29.5*  MCV 93.0 93.4  PLT 243 224   Cardiac Enzymes: No results  for input(s): CKTOTAL, CKMB, CKMBINDEX, TROPONINI in the last 168 hours. CBG (last 3)   Recent Labs  04/22/17 2247  GLUCAP 149*   No results found for this or any previous visit (from the past 240 hour(s)).   Studies: Ct Abdomen Pelvis Wo Contrast  Result Date: 04/22/2017 CLINICAL DATA:  Rectal bleeding and weakness for several weeks, initial encounter EXAM: CT ABDOMEN AND PELVIS WITHOUT CONTRAST TECHNIQUE: Multidetector CT imaging of the abdomen and pelvis was performed following the standard protocol without IV contrast. COMPARISON:  10/14/2015 FINDINGS: Lower chest: Lung bases are well aerated. Only minimal dependent atelectatic changes are seen. Some mild bronchiectasis is noted in the right lower lobe. Hepatobiliary: Gallbladder has been surgically removed. Pneumobilia is noted likely related to prior intervention. Pancreas: Unremarkable. No pancreatic ductal dilatation or surrounding inflammatory changes. Spleen: Normal in size without focal abnormality. Adrenals/Urinary Tract: Kidneys demonstrate bilateral cystic change and atrophy consistent with the given clinical history of end-stage renal disease. A few tiny nonobstructing renal stones are seen. The adrenal glands are unremarkable. The bladder is decompressed. Stomach/Bowel: No obstructive changes are seen. No significant inflammatory changes are noted. The appendix is within normal limits. Vascular/Lymphatic: Aortic atherosclerosis. No enlarged abdominal or pelvic lymph nodes. Reproductive: Prostate is unremarkable. Other: No abdominal wall hernia or abnormality. No abdominopelvic ascites. Musculoskeletal: Degenerative changes of lumbar spine are seen. No acute bony abnormality is noted. Postsurgical changes are also noted in the lower lumbar spine. IMPRESSION: Chronic changes similar to that seen on prior exam. No acute abnormality is identified correspond with patient's given clinical symptomatology. Electronically Signed   By: Inez Catalina M.D.   On: 04/22/2017 20:12   Dg Chest 2 View  Result Date: 04/22/2017 CLINICAL DATA:  Patient states that he is having burning feeling in chest and very weak, states he has blood coming out of his rectum, that is a dark red and bright red. Hx of hyperlipidemia, NSTEMI, prostate cancer, diabetes, hypertension. EXAM: CHEST  2 VIEW COMPARISON:  Chest x-ray dated 12/26/2016. Chest x-ray dated 08/04/2016. FINDINGS: Heart size and mediastinal contours are within normal limits. Aortic atherosclerosis. Mild scarring in the left mid lung. Lungs otherwise clear. No pleural effusion or pneumothorax seen. No acute or suspicious osseous finding. IMPRESSION: No active cardiopulmonary disease. No evidence of pneumonia or pulmonary edema. Aortic atherosclerosis. Electronically Signed   By: Franki Cabot M.D.   On: 04/22/2017 13:20   Scheduled Meds: . allopurinol  100 mg Oral Daily  . cinacalcet  90 mg Oral BID WC  . [START ON 04/24/2017]  citalopram  10 mg Oral Once per day on Mon Wed Fri  . colesevelam  625 mg Oral BID WC  . ezetimibe  10 mg Oral Daily  . gabapentin  300-600 mg Oral QHS  . lanthanum  3,000 mg Oral BID WC  . methimazole  5 mg Oral Daily  . [START ON 04/24/2017] metoprolol tartrate  25 mg Oral 2 times per day on Sun Mon Wed Fri  . midodrine  10 mg Oral Q T,Th,Sa-HD  . multivitamin  1 tablet Oral QHS  . pantoprazole  40 mg Oral BID  . sodium chloride flush  3 mL Intravenous Q12H  . sodium chloride flush  3 mL Intravenous Q12H   Continuous Infusions: . sodium chloride      Principal Problem:   Acute GI bleeding Active Problems:   Dementia   Depression   Gastro-esophageal reflux disease without esophagitis   Hyperparathyroidism, secondary ([name])   CAD (coronary artery disease)   OSA (obstructive sleep apnea)   Chronic systolic heart failure ([name])   Chest pain   DM (diabetes mellitus) type II controlled with renal manifestation ([name])   Hematochezia   ESRD on hemodialysis  ([place])   Essential hypertension   Chronic GI bleeding  Time spent:   Irwin Brakeman, MD, FAAFP [facility] Pager [phone]  If 7PM-7AM, please contact night-coverage [email or web address] Password [email or web address] 04/23/2017, 8:27 AM    LOS: 1 day

## 2017-04-23 NOTE — Consult Note (Signed)
Referring Provider: [facility] Primary Care Physician:  Biagio Borg, MD Primary Gastroenterologist:  Dr.Outlaw   Reason for Consultation:  GI bleed  HPI: [name] is a 66 y.o. male personal history of end-stage renal disease on hemodialysis, history of prostate cancer status post radiation therapy,  history of coronary artery disease. STEMI in 08/2016 S/P PCI with stent placement on aspirin and Plavix, H/O AVNRT S/P ablation in 11/2016  admitted to the hospital with bright red blood per rectum. GI is consulted for further evaluation.  Patient seen and examined at bedside. He is complaining of intermittent lower GI bleed since last 2 months. Described as a bright blood along with clotted blood in the commode. Denied any abdominal pain. Denied any nausea vomiting. Denied any black tarry stool. Denied any dysphagia or odynophagia.  Previous GI workup --------------------------- - Patient was seen in the office by Dr. Paulita Fujita in June 2018 for repeat colonoscopy. Colonoscopy was on hold as patient was not able to hold Plavix until end of this year. - Multiple colonoscopy in the past. Had colonoscopy in 2004, 2005, 2010 and 2012. Colonoscopy 2010 and 2012 showed small polyps and diverticulosis. 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 -  Normal Capsule endoscopy in 2012.  Past Medical History:  Diagnosis Date  . Allergic rhinitis, cause unspecified 02/24/2014  . Anemia 06/16/2011  . BENIGN PROSTATIC HYPERTROPHY 10/14/2009  . CAD, NATIVE VESSEL 02/06/2009   saw Dr. Missy Sabins last jan 2013  . Cervical radiculopathy, chronic 02/23/2016   Right c5-6 by NCS/EMG  . CHEST PAIN 03/29/2010  . COLONIC POLYPS, HX OF 10/14/2009  . CONGESTIVE HEART FAILURE 06/18/2007  . Dementia [id number]  . DEPRESSION 10/14/2009  . Depression 09/24/2015  . DIABETES MELLITUS, TYPE II 02/01/2010  . DIASTOLIC HEART FAILURE, CHRONIC 02/06/2009  .  DIZZINESS 07/17/2010  . DYSLIPIDEMIA 06/18/2007  . DYSPNEA 10/29/2008  . ESRD (end stage renal disease) on dialysis ([place]) 08/04/2010   "TTS;  " (04/18/2015)  . FOOT PAIN 08/12/2008  . GAIT DISTURBANCE 03/03/2010  . GASTROENTERITIS, VIRAL 10/14/2009  . GERD 06/18/2007  . GOITER, MULTINODULAR 12/26/2007  . GOUT 06/18/2007  . GYNECOMASTIA 07/17/2010  . Hemodialysis access, fistula [name])    Dialysis T-[place]-Sa ([place]) Right upper arm fistula  . Hyperlipidemia 10/16/2011  . Hyperparathyroidism, secondary ([place]) 06/16/2011  . HYPERTENSION 06/18/2007  . Hyperthyroidism   . HYPERTHYROIDISM 02/02/2010  . Hypocalcemia 06/07/2010  . Ischemic cardiomyopathy 06/16/2011  . NECK PAIN 07/31/2010  . NSTEMI (non-ST elevated myocardial infarction) ([place]) 08/04/2016  . ONYCHOMYCOSIS, TOENAILS 12/26/2007  . OSA on CPAP 10/16/2011  . Other malaise and fatigue 11/24/2009  . PERIPHERAL NEUROPATHY 06/18/2007  . Prostate cancer ([place])   . PULMONARY NODULE, RIGHT LOWER LOBE 06/08/2009  . Renal insufficiency   . Sleep apnea    cpap machine and o2  . TRANSAMINASES, SERUM, ELEVATED 02/01/2010  . Transfusion history    none recent  . Unspecified hypotension 01/30/2010    Past Surgical History:  Procedure Laterality Date  . ARTERIOVENOUS GRAFT PLACEMENT Right 2009   forearm/notes 02/01/2011  . AV FISTULA PLACEMENT  11/07/2011   Procedure: INSERTION OF ARTERIOVENOUS (AV) GORE-TEX GRAFT ARM;  Surgeon: Tinnie Gens, MD;  Location: [place];  Service: Vascular;  Laterality: Left;  . BACK SURGERY  1998  . BASCILIC VEIN TRANSPOSITION Right 02/27/2013   Procedure: BASCILIC VEIN TRANSPOSITION;  Surgeon: Mal Misty,  MD;  Location: [facility] OR;  Service: Vascular;  Laterality: Right;  Right Basilic Vein Transposition   . CARDIAC CATHETERIZATION N/A 08/06/2016   Procedure: Left Heart Cath and Coronary Angiography;  Surgeon: Jolaine Artist, MD;  Location: [place] CV LAB;  Service: Cardiovascular;  Laterality: N/A;  .  CARDIAC CATHETERIZATION N/A 08/07/2016   Procedure: Coronary/Graft Atherectomy-CSI LAD;  Surgeon: Peter M Martinique, MD;  Location: [place] CV LAB;  Service: Cardiovascular;  Laterality: N/A;  . CERVICAL SPINE SURGERY  2/09   "to repair nerve problems in my left arm"  . CHOLECYSTECTOMY    . CORONARY ANGIOPLASTY WITH STENT PLACEMENT  06/11/2008  . CORONARY ANGIOPLASTY WITH STENT PLACEMENT  06/2007   TAXUS stent to RCA/notes 01/31/2011  . ESOPHAGOGASTRODUODENOSCOPY  09/28/2011   Procedure: ESOPHAGOGASTRODUODENOSCOPY (EGD);  Surgeon: Missy Sabins, MD;  Location: [facility] ENDOSCOPY;  Service: Endoscopy;  Laterality: N/A;  . ESOPHAGOGASTRODUODENOSCOPY N/A 04/07/2015   Procedure: ESOPHAGOGASTRODUODENOSCOPY (EGD);  Surgeon: Teena Irani, MD;  Location: [facility] ENDOSCOPY;  Service: Endoscopy;  Laterality: N/A;  . ESOPHAGOGASTRODUODENOSCOPY N/A 04/19/2015   Procedure: ESOPHAGOGASTRODUODENOSCOPY (EGD);  Surgeon: Arta Silence, MD;  Location: [facility] ENDOSCOPY;  Service: Endoscopy;  Laterality: N/A;  . FOREIGN BODY REMOVAL  09/2003   via upper endoscopy/notes 02/12/2011  . GIVENS CAPSULE STUDY  09/30/2011   Procedure: GIVENS CAPSULE STUDY;  Surgeon: Jeryl Columbia, MD;  Location: [facility] ENDOSCOPY;  Service: Endoscopy;  Laterality: N/A;  . INSERTION OF DIALYSIS CATHETER Right 2014  . INSERTION OF DIALYSIS CATHETER Left 02/11/2013   Procedure: INSERTION OF DIALYSIS CATHETER;  Surgeon: Conrad Viola, MD;  Location: [place];  Service: Vascular;  Laterality: Left;  Ultrasound guided  . REMOVAL OF A DIALYSIS CATHETER Right 02/11/2013   Procedure: REMOVAL OF A DIALYSIS CATHETER;  Surgeon: Conrad Lake Forest, MD;  Location: [place];  Service: Vascular;  Laterality: Right;  . SAVORY DILATION N/A 04/07/2015   Procedure: SAVORY DILATION;  Surgeon: Teena Irani, MD;  Location: [facility] ENDOSCOPY;  Service: Endoscopy;  Laterality: N/A;  . SHUNTOGRAM N/A 09/20/2011   Procedure: Earney Mallet;  Surgeon: Conrad Center Mor[place]hes, MD;  Location[facility] CATH LAB;  Service: Cardiovascular;   Laterality: N/A;  . SVT ABLATION N/A 11/26/2016   Procedure: SVT Ablation;  Surgeon: Will Meredith Leeds, MD;  Location: [place] CV LAB;  Service: Cardiovascular;  Laterality: N/A;  . TONSILLECTOMY    . TOTAL KNEE ARTHROPLASTY Right 08/02/2015   Procedure: TOTAL KNEE ARTHROPLASTY;  Surgeon: Renette Butters, MD;  Location: [place];  Service: Orthopedics;  Laterality: Right;  . VENOGRAM N/A 01/26/2013   Procedure: VENOGRAM;  Surgeon: Angelia Mould, MD;  Location: [facility] CATH LAB;  Service: Cardiovascular;  Laterality: N/A;    Prior to Admission medications   Medication Sig Start Date End Date Taking? Authorizing Provider  acetaminophen (TYLENOL) 325 MG tablet Take 650 mg by mouth every 6 (six) hours as needed for mild pain or headache.    Yes [provider]  allopurinol (ZYLOPRIM) 100 MG tablet Take 1 tablet (100 mg total) by mouth daily. 07/20/16  Yes Biagio Borg, MD  aspirin 81 MG tablet Take 81 mg by mouth daily.   Yes [provider]  citalopram (CELEXA) 10 MG tablet Take 10 mg by mouth daily as needed (anxiety).    Yes [provider]  clopidogrel (PLAVIX) 75 MG tablet TAKE ONE TABLET BY MOUTH ONCE DAILY WITH  BREAKFAST 03/05/17  Yes Bensimhon, Shaune Pascal, MD  colesevelam [facility]) 625 MG tablet  TAKE THREE TABLETS BY MOUTH TWICE DAILY WITH MEALS 07/20/16  Yes Biagio Borg, MD  ezetimibe (ZETIA) 10 MG tablet TAKE ONE TABLET BY MOUTH ONCE DAILY 04/08/17  Yes Bensimhon, Shaune Pascal, MD  gabapentin (NEURONTIN) 300 MG capsule 1-2 tabs by mouth at bedtime for sleep and pain 07/20/16  Yes Biagio Borg, MD  HYDROcodone-acetaminophen (NORCO/VICODIN) 5-325 MG tablet Take 1 tablet by mouth every 6 (six) hours as needed for moderate pain.   Yes [provider]  hydroxypropyl methylcellulose / hypromellose (ISOPTO TEARS / GONIOVISC) 2.5 % ophthalmic solution Place 1 drop into both eyes 3 (three) times daily as needed for dry eyes.   Yes [provider]   Lanthanum Carbonate 1000 MG PACK Take 3 packets by mouth 2 (two) times daily.    Yes [provider]  methimazole (TAPAZOLE) 5 MG tablet Take 1 tablet (5 mg total) by mouth daily. 10/24/16  Yes Renato Shin, MD  metoprolol tartrate (LOPRESSOR) 25 MG tablet Take 1 tablet (25 mg total) by mouth 2 (two) times daily. ON NON-HD DAYS 12/26/16  Yes Camnitz, Ocie Doyne, MD  midodrine (PROAMATINE) 10 MG tablet Take 1 tablet (10 mg total) by mouth See admin instructions. Once a day only on dialysis days (Tues/Thurs/Sat) 10/18/16  Yes Regalado, Belkys A, MD  multivitamin (RENA-VIT) TABS tablet Take 1 tablet by mouth at bedtime. Reported on 10/24/2015   Yes [provider]  pantoprazole (PROTONIX) 40 MG tablet Take 40 mg by mouth every evening.  08/31/16  Yes [provider]  SENSIPAR 90 MG tablet Take 90 mg by mouth 2 (two) times daily. 09/18/16  Yes [provider]    Scheduled Meds: . allopurinol  100 mg Oral Daily  . cinacalcet  90 mg Oral BID WC  . [START ON 04/24/2017] citalopram  10 mg Oral Once per day on Mon Wed Fri  . colesevelam  625 mg Oral BID WC  . ezetimibe  10 mg Oral Daily  . gabapentin  300-600 mg Oral QHS  . lanthanum  3,000 mg Oral BID WC  . methimazole  5 mg Oral Daily  . [START ON 04/24/2017] metoprolol tartrate  25 mg Oral 2 times per day on Sun Mon Wed Fri  . midodrine  10 mg Oral Q T,Th,Sa-HD  . multivitamin  1 tablet Oral QHS  . pantoprazole  40 mg Oral BID  . sodium chloride flush  3 mL Intravenous Q12H  . sodium chloride flush  3 mL Intravenous Q12H   Continuous Infusions: . sodium chloride     PRN Meds:.sodium chloride, acetaminophen **OR** acetaminophen, hydrALAZINE, HYDROcodone-acetaminophen, ondansetron **OR** ondansetron (ZOFRAN) IV, sodium chloride flush  Allergies as of 04/22/2017 - Review Complete 04/22/2017  Allergen Reaction Noted  . Cephalexin Swelling and Other (See Comments)   . Statins Other (See Comments) 06/03/2009  .  Ciprofloxacin Rash 08/02/2015    Family History  Problem Relation Age of Onset  . Heart disease Sister   . Thyroid nodules Sister   . Heart disease Father   . Diabetes Father   . Kidney failure Father   . Hypertension Father   . Healthy Child   . Healthy Child   . Healthy Child   . Cancer Neg Hx     Social History   Social History  . Marital status: Married    Spouse name: N/A  . Number of children: 3  . Years of education: N/A   Occupational History  . disabled Disabled  .  formerly Lawyer for [employer].    Social History Main Topics  . Smoking status: Former Smoker    Packs/day: 1.00    Years: 25.00    Types: Cigarettes    Quit date: 10/01/2005  . Smokeless tobacco: Former Systems developer    Quit date: 10/01/2005     Comment: Quit smoking 2007 Smoked x 25 years 1/2 ppd.  . Alcohol use No  . Drug use: No  . Sexual activity: No   Other Topics Concern  . Not on file   Social History Narrative  . No narrative on file    Review of Systems: Review of Systems  Constitutional: Negative for chills and fever.  HENT: Negative for hearing loss and tinnitus.   Eyes: Negative for blurred vision and double vision.  Respiratory: Negative for cough and hemoptysis.   Cardiovascular: Negative for chest pain and palpitations.  Gastrointestinal: Positive for blood in stool and heartburn. Negative for melena, nausea and vomiting.  Genitourinary: Negative for dysuria and urgency.  Musculoskeletal: Negative for myalgias and neck pain.  Skin: Negative for itching and rash.  Neurological: Negative for seizures and loss of consciousness.  Endo/Heme/Allergies: Bruises/bleeds easily.  Psychiatric/Behavioral: Negative for hallucinations and suicidal ideas.    Physical Exam: Vital signs: Vitals:   04/22/17 2230 04/23/17 0517  BP: (!) 183/87 117/89  Pulse: 70 74  Resp: 14 16  Temp: 98.1 F (36.7 C) 97.7 F (36.5 C)   Last BM Date: 04/22/17 General:   Alert,  Well-developed,  well-nourished, pleasant and cooperative in NAD HEENT : Normocephalic, atraumatic, extraocular movement intact. Moist mucosa. Lungs:  Clear throughout to auscultation.   No wheezes, crackles, or rhonchi. No acute distress. Heart:  Regular rate and rhythm; no murmurs, clicks, rubs,  or gallops. Abdomen: Soft, nontender, nondistended, bowel sounds present. No peritoneal signs Lower extremities And no edema. Pulses intact Psych :  Alert oriented 3. Mood and affect normal Rectal:  Deferred  GI:  Lab Results:  Recent Labs  04/22/17 1245 04/23/17 0353  WBC 6.8 6.9  HGB 10.0* 9.2*  HCT 31.8* 29.5*  PLT 243 224   BMET  Recent Labs  04/22/17 1245 04/23/17 0353  NA 142 140  K 4.6 4.7  CL 104 103  CO2 22 25  GLUCOSE 139* 143*  BUN 63* 74*  CREATININE 11.20* 12.18*  CALCIUM 8.9 8.4*   LFT  Recent Labs  04/22/17 1245  PROT 6.8  ALBUMIN 3.5  AST 22  ALT 19  ALKPHOS 88  BILITOT 0.6  BILIDIR <0.1*  IBILI NOT CALCULATED   PT/INR  Recent Labs  04/22/17 1245  LABPROT 12.6  INR 0.95     Studies/Results: Ct Abdomen Pelvis Wo Contrast  Result Date: 04/22/2017 CLINICAL DATA:  Rectal bleeding and weakness for several weeks, initial encounter EXAM: CT ABDOMEN AND PELVIS WITHOUT CONTRAST TECHNIQUE: Multidetector CT imaging of the abdomen and pelvis was performed following the standard protocol without IV contrast. COMPARISON:  10/14/2015 FINDINGS: Lower chest: Lung bases are well aerated. Only minimal dependent atelectatic changes are seen. Some mild bronchiectasis is noted in the right lower lobe. Hepatobiliary: Gallbladder has been surgically removed. Pneumobilia is noted likely related to prior intervention. Pancreas: Unremarkable. No pancreatic ductal dilatation or surrounding inflammatory changes. Spleen: Normal in size without focal abnormality. Adrenals/Urinary Tract: Kidneys demonstrate bilateral cystic change and atrophy consistent with the given clinical history of  end-stage renal disease. A few tiny nonobstructing renal stones are seen. The adrenal glands are unremarkable. The bladder is  decompressed. Stomach/Bowel: No obstructive changes are seen. No significant inflammatory changes are noted. The appendix is within normal limits. Vascular/Lymphatic: Aortic atherosclerosis. No enlarged abdominal or pelvic lymph nodes. Reproductive: Prostate is unremarkable. Other: No abdominal wall hernia or abnormality. No abdominopelvic ascites. Musculoskeletal: Degenerative changes of lumbar spine are seen. No acute bony abnormality is noted. Postsurgical changes are also noted in the lower lumbar spine. IMPRESSION: Chronic changes similar to that seen on prior exam. No acute abnormality is identified correspond with patient's given clinical symptomatology. Electronically Signed   By: Inez Catalina M.D.   On: 04/22/2017 20:12   Dg Chest 2 View  Result Date: 04/22/2017 CLINICAL DATA:  Patient states that he is having burning feeling in chest and very weak, states he has blood coming out of his rectum, that is a dark red and bright red. Hx of hyperlipidemia, NSTEMI, prostate cancer, diabetes, hypertension. EXAM: CHEST  2 VIEW COMPARISON:  Chest x-ray dated 12/26/2016. Chest x-ray dated 08/04/2016. FINDINGS: Heart size and mediastinal contours are within normal limits. Aortic atherosclerosis. Mild scarring in the left mid lung. Lungs otherwise clear. No pleural effusion or pneumothorax seen. No acute or suspicious osseous finding. IMPRESSION: No active cardiopulmonary disease. No evidence of pneumonia or pulmonary edema. Aortic atherosclerosis. Electronically Signed   By: Franki Cabot M.D.   On: 04/22/2017 13:20    Impression/Plan: - Intermittent lower GI bleed since last 2 months. Painless bleeding. History of questionable radiation for prostate cancer. Differential diagnosis include radiation proctitis versus hemorrhoidal bleeding versus AVM - Acute on chronic anemia - Coronary  artery disease status post PCI in November 2017. Status post AVNRT ablation in February 2018 - End-stage renal disease on hemodialysis.  Recommendations ------------------------- - Patient is currently on aspirin and Plavix. Will need clearance from cardiology for procedure. - Okay to have clear liquid diet today. - Monitor H&H. Transfuse to keep hemoglobin more than 8. GI will follow    LOS: 1 day   Otis Brace  MD, FACP 04/23/2017, 8:37 AM  Pager [phone] If no answer or after 5 PM call [phone]

## 2017-04-23 NOTE — Consult Note (Addendum)
Cardiology Consult    Patient ID: MAGUIRE SIME MRN: [id number], DOB/AGE: 03-05-1951   Admit date: 04/22/2017 Date of Consult: 04/23/2017  Primary Physician: Biagio Borg, MD Primary Cardiologist: D. Bensimhon, MD / Elliot Cousin, MD  Requesting Provider: Murvin Natal  Patient Profile    BREYTON VANSCYOC is a 66 y.o. male with a history of CAD s/p prior RCA and LAD stenting, HTN, HL, DMII, combined CHF, ICM, PSVT, and ESRD, who is being seen today for the evaluation of chest pain and DAPT in the setting of BRBPR at the request of Dr. Murvin Natal.  Past Medical History   Past Medical History:  Diagnosis Date  . Allergic rhinitis, cause unspecified 02/24/2014  . Anemia 06/16/2011  . BENIGN PROSTATIC HYPERTROPHY 10/14/2009  . CAD, NATIVE VESSEL 02/06/2009   a. 06/2007 s/p Taxus DES to the RCA;  b. 08/2016 NSTEMI in setting of SVT/PCI: LM 30ost, LAD 52m (3.0x16 Synergy DES), LCX 51m, OM1 60, RI 40, RCA 70p/m, 63m - not amenable to PCI.  [name] Cervical radiculopathy, chronic 02/23/2016   Right c5-6 by NCS/EMG  . CHEST PAIN 03/29/2010  . Chronic combined systolic (congestive) and diastolic (congestive) heart failure ([place])    a. 10/2016 Echo: EF 40-45%, Gr2 DD. mildly dil LA.  [name] COLONIC POLYPS, HX OF 10/14/2009  . Dementia [id number]  . DEPRESSION 10/14/2009  . Depression 09/24/2015  . DIABETES MELLITUS, TYPE II 02/01/2010  . DIZZINESS 07/17/2010  . DYSLIPIDEMIA 06/18/2007  . ESRD (end stage renal disease) on dialysis ([employer]) 08/04/2010   "TTS;  " (04/18/2015)  . FOOT PAIN 08/12/2008  . GAIT DISTURBANCE 03/03/2010  . GASTROENTERITIS, VIRAL 10/14/2009  . GERD 06/18/2007  . GOITER, MULTINODULAR 12/26/2007  . GOUT 06/18/2007  . GYNECOMASTIA 07/17/2010  . Hemodialysis access, fistula mature [facility])    Dialysis T-Th-Sa ([place]) Right upper arm fistula  . Hyperlipidemia 10/16/2011  . Hyperparathyroidism, secondary ([place]) 06/16/2011  . HYPERTENSION 06/18/2007  . Hyperthyroidism   . Hypocalcemia  06/07/2010  . Ischemic cardiomyopathy    a. 10/2016 Echo: EF 40-45%.  [name] NECK PAIN 07/31/2010  . ONYCHOMYCOSIS, TOENAILS 12/26/2007  . OSA on CPAP 10/16/2011  . Other malaise and fatigue 11/24/2009  . PERIPHERAL NEUROPATHY 06/18/2007  . Prostate cancer ([place])   . PSVT (paroxysmal supraventricular tachycardia) ([place])    a. 11/6376 complicated by NSTEMI;  b. 11/2016 Treated w/ adenosine in ED;  c. 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  [name] PULMONARY NODULE, RIGHT LOWER LOBE 06/08/2009  . Sleep apnea    cpap machine and o2  . TRANSAMINASES, SERUM, ELEVATED 02/01/2010  . Transfusion history    none recent  . Unspecified hypotension 01/30/2010    Past Surgical History:  Procedure Laterality Date  . ARTERIOVENOUS GRAFT PLACEMENT Right 2009   forearm/notes 02/01/2011  . AV FISTULA PLACEMENT  11/07/2011   Procedure: INSERTION OF ARTERIOVENOUS (AV) GORE-TEX GRAFT ARM;  Surgeon: Tinnie Gens, MD;  Location: [place];  Service: Vascular;  Laterality: Left;  . BACK SURGERY  1998  . BASCILIC VEIN TRANSPOSITION Right 02/27/2013   Procedure: BASCILIC VEIN TRANSPOSITION;  Surgeon: Mal Misty, MD;  Location: [place];  Service: Vascular;  Laterality: Right;  Right Basilic Vein Transposition   . CARDIAC CATHETERIZATION N/A 08/06/2016   Procedure: Left Heart Cath and Coronary Angiography;  Surgeon: Jolaine Artist, MD;  Location: [place] CV LAB;  Service: Cardiovascular;  Laterality: N/A;  . CARDIAC CATHETERIZATION N/A 08/07/2016   Procedure: Coronary/Graft Atherectomy-CSI LAD;  Surgeon: Peter M Martinique, MD;  Location: [place] CV LAB;  Service: Cardiovascular;  Laterality: N/A;  . CERVICAL SPINE SURGERY  2/09   "to repair nerve problems in my left arm"  . CHOLECYSTECTOMY    . CORONARY ANGIOPLASTY WITH STENT PLACEMENT  06/11/2008  . CORONARY ANGIOPLASTY WITH STENT PLACEMENT  06/2007   TAXUS stent to RCA/notes 01/31/2011  . ESOPHAGOGASTRODUODENOSCOPY  09/28/2011   Procedure: ESOPHAGOGASTRODUODENOSCOPY (EGD);  Surgeon: Missy Sabins, MD;  Location: [facility] ENDOSCOPY;  Service: Endoscopy;  Laterality: N/A;  . ESOPHAGOGASTRODUODENOSCOPY N/A 04/07/2015   Procedure: ESOPHAGOGASTRODUODENOSCOPY (EGD);  Surgeon: Teena Irani, MD;  Location: [facility] END[facility];  Service: Endoscopy;  Laterality: N/A;  . ESOPHAGOGASTRODUODENOSCOPY N/A 04/19/2015   Procedure: ESOPHAGOGASTRODUODENOSCOPY (EGD);  Surgeon: Arta Silence, MD;  Location: [facility] ENDOSCOPY;  Service: Endoscopy;  Laterality: N/A;  . FOREIGN BODY REMOVAL  09/2003   via upper endoscopy/notes 02/12/2011  . GIVENS CAPSULE STUDY  09/30/2011   Procedure: GIVENS CAPSULE STUDY;  Surgeon: Jeryl Columbia, MD;  Location: [facility] ENDOSCOPY;  Service: Endoscopy;  Laterality: N/A;  . INSERTION OF DIALYSIS CATHETER Right 2014  . INSERTION OF DIALYSIS CATHETER Left 02/11/2013   Procedure: INSERTION OF DIALYSIS CATHETER;  Surgeon: Conrad [place], MD;  Location: [place];  Service: Vascular;  Laterality: Left;  Ultrasound guided  . REMOVAL OF A DIALYSIS CATHETER Right 02/11/2013   Procedure: REMOVAL OF A DIALYSIS CATHETER;  Surgeon: Conrad [place], MD;  Location: [place];  Service: Vascular;  Laterality: Right;  . SAVORY DILATION N/A 04/07/2015   Procedure: SAVORY DILATION;  Surgeon: Teena Irani, MD;  Location: [facility] ENDOSCOPY;  Service: Endoscopy;  Laterality: N/A;  . SHUNTOGRAM N/A 09/20/2011   Procedure: Earney Mallet;  Surgeon: Conrad [place], MD;  Location: [facility] CATH LAB;  Service: Cardiovascular;  Laterality: N/A;  . SVT ABLATION N/A 11/26/2016   Procedure: SVT Ablation;  Surgeon: Will Meredith Leeds, MD;  Location: [place] CV LAB;  Service: Cardiovascular;  Laterality: N/A;  . TONSILLECTOMY    . TOTAL KNEE ARTHROPLASTY Right 08/02/2015   Procedure: TOTAL KNEE ARTHROPLASTY;  Surgeon: Renette Butters, MD;  Location: [place];  Service: Orthopedics;  Laterality: Right;  . VENOGRAM N/A 01/26/2013   Procedure: VENOGRAM;  Surgeon: Angelia Mould, MD;  Location: [facility] CATH LAB;  Service: Cardiovascular;  Laterality: N/A;      Allergies  Allergies  Allergen Reactions  . Cephalexin Swelling and Other (See Comments)    Tongue swelling  . Statins Other (See Comments)    Weak muscles  . Ciprofloxacin Rash    History of Present Illness    66 y/o ? with the above complex PMH including CAD, HTN, HL, DMII, ESRD on T/T/S dialysis, PSVT s/p RFCA, and OSA on CPAP.  Cardiac hx dates back to 2008, @ which time he underwent RCA DES.  In 08/2016, he presented with PSVT and NSTEMI.  Cath showed severe LAD and RCA dzs.  The LAD was treated with a DES, while it was felt that the RCA was not amenable to PCI.  He has been medically managed since.  In 11/2016, he was seen in the ED with recurrent PSVT, which broke with adenosine.  He was subsequently seen by EP and underwent RFCA for AVNRT in late Feb.  He says that he tends to be very fatigued and feel poorly on dialysis days but in general, does not experience chest pain or dyspnea during dialysis.  Over the past 1-2 months, he has been noticing intermittent  BRBPR.  In May, he saw GI with recommendation for colonoscopy, however given recent stent, it was felt that procedure should be deferred if possible, and thus the procedure was cancelled.  Over the past one to two weeks, he has had increased frequency of BRBPR, especially over this past weekend.  In that setting, he has also noted increased DOE and intermittent low-level exertional chest pressure on Sunday and Monday.  Due to increasing BRBPR and feeling poorly, he presented to the ED on 7/23.  Here, his H/H were relatively stable.  Troponin nl.  He has been seen by GI with recommendation for colonoscopy.  We have been asked to eval due to ongoing DAPT.  Pt has not had any recurrent c/p.  Inpatient Medications    . allopurinol  100 mg Oral Daily  . cinacalcet  90 mg Oral BID WC  . [START ON 04/24/2017] citalopram  10 mg Oral Once per day on Mon Wed Fri  . colesevelam  625 mg Oral BID WC  . darbepoetin (ARANESP) injection -  DIALYSIS  150 mcg Intravenous Q Tue-HD  . doxercalciferol  6 mcg Intravenous Q T,Th,Sa-HD  . ezetimibe  10 mg Oral Daily  . gabapentin  300-600 mg Oral QHS  . lanthanum  3,000 mg Oral BID WC  . methimazole  5 mg Oral Daily  . [START ON 04/24/2017] metoprolol tartrate  25 mg Oral 2 times per day on Sun Mon Wed Fri  . midodrine  10 mg Oral Q T,Th,Sa-HD  . multivitamin  1 tablet Oral QHS  . pantoprazole  40 mg Oral BID  . sodium chloride flush  3 mL Intravenous Q12H  . sodium chloride flush  3 mL Intravenous Q12H    Family History    Family History  Problem Relation Age of Onset  . Heart disease Sister   . Thyroid nodules Sister   . Heart disease Father   . Diabetes Father   . Kidney failure Father   . Hypertension Father   . Healthy Child   . Healthy Child   . Healthy Child   . Cancer Neg Hx     Social History    Social History   Social History  . Marital status: Married    Spouse name: N/A  . Number of children: 3  . Years of education: N/A   Occupational History  . 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    Social History Main Topics  . Smoking status: Former Smoker    Packs/day: 1.00    Years: 25.00    Types: Cigarettes    Quit date: 10/01/2005  . Smokeless tobacco: Former Systems developer    Quit date: 10/01/2005     Comment: Quit smoking 2007 Smoked x 25 years 1/2 ppd.  . Alcohol use No  . Drug use: No  . Sexual activity: No   Other Topics Concern  . Not on file   Social History Narrative  . No narrative on file     Review of Systems    General:  No chills, fever, night sweats or weight changes.  Cardiovascular:  +++ exertional chest pain and dyspnea prior to admission, no edema, orthopnea, palpitations, paroxysmal nocturnal dyspnea. Dermatological: No rash, lesions/masses Respiratory: No cough, +++ dyspnea Urologic: No hematuria, dysuria Abdominal:   No nausea, vomiting, diarrhea, +++ bright red blood per rectum, ? melena, no  hematemesis Neurologic:  No visual changes, wkns, changes in mental status. All other systems reviewed and are  otherwise negative except as noted above.  Physical Exam    Blood pressure (P) 120/66, pulse (P) 72, temperature (P) 97.9 F (36.6 C), temperature source (P) Oral, resp. rate (P) 16, height 6\' 1"  (1.854 m), weight (P) 230 lb 13.2 oz (104.7 kg), SpO2 (P) 96 %.  General: Pleasant, NAD Psych: Normal affect. Neuro: Alert and oriented X 3. Moves all extremities spontaneously. HEENT: Normal  Neck: Supple without bruits or JVD. Lungs:  Resp regular and unlabored, CTA. Heart: RRR no s3, s4, or murmurs. Abdomen: Soft, non-tender, non-distended, BS + x 4.  Extremities: No clubbing, cyanosis or edema. DP/PT/Radials 2+ and equal bilaterally.  Labs    Troponin [facility] of Care Test)  Recent Labs  04/22/17 1724  TROPIPOC 0.02    Lab Results  Component Value Date   WBC 6.9 04/23/2017   HGB 9.2 (L) 04/23/2017   HCT 29.5 (L) 04/23/2017   MCV 93.4 04/23/2017   PLT 224 04/23/2017    Recent Labs Lab 04/22/17 1245 04/23/17 0353  NA 142 140  K 4.6 4.7  CL 104 103  CO2 22 25  BUN 63* 74*  CREATININE 11.20* 12.18*  CALCIUM 8.9 8.4*  PROT 6.8  --   BILITOT 0.6  --   ALKPHOS 88  --   ALT 19  --   AST 22  --   GLUCOSE 139* 143*   Lab Results  Component Value Date   CHOL 268 (H) 10/18/2016   HDL 46 10/18/2016   LDLCALC 170 (H) 10/18/2016   TRIG 262 (H) 10/18/2016   Lab Results  Component Value Date   DDIMER 2.61 (H) 12/27/2016     Radiology Studies    Ct Abdomen Pelvis Wo Contrast  Result Date: 04/22/2017 CLINICAL DATA:  Rectal bleeding and weakness for several weeks, initial encounter EXAM: CT ABDOMEN AND PELVIS WITHOUT CONTRAST TECHNIQUE: Multidetector CT imaging of the abdomen and pelvis was performed following the standard protocol without IV contrast. COMPARISON:  10/14/2015 FINDINGS: Lower chest: Lung bases are well aerated. Only minimal dependent atelectatic  changes are seen. Some mild bronchiectasis is noted in the right lower lobe. Hepatobiliary: Gallbladder has been surgically removed. Pneumobilia is noted likely related to prior intervention. Pancreas: Unremarkable. No pancreatic ductal dilatation or surrounding inflammatory changes. Spleen: Normal in size without focal abnormality. Adrenals/Urinary Tract: Kidneys demonstrate bilateral cystic change and atrophy consistent with the given clinical history of end-stage renal disease. A few tiny nonobstructing renal stones are seen. The adrenal glands are unremarkable. The bladder is decompressed. Stomach/Bowel: No obstructive changes are seen. No significant inflammatory changes are noted. The appendix is within normal limits. Vascular/Lymphatic: Aortic atherosclerosis. No enlarged abdominal or pelvic lymph nodes. Reproductive: Prostate is unremarkable. Other: No abdominal wall hernia or abnormality. No abdominopelvic ascites. Musculoskeletal: Degenerative changes of lumbar spine are seen. No acute bony abnormality is noted. Postsurgical changes are also noted in the lower lumbar spine. IMPRESSION: Chronic changes similar to that seen on prior exam. No acute abnormality is identified correspond with patient's given clinical symptomatology. Electronically Signed   By: Inez Catalina M.D.   On: 04/22/2017 20:12   Dg Chest 2 View  Result Date: 04/22/2017 CLINICAL DATA:  Patient states that he is having burning feeling in chest and very weak, states he has blood coming out of his rectum, that is a dark red and bright red. Hx of hyperlipidemia, NSTEMI, prostate cancer, diabetes, hypertension. EXAM: CHEST  2 VIEW COMPARISON:  Chest x-ray dated 12/26/2016. Chest x-ray dated 08/04/2016.  FINDINGS: Heart size and mediastinal contours are within normal limits. Aortic atherosclerosis. Mild scarring in the left mid lung. Lungs otherwise clear. No pleural effusion or pneumothorax seen. No acute or suspicious osseous finding.  IMPRESSION: No active cardiopulmonary disease. No evidence of pneumonia or pulmonary edema. Aortic atherosclerosis. Electronically Signed   By: Franki Cabot M.D.   On: 04/22/2017 13:20    ECG & Cardiac Imaging    Rsr, 84, pvc, non-specific st/t changes.  No acute changes.  Assessment & Plan    1.  Intermittent GI Bleed/BRBPR:  Pt with a 1-2 month h/o intermittent BRBPR in the setting of DAPT.  Previously advised to undergo colonoscopy however we were reluctant to hold DAPT given DES to LAD in November.  With increased bleeding, slightly more pronounced anemia, and fatigue/dyspnea, he will need colonoscopy.  As he is further than six months out from LAD stenting, we will hold asa/plavix, though we would like to resume once cleared by GI to do so. Transfuse for Hgb <8 - currently not an issue.  2.  CAD/Stable Angina:  Pt is s/p RCA DES in 2008 and LAD DES in 08/2016.  Residual RCA dzs, which is not felt to be amenable to PCI.  In the setting of BRBPR and mild, stable anemia, he did note some exertional chest pressure and DOE on Sunday/Monday.  ECG non-acute and troponins neg.  I will add low dose nitrate therapy to his regimen for symptomatic relief. Will need to hold this on HD days given h/o orthostasis req midodrine on T/T/S.  Not ideal for ranexa given ESRD and mildly prolonged QT.  As above, plan to hold DAPT while undergoing GI w/u, with plan to resume when ok to do so.  Cont  blocker and zetia.  3.  Essential HTN:  BPs quite variable in setting of orthostasis on HD days.  Cont  blocker.   4.  HL:  Statin intolerant.  Cont zetia.  5.  DM II:  Per IM.  6.  ESRD:  HD per nephrology.  Signed, Murray Hodgkins, NP 04/23/2017, 5:37 PM   Patient seen and examined. Agree with physical examination findings and assessment and plan.  Mr. Aisea Bouldin is a 66 year old African-American male who is known CAD and is status post remote stenting to his RCA in 2008.  He has a history of obesity,  hypertension, hyperlipidemia with statin intolerance, type 2 diabetes mellitus, combined CHF, a history of PSVT, end-stage renal disease on dialysis, and obstructive sleep apnea on CPAP therapy.  In November 2017 he presented with PSVT/NSTEMI and underwent DES stenting to his LAD.  His RCA was severely diseased, but was not amenable to PCI.  He has been on medical therapy including dual antiplatelet treatment.  He's had issues with recurrent PSVT and ultimately underwent radiofrequency ablation for AV nodal reentrant tachycardia.  Over the past several months he has had issues with recurrent GI bleeding.  He has noticed some increasing dyspnea on exertion.  He denies any of the chest tightness that he experienced prior to his recent stenting and but has noticed fatigue with vague chest sensation.  He tells me that he took his last dose of Plavix yesterday.  He has end-stage renal disease and is on dialysis on Tuesday, Thursdays and Saturdays.  He does not take blood pressure medications on dialysis days and often has to take Midodrin secondary to involvement of low blood pressure on these dialysis days.  Of note, his blood pressure early today  was 725 systolic and later today was 366 systolic following dialysis.  He is in need for GI procedure for further evaluation of his painless GI bleeding.  His ECG reveals normal sinus rhythm at 84 bpm.  There are nonspecific T changes.  QTc interval is slightly increased at 477 ms.  There is a PVC.  The patient has a history of statin intolerance and his last lipid studies done in January 2018 showed marketed elevation of his LDL at 170.  Since that his been 9 months since his last stent insertion and with his ongoing GI bleeding, I feel it is reasonable to hold Plavix for planned GI procedures.  Ideally, Plavix should be held for 5 days prior to intervention, particularly if biopsies are necessary.  We will initiate low-dose nitrate therapy to take on nondialysis days for  improved anti-ischemic benefit and continue current beta blocker therapy , at least on nondialysis days.  We will follow patient with you.  The patient should also continue Zetia, but may possibly be evaluated for PCSK9 inhibition in light of marked LDL increase in this statin intolerant patient.   Troy Sine, MD, [facility] 04/23/2017 6:14 PM

## 2017-04-23 NOTE — Consult Note (Signed)
[facility] Renal Consultation Note    Indication for Consultation:  Management of ESRD/hemodialysis; anemia, hypertension/volume and secondary hyperparathyroidism  HPI: [name] is a 66 y.o. male with ESRD on HD TTS, DM, CAD, ICM s/p RCA PTA stent 2008, STEMI 2017 s/p LAD PCI stent (on Plavix, ASA) OSA, depression/anxiety. Presented to [facility] ED last evening for worsening rectal bleeding. Reports bright red blood per rectum ongoing for "months". Followed by Dr. Paulita Fujita as outpatient and was scheduled to have colonoscopy this summer, but could not be performed d/t ongoing Plavix needs.   Ab CT with no acute findings. Vitals stable. Hgb 10.0 > 9.2.  Admitted for further evaluation. GI consulted. Seen currently on HD. Appears comfortable. Reports painless rectal bleeding intermittently over last 2 months. Denies HA, dizziness, chest pain, abdominal pain, N,V,D.    Dialyzes at [facility]. Compliant with HD and had last treatment on Saturday 7/21. Of note has been leaving below his EDW usually 0.5kg below. Post HD weight 7/12 was 105.6kg. Usually feels drained post HD.   Past Medical History:  Diagnosis Date  . Allergic rhinitis, cause unspecified 02/24/2014  . Anemia 06/16/2011  . BENIGN PROSTATIC HYPERTROPHY 10/14/2009  . CAD, NATIVE VESSEL 02/06/2009   saw Dr. Missy Sabins last jan 2013  . Cervical radiculopathy, chronic 02/23/2016   Right c5-6 by NCS/EMG  . CHEST PAIN 03/29/2010  . COLONIC POLYPS, HX OF 10/14/2009  . CONGESTIVE HEART FAILURE 06/18/2007  . Dementia [id number]  . DEPRESSION 10/14/2009  . Depression 09/24/2015  . DIABETES MELLITUS, TYPE II 02/01/2010  . DIASTOLIC HEART FAILURE, CHRONIC 02/06/2009  . DIZZINESS 07/17/2010  . DYSLIPIDEMIA 06/18/2007  . DYSPNEA 10/29/2008  . ESRD (end stage renal disease) on dialysis ([place]) 08/04/2010   "TTS;  " (04/18/2015)  . FOOT PAIN 08/12/2008  . GAIT DISTURBANCE 03/03/2010  . GASTROENTERITIS, VIRAL 10/14/2009  . GERD 06/18/2007  . GOITER,  MULTINODULAR 12/26/2007  . GOUT 06/18/2007  . GYNECOMASTIA 07/17/2010  . Hemodialysis access, fistula mature [facility])    Dialysis T-Th-Sa ([place]) Right upper arm fistula  . Hyperlipidemia 10/16/2011  . Hyperparathyroidism, secondary ([place]) 06/16/2011  . HYPERTENSION 06/18/2007  . Hyperthyroidism   . HYPERTHYROIDISM 02/02/2010  . Hypocalcemia 06/07/2010  . Ischemic cardiomyopathy 06/16/2011  . NECK PAIN 07/31/2010  . NSTEMI (non-ST elevated myocardial infarction) ([place]) 08/04/2016  . ONYCHOMYCOSIS, TOENAILS 12/26/2007  . OSA on CPAP 10/16/2011  . Other malaise and fatigue 11/24/2009  . PERIPHERAL NEUROPATHY 06/18/2007  . Prostate cancer ([place])   . PULMONARY NODULE, RIGHT LOWER LOBE 06/08/2009  . Renal insufficiency   . Sleep apnea    cpap machine and o2  . TRANSAMINASES, SERUM, ELEVATED 02/01/2010  . Transfusion history    none recent  . Unspecified hypotension 01/30/2010   Past Surgical History:  Procedure Laterality Date  . ARTERIOVENOUS GRAFT PLACEMENT Right 2009   forearm/notes 02/01/2011  . AV FISTULA PLACEMENT  11/07/2011   Procedure: INSERTION OF ARTERIOVENOUS (AV) GORE-TEX GRAFT ARM;  Surgeon: Tinnie Gens, MD;  Location: [place];  Service: Vascular;  Laterality: Left;  . BACK SURGERY  1998  . BASCILIC VEIN TRANSPOSITION Right 02/27/2013   Procedure: BASCILIC VEIN TRANSPOSITION;  Surgeon: Mal Misty, MD;  Location: [place];  Service: Vascular;  Laterality: Right;  Right Basilic Vein Transposition   . CARDIAC CATHETERIZATION N/A 08/06/2016   Procedure: Left Heart Cath and Coronary Angiography;  Surgeon: Jolaine Artist, MD;  Location: [place] CV LAB;  Service: Cardiovascular;  Laterality: N/A;  .  CARDIAC CATHETERIZATION N/A 08/07/2016   Procedure: Coronary/Graft Atherectomy-CSI LAD;  Surgeon: Peter M Martinique, MD;  Location: [place] CV LAB;  Service: Cardiovascular;  Laterality: N/A;  . CERVICAL SPINE SURGERY  2/09   "to repair nerve problems in my left arm"  .  CHOLECYSTECTOMY    . CORONARY ANGIOPLASTY WITH STENT PLACEMENT  06/11/2008  . CORONARY ANGIOPLASTY WITH STENT PLACEMENT  06/2007   TAXUS stent to RCA/notes 01/31/2011  . ESOPHAGOGASTRODUODENOSCOPY  09/28/2011   Procedure: ESOPHAGOGASTRODUODENOSCOPY (EGD);  Surgeon: Missy Sabins, MD;  Location: [facility] ENDOSCOPY;  Service: Endoscopy;  Laterality: N/A;  . ESOPHAGOGASTRODUODENOSCOPY N/A 04/07/2015   Procedure: ESOPHAGOGASTRODUODENOSCOPY (EGD);  Surgeon: Teena Irani, MD;  Location: [facility] END[facility];  Service: Endoscopy;  Laterality: N/A;  . ESOPHAGOGASTRODUODENOSCOPY N/A 04/19/2015   Procedure: ESOPHAGOGASTRODUODENOSCOPY (EGD);  Surgeon: Arta Silence, MD;  Location: [name] ENDOSCOPY;  Service: Endoscopy;  Laterality: N/A;  . FOREIGN BODY REMOVAL  09/2003   via upper endoscopy/notes 02/12/2011  . GIVENS CAPSULE STUDY  09/30/2011   Procedure: GIVENS CAPSULE STUDY;  Surgeon: Jeryl Columbia, MD;  Location: [facility] ENDOSCOPY;  Service: Endoscopy;  Laterality: N/A;  . INSERTION OF DIALYSIS CATHETER Right 2014  . INSERTION OF DIALYSIS CATHETER Left 02/11/2013   Procedure: INSERTION OF DIALYSIS CATHETER;  Surgeon: Conrad Gallatin, MD;  Location: [place];  Service: Vascular;  Laterality: Left;  Ultrasound guided  . REMOVAL OF A DIALYSIS CATHETER Right 02/11/2013   Procedure: REMOVAL OF A DIALYSIS CATHETER;  Surgeon: Conrad Oswego, MD;  Location: [place];  Service: Vascular;  Laterality: Right;  . SAVORY DILATION N/A 04/07/2015   Procedure: SAVORY DILATION;  Surgeon: Teena Irani, MD;  Location: [facility] ENDOSCOPY;  Service: Endoscopy;  Laterality: N/A;  . SHUNTOGRAM N/A 09/20/2011   Procedure: Earney Mallet;  Surgeon: Conrad Milford, MD;  Location: [facility] CATH LAB;  Service: Cardiovascular;  Laterality: N/A;  . SVT ABLATION N/A 11/26/2016   Procedure: SVT Ablation;  Surgeon: Will Meredith Leeds, MD;  Location: [place] CV LAB;  Service: Cardiovascular;  Laterality: N/A;  . TONSILLECTOMY    . TOTAL KNEE ARTHROPLASTY Right 08/02/2015   Procedure: TOTAL KNEE  ARTHROPLASTY;  Surgeon: Renette Butters, MD;  Location: [place];  Service: Orthopedics;  Laterality: Right;  . VENOGRAM N/A 01/26/2013   Procedure: VENOGRAM;  Surgeon: Angelia Mould, MD;  Location: [facility] CATH LAB;  Service: Cardiovascular;  Laterality: N/A;   Family History  Problem Relation Age of Onset  . Heart disease Sister   . Thyroid nodules Sister   . Heart disease Father   . Diabetes Father   . Kidney failure Father   . Hypertension Father   . Healthy Child   . Healthy Child   . Healthy Child   . Cancer Neg Hx    Social History:  reports that he quit smoking about 11 years ago. His smoking use included Cigarettes. He has a 25.00 pack-year smoking history. He quit smokeless tobacco use about 11 years ago. He reports that he does not drink alcohol or use drugs. Allergies  Allergen Reactions  . Cephalexin Swelling and Other (See Comments)    Tongue swelling  . Statins Other (See Comments)    Weak muscles  . Ciprofloxacin Rash   Prior to Admission medications   Medication Sig Start Date End Date Taking? Authorizing Provider  acetaminophen (TYLENOL) 325 MG tablet Take 650 mg by mouth every 6 (six) hours as needed for mild pain or headache.    Yes [provider]  allopurinol (ZYLOPRIM) 100 MG tablet Take 1 tablet (100 mg total) by mouth daily. 07/20/16  Yes Biagio Borg, MD  aspirin 81 MG tablet Take 81 mg by mouth daily.   Yes [provider]  citalopram (CELEXA) 10 MG tablet Take 10 mg by mouth daily as needed (anxiety).    Yes [provider]  clopidogrel (PLAVIX) 75 MG tablet TAKE ONE TABLET BY MOUTH ONCE DAILY WITH  BREAKFAST 03/05/17  Yes Bensimhon, Shaune Pascal, MD  colesevelam [facility]) 625 MG tablet TAKE THREE TABLETS BY MOUTH TWICE DAILY WITH MEALS 07/20/16  Yes Biagio Borg, MD  ezetimibe (ZETIA) 10 MG tablet TAKE ONE TABLET BY MOUTH ONCE DAILY 04/08/17  Yes Bensimhon, Shaune Pascal, MD  gabapentin (NEURONTIN) 300 MG capsule 1-2 tabs by mouth at  bedtime for sleep and pain 07/20/16  Yes Biagio Borg, MD  HYDROcodone-acetaminophen (NORCO/VICODIN) 5-325 MG tablet Take 1 tablet by mouth every 6 (six) hours as needed for moderate pain.   Yes [provider]  hydroxypropyl methylcellulose / hypromellose (ISOPTO TEARS / GONIOVISC) 2.5 % ophthalmic solution Place 1 drop into both eyes 3 (three) times daily as needed for dry eyes.   Yes [provider]  Lanthanum Carbonate 1000 MG PACK Take 3 packets by mouth 2 (two) times daily.    Yes [provider]  methimazole (TAPAZOLE) 5 MG tablet Take 1 tablet (5 mg total) by mouth daily. 10/24/16  Yes Renato Shin, MD  metoprolol tartrate (LOPRESSOR) 25 MG tablet Take 1 tablet (25 mg total) by mouth 2 (two) times daily. ON NON-HD DAYS 12/26/16  Yes Camnitz, Ocie Doyne, MD  midodrine (PROAMATINE) 10 MG tablet Take 1 tablet (10 mg total) by mouth See admin instructions. Once a day only on dialysis days (Tues/Thurs/Sat) 10/18/16  Yes Regalado, Belkys A, MD  multivitamin (RENA-VIT) TABS tablet Take 1 tablet by mouth at bedtime. Reported on 10/24/2015   Yes [provider]  pantoprazole (PROTONIX) 40 MG tablet Take 40 mg by mouth every evening.  08/31/16  Yes [provider]  SENSIPAR 90 MG tablet Take 90 mg by mouth 2 (two) times daily. 09/18/16  Yes [provider]   Current Facility-Administered Medications  Medication Dose Route Frequency Provider Last Rate Last Dose  . 0.9 %  sodium chloride infusion  250 mL Intravenous PRN Opyd, Ilene Qua, MD      . acetaminophen (TYLENOL) tablet 650 mg  650 mg Oral Q6H PRN Opyd, Ilene Qua, MD       Or  . acetaminophen (TYLENOL) suppository 650 mg  650 mg Rectal Q6H PRN Opyd, Ilene Qua, MD      . allopurinol (ZYLOPRIM) tablet 100 mg  100 mg Oral Daily Opyd, Ilene Qua, MD      . cinacalcet (SENSIPAR) tablet 90 mg  90 mg Oral BID WC Opyd, Ilene Qua, MD      . Derrill Memo ON 04/24/2017] citalopram (CELEXA) tablet 10 mg  10 mg  Oral Once per day on Mon Wed Fri Opyd, Timothy S, MD      . colesevelam [facility]) tablet 625 mg  625 mg Oral BID WC Opyd, Ilene Qua, MD      . ezetimibe (ZETIA) tablet 10 mg  10 mg Oral Daily Opyd, Ilene Qua, MD      . gabapentin (NEURONTIN) capsule 300-600 mg  300-600 mg Oral QHS Opyd, Ilene Qua, MD   300 mg at 04/23/17 0032  . hydrALAZINE (APRESOLINE) injection 10 mg  10 mg  Intravenous Q4H PRN Opyd, Ilene Qua, MD      . HYDROcodone-acetaminophen (NORCO/VICODIN) 5-325 MG per tablet 1 tablet  1 tablet Oral Q6H PRN Opyd, Ilene Qua, MD      . lanthanum (FOSRENOL) chewable tablet 3,000 mg  3,000 mg Oral BID WC Opyd, Ilene Qua, MD      . methimazole (TAPAZOLE) tablet 5 mg  5 mg Oral Daily Opyd, Ilene Qua, MD      . Derrill Memo ON 04/24/2017] metoprolol tartrate (LOPRESSOR) tablet 25 mg  25 mg Oral 2 times per day on Sun Mon Wed Fri Opyd, Timothy S, MD      . midodrine (PROAMATINE) tablet 10 mg  10 mg Oral Q T,Th,Sa-HD Opyd, Ilene Qua, MD      . multivitamin (RENA-VIT) tablet 1 tablet  1 tablet Oral QHS Opyd, Ilene Qua, MD      . ondansetron (ZOFRAN) tablet 4 mg  4 mg Oral Q6H PRN Opyd, Ilene Qua, MD       Or  . ondansetron (ZOFRAN) injection 4 mg  4 mg Intravenous Q6H PRN Opyd, Ilene Qua, MD      . pantoprazole (PROTONIX) EC tablet 40 mg  40 mg Oral BID Opyd, Ilene Qua, MD   40 mg at 04/22/17 2310  . sodium chloride flush (NS) 0.9 % injection 3 mL  3 mL Intravenous Q12H Opyd, Ilene Qua, MD   3 mL at 04/22/17 2310  . sodium chloride flush (NS) 0.9 % injection 3 mL  3 mL Intravenous Q12H Opyd, Timothy S, MD      . sodium chloride flush (NS) 0.9 % injection 3 mL  3 mL Intravenous PRN Opyd, Ilene Qua, MD        ROS: As per HPI otherwise negative.  Physical Exam: Vitals:   04/22/17 2230 04/23/17 0517 04/23/17 0919 04/23/17 0931  BP: (!) 183/87 117/89 (!) 171/76 (!) 148/75  Pulse: 70 74 74 74  Resp: 14 16 15 17   Temp: 98.1 F (36.7 C) 97.7 F (36.5 C) 98.1 F (36.7 C)   TempSrc: Oral Oral Oral    SpO2: 100% 95% 96%   Weight:  105.5 kg (232 lb 9.6 oz) 105.8 kg (233 lb 4 oz)   Height:         General: Obese AAM NAD Head: NCAT sclera not icteric MMM Neck: Supple. No JVD No masses Lungs: CTA bilaterally without wheezes, rales, or rhonchi. Breathing is unlabored. Heart: RRR with systolic murmur  Abdomen: soft NT + BS Lower extremities:without edema or ischemic changes, no open wounds  Neuro: A & O  X 3. Moves all extremities spontaneously. Psych:  Responds to questions appropriately with a normal affect. Dialysis Access: RUE AVF cannulated on HD   Labs: Basic Metabolic Panel:  Recent Labs Lab 04/22/17 1245 04/23/17 0353  NA 142 140  K 4.6 4.7  CL 104 103  CO2 22 25  GLUCOSE 139* 143*  BUN 63* 74*  CREATININE 11.20* 12.18*  CALCIUM 8.9 8.4*   Liver Function Tests:  Recent Labs Lab 04/22/17 1245  AST 22  ALT 19  ALKPHOS 88  BILITOT 0.6  PROT 6.8  ALBUMIN 3.5    Recent Labs Lab 04/22/17 1245  LIPASE 50   No results for input(s): AMMONIA in the last 168 hours. CBC:  Recent Labs Lab 04/22/17 1245 04/23/17 0353  WBC 6.8 6.9  HGB 10.0* 9.2*  HCT 31.8* 29.5*  MCV 93.0 93.4  PLT 243 224   Cardiac Enzymes: No  results for input(s): CKTOTAL, CKMB, CKMBINDEX, TROPONINI in the last 168 hours. CBG:  Recent Labs Lab 04/22/17 2247  GLUCAP 149*   Iron Studies: No results for input(s): IRON, TIBC, TRANSFERRIN, FERRITIN in the last 72 hours. Studies/Results: Ct Abdomen Pelvis Wo Contrast  Result Date: 04/22/2017 CLINICAL DATA:  Rectal bleeding and weakness for several weeks, initial encounter EXAM: CT ABDOMEN AND PELVIS WITHOUT CONTRAST TECHNIQUE: Multidetector CT imaging of the abdomen and pelvis was performed following the standard protocol without IV contrast. COMPARISON:  10/14/2015 FINDINGS: Lower chest: Lung bases are well aerated. Only minimal dependent atelectatic changes are seen. Some mild bronchiectasis is noted in the right lower lobe.  Hepatobiliary: Gallbladder has been surgically removed. Pneumobilia is noted likely related to prior intervention. Pancreas: Unremarkable. No pancreatic ductal dilatation or surrounding inflammatory changes. Spleen: Normal in size without focal abnormality. Adrenals/Urinary Tract: Kidneys demonstrate bilateral cystic change and atrophy consistent with the given clinical history of end-stage renal disease. A few tiny nonobstructing renal stones are seen. The adrenal glands are unremarkable. The bladder is decompressed. Stomach/Bowel: No obstructive changes are seen. No significant inflammatory changes are noted. The appendix is within normal limits. Vascular/Lymphatic: Aortic atherosclerosis. No enlarged abdominal or pelvic lymph nodes. Reproductive: Prostate is unremarkable. Other: No abdominal wall hernia or abnormality. No abdominopelvic ascites. Musculoskeletal: Degenerative changes of lumbar spine are seen. No acute bony abnormality is noted. Postsurgical changes are also noted in the lower lumbar spine. IMPRESSION: Chronic changes similar to that seen on prior exam. No acute abnormality is identified correspond with patient's given clinical symptomatology. Electronically Signed   By: Inez Catalina M.D.   On: 04/22/2017 20:12   Dg Chest 2 View  Result Date: 04/22/2017 CLINICAL DATA:  Patient states that he is having burning feeling in chest and very weak, states he has blood coming out of his rectum, that is a dark red and bright red. Hx of hyperlipidemia, NSTEMI, prostate cancer, diabetes, hypertension. EXAM: CHEST  2 VIEW COMPARISON:  Chest x-ray dated 12/26/2016. Chest x-ray dated 08/04/2016. FINDINGS: Heart size and mediastinal contours are within normal limits. Aortic atherosclerosis. Mild scarring in the left mid lung. Lungs otherwise clear. No pleural effusion or pneumothorax seen. No acute or suspicious osseous finding. IMPRESSION: No active cardiopulmonary disease. No evidence of pneumonia or  pulmonary edema. Aortic atherosclerosis. Electronically Signed   By: Franki Cabot M.D.   On: 04/22/2017 13:20    Dialysis Orders:  [facility] TTS 4h 180F BFR 450/800 UF Profile 1 Na Linear EDW 106kg (last OP wt 105.6kg)  R AVF No Heparin -Mircera 53mcg q 4 weeks (last 6/16) -Venofer 50mg  IV q week (7/19) - [name] 6 mcg IV q HD  BMM: Fosrenol 1000 2  tid/ Ca acetate 1 tid / Sensipar 90    Assessment/Plan: 1. BRBPR - Per primary/GI following - currently on Plavix, ASA - per GI will need clearance from cardiology for colonoscopy  2. ESRD -  TTS. HD today on schedule  3. Hypertension/volume  - BP labile, prone to drops on HD, takes midodrine prior/No volume excess on exam and below EDW. UF goal 1L  4. Anemia/Acute GI bleed  - Hgb 10>9.2. Follow trend, transfuse prn. Due for ESA - will give 150 Aranesp with HD today. Continue weekly Fe 5. Metabolic bone disease -  Continue VDRA/binders/Sensipar  6. Nutrition - CL diet currently 7. STEMI s/p s/p LAD PCI stent 2017 (on Plavix, ASA)   Lynnda Child PA-C [facility] Pager [phone] 04/23/2017, 9:58  AM   Pt seen, examined and agree w A/P as above.  Kelly Splinter MD [employer] pager [phone]   04/23/2017, 12:07 PM

## 2017-04-24 ENCOUNTER — Ambulatory Visit: Payer: Medicare Other | Admitting: Physical Therapy

## 2017-04-24 DIAGNOSIS — I10 Essential (primary) hypertension: Secondary | ICD-10-CM

## 2017-04-24 DIAGNOSIS — Z955 Presence of coronary angioplasty implant and graft: Secondary | ICD-10-CM

## 2017-04-24 DIAGNOSIS — I255 Ischemic cardiomyopathy: Secondary | ICD-10-CM

## 2017-04-24 LAB — CBC
HCT: 29 % — ABNORMAL LOW (ref 39.0–52.0)
Hemoglobin: 9.3 g/dL — ABNORMAL LOW (ref 13.0–17.0)
MCH: 29.3 pg (ref 26.0–34.0)
MCHC: 32.1 g/dL (ref 30.0–36.0)
MCV: 91.5 fL (ref 78.0–100.0)
PLATELETS: 242 10*3/uL (ref 150–400)
RBC: 3.17 MIL/uL — AB (ref 4.22–5.81)
RDW: 18.8 % — AB (ref 11.5–15.5)
WBC: 6.1 10*3/uL (ref 4.0–10.5)

## 2017-04-24 LAB — GLUCOSE, CAPILLARY: Glucose-Capillary: 131 mg/dL — ABNORMAL HIGH (ref 65–99)

## 2017-04-24 NOTE — Progress Notes (Signed)
PROGRESS NOTE   [name]  UEA:[id number]    DOB: Jan 17, 1951    DOA: 04/22/2017  PCP: Biagio Borg, MD   I have briefly reviewed patients previous medical records in [facility].  Brief Narrative:  66 year old male with PMH of ESRD on TTS HD, CAD, HTN, chronic diastolic CHF, DM 2, HLD, chronic GI bleeding followed by [facility], OSA on CPAP, presented to ED on 04/22/17 due to worsening of his chronic rectal bleeding and some chest discomfort. Last dose of aspirin and Plavix 04/22/17-now on hold for colonoscopy, scheduled for 04/26/17. [facility]/cardiology/renal following.   Assessment & Plan:   Principal Problem:   Acute GI bleeding Active Problems:   Dementia   Depression   Gastro-esophageal reflux disease without esophagitis   Ischemic cardiomyopathy   Hyperparathyroidism, secondary ([name])   CAD S/P percutaneous coronary angioplasty   OSA (obstructive sleep apnea)   Chronic systolic heart failure ([name])   Neuropathy due to secondary diabetes mellitus ([name])   Chest pain   Malignant neoplasm of prostate ([name])   DM (diabetes mellitus) type II controlled with renal manifestation ([name])   Status post coronary artery stent placement   Hematochezia   ESRD on hemodialysis ([name])   Essential hypertension   History of PSVT (paroxysmal supraventricular tachycardia)   Chronic GI bleeding   1. Chronic and intermittent lower GI bleed: Ongoing for the last 2 months. Painless bleeding. CT abdomen and pelvis without contrast showed no acute findings. [facility] consultation and follow-up appreciated. Discussed with Dr. Salley Hews. History of? Radiation for prostate cancer and hence DD includes radiation proctitis versus hemorrhoids versus AVM as other etiologies. Aspirin and Plavix on hold. No further BM/bleeding for the last 48 hours. Colonoscopy planned for 04/26/17. 2. ESRD on TTS HD: Dialyzed 7/24 and next HD on 7/26. Nephrology plans dialysis without heparin. 3. Essential hypertension:  Controlled. As per nephrology, blood pressure is labile and prone to drop on HD, takes midodrine prior on HD days. 4. Anemia: Likely due to chronic kidney disease and may have an element of mild acute blood loss. Hemoglobin stable in the low 9 g range for the last 2 days. Follow CBC in a.m. across HD. Transfuse as needed to keep hemoglobin >8. 5. CAD/stable angina: EKG without acute findings and troponin negative. Cardiology follow-up appreciated. Low-dose nitrate added. This will need to be held on HD days given history of hypotension at dialysis. Not ideal for Ranexa given ESRD and mildly prolonged QRS. DAPT held for GI workup and plan to resume post colonoscopy when okay with GI. Continue beta blocker sensitivity up. 6. Hyperlipidemia: Statin intolerant. Continue Zetia. 7. Type II DM: Monitor CBGs and use SSI as needed. 8. OSA: Continue nightly CPAP. 9. Hyperthyroid: Clinically euthyroid. Continue methimazole. 10. GERD: PPI. 11. Depression: Stable. Continue Celexa.   DVT prophylaxis: SCDs Code Status: Full Family Communication: None at bedside Disposition: DC home pending completion of workup and medical improvement.   Consultants:  [facility] Nephrology   Procedures:  HD CPAP   Antimicrobials:  None    Subjective: Seen this morning. Upset about being on clear liquids and wanted to know if he can have solid food. Wondering why there was a delay in colonoscopy-explained need to wait for Plavix to be cleared from system in case biopsies were needed. He verbalized understanding.   ROS: No chest pain, dyspnea, dizziness or lightheadedness reported.  Objective:  Vitals:   04/23/17 1953 04/24/17 0305 04/24/17 0537 04/24/17 1144  BP: 136/71 (!) 105/58 110/63 (!) 121/55  Pulse: 72  82 77  Resp: 16  15   Temp: 98 F (36.7 C)  97.9 F (36.6 C) 97.8 F (36.6 C)  TempSrc: Oral  Oral Oral  SpO2: 98%  96% 96%  Weight:   104.1 kg (229 lb 8 oz)   Height:         Examination:  General exam: Pleasant middle-aged male lying comfortably supine in bed. Respiratory system: Clear to auscultation. Respiratory effort normal. Cardiovascular system: S1 & S2 heard, RRR. No JVD, murmurs, rubs, gallops or clicks. No pedal edema. Telemetry: Sinus rhythm. Gastrointestinal system: Abdomen is nondistended, soft and nontender. No organomegaly or masses felt. Normal bowel sounds heard. Central nervous system: Alert and oriented. No focal neurological deficits. Extremities: Symmetric 5 x 5 power. Right arm AV fistula thrill +. Skin: No rashes, lesions or ulcers Psychiatry: Judgement and insight appear normal. Mood & affect appropriate.     Data Reviewed: I have personally reviewed following labs and imaging studies  CBC:  Recent Labs Lab 04/22/17 1245 04/23/17 0353 04/24/17 0730  WBC 6.8 6.9 6.1  HGB 10.0* 9.2* 9.3*  HCT 31.8* 29.5* 29.0*  MCV 93.0 93.4 91.5  PLT 243 224 073   Basic Metabolic Panel:  Recent Labs Lab 04/22/17 1245 04/23/17 0353  NA 142 140  K 4.6 4.7  CL 104 103  CO2 22 25  GLUCOSE 139* 143*  BUN 63* 74*  CREATININE 11.20* 12.18*  CALCIUM 8.9 8.4*   Liver Function Tests:  Recent Labs Lab 04/22/17 1245  AST 22  ALT 19  ALKPHOS 88  BILITOT 0.6  PROT 6.8  ALBUMIN 3.5   Coagulation Profile:  Recent Labs Lab 04/22/17 1245  INR 0.95   CBG:  Recent Labs Lab 04/22/17 2247  GLUCAP 149*          Radiology Studies: Ct Abdomen Pelvis Wo Contrast  Result Date: 04/22/2017 CLINICAL DATA:  Rectal bleeding and weakness for several weeks, initial encounter EXAM: CT ABDOMEN AND PELVIS WITHOUT CONTRAST TECHNIQUE: Multidetector CT imaging of the abdomen and pelvis was performed following the standard protocol without IV contrast. COMPARISON:  10/14/2015 FINDINGS: Lower chest: Lung bases are well aerated. Only minimal dependent atelectatic changes are seen. Some mild bronchiectasis is noted in the right lower lobe.  Hepatobiliary: Gallbladder has been surgically removed. Pneumobilia is noted likely related to prior intervention. Pancreas: Unremarkable. No pancreatic ductal dilatation or surrounding inflammatory changes. Spleen: Normal in size without focal abnormality. Adrenals/Urinary Tract: Kidneys demonstrate bilateral cystic change and atrophy consistent with the given clinical history of end-stage renal disease. A few tiny nonobstructing renal stones are seen. The adrenal glands are unremarkable. The bladder is decompressed. Stomach/Bowel: No obstructive changes are seen. No significant inflammatory changes are noted. The appendix is within normal limits. Vascular/Lymphatic: Aortic atherosclerosis. No enlarged abdominal or pelvic lymph nodes. Reproductive: Prostate is unremarkable. Other: No abdominal wall hernia or abnormality. No abdominopelvic ascites. Musculoskeletal: Degenerative changes of lumbar spine are seen. No acute bony abnormality is noted. Postsurgical changes are also noted in the lower lumbar spine. IMPRESSION: Chronic changes similar to that seen on prior exam. No acute abnormality is identified correspond with patient's given clinical symptomatology. Electronically Signed   By: Inez Catalina M.D.   On: 04/22/2017 20:12        Scheduled Meds: . allopurinol  100 mg Oral Daily  . cinacalcet  90 mg Oral BID WC  . citalopram  10 mg Oral Once  per day on Mon Wed Fri  . colesevelam  625 mg Oral BID WC  . darbepoetin (ARANESP) injection - DIALYSIS  150 mcg Intravenous Q Tue-HD  . doxercalciferol  6 mcg Intravenous Q T,Th,Sa-HD  . ezetimibe  10 mg Oral Daily  . gabapentin  300-600 mg Oral QHS  . isosorbide mononitrate  15 mg Oral Q M,W,F,Su-1800  . lanthanum  3,000 mg Oral BID WC  . methimazole  5 mg Oral Daily  . metoprolol tartrate  25 mg Oral 2 times per day on Sun Mon Wed Fri  . midodrine  10 mg Oral Q T,Th,Sa-HD  . multivitamin  1 tablet Oral QHS  . pantoprazole  40 mg Oral BID  . sodium  chloride flush  3 mL Intravenous Q12H  . sodium chloride flush  3 mL Intravenous Q12H   Continuous Infusions: . sodium chloride    . [START ON 04/25/2017] ferric gluconate (FERRLECIT/NULECIT) IV       LOS: 2 days     Laksh Hinners, MD, FACP, FHM. [facility] Pager [phone]  If 7PM-7AM, please contact night-coverage [email or web address] Password [facility] 04/24/2017, 4:37 PM

## 2017-04-24 NOTE — Progress Notes (Signed)
[facility] Progress Note  Subjective: no c/o  Vitals:   04/23/17 1339 04/23/17 1953 04/24/17 0305 04/24/17 0537  BP: 120/66 136/71 (!) 105/58 110/63  Pulse: 72 72  82  Resp: 16 16  15   Temp: 97.9 F (36.6 C) 98 F (36.7 C)  97.9 F (36.6 C)  TempSrc: Oral Oral  Oral  SpO2: 96% 98%  96%  Weight: 104.7 kg (230 lb 13.2 oz)   104.1 kg (229 lb 8 oz)  Height:        Inpatient medications: . allopurinol  100 mg Oral Daily  . cinacalcet  90 mg Oral BID WC  . citalopram  10 mg Oral Once per day on Mon Wed Fri  . colesevelam  625 mg Oral BID WC  . darbepoetin (ARANESP) injection - DIALYSIS  150 mcg Intravenous Q Tue-HD  . doxercalciferol  6 mcg Intravenous Q T,Th,Sa-HD  . ezetimibe  10 mg Oral Daily  . gabapentin  300-600 mg Oral QHS  . isosorbide mononitrate  15 mg Oral Q M,W,F,Su-1800  . lanthanum  3,000 mg Oral BID WC  . methimazole  5 mg Oral Daily  . metoprolol tartrate  25 mg Oral 2 times per day on Sun Mon Wed Fri  . midodrine  10 mg Oral Q T,Th,Sa-HD  . multivitamin  1 tablet Oral QHS  . pantoprazole  40 mg Oral BID  . sodium chloride flush  3 mL Intravenous Q12H  . sodium chloride flush  3 mL Intravenous Q12H   . sodium chloride    . [START ON 04/25/2017] ferric gluconate (FERRLECIT/NULECIT) IV     sodium chloride, acetaminophen **OR** acetaminophen, hydrALAZINE, HYDROcodone-acetaminophen, ondansetron **OR** ondansetron (ZOFRAN) IV, sodium chloride flush  Exam: General: Obese AAM NAD Head: NCAT sclera not icteric MMM Neck: Supple. No JVD No masses Lungs: CTA bilaterally without wheezes, rales, or rhonchi. Breathing is unlabored. Heart: RRR with systolic murmur  Abdomen: soft NT + BS Lower extremities:without edema or ischemic changes, no open wounds  Neuro: A & O  X 3. Moves all extremities spontaneously. Psych:  Responds to questions appropriately with a normal affect. Dialysis Access: RUE AVF cannulated on HD   Dialysis:  [facility] TTS 4h 180F BFR  450/800  106kg    R AVF No Heparin - Mircera 56mcg q 4 weeks (last 6/16) - Venofer 50mg  IV q week (7/19) - [name] 6 mcg IV q HD  BMM: Fosrenol 1000 2  tid/ Ca acetate 1 tid / Sensipar 90    Impression/Plan: 1. BRBPR - Per primary/GI following - currently on Plavix, ASA - per GI will need clearance from cardiology for colonoscopy  2. ESRD -  TTS HD. HD tomorrow. No heparin.  3. Hypertension/volume  - BP labile, prone to drops on HD, takes midodrine prior/No volume excess on exam and below EDW. Min UF w/ HD tomorrow, lower dry wt possibly at dc 1-2kg.  4. Anemia/Acute GI bleed  - Hgb 10>9.2. Follow trend, transfuse prn. Due for ESA - got 150 Aranesp with HD tuesdayContinue weekly Fe 5. Metabolic bone disease -  Continue VDRA/binders/Sensipar  6. Nutrition - CL diet currently 7. STEMI s/p s/p LAD PCI stent 2017 (on Plavix, ASA)     Kelly Splinter MD [facility] pager [phone]   04/24/2017, 11:01 AM    Recent Labs Lab 04/22/17 1245 04/23/17 0353  NA 142 140  K 4.6 4.7  CL 104 103  CO2 22 25  GLUCOSE 139* 143*  BUN 63*  74*  CREATININE 11.20* 12.18*  CALCIUM 8.9 8.4*    Recent Labs Lab 04/22/17 1245  AST 22  ALT 19  ALKPHOS 88  BILITOT 0.6  PROT 6.8  ALBUMIN 3.5    Recent Labs Lab 04/22/17 1245 04/23/17 0353 04/24/17 0730  WBC 6.8 6.9 6.1  HGB 10.0* 9.2* 9.3*  HCT 31.8* 29.5* 29.0*  MCV 93.0 93.4 91.5  PLT 243 224 242   Iron/TIBC/Ferritin/ %Sat    Component Value Date/Time   IRON 42 10/24/2016 1611   TIBC 246 (L) 10/17/2016 1103   FERRITIN 709 (H) 10/17/2016 1103   IRONPCTSAT 14.9 (L) 10/24/2016 1611

## 2017-04-24 NOTE — Plan of Care (Addendum)
Pt's wife with many questions at change of shift last evening regarding plan of care (plavix, scheduling of colonoscopy, dialysis) going forward. Wife informed that MD's will be rounding in the AM. Will continue to monitor.

## 2017-04-24 NOTE — Progress Notes (Signed)
[facility] Gastroenterology Progress Note  [name] 66 y.o. 10-04-1950  CC:  Lower GI bleed   Subjective: Patient has no further bowel movement. Denied abdominal pain. Wants to eat regular diet.  ROS : Negative for chest pain and shortness of breath.   Objective: Vital signs in last 24 hours: Vitals:   04/24/17 0305 04/24/17 0537  BP: (!) 105/58 110/63  Pulse:  82  Resp:  15  Temp:  97.9 F (36.6 C)    Physical Exam:  General:   Alert,  Well-developed, well-nourished, pleasant and cooperative in NAD HEENT : Normocephalic, atraumatic, extraocular movement intact. Moist mucosa. Lungs:  Clear throughout to auscultation.   No wheezes, crackles, or rhonchi. No acute distress. Heart:  Regular rate and rhythm; no murmurs, clicks, rubs,  or gallops. Abdomen: Soft, nontender, nondistended, bowel sounds present. No peritoneal signs Lower extremities And no edema. Pulses intact Psych :  Alert oriented 3. Mood and affect normal    Lab Results:  Recent Labs  04/22/17 1245 04/23/17 0353  NA 142 140  K 4.6 4.7  CL 104 103  CO2 22 25  GLUCOSE 139* 143*  BUN 63* 74*  CREATININE 11.20* 12.18*  CALCIUM 8.9 8.4*    Recent Labs  04/22/17 1245  AST 22  ALT 19  ALKPHOS 88  BILITOT 0.6  PROT 6.8  ALBUMIN 3.5    Recent Labs  04/23/17 0353 04/24/17 0730  WBC 6.9 6.1  HGB 9.2* 9.3*  HCT 29.5* 29.0*  MCV 93.4 91.5  PLT 224 242    Recent Labs  04/22/17 1245  LABPROT 12.6  INR 0.95      Assessment/Plan: - Intermittent lower GI bleed since last 2 months. Painless bleeding. History of questionable radiation for prostate cancer. Differential diagnosis include radiation proctitis versus hemorrhoidal bleeding versus AVM - Acute on chronic anemia - Coronary artery disease status post PCI in November 2017. Status post AVNRT ablation in February 2018 - End-stage renal disease on hemodialysis.  Recommendations ------------------------- - Appreciate cardiology  input. Okay to hold aspirin and Plavix from cardiac standpoint for procedure. - tentative Plan for colonoscopy on Friday - Patient's hemoglobin is relatively stable. Okay to have a heart healthy diet today. We will switch to a liquid diet tomorrow. - Monitor H&H. Transfuse to keep hemoglobin more than 8. GI will follow   Otis Brace MD, [place] 04/24/2017, 9:40 AM  Pager [phone]  If no answer or after 5 PM call [phone]

## 2017-04-24 NOTE — Consult Note (Signed)
   [facility] CM Inpatient Consult   04/24/2017  [name] [birth year] [id number]   Patient is currently active with [place] for chronic disease management services.  Patient has been engaged by a [employer].  Our community based plan of care has focused on disease management and community resource support.  Patient will receive a post discharge transition of care call and will be evaluated for monthly home visits for assessments and disease process education.  Came by to speak with the patient on rounds and patient was sound asleep, room darkened, all lights out.  Did not disturb, will follow for progress and disposition needs.  Will follow up with [facility].  Made Inpatient Case Manager aware that [place] [facility] following. Of note, [facility] Care Management services does not replace or interfere with any services that are needed or arranged by inpatient case management or social work.  For additional questions or referrals please contact:  Natividad Brood, RN BSN [facility] Liaison  [phone] business mobile phone Toll free office [phone]

## 2017-04-24 NOTE — Progress Notes (Signed)
Progress Note  Patient Name: [name] Date of Encounter: 04/24/2017  Primary Cardiologist: Dr Curt Bears, Dr [name]  Subjective   Tire- sleeping with C-pap on when I came in. No chest pain  Inpatient Medications    Scheduled Meds: . allopurinol  100 mg Oral Daily  . cinacalcet  90 mg Oral BID WC  . citalopram  10 mg Oral Once per day on Mon Wed Fri  . colesevelam  625 mg Oral BID WC  . darbepoetin (ARANESP) injection - DIALYSIS  150 mcg Intravenous Q Tue-HD  . doxercalciferol  6 mcg Intravenous Q T,Th,Sa-HD  . ezetimibe  10 mg Oral Daily  . gabapentin  300-600 mg Oral QHS  . isosorbide mononitrate  15 mg Oral Q M,W,F,Su-1800  . lanthanum  3,000 mg Oral BID WC  . methimazole  5 mg Oral Daily  . metoprolol tartrate  25 mg Oral 2 times per day on Sun Mon Wed Fri  . midodrine  10 mg Oral Q T,Th,Sa-HD  . multivitamin  1 tablet Oral QHS  . pantoprazole  40 mg Oral BID  . sodium chloride flush  3 mL Intravenous Q12H  . sodium chloride flush  3 mL Intravenous Q12H   Continuous Infusions: . sodium chloride    . [START ON 04/25/2017] ferric gluconate (FERRLECIT/NULECIT) IV     PRN Meds: sodium chloride, acetaminophen **OR** acetaminophen, hydrALAZINE, HYDROcodone-acetaminophen, ondansetron **OR** ondansetron (ZOFRAN) IV, sodium chloride flush   Vital Signs    Vitals:   04/23/17 1339 04/23/17 1953 04/24/17 0305 04/24/17 0537  BP: 120/66 136/71 (!) 105/58 110/63  Pulse: 72 72  82  Resp: 16 16  15   Temp: 97.9 F (36.6 C) 98 F (36.7 C)  97.9 F (36.6 C)  TempSrc: Oral Oral  Oral  SpO2: 96% 98%  96%  Weight: 230 lb 13.2 oz (104.7 kg)   229 lb 8 oz (104.1 kg)  Height:        Intake/Output Summary (Last 24 hours) at 04/24/17 1118 Last data filed at 04/24/17 0110  Gross per 24 hour  Intake              480 ml  Output             1000 ml  Net             -520 ml   Filed Weights   04/23/17 0919 04/23/17 1339 04/24/17 0537  Weight: 233 lb 4 oz (105.8 kg) 230 lb  13.2 oz (104.7 kg) 229 lb 8 oz (104.1 kg)    Telemetry    NSR - Personally Reviewed  ECG     04/22/17 NSR- Personally Reviewed  Physical Exam   GEN: Obese, No acute distress.   Neck: No JVD Cardiac:  RRR, no murmurs, rubs, or gallops. Respiratory:  Clear to auscultation bilaterally. GI: Soft, nontender, non-distended  MS: No edema; No deformity. Neuro:  Nonfocal  Psych: Normal affect   Labs    Chemistry Recent Labs Lab 04/22/17 1245 04/23/17 0353  NA 142 140  K 4.6 4.7  CL 104 103  CO2 22 25  GLUCOSE 139* 143*  BUN 63* 74*  CREATININE 11.20* 12.18*  CALCIUM 8.9 8.4*  PROT 6.8  --   ALBUMIN 3.5  --   AST 22  --   ALT 19  --   ALKPHOS 88  --   BILITOT 0.6  --   GFRNONAA 4* 4*  GFRAA 5* 4*  ANIONGAP 16* 12  Hematology Recent Labs Lab 04/22/17 1245 04/23/17 0353 04/24/17 0730  WBC 6.8 6.9 6.1  RBC 3.42* 3.16* 3.17*  HGB 10.0* 9.2* 9.3*  HCT 31.8* 29.5* 29.0*  MCV 93.0 93.4 91.5  MCH 29.2 29.1 29.3  MCHC 31.4 31.2 32.1  RDW 19.2* 19.4* 18.8*  PLT 243 224 242    Cardiac EnzymesNo results for input(s): TROPONINI in the last 168 hours.  Recent Labs Lab 04/22/17 1252 04/22/17 1724  TROPIPOC 0.03 0.02     BNPNo results for input(s): BNP, PROBNP in the last 168 hours.   DDimer No results for input(s): DDIMER in the last 168 hours.   Radiology    Ct Abdomen Pelvis Wo Contrast  Result Date: 04/22/2017 CLINICAL DATA:  Rectal bleeding and weakness for several weeks, initial encounter EXAM: CT ABDOMEN AND PELVIS WITHOUT CONTRAST TECHNIQUE: Multidetector CT imaging of the abdomen and pelvis was performed following the standard protocol without IV contrast. COMPARISON:  10/14/2015 FINDINGS: Lower chest: Lung bases are well aerated. Only minimal dependent atelectatic changes are seen. Some mild bronchiectasis is noted in the right lower lobe. Hepatobiliary: Gallbladder has been surgically removed. Pneumobilia is noted likely related to prior  intervention. Pancreas: Unremarkable. No pancreatic ductal dilatation or surrounding inflammatory changes. Spleen: Normal in size without focal abnormality. Adrenals/Urinary Tract: Kidneys demonstrate bilateral cystic change and atrophy consistent with the given clinical history of end-stage renal disease. A few tiny nonobstructing renal stones are seen. The adrenal glands are unremarkable. The bladder is decompressed. Stomach/Bowel: No obstructive changes are seen. No significant inflammatory changes are noted. The appendix is within normal limits. Vascular/Lymphatic: Aortic atherosclerosis. No enlarged abdominal or pelvic lymph nodes. Reproductive: Prostate is unremarkable. Other: No abdominal wall hernia or abnormality. No abdominopelvic ascites. Musculoskeletal: Degenerative changes of lumbar spine are seen. No acute bony abnormality is noted. Postsurgical changes are also noted in the lower lumbar spine. IMPRESSION: Chronic changes similar to that seen on prior exam. No acute abnormality is identified correspond with patient's given clinical symptomatology. Electronically Signed   By: Inez Catalina M.D.   On: 04/22/2017 20:12   Dg Chest 2 View  Result Date: 04/22/2017 CLINICAL DATA:  Patient states that he is having burning feeling in chest and very weak, states he has blood coming out of his rectum, that is a dark red and bright red. Hx of hyperlipidemia, NSTEMI, prostate cancer, diabetes, hypertension. EXAM: CHEST  2 VIEW COMPARISON:  Chest x-ray dated 12/26/2016. Chest x-ray dated 08/04/2016. FINDINGS: Heart size and mediastinal contours are within normal limits. Aortic atherosclerosis. Mild scarring in the left mid lung. Lungs otherwise clear. No pleural effusion or pneumothorax seen. No acute or suspicious osseous finding. IMPRESSION: No active cardiopulmonary disease. No evidence of pneumonia or pulmonary edema. Aortic atherosclerosis. Electronically Signed   By: Franki Cabot M.D.   On: 04/22/2017  13:20    Cardiac Studies   Echo 10/17/16- Study Conclusions  - Left ventricle: The cavity size was normal. Systolic function was   mildly to moderately reduced. The estimated ejection fraction was   in the range of 40% to 45%. Wall motion was normal; there were no   regional wall motion abnormalities. Features are consistent with   a pseudonormal left ventricular filling pattern, with concomitant   abnormal relaxation and increased filling pressure (grade 2   diastolic dysfunction). Doppler parameters are consistent with   elevated ventricular end-diastolic filling pressure. - Aortic valve: There was no regurgitation. - Aortic root: The aortic root was normal in size. -  Mitral valve: There was trivial regurgitation. - Left atrium: The atrium was mildly dilated. - Right ventricle: Systolic function was normal. - Right atrium: The atrium was normal in size. - Pulmonary arteries: Systolic pressure was within the normal   range. - Inferior vena cava: The vessel was normal in size. - Pericardium, extracardiac: There was no pericardial effusion.  Impressions:  - Compared to the prior study on 01/12/2015 LVEF decreased,   previously 55%, now 40-45%, there is basal inferior wall   hypokinesis.  Patient Profile     66 y.o. male with a history of CAD s/p prior RCA and recent LAD stenting Nov 2017, HTN, HL, DMII, combined CHF, ICM 40-45%, PSVT s/p RFA Feb 2018, and ESRD on HD, who was admitted 04/22/17 for the evaluation BRBPR and chest pain in the setting of DAPT   Assessment & Plan    1.  Intermittent GI Bleed/BRBPR:  Hgb stable. Plavix and ASA on hold for colonoscopy planned for Friday.   2.  CAD/Stable Angina:  Pt is s/p RCA DES in 2008 and LAD DES in 08/2016.  Residual RCA dzs, which is not felt to be amenable to PCI.  In the setting of BRBPR and mild, stable anemia.  ECG non-acute and troponins neg.  Low dose nitrate added. Will need to hold this on HD days given h/o orthostasis  req midodrine on T/T/S.  Not ideal for ranexa given ESRD and mildly prolonged QT.  Hold DAPT while undergoing GI w/u, with plan to resume when ok to do so.  Cont ? blocker and zetia.  3.  Essential HTN:  BPs quite variable in setting of orthostasis on HD days.  Cont ? blocker.   4.  HL:  Statin intolerant.  Cont zetia.  5.  DM II:  Per IM.  6.  ESRD:  HD per nephrology.   Plan: As above- colonoscopy Friday.  SignedKerin Ransom, PA-C  04/24/2017, 11:18 AM    Patient seen and examined. Agree with assessment and plan. No chest pain or dyspnea. C/o weakness.  Last dose of Plavix on 7/23. Plan for colonoscopy on Friday 7/27. H/H today 9.3/29. BP stable today at 121/55, a non dialysis day.   Troy Sine, MD, [facility] 04/24/2017 1:33 PM

## 2017-04-25 DIAGNOSIS — Z9861 Coronary angioplasty status: Secondary | ICD-10-CM

## 2017-04-25 DIAGNOSIS — I2510 Atherosclerotic heart disease of native coronary artery without angina pectoris: Secondary | ICD-10-CM

## 2017-04-25 LAB — RENAL FUNCTION PANEL
ANION GAP: 12 (ref 5–15)
Albumin: 3.1 g/dL — ABNORMAL LOW (ref 3.5–5.0)
BUN: 46 mg/dL — ABNORMAL HIGH (ref 6–20)
CHLORIDE: 96 mmol/L — AB (ref 101–111)
CO2: 27 mmol/L (ref 22–32)
Calcium: 8.3 mg/dL — ABNORMAL LOW (ref 8.9–10.3)
Creatinine, Ser: 10.71 mg/dL — ABNORMAL HIGH (ref 0.61–1.24)
GFR, EST AFRICAN AMERICAN: 5 mL/min — AB (ref >60)
GFR, EST NON AFRICAN AMERICAN: 4 mL/min — AB (ref >60)
Glucose, Bld: 136 mg/dL — ABNORMAL HIGH (ref 65–99)
POTASSIUM: 4.4 mmol/L (ref 3.5–5.1)
Phosphorus: 7.2 mg/dL — ABNORMAL HIGH (ref 2.5–4.6)
Sodium: 135 mmol/L (ref 135–145)

## 2017-04-25 LAB — CBC
HEMATOCRIT: 28.4 % — AB (ref 39.0–52.0)
HEMOGLOBIN: 8.9 g/dL — AB (ref 13.0–17.0)
MCH: 28.7 pg (ref 26.0–34.0)
MCHC: 31.3 g/dL (ref 30.0–36.0)
MCV: 91.6 fL (ref 78.0–100.0)
Platelets: 254 10*3/uL (ref 150–400)
RBC: 3.1 MIL/uL — ABNORMAL LOW (ref 4.22–5.81)
RDW: 18.9 % — ABNORMAL HIGH (ref 11.5–15.5)
WBC: 6.1 10*3/uL (ref 4.0–10.5)

## 2017-04-25 LAB — GLUCOSE, CAPILLARY
Glucose-Capillary: 156 mg/dL — ABNORMAL HIGH (ref 65–99)
Glucose-Capillary: 133 mg/dL — ABNORMAL HIGH (ref 65–99)
Glucose-Capillary: 127 mg/dL — ABNORMAL HIGH (ref 65–99)

## 2017-04-25 MED ORDER — SODIUM CHLORIDE 0.9 % IV SOLN
INTRAVENOUS | Status: DC
  Administered 2017-04-26: 09:00:00 via INTRAVENOUS

## 2017-04-25 MED ORDER — LIDOCAINE-PRILOCAINE 2.5-2.5 % EX CREA: 1.0000 "application " | TOPICAL_CREAM | CUTANEOUS | Status: DC | PRN

## 2017-04-25 MED ORDER — PEG 3350-KCL-NA BICARB-NACL 420 G PO SOLR
4000.0000 mL | Freq: Once | ORAL | Status: AC
  Administered 2017-04-25: 4000 mL via ORAL
  Filled 2017-04-25: qty 4000

## 2017-04-25 MED ORDER — MIDODRINE HCL 5 MG PO TABS
ORAL_TABLET | ORAL | Status: AC
  Filled 2017-04-25: qty 2

## 2017-04-25 MED ORDER — SODIUM CHLORIDE 0.9 % IV SOLN: 100.0000 mL | INTRAVENOUS | Status: DC | PRN

## 2017-04-25 MED ORDER — LIDOCAINE HCL (PF) 1 % IJ SOLN: 5.0000 mL | INTRAMUSCULAR | Status: DC | PRN

## 2017-04-25 MED ORDER — DOXERCALCIFEROL 4 MCG/2ML IV SOLN
INTRAVENOUS | Status: AC
  Filled 2017-04-25: qty 4

## 2017-04-25 MED ORDER — HEPARIN SODIUM (PORCINE) 1000 UNIT/ML DIALYSIS: 1000.0000 [IU] | INTRAMUSCULAR | Status: DC | PRN

## 2017-04-25 MED ORDER — ALTEPLASE 2 MG IJ SOLR: 2.0000 mg | Freq: Once | INTRAMUSCULAR | Status: DC | PRN

## 2017-04-25 MED ORDER — PENTAFLUOROPROP-TETRAFLUOROETH EX AERO: 1.0000 "application " | INHALATION_SPRAY | CUTANEOUS | Status: DC | PRN

## 2017-04-25 NOTE — Progress Notes (Signed)
PROGRESS NOTE   BERNADETTE ARMIJO  DDU:[id number]    DOB: Jan 31, 1951    DOA: 04/22/2017  PCP: Biagio Borg, MD   I have briefly reviewed patients previous medical records in [facility].  Brief Narrative:  66 year old male with PMH of ESRD on TTS HD, CAD, HTN, chronic diastolic CHF, DM 2, HLD, chronic GI bleeding followed by [facility], OSA on CPAP, presented to ED on 04/22/17 due to worsening of his chronic rectal bleeding and some chest discomfort. Last dose of aspirin and Plavix 04/22/17-now on hold for colonoscopy, scheduled for 04/26/17. [facility]/cardiology/renal following.   Assessment & Plan:   Principal Problem:   Acute GI bleeding Active Problems:   Dementia   Depression   Gastro-esophageal reflux disease without esophagitis   Ischemic cardiomyopathy   Hyperparathyroidism, secondary ([name])   CAD S/P percutaneous coronary angioplasty   OSA (obstructive sleep apnea)   Chronic systolic heart failure ([name])   Neuropathy due to secondary diabetes mellitus ([name])   Chest pain   Malignant neoplasm of prostate ([name])   DM (diabetes mellitus) type II controlled with renal manifestation ([name])   Status post coronary artery stent placement   Hematochezia   ESRD on hemodialysis ([name])   Essential hypertension   History of PSVT (paroxysmal supraventricular tachycardia)   Chronic GI bleeding   1. Chronic and intermittent lower GI bleed: Ongoing for the last 2 months. Painless bleeding. CT abdomen and pelvis without contrast showed no acute findings. [facility] consultation and follow-up appreciated. History of? Radiation for prostate cancer and hence DD includes radiation proctitis versus hemorrhoids versus AVM as other etiologies. Aspirin and Plavix on hold> last dose was on 7/23. No further rectal bleeding. Reports normal BM without bleeding yesterday. Colonoscopy planned for 04/26/17. 2. ESRD on TTS HD: Nephrology following for dialysis needs. Seemed this morning at dialysis. 3. Essential  hypertension: Controlled. As per nephrology, blood pressure is labile and prone to drop on HD, takes midodrine prior on HD days. Controlled. 4. Anemia: Likely due to chronic kidney disease and may have an element of mild acute blood loss. Hemoglobin has slightly dropped from 9.3-8.9 but overall stable. Transfuse as needed to keep hemoglobin >8. 5. CAD/stable angina: EKG without acute findings and troponin negative. Cardiology follow-up appreciated. Low-dose nitrate added. This will need to be held on HD days given history of hypotension at dialysis. Not ideal for Ranexa given ESRD and mildly prolonged QRS. DAPT held for GI workup and plan to resume post colonoscopy when okay with GI. Continue beta blocker. 6. Hyperlipidemia: Statin intolerant. Continue Zetia. 7. Type II DM: Monitor CBGs and use SSI as needed. Reasonable inpatient control. 8. OSA: Continue nightly CPAP. 9. Hyperthyroid: Clinically euthyroid. Continue methimazole. 10. GERD: PPI. 11. Depression: Stable. Continue Celexa.   DVT prophylaxis: SCDs Code Status: Full Family Communication: None at bedside Disposition: DC home pending completion of workup and medical improvement.   Consultants:  [facility]   Procedures:  HD CPAP   Antimicrobials:  None    Subjective: Seen this morning across hemodialysis. Reports no further rectal bleeding. Reports normal BM without bleeding yesterday.  ROS: No chest pain, dyspnea, dizziness or lightheadedness reported.  Objective:  Vitals:   04/25/17 1000 04/25/17 1030 04/25/17 1043 04/25/17 1203  BP: 140/60 135/60 140/65 (!) 116/46  Pulse: 64 70 70 79  Resp:   18 18  Temp:   98 F (36.7 C) 98 F (36.7 C)  TempSrc:   Tympanic Oral  SpO2:   98% 98%  Weight:   104.6 kg (230 lb 9.6 oz)   Height:        Examination:  General exam: Pleasant middle-aged male lying comfortably supine in bed undergoing dialysis. Respiratory system: Clear to auscultation.  Respiratory effort normal. Stable without change Cardiovascular system: S1 & S2 heard, RRR. No JVD, murmurs, rubs, gallops or clicks. No pedal edema. Telemetry: Sinus rhythm. Stable without change. Gastrointestinal system: Abdomen is nondistended, soft and nontender. No organomegaly or masses felt. Normal bowel sounds heard. Stable without change. Central nervous system: Alert and oriented. No focal neurological deficits. Extremities: Symmetric 5 x 5 power. Right arm AV fistula thrill +. Skin: No rashes, lesions or ulcers Psychiatry: Judgement and insight appear normal. Mood & affect appropriate.     Data Reviewed: I have personally reviewed following labs and imaging studies  CBC:  Recent Labs Lab 04/22/17 1245 04/23/17 0353 04/24/17 0730 04/25/17 0429  WBC 6.8 6.9 6.1 6.1  HGB 10.0* 9.2* 9.3* 8.9*  HCT 31.8* 29.5* 29.0* 28.4*  MCV 93.0 93.4 91.5 91.6  PLT 243 224 242 732   Basic Metabolic Panel:  Recent Labs Lab 04/22/17 1245 04/23/17 0353 04/25/17 0721  NA 142 140 135  K 4.6 4.7 4.4  CL 104 103 96*  CO2 22 25 27   GLUCOSE 139* 143* 136*  BUN 63* 74* 46*  CREATININE 11.20* 12.18* 10.71*  CALCIUM 8.9 8.4* 8.3*  PHOS  --   --  7.2*   Liver Function Tests:  Recent Labs Lab 04/22/17 1245 04/25/17 0721  AST 22  --   ALT 19  --   ALKPHOS 88  --   BILITOT 0.6  --   PROT 6.8  --   ALBUMIN 3.5 3.1*   Coagulation Profile:  Recent Labs Lab 04/22/17 1245  INR 0.95   CBG:  Recent Labs Lab 04/22/17 2247 04/24/17 2129 04/25/17 1202  GLUCAP 149* 131* 156*          Radiology Studies: No results found.      Scheduled Meds: . allopurinol  100 mg Oral Daily  . cinacalcet  90 mg Oral BID WC  . citalopram  10 mg Oral Once per day on Mon Wed Fri  . colesevelam  625 mg Oral BID WC  . darbepoetin (ARANESP) injection - DIALYSIS  150 mcg Intravenous Q Tue-HD  . doxercalciferol  6 mcg Intravenous Q T,Th,Sa-HD  . ezetimibe  10 mg Oral Daily  .  gabapentin  300-600 mg Oral QHS  . isosorbide mononitrate  15 mg Oral Q M,W,F,Su-1800  . lanthanum  3,000 mg Oral BID WC  . methimazole  5 mg Oral Daily  . metoprolol tartrate  25 mg Oral 2 times per day on Sun Mon Wed Fri  . midodrine  10 mg Oral Q T,Th,Sa-HD  . multivitamin  1 tablet Oral QHS  . pantoprazole  40 mg Oral BID  . sodium chloride flush  3 mL Intravenous Q12H  . sodium chloride flush  3 mL Intravenous Q12H   Continuous Infusions: . sodium chloride    . sodium chloride    . ferric gluconate (FERRLECIT/NULECIT) IV Stopped (04/25/17 0914)     LOS: 3 days     HONGALGI,ANAND, MD, FACP, FHM. [facility] Pager [phone]  If 7PM-7AM, please contact night-coverage [email or web address] Password [email or web address] 04/25/2017, 1:33 PM

## 2017-04-25 NOTE — Procedures (Signed)
Doing well, on HD.  No c/o's.  For colonoscopy on Friday, Plavix held since Monday.  On midodrine pre HD, and on MTP bid for CAD/ HTN.     I was present at this dialysis session, have reviewed the session itself and made  appropriate changes Kelly Splinter MD [place] pager [phone]   04/25/2017, 11:03 AM

## 2017-04-25 NOTE — Progress Notes (Signed)
Progress Note  Patient Name: [name] Date of Encounter: 04/25/2017  Primary Cardiologist: Dr Curt Bears, Dr [name]  Subjective   Pt seen on dialysis. He denies chest pain  Inpatient Medications    Scheduled Meds: . allopurinol  100 mg Oral Daily  . cinacalcet  90 mg Oral BID WC  . citalopram  10 mg Oral Once per day on Mon Wed Fri  . colesevelam  625 mg Oral BID WC  . darbepoetin (ARANESP) injection - DIALYSIS  150 mcg Intravenous Q Tue-HD  . doxercalciferol  6 mcg Intravenous Q T,Th,Sa-HD  . ezetimibe  10 mg Oral Daily  . gabapentin  300-600 mg Oral QHS  . isosorbide mononitrate  15 mg Oral Q M,W,F,Su-1800  . lanthanum  3,000 mg Oral BID WC  . methimazole  5 mg Oral Daily  . metoprolol tartrate  25 mg Oral 2 times per day on Sun Mon Wed Fri  . midodrine  10 mg Oral Q T,Th,Sa-HD  . multivitamin  1 tablet Oral QHS  . pantoprazole  40 mg Oral BID  . sodium chloride flush  3 mL Intravenous Q12H  . sodium chloride flush  3 mL Intravenous Q12H   Continuous Infusions: . sodium chloride    . sodium chloride    . sodium chloride    . ferric gluconate (FERRLECIT/NULECIT) IV 62.5 mg (04/25/17 0814)   PRN Meds: sodium chloride, sodium chloride, sodium chloride, acetaminophen **OR** acetaminophen, alteplase, heparin, hydrALAZINE, HYDROcodone-acetaminophen, lidocaine (PF), lidocaine-prilocaine, ondansetron **OR** ondansetron (ZOFRAN) IV, pentafluoroprop-tetrafluoroeth, sodium chloride flush   Vital Signs    Vitals:   04/25/17 0730 04/25/17 0800 04/25/17 0900 04/25/17 0930  BP: 131/67 129/65 131/62 134/63  Pulse: 67 63 67 61  Resp:      Temp:      TempSrc:      SpO2:      Weight:      Height:        Intake/Output Summary (Last 24 hours) at 04/25/17 0957 Last data filed at 04/25/17 0545  Gross per 24 hour  Intake              360 ml  Output                0 ml  Net              360 ml   Filed Weights   04/24/17 0537 04/25/17 0408 04/25/17 0713  Weight: 229 lb  8 oz (104.1 kg) 232 lb 12.8 oz (105.6 kg) 232 lb 5.8 oz (105.4 kg)    Telemetry    NSR - Personally Reviewed  ECG     04/22/17 NSR- Personally Reviewed  Physical Exam   GEN: Obese, No acute distress.   Neck: No JVD Cardiac:  RRR, no murmurs, rubs, or gallops. Respiratory:  Clear to auscultation bilaterally. GI: Soft, nontender, non-distended  MS: No edema; No deformity. Neuro:  Nonfocal  Psych: Normal affect   Labs    Chemistry  Recent Labs Lab 04/22/17 1245 04/23/17 0353 04/25/17 0721  NA 142 140 135  K 4.6 4.7 4.4  CL 104 103 96*  CO2 22 25 27   GLUCOSE 139* 143* 136*  BUN 63* 74* 46*  CREATININE 11.20* 12.18* 10.71*  CALCIUM 8.9 8.4* 8.3*  PROT 6.8  --   --   ALBUMIN 3.5  --  3.1*  AST 22  --   --   ALT 19  --   --   ALKPHOS  88  --   --   BILITOT 0.6  --   --   GFRNONAA 4* 4* 4*  GFRAA 5* 4* 5*  ANIONGAP 16* 12 12     Hematology  Recent Labs Lab 04/23/17 0353 04/24/17 0730 04/25/17 0429  WBC 6.9 6.1 6.1  RBC 3.16* 3.17* 3.10*  HGB 9.2* 9.3* 8.9*  HCT 29.5* 29.0* 28.4*  MCV 93.4 91.5 91.6  MCH 29.1 29.3 28.7  MCHC 31.2 32.1 31.3  RDW 19.4* 18.8* 18.9*  PLT 224 242 254    Cardiac EnzymesNo results for input(s): TROPONINI in the last 168 hours.   Recent Labs Lab 04/22/17 1252 04/22/17 1724  TROPIPOC 0.03 0.02     BNPNo results for input(s): BNP, PROBNP in the last 168 hours.   DDimer No results for input(s): DDIMER in the last 168 hours.   Radiology    CXR 04/22/17 FINDINGS: Heart size and mediastinal contours are within normal limits. Aortic atherosclerosis.  Mild scarring in the left mid lung. Lungs otherwise clear. No pleural effusion or pneumothorax seen. No acute or suspicious osseous finding.  IMPRESSION: No active cardiopulmonary disease. No evidence of pneumonia or pulmonary edema.   Cardiac Studies   Echo 10/17/16- Study Conclusions  - Left ventricle: The cavity size was normal. Systolic function was    mildly to moderately reduced. The estimated ejection fraction was   in the range of 40% to 45%. Wall motion was normal; there were no   regional wall motion abnormalities. Features are consistent with   a pseudonormal left ventricular filling pattern, with concomitant   abnormal relaxation and increased filling pressure (grade 2   diastolic dysfunction). Doppler parameters are consistent with   elevated ventricular end-diastolic filling pressure. - Aortic valve: There was no regurgitation. - Aortic root: The aortic root was normal in size. - Mitral valve: There was trivial regurgitation. - Left atrium: The atrium was mildly dilated. - Right ventricle: Systolic function was normal. - Right atrium: The atrium was normal in size. - Pulmonary arteries: Systolic pressure was within the normal   range. - Inferior vena cava: The vessel was normal in size. - Pericardium, extracardiac: There was no pericardial effusion.  Impressions:  - Compared to the prior study on 01/12/2015 LVEF decreased,   previously 55%, now 40-45%, there is basal inferior wall   hypokinesis.  Patient Profile     66 y.o. male with a history of CAD s/p prior RCA and recent LAD stenting Nov 2017, HTN, HL, DMII, combined CHF, ICM 40-45%, PSVT s/p RFA Feb 2018, and ESRD on HD, who was admitted 04/22/17 for the evaluation BRBPR and chest pain in the setting of DAPT   Assessment & Plan    1.  Intermittent GI Bleed/BRBPR:  Hgb stable. Plavix and ASA on hold for colonoscopy planned for Friday.   2.  CAD/Stable Angina:  Pt is s/p RCA DES in 2008 and LAD DES in 08/2016.  Residual RCA dzs, which is not felt to be amenable to PCI. ECG non-acute and troponins neg.  Low dose nitrate added.  Not ideal for ranexa given ESRD and mildly prolonged QT.  Hold DAPT while undergoing GI w/u, with plan to resume when ok to do so.  Cont ? blocker and zetia.  3.  Essential HTN:  BPs quite variable in setting of orthostasis on HD days.  Cont ?  blocker.   4.  HL:  Statin intolerant.  Cont zetia.  5.  DM II:  Per IM.  6.  ESRD:  HD per nephrology.   Plan: As above- colonoscopy Friday.  Angelena Form, PA-C  04/25/2017, 9:57 AM     Patient seen and examined. Agree with assessment and plan. Currently undergoing dialysis.  No chest pain or dyspnea. Monitor BP closely with prior BP lability. Last dose of plavix was Monday. For colonoscopy tomorrow. Will check P2Y12 test in early am to make sure adequate Plavix washout.     Troy Sine, MD, [facility], [facility], [facility] 04/25/2017 10:13 AM

## 2017-04-25 NOTE — Anesthesia Preprocedure Evaluation (Signed)
Anesthesia Evaluation  Patient identified by MRN, date of birth, ID band Patient awake    Reviewed: Allergy & Precautions, NPO status , Patient's Chart, lab work & pertinent test results, reviewed documented beta blocker date and time   History of Anesthesia Complications Negative for: history of anesthetic complications  Airway Mallampati: II  TM Distance: >3 FB Neck ROM: Full    Dental  (+) Missing, Poor Dentition   Pulmonary sleep apnea and Continuous Positive Airway Pressure Ventilation , former smoker,    breath sounds clear to auscultation       Cardiovascular hypertension, Pt. on medications + CAD, + Past MI, + Cardiac Stents, + Peripheral Vascular Disease and +CHF   Rhythm:Regular     Neuro/Psych PSYCHIATRIC DISORDERS Depression    GI/Hepatic GERD  ,(+) Hepatitis -  Endo/Other  diabetes, Type 2Hyperthyroidism   Renal/GU ESRF and DialysisRenal disease     Musculoskeletal   Abdominal   Peds  Hematology  (+) anemia ,   Anesthesia Other Findings 1/18 ECHO  Compared to the prior study on 01/12/2015 LVEF decreased,   previously 55%, now 40-45%, there is basal inferior wall   hypokinesis.  11/17  CATH  1. Heavily calcified coronary arteries 2. High grade lesion in midLAD (90%) and borderline lesion midRCA (70-80%) 3. LVEDP 17  Reproductive/Obstetrics                             Anesthesia Physical  Anesthesia Plan  ASA: III  Anesthesia Plan: MAC and General   Post-op Pain Management:  Regional for Post-op pain   Induction:   PONV Risk Score and Plan: 2 and Ondansetron, Dexamethasone and Treatment may vary due to age or medical condition  Airway Management Planned: Natural Airway, Nasal Cannula and Simple Face Mask  Additional Equipment:   Intra-op Plan:   Post-operative Plan:   Informed Consent: I have reviewed the patients History and Physical, chart, labs and discussed  the procedure including the risks, benefits and alternatives for the proposed anesthesia with the patient or authorized representative who has indicated his/her understanding and acceptance.   Dental advisory given  Plan Discussed with: Surgeon and CRNA  Anesthesia Plan Comments:         Anesthesia Quick Evaluation

## 2017-04-25 NOTE — Progress Notes (Signed)
Pt. using Home CPAP for h/s.

## 2017-04-25 NOTE — Progress Notes (Signed)
[facility]astroenterology Progress Note  MACIAH SCHWEIGERT 66 y.o. 1950/12/02  CC:  Lower GI bleed   Subjective:  No new acute issues. Overall doing better. Denied any further bleeding episode. Denied abdominal pain  ROS : Negative for chest pain and shortness of breath.   Objective: Vital signs in last 24 hours: Vitals:   04/25/17 1043 04/25/17 1203  BP: 140/65 (!) 116/46  Pulse: 70 79  Resp: 18 18  Temp: 98 F (36.7 C) 98 F (36.7 C)    Physical Exam:  General:   Alert,  Well-developed, well-nourished, pleasant and cooperative in NAD HEENT : Normocephalic, atraumatic, extraocular movement intact. Moist mucosa. Lungs:  Clear throughout to auscultation.   No wheezes, crackles, or rhonchi. No acute distress. Heart:  Regular rate and rhythm; no murmurs, clicks, rubs,  or gallops. Abdomen: Soft, nontender, nondistended, bowel sounds present. No peritoneal signs Lower extremities And no edema. Pulses intact Psych :  Alert oriented 3. Mood and affect normal    Lab Results:  Recent Labs  04/23/17 0353 04/25/17 0721  NA 140 135  K 4.7 4.4  CL 103 96*  CO2 25 27  GLUCOSE 143* 136*  BUN 74* 46*  CREATININE 12.18* 10.71*  CALCIUM 8.4* 8.3*  PHOS  --  7.2*    Recent Labs  04/25/17 0721  ALBUMIN 3.1*    Recent Labs  04/24/17 0730 04/25/17 0429  WBC 6.1 6.1  HGB 9.3* 8.9*  HCT 29.0* 28.4*  MCV 91.5 91.6  PLT 242 254   No results for input(s): LABPROT, INR in the last 72 hours.    Assessment/Plan: - Intermittent lower GI bleed since last 2 months. Painless bleeding. History of questionable radiation for prostate cancer. Differential diagnosis include radiation proctitis versus hemorrhoidal bleeding versus AVM - Acute on chronic anemia - Coronary artery disease status post PCI in November 2017. Status post AVNRT ablation in February 2018 - End-stage renal disease on hemodialysis.  Recommendations ------------------------- - Appreciate cardiology input. Okay  to hold aspirin and Plavix from cardiac standpoint for procedure. -  colonoscopy Tomorrow. Risks benefits alternatives discussed with the patient. Verbalized understanding. - Further plan based on colonoscopy findings.   Otis Brace MD, [place] 04/25/2017, 2:38 PM  Pager [phone]  If no answer or after 5 PM call [phone]

## 2017-04-25 NOTE — Progress Notes (Signed)
Pt refusing daily weight until 8am. Pt also refuses bedside reporting in the AMs. Will pass on to oncoming shift and continue to monitor.

## 2017-04-26 ENCOUNTER — Encounter (HOSPITAL_COMMUNITY)
Admission: EM | Disposition: A | Discharge status: Home / Self Care | Payer: Self-pay | Source: Home / Self Care | Attending: Internal Medicine

## 2017-04-26 ENCOUNTER — Inpatient Hospital Stay (HOSPITAL_COMMUNITY): Payer: Medicare Other | Admitting: Anesthesiology

## 2017-04-26 ENCOUNTER — Encounter (HOSPITAL_COMMUNITY): Payer: Self-pay | Admitting: Certified Registered"

## 2017-04-26 ENCOUNTER — Other Ambulatory Visit: Payer: Self-pay

## 2017-04-26 ENCOUNTER — Inpatient Hospital Stay (HOSPITAL_COMMUNITY): Payer: Medicare Other

## 2017-04-26 DIAGNOSIS — K6289 Other specified diseases of anus and rectum: Secondary | ICD-10-CM

## 2017-04-26 HISTORY — PX: COLONOSCOPY WITH PROPOFOL: SHX5780

## 2017-04-26 LAB — CBC
HCT: 31.1 % — ABNORMAL LOW (ref 39.0–52.0)
Hemoglobin: 9.6 g/dL — ABNORMAL LOW (ref 13.0–17.0)
MCH: 28.7 pg (ref 26.0–34.0)
MCHC: 30.9 g/dL (ref 30.0–36.0)
MCV: 93.1 fL (ref 78.0–100.0)
PLATELETS: 247 10*3/uL (ref 150–400)
RBC: 3.34 MIL/uL — ABNORMAL LOW (ref 4.22–5.81)
RDW: 19.3 % — AB (ref 11.5–15.5)
WBC: 6.8 10*3/uL (ref 4.0–10.5)

## 2017-04-26 LAB — GLUCOSE, CAPILLARY
GLUCOSE-CAPILLARY: 121 mg/dL — AB (ref 65–99)
Glucose-Capillary: 102 mg/dL — ABNORMAL HIGH (ref 65–99)
Glucose-Capillary: 85 mg/dL (ref 65–99)
Glucose-Capillary: 127 mg/dL — ABNORMAL HIGH (ref 65–99)

## 2017-04-26 LAB — PLATELET INHIBITION P2Y12: Platelet Function  P2Y12: 266 [PRU] (ref 194–418)

## 2017-04-26 SURGERY — COLONOSCOPY WITH PROPOFOL
Anesthesia: Monitor Anesthesia Care

## 2017-04-26 MED ORDER — OXYCODONE HCL 5 MG PO TABS
10.0000 mg | ORAL_TABLET | Freq: Once | ORAL | Status: AC
  Administered 2017-04-26: 10 mg via ORAL
  Filled 2017-04-26: qty 2

## 2017-04-26 MED ORDER — PROPOFOL 10 MG/ML IV BOLUS
INTRAVENOUS | Status: DC | PRN
  Administered 2017-04-26: 10 mg via INTRAVENOUS
  Administered 2017-04-26 (×2): 20 mg via INTRAVENOUS
  Administered 2017-04-26: 10 mg via INTRAVENOUS

## 2017-04-26 MED ORDER — HYDROCORTISONE 2.5 % RE CREA
TOPICAL_CREAM | Freq: Two times a day (BID) | RECTAL | Status: DC
  Administered 2017-04-26 (×2): via RECTAL
  Filled 2017-04-26: qty 28.35

## 2017-04-26 MED ORDER — PROPOFOL 500 MG/50ML IV EMUL
INTRAVENOUS | Status: DC | PRN
  Administered 2017-04-26: 125 ug/kg/min via INTRAVENOUS

## 2017-04-26 MED ORDER — LIDOCAINE HCL (CARDIAC) 20 MG/ML IV SOLN
INTRAVENOUS | Status: DC | PRN
  Administered 2017-04-26: 20 mg via INTRAVENOUS

## 2017-04-26 MED ORDER — CLOPIDOGREL BISULFATE 75 MG PO TABS
75.0000 mg | ORAL_TABLET | Freq: Every day | ORAL | Status: DC
  Administered 2017-04-27: 75 mg via ORAL
  Filled 2017-04-26: qty 1

## 2017-04-26 MED ORDER — PHENYLEPHRINE HCL 10 MG/ML IJ SOLN
INTRAMUSCULAR | Status: DC | PRN
  Administered 2017-04-26: 80 ug via INTRAVENOUS

## 2017-04-26 MED ORDER — SODIUM CHLORIDE 0.9 % IV SOLN
INTRAVENOUS | Status: DC | PRN
  Administered 2017-04-26: 09:00:00 via INTRAVENOUS

## 2017-04-26 SURGICAL SUPPLY — 21 items
ELECT REM PT RETURN 9FT ADLT (ELECTROSURGICAL)
ELECTRODE REM PT RTRN 9FT ADLT (ELECTROSURGICAL) IMPLANT
FCP BXJMBJMB 240X2.8X (CUTTING FORCEPS)
FLOOR PAD 36X40 (MISCELLANEOUS) ×3
FORCEPS BIOP RAD 4 LRG CAP 4 (CUTTING FORCEPS) IMPLANT
FORCEPS BIOP RJ4 240 W/NDL (CUTTING FORCEPS)
FORCEPS BXJMBJMB 240X2.8X (CUTTING FORCEPS) IMPLANT
INJECTOR/SNARE I SNARE (MISCELLANEOUS) IMPLANT
LUBRICANT JELLY 4.5OZ STERILE (MISCELLANEOUS) IMPLANT
MANIFOLD NEPTUNE II (INSTRUMENTS) IMPLANT
NEEDLE SCLEROTHERAPY 25GX240 (NEEDLE) IMPLANT
PAD FLOOR 36X40 (MISCELLANEOUS) ×1 IMPLANT
PROBE APC STR FIRE (PROBE) IMPLANT
PROBE INJECTION GOLD (MISCELLANEOUS)
PROBE INJECTION GOLD 7FR (MISCELLANEOUS) IMPLANT
SNARE ROTATE MED OVAL 20MM (MISCELLANEOUS) IMPLANT
SYR 50ML LL SCALE MARK (SYRINGE) IMPLANT
TRAP SPECIMEN MUCOUS 40CC (MISCELLANEOUS) IMPLANT
TUBING ENDO SMARTCAP PENTAX (MISCELLANEOUS) IMPLANT
TUBING IRRIGATION ENDOGATOR (MISCELLANEOUS) ×3 IMPLANT
WATER STERILE IRR 1000ML POUR (IV SOLUTION) IMPLANT

## 2017-04-26 NOTE — Progress Notes (Signed)
[facility]astroenterology Progress Note  [name] 66 y.o. [birth year]  CC:  Lower GI bleed   Subjective:  No new acute issues. Overall doing better.continues to have intermittent lower GI bleed Denied abdominal pain  ROS : Negative for chest pain and shortness of breath.   Objective: Vital signs in last 24 hours: Vitals:   04/26/17 0606 04/26/17 0827  BP: (!) 163/77 (!) 168/84  Pulse: 92 77  Resp: 17 16  Temp: 98.6 F (37 C) 98.2 F (36.8 C)    Physical Exam:  General:   Alert,  Well-developed, well-nourished, pleasant and cooperative in NAD HEENT : Normocephalic, atraumatic, extraocular movement intact. Moist mucosa. Lungs:  Clear throughout to auscultation.   No wheezes, crackles, or rhonchi. No acute distress. Heart:  Regular rate and rhythm; no murmurs, clicks, rubs,  or gallops. Abdomen: Soft, nontender, nondistended, bowel sounds present. No peritoneal signs Lower extremities -  no edema. Pulses intact Psych :  Alert oriented 3. Mood and affect normal    Lab Results:  Recent Labs  04/25/17 0721  NA 135  K 4.4  CL 96*  CO2 27  GLUCOSE 136*  BUN 46*  CREATININE 10.71*  CALCIUM 8.3*  PHOS 7.2*    Recent Labs  04/25/17 0721  ALBUMIN 3.1*    Recent Labs  04/25/17 0429 04/26/17 0625  WBC 6.1 6.8  HGB 8.9* 9.6*  HCT 28.4* 31.1*  MCV 91.6 93.1  PLT 254 247   No results for input(s): LABPROT, INR in the last 72 hours.    Assessment/Plan: - Intermittent lower GI bleed since last 2 months. Painless bleeding. History of questionable radiation for prostate cancer. Differential diagnosis include radiation proctitis versus hemorrhoidal bleeding versus AVM - Acute on chronic anemia - Coronary artery disease status post PCI in November 2017. Status post AVNRT ablation in February 2018 - End-stage renal disease on hemodialysis.  Recommendations ------------------------- - Colonoscopy today. Risk benefits alternatives discussed with the patient and  patient's wife. Obscure nature of GI bleed also discussed with the patient and wife. They verbalized understanding. Further plan based on colonoscopy findings   Otis Brace MD, FACP 04/26/2017, 8:40 AM  Pager [phone]  If no answer or after 5 PM call [phone]

## 2017-04-26 NOTE — Progress Notes (Signed)
Pt. Post colonoscopy, C/o of severe pain. Unrelieved with PRN pain med. Pt. Requesting something else for pain. On call for [facility] paged to make aware. RN will continue to monitor pt. For changes in condition. Kaylynne Andres, Katherine Roan

## 2017-04-26 NOTE — Anesthesia Postprocedure Evaluation (Signed)
Anesthesia Post Note  Patient: [name]  Procedure(s) Performed: Procedure(s) (LRB): COLONOSCOPY WITH PROPOFOL (N/A)     Patient location during evaluation: PACU Anesthesia Type: MAC Level of consciousness: awake and alert Pain management: pain level controlled Vital Signs Assessment: post-procedure vital signs reviewed and stable Respiratory status: spontaneous breathing, nonlabored ventilation, respiratory function stable and patient connected to nasal cannula oxygen Cardiovascular status: stable and blood pressure returned to baseline Anesthetic complications: no    Last Vitals:  Vitals:   04/26/17 1020 04/26/17 1130  BP: (!) 146/61 (!) 149/64  Pulse: 79 79  Resp: (!) 25 18  Temp:  36.4 C    Last Pain:  Vitals:   04/26/17 1130  TempSrc: Oral  PainSc:                  [name]

## 2017-04-26 NOTE — Progress Notes (Signed)
PROGRESS NOTE   [name]  XIP:[id number]    DOB: 05-14-1951    DOA: 04/22/2017  PCP: Biagio Borg, MD   I have briefly reviewed patients previous medical records in [facility].  Brief Narrative:  66 year old male with PMH of ESRD on TTS HD, CAD, HTN, chronic diastolic CHF, DM 2, HLD, chronic GI bleeding followed by [facility], OSA on CPAP, presented to ED on 04/22/17 due to worsening of his chronic rectal bleeding and some chest discomfort. Last dose of aspirin and Plavix 04/22/17: s/p Colonoscopy 04/26/17. [facility]/cardiology/renal following.   Assessment & Plan:   Principal Problem:   Acute GI bleeding Active Problems:   Dementia   Depression   Gastro-esophageal reflux disease without esophagitis   Ischemic cardiomyopathy   Hyperparathyroidism, secondary ([name])   CAD S/P percutaneous coronary angioplasty   OSA (obstructive sleep apnea)   Chronic systolic heart failure ([name])   Neuropathy due to secondary diabetes mellitus ([name])   Chest pain   Malignant neoplasm of prostate ([name])   DM (diabetes mellitus) type II controlled with renal manifestation ([name])   Status post coronary artery stent placement   Hematochezia   ESRD on hemodialysis ([name])   Essential hypertension   History of PSVT (paroxysmal supraventricular tachycardia)   Chronic GI bleeding   1. Chronic and intermittent lower GI bleed: Ongoing for the last 2 months. Painless bleeding. CT abdomen and pelvis without contrast showed no acute findings. Aspirin and Plavix on hold> last dose was on 7/23. Reports minimal bright red blood in stools during bowel prep prior to colonoscopy. Had colonoscopy 7/27: Small polyp from ascending colon removed, diverticulosis of entire examined colon, petechiae in rectum (? Radiation proctitis)-treated with argon plasma coagulation & noted internal hemorrhoids. Patient seen post colonoscopy in his room and complaints of some rectal pain. Discussed in detail with Dr. Alessandra Bevels  recommends 2 view abdominal x-ray to rule out complications i.e. perforation, monitor overnight, rectal Anusol gel and may consider resuming aspirin and Plavix 7/28. Await pathology results. 2. ESRD on TTS HD: Nephrology following for dialysis needs. Stable. Next dialysis on 7/28. 3. Essential hypertension: Controlled. As per nephrology, blood pressure is labile and prone to drop on HD, takes midodrine prior on HD days. Stable. 4. Anemia: Likely due to chronic kidney disease and may have an element of mild acute blood loss. Stable. Transfuse as needed to keep hemoglobin >8. 5. CAD/stable angina: EKG without acute findings and troponin negative. Cardiology follow-up appreciated. Low-dose nitrate added. This will need to be held on HD days given history of hypotension at dialysis. Not ideal for Ranexa given ESRD and mildly prolonged QRS. DAPT held for GI workup and plan to resume post colonoscopy when okay with GI, possibly 7/28. Continue beta blocker. 6. Hyperlipidemia: Statin intolerant. Continue Zetia. 7. Type II DM: Monitor CBGs and use SSI as needed. Reasonable inpatient control. 8. OSA: Continue nightly CPAP. 9. Hyperthyroid: Clinically euthyroid. Continue methimazole. 10. GERD: PPI. 11. Depression: Stable. Continue Celexa.   DVT prophylaxis: SCDs Code Status: Full Family Communication: Discussed with patient's spouse at bedside. Updated care and answered questions. Disposition: DC home possibly 7/28 after HD.   Consultants:  [facility]   Procedures:  HD CPAP  Colonoscopy 04/26/17: Impression:               - Stool in the ascending colon and in the cecum.                           -  The examined portion of the ileum was normal.                           - One diminutive polyp in the ascending colon,                            removed with a cold biopsy forceps. Resected and                            retrieved.                           - Diverticulosis in the  entire examined colon.                           - Petechia(e) in the rectum. Treated with argon                            plasma coagulation (APC).                           - Internal hemorrhoids. Recommendation:           - Return patient to hospital ward for ongoing care.                           - Full liquid diet.                           - Continue present medications.                           - Await pathology results.                           - Repeat colonoscopy in 1 year because the bowel                            preparation was poor.                           - consider repeating flexible sigmoidoscopy by Dr.                            Watt Climes for RFA if continues to have bleeding  Antimicrobials:  None    Subjective: Seen this afternoon after colonoscopy in the morning. Complains of some rectal pain. Denies abdominal pain. Tolerated regular diet. Reported mild bright red blood mixed with stools during bowel prep last night but none today.   ROS: No chest pain, dyspnea, dizziness or lightheadedness reported.  Objective:  Vitals:   04/26/17 1010 04/26/17 1015 04/26/17 1020 04/26/17 1130  BP: 133/71  (!) 146/61 (!) 149/64  Pulse: 83 85 79 79  Resp: 14 19 (!) 25 18  Temp:    97.6 F (36.4 C)  TempSrc:    Oral  SpO2: 100% 98% 99% 98%  Weight:      Height:  Examination:  General exam: Pleasant middle-aged male lying comfortably supine in bed. Respiratory system: Clear to auscultation. Respiratory effort normal. Stable. Cardiovascular system: S1 & S2 heard, RRR. No JVD, murmurs, rubs, gallops or clicks. No pedal edema. Telemetry: Sinus rhythm. stable.  Gastrointestinal system: Abdomen is nondistended, soft and nontender. No organomegaly or masses felt. Normal bowel sounds heard. stable.  Central nervous system: Alert and oriented. No focal neurological deficits. no change. Extremities: Symmetric 5 x 5 power. Right arm AV fistula thrill +. Skin: No rashes,  lesions or ulcers Psychiatry: Judgement and insight appear normal. Mood & affect appropriate.     Data Reviewed: I have personally reviewed following labs and imaging studies  CBC:  Recent Labs Lab 04/22/17 1245 04/23/17 0353 04/24/17 0730 04/25/17 0429 04/26/17 0625  WBC 6.8 6.9 6.1 6.1 6.8  HGB 10.0* 9.2* 9.3* 8.9* 9.6*  HCT 31.8* 29.5* 29.0* 28.4* 31.1*  MCV 93.0 93.4 91.5 91.6 93.1  PLT 243 224 242 254 034   Basic Metabolic Panel:  Recent Labs Lab 04/22/17 1245 04/23/17 0353 04/25/17 0721  NA 142 140 135  K 4.6 4.7 4.4  CL 104 103 96*  CO2 22 25 27   GLUCOSE 139* 143* 136*  BUN 63* 74* 46*  CREATININE 11.20* 12.18* 10.71*  CALCIUM 8.9 8.4* 8.3*  PHOS  --   --  7.2*   Liver Function Tests:  Recent Labs Lab 04/22/17 1245 04/25/17 0721  AST 22  --   ALT 19  --   ALKPHOS 88  --   BILITOT 0.6  --   PROT 6.8  --   ALBUMIN 3.5 3.1*   Coagulation Profile:  Recent Labs Lab 04/22/17 1245  INR 0.95   CBG:  Recent Labs Lab 04/25/17 1637 04/25/17 2124 04/26/17 0806 04/26/17 1128 04/26/17 1656  GLUCAP 133* 127* 102* 85 121*          Radiology Studies: No results found.      Scheduled Meds: . allopurinol  100 mg Oral Daily  . cinacalcet  90 mg Oral BID WC  . citalopram  10 mg Oral Once per day on Mon Wed Fri  . [START ON 04/27/2017] clopidogrel  75 mg Oral Daily  . colesevelam  625 mg Oral BID WC  . darbepoetin (ARANESP) injection - DIALYSIS  150 mcg Intravenous Q Tue-HD  . doxercalciferol  6 mcg Intravenous Q T,Th,Sa-HD  . ezetimibe  10 mg Oral Daily  . gabapentin  300-600 mg Oral QHS  . hydrocortisone   Rectal BID  . isosorbide mononitrate  15 mg Oral Q M,W,F,Su-1800  . lanthanum  3,000 mg Oral BID WC  . methimazole  5 mg Oral Daily  . metoprolol tartrate  25 mg Oral 2 times per day on Sun Mon Wed Fri  . midodrine  10 mg Oral Q T,Th,Sa-HD  . multivitamin  1 tablet Oral QHS  . pantoprazole  40 mg Oral BID  . sodium chloride  flush  3 mL Intravenous Q12H   Continuous Infusions: . sodium chloride    . ferric gluconate (FERRLECIT/NULECIT) IV Stopped (04/25/17 0914)     LOS: 4 days     HONGALGI,ANAND, MD, FACP, FHM. [facility] Pager [phone]  If 7PM-7AM, please contact night-coverage [email or web address] Password [facility] 04/26/2017, 6:02 PM

## 2017-04-26 NOTE — Progress Notes (Signed)
[facility] Progress Note  Subjective: no c/o, just had colonscopy which showed radiation proctitis  Vitals:   04/26/17 1010 04/26/17 1015 04/26/17 1020 04/26/17 1130  BP: 133/71  (!) 146/61 (!) 149/64  Pulse: 83 85 79 79  Resp: 14 19 (!) 25 18  Temp:    97.6 F (36.4 C)  TempSrc:    Oral  SpO2: 100% 98% 99% 98%  Weight:      Height:        Inpatient medications: . allopurinol  100 mg Oral Daily  . cinacalcet  90 mg Oral BID WC  . citalopram  10 mg Oral Once per day on Mon Wed Fri  . colesevelam  625 mg Oral BID WC  . darbepoetin (ARANESP) injection - DIALYSIS  150 mcg Intravenous Q Tue-HD  . doxercalciferol  6 mcg Intravenous Q T,Th,Sa-HD  . ezetimibe  10 mg Oral Daily  . gabapentin  300-600 mg Oral QHS  . hydrocortisone   Rectal BID  . isosorbide mononitrate  15 mg Oral Q M,W,F,Su-1800  . lanthanum  3,000 mg Oral BID WC  . methimazole  5 mg Oral Daily  . metoprolol tartrate  25 mg Oral 2 times per day on Sun Mon Wed Fri  . midodrine  10 mg Oral Q T,Th,Sa-HD  . multivitamin  1 tablet Oral QHS  . pantoprazole  40 mg Oral BID  . sodium chloride flush  3 mL Intravenous Q12H   . sodium chloride    . ferric gluconate (FERRLECIT/NULECIT) IV Stopped (04/25/17 0914)   sodium chloride, acetaminophen **OR** acetaminophen, hydrALAZINE, HYDROcodone-acetaminophen, ondansetron **OR** ondansetron (ZOFRAN) IV, sodium chloride flush  Exam: General: Obese AAM NAD Head: NCAT sclera not icteric MMM Neck: Supple. No JVD No masses Lungs: CTA bilaterally without wheezes, rales, or rhonchi. Breathing is unlabored. Heart: RRR with systolic murmur  Abdomen: soft NT + BS Lower extremities:without edema or ischemic changes, no open wounds  Neuro: A & O  X 3. Moves all extremities spontaneously. Psych:  Responds to questions appropriately with a normal affect. Dialysis Access: RUE AVF cannulated on HD   Dialysis:  [facility] TTS 4h 180F BFR 450/800  106kg    R AVF No Heparin -  Mircera 50mcg q 4 weeks (last 6/16) - Venofer 50mg  IV q week (7/19) - [name] 6 mcg IV q HD  BMM: Fosrenol 1000 2  tid/ Ca acetate 1 tid / Sensipar 90    Impression/Plan: 1. BRBPR - per colonoscopy today pt has radiation proctitis/ rectal petechiae; rx'd with argon plasma coagulation per GI 2. ESRD -  TTS HD. Plan HD tomorrow if still here.  3. Hypertension/volume  - BP labile, prone to drops on HD, takes midodrine prior/No volume excess on exam and below EDW. Min UF w/ HD tomorrow, lower dry wt possibly at dc 1-2kg.  4. Anemia due to CKD/ ABL- Hgb 10>9.2. Follow trend, transfuse prn. Due for ESA - got 150 Aranesp with HD Tuesday Continue weekly Fe 5. Metabolic bone disease -  Continue VDRA/binders/Sensipar  6. Nutrition - CL diet currently 7. STEMI s/p s/p LAD PCI stent 2017 (on Plavix, ASA)     Kelly Splinter MD [facility] pager [phone]   04/26/2017, 1:48 PM     Recent Labs Lab 04/22/17 1245 04/23/17 0353 04/25/17 0721  NA 142 140 135  K 4.6 4.7 4.4  CL 104 103 96*  CO2 22 25 27   GLUCOSE 139* 143* 136*  BUN 63* 74* 46*  CREATININE  11.20* 12.18* 10.71*  CALCIUM 8.9 8.4* 8.3*  PHOS  --   --  7.2*    Recent Labs Lab 04/22/17 1245 04/25/17 0721  AST 22  --   ALT 19  --   ALKPHOS 88  --   BILITOT 0.6  --   PROT 6.8  --   ALBUMIN 3.5 3.1*    Recent Labs Lab 04/24/17 0730 04/25/17 0429 04/26/17 0625  WBC 6.1 6.1 6.8  HGB 9.3* 8.9* 9.6*  HCT 29.0* 28.4* 31.1*  MCV 91.5 91.6 93.1  PLT 242 254 247   Iron/TIBC/Ferritin/ %Sat    Component Value Date/Time   IRON 42 10/24/2016 1611   TIBC 246 (L) 10/17/2016 1103   FERRITIN 709 (H) 10/17/2016 1103   IRONPCTSAT 14.9 (L) 10/24/2016 1611

## 2017-04-26 NOTE — Patient Outreach (Signed)
[facility]) Care Management  04/26/17  JT BRABEC 10-17-1950 [id number]   Patient was admitted to hospital on 04/22/2017 for rectal bleeding and remains inpatient today.  Will continued to follow.  [place] R. Mahir Prabhakar, RN, BSN, [place] Management Coordinator [phone]

## 2017-04-26 NOTE — Transfer of Care (Signed)
Immediate Anesthesia Transfer of Care Note  Patient: [name]  Procedure(s) Performed: Procedure(s): COLONOSCOPY WITH PROPOFOL (N/A)  Patient Location: Endoscopy Unit  Anesthesia Type:MAC  Level of Consciousness: awake, alert , oriented and sedated  Airway & Oxygen Therapy: Patient Spontanous Breathing and Patient connected to face mask oxygen  Post-op Assessment: Report given to RN, Post -op Vital signs reviewed and stable and Patient moving all extremities X 4  Post vital signs: Reviewed and stable  Last Vitals:  Vitals:   04/26/17 0606 04/26/17 0827  BP: (!) 163/77 (!) 168/84  Pulse: 92 77  Resp: 17 16  Temp: 37 C 36.8 C    Last Pain:  Vitals:   04/26/17 0827  TempSrc: Oral  PainSc:          Complications: No apparent anesthesia complications

## 2017-04-26 NOTE — Op Note (Addendum)
[facility] Patient Name: [name] Procedure Date : 04/26/2017 MRN: [id number] Attending MD: Otis Brace , MD Date of Birth: 07-14-1951 CSN: [id number] Age: 66 Admit Type: Inpatient Procedure:                Colonoscopy Indications:              Rectal bleeding Providers:                Otis Brace, MD, Cleda Daub, RN, Cherylynn Ridges, Technician, Phill Myron. Proofreader, CRNA Referring MD:              Medicines:                Sedation Administered by an Anesthesia Professional Complications:            No immediate complications. Estimated Blood Loss:     Estimated blood loss was minimal. Procedure:                Pre-Anesthesia Assessment:                           - Prior to the procedure, a History and Physical                            was performed, and patient medications and                            allergies were reviewed. The patient's tolerance of                            previous anesthesia was also reviewed. The risks                            and benefits of the procedure and the sedation                            options and risks were discussed with the patient.                            All questions were answered, and informed consent                            was obtained. Prior Anticoagulants: The patient has                            taken Plavix (clopidogrel), last dose was 4 days                            prior to procedure. ASA Grade Assessment: III - A                            patient with severe systemic disease. After  reviewing the risks and benefits, the patient was                            deemed in satisfactory condition to undergo the                            procedure.                           After obtaining informed consent, the colonoscope                            was passed under direct vision. Throughout the                            procedure,  the patient's blood pressure, pulse, and                            oxygen saturations were monitored continuously. The                            Colonoscope was introduced through the anus and                            advanced to the the terminal ileum, with                            identification of the appendiceal orifice and IC                            valve. The colonoscopy was technically difficult                            and complex due to poor bowel prep with stool                            present. The patient tolerated the procedure well.                            The quality of the bowel preparation was fair                            except the ascending colon, cecum and part of                            sigmoid colon was poor. The terminal ileum,                            ileocecal valve, appendiceal orifice, and rectum                            were photographed. The quality of the bowel  preparation was fair except the ascending colon was                            poor. [Anatomical Structures]. Scope In: 9:29:14 AM Scope Out: 9:53:33 AM Scope Withdrawal Time: 0 hours 18 minutes 3 seconds  Total Procedure Duration: 0 hours 24 minutes 19 seconds  Findings:      The perianal and digital rectal examinations were normal.      A moderate amount of solid stool was found in the ascending colon and in       the cecum, interfering with visualization.      The terminal ileum appeared normal.      A diminutive polyp was found in the ascending colon. The polyp was flat.       The polyp was removed with a cold biopsy forceps. Resection and       retrieval were complete.      Scattered small-mouthed diverticula were found in the entire colon.      Multiple petechiae were found in the rectum consistent with radiation       proctitis. patient was not able to retain air in the rectum. This area       was treated with APC.      Internal hemorrhoids  were found during retroflexion. The hemorrhoids       were medium-sized. Impression:               - Stool in the ascending colon and in the cecum.                           - The examined portion of the ileum was normal.                           - One diminutive polyp in the ascending colon,                            removed with a cold biopsy forceps. Resected and                            retrieved.                           - Diverticulosis in the entire examined colon.                           - Petechia(e) in the rectum. Treated with argon                            plasma coagulation (APC).                           - Internal hemorrhoids. Moderate Sedation:      Moderate (conscious) sedation was personally administered by an       anesthesia professional. The following parameters were monitored: oxygen       saturation, heart rate, blood pressure, and response to care. Recommendation:           - Return patient to hospital ward for ongoing care.                           -  Full liquid diet.                           - Continue present medications.                           - Await pathology results.                           - Repeat colonoscopy in 1 year because the bowel                            preparation was poor.                           - consider repeating flexible sigmoidoscopy by Dr.                            Watt Climes for RFA if continues to have bleeding Procedure Code(s):        --- Professional ---                           [phone], 59, Colonoscopy, flexible; with control of                            bleeding, any method                           45380, Colonoscopy, flexible; with biopsy, single                            or multiple Diagnosis Code(s):        --- Professional ---                           K64.8, Other hemorrhoids                           D12.2, Benign neoplasm of ascending colon                           K63.89, Other specified diseases of  intestine                           K62.5, Hemorrhage of anus and rectum                           K57.30, Diverticulosis of large intestine without                            perforation or abscess without bleeding CPT copyright 2016 American Medical Association. All rights reserved. The codes documented in this report are preliminary and upon coder review may  be revised to meet current compliance requirements. Otis Brace, MD Otis Brace, MD 04/26/2017 10:05:19 AM Number of Addenda: 0

## 2017-04-26 NOTE — Brief Op Note (Signed)
04/22/2017 - 04/26/2017  10:06 AM  PATIENT:  [name]  66 y.o. male  PRE-OPERATIVE DIAGNOSIS:  Lower GI bleed  POST-OPERATIVE DIAGNOSIS:  colon polyps,radiation proctitis  PROCEDURE:  Procedure(s): COLONOSCOPY WITH PROPOFOL (N/A)  SURGEON:  Surgeon(s) and Role:    * Aryaa Bunting, MD - Primary  Findings/recommendations -------------------------------------- - Colonoscopy showed changes consistent with radiation proctitis. This area was treated with APC. - Procedure was difficult as patient was not able to retain air in the rectum - Patient also had a poor prep particularly in the right colon limiting visualization but there was no evidence of active bleeding - Recommend flexible sigmoidoscopy as an outpatient with Dr. Watt Climes for possible RFA if continues to have rectal bleeding  - Recommend repeat colonoscopy in one year because of poor prep and right colon - Okay to resume Plavix from GI standpoint. Patient remains at high risk for intermittent GI bleed from radiation proctitis. - Follow-up with Dr. Paulita Fujita in 4-6 weeks  after discharge - GI will sign off. Call us back if needed    Otis Brace MD, [place] 04/26/2017, 10:08 AM  Pager [phone]  If no answer or after 5 PM call [phone]

## 2017-04-27 LAB — RENAL FUNCTION PANEL
ALBUMIN: 3.1 g/dL — AB (ref 3.5–5.0)
ANION GAP: 11 (ref 5–15)
BUN: 29 mg/dL — ABNORMAL HIGH (ref 6–20)
CO2: 26 mmol/L (ref 22–32)
Calcium: 8.5 mg/dL — ABNORMAL LOW (ref 8.9–10.3)
Chloride: 99 mmol/L — ABNORMAL LOW (ref 101–111)
Creatinine, Ser: 9.95 mg/dL — ABNORMAL HIGH (ref 0.61–1.24)
GFR, EST AFRICAN AMERICAN: 5 mL/min — AB (ref >60)
GFR, EST NON AFRICAN AMERICAN: 5 mL/min — AB (ref >60)
GLUCOSE: 124 mg/dL — AB (ref 65–99)
PHOSPHORUS: 5.9 mg/dL — AB (ref 2.5–4.6)
Potassium: 4.7 mmol/L (ref 3.5–5.1)
SODIUM: 136 mmol/L (ref 135–145)

## 2017-04-27 LAB — CBC
HCT: 27.9 % — ABNORMAL LOW (ref 39.0–52.0)
HCT: 28.8 % — ABNORMAL LOW (ref 39.0–52.0)
HEMOGLOBIN: 8.5 g/dL — AB (ref 13.0–17.0)
HEMOGLOBIN: 8.9 g/dL — AB (ref 13.0–17.0)
MCH: 28.3 pg (ref 26.0–34.0)
MCH: 28.7 pg (ref 26.0–34.0)
MCHC: 30.5 g/dL (ref 30.0–36.0)
MCHC: 30.9 g/dL (ref 30.0–36.0)
MCV: 93 fL (ref 78.0–100.0)
MCV: 92.9 fL (ref 78.0–100.0)
Platelets: 253 10*3/uL (ref 150–400)
Platelets: 247 10*3/uL (ref 150–400)
RBC: 3 MIL/uL — AB (ref 4.22–5.81)
RBC: 3.1 MIL/uL — AB (ref 4.22–5.81)
RDW: 19.2 % — ABNORMAL HIGH (ref 11.5–15.5)
RDW: 19.2 % — ABNORMAL HIGH (ref 11.5–15.5)
WBC: 10.8 10*3/uL — ABNORMAL HIGH (ref 4.0–10.5)
WBC: 10.6 10*3/uL — ABNORMAL HIGH (ref 4.0–10.5)

## 2017-04-27 MED ORDER — SODIUM CHLORIDE 0.9 % IV SOLN: 100.0000 mL | INTRAVENOUS | Status: DC | PRN

## 2017-04-27 MED ORDER — HEPARIN SODIUM (PORCINE) 1000 UNIT/ML DIALYSIS: 1000.0000 [IU] | INTRAMUSCULAR | Status: DC | PRN

## 2017-04-27 MED ORDER — ISOSORBIDE MONONITRATE ER 30 MG PO TB24: 15.0000 mg | ORAL_TABLET | ORAL | 0 refills | Status: DC

## 2017-04-27 MED ORDER — PENTAFLUOROPROP-TETRAFLUOROETH EX AERO: 1.0000 "application " | INHALATION_SPRAY | CUTANEOUS | Status: DC | PRN

## 2017-04-27 MED ORDER — LIDOCAINE HCL (PF) 1 % IJ SOLN: 5.0000 mL | INTRAMUSCULAR | Status: DC | PRN

## 2017-04-27 MED ORDER — ALTEPLASE 2 MG IJ SOLR: 2.0000 mg | Freq: Once | INTRAMUSCULAR | Status: DC | PRN

## 2017-04-27 MED ORDER — DOXERCALCIFEROL 4 MCG/2ML IV SOLN
INTRAVENOUS | Status: AC
  Administered 2017-04-27: 6 ug via INTRAVENOUS
  Filled 2017-04-27: qty 4

## 2017-04-27 MED ORDER — LIDOCAINE-PRILOCAINE 2.5-2.5 % EX CREA: 1.0000 "application " | TOPICAL_CREAM | CUTANEOUS | Status: DC | PRN

## 2017-04-27 MED ORDER — MIDODRINE HCL 5 MG PO TABS
ORAL_TABLET | ORAL | Status: AC
  Filled 2017-04-27: qty 2

## 2017-04-27 NOTE — Progress Notes (Signed)
Clarification of my earlier note:  OK for dischg from GI standpoint at any time.  Cleotis Nipper, M.D. Pager [phone] If no answer or after 5 PM call [phone]

## 2017-04-27 NOTE — Procedures (Signed)
  I was present at this dialysis session, have reviewed the session itself and made  appropriate changes Kelly Splinter MD [place] pager [phone]   04/27/2017, 1:10 PM

## 2017-04-27 NOTE — Progress Notes (Signed)
MD talked to cardiology and nephrology, both cleared pt for discharge MD stated no rectal cream needed, no plavix or asprin until follow up and prescriptions have been sent to pharmacy   [name] [name]

## 2017-04-27 NOTE — Progress Notes (Signed)
  Results of colonoscopy noted. Ok to resume Plavix per GI.  Given that stent > 6 months ago will continue to hold Plavix for now until we make sure hgb is stable. Once stable can consider resuming.   We will see again Monday if still in house. Please page with questions.   Glori Bickers, MD  11:46 AM

## 2017-04-27 NOTE — Progress Notes (Signed)
Pt IV discontinued catheter intact and telemetry removed. Pt discharge education provided at bedside. Pt has all belongings packed. Awaiting pt transportation   [place]

## 2017-04-27 NOTE — Progress Notes (Signed)
Pt wife at bedside, education provided. Pt discharged via wheelchair with nurse tech   [name]

## 2017-04-27 NOTE — Progress Notes (Signed)
MD called back stated to take plavix and no asprin  Will re print AVS

## 2017-04-27 NOTE — Progress Notes (Signed)
Pt wife called and asked for update, pt at bedside agreeable   [name] [name]

## 2017-04-27 NOTE — Progress Notes (Signed)
Pt at hemodialysis   [name]

## 2017-04-27 NOTE — Discharge Instructions (Signed)

## 2017-04-27 NOTE — Discharge Summary (Signed)
Physician Discharge Summary  [name] UUV:[id number] DOB: 28-Feb-1951  PCP: Biagio Borg, MD  Admit date: 04/22/2017 Discharge date: 04/27/2017  Recommendations for Outpatient Follow-up:  1. Dr. [name], PCP in 1 week. 2. Dr. Arta Silence, [facility] in 1 month. 3. Dr. Glori Bickers, Cardiology: Office will call with appointment. 4. Hemodialysis Center: Keep regular hemodialysis appointments for Tuesdays, Thursdays & Saturdays. Periodic lab draws (CBC & BMP) across dialysis. Next lab draw on 04/30/17.  Home Health: None Equipment/Devices: None    Discharge Condition: Improved and stable  CODE STATUS: Full  Diet recommendation: Heart healthy diet.  Discharge Diagnoses:  Principal Problem:   Acute GI bleeding Active Problems:   Dementia   Depression   Gastro-esophageal reflux disease without esophagitis   Ischemic cardiomyopathy   Hyperparathyroidism, secondary ([name])   CAD S/P percutaneous coronary angioplasty   OSA (obstructive sleep apnea)   Chronic systolic heart failure ([name])   Neuropathy due to secondary diabetes mellitus ([name])   Chest pain   Malignant neoplasm of prostate ([name])   DM (diabetes mellitus) type II controlled with renal manifestation ([name])   Status post coronary artery stent placement   Hematochezia   ESRD on hemodialysis ([name])   Essential hypertension   History of PSVT (paroxysmal supraventricular tachycardia)   Chronic GI bleeding   Brief Summary: 66 year old male with PMH of ESRD on TTS HD, CAD, HTN, chronic diastolic CHF, DM 2, HLD, chronic GI bleeding followed by [facility], OSA on CPAP, presented to ED on 04/22/17 due to worsening of his chronic rectal bleeding and some chest discomfort. [facility]/cardiology/renal consulted.   Assessment & Plan:   1. Chronic and intermittent lower GI bleed: Ongoing for the last 2 months prior to admission. Painless bleeding. CT abdomen and pelvis without contrast showed no acute findings. Aspirin and Plavix  were held for a few days and patient underwent colonoscopy 7/27: Small polyp from ascending colon removed, diverticulosis of entire examined colon, petechiae in rectum (? Radiation proctitis)-treated with argon plasma coagulation & noted internal hemorrhoids. Post colonoscopy, patient had some rectal pain for about 12 hours. KUB without acute findings. Seen this morning and rectal pain has resolved. No rectal bleeding reported. Seen by GI in follow-up. Discussed with them. It is felt that he had severe spasm-like rectal pain related to [facility] treatment of radiation proctitis which has since resolved. No Anusol recommended. Hemoglobin was repeated today and stable. Patient is advised to follow-up with his primary gastroenterologist in a month from now and repeat treatment (APC versus RFA) may be needed if bleeding recurs in the future. Follow-up pathology results with GI. Cleared by GI for discharge home. Also discussed in detail with Cardiology who recommended stopping aspirin and continuing Plavix alone at discharge. Plavix was resumed in the hospital today. Obviously if he has further bleeding, will have to discontinue Plavix as well. 2. ESRD on TTS HD: Nephrology following for dialysis needs. Stable. Completed dialysis today. Discussed with nephrology and okay for discharge. 3. Essential hypertension: Controlled. As per nephrology, blood pressure is labile and prone to drop on HD, takes midodrine prior on HD days.  4. Anemia: Likely due to chronic kidney disease and may have an element of mild acute blood loss. Initial CBC this morning was 8.5, lower than 9.6 yesterday. Hence repeated hemoglobin and at 8.9 and stable. Follow CBC closely as outpatient. 5. CAD/stable angina: EKG without acute findings and troponin negative. Cardiology follow-up appreciated. Low-dose nitrate added only on nondialysis days. Not  ideal for Ranexa given ESRD and mildly prolonged QRS. DAPT was discussed with Cardiologist today who  recommended stopping aspirin and continuing Plavix. Continue beta blocker. Cardiology will arrange outpatient follow-up. 6. Hyperlipidemia: Statin intolerant. Continue Zetia. 7. Type II DM:  diet controlled as outpatient. Periodically monitor. 8. OSA: Continue nightly CPAP. 9. Hyperthyroid: Clinically euthyroid. Continue methimazole. 10. GERD: PPI. 11. Depression: Stable. Continue Celexa.   Consultants:  [facility] Nephrology   Procedures:  HD CPAP  Colonoscopy 04/26/17: Impression: - Stool in the ascending colon and in the cecum. - The examined portion of the ileum was normal. - One diminutive polyp in the ascending colon,  removed with a cold biopsy forceps. Resected and  retrieved. - Diverticulosis in the entire examined colon. - Petechia(e) in the rectum. Treated with argon  plasma coagulation (APC). - Internal hemorrhoids. Recommendation: - Return patient to hospital ward for ongoing care. - Full liquid diet. - Continue present medications. - Await pathology results. - Repeat colonoscopy in 1 year because the bowel  preparation was poor. - consider repeating flexible sigmoidoscopy by Dr.  Watt Climes for RFA if continues to have bleeding  Discharge Instructions  Discharge Instructions    Call MD for:    Complete by:  As directed    Recurrent rectal bleeding.   Call MD for:  extreme fatigue    Complete by:  As directed    Call MD for:  persistant dizziness or light-headedness    Complete by:  As directed    Call MD for:  severe uncontrolled pain     Complete by:  As directed    Diet - low sodium heart healthy    Complete by:  As directed    Increase activity slowly    Complete by:  As directed        Medication List    STOP taking these medications   aspirin 81 MG tablet     TAKE these medications   acetaminophen 325 MG tablet Commonly known as:  TYLENOL Take 650 mg by mouth every 6 (six) hours as needed for mild pain or headache.   allopurinol 100 MG tablet Commonly known as:  ZYLOPRIM Take 1 tablet (100 mg total) by mouth daily.   citalopram 10 MG tablet Commonly known as:  CELEXA Take 10 mg by mouth daily as needed (anxiety).   clopidogrel 75 MG tablet Commonly known as:  PLAVIX TAKE ONE TABLET BY MOUTH ONCE DAILY WITH  BREAKFAST   colesevelam 625 MG tablet Commonly known as:  WELCHOL TAKE THREE TABLETS BY MOUTH TWICE DAILY WITH MEALS   ezetimibe 10 MG tablet Commonly known as:  ZETIA TAKE ONE TABLET BY MOUTH ONCE DAILY   gabapentin 300 MG capsule Commonly known as:  NEURONTIN 1-2 tabs by mouth at bedtime for sleep and pain   HYDROcodone-acetaminophen 5-325 MG tablet Commonly known as:  NORCO/VICODIN Take 1 tablet by mouth every 6 (six) hours as needed for moderate pain.   hydroxypropyl methylcellulose / hypromellose 2.5 % ophthalmic solution Commonly known as:  ISOPTO TEARS / GONIOVISC Place 1 drop into both eyes 3 (three) times daily as needed for dry eyes.   isosorbide mononitrate 30 MG 24 hr tablet Commonly known as:  IMDUR Take 0.5 tablets (15 mg total) by mouth every Monday,Wednesday,Friday, and Sunday at 6 PM.   Lanthanum Carbonate 1000 MG Pack Take 3 packets by mouth 2 (two) times daily.   methimazole 5 MG tablet Commonly known as:  TAPAZOLE Take 1 tablet (  5 mg total) by mouth daily.   metoprolol tartrate 25 MG tablet Commonly known as:  LOPRESSOR Take 1 tablet (25 mg total) by mouth 2 (two) times daily. ON NON-HD DAYS   midodrine 10 MG tablet Commonly known as:  PROAMATINE Take 1  tablet (10 mg total) by mouth See admin instructions. Once a day only on dialysis days (Tues/Thurs/Sat)   multivitamin Tabs tablet Take 1 tablet by mouth at bedtime. Reported on 10/24/2015   pantoprazole 40 MG tablet Commonly known as:  PROTONIX Take 40 mg by mouth every evening.   SENSIPAR 90 MG tablet Generic drug:  cinacalcet Take 90 mg by mouth 2 (two) times daily.      Follow-up Information    Biagio Borg, MD. Schedule an appointment as soon as possible for a visit in 1 week(s).   Specialties:  Internal Medicine, Radiology Contact information: [place] [address] [phone]        Arta Silence, MD. Schedule an appointment as soon as possible for a visit in 1 month(s).   Specialty:  Gastroenterology Contact information: [address] [place] [address] [phone]        Bensimhon, Shaune Pascal, MD Follow up.   Specialty:  Cardiology Why:  Office will arrange outpatient follow-up. Please call them back if you don't hear from them by mid next week. Contact information: [place] [address] [phone]        Hemodialysis center Follow up.   Why:  Keep regular hemodialysis appointments on Tuesdays, Thursdays and Saturdays. Periodic lab draws (CBC & BMP) across dialysis. Next lab draw on 04/30/17.         Allergies  Allergen Reactions  . Cephalexin Swelling and Other (See Comments)    Tongue swelling  . Statins Other (See Comments)    Weak muscles  . Ciprofloxacin Rash      Procedures/Studies: Ct Abdomen Pelvis Wo Contrast  Result Date: 04/22/2017 CLINICAL DATA:  Rectal bleeding and weakness for several weeks, initial encounter EXAM: CT ABDOMEN AND PELVIS WITHOUT CONTRAST TECHNIQUE: Multidetector CT imaging of the abdomen and pelvis was performed following the standard protocol without IV contrast. COMPARISON:  10/14/2015 FINDINGS: Lower chest: Lung bases are well aerated.  Only minimal dependent atelectatic changes are seen. Some mild bronchiectasis is noted in the right lower lobe. Hepatobiliary: Gallbladder has been surgically removed. Pneumobilia is noted likely related to prior intervention. Pancreas: Unremarkable. No pancreatic ductal dilatation or surrounding inflammatory changes. Spleen: Normal in size without focal abnormality. Adrenals/Urinary Tract: Kidneys demonstrate bilateral cystic change and atrophy consistent with the given clinical history of end-stage renal disease. A few tiny nonobstructing renal stones are seen. The adrenal glands are unremarkable. The bladder is decompressed. Stomach/Bowel: No obstructive changes are seen. No significant inflammatory changes are noted. The appendix is within normal limits. Vascular/Lymphatic: Aortic atherosclerosis. No enlarged abdominal or pelvic lymph nodes. Reproductive: Prostate is unremarkable. Other: No abdominal wall hernia or abnormality. No abdominopelvic ascites. Musculoskeletal: Degenerative changes of lumbar spine are seen. No acute bony abnormality is noted. Postsurgical changes are also noted in the lower lumbar spine. IMPRESSION: Chronic changes similar to that seen on prior exam. No acute abnormality is identified correspond with patient's given clinical symptomatology. Electronically Signed   By: Inez Catalina M.D.   On: 04/22/2017 20:12   Dg Chest 2 View  Result Date: 04/22/2017 CLINICAL DATA:  Patient states that he is having burning feeling  in chest and very weak, states he has blood coming out of his rectum, that is a dark red and bright red. Hx of hyperlipidemia, NSTEMI, prostate cancer, diabetes, hypertension. EXAM: CHEST  2 VIEW COMPARISON:  Chest x-ray dated 12/26/2016. Chest x-ray dated 08/04/2016. FINDINGS: Heart size and mediastinal contours are within normal limits. Aortic atherosclerosis. Mild scarring in the left mid lung. Lungs otherwise clear. No pleural effusion or pneumothorax seen. No acute  or suspicious osseous finding. IMPRESSION: No active cardiopulmonary disease. No evidence of pneumonia or pulmonary edema. Aortic atherosclerosis. Electronically Signed   By: Franki Cabot M.D.   On: 04/22/2017 13:20   Dg Abd 2 Views  Result Date: 04/26/2017 CLINICAL DATA:  Rectal pain status post colonoscopy EXAM: ABDOMEN - 2 VIEW COMPARISON:  04/22/2017 CT FINDINGS: Lung bases are clear. No free air beneath the diaphragm. Retained contrast material in the distal esophagus, stomach, small and large bowel. Surgical clips in the right upper quadrant. Pneumobilia as demonstrated on prior CT. Nonobstructed bowel-gas pattern metallic densities overlying the pubic symphysis. Postsurgical changes of the lower lumbar spine. IMPRESSION: 1. Nonobstructed gas pattern with residual contrast material in the distal esophagus, stomach, small and large bowel 2. Pneumobilia, likely due to postsurgical changes as was present on prior CT. Electronically Signed   By: Donavan Foil M.D.   On: 04/26/2017 19:14      Subjective: Seen this morning at dialysis. Overnight events noted, had rectal pain. No further rectal pain this morning. No rectal bleeding. Denies any other complaints. No chest pain, dyspnea, dizziness or lightheadedness.  Discharge Exam:  Vitals:   04/27/17 1030 04/27/17 1045 04/27/17 1111 04/27/17 1214  BP: (!) 153/66 (!) 153/70 (!) 156/69 (!) 127/55  Pulse: 66 66 68 73  Resp:   12 19  Temp:   98.4 F (36.9 C) 98.1 F (36.7 C)  TempSrc:   Oral Oral  SpO2:   96% 97%  Weight:   105 kg (231 lb 7.7 oz)   Height:        General exam: Pleasant middle-aged male lying comfortably supine in bed. Respiratory system: Clear to auscultation. Respiratory effort normal.  Cardiovascular system: S1 & S2 heard, RRR. No JVD, murmurs, rubs, gallops or clicks. No pedal edema. Telemetry: Sinus rhythm.  Gastrointestinal system: Abdomen is nondistended, soft and nontender. No organomegaly or masses felt. Normal  bowel sounds heard.  Central nervous system: Alert and oriented. No focal neurological deficits.  Extremities: Symmetric 5 x 5 power. Right arm AV fistula thrill +. Skin: No rashes, lesions or ulcers Psychiatry: Judgement and insight appear normal. Mood & affect appropriate.     The results of significant diagnostics from this hospitalization (including imaging, microbiology, ancillary and laboratory) are listed below for reference.      Labs: CBC:  Recent Labs Lab 04/24/17 0730 04/25/17 0429 04/26/17 0625 04/27/17 0515 04/27/17 1100  WBC 6.1 6.1 6.8 10.8* 10.6*  HGB 9.3* 8.9* 9.6* 8.5* 8.9*  HCT 29.0* 28.4* 31.1* 27.9* 28.8*  MCV 91.5 91.6 93.1 93.0 92.9  PLT 242 254 247 253 496   Basic Metabolic Panel:  Recent Labs Lab 04/22/17 1245 04/23/17 0353 04/25/17 0721 04/27/17 0810  NA 142 140 135 136  K 4.6 4.7 4.4 4.7  CL 104 103 96* 99*  CO2 22 25 27 26   GLUCOSE 139* 143* 136* 124*  BUN 63* 74* 46* 29*  CREATININE 11.20* 12.18* 10.71* 9.95*  CALCIUM 8.9 8.4* 8.3* 8.5*  PHOS  --   --  7.2* 5.9*   Liver Function Tests:  Recent Labs Lab 04/22/17 1245 04/25/17 0721 04/27/17 0810  AST 22  --   --   ALT 19  --   --   ALKPHOS 88  --   --   BILITOT 0.6  --   --   PROT 6.8  --   --   ALBUMIN 3.5 3.1* 3.1*   CBG:  Recent Labs Lab 04/25/17 2124 04/26/17 0806 04/26/17 1128 04/26/17 1656 04/26/17 2124  GLUCAP 127* 102* 85 121* 127*       Time coordinating discharge: Over 30 minutes  SIGNED:  Vernell Leep, MD, FACP, FHM. [facility] Pager [phone]  If 7PM-7AM, please contact night-coverage [email or web address] Password [facility] 04/27/2017, 2:36 PM

## 2017-04-27 NOTE — Progress Notes (Signed)
Feels well now.  No bleeding.  Did have severe. spasm-like rectal pain yesterday evening,  after APC treatment of radiation proctitis--responded well to pain medication.  Hgb dropped slightly overnight but was stable/improved on re-check later this morning.  Will s/o--call us if questions.  Pt was advised to make f/u appt w/ his primary GI, Dr. Paulita Fujita, for about 1 month from now.  He is aware that repeat treatment (APC, vs RFA) might be needed if bleeding recurs in the future.  Cleotis Nipper, M.D. Pager [phone] If no answer or after 5 PM call [phone]

## 2017-04-28 LAB — HEPATITIS B SURFACE ANTIGEN: HEP B S AG: NEGATIVE

## 2017-04-29 ENCOUNTER — Other Ambulatory Visit: Payer: Self-pay

## 2017-04-29 NOTE — Patient Outreach (Addendum)
[place] [facility]) Care Management  04/29/17  [name] [birth year] [id number]  Patient with recent admission to hospital for acute GI bleeding Admitted: 04/22/2017 Discharged:  04/27/2017 PMH: ESRD (dialysis on Tuesdays, Thursdays and Saturdays), CAD, HTN, chronic diastolic CHF, DM, HLD, chronic GI bleeding followed by [name] (about 2 months), OSA on CPAP  Per records review: patient presented to the ED on 04/22/2017 due to worsening chronic rectal bleeding and chest discomfort; EKG done with no acute changes and troponin negative; colonoscopy completed 04/26/2017 - small polyp removed, diverticulosis noted and petechiae in rectum (possibly due to radiation proctitis - treated with argon plasma coagulation).  Patient to follow up with PCP in 1 week, Dr. Paulita Fujita (GI) in 1 month, and Dr. [name] (cardiology - they will call to schedule appt).  Successful outreach completed with patient and patient's wife, [name]. Patient identification verified. [name] was moaning and stated it really was not a good time to talk to anyone. He was unable to state what was going on, but admitted he has not been feeling too good.  When RNCM spoke with [name], she stated that patient has an appointment today at 4:00 pm with Dr. [name] (PCP) for hospital follow up. She stated that she has been having a lot of difficulty getting him ready because he is not feeling well and has been complaining of diarrhea today. RNCM offered support and encouragement and reminded patient and his wife of the importance of going in to see his PCP today.  [name] was able to verbalize the reason for patient's hospitalization and her understanding of his discharge instructions. She stated that the doctor told them they they believe his rectal bleeding was coming from radiation prostatitis from when he got treatment for his prostate cancer. She denies any known bleeding since he has been home. She stated that during the  hospitalization, they stopped his plavix, but he is back on it and his aspirin was stopped.  Call was kept brief as she was trying to get patient ready to go to his doctor's appointment and he was in the background moaning and stating he needed help dressing. RNCM encouraged her to callback later this afternoon if she has any questions after the follow up appointment or for support. Advised RNCM would follow up in a few days if she has not heard from her as she stated she currently has her hands full with him not feeling well.  Fall Risk  04/17/2017 02/18/2017 02/07/2017 10/04/2016 06/25/2016  Falls in the past year? Yes Yes Yes - Yes  Comment No new falls since 02/07/2017 per patient - - - -  Number falls in past yr: 2 or more 2 or more 2 or more - 1  Injury with Fall? Yes No Yes - No  Risk Factor Category  High Fall Risk - High Fall Risk - -  Risk for fall due to : History of fall(s);Impaired balance/gait;Impaired mobility - History of fall(s);Impaired balance/gait;Impaired mobility History of fall(s);Impaired balance/gait;Impaired mobility -  Follow up Falls prevention discussed;Falls evaluation completed;Education provided - Falls evaluation completed;Education provided;Falls prevention discussed - -   Depression screen [facility] 2/9 02/07/2017 02/01/2017 10/15/2016 12/29/2015 10/24/2015  Decreased Interest 3 0 1 0 0  Down, Depressed, Hopeless 3 1 1  0 0  PHQ - 2 Score 6 1 2  0 0  Altered sleeping 1 - 1 - -  Tired, decreased energy 3 - 1 - -  Change in appetite 1 - 1 - -  Feeling bad or failure about yourself  0 - 0 - -  Trouble concentrating 0 - 1 - -  Moving slowly or fidgety/restless 0 - 0 - -  Suicidal thoughts 0 - 0 - -  PHQ-9 Score 11 - 6 - -  Difficult doing work/chores Extremely dIfficult Somewhat difficult Somewhat difficult - -  Some recent data might be hidden    Plan: RNCM will follow up with patient and his wife within the next week and [name] is agreeable with this plan. RNCM to ensure he  has scheduled the following additional follow up appointments: Dr. Paulita Fujita [facility]) - within next month Dr. [name] (cardiology) [facility] will follow up with appointment with PCP for any changes/new concerns. RNCM to schedule a home visit within the next few weeks.  [facility] CM Care Plan Problem One     Most Recent Value  Care Plan Problem One  Patient at risk for fluid volume overload as evidenced by dyspnea related to fluid volume overload during last admission  Role Documenting the Problem One  Care Management [place] for Problem One  Active  [facility] Long Term Goal   Patient will not have a hospital admisson related to heart failure in next 90 days  [facility] Long Term Goal Start Date  02/07/17  Interventions for Problem One Long Term Goal  RNCM provided education about heart failure, HF zones, ensured he has follow up appointments and is taking medications    [facility] CM Care Plan Problem Two     Most Recent Value  Care Plan Problem Two  Risk for hospital readmission related to GI bleeding  Role Documenting the Problem Two  Care Management [place] for Problem Two  Active  Interventions for Problem Two Long Term Goal   RNCM educated patient on importance of following up with PCP and GI doctor,  RNCM to provide education on signs and symptoms to report to provider and when to call [place] Term Goal  Patient will not have a hospital admission within the next 31 days related to GI bleeding  [facility] Long Term Goal Start Date  04/29/17  [facility] CM Short Term Goal #1   Patient will see Dr. Paulita Fujita [facility] GI) within the next 30 days  [facility] CM Short Term Goal #1 Start Date  04/29/17  Interventions for Short Term Goal #2   RNCM to ensure patient has appointment scheduled, determine if there are any barriers to attending this appointment and will follow up to ensure patient attends appointment     [place] R. Jaelene Garciagarcia, RN, BSN, [place] Management Coordinator [phone]

## 2017-04-30 ENCOUNTER — Encounter (HOSPITAL_COMMUNITY): Payer: Self-pay | Admitting: Gastroenterology

## 2017-04-30 DIAGNOSIS — N2581 Secondary hyperparathyroidism of renal origin: Secondary | ICD-10-CM | POA: Diagnosis not present

## 2017-04-30 DIAGNOSIS — Z992 Dependence on renal dialysis: Secondary | ICD-10-CM | POA: Diagnosis not present

## 2017-04-30 DIAGNOSIS — D631 Anemia in chronic kidney disease: Secondary | ICD-10-CM | POA: Diagnosis not present

## 2017-04-30 DIAGNOSIS — N186 End stage renal disease: Secondary | ICD-10-CM | POA: Diagnosis not present

## 2017-04-30 DIAGNOSIS — E1129 Type 2 diabetes mellitus with other diabetic kidney complication: Secondary | ICD-10-CM | POA: Diagnosis not present

## 2017-04-30 DIAGNOSIS — E119 Type 2 diabetes mellitus without complications: Secondary | ICD-10-CM | POA: Diagnosis not present

## 2017-04-30 DIAGNOSIS — D509 Iron deficiency anemia, unspecified: Secondary | ICD-10-CM | POA: Diagnosis not present

## 2017-05-01 DIAGNOSIS — L602 Onychogryphosis: Secondary | ICD-10-CM | POA: Diagnosis not present

## 2017-05-01 DIAGNOSIS — L84 Corns and callosities: Secondary | ICD-10-CM | POA: Diagnosis not present

## 2017-05-01 DIAGNOSIS — E1351 Other specified diabetes mellitus with diabetic peripheral angiopathy without gangrene: Secondary | ICD-10-CM | POA: Diagnosis not present

## 2017-05-02 ENCOUNTER — Other Ambulatory Visit: Payer: Self-pay

## 2017-05-02 DIAGNOSIS — E119 Type 2 diabetes mellitus without complications: Secondary | ICD-10-CM | POA: Diagnosis not present

## 2017-05-02 DIAGNOSIS — D631 Anemia in chronic kidney disease: Secondary | ICD-10-CM | POA: Diagnosis not present

## 2017-05-02 DIAGNOSIS — N2581 Secondary hyperparathyroidism of renal origin: Secondary | ICD-10-CM | POA: Diagnosis not present

## 2017-05-02 DIAGNOSIS — D509 Iron deficiency anemia, unspecified: Secondary | ICD-10-CM | POA: Diagnosis not present

## 2017-05-02 DIAGNOSIS — N186 End stage renal disease: Secondary | ICD-10-CM | POA: Diagnosis not present

## 2017-05-02 NOTE — Patient Outreach (Signed)
[place] [facility]) Care Management  05/02/17  [name] 1951/03/03 [id number]  RNCM received voicemail from patient's wife, Donterius Filley on 05/02/2017 at 12:39 pm requesting callback.  RNCM returned call and spoke with patient and his wife. Patient identification verified.  Per [name], patient keeps stating "I feel so drained, I don't have no stamina at all." RNCM also spoke with patient who stated that he is getting frustrating because he is "so tired of being like this, wobbling and cannot walk straight."  Patient denies any bleeding since his discharge home.He stated that he is still hurting and does notice some spotting now and then, but only when wiping. He denies seeing any blood in the toilet after having a bowel movement and does not have any bleeding onto his underwear.  He stated that he has an appointment with the GI doctor on Monday. Per [name], she got some of their appointments mixed up and she took him to her appointment, but she has their appointments straight now. He has a PCP appointment tomorrow.  They currently do not have any questions or concerns.   Plan: RNCM to continue to reach out for transition of care follow up and patient and his wife are agreeable.  [place] R. Selene Peltzer, RN, BSN, [place] Management Coordinator [phone]

## 2017-05-03 ENCOUNTER — Encounter: Payer: Self-pay | Admitting: Internal Medicine

## 2017-05-03 ENCOUNTER — Ambulatory Visit (INDEPENDENT_AMBULATORY_CARE_PROVIDER_SITE_OTHER): Payer: Medicare Other | Admitting: Internal Medicine

## 2017-05-03 ENCOUNTER — Other Ambulatory Visit (INDEPENDENT_AMBULATORY_CARE_PROVIDER_SITE_OTHER): Payer: Medicare Other

## 2017-05-03 VITALS — BP 130/84 | HR 94 | Ht 73.0 in | Wt 232.0 lb

## 2017-05-03 DIAGNOSIS — I255 Ischemic cardiomyopathy: Secondary | ICD-10-CM

## 2017-05-03 DIAGNOSIS — E1121 Type 2 diabetes mellitus with diabetic nephropathy: Secondary | ICD-10-CM | POA: Diagnosis not present

## 2017-05-03 DIAGNOSIS — I10 Essential (primary) hypertension: Secondary | ICD-10-CM

## 2017-05-03 DIAGNOSIS — K922 Gastrointestinal hemorrhage, unspecified: Secondary | ICD-10-CM

## 2017-05-03 DIAGNOSIS — R269 Unspecified abnormalities of gait and mobility: Secondary | ICD-10-CM | POA: Diagnosis not present

## 2017-05-03 LAB — CBC WITH DIFFERENTIAL/PLATELET
BASOS PCT: 0.7 % (ref 0.0–3.0)
Basophils Absolute: 0.1 10*3/uL (ref 0.0–0.1)
EOS PCT: 2 % (ref 0.0–5.0)
Eosinophils Absolute: 0.2 10*3/uL (ref 0.0–0.7)
HCT: 33.3 % — ABNORMAL LOW (ref 39.0–52.0)
Hemoglobin: 10.5 g/dL — ABNORMAL LOW (ref 13.0–17.0)
LYMPHS ABS: 1.7 10*3/uL (ref 0.7–4.0)
Lymphocytes Relative: 18.5 % (ref 12.0–46.0)
MCHC: 31.4 g/dL (ref 30.0–36.0)
MCV: 95.8 fl (ref 78.0–100.0)
MONO ABS: 1.1 10*3/uL — AB (ref 0.1–1.0)
MONOS PCT: 11.9 % (ref 3.0–12.0)
NEUTROS PCT: 66.9 % (ref 43.0–77.0)
Neutro Abs: 6 10*3/uL (ref 1.4–7.7)
Platelets: 320 10*3/uL (ref 150.0–400.0)
RBC: 3.48 Mil/uL — AB (ref 4.22–5.81)
RDW: 21 % — AB (ref 11.5–15.5)
WBC: 9 10*3/uL (ref 4.0–10.5)

## 2017-05-03 MED ORDER — HYPROMELLOSE (GONIOSCOPIC) 2.5 % OP SOLN: 1.0000 [drp] | Freq: Three times a day (TID) | OPHTHALMIC | 11 refills | Status: AC | PRN

## 2017-05-03 NOTE — Patient Instructions (Signed)
Please continue all other medications as before, and refills have been done if requested.  Please have the pharmacy call with any other refills you may need.  Please continue your efforts at being more active, low cholesterol diet, and weight control.  You are otherwise up to date with prevention measures today.  Please keep your appointments with your specialists as you may have planned  You will be contacted regarding the referral for: Physical Therapy  Please go to the LAB in the Basement (turn left off the elevator) for the tests to be done today  You will be contacted by phone if any changes need to be made immediately.  Otherwise, you will receive a letter about your results with an explanation, but please check with MyChart first.  Please remember to sign up for MyChart if you have not done so, as this will be important to you in the future with finding out test results, communicating by private email, and scheduling acute appointments online when needed.  Please return in 6 months, or sooner if needed 

## 2017-05-03 NOTE — Progress Notes (Signed)
Subjective:    Patient ID: [name], male    DOB: 04-20-1951, 66 y.o.   MRN: [id number]  HPI  Here to f/u post hospn 7/23-7/28 with PMH of ESRD on TTS HD, CAD, HTN, chronic diastolic CHF, DM 2, HLD, chronic GI bleeding followed by [facility], OSA on CPAP, presented to ED on 04/22/17 due to worsening of his chronic rectal bleeding and some chest discomfort. [facility]/cardiology/renal consulted, Evaluation was c/w  Gi bleed due to radiation proctitis, now off asa but still taking plavix.  Pt denies chest pain, increased sob or doe, wheezing, orthopnea, PND, increased LE swelling, palpitations, dizziness or syncope, but should take midodrine prior on HD days to avoid lower pressures.    Also c/o bilat calf burning with walking but did not have critical PAD by study oct 2017.    Stil dependent on walker, needs PT to continue improvement to be eligible for possible kidney transplant at [facility].  Has no recent overt bleeding, c/o low stamina overall, no other new complaints.   Pt denies polydipsia, polyuria, Past Medical History:  Diagnosis Date  . Allergic rhinitis, cause unspecified 02/24/2014  . Anemia 06/16/2011  . BENIGN PROSTATIC HYPERTROPHY 10/14/2009  . CAD, NATIVE VESSEL 02/06/2009   a. 06/2007 s/p Taxus DES to the RCA;  b. 08/2016 NSTEMI in setting of SVT/PCI: LM 30ost, LAD 56m (3.0x16 Synergy DES), LCX 61m, OM1 60, RI 40, RCA 70p/m, 62m - not amenable to PCI.  [name] Cervical radiculopathy, chronic 02/23/2016   Right c5-6 by NCS/EMG  . CHEST PAIN 03/29/2010  . Chronic combined systolic (congestive) and diastolic (congestive) heart failure ([place])    a. 10/2016 Echo: EF 40-45%, Gr2 DD. mildly dil LA.  [name] COLONIC POLYPS, HX OF 10/14/2009  . Dementia [id number]  . DEPRESSION 10/14/2009  . Depression 09/24/2015  . DIABETES MELLITUS, TYPE II 02/01/2010  . DIZZINESS 07/17/2010  . DYSLIPIDEMIA 06/18/2007  . ESRD (end stage renal disease) on dialysis ([place]) 08/04/2010   "TTS;  " (04/18/2015)  . FOOT PAIN 08/12/2008    . GAIT DISTURBANCE 03/03/2010  . GASTROENTERITIS, VIRAL 10/14/2009  . GERD 06/18/2007  . GOITER, MULTINODULAR 12/26/2007  . GOUT 06/18/2007  . GYNECOMASTIA 07/17/2010  . Hemodialysis access, fistula mature [facility])    Dialysis T-Th-Sa ([place]) Right upper arm fistula  . Hyperlipidemia 10/16/2011  . Hyperparathyroidism, secondary ([place]) 06/16/2011  . HYPERTENSION 06/18/2007  . Hyperthyroidism   . Hypocalcemia 06/07/2010  . Ischemic cardiomyopathy    a. 10/2016 Echo: EF 40-45%.  [name] NECK PAIN 07/31/2010  . ONYCHOMYCOSIS, TOENAILS 12/26/2007  . OSA on CPAP 10/16/2011  . Other malaise and fatigue 11/24/2009  . PERIPHERAL NEUROPATHY 06/18/2007  . Prostate cancer ([place])   . PSVT (paroxysmal supraventricular tachycardia) ([place])    a. 79/8921 complicated by NSTEMI;  b. 11/2016 Treated w/ adenosine in ED;  c. 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  [name] PULMONARY NODULE, RIGHT LOWER LOBE 06/08/2009  . Sleep apnea    cpap machine and o2  . TRANSAMINASES, SERUM, ELEVATED 02/01/2010  . Transfusion history    none recent  . Unspecified hypotension 01/30/2010   Past Surgical History:  Procedure Laterality Date  . ARTERIOVENOUS GRAFT PLACEMENT Right 2009   forearm/notes 02/01/2011  . AV FISTULA PLACEMENT  11/07/2011   Procedure: INSERTION OF ARTERIOVENOUS (AV) GORE-TEX GRAFT ARM;  Surgeon: Tinnie Gens, MD;  Location: [place];  Service: Vascular;  Laterality: Left;  . BACK SURGERY  1998  . BASCILIC VEIN TRANSPOSITION  Right 02/27/2013   Procedure: BASCILIC VEIN TRANSPOSITION;  Surgeon: Mal Misty, MD;  Location: [place];  Service: Vascular;  Laterality: Right;  Right Basilic Vein Transposition   . CARDIAC CATHETERIZATION N/A 08/06/2016   Procedure: Left Heart Cath and Coronary Angiography;  Surgeon: Jolaine Artist, MD;  Location: [place] CV LAB;  Service: Cardiovascular;  Laterality: N/A;  . CARDIAC CATHETERIZATION N/A 08/07/2016   Procedure: Coronary/Graft Atherectomy-CSI LAD;  Surgeon: Peter M Martinique, MD;   Location: [place] CV LAB;  Service: Cardiovascular;  Laterality: N/A;  . CERVICAL SPINE SURGERY  2/09   "to repair nerve problems in my left arm"  . CHOLECYSTECTOMY    . COLONOSCOPY WITH PROPOFOL N/A 04/26/2017   Procedure: COLONOSCOPY WITH PROPOFOL;  Surgeon: Otis Brace, MD;  Location: [place];  Service: Gastroenterology;  Laterality: N/A;  . CORONARY ANGIOPLASTY WITH STENT PLACEMENT  06/11/2008  . CORONARY ANGIOPLASTY WITH STENT PLACEMENT  06/2007   TAXUS stent to RCA/notes 01/31/2011  . ESOPHAGOGASTRODUODENOSCOPY  09/28/2011   Procedure: ESOPHAGOGASTRODUODENOSCOPY (EGD);  Surgeon: Missy Sabins, MD;  Location: [facility] ENDOSCOPY;  Service: Endoscopy;  Laterality: N/A;  . ESOPHAGOGASTRODUODENOSCOPY N/A 04/07/2015   Procedure: ESOPHAGOGASTRODUODENOSCOPY (EGD);  Surgeon: Teena Irani, MD;  Location: [facility];  Service: Endoscopy;  Laterality: N/A;  . ESOPHAGOGASTRODUODENOSCOPY N/A 04/19/2015   Procedure: ESOPHAGOGASTRODUODENOSCOPY (EGD);  Surgeon: Arta Silence, MD;  Location: [facility] ENDOSCOPY;  Service: Endoscopy;  Laterality: N/A;  . FOREIGN BODY REMOVAL  09/2003   via upper endoscopy/notes 02/12/2011  . GIVENS CAPSULE STUDY  09/30/2011   Procedure: GIVENS CAPSULE STUDY;  Surgeon: Jeryl Columbia, MD;  Location: [facility] ENDOSCOPY;  Service: Endoscopy;  Laterality: N/A;  . INSERTION OF DIALYSIS CATHETER Right 2014  . INSERTION OF DIALYSIS CATHETER Left 02/11/2013   Procedure: INSERTION OF DIALYSIS CATHETER;  Surgeon: Conrad Cole, MD;  Location: [place];  Service: Vascular;  Laterality: Left;  Ultrasound guided  . REMOVAL OF A DIALYSIS CATHETER Right 02/11/2013   Procedure: REMOVAL OF A DIALYSIS CATHETER;  Surgeon: Conrad McC[place], MD;  Location: [place];  Service: Vascular;  Laterality: Right;  . SAVORY DILATION N/A 04/07/2015   Procedure: SAVORY DILATION;  Surgeon: Teena Irani, MD;  Location: [facility] ENDOSCOPY;  Service: Endoscopy;  Laterality: N/A;  . SHUNTOGRAM N/A 09/20/2011   Procedure: Earney Mallet;  Surgeon:  Conrad Leon, MD;  Location: [facility] CATH LAB;  Service: Cardiovascular;  Laterality: N/A;  . SVT ABLATION N/A 11/26/2016   Procedure: SVT Ablation;  Surgeon: Will Meredith Leeds, MD;  Location: [place] CV LAB;  Service: Cardiovascular;  Laterality: N/A;  . TONSILLECTOMY    . TOTAL KNEE ARTHROPLASTY Right 08/02/2015   Procedure: TOTAL KNEE ARTHROPLASTY;  Surgeon: Renette Butters, MD;  Location: [place];  Service: Orthopedics;  Laterality: Right;  . VENOGRAM N/A 01/26/2013   Procedure: VENOGRAM;  Surgeon: Angelia Mould, MD;  Location: [facility] CATH LAB;  Service: Cardiovascular;  Laterality: N/A;    reports that he quit smoking about 11 years ago. His smoking use included Cigarettes. He has a 25.00 pack-year smoking history. He quit smokeless tobacco use about 11 years ago. He reports that he does not drink alcohol or use drugs. family history includes Diabetes in his father; Healthy in his child, child, and child; Heart disease in his father and sister; Hypertension in his father; Kidney failure in his father; Thyroid nodules in his sister. Allergies  Allergen Reactions  . Cephalexin Swelling and Other (See Comments)    Tongue swelling  .  Statins Other (See Comments)    Weak muscles  . Ciprofloxacin Rash   Current Outpatient Prescriptions on File Prior to Visit  Medication Sig Dispense Refill  . acetaminophen (TYLENOL) 325 MG tablet Take 650 mg by mouth every 6 (six) hours as needed for mild pain or headache.     . allopurinol (ZYLOPRIM) 100 MG tablet Take 1 tablet (100 mg total) by mouth daily. 90 tablet 3  . citalopram (CELEXA) 10 MG tablet Take 10 mg by mouth daily as needed (anxiety).     . clopidogrel (PLAVIX) 75 MG tablet TAKE ONE TABLET BY MOUTH ONCE DAILY WITH  BREAKFAST 90 tablet 3  . colesevelam (WELCHOL) 625 MG tablet TAKE THREE TABLETS BY MOUTH TWICE DAILY WITH MEALS 180 tablet 11  . ezetimibe (ZETIA) 10 MG tablet TAKE ONE TABLET BY MOUTH ONCE DAILY 90 tablet 3  . gabapentin  (NEURONTIN) 300 MG capsule 1-2 tabs by mouth at bedtime for sleep and pain 180 capsule 3  . HYDROcodone-acetaminophen (NORCO/VICODIN) 5-325 MG tablet Take 1 tablet by mouth every 6 (six) hours as needed for moderate pain.    . Lanthanum Carbonate 1000 MG PACK Take 3 packets by mouth 2 (two) times daily.     . methimazole (TAPAZOLE) 5 MG tablet Take 1 tablet (5 mg total) by mouth daily. 30 tablet 11  . metoprolol tartrate (LOPRESSOR) 25 MG tablet Take 1 tablet (25 mg total) by mouth 2 (two) times daily. ON NON-HD DAYS 144 tablet 3  . midodrine (PROAMATINE) 10 MG tablet Take 1 tablet (10 mg total) by mouth See admin instructions. Once a day only on dialysis days (Tues/Thurs/Sat) 30 tablet 0  . multivitamin (RENA-VIT) TABS tablet Take 1 tablet by mouth at bedtime. Reported on 10/24/2015    . pantoprazole (PROTONIX) 40 MG tablet Take 40 mg by mouth every evening.     . SENSIPAR 90 MG tablet Take 90 mg by mouth 2 (two) times daily.     No current facility-administered medications on file prior to visit.    Review of Systems  Constitutional: Negative for other unusual diaphoresis or sweats HENT: Negative for ear discharge or swelling Eyes: Negative for other worsening visual disturbances Respiratory: Negative for stridor or other swelling  Gastrointestinal: Negative for worsening distension or other blood Genitourinary: Negative for retention or other urinary change Musculoskeletal: Negative for other MSK pain or swelling Skin: Negative for color change or other new lesions Neurological: Negative for worsening tremors and other numbness  Psychiatric/Behavioral: Negative for worsening agitation or other fatigue All other system neg per pt    Objective:   Physical Exam BP 130/84   Pulse 94   Ht 6\' 1"  (1.854 m)   Wt 232 lb (105.2 kg)   SpO2 98%   BMI 30.61 kg/m  VS noted,  Constitutional: Pt appears in NAD HENT: Head: NCAT.  Right Ear: External ear normal.  Left Ear: External ear normal.    Eyes: . Pupils are equal, round, and reactive to light. Conjunctivae and EOM are normal Nose: without d/c or deformity Neck: Neck supple. Gross normal ROM Cardiovascular: Normal rate and regular rhythm.   Pulmonary/Chest: Effort normal and breath sounds without rales or wheezing.  Abd:  Soft, NT, ND, + BS, no organomegaly Neurological: Pt is alert. At baseline orientation, motor grossly intact but with general weakness Skin: Skin is warm. No rashes, other new lesions, trace to 1+ bilat LE edema Psychiatric: Pt behavior is normal without agitation  No other exam findings  Assessment & Plan:

## 2017-05-04 DIAGNOSIS — D631 Anemia in chronic kidney disease: Secondary | ICD-10-CM | POA: Diagnosis not present

## 2017-05-04 DIAGNOSIS — N2581 Secondary hyperparathyroidism of renal origin: Secondary | ICD-10-CM | POA: Diagnosis not present

## 2017-05-04 DIAGNOSIS — N186 End stage renal disease: Secondary | ICD-10-CM | POA: Diagnosis not present

## 2017-05-04 DIAGNOSIS — D509 Iron deficiency anemia, unspecified: Secondary | ICD-10-CM | POA: Diagnosis not present

## 2017-05-04 DIAGNOSIS — E119 Type 2 diabetes mellitus without complications: Secondary | ICD-10-CM | POA: Diagnosis not present

## 2017-05-05 DIAGNOSIS — R269 Unspecified abnormalities of gait and mobility: Secondary | ICD-10-CM | POA: Insufficient documentation

## 2017-05-05 NOTE — Assessment & Plan Note (Signed)
stable overall by history and exam, recent data reviewed with pt, and pt to continue medical treatment as before,  to f/u any worsening symptoms or concerns Lab Results  Component Value Date   HGBA1C 6.5 01/23/2017

## 2017-05-05 NOTE — Assessment & Plan Note (Signed)
Ok for referral to PT 

## 2017-05-05 NOTE — Assessment & Plan Note (Signed)
Stable, for f/u lab, and f/u with GI as planned

## 2017-05-05 NOTE — Assessment & Plan Note (Signed)
Stable, for cont same tx except midodrine on HD days prior

## 2017-05-06 DIAGNOSIS — K921 Melena: Secondary | ICD-10-CM | POA: Diagnosis not present

## 2017-05-06 DIAGNOSIS — K635 Polyp of colon: Secondary | ICD-10-CM | POA: Diagnosis not present

## 2017-05-06 DIAGNOSIS — K6289 Other specified diseases of anus and rectum: Secondary | ICD-10-CM | POA: Diagnosis not present

## 2017-05-07 DIAGNOSIS — E119 Type 2 diabetes mellitus without complications: Secondary | ICD-10-CM | POA: Diagnosis not present

## 2017-05-07 DIAGNOSIS — N186 End stage renal disease: Secondary | ICD-10-CM | POA: Diagnosis not present

## 2017-05-07 DIAGNOSIS — N2581 Secondary hyperparathyroidism of renal origin: Secondary | ICD-10-CM | POA: Diagnosis not present

## 2017-05-07 DIAGNOSIS — D509 Iron deficiency anemia, unspecified: Secondary | ICD-10-CM | POA: Diagnosis not present

## 2017-05-07 DIAGNOSIS — D631 Anemia in chronic kidney disease: Secondary | ICD-10-CM | POA: Diagnosis not present

## 2017-05-09 DIAGNOSIS — N186 End stage renal disease: Secondary | ICD-10-CM | POA: Diagnosis not present

## 2017-05-09 DIAGNOSIS — D509 Iron deficiency anemia, unspecified: Secondary | ICD-10-CM | POA: Diagnosis not present

## 2017-05-09 DIAGNOSIS — E119 Type 2 diabetes mellitus without complications: Secondary | ICD-10-CM | POA: Diagnosis not present

## 2017-05-09 DIAGNOSIS — N2581 Secondary hyperparathyroidism of renal origin: Secondary | ICD-10-CM | POA: Diagnosis not present

## 2017-05-09 DIAGNOSIS — D631 Anemia in chronic kidney disease: Secondary | ICD-10-CM | POA: Diagnosis not present

## 2017-05-10 ENCOUNTER — Other Ambulatory Visit: Payer: Self-pay

## 2017-05-10 NOTE — Patient Outreach (Signed)
[place] [facility]) Care Management  05/10/17  REO PORTELA December 29, 1950 [id number]  Successful outreach completed with patient. Patient identification notified. Patient stated that he has had some more bleeding and that it was coming from where he had radiation for his prostate   Patient stated that he has not had anything much going on. Has felt a little sore where he had the operation. "I don't know what is wrong with me. I don't have no stamina, my legs want to give out." Patient stated that his PCP, Dr. [name] told him it was his dialysis and his kidneys. However, patient feels like it is something else because he does not have any control of his legs, and just feels so weak. He voiced frustration because he keeps telling his doctors and one says it is his kidneys and his heart doctor tells him it is related to his heart. Patient stated that the "Only time I really feel good is when I lay down." Patient currently denies any pain since he was in the hospital, stating he is "Just wore out." He stated that the best way to describe it is that it feels like he ran a marathon and is now "tired and weak."  Patient stated that he saw his gastroenterologist on Monday and was told to take it easy and don't strain and rectum. He stated it is supposed to feel better in a couple of weeks. He continues to complain of a little soreness and had a little spot of bleeding but stated that should clear up.  Expressed continued concerns about fatigue and not knowing reason this is happening. He feels that it is not just his kidneys and his heart. Later stated that he feels his body is worn down because he doesn't have good kidneys. He spent much of the call discussing how tired he feels, stating "Every time I wake up, I feel that much worse." He stated that he feels he is going down hill. RNCM provided encouragement and support. Provided education about dialysis and how it can cause some of his weakness, but  if he feels this is different from what he is feeling in dialysis, he should discuss this with his PCP further.   Patient has no other questions or concerns at present. Encouraged patient to call RNCM with any questions or needs and RNCM will continue to follow up with patient within the next week for continued transition of care follow up.  [place] R. Tolbert Matheson, RN, BSN, [place] Management Coordinator [phone]

## 2017-05-11 DIAGNOSIS — E119 Type 2 diabetes mellitus without complications: Secondary | ICD-10-CM | POA: Diagnosis not present

## 2017-05-11 DIAGNOSIS — N186 End stage renal disease: Secondary | ICD-10-CM | POA: Diagnosis not present

## 2017-05-11 DIAGNOSIS — D631 Anemia in chronic kidney disease: Secondary | ICD-10-CM | POA: Diagnosis not present

## 2017-05-11 DIAGNOSIS — N2581 Secondary hyperparathyroidism of renal origin: Secondary | ICD-10-CM | POA: Diagnosis not present

## 2017-05-11 DIAGNOSIS — D509 Iron deficiency anemia, unspecified: Secondary | ICD-10-CM | POA: Diagnosis not present

## 2017-05-14 DIAGNOSIS — D631 Anemia in chronic kidney disease: Secondary | ICD-10-CM | POA: Diagnosis not present

## 2017-05-14 DIAGNOSIS — D509 Iron deficiency anemia, unspecified: Secondary | ICD-10-CM | POA: Diagnosis not present

## 2017-05-14 DIAGNOSIS — N186 End stage renal disease: Secondary | ICD-10-CM | POA: Diagnosis not present

## 2017-05-14 DIAGNOSIS — E119 Type 2 diabetes mellitus without complications: Secondary | ICD-10-CM | POA: Diagnosis not present

## 2017-05-14 DIAGNOSIS — N2581 Secondary hyperparathyroidism of renal origin: Secondary | ICD-10-CM | POA: Diagnosis not present

## 2017-05-15 ENCOUNTER — Other Ambulatory Visit: Payer: Self-pay

## 2017-05-15 ENCOUNTER — Ambulatory Visit (HOSPITAL_BASED_OUTPATIENT_CLINIC_OR_DEPARTMENT_OTHER)
Admission: RE | Admit: 2017-05-15 | Discharge: 2017-05-15 | Disposition: A | Discharge status: Home / Self Care | Payer: Medicare Other | Source: Ambulatory Visit | Attending: Internal Medicine | Admitting: Internal Medicine

## 2017-05-15 ENCOUNTER — Encounter (HOSPITAL_COMMUNITY): Payer: Self-pay

## 2017-05-15 VITALS — BP 146/82 | HR 102 | Wt 232.2 lb

## 2017-05-15 DIAGNOSIS — I10 Essential (primary) hypertension: Secondary | ICD-10-CM

## 2017-05-15 DIAGNOSIS — I5042 Chronic combined systolic (congestive) and diastolic (congestive) heart failure: Secondary | ICD-10-CM | POA: Insufficient documentation

## 2017-05-15 DIAGNOSIS — I132 Hypertensive heart and chronic kidney disease with heart failure and with stage 5 chronic kidney disease, or end stage renal disease: Secondary | ICD-10-CM

## 2017-05-15 DIAGNOSIS — E669 Obesity, unspecified: Secondary | ICD-10-CM | POA: Insufficient documentation

## 2017-05-15 DIAGNOSIS — I471 Supraventricular tachycardia: Secondary | ICD-10-CM

## 2017-05-15 DIAGNOSIS — E1122 Type 2 diabetes mellitus with diabetic chronic kidney disease: Secondary | ICD-10-CM

## 2017-05-15 DIAGNOSIS — N186 End stage renal disease: Secondary | ICD-10-CM

## 2017-05-15 DIAGNOSIS — Z87891 Personal history of nicotine dependence: Secondary | ICD-10-CM | POA: Diagnosis not present

## 2017-05-15 DIAGNOSIS — I428 Other cardiomyopathies: Secondary | ICD-10-CM | POA: Diagnosis not present

## 2017-05-15 DIAGNOSIS — I255 Ischemic cardiomyopathy: Secondary | ICD-10-CM

## 2017-05-15 DIAGNOSIS — I959 Hypotension, unspecified: Secondary | ICD-10-CM | POA: Diagnosis not present

## 2017-05-15 DIAGNOSIS — I2583 Coronary atherosclerosis due to lipid rich plaque: Secondary | ICD-10-CM

## 2017-05-15 DIAGNOSIS — R5381 Other malaise: Secondary | ICD-10-CM | POA: Diagnosis not present

## 2017-05-15 DIAGNOSIS — I5032 Chronic diastolic (congestive) heart failure: Secondary | ICD-10-CM | POA: Diagnosis not present

## 2017-05-15 DIAGNOSIS — Z79899 Other long term (current) drug therapy: Secondary | ICD-10-CM

## 2017-05-15 DIAGNOSIS — I2510 Atherosclerotic heart disease of native coronary artery without angina pectoris: Secondary | ICD-10-CM

## 2017-05-15 DIAGNOSIS — Z8546 Personal history of malignant neoplasm of prostate: Secondary | ICD-10-CM | POA: Insufficient documentation

## 2017-05-15 DIAGNOSIS — J449 Chronic obstructive pulmonary disease, unspecified: Secondary | ICD-10-CM | POA: Insufficient documentation

## 2017-05-15 DIAGNOSIS — D62 Acute posthemorrhagic anemia: Secondary | ICD-10-CM | POA: Diagnosis not present

## 2017-05-15 DIAGNOSIS — K625 Hemorrhage of anus and rectum: Secondary | ICD-10-CM | POA: Diagnosis not present

## 2017-05-15 DIAGNOSIS — N2581 Secondary hyperparathyroidism of renal origin: Secondary | ICD-10-CM | POA: Diagnosis not present

## 2017-05-15 NOTE — Patient Instructions (Signed)
Will refer you to home health PT.  Follow up 2 months with Dr. [name]. Take all medication as prescribed the day of your appointment. Bring all medications with you to your appointment.  Do the following things EVERYDAY: 1) Weigh yourself in the morning before breakfast. Write it down and keep it in a log. 2) Take your medicines as prescribed 3) Eat low salt foods-Limit salt (sodium) to 2000 mg per day.  4) Stay as active as you can everyday 5) Limit all fluids for the day to less than 2 liters

## 2017-05-15 NOTE — Progress Notes (Signed)
Advanced Heart Failure Clinic Note   Patient ID: [name], male   DOB: 1951/05/10, 66 y.o.   MRN: [id number]  PCP: Dr [name] Nephrologist: Dr Mercy Moore HF: Dr. [name]   HPI: [name] is 66 year old male with PMH: obesity, DM, COPD on nighttime oxygen, sleep apnea on CPAP, ESRD- HD, CAD, S/P Taxus drug-eluting stent to the right coronary in September 0932, chronic systolic/diastolic heart failure (Unable to tolerate statins so placed on Welchol), prostate CA s/p XRT   Had RHC in 2/11 which showed low pressures (PCWP = 2) , PA 26/16 (22)  and normal cardiac output.  Echo 12/12: EF 55% Myoview 10/15/12: EF 33%. LV Wall Motion: There is global hypokinesis. The LV is markedly enlarged. Small fixed apical defect.  cMRI 02/19/13: EF 37% dilated LV. diffuse HK Echo 6/15: EF 25-30%  Echo 4/16: EF 50-55%  Evaluated at [facility] for kidney transplant but he was not felt to be candidate (11/2012) due to poor mobility, DM, and coronary disease.   CPX 06/04/13   Resting HR: 76 Peak HR: 109 (70% age predicted max HR) BP rest: 107/70 BP peak: 154/69 Peak VO2: 11.8 (53.7% predicted peak VO2) - when corrected to ibw pVO2 15.7 VE/VCO2 slope: 28.8 OUES: 1.90 Peak RER: 1.06  Ve/MVV 34.7%itted    In 11/17 had NSTEMI underwent PCI LAD. RCA had long 80-90% but felt to be not amenable to PCI due to tortuosity. Admitted in 1/18 with SVT and small NSTEMI. Cath films reviewed again with Dr. Martinique and RCA not approachable percutaneously.   Admitted 12/27/16 after transient chest pain and troponin-T came back mildly elevated. Troponin-I normal in ED here and thought stable for home. Pt thought he actually may have had a panic attack.   He presents for follow up today. Feels tired, no SOB. No energy, and feels weak. This is not a change for him. He has felt this way for a few months now. He was participating in PT, but has stopped this due to fatigue. Taking all medications. Volume managed with HD. Denies chest  pain, orthopnea, PND.   Echo 10/17/16 LVEF 40-45%, Grade 2 DD. Trivial MR, Mild LAE, Normal RV.  Past Medical History:  Diagnosis Date  . Allergic rhinitis, cause unspecified 02/24/2014  . Anemia 06/16/2011  . BENIGN PROSTATIC HYPERTROPHY 10/14/2009  . CAD, NATIVE VESSEL 02/06/2009   a. 06/2007 s/p Taxus DES to the RCA;  b. 08/2016 NSTEMI in setting of SVT/PCI: LM 30ost, LAD 62m (3.0x16 Synergy DES), LCX 73m, OM1 60, RI 40, RCA 70p/m, 13m - not amenable to PCI.  [name] Cervical radiculopathy, chronic 02/23/2016   Right c5-6 by NCS/EMG  . CHEST PAIN 03/29/2010  . Chronic combined systolic (congestive) and diastolic (congestive) heart failure ([place])    a. 10/2016 Echo: EF 40-45%, Gr2 DD. mildly dil LA.  [name] COLONIC POLYPS, HX OF 10/14/2009  . Dementia [id number]  . DEPRESSION 10/14/2009  . Depression 09/24/2015  . DIABETES MELLITUS, TYPE II 02/01/2010  . DIZZINESS 07/17/2010  . DYSLIPIDEMIA 06/18/2007  . ESRD (end stage renal disease) on dialysis ([place]) 08/04/2010   "TTS;  " (04/18/2015)  . FOOT PAIN 08/12/2008  . GAIT DISTURBANCE 03/03/2010  . GASTROENTERITIS, VIRAL 10/14/2009  . GERD 06/18/2007  . GOITER, MULTINODULAR 12/26/2007  . GOUT 06/18/2007  . GYNECOMASTIA 07/17/2010  . Hemodialysis access, fistula mature [facility])    Dialysis T-Th-Sa ([place]) Right upper arm fistula  . Hyperlipidemia 10/16/2011  . Hyperparathyroidism, secondary ([place]) 06/16/2011  . HYPERTENSION 06/18/2007  .  Hyperthyroidism   . Hypocalcemia 06/07/2010  . Ischemic cardiomyopathy    a. 10/2016 Echo: EF 40-45%.  [name] NECK PAIN 07/31/2010  . ONYCHOMYCOSIS, TOENAILS 12/26/2007  . OSA on CPAP 10/16/2011  . Other malaise and fatigue 11/24/2009  . PERIPHERAL NEUROPATHY 06/18/2007  . Prostate cancer ([place])   . PSVT (paroxysmal supraventricular tachycardia) ([place])    a. 73/4287 complicated by NSTEMI;  b. 11/2016 Treated w/ adenosine in ED;  c. 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  [name] PULMONARY NODULE, RIGHT LOWER LOBE 06/08/2009  . Sleep apnea      cpap machine and o2  . TRANSAMINASES, SERUM, ELEVATED 02/01/2010  . Transfusion history    none recent  . Unspecified hypotension 01/30/2010    Current Outpatient Prescriptions  Medication Sig Dispense Refill  . acetaminophen (TYLENOL) 325 MG tablet Take 650 mg by mouth every 6 (six) hours as needed for mild pain or headache.     . allopurinol (ZYLOPRIM) 100 MG tablet Take 1 tablet (100 mg total) by mouth daily. 90 tablet 3  . citalopram (CELEXA) 10 MG tablet Take 10 mg by mouth daily as needed (anxiety).     . clopidogrel (PLAVIX) 75 MG tablet TAKE ONE TABLET BY MOUTH ONCE DAILY WITH  BREAKFAST 90 tablet 3  . colesevelam (WELCHOL) 625 MG tablet TAKE THREE TABLETS BY MOUTH TWICE DAILY WITH MEALS 180 tablet 11  . ezetimibe (ZETIA) 10 MG tablet TAKE ONE TABLET BY MOUTH ONCE DAILY 90 tablet 3  . gabapentin (NEURONTIN) 300 MG capsule 1-2 tabs by mouth at bedtime for sleep and pain 180 capsule 3  . HYDROcodone-acetaminophen (NORCO/VICODIN) 5-325 MG tablet Take 1 tablet by mouth every 6 (six) hours as needed for moderate pain.    . hydroxypropyl methylcellulose / hypromellose (ISOPTO TEARS / GONIOVISC) 2.5 % ophthalmic solution Place 1 drop into both eyes 3 (three) times daily as needed for dry eyes. 15 mL 11  . Lanthanum Carbonate 1000 MG PACK Take 3 packets by mouth 2 (two) times daily.     . methimazole (TAPAZOLE) 5 MG tablet Take 1 tablet (5 mg total) by mouth daily. 30 tablet 11  . metoprolol tartrate (LOPRESSOR) 25 MG tablet Take 1 tablet (25 mg total) by mouth 2 (two) times daily. ON NON-HD DAYS 144 tablet 3  . midodrine (PROAMATINE) 10 MG tablet Take 1 tablet (10 mg total) by mouth See admin instructions. Once a day only on dialysis days (Tues/Thurs/Sat) 30 tablet 0  . multivitamin (RENA-VIT) TABS tablet Take 1 tablet by mouth at bedtime. Reported on 10/24/2015    . pantoprazole (PROTONIX) 40 MG tablet Take 40 mg by mouth every evening.     . SENSIPAR 90 MG tablet Take 90 mg by mouth 2 (two)  times daily.     No current facility-administered medications for this encounter.     Vitals:   05/15/17 1410  BP: (!) 146/82  Pulse: (!) 102  SpO2: 95%    PHYSICAL EXAM:  General: Elderly and fatigued appearing male. Wife present at visit.  HEENT: Normal Neck: Supple. JVP 5-6. Carotids 2+ bilat; no bruits. No thyromegaly or nodule noted. Cor: PMI nondisplaced. RRR. 2/6 SEM LUSB Lungs: CTAB, normal effort. Abdomen: Soft, non-tender, non-distended, no HSM. No bruits or masses. +BS  Extremities: No cyanosis, clubbing, rash, R and LLE no edema.  Neuro: Alert & orientedx3, cranial nerves grossly intact. moves all 4 extremities w/o difficulty. Affect pleasant   ASSESSMENT & PLAN:  1. Chronic Systolic Heart Fatigue:  Mixed  ICM/NICM, EF 37% per c-MRI. EF 25-30% (Echo 6/15) Most recent EF 40-45% in 1/18 - NYHA III - Volume managed with HD.  - Continue metoprolol on days he does not have HD.   2. CAD --s/p PCI of LAD 11/17.  -- Recurrent NSTEMI in 1/18 due to SVT. Cath films reviewed has 80-90% long lesion in RCA but not amenable to PCI due to tortuosity - Continue Plavix.   3. HTN  - BP has been labile at HD according to chart review. Will not adjust meds. He is on midodrine.   4. ESRD - HD Tues/Thurs/Sat.   5. PSVT -- Taking Lopressor 25 mg BID  -- s/p ablation for AVNRT on 11/26/16 with resolution of symptoms.  - Stable, no change.   6. Deconditioning.  - I have ordered home health PT.   Follow up in 2 months with Dr. [name]    Arbutus Leas, NP  2:20 PM

## 2017-05-15 NOTE — Patient Outreach (Signed)
[facility]) Care Management  05/15/17  [name] [birth year] [id number]  Attempted to reach patient without success. Left a HIPAA compliant voicemail with RNCM contact information and invited callback.  [place] R. Tyre Beaver, RN, BSN, [place] Management Coordinator [phone]

## 2017-05-16 ENCOUNTER — Inpatient Hospital Stay (HOSPITAL_COMMUNITY)
Admission: EM | Admit: 2017-05-16 | Discharge: 2017-05-22 | DRG: 377 | Disposition: A | Discharge status: Home Health | Payer: Medicare Other | Attending: Internal Medicine | Admitting: Internal Medicine

## 2017-05-16 ENCOUNTER — Encounter (HOSPITAL_COMMUNITY): Payer: Self-pay | Admitting: Emergency Medicine

## 2017-05-16 DIAGNOSIS — K5520 Angiodysplasia of colon without hemorrhage: Secondary | ICD-10-CM | POA: Diagnosis present

## 2017-05-16 DIAGNOSIS — F0390 Unspecified dementia without behavioral disturbance: Secondary | ICD-10-CM | POA: Diagnosis present

## 2017-05-16 DIAGNOSIS — R531 Weakness: Secondary | ICD-10-CM | POA: Diagnosis not present

## 2017-05-16 DIAGNOSIS — K921 Melena: Secondary | ICD-10-CM

## 2017-05-16 DIAGNOSIS — N2581 Secondary hyperparathyroidism of renal origin: Secondary | ICD-10-CM | POA: Diagnosis not present

## 2017-05-16 DIAGNOSIS — I255 Ischemic cardiomyopathy: Secondary | ICD-10-CM | POA: Diagnosis present

## 2017-05-16 DIAGNOSIS — I252 Old myocardial infarction: Secondary | ICD-10-CM

## 2017-05-16 DIAGNOSIS — E1122 Type 2 diabetes mellitus with diabetic chronic kidney disease: Secondary | ICD-10-CM | POA: Diagnosis present

## 2017-05-16 DIAGNOSIS — E0590 Thyrotoxicosis, unspecified without thyrotoxic crisis or storm: Secondary | ICD-10-CM | POA: Diagnosis present

## 2017-05-16 DIAGNOSIS — J810 Acute pulmonary edema: Secondary | ICD-10-CM

## 2017-05-16 DIAGNOSIS — Z7902 Long term (current) use of antithrombotics/antiplatelets: Secondary | ICD-10-CM

## 2017-05-16 DIAGNOSIS — Z833 Family history of diabetes mellitus: Secondary | ICD-10-CM

## 2017-05-16 DIAGNOSIS — Z841 Family history of disorders of kidney and ureter: Secondary | ICD-10-CM

## 2017-05-16 DIAGNOSIS — J811 Chronic pulmonary edema: Secondary | ICD-10-CM

## 2017-05-16 DIAGNOSIS — N186 End stage renal disease: Secondary | ICD-10-CM | POA: Diagnosis present

## 2017-05-16 DIAGNOSIS — I959 Hypotension, unspecified: Secondary | ICD-10-CM

## 2017-05-16 DIAGNOSIS — M5412 Radiculopathy, cervical region: Secondary | ICD-10-CM | POA: Diagnosis present

## 2017-05-16 DIAGNOSIS — K625 Hemorrhage of anus and rectum: Principal | ICD-10-CM | POA: Diagnosis present

## 2017-05-16 DIAGNOSIS — Z8546 Personal history of malignant neoplasm of prostate: Secondary | ICD-10-CM

## 2017-05-16 DIAGNOSIS — K633 Ulcer of intestine: Secondary | ICD-10-CM | POA: Diagnosis present

## 2017-05-16 DIAGNOSIS — E1142 Type 2 diabetes mellitus with diabetic polyneuropathy: Secondary | ICD-10-CM | POA: Diagnosis present

## 2017-05-16 DIAGNOSIS — R42 Dizziness and giddiness: Secondary | ICD-10-CM

## 2017-05-16 DIAGNOSIS — Z9981 Dependence on supplemental oxygen: Secondary | ICD-10-CM

## 2017-05-16 DIAGNOSIS — Z992 Dependence on renal dialysis: Secondary | ICD-10-CM

## 2017-05-16 DIAGNOSIS — Z87891 Personal history of nicotine dependence: Secondary | ICD-10-CM

## 2017-05-16 DIAGNOSIS — Z86010 Personal history of colonic polyps: Secondary | ICD-10-CM

## 2017-05-16 DIAGNOSIS — Z9049 Acquired absence of other specified parts of digestive tract: Secondary | ICD-10-CM

## 2017-05-16 DIAGNOSIS — I5042 Chronic combined systolic (congestive) and diastolic (congestive) heart failure: Secondary | ICD-10-CM | POA: Diagnosis present

## 2017-05-16 DIAGNOSIS — D631 Anemia in chronic kidney disease: Secondary | ICD-10-CM | POA: Diagnosis not present

## 2017-05-16 DIAGNOSIS — R911 Solitary pulmonary nodule: Secondary | ICD-10-CM | POA: Diagnosis present

## 2017-05-16 DIAGNOSIS — G4733 Obstructive sleep apnea (adult) (pediatric): Secondary | ICD-10-CM

## 2017-05-16 DIAGNOSIS — N400 Enlarged prostate without lower urinary tract symptoms: Secondary | ICD-10-CM | POA: Diagnosis present

## 2017-05-16 DIAGNOSIS — E119 Type 2 diabetes mellitus without complications: Secondary | ICD-10-CM | POA: Diagnosis not present

## 2017-05-16 DIAGNOSIS — Z881 Allergy status to other antibiotic agents status: Secondary | ICD-10-CM

## 2017-05-16 DIAGNOSIS — D62 Acute posthemorrhagic anemia: Secondary | ICD-10-CM | POA: Diagnosis present

## 2017-05-16 DIAGNOSIS — E785 Hyperlipidemia, unspecified: Secondary | ICD-10-CM | POA: Diagnosis present

## 2017-05-16 DIAGNOSIS — R404 Transient alteration of awareness: Secondary | ICD-10-CM | POA: Diagnosis not present

## 2017-05-16 DIAGNOSIS — Z6829 Body mass index (BMI) 29.0-29.9, adult: Secondary | ICD-10-CM

## 2017-05-16 DIAGNOSIS — Z79899 Other long term (current) drug therapy: Secondary | ICD-10-CM

## 2017-05-16 DIAGNOSIS — Z96651 Presence of right artificial knee joint: Secondary | ICD-10-CM | POA: Diagnosis present

## 2017-05-16 DIAGNOSIS — I2510 Atherosclerotic heart disease of native coronary artery without angina pectoris: Secondary | ICD-10-CM | POA: Diagnosis present

## 2017-05-16 DIAGNOSIS — K219 Gastro-esophageal reflux disease without esophagitis: Secondary | ICD-10-CM | POA: Diagnosis present

## 2017-05-16 DIAGNOSIS — M109 Gout, unspecified: Secondary | ICD-10-CM | POA: Diagnosis present

## 2017-05-16 DIAGNOSIS — J309 Allergic rhinitis, unspecified: Secondary | ICD-10-CM | POA: Diagnosis present

## 2017-05-16 DIAGNOSIS — G8929 Other chronic pain: Secondary | ICD-10-CM | POA: Diagnosis present

## 2017-05-16 DIAGNOSIS — E8889 Other specified metabolic disorders: Secondary | ICD-10-CM | POA: Diagnosis present

## 2017-05-16 DIAGNOSIS — Z955 Presence of coronary angioplasty implant and graft: Secondary | ICD-10-CM

## 2017-05-16 DIAGNOSIS — E669 Obesity, unspecified: Secondary | ICD-10-CM | POA: Diagnosis present

## 2017-05-16 DIAGNOSIS — I132 Hypertensive heart and chronic kidney disease with heart failure and with stage 5 chronic kidney disease, or end stage renal disease: Secondary | ICD-10-CM | POA: Diagnosis present

## 2017-05-16 DIAGNOSIS — Z888 Allergy status to other drugs, medicaments and biological substances status: Secondary | ICD-10-CM

## 2017-05-16 DIAGNOSIS — Z8249 Family history of ischemic heart disease and other diseases of the circulatory system: Secondary | ICD-10-CM

## 2017-05-16 DIAGNOSIS — D509 Iron deficiency anemia, unspecified: Secondary | ICD-10-CM | POA: Diagnosis not present

## 2017-05-16 DIAGNOSIS — E049 Nontoxic goiter, unspecified: Secondary | ICD-10-CM | POA: Diagnosis present

## 2017-05-16 LAB — I-STAT CG4 LACTIC ACID, ED: Lactic Acid, Venous: 1.54 mmol/L (ref 0.5–1.9)

## 2017-05-16 LAB — CBC
HCT: 29.3 % — ABNORMAL LOW (ref 39.0–52.0)
Hemoglobin: 9.1 g/dL — ABNORMAL LOW (ref 13.0–17.0)
MCH: 28.9 pg (ref 26.0–34.0)
MCHC: 31.1 g/dL (ref 30.0–36.0)
MCV: 93 fL (ref 78.0–100.0)
Platelets: 328 K/uL (ref 150–400)
RBC: 3.15 MIL/uL — ABNORMAL LOW (ref 4.22–5.81)
RDW: 18.5 % — ABNORMAL HIGH (ref 11.5–15.5)
WBC: 7.4 K/uL (ref 4.0–10.5)

## 2017-05-16 LAB — BASIC METABOLIC PANEL WITH GFR
Anion gap: 14 (ref 5–15)
BUN: 58 mg/dL — ABNORMAL HIGH (ref 6–20)
CO2: 24 mmol/L (ref 22–32)
Calcium: 8.8 mg/dL — ABNORMAL LOW (ref 8.9–10.3)
Chloride: 105 mmol/L (ref 101–111)
Creatinine, Ser: 11.42 mg/dL — ABNORMAL HIGH (ref 0.61–1.24)
GFR calc Af Amer: 5 mL/min — ABNORMAL LOW
GFR calc non Af Amer: 4 mL/min — ABNORMAL LOW
Glucose, Bld: 145 mg/dL — ABNORMAL HIGH (ref 65–99)
Potassium: 4.6 mmol/L (ref 3.5–5.1)
Sodium: 143 mmol/L (ref 135–145)

## 2017-05-16 LAB — CBG MONITORING, ED: Glucose-Capillary: 126 mg/dL — ABNORMAL HIGH (ref 65–99)

## 2017-05-16 LAB — PREPARE RBC (CROSSMATCH)

## 2017-05-16 MED ORDER — SODIUM CHLORIDE 0.9 % IV SOLN: 10.0000 mL/h | Freq: Once | INTRAVENOUS | Status: AC

## 2017-05-16 MED ORDER — SODIUM CHLORIDE 0.9 % IV SOLN
80.0000 mg | Freq: Once | INTRAVENOUS | Status: AC
  Administered 2017-05-17: 80 mg via INTRAVENOUS
  Filled 2017-05-16: qty 80

## 2017-05-16 MED ORDER — SODIUM CHLORIDE 0.9 % IV SOLN
20.0000 ug | Freq: Once | INTRAVENOUS | Status: AC
  Administered 2017-05-17: 20 ug via INTRAVENOUS
  Filled 2017-05-16: qty 5

## 2017-05-16 MED ORDER — SODIUM CHLORIDE 0.9 % IV SOLN
Freq: Once | INTRAVENOUS | Status: AC
  Administered 2017-05-16: via INTRAVENOUS

## 2017-05-16 MED ORDER — SODIUM CHLORIDE 0.9 % IV BOLUS (SEPSIS)
1000.0000 mL | Freq: Once | INTRAVENOUS | Status: AC
  Administered 2017-05-16: 1000 mL via INTRAVENOUS

## 2017-05-16 NOTE — ED Notes (Signed)
Per RN and EMT patient had syncopal episode when becoming hypotensive.  Patient moved to resuscitation room and blood administration started.  Blood bank called and aware of the need for platelets. Patient is able to talk in complete sentences. MD and resident at bedside.

## 2017-05-16 NOTE — ED Notes (Signed)
IV established using ultrasound.  Needle visualized in the vein free from the wall.  Blood return noted and flushed without difficulty.  Patient tolerated procedure well.

## 2017-05-16 NOTE — ED Notes (Signed)
Rapid infusing Blood transfusion per ED provider due to syncope episode.

## 2017-05-16 NOTE — ED Provider Notes (Signed)
[facility] DEPT Provider Note   CSN: [id number] Arrival date & time: 05/16/17  2055     History   Chief Complaint Chief Complaint  Patient presents with  . Weakness    recent surgery on rectum    HPI [name] is a 66 y.o. male with history of ESRD on TTS HS, CAD, HTN, chronic diastolic CHF, DM2, HLD, chronic GI bleeding, OSA on CPAP who presented with rectal bleeding this evening. Patient had recent admission for rectal bleeding, reportedly much less severe than this evening. Onset at approximately 1900 this evening. He reports passage of large amounts of clots per rectum. He denies any abdominal or rectal pain.   HPI  Past Medical History:  Diagnosis Date  . Allergic rhinitis, cause unspecified 02/24/2014  . Anemia 06/16/2011  . BENIGN PROSTATIC HYPERTROPHY 10/14/2009  . CAD, NATIVE VESSEL 02/06/2009   a. 06/2007 s/p Taxus DES to the RCA;  b. 08/2016 NSTEMI in setting of SVT/PCI: LM 30ost, LAD 64m (3.0x16 Synergy DES), LCX 58m, OM1 60, RI 40, RCA 70p/m, 31m - not amenable to PCI.  [name] Cervical radiculopathy, chronic 02/23/2016   Right c5-6 by NCS/EMG  . CHEST PAIN 03/29/2010  . Chronic combined systolic (congestive) and diastolic (congestive) heart failure ([place])    a. 10/2016 Echo: EF 40-45%, Gr2 DD. mildly dil LA.  [name] COLONIC POLYPS, HX OF 10/14/2009  . Dementia [id number]  . DEPRESSION 10/14/2009  . Depression 09/24/2015  . DIABETES MELLITUS, TYPE II 02/01/2010  . DIZZINESS 07/17/2010  . DYSLIPIDEMIA 06/18/2007  . ESRD (end stage renal disease) on dialysis ([place]) 08/04/2010   "TTS;  " (04/18/2015)  . FOOT PAIN 08/12/2008  . GAIT DISTURBANCE 03/03/2010  . GASTROENTERITIS, VIRAL 10/14/2009  . GERD 06/18/2007  . GOITER, MULTINODULAR 12/26/2007  . GOUT 06/18/2007  . GYNECOMASTIA 07/17/2010  . Hemodialysis access, fistula mature [facility])    Dialysis T-Th-Sa ([place]) Right upper arm fistula  . Hyperlipidemia 10/16/2011  . Hyperparathyroidism, secondary ([place])  06/16/2011  . HYPERTENSION 06/18/2007  . Hyperthyroidism   . Hypocalcemia 06/07/2010  . Ischemic cardiomyopathy    a. 10/2016 Echo: EF 40-45%.  [name] NECK PAIN 07/31/2010  . ONYCHOMYCOSIS, TOENAILS 12/26/2007  . OSA on CPAP 10/16/2011  . Other malaise and fatigue 11/24/2009  . PERIPHERAL NEUROPATHY 06/18/2007  . Prostate cancer ([place])   . PSVT (paroxysmal supraventricular tachycardia) ([place])    a. 20/2542 complicated by NSTEMI;  b. 11/2016 Treated w/ adenosine in ED;  c. 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  [name] PULMONARY NODULE, RIGHT LOWER LOBE 06/08/2009  . Sleep apnea    cpap machine and o2  . TRANSAMINASES, SERUM, ELEVATED 02/01/2010  . Transfusion history    none recent  . Unspecified hypotension 01/30/2010    Patient Active Problem List   Diagnosis Date Noted  . Gait disorder 05/05/2017  . Chronic GI bleeding 04/22/2017  . Acute GI bleeding 04/22/2017  . Weakness 12/27/2016  . Generalized weakness 12/27/2016  . History of PSVT (paroxysmal supraventricular tachycardia) 10/16/2016  . Abdominal pain 08/27/2016  . Abnormal findings on diagnostic imaging of liver and biliary tract 08/27/2016  . Bloating symptom 08/27/2016  . Hematochezia 08/27/2016  . Early satiety 08/27/2016  . Eructation 08/27/2016  . Esophageal ulcer 08/27/2016  . Flatulence, eructation and gas pain 08/27/2016  . General weakness 08/27/2016  . Skin sensation disturbance 08/27/2016  . Stricture of esophagus 08/27/2016  . Alternating constipation and diarrhea 08/16/2016  . Status post coronary artery stent placement   .  ST segment depression 08/05/2016  . PAD (peripheral artery disease) ([place]) 06/18/2016  . Transient hypotension 04/29/2016  . Cervical radiculopathy, chronic 02/23/2016  . Memory loss 12/22/2015  . Left-sided weakness 12/22/2015  . Peripheral polyneuropathy 12/22/2015  . DM (diabetes mellitus) type II controlled with renal manifestation ([place]) 10/31/2015  . Nausea and vomiting 10/31/2015  . Malignant neoplasm of  prostate ([place]) 10/24/2015  . Right leg pain 09/04/2015  . Chest pain 08/09/2015  . Acute encephalopathy 08/09/2015  . S/P right total knee replacement 08/09/2015  . Primary osteoarthritis of right knee 08/05/2015  . Neuropathy due to secondary diabetes mellitus ([place]) 08/05/2015  . Arthritis of knee 08/02/2015  . Headache 06/08/2015  . Melena 04/19/2015  . Acute blood loss anemia 04/18/2015  . Hyperthyroidism 03/11/2015  . Thigh pain 12/08/2014  . Nodule of right lung 09/14/2014  . PSA elevation 08/30/2014  . ESRD on hemodialysis ([place]) 08/16/2014  . Essential hypertension 08/16/2014  . Pre-transplant evaluation for kidney transplant 08/16/2014  . Allergic rhinitis 02/24/2014  . Elevated PSA 02/24/2014  . Vertigo 10/14/2013  . Cough 06/26/2013  . Numbness 05/15/2013  . Chronic systolic heart failure ([place]) 02/09/2013  . Other complications due to renal dialysis device, implant, and graft 01/14/2013  . Right hand pain 03/28/2012  . Dysphagia 03/28/2012  . Hypogonadism, male 03/28/2012  . OSA (obstructive sleep apnea) 10/16/2011  . Hyperlipidemia 10/16/2011  . CAD S/P percutaneous coronary angioplasty 09/28/2011  . NSTEMI (non-ST elevated myocardial infarction) ([place]) 09/28/2011  . AVM (arteriovenous malformation) 09/27/2011  . Anemia associated with acute blood loss 09/26/2011  . Ischemic cardiomyopathy 06/16/2011  . Hyperparathyroidism, secondary ([place]) 06/16/2011  . Preventative health care 03/11/2011  . NECK PAIN 07/31/2010  . GYNECOMASTIA 07/17/2010  . DIZZINESS 07/17/2010  . GAIT DISTURBANCE 03/03/2010  . Thyrotoxicosis 02/02/2010  . Elevated levels of transaminase & lactic acid dehydrogenase 02/01/2010  . Other malaise and fatigue 11/24/2009  . Depression 10/14/2009  . BENIGN PROSTATIC HYPERTROPHY 10/14/2009  . COLONIC POLYPS, HX OF 10/14/2009  . Dementia 09/02/2009  . PULMONARY NODULE, RIGHT LOWER LOBE 06/08/2009  . Chronic diastolic congestive heart failure ([place])  02/06/2009  . DYSPNEA 10/29/2008  . FOOT PAIN 08/12/2008  . ONYCHOMYCOSIS, TOENAILS 12/26/2007  . GOITER, MULTINODULAR 12/26/2007  . Gout 06/18/2007  . Neuropathy ([place]) 06/18/2007  . Hypertensive heart disease with CHF (congestive heart failure) ([place]) 06/18/2007  . Gastro-esophageal reflux disease without esophagitis 06/18/2007    Past Surgical History:  Procedure Laterality Date  . ARTERIOVENOUS GRAFT PLACEMENT Right 2009   forearm/notes 02/01/2011  . AV FISTULA PLACEMENT  11/07/2011   Procedure: INSERTION OF ARTERIOVENOUS (AV) GORE-TEX GRAFT ARM;  Surgeon: Tinnie Gens, MD;  Location: [facility];  Service: Vascular;  Laterality: Left;  . BACK SURGERY  1998  . BASCILIC VEIN TRANSPOSITION Right 02/27/2013   Procedure: BASCILIC VEIN TRANSPOSITION;  Surgeon: Mal Misty, MD;  Location: [facility];  Service: Vascular;  Laterality: Right;  Right Basilic Vein Transposition   . CARDIAC CATHETERIZATION N/A 08/06/2016   Procedure: Left Heart Cath and Coronary Angiography;  Surgeon: Jolaine Artist, MD;  Location: [facility] CV LAB;  Service: Cardiovascular;  Laterality: N/A;  . CARDIAC CATHETERIZATION N/A 08/07/2016   Procedure: Coronary/Graft Atherectomy-CSI LAD;  Surgeon: Peter M Martinique, MD;  Location: [facility] CV LAB;  Service: Cardiovascular;  Laterality: N/A;  . CERVICAL SPINE SURGERY  2/09   "to repair nerve problems in my left arm"  . CHOLECYSTECTOMY    . COLONOSCOPY WITH PROPOFOL N/A  04/26/2017   Procedure: COLONOSCOPY WITH PROPOFOL;  Surgeon: Otis Brace, MD;  Location: [facility];  Service: Gastroenterology;  Laterality: N/A;  . CORONARY ANGIOPLASTY WITH STENT PLACEMENT  06/11/2008  . CORONARY ANGIOPLASTY WITH STENT PLACEMENT  06/2007   TAXUS stent to RCA/notes 01/31/2011  . ESOPHAGOGASTRODUODENOSCOPY  09/28/2011   Procedure: ESOPHAGOGASTRODUODENOSCOPY (EGD);  Surgeon: Missy Sabins, MD;  Location: [facility] ENDOSCOPY;  Service: Endoscopy;  Laterality: N/A;  . ESOPHAGOGASTRODUODENOSCOPY N/A  04/07/2015   Procedure: ESOPHAGOGASTRODUODENOSCOPY (EGD);  Surgeon: Teena Irani, MD;  Location: [facility];  Service: Endoscopy;  Laterality: N/A;  . ESOPHAGOGASTRODUODENOSCOPY N/A 04/19/2015   Procedure: ESOPHAGOGASTRODUODENOSCOPY (EGD);  Surgeon: Arta Silence, MD;  Location: [facility] ENDOSCOPY;  Service: Endoscopy;  Laterality: N/A;  . FOREIGN BODY REMOVAL  09/2003   via upper endoscopy/notes 02/12/2011  . GIVENS CAPSULE STUDY  09/30/2011   Procedure: GIVENS CAPSULE STUDY;  Surgeon: Jeryl Columbia, MD;  Location: [facility] ENDOSCOPY;  Service: Endoscopy;  Laterality: N/A;  . INSERTION OF DIALYSIS CATHETER Right 2014  . INSERTION OF DIALYSIS CATHETER Left 02/11/2013   Procedure: INSERTION OF DIALYSIS CATHETER;  Surgeon: Conrad Ironville, MD;  Location: [facility];  Service: Vascular;  Laterality: Left;  Ultrasound guided  . REMOVAL OF A DIALYSIS CATHETER Right 02/11/2013   Procedure: REMOVAL OF A DIALYSIS CATHETER;  Surgeon: Conrad [place] Ny[place], MD;  Location: [facility];  Service: Vascular;  Laterality: Right;  . SAVORY DILATION N/A 04/07/2015   Procedure: SAVORY DILATION;  Surgeon: Teena Irani, MD;  Location: [facility] ENDOSCOPY;  Service: Endoscopy;  Laterality: N/A;  . SHUNTOGRAM N/A 09/20/2011   Procedure: Earney Mallet;  Surgeon: Conrad Montrose, MD;  Location: [facility] CATH LAB;  Service: Cardiovascular;  Laterality: N/A;  . SVT ABLATION N/A 11/26/2016   Procedure: SVT Ablation;  Surgeon: Will Meredith Leeds, MD;  Location: [place] CV LAB;  Service: Cardiovascular;  Laterality: N/A;  . TONSILLECTOMY    . TOTAL KNEE ARTHROPLASTY Right 08/02/2015   Procedure: TOTAL KNEE ARTHROPLASTY;  Surgeon: Renette Butters, MD;  Location: [facility];  Service: Orthopedics;  Laterality: Right;  . VENOGRAM N/A 01/26/2013   Procedure: VENOGRAM;  Surgeon: Angelia Mould, MD;  Location: [facility] C[facility] LAB;  Service: Cardiovascular;  Laterality: N/A;       Home Medications    Prior to Admission medications   Medication Sig Start Date End Date Taking?  Authorizing Provider  acetaminophen (TYLENOL) 325 MG tablet Take 650 mg by mouth every 6 (six) hours as needed for mild pain or headache.     [provider]  allopurinol (ZYLOPRIM) 100 MG tablet Take 1 tablet (100 mg total) by mouth daily. 07/20/16   Biagio Borg, MD  citalopram (CELEXA) 10 MG tablet Take 10 mg by mouth daily as needed (anxiety).     [provider]  clopidogrel (PLAVIX) 75 MG tablet TAKE ONE TABLET BY MOUTH ONCE DAILY WITH  BREAKFAST 03/05/17   Bensimhon, Shaune Pascal, MD  colesevelam [facility]) 625 MG tablet TAKE THREE TABLETS BY MOUTH TWICE DAILY WITH MEALS 07/20/16   Biagio Borg, MD  ezetimibe (ZETIA) 10 MG tablet TAKE ONE TABLET BY MOUTH ONCE DAILY 04/08/17   Bensimhon, Shaune Pascal, MD  gabapentin (NEURONTIN) 300 MG capsule 1-2 tabs by mouth at bedtime for sleep and pain 07/20/16   Biagio Borg, MD  HYDROcodone-acetaminophen (NORCO/VICODIN) 5-325 MG tablet Take 1 tablet by mouth every 6 (six) hours as needed for moderate pain.    [provider]  hydroxypropyl methylcellulose /  hypromellose (ISOPTO TEARS / GONIOVISC) 2.5 % ophthalmic solution Place 1 drop into both eyes 3 (three) times daily as needed for dry eyes. 05/03/17   Biagio Borg, MD  Lanthanum Carbonate 1000 MG PACK Take 3 packets by mouth 2 (two) times daily.     [provider]  methimazole (TAPAZOLE) 5 MG tablet Take 1 tablet (5 mg total) by mouth daily. 10/24/16   Renato Shin, MD  metoprolol tartrate (LOPRESSOR) 25 MG tablet Take 1 tablet (25 mg total) by mouth 2 (two) times daily. ON NON-HD DAYS 12/26/16   Camnitz, Ocie Doyne, MD  midodrine (PROAMATINE) 10 MG tablet Take 1 tablet (10 mg total) by mouth See admin instructions. Once a day only on dialysis days (Tues/Thurs/Sat) 10/18/16   Regalado, Jerald Kief A, MD  multivitamin (RENA-VIT) TABS tablet Take 1 tablet by mouth at bedtime. Reported on 10/24/2015    [provider]  pantoprazole (PROTONIX) 40 MG tablet Take 40 mg by mouth  every evening.  08/31/16   [provider]  SENSIPAR 90 MG tablet Take 90 mg by mouth 2 (two) times daily. 09/18/16   [provider]    Family History Family History  Problem Relation Age of Onset  . Heart disease Sister   . Thyroid nodules Sister   . Heart disease Father   . Diabetes Father   . Kidney failure Father   . Hypertension Father   . Healthy Child   . Healthy Child   . Healthy Child   . Cancer Neg Hx     Social History Social History  Substance Use Topics  . Smoking status: Former Smoker    Packs/day: 1.00    Years: 25.00    Types: Cigarettes    Quit date: 10/01/2005  . Smokeless tobacco: Former Systems developer    Quit date: 10/01/2005     Comment: Quit smoking 2007 Smoked x 25 years 1/2 ppd.  . Alcohol use No     Allergies   Cephalexin; Statins; and Ciprofloxacin   Review of Systems Review of Systems  Constitutional: Negative for chills and fever.  HENT: Negative for ear pain and sore throat.   Eyes: Negative for pain and visual disturbance.  Respiratory: Negative for cough and shortness of breath.   Cardiovascular: Negative for chest pain and palpitations.  Gastrointestinal: Positive for anal bleeding and blood in stool. Negative for abdominal pain, rectal pain and vomiting.  Genitourinary: Negative for dysuria and hematuria.  Musculoskeletal: Negative for arthralgias and back pain.  Skin: Negative for color change and rash.  Neurological: Negative for seizures and syncope.  All other systems reviewed and are negative.    Physical Exam Updated Vital Signs BP (!) 116/50   Pulse 71   Temp 98.3 F (36.8 C) (Oral)   Resp 12   Ht 6\' 1"  (1.854 m)   Wt 105.7 kg (233 lb)   SpO2 99%   BMI 30.74 kg/m   Physical Exam  Constitutional: He appears well-developed and well-nourished. He appears distressed.  HENT:  Head: Normocephalic and atraumatic.  Eyes: Conjunctivae are normal.  Neck: Neck supple.  Cardiovascular: Normal rate and regular  rhythm.   No murmur heard. Pulmonary/Chest: Effort normal and breath sounds normal. No respiratory distress.  Abdominal: Soft. There is no tenderness.  Genitourinary:  Genitourinary Comments: Large amount of stool sized clots from rectum during multiple examinations  Musculoskeletal: He exhibits no edema.  Neurological: He is alert.  Skin: Skin is warm and dry.  Psychiatric: He has  a normal mood and affect.  Nursing note and vitals reviewed.    ED Treatments / Results  Labs (all labs ordered are listed, but only abnormal results are displayed) Labs Reviewed  BASIC METABOLIC PANEL - Abnormal; Notable for the following:       Result Value   Glucose, Bld 145 (*)    BUN 58 (*)    Creatinine, Ser 11.42 (*)    Calcium 8.8 (*)    GFR calc non Af Amer 4 (*)    GFR calc Af Amer 5 (*)    All other components within normal limits  CBC - Abnormal; Notable for the following:    RBC 3.15 (*)    Hemoglobin 9.1 (*)    HCT 29.3 (*)    RDW 18.5 (*)    All other components within normal limits  CBG MONITORING, ED - Abnormal; Notable for the following:    Glucose-Capillary 126 (*)    All other components within normal limits  URINALYSIS, ROUTINE W REFLEX MICROSCOPIC  I-STAT CG4 LACTIC ACID, ED  I-STAT CG4 LACTIC ACID, ED  TYPE AND SCREEN  PREPARE RBC (CROSSMATCH)  PREPARE PLATELET PHERESIS    EKG  EKG Interpretation  Date/Time:  Thursday May 16 2017 21:05:29 EDT Ventricular Rate:  75 PR Interval:    QRS Duration: 102 QT Interval:  440 QTC Calculation: 492 R Axis:   68 Text Interpretation:  Sinus rhythm Borderline T abnormalities, lateral leads Borderline prolonged QT interval No STEMI.  Confirmed by Nanda Quinton [phone]) on 05/16/2017 10:50:27 PM       Radiology No results found.  Procedures Procedures (including critical care time)  Medications Ordered in ED Medications  0.9 %  sodium chloride infusion (not administered)  pantoprazole (PROTONIX) 80 mg in sodium  chloride 0.9 % 100 mL IVPB (not administered)  desmopressin (DDAVP) 20 mcg in sodium chloride 0.9 % 50 mL IVPB (not administered)  sodium chloride 0.9 % bolus 1,000 mL (0 mLs Intravenous Stopped 05/16/17 2257)  0.9 %  sodium chloride infusion ( Intravenous New Bag/Given 05/16/17 2330)     Initial Impression / Assessment and Plan / ED Course  I have reviewed the triage vital signs and the nursing notes.  Pertinent labs & imaging results that were available during my care of the patient were reviewed by me and considered in my medical decision making (see chart for details).    Patient with history of ESRD on TTS HS, CAD, HTN, chronic diastolic CHF, DM2, HLD, chronic GI bleeding, OSA on CPAP who presented with rectal bleeding this evening. Recent admission 7/23-7/28 for painless rectal bleeding, colonoscopy 7/27 significant for radiation proctitis treated with argon plasma coagulation. ASA was stopped on discharge, patient has resumed plavix. Patient initially arrived HDS, in mild distress, with exam findings significant for large clots and active bleeding from rectum.  Discussed with GI, who recommended continued resuscitation, but no acute needs for intervention given his recent findings of proctitis.   While pending labs, patient continued to have large amount of rectal bleeding and clot passing. He started to become more hypotensive, SBP 90/50s, and had syncopal event. He was moved to resuscitation bay. He was ordered 2U emergency release pRBC, 1U plt and DDAVP.   Discussed change in exam with GI, in addition to IR. Plan for continued resuscitation with blood products, and nuclear study ordered in order to determine if bleeding from proctitis vs diverticular bleed to determine which service may be able to intervene.  Patient went to HD today however unable to undergo HD due to difficulty accessing AV fistula. No emergent needs for dialysis at this time. Given anticipated blood resuscitation,  he will need Nephrology consult in next 24 hours, especially given AV fistula issues.   Given symptomatic rectal bleeding with syncope and transient hypotension, and planned resuscitation in setting of ESRD and CHF, discussed with Critical Care for admission. Labs significant for hgb 9.1, drop from 10.5 from last admission.   Patient and his wife in agreement with plan at time of admission.   Patient and plan of care discussed with Attending physician, Dr. Laverta [place].      Final Clinical Impressions(s) / ED Diagnoses   Final diagnoses:  Rectal bleeding  Hypotension, unspecified hypotension type    New Prescriptions New Prescriptions   No medications on file     Arnetha Massy, MD 05/17/17 0017    Margette Fast, MD 05/17/17 [phone]

## 2017-05-16 NOTE — ED Notes (Signed)
Cleaned pt bed. Blood from the rectum soaked bed with black clumps.

## 2017-05-16 NOTE — ED Triage Notes (Signed)
Pt states having what he thought was an uncontrolled BM around 1900 today.  He states seeing dark red chuncky blood run down his leg.  Since that episode he feels a little weak.  He is a dialysis pt who was scheduled for an appointment today at 75, but his access wasn't able to be used today for unknown reasons. Pt is currently resting NAD noted.

## 2017-05-17 ENCOUNTER — Inpatient Hospital Stay (HOSPITAL_COMMUNITY): Payer: Medicare Other

## 2017-05-17 ENCOUNTER — Other Ambulatory Visit: Payer: Self-pay

## 2017-05-17 ENCOUNTER — Encounter (HOSPITAL_COMMUNITY): Payer: Self-pay | Admitting: General Surgery

## 2017-05-17 DIAGNOSIS — K5520 Angiodysplasia of colon without hemorrhage: Secondary | ICD-10-CM | POA: Diagnosis present

## 2017-05-17 DIAGNOSIS — J810 Acute pulmonary edema: Secondary | ICD-10-CM | POA: Diagnosis not present

## 2017-05-17 DIAGNOSIS — K627 Radiation proctitis: Secondary | ICD-10-CM | POA: Diagnosis not present

## 2017-05-17 DIAGNOSIS — I132 Hypertensive heart and chronic kidney disease with heart failure and with stage 5 chronic kidney disease, or end stage renal disease: Secondary | ICD-10-CM | POA: Diagnosis present

## 2017-05-17 DIAGNOSIS — E049 Nontoxic goiter, unspecified: Secondary | ICD-10-CM | POA: Diagnosis present

## 2017-05-17 DIAGNOSIS — G4733 Obstructive sleep apnea (adult) (pediatric): Secondary | ICD-10-CM | POA: Diagnosis present

## 2017-05-17 DIAGNOSIS — K922 Gastrointestinal hemorrhage, unspecified: Secondary | ICD-10-CM | POA: Diagnosis not present

## 2017-05-17 DIAGNOSIS — I2510 Atherosclerotic heart disease of native coronary artery without angina pectoris: Secondary | ICD-10-CM | POA: Diagnosis present

## 2017-05-17 DIAGNOSIS — E785 Hyperlipidemia, unspecified: Secondary | ICD-10-CM | POA: Diagnosis present

## 2017-05-17 DIAGNOSIS — D62 Acute posthemorrhagic anemia: Secondary | ICD-10-CM | POA: Diagnosis not present

## 2017-05-17 DIAGNOSIS — K625 Hemorrhage of anus and rectum: Secondary | ICD-10-CM

## 2017-05-17 DIAGNOSIS — M109 Gout, unspecified: Secondary | ICD-10-CM | POA: Diagnosis present

## 2017-05-17 DIAGNOSIS — I5042 Chronic combined systolic (congestive) and diastolic (congestive) heart failure: Secondary | ICD-10-CM | POA: Diagnosis present

## 2017-05-17 DIAGNOSIS — R42 Dizziness and giddiness: Secondary | ICD-10-CM | POA: Diagnosis not present

## 2017-05-17 DIAGNOSIS — K5730 Diverticulosis of large intestine without perforation or abscess without bleeding: Secondary | ICD-10-CM | POA: Diagnosis not present

## 2017-05-17 DIAGNOSIS — J309 Allergic rhinitis, unspecified: Secondary | ICD-10-CM | POA: Diagnosis present

## 2017-05-17 DIAGNOSIS — K921 Melena: Secondary | ICD-10-CM | POA: Diagnosis not present

## 2017-05-17 DIAGNOSIS — K626 Ulcer of anus and rectum: Secondary | ICD-10-CM | POA: Diagnosis not present

## 2017-05-17 DIAGNOSIS — E1122 Type 2 diabetes mellitus with diabetic chronic kidney disease: Secondary | ICD-10-CM | POA: Diagnosis present

## 2017-05-17 DIAGNOSIS — D500 Iron deficiency anemia secondary to blood loss (chronic): Secondary | ICD-10-CM | POA: Diagnosis not present

## 2017-05-17 DIAGNOSIS — I252 Old myocardial infarction: Secondary | ICD-10-CM | POA: Diagnosis not present

## 2017-05-17 DIAGNOSIS — I120 Hypertensive chronic kidney disease with stage 5 chronic kidney disease or end stage renal disease: Secondary | ICD-10-CM | POA: Diagnosis not present

## 2017-05-17 DIAGNOSIS — N186 End stage renal disease: Secondary | ICD-10-CM | POA: Diagnosis present

## 2017-05-17 DIAGNOSIS — E0590 Thyrotoxicosis, unspecified without thyrotoxic crisis or storm: Secondary | ICD-10-CM | POA: Diagnosis present

## 2017-05-17 DIAGNOSIS — I959 Hypotension, unspecified: Secondary | ICD-10-CM | POA: Diagnosis not present

## 2017-05-17 DIAGNOSIS — I255 Ischemic cardiomyopathy: Secondary | ICD-10-CM | POA: Diagnosis present

## 2017-05-17 DIAGNOSIS — N400 Enlarged prostate without lower urinary tract symptoms: Secondary | ICD-10-CM | POA: Diagnosis present

## 2017-05-17 DIAGNOSIS — D631 Anemia in chronic kidney disease: Secondary | ICD-10-CM | POA: Diagnosis not present

## 2017-05-17 DIAGNOSIS — N2581 Secondary hyperparathyroidism of renal origin: Secondary | ICD-10-CM | POA: Diagnosis present

## 2017-05-17 DIAGNOSIS — R197 Diarrhea, unspecified: Secondary | ICD-10-CM | POA: Diagnosis not present

## 2017-05-17 DIAGNOSIS — Z992 Dependence on renal dialysis: Secondary | ICD-10-CM | POA: Diagnosis not present

## 2017-05-17 DIAGNOSIS — R911 Solitary pulmonary nodule: Secondary | ICD-10-CM | POA: Diagnosis not present

## 2017-05-17 DIAGNOSIS — M5412 Radiculopathy, cervical region: Secondary | ICD-10-CM | POA: Diagnosis present

## 2017-05-17 DIAGNOSIS — K633 Ulcer of intestine: Secondary | ICD-10-CM | POA: Diagnosis present

## 2017-05-17 DIAGNOSIS — F0390 Unspecified dementia without behavioral disturbance: Secondary | ICD-10-CM | POA: Diagnosis present

## 2017-05-17 DIAGNOSIS — E8889 Other specified metabolic disorders: Secondary | ICD-10-CM | POA: Diagnosis present

## 2017-05-17 DIAGNOSIS — K219 Gastro-esophageal reflux disease without esophagitis: Secondary | ICD-10-CM | POA: Diagnosis present

## 2017-05-17 DIAGNOSIS — K5900 Constipation, unspecified: Secondary | ICD-10-CM | POA: Diagnosis not present

## 2017-05-17 LAB — BASIC METABOLIC PANEL
ANION GAP: 12 (ref 5–15)
BUN: 62 mg/dL — ABNORMAL HIGH (ref 6–20)
CHLORIDE: 109 mmol/L (ref 101–111)
CO2: 23 mmol/L (ref 22–32)
Calcium: 8 mg/dL — ABNORMAL LOW (ref 8.9–10.3)
Creatinine, Ser: 11.59 mg/dL — ABNORMAL HIGH (ref 0.61–1.24)
GFR calc Af Amer: 5 mL/min — ABNORMAL LOW (ref >60)
GFR calc non Af Amer: 4 mL/min — ABNORMAL LOW (ref >60)
GLUCOSE: 148 mg/dL — AB (ref 65–99)
POTASSIUM: 4.9 mmol/L (ref 3.5–5.1)
Sodium: 144 mmol/L (ref 135–145)

## 2017-05-17 LAB — PHOSPHORUS: Phosphorus: 5.3 mg/dL — ABNORMAL HIGH (ref 2.5–4.6)

## 2017-05-17 LAB — CBC
HCT: 24.4 % — ABNORMAL LOW (ref 39.0–52.0)
HEMOGLOBIN: 7.8 g/dL — AB (ref 13.0–17.0)
MCH: 29.1 pg (ref 26.0–34.0)
MCHC: 32 g/dL (ref 30.0–36.0)
MCV: 91 fL (ref 78.0–100.0)
Platelets: 213 10*3/uL (ref 150–400)
RBC: 2.68 MIL/uL — AB (ref 4.22–5.81)
RDW: 17.6 % — ABNORMAL HIGH (ref 11.5–15.5)
WBC: 8.4 10*3/uL (ref 4.0–10.5)

## 2017-05-17 LAB — GLUCOSE, CAPILLARY
GLUCOSE-CAPILLARY: 151 mg/dL — AB (ref 65–99)
Glucose-Capillary: 143 mg/dL — ABNORMAL HIGH (ref 65–99)
Glucose-Capillary: 102 mg/dL — ABNORMAL HIGH (ref 65–99)
Glucose-Capillary: 156 mg/dL — ABNORMAL HIGH (ref 65–99)

## 2017-05-17 LAB — HEMOGLOBIN AND HEMATOCRIT, BLOOD
HEMATOCRIT: 21.2 % — AB (ref 39.0–52.0)
HEMATOCRIT: 19.5 % — AB (ref 39.0–52.0)
HEMOGLOBIN: 7 g/dL — AB (ref 13.0–17.0)
HEMOGLOBIN: 6.4 g/dL — AB (ref 13.0–17.0)

## 2017-05-17 LAB — MAGNESIUM: Magnesium: 2.1 mg/dL (ref 1.7–2.4)

## 2017-05-17 LAB — MRSA PCR SCREENING: MRSA BY PCR: NEGATIVE

## 2017-05-17 LAB — PREPARE RBC (CROSSMATCH)

## 2017-05-17 MED ORDER — MIDODRINE HCL 5 MG PO TABS
10.0000 mg | ORAL_TABLET | ORAL | Status: DC
  Administered 2017-05-17 – 2017-05-21 (×2): 10 mg via ORAL
  Filled 2017-05-17 (×2): qty 2

## 2017-05-17 MED ORDER — SODIUM CHLORIDE 0.9 % IV SOLN: 100.0000 mL | INTRAVENOUS | Status: DC | PRN

## 2017-05-17 MED ORDER — DARBEPOETIN ALFA 200 MCG/0.4ML IJ SOSY
200.0000 ug | PREFILLED_SYRINGE | INTRAMUSCULAR | Status: DC
  Administered 2017-05-17: 200 ug via INTRAVENOUS
  Filled 2017-05-17: qty 0.4

## 2017-05-17 MED ORDER — DOXERCALCIFEROL 4 MCG/2ML IV SOLN
INTRAVENOUS | Status: AC
  Administered 2017-05-17: 6 ug via INTRAVENOUS
  Filled 2017-05-17: qty 4

## 2017-05-17 MED ORDER — PENTAFLUOROPROP-TETRAFLUOROETH EX AERO: 1.0000 "application " | INHALATION_SPRAY | CUTANEOUS | Status: DC | PRN

## 2017-05-17 MED ORDER — PANTOPRAZOLE SODIUM 40 MG IV SOLR
40.0000 mg | Freq: Every day | INTRAVENOUS | Status: DC
  Administered 2017-05-17 – 2017-05-19 (×3): 40 mg via INTRAVENOUS
  Filled 2017-05-17 (×4): qty 40

## 2017-05-17 MED ORDER — SODIUM CHLORIDE 0.9 % IV SOLN: Freq: Once | INTRAVENOUS | Status: DC

## 2017-05-17 MED ORDER — ACETAMINOPHEN 325 MG PO TABS
650.0000 mg | ORAL_TABLET | Freq: Three times a day (TID) | ORAL | Status: DC | PRN
  Administered 2017-05-17: 650 mg via ORAL
  Filled 2017-05-17: qty 2

## 2017-05-17 MED ORDER — SODIUM CHLORIDE 0.9 % IV SOLN
100.0000 mL | INTRAVENOUS | Status: DC | PRN
  Administered 2017-05-21: 09:00:00 via INTRAVENOUS

## 2017-05-17 MED ORDER — LIDOCAINE-PRILOCAINE 2.5-2.5 % EX CREA: 1.0000 "application " | TOPICAL_CREAM | CUTANEOUS | Status: DC | PRN

## 2017-05-17 MED ORDER — MIDODRINE HCL 5 MG PO TABS
ORAL_TABLET | ORAL | Status: AC
  Administered 2017-05-17: 10 mg via ORAL
  Filled 2017-05-17: qty 2

## 2017-05-17 MED ORDER — LANTHANUM CARBONATE 500 MG PO CHEW
1000.0000 mg | CHEWABLE_TABLET | Freq: Three times a day (TID) | ORAL | Status: DC
  Administered 2017-05-17 – 2017-05-22 (×14): 1000 mg via ORAL
  Filled 2017-05-17 (×15): qty 2

## 2017-05-17 MED ORDER — TECHNETIUM TC 99M-LABELED RED BLOOD CELLS IV KIT
25.0000 | PACK | Freq: Once | INTRAVENOUS | Status: AC | PRN
  Administered 2017-05-17: 25 via INTRAVENOUS

## 2017-05-17 MED ORDER — DOXERCALCIFEROL 4 MCG/2ML IV SOLN
6.0000 ug | INTRAVENOUS | Status: DC
  Administered 2017-05-17 – 2017-05-21 (×3): 6 ug via INTRAVENOUS
  Filled 2017-05-17 (×2): qty 4

## 2017-05-17 MED ORDER — ORAL CARE MOUTH RINSE
15.0000 mL | Freq: Two times a day (BID) | OROMUCOSAL | Status: DC
  Administered 2017-05-18 – 2017-05-22 (×6): 15 mL via OROMUCOSAL

## 2017-05-17 MED ORDER — CHLORHEXIDINE GLUCONATE 0.12 % MT SOLN
15.0000 mL | Freq: Two times a day (BID) | OROMUCOSAL | Status: DC
  Administered 2017-05-17 – 2017-05-22 (×8): 15 mL via OROMUCOSAL
  Filled 2017-05-17 (×4): qty 15

## 2017-05-17 MED ORDER — DARBEPOETIN ALFA 200 MCG/0.4ML IJ SOSY
PREFILLED_SYRINGE | INTRAMUSCULAR | Status: AC
  Administered 2017-05-17: 200 ug via INTRAVENOUS
  Filled 2017-05-17: qty 0.4

## 2017-05-17 MED ORDER — RENA-VITE PO TABS
1.0000 | ORAL_TABLET | Freq: Every day | ORAL | Status: DC
  Administered 2017-05-17 – 2017-05-21 (×5): 1 via ORAL
  Filled 2017-05-17 (×6): qty 1

## 2017-05-17 MED ORDER — LIDOCAINE HCL (PF) 1 % IJ SOLN
5.0000 mL | INTRAMUSCULAR | Status: DC | PRN
  Filled 2017-05-17: qty 5

## 2017-05-17 MED ORDER — CITALOPRAM HYDROBROMIDE 20 MG PO TABS
10.0000 mg | ORAL_TABLET | Freq: Every day | ORAL | Status: DC
  Administered 2017-05-17 – 2017-05-22 (×6): 10 mg via ORAL
  Filled 2017-05-17 (×6): qty 1

## 2017-05-17 MED ORDER — NAPHAZOLINE-GLYCERIN 0.012-0.2 % OP SOLN
1.0000 [drp] | Freq: Four times a day (QID) | OPHTHALMIC | Status: DC | PRN
  Administered 2017-05-17 – 2017-05-21 (×3): 2 [drp] via OPHTHALMIC
  Filled 2017-05-17 (×2): qty 15

## 2017-05-17 MED ORDER — GABAPENTIN 300 MG PO CAPS
300.0000 mg | ORAL_CAPSULE | Freq: Every day | ORAL | Status: DC
  Administered 2017-05-17 – 2017-05-20 (×4): 300 mg via ORAL
  Filled 2017-05-17 (×5): qty 1

## 2017-05-17 NOTE — Progress Notes (Signed)
eLink Physician-Brief Progress Note Patient Name: HAEDYN ANCRUM DOB: 05-23-1951 MRN: [id number]   Date of Service  05/17/2017  HPI/Events of Note  C/O headache Dry eyes  eICU Interventions  Tylenol Eye drops        Prabhjot Maddux 05/17/2017, 8:04 PM

## 2017-05-17 NOTE — Consult Note (Signed)
Subjective:   HPI  The patient is a 66 year old male with multiple medical problems who came into the hospital yesterday with significant lower gastrointestinal bleeding characterized by bloody stools. On July 27 he had a colonoscopy which showed a small polyp in the ascending colon that was removed, he was also found to have diverticulosis of the entire colon. He was also found to have to check GI in the rectum consistent with radiation proctitis. He did undergo argon plasma coagulation of these lesions in the rectum. He presents to the hospital again with lower gastrointestinal bleeding. Last evening he had a nuclear medicine GI bleeding scan which was negative. The patient states he has not had any bleeding since last night.    Past Medical History:  Diagnosis Date  . Allergic rhinitis, cause unspecified 02/24/2014  . Anemia 06/16/2011  . BENIGN PROSTATIC HYPERTROPHY 10/14/2009  . CAD, NATIVE VESSEL 02/06/2009   a. 06/2007 s/p Taxus DES to the RCA;  b. 08/2016 NSTEMI in setting of SVT/PCI: LM 30ost, LAD 59m (3.0x16 Synergy DES), LCX 36m, OM1 60, RI 40, RCA 70p/m, 74m - not amenable to PCI.  [name] Cervical radiculopathy, chronic 02/23/2016   Right c5-6 by NCS/EMG  . CHEST PAIN 03/29/2010  . Chronic combined systolic (congestive) and diastolic (congestive) heart failure ([place])    a. 10/2016 Echo: EF 40-45%, Gr2 DD. mildly dil LA.  [name] COLONIC POLYPS, HX OF 10/14/2009  . Dementia [id number]  . DEPRESSION 10/14/2009  . Depression 09/24/2015  . DIABETES MELLITUS, TYPE II 02/01/2010  . DIZZINESS 07/17/2010  . DYSLIPIDEMIA 06/18/2007  . ESRD (end stage renal disease) on dialysis ([place]) 08/04/2010   "TTS;  " (04/18/2015)  . FOOT PAIN 08/12/2008  . GAIT DISTURBANCE 03/03/2010  . GASTROENTERITIS, VIRAL 10/14/2009  . GERD 06/18/2007  . GOITER, MULTINODULAR 12/26/2007  . GOUT 06/18/2007  . GYNECOMASTIA 07/17/2010  . Hemodialysis access, fistula mature [facility])    Dialysis T-Th-Sa ([place]) Right upper  arm fistula  . Hyperlipidemia 10/16/2011  . Hyperparathyroidism, secondary ([place]) 06/16/2011  . HYPERTENSION 06/18/2007  . Hyperthyroidism   . Hypocalcemia 06/07/2010  . Ischemic cardiomyopathy    a. 10/2016 Echo: EF 40-45%.  [name] NECK PAIN 07/31/2010  . ONYCHOMYCOSIS, TOENAILS 12/26/2007  . OSA on CPAP 10/16/2011  . Other malaise and fatigue 11/24/2009  . PERIPHERAL NEUROPATHY 06/18/2007  . Prostate cancer ([place])   . PSVT (paroxysmal supraventricular tachycardia) ([place])    a. 44/8185 complicated by NSTEMI;  b. 11/2016 Treated w/ adenosine in ED;  c. 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  [name] PULMONARY NODULE, RIGHT LOWER LOBE 06/08/2009  . Sleep apnea    cpap machine and o2  . TRANSAMINASES, SERUM, ELEVATED 02/01/2010  . Transfusion history    none recent  . Unspecified hypotension 01/30/2010   Past Surgical History:  Procedure Laterality Date  . ARTERIOVENOUS GRAFT PLACEMENT Right 2009   forearm/notes 02/01/2011  . AV FISTULA PLACEMENT  11/07/2011   Procedure: INSERTION OF ARTERIOVENOUS (AV) GORE-TEX GRAFT ARM;  Surgeon: Tinnie Gens, MD;  Location: [place];  Service: Vascular;  Laterality: Left;  . BACK SURGERY  1998  . BASCILIC VEIN TRANSPOSITION Right 02/27/2013   Procedure: BASCILIC VEIN TRANSPOSITION;  Surgeon: Mal Misty, MD;  Location: [place];  Service: Vascular;  Laterality: Right;  Right Basilic Vein Transposition   . CARDIAC CATHETERIZATION N/A 08/06/2016   Procedure: Left Heart Cath and Coronary Angiography;  Surgeon: Jolaine Artist, MD;  Location: [place] CV LAB;  Service: Cardiovascular;  Laterality: N/A;  . CARDIAC CATHETERIZATION N/A 08/07/2016   Procedure: Coronary/Graft Atherectomy-CSI LAD;  Surgeon: Peter M Martinique, MD;  Location: [place] CV LAB;  Service: Cardiovascular;  Laterality: N/A;  . CERVICAL SPINE SURGERY  2/09   "to repair nerve problems in my left arm"  . CHOLECYSTECTOMY    . COLONOSCOPY WITH PROPOFOL N/A 04/26/2017   Procedure: COLONOSCOPY WITH PROPOFOL;  Surgeon:  Otis Brace, MD;  Location: [place];  Service: Gastroenterology;  Laterality: N/A;  . CORONARY ANGIOPLASTY WITH STENT PLACEMENT  06/11/2008  . CORONARY ANGIOPLASTY WITH STENT PLACEMENT  06/2007   TAXUS stent to RCA/notes 01/31/2011  . ESOPHAGOGASTRODUODENOSCOPY  09/28/2011   Procedure: ESOPHAGOGASTRODUODENOSCOPY (EGD);  Surgeon: Missy Sabins, MD;  Location[facility] ENDOSCOPY;  Service: Endoscopy;  Laterality: N/A;  . ESOPHAGOGASTRODUODENOSCOPY N/A 04/07/2015   Procedure: ESOPHAGOGASTRODUODENOSCOPY (EGD);  Surgeon: Teena Irani, MD;  Location: [facility];  Service: Endoscopy;  Laterality: N/A;  . ESOPHAGOGASTRODUODENOSCOPY N/A 04/19/2015   Procedure: ESOPHAGOGASTRODUODENOSCOPY (EGD);  Surgeon: Arta Silence, MD;  Location: [facility] ENDOSCOPY;  Service: Endoscopy;  Laterality: N/A;  . FOREIGN BODY REMOVAL  09/2003   via upper endoscopy/notes 02/12/2011  . GIVENS CAPSULE STUDY  09/30/2011   Procedure: GIVENS CAPSULE STUDY;  Surgeon: Jeryl Columbia, MD;  Location: [facility] ENDOSCOPY;  Service: Endoscopy;  Laterality: N/A;  . INSERTION OF DIALYSIS CATHETER Right 2014  . INSERTION OF DIALYSIS CATHETER Left 02/11/2013   Procedure: INSERTION OF DIALYSIS CATHETER;  Surgeon: Conrad Longbranch, MD;  Location: [place];  Service: Vascular;  Laterality: Left;  Ultrasound guided  . REMOVAL OF A DIALYSIS CATHETER Right 02/11/2013   Procedure: REMOVAL OF A DIALYSIS CATHETER;  Surgeon: Conrad Des Plaines, MD;  Location: [place];  Service: Vascular;  Laterality: Right;  . SAVORY DILATION N/A 04/07/2015   Procedure: SAVORY DILATION;  Surgeon: Teena Irani, MD;  Location: [facility] ENDOSCOPY;  Service: Endoscopy;  Laterality: N/A;  . SHUNTOGRAM N/A 09/20/2011   Procedure: Earney Mallet;  Surgeon: Conrad Bronson, MD;  Location: [facility] CATH LAB;  Service: Cardiovascular;  Laterality: N/A;  . SVT ABLATION N/A 11/26/2016   Procedure: SVT Ablation;  Surgeon: Will Meredith Leeds, MD;  Location: [place] CV LAB;  Service: Cardiovascular;  Laterality: N/A;  .  TONSILLECTOMY    . TOTAL KNEE ARTHROPLASTY Right 08/02/2015   Procedure: TOTAL KNEE ARTHROPLASTY;  Surgeon: Renette Butters, MD;  Location: [place];  Service: Orthopedics;  Laterality: Right;  . VENOGRAM N/A 01/26/2013   Procedure: VENOGRAM;  Surgeon: Angelia Mould, MD;  Location: [facility] CATH LAB;  Service: Cardiovascular;  Laterality: N/A;   Social History   Social History  . Marital status: Married    Spouse name: N/A  . Number of children: 3  . Years of education: N/A   Occupational History  . 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    Social History Main Topics  . Smoking status: Former Smoker    Packs/day: 1.00    Years: 25.00    Types: Cigarettes    Quit date: 10/01/2005  . Smokeless tobacco: Former Systems developer    Quit date: 10/01/2005     Comment: Quit smoking 2007 Smoked x 25 years 1/2 ppd.  . Alcohol use No  . Drug use: No  . Sexual activity: No   Other Topics Concern  . Not on file   Social History Narrative  . No narrative on file   family history includes Diabetes in his father; Healthy in his child, child, and  child; Heart disease in his father and sister; Hypertension in his father; Kidney failure in his father; Thyroid nodules in his sister.  Current Facility-Administered Medications:  .  0.9 %  sodium chloride infusion, , Intravenous, Once, Corey Harold, NP .  chlorhexidine (PERIDEX) 0.12 % solution 15 mL, 15 mL, Mouth Rinse, BID, Zaaqoq, Elveria Rising, MD, 15 mL at 05/17/17 0933 .  citalopram (CELEXA) tablet 10 mg, 10 mg, Oral, Daily, Raylene Miyamoto, MD .  Darbepoetin Alfa (ARANESP) injection 200 mcg, 200 mcg, Intravenous, Q Fri-HD, Ejigiri, Ogechi Grace, PA-C .  [START ON 05/18/2017] doxercalciferol ([name]) injection 6 mcg, 6 mcg, Intravenous, Q T,Th,Sa-HD, Ejigiri, Thomos Lemons, PA-C .  gabapentin (NEURONTIN) capsule 300 mg, 300 mg, Oral, QHS, Raylene Miyamoto, MD .  lanthanum [facility]) chewable tablet 1,000 mg, 1,000 mg, Oral, TID  WC, Ejigiri, Thomos Lemons, PA-C .  MEDLINE mouth rinse, 15 mL, Mouth Rinse, q12n4p, Zaaqoq, Elveria Rising, MD .  Derrill Memo ON 05/18/2017] midodrine (PROAMATINE) tablet 10 mg, 10 mg, Oral, Q T,Th,Sat-1800, Raylene Miyamoto, MD .  multivitamin (RENA-VIT) tablet 1 tablet, 1 tablet, Oral, QHS, Ejigiri, Thomos Lemons, PA-C .  pantoprazole (PROTONIX) injection 40 mg, 40 mg, Intravenous, Daily, Jennelle Human B, NP, 40 mg at 05/17/17 8016 Allergies  Allergen Reactions  . Cephalexin Swelling and Other (See Comments)    Tongue swelling  . Statins Other (See Comments)    Weak muscles  . Ciprofloxacin Rash     Objective:     BP 138/63   Pulse 75   Temp 98.5 F (36.9 C) (Oral)   Resp 15   Ht 6\' 1"  (1.854 m)   Wt 107.4 kg (236 lb 12.4 oz)   SpO2 99%   BMI 31.24 kg/m   No distress  Nonicteric  Heart regular rhythm no murmurs  Lungs clear  Abdomen soft and nontender  Laboratory No components found for: D1    Assessment:     Lower GI bleed. Given the degree of bleeding it sounds like diverticular in origin. Clinically this has stopped. Nuclear medicine GI bleeding scan was negative.      Plan:     I would recommend conservative management and supportive care at this time. Follow hemoglobin and hematocrit and transfuse if needed. If he has further significant lower GI bleeding I would recommend that he have an emergent nuclear medicine GI bleeding scan to try and localize this area and if it is positive I would ask interventional radiology to do angiogram with embolization. At this point I do not think he needs another colonoscopy. Lab Results  Component Value Date   HGB 7.0 (L) 05/17/2017   HGB 9.1 (L) 05/16/2017   HGB 10.5 (L) 05/03/2017   HGB 11.7 (L) 11/20/2016   HCT 21.2 (L) 05/17/2017   HCT 29.3 (L) 05/16/2017   HCT 33.3 (L) 05/03/2017   HCT 35.6 (L) 11/20/2016   ALKPHOS 88 04/22/2017   ALKPHOS 65 12/27/2016   ALKPHOS 101 10/16/2016   AST 22 04/22/2017   AST 16 12/27/2016    AST 37 10/16/2016   ALT 19 04/22/2017   ALT 16 (L) 12/27/2016   ALT 26 10/16/2016   AMYLASE 60 10/31/2015   AMYLASE 37 12/28/2009

## 2017-05-17 NOTE — Progress Notes (Signed)
Pt. Has home cpap. RT set up pt. cpap for him and put sterile water in chamber. Pt. States he can place his cpap on himself.

## 2017-05-17 NOTE — Consult Note (Signed)
Chief Complaint: GI bleed  Referring Physician: Dr. Nanda Quinton  Supervising Physician: Corrie Mckusick  Patient Status: [facility] - In-pt  HPI: [name] is a 66 y.o. male with a history of ESRD, CAD, NSTEMI s/p PCI on plavix and ASA, DM, HTN, etc who presented to the ED with BRBPR.  He has a history of radiation proctitis.  He was admitted in in late July with the same problem.  He underwent a colonoscopy at that time which revealed hemorrhoids that received argon coagulation.  He was also found to have a polyp which was removed and diverticulosis.  He improved that admission and was discharged.  He presented to the ED last night with 4 large bloody BMs passing large clots. He did get hypotensive and had a syncopal episode.  He was transfused.  GI was called and did not feel at that time he needed a colonoscopy.  IR was consulted as well.  The ultimate recommendation was for a nuc med bleeding scan.  This was negative.  The patient has not had any further bloody BMs since early last night.  He is stable this morning.    Past Medical History:  Past Medical History:  Diagnosis Date  . Allergic rhinitis, cause unspecified 02/24/2014  . Anemia 06/16/2011  . BENIGN PROSTATIC HYPERTROPHY 10/14/2009  . CAD, NATIVE VESSEL 02/06/2009   a. 06/2007 s/p Taxus DES to the RCA;  b. 08/2016 NSTEMI in setting of SVT/PCI: LM 30ost, LAD 4m(3.0x16 Synergy DES), LCX 48mOM1 60, RI 40, RCA 70p/m, 8065mnot amenable to PCI.  . CMarland Kitchenrvical radiculopathy, chronic 02/23/2016   Right c5-6 by NCS/EMG  . CHEST PAIN 03/29/2010  . Chronic combined systolic (congestive) and diastolic (congestive) heart failure (HCC[place]  a. 10/2016 Echo: EF 40-45%, Gr2 DD. mildly dil LA.  . CMarland KitchenLONIC POLYPS, HX OF 10/14/2009  . Dementia [id number] DEPRESSION 10/14/2009  . Depression 09/24/2015  . DIABETES MELLITUS, TYPE II 02/01/2010  . DIZZINESS 07/17/2010  . DYSLIPIDEMIA 06/18/2007  . ESRD (end stage renal disease) on dialysis ([place]CC[place]11/12/2009   "TTS;  " (04/18/2015)  . FOOT PAIN 08/12/2008  . GAIT DISTURBANCE 03/03/2010  . GASTROENTERITIS, VIRAL 10/14/2009  . GERD 06/18/2007  . GOITER, MULTINODULAR 12/26/2007  . GOUT 06/18/2007  . GYNECOMASTIA 07/17/2010  . Hemodialysis access, fistula mature ([facility] upper arm fistula  . Hyperlipidemia 10/16/2011  . Hyperparathyroidism, secondary (HCC[place]/15/2012  . HYPERTENSION 06/18/2007  . Hyperthyroidism   . Hypocalcemia 06/07/2010  . Ischemic cardiomyopathy    a. 10/2016 Echo: EF 40-45%.  . NMarland KitchenCK PAIN 07/31/2010  . ONYCHOMYCOSIS, TOENAILS 12/26/2007  . OSA on CPAP 10/16/2011  . Other malaise and fatigue 11/24/2009  . PERIPHERAL NEUROPATHY 06/18/2007  . Prostate cancer ([place]CC[place] . PSVT (paroxysmal supraventricular tachycardia) (HCC[place]  a. 11/88/7579mplicated by NSTEMI;  b. 11/2016 Treated w/ adenosine in ED;  c. 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  . PMarland KitchenLMONARY NODULE, RIGHT LOWER LOBE 06/08/2009  . Sleep apnea    cpap machine and o2  . TRANSAMINASES, SERUM, ELEVATED 02/01/2010  . Transfusion history    none recent  . Unspecified hypotension 01/30/2010    Past Surgical History:  Past Surgical History:  Procedure Laterality Date  . ARTERIOVENOUS GRAFT PLACEMENT Right 2009   forearm/notes 02/01/2011  . AV FISTULA PLACEMENT  11/07/2011   Procedure: INSERTION OF ARTERIOVENOUS (AV) GORE-TEX GRAFT ARM;  Surgeon:  Tinnie Gens, MD;  Location: [place];  Service: Vascular;  Laterality: Left;  . BACK SURGERY  1998  . BASCILIC VEIN TRANSPOSITION Right 02/27/2013   Procedure: BASCILIC VEIN TRANSPOSITION;  Surgeon: Mal Misty, MD;  Location: [place];  Service: Vascular;  Laterality: Right;  Right Basilic Vein Transposition   . CARDIAC CATHETERIZATION N/A 08/06/2016   Procedure: Left Heart Cath and Coronary Angiography;  Surgeon: Jolaine Artist, MD;  Location: [place] CV LAB;  Service: Cardiovascular;  Laterality: N/A;  . CARDIAC CATHETERIZATION N/A  08/07/2016   Procedure: Coronary/Graft Atherectomy-CSI LAD;  Surgeon: Peter M Martinique, MD;  Location: [place] CV LAB;  Service: Cardiovascular;  Laterality: N/A;  . CERVICAL SPINE SURGERY  2/09   "to repair nerve problems in my left arm"  . CHOLECYSTECTOMY    . COLONOSCOPY WITH PROPOFOL N/A 04/26/2017   Procedure: COLONOSCOPY WITH PROPOFOL;  Surgeon: Otis Brace, MD;  Location: [name];  Service: Gastroenterology;  Laterality: N/A;  . CORONARY ANGIOPLASTY WITH STENT PLACEMENT  06/11/2008  . CORONARY ANGIOPLASTY WITH STENT PLACEMENT  06/2007   TAXUS stent to RCA/notes 01/31/2011  . ESOPHAGOGASTRODUODENOSCOPY  09/28/2011   Procedure: ESOPHAGOGASTRODUODENOSCOPY (EGD);  Surgeon: Missy Sabins, MD;  Location: [facility] ENDOSCOPY;  Service: Endoscopy;  Laterality: N/A;  . ESOPHAGOGASTRODUODENOSCOPY N/A 04/07/2015   Procedure: ESOPHAGOGASTRODUODENOSCOPY (EGD);  Surgeon: Teena Irani, MD;  Location: [facility];  Service: Endoscopy;  Laterality: N/A;  . ESOPHAGOGASTRODUODENOSCOPY N/A 04/19/2015   Procedure: ESOPHAGOGASTRODUODENOSCOPY (EGD);  Surgeon: Arta Silence, MD;  Location: [facility] ENDOSCOPY;  Service: Endoscopy;  Laterality: N/A;  . FOREIGN BODY REMOVAL  09/2003   via upper endoscopy/notes 02/12/2011  . GIVENS CAPSULE STUDY  09/30/2011   Procedure: GIVENS CAPSULE STUDY;  Surgeon: Jeryl Columbia, MD;  Location: [facility] ENDOSCOPY;  Service: Endoscopy;  Laterality: N/A;  . INSERTION OF DIALYSIS CATHETER Right 2014  . INSERTION OF DIALYSIS CATHETER Left 02/11/2013   Procedure: INSERTION OF DIALYSIS CATHETER;  Surgeon: Conrad Meridian, MD;  Location: [place];  Service: Vascular;  Laterality: Left;  Ultrasound guided  . REMOVAL OF A DIALYSIS CATHETER Right 02/11/2013   Procedure: REMOVAL OF A DIALYSIS CATHETER;  Surgeon: Conrad Boonton, MD;  Location: [place];  Service: Vascular;  Laterality: Right;  . SAVORY DILATION N/A 04/07/2015   Procedure: SAVORY DILATION;  Surgeon: Teena Irani, MD;  Location: [facility] ENDOSCOPY;  Service:  Endoscopy;  Laterality: N/A;  . SHUNTOGRAM N/A 09/20/2011   Procedure: Earney Mallet;  Surgeon: Conrad O'Fallon, MD;  Location: [facility] CATH LAB;  Service: Cardiovascular;  Laterality: N/A;  . SVT ABLATION N/A 11/26/2016   Procedure: SVT Ablation;  Surgeon: Will Meredith Leeds, MD;  Location: [place] CV LAB;  Service: Cardiovascular;  Laterality: N/A;  . TONSILLECTOMY    . TOTAL KNEE ARTHROPLASTY Right 08/02/2015   Procedure: TOTAL KNEE ARTHROPLASTY;  Surgeon: Renette Butters, MD;  Location: [place];  Service: Orthopedics;  Laterality: Right;  . VENOGRAM N/A 01/26/2013   Procedure: VENOGRAM;  Surgeon: Angelia Mould, MD;  Location[facility] C[facility] LAB;  Service: Cardiovascular;  Laterality: N/A;    Family History:  Family History  Problem Relation Age of Onset  . Heart disease Sister   . Thyroid nodules Sister   . Heart disease Father   . Diabetes Father   . Kidney failure Father   . Hypertension Father   . Healthy Child   . Healthy Child   . Healthy Child   . Cancer Neg Hx  Social History:  reports that he quit smoking about 11 years ago. His smoking use included Cigarettes. He has a 25.00 pack-year smoking history. He quit smokeless tobacco use about 11 years ago. He reports that he does not drink alcohol or use drugs.  Allergies:  Allergies  Allergen Reactions  . Cephalexin Swelling and Other (See Comments)    Tongue swelling  . Statins Other (See Comments)    Weak muscles  . Ciprofloxacin Rash    Medications: Medications reviewed in epic  Please HPI for pertinent positives, otherwise complete 10 system ROS negative.    Physical Exam: BP (!) 122/46   Pulse 87   Temp 98.6 F (37 C) (Oral)   Resp (!) 30   Ht 6' 1"  (1.854 m)   Wt 236 lb 12.4 oz (107.4 kg)   SpO2 100%   BMI 31.24 kg/m  Body mass index is 31.24 kg/m. General: WD, WN black male who is laying in bed in NAD, but upset over the events of last night HEENT: head is normocephalic, atraumatic.  Sclera are  noninjected.  PERRL.  Ears and nose without any masses or lesions.  Mouth is pink and moist Heart: regular, rate, and rhythm.  Normal s1,s2. No obvious murmurs, gallops, or rubs noted.  Palpable radial and pedal pulses bilaterally Lungs: CTAB, no wheezes, rhonchi, or rales noted.  Respiratory effort nonlabored Abd: soft, NT, ND, +BS, no masses, hernias, or organomegaly Psych: A&Ox3 but upset.   Labs: Results for orders placed or performed during the hospital encounter of 05/16/17 (from the past 48 hour(s))  Basic metabolic panel     Status: Abnormal   Collection Time: 05/16/17  9:15 PM  Result Value Ref Range   Sodium 143 135 - 145 mmol/L   Potassium 4.6 3.5 - 5.1 mmol/L   Chloride 105 101 - 111 mmol/L   CO2 24 22 - 32 mmol/L   Glucose, Bld 145 (H) 65 - 99 mg/dL   BUN 58 (H) 6 - 20 mg/dL   Creatinine, Ser 11.42 (H) 0.61 - 1.24 mg/dL   Calcium 8.8 (L) 8.9 - 10.3 mg/dL   GFR calc non Af Amer 4 (L) >60 mL/min   GFR calc Af Amer 5 (L) >60 mL/min    Comment: (NOTE) The eGFR has been calculated using the CKD EPI equation. This calculation has not been validated in all clinical situations. eGFR's persistently <60 mL/min signify possible Chronic Kidney Disease.    Anion gap 14 5 - 15  CBC     Status: Abnormal   Collection Time: 05/16/17  9:15 PM  Result Value Ref Range   WBC 7.4 4.0 - 10.5 K/uL   RBC 3.15 (L) 4.22 - 5.81 MIL/uL   Hemoglobin 9.1 (L) 13.0 - 17.0 g/dL   HCT 29.3 (L) 39.0 - 52.0 %   MCV 93.0 78.0 - 100.0 fL   MCH 28.9 26.0 - 34.0 pg   MCHC 31.1 30.0 - 36.0 g/dL   RDW 18.5 (H) 11.5 - 15.5 %   Platelets 328 150 - 400 K/uL  Type and screen [place]     Status: None   Collection Time: 05/16/17  9:15 PM  Result Value Ref Range   ABO/RH(D) O POS    Antibody Screen NEG    Sample Expiration 05/19/2017    Unit Number [id number]    Blood Component Type RED CELLS,LR    Unit division 00    Status of Unit ISSUED,FINAL    Transfusion  Status OK TO  TRANSFUSE    Crossmatch Result Compatible   I-Stat CG4 Lactic Acid, ED     Status: None   Collection Time: 05/16/17  9:30 PM  Result Value Ref Range   Lactic Acid, Venous 1.54 0.5 - 1.9 mmol/L  CBG monitoring, ED     Status: Abnormal   Collection Time: 05/16/17 10:08 PM  Result Value Ref Range   Glucose-Capillary 126 (H) 65 - 99 mg/dL  Prepare RBC     Status: None   Collection Time: 05/16/17 10:53 PM  Result Value Ref Range   Order Confirmation ORDER PROCESSED BY BLOOD BANK   Prepare Pheresed Platelets     Status: None (Preliminary result)   Collection Time: 05/16/17 11:30 PM  Result Value Ref Range   Unit Number [id number]    Blood Component Type PLTPHER LR2    Unit division 00    Status of Unit ISSUED,FINAL    Transfusion Status OK TO TRANSFUSE    Unit Number [id number]    Blood Component Type PLTPHER LR1    Unit division 00    Status of Unit ISSUED    Transfusion Status OK TO TRANSFUSE   MRSA PCR Screening     Status: None   Collection Time: 05/17/17  1:51 AM  Result Value Ref Range   MRSA by PCR NEGATIVE NEGATIVE    Comment:        The GeneXpert MRSA Assay (FDA approved for NASAL specimens only), is one component of a comprehensive MRSA colonization surveillance program. It is not intended to diagnose MRSA infection nor to guide or monitor treatment for MRSA infections.   Glucose, capillary     Status: Abnormal   Collection Time: 05/17/17  5:56 AM  Result Value Ref Range   Glucose-Capillary 143 (H) 65 - 99 mg/dL   Comment 1 Document in Chart   Basic metabolic panel     Status: Abnormal   Collection Time: 05/17/17  7:15 AM  Result Value Ref Range   Sodium 144 135 - 145 mmol/L   Potassium 4.9 3.5 - 5.1 mmol/L   Chloride 109 101 - 111 mmol/L   CO2 23 22 - 32 mmol/L   Glucose, Bld 148 (H) 65 - 99 mg/dL   BUN 62 (H) 6 - 20 mg/dL   Creatinine, Ser 11.59 (H) 0.61 - 1.24 mg/dL   Calcium 8.0 (L) 8.9 - 10.3 mg/dL   GFR calc non Af Amer 4 (L) >60 mL/min   GFR  calc Af Amer 5 (L) >60 mL/min    Comment: (NOTE) The eGFR has been calculated using the CKD EPI equation. This calculation has not been validated in all clinical situations. eGFR's persistently <60 mL/min signify possible Chronic Kidney Disease.    Anion gap 12 5 - 15  Magnesium     Status: None   Collection Time: 05/17/17  7:15 AM  Result Value Ref Range   Magnesium 2.1 1.7 - 2.4 mg/dL  Phosphorus     Status: Abnormal   Collection Time: 05/17/17  7:15 AM  Result Value Ref Range   Phosphorus 5.3 (H) 2.5 - 4.6 mg/dL  Hemoglobin and hematocrit, blood     Status: Abnormal   Collection Time: 05/17/17  7:15 AM  Result Value Ref Range   Hemoglobin 7.0 (L) 13.0 - 17.0 g/dL    Comment: REPEATED TO VERIFY DELTA CHECK NOTED    HCT 21.2 (L) 39.0 - 52.0 %   *Note: Due to a large number of  results and/or encounters for the requested time period, some results have not been displayed. A complete set of results can be found in Results Review.    Imaging: Nm Gi Blood Loss  Result Date: 05/17/2017 CLINICAL DATA:  Recent colonoscopy with removal of polyps. Active GI bleeding. Initial encounter. EXAM: NUCLEAR MEDICINE GASTROINTESTINAL BLEEDING SCAN TECHNIQUE: Sequential abdominal images were obtained following intravenous administration of Tc-50mlabeled red blood cells. RADIOPHARMACEUTICALS:  26.2 mCi Tc-957mn-vitro labeled red cells. COMPARISON:  CT of the abdomen and pelvis performed 04/22/2017 FINDINGS: Normal blood pool activity is noted. There is accumulation of activity within the bladder, as expected. No abnormal focal accumulation of activity is seen to suggest active GI bleeding at this time. The study was continued for a total of 2 hours. IMPRESSION: No evidence of active GI bleeding on this study. Electronically Signed   By: JeGarald Balding.D.   On: 05/17/2017 05:56    Assessment/Plan 1. LGI bleed, etiology unclear  The patient has a history of radiation proctitis and so possible  source of bleeding is low rectum.  If the patient rebleeds, GI could certainly be considered for intervention with colonoscopy as this low is very difficult to access with angiography.  However, if this is not pursued, then a CTA BRTO protocol could be ordered to try and locate bleeding to determine if it is truly coming from his rectum or elsewhere in his colon given recent polypectomy and diverticulosis as well as use of Plavix and ASA.  No acute intervention with angiogram is warranted at this time as his vitals are stable and he is not actively bleeding.  Thank you for this interesting consult.  I greatly enjoyed meeting JaJAYMASON LEDESMAnd look forward to participating in their care.  A copy of this report was sent to the requesting provider on this date.  Electronically Signed: OSHenreitta Cea/17/2018, 9:26 AM   I spent a total of 40 Minutes    in face to face in clinical consultation, greater than 50% of which was counseling/coordinating care for LGI bleed

## 2017-05-17 NOTE — Consult Note (Signed)
[facility] Renal Consultation Note    Indication for Consultation:  Management of ESRD/hemodialysis; anemia, hypertension/volume and secondary hyperparathyroidism   HPI: [name] is a 66 y.o. male with ESRD on HD, DM, CAD ICM s/p RCA PTA, STEMI 2017 s/p LAD PCI stent ( on Plavix, ASA), OSA, depression anxiety. H/o of GI bleed. Recent [facility] admit 03/2017 with BRBPR. Had colonoscopy 04/26/17 which showed small polyp from ascending colon removed, diverticulosis of entire examined colon, petechiae in rectum consistent with radiation proctitis and treated with argon plasma coagulation,  internal hemorrhoids. Plavix resumed at discharge.   Presented to ED last night with bloody stool "leaking down my leg". Had multiple episodes of rectal bleeding in ED. Hgb 10.5 > 9.1>7.0. Noted to be hypotensive in ED. Transfused 1 unit PRBC and DDAVP in ED. Had negative nuclear RBC study.  Seen currently in room. BP stable 138/63 SpO2 98% HR 74. Bleeding has stopped. No further episodes since midnight. Feels weak and hungry. Says has some soreness in legs, but denies dizziness, CP, dyspnea, abdominal pain, N, V,D, edema.   Dialyzes at [facility] TTS. Last HD was Tuesday 8/14. Did not dialyze Thursday as technicians were unable to cannulate access and was referred to [facility] today to evaluate fistula. He denies access problems but says he needs expert cannulator who was not available on Thursday.     Past Medical History:  Diagnosis Date  . Allergic rhinitis, cause unspecified 02/24/2014  . Anemia 06/16/2011  . BENIGN PROSTATIC HYPERTROPHY 10/14/2009  . CAD, NATIVE VESSEL 02/06/2009   a. 06/2007 s/p Taxus DES to the RCA;  b. 08/2016 NSTEMI in setting of SVT/PCI: LM 30ost, LAD 62m (3.0x16 Synergy DES), LCX 64m, OM1 60, RI 40, RCA 70p/m, 91m - not amenable to PCI.  [name] Cervical radiculopathy, chronic 02/23/2016   Right c5-6 by NCS/EMG  . CHEST PAIN 03/29/2010  . Chronic combined systolic (congestive) and diastolic  (congestive) heart failure ([place])    a. 10/2016 Echo: EF 40-45%, Gr2 DD. mildly dil LA.  [name] COLONIC POLYPS, HX OF 10/14/2009  . Dementia [id number]  . DEPRESSION 10/14/2009  . Depression 09/24/2015  . DIABETES MELLITUS, TYPE II 02/01/2010  . DIZZINESS 07/17/2010  . DYSLIPIDEMIA 06/18/2007  . ESRD (end stage renal disease) on dialysis ([place]) 08/04/2010   "TTS;  " (04/18/2015)  . FOOT PAIN 08/12/2008  . GAIT DISTURBANCE 03/03/2010  . GASTROENTERITIS, VIRAL 10/14/2009  . GERD 06/18/2007  . GOITER, MULTINODULAR 12/26/2007  . GOUT 06/18/2007  . GYNECOMASTIA 07/17/2010  . Hemodialysis access, fistula mature [facility])    Dialysis T-Th-Sa ([place]) Right upper arm fistula  . Hyperlipidemia 10/16/2011  . Hyperparathyroidism, secondary ([place]) 06/16/2011  . HYPERTENSION 06/18/2007  . Hyperthyroidism   . Hypocalcemia 06/07/2010  . Ischemic cardiomyopathy    a. 10/2016 Echo: EF 40-45%.  [name] NECK PAIN 07/31/2010  . ONYCHOMYCOSIS, TOENAILS 12/26/2007  . OSA on CPAP 10/16/2011  . Other malaise and fatigue 11/24/2009  . PERIPHERAL NEUROPATHY 06/18/2007  . Prostate cancer ([place])   . PSVT (paroxysmal supraventricular tachycardia) ([place])    a. 89/2119 complicated by NSTEMI;  b. 11/2016 Treated w/ adenosine in ED;  c. 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  [name] PULMONARY NODULE, RIGHT LOWER LOBE 06/08/2009  . Sleep apnea    cpap machine and o2  . TRANSAMINASES, SERUM, ELEVATED 02/01/2010  . Transfusion history    none recent  . Unspecified hypotension 01/30/2010   Past Surgical History:  Procedure Laterality Date  . ARTERIOVENOUS  GRAFT PLACEMENT Right 2009   forearm/notes 02/01/2011  . AV FISTULA PLACEMENT  11/07/2011   Procedure: INSERTION OF ARTERIOVENOUS (AV) GORE-TEX GRAFT ARM;  Surgeon: Tinnie Gens, MD;  Location: [place];  Service: Vascular;  Laterality: Left;  . BACK SURGERY  1998  . BASCILIC VEIN TRANSPOSITION Right 02/27/2013   Procedure: BASCILIC VEIN TRANSPOSITION;  Surgeon: Mal Misty, MD;  Location: [place];   Service: Vascular;  Laterality: Right;  Right Basilic Vein Transposition   . CARDIAC CATHETERIZATION N/A 08/06/2016   Procedure: Left Heart Cath and Coronary Angiography;  Surgeon: Jolaine Artist, MD;  Location: [place] CV LAB;  Service: Cardiovascular;  Laterality: N/A;  . CARDIAC CATHETERIZATION N/A 08/07/2016   Procedure: Coronary/Graft Atherectomy-CSI LAD;  Surgeon: Peter M Martinique, MD;  Location: [place] CV LAB;  Service: Cardiovascular;  Laterality: N/A;  . CERVICAL SPINE SURGERY  2/09   "to repair nerve problems in my left arm"  . CHOLECYSTECTOMY    . COLONOSCOPY WITH PROPOFOL N/A 04/26/2017   Procedure: COLONOSCOPY WITH PROPOFOL;  Surgeon: Otis Brace, MD;  Location: [place];  Service: Gastroenterology;  Laterality: N/A;  . CORONARY ANGIOPLASTY WITH STENT PLACEMENT  06/11/2008  . CORONARY ANGIOPLASTY WITH STENT PLACEMENT  06/2007   TAXUS stent to RCA/notes 01/31/2011  . ESOPHAGOGASTRODUODENOSCOPY  09/28/2011   Procedure: ESOPHAGOGASTRODUODENOSCOPY (EGD);  Surgeon: Missy Sabins, MD;  Location: [facility] ENDOSCOPY;  Service: Endoscopy;  Laterality: N/A;  . ESOPHAGOGASTRODUODENOSCOPY N/A 04/07/2015   Procedure: ESOPHAGOGASTRODUODENOSCOPY (EGD);  Surgeon: Teena Irani, MD;  Location: [facility];  Service: Endoscopy;  Laterality: N/A;  . ESOPHAGOGASTRODUODENOSCOPY N/A 04/19/2015   Procedure: ESOPHAGOGASTRODUODENOSCOPY (EGD);  Surgeon: Arta Silence, MD;  Location: [facility] ENDOSCOPY;  Service: Endoscopy;  Laterality: N/A;  . FOREIGN BODY REMOVAL  09/2003   via upper endoscopy/notes 02/12/2011  . GIVENS CAPSULE STUDY  09/30/2011   Procedure: GIVENS CAPSULE STUDY;  Surgeon: Jeryl Columbia, MD;  Location: [facility] ENDOSCOPY;  Service: Endoscopy;  Laterality: N/A;  . INSERTION OF DIALYSIS CATHETER Right 2014  . INSERTION OF DIALYSIS CATHETER Left 02/11/2013   Procedure: INSERTION OF DIALYSIS CATHETER;  Surgeon: Conrad Mowbray Mountain, MD;  Location: [place];  Service: Vascular;  Laterality: Left;  Ultrasound  guided  . REMOVAL OF A DIALYSIS CATHETER Right 02/11/2013   Procedure: REMOVAL OF A DIALYSIS CATHETER;  Surgeon: Conrad Concord, MD;  Location: [place];  Service: Vascular;  Laterality: Right;  . SAVORY DILATION N/A 04/07/2015   Procedure: SAVORY DILATION;  Surgeon: Teena Irani, MD;  Location: [facility] ENDOSCOPY;  Service: Endoscopy;  Laterality: N/A;  . SHUNTOGRAM N/A 09/20/2011   Procedure: Earney Mallet;  Surgeon: Conrad Fostoria, MD;  Location[facility] C[facility] LAB;  Service: Cardiovascular;  Laterality: N/A;  . SVT ABLATION N/A 11/26/2016   Procedure: SVT Ablation;  Surgeon: Will Meredith Leeds, MD;  Location: [place] CV LAB;  Service: Cardiovascular;  Laterality: N/A;  . TONSILLECTOMY    . TOTAL KNEE ARTHROPLASTY Right 08/02/2015   Procedure: TOTAL KNEE ARTHROPLASTY;  Surgeon: Renette Butters, MD;  Location: [place];  Service: Orthopedics;  Laterality: Right;  . VENOGRAM N/A 01/26/2013   Procedure: VENOGRAM;  Surgeon: Angelia Mould, MD;  Location: [facility] CATH LAB;  Service: Cardiovascular;  Laterality: N/A;   Family History  Problem Relation Age of Onset  . Heart disease Sister   . Thyroid nodules Sister   . Heart disease Father   . Diabetes Father   . Kidney failure Father   . Hypertension Father   .  Healthy Child   . Healthy Child   . Healthy Child   . Cancer Neg Hx    Social History:  reports that he quit smoking about 11 years ago. His smoking use included Cigarettes. He has a 25.00 pack-year smoking history. He quit smokeless tobacco use about 11 years ago. He reports that he does not drink alcohol or use drugs. Allergies  Allergen Reactions  . Cephalexin Swelling and Other (See Comments)    Tongue swelling  . Statins Other (See Comments)    Weak muscles  . Ciprofloxacin Rash   Prior to Admission medications   Medication Sig Start Date End Date Taking? Authorizing Provider  acetaminophen (TYLENOL) 325 MG tablet Take 650 mg by mouth every 6 (six) hours as needed for mild pain or headache.      [provider]  allopurinol (ZYLOPRIM) 100 MG tablet Take 1 tablet (100 mg total) by mouth daily. 07/20/16   Biagio Borg, MD  citalopram (CELEXA) 10 MG tablet Take 10 mg by mouth daily as needed (anxiety).     [provider]  clopidogrel (PLAVIX) 75 MG tablet TAKE ONE TABLET BY MOUTH ONCE DAILY WITH  BREAKFAST 03/05/17   Bensimhon, Shaune Pascal, MD  colesevelam [facility]) 625 MG tablet TAKE THREE TABLETS BY MOUTH TWICE DAILY WITH MEALS 07/20/16   Biagio Borg, MD  ezetimibe (ZETIA) 10 MG tablet TAKE ONE TABLET BY MOUTH ONCE DAILY 04/08/17   Bensimhon, Shaune Pascal, MD  gabapentin (NEURONTIN) 300 MG capsule 1-2 tabs by mouth at bedtime for sleep and pain 07/20/16   Biagio Borg, MD  HYDROcodone-acetaminophen (NORCO/VICODIN) 5-325 MG tablet Take 1 tablet by mouth every 6 (six) hours as needed for moderate pain.    [provider]  hydroxypropyl methylcellulose / hypromellose (ISOPTO TEARS / GONIOVISC) 2.5 % ophthalmic solution Place 1 drop into both eyes 3 (three) times daily as needed for dry eyes. 05/03/17   Biagio Borg, MD  Lanthanum Carbonate 1000 MG PACK Take 3 packets by mouth 2 (two) times daily.     [provider]  methimazole (TAPAZOLE) 5 MG tablet Take 1 tablet (5 mg total) by mouth daily. 10/24/16   Renato Shin, MD  metoprolol tartrate (LOPRESSOR) 25 MG tablet Take 1 tablet (25 mg total) by mouth 2 (two) times daily. ON NON-HD DAYS 12/26/16   Camnitz, Ocie Doyne, MD  midodrine (PROAMATINE) 10 MG tablet Take 1 tablet (10 mg total) by mouth See admin instructions. Once a day only on dialysis days (Tues/Thurs/Sat) 10/18/16   Regalado, Jerald Kief A, MD  multivitamin (RENA-VIT) TABS tablet Take 1 tablet by mouth at bedtime. Reported on 10/24/2015    [provider]  pantoprazole (PROTONIX) 40 MG tablet Take 40 mg by mouth every evening.  08/31/16   [provider]  SENSIPAR 90 MG tablet Take 90 mg by mouth 2 (two) times daily. 09/18/16   [provider]   Current Facility-Administered Medications  Medication Dose Route Frequency Provider Last Rate Last Dose  . 0.9 %  sodium chloride infusion   Intravenous Once Corey Harold, NP      . chlorhexidine (PERIDEX) 0.12 % solution 15 mL  15 mL Mouth Rinse BID Judeth Porch, MD   15 mL at 05/17/17 0933  . citalopram (CELEXA) tablet 10 mg  10 mg Oral Daily Raylene Miyamoto, MD      . gabapentin (NEURONTIN) capsule 300 mg  300 mg Oral QHS Raylene Miyamoto, MD      .  MEDLINE mouth rinse  15 mL Mouth Rinse q12n4p Judeth Porch, MD      . Derrill Memo ON 05/18/2017] midodrine (PROAMATINE) tablet 10 mg  10 mg Oral Q T,Th,Sat-1800 Raylene Miyamoto, MD      . pantoprazole (PROTONIX) injection 40 mg  40 mg Intravenous Daily Jennelle Human B, NP   40 mg at 05/17/17 0938    ROS: As per HPI otherwise negative.  Physical Exam: Vitals:   05/17/17 0800 05/17/17 0801 05/17/17 0900 05/17/17 1000  BP: (!) 121/46  (!) 122/46 138/63  Pulse: 67  87 75  Resp: 14  (!) 30 15  Temp:  98.6 F (37 C)    TempSrc:  Oral    SpO2: 98%  100% 99%  Weight:      Height:         General: WDWN AAM NAD  Head: NCAT sclera not icteric MMM Neck: Supple. No JVD No masses Lungs: CTA bilaterally without wheezes, rales, or rhonchi. Breathing is unlabored. Heart: RRR with S1 S2,   Abdomen: soft NTND + BS.  No HSM Lower extremities:without edema or ischemic changes, no open wounds  Neuro: A & O  X 3. Moves all extremities spontaneously. Psych:  Responds to questions appropriately with a normal affect. Dialysis Access: RUA AVF +bruit/thrill   Labs: Basic Metabolic Panel:  Recent Labs Lab 05/16/17 2115 05/17/17 0715  NA 143 144  K 4.6 4.9  CL 105 109  CO2 24 23  GLUCOSE 145* 148*  BUN 58* 62*  CREATININE 11.42* 11.59*  CALCIUM 8.8* 8.0*  PHOS  --  5.3*   Liver Function Tests: No results for input(s): AST, ALT, ALKPHOS, BILITOT, PROT, ALBUMIN in the last 168 hours. No results for input(s):  LIPASE, AMYLASE in the last 168 hours. No results for input(s): AMMONIA in the last 168 hours. CBC:  Recent Labs Lab 05/16/17 2115 05/17/17 0715  WBC 7.4  --   HGB 9.1* 7.0*  HCT 29.3* 21.2*  MCV 93.0  --   PLT 328  --    Cardiac Enzymes: No results for input(s): CKTOTAL, CKMB, CKMBINDEX, TROPONINI in the last 168 hours. CBG:  Recent Labs Lab 05/16/17 2208 05/17/17 0556  GLUCAP 126* 143*   Iron Studies: No results for input(s): IRON, TIBC, TRANSFERRIN, FERRITIN in the last 72 hours. Studies/Results: Nm Gi Blood Loss  Result Date: 05/17/2017 CLINICAL DATA:  Recent colonoscopy with removal of polyps. Active GI bleeding. Initial encounter. EXAM: NUCLEAR MEDICINE GASTROINTESTINAL BLEEDING SCAN TECHNIQUE: Sequential abdominal images were obtained following intravenous administration of Tc-72m labeled red blood cells. RADIOPHARMACEUTICALS:  26.2 mCi Tc-84m in-vitro labeled red cells. COMPARISON:  CT of the abdomen and pelvis performed 04/22/2017 FINDINGS: Normal blood pool activity is noted. There is accumulation of activity within the bladder, as expected. No abnormal focal accumulation of activity is seen to suggest active GI bleeding at this time. The study was continued for a total of 2 hours. IMPRESSION: No evidence of active GI bleeding on this study. Electronically Signed   By: Garald Balding M.D.   On: 05/17/2017 05:56    Dialysis Orders:  [facility] TTS  4h 180 F BFR 450/800 2K/2Ca Na Linear Profile 1 EDW 105.5kg R AVF No heparin [name] 6 mcg IV q HD Mircera 75 mcg IV q 4 weeks (dosed 7/31, last OP Hgb 10.0 on 8/9)   Assessment/Plan: 1.  GI Bleed/Hematochezia - No further bleeding episodes this am. Nuclear bleeding scan neg. Per notes GI does not feel  repeat colonoscopy needed. Hgb 9.1>7.0 s/p 1 unit PRBC. Will transfuse another unit with HD today 2.  ESRD -  TTS - missed Thursday d/t cannulation difficulty. Excellent bruit/thrill today. Plan HD today off schedule, use smaller  needles, low BFR  3.  Hypertension/volume  - BP stable, midodrine on OP med list/ Plan UF to EDW  4.  Anemia  - As above. Resume ESA with HD today. Transfuse prn 5.  Metabolic bone disease -  Cont VDRA/Fosrenol binder  6.  Nutrition - on CL, renal diet when resumes eating  7. CAD/ICM/STEMI - holding Plavix with GI bleed   Lynnda Child PA-C [facility] Pager [phone] 05/17/2017, 11:01 AM  I have seen and examined this patient and agree with plan per Larina Earthly.  66yo BM with ESRD admitted last night for acute GI bleed.  Tagged RBC scan neg.  Hg has decreased to 7.  He is hemodynamically stable now.  Did not get HD yest as tech was unable to stick his AVG.  Will plan HD today with transfusion.  Cont to follow Hg.  May need another nuclear study if starts bleeding again. Korri Ask T,MD 05/17/2017 11:48 AM

## 2017-05-17 NOTE — Progress Notes (Signed)
Attempted to receive report, Network engineer stated RN would call back

## 2017-05-17 NOTE — Progress Notes (Signed)
Arrived to patient room 28M-09 at 1434.  Reviewed treatment plan and this RN agrees.  Report received from bedside RN, Colletta Maryland.  Consent obtained.  Patient A & o X 4. Lung sounds diminished and clear to ausculation in all fields. No edema. Cardiac: NSR.  Prepped RUAVF with alcohol and cannulated with two 16 gauge needles.  Pulsation of blood noted.  Flushed access well with saline per protocol.  Connected and secured lines and initiated tx at 1500.  UF goal of 3000 mL and net fluid removal of 2000 mL.  Will continue to monitor.

## 2017-05-17 NOTE — Patient Outreach (Signed)
[facility]) Care Management  05/17/17  [name] [birth year] [id number]  LEVEL OF CARE  Patient was admitted to hospital on 05/17/2017 into medical ICU for rectal bleeding. He experienced a syncopal episode and transient hypotension in the ER, required blood transfusion.   RNCM made an unsuccessful attempt to reach patient on 05/15/2017 for transition of care. Will continue to follow patient and will notify hospital liaison of hospital admission.  [place] R. Leodan Bolyard, RN, BSN, [place] Management Coordinator [phone]

## 2017-05-17 NOTE — H&P (Signed)
PULMONARY / CRITICAL CARE MEDICINE   Name: [name] MRN: [id number] DOB: [birth year]    ADMISSION DATE:  05/16/2017 CONSULTATION DATE:  05/17/17  REFERRING MD:  Dr. Massie Maroon  CHIEF COMPLAINT:  Hematochezia   HISTORY OF PRESENT ILLNESS:   66 year old male with extensive PMH significant for but not limited to ESRD on TTS HD, CAD, NSTEMI s/p PCI on plavix and ASA, HTN, systolic HF (EF 29-93% 7/16), DMT2, HLD, OSA on CPAP who presented to the ER with painless rectal bleeding since 1900.    Additionally, patient has a history of chronic GI bleeding followed by [facility] with recent hospitalization for same on 7/23- 7/28.  Patient underwent colonoscopy 7/27 which showed a small polyp from ascending colon removed, diverticulosis of entire examined colon, petechiae in rectum, questionably radiation proctitis, internal hemorrhoids, and treated with argon plasma coagulation.  Rectal bleeding resolved and patient resumed on plavix alone at discharge.  In ER, patient was cleaned four times for BRBPR with large clots. Patient had syncopal episode and transient hypotension. He was treated with emergency release 1 unit PRBC and platelets, and DDAVP.  Blood pressure stabilized GI and IR were consulted and patient to go for tagged RBC study to help identify the source of bleeding.  Hgb dropped to 9.1 from 10.5 from last admit.  Given symptomatic hematochezia, PCCM to admit for close monitoring.   PAST MEDICAL HISTORY :  He  has a past medical history of Allergic rhinitis, cause unspecified (02/24/2014); Anemia (06/16/2011); BENIGN PROSTATIC HYPERTROPHY (10/14/2009); CAD, NATIVE VESSEL (02/06/2009); Cervical radiculopathy, chronic (02/23/2016); CHEST PAIN (03/29/2010); Chronic combined systolic (congestive) and diastolic (congestive) heart failure ([place]); COLONIC POLYPS, HX OF (10/14/2009); Dementia ([id number]); DEPRESSION (10/14/2009); Depression (09/24/2015); DIABETES MELLITUS, TYPE II (02/01/2010); DIZZINESS (07/17/2010);  DYSLIPIDEMIA (06/18/2007); ESRD (end stage renal disease) on dialysis ([place]) (08/04/2010); FOOT PAIN (08/12/2008); GAIT DISTURBANCE (03/03/2010); GASTROENTERITIS, VIRAL (10/14/2009); GERD (06/18/2007); GOITER, MULTINODULAR (12/26/2007); GOUT (06/18/2007); GYNECOMASTIA (07/17/2010); Hemodialysis access, fistula mature ([place]); Hyperlipidemia (10/16/2011); Hyperparathyroidism, secondary ([place]) (06/16/2011); HYPERTENSION (06/18/2007); Hyperthyroidism; Hypocalcemia (06/07/2010); Ischemic cardiomyopathy; NECK PAIN (07/31/2010); ONYCHOMYCOSIS, TOENAILS (12/26/2007); OSA on CPAP (10/16/2011); Other malaise and fatigue (11/24/2009); PERIPHERAL NEUROPATHY (06/18/2007); Prostate cancer [facility]); PSVT (paroxysmal supraventricular tachycardia) ([place]); PULMONARY NODULE, RIGHT LOWER LOBE (06/08/2009); Sleep apnea; TRANSAMINASES, SERUM, ELEVATED (02/01/2010); Transfusion history; and Unspecified hypotension (01/30/2010).  PAST SURGICAL HISTORY: He  has a past surgical history that includes Coronary angioplasty with stent (06/11/2008); Cervical spine surgery (2/09); Back surgery (1998); Esophagogastroduodenoscopy (09/28/2011); Givens capsule study (09/30/2011); Cholecystectomy; Coronary angioplasty with stent (06/2007); Tonsillectomy; AV fistula placement (11/07/2011); Arteriovenous graft placement (Right, 2009); Insertion of dialysis catheter (Right, 2014); Insertion of dialysis catheter (Left, 02/11/2013); Removal of a dialysis catheter (Right, 02/11/2013); Bascilic vein transposition (Right, 02/27/2013); shuntogram (N/A, 09/20/2011); venogram (N/A, 01/26/2013); Esophagogastroduodenoscopy (N/A, 04/07/2015); Savory dilation (N/A, 04/07/2015); Foreign Body Removal (09/2003); Esophagogastroduodenoscopy (N/A, 04/19/2015); Total knee arthroplasty (Right, 08/02/2015); Cardiac catheterization (N/A, 08/06/2016); Cardiac catheterization (N/A, 08/07/2016); SVT Ablation (N/A, 11/26/2016); and Colonoscopy with propofol (N/A, 04/26/2017).  Allergies  Allergen Reactions  . Cephalexin  Swelling and Other (See Comments)    Tongue swelling  . Statins Other (See Comments)    Weak muscles  . Ciprofloxacin Rash    No current facility-administered medications on file prior to encounter.    Current Outpatient Prescriptions on File Prior to Encounter  Medication Sig  . acetaminophen (TYLENOL) 325 MG tablet Take 650 mg by mouth every 6 (six) hours as needed for mild pain or headache.   . allopurinol (ZYLOPRIM) 100 MG tablet  Take 1 tablet (100 mg total) by mouth daily.  . citalopram (CELEXA) 10 MG tablet Take 10 mg by mouth daily as needed (anxiety).   . clopidogrel (PLAVIX) 75 MG tablet TAKE ONE TABLET BY MOUTH ONCE DAILY WITH  BREAKFAST  . colesevelam (WELCHOL) 625 MG tablet TAKE THREE TABLETS BY MOUTH TWICE DAILY WITH MEALS  . ezetimibe (ZETIA) 10 MG tablet TAKE ONE TABLET BY MOUTH ONCE DAILY  . gabapentin (NEURONTIN) 300 MG capsule 1-2 tabs by mouth at bedtime for sleep and pain  . HYDROcodone-acetaminophen (NORCO/VICODIN) 5-325 MG tablet Take 1 tablet by mouth every 6 (six) hours as needed for moderate pain.  . hydroxypropyl methylcellulose / hypromellose (ISOPTO TEARS / GONIOVISC) 2.5 % ophthalmic solution Place 1 drop into both eyes 3 (three) times daily as needed for dry eyes.  . Lanthanum Carbonate 1000 MG PACK Take 3 packets by mouth 2 (two) times daily.   . methimazole (TAPAZOLE) 5 MG tablet Take 1 tablet (5 mg total) by mouth daily.  . metoprolol tartrate (LOPRESSOR) 25 MG tablet Take 1 tablet (25 mg total) by mouth 2 (two) times daily. ON NON-HD DAYS  . midodrine (PROAMATINE) 10 MG tablet Take 1 tablet (10 mg total) by mouth See admin instructions. Once a day only on dialysis days (Tues/Thurs/Sat)  . multivitamin (RENA-VIT) TABS tablet Take 1 tablet by mouth at bedtime. Reported on 10/24/2015  . pantoprazole (PROTONIX) 40 MG tablet Take 40 mg by mouth every evening.   . SENSIPAR 90 MG tablet Take 90 mg by mouth 2 (two) times daily.    FAMILY HISTORY:  His indicated  that his mother is alive. He indicated that his father is alive. He indicated that his sister is alive. He indicated that his maternal grandmother is deceased. He indicated that his maternal grandfather is deceased. He indicated that his paternal grandmother is deceased. He indicated that his paternal grandfather is deceased. He indicated that all of his three childs are alive. He indicated that the status of his neg hx is unknown.   SOCIAL HISTORY: He  reports that he quit smoking about 11 years ago. His smoking use included Cigarettes. He has a 25.00 pack-year smoking history. He quit smokeless tobacco use about 11 years ago. He reports that he does not drink alcohol or use drugs.  REVIEW OF SYSTEMS:  POSITIVES IN BOLD Gen: Denies fever, chills, weight change, fatigue, night sweats HEENT: Denies blurred vision, double vision, hearing loss, tinnitus, sinus congestion, rhinorrhea, sore throat, neck stiffness, dysphagia PULM: Denies shortness of breath, cough, sputum production, hemoptysis, wheezing CV: Denies syncope, chest pain, edema, orthopnea, paroxysmal nocturnal dyspnea, palpitations GI: Denies abdominal pain, nausea, vomiting, diarrhea, hematochezia, melena, constipation, change in bowel habits GU: Denies dysuria, hematuria, polyuria, oliguria, urethral discharge Endocrine: Denies hot or cold intolerance, polyuria, polyphagia or appetite change Derm: Denies rash, dry skin, scaling or peeling skin change Heme: Denies easy bruising, bleeding, bleeding gums Neuro: Denies headache, numbness, weakness, slurred speech, loss of memory or consciousness  SUBJECTIVE:  Denies abd or rectal pain, N/V, SOB, CP   VITAL SIGNS: BP (!) 116/50   Pulse 71   Temp 98.3 F (36.8 C) (Oral)   Resp 12   Ht 6\' 1"  (1.854 m)   Wt 233 lb (105.7 kg)   SpO2 99%   BMI 30.74 kg/m   HEMODYNAMICS:   VENTILATOR SETTINGS:   INTAKE / OUTPUT: No intake/output data recorded.  PHYSICAL EXAMINATION: General:   Adult male lying on ER stretcher in NAD  HEENT: MM pink/moist, Pupils 4/=, sclerae anicteric  Neuro: AOx 3, MAE, nonfocal CV: rrr, no murmur PULM: even/non-labored, lungs bilaterally clear, on room air GI: soft, nd/nt, bs hyperactive  Extremities: warm/dry, no edema, AVG RUE +B/t Skin: no rashes   LABS:  BMET  Recent Labs Lab 05/16/17 2115  NA 143  K 4.6  CL 105  CO2 24  BUN 58*  CREATININE 11.42*  GLUCOSE 145*    Electrolytes  Recent Labs Lab 05/16/17 2115  CALCIUM 8.8*    CBC  Recent Labs Lab 05/16/17 2115  WBC 7.4  HGB 9.1*  HCT 29.3*  PLT 328    Coag's No results for input(s): APTT, INR in the last 168 hours.  Sepsis Markers  Recent Labs Lab 05/16/17 2130  LATICACIDVEN 1.54    ABG No results for input(s): PHART, PCO2ART, PO2ART in the last 168 hours.  Liver Enzymes No results for input(s): AST, ALT, ALKPHOS, BILITOT, ALBUMIN in the last 168 hours.  Cardiac Enzymes No results for input(s): TROPONINI, PROBNP in the last 168 hours.  Glucose  Recent Labs Lab 05/16/17 2208  GLUCAP 126*    Imaging No results found.  STUDIES:  04/26/2017 >>Small polyp from ascending colon removed, diverticulosis of entire examined colon, petechiae in rectum (? Radiation proctitis)-treated with argon plasma coagulation &noted internal hemorrhoids 8/17 Tagged RBC study >>neg  CULTURES: MRSA PCR 8/17 >>  ANTIBIOTICS: none  SIGNIFICANT EVENTS: 8/17 Admit  LINES/TUBES: 18g x 1 22g x 1  DISCUSSION: 42 yoM w/extensive PMH recently discharged on 7/27 with chronic rectal bleeding presented with acute onset of painless hematochezia with syncopal episode in ER with transient hypotension, Hgb 9.1.  ASSESSMENT / PLAN:  PULMONARY A: OSA on CPAP - 98% on room air, no SOB P:   Supplemental O2 as needed for sats > 92% CPAP q HS Pulmonary hygiene   CARDIOVASCULAR A:  Syncope/hypotension- r/t to ABLA- resolved  Hx HTN, systolic HF, CAD, NSTEMI s/p  PCI on plavix, HLD,  -currently normotensive and asymptomatic P:  Tele monitoring  Goal MAP >65 Gentle volume resuscitation given systolic HF/ HD w/missed treatment 8/17 Hold home zetia, plavix, lopressor   RENAL A:   ESRD on TTS HD - missed HD on 8/16 due to inability to access AVG P:   Consult Renal in am and possibly VS to evaluate AVG Trend BMET/ Mag/ phos Holding home sensipar, multivitamin, lanthanum carbonate, and midodrine (day of HD)  GASTROINTESTINAL A:   Acute on chronic rectal bleeding- acute diverticular bleed vs radiation proctitis GERD P:   NPO for now  H/H q 4hr  Protonix IV for SUP Appreciate GI and IR assistance Patient to go for tagged RBC study to help determine location of bleeding  Resume welchol when able to take PO  HEMATOLOGIC A:   ABLA and chronic Anemia  - previous admit 8.6-9.6 P:  Trend H/H q 4  S/p 1 unit PRBC and PLTs S/p DDAVP Transfuse for Hgb <8 with cardiac disease  INFECTIOUS A:   No acute process P:   Monitor fever curve/ trend WBC  ENDOCRINE A:   DMT2 Hyperthyroid  P:   CBG q 4 SSI if glucose > 150 Resume methimazole when able to take PO  NEUROLOGIC A:   Hx Depression, Chronic pain  P:   Hold preadmit neurontin, vicodin, celexa    FAMILY  - Updates: Patient and wife at bedside updated.   - Inter-disciplinary family meet or Palliative Care meeting due by:  05/24/17  CCT 45 mins  [name], ACNP Pulmonary and [place] Pager: [phone]  05/17/2017, 12:16 AM  STAFF NOTE: I, Merrie Roof, MD FACP have personally reviewed patient's available data, including medical history, events of note, physical examination and test results as part of my evaluation. I have discussed with resident/NP and other care providers such as pharmacist, RN and RRT. In addition, I personally evaluated patient and elicited key findings of: awake, alert, no distress, jvd low, lungs clear,  abdo soft, no bleeding at present, no edema, TAg RBC scan I reviewed does not show source or bleeding, his crt is elevated and missed hd yesterday, will consider HD today and room for more products if needed and avoid any plat dyspfxn with uremia, last cbc noted, HGB 7 at 7 am with high risk repeat bleeding, for 1 unit prbc, cbc q8h, continued to hold plavix, if rebleeds, d/w GI = tag rbc repeat then IR if positive, diet per GI, remain in icu with observation, repeat ddavp not a good option with tachyphylaxis risk, tele, follow pulse pressure, pcxr in am for volume status, only resus with prbc, no additional fluids, I updated pt in full The patient is critically ill with multiple organ systems failure and requires high complexity decision making for assessment and support, frequent evaluation and titration of therapies, application of advanced monitoring technologies and extensive interpretation of multiple databases.   Critical Care Time devoted to patient care services described in this note is 30 Minutes. This time reflects time of care of this signee: Merrie Roof, MD FACP. This critical care time does not reflect procedure time, or teaching time or supervisory time of PA/NP/Med student/Med Resident etc but could involve care discussion time. Rest per NP/medical resident whose note is outlined above and that I agree with   Lavon Paganini. Titus Mould, MD, [place] Pgr: [facility] 05/17/2017 10:19 AM

## 2017-05-17 NOTE — Progress Notes (Signed)
Dialysis treatment completed.  3000 mL ultrafiltrated and net fluid removal 2000 mL.    Patient status unchanged. Lung sounds diminished to ausculation in all fields. No edema. Cardiac: NSR.  Disconnected lines and removed needles.  Pressure held for 10 minutes and band aid/gauze dressing applied.  Report given to bedside RN, Colletta [place].  1 unit PRBC administered intra treatment.

## 2017-05-17 NOTE — ED Notes (Signed)
Report attempted 

## 2017-05-17 NOTE — Progress Notes (Signed)
CRITICAL VALUE ALERT  Critical Value:  Hemoglobin 6.4  Date & Time Notied:  05/17/17  Provider Notified: Georgann Housekeeper, NP  Orders Received/Actions taken: Yes, Hemodialysis with blood to be given asap

## 2017-05-17 NOTE — ED Notes (Signed)
Patient in ED for evaluation of recurrent rectal bleeding.  Patient report having recent coloscopy with polyp removal.  Patient reported having bright red stools over the last day.  In the ED has had 3 large red stools with clots observed.  Patient started on blood and platelet transfusions.  Medical hx of CKD and on dialysis Tue, Thur, Sat.  Last dialysis on  05/14/17.  Patient has syncopal episode in ED, patient is now A&Ox4.  Wife at the bedside.  Awaiting bed placement.

## 2017-05-17 NOTE — ED Notes (Signed)
Critical care MD at the bedside.

## 2017-05-18 ENCOUNTER — Inpatient Hospital Stay (HOSPITAL_COMMUNITY): Payer: Medicare Other

## 2017-05-18 DIAGNOSIS — K921 Melena: Secondary | ICD-10-CM

## 2017-05-18 LAB — CBC
HCT: 22.7 % — ABNORMAL LOW (ref 39.0–52.0)
HEMOGLOBIN: 7.3 g/dL — AB (ref 13.0–17.0)
MCH: 28.9 pg (ref 26.0–34.0)
MCHC: 32.2 g/dL (ref 30.0–36.0)
MCV: 89.7 fL (ref 78.0–100.0)
PLATELETS: 196 10*3/uL (ref 150–400)
RBC: 2.53 MIL/uL — AB (ref 4.22–5.81)
RDW: 17.5 % — ABNORMAL HIGH (ref 11.5–15.5)
WBC: 7.3 10*3/uL (ref 4.0–10.5)

## 2017-05-18 LAB — BPAM PLATELET PHERESIS
BLOOD PRODUCT EXPIRATION DATE: 201808182359
BLOOD PRODUCT EXPIRATION DATE: 201808182359
ISSUE DATE / TIME: 201808162330
ISSUE DATE / TIME: 201808170101
UNIT TYPE AND RH: 5100
UNIT TYPE AND RH: 5100

## 2017-05-18 LAB — PREPARE PLATELET PHERESIS
Unit division: 0
Unit division: 0

## 2017-05-18 LAB — GLUCOSE, CAPILLARY
GLUCOSE-CAPILLARY: 104 mg/dL — AB (ref 65–99)
Glucose-Capillary: 92 mg/dL (ref 65–99)
Glucose-Capillary: 89 mg/dL (ref 65–99)
Glucose-Capillary: 125 mg/dL — ABNORMAL HIGH (ref 65–99)

## 2017-05-18 LAB — RENAL FUNCTION PANEL
ANION GAP: 12 (ref 5–15)
Albumin: 2.9 g/dL — ABNORMAL LOW (ref 3.5–5.0)
BUN: 29 mg/dL — ABNORMAL HIGH (ref 6–20)
CHLORIDE: 101 mmol/L (ref 101–111)
CO2: 26 mmol/L (ref 22–32)
Calcium: 7.5 mg/dL — ABNORMAL LOW (ref 8.9–10.3)
Creatinine, Ser: 7.39 mg/dL — ABNORMAL HIGH (ref 0.61–1.24)
GFR, EST AFRICAN AMERICAN: 8 mL/min — AB (ref >60)
GFR, EST NON AFRICAN AMERICAN: 7 mL/min — AB (ref >60)
Glucose, Bld: 92 mg/dL (ref 65–99)
PHOSPHORUS: 3.9 mg/dL (ref 2.5–4.6)
Potassium: 4 mmol/L (ref 3.5–5.1)
SODIUM: 139 mmol/L (ref 135–145)

## 2017-05-18 NOTE — Progress Notes (Signed)
S: No further overt blood loss O:BP (!) 152/57   Pulse 69   Temp (!) 97.5 F (36.4 C)   Resp 17   Ht 6\' 1"  (1.854 m)   Wt 102 kg (224 lb 13.9 oz)   SpO2 97%   BMI 29.67 kg/m   Intake/Output Summary (Last 24 hours) at 05/18/17 0718 Last data filed at 05/17/17 1903  Gross per 24 hour  Intake              415 ml  Output             2000 ml  Net            -1585 ml   Weight change: 1.712 kg (3 lb 12.4 oz) EHU:DJSHF and alert CVS: RRR Resp:Clear Abd:+ BS NTND Ext: No edema RUA AVF + bruit NEURO:CNI Ox3 No asterixis   . chlorhexidine  15 mL Mouth Rinse BID  . citalopram  10 mg Oral Daily  . darbepoetin (ARANESP) injection - DIALYSIS  200 mcg Intravenous Q Fri-HD  . doxercalciferol  6 mcg Intravenous Q T,Th,Sa-HD  . gabapentin  300 mg Oral QHS  . lanthanum  1,000 mg Oral TID WC  . mouth rinse  15 mL Mouth Rinse q12n4p  . midodrine  10 mg Oral Q T,Th,Sat-1800  . multivitamin  1 tablet Oral QHS  . pantoprazole (PROTONIX) IV  40 mg Intravenous Daily   Nm Gi Blood Loss  Result Date: 05/17/2017 CLINICAL DATA:  Recent colonoscopy with removal of polyps. Active GI bleeding. Initial encounter. EXAM: NUCLEAR MEDICINE GASTROINTESTINAL BLEEDING SCAN TECHNIQUE: Sequential abdominal images were obtained following intravenous administration of Tc-48m labeled red blood cells. RADIOPHARMACEUTICALS:  26.2 mCi Tc-59m in-vitro labeled red cells. COMPARISON:  CT of the abdomen and pelvis performed 04/22/2017 FINDINGS: Normal blood pool activity is noted. There is accumulation of activity within the bladder, as expected. No abnormal focal accumulation of activity is seen to suggest active GI bleeding at this time. The study was continued for a total of 2 hours. IMPRESSION: No evidence of active GI bleeding on this study. Electronically Signed   By: Garald Balding M.D.   On: 05/17/2017 05:56   BMET    Component Value Date/Time   NA 139 05/18/2017 0213   NA 143 11/20/2016 0842   K 4.0 05/18/2017  0213   CL 101 05/18/2017 0213   CO2 26 05/18/2017 0213   GLUCOSE 92 05/18/2017 0213   BUN 29 (H) 05/18/2017 0213   BUN 59 (H) 11/20/2016 0842   CREATININE 7.39 (H) 05/18/2017 0213   CALCIUM 7.5 (L) 05/18/2017 0213   GFRNONAA 7 (L) 05/18/2017 0213   GFRAA 8 (L) 05/18/2017 0213   CBC    Component Value Date/Time   WBC 7.3 05/18/2017 0213   RBC 2.53 (L) 05/18/2017 0213   HGB 7.3 (L) 05/18/2017 0213   HGB 11.7 (L) 11/20/2016 0842   HCT 22.7 (L) 05/18/2017 0213   HCT 35.6 (L) 11/20/2016 0842   PLT 196 05/18/2017 0213   PLT 304 11/20/2016 0842   MCV 89.7 05/18/2017 0213   MCV 93 11/20/2016 0842   MCH 28.9 05/18/2017 0213   MCHC 32.2 05/18/2017 0213   RDW 17.5 (H) 05/18/2017 0213   RDW 18.2 (H) 11/20/2016 0842   LYMPHSABS 1.7 05/03/2017 1612   MONOABS 1.1 (H) 05/03/2017 1612   EOSABS 0.2 05/03/2017 1612   BASOSABS 0.1 05/03/2017 1612     Assessment:  1. Lower GI bleed 2. Anemia sec  ESRD and now GI bleed 3. Sec HPTH 4. CAD 5. ESRD TTS AF   Plan: 1, HD today to get back on TTS schedule 2. Cont to follow H/H 3. Transfuse 1 unit on HD   Leanthony Rhett T

## 2017-05-18 NOTE — Progress Notes (Signed)
Subjective: Seen and examined at bedside. He reports having bowel movements yesterday evening and today morning, without blood in it. The stools were hard and formed, and also normal color as the patient. Denies any nausea, vomiting or abdominal pain, states he is hungry and wants to eat.  Objective: Vital signs in last 24 hours: Temp:  [97.5 F (36.4 C)-99.9 F (37.7 C)] 97.8 F (36.6 C) (08/18 0820) Pulse Rate:  [66-92] 80 (08/18 0900) Resp:  [10-28] 17 (08/18 0800) BP: (123-173)/(45-123) 152/53 (08/18 0800) SpO2:  [93 %-100 %] 97 % (08/18 0900) Weight:  [102 kg (224 lb 13.9 oz)-107.4 kg (236 lb 12.4 oz)] 102 kg (224 lb 13.9 oz) (08/18 0500) Weight change: 1.712 kg (3 lb 12.4 oz) Last BM Date: 05/17/17  PE: Sitting on chair at the bedside, not in acute distress GENERAL: Overweight, mild pallor, no icterus ABDOMEN: Soft, nondistended, nontender, normoactive bowel sounds EXTREMITIES: No edema  Lab Results: Results for orders placed or performed during the hospital encounter of 05/16/17 (from the past 48 hour(s))  Basic metabolic panel     Status: Abnormal   Collection Time: 05/16/17  9:15 PM  Result Value Ref Range   Sodium 143 135 - 145 mmol/L   Potassium 4.6 3.5 - 5.1 mmol/L   Chloride 105 101 - 111 mmol/L   CO2 24 22 - 32 mmol/L   Glucose, Bld 145 (H) 65 - 99 mg/dL   BUN 58 (H) 6 - 20 mg/dL   Creatinine, Ser 11.42 (H) 0.61 - 1.24 mg/dL   Calcium 8.8 (L) 8.9 - 10.3 mg/dL   GFR calc non Af Amer 4 (L) >60 mL/min   GFR calc Af Amer 5 (L) >60 mL/min    Comment: (NOTE) The eGFR has been calculated using the CKD EPI equation. This calculation has not been validated in all clinical situations. eGFR's persistently <60 mL/min signify possible Chronic Kidney Disease.    Anion gap 14 5 - 15  CBC     Status: Abnormal   Collection Time: 05/16/17  9:15 PM  Result Value Ref Range   WBC 7.4 4.0 - 10.5 K/uL   RBC 3.15 (L) 4.22 - 5.81 MIL/uL   Hemoglobin 9.1 (L) 13.0 - 17.0 g/dL    HCT 29.3 (L) 39.0 - 52.0 %   MCV 93.0 78.0 - 100.0 fL   MCH 28.9 26.0 - 34.0 pg   MCHC 31.1 30.0 - 36.0 g/dL   RDW 18.5 (H) 11.5 - 15.5 %   Platelets 328 150 - 400 K/uL  Type and screen [place]     Status: None   Collection Time: 05/16/17  9:15 PM  Result Value Ref Range   ABO/RH(D) O POS    Antibody Screen NEG    Sample Expiration 05/19/2017    Unit Number [id number]    Blood Component Type RED CELLS,LR    Unit division 00    Status of Unit ISSUED,FINAL    Transfusion Status OK TO TRANSFUSE    Crossmatch Result Compatible    Unit Number [id number]    Blood Component Type RED CELLS,LR    Unit division 00    Status of Unit ISSUED,FINAL    Transfusion Status OK TO TRANSFUSE    Crossmatch Result Compatible   I-Stat CG4 Lactic Acid, ED     Status: None   Collection Time: 05/16/17  9:30 PM  Result Value Ref Range   Lactic Acid, Venous 1.54 0.5 - 1.9 mmol/L  CBG monitoring, ED  Status: Abnormal   Collection Time: 05/16/17 10:08 PM  Result Value Ref Range   Glucose-Capillary 126 (H) 65 - 99 mg/dL  Prepare RBC     Status: None   Collection Time: 05/16/17 10:53 PM  Result Value Ref Range   Order Confirmation ORDER PROCESSED BY BLOOD BANK   Prepare Pheresed Platelets     Status: None   Collection Time: 05/16/17 11:30 PM  Result Value Ref Range   Unit Number [id number]    Blood Component Type PLTPHER LR2    Unit division 00    Status of Unit ISSUED,FINAL    Transfusion Status OK TO TRANSFUSE    Unit Number [id number]    Blood Component Type PLTPHER LR1    Unit division 00    Status of Unit ISSUED,FINAL    Transfusion Status OK TO TRANSFUSE   MRSA PCR Screening     Status: None   Collection Time: 05/17/17  1:51 AM  Result Value Ref Range   MRSA by PCR NEGATIVE NEGATIVE    Comment:        The GeneXpert MRSA Assay (FDA approved for NASAL specimens only), is one component of a comprehensive MRSA colonization surveillance program. It is  not intended to diagnose MRSA infection nor to guide or monitor treatment for MRSA infections.   Glucose, capillary     Status: Abnormal   Collection Time: 05/17/17  5:56 AM  Result Value Ref Range   Glucose-Capillary 143 (H) 65 - 99 mg/dL   Comment 1 Document in Chart   Basic metabolic panel     Status: Abnormal   Collection Time: 05/17/17  7:15 AM  Result Value Ref Range   Sodium 144 135 - 145 mmol/L   Potassium 4.9 3.5 - 5.1 mmol/L   Chloride 109 101 - 111 mmol/L   CO2 23 22 - 32 mmol/L   Glucose, Bld 148 (H) 65 - 99 mg/dL   BUN 62 (H) 6 - 20 mg/dL   Creatinine, Ser 11.59 (H) 0.61 - 1.24 mg/dL   Calcium 8.0 (L) 8.9 - 10.3 mg/dL   GFR calc non Af Amer 4 (L) >60 mL/min   GFR calc Af Amer 5 (L) >60 mL/min    Comment: (NOTE) The eGFR has been calculated using the CKD EPI equation. This calculation has not been validated in all clinical situations. eGFR's persistently <60 mL/min signify possible Chronic Kidney Disease.    Anion gap 12 5 - 15  Magnesium     Status: None   Collection Time: 05/17/17  7:15 AM  Result Value Ref Range   Magnesium 2.1 1.7 - 2.4 mg/dL  Phosphorus     Status: Abnormal   Collection Time: 05/17/17  7:15 AM  Result Value Ref Range   Phosphorus 5.3 (H) 2.5 - 4.6 mg/dL  Hemoglobin and hematocrit, blood     Status: Abnormal   Collection Time: 05/17/17  7:15 AM  Result Value Ref Range   Hemoglobin 7.0 (L) 13.0 - 17.0 g/dL    Comment: REPEATED TO VERIFY DELTA CHECK NOTED    HCT 21.2 (L) 39.0 - 52.0 %  Prepare RBC     Status: None   Collection Time: 05/17/17 10:27 AM  Result Value Ref Range   Order Confirmation ORDER PROCESSED BY BLOOD BANK   Glucose, capillary     Status: Abnormal   Collection Time: 05/17/17 11:36 AM  Result Value Ref Range   Glucose-Capillary 102 (H) 65 - 99 mg/dL   Comment 1 Capillary  Specimen    Comment 2 Notify RN   Hemoglobin and hematocrit, blood     Status: Abnormal   Collection Time: 05/17/17 12:14 PM  Result Value Ref  Range   Hemoglobin 6.4 (LL) 13.0 - 17.0 g/dL    Comment: REPEATED TO VERIFY CRITICAL RESULT CALLED TO, READ BACK BY AND VERIFIED WITH: S.TUTTLE RN 1240 05/17/17 HDAY    HCT 19.5 (L) 39.0 - 52.0 %  Glucose, capillary     Status: Abnormal   Collection Time: 05/17/17  3:34 PM  Result Value Ref Range   Glucose-Capillary 151 (H) 65 - 99 mg/dL   Comment 1 Capillary Specimen    Comment 2 Notify RN   CBC     Status: Abnormal   Collection Time: 05/17/17  8:04 PM  Result Value Ref Range   WBC 8.4 4.0 - 10.5 K/uL   RBC 2.68 (L) 4.22 - 5.81 MIL/uL   Hemoglobin 7.8 (L) 13.0 - 17.0 g/dL   HCT 24.4 (L) 39.0 - 52.0 %   MCV 91.0 78.0 - 100.0 fL   MCH 29.1 26.0 - 34.0 pg   MCHC 32.0 30.0 - 36.0 g/dL   RDW 17.6 (H) 11.5 - 15.5 %   Platelets 213 150 - 400 K/uL  Glucose, capillary     Status: Abnormal   Collection Time: 05/17/17  8:48 PM  Result Value Ref Range   Glucose-Capillary 156 (H) 65 - 99 mg/dL   Comment 1 Notify RN   CBC     Status: Abnormal   Collection Time: 05/18/17  2:13 AM  Result Value Ref Range   WBC 7.3 4.0 - 10.5 K/uL   RBC 2.53 (L) 4.22 - 5.81 MIL/uL   Hemoglobin 7.3 (L) 13.0 - 17.0 g/dL   HCT 22.7 (L) 39.0 - 52.0 %   MCV 89.7 78.0 - 100.0 fL   MCH 28.9 26.0 - 34.0 pg   MCHC 32.2 30.0 - 36.0 g/dL   RDW 17.5 (H) 11.5 - 15.5 %   Platelets 196 150 - 400 K/uL  Renal function panel     Status: Abnormal   Collection Time: 05/18/17  2:13 AM  Result Value Ref Range   Sodium 139 135 - 145 mmol/L   Potassium 4.0 3.5 - 5.1 mmol/L    Comment: DELTA CHECK NOTED   Chloride 101 101 - 111 mmol/L   CO2 26 22 - 32 mmol/L   Glucose, Bld 92 65 - 99 mg/dL   BUN 29 (H) 6 - 20 mg/dL   Creatinine, Ser 7.39 (H) 0.61 - 1.24 mg/dL    Comment: DELTA CHECK NOTED   Calcium 7.5 (L) 8.9 - 10.3 mg/dL   Phosphorus 3.9 2.5 - 4.6 mg/dL   Albumin 2.9 (L) 3.5 - 5.0 g/dL   GFR calc non Af Amer 7 (L) >60 mL/min   GFR calc Af Amer 8 (L) >60 mL/min    Comment: (NOTE) The eGFR has been calculated using  the CKD EPI equation. This calculation has not been validated in all clinical situations. eGFR's persistently <60 mL/min signify possible Chronic Kidney Disease.    Anion gap 12 5 - 15  Glucose, capillary     Status: None   Collection Time: 05/18/17  8:31 AM  Result Value Ref Range   Glucose-Capillary 92 65 - 99 mg/dL   Comment 1 Capillary Specimen    Comment 2 Notify RN    *Note: Due to a large number of results and/or encounters for the requested time period,  some results have not been displayed. A complete set of results can be found in Results Review.    Studies/Results: Nm Gi Blood Loss  Result Date: 05/17/2017 CLINICAL DATA:  Recent colonoscopy with removal of polyps. Active GI bleeding. Initial encounter. EXAM: NUCLEAR MEDICINE GASTROINTESTINAL BLEEDING SCAN TECHNIQUE: Sequential abdominal images were obtained following intravenous administration of Tc-46mlabeled red blood cells. RADIOPHARMACEUTICALS:  26.2 mCi Tc-922mn-vitro labeled red cells. COMPARISON:  CT of the abdomen and pelvis performed 04/22/2017 FINDINGS: Normal blood pool activity is noted. There is accumulation of activity within the bladder, as expected. No abnormal focal accumulation of activity is seen to suggest active GI bleeding at this time. The study was continued for a total of 2 hours. IMPRESSION: No evidence of active GI bleeding on this study. Electronically Signed   By: JeGarald Balding.D.   On: 05/17/2017 05:56   Dg Chest Port 1 View  Result Date: 05/18/2017 CLINICAL DATA:  Pulmonary edemaHx of CP, CAD, DM2, ESRD, PSVT, pulmonary nodule (RRL) EXAM: PORTABLE CHEST 1 VIEW COMPARISON:  04/22/2017 FINDINGS: The cardiac silhouette is normal in size. No mediastinal or hilar masses. No evidence of adenopathy. Mild linear scarring or chronic atelectasis in the left mid lung. Lungs are otherwise clear. No pleural effusion. No pneumothorax. Right arm vascular stent is stable. There stable changes from a previous  cervical spine fusion. IMPRESSION: No acute cardiopulmonary disease. Electronically Signed   By: DaLajean Manes.D.   On: 05/18/2017 07:38    Medications: I have reviewed the patient's current medications.  Assessment: 1. Painless hematochezia, likely diverticular in origin, seems to have resolved. 2. Radiation proctitis, status post APC performed on 04/26/2017, Plavix on hold since yesterday. 3. End-stage renal disease on hemodialysis(Tuesday/Thursday/Saturday)  Plan: 1. Hemodynamically stable, hemoglobin post-transfusion, yesterday was 7.8 and today is 7.3(2 units PRBC and  2 units platelet on 05/17/17). Bleeding scan showed no evidence of active bleeding. To be started on clear liquid diet, okay to advance to renal diet by today evening if no further bleeding noted. Monitor H&H and transfuse as needed.  2. Radiation proctitis, Plavix on hold since yesterday, will benefit from RFA therapy(ideally done with Plavix on hold for 5 days, inpatient if he is here until Tuesday, or to be arranged as an outpatient). Bleeding less likely from radiation proctitis.  3. Recommend stat bleeding scan if there is any evidence of further bleeding, for possible embolization.  ArRonnette Juniper/18/2018, 11:51 AM   Pager [phone] no answer or after 5 PM call [phone]

## 2017-05-18 NOTE — H&P (Signed)
PULMONARY / CRITICAL CARE MEDICINE   Name: [name] MRN: [id number] DOB: 1951/07/08    ADMISSION DATE:  05/16/2017 CONSULTATION DATE:  05/17/17  REFERRING MD:  Dr. Massie Maroon  CHIEF COMPLAINT:  Hematochezia   HISTORY OF PRESENT ILLNESS:   66 year old male with extensive PMH significant for but not limited to ESRD on TTS HD, CAD, NSTEMI s/p PCI on plavix and ASA, HTN, systolic HF (EF 86-76% 7/20), DMT2, HLD, OSA on CPAP who presented to the ER with painless rectal bleeding since 1900.    Additionally, patient has a history of chronic GI bleeding followed by [facility] with recent hospitalization for same on 7/23- 7/28.  Patient underwent colonoscopy 7/27 which showed a small polyp from ascending colon removed, diverticulosis of entire examined colon, petechiae in rectum, questionably radiation proctitis, internal hemorrhoids, and treated with argon plasma coagulation.  Rectal bleeding resolved and patient resumed on plavix alone at discharge.  In ER, patient was cleaned four times for BRBPR with large clots. Patient had syncopal episode and transient hypotension. He was treated with emergency release 1 unit PRBC and platelets, and DDAVP.  Blood pressure stabilized GI and IR were consulted and patient to go for tagged RBC study to help identify the source of bleeding.  Hgb dropped to 9.1 from 10.5 from last admit.  Given symptomatic hematochezia, PCCM to admit for close monitoring.   SUBJECTIVE:  No bleeding Got hd BP wnl No stool Last stool NON bloody  VITAL SIGNS: BP (!) 152/53 (BP Location: Left Arm)   Pulse 80   Temp 97.8 F (36.6 C) (Oral)   Resp 17   Ht 6' 1"  (1.854 m)   Wt 102 kg (224 lb 13.9 oz)   SpO2 97%   BMI 29.67 kg/m   HEMODYNAMICS:   VENTILATOR SETTINGS:   INTAKE / OUTPUT: I/O last 3 completed shifts: In: 2673.5 [I.V.:670; Blood:2003.5] Out: 2000 [Other:2000]  PHYSICAL EXAMINATION: General: awake, alert Neuro: oriented, no focal HEENT: jvd wnl PULM: CTA  slight didtant CV: s1 s2 RRR not tachy at rest GI: soft, BS wnl, no r Extremities: fistula good thril rue  LABS:  BMET  Recent Labs Lab 05/16/17 2115 05/17/17 0715 05/18/17 0213  NA 143 144 139  K 4.6 4.9 4.0  CL 105 109 101  CO2 24 23 26   BUN 58* 62* 29*  CREATININE 11.42* 11.59* 7.39*  GLUCOSE 145* 148* 92    Electrolytes  Recent Labs Lab 05/16/17 2115 05/17/17 0715 05/18/17 0213  CALCIUM 8.8* 8.0* 7.5*  MG  --  2.1  --   PHOS  --  5.3* 3.9    CBC  Recent Labs Lab 05/16/17 2115  05/17/17 1214 05/17/17 2004 05/18/17 0213  WBC 7.4  --   --  8.4 7.3  HGB 9.1*  < > 6.4* 7.8* 7.3*  HCT 29.3*  < > 19.5* 24.4* 22.7*  PLT 328  --   --  213 196  < > = values in this interval not displayed.  Coag's No results for input(s): APTT, INR in the last 168 hours.  Sepsis Markers  Recent Labs Lab 05/16/17 2130  LATICACIDVEN 1.54    ABG No results for input(s): PHART, PCO2ART, PO2ART in the last 168 hours.  Liver Enzymes  Recent Labs Lab 05/18/17 0213  ALBUMIN 2.9*    Cardiac Enzymes No results for input(s): TROPONINI, PROBNP in the last 168 hours.  Glucose  Recent Labs Lab 05/16/17 2208 05/17/17 0556 05/17/17 1136 05/17/17 1534  05/17/17 2048 05/18/17 0831  GLUCAP 126* 143* 102* 151* 156* 92    Imaging Dg Chest Port 1 View  Result Date: 05/18/2017 CLINICAL DATA:  Pulmonary edemaHx of CP, CAD, DM2, ESRD, PSVT, pulmonary nodule (RRL) EXAM: PORTABLE CHEST 1 VIEW COMPARISON:  04/22/2017 FINDINGS: The cardiac silhouette is normal in size. No mediastinal or hilar masses. No evidence of adenopathy. Mild linear scarring or chronic atelectasis in the left mid lung. Lungs are otherwise clear. No pleural effusion. No pneumothorax. Right arm vascular stent is stable. There stable changes from a previous cervical spine fusion. IMPRESSION: No acute cardiopulmonary disease. Electronically Signed   By: Lajean Manes M.D.   On: 05/18/2017 07:38    STUDIES:   04/26/2017 >>Small polyp from ascending colon removed, diverticulosis of entire examined colon, petechiae in rectum (? Radiation proctitis)-treated with argon plasma coagulation &noted internal hemorrhoids 8/17 Tagged RBC study >>neg  CULTURES:  ANTIBIOTICS: none  SIGNIFICANT EVENTS: 8/17 Admit 8/17 neg tag 8/18- no bleeding  LINES/TUBES: 18g x 1 22g x 1  DISCUSSION: 62 yoM w/extensive PMH recently discharged on 7/27 with chronic rectal bleeding presented with acute onset of painless hematochezia with syncopal episode in ER with transient hypotension, Hgb 9.1.  ASSESSMENT / PLAN:  PULMONARY A: OSA on CPAP - 98% on room air, no SOB P:   cpap at night Some edema on am pcxr fluid in fisuure, for hd today  agree  CARDIOVASCULAR A:  Syncope/hypotension- r/t to ABLA- resolved  Hx HTN, systolic HF, CAD, NSTEMI s/p PCI on plavix, HLD,  -currently normotensive and asymptomatic P:  Tele monitoring  Goal MAP >60 met on own Hold home zetia, plavix, lopressor  Keep on tele, follow pulse pressure  RENAL A:   ESRD on TTS HD - missed HD on 8/16 due to inability to access AVG P:   To HD today Chem in am  kvo  GASTROINTESTINAL A:   Acute on chronic rectal bleeding- acute diverticular bleed vs radiation proctitis GERD P:   No bleeding overnight Some equilibration hct For 1 unit Diet per GI, advance ? Tag if bleeds No plavix now  HEMATOLOGIC A:   ABLA and chronic Anemia  - previous admit 8.6-9.6 P:  Change cbc to q12h 1 unit prbc on hd  INFECTIOUS A:   No acute process P:   Monitor fever curve/ trend WBC  ENDOCRINE A:   DMT2 Hyperthyroid  P:   CBG q 4 SSI if glucose > 150 Resume methimazole if no  Bleeding In am   NEUROLOGIC A:   Hx Depression, Chronic pain  P:   Hold preadmit neurontin, vicodin, celexa  likely to restart  Agents serially soon   FAMILY  - Updates: Patient  - Inter-disciplinary family meet or Palliative Care meeting due by:   05/24/17   Lavon Paganini. Titus Mould, MD, [place] Pgr: [facility] 05/18/2017 9:39 AM

## 2017-05-19 DIAGNOSIS — D62 Acute posthemorrhagic anemia: Secondary | ICD-10-CM

## 2017-05-19 LAB — CBC
HCT: 25.3 % — ABNORMAL LOW (ref 39.0–52.0)
HCT: 23.9 % — ABNORMAL LOW (ref 39.0–52.0)
HEMATOCRIT: 28.4 % — AB (ref 39.0–52.0)
Hemoglobin: 8.2 g/dL — ABNORMAL LOW (ref 13.0–17.0)
Hemoglobin: 7.7 g/dL — ABNORMAL LOW (ref 13.0–17.0)
Hemoglobin: 9.4 g/dL — ABNORMAL LOW (ref 13.0–17.0)
MCH: 29.4 pg (ref 26.0–34.0)
MCH: 29.2 pg (ref 26.0–34.0)
MCH: 29.6 pg (ref 26.0–34.0)
MCHC: 32.4 g/dL (ref 30.0–36.0)
MCHC: 32.2 g/dL (ref 30.0–36.0)
MCHC: 33.1 g/dL (ref 30.0–36.0)
MCV: 90.7 fL (ref 78.0–100.0)
MCV: 90.5 fL (ref 78.0–100.0)
MCV: 89.3 fL (ref 78.0–100.0)
PLATELETS: 222 10*3/uL (ref 150–400)
PLATELETS: 213 10*3/uL (ref 150–400)
Platelets: 226 10*3/uL (ref 150–400)
RBC: 2.79 MIL/uL — ABNORMAL LOW (ref 4.22–5.81)
RBC: 2.64 MIL/uL — ABNORMAL LOW (ref 4.22–5.81)
RBC: 3.18 MIL/uL — ABNORMAL LOW (ref 4.22–5.81)
RDW: 17.3 % — AB (ref 11.5–15.5)
RDW: 17.1 % — AB (ref 11.5–15.5)
RDW: 16.5 % — AB (ref 11.5–15.5)
WBC: 7.2 10*3/uL (ref 4.0–10.5)
WBC: 6.3 10*3/uL (ref 4.0–10.5)
WBC: 8.4 10*3/uL (ref 4.0–10.5)

## 2017-05-19 LAB — RENAL FUNCTION PANEL
ALBUMIN: 2.9 g/dL — AB (ref 3.5–5.0)
ANION GAP: 12 (ref 5–15)
BUN: 50 mg/dL — ABNORMAL HIGH (ref 6–20)
CALCIUM: 8.7 mg/dL — AB (ref 8.9–10.3)
CO2: 26 mmol/L (ref 22–32)
Chloride: 100 mmol/L — ABNORMAL LOW (ref 101–111)
Creatinine, Ser: 11.26 mg/dL — ABNORMAL HIGH (ref 0.61–1.24)
GFR calc Af Amer: 5 mL/min — ABNORMAL LOW (ref >60)
GFR calc non Af Amer: 4 mL/min — ABNORMAL LOW (ref >60)
Glucose, Bld: 109 mg/dL — ABNORMAL HIGH (ref 65–99)
PHOSPHORUS: 7.2 mg/dL — AB (ref 2.5–4.6)
POTASSIUM: 4.1 mmol/L (ref 3.5–5.1)
SODIUM: 138 mmol/L (ref 135–145)

## 2017-05-19 LAB — PREPARE RBC (CROSSMATCH)

## 2017-05-19 LAB — GLUCOSE, CAPILLARY
GLUCOSE-CAPILLARY: 91 mg/dL (ref 65–99)
GLUCOSE-CAPILLARY: 113 mg/dL — AB (ref 65–99)
Glucose-Capillary: 90 mg/dL (ref 65–99)

## 2017-05-19 MED ORDER — COLESEVELAM HCL 625 MG PO TABS
1875.0000 mg | ORAL_TABLET | Freq: Two times a day (BID) | ORAL | Status: DC
  Administered 2017-05-19 – 2017-05-22 (×5): 1875 mg via ORAL
  Filled 2017-05-19 (×7): qty 3

## 2017-05-19 MED ORDER — LANTHANUM CARBONATE 1000 MG PO PACK: 3000.0000 mg | PACK | Freq: Two times a day (BID) | ORAL | Status: DC

## 2017-05-19 MED ORDER — HYPROMELLOSE (GONIOSCOPIC) 2.5 % OP SOLN: 1.0000 [drp] | Freq: Three times a day (TID) | OPHTHALMIC | Status: DC | PRN

## 2017-05-19 MED ORDER — ACETAMINOPHEN 325 MG PO TABS: 650.0000 mg | ORAL_TABLET | Freq: Four times a day (QID) | ORAL | Status: DC | PRN

## 2017-05-19 MED ORDER — INSULIN ASPART 100 UNIT/ML ~~LOC~~ SOLN: 0.0000 [IU] | Freq: Every day | SUBCUTANEOUS | Status: DC

## 2017-05-19 MED ORDER — DOXERCALCIFEROL 4 MCG/2ML IV SOLN
INTRAVENOUS | Status: AC
  Administered 2017-05-19: 6 ug via INTRAVENOUS
  Filled 2017-05-19: qty 4

## 2017-05-19 MED ORDER — PANTOPRAZOLE SODIUM 40 MG PO TBEC
40.0000 mg | DELAYED_RELEASE_TABLET | Freq: Every evening | ORAL | Status: DC
Start: 2017-05-19 — End: 2017-05-22
  Administered 2017-05-20 – 2017-05-21 (×2): 40 mg via ORAL
  Filled 2017-05-19 (×2): qty 1

## 2017-05-19 MED ORDER — METHIMAZOLE 5 MG PO TABS
5.0000 mg | ORAL_TABLET | Freq: Every day | ORAL | Status: DC
  Administered 2017-05-19 – 2017-05-22 (×4): 5 mg via ORAL
  Filled 2017-05-19 (×4): qty 1

## 2017-05-19 MED ORDER — CITALOPRAM HYDROBROMIDE 10 MG PO TABS: 10.0000 mg | ORAL_TABLET | Freq: Every day | ORAL | Status: DC | PRN

## 2017-05-19 MED ORDER — METOPROLOL TARTRATE 25 MG PO TABS
25.0000 mg | ORAL_TABLET | ORAL | Status: DC
  Administered 2017-05-20 – 2017-05-22 (×4): 25 mg via ORAL
  Filled 2017-05-19 (×5): qty 1

## 2017-05-19 MED ORDER — POLYVINYL ALCOHOL 1.4 % OP SOLN
1.0000 [drp] | Freq: Three times a day (TID) | OPHTHALMIC | Status: DC | PRN
  Filled 2017-05-19: qty 15

## 2017-05-19 MED ORDER — ALLOPURINOL 100 MG PO TABS
100.0000 mg | ORAL_TABLET | Freq: Every day | ORAL | Status: DC
  Administered 2017-05-19 – 2017-05-22 (×4): 100 mg via ORAL
  Filled 2017-05-19 (×4): qty 1

## 2017-05-19 MED ORDER — CINACALCET HCL 30 MG PO TABS
90.0000 mg | ORAL_TABLET | Freq: Two times a day (BID) | ORAL | Status: DC
  Administered 2017-05-19 – 2017-05-22 (×5): 90 mg via ORAL
  Filled 2017-05-19 (×7): qty 3

## 2017-05-19 MED ORDER — INSULIN ASPART 100 UNIT/ML ~~LOC~~ SOLN
0.0000 [IU] | Freq: Three times a day (TID) | SUBCUTANEOUS | Status: DC
  Administered 2017-05-20 – 2017-05-22 (×2): 1 [IU] via SUBCUTANEOUS

## 2017-05-19 MED ORDER — EZETIMIBE 10 MG PO TABS
10.0000 mg | ORAL_TABLET | Freq: Every day | ORAL | Status: DC
  Administered 2017-05-19 – 2017-05-22 (×4): 10 mg via ORAL
  Filled 2017-05-19 (×4): qty 1

## 2017-05-19 NOTE — Progress Notes (Signed)
Dialysis treatment completed.  3000 mL ultrafiltrated and net fluid removal 2000 mL.    Patient status unchanged. Lung sounds diminished to ausculation in all fields. Generalized edema. Cardiac: NSR.  Disconnected lines and removed needles.  Pressure held for 10 minutes and band aid/gauze dressing applied.  Report given to bedside RN, Bella Kennedy.

## 2017-05-19 NOTE — Progress Notes (Addendum)
[facility] TEAM 1 - Stepdown/ICU TEAM  RYDEN WAINER  XLK:[id number] DOB: Feb 05, 1951 DOA: 05/16/2017 PCP: Biagio Borg, MD    Brief Narrative:  66yo M with Hx of ESRD on TTS HD, CAD, NSTEMI s/p PCI on plavix and ASA, HTN, systolic CHF (EF 36-64% 4/03), DM2, HLD, and OSA on CPAP who presented to the ER with the acute onset of painless rectal bleeding.  Patient has a history of chronic GI bleeding followed by [facility] with recent hospitalization for same on 7/23- 7/28.  Patient underwent colonoscopy 7/27 which showed a small polyp in the ascending colon which was removed, diverticulosis of entire colon, internal hemorrhoids, and petechiae in rectum questionably radiation proctitis treated with argon plasma coagulation.  At that visit his rectal bleeding resolved and the patient was resumed on plavix alone at discharge.  In the ER the patient experienced BRBPR with large clots x4, and had a syncopal episode w/ transient hypotension. He was treated with emergency release 1 unit PRBC and platelets, and DDAVP.  Blood pressure stabilized.  GI and IR were consulted and patient went for a tagged RBC study.  Hgb was note to have dropped to 9.1 from 10.5 from last admit.   Significant Events: 7/27 D/C after hospitalization for BRBPR 8/16 Admit w/ BRBPR 8/17 neg nuc med bleeding scan 8/18 no bleeding  Subjective: The patient is resting comfortably in bed.  He tolerated his diet without difficulty.  He denies any further bright red blood per rectum.  He denies chest pain shortness breath or abdominal pain.  Assessment & Plan:  Acute on chronic rectal bleeding Acute diverticular bleed versus radiation proctitis - appears to have resolved - Plavix on hold and pt given plt transfusion - nuc med bleeding scan not helpful - GI suggesting RFA therapy for proctitis on Tuesday   Hypotension with syncope Due to acute blood loss + vagal reaction - resolved w BP now elevated   Acute blood loss anemia on chronic  anemia of kidney disease  Transfused 2 units PRBC thus far - follow in serial fashion - with history of coronary disease target his hemoglobin 8.0 or greater - as patient is borderline will plan to transfuse additional unit today with dialysis  Chronic systolic congestive heart failure Well compensated at this time - volume control per HD  Filed Weights   05/17/17 1900 05/18/17 0500 05/19/17 0500  Weight: 105.4 kg (232 lb 5.8 oz) 102 kg (224 lb 13.9 oz) 105.9 kg (233 lb 7.5 oz)    Coronary artery disease status post PCI on chronic Plavix plavix on hold for now due to acute bleeding  ESRD on hemodialysis Nephrology following   OSA on chronic C Pap  DM 2 CBG well controlled at this time  Hyperthyroidism Resume tapazole   Depression  Chronic pain Well controlled at this time  DVT prophylaxis: SCDs Code Status: FULL CODE Family Communication: Spoke with wife at bedside Disposition Plan: Transfer to telemetry bed - follow CBC in serial fashion - for flexible sigmoidoscopy on Tuesday  Consultants:  GI PCCM  Antimicrobials:  none  Objective: Blood pressure (!) 144/64, pulse 68, temperature 98.1 F (36.7 C), temperature source Oral, resp. rate 14, height 6\' 1"  (1.854 m), weight 105.9 kg (233 lb 7.5 oz), SpO2 98 %. No intake or output data in the 24 hours ending 05/19/17 1051 Filed Weights   05/17/17 1900 05/18/17 0500 05/19/17 0500  Weight: 105.4 kg (232 lb 5.8 oz) 102 kg (224 lb 13.9  oz) 105.9 kg (233 lb 7.5 oz)    Examination: General: No acute respiratory distress Lungs: Clear to auscultation bilaterally without wheezes or crackles Cardiovascular: Regular rate and rhythm without murmur gallop or rub normal S1 and S2 Abdomen: Nontender, overweight, soft, bowel sounds positive, no rebound, no ascites, no appreciable mass Extremities: trace B LE edema   CBC:  Recent Labs Lab 05/16/17 2115  05/17/17 1214 05/17/17 2004 05/18/17 0213 05/18/17 2254 05/19/17 0936    WBC 7.4  --   --  8.4 7.3 7.2 6.3  HGB 9.1*  < > 6.4* 7.8* 7.3* 8.2* 7.7*  HCT 29.3*  < > 19.5* 24.4* 22.7* 25.3* 23.9*  MCV 93.0  --   --  91.0 89.7 90.7 90.5  PLT 328  --   --  213 196 222 226  < > = values in this interval not displayed. Basic Metabolic Panel:  Recent Labs Lab 05/16/17 2115 05/17/17 0715 05/18/17 0213  NA 143 144 139  K 4.6 4.9 4.0  CL 105 109 101  CO2 24 23 26   GLUCOSE 145* 148* 92  BUN 58* 62* 29*  CREATININE 11.42* 11.59* 7.39*  CALCIUM 8.8* 8.0* 7.5*  MG  --  2.1  --   PHOS  --  5.3* 3.9   GFR: Estimated Creatinine Clearance: 12.2 mL/min (A) (by C-G formula based on SCr of 7.39 mg/dL (H)).  Liver Function Tests:  Recent Labs Lab 05/18/17 0213  ALBUMIN 2.9*    HbA1C: Hemoglobin A1C  Date/Time Value Ref Range Status  01/23/2017 03:42 PM 6.5  Final  10/24/2016 04:31 PM 5.5  Final   Hgb A1c MFr Bld  Date/Time Value Ref Range Status  06/18/2016 05:15 PM 7.3 (H) 4.6 - 6.5 % Final    Comment:    Glycemic Control Guidelines for People with Diabetes:Non Diabetic:  <6%Goal of Therapy: <7%Additional Action Suggested:  >8%   07/20/2015 01:19 PM 6.1 (H) 4.8 - 5.6 % Final    Comment:    (NOTE)         Pre-diabetes: 5.7 - 6.4         Diabetes: >6.4         Glycemic control for adults with diabetes: <7.0     CBG:  Recent Labs Lab 05/18/17 0831 05/18/17 1203 05/18/17 1625 05/18/17 2021 05/19/17 0847  GLUCAP 92 104* 89 125* 90    Recent Results (from the past 240 hour(s))  MRSA PCR Screening     Status: None   Collection Time: 05/17/17  1:51 AM  Result Value Ref Range Status   MRSA by PCR NEGATIVE NEGATIVE Final    Comment:        The GeneXpert MRSA Assay (FDA approved for NASAL specimens only), is one component of a comprehensive MRSA colonization surveillance program. It is not intended to diagnose MRSA infection nor to guide or monitor treatment for MRSA infections.      Scheduled Meds: . chlorhexidine  15 mL Mouth Rinse  BID  . citalopram  10 mg Oral Daily  . darbepoetin (ARANESP) injection - DIALYSIS  200 mcg Intravenous Q Fri-HD  . doxercalciferol  6 mcg Intravenous Q T,Th,Sa-HD  . gabapentin  300 mg Oral QHS  . lanthanum  1,000 mg Oral TID WC  . mouth rinse  15 mL Mouth Rinse q12n4p  . midodrine  10 mg Oral Q T,Th,Sat-1800  . multivitamin  1 tablet Oral QHS  . pantoprazole (PROTONIX) IV  40 mg Intravenous Daily  LOS: 2 days   Cherene Altes, MD [facility] Office  [phone] Pager - Text Page per Amion as per below:  On-Call/Text Page:      [email or web address]      password [email or web address]  If 7PM-7AM, please contact night-coverage [email or web address] Password [facility] 05/19/2017, 10:51 AM

## 2017-05-19 NOTE — Progress Notes (Signed)
Patient arrived to unit per bed.  Reviewed treatment plan and this RN agrees.  Report received from bedside RN, Zyril.  Consent verified.  Patient A & O X 4. Lung sounds clear to ausculation in all fields. BLE 1+ edema. Cardiac: NSR.  Prepped RUAVF with alcohol and cannulated with two 16 gauge needles.  Pulsation of blood noted.  Flushed access well with saline per protocol.  Connected and secured lines and initiated tx at [place].  UF goal of 2500 mL and net fluid removal of 2000 mL.  Will continue to monitor.

## 2017-05-19 NOTE — Progress Notes (Signed)
RN called HD to see if pt would still receive dialysis tonight. HD RN stated "no", that the patient would receive HD in the morning. Pt notified. Nursing will continue to monitor.

## 2017-05-19 NOTE — Progress Notes (Signed)
Subjective: The patient was seen and examined at bedside. He is scheduled for his hemodialysis today. He had 1 bowel movement yesterday morning and 1 yesterday evening, none today. He describes his stools as light brown, and has not noted blood in stool for 2 days. He is now on a renal diet, denies nausea, vomiting or abdominal pain..  Objective: Vital signs in last 24 hours: Temp:  [98.1 F (36.7 C)-98.5 F (36.9 C)] 98.1 F (36.7 C) (08/19 0848) Pulse Rate:  [66-81] 68 (08/19 0600) Resp:  [11-31] 14 (08/19 0600) BP: (122-210)/(60-144) 144/64 (08/19 0600) SpO2:  [94 %-99 %] 98 % (08/19 0600) Weight:  [105.9 kg (233 lb 7.5 oz)] 105.9 kg (233 lb 7.5 oz) (08/19 0500) Weight change: -1.5 kg (-3 lb 4.9 oz) Last BM Date: 05/19/17  PE: Overweight, mild pallor GENERAL: Not in acute distress ABDOMEN: Soft, nondistended, nontender, normoactive bowel sounds EXTREMITIES: No edema noted  Lab Results: Results for orders placed or performed during the hospital encounter of 05/16/17 (from the past 48 hour(s))  Prepare RBC     Status: None   Collection Time: 05/17/17 10:27 AM  Result Value Ref Range   Order Confirmation ORDER PROCESSED BY BLOOD BANK   Glucose, capillary     Status: Abnormal   Collection Time: 05/17/17 11:36 AM  Result Value Ref Range   Glucose-Capillary 102 (H) 65 - 99 mg/dL   Comment 1 Capillary Specimen    Comment 2 Notify RN   Hemoglobin and hematocrit, blood     Status: Abnormal   Collection Time: 05/17/17 12:14 PM  Result Value Ref Range   Hemoglobin 6.4 (LL) 13.0 - 17.0 g/dL    Comment: REPEATED TO VERIFY CRITICAL RESULT CALLED TO, READ BACK BY AND VERIFIED WITH: S.TUTTLE RN 1240 05/17/17 HDAY    HCT 19.5 (L) 39.0 - 52.0 %  Glucose, capillary     Status: Abnormal   Collection Time: 05/17/17  3:34 PM  Result Value Ref Range   Glucose-Capillary 151 (H) 65 - 99 mg/dL   Comment 1 Capillary Specimen    Comment 2 Notify RN   CBC     Status: Abnormal   Collection  Time: 05/17/17  8:04 PM  Result Value Ref Range   WBC 8.4 4.0 - 10.5 K/uL   RBC 2.68 (L) 4.22 - 5.81 MIL/uL   Hemoglobin 7.8 (L) 13.0 - 17.0 g/dL   HCT 24.4 (L) 39.0 - 52.0 %   MCV 91.0 78.0 - 100.0 fL   MCH 29.1 26.0 - 34.0 pg   MCHC 32.0 30.0 - 36.0 g/dL   RDW 17.6 (H) 11.5 - 15.5 %   Platelets 213 150 - 400 K/uL  Glucose, capillary     Status: Abnormal   Collection Time: 05/17/17  8:48 PM  Result Value Ref Range   Glucose-Capillary 156 (H) 65 - 99 mg/dL   Comment 1 Notify RN   CBC     Status: Abnormal   Collection Time: 05/18/17  2:13 AM  Result Value Ref Range   WBC 7.3 4.0 - 10.5 K/uL   RBC 2.53 (L) 4.22 - 5.81 MIL/uL   Hemoglobin 7.3 (L) 13.0 - 17.0 g/dL   HCT 22.7 (L) 39.0 - 52.0 %   MCV 89.7 78.0 - 100.0 fL   MCH 28.9 26.0 - 34.0 pg   MCHC 32.2 30.0 - 36.0 g/dL   RDW 17.5 (H) 11.5 - 15.5 %   Platelets 196 150 - 400 K/uL  Renal function panel  Status: Abnormal   Collection Time: 05/18/17  2:13 AM  Result Value Ref Range   Sodium 139 135 - 145 mmol/L   Potassium 4.0 3.5 - 5.1 mmol/L    Comment: DELTA CHECK NOTED   Chloride 101 101 - 111 mmol/L   CO2 26 22 - 32 mmol/L   Glucose, Bld 92 65 - 99 mg/dL   BUN 29 (H) 6 - 20 mg/dL   Creatinine, Ser 7.39 (H) 0.61 - 1.24 mg/dL    Comment: DELTA CHECK NOTED   Calcium 7.5 (L) 8.9 - 10.3 mg/dL   Phosphorus 3.9 2.5 - 4.6 mg/dL   Albumin 2.9 (L) 3.5 - 5.0 g/dL   GFR calc non Af Amer 7 (L) >60 mL/min   GFR calc Af Amer 8 (L) >60 mL/min    Comment: (NOTE) The eGFR has been calculated using the CKD EPI equation. This calculation has not been validated in all clinical situations. eGFR's persistently <60 mL/min signify possible Chronic Kidney Disease.    Anion gap 12 5 - 15  Glucose, capillary     Status: None   Collection Time: 05/18/17  8:31 AM  Result Value Ref Range   Glucose-Capillary 92 65 - 99 mg/dL   Comment 1 Capillary Specimen    Comment 2 Notify RN   Glucose, capillary     Status: Abnormal   Collection  Time: 05/18/17 12:03 PM  Result Value Ref Range   Glucose-Capillary 104 (H) 65 - 99 mg/dL   Comment 1 Capillary Specimen    Comment 2 Notify RN   Glucose, capillary     Status: None   Collection Time: 05/18/17  4:25 PM  Result Value Ref Range   Glucose-Capillary 89 65 - 99 mg/dL   Comment 1 Capillary Specimen    Comment 2 Notify RN   Glucose, capillary     Status: Abnormal   Collection Time: 05/18/17  8:21 PM  Result Value Ref Range   Glucose-Capillary 125 (H) 65 - 99 mg/dL   Comment 1 Notify RN   CBC     Status: Abnormal   Collection Time: 05/18/17 10:54 PM  Result Value Ref Range   WBC 7.2 4.0 - 10.5 K/uL   RBC 2.79 (L) 4.22 - 5.81 MIL/uL   Hemoglobin 8.2 (L) 13.0 - 17.0 g/dL   HCT 25.3 (L) 39.0 - 52.0 %   MCV 90.7 78.0 - 100.0 fL   MCH 29.4 26.0 - 34.0 pg   MCHC 32.4 30.0 - 36.0 g/dL   RDW 17.3 (H) 11.5 - 15.5 %   Platelets 222 150 - 400 K/uL  Glucose, capillary     Status: None   Collection Time: 05/19/17  8:47 AM  Result Value Ref Range   Glucose-Capillary 90 65 - 99 mg/dL   Comment 1 Capillary Specimen    Comment 2 Notify RN    *Note: Due to a large number of results and/or encounters for the requested time period, some results have not been displayed. A complete set of results can be found in Results Review.    Studies/Results: Dg Chest Port 1 View  Result Date: 05/18/2017 CLINICAL DATA:  Pulmonary edemaHx of CP, CAD, DM2, ESRD, PSVT, pulmonary nodule (RRL) EXAM: PORTABLE CHEST 1 VIEW COMPARISON:  04/22/2017 FINDINGS: The cardiac silhouette is normal in size. No mediastinal or hilar masses. No evidence of adenopathy. Mild linear scarring or chronic atelectasis in the left mid lung. Lungs are otherwise clear. No pleural effusion. No pneumothorax. Right arm vascular stent  is stable. There stable changes from a previous cervical spine fusion. IMPRESSION: No acute cardiopulmonary disease. Electronically Signed   By: Lajean Manes M.D.   On: 05/18/2017 07:38     Medications: I have reviewed the patient's current medications.  Assessment: 1. Painless hematochezia, acute related to diverticular bleeding, resolved, negative bleeding scan. 2. Radiation proctitis, off Plavix for 3 days today. 3. End-stage renal disease on hemodialysis  Plan: 1. Continue H&H monitoring, transfuse as needed 2. Continue to hold Plavix, possible flexible sigmoidoscopy for RFA for radiation proctitis on Tuesday. Plan to keep patient NPO post midnight on Monday.  I spoke at length with patient's wife, who seemed upset regarding patient not getting a colonoscopy for painless hematochezia immediately on presentation to the ER. Patient had a colonoscopy on 04/26/2017 which showed diverticulosis all over the colon and radiation proctitis. Discussed with her, that a stat bleeding scan is more appropriate for active bleeding, in order to identify the site of bleeding and possible embolization. Also explained to the patient and his wife, that colonoscopy would require taking prep for mucosal visualization and possible therapeutic intervention.   Ronnette Juniper 05/19/2017, 8:59 AM   Pager [phone] If no answer or after 5 PM call [phone]

## 2017-05-19 NOTE — Progress Notes (Signed)
S: Denies any further bloody stools O:BP (!) 144/64   Pulse 68   Temp 98.3 F (36.8 C) (Oral)   Resp 14   Ht 6\' 1"  (1.854 m)   Wt 105.9 kg (233 lb 7.5 oz)   SpO2 98%   BMI 30.80 kg/m  No intake or output data in the 24 hours ending 05/19/17 0709 Weight change: -1.5 kg (-3 lb 4.9 oz) YKZ:LDJTT and alert  Wearing CPAP CVS: RRR Resp:Clear Abd:+ BS NTND Ext: No edema RUA AVF + bruit NEURO:CNI Ox3 No asterixis   . chlorhexidine  15 mL Mouth Rinse BID  . citalopram  10 mg Oral Daily  . darbepoetin (ARANESP) injection - DIALYSIS  200 mcg Intravenous Q Fri-HD  . doxercalciferol  6 mcg Intravenous Q T,Th,Sa-HD  . gabapentin  300 mg Oral QHS  . lanthanum  1,000 mg Oral TID WC  . mouth rinse  15 mL Mouth Rinse q12n4p  . midodrine  10 mg Oral Q T,Th,Sat-1800  . multivitamin  1 tablet Oral QHS  . pantoprazole (PROTONIX) IV  40 mg Intravenous Daily   Dg Chest Port 1 View  Result Date: 05/18/2017 CLINICAL DATA:  Pulmonary edemaHx of CP, CAD, DM2, ESRD, PSVT, pulmonary nodule (RRL) EXAM: PORTABLE CHEST 1 VIEW COMPARISON:  04/22/2017 FINDINGS: The cardiac silhouette is normal in size. No mediastinal or hilar masses. No evidence of adenopathy. Mild linear scarring or chronic atelectasis in the left mid lung. Lungs are otherwise clear. No pleural effusion. No pneumothorax. Right arm vascular stent is stable. There stable changes from a previous cervical spine fusion. IMPRESSION: No acute cardiopulmonary disease. Electronically Signed   By: Lajean Manes M.D.   On: 05/18/2017 07:38   BMET    Component Value Date/Time   NA 139 05/18/2017 0213   NA 143 11/20/2016 0842   K 4.0 05/18/2017 0213   CL 101 05/18/2017 0213   CO2 26 05/18/2017 0213   GLUCOSE 92 05/18/2017 0213   BUN 29 (H) 05/18/2017 0213   BUN 59 (H) 11/20/2016 0842   CREATININE 7.39 (H) 05/18/2017 0213   CALCIUM 7.5 (L) 05/18/2017 0213   GFRNONAA 7 (L) 05/18/2017 0213   GFRAA 8 (L) 05/18/2017 0213   CBC    Component Value  Date/Time   WBC 7.2 05/18/2017 2254   RBC 2.79 (L) 05/18/2017 2254   HGB 8.2 (L) 05/18/2017 2254   HGB 11.7 (L) 11/20/2016 0842   HCT 25.3 (L) 05/18/2017 2254   HCT 35.6 (L) 11/20/2016 0842   PLT 222 05/18/2017 2254   PLT 304 11/20/2016 0842   MCV 90.7 05/18/2017 2254   MCV 93 11/20/2016 0842   MCH 29.4 05/18/2017 2254   MCHC 32.4 05/18/2017 2254   RDW 17.3 (H) 05/18/2017 2254   RDW 18.2 (H) 11/20/2016 0842   LYMPHSABS 1.7 05/03/2017 1612   MONOABS 1.1 (H) 05/03/2017 1612   EOSABS 0.2 05/03/2017 1612   BASOSABS 0.1 05/03/2017 1612     Assessment:  1. Lower GI bleed 2. Anemia sec ESRD and now GI bleed 3. Sec HPTH 4. CAD 5. ESRD TTS AF   Plan: 1, HD today as was not done yest [name] T

## 2017-05-20 ENCOUNTER — Other Ambulatory Visit: Payer: Self-pay | Admitting: Gastroenterology

## 2017-05-20 ENCOUNTER — Other Ambulatory Visit: Payer: Self-pay

## 2017-05-20 LAB — CBC
HCT: 29.7 % — ABNORMAL LOW (ref 39.0–52.0)
HEMATOCRIT: 27.5 % — AB (ref 39.0–52.0)
HEMOGLOBIN: 9.6 g/dL — AB (ref 13.0–17.0)
Hemoglobin: 8.9 g/dL — ABNORMAL LOW (ref 13.0–17.0)
MCH: 29.1 pg (ref 26.0–34.0)
MCH: 29.8 pg (ref 26.0–34.0)
MCHC: 32.4 g/dL (ref 30.0–36.0)
MCHC: 32.3 g/dL (ref 30.0–36.0)
MCV: 89.9 fL (ref 78.0–100.0)
MCV: 92.2 fL (ref 78.0–100.0)
Platelets: 226 10*3/uL (ref 150–400)
Platelets: 266 10*3/uL (ref 150–400)
RBC: 3.06 MIL/uL — ABNORMAL LOW (ref 4.22–5.81)
RBC: 3.22 MIL/uL — AB (ref 4.22–5.81)
RDW: 16.8 % — AB (ref 11.5–15.5)
RDW: 17.5 % — ABNORMAL HIGH (ref 11.5–15.5)
WBC: 6.4 10*3/uL (ref 4.0–10.5)
WBC: 7.6 10*3/uL (ref 4.0–10.5)

## 2017-05-20 LAB — GLUCOSE, CAPILLARY
GLUCOSE-CAPILLARY: 129 mg/dL — AB (ref 65–99)
Glucose-Capillary: 117 mg/dL — ABNORMAL HIGH (ref 65–99)
Glucose-Capillary: 118 mg/dL — ABNORMAL HIGH (ref 65–99)
Glucose-Capillary: 120 mg/dL — ABNORMAL HIGH (ref 65–99)
Glucose-Capillary: 125 mg/dL — ABNORMAL HIGH (ref 65–99)
Glucose-Capillary: 134 mg/dL — ABNORMAL HIGH (ref 65–99)

## 2017-05-20 LAB — TYPE AND SCREEN
ABO/RH(D): O POS
ANTIBODY SCREEN: NEGATIVE
UNIT DIVISION: 0
UNIT DIVISION: 0
Unit division: 0

## 2017-05-20 LAB — BPAM RBC
Blood Product Expiration Date: 201809082359
Blood Product Expiration Date: 201809092359
Blood Product Expiration Date: 201809122359
ISSUE DATE / TIME: 201808162302
ISSUE DATE / TIME: 201808171422
ISSUE DATE / TIME: 201808191924
UNIT TYPE AND RH: 5100
Unit Type and Rh: 5100
Unit Type and Rh: 5100

## 2017-05-20 LAB — HEMOGLOBIN A1C
Hgb A1c MFr Bld: 5.7 % — ABNORMAL HIGH (ref 4.8–5.6)
MEAN PLASMA GLUCOSE: 116.89 mg/dL

## 2017-05-20 MED ORDER — SODIUM CHLORIDE 0.9 % IV SOLN: 100.0000 mL | INTRAVENOUS | Status: DC | PRN

## 2017-05-20 MED ORDER — GABAPENTIN 300 MG PO CAPS
300.0000 mg | ORAL_CAPSULE | Freq: Once | ORAL | Status: AC
  Administered 2017-05-20: 300 mg via ORAL
  Filled 2017-05-20: qty 1

## 2017-05-20 MED ORDER — LIDOCAINE HCL (PF) 1 % IJ SOLN: 5.0000 mL | INTRAMUSCULAR | Status: DC | PRN

## 2017-05-20 MED ORDER — GABAPENTIN 300 MG PO CAPS
600.0000 mg | ORAL_CAPSULE | Freq: Every day | ORAL | Status: DC
  Administered 2017-05-21: 600 mg via ORAL
  Filled 2017-05-20: qty 2

## 2017-05-20 MED ORDER — LIDOCAINE-PRILOCAINE 2.5-2.5 % EX CREA: 1.0000 "application " | TOPICAL_CREAM | CUTANEOUS | Status: DC | PRN

## 2017-05-20 MED ORDER — HEPARIN SODIUM (PORCINE) 1000 UNIT/ML DIALYSIS
1000.0000 [IU] | INTRAMUSCULAR | Status: DC | PRN
  Filled 2017-05-20: qty 1

## 2017-05-20 MED ORDER — PENTAFLUOROPROP-TETRAFLUOROETH EX AERO: 1.0000 "application " | INHALATION_SPRAY | CUTANEOUS | Status: DC | PRN

## 2017-05-20 MED ORDER — ALTEPLASE 2 MG IJ SOLR: 2.0000 mg | Freq: Once | INTRAMUSCULAR | Status: DC | PRN

## 2017-05-20 MED ORDER — SODIUM CHLORIDE 0.9 % IV SOLN: INTRAVENOUS | Status: DC

## 2017-05-20 NOTE — Progress Notes (Signed)
[facility] TEAM 1 - Stepdown/ICU TEAM  [name]  KGY:[id number] DOB: April 15, 1951 DOA: 05/16/2017 PCP: Biagio Borg, MD    Brief Narrative:  66yo M with Hx of ESRD on TTS HD, CAD s/p PCI on plavix and ASA, HTN, systolic CHF (EF 02-63% 7/85), DM2, HLD, and OSA on CPAP who presented to the ER with the acute onset of painless rectal bleeding.  Patient has a history of chronic GI bleeding followed by [facility] with recent hospitalization for same on 7/23- 7/28.  Patient underwent colonoscopy 7/27 which showed a small polyp in the ascending colon which was removed, diverticulosis of entire colon, internal hemorrhoids, and petechiae in rectum questionably radiation proctitis treated with argon plasma coagulation.  At that visit his rectal bleeding resolved and the patient was resumed on plavix alone at discharge.  In the ER the patient experienced BRBPR with large clots x4, and had a syncopal episode w/ transient hypotension. He was treated with emergency release 1 unit PRBC and platelets, and DDAVP.  Blood pressure stabilized.  GI and IR were consulted and patient went for a tagged RBC study.  Hgb was note to have dropped to 9.1 from 10.5 from last admit.   Significant Events: 7/27 D/C after hospitalization for BRBPR 8/16 Admit w/ BRBPR 8/17 neg nuc med bleeding scan 8/18 no bleeding  Subjective: Resting comfortably.  No complaints.  Denies cp, sob, dizziness, vertigo, or nausea.    Assessment & Plan:  Acute on chronic rectal bleeding Acute diverticular bleed versus radiation proctitis - appears to have resolved - Plavix on hold and pt given plt transfusion - nuc med bleeding scan not helpful - GI suggesting RFA therapy for proctitis on Tuesday   Hypotension with syncope Due to acute blood loss + vagal reaction - resolved w BP now elevated   Acute blood loss anemia on chronic anemia of kidney disease  Transfused 3 units PRBC thus far - follow in serial fashion - with history of coronary  disease his hemoglobin target is 8.0 or greater   Chronic systolic congestive heart failure Well compensated at this time - volume control per HD  Filed Weights   05/19/17 1825 05/19/17 2235 05/20/17 0440  Weight: 105.9 kg (233 lb 7.5 oz) 103.9 kg (229 lb 0.9 oz) 103.9 kg (229 lb 0.9 oz)    Coronary artery disease status post PCI on chronic Plavix plavix on hold for now due to acute bleeding - asymptomatic presently   ESRD on hemodialysis Nephrology following   OSA on chronic CPAP  DM 2 CBG well controlled at this time - A1c 5.7 - reports that he follows a diabetic diet as outpt but requires no meds   Hyperthyroidism Cont tapazole   Depression  Chronic pain Well controlled at this time  DVT prophylaxis: SCDs Code Status: FULL CODE Family Communication: no family present at  Disposition Plan: Transfer to telemetry bed - follow CBC in serial fashion - for flexible sigmoidoscopy on Tuesday  Consultants:  GI PCCM  Antimicrobials:  none  Objective: Blood pressure (!) 170/46, pulse 75, temperature 98.7 F (37.1 C), temperature source Axillary, resp. rate 15, height 6\' 1"  (1.854 m), weight 103.9 kg (229 lb 0.9 oz), SpO2 97 %.  Intake/Output Summary (Last 24 hours) at 05/20/17 0959 Last data filed at 05/19/17 2319  Gross per 24 hour  Intake              555 ml  Output  2000 ml  Net            -1445 ml   Filed Weights   05/19/17 1825 05/19/17 2235 05/20/17 0440  Weight: 105.9 kg (233 lb 7.5 oz) 103.9 kg (229 lb 0.9 oz) 103.9 kg (229 lb 0.9 oz)    Examination: General: No acute respiratory distress - alert  Lungs: CTA B - no wheezing or crackles  Cardiovascular: RRR - no M or rub  Abdomen: Nontender, overweight, soft, bowel sounds positive, no rebound, no ascites, no appreciable mass Extremities: trace B LE edema w/o change   CBC:  Recent Labs Lab 05/18/17 0213 05/18/17 2254 05/19/17 0936 05/19/17 2320 05/20/17 0217  WBC 7.3 7.2 6.3 8.4 6.4    HGB 7.3* 8.2* 7.7* 9.4* 8.9*  HCT 22.7* 25.3* 23.9* 28.4* 27.5*  MCV 89.7 90.7 90.5 89.3 89.9  PLT 196 222 226 213 976   Basic Metabolic Panel:  Recent Labs Lab 05/16/17 2115 05/17/17 0715 05/18/17 0213 05/19/17 1913  NA 143 144 139 138  K 4.6 4.9 4.0 4.1  CL 105 109 101 100*  CO2 24 23 26 26   GLUCOSE 145* 148* 92 109*  BUN 58* 62* 29* 50*  CREATININE 11.42* 11.59* 7.39* 11.26*  CALCIUM 8.8* 8.0* 7.5* 8.7*  MG  --  2.1  --   --   PHOS  --  5.3* 3.9 7.2*   GFR: Estimated Creatinine Clearance: 7.9 mL/min (A) (by C-G formula based on SCr of 11.26 mg/dL (H)).  Liver Function Tests:  Recent Labs Lab 05/18/17 0213 05/19/17 1913  ALBUMIN 2.9* 2.9*    HbA1C: Hemoglobin A1C  Date/Time Value Ref Range Status  01/23/2017 03:42 PM 6.5  Final  10/24/2016 04:31 PM 5.5  Final   Hgb A1c MFr Bld  Date/Time Value Ref Range Status  05/20/2017 02:17 AM 5.7 (H) 4.8 - 5.6 % Final    Comment:    (NOTE) Pre diabetes:          5.7%-6.4% Diabetes:              >6.4% Glycemic control for   <7.0% adults with diabetes   06/18/2016 05:15 PM 7.3 (H) 4.6 - 6.5 % Final    Comment:    Glycemic Control Guidelines for People with Diabetes:Non Diabetic:  <6%Goal of Therapy: <7%Additional Action Suggested:  >8%     CBG:  Recent Labs Lab 05/19/17 0847 05/19/17 1218 05/19/17 1617 05/20/17 0002 05/20/17 0803  GLUCAP 90 91 113* 120* 134*    Recent Results (from the past 240 hour(s))  MRSA PCR Screening     Status: None   Collection Time: 05/17/17  1:51 AM  Result Value Ref Range Status   MRSA by PCR NEGATIVE NEGATIVE Final    Comment:        The GeneXpert MRSA Assay (FDA approved for NASAL specimens only), is one component of a comprehensive MRSA colonization surveillance program. It is not intended to diagnose MRSA infection nor to guide or monitor treatment for MRSA infections.      Scheduled Meds: . allopurinol  100 mg Oral Daily  . chlorhexidine  15 mL Mouth Rinse  BID  . cinacalcet  90 mg Oral BID WC  . citalopram  10 mg Oral Daily  . colesevelam  1,875 mg Oral BID WC  . darbepoetin (ARANESP) injection - DIALYSIS  200 mcg Intravenous Q Fri-HD  . doxercalciferol  6 mcg Intravenous Q T,Th,Sa-HD  . ezetimibe  10 mg Oral Daily  .  gabapentin  300 mg Oral QHS  . insulin aspart  0-5 Units Subcutaneous QHS  . insulin aspart  0-9 Units Subcutaneous TID WC  . lanthanum  1,000 mg Oral TID WC  . mouth rinse  15 mL Mouth Rinse q12n4p  . methimazole  5 mg Oral Daily  . metoprolol tartrate  25 mg Oral 2 times per day on Sun Mon Wed Fri  . midodrine  10 mg Oral Q T,Th,Sat-1800  . multivitamin  1 tablet Oral QHS  . pantoprazole  40 mg Oral QPM     LOS: 3 days   Cherene Altes, MD [facility] Office  [phone] Pager - Text Page per Amion as per below:  On-Call/Text Page:      [email or web address]      password [email or web address]  If 7PM-7AM, please contact night-coverage [email or web address] Password [email or web address] 05/20/2017, 9:59 AM

## 2017-05-20 NOTE — Consult Note (Signed)
   [facility] CM Inpatient Consult   05/20/2017  [name] Aug 17, 1951 [id number]    Patient is currently active with [facility] for chronic disease management services.  Patient has been engaged by a [employer]. Our community based plan of care has focused on disease management and community resource support.  Patient will receive a post discharge transition of care call and will be evaluated for monthly home visits for assessments and disease process education. Patient is currently in ICU.  Will follow up for disposition needs and progression.   Of note, [facility] Care Management services does not replace or interfere with any services that are needed or arranged by inpatient case management or social work.  For additional questions or referrals please contact:   Natividad Brood, RN BSN [facility] Liaison  [phone] business mobile phone Toll free office [phone]

## 2017-05-20 NOTE — Progress Notes (Signed)
Pt transferring to 5W36 report given to [facility] pt stable vital signs within normal limits to be transferred by wheelchair and two RN's.

## 2017-05-20 NOTE — Progress Notes (Signed)
[name] 4:18 PM  Subjective: Patient seen and examined and hospital computer chart reviewed and case discussed with my partner Dr. Raliegh Ip and he and I re-discussed his ER experience with acute bleeding and answered all of his questions and discussed his procedure tomorrow including the risks and the difference between radiation bleeding and diverticular bleeding  Objective: Vital signs stable afebrile no acute distress abdomen is soft nontender hemoglobin stable  Assessment: Resolved GI bleeding in a patient with multiple medical problems  Plan: As above we discussed flexible sigmoidoscopy with APC or RFA with sedation including the risks and warnings and will proceed tomorrow at 9 AM and I explained clear liquids tonight and 2 enemas in the morning [facility] E  Pager [phone] After 5PM or if no answer call [phone]

## 2017-05-20 NOTE — Patient Outreach (Signed)
[facility]) Care Management  05/20/17  [name] September 30, 1951 [id number]  RNCM received voicemail from patient's wife, Maahir Horst. Patient information verified.   Mrs. Tiger voiced concerns about patient's recent bleeding episode. She stated that he went to dialysis on Thursday last week and they were not able to access his catheter. Later that evening, he went to the bathroom and began calling for her. She reported that he was bleeding really bad and had large clots, stating that they were larger than both her fists put together. She stated that she convinced him to go to the ED and called 911. She stated she was very upset because he was bleeding so bad and she had never seen clots that big in her life. She stated that his clothes were saturated and she had to get someone to help clean him up several times due to the bleeding. She stated that initially, she was told that the GI doctor was coming "in 45 minutes." Then, they were told that the GI doctor was not coming and she was unable to learn why he did not come to see patient in the emergency room. She stated that he was still bleeding badly with large clots and that he said was feeling bad. She stated that he "started to holler and then his eyes rolled back in his head and tongue protruded out and she yelled for help that he was having a seizure." She stated he was rushed to the trauma bay and started "getting blood and all kinds of help." Once he was stabilized, he was admitted to ICU.  She stated that she upset because she does not understand why the GI doctor never came to see him in the ED. RNCM educated patient that typically, the doctors run tests and labs to check on patient status and that they consult with many specialist providers, such as the GI doctor via the phone about what steps to take. Advised that sometimes, specialists do not necessarily come in to the ED to see a patient, but will recommend steps to take,  such as additional testing to be done or admitting patient, and that they do see patient once admitted. Advised that RNCM is not aware of what transpired specifically, but that they consulted with the GI doctor who was on call and he or she advised them of what they needed to do.  She verbalized understanding but is still frustrated with the whole process. She feels that they were just letting him bleed and RNCM reminded her that they have to find out what is going on before they can specifically treat or stop the bleeding. RNCM offered emotional support as the events that patient went through, including the profuse bleeding and need for resuscitation were very stressful and alarming for her.  Mrs. Bartoszek stated that she was supposed to get patient back into rehabilitation for strengthening. His heart doctor had recommended home health PT, but patient prefers outpatient because he can use the machines there. RNCM encouraged her to talk with the doctor prior to discharge to determine when it is safe to resume therapy.  She stated that he was supposed to be moved out of ICU to another room today. She stated that he has not had any bleeding in a couple of days, but is having trouble with his blood pressure. She stated that at one time, it was "233/120 something." She stated that he has been stable enough to move to another room and tomorrow he  is having a test done to see if they can find the cause of the bleeding. She stated that he has just had a hard time and she does not feel he would be up to a home visit this week. RNCM advised will follow up with patient telephonically once he is discharged and home visit was scheduled for next week. RNCM encouraged patient and/or his wife to call with any needs/concerns and she verbalized understanding.  [place] R. Keylan Costabile, RN, BSN, [facility] Coordinator [phone]

## 2017-05-20 NOTE — Anesthesia Preprocedure Evaluation (Addendum)
Anesthesia Evaluation  Patient identified by MRN, date of birth, ID band Patient awake    Reviewed: Allergy & Precautions, NPO status , Patient's Chart, lab work & pertinent test results, reviewed documented beta blocker date and time   History of Anesthesia Complications Negative for: history of anesthetic complications  Airway Mallampati: I  TM Distance: >3 FB Neck ROM: Full    Dental  (+) Dental Advisory Given, Missing, Poor Dentition   Pulmonary shortness of breath, sleep apnea and Continuous Positive Airway Pressure Ventilation , COPD, former smoker,  pulm nodule   breath sounds clear to auscultation       Cardiovascular hypertension, Pt. on medications and Pt. on home beta blockers + CAD, + Past MI and + Cardiac Stents  + dysrhythmias (s/p ablation) Supra Ventricular Tachycardia  Rhythm:Regular Rate:Normal  1/18 ECHO: EF 40-45%, valves OK   Neuro/Psych Depression    GI/Hepatic Bowel prep,GERD  Controlled,Elevated LFTs   Endo/Other  diabetes (glu 86)Hyperthyroidism   Renal/GU ESRF and DialysisRenal disease ([facility])   Prostate cancer    Musculoskeletal  (+) Arthritis ,   Abdominal (+) + obese,   Peds  Hematology Hb 8.5   Anesthesia Other Findings   Reproductive/Obstetrics                            Anesthesia Physical Anesthesia Plan  ASA: III  Anesthesia Plan: MAC   Post-op Pain Management:    Induction: Intravenous  PONV Risk Score and Plan: 1 and Ondansetron and Treatment may vary due to age or medical condition  Airway Management Planned: Natural Airway and Nasal Cannula  Additional Equipment:   Intra-op Plan:   Post-operative Plan:   Informed Consent: I have reviewed the patients History and Physical, chart, labs and discussed the procedure including the risks, benefits and alternatives for the proposed anesthesia with the patient or authorized representative who  has indicated his/her understanding and acceptance.   Dental advisory given  Plan Discussed with: CRNA and Surgeon  Anesthesia Plan Comments: (Plan routine monitors, MAC)        Anesthesia Quick Evaluation

## 2017-05-20 NOTE — Progress Notes (Signed)
Pt placed himself on home CPAP. RT will continue to monitor.

## 2017-05-20 NOTE — Progress Notes (Signed)
[facility] Progress Note  Subjective: feeling better, no /co, had HD last night w/ 2L off.  BP's still on the high side.   Vitals:   05/20/17 0937 05/20/17 1000 05/20/17 1003 05/20/17 1100  BP: (!) 170/46  127/63   Pulse: 75 75 74 67  Resp:  15 (!) 23 (!) 0  Temp:      TempSrc:      SpO2:  97% 95% 97%  Weight:      Height:        Inpatient medications: . allopurinol  100 mg Oral Daily  . chlorhexidine  15 mL Mouth Rinse BID  . cinacalcet  90 mg Oral BID WC  . citalopram  10 mg Oral Daily  . colesevelam  1,875 mg Oral BID WC  . darbepoetin (ARANESP) injection - DIALYSIS  200 mcg Intravenous Q Fri-HD  . doxercalciferol  6 mcg Intravenous Q T,Th,Sa-HD  . ezetimibe  10 mg Oral Daily  . gabapentin  300 mg Oral QHS  . insulin aspart  0-5 Units Subcutaneous QHS  . insulin aspart  0-9 Units Subcutaneous TID WC  . lanthanum  1,000 mg Oral TID WC  . mouth rinse  15 mL Mouth Rinse q12n4p  . methimazole  5 mg Oral Daily  . metoprolol tartrate  25 mg Oral 2 times per day on Sun Mon Wed Fri  . midodrine  10 mg Oral Q T,Th,Sat-1800  . multivitamin  1 tablet Oral QHS  . pantoprazole  40 mg Oral QPM   . sodium chloride    . sodium chloride     sodium chloride, sodium chloride, acetaminophen, lidocaine (PF), lidocaine-prilocaine, naphazoline-glycerin, pentafluoroprop-tetrafluoroeth, polyvinyl alcohol  Exam: Alert, calm, NAD No jvd Chest clear bilat RRR no mrg Ext no edema RUA AVF+bruit No edema NF , Ox 3   Dialysis: SW TTS 4h   BFR 450/800 2K/2Ca   EDW 105.5kg  R AVF No heparin [name] 6 mcg IV q HD Mircera 75 mcg IV q 4 weeks (dosed 7/31, last OP Hgb 10.0 on 8/9)       Impression: 1. GI Bleed/Hematochezia - No further bleeding episodes. Nuclear bleeding scan neg.   For RFA procedure tomorrow per GI.            2. ESRD -  TTS - some cannulation issues, use smaller needles. HD tomorrow.  3. Hypertension/volume  - BP stable, midodrine on OP med list/ Plan UF  to EDW  4. Anemia  - As above. Resume ESA with HD today. Transfuse prn 5. Metabolic bone disease -  Cont VDRA/Fosrenol binder  6. Nutrition - on CL, renal diet when resumes eating  7. CAD/ICM/STEMI - holding Plavix with GI bleed   Plan - as above   Kelly Splinter MD [facility] pager [phone]   05/20/2017, 12:40 PM    Recent Labs Lab 05/17/17 0715 05/18/17 0213 05/19/17 1913  NA 144 139 138  K 4.9 4.0 4.1  CL 109 101 100*  CO2 23 26 26   GLUCOSE 148* 92 109*  BUN 62* 29* 50*  CREATININE 11.59* 7.39* 11.26*  CALCIUM 8.0* 7.5* 8.7*  PHOS 5.3* 3.9 7.2*    Recent Labs Lab 05/18/17 0213 05/19/17 1913  ALBUMIN 2.9* 2.9*    Recent Labs Lab 05/19/17 0936 05/19/17 2320 05/20/17 0217  WBC 6.3 8.4 6.4  HGB 7.7* 9.4* 8.9*  HCT 23.9* 28.4* 27.5*  MCV 90.5 89.3 89.9  PLT 226 213 226   Iron/TIBC/Ferritin/ %Sat  Component Value Date/Time   IRON 42 10/24/2016 1611   TIBC 246 (L) 10/17/2016 1103   FERRITIN 709 (H) 10/17/2016 1103   IRONPCTSAT 14.9 (L) 10/24/2016 1611

## 2017-05-21 ENCOUNTER — Encounter (HOSPITAL_COMMUNITY): Payer: Self-pay | Admitting: *Deleted

## 2017-05-21 ENCOUNTER — Inpatient Hospital Stay (HOSPITAL_COMMUNITY): Payer: Medicare Other | Admitting: Certified Registered Nurse Anesthetist

## 2017-05-21 ENCOUNTER — Encounter (HOSPITAL_COMMUNITY)
Admission: EM | Disposition: A | Discharge status: Home Health | Payer: Self-pay | Source: Home / Self Care | Attending: Internal Medicine

## 2017-05-21 DIAGNOSIS — R42 Dizziness and giddiness: Secondary | ICD-10-CM

## 2017-05-21 HISTORY — PX: FLEXIBLE SIGMOIDOSCOPY: SHX5431

## 2017-05-21 LAB — CBC
HEMATOCRIT: 26.8 % — AB (ref 39.0–52.0)
Hemoglobin: 8.5 g/dL — ABNORMAL LOW (ref 13.0–17.0)
MCH: 29 pg (ref 26.0–34.0)
MCHC: 31.7 g/dL (ref 30.0–36.0)
MCV: 91.5 fL (ref 78.0–100.0)
Platelets: 248 10*3/uL (ref 150–400)
RBC: 2.93 MIL/uL — AB (ref 4.22–5.81)
RDW: 16.8 % — AB (ref 11.5–15.5)
WBC: 7.2 10*3/uL (ref 4.0–10.5)

## 2017-05-21 LAB — POCT I-STAT, CHEM 8
BUN: 34 mg/dL — ABNORMAL HIGH (ref 6–20)
Calcium, Ion: 1.11 mmol/L — ABNORMAL LOW (ref 1.15–1.40)
Chloride: 99 mmol/L — ABNORMAL LOW (ref 101–111)
Creatinine, Ser: 9.9 mg/dL — ABNORMAL HIGH (ref 0.61–1.24)
Glucose, Bld: 89 mg/dL (ref 65–99)
HCT: 27 % — ABNORMAL LOW (ref 39.0–52.0)
Hemoglobin: 9.2 g/dL — ABNORMAL LOW (ref 13.0–17.0)
Potassium: 4.1 mmol/L (ref 3.5–5.1)
Sodium: 138 mmol/L (ref 135–145)
TCO2: 27 mmol/L (ref 0–100)

## 2017-05-21 LAB — GLUCOSE, CAPILLARY
Glucose-Capillary: 86 mg/dL (ref 65–99)
Glucose-Capillary: 87 mg/dL (ref 65–99)
Glucose-Capillary: 84 mg/dL (ref 65–99)
Glucose-Capillary: 106 mg/dL — ABNORMAL HIGH (ref 65–99)

## 2017-05-21 LAB — HEPATITIS B SURFACE ANTIGEN: Hepatitis B Surface Ag: NEGATIVE

## 2017-05-21 SURGERY — SIGMOIDOSCOPY, FLEXIBLE
Anesthesia: Monitor Anesthesia Care

## 2017-05-21 MED ORDER — DOXERCALCIFEROL 4 MCG/2ML IV SOLN
INTRAVENOUS | Status: AC
  Filled 2017-05-21: qty 4

## 2017-05-21 MED ORDER — PROMETHAZINE HCL 25 MG/ML IJ SOLN: 6.2500 mg | INTRAMUSCULAR | Status: DC | PRN

## 2017-05-21 MED ORDER — ONDANSETRON HCL 4 MG/2ML IJ SOLN
INTRAMUSCULAR | Status: DC | PRN
  Administered 2017-05-21: 4 mg via INTRAVENOUS

## 2017-05-21 MED ORDER — MIDAZOLAM HCL 2 MG/2ML IJ SOLN: 0.5000 mg | Freq: Once | INTRAMUSCULAR | Status: DC | PRN

## 2017-05-21 MED ORDER — EPHEDRINE SULFATE 50 MG/ML IJ SOLN
INTRAMUSCULAR | Status: DC | PRN
  Administered 2017-05-21: 15 mg via INTRAVENOUS
  Administered 2017-05-21: 10 mg via INTRAVENOUS

## 2017-05-21 MED ORDER — MEPERIDINE HCL 25 MG/ML IJ SOLN: 6.2500 mg | INTRAMUSCULAR | Status: DC | PRN

## 2017-05-21 MED ORDER — PHENYLEPHRINE HCL 10 MG/ML IJ SOLN
INTRAMUSCULAR | Status: DC | PRN
  Administered 2017-05-21 (×6): 80 ug via INTRAVENOUS
  Administered 2017-05-21: 160 ug via INTRAVENOUS

## 2017-05-21 MED ORDER — PROPOFOL 500 MG/50ML IV EMUL
INTRAVENOUS | Status: DC | PRN
  Administered 2017-05-21: 125 ug/kg/min via INTRAVENOUS

## 2017-05-21 MED ORDER — PROPOFOL 10 MG/ML IV BOLUS
INTRAVENOUS | Status: DC | PRN
  Administered 2017-05-21: 25 mg via INTRAVENOUS
  Administered 2017-05-21 (×2): 15 mg via INTRAVENOUS

## 2017-05-21 MED ORDER — LIDOCAINE HCL (CARDIAC) 20 MG/ML IV SOLN
INTRAVENOUS | Status: DC | PRN
  Administered 2017-05-21: 60 mg via INTRATRACHEAL

## 2017-05-21 NOTE — Progress Notes (Signed)
Patient going to Endo for Flexible Sigmoidoscopy. Tap enema administered. No complaints at this time.

## 2017-05-21 NOTE — Care Management Important Message (Signed)
Important Message  Patient Details  Name: [name] MRN: [id number] Date of Birth: 1950-10-29   Medicare Important Message Given:  Yes    Orbie Pyo 05/21/2017, 12:27 PM

## 2017-05-21 NOTE — Transfer of Care (Signed)
Immediate Anesthesia Transfer of Care Note  Patient: [name]  Procedure(s) Performed: Procedure(s): FLEXIBLE SIGMOIDOSCOPY-BARRX (N/A)  Patient Location: PACU  Anesthesia Type:MAC  Level of Consciousness: awake, alert  and patient cooperative  Airway & Oxygen Therapy: Patient Spontanous Breathing and Patient connected to face mask oxygen  Post-op Assessment: Report given to RN and Post -op Vital signs reviewed and stable  Post vital signs: Reviewed and stable  Last Vitals:  Vitals:   05/21/17 0839 05/21/17 0945  BP: (!) 154/76 124/65  Pulse: 70 78  Resp: 18 16  Temp: 36.8 C 36.6 C  SpO2: 95% 100%    Last Pain:  Vitals:   05/21/17 0945  TempSrc: Oral  PainSc:          Complications: No apparent anesthesia complications

## 2017-05-21 NOTE — Progress Notes (Signed)
[facility] Progress Note  Subjective: has RFA today for rad proctitis.  On HD now.   Vitals:   05/21/17 1345 05/21/17 1400 05/21/17 1430 05/21/17 1500  BP: 125/66 124/64 122/65 117/88  Pulse: 69 68 68 67  Resp:      Temp:      TempSrc:      SpO2:      Weight:      Height:        Inpatient medications: . allopurinol  100 mg Oral Daily  . chlorhexidine  15 mL Mouth Rinse BID  . cinacalcet  90 mg Oral BID WC  . citalopram  10 mg Oral Daily  . colesevelam  1,875 mg Oral BID WC  . darbepoetin (ARANESP) injection - DIALYSIS  200 mcg Intravenous Q Fri-HD  . doxercalciferol  6 mcg Intravenous Q T,Th,Sa-HD  . ezetimibe  10 mg Oral Daily  . gabapentin  600 mg Oral QHS  . insulin aspart  0-5 Units Subcutaneous QHS  . insulin aspart  0-9 Units Subcutaneous TID WC  . lanthanum  1,000 mg Oral TID WC  . mouth rinse  15 mL Mouth Rinse q12n4p  . methimazole  5 mg Oral Daily  . metoprolol tartrate  25 mg Oral 2 times per day on Sun Mon Wed Fri  . midodrine  10 mg Oral Q T,Th,Sat-1800  . multivitamin  1 tablet Oral QHS  . pantoprazole  40 mg Oral QPM   . sodium chloride    . sodium chloride     sodium chloride, sodium chloride, acetaminophen, alteplase, heparin, lidocaine (PF), lidocaine-prilocaine, meperidine (DEMEROL) injection, midazolam, naphazoline-glycerin, pentafluoroprop-tetrafluoroeth, polyvinyl alcohol, promethazine  Exam: Alert, calm, NAD No jvd Chest clear bilat RRR no mrg Ext no edema RUA AVF+bruit No edema NF , Ox 3   Dialysis: SW TTS 4h   BFR 450/800 2K/2Ca   EDW 105.5kg  R AVF No heparin [name] 6 mcg IV q HD Mircera 75 mcg IV q 4 weeks (dosed 7/31, last OP Hgb 10.0 on 8/9)       Impression: 1. GI Bleed/Hematochezia - No further bleeding episodes. Nuclear bleeding scan neg. Underwent RFA rx for radiation proctitis this am.  Will f/u w GI in op setting.  2. ESRD -  TTS - some cannulation issues, use smaller needles. HD today, is on now.   3. Hypertension/volume  - BP stable, midodrine on OP med list 4. Anemia  - As above. Resume ESA with HD yest. Transfuse prn 5. Metabolic bone disease -  Cont VDRA/Fosrenol binder  6. Nutrition - on CL, renal diet when resumes eating  7. CAD/ICM/STEMI - plavix on hold for GIB  Plan - as above   Kelly Splinter MD [facility] pager [phone]   05/21/2017, 3:07 PM    Recent Labs Lab 05/17/17 0715 05/18/17 0213 05/19/17 1913 05/21/17 0859  NA 144 139 138 138  K 4.9 4.0 4.1 4.1  CL 109 101 100* 99*  CO2 23 26 26   --   GLUCOSE 148* 92 109* 89  BUN 62* 29* 50* 34*  CREATININE 11.59* 7.39* 11.26* 9.90*  CALCIUM 8.0* 7.5* 8.7*  --   PHOS 5.3* 3.9 7.2*  --     Recent Labs Lab 05/18/17 0213 05/19/17 1913  ALBUMIN 2.9* 2.9*    Recent Labs Lab 05/20/17 0217 05/20/17 1831 05/21/17 0537 05/21/17 0859  WBC 6.4 7.6 7.2  --   HGB 8.9* 9.6* 8.5* 9.2*  HCT 27.5* 29.7* 26.8* 27.0*  MCV 89.9 92.2 91.5  --   PLT 226 266 248  --    Iron/TIBC/Ferritin/ %Sat    Component Value Date/Time   IRON 42 10/24/2016 1611   TIBC 246 (L) 10/17/2016 1103   FERRITIN 709 (H) 10/17/2016 1103   IRONPCTSAT 14.9 (L) 10/24/2016 1611

## 2017-05-21 NOTE — Progress Notes (Signed)
PROGRESS NOTE    [name]  UUV:[id number] DOB: [birth year] DOA: 05/16/2017 PCP: Biagio Borg, MD   Brief Narrative: 66yo M with Hx of ESRD on TTS HD, CAD s/p PCI on plavix and ASA, HTN, systolic CHF (EF 47-42% 5/95), DM2, HLD, and OSA on CPAP who presented to the ER with the acute onset of painless rectal bleeding.  Patient has a history of chronic GI bleeding followed by [facility] with recent hospitalization for same on 7/23- 7/28. Patient underwent colonoscopy 7/27 which showed a small polyp in the ascending colon which was removed, diverticulosis of entire colon, internal hemorrhoids, and petechiae in rectum questionably radiation proctitis treated with argon plasma coagulation. In the ER the patient experienced BRBPR with large clots x4, and had a syncopal episode w/ transient hypotension. He was treated with emergency release 1 unit PRBC and platelets, and DDAVP. Blood pressure stabilized.  GI and IR were consulted and patient went for a tagged RBC study.  Assessment & Plan:   # Acute on chronic rectal bleeding: -Patient just came back from flex sigmoidoscopy. Few colonic angioectasias was found which was treated with radiofrequency ablation. Also found to have a single ulcer in the distal rectum. GI consulted appreciated. Continue to monitor.  #Hypotension and syncope on admission due to acute blood loss: Blood pressure improved. Patient still feels lightheadedness today likely contributed by the anesthesia during procedure. PT/OT evaluation. Blood pressure improved.  #Acute blood loss anemia on chronic anemia of chronic kidney disease: Received red blood cell transfusion. Hemoglobin is stable. No active bleeding. Monitor CBC.  #Chronic systolic congestive heart failure: Volume management during dialysis. Low-salt diet advised.  #History of coronary artery disease status post PCI on chronic Plavix: Currently on hold. Likely able to resume in a week as per GI. He has no chest pain or  shortness of breath.  #ESRD on hemodialysis: Plan for hemodialysis treatment today.  #OSA on chronic CPAP  #Type 2 diabetes: Monitor blood sugar level.  #Hyperthyroidism, depression, chronic pain: Continue current medication.  PT with evaluation. Patient reported mild lightheadedness. Patient's wife very hesitant to take him home today and wanted to observe today. She reported that she already discussed with GI and agreed with the plan. Ordered PT/OT eval.  DVT prophylaxis:SCD Code Status: Full code Family Communication: Discussed with the patient's wife at bedside Disposition Plan: Likely discharge home tomorrow    Consultants:   GI  Procedures: Sigmoidoscopy Antimicrobials: None  Subjective: Seen and examined at bedside. Patient denied headache,  nausea vomiting chest pain shortness of breath. He reported mild lightheadedness and weakness. Objective: Vitals:   05/21/17 0945 05/21/17 0950 05/21/17 1000 05/21/17 1010  BP: 124/65 (!) 131/54 (!) 131/49 (!) 146/52  Pulse: 78 77 76 73  Resp: 16 19 19 13   Temp: 97.9 F (36.6 C)     TempSrc: Oral     SpO2: 100% 98% 99% 99%  Weight:      Height:        Intake/Output Summary (Last 24 hours) at 05/21/17 1319 Last data filed at 05/21/17 1215  Gross per 24 hour  Intake              480 ml  Output               10 ml  Net              470 ml   Filed Weights   05/19/17 1825 05/19/17 2235 05/20/17 0440  Weight: 105.9  kg (233 lb 7.5 oz) 103.9 kg (229 lb 0.9 oz) 103.9 kg (229 lb 0.9 oz)    Examination:  General exam: Appears calm and comfortable  Respiratory system: Clear to auscultation. Respiratory effort normal. No wheezing or crackle Cardiovascular system: S1 & S2 heard, RRR.  No pedal edema. Gastrointestinal system: Abdomen is nondistended, soft and nontender. Normal bowel sounds heard. Central nervous system: Alert and oriented. No focal neurological deficits. Extremities: Symmetric 5 x 5 power. Skin: No rashes,  lesions or ulcers Psychiatry: Judgement and insight appear normal. Mood & affect appropriate.     Data Reviewed: I have personally reviewed following labs and imaging studies  CBC:  Recent Labs Lab 05/19/17 0936 05/19/17 2320 05/20/17 0217 05/20/17 1831 05/21/17 0537 05/21/17 0859  WBC 6.3 8.4 6.4 7.6 7.2  --   HGB 7.7* 9.4* 8.9* 9.6* 8.5* 9.2*  HCT 23.9* 28.4* 27.5* 29.7* 26.8* 27.0*  MCV 90.5 89.3 89.9 92.2 91.5  --   PLT 226 213 226 266 248  --    Basic Metabolic Panel:  Recent Labs Lab 05/16/17 2115 05/17/17 0715 05/18/17 0213 05/19/17 1913 05/21/17 0859  NA 143 144 139 138 138  K 4.6 4.9 4.0 4.1 4.1  CL 105 109 101 100* 99*  CO2 24 23 26 26   --   GLUCOSE 145* 148* 92 109* 89  BUN 58* 62* 29* 50* 34*  CREATININE 11.42* 11.59* 7.39* 11.26* 9.90*  CALCIUM 8.8* 8.0* 7.5* 8.7*  --   MG  --  2.1  --   --   --   PHOS  --  5.3* 3.9 7.2*  --    GFR: Estimated Creatinine Clearance: 9 mL/min (A) (by C-G formula based on SCr of 9.9 mg/dL (H)). Liver Function Tests:  Recent Labs Lab 05/18/17 0213 05/19/17 1913  ALBUMIN 2.9* 2.9*   No results for input(s): LIPASE, AMYLASE in the last 168 hours. No results for input(s): AMMONIA in the last 168 hours. Coagulation Profile: No results for input(s): INR, PROTIME in the last 168 hours. Cardiac Enzymes: No results for input(s): CKTOTAL, CKMB, CKMBINDEX, TROPONINI in the last 168 hours. BNP (last 3 results) No results for input(s): PROBNP in the last 8760 hours. HbA1C:  Recent Labs  05/20/17 0217  HGBA1C 5.7*   CBG:  Recent Labs Lab 05/20/17 1706 05/20/17 1753 05/20/17 2124 05/21/17 0824 05/21/17 1149  GLUCAP 118* 129* 125* 86 87   Lipid Profile: No results for input(s): CHOL, HDL, LDLCALC, TRIG, CHOLHDL, LDLDIRECT in the last 72 hours. Thyroid Function Tests: No results for input(s): TSH, T4TOTAL, FREET4, T3FREE, THYROIDAB in the last 72 hours. Anemia Panel: No results for input(s): VITAMINB12,  FOLATE, FERRITIN, TIBC, IRON, RETICCTPCT in the last 72 hours. Sepsis Labs:  Recent Labs Lab 05/16/17 2130  LATICACIDVEN 1.54    Recent Results (from the past 240 hour(s))  MRSA PCR Screening     Status: None   Collection Time: 05/17/17  1:51 AM  Result Value Ref Range Status   MRSA by PCR NEGATIVE NEGATIVE Final    Comment:        The GeneXpert MRSA Assay (FDA approved for NASAL specimens only), is one component of a comprehensive MRSA colonization surveillance program. It is not intended to diagnose MRSA infection nor to guide or monitor treatment for MRSA infections.          Radiology Studies: No results found.      Scheduled Meds: . allopurinol  100 mg Oral Daily  . chlorhexidine  15 mL Mouth Rinse BID  . cinacalcet  90 mg Oral BID WC  . citalopram  10 mg Oral Daily  . colesevelam  1,875 mg Oral BID WC  . darbepoetin (ARANESP) injection - DIALYSIS  200 mcg Intravenous Q Fri-HD  . doxercalciferol  6 mcg Intravenous Q T,Th,Sa-HD  . ezetimibe  10 mg Oral Daily  . gabapentin  600 mg Oral QHS  . insulin aspart  0-5 Units Subcutaneous QHS  . insulin aspart  0-9 Units Subcutaneous TID WC  . lanthanum  1,000 mg Oral TID WC  . mouth rinse  15 mL Mouth Rinse q12n4p  . methimazole  5 mg Oral Daily  . metoprolol tartrate  25 mg Oral 2 times per day on Sun Mon Wed Fri  . midodrine  10 mg Oral Q T,Th,Sat-1800  . multivitamin  1 tablet Oral QHS  . pantoprazole  40 mg Oral QPM   Continuous Infusions: . sodium chloride    . sodium chloride       LOS: 4 days    Alexsa Flaum Tanna Furry, MD [facility] Pager [phone]  If 7PM-7AM, please contact night-coverage [email or web address] Password [email or web address] 05/21/2017, 1:19 PM

## 2017-05-21 NOTE — Anesthesia Procedure Notes (Signed)
Procedure Name: MAC Date/Time: 05/21/2017 9:06 AM Performed by: Salli Quarry Devanta Daniel Pre-anesthesia Checklist: Patient identified, Emergency Drugs available, Suction available and Patient being monitored Patient Re-evaluated:Patient Re-evaluated prior to induction Oxygen Delivery Method: Simple face mask

## 2017-05-21 NOTE — Progress Notes (Signed)
Patient back from Endo. Alert and oriented x 4, no acute distress noted, no complaints. Will continue to monitor.

## 2017-05-21 NOTE — Progress Notes (Signed)
Pt has home cpap machine and self administers. Pt advised to call if needed anything.

## 2017-05-21 NOTE — Anesthesia Postprocedure Evaluation (Signed)
Anesthesia Post Note  Patient: [name]  Procedure(s) Performed: Procedure(s) (LRB): FLEXIBLE SIGMOIDOSCOPY-BARRX (N/A)     Patient location during evaluation: Endoscopy Anesthesia Type: MAC Level of consciousness: awake and alert, oriented and patient cooperative Pain management: pain level controlled Vital Signs Assessment: post-procedure vital signs reviewed and stable Respiratory status: spontaneous breathing, nonlabored ventilation, respiratory function stable and patient connected to nasal cannula oxygen Cardiovascular status: blood pressure returned to baseline and stable Postop Assessment: no signs of nausea or vomiting Anesthetic complications: no    Last Vitals:  Vitals:   05/21/17 1000 05/21/17 1010  BP: (!) 131/49 (!) 146/52  Pulse: 76 73  Resp: 19 13  Temp:    SpO2: 99% 99%    Last Pain:  Vitals:   05/21/17 0945  TempSrc: Oral  PainSc:                  Kyarah Enamorado,E. Deklan Minar

## 2017-05-21 NOTE — Progress Notes (Signed)
05/21/2017- Pt has home machine which he uses.

## 2017-05-21 NOTE — Op Note (Signed)
[facility] Patient Name: [name] Procedure Date : 05/21/2017 MRN: [id number] Attending MD: Clarene Essex , MD Date of Birth: 1951/08/24 CSN: [id number] Age: 66 Admit Type: Outpatient Procedure:                Flexible Sigmoidoscopy Indications:              Rectal hemorrhage history of radiation proctitis                            with treatment Providers:                Clarene Essex, MD, Truddie Coco, RN Referring MD:              Medicines:                Propofol total dose 400 mg IV, Ondansetron 4 mg                            IV,60 mg IV lidocaine Complications:            No immediate complications. Estimated Blood Loss:     Estimated blood loss: none. Procedure:                Pre-Anesthesia Assessment:                           - Prior to the procedure, a History and Physical                            was performed, and patient medications and                            allergies were reviewed. The patient's tolerance of                            previous anesthesia was also reviewed. The risks                            and benefits of the procedure and the sedation                            options and risks were discussed with the patient.                            All questions were answered, and informed consent                            was obtained. Prior Anticoagulants: The patient has                            taken Plavix (clopidogrel), last dose was 5 days                            prior to procedure. ASA Grade Assessment: III - A  patient with severe systemic disease. After                            reviewing the risks and benefits, the patient was                            deemed in satisfactory condition to undergo the                            procedure.                           After obtaining informed consent, the scope was                            passed under direct vision. The [id number] ([id number])                    scope was introduced through the anus and advanced                            to the the descending colon at 45 cm. The flexible                            sigmoidoscopy was accomplished without difficulty.                            The patient tolerated the procedure well. The                            quality of the bowel preparation was adequate. Scope In: 9:14:12 AM Scope Out: 9:39:31 AM Total Procedure Duration: 0 hours 25 minutes 19 seconds  Findings:      A few localized angioectasias with bleeding on contact were found in the       distal rectum. Focal radiofrequency ablation of radiation proctitiswas       performed. With the endoscope in place, the position and extent of the       radiation proctitis mucosa and the anatomic landmarks were noted. The       radiofrequency channel ablation catheter was introduced through the       endoscope working channel. rectal abnormal tissue was targeted. The       endoscope with the ablation catheter was advanced to the areas of       radiation proctitis. The endoscope with the channel ablation catheter       was positioned under direct visualization so that the catheter was       placed in contact with the surface of the rectum. Energy was applied.       The channel ablation catheter was then removed through the endoscope       working channel, and the ablation catheter was cleaned. The endoscope       was left in place. The ablation zone was cleaned of coagulative debris.       The ablation catheter was reinserted into the endoscope working channel.       a few more treatments were done and a total of 18 was done The ablation  catheter was removed through the endoscope working channel. The areas of       the ectum had been ablated were examined. The endoscope was then removed.      A large amount of solid stool was found in the distal descending colon       at 45 cm.      A single (solitary) ulcer was found in the  distal rectum. No bleeding       was present.      The exam was otherwise without abnormality.no sigmoid diverticuli were       seen on today's exam Impression:               - A few colonic angioectasias. Treated with                            radiofrequency ablation.                           - Stool in the distal descending colon.                           - A single (solitary) ulcer in the distal rectum.                           - The examination was otherwise normal.                           - No specimens collected. Moderate Sedation:      moderate sedation-none Recommendation:           - Resume regular diet.                           - Resume Plavix (clopidogrel) at prior dose in 1                            week secondary to previous bleeding however could                            start tomorrow if doing well based on therapy.                            would reevaluate his blood thinner needs using as                            low a dose as possible going forward                           - Return to GI clinic PRN.                           - Telephone GI clinic if symptomatic PRN. Procedure Code(s):        --- Professional ---                           [phone], Sigmoidoscopy, flexible; diagnostic,  including collection of specimen(s) by brushing or                            washing, when performed (separate procedure) Diagnosis Code(s):        --- Professional ---                           F41.42, Angiodysplasia of colon without hemorrhage                           K62.6, Ulcer of anus and rectum                           K62.5, Hemorrhage of anus and rectum CPT copyright 2016 [employer] Association. All rights reserved. The codes documented in this report are preliminary and upon coder review may  be revised to meet current compliance requirements. Clarene Essex, MD 05/21/2017 10:01:02 AM This report has been signed electronically. Number  of Addenda: 0

## 2017-05-21 NOTE — Progress Notes (Signed)
[name] 9:08 AM  Subjective: Patient doing well without signs of bleeding and he can we discussed his ER bleeding and diverticular bleeding versus radiation proctitis bleeding with he and his wife and we answered all of their questions and he has no new complaints  Objective: Vital signs stable afebrile no acute distress exam stable please see preassessment evaluation labs stable  Assessment: Probable diverticular bleeding in patient with radiation proctitis  Plan: We'll plan on treating radiation today if needed with flexible sigmoidoscopy with anesthesia assistance in effort to hopefully make a diagnosis in the future of his lower GI bleeding easier but the risks of recurrent diverticular bleeding was discussed as well as well as nuclear bleeding scan interventional radiology and surgery options [facility] E  Pager [phone] After 5PM or if no answer call [phone]

## 2017-05-22 ENCOUNTER — Telehealth: Payer: Self-pay | Admitting: Physical Therapy

## 2017-05-22 ENCOUNTER — Encounter (HOSPITAL_COMMUNITY): Payer: Self-pay | Admitting: Gastroenterology

## 2017-05-22 ENCOUNTER — Ambulatory Visit: Payer: Self-pay

## 2017-05-22 LAB — CBC
HEMATOCRIT: 28.1 % — AB (ref 39.0–52.0)
HEMOGLOBIN: 8.9 g/dL — AB (ref 13.0–17.0)
MCH: 29.2 pg (ref 26.0–34.0)
MCHC: 31.7 g/dL (ref 30.0–36.0)
MCV: 92.1 fL (ref 78.0–100.0)
Platelets: 276 10*3/uL (ref 150–400)
RBC: 3.05 MIL/uL — AB (ref 4.22–5.81)
RDW: 17.2 % — ABNORMAL HIGH (ref 11.5–15.5)
WBC: 7.2 10*3/uL (ref 4.0–10.5)

## 2017-05-22 LAB — GLUCOSE, CAPILLARY
Glucose-Capillary: 113 mg/dL — ABNORMAL HIGH (ref 65–99)
Glucose-Capillary: 148 mg/dL — ABNORMAL HIGH (ref 65–99)

## 2017-05-22 MED ORDER — POLYETHYLENE GLYCOL 3350 17 G PO PACK: 17.0000 g | PACK | Freq: Every day | ORAL | 0 refills | Status: DC

## 2017-05-22 MED ORDER — CLOPIDOGREL BISULFATE 75 MG PO TABS: ORAL_TABLET | ORAL | 0 refills | Status: DC

## 2017-05-22 MED ORDER — POLYETHYLENE GLYCOL 3350 17 G PO PACK
17.0000 g | PACK | Freq: Every day | ORAL | Status: DC
  Administered 2017-05-22: 17 g via ORAL
  Filled 2017-05-22: qty 1

## 2017-05-22 NOTE — Evaluation (Signed)
Occupational Therapy Evaluation and Defer to [facility] Patient Details Name: [name] MRN: [id number] DOB: Jan 29, 1951 Today's Date: 05/22/2017    History of Present Illness 66 year old male with extensive PMH significant for but not limited to ESRD on TTS HD, CAD, NSTEMI s/p PCI on plavix and ASA, HTN, systolic HF (EF 84-66% 5/99), DMT2, HLD, OSA on CPAP who presented to the ER with painless rectal bleeding since the evening before. GI Bleed/Hematochezia; s/p flex sigmoidoscopy. Few colonic angioectasias was found which was treated with radiofrequency ablation   Clinical Impression   PTA Pt independent in ADL and mobility with RW. Sometimes on days when he was very fatigued he would need help with LB dressing. Pt is currently at baseline for ADL. He shared that the toughest thing for him is that he feels weak and that he gets tired very quickly. OT reviewed the use of DME and AE to assist him with these tasks, and provided the energy conservation handout for him to help him achieve more meaningful activities during the day. OT is in the process of discharging, and so no more acute OT will be possible. He will require HHOT to maximize safety and independence in his home environment with a focus on fall prevention, and energy conservation. Thank you for the opportunity to serve this patient.     Follow Up Recommendations  Home health OT;Supervision/Assistance - 24 hour    Equipment Recommendations  3 in 1 bedside commode    Recommendations for Other Services       Precautions / Restrictions Precautions Precautions: Fall      Mobility Bed Mobility Overal bed mobility: Needs Assistance Bed Mobility: Supine to Sit     Supine to sit: Min assist     General bed mobility comments: Pt sitting OOB in recliner when OT entered  Transfers Overall transfer level: Needs assistance Equipment used: Rolling walker (2 wheeled) Transfers: Sit to/from Stand Sit to Stand: Min guard          General transfer comment: Heavy dependence on UEs to push up    Balance Overall balance assessment: Needs assistance Sitting-balance support: No upper extremity supported;Feet supported Sitting balance-Leahy Scale: Good     Standing balance support: Bilateral upper extremity supported;No upper extremity supported Standing balance-Leahy Scale: Fair Standing balance comment: able to perform dressing with sit <>stand transfers with static standing with no assist needed; use of RW for BUE support during static standing for orthostatics                           ADL either performed or assessed with clinical judgement   ADL Overall ADL's : At baseline                                       General ADL Comments: Pt reports being fatigued easily, Pt able to demonstrate stand pivot transfers and was able to get fully dressed with no physical assist needed (supervision for safety during this session) handout provided for energy conservation and Pt encouraged to use DME and AE that he already has at home. Pt would benefit from a long handle sponge.      Vision Patient Visual Report: No change from baseline Vision Assessment?: No apparent visual deficits     Perception     Praxis      Pertinent Vitals/Pain Pain  Assessment: No/denies pain     Hand Dominance Left   Extremity/Trunk Assessment Upper Extremity Assessment Upper Extremity Assessment: Generalized weakness   Lower Extremity Assessment Lower Extremity Assessment: Generalized weakness   Cervical / Trunk Assessment Cervical / Trunk Assessment: Kyphotic   Communication Communication Communication: No difficulties   Cognition Arousal/Alertness: Awake/alert Behavior During Therapy: WFL for tasks assessed/performed Overall Cognitive Status: Within Functional Limits for tasks assessed                                     General Comments  orthostatics completed during session - see  vital information; energy conservation handout provided to Pt     Exercises     Shoulder Instructions      Home Living Family/patient expects to be discharged to:: Private residence Living Arrangements: Spouse/significant other Available Help at Discharge: Family;Available 24 hours/day Type of Home: House Home Access: Stairs to enter [employer] of Steps: 3 Entrance Stairs-Rails: Right Home Layout: One level     Bathroom Shower/Tub: Occupational psychologist: Standard Bathroom Accessibility: Yes   Home Equipment: Environmental consultant - 2 wheels;Cane - single point;Shower seat;Adaptive equipment Adaptive Equipment: Sock aid;Reacher        Prior Functioning/Environment Level of Independence: Independent with assistive device(s)  Gait / Transfers Assistance Needed: typically with RW              OT Problem List: Decreased strength;Decreased activity tolerance;Impaired balance (sitting and/or standing);Decreased safety awareness;Decreased knowledge of use of DME or AE;Obesity;Pain      OT Treatment/Interventions:      OT Goals(Current goals can be found in the care plan section) Acute Rehab OT Goals Patient Stated Goal: get stronger OT Goal Formulation: With patient Time For Goal Achievement: 06/05/17 Potential to Achieve Goals: Good  OT Frequency:     Barriers to D/C:            Co-evaluation              AM-PAC PT "6 Clicks" Daily Activity     Outcome Measure Help from another person eating meals?: None Help from another person taking care of personal grooming?: A Little Help from another person toileting, which includes using toliet, bedpan, or urinal?: A Little Help from another person bathing (including washing, rinsing, drying)?: A Little Help from another person to put on and taking off regular upper body clothing?: None Help from another person to put on and taking off regular lower body clothing?: None 6 Click Score: 21   End of Session  Equipment Utilized During Treatment: Rolling walker Nurse Communication: Mobility status;Patient requests pain meds;Other (comment) (Pt requests eye drops and adhesive removal wipes)  Activity Tolerance: Patient limited by fatigue Patient left: in chair;with call bell/phone within reach;with family/visitor present  OT Visit Diagnosis: Unsteadiness on feet (R26.81);Muscle weakness (generalized) (M62.81)                Time: 4268-3419 OT Time Calculation (min): 24 min Charges:  OT General Charges $OT Visit: 1 Procedure OT Evaluation $OT Eval Moderate Complexity: 1 Procedure OT Treatments $Self Care/Home Management : 8-22 mins G-Codes:     Hulda Humphrey OTR/L [place] 05/22/2017, 3:03 PM

## 2017-05-22 NOTE — Progress Notes (Signed)
[name] 10:47 AM  Subjective: Patient doing well without any complications or problems from his procedure and we discussed his bowels and he will use MiraLAX at home and he might go by our office and pick up samples of [place] suppositories and we discussed probably restarting his Plavix in 1 week because of admission bleeding and not the procedure yesterday and we answered all of his questions  Objective: Vital signs stable afebrile no acute distress abdomen is soft nontender hemoglobin stable  Assessment: Radiation proctitis status post RFA and resolved GI bleeding secondary to diverticuli or significant rectal ulcer from previous APC  Plan: Okay with me to go home see above for recommendations and will call us when necessary otherwise keep follow-up next week with either me or Dr. Paulita Fujita  [facility] E  Pager [phone] After 5PM or if no answer call [phone]

## 2017-05-22 NOTE — Progress Notes (Signed)
Patient discharge teaching given, including activity, diet, follow-up appoints, and medications. Patient verbalized understanding of all discharge instructions. IV access was d/c'd. Vitals are stable. Skin is intact except as charted in most recent assessments. Discharge delayed due to MD needing PT consult and Home Health arranged.  Pt to be escorted out by NT, to be driven home by family.

## 2017-05-22 NOTE — Evaluation (Signed)
Physical Therapy Evaluation Patient Details Name: [name] MRN: [id number] DOB: [birth year] Today's Date: 05/22/2017   History of Present Illness  66 year old male with extensive PMH significant for but not limited to ESRD on TTS HD, CAD, NSTEMI s/p PCI on plavix and ASA, HTN, systolic HF (EF 80-99% 8/33), DMT2, HLD, OSA on CPAP who presented to the ER with painless rectal bleeding since the evening before. GI Bleed/Hematochezia; s/p flex sigmoidoscopy. Few colonic angioectasias was found which was treated with radiofrequency ablation  Clinical Impression   Pt admitted with above diagnosis. Pt currently with functional limitations due to the deficits listed below (see PT Problem List). Prior to admission, used cane prn for amb; currently dependent on bilateral UE support for steadiness with walking; Agreeable to HHPT follow up;  Pt will benefit from skilled PT to increase their independence and safety with mobility to allow discharge to the venue listed below.       Follow Up Recommendations Home health PT;Supervision/Assistance - 24 hour    Equipment Recommendations  None recommended by PT    Recommendations for Other Services       Precautions / Restrictions Precautions Precautions: Fall Restrictions Weight Bearing Restrictions: No      Mobility  Bed Mobility Overal bed mobility: Needs Assistance Bed Mobility: Supine to Sit     Supine to sit: Min assist     General bed mobility comments: min handheld assist to pull to sit  Transfers Overall transfer level: Needs assistance Equipment used: Rolling walker (2 wheeled) Transfers: Sit to/from Stand Sit to Stand: Min guard         General transfer comment: Heavy dependence on UEs to push up  Ambulation/Gait Ambulation/Gait assistance: Min guard Ambulation Distance (Feet):  (pivotal steps bed to chair) Assistive device: Rolling walker (2 wheeled)       General Gait Details: Dependent on UEs for support;  declined further amb  Stairs            Wheelchair Mobility    Modified Rankin (Stroke Patients Only)       Balance Overall balance assessment: Needs assistance   Sitting balance-Leahy Scale: Good       Standing balance-Leahy Scale: Fair                               Pertinent Vitals/Pain Pain Assessment: No/denies pain    Home Living Family/patient expects to be discharged to:: Private residence Living Arrangements: Spouse/significant other Available Help at Discharge: Family;Available 24 hours/day Type of Home: House Home Access: Stairs to enter Entrance Stairs-Rails: Right Entrance Stairs-Number of Steps: 3 Home Layout: One level Home Equipment: Walker - 2 wheels;Cane - single point      Prior Function Level of Independence: Independent with assistive device(s)   Gait / Transfers Assistance Needed: typically with RW           Hand Dominance   Dominant Hand: Left    Extremity/Trunk Assessment   Upper Extremity Assessment Upper Extremity Assessment: Generalized weakness    Lower Extremity Assessment Lower Extremity Assessment: Generalized weakness       Communication   Communication: No difficulties  Cognition Arousal/Alertness: Awake/alert Behavior During Therapy: WFL for tasks assessed/performed Overall Cognitive Status: Within Functional Limits for tasks assessed  General Comments      Exercises     Assessment/Plan    PT Assessment Patient needs continued PT services  PT Problem List Decreased strength;Decreased activity tolerance;Decreased balance;Decreased mobility;Decreased knowledge of use of DME       PT Treatment Interventions DME instruction;Gait training;Stair training;Functional mobility training;Therapeutic activities;Therapeutic exercise;Patient/family education    PT Goals (Current goals can be found in the Care Plan section)  Acute Rehab PT  Goals Patient Stated Goal: get stronger PT Goal Formulation: With patient Time For Goal Achievement: 05/29/17 Potential to Achieve Goals: Good    Frequency Min 3X/week   Barriers to discharge        Co-evaluation               AM-PAC PT "6 Clicks" Daily Activity  Outcome Measure Difficulty turning over in bed (including adjusting bedclothes, sheets and blankets)?: None Difficulty moving from lying on back to sitting on the side of the bed? : A Little Difficulty sitting down on and standing up from a chair with arms (e.g., wheelchair, bedside commode, etc,.)?: A Little Help needed moving to and from a bed to chair (including a wheelchair)?: A Little Help needed walking in hospital room?: A Little Help needed climbing 3-5 steps with a railing? : A Little 6 Click Score: 19    End of Session Equipment Utilized During Treatment: Gait belt Activity Tolerance: Patient tolerated treatment well Patient left: in chair;with call bell/phone within reach Nurse Communication: Mobility status PT Visit Diagnosis: Unsteadiness on feet (R26.81);Muscle weakness (generalized) (M62.81)    Time: 6767-2094 PT Time Calculation (min) (ACUTE ONLY): 27 min   Charges:   PT Evaluation $PT Eval Moderate Complexity: 1 Mod PT Treatments $Therapeutic Activity: 8-22 mins   PT G Codes:        Roney Marion, PT  Acute Rehabilitation Services Pager [phone] Office [phone]   [place] 05/22/2017, 1:29 PM

## 2017-05-22 NOTE — Care Management Note (Signed)
Case Management Note  Patient Details  Name: [name] MRN: [id number] Date of Birth: 12-Jan-1951  Subjective/Objective:   Acute on chronic rectal bleed, Hypotension, CKD, ESRD on HD                 Action/Plan: Discharge Planning: AVS reviewed Spoke to pt and gave permission to speak to wife, [name]. Offered choice for [facility]. Wife states pt had [facility] in the past. He has RW and bedside commode at home. Contacted [facility] with new referral.   PCP Biagio Borg MD  Expected Discharge Date:  05/22/17               Expected Discharge Plan:  [place]  In-House Referral:  Clinical Social Work  Discharge planning Services  CM Consult  Post Acute Care Choice:  Home Health Choice offered to:  Spouse  DME Arranged:  N/A DME Agency:  NA  HH Arranged:  RN, PT, OT, Nurse's Aide [place] Agency:  [facility] (formerly [employer])  Status of Service:  Completed, signed off  If discussed at [employer], dates discussed:    Additional Comments:  Erenest Rasher, RN 05/22/2017, 12:29 PM

## 2017-05-22 NOTE — Discharge Summary (Signed)
Discharge Summary  [name] GXQ:[id number] DOB: [birth year]  PCP: Biagio Borg, MD  Admit date: 05/16/2017 Discharge date: 05/22/2017  Time spent: >65mins, more than 50% time spent on coordination of care  Recommendations for Outpatient Follow-up:  1. F/u with PMD within a week  for hospital discharge follow up, repeat cbc/bmp at follow up 2. F/u with [facility] 3. F/u with nephrology, continue dialysis 4. F/u with Dr pool for cervical and lumbar spine disease and left sided weakness  5. Continue follow with neurology as already scheduled  6. Home health arranged  Discharge Diagnoses:  Active Hospital Problems   Diagnosis Date Noted  . Lightheadedness   . Rectal bleeding   . Acute pulmonary edema ([name])   . Hematochezia 08/27/2016    Resolved Hospital Problems   Diagnosis Date Noted Date Resolved  No resolved problems to display.    Discharge Condition: stable  Diet recommendation: heart healthy/renal diet  Filed Weights   05/21/17 1315 05/21/17 1654 05/22/17 0654  Weight: 103.7 kg (228 lb 9.9 oz) 102.2 kg (225 lb 5 oz) 102.5 kg (225 lb 14.4 oz)    History of present illness: per critical care admit physician Dr Titus Mould CHIEF COMPLAINT:  Hematochezia   HISTORY OF PRESENT ILLNESS:   66 year old male with extensive PMH significant for but not limited to ESRD on TTS HD, CAD, NSTEMI s/p PCI on plavix and ASA, HTN, systolic HF (EF 14-48% 1/85), DMT2, HLD, OSA on CPAP who presented to the ER with painless rectal bleeding since 1900.    Additionally, patient has a history of chronic GI bleeding followed by [facility] with recent hospitalization for same on 7/23- 7/28.  Patient underwent colonoscopy 7/27 which showed a small polyp from ascending colon removed, diverticulosis of entire examined colon, petechiae in rectum, questionably radiation proctitis, internal hemorrhoids, and treated with argon plasma coagulation.  Rectal bleeding resolved and patient resumed on plavix  alone at discharge.  In ER, patient was cleaned four times for BRBPR with large clots. Patient had syncopal episode and transient hypotension. He was treated with emergency release 1 unit PRBC and platelets, and DDAVP.  Blood pressure stabilized GI and IR were consulted and patient to go for tagged RBC study to help identify the source of bleeding.  Hgb dropped to 9.1 from 10.5 from last admit.  Given symptomatic hematochezia, PCCM to admit for close monitoring.   Significant Events: 7/27 D/C after hospitalization for BRBPR 8/16 Admit w/ BRBPR 8/17 neg nuc med bleeding scan Transferred to [facility] on 8/20 S/p flex sig with radiofrequency ablation of colonic angioectasias on 8/21 by Dr [facility] Hospital Course:  Active Problems:   Hematochezia   Rectal bleeding   Acute pulmonary edema ([name])   Lightheadedness  # Acute on chronic rectal bleeding: -s/p  flex sigmoidoscopy. Few colonic angioectasias was found which was treated with radiofrequency ablation. Also found to have a single ulcer in the distal rectum. GI consulted appreciated. Continue to monitor. plavix held, he is to follow up with Gi in a week to decide plavix resumption.   #Hypotension and syncope on admission due to acute blood loss: Blood pressure improved. Patient still feels lightheadedness today likely contributed by the anesthesia during procedure. PT/OT evaluation. Blood pressure improved. orthostastic vital sign wnl.  #Acute blood loss anemia on chronic anemia of chronic kidney disease: Received red blood cell transfusion and platelet transfusion on admission. Hemoglobin is stable. No active bleeding. Monitor CBC.  #Chronic systolic congestive  heart failure: Volume management during dialysis. Low-salt diet advised.  #History of coronary artery disease status post PCI on chronic Plavix: Currently on hold. Likely able to resume in a week as per GI. He has no chest pain or shortness of breath.  #ESRD on  hemodialysis: nephrology consulted in the hospital for dialysis support, he is to continue outpatient HD as scheduled.  #OSA on chronic CPAP  #Type 2 diabetes: diet controlled, a1c 5.7 ,  Monitor blood sugar level.  #Hyperthyroidism, depression, chronic pain: Continue current medication.   FTT with progressive weakness,  Orthostatic vital signs unremarkable,  PT/OT eval, home health arranged.   Chronic left sided weakness, he has been followed by neurology for this, per outpatient neurology note, patient is to see neurosurgery Dr Annette Stable.   DVT prophylaxis:SCD Code Status: Full code Family Communication: patient Disposition Plan:  discharge home with home health     Consultants:  Critical care admit, tranfer for hospitalist service on 8/20  [facility]  Procedures: Sigmoidoscopy with radiofrequency ablation on 8/21.  Antimicrobials: None  Discharge Exam: BP 137/60 (BP Location: Left Arm)   Pulse 81   Temp 97.9 F (36.6 C) (Oral)   Resp 18   Ht 6\' 1"  (1.854 m)   Wt 102.5 kg (225 lb 14.4 oz)   SpO2 100%   BMI 29.80 kg/m   General: NAD Cardiovascular: RRR Respiratory: CTABL Extremity: no edema Neuro: left sided weakness, chronic at baseline  Discharge Instructions You were cared for by a hospitalist during your hospital stay. If you have any questions about your discharge medications or the care you received while you were in the hospital after you are discharged, you can call the unit and asked to speak with the hospitalist on call if the hospitalist that took care of you is not available. Once you are discharged, your primary care physician will handle any further medical issues. Please note that NO REFILLS for any discharge medications will be authorized once you are discharged, as it is imperative that you return to your primary care physician (or establish a relationship with a primary care physician if you do not have one) for your aftercare needs so that they  can reassess your need for medications and monitor your lab values.  Discharge Instructions    Diet - low sodium heart healthy    Complete by:  As directed    Renal diet/carb modified   Face-to-face encounter (required for Medicare/Medicaid patients)    Complete by:  As directed    I Mandy Fitzwater certify that this patient is under my care and that I, or a nurse practitioner or physician's assistant working with me, had a face-to-face encounter that meets the physician face-to-face encounter requirements with this patient on 05/22/2017. The encounter with the patient was in whole, or in part for the following medical condition(s) which is the primary reason for home health care (List medical condition): FTT   The encounter with the patient was in whole, or in part, for the following medical condition, which is the primary reason for home health care:  FTT   I certify that, based on my findings, the following services are medically necessary home health services:   Nursing Physical therapy     Reason for Medically Necessary Home Health Services:  Skilled Nursing- Change/Decline in Patient Status   My clinical findings support the need for the above services:  Unsafe ambulation due to balance issues   Further, I certify that my clinical findings support  that this patient is homebound due to:  Immunocompromised   Home Health    Complete by:  As directed    To provide the following care/treatments:   PT OT RN Social work     Increase activity slowly    Complete by:  As directed      Allergies as of 05/22/2017      Reactions   Cephalexin Swelling, Other (See Comments)   Tongue swelling   Statins Other (See Comments)   Weak muscles   Ciprofloxacin Rash      Medication List    TAKE these medications   acetaminophen 325 MG tablet Commonly known as:  TYLENOL Take 650 mg by mouth every 6 (six) hours as needed for mild pain or headache.   allopurinol 100 MG tablet Commonly known as:   ZYLOPRIM Take 1 tablet (100 mg total) by mouth daily.   citalopram 10 MG tablet Commonly known as:  CELEXA Take 10 mg by mouth daily as needed (anxiety).   clopidogrel 75 MG tablet Commonly known as:  PLAVIX Please hold plavix for a week, please see GI Dr Watt Climes in a week to discuss resuming plavix What changed:  See the new instructions.   colesevelam 625 MG tablet Commonly known as:  WELCHOL TAKE THREE TABLETS BY MOUTH TWICE DAILY WITH MEALS What changed:  how much to take  how to take this  when to take this  additional instructions   ezetimibe 10 MG tablet Commonly known as:  ZETIA TAKE ONE TABLET BY MOUTH ONCE DAILY What changed:  See the new instructions.   gabapentin 300 MG capsule Commonly known as:  NEURONTIN 1-2 tabs by mouth at bedtime for sleep and pain What changed:  how much to take  how to take this  when to take this  additional instructions   HYDROcodone-acetaminophen 5-325 MG tablet Commonly known as:  NORCO/VICODIN Take 1 tablet by mouth every 6 (six) hours as needed for moderate pain.   hydroxypropyl methylcellulose / hypromellose 2.5 % ophthalmic solution Commonly known as:  ISOPTO TEARS / GONIOVISC Place 1 drop into both eyes 3 (three) times daily as needed for dry eyes.   Lanthanum Carbonate 1000 MG Pack Take 3,000 mg by mouth 2 (two) times daily.   methimazole 5 MG tablet Commonly known as:  TAPAZOLE Take 1 tablet (5 mg total) by mouth daily.   metoprolol tartrate 25 MG tablet Commonly known as:  LOPRESSOR Take 1 tablet (25 mg total) by mouth 2 (two) times daily. ON NON-HD DAYS   midodrine 10 MG tablet Commonly known as:  PROAMATINE Take 1 tablet (10 mg total) by mouth See admin instructions. Once a day only on dialysis days (Tues/Thurs/Sat)   multivitamin Tabs tablet Take 1 tablet by mouth at bedtime. Reported on 10/24/2015   pantoprazole 40 MG tablet Commonly known as:  PROTONIX Take 40 mg by mouth every evening.     SENSIPAR 90 MG tablet Generic drug:  cinacalcet Take 90 mg by mouth 2 (two) times daily.            Discharge Care Instructions        Start     Ordered   05/22/17 0000  clopidogrel (PLAVIX) 75 MG tablet    Comments:  Please consider 90 day supplies to promote better adherence   05/22/17 1132   05/22/17 0000  Increase activity slowly     05/22/17 1132   05/22/17 0000  Diet - low sodium heart healthy  05/22/17 1132   05/22/17 Sebastian  (Home health needs / face to face )    Question Answer Comment  To provide the following care/treatments PT   To provide the following care/treatments OT   To provide the following care/treatments RN   To provide the following care/treatments Social work      05/22/17 1132   05/22/17 0000  Face-to-face encounter (required for Medicare/Medicaid patients)  (Home health needs / face to face )    Comments:  I Zahniya Zellars certify that this patient is under my care and that I, or a nurse practitioner or physician's assistant working with me, had a face-to-face encounter that meets the physician face-to-face encounter requirements with this patient on 05/22/2017. The encounter with the patient was in whole, or in part for the following medical condition(s) which is the primary reason for home health care (List medical condition): FTT  Question Answer Comment  The encounter with the patient was in whole, or in part, for the following medical condition, which is the primary reason for home health care FTT   I certify that, based on my findings, the following services are medically necessary home health services Nursing   I certify that, based on my findings, the following services are medically necessary home health services Physical therapy   Reason for Medically [place]- Change/Decline in Patient Status   My clinical findings support the need for the above services Unsafe ambulation due to balance issues    Further, I certify that my clinical findings support that this patient is homebound due to: Immunocompromised      05/22/17 1132     Allergies  Allergen Reactions  . Cephalexin Swelling and Other (See Comments)    Tongue swelling  . Statins Other (See Comments)    Weak muscles  . Ciprofloxacin Rash   Follow-up Information    Biagio Borg, MD Follow up in 1 week(s).   Specialties:  Internal Medicine, Radiology Why:  hospital discharge follow up Contact information: [place] [address] [phone]        Clarene Essex, MD Follow up in 1 week(s).   Specialty:  Gastroenterology Why:  to discuss probably restarting his Plavix in 1 week  Contact information: [address] [place] [address] [phone]        Earnie Larsson, MD Follow up.   Specialty:  Neurosurgery Contact information: [address] [place] [address] [place] [address] [phone]        continue dialysis Follow up.            The results of significant diagnostics from this hospitalization (including imaging, microbiology, ancillary and laboratory) are listed below for reference.    Significant Diagnostic Studies: Ct Abdomen Pelvis Wo Contrast  Result Date: 04/22/2017 CLINICAL DATA:  Rectal bleeding and weakness for several weeks, initial encounter EXAM: CT ABDOMEN AND PELVIS WITHOUT CONTRAST TECHNIQUE: Multidetector CT imaging of the abdomen and pelvis was performed following the standard protocol without IV contrast. COMPARISON:  10/14/2015 FINDINGS: Lower chest: Lung bases are well aerated. Only minimal dependent atelectatic changes are seen. Some mild bronchiectasis is noted in the right lower lobe. Hepatobiliary: Gallbladder has been surgically removed. Pneumobilia is noted likely related to prior intervention. Pancreas: Unremarkable. No pancreatic ductal dilatation or surrounding inflammatory changes. Spleen: Normal in size without focal  abnormality. Adrenals/Urinary Tract: Kidneys demonstrate bilateral cystic change and atrophy consistent with the  given clinical history of end-stage renal disease. A few tiny nonobstructing renal stones are seen. The adrenal glands are unremarkable. The bladder is decompressed. Stomach/Bowel: No obstructive changes are seen. No significant inflammatory changes are noted. The appendix is within normal limits. Vascular/Lymphatic: Aortic atherosclerosis. No enlarged abdominal or pelvic lymph nodes. Reproductive: Prostate is unremarkable. Other: No abdominal wall hernia or abnormality. No abdominopelvic ascites. Musculoskeletal: Degenerative changes of lumbar spine are seen. No acute bony abnormality is noted. Postsurgical changes are also noted in the lower lumbar spine. IMPRESSION: Chronic changes similar to that seen on prior exam. No acute abnormality is identified correspond with patient's given clinical symptomatology. Electronically Signed   By: Inez Catalina M.D.   On: 04/22/2017 20:12   Dg Chest 2 View  Result Date: 04/22/2017 CLINICAL DATA:  Patient states that he is having burning feeling in chest and very weak, states he has blood coming out of his rectum, that is a dark red and bright red. Hx of hyperlipidemia, NSTEMI, prostate cancer, diabetes, hypertension. EXAM: CHEST  2 VIEW COMPARISON:  Chest x-ray dated 12/26/2016. Chest x-ray dated 08/04/2016. FINDINGS: Heart size and mediastinal contours are within normal limits. Aortic atherosclerosis. Mild scarring in the left mid lung. Lungs otherwise clear. No pleural effusion or pneumothorax seen. No acute or suspicious osseous finding. IMPRESSION: No active cardiopulmonary disease. No evidence of pneumonia or pulmonary edema. Aortic atherosclerosis. Electronically Signed   By: Franki Cabot M.D.   On: 04/22/2017 13:20   Nm Gi Blood Loss  Result Date: 05/17/2017 CLINICAL DATA:  Recent colonoscopy with removal of polyps. Active GI bleeding. Initial  encounter. EXAM: NUCLEAR MEDICINE GASTROINTESTINAL BLEEDING SCAN TECHNIQUE: Sequential abdominal images were obtained following intravenous administration of Tc-55m labeled red blood cells. RADIOPHARMACEUTICALS:  26.2 mCi Tc-19m in-vitro labeled red cells. COMPARISON:  CT of the abdomen and pelvis performed 04/22/2017 FINDINGS: Normal blood pool activity is noted. There is accumulation of activity within the bladder, as expected. No abnormal focal accumulation of activity is seen to suggest active GI bleeding at this time. The study was continued for a total of 2 hours. IMPRESSION: No evidence of active GI bleeding on this study. Electronically Signed   By: Garald Balding M.D.   On: 05/17/2017 05:56   Dg Chest Port 1 View  Result Date: 05/18/2017 CLINICAL DATA:  Pulmonary edemaHx of CP, CAD, DM2, ESRD, PSVT, pulmonary nodule (RRL) EXAM: PORTABLE CHEST 1 VIEW COMPARISON:  04/22/2017 FINDINGS: The cardiac silhouette is normal in size. No mediastinal or hilar masses. No evidence of adenopathy. Mild linear scarring or chronic atelectasis in the left mid lung. Lungs are otherwise clear. No pleural effusion. No pneumothorax. Right arm vascular stent is stable. There stable changes from a previous cervical spine fusion. IMPRESSION: No acute cardiopulmonary disease. Electronically Signed   By: Lajean Manes M.D.   On: 05/18/2017 07:38   Dg Abd 2 Views  Result Date: 04/26/2017 CLINICAL DATA:  Rectal pain status post colonoscopy EXAM: ABDOMEN - 2 VIEW COMPARISON:  04/22/2017 CT FINDINGS: Lung bases are clear. No free air beneath the diaphragm. Retained contrast material in the distal esophagus, stomach, small and large bowel. Surgical clips in the right upper quadrant. Pneumobilia as demonstrated on prior CT. Nonobstructed bowel-gas pattern metallic densities overlying the pubic symphysis. Postsurgical changes of the lower lumbar spine. IMPRESSION: 1. Nonobstructed gas pattern with residual contrast material in the  distal esophagus, stomach, small and large bowel 2. Pneumobilia, likely due to postsurgical changes as was present on prior CT.  Electronically Signed   By: Donavan Foil M.D.   On: 04/26/2017 19:14    Microbiology: Recent Results (from the past 240 hour(s))  MRSA PCR Screening     Status: None   Collection Time: 05/17/17  1:51 AM  Result Value Ref Range Status   MRSA by PCR NEGATIVE NEGATIVE Final    Comment:        The GeneXpert MRSA Assay (FDA approved for NASAL specimens only), is one component of a comprehensive MRSA colonization surveillance program. It is not intended to diagnose MRSA infection nor to guide or monitor treatment for MRSA infections.      Labs: Basic Metabolic Panel:  Recent Labs Lab 05/16/17 2115 05/17/17 0715 05/18/17 0213 05/19/17 1913 05/21/17 0859  NA 143 144 139 138 138  K 4.6 4.9 4.0 4.1 4.1  CL 105 109 101 100* 99*  CO2 24 23 26 26   --   GLUCOSE 145* 148* 92 109* 89  BUN 58* 62* 29* 50* 34*  CREATININE 11.42* 11.59* 7.39* 11.26* 9.90*  CALCIUM 8.8* 8.0* 7.5* 8.7*  --   MG  --  2.1  --   --   --   PHOS  --  5.3* 3.9 7.2*  --    Liver Function Tests:  Recent Labs Lab 05/18/17 0213 05/19/17 1913  ALBUMIN 2.9* 2.9*   No results for input(s): LIPASE, AMYLASE in the last 168 hours. No results for input(s): AMMONIA in the last 168 hours. CBC:  Recent Labs Lab 05/19/17 2320 05/20/17 0217 05/20/17 1831 05/21/17 0537 05/21/17 0859 05/22/17 0449  WBC 8.4 6.4 7.6 7.2  --  7.2  HGB 9.4* 8.9* 9.6* 8.5* 9.2* 8.9*  HCT 28.4* 27.5* 29.7* 26.8* 27.0* 28.1*  MCV 89.3 89.9 92.2 91.5  --  92.1  PLT 213 226 266 248  --  276   Cardiac Enzymes: No results for input(s): CKTOTAL, CKMB, CKMBINDEX, TROPONINI in the last 168 hours. BNP: BNP (last 3 results) No results for input(s): BNP in the last 8760 hours.  ProBNP (last 3 results) No results for input(s): PROBNP in the last 8760 hours.  CBG:  Recent Labs Lab 05/21/17 0824  05/21/17 1149 05/21/17 1746 05/21/17 2109 05/22/17 0809  GLUCAP 86 87 84 106* 113*       Signed:  Wilver Tignor MD, PhD  [facility] 05/22/2017, 11:32 AM

## 2017-05-22 NOTE — Progress Notes (Signed)
[facility] Progress Note   Subjective: Patient up in chair. Insists that he feels better when he had hemodialysis in hospital than at center. Discussed strategedies to improve HD experience at center. No C/O pain, No further bleeding issues. DC home today with HH.   Objective Vitals:   05/21/17 1654 05/21/17 1749 05/21/17 2113 05/22/17 0654  BP: 129/60 (!) 144/68 (!) 126/38 137/60  Pulse: 70 73 73 81  Resp: 14 20 18 18   Temp: 97.7 F (36.5 C) 98.1 F (36.7 C) 98.2 F (36.8 C) 97.9 F (36.6 C)  TempSrc: Oral Oral Oral Oral  SpO2: 96% 98% 95% 100%  Weight: 102.2 kg (225 lb 5 oz)   102.5 kg (225 lb 14.4 oz)  Height:       Physical Exam General: Pleasant, NAD Heart: S1,S2, RRR Lungs: CTAB A/P Abdomen: Active BS Extremities: No LE edema Dialysis Access: RUA AVF + bruit    Additional Objective Labs: Basic Metabolic Panel:  Recent Labs Lab 05/17/17 0715 05/18/17 0213 05/19/17 1913 05/21/17 0859  NA 144 139 138 138  K 4.9 4.0 4.1 4.1  CL 109 101 100* 99*  CO2 23 26 26   --   GLUCOSE 148* 92 109* 89  BUN 62* 29* 50* 34*  CREATININE 11.59* 7.39* 11.26* 9.90*  CALCIUM 8.0* 7.5* 8.7*  --   PHOS 5.3* 3.9 7.2*  --    Liver Function Tests:  Recent Labs Lab 05/18/17 0213 05/19/17 1913  ALBUMIN 2.9* 2.9*   No results for input(s): LIPASE, AMYLASE in the last 168 hours. CBC:  Recent Labs Lab 05/19/17 2320 05/20/17 0217 05/20/17 1831 05/21/17 0537 05/21/17 0859 05/22/17 0449  WBC 8.4 6.4 7.6 7.2  --  7.2  HGB 9.4* 8.9* 9.6* 8.5* 9.2* 8.9*  HCT 28.4* 27.5* 29.7* 26.8* 27.0* 28.1*  MCV 89.3 89.9 92.2 91.5  --  92.1  PLT 213 226 266 248  --  276   Blood Culture    Component Value Date/Time   SDES BLOOD LEFT UPPER ARM 10/16/2016 2330   SPECREQUEST BOTTLES DRAWN AEROBIC AND ANAEROBIC 5CC EA 10/16/2016 2330   CULT NO GROWTH 5 DAYS 10/16/2016 2330   REPTSTATUS 10/22/2016 FINAL 10/16/2016 2330    Cardiac Enzymes: No results for input(s):  CKTOTAL, CKMB, CKMBINDEX, TROPONINI in the last 168 hours. CBG:  Recent Labs Lab 05/21/17 1149 05/21/17 1746 05/21/17 2109 05/22/17 0809 05/22/17 1219  GLUCAP 87 84 106* 113* 148*   Iron Studies: No results for input(s): IRON, TIBC, TRANSFERRIN, FERRITIN in the last 72 hours. @lablastinr3 @ Studies/Results: No results found. Medications: . sodium chloride    . sodium chloride     . allopurinol  100 mg Oral Daily  . chlorhexidine  15 mL Mouth Rinse BID  . cinacalcet  90 mg Oral BID WC  . citalopram  10 mg Oral Daily  . colesevelam  1,875 mg Oral BID WC  . darbepoetin (ARANESP) injection - DIALYSIS  200 mcg Intravenous Q Fri-HD  . doxercalciferol  6 mcg Intravenous Q T,Th,Sa-HD  . ezetimibe  10 mg Oral Daily  . gabapentin  600 mg Oral QHS  . insulin aspart  0-5 Units Subcutaneous QHS  . insulin aspart  0-9 Units Subcutaneous TID WC  . lanthanum  1,000 mg Oral TID WC  . mouth rinse  15 mL Mouth Rinse q12n4p  . methimazole  5 mg Oral Daily  . metoprolol tartrate  25 mg Oral 2 times per day on Sun Mon Wed Fri  .  midodrine  10 mg Oral Q T,Th,Sat-1800  . multivitamin  1 tablet Oral QHS  . pantoprazole  40 mg Oral QPM  . polyethylene glycol  17 g Oral Daily     Dialysis: SW TTS 4h   BFR 450/800 2K/2Ca   EDW 105.5kg  R AVF No heparin [name] 6 mcg IV q HD Mircera 75 mcg IV q 4 weeks (dosed 7/31, last OP Hgb 10.0 on 8/9)       Impression: 1. GI Bleed/Hematochezia - No further bleeding episodes. Nuclear bleeding scan neg. Underwent RFA rx for radiation proctitis on 8/21.  Will f/u w GI in op setting.  2. ESRD - TTS - some cannulation issues, use smaller needles. HD tomorrow in-center.   3. Hypertension/volume - BP stable, midodrine on OP med list. Lower EDW 102.5 on DC.  4. Anemia - HBG 8.9 . Resumed ESA with HD 99/14/44.  5. Metabolic bone disease - Cont VDRA/Fosrenol binder  6. Nutrition - on CL, renal diet when resumes eating  7.    CAD/ICM/STEMI - plavix on hold  for GIB. Resume in 1 week.  8.   DIspo - prob dc today, per primary  Rita H. Brown NP-C 05/22/2017, 12:50 PM  [facility] [phone]  Pt seen, examined and agree w A/P as above.  Kelly Splinter MD [employer] pager [phone]   05/22/2017, 3:12 PM

## 2017-05-23 ENCOUNTER — Telehealth: Payer: Self-pay | Admitting: Internal Medicine

## 2017-05-23 ENCOUNTER — Ambulatory Visit: Payer: Self-pay

## 2017-05-23 DIAGNOSIS — D509 Iron deficiency anemia, unspecified: Secondary | ICD-10-CM | POA: Diagnosis not present

## 2017-05-23 DIAGNOSIS — N186 End stage renal disease: Secondary | ICD-10-CM | POA: Diagnosis not present

## 2017-05-23 DIAGNOSIS — D631 Anemia in chronic kidney disease: Secondary | ICD-10-CM | POA: Diagnosis not present

## 2017-05-23 DIAGNOSIS — E119 Type 2 diabetes mellitus without complications: Secondary | ICD-10-CM | POA: Diagnosis not present

## 2017-05-23 DIAGNOSIS — N2581 Secondary hyperparathyroidism of renal origin: Secondary | ICD-10-CM | POA: Diagnosis not present

## 2017-05-23 NOTE — Telephone Encounter (Signed)
Did receive referral for home health services.  PT will be going to see patient on Monday 8/27 to start services.

## 2017-05-24 ENCOUNTER — Other Ambulatory Visit: Payer: Self-pay

## 2017-05-24 NOTE — Patient Outreach (Signed)
[facility]) Care Management  05/24/17  [name] [birth year] [id number]   Successful outreach completed with patient. Patient identification verified.  Patient stated that he hasn't been feeling too great.  He denies any bleeding since discharge home. He stated that when he went to the hospital, blood was "just gushing out of me and I was laying in a big puddle of blood." Patient stated that he is holding his blood thinner (per MD orders) until his follow up appointment is completed. Patient reported cramps in his legs and thighs that has been ongoing, but feels a little worse. He stated that he really does not know what is wrong with him.   Patient currently denies any pain or concerns. He is supposed to have home health, but they have not called yet that he is aware of. He reported that his wife schedules all of that so he is not sure if they have called or not.  Plan: Home visit scheduled for next week for follow up.  [place] R. Ahlam Piscitelli, RN, BSN, [place] Management Coordinator [phone]

## 2017-05-25 DIAGNOSIS — N2581 Secondary hyperparathyroidism of renal origin: Secondary | ICD-10-CM | POA: Diagnosis not present

## 2017-05-25 DIAGNOSIS — E119 Type 2 diabetes mellitus without complications: Secondary | ICD-10-CM | POA: Diagnosis not present

## 2017-05-25 DIAGNOSIS — N186 End stage renal disease: Secondary | ICD-10-CM | POA: Diagnosis not present

## 2017-05-25 DIAGNOSIS — D631 Anemia in chronic kidney disease: Secondary | ICD-10-CM | POA: Diagnosis not present

## 2017-05-25 DIAGNOSIS — D509 Iron deficiency anemia, unspecified: Secondary | ICD-10-CM | POA: Diagnosis not present

## 2017-05-27 ENCOUNTER — Telehealth: Payer: Self-pay | Admitting: Internal Medicine

## 2017-05-27 DIAGNOSIS — I2510 Atherosclerotic heart disease of native coronary artery without angina pectoris: Secondary | ICD-10-CM | POA: Diagnosis not present

## 2017-05-27 DIAGNOSIS — K625 Hemorrhage of anus and rectum: Secondary | ICD-10-CM | POA: Diagnosis not present

## 2017-05-27 DIAGNOSIS — I5042 Chronic combined systolic (congestive) and diastolic (congestive) heart failure: Secondary | ICD-10-CM | POA: Diagnosis not present

## 2017-05-27 DIAGNOSIS — I132 Hypertensive heart and chronic kidney disease with heart failure and with stage 5 chronic kidney disease, or end stage renal disease: Secondary | ICD-10-CM | POA: Diagnosis not present

## 2017-05-27 DIAGNOSIS — D631 Anemia in chronic kidney disease: Secondary | ICD-10-CM | POA: Diagnosis not present

## 2017-05-27 DIAGNOSIS — E1122 Type 2 diabetes mellitus with diabetic chronic kidney disease: Secondary | ICD-10-CM | POA: Diagnosis not present

## 2017-05-27 NOTE — Telephone Encounter (Signed)
Needs verbals for skilled nursing for 2week1 and 1week4 2PRNS

## 2017-05-27 NOTE — Telephone Encounter (Signed)
Ok for verbal 

## 2017-05-27 NOTE — Telephone Encounter (Signed)
MD out of office today will hold for his return on tomorrow...Johny Chess

## 2017-05-28 DIAGNOSIS — D631 Anemia in chronic kidney disease: Secondary | ICD-10-CM | POA: Diagnosis not present

## 2017-05-28 DIAGNOSIS — E119 Type 2 diabetes mellitus without complications: Secondary | ICD-10-CM | POA: Diagnosis not present

## 2017-05-28 DIAGNOSIS — N186 End stage renal disease: Secondary | ICD-10-CM | POA: Diagnosis not present

## 2017-05-28 DIAGNOSIS — N2581 Secondary hyperparathyroidism of renal origin: Secondary | ICD-10-CM | POA: Diagnosis not present

## 2017-05-28 DIAGNOSIS — D509 Iron deficiency anemia, unspecified: Secondary | ICD-10-CM | POA: Diagnosis not present

## 2017-05-28 NOTE — Telephone Encounter (Signed)
05/21/17, left message to call to schedule PT eval, pts wife called 05/22/17, pt in hospital

## 2017-05-28 NOTE — Telephone Encounter (Signed)
Notified Lisa w/MD response...Johny Chess

## 2017-05-29 ENCOUNTER — Encounter: Payer: Self-pay | Admitting: Endocrinology

## 2017-05-29 ENCOUNTER — Ambulatory Visit (INDEPENDENT_AMBULATORY_CARE_PROVIDER_SITE_OTHER): Payer: Medicare Other | Admitting: Endocrinology

## 2017-05-29 VITALS — BP 138/82 | HR 108 | Wt 226.2 lb

## 2017-05-29 DIAGNOSIS — E1121 Type 2 diabetes mellitus with diabetic nephropathy: Secondary | ICD-10-CM | POA: Diagnosis not present

## 2017-05-29 DIAGNOSIS — E0590 Thyrotoxicosis, unspecified without thyrotoxic crisis or storm: Secondary | ICD-10-CM

## 2017-05-29 DIAGNOSIS — R159 Full incontinence of feces: Secondary | ICD-10-CM | POA: Diagnosis not present

## 2017-05-29 DIAGNOSIS — K921 Melena: Secondary | ICD-10-CM | POA: Diagnosis not present

## 2017-05-29 LAB — TSH: TSH: 1.06 u[IU]/mL (ref 0.35–4.50)

## 2017-05-29 LAB — T4, FREE: FREE T4: 0.79 ng/dL (ref 0.60–1.60)

## 2017-05-29 NOTE — Patient Instructions (Signed)
Please continue the same medications. blood tests are requested for you today.  We'll let you know about the results. If ever you have fever while taking methimazole, stop it and call us, even if the reason is obvious, because of the risk of a rare side-effect. Please come back for a follow-up appointment in 4-5 months.

## 2017-05-29 NOTE — Progress Notes (Signed)
   Subjective:    Patient ID: [name], male    DOB: 01-27-1951, 66 y.o.   MRN: [id number]  HPI Pt has diabetes mellitus:  DM type: 2 Dx'ed: 4709 Complications: polyneuropathy, renal failure, and CAD.  Therapy: welchol DKA: never.  Severe hypoglycemia: never.  Pancreatitis: never.  Other: med needs have decreased with worsening of renal function; fructosamine converts to a1c approx 0.5% lower than a1c itself.   Interval history:  no cbg record, but states cbg's vary from 90-120.  There is no trend throughout the day.  He denies hypoglycemia.   Pt also has Hyperthyroidism (due to a multinodular goiter; dx'ed 2009; bx then was benign; nuc med scan showed very low uptake, so tapazole was chosen as rx).  He takes tapazole as rx'ed.   Review of Systems He denies hypoglycemia    Objective:   Physical Exam VITAL SIGNS:  See vs page GENERAL: no distress.  Pulses: foot pulses are intact bilaterally.   MSK: no deformity of the feet or ankles.  CV: 1+ bilat edema of the legs. Skin:  no ulcer on the feet or ankles.  normal color and temp on the feet and ankles Neuro: sensation is intact to touch on the feet and ankles, but decreased from normal.   Lab Results  Component Value Date   HGBA1C 5.7 (H) 05/20/2017   Lab Results  Component Value Date   TSH 1.06 05/29/2017   T4TOTAL 5.6 07/19/2008      Assessment & Plan:  Type 2 DM, with CAD: well-controlled. Transfusion: this can suppress a1c Renal failure: in this setting also, we should check fructosamine Hyperthyroidism: well-controlled  Patient Instructions  Please continue the same medications. blood tests are requested for you today.  We'll let you know about the results. If ever you have fever while taking methimazole, stop it and call us, even if the reason is obvious, because of the risk of a rare side-effect. Please come back for a follow-up appointment in 4-5 months.

## 2017-05-30 DIAGNOSIS — D509 Iron deficiency anemia, unspecified: Secondary | ICD-10-CM | POA: Diagnosis not present

## 2017-05-30 DIAGNOSIS — N186 End stage renal disease: Secondary | ICD-10-CM | POA: Diagnosis not present

## 2017-05-30 DIAGNOSIS — D631 Anemia in chronic kidney disease: Secondary | ICD-10-CM | POA: Diagnosis not present

## 2017-05-30 DIAGNOSIS — E119 Type 2 diabetes mellitus without complications: Secondary | ICD-10-CM | POA: Diagnosis not present

## 2017-05-30 DIAGNOSIS — N2581 Secondary hyperparathyroidism of renal origin: Secondary | ICD-10-CM | POA: Diagnosis not present

## 2017-05-31 ENCOUNTER — Other Ambulatory Visit: Payer: Self-pay

## 2017-05-31 ENCOUNTER — Telehealth: Payer: Self-pay | Admitting: Internal Medicine

## 2017-05-31 DIAGNOSIS — Z992 Dependence on renal dialysis: Secondary | ICD-10-CM | POA: Diagnosis not present

## 2017-05-31 DIAGNOSIS — E1122 Type 2 diabetes mellitus with diabetic chronic kidney disease: Secondary | ICD-10-CM | POA: Diagnosis not present

## 2017-05-31 DIAGNOSIS — D631 Anemia in chronic kidney disease: Secondary | ICD-10-CM | POA: Diagnosis not present

## 2017-05-31 DIAGNOSIS — E1129 Type 2 diabetes mellitus with other diabetic kidney complication: Secondary | ICD-10-CM | POA: Diagnosis not present

## 2017-05-31 DIAGNOSIS — K625 Hemorrhage of anus and rectum: Secondary | ICD-10-CM | POA: Diagnosis not present

## 2017-05-31 DIAGNOSIS — I2510 Atherosclerotic heart disease of native coronary artery without angina pectoris: Secondary | ICD-10-CM | POA: Diagnosis not present

## 2017-05-31 DIAGNOSIS — I132 Hypertensive heart and chronic kidney disease with heart failure and with stage 5 chronic kidney disease, or end stage renal disease: Secondary | ICD-10-CM | POA: Diagnosis not present

## 2017-05-31 DIAGNOSIS — N186 End stage renal disease: Secondary | ICD-10-CM | POA: Diagnosis not present

## 2017-05-31 DIAGNOSIS — I5042 Chronic combined systolic (congestive) and diastolic (congestive) heart failure: Secondary | ICD-10-CM | POA: Diagnosis not present

## 2017-05-31 LAB — FRUCTOSAMINE: Fructosamine: 265 umol/L (ref 190–270)

## 2017-05-31 NOTE — Telephone Encounter (Signed)
Left msg on Kate vm ok for verbal../lmb

## 2017-05-31 NOTE — Patient Outreach (Signed)
[facility]) Care Management  [place]  05/31/2017   EURAL HOLZSCHUH [birth year] [id number]   Home visit completed with patient. Patient's wife was not present at visit due to attending a funeral.  Subjective: Patient stated that he has been doing "pretty good."  Objective:   Encounter Medications:  Outpatient Encounter Prescriptions as of 05/31/2017  Medication Sig  . acetaminophen (TYLENOL) 325 MG tablet Take 650 mg by mouth every 6 (six) hours as needed for mild pain or headache.   . allopurinol (ZYLOPRIM) 100 MG tablet Take 1 tablet (100 mg total) by mouth daily.  . citalopram (CELEXA) 10 MG tablet Take 10 mg by mouth daily as needed (anxiety).   . clopidogrel (PLAVIX) 75 MG tablet Please hold plavix for a week, please see GI Dr Watt Climes in a week to discuss resuming plavix  . colesevelam (WELCHOL) 625 MG tablet TAKE THREE TABLETS BY MOUTH TWICE DAILY WITH MEALS (Patient taking differently: Take 1,875 mg by mouth 2 (two) times daily with a meal. )  . ezetimibe (ZETIA) 10 MG tablet TAKE ONE TABLET BY MOUTH ONCE DAILY (Patient taking differently: TAKE 10 MG BY MOUTH ONCE DAILY)  . gabapentin (NEURONTIN) 300 MG capsule 1-2 tabs by mouth at bedtime for sleep and pain (Patient taking differently: Take 600 mg by mouth at bedtime. )  . HYDROcodone-acetaminophen (NORCO/VICODIN) 5-325 MG tablet Take 1 tablet by mouth every 6 (six) hours as needed for moderate pain.  . hydroxypropyl methylcellulose / hypromellose (ISOPTO TEARS / GONIOVISC) 2.5 % ophthalmic solution Place 1 drop into both eyes 3 (three) times daily as needed for dry eyes.  . Lanthanum Carbonate 1000 MG PACK Take 3,000 mg by mouth 2 (two) times daily.   . methimazole (TAPAZOLE) 5 MG tablet Take 1 tablet (5 mg total) by mouth daily.  . metoprolol tartrate (LOPRESSOR) 25 MG tablet Take 1 tablet (25 mg total) by mouth 2 (two) times daily. ON NON-HD DAYS  . midodrine (PROAMATINE) 10 MG tablet Take 1 tablet (10 mg  total) by mouth See admin instructions. Once a day only on dialysis days (Tues/Thurs/Sat)  . multivitamin (RENA-VIT) TABS tablet Take 1 tablet by mouth at bedtime. Reported on 10/24/2015  . pantoprazole (PROTONIX) 40 MG tablet Take 40 mg by mouth every evening.   . polyethylene glycol (MIRALAX / GLYCOLAX) packet Take 17 g by mouth daily.  . SENSIPAR 90 MG tablet Take 90 mg by mouth 2 (two) times daily.   No facility-administered encounter medications on file as of 05/31/2017.     Functional Status:  In your present state of health, do you have any difficulty performing the following activities: 05/17/2017 04/22/2017  Hearing? N N  Vision? N N  Difficulty concentrating or making decisions? N N  Walking or climbing stairs? N N  Dressing or bathing? N N  Doing errands, shopping? Y Y  Preparing Food and eating ? - -  Using the Toilet? - -  In the past six months, have you accidently leaked urine? - -  Do you have problems with loss of bowel control? - -  Managing your Medications? - -  Managing your Finances? - -  Housekeeping or managing your Housekeeping? - -  Some recent data might be hidden    Fall/Depression Screening: Fall Risk  04/17/2017 02/18/2017 02/07/2017  Falls in the past year? Yes Yes Yes  Comment No new falls since 02/07/2017 per patient - -  Number falls in past yr: 2 or more  2 or more 2 or more  Injury with Fall? Yes No Yes  Risk Factor Category  High Fall Risk - High Fall Risk  Risk for fall due to : History of fall(s);Impaired balance/gait;Impaired mobility - History of fall(s);Impaired balance/gait;Impaired mobility  Follow up Falls prevention discussed;Falls evaluation completed;Education provided - Falls evaluation completed;Education provided;Falls prevention discussed   PHQ 2/9 Scores 02/07/2017 02/01/2017 10/15/2016 12/29/2015 10/24/2015 10/21/2015 01/13/2014  PHQ - 2 Score 6 1 2  0 0 0 2  PHQ- 9 Score 11 - 6 - - - -    Assessment:  Patient reported that his PT was  there today, but they did not do a lot today. He stated that it was the initial visit, so they went over a lot of questions.  Initially, when [facility] assessed patient for pain, he stated, "I don't ever have any pain." As the conversation progressed during the home visit, he began to talk about how he bad he hurts all the time. He stated that he feels the best when laying in the bed. He stated that is why he does not like any morning appointments, because that is when he is feeling the best and he does not want to get up.  Patient stated that he has noticed a little trace of bleeding every now and then. He stated that usually he only sees a little blood when he wipes himself, but no blood dripping into the toilet as before. He denies any active bleeding/soiling clothing. He stated that he has started taking aspirin again per his doctor's orders, but that his Plavix is still being held.  Patient stated that he saw his GI doctor, Dr. Paulita Fujita this past Wednesday. He stated that he told him that he went inside his rectum with a laser and burned the places that were bleeding so that they would stop. He stated that since then, he has had some difficulty with controlling his rectum when he has a bowel movement and has to be careful not to make a mess. He voiced concerns about whether or not this will get better. Patient has no other concerns or questions today. He was agreeable to continued outreach for transition of care. RNCM encouraged him to call if he has any needs/concerns. Plan: RNCM to follow up telephonically with patient next week for continued TOC outreach and patient was agreeable.  [place] R. [name] Berkey, RN, BSN, [place] Management Coordinator [phone]

## 2017-05-31 NOTE — Telephone Encounter (Signed)
Kate white , [facility]  [phone]  Need verbals for PT  1 week 2 2 week 6

## 2017-05-31 NOTE — Telephone Encounter (Signed)
Ok for verbals 

## 2017-06-01 DIAGNOSIS — N186 End stage renal disease: Secondary | ICD-10-CM | POA: Diagnosis not present

## 2017-06-01 DIAGNOSIS — Z23 Encounter for immunization: Secondary | ICD-10-CM | POA: Diagnosis not present

## 2017-06-01 DIAGNOSIS — N2581 Secondary hyperparathyroidism of renal origin: Secondary | ICD-10-CM | POA: Diagnosis not present

## 2017-06-01 DIAGNOSIS — D631 Anemia in chronic kidney disease: Secondary | ICD-10-CM | POA: Diagnosis not present

## 2017-06-01 DIAGNOSIS — E119 Type 2 diabetes mellitus without complications: Secondary | ICD-10-CM | POA: Diagnosis not present

## 2017-06-01 DIAGNOSIS — D509 Iron deficiency anemia, unspecified: Secondary | ICD-10-CM | POA: Diagnosis not present

## 2017-06-04 DIAGNOSIS — N2581 Secondary hyperparathyroidism of renal origin: Secondary | ICD-10-CM | POA: Diagnosis not present

## 2017-06-04 DIAGNOSIS — N186 End stage renal disease: Secondary | ICD-10-CM | POA: Diagnosis not present

## 2017-06-04 DIAGNOSIS — E119 Type 2 diabetes mellitus without complications: Secondary | ICD-10-CM | POA: Diagnosis not present

## 2017-06-04 DIAGNOSIS — D509 Iron deficiency anemia, unspecified: Secondary | ICD-10-CM | POA: Diagnosis not present

## 2017-06-04 DIAGNOSIS — Z23 Encounter for immunization: Secondary | ICD-10-CM | POA: Diagnosis not present

## 2017-06-04 DIAGNOSIS — D631 Anemia in chronic kidney disease: Secondary | ICD-10-CM | POA: Diagnosis not present

## 2017-06-04 NOTE — Patient Outreach (Addendum)
[facility]) Care Management  Late entry for 05/31/2017  Following patient's home visit, RNCM received return call from [name] at [facility] abdominal transplant team and spoke with her about patient for clarification about his letter of eligibility that was sent to him stating he was not eligible for a transplant due to use of a walker.  She verified that patient is currently not eligible for a kidney transplant based on their requirements. She stated that the use of a walker or wheelchair due to weakness does make someone ineligible for a kidney transplant unless they use it for a prosthetic device. She stated she could not answer for other transplant centers, but stated that they usually take cases that are not approved at other centers. She also stated that this was not a permanent decision. She stated that if he eventually does meet eligibility requirements, he could still be considered for a kidney transplant in the future.  She also stated that patient had been given a weight loss goal and he had not met that, which also keeps him from being eligible. She stated that in 2016 when he was reviewed, he had some cardiac issues going on and weakness and was requesting PT at that time for strengthening. She stated that he was weighing in at 247 lbs and was told he would have to lose 20 lbs. She stated that he was not there when she reviewed him earlier this year. RNCM provided patient's most recent weight at 227 lbs and she said that he now meets that requirement. She stated he is can keep his weight at 227 or below and once he is able to walk without a walker, he can be re-referred to them in January 2019 for a new assessment. She stated that the walker requirement is because with this major surgery, if they are already weakened and unable to walk independently, he will have a lot of difficulty post-operatively with recovery and therapy.   RNCM thanked her for her call and will  relay this information to Mr. Kasal at next outreach.  [place] R. Jahnyla Parrillo, RN, BSN, [place] Management Coordinator [phone]

## 2017-06-05 ENCOUNTER — Telehealth: Payer: Self-pay | Admitting: Internal Medicine

## 2017-06-05 DIAGNOSIS — Z992 Dependence on renal dialysis: Secondary | ICD-10-CM | POA: Diagnosis not present

## 2017-06-05 DIAGNOSIS — N186 End stage renal disease: Secondary | ICD-10-CM | POA: Diagnosis not present

## 2017-06-05 DIAGNOSIS — I871 Compression of vein: Secondary | ICD-10-CM | POA: Diagnosis not present

## 2017-06-05 NOTE — Telephone Encounter (Signed)
Notified Kate w/MD response../lmb 

## 2017-06-05 NOTE — Telephone Encounter (Signed)
[name] from [facility]  [phone]  Need verbals PT 1 week 2 2 week 6

## 2017-06-05 NOTE — Telephone Encounter (Signed)
Ok for verbals 

## 2017-06-06 DIAGNOSIS — D631 Anemia in chronic kidney disease: Secondary | ICD-10-CM | POA: Diagnosis not present

## 2017-06-06 DIAGNOSIS — Z23 Encounter for immunization: Secondary | ICD-10-CM | POA: Diagnosis not present

## 2017-06-06 DIAGNOSIS — N186 End stage renal disease: Secondary | ICD-10-CM | POA: Diagnosis not present

## 2017-06-06 DIAGNOSIS — D509 Iron deficiency anemia, unspecified: Secondary | ICD-10-CM | POA: Diagnosis not present

## 2017-06-06 DIAGNOSIS — N2581 Secondary hyperparathyroidism of renal origin: Secondary | ICD-10-CM | POA: Diagnosis not present

## 2017-06-06 DIAGNOSIS — E119 Type 2 diabetes mellitus without complications: Secondary | ICD-10-CM | POA: Diagnosis not present

## 2017-06-07 ENCOUNTER — Other Ambulatory Visit: Payer: Self-pay

## 2017-06-07 DIAGNOSIS — I5042 Chronic combined systolic (congestive) and diastolic (congestive) heart failure: Secondary | ICD-10-CM | POA: Diagnosis not present

## 2017-06-07 DIAGNOSIS — I2510 Atherosclerotic heart disease of native coronary artery without angina pectoris: Secondary | ICD-10-CM | POA: Diagnosis not present

## 2017-06-07 DIAGNOSIS — I132 Hypertensive heart and chronic kidney disease with heart failure and with stage 5 chronic kidney disease, or end stage renal disease: Secondary | ICD-10-CM | POA: Diagnosis not present

## 2017-06-07 DIAGNOSIS — D631 Anemia in chronic kidney disease: Secondary | ICD-10-CM | POA: Diagnosis not present

## 2017-06-07 DIAGNOSIS — E1122 Type 2 diabetes mellitus with diabetic chronic kidney disease: Secondary | ICD-10-CM | POA: Diagnosis not present

## 2017-06-07 DIAGNOSIS — K625 Hemorrhage of anus and rectum: Secondary | ICD-10-CM | POA: Diagnosis not present

## 2017-06-07 NOTE — Patient Outreach (Signed)
[facility]) Care Management  06/07/17  [name] October 20, 1950 [id number]  Successful outreach completed with patient. Patient identification verified.  RNCM discussed need to discuss plan of care with his PT. Advised patient will need to work towards walking without his walker in order to be approved for a kidney transplant. Will let PT know so that she can work with patient on increasing independence with ambulation.  Plan: Patient to have his PT call RNCM next visit to discuss plan of care/goals to increase independence with ambulation.   [place] R. Angelic Schnelle, RN, BSN, [place] Management Coordinator [phone]

## 2017-06-08 DIAGNOSIS — E119 Type 2 diabetes mellitus without complications: Secondary | ICD-10-CM | POA: Diagnosis not present

## 2017-06-08 DIAGNOSIS — D509 Iron deficiency anemia, unspecified: Secondary | ICD-10-CM | POA: Diagnosis not present

## 2017-06-08 DIAGNOSIS — Z23 Encounter for immunization: Secondary | ICD-10-CM | POA: Diagnosis not present

## 2017-06-08 DIAGNOSIS — N2581 Secondary hyperparathyroidism of renal origin: Secondary | ICD-10-CM | POA: Diagnosis not present

## 2017-06-08 DIAGNOSIS — N186 End stage renal disease: Secondary | ICD-10-CM | POA: Diagnosis not present

## 2017-06-08 DIAGNOSIS — D631 Anemia in chronic kidney disease: Secondary | ICD-10-CM | POA: Diagnosis not present

## 2017-06-10 ENCOUNTER — Other Ambulatory Visit: Payer: Self-pay

## 2017-06-10 DIAGNOSIS — E1122 Type 2 diabetes mellitus with diabetic chronic kidney disease: Secondary | ICD-10-CM | POA: Diagnosis not present

## 2017-06-10 DIAGNOSIS — K625 Hemorrhage of anus and rectum: Secondary | ICD-10-CM | POA: Diagnosis not present

## 2017-06-10 DIAGNOSIS — D631 Anemia in chronic kidney disease: Secondary | ICD-10-CM | POA: Diagnosis not present

## 2017-06-10 DIAGNOSIS — I5042 Chronic combined systolic (congestive) and diastolic (congestive) heart failure: Secondary | ICD-10-CM | POA: Diagnosis not present

## 2017-06-10 DIAGNOSIS — I132 Hypertensive heart and chronic kidney disease with heart failure and with stage 5 chronic kidney disease, or end stage renal disease: Secondary | ICD-10-CM | POA: Diagnosis not present

## 2017-06-10 DIAGNOSIS — I2510 Atherosclerotic heart disease of native coronary artery without angina pectoris: Secondary | ICD-10-CM | POA: Diagnosis not present

## 2017-06-10 NOTE — Patient Outreach (Signed)
[facility]) Care Management  06/10/17  [name] March 24, 1951 [id number]  RNCM received voicemail from Gennaro Africa, PT with [facility] while at her visit with patient. Kate's cell phone number is [phone].   RNCM returned call to [name] and discussed patient's kidney transplant status, and the rationale behind his denial was that he was not ambulatory without his walker. [name] stated that she could work on specific goals to help patient get where he needs to be to be approved for his transplant, but needs more specific information.  RNCM to get more detailed information from [facility] transplant team about specific information so that she can work with patient in the home to meet goals for kidney transplant.  [place] R. Katherleen Folkes, RN, BSN, [place] Management Coordinator [phone]   [place]. Louvinia Cumbo, RN, BSN, [place] Management Coordinator [phone]

## 2017-06-11 DIAGNOSIS — D631 Anemia in chronic kidney disease: Secondary | ICD-10-CM | POA: Diagnosis not present

## 2017-06-11 DIAGNOSIS — E119 Type 2 diabetes mellitus without complications: Secondary | ICD-10-CM | POA: Diagnosis not present

## 2017-06-11 DIAGNOSIS — N2581 Secondary hyperparathyroidism of renal origin: Secondary | ICD-10-CM | POA: Diagnosis not present

## 2017-06-11 DIAGNOSIS — N186 End stage renal disease: Secondary | ICD-10-CM | POA: Diagnosis not present

## 2017-06-11 DIAGNOSIS — Z23 Encounter for immunization: Secondary | ICD-10-CM | POA: Diagnosis not present

## 2017-06-11 DIAGNOSIS — D509 Iron deficiency anemia, unspecified: Secondary | ICD-10-CM | POA: Diagnosis not present

## 2017-06-12 ENCOUNTER — Encounter: Payer: Self-pay | Admitting: Neurology

## 2017-06-12 ENCOUNTER — Ambulatory Visit (INDEPENDENT_AMBULATORY_CARE_PROVIDER_SITE_OTHER): Payer: Medicare Other | Admitting: Neurology

## 2017-06-12 VITALS — BP 145/78 | HR 80 | Ht 73.0 in | Wt 226.0 lb

## 2017-06-12 DIAGNOSIS — E1122 Type 2 diabetes mellitus with diabetic chronic kidney disease: Secondary | ICD-10-CM | POA: Diagnosis not present

## 2017-06-12 DIAGNOSIS — I2510 Atherosclerotic heart disease of native coronary artery without angina pectoris: Secondary | ICD-10-CM | POA: Diagnosis not present

## 2017-06-12 DIAGNOSIS — R269 Unspecified abnormalities of gait and mobility: Secondary | ICD-10-CM

## 2017-06-12 DIAGNOSIS — K625 Hemorrhage of anus and rectum: Secondary | ICD-10-CM | POA: Diagnosis not present

## 2017-06-12 DIAGNOSIS — M79604 Pain in right leg: Secondary | ICD-10-CM | POA: Diagnosis not present

## 2017-06-12 DIAGNOSIS — M5412 Radiculopathy, cervical region: Secondary | ICD-10-CM | POA: Diagnosis not present

## 2017-06-12 DIAGNOSIS — D631 Anemia in chronic kidney disease: Secondary | ICD-10-CM | POA: Diagnosis not present

## 2017-06-12 DIAGNOSIS — I132 Hypertensive heart and chronic kidney disease with heart failure and with stage 5 chronic kidney disease, or end stage renal disease: Secondary | ICD-10-CM | POA: Diagnosis not present

## 2017-06-12 DIAGNOSIS — I255 Ischemic cardiomyopathy: Secondary | ICD-10-CM | POA: Diagnosis not present

## 2017-06-12 DIAGNOSIS — E1340 Other specified diabetes mellitus with diabetic neuropathy, unspecified: Secondary | ICD-10-CM | POA: Diagnosis not present

## 2017-06-12 DIAGNOSIS — F0390 Unspecified dementia without behavioral disturbance: Secondary | ICD-10-CM | POA: Diagnosis not present

## 2017-06-12 DIAGNOSIS — I5042 Chronic combined systolic (congestive) and diastolic (congestive) heart failure: Secondary | ICD-10-CM | POA: Diagnosis not present

## 2017-06-12 NOTE — Patient Instructions (Signed)
   We will do EMG of the right leg and get MRI of the neck.

## 2017-06-12 NOTE — Progress Notes (Signed)
Reason for visit: Weakness of all 4 extremities, right leg pain  Referring physician: Dr. Carlyon Prows is a 66 y.o. male  History of present illness:  [name] is a 66 year old left-handed black male with a history of diabetes associated with a severe diabetic peripheral neuropathy. The patient has bilateral foot drops, he has been walking with a walker since he had a right total knee replacement in 2016. The patient has not had any recent falls. The patient comes in primarily for evaluation of right hip and thigh discomfort with pain on occasion going down into the calf area that began about 2 or 3 months ago. The patient has significant discomfort when he flexes at the hip. He denies any overt low back pain. He has had prior lumbosacral spine surgery on 2 occasions, once in 1998 and again in 1999 done by Dr. Trenton Gammon. The patient also complains of left-sided weakness involving the arm and the leg. He has a history of cervical spine surgery again done by Dr. Trenton Gammon in 2008. The patient had severe left arm weakness prior to surgery, the weakness never completely improved, but the patient and his wife indicate that the left arm weakness has progressively worsened over time. The patient was seen by Dr. Delice Lesch on 12/21/2015 primarily for memory problems, but the patient was sent for EMG and nerve conduction study evaluation that was done on 02/22/2016. This study confirmed the presence of a C5 and C6 chronic cervical radiculopathy. The patient has evidence of a significant peripheral neuropathy. The patient has other significant medical issues, he is on hemodialysis for end-stage renal disease, he has recently been in the hospital several times for radiation-induced proctitis with lower GI bleed. The patient has a history of prostate cancer requiring radiation therapy in 2017. The patient has had coronary artery disease with a myocardial infarction in November 2017.    Currently, the right leg pain  is present mainly with walking. He has little discomfort while sitting or lying down. The patient denies any significant discomfort from the peripheral neuropathy, he is able to sleep well at night. He does not produce much urine, he has had some fecal incontinence recently. He is sent to this office for further evaluation.  Past Medical History:  Diagnosis Date  . Allergic rhinitis, cause unspecified 02/24/2014  . Anemia 06/16/2011  . BENIGN PROSTATIC HYPERTROPHY 10/14/2009  . CAD, NATIVE VESSEL 02/06/2009   a. 06/2007 s/p Taxus DES to the RCA;  b. 08/2016 NSTEMI in setting of SVT/PCI: LM 30ost, LAD 95m (3.0x16 Synergy DES), LCX 91m, OM1 60, RI 40, RCA 70p/m, 52m - not amenable to PCI.  [name] Cervical radiculopathy, chronic 02/23/2016   Right c5-6 by NCS/EMG  . CHEST PAIN 03/29/2010  . Chronic combined systolic (congestive) and diastolic (congestive) heart failure ([place])    a. 10/2016 Echo: EF 40-45%, Gr2 DD. mildly dil LA.  [name] COLONIC POLYPS, HX OF 10/14/2009  . Dementia [id number]  . DEPRESSION 10/14/2009  . Depression 09/24/2015  . DIABETES MELLITUS, TYPE II 02/01/2010  . DIZZINESS 07/17/2010  . DYSLIPIDEMIA 06/18/2007  . ESRD (end stage renal disease) on dialysis ([place]) 08/04/2010   "TTS;  " (04/18/2015)  . FOOT PAIN 08/12/2008  . GAIT DISTURBANCE 03/03/2010  . GASTROENTERITIS, VIRAL 10/14/2009  . GERD 06/18/2007  . GOITER, MULTINODULAR 12/26/2007  . GOUT 06/18/2007  . GYNECOMASTIA 07/17/2010  . Hemodialysis access, fistula mature [facility])    Dialysis T-Th-Sa ([place]) Right upper arm  fistula  . Hyperlipidemia 10/16/2011  . Hyperparathyroidism, secondary ([place]) 06/16/2011  . HYPERTENSION 06/18/2007  . Hyperthyroidism   . Hypocalcemia 06/07/2010  . Ischemic cardiomyopathy    a. 10/2016 Echo: EF 40-45%.  [name] NECK PAIN 07/31/2010  . ONYCHOMYCOSIS, TOENAILS 12/26/2007  . OSA on CPAP 10/16/2011  . Other malaise and fatigue 11/24/2009  . PERIPHERAL NEUROPATHY 06/18/2007  . Prostate cancer ([place])     . PSVT (paroxysmal supraventricular tachycardia) ([place])    a. 24/4010 complicated by NSTEMI;  b. 11/2016 Treated w/ adenosine in ED;  c. 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  [name] PULMONARY NODULE, RIGHT LOWER LOBE 06/08/2009  . Sleep apnea    cpap machine and o2  . TRANSAMINASES, SERUM, ELEVATED 02/01/2010  . Transfusion history    none recent  . Unspecified hypotension 01/30/2010    Past Surgical History:  Procedure Laterality Date  . ARTERIOVENOUS GRAFT PLACEMENT Right 2009   forearm/notes 02/01/2011  . AV FISTULA PLACEMENT  11/07/2011   Procedure: INSERTION OF ARTERIOVENOUS (AV) GORE-TEX GRAFT ARM;  Surgeon: Tinnie Gens, MD;  Location: [place];  Service: Vascular;  Laterality: Left;  . BACK SURGERY  1998  . BASCILIC VEIN TRANSPOSITION Right 02/27/2013   Procedure: BASCILIC VEIN TRANSPOSITION;  Surgeon: Mal Misty, MD;  Location: [place];  Service: Vascular;  Laterality: Right;  Right Basilic Vein Transposition   . CARDIAC CATHETERIZATION N/A 08/06/2016   Procedure: Left Heart Cath and Coronary Angiography;  Surgeon: Jolaine Artist, MD;  Location: [place] CV LAB;  Service: Cardiovascular;  Laterality: N/A;  . CARDIAC CATHETERIZATION N/A 08/07/2016   Procedure: Coronary/Graft Atherectomy-CSI LAD;  Surgeon: Peter M Martinique, MD;  Location: [place] CV LAB;  Service: Cardiovascular;  Laterality: N/A;  . CERVICAL SPINE SURGERY  2/09   "to repair nerve problems in my left arm"  . CHOLECYSTECTOMY    . COLONOSCOPY WITH PROPOFOL N/A 04/26/2017   Procedure: COLONOSCOPY WITH PROPOFOL;  Surgeon: Otis Brace, MD;  Location: [place];  Service: Gastroenterology;  Laterality: N/A;  . CORONARY ANGIOPLASTY WITH STENT PLACEMENT  06/11/2008  . CORONARY ANGIOPLASTY WITH STENT PLACEMENT  06/2007   TAXUS stent to RCA/notes 01/31/2011  . ESOPHAGOGASTRODUODENOSCOPY  09/28/2011   Procedure: ESOPHAGOGASTRODUODENOSCOPY (EGD);  Surgeon: Missy Sabins, MD;  Location: [facility] ENDOSCOPY;  Service: Endoscopy;  Laterality: N/A;   . ESOPHAGOGASTRODUODENOSCOPY N/A 04/07/2015   Procedure: ESOPHAGOGASTRODUODENOSCOPY (EGD);  Surgeon: Teena Irani, MD;  Location: [facility];  Service: Endoscopy;  Laterality: N/A;  . ESOPHAGOGASTRODUODENOSCOPY N/A 04/19/2015   Procedure: ESOPHAGOGASTRODUODENOSCOPY (EGD);  Surgeon: Arta Silence, MD;  [facility];  Service: Endoscopy;  Laterality: N/A;  . FLEXIBLE SIGMOIDOSCOPY N/A 05/21/2017   Procedure: Conni Elliot;  Surgeon: Clarene Essex, MD;  Location: [place];  Service: Endoscopy;  Laterality: N/A;  . FOREIGN BODY REMOVAL  09/2003   via upper endoscopy/notes 02/12/2011  . GIVENS CAPSULE STUDY  09/30/2011   Procedure: GIVENS CAPSULE STUDY;  Surgeon: Jeryl Columbia, MD;  Location: [facility] ENDOSCOPY;  Service: Endoscopy;  Laterality: N/A;  . INSERTION OF DIALYSIS CATHETER Right 2014  . INSERTION OF DIALYSIS CATHETER Left 02/11/2013   Procedure: INSERTION OF DIALYSIS CATHETER;  Surgeon: Conrad [place], MD;  Location: [place];  Service: Vascular;  Laterality: Left;  Ultrasound guided  . REMOVAL OF A DIALYSIS CATHETER Right 02/11/2013   Procedure: REMOVAL OF A DIALYSIS CATHETER;  Surgeon: Conrad Amherst, MD;  Location: [place];  Service: Vascular;  Laterality: Right;  . SAVORY DILATION N/A 04/07/2015  Procedure: SAVORY DILATION;  Surgeon: Teena Irani, MD;  Location: [facility] ENDOSCOPY;  Service: Endoscopy;  Laterality: N/A;  . SHUNTOGRAM N/A 09/20/2011   Procedure: Earney Mallet;  Surgeon: Conrad Morgan, MD;  Location: [facility] CATH LAB;  Service: Cardiovascular;  Laterality: N/A;  . SVT ABLATION N/A 11/26/2016   Procedure: SVT Ablation;  Surgeon: Will Meredith Leeds, MD;  Location: [place] CV LAB;  Service: Cardiovascular;  Laterality: N/A;  . TONSILLECTOMY    . TOTAL KNEE ARTHROPLASTY Right 08/02/2015   Procedure: TOTAL KNEE ARTHROPLASTY;  Surgeon: Renette Butters, MD;  Location: [place];  Service: Orthopedics;  Laterality: Right;  . VENOGRAM N/A 01/26/2013   Procedure: VENOGRAM;  Surgeon:  Angelia Mould, MD;  Location: [facility] CATH LAB;  Service: Cardiovascular;  Laterality: N/A;    Family History  Problem Relation Age of Onset  . Heart disease Sister   . Thyroid nodules Sister   . Heart disease Father   . Diabetes Father   . Kidney failure Father   . Hypertension Father   . Healthy Child   . Healthy Child   . Healthy Child   . Cancer Neg Hx     Social history:  reports that he quit smoking about 11 years ago. His smoking use included Cigarettes. He has a 25.00 pack-year smoking history. He quit smokeless tobacco use about 11 years ago. He reports that he does not drink alcohol or use drugs.  Medications:  Prior to Admission medications   Medication Sig Start Date End Date Taking? Authorizing Provider  acetaminophen (TYLENOL) 325 MG tablet Take 650 mg by mouth every 6 (six) hours as needed for mild pain or headache.    Yes [provider]  allopurinol (ZYLOPRIM) 100 MG tablet Take 1 tablet (100 mg total) by mouth daily. 07/20/16  Yes Biagio Borg, MD  citalopram (CELEXA) 10 MG tablet Take 10 mg by mouth daily as needed (anxiety).    Yes [provider]  clopidogrel (PLAVIX) 75 MG tablet Please hold plavix for a week, please see GI Dr Watt Climes in a week to discuss resuming plavix 05/22/17  Yes Florencia Reasons, MD  colesevelam [facility]) 625 MG tablet TAKE THREE TABLETS BY MOUTH TWICE DAILY WITH MEALS Patient taking differently: Take 1,875 mg by mouth 2 (two) times daily with a meal.  07/20/16  Yes Biagio Borg, MD  ezetimibe (ZETIA) 10 MG tablet TAKE ONE TABLET BY MOUTH ONCE DAILY Patient taking differently: TAKE 10 MG BY MOUTH ONCE DAILY 04/08/17  Yes Bensimhon, Shaune Pascal, MD  gabapentin (NEURONTIN) 300 MG capsule 1-2 tabs by mouth at bedtime for sleep and pain Patient taking differently: Take 600 mg by mouth at bedtime.  07/20/16  Yes Biagio Borg, MD  HYDROcodone-acetaminophen (NORCO/VICODIN) 5-325 MG tablet Take 1 tablet by mouth every 6 (six) hours as  needed for moderate pain.   Yes [provider]  hydroxypropyl methylcellulose / hypromellose (ISOPTO TEARS / GONIOVISC) 2.5 % ophthalmic solution Place 1 drop into both eyes 3 (three) times daily as needed for dry eyes. 05/03/17  Yes Biagio Borg, MD  Lanthanum Carbonate 1000 MG PACK Take 3,000 mg by mouth 2 (two) times daily.    Yes [provider]  methimazole (TAPAZOLE) 5 MG tablet Take 1 tablet (5 mg total) by mouth daily. 10/24/16  Yes Renato Shin, MD  metoprolol tartrate (LOPRESSOR) 25 MG tablet Take 1 tablet (25 mg total) by mouth 2 (two) times daily. ON NON-HD DAYS 12/26/16  Yes  Camnitz, Will Hassell Done, MD  midodrine (PROAMATINE) 10 MG tablet Take 1 tablet (10 mg total) by mouth See admin instructions. Once a day only on dialysis days (Tues/Thurs/Sat) 10/18/16  Yes Regalado, Belkys A, MD  multivitamin (RENA-VIT) TABS tablet Take 1 tablet by mouth at bedtime. Reported on 10/24/2015   Yes [provider]  pantoprazole (PROTONIX) 40 MG tablet Take 40 mg by mouth every evening.  08/31/16  Yes [provider]  polyethylene glycol (MIRALAX / GLYCOLAX) packet Take 17 g by mouth daily. 05/23/17  Yes Florencia Reasons, MD  SENSIPAR 90 MG tablet Take 90 mg by mouth 2 (two) times daily. 09/18/16  Yes [provider]      Allergies  Allergen Reactions  . Cephalexin Swelling and Other (See Comments)    Tongue swelling  . Statins Other (See Comments)    Weak muscles  . Ciprofloxacin Rash    ROS:  Out of a complete 14 system review of symptoms, the patient complains only of the following symptoms, and all other reviewed systems are negative.  Blurred vision, eye pain Blood in the stool Easy bruising Joint pain, achy muscles Weakness Decreased energy, change in appetite  Blood pressure (!) 145/78, pulse 80, height 6\' 1"  (1.854 m), weight 226 lb (102.5 kg).  Physical Exam  General: The patient is alert and cooperative at the time of the examination. The  patient is moderately obese.  Eyes: Pupils are equal, round, and reactive to light. Discs are flat bilaterally. Bilateral cataracts are seen.  Neck: The neck is supple, no carotid bruits are noted.  Respiratory: The respiratory examination is clear.  Cardiovascular: The cardiovascular examination reveals a regular rate and rhythm, no obvious murmurs or rubs are noted.  Skin: Extremities are with 1+ edema of ankles bilaterally.  Neurologic Exam  Mental status: The patient is alert and oriented x 3 at the time of the examination. The patient has apparent normal recent and remote memory, with an apparently normal attention span and concentration ability.  Cranial nerves: Facial symmetry is present. There is good sensation of the face to pinprick and soft touch bilaterally. The strength of the facial muscles and the muscles to head turning and shoulder shrug are normal bilaterally. Speech is well enunciated, no aphasia or dysarthria is noted. Extraocular movements are full. Visual fields are full. The tongue is midline, and the patient has symmetric elevation of the soft palate. No obvious hearing deficits are noted.  Motor: The motor testing reveals 4/5 strength of the intrinsic muscles of the hands bilaterally. The patient has 3/5 strength with external rotation of the left arm, abduction of the left arm, external rotation of left arm, and with supination of the left arm. Mild weakness is seen with supination of the right arm, otherwise normal strength. The patient has giveaway weakness with hip flexion on the right, good strength on the left, prominent bilateral foot drops are seen. Good strength is seen with flexion and extension of the knees. Good symmetric motor tone is noted throughout.  Sensory: Sensory testing is intact to pinprick, soft touch, vibration sensation, and position sense on the upper extremities. With the lower extremities, there appears to be a stocking pattern pinprick sensory  deficit across the knees bilaterally. There is some impairment of vibration sensation in both feet, and severe impairment of position sense in both feet. No evidence of extinction is noted.  Coordination: Cerebellar testing reveals good finger-nose-finger and heel-to-shin bilaterally.  Gait and station: Gait is wide-based. The  patient walks with a walker, the patient has a bilateral steppage gait pattern. Tandem gait was not attempted. Romberg is positive.  Reflexes: Deep tendon reflexes are symmetric, but are depressed bilaterally. Toes are downgoing bilaterally.   EMG and NCV 02/21/17:  NCV & EMG Findings: Extensive electrodiagnostic testing of the left upper and lower extremity shows: 1. Left median sensory nerve showed prolonged distal peak latency (5.1 ms) and reduced amplitude (7.5 V).  Left radial sensory responses show reduced amplitude (6.5 V).  Left ulnar sensory response is absent. 2. Left median an ulnar motor responses show prolonged distal onset latency (4.7, 3.5 ms) and normal amplitude. There is also mild conduction velocity slowing across the ulnar nerve (48 m/s). 3. Left sural and superficial peroneal sensory responses are absent. 4. Left peroneal (EDB) and tibial motor responses are absent. Left peroneal motor response recording at the tibialis anterior shows reduced amplitude. 5. In the left upper extremity, chronic motor axon loss changes are seen affecting all the tested muscles with more severe changes involving the C5-6 myotomes. There is no evidence of active denervation. 6. In the left lower extremity, chronic motor axon loss changes are seen affecting all the tested muscles and follow a gradient pattern where it is worse distally.  There is no evidence of active denervation.  Impression: 1. The electrophysiologic findings are most consistent with a generalized sensorimotor polyneuropathy, predominantly axon loss in type, affecting the left upper and lower  extremities. Overall, these findings are severe in degree electrically. 2. There is also evidence of a superimposed left C5-C6 radiculopathy, moderate to severe in degree electrically. 3. Multilevel lumbosacral radiculopathies affecting the L3-S1 nerve roots cannot be excluded.  Assessment/Plan:  1. Diabetes with diabetic peripheral neuropathy  2. End-stage renal disease   3. Chronic left C5 radiculopathy, severe  4. New onset right hip and leg pain   5. Bilateral foot drops  6. Gait disorder  7. Memory disorder   The patient has a complex medical history, he has had cervical spine and lumbosacral spine surgery previously. He has a residual severe left C5 radiculopathy but he claims that the left arm and left leg are getting weaker over time. For this reason, MRI of the cervical spine will be done. The patient has new onset right hip and leg pain, nerve conduction studies of both legs and EMG of the right leg will be done. We may consider MRI of the lumbar spine depending upon the results of the above study. The patient has bilateral foot drops, he may benefit from AFO braces. The patient currently is getting home health physical therapy. He will follow-up for the EMG evaluation.     Jill Alexanders MD 06/12/2017 8:58 AM  [facility] [address] [place], [place] [address]  Phone [phone] Fax [phone]

## 2017-06-13 ENCOUNTER — Ambulatory Visit: Payer: Medicare Other | Admitting: Neurology

## 2017-06-13 DIAGNOSIS — Z23 Encounter for immunization: Secondary | ICD-10-CM | POA: Diagnosis not present

## 2017-06-13 DIAGNOSIS — N2581 Secondary hyperparathyroidism of renal origin: Secondary | ICD-10-CM | POA: Diagnosis not present

## 2017-06-13 DIAGNOSIS — E119 Type 2 diabetes mellitus without complications: Secondary | ICD-10-CM | POA: Diagnosis not present

## 2017-06-13 DIAGNOSIS — D509 Iron deficiency anemia, unspecified: Secondary | ICD-10-CM | POA: Diagnosis not present

## 2017-06-13 DIAGNOSIS — N186 End stage renal disease: Secondary | ICD-10-CM | POA: Diagnosis not present

## 2017-06-13 DIAGNOSIS — D631 Anemia in chronic kidney disease: Secondary | ICD-10-CM | POA: Diagnosis not present

## 2017-06-14 ENCOUNTER — Other Ambulatory Visit: Payer: Self-pay

## 2017-06-14 DIAGNOSIS — D631 Anemia in chronic kidney disease: Secondary | ICD-10-CM | POA: Diagnosis not present

## 2017-06-14 DIAGNOSIS — I5042 Chronic combined systolic (congestive) and diastolic (congestive) heart failure: Secondary | ICD-10-CM | POA: Diagnosis not present

## 2017-06-14 DIAGNOSIS — K625 Hemorrhage of anus and rectum: Secondary | ICD-10-CM | POA: Diagnosis not present

## 2017-06-14 DIAGNOSIS — I132 Hypertensive heart and chronic kidney disease with heart failure and with stage 5 chronic kidney disease, or end stage renal disease: Secondary | ICD-10-CM | POA: Diagnosis not present

## 2017-06-14 DIAGNOSIS — I2510 Atherosclerotic heart disease of native coronary artery without angina pectoris: Secondary | ICD-10-CM | POA: Diagnosis not present

## 2017-06-14 DIAGNOSIS — E1122 Type 2 diabetes mellitus with diabetic chronic kidney disease: Secondary | ICD-10-CM | POA: Diagnosis not present
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
## 2017-06-15 DIAGNOSIS — D631 Anemia in chronic kidney disease: Secondary | ICD-10-CM | POA: Diagnosis not present

## 2017-06-15 DIAGNOSIS — D509 Iron deficiency anemia, unspecified: Secondary | ICD-10-CM | POA: Diagnosis not present

## 2017-06-15 DIAGNOSIS — N2581 Secondary hyperparathyroidism of renal origin: Secondary | ICD-10-CM | POA: Diagnosis not present

## 2017-06-15 DIAGNOSIS — Z23 Encounter for immunization: Secondary | ICD-10-CM | POA: Diagnosis not present

## 2017-06-15 DIAGNOSIS — E119 Type 2 diabetes mellitus without complications: Secondary | ICD-10-CM | POA: Diagnosis not present

## 2017-06-15 DIAGNOSIS — N186 End stage renal disease: Secondary | ICD-10-CM | POA: Diagnosis not present

## 2017-06-17 DIAGNOSIS — K625 Hemorrhage of anus and rectum: Secondary | ICD-10-CM | POA: Diagnosis not present

## 2017-06-17 DIAGNOSIS — I132 Hypertensive heart and chronic kidney disease with heart failure and with stage 5 chronic kidney disease, or end stage renal disease: Secondary | ICD-10-CM | POA: Diagnosis not present

## 2017-06-17 DIAGNOSIS — I2510 Atherosclerotic heart disease of native coronary artery without angina pectoris: Secondary | ICD-10-CM | POA: Diagnosis not present

## 2017-06-17 DIAGNOSIS — E1122 Type 2 diabetes mellitus with diabetic chronic kidney disease: Secondary | ICD-10-CM | POA: Diagnosis not present

## 2017-06-17 DIAGNOSIS — I5042 Chronic combined systolic (congestive) and diastolic (congestive) heart failure: Secondary | ICD-10-CM | POA: Diagnosis not present

## 2017-06-17 DIAGNOSIS — D631 Anemia in chronic kidney disease: Secondary | ICD-10-CM | POA: Diagnosis not present

## 2017-06-18 DIAGNOSIS — N186 End stage renal disease: Secondary | ICD-10-CM | POA: Diagnosis not present

## 2017-06-18 DIAGNOSIS — Z23 Encounter for immunization: Secondary | ICD-10-CM | POA: Diagnosis not present

## 2017-06-18 DIAGNOSIS — E119 Type 2 diabetes mellitus without complications: Secondary | ICD-10-CM | POA: Diagnosis not present

## 2017-06-18 DIAGNOSIS — D631 Anemia in chronic kidney disease: Secondary | ICD-10-CM | POA: Diagnosis not present

## 2017-06-18 DIAGNOSIS — D509 Iron deficiency anemia, unspecified: Secondary | ICD-10-CM | POA: Diagnosis not present

## 2017-06-18 DIAGNOSIS — N2581 Secondary hyperparathyroidism of renal origin: Secondary | ICD-10-CM | POA: Diagnosis not present

## 2017-06-19 DIAGNOSIS — I132 Hypertensive heart and chronic kidney disease with heart failure and with stage 5 chronic kidney disease, or end stage renal disease: Secondary | ICD-10-CM | POA: Diagnosis not present

## 2017-06-19 DIAGNOSIS — I2510 Atherosclerotic heart disease of native coronary artery without angina pectoris: Secondary | ICD-10-CM | POA: Diagnosis not present

## 2017-06-19 DIAGNOSIS — D631 Anemia in chronic kidney disease: Secondary | ICD-10-CM | POA: Diagnosis not present

## 2017-06-19 DIAGNOSIS — K625 Hemorrhage of anus and rectum: Secondary | ICD-10-CM | POA: Diagnosis not present

## 2017-06-19 DIAGNOSIS — E1122 Type 2 diabetes mellitus with diabetic chronic kidney disease: Secondary | ICD-10-CM | POA: Diagnosis not present

## 2017-06-19 DIAGNOSIS — I5042 Chronic combined systolic (congestive) and diastolic (congestive) heart failure: Secondary | ICD-10-CM | POA: Diagnosis not present

## 2017-06-20 DIAGNOSIS — Z23 Encounter for immunization: Secondary | ICD-10-CM | POA: Diagnosis not present

## 2017-06-20 DIAGNOSIS — E119 Type 2 diabetes mellitus without complications: Secondary | ICD-10-CM | POA: Diagnosis not present

## 2017-06-20 DIAGNOSIS — N2581 Secondary hyperparathyroidism of renal origin: Secondary | ICD-10-CM | POA: Diagnosis not present

## 2017-06-20 DIAGNOSIS — N186 End stage renal disease: Secondary | ICD-10-CM | POA: Diagnosis not present

## 2017-06-20 DIAGNOSIS — D631 Anemia in chronic kidney disease: Secondary | ICD-10-CM | POA: Diagnosis not present

## 2017-06-20 DIAGNOSIS — D509 Iron deficiency anemia, unspecified: Secondary | ICD-10-CM | POA: Diagnosis not present

## 2017-06-21 ENCOUNTER — Other Ambulatory Visit: Payer: Self-pay

## 2017-06-21 NOTE — Patient Outreach (Signed)
[facility]) Care Management  06/21/17  [name] 12-31-1950 [id number]  Attempted to reach patient without success. Left HIPAA compliant voicemail with RNCM contact information and invited patient to callback.  [place] R. Haeli Gerlich, RN, BSN, [place] Management Coordinator [phone]

## 2017-06-22 DIAGNOSIS — N2581 Secondary hyperparathyroidism of renal origin: Secondary | ICD-10-CM | POA: Diagnosis not present

## 2017-06-22 DIAGNOSIS — N186 End stage renal disease: Secondary | ICD-10-CM | POA: Diagnosis not present

## 2017-06-22 DIAGNOSIS — D509 Iron deficiency anemia, unspecified: Secondary | ICD-10-CM | POA: Diagnosis not present

## 2017-06-22 DIAGNOSIS — Z23 Encounter for immunization: Secondary | ICD-10-CM | POA: Diagnosis not present

## 2017-06-22 DIAGNOSIS — E119 Type 2 diabetes mellitus without complications: Secondary | ICD-10-CM | POA: Diagnosis not present

## 2017-06-22 DIAGNOSIS — D631 Anemia in chronic kidney disease: Secondary | ICD-10-CM | POA: Diagnosis not present

## 2017-06-22 IMAGING — RF DG UGI W/ HIGH DENSITY W/KUB
11 of 12 series · 12 of 24 positions shown · non-contrast
Comparison: 10/14/2015

CLINICAL DATA: Epigastric pain and burning. Indigestion. Esophageal
dilatation 2 years ago. Nausea and vomiting.

EXAM:
UPPER GI SERIES WITH KUB
TECHNIQUE: After obtaining a scout radiograph a routine upper GI series was
performed using Mckay thin barium
FLUOROSCOPY TIME:  Fluoroscopy Time:  3 minutes, 0 seconds
Radiation Exposure Index (if provided by the fluoroscopic device):
40 mGy
Number of Acquired Spot Images: 0

[Series 2: cp_standard · 0.35mm/px · 1 of 42 frames shown (1 of 11)]
[frame 22/42]
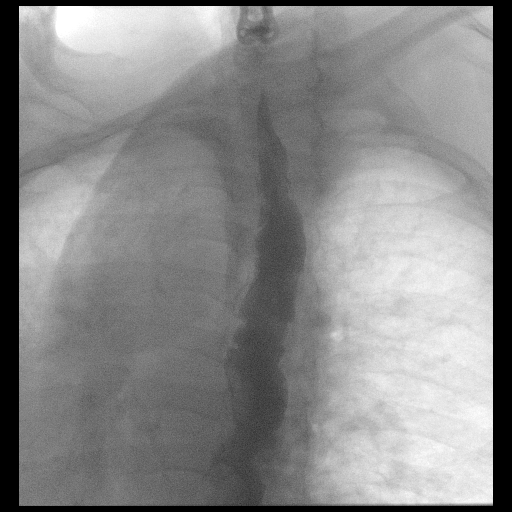

[Series 3: cp_standard · 0.35mm/px · 1 of 43 frames shown (2 of 11)]
[frame 22/43]
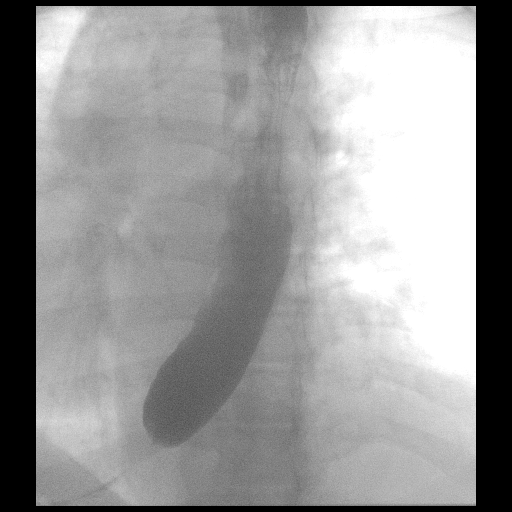

[Series 4: cp_standard · 0.35mm/px · 1 of 49 frames shown (3 of 11)]
[frame 8/49]
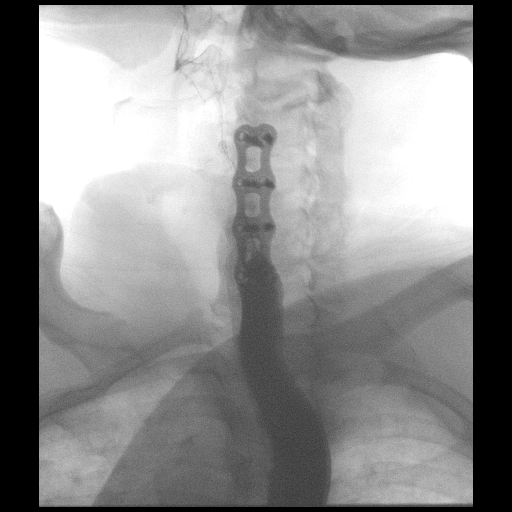

[Series 5: cp_standard · 0.36mm/px · 1 of 51 frames shown (4 of 11)]
[frame 8/51]
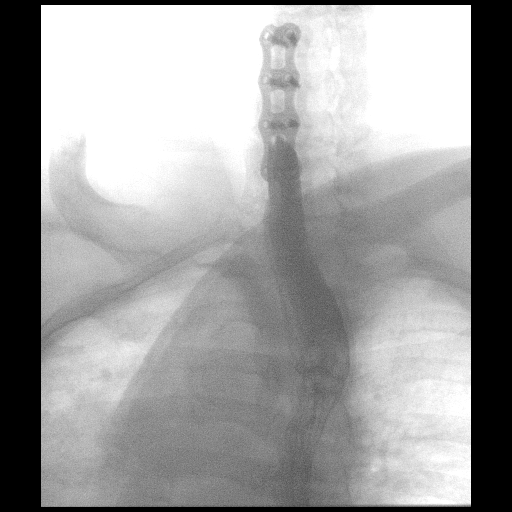

[Series 6: cp_standard · 0.36mm/px · 1 of 69 frames shown (5 of 11)]
[frame 11/69]
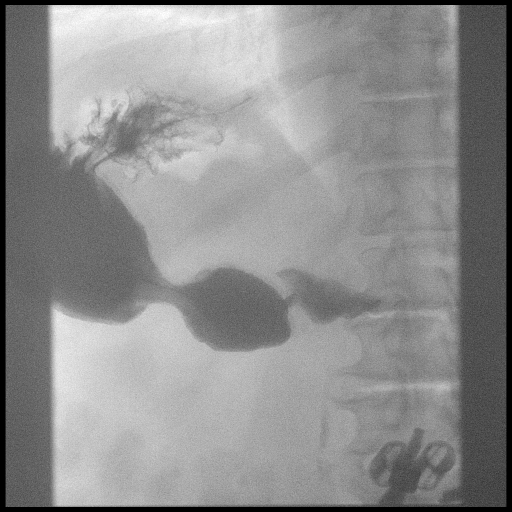

[Series 7: cp_standard · 0.37mm/px · 2 of 64 frames shown (6 of 11)]
[frame 1/64]
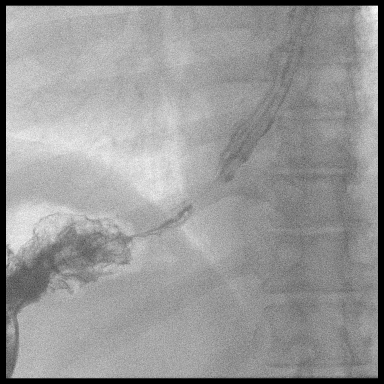
[frame 55/64]
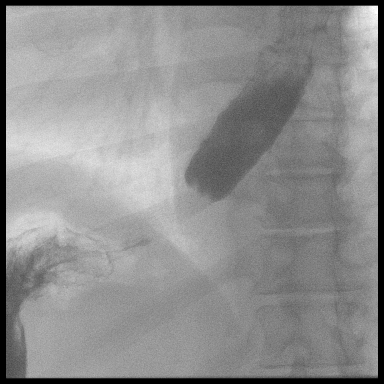

[Series 8: cp_standard · 0.37mm/px · 1 of 34 frames shown (7 of 11)]
[frame 33/34]
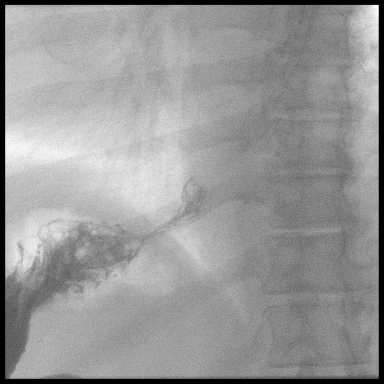

[Series 9: cp_standard · 0.36mm/px · 1 of 94 frames shown (8 of 11)]
[frame 80/94]
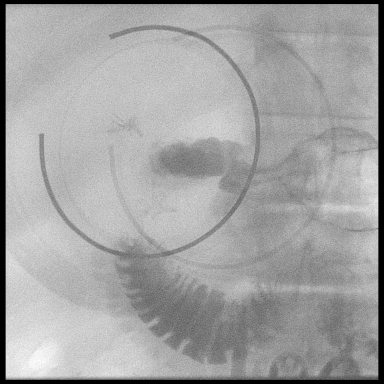

[Series 10: cp_standard · 0.36mm/px · 1 of 31 frames shown (9 of 11)]
[frame 27/31]
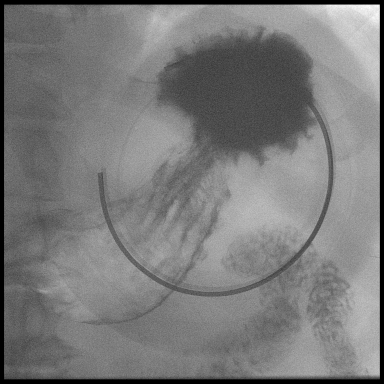

[Series 11: cp_standard · 0.36mm/px · 1 of 18 frames shown (10 of 11)]
[frame 10/18]
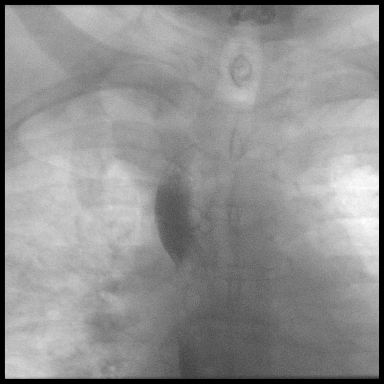

[Series 12: cp_standard · 0.36mm/px · 1 of 25 frames shown (11 of 11)]
[frame 22/25]
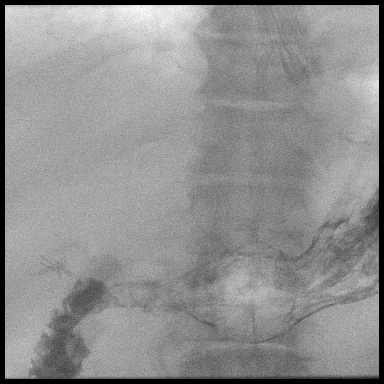

[12 of 24 positions shown; findings below may reference images not displayed]

FINDINGS: Initial spot image of the abdomen demonstrates lumbar spondylosis
and degenerative disc disease with postoperative findings in the
lower lumbar spine. Vascular calcifications noted. Speckled
densities in the colon. Right upper quadrant clips.

The patient has poor mobility and was not able to stand. today' s
exam was performed primarily in the RAO position although some
images were obtained prone for mucosal relief imaging of the
stomach. Normally in upper GI exam involves standing, cord native
movements, and extensive turning ; the patient's inability to
perform these maneuvers reduces diagnostic sensitivity and
specificity compared to a normal upper GI examination.

Initially there were secondary and tertiary contractions in the
esophagus but the primary peristaltic wave was effective. Subsequent
primary peristaltic waves were unremarkable.

Smooth distal esophageal stricturing identified, distal esophageal
caliber 10 mm, although later a 13 mm barium tablet was observed to
extend down into the stomach. Mucosal relief assessment of the
stomach and proximal duodenum was unremarkable.
IMPRESSION: 1. Smooth stricturing in the distal esophagus. This does not have
the irregular appearance usually associated with malignancy.
Questionable small type 1 hiatal hernia. A 13 mm barium tablet did
pass into the stomach. Endoscopy may be warranted for reassessment
of the smooth stricture.
2. No gastric or duodenal abnormality identified.
3. Reduced sensitivity due to limited positioning and patient's
inability to stand for parts of the exam.
4. Lumbar spondylosis and degenerative disc disease with
postoperative findings in the lower lumbar spine.

## 2017-06-24 ENCOUNTER — Telehealth: Payer: Self-pay | Admitting: Internal Medicine

## 2017-06-24 DIAGNOSIS — E1122 Type 2 diabetes mellitus with diabetic chronic kidney disease: Secondary | ICD-10-CM | POA: Diagnosis not present

## 2017-06-24 DIAGNOSIS — K625 Hemorrhage of anus and rectum: Secondary | ICD-10-CM | POA: Diagnosis not present

## 2017-06-24 DIAGNOSIS — I5042 Chronic combined systolic (congestive) and diastolic (congestive) heart failure: Secondary | ICD-10-CM | POA: Diagnosis not present

## 2017-06-24 DIAGNOSIS — I2510 Atherosclerotic heart disease of native coronary artery without angina pectoris: Secondary | ICD-10-CM | POA: Diagnosis not present

## 2017-06-24 DIAGNOSIS — D631 Anemia in chronic kidney disease: Secondary | ICD-10-CM | POA: Diagnosis not present

## 2017-06-24 DIAGNOSIS — I132 Hypertensive heart and chronic kidney disease with heart failure and with stage 5 chronic kidney disease, or end stage renal disease: Secondary | ICD-10-CM | POA: Diagnosis not present

## 2017-06-24 NOTE — Telephone Encounter (Signed)
Arena for verbal and med change

## 2017-06-24 NOTE — Telephone Encounter (Signed)
[name] has been informed.

## 2017-06-24 NOTE — Telephone Encounter (Signed)
Skilled nursing Verbal orders:  1 x a week for 4 weeks  gabapentin (NEURONTIN) 300 MG capsule   Wanted to know if the dose could be changed to 2 x a day not 2x at bed time.  Due to his pain level that seems to work better.   [phone] Lattie Haw

## 2017-06-25 ENCOUNTER — Telehealth: Payer: Self-pay

## 2017-06-25 DIAGNOSIS — D509 Iron deficiency anemia, unspecified: Secondary | ICD-10-CM | POA: Diagnosis not present

## 2017-06-25 DIAGNOSIS — Z23 Encounter for immunization: Secondary | ICD-10-CM | POA: Diagnosis not present

## 2017-06-25 DIAGNOSIS — D631 Anemia in chronic kidney disease: Secondary | ICD-10-CM | POA: Diagnosis not present

## 2017-06-25 DIAGNOSIS — E119 Type 2 diabetes mellitus without complications: Secondary | ICD-10-CM | POA: Diagnosis not present

## 2017-06-25 DIAGNOSIS — N2581 Secondary hyperparathyroidism of renal origin: Secondary | ICD-10-CM | POA: Diagnosis not present

## 2017-06-25 DIAGNOSIS — N186 End stage renal disease: Secondary | ICD-10-CM | POA: Diagnosis not present

## 2017-06-25 NOTE — Telephone Encounter (Signed)
Ok for verbal 

## 2017-06-25 NOTE — Telephone Encounter (Signed)
2 wk hold on PT due to pain.   Joelene Millin with [facility] is requesting verbal okay to hold.   Verbal okay given to hold. Joelene Millin will go back in 2 wks and reassess patient.

## 2017-06-26 ENCOUNTER — Ambulatory Visit (INDEPENDENT_AMBULATORY_CARE_PROVIDER_SITE_OTHER): Payer: Medicare Other | Admitting: Neurology

## 2017-06-26 ENCOUNTER — Ambulatory Visit (INDEPENDENT_AMBULATORY_CARE_PROVIDER_SITE_OTHER): Payer: Self-pay | Admitting: Neurology

## 2017-06-26 ENCOUNTER — Encounter: Payer: Self-pay | Admitting: Neurology

## 2017-06-26 DIAGNOSIS — M79604 Pain in right leg: Secondary | ICD-10-CM | POA: Diagnosis not present

## 2017-06-26 DIAGNOSIS — E1340 Other specified diabetes mellitus with diabetic neuropathy, unspecified: Secondary | ICD-10-CM

## 2017-06-26 NOTE — Procedures (Signed)
     HISTORY:  [name] is a 66 year old gentleman with a history of diabetes and end-stage renal disease with prior lumbosacral spine surgery. The patient is being evaluated for significant back pain and right posterior thigh pain that occurs with standing. The patient has a significant diabetic neuropathy as well, he has bilateral foot drops.  NERVE CONDUCTION STUDIES:  Nerve conduction studies were performed on both lower extremities. No response was seen for the peroneal or posterior tibial nerves bilaterally. The sensory latencies for the peroneal nerves were unobtainable bilaterally and the H reflex latencies were unobtainable bilaterally.  EMG STUDIES:  EMG study was performed on the right lower extremity:  The tibialis anterior muscle reveals 2 to 6K motor units with decreased recruitment. No fibrillations or positive waves were seen. The peroneus tertius muscle reveals up to 10K motor units with significantly decreased recruitment. No fibrillations or positive waves were seen. The medial gastrocnemius muscle reveals up to 7K motor units with decreased recruitment. No fibrillations or positive waves were seen. The vastus lateralis muscle reveals 2 to 4K motor units with slightly decreased recruitment. No fibrillations or positive waves were seen. The iliopsoas muscle reveals 2 to 4K motor units with full recruitment. No fibrillations or positive waves were seen. The biceps femoris muscle (long head) reveals 2 to 4K motor units with decreased recruitment. No fibrillations or positive waves were seen. The lumbosacral paraspinal muscles were tested at 3 levels, and revealed no abnormalities of insertional activity at all 3 levels tested. There was good relaxation.   IMPRESSION:  Nerve conduction studies done on both lower extremities reveal evidence of a severe end-stage peripheral neuropathy. EMG evaluation of the right lower extremity shows chronic stable distal signs of  denervation consistent with the diagnosis of peripheral neuropathy. There is no evidence of an active overlying lumbosacral radiculopathy.  Jill Alexanders MD 06/26/2017 1:46 PM  [facility] [address] [place], [place] [address]  Phone [phone] Fax [phone]

## 2017-06-26 NOTE — Progress Notes (Addendum)
The patient comes in today for EMG nerve conduction study evaluation. The study shows evidence of a severe end-stage peripheral neuropathy, likely secondary to diabetes. The patient has no clear evidence of an active overlying lumbosacral radiculopathy on EMG of the right leg.  The patient mainly is complaining of discomfort in the right leg and back that comes on primarily with weightbearing. The patient will be sent for MRI of the lumbosacral spine, he may require a pain center referral if no surgically amenable problems are noted.  The patient will be having MRI of the cervical spine in the near future secondary to progressive weakness on the right arm and right leg according to the patient.  The patient had been in physical therapy but the right leg and back pain was exacerbated by therapy and the physical therapist has stopped therapy for now.   [place]    Nerve / Sites Muscle Latency Ref. Amplitude Ref. Rel Amp Segments Distance Ref. Area    ms ms mV mV %  cm m/s mVms  L Peroneal - EDB     Ankle EDB NR ?6.5 NR ?2.0 NR Ankle - EDB 9  NR     Fib head EDB NR  NR  NR Fib head - Ankle  ?44 NR     Pop fossa EDB NR  NR  NR Pop fossa - Fib head  ?44 NR         Pop fossa - Ankle     R Peroneal - EDB     Ankle EDB NR ?6.5 NR ?2.0 NR Ankle - EDB 9  NR     Fib head EDB NR  NR  NR Fib head - Ankle  ?44 NR     Pop fossa EDB NR  NR  NR Pop fossa - Fib head  ?44 NR         Pop fossa - Ankle     L Tibial - AH     Ankle AH NR ?5.8 NR ?4.0 NR Ankle - AH 9  NR     Pop fossa AH NR  NR  NR Pop fossa - Ankle  ?41 NR  R Tibial - AH     Ankle AH NR ?5.8 NR ?4.0 NR Ankle - AH 9  NR     Pop fossa AH NR  NR  NR Pop fossa - Ankle  ?[place]    Nerve / Sites Rec. Site Peak Lat Ref.  Amp Ref. Segments Distance    ms ms V V  cm  R Superficial peroneal - Ankle     Lat leg Ankle NR ?4.4 NR ?6 Lat leg - Ankle 14  L Superficial peroneal - Ankle     Lat leg Ankle NR ?4.4 NR ?6 Lat leg - Ankle 14        H Reflex    Nerve H Lat Lat Hmax   ms ms   Left Right Ref. Left Right Ref.  Tibial - [place] NR NR ?35.0 NR NR ?35.0

## 2017-06-26 NOTE — Progress Notes (Signed)
Please refer EMG and nerve conduction study procedure note. 

## 2017-06-27 DIAGNOSIS — E119 Type 2 diabetes mellitus without complications: Secondary | ICD-10-CM | POA: Diagnosis not present

## 2017-06-27 DIAGNOSIS — D509 Iron deficiency anemia, unspecified: Secondary | ICD-10-CM | POA: Diagnosis not present

## 2017-06-27 DIAGNOSIS — N2581 Secondary hyperparathyroidism of renal origin: Secondary | ICD-10-CM | POA: Diagnosis not present

## 2017-06-27 DIAGNOSIS — D631 Anemia in chronic kidney disease: Secondary | ICD-10-CM | POA: Diagnosis not present

## 2017-06-27 DIAGNOSIS — Z23 Encounter for immunization: Secondary | ICD-10-CM | POA: Diagnosis not present

## 2017-06-27 DIAGNOSIS — N186 End stage renal disease: Secondary | ICD-10-CM | POA: Diagnosis not present

## 2017-06-28 ENCOUNTER — Ambulatory Visit
Admission: RE | Admit: 2017-06-28 | Discharge: 2017-06-28 | Disposition: A | Discharge status: Home / Self Care | Payer: Medicare Other | Source: Ambulatory Visit | Attending: Neurology | Admitting: Neurology

## 2017-06-28 DIAGNOSIS — M4802 Spinal stenosis, cervical region: Secondary | ICD-10-CM | POA: Diagnosis not present

## 2017-06-28 DIAGNOSIS — M5412 Radiculopathy, cervical region: Secondary | ICD-10-CM

## 2017-06-28 DIAGNOSIS — M79604 Pain in right leg: Secondary | ICD-10-CM

## 2017-06-28 DIAGNOSIS — M48061 Spinal stenosis, lumbar region without neurogenic claudication: Secondary | ICD-10-CM | POA: Diagnosis not present

## 2017-06-29 ENCOUNTER — Telehealth: Payer: Self-pay | Admitting: Neurology

## 2017-06-29 DIAGNOSIS — D631 Anemia in chronic kidney disease: Secondary | ICD-10-CM | POA: Diagnosis not present

## 2017-06-29 DIAGNOSIS — D509 Iron deficiency anemia, unspecified: Secondary | ICD-10-CM | POA: Diagnosis not present

## 2017-06-29 DIAGNOSIS — M48062 Spinal stenosis, lumbar region with neurogenic claudication: Secondary | ICD-10-CM

## 2017-06-29 DIAGNOSIS — E119 Type 2 diabetes mellitus without complications: Secondary | ICD-10-CM | POA: Diagnosis not present

## 2017-06-29 DIAGNOSIS — Z23 Encounter for immunization: Secondary | ICD-10-CM | POA: Diagnosis not present

## 2017-06-29 DIAGNOSIS — N186 End stage renal disease: Secondary | ICD-10-CM | POA: Diagnosis not present

## 2017-06-29 DIAGNOSIS — N2581 Secondary hyperparathyroidism of renal origin: Secondary | ICD-10-CM | POA: Diagnosis not present

## 2017-06-29 NOTE — Telephone Encounter (Signed)
I calleed the patient. The MRI of the cervical spine does not show compression of the left C5 nerve root. MRI of the lumbar spine shows significant spinal stenosis at the L2-3 level. The patient is having significant pain. I will write a Rx for hydrocodone and get a referral to Dr. Trenton Gammon for evaluation of the low back.   MRI cervical 06/28/17:  IMPRESSION:  This MRI of the cervical spine without contrast shows the following: 1.    ACDF at C3 - C6 2.    Moderately severe spinal stenosis at C2C3 associated with severe right foraminal narrowing that could lead to right C3 nerve root compression. The spinal and foraminal stenosis at this level have progressed with compared to the 2009 MRI. 3.    At C6-C7 there is mild spinal stenosis due to a right paramedian disc protrusion. There is no nerve root compression.   MRI lumbar 06/28/17:  IMPRESSION:  This MRI of the lumbar spine shows severe multilevel degenerative changes as detailed above. There has been previous fusion at L3-L4 and L4-L5.    There is potential for compression of the L1 nerve roots and the right L2 nerve root at L1-L2. There is also probable compression of the L3 nerve roots at L2-L3 and possible compression of the left S1 nerve root at L5-S1. There is severe spinal stenosis at L2-L3 and moderate spinal stenosis at L3-L4 and mild spinal stenosis at L1-L2. All 3 of these levels have progressed when compared to the previous MRI. There is also progression of the facet hypertrophy at L5-S1.

## 2017-06-30 DIAGNOSIS — N186 End stage renal disease: Secondary | ICD-10-CM | POA: Diagnosis not present

## 2017-06-30 DIAGNOSIS — Z992 Dependence on renal dialysis: Secondary | ICD-10-CM | POA: Diagnosis not present

## 2017-06-30 DIAGNOSIS — E1129 Type 2 diabetes mellitus with other diabetic kidney complication: Secondary | ICD-10-CM | POA: Diagnosis not present

## 2017-07-01 ENCOUNTER — Inpatient Hospital Stay (HOSPITAL_COMMUNITY)
Admission: EM | Admit: 2017-07-01 | Discharge: 2017-07-05 | DRG: 393 | Disposition: A | Discharge status: Home / Self Care | Payer: Medicare Other | Attending: Internal Medicine | Admitting: Internal Medicine

## 2017-07-01 ENCOUNTER — Encounter (HOSPITAL_COMMUNITY): Payer: Self-pay | Admitting: Emergency Medicine

## 2017-07-01 DIAGNOSIS — K627 Radiation proctitis: Secondary | ICD-10-CM | POA: Diagnosis present

## 2017-07-01 DIAGNOSIS — E8889 Other specified metabolic disorders: Secondary | ICD-10-CM | POA: Diagnosis present

## 2017-07-01 DIAGNOSIS — G4733 Obstructive sleep apnea (adult) (pediatric): Secondary | ICD-10-CM | POA: Diagnosis present

## 2017-07-01 DIAGNOSIS — Z8249 Family history of ischemic heart disease and other diseases of the circulatory system: Secondary | ICD-10-CM

## 2017-07-01 DIAGNOSIS — F329 Major depressive disorder, single episode, unspecified: Secondary | ICD-10-CM | POA: Diagnosis present

## 2017-07-01 DIAGNOSIS — N186 End stage renal disease: Secondary | ICD-10-CM

## 2017-07-01 DIAGNOSIS — K625 Hemorrhage of anus and rectum: Secondary | ICD-10-CM

## 2017-07-01 DIAGNOSIS — Z86010 Personal history of colonic polyps: Secondary | ICD-10-CM | POA: Diagnosis not present

## 2017-07-01 DIAGNOSIS — I132 Hypertensive heart and chronic kidney disease with heart failure and with stage 5 chronic kidney disease, or end stage renal disease: Secondary | ICD-10-CM | POA: Diagnosis present

## 2017-07-01 DIAGNOSIS — E669 Obesity, unspecified: Secondary | ICD-10-CM | POA: Diagnosis present

## 2017-07-01 DIAGNOSIS — Z955 Presence of coronary angioplasty implant and graft: Secondary | ICD-10-CM | POA: Diagnosis not present

## 2017-07-01 DIAGNOSIS — E1129 Type 2 diabetes mellitus with other diabetic kidney complication: Secondary | ICD-10-CM

## 2017-07-01 DIAGNOSIS — Z96651 Presence of right artificial knee joint: Secondary | ICD-10-CM | POA: Diagnosis not present

## 2017-07-01 DIAGNOSIS — Z992 Dependence on renal dialysis: Secondary | ICD-10-CM

## 2017-07-01 DIAGNOSIS — K626 Ulcer of anus and rectum: Principal | ICD-10-CM | POA: Diagnosis present

## 2017-07-01 DIAGNOSIS — Z6829 Body mass index (BMI) 29.0-29.9, adult: Secondary | ICD-10-CM | POA: Diagnosis not present

## 2017-07-01 DIAGNOSIS — N2581 Secondary hyperparathyroidism of renal origin: Secondary | ICD-10-CM | POA: Diagnosis not present

## 2017-07-01 DIAGNOSIS — D631 Anemia in chronic kidney disease: Secondary | ICD-10-CM | POA: Diagnosis not present

## 2017-07-01 DIAGNOSIS — D62 Acute posthemorrhagic anemia: Secondary | ICD-10-CM | POA: Diagnosis present

## 2017-07-01 DIAGNOSIS — K922 Gastrointestinal hemorrhage, unspecified: Secondary | ICD-10-CM

## 2017-07-01 DIAGNOSIS — Z87891 Personal history of nicotine dependence: Secondary | ICD-10-CM

## 2017-07-01 DIAGNOSIS — I959 Hypotension, unspecified: Secondary | ICD-10-CM | POA: Diagnosis not present

## 2017-07-01 DIAGNOSIS — K921 Melena: Secondary | ICD-10-CM | POA: Diagnosis not present

## 2017-07-01 DIAGNOSIS — K219 Gastro-esophageal reflux disease without esophagitis: Secondary | ICD-10-CM | POA: Diagnosis present

## 2017-07-01 DIAGNOSIS — E1121 Type 2 diabetes mellitus with diabetic nephropathy: Secondary | ICD-10-CM | POA: Diagnosis not present

## 2017-07-01 DIAGNOSIS — Z7902 Long term (current) use of antithrombotics/antiplatelets: Secondary | ICD-10-CM

## 2017-07-01 DIAGNOSIS — E0590 Thyrotoxicosis, unspecified without thyrotoxic crisis or storm: Secondary | ICD-10-CM | POA: Diagnosis present

## 2017-07-01 DIAGNOSIS — E1122 Type 2 diabetes mellitus with diabetic chronic kidney disease: Secondary | ICD-10-CM | POA: Diagnosis present

## 2017-07-01 DIAGNOSIS — D649 Anemia, unspecified: Secondary | ICD-10-CM | POA: Diagnosis not present

## 2017-07-01 DIAGNOSIS — Z8546 Personal history of malignant neoplasm of prostate: Secondary | ICD-10-CM | POA: Diagnosis not present

## 2017-07-01 DIAGNOSIS — R58 Hemorrhage, not elsewhere classified: Secondary | ICD-10-CM | POA: Diagnosis not present

## 2017-07-01 DIAGNOSIS — I120 Hypertensive chronic kidney disease with stage 5 chronic kidney disease or end stage renal disease: Secondary | ICD-10-CM | POA: Diagnosis not present

## 2017-07-01 DIAGNOSIS — R109 Unspecified abdominal pain: Secondary | ICD-10-CM | POA: Diagnosis not present

## 2017-07-01 DIAGNOSIS — E785 Hyperlipidemia, unspecified: Secondary | ICD-10-CM | POA: Diagnosis present

## 2017-07-01 DIAGNOSIS — I5042 Chronic combined systolic (congestive) and diastolic (congestive) heart failure: Secondary | ICD-10-CM | POA: Diagnosis not present

## 2017-07-01 DIAGNOSIS — I2510 Atherosclerotic heart disease of native coronary artery without angina pectoris: Secondary | ICD-10-CM | POA: Diagnosis not present

## 2017-07-01 DIAGNOSIS — Z888 Allergy status to other drugs, medicaments and biological substances status: Secondary | ICD-10-CM

## 2017-07-01 DIAGNOSIS — I110 Hypertensive heart disease with heart failure: Secondary | ICD-10-CM | POA: Diagnosis not present

## 2017-07-01 DIAGNOSIS — Z79899 Other long term (current) drug therapy: Secondary | ICD-10-CM

## 2017-07-01 LAB — HEMOGLOBIN AND HEMATOCRIT, BLOOD
HCT: 33.5 % — ABNORMAL LOW (ref 39.0–52.0)
HCT: 32.8 % — ABNORMAL LOW (ref 39.0–52.0)
HCT: 28.4 % — ABNORMAL LOW (ref 39.0–52.0)
HEMATOCRIT: 35.2 % — AB (ref 39.0–52.0)
HEMOGLOBIN: 10 g/dL — AB (ref 13.0–17.0)
Hemoglobin: 11 g/dL — ABNORMAL LOW (ref 13.0–17.0)
Hemoglobin: 10.1 g/dL — ABNORMAL LOW (ref 13.0–17.0)
Hemoglobin: 9.1 g/dL — ABNORMAL LOW (ref 13.0–17.0)

## 2017-07-01 LAB — CBC
HCT: 38.4 % — ABNORMAL LOW (ref 39.0–52.0)
HEMATOCRIT: 31 % — AB (ref 39.0–52.0)
HEMOGLOBIN: 11.5 g/dL — AB (ref 13.0–17.0)
HEMOGLOBIN: 9.1 g/dL — AB (ref 13.0–17.0)
MCH: 27.8 pg (ref 26.0–34.0)
MCH: 27 pg (ref 26.0–34.0)
MCHC: 29.9 g/dL — AB (ref 30.0–36.0)
MCHC: 29.4 g/dL — ABNORMAL LOW (ref 30.0–36.0)
MCV: 93 fL (ref 78.0–100.0)
MCV: 92 fL (ref 78.0–100.0)
Platelets: 278 10*3/uL (ref 150–400)
Platelets: 258 10*3/uL (ref 150–400)
RBC: 4.13 MIL/uL — ABNORMAL LOW (ref 4.22–5.81)
RBC: 3.37 MIL/uL — AB (ref 4.22–5.81)
RDW: 18.7 % — AB (ref 11.5–15.5)
RDW: 18.6 % — AB (ref 11.5–15.5)
WBC: 7.9 10*3/uL (ref 4.0–10.5)
WBC: 8.9 10*3/uL (ref 4.0–10.5)

## 2017-07-01 LAB — CBG MONITORING, ED
GLUCOSE-CAPILLARY: 162 mg/dL — AB (ref 65–99)
GLUCOSE-CAPILLARY: 138 mg/dL — AB (ref 65–99)
Glucose-Capillary: 126 mg/dL — ABNORMAL HIGH (ref 65–99)

## 2017-07-01 LAB — COMPREHENSIVE METABOLIC PANEL
ALBUMIN: 3.4 g/dL — AB (ref 3.5–5.0)
ALK PHOS: 71 U/L (ref 38–126)
ALT: 13 U/L — ABNORMAL LOW (ref 17–63)
ANION GAP: 14 (ref 5–15)
AST: 14 U/L — AB (ref 15–41)
BILIRUBIN TOTAL: 0.8 mg/dL (ref 0.3–1.2)
BUN: 46 mg/dL — ABNORMAL HIGH (ref 6–20)
CALCIUM: 10.1 mg/dL (ref 8.9–10.3)
CO2: 25 mmol/L (ref 22–32)
Chloride: 101 mmol/L (ref 101–111)
Creatinine, Ser: 10.04 mg/dL — ABNORMAL HIGH (ref 0.61–1.24)
GFR calc Af Amer: 5 mL/min — ABNORMAL LOW (ref >60)
GFR, EST NON AFRICAN AMERICAN: 5 mL/min — AB (ref >60)
GLUCOSE: 164 mg/dL — AB (ref 65–99)
Potassium: 4.7 mmol/L (ref 3.5–5.1)
SODIUM: 140 mmol/L (ref 135–145)
TOTAL PROTEIN: 7 g/dL (ref 6.5–8.1)

## 2017-07-01 LAB — GLUCOSE, CAPILLARY: Glucose-Capillary: 121 mg/dL — ABNORMAL HIGH (ref 65–99)

## 2017-07-01 LAB — PREPARE RBC (CROSSMATCH)

## 2017-07-01 MED ORDER — SODIUM CHLORIDE 0.9 % IV SOLN
250.0000 mL | INTRAVENOUS | Status: DC | PRN
  Administered 2017-07-02: 14:00:00 via INTRAVENOUS

## 2017-07-01 MED ORDER — SODIUM CHLORIDE 0.9% FLUSH
3.0000 mL | Freq: Two times a day (BID) | INTRAVENOUS | Status: DC
  Administered 2017-07-01 – 2017-07-04 (×5): 3 mL via INTRAVENOUS

## 2017-07-01 MED ORDER — MIDODRINE HCL 5 MG PO TABS
10.0000 mg | ORAL_TABLET | ORAL | Status: DC
  Administered 2017-07-02 – 2017-07-04 (×2): 10 mg via ORAL
  Filled 2017-07-01: qty 2

## 2017-07-01 MED ORDER — GABAPENTIN 300 MG PO CAPS
300.0000 mg | ORAL_CAPSULE | Freq: Every day | ORAL | Status: DC
  Administered 2017-07-01 – 2017-07-04 (×4): 300 mg via ORAL
  Filled 2017-07-01 (×4): qty 1

## 2017-07-01 MED ORDER — SODIUM CHLORIDE 0.9% FLUSH: 3.0000 mL | INTRAVENOUS | Status: DC | PRN

## 2017-07-01 MED ORDER — PANTOPRAZOLE SODIUM 40 MG PO TBEC
40.0000 mg | DELAYED_RELEASE_TABLET | Freq: Every evening | ORAL | Status: DC
  Administered 2017-07-01 – 2017-07-04 (×4): 40 mg via ORAL
  Filled 2017-07-01 (×4): qty 1

## 2017-07-01 MED ORDER — EZETIMIBE 10 MG PO TABS
10.0000 mg | ORAL_TABLET | Freq: Every day | ORAL | Status: DC
  Administered 2017-07-01 – 2017-07-05 (×4): 10 mg via ORAL
  Filled 2017-07-01 (×4): qty 1

## 2017-07-01 MED ORDER — METOPROLOL TARTRATE 25 MG PO TABS
25.0000 mg | ORAL_TABLET | ORAL | Status: DC
  Administered 2017-07-01 – 2017-07-05 (×4): 25 mg via ORAL
  Filled 2017-07-01 (×4): qty 1

## 2017-07-01 MED ORDER — HYPROMELLOSE (GONIOSCOPIC) 2.5 % OP SOLN: 1.0000 [drp] | Freq: Three times a day (TID) | OPHTHALMIC | Status: DC | PRN

## 2017-07-01 MED ORDER — POLYETHYLENE GLYCOL 3350 17 G PO PACK
17.0000 g | PACK | Freq: Three times a day (TID) | ORAL | Status: AC
  Administered 2017-07-01 (×2): 17 g via ORAL
  Filled 2017-07-01 (×2): qty 1

## 2017-07-01 MED ORDER — RENA-VITE PO TABS
1.0000 | ORAL_TABLET | Freq: Every day | ORAL | Status: DC
  Administered 2017-07-01 – 2017-07-04 (×4): 1 via ORAL
  Filled 2017-07-01 (×4): qty 1

## 2017-07-01 MED ORDER — POLYETHYLENE GLYCOL 3350 17 G PO PACK: 17.0000 g | PACK | Freq: Every day | ORAL | Status: DC | PRN

## 2017-07-01 MED ORDER — INSULIN ASPART 100 UNIT/ML ~~LOC~~ SOLN
0.0000 [IU] | Freq: Three times a day (TID) | SUBCUTANEOUS | Status: DC
  Administered 2017-07-01 – 2017-07-03 (×3): 1 [IU] via SUBCUTANEOUS
  Filled 2017-07-01: qty 1

## 2017-07-01 MED ORDER — LANTHANUM CARBONATE 500 MG PO CHEW
1000.0000 mg | CHEWABLE_TABLET | Freq: Three times a day (TID) | ORAL | Status: DC
  Administered 2017-07-02 – 2017-07-05 (×7): 1000 mg via ORAL
  Filled 2017-07-01 (×8): qty 2

## 2017-07-01 MED ORDER — METHIMAZOLE 5 MG PO TABS
5.0000 mg | ORAL_TABLET | Freq: Every day | ORAL | Status: DC
  Administered 2017-07-01 – 2017-07-05 (×4): 5 mg via ORAL
  Filled 2017-07-01 (×5): qty 1

## 2017-07-01 MED ORDER — CITALOPRAM HYDROBROMIDE 20 MG PO TABS: 10.0000 mg | ORAL_TABLET | Freq: Every day | ORAL | Status: DC | PRN

## 2017-07-01 MED ORDER — OXYCODONE HCL 5 MG PO TABS: 5.0000 mg | ORAL_TABLET | Freq: Four times a day (QID) | ORAL | 0 refills | Status: DC | PRN

## 2017-07-01 MED ORDER — POLYETHYLENE GLYCOL 3350 17 GM/SCOOP PO POWD: 17.0000 g | Freq: Three times a day (TID) | ORAL | Status: DC

## 2017-07-01 MED ORDER — GABAPENTIN 300 MG PO CAPS: 600.0000 mg | ORAL_CAPSULE | Freq: Every day | ORAL | Status: DC

## 2017-07-01 MED ORDER — MIDODRINE HCL 5 MG PO TABS: 10.0000 mg | ORAL_TABLET | ORAL | Status: DC

## 2017-07-01 MED ORDER — ACETAMINOPHEN 325 MG PO TABS
650.0000 mg | ORAL_TABLET | Freq: Four times a day (QID) | ORAL | Status: DC | PRN
  Administered 2017-07-02: 650 mg via ORAL
  Filled 2017-07-01: qty 2

## 2017-07-01 MED ORDER — RENA-VITE PO TABS
1.0000 | ORAL_TABLET | Freq: Every day | ORAL | Status: DC
  Administered 2017-07-01: 1 via ORAL
  Filled 2017-07-01: qty 1

## 2017-07-01 MED ORDER — COLESEVELAM HCL 625 MG PO TABS
1875.0000 mg | ORAL_TABLET | Freq: Two times a day (BID) | ORAL | Status: DC
  Administered 2017-07-02 – 2017-07-05 (×6): 1875 mg via ORAL
  Filled 2017-07-01 (×9): qty 3

## 2017-07-01 MED ORDER — METOPROLOL TARTRATE 25 MG PO TABS: 25.0000 mg | ORAL_TABLET | Freq: Two times a day (BID) | ORAL | Status: DC

## 2017-07-01 MED ORDER — ALLOPURINOL 100 MG PO TABS
100.0000 mg | ORAL_TABLET | Freq: Every day | ORAL | Status: DC
  Administered 2017-07-01 – 2017-07-05 (×4): 100 mg via ORAL
  Filled 2017-07-01 (×4): qty 1

## 2017-07-01 MED ORDER — ACETAMINOPHEN 650 MG RE SUPP: 650.0000 mg | Freq: Four times a day (QID) | RECTAL | Status: DC | PRN

## 2017-07-01 MED ORDER — CINACALCET HCL 30 MG PO TABS
90.0000 mg | ORAL_TABLET | Freq: Two times a day (BID) | ORAL | Status: DC
  Administered 2017-07-02 – 2017-07-05 (×6): 90 mg via ORAL
  Filled 2017-07-01 (×8): qty 3

## 2017-07-01 MED ORDER — HYDROCODONE-ACETAMINOPHEN 5-325 MG PO TABS
1.0000 | ORAL_TABLET | Freq: Four times a day (QID) | ORAL | Status: DC | PRN
  Administered 2017-07-01 – 2017-07-04 (×5): 1 via ORAL
  Filled 2017-07-01 (×5): qty 1

## 2017-07-01 MED ORDER — SODIUM CHLORIDE 0.9 % IV BOLUS (SEPSIS)
1000.0000 mL | Freq: Once | INTRAVENOUS | Status: AC
  Administered 2017-07-01: 1000 mL via INTRAVENOUS

## 2017-07-01 NOTE — Consult Note (Signed)
[facility] Renal Consultation Note    Indication for Consultation:  Management of ESRD/hemodialysis, anemia, hypertension/volume, and secondary hyperparathyroidism. PCP:  HPI: [name] is a 66 y.o. male with ESRD, HTN, CAD (s/p stents), Type 2 DM, ischemic cardiomyopathy (EF 40-45%), DDD with RLE radiculopathy, hyperthyroidism, OSA, Hx SVT, Hx prostate cancer, and Hx GI bleed who was admitted with recurrent painless rectal bleeding.  [name] reports painless rectal bleeding with clots passed since last night. He has Hx GI bleeding, with prior admits 8/16-8/22/18 and 7/23-7/28/2018 for the same complaint. During his last admit, he required transfusion and then underwent flex sigmoidoscopy with radiofrequency ablation of colonic angioectasias and single rectal ulcer on 8/21. His plavix has been on hold since that time. Has been dealing with new RLE radiculopathy, but denies NSAID use. No abdominal pain, N/V. No CP. + Dyspnea only with exertion which is a chronic issue for him.  From renal standpoint, he dialyzes TTS at [facility]. Last HD was Saturday, 9/29 which he completed without issue.  Past Medical History:  Diagnosis Date  . Allergic rhinitis, cause unspecified 02/24/2014  . Anemia 06/16/2011  . BENIGN PROSTATIC HYPERTROPHY 10/14/2009  . CAD, NATIVE VESSEL 02/06/2009   a. 06/2007 s/p Taxus DES to the RCA;  b. 08/2016 NSTEMI in setting of SVT/PCI: LM 30ost, LAD 49m (3.0x16 Synergy DES), LCX 43m, OM1 60, RI 40, RCA 70p/m, 76m - not amenable to PCI.  [name] Cervical radiculopathy, chronic 02/23/2016   Right c5-6 by NCS/EMG  . CHEST PAIN 03/29/2010  . Chronic combined systolic (congestive) and diastolic (congestive) heart failure ([place])    a. 10/2016 Echo: EF 40-45%, Gr2 DD. mildly dil LA.  [name] COLONIC POLYPS, HX OF 10/14/2009  . Dementia [id number]  . DEPRESSION 10/14/2009  . Depression 09/24/2015  . DIABETES MELLITUS, TYPE II 02/01/2010  . DIZZINESS 07/17/2010  .  DYSLIPIDEMIA 06/18/2007  . ESRD (end stage renal disease) on dialysis ([place]) 08/04/2010   "TTS;  " (04/18/2015)  . FOOT PAIN 08/12/2008  . GAIT DISTURBANCE 03/03/2010  . GASTROENTERITIS, VIRAL 10/14/2009  . GERD 06/18/2007  . GOITER, MULTINODULAR 12/26/2007  . GOUT 06/18/2007  . GYNECOMASTIA 07/17/2010  . Hemodialysis access, fistula mature [facility])    Dialysis T-Th-Sa ([place]) Right upper arm fistula  . Hyperlipidemia 10/16/2011  . Hyperparathyroidism, secondary ([place]) 06/16/2011  . HYPERTENSION 06/18/2007  . Hyperthyroidism   . Hypocalcemia 06/07/2010  . Ischemic cardiomyopathy    a. 10/2016 Echo: EF 40-45%.  [name] NECK PAIN 07/31/2010  . ONYCHOMYCOSIS, TOENAILS 12/26/2007  . OSA on CPAP 10/16/2011  . Other malaise and fatigue 11/24/2009  . PERIPHERAL NEUROPATHY 06/18/2007  . Prostate cancer ([place])   . PSVT (paroxysmal supraventricular tachycardia) ([place])    a. 94/4967 complicated by NSTEMI;  b. 11/2016 Treated w/ adenosine in ED;  c. 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  [name] PULMONARY NODULE, RIGHT LOWER LOBE 06/08/2009  . Sleep apnea    cpap machine and o2  . TRANSAMINASES, SERUM, ELEVATED 02/01/2010  . Transfusion history    none recent  . Unspecified hypotension 01/30/2010   Past Surgical History:  Procedure Laterality Date  . ARTERIOVENOUS GRAFT PLACEMENT Right 2009   forearm/notes 02/01/2011  . AV FISTULA PLACEMENT  11/07/2011   Procedure: INSERTION OF ARTERIOVENOUS (AV) GORE-TEX GRAFT ARM;  Surgeon: Tinnie Gens, MD;  Location: [place];  Service: Vascular;  Laterality: Left;  . BACK SURGERY  1998  . BASCILIC VEIN TRANSPOSITION Right 02/27/2013   Procedure: BASCILIC  VEIN TRANSPOSITION;  Surgeon: Mal Misty, MD;  Location: [facility];  Service: Vascular;  Laterality: Right;  Right Basilic Vein Transposition   . CARDIAC CATHETERIZATION N/A 08/06/2016   Procedure: Left Heart Cath and Coronary Angiography;  Surgeon: Jolaine Artist, MD;  Location: [place] CV LAB;  Service: Cardiovascular;   Laterality: N/A;  . CARDIAC CATHETERIZATION N/A 08/07/2016   Procedure: Coronary/Graft Atherectomy-CSI LAD;  Surgeon: Peter M Martinique, MD;  Location: [place] CV LAB;  Service: Cardiovascular;  Laterality: N/A;  . CERVICAL SPINE SURGERY  2/09   "to repair nerve problems in my left arm"  . CHOLECYSTECTOMY    . COLONOSCOPY WITH PROPOFOL N/A 04/26/2017   Procedure: COLONOSCOPY WITH PROPOFOL;  Surgeon: Otis Brace, MD;  Location: [place];  Service: Gastroenterology;  Laterality: N/A;  . CORONARY ANGIOPLASTY WITH STENT PLACEMENT  06/11/2008  . CORONARY ANGIOPLASTY WITH STENT PLACEMENT  06/2007   TAXUS stent to RCA/notes 01/31/2011  . ESOPHAGOGASTRODUODENOSCOPY  09/28/2011   Procedure: ESOPHAGOGASTRODUODENOSCOPY (EGD);  Surgeon: Missy Sabins, MD;  Location: [facility] ENDOSCOPY;  Service: Endoscopy;  Laterality: N/A;  . ESOPHAGOGASTRODUODENOSCOPY N/A 04/07/2015   Procedure: ESOPHAGOGASTRODUODENOSCOPY (EGD);  Surgeon: Teena Irani, MD;  Location: [facility];  Service: Endoscopy;  Laterality: N/A;  . ESOPHAGOGASTRODUODENOSCOPY N/A 04/19/2015   Procedure: ESOPHAGOGASTRODUODENOSCOPY (EGD);  Surgeon: Arta Silence, MD;  Location: [facility] ENDOSCOPY;  Service: Endoscopy;  Laterality: N/A;  . FLEXIBLE SIGMOIDOSCOPY N/A 05/21/2017   Procedure: Conni Elliot;  Surgeon: Clarene Essex, MD;  Location: [place];  Service: Endoscopy;  Laterality: N/A;  . FOREIGN BODY REMOVAL  09/2003   via upper endoscopy/notes 02/12/2011  . GIVENS CAPSULE STUDY  09/30/2011   Procedure: GIVENS CAPSULE STUDY;  Surgeon: Jeryl Columbia, MD;  Location: [facility] ENDOSCOPY;  Service: Endoscopy;  Laterality: N/A;  . INSERTION OF DIALYSIS CATHETER Right 2014  . INSERTION OF DIALYSIS CATHETER Left 02/11/2013   Procedure: INSERTION OF DIALYSIS CATHETER;  Surgeon: Conrad Philip, MD;  Location: [place];  Service: Vascular;  Laterality: Left;  Ultrasound guided  . REMOVAL OF A DIALYSIS CATHETER Right 02/11/2013   Procedure: REMOVAL OF A DIALYSIS  CATHETER;  Surgeon: Conrad Vandiver, MD;  Location: [place];  Service: Vascular;  Laterality: Right;  . SAVORY DILATION N/A 04/07/2015   Procedure: SAVORY DILATION;  Surgeon: Teena Irani, MD;  Location: [facility] ENDOSCOPY;  Service: Endoscopy;  Laterality: N/A;  . SHUNTOGRAM N/A 09/20/2011   Procedure: Earney Mallet;  Surgeon: Conrad Minden, MD;  Location: [facility] CATH LAB;  Service: Cardiovascular;  Laterality: N/A;  . SVT ABLATION N/A 11/26/2016   Procedure: SVT Ablation;  Surgeon: Will Meredith Leeds, MD;  Location: [place] CV LAB;  Service: Cardiovascular;  Laterality: N/A;  . TONSILLECTOMY    . TOTAL KNEE ARTHROPLASTY Right 08/02/2015   Procedure: TOTAL KNEE ARTHROPLASTY;  Surgeon: Renette Butters, MD;  Location: [place];  Service: Orthopedics;  Laterality: Right;  . VENOGRAM N/A 01/26/2013   Procedure: VENOGRAM;  Surgeon: Angelia Mould, MD;  Location: [facility] CATH LAB;  Service: Cardiovascular;  Laterality: N/A;   Family History  Problem Relation Age of Onset  . Heart disease Sister   . Thyroid nodules Sister   . Heart disease Father   . Diabetes Father   . Kidney failure Father   . Hypertension Father   . Healthy Child   . Healthy Child   . Healthy Child   . Cancer Neg Hx    Social History:  reports that he quit smoking about  11 years ago. His smoking use included Cigarettes. He has a 25.00 pack-year smoking history. He quit smokeless tobacco use about 11 years ago. He reports that he does not drink alcohol or use drugs.  ROS: As per HPI otherwise negative.  Physical Exam: Vitals:   07/01/17 0615 07/01/17 0630 07/01/17 0700 07/01/17 0911  BP: 139/62 (!) 135/59 137/75 (!) 147/84  Pulse: 63 63 70 77  Resp: 17 12  16   Temp:      TempSrc:      SpO2: 95% 97% 93% 96%     General: Well developed, well nourished, in no acute distress. Head: Normocephalic, atraumatic, sclera non-icteric, mucus membranes are moist. Neck: Supple without lymphadenopathy/masses. JVD not elevated. Lungs: Clear  bilaterally to auscultation without wheezes, rales, or rhonchi. Breathing is unlabored. Heart: RRR with normal S1, S2. No murmurs, rubs, or gallops appreciated. Abdomen: Soft, non-tender, non-distended with normoactive bowel sounds.  Musculoskeletal:  Strength and tone appear normal for age. Lower extremities: No LE edema or ischemic changes, no open wounds. Neuro: Alert and oriented X 3. Moves all extremities spontaneously. Psych:  Responds to questions appropriately with a normal affect. Dialysis Access: AVF + thrill  Allergies  Allergen Reactions  . Cephalexin Swelling and Other (See Comments)    Tongue swelling  . Statins Other (See Comments)    Weak muscles  . Ciprofloxacin Rash   Prior to Admission medications   Medication Sig Start Date End Date Taking? Authorizing Provider  acetaminophen (TYLENOL) 325 MG tablet Take 650 mg by mouth every 6 (six) hours as needed for mild pain or headache.    Yes [provider]  allopurinol (ZYLOPRIM) 100 MG tablet Take 1 tablet (100 mg total) by mouth daily. 07/20/16  Yes Biagio Borg, MD  citalopram (CELEXA) 10 MG tablet Take 10 mg by mouth daily as needed (anxiety).    Yes [provider]  colesevelam (WELCHOL) 625 MG tablet TAKE THREE TABLETS BY MOUTH TWICE DAILY WITH MEALS Patient taking differently: Take 1,875 mg by mouth 2 (two) times daily with a meal.  07/20/16  Yes Biagio Borg, MD  ezetimibe (ZETIA) 10 MG tablet TAKE ONE TABLET BY MOUTH ONCE DAILY Patient taking differently: TAKE 10 MG BY MOUTH ONCE DAILY 04/08/17  Yes Bensimhon, Shaune Pascal, MD  gabapentin (NEURONTIN) 300 MG capsule 1-2 tabs by mouth at bedtime for sleep and pain Patient taking differently: Take 600 mg by mouth at bedtime.  07/20/16  Yes Biagio Borg, MD  HYDROcodone-acetaminophen (NORCO/VICODIN) 5-325 MG tablet Take 1 tablet by mouth every 6 (six) hours as needed for moderate pain.   Yes [provider]  hydroxypropyl methylcellulose /  hypromellose (ISOPTO TEARS / GONIOVISC) 2.5 % ophthalmic solution Place 1 drop into both eyes 3 (three) times daily as needed for dry eyes. 05/03/17  Yes Biagio Borg, MD  Lanthanum Carbonate 1000 MG PACK Take 3,000 mg by mouth 2 (two) times daily.    Yes [provider]  methimazole (TAPAZOLE) 5 MG tablet Take 1 tablet (5 mg total) by mouth daily. 10/24/16  Yes Renato Shin, MD  metoprolol tartrate (LOPRESSOR) 25 MG tablet Take 1 tablet (25 mg total) by mouth 2 (two) times daily. ON NON-HD DAYS 12/26/16  Yes Camnitz, Ocie Doyne, MD  midodrine (PROAMATINE) 10 MG tablet Take 1 tablet (10 mg total) by mouth See admin instructions. Once a day only on dialysis days (Tues/Thurs/Sat) 10/18/16  Yes Regalado, Belkys A, MD  multivitamin (RENA-VIT) TABS tablet Take  1 tablet by mouth daily. Reported on 10/24/2015   Yes [provider]  pantoprazole (PROTONIX) 40 MG tablet Take 40 mg by mouth every evening.  08/31/16  Yes [provider]  polyethylene glycol (MIRALAX / GLYCOLAX) packet Take 17 g by mouth daily. Patient taking differently: Take 17 g by mouth daily as needed for mild constipation.  05/23/17  Yes Florencia Reasons, MD  SENSIPAR 90 MG tablet Take 90 mg by mouth 2 (two) times daily. 09/18/16  Yes [provider]  oxyCODONE (OXY IR/ROXICODONE) 5 MG immediate release tablet Take 1 tablet (5 mg total) by mouth every 6 (six) hours as needed for severe pain. Must last 28 days 07/01/17   Kathrynn Ducking, MD   Current Facility-Administered Medications  Medication Dose Route Frequency Provider Last Rate Last Dose  . insulin aspart (novoLOG) injection 0-9 Units  0-9 Units Subcutaneous TID WC Jani Gravel, MD       Current Outpatient Prescriptions  Medication Sig Dispense Refill  . acetaminophen (TYLENOL) 325 MG tablet Take 650 mg by mouth every 6 (six) hours as needed for mild pain or headache.     . allopurinol (ZYLOPRIM) 100 MG tablet Take 1 tablet (100 mg total) by mouth daily. 90  tablet 3  . citalopram (CELEXA) 10 MG tablet Take 10 mg by mouth daily as needed (anxiety).     . colesevelam (WELCHOL) 625 MG tablet TAKE THREE TABLETS BY MOUTH TWICE DAILY WITH MEALS (Patient taking differently: Take 1,875 mg by mouth 2 (two) times daily with a meal. ) 180 tablet 11  . ezetimibe (ZETIA) 10 MG tablet TAKE ONE TABLET BY MOUTH ONCE DAILY (Patient taking differently: TAKE 10 MG BY MOUTH ONCE DAILY) 90 tablet 3  . gabapentin (NEURONTIN) 300 MG capsule 1-2 tabs by mouth at bedtime for sleep and pain (Patient taking differently: Take 600 mg by mouth at bedtime. ) 180 capsule 3  . HYDROcodone-acetaminophen (NORCO/VICODIN) 5-325 MG tablet Take 1 tablet by mouth every 6 (six) hours as needed for moderate pain.    . hydroxypropyl methylcellulose / hypromellose (ISOPTO TEARS / GONIOVISC) 2.5 % ophthalmic solution Place 1 drop into both eyes 3 (three) times daily as needed for dry eyes. 15 mL 11  . Lanthanum Carbonate 1000 MG PACK Take 3,000 mg by mouth 2 (two) times daily.     . methimazole (TAPAZOLE) 5 MG tablet Take 1 tablet (5 mg total) by mouth daily. 30 tablet 11  . metoprolol tartrate (LOPRESSOR) 25 MG tablet Take 1 tablet (25 mg total) by mouth 2 (two) times daily. ON NON-HD DAYS 144 tablet 3  . midodrine (PROAMATINE) 10 MG tablet Take 1 tablet (10 mg total) by mouth See admin instructions. Once a day only on dialysis days (Tues/Thurs/Sat) 30 tablet 0  . multivitamin (RENA-VIT) TABS tablet Take 1 tablet by mouth daily. Reported on 10/24/2015    . pantoprazole (PROTONIX) 40 MG tablet Take 40 mg by mouth every evening.     . polyethylene glycol (MIRALAX / GLYCOLAX) packet Take 17 g by mouth daily. (Patient taking differently: Take 17 g by mouth daily as needed for mild constipation. ) 14 each 0  . SENSIPAR 90 MG tablet Take 90 mg by mouth 2 (two) times daily.    [name] oxyCODONE (OXY IR/ROXICODONE) 5 MG immediate release tablet Take 1 tablet (5 mg total) by mouth every 6 (six) hours as needed  for severe pain. Must last 28 days 90 tablet 0   Labs: Basic Metabolic  Panel:  Recent Labs Lab 07/01/17 0314  NA 140  K 4.7  CL 101  CO2 25  GLUCOSE 164*  BUN 46*  CREATININE 10.04*  CALCIUM 10.1   Liver Function Tests:  Recent Labs Lab 07/01/17 0314  AST 14*  ALT 13*  ALKPHOS 71  BILITOT 0.8  PROT 7.0  ALBUMIN 3.4*   CBC:  Recent Labs Lab 07/01/17 0314  WBC 7.9  HGB 11.5*  HCT 38.4*  MCV 93.0  PLT 278   CBG:  Recent Labs Lab 07/01/17 0752  GLUCAP 126*   Dialysis Orders:  TTS at [facility], BFR 350, SFR 800, EDW 101.5kg, 2K/2Ca bath, AVF, Profile 1, Linear Na, 16g needles, no heparin. - Mircera 261mcg IV q 2 weeks - Hectoral 33mcg IV q HD - Home BMM meds: Sensipar 90mg , Phoslo 1/meals, Fosrenol pwdr 1/meals.  Assessment/Plan: 1.  Rectal bleeding: Per GI. Hgb stable for now. 2.  ESRD: Will continue HD per TTS schedule. No acute needs today. For HD 10/2. No heparin. 3.  BP/volume: BP controlled. On midodrine at home. No excess volume on exam. Will continue current EDW. 4.  Anemia: Hgb 11.5. Due for ESA tomorrow. Will follow AM labs to decide dosing. 5.  Metabolic bone disease: Corr Ca slightly high. Resume Fosrenol/Sensipar once eating, hold VDRA/Phoslo. 6.  Nutrition: Alb low, start supplements once eating. 7. CAD (s/p stents)/ICM: Off plavix currently. 8. Type 2 DM: On insulin, per primary. 9. Hyperthyroidism: On methimazole.  Veneta Penton, PA-C 07/01/2017, 10:15 AM  [employer] Pager: [phone]  Pt seen, examined and agree w A/P as above.  Kelly Splinter MD [employer] pager [phone]   07/01/2017, 4:59 PM

## 2017-07-01 NOTE — ED Notes (Signed)
Verbal order for 1 L NS bolus, stat H &H, and type and cross 2 units. NS bolus started, phleb consulted to draw H&H, and blood bank putting in orders for the 2 units of blood. Per provider note, GI supposed to see pt sometime today. Charge RN paging GI to this RN phone.

## 2017-07-01 NOTE — ED Notes (Signed)
Pt BP 91/77. IV positional, fluid bolus not complete. Pt repositioned for bolus to finish infusing.

## 2017-07-01 NOTE — ED Notes (Signed)
Pt BP 83/50. This RN went in to check pt CBG and recheck pt BP. Pt states to RN, "someone is going to have to change my brief again, its been bleeding." Several large clots in pt brief, and pt BP 86/53. Pt A&Ox4, resp e/u; pale on assessment. Secretary to page Dr.Rama.

## 2017-07-01 NOTE — Progress Notes (Signed)
Progress Note    [address]TA[address]  GYI:[id number] DOB: Jul 25, 1951  DOA: 07/01/2017 PCP: Biagio Borg, MD    Brief Narrative:   Chief complaint: F/U rectal bleeding  Medical records reviewed and are as summarized below:  [name] is an 66 y.o. male with a PMH of type 2 diabetes, ESRD on HD (T/T/S), hypertension, hyperlipidemia and history of prostate cancer who was admitted 07/01/17 for evaluation of acute rectal bleeding. GI consulted.  Assessment/Plan:   Principal Problem:   Rectal bleeding/Acute blood loss anemia Hemoglobin 11.5 on admission. No further blood draws yet. Suspect diverticular or radiation proctitis. Monitor closely and follow-up GI recommendations.   Active Problems:   DM (diabetes mellitus) type II controlled with renal manifestation ([name])/neuropathy Currently being managed with insulin sensitive SSI. Continue Neurontin for neuropathy.    ESRD on hemodialysis ([name])/secondary hyperparathyroidism/metabolic bone disease We'll notify nephrology of the patient's admission.   Family Communication/Anticipated D/C date and plan/Code Status   DVT prophylaxis: SCDs ordered. Code Status: Full Code.  Family Communication: Declines my offer to call family. Disposition Plan: Home when rectal bleeding stops and hemodynamically stable.  Medical Consultants:    Gastroenterology  Nephrology   Anti-Infectives:    None  Subjective:   Still reporting active rectal bleeding. Says he has not taken Plavix in the past month. Reports rectal incontinence, rectal pain. No frank dizziness or dyspnea.  Objective:    Vitals:   07/01/17 1415 07/01/17 1430 07/01/17 1445 07/01/17 1500  BP: 117/66 101/65 91/77 105/72  Pulse: 74 78 77 80  Resp:      Temp:      TempSrc:      SpO2: 93% 94% 97% 98%   No intake or output data in the 24 hours ending 07/01/17 1550 There were no vitals filed for this visit.  Exam: General: No acute distress. Chronically ill  appearing. Cardiovascular: Heart sounds show a regular rate, and rhythm. No gallops or rubs. No murmurs. No JVD. Lungs: Clear to auscultation bilaterally with good air movement. No rales, rhonchi or wheezes. Abdomen: Soft, nontender, nondistended with normal active bowel sounds. No masses. No hepatosplenomegaly. Rectal: Deferred but there was a large amount of clotted blood in diaper. Neurological: Alert and oriented 3. Moves all extremities 4 with equal strength. Cranial nerves II through XII grossly intact. Skin: Warm and dry. No rashes or lesions. Extremities: AVG right arm. No clubbing or cyanosis. No edema. Pedal pulses 1+. Psychiatric: Mood and affect are irritable. Insight and judgment are fair.   Data Reviewed:   I have personally reviewed following labs and imaging studies:  Labs: Labs show the following:   Basic Metabolic Panel:  Recent Labs Lab 07/01/17 0314  NA 140  K 4.7  CL 101  CO2 25  GLUCOSE 164*  BUN 46*  CREATININE 10.04*  CALCIUM 10.1   GFR CrCl cannot be calculated (Unknown ideal weight.). Liver Function Tests:  Recent Labs Lab 07/01/17 0314  AST 14*  ALT 13*  ALKPHOS 71  BILITOT 0.8  PROT 7.0  ALBUMIN 3.4*   CBC:  Recent Labs Lab 07/01/17 0314 07/01/17 1052 07/01/17 1347  WBC 7.9  --   --   HGB 11.5* 11.0* 10.0*  HCT 38.4* 35.2* 33.5*  MCV 93.0  --   --   PLT 278  --   --    CBG:  Recent Labs Lab 07/01/17 0752 07/01/17 1308  GLUCAP 126* 162*    Microbiology No results found  for this or any previous visit (from the past 240 hour(s)).  Procedures and diagnostic studies:  No results found.  Medications:   . insulin aspart  0-9 Units Subcutaneous TID WC  . polyethylene glycol  17 g Oral TID   Continuous Infusions:   LOS: 0 days   Daryan Cagley  [facility] Pager [phone]. If unable to reach me by pager, please call my cell phone at [phone].  *Please refer to [email or web address], password [email or web address] to get  updated schedule on who will round on this patient, as hospitalists switch teams weekly. If 7PM-7AM, please contact night-coverage at [email or web address], password [email or web address] for any overnight needs.  07/01/2017, 3:50 PM

## 2017-07-01 NOTE — ED Notes (Signed)
Spoke with patient regarding plan of care, pt concerned that he has had continued bleeding with no interventions. pt advised that we are rechecking his hemoglobin to ensure it has not decreased anymore from his last lab test, as well as monitoring his bp to ensure it is WNL. Pt made aware that GI doctor and Hospitalist were made aware of his continued rectal bleeding (as found by previous nursing notes) and are monitoring his hemoglobin results, made aware of  endoscopy planned for tmrw. Pt advised to make this RN aware if he begins to feel dizzy or lightheaded. Pt verbalized understanding of plan. VSS, resp e/u, nad.

## 2017-07-01 NOTE — ED Notes (Signed)
RN and Provider observed bright red blood on his pullup

## 2017-07-01 NOTE — ED Notes (Signed)
Endoscopy was called and they verified that this patient is on their schedule for tomorrow.

## 2017-07-01 NOTE — ED Triage Notes (Signed)
Pt reports bright red rectal bleeding that has been going on all night.  Wife reports he has been bleeding bright red blood clots.  No problems at this time, Pt has dialysis T, TH and Saturday, having gone last Saturday.

## 2017-07-01 NOTE — ED Notes (Signed)
H&H unchanged since 1400.

## 2017-07-01 NOTE — ED Notes (Signed)
[facility] called back and referred issue to [facility] doctor. Will wait for H&H results to contact Dr. Rockne Menghini.

## 2017-07-01 NOTE — Telephone Encounter (Signed)
Oxycodone rx. up front [facility]/fim

## 2017-07-01 NOTE — Progress Notes (Signed)
Patient is scheduled for a flexible sigmoidoscopy @ 1300 with Dr. Paulita Fujita. Patient will need to have dialysis competed by 1200 noon or be dialyzed after procedure.

## 2017-07-01 NOTE — ED Provider Notes (Signed)
[facility] DEPT Provider Note   CSN: [id number] Arrival date & time: 07/01/17  0250     History   Chief Complaint Chief Complaint  Patient presents with  . Rectal Bleeding    HPI [name] is a 66 y.o. male.   Rectal Bleeding  Associated symptoms: no abdominal pain, no dizziness, no fever and no vomiting     This is a 66 year old male with a history of end-stage renal disease on dialysis Tuesday, Thursday, Saturday, congestive heart failure, chronic GI bleed who presents with bright red blood per rectum. Wife reports that he has been passing large clots since yesterday afternoon. He has a history of the same. He was admitted in July and August. He has had 2 colonoscopies which showed a diffuse diverticulitis, rectal ulcer, incidental polyp. Patient also had a nuclear medicine scan during his last admission which was negative but the wife reports that his bleeding had stopped.  Patient denies any abdominal pain. Denies any active vomiting or hematemesis. Denies dizziness at this time. During his last admission, he had heavy GI bleeding resulting in transfusion and syncope.  Past Medical History:  Diagnosis Date  . Allergic rhinitis, cause unspecified 02/24/2014  . Anemia 06/16/2011  . BENIGN PROSTATIC HYPERTROPHY 10/14/2009  . CAD, NATIVE VESSEL 02/06/2009   a. 06/2007 s/p Taxus DES to the RCA;  b. 08/2016 NSTEMI in setting of SVT/PCI: LM 30ost, LAD 43m (3.0x16 Synergy DES), LCX 62m, OM1 60, RI 40, RCA 70p/m, 77m - not amenable to PCI.  [name] Cervical radiculopathy, chronic 02/23/2016   Right c5-6 by NCS/EMG  . CHEST PAIN 03/29/2010  . Chronic combined systolic (congestive) and diastolic (congestive) heart failure ([place])    a. 10/2016 Echo: EF 40-45%, Gr2 DD. mildly dil LA.  [name] COLONIC POLYPS, HX OF 10/14/2009  . Dementia [id number]  . DEPRESSION 10/14/2009  . Depression 09/24/2015  . DIABETES MELLITUS, TYPE II 02/01/2010  . DIZZINESS 07/17/2010  . DYSLIPIDEMIA 06/18/2007  . ESRD (end  stage renal disease) on dialysis ([place]) 08/04/2010   "TTS;  " (04/18/2015)  . FOOT PAIN 08/12/2008  . GAIT DISTURBANCE 03/03/2010  . GASTROENTERITIS, VIRAL 10/14/2009  . GERD 06/18/2007  . GOITER, MULTINODULAR 12/26/2007  . GOUT 06/18/2007  . GYNECOMASTIA 07/17/2010  . Hemodialysis access, fistula mature [facility])    Dialysis T-Th-Sa ([place]) Right upper arm fistula  . Hyperlipidemia 10/16/2011  . Hyperparathyroidism, secondary ([place]) 06/16/2011  . HYPERTENSION 06/18/2007  . Hyperthyroidism   . Hypocalcemia 06/07/2010  . Ischemic cardiomyopathy    a. 10/2016 Echo: EF 40-45%.  [name] NECK PAIN 07/31/2010  . ONYCHOMYCOSIS, TOENAILS 12/26/2007  . OSA on CPAP 10/16/2011  . Other malaise and fatigue 11/24/2009  . PERIPHERAL NEUROPATHY 06/18/2007  . Prostate cancer ([place])   . PSVT (paroxysmal supraventricular tachycardia) ([place])    a. 03/2693 complicated by NSTEMI;  b. 11/2016 Treated w/ adenosine in ED;  c. 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  [name] PULMONARY NODULE, RIGHT LOWER LOBE 06/08/2009  . Sleep apnea    cpap machine and o2  . TRANSAMINASES, SERUM, ELEVATED 02/01/2010  . Transfusion history    none recent  . Unspecified hypotension 01/30/2010    Patient Active Problem List   Diagnosis Date Noted  . Lightheadedness   . Rectal bleeding   . Acute pulmonary edema ([name])   . Gait disorder 05/05/2017  . Chronic GI bleeding 04/22/2017  . Acute GI bleeding 04/22/2017  . Weakness 12/27/2016  . Generalized weakness 12/27/2016  .  History of PSVT (paroxysmal supraventricular tachycardia) 10/16/2016  . Abdominal pain 08/27/2016  . Abnormal findings on diagnostic imaging of liver and biliary tract 08/27/2016  . Bloating symptom 08/27/2016  . Hematochezia 08/27/2016  . Early satiety 08/27/2016  . Eructation 08/27/2016  . Esophageal ulcer 08/27/2016  . Flatulence, eructation and gas pain 08/27/2016  . General weakness 08/27/2016  . Skin sensation disturbance 08/27/2016  . Stricture of esophagus  08/27/2016  . Alternating constipation and diarrhea 08/16/2016  . Status post coronary artery stent placement   . ST segment depression 08/05/2016  . PAD (peripheral artery disease) ([place]) 06/18/2016  . Hypotension 04/29/2016  . Cervical radiculopathy, chronic 02/23/2016  . Memory loss 12/22/2015  . Left-sided weakness 12/22/2015  . Peripheral polyneuropathy 12/22/2015  . DM (diabetes mellitus) type II controlled with renal manifestation ([place]) 10/31/2015  . Nausea and vomiting 10/31/2015  . Malignant neoplasm of prostate ([place]) 10/24/2015  . Right leg pain 09/04/2015  . Chest pain 08/09/2015  . Acute encephalopathy 08/09/2015  . S/P right total knee replacement 08/09/2015  . Primary osteoarthritis of right knee 08/05/2015  . Neuropathy due to secondary diabetes mellitus ([place]) 08/05/2015  . Arthritis of knee 08/02/2015  . Headache 06/08/2015  . Melena 04/19/2015  . Acute blood loss anemia 04/18/2015  . Hyperthyroidism 03/11/2015  . Thigh pain 12/08/2014  . Nodule of right lung 09/14/2014  . PSA elevation 08/30/2014  . ESRD on hemodialysis ([place]) 08/16/2014  . Essential hypertension 08/16/2014  . Pre-transplant evaluation for kidney transplant 08/16/2014  . Allergic rhinitis 02/24/2014  . Elevated PSA 02/24/2014  . Vertigo 10/14/2013  . Cough 06/26/2013  . Numbness 05/15/2013  . Chronic systolic heart failure ([place]) 02/09/2013  . Other complications due to renal dialysis device, implant, and graft 01/14/2013  . Right hand pain 03/28/2012  . Dysphagia 03/28/2012  . Hypogonadism, male 03/28/2012  . OSA (obstructive sleep apnea) 10/16/2011  . Hyperlipidemia 10/16/2011  . CAD S/P percutaneous coronary angioplasty 09/28/2011  . NSTEMI (non-ST elevated myocardial infarction) ([place]) 09/28/2011  . AVM (arteriovenous malformation) 09/27/2011  . Anemia associated with acute blood loss 09/26/2011  . Ischemic cardiomyopathy 06/16/2011  . Hyperparathyroidism, secondary ([place]) 06/16/2011  .  Preventative health care 03/11/2011  . NECK PAIN 07/31/2010  . GYNECOMASTIA 07/17/2010  . DIZZINESS 07/17/2010  . GAIT DISTURBANCE 03/03/2010  . Thyrotoxicosis 02/02/2010  . Elevated levels of transaminase & lactic acid dehydrogenase 02/01/2010  . Other malaise and fatigue 11/24/2009  . Depression 10/14/2009  . BENIGN PROSTATIC HYPERTROPHY 10/14/2009  . COLONIC POLYPS, HX OF 10/14/2009  . Dementia 09/02/2009  . PULMONARY NODULE, RIGHT LOWER LOBE 06/08/2009  . Chronic diastolic congestive heart failure ([name]) 02/06/2009  . DYSPNEA 10/29/2008  . FOOT PAIN 08/12/2008  . ONYCHOMYCOSIS, TOENAILS 12/26/2007  . GOITER, MULTINODULAR 12/26/2007  . Gout 06/18/2007  . Neuropathy ([place]) 06/18/2007  . Hypertensive heart disease with CHF (congestive heart failure) ([place]) 06/18/2007  . Gastro-esophageal reflux disease without esophagitis 06/18/2007    Past Surgical History:  Procedure Laterality Date  . ARTERIOVENOUS GRAFT PLACEMENT Right 2009   forearm/notes 02/01/2011  . AV FISTULA PLACEMENT  11/07/2011   Procedure: INSERTION OF ARTERIOVENOUS (AV) GORE-TEX GRAFT ARM;  Surgeon: Tinnie Gens, MD;  Location: [facility];  Service: Vascular;  Laterality: Left;  . BACK SURGERY  1998  . BASCILIC VEIN TRANSPOSITION Right 02/27/2013   Procedure: BASCILIC VEIN TRANSPOSITION;  Surgeon: Mal Misty, MD;  Location: [facility];  Service: Vascular;  Laterality: Right;  Right Basilic Vein Transposition   .  CARDIAC CATHETERIZATION N/A 08/06/2016   Procedure: Left Heart Cath and Coronary Angiography;  Surgeon: Jolaine Artist, MD;  Location: [place] CV LAB;  Service: Cardiovascular;  Laterality: N/A;  . CARDIAC CATHETERIZATION N/A 08/07/2016   Procedure: Coronary/Graft Atherectomy-CSI LAD;  Surgeon: Peter M Martinique, MD;  Location: [place] CV LAB;  Service: Cardiovascular;  Laterality: N/A;  . CERVICAL SPINE SURGERY  2/09   "to repair nerve problems in my left arm"  . CHOLECYSTECTOMY    . COLONOSCOPY WITH PROPOFOL  N/A 04/26/2017   Procedure: COLONOSCOPY WITH PROPOFOL;  Surgeon: Otis Brace, MD;  Location: [facility];  Service: Gastroenterology;  Laterality: N/A;  . CORONARY ANGIOPLASTY WITH STENT PLACEMENT  06/11/2008  . CORONARY ANGIOPLASTY WITH STENT PLACEMENT  06/2007   TAXUS stent to RCA/notes 01/31/2011  . ESOPHAGOGASTRODUODENOSCOPY  09/28/2011   Procedure: ESOPHAGOGASTRODUODENOSCOPY (EGD);  Surgeon: Missy Sabins, MD;  Location[facility] ENDOSCOPY;  Service: Endoscopy;  Laterality: N/A;  . ESOPHAGOGASTRODUODENOSCOPY N/A 04/07/2015   Procedure: ESOPHAGOGASTRODUODENOSCOPY (EGD);  Surgeon: Teena Irani, MD;  Location: [facility];  Service: Endoscopy;  Laterality: N/A;  . ESOPHAGOGASTRODUODENOSCOPY N/A 04/19/2015   Procedure: ESOPHAGOGASTRODUODENOSCOPY (EGD);  Surgeon: Arta Silence, MD;  Location: [facility] ENDOSCOPY;  Service: Endoscopy;  Laterality: N/A;  . FLEXIBLE SIGMOIDOSCOPY N/A 05/21/2017   Procedure: Conni Elliot;  Surgeon: Clarene Essex, MD;  Location: [place];  Service: Endoscopy;  Laterality: N/A;  . FOREIGN BODY REMOVAL  09/2003   via upper endoscopy/notes 02/12/2011  . GIVENS CAPSULE STUDY  09/30/2011   Procedure: GIVENS CAPSULE STUDY;  Surgeon: Jeryl Columbia, MD;  Location[facility] ENDOSCOPY;  Service: Endoscopy;  Laterality: N/A;  . INSERTION OF DIALYSIS CATHETER Right 2014  . INSERTION OF DIALYSIS CATHETER Left 02/11/2013   Procedure: INSERTION OF DIALYSIS CATHETER;  Surgeon: Conrad El Paraiso, MD;  Location: [place];  Service: Vascular;  Laterality: Left;  Ultrasound guided  . REMOVAL OF A DIALYSIS CATHETER Right 02/11/2013   Procedure: REMOVAL OF A DIALYSIS CATHETER;  Surgeon: Conrad Altamont, MD;  Location: [place];  Service: Vascular;  Laterality: Right;  . SAVORY DILATION N/A 04/07/2015   Procedure: SAVORY DILATION;  Surgeon: Teena Irani, MD;  Location: [facility] ENDOSCOPY;  Service: Endoscopy;  Laterality: N/A;  . SHUNTOGRAM N/A 09/20/2011   Procedure: Earney Mallet;  Surgeon: Conrad Radom, MD;   Location: [facility] CATH LAB;  Service: Cardiovascular;  Laterality: N/A;  . SVT ABLATION N/A 11/26/2016   Procedure: SVT Ablation;  Surgeon: Will Meredith Leeds, MD;  Location: [place] CV LAB;  Service: Cardiovascular;  Laterality: N/A;  . TONSILLECTOMY    . TOTAL KNEE ARTHROPLASTY Right 08/02/2015   Procedure: TOTAL KNEE ARTHROPLASTY;  Surgeon: Renette Butters, MD;  Location: [place];  Service: Orthopedics;  Laterality: Right;  . VENOGRAM N/A 01/26/2013   Procedure: VENOGRAM;  Surgeon: Angelia Mould, MD;  Location: [place] CATH LAB;  Service: Cardiovascular;  Laterality: N/A;       Home Medications    Prior to Admission medications   Medication Sig Start Date End Date Taking? Authorizing Provider  acetaminophen (TYLENOL) 325 MG tablet Take 650 mg by mouth every 6 (six) hours as needed for mild pain or headache.     [provider]  allopurinol (ZYLOPRIM) 100 MG tablet Take 1 tablet (100 mg total) by mouth daily. 07/20/16   Biagio Borg, MD  citalopram (CELEXA) 10 MG tablet Take 10 mg by mouth daily as needed (anxiety).     [provider]  clopidogrel (PLAVIX)  75 MG tablet Please hold plavix for a week, please see GI Dr Watt Climes in a week to discuss resuming plavix 05/22/17   Florencia Reasons, MD  colesevelam [facility]) 625 MG tablet TAKE THREE TABLETS BY MOUTH TWICE DAILY WITH MEALS Patient taking differently: Take 1,875 mg by mouth 2 (two) times daily with a meal.  07/20/16   Biagio Borg, MD  ezetimibe (ZETIA) 10 MG tablet TAKE ONE TABLET BY MOUTH ONCE DAILY Patient taking differently: TAKE 10 MG BY MOUTH ONCE DAILY 04/08/17   Bensimhon, Shaune Pascal, MD  gabapentin (NEURONTIN) 300 MG capsule 1-2 tabs by mouth at bedtime for sleep and pain Patient taking differently: Take 600 mg by mouth at bedtime.  07/20/16   Biagio Borg, MD  HYDROcodone-acetaminophen (NORCO/VICODIN) 5-325 MG tablet Take 1 tablet by mouth every 6 (six) hours as needed for moderate pain.    [provider]    hydroxypropyl methylcellulose / hypromellose (ISOPTO TEARS / GONIOVISC) 2.5 % ophthalmic solution Place 1 drop into both eyes 3 (three) times daily as needed for dry eyes. 05/03/17   Biagio Borg, MD  Lanthanum Carbonate 1000 MG PACK Take 3,000 mg by mouth 2 (two) times daily.     [provider]  methimazole (TAPAZOLE) 5 MG tablet Take 1 tablet (5 mg total) by mouth daily. 10/24/16   Renato Shin, MD  metoprolol tartrate (LOPRESSOR) 25 MG tablet Take 1 tablet (25 mg total) by mouth 2 (two) times daily. ON NON-HD DAYS 12/26/16   Camnitz, Ocie Doyne, MD  midodrine (PROAMATINE) 10 MG tablet Take 1 tablet (10 mg total) by mouth See admin instructions. Once a day only on dialysis days (Tues/Thurs/Sat) 10/18/16   Regalado, Jerald Kief A, MD  multivitamin (RENA-VIT) TABS tablet Take 1 tablet by mouth at bedtime. Reported on 10/24/2015    [provider]  pantoprazole (PROTONIX) 40 MG tablet Take 40 mg by mouth every evening.  08/31/16   [provider]  polyethylene glycol (MIRALAX / GLYCOLAX) packet Take 17 g by mouth daily. 05/23/17   Florencia Reasons, MD  SENSIPAR 90 MG tablet Take 90 mg by mouth 2 (two) times daily. 09/18/16   [provider]    Family History Family History  Problem Relation Age of Onset  . Heart disease Sister   . Thyroid nodules Sister   . Heart disease Father   . Diabetes Father   . Kidney failure Father   . Hypertension Father   . Healthy Child   . Healthy Child   . Healthy Child   . Cancer Neg Hx     Social History Social History  Substance Use Topics  . Smoking status: Former Smoker    Packs/day: 1.00    Years: 25.00    Types: Cigarettes    Quit date: 10/01/2005  . Smokeless tobacco: Former Systems developer    Quit date: 10/01/2005     Comment: Quit smoking 2007 Smoked x 25 years 1/2 ppd.  . Alcohol use No     Allergies   Cephalexin; Statins; and Ciprofloxacin   Review of Systems Review of Systems  Constitutional: Negative for fever.   Respiratory: Negative for shortness of breath.   Cardiovascular: Negative for chest pain.  Gastrointestinal: Positive for blood in stool and hematochezia. Negative for abdominal pain, nausea and vomiting.  Genitourinary: Negative for dysuria.  Neurological: Negative for dizziness.  All other systems reviewed and are negative.    Physical Exam Updated Vital Signs BP 113/70   Pulse 68  Temp 99 F (37.2 C) (Oral)   Resp 17   SpO2 97%   Physical Exam  Constitutional: He is oriented to person, place, and time.  Chronically ill-appearing, no acute distress  HENT:  Head: Normocephalic and atraumatic.  Cardiovascular: Normal rate, regular rhythm and normal heart sounds.   No murmur heard. Pulmonary/Chest: Effort normal and breath sounds normal. No respiratory distress. He has no wheezes.  Abdominal: Soft. There is no tenderness. There is no rebound.  Hyperactive bowel sounds  Genitourinary:  Genitourinary Comments: Gross blood per rectum, no active exsanguination or clot noted  Musculoskeletal: He exhibits no edema.  Fistula right upper extremity with positive thrill  Neurological: He is alert and oriented to person, place, and time.  Skin: Skin is warm and dry.  Psychiatric: He has a normal mood and affect.  Nursing note and vitals reviewed.    ED Treatments / Results  Labs (all labs ordered are listed, but only abnormal results are displayed) Labs Reviewed  COMPREHENSIVE METABOLIC PANEL - Abnormal; Notable for the following:       Result Value   Glucose, Bld 164 (*)    BUN 46 (*)    Creatinine, Ser 10.04 (*)    Albumin 3.4 (*)    AST 14 (*)    ALT 13 (*)    GFR calc non Af Amer 5 (*)    GFR calc Af Amer 5 (*)    All other components within normal limits  CBC - Abnormal; Notable for the following:    RBC 4.13 (*)    Hemoglobin 11.5 (*)    HCT 38.4 (*)    MCHC 29.9 (*)    RDW 18.7 (*)    All other components within normal limits  POC OCCULT BLOOD, ED  TYPE AND  SCREEN    EKG  EKG Interpretation None       Radiology No results found.  Procedures Procedures (including critical care time)  Medications Ordered in ED Medications - No data to display   Initial Impression / Assessment and Plan / ED Course  I have reviewed the triage vital signs and the nursing notes.  Pertinent labs & imaging results that were available during my care of the patient were reviewed by me and considered in my medical decision making (see chart for details).     Patient presents with presumed recurrent lower GI bleed. He is nontoxic on exam. Initial blood pressure recorded 92 systolic. However, multiple repeat blood pressures with systolic in the 974B. He does have some evidence of bright red blood per rectum but no active exsanguination. Limited resuscitation secondary to being on dialysis. Patient was typed and screened. Hemoglobin is improved from prior at 11. Given evidence of active bleeding, GI was consulted, Dr. Cristina Gong.  No recommendations at this time. Specifically no recommendations for nuclear medicine scan as it would likely be low yield. Recommends admission and GI evaluation first thing in the morning.    Final Clinical Impressions(s) / ED Diagnoses   Final diagnoses:  Lower GI bleed    New Prescriptions New Prescriptions   No medications on file     Merryl Hacker, MD 07/01/17 [phone]

## 2017-07-01 NOTE — ED Notes (Signed)
Per Lyanne Co, RN that gave handout report to this RN, Dr. Rockne Menghini was advised of patients decreased hemoglobin as well as hypotension, pt was given fluid bolus, bp 103/63 at this time. No current orders to transfuse blood at this time.

## 2017-07-01 NOTE — Progress Notes (Signed)
Home CPAP. RT looked and and filled with water. Will continue to monitor PT throughout the night.

## 2017-07-01 NOTE — ED Notes (Signed)
This RN spoke with Dr. Oletta Lamas from [facility], explained that there is no order to transfuse patient. Updated that patient has had another episode of clots in brief. Dr. Oletta Lamas recommended calling office and speaking with Dr. Paulita Fujita. Office called and concerns written down by secretary as Dr. Paulita Fujita was not available. H&H pending.

## 2017-07-01 NOTE — H&P (Signed)
TRH H&P   Patient Demographics:    [name], is a 66 y.o. male  MRN: [id number]   DOB - 1951/04/10  Admit Date - 07/01/2017  Outpatient Primary MD for the patient is Biagio Borg, MD  Referring MD/NP/PA: Dina Rich  Outpatient Specialists:  Arta Silence (gastroenterology) Dr. Mercy Moore (nephrology)  Patient coming from: home  Chief Complaint  Patient presents with  . Rectal Bleeding      HPI:    [name]  is a 66 y.o. male, w dm2, ESRD on HD (T, T, S), hypertension, hyperlipidemia, h/o prostate cancer, h/o rectal bleeding apparently c/o rectal bleeding was worse today and therefore presented to ED.   In ED Hgb 11.5,  ED contacted Dr. Cristina Gong who thought that nuclear medicine scan would be low yield, and stated that GI would be by in am.    Review of systems:    In addition to the HPI above,  No Fever-chills, No Headache, No changes with Vision or hearing, No problems swallowing food or Liquids, No Chest pain, Cough or Shortness of Breath, No Abdominal pain, No Nausea or Vommitting,  Slight blood from penis No dysuria, No new skin rashes or bruises, No new joints pains-aches,  No new weakness, tingling, numbness in any extremity, No recent weight gain or loss, No polyuria, polydypsia or polyphagia, No significant Mental Stressors.  A full 10 point Review of Systems was done, except as stated above, all other Review of Systems were negative.   With Past History of the following :    Past Medical History:  Diagnosis Date  . Allergic rhinitis, cause unspecified 02/24/2014  . Anemia 06/16/2011  . BENIGN PROSTATIC HYPERTROPHY 10/14/2009  . CAD, NATIVE VESSEL 02/06/2009   a. 06/2007 s/p Taxus DES to the RCA;  b. 08/2016 NSTEMI in setting of SVT/PCI: LM 30ost, LAD 24m (3.0x16 Synergy DES), LCX 79m, OM1 60, RI 40, RCA 70p/m, 6m - not amenable to PCI.  [name]  Cervical radiculopathy, chronic 02/23/2016   Right c5-6 by NCS/EMG  . CHEST PAIN 03/29/2010  . Chronic combined systolic (congestive) and diastolic (congestive) heart failure ([place])    a. 10/2016 Echo: EF 40-45%, Gr2 DD. mildly dil LA.  [name] COLONIC POLYPS, HX OF 10/14/2009  . Dementia [id number]  . DEPRESSION 10/14/2009  . Depression 09/24/2015  . DIABETES MELLITUS, TYPE II 02/01/2010  . DIZZINESS 07/17/2010  . DYSLIPIDEMIA 06/18/2007  . ESRD (end stage renal disease) on dialysis ([place]) 08/04/2010   "TTS;  " (04/18/2015)  . FOOT PAIN 08/12/2008  . GAIT DISTURBANCE 03/03/2010  . GASTROENTERITIS, VIRAL 10/14/2009  . GERD 06/18/2007  . GOITER, MULTINODULAR 12/26/2007  . GOUT 06/18/2007  . GYNECOMASTIA 07/17/2010  . Hemodialysis access, fistula mature [facility])    Dialysis T-Th-Sa ([place]) Right upper arm fistula  . Hyperlipidemia 10/16/2011  . Hyperparathyroidism, secondary ([place]) 06/16/2011  . HYPERTENSION 06/18/2007  .  Hyperthyroidism   . Hypocalcemia 06/07/2010  . Ischemic cardiomyopathy    a. 10/2016 Echo: EF 40-45%.  [name] NECK PAIN 07/31/2010  . ONYCHOMYCOSIS, TOENAILS 12/26/2007  . OSA on CPAP 10/16/2011  . Other malaise and fatigue 11/24/2009  . PERIPHERAL NEUROPATHY 06/18/2007  . Prostate cancer ([place])   . PSVT (paroxysmal supraventricular tachycardia) ([place])    a. 72/5366 complicated by NSTEMI;  b. 11/2016 Treated w/ adenosine in ED;  c. 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  [name] PULMONARY NODULE, RIGHT LOWER LOBE 06/08/2009  . Sleep apnea    cpap machine and o2  . TRANSAMINASES, SERUM, ELEVATED 02/01/2010  . Transfusion history    none recent  . Unspecified hypotension 01/30/2010      Past Surgical History:  Procedure Laterality Date  . ARTERIOVENOUS GRAFT PLACEMENT Right 2009   forearm/notes 02/01/2011  . AV FISTULA PLACEMENT  11/07/2011   Procedure: INSERTION OF ARTERIOVENOUS (AV) GORE-TEX GRAFT ARM;  Surgeon: Tinnie Gens, MD;  Location: [place];  Service: Vascular;  Laterality: Left;  . BACK  SURGERY  1998  . BASCILIC VEIN TRANSPOSITION Right 02/27/2013   Procedure: BASCILIC VEIN TRANSPOSITION;  Surgeon: Mal Misty, MD;  Location: [place];  Service: Vascular;  Laterality: Right;  Right Basilic Vein Transposition   . CARDIAC CATHETERIZATION N/A 08/06/2016   Procedure: Left Heart Cath and Coronary Angiography;  Surgeon: Jolaine Artist, MD;  Location: [place] CV LAB;  Service: Cardiovascular;  Laterality: N/A;  . CARDIAC CATHETERIZATION N/A 08/07/2016   Procedure: Coronary/Graft Atherectomy-CSI LAD;  Surgeon: Peter M Martinique, MD;  Location: [place] CV LAB;  Service: Cardiovascular;  Laterality: N/A;  . CERVICAL SPINE SURGERY  2/09   "to repair nerve problems in my left arm"  . CHOLECYSTECTOMY    . COLONOSCOPY WITH PROPOFOL N/A 04/26/2017   Procedure: COLONOSCOPY WITH PROPOFOL;  Surgeon: Otis Brace, MD;  Location: [place];  Service: Gastroenterology;  Laterality: N/A;  . CORONARY ANGIOPLASTY WITH STENT PLACEMENT  06/11/2008  . CORONARY ANGIOPLASTY WITH STENT PLACEMENT  06/2007   TAXUS stent to RCA/notes 01/31/2011  . ESOPHAGOGASTRODUODENOSCOPY  09/28/2011   Procedure: ESOPHAGOGASTRODUODENOSCOPY (EGD);  Surgeon: Missy Sabins, MD;  Location: [facility] ENDOSCOPY;  Service: Endoscopy;  Laterality: N/A;  . ESOPHAGOGASTRODUODENOSCOPY N/A 04/07/2015   Procedure: ESOPHAGOGASTRODUODENOSCOPY (EGD);  Surgeon: Teena Irani, MD;  Location: [facility];  Service: Endoscopy;  Laterality: N/A;  . ESOPHAGOGASTRODUODENOSCOPY N/A 04/19/2015   Procedure: ESOPHAGOGASTRODUODENOSCOPY (EGD);  Surgeon: Arta Silence, MD;  Location: [facility] ENDOSCOPY;  Service: Endoscopy;  Laterality: N/A;  . FLEXIBLE SIGMOIDOSCOPY N/A 05/21/2017   Procedure: Conni Elliot;  Surgeon: Clarene Essex, MD;  Location: [place];  Service: Endoscopy;  Laterality: N/A;  . FOREIGN BODY REMOVAL  09/2003   via upper endoscopy/notes 02/12/2011  . GIVENS CAPSULE STUDY  09/30/2011   Procedure: GIVENS CAPSULE STUDY;   Surgeon: Jeryl Columbia, MD;  Location: [facility] ENDOSCOPY;  Service: Endoscopy;  Laterality: N/A;  . INSERTION OF DIALYSIS CATHETER Right 2014  . INSERTION OF DIALYSIS CATHETER Left 02/11/2013   Procedure: INSERTION OF DIALYSIS CATHETER;  Surgeon: Conrad Ridge[place] Corners, MD;  Location: [place];  Service: Vascular;  Laterality: Left;  Ultrasound guided  . REMOVAL OF A DIALYSIS CATHETER Right 02/11/2013   Procedure: REMOVAL OF A DIALYSIS CATHETER;  Surgeon: Conrad [place], MD;  Location: [place];  Service: Vascular;  Laterality: Right;  . SAVORY DILATION N/A 04/07/2015   Procedure: SAVORY DILATION;  Surgeon: Teena Irani, MD;  Location: [facility] ENDOSCOPY;  Service: Endoscopy;  Laterality: N/A;  . SHUNTOGRAM N/A 09/20/2011   Procedure: Earney Mallet;  Surgeon: Conrad Compton, MD;  Location: [facility] CATH LAB;  Service: Cardiovascular;  Laterality: N/A;  . SVT ABLATION N/A 11/26/2016   Procedure: SVT Ablation;  Surgeon: Will Meredith Leeds, MD;  Location: [place] CV LAB;  Service: Cardiovascular;  Laterality: N/A;  . TONSILLECTOMY    . TOTAL KNEE ARTHROPLASTY Right 08/02/2015   Procedure: TOTAL KNEE ARTHROPLASTY;  Surgeon: Renette Butters, MD;  Location: [place];  Service: Orthopedics;  Laterality: Right;  . VENOGRAM N/A 01/26/2013   Procedure: VENOGRAM;  Surgeon: Angelia Mould, MD;  Location: [facility] CATH LAB;  Service: Cardiovascular;  Laterality: N/A;      Social History:     Social History  Substance Use Topics  . Smoking status: Former Smoker    Packs/day: 1.00    Years: 25.00    Types: Cigarettes    Quit date: 10/01/2005  . Smokeless tobacco: Former Systems developer    Quit date: 10/01/2005     Comment: Quit smoking 2007 Smoked x 25 years 1/2 ppd.  . Alcohol use No     Lives - at home  Mobility - walks by self  Family History :     Family History  Problem Relation Age of Onset  . Heart disease Sister   . Thyroid nodules Sister   . Heart disease Father   . Diabetes Father   . Kidney failure Father   . Hypertension Father     . Healthy Child   . Healthy Child   . Healthy Child   . Cancer Neg Hx       Home Medications:   Prior to Admission medications   Medication Sig Start Date End Date Taking? Authorizing Provider  acetaminophen (TYLENOL) 325 MG tablet Take 650 mg by mouth every 6 (six) hours as needed for mild pain or headache.    Yes [provider]  allopurinol (ZYLOPRIM) 100 MG tablet Take 1 tablet (100 mg total) by mouth daily. 07/20/16  Yes Biagio Borg, MD  citalopram (CELEXA) 10 MG tablet Take 10 mg by mouth daily as needed (anxiety).    Yes [provider]  colesevelam (WELCHOL) 625 MG tablet TAKE THREE TABLETS BY MOUTH TWICE DAILY WITH MEALS Patient taking differently: Take 1,875 mg by mouth 2 (two) times daily with a meal.  07/20/16  Yes Biagio Borg, MD  ezetimibe (ZETIA) 10 MG tablet TAKE ONE TABLET BY MOUTH ONCE DAILY Patient taking differently: TAKE 10 MG BY MOUTH ONCE DAILY 04/08/17  Yes Bensimhon, Shaune Pascal, MD  gabapentin (NEURONTIN) 300 MG capsule 1-2 tabs by mouth at bedtime for sleep and pain Patient taking differently: Take 600 mg by mouth at bedtime.  07/20/16  Yes Biagio Borg, MD  HYDROcodone-acetaminophen (NORCO/VICODIN) 5-325 MG tablet Take 1 tablet by mouth every 6 (six) hours as needed for moderate pain.   Yes [provider]  hydroxypropyl methylcellulose / hypromellose (ISOPTO TEARS / GONIOVISC) 2.5 % ophthalmic solution Place 1 drop into both eyes 3 (three) times daily as needed for dry eyes. 05/03/17  Yes Biagio Borg, MD  Lanthanum Carbonate 1000 MG PACK Take 3,000 mg by mouth 2 (two) times daily.    Yes [provider]  methimazole (TAPAZOLE) 5 MG tablet Take 1 tablet (5 mg total) by mouth daily. 10/24/16  Yes Renato Shin, MD  metoprolol tartrate (LOPRESSOR) 25 MG tablet Take 1 tablet (25 mg total) by mouth 2 (two) times  daily. ON NON-HD DAYS 12/26/16  Yes Camnitz, Ocie Doyne, MD  midodrine (PROAMATINE) 10 MG tablet Take 1 tablet (10 mg  total) by mouth See admin instructions. Once a day only on dialysis days (Tues/Thurs/Sat) 10/18/16  Yes Regalado, Belkys A, MD  multivitamin (RENA-VIT) TABS tablet Take 1 tablet by mouth daily. Reported on 10/24/2015   Yes [provider]  pantoprazole (PROTONIX) 40 MG tablet Take 40 mg by mouth every evening.  08/31/16  Yes [provider]  polyethylene glycol (MIRALAX / GLYCOLAX) packet Take 17 g by mouth daily. Patient taking differently: Take 17 g by mouth daily as needed for mild constipation.  05/23/17  Yes Florencia Reasons, MD  SENSIPAR 90 MG tablet Take 90 mg by mouth 2 (two) times daily. 09/18/16  Yes [provider]     Allergies:     Allergies  Allergen Reactions  . Cephalexin Swelling and Other (See Comments)    Tongue swelling  . Statins Other (See Comments)    Weak muscles  . Ciprofloxacin Rash     Physical Exam:   Vitals  Blood pressure 113/70, pulse 68, temperature 99 F (37.2 C), temperature source Oral, resp. rate 17, SpO2 97 %.   1. General  lying in bed in NAD,  2. Normal affect and insight, Not Suicidal or Homicidal, Awake Alert, Oriented X 3.  3. No F.N deficits, ALL C.Nerves Intact, Strength 5/5 all 4 extremities, Sensation intact all 4 extremities, Plantars down going.  4. Ears and Eyes appear Normal, Conjunctivae clear, PERRLA. Moist Oral Mucosa.  5. Supple Neck, No JVD, No cervical lymphadenopathy appriciated, No Carotid Bruits.  6. Symmetrical Chest wall movement, Good air movement bilaterally, CTAB.  7. RRR, No Gallops, Rubs or Murmurs, No Parasternal Heave.  8. Positive Bowel Sounds, Abdomen Soft, No tenderness, No organomegaly appriciated,No rebound -guarding or rigidity.  9.  No Cyanosis, Normal Skin Turgor, No Skin Rash or Bruise.  10. Good muscle tone,  joints appear normal , no effusions, Normal ROM.  11. No Palpable Lymph Nodes in Neck or Axillae     Data Review:    CBC  Recent Labs Lab 07/01/17 0314  WBC 7.9   HGB 11.5*  HCT 38.4*  PLT 278  MCV 93.0  MCH 27.8  MCHC 29.9*  RDW 18.7*   ------------------------------------------------------------------------------------------------------------------  Chemistries   Recent Labs Lab 07/01/17 0314  NA 140  K 4.7  CL 101  CO2 25  GLUCOSE 164*  BUN 46*  CREATININE 10.04*  CALCIUM 10.1  AST 14*  ALT 13*  ALKPHOS 71  BILITOT 0.8   ------------------------------------------------------------------------------------------------------------------ CrCl cannot be calculated (Unknown ideal weight.). ------------------------------------------------------------------------------------------------------------------ No results for input(s): TSH, T4TOTAL, T3FREE, THYROIDAB in the last 72 hours.  Invalid input(s): FREET3  Coagulation profile No results for input(s): INR, PROTIME in the last 168 hours. ------------------------------------------------------------------------------------------------------------------- No results for input(s): DDIMER in the last 72 hours. -------------------------------------------------------------------------------------------------------------------  Cardiac Enzymes No results for input(s): CKMB, TROPONINI, MYOGLOBIN in the last 168 hours.  Invalid input(s): CK ------------------------------------------------------------------------------------------------------------------    Component Value Date/Time   BNP 54.9 08/09/2015 0045     ---------------------------------------------------------------------------------------------------------------  Urinalysis    Component Value Date/Time   COLORURINE LT. YELLOW 06/26/2013 1200   APPEARANCEUR CLEAR 06/26/2013 1200   LABSPEC 1.020 06/26/2013 1200   PHURINE 8.5 06/26/2013 1200   GLUCOSEU 100 06/26/2013 1200   HGBUR MODERATE 06/26/2013 1200   BILIRUBINUR NEGATIVE 06/26/2013 1200   KETONESUR NEGATIVE 06/26/2013 1200   PROTEINUR >300 (A) 01/04/2010 1032  UROBILINOGEN 0.2 06/26/2013 1200   NITRITE NEGATIVE 06/26/2013 1200   LEUKOCYTESUR TRACE 06/26/2013 1200    ----------------------------------------------------------------------------------------------------------------   Imaging Results:    No results found.    Assessment & Plan:    Principal Problem:   Rectal bleeding Active Problems:   Anemia associated with acute blood loss   DM (diabetes mellitus) type II controlled with renal manifestation ([name])   ESRD on hemodialysis ([name])    Rectal bleeding  NPO Ns iv Type and screen GI consulted by ED, appreciate input  Blood from penis Check urinalysis  ESRD on HD, M, W, F  Please consult nephrology in am  Anemia Repeat cbc in am  Dm2 fsbs ac and qhs, ISS  DVT Prophylaxis Heparin -   - SCDs  AM Labs Ordered, also please review Full Orders  Family Communication: Admission, patients condition and plan of care including tests being ordered have been discussed with the patient  who indicate understanding and agree with the plan and Code Status.  Code Status FULL CODE  Likely DC to  home  Condition GUARDED    Consults called: GI by ED, please consult nephrology in am  Admission status: inpatient  Time spent in minutes : 45   Jani Gravel M.D on 07/01/2017 at 4:58 AM  Between 7am to 7pm - Pager - [phone]    After 7pm go to [email or web address] - password [facility]  [facility] - Office  [phone]

## 2017-07-01 NOTE — ED Notes (Signed)
Pt and wife advised to let staff know when he needs to be cleaned up again. Pt and wife verbalized understanding.

## 2017-07-01 NOTE — ED Notes (Signed)
Pt CBG was 126, notified Kelly(RN)

## 2017-07-01 NOTE — ED Notes (Signed)
Phleb at bedside drawing stat H & H.

## 2017-07-01 NOTE — Progress Notes (Signed)
Subjective: Returns to ED with bleeding.  Objective: Vital signs in last 24 hours: Temp:  [99 F (37.2 C)] 99 F (37.2 C) (10/01 0322) Pulse Rate:  [63-83] 83 (10/01 1315) Resp:  [11-22] 16 (10/01 0911) BP: (83-153)/(49-84) 92/61 (10/01 1315) SpO2:  [93 %-99 %] 98 % (10/01 1315) Weight change:     PE:  GEN:  Chronically ill-appearing ABD:  Soft, protuberant, non-tender  Lab Results: CBC    Component Value Date/Time   WBC 7.9 07/01/2017 0314   RBC 4.13 (L) 07/01/2017 0314   HGB 11.0 (L) 07/01/2017 1052   HGB 11.7 (L) 11/20/2016 0842   HCT 35.2 (L) 07/01/2017 1052   HCT 35.6 (L) 11/20/2016 0842   PLT 278 07/01/2017 0314   PLT 304 11/20/2016 0842   MCV 93.0 07/01/2017 0314   MCV 93 11/20/2016 0842   MCH 27.8 07/01/2017 0314   MCHC 29.9 (L) 07/01/2017 0314   RDW 18.7 (H) 07/01/2017 0314   RDW 18.2 (H) 11/20/2016 0842   LYMPHSABS 1.7 05/03/2017 1612   MONOABS 1.1 (H) 05/03/2017 1612   EOSABS 0.2 05/03/2017 1612   BASOSABS 0.1 05/03/2017 1612   CMP     Component Value Date/Time   NA 140 07/01/2017 0314   NA 143 11/20/2016 0842   K 4.7 07/01/2017 0314   CL 101 07/01/2017 0314   CO2 25 07/01/2017 0314   GLUCOSE 164 (H) 07/01/2017 0314   BUN 46 (H) 07/01/2017 0314   BUN 59 (H) 11/20/2016 0842   CREATININE 10.04 (H) 07/01/2017 0314   CALCIUM 10.1 07/01/2017 0314   PROT 7.0 07/01/2017 0314   ALBUMIN 3.4 (L) 07/01/2017 0314   AST 14 (L) 07/01/2017 0314   ALT 13 (L) 07/01/2017 0314   ALKPHOS 71 07/01/2017 0314   BILITOT 0.8 07/01/2017 0314   GFRNONAA 5 (L) 07/01/2017 0314   GFRAA 5 (L) 07/01/2017 0314    Assessment:  1.  Recurrent hematochezia with clots.  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 2.  Anemia, acute on chronic.  Plan:  1.  Flexible sigmoidoscopy tomorrow for further evaluation. 2.  Prep with scheduled doses of Miralax; no enema due to suspected large distal rectal ulcer. 3.  If rampant  overt bleeding in the interval, consider tagged RBC study as next step in management. 4.  Next step in management pending flexible sigmoidoscopy findings. 5.  [facility] will follow.   [name] 07/01/2017, 1:41 PM   Cell [phone] If no answer or after 5 PM call [phone]

## 2017-07-01 NOTE — ED Notes (Signed)
Dialysis MD at bedside.

## 2017-07-02 ENCOUNTER — Inpatient Hospital Stay (HOSPITAL_COMMUNITY): Payer: Medicare Other | Admitting: Certified Registered Nurse Anesthetist

## 2017-07-02 ENCOUNTER — Encounter (HOSPITAL_COMMUNITY)
Admission: EM | Disposition: A | Discharge status: Home / Self Care | Payer: Self-pay | Source: Home / Self Care | Attending: Internal Medicine

## 2017-07-02 ENCOUNTER — Encounter (HOSPITAL_COMMUNITY): Payer: Self-pay

## 2017-07-02 HISTORY — PX: FLEXIBLE SIGMOIDOSCOPY: SHX5431

## 2017-07-02 LAB — HEMOGLOBIN AND HEMATOCRIT, BLOOD
HCT: 30.7 % — ABNORMAL LOW (ref 39.0–52.0)
HCT: 30.3 % — ABNORMAL LOW (ref 39.0–52.0)
HEMOGLOBIN: 9.8 g/dL — AB (ref 13.0–17.0)
Hemoglobin: 9.3 g/dL — ABNORMAL LOW (ref 13.0–17.0)

## 2017-07-02 LAB — GLUCOSE, CAPILLARY
GLUCOSE-CAPILLARY: 115 mg/dL — AB (ref 65–99)
Glucose-Capillary: 117 mg/dL — ABNORMAL HIGH (ref 65–99)
Glucose-Capillary: 121 mg/dL — ABNORMAL HIGH (ref 65–99)
Glucose-Capillary: 132 mg/dL — ABNORMAL HIGH (ref 65–99)
Glucose-Capillary: 143 mg/dL — ABNORMAL HIGH (ref 65–99)
Glucose-Capillary: 94 mg/dL (ref 65–99)

## 2017-07-02 LAB — CBC
HCT: 27.2 % — ABNORMAL LOW (ref 39.0–52.0)
Hemoglobin: 8.3 g/dL — ABNORMAL LOW (ref 13.0–17.0)
MCH: 27.8 pg (ref 26.0–34.0)
MCHC: 30.5 g/dL (ref 30.0–36.0)
MCV: 91 fL (ref 78.0–100.0)
PLATELETS: 260 10*3/uL (ref 150–400)
RBC: 2.99 MIL/uL — ABNORMAL LOW (ref 4.22–5.81)
RDW: 18.4 % — AB (ref 11.5–15.5)
WBC: 7.9 10*3/uL (ref 4.0–10.5)

## 2017-07-02 LAB — COMPREHENSIVE METABOLIC PANEL
ALT: 12 U/L — AB (ref 17–63)
AST: 14 U/L — AB (ref 15–41)
Albumin: 2.9 g/dL — ABNORMAL LOW (ref 3.5–5.0)
Alkaline Phosphatase: 61 U/L (ref 38–126)
Anion gap: 15 (ref 5–15)
BUN: 61 mg/dL — AB (ref 6–20)
CHLORIDE: 101 mmol/L (ref 101–111)
CO2: 22 mmol/L (ref 22–32)
CREATININE: 12.06 mg/dL — AB (ref 0.61–1.24)
Calcium: 8.9 mg/dL (ref 8.9–10.3)
GFR calc Af Amer: 4 mL/min — ABNORMAL LOW (ref >60)
GFR, EST NON AFRICAN AMERICAN: 4 mL/min — AB (ref >60)
GLUCOSE: 125 mg/dL — AB (ref 65–99)
Potassium: 5 mmol/L (ref 3.5–5.1)
Sodium: 138 mmol/L (ref 135–145)
Total Bilirubin: 0.8 mg/dL (ref 0.3–1.2)
Total Protein: 5.9 g/dL — ABNORMAL LOW (ref 6.5–8.1)

## 2017-07-02 LAB — HEMOGLOBIN A1C
Hgb A1c MFr Bld: 4.8 % (ref 4.8–5.6)
MEAN PLASMA GLUCOSE: 91.06 mg/dL

## 2017-07-02 LAB — HEPATITIS B SURFACE ANTIGEN: HEP B S AG: NEGATIVE

## 2017-07-02 LAB — PREPARE RBC (CROSSMATCH)

## 2017-07-02 SURGERY — SIGMOIDOSCOPY, FLEXIBLE
Anesthesia: Monitor Anesthesia Care | Laterality: Left

## 2017-07-02 MED ORDER — SODIUM CHLORIDE 0.9 % IV SOLN: 100.0000 mL | INTRAVENOUS | Status: DC | PRN

## 2017-07-02 MED ORDER — PHENYLEPHRINE 40 MCG/ML (10ML) SYRINGE FOR IV PUSH (FOR BLOOD PRESSURE SUPPORT)
PREFILLED_SYRINGE | INTRAVENOUS | Status: DC | PRN
  Administered 2017-07-02: 60 ug via INTRAVENOUS

## 2017-07-02 MED ORDER — PROPOFOL 10 MG/ML IV BOLUS
INTRAVENOUS | Status: DC | PRN
  Administered 2017-07-02 (×2): 20 mg via INTRAVENOUS

## 2017-07-02 MED ORDER — LIDOCAINE HCL (PF) 1 % IJ SOLN: 5.0000 mL | INTRAMUSCULAR | Status: DC | PRN

## 2017-07-02 MED ORDER — DARBEPOETIN ALFA 200 MCG/0.4ML IJ SOSY: 200.0000 ug | PREFILLED_SYRINGE | INTRAMUSCULAR | Status: DC

## 2017-07-02 MED ORDER — SODIUM CHLORIDE 0.9 % IV SOLN
INTRAVENOUS | Status: DC
  Administered 2017-07-02: 500 mL via INTRAVENOUS

## 2017-07-02 MED ORDER — PENTAFLUOROPROP-TETRAFLUOROETH EX AERO: 1.0000 "application " | INHALATION_SPRAY | CUTANEOUS | Status: DC | PRN

## 2017-07-02 MED ORDER — PROPOFOL 500 MG/50ML IV EMUL
INTRAVENOUS | Status: DC | PRN
  Administered 2017-07-02: 100 ug/kg/min via INTRAVENOUS

## 2017-07-02 MED ORDER — SODIUM CHLORIDE 0.9 % IV SOLN: Freq: Once | INTRAVENOUS | Status: DC

## 2017-07-02 MED ORDER — LIDOCAINE 2% (20 MG/ML) 5 ML SYRINGE
INTRAMUSCULAR | Status: DC | PRN
  Administered 2017-07-02: 100 mg via INTRAVENOUS

## 2017-07-02 MED ORDER — LIDOCAINE-PRILOCAINE 2.5-2.5 % EX CREA: 1.0000 "application " | TOPICAL_CREAM | CUTANEOUS | Status: DC | PRN

## 2017-07-02 MED ORDER — MIDODRINE HCL 5 MG PO TABS
ORAL_TABLET | ORAL | Status: AC
  Filled 2017-07-02: qty 2

## 2017-07-02 NOTE — Progress Notes (Signed)
At 1210 pt back in room post HD this am, report received from [facility] in dialysis. Pt needing to go to bathroom, able to walk slowly with assist and walker to bathroom, pt refused BSC. Pt had BM, able to see some blood on tissue NT stated. Pt had been passing blood clots during night shift last night per report.  Pt c/o being tired and weak.  Pt getting agitated towards wife when she ask him questions. Encouraged pt to relax.    At 1226 Endo here at this time to take pt for flex sigmoid today. Pt went via bed, wife at pt's side with Endo dept sfaff.

## 2017-07-02 NOTE — Progress Notes (Signed)
Progress Note    [name]  BPZ:[id number] DOB: [birth year]  DOA: 07/01/2017 PCP: Biagio Borg, MD    Brief Narrative:   Chief complaint: F/U rectal bleeding  Medical records reviewed and are as summarized below:  [name] is an 66 y.o. male with a PMH of type 2 diabetes, ESRD on HD (T/T/S), hypertension, hyperlipidemia and history of prostate cancer who was admitted 07/01/17 for evaluation of acute rectal bleeding. GI consulted.  Assessment/Plan:   Principal Problem:   Rectal bleeding/Acute blood loss anemia/Hypotension Has had an approximate 3 g drop in hemoglobin since admission. Suspect radiation proctitis or large distal rectal ulcer. Sigmoidoscopy per GI planned for today.   Active Problems:   DM (diabetes mellitus) type II controlled with renal manifestation ([name])/neuropathy Currently being managed with insulin sensitive SSI. CBGs controlled at 117-162. Continue Neurontin for neuropathy.    ESRD on hemodialysis ([name])/secondary hyperparathyroidism/metabolic bone disease We'll notify nephrology of the patient's admission.   Family Communication/Anticipated D/C date and plan/Code Status   DVT prophylaxis: SCDs ordered. Code Status: Full Code.  Family Communication: Declines my offer to call family. Disposition Plan: Home when rectal bleeding stops and hemodynamically stable.  Medical Consultants:    Gastroenterology  Nephrology   Anti-Infectives:    None  Subjective:   Still reporting active rectal bleeding over night. Denies chest pain, dizziness.   Objective:    Vitals:   07/02/17 0726 07/02/17 0800 07/02/17 0815 07/02/17 0820  BP: (!) 112/54 (!) 81/47 (!) 59/44 (!) 74/37  Pulse: 69 73 74 (!) 59  Resp: 11 12 12 11   Temp:      TempSrc:      SpO2:   100% 100%  Weight:      Height:       No intake or output data in the 24 hours ending 07/02/17 0850 Filed Weights   07/01/17 2051 07/02/17 0725  Weight: 102.6 kg (226 lb 4.8 oz) 100.1  kg (220 lb 10.9 oz)    Exam: General: No acute distress. Receiving HD. Sleepy. Irritable about being awoken. Cardiovascular: Heart sounds show a regular rate, and rhythm. No gallops or rubs. No murmurs. No JVD. Lungs: Clear to auscultation bilaterally with good air movement. No rales, rhonchi or wheezes. Abdomen: Soft, nontender, nondistended with normal active bowel sounds. No masses. No hepatosplenomegaly. Skin: Warm and dry. No rashes or lesions. Extremities: No clubbing or cyanosis. No edema. Pedal pulses 2+. AVG accessed for HD.   Data Reviewed:   I have personally reviewed following labs and imaging studies:  Labs: Labs show the following:   Basic Metabolic Panel:  Recent Labs Lab 07/01/17 0314 07/02/17 0730  NA 140 138  K 4.7 5.0  CL 101 101  CO2 25 22  GLUCOSE 164* 125*  BUN 46* 61*  CREATININE 10.04* 12.06*  CALCIUM 10.1 8.9   GFR Estimated Creatinine Clearance: 7.3 mL/min (A) (by C-G formula based on SCr of 12.06 mg/dL (H)). Liver Function Tests:  Recent Labs Lab 07/01/17 0314 07/02/17 0730  AST 14* 14*  ALT 13* 12*  ALKPHOS 71 61  BILITOT 0.8 0.8  PROT 7.0 5.9*  ALBUMIN 3.4* 2.9*   CBC:  Recent Labs Lab 07/01/17 0314  07/01/17 1347 07/01/17 1640 07/01/17 1941 07/01/17 2141 07/02/17 0730  WBC 7.9  --   --   --  8.9  --  7.9  HGB 11.5*  < > 10.0* 10.1* 9.1* 9.1* 8.3*  HCT 38.4*  < >  33.5* 32.8* 31.0* 28.4* 27.2*  MCV 93.0  --   --   --  92.0  --  91.0  PLT 278  --   --   --  258  --  260  < > = values in this interval not displayed. CBG:  Recent Labs Lab 07/01/17 1308 07/01/17 1605 07/01/17 2048 07/02/17 0014 07/02/17 0412  GLUCAP 162* 138* 121* 121* 117*    Microbiology No results found for this or any previous visit (from the past 240 hour(s)).  Procedures and diagnostic studies:  No results found.  Medications:   . allopurinol  100 mg Oral Daily  . cinacalcet  90 mg Oral BID WC  . colesevelam  1,875 mg Oral BID WC  .  ezetimibe  10 mg Oral Daily  . gabapentin  300 mg Oral QHS  . insulin aspart  0-9 Units Subcutaneous TID WC  . lanthanum  1,000 mg Oral TID WC  . methimazole  5 mg Oral Daily  . metoprolol tartrate  25 mg Oral 2 times per day on Sun Mon Wed Fri  . midodrine  10 mg Oral Q T,Th,Sa-HD  . multivitamin  1 tablet Oral QHS  . pantoprazole  40 mg Oral QPM  . polyethylene glycol  17 g Oral TID  . sodium chloride flush  3 mL Intravenous Q12H   Continuous Infusions: . sodium chloride    . sodium chloride    . sodium chloride       LOS: 1 day   Shamaya Kauer  [facility] Pager [phone]. If unable to reach me by pager, please call my cell phone at [phone].  *Please refer to [email or web address], password [email or web address] to get updated schedule on who will round on this patient, as hospitalists switch teams weekly. If 7PM-7AM, please contact night-coverage at [email or web address], password [email or web address] for any overnight needs.  07/02/2017, 8:50 AM

## 2017-07-02 NOTE — Progress Notes (Signed)
PT placed on his Home CPAP.  PT resting well. RT will continue to monitor.

## 2017-07-02 NOTE — Progress Notes (Signed)
[facility] Progress Note   Subjective:  Seen on HD. UF off due to hypotension. Continues to have rectal bleeding, for flex sig this afternoon. Hgb down to 8.3 this morning, will plan to give 1U PRBCs with HD today.  Objective Vitals:   07/02/17 0930 07/02/17 1000 07/02/17 1015 07/02/17 1020  BP: (!) 86/46 (!) 79/30 (!) 64/31 (!) 75/33  Pulse: 81 95 90 86  Resp: 13 (!) 26 (!) 23 17  Temp:      TempSrc:      SpO2: 100% 97% 100% 100%  Weight:      Height:       Physical Exam General: Well appearing male, NAD. Heart: RRR, no murmur Lungs: CTAB Extremities: No LE edema Dialysis Access: AVF + thrill (cannulated)  Additional Objective Labs: Basic Metabolic Panel:  Recent Labs Lab 07/01/17 0314 07/02/17 0730  NA 140 138  K 4.7 5.0  CL 101 101  CO2 25 22  GLUCOSE 164* 125*  BUN 46* 61*  CREATININE 10.04* 12.06*  CALCIUM 10.1 8.9   Liver Function Tests:  Recent Labs Lab 07/01/17 0314 07/02/17 0730  AST 14* 14*  ALT 13* 12*  ALKPHOS 71 61  BILITOT 0.8 0.8  PROT 7.0 5.9*  ALBUMIN 3.4* 2.9*   CBC:  Recent Labs Lab 07/01/17 0314  07/01/17 1941 07/01/17 2141 07/02/17 0730  WBC 7.9  --  8.9  --  7.9  HGB 11.5*  < > 9.1* 9.1* 8.3*  HCT 38.4*  < > 31.0* 28.4* 27.2*  MCV 93.0  --  92.0  --  91.0  PLT 278  --  258  --  260  < > = values in this interval not displayed.  CBG:  Recent Labs Lab 07/01/17 1605 07/01/17 2048 07/02/17 0014 07/02/17 0412 07/02/17 0844  GLUCAP 138* 121* 121* 117* 115*   Medications: . sodium chloride    . sodium chloride    . sodium chloride    . sodium chloride     . allopurinol  100 mg Oral Daily  . cinacalcet  90 mg Oral BID WC  . colesevelam  1,875 mg Oral BID WC  . ezetimibe  10 mg Oral Daily  . gabapentin  300 mg Oral QHS  . insulin aspart  0-9 Units Subcutaneous TID WC  . lanthanum  1,000 mg Oral TID WC  . methimazole  5 mg Oral Daily  . metoprolol tartrate  25 mg Oral 2 times per day on Sun Mon  Wed Fri  . midodrine  10 mg Oral Q T,Th,Sa-HD  . multivitamin  1 tablet Oral QHS  . pantoprazole  40 mg Oral QPM  . polyethylene glycol  17 g Oral TID  . sodium chloride flush  3 mL Intravenous Q12H    Dialysis Orders: TTS at [place], BFR 350, SFR 800, EDW 101.5kg, 2K/2Ca bath, AVF, Profile 1, Linear Na, 16g needles, no heparin. - Mircera 243mcg IV q 2 weeks - Hectoral 94mcg IV q HD - Home BMM meds: Sensipar 90mg , Phoslo 1/meals, Fosrenol pwdr 1/meals.  Assessment/Plan: 1.  Rectal bleeding: Bleeding continues, for flex sig today. Transfusing 1U PRBCs today. 2.  ESRD: Will continue HD per TTS schedule, dialyzing now.  3.  BP/volume: Hypotensive now with HD. On midodrine at home.  4.  Anemia: Hgb down to 8.3, 1U PRBCs. Will give Aranesp 273mcg today. 5.  Metabolic bone disease: Corr Ca slightly high. Resume Fosrenol/Sensipar once eating, hold VDRA/Phoslo. 6.  Nutrition:  Alb low, start supplements once eating. 7. CAD (s/p stents)/ICM: Off plavix currently. 8. Type 2 DM: On insulin, per primary. 9. Hyperthyroidism: On methimazole.  Veneta Penton, PA-C 07/02/2017, 10:33 AM  [facility] Pager: [phone]  Pt seen, examined and agree w A/P as above.  Kelly Splinter MD [employer] pager [phone]   07/02/2017, 11:27 AM

## 2017-07-02 NOTE — Anesthesia Postprocedure Evaluation (Signed)
Anesthesia Post Note  Patient: [name]  Procedure(s) Performed: FLEXIBLE SIGMOIDOSCOPY (Left )     Patient location during evaluation: PACU Anesthesia Type: MAC Level of consciousness: awake and alert and oriented Pain management: pain level controlled Vital Signs Assessment: post-procedure vital signs reviewed and stable Respiratory status: spontaneous breathing, nonlabored ventilation and respiratory function stable Cardiovascular status: stable and blood pressure returned to baseline Postop Assessment: no apparent nausea or vomiting Anesthetic complications: no    Last Vitals:  Vitals:   07/02/17 1505 07/02/17 1515  BP: (!) 99/46 (!) 101/50  Pulse: 85 83  Resp: 13 12  Temp:    SpO2: 96% 95%    Last Pain:  Vitals:   07/02/17 1459  TempSrc: Oral  PainSc:                  Otilio Groleau A.

## 2017-07-02 NOTE — Interval H&P Note (Signed)
History and Physical Interval Note:  07/02/2017 2:31 PM  [name]  has presented today for surgery, with the diagnosis of hematochezia  The various methods of treatment have been discussed with the patient and family. After consideration of risks, benefits and other options for treatment, the patient has consented to  Procedure(s): FLEXIBLE SIGMOIDOSCOPY (Left) as a surgical intervention .  The patient's history has been reviewed, patient examined, no change in status, stable for surgery.  I have reviewed the patient's chart and labs.  Questions were answered to the patient's satisfaction.     Romeo Zielinski JR,Sal L

## 2017-07-02 NOTE — H&P (View-Only) (Signed)
Subjective: Returns to ED with bleeding.  Objective: Vital signs in last 24 hours: Temp:  [99 F (37.2 C)] 99 F (37.2 C) (10/01 0322) Pulse Rate:  [63-83] 83 (10/01 1315) Resp:  [11-22] 16 (10/01 0911) BP: (83-153)/(49-84) 92/61 (10/01 1315) SpO2:  [93 %-99 %] 98 % (10/01 1315) Weight change:     PE:  GEN:  Chronically ill-appearing ABD:  Soft, protuberant, non-tender  Lab Results: CBC    Component Value Date/Time   WBC 7.9 07/01/2017 0314   RBC 4.13 (L) 07/01/2017 0314   HGB 11.0 (L) 07/01/2017 1052   HGB 11.7 (L) 11/20/2016 0842   HCT 35.2 (L) 07/01/2017 1052   HCT 35.6 (L) 11/20/2016 0842   PLT 278 07/01/2017 0314   PLT 304 11/20/2016 0842   MCV 93.0 07/01/2017 0314   MCV 93 11/20/2016 0842   MCH 27.8 07/01/2017 0314   MCHC 29.9 (L) 07/01/2017 0314   RDW 18.7 (H) 07/01/2017 0314   RDW 18.2 (H) 11/20/2016 0842   LYMPHSABS 1.7 05/03/2017 1612   MONOABS 1.1 (H) 05/03/2017 1612   EOSABS 0.2 05/03/2017 1612   BASOSABS 0.1 05/03/2017 1612   CMP     Component Value Date/Time   NA 140 07/01/2017 0314   NA 143 11/20/2016 0842   K 4.7 07/01/2017 0314   CL 101 07/01/2017 0314   CO2 25 07/01/2017 0314   GLUCOSE 164 (H) 07/01/2017 0314   BUN 46 (H) 07/01/2017 0314   BUN 59 (H) 11/20/2016 0842   CREATININE 10.04 (H) 07/01/2017 0314   CALCIUM 10.1 07/01/2017 0314   PROT 7.0 07/01/2017 0314   ALBUMIN 3.4 (L) 07/01/2017 0314   AST 14 (L) 07/01/2017 0314   ALT 13 (L) 07/01/2017 0314   ALKPHOS 71 07/01/2017 0314   BILITOT 0.8 07/01/2017 0314   GFRNONAA 5 (L) 07/01/2017 0314   GFRAA 5 (L) 07/01/2017 0314    Assessment:  1.  Recurrent hematochezia with clots.  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 2.  Anemia, acute on chronic.  Plan:  1.  Flexible sigmoidoscopy tomorrow for further evaluation. 2.  Prep with scheduled doses of Miralax; no enema due to suspected large distal rectal ulcer. 3.  If rampant  overt bleeding in the interval, consider tagged RBC study as next step in management. 4.  Next step in management pending flexible sigmoidoscopy findings. 5.  [facility] will follow.   [name] 07/01/2017, 1:41 PM   Cell [phone] If no answer or after 5 PM call [phone]

## 2017-07-02 NOTE — Progress Notes (Signed)
Pt arrived at the unit from endo at 1600.

## 2017-07-02 NOTE — Transfer of Care (Signed)
Immediate Anesthesia Transfer of Care Note  Patient: [name]  Procedure(s) Performed: FLEXIBLE SIGMOIDOSCOPY (Left )  Patient Location: Endoscopy Unit  Anesthesia Type:MAC  Level of Consciousness: awake, alert  and oriented  Airway & Oxygen Therapy: Patient Spontanous Breathing  Post-op Assessment: Report given to RN and Post -op Vital signs reviewed and stable  Post vital signs: Reviewed and stable  Last Vitals:  Vitals:   07/02/17 1129 07/02/17 1245  BP: (!) 106/56 (!) 101/44  Pulse: 82 90  Resp: 11 17  Temp: (!) 36.4 C 36.4 C  SpO2: 100% 100%    Last Pain:  Vitals:   07/02/17 1245  TempSrc: Oral  PainSc: 6          Complications: No apparent anesthesia complications

## 2017-07-02 NOTE — Progress Notes (Signed)
Pt arrived to [facility] [place] prior to change of shift on 10/1. Pt oriented to unit/rules/call light. Admission documentation completed by this RN. White bracelet on pt with correct identifiers. Restricted extremity bracelet to R arm d/t AV fistula (+bruit/+thrill). Family member upset that we do not use briefs at this facility-she was told she could bring in their own supply from home if they would like and that the staff would be checking in every 1-2 hours to see if the pt needs to be cleaned or pad needs to be changed. Charge nurse/AC made aware and spoke with pt and family. Paged for SCD's, however none available at this time. Skin is intact, assessment completed with Lelan Pons, RN. Will ctm.

## 2017-07-02 NOTE — Anesthesia Procedure Notes (Signed)
Procedure Name: MAC Date/Time: 07/02/2017 2:43 PM Performed by: Teressa Lower Pre-anesthesia Checklist: Patient identified, Emergency Drugs available, Suction available, Patient being monitored and Timeout performed Patient Re-evaluated:Patient Re-evaluated prior to induction Oxygen Delivery Method: Simple face mask

## 2017-07-02 NOTE — Anesthesia Preprocedure Evaluation (Signed)
Anesthesia Evaluation  Patient identified by MRN, date of birth, ID band Patient awake    Reviewed: Allergy & Precautions, NPO status , Patient's Chart, lab work & pertinent test results, reviewed documented beta blocker date and time   History of Anesthesia Complications Negative for: history of anesthetic complications  Airway Mallampati: I  TM Distance: >3 FB Neck ROM: Full    Dental  (+) Dental Advisory Given, Missing, Poor Dentition   Pulmonary shortness of breath, sleep apnea and Continuous Positive Airway Pressure Ventilation , COPD, former smoker,  pulm nodule   breath sounds clear to auscultation       Cardiovascular hypertension, Pt. on medications and Pt. on home beta blockers + CAD, + Past MI, + Cardiac Stents, + Peripheral Vascular Disease and +CHF  + dysrhythmias (s/p ablation) Supra Ventricular Tachycardia  Rhythm:Regular Rate:Normal  1/18 ECHO: EF 40-45%, valves OK   Neuro/Psych  Headaches, PSYCHIATRIC DISORDERS Depression  Neuromuscular disease    GI/Hepatic PUD, Bowel prep,GERD  Controlled,Elevated LFTs   Endo/Other  diabetesHyperthyroidism   Renal/GU ESRF and DialysisRenal disease (TuThSa)   Prostate cancer    Musculoskeletal  (+) Arthritis ,   Abdominal (+) + obese,   Peds  Hematology Hb 8.5   Anesthesia Other Findings   Reproductive/Obstetrics                             Anesthesia Physical  Anesthesia Plan  ASA: III  Anesthesia Plan: MAC   Post-op Pain Management:    Induction: Intravenous  PONV Risk Score and Plan: 1 and Ondansetron and Treatment may vary due to age or medical condition  Airway Management Planned: Natural Airway and Nasal Cannula  Additional Equipment:   Intra-op Plan:   Post-operative Plan:   Informed Consent: I have reviewed the patients History and Physical, chart, labs and discussed the procedure including the risks, benefits and  alternatives for the proposed anesthesia with the patient or authorized representative who has indicated his/her understanding and acceptance.   Dental advisory given  Plan Discussed with: CRNA and Surgeon  Anesthesia Plan Comments: (Plan routine monitors, MAC)        Anesthesia Quick Evaluation

## 2017-07-03 ENCOUNTER — Ambulatory Visit: Payer: Medicare Other | Admitting: Pulmonary Disease

## 2017-07-03 ENCOUNTER — Telehealth: Payer: Self-pay | Admitting: Neurology

## 2017-07-03 ENCOUNTER — Encounter (HOSPITAL_COMMUNITY): Payer: Self-pay | Admitting: Gastroenterology

## 2017-07-03 LAB — MRSA PCR SCREENING: MRSA BY PCR: NEGATIVE

## 2017-07-03 LAB — HEMOGLOBIN AND HEMATOCRIT, BLOOD
HCT: 28.5 % — ABNORMAL LOW (ref 39.0–52.0)
HCT: 26.4 % — ABNORMAL LOW (ref 39.0–52.0)
HEMOGLOBIN: 9 g/dL — AB (ref 13.0–17.0)
Hemoglobin: 8.1 g/dL — ABNORMAL LOW (ref 13.0–17.0)

## 2017-07-03 LAB — GLUCOSE, CAPILLARY
GLUCOSE-CAPILLARY: 75 mg/dL (ref 65–99)
GLUCOSE-CAPILLARY: 118 mg/dL — AB (ref 65–99)
GLUCOSE-CAPILLARY: 140 mg/dL — AB (ref 65–99)
Glucose-Capillary: 124 mg/dL — ABNORMAL HIGH (ref 65–99)
Glucose-Capillary: 130 mg/dL — ABNORMAL HIGH (ref 65–99)

## 2017-07-03 MED ORDER — DARBEPOETIN ALFA 200 MCG/0.4ML IJ SOSY
200.0000 ug | PREFILLED_SYRINGE | INTRAMUSCULAR | Status: DC
  Administered 2017-07-03: 200 ug via SUBCUTANEOUS
  Filled 2017-07-03: qty 0.4

## 2017-07-03 MED ORDER — MESALAMINE 1000 MG RE SUPP
500.0000 mg | Freq: Every day | RECTAL | Status: DC
  Filled 2017-07-03 (×2): qty 1

## 2017-07-03 MED ORDER — ZOLPIDEM TARTRATE 5 MG PO TABS
5.0000 mg | ORAL_TABLET | Freq: Every evening | ORAL | Status: DC | PRN
  Administered 2017-07-03 – 2017-07-04 (×3): 5 mg via ORAL
  Filled 2017-07-03 (×3): qty 1

## 2017-07-03 NOTE — Progress Notes (Signed)
[facility] Progress Note   Subjective:  Seen in room. No further rectal bleeding. Per GI note, "friability of rectal ulcer" seen on flex sigmoidoscopy (full note pending). No CP or dyspnea today. Has noticed some mild numbness to R fingers.  Objective Vitals:   07/02/17 1515 07/02/17 2113 07/03/17 0518 07/03/17 0828  BP: (!) 101/50 (!) 122/55 (!) 103/51 (!) 116/15  Pulse: 83 87 84 78  Resp: 12 17 16    Temp:  98.4 F (36.9 C) 98.2 F (36.8 C)   TempSrc:  Oral Oral   SpO2: 95% 100% 95%   Weight:      Height:       Physical Exam General: Well appearing male, NAD. Heart: RRR, no murmur Lungs: CTAB Abdomen: Soft, non-tender. Extremities: No LE edema Dialysis Access: AVF + thrill   Additional Objective Labs: Basic Metabolic Panel:  Recent Labs Lab 07/01/17 0314 07/02/17 0730  NA 140 138  K 4.7 5.0  CL 101 101  CO2 25 22  GLUCOSE 164* 125*  BUN 46* 61*  CREATININE 10.04* 12.06*  CALCIUM 10.1 8.9   Liver Function Tests:  Recent Labs Lab 07/01/17 0314 07/02/17 0730  AST 14* 14*  ALT 13* 12*  ALKPHOS 71 61  BILITOT 0.8 0.8  PROT 7.0 5.9*  ALBUMIN 3.4* 2.9*   CBC:  Recent Labs Lab 07/01/17 0314  07/01/17 1941  07/02/17 0730 07/02/17 1623 07/02/17 2135 07/03/17 0356  WBC 7.9  --  8.9  --  7.9  --   --   --   HGB 11.5*  < > 9.1*  < > 8.3* 9.3* 9.8* 9.0*  HCT 38.4*  < > 31.0*  < > 27.2* 30.7* 30.3* 28.5*  MCV 93.0  --  92.0  --  91.0  --   --   --   PLT 278  --  258  --  260  --   --   --   < > = values in this interval not displayed.  CBG:  Recent Labs Lab 07/02/17 1225 07/02/17 1620 07/02/17 2001 07/02/17 2357 07/03/17 0748  GLUCAP 94 75 132* 143* 118*   Medications: . sodium chloride Stopped (07/02/17 1445)  . sodium chloride     . allopurinol  100 mg Oral Daily  . cinacalcet  90 mg Oral BID WC  . colesevelam  1,875 mg Oral BID WC  . darbepoetin (ARANESP) injection - DIALYSIS  200 mcg Intravenous Q Tue-HD  . ezetimibe   10 mg Oral Daily  . gabapentin  300 mg Oral QHS  . insulin aspart  0-9 Units Subcutaneous TID WC  . lanthanum  1,000 mg Oral TID WC  . mesalamine  500 mg Rectal QHS  . methimazole  5 mg Oral Daily  . metoprolol tartrate  25 mg Oral 2 times per day on Sun Mon Wed Fri  . midodrine  10 mg Oral Q T,Th,Sa-HD  . multivitamin  1 tablet Oral QHS  . pantoprazole  40 mg Oral QPM  . sodium chloride flush  3 mL Intravenous Q12H    Dialysis Orders: TTS at [place], BFR 350, SFR 800, EDW 101.5kg, 2K/2Ca bath, AVF, Profile 1, Linear Na, 16g needles, no heparin. - Mircera 223mcg IV q 2 weeks - Hectoral 19mcg IV q HD - Home BMM meds: Sensipar 90mg , Phoslo 1/meals, Fosrenol pwdr 1/meals.  Assessment/Plan: 1. Rectal bleeding: Bleeding has stopped. S/p flex sig 10/2 with friable rectal ulcer. S/p 1U PRBCs 10/2. 2.  ESRD: Will continue HD per TTS schedule, next 10/4. No heparin. 3. BP/volume: BP stable. On midodrine at home.  4. Anemia: Hgb improved to 9 this morning, s/p 1U PRBCs. Looks like Aranesp was not given, will dose today subq. 5. Metabolic bone disease: Corr Ca high initially, improved now. Continue Fosrenol/Sensipar and hold VDRA/Phoslo. 6. Nutrition: Alb low, starting supps. 7. CAD (s/p stents)/ICM: Off plavix currently. 8. Type 2 DM: On insulin, per primary. 9. Hyperthyroidism: On methimazole. 10. L finger numbness: Hand is perfused. Likely related to anemia, monitor for now.  Veneta Penton, PA-C 07/03/2017, 10:46 AM  [facility] Pager: [phone]  Pt seen, examined and agree w A/P as above.  Kelly Splinter MD [employer] pager [phone]   07/03/2017, 12:44 PM

## 2017-07-03 NOTE — Telephone Encounter (Signed)
[place] called re: the prescription of oxyCODONE (OXY IR/ROXICODONE) 5 MG immediate release tablet, she is asking if the pain is chronic or acute, please return the call, pt is at pharmacy waiting

## 2017-07-03 NOTE — Progress Notes (Signed)
Subjective: Rectal bleeding has stopped. No abdominal pain.  Objective: Vital signs in last 24 hours: Temp:  [97.5 F (36.4 C)-98.4 F (36.9 C)] 98.2 F (36.8 C) (10/03 0518) Pulse Rate:  [78-90] 78 (10/03 0828) Resp:  [10-17] 16 (10/03 0518) BP: (73-122)/(15-56) 116/15 (10/03 0828) SpO2:  [94 %-100 %] 95 % (10/03 0518) Weight:  [227 lb 1.2 oz (103 kg)] 227 lb 1.2 oz (103 kg) (10/02 1129) Weight change: -5 lb 9.9 oz (-2.549 kg) Last BM Date: 07/01/17  PE: GEN:  NAD, chronically ill-appearing  Lab Results: CBC    Component Value Date/Time   WBC 7.9 07/02/2017 0730   RBC 2.99 (L) 07/02/2017 0730   HGB 9.0 (L) 07/03/2017 0356   HGB 11.7 (L) 11/20/2016 0842   HCT 28.5 (L) 07/03/2017 0356   HCT 35.6 (L) 11/20/2016 0842   PLT 260 07/02/2017 0730   PLT 304 11/20/2016 0842   MCV 91.0 07/02/2017 0730   MCV 93 11/20/2016 0842   MCH 27.8 07/02/2017 0730   MCHC 30.5 07/02/2017 0730   RDW 18.4 (H) 07/02/2017 0730   RDW 18.2 (H) 11/20/2016 0842   LYMPHSABS 1.7 05/03/2017 1612   MONOABS 1.1 (H) 05/03/2017 1612   EOSABS 0.2 05/03/2017 1612   BASOSABS 0.1 05/03/2017 1612   CMP     Component Value Date/Time   NA 138 07/02/2017 0730   NA 143 11/20/2016 0842   K 5.0 07/02/2017 0730   CL 101 07/02/2017 0730   CO2 22 07/02/2017 0730   GLUCOSE 125 (H) 07/02/2017 0730   BUN 61 (H) 07/02/2017 0730   BUN 59 (H) 11/20/2016 0842   CREATININE 12.06 (H) 07/02/2017 0730   CALCIUM 8.9 07/02/2017 0730   PROT 5.9 (L) 07/02/2017 0730   ALBUMIN 2.9 (L) 07/02/2017 0730   AST 14 (L) 07/02/2017 0730   ALT 12 (L) 07/02/2017 0730   ALKPHOS 61 07/02/2017 0730   BILITOT 0.8 07/02/2017 0730   GFRNONAA 4 (L) 07/02/2017 0730   GFRAA 4 (L) 07/02/2017 0730   Assessment:  1.  Rectal bleeding, sounds like friability of rectal ulcer from prior APC treatment for radiation proctopathy.  No further bleeding. 2.  Anemia, acute on chronic.  Plan:  1.  Follow CBCs. 2.  Advance diet. 3.  Continue  Canasa suppositories, now and upon hospital discharge. 4.  If patient has ongoing bleeding as outpatient despite mesalamine enemas, could consider sucralfate enemas or even hyperbaric oxygen therapy are other options. 5.  Maybe patient can be discharged home tomorrow from GI perspective.   Landry Dyke 07/03/2017, 10:18 AM   Cell [phone] If no answer or after 5 PM call [phone]

## 2017-07-03 NOTE — Progress Notes (Addendum)
Nutrition Brief Note  Patient identified on the Malnutrition Screening Tool (MST) Report  Wt Readings from Last 15 Encounters:  07/02/17 227 lb 1.2 oz (103 kg)  06/12/17 226 lb (102.5 kg)  05/29/17 226 lb 3.2 oz (102.6 kg)  05/22/17 225 lb 14.4 oz (102.5 kg)  05/15/17 232 lb 3.2 oz (105.3 kg)  05/03/17 232 lb (105.2 kg)  04/27/17 231 lb 7.7 oz (105 kg)  03/25/17 238 lb (108 kg)  02/18/17 235 lb (106.6 kg)  01/23/17 224 lb 9.6 oz (101.9 kg)  01/14/17 235 lb 12 oz (106.9 kg)  01/09/17 236 lb (107 kg)  12/28/16 232 lb 11.2 oz (105.6 kg)  12/26/16 235 lb 3.2 oz (106.7 kg)  12/12/16 235 lb (106.6 kg)   [name]  is a 66 y.o. male, w dm2, ESRD on HD (T, T, S), hypertension, hyperlipidemia, h/o prostate cancer, h/o rectal bleeding apparently c/o rectal bleeding was worse today and therefore presented to ED.  Pt admitted with rectal bleeding.   Spoke with pt, who reports fair appetite. Per meal completion records, consumed 100% of breakfast. Pt reports he generally consumes 3 large meals per day (ex breakfast of toast, grits, coffee, eggs, ham, and sausage).   Pt reports UBW around 230#. He shares that he lost 30# over an indefinite period of time, which he attributes to a prior hospitalization and dissatisfaction of being on a renal/carb modified diet. However, wt hx reveals wt stability over the several years.  Nutrition-Focused physical exam completed. Findings are no fat depletion, no muscle depletion, and no edema. Pt complains of decrease mobility due to leg pain as a result of a pinched nerve in his lower back; he is scheduled to see neurosurgery as an outpatient.   Body mass index is 29.96 kg/m. Patient meets criteria for overweight based on current BMI.   Current diet order is renal/carb modified, patient is consuming approximately 100% of meals at this time. Labs and medications reviewed.   No nutrition interventions warranted at this time. If nutrition issues arise, please  consult RD.   [name], RD, LDN, CDE Pager: [phone] After hours Pager: [phone]

## 2017-07-03 NOTE — Progress Notes (Signed)
Patient has home cpap and places on himself. Will continue to monitor.

## 2017-07-03 NOTE — Telephone Encounter (Signed)
Called and spoke with [facility]. She transferred me to pharmacist. Pharmacist stated pt took rx and went to different pharmacy. She did not need any information at this time. Nothing further needed.

## 2017-07-03 NOTE — Progress Notes (Signed)
Progress Note    [name]  LFY:[id number] DOB: 10-29-1950  DOA: 07/01/2017 PCP: Biagio Borg, MD    Brief Narrative:   Chief complaint: F/U rectal bleeding  Medical records reviewed and are as summarized below:  [name] is an 66 y.o. male with a PMH of type 2 diabetes, ESRD on HD (T/T/S), hypertension, hyperlipidemia and history of prostate cancer who was admitted 07/01/17 for evaluation of acute rectal bleeding. GI consulted.  Assessment/Plan:   Principal Problem:   Rectal bleeding/radiation proctopathy and ulcer -no further active bleeding noted at this time, status post flexible sigmoidoscopy 10/2 -Hemoglobin remained stable -Appreciate [facility] input -Continue Canasa suppositories -Check CBC in am  Acute blood loss anemia/Hypotension -status post 1 unit PRBC transfusion, hemoglobin since stable -Check CBC in a.m.    DM (diabetes mellitus) type II controlled with renal manifestation ([name])/neuropathy -continue sliding scale insulin -stable    ESRD on hemodialysis ([name])/secondary hyperparathyroidism/metabolic bone disease -renal following, plan for HD early tomorrow   Family Communication/Anticipated D/C date and plan/Code Status   DVT prophylaxis: SCDs ordered. Code Status: Full Code.  Family Communication: no family at bedside Disposition Plan: Home tomorrow stable  Medical Consultants:    Gastroenterology  Nephrology   Anti-Infectives:    None  Subjective:  No bleeding overnight, trace streak in BM  Objective:    Vitals:   07/02/17 1515 07/02/17 2113 07/03/17 0518 07/03/17 0828  BP: (!) 101/50 (!) 122/55 (!) 103/51 (!) 116/15  Pulse: 83 87 84 78  Resp: 12 17 16    Temp:  98.4 F (36.9 C) 98.2 F (36.8 C)   TempSrc:  Oral Oral   SpO2: 95% 100% 95%   Weight:      Height:        Intake/Output Summary (Last 24 hours) at 07/03/17 1353 Last data filed at 07/03/17 0938  Gross per 24 hour  Intake              440  ml  Output                0 ml  Net              440 ml   Filed Weights   07/01/17 2051 07/02/17 0725 07/02/17 1129  Weight: 102.6 kg (226 lb 4.8 oz) 100.1 kg (220 lb 10.9 oz) 103 kg (227 lb 1.2 oz)    Exam: Gen: Awake, Alert, Oriented X 3, pleasant HEENT: PERRLA, Neck supple, no JVD Lungs: Good air movement bilaterally, CTAB CVS: RRR,No Gallops,Rubs or new Murmurs Abd: soft, Non tender, non distended, BS present Extremities: No Cyanosis, Clubbing or edema Skin: no new rashes   Data Reviewed:   I have personally reviewed following labs and imaging studies:  Labs: Labs show the following:   Basic Metabolic Panel:  Recent Labs Lab 07/01/17 0314 07/02/17 0730  NA 140 138  K 4.7 5.0  CL 101 101  CO2 25 22  GLUCOSE 164* 125*  BUN 46* 61*  CREATININE 10.04* 12.06*  CALCIUM 10.1 8.9   GFR Estimated Creatinine Clearance: 7.4 mL/min (A) (by C-G formula based on SCr of 12.06 mg/dL (H)). Liver Function Tests:  Recent Labs Lab 07/01/17 0314 07/02/17 0730  AST 14* 14*  ALT 13* 12*  ALKPHOS 71 61  BILITOT 0.8 0.8  PROT 7.0 5.9*  ALBUMIN 3.4* 2.9*   CBC:  Recent Labs Lab 07/01/17 0314  07/01/17 1941  07/02/17 0730 07/02/17 1623 07/02/17  2135 07/03/17 0356 07/03/17 1213  WBC 7.9  --  8.9  --  7.9  --   --   --   --   HGB 11.5*  < > 9.1*  < > 8.3* 9.3* 9.8* 9.0* 8.1*  HCT 38.4*  < > 31.0*  < > 27.2* 30.7* 30.3* 28.5* 26.4*  MCV 93.0  --  92.0  --  91.0  --   --   --   --   PLT 278  --  258  --  260  --   --   --   --   < > = values in this interval not displayed. CBG:  Recent Labs Lab 07/02/17 1620 07/02/17 2001 07/02/17 2357 07/03/17 0748 07/03/17 1205  GLUCAP 75 132* 143* 118* 140*    Microbiology Recent Results (from the past 240 hour(s))  MRSA PCR Screening     Status: None   Collection Time: 07/03/17  8:37 AM  Result Value Ref Range Status   MRSA by PCR NEGATIVE NEGATIVE Final    Comment:        The GeneXpert MRSA Assay (FDA approved  for NASAL specimens only), is one component of a comprehensive MRSA colonization surveillance program. It is not intended to diagnose MRSA infection nor to guide or monitor treatment for MRSA infections.     Procedures and diagnostic studies:  No results found.  Medications:   . allopurinol  100 mg Oral Daily  . cinacalcet  90 mg Oral BID WC  . colesevelam  1,875 mg Oral BID WC  . darbepoetin (ARANESP) injection - NON-DIALYSIS  200 mcg Subcutaneous Q Wed-1800  . ezetimibe  10 mg Oral Daily  . gabapentin  300 mg Oral QHS  . insulin aspart  0-9 Units Subcutaneous TID WC  . lanthanum  1,000 mg Oral TID WC  . mesalamine  500 mg Rectal QHS  . methimazole  5 mg Oral Daily  . metoprolol tartrate  25 mg Oral 2 times per day on Sun Mon Wed Fri  . midodrine  10 mg Oral Q T,Th,Sa-HD  . multivitamin  1 tablet Oral QHS  . pantoprazole  40 mg Oral QPM  . sodium chloride flush  3 mL Intravenous Q12H   Continuous Infusions: . sodium chloride Stopped (07/02/17 1445)  . sodium chloride       LOS: 2 days   [facility] Page: via [email or web address], password [email or web address]  If 7PM-7AM, please contact night-coverage at [email or web address], password [email or web address] for any overnight needs.  07/03/2017, 1:53 PM

## 2017-07-04 LAB — GLUCOSE, CAPILLARY
Glucose-Capillary: 100 mg/dL — ABNORMAL HIGH (ref 65–99)
Glucose-Capillary: 114 mg/dL — ABNORMAL HIGH (ref 65–99)
Glucose-Capillary: 105 mg/dL — ABNORMAL HIGH (ref 65–99)

## 2017-07-04 LAB — RENAL FUNCTION PANEL
ALBUMIN: 3 g/dL — AB (ref 3.5–5.0)
Anion gap: 13 (ref 5–15)
BUN: 38 mg/dL — AB (ref 6–20)
CALCIUM: 8.4 mg/dL — AB (ref 8.9–10.3)
CO2: 26 mmol/L (ref 22–32)
Chloride: 99 mmol/L — ABNORMAL LOW (ref 101–111)
Creatinine, Ser: 10.44 mg/dL — ABNORMAL HIGH (ref 0.61–1.24)
GFR calc Af Amer: 5 mL/min — ABNORMAL LOW (ref >60)
GFR calc non Af Amer: 4 mL/min — ABNORMAL LOW (ref >60)
GLUCOSE: 139 mg/dL — AB (ref 65–99)
PHOSPHORUS: 6.7 mg/dL — AB (ref 2.5–4.6)
Potassium: 3.9 mmol/L (ref 3.5–5.1)
SODIUM: 138 mmol/L (ref 135–145)

## 2017-07-04 LAB — CBC
HCT: 24.5 % — ABNORMAL LOW (ref 39.0–52.0)
HEMOGLOBIN: 7.5 g/dL — AB (ref 13.0–17.0)
MCH: 27.7 pg (ref 26.0–34.0)
MCHC: 30.6 g/dL (ref 30.0–36.0)
MCV: 90.4 fL (ref 78.0–100.0)
Platelets: 243 10*3/uL (ref 150–400)
RBC: 2.71 MIL/uL — ABNORMAL LOW (ref 4.22–5.81)
RDW: 18 % — AB (ref 11.5–15.5)
WBC: 6.4 10*3/uL (ref 4.0–10.5)

## 2017-07-04 LAB — PREPARE RBC (CROSSMATCH)

## 2017-07-04 MED ORDER — SODIUM CHLORIDE 0.9 % IV SOLN: Freq: Once | INTRAVENOUS | Status: DC

## 2017-07-04 NOTE — Progress Notes (Signed)
[name] Progress Note   Subjective:  Hb 7.5 today, pt on HD, no c/o's.   Objective Vitals:   07/04/17 0900 07/04/17 0930 07/04/17 1000 07/04/17 1015  BP: 138/70 (!) 141/67 136/70 116/67  Pulse: 69 70 72 74  Resp: 18 18 18 18   Temp:    97.7 F (36.5 C)  TempSrc:      SpO2:      Weight:      Height:       Physical Exam General: Well appearing male, NAD. Heart: RRR, no murmur Lungs: CTAB Abdomen: Soft, non-tender. Extremities: No LE edema Dialysis Access: AVF + thrill   Additional Objective Labs: Basic Metabolic Panel:  Recent Labs Lab 07/01/17 0314 07/02/17 0730 07/04/17 0647  NA 140 138 138  K 4.7 5.0 3.9  CL 101 101 99*  CO2 25 22 26   GLUCOSE 164* 125* 139*  BUN 46* 61* 38*  CREATININE 10.04* 12.06* 10.44*  CALCIUM 10.1 8.9 8.4*  PHOS  --   --  6.7*   Liver Function Tests:  Recent Labs Lab 07/01/17 0314 07/02/17 0730 07/04/17 0647  AST 14* 14*  --   ALT 13* 12*  --   ALKPHOS 71 61  --   BILITOT 0.8 0.8  --   PROT 7.0 5.9*  --   ALBUMIN 3.4* 2.9* 3.0*   CBC:  Recent Labs Lab 07/01/17 0314  07/01/17 1941  07/02/17 0730  07/03/17 0356 07/03/17 1213 07/04/17 0647  WBC 7.9  --  8.9  --  7.9  --   --   --  6.4  HGB 11.5*  < > 9.1*  < > 8.3*  < > 9.0* 8.1* 7.5*  HCT 38.4*  < > 31.0*  < > 27.2*  < > 28.5* 26.4* 24.5*  MCV 93.0  --  92.0  --  91.0  --   --   --  90.4  PLT 278  --  258  --  260  --   --   --  243  < > = values in this interval not displayed.  CBG:  Recent Labs Lab 07/02/17 2357 07/03/17 0748 07/03/17 1205 07/03/17 1632 07/03/17 2115  GLUCAP 143* 118* 140* 124* 130*   Medications: . sodium chloride Stopped (07/02/17 1445)  . sodium chloride    . sodium chloride     . allopurinol  100 mg Oral Daily  . cinacalcet  90 mg Oral BID WC  . colesevelam  1,875 mg Oral BID WC  . darbepoetin (ARANESP) injection - NON-DIALYSIS  200 mcg Subcutaneous Q Wed-1800  . ezetimibe  10 mg Oral Daily  . gabapentin   300 mg Oral QHS  . insulin aspart  0-9 Units Subcutaneous TID WC  . lanthanum  1,000 mg Oral TID WC  . mesalamine  500 mg Rectal QHS  . methimazole  5 mg Oral Daily  . metoprolol tartrate  25 mg Oral 2 times per day on Sun Mon Wed Fri  . midodrine  10 mg Oral Q T,Th,Sa-HD  . multivitamin  1 tablet Oral QHS  . pantoprazole  40 mg Oral QPM  . sodium chloride flush  3 mL Intravenous Q12H    Dialysis Orders: TTS at [facility], BFR 350, SFR 800, EDW 101.5kg, 2K/2Ca bath, AVF, Profile 1, Linear Na, 16g needles, no heparin. - Mircera 295mcg IV q 2 weeks - Hectoral 13mcg IV q HD - Home BMM meds: Sensipar 90mg , Phoslo 1/meals, Fosrenol pwdr  1/meals.  Assessment/Plan: 1. GIB/ rectal ulcer on flex sig / rectal bleeding: Bleeding has stopped. S/p flex sig 10/2 with friable rectal ulcer. S/p 1U PRBCs 10/2. To get 1 more unit prbc on HD today.  2. ESRD: cont HD TTS. No heparin as at home.  3. BP/volume: BP stable. On midodrine at home.  4. Anemia of CKD/ ABL: as above. Gave darbe 10/3 here.  5. MBD: Corr Ca high initially, improved now. Continue Fosrenol/Sensipar and hold VDRA/Phoslo. 6. Nutrition: Alb low, starting supps. 7. CAD (s/p stents)/ICM: Off plavix currently. 8. Type 2 DM: On insulin, per primary. 9. Hyperthyroidism: On methimazole. 10. L finger numbness: Hand is perfused. Likely related to anemia, monitor for now.    Kelly Splinter MD [employer] pager [phone]   07/04/2017, 10:23 AM

## 2017-07-04 NOTE — Progress Notes (Signed)
No further bleeding.  Hgb did drop to 7.5 and he received one unit of blood in dialysis.  Suggest continue canasa suppositories and consider discharge home tomorrow with outpatient follow-up with me in 2 weeks.

## 2017-07-04 NOTE — Progress Notes (Signed)
Progress Note    [name]  URK:[id number] DOB: 21-Jul-1951  DOA: 07/01/2017 PCP: [name] Borg, MD    Brief Narrative:   Chief complaint: F/U rectal bleeding  Medical records reviewed and are as summarized below:  [name] is an 66 y.o. male with a PMH of type 2 diabetes, ESRD on HD (T/T/S), hypertension, hyperlipidemia and history of prostate cancer who was admitted 07/01/17 for evaluation of acute rectal bleeding. GI consulted.  Assessment/Plan:   Rectal bleeding/radiation proctopathy and ulcer -no further active bleeding noted at this time, status post flexible sigmoidoscopy 10/2 -Hemoglobin dropped today, will transfuse 1 unit PRBC now -Appreciate [name] input -Continue Canasa suppositories -CBC in am  Acute blood loss anemia/Hypotension -status post 1 unit PRBC transfusion, hemoglobin since stable -will transfuse another unit now    DM (diabetes mellitus) type II controlled with renal manifestation ([name])/neuropathy -continue sliding scale insulin -stable    ESRD on hemodialysis ([name])/secondary hyperparathyroidism/metabolic bone disease -renal following, HD today   Family Communication/Anticipated D/C date and plan/Code Status   DVT prophylaxis: SCDs ordered. Code Status: Full Code.  Family Communication: no family at bedside Disposition Plan: Home tomorrow stable  Medical Consultants:    Gastroenterology  Nephrology   Anti-Infectives:    None  Subjective:  -no complaints, no further bleeding  Objective:    Vitals:   07/04/17 1059 07/04/17 1130 07/04/17 1200 07/04/17 1214  BP: 132/63 110/62 125/61 124/73  Pulse: 65 64 68 68  Resp: 18 18 17 18   Temp: 97.7 F (36.5 C)   97.9 F (36.6 C)  TempSrc:    Oral  SpO2:    95%  Weight:    100.7 kg (222 lb 0.1 oz)  Height:        Intake/Output Summary (Last 24 hours) at 07/04/17 1436 Last data filed at 07/04/17 1214  Gross per 24 hour  Intake              496 ml    Output             1500 ml  Net            -1004 ml   Filed Weights   07/02/17 1129 07/04/17 0755 07/04/17 1214  Weight: 103 kg (227 lb 1.2 oz) 102.2 kg (225 lb 5 oz) 100.7 kg (222 lb 0.1 oz)    Exam:  Gen: Awake, Alert, Oriented X 3,  HEENT: PERRLA, Neck supple, no JVD Lungs: Good air movement bilaterally, CTAB CVS: RRR,No Gallops,Rubs or new Murmurs Abd: soft, Non tender, non distended, BS present Extremities: No Cyanosis, Clubbing or edema Skin: no new rashes   Data Reviewed:   I have personally reviewed following labs and imaging studies:  Labs: Labs show the following:   Basic Metabolic Panel:  Recent Labs Lab 07/01/17 0314 07/02/17 0730 07/04/17 0647  NA 140 138 138  K 4.7 5.0 3.9  CL 101 101 99*  CO2 25 22 26   GLUCOSE 164* 125* 139*  BUN 46* 61* 38*  CREATININE 10.04* 12.06* 10.44*  CALCIUM 10.1 8.9 8.4*  PHOS  --   --  6.7*   GFR Estimated Creatinine Clearance: 8.4 mL/min (A) (by C-G formula based on SCr of 10.44 mg/dL (H)). Liver Function Tests:  Recent Labs Lab 07/01/17 0314 07/02/17 0730 07/04/17 0647  AST 14* 14*  --   ALT 13* 12*  --   ALKPHOS 71 61  --   BILITOT 0.8 0.8  --  PROT 7.0 5.9*  --   ALBUMIN 3.4* 2.9* 3.0*   CBC:  Recent Labs Lab 07/01/17 0314  07/01/17 1941  07/02/17 0730 07/02/17 1623 07/02/17 2135 07/03/17 0356 07/03/17 1213 07/04/17 0647  WBC 7.9  --  8.9  --  7.9  --   --   --   --  6.4  HGB 11.5*  < > 9.1*  < > 8.3* 9.3* 9.8* 9.0* 8.1* 7.5*  HCT 38.4*  < > 31.0*  < > 27.2* 30.7* 30.3* 28.5* 26.4* 24.5*  MCV 93.0  --  92.0  --  91.0  --   --   --   --  90.4  PLT 278  --  258  --  260  --   --   --   --  243  < > = values in this interval not displayed. CBG:  Recent Labs Lab 07/03/17 0748 07/03/17 1205 07/03/17 1632 07/03/17 2115 07/04/17 1234  GLUCAP 118* 140* 124* 130* 100*    Microbiology Recent Results (from the past 240 hour(s))  MRSA PCR Screening     Status: None   Collection Time:  07/03/17  8:37 AM  Result Value Ref Range Status   MRSA by PCR NEGATIVE NEGATIVE Final    Comment:        The GeneXpert MRSA Assay (FDA approved for NASAL specimens only), is one component of a comprehensive MRSA colonization surveillance program. It is not intended to diagnose MRSA infection nor to guide or monitor treatment for MRSA infections.     Procedures and diagnostic studies:  No results found.  Medications:   . allopurinol  100 mg Oral Daily  . cinacalcet  90 mg Oral BID WC  . colesevelam  1,875 mg Oral BID WC  . darbepoetin (ARANESP) injection - NON-DIALYSIS  200 mcg Subcutaneous Q Wed-1800  . ezetimibe  10 mg Oral Daily  . gabapentin  300 mg Oral QHS  . insulin aspart  0-9 Units Subcutaneous TID WC  . lanthanum  1,000 mg Oral TID WC  . mesalamine  500 mg Rectal QHS  . methimazole  5 mg Oral Daily  . metoprolol tartrate  25 mg Oral 2 times per day on Sun Mon Wed Fri  . midodrine  10 mg Oral Q T,Th,Sa-HD  . multivitamin  1 tablet Oral QHS  . pantoprazole  40 mg Oral QPM  . sodium chloride flush  3 mL Intravenous Q12H   Continuous Infusions: . sodium chloride Stopped (07/02/17 1445)  . sodium chloride    . sodium chloride       LOS: 3 days   [name]  [facility] Page: via [email or web address], password [email or web address]  If 7PM-7AM, please contact night-coverage at [email or web address], password [email or web address] for any overnight needs.  07/04/2017, 2:36 PM

## 2017-07-05 LAB — CBC
HEMATOCRIT: 29.1 % — AB (ref 39.0–52.0)
HEMOGLOBIN: 9 g/dL — AB (ref 13.0–17.0)
MCH: 28 pg (ref 26.0–34.0)
MCHC: 30.9 g/dL (ref 30.0–36.0)
MCV: 90.7 fL (ref 78.0–100.0)
Platelets: 253 10*3/uL (ref 150–400)
RBC: 3.21 MIL/uL — AB (ref 4.22–5.81)
RDW: 18 % — AB (ref 11.5–15.5)
WBC: 6.9 10*3/uL (ref 4.0–10.5)

## 2017-07-05 LAB — TYPE AND SCREEN
ABO/RH(D): O POS
Antibody Screen: NEGATIVE
UNIT DIVISION: 0
Unit division: 0

## 2017-07-05 LAB — BASIC METABOLIC PANEL
ANION GAP: 10 (ref 5–15)
BUN: 30 mg/dL — ABNORMAL HIGH (ref 6–20)
CALCIUM: 8.5 mg/dL — AB (ref 8.9–10.3)
CHLORIDE: 100 mmol/L — AB (ref 101–111)
CO2: 26 mmol/L (ref 22–32)
Creatinine, Ser: 8.77 mg/dL — ABNORMAL HIGH (ref 0.61–1.24)
GFR calc Af Amer: 6 mL/min — ABNORMAL LOW (ref >60)
GFR calc non Af Amer: 5 mL/min — ABNORMAL LOW (ref >60)
GLUCOSE: 109 mg/dL — AB (ref 65–99)
POTASSIUM: 3.9 mmol/L (ref 3.5–5.1)
Sodium: 136 mmol/L (ref 135–145)

## 2017-07-05 LAB — BPAM RBC
Blood Product Expiration Date: 201810192359
Blood Product Expiration Date: 201810192359
ISSUE DATE / TIME: 201810021036
ISSUE DATE / TIME: 201810041016
UNIT TYPE AND RH: 5100
Unit Type and Rh: 5100

## 2017-07-05 LAB — GLUCOSE, CAPILLARY
Glucose-Capillary: 100 mg/dL — ABNORMAL HIGH (ref 65–99)
Glucose-Capillary: 129 mg/dL — ABNORMAL HIGH (ref 65–99)

## 2017-07-05 MED ORDER — MESALAMINE 1000 MG RE SUPP: 500.0000 mg | Freq: Every day | RECTAL | 2 refills | Status: DC

## 2017-07-05 MED ORDER — DARBEPOETIN ALFA 200 MCG/0.4ML IJ SOSY: 200.0000 ug | PREFILLED_SYRINGE | INTRAMUSCULAR | Status: DC

## 2017-07-05 MED ORDER — GABAPENTIN 300 MG PO CAPS: 300.0000 mg | ORAL_CAPSULE | Freq: Every day | ORAL | Status: DC

## 2017-07-05 NOTE — Progress Notes (Signed)
  Pt wear home CPAP and he did not need assistance with it.

## 2017-07-05 NOTE — Care Management Important Message (Signed)
Important Message  Patient Details  Name: [name] MRN: [id number] Date of Birth: 23-Apr-1951   Medicare Important Message Given:  Yes    [name] 07/05/2017, 9:59 AM

## 2017-07-05 NOTE — Discharge Summary (Signed)
Pt discharged home with family/ AVS discussed with patient and wife at bedside, Pt alert and oriented a d/c. And going home in private vehicle

## 2017-07-05 NOTE — Progress Notes (Signed)
Pt did not wear CPAP.  RT will continue to monitor

## 2017-07-05 NOTE — Progress Notes (Signed)
GI Discharge/Sign-Off Note  Subjective: patient without further bleeding.Has rectal ulcer due to previous treatment of radiation proctitis with both APC and RFA. Bleeding apparently is stopped he has been using Canasa suppository QHS  Principal Problem:   Rectal bleeding Active Problems:   Anemia associated with acute blood loss   DM (diabetes mellitus) type II controlled with renal manifestation ([name])   ESRD on hemodialysis ([place])   Results for orders placed or performed during the hospital encounter of 07/01/17 (from the past 72 hour(s))  Glucose, capillary     Status: None   Collection Time: 07/02/17 12:25 PM  Result Value Ref Range   Glucose-Capillary 94 65 - 99 mg/dL  Glucose, capillary     Status: None   Collection Time: 07/02/17  4:20 PM  Result Value Ref Range   Glucose-Capillary 75 65 - 99 mg/dL  Hemoglobin and hematocrit, blood     Status: Abnormal   Collection Time: 07/02/17  4:23 PM  Result Value Ref Range   Hemoglobin 9.3 (L) 13.0 - 17.0 g/dL   HCT 30.7 (L) 39.0 - 52.0 %  Glucose, capillary     Status: Abnormal   Collection Time: 07/02/17  8:01 PM  Result Value Ref Range   Glucose-Capillary 132 (H) 65 - 99 mg/dL  Hemoglobin and hematocrit, blood     Status: Abnormal   Collection Time: 07/02/17  9:35 PM  Result Value Ref Range   Hemoglobin 9.8 (L) 13.0 - 17.0 g/dL   HCT 30.3 (L) 39.0 - 52.0 %  Glucose, capillary     Status: Abnormal   Collection Time: 07/02/17 11:57 PM  Result Value Ref Range   Glucose-Capillary 143 (H) 65 - 99 mg/dL  Hemoglobin and hematocrit, blood     Status: Abnormal   Collection Time: 07/03/17  3:56 AM  Result Value Ref Range   Hemoglobin 9.0 (L) 13.0 - 17.0 g/dL   HCT 28.5 (L) 39.0 - 52.0 %  Glucose, capillary     Status: Abnormal   Collection Time: 07/03/17  7:48 AM  Result Value Ref Range   Glucose-Capillary 118 (H) 65 - 99 mg/dL  MRSA PCR Screening     Status: None   Collection Time: 07/03/17  8:37 AM  Result Value Ref Range    MRSA by PCR NEGATIVE NEGATIVE    Comment:        The GeneXpert MRSA Assay (FDA approved for NASAL specimens only), is one component of a comprehensive MRSA colonization surveillance program. It is not intended to diagnose MRSA infection nor to guide or monitor treatment for MRSA infections.   Glucose, capillary     Status: Abnormal   Collection Time: 07/03/17 12:05 PM  Result Value Ref Range   Glucose-Capillary 140 (H) 65 - 99 mg/dL  Hemoglobin and hematocrit, blood     Status: Abnormal   Collection Time: 07/03/17 12:13 PM  Result Value Ref Range   Hemoglobin 8.1 (L) 13.0 - 17.0 g/dL   HCT 26.4 (L) 39.0 - 52.0 %  Glucose, capillary     Status: Abnormal   Collection Time: 07/03/17  4:32 PM  Result Value Ref Range   Glucose-Capillary 124 (H) 65 - 99 mg/dL  Glucose, capillary     Status: Abnormal   Collection Time: 07/03/17  9:15 PM  Result Value Ref Range   Glucose-Capillary 130 (H) 65 - 99 mg/dL  CBC     Status: Abnormal   Collection Time: 07/04/17  6:47 AM  Result Value Ref Range  WBC 6.4 4.0 - 10.5 K/uL   RBC 2.71 (L) 4.22 - 5.81 MIL/uL   Hemoglobin 7.5 (L) 13.0 - 17.0 g/dL   HCT 24.5 (L) 39.0 - 52.0 %   MCV 90.4 78.0 - 100.0 fL   MCH 27.7 26.0 - 34.0 pg   MCHC 30.6 30.0 - 36.0 g/dL   RDW 18.0 (H) 11.5 - 15.5 %   Platelets 243 150 - 400 K/uL  Renal function panel     Status: Abnormal   Collection Time: 07/04/17  6:47 AM  Result Value Ref Range   Sodium 138 135 - 145 mmol/L   Potassium 3.9 3.5 - 5.1 mmol/L   Chloride 99 (L) 101 - 111 mmol/L   CO2 26 22 - 32 mmol/L   Glucose, Bld 139 (H) 65 - 99 mg/dL   BUN 38 (H) 6 - 20 mg/dL   Creatinine, Ser 10.44 (H) 0.61 - 1.24 mg/dL   Calcium 8.4 (L) 8.9 - 10.3 mg/dL   Phosphorus 6.7 (H) 2.5 - 4.6 mg/dL   Albumin 3.0 (L) 3.5 - 5.0 g/dL   GFR calc non Af Amer 4 (L) >60 mL/min   GFR calc Af Amer 5 (L) >60 mL/min    Comment: (NOTE) The eGFR has been calculated using the CKD EPI equation. This calculation has not been  validated in all clinical situations. eGFR's persistently <60 mL/min signify possible Chronic Kidney Disease.    Anion gap 13 5 - 15  Prepare RBC     Status: None   Collection Time: 07/04/17  9:10 AM  Result Value Ref Range   Order Confirmation      ORDER PROCESSED BY BLOOD BANK BB SAMPLE OR UNITS ALREADY AVAILABLE  Glucose, capillary     Status: Abnormal   Collection Time: 07/04/17 12:34 PM  Result Value Ref Range   Glucose-Capillary 100 (H) 65 - 99 mg/dL  Glucose, capillary     Status: Abnormal   Collection Time: 07/04/17  5:01 PM  Result Value Ref Range   Glucose-Capillary 114 (H) 65 - 99 mg/dL  Glucose, capillary     Status: Abnormal   Collection Time: 07/04/17  9:31 PM  Result Value Ref Range   Glucose-Capillary 105 (H) 65 - 99 mg/dL  CBC     Status: Abnormal   Collection Time: 07/05/17  5:32 AM  Result Value Ref Range   WBC 6.9 4.0 - 10.5 K/uL   RBC 3.21 (L) 4.22 - 5.81 MIL/uL   Hemoglobin 9.0 (L) 13.0 - 17.0 g/dL   HCT 29.1 (L) 39.0 - 52.0 %   MCV 90.7 78.0 - 100.0 fL   MCH 28.0 26.0 - 34.0 pg   MCHC 30.9 30.0 - 36.0 g/dL   RDW 18.0 (H) 11.5 - 15.5 %   Platelets 253 150 - 400 K/uL  Basic metabolic panel     Status: Abnormal   Collection Time: 07/05/17  5:32 AM  Result Value Ref Range   Sodium 136 135 - 145 mmol/L   Potassium 3.9 3.5 - 5.1 mmol/L   Chloride 100 (L) 101 - 111 mmol/L   CO2 26 22 - 32 mmol/L   Glucose, Bld 109 (H) 65 - 99 mg/dL   BUN 30 (H) 6 - 20 mg/dL   Creatinine, Ser 8.77 (H) 0.61 - 1.24 mg/dL   Calcium 8.5 (L) 8.9 - 10.3 mg/dL   GFR calc non Af Amer 5 (L) >60 mL/min   GFR calc Af Amer 6 (L) >60 mL/min  Comment: (NOTE) The eGFR has been calculated using the CKD EPI equation. This calculation has not been validated in all clinical situations. eGFR's persistently <60 mL/min signify possible Chronic Kidney Disease.    Anion gap 10 5 - 15  Glucose, capillary     Status: Abnormal   Collection Time: 07/05/17  7:59 AM  Result Value Ref Range    Glucose-Capillary 100 (H) 65 - 99 mg/dL   *Note: Due to a large number of results and/or encounters for the requested time period, some results have not been displayed. A complete set of results can be found in Results Review.    No results found.  _0 @  GI DISCHARGE PLANNING:  Diet:  Renal diet    GI Medications:  Would discharged home on Canasa suppository QHS 500 mg  Labs/Procedures Ordered:  GI FOLLOW UP:  Call [phone] to make appointment.   Doctor:  Schedule appointment with Dr. Arta Silence  Time:   Call office to schedule appointment in 2 weeks   Laurence Spates, MD   Pager [phone] If no answer or after hours call [phone]

## 2017-07-06 DIAGNOSIS — Z23 Encounter for immunization: Secondary | ICD-10-CM | POA: Diagnosis not present

## 2017-07-06 DIAGNOSIS — E119 Type 2 diabetes mellitus without complications: Secondary | ICD-10-CM | POA: Diagnosis not present

## 2017-07-06 DIAGNOSIS — N2581 Secondary hyperparathyroidism of renal origin: Secondary | ICD-10-CM | POA: Diagnosis not present

## 2017-07-06 DIAGNOSIS — D631 Anemia in chronic kidney disease: Secondary | ICD-10-CM | POA: Diagnosis not present

## 2017-07-06 DIAGNOSIS — N186 End stage renal disease: Secondary | ICD-10-CM | POA: Diagnosis not present

## 2017-07-06 DIAGNOSIS — D509 Iron deficiency anemia, unspecified: Secondary | ICD-10-CM | POA: Diagnosis not present

## 2017-07-08 ENCOUNTER — Encounter: Payer: Self-pay | Admitting: Pulmonary Disease

## 2017-07-08 ENCOUNTER — Ambulatory Visit (INDEPENDENT_AMBULATORY_CARE_PROVIDER_SITE_OTHER): Payer: Medicare Other | Admitting: Pulmonary Disease

## 2017-07-08 ENCOUNTER — Other Ambulatory Visit: Payer: Self-pay

## 2017-07-08 ENCOUNTER — Ambulatory Visit: Payer: Medicare Other | Admitting: Pulmonary Disease

## 2017-07-08 VITALS — BP 142/80 | HR 86 | Ht 73.0 in | Wt 225.2 lb

## 2017-07-08 DIAGNOSIS — I255 Ischemic cardiomyopathy: Secondary | ICD-10-CM | POA: Diagnosis not present

## 2017-07-08 DIAGNOSIS — C61 Malignant neoplasm of prostate: Secondary | ICD-10-CM | POA: Diagnosis not present

## 2017-07-08 DIAGNOSIS — G4733 Obstructive sleep apnea (adult) (pediatric): Secondary | ICD-10-CM | POA: Diagnosis not present

## 2017-07-08 NOTE — Patient Instructions (Signed)
Follow up in 1 year.

## 2017-07-08 NOTE — Progress Notes (Signed)
Current Outpatient Prescriptions on File Prior to Visit  Medication Sig  . acetaminophen (TYLENOL) 325 MG tablet Take 650 mg by mouth every 6 (six) hours as needed for mild pain or headache.   . allopurinol (ZYLOPRIM) 100 MG tablet Take 1 tablet (100 mg total) by mouth daily.  . citalopram (CELEXA) 10 MG tablet Take 10 mg by mouth daily as needed (anxiety).   . colesevelam (WELCHOL) 625 MG tablet TAKE THREE TABLETS BY MOUTH TWICE DAILY WITH MEALS (Patient taking differently: Take 1,875 mg by mouth 2 (two) times daily with a meal. )  . ezetimibe (ZETIA) 10 MG tablet TAKE ONE TABLET BY MOUTH ONCE DAILY (Patient taking differently: TAKE 10 MG BY MOUTH ONCE DAILY)  . gabapentin (NEURONTIN) 300 MG capsule Take 1 capsule (300 mg total) by mouth at bedtime.  [name] HYDROcodone-acetaminophen (NORCO/VICODIN) 5-325 MG tablet Take 1 tablet by mouth every 6 (six) hours as needed for moderate pain.  . hydroxypropyl methylcellulose / hypromellose (ISOPTO TEARS / GONIOVISC) 2.5 % ophthalmic solution Place 1 drop into both eyes 3 (three) times daily as needed for dry eyes.  . Lanthanum Carbonate 1000 MG PACK Take 3,000 mg by mouth 2 (two) times daily.   . mesalamine (CANASA) 1000 MG suppository Place 0.5 suppositories (500 mg total) rectally at bedtime.  . methimazole (TAPAZOLE) 5 MG tablet Take 1 tablet (5 mg total) by mouth daily.  . metoprolol tartrate (LOPRESSOR) 25 MG tablet Take 1 tablet (25 mg total) by mouth 2 (two) times daily. ON NON-HD DAYS  . midodrine (PROAMATINE) 10 MG tablet Take 1 tablet (10 mg total) by mouth See admin instructions. Once a day only on dialysis days (Tues/Thurs/Sat)  . multivitamin (RENA-VIT) TABS tablet Take 1 tablet by mouth daily. Reported on 10/24/2015  . pantoprazole (PROTONIX) 40 MG tablet Take 40 mg by mouth every evening.   . polyethylene glycol (MIRALAX / GLYCOLAX) packet Take 17 g by mouth daily. (Patient taking differently: Take 17 g by mouth daily as needed for mild  constipation. )  . SENSIPAR 90 MG tablet Take 90 mg by mouth 2 (two) times daily.   No current facility-administered medications on file prior to visit.      Chief Complaint  Patient presents with  . Follow-up    Pt doing well over all.     Sleep tests PSG 04/24/06 >> AHI 110.7, SpO2 low 81% Auto CPAP 02/14/17 to 03/15/17 >> used on 30 of 30 nights with average 9 hrs 49 min.  Average AHI 3.8 with median CPAP 12 and 95 th percentile CPAP 18 cm H2O  Pulmonary tests PFT 01/12/09 >> FEV1 2.53 (73%), FEV1% 81, TLC 4.99 (70%), DLCO 64%  Cardiac tests Echo 10/17/16 >> EF 40 to 45%, grade 2 DD  Past medical history Allergies, BPH, CAD, Depression, Dementia, DM, Diastolic CHF, HLD, ESRD, GERD, HTN, Prostate cancer, Peripheral neuropathy  Past surgical history, Family history, Social history, Allergies reviewed  Vital Signs BP (!) 142/80 (BP Location: Left Arm, Cuff Size: Normal)   Pulse 86   Ht 6\' 1"  (1.854 m)   Wt 225 lb 3.2 oz (102.2 kg)   SpO2 97%   BMI 29.71 kg/m   History of Present Illness [name] is a 66 y.o. male with obstructive sleep apnea.  Since I saw him a year ago he had a heart attack and needed to have stent placement.  He developed systolic CHF.  He is scheduled for evaluation with Dr. Trenton Gammon with neurosurgery for  neck and back pain.  He was found to have severe spinal stenosis in both areas.  He is very limited in his activity due to pain symptoms.  He has been feeling more depressed because of all these issues.  He was prescribed celexa, but hasn't been taking this consistently.  He is doing okay with CPAP.  No issues with mask fit.  Physical Exam  General - seems depressed Eyes - pupils reactive ENT - no sinus tenderness, no oral exudate, no LAN, MP 4 Cardiac - regular, no murmur Chest - no wheeze, rales Abd - soft, non tender Ext - no edema Skin - no rashes Neuro - normal strength Psych - flat affect  Assessment/Plan  Obstructive sleep apnea. -  he reports compliance with CPAP and benefit - continue auto CPAP  Depression. - advised him to f/u with his PCP - also advised that antidepressant medications should be taken on regular basis as prescribed to get maximal benefit from therapy  Cervical and Lumbar spinal stenosis. - explained that his sleep apnea would not preclude him from having surgery   Patient Instructions  Follow up in 1 year   [name] Mires, MD [place] Pager:  [phone] 07/08/2017, 2:03 PM

## 2017-07-09 ENCOUNTER — Other Ambulatory Visit: Payer: Self-pay

## 2017-07-09 DIAGNOSIS — D631 Anemia in chronic kidney disease: Secondary | ICD-10-CM | POA: Diagnosis not present

## 2017-07-09 DIAGNOSIS — E119 Type 2 diabetes mellitus without complications: Secondary | ICD-10-CM | POA: Diagnosis not present

## 2017-07-09 DIAGNOSIS — D509 Iron deficiency anemia, unspecified: Secondary | ICD-10-CM | POA: Diagnosis not present

## 2017-07-09 DIAGNOSIS — Z23 Encounter for immunization: Secondary | ICD-10-CM | POA: Diagnosis not present

## 2017-07-09 DIAGNOSIS — N2581 Secondary hyperparathyroidism of renal origin: Secondary | ICD-10-CM | POA: Diagnosis not present

## 2017-07-09 DIAGNOSIS — N186 End stage renal disease: Secondary | ICD-10-CM | POA: Diagnosis not present

## 2017-07-09 NOTE — Patient Outreach (Signed)
[facility]) Care Management  07/08/2017  [name] 1951/04/22 [id number]  Patient recently hospitalized for lower GI bleed 10/1-10/01/2017.  Attempted to reach patient without success. Left HIPAA compliant voicemail with RNCM contact information and invited patient to return call.  [place] R. Tallen Schnorr, RN, BSN, [place] Management Coordinator [phone]

## 2017-07-09 NOTE — Discharge Summary (Addendum)
Physician Discharge Summary  [name] IEP:[id number] DOB: 11-Jun-1951 DOA: 07/01/2017  PCP: Biagio Borg, MD  Admit date: 07/01/2017 Discharge date: 07/05/2017  Time spent: 35 minutes  Recommendations for Outpatient Follow-up:  1. PCP Dr.John in 1 week   Discharge Diagnoses:  Principal Problem:   Rectal bleeding   Radiation proctopathy and ulcer   Anemia associated with acute blood loss   DM (diabetes mellitus) type II controlled with renal manifestation ([place])   ESRD on hemodialysis [facility])   Discharge Condition: stable  Diet recommendation: Renal diet  Filed Weights   07/02/17 1129 07/04/17 0755 07/04/17 1214  Weight: 103 kg (227 lb 1.2 oz) 102.2 kg (225 lb 5 oz) 100.7 kg (222 lb 0.1 oz)    History of present illness:  [name] is an 66 y.o. male with a PMH of type 2 diabetes, ESRD on HD (T/T/S), hypertension, hyperlipidemia and history of prostate cancer who was admitted 07/01/17 for evaluation of acute rectal bleeding  Hospital Course:   Rectal bleeding/radiation proctopathy and ulcer -resolved now, GI consulted seen by [facility], status post flexible sigmoidoscopy 10/2 -transfuse 2 unit PRBC this admission -recommended Canasa suppositories which were continued -Hb stable at discharge, expect intermittent bleeding from this, as it will take a long time to heal since radiation induced ulcer.  Acute blood loss anemia/Hypotension -status post 2 units PRBC transfusion, hemoglobin since stable    DM (diabetes mellitus) type II controlled with renal manifestation ([name])/neuropathy -not on meds at home, stable    ESRD on hemodialysis ([name])/secondary hyperparathyroidism/metabolic bone disease -Followed by Renal this admission, s/p inpatient HD  Consultations:  [facility]  Discharge Exam: Vitals:   07/04/17 2130 07/05/17 0513  BP: (!) 148/72 (!) 116/59  Pulse: 73 79  Resp: 17 17  Temp: 98.2 F (36.8 C) 98.5 F (36.9 C)  SpO2: 96% 96%    General:  AAOx3 Cardiovascular: S1S2/RRR Respiratory: CTAB  Discharge Instructions   Discharge Instructions    Discharge instructions    Complete by:  As directed    Renal Diet   Increase activity slowly    Complete by:  As directed      Discharge Medication List as of 07/05/2017  4:07 PM    CONTINUE these medications which have CHANGED   Details  gabapentin (NEURONTIN) 300 MG capsule Take 1 capsule (300 mg total) by mouth at bedtime., Starting Fri 07/05/2017, No Print    mesalamine (CANASA) 1000 MG suppository Place 0.5 suppositories (500 mg total) rectally at bedtime., Starting Fri 07/05/2017, Normal      CONTINUE these medications which have NOT CHANGED   Details  acetaminophen (TYLENOL) 325 MG tablet Take 650 mg by mouth every 6 (six) hours as needed for mild pain or headache. , Historical Med    allopurinol (ZYLOPRIM) 100 MG tablet Take 1 tablet (100 mg total) by mouth daily., Starting Fri 07/20/2016, Normal    citalopram (CELEXA) 10 MG tablet Take 10 mg by mouth daily as needed (anxiety). , Historical Med    colesevelam (WELCHOL) 625 MG tablet TAKE THREE TABLETS BY MOUTH TWICE DAILY WITH MEALS, Normal    ezetimibe (ZETIA) 10 MG tablet TAKE ONE TABLET BY MOUTH ONCE DAILY, Normal    HYDROcodone-acetaminophen (NORCO/VICODIN) 5-325 MG tablet Take 1 tablet by mouth every 6 (six) hours as needed for moderate pain., Historical Med    hydroxypropyl methylcellulose / hypromellose (ISOPTO TEARS / GONIOVISC) 2.5 % ophthalmic solution Place 1 drop into both eyes 3 (three) times  daily as needed for dry eyes., Starting Fri 05/03/2017, Normal    Lanthanum Carbonate 1000 MG PACK Take 3,000 mg by mouth 2 (two) times daily. , Historical Med    methimazole (TAPAZOLE) 5 MG tablet Take 1 tablet (5 mg total) by mouth daily., Starting Wed 10/24/2016, Normal    metoprolol tartrate (LOPRESSOR) 25 MG tablet Take 1 tablet (25 mg total) by mouth 2 (two) times daily. ON NON-HD DAYS, Starting Wed 12/26/2016,  Normal    midodrine (PROAMATINE) 10 MG tablet Take 1 tablet (10 mg total) by mouth See admin instructions. Once a day only on dialysis days (Tues/Thurs/Sat), Starting Thu 10/18/2016, Print    multivitamin (RENA-VIT) TABS tablet Take 1 tablet by mouth daily. Reported on 10/24/2015, Historical Med    pantoprazole (PROTONIX) 40 MG tablet Take 40 mg by mouth every evening. , Starting Fri 08/31/2016, Historical Med    polyethylene glycol (MIRALAX / GLYCOLAX) packet Take 17 g by mouth daily., Starting Thu 05/23/2017, Normal    SENSIPAR 90 MG tablet Take 90 mg by mouth 2 (two) times daily., Starting Tue 09/18/2016, Historical Med      STOP taking these medications     oxyCODONE (OXY IR/ROXICODONE) 5 MG immediate release tablet        Allergies  Allergen Reactions  . Cephalexin Swelling and Other (See Comments)    Tongue swelling  . Statins Other (See Comments)    Weak muscles  . Ciprofloxacin Rash      The results of significant diagnostics from this hospitalization (including imaging, microbiology, ancillary and laboratory) are listed below for reference.    Significant Diagnostic Studies: Mr Cervical Spine Wo Contrast  Result Date: 06/29/2017  [facility] [address] [place], [place] [address] [phone] NEUROIMAGING REPORT STUDY DATE: 06/28/2017 PATIENT NAME: [name] DOB: January 10, 1951 MRN: [id number] EXAM: MRI of the cervical spine ORDERING CLINICIAN: Kathrynn Ducking M.D. CLINICAL HISTORY: 66 year old man with cervical radiculopathy COMPARISON FILMS: MRI 03/11/2008 TECHNIQUE: MRI of the cervical spine was obtained utilizing 3 mm sagittal slices from the posterior fossa down to the T3-4 level with T1, T2 and inversion recovery views. In addition 4 mm axial slices from B7-0 down to T1-2 level were included with T2 and gradient echo views. CONTRAST: None IMAGING SITE: [facility], [place], [place], [place] FINDINGS: :  On sagittal images, the  spine is imaged from above the cervicomedullary junction to T3.   The spinal cord is of normal  signal.  There is ACDF from C3-C6. There is kyphosis at C2C 3..  Endplate degenerative changes are noted at C2C 3..  The discs and interspaces were further evaluated on axial views from C2 to T1 as follows: C2-C3: There is moderately severe spinal stenosis open(AP diameter 6.3 mm) due to right greater than left uncovertebral spurring, facet hypertrophy and disc protrusion. There is distortion of the thecal sac with flattening of the spinal cord, more the right. There is severe right and mild left foraminal narrowing that could lead to right C3 nerve root compression. C3-C4: This level was fused. There is uncovertebral spurring to the right causing moderate right foraminal narrowing.  The central canal is narrowed but not enough to be considered spinal stenosis. C4-C5: This level has been fused. There is mild right uncovertebral spurring and mild right foraminal narrowing but no nerve root compression. C5-C6: This level has been fused. Mild facet hypertrophy is noted. The neural foramina are not significantly narrowed and there is no nerve  root compression. C6-C7: There is mild spinal stenosis due to a right paramedian disc protrusion.  The thecal sac is distorted and there is mild flattening of the right side of the cord. C7-T1: There is facet hypertrophy and minimal disc bulging. There is mild foraminal narrowing but no nerve root compression. Compared to the MRI dated 03/11/2008, the spinal stenosis at C2-C3 has progressed from mild to moderate.  Other levels are similar.    This MRI of the cervical spine without contrast shows the following: 1.    ACDF at C3 - C6 2.    Moderately severe spinal stenosis at C2C3 associated with severe right foraminal narrowing that could lead to right C3 nerve root compression. The spinal and foraminal stenosis at this level have progressed with compared to the 2009 MRI. 3.    At C6-C7  there is mild spinal stenosis due to a right paramedian disc protrusion. There is no nerve root compression. INTERPRETING PHYSICIAN: Richard A. Felecia Shelling, MD, PhD, FAAN Certified in  Neuroimaging by [employer]   Mr Lumbar Spine Wo Contrast  Result Date: 06/29/2017  [facility] [address], [place], [place] [address] [phone] NEUROIMAGING REPORT STUDY DATE: 06/28/2017 PATIENT NAME: [name] DOB: 1950/12/14 MRN: [id number] EXAM: MRI of the lumbar spine without contrast ORDERING CLINICIAN: Kathrynn Ducking M.D. CLINICAL HISTORY: 66 year old man with right leg pain COMPARISON FILMS: MRI 02/16/2009 TECHNIQUE: MRI of the lumbar spine was obtained utilizing 4 mm sagittal slices from I14-43 down to the lower sacrum with T1, T2 and inversion recovery views. In addition 4 mm axial slices from X5-4 down to L5-S1 level were included with T1 and T2 weighted views. CONTRAST: None IMAGING SITE: [facility], [address] [place], [place], [place] FINDINGS: On sagittal images, the spine is imaged from T11 to the sacrum.   The conus medullaris and cauda equine appear normal.   The vertebral bodies are normally aligned. L4 and L5 are fused. Pedicle screws are noted.  There is an interbody spacer at L3-L4 from previous fusion surgery.   There is severe loss of disc height at L1-L2, L2-L3 and moderate loss of disc height at L3-L4 and L5-S1. Motor type II endplate degenerative changes are noted at L1-L2, L2-L3 and L3-L4.  The discs and interspaces were further evaluated on axial views from L1 to S1 as follows: T12-L1: There is mild spinal stenosis due to broad disc protrusion and facet hypertrophy. There is moderate foraminal and mild lateral recess narrowing. There is no definite nerve root compression. These degenerative changes have progressed when compared to the 2010 MRI. L1-L2:   There is mild spinal stenosis due to disc protrusion, facet hypertrophy and endplate spurring.  There is moderately severe foraminal narrowing bilaterally, mild left lateral recess stenosis and moderate right lateral recess stenosis there is potential for compression of the exiting L1 nerve roots and the right L2 nerve root.   The degenerative changes have progressed compared to the 2010 MRI. L2-L3: There is severe spinal stenosis due to disc herniation, facet hypertrophy and ligamentum flavum hypertrophy. There is moderate foraminal narrowing and severe lateral recess stenosis, right greater than left with probable bilateral L3 nerve root compression. The degenerative changes have progressed when compared to the 2010 MRI. L3-L4: There is moderate spinal stenosis associated with moderate right, mild left foraminal narrowing and bilateral mild-to-moderate lateral recess stenosis. There is no definite nerve root compression. L4-L5: This level has been fused and there is no spinal stenosis.  There does not appear to be foraminal narrowing or lateral recess stenosis. L5-S1: There is disc protrusion and facet hypertrophy. There appears to be prior left hemilaminectomy. There is moderate bilateral foraminal narrowing and moderate left and mild right lateral recess stenosis.   There is possible left S1 nerve root compression.  Compared to the previous MRI, there is some progression of the right facet hypertrophy.    This MRI of the lumbar spine shows severe multilevel degenerative changes as detailed above. There has been previous fusion at L3-L4 and L4-L5.    There is potential for compression of the L1 nerve roots and the right L2 nerve root at L1-L2. There is also probable compression of the L3 nerve roots at L2-L3 and possible compression of the left S1 nerve root at L5-S1. There is severe spinal stenosis at L2-L3 and moderate spinal stenosis at L3-L4 and mild spinal stenosis at L1-L2. All 3 of these levels have progressed when compared to the previous MRI. There is also progression of the facet hypertrophy at  L5-S1. INTERPRETING PHYSICIAN: Richard A. Felecia Shelling, MD, PhD, FAAN Certified in  [place] by [employer] Neuroimaging    Microbiology: Recent Results (from the past 240 hour(s))  MRSA PCR Screening     Status: None   Collection Time: 07/03/17  8:37 AM  Result Value Ref Range Status   MRSA by PCR NEGATIVE NEGATIVE Final    Comment:        The GeneXpert MRSA Assay (FDA approved for NASAL specimens only), is one component of a comprehensive MRSA colonization surveillance program. It is not intended to diagnose MRSA infection nor to guide or monitor treatment for MRSA infections.      Labs: Basic Metabolic Panel:  Recent Labs Lab 07/04/17 0647 07/05/17 0532  NA 138 136  K 3.9 3.9  CL 99* 100*  CO2 26 26  GLUCOSE 139* 109*  BUN 38* 30*  CREATININE 10.44* 8.77*  CALCIUM 8.4* 8.5*  PHOS 6.7*  --    Liver Function Tests:  Recent Labs Lab 07/04/17 0647  ALBUMIN 3.0*   No results for input(s): LIPASE, AMYLASE in the last 168 hours. No results for input(s): AMMONIA in the last 168 hours. CBC:  Recent Labs Lab 07/02/17 2135 07/03/17 0356 07/03/17 1213 07/04/17 0647 07/05/17 0532  WBC  --   --   --  6.4 6.9  HGB 9.8* 9.0* 8.1* 7.5* 9.0*  HCT 30.3* 28.5* 26.4* 24.5* 29.1*  MCV  --   --   --  90.4 90.7  PLT  --   --   --  243 253   Cardiac Enzymes: No results for input(s): CKTOTAL, CKMB, CKMBINDEX, TROPONINI in the last 168 hours. BNP: BNP (last 3 results) No results for input(s): BNP in the last 8760 hours.  ProBNP (last 3 results) No results for input(s): PROBNP in the last 8760 hours.  CBG:  Recent Labs Lab 07/04/17 1234 07/04/17 1701 07/04/17 2131 07/05/17 0759 07/05/17 1230  GLUCAP 100* 114* 105* 100* 129*       Signed:  Hadi Dubin MD.  [facility] 07/09/2017, 5:24 PM

## 2017-07-09 NOTE — Patient Outreach (Signed)
[name] Care Management  07/09/17  [name] 09/15/1951 [id number]  Successful outreach completed with patient's wife, [name]. Patient identification verified.  Patient is currently at dialysis so completed initial TOC call with his wife.  Patient's wife stated that he is now how and "not doing that great" since his discharge home. She reported that he is now having problems with his back. She stated that she took him to his neurologist (Dr. Jannifer Franklin) and he did a MRI of his neck and back and referred him to a neurosurgeon (Dr. Trenton Gammon). She stated that he has an appointment next week to see him and they will find out if he needs surgery or not. She stated that [name] feels that he will need surgery because "his back is so messed up" per his wife, but they are not sure what the plan is yet.   She was able to state his reason for his hospitalization. She stated that he started to bleed again, but was able to stop his bleeding while in the hospital. However, she is very concerned that it will start again, because "they don't really know what caused it." She stated she knows it is related to his radiation treatments in the past. She reported that he was given a new suppository this time (mesalamine), but they do not have it yet. She stated that the pharmacy did not have any in stock and have ordered it. They are awaiting call from pharmacy when it arrives. She stated he was given suppositories in the past, but when they went to pick them up, they cost $400 and they could not afford them. She stated that if these suppositories are expensive, they will not be able to pay for them. RNCM advised of [facility] pharmacist on staff and have them review if there is any assistance program for this medication and patient's wife was agreeable.   She stated that he had a follow up appointment with Dr. Halford Chessman yesterday and that appointment went "pretty well," but stated that Dr. Halford Chessman noticed  that patient was not his usual self. He told them that from his perspective with the CPAP and sleep study, he was fine, but he could tell that patient is depressed. She stated that she told Dr. Halford Chessman that patient has had a lot going on lately and every time he starts to recover from one thing, something else happened. She stated that Dr. Halford Chessman instructed patient to take his celexa as prescribed and not to take a pill here or there as he has been doing.   She reported that patient has been in a lot of pain since his discharge home and that physical therapy was not ordered when he was discharged home. She stated that Joellen Jersey, his PT had called and is the home health nurse is trying to get his therapy started again, but she is not sure that he can even participate at this point due to his pain. She stated that she told Katie about the appointment with the neurosurgeon and RNCM encouraged her to discuss with him at their visit if PT is appropriate at this time due to his back pain and/or when it would be appropriate to resume PT (depending on the treatment options/recommendations provided by Dr. Trenton Gammon).  Current Outpatient Prescriptions on File Prior to Visit  Medication Sig Dispense Refill  . acetaminophen (TYLENOL) 325 MG tablet Take 650 mg by mouth every 6 (six) hours as needed for mild pain or headache.     [name]  allopurinol (ZYLOPRIM) 100 MG tablet Take 1 tablet (100 mg total) by mouth daily. 90 tablet 3  . citalopram (CELEXA) 10 MG tablet Take 10 mg by mouth daily as needed (anxiety).     . colesevelam (WELCHOL) 625 MG tablet TAKE THREE TABLETS BY MOUTH TWICE DAILY WITH MEALS (Patient taking differently: Take 1,875 mg by mouth 2 (two) times daily with a meal. ) 180 tablet 11  . ezetimibe (ZETIA) 10 MG tablet TAKE ONE TABLET BY MOUTH ONCE DAILY (Patient taking differently: TAKE 10 MG BY MOUTH ONCE DAILY) 90 tablet 3  . gabapentin (NEURONTIN) 300 MG capsule Take 1 capsule (300 mg total) by mouth at bedtime.    [name]  HYDROcodone-acetaminophen (NORCO/VICODIN) 5-325 MG tablet Take 1 tablet by mouth every 6 (six) hours as needed for moderate pain.    . hydroxypropyl methylcellulose / hypromellose (ISOPTO TEARS / GONIOVISC) 2.5 % ophthalmic solution Place 1 drop into both eyes 3 (three) times daily as needed for dry eyes. 15 mL 11  . Lanthanum Carbonate 1000 MG PACK Take 3,000 mg by mouth 2 (two) times daily.     . mesalamine (CANASA) 1000 MG suppository Place 0.5 suppositories (500 mg total) rectally at bedtime. 30 suppository 2  . methimazole (TAPAZOLE) 5 MG tablet Take 1 tablet (5 mg total) by mouth daily. 30 tablet 11  . metoprolol tartrate (LOPRESSOR) 25 MG tablet Take 1 tablet (25 mg total) by mouth 2 (two) times daily. ON NON-HD DAYS 144 tablet 3  . midodrine (PROAMATINE) 10 MG tablet Take 1 tablet (10 mg total) by mouth See admin instructions. Once a day only on dialysis days (Tues/Thurs/Sat) 30 tablet 0  . multivitamin (RENA-VIT) TABS tablet Take 1 tablet by mouth daily. Reported on 10/24/2015    . pantoprazole (PROTONIX) 40 MG tablet Take 40 mg by mouth every evening.     . polyethylene glycol (MIRALAX / GLYCOLAX) packet Take 17 g by mouth daily. (Patient taking differently: Take 17 g by mouth daily as needed for mild constipation. ) 14 each 0  . SENSIPAR 90 MG tablet Take 90 mg by mouth 2 (two) times daily.     No current facility-administered medications on file prior to visit.     Patient's wife currently does not have any additional questions or concerns.   Fall Risk  04/17/2017 02/18/2017 02/07/2017 10/04/2016 06/25/2016  Falls in the past year? Yes Yes Yes - Yes  Comment No new falls since 02/07/2017 per patient - - - -  Number falls in past yr: 2 or more 2 or more 2 or more - 1  Injury with Fall? Yes No Yes - No  Risk Factor Category  High Fall Risk - High Fall Risk - -  Risk for fall due to : History of fall(s);Impaired balance/gait;Impaired mobility - History of fall(s);Impaired  balance/gait;Impaired mobility History of fall(s);Impaired balance/gait;Impaired mobility -  Follow up Falls prevention discussed;Falls evaluation completed;Education provided - Falls evaluation completed;Education provided;Falls prevention discussed - -  **No new falls since 01/2017   Depression screen [facility] 2/9 02/07/2017 02/01/2017 10/15/2016 12/29/2015 10/24/2015  Decreased Interest 3 0 1 0 0  Down, Depressed, Hopeless 3 1 1  0 0  PHQ - 2 Score 6 1 2  0 0  Altered sleeping 1 - 1 - -  Tired, decreased energy 3 - 1 - -  Change in appetite 1 - 1 - -  Feeling bad or failure about yourself  0 - 0 - -  Trouble concentrating 0 - 1 - -  Moving slowly or fidgety/restless 0 - 0 - -  Suicidal thoughts 0 - 0 - -  PHQ-9 Score 11 - 6 - -  Difficult doing work/chores Extremely dIfficult Somewhat difficult Somewhat difficult - -  Some recent data might be hidden  **Unable to assess depression screening today 07/09/2017 due to patient being gone to dialysis during call.  Plan: RNCM to make referral to [facility] pharmacist. Will make outreach to patient via call next week for continued transition of care follow up.  [facility] CM Care Plan Problem One     Most Recent Value  Care Plan Problem One  Patient at risk for hospitalization related to GI bleeding as evidenced by 2 admissions in past 2 months  Role Documenting the Problem One  Care Management [place] for Problem One  Active  [facility] Long Term Goal   Patient will not have a hospital admission related to GI bleeding within the next 31 days  [facility] Long Term Goal Start Date  07/09/17  Interventions for Problem One Long Term Goal  RNCM educated patient/wife on importance of attending all follow up appointments and taking all medications as prescribed.   [facility] CM Short Term Goal #1   Patient will engage with [facility] pharmacist within next 2 weeks for assistance with obtaining new prescription suppository (mesalamine)  [facility] CM Short Term Goal #1 Start Date  07/09/17   Interventions for Short Term Goal #1  RNCM to make referral to [facility] pharmacist  [facility] CM Short Term Goal #2   Patient will take all medications as prescribed for the next 30 days  [facility] CM Short Term Goal #2 Start Date  07/09/17  Interventions for Short Term Goal #2  RNCM educated patient on importance of taking his medications as prescribed. RNCM to monitor adherence to antidepressant and will share information with PCP and [facility] pharmacist once engaged  [facility] CM Short Term Goal #3  Patient will verbalize signs and symptoms to monitor for related to GI bleeding within the next 30 days  [facility] CM Short Term Goal #3 Start Date  07/09/17  Interventions for Short [place] Goal #3  RNCM to provide education about GI bleeding, signs and symptoms to report and when to call the doctor     [place] R. Roselie Cirigliano, RN, BSN, [place] Management Coordinator [phone]

## 2017-07-11 DIAGNOSIS — E119 Type 2 diabetes mellitus without complications: Secondary | ICD-10-CM | POA: Diagnosis not present

## 2017-07-11 DIAGNOSIS — D509 Iron deficiency anemia, unspecified: Secondary | ICD-10-CM | POA: Diagnosis not present

## 2017-07-11 DIAGNOSIS — Z23 Encounter for immunization: Secondary | ICD-10-CM | POA: Diagnosis not present

## 2017-07-11 DIAGNOSIS — D631 Anemia in chronic kidney disease: Secondary | ICD-10-CM | POA: Diagnosis not present

## 2017-07-11 DIAGNOSIS — N2581 Secondary hyperparathyroidism of renal origin: Secondary | ICD-10-CM | POA: Diagnosis not present

## 2017-07-11 DIAGNOSIS — N186 End stage renal disease: Secondary | ICD-10-CM | POA: Diagnosis not present

## 2017-07-12 DIAGNOSIS — C61 Malignant neoplasm of prostate: Secondary | ICD-10-CM | POA: Diagnosis not present

## 2017-07-13 DIAGNOSIS — Z23 Encounter for immunization: Secondary | ICD-10-CM | POA: Diagnosis not present

## 2017-07-13 DIAGNOSIS — D631 Anemia in chronic kidney disease: Secondary | ICD-10-CM | POA: Diagnosis not present

## 2017-07-13 DIAGNOSIS — E119 Type 2 diabetes mellitus without complications: Secondary | ICD-10-CM | POA: Diagnosis not present

## 2017-07-13 DIAGNOSIS — D509 Iron deficiency anemia, unspecified: Secondary | ICD-10-CM | POA: Diagnosis not present

## 2017-07-13 DIAGNOSIS — N186 End stage renal disease: Secondary | ICD-10-CM | POA: Diagnosis not present

## 2017-07-13 DIAGNOSIS — N2581 Secondary hyperparathyroidism of renal origin: Secondary | ICD-10-CM | POA: Diagnosis not present

## 2017-07-15 ENCOUNTER — Other Ambulatory Visit: Payer: Self-pay

## 2017-07-15 NOTE — Patient Outreach (Signed)
[facility]) Care Management  07/15/17  [name] [birth year] [id number]  Attempted to reach patient without success. Unable to leave a voicemail due to line being busy. RNCM will attempt to reach patient again this week for transition of care follow up.  [name], RN, BSN, [place] Management Coordinator [phone]

## 2017-07-16 DIAGNOSIS — D631 Anemia in chronic kidney disease: Secondary | ICD-10-CM | POA: Diagnosis not present

## 2017-07-16 DIAGNOSIS — D509 Iron deficiency anemia, unspecified: Secondary | ICD-10-CM | POA: Diagnosis not present

## 2017-07-16 DIAGNOSIS — N2581 Secondary hyperparathyroidism of renal origin: Secondary | ICD-10-CM | POA: Diagnosis not present

## 2017-07-16 DIAGNOSIS — Z23 Encounter for immunization: Secondary | ICD-10-CM | POA: Diagnosis not present

## 2017-07-16 DIAGNOSIS — E119 Type 2 diabetes mellitus without complications: Secondary | ICD-10-CM | POA: Diagnosis not present

## 2017-07-16 DIAGNOSIS — N186 End stage renal disease: Secondary | ICD-10-CM | POA: Diagnosis not present

## 2017-07-16 NOTE — Op Note (Signed)
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 Patient had initial treatment with APC                            7/18 followed by repeat treatment with RFA 8/18.                            He's continued to have rectal bleeding. Providers:                Joyice Faster. Vasti Yagi Dr., MD, Angus Seller, Cletis [place], Technician Referring MD:              Medicines:                Monitored Anesthesia Care Complications:            No immediate complications. Estimated Blood Loss:     Estimated blood loss: none. Procedure:                Pre-Anesthesia Assessment:                           - Prior to the procedure, a History and Physical                            was performed, and patient medications and                            allergies were reviewed. The patient's tolerance of                            previous anesthesia was also reviewed. The risks                            and benefits of the procedure and the sedation                            options and risks were discussed with the patient.                            All questions were answered, and informed consent                            was obtained. Prior Anticoagulants: The patient has  taken no previous anticoagulant or antiplatelet                            agents. ASA Grade Assessment: III - A patient with                            severe systemic disease. After reviewing the risks                            and benefits, the patient was deemed in                            satisfactory condition to undergo the procedure.  After obtaining informed consent, the scope was                            passed under direct vision. The EC-[id number] ([id number])                            scope was introduced through the anus and advanced                            to the the sigmoid colon. The flexible                            sigmoidoscopy was accomplished without difficulty.                            The patient tolerated the procedure well. The                            quality of the bowel preparation was poor. Scope In: 2:39:43 PM Scope Out: 4:43:15 PM Total Procedure Duration: 0 hours 7 minutes 11 seconds  Findings:      The perianal and digital rectal examinations were normal.      A large amount of stool was found in the rectum, in the recto-sigmoid       colon and in the sigmoid colon, making visualization difficult. the       patient had not been prep with enemas but had been prepped only with       Miralax      Red blood was found in the rectum. we were able to advance to       approximately 30 cm in the sigmoid colon with irrigation. A large amount       stool was seen their but there was no bright red blood      A single (solitary) fifteen mm ulcer was found in the rectum. No       bleeding was present. Stigmata of recent bleeding were present.       Estimated blood loss: none. this was over the prostate and there was       coffee ground material and some apparent bright blood in the rectum. Impression:               - Preparation of the colon was poor.                           -  Stool in the rectum, in the recto-sigmoid colon                            and in the sigmoid colon.                           - Blood in the rectum.                           - A single (solitary) ulcer in the rectum. this is                            due to previous treatment of radiation proctitis                            and hopefully will improve. This is likely the                            source of the bleeding  since it was no obvious                            bright blood above the rectum.                           - No specimens collected. Recommendation:           - Use Canasa 1000 mg suppository 1 per rectum QHS.                           - Return patient to hospital ward for ongoing care.                           - Resume previous diet. Procedure Code(s):        --- Professional ---                           [phone], Sigmoidoscopy, flexible; diagnostic,                            including collection of specimen(s) by brushing or                            washing, when performed (separate procedure) Diagnosis Code(s):        --- Professional ---                           K62.6, Ulcer of anus and rectum                           K92.1, Melena (includes Hematochezia)                           K62.5, Hemorrhage of anus and rectum CPT copyright 2016 American Medical Association. All rights reserved. The codes documented in this report are preliminary and upon coder review may  be revised  to meet current compliance requirements. Nancy Fetter Dr., MD 07/16/2017 2:36:40 PM This report has been signed electronically. Number of Addenda: 0

## 2017-07-17 ENCOUNTER — Other Ambulatory Visit: Payer: Self-pay

## 2017-07-17 ENCOUNTER — Other Ambulatory Visit: Payer: Self-pay | Admitting: Neurosurgery

## 2017-07-17 DIAGNOSIS — M48062 Spinal stenosis, lumbar region with neurogenic claudication: Secondary | ICD-10-CM | POA: Diagnosis not present

## 2017-07-17 NOTE — Patient Outreach (Signed)
[name] [facility]) Care Management  07/17/17  [name] [birth year] [id number]  Second attempt to reach patient without success. Patient's son answered the phone and stated that he was not home right now and he is at a doctor's appointment. RNCM left HIPAA compliant message and stated that RNCM would attempt to reach patient again later this week.  [place] R. Kelly Ranieri, RN, BSN, [place] Management Coordinator [phone]

## 2017-07-18 DIAGNOSIS — D631 Anemia in chronic kidney disease: Secondary | ICD-10-CM | POA: Diagnosis not present

## 2017-07-18 DIAGNOSIS — Z23 Encounter for immunization: Secondary | ICD-10-CM | POA: Diagnosis not present

## 2017-07-18 DIAGNOSIS — N186 End stage renal disease: Secondary | ICD-10-CM | POA: Diagnosis not present

## 2017-07-18 DIAGNOSIS — N2581 Secondary hyperparathyroidism of renal origin: Secondary | ICD-10-CM | POA: Diagnosis not present

## 2017-07-18 DIAGNOSIS — D509 Iron deficiency anemia, unspecified: Secondary | ICD-10-CM | POA: Diagnosis not present

## 2017-07-18 DIAGNOSIS — E119 Type 2 diabetes mellitus without complications: Secondary | ICD-10-CM | POA: Diagnosis not present

## 2017-07-19 ENCOUNTER — Ambulatory Visit: Payer: Self-pay

## 2017-07-19 ENCOUNTER — Encounter (HOSPITAL_COMMUNITY): Payer: Medicare Other | Admitting: Internal Medicine

## 2017-07-20 DIAGNOSIS — D631 Anemia in chronic kidney disease: Secondary | ICD-10-CM | POA: Diagnosis not present

## 2017-07-20 DIAGNOSIS — N186 End stage renal disease: Secondary | ICD-10-CM | POA: Diagnosis not present

## 2017-07-20 DIAGNOSIS — Z23 Encounter for immunization: Secondary | ICD-10-CM | POA: Diagnosis not present

## 2017-07-20 DIAGNOSIS — N2581 Secondary hyperparathyroidism of renal origin: Secondary | ICD-10-CM | POA: Diagnosis not present

## 2017-07-20 DIAGNOSIS — D509 Iron deficiency anemia, unspecified: Secondary | ICD-10-CM | POA: Diagnosis not present

## 2017-07-20 DIAGNOSIS — E119 Type 2 diabetes mellitus without complications: Secondary | ICD-10-CM | POA: Diagnosis not present

## 2017-07-22 ENCOUNTER — Ambulatory Visit (HOSPITAL_COMMUNITY)
Admission: RE | Admit: 2017-07-22 | Discharge: 2017-07-22 | Disposition: A | Discharge status: Home / Self Care | Payer: Medicare Other | Source: Ambulatory Visit | Attending: Internal Medicine | Admitting: Internal Medicine

## 2017-07-22 ENCOUNTER — Encounter (HOSPITAL_COMMUNITY): Payer: Self-pay | Admitting: Internal Medicine

## 2017-07-22 VITALS — BP 182/92 | HR 82 | Wt 221.0 lb

## 2017-07-22 DIAGNOSIS — I252 Old myocardial infarction: Secondary | ICD-10-CM | POA: Insufficient documentation

## 2017-07-22 DIAGNOSIS — I471 Supraventricular tachycardia: Secondary | ICD-10-CM | POA: Diagnosis not present

## 2017-07-22 DIAGNOSIS — I5042 Chronic combined systolic (congestive) and diastolic (congestive) heart failure: Secondary | ICD-10-CM | POA: Insufficient documentation

## 2017-07-22 DIAGNOSIS — N186 End stage renal disease: Secondary | ICD-10-CM | POA: Diagnosis not present

## 2017-07-22 DIAGNOSIS — I2510 Atherosclerotic heart disease of native coronary artery without angina pectoris: Secondary | ICD-10-CM | POA: Diagnosis not present

## 2017-07-22 DIAGNOSIS — I255 Ischemic cardiomyopathy: Secondary | ICD-10-CM | POA: Insufficient documentation

## 2017-07-22 DIAGNOSIS — I214 Non-ST elevation (NSTEMI) myocardial infarction: Secondary | ICD-10-CM | POA: Diagnosis not present

## 2017-07-22 DIAGNOSIS — E785 Hyperlipidemia, unspecified: Secondary | ICD-10-CM | POA: Diagnosis not present

## 2017-07-22 DIAGNOSIS — G4733 Obstructive sleep apnea (adult) (pediatric): Secondary | ICD-10-CM | POA: Diagnosis not present

## 2017-07-22 DIAGNOSIS — E1122 Type 2 diabetes mellitus with diabetic chronic kidney disease: Secondary | ICD-10-CM | POA: Diagnosis not present

## 2017-07-22 DIAGNOSIS — Z8546 Personal history of malignant neoplasm of prostate: Secondary | ICD-10-CM | POA: Insufficient documentation

## 2017-07-22 DIAGNOSIS — F329 Major depressive disorder, single episode, unspecified: Secondary | ICD-10-CM | POA: Insufficient documentation

## 2017-07-22 DIAGNOSIS — M109 Gout, unspecified: Secondary | ICD-10-CM | POA: Diagnosis not present

## 2017-07-22 DIAGNOSIS — F0390 Unspecified dementia without behavioral disturbance: Secondary | ICD-10-CM | POA: Diagnosis not present

## 2017-07-22 DIAGNOSIS — E1140 Type 2 diabetes mellitus with diabetic neuropathy, unspecified: Secondary | ICD-10-CM | POA: Insufficient documentation

## 2017-07-22 DIAGNOSIS — E0590 Thyrotoxicosis, unspecified without thyrotoxic crisis or storm: Secondary | ICD-10-CM | POA: Diagnosis not present

## 2017-07-22 DIAGNOSIS — K219 Gastro-esophageal reflux disease without esophagitis: Secondary | ICD-10-CM | POA: Insufficient documentation

## 2017-07-22 DIAGNOSIS — N400 Enlarged prostate without lower urinary tract symptoms: Secondary | ICD-10-CM | POA: Insufficient documentation

## 2017-07-22 DIAGNOSIS — Z992 Dependence on renal dialysis: Secondary | ICD-10-CM

## 2017-07-22 DIAGNOSIS — Z79899 Other long term (current) drug therapy: Secondary | ICD-10-CM | POA: Insufficient documentation

## 2017-07-22 DIAGNOSIS — Z7982 Long term (current) use of aspirin: Secondary | ICD-10-CM | POA: Insufficient documentation

## 2017-07-22 DIAGNOSIS — M5412 Radiculopathy, cervical region: Secondary | ICD-10-CM | POA: Diagnosis not present

## 2017-07-22 DIAGNOSIS — I5022 Chronic systolic (congestive) heart failure: Secondary | ICD-10-CM

## 2017-07-22 DIAGNOSIS — I10 Essential (primary) hypertension: Secondary | ICD-10-CM

## 2017-07-22 DIAGNOSIS — Z79891 Long term (current) use of opiate analgesic: Secondary | ICD-10-CM | POA: Insufficient documentation

## 2017-07-22 DIAGNOSIS — I132 Hypertensive heart and chronic kidney disease with heart failure and with stage 5 chronic kidney disease, or end stage renal disease: Secondary | ICD-10-CM | POA: Insufficient documentation

## 2017-07-22 DIAGNOSIS — Z01810 Encounter for preprocedural cardiovascular examination: Secondary | ICD-10-CM

## 2017-07-22 DIAGNOSIS — N2581 Secondary hyperparathyroidism of renal origin: Secondary | ICD-10-CM | POA: Insufficient documentation

## 2017-07-22 NOTE — Progress Notes (Signed)
Advanced Heart Failure Clinic Note   Patient ID: [name], male   DOB: 08/06/1951, 66 y.o.   MRN: [id number]  PCP: Dr [name] Nephrologist: Dr Mercy Moore HF: Dr. [name]   HPI: [name] is 66 year old male with PMH: obesity, DM, COPD on nighttime oxygen, sleep apnea on CPAP, ESRD- HD, CAD, S/P Taxus drug-eluting stent to the right coronary in September 9323, chronic systolic/diastolic heart failure (Unable to tolerate statins so placed on Welchol), prostate CA s/p XRT   Had RHC in 2/11 which showed low pressures (PCWP = 2) , PA 26/16 (22)  and normal cardiac output.  Echo 12/12: EF 55% Myoview 10/15/12: EF 33%. LV Wall Motion: There is global hypokinesis. The LV is markedly enlarged. Small fixed apical defect.  cMRI 02/19/13: EF 37% dilated LV. diffuse HK Echo 6/15: EF 25-30%  Echo 4/16: EF 50-55%  Evaluated at [facility] for kidney transplant but he was not felt to be candidate (11/2012) due to poor mobility, DM, and coronary disease.   CPX 06/04/13   Resting HR: 76 Peak HR: 109 (70% age predicted max HR) BP rest: 107/70 BP peak: 154/69 Peak VO2: 11.8 (53.7% predicted peak VO2) - when corrected to ibw pVO2 15.7 VE/VCO2 slope: 28.8 OUES: 1.90 Peak RER: 1.06  Ve/MVV 34.7%itted    In 11/17 had NSTEMI underwent PCI LAD. RCA had long 80-90% but felt to be not amenable to PCI due to tortuosity. Admitted in 1/18 with SVT and small NSTEMI. Cath films reviewed again with Dr. [place] and RCA not approachable percutaneously.   Admitted 12/27/16 after transient chest pain and troponin came back mildly elevated in office. Troponin normal in ED here and thought stable for home. Pt thought he actually may have had a panic attack.   Admitted 10/18 with rectal bleeding felt due to severe radiation proctitis with ulcer on flex sig. Plavix stopped. Remains on ASA 81.   He presents today for follow up. Tolerating 81 mg daily without further bleeding. Remains on HD 3x/week. Pending back surgery for severe  back pain with R-sided sciatica. BP high when he goes in for HD but drops quickly Takes midodrine before HD . Remains very fatigued. Edema controlled with HD. No CP. No further SVT.   Echo 10/17/16 LVEF 40-45%, Grade 2 DD. Trivial MR, Mild LAE, Normal RV.  Past Medical History:  Diagnosis Date  . Allergic rhinitis, cause unspecified 02/24/2014  . Anemia 06/16/2011  . BENIGN PROSTATIC HYPERTROPHY 10/14/2009  . CAD, NATIVE VESSEL 02/06/2009   a. 06/2007 s/p Taxus DES to the RCA;  b. 08/2016 NSTEMI in setting of SVT/PCI: LM 30ost, LAD 91m (3.0x16 Synergy DES), LCX 22m, OM1 60, RI 40, RCA 70p/m, 27m - not amenable to PCI.  [name] Cervical radiculopathy, chronic 02/23/2016   Right c5-6 by NCS/EMG  . CHEST PAIN 03/29/2010  . Chronic combined systolic (congestive) and diastolic (congestive) heart failure ([place])    a. 10/2016 Echo: EF 40-45%, Gr2 DD. mildly dil LA.  [name] COLONIC POLYPS, HX OF 10/14/2009  . Dementia [id number]  . DEPRESSION 10/14/2009  . Depression 09/24/2015  . DIABETES MELLITUS, TYPE II 02/01/2010  . DIZZINESS 07/17/2010  . DYSLIPIDEMIA 06/18/2007  . ESRD (end stage renal disease) on dialysis ([place]) 08/04/2010   "TTS;  " (04/18/2015)  . FOOT PAIN 08/12/2008  . GAIT DISTURBANCE 03/03/2010  . GASTROENTERITIS, VIRAL 10/14/2009  . GERD 06/18/2007  . GOITER, MULTINODULAR 12/26/2007  . GOUT 06/18/2007  . GYNECOMASTIA 07/17/2010  . Hemodialysis access, fistula mature ([place])  Dialysis T-Th-Sa ([place]) Right upper arm fistula  . Hyperlipidemia 10/16/2011  . Hyperparathyroidism, secondary ([place]) 06/16/2011  . HYPERTENSION 06/18/2007  . Hyperthyroidism   . Hypocalcemia 06/07/2010  . Ischemic cardiomyopathy    a. 10/2016 Echo: EF 40-45%.  [name] NECK PAIN 07/31/2010  . ONYCHOMYCOSIS, TOENAILS 12/26/2007  . OSA on CPAP 10/16/2011  . Other malaise and fatigue 11/24/2009  . PERIPHERAL NEUROPATHY 06/18/2007  . Prostate cancer ([place])   . PSVT (paroxysmal supraventricular tachycardia) ([place])    a.  71/6967 complicated by NSTEMI;  b. 11/2016 Treated w/ adenosine in ED;  c. 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  [name] PULMONARY NODULE, RIGHT LOWER LOBE 06/08/2009  . Sleep apnea    cpap machine and o2  . TRANSAMINASES, SERUM, ELEVATED 02/01/2010  . Transfusion history    none recent  . Unspecified hypotension 01/30/2010    Current Outpatient Prescriptions  Medication Sig Dispense Refill  . acetaminophen (TYLENOL) 325 MG tablet Take 650 mg by mouth every 6 (six) hours as needed for mild pain or headache.     . allopurinol (ZYLOPRIM) 100 MG tablet Take 1 tablet (100 mg total) by mouth daily. (Patient taking differently: Take 100 mg by mouth at bedtime. ) 90 tablet 3  . aspirin EC 81 MG tablet Take 81 mg by mouth daily.    . citalopram (CELEXA) 10 MG tablet Take 10 mg by mouth at bedtime.     . colesevelam (WELCHOL) 625 MG tablet TAKE THREE TABLETS BY MOUTH TWICE DAILY WITH MEALS (Patient taking differently: Take 1,875 mg by mouth 2 (two) times daily with a meal. ) 180 tablet 11  . ezetimibe (ZETIA) 10 MG tablet TAKE ONE TABLET BY MOUTH ONCE DAILY (Patient taking differently: TAKE 1 TABLET (10 MG) BY MOUTH ONCE DAILY AT BEDTIME.) 90 tablet 3  . gabapentin (NEURONTIN) 300 MG capsule Take 1 capsule (300 mg total) by mouth at bedtime. (Patient taking differently: Take 600 mg by mouth at bedtime. )    . hydroxypropyl methylcellulose / hypromellose (ISOPTO TEARS / GONIOVISC) 2.5 % ophthalmic solution Place 1 drop into both eyes 3 (three) times daily as needed for dry eyes. 15 mL 11  . lanthanum (FOSRENOL) 1000 MG chewable tablet Chew 1,000 mg by mouth 2 (two) times daily.    . mesalamine (CANASA) 1000 MG suppository Place 0.5 suppositories (500 mg total) rectally at bedtime. (Patient taking differently: Place 500 mg rectally at bedtime as needed (for flare up). ) 30 suppository 2  . methimazole (TAPAZOLE) 5 MG tablet Take 1 tablet (5 mg total) by mouth daily. 30 tablet 11  . metoprolol tartrate (LOPRESSOR) 25 MG tablet  Take 1 tablet (25 mg total) by mouth 2 (two) times daily. ON NON-HD DAYS (Patient taking differently: Take 25 mg by mouth 2 (two) times daily. ON NON-HD DAYS (Sunday, Monday, Wednesday, and Fridays ONLY)) 144 tablet 3  . midodrine (PROAMATINE) 10 MG tablet Take 1 tablet (10 mg total) by mouth See admin instructions. Once a day only on dialysis days (Tues/Thurs/Sat) (Patient taking differently: Take 10 mg by mouth Every Tuesday,Thursday,and Saturday with dialysis. Once a day only on dialysis days (Tues/Thurs/Sat)) 30 tablet 0  . multivitamin (RENA-VIT) TABS tablet Take 1 tablet by mouth daily. Reported on 10/24/2015    . oxyCODONE (OXY IR/ROXICODONE) 5 MG immediate release tablet Take 5 mg by mouth every 6 (six) hours as needed. For pain.  0  . pantoprazole (PROTONIX) 40 MG tablet Take 40 mg by mouth every  evening.     . polyethylene glycol (MIRALAX / GLYCOLAX) packet Take 17 g by mouth daily. (Patient taking differently: Take 17 g by mouth daily as needed for mild constipation. ) 14 each 0  . SENSIPAR 90 MG tablet Take 90 mg by mouth 2 (two) times daily.     No current facility-administered medications for this encounter.     Vitals:   07/22/17 1441  BP: (!) 182/92  Pulse: 82  SpO2: 98%    PHYSICAL EXAM: General:  Fatigued appearing. No resp difficulty HEENT: normal Neck: supple. no JVD. Carotids 2+ bilat; no bruits. No lymphadenopathy or thryomegaly appreciated. Cor: PMI nondisplaced. Regular rate & rhythm. 2/6 SEM RUSB Lungs: clear Abdomen: obese, soft, nontender, nondistended. No hepatosplenomegaly. No bruits or masses. Good bowel sounds. Extremities: no cyanosis, clubbing, rash, edema Neuro: alert & orientedx3, cranial nerves grossly intact. moves all 4 extremities w/o difficulty. Affect pleasant   ASSESSMENT & PLAN:  1. Chronic Systolic Heart Fatigue:  Mixed ICM/NICM, EF 37% per c-MRI. EF 25-30% (Echo 6/15) Most recent EF 40-45% in 1/18 - Stable NYHA III symptoms  - Volume status  well controlled with HD.  - Off ACE/ARB/ARNI due to ESRD. Continue low-dose b-blocker as BP toelrtes 2. CAD - s/p PCI of LAD 11/17.  -  Recurrent NSTEMI in 1/18 due to SVT. Cath films reviewed has 80-90% long lesion in RCA but not amenable to PCI due to tortuosity - Currently no s/s of ischemia.  -  Continue ASA 81 mg daily. Has not been able to tolerate statins due to myositis. Continue Zetia and Welchol.  -  Has gone to CR. Perhaps can resume after his sciatica is fixed. . 3. HTN  -- Very labile. Having periods of low BP especially with HD.  -- Continue midodrine on HD days. -- Continue lopressor 25 mg BID -- Unable to make any changes currently with lability and hypotension during HD. Will defer BP management to Nephrology. 4. ESRD -  HD Tues/Thur/Sat 5. PSVT - Taking Lopressor 25 mg BID  - Resolved s/p ablation for AVNRT on 11/26/16 with resolution of symptoms.  6. Pre-op CV clearance - he is at moderate risk for peri-op CV complications. OK to proceed without further w/u.     Glori Bickers, MD  2:34 PM

## 2017-07-22 NOTE — Patient Instructions (Signed)
We will contact you in 6 months to schedule your next appointment.  

## 2017-07-23 DIAGNOSIS — D509 Iron deficiency anemia, unspecified: Secondary | ICD-10-CM | POA: Diagnosis not present

## 2017-07-23 DIAGNOSIS — N2581 Secondary hyperparathyroidism of renal origin: Secondary | ICD-10-CM | POA: Diagnosis not present

## 2017-07-23 DIAGNOSIS — E119 Type 2 diabetes mellitus without complications: Secondary | ICD-10-CM | POA: Diagnosis not present

## 2017-07-23 DIAGNOSIS — Z23 Encounter for immunization: Secondary | ICD-10-CM | POA: Diagnosis not present

## 2017-07-23 DIAGNOSIS — N186 End stage renal disease: Secondary | ICD-10-CM | POA: Diagnosis not present

## 2017-07-23 DIAGNOSIS — D631 Anemia in chronic kidney disease: Secondary | ICD-10-CM | POA: Diagnosis not present

## 2017-07-25 DIAGNOSIS — E119 Type 2 diabetes mellitus without complications: Secondary | ICD-10-CM | POA: Diagnosis not present

## 2017-07-25 DIAGNOSIS — N186 End stage renal disease: Secondary | ICD-10-CM | POA: Diagnosis not present

## 2017-07-25 DIAGNOSIS — N2581 Secondary hyperparathyroidism of renal origin: Secondary | ICD-10-CM | POA: Diagnosis not present

## 2017-07-25 DIAGNOSIS — D509 Iron deficiency anemia, unspecified: Secondary | ICD-10-CM | POA: Diagnosis not present

## 2017-07-25 DIAGNOSIS — Z23 Encounter for immunization: Secondary | ICD-10-CM | POA: Diagnosis not present

## 2017-07-25 DIAGNOSIS — D631 Anemia in chronic kidney disease: Secondary | ICD-10-CM | POA: Diagnosis not present

## 2017-07-25 NOTE — Pre-Procedure Instructions (Signed)
JASPAL PULTZ  07/25/2017      [facility] ([place]), [place] - House [address] [name] ([place]) [place] [address] Phone: [phone] Fax: [phone]  [facility] - [place], [place] [address] Phone: [phone] Fax: [phone]    Your procedure is scheduled on Wednesday, July 31, 2017  Report to [facility] Admitting Entrance "A" at 6:00 A.M.   Call this number if you have problems the morning of surgery:  [phone]   Remember:  Do not eat food or drink liquids after midnight.  Take these medicines the morning of surgery with A SIP OF WATER: Methimazole (TAPAZOLE) and Metoprolol tartrate (LOPRESSOR). If needed Acetaminophen (TYLENOL) for mild pain and OxyCODONE (OXY IR/ROXICODONE) for moderate pain.  Follow your doctor's instruction regarding Aspirin.  As of today, stop taking all Aspirins, Vitamins, Fish oils, and Herbal medications. Also stop all NSAIDS i.e. Advil, Ibuprofen, Motrin, Aleve, Anaprox, Naproxen, BC and Goody Powders.  How to Manage Your Diabetes Before and After Surgery  Why is it important to control my blood sugar before and after surgery? . Improving blood sugar levels before and after surgery helps healing and can limit problems. . A way of improving blood sugar control is eating a healthy diet by: o  Eating less sugar and carbohydrates o  Increasing activity/exercise o  Talking with your doctor about reaching your blood sugar goals . High blood sugars (greater than 180 mg/dL) can raise your risk of infections and slow your recovery, so you will need to focus on controlling your diabetes during the weeks before surgery. . Make sure that the doctor who takes care of your diabetes knows about your planned surgery including the date and location.  How do I manage my blood sugar before surgery? . Check your blood sugar at least 4 times a  day, starting 2 days before surgery, to make sure that the level is not too high or low. o Check your blood sugar the morning of your surgery when you wake up and every 2 hours until you get to the Short Stay unit. . If your blood sugar is less than 70 mg/dL, you will need to treat for low blood sugar: o Do not take insulin. o Treat a low blood sugar (less than 70 mg/dL) with  cup of clear juice (cranberry or apple), 4 glucose tablets, OR glucose gel. o Recheck blood sugar in 15 minutes after treatment (to make sure it is greater than 70 mg/dL). If your blood sugar is not greater than 70 mg/dL on recheck, call [phone] for further instructions. . Report your blood sugar to the short stay nurse when you get to Short Stay.  . If you are admitted to the hospital after surgery: o Your blood sugar will be checked by the staff and you will probably be given insulin after surgery (instead of oral diabetes medicines) to make sure you have good blood sugar levels. o The goal for blood sugar control after surgery is 80-180 mg/dL.  . If your CBG is greater than 220 mg/dL, call us at [phone]   Do not wear jewelry.  Do not wear lotions, powders, colognes, or deodorant.  Do not shave 48 hours prior to surgery.  Men may shave face and neck.  Do not bring valuables to the hospital.  [facility] is not responsible for any belongings or valuables.  Contacts,  dentures or bridgework may not be worn into surgery.  Leave your suitcase in the car.  After surgery it may be brought to your room.  For patients admitted to the hospital, discharge time will be determined by your treatment team.  Patients discharged the day of surgery will not be allowed to drive home.   Special instructions:   [place]- Preparing For Surgery  Before surgery, you can play an important role. Because skin is not sterile, your skin needs to be as free of germs as possible. You can reduce the number of germs on your skin by  washing with CHG (chlorahexidine gluconate) Soap before surgery.  CHG is an antiseptic cleaner which kills germs and bonds with the skin to continue killing germs even after washing.  Please do not use if you have an allergy to CHG or antibacterial soaps. If your skin becomes reddened/irritated stop using the CHG.  Do not shave (including legs and underarms) for at least 48 hours prior to first CHG shower. It is OK to shave your face.  Please follow these instructions carefully.   1. Shower the NIGHT BEFORE SURGERY and the MORNING OF SURGERY with CHG.   2. If you chose to wash your hair, wash your hair first as usual with your normal shampoo.  3. After you shampoo, rinse your hair and body thoroughly to remove the shampoo.  4. Use CHG as you would any other liquid soap. You can apply CHG directly to the skin and wash gently with a scrungie or a clean washcloth.   5. Apply the CHG Soap to your body ONLY FROM THE NECK DOWN.  Do not use on open wounds or open sores. Avoid contact with your eyes, ears, mouth and genitals (private parts). Wash Face and genitals (private parts)  with your normal soap.  6. Wash thoroughly, paying special attention to the area where your surgery will be performed.  7. Thoroughly rinse your body with warm water from the neck down.  8. DO NOT shower/wash with your normal soap after using and rinsing off the CHG Soap.  9. Pat yourself dry with a CLEAN TOWEL.  10. Wear CLEAN PAJAMAS to bed the night before surgery, wear comfortable clothes the morning of surgery  11. Place CLEAN SHEETS on your bed the night of your first shower and DO NOT SLEEP WITH PETS.  Day of Surgery: Do not apply any deodorants/lotions. Please wear clean clothes to the hospital/surgery center.    Please read over the following fact sheets that you were given. Pain Booklet, Coughing and Deep Breathing, MRSA Information and Surgical Site Infection Prevention

## 2017-07-26 ENCOUNTER — Encounter (HOSPITAL_COMMUNITY)
Admission: RE | Admit: 2017-07-26 | Discharge: 2017-07-26 | Disposition: A | Discharge status: Home / Self Care | Payer: Medicare Other | Source: Ambulatory Visit | Attending: Neurosurgery | Admitting: Neurosurgery

## 2017-07-26 ENCOUNTER — Encounter (HOSPITAL_COMMUNITY): Payer: Self-pay

## 2017-07-26 DIAGNOSIS — Z87891 Personal history of nicotine dependence: Secondary | ICD-10-CM | POA: Insufficient documentation

## 2017-07-26 DIAGNOSIS — G4733 Obstructive sleep apnea (adult) (pediatric): Secondary | ICD-10-CM | POA: Insufficient documentation

## 2017-07-26 DIAGNOSIS — E0590 Thyrotoxicosis, unspecified without thyrotoxic crisis or storm: Secondary | ICD-10-CM | POA: Insufficient documentation

## 2017-07-26 DIAGNOSIS — R911 Solitary pulmonary nodule: Secondary | ICD-10-CM | POA: Diagnosis not present

## 2017-07-26 DIAGNOSIS — I110 Hypertensive heart disease with heart failure: Secondary | ICD-10-CM | POA: Diagnosis not present

## 2017-07-26 DIAGNOSIS — K219 Gastro-esophageal reflux disease without esophagitis: Secondary | ICD-10-CM | POA: Diagnosis not present

## 2017-07-26 DIAGNOSIS — I2510 Atherosclerotic heart disease of native coronary artery without angina pectoris: Secondary | ICD-10-CM | POA: Insufficient documentation

## 2017-07-26 DIAGNOSIS — Z7982 Long term (current) use of aspirin: Secondary | ICD-10-CM | POA: Insufficient documentation

## 2017-07-26 DIAGNOSIS — Z01812 Encounter for preprocedural laboratory examination: Secondary | ICD-10-CM | POA: Insufficient documentation

## 2017-07-26 DIAGNOSIS — D649 Anemia, unspecified: Secondary | ICD-10-CM | POA: Diagnosis not present

## 2017-07-26 DIAGNOSIS — E042 Nontoxic multinodular goiter: Secondary | ICD-10-CM | POA: Diagnosis not present

## 2017-07-26 DIAGNOSIS — N186 End stage renal disease: Secondary | ICD-10-CM | POA: Insufficient documentation

## 2017-07-26 DIAGNOSIS — E785 Hyperlipidemia, unspecified: Secondary | ICD-10-CM | POA: Insufficient documentation

## 2017-07-26 DIAGNOSIS — C61 Malignant neoplasm of prostate: Secondary | ICD-10-CM | POA: Diagnosis not present

## 2017-07-26 DIAGNOSIS — I255 Ischemic cardiomyopathy: Secondary | ICD-10-CM | POA: Insufficient documentation

## 2017-07-26 DIAGNOSIS — Z79899 Other long term (current) drug therapy: Secondary | ICD-10-CM | POA: Insufficient documentation

## 2017-07-26 DIAGNOSIS — E119 Type 2 diabetes mellitus without complications: Secondary | ICD-10-CM | POA: Insufficient documentation

## 2017-07-26 DIAGNOSIS — I5042 Chronic combined systolic (congestive) and diastolic (congestive) heart failure: Secondary | ICD-10-CM | POA: Insufficient documentation

## 2017-07-26 HISTORY — DX: Spinal stenosis, lumbar region with neurogenic claudication: M48.062

## 2017-07-26 LAB — GLUCOSE, CAPILLARY: Glucose-Capillary: 125 mg/dL — ABNORMAL HIGH (ref 65–99)

## 2017-07-26 LAB — CBC
HCT: 39.7 % (ref 39.0–52.0)
HEMOGLOBIN: 12.1 g/dL — AB (ref 13.0–17.0)
MCH: 28.3 pg (ref 26.0–34.0)
MCHC: 30.5 g/dL (ref 30.0–36.0)
MCV: 93 fL (ref 78.0–100.0)
Platelets: 253 10*3/uL (ref 150–400)
RBC: 4.27 MIL/uL (ref 4.22–5.81)
RDW: 18.2 % — AB (ref 11.5–15.5)
WBC: 7.9 10*3/uL (ref 4.0–10.5)

## 2017-07-26 LAB — BASIC METABOLIC PANEL
ANION GAP: 11 (ref 5–15)
BUN: 33 mg/dL — ABNORMAL HIGH (ref 6–20)
CALCIUM: 10 mg/dL (ref 8.9–10.3)
CO2: 27 mmol/L (ref 22–32)
Chloride: 102 mmol/L (ref 101–111)
Creatinine, Ser: 8.02 mg/dL — ABNORMAL HIGH (ref 0.61–1.24)
GFR calc Af Amer: 7 mL/min — ABNORMAL LOW (ref >60)
GFR, EST NON AFRICAN AMERICAN: 6 mL/min — AB (ref >60)
GLUCOSE: 139 mg/dL — AB (ref 65–99)
Potassium: 4.7 mmol/L (ref 3.5–5.1)
SODIUM: 140 mmol/L (ref 135–145)

## 2017-07-26 LAB — SURGICAL PCR SCREEN
MRSA, PCR: NEGATIVE
STAPHYLOCOCCUS AUREUS: NEGATIVE

## 2017-07-26 NOTE — Progress Notes (Addendum)
PCP - Dr. [name]- [place]  Cardiologist - Dr. Glori Bickers  Chest x-ray - 05/18/17 (E)  EKG - 05/16/17 (E)  Stress Test - 05/27/16  ECHO - 10/17/16  Cardiac Cath - 116-03/2016  Sleep Study - Yes CPAP - Yes- Does not know settings, but will bring in the day of surgery  HA1C- 07/02/17-4.8 Fasting Blood Sugar - 120-130, Today 125 Checks Blood Sugar ___1__ time a day  Chart will be given to anesthesia for review due to history.  Pt sts he stopped taking Aspirin since Monday 07/22/17.  Pt denies having chest pain, sob, or fever at this time. All instructions explained to the pt, with a verbal understanding of the material. Pt agrees to go over the instructions while at home for a better understanding. The opportunity to ask questions was provided.

## 2017-07-27 DIAGNOSIS — N2581 Secondary hyperparathyroidism of renal origin: Secondary | ICD-10-CM | POA: Diagnosis not present

## 2017-07-27 DIAGNOSIS — D631 Anemia in chronic kidney disease: Secondary | ICD-10-CM | POA: Diagnosis not present

## 2017-07-27 DIAGNOSIS — D509 Iron deficiency anemia, unspecified: Secondary | ICD-10-CM | POA: Diagnosis not present

## 2017-07-27 DIAGNOSIS — Z23 Encounter for immunization: Secondary | ICD-10-CM | POA: Diagnosis not present

## 2017-07-27 DIAGNOSIS — E119 Type 2 diabetes mellitus without complications: Secondary | ICD-10-CM | POA: Diagnosis not present

## 2017-07-27 DIAGNOSIS — N186 End stage renal disease: Secondary | ICD-10-CM | POA: Diagnosis not present

## 2017-07-29 DIAGNOSIS — R159 Full incontinence of feces: Secondary | ICD-10-CM | POA: Diagnosis not present

## 2017-07-29 DIAGNOSIS — K921 Melena: Secondary | ICD-10-CM | POA: Diagnosis not present

## 2017-07-29 NOTE — Progress Notes (Addendum)
Anesthesia Chart Review:  Pt is a 66 year old females scheduled for L2-3, laminectomy and foraminotomy on 07/31/2017 Earnie Larsson, MD  - PCP is [name], MD - Pulmonologist is Chesley Mires, MD. Last office visit 07/08/17; 1 year f/u recommended - Nephrologist is Fleet Contras, MD  - EP cardiologist is Allegra Lai, MD. Last office visit 03/25/17; 1 year f/u recommended.   - Cardiologist is Glori Bickers, MD who cleared pt at last office visit 07/22/17 for surgery at moderate risk for peri-op CV complications  PMH includes:  CAD (s/p DES to RCA 2008; DES to LAD, residual 80-90% RCA disease 2017), PSVT (s/p RFCA for AVNRT 11/26/16), ischemic cardiomyopathy, chronic combined systolic and diastolic CHF, HTN, DM, hyperlipidemia, ESRD (on hemodialysis),  pulmonary nodule (on CT 08/10/15; I don't see it has had f/u), hyperthyroidism, multinodular goiter, OSA, anemia, prostate cancer, GERD. Former smoker. BMI 29. S/p R TKA 08/02/15.   - Hospitalized 10/1-5/18 for rectal bleeding due to radiation proctitis and ulcer.  S/p 2 units PRBCs.   - Hospitalized 8/16-22/18 for rectal bleeding. S/p 1 unit PRBC, platelets, DDAVP.   - Hospitalized 7/23-28/18 for chronic intermittent lower GI bleed  Medications include: ASA 81mg , colesevelam, zetia, methimazole, metoprolol, midodrine, protonix  BP 126/77   Pulse 71   Temp 36.8 C   Resp 18   Ht 6\' 1"  (1.854 m)   Wt 221 lb 11.2 oz (100.6 kg)   SpO2 100%   BMI 29.25 kg/m   Preoperative labs reviewed.  Renal function consistent with ESRD.   1 view CXR 05/18/17: No acute cardiopulmonary disease.  EKG 05/16/17:  - Sinus rhythm. Borderline T abnormalities, lateral leads. Borderline prolonged QT interval  Echo 10/17/16:  - Left ventricle: The cavity size was normal. Systolic function was mildly to moderately reduced. The estimated ejection fraction was in the range of 40% to 45%. Wall motion was normal; there were no regional wall motion abnormalities.  Features are consistent with a pseudonormal left ventricular filling pattern, with concomitant abnormal relaxation and increased filling pressure (grade 2 diastolic dysfunction). Doppler parameters are consistent with elevated ventricular end-diastolic filling pressure. - Aortic valve: There was no regurgitation. - Aortic root: The aortic root was normal in size. - Mitral valve: There was trivial regurgitation. - Left atrium: The atrium was mildly dilated. - Right ventricle: Systolic function was normal. - Right atrium: The atrium was normal in size. - Pulmonary arteries: Systolic pressure was within the normal range. - Inferior vena cava: The vessel was normal in size. - Pericardium, extracardiac: There was no pericardial effusion. - Impressions: Compared to the prior study on 01/12/2015 LVEF decreased, previously 55%, now 40-45%, there is basal inferior wall hypokinesis.  Cardiac cath 08/06/16:   Mid LAD lesion, 90 %stenosed.  Ost LM to LM lesion, 30 %stenosed.  Prox Cx to Mid Cx lesion, 40 %stenosed.  1st Mrg lesion, 60 %stenosed.  Ramus lesion, 40 %stenosed.  Prox RCA to Mid RCA lesion, 70 %stenosed.  Mid RCA lesion, 80 %stenosed.  Dist RCA lesion, 0 %stenosed. Assessment: 1. Heavily calcified coronary arteries 2. High grade lesion in midLAD (90%) and borderline lesion midRCA (70-80%) 3. LVEDP 17  Coronary atherectomy 08/07/16:   A STENT SYNERGY DES 3X16 drug eluting stent was successfully placed.  Mid LAD lesion, 90 %stenosed.  Post intervention, there is a 0% residual stenosis. 1. Successful orbital atherectomy and stenting of the mid LAD with DES  CT angio chest 08/10/15:  1. No evidence of pulmonary embolism. 2.  Pulmonary venous hypertensive changes and possible early interstitial edema. 3. 6 x 8 mm posterior right upper lobe nodule. This appears new since 2011. If the patient is at high risk for bronchogenic carcinoma, follow-up chest CT at 3-6 months is  recommended. If the patient is at low risk for bronchogenic carcinoma, follow-up chest CT at 6-12 months is recommended.  4. Coronary artery disease with heavily calcified coronary arteries in a 3 vessel distribution.  If no changes, I anticipate pt can proceed with surgery as scheduled.   Willeen Cass, FNP-BC [facility] Stay [facility]/Anesthesiology Phone: [phone] 07/29/2017 1:01 PM

## 2017-07-30 DIAGNOSIS — Z23 Encounter for immunization: Secondary | ICD-10-CM | POA: Diagnosis not present

## 2017-07-30 DIAGNOSIS — D631 Anemia in chronic kidney disease: Secondary | ICD-10-CM | POA: Diagnosis not present

## 2017-07-30 DIAGNOSIS — D509 Iron deficiency anemia, unspecified: Secondary | ICD-10-CM | POA: Diagnosis not present

## 2017-07-30 DIAGNOSIS — E119 Type 2 diabetes mellitus without complications: Secondary | ICD-10-CM | POA: Diagnosis not present

## 2017-07-30 DIAGNOSIS — N2581 Secondary hyperparathyroidism of renal origin: Secondary | ICD-10-CM | POA: Diagnosis not present

## 2017-07-30 DIAGNOSIS — N186 End stage renal disease: Secondary | ICD-10-CM | POA: Diagnosis not present

## 2017-07-30 MED ORDER — VANCOMYCIN HCL 10 G IV SOLR
1500.0000 mg | INTRAVENOUS | Status: AC
  Administered 2017-07-31: 1500 mg via INTRAVENOUS
  Filled 2017-07-30: qty 1500

## 2017-07-30 NOTE — Anesthesia Preprocedure Evaluation (Addendum)
Anesthesia Evaluation  Patient identified by MRN, date of birth, ID band Patient awake    Reviewed: Allergy & Precautions, H&P , NPO status , Patient's Chart, lab work & pertinent test results, reviewed documented beta blocker date and time   Airway Mallampati: II  TM Distance: >3 FB Neck ROM: full    Dental no notable dental hx. (+) Poor Dentition, Dental Advisory Given   Pulmonary sleep apnea , former smoker,    Pulmonary exam normal breath sounds clear to auscultation       Cardiovascular hypertension,  Rhythm:regular Rate:Normal     Neuro/Psych    GI/Hepatic   Endo/Other  diabetes  Renal/GU CRF and Dialysis     Musculoskeletal   Abdominal   Peds  Hematology   Anesthesia Other Findings   Reproductive/Obstetrics                            Anesthesia Physical Anesthesia Plan  ASA: III  Anesthesia Plan: General   Post-op Pain Management:    Induction: Intravenous  PONV Risk Score and Plan: 2 and Ondansetron, Dexamethasone and Treatment may vary due to age or medical condition  Airway Management Planned: Oral ETT  Additional Equipment:   Intra-op Plan:   Post-operative Plan: Extubation in OR  Informed Consent: I have reviewed the patients History and Physical, chart, labs and discussed the procedure including the risks, benefits and alternatives for the proposed anesthesia with the patient or authorized representative who has indicated his/her understanding and acceptance.   Dental Advisory Given  Plan Discussed with: CRNA and Surgeon  Anesthesia Plan Comments: (  )        Anesthesia Quick Evaluation

## 2017-07-31 ENCOUNTER — Ambulatory Visit (HOSPITAL_COMMUNITY): Payer: Medicare Other | Admitting: Emergency Medicine

## 2017-07-31 ENCOUNTER — Observation Stay (HOSPITAL_COMMUNITY)
Admission: RE | Admit: 2017-07-31 | Discharge: 2017-08-01 | Disposition: A | Discharge status: Home / Self Care | Payer: Medicare Other | Source: Ambulatory Visit | Attending: Neurosurgery | Admitting: Neurosurgery

## 2017-07-31 ENCOUNTER — Encounter (HOSPITAL_COMMUNITY): Payer: Self-pay | Admitting: Urology

## 2017-07-31 ENCOUNTER — Ambulatory Visit (HOSPITAL_COMMUNITY): Payer: Medicare Other | Admitting: Anesthesiology

## 2017-07-31 ENCOUNTER — Ambulatory Visit (HOSPITAL_COMMUNITY): Payer: Medicare Other

## 2017-07-31 ENCOUNTER — Encounter (HOSPITAL_COMMUNITY)
Admission: RE | Disposition: A | Discharge status: Home / Self Care | Payer: Self-pay | Source: Ambulatory Visit | Attending: Neurosurgery

## 2017-07-31 DIAGNOSIS — Z7982 Long term (current) use of aspirin: Secondary | ICD-10-CM | POA: Insufficient documentation

## 2017-07-31 DIAGNOSIS — N2581 Secondary hyperparathyroidism of renal origin: Secondary | ICD-10-CM | POA: Insufficient documentation

## 2017-07-31 DIAGNOSIS — E1122 Type 2 diabetes mellitus with diabetic chronic kidney disease: Secondary | ICD-10-CM | POA: Insufficient documentation

## 2017-07-31 DIAGNOSIS — Z87891 Personal history of nicotine dependence: Secondary | ICD-10-CM | POA: Diagnosis not present

## 2017-07-31 DIAGNOSIS — Z992 Dependence on renal dialysis: Secondary | ICD-10-CM | POA: Insufficient documentation

## 2017-07-31 DIAGNOSIS — I132 Hypertensive heart and chronic kidney disease with heart failure and with stage 5 chronic kidney disease, or end stage renal disease: Secondary | ICD-10-CM | POA: Diagnosis not present

## 2017-07-31 DIAGNOSIS — M109 Gout, unspecified: Secondary | ICD-10-CM | POA: Diagnosis not present

## 2017-07-31 DIAGNOSIS — N189 Chronic kidney disease, unspecified: Secondary | ICD-10-CM | POA: Diagnosis not present

## 2017-07-31 DIAGNOSIS — Z79899 Other long term (current) drug therapy: Secondary | ICD-10-CM | POA: Insufficient documentation

## 2017-07-31 DIAGNOSIS — F329 Major depressive disorder, single episode, unspecified: Secondary | ICD-10-CM | POA: Diagnosis not present

## 2017-07-31 DIAGNOSIS — E1129 Type 2 diabetes mellitus with other diabetic kidney complication: Secondary | ICD-10-CM | POA: Diagnosis not present

## 2017-07-31 DIAGNOSIS — I471 Supraventricular tachycardia: Secondary | ICD-10-CM | POA: Diagnosis not present

## 2017-07-31 DIAGNOSIS — Z955 Presence of coronary angioplasty implant and graft: Secondary | ICD-10-CM | POA: Insufficient documentation

## 2017-07-31 DIAGNOSIS — F0390 Unspecified dementia without behavioral disturbance: Secondary | ICD-10-CM | POA: Insufficient documentation

## 2017-07-31 DIAGNOSIS — N186 End stage renal disease: Secondary | ICD-10-CM | POA: Insufficient documentation

## 2017-07-31 DIAGNOSIS — G4733 Obstructive sleep apnea (adult) (pediatric): Secondary | ICD-10-CM | POA: Insufficient documentation

## 2017-07-31 DIAGNOSIS — M48062 Spinal stenosis, lumbar region with neurogenic claudication: Principal | ICD-10-CM | POA: Insufficient documentation

## 2017-07-31 DIAGNOSIS — M48061 Spinal stenosis, lumbar region without neurogenic claudication: Secondary | ICD-10-CM | POA: Diagnosis not present

## 2017-07-31 DIAGNOSIS — E1140 Type 2 diabetes mellitus with diabetic neuropathy, unspecified: Secondary | ICD-10-CM | POA: Insufficient documentation

## 2017-07-31 DIAGNOSIS — K219 Gastro-esophageal reflux disease without esophagitis: Secondary | ICD-10-CM | POA: Insufficient documentation

## 2017-07-31 DIAGNOSIS — N400 Enlarged prostate without lower urinary tract symptoms: Secondary | ICD-10-CM | POA: Diagnosis not present

## 2017-07-31 DIAGNOSIS — E785 Hyperlipidemia, unspecified: Secondary | ICD-10-CM | POA: Insufficient documentation

## 2017-07-31 DIAGNOSIS — Z8546 Personal history of malignant neoplasm of prostate: Secondary | ICD-10-CM | POA: Diagnosis not present

## 2017-07-31 DIAGNOSIS — I2510 Atherosclerotic heart disease of native coronary artery without angina pectoris: Secondary | ICD-10-CM | POA: Insufficient documentation

## 2017-07-31 DIAGNOSIS — I130 Hypertensive heart and chronic kidney disease with heart failure and stage 1 through stage 4 chronic kidney disease, or unspecified chronic kidney disease: Secondary | ICD-10-CM | POA: Diagnosis not present

## 2017-07-31 DIAGNOSIS — Z419 Encounter for procedure for purposes other than remedying health state, unspecified: Secondary | ICD-10-CM

## 2017-07-31 DIAGNOSIS — I5032 Chronic diastolic (congestive) heart failure: Secondary | ICD-10-CM | POA: Diagnosis not present

## 2017-07-31 DIAGNOSIS — I252 Old myocardial infarction: Secondary | ICD-10-CM | POA: Insufficient documentation

## 2017-07-31 DIAGNOSIS — I5042 Chronic combined systolic (congestive) and diastolic (congestive) heart failure: Secondary | ICD-10-CM | POA: Insufficient documentation

## 2017-07-31 HISTORY — PX: LUMBAR LAMINECTOMY/DECOMPRESSION MICRODISCECTOMY: SHX5026

## 2017-07-31 LAB — POCT I-STAT 4, (NA,K, GLUC, HGB,HCT)
GLUCOSE: 131 mg/dL — AB (ref 65–99)
HEMATOCRIT: 38 % — AB (ref 39.0–52.0)
Hemoglobin: 12.9 g/dL — ABNORMAL LOW (ref 13.0–17.0)
POTASSIUM: 4.2 mmol/L (ref 3.5–5.1)
Sodium: 141 mmol/L (ref 135–145)

## 2017-07-31 LAB — GLUCOSE, CAPILLARY: GLUCOSE-CAPILLARY: 113 mg/dL — AB (ref 65–99)

## 2017-07-31 SURGERY — LUMBAR LAMINECTOMY/DECOMPRESSION MICRODISCECTOMY 1 LEVEL
Anesthesia: General | Site: Back | Laterality: Bilateral

## 2017-07-31 MED ORDER — SODIUM CHLORIDE 0.9 % IV SOLN
INTRAVENOUS | Status: DC
  Administered 2017-07-31: 07:00:00 via INTRAVENOUS

## 2017-07-31 MED ORDER — POLYETHYLENE GLYCOL 3350 17 G PO PACK: 17.0000 g | PACK | Freq: Every day | ORAL | Status: DC | PRN

## 2017-07-31 MED ORDER — ONDANSETRON HCL 4 MG/2ML IJ SOLN: 4.0000 mg | Freq: Four times a day (QID) | INTRAMUSCULAR | Status: DC | PRN

## 2017-07-31 MED ORDER — ROCURONIUM BROMIDE 100 MG/10ML IV SOLN
INTRAVENOUS | Status: DC | PRN
  Administered 2017-07-31: 50 mg via INTRAVENOUS

## 2017-07-31 MED ORDER — DEXAMETHASONE SODIUM PHOSPHATE 10 MG/ML IJ SOLN
INTRAMUSCULAR | Status: DC | PRN
  Administered 2017-07-31: 5 mg via INTRAVENOUS

## 2017-07-31 MED ORDER — EZETIMIBE 10 MG PO TABS
10.0000 mg | ORAL_TABLET | Freq: Every day | ORAL | Status: DC
  Administered 2017-07-31: 10 mg via ORAL
  Filled 2017-07-31 (×2): qty 1

## 2017-07-31 MED ORDER — ONDANSETRON HCL 4 MG/2ML IJ SOLN
INTRAMUSCULAR | Status: AC
  Filled 2017-07-31: qty 2

## 2017-07-31 MED ORDER — ONDANSETRON HCL 4 MG/2ML IJ SOLN
INTRAMUSCULAR | Status: DC | PRN
  Administered 2017-07-31: 4 mg via INTRAVENOUS

## 2017-07-31 MED ORDER — FENTANYL CITRATE (PF) 100 MCG/2ML IJ SOLN
INTRAMUSCULAR | Status: DC | PRN
  Administered 2017-07-31 (×2): 50 ug via INTRAVENOUS

## 2017-07-31 MED ORDER — HEMOSTATIC AGENTS (NO CHARGE) OPTIME
TOPICAL | Status: DC | PRN
  Administered 2017-07-31: 1 via TOPICAL

## 2017-07-31 MED ORDER — METOPROLOL TARTRATE 25 MG PO TABS
25.0000 mg | ORAL_TABLET | Freq: Two times a day (BID) | ORAL | Status: DC
  Administered 2017-07-31: 25 mg via ORAL
  Filled 2017-07-31: qty 1

## 2017-07-31 MED ORDER — MESALAMINE 1000 MG RE SUPP: 500.0000 mg | Freq: Every evening | RECTAL | Status: DC | PRN

## 2017-07-31 MED ORDER — COLESEVELAM HCL 625 MG PO TABS
1875.0000 mg | ORAL_TABLET | Freq: Two times a day (BID) | ORAL | Status: DC
  Administered 2017-07-31: 1875 mg via ORAL
  Filled 2017-07-31 (×3): qty 3

## 2017-07-31 MED ORDER — MIDAZOLAM HCL 5 MG/5ML IJ SOLN
INTRAMUSCULAR | Status: DC | PRN
  Administered 2017-07-31: 1 mg via INTRAVENOUS

## 2017-07-31 MED ORDER — NEOSTIGMINE METHYLSULFATE 10 MG/10ML IV SOLN
INTRAVENOUS | Status: DC | PRN
  Administered 2017-07-31: 5 mg via INTRAVENOUS

## 2017-07-31 MED ORDER — GLYCOPYRROLATE 0.2 MG/ML IJ SOLN
INTRAMUSCULAR | Status: DC | PRN
  Administered 2017-07-31: .8 mg via INTRAVENOUS

## 2017-07-31 MED ORDER — CHLORHEXIDINE GLUCONATE CLOTH 2 % EX PADS: 6.0000 | MEDICATED_PAD | Freq: Once | CUTANEOUS | Status: DC

## 2017-07-31 MED ORDER — PHENOL 1.4 % MT LIQD: 1.0000 | OROMUCOSAL | Status: DC | PRN

## 2017-07-31 MED ORDER — BACITRACIN 50000 UNITS IM SOLR
INTRAMUSCULAR | Status: DC | PRN
  Administered 2017-07-31: 09:00:00

## 2017-07-31 MED ORDER — MIDAZOLAM HCL 2 MG/2ML IJ SOLN
INTRAMUSCULAR | Status: AC
  Filled 2017-07-31: qty 2

## 2017-07-31 MED ORDER — FENTANYL CITRATE (PF) 250 MCG/5ML IJ SOLN
INTRAMUSCULAR | Status: AC
  Filled 2017-07-31: qty 5

## 2017-07-31 MED ORDER — FENTANYL CITRATE (PF) 100 MCG/2ML IJ SOLN
25.0000 ug | INTRAMUSCULAR | Status: DC | PRN
  Administered 2017-07-31 (×2): 50 ug via INTRAVENOUS

## 2017-07-31 MED ORDER — PANTOPRAZOLE SODIUM 40 MG PO TBEC
40.0000 mg | DELAYED_RELEASE_TABLET | Freq: Every evening | ORAL | Status: DC
Start: 2017-07-31 — End: 2017-08-01
  Administered 2017-07-31: 40 mg via ORAL
  Filled 2017-07-31: qty 1

## 2017-07-31 MED ORDER — DEXAMETHASONE SODIUM PHOSPHATE 10 MG/ML IJ SOLN
INTRAMUSCULAR | Status: AC
  Filled 2017-07-31: qty 1

## 2017-07-31 MED ORDER — BUPIVACAINE HCL (PF) 0.25 % IJ SOLN
INTRAMUSCULAR | Status: DC | PRN
  Administered 2017-07-31: 20 mL

## 2017-07-31 MED ORDER — LANTHANUM CARBONATE 500 MG PO CHEW
1000.0000 mg | CHEWABLE_TABLET | Freq: Two times a day (BID) | ORAL | Status: DC
Start: 2017-07-31 — End: 2017-08-01
  Administered 2017-07-31: 1000 mg via ORAL
  Filled 2017-07-31 (×3): qty 2

## 2017-07-31 MED ORDER — HYPROMELLOSE (GONIOSCOPIC) 2.5 % OP SOLN: 1.0000 [drp] | Freq: Three times a day (TID) | OPHTHALMIC | Status: DC | PRN

## 2017-07-31 MED ORDER — PROPOFOL 10 MG/ML IV BOLUS
INTRAVENOUS | Status: DC | PRN
  Administered 2017-07-31: 40 mg via INTRAVENOUS
  Administered 2017-07-31: 100 mg via INTRAVENOUS
  Administered 2017-07-31: 40 mg via INTRAVENOUS

## 2017-07-31 MED ORDER — THROMBIN (RECOMBINANT) 5000 UNITS EX SOLR
CUTANEOUS | Status: AC
  Filled 2017-07-31: qty 10000

## 2017-07-31 MED ORDER — CYCLOBENZAPRINE HCL 10 MG PO TABS
ORAL_TABLET | ORAL | Status: AC
  Administered 2017-07-31: 10 mg via ORAL
  Filled 2017-07-31: qty 1

## 2017-07-31 MED ORDER — MENTHOL 3 MG MT LOZG
1.0000 | LOZENGE | OROMUCOSAL | Status: DC | PRN
  Filled 2017-07-31: qty 9

## 2017-07-31 MED ORDER — RENA-VITE PO TABS
1.0000 | ORAL_TABLET | Freq: Every day | ORAL | Status: DC
  Administered 2017-07-31: 1 via ORAL
  Filled 2017-07-31 (×2): qty 1

## 2017-07-31 MED ORDER — OXYCODONE HCL 5 MG PO TABS: 5.0000 mg | ORAL_TABLET | ORAL | Status: DC | PRN

## 2017-07-31 MED ORDER — SODIUM CHLORIDE 0.9% FLUSH: 3.0000 mL | Freq: Two times a day (BID) | INTRAVENOUS | Status: DC

## 2017-07-31 MED ORDER — CYCLOBENZAPRINE HCL 10 MG PO TABS
10.0000 mg | ORAL_TABLET | Freq: Three times a day (TID) | ORAL | Status: DC | PRN
  Administered 2017-07-31 (×2): 10 mg via ORAL
  Filled 2017-07-31: qty 1

## 2017-07-31 MED ORDER — FENTANYL CITRATE (PF) 100 MCG/2ML IJ SOLN
INTRAMUSCULAR | Status: AC
  Administered 2017-07-31: 50 ug via INTRAVENOUS
  Filled 2017-07-31: qty 2

## 2017-07-31 MED ORDER — SODIUM CHLORIDE 0.9% FLUSH: 3.0000 mL | INTRAVENOUS | Status: DC | PRN

## 2017-07-31 MED ORDER — CITALOPRAM HYDROBROMIDE 10 MG PO TABS
10.0000 mg | ORAL_TABLET | Freq: Every day | ORAL | Status: DC
  Administered 2017-07-31: 10 mg via ORAL
  Filled 2017-07-31 (×2): qty 1

## 2017-07-31 MED ORDER — ALLOPURINOL 100 MG PO TABS
100.0000 mg | ORAL_TABLET | Freq: Every day | ORAL | Status: DC
  Administered 2017-07-31: 100 mg via ORAL
  Filled 2017-07-31 (×2): qty 1

## 2017-07-31 MED ORDER — THROMBIN 5000 UNITS EX SOLR
CUTANEOUS | Status: DC | PRN
  Administered 2017-07-31: 10000 [IU] via TOPICAL

## 2017-07-31 MED ORDER — LIDOCAINE 2% (20 MG/ML) 5 ML SYRINGE
INTRAMUSCULAR | Status: AC
  Filled 2017-07-31: qty 5

## 2017-07-31 MED ORDER — 0.9 % SODIUM CHLORIDE (POUR BTL) OPTIME
TOPICAL | Status: DC | PRN
  Administered 2017-07-31: 1000 mL

## 2017-07-31 MED ORDER — POLYVINYL ALCOHOL 1.4 % OP SOLN: 1.0000 [drp] | OPHTHALMIC | Status: DC | PRN

## 2017-07-31 MED ORDER — NEOSTIGMINE METHYLSULFATE 5 MG/5ML IV SOSY
PREFILLED_SYRINGE | INTRAVENOUS | Status: AC
  Filled 2017-07-31: qty 5

## 2017-07-31 MED ORDER — ONDANSETRON HCL 4 MG PO TABS
4.0000 mg | ORAL_TABLET | Freq: Four times a day (QID) | ORAL | Status: DC | PRN
Start: 2017-07-31 — End: 2017-08-01

## 2017-07-31 MED ORDER — PHENYLEPHRINE HCL 10 MG/ML IJ SOLN
INTRAVENOUS | Status: DC | PRN
  Administered 2017-07-31: 25 ug/min via INTRAVENOUS

## 2017-07-31 MED ORDER — KETOROLAC TROMETHAMINE 30 MG/ML IJ SOLN
30.0000 mg | Freq: Once | INTRAMUSCULAR | Status: AC
  Administered 2017-07-31: 30 mg via INTRAVENOUS

## 2017-07-31 MED ORDER — BUPIVACAINE HCL (PF) 0.25 % IJ SOLN
INTRAMUSCULAR | Status: AC
  Filled 2017-07-31: qty 30

## 2017-07-31 MED ORDER — GABAPENTIN 300 MG PO CAPS
600.0000 mg | ORAL_CAPSULE | Freq: Every day | ORAL | Status: DC
  Administered 2017-07-31: 600 mg via ORAL
  Filled 2017-07-31: qty 2

## 2017-07-31 MED ORDER — ACETAMINOPHEN 325 MG PO TABS: 650.0000 mg | ORAL_TABLET | Freq: Four times a day (QID) | ORAL | Status: DC | PRN

## 2017-07-31 MED ORDER — SODIUM CHLORIDE 0.9 % IV SOLN: 250.0000 mL | INTRAVENOUS | Status: DC

## 2017-07-31 MED ORDER — ROCURONIUM BROMIDE 10 MG/ML (PF) SYRINGE
PREFILLED_SYRINGE | INTRAVENOUS | Status: AC
  Filled 2017-07-31: qty 5

## 2017-07-31 MED ORDER — ACETAMINOPHEN 650 MG RE SUPP: 650.0000 mg | RECTAL | Status: DC | PRN

## 2017-07-31 MED ORDER — KETOROLAC TROMETHAMINE 15 MG/ML IJ SOLN: 13.0000 mg | Freq: Four times a day (QID) | INTRAMUSCULAR | Status: DC

## 2017-07-31 MED ORDER — OXYCODONE HCL 5 MG PO TABS
10.0000 mg | ORAL_TABLET | ORAL | Status: DC | PRN
  Administered 2017-07-31 – 2017-08-01 (×5): 10 mg via ORAL
  Filled 2017-07-31 (×4): qty 2

## 2017-07-31 MED ORDER — PROPOFOL 10 MG/ML IV BOLUS
INTRAVENOUS | Status: AC
  Filled 2017-07-31: qty 20

## 2017-07-31 MED ORDER — CINACALCET HCL 30 MG PO TABS
90.0000 mg | ORAL_TABLET | Freq: Two times a day (BID) | ORAL | Status: DC
  Administered 2017-07-31: 90 mg via ORAL
  Filled 2017-07-31 (×3): qty 3

## 2017-07-31 MED ORDER — METHIMAZOLE 5 MG PO TABS
5.0000 mg | ORAL_TABLET | Freq: Every day | ORAL | Status: DC
Start: 2017-07-31 — End: 2017-08-01
  Administered 2017-07-31: 5 mg via ORAL
  Filled 2017-07-31 (×2): qty 1

## 2017-07-31 MED ORDER — LIDOCAINE HCL (CARDIAC) 20 MG/ML IV SOLN
INTRAVENOUS | Status: DC | PRN
  Administered 2017-07-31: 100 mg via INTRAVENOUS

## 2017-07-31 MED ORDER — KETOROLAC TROMETHAMINE 15 MG/ML IJ SOLN
15.0000 mg | Freq: Four times a day (QID) | INTRAMUSCULAR | Status: AC
  Administered 2017-07-31 – 2017-08-01 (×3): 15 mg via INTRAVENOUS
  Filled 2017-07-31 (×3): qty 1

## 2017-07-31 MED ORDER — KETOROLAC TROMETHAMINE 30 MG/ML IJ SOLN
INTRAMUSCULAR | Status: AC
  Filled 2017-07-31: qty 1

## 2017-07-31 MED ORDER — OXYCODONE HCL 5 MG PO TABS
ORAL_TABLET | ORAL | Status: AC
  Administered 2017-07-31: 10 mg via ORAL
  Filled 2017-07-31: qty 2

## 2017-07-31 MED ORDER — ACETAMINOPHEN 325 MG PO TABS
650.0000 mg | ORAL_TABLET | ORAL | Status: DC | PRN
Start: 2017-07-31 — End: 2017-08-01

## 2017-07-31 MED ORDER — MIDODRINE HCL 5 MG PO TABS
10.0000 mg | ORAL_TABLET | ORAL | Status: DC
  Administered 2017-08-01: 10 mg via ORAL
  Filled 2017-07-31 (×3): qty 2

## 2017-07-31 MED ORDER — HYDROMORPHONE HCL 1 MG/ML IJ SOLN: 1.0000 mg | INTRAMUSCULAR | Status: DC | PRN

## 2017-07-31 SURGICAL SUPPLY — 48 items
ADH SKN CLS APL DERMABOND .7 (GAUZE/BANDAGES/DRESSINGS) ×1
BAG DECANTER FOR FLEXI CONT (MISCELLANEOUS) ×3 IMPLANT
BENZOIN TINCTURE PRP APPL 2/3 (GAUZE/BANDAGES/DRESSINGS) ×3 IMPLANT
BLADE CLIPPER SURG (BLADE) IMPLANT
BUR CUTTER 7.0 ROUND (BURR) ×3 IMPLANT
CANISTER SUCT 3000ML PPV (MISCELLANEOUS) ×3 IMPLANT
CARTRIDGE OIL MAESTRO DRILL (MISCELLANEOUS) ×1 IMPLANT
CLOSURE WOUND 1/2 X4 (GAUZE/BANDAGES/DRESSINGS) ×1
DECANTER SPIKE VIAL GLASS SM (MISCELLANEOUS) ×3 IMPLANT
DERMABOND ADVANCED (GAUZE/BANDAGES/DRESSINGS) ×2
DERMABOND ADVANCED .7 DNX12 (GAUZE/BANDAGES/DRESSINGS) ×1 IMPLANT
DIFFUSER DRILL AIR PNEUMATIC (MISCELLANEOUS) ×3 IMPLANT
DRAPE HALF SHEET 40X57 (DRAPES) IMPLANT
DRAPE LAPAROTOMY 100X72X124 (DRAPES) ×3 IMPLANT
DRAPE MICROSCOPE LEICA (MISCELLANEOUS) IMPLANT
DRAPE POUCH INSTRU U-SHP 10X18 (DRAPES) ×3 IMPLANT
DRAPE SURG 17X23 STRL (DRAPES) ×6 IMPLANT
DRSG OPSITE POSTOP 4X6 (GAUZE/BANDAGES/DRESSINGS) ×3 IMPLANT
DURAPREP 26ML APPLICATOR (WOUND CARE) ×3 IMPLANT
ELECT REM PT RETURN 9FT ADLT (ELECTROSURGICAL) ×3
ELECTRODE REM PT RTRN 9FT ADLT (ELECTROSURGICAL) ×1 IMPLANT
GAUZE SPONGE 4X4 12PLY STRL (GAUZE/BANDAGES/DRESSINGS) ×3 IMPLANT
GAUZE SPONGE 4X4 16PLY XRAY LF (GAUZE/BANDAGES/DRESSINGS) ×3 IMPLANT
GLOVE ECLIPSE 9.0 STRL (GLOVE) ×3 IMPLANT
GLOVE EXAM NITRILE LRG STRL (GLOVE) IMPLANT
GLOVE EXAM NITRILE XL STR (GLOVE) IMPLANT
GLOVE EXAM NITRILE XS STR PU (GLOVE) IMPLANT
GOWN STRL REUS W/ TWL LRG LVL3 (GOWN DISPOSABLE) IMPLANT
GOWN STRL REUS W/ TWL XL LVL3 (GOWN DISPOSABLE) ×2 IMPLANT
GOWN STRL REUS W/TWL 2XL LVL3 (GOWN DISPOSABLE) IMPLANT
GOWN STRL REUS W/TWL LRG LVL3 (GOWN DISPOSABLE)
GOWN STRL REUS W/TWL XL LVL3 (GOWN DISPOSABLE) ×6
KIT BASIN OR (CUSTOM PROCEDURE TRAY) ×3 IMPLANT
KIT ROOM TURNOVER OR (KITS) ×3 IMPLANT
NEEDLE HYPO 22GX1.5 SAFETY (NEEDLE) ×3 IMPLANT
NEEDLE SPNL 22GX3.5 QUINCKE BK (NEEDLE) ×3 IMPLANT
NS IRRIG 1000ML POUR BTL (IV SOLUTION) ×3 IMPLANT
OIL CARTRIDGE MAESTRO DRILL (MISCELLANEOUS) ×3
PACK LAMINECTOMY NEURO (CUSTOM PROCEDURE TRAY) ×3 IMPLANT
PAD ARMBOARD 7.5X6 YLW CONV (MISCELLANEOUS) ×9 IMPLANT
RUBBERBAND STERILE (MISCELLANEOUS) IMPLANT
SPONGE SURGIFOAM ABS GEL SZ50 (HEMOSTASIS) ×3 IMPLANT
STRIP CLOSURE SKIN 1/2X4 (GAUZE/BANDAGES/DRESSINGS) ×2 IMPLANT
SUT VIC AB 2-0 CT1 18 (SUTURE) ×3 IMPLANT
SUT VIC AB 3-0 SH 8-18 (SUTURE) ×3 IMPLANT
TOWEL GREEN STERILE (TOWEL DISPOSABLE) ×3 IMPLANT
TOWEL GREEN STERILE FF (TOWEL DISPOSABLE) ×3 IMPLANT
WATER STERILE IRR 1000ML POUR (IV SOLUTION) ×3 IMPLANT

## 2017-07-31 NOTE — Brief Op Note (Signed)
07/31/2017  9:30 AM  PATIENT:  [name]  66 y.o. male  PRE-OPERATIVE DIAGNOSIS:  LUMBAR STENOSIS WITH NEUROGENIC CLAUDICATION  POST-OPERATIVE DIAGNOSIS:  LUMBAR STENOSIS WITH NEUROGENIC CLAUDICATION  PROCEDURE:  Procedure(s) with comments: LAMINECTOMY AND FORAMINOTOMY- BILATERAL LUMBAR TWO- LUMBAR THREE (Bilateral) - LAMINECTOMY AND FORAMINOTOMY- BILATERAL LUMBAR 2- LUMBAR 3  SURGEON:  Surgeon(s) and Role:    * Earnie Larsson, MD - Primary    * Kary Kos, MD - Assisting  PHYSICIAN ASSISTANT:   ASSISTANTS:    ANESTHESIA:   general  EBL:  200 mL   BLOOD ADMINISTERED:none  DRAINS: none   LOCAL MEDICATIONS USED:  MARCAINE     SPECIMEN:  No Specimen  DISPOSITION OF SPECIMEN:  N/A  COUNTS:  YES  TOURNIQUET:  * No tourniquets in log *  DICTATION: .Dragon Dictation  PLAN OF CARE: Admit for overnight observation  PATIENT DISPOSITION:  PACU - hemodynamically stable.   Delay start of Pharmacological VTE agent (>24hrs) due to surgical blood loss or risk of bleeding: yes

## 2017-07-31 NOTE — Progress Notes (Signed)
PT Cancellation Note  Patient Details Name: [name] MRN: [id number] DOB: 10-20-1950   Cancelled Treatment:    Reason Eval/Treat Not Completed: Patient declined, no reason specified Pt declined stating he did not feel like getting up. Educated about importance of mobility, however, pt continued to decline.   Leighton Ruff, PT, DPT  Acute Rehabilitation Services  Pager: [phone]  [name] 07/31/2017, 4:33 PM

## 2017-07-31 NOTE — Progress Notes (Signed)
PT Cancellation Note  Patient Details Name: [name] MRN: [id number] DOB: 05/20/1951   Cancelled Treatment:    Reason Eval/Treat Not Completed: Patient declined, no reason specified Pt reporting he is too tired to walk and was not getting up right now despite education about importance of mobility. Will follow up as schedule allows.   Leighton Ruff, PT, DPT  Acute Rehabilitation Services  Pager: [phone]    [name] 07/31/2017, 2:22 PM

## 2017-07-31 NOTE — H&P (Signed)
[name] is an 66 y.o. male.   Chief Complaint: Weakness HPI: 66 year old male status post previous L3-4 and L4-5 lumbar fusion surgery presents with progressive bilateral lower extremity weakness consistent with neurogenic claudication.  Workup demonstrates evidence of severe stenosis at L2-3 without evidence of instability.  Patient in poor overall health and not a great candidate for more extensive fusion surgery.  Plan for simple L2-3 decompressive surgery in hopes of improving his symptoms.  Past Medical History:  Diagnosis Date  . Allergic rhinitis, cause unspecified 02/24/2014  . Anemia 06/16/2011  . BENIGN PROSTATIC HYPERTROPHY 10/14/2009  . CAD, NATIVE VESSEL 02/06/2009   a. 06/2007 s/p Taxus DES to the RCA;  b. 08/2016 NSTEMI in setting of SVT/PCI: LM 30ost, LAD 89m (3.0x16 Synergy DES), LCX 39m, OM1 60, RI 40, RCA 70p/m, 5m - not amenable to PCI.  [name] Cervical radiculopathy, chronic 02/23/2016   Right c5-6 by NCS/EMG  . CHEST PAIN 03/29/2010  . Chronic combined systolic (congestive) and diastolic (congestive) heart failure ([place])    a. 10/2016 Echo: EF 40-45%, Gr2 DD. mildly dil LA.  [name] COLONIC POLYPS, HX OF 10/14/2009  . Dementia [id number]  . DEPRESSION 10/14/2009  . Depression 09/24/2015  . DIABETES MELLITUS, TYPE II 02/01/2010  . DIZZINESS 07/17/2010  . DYSLIPIDEMIA 06/18/2007  . ESRD (end stage renal disease) on dialysis ([name]) 08/04/2010   "TTS;  " (04/18/2015)  . FOOT PAIN 08/12/2008  . GAIT DISTURBANCE 03/03/2010  . GASTROENTERITIS, VIRAL 10/14/2009  . GERD 06/18/2007  . GOITER, MULTINODULAR 12/26/2007  . GOUT 06/18/2007  . GYNECOMASTIA 07/17/2010  . Hemodialysis access, fistula mature [facility])    Dialysis T-Th-Sa ([place]) Right upper arm fistula  . Hyperlipidemia 10/16/2011  . Hyperparathyroidism, secondary ([place]) 06/16/2011  . HYPERTENSION 06/18/2007  . Hyperthyroidism   . Hypocalcemia 06/07/2010  . Ischemic cardiomyopathy    a. 10/2016 Echo: EF 40-45%.  . Lumbar  stenosis with neurogenic claudication   . NECK PAIN 07/31/2010  . ONYCHOMYCOSIS, TOENAILS 12/26/2007  . OSA on CPAP 10/16/2011  . Other malaise and fatigue 11/24/2009  . PERIPHERAL NEUROPATHY 06/18/2007  . Prostate cancer ([name])   . PSVT (paroxysmal supraventricular tachycardia) ([place])    a. 23/7628 complicated by NSTEMI;  b. 11/2016 Treated w/ adenosine in ED;  c. 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  [name] PULMONARY NODULE, RIGHT LOWER LOBE 06/08/2009  . Sleep apnea    cpap machine and o2  . TRANSAMINASES, SERUM, ELEVATED 02/01/2010  . Transfusion history    none recent  . Unspecified hypotension 01/30/2010    Past Surgical History:  Procedure Laterality Date  . ARTERIOVENOUS GRAFT PLACEMENT Right 2009   forearm/notes 02/01/2011  . AV FISTULA PLACEMENT  11/07/2011   Procedure: INSERTION OF ARTERIOVENOUS (AV) GORE-TEX GRAFT ARM;  Surgeon: Tinnie Gens, MD;  Location: [place];  Service: Vascular;  Laterality: Left;  . BACK SURGERY  1998  . BASCILIC VEIN TRANSPOSITION Right 02/27/2013   Procedure: BASCILIC VEIN TRANSPOSITION;  Surgeon: Mal Misty, MD;  Location: [place];  Service: Vascular;  Laterality: Right;  Right Basilic Vein Transposition   . CARDIAC CATHETERIZATION N/A 08/06/2016   Procedure: Left Heart Cath and Coronary Angiography;  Surgeon: Jolaine Artist, MD;  Location: [place] CV LAB;  Service: Cardiovascular;  Laterality: N/A;  . CARDIAC CATHETERIZATION N/A 08/07/2016   Procedure: Coronary/Graft Atherectomy-CSI LAD;  Surgeon: Peter M Martinique, MD;  Location: [place] CV LAB;  Service: Cardiovascular;  Laterality: N/A;  . CERVICAL SPINE SURGERY  2/09   "to repair nerve problems in my left arm"  . CHOLECYSTECTOMY    . COLONOSCOPY WITH PROPOFOL N/A 04/26/2017   Procedure: COLONOSCOPY WITH PROPOFOL;  Surgeon: Otis Brace, MD;  Location: [place];  Service: Gastroenterology;  Laterality: N/A;  . CORONARY ANGIOPLASTY WITH STENT PLACEMENT  06/11/2008  . CORONARY ANGIOPLASTY WITH STENT  PLACEMENT  06/2007   TAXUS stent to RCA/notes 01/31/2011  . ESOPHAGOGASTRODUODENOSCOPY  09/28/2011   Procedure: ESOPHAGOGASTRODUODENOSCOPY (EGD);  Surgeon: Missy Sabins, MD;  Location: [facility] ENDOSCOPY;  Service: Endoscopy;  Laterality: N/A;  . ESOPHAGOGASTRODUODENOSCOPY N/A 04/07/2015   Procedure: ESOPHAGOGASTRODUODENOSCOPY (EGD);  Surgeon: Teena Irani, MD;  Location: [facility];  Service: Endoscopy;  Laterality: N/A;  . ESOPHAGOGASTRODUODENOSCOPY N/A 04/19/2015   Procedure: ESOPHAGOGASTRODUODENOSCOPY (EGD);  Surgeon: Arta Silence, MD;  Location[facility] ENDOSCOPY;  Service: Endoscopy;  Laterality: N/A;  . FLEXIBLE SIGMOIDOSCOPY N/A 05/21/2017   Procedure: Conni Elliot;  Surgeon: Clarene Essex, MD;  Location: [place];  Service: Endoscopy;  Laterality: N/A;  . FLEXIBLE SIGMOIDOSCOPY Left 07/02/2017   Procedure: FLEXIBLE SIGMOIDOSCOPY;  Surgeon: Laurence Spates, MD;  Location: [place];  Service: Endoscopy;  Laterality: Left;  . FOREIGN BODY REMOVAL  09/2003   via upper endoscopy/notes 02/12/2011  . GIVENS CAPSULE STUDY  09/30/2011   Procedure: GIVENS CAPSULE STUDY;  Surgeon: Jeryl Columbia, MD;  Location: [facility] ENDOSCOPY;  Service: Endoscopy;  Laterality: N/A;  . INSERTION OF DIALYSIS CATHETER Right 2014  . INSERTION OF DIALYSIS CATHETER Left 02/11/2013   Procedure: INSERTION OF DIALYSIS CATHETER;  Surgeon: Conrad Mexia, MD;  Location: [place];  Service: Vascular;  Laterality: Left;  Ultrasound guided  . REMOVAL OF A DIALYSIS CATHETER Right 02/11/2013   Procedure: REMOVAL OF A DIALYSIS CATHETER;  Surgeon: Conrad Ruskin, MD;  Location: [place];  Service: Vascular;  Laterality: Right;  . SAVORY DILATION N/A 04/07/2015   Procedure: SAVORY DILATION;  Surgeon: Teena Irani, MD;  Location: [facility] ENDOSCOPY;  Service: Endoscopy;  Laterality: N/A;  . SHUNTOGRAM N/A 09/20/2011   Procedure: Earney Mallet;  Surgeon: Conrad Home, MD;  Location: [facility] CATH LAB;  Service: Cardiovascular;  Laterality: N/A;  . SVT ABLATION  N/A 11/26/2016   Procedure: SVT Ablation;  Surgeon: Will Meredith Leeds, MD;  Location: [place] CV LAB;  Service: Cardiovascular;  Laterality: N/A;  . TONSILLECTOMY    . TOTAL KNEE ARTHROPLASTY Right 08/02/2015   Procedure: TOTAL KNEE ARTHROPLASTY;  Surgeon: Renette Butters, MD;  Location: [place];  Service: Orthopedics;  Laterality: Right;  . VENOGRAM N/A 01/26/2013   Procedure: VENOGRAM;  Surgeon: Angelia Mould, MD;  Location: [facility] CATH LAB;  Service: Cardiovascular;  Laterality: N/A;    Family History  Problem Relation Age of Onset  . Heart disease Sister   . Thyroid nodules Sister   . Heart disease Father   . Diabetes Father   . Kidney failure Father   . Hypertension Father   . Healthy Child   . Healthy Child   . Healthy Child   . Cancer Neg Hx    Social History:  reports that he quit smoking about 11 years ago. His smoking use included Cigarettes. He has a 25.00 pack-year smoking history. He quit smokeless tobacco use about 11 years ago. He reports that he does not drink alcohol or use drugs.  Allergies:  Allergies  Allergen Reactions  . Cephalexin Swelling and Other (See Comments)    Tongue swelling  . Statins Other (See Comments)    Weak muscles  .  Ciprofloxacin Rash    Medications Prior to Admission  Medication Sig Dispense Refill  . acetaminophen (TYLENOL) 325 MG tablet Take 650 mg by mouth every 6 (six) hours as needed for mild pain or headache.     . allopurinol (ZYLOPRIM) 100 MG tablet Take 1 tablet (100 mg total) by mouth daily. (Patient taking differently: Take 100 mg by mouth at bedtime. ) 90 tablet 3  . aspirin EC 81 MG tablet Take 81 mg by mouth daily.    . colesevelam (WELCHOL) 625 MG tablet TAKE THREE TABLETS BY MOUTH TWICE DAILY WITH MEALS (Patient taking differently: Take 1,875 mg by mouth 2 (two) times daily with a meal. ) 180 tablet 11  . ezetimibe (ZETIA) 10 MG tablet TAKE ONE TABLET BY MOUTH ONCE DAILY (Patient taking differently: TAKE 1 TABLET  (10 MG) BY MOUTH ONCE DAILY AT BEDTIME.) 90 tablet 3  . gabapentin (NEURONTIN) 300 MG capsule Take 1 capsule (300 mg total) by mouth at bedtime. (Patient taking differently: Take 600 mg by mouth at bedtime. )    . lanthanum (FOSRENOL) 1000 MG chewable tablet Chew 1,000 mg by mouth 2 (two) times daily.    . mesalamine (CANASA) 1000 MG suppository Place 0.5 suppositories (500 mg total) rectally at bedtime. (Patient taking differently: Place 500 mg rectally at bedtime as needed (for flare up). ) 30 suppository 2  . methimazole (TAPAZOLE) 5 MG tablet Take 1 tablet (5 mg total) by mouth daily. 30 tablet 11  . metoprolol tartrate (LOPRESSOR) 25 MG tablet Take 1 tablet (25 mg total) by mouth 2 (two) times daily. ON NON-HD DAYS (Patient taking differently: Take 25 mg by mouth 2 (two) times daily. ON NON-HD DAYS (Sunday, Monday, Wednesday, and Fridays ONLY)) 144 tablet 3  . midodrine (PROAMATINE) 10 MG tablet Take 1 tablet (10 mg total) by mouth See admin instructions. Once a day only on dialysis days (Tues/Thurs/Sat) (Patient taking differently: Take 10 mg by mouth Every Tuesday,Thursday,and Saturday with dialysis. Once a day only on dialysis days (Tues/Thurs/Sat)) 30 tablet 0  . multivitamin (RENA-VIT) TABS tablet Take 1 tablet by mouth daily. Reported on 10/24/2015    . oxyCODONE (OXY IR/ROXICODONE) 5 MG immediate release tablet Take 5 mg by mouth every 6 (six) hours as needed. For pain.  0  . pantoprazole (PROTONIX) 40 MG tablet Take 40 mg by mouth every evening.     . polyethylene glycol (MIRALAX / GLYCOLAX) packet Take 17 g by mouth daily. (Patient taking differently: Take 17 g by mouth daily as needed for mild constipation. ) 14 each 0  . SENSIPAR 90 MG tablet Take 90 mg by mouth 2 (two) times daily.    . citalopram (CELEXA) 10 MG tablet Take 10 mg by mouth at bedtime.     . hydroxypropyl methylcellulose / hypromellose (ISOPTO TEARS / GONIOVISC) 2.5 % ophthalmic solution Place 1 drop into both eyes 3  (three) times daily as needed for dry eyes. 15 mL 11    Results for orders placed or performed during the hospital encounter of 07/31/17 (from the past 48 hour(s))  I-STAT 4, (NA,K, GLUC, HGB,HCT)     Status: Abnormal   Collection Time: 07/31/17  6:52 AM  Result Value Ref Range   Sodium 141 135 - 145 mmol/L   Potassium 4.2 3.5 - 5.1 mmol/L   Glucose, Bld 131 (H) 65 - 99 mg/dL   HCT 38.0 (L) 39.0 - 52.0 %   Hemoglobin 12.9 (L) 13.0 - 17.0 g/dL   *  Note: Due to a large number of results and/or encounters for the requested time period, some results have not been displayed. A complete set of results can be found in Results Review.   No results found.  Pertinent items noted in HPI and remainder of comprehensive ROS otherwise negative.  Blood pressure 139/70, pulse 80, temperature 98.4 F (36.9 C), temperature source Oral, resp. rate 18, weight 100.2 kg (221 lb), SpO2 97 %.  Patient is awake and alert.  He is oriented and appropriate.  His cranial nerve function is intact.  His speech is fluent.  His judgment and insight are intact.  Cranial nerve function normal bilateral.  Motor exam of his upper extremities normal.  Motor examination of the lower extremities reveals weakness of both quadriceps muscle groups grading at 4/5.  His weakness of dorsiflexion grading at 4-/5.  He has weakness of plantar flexion at 4/5.  Gait is abnormal.  Posture is flexed.  Examination of the sensory function reveals a relative sensory level at L3.  Deep tendon reflexes are absent in both lower extremities.  No evidence for long track signs.  Examination of the head ears eyes nose and throat is unremarkable.  Chest and abdomen are benign.  Extremities are free of major deformity. Assessment/Plan L2-3 stenosis with neurogenic claudication.  Plan L2-3 decompressive laminectomy and foraminotomies in hopes of improving his symptoms.  Risks and benefits of been explained.  Patient wishes to proceed.  Jalea Bronaugh  A 07/31/2017, 7:38 AM

## 2017-07-31 NOTE — Op Note (Signed)
Date of procedure: 07/31/2017  Date of dictation: Same  Service: Neurosurgery  Preoperative diagnosis: L2-3 stenosis with neurogenic claudication  Postoperative diagnosis: Same  Procedure Name: Bilateral L2-3 decompressive laminotomies and foraminotomies  Surgeon:Deanette Tullius A.Shye Doty, M.D.  Asst. Surgeon: Saintclair Halsted  Anesthesia: General  Indication: 66 year old male status post prior L3-4 and L4-5 fusions presents now with back and bilateral lower extremity pain and weakness.  Workup demonstrates evidence of critical multifactorial stenosis at L2-3 without evidence of obvious instability.  Patient presents now for simple decompressive surgery in hopes of improving his symptoms.  Operative note: After induction of anesthesia, patient was positioned prone on the Wilson frame and appropriately padded.  Lumbar region prepped and draped sterilely.  Incision made overlying L2-3.  Dissection performed bilaterally.  Retractor placed.  X-ray taken.  Level confirmed.  Laminotomy was performed bilaterally using high-speed drill and Kerrison rongeurs to remove the inferior aspect of the lamina of L2 the medial aspect of the L2-3 facet joint and the superior limb of the L3 lamina.  With an inflamed elevated and resected.  Decompressive foraminotomies completed on the course of the exiting L2 and L3 nerve roots bilaterally.  At this point a very thorough decompression had been achieved.  There was no evidence of injury to thecal sac or nerve roots.  Wound was then irrigated by solution.  Gelfoam was placed topically for hemostasis.  Wounds and closed in layers with Vicryl sutures.  Steri-Strips and sterile dressing were applied.  No apparent complications.  Patient tolerated the procedure well and he returns to the recovery room postoperative

## 2017-07-31 NOTE — Progress Notes (Signed)
ANTIBIOTIC CONSULT NOTE - INITIAL  Pharmacy Consult for Vancomycin Indication: surgical prophylaxis  Allergies  Allergen Reactions  . Cephalexin Swelling and Other (See Comments)    Tongue swelling  . Statins Other (See Comments)    Weak muscles  . Ciprofloxacin Rash    Patient Measurements: Height: 6\' 1"  (185.4 cm) Weight: 221 lb (100.2 kg) IBW/kg (Calculated) : 79.9    Assessment:   S/p lumbar surgery.  PCN allergy.  Vancomycin 1500 mg IV x 1 given pre-op at 7:40 am.   Request received for 1 dose of Vancomycin 12 hrs post-op.  No drain.     ESRD - usual HD TTS.  Next planned 11/1.  Goal of Therapy:  Vancomycin trough level 10-15 mcg/ml  Plan:   No post-op dose of Vancomycin needed.  Pre-op dose will provide >24 hrs post-op coverage.  Pharmacy signing off.   Labs:  Recent Labs  07/31/17 0652  HGB 12.9*    Scr 8.02 - ESRD  Microbiology: Recent Results (from the past 720 hour(s))  MRSA PCR Screening     Status: None   Collection Time: 07/03/17  8:37 AM  Result Value Ref Range Status   MRSA by PCR NEGATIVE NEGATIVE Final    Comment:        The GeneXpert MRSA Assay (FDA approved for NASAL specimens only), is one component of a comprehensive MRSA colonization surveillance program. It is not intended to diagnose MRSA infection nor to guide or monitor treatment for MRSA infections.   Surgical pcr screen     Status: None   Collection Time: 07/26/17  1:30 PM  Result Value Ref Range Status   MRSA, PCR NEGATIVE NEGATIVE Final   Staphylococcus aureus NEGATIVE NEGATIVE Final    Comment: (NOTE) The Xpert SA Assay (FDA approved for NASAL specimens in patients 32 years of age and older), is one component of a comprehensive surveillance program. It is not intended to diagnose infection nor to guide or monitor treatment.     Arty Baumgartner, [place] Pager: [phone] 07/31/2017,2:26 PM

## 2017-07-31 NOTE — Anesthesia Procedure Notes (Signed)
Procedure Name: Intubation Date/Time: 07/31/2017 8:05 AM Performed by: Rush Farmer E Pre-anesthesia Checklist: Patient identified, Emergency Drugs available, Suction available and Patient being monitored Patient Re-evaluated:Patient Re-evaluated prior to induction Oxygen Delivery Method: Circle System Utilized Preoxygenation: Pre-oxygenation with 100% oxygen Induction Type: IV induction Ventilation: Mask ventilation without difficulty and Oral airway inserted - appropriate to patient size Laryngoscope Size: Mac and 4 Grade View: Grade III Tube type: Oral Tube size: 7.5 mm Number of attempts: 1 Airway Equipment and Method: Stylet and Oral airway Placement Confirmation: ETT inserted through vocal cords under direct vision,  positive ETCO2 and breath sounds checked- equal and bilateral Secured at: 23 cm Tube secured with: Tape Dental Injury: Teeth and Oropharynx as per pre-operative assessment

## 2017-07-31 NOTE — Transfer of Care (Signed)
Immediate Anesthesia Transfer of Care Note  Patient: [name]  Procedure(s) Performed: LAMINECTOMY AND FORAMINOTOMY- BILATERAL LUMBAR TWO- LUMBAR THREE (Bilateral Back)  Patient Location: PACU  Anesthesia Type:General  Level of Consciousness: awake, alert  and oriented  Airway & Oxygen Therapy: Patient Spontanous Breathing and Patient connected to nasal cannula oxygen  Post-op Assessment: Report given to RN, Post -op Vital signs reviewed and stable and Patient moving all extremities X 4  Post vital signs: Reviewed and stable  Last Vitals:  Vitals:   07/31/17 0626  BP: 139/70  Pulse: 80  Resp: 18  Temp: 36.9 C  SpO2: 97%    Last Pain:  Vitals:   07/31/17 0626  TempSrc: Oral         Complications: No apparent anesthesia complications

## 2017-08-01 ENCOUNTER — Encounter (HOSPITAL_COMMUNITY): Payer: Self-pay | Admitting: Neurosurgery

## 2017-08-01 DIAGNOSIS — M48062 Spinal stenosis, lumbar region with neurogenic claudication: Secondary | ICD-10-CM | POA: Diagnosis not present

## 2017-08-01 DIAGNOSIS — Z992 Dependence on renal dialysis: Secondary | ICD-10-CM | POA: Diagnosis not present

## 2017-08-01 DIAGNOSIS — D631 Anemia in chronic kidney disease: Secondary | ICD-10-CM | POA: Diagnosis not present

## 2017-08-01 DIAGNOSIS — N2581 Secondary hyperparathyroidism of renal origin: Secondary | ICD-10-CM | POA: Diagnosis not present

## 2017-08-01 DIAGNOSIS — I120 Hypertensive chronic kidney disease with stage 5 chronic kidney disease or end stage renal disease: Secondary | ICD-10-CM | POA: Diagnosis not present

## 2017-08-01 DIAGNOSIS — I5042 Chronic combined systolic (congestive) and diastolic (congestive) heart failure: Secondary | ICD-10-CM | POA: Diagnosis not present

## 2017-08-01 DIAGNOSIS — E1122 Type 2 diabetes mellitus with diabetic chronic kidney disease: Secondary | ICD-10-CM | POA: Diagnosis not present

## 2017-08-01 DIAGNOSIS — N186 End stage renal disease: Secondary | ICD-10-CM | POA: Diagnosis not present

## 2017-08-01 DIAGNOSIS — I132 Hypertensive heart and chronic kidney disease with heart failure and with stage 5 chronic kidney disease, or end stage renal disease: Secondary | ICD-10-CM | POA: Diagnosis not present

## 2017-08-01 DIAGNOSIS — E1129 Type 2 diabetes mellitus with other diabetic kidney complication: Secondary | ICD-10-CM | POA: Diagnosis not present

## 2017-08-01 MED ORDER — CYCLOBENZAPRINE HCL 10 MG PO TABS: 10.0000 mg | ORAL_TABLET | Freq: Three times a day (TID) | ORAL | 0 refills | Status: DC | PRN

## 2017-08-01 MED ORDER — HEPARIN SODIUM (PORCINE) 1000 UNIT/ML DIALYSIS: 1000.0000 [IU] | INTRAMUSCULAR | Status: DC | PRN

## 2017-08-01 MED ORDER — PENTAFLUOROPROP-TETRAFLUOROETH EX AERO: 1.0000 "application " | INHALATION_SPRAY | CUTANEOUS | Status: DC | PRN

## 2017-08-01 MED ORDER — ALTEPLASE 2 MG IJ SOLR: 2.0000 mg | Freq: Once | INTRAMUSCULAR | Status: DC | PRN

## 2017-08-01 MED ORDER — LIDOCAINE HCL (PF) 1 % IJ SOLN: 5.0000 mL | INTRAMUSCULAR | Status: DC | PRN

## 2017-08-01 MED ORDER — SODIUM CHLORIDE 0.9 % IV SOLN: 100.0000 mL | INTRAVENOUS | Status: DC | PRN

## 2017-08-01 MED ORDER — LIDOCAINE-PRILOCAINE 2.5-2.5 % EX CREA: 1.0000 "application " | TOPICAL_CREAM | CUTANEOUS | Status: DC | PRN

## 2017-08-01 NOTE — Progress Notes (Signed)
[name] is a 66 y.o. male with ESRD, HTN, CAD (s/p stents), Type 2 DM, ischemic cardiomyopathy (EF 40-45%), DDD with RLE radiculopathy, hyperthyroidism, OSA, Hx SVT, Hx prostate cancer, and Hx GI bleed.   He is admitted under observation status following spine surgery. Workup showing L2-3 stenosis with symptoms of back and lower extremity pain.  He underwent bilateral L2-3 decompressive laminotomies and foraminotomies 10/31 by Dr. Annette Stable. Consulted for maintainence dialysis during admission.  Labs: Na 140 K 4.2 Glucose 131 Hgb 12.9    Seen in HD unit this AM. Drowsy, wearing CPAP. Lungs CTAB, RRR, No LE edema Denies CP, SOB. Pain controlled.     Dialysis Orders: [facility] TThS 4h 180F 350/800 EDW 101 2K/2Ca Profile 1 Na Linear RUE AVF No heparin [name] 43mcg IV q HD Venofer 100mg  IV x5 (until 11/8) Mircera 58mcg IV q 2 weeks (last 10/25) BMM: Renvela 3 q ac   Assessment/Plan: 1. Back pain/LE weakness/pain - s/p L2-3 decompression 10/31-  per neurosurgery  2. ESRD - TTS. Continue usual schedule 3. Volume - below EDW by weights here, no volume on exam- BP soft/takes midodrine for IDHT - keep even remainder of treatment 4. Anemia - Hgb 12.9, follow trend, on OP ESA 5. MBD - Outpatient P at goal/ Cont VDRA/Renvela binder   [name] Splinter MD [facility] pgr [phone]   08/01/2017, 3:53 PM

## 2017-08-01 NOTE — Discharge Instructions (Signed)
Wound Care °Keep incision covered and dry for two days.  If you shower, cover incision with plastic wrap.  °Do not put any creams, lotions, or ointments on incision. °Leave steri-strips on back.  They will fall off by themselves. °Activity °Walk each and every day, increasing distance each day. °No lifting greater than 5 lbs.  Avoid excessive neck motion. °No driving for 2 weeks; may ride as a passenger locally. °If provided with back brace, wear when out of bed.  It is not necessary to wear brace in bed. °Diet °Resume your normal diet.  °Return to Work °Will be discussed at you follow up appointment. °Call Your Doctor If Any of These Occur °Redness, drainage, or swelling at the wound.  °Temperature greater than 101 degrees. °Severe pain not relieved by pain medication. °Incision starts to come apart. °Follow Up Appt °Call today for appointment in 1-2 weeks ([phone]) or for problems.  If you have any hardware placed in your spine, you will need an x-ray before your appointment. ° ° °

## 2017-08-01 NOTE — Procedures (Signed)
Doing well on HD, no c/o, on his CPAP mask.   I was present at this dialysis session, have reviewed the session itself and made  appropriate changes Kelly Splinter MD [name] pager [phone]   08/01/2017, 10:34 AM

## 2017-08-01 NOTE — Evaluation (Signed)
Physical Therapy Evaluation Patient Details Name: [name] MRN: [id number] DOB: [birth year] Today's Date: 08/01/2017   History of Present Illness  Pt is a 66 y/o male s/p L2-3 laminectomy and microdiskectomy. PMH includes HTN, ESRD on dialysis TTS, CAD, NSTEMI s/p stent placement, DM, GERD, R TKA, R AV graft placement, and back surgery.   Clinical Impression  Pt s/p surgery above with deficits below. PTA, pt was independent with use of walker. Upon eval, pt very fatigued with ambulation and only able to tolerate short ambulation distance. Required min to min guard assist for mobility. Pt had dialysis today and reports he usually does not do anything on dialysis days secondary to fatigue. Educated about importance of mobility. Reviewed safe stair navigation, however, pt refusing to perform at this time. Recommending HHPT at d/c to increase independence and safety with functional mobility. Will continue to follow acutely to maximize functional mobility independence and safety.     Follow Up Recommendations Home health PT;Supervision/Assistance - 24 hour    Equipment Recommendations  None recommended by PT (Has all necessary DME )    Recommendations for Other Services       Precautions / Restrictions Precautions Precautions: Back;Fall Precaution Booklet Issued: Yes (comment) Precaution Comments: Reviewed back precautions with pt.  Restrictions Weight Bearing Restrictions: No      Mobility  Bed Mobility               General bed mobility comments: Sitting EOB upon entry   Transfers Overall transfer level: Needs assistance Equipment used: Rolling walker (2 wheeled) Transfers: Sit to/from Stand Sit to Stand: Min guard         General transfer comment: Enxtended time to complete min guard for steadying assist.   Ambulation/Gait Ambulation/Gait assistance: Min assist Ambulation Distance (Feet): 50 Feet Assistive device: Rolling walker (2 wheeled) Gait  Pattern/deviations: Step-through pattern;Decreased stride length;Trunk flexed;Wide base of support Gait velocity: Decreased Gait velocity interpretation: Below normal speed for age/gender General Gait Details: Slow, unsteady gait. Extremely wide BOS and would step on RW wheels. Reports RW is much slimmer than his. Pt easily fatigued, however, reports he fatigues very easily after dialysis. Required continuous cues for upright posture. Required standing rest secondary to fatigue. Wanted to bend over for rest, however, required cues to maintain precautions.   Stairs Stairs:  (Refused stair training this session. )          Wheelchair Mobility    Modified Rankin (Stroke Patients Only)       Balance Overall balance assessment: Needs assistance Sitting-balance support: No upper extremity supported;Feet supported Sitting balance-Leahy Scale: Good     Standing balance support: Bilateral upper extremity supported;During functional activity Standing balance-Leahy Scale: Poor Standing balance comment: Reliant on RW for support                              Pertinent Vitals/Pain Pain Assessment: 0-10 Pain Score: 4  Pain Location: back  Pain Descriptors / Indicators: Aching;Operative site guarding Pain Intervention(s): Limited activity within patient's tolerance;Monitored during session;Repositioned    Home Living Family/patient expects to be discharged to:: Private residence Living Arrangements: Spouse/significant other Available Help at Discharge: Family;Available 24 hours/day Type of Home: House Home Access: Stairs to enter Entrance Stairs-Rails: Right Entrance Stairs-Number of Steps: 3 Home Layout: One level Home Equipment: Walker - 2 wheels;Cane - single point;Shower seat;Bedside commode;Walker - 4 wheels      Prior Function Level of Independence:  Independent with assistive device(s)         Comments: Used RW for ambulation. Reports he does not do alot on  his dialysis days and mostly sits in recliner.      Hand Dominance        Extremity/Trunk Assessment   Upper Extremity Assessment Upper Extremity Assessment: Generalized weakness    Lower Extremity Assessment Lower Extremity Assessment: Generalized weakness    Cervical / Trunk Assessment Cervical / Trunk Assessment: Other exceptions Cervical / Trunk Exceptions: s/p laminectomy and microdiskectomy   Communication   Communication: No difficulties  Cognition Arousal/Alertness: Awake/alert Behavior During Therapy: WFL for tasks assessed/performed Overall Cognitive Status: Within Functional Limits for tasks assessed                                        General Comments General comments (skin integrity, edema, etc.): Wife present during session. Educated about safe stair management at home as pt currently refusing. Educated about generalized walking program to perform at home and level of assist required at home.     Exercises     Assessment/Plan    PT Assessment Patient needs continued PT services  PT Problem List Decreased strength;Decreased activity tolerance;Decreased range of motion;Decreased balance;Decreased mobility;Decreased knowledge of use of DME;Decreased knowledge of precautions;Cardiopulmonary status limiting activity;Pain       PT Treatment Interventions DME instruction;Gait training;Functional mobility training;Stair training;Therapeutic activities;Balance training;Therapeutic exercise;Neuromuscular re-education;Patient/family education    PT Goals (Current goals can be found in the Care Plan section)  Acute Rehab PT Goals Patient Stated Goal: to get better to get a kidney transplant PT Goal Formulation: With patient Time For Goal Achievement: 08/08/17 Potential to Achieve Goals: Good    Frequency Min 5X/week   Barriers to discharge        Co-evaluation               AM-PAC PT "6 Clicks" Daily Activity  Outcome Measure  Difficulty turning over in bed (including adjusting bedclothes, sheets and blankets)?: A Little Difficulty moving from lying on back to sitting on the side of the bed? : A Little Difficulty sitting down on and standing up from a chair with arms (e.g., wheelchair, bedside commode, etc,.)?: Unable Help needed moving to and from a bed to chair (including a wheelchair)?: A Little Help needed walking in hospital room?: A Little Help needed climbing 3-5 steps with a railing? : A Lot 6 Click Score: 15    End of Session Equipment Utilized During Treatment: Gait belt Activity Tolerance: Patient limited by fatigue Patient left: in bed;with call bell/phone within reach;with family/visitor present Nurse Communication: Mobility status PT Visit Diagnosis: Other abnormalities of gait and mobility (R26.89);Pain;Muscle weakness (generalized) (M62.81);Unsteadiness on feet (R26.81) Pain - part of body:  (back )    Time: 1350-1410 PT Time Calculation (min) (ACUTE ONLY): 20 min   Charges:   PT Evaluation $PT Eval Low Complexity: 1 Low     PT G Codes:   PT G-Codes **NOT FOR INPATIENT CLASS** Functional Assessment Tool Used: AM-PAC 6 Clicks Basic Mobility;Clinical judgement Functional Limitation: Mobility: Walking and moving around Mobility: Walking and Moving Around Current Status (G6269): At least 40 percent but less than 60 percent impaired, limited or restricted Mobility: Walking and Moving Around Goal Status [phone]): At least 20 percent but less than 40 percent impaired, limited or restricted    [name], PT, DPT  Acute Rehabilitation Services  Pager: [phone]   [name] 08/01/2017, 2:24 PM

## 2017-08-01 NOTE — Care Management Note (Signed)
Case Management Note  Patient Details  Name: [name] MRN: [id number] Date of Birth: 19-Jan-1951  Subjective/Objective:    66 yr old male s/p lumbar decompression L2-L3.                Action/Plan: Patient has requested that [facility] agency. Case manager called referral to Dutch Gray, [facility]. Patient will have family support at discharge.    Expected Discharge Date:  08/01/17               Expected Discharge Plan:  [place]  In-House Referral:  NA  Discharge planning Services  CM Consult  Post Acute Care Choice:  Home Health Choice offered to:  Patient  DME Arranged:  N/A DME Agency:     HH Arranged:  PT [employer] Agency:  [facility] (formerly [employer])  Status of Service:  Completed, signed off  If discussed at [employer], dates discussed:    Additional Comments:  Ninfa Meeker, RN 08/01/2017, 2:11 PM

## 2017-08-01 NOTE — Progress Notes (Signed)
Patient alert and oriented, mae's well, voiding adequate amount of urine, swallowing without difficulty,  c/o mild pain at time of discharge. Patient discharged home with family. Script and discharged instructions given to patient. Patient and family stated understanding of instructions given. Patient has an appointment with Dr. Pool.  

## 2017-08-01 NOTE — Anesthesia Postprocedure Evaluation (Signed)
Anesthesia Post Note  Patient: [name]  Procedure(s) Performed: LAMINECTOMY AND FORAMINOTOMY- BILATERAL LUMBAR TWO- LUMBAR THREE (Bilateral Back)     Patient location during evaluation: PACU Anesthesia Type: General Level of consciousness: awake and alert Pain management: pain level controlled Vital Signs Assessment: post-procedure vital signs reviewed and stable Respiratory status: spontaneous breathing, nonlabored ventilation, respiratory function stable and patient connected to nasal cannula oxygen Cardiovascular status: blood pressure returned to baseline and stable Postop Assessment: no apparent nausea or vomiting Anesthetic complications: no    Last Vitals:  Vitals:   08/01/17 1000 08/01/17 1030  BP: (!) 91/58 (!) 88/49  Pulse: 66 65  Resp: 18 18  Temp:    SpO2:      Last Pain:  Vitals:   08/01/17 0810  TempSrc: Oral  PainSc:                  Riccardo Dubin

## 2017-08-01 NOTE — Discharge Summary (Signed)
Physician Discharge Summary  Patient ID: [name] MRN: [id number] DOB/AGE: [birth year] 68 y.o.  Admit date: 07/31/2017 Discharge date: 08/01/2017  Admission Diagnoses:  Discharge Diagnoses:  Active Problems:   Lumbar stenosis with neurogenic claudication   Discharged Condition: good  Hospital Course: Patient admitted to the hospital where he underwent uncomplicated lumbar decompression at L2-3.  Postoperatively doing well.  Lower extremities with less pain.  Walking better.  Patient ready for discharge home after dialysis today.  Consults:   Significant Diagnostic Studies:   Treatments:   Discharge Exam: Blood pressure 106/63, pulse 63, temperature 98 F (36.7 C), temperature source Oral, resp. rate 18, height 6\' 1"  (1.854 m), weight 100.2 kg (221 lb), SpO2 97 %. Awake and alert.  Awake and alert.  Oriented and appropriate.  Cranial nerve function intact.  Motor and sensory function extremity stable.  Wound clean and dry.  Chest and abdomen benign.  Disposition: 01-Home or Self Care   Allergies as of 08/01/2017      Reactions   Cephalexin Swelling, Other (See Comments)   Tongue swelling   Statins Other (See Comments)   Weak muscles   Ciprofloxacin Rash      Medication List    TAKE these medications   acetaminophen 325 MG tablet Commonly known as:  TYLENOL Take 650 mg by mouth every 6 (six) hours as needed for mild pain or headache.   allopurinol 100 MG tablet Commonly known as:  ZYLOPRIM Take 1 tablet (100 mg total) by mouth daily. What changed:  when to take this   aspirin EC 81 MG tablet Take 81 mg by mouth daily.   citalopram 10 MG tablet Commonly known as:  CELEXA Take 10 mg by mouth at bedtime.   colesevelam 625 MG tablet Commonly known as:  WELCHOL TAKE THREE TABLETS BY MOUTH TWICE DAILY WITH MEALS What changed:  how much to take  how to take this  when to take this  additional instructions   cyclobenzaprine 10 MG tablet Commonly known  as:  FLEXERIL Take 1 tablet (10 mg total) by mouth 3 (three) times daily as needed for muscle spasms.   ezetimibe 10 MG tablet Commonly known as:  ZETIA TAKE ONE TABLET BY MOUTH ONCE DAILY What changed:  See the new instructions.   gabapentin 300 MG capsule Commonly known as:  NEURONTIN Take 1 capsule (300 mg total) by mouth at bedtime. What changed:  how much to take   hydroxypropyl methylcellulose / hypromellose 2.5 % ophthalmic solution Commonly known as:  ISOPTO TEARS / GONIOVISC Place 1 drop into both eyes 3 (three) times daily as needed for dry eyes.   lanthanum 1000 MG chewable tablet Commonly known as:  FOSRENOL Chew 1,000 mg by mouth 2 (two) times daily.   mesalamine 1000 MG suppository Commonly known as:  CANASA Place 0.5 suppositories (500 mg total) rectally at bedtime. What changed:  when to take this  reasons to take this   methimazole 5 MG tablet Commonly known as:  TAPAZOLE Take 1 tablet (5 mg total) by mouth daily.   metoprolol tartrate 25 MG tablet Commonly known as:  LOPRESSOR Take 1 tablet (25 mg total) by mouth 2 (two) times daily. ON NON-HD DAYS What changed:  additional instructions   midodrine 10 MG tablet Commonly known as:  PROAMATINE Take 1 tablet (10 mg total) by mouth See admin instructions. Once a day only on dialysis days (Tues/Thurs/Sat) What changed:  when to take this  additional instructions  multivitamin Tabs tablet Take 1 tablet by mouth daily. Reported on 10/24/2015   oxyCODONE 5 MG immediate release tablet Commonly known as:  Oxy IR/ROXICODONE Take 5 mg by mouth every 6 (six) hours as needed. For pain.   pantoprazole 40 MG tablet Commonly known as:  PROTONIX Take 40 mg by mouth every evening.   polyethylene glycol packet Commonly known as:  MIRALAX / GLYCOLAX Take 17 g by mouth daily. What changed:  when to take this  reasons to take this   SENSIPAR 90 MG tablet Generic drug:  cinacalcet Take 90 mg by mouth 2  (two) times daily.        Signed: Gust Eugene A 08/01/2017, 10:43 AM

## 2017-08-02 ENCOUNTER — Telehealth: Payer: Self-pay | Admitting: *Deleted

## 2017-08-02 DIAGNOSIS — E1122 Type 2 diabetes mellitus with diabetic chronic kidney disease: Secondary | ICD-10-CM | POA: Diagnosis not present

## 2017-08-02 DIAGNOSIS — Z4789 Encounter for other orthopedic aftercare: Secondary | ICD-10-CM | POA: Diagnosis not present

## 2017-08-02 DIAGNOSIS — E1151 Type 2 diabetes mellitus with diabetic peripheral angiopathy without gangrene: Secondary | ICD-10-CM | POA: Diagnosis not present

## 2017-08-02 DIAGNOSIS — E1142 Type 2 diabetes mellitus with diabetic polyneuropathy: Secondary | ICD-10-CM | POA: Diagnosis not present

## 2017-08-02 DIAGNOSIS — I132 Hypertensive heart and chronic kidney disease with heart failure and with stage 5 chronic kidney disease, or end stage renal disease: Secondary | ICD-10-CM | POA: Diagnosis not present

## 2017-08-02 DIAGNOSIS — M48062 Spinal stenosis, lumbar region with neurogenic claudication: Secondary | ICD-10-CM | POA: Diagnosis not present

## 2017-08-02 LAB — HEPATITIS B SURFACE ANTIGEN: Hepatitis B Surface Ag: NEGATIVE

## 2017-08-02 NOTE — Telephone Encounter (Signed)
Pt was on TCM list admitted 07/31/17 for leg pain dx: with  Lumbar stenosis with neurogenic claudication. Patient admitted to the hospital where he underwent uncomplicated lumbar decompression at L2-3.  Postoperatively doing well.  Lower extremities with less pain.  D/c 08/01/17, and will f/u w surgeon Dr. Trenton Gammon...Johny Chess

## 2017-08-03 DIAGNOSIS — D631 Anemia in chronic kidney disease: Secondary | ICD-10-CM | POA: Diagnosis not present

## 2017-08-03 DIAGNOSIS — D509 Iron deficiency anemia, unspecified: Secondary | ICD-10-CM | POA: Diagnosis not present

## 2017-08-03 DIAGNOSIS — N186 End stage renal disease: Secondary | ICD-10-CM | POA: Diagnosis not present

## 2017-08-03 DIAGNOSIS — N2581 Secondary hyperparathyroidism of renal origin: Secondary | ICD-10-CM | POA: Diagnosis not present

## 2017-08-03 DIAGNOSIS — E119 Type 2 diabetes mellitus without complications: Secondary | ICD-10-CM | POA: Diagnosis not present

## 2017-08-05 ENCOUNTER — Other Ambulatory Visit: Payer: Self-pay

## 2017-08-05 DIAGNOSIS — E1142 Type 2 diabetes mellitus with diabetic polyneuropathy: Secondary | ICD-10-CM | POA: Diagnosis not present

## 2017-08-05 DIAGNOSIS — M48062 Spinal stenosis, lumbar region with neurogenic claudication: Secondary | ICD-10-CM | POA: Diagnosis not present

## 2017-08-05 DIAGNOSIS — E1151 Type 2 diabetes mellitus with diabetic peripheral angiopathy without gangrene: Secondary | ICD-10-CM | POA: Diagnosis not present

## 2017-08-05 DIAGNOSIS — I132 Hypertensive heart and chronic kidney disease with heart failure and with stage 5 chronic kidney disease, or end stage renal disease: Secondary | ICD-10-CM | POA: Diagnosis not present

## 2017-08-05 DIAGNOSIS — Z4789 Encounter for other orthopedic aftercare: Secondary | ICD-10-CM | POA: Diagnosis not present

## 2017-08-05 DIAGNOSIS — E1122 Type 2 diabetes mellitus with diabetic chronic kidney disease: Secondary | ICD-10-CM | POA: Diagnosis not present

## 2017-08-05 NOTE — Patient Outreach (Signed)
[facility]) Care Management  08/05/17  [name] 02-15-1951 [id number]   Patient was admitted on 07/31/2017 for elective lumbar decompression at L2-3. He was discharged home on 26/05/3418 after an uncomplicated surgery and demonstrated less pain, walking better.   Successful outreach completed with patient and his wife, [name]. Patient identification verified.  Subjective: "I can tell a big difference since my surgery."  Objective:  Assessment:  Patient stated that he is feeling a lot better since his surgery. He stated that he does have post-surgical pain at his incision site as expected, but his overall pain is better. He stated that he is hesitant to say it is 100% better, but it is better with some pain at times. He stated that he is hoping to have better mobility since he had the surgery. Patient was discharged home with OT and PT. His OT was just there today. Patient reported that prior to his back surgery, he was no longer able to walk very much. He stated that his pain had gotten so bad and his wife took him to a neurologist. He stated that the neurologist told them that there was nothing he could do by surgery and that injections would not be helpful.  He stated that he is still having difficulty, but it is better than before.   Patient's wife reported that patient had drainage coming from his incision over the weekend. She described the drainage as "bloody." She stated that it is actually better today. Patient denies any new or worsening pain at incision site and has not had a fever. RNCM instructed patient to continue to have his wife look at his incision site daily and report any signs/symptoms of infection to his doctor.   Patient has no other complaints or concerns at present.   Plan: Home visit is scheduled for this week to assess patient status post-operatively and offer support and education. Will also assess incisional wound and drainage for signs  and symptoms of infection and report any changes to MD if needed.  [place] R. Giorgi Debruin, RN, BSN, [place] Management Coordinator [phone]

## 2017-08-06 DIAGNOSIS — N186 End stage renal disease: Secondary | ICD-10-CM | POA: Diagnosis not present

## 2017-08-06 DIAGNOSIS — D509 Iron deficiency anemia, unspecified: Secondary | ICD-10-CM | POA: Diagnosis not present

## 2017-08-06 DIAGNOSIS — N2581 Secondary hyperparathyroidism of renal origin: Secondary | ICD-10-CM | POA: Diagnosis not present

## 2017-08-06 DIAGNOSIS — E119 Type 2 diabetes mellitus without complications: Secondary | ICD-10-CM | POA: Diagnosis not present

## 2017-08-06 DIAGNOSIS — D631 Anemia in chronic kidney disease: Secondary | ICD-10-CM | POA: Diagnosis not present

## 2017-08-07 IMAGING — CR DG CHEST 1V PORT
1 series · 1 of 1 positions shown · non-contrast
Comparison: 08/04/2016

CLINICAL DATA: Completed dialysis, hypotensive, headache

EXAM:
PORTABLE CHEST 1 VIEW

[AP]
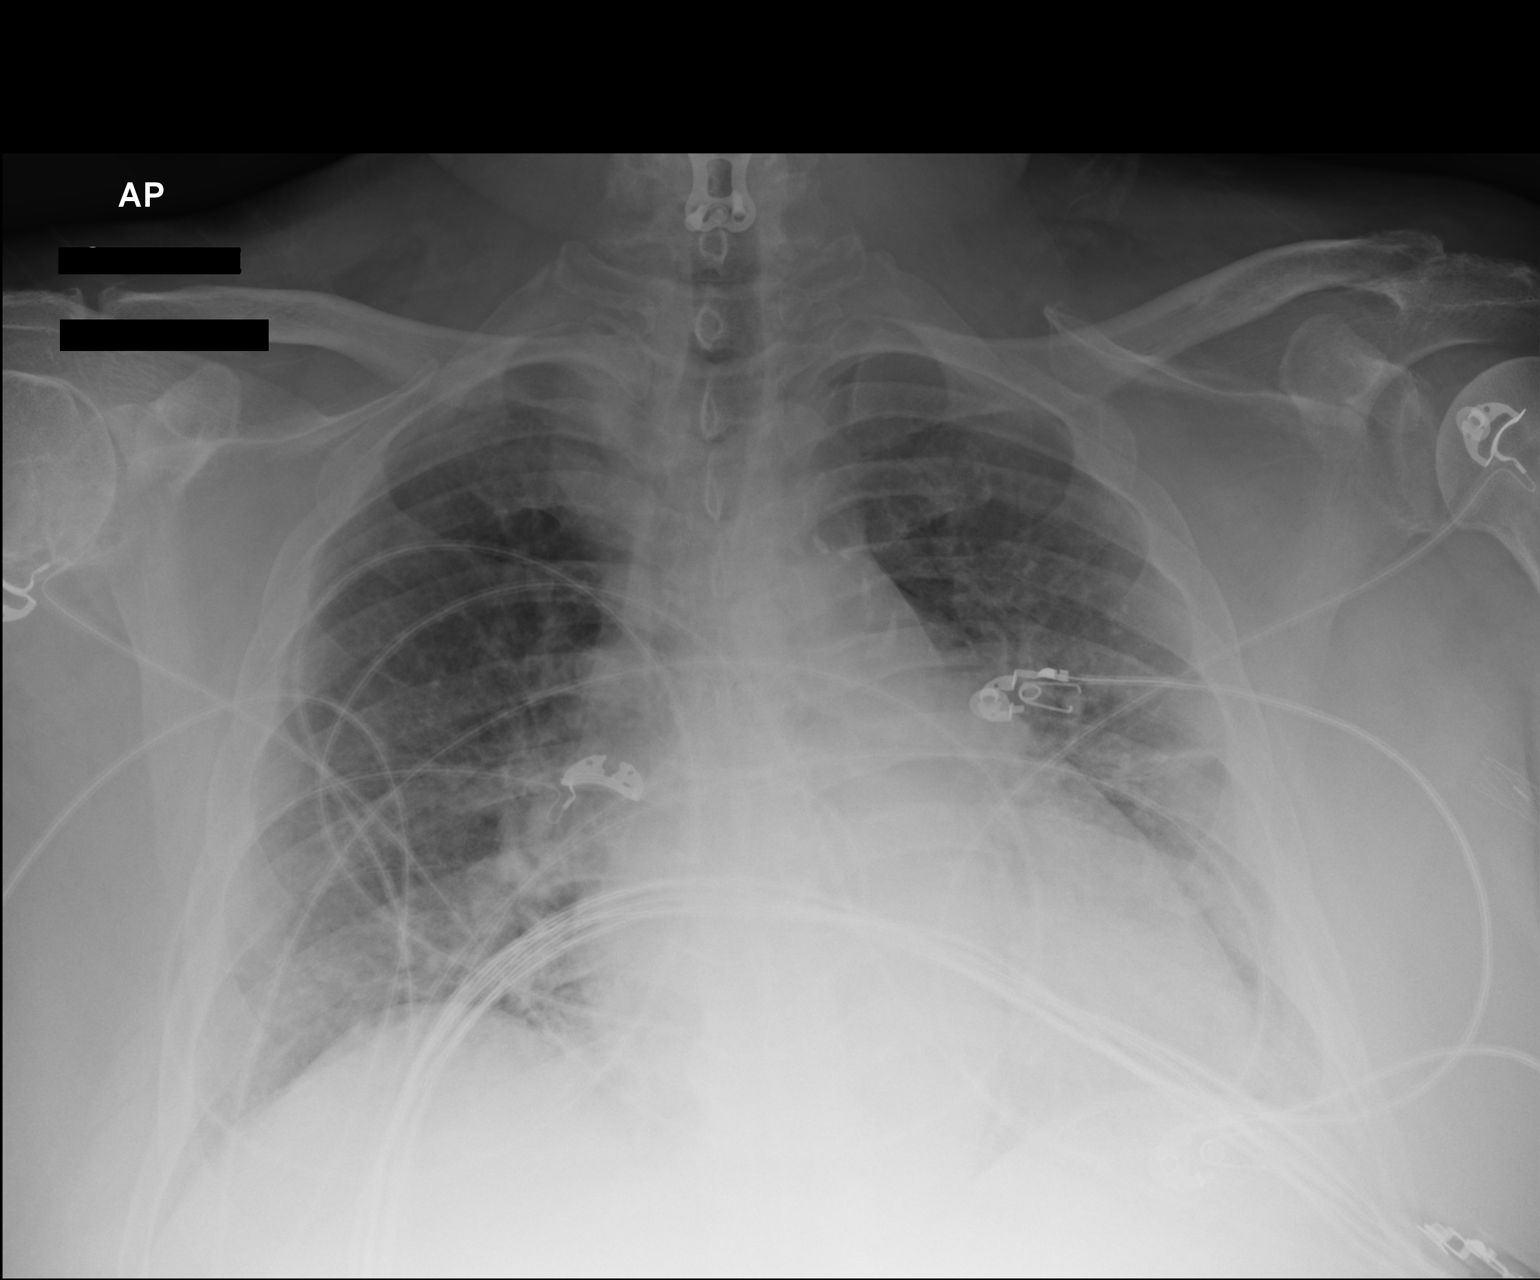

[1 of 1 positions shown; findings below may reference images not displayed]

FINDINGS: Partially visualized cervical spine hardware.

Low lung volumes. Mild cardiomegaly with mild central vascular
congestion. No acute consolidation. Cannot exclude tiny left
effusion. Atherosclerosis of the aorta. No pneumothorax.
IMPRESSION: 1. Low lung volumes
2. Cardiomegaly with mild central vascular congestion
3. Possible tiny left pleural effusion

## 2017-08-07 IMAGING — CT CT HEAD W/O CM
3 of 4 series · 17 of 47 positions shown, 20 images · non-contrast
Comparison: 10/25/2009

CLINICAL DATA: Headache and blurry vision weakness after dialysis

EXAM:
CT HEAD WITHOUT CONTRAST
TECHNIQUE: Contiguous axial images were obtained from the base of the skull
through the vertex without intravenous contrast.

[Series 201: head w/o, idose (1) · axial · non-contrast · 0.49mm/px · z∈[+30,+165]mm · 11 of 33 slices shown, 14 images]
[im 3/33  brain]
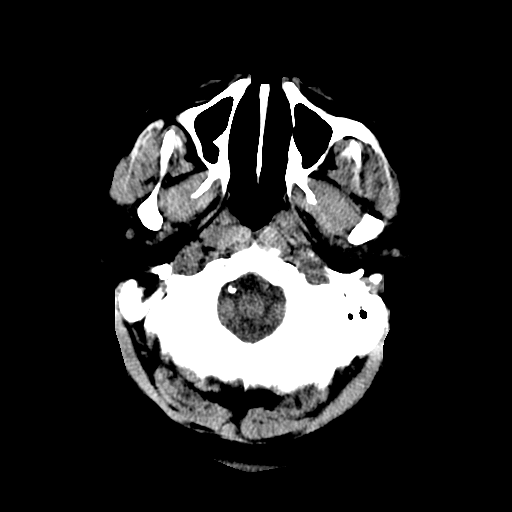
[im 3/33  bone]
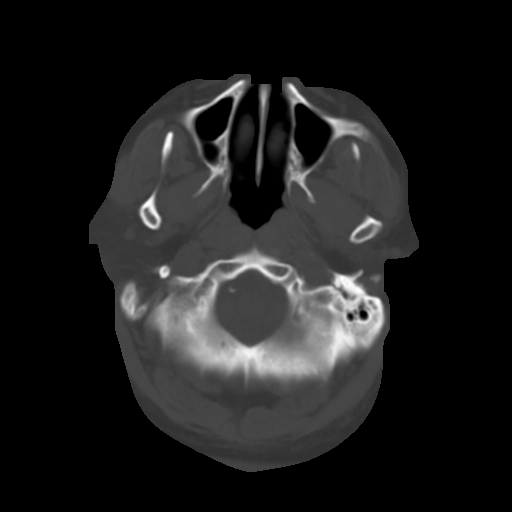
[im 5/33  brain]
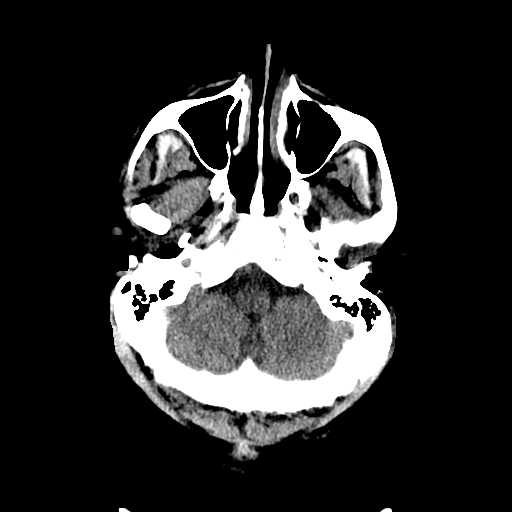
[im 7/33  brain]
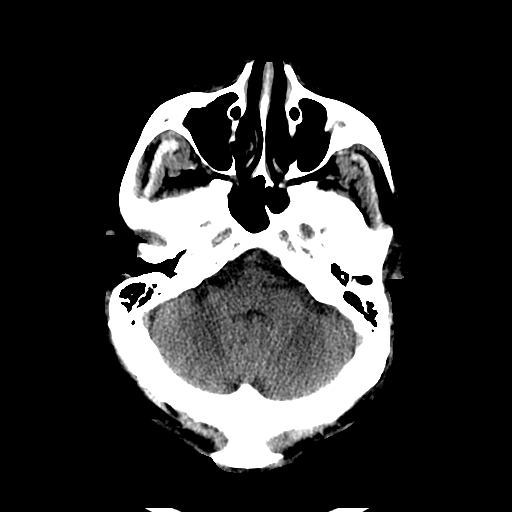
[im 12/33  brain]
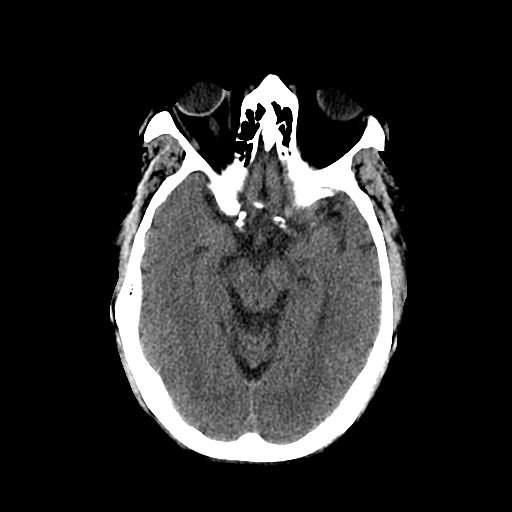
[im 14/33  brain]
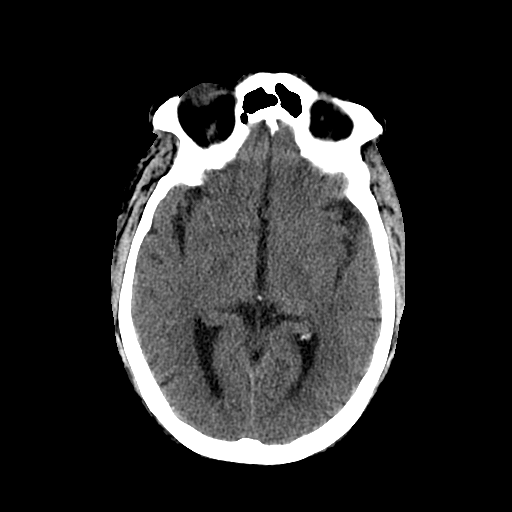
[im 14/33  bone]
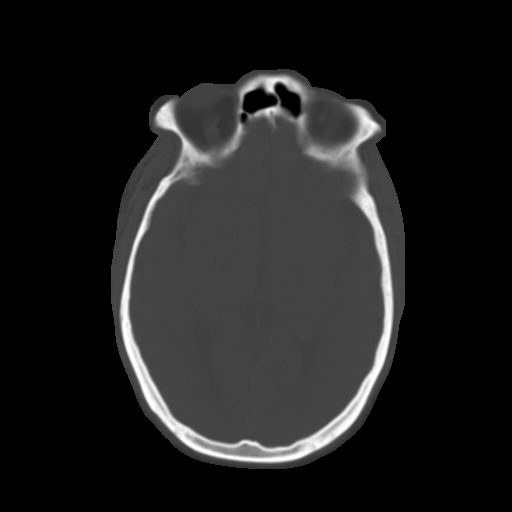
[im 17/33  brain]
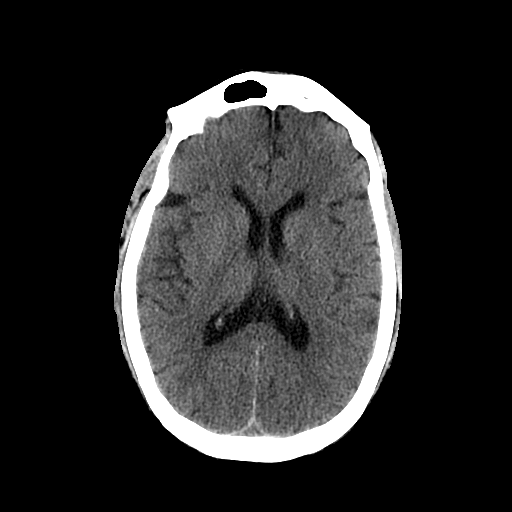
[im 19/33  brain]
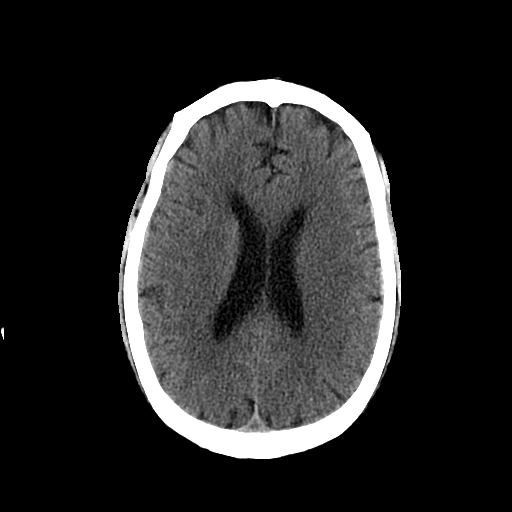
[im 21/33  brain]
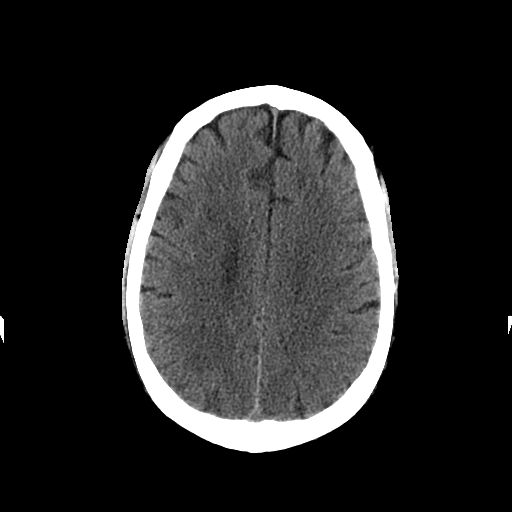
[im 26/33  brain]
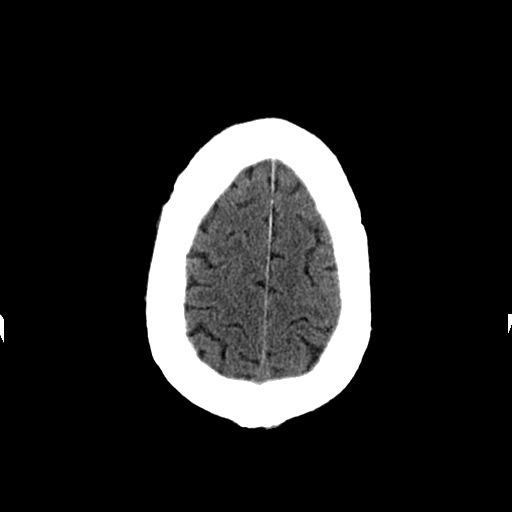
[im 26/33  bone]
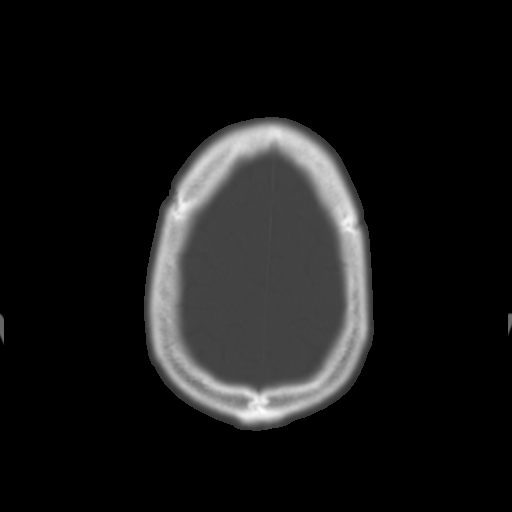
[im 28/33  brain]
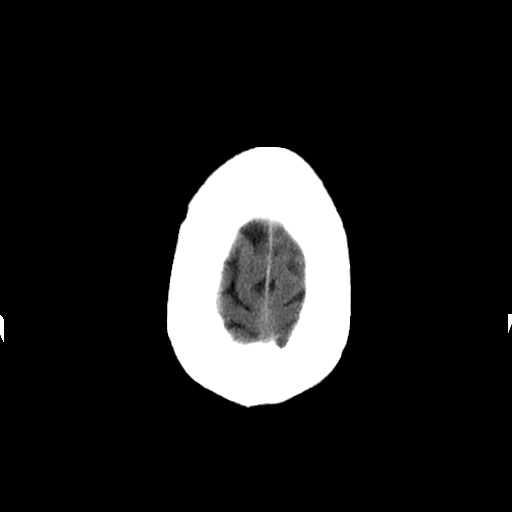
[im 30/33  brain]
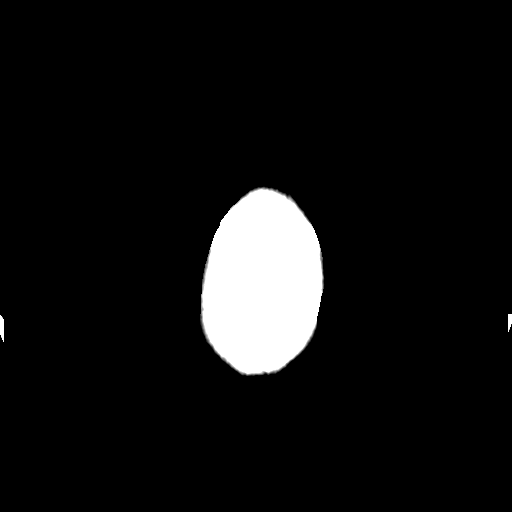

[Series 203: coronal st, idose (1) · coronal · 0.40mm/px · 3 of 72 slices shown]
[im 24/72  brain]
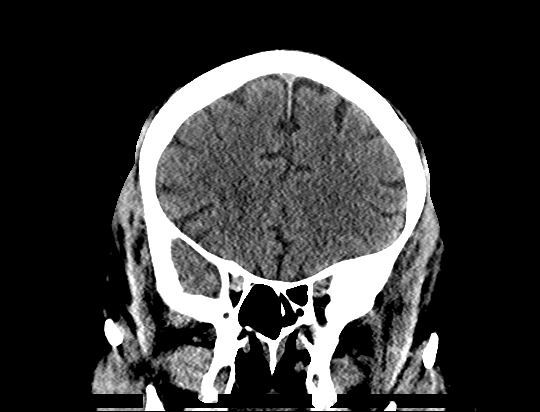
[im 32/72  brain]
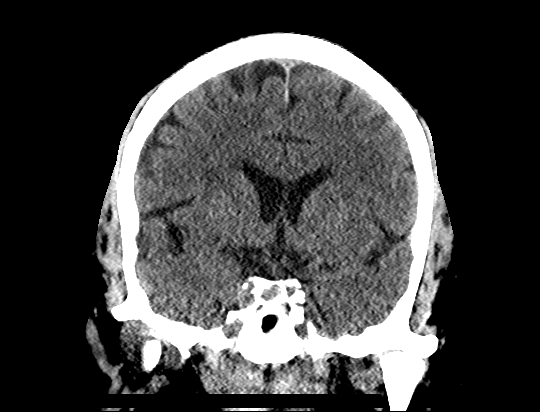
[im 40/72  brain]
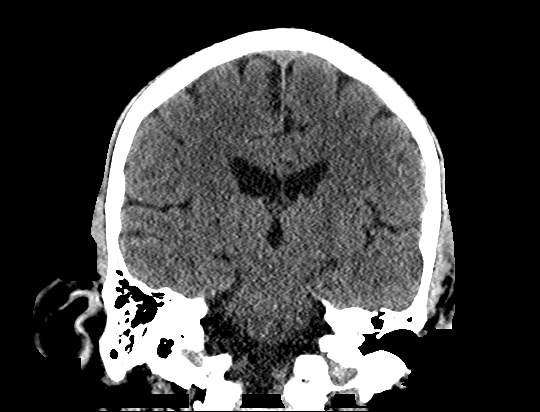

[Series 204: sagittal st, idose (1) · sagittal · 0.40mm/px · 3 of 72 slices shown]
[im 24/72  brain]
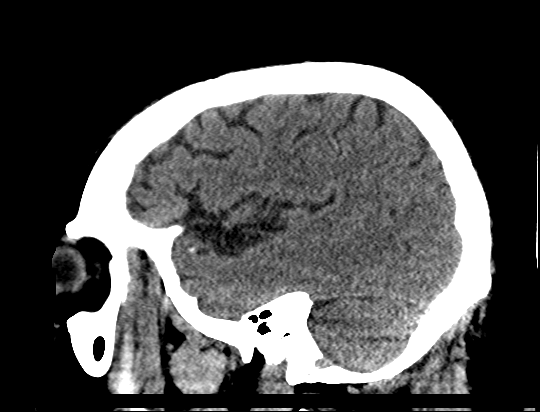
[im 36/72  brain]
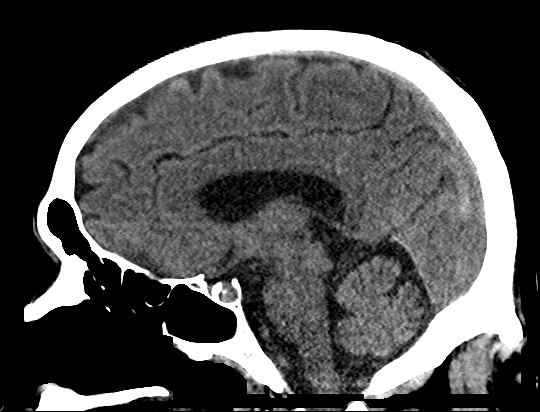
[im 48/72  brain]
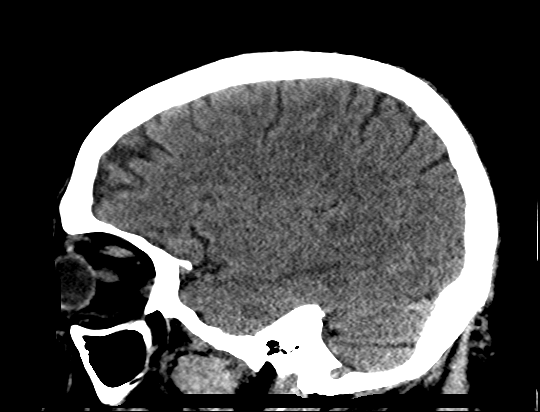

[17 of 47 positions shown; findings below may reference images not displayed]

FINDINGS: Brain: No evidence of acute infarction, hemorrhage, hydrocephalus,
extra-axial collection or mass lesion/mass effect.

Vascular: No hyperdense vessels.  Carotid artery calcifications.

Skull: No fracture.  Mastoid air cells clear.

Sinuses/Orbits: Mild mucosal thickening in the sphenoid and ethmoid
sinuses. No acute orbital abnormality.

Other: None
IMPRESSION: No CT evidence for acute intracranial abnormality.

## 2017-08-08 DIAGNOSIS — N186 End stage renal disease: Secondary | ICD-10-CM | POA: Diagnosis not present

## 2017-08-08 DIAGNOSIS — D631 Anemia in chronic kidney disease: Secondary | ICD-10-CM | POA: Diagnosis not present

## 2017-08-08 DIAGNOSIS — N2581 Secondary hyperparathyroidism of renal origin: Secondary | ICD-10-CM | POA: Diagnosis not present

## 2017-08-08 DIAGNOSIS — E119 Type 2 diabetes mellitus without complications: Secondary | ICD-10-CM | POA: Diagnosis not present

## 2017-08-08 DIAGNOSIS — D509 Iron deficiency anemia, unspecified: Secondary | ICD-10-CM | POA: Diagnosis not present

## 2017-08-08 IMAGING — CT CT ANGIO NECK
1 of 8 series · 6 of 33 positions shown · IV contrast (OMNI 350)
Comparison: CT head 10/16/2016

CLINICAL DATA: Headache and blurred vision.  Dialysis patient

EXAM:
CT ANGIOGRAPHY HEAD AND NECK
TECHNIQUE: Multidetector CT imaging of the head and neck was performed using
the standard protocol during bolus administration of intravenous
contrast. Multiplanar CT image reconstructions and MIPs were
obtained to evaluate the vascular anatomy. Carotid stenosis
measurements (when applicable) are obtained utilizing NASCET
criteria, using the distal internal carotid diameter as the
denominator.
CONTRAST:  50 mL Isovue 370 IV

[Series 7: cta neck axial · axial · 0.49mm/px · z∈[-275,-15]mm · 6 of 364 slices shown]
[im 52/364  soft-tissue]
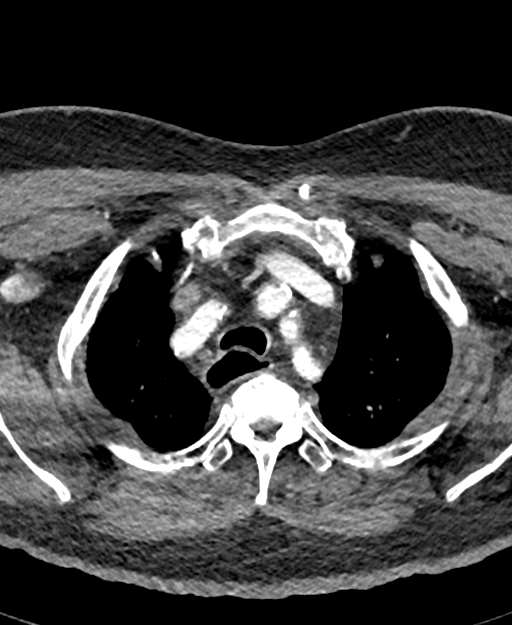
[im 104/364  bone]
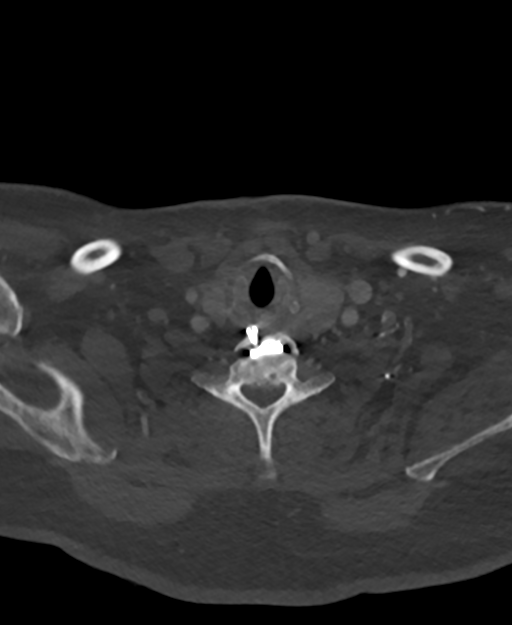
[im 156/364  soft-tissue]
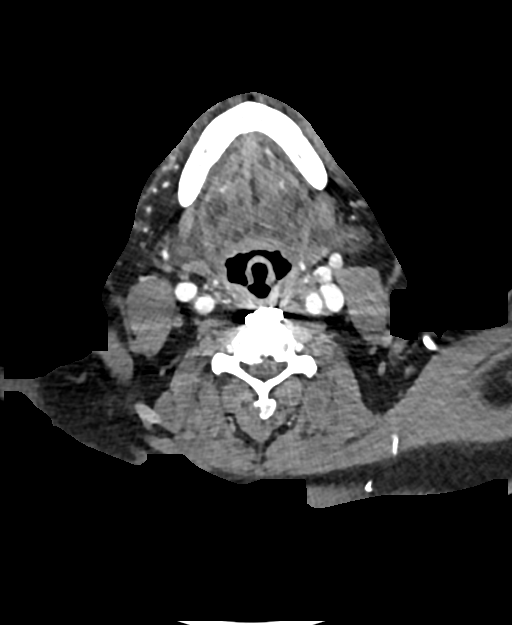
[im 208/364  bone]
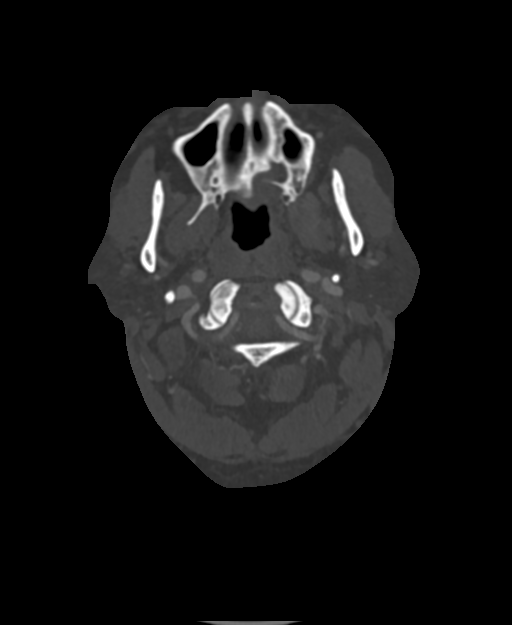
[im 260/364  soft-tissue]
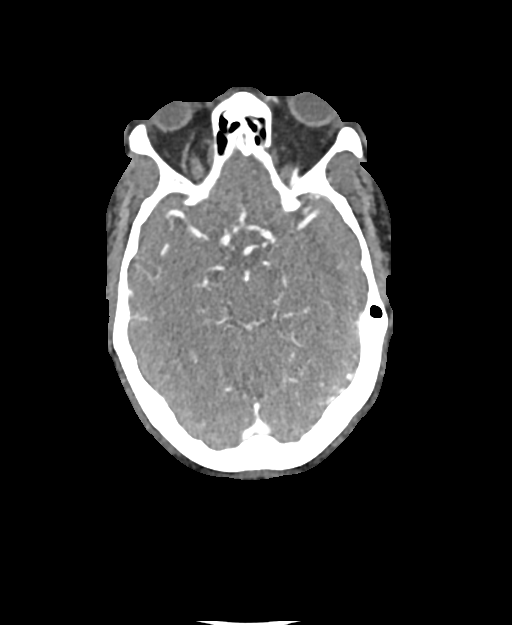
[im 312/364  bone]
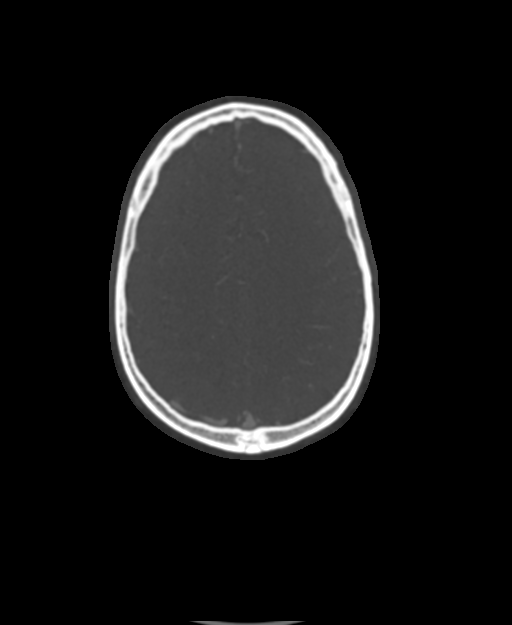

[6 of 33 positions shown; findings below may reference images not displayed]

FINDINGS: CTA NECK FINDINGS

Aortic arch: Atherosclerotic calcification throughout the aortic
arch without dissection or aneurysm. Mild atherosclerotic disease in
the proximal great vessels which are patent.

Right carotid system: Mild diffuse atherosclerotic disease in the
right common carotid artery. Atherosclerotic calcification proximal
right internal carotid artery without significant stenosis.

Left carotid system: Diffuse atherosclerotic disease left common
carotid artery without stenosis. Calcified and noncalcified plaque
left carotid bulb. Less than 25% diameter stenosis left internal
carotid artery. Left external carotid artery widely patent.

Vertebral arteries: Mild stenosis proximal left vertebral artery.
Atherosclerotic plaque and calcification distal vertebral artery
bilaterally with severe stenosis distal right vertebral artery and
mild stenosis distal left vertebral artery.

Skeleton: ACDF C3 through C6. C6 screw on the right has backed [DATE] cm unchanged from 06/02/2014. No acute skeletal
abnormality.

Other neck: 12 mm right thyroid nodule likely a cyst. Small
calcification left thyroid.

Upper chest: Lung apices clear.

Review of the MIP images confirms the above findings

CTA HEAD FINDINGS

Anterior circulation: Extensive atherosclerotic calcification in the
cavernous carotid bilaterally causing moderate to severe luminal
stenosis bilaterally. Anterior and middle cerebral arteries patent
bilaterally without significant stenosis. Mild irregularity of M1
and M2 segments compatible with mild atherosclerotic disease.

Posterior circulation: Both vertebral arteries patent to the
basilar. Severe calcific stenosis distal right vertebral artery at
the level of the dura. Mild stenosis distal left vertebral artery.
The basilar is patent with mild atherosclerotic irregularity and no
significant stenosis. PICA patent bilaterally. Superior cerebellar
and posterior cerebral arteries patent bilaterally without
significant stenosis.

Venous sinuses: Patent

Anatomic variants: None

Delayed phase: Normal enhancement on delayed imaging.

Review of the MIP images confirms the above findings
IMPRESSION: Atherosclerotic disease at the carotid bifurcation bilaterally
without significant stenosis. Moderate to severe stenosis in the
cavernous carotid bilaterally due to extensive atherosclerotic
calcification.

Mild stenosis at the origin of the left vertebral artery. Severe
stenosis of the distal right vertebral artery and mild stenosis
distal left vertebral artery

No large vessel occlusion.

## 2017-08-09 ENCOUNTER — Ambulatory Visit: Payer: Self-pay

## 2017-08-09 DIAGNOSIS — Z4789 Encounter for other orthopedic aftercare: Secondary | ICD-10-CM | POA: Diagnosis not present

## 2017-08-09 DIAGNOSIS — E1122 Type 2 diabetes mellitus with diabetic chronic kidney disease: Secondary | ICD-10-CM | POA: Diagnosis not present

## 2017-08-09 DIAGNOSIS — E1151 Type 2 diabetes mellitus with diabetic peripheral angiopathy without gangrene: Secondary | ICD-10-CM | POA: Diagnosis not present

## 2017-08-09 DIAGNOSIS — E1142 Type 2 diabetes mellitus with diabetic polyneuropathy: Secondary | ICD-10-CM | POA: Diagnosis not present

## 2017-08-09 DIAGNOSIS — I132 Hypertensive heart and chronic kidney disease with heart failure and with stage 5 chronic kidney disease, or end stage renal disease: Secondary | ICD-10-CM | POA: Diagnosis not present

## 2017-08-09 DIAGNOSIS — M48062 Spinal stenosis, lumbar region with neurogenic claudication: Secondary | ICD-10-CM | POA: Diagnosis not present

## 2017-08-10 ENCOUNTER — Other Ambulatory Visit: Payer: Self-pay

## 2017-08-10 DIAGNOSIS — N186 End stage renal disease: Secondary | ICD-10-CM | POA: Diagnosis not present

## 2017-08-10 DIAGNOSIS — D631 Anemia in chronic kidney disease: Secondary | ICD-10-CM | POA: Diagnosis not present

## 2017-08-10 DIAGNOSIS — D509 Iron deficiency anemia, unspecified: Secondary | ICD-10-CM | POA: Diagnosis not present

## 2017-08-10 DIAGNOSIS — E119 Type 2 diabetes mellitus without complications: Secondary | ICD-10-CM | POA: Diagnosis not present

## 2017-08-10 DIAGNOSIS — N2581 Secondary hyperparathyroidism of renal origin: Secondary | ICD-10-CM | POA: Diagnosis not present

## 2017-08-10 NOTE — Patient Outreach (Signed)
[place]) Care Management  08/10/17  [name] 1951/09/30 [id number]  Received voicemail from patient's wife, Darrill Vreeland on 08/09/2017 requesting callback about home visit that was scheduled for that day.  RNCM attempted to reach Ms. Vowell without success. Left HIPAA compliant voicemail with RNCM contact information and her to to return call.  [place] R. Glanda Spanbauer, RN, BSN, [place] Management Coordinator [phone]

## 2017-08-12 ENCOUNTER — Other Ambulatory Visit: Payer: Self-pay | Admitting: Internal Medicine

## 2017-08-13 DIAGNOSIS — D631 Anemia in chronic kidney disease: Secondary | ICD-10-CM | POA: Diagnosis not present

## 2017-08-13 DIAGNOSIS — N186 End stage renal disease: Secondary | ICD-10-CM | POA: Diagnosis not present

## 2017-08-13 DIAGNOSIS — N2581 Secondary hyperparathyroidism of renal origin: Secondary | ICD-10-CM | POA: Diagnosis not present

## 2017-08-13 DIAGNOSIS — E119 Type 2 diabetes mellitus without complications: Secondary | ICD-10-CM | POA: Diagnosis not present

## 2017-08-13 DIAGNOSIS — D509 Iron deficiency anemia, unspecified: Secondary | ICD-10-CM | POA: Diagnosis not present

## 2017-08-14 DIAGNOSIS — M48062 Spinal stenosis, lumbar region with neurogenic claudication: Secondary | ICD-10-CM | POA: Diagnosis not present

## 2017-08-14 DIAGNOSIS — I132 Hypertensive heart and chronic kidney disease with heart failure and with stage 5 chronic kidney disease, or end stage renal disease: Secondary | ICD-10-CM | POA: Diagnosis not present

## 2017-08-14 DIAGNOSIS — E1122 Type 2 diabetes mellitus with diabetic chronic kidney disease: Secondary | ICD-10-CM | POA: Diagnosis not present

## 2017-08-14 DIAGNOSIS — E1151 Type 2 diabetes mellitus with diabetic peripheral angiopathy without gangrene: Secondary | ICD-10-CM | POA: Diagnosis not present

## 2017-08-14 DIAGNOSIS — E1142 Type 2 diabetes mellitus with diabetic polyneuropathy: Secondary | ICD-10-CM | POA: Diagnosis not present

## 2017-08-14 DIAGNOSIS — Z4789 Encounter for other orthopedic aftercare: Secondary | ICD-10-CM | POA: Diagnosis not present

## 2017-08-15 DIAGNOSIS — D509 Iron deficiency anemia, unspecified: Secondary | ICD-10-CM | POA: Diagnosis not present

## 2017-08-15 DIAGNOSIS — N2581 Secondary hyperparathyroidism of renal origin: Secondary | ICD-10-CM | POA: Diagnosis not present

## 2017-08-15 DIAGNOSIS — E119 Type 2 diabetes mellitus without complications: Secondary | ICD-10-CM | POA: Diagnosis not present

## 2017-08-15 DIAGNOSIS — D631 Anemia in chronic kidney disease: Secondary | ICD-10-CM | POA: Diagnosis not present

## 2017-08-15 DIAGNOSIS — N186 End stage renal disease: Secondary | ICD-10-CM | POA: Diagnosis not present

## 2017-08-16 ENCOUNTER — Other Ambulatory Visit: Payer: Self-pay

## 2017-08-16 DIAGNOSIS — I132 Hypertensive heart and chronic kidney disease with heart failure and with stage 5 chronic kidney disease, or end stage renal disease: Secondary | ICD-10-CM | POA: Diagnosis not present

## 2017-08-16 DIAGNOSIS — M48062 Spinal stenosis, lumbar region with neurogenic claudication: Secondary | ICD-10-CM | POA: Diagnosis not present

## 2017-08-16 DIAGNOSIS — Z4789 Encounter for other orthopedic aftercare: Secondary | ICD-10-CM | POA: Diagnosis not present

## 2017-08-16 DIAGNOSIS — E1151 Type 2 diabetes mellitus with diabetic peripheral angiopathy without gangrene: Secondary | ICD-10-CM | POA: Diagnosis not present

## 2017-08-16 DIAGNOSIS — E1142 Type 2 diabetes mellitus with diabetic polyneuropathy: Secondary | ICD-10-CM | POA: Diagnosis not present

## 2017-08-16 DIAGNOSIS — E1122 Type 2 diabetes mellitus with diabetic chronic kidney disease: Secondary | ICD-10-CM | POA: Diagnosis not present

## 2017-08-16 NOTE — Patient Outreach (Signed)
[place] [facility]) Care Management  08/16/17  [name] 10-16-1950 [id number]  Attempted to reach patient x 2 attempts without success. Phone line was busy and RNCM was unable to leave a message.  [name], RN, BSN, [place] Management Coordinator [phone]

## 2017-08-17 DIAGNOSIS — D631 Anemia in chronic kidney disease: Secondary | ICD-10-CM | POA: Diagnosis not present

## 2017-08-17 DIAGNOSIS — E119 Type 2 diabetes mellitus without complications: Secondary | ICD-10-CM | POA: Diagnosis not present

## 2017-08-17 DIAGNOSIS — N2581 Secondary hyperparathyroidism of renal origin: Secondary | ICD-10-CM | POA: Diagnosis not present

## 2017-08-17 DIAGNOSIS — D509 Iron deficiency anemia, unspecified: Secondary | ICD-10-CM | POA: Diagnosis not present

## 2017-08-17 DIAGNOSIS — N186 End stage renal disease: Secondary | ICD-10-CM | POA: Diagnosis not present

## 2017-08-19 DIAGNOSIS — N186 End stage renal disease: Secondary | ICD-10-CM | POA: Diagnosis not present

## 2017-08-19 DIAGNOSIS — D509 Iron deficiency anemia, unspecified: Secondary | ICD-10-CM | POA: Diagnosis not present

## 2017-08-19 DIAGNOSIS — E119 Type 2 diabetes mellitus without complications: Secondary | ICD-10-CM | POA: Diagnosis not present

## 2017-08-19 DIAGNOSIS — D631 Anemia in chronic kidney disease: Secondary | ICD-10-CM | POA: Diagnosis not present

## 2017-08-19 DIAGNOSIS — N2581 Secondary hyperparathyroidism of renal origin: Secondary | ICD-10-CM | POA: Diagnosis not present

## 2017-08-20 DIAGNOSIS — E1151 Type 2 diabetes mellitus with diabetic peripheral angiopathy without gangrene: Secondary | ICD-10-CM | POA: Diagnosis not present

## 2017-08-20 DIAGNOSIS — Z4789 Encounter for other orthopedic aftercare: Secondary | ICD-10-CM | POA: Diagnosis not present

## 2017-08-20 DIAGNOSIS — M48062 Spinal stenosis, lumbar region with neurogenic claudication: Secondary | ICD-10-CM | POA: Diagnosis not present

## 2017-08-20 DIAGNOSIS — I132 Hypertensive heart and chronic kidney disease with heart failure and with stage 5 chronic kidney disease, or end stage renal disease: Secondary | ICD-10-CM | POA: Diagnosis not present

## 2017-08-20 DIAGNOSIS — E1142 Type 2 diabetes mellitus with diabetic polyneuropathy: Secondary | ICD-10-CM | POA: Diagnosis not present

## 2017-08-20 DIAGNOSIS — E1122 Type 2 diabetes mellitus with diabetic chronic kidney disease: Secondary | ICD-10-CM | POA: Diagnosis not present

## 2017-08-21 ENCOUNTER — Ambulatory Visit: Payer: Medicare Other | Admitting: Neurology

## 2017-08-21 DIAGNOSIS — D631 Anemia in chronic kidney disease: Secondary | ICD-10-CM | POA: Diagnosis not present

## 2017-08-21 DIAGNOSIS — D509 Iron deficiency anemia, unspecified: Secondary | ICD-10-CM | POA: Diagnosis not present

## 2017-08-21 DIAGNOSIS — N2581 Secondary hyperparathyroidism of renal origin: Secondary | ICD-10-CM | POA: Diagnosis not present

## 2017-08-21 DIAGNOSIS — N186 End stage renal disease: Secondary | ICD-10-CM | POA: Diagnosis not present

## 2017-08-21 DIAGNOSIS — E119 Type 2 diabetes mellitus without complications: Secondary | ICD-10-CM | POA: Diagnosis not present

## 2017-08-23 ENCOUNTER — Other Ambulatory Visit: Payer: Self-pay

## 2017-08-23 DIAGNOSIS — Z4789 Encounter for other orthopedic aftercare: Secondary | ICD-10-CM | POA: Diagnosis not present

## 2017-08-23 DIAGNOSIS — E1151 Type 2 diabetes mellitus with diabetic peripheral angiopathy without gangrene: Secondary | ICD-10-CM | POA: Diagnosis not present

## 2017-08-23 DIAGNOSIS — I132 Hypertensive heart and chronic kidney disease with heart failure and with stage 5 chronic kidney disease, or end stage renal disease: Secondary | ICD-10-CM | POA: Diagnosis not present

## 2017-08-23 DIAGNOSIS — M48062 Spinal stenosis, lumbar region with neurogenic claudication: Secondary | ICD-10-CM | POA: Diagnosis not present

## 2017-08-23 DIAGNOSIS — E1122 Type 2 diabetes mellitus with diabetic chronic kidney disease: Secondary | ICD-10-CM | POA: Diagnosis not present

## 2017-08-23 DIAGNOSIS — E1142 Type 2 diabetes mellitus with diabetic polyneuropathy: Secondary | ICD-10-CM | POA: Diagnosis not present

## 2017-08-23 NOTE — Patient Outreach (Signed)
[name] - [place]) Care Management  08/23/17  [name] [birth year] [id number]  Successful outreach completed with patient. Patient identification verified.  Patient stated "I am doing a whole lot better." Stated that almost all of his pain is gone except for some continued pain in his right hip. He stated that the pain in his hip is 8/10 when up and moving, but denies any pain when sitting still. He reported that his PT, [name] came out today and "when she gets me moving and stirring, the pain starts coming." He stated that his doctor is not aware of his pain in his hip, but he has an appointment to see him next week and he will discuss it with him.  Patient stated that he is "getting around a lot better." He stated that he "does not want to do too much because of the pain."   RNCM encouraged patient to talk with his doctor. Also discussed patient's goal of increasing his mobility so that he can be re-evaluated for a kidney transplant. In order to be re-evaluated, he will need to be able to ambulate without his RW and in order to get there, he will need to participate in therapy so he can increased his strength and endurance. Encouraged him to discuss this with his doctor so that he is aware of pain and that pain is an issue keeping him from moving forward with his overall goal. Patient verbalized understanding, stating that he does want to continue to work towards getting a kidney transplant. He can be re-evaluated in January 2019.    Patient stated that he has no other questions or concerns. His wife stated that he is now in the donut hole and cannot afford a couple of his medications. The medications that he is out of that he cannot afford are welchol and zetia. [name] did stated that they could get a 30 day supply of the zetia for $26, but the welchol costs $200 for a 30 day supply and they cannot afford that. Other than that, they currently deny any other questions or  concerns.  Plan: Will follow up with PCP next week regarding welchol and zetia.   Home visit scheduled in 2 weeks.  [name] R. Jenesa Foresta, RN, BSN, [place] Management Coordinator [phone]

## 2017-08-24 DIAGNOSIS — N186 End stage renal disease: Secondary | ICD-10-CM | POA: Diagnosis not present

## 2017-08-24 DIAGNOSIS — D631 Anemia in chronic kidney disease: Secondary | ICD-10-CM | POA: Diagnosis not present

## 2017-08-24 DIAGNOSIS — E119 Type 2 diabetes mellitus without complications: Secondary | ICD-10-CM | POA: Diagnosis not present

## 2017-08-24 DIAGNOSIS — D509 Iron deficiency anemia, unspecified: Secondary | ICD-10-CM | POA: Diagnosis not present

## 2017-08-24 DIAGNOSIS — N2581 Secondary hyperparathyroidism of renal origin: Secondary | ICD-10-CM | POA: Diagnosis not present

## 2017-08-26 ENCOUNTER — Ambulatory Visit: Payer: Medicare Other | Admitting: Neurology

## 2017-08-26 ENCOUNTER — Telehealth: Payer: Self-pay | Admitting: Internal Medicine

## 2017-08-26 ENCOUNTER — Other Ambulatory Visit: Payer: Self-pay

## 2017-08-26 DIAGNOSIS — E1151 Type 2 diabetes mellitus with diabetic peripheral angiopathy without gangrene: Secondary | ICD-10-CM | POA: Diagnosis not present

## 2017-08-26 DIAGNOSIS — I132 Hypertensive heart and chronic kidney disease with heart failure and with stage 5 chronic kidney disease, or end stage renal disease: Secondary | ICD-10-CM | POA: Diagnosis not present

## 2017-08-26 DIAGNOSIS — M48062 Spinal stenosis, lumbar region with neurogenic claudication: Secondary | ICD-10-CM | POA: Diagnosis not present

## 2017-08-26 DIAGNOSIS — E1122 Type 2 diabetes mellitus with diabetic chronic kidney disease: Secondary | ICD-10-CM | POA: Diagnosis not present

## 2017-08-26 DIAGNOSIS — E1142 Type 2 diabetes mellitus with diabetic polyneuropathy: Secondary | ICD-10-CM | POA: Diagnosis not present

## 2017-08-26 DIAGNOSIS — Z4789 Encounter for other orthopedic aftercare: Secondary | ICD-10-CM | POA: Diagnosis not present

## 2017-08-26 NOTE — Patient Outreach (Signed)
[place] [facility]) Care Management  08/26/17  [name] Jul 15, 1951 [id number]  RNCM contacted Dr. Gwynn Burly office and left a message with Gareth Eagle regarding patient's medication issues: out of welchol and zetia and in donut hole and unable to get filled. Asked for possibility of getting samples or alternative until able to afford to get filled? Gareth Eagle will let Dr. [name] know and will contact RNCM with response.  RNCM contact information provided. Awaiting callback.  [place] R. Honest Vanleer, RN, BSN, [place] Management Coordinator [phone]

## 2017-08-26 NOTE — Telephone Encounter (Signed)
[place] from [employer] called stating that the pt is needing refill on Netezetimibe (ZETIA) 10 MG tablet and colesevelam (WELCHOL) 625 MG tablet but is in the donut hole and cannot afford these medications. She wanted to know what the pt could do or if he could have samples to get him through until the end of the year. Please advise.

## 2017-08-26 NOTE — Telephone Encounter (Signed)
Please advise 

## 2017-08-26 NOTE — Telephone Encounter (Signed)
Very sorry, we dont have samples, and I dont have any way of helping with this

## 2017-08-27 DIAGNOSIS — D631 Anemia in chronic kidney disease: Secondary | ICD-10-CM | POA: Diagnosis not present

## 2017-08-27 DIAGNOSIS — D509 Iron deficiency anemia, unspecified: Secondary | ICD-10-CM | POA: Diagnosis not present

## 2017-08-27 DIAGNOSIS — N2581 Secondary hyperparathyroidism of renal origin: Secondary | ICD-10-CM | POA: Diagnosis not present

## 2017-08-27 DIAGNOSIS — E119 Type 2 diabetes mellitus without complications: Secondary | ICD-10-CM | POA: Diagnosis not present

## 2017-08-27 DIAGNOSIS — N186 End stage renal disease: Secondary | ICD-10-CM | POA: Diagnosis not present

## 2017-08-27 NOTE — Telephone Encounter (Signed)
Spoke to [name] and let her know. She said that she would contact their pharmacy to see what they could do for the pt but appreciated our help.

## 2017-08-29 DIAGNOSIS — N186 End stage renal disease: Secondary | ICD-10-CM | POA: Diagnosis not present

## 2017-08-29 DIAGNOSIS — E119 Type 2 diabetes mellitus without complications: Secondary | ICD-10-CM | POA: Diagnosis not present

## 2017-08-29 DIAGNOSIS — N2581 Secondary hyperparathyroidism of renal origin: Secondary | ICD-10-CM | POA: Diagnosis not present

## 2017-08-29 DIAGNOSIS — D509 Iron deficiency anemia, unspecified: Secondary | ICD-10-CM | POA: Diagnosis not present

## 2017-08-29 DIAGNOSIS — D631 Anemia in chronic kidney disease: Secondary | ICD-10-CM | POA: Diagnosis not present

## 2017-08-30 DIAGNOSIS — N186 End stage renal disease: Secondary | ICD-10-CM | POA: Diagnosis not present

## 2017-08-30 DIAGNOSIS — Z4789 Encounter for other orthopedic aftercare: Secondary | ICD-10-CM | POA: Diagnosis not present

## 2017-08-30 DIAGNOSIS — I132 Hypertensive heart and chronic kidney disease with heart failure and with stage 5 chronic kidney disease, or end stage renal disease: Secondary | ICD-10-CM | POA: Diagnosis not present

## 2017-08-30 DIAGNOSIS — E1129 Type 2 diabetes mellitus with other diabetic kidney complication: Secondary | ICD-10-CM | POA: Diagnosis not present

## 2017-08-30 DIAGNOSIS — E1122 Type 2 diabetes mellitus with diabetic chronic kidney disease: Secondary | ICD-10-CM | POA: Diagnosis not present

## 2017-08-30 DIAGNOSIS — E1142 Type 2 diabetes mellitus with diabetic polyneuropathy: Secondary | ICD-10-CM | POA: Diagnosis not present

## 2017-08-30 DIAGNOSIS — E1151 Type 2 diabetes mellitus with diabetic peripheral angiopathy without gangrene: Secondary | ICD-10-CM | POA: Diagnosis not present

## 2017-08-30 DIAGNOSIS — Z992 Dependence on renal dialysis: Secondary | ICD-10-CM | POA: Diagnosis not present

## 2017-08-30 DIAGNOSIS — M48062 Spinal stenosis, lumbar region with neurogenic claudication: Secondary | ICD-10-CM | POA: Diagnosis not present

## 2017-08-31 ENCOUNTER — Other Ambulatory Visit: Payer: Self-pay

## 2017-08-31 DIAGNOSIS — E119 Type 2 diabetes mellitus without complications: Secondary | ICD-10-CM | POA: Diagnosis not present

## 2017-08-31 DIAGNOSIS — D631 Anemia in chronic kidney disease: Secondary | ICD-10-CM | POA: Diagnosis not present

## 2017-08-31 DIAGNOSIS — D509 Iron deficiency anemia, unspecified: Secondary | ICD-10-CM | POA: Diagnosis not present

## 2017-08-31 DIAGNOSIS — N2581 Secondary hyperparathyroidism of renal origin: Secondary | ICD-10-CM | POA: Diagnosis not present

## 2017-08-31 DIAGNOSIS — N186 End stage renal disease: Secondary | ICD-10-CM | POA: Diagnosis not present

## 2017-08-31 NOTE — Patient Outreach (Signed)
[place] [facility]) Care Management  08/31/17  [name] [birth year] [id number]  Attempted to reach patient without success. Phone line was busy with no option to leave a message.  [place] R. Birdia Jaycox, RN, BSN, [place] Management Coordinator [phone]

## 2017-09-02 DIAGNOSIS — I132 Hypertensive heart and chronic kidney disease with heart failure and with stage 5 chronic kidney disease, or end stage renal disease: Secondary | ICD-10-CM | POA: Diagnosis not present

## 2017-09-02 DIAGNOSIS — Z4789 Encounter for other orthopedic aftercare: Secondary | ICD-10-CM | POA: Diagnosis not present

## 2017-09-02 DIAGNOSIS — E1142 Type 2 diabetes mellitus with diabetic polyneuropathy: Secondary | ICD-10-CM | POA: Diagnosis not present

## 2017-09-02 DIAGNOSIS — E1151 Type 2 diabetes mellitus with diabetic peripheral angiopathy without gangrene: Secondary | ICD-10-CM | POA: Diagnosis not present

## 2017-09-02 DIAGNOSIS — E1122 Type 2 diabetes mellitus with diabetic chronic kidney disease: Secondary | ICD-10-CM | POA: Diagnosis not present

## 2017-09-02 DIAGNOSIS — M48062 Spinal stenosis, lumbar region with neurogenic claudication: Secondary | ICD-10-CM | POA: Diagnosis not present

## 2017-09-03 DIAGNOSIS — D631 Anemia in chronic kidney disease: Secondary | ICD-10-CM | POA: Diagnosis not present

## 2017-09-03 DIAGNOSIS — N186 End stage renal disease: Secondary | ICD-10-CM | POA: Diagnosis not present

## 2017-09-03 DIAGNOSIS — D509 Iron deficiency anemia, unspecified: Secondary | ICD-10-CM | POA: Diagnosis not present

## 2017-09-03 DIAGNOSIS — N2581 Secondary hyperparathyroidism of renal origin: Secondary | ICD-10-CM | POA: Diagnosis not present

## 2017-09-03 DIAGNOSIS — E119 Type 2 diabetes mellitus without complications: Secondary | ICD-10-CM | POA: Diagnosis not present

## 2017-09-04 DIAGNOSIS — M48062 Spinal stenosis, lumbar region with neurogenic claudication: Secondary | ICD-10-CM | POA: Diagnosis not present

## 2017-09-04 DIAGNOSIS — E1122 Type 2 diabetes mellitus with diabetic chronic kidney disease: Secondary | ICD-10-CM | POA: Diagnosis not present

## 2017-09-04 DIAGNOSIS — I132 Hypertensive heart and chronic kidney disease with heart failure and with stage 5 chronic kidney disease, or end stage renal disease: Secondary | ICD-10-CM | POA: Diagnosis not present

## 2017-09-04 DIAGNOSIS — E1142 Type 2 diabetes mellitus with diabetic polyneuropathy: Secondary | ICD-10-CM | POA: Diagnosis not present

## 2017-09-04 DIAGNOSIS — E1151 Type 2 diabetes mellitus with diabetic peripheral angiopathy without gangrene: Secondary | ICD-10-CM | POA: Diagnosis not present

## 2017-09-04 DIAGNOSIS — Z4789 Encounter for other orthopedic aftercare: Secondary | ICD-10-CM | POA: Diagnosis not present

## 2017-09-05 DIAGNOSIS — D631 Anemia in chronic kidney disease: Secondary | ICD-10-CM | POA: Diagnosis not present

## 2017-09-05 DIAGNOSIS — E119 Type 2 diabetes mellitus without complications: Secondary | ICD-10-CM | POA: Diagnosis not present

## 2017-09-05 DIAGNOSIS — N186 End stage renal disease: Secondary | ICD-10-CM | POA: Diagnosis not present

## 2017-09-05 DIAGNOSIS — N2581 Secondary hyperparathyroidism of renal origin: Secondary | ICD-10-CM | POA: Diagnosis not present

## 2017-09-05 DIAGNOSIS — D509 Iron deficiency anemia, unspecified: Secondary | ICD-10-CM | POA: Diagnosis not present

## 2017-09-06 ENCOUNTER — Other Ambulatory Visit: Payer: Self-pay
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
## 2017-09-07 DIAGNOSIS — N186 End stage renal disease: Secondary | ICD-10-CM | POA: Diagnosis not present

## 2017-09-07 DIAGNOSIS — E119 Type 2 diabetes mellitus without complications: Secondary | ICD-10-CM | POA: Diagnosis not present

## 2017-09-07 DIAGNOSIS — N2581 Secondary hyperparathyroidism of renal origin: Secondary | ICD-10-CM | POA: Diagnosis not present

## 2017-09-07 DIAGNOSIS — D631 Anemia in chronic kidney disease: Secondary | ICD-10-CM | POA: Diagnosis not present

## 2017-09-07 DIAGNOSIS — D509 Iron deficiency anemia, unspecified: Secondary | ICD-10-CM | POA: Diagnosis not present

## 2017-09-09 ENCOUNTER — Other Ambulatory Visit: Payer: Self-pay
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
## 2017-09-10 DIAGNOSIS — E1142 Type 2 diabetes mellitus with diabetic polyneuropathy: Secondary | ICD-10-CM | POA: Diagnosis not present

## 2017-09-10 DIAGNOSIS — E1122 Type 2 diabetes mellitus with diabetic chronic kidney disease: Secondary | ICD-10-CM | POA: Diagnosis not present

## 2017-09-10 DIAGNOSIS — M48062 Spinal stenosis, lumbar region with neurogenic claudication: Secondary | ICD-10-CM | POA: Diagnosis not present

## 2017-09-10 DIAGNOSIS — E1151 Type 2 diabetes mellitus with diabetic peripheral angiopathy without gangrene: Secondary | ICD-10-CM | POA: Diagnosis not present

## 2017-09-10 DIAGNOSIS — I132 Hypertensive heart and chronic kidney disease with heart failure and with stage 5 chronic kidney disease, or end stage renal disease: Secondary | ICD-10-CM | POA: Diagnosis not present

## 2017-09-10 DIAGNOSIS — Z4789 Encounter for other orthopedic aftercare: Secondary | ICD-10-CM | POA: Diagnosis not present

## 2017-09-11 ENCOUNTER — Other Ambulatory Visit: Payer: Self-pay | Admitting: Pharmacist

## 2017-09-11 DIAGNOSIS — E119 Type 2 diabetes mellitus without complications: Secondary | ICD-10-CM | POA: Diagnosis not present

## 2017-09-11 DIAGNOSIS — D631 Anemia in chronic kidney disease: Secondary | ICD-10-CM | POA: Diagnosis not present

## 2017-09-11 DIAGNOSIS — D509 Iron deficiency anemia, unspecified: Secondary | ICD-10-CM | POA: Diagnosis not present

## 2017-09-11 DIAGNOSIS — N186 End stage renal disease: Secondary | ICD-10-CM | POA: Diagnosis not present

## 2017-09-11 DIAGNOSIS — N2581 Secondary hyperparathyroidism of renal origin: Secondary | ICD-10-CM | POA: Diagnosis not present

## 2017-09-11 NOTE — Patient Outreach (Signed)
[facility]) Care Management  09/11/2017  MC BLOODWORTH [birth year] [id number]  Patient was referred to [facility] Pharmacist by Reiffton S for patient assistance evaluation for [facility] and ezetimibe.    Successful phone outreach to patient, HIPAA details verified, signed [facility] consent in chart from 05/2017, purpose of call explained to patient.    Patient reports only medications of cost concern are ezetimibe and Welchol---reports his cinacalcet and Fosrenol are taken care of at dialysis center.    Patient assistance:  Discussed Social Security Admin Extra Help---patient doesn't think he would qualify.   -Welchol---manufacturer does not accept Medicare Part D beneficiaries.  Appears to be Tier 3 co-pay until patient reaches coverage gap.   -Ezetimibe---[employer] patient assistance---discussed income requirements and application process---he would like to apply to see if eligible.    Patient reports Dr [name] is prescriber of ezetimibe.    Plan:  Address in chart verified---will have [place] send patient application.    Will have pharmacy technician follow-up with patient---he is aware of this.   Karrie Meres, PharmD, [place] [phone]

## 2017-09-12 DIAGNOSIS — E119 Type 2 diabetes mellitus without complications: Secondary | ICD-10-CM | POA: Diagnosis not present

## 2017-09-12 DIAGNOSIS — D509 Iron deficiency anemia, unspecified: Secondary | ICD-10-CM | POA: Diagnosis not present

## 2017-09-12 DIAGNOSIS — N186 End stage renal disease: Secondary | ICD-10-CM | POA: Diagnosis not present

## 2017-09-12 DIAGNOSIS — D631 Anemia in chronic kidney disease: Secondary | ICD-10-CM | POA: Diagnosis not present

## 2017-09-12 DIAGNOSIS — N2581 Secondary hyperparathyroidism of renal origin: Secondary | ICD-10-CM | POA: Diagnosis not present

## 2017-09-14 DIAGNOSIS — D509 Iron deficiency anemia, unspecified: Secondary | ICD-10-CM | POA: Diagnosis not present

## 2017-09-14 DIAGNOSIS — E119 Type 2 diabetes mellitus without complications: Secondary | ICD-10-CM | POA: Diagnosis not present

## 2017-09-14 DIAGNOSIS — N186 End stage renal disease: Secondary | ICD-10-CM | POA: Diagnosis not present

## 2017-09-14 DIAGNOSIS — D631 Anemia in chronic kidney disease: Secondary | ICD-10-CM | POA: Diagnosis not present

## 2017-09-14 DIAGNOSIS — N2581 Secondary hyperparathyroidism of renal origin: Secondary | ICD-10-CM | POA: Diagnosis not present

## 2017-09-17 DIAGNOSIS — D631 Anemia in chronic kidney disease: Secondary | ICD-10-CM | POA: Diagnosis not present

## 2017-09-17 DIAGNOSIS — D509 Iron deficiency anemia, unspecified: Secondary | ICD-10-CM | POA: Diagnosis not present

## 2017-09-17 DIAGNOSIS — N2581 Secondary hyperparathyroidism of renal origin: Secondary | ICD-10-CM | POA: Diagnosis not present

## 2017-09-17 DIAGNOSIS — N186 End stage renal disease: Secondary | ICD-10-CM | POA: Diagnosis not present

## 2017-09-17 DIAGNOSIS — E119 Type 2 diabetes mellitus without complications: Secondary | ICD-10-CM | POA: Diagnosis not present

## 2017-09-18 DIAGNOSIS — Z4789 Encounter for other orthopedic aftercare: Secondary | ICD-10-CM | POA: Diagnosis not present

## 2017-09-18 DIAGNOSIS — E1151 Type 2 diabetes mellitus with diabetic peripheral angiopathy without gangrene: Secondary | ICD-10-CM | POA: Diagnosis not present

## 2017-09-18 DIAGNOSIS — M48062 Spinal stenosis, lumbar region with neurogenic claudication: Secondary | ICD-10-CM | POA: Diagnosis not present

## 2017-09-18 DIAGNOSIS — E1122 Type 2 diabetes mellitus with diabetic chronic kidney disease: Secondary | ICD-10-CM | POA: Diagnosis not present

## 2017-09-18 DIAGNOSIS — E1142 Type 2 diabetes mellitus with diabetic polyneuropathy: Secondary | ICD-10-CM | POA: Diagnosis not present

## 2017-09-18 DIAGNOSIS — I132 Hypertensive heart and chronic kidney disease with heart failure and with stage 5 chronic kidney disease, or end stage renal disease: Secondary | ICD-10-CM | POA: Diagnosis not present

## 2017-09-19 DIAGNOSIS — D509 Iron deficiency anemia, unspecified: Secondary | ICD-10-CM | POA: Diagnosis not present

## 2017-09-19 DIAGNOSIS — N186 End stage renal disease: Secondary | ICD-10-CM | POA: Diagnosis not present

## 2017-09-19 DIAGNOSIS — N2581 Secondary hyperparathyroidism of renal origin: Secondary | ICD-10-CM | POA: Diagnosis not present

## 2017-09-19 DIAGNOSIS — E119 Type 2 diabetes mellitus without complications: Secondary | ICD-10-CM | POA: Diagnosis not present

## 2017-09-19 DIAGNOSIS — D631 Anemia in chronic kidney disease: Secondary | ICD-10-CM | POA: Diagnosis not present

## 2017-09-20 DIAGNOSIS — I132 Hypertensive heart and chronic kidney disease with heart failure and with stage 5 chronic kidney disease, or end stage renal disease: Secondary | ICD-10-CM | POA: Diagnosis not present

## 2017-09-20 DIAGNOSIS — E1122 Type 2 diabetes mellitus with diabetic chronic kidney disease: Secondary | ICD-10-CM | POA: Diagnosis not present

## 2017-09-20 DIAGNOSIS — E1151 Type 2 diabetes mellitus with diabetic peripheral angiopathy without gangrene: Secondary | ICD-10-CM | POA: Diagnosis not present

## 2017-09-20 DIAGNOSIS — E1142 Type 2 diabetes mellitus with diabetic polyneuropathy: Secondary | ICD-10-CM | POA: Diagnosis not present

## 2017-09-20 DIAGNOSIS — Z4789 Encounter for other orthopedic aftercare: Secondary | ICD-10-CM | POA: Diagnosis not present

## 2017-09-20 DIAGNOSIS — M48062 Spinal stenosis, lumbar region with neurogenic claudication: Secondary | ICD-10-CM | POA: Diagnosis not present

## 2017-09-21 DIAGNOSIS — N2581 Secondary hyperparathyroidism of renal origin: Secondary | ICD-10-CM | POA: Diagnosis not present

## 2017-09-21 DIAGNOSIS — D631 Anemia in chronic kidney disease: Secondary | ICD-10-CM | POA: Diagnosis not present

## 2017-09-21 DIAGNOSIS — D509 Iron deficiency anemia, unspecified: Secondary | ICD-10-CM | POA: Diagnosis not present

## 2017-09-21 DIAGNOSIS — N186 End stage renal disease: Secondary | ICD-10-CM | POA: Diagnosis not present

## 2017-09-21 DIAGNOSIS — E119 Type 2 diabetes mellitus without complications: Secondary | ICD-10-CM | POA: Diagnosis not present

## 2017-09-23 DIAGNOSIS — D509 Iron deficiency anemia, unspecified: Secondary | ICD-10-CM | POA: Diagnosis not present

## 2017-09-23 DIAGNOSIS — E119 Type 2 diabetes mellitus without complications: Secondary | ICD-10-CM | POA: Diagnosis not present

## 2017-09-23 DIAGNOSIS — D631 Anemia in chronic kidney disease: Secondary | ICD-10-CM | POA: Diagnosis not present

## 2017-09-23 DIAGNOSIS — N2581 Secondary hyperparathyroidism of renal origin: Secondary | ICD-10-CM | POA: Diagnosis not present

## 2017-09-23 DIAGNOSIS — N186 End stage renal disease: Secondary | ICD-10-CM | POA: Diagnosis not present

## 2017-09-26 DIAGNOSIS — D631 Anemia in chronic kidney disease: Secondary | ICD-10-CM | POA: Diagnosis not present

## 2017-09-26 DIAGNOSIS — N186 End stage renal disease: Secondary | ICD-10-CM | POA: Diagnosis not present

## 2017-09-26 DIAGNOSIS — N2581 Secondary hyperparathyroidism of renal origin: Secondary | ICD-10-CM | POA: Diagnosis not present

## 2017-09-26 DIAGNOSIS — D509 Iron deficiency anemia, unspecified: Secondary | ICD-10-CM | POA: Diagnosis not present

## 2017-09-26 DIAGNOSIS — E119 Type 2 diabetes mellitus without complications: Secondary | ICD-10-CM | POA: Diagnosis not present

## 2017-09-27 DIAGNOSIS — M48062 Spinal stenosis, lumbar region with neurogenic claudication: Secondary | ICD-10-CM | POA: Diagnosis not present

## 2017-09-27 DIAGNOSIS — Z4789 Encounter for other orthopedic aftercare: Secondary | ICD-10-CM | POA: Diagnosis not present

## 2017-09-27 DIAGNOSIS — E1122 Type 2 diabetes mellitus with diabetic chronic kidney disease: Secondary | ICD-10-CM | POA: Diagnosis not present

## 2017-09-27 DIAGNOSIS — I132 Hypertensive heart and chronic kidney disease with heart failure and with stage 5 chronic kidney disease, or end stage renal disease: Secondary | ICD-10-CM | POA: Diagnosis not present

## 2017-09-27 DIAGNOSIS — E1151 Type 2 diabetes mellitus with diabetic peripheral angiopathy without gangrene: Secondary | ICD-10-CM | POA: Diagnosis not present

## 2017-09-27 DIAGNOSIS — E1142 Type 2 diabetes mellitus with diabetic polyneuropathy: Secondary | ICD-10-CM | POA: Diagnosis not present

## 2017-09-28 DIAGNOSIS — N186 End stage renal disease: Secondary | ICD-10-CM | POA: Diagnosis not present

## 2017-09-28 DIAGNOSIS — N2581 Secondary hyperparathyroidism of renal origin: Secondary | ICD-10-CM | POA: Diagnosis not present

## 2017-09-28 DIAGNOSIS — D631 Anemia in chronic kidney disease: Secondary | ICD-10-CM | POA: Diagnosis not present

## 2017-09-28 DIAGNOSIS — E119 Type 2 diabetes mellitus without complications: Secondary | ICD-10-CM | POA: Diagnosis not present

## 2017-09-28 DIAGNOSIS — D509 Iron deficiency anemia, unspecified: Secondary | ICD-10-CM | POA: Diagnosis not present

## 2017-09-30 DIAGNOSIS — Z992 Dependence on renal dialysis: Secondary | ICD-10-CM | POA: Diagnosis not present

## 2017-09-30 DIAGNOSIS — N2581 Secondary hyperparathyroidism of renal origin: Secondary | ICD-10-CM | POA: Diagnosis not present

## 2017-09-30 DIAGNOSIS — D631 Anemia in chronic kidney disease: Secondary | ICD-10-CM | POA: Diagnosis not present

## 2017-09-30 DIAGNOSIS — N186 End stage renal disease: Secondary | ICD-10-CM | POA: Diagnosis not present

## 2017-09-30 DIAGNOSIS — E119 Type 2 diabetes mellitus without complications: Secondary | ICD-10-CM | POA: Diagnosis not present

## 2017-09-30 DIAGNOSIS — D509 Iron deficiency anemia, unspecified: Secondary | ICD-10-CM | POA: Diagnosis not present

## 2017-09-30 DIAGNOSIS — E1129 Type 2 diabetes mellitus with other diabetic kidney complication: Secondary | ICD-10-CM | POA: Diagnosis not present

## 2017-10-01 DIAGNOSIS — E1122 Type 2 diabetes mellitus with diabetic chronic kidney disease: Secondary | ICD-10-CM | POA: Diagnosis not present

## 2017-10-01 DIAGNOSIS — E1151 Type 2 diabetes mellitus with diabetic peripheral angiopathy without gangrene: Secondary | ICD-10-CM | POA: Diagnosis not present

## 2017-10-01 DIAGNOSIS — M5412 Radiculopathy, cervical region: Secondary | ICD-10-CM | POA: Diagnosis not present

## 2017-10-01 DIAGNOSIS — E1142 Type 2 diabetes mellitus with diabetic polyneuropathy: Secondary | ICD-10-CM | POA: Diagnosis not present

## 2017-10-01 DIAGNOSIS — M519 Unspecified thoracic, thoracolumbar and lumbosacral intervertebral disc disorder: Secondary | ICD-10-CM | POA: Diagnosis not present

## 2017-10-01 DIAGNOSIS — M48062 Spinal stenosis, lumbar region with neurogenic claudication: Secondary | ICD-10-CM | POA: Diagnosis not present

## 2017-10-02 DIAGNOSIS — E1151 Type 2 diabetes mellitus with diabetic peripheral angiopathy without gangrene: Secondary | ICD-10-CM | POA: Diagnosis not present

## 2017-10-02 DIAGNOSIS — E1142 Type 2 diabetes mellitus with diabetic polyneuropathy: Secondary | ICD-10-CM | POA: Diagnosis not present

## 2017-10-02 DIAGNOSIS — M5412 Radiculopathy, cervical region: Secondary | ICD-10-CM | POA: Diagnosis not present

## 2017-10-02 DIAGNOSIS — E1122 Type 2 diabetes mellitus with diabetic chronic kidney disease: Secondary | ICD-10-CM | POA: Diagnosis not present

## 2017-10-02 DIAGNOSIS — M519 Unspecified thoracic, thoracolumbar and lumbosacral intervertebral disc disorder: Secondary | ICD-10-CM | POA: Diagnosis not present

## 2017-10-02 DIAGNOSIS — M48062 Spinal stenosis, lumbar region with neurogenic claudication: Secondary | ICD-10-CM | POA: Diagnosis not present

## 2017-10-03 ENCOUNTER — Other Ambulatory Visit: Payer: Self-pay | Admitting: Pharmacy Technician

## 2017-10-03 DIAGNOSIS — D509 Iron deficiency anemia, unspecified: Secondary | ICD-10-CM | POA: Diagnosis not present

## 2017-10-03 DIAGNOSIS — N2581 Secondary hyperparathyroidism of renal origin: Secondary | ICD-10-CM | POA: Diagnosis not present

## 2017-10-03 DIAGNOSIS — N186 End stage renal disease: Secondary | ICD-10-CM | POA: Diagnosis not present

## 2017-10-03 DIAGNOSIS — D631 Anemia in chronic kidney disease: Secondary | ICD-10-CM | POA: Diagnosis not present

## 2017-10-03 NOTE — Patient Outreach (Signed)
[facility]) Care Management  10/03/2017  [name] 1951-08-06 [id number]  Patient outreach call to follow up on [employer] patient assistance application that was mailed to the patient 09/16/2017. Patient was unavailable but has a DPR that allows me to discuss his health information with his spouse [name]. She states she has not seen the application but she is going to look for it and call me back. If the application has not been received I will mail another application to their home or schedule a home visit to take the application to them and help with completion.  [name], [place] [phone]

## 2017-10-04 ENCOUNTER — Other Ambulatory Visit: Payer: Self-pay | Admitting: Internal Medicine

## 2017-10-04 DIAGNOSIS — E1122 Type 2 diabetes mellitus with diabetic chronic kidney disease: Secondary | ICD-10-CM | POA: Diagnosis not present

## 2017-10-04 DIAGNOSIS — M519 Unspecified thoracic, thoracolumbar and lumbosacral intervertebral disc disorder: Secondary | ICD-10-CM | POA: Diagnosis not present

## 2017-10-04 DIAGNOSIS — E1151 Type 2 diabetes mellitus with diabetic peripheral angiopathy without gangrene: Secondary | ICD-10-CM | POA: Diagnosis not present

## 2017-10-04 DIAGNOSIS — E1142 Type 2 diabetes mellitus with diabetic polyneuropathy: Secondary | ICD-10-CM | POA: Diagnosis not present

## 2017-10-04 DIAGNOSIS — M5412 Radiculopathy, cervical region: Secondary | ICD-10-CM | POA: Diagnosis not present

## 2017-10-04 DIAGNOSIS — M48062 Spinal stenosis, lumbar region with neurogenic claudication: Secondary | ICD-10-CM | POA: Diagnosis not present

## 2017-10-05 DIAGNOSIS — N2581 Secondary hyperparathyroidism of renal origin: Secondary | ICD-10-CM | POA: Diagnosis not present

## 2017-10-05 DIAGNOSIS — D631 Anemia in chronic kidney disease: Secondary | ICD-10-CM | POA: Diagnosis not present

## 2017-10-05 DIAGNOSIS — D509 Iron deficiency anemia, unspecified: Secondary | ICD-10-CM | POA: Diagnosis not present

## 2017-10-05 DIAGNOSIS — N186 End stage renal disease: Secondary | ICD-10-CM | POA: Diagnosis not present

## 2017-10-07 DIAGNOSIS — M5412 Radiculopathy, cervical region: Secondary | ICD-10-CM | POA: Diagnosis not present

## 2017-10-07 DIAGNOSIS — E1142 Type 2 diabetes mellitus with diabetic polyneuropathy: Secondary | ICD-10-CM | POA: Diagnosis not present

## 2017-10-07 DIAGNOSIS — M48062 Spinal stenosis, lumbar region with neurogenic claudication: Secondary | ICD-10-CM | POA: Diagnosis not present

## 2017-10-07 DIAGNOSIS — M519 Unspecified thoracic, thoracolumbar and lumbosacral intervertebral disc disorder: Secondary | ICD-10-CM | POA: Diagnosis not present

## 2017-10-07 DIAGNOSIS — E1122 Type 2 diabetes mellitus with diabetic chronic kidney disease: Secondary | ICD-10-CM | POA: Diagnosis not present

## 2017-10-07 DIAGNOSIS — E1151 Type 2 diabetes mellitus with diabetic peripheral angiopathy without gangrene: Secondary | ICD-10-CM | POA: Diagnosis not present

## 2017-10-08 DIAGNOSIS — N2581 Secondary hyperparathyroidism of renal origin: Secondary | ICD-10-CM | POA: Diagnosis not present

## 2017-10-08 DIAGNOSIS — D509 Iron deficiency anemia, unspecified: Secondary | ICD-10-CM | POA: Diagnosis not present

## 2017-10-08 DIAGNOSIS — N186 End stage renal disease: Secondary | ICD-10-CM | POA: Diagnosis not present

## 2017-10-08 DIAGNOSIS — D631 Anemia in chronic kidney disease: Secondary | ICD-10-CM | POA: Diagnosis not present

## 2017-10-09 ENCOUNTER — Other Ambulatory Visit: Payer: Self-pay | Admitting: Pharmacy Technician

## 2017-10-09 NOTE — Patient Outreach (Signed)
[place] [facility]) Care Management  10/09/2017  [name] 11/03/1950 [id number]  Successful follow-up call in reference to previous conversation that I had with [name] in reference to previous call from last week. HIPAA identifiers verified and verbal consent received. When I last spoke with the patient she was going to look for the application that was mailed to her husband last month and inform me whether or not it was received. Since I have not heard from her I contacted her and she states that they did not get the application. I verified that I have the correct address and will mail the application to their home again. I will follow-up with the patient or his wife next week.  [name], [place] [phone]

## 2017-10-10 DIAGNOSIS — D509 Iron deficiency anemia, unspecified: Secondary | ICD-10-CM | POA: Diagnosis not present

## 2017-10-10 DIAGNOSIS — N2581 Secondary hyperparathyroidism of renal origin: Secondary | ICD-10-CM | POA: Diagnosis not present

## 2017-10-10 DIAGNOSIS — D631 Anemia in chronic kidney disease: Secondary | ICD-10-CM | POA: Diagnosis not present

## 2017-10-10 DIAGNOSIS — N186 End stage renal disease: Secondary | ICD-10-CM | POA: Diagnosis not present

## 2017-10-11 DIAGNOSIS — M5412 Radiculopathy, cervical region: Secondary | ICD-10-CM | POA: Diagnosis not present

## 2017-10-11 DIAGNOSIS — E1142 Type 2 diabetes mellitus with diabetic polyneuropathy: Secondary | ICD-10-CM | POA: Diagnosis not present

## 2017-10-11 DIAGNOSIS — M48062 Spinal stenosis, lumbar region with neurogenic claudication: Secondary | ICD-10-CM | POA: Diagnosis not present

## 2017-10-11 DIAGNOSIS — M519 Unspecified thoracic, thoracolumbar and lumbosacral intervertebral disc disorder: Secondary | ICD-10-CM | POA: Diagnosis not present

## 2017-10-11 DIAGNOSIS — E1122 Type 2 diabetes mellitus with diabetic chronic kidney disease: Secondary | ICD-10-CM | POA: Diagnosis not present

## 2017-10-11 DIAGNOSIS — E1151 Type 2 diabetes mellitus with diabetic peripheral angiopathy without gangrene: Secondary | ICD-10-CM | POA: Diagnosis not present

## 2017-10-12 DIAGNOSIS — N186 End stage renal disease: Secondary | ICD-10-CM | POA: Diagnosis not present

## 2017-10-12 DIAGNOSIS — D631 Anemia in chronic kidney disease: Secondary | ICD-10-CM | POA: Diagnosis not present

## 2017-10-12 DIAGNOSIS — D509 Iron deficiency anemia, unspecified: Secondary | ICD-10-CM | POA: Diagnosis not present

## 2017-10-12 DIAGNOSIS — N2581 Secondary hyperparathyroidism of renal origin: Secondary | ICD-10-CM | POA: Diagnosis not present

## 2017-10-14 ENCOUNTER — Ambulatory Visit (INDEPENDENT_AMBULATORY_CARE_PROVIDER_SITE_OTHER): Payer: Medicare Other | Admitting: Internal Medicine

## 2017-10-14 ENCOUNTER — Encounter: Payer: Self-pay | Admitting: Internal Medicine

## 2017-10-14 ENCOUNTER — Other Ambulatory Visit: Payer: Self-pay | Admitting: Internal Medicine

## 2017-10-14 VITALS — BP 128/84 | HR 96 | Temp 97.9°F | Ht 73.0 in | Wt 223.0 lb

## 2017-10-14 DIAGNOSIS — G4700 Insomnia, unspecified: Secondary | ICD-10-CM | POA: Diagnosis not present

## 2017-10-14 DIAGNOSIS — E1142 Type 2 diabetes mellitus with diabetic polyneuropathy: Secondary | ICD-10-CM | POA: Diagnosis not present

## 2017-10-14 DIAGNOSIS — M5412 Radiculopathy, cervical region: Secondary | ICD-10-CM | POA: Diagnosis not present

## 2017-10-14 DIAGNOSIS — F329 Major depressive disorder, single episode, unspecified: Secondary | ICD-10-CM | POA: Diagnosis not present

## 2017-10-14 DIAGNOSIS — E1151 Type 2 diabetes mellitus with diabetic peripheral angiopathy without gangrene: Secondary | ICD-10-CM | POA: Diagnosis not present

## 2017-10-14 DIAGNOSIS — L02416 Cutaneous abscess of left lower limb: Secondary | ICD-10-CM

## 2017-10-14 DIAGNOSIS — M48062 Spinal stenosis, lumbar region with neurogenic claudication: Secondary | ICD-10-CM | POA: Diagnosis not present

## 2017-10-14 DIAGNOSIS — E1121 Type 2 diabetes mellitus with diabetic nephropathy: Secondary | ICD-10-CM

## 2017-10-14 DIAGNOSIS — M519 Unspecified thoracic, thoracolumbar and lumbosacral intervertebral disc disorder: Secondary | ICD-10-CM | POA: Diagnosis not present

## 2017-10-14 DIAGNOSIS — E1122 Type 2 diabetes mellitus with diabetic chronic kidney disease: Secondary | ICD-10-CM | POA: Diagnosis not present

## 2017-10-14 DIAGNOSIS — F32A Depression, unspecified: Secondary | ICD-10-CM

## 2017-10-14 MED ORDER — ZOLPIDEM TARTRATE 5 MG PO TABS: 5.0000 mg | ORAL_TABLET | Freq: Every evening | ORAL | 1 refills | Status: DC | PRN

## 2017-10-14 MED ORDER — DOXYCYCLINE HYCLATE 100 MG PO TABS: 100.0000 mg | ORAL_TABLET | Freq: Two times a day (BID) | ORAL | 0 refills | Status: DC

## 2017-10-14 MED ORDER — CITALOPRAM HYDROBROMIDE 10 MG PO TABS: 10.0000 mg | ORAL_TABLET | Freq: Every day | ORAL | 3 refills | Status: DC

## 2017-10-14 NOTE — Patient Instructions (Signed)
Please take all new medication as prescribed - the antibiotic, ambien for sleep as needed, and restarting the celexa 10 mg  Please call if you develop a wound to the sore area, for a referral to Wound Clinic  Please continue all other medications as before, and refills have been done if requested.  Please have the pharmacy call with any other refills you may need.  Please keep your appointments with your specialists as you may have planned

## 2017-10-14 NOTE — Assessment & Plan Note (Signed)
Improved, cont same tx 

## 2017-10-14 NOTE — Progress Notes (Signed)
Subjective:    Patient ID: [name], male    DOB: March 14, 1951, 67 y.o.   MRN: [id number]  HPI  Here with left post leg area to distal third at about the prox start achilles tendon with red, tender, swelling x 3 days without red streaks or drainage.  Mild, sharp pain, constant, Nothing seems to make better or worse.  Pt denies chest pain, increased sob or doe, wheezing, orthopnea, PND, increased LE swelling, palpitations, dizziness or syncope.  Has had mild worsening depressive symptoms, but no suicidal ideation, or panic;  Also with worsening sleep in last month, hard to get to sleep, melatonin not working.   No other new hx or interval change Past Medical History:  Diagnosis Date  . Allergic rhinitis, cause unspecified 02/24/2014  . Anemia 06/16/2011  . BENIGN PROSTATIC HYPERTROPHY 10/14/2009  . CAD, NATIVE VESSEL 02/06/2009   a. 06/2007 s/p Taxus DES to the RCA;  b. 08/2016 NSTEMI in setting of SVT/PCI: LM 30ost, LAD 35m (3.0x16 Synergy DES), LCX 5m, OM1 60, RI 40, RCA 70p/m, 61m - not amenable to PCI.  [name] Cervical radiculopathy, chronic 02/23/2016   Right c5-6 by NCS/EMG  . CHEST PAIN 03/29/2010  . Chronic combined systolic (congestive) and diastolic (congestive) heart failure ([place])    a. 10/2016 Echo: EF 40-45%, Gr2 DD. mildly dil LA.  [name] COLONIC POLYPS, HX OF 10/14/2009  . Dementia [id number]  . DEPRESSION 10/14/2009  . Depression 09/24/2015  . DIABETES MELLITUS, TYPE II 02/01/2010  . DIZZINESS 07/17/2010  . DYSLIPIDEMIA 06/18/2007  . ESRD (end stage renal disease) on dialysis ([place]) 08/04/2010   "TTS;  " (04/18/2015)  . FOOT PAIN 08/12/2008  . GAIT DISTURBANCE 03/03/2010  . GASTROENTERITIS, VIRAL 10/14/2009  . GERD 06/18/2007  . GOITER, MULTINODULAR 12/26/2007  . GOUT 06/18/2007  . GYNECOMASTIA 07/17/2010  . Hemodialysis access, fistula mature [facility])    Dialysis T-Th-Sa ([place]) Right upper arm fistula  . Hyperlipidemia 10/16/2011  . Hyperparathyroidism, secondary ([place])  06/16/2011  . HYPERTENSION 06/18/2007  . Hyperthyroidism   . Hypocalcemia 06/07/2010  . Ischemic cardiomyopathy    a. 10/2016 Echo: EF 40-45%.  . Lumbar stenosis with neurogenic claudication   . NECK PAIN 07/31/2010  . ONYCHOMYCOSIS, TOENAILS 12/26/2007  . OSA on CPAP 10/16/2011  . Other malaise and fatigue 11/24/2009  . PERIPHERAL NEUROPATHY 06/18/2007  . Prostate cancer ([place])   . PSVT (paroxysmal supraventricular tachycardia) ([place])    a. 37/4827 complicated by NSTEMI;  b. 11/2016 Treated w/ adenosine in ED;  c. 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  [name] PULMONARY NODULE, RIGHT LOWER LOBE 06/08/2009  . Sleep apnea    cpap machine and o2  . TRANSAMINASES, SERUM, ELEVATED 02/01/2010  . Transfusion history    none recent  . Unspecified hypotension 01/30/2010   Past Surgical History:  Procedure Laterality Date  . ARTERIOVENOUS GRAFT PLACEMENT Right 2009   forearm/notes 02/01/2011  . AV FISTULA PLACEMENT  11/07/2011   Procedure: INSERTION OF ARTERIOVENOUS (AV) GORE-TEX GRAFT ARM;  Surgeon: Tinnie Gens, MD;  Location: [place];  Service: Vascular;  Laterality: Left;  . BACK SURGERY  1998  . BASCILIC VEIN TRANSPOSITION Right 02/27/2013   Procedure: BASCILIC VEIN TRANSPOSITION;  Surgeon: Mal Misty, MD;  Location: [place];  Service: Vascular;  Laterality: Right;  Right Basilic Vein Transposition   . CARDIAC CATHETERIZATION N/A 08/06/2016   Procedure: Left Heart Cath and Coronary Angiography;  Surgeon: Jolaine Artist, MD;  Location: [facility] INVASIVE CV  LAB;  Service: Cardiovascular;  Laterality: N/A;  . CARDIAC CATHETERIZATION N/A 08/07/2016   Procedure: Coronary/Graft Atherectomy-CSI LAD;  Surgeon: Peter M Martinique, MD;  Location: [place] CV LAB;  Service: Cardiovascular;  Laterality: N/A;  . CERVICAL SPINE SURGERY  2/09   "to repair nerve problems in my left arm"  . CHOLECYSTECTOMY    . COLONOSCOPY WITH PROPOFOL N/A 04/26/2017   Procedure: COLONOSCOPY WITH PROPOFOL;  Surgeon: Otis Brace, MD;  Location: [place];  Service: Gastroenterology;  Laterality: N/A;  . CORONARY ANGIOPLASTY WITH STENT PLACEMENT  06/11/2008  . CORONARY ANGIOPLASTY WITH STENT PLACEMENT  06/2007   TAXUS stent to RCA/notes 01/31/2011  . ESOPHAGOGASTRODUODENOSCOPY  09/28/2011   Procedure: ESOPHAGOGASTRODUODENOSCOPY (EGD);  Surgeon: Missy Sabins, MD;  Location: [facility] ENDOSCOPY;  Service: Endoscopy;  Laterality: N/A;  . ESOPHAGOGASTRODUODENOSCOPY N/A 04/07/2015   Procedure: ESOPHAGOGASTRODUODENOSCOPY (EGD);  Surgeon: Teena Irani, MD;  Location: [facility];  Service: Endoscopy;  Laterality: N/A;  . ESOPHAGOGASTRODUODENOSCOPY N/A 04/19/2015   Procedure: ESOPHAGOGASTRODUODENOSCOPY (EGD);  Surgeon: Arta Silence, MD;  Location: [facility] ENDOSCOPY;  Service: Endoscopy;  Laterality: N/A;  . FLEXIBLE SIGMOIDOSCOPY N/A 05/21/2017   Procedure: Conni Elliot;  Surgeon: Clarene Essex, MD;  Location: [place];  Service: Endoscopy;  Laterality: N/A;  . FLEXIBLE SIGMOIDOSCOPY Left 07/02/2017   Procedure: FLEXIBLE SIGMOIDOSCOPY;  Surgeon: Laurence Spates, MD;  Location: [place];  Service: Endoscopy;  Laterality: Left;  . FOREIGN BODY REMOVAL  09/2003   via upper endoscopy/notes 02/12/2011  . GIVENS CAPSULE STUDY  09/30/2011   Procedure: GIVENS CAPSULE STUDY;  Surgeon: Jeryl Columbia, MD;  Location: [facility] ENDOSCOPY;  Service: Endoscopy;  Laterality: N/A;  . INSERTION OF DIALYSIS CATHETER Right 2014  . INSERTION OF DIALYSIS CATHETER Left 02/11/2013   Procedure: INSERTION OF DIALYSIS CATHETER;  Surgeon: Conrad Mayfield, MD;  Location: [place];  Service: Vascular;  Laterality: Left;  Ultrasound guided  . LUMBAR LAMINECTOMY/DECOMPRESSION MICRODISCECTOMY Bilateral 07/31/2017   Procedure: LAMINECTOMY AND FORAMINOTOMY- BILATERAL LUMBAR TWO- LUMBAR THREE;  Surgeon: Earnie Larsson, MD;  Location: [place];  Service: Neurosurgery;  Laterality: Bilateral;  LAMINECTOMY AND FORAMINOTOMY- BILATERAL LUMBAR 2- LUMBAR 3  . REMOVAL OF A DIALYSIS CATHETER Right 02/11/2013    Procedure: REMOVAL OF A DIALYSIS CATHETER;  Surgeon: Conrad Funston, MD;  Location: [place];  Service: Vascular;  Laterality: Right;  . SAVORY DILATION N/A 04/07/2015   Procedure: SAVORY DILATION;  Surgeon: Teena Irani, MD;  Location: [facility] ENDOSCOPY;  Service: Endoscopy;  Laterality: N/A;  . SHUNTOGRAM N/A 09/20/2011   Procedure: Earney Mallet;  Surgeon: Conrad Morganfield, MD;  Location: [facility] CATH LAB;  Service: Cardiovascular;  Laterality: N/A;  . SVT ABLATION N/A 11/26/2016   Procedure: SVT Ablation;  Surgeon: Will Meredith Leeds, MD;  Location: [place] CV LAB;  Service: Cardiovascular;  Laterality: N/A;  . TONSILLECTOMY    . TOTAL KNEE ARTHROPLASTY Right 08/02/2015   Procedure: TOTAL KNEE ARTHROPLASTY;  Surgeon: Renette Butters, MD;  Location: [place];  Service: Orthopedics;  Laterality: Right;  . VENOGRAM N/A 01/26/2013   Procedure: VENOGRAM;  Surgeon: Angelia Mould, MD;  Location: [facility] CATH LAB;  Service: Cardiovascular;  Laterality: N/A;    reports that he quit smoking about 12 years ago. His smoking use included cigarettes. He has a 25.00 pack-year smoking history. He quit smokeless tobacco use about 12 years ago. He reports that he does not drink alcohol or use drugs. family history includes Diabetes in his father; Healthy in his child, child, and child;  Heart disease in his father and sister; Hypertension in his father; Kidney failure in his father; Thyroid nodules in his sister. Allergies  Allergen Reactions  . Cephalexin Swelling and Other (See Comments)    Tongue swelling  . Statins Other (See Comments)    Weak muscles  . Ciprofloxacin Rash   Current Outpatient Medications on File Prior to Visit  Medication Sig Dispense Refill  . acetaminophen (TYLENOL) 325 MG tablet Take 650 mg by mouth every 6 (six) hours as needed for mild pain or headache.     [name] aspirin EC 81 MG tablet Take 81 mg by mouth daily.    . cyclobenzaprine (FLEXERIL) 10 MG tablet Take 1 tablet (10 mg total) by mouth 3  (three) times daily as needed for muscle spasms. 30 tablet 0  . ezetimibe (ZETIA) 10 MG tablet TAKE ONE TABLET BY MOUTH ONCE DAILY (Patient taking differently: TAKE 1 TABLET (10 MG) BY MOUTH ONCE DAILY AT BEDTIME.) 90 tablet 3  . gabapentin (NEURONTIN) 300 MG capsule Take 1 capsule (300 mg total) by mouth at bedtime. (Patient taking differently: Take 600 mg by mouth at bedtime. )    . gabapentin (NEURONTIN) 300 MG capsule TAKE 1 TO 2 CAPSULES BY MOUTH ONCE DAILY AT BEDTIME AS NEEDED FOR PAIN AND FOR SLEEP 180 capsule 0  . hydroxypropyl methylcellulose / hypromellose (ISOPTO TEARS / GONIOVISC) 2.5 % ophthalmic solution Place 1 drop into both eyes 3 (three) times daily as needed for dry eyes. 15 mL 11  . lanthanum (FOSRENOL) 1000 MG chewable tablet Chew 1,000 mg by mouth 2 (two) times daily.    . mesalamine (CANASA) 1000 MG suppository Place 0.5 suppositories (500 mg total) rectally at bedtime. (Patient taking differently: Place 500 mg rectally at bedtime as needed (for flare up). ) 30 suppository 2  . methimazole (TAPAZOLE) 5 MG tablet Take 1 tablet (5 mg total) by mouth daily. 30 tablet 11  . metoprolol tartrate (LOPRESSOR) 25 MG tablet Take 1 tablet (25 mg total) by mouth 2 (two) times daily. ON NON-HD DAYS (Patient taking differently: Take 25 mg by mouth 2 (two) times daily. ON NON-HD DAYS (Sunday, Monday, Wednesday, and Fridays ONLY)) 144 tablet 3  . midodrine (PROAMATINE) 10 MG tablet Take 1 tablet (10 mg total) by mouth See admin instructions. Once a day only on dialysis days (Tues/Thurs/Sat) (Patient taking differently: Take 10 mg by mouth Every Tuesday,Thursday,and Saturday with dialysis. Once a day only on dialysis days (Tues/Thurs/Sat)) 30 tablet 0  . multivitamin (RENA-VIT) TABS tablet Take 1 tablet by mouth daily. Reported on 10/24/2015    . oxyCODONE (OXY IR/ROXICODONE) 5 MG immediate release tablet Take 5 mg by mouth every 6 (six) hours as needed. For pain.  0  . pantoprazole (PROTONIX) 40 MG  tablet Take 40 mg by mouth every evening.     . polyethylene glycol (MIRALAX / GLYCOLAX) packet Take 17 g by mouth daily. (Patient taking differently: Take 17 g by mouth daily as needed for mild constipation. ) 14 each 0  . SENSIPAR 90 MG tablet Take 90 mg by mouth 2 (two) times daily.    Earnestine Mealing 625 MG tablet TAKE THREE TABLETS BY MOUTH TWICE DAILY WITH MEALS 180 tablet 11   No current facility-administered medications on file prior to visit.    Review of Systems  Constitutional: Negative for other unusual diaphoresis or sweats HENT: Negative for ear discharge or swelling Eyes: Negative for other worsening visual disturbances Respiratory: Negative for stridor or other  swelling  Gastrointestinal: Negative for worsening distension or other blood Genitourinary: Negative for retention or other urinary change Musculoskeletal: Negative for other MSK pain or swelling Skin: Negative for color change or other new lesions Neurological: Negative for worsening tremors and other numbness  Psychiatric/Behavioral: Negative for worsening agitation or other fatigue All other system neg per pt    Objective:   Physical Exam BP 128/84   Pulse 96   Temp 97.9 F (36.6 C) (Oral)   Ht 6\' 1"  (1.854 m)   Wt 223 lb (101.2 kg)   SpO2 98%   BMI 29.42 kg/m  VS noted,  Constitutional: Pt appears in NAD HENT: Head: NCAT.  Right Ear: External ear normal.  Left Ear: External ear normal.  Eyes: . Pupils are equal, round, and reactive to light. Conjunctivae and EOM are normal Nose: without d/c or deformity Neck: Neck supple. Gross normal ROM Cardiovascular: Normal rate and regular rhythm.   Pulmonary/Chest: Effort normal and breath sounds without rales or wheezing.  Abd:  Soft, NT, ND, + BS, no organomegaly Neurological: Pt is alert. At baseline orientation, motor grossly intact Skin: Skin is warm. No rashes or ulcers, bilat trace pedal edema, left distal post leg with 2 cm area tender, red, swelling  without drainage or red streaks Psychiatric: Pt behavior is normal without agitation , + depressed affect No other exam findings     Assessment & Plan:

## 2017-10-14 NOTE — Telephone Encounter (Signed)
°  Relation to pt: self Call back number: [phone] Pharmacy: [facility] ([address]), [place] [phone] (Phone) [phone] (Fax)     Reason for call:   Patient was seen today by Dr. [name], patient requesting allopurinol (ZYLOPRIM) 100 MG tablet, pharmacy contacted, please advise

## 2017-10-14 NOTE — Assessment & Plan Note (Signed)
Ok to restart celexa, declines counseling or psychiatric referrals

## 2017-10-14 NOTE — Assessment & Plan Note (Signed)
Lab Results  Component Value Date   HGBA1C 4.8 07/02/2017  stable overall by history and exam, recent data reviewed with pt, and pt to continue medical treatment as before,  to f/u any worsening symptoms or concerns

## 2017-10-14 NOTE — Assessment & Plan Note (Signed)
Early mild, without drainage, for doxy course, consider wound clinic referral if worsens

## 2017-10-15 ENCOUNTER — Other Ambulatory Visit: Payer: Self-pay | Admitting: Pharmacy Technician

## 2017-10-15 DIAGNOSIS — N2581 Secondary hyperparathyroidism of renal origin: Secondary | ICD-10-CM | POA: Diagnosis not present

## 2017-10-15 DIAGNOSIS — N186 End stage renal disease: Secondary | ICD-10-CM | POA: Diagnosis not present

## 2017-10-15 DIAGNOSIS — D631 Anemia in chronic kidney disease: Secondary | ICD-10-CM | POA: Diagnosis not present

## 2017-10-15 DIAGNOSIS — D509 Iron deficiency anemia, unspecified: Secondary | ICD-10-CM | POA: Diagnosis not present

## 2017-10-15 NOTE — Patient Outreach (Signed)
[facility]) Care Management  10/15/2017  [name] [birth year] [id number]  Unsuccessful patient outreach call to [name] in reference to [employer] patient Media planner for [employer]et[employer] that was mailed last week. I sent an application prior to the one last week that was not received. I am following-up to make sure the recent application was received and to answer any questions the patient may have. I left a HIPAA compliant voicemail requesting for the patient to return my call.  [name], [place] [phone]

## 2017-10-16 ENCOUNTER — Other Ambulatory Visit: Payer: Self-pay | Admitting: Pharmacy Technician

## 2017-10-16 DIAGNOSIS — M48062 Spinal stenosis, lumbar region with neurogenic claudication: Secondary | ICD-10-CM | POA: Diagnosis not present

## 2017-10-16 DIAGNOSIS — M519 Unspecified thoracic, thoracolumbar and lumbosacral intervertebral disc disorder: Secondary | ICD-10-CM | POA: Diagnosis not present

## 2017-10-16 DIAGNOSIS — M5412 Radiculopathy, cervical region: Secondary | ICD-10-CM | POA: Diagnosis not present

## 2017-10-16 DIAGNOSIS — E1142 Type 2 diabetes mellitus with diabetic polyneuropathy: Secondary | ICD-10-CM | POA: Diagnosis not present

## 2017-10-16 DIAGNOSIS — E1122 Type 2 diabetes mellitus with diabetic chronic kidney disease: Secondary | ICD-10-CM | POA: Diagnosis not present

## 2017-10-16 DIAGNOSIS — E1151 Type 2 diabetes mellitus with diabetic peripheral angiopathy without gangrene: Secondary | ICD-10-CM | POA: Diagnosis not present

## 2017-10-16 NOTE — Patient Outreach (Signed)
[place] [facility]) Care Management  10/16/2017  BRAYLEE BOSHER 1951-06-26 [id number]   Incoming call from the spouse of Mr. Parkerson in reference to [employer] patient assistance application that has been mailed twice. Mrs Mena states neither one of the application's has been received as of yet. I mailed the last application on 9/50/7225 and will call the patient back on Monday to check if it was received. If it has not been received I will reprint the application and go to the home to have it completed.  [name], [place] [phone]

## 2017-10-17 ENCOUNTER — Ambulatory Visit: Payer: Self-pay | Admitting: Pharmacy Technician

## 2017-10-17 DIAGNOSIS — N2581 Secondary hyperparathyroidism of renal origin: Secondary | ICD-10-CM | POA: Diagnosis not present

## 2017-10-17 DIAGNOSIS — D509 Iron deficiency anemia, unspecified: Secondary | ICD-10-CM | POA: Diagnosis not present

## 2017-10-17 DIAGNOSIS — D631 Anemia in chronic kidney disease: Secondary | ICD-10-CM | POA: Diagnosis not present

## 2017-10-17 DIAGNOSIS — N186 End stage renal disease: Secondary | ICD-10-CM | POA: Diagnosis not present

## 2017-10-17 IMAGING — DX DG CHEST 2V
2 series · 2 of 2 positions shown · non-contrast
Comparison: Chest radiograph performed 10/16/2016

CLINICAL DATA: Acute onset of generalized fatigue. Initial
encounter.

EXAM:
CHEST  2 VIEW

[chest pa]
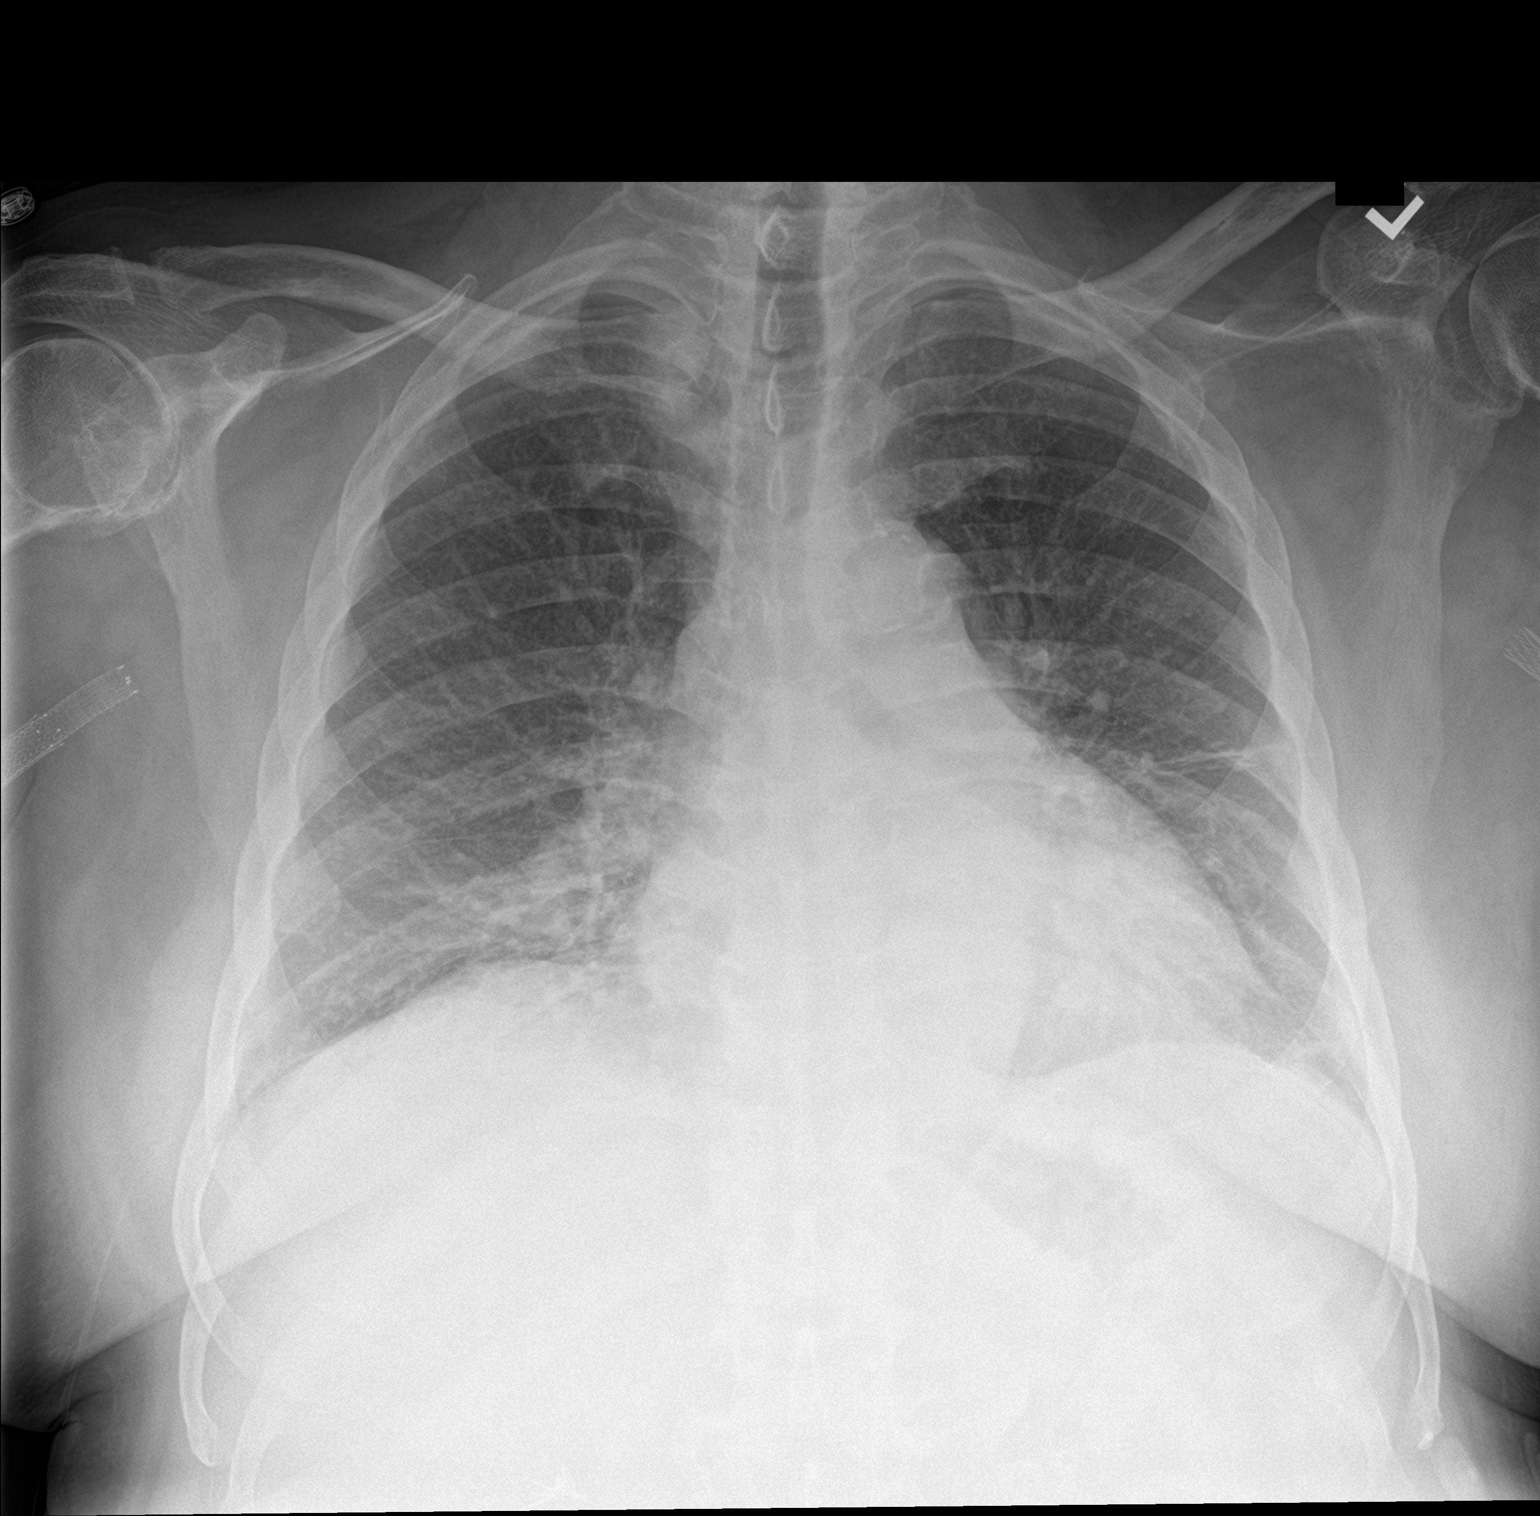

[chest lat]
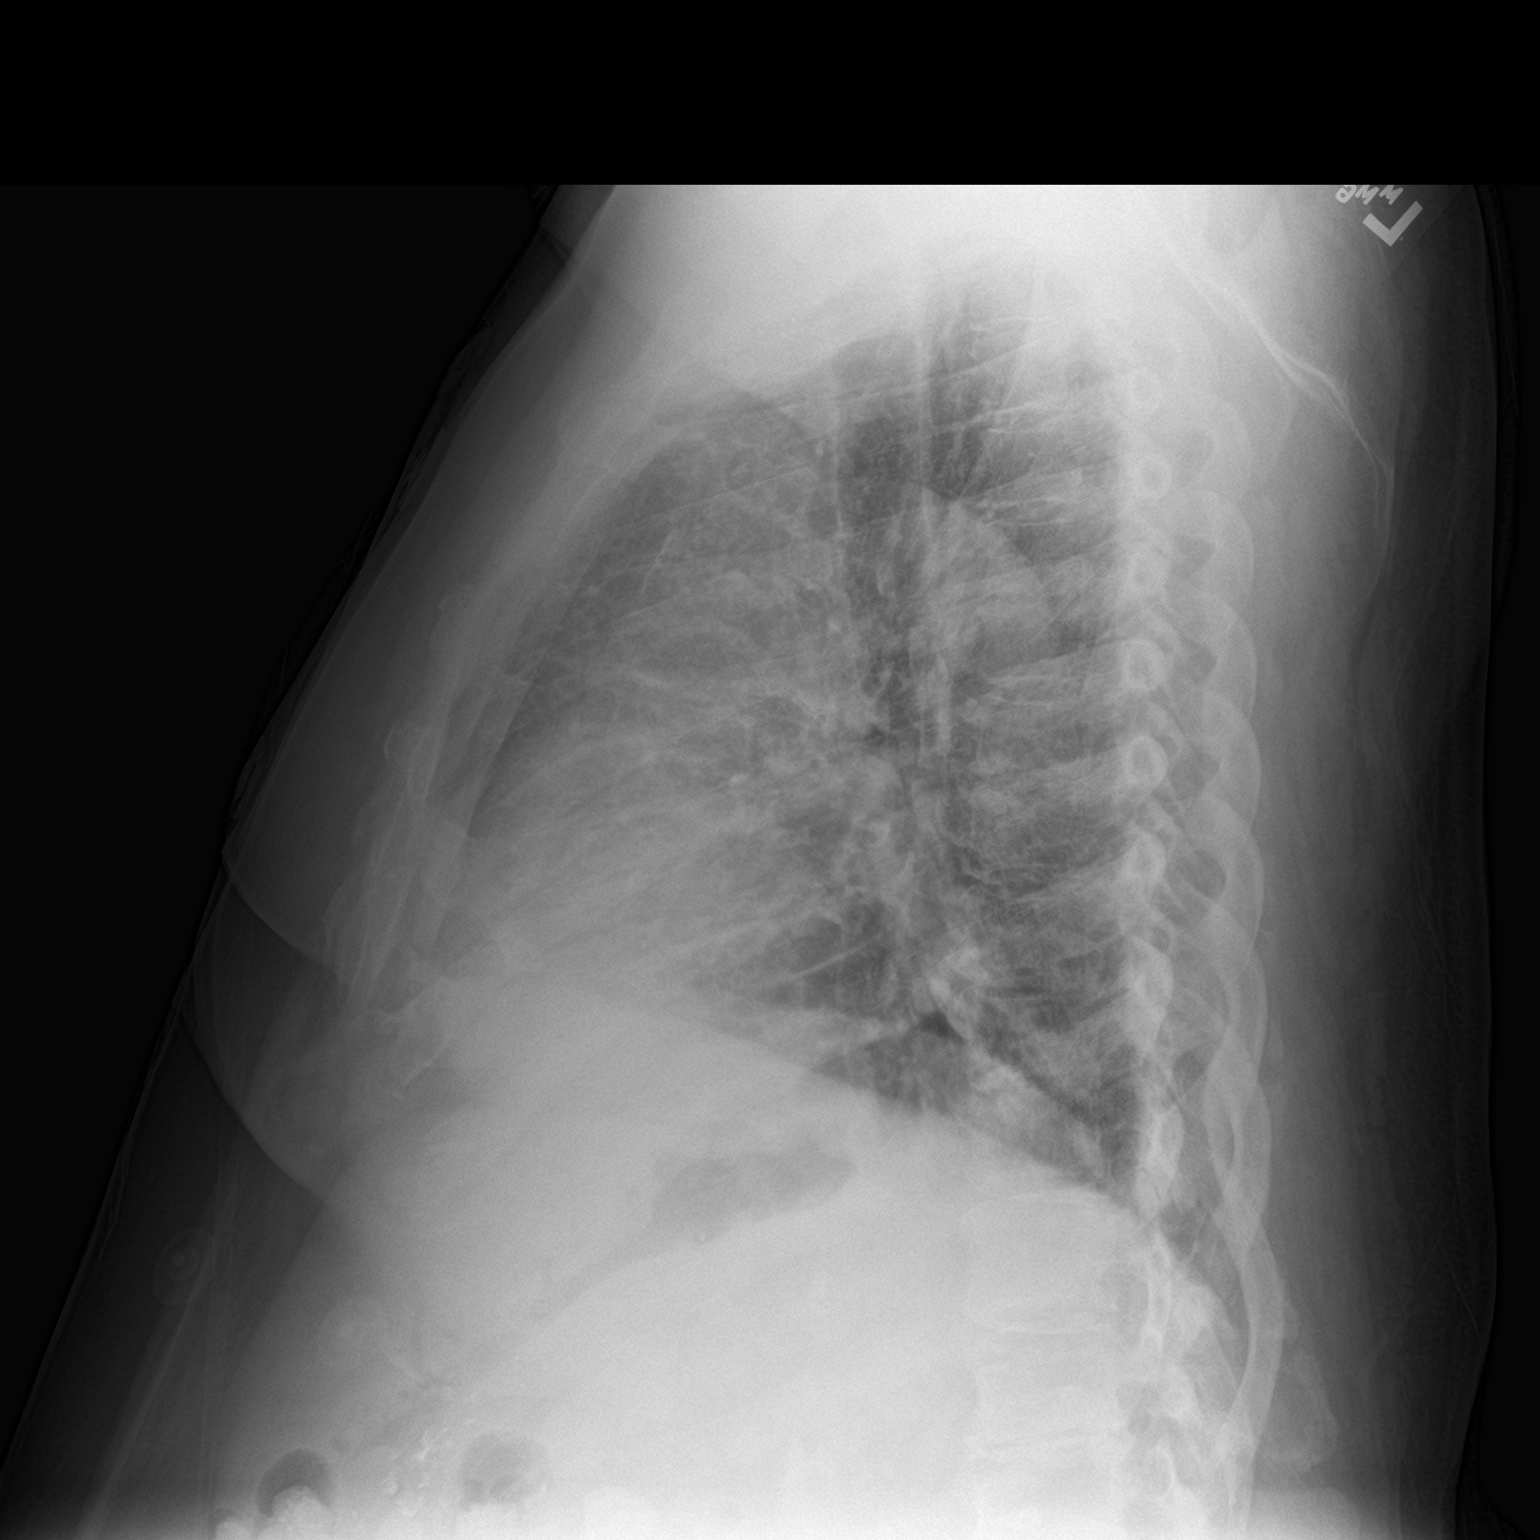

[2 of 2 positions shown; findings below may reference images not displayed]

FINDINGS: The lungs are well-aerated. Vascular congestion is noted. Increased
interstitial markings may reflect mild interstitial edema. There is
no evidence of pleural effusion or pneumothorax.

The heart is borderline normal in size. No acute osseous
abnormalities are seen. A vascular stent is noted overlying the
right axilla. Cervical spinal fusion hardware is partially imaged.
IMPRESSION: Vascular congestion. Increased interstitial markings may reflect
mild interstitial edema.

## 2017-10-19 DIAGNOSIS — D509 Iron deficiency anemia, unspecified: Secondary | ICD-10-CM | POA: Diagnosis not present

## 2017-10-19 DIAGNOSIS — D631 Anemia in chronic kidney disease: Secondary | ICD-10-CM | POA: Diagnosis not present

## 2017-10-19 DIAGNOSIS — N2581 Secondary hyperparathyroidism of renal origin: Secondary | ICD-10-CM | POA: Diagnosis not present

## 2017-10-19 DIAGNOSIS — N186 End stage renal disease: Secondary | ICD-10-CM | POA: Diagnosis not present

## 2017-10-21 ENCOUNTER — Other Ambulatory Visit: Payer: Self-pay | Admitting: Pharmacy Technician

## 2017-10-21 DIAGNOSIS — M519 Unspecified thoracic, thoracolumbar and lumbosacral intervertebral disc disorder: Secondary | ICD-10-CM | POA: Diagnosis not present

## 2017-10-21 DIAGNOSIS — E1122 Type 2 diabetes mellitus with diabetic chronic kidney disease: Secondary | ICD-10-CM | POA: Diagnosis not present

## 2017-10-21 DIAGNOSIS — M48062 Spinal stenosis, lumbar region with neurogenic claudication: Secondary | ICD-10-CM | POA: Diagnosis not present

## 2017-10-21 DIAGNOSIS — C61 Malignant neoplasm of prostate: Secondary | ICD-10-CM | POA: Diagnosis not present

## 2017-10-21 DIAGNOSIS — E1151 Type 2 diabetes mellitus with diabetic peripheral angiopathy without gangrene: Secondary | ICD-10-CM | POA: Diagnosis not present

## 2017-10-21 DIAGNOSIS — M5412 Radiculopathy, cervical region: Secondary | ICD-10-CM | POA: Diagnosis not present

## 2017-10-21 DIAGNOSIS — E1142 Type 2 diabetes mellitus with diabetic polyneuropathy: Secondary | ICD-10-CM | POA: Diagnosis not present

## 2017-10-21 NOTE — Patient Outreach (Signed)
[place] [facility]) Care Management  10/21/2017  DUGLAS HEIER 1950-10-11 [id number]   Successful follow-up call to the spouse [name]) of Mr. Demore in reference to [employer] patient assistance application. DPR on file allowing me to speak with Mrs. Hepner. Called to check if application was received over the weekend or if I would need to go to the home with a new application for completion. Per Mrs. Robley the application was received and I went over it with her to answer the questions about the information needed to complete the application. They will mail it back to [facility] this week.  [name], [place] [phone]

## 2017-10-22 DIAGNOSIS — D631 Anemia in chronic kidney disease: Secondary | ICD-10-CM | POA: Diagnosis not present

## 2017-10-22 DIAGNOSIS — N186 End stage renal disease: Secondary | ICD-10-CM | POA: Diagnosis not present

## 2017-10-22 DIAGNOSIS — N2581 Secondary hyperparathyroidism of renal origin: Secondary | ICD-10-CM | POA: Diagnosis not present

## 2017-10-22 DIAGNOSIS — D509 Iron deficiency anemia, unspecified: Secondary | ICD-10-CM | POA: Diagnosis not present

## 2017-10-23 DIAGNOSIS — E1142 Type 2 diabetes mellitus with diabetic polyneuropathy: Secondary | ICD-10-CM | POA: Diagnosis not present

## 2017-10-23 DIAGNOSIS — M5412 Radiculopathy, cervical region: Secondary | ICD-10-CM | POA: Diagnosis not present

## 2017-10-23 DIAGNOSIS — M519 Unspecified thoracic, thoracolumbar and lumbosacral intervertebral disc disorder: Secondary | ICD-10-CM | POA: Diagnosis not present

## 2017-10-23 DIAGNOSIS — M48062 Spinal stenosis, lumbar region with neurogenic claudication: Secondary | ICD-10-CM | POA: Diagnosis not present

## 2017-10-23 DIAGNOSIS — E1122 Type 2 diabetes mellitus with diabetic chronic kidney disease: Secondary | ICD-10-CM | POA: Diagnosis not present

## 2017-10-23 DIAGNOSIS — E1151 Type 2 diabetes mellitus with diabetic peripheral angiopathy without gangrene: Secondary | ICD-10-CM | POA: Diagnosis not present

## 2017-10-24 DIAGNOSIS — D509 Iron deficiency anemia, unspecified: Secondary | ICD-10-CM | POA: Diagnosis not present

## 2017-10-24 DIAGNOSIS — N2581 Secondary hyperparathyroidism of renal origin: Secondary | ICD-10-CM | POA: Diagnosis not present

## 2017-10-24 DIAGNOSIS — D631 Anemia in chronic kidney disease: Secondary | ICD-10-CM | POA: Diagnosis not present

## 2017-10-24 DIAGNOSIS — N186 End stage renal disease: Secondary | ICD-10-CM | POA: Diagnosis not present

## 2017-10-24 DIAGNOSIS — E119 Type 2 diabetes mellitus without complications: Secondary | ICD-10-CM | POA: Diagnosis not present

## 2017-10-25 DIAGNOSIS — C61 Malignant neoplasm of prostate: Secondary | ICD-10-CM | POA: Diagnosis not present

## 2017-10-26 DIAGNOSIS — N186 End stage renal disease: Secondary | ICD-10-CM | POA: Diagnosis not present

## 2017-10-26 DIAGNOSIS — D509 Iron deficiency anemia, unspecified: Secondary | ICD-10-CM | POA: Diagnosis not present

## 2017-10-26 DIAGNOSIS — D631 Anemia in chronic kidney disease: Secondary | ICD-10-CM | POA: Diagnosis not present

## 2017-10-26 DIAGNOSIS — N2581 Secondary hyperparathyroidism of renal origin: Secondary | ICD-10-CM | POA: Diagnosis not present

## 2017-10-28 DIAGNOSIS — M519 Unspecified thoracic, thoracolumbar and lumbosacral intervertebral disc disorder: Secondary | ICD-10-CM | POA: Diagnosis not present

## 2017-10-28 DIAGNOSIS — M48062 Spinal stenosis, lumbar region with neurogenic claudication: Secondary | ICD-10-CM | POA: Diagnosis not present

## 2017-10-28 DIAGNOSIS — M5412 Radiculopathy, cervical region: Secondary | ICD-10-CM | POA: Diagnosis not present

## 2017-10-28 DIAGNOSIS — E1122 Type 2 diabetes mellitus with diabetic chronic kidney disease: Secondary | ICD-10-CM | POA: Diagnosis not present

## 2017-10-28 DIAGNOSIS — E1142 Type 2 diabetes mellitus with diabetic polyneuropathy: Secondary | ICD-10-CM | POA: Diagnosis not present

## 2017-10-28 DIAGNOSIS — E1151 Type 2 diabetes mellitus with diabetic peripheral angiopathy without gangrene: Secondary | ICD-10-CM | POA: Diagnosis not present

## 2017-10-29 DIAGNOSIS — N2581 Secondary hyperparathyroidism of renal origin: Secondary | ICD-10-CM | POA: Diagnosis not present

## 2017-10-29 DIAGNOSIS — D509 Iron deficiency anemia, unspecified: Secondary | ICD-10-CM | POA: Diagnosis not present

## 2017-10-29 DIAGNOSIS — N186 End stage renal disease: Secondary | ICD-10-CM | POA: Diagnosis not present

## 2017-10-29 DIAGNOSIS — D631 Anemia in chronic kidney disease: Secondary | ICD-10-CM | POA: Diagnosis not present

## 2017-10-30 ENCOUNTER — Ambulatory Visit: Payer: Medicare Other | Admitting: Endocrinology

## 2017-10-30 DIAGNOSIS — Z992 Dependence on renal dialysis: Secondary | ICD-10-CM | POA: Diagnosis not present

## 2017-10-30 DIAGNOSIS — D509 Iron deficiency anemia, unspecified: Secondary | ICD-10-CM | POA: Diagnosis not present

## 2017-10-30 DIAGNOSIS — N186 End stage renal disease: Secondary | ICD-10-CM | POA: Diagnosis not present

## 2017-10-30 DIAGNOSIS — D631 Anemia in chronic kidney disease: Secondary | ICD-10-CM | POA: Diagnosis not present

## 2017-10-30 DIAGNOSIS — I871 Compression of vein: Secondary | ICD-10-CM | POA: Diagnosis not present

## 2017-10-30 DIAGNOSIS — T82858A Stenosis of vascular prosthetic devices, implants and grafts, initial encounter: Secondary | ICD-10-CM | POA: Diagnosis not present

## 2017-10-30 DIAGNOSIS — N2581 Secondary hyperparathyroidism of renal origin: Secondary | ICD-10-CM | POA: Diagnosis not present

## 2017-10-31 DIAGNOSIS — D509 Iron deficiency anemia, unspecified: Secondary | ICD-10-CM | POA: Diagnosis not present

## 2017-10-31 DIAGNOSIS — N2581 Secondary hyperparathyroidism of renal origin: Secondary | ICD-10-CM | POA: Diagnosis not present

## 2017-10-31 DIAGNOSIS — N186 End stage renal disease: Secondary | ICD-10-CM | POA: Diagnosis not present

## 2017-10-31 DIAGNOSIS — E1129 Type 2 diabetes mellitus with other diabetic kidney complication: Secondary | ICD-10-CM | POA: Diagnosis not present

## 2017-10-31 DIAGNOSIS — D631 Anemia in chronic kidney disease: Secondary | ICD-10-CM | POA: Diagnosis not present

## 2017-10-31 DIAGNOSIS — Z992 Dependence on renal dialysis: Secondary | ICD-10-CM | POA: Diagnosis not present

## 2017-11-01 DIAGNOSIS — E1151 Type 2 diabetes mellitus with diabetic peripheral angiopathy without gangrene: Secondary | ICD-10-CM | POA: Diagnosis not present

## 2017-11-01 DIAGNOSIS — Z992 Dependence on renal dialysis: Secondary | ICD-10-CM | POA: Diagnosis not present

## 2017-11-01 DIAGNOSIS — M519 Unspecified thoracic, thoracolumbar and lumbosacral intervertebral disc disorder: Secondary | ICD-10-CM | POA: Diagnosis not present

## 2017-11-01 DIAGNOSIS — E1129 Type 2 diabetes mellitus with other diabetic kidney complication: Secondary | ICD-10-CM | POA: Diagnosis not present

## 2017-11-01 DIAGNOSIS — D631 Anemia in chronic kidney disease: Secondary | ICD-10-CM | POA: Diagnosis not present

## 2017-11-01 DIAGNOSIS — M48062 Spinal stenosis, lumbar region with neurogenic claudication: Secondary | ICD-10-CM | POA: Diagnosis not present

## 2017-11-01 DIAGNOSIS — E1122 Type 2 diabetes mellitus with diabetic chronic kidney disease: Secondary | ICD-10-CM | POA: Diagnosis not present

## 2017-11-01 DIAGNOSIS — N186 End stage renal disease: Secondary | ICD-10-CM | POA: Diagnosis not present

## 2017-11-01 DIAGNOSIS — M5412 Radiculopathy, cervical region: Secondary | ICD-10-CM | POA: Diagnosis not present

## 2017-11-01 DIAGNOSIS — E1142 Type 2 diabetes mellitus with diabetic polyneuropathy: Secondary | ICD-10-CM | POA: Diagnosis not present

## 2017-11-01 DIAGNOSIS — E119 Type 2 diabetes mellitus without complications: Secondary | ICD-10-CM | POA: Diagnosis not present

## 2017-11-01 DIAGNOSIS — N2581 Secondary hyperparathyroidism of renal origin: Secondary | ICD-10-CM | POA: Diagnosis not present

## 2017-11-02 DIAGNOSIS — N186 End stage renal disease: Secondary | ICD-10-CM | POA: Diagnosis not present

## 2017-11-02 DIAGNOSIS — D631 Anemia in chronic kidney disease: Secondary | ICD-10-CM | POA: Diagnosis not present

## 2017-11-02 DIAGNOSIS — N2581 Secondary hyperparathyroidism of renal origin: Secondary | ICD-10-CM | POA: Diagnosis not present

## 2017-11-02 DIAGNOSIS — E119 Type 2 diabetes mellitus without complications: Secondary | ICD-10-CM | POA: Diagnosis not present

## 2017-11-04 DIAGNOSIS — M519 Unspecified thoracic, thoracolumbar and lumbosacral intervertebral disc disorder: Secondary | ICD-10-CM | POA: Diagnosis not present

## 2017-11-04 DIAGNOSIS — E1122 Type 2 diabetes mellitus with diabetic chronic kidney disease: Secondary | ICD-10-CM | POA: Diagnosis not present

## 2017-11-04 DIAGNOSIS — E1151 Type 2 diabetes mellitus with diabetic peripheral angiopathy without gangrene: Secondary | ICD-10-CM | POA: Diagnosis not present

## 2017-11-04 DIAGNOSIS — E1142 Type 2 diabetes mellitus with diabetic polyneuropathy: Secondary | ICD-10-CM | POA: Diagnosis not present

## 2017-11-04 DIAGNOSIS — M48062 Spinal stenosis, lumbar region with neurogenic claudication: Secondary | ICD-10-CM | POA: Diagnosis not present

## 2017-11-04 DIAGNOSIS — M5412 Radiculopathy, cervical region: Secondary | ICD-10-CM | POA: Diagnosis not present

## 2017-11-05 DIAGNOSIS — D631 Anemia in chronic kidney disease: Secondary | ICD-10-CM | POA: Diagnosis not present

## 2017-11-05 DIAGNOSIS — N186 End stage renal disease: Secondary | ICD-10-CM | POA: Diagnosis not present

## 2017-11-05 DIAGNOSIS — N2581 Secondary hyperparathyroidism of renal origin: Secondary | ICD-10-CM | POA: Diagnosis not present

## 2017-11-05 DIAGNOSIS — E119 Type 2 diabetes mellitus without complications: Secondary | ICD-10-CM | POA: Diagnosis not present

## 2017-11-07 DIAGNOSIS — E119 Type 2 diabetes mellitus without complications: Secondary | ICD-10-CM | POA: Diagnosis not present

## 2017-11-07 DIAGNOSIS — N186 End stage renal disease: Secondary | ICD-10-CM | POA: Diagnosis not present

## 2017-11-07 DIAGNOSIS — N2581 Secondary hyperparathyroidism of renal origin: Secondary | ICD-10-CM | POA: Diagnosis not present

## 2017-11-07 DIAGNOSIS — D631 Anemia in chronic kidney disease: Secondary | ICD-10-CM | POA: Diagnosis not present

## 2017-11-08 ENCOUNTER — Other Ambulatory Visit: Payer: Self-pay | Admitting: Pharmacy Technician

## 2017-11-08 DIAGNOSIS — E1142 Type 2 diabetes mellitus with diabetic polyneuropathy: Secondary | ICD-10-CM | POA: Diagnosis not present

## 2017-11-08 DIAGNOSIS — E1151 Type 2 diabetes mellitus with diabetic peripheral angiopathy without gangrene: Secondary | ICD-10-CM | POA: Diagnosis not present

## 2017-11-08 DIAGNOSIS — E1122 Type 2 diabetes mellitus with diabetic chronic kidney disease: Secondary | ICD-10-CM | POA: Diagnosis not present

## 2017-11-08 DIAGNOSIS — M5412 Radiculopathy, cervical region: Secondary | ICD-10-CM | POA: Diagnosis not present

## 2017-11-08 DIAGNOSIS — M48062 Spinal stenosis, lumbar region with neurogenic claudication: Secondary | ICD-10-CM | POA: Diagnosis not present

## 2017-11-08 DIAGNOSIS — M519 Unspecified thoracic, thoracolumbar and lumbosacral intervertebral disc disorder: Secondary | ICD-10-CM | POA: Diagnosis not present

## 2017-11-08 NOTE — Patient Outreach (Signed)
[place] [facility]) Care Management  11/08/2017  [name] 15-May-1951 [id number]  Successful outreach call in reference to [employer] patient assistance application that was mailed last month. I spoke with the patient's spouse [name]) and she has consent to speak on the patient's behalf. Per our last conversation she was mailing the application after we spoke on 10/21/2017. She states that she hasn't mailed the application yet but will complete it over the weekend and mail it back to [facility].  [name], [place] [phone]

## 2017-11-09 DIAGNOSIS — N2581 Secondary hyperparathyroidism of renal origin: Secondary | ICD-10-CM | POA: Diagnosis not present

## 2017-11-09 DIAGNOSIS — E119 Type 2 diabetes mellitus without complications: Secondary | ICD-10-CM | POA: Diagnosis not present

## 2017-11-09 DIAGNOSIS — N186 End stage renal disease: Secondary | ICD-10-CM | POA: Diagnosis not present

## 2017-11-09 DIAGNOSIS — D631 Anemia in chronic kidney disease: Secondary | ICD-10-CM | POA: Diagnosis not present

## 2017-11-11 DIAGNOSIS — E1122 Type 2 diabetes mellitus with diabetic chronic kidney disease: Secondary | ICD-10-CM | POA: Diagnosis not present

## 2017-11-11 DIAGNOSIS — E1151 Type 2 diabetes mellitus with diabetic peripheral angiopathy without gangrene: Secondary | ICD-10-CM | POA: Diagnosis not present

## 2017-11-11 DIAGNOSIS — M519 Unspecified thoracic, thoracolumbar and lumbosacral intervertebral disc disorder: Secondary | ICD-10-CM | POA: Diagnosis not present

## 2017-11-11 DIAGNOSIS — M48062 Spinal stenosis, lumbar region with neurogenic claudication: Secondary | ICD-10-CM | POA: Diagnosis not present

## 2017-11-11 DIAGNOSIS — M5412 Radiculopathy, cervical region: Secondary | ICD-10-CM | POA: Diagnosis not present

## 2017-11-11 DIAGNOSIS — E1142 Type 2 diabetes mellitus with diabetic polyneuropathy: Secondary | ICD-10-CM | POA: Diagnosis not present

## 2017-11-12 DIAGNOSIS — N186 End stage renal disease: Secondary | ICD-10-CM | POA: Diagnosis not present

## 2017-11-12 DIAGNOSIS — E119 Type 2 diabetes mellitus without complications: Secondary | ICD-10-CM | POA: Diagnosis not present

## 2017-11-12 DIAGNOSIS — N2581 Secondary hyperparathyroidism of renal origin: Secondary | ICD-10-CM | POA: Diagnosis not present

## 2017-11-12 DIAGNOSIS — D631 Anemia in chronic kidney disease: Secondary | ICD-10-CM | POA: Diagnosis not present

## 2017-11-13 ENCOUNTER — Ambulatory Visit (INDEPENDENT_AMBULATORY_CARE_PROVIDER_SITE_OTHER): Payer: Medicare Other | Admitting: Internal Medicine

## 2017-11-13 ENCOUNTER — Ambulatory Visit: Payer: Medicare Other | Admitting: Internal Medicine

## 2017-11-13 ENCOUNTER — Encounter: Payer: Self-pay | Admitting: Internal Medicine

## 2017-11-13 VITALS — BP 116/72 | HR 92 | Temp 98.0°F | Ht 73.0 in | Wt 224.0 lb

## 2017-11-13 DIAGNOSIS — E1121 Type 2 diabetes mellitus with diabetic nephropathy: Secondary | ICD-10-CM

## 2017-11-13 DIAGNOSIS — I10 Essential (primary) hypertension: Secondary | ICD-10-CM | POA: Diagnosis not present

## 2017-11-13 DIAGNOSIS — L02416 Cutaneous abscess of left lower limb: Secondary | ICD-10-CM | POA: Diagnosis not present

## 2017-11-13 MED ORDER — AZITHROMYCIN 250 MG PO TABS
ORAL_TABLET | ORAL | 1 refills | Status: DC
Start: 2017-11-13 — End: 2018-01-01

## 2017-11-13 NOTE — Assessment & Plan Note (Signed)
With possible recurrrence, for zpack asd,  to f/u any worsening symptoms or concerns

## 2017-11-13 NOTE — Assessment & Plan Note (Signed)
stable overall by history and exam, recent data reviewed with pt, and pt to continue medical treatment as before,  to f/u any worsening symptoms or concerns BP Readings from Last 3 Encounters:  11/13/17 116/72  10/14/17 128/84  08/01/17 137/84

## 2017-11-13 NOTE — Assessment & Plan Note (Signed)
stable overall by history and exam, recent data reviewed with pt, and pt to continue medical treatment as before,  to f/u any worsening symptoms or concerns Lab Results  Component Value Date   HGBA1C 4.8 07/02/2017

## 2017-11-13 NOTE — Patient Instructions (Addendum)
Please take all new medication as prescribed - the antibiotic  You can also take Mucinex DM  (or it's generic off brand) for congestion and cough, and tylenol as needed for pain.  Please continue all other medications as before, and refills have been done if requested.  Please have the pharmacy call with any other refills you may need.  Please keep your appointments with your specialists as you may have planned  Please return in 6 months, or sooner if needed

## 2017-11-13 NOTE — Progress Notes (Signed)
Subjective:    Patient ID: [name], male    DOB: June 16, 1951, 67 y.o.   MRN: [id number]  HPI   Here with wife, did quite well with recent antibx, but now c/o the same spot to post distal leg becoming more tender swelling x 3 days though not red , no red streaks, fever, or drainage.  No open wounds or chills.   Pt denies polydipsia, polyuria  Pt denies chest pain, increased sob or doe, wheezing, orthopnea, PND, increased LE swelling, palpitations, dizziness or syncope. Past Medical History:  Diagnosis Date  . Allergic rhinitis, cause unspecified 02/24/2014  . Anemia 06/16/2011  . BENIGN PROSTATIC HYPERTROPHY 10/14/2009  . CAD, NATIVE VESSEL 02/06/2009   a. 06/2007 s/p Taxus DES to the RCA;  b. 08/2016 NSTEMI in setting of SVT/PCI: LM 30ost, LAD 52m (3.0x16 Synergy DES), LCX 79m, OM1 60, RI 40, RCA 70p/m, 36m - not amenable to PCI.  [name] Cervical radiculopathy, chronic 02/23/2016   Right c5-6 by NCS/EMG  . CHEST PAIN 03/29/2010  . Chronic combined systolic (congestive) and diastolic (congestive) heart failure ([place])    a. 10/2016 Echo: EF 40-45%, Gr2 DD. mildly dil LA.  [name] COLONIC POLYPS, HX OF 10/14/2009  . Dementia [id number]  . DEPRESSION 10/14/2009  . Depression 09/24/2015  . DIABETES MELLITUS, TYPE II 02/01/2010  . DIZZINESS 07/17/2010  . DYSLIPIDEMIA 06/18/2007  . ESRD (end stage renal disease) on dialysis ([place]) 08/04/2010   "TTS;  " (04/18/2015)  . FOOT PAIN 08/12/2008  . GAIT DISTURBANCE 03/03/2010  . GASTROENTERITIS, VIRAL 10/14/2009  . GERD 06/18/2007  . GOITER, MULTINODULAR 12/26/2007  . GOUT 06/18/2007  . GYNECOMASTIA 07/17/2010  . Hemodialysis access, [facility])    Dialysis T-Th-Sa ([place]) Right upper arm fistula  . Hyperlipidemia 10/16/2011  . Hyperparathyroidism, secondary ([place]) 06/16/2011  . HYPERTENSION 06/18/2007  . Hyperthyroidism   . Hypocalcemia 06/07/2010  . Ischemic cardiomyopathy    a. 10/2016 Echo: EF 40-45%.  . Lumbar stenosis with neurogenic  claudication   . NECK PAIN 07/31/2010  . ONYCHOMYCOSIS, TOENAILS 12/26/2007  . OSA on CPAP 10/16/2011  . Other malaise and fatigue 11/24/2009  . PERIPHERAL NEUROPATHY 06/18/2007  . Prostate cancer ([place])   . PSVT (paroxysmal supraventricular tachycardia) ([place])    a. 11/5007 complicated by NSTEMI;  b. 11/2016 Treated w/ adenosine in ED;  c. 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  [name] PULMONARY NODULE, RIGHT LOWER LOBE 06/08/2009  . Sleep apnea    cpap machine and o2  . TRANSAMINASES, SERUM, ELEVATED 02/01/2010  . Transfusion history    none recent  . Unspecified hypotension 01/30/2010   Past Surgical History:  Procedure Laterality Date  . ARTERIOVENOUS GRAFT PLACEMENT Right 2009   forearm/notes 02/01/2011  . AV FISTULA PLACEMENT  11/07/2011   Procedure: INSERTION OF ARTERIOVENOUS (AV) GORE-TEX GRAFT ARM;  Surgeon: Tinnie Gens, MD;  Location: [place];  Service: Vascular;  Laterality: Left;  . BACK SURGERY  1998  . BASCILIC VEIN TRANSPOSITION Right 02/27/2013   Procedure: BASCILIC VEIN TRANSPOSITION;  Surgeon: Mal Misty, MD;  Location: [place];  Service: Vascular;  Laterality: Right;  Right Basilic Vein Transposition   . CARDIAC CATHETERIZATION N/A 08/06/2016   Procedure: Left Heart Cath and Coronary Angiography;  Surgeon: Jolaine Artist, MD;  Location: [place] CV LAB;  Service: Cardiovascular;  Laterality: N/A;  . CARDIAC CATHETERIZATION N/A 08/07/2016   Procedure: Coronary/Graft Atherectomy-CSI LAD;  Surgeon: Peter M Martinique, MD;  Location: [place]  CV LAB;  Service: Cardiovascular;  Laterality: N/A;  . CERVICAL SPINE SURGERY  2/09   "to repair nerve problems in my left arm"  . CHOLECYSTECTOMY    . COLONOSCOPY WITH PROPOFOL N/A 04/26/2017   Procedure: COLONOSCOPY WITH PROPOFOL;  Surgeon: Otis Brace, MD;  Location: [place];  Service: Gastroenterology;  Laterality: N/A;  . CORONARY ANGIOPLASTY WITH STENT PLACEMENT  06/11/2008  . CORONARY ANGIOPLASTY WITH STENT PLACEMENT  06/2007   TAXUS stent  to RCA/notes 01/31/2011  . ESOPHAGOGASTRODUODENOSCOPY  09/28/2011   Procedure: ESOPHAGOGASTRODUODENOSCOPY (EGD);  Surgeon: Missy Sabins, MD;  Location: [facility] ENDOSCOPY;  Service: Endoscopy;  Laterality: N/A;  . ESOPHAGOGASTRODUODENOSCOPY N/A 04/07/2015   Procedure: ESOPHAGOGASTRODUODENOSCOPY (EGD);  Surgeon: Teena Irani, MD;  [facility]: [facility];  Service: Endoscopy;  Laterality: N/A;  . ESOPHAGOGASTRODUODENOSCOPY N/A 04/19/2015   Procedure: ESOPHAGOGASTRODUODENOSCOPY (EGD);  Surgeon: Arta Silence, MD;  [facility];  Service: Endoscopy;  Laterality: N/A;  . FLEXIBLE SIGMOIDOSCOPY N/A 05/21/2017   Procedure: Conni Elliot;  Surgeon: Clarene Essex, MD;  Location: [place];  Service: Endoscopy;  Laterality: N/A;  . FLEXIBLE SIGMOIDOSCOPY Left 07/02/2017   Procedure: FLEXIBLE SIGMOIDOSCOPY;  Surgeon: Laurence Spates, MD;  Location: [place];  Service: Endoscopy;  Laterality: Left;  . FOREIGN BODY REMOVAL  09/2003   via upper endoscopy/notes 02/12/2011  . GIVENS CAPSULE STUDY  09/30/2011   Procedure: GIVENS CAPSULE STUDY;  Surgeon: Jeryl Columbia, MD;  [facility];  Service: Endoscopy;  Laterality: N/A;  . INSERTION OF DIALYSIS CATHETER Right 2014  . INSERTION OF DIALYSIS CATHETER Left 02/11/2013   Procedure: INSERTION OF DIALYSIS CATHETER;  Surgeon: Conrad [place]r-Skyway, MD;  Location: [place];  Service: Vascular;  Laterality: Left;  Ultrasound guided  . LUMBAR LAMINECTOMY/DECOMPRESSION MICRODISCECTOMY Bilateral 07/31/2017   Procedure: LAMINECTOMY AND FORAMINOTOMY- BILATERAL LUMBAR TWO- LUMBAR THREE;  Surgeon: Earnie Larsson, MD;  Location: [place];  Service: Neurosurgery;  Laterality: Bilateral;  LAMINECTOMY AND FORAMINOTOMY- BILATERAL LUMBAR 2- LUMBAR 3  . REMOVAL OF A DIALYSIS CATHETER Right 02/11/2013   Procedure: REMOVAL OF A DIALYSIS CATHETER;  Surgeon: Conrad [place] Level, MD;  Location: [place];  Service: Vascular;  Laterality: Right;  . SAVORY DILATION N/A 04/07/2015   Procedure:  SAVORY DILATION;  Surgeon: Teena Irani, MD;  Location: [facility] ENDOSCOPY;  Service: Endoscopy;  Laterality: N/A;  . SHUNTOGRAM N/A 09/20/2011   Procedure: Earney Mallet;  Surgeon: Conrad Momence, MD;  [facility]: [facility] CATH LAB;  Service: Cardiovascular;  Laterality: N/A;  . SVT ABLATION N/A 11/26/2016   Procedure: SVT Ablation;  Surgeon: Will Meredith Leeds, MD;  Location: [place] CV LAB;  Service: Cardiovascular;  Laterality: N/A;  . TONSILLECTOMY    . TOTAL KNEE ARTHROPLASTY Right 08/02/2015   Procedure: TOTAL KNEE ARTHROPLASTY;  Surgeon: Renette Butters, MD;  Location: [address];  Service: Orthopedics;  Laterality: Right;  . VENOGRAM N/A 01/26/2013   Procedure: VENOGRAM;  Surgeon: Angelia Mould, MD;  Location: [facility] CATH LAB;  Service: Cardiovascular;  Laterality: N/A;    reports that he quit smoking about 12 years ago. His smoking use included cigarettes. He has a 25.00 pack-year smoking history. He quit smokeless tobacco use about 12 years ago. He reports that he does not drink alcohol or use drugs. family history includes Diabetes in his father; Healthy in his child, child, and child; Heart disease in his father and sister; Hypertension in his father; Kidney failure in his father; Thyroid nodules in his sister. Allergies  Allergen Reactions  . Cephalexin Swelling  and Other (See Comments)    Tongue swelling  . Statins Other (See Comments)    Weak muscles  . Ciprofloxacin Rash   Current Outpatient Medications on File Prior to Visit  Medication Sig Dispense Refill  . acetaminophen (TYLENOL) 325 MG tablet Take 650 mg by mouth every 6 (six) hours as needed for mild pain or headache.     . allopurinol (ZYLOPRIM) 100 MG tablet TAKE ONE TABLET BY MOUTH ONCE DAILY 90 tablet 3  . aspirin EC 81 MG tablet Take 81 mg by mouth daily.    . citalopram (CELEXA) 10 MG tablet Take 1 tablet (10 mg total) by mouth at bedtime. 90 tablet 3  . cyclobenzaprine (FLEXERIL) 10 MG tablet Take 1 tablet (10 mg total) by mouth  3 (three) times daily as needed for muscle spasms. 30 tablet 0  . doxycycline (VIBRA-TABS) 100 MG tablet Take 1 tablet (100 mg total) by mouth 2 (two) times daily. 20 tablet 0  . ezetimibe (ZETIA) 10 MG tablet TAKE ONE TABLET BY MOUTH ONCE DAILY (Patient taking differently: TAKE 1 TABLET (10 MG) BY MOUTH ONCE DAILY AT BEDTIME.) 90 tablet 3  . gabapentin (NEURONTIN) 300 MG capsule Take 1 capsule (300 mg total) by mouth at bedtime. (Patient taking differently: Take 600 mg by mouth at bedtime. )    . gabapentin (NEURONTIN) 300 MG capsule TAKE 1 TO 2 CAPSULES BY MOUTH ONCE DAILY AT BEDTIME AS NEEDED FOR PAIN AND FOR SLEEP 180 capsule 0  . hydroxypropyl methylcellulose / hypromellose (ISOPTO TEARS / GONIOVISC) 2.5 % ophthalmic solution Place 1 drop into both eyes 3 (three) times daily as needed for dry eyes. 15 mL 11  . lanthanum (FOSRENOL) 1000 MG chewable tablet Chew 1,000 mg by mouth 2 (two) times daily.    . mesalamine (CANASA) 1000 MG suppository Place 0.5 suppositories (500 mg total) rectally at bedtime. (Patient taking differently: Place 500 mg rectally at bedtime as needed (for flare up). ) 30 suppository 2  . methimazole (TAPAZOLE) 5 MG tablet Take 1 tablet (5 mg total) by mouth daily. 30 tablet 11  . metoprolol tartrate (LOPRESSOR) 25 MG tablet Take 1 tablet (25 mg total) by mouth 2 (two) times daily. ON NON-HD DAYS (Patient taking differently: Take 25 mg by mouth 2 (two) times daily. ON NON-HD DAYS (Sunday, Monday, Wednesday, and Fridays ONLY)) 144 tablet 3  . midodrine (PROAMATINE) 10 MG tablet Take 1 tablet (10 mg total) by mouth See admin instructions. Once a day only on dialysis days (Tues/Thurs/Sat) (Patient taking differently: Take 10 mg by mouth Every Tuesday,Thursday,and Saturday with dialysis. Once a day only on dialysis days (Tues/Thurs/Sat)) 30 tablet 0  . multivitamin (RENA-VIT) TABS tablet Take 1 tablet by mouth daily. Reported on 10/24/2015    . oxyCODONE (OXY IR/ROXICODONE) 5 MG  immediate release tablet Take 5 mg by mouth every 6 (six) hours as needed. For pain.  0  . pantoprazole (PROTONIX) 40 MG tablet Take 40 mg by mouth every evening.     . polyethylene glycol (MIRALAX / GLYCOLAX) packet Take 17 g by mouth daily. (Patient taking differently: Take 17 g by mouth daily as needed for mild constipation. ) 14 each 0  . SENSIPAR 90 MG tablet Take 90 mg by mouth 2 (two) times daily.    Earnestine Mealing 625 MG tablet TAKE THREE TABLETS BY MOUTH TWICE DAILY WITH MEALS 180 tablet 11  . zolpidem (AMBIEN) 5 MG tablet Take 1 tablet (5 mg total) by mouth at  bedtime as needed for sleep. 90 tablet 1   No current facility-administered medications on file prior to visit.    Review of Systems  Constitutional: Negative for other unusual diaphoresis or sweats HENT: Negative for ear discharge or swelling Eyes: Negative for other worsening visual disturbances Respiratory: Negative for stridor or other swelling  Gastrointestinal: Negative for worsening distension or other blood Genitourinary: Negative for retention or other urinary change Musculoskeletal: Negative for other MSK pain or swelling Skin: Negative for color change or other new lesions Neurological: Negative for worsening tremors and other numbness  Psychiatric/Behavioral: Negative for worsening agitation or other fatigue All other system neg per pt    Objective:   Physical Exam BP 116/72   Pulse 92   Temp 98 F (36.7 C) (Oral)   Ht 6\' 1"  (1.854 m)   Wt 224 lb (101.6 kg)   SpO2 100%   BMI 29.55 kg/m  VS noted, not ill appearing Constitutional: Pt appears in NAD HENT: Head: NCAT.  Right Ear: External ear normal.  Left Ear: External ear normal.  Eyes: . Pupils are equal, round, and reactive to light. Conjunctivae and EOM are normal Nose: without d/c or deformity Neck: Neck supple. Gross normal ROM Cardiovascular: Normal rate and regular rhythm.   Pulmonary/Chest: Effort normal and breath sounds without rales or  wheezing.  Neurological: Pt is alert. At baseline orientation, motor grossly intact Skin: Skin is warm. No rashes, no LE edema but left post distal leg with 1.5 cm area red, tender, swelling about a scabbed lesion Psychiatric: Pt behavior is normal without agitation  No other exam findings    Assessment & Plan:

## 2017-11-14 ENCOUNTER — Other Ambulatory Visit: Payer: Self-pay | Admitting: Pharmacy Technician

## 2017-11-14 ENCOUNTER — Ambulatory Visit: Payer: Self-pay | Admitting: Pharmacy Technician

## 2017-11-14 DIAGNOSIS — N2581 Secondary hyperparathyroidism of renal origin: Secondary | ICD-10-CM | POA: Diagnosis not present

## 2017-11-14 DIAGNOSIS — E119 Type 2 diabetes mellitus without complications: Secondary | ICD-10-CM | POA: Diagnosis not present

## 2017-11-14 DIAGNOSIS — N186 End stage renal disease: Secondary | ICD-10-CM | POA: Diagnosis not present

## 2017-11-14 DIAGNOSIS — D631 Anemia in chronic kidney disease: Secondary | ICD-10-CM | POA: Diagnosis not present

## 2017-11-14 NOTE — Patient Outreach (Signed)
[facility]) Care Management  11/14/2017  [name] 10/30/1950 [id number]  Unsuccessful patient outreach call to patient in reference to [employer] patient assistance application for Zetia. I contacted the patient to make him aware that I received his portion of the application and will mail it to [employer] tomorrow for evaluation. The voicemail was full so I was unable to leave a message. Once mailed the application process usually takes 2-3 weeks to be received and mailed back to the patient along with the attestation. I will follow-up with the patient at a later date to make sure he is aware that he will receive a letter from [employer] that he must mail back for approval.  [name], [place] [phone]

## 2017-11-15 DIAGNOSIS — M5412 Radiculopathy, cervical region: Secondary | ICD-10-CM | POA: Diagnosis not present

## 2017-11-15 DIAGNOSIS — M519 Unspecified thoracic, thoracolumbar and lumbosacral intervertebral disc disorder: Secondary | ICD-10-CM | POA: Diagnosis not present

## 2017-11-15 DIAGNOSIS — M48062 Spinal stenosis, lumbar region with neurogenic claudication: Secondary | ICD-10-CM | POA: Diagnosis not present

## 2017-11-15 DIAGNOSIS — E1151 Type 2 diabetes mellitus with diabetic peripheral angiopathy without gangrene: Secondary | ICD-10-CM | POA: Diagnosis not present

## 2017-11-15 DIAGNOSIS — E1122 Type 2 diabetes mellitus with diabetic chronic kidney disease: Secondary | ICD-10-CM | POA: Diagnosis not present

## 2017-11-15 DIAGNOSIS — E1142 Type 2 diabetes mellitus with diabetic polyneuropathy: Secondary | ICD-10-CM | POA: Diagnosis not present

## 2017-11-16 DIAGNOSIS — N186 End stage renal disease: Secondary | ICD-10-CM | POA: Diagnosis not present

## 2017-11-16 DIAGNOSIS — E119 Type 2 diabetes mellitus without complications: Secondary | ICD-10-CM | POA: Diagnosis not present

## 2017-11-16 DIAGNOSIS — D631 Anemia in chronic kidney disease: Secondary | ICD-10-CM | POA: Diagnosis not present

## 2017-11-16 DIAGNOSIS — N2581 Secondary hyperparathyroidism of renal origin: Secondary | ICD-10-CM | POA: Diagnosis not present

## 2017-11-18 ENCOUNTER — Encounter: Payer: Self-pay | Admitting: *Deleted

## 2017-11-18 ENCOUNTER — Other Ambulatory Visit: Payer: Self-pay | Admitting: *Deleted

## 2017-11-18 DIAGNOSIS — E1122 Type 2 diabetes mellitus with diabetic chronic kidney disease: Secondary | ICD-10-CM | POA: Diagnosis not present

## 2017-11-18 DIAGNOSIS — M519 Unspecified thoracic, thoracolumbar and lumbosacral intervertebral disc disorder: Secondary | ICD-10-CM | POA: Diagnosis not present

## 2017-11-18 DIAGNOSIS — M48062 Spinal stenosis, lumbar region with neurogenic claudication: Secondary | ICD-10-CM | POA: Diagnosis not present

## 2017-11-18 DIAGNOSIS — M5412 Radiculopathy, cervical region: Secondary | ICD-10-CM | POA: Diagnosis not present

## 2017-11-18 DIAGNOSIS — E1142 Type 2 diabetes mellitus with diabetic polyneuropathy: Secondary | ICD-10-CM | POA: Diagnosis not present

## 2017-11-18 DIAGNOSIS — E1151 Type 2 diabetes mellitus with diabetic peripheral angiopathy without gangrene: Secondary | ICD-10-CM | POA: Diagnosis not present

## 2017-11-18 NOTE — Patient Outreach (Addendum)
[place] [facility]) Care Management  11/18/2017  [name] [birth year] [id number]    RN spoke with pt today and verified identifiers along with a verbal consent to speak with his wife [name]). RN introduced the [facility] services and inquired further on pt's needs. Pt states all he needs at this time is PT services. Pt feels all other medical issues are managed with no reported problems. RN encouraged pt to contact his provider Dr. [name] and inquire further on possible obtaining PT services. Pt with clear understanding and will consult his provider on this service. RN continue to inform pt that he is working with one of the pharmacy tech via [facility] Ms. Gilford Rile concerning his application for his Zetia (confirmed by pt's wife). RN inquired on any other needs concerning his medical issus or community resources that he may needs at this time. Pt indicated none and wife was informed to contact [facility] or this RN case manager if there are issues related to assisting pt with disease management services via [facility].   RN also updated the involved pharmacy tech Ms. Walker on pt's disposition with this RN Tourist information centre manager. Will close case and update pt's provider on pt's disposition with this RN case manager. CMA will be updated on case closure.  Raina Mina, RN Care Management Coordinator [place] Office [phone]

## 2017-11-19 DIAGNOSIS — E119 Type 2 diabetes mellitus without complications: Secondary | ICD-10-CM | POA: Diagnosis not present

## 2017-11-19 DIAGNOSIS — N2581 Secondary hyperparathyroidism of renal origin: Secondary | ICD-10-CM | POA: Diagnosis not present

## 2017-11-19 DIAGNOSIS — D631 Anemia in chronic kidney disease: Secondary | ICD-10-CM | POA: Diagnosis not present

## 2017-11-19 DIAGNOSIS — N186 End stage renal disease: Secondary | ICD-10-CM | POA: Diagnosis not present

## 2017-11-20 DIAGNOSIS — E1151 Type 2 diabetes mellitus with diabetic peripheral angiopathy without gangrene: Secondary | ICD-10-CM | POA: Diagnosis not present

## 2017-11-20 DIAGNOSIS — M519 Unspecified thoracic, thoracolumbar and lumbosacral intervertebral disc disorder: Secondary | ICD-10-CM | POA: Diagnosis not present

## 2017-11-20 DIAGNOSIS — E1142 Type 2 diabetes mellitus with diabetic polyneuropathy: Secondary | ICD-10-CM | POA: Diagnosis not present

## 2017-11-20 DIAGNOSIS — M48062 Spinal stenosis, lumbar region with neurogenic claudication: Secondary | ICD-10-CM | POA: Diagnosis not present

## 2017-11-20 DIAGNOSIS — M5412 Radiculopathy, cervical region: Secondary | ICD-10-CM | POA: Diagnosis not present

## 2017-11-20 DIAGNOSIS — E1122 Type 2 diabetes mellitus with diabetic chronic kidney disease: Secondary | ICD-10-CM | POA: Diagnosis not present

## 2017-11-21 DIAGNOSIS — N186 End stage renal disease: Secondary | ICD-10-CM | POA: Diagnosis not present

## 2017-11-21 DIAGNOSIS — N2581 Secondary hyperparathyroidism of renal origin: Secondary | ICD-10-CM | POA: Diagnosis not present

## 2017-11-21 DIAGNOSIS — E119 Type 2 diabetes mellitus without complications: Secondary | ICD-10-CM | POA: Diagnosis not present

## 2017-11-21 DIAGNOSIS — D631 Anemia in chronic kidney disease: Secondary | ICD-10-CM | POA: Diagnosis not present

## 2017-11-23 DIAGNOSIS — D631 Anemia in chronic kidney disease: Secondary | ICD-10-CM | POA: Diagnosis not present

## 2017-11-23 DIAGNOSIS — E119 Type 2 diabetes mellitus without complications: Secondary | ICD-10-CM | POA: Diagnosis not present

## 2017-11-23 DIAGNOSIS — N2581 Secondary hyperparathyroidism of renal origin: Secondary | ICD-10-CM | POA: Diagnosis not present

## 2017-11-23 DIAGNOSIS — N186 End stage renal disease: Secondary | ICD-10-CM | POA: Diagnosis not present

## 2017-11-25 ENCOUNTER — Ambulatory Visit: Payer: Medicare Other | Admitting: Endocrinology

## 2017-11-26 DIAGNOSIS — D631 Anemia in chronic kidney disease: Secondary | ICD-10-CM | POA: Diagnosis not present

## 2017-11-26 DIAGNOSIS — E119 Type 2 diabetes mellitus without complications: Secondary | ICD-10-CM | POA: Diagnosis not present

## 2017-11-26 DIAGNOSIS — N2581 Secondary hyperparathyroidism of renal origin: Secondary | ICD-10-CM | POA: Diagnosis not present

## 2017-11-26 DIAGNOSIS — N186 End stage renal disease: Secondary | ICD-10-CM | POA: Diagnosis not present

## 2017-11-28 DIAGNOSIS — N2581 Secondary hyperparathyroidism of renal origin: Secondary | ICD-10-CM | POA: Diagnosis not present

## 2017-11-28 DIAGNOSIS — E119 Type 2 diabetes mellitus without complications: Secondary | ICD-10-CM | POA: Diagnosis not present

## 2017-11-28 DIAGNOSIS — N186 End stage renal disease: Secondary | ICD-10-CM | POA: Diagnosis not present

## 2017-11-28 DIAGNOSIS — D631 Anemia in chronic kidney disease: Secondary | ICD-10-CM | POA: Diagnosis not present

## 2017-11-29 DIAGNOSIS — N186 End stage renal disease: Secondary | ICD-10-CM | POA: Diagnosis not present

## 2017-11-29 DIAGNOSIS — Z992 Dependence on renal dialysis: Secondary | ICD-10-CM | POA: Diagnosis not present

## 2017-11-29 DIAGNOSIS — E1129 Type 2 diabetes mellitus with other diabetic kidney complication: Secondary | ICD-10-CM | POA: Diagnosis not present

## 2017-11-30 DIAGNOSIS — D631 Anemia in chronic kidney disease: Secondary | ICD-10-CM | POA: Diagnosis not present

## 2017-11-30 DIAGNOSIS — N186 End stage renal disease: Secondary | ICD-10-CM | POA: Diagnosis not present

## 2017-11-30 DIAGNOSIS — E119 Type 2 diabetes mellitus without complications: Secondary | ICD-10-CM | POA: Diagnosis not present

## 2017-11-30 DIAGNOSIS — N2581 Secondary hyperparathyroidism of renal origin: Secondary | ICD-10-CM | POA: Diagnosis not present

## 2017-12-02 ENCOUNTER — Other Ambulatory Visit: Payer: Self-pay | Admitting: Pharmacy Technician

## 2017-12-02 DIAGNOSIS — E1351 Other specified diabetes mellitus with diabetic peripheral angiopathy without gangrene: Secondary | ICD-10-CM | POA: Diagnosis not present

## 2017-12-02 DIAGNOSIS — L84 Corns and callosities: Secondary | ICD-10-CM | POA: Diagnosis not present

## 2017-12-02 DIAGNOSIS — L602 Onychogryphosis: Secondary | ICD-10-CM | POA: Diagnosis not present

## 2017-12-02 NOTE — Patient Outreach (Signed)
[facility]) Care Management  12/02/2017  [name] 04-08-1951 [id number]  Successful outreach call to [employer] in reference to application mailed for [place]. The application has been received and was mailed back on 11/27/2017 along with the attestation to the home of the patient. Once the attestation is signed, dated, and mailed back to [employer] along with the application the patient should be approved.  I contacted [name] who is authorized to speak on the patient's behalf to update her of the status of the application. They will be on the lookout for the letter from [place] and she will contact me once it's received. If I have not heard from her within the next 2 weeks I will follow up with them again.  [name], [place] [phone]

## 2017-12-03 DIAGNOSIS — D631 Anemia in chronic kidney disease: Secondary | ICD-10-CM | POA: Diagnosis not present

## 2017-12-03 DIAGNOSIS — E119 Type 2 diabetes mellitus without complications: Secondary | ICD-10-CM | POA: Diagnosis not present

## 2017-12-03 DIAGNOSIS — N186 End stage renal disease: Secondary | ICD-10-CM | POA: Diagnosis not present

## 2017-12-03 DIAGNOSIS — N2581 Secondary hyperparathyroidism of renal origin: Secondary | ICD-10-CM | POA: Diagnosis not present

## 2017-12-05 DIAGNOSIS — N2581 Secondary hyperparathyroidism of renal origin: Secondary | ICD-10-CM | POA: Diagnosis not present

## 2017-12-05 DIAGNOSIS — D631 Anemia in chronic kidney disease: Secondary | ICD-10-CM | POA: Diagnosis not present

## 2017-12-05 DIAGNOSIS — E119 Type 2 diabetes mellitus without complications: Secondary | ICD-10-CM | POA: Diagnosis not present

## 2017-12-05 DIAGNOSIS — N186 End stage renal disease: Secondary | ICD-10-CM | POA: Diagnosis not present

## 2017-12-07 DIAGNOSIS — N186 End stage renal disease: Secondary | ICD-10-CM | POA: Diagnosis not present

## 2017-12-07 DIAGNOSIS — E119 Type 2 diabetes mellitus without complications: Secondary | ICD-10-CM | POA: Diagnosis not present

## 2017-12-07 DIAGNOSIS — D631 Anemia in chronic kidney disease: Secondary | ICD-10-CM | POA: Diagnosis not present

## 2017-12-07 DIAGNOSIS — N2581 Secondary hyperparathyroidism of renal origin: Secondary | ICD-10-CM | POA: Diagnosis not present

## 2017-12-10 DIAGNOSIS — N2581 Secondary hyperparathyroidism of renal origin: Secondary | ICD-10-CM | POA: Diagnosis not present

## 2017-12-10 DIAGNOSIS — D631 Anemia in chronic kidney disease: Secondary | ICD-10-CM | POA: Diagnosis not present

## 2017-12-10 DIAGNOSIS — E119 Type 2 diabetes mellitus without complications: Secondary | ICD-10-CM | POA: Diagnosis not present

## 2017-12-10 DIAGNOSIS — N186 End stage renal disease: Secondary | ICD-10-CM | POA: Diagnosis not present

## 2017-12-11 ENCOUNTER — Ambulatory Visit: Payer: Medicare Other | Admitting: Endocrinology

## 2017-12-11 DIAGNOSIS — Z992 Dependence on renal dialysis: Secondary | ICD-10-CM | POA: Diagnosis not present

## 2017-12-11 DIAGNOSIS — N186 End stage renal disease: Secondary | ICD-10-CM | POA: Diagnosis not present

## 2017-12-11 DIAGNOSIS — I871 Compression of vein: Secondary | ICD-10-CM | POA: Diagnosis not present

## 2017-12-11 DIAGNOSIS — T82858A Stenosis of vascular prosthetic devices, implants and grafts, initial encounter: Secondary | ICD-10-CM | POA: Diagnosis not present

## 2017-12-12 DIAGNOSIS — N2581 Secondary hyperparathyroidism of renal origin: Secondary | ICD-10-CM | POA: Diagnosis not present

## 2017-12-12 DIAGNOSIS — D631 Anemia in chronic kidney disease: Secondary | ICD-10-CM | POA: Diagnosis not present

## 2017-12-12 DIAGNOSIS — E119 Type 2 diabetes mellitus without complications: Secondary | ICD-10-CM | POA: Diagnosis not present

## 2017-12-12 DIAGNOSIS — N186 End stage renal disease: Secondary | ICD-10-CM | POA: Diagnosis not present

## 2017-12-13 DIAGNOSIS — L83 Acanthosis nigricans: Secondary | ICD-10-CM | POA: Diagnosis not present

## 2017-12-13 DIAGNOSIS — D485 Neoplasm of uncertain behavior of skin: Secondary | ICD-10-CM | POA: Diagnosis not present

## 2017-12-14 DIAGNOSIS — N2581 Secondary hyperparathyroidism of renal origin: Secondary | ICD-10-CM | POA: Diagnosis not present

## 2017-12-14 DIAGNOSIS — E119 Type 2 diabetes mellitus without complications: Secondary | ICD-10-CM | POA: Diagnosis not present

## 2017-12-14 DIAGNOSIS — N186 End stage renal disease: Secondary | ICD-10-CM | POA: Diagnosis not present

## 2017-12-14 DIAGNOSIS — D631 Anemia in chronic kidney disease: Secondary | ICD-10-CM | POA: Diagnosis not present

## 2017-12-16 ENCOUNTER — Ambulatory Visit: Payer: Self-pay | Admitting: Pharmacy Technician

## 2017-12-17 ENCOUNTER — Other Ambulatory Visit: Payer: Self-pay | Admitting: Pharmacy Technician

## 2017-12-17 DIAGNOSIS — N2581 Secondary hyperparathyroidism of renal origin: Secondary | ICD-10-CM | POA: Diagnosis not present

## 2017-12-17 DIAGNOSIS — E119 Type 2 diabetes mellitus without complications: Secondary | ICD-10-CM | POA: Diagnosis not present

## 2017-12-17 DIAGNOSIS — D631 Anemia in chronic kidney disease: Secondary | ICD-10-CM | POA: Diagnosis not present

## 2017-12-17 DIAGNOSIS — N186 End stage renal disease: Secondary | ICD-10-CM | POA: Diagnosis not present

## 2017-12-17 NOTE — Patient Outreach (Signed)
[facility]) Care Management  12/17/2017  [name] Jul 28, 1951 [id number]  Outgoing call to [employer] patient assistance program in reference to an application submitted for Januvia. The application and attestation was mailed to the patient on 11/27/2017 and they have not received it back.  Successful outreach call to the spouse of [name] who is authorized to speak on his behalf. She was under the assumption that the letter was coming from [facility] so she hasn't paid attention to the other mail that they have received. She is going to look through the mail for the [employer] envelope and will call me back once she finds it.  [name], [place] [phone]

## 2017-12-19 DIAGNOSIS — E119 Type 2 diabetes mellitus without complications: Secondary | ICD-10-CM | POA: Diagnosis not present

## 2017-12-19 DIAGNOSIS — D631 Anemia in chronic kidney disease: Secondary | ICD-10-CM | POA: Diagnosis not present

## 2017-12-19 DIAGNOSIS — N186 End stage renal disease: Secondary | ICD-10-CM | POA: Diagnosis not present

## 2017-12-19 DIAGNOSIS — N2581 Secondary hyperparathyroidism of renal origin: Secondary | ICD-10-CM | POA: Diagnosis not present

## 2017-12-21 DIAGNOSIS — N2581 Secondary hyperparathyroidism of renal origin: Secondary | ICD-10-CM | POA: Diagnosis not present

## 2017-12-21 DIAGNOSIS — D631 Anemia in chronic kidney disease: Secondary | ICD-10-CM | POA: Diagnosis not present

## 2017-12-21 DIAGNOSIS — E119 Type 2 diabetes mellitus without complications: Secondary | ICD-10-CM | POA: Diagnosis not present

## 2017-12-21 DIAGNOSIS — N186 End stage renal disease: Secondary | ICD-10-CM | POA: Diagnosis not present

## 2017-12-22 ENCOUNTER — Other Ambulatory Visit: Payer: Self-pay | Admitting: Internal Medicine

## 2017-12-23 ENCOUNTER — Other Ambulatory Visit: Payer: Self-pay | Admitting: Pharmacy Technician

## 2017-12-23 NOTE — Patient Outreach (Signed)
[facility]) Care Management  12/23/2017  OWENN ROTHERMEL December 12, 1950 [id number]   Taking over [employer] patient assistance referral from [place].  Successful outreach call to patients wife, [name], HIPAA identifiers verified. Mrs. Rowland states that she thinks she came across the [employer], however she has been sick. She would read over the letter and call me back 03/26.  If patient's spouse has not returned call by 03/26, I will re-contact 03/27.  Maud Deed [place], [place] Management [phone]

## 2017-12-24 DIAGNOSIS — D631 Anemia in chronic kidney disease: Secondary | ICD-10-CM | POA: Diagnosis not present

## 2017-12-24 DIAGNOSIS — N2581 Secondary hyperparathyroidism of renal origin: Secondary | ICD-10-CM | POA: Diagnosis not present

## 2017-12-24 DIAGNOSIS — N186 End stage renal disease: Secondary | ICD-10-CM | POA: Diagnosis not present

## 2017-12-24 DIAGNOSIS — E119 Type 2 diabetes mellitus without complications: Secondary | ICD-10-CM | POA: Diagnosis not present

## 2017-12-25 ENCOUNTER — Other Ambulatory Visit: Payer: Self-pay | Admitting: Pharmacy Technician

## 2017-12-25 NOTE — Patient Outreach (Signed)
[place] [facility]) Care Management  12/25/2017  JACADEN FORBUSH December 19, 1950 [id number]  Unsuccessful outreach call to Mr. Greens wife, [name], HIPAA compliant voicemail left.  Due to lack of consistent engagement with patient and spouse, will route message to [place] for case closure.  Maud Deed [place], [place] Management [phone]

## 2017-12-26 ENCOUNTER — Other Ambulatory Visit: Payer: Self-pay | Admitting: Pharmacist

## 2017-12-26 DIAGNOSIS — N2581 Secondary hyperparathyroidism of renal origin: Secondary | ICD-10-CM | POA: Diagnosis not present

## 2017-12-26 DIAGNOSIS — N186 End stage renal disease: Secondary | ICD-10-CM | POA: Diagnosis not present

## 2017-12-26 DIAGNOSIS — D631 Anemia in chronic kidney disease: Secondary | ICD-10-CM | POA: Diagnosis not present

## 2017-12-26 DIAGNOSIS — E119 Type 2 diabetes mellitus without complications: Secondary | ICD-10-CM | POA: Diagnosis not present

## 2017-12-26 NOTE — Patient Outreach (Signed)
[facility]) Care Management  12/26/2017  MONROE QIN [birth year] [id number]  Received an in-basket message from [place] that patient's spouse had not answered call and did not return call regarding [employer] patient assistance as indicated in 12/23/17 note.    Patient had requested medication patient assistance evaluation in December 2018 and [facility] was assisting him with application process for ezetimibe through [employer] Patient Assistance.   Based on notes from 06/03/78 application and attestation were mailed back to patient by [employer] the end of February 2019.  Unfortunately, patient/spouse may not have completed attestation form based on 12/02/17 and 12/17/17 notes.  As there has been difficulty maintaining contact with patient, will close this case at this time.   Plan:  Pharmacy episode closed.   MD closure letter sent.   Patient can be re-referred to [facility] if new needs arise.   Karrie Meres, PharmD, [place] [phone]

## 2017-12-27 ENCOUNTER — Other Ambulatory Visit: Payer: Self-pay | Admitting: Pharmacy Technician

## 2017-12-27 NOTE — Patient Outreach (Signed)
[place] [facility]) Care Management  12/27/2017  [name] 1951/08/07 [id number]  Successful outreach call to patient's wife, [name], [place] identifiers verified.  I had a missed call and voicemail from [place] in reference to [employer].  Upon calling her back, Mrs. Rachal had me assist her with answering the required questions on the letter. She stated that she would have her husband sign it and she would mail it back in.  Will follow up with patient and spouse in about 2 weeks when, received update from [employer]. [place], [place] Management [phone]

## 2017-12-28 DIAGNOSIS — N186 End stage renal disease: Secondary | ICD-10-CM | POA: Diagnosis not present

## 2017-12-28 DIAGNOSIS — N2581 Secondary hyperparathyroidism of renal origin: Secondary | ICD-10-CM | POA: Diagnosis not present

## 2017-12-28 DIAGNOSIS — D631 Anemia in chronic kidney disease: Secondary | ICD-10-CM | POA: Diagnosis not present

## 2017-12-28 DIAGNOSIS — E119 Type 2 diabetes mellitus without complications: Secondary | ICD-10-CM | POA: Diagnosis not present

## 2017-12-30 DIAGNOSIS — E1129 Type 2 diabetes mellitus with other diabetic kidney complication: Secondary | ICD-10-CM | POA: Diagnosis not present

## 2017-12-30 DIAGNOSIS — N186 End stage renal disease: Secondary | ICD-10-CM | POA: Diagnosis not present

## 2017-12-30 DIAGNOSIS — Z992 Dependence on renal dialysis: Secondary | ICD-10-CM | POA: Diagnosis not present

## 2017-12-31 DIAGNOSIS — N2581 Secondary hyperparathyroidism of renal origin: Secondary | ICD-10-CM | POA: Diagnosis not present

## 2017-12-31 DIAGNOSIS — N186 End stage renal disease: Secondary | ICD-10-CM | POA: Diagnosis not present

## 2017-12-31 DIAGNOSIS — D631 Anemia in chronic kidney disease: Secondary | ICD-10-CM | POA: Diagnosis not present

## 2017-12-31 DIAGNOSIS — E119 Type 2 diabetes mellitus without complications: Secondary | ICD-10-CM | POA: Diagnosis not present

## 2018-01-01 ENCOUNTER — Ambulatory Visit: Payer: Medicare Other | Admitting: Endocrinology

## 2018-01-01 ENCOUNTER — Other Ambulatory Visit: Payer: Self-pay

## 2018-01-01 ENCOUNTER — Ambulatory Visit (HOSPITAL_COMMUNITY)
Admission: RE | Admit: 2018-01-01 | Discharge: 2018-01-01 | Disposition: A | Discharge status: Home / Self Care | Payer: Medicare Other | Source: Ambulatory Visit | Attending: Internal Medicine | Admitting: Internal Medicine

## 2018-01-01 VITALS — BP 118/58 | HR 94 | Wt 225.2 lb

## 2018-01-01 DIAGNOSIS — F329 Major depressive disorder, single episode, unspecified: Secondary | ICD-10-CM | POA: Insufficient documentation

## 2018-01-01 DIAGNOSIS — K219 Gastro-esophageal reflux disease without esophagitis: Secondary | ICD-10-CM | POA: Diagnosis not present

## 2018-01-01 DIAGNOSIS — E1122 Type 2 diabetes mellitus with diabetic chronic kidney disease: Secondary | ICD-10-CM | POA: Diagnosis not present

## 2018-01-01 DIAGNOSIS — I132 Hypertensive heart and chronic kidney disease with heart failure and with stage 5 chronic kidney disease, or end stage renal disease: Secondary | ICD-10-CM | POA: Insufficient documentation

## 2018-01-01 DIAGNOSIS — I428 Other cardiomyopathies: Secondary | ICD-10-CM | POA: Diagnosis not present

## 2018-01-01 DIAGNOSIS — F0390 Unspecified dementia without behavioral disturbance: Secondary | ICD-10-CM | POA: Insufficient documentation

## 2018-01-01 DIAGNOSIS — I5022 Chronic systolic (congestive) heart failure: Secondary | ICD-10-CM | POA: Diagnosis not present

## 2018-01-01 DIAGNOSIS — E785 Hyperlipidemia, unspecified: Secondary | ICD-10-CM | POA: Insufficient documentation

## 2018-01-01 DIAGNOSIS — M48062 Spinal stenosis, lumbar region with neurogenic claudication: Secondary | ICD-10-CM | POA: Insufficient documentation

## 2018-01-01 DIAGNOSIS — M5412 Radiculopathy, cervical region: Secondary | ICD-10-CM | POA: Insufficient documentation

## 2018-01-01 DIAGNOSIS — N400 Enlarged prostate without lower urinary tract symptoms: Secondary | ICD-10-CM | POA: Diagnosis not present

## 2018-01-01 DIAGNOSIS — E669 Obesity, unspecified: Secondary | ICD-10-CM | POA: Insufficient documentation

## 2018-01-01 DIAGNOSIS — J449 Chronic obstructive pulmonary disease, unspecified: Secondary | ICD-10-CM | POA: Insufficient documentation

## 2018-01-01 DIAGNOSIS — Z8546 Personal history of malignant neoplasm of prostate: Secondary | ICD-10-CM | POA: Diagnosis not present

## 2018-01-01 DIAGNOSIS — E1140 Type 2 diabetes mellitus with diabetic neuropathy, unspecified: Secondary | ICD-10-CM | POA: Insufficient documentation

## 2018-01-01 DIAGNOSIS — I952 Hypotension due to drugs: Secondary | ICD-10-CM

## 2018-01-01 DIAGNOSIS — I255 Ischemic cardiomyopathy: Secondary | ICD-10-CM | POA: Diagnosis not present

## 2018-01-01 DIAGNOSIS — Z7982 Long term (current) use of aspirin: Secondary | ICD-10-CM | POA: Diagnosis not present

## 2018-01-01 DIAGNOSIS — I471 Supraventricular tachycardia: Secondary | ICD-10-CM | POA: Diagnosis not present

## 2018-01-01 DIAGNOSIS — I5042 Chronic combined systolic (congestive) and diastolic (congestive) heart failure: Secondary | ICD-10-CM | POA: Diagnosis not present

## 2018-01-01 DIAGNOSIS — Z992 Dependence on renal dialysis: Secondary | ICD-10-CM

## 2018-01-01 DIAGNOSIS — M109 Gout, unspecified: Secondary | ICD-10-CM | POA: Diagnosis not present

## 2018-01-01 DIAGNOSIS — N186 End stage renal disease: Secondary | ICD-10-CM

## 2018-01-01 DIAGNOSIS — Z79899 Other long term (current) drug therapy: Secondary | ICD-10-CM | POA: Insufficient documentation

## 2018-01-01 DIAGNOSIS — G4733 Obstructive sleep apnea (adult) (pediatric): Secondary | ICD-10-CM | POA: Diagnosis not present

## 2018-01-01 DIAGNOSIS — I2510 Atherosclerotic heart disease of native coronary artery without angina pectoris: Secondary | ICD-10-CM

## 2018-01-01 DIAGNOSIS — Z955 Presence of coronary angioplasty implant and graft: Secondary | ICD-10-CM | POA: Diagnosis not present

## 2018-01-01 DIAGNOSIS — E0590 Thyrotoxicosis, unspecified without thyrotoxic crisis or storm: Secondary | ICD-10-CM | POA: Diagnosis not present

## 2018-01-01 DIAGNOSIS — N2581 Secondary hyperparathyroidism of renal origin: Secondary | ICD-10-CM | POA: Insufficient documentation

## 2018-01-01 DIAGNOSIS — I252 Old myocardial infarction: Secondary | ICD-10-CM | POA: Diagnosis not present

## 2018-01-01 NOTE — Progress Notes (Signed)
Advanced Heart Failure Clinic Note   Patient ID: [name], male   DOB: 02/24/1951, 67 y.o.   MRN: [id number]  PCP: Dr [name] Nephrologist: Dr Mercy Moore HF: Dr. [name]   HPI: [name] is 67 year old male with PMH: obesity, DM, COPD on nighttime oxygen, sleep apnea on CPAP, ESRD- HD, CAD, S/P Taxus drug-eluting stent to the right coronary in September 9798, chronic systolic/diastolic heart failure (Unable to tolerate statins so placed on Welchol), prostate CA s/p XRT   Evaluated at [facility] for kidney transplant but he was not felt to be candidate (11/2012) due to poor mobility, DM, and coronary disease.    In 11/17 had NSTEMI underwent PCI LAD. RCA had long 80-90% but felt to be not amenable to PCI due to tortuosity. Admitted in 1/18 with SVT and small NSTEMI. Cath films reviewed again with Dr. Martinique and RCA not approachable percutaneously.   Admitted 12/27/16 after transient chest pain and troponin came back mildly elevated in office. Troponin normal in ED here and thought stable for home. Pt thought he actually may have had a panic attack.   Admitted 10/18 with rectal bleeding felt due to severe radiation proctitis with ulcer on flex sig. Plavix stopped. Remains on ASA 81.   Today he returns for HF follow up. Overall feeling ok.  He says he has chest pain every now and then. He says it only lasts for a split second. Usually about 1x per month. Denies SOB/PND/Orthopnea. Continues on HD Tues/Thur /Sat. Dry weight 222-225 pounds. SBP at home has been low 60-70.He continues to walk with a walker. Appetite ok. No fever or chills. Taking all medications  Echo 12/12: EF 55% Myoview 10/15/12: EF 33%. LV Wall Motion: There is global hypokinesis. The LV is markedly enlarged. Small fixed apical defect.  cMRI 02/19/13: EF 37% dilated LV. diffuse HK Echo 6/15: EF 25-30%  Echo 4/16: EF 50-55% Echo 10/17/16 LVEF 40-45%, Grade 2 DD. Trivial MR, Mild LAE, Normal RV.   Past Medical History:  Diagnosis  Date  . Allergic rhinitis, cause unspecified 02/24/2014  . Anemia 06/16/2011  . BENIGN PROSTATIC HYPERTROPHY 10/14/2009  . CAD, NATIVE VESSEL 02/06/2009   a. 06/2007 s/p Taxus DES to the RCA;  b. 08/2016 NSTEMI in setting of SVT/PCI: LM 30ost, LAD 94m (3.0x16 Synergy DES), LCX 5m, OM1 60, RI 40, RCA 70p/m, 36m - not amenable to PCI.  [name] Cervical radiculopathy, chronic 02/23/2016   Right c5-6 by NCS/EMG  . CHEST PAIN 03/29/2010  . Chronic combined systolic (congestive) and diastolic (congestive) heart failure ([place])    a. 10/2016 Echo: EF 40-45%, Gr2 DD. mildly dil LA.  [name] COLONIC POLYPS, HX OF 10/14/2009  . Dementia [id number]  . DEPRESSION 10/14/2009  . Depression 09/24/2015  . DIABETES MELLITUS, TYPE II 02/01/2010  . DIZZINESS 07/17/2010  . DYSLIPIDEMIA 06/18/2007  . ESRD (end stage renal disease) on dialysis ([place]) 08/04/2010   "TTS;  " (04/18/2015)  . FOOT PAIN 08/12/2008  . GAIT DISTURBANCE 03/03/2010  . GASTROENTERITIS, VIRAL 10/14/2009  . GERD 06/18/2007  . GOITER, MULTINODULAR 12/26/2007  . GOUT 06/18/2007  . GYNECOMASTIA 07/17/2010  . Hemodialysis access, fistula mature [facility])    Dialysis T-Th-Sa ([place]) Right upper arm fistula  . Hyperlipidemia 10/16/2011  . Hyperparathyroidism, secondary ([place]) 06/16/2011  . HYPERTENSION 06/18/2007  . Hyperthyroidism   . Hypocalcemia 06/07/2010  . Ischemic cardiomyopathy    a. 10/2016 Echo: EF 40-45%.  . Lumbar stenosis with neurogenic claudication   . NECK PAIN  07/31/2010  . ONYCHOMYCOSIS, TOENAILS 12/26/2007  . OSA on CPAP 10/16/2011  . Other malaise and fatigue 11/24/2009  . PERIPHERAL NEUROPATHY 06/18/2007  . Prostate cancer ([place])   . PSVT (paroxysmal supraventricular tachycardia) ([place])    a. 93/7169 complicated by NSTEMI;  b. 11/2016 Treated w/ adenosine in ED;  c. 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  [name] PULMONARY NODULE, RIGHT LOWER LOBE 06/08/2009  . Sleep apnea    cpap machine and o2  . TRANSAMINASES, SERUM, ELEVATED 02/01/2010  . Transfusion  history    none recent  . Unspecified hypotension 01/30/2010    Current Outpatient Medications  Medication Sig Dispense Refill  . acetaminophen (TYLENOL) 325 MG tablet Take 650 mg by mouth every 6 (six) hours as needed for mild pain or headache.     . allopurinol (ZYLOPRIM) 100 MG tablet TAKE ONE TABLET BY MOUTH ONCE DAILY 90 tablet 3  . aspirin EC 81 MG tablet Take 81 mg by mouth daily.    . citalopram (CELEXA) 10 MG tablet Take 1 tablet (10 mg total) by mouth at bedtime. 90 tablet 3  . cyclobenzaprine (FLEXERIL) 10 MG tablet Take 1 tablet (10 mg total) by mouth 3 (three) times daily as needed for muscle spasms. 30 tablet 0  . ezetimibe (ZETIA) 10 MG tablet TAKE ONE TABLET BY MOUTH ONCE DAILY 90 tablet 3  . gabapentin (NEURONTIN) 300 MG capsule Take 1 capsule (300 mg total) by mouth at bedtime. (Patient taking differently: Take 600 mg by mouth at bedtime. )    . hydroxypropyl methylcellulose / hypromellose (ISOPTO TEARS / GONIOVISC) 2.5 % ophthalmic solution Place 1 drop into both eyes 3 (three) times daily as needed for dry eyes. 15 mL 11  . lanthanum (FOSRENOL) 1000 MG chewable tablet Chew 1,000 mg by mouth 2 (two) times daily.    . mesalamine (CANASA) 1000 MG suppository Place 0.5 suppositories (500 mg total) rectally at bedtime. (Patient taking differently: Place 500 mg rectally at bedtime as needed (for flare up). ) 30 suppository 2  . methimazole (TAPAZOLE) 5 MG tablet Take 1 tablet (5 mg total) by mouth daily. 30 tablet 11  . metoprolol tartrate (LOPRESSOR) 25 MG tablet Take 1 tablet (25 mg total) by mouth 2 (two) times daily. ON NON-HD DAYS 144 tablet 3  . midodrine (PROAMATINE) 10 MG tablet Take 1 tablet (10 mg total) by mouth See admin instructions. Once a day only on dialysis days (Tues/Thurs/Sat) 30 tablet 0  . multivitamin (RENA-VIT) TABS tablet Take 1 tablet by mouth daily. Reported on 10/24/2015    . oxyCODONE (OXY IR/ROXICODONE) 5 MG immediate release tablet Take 5 mg by mouth  every 6 (six) hours as needed. For pain.  0  . pantoprazole (PROTONIX) 40 MG tablet Take 40 mg by mouth every evening.     . polyethylene glycol (MIRALAX / GLYCOLAX) packet Take 17 g by mouth daily. (Patient taking differently: Take 17 g by mouth daily as needed for mild constipation. ) 14 each 0  . SENSIPAR 90 MG tablet Take 90 mg by mouth 2 (two) times daily.    Earnestine Mealing 625 MG tablet TAKE THREE TABLETS BY MOUTH TWICE DAILY WITH MEALS 180 tablet 11  . zolpidem (AMBIEN) 5 MG tablet Take 1 tablet (5 mg total) by mouth at bedtime as needed for sleep. 90 tablet 1  . doxycycline (VIBRA-TABS) 100 MG tablet Take 1 tablet (100 mg total) by mouth 2 (two) times daily. 20 tablet 0   No current facility-administered  medications for this encounter.     Vitals:   01/01/18 1422  BP: (!) 118/58  Pulse: 94  SpO2: 96%    PHYSICAL EXAM: General:  Well appearing. No resp difficulty. Walked in the clinic with a walker.  HEENT: normal Neck: supple. no JVD. Carotids 2+ bilat; no bruits. No lymphadenopathy or thryomegaly appreciated. Cor: PMI nondisplaced. Regular rate & rhythm. No rubs, gallops or murmurs. Lungs: clear Abdomen: soft, nontender, nondistended. No hepatosplenomegaly. No bruits or masses. Good bowel sounds. Extremities: no cyanosis, clubbing, rash, edema RUE AVF + bruit/thrill Neuro: alert & orientedx3, cranial nerves grossly intact. moves all 4 extremities w/o difficulty. Affect pleasant  ASSESSMENT & PLAN:  1. Chronic Systolic Heart Fatigue:  Mixed ICM/NICM, EF 37% per c-MRI. EF 25-30% (Echo 6/15) Most recent EF 40-45% in 1/18 NYHA II. Volume status stable.  - Volume status per HD.   - Off ACE/ARB/ARNI due to ESRD.  - Stop BB with hypotension. Continue low-dose b-blocker as BP toelrtes 2. CAD - no s/s ischemia  - s/p PCI of LAD 11/17.  -  Recurrent NSTEMI in 1/18 due to SVT. Cath films reviewed has 80-90% long lesion in RCA but not amenable to PCI due to tortuosity -  Continue ASA  81 mg daily. Has not been able to tolerate statins due to myositis. Continue Zetia and Welchol.  3. Hypotesion On midodrine. Having ongoing issues with hypotension. Will stop lopressor.  4. ESRD -  HD Tues/Thur/Sat 5. PSVT - Resolved s/p ablation for AVNRT on 11/26/16 with resolution of symptoms.   Follow up in 6 months.  Darrick Grinder, NP  2:47 PM

## 2018-01-01 NOTE — Patient Instructions (Signed)
STOP Lopressor.  Follow up with Dr.Bensimhon in 6 months **please call our office at [phone] in September for October follow up**

## 2018-01-02 DIAGNOSIS — N186 End stage renal disease: Secondary | ICD-10-CM | POA: Diagnosis not present

## 2018-01-02 DIAGNOSIS — D631 Anemia in chronic kidney disease: Secondary | ICD-10-CM | POA: Diagnosis not present

## 2018-01-02 DIAGNOSIS — N2581 Secondary hyperparathyroidism of renal origin: Secondary | ICD-10-CM | POA: Diagnosis not present

## 2018-01-02 DIAGNOSIS — E119 Type 2 diabetes mellitus without complications: Secondary | ICD-10-CM | POA: Diagnosis not present

## 2018-01-04 DIAGNOSIS — N2581 Secondary hyperparathyroidism of renal origin: Secondary | ICD-10-CM | POA: Diagnosis not present

## 2018-01-04 DIAGNOSIS — N186 End stage renal disease: Secondary | ICD-10-CM | POA: Diagnosis not present

## 2018-01-04 DIAGNOSIS — D631 Anemia in chronic kidney disease: Secondary | ICD-10-CM | POA: Diagnosis not present

## 2018-01-04 DIAGNOSIS — E119 Type 2 diabetes mellitus without complications: Secondary | ICD-10-CM | POA: Diagnosis not present

## 2018-01-06 ENCOUNTER — Ambulatory Visit (INDEPENDENT_AMBULATORY_CARE_PROVIDER_SITE_OTHER): Payer: Medicare Other | Admitting: Endocrinology

## 2018-01-06 ENCOUNTER — Encounter: Payer: Self-pay | Admitting: Endocrinology

## 2018-01-06 VITALS — BP 182/94 | HR 90 | Wt 228.0 lb

## 2018-01-06 DIAGNOSIS — E0520 Thyrotoxicosis with toxic multinodular goiter without thyrotoxic crisis or storm: Secondary | ICD-10-CM | POA: Diagnosis not present

## 2018-01-06 DIAGNOSIS — I2510 Atherosclerotic heart disease of native coronary artery without angina pectoris: Secondary | ICD-10-CM | POA: Diagnosis not present

## 2018-01-06 DIAGNOSIS — E1121 Type 2 diabetes mellitus with diabetic nephropathy: Secondary | ICD-10-CM | POA: Diagnosis not present

## 2018-01-06 LAB — TSH: TSH: 0.39 u[IU]/mL (ref 0.35–4.50)

## 2018-01-06 LAB — T4, FREE: Free T4: 0.76 ng/dL (ref 0.60–1.60)

## 2018-01-06 LAB — POCT GLYCOSYLATED HEMOGLOBIN (HGB A1C): Hemoglobin A1C: 6.2

## 2018-01-06 NOTE — Progress Notes (Signed)
Subjective:    Patient ID: [name], male    DOB: 12-May-1951, 67 y.o.   MRN: [id number]  HPI Pt has diabetes mellitus:  DM type: 2 Dx'ed: 3254 Complications: polyneuropathy, renal failure, and CAD.  Therapy: welchol DKA: never.  Severe hypoglycemia: never.  Pancreatitis: never.  Other: med needs have decreased with worsening of renal function.  Interval history:  no cbg record, but states cbg's vary from 90-120.  There is no trend throughout the day.  He denies hypoglycemia.   Pt also has Hyperthyroidism (due to a multinodular goiter; dx'ed 2009; bx then was benign; nuc med scan showed very low uptake, so tapazole was chosen as rx).  He takes tapazole as rx'ed.  Past Medical History:  Diagnosis Date  . Allergic rhinitis, cause unspecified 02/24/2014  . Anemia 06/16/2011  . BENIGN PROSTATIC HYPERTROPHY 10/14/2009  . CAD, NATIVE VESSEL 02/06/2009   a. 06/2007 s/p Taxus DES to the RCA;  b. 08/2016 NSTEMI in setting of SVT/PCI: LM 30ost, LAD 74m (3.0x16 Synergy DES), LCX 39m, OM1 60, RI 40, RCA 70p/m, 41m - not amenable to PCI.  [name] Cervical radiculopathy, chronic 02/23/2016   Right c5-6 by NCS/EMG  . CHEST PAIN 03/29/2010  . Chronic combined systolic (congestive) and diastolic (congestive) heart failure ([place])    a. 10/2016 Echo: EF 40-45%, Gr2 DD. mildly dil LA.  [name] COLONIC POLYPS, HX OF 10/14/2009  . Dementia [id number]  . DEPRESSION 10/14/2009  . Depression 09/24/2015  . DIABETES MELLITUS, TYPE II 02/01/2010  . DIZZINESS 07/17/2010  . DYSLIPIDEMIA 06/18/2007  . ESRD (end stage renal disease) on dialysis ([place]) 08/04/2010   "TTS;  " (04/18/2015)  . FOOT PAIN 08/12/2008  . GAIT DISTURBANCE 03/03/2010  . GASTROENTERITIS, VIRAL 10/14/2009  . GERD 06/18/2007  . GOITER, MULTINODULAR 12/26/2007  . GOUT 06/18/2007  . GYNECOMASTIA 07/17/2010  . Hemodialysis access, fistula mature [facility])    Dialysis T-Th-Sa ([place]) Right upper arm fistula  . Hyperlipidemia 10/16/2011  .  Hyperparathyroidism, secondary ([place]) 06/16/2011  . HYPERTENSION 06/18/2007  . Hyperthyroidism   . Hypocalcemia 06/07/2010  . Ischemic cardiomyopathy    a. 10/2016 Echo: EF 40-45%.  . Lumbar stenosis with neurogenic claudication   . NECK PAIN 07/31/2010  . ONYCHOMYCOSIS, TOENAILS 12/26/2007  . OSA on CPAP 10/16/2011  . Other malaise and fatigue 11/24/2009  . PERIPHERAL NEUROPATHY 06/18/2007  . Prostate cancer ([place])   . PSVT (paroxysmal supraventricular tachycardia) ([place])    a. 06/4075 complicated by NSTEMI;  b. 11/2016 Treated w/ adenosine in ED;  c. 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  [name] PULMONARY NODULE, RIGHT LOWER LOBE 06/08/2009  . Sleep apnea    cpap machine and o2  . TRANSAMINASES, SERUM, ELEVATED 02/01/2010  . Transfusion history    none recent  . Unspecified hypotension 01/30/2010    Past Surgical History:  Procedure Laterality Date  . ARTERIOVENOUS GRAFT PLACEMENT Right 2009   forearm/notes 02/01/2011  . AV FISTULA PLACEMENT  11/07/2011   Procedure: INSERTION OF ARTERIOVENOUS (AV) GORE-TEX GRAFT ARM;  Surgeon: Tinnie Gens, MD;  Location: [place];  Service: Vascular;  Laterality: Left;  . BACK SURGERY  1998  . BASCILIC VEIN TRANSPOSITION Right 02/27/2013   Procedure: BASCILIC VEIN TRANSPOSITION;  Surgeon: Mal Misty, MD;  Location: [place];  Service: Vascular;  Laterality: Right;  Right Basilic Vein Transposition   . CARDIAC CATHETERIZATION N/A 08/06/2016   Procedure: Left Heart Cath and Coronary Angiography;  Surgeon: Jolaine Artist, MD;  Location: [place] CV LAB;  Service: Cardiovascular;  Laterality: N/A;  . CARDIAC CATHETERIZATION N/A 08/07/2016   Procedure: Coronary/Graft Atherectomy-CSI LAD;  Surgeon: Peter M Martinique, MD;  Location: [place] CV LAB;  Service: Cardiovascular;  Laterality: N/A;  . CERVICAL SPINE SURGERY  2/09   "to repair nerve problems in my left arm"  . CHOLECYSTECTOMY    . COLONOSCOPY WITH PROPOFOL N/A 04/26/2017   Procedure: COLONOSCOPY WITH PROPOFOL;  Surgeon:  Otis Brace, MD;  Location: [place];  Service: Gastroenterology;  Laterality: N/A;  . CORONARY ANGIOPLASTY WITH STENT PLACEMENT  06/11/2008  . CORONARY ANGIOPLASTY WITH STENT PLACEMENT  06/2007   TAXUS stent to RCA/notes 01/31/2011  . ESOPHAGOGASTRODUODENOSCOPY  09/28/2011   Procedure: ESOPHAGOGASTRODUODENOSCOPY (EGD);  Surgeon: Missy Sabins, MD;  Location[facility] ENDOSCOPY;  Service: Endoscopy;  Laterality: N/A;  . ESOPHAGOGASTRODUODENOSCOPY N/A 04/07/2015   Procedure: ESOPHAGOGASTRODUODENOSCOPY (EGD);  Surgeon: Teena Irani, MD;  Location: [facility];  Service: Endoscopy;  Laterality: N/A;  . ESOPHAGOGASTRODUODENOSCOPY N/A 04/19/2015   Procedure: ESOPHAGOGASTRODUODENOSCOPY (EGD);  Surgeon: Arta Silence, MD;  Location: [facility] ENDOSCOPY;  Service: Endoscopy;  Laterality: N/A;  . FLEXIBLE SIGMOIDOSCOPY N/A 05/21/2017   Procedure: Conni Elliot;  Surgeon: Clarene Essex, MD;  Location: [place];  Service: Endoscopy;  Laterality: N/A;  . FLEXIBLE SIGMOIDOSCOPY Left 07/02/2017   Procedure: FLEXIBLE SIGMOIDOSCOPY;  Surgeon: Laurence Spates, MD;  Location: [place];  Service: Endoscopy;  Laterality: Left;  . FOREIGN BODY REMOVAL  09/2003   via upper endoscopy/notes 02/12/2011  . GIVENS CAPSULE STUDY  09/30/2011   Procedure: GIVENS CAPSULE STUDY;  Surgeon: Jeryl Columbia, MD;  Location[facility] ENDOSCOPY;  Service: Endoscopy;  Laterality: N/A;  . INSERTION OF DIALYSIS CATHETER Right 2014  . INSERTION OF DIALYSIS CATHETER Left 02/11/2013   Procedure: INSERTION OF DIALYSIS CATHETER;  Surgeon: Conrad Matamoras, MD;  Location: [place];  Service: Vascular;  Laterality: Left;  Ultrasound guided  . LUMBAR LAMINECTOMY/DECOMPRESSION MICRODISCECTOMY Bilateral 07/31/2017   Procedure: LAMINECTOMY AND FORAMINOTOMY- BILATERAL LUMBAR TWO- LUMBAR THREE;  Surgeon: Earnie Larsson, MD;  Location: [place];  Service: Neurosurgery;  Laterality: Bilateral;  LAMINECTOMY AND FORAMINOTOMY- BILATERAL LUMBAR 2- LUMBAR 3  . REMOVAL  OF A DIALYSIS CATHETER Right 02/11/2013   Procedure: REMOVAL OF A DIALYSIS CATHETER;  Surgeon: Conrad Shirley, MD;  Location: [place];  Service: Vascular;  Laterality: Right;  . SAVORY DILATION N/A 04/07/2015   Procedure: SAVORY DILATION;  Surgeon: Teena Irani, MD;  Location: [facility] ENDOSCOPY;  Service: Endoscopy;  Laterality: N/A;  . SHUNTOGRAM N/A 09/20/2011   Procedure: Earney Mallet;  Surgeon: Conrad Oak Forest, MD;  Location: [facility] CATH LAB;  Service: Cardiovascular;  Laterality: N/A;  . SVT ABLATION N/A 11/26/2016   Procedure: SVT Ablation;  Surgeon: Will Meredith Leeds, MD;  Location: [place] CV LAB;  Service: Cardiovascular;  Laterality: N/A;  . TONSILLECTOMY    . TOTAL KNEE ARTHROPLASTY Right 08/02/2015   Procedure: TOTAL KNEE ARTHROPLASTY;  Surgeon: Renette Butters, MD;  Location: [place];  Service: Orthopedics;  Laterality: Right;  . VENOGRAM N/A 01/26/2013   Procedure: VENOGRAM;  Surgeon: Angelia Mould, MD;  Location: [facility] CATH LAB;  Service: Cardiovascular;  Laterality: N/A;    Social History   Socioeconomic History  . Marital status: Married    Spouse name: Not on file  . Number of children: 3  . Years of education: 67  . Highest education level: Not on file  Occupational History  . Occupation: disabled    Employer: DISABLED  .  Occupation: formerly Lawyer for [employer].  Social Needs  . Financial resource strain: Not on file  . Food insecurity:    Worry: Not on file    Inability: Not on file  . Transportation needs:    Medical: Not on file    Non-medical: Not on file  Tobacco Use  . Smoking status: Former Smoker    Packs/day: 1.00    Years: 25.00    Pack years: 25.00    Types: Cigarettes    Last attempt to quit: 10/01/2005    Years since quitting: 12.2  . Smokeless tobacco: Former Systems developer    Quit date: 10/01/2005  . Tobacco comment: Quit smoking 2007 Smoked x 25 years 1/2 ppd.  Substance and Sexual Activity  . Alcohol use: No    Alcohol/week: 0.0 oz  . Drug use: No    . Sexual activity: Never  Lifestyle  . Physical activity:    Days per week: Not on file    Minutes per session: Not on file  . Stress: Not on file  Relationships  . Social connections:    Talks on phone: Not on file    Gets together: Not on file    Attends religious service: Not on file    Active member of club or organization: Not on file    Attends meetings of clubs or organizations: Not on file    Relationship status: Not on file  . Intimate partner violence:    Fear of current or ex partner: Not on file    Emotionally abused: Not on file    Physically abused: Not on file    Forced sexual activity: Not on file  Other Topics Concern  . Not on file  Social History Narrative   Lives with wife   Caffeine use: Tea sometimes   No coffee   Left handed     Current Outpatient Medications on File Prior to Visit  Medication Sig Dispense Refill  . acetaminophen (TYLENOL) 325 MG tablet Take 650 mg by mouth every 6 (six) hours as needed for mild pain or headache.     . allopurinol (ZYLOPRIM) 100 MG tablet TAKE ONE TABLET BY MOUTH ONCE DAILY 90 tablet 3  . aspirin EC 81 MG tablet Take 81 mg by mouth daily.    . citalopram (CELEXA) 10 MG tablet Take 1 tablet (10 mg total) by mouth at bedtime. 90 tablet 3  . cyclobenzaprine (FLEXERIL) 10 MG tablet Take 1 tablet (10 mg total) by mouth 3 (three) times daily as needed for muscle spasms. 30 tablet 0  . ezetimibe (ZETIA) 10 MG tablet TAKE ONE TABLET BY MOUTH ONCE DAILY 90 tablet 3  . gabapentin (NEURONTIN) 300 MG capsule Take 1 capsule (300 mg total) by mouth at bedtime. (Patient taking differently: Take 600 mg by mouth at bedtime. )    . hydroxypropyl methylcellulose / hypromellose (ISOPTO TEARS / GONIOVISC) 2.5 % ophthalmic solution Place 1 drop into both eyes 3 (three) times daily as needed for dry eyes. 15 mL 11  . lanthanum (FOSRENOL) 1000 MG chewable tablet Chew 1,000 mg by mouth 2 (two) times daily.    . mesalamine (CANASA) 1000 MG  suppository Place 0.5 suppositories (500 mg total) rectally at bedtime. (Patient taking differently: Place 500 mg rectally at bedtime as needed (for flare up). ) 30 suppository 2  . methimazole (TAPAZOLE) 5 MG tablet Take 1 tablet (5 mg total) by mouth daily. 30 tablet 11  . midodrine (PROAMATINE) 10 MG tablet  Take 1 tablet (10 mg total) by mouth See admin instructions. Once a day only on dialysis days (Tues/Thurs/Sat) 30 tablet 0  . multivitamin (RENA-VIT) TABS tablet Take 1 tablet by mouth daily. Reported on 10/24/2015    . oxyCODONE (OXY IR/ROXICODONE) 5 MG immediate release tablet Take 5 mg by mouth every 6 (six) hours as needed. For pain.  0  . pantoprazole (PROTONIX) 40 MG tablet Take 40 mg by mouth every evening.     . polyethylene glycol (MIRALAX / GLYCOLAX) packet Take 17 g by mouth daily. (Patient taking differently: Take 17 g by mouth daily as needed for mild constipation. ) 14 each 0  . SENSIPAR 90 MG tablet Take 90 mg by mouth 2 (two) times daily.    Earnestine Mealing 625 MG tablet TAKE THREE TABLETS BY MOUTH TWICE DAILY WITH MEALS 180 tablet 11  . zolpidem (AMBIEN) 5 MG tablet Take 1 tablet (5 mg total) by mouth at bedtime as needed for sleep. 90 tablet 1   No current facility-administered medications on file prior to visit.     Allergies  Allergen Reactions  . Cephalexin Swelling and Other (See Comments)    Tongue swelling  . Statins Other (See Comments)    Weak muscles  . Ciprofloxacin Rash    Family History  Problem Relation Age of Onset  . Heart disease Sister   . Thyroid nodules Sister   . Heart disease Father   . Diabetes Father   . Kidney failure Father   . Hypertension Father   . Healthy Child   . Healthy Child   . Healthy Child   . Cancer Neg Hx     BP (!) 182/94 (BP Location: Left Arm, Patient Position: Sitting, Cuff Size: Normal)   Pulse 90   Wt 228 lb (103.4 kg)   SpO2 93%   BMI 30.08 kg/m    Review of Systems Denies fever.  He reports small ulcer at the  left leg.      Objective:   Physical Exam VITAL SIGNS:  See vs page GENERAL: no distress.  Pulses: foot pulses are intact bilaterally.   MSK: no deformity of the feet or ankles.  CV: 1+ bilat edema of the legs. Skin:  5 mm ulcer at the left post distal leg.  No erythema or drainage.  normal color and temp on the feet and ankles Neuro: sensation is intact to touch on the feet and ankles, but decreased from normal. Ext: There is bilateral onychomycosis of the toenails.    Lab Results  Component Value Date   HGBA1C 6.2 01/06/2018    Lab Results  Component Value Date   CREATININE 8.02 (H) 07/26/2017   BUN 33 (H) 07/26/2017   NA 141 07/31/2017   K 4.2 07/31/2017   CL 102 07/26/2017   CO2 27 07/26/2017       Assessment & Plan:  HTN: is noted today Leg ulcer, new to me Hyperthyroidism: recheck today.  Type 2 DM: well-controlled.    Patient Instructions  Your blood pressure is high today.  Please continue to wirk with your kidney doctor, on this.   Please continue the same medications. Thyroid blood tests are requested for you today.  We'll let you know about the results. Keep the leg ulcer covered with antibiotic ointment, and a large bandaid.  Please call if it gets worse.    If ever you have fever while taking methimazole, stop it and call us, even if the reason is  obvious, because of the risk of a rare side-effect.  Please come back for a follow-up appointment in 4-5 months.

## 2018-01-06 NOTE — Patient Instructions (Addendum)
Your blood pressure is high today.  Please continue to wirk with your kidney doctor, on this.   Please continue the same medications. Thyroid blood tests are requested for you today.  We'll let you know about the results. Keep the leg ulcer covered with antibiotic ointment, and a large bandaid.  Please call if it gets worse.    If ever you have fever while taking methimazole, stop it and call us, even if the reason is obvious, because of the risk of a rare side-effect.  Please come back for a follow-up appointment in 4-5 months.

## 2018-01-07 DIAGNOSIS — D631 Anemia in chronic kidney disease: Secondary | ICD-10-CM | POA: Diagnosis not present

## 2018-01-07 DIAGNOSIS — N186 End stage renal disease: Secondary | ICD-10-CM | POA: Diagnosis not present

## 2018-01-07 DIAGNOSIS — N2581 Secondary hyperparathyroidism of renal origin: Secondary | ICD-10-CM | POA: Diagnosis not present

## 2018-01-07 DIAGNOSIS — E119 Type 2 diabetes mellitus without complications: Secondary | ICD-10-CM | POA: Diagnosis not present

## 2018-01-09 ENCOUNTER — Ambulatory Visit: Payer: Self-pay | Admitting: Pharmacy Technician

## 2018-01-09 DIAGNOSIS — E119 Type 2 diabetes mellitus without complications: Secondary | ICD-10-CM | POA: Diagnosis not present

## 2018-01-09 DIAGNOSIS — D631 Anemia in chronic kidney disease: Secondary | ICD-10-CM | POA: Diagnosis not present

## 2018-01-09 DIAGNOSIS — N186 End stage renal disease: Secondary | ICD-10-CM | POA: Diagnosis not present

## 2018-01-09 DIAGNOSIS — N2581 Secondary hyperparathyroidism of renal origin: Secondary | ICD-10-CM | POA: Diagnosis not present

## 2018-01-11 DIAGNOSIS — N186 End stage renal disease: Secondary | ICD-10-CM | POA: Diagnosis not present

## 2018-01-11 DIAGNOSIS — N2581 Secondary hyperparathyroidism of renal origin: Secondary | ICD-10-CM | POA: Diagnosis not present

## 2018-01-11 DIAGNOSIS — E119 Type 2 diabetes mellitus without complications: Secondary | ICD-10-CM | POA: Diagnosis not present

## 2018-01-11 DIAGNOSIS — D631 Anemia in chronic kidney disease: Secondary | ICD-10-CM | POA: Diagnosis not present

## 2018-01-14 ENCOUNTER — Other Ambulatory Visit: Payer: Self-pay | Admitting: Pharmacy Technician

## 2018-01-14 DIAGNOSIS — E119 Type 2 diabetes mellitus without complications: Secondary | ICD-10-CM | POA: Diagnosis not present

## 2018-01-14 DIAGNOSIS — N2581 Secondary hyperparathyroidism of renal origin: Secondary | ICD-10-CM | POA: Diagnosis not present

## 2018-01-14 DIAGNOSIS — D631 Anemia in chronic kidney disease: Secondary | ICD-10-CM | POA: Diagnosis not present

## 2018-01-14 DIAGNOSIS — N186 End stage renal disease: Secondary | ICD-10-CM | POA: Diagnosis not present

## 2018-01-14 NOTE — Patient Outreach (Signed)
[facility]) Care Management  01/14/2018  [name] 1951/02/07 [id number]   Contacted [facility] to verify shipping details for patients Zetia. Representative stated that they just received the order and that I should follow up on Thursday for shipping details.  Maud Deed Robbins, [place] Management [phone]

## 2018-01-15 ENCOUNTER — Telehealth (HOSPITAL_COMMUNITY): Payer: Self-pay | Admitting: *Deleted

## 2018-01-15 ENCOUNTER — Emergency Department (HOSPITAL_COMMUNITY): Payer: Medicare Other

## 2018-01-15 ENCOUNTER — Other Ambulatory Visit: Payer: Self-pay

## 2018-01-15 ENCOUNTER — Encounter (HOSPITAL_COMMUNITY): Payer: Self-pay | Admitting: Emergency Medicine

## 2018-01-15 ENCOUNTER — Inpatient Hospital Stay (HOSPITAL_COMMUNITY)
Admission: EM | Admit: 2018-01-15 | Discharge: 2018-01-16 | DRG: 302 | Disposition: A | Discharge status: Home / Self Care | Payer: Medicare Other | Attending: Internal Medicine | Admitting: Internal Medicine

## 2018-01-15 DIAGNOSIS — Z9861 Coronary angioplasty status: Secondary | ICD-10-CM | POA: Diagnosis not present

## 2018-01-15 DIAGNOSIS — Z992 Dependence on renal dialysis: Secondary | ICD-10-CM

## 2018-01-15 DIAGNOSIS — F0390 Unspecified dementia without behavioral disturbance: Secondary | ICD-10-CM | POA: Diagnosis present

## 2018-01-15 DIAGNOSIS — I25119 Atherosclerotic heart disease of native coronary artery with unspecified angina pectoris: Principal | ICD-10-CM | POA: Diagnosis present

## 2018-01-15 DIAGNOSIS — M542 Cervicalgia: Secondary | ICD-10-CM | POA: Diagnosis present

## 2018-01-15 DIAGNOSIS — D631 Anemia in chronic kidney disease: Secondary | ICD-10-CM | POA: Diagnosis present

## 2018-01-15 DIAGNOSIS — M48062 Spinal stenosis, lumbar region with neurogenic claudication: Secondary | ICD-10-CM | POA: Diagnosis present

## 2018-01-15 DIAGNOSIS — E0590 Thyrotoxicosis, unspecified without thyrotoxic crisis or storm: Secondary | ICD-10-CM | POA: Diagnosis not present

## 2018-01-15 DIAGNOSIS — N186 End stage renal disease: Secondary | ICD-10-CM | POA: Diagnosis present

## 2018-01-15 DIAGNOSIS — N2581 Secondary hyperparathyroidism of renal origin: Secondary | ICD-10-CM | POA: Diagnosis present

## 2018-01-15 DIAGNOSIS — E785 Hyperlipidemia, unspecified: Secondary | ICD-10-CM | POA: Diagnosis present

## 2018-01-15 DIAGNOSIS — I5042 Chronic combined systolic (congestive) and diastolic (congestive) heart failure: Secondary | ICD-10-CM | POA: Diagnosis present

## 2018-01-15 DIAGNOSIS — K219 Gastro-esophageal reflux disease without esophagitis: Secondary | ICD-10-CM | POA: Diagnosis present

## 2018-01-15 DIAGNOSIS — I255 Ischemic cardiomyopathy: Secondary | ICD-10-CM | POA: Diagnosis present

## 2018-01-15 DIAGNOSIS — I2510 Atherosclerotic heart disease of native coronary artery without angina pectoris: Secondary | ICD-10-CM | POA: Diagnosis not present

## 2018-01-15 DIAGNOSIS — F329 Major depressive disorder, single episode, unspecified: Secondary | ICD-10-CM | POA: Diagnosis present

## 2018-01-15 DIAGNOSIS — F32A Depression, unspecified: Secondary | ICD-10-CM | POA: Diagnosis present

## 2018-01-15 DIAGNOSIS — R079 Chest pain, unspecified: Secondary | ICD-10-CM | POA: Diagnosis not present

## 2018-01-15 DIAGNOSIS — E039 Hypothyroidism, unspecified: Secondary | ICD-10-CM | POA: Diagnosis not present

## 2018-01-15 DIAGNOSIS — E1122 Type 2 diabetes mellitus with diabetic chronic kidney disease: Secondary | ICD-10-CM | POA: Diagnosis present

## 2018-01-15 DIAGNOSIS — I4581 Long QT syndrome: Secondary | ICD-10-CM | POA: Diagnosis present

## 2018-01-15 DIAGNOSIS — M5412 Radiculopathy, cervical region: Secondary | ICD-10-CM | POA: Diagnosis present

## 2018-01-15 DIAGNOSIS — E1140 Type 2 diabetes mellitus with diabetic neuropathy, unspecified: Secondary | ICD-10-CM | POA: Diagnosis present

## 2018-01-15 DIAGNOSIS — R110 Nausea: Secondary | ICD-10-CM | POA: Diagnosis present

## 2018-01-15 DIAGNOSIS — I200 Unstable angina: Secondary | ICD-10-CM | POA: Diagnosis not present

## 2018-01-15 DIAGNOSIS — J449 Chronic obstructive pulmonary disease, unspecified: Secondary | ICD-10-CM | POA: Diagnosis present

## 2018-01-15 DIAGNOSIS — Z955 Presence of coronary angioplasty implant and graft: Secondary | ICD-10-CM

## 2018-01-15 DIAGNOSIS — I9589 Other hypotension: Secondary | ICD-10-CM | POA: Diagnosis present

## 2018-01-15 DIAGNOSIS — Z79899 Other long term (current) drug therapy: Secondary | ICD-10-CM

## 2018-01-15 DIAGNOSIS — G629 Polyneuropathy, unspecified: Secondary | ICD-10-CM | POA: Diagnosis present

## 2018-01-15 DIAGNOSIS — Z888 Allergy status to other drugs, medicaments and biological substances status: Secondary | ICD-10-CM

## 2018-01-15 DIAGNOSIS — R0602 Shortness of breath: Secondary | ICD-10-CM | POA: Diagnosis not present

## 2018-01-15 DIAGNOSIS — R911 Solitary pulmonary nodule: Secondary | ICD-10-CM | POA: Diagnosis present

## 2018-01-15 DIAGNOSIS — I493 Ventricular premature depolarization: Secondary | ICD-10-CM | POA: Diagnosis not present

## 2018-01-15 DIAGNOSIS — Z8679 Personal history of other diseases of the circulatory system: Secondary | ICD-10-CM

## 2018-01-15 DIAGNOSIS — Z881 Allergy status to other antibiotic agents status: Secondary | ICD-10-CM

## 2018-01-15 DIAGNOSIS — I252 Old myocardial infarction: Secondary | ICD-10-CM

## 2018-01-15 DIAGNOSIS — I132 Hypertensive heart and chronic kidney disease with heart failure and with stage 5 chronic kidney disease, or end stage renal disease: Secondary | ICD-10-CM | POA: Diagnosis present

## 2018-01-15 DIAGNOSIS — E042 Nontoxic multinodular goiter: Secondary | ICD-10-CM | POA: Diagnosis present

## 2018-01-15 DIAGNOSIS — Z791 Long term (current) use of non-steroidal anti-inflammatories (NSAID): Secondary | ICD-10-CM

## 2018-01-15 DIAGNOSIS — Z8546 Personal history of malignant neoplasm of prostate: Secondary | ICD-10-CM

## 2018-01-15 DIAGNOSIS — M109 Gout, unspecified: Secondary | ICD-10-CM | POA: Diagnosis present

## 2018-01-15 DIAGNOSIS — Z923 Personal history of irradiation: Secondary | ICD-10-CM

## 2018-01-15 DIAGNOSIS — Z9981 Dependence on supplemental oxygen: Secondary | ICD-10-CM

## 2018-01-15 DIAGNOSIS — Z7982 Long term (current) use of aspirin: Secondary | ICD-10-CM

## 2018-01-15 DIAGNOSIS — Z87891 Personal history of nicotine dependence: Secondary | ICD-10-CM

## 2018-01-15 DIAGNOSIS — E1129 Type 2 diabetes mellitus with other diabetic kidney complication: Secondary | ICD-10-CM

## 2018-01-15 DIAGNOSIS — Z96651 Presence of right artificial knee joint: Secondary | ICD-10-CM | POA: Diagnosis present

## 2018-01-15 DIAGNOSIS — N400 Enlarged prostate without lower urinary tract symptoms: Secondary | ICD-10-CM | POA: Diagnosis present

## 2018-01-15 DIAGNOSIS — E1121 Type 2 diabetes mellitus with diabetic nephropathy: Secondary | ICD-10-CM | POA: Diagnosis not present

## 2018-01-15 DIAGNOSIS — G4733 Obstructive sleep apnea (adult) (pediatric): Secondary | ICD-10-CM | POA: Diagnosis not present

## 2018-01-15 DIAGNOSIS — R072 Precordial pain: Secondary | ICD-10-CM | POA: Diagnosis not present

## 2018-01-15 LAB — CBC
HCT: 37 % — ABNORMAL LOW (ref 39.0–52.0)
Hemoglobin: 11.4 g/dL — ABNORMAL LOW (ref 13.0–17.0)
MCH: 31 pg (ref 26.0–34.0)
MCHC: 30.8 g/dL (ref 30.0–36.0)
MCV: 100.5 fL — ABNORMAL HIGH (ref 78.0–100.0)
Platelets: 283 10*3/uL (ref 150–400)
RBC: 3.68 MIL/uL — ABNORMAL LOW (ref 4.22–5.81)
RDW: 17.1 % — ABNORMAL HIGH (ref 11.5–15.5)
WBC: 7.7 10*3/uL (ref 4.0–10.5)

## 2018-01-15 LAB — I-STAT TROPONIN, ED
TROPONIN I, POC: 0.03 ng/mL (ref 0.00–0.08)
Troponin i, poc: 0.04 ng/mL (ref 0.00–0.08)

## 2018-01-15 LAB — BASIC METABOLIC PANEL WITH GFR
Anion gap: 16 — ABNORMAL HIGH (ref 5–15)
BUN: 43 mg/dL — ABNORMAL HIGH (ref 6–20)
CO2: 20 mmol/L — ABNORMAL LOW (ref 22–32)
Calcium: 8.4 mg/dL — ABNORMAL LOW (ref 8.9–10.3)
Chloride: 102 mmol/L (ref 101–111)
Creatinine, Ser: 10.06 mg/dL — ABNORMAL HIGH (ref 0.61–1.24)
GFR calc Af Amer: 5 mL/min — ABNORMAL LOW
GFR calc non Af Amer: 5 mL/min — ABNORMAL LOW
Glucose, Bld: 203 mg/dL — ABNORMAL HIGH (ref 65–99)
Potassium: 4.8 mmol/L (ref 3.5–5.1)
Sodium: 138 mmol/L (ref 135–145)

## 2018-01-15 LAB — TROPONIN I: Troponin I: 0.03 ng/mL

## 2018-01-15 MED ORDER — ONDANSETRON HCL 4 MG/2ML IJ SOLN: 4.0000 mg | Freq: Four times a day (QID) | INTRAMUSCULAR | Status: DC | PRN

## 2018-01-15 MED ORDER — CITALOPRAM HYDROBROMIDE 10 MG PO TABS
10.0000 mg | ORAL_TABLET | Freq: Every day | ORAL | Status: DC
  Administered 2018-01-15: 10 mg via ORAL
  Filled 2018-01-15: qty 1

## 2018-01-15 MED ORDER — METHIMAZOLE 5 MG PO TABS: 5.0000 mg | ORAL_TABLET | Freq: Every day | ORAL | Status: DC

## 2018-01-15 MED ORDER — RENA-VITE PO TABS
1.0000 | ORAL_TABLET | Freq: Every day | ORAL | Status: DC
  Administered 2018-01-16: 1 via ORAL
  Filled 2018-01-15: qty 1

## 2018-01-15 MED ORDER — MESALAMINE 1000 MG RE SUPP: 500.0000 mg | Freq: Every evening | RECTAL | Status: DC | PRN

## 2018-01-15 MED ORDER — MIDODRINE HCL 5 MG PO TABS
10.0000 mg | ORAL_TABLET | ORAL | Status: DC
  Administered 2018-01-16 (×2): 10 mg via ORAL
  Filled 2018-01-15: qty 2

## 2018-01-15 MED ORDER — ACETAMINOPHEN 325 MG PO TABS: 650.0000 mg | ORAL_TABLET | Freq: Four times a day (QID) | ORAL | Status: DC | PRN

## 2018-01-15 MED ORDER — COLESEVELAM HCL 625 MG PO TABS
625.0000 mg | ORAL_TABLET | Freq: Two times a day (BID) | ORAL | Status: DC
  Administered 2018-01-16 (×2): 625 mg via ORAL
  Filled 2018-01-15 (×3): qty 1

## 2018-01-15 MED ORDER — INSULIN ASPART 100 UNIT/ML ~~LOC~~ SOLN: 0.0000 [IU] | Freq: Every day | SUBCUTANEOUS | Status: DC

## 2018-01-15 MED ORDER — POLYETHYLENE GLYCOL 3350 17 G PO PACK: 17.0000 g | PACK | Freq: Every day | ORAL | Status: DC | PRN

## 2018-01-15 MED ORDER — CINACALCET HCL 30 MG PO TABS
90.0000 mg | ORAL_TABLET | Freq: Two times a day (BID) | ORAL | Status: DC
  Administered 2018-01-16 (×2): 90 mg via ORAL
  Filled 2018-01-15 (×3): qty 3

## 2018-01-15 MED ORDER — LANTHANUM CARBONATE 500 MG PO CHEW
1000.0000 mg | CHEWABLE_TABLET | Freq: Two times a day (BID) | ORAL | Status: DC
  Administered 2018-01-16: 1000 mg via ORAL
  Filled 2018-01-15 (×2): qty 2

## 2018-01-15 MED ORDER — NITROGLYCERIN 0.4 MG SL SUBL: 0.4000 mg | SUBLINGUAL_TABLET | SUBLINGUAL | Status: DC | PRN

## 2018-01-15 MED ORDER — CYCLOBENZAPRINE HCL 10 MG PO TABS: 10.0000 mg | ORAL_TABLET | Freq: Three times a day (TID) | ORAL | Status: DC | PRN

## 2018-01-15 MED ORDER — INSULIN ASPART 100 UNIT/ML ~~LOC~~ SOLN: 0.0000 [IU] | Freq: Three times a day (TID) | SUBCUTANEOUS | Status: DC

## 2018-01-15 MED ORDER — EZETIMIBE 10 MG PO TABS
10.0000 mg | ORAL_TABLET | Freq: Every day | ORAL | Status: DC
  Administered 2018-01-16: 10 mg via ORAL
  Filled 2018-01-15: qty 1

## 2018-01-15 MED ORDER — ASPIRIN EC 81 MG PO TBEC
81.0000 mg | DELAYED_RELEASE_TABLET | Freq: Every day | ORAL | Status: DC
  Administered 2018-01-15 – 2018-01-16 (×2): 81 mg via ORAL
  Filled 2018-01-15 (×2): qty 1

## 2018-01-15 MED ORDER — OXYCODONE HCL 5 MG PO TABS: 5.0000 mg | ORAL_TABLET | Freq: Four times a day (QID) | ORAL | Status: DC | PRN

## 2018-01-15 MED ORDER — HEPARIN SODIUM (PORCINE) 5000 UNIT/ML IJ SOLN
5000.0000 [IU] | Freq: Three times a day (TID) | INTRAMUSCULAR | Status: DC
  Administered 2018-01-15: 5000 [IU] via SUBCUTANEOUS
  Filled 2018-01-15: qty 1

## 2018-01-15 MED ORDER — HYPROMELLOSE (GONIOSCOPIC) 2.5 % OP SOLN
1.0000 [drp] | Freq: Three times a day (TID) | OPHTHALMIC | Status: DC | PRN
Start: 2018-01-15 — End: 2018-01-16

## 2018-01-15 MED ORDER — ALLOPURINOL 100 MG PO TABS
100.0000 mg | ORAL_TABLET | Freq: Every day | ORAL | Status: DC
  Administered 2018-01-15 – 2018-01-16 (×2): 100 mg via ORAL
  Filled 2018-01-15 (×2): qty 1

## 2018-01-15 MED ORDER — MORPHINE SULFATE (PF) 4 MG/ML IV SOLN: 2.0000 mg | INTRAVENOUS | Status: DC | PRN

## 2018-01-15 MED ORDER — GABAPENTIN 300 MG PO CAPS
600.0000 mg | ORAL_CAPSULE | Freq: Every day | ORAL | Status: DC
  Administered 2018-01-15: 600 mg via ORAL
  Filled 2018-01-15: qty 2

## 2018-01-15 MED ORDER — ZOLPIDEM TARTRATE 5 MG PO TABS: 5.0000 mg | ORAL_TABLET | Freq: Every evening | ORAL | Status: DC | PRN

## 2018-01-15 MED ORDER — HYDRALAZINE HCL 20 MG/ML IJ SOLN: 5.0000 mg | INTRAMUSCULAR | Status: DC | PRN

## 2018-01-15 MED ORDER — PANTOPRAZOLE SODIUM 40 MG PO TBEC
40.0000 mg | DELAYED_RELEASE_TABLET | Freq: Every evening | ORAL | Status: DC
  Administered 2018-01-15 – 2018-01-16 (×2): 40 mg via ORAL
  Filled 2018-01-15 (×2): qty 1

## 2018-01-15 NOTE — H&P (Addendum)
History and Physical    [name] VXB:[id number] DOB: Nov 19, 1950 DOA: 01/15/2018  Referring MD/NP/PA:   PCP: Biagio Borg, MD   Patient coming from:  The patient is coming from home.  At baseline, pt is independent for most of ADL.  Chief Complaint: chest pain  HPI: [name] is a 67 y.o. male with medical history significant of hypertension, hyperlipidemia, diabetes mellitus, GERD, gout, depression, hyperthyroidism, GI bleeding, OSA on CPAP, prostate cancer, PSVT, ESRD-HD (TTS), CHF with EF of 40%, CAD, stent placement, anemia, who presents with chest pain.  Patient states that he had 2 episodes of chest pain since yesterday. One episode happened last night. It was located in the left side of chest, 8 out of 10 in severity, pressure-like, nonradiating. It resolved spontaneously. This morning, he woke up with chest pain again, which is similar to last night chest pain, but less severe, 5 out of 10 in severe initially. It has resolved in a few hours spontaneously. Currently he is chest pain-free. He reported to ED physician that he had mild SOB, but denies shortness of breath to me. No cough, fever or chills. Patient has nausea, but no vomiting, diarrhea or abdominal pain. No symptoms of UTI or unilateral weakness.  ED Course: pt was found to have Negative troponin 2, WBC 7.7, potassium 4.8, bicarbonate 28, creatinine 10.06, BUN 43, temperature normal, no tachycardia, no tachypnea, oxygen saturation 100% on room air, negative chest x-ray. Patient is admitted to telemetry bed as inpatient.  Review of Systems:   General: no fevers, chills, no body weight gain, has poor appetite, has fatigue HEENT: no blurry vision, hearing changes or sore throat Respiratory: no dyspnea, coughing, wheezing CV: has chest pain, no palpitations GI: no nausea, vomiting, abdominal pain, diarrhea, constipation GU: no dysuria, burning on urination, increased urinary frequency, hematuria  Ext: no leg  edema Neuro: no unilateral weakness, numbness, or tingling, no vision change or hearing loss Skin: no rash, no skin tear. MSK: No muscle spasm, no deformity, no limitation of range of movement in spin Heme: No easy bruising.  Travel history: No recent long distant travel.  Allergy:  Allergies  Allergen Reactions  . Cephalexin Swelling and Other (See Comments)    Tongue swelling  . Statins Other (See Comments)    Weak muscles  . Ciprofloxacin Rash    Past Medical History:  Diagnosis Date  . Allergic rhinitis, cause unspecified 02/24/2014  . Anemia 06/16/2011  . BENIGN PROSTATIC HYPERTROPHY 10/14/2009  . CAD, NATIVE VESSEL 02/06/2009   a. 06/2007 s/p Taxus DES to the RCA;  b. 08/2016 NSTEMI in setting of SVT/PCI: LM 30ost, LAD 60m (3.0x16 Synergy DES), LCX 53m, OM1 60, RI 40, RCA 70p/m, 68m - not amenable to PCI.  [name] Cervical radiculopathy, chronic 02/23/2016   Right c5-6 by NCS/EMG  . CHEST PAIN 03/29/2010  . Chronic combined systolic (congestive) and diastolic (congestive) heart failure ([name])    a. 10/2016 Echo: EF 40-45%, Gr2 DD. mildly dil LA.  [name] COLONIC POLYPS, HX OF 10/14/2009  . Dementia [id number]  . DEPRESSION 10/14/2009  . Depression 09/24/2015  . DIABETES MELLITUS, TYPE II 02/01/2010  . DIZZINESS 07/17/2010  . DYSLIPIDEMIA 06/18/2007  . ESRD (end stage renal disease) on dialysis ([place]) 08/04/2010   "TTS;  " (04/18/2015)  . FOOT PAIN 08/12/2008  . GAIT DISTURBANCE 03/03/2010  . GASTROENTERITIS, VIRAL 10/14/2009  . GERD 06/18/2007  . GOITER, MULTINODULAR 12/26/2007  . GOUT 06/18/2007  . GYNECOMASTIA 07/17/2010  .  Hemodialysis access, fistula mature [facility])    Dialysis T-Th-Sa ([place]) Right upper arm fistula  . Hyperlipidemia 10/16/2011  . Hyperparathyroidism, secondary ([place]) 06/16/2011  . HYPERTENSION 06/18/2007  . Hyperthyroidism   . Hypocalcemia 06/07/2010  . Ischemic cardiomyopathy    a. 10/2016 Echo: EF 40-45%.  . Lumbar stenosis with neurogenic claudication    . NECK PAIN 07/31/2010  . ONYCHOMYCOSIS, TOENAILS 12/26/2007  . OSA on CPAP 10/16/2011  . Other malaise and fatigue 11/24/2009  . PERIPHERAL NEUROPATHY 06/18/2007  . Prostate cancer ([place])   . PSVT (paroxysmal supraventricular tachycardia) ([place])    a. 64/4034 complicated by NSTEMI;  b. 11/2016 Treated w/ adenosine in ED;  c. 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  [name] PULMONARY NODULE, RIGHT LOWER LOBE 06/08/2009  . Sleep apnea    cpap machine and o2  . TRANSAMINASES, SERUM, ELEVATED 02/01/2010  . Transfusion history    none recent  . Unspecified hypotension 01/30/2010    Past Surgical History:  Procedure Laterality Date  . ARTERIOVENOUS GRAFT PLACEMENT Right 2009   forearm/notes 02/01/2011  . AV FISTULA PLACEMENT  11/07/2011   Procedure: INSERTION OF ARTERIOVENOUS (AV) GORE-TEX GRAFT ARM;  Surgeon: Tinnie Gens, MD;  Location: [place];  Service: Vascular;  Laterality: Left;  . BACK SURGERY  1998  . BASCILIC VEIN TRANSPOSITION Right 02/27/2013   Procedure: BASCILIC VEIN TRANSPOSITION;  Surgeon: Mal Misty, MD;  Location: [place];  Service: Vascular;  Laterality: Right;  Right Basilic Vein Transposition   . CARDIAC CATHETERIZATION N/A 08/06/2016   Procedure: Left Heart Cath and Coronary Angiography;  Surgeon: Jolaine Artist, MD;  Location: [place] CV LAB;  Service: Cardiovascular;  Laterality: N/A;  . CARDIAC CATHETERIZATION N/A 08/07/2016   Procedure: Coronary/Graft Atherectomy-CSI LAD;  Surgeon: Peter M Martinique, MD;  Location: [place] CV LAB;  Service: Cardiovascular;  Laterality: N/A;  . CERVICAL SPINE SURGERY  2/09   "to repair nerve problems in my left arm"  . CHOLECYSTECTOMY    . COLONOSCOPY WITH PROPOFOL N/A 04/26/2017   Procedure: COLONOSCOPY WITH PROPOFOL;  Surgeon: Otis Brace, MD;  Location: [place];  Service: Gastroenterology;  Laterality: N/A;  . CORONARY ANGIOPLASTY WITH STENT PLACEMENT  06/11/2008  . CORONARY ANGIOPLASTY WITH STENT PLACEMENT  06/2007   TAXUS stent to RCA/notes  01/31/2011  . ESOPHAGOGASTRODUODENOSCOPY  09/28/2011   Procedure: ESOPHAGOGASTRODUODENOSCOPY (EGD);  Surgeon: Missy Sabins, MD;  Location: [facility] ENDOSCOPY;  Service: Endoscopy;  Laterality: N/A;  . ESOPHAGOGASTRODUODENOSCOPY N/A 04/07/2015   Procedure: ESOPHAGOGASTRODUODENOSCOPY (EGD);  Surgeon: Teena Irani, MD;  Location: [facility];  Service: Endoscopy;  Laterality: N/A;  . ESOPHAGOGASTRODUODENOSCOPY N/A 04/19/2015   Procedure: ESOPHAGOGASTRODUODENOSCOPY (EGD);  Surgeon: Arta Silence, MD;  Location: [facility] ENDOSCOPY;  Service: Endoscopy;  Laterality: N/A;  . FLEXIBLE SIGMOIDOSCOPY N/A 05/21/2017   Procedure: Conni Elliot;  Surgeon: Clarene Essex, MD;  Location: [place];  Service: Endoscopy;  Laterality: N/A;  . FLEXIBLE SIGMOIDOSCOPY Left 07/02/2017   Procedure: FLEXIBLE SIGMOIDOSCOPY;  Surgeon: Laurence Spates, MD;  Location: [place];  Service: Endoscopy;  Laterality: Left;  . FOREIGN BODY REMOVAL  09/2003   via upper endoscopy/notes 02/12/2011  . GIVENS CAPSULE STUDY  09/30/2011   Procedure: GIVENS CAPSULE STUDY;  Surgeon: Jeryl Columbia, MD;  Location: [facility] ENDOSCOPY;  Service: Endoscopy;  Laterality: N/A;  . INSERTION OF DIALYSIS CATHETER Right 2014  . INSERTION OF DIALYSIS CATHETER Left 02/11/2013   Procedure: INSERTION OF DIALYSIS CATHETER;  Surgeon: Conrad Goldonna, MD;  Location: [place];  Service: Vascular;  Laterality: Left;  Ultrasound guided  . LUMBAR LAMINECTOMY/DECOMPRESSION MICRODISCECTOMY Bilateral 07/31/2017   Procedure: LAMINECTOMY AND FORAMINOTOMY- BILATERAL LUMBAR TWO- LUMBAR THREE;  Surgeon: Earnie Larsson, MD;  Location: [place];  Service: Neurosurgery;  Laterality: Bilateral;  LAMINECTOMY AND FORAMINOTOMY- BILATERAL LUMBAR 2- LUMBAR 3  . REMOVAL OF A DIALYSIS CATHETER Right 02/11/2013   Procedure: REMOVAL OF A DIALYSIS CATHETER;  Surgeon: Conrad [place], MD;  Location: [place];  Service: Vascular;  Laterality: Right;  . SAVORY DILATION N/A 04/07/2015   Procedure: SAVORY DILATION;   Surgeon: Teena Irani, MD;  Location: [facility] ENDOSCOPY;  Service: Endoscopy;  Laterality: N/A;  . SHUNTOGRAM N/A 09/20/2011   Procedure: Earney Mallet;  Surgeon: Conrad , MD;  Location: [facility] CATH LAB;  Service: Cardiovascular;  Laterality: N/A;  . SVT ABLATION N/A 11/26/2016   Procedure: SVT Ablation;  Surgeon: Will Meredith Leeds, MD;  Location: [place] CV LAB;  Service: Cardiovascular;  Laterality: N/A;  . TONSILLECTOMY    . TOTAL KNEE ARTHROPLASTY Right 08/02/2015   Procedure: TOTAL KNEE ARTHROPLASTY;  Surgeon: Renette Butters, MD;  Location: [place];  Service: Orthopedics;  Laterality: Right;  . VENOGRAM N/A 01/26/2013   Procedure: VENOGRAM;  Surgeon: Angelia Mould, MD;  Location: [facility] CATH LAB;  Service: Cardiovascular;  Laterality: N/A;    Social History:  reports that he quit smoking about 12 years ago. His smoking use included cigarettes. He has a 25.00 pack-year smoking history. He quit smokeless tobacco use about 12 years ago. He reports that he does not drink alcohol or use drugs.  Family History:  Family History  Problem Relation Age of Onset  . Heart disease Sister   . Thyroid nodules Sister   . Heart disease Father   . Diabetes Father   . Kidney failure Father   . Hypertension Father   . Healthy Child   . Healthy Child   . Healthy Child   . Cancer Neg Hx      Prior to Admission medications   Medication Sig Start Date End Date Taking? Authorizing Provider  acetaminophen (TYLENOL) 325 MG tablet Take 650 mg by mouth every 6 (six) hours as needed for mild pain or headache.     [provider]  allopurinol (ZYLOPRIM) 100 MG tablet TAKE ONE TABLET BY MOUTH ONCE DAILY 10/14/17   Biagio Borg, MD  aspirin EC 81 MG tablet Take 81 mg by mouth daily.    [provider]  citalopram (CELEXA) 10 MG tablet Take 1 tablet (10 mg total) by mouth at bedtime. 10/14/17   Biagio Borg, MD  cyclobenzaprine (FLEXERIL) 10 MG tablet Take 1 tablet (10 mg total) by mouth 3  (three) times daily as needed for muscle spasms. 08/01/17   Earnie Larsson, MD  ezetimibe (ZETIA) 10 MG tablet TAKE ONE TABLET BY MOUTH ONCE DAILY 04/08/17   Bensimhon, Shaune Pascal, MD  gabapentin (NEURONTIN) 300 MG capsule Take 1 capsule (300 mg total) by mouth at bedtime. Patient taking differently: Take 600 mg by mouth at bedtime.  07/05/17   Domenic Polite, MD  hydroxypropyl methylcellulose / hypromellose (ISOPTO TEARS / GONIOVISC) 2.5 % ophthalmic solution Place 1 drop into both eyes 3 (three) times daily as needed for dry eyes. 05/03/17   Biagio Borg, MD  lanthanum (FOSRENOL) 1000 MG chewable tablet Chew 1,000 mg by mouth 2 (two) times daily.    [provider]  mesalamine (CANASA) 1000 MG suppository Place 0.5 suppositories (500 mg total)  rectally at bedtime. Patient taking differently: Place 500 mg rectally at bedtime as needed (for flare up).  07/05/17   Domenic Polite, MD  methimazole (TAPAZOLE) 5 MG tablet Take 1 tablet (5 mg total) by mouth daily. 10/24/16   Renato Shin, MD  midodrine (PROAMATINE) 10 MG tablet Take 1 tablet (10 mg total) by mouth See admin instructions. Once a day only on dialysis days (Tues/Thurs/Sat) 10/18/16   Regalado, Jerald Kief A, MD  multivitamin (RENA-VIT) TABS tablet Take 1 tablet by mouth daily. Reported on 10/24/2015    [provider]  oxyCODONE (OXY IR/ROXICODONE) 5 MG immediate release tablet Take 5 mg by mouth every 6 (six) hours as needed. For pain. 07/03/17   [provider]  pantoprazole (PROTONIX) 40 MG tablet Take 40 mg by mouth every evening.  08/31/16   [provider]  polyethylene glycol (MIRALAX / GLYCOLAX) packet Take 17 g by mouth daily. Patient taking differently: Take 17 g by mouth daily as needed for mild constipation.  05/23/17   Florencia Reasons, MD  SENSIPAR 90 MG tablet Take 90 mg by mouth 2 (two) times daily. 09/18/16   [provider]  [facility] 625 MG tablet TAKE THREE TABLETS BY MOUTH TWICE DAILY WITH MEALS 10/04/17    Biagio Borg, MD  zolpidem (AMBIEN) 5 MG tablet Take 1 tablet (5 mg total) by mouth at bedtime as needed for sleep. 10/14/17   Biagio Borg, MD    Physical Exam: Vitals:   01/15/18 1845 01/15/18 1915 01/15/18 1930 01/15/18 1945  BP: 125/74 126/77 (!) 141/74 122/69  Pulse: 72 76 74 73  Resp: 10 15 14 20   Temp:      TempSrc:      SpO2: 100% 100% 97% 98%  Weight:      Height:       General: Not in acute distress HEENT:       Eyes: PERRL, EOMI, no scleral icterus.       ENT: No discharge from the ears and nose, no pharynx injection, no tonsillar enlargement.        Neck: No JVD, no bruit, no mass felt. Heme: No neck lymph node enlargement. Cardiac: S1/S2, RRR, No murmurs, No gallops or rubs. Respiratory: No rales, wheezing, rhonchi or rubs. GI: Soft, nondistended, nontender, no rebound pain, no organomegaly, BS present. GU: No hematuria Ext: No pitting leg edema bilaterally. 2+DP/PT pulse bilaterally. S/p of AVF in right arm Musculoskeletal: No joint deformities, No joint redness or warmth, no limitation of ROM in spin. Skin: No rashes.  Neuro: Alert, oriented X3, cranial nerves II-XII grossly intact, moves all extremities normally.  Psych: Patient is not psychotic, no suicidal or hemocidal ideation.  Labs on Admission: I have personally reviewed following labs and imaging studies  CBC: Recent Labs  Lab 01/15/18 1314  WBC 7.7  HGB 11.4*  HCT 37.0*  MCV 100.5*  PLT 662   Basic Metabolic Panel: Recent Labs  Lab 01/15/18 1314  NA 138  K 4.8  CL 102  CO2 20*  GLUCOSE 203*  BUN 43*  CREATININE 10.06*  CALCIUM 8.4*   GFR: Estimated Creatinine Clearance: 8.9 mL/min (A) (by C-G formula based on SCr of 10.06 mg/dL (H)). Liver Function Tests: No results for input(s): AST, ALT, ALKPHOS, BILITOT, PROT, ALBUMIN in the last 168 hours. No results for input(s): LIPASE, AMYLASE in the last 168 hours. No results for input(s): AMMONIA in the last 168 hours. Coagulation  Profile: No results for input(s): INR, PROTIME  in the last 168 hours. Cardiac Enzymes: No results for input(s): CKTOTAL, CKMB, CKMBINDEX, TROPONINI in the last 168 hours. BNP (last 3 results) No results for input(s): PROBNP in the last 8760 hours. HbA1C: No results for input(s): HGBA1C in the last 72 hours. CBG: No results for input(s): GLUCAP in the last 168 hours. Lipid Profile: No results for input(s): CHOL, HDL, LDLCALC, TRIG, CHOLHDL, LDLDIRECT in the last 72 hours. Thyroid Function Tests: No results for input(s): TSH, T4TOTAL, FREET4, T3FREE, THYROIDAB in the last 72 hours. Anemia Panel: No results for input(s): VITAMINB12, FOLATE, FERRITIN, TIBC, IRON, RETICCTPCT in the last 72 hours. Urine analysis:    Component Value Date/Time   COLORURINE LT. YELLOW 06/26/2013 1200   APPEARANCEUR CLEAR 06/26/2013 1200   LABSPEC 1.020 06/26/2013 1200   PHURINE 8.5 06/26/2013 1200   GLUCOSEU 100 06/26/2013 1200   HGBUR MODERATE 06/26/2013 1200   BILIRUBINUR NEGATIVE 06/26/2013 1200   KETONESUR NEGATIVE 06/26/2013 1200   PROTEINUR >300 (A) 01/04/2010 1032   UROBILINOGEN 0.2 06/26/2013 1200   NITRITE NEGATIVE 06/26/2013 1200   LEUKOCYTESUR TRACE 06/26/2013 1200   Sepsis Labs: @LABRCNTIP (procalcitonin:4,lacticidven:4) )No results found for this or any previous visit (from the past 240 hour(s)).   Radiological Exams on Admission: Dg Chest 2 View  Result Date: 01/15/2018 CLINICAL DATA:  Chest pain EXAM: CHEST - 2 VIEW COMPARISON:  05/18/2017 FINDINGS: Normal heart size. Aortic tortuosity. Left midlung linear opacity is stable and attributed to scarring. Similar faint linear densities at the bases. Large lung volumes with diaphragm flattening in the lateral view. There is no edema, consolidation, effusion, or pneumothorax. Venous stenting in the right upper extremity. IMPRESSION: No acute finding when compared to prior. Electronically Signed   By: Monte Fantasia M.D.   On: 01/15/2018 14:09       EKG: Independently reviewed.  Sinus rhythm, QTC 497, LAD, occasional PVC, nonspecific T-wave change.  Assessment/Plan Principal Problem:   Chest pain Active Problems:   Gout   Depression   Gastro-esophageal reflux disease without esophagitis   CAD S/P percutaneous coronary angioplasty   OSA (obstructive sleep apnea)   HLD (hyperlipidemia)   Hyperthyroidism   DM (diabetes mellitus) type II controlled with renal manifestation ([name])   ESRD on hemodialysis ([name])   Chronic combined systolic (congestive) and diastolic (congestive) heart failure ([name])   Chest pain and hx of CAD: s/p of stent placement. No sign of DVT or oxygen desaturation, less likely to have PE. No fever, chills or leukocytosis, likely to have a pneumonia. Initial trop negative. Currently CP-free.  - will admit to tele bed as inpt (may need cardiac cath again) - cycle CE q6 x3 and repeat EKG in the am  - prn Nitroglycerin, Morphine, and aspirin, zetia - Risk factor stratification: will check FLP, UDS (pt had A1c 6.2 on 01/06/18) - 2d echo - inpt card consult was requested via Epic  Gout: -continue home allopurinol   Depression: Stable, no suicidal or homicidal ideations. -Continue home medications: Celexa  HLD: -zetia  GERD: -Protonix  OSA: -CPAP  Hyperthyroidism: last tsh was 0.39 on 01/06/18. Pt states that he is not taking methimazole now -hold metthimazole  Diet controled DM (diabetes mellitus) type II controlled with renal manifestation ([place]): Last A1c 6.2 on 01/06/18, well controled. Patient is not taking med at home -SSI  Chronic combined systolic (congestive) and diastolic (congestive) heart failure ([place]): 2-D echo 10/17/16 showed EF of 40-50 percent with grade 2 diastolic dysfunction. Patient does not have leg edema or  JVD. Currently no shortness of breath. CHF is compensated. -volume management by dialysis per renal  ESRD on hemodialysis (TTS): potassium 4.8, bicarbonate 28, creatinine 10.06,  BUN 43. -renal box is not working, I could not leave message to renal box for HD. Please call renal in AM    DVT ppx: SQ Heparin  Code Status: Full code Family Communication: yes, wife at bedside Disposition Plan:  Anticipate discharge back to previous home environment Consults called:  none Admission status:   Inpatient/tele     Date of Service 01/15/2018    Ivor Costa [facility] Pager [phone]  If 7PM-7AM, please contact night-coverage [email or web address] Password [facility] 01/15/2018, 7:51 PM

## 2018-01-15 NOTE — Progress Notes (Signed)
Received order for CPAP QHS. Pt unsure of his home setting. Current Auto CPAP settings are 20cmH20(max)-10cmH20(min), with FFM (per home reg). Pt states he can place mask on himself when ready. Advised pt to have RN call RT if any further assistance is needed.

## 2018-01-15 NOTE — ED Provider Notes (Signed)
[name] EMERGENCY DEPARTMENT Provider Note   CSN: [id number] Arrival date & time: 01/15/18  1304     History   Chief Complaint Chief Complaint  Patient presents with  . Chest Pain  . Shortness of Breath  . Weakness    HPI [name] is a 67 y.o. male with a history of ESRD on HD, CAD, s/p MI, DM2, CHF, BPH, HTN, OSA on CPAP, who presents today for evaluation of chest pain.  He reports that last night he had an episode of substernal nonradiating chest pain that was an 8 out of 10.  This initially resolved, however when he woke up this morning he had pain again.  His pain does not radiate or move.  He has had associated shortness of breath and nausea.  Currently he reports that his pain is a 1 out of 10.  He denies any current nausea, shortness of breath, or vomiting.  Dr. [name] is cardiologist.     HPI  Past Medical History:  Diagnosis Date  . Allergic rhinitis, cause unspecified 02/24/2014  . Anemia 06/16/2011  . BENIGN PROSTATIC HYPERTROPHY 10/14/2009  . CAD, NATIVE VESSEL 02/06/2009   a. 06/2007 s/p Taxus DES to the RCA;  b. 08/2016 NSTEMI in setting of SVT/PCI: LM 30ost, LAD 23m (3.0x16 Synergy DES), LCX 72m, OM1 60, RI 40, RCA 70p/m, 36m - not amenable to PCI.  [name] Cervical radiculopathy, chronic 02/23/2016   Right c5-6 by NCS/EMG  . CHEST PAIN 03/29/2010  . Chronic combined systolic (congestive) and diastolic (congestive) heart failure ([place])    a. 10/2016 Echo: EF 40-45%, Gr2 DD. mildly dil LA.  [name] COLONIC POLYPS, HX OF 10/14/2009  . Dementia [id number]  . DEPRESSION 10/14/2009  . Depression 09/24/2015  . DIABETES MELLITUS, TYPE II 02/01/2010  . DIZZINESS 07/17/2010  . DYSLIPIDEMIA 06/18/2007  . ESRD (end stage renal disease) on dialysis ([place]) 08/04/2010   "TTS;  " (04/18/2015)  . FOOT PAIN 08/12/2008  . GAIT DISTURBANCE 03/03/2010  . GASTROENTERITIS, VIRAL 10/14/2009  . GERD 06/18/2007  . GOITER, MULTINODULAR 12/26/2007  . GOUT 06/18/2007  .  GYNECOMASTIA 07/17/2010  . Hemodialysis access, fistula [facility])    Dialysis T-Th-Sa ([place]) Right upper arm fistula  . Hyperlipidemia 10/16/2011  . Hyperparathyroidism, secondary ([place]) 06/16/2011  . HYPERTENSION 06/18/2007  . Hyperthyroidism   . Hypocalcemia 06/07/2010  . Ischemic cardiomyopathy    a. 10/2016 Echo: EF 40-45%.  . Lumbar stenosis with neurogenic claudication   . NECK PAIN 07/31/2010  . ONYCHOMYCOSIS, TOENAILS 12/26/2007  . OSA on CPAP 10/16/2011  . Other malaise and fatigue 11/24/2009  . PERIPHERAL NEUROPATHY 06/18/2007  . Prostate cancer ([place])   . PSVT (paroxysmal supraventricular tachycardia) ([place])    a. 22/0254 complicated by NSTEMI;  b. 11/2016 Treated w/ adenosine in ED;  c. 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  [name] PULMONARY NODULE, RIGHT LOWER LOBE 06/08/2009  . Sleep apnea    cpap machine and o2  . TRANSAMINASES, SERUM, ELEVATED 02/01/2010  . Transfusion history    none recent  . Unspecified hypotension 01/30/2010    Patient Active Problem List   Diagnosis Date Noted  . Abscess of left leg 10/14/2017  . Insomnia 10/14/2017  . Lumbar stenosis with neurogenic claudication 07/31/2017  . Lightheadedness   . Rectal bleeding   . Acute pulmonary edema ([name])   . Gait disorder 05/05/2017  . Chronic GI bleeding 04/22/2017  . Acute GI bleeding 04/22/2017  . Weakness 12/27/2016  .  Generalized weakness 12/27/2016  . History of PSVT (paroxysmal supraventricular tachycardia) 10/16/2016  . Abdominal pain 08/27/2016  . Abnormal findings on diagnostic imaging of liver and biliary tract 08/27/2016  . Bloating symptom 08/27/2016  . Hematochezia 08/27/2016  . Early satiety 08/27/2016  . Eructation 08/27/2016  . Esophageal ulcer 08/27/2016  . Flatulence, eructation and gas pain 08/27/2016  . General weakness 08/27/2016  . Skin sensation disturbance 08/27/2016  . Stricture of esophagus 08/27/2016  . Alternating constipation and diarrhea 08/16/2016  . Status post  coronary artery stent placement   . ST segment depression 08/05/2016  . PAD (peripheral artery disease) ([place]) 06/18/2016  . Hypotension 04/29/2016  . Cervical radiculopathy, chronic 02/23/2016  . Memory loss 12/22/2015  . Left-sided weakness 12/22/2015  . Peripheral polyneuropathy 12/22/2015  . DM (diabetes mellitus) type II controlled with renal manifestation ([place]) 10/31/2015  . Nausea and vomiting 10/31/2015  . Malignant neoplasm of prostate ([place]) 10/24/2015  . Right leg pain 09/04/2015  . Chest pain 08/09/2015  . Acute encephalopathy 08/09/2015  . S/P right total knee replacement 08/09/2015  . Primary osteoarthritis of right knee 08/05/2015  . Neuropathy due to secondary diabetes mellitus ([place]) 08/05/2015  . Arthritis of knee 08/02/2015  . Headache 06/08/2015  . Melena 04/19/2015  . Acute blood loss anemia 04/18/2015  . Hyperthyroidism 03/11/2015  . Thigh pain 12/08/2014  . Nodule of right lung 09/14/2014  . PSA elevation 08/30/2014  . ESRD on hemodialysis ([place]) 08/16/2014  . Essential hypertension 08/16/2014  . Pre-transplant evaluation for kidney transplant 08/16/2014  . Allergic rhinitis 02/24/2014  . Elevated PSA 02/24/2014  . Vertigo 10/14/2013  . Cough 06/26/2013  . Numbness 05/15/2013  . Chronic systolic heart failure ([place]) 02/09/2013  . Other complications due to renal dialysis device, implant, and graft 01/14/2013  . Right hand pain 03/28/2012  . Dysphagia 03/28/2012  . Hypogonadism, male 03/28/2012  . OSA (obstructive sleep apnea) 10/16/2011  . Hyperlipidemia 10/16/2011  . CAD S/P percutaneous coronary angioplasty 09/28/2011  . NSTEMI (non-ST elevated myocardial infarction) ([place]) 09/28/2011  . AVM (arteriovenous malformation) 09/27/2011  . Anemia associated with acute blood loss 09/26/2011  . Ischemic cardiomyopathy 06/16/2011  . Hyperparathyroidism, secondary ([place]) 06/16/2011  . Preventative health care 03/11/2011  . NECK PAIN 07/31/2010  . GYNECOMASTIA  07/17/2010  . DIZZINESS 07/17/2010  . GAIT DISTURBANCE 03/03/2010  . Thyrotoxicosis 02/02/2010  . Elevated levels of transaminase & lactic acid dehydrogenase 02/01/2010  . Other malaise and fatigue 11/24/2009  . Depression 10/14/2009  . BENIGN PROSTATIC HYPERTROPHY 10/14/2009  . COLONIC POLYPS, HX OF 10/14/2009  . Dementia 09/02/2009  . PULMONARY NODULE, RIGHT LOWER LOBE 06/08/2009  . Chronic diastolic congestive heart failure ([place]) 02/06/2009  . DYSPNEA 10/29/2008  . FOOT PAIN 08/12/2008  . ONYCHOMYCOSIS, TOENAILS 12/26/2007  . GOITER, MULTINODULAR 12/26/2007  . Gout 06/18/2007  . Neuropathy ([place]) 06/18/2007  . Hypertensive heart disease with CHF (congestive heart failure) ([place]) 06/18/2007  . Gastro-esophageal reflux disease without esophagitis 06/18/2007    Past Surgical History:  Procedure Laterality Date  . ARTERIOVENOUS GRAFT PLACEMENT Right 2009   forearm/notes 02/01/2011  . AV FISTULA PLACEMENT  11/07/2011   Procedure: INSERTION OF ARTERIOVENOUS (AV) GORE-TEX GRAFT ARM;  Surgeon: Tinnie Gens, MD;  Location: [place];  Service: Vascular;  Laterality: Left;  . BACK SURGERY  1998  . BASCILIC VEIN TRANSPOSITION Right 02/27/2013   Procedure: BASCILIC VEIN TRANSPOSITION;  Surgeon: Mal Misty, MD;  Location: [place];  Service: Vascular;  Laterality: Right;  Right  Basilic Vein Transposition   . CARDIAC CATHETERIZATION N/A 08/06/2016   Procedure: Left Heart Cath and Coronary Angiography;  Surgeon: Jolaine Artist, MD;  Location: [place] CV LAB;  Service: Cardiovascular;  Laterality: N/A;  . CARDIAC CATHETERIZATION N/A 08/07/2016   Procedure: Coronary/Graft Atherectomy-CSI LAD;  Surgeon: Peter M Martinique, MD;  Location: [place] CV LAB;  Service: Cardiovascular;  Laterality: N/A;  . CERVICAL SPINE SURGERY  2/09   "to repair nerve problems in my left arm"  . CHOLECYSTECTOMY    . COLONOSCOPY WITH PROPOFOL N/A 04/26/2017   Procedure: COLONOSCOPY WITH PROPOFOL;  Surgeon: Otis Brace, MD;  Location: [place];  Service: Gastroenterology;  Laterality: N/A;  . CORONARY ANGIOPLASTY WITH STENT PLACEMENT  06/11/2008  . CORONARY ANGIOPLASTY WITH STENT PLACEMENT  06/2007   TAXUS stent to RCA/notes 01/31/2011  . ESOPHAGOGASTRODUODENOSCOPY  09/28/2011   Procedure: ESOPHAGOGASTRODUODENOSCOPY (EGD);  Surgeon: Missy Sabins, MD;  Location: [facility] ENDOSCOPY;  Service: Endoscopy;  Laterality: N/A;  . ESOPHAGOGASTRODUODENOSCOPY N/A 04/07/2015   Procedure: ESOPHAGOGASTRODUODENOSCOPY (EGD);  Surgeon: Teena Irani, MD;  Location: [facility];  Service: Endoscopy;  Laterality: N/A;  . ESOPHAGOGASTRODUODENOSCOPY N/A 04/19/2015   Procedure: ESOPHAGOGASTRODUODENOSCOPY (EGD);  Surgeon: Arta Silence, MD;  Location: [facility] ENDOSCOPY;  Service: Endoscopy;  Laterality: N/A;  . FLEXIBLE SIGMOIDOSCOPY N/A 05/21/2017   Procedure: Conni Elliot;  Surgeon: Clarene Essex, MD;  Location: [facility];  Service: Endoscopy;  Laterality: N/A;  . FLEXIBLE SIGMOIDOSCOPY Left 07/02/2017   Procedure: FLEXIBLE SIGMOIDOSCOPY;  Surgeon: Laurence Spates, MD;  Location: [address];  Service: Endoscopy;  Laterality: Left;  . FOREIGN BODY REMOVAL  09/2003   via upper endoscopy/notes 02/12/2011  . GIVENS CAPSULE STUDY  09/30/2011   Procedure: GIVENS CAPSULE STUDY;  Surgeon: Jeryl Columbia, MD;  Location: [facility] ENDOSCOPY;  Service: Endoscopy;  Laterality: N/A;  . INSERTION OF DIALYSIS CATHETER Right 2014  . INSERTION OF DIALYSIS CATHETER Left 02/11/2013   Procedure: INSERTION OF DIALYSIS CATHETER;  Surgeon: Conrad Barnsdall, MD;  Location: [place];  Service: Vascular;  Laterality: Left;  Ultrasound guided  . LUMBAR LAMINECTOMY/DECOMPRESSION MICRODISCECTOMY Bilateral 07/31/2017   Procedure: LAMINECTOMY AND FORAMINOTOMY- BILATERAL LUMBAR TWO- LUMBAR THREE;  Surgeon: Earnie Larsson, MD;  Location: [facility];  Service: Neurosurgery;  Laterality: Bilateral;  LAMINECTOMY AND FORAMINOTOMY- BILATERAL LUMBAR 2- LUMBAR 3  . REMOVAL OF A DIALYSIS  CATHETER Right 02/11/2013   Procedure: REMOVAL OF A DIALYSIS CATHETER;  Surgeon: Conrad Miamisburg, MD;  Location: [place];  Service: Vascular;  Laterality: Right;  . SAVORY DILATION N/A 04/07/2015   Procedure: SAVORY DILATION;  Surgeon: Teena Irani, MD;  Location: [facility] ENDOSCOPY;  Service: Endoscopy;  Laterality: N/A;  . SHUNTOGRAM N/A 09/20/2011   Procedure: Earney Mallet;  Surgeon: Conrad Splendora, MD;  Location: [facility] CATH LAB;  Service: Cardiovascular;  Laterality: N/A;  . SVT ABLATION N/A 11/26/2016   Procedure: SVT Ablation;  Surgeon: Will Meredith Leeds, MD;  Location: [place] CV LAB;  Service: Cardiovascular;  Laterality: N/A;  . TONSILLECTOMY    . TOTAL KNEE ARTHROPLASTY Right 08/02/2015   Procedure: TOTAL KNEE ARTHROPLASTY;  Surgeon: Renette Butters, MD;  Location: [place];  Service: Orthopedics;  Laterality: Right;  . VENOGRAM N/A 01/26/2013   Procedure: VENOGRAM;  Surgeon: Angelia Mould, MD;  Location: [facility] CATH LAB;  Service: Cardiovascular;  Laterality: N/A;        Home Medications    Prior to Admission medications   Medication Sig Start Date End Date Taking? Authorizing Provider  acetaminophen (TYLENOL) 325 MG tablet Take 650 mg by mouth every 6 (six) hours as needed for mild pain or headache.     [provider]  allopurinol (ZYLOPRIM) 100 MG tablet TAKE ONE TABLET BY MOUTH ONCE DAILY 10/14/17   Biagio Borg, MD  aspirin EC 81 MG tablet Take 81 mg by mouth daily.    [provider]  citalopram (CELEXA) 10 MG tablet Take 1 tablet (10 mg total) by mouth at bedtime. 10/14/17   Biagio Borg, MD  cyclobenzaprine (FLEXERIL) 10 MG tablet Take 1 tablet (10 mg total) by mouth 3 (three) times daily as needed for muscle spasms. 08/01/17   Earnie Larsson, MD  ezetimibe (ZETIA) 10 MG tablet TAKE ONE TABLET BY MOUTH ONCE DAILY 04/08/17   Bensimhon, Shaune Pascal, MD  gabapentin (NEURONTIN) 300 MG capsule Take 1 capsule (300 mg total) by mouth at bedtime. Patient taking differently: Take 600 mg  by mouth at bedtime.  07/05/17   Domenic Polite, MD  hydroxypropyl methylcellulose / hypromellose (ISOPTO TEARS / GONIOVISC) 2.5 % ophthalmic solution Place 1 drop into both eyes 3 (three) times daily as needed for dry eyes. 05/03/17   Biagio Borg, MD  lanthanum (FOSRENOL) 1000 MG chewable tablet Chew 1,000 mg by mouth 2 (two) times daily.    [provider]  mesalamine (CANASA) 1000 MG suppository Place 0.5 suppositories (500 mg total) rectally at bedtime. Patient taking differently: Place 500 mg rectally at bedtime as needed (for flare up).  07/05/17   Domenic Polite, MD  methimazole (TAPAZOLE) 5 MG tablet Take 1 tablet (5 mg total) by mouth daily. 10/24/16   Renato Shin, MD  midodrine (PROAMATINE) 10 MG tablet Take 1 tablet (10 mg total) by mouth See admin instructions. Once a day only on dialysis days (Tues/Thurs/Sat) 10/18/16   Regalado, Jerald Kief A, MD  multivitamin (RENA-VIT) TABS tablet Take 1 tablet by mouth daily. Reported on 10/24/2015    [provider]  oxyCODONE (OXY IR/ROXICODONE) 5 MG immediate release tablet Take 5 mg by mouth every 6 (six) hours as needed. For pain. 07/03/17   [provider]  pantoprazole (PROTONIX) 40 MG tablet Take 40 mg by mouth every evening.  08/31/16   [provider]  polyethylene glycol (MIRALAX / GLYCOLAX) packet Take 17 g by mouth daily. Patient taking differently: Take 17 g by mouth daily as needed for mild constipation.  05/23/17   Florencia Reasons, MD  SENSIPAR 90 MG tablet Take 90 mg by mouth 2 (two) times daily. 09/18/16   [provider]  [facility] 625 MG tablet TAKE THREE TABLETS BY MOUTH TWICE DAILY WITH MEALS 10/04/17   Biagio Borg, MD  zolpidem (AMBIEN) 5 MG tablet Take 1 tablet (5 mg total) by mouth at bedtime as needed for sleep. 10/14/17   Biagio Borg, MD    Family History Family History  Problem Relation Age of Onset  . Heart disease Sister   . Thyroid nodules Sister   . Heart disease Father   . Diabetes  Father   . Kidney failure Father   . Hypertension Father   . Healthy Child   . Healthy Child   . Healthy Child   . Cancer Neg Hx     Social History Social History   Tobacco Use  . Smoking status: Former Smoker    Packs/day: 1.00    Years: 25.00    Pack years: 25.00    Types: Cigarettes    Last attempt  to quit: 10/01/2005    Years since quitting: 12.2  . Smokeless tobacco: Former Systems developer    Quit date: 10/01/2005  . Tobacco comment: Quit smoking 2007 Smoked x 25 years 1/2 ppd.  Substance Use Topics  . Alcohol use: No    Alcohol/week: 0.0 oz  . Drug use: No     Allergies   Cephalexin; Statins; and Ciprofloxacin   Review of Systems Review of Systems  Constitutional: Negative for chills, diaphoresis and fever.  HENT: Negative for congestion.   Respiratory: Positive for shortness of breath. Negative for cough and wheezing.   Cardiovascular: Positive for chest pain.  Gastrointestinal: Positive for nausea. Negative for abdominal pain, constipation and vomiting.  Genitourinary: Negative for dysuria.  Musculoskeletal: Negative for arthralgias and myalgias.  Skin: Negative for color change and wound.  Neurological: Negative for dizziness, weakness and numbness.  All other systems reviewed and are negative.    Physical Exam Updated Vital Signs BP 125/74   Pulse 72   Temp 97.7 F (36.5 C) (Oral)   Resp 10   Ht 6\' 1"  (1.854 m)   Wt 103.4 kg (228 lb)   SpO2 100%   BMI 30.08 kg/m   Physical Exam  Constitutional: He is oriented to person, place, and time. He appears well-developed and well-nourished.  Non-toxic appearance. No distress.  HENT:  Head: Normocephalic and atraumatic.  Eyes: Conjunctivae are normal.  Neck: Normal range of motion. Neck supple. No JVD present.  Cardiovascular: Normal rate, regular rhythm, intact distal pulses and normal pulses.  No murmur heard. Pulses:      Dorsalis pedis pulses are 2+ on the right side, and 2+ on the left side.        Posterior tibial pulses are 2+ on the right side, and 2+ on the left side.  Pulmonary/Chest: Effort normal and breath sounds normal. No respiratory distress. He has no decreased breath sounds. He has no wheezes. He has no rhonchi.  Abdominal: Soft. Bowel sounds are normal. He exhibits no distension. There is no tenderness. There is no guarding.  Musculoskeletal: He exhibits no edema.       Right lower leg: Normal. He exhibits no tenderness and no edema.       Left lower leg: Normal. He exhibits no tenderness and no edema.  Neurological: He is alert and oriented to person, place, and time.  Skin: Skin is warm and dry.  Psychiatric: He has a normal mood and affect.  Nursing note and vitals reviewed.    ED Treatments / Results  Labs (all labs ordered are listed, but only abnormal results are displayed) Labs Reviewed  BASIC METABOLIC PANEL - Abnormal; Notable for the following components:      Result Value   CO2 20 (*)    Glucose, Bld 203 (*)    BUN 43 (*)    Creatinine, Ser 10.06 (*)    Calcium 8.4 (*)    GFR calc non Af Amer 5 (*)    GFR calc Af Amer 5 (*)    Anion gap 16 (*)    All other components within normal limits  CBC - Abnormal; Notable for the following components:   RBC 3.68 (*)    Hemoglobin 11.4 (*)    HCT 37.0 (*)    MCV 100.5 (*)    RDW 17.1 (*)    All other components within normal limits  I-STAT TROPONIN, ED  I-STAT TROPONIN, ED    EKG None  Radiology Dg Chest 2 View  Result Date: 01/15/2018 CLINICAL DATA:  Chest pain EXAM: CHEST - 2 VIEW COMPARISON:  05/18/2017 FINDINGS: Normal heart size. Aortic tortuosity. Left midlung linear opacity is stable and attributed to scarring. Similar faint linear densities at the bases. Large lung volumes with diaphragm flattening in the lateral view. There is no edema, consolidation, effusion, or pneumothorax. Venous stenting in the right upper extremity. IMPRESSION: No acute finding when compared to prior. Electronically  Signed   By: Monte Fantasia M.D.   On: 01/15/2018 14:09    Procedures Procedures (including critical care time)  Medications Ordered in ED Medications - No data to display   Initial Impression / Assessment and Plan / ED Course  I have reviewed the triage vital signs and the nursing notes.  Pertinent labs & imaging results that were available during my care of the patient were reviewed by me and considered in my medical decision making (see chart for details).  Clinical Course as of Jan 17 123  Wed Jan 15, 2018  1912 Spoke with Dr. Blaine Hamper who agreed to admit patient.    [EH]    Clinical Course User Index [EH] Lorin Glass, PA-C   Concern for cardiac etiology of Chest Pain. Cardiology has been consulted and will see patient in the ED for likely admit. Pt does not meet criteria for CP protocol and a further evaluation is recommended. Pt has been re-evaluated prior to consult and VSS, NAD, heart RRR, pain 0/10, lungs CTAB. No acute abnormalities found on EKG and first round of cardiac enzymes negative. This case was discussed with Dr. Tomi Bamberger who has seen the patient and agrees with plan to admit.    Final Clinical Impressions(s) / ED Diagnoses   Final diagnoses:  Chest pain, unspecified type    ED Discharge Orders    None       Ollen Gross 01/16/18 0125    Dorie Rank, MD 01/16/18 1217

## 2018-01-15 NOTE — ED Triage Notes (Signed)
Pt arrives via POv from home with 8/10 left sided chest pain nonradiating. Some SOB and weakness with the pain. Pt is T/TH/SAT dialysis. Pt rating pain 5/10. Skin warm dry. VSS

## 2018-01-15 NOTE — Telephone Encounter (Signed)
Advanced Heart Failure Triage Encounter  Patient Name: [name]  Date of Call: 01/15/18  Problem:  Patient's wife called to report that patient has been having intermittent sharp chest pain since yesterday.  Had another episode this morning.   Plan:  I told her that if he is having active chest pain he needs to go to the ER immediatly.  She spoke with patient while I was on the phone and he confirmed he was still having chest pain and verbalized he would go to the ER. No further questions.   Darron Doom, RN

## 2018-01-16 ENCOUNTER — Encounter (HOSPITAL_COMMUNITY): Payer: Self-pay | Admitting: Cardiology

## 2018-01-16 ENCOUNTER — Other Ambulatory Visit: Payer: Self-pay | Admitting: Medical

## 2018-01-16 ENCOUNTER — Inpatient Hospital Stay (HOSPITAL_COMMUNITY): Payer: Medicare Other

## 2018-01-16 ENCOUNTER — Other Ambulatory Visit: Payer: Self-pay | Admitting: Pharmacy Technician

## 2018-01-16 DIAGNOSIS — E1122 Type 2 diabetes mellitus with diabetic chronic kidney disease: Secondary | ICD-10-CM | POA: Diagnosis not present

## 2018-01-16 DIAGNOSIS — R072 Precordial pain: Secondary | ICD-10-CM

## 2018-01-16 DIAGNOSIS — E785 Hyperlipidemia, unspecified: Secondary | ICD-10-CM | POA: Diagnosis not present

## 2018-01-16 DIAGNOSIS — I200 Unstable angina: Secondary | ICD-10-CM | POA: Diagnosis not present

## 2018-01-16 DIAGNOSIS — G4733 Obstructive sleep apnea (adult) (pediatric): Secondary | ICD-10-CM | POA: Diagnosis not present

## 2018-01-16 DIAGNOSIS — R079 Chest pain, unspecified: Secondary | ICD-10-CM

## 2018-01-16 DIAGNOSIS — F329 Major depressive disorder, single episode, unspecified: Secondary | ICD-10-CM | POA: Diagnosis not present

## 2018-01-16 DIAGNOSIS — E1121 Type 2 diabetes mellitus with diabetic nephropathy: Secondary | ICD-10-CM | POA: Diagnosis not present

## 2018-01-16 DIAGNOSIS — Z9861 Coronary angioplasty status: Secondary | ICD-10-CM

## 2018-01-16 DIAGNOSIS — E0590 Thyrotoxicosis, unspecified without thyrotoxic crisis or storm: Secondary | ICD-10-CM

## 2018-01-16 DIAGNOSIS — Z992 Dependence on renal dialysis: Secondary | ICD-10-CM | POA: Diagnosis not present

## 2018-01-16 DIAGNOSIS — I132 Hypertensive heart and chronic kidney disease with heart failure and with stage 5 chronic kidney disease, or end stage renal disease: Secondary | ICD-10-CM | POA: Diagnosis not present

## 2018-01-16 DIAGNOSIS — N186 End stage renal disease: Secondary | ICD-10-CM | POA: Diagnosis not present

## 2018-01-16 DIAGNOSIS — D631 Anemia in chronic kidney disease: Secondary | ICD-10-CM | POA: Diagnosis not present

## 2018-01-16 DIAGNOSIS — I25119 Atherosclerotic heart disease of native coronary artery with unspecified angina pectoris: Secondary | ICD-10-CM | POA: Diagnosis not present

## 2018-01-16 DIAGNOSIS — E1140 Type 2 diabetes mellitus with diabetic neuropathy, unspecified: Secondary | ICD-10-CM | POA: Diagnosis not present

## 2018-01-16 DIAGNOSIS — I5042 Chronic combined systolic (congestive) and diastolic (congestive) heart failure: Secondary | ICD-10-CM

## 2018-01-16 DIAGNOSIS — I2510 Atherosclerotic heart disease of native coronary artery without angina pectoris: Secondary | ICD-10-CM | POA: Diagnosis not present

## 2018-01-16 DIAGNOSIS — E039 Hypothyroidism, unspecified: Secondary | ICD-10-CM | POA: Diagnosis not present

## 2018-01-16 DIAGNOSIS — K219 Gastro-esophageal reflux disease without esophagitis: Secondary | ICD-10-CM

## 2018-01-16 DIAGNOSIS — G629 Polyneuropathy, unspecified: Secondary | ICD-10-CM | POA: Diagnosis not present

## 2018-01-16 DIAGNOSIS — I9589 Other hypotension: Secondary | ICD-10-CM | POA: Diagnosis not present

## 2018-01-16 DIAGNOSIS — I4581 Long QT syndrome: Secondary | ICD-10-CM | POA: Diagnosis not present

## 2018-01-16 DIAGNOSIS — Z9981 Dependence on supplemental oxygen: Secondary | ICD-10-CM | POA: Diagnosis not present

## 2018-01-16 DIAGNOSIS — N2581 Secondary hyperparathyroidism of renal origin: Secondary | ICD-10-CM | POA: Diagnosis not present

## 2018-01-16 LAB — CBC
HEMATOCRIT: 33.7 % — AB (ref 39.0–52.0)
HEMOGLOBIN: 10.6 g/dL — AB (ref 13.0–17.0)
MCH: 31.1 pg (ref 26.0–34.0)
MCHC: 31.5 g/dL (ref 30.0–36.0)
MCV: 98.8 fL (ref 78.0–100.0)
Platelets: 287 10*3/uL (ref 150–400)
RBC: 3.41 MIL/uL — ABNORMAL LOW (ref 4.22–5.81)
RDW: 17.1 % — AB (ref 11.5–15.5)
WBC: 7 10*3/uL (ref 4.0–10.5)

## 2018-01-16 LAB — GLUCOSE, CAPILLARY
Glucose-Capillary: 153 mg/dL — ABNORMAL HIGH (ref 65–99)
Glucose-Capillary: 205 mg/dL — ABNORMAL HIGH (ref 65–99)

## 2018-01-16 LAB — RENAL FUNCTION PANEL
ALBUMIN: 3.4 g/dL — AB (ref 3.5–5.0)
ANION GAP: 16 — AB (ref 5–15)
BUN: 55 mg/dL — AB (ref 6–20)
CO2: 20 mmol/L — AB (ref 22–32)
Calcium: 8.4 mg/dL — ABNORMAL LOW (ref 8.9–10.3)
Chloride: 103 mmol/L (ref 101–111)
Creatinine, Ser: 12.26 mg/dL — ABNORMAL HIGH (ref 0.61–1.24)
GFR calc Af Amer: 4 mL/min — ABNORMAL LOW (ref >60)
GFR calc non Af Amer: 4 mL/min — ABNORMAL LOW (ref >60)
Glucose, Bld: 169 mg/dL — ABNORMAL HIGH (ref 65–99)
PHOSPHORUS: 6.6 mg/dL — AB (ref 2.5–4.6)
POTASSIUM: 5.1 mmol/L (ref 3.5–5.1)
SODIUM: 139 mmol/L (ref 135–145)

## 2018-01-16 LAB — LIPID PANEL
CHOLESTEROL: 278 mg/dL — AB (ref 0–200)
HDL: 48 mg/dL (ref >40)
LDL Cholesterol: 196 mg/dL — ABNORMAL HIGH (ref 0–99)
TRIGLYCERIDES: 169 mg/dL — AB (ref <150)
Total CHOL/HDL Ratio: 5.8 RATIO
VLDL: 34 mg/dL (ref 0–40)

## 2018-01-16 LAB — CBG MONITORING, ED: Glucose-Capillary: 134 mg/dL — ABNORMAL HIGH (ref 65–99)

## 2018-01-16 LAB — TROPONIN I
Troponin I: 0.03 ng/mL (ref <0.03)
Troponin I: <0.03 ng/mL (ref <0.03)

## 2018-01-16 MED ORDER — FERRIC CITRATE 1 GM 210 MG(FE) PO TABS
420.0000 mg | ORAL_TABLET | Freq: Three times a day (TID) | ORAL | Status: DC
Start: 2018-01-16 — End: 2018-01-16
  Administered 2018-01-16 (×2): 420 mg via ORAL
  Filled 2018-01-16 (×2): qty 2

## 2018-01-16 MED ORDER — LIDOCAINE HCL (PF) 1 % IJ SOLN: 5.0000 mL | INTRAMUSCULAR | Status: DC | PRN

## 2018-01-16 MED ORDER — SODIUM CHLORIDE 0.9 % IV SOLN: 100.0000 mL | INTRAVENOUS | Status: DC | PRN

## 2018-01-16 MED ORDER — FERRIC CITRATE 1 GM 210 MG(FE) PO TABS
210.0000 mg | ORAL_TABLET | Freq: Two times a day (BID) | ORAL | Status: DC | PRN
  Filled 2018-01-16: qty 1

## 2018-01-16 MED ORDER — ALTEPLASE 2 MG IJ SOLR: 2.0000 mg | Freq: Once | INTRAMUSCULAR | Status: DC | PRN

## 2018-01-16 MED ORDER — HEPARIN SODIUM (PORCINE) 1000 UNIT/ML DIALYSIS: 1000.0000 [IU] | INTRAMUSCULAR | Status: DC | PRN

## 2018-01-16 MED ORDER — PENTAFLUOROPROP-TETRAFLUOROETH EX AERO: 1.0000 "application " | INHALATION_SPRAY | CUTANEOUS | Status: DC | PRN

## 2018-01-16 MED ORDER — LIDOCAINE-PRILOCAINE 2.5-2.5 % EX CREA: 1.0000 "application " | TOPICAL_CREAM | CUTANEOUS | Status: DC | PRN

## 2018-01-16 NOTE — Consult Note (Addendum)
[facility] Renal Consultation Note    Indication for Consultation:  Management of ESRD/hemodialysis; anemia, hypertension/volume and secondary hyperparathyroidism  JWJ:XBJY, Hunt Oris, MD  HPI: [name] is a 67 y.o. male. ESRD 2/2 DM/HTN on HD TTS at [facility], first starting in 08/2008.  Past medical history significant for CAD s/p stents, DMT2, HTN, ischemic CM (EF 40-45%), Hx SVT, hx depression, DDD w/RLE radiculopathy, hyperthyroidism, OSA on CPAP, GERD, Hx GIB and Hx prostate CA.  He is being admitted due to evaluation and management of chest pain.  Seen and examined at bedside.  Presented to the ED 1 day ago due to chest pain starting 2 nights ago.  No associated symptoms.   Reports nausea a few hours before CP started.  States he has been CP free since getting to the hospital.  Denies SOB, n/v/d, abdominal pain, fever, chills, weakness and edema.  Pertinent findings include CXR with no acute abnormalities.  Of note, patient is compliant with prescribed HD treatments.  Last dialysis on 4/16, well tolerated, without complications, meeting dry weight.  Past Medical History:  Diagnosis Date  . Allergic rhinitis, cause unspecified 02/24/2014  . Anemia 06/16/2011  . BENIGN PROSTATIC HYPERTROPHY 10/14/2009  . CAD, NATIVE VESSEL 02/06/2009   a. 06/2007 s/p Taxus DES to the RCA;  b. 08/2016 NSTEMI in setting of SVT/PCI: LM 30ost, LAD 38m (3.0x16 Synergy DES), LCX 76m, OM1 60, RI 40, RCA 70p/m, 56m - not amenable to PCI.  [name] Cervical radiculopathy, chronic 02/23/2016   Right c5-6 by NCS/EMG  . Chronic combined systolic (congestive) and diastolic (congestive) heart failure ([place])    a. 10/2016 Echo: EF 40-45%, Gr2 DD. mildly dil LA.  [name] COLONIC POLYPS, HX OF 10/14/2009  . Dementia [id number]  . Depression 09/24/2015  . DIABETES MELLITUS, TYPE II 02/01/2010  . DYSLIPIDEMIA 06/18/2007  . ESRD (end stage renal disease) on dialysis ([place]) 08/04/2010   "TTS;  " (04/18/2015)  . FOOT PAIN  08/12/2008  . GAIT DISTURBANCE 03/03/2010  . GASTROENTERITIS, VIRAL 10/14/2009  . GERD 06/18/2007  . GOITER, MULTINODULAR 12/26/2007  . GOUT 06/18/2007  . GYNECOMASTIA 07/17/2010  . Hemodialysis access, fistula mature [facility])    Dialysis T-Th-Sa ([place]) Right upper arm fistula  . Hyperlipidemia 10/16/2011  . Hyperparathyroidism, secondary ([place]) 06/16/2011  . HYPERTENSION 06/18/2007  . Hyperthyroidism   . Hypocalcemia 06/07/2010  . Ischemic cardiomyopathy    a. 10/2016 Echo: EF 40-45%.  . Lumbar stenosis with neurogenic claudication   . ONYCHOMYCOSIS, TOENAILS 12/26/2007  . OSA on CPAP 10/16/2011  . Other malaise and fatigue 11/24/2009  . PERIPHERAL NEUROPATHY 06/18/2007  . Prostate cancer ([place])   . PSVT (paroxysmal supraventricular tachycardia) ([place])    a. 05/6577 complicated by NSTEMI;  b. 11/2016 Treated w/ adenosine in ED;  c. 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  [name] PULMONARY NODULE, RIGHT LOWER LOBE 06/08/2009  . Sleep apnea    cpap machine and o2  . TRANSAMINASES, SERUM, ELEVATED 02/01/2010  . Transfusion history    none recent  . Unspecified hypotension 01/30/2010   Past Surgical History:  Procedure Laterality Date  . ARTERIOVENOUS GRAFT PLACEMENT Right 2009   forearm/notes 02/01/2011  . AV FISTULA PLACEMENT  11/07/2011   Procedure: INSERTION OF ARTERIOVENOUS (AV) GORE-TEX GRAFT ARM;  Surgeon: Tinnie Gens, MD;  Location: [place];  Service: Vascular;  Laterality: Left;  . BACK SURGERY  1998  . BASCILIC VEIN TRANSPOSITION Right 02/27/2013   Procedure: [place];  Surgeon:  Mal Misty, MD;  Location: [name];  Service: Vascular;  Laterality: Right;  Right Basilic Vein Transposition   . CARDIAC CATHETERIZATION N/A 08/06/2016   Procedure: Left Heart Cath and Coronary Angiography;  Surgeon: Jolaine Artist, MD;  Location: [place] CV LAB;  Service: Cardiovascular;  Laterality: N/A;  . CARDIAC CATHETERIZATION N/A 08/07/2016   Procedure: Coronary/Graft Atherectomy-CSI  LAD;  Surgeon: Peter M Martinique, MD;  Location: [place] CV LAB;  Service: Cardiovascular;  Laterality: N/A;  . CERVICAL SPINE SURGERY  2/09   "to repair nerve problems in my left arm"  . CHOLECYSTECTOMY    . COLONOSCOPY WITH PROPOFOL N/A 04/26/2017   Procedure: COLONOSCOPY WITH PROPOFOL;  Surgeon: Otis Brace, MD;  Location: [place];  Service: Gastroenterology;  Laterality: N/A;  . CORONARY ANGIOPLASTY WITH STENT PLACEMENT  06/11/2008  . CORONARY ANGIOPLASTY WITH STENT PLACEMENT  06/2007   TAXUS stent to RCA/notes 01/31/2011  . ESOPHAGOGASTRODUODENOSCOPY  09/28/2011   Procedure: ESOPHAGOGASTRODUODENOSCOPY (EGD);  Surgeon: Missy Sabins, MD;  Location: [facility] ENDOSCOPY;  Service: Endoscopy;  Laterality: N/A;  . ESOPHAGOGASTRODUODENOSCOPY N/A 04/07/2015   Procedure: ESOPHAGOGASTRODUODENOSCOPY (EGD);  Surgeon: Teena Irani, MD;  Location: [facility];  Service: Endoscopy;  Laterality: N/A;  . ESOPHAGOGASTRODUODENOSCOPY N/A 04/19/2015   Procedure: ESOPHAGOGASTRODUODENOSCOPY (EGD);  Surgeon: Arta Silence, MD;  Location: [facility] ENDOSCOPY;  Service: Endoscopy;  Laterality: N/A;  . FLEXIBLE SIGMOIDOSCOPY N/A 05/21/2017   Procedure: Conni Elliot;  Surgeon: Clarene Essex, MD;  Location: [place];  Service: Endoscopy;  Laterality: N/A;  . FLEXIBLE SIGMOIDOSCOPY Left 07/02/2017   Procedure: FLEXIBLE SIGMOIDOSCOPY;  Surgeon: Laurence Spates, MD;  Location: [place];  Service: Endoscopy;  Laterality: Left;  . FOREIGN BODY REMOVAL  09/2003   via upper endoscopy/notes 02/12/2011  . GIVENS CAPSULE STUDY  09/30/2011   Procedure: GIVENS CAPSULE STUDY;  Surgeon: Jeryl Columbia, MD;  Location: [facility] ENDOSCOPY;  Service: Endoscopy;  Laterality: N/A;  . INSERTION OF DIALYSIS CATHETER Right 2014  . INSERTION OF DIALYSIS CATHETER Left 02/11/2013   Procedure: INSERTION OF DIALYSIS CATHETER;  Surgeon: Conrad Jemez Pueblo, MD;  Location: [place];  Service: Vascular;  Laterality: Left;  Ultrasound guided  . LUMBAR  LAMINECTOMY/DECOMPRESSION MICRODISCECTOMY Bilateral 07/31/2017   Procedure: LAMINECTOMY AND FORAMINOTOMY- BILATERAL LUMBAR TWO- LUMBAR THREE;  Surgeon: Earnie Larsson, MD;  Location: [place];  Service: Neurosurgery;  Laterality: Bilateral;  LAMINECTOMY AND FORAMINOTOMY- BILATERAL LUMBAR 2- LUMBAR 3  . REMOVAL OF A DIALYSIS CATHETER Right 02/11/2013   Procedure: REMOVAL OF A DIALYSIS CATHETER;  Surgeon: Conrad Riverview, MD;  Location: [place];  Service: Vascular;  Laterality: Right;  . SAVORY DILATION N/A 04/07/2015   Procedure: SAVORY DILATION;  Surgeon: Teena Irani, MD;  Location: [facility] ENDOSCOPY;  Service: Endoscopy;  Laterality: N/A;  . SHUNTOGRAM N/A 09/20/2011   Procedure: Ear[place] Mallet;  Surgeon: Conrad Wilmer, MD;  Location: [facility] CATH LAB;  Service: Cardiovascular;  Laterality: N/A;  . SVT ABLATION N/A 11/26/2016   Procedure: SVT Ablation;  Surgeon: Will Meredith Leeds, MD;  Location: [place] CV LAB;  Service: Cardiovascular;  Laterality: N/A;  . TONSILLECTOMY    . TOTAL KNEE ARTHROPLASTY Right 08/02/2015   Procedure: TOTAL KNEE ARTHROPLASTY;  Surgeon: Renette Butters, MD;  Location: [place];  Service: Orthopedics;  Laterality: Right;  . VENOGRAM N/A 01/26/2013   Procedure: VENOGRAM;  Surgeon: Angelia Mould, MD;  Location: [facility] CATH LAB;  Service: Cardiovascular;  Laterality: N/A;   Family History  Problem Relation Age of Onset  .  Heart disease Sister   . Thyroid nodules Sister   . Heart disease Father   . Diabetes Father   . Kidney failure Father   . Hypertension Father   . Healthy Child   . Healthy Child   . Healthy Child   . Cancer Neg Hx    Social History:  reports that he quit smoking about 12 years ago. His smoking use included cigarettes. He has a 25.00 pack-year smoking history. He quit smokeless tobacco use about 12 years ago. He reports that he does not drink alcohol or use drugs. Allergies  Allergen Reactions  . Cephalexin Swelling and Other (See Comments)    Tongue swelling  .  Statins Other (See Comments)    Weak muscles  . Ciprofloxacin Rash   Current Facility-Administered Medications  Medication Dose Route Frequency Provider Last Rate Last Dose  . acetaminophen (TYLENOL) tablet 650 mg  650 mg Oral Q6H PRN Ivor Costa, MD      . allopurinol (ZYLOPRIM) tablet 100 mg  100 mg Oral Daily Ivor Costa, MD   100 mg at 01/16/18 0913  . aspirin EC tablet 81 mg  81 mg Oral Daily Ivor Costa, MD   81 mg at 01/16/18 0913  . cinacalcet (SENSIPAR) tablet 90 mg  90 mg Oral BID WC Ivor Costa, MD   90 mg at 01/16/18 1031  . citalopram (CELEXA) tablet 10 mg  10 mg Oral QHS Ivor Costa, MD   10 mg at 01/15/18 2332  . colesevelam [facility]) tablet 625 mg  625 mg Oral BID WC Ivor Costa, MD   625 mg at 01/16/18 1031  . cyclobenzaprine (FLEXERIL) tablet 10 mg  10 mg Oral TID PRN Ivor Costa, MD      . ezetimibe (ZETIA) tablet 10 mg  10 mg Oral Daily Ivor Costa, MD   10 mg at 01/16/18 0913  . gabapentin (NEURONTIN) capsule 600 mg  600 mg Oral QHS Ivor Costa, MD   600 mg at 01/15/18 2334  . heparin injection 5,000 Units  5,000 Units Subcutaneous Q8H Ivor Costa, MD   5,000 Units at 01/15/18 2344  . hydrALAZINE (APRESOLINE) injection 5 mg  5 mg Intravenous Q2H PRN Ivor Costa, MD      . hydroxypropyl methylcellulose / hypromellose (ISOPTO TEARS / GONIOVISC) 2.5 % ophthalmic solution 1 drop  1 drop Both Eyes TID PRN Ivor Costa, MD      . insulin aspart (novoLOG) injection 0-5 Units  0-5 Units Subcutaneous QHS Ivor Costa, MD      . insulin aspart (novoLOG) injection 0-9 Units  0-9 Units Subcutaneous TID WC Ivor Costa, MD      . lanthanum [facility]) chewable tablet 1,000 mg  1,000 mg Oral BID WC Ivor Costa, MD   1,000 mg at 01/16/18 0913  . midodrine (PROAMATINE) tablet 10 mg  10 mg Oral Q T,Th,Sat-1800 Ivor Costa, MD      . multivitamin (RENA-VIT) tablet 1 tablet  1 tablet Oral Daily Ivor Costa, MD   1 tablet at 01/16/18 0913  . nitroGLYCERIN (NITROSTAT) SL tablet 0.4 mg  0.4 mg Sublingual Q5 min PRN  Ivor Costa, MD      . ondansetron [facility]) injection 4 mg  4 mg Intravenous Q6H PRN Ivor Costa, MD      . oxyCODONE (Oxy IR/ROXICODONE) immediate release tablet 5 mg  5 mg Oral Q6H PRN Ivor Costa, MD      . pantoprazole (PROTONIX) EC tablet 40 mg  40 mg Oral QPM  Ivor Costa, MD   40 mg at 01/15/18 2334  . polyethylene glycol (MIRALAX / GLYCOLAX) packet 17 g  17 g Oral Daily PRN Ivor Costa, MD      . zolpidem (AMBIEN) tablet 5 mg  5 mg Oral QHS PRN Ivor Costa, MD       Labs: Basic Metabolic Panel: Recent Labs  Lab 01/15/18 1314  NA 138  K 4.8  CL 102  CO2 20*  GLUCOSE 203*  BUN 43*  CREATININE 10.06*  CALCIUM 8.4*   CBC: Recent Labs  Lab 01/15/18 1314  WBC 7.7  HGB 11.4*  HCT 37.0*  MCV 100.5*  PLT 283   Cardiac Enzymes: Recent Labs  Lab 01/15/18 2038 01/16/18 0110 01/16/18 0657  TROPONINI 0.03* 0.03* <0.03   CBG: Recent Labs  Lab 01/15/18 2343  GLUCAP 134*   Studies/Results: Dg Chest 2 View  Result Date: 01/15/2018 CLINICAL DATA:  Chest pain EXAM: CHEST - 2 VIEW COMPARISON:  05/18/2017 FINDINGS: Normal heart size. Aortic tortuosity. Left midlung linear opacity is stable and attributed to scarring. Similar faint linear densities at the bases. Large lung volumes with diaphragm flattening in the lateral view. There is no edema, consolidation, effusion, or pneumothorax. Venous stenting in the right upper extremity. IMPRESSION: No acute finding when compared to prior. Electronically Signed   By: Monte Fantasia M.D.   On: 01/15/2018 14:09    ROS: All others negative except those listed in HPI.  Physical Exam: Vitals:   01/15/18 2035 01/16/18 0000 01/16/18 0042 01/16/18 0119  BP: 132/80 130/70  117/79  Pulse: 76   72  Resp: 13 17  18   Temp:    98.7 F (37.1 C)  TempSrc:    Oral  SpO2: 98%   100%  Weight:   100.7 kg (221 lb 14.4 oz)   Height:   6\' 1"  (1.854 m)      General: WDWN, NAD, obese male laying in bed Head: NCAT sclera not icteric MMM Neck: Supple.  No lymphadenopathy. No JVD Lungs: CTA bilaterally. No wheeze, rales or rhonchi. Breathing is unlabored. Heart: RRR. No murmur, rubs or gallops.  Abdomen: soft, nontender, +BS, no guarding, no rebound tenderness Lower extremities:no edema, ischemic changes, or open wounds  Neuro: AAOx3. Moves all extremities spontaneously. Psych:  Responds to questions appropriately with a normal affect. Dialysis Access: RU AVF +b/t  Dialysis Orders:  TTS - [facility]  4hrs, BFR 350, DFR 800,  EDW 101.5kg, 2K/ 2Ca, Profile 1, Linear Na  Access: RUA AVF  Heparin None Mircera 50 mcg q2wks - last 4/16 [name] 55mcg IV qHD  Last Labs: 4/11: Hgb 10.9, P 6.5 3/28: TSAT 27%, K 5.0, Ca 9.3, PTH 328, Alb 4.0  Assessment/Plan: 1.  Chest pain w/known CAD- Cardiology consulting 2.  ESRD -  HD due today, orders written, continue per regular schedule while admitted. K 4.8. 3.  Hypotension/volume  - chronic hypotension on midodrine. Appears euvolemic on exam.  Pre HD wt at EDW, titrate down as tolerated.  Likely needs lower EDW at d/c. 4.  Anemia of CKD - Hgb 11.4 - no indication for ESA at this time 5.  Secondary Hyperparathyroidism -  Ca in goal. OP P elevated. Continue binders, VDRA and sensipar. 6. Nutrition - Renal diet w/fluid restrictions. Nepro. Renavite. 7. DMT2 w/neuropathy - per primary 8. HLD - per primary 9. Hx Prolonged QT - avoid meds that cause prolonged QT 10. OSA - on CPAP 11. Hyperthyroidism - per primary 12. Combined  systolic & diastolic HF - last EF 98-92%, per primary  Jen Mow, PA-C [facility] Pager: [phone] 01/16/2018, 11:10 AM   Pt seen, examined and agree w A/P as above. ESRD pt with chest pain.  No gross vol excess.  Under dry wt, may be losing body weight.  No edema on CXR.  Will follow. HD today on schedule.  Kelly Splinter MD [employer] pager [phone]   01/16/2018, 2:27 PM

## 2018-01-16 NOTE — Patient Outreach (Signed)
[facility]) Care Management  01/16/2018  [name] 1950/12/21 [id number]   Successful outreach call to [name], HIPAA identifiers verified. Informed [name] that patient has been approved and drug should be there in the next 7-10 business days. [name] informed me that [name] is currently in the hospital.  Will follow up in the next 2 weeks to verify medication was received.  Maud Deed [place], [place] Management [phone]

## 2018-01-16 NOTE — Procedures (Signed)
Pt seen on HD , stable, no issues.  UF goal 2 L, attempting to lower edw.   I was present at this dialysis session, have reviewed the session itself and made  appropriate changes Kelly Splinter MD [place] pager [phone]   01/16/2018, 2:28 PM

## 2018-01-16 NOTE — Discharge Instructions (Signed)
° °  You have a Stress Test scheduled at [facility]. Your doctor has ordered this test to check the blood flow in your heart arteries. ° °Please arrive 15 minutes early for paperwork. The whole test will take several hours. You may want to bring reading material to remain occupied while undergoing different parts of the test. ° °Instructions: °· No food/drink after midnight the night before. °· It is OK to take your morning meds with a sip of water EXCEPT for those types of medicines listed below or otherwise instructed. °· No caffeine/decaf products 24 hours before, including medicines such as Excedrin or Goody Powders. Call if there are any questions.  °· Wear comfortable clothes and shoes.  ° °Special Medication Instructions: °· Beta blockers such as metoprolol (Lopressor/Toprol XL), atenolol (Tenormin), carvedilol (Coreg), nebivolol (Bystolic), bisoprolol (Zebeta), propranolol (Inderal) should not be taken for 24 hours before the test. °· Calcium channel blockers such as diltiazem (Cardizem) or verapmil (Calan) should not be taken for 24 hours before the test. °· Remove nitroglycerin patches and do not take nitrate preparations such as Imdur/isosorbide the day of your test. °· No Persantine/Theophylline or Aggrenox medicines should be used within 24 hours of the test.  °· If you are diabetic, please ask which medications to hold the day of the test ° °What To Expect: °When you arrive in the lab, the technician will inject a small amount of radioactive tracer into your arm through an IV while you are resting quietly. This helps us to form pictures of your heart. You will likely only feel a sting from the IV. After a waiting period, resting pictures will be obtained under a big camera. These are the "before" pictures. ° °Next, you will be prepped for the stress portion of the test. This may include either walking on a treadmill or receiving a medicine that helps to dilate blood vessels in  your heart to simulate the effect of exercise on your heart. If you are walking on a treadmill, you will walk at different paces to try to get your heart rate to a goal number that is based on your age. If your doctor has chosen the pharmacologic test, then you will receive a medicine through your IV that may cause temporary nausea, flushing, shortness of breath and sometimes chest discomfort or vomiting. This is typically short-lived and usually resolves quickly. If you experience symptoms, that does not automatically mean the test is abnormal. Some patients do not experience any symptoms at all. Your blood pressure and heart rate will be monitored, and we will be watching your EKG on a computer screen for any changes. During this portion of the test, the radiologist will inject another small amount of radioactive tracer into your IV. After a waiting period, you will undergo a second set of pictures. These are the "after" pictures. ° °The doctor reading the test will compare the before-and-after images to look for evidence of heart blockages or heart weakness. The test usually takes 1 day to complete, but in certain instances (for example, if a patient is over a certain weight limit), the test may be done over the span of 2 days. ° ° °

## 2018-01-16 NOTE — Discharge Summary (Signed)
Physician Discharge Summary  [name] ZOX:[id number] DOB: 1951/04/13 DOA: 01/15/2018  PCP: Biagio Borg, MD  Admit date: 01/15/2018 Discharge date: 01/16/2018  Admitted From: home Disposition:  home   Recommendations for Outpatient Follow-up:  1. Will have outpt Myoview stress test and referral to Lipid clinic to consider PCSK9  Discharge Condition:  stable   CODE STATUS:  Full code   Consultations:  cardiology    Discharge Diagnoses:  Principal Problem:   Chest pain Active Problems:   CAD S/P percutaneous coronary angioplasty   OSA (obstructive sleep apnea)   HLD (hyperlipidemia)   Hyperthyroidism   DM (diabetes mellitus) type II controlled with renal manifestation ([name])   ESRD on hemodialysis ([name])   Chronic combined systolic (congestive) and diastolic (congestive) heart failure     Gout   Depression   Gastro-esophageal reflux disease without esophagitis      Brief Summary: [name] is a 67 y.o. male with a hx of CAD s/p multiple PCI and NSTEMIs, chronic combined CHF (EF 40-45%, G2DD on echo 10/2016), AVNRT s/p ablation, HLD, DM type 2, ESRD on HD T/Th/Sat, COPD on home O2 qHS, OSA, and prostate cancer s/p XRT who presents with 2 episodes of chest pain.  [name] was in his usual state of health until after dialysis on Tuesday 01/14/18 when he experienced left-sided chest pressure while sitting in his car. He noted some associated nausea, but denied vomiting, radiation of pain, SOB, diaphoresis, dizziness, lightheadedness, or syncope. He states the pain lasted for 10-15 minutes and resolved spontaneously. He noted a second episode of chest pressure while laying in bed later that evening which again lasted 10-15 minutes without associated symptoms, and resolved spontaneously. On Wednesday morning, his wife called his cardiologist office and was advised to bring him to the ED for further evaluation. He states he did not have any chest pain on Wednesday and has not had a  reoccurrence since arrival to the hospital. He is unsure if the pain he experienced on Tuesday was similar to prior MI (NSTEMI 2017).     Hospital Course:  Chest pain with h/o CAD and stents - troponin negative, EKG unrevealing - cardiology has evaluated him and recommends and outpt Myoview which they will arrange - he is already taking aspirin, zetia and welchol   ESRD on HD - he is receiving dialysis in the hospital today as this is his regular day-   All other medical issues remained stable during his overnight stay.  Discharge Exam: Vitals:   01/16/18 0000 01/16/18 0119  BP: 130/70 117/79  Pulse:  72  Resp: 17 18  Temp:  98.7 F (37.1 C)  SpO2:  100%   Vitals:   01/15/18 2035 01/16/18 0000 01/16/18 0042 01/16/18 0119  BP: 132/80 130/70  117/79  Pulse: 76   72  Resp: 13 17  18   Temp:    98.7 F (37.1 C)  TempSrc:    Oral  SpO2: 98%   100%  Weight:   100.7 kg (221 lb 14.4 oz)   Height:   6\' 1"  (1.854 m)     General: Pt is alert, awake, not in acute distress Cardiovascular: RRR, S1/S2 +, no rubs, no gallops Respiratory: CTA bilaterally, no wheezing, no rhonchi Abdominal: Soft, NT, ND, bowel sounds + Extremities: no edema, no cyanosis   Discharge Instructions  Discharge Instructions    Diet general   Complete by:  As directed    Renal low sodium, low cholesterol,  heart healthy diet   Increase activity slowly   Complete by:  As directed      Allergies as of 01/16/2018      Reactions   Cephalexin Swelling, Other (See Comments)   Tongue swelling   Statins Other (See Comments)   Weak muscles   Ciprofloxacin Rash      Medication List    TAKE these medications   acetaminophen 325 MG tablet Commonly known as:  TYLENOL Take 650 mg by mouth every 6 (six) hours as needed for mild pain or headache.   allopurinol 100 MG tablet Commonly known as:  ZYLOPRIM TAKE ONE TABLET BY MOUTH ONCE DAILY What changed:    how much to take  how to take this  when to  take this   aspirin EC 81 MG tablet Take 81 mg by mouth daily.   AURYXIA 1 GM 210 MG(Fe) tablet Generic drug:  ferric citrate Take 420 mg by mouth 3 (three) times daily with meals.   citalopram 10 MG tablet Commonly known as:  CELEXA Take 1 tablet (10 mg total) by mouth at bedtime.   ezetimibe 10 MG tablet Commonly known as:  ZETIA TAKE ONE TABLET BY MOUTH ONCE DAILY   gabapentin 300 MG capsule Commonly known as:  NEURONTIN Take 1 capsule (300 mg total) by mouth at bedtime. What changed:  how much to take   hydroxypropyl methylcellulose / hypromellose 2.5 % ophthalmic solution Commonly known as:  ISOPTO TEARS / GONIOVISC Place 1 drop into both eyes 3 (three) times daily as needed for dry eyes.   lanthanum 1000 MG chewable tablet Commonly known as:  FOSRENOL Chew 1,000 mg by mouth 2 (two) times daily.   midodrine 10 MG tablet Commonly known as:  PROAMATINE Take 1 tablet (10 mg total) by mouth See admin instructions. Once a day only on dialysis days (Tues/Thurs/Sat)   multivitamin Tabs tablet Take 1 tablet by mouth daily. Reported on 10/24/2015   pantoprazole 40 MG tablet Commonly known as:  PROTONIX Take 40 mg by mouth every evening.   polyethylene glycol packet Commonly known as:  MIRALAX / GLYCOLAX Take 17 g by mouth daily. What changed:    when to take this  reasons to take this   SENSIPAR 90 MG tablet Generic drug:  cinacalcet Take 90 mg by mouth 2 (two) times daily.   WELCHOL 625 MG tablet Generic drug:  colesevelam TAKE THREE TABLETS BY MOUTH TWICE DAILY WITH MEALS   zolpidem 5 MG tablet Commonly known as:  AMBIEN Take 1 tablet (5 mg total) by mouth at bedtime as needed for sleep.      Follow-up Information    Outpatient Nuclear Stress Test Follow up on 01/22/2018.   Why:  Please arrive 15 minutes early for your 7:45am outpatient stress test. Follow the directions on your discharge paperwork for instructions to follow the night before. Contact  information: [facility] [address] [place] [address]       Shirley Friar, PA-C Follow up on 01/29/2018.   Specialty:  Physician Assistant Why:  Please arrive 15 minutes early for your 9:30am appointment. You will review your stress test results at this visit Contact information: [place] [phone]        Lipid clinic appointment Follow up on 02/07/2018.   Why:  Please arrive 15 minutes early for your 9:30 am appointment. You will meet with a pharmacist to discuss options to better control  your cholesterol.  Contact information: [facility] [address] [place] [address]         Allergies  Allergen Reactions  . Cephalexin Swelling and Other (See Comments)    Tongue swelling  . Statins Other (See Comments)    Weak muscles  . Ciprofloxacin Rash     Procedures/Studies:    Dg Chest 2 View  Result Date: 01/15/2018 CLINICAL DATA:  Chest pain EXAM: CHEST - 2 VIEW COMPARISON:  05/18/2017 FINDINGS: Normal heart size. Aortic tortuosity. Left midlung linear opacity is stable and attributed to scarring. Similar faint linear densities at the bases. Large lung volumes with diaphragm flattening in the lateral view. There is no edema, consolidation, effusion, or pneumothorax. Venous stenting in the right upper extremity. IMPRESSION: No acute finding when compared to prior. Electronically Signed   By: Monte Fantasia M.D.   On: 01/15/2018 14:09     The results of significant diagnostics from this hospitalization (including imaging, microbiology, ancillary and laboratory) are listed below for reference.     Microbiology: No results found for this or any previous visit (from the past 240 hour(s)).   Labs: BNP (last 3 results) No results for input(s): BNP in the last 8760 hours. Basic Metabolic Panel: Recent Labs  Lab 01/15/18 1314  NA 138  K 4.8  CL 102  CO2 20*   GLUCOSE 203*  BUN 43*  CREATININE 10.06*  CALCIUM 8.4*   Liver Function Tests: No results for input(s): AST, ALT, ALKPHOS, BILITOT, PROT, ALBUMIN in the last 168 hours. No results for input(s): LIPASE, AMYLASE in the last 168 hours. No results for input(s): AMMONIA in the last 168 hours. CBC: Recent Labs  Lab 01/15/18 1314  WBC 7.7  HGB 11.4*  HCT 37.0*  MCV 100.5*  PLT 283   Cardiac Enzymes: Recent Labs  Lab 01/15/18 2038 01/16/18 0110 01/16/18 0657  TROPONINI 0.03* 0.03* <0.03   BNP: Invalid input(s): POCBNP CBG: Recent Labs  Lab 01/15/18 2343 01/16/18 1215  GLUCAP 134* 153*   D-Dimer No results for input(s): DDIMER in the last 72 hours. Hgb A1c No results for input(s): HGBA1C in the last 72 hours. Lipid Profile Recent Labs    01/16/18 0110  CHOL 278*  HDL 48  LDLCALC 196*  TRIG 169*  CHOLHDL 5.8   Thyroid function studies No results for input(s): TSH, T4TOTAL, T3FREE, THYROIDAB in the last 72 hours.  Invalid input(s): FREET3 Anemia work up No results for input(s): VITAMINB12, FOLATE, FERRITIN, TIBC, IRON, RETICCTPCT in the last 72 hours. Urinalysis    Component Value Date/Time   COLORURINE LT. YELLOW 06/26/2013 1200   APPEARANCEUR CLEAR 06/26/2013 1200   LABSPEC 1.020 06/26/2013 1200   PHURINE 8.5 06/26/2013 1200   GLUCOSEU 100 06/26/2013 1200   HGBUR MODERATE 06/26/2013 1200   BILIRUBINUR NEGATIVE 06/26/2013 1200   KETONESUR NEGATIVE 06/26/2013 1200   PROTEINUR >300 (A) 01/04/2010 1032   UROBILINOGEN 0.2 06/26/2013 1200   NITRITE NEGATIVE 06/26/2013 1200   LEUKOCYTESUR TRACE 06/26/2013 1200   Sepsis Labs Invalid input(s): PROCALCITONIN,  WBC,  LACTICIDVEN Microbiology No results found for this or any previous visit (from the past 240 hour(s)).   Time coordinating discharge: 50min  SIGNED:   Debbe Odea, MD  [facility] 01/16/2018, 2:39 PM Pager   If 7PM-7AM, please contact night-coverage [email or web address] Password  [email or web address]

## 2018-01-16 NOTE — Consult Note (Addendum)
Cardiology Consultation:   Patient ID: [name]; [id number]; 06-Apr-1951   Admit date: 01/15/2018 Date of Consult: 01/16/2018  Primary Care Provider: Biagio Borg, MD Primary Cardiologist: Dr. [name] Dr. Gwenlyn Found Primary Electrophysiologist:  Dr. Allegra Lai   Patient Profile:   [name] is a 67 y.o. male with a hx of CAD s/p multiple PCI and NSTEMIs, chronic combined CHF (EF 40-45%, G2DD on echo 10/2016), AVNRT s/p ablation, HLD, DM type 2, ESRD on HD T/Th/Sat, COPD on home O2 qHS, OSA, and prostate cancer s/p XRT who is being seen today for the evaluation of chest pain at the request of Dr. Wynelle Cleveland.  History of Present Illness:   [name] was in his usual state of health until after dialysis on Tuesday 01/14/18 when he experienced left-sided chest pressure while sitting in his car. He noted some associated nausea, but denied vomiting, radiation of pain, SOB, diaphoresis, dizziness, lightheadedness, or syncope. He states the pain lasted for 10-15 minutes and resolved spontaneously. He noted a second episode of chest pressure while laying in bed later that evening which again lasted 10-15 minutes without associated symptoms, and resolved spontaneously. On Wednesday morning, his wife called his cardiologist office and was advised to bring him to the ED for further evaluation. He states he did not have any chest pain on Wednesday and has not had a reoccurrence since arrival to the hospital. He is unsure if the pain he experienced on Tuesday was similar to prior MI (NSTEMI 2017).   He was last evaluated by by cardiology 01/01/18 and reported feeling overall okay with occasional chest pain lasting for seconds at a time and occurred 1x/mo. He was without volume overload complaints but did report SBPs as low as 60-70s despite midodrine. His BBlocker was discontinued at that visit. His last echocardiogram 10/2016 revealed EF 40-45%, G2DD, and no significant valvular abnormalities. His last  ischemic evaluation was a LHC 08/2016 with PCI/DES to LAD, and borderline mRCA lesion which was not amenable to intervention.   He is currently CP free and without complaints. He notes he becomes easily fatigued which he attributes to deconditioning. He denies exertional CP, DOE, SOB, orthopnea, PND, LE edmea, recent changes in weight, or syncope.   Hospital course: VSS. Labs notable for electrolytes wnl, Cr 10.06, Hgb 11.4, PLT 283, Trop 0.03 x2, LDL 196. EKG with NSR with PVC, minor ST abnormalities unchanged from previous, QTC 497. CXR without acute findings. Patient was admitted to medicine with Cardiology consulting for further recommendations.    Past Medical History:  Diagnosis Date  . Allergic rhinitis, cause unspecified 02/24/2014  . Anemia 06/16/2011  . BENIGN PROSTATIC HYPERTROPHY 10/14/2009  . CAD, NATIVE VESSEL 02/06/2009   a. 06/2007 s/p Taxus DES to the RCA;  b. 08/2016 NSTEMI in setting of SVT/PCI: LM 30ost, LAD 37m (3.0x16 Synergy DES), LCX 72m, OM1 60, RI 40, RCA 70p/m, 80m - not amenable to PCI.  [name] Cervical radiculopathy, chronic 02/23/2016   Right c5-6 by NCS/EMG  . CHEST PAIN 03/29/2010  . Chronic combined systolic (congestive) and diastolic (congestive) heart failure ([place])    a. 10/2016 Echo: EF 40-45%, Gr2 DD. mildly dil LA.  [name] COLONIC POLYPS, HX OF 10/14/2009  . Dementia [id number]  . DEPRESSION 10/14/2009  . Depression 09/24/2015  . DIABETES MELLITUS, TYPE II 02/01/2010  . DIZZINESS 07/17/2010  . DYSLIPIDEMIA 06/18/2007  . ESRD (end stage renal disease) on dialysis ([place]) 08/04/2010   "TTS;  " (04/18/2015)  . FOOT  PAIN 08/12/2008  . GAIT DISTURBANCE 03/03/2010  . GASTROENTERITIS, VIRAL 10/14/2009  . GERD 06/18/2007  . GOITER, MULTINODULAR 12/26/2007  . GOUT 06/18/2007  . GYNECOMASTIA 07/17/2010  . Hemodialysis access, fistula mature [facility])    Dialysis T-Th-Sa ([place]) Right upper arm fistula  . Hyperlipidemia 10/16/2011  . Hyperparathyroidism, secondary  ([place]) 06/16/2011  . HYPERTENSION 06/18/2007  . Hyperthyroidism   . Hypocalcemia 06/07/2010  . Ischemic cardiomyopathy    a. 10/2016 Echo: EF 40-45%.  . Lumbar stenosis with neurogenic claudication   . NECK PAIN 07/31/2010  . ONYCHOMYCOSIS, TOENAILS 12/26/2007  . OSA on CPAP 10/16/2011  . Other malaise and fatigue 11/24/2009  . PERIPHERAL NEUROPATHY 06/18/2007  . Prostate cancer ([place])   . PSVT (paroxysmal supraventricular tachycardia) ([place])    a. 61/4431 complicated by NSTEMI;  b. 11/2016 Treated w/ adenosine in ED;  c. 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  [name] PULMONARY NODULE, RIGHT LOWER LOBE 06/08/2009  . Sleep apnea    cpap machine and o2  . TRANSAMINASES, SERUM, ELEVATED 02/01/2010  . Transfusion history    none recent  . Unspecified hypotension 01/30/2010    Past Surgical History:  Procedure Laterality Date  . ARTERIOVENOUS GRAFT PLACEMENT Right 2009   forearm/notes 02/01/2011  . AV FISTULA PLACEMENT  11/07/2011   Procedure: INSERTION OF ARTERIOVENOUS (AV) GORE-TEX GRAFT ARM;  Surgeon: Tinnie Gens, MD;  Location: [place];  Service: Vascular;  Laterality: Left;  . BACK SURGERY  1998  . BASCILIC VEIN TRANSPOSITION Right 02/27/2013   Procedure: BASCILIC VEIN TRANSPOSITION;  Surgeon: Mal Misty, MD;  Location: [place];  Service: Vascular;  Laterality: Right;  Right Basilic Vein Transposition   . CARDIAC CATHETERIZATION N/A 08/06/2016   Procedure: Left Heart Cath and Coronary Angiography;  Surgeon: Jolaine Artist, MD;  Location: [place] CV LAB;  Service: Cardiovascular;  Laterality: N/A;  . CARDIAC CATHETERIZATION N/A 08/07/2016   Procedure: Coronary/Graft Atherectomy-CSI LAD;  Surgeon: Peter M Martinique, MD;  Location: [place] CV LAB;  Service: Cardiovascular;  Laterality: N/A;  . CERVICAL SPINE SURGERY  2/09   "to repair nerve problems in my left arm"  . CHOLECYSTECTOMY    . COLONOSCOPY WITH PROPOFOL N/A 04/26/2017   Procedure: COLONOSCOPY WITH PROPOFOL;  Surgeon: Otis Brace, MD;  Location:  [place];  Service: Gastroenterology;  Laterality: N/A;  . CORONARY ANGIOPLASTY WITH STENT PLACEMENT  06/11/2008  . CORONARY ANGIOPLASTY WITH STENT PLACEMENT  06/2007   TAXUS stent to RCA/notes 01/31/2011  . ESOPHAGOGASTRODUODENOSCOPY  09/28/2011   Procedure: ESOPHAGOGASTRODUODENOSCOPY (EGD);  Surgeon: Missy Sabins, MD;  Location: [facility] ENDOSCOPY;  Service: Endoscopy;  Laterality: N/A;  . ESOPHAGOGASTRODUODENOSCOPY N/A 04/07/2015   Procedure: ESOPHAGOGASTRODUODENOSCOPY (EGD);  Surgeon: Teena Irani, MD;  Location: [facility];  Service: Endoscopy;  Laterality: N/A;  . ESOPHAGOGASTRODUODENOSCOPY N/A 04/19/2015   Procedure: ESOPHAGOGASTRODUODENOSCOPY (EGD);  Surgeon: Arta Silence, MD;  Location: [facility] ENDOSCOPY;  Service: Endoscopy;  Laterality: N/A;  . FLEXIBLE SIGMOIDOSCOPY N/A 05/21/2017   Procedure: Conni Elliot;  Surgeon: Clarene Essex, MD;  Location: [place];  Service: Endoscopy;  Laterality: N/A;  . FLEXIBLE SIGMOIDOSCOPY Left 07/02/2017   Procedure: FLEXIBLE SIGMOIDOSCOPY;  Surgeon: Laurence Spates, MD;  Location: [place];  Service: Endoscopy;  Laterality: Left;  . FOREIGN BODY REMOVAL  09/2003   via upper endoscopy/notes 02/12/2011  . GIVENS CAPSULE STUDY  09/30/2011   Procedure: GIVENS CAPSULE STUDY;  Surgeon: Jeryl Columbia, MD;  Location: [facility] ENDOSCOPY;  Service: Endoscopy;  Laterality: N/A;  .  INSERTION OF DIALYSIS CATHETER Right 2014  . INSERTION OF DIALYSIS CATHETER Left 02/11/2013   Procedure: INSERTION OF DIALYSIS CATHETER;  Surgeon: Conrad Gu-Win, MD;  Location: [place];  Service: Vascular;  Laterality: Left;  Ultrasound guided  . LUMBAR LAMINECTOMY/DECOMPRESSION MICRODISCECTOMY Bilateral 07/31/2017   Procedure: LAMINECTOMY AND FORAMINOTOMY- BILATERAL LUMBAR TWO- LUMBAR THREE;  Surgeon: Earnie Larsson, MD;  Location: [place];  Service: Neurosurgery;  Laterality: Bilateral;  LAMINECTOMY AND FORAMINOTOMY- BILATERAL LUMBAR 2- LUMBAR 3  . REMOVAL OF A DIALYSIS CATHETER Right  02/11/2013   Procedure: REMOVAL OF A DIALYSIS CATHETER;  Surgeon: Conrad Cherry Creek, MD;  Location: [place];  Service: Vascular;  Laterality: Right;  . SAVORY DILATION N/A 04/07/2015   Procedure: SAVORY DILATION;  Surgeon: Teena Irani, MD;  Location: [facility] ENDOSCOPY;  Service: Endoscopy;  Laterality: N/A;  . SHUNTOGRAM N/A 09/20/2011   Procedure: Earney Mallet;  Surgeon: Conrad Verona Walk, MD;  Location: [facility] CATH LAB;  Service: Cardiovascular;  Laterality: N/A;  . SVT ABLATION N/A 11/26/2016   Procedure: SVT Ablation;  Surgeon: Will Meredith Leeds, MD;  Location: [place] CV LAB;  Service: Cardiovascular;  Laterality: N/A;  . TONSILLECTOMY    . TOTAL KNEE ARTHROPLASTY Right 08/02/2015   Procedure: TOTAL KNEE ARTHROPLASTY;  Surgeon: Renette Butters, MD;  Location: [place];  Service: Orthopedics;  Laterality: Right;  . VENOGRAM N/A 01/26/2013   Procedure: VENOGRAM;  Surgeon: Angelia Mould, MD;  Location: [facility] CATH LAB;  Service: Cardiovascular;  Laterality: N/A;     Home Medications:  Prior to Admission medications   Medication Sig Start Date End Date Taking? Authorizing Provider  acetaminophen (TYLENOL) 325 MG tablet Take 650 mg by mouth every 6 (six) hours as needed for mild pain or headache.    Yes [provider]  allopurinol (ZYLOPRIM) 100 MG tablet TAKE ONE TABLET BY MOUTH ONCE DAILY Patient taking differently: TAKE ONE TABLET BY MOUTH ONCE in the evening 10/14/17  Yes Biagio Borg, MD  aspirin EC 81 MG tablet Take 81 mg by mouth daily.   Yes [provider]  citalopram (CELEXA) 10 MG tablet Take 1 tablet (10 mg total) by mouth at bedtime. 10/14/17  Yes Biagio Borg, MD  ezetimibe (ZETIA) 10 MG tablet TAKE ONE TABLET BY MOUTH ONCE DAILY 04/08/17  Yes Bensimhon, Shaune Pascal, MD  ferric citrate (AURYXIA) 1 GM 210 MG(Fe) tablet Take 420 mg by mouth 3 (three) times daily with meals.   Yes [provider]  gabapentin (NEURONTIN) 300 MG capsule Take 1 capsule (300 mg total) by mouth at  bedtime. Patient taking differently: Take 600 mg by mouth at bedtime.  07/05/17  Yes Domenic Polite, MD  hydroxypropyl methylcellulose / hypromellose (ISOPTO TEARS / GONIOVISC) 2.5 % ophthalmic solution Place 1 drop into both eyes 3 (three) times daily as needed for dry eyes. 05/03/17  Yes Biagio Borg, MD  lanthanum (FOSRENOL) 1000 MG chewable tablet Chew 1,000 mg by mouth 2 (two) times daily.   Yes [provider]  midodrine (PROAMATINE) 10 MG tablet Take 1 tablet (10 mg total) by mouth See admin instructions. Once a day only on dialysis days (Tues/Thurs/Sat) 10/18/16  Yes Regalado, Belkys A, MD  multivitamin (RENA-VIT) TABS tablet Take 1 tablet by mouth daily. Reported on 10/24/2015   Yes [provider]  pantoprazole (PROTONIX) 40 MG tablet Take 40 mg by mouth every evening.  08/31/16  Yes [provider]  polyethylene glycol (MIRALAX / GLYCOLAX) packet Take 17 g  by mouth daily. Patient taking differently: Take 17 g by mouth daily as needed for mild constipation.  05/23/17  Yes Florencia Reasons, MD  SENSIPAR 90 MG tablet Take 90 mg by mouth 2 (two) times daily. 09/18/16  Yes [provider]  WELCHOL 625 MG tablet TAKE THREE TABLETS BY MOUTH TWICE DAILY WITH MEALS 10/04/17  Yes Biagio Borg, MD  zolpidem (AMBIEN) 5 MG tablet Take 1 tablet (5 mg total) by mouth at bedtime as needed for sleep. 10/14/17  Yes Biagio Borg, MD    Inpatient Medications: Scheduled Meds: . allopurinol  100 mg Oral Daily  . aspirin EC  81 mg Oral Daily  . cinacalcet  90 mg Oral BID WC  . citalopram  10 mg Oral QHS  . colesevelam  625 mg Oral BID WC  . ezetimibe  10 mg Oral Daily  . gabapentin  600 mg Oral QHS  . heparin  5,000 Units Subcutaneous Q8H  . insulin aspart  0-5 Units Subcutaneous QHS  . insulin aspart  0-9 Units Subcutaneous TID WC  . lanthanum  1,000 mg Oral BID WC  . midodrine  10 mg Oral Q T,Th,Sat-1800  . multivitamin  1 tablet Oral Daily  . pantoprazole  40 mg Oral QPM    Continuous Infusions:  PRN Meds: acetaminophen, cyclobenzaprine, hydrALAZINE, hydroxypropyl methylcellulose / hypromellose, nitroGLYCERIN, ondansetron (ZOFRAN) IV, oxyCODONE, polyethylene glycol, zolpidem  Allergies:    Allergies  Allergen Reactions  . Cephalexin Swelling and Other (See Comments)    Tongue swelling  . Statins Other (See Comments)    Weak muscles  . Ciprofloxacin Rash    Social History:   Social History   Socioeconomic History  . Marital status: Married    Spouse name: Not on file  . Number of children: 3  . Years of education: 56  . Highest education level: Not on file  Occupational History  . Occupation: disabled    Employer: DISABLED  . Occupation: formerly Lawyer for [employer].  Social Needs  . Financial resource strain: Not on file  . Food insecurity:    Worry: Not on file    Inability: Not on file  . Transportation needs:    Medical: Not on file    Non-medical: Not on file  Tobacco Use  . Smoking status: Former Smoker    Packs/day: 1.00    Years: 25.00    Pack years: 25.00    Types: Cigarettes    Last attempt to quit: 10/01/2005    Years since quitting: 12.3  . Smokeless tobacco: Former Systems developer    Quit date: 10/01/2005  . Tobacco comment: Quit smoking 2007 Smoked x 25 years 1/2 ppd.  Substance and Sexual Activity  . Alcohol use: No    Alcohol/week: 0.0 oz  . Drug use: No  . Sexual activity: Never  Lifestyle  . Physical activity:    Days per week: Not on file    Minutes per session: Not on file  . Stress: Not on file  Relationships  . Social connections:    Talks on phone: Not on file    Gets together: Not on file    Attends religious service: Not on file    Active member of club or organization: Not on file    Attends meetings of clubs or organizations: Not on file    Relationship status: Not on file  . Intimate partner violence:    Fear of current or ex partner: Not on file  Emotionally abused: Not on file     Physically abused: Not on file    Forced sexual activity: Not on file  Other Topics Concern  . Not on file  Social History Narrative   Lives with wife   Caffeine use: Tea sometimes   No coffee   Left handed     Family History:    Family History  Problem Relation Age of Onset  . Heart disease Sister   . Thyroid nodules Sister   . Heart disease Father   . Diabetes Father   . Kidney failure Father   . Hypertension Father   . Healthy Child   . Healthy Child   . Healthy Child   . Cancer Neg Hx      ROS:  Please see the history of present illness.   All other ROS reviewed and negative.     Physical Exam/Data:   Vitals:   01/15/18 2035 01/16/18 0000 01/16/18 0042 01/16/18 0119  BP: 132/80 130/70  117/79  Pulse: 76   72  Resp: 13 17  18   Temp:    98.7 F (37.1 C)  TempSrc:    Oral  SpO2: 98%   100%  Weight:   221 lb 14.4 oz (100.7 kg)   Height:   6\' 1"  (1.854 m)     Intake/Output Summary (Last 24 hours) at 01/16/2018 0748 Last data filed at 01/16/2018 0043 Gross per 24 hour  Intake 0 ml  Output -  Net 0 ml   Filed Weights   01/15/18 1312 01/16/18 0042  Weight: 228 lb (103.4 kg) 221 lb 14.4 oz (100.7 kg)   Body mass index is 29.28 kg/m.  General:  Well nourished, well developed, laying in bed in no acute distress HEENT: sclera anicteric, MMM Neck: unable to assess JVD due to facial hair Vascular: No carotid bruits; distal pulses 2+ bilaterally; fistula in LUE with palpable thrill Cardiac:  normal S1, S2; RRR; no murmurs, gallops, or rubs Lungs:  clear to auscultation bilaterally, no wheezing, rhonchi or rales  Abd: NABS, soft, nontender, no hepatomegaly Ext: no edema Musculoskeletal:  No deformities, BUE and BLE strength normal and equal Skin: warm and dry  Neuro:  CNs 2-12 intact, no focal abnormalities noted Psych:  Normal affect   EKG:  The EKG was personally reviewed and demonstrates:   NSR with PVC, minor ST abnormalities unchanged from previous, QTC  497 Telemetry:  Telemetry was personally reviewed and demonstrates:  NSR with occasional PVCs  Relevant CV Studies:  Echocardiogram 10/2016: Study Conclusions  - Left ventricle: The cavity size was normal. Systolic function was   mildly to moderately reduced. The estimated ejection fraction was   in the range of 40% to 45%. Wall motion was normal; there were no   regional wall motion abnormalities. Features are consistent with   a pseudonormal left ventricular filling pattern, with concomitant   abnormal relaxation and increased filling pressure (grade 2   diastolic dysfunction). Doppler parameters are consistent with   elevated ventricular end-diastolic filling pressure. - Aortic valve: There was no regurgitation. - Aortic root: The aortic root was normal in size. - Mitral valve: There was trivial regurgitation. - Left atrium: The atrium was mildly dilated. - Right ventricle: Systolic function was normal. - Right atrium: The atrium was normal in size. - Pulmonary arteries: Systolic pressure was within the normal   range. - Inferior vena cava: The vessel was normal in size. - Pericardium, extracardiac: There was no pericardial effusion.  Impressions:  - Compared to the prior study on 01/12/2015 LVEF decreased,   previously 55%, now 40-45%, there is basal inferior wall   hypokinesis.  LHC 08/2016  Mid LAD lesion, 90 %stenosed.  Ost LM to LM lesion, 30 %stenosed.  Prox Cx to Mid Cx lesion, 40 %stenosed.  1st Mrg lesion, 60 %stenosed.  Ramus lesion, 40 %stenosed.  Prox RCA to Mid RCA lesion, 70 %stenosed.  Mid RCA lesion, 80 %stenosed.  Dist RCA lesion, 0 %stenosed.   Assessment:  1. Heavily calcified coronary arteries 2. High grade lesion in midLAD (90%) and borderline lesion midRCA (70-80%) 3. LVEDP 17  Plan:  Will review with interventional team for possible PCI of LAD +/- RCA.  F/U STAGED INTERVENTION  A STENT SYNERGY DES 3X16 drug eluting stent was  successfully placed.  Mid LAD lesion, 90 %stenosed.  Post intervention, there is a 0% residual stenosis.   1. Successful orbital atherectomy and stenting of the mid LAD with DES  Plan: DAPT for one year. Anticipate DC tomorrow.   Laboratory Data:  Chemistry Recent Labs  Lab 01/15/18 1314  NA 138  K 4.8  CL 102  CO2 20*  GLUCOSE 203*  BUN 43*  CREATININE 10.06*  CALCIUM 8.4*  GFRNONAA 5*  GFRAA 5*  ANIONGAP 16*    No results for input(s): PROT, ALBUMIN, AST, ALT, ALKPHOS, BILITOT in the last 168 hours. Hematology Recent Labs  Lab 01/15/18 1314  WBC 7.7  RBC 3.68*  HGB 11.4*  HCT 37.0*  MCV 100.5*  MCH 31.0  MCHC 30.8  RDW 17.1*  PLT 283   Cardiac Enzymes Recent Labs  Lab 01/15/18 2038 01/16/18 0110  TROPONINI 0.03* 0.03*    Recent Labs  Lab 01/15/18 1432 01/15/18 1832  TROPIPOC 0.03 0.04    BNPNo results for input(s): BNP, PROBNP in the last 168 hours.  DDimer No results for input(s): DDIMER in the last 168 hours.  Radiology/Studies:  Dg Chest 2 View  Result Date: 01/15/2018 CLINICAL DATA:  Chest pain EXAM: CHEST - 2 VIEW COMPARISON:  05/18/2017 FINDINGS: Normal heart size. Aortic tortuosity. Left midlung linear opacity is stable and attributed to scarring. Similar faint linear densities at the bases. Large lung volumes with diaphragm flattening in the lateral view. There is no edema, consolidation, effusion, or pneumothorax. Venous stenting in the right upper extremity. IMPRESSION: No acute finding when compared to prior. Electronically Signed   By: Monte Fantasia M.D.   On: 01/15/2018 14:09    Assessment and Plan:   1. Chest pain in patient with CAD: atypical chest pain which occurred at rest without associated symptoms, lasting for 10-15 min, and resolving spontaneously. Trop 0.03 x2. EKG without ischemic changes. CXR without acute findings. Patient with known residual CAD in RCA not amenable to intervention; s/p DES to RCA 2008 and LAD 08/2016.  Last echo with EF 40-45%, G2DD, and no significant valvular abnormalities. Minimal troponin elevation not consistent with ACS, however he does have known residual disease which could be contributing to symptoms.  - Given negative trops and no recurrence of pain, can pursue outpatient ischemic evaluation.  - Continue ASA, zetia, and welchol  2. Chronic combined CHF: Appears euvolemic on exam. Currently at dry weight and without overload complaints. Last echo with EF 40-45%, G2DD, and no significant valvular abnormalities.  - Not on BBlocker due to hypotension - Not on ACEi/ARB due to ESRD - Echocardiogram pending  3. DM type 2: last A1C 6.2 12/2017; at goal of <  7 - Continue current regimen  4. HLD: poorly controlled with LDL 196; goal <7. Intolerant to statins. - Continue zetia and welchol - Consider referral to lipid clinic for consideration of PSK-9 inhibitor  5. ESRD on HD: T/Th/Sat schedule; at dry weight - Continue HD as previously scheduled  6. Prolonged QT: has been noted on multiple EKGs in the past - Avoid QT prolonging medications  7. OSA: compliant with CPAP - Continue current management  For questions or updates, please contact [place] Please consult [email or web address] for contact info under Cardiology/STEMI.   Signed, Abigail Butts, PA-C  01/16/2018 7:48 AM [phone]   History and all data above reviewed.  Patient examined.  I agree with the findings as above.  The patient had chest pain at rest after dialysis while sitting in his car.  This was moderate and left of his sternum.  He was not sure whether has had this pain before. It came and went spontaneously.  He has otherwise not had pain recently.  He is sedentary.  He cannot bring on this pain with his mild activity.  He says that he has had no other pain since then.  Enzymes are not diagnostic of acute coronary event.  EKG was not acute.  There is prolonged QT.  The patient exam reveals COR:RRR  ,  Lungs:  Clear  ,  Abd: Positive bowel sounds, no rebound no guarding, Ext   No edema  .  All available labs, radiology testing, previous records reviewed. Agree with documented assessment and plan. Chest pain:  OK to go home and we will arrange an out patient Lexiscan Myoview.  Cancel echo.  Long QT:  No symptoms with this and she needs to avoid QT prolonging drugs.  Dyslipidemia:  Plan to send him to Lipid Clinic to consider PCSK9.  Start Zetia.    Oracio Volanda Mangine  9:40 AM  01/16/2018

## 2018-01-17 ENCOUNTER — Telehealth (HOSPITAL_COMMUNITY): Payer: Self-pay

## 2018-01-17 NOTE — Telephone Encounter (Signed)
Encounter complete. 

## 2018-01-18 DIAGNOSIS — E119 Type 2 diabetes mellitus without complications: Secondary | ICD-10-CM | POA: Diagnosis not present

## 2018-01-18 DIAGNOSIS — N2581 Secondary hyperparathyroidism of renal origin: Secondary | ICD-10-CM | POA: Diagnosis not present

## 2018-01-18 DIAGNOSIS — D631 Anemia in chronic kidney disease: Secondary | ICD-10-CM | POA: Diagnosis not present

## 2018-01-18 DIAGNOSIS — N186 End stage renal disease: Secondary | ICD-10-CM | POA: Diagnosis not present

## 2018-01-20 ENCOUNTER — Encounter: Payer: Self-pay | Admitting: Pharmacy Technician

## 2018-01-21 DIAGNOSIS — D631 Anemia in chronic kidney disease: Secondary | ICD-10-CM | POA: Diagnosis not present

## 2018-01-21 DIAGNOSIS — N2581 Secondary hyperparathyroidism of renal origin: Secondary | ICD-10-CM | POA: Diagnosis not present

## 2018-01-21 DIAGNOSIS — E119 Type 2 diabetes mellitus without complications: Secondary | ICD-10-CM | POA: Diagnosis not present

## 2018-01-21 DIAGNOSIS — N186 End stage renal disease: Secondary | ICD-10-CM | POA: Diagnosis not present

## 2018-01-22 ENCOUNTER — Ambulatory Visit (HOSPITAL_COMMUNITY)
Admit: 2018-01-22 | Discharge: 2018-01-22 | Disposition: A | Discharge status: Home / Self Care | Payer: Medicare Other | Attending: Internal Medicine | Admitting: Internal Medicine

## 2018-01-22 DIAGNOSIS — R079 Chest pain, unspecified: Secondary | ICD-10-CM

## 2018-01-22 DIAGNOSIS — R5383 Other fatigue: Secondary | ICD-10-CM | POA: Insufficient documentation

## 2018-01-22 DIAGNOSIS — Z87891 Personal history of nicotine dependence: Secondary | ICD-10-CM | POA: Insufficient documentation

## 2018-01-22 DIAGNOSIS — E119 Type 2 diabetes mellitus without complications: Secondary | ICD-10-CM | POA: Diagnosis not present

## 2018-01-22 DIAGNOSIS — I25119 Atherosclerotic heart disease of native coronary artery with unspecified angina pectoris: Secondary | ICD-10-CM | POA: Insufficient documentation

## 2018-01-22 DIAGNOSIS — Z8249 Family history of ischemic heart disease and other diseases of the circulatory system: Secondary | ICD-10-CM | POA: Insufficient documentation

## 2018-01-22 LAB — MYOCARDIAL PERFUSION IMAGING
CHL CUP NUCLEAR SDS: 1
CHL CUP NUCLEAR SRS: 2
CHL CUP NUCLEAR SSS: 3
CSEPPHR: 97 {beats}/min
LV dias vol: 115 mL (ref 62–150)
LV sys vol: 65 mL
Rest HR: 82 {beats}/min
TID: 0.87

## 2018-01-22 MED ORDER — TECHNETIUM TC 99M TETROFOSMIN IV KIT
28.3000 | PACK | Freq: Once | INTRAVENOUS | Status: AC | PRN
Start: 2018-01-22 — End: 2018-01-22
  Administered 2018-01-22: 28.3 via INTRAVENOUS
  Filled 2018-01-22: qty 29

## 2018-01-22 MED ORDER — TECHNETIUM TC 99M TETROFOSMIN IV KIT
10.4000 | PACK | Freq: Once | INTRAVENOUS | Status: AC | PRN
Start: 2018-01-22 — End: 2018-01-22
  Administered 2018-01-22: 10.4 via INTRAVENOUS
  Filled 2018-01-22: qty 11

## 2018-01-22 MED ORDER — REGADENOSON 0.4 MG/5ML IV SOLN
0.4000 mg | Freq: Once | INTRAVENOUS | Status: AC
  Administered 2018-01-22: 0.4 mg via INTRAVENOUS

## 2018-01-23 DIAGNOSIS — N2581 Secondary hyperparathyroidism of renal origin: Secondary | ICD-10-CM | POA: Diagnosis not present

## 2018-01-23 DIAGNOSIS — E119 Type 2 diabetes mellitus without complications: Secondary | ICD-10-CM | POA: Diagnosis not present

## 2018-01-23 DIAGNOSIS — D631 Anemia in chronic kidney disease: Secondary | ICD-10-CM | POA: Diagnosis not present

## 2018-01-23 DIAGNOSIS — N186 End stage renal disease: Secondary | ICD-10-CM | POA: Diagnosis not present

## 2018-01-24 DIAGNOSIS — C61 Malignant neoplasm of prostate: Secondary | ICD-10-CM | POA: Diagnosis not present

## 2018-01-24 DIAGNOSIS — L308 Other specified dermatitis: Secondary | ICD-10-CM | POA: Diagnosis not present

## 2018-01-25 DIAGNOSIS — D631 Anemia in chronic kidney disease: Secondary | ICD-10-CM | POA: Diagnosis not present

## 2018-01-25 DIAGNOSIS — N186 End stage renal disease: Secondary | ICD-10-CM | POA: Diagnosis not present

## 2018-01-25 DIAGNOSIS — N2581 Secondary hyperparathyroidism of renal origin: Secondary | ICD-10-CM | POA: Diagnosis not present

## 2018-01-25 DIAGNOSIS — E119 Type 2 diabetes mellitus without complications: Secondary | ICD-10-CM | POA: Diagnosis not present

## 2018-01-28 DIAGNOSIS — N186 End stage renal disease: Secondary | ICD-10-CM | POA: Diagnosis not present

## 2018-01-28 DIAGNOSIS — N2581 Secondary hyperparathyroidism of renal origin: Secondary | ICD-10-CM | POA: Diagnosis not present

## 2018-01-28 DIAGNOSIS — E119 Type 2 diabetes mellitus without complications: Secondary | ICD-10-CM | POA: Diagnosis not present

## 2018-01-28 DIAGNOSIS — D631 Anemia in chronic kidney disease: Secondary | ICD-10-CM | POA: Diagnosis not present

## 2018-01-29 ENCOUNTER — Encounter (HOSPITAL_COMMUNITY): Payer: Self-pay

## 2018-01-29 ENCOUNTER — Ambulatory Visit (HOSPITAL_COMMUNITY)
Admission: RE | Admit: 2018-01-29 | Discharge: 2018-01-29 | Disposition: A | Discharge status: Home / Self Care | Payer: Medicare Other | Source: Ambulatory Visit | Attending: Internal Medicine | Admitting: Internal Medicine

## 2018-01-29 VITALS — BP 154/86 | HR 94 | Wt 227.0 lb

## 2018-01-29 DIAGNOSIS — Z79899 Other long term (current) drug therapy: Secondary | ICD-10-CM | POA: Diagnosis not present

## 2018-01-29 DIAGNOSIS — I428 Other cardiomyopathies: Secondary | ICD-10-CM | POA: Diagnosis not present

## 2018-01-29 DIAGNOSIS — I2510 Atherosclerotic heart disease of native coronary artery without angina pectoris: Secondary | ICD-10-CM | POA: Diagnosis not present

## 2018-01-29 DIAGNOSIS — I5022 Chronic systolic (congestive) heart failure: Secondary | ICD-10-CM | POA: Diagnosis not present

## 2018-01-29 DIAGNOSIS — G4733 Obstructive sleep apnea (adult) (pediatric): Secondary | ICD-10-CM | POA: Diagnosis not present

## 2018-01-29 DIAGNOSIS — E1122 Type 2 diabetes mellitus with diabetic chronic kidney disease: Secondary | ICD-10-CM | POA: Diagnosis not present

## 2018-01-29 DIAGNOSIS — H25813 Combined forms of age-related cataract, bilateral: Secondary | ICD-10-CM | POA: Diagnosis not present

## 2018-01-29 DIAGNOSIS — R5381 Other malaise: Secondary | ICD-10-CM

## 2018-01-29 DIAGNOSIS — N186 End stage renal disease: Secondary | ICD-10-CM | POA: Insufficient documentation

## 2018-01-29 DIAGNOSIS — Z7982 Long term (current) use of aspirin: Secondary | ICD-10-CM | POA: Insufficient documentation

## 2018-01-29 DIAGNOSIS — Z8546 Personal history of malignant neoplasm of prostate: Secondary | ICD-10-CM | POA: Diagnosis not present

## 2018-01-29 DIAGNOSIS — H04123 Dry eye syndrome of bilateral lacrimal glands: Secondary | ICD-10-CM | POA: Diagnosis not present

## 2018-01-29 DIAGNOSIS — R079 Chest pain, unspecified: Secondary | ICD-10-CM

## 2018-01-29 DIAGNOSIS — N2581 Secondary hyperparathyroidism of renal origin: Secondary | ICD-10-CM | POA: Diagnosis not present

## 2018-01-29 DIAGNOSIS — Z992 Dependence on renal dialysis: Secondary | ICD-10-CM | POA: Diagnosis not present

## 2018-01-29 DIAGNOSIS — E119 Type 2 diabetes mellitus without complications: Secondary | ICD-10-CM | POA: Diagnosis not present

## 2018-01-29 DIAGNOSIS — D631 Anemia in chronic kidney disease: Secondary | ICD-10-CM | POA: Diagnosis not present

## 2018-01-29 DIAGNOSIS — I132 Hypertensive heart and chronic kidney disease with heart failure and with stage 5 chronic kidney disease, or end stage renal disease: Secondary | ICD-10-CM | POA: Diagnosis not present

## 2018-01-29 DIAGNOSIS — E1129 Type 2 diabetes mellitus with other diabetic kidney complication: Secondary | ICD-10-CM | POA: Diagnosis not present

## 2018-01-29 LAB — HM DIABETES EYE EXAM

## 2018-01-29 NOTE — Patient Instructions (Signed)
Referral placed for outpatient physical therapy at [facility], they will contact you to schedule initial visit.  Follow up with Dr. [name] in 6 months.  Please call us in October to schedule appointment.   [phone], Option 3.

## 2018-01-29 NOTE — Progress Notes (Signed)
Advanced Heart Failure Clinic Note   Patient ID: [name], male   DOB: 12-15-1950, 67 y.o.   MRN: [id number]  PCP: Dr [name] Nephrologist: Dr Mercy Moore HF: Dr. [name]   HPI: [name] is a 67 y.o. male with PMH: obesity, DM, COPD on nighttime oxygen, sleep apnea on CPAP, ESRD- HD, CAD, S/P Taxus drug-eluting stent to the right coronary in September 5284, chronic systolic/diastolic heart failure (Unable to tolerate statins so placed on Welchol), prostate CA s/p XRT   Evaluated at [facility] for kidney transplant but he was not felt to be candidate (11/2012) due to poor mobility, DM, and coronary disease.    In 11/17 had NSTEMI underwent PCI LAD. RCA had long 80-90% but felt to be not amenable to PCI due to tortuosity. Admitted in 1/18 with SVT and small NSTEMI. Cath films reviewed again with Dr. Martinique and RCA not approachable percutaneously.   Admitted 12/27/16 after transient chest pain and troponin came back mildly elevated in office. Troponin normal in ED here and thought stable for home. Pt thought he actually may have had a panic attack.   Admitted 10/18 with rectal bleeding felt due to severe radiation proctitis with ulcer on flex sig. Plavix stopped. Remains on ASA 81.   Admitted 01/15/18 with sharp chest pain and left-sided chest pressure after dialysis. Trop negative. Scheduled for Myoview which was unremarkable/low risk as below.   He presents today for follow up. Overall doing well from HF perspective. He denies swelling or SOB.  He is mostly limited from fatigued. Was trying to work in his garden this weekend and family had to help him back in.  He states he gets bad hand and feet cramps after dialysis. He denies PND or Orthopnea. On HD Tues/Thur/Sat.  His SBP remains very labile.  Walks with a walker. Appetite stable.  He finished PT in February, and feels like he has declined since.   Myoview 01/22/18: EF 44%, No ischemia or infarction.   Echo 12/12: EF 55% Myoview 10/15/12:  EF 33%. LV Wall Motion: There is global hypokinesis. The LV is markedly enlarged. Small fixed apical defect.  cMRI 02/19/13: EF 37% dilated LV. diffuse HK Echo 6/15: EF 25-30%  Echo 4/16: EF 50-55% Echo 10/17/16 LVEF 40-45%, Grade 2 DD. Trivial MR, Mild LAE, Normal RV.  Review of systems complete and found to be negative unless listed in HPI.    Past Medical History:  Diagnosis Date  . Allergic rhinitis, cause unspecified 02/24/2014  . Anemia 06/16/2011  . BENIGN PROSTATIC HYPERTROPHY 10/14/2009  . CAD, NATIVE VESSEL 02/06/2009   a. 06/2007 s/p Taxus DES to the RCA;  b. 08/2016 NSTEMI in setting of SVT/PCI: LM 30ost, LAD 56m (3.0x16 Synergy DES), LCX 25m, OM1 60, RI 40, RCA 70p/m, 80m - not amenable to PCI.  [name] Cervical radiculopathy, chronic 02/23/2016   Right c5-6 by NCS/EMG  . Chronic combined systolic (congestive) and diastolic (congestive) heart failure ([place])    a. 10/2016 Echo: EF 40-45%, Gr2 DD. mildly dil LA.  [name] COLONIC POLYPS, HX OF 10/14/2009  . Dementia [id number]  . Depression 09/24/2015  . DIABETES MELLITUS, TYPE II 02/01/2010  . DYSLIPIDEMIA 06/18/2007  . ESRD (end stage renal disease) on dialysis ([place]) 08/04/2010   "TTS;  " (04/18/2015)  . FOOT PAIN 08/12/2008  . GAIT DISTURBANCE 03/03/2010  . GASTROENTERITIS, VIRAL 10/14/2009  . GERD 06/18/2007  . GOITER, MULTINODULAR 12/26/2007  . GOUT 06/18/2007  . GYNECOMASTIA 07/17/2010  . Hemodialysis access, fistula  mature [facility])    Dialysis T-Th-Sa ([place]) Right upper arm fistula  . Hyperlipidemia 10/16/2011  . Hyperparathyroidism, secondary ([place]) 06/16/2011  . HYPERTENSION 06/18/2007  . Hyperthyroidism   . Hypocalcemia 06/07/2010  . Ischemic cardiomyopathy    a. 10/2016 Echo: EF 40-45%.  . Lumbar stenosis with neurogenic claudication   . ONYCHOMYCOSIS, TOENAILS 12/26/2007  . OSA on CPAP 10/16/2011  . Other malaise and fatigue 11/24/2009  . PERIPHERAL NEUROPATHY 06/18/2007  . Prostate cancer ([place])   . PSVT (paroxysmal  supraventricular tachycardia) ([place])    a. 99/2426 complicated by NSTEMI;  b. 11/2016 Treated w/ adenosine in ED;  c. 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  [name] PULMONARY NODULE, RIGHT LOWER LOBE 06/08/2009  . Sleep apnea    cpap machine and o2  . TRANSAMINASES, SERUM, ELEVATED 02/01/2010  . Transfusion history    none recent  . Unspecified hypotension 01/30/2010   Current Outpatient Medications  Medication Sig Dispense Refill  . acetaminophen (TYLENOL) 325 MG tablet Take 650 mg by mouth every 6 (six) hours as needed for mild pain or headache.     . allopurinol (ZYLOPRIM) 100 MG tablet TAKE ONE TABLET BY MOUTH ONCE DAILY (Patient taking differently: TAKE ONE TABLET BY MOUTH ONCE in the evening) 90 tablet 3  . aspirin EC 81 MG tablet Take 81 mg by mouth daily.    . citalopram (CELEXA) 10 MG tablet Take 1 tablet (10 mg total) by mouth at bedtime. 90 tablet 3  . ezetimibe (ZETIA) 10 MG tablet TAKE ONE TABLET BY MOUTH ONCE DAILY 90 tablet 3  . ferric citrate (AURYXIA) 1 GM 210 MG(Fe) tablet Take 420 mg by mouth 3 (three) times daily with meals.    . gabapentin (NEURONTIN) 300 MG capsule Take 1 capsule (300 mg total) by mouth at bedtime. (Patient taking differently: Take 600 mg by mouth at bedtime. )    . hydroxypropyl methylcellulose / hypromellose (ISOPTO TEARS / GONIOVISC) 2.5 % ophthalmic solution Place 1 drop into both eyes 3 (three) times daily as needed for dry eyes. 15 mL 11  . lanthanum (FOSRENOL) 1000 MG chewable tablet Chew 1,000 mg by mouth 2 (two) times daily.    . midodrine (PROAMATINE) 10 MG tablet Take 1 tablet (10 mg total) by mouth See admin instructions. Once a day only on dialysis days (Tues/Thurs/Sat) 30 tablet 0  . multivitamin (RENA-VIT) TABS tablet Take 1 tablet by mouth daily. Reported on 10/24/2015    . pantoprazole (PROTONIX) 40 MG tablet Take 40 mg by mouth every evening.     . polyethylene glycol (MIRALAX / GLYCOLAX) packet Take 17 g by mouth daily. (Patient taking differently: Take 17 g by  mouth daily as needed for mild constipation. ) 14 each 0  . SENSIPAR 90 MG tablet Take 90 mg by mouth 2 (two) times daily.    Earnestine Mealing 625 MG tablet TAKE THREE TABLETS BY MOUTH TWICE DAILY WITH MEALS 180 tablet 11  . zolpidem (AMBIEN) 5 MG tablet Take 1 tablet (5 mg total) by mouth at bedtime as needed for sleep. 90 tablet 1   No current facility-administered medications for this visit.    Vitals:   01/29/18 0944  BP: (!) 162/86  Pulse: 94  SpO2: 98%   Wt Readings from Last 3 Encounters:  01/29/18 227 lb (103 kg)  01/22/18 221 lb (100.2 kg)  01/16/18 221 lb 9 oz (100.5 kg)    PHYSICAL EXAM: General: Well appearing. No resp difficulty. HEENT: Normal  Neck: Supple. JVP 5-6. Carotids 2+ bilat; no bruits. No thyromegaly or nodule noted. Cor: PMI nondisplaced. RRR, No M/G/R noted Lungs: CTAB, normal effort. Abdomen: Soft, non-tender, non-distended, no HSM. No bruits or masses. +BS  Extremities: No cyanosis, clubbing, or rash. R and LLE no edema. RUE AVF + bruit/thrill Neuro: Alert & orientedx3, cranial nerves grossly intact. moves all 4 extremities w/o difficulty. Affect pleasant   ASSESSMENT & PLAN:  1. Chronic Systolic Heart Fatigue:  Mixed ICM/NICM, EF 37% per c-MRI. EF 25-30% (Echo 6/15) Most recent EF 40-45% in 1/18 - NYHA III, confounded by fatigue and deconditioning.  - Volume status per HD. - Off ACE/ARB/ARNI due to ESRD.  - Now off BB with hypotension.  2. CAD - No s/s of ischemia.    - Myoview 01/22/18: EF 44%, No ischemia or infarction.  - s/p PCI of LAD 11/17.  -  Recurrent NSTEMI in 1/18 due to SVT. Cath films reviewed has 80-90% long lesion in RCA but not amenable to PCI due to tortuosity -  Continue ASA 81 mg daily. Has not been able to tolerate statins due to myositis. Continue Zetia and Welchol.  3. Hypotesion - On midodrine.  - BP remains very labile.  No med changes.  4. ESRD - HD Tues/Thur/Sat - His volume status looks great today after dialysis  yesterday. He may need his dry weight adjusted up.  5. PSVT - Resolved s/p ablation for AVNRT on 11/26/16 with resolution of symptoms.  - No change to current plan.   6. Deconditioning - Multifactorial due to lack of activity and long-term HD.  - Will refer to PT. He cannot walk 100 feet without fatigue/SOB.   No change to meds with ESRD and labile BP. RTC 6 months.   Shirley Friar, PA-C  9:09 AM  Greater than 50% of the 25 minute visit was spent in counseling/coordination of care regarding disease state education, salt/fluid restriction, sliding scale diuretics, and medication compliance.

## 2018-01-30 ENCOUNTER — Other Ambulatory Visit: Payer: Self-pay | Admitting: Pharmacy Technician

## 2018-01-30 DIAGNOSIS — E119 Type 2 diabetes mellitus without complications: Secondary | ICD-10-CM | POA: Diagnosis not present

## 2018-01-30 DIAGNOSIS — N2581 Secondary hyperparathyroidism of renal origin: Secondary | ICD-10-CM | POA: Diagnosis not present

## 2018-01-30 DIAGNOSIS — D631 Anemia in chronic kidney disease: Secondary | ICD-10-CM | POA: Diagnosis not present

## 2018-01-30 DIAGNOSIS — N186 End stage renal disease: Secondary | ICD-10-CM | POA: Diagnosis not present

## 2018-01-30 NOTE — Patient Outreach (Signed)
[place] [facility]) Care Management  01/30/2018  [name] 1950/11/20 [id number]   Successful outreach call to patients wife, [name], Laurie identifiers verified. [name] confirmed that patient has receive his Zetia from [employer] patient assistance. Counseled her how to place refill order when needed.  [name] has questions about sores on the patients legs that are painful and creams that have been prescribed to help and they haven't. She stated the last cream that was sent in for the patient was not covered by insurance and that it was over $200, she is not sure what the name of the drug. She questioned if there are other options for drugs and/or what type of Doctor besides his primary or dermatologist could she take him too. She says she was told that he should not be developing sores because he is on dialysis. I informed her, I could refer her to a Pharmacist that could help answer questions about the drugs that have been prescribed in the past.  Will route note to Rph for consultation  [name], [place] Management [phone]

## 2018-01-31 DIAGNOSIS — C61 Malignant neoplasm of prostate: Secondary | ICD-10-CM | POA: Diagnosis not present

## 2018-02-01 DIAGNOSIS — N186 End stage renal disease: Secondary | ICD-10-CM | POA: Diagnosis not present

## 2018-02-01 DIAGNOSIS — E119 Type 2 diabetes mellitus without complications: Secondary | ICD-10-CM | POA: Diagnosis not present

## 2018-02-01 DIAGNOSIS — D631 Anemia in chronic kidney disease: Secondary | ICD-10-CM | POA: Diagnosis not present

## 2018-02-01 DIAGNOSIS — N2581 Secondary hyperparathyroidism of renal origin: Secondary | ICD-10-CM | POA: Diagnosis not present

## 2018-02-03 ENCOUNTER — Other Ambulatory Visit: Payer: Self-pay | Admitting: Pharmacist

## 2018-02-03 NOTE — Patient Outreach (Signed)
[place] [facility]) Care Management  02/03/2018  [name] July 31, 1951 [id number]   67 year old male being followed by [place] team for medication assistance with Zetia which patient has received successfully from [employer] PAP.  Patient now requesting medication assistance with a topical cream as well.    Unsuccessful call placed to [name] on both home and mobile phone numbers listed in [facility].  I left a HIPAA compliant voicemail on the home phone.  Spouse answered mobile phone and stated she could not talk right now as she is at a doctor's office.    Plan: I will await call back from [name] and will reach out to him again later this week if I have not heard back yet.    Ralene Bathe, PharmD, [place] [phone]

## 2018-02-04 DIAGNOSIS — N186 End stage renal disease: Secondary | ICD-10-CM | POA: Diagnosis not present

## 2018-02-04 DIAGNOSIS — E119 Type 2 diabetes mellitus without complications: Secondary | ICD-10-CM | POA: Diagnosis not present

## 2018-02-04 DIAGNOSIS — D631 Anemia in chronic kidney disease: Secondary | ICD-10-CM | POA: Diagnosis not present

## 2018-02-04 DIAGNOSIS — N2581 Secondary hyperparathyroidism of renal origin: Secondary | ICD-10-CM | POA: Diagnosis not present

## 2018-02-05 DIAGNOSIS — Z6829 Body mass index (BMI) 29.0-29.9, adult: Secondary | ICD-10-CM | POA: Diagnosis not present

## 2018-02-05 DIAGNOSIS — M48062 Spinal stenosis, lumbar region with neurogenic claudication: Secondary | ICD-10-CM | POA: Diagnosis not present

## 2018-02-06 ENCOUNTER — Ambulatory Visit: Payer: Self-pay | Admitting: Pharmacist

## 2018-02-06 ENCOUNTER — Other Ambulatory Visit: Payer: Self-pay | Admitting: Pharmacist

## 2018-02-06 DIAGNOSIS — N186 End stage renal disease: Secondary | ICD-10-CM | POA: Diagnosis not present

## 2018-02-06 DIAGNOSIS — D631 Anemia in chronic kidney disease: Secondary | ICD-10-CM | POA: Diagnosis not present

## 2018-02-06 DIAGNOSIS — E119 Type 2 diabetes mellitus without complications: Secondary | ICD-10-CM | POA: Diagnosis not present

## 2018-02-06 DIAGNOSIS — N2581 Secondary hyperparathyroidism of renal origin: Secondary | ICD-10-CM | POA: Diagnosis not present

## 2018-02-06 NOTE — Patient Outreach (Addendum)
[place] [facility]) Care Management  02/06/2018  MARIEL GAUDIN [birth year] [id number]  Unsuccessful outreach attempt #2 to Mr. Treven Holtman today regarding medication assistance. I left a HIPAA compliant voicemail on the home and mobile phone numbers in [facility] as well as with his son Jefm Bryant (cellphone).    Plan: I will mail patient a letter describing [facility] services and will follow-up with him again next week.   Ralene Bathe, PharmD, [place] [phone]

## 2018-02-07 ENCOUNTER — Ambulatory Visit: Payer: Medicare Other

## 2018-02-08 DIAGNOSIS — E119 Type 2 diabetes mellitus without complications: Secondary | ICD-10-CM | POA: Diagnosis not present

## 2018-02-08 DIAGNOSIS — N186 End stage renal disease: Secondary | ICD-10-CM | POA: Diagnosis not present

## 2018-02-08 DIAGNOSIS — N2581 Secondary hyperparathyroidism of renal origin: Secondary | ICD-10-CM | POA: Diagnosis not present

## 2018-02-08 DIAGNOSIS — D631 Anemia in chronic kidney disease: Secondary | ICD-10-CM | POA: Diagnosis not present

## 2018-02-10 ENCOUNTER — Ambulatory Visit: Payer: Self-pay | Admitting: Pharmacist

## 2018-02-10 ENCOUNTER — Other Ambulatory Visit: Payer: Self-pay | Admitting: Pharmacist

## 2018-02-10 DIAGNOSIS — N186 End stage renal disease: Secondary | ICD-10-CM | POA: Diagnosis not present

## 2018-02-10 DIAGNOSIS — Z992 Dependence on renal dialysis: Secondary | ICD-10-CM | POA: Diagnosis not present

## 2018-02-10 DIAGNOSIS — T82858A Stenosis of vascular prosthetic devices, implants and grafts, initial encounter: Secondary | ICD-10-CM | POA: Diagnosis not present

## 2018-02-10 DIAGNOSIS — I871 Compression of vein: Secondary | ICD-10-CM | POA: Diagnosis not present

## 2018-02-10 NOTE — Patient Outreach (Signed)
[place] [facility]) Care Management  02/10/2018  [name] 07-24-1951 [id number]  Outreach attempt to [name] this afternoon.  I spoke with Mr. Bradburn spouse, [name], who requests that I call back tomorrow afternoon as she is busy today.    Plan: I will call patient tomorrow as requested at 2:00PM.   Ralene Bathe, PharmD, [place] [phone]

## 2018-02-11 ENCOUNTER — Ambulatory Visit: Payer: Self-pay | Admitting: Pharmacist

## 2018-02-11 ENCOUNTER — Other Ambulatory Visit: Payer: Self-pay | Admitting: Pharmacist

## 2018-02-11 DIAGNOSIS — E119 Type 2 diabetes mellitus without complications: Secondary | ICD-10-CM | POA: Diagnosis not present

## 2018-02-11 DIAGNOSIS — N186 End stage renal disease: Secondary | ICD-10-CM | POA: Diagnosis not present

## 2018-02-11 DIAGNOSIS — D631 Anemia in chronic kidney disease: Secondary | ICD-10-CM | POA: Diagnosis not present

## 2018-02-11 DIAGNOSIS — N2581 Secondary hyperparathyroidism of renal origin: Secondary | ICD-10-CM | POA: Diagnosis not present

## 2018-02-11 IMAGING — DX DG CHEST 2V
3 series · 3 of 3 positions shown · non-contrast
Comparison: Chest x-ray dated 12/26/2016. Chest x-ray dated
08/04/2016.

CLINICAL DATA: Patient states that he is having burning feeling in
chest and very weak, states he has blood coming out of his rectum,
that is a dark red and bright red. Hx of hyperlipidemia, NSTEMI,
prostate cancer, diabetes, hypertension.

EXAM:
CHEST  2 VIEW

[w chest pa]
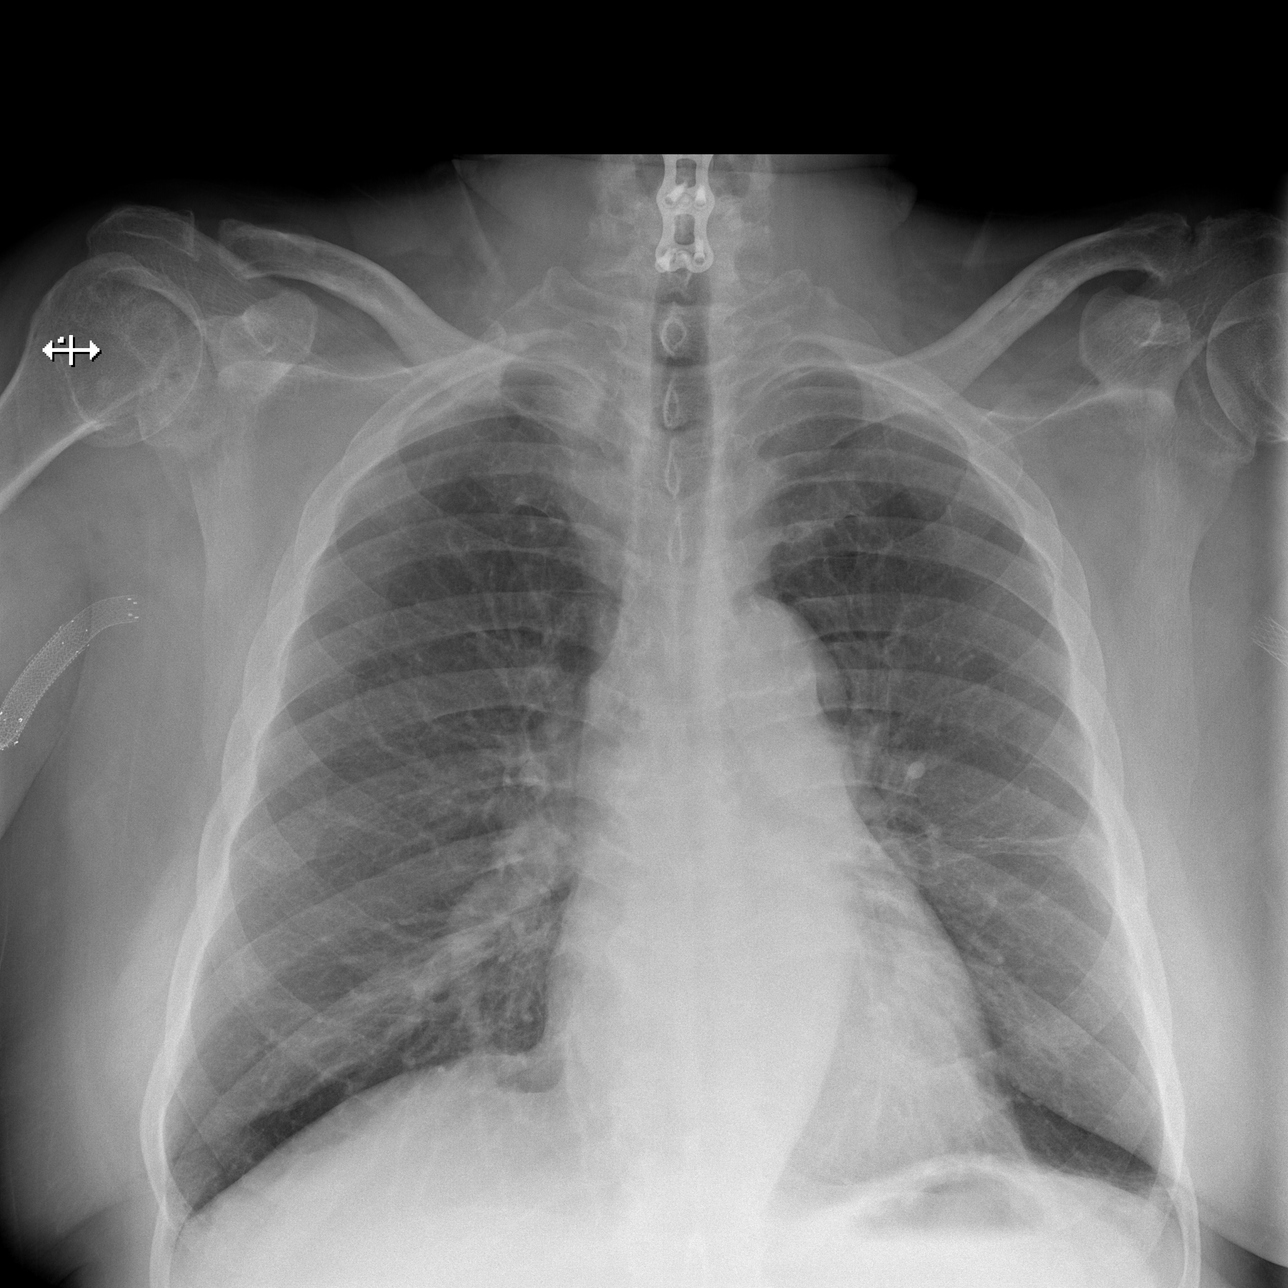

[w chest lat]
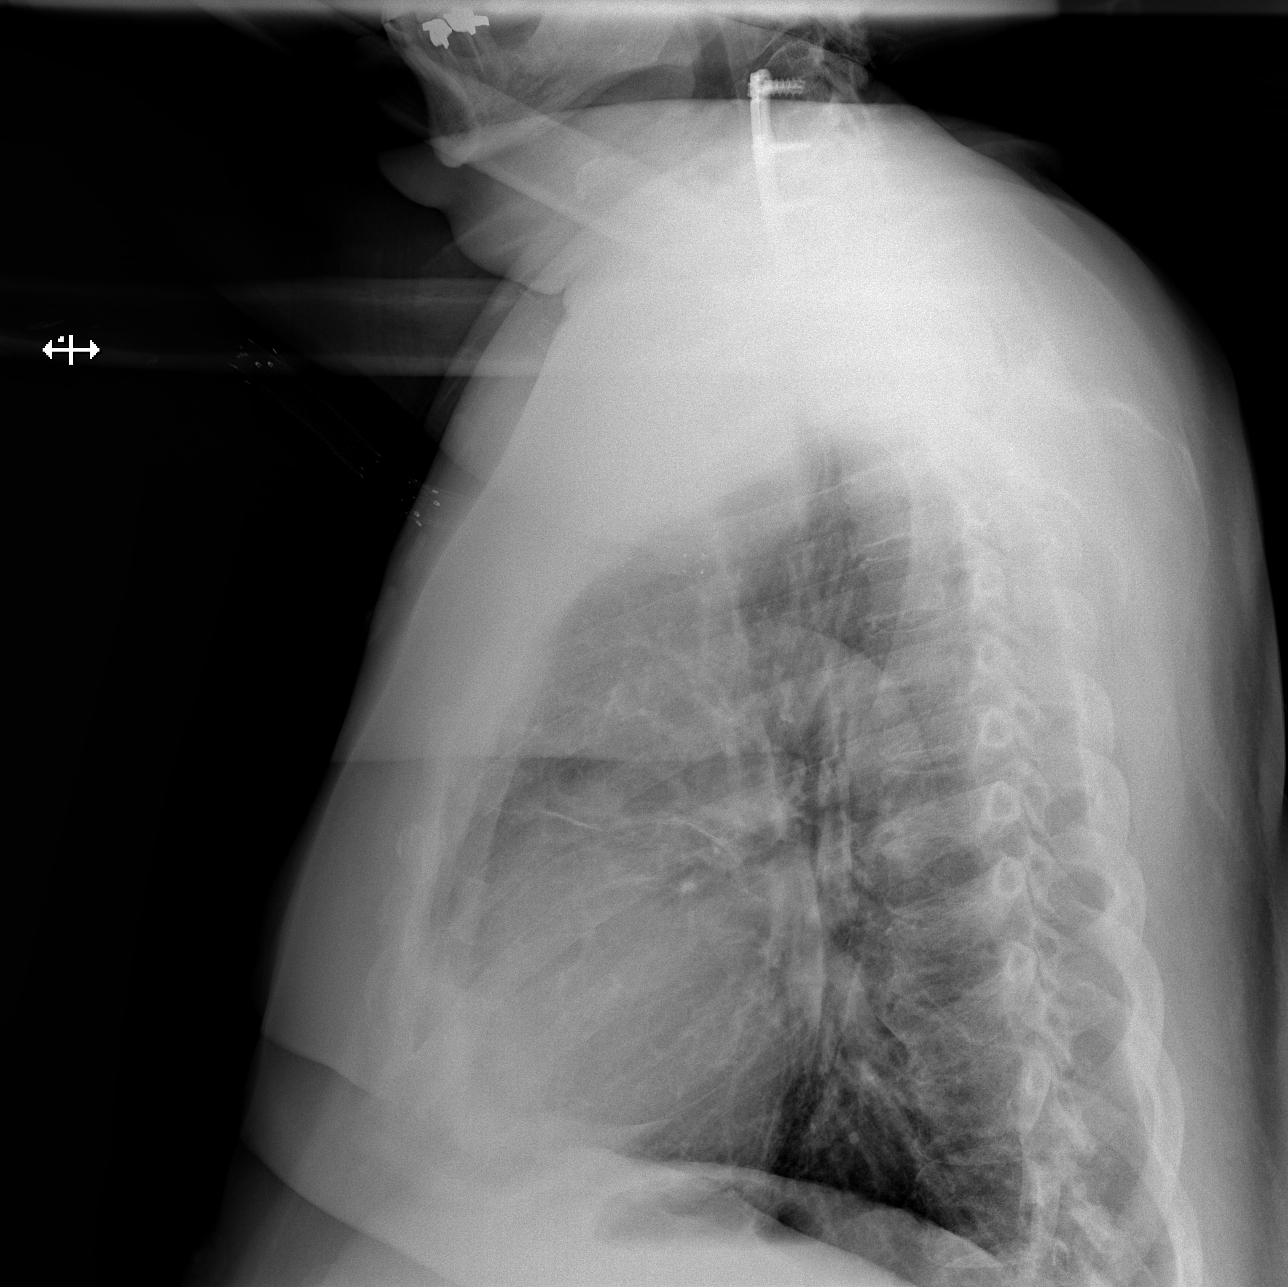

[w chest decub]
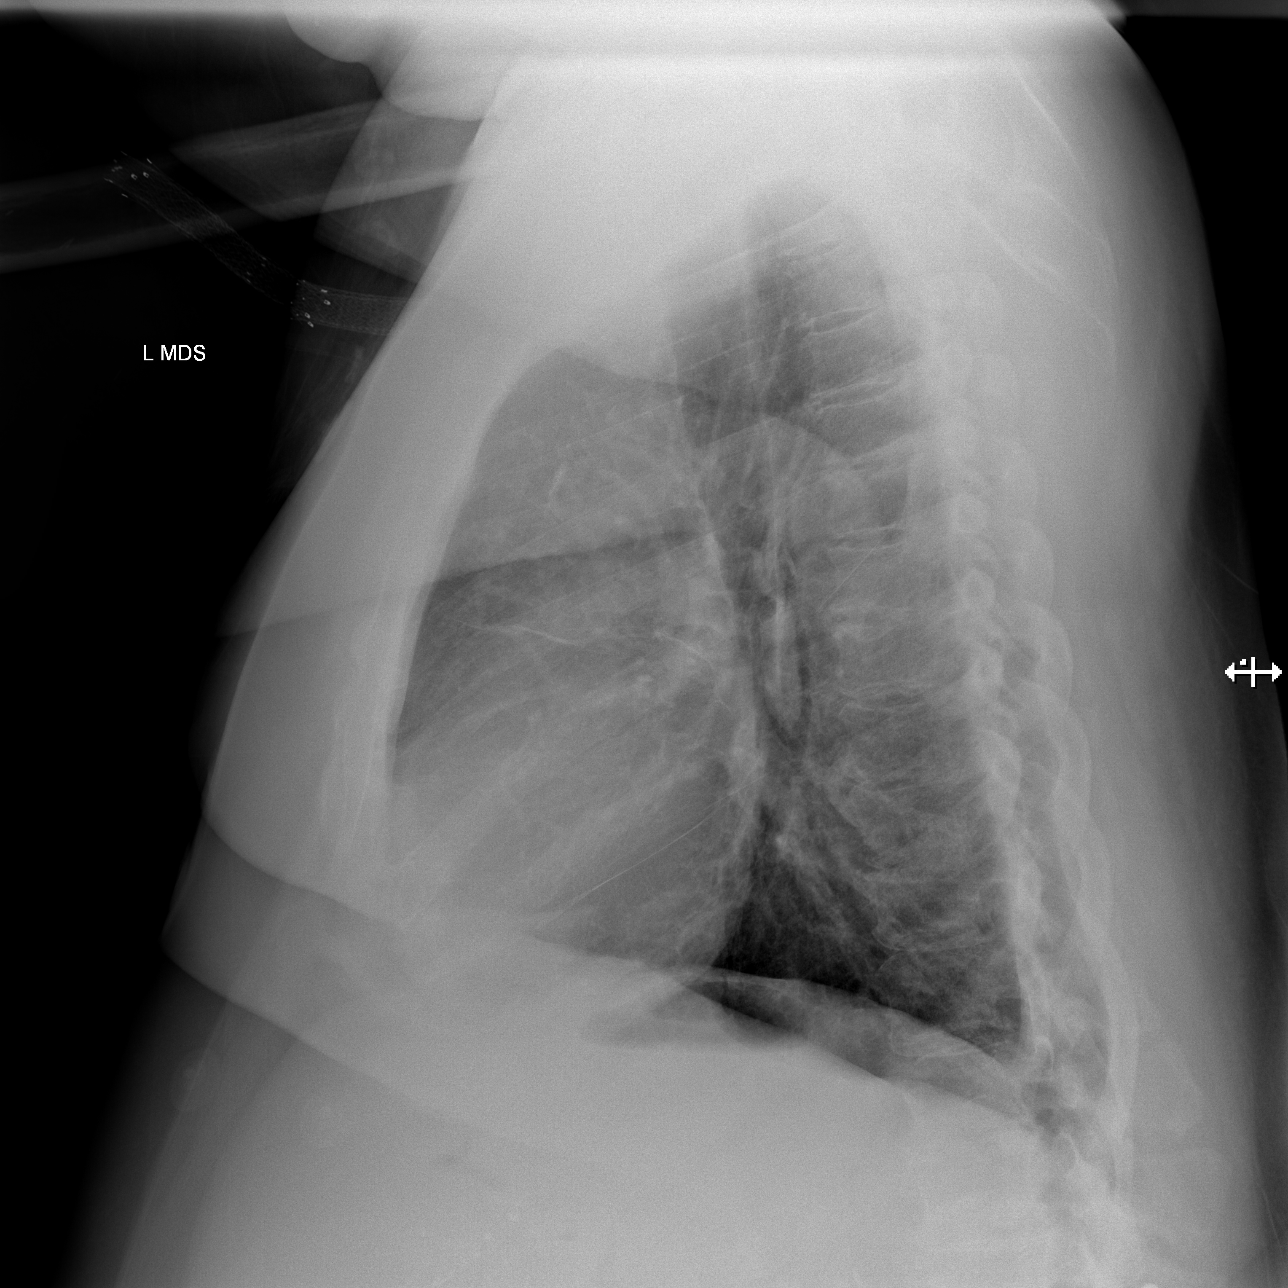

[3 of 3 positions shown; findings below may reference images not displayed]

FINDINGS: Heart size and mediastinal contours are within normal limits. Aortic
atherosclerosis.

Mild scarring in the left mid lung. Lungs otherwise clear. No
pleural effusion or pneumothorax seen. No acute or suspicious
osseous finding.
IMPRESSION: No active cardiopulmonary disease. No evidence of pneumonia or
pulmonary edema.

Aortic atherosclerosis.

## 2018-02-11 IMAGING — CT CT ABD-PELV W/O CM
2 of 4 series · 16 of 46 positions shown, 18 images · non-contrast
Comparison: 10/14/2015

CLINICAL DATA: Rectal bleeding and weakness for several weeks,
initial encounter

EXAM:
CT ABDOMEN AND PELVIS WITHOUT CONTRAST
TECHNIQUE: Multidetector CT imaging of the abdomen and pelvis was performed
following the standard protocol without IV contrast.

[Series 3: a/p w/o 5mm · axial · non-contrast · 0.92mm/px · z∈[+842,+1232]mm · 13 of 86 slices shown, 15 images]
[im 4/86  soft-tissue]
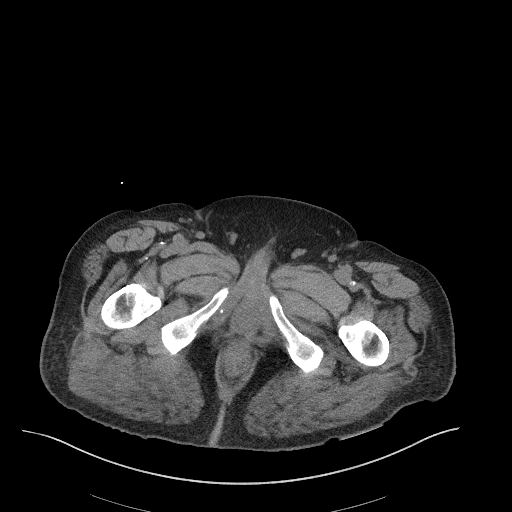
[im 4/86  bone]
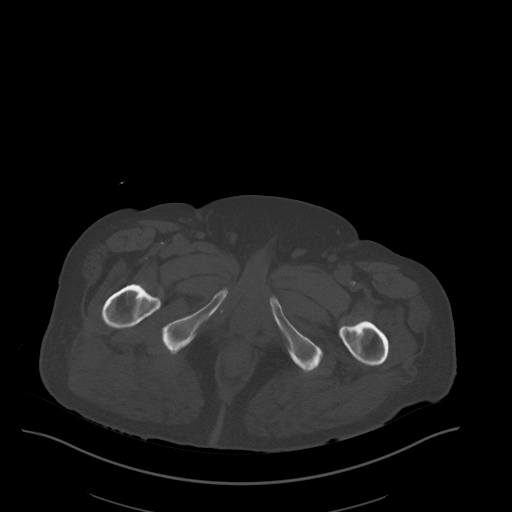
[im 11/86  soft-tissue]
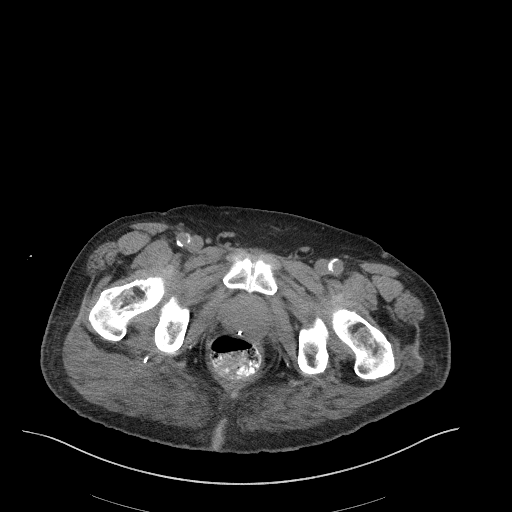
[im 18/86  soft-tissue]
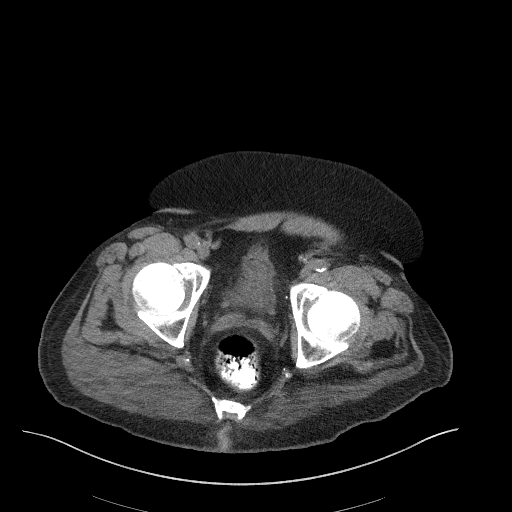
[im 24/86  soft-tissue]
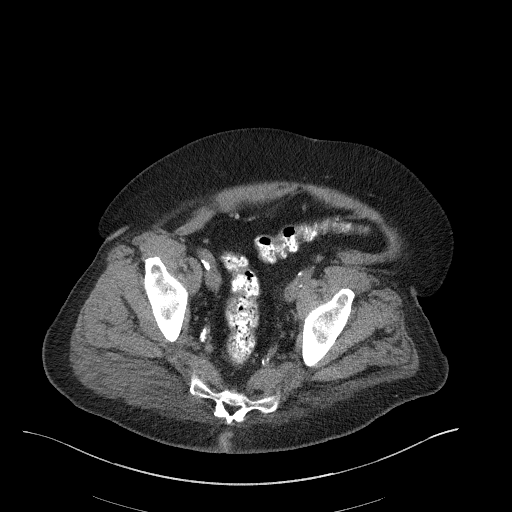
[im 31/86  soft-tissue]
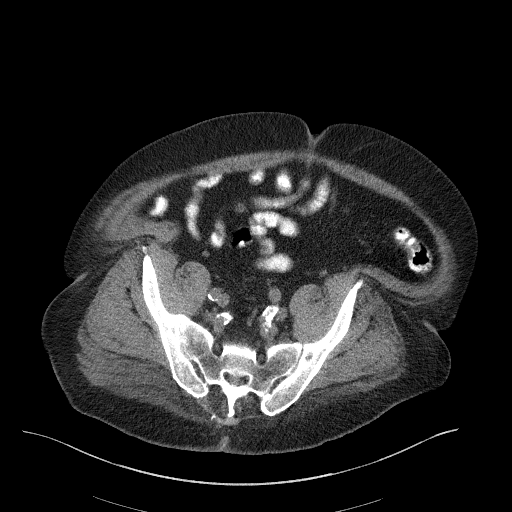
[im 38/86  soft-tissue]
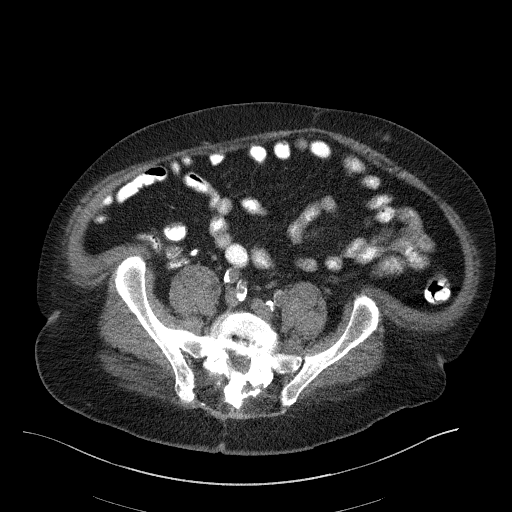
[im 45/86  soft-tissue]
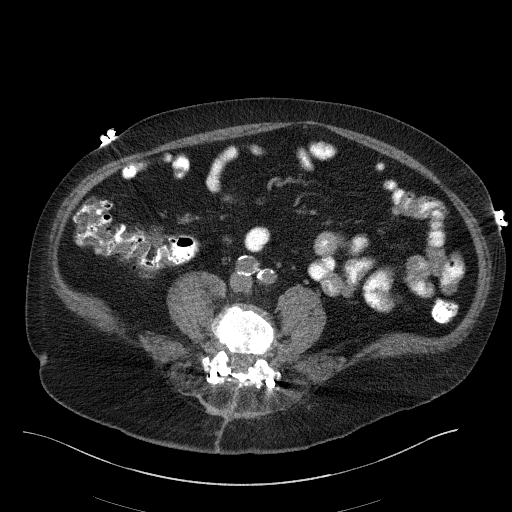
[im 48/86  soft-tissue]
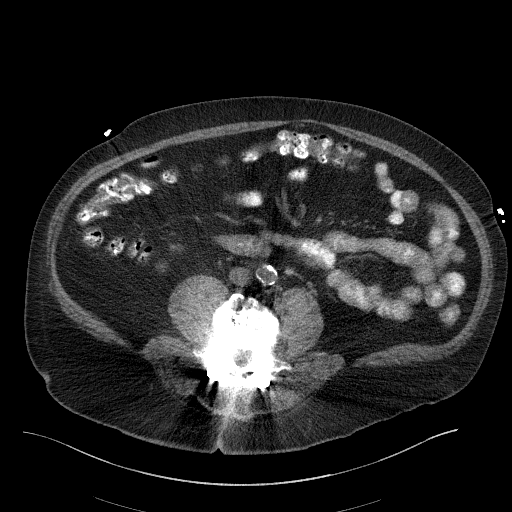
[im 55/86  soft-tissue]
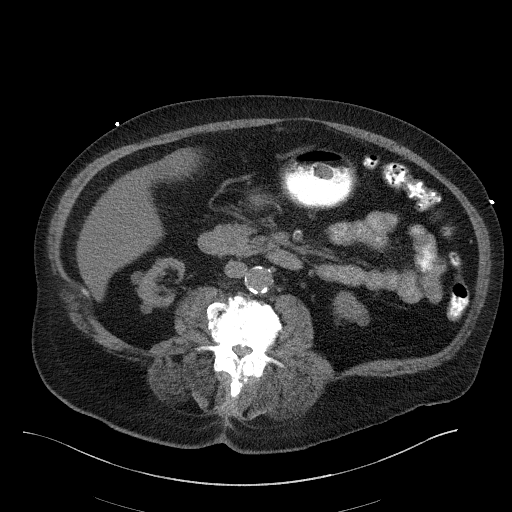
[im 55/86  bone]
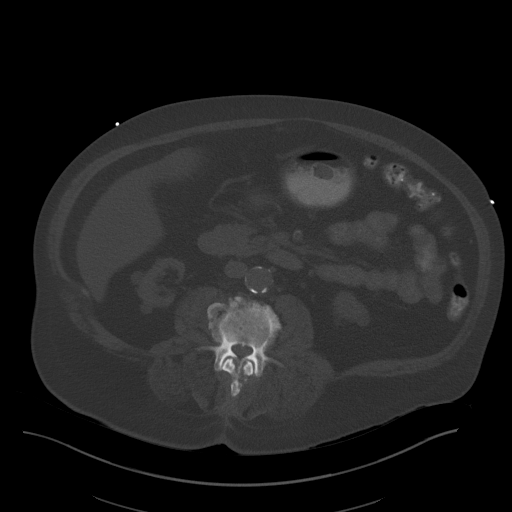
[im 62/86  soft-tissue]
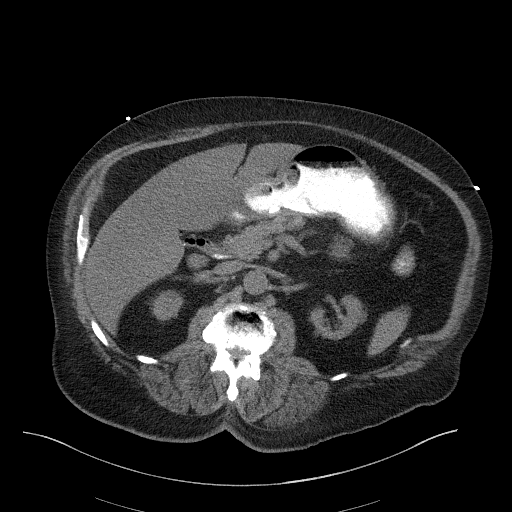
[im 69/86  soft-tissue]
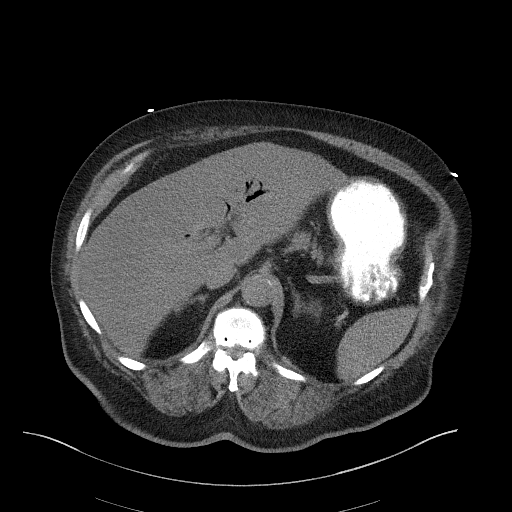
[im 75/86  soft-tissue]
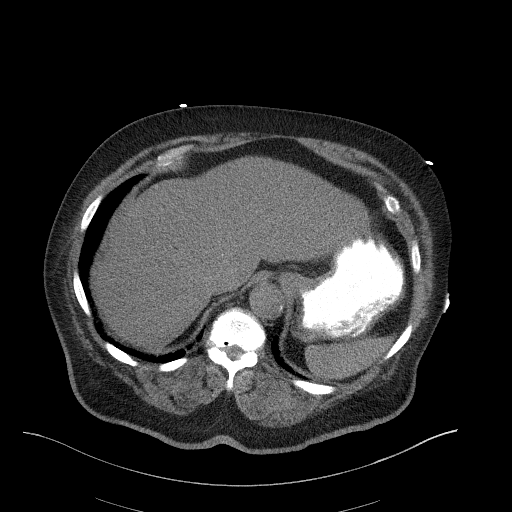
[im 82/86  soft-tissue]
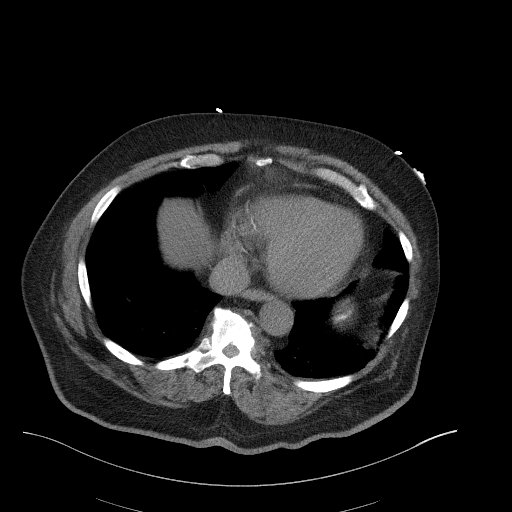

[Series 6: a/p w/o cor · coronal · non-contrast · 0.83mm/px · 3 of 165 slices shown]
[im 55/165  soft-tissue]
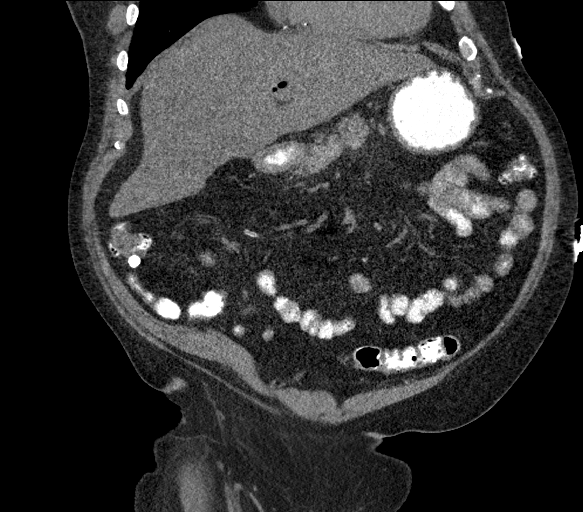
[im 73/165  soft-tissue]
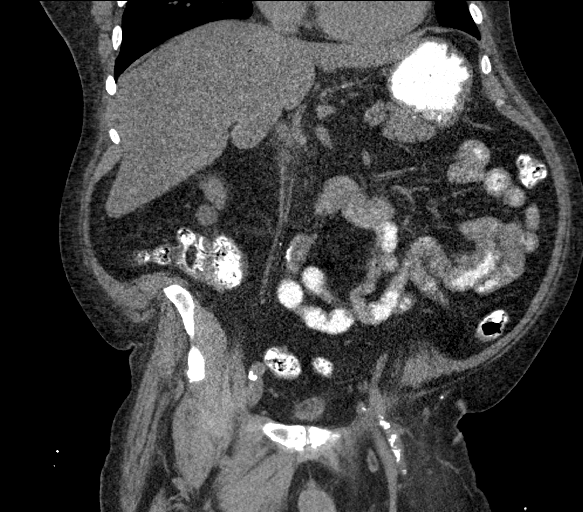
[im 92/165  soft-tissue]
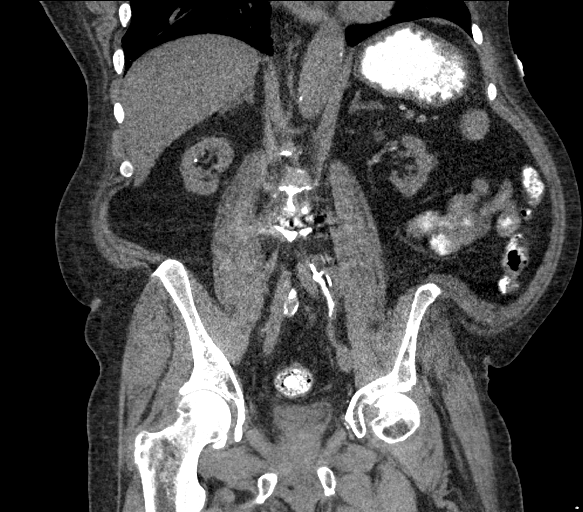

[16 of 46 positions shown; findings below may reference images not displayed]

FINDINGS: Lower chest: Lung bases are well aerated. Only minimal dependent
atelectatic changes are seen. Some mild bronchiectasis is noted in
the right lower lobe.

Hepatobiliary: Gallbladder has been surgically removed. Pneumobilia
is noted likely related to prior intervention.

Pancreas: Unremarkable. No pancreatic ductal dilatation or
surrounding inflammatory changes.

Spleen: Normal in size without focal abnormality.

Adrenals/Urinary Tract: Kidneys demonstrate bilateral cystic change
and atrophy consistent with the given clinical history of end-stage
renal disease. A few tiny nonobstructing renal stones are seen. The
adrenal glands are unremarkable. The bladder is decompressed.

Stomach/Bowel: No obstructive changes are seen. No significant
inflammatory changes are noted. The appendix is within normal
limits.

Vascular/Lymphatic: Aortic atherosclerosis. No enlarged abdominal or
pelvic lymph nodes.

Reproductive: Prostate is unremarkable.

Other: No abdominal wall hernia or abnormality. No abdominopelvic
ascites.

Musculoskeletal: Degenerative changes of lumbar spine are seen. No
acute bony abnormality is noted. Postsurgical changes are also noted
in the lower lumbar spine.
IMPRESSION: Chronic changes similar to that seen on prior exam. No acute
abnormality is identified correspond with patient's given clinical
symptomatology.

## 2018-02-11 NOTE — Patient Outreach (Signed)
[place] [facility]) Care Management  02/11/2018  [name] 1950-12-30 [id number]   Unsuccessful call placed to [name]  today regarding medication assistance.   I have not received any return call or response from mailed letter from patient.   Plan: I will close patient case at this time as I have been unable to engage with patient.    Ralene Bathe, PharmD, [place] [phone]

## 2018-02-12 ENCOUNTER — Ambulatory Visit: Payer: Medicare Other

## 2018-02-12 DIAGNOSIS — M1712 Unilateral primary osteoarthritis, left knee: Secondary | ICD-10-CM | POA: Diagnosis not present

## 2018-02-13 DIAGNOSIS — E119 Type 2 diabetes mellitus without complications: Secondary | ICD-10-CM | POA: Diagnosis not present

## 2018-02-13 DIAGNOSIS — N186 End stage renal disease: Secondary | ICD-10-CM | POA: Diagnosis not present

## 2018-02-13 DIAGNOSIS — D631 Anemia in chronic kidney disease: Secondary | ICD-10-CM | POA: Diagnosis not present

## 2018-02-13 DIAGNOSIS — N2581 Secondary hyperparathyroidism of renal origin: Secondary | ICD-10-CM | POA: Diagnosis not present

## 2018-02-14 ENCOUNTER — Ambulatory Visit (INDEPENDENT_AMBULATORY_CARE_PROVIDER_SITE_OTHER): Payer: Medicare Other | Admitting: Pharmacist Clinician (PhC)/ Clinical Pharmacy Specialist

## 2018-02-14 DIAGNOSIS — E785 Hyperlipidemia, unspecified: Secondary | ICD-10-CM

## 2018-02-14 DIAGNOSIS — I2510 Atherosclerotic heart disease of native coronary artery without angina pectoris: Secondary | ICD-10-CM

## 2018-02-14 NOTE — Patient Instructions (Signed)
We will start the process to get Repatha covered by your insurance.  Once it is approved we can determine the price and apply for patient assistance if needed.  Continue taking your ezetimibe and Welchol as directed.    Evolocumab injection What is this medicine? EVOLOCUMAB (e voe LOK ue mab) is known as a PCSK9 inhibitor. It is used to lower the level of cholesterol in the blood. It may be used alone or in combination with other cholesterol-lowering drugs. This drug may also be used to reduce the risk of heart attack, stroke, and certain types of heart surgery in patients with heart disease. This medicine may be used for other purposes; ask your health care provider or pharmacist if you have questions. COMMON BRAND NAME(S): REPATHA What should I tell my health care provider before I take this medicine? They need to know if you have any of these conditions: -an unusual or allergic reaction to evolocumab, other medicines, foods, dyes, or preservatives -pregnant or trying to get pregnant -breast-feeding How should I use this medicine? This medicine is for injection under the skin. You will be taught how to prepare and give this medicine. Use exactly as directed. Take your medicine at regular intervals. Do not take your medicine more often than directed. It is important that you put your used needles and syringes in a special sharps container. Do not put them in a trash can. If you do not have a sharps container, call your pharmacist or health care provider to get one. Talk to your pediatrician regarding the use of this medicine in children. While this drug may be prescribed for children as young as 13 years for selected conditions, precautions do apply. Overdosage: If you think you have taken too much of this medicine contact a poison control center or emergency room at once. NOTE: This medicine is only for you. Do not share this medicine with others. What if I miss a dose? If you miss a dose,  take it as soon as you can if there are more than 7 days until the next scheduled dose, or skip the missed dose and take the next dose according to your original schedule. Do not take double or extra doses. What may interact with this medicine? Interactions are not expected. This list may not describe all possible interactions. Give your health care provider a list of all the medicines, herbs, non-prescription drugs, or dietary supplements you use. Also tell them if you smoke, drink alcohol, or use illegal drugs. Some items may interact with your medicine. What should I watch for while using this medicine? You may need blood work while you are taking this medicine. What side effects may I notice from receiving this medicine? Side effects that you should report to your doctor or health care professional as soon as possible: -allergic reactions like skin rash, itching or hives, swelling of the face, lips, or tongue -signs and symptoms of infection like fever or chills; cough; sore throat; pain or trouble passing urine Side effects that usually do not require medical attention (report to your doctor or health care professional if they continue or are bothersome): -diarrhea -nausea -muscle pain -pain, redness, or irritation at site where injected This list may not describe all possible side effects. Call your doctor for medical advice about side effects. You may report side effects to FDA at 1-800-FDA-1088. Where should I keep my medicine? Keep out of the reach of children. You will be instructed on how to store this  medicine. Throw away any unused medicine after the expiration date on the label. NOTE: This sheet is a summary. It may not cover all possible information. If you have questions about this medicine, talk to your doctor, pharmacist, or health care provider.  2018 Elsevier/Gold Standard (2016-09-03 13:21:53)

## 2018-02-14 NOTE — Assessment & Plan Note (Signed)
Patient with familial hyperlipidemia, baseline LDL from 2011 was 343.  Currently at 194 on ezetimibe and Welchol.   Will submit request to insurance company for [place] 720 mg SureClick.  Patient knows to continue with current medications.  Will repeat labs after 4th or 5th dose to be sure of effectiveness.

## 2018-02-14 NOTE — Progress Notes (Signed)
02/14/2018 [name] 01-Apr-1951 [id number]   HPI:  [name] is a 67 y.o. male patient of Dr Gwenlyn Found, who presents today for a lipid clinic evaluation.  His CAD history includes multiple PCI (3) and NSTEMI (2).  First stent to RCA was Sept 2008, then had NSTEMI in Nov 2017 and another in Jan 2018 (they were unable to stent 80-90% blocked RCA).  In addition to this his medical history is significant for chronic systolic/diastolic heart failure, PSVT, obesity, DM, COPD (with nighttime O2), sleep apnea (on CPAP), ESRD (on hemodialysis), prostate cancer, gout and depression.   He has been unable to tolerate statin drugs, but continues on ezetimibe and colesvelam.  He has familial hyperlipidemia, with an LDL of 196, while on dual therapy.  Back in March 2011 a lab draw showed his LDL to be 343.    Current Medications:  Ezetimibe 10 mg daily, Welchol 6 tabs/day  [place] Lipid Score:  8 (baseline LDL of 343)    Cholesterol Goals:   LDL < 70  Intolerant/previously tried:  Muscle weakness with atorvastatin, rosuvastatin, simvastatin - took over 10-15 years ago  Family history:   Father had several MI's, died at 57  Mother without heart disease, died in her 63's  Sister with MI (age unknown)  No contact with other sibling  3 children, all with hypertension and hyperlipidemia   Diet:   Mix of home and fast food (burgers but not fries regularly, some salads); eats some veggies at home; canned/fresh/frozen; all proteins; some snacks/sweets, but has cut back over past few years.  Exercise:    Scheduled to start therapy Monday - after fall at dialysis last Saturday Labs:   11/2009:  TC 441, TG 388, HDL 20, LDL 343  12/2017:  TC 278, TG 169, HLD 48, LDL 196 (colesvelam and ezetimibe)  Current Outpatient Medications  Medication Sig Dispense Refill  . acetaminophen (TYLENOL) 325 MG tablet Take 650 mg by mouth every 6 (six) hours as needed for mild pain or headache.     . allopurinol (ZYLOPRIM)  100 MG tablet TAKE ONE TABLET BY MOUTH ONCE DAILY (Patient taking differently: TAKE ONE TABLET BY MOUTH ONCE in the evening) 90 tablet 3  . aspirin EC 81 MG tablet Take 81 mg by mouth daily.    . citalopram (CELEXA) 10 MG tablet Take 1 tablet (10 mg total) by mouth at bedtime. 90 tablet 3  . ezetimibe (ZETIA) 10 MG tablet TAKE ONE TABLET BY MOUTH ONCE DAILY 90 tablet 3  . ferric citrate (AURYXIA) 1 GM 210 MG(Fe) tablet Take 420 mg by mouth 3 (three) times daily with meals.    . gabapentin (NEURONTIN) 300 MG capsule Take 1 capsule (300 mg total) by mouth at bedtime. (Patient taking differently: Take 600 mg by mouth at bedtime. )    . hydroxypropyl methylcellulose / hypromellose (ISOPTO TEARS / GONIOVISC) 2.5 % ophthalmic solution Place 1 drop into both eyes 3 (three) times daily as needed for dry eyes. 15 mL 11  . lanthanum (FOSRENOL) 1000 MG chewable tablet Chew 1,000 mg by mouth 2 (two) times daily.    . midodrine (PROAMATINE) 10 MG tablet Take 1 tablet (10 mg total) by mouth See admin instructions. Once a day only on dialysis days (Tues/Thurs/Sat) 30 tablet 0  . multivitamin (RENA-VIT) TABS tablet Take 1 tablet by mouth daily. Reported on 10/24/2015    . pantoprazole (PROTONIX) 40 MG tablet Take 40 mg by mouth every evening.     [name]  polyethylene glycol (MIRALAX / GLYCOLAX) packet Take 17 g by mouth daily. (Patient taking differently: Take 17 g by mouth daily as needed for mild constipation. ) 14 each 0  . SENSIPAR 90 MG tablet Take 90 mg by mouth 2 (two) times daily.    Earnestine Mealing 625 MG tablet TAKE THREE TABLETS BY MOUTH TWICE DAILY WITH MEALS 180 tablet 11  . zolpidem (AMBIEN) 5 MG tablet Take 1 tablet (5 mg total) by mouth at bedtime as needed for sleep. 90 tablet 1   No current facility-administered medications for this visit.     Allergies  Allergen Reactions  . Cephalexin Swelling and Other (See Comments)    Tongue swelling  . Statins Other (See Comments)    Weak muscles  . Ciprofloxacin  Rash    Past Medical History:  Diagnosis Date  . Allergic rhinitis, cause unspecified 02/24/2014  . Anemia 06/16/2011  . BENIGN PROSTATIC HYPERTROPHY 10/14/2009  . CAD, NATIVE VESSEL 02/06/2009   a. 06/2007 s/p Taxus DES to the RCA;  b. 08/2016 NSTEMI in setting of SVT/PCI: LM 30ost, LAD 67m (3.0x16 Synergy DES), LCX 47m, OM1 60, RI 40, RCA 70p/m, 64m - not amenable to PCI.  [name] Cervical radiculopathy, chronic 02/23/2016   Right c5-6 by NCS/EMG  . Chronic combined systolic (congestive) and diastolic (congestive) heart failure ([place])    a. 10/2016 Echo: EF 40-45%, Gr2 DD. mildly dil LA.  [name] COLONIC POLYPS, HX OF 10/14/2009  . Dementia [id number]  . Depression 09/24/2015  . DIABETES MELLITUS, TYPE II 02/01/2010  . DYSLIPIDEMIA 06/18/2007  . ESRD (end stage renal disease) on dialysis ([place]) 08/04/2010   "TTS;  " (04/18/2015)  . FOOT PAIN 08/12/2008  . GAIT DISTURBANCE 03/03/2010  . GASTROENTERITIS, VIRAL 10/14/2009  . GERD 06/18/2007  . GOITER, MULTINODULAR 12/26/2007  . GOUT 06/18/2007  . GYNECOMASTIA 07/17/2010  . Hemodialysis access, fistula [facility])    Dialysis [facility] ([place]) Right upper arm fistula  . Hyperlipidemia 10/16/2011  . Hyperparathyroidism, secondary ([name]) 06/16/2011  . HYPERTENSION 06/18/2007  . Hyperthyroidism   . Hypocalcemia 06/07/2010  . Ischemic cardiomyopathy    a. 10/2016 Echo: EF 40-45%.  . Lumbar stenosis with neurogenic claudication   . ONYCHOMYCOSIS, TOENAILS 12/26/2007  . OSA on CPAP 10/16/2011  . Other malaise and fatigue 11/24/2009  . PERIPHERAL NEUROPATHY 06/18/2007  . Prostate cancer ([place])   . PSVT (paroxysmal supraventricular tachycardia) ([place])    a. 90/2409 complicated by NSTEMI;  b. 11/2016 Treated w/ adenosine in ED;  c. 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  [name] PULMONARY NODULE, RIGHT LOWER LOBE 06/08/2009  . Sleep apnea    cpap machine and o2  . TRANSAMINASES, SERUM, ELEVATED 02/01/2010  . Transfusion history    none recent  . Unspecified hypotension  01/30/2010    There were no vitals taken for this visit.   HLD (hyperlipidemia) Patient with familial hyperlipidemia, baseline LDL from 2011 was 343.  Currently at 194 on ezetimibe and Welchol.   Will submit request to insurance company for [place] 735 mg SureClick.  Patient knows to continue with current medications.  Will repeat labs after 4th or 5th dose to be sure of effectiveness.     Tommy Medal PharmD CPP [facility]

## 2018-02-15 DIAGNOSIS — E119 Type 2 diabetes mellitus without complications: Secondary | ICD-10-CM | POA: Diagnosis not present

## 2018-02-15 DIAGNOSIS — N2581 Secondary hyperparathyroidism of renal origin: Secondary | ICD-10-CM | POA: Diagnosis not present

## 2018-02-15 DIAGNOSIS — D631 Anemia in chronic kidney disease: Secondary | ICD-10-CM | POA: Diagnosis not present

## 2018-02-15 DIAGNOSIS — N186 End stage renal disease: Secondary | ICD-10-CM | POA: Diagnosis not present

## 2018-02-15 IMAGING — DX DG ABDOMEN 2V
3 series · 3 of 3 positions shown · non-contrast
Comparison: 04/22/2017 CT

CLINICAL DATA: Rectal pain status post colonoscopy

EXAM:
ABDOMEN - 2 VIEW

[w abdomen upright]
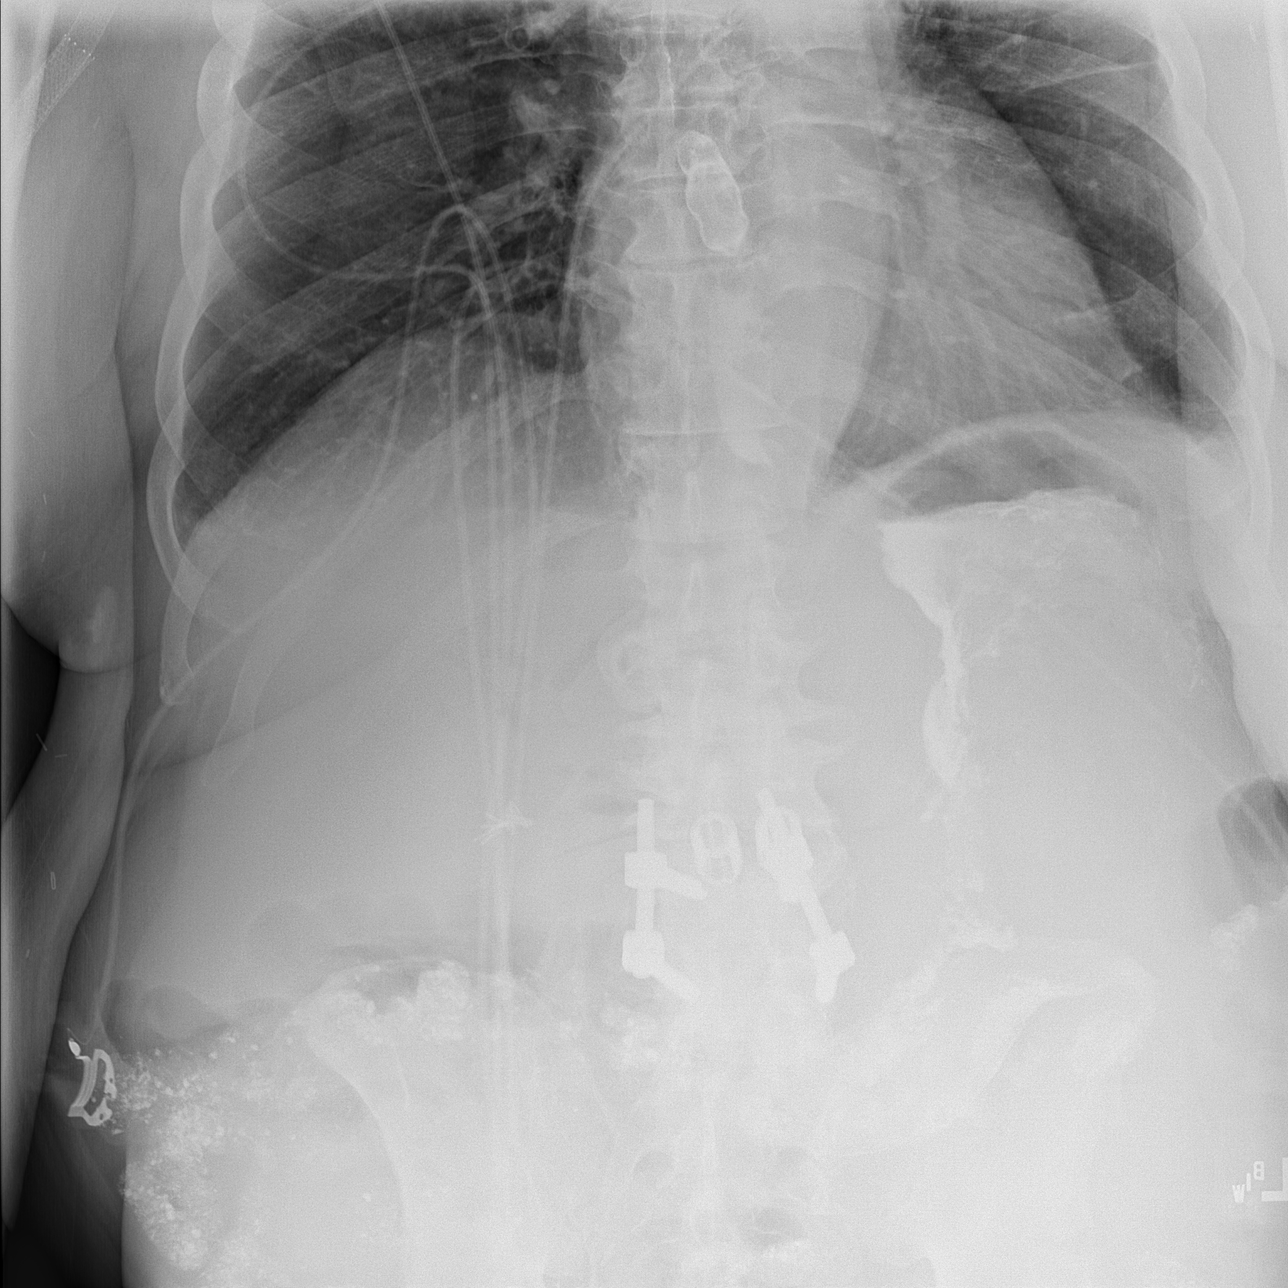

[t abdomen supine (1 of 2)]
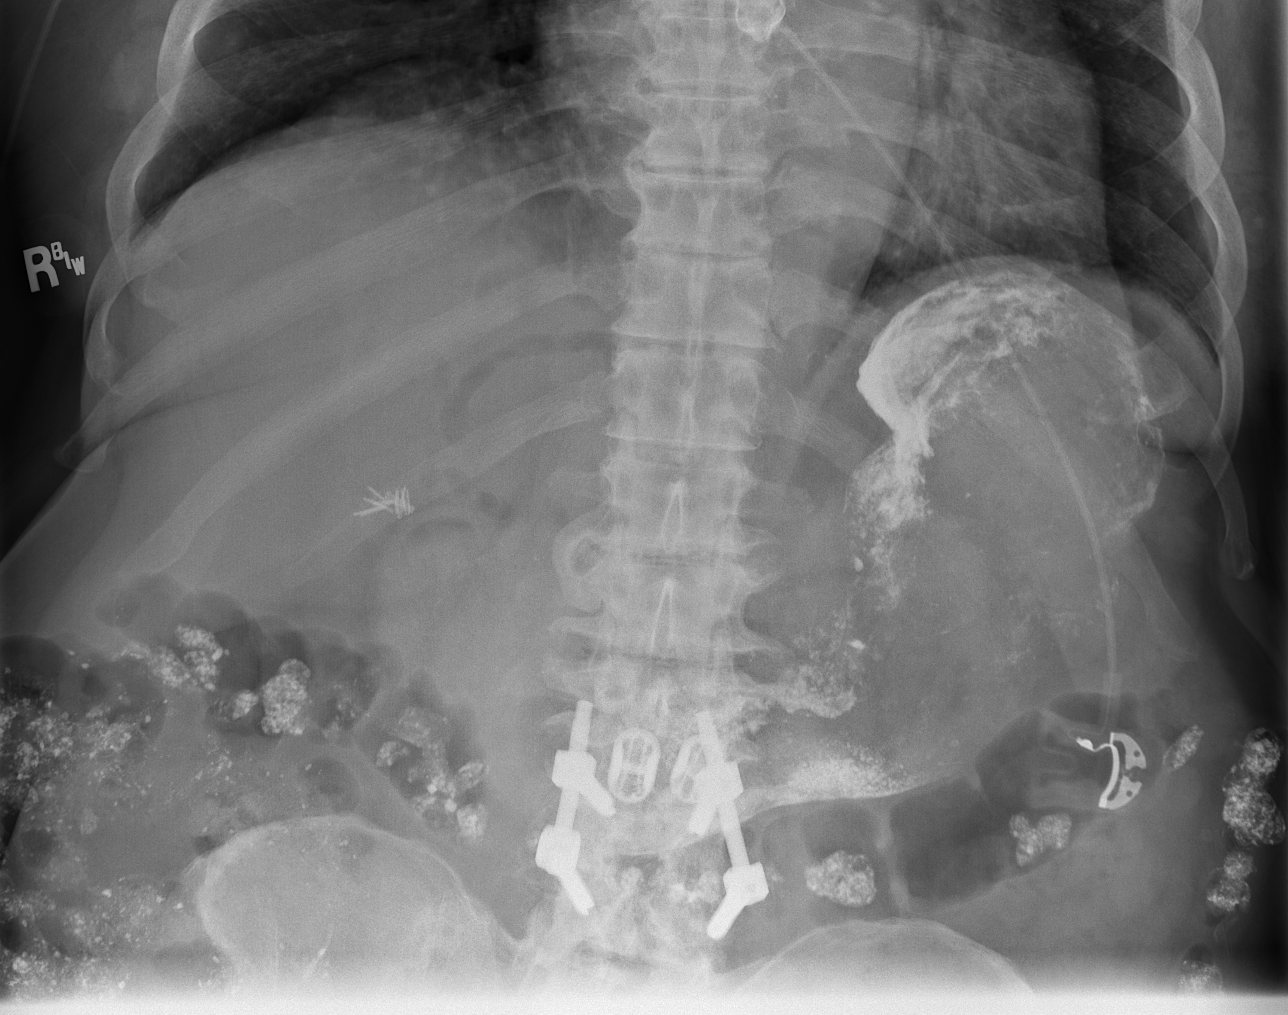

[t abdomen supine (2 of 2)]
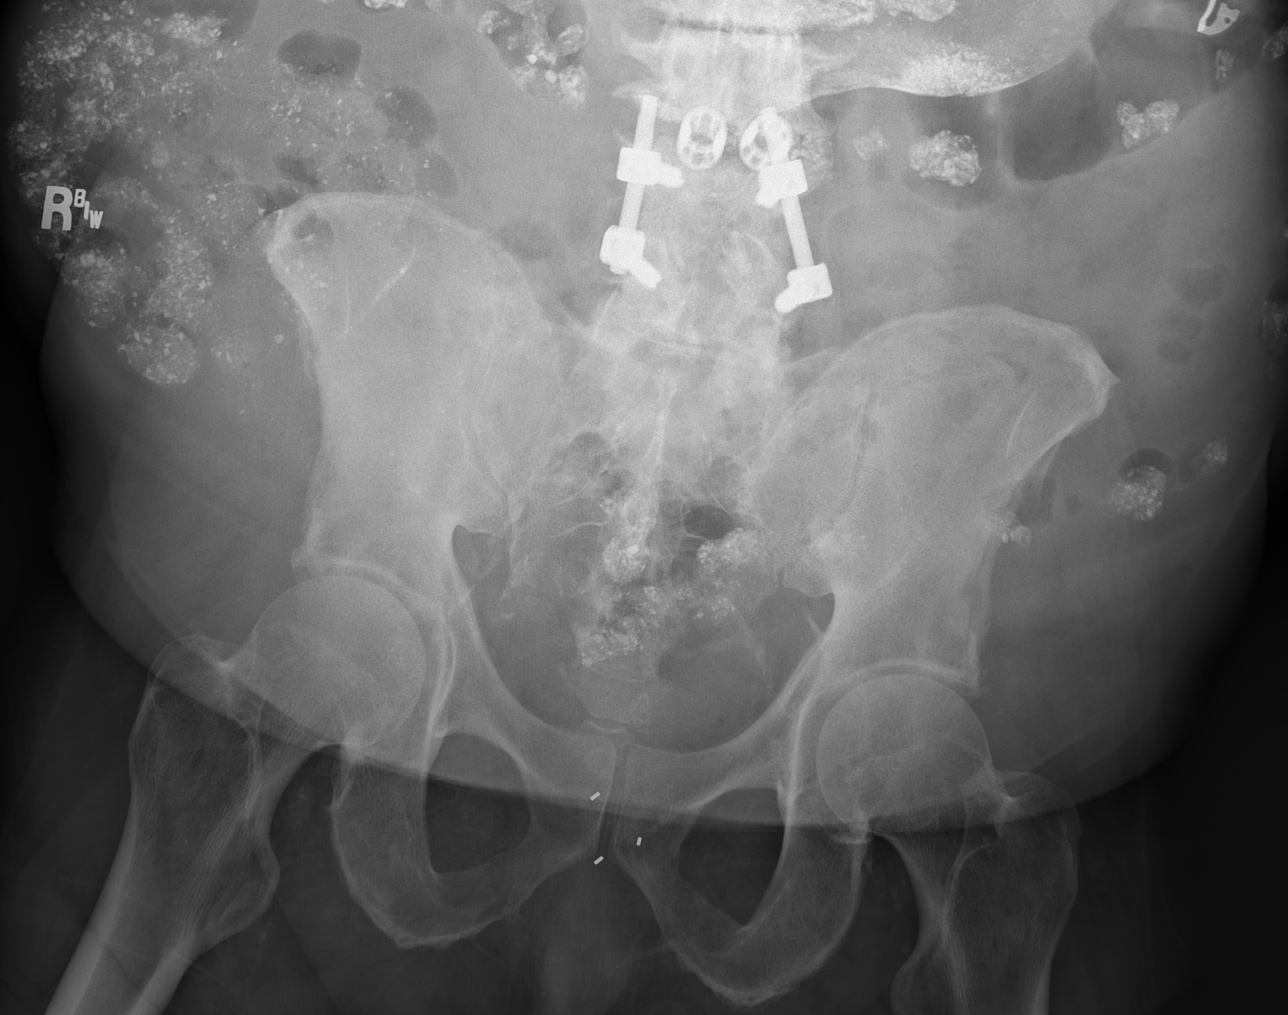

[3 of 3 positions shown; findings below may reference images not displayed]

FINDINGS: Lung bases are clear. No free air beneath the diaphragm. Retained
contrast material in the distal esophagus, stomach, small and large
bowel. Surgical clips in the right upper quadrant. Pneumobilia as
demonstrated on prior CT. Nonobstructed bowel-gas pattern metallic
densities overlying the pubic symphysis. Postsurgical changes of the
lower lumbar spine.
IMPRESSION: 1. Nonobstructed gas pattern with residual contrast material in the
distal esophagus, stomach, small and large bowel
2. Pneumobilia, likely due to postsurgical changes as was present on
prior CT.

## 2018-02-17 ENCOUNTER — Ambulatory Visit: Payer: Medicare Other | Admitting: Physical Therapy

## 2018-02-18 ENCOUNTER — Telehealth: Payer: Self-pay | Admitting: Pharmacist

## 2018-02-18 DIAGNOSIS — N186 End stage renal disease: Secondary | ICD-10-CM | POA: Diagnosis not present

## 2018-02-18 DIAGNOSIS — E119 Type 2 diabetes mellitus without complications: Secondary | ICD-10-CM | POA: Diagnosis not present

## 2018-02-18 DIAGNOSIS — D631 Anemia in chronic kidney disease: Secondary | ICD-10-CM | POA: Diagnosis not present

## 2018-02-18 DIAGNOSIS — N2581 Secondary hyperparathyroidism of renal origin: Secondary | ICD-10-CM | POA: Diagnosis not present

## 2018-02-18 NOTE — Patient Outreach (Signed)
[facility]) Care Management  02/18/2018  [name] [birth year] [id number]   Successful call placed to [name]'s spouse, [name].  HIPAA identifiers verified. [name] is agreeable to review patient's medications and assess for possible medication assistance.  She reports that patient was recently seen by pharmacist for lipid evaluation and may start on Repatha once covered by insurance.  Spouse reports only other medication that they have difficulty paying for is Welchol once patient enters the coverage gap.   Objective:   Medications Reviewed Today    Reviewed by Rudean Haskell, RPH (Pharmacist) on 02/18/18 at [place] List Status: <None>  Medication Order Taking? Sig Documenting Provider Last Dose Status Informant  acetaminophen (TYLENOL) 325 MG tablet [id number] Yes Take 650 mg by mouth every 6 (six) hours as needed for mild pain or headache.  [provider] Taking Active Spouse/Significant Other  allopurinol (ZYLOPRIM) 100 MG tablet [id number] Yes TAKE ONE TABLET BY MOUTH ONCE DAILY  Patient taking differently:  TAKE ONE TABLET BY MOUTH ONCE in the evening   Biagio Borg, MD Taking Active Spouse/Significant Other  aspirin EC 81 MG tablet [id number] Yes Take 81 mg by mouth daily. [provider] Taking Active Spouse/Significant Other  citalopram (CELEXA) 10 MG tablet [id number] Yes Take 1 tablet (10 mg total) by mouth at bedtime. Biagio Borg, MD Taking Active Spouse/Significant Other  ezetimibe (ZETIA) 10 MG tablet [id number] Yes TAKE ONE TABLET BY MOUTH ONCE DAILY Bensimhon, Shaune Pascal, MD Taking Active Spouse/Significant Other  ferric citrate (AURYXIA) 1 GM 210 MG(Fe) tablet [id number] Yes Take 420 mg by mouth 3 (three) times daily with meals. [provider] Taking Active Spouse/Significant Other  gabapentin (NEURONTIN) 300 MG capsule [id number] Yes Take 1 capsule (300 mg total) by mouth at bedtime.  Patient taking differently:  Take 600 mg  by mouth at bedtime.    Domenic Polite, MD Taking Active Spouse/Significant Other  hydroxypropyl methylcellulose / hypromellose (ISOPTO TEARS / GONIOVISC) 2.5 % ophthalmic solution [id number] Yes Place 1 drop into both eyes 3 (three) times daily as needed for dry eyes. Biagio Borg, MD Taking Active Spouse/Significant Other  midodrine (PROAMATINE) 10 MG tablet [id number] Yes Take 1 tablet (10 mg total) by mouth See admin instructions. Once a day only on dialysis days (Tues/Thurs/Sat) Elmarie Shiley, MD Taking Active Spouse/Significant Other  multivitamin (RENA-VIT) TABS tablet [id number] Yes Take 1 tablet by mouth daily. Reported on 10/24/2015 [provider] Taking Active Spouse/Significant Other  pantoprazole (PROTONIX) 40 MG tablet [id number] Yes Take 40 mg by mouth every evening.  [provider] Taking Active Spouse/Significant Other           Med Note Cottie Banda   Tue Sep 18, 2016 10:33 PM)    polyethylene glycol [facility] / GLYCOLAX) packet [id number] Yes Take 17 g by mouth daily.  Patient taking differently:  Take 17 g by mouth daily as needed for mild constipation.    Florencia Reasons, MD Taking Active Spouse/Significant Other  SENSIPAR 90 MG tablet [id number] Yes Take 90 mg by mouth 2 (two) times daily. [provider] Taking Active Spouse/Significant Other           Med Note Cottie Banda   Tue Sep 18, 2016 10:33 PM)    [facility] 625 MG tablet [id number] Yes TAKE THREE TABLETS BY MOUTH TWICE DAILY WITH MEALS Biagio Borg, MD Taking Active Spouse/Significant Other  zolpidem (AMBIEN) 5 MG tablet [id number] Yes Take 1 tablet (  5 mg total) by mouth at bedtime as needed for sleep. Biagio Borg, MD Taking Active Spouse/Significant Other  Med List Note Gwynne Edinger 09/10/15 2114): Tuesday, Thursday, Saturday at [employer]         Assessment:  Drugs sorted by system:  Neurologic/Psychologic: citalopram, gabapentin,  zolpidem  Cardiovascular: aspirin 81mg , ezetimibe,midodrine, welchol  Pulmonary/Allergy:none  Gastrointestinal:pantoprazole, polyethylene glycol  Endocrine:none  Renal:ferric citrate, renavite, sensipar  Topical:isopto tears eye drops  Pain: acetaminophen  Vitamins/Minerals:none  Infectious Diseases: none  Miscellaneous: allopurinol   Duplications in therapy:  Gaps in therapy: HLD - LDL not at goal, patient may start PCSK9 inhibitor therapy once approved through insurance Medications to avoid in the elderly:  Drug interactions:  Other issues noted:  Renal dose adjustments with dialysis: -Allopurinol dialyzable.  Initial dose recommended 100mg  on alternate days given post-dialysis, increase cautiously to 300mg  based on response.  Patient tolerating current dose without issue.   Patient assistance: Patient not eligible for Welchol patient assistance program based on stated income.  There is a coupon via goodrx.com for $178.07 / 1 month supply of medication.  Spouse requests that I mail her a copy of this coupon in the mail which I will do today so that patient can use this once he is in the coverage gap if desired.  No topical medication being used currently despite previous [facility] referral for medication assistance with cream.    Plan: I will close [place] case at this time as no further medication issues per spouse.  I am happy to assist in the future as needed.    Ralene Bathe, PharmD, [place] [phone]

## 2018-02-19 ENCOUNTER — Ambulatory Visit: Payer: Medicare Other | Admitting: Physical Therapy

## 2018-02-20 DIAGNOSIS — N2581 Secondary hyperparathyroidism of renal origin: Secondary | ICD-10-CM | POA: Diagnosis not present

## 2018-02-20 DIAGNOSIS — E119 Type 2 diabetes mellitus without complications: Secondary | ICD-10-CM | POA: Diagnosis not present

## 2018-02-20 DIAGNOSIS — N186 End stage renal disease: Secondary | ICD-10-CM | POA: Diagnosis not present

## 2018-02-20 DIAGNOSIS — D631 Anemia in chronic kidney disease: Secondary | ICD-10-CM | POA: Diagnosis not present

## 2018-02-22 DIAGNOSIS — D631 Anemia in chronic kidney disease: Secondary | ICD-10-CM | POA: Diagnosis not present

## 2018-02-22 DIAGNOSIS — E119 Type 2 diabetes mellitus without complications: Secondary | ICD-10-CM | POA: Diagnosis not present

## 2018-02-22 DIAGNOSIS — N2581 Secondary hyperparathyroidism of renal origin: Secondary | ICD-10-CM | POA: Diagnosis not present

## 2018-02-22 DIAGNOSIS — N186 End stage renal disease: Secondary | ICD-10-CM | POA: Diagnosis not present

## 2018-02-25 ENCOUNTER — Other Ambulatory Visit (HOSPITAL_COMMUNITY): Payer: Self-pay | Admitting: Cardiology

## 2018-02-25 DIAGNOSIS — E119 Type 2 diabetes mellitus without complications: Secondary | ICD-10-CM | POA: Diagnosis not present

## 2018-02-25 DIAGNOSIS — N186 End stage renal disease: Secondary | ICD-10-CM | POA: Diagnosis not present

## 2018-02-25 DIAGNOSIS — D631 Anemia in chronic kidney disease: Secondary | ICD-10-CM | POA: Diagnosis not present

## 2018-02-25 DIAGNOSIS — N2581 Secondary hyperparathyroidism of renal origin: Secondary | ICD-10-CM | POA: Diagnosis not present

## 2018-02-25 MED ORDER — MIDODRINE HCL 10 MG PO TABS: 10.0000 mg | ORAL_TABLET | ORAL | 3 refills | Status: DC

## 2018-02-27 ENCOUNTER — Other Ambulatory Visit: Payer: Self-pay | Admitting: Pharmacist Clinician (PhC)/ Clinical Pharmacy Specialist

## 2018-02-27 ENCOUNTER — Other Ambulatory Visit: Payer: Self-pay

## 2018-02-27 DIAGNOSIS — N186 End stage renal disease: Secondary | ICD-10-CM | POA: Diagnosis not present

## 2018-02-27 DIAGNOSIS — E119 Type 2 diabetes mellitus without complications: Secondary | ICD-10-CM | POA: Diagnosis not present

## 2018-02-27 DIAGNOSIS — D631 Anemia in chronic kidney disease: Secondary | ICD-10-CM | POA: Diagnosis not present

## 2018-02-27 DIAGNOSIS — N2581 Secondary hyperparathyroidism of renal origin: Secondary | ICD-10-CM | POA: Diagnosis not present

## 2018-02-27 MED ORDER — EVOLOCUMAB 140 MG/ML ~~LOC~~ SOAJ: 140.0000 mg | SUBCUTANEOUS | 12 refills | Status: DC

## 2018-02-27 MED ORDER — EVOLOCUMAB 140 MG/ML ~~LOC~~ SOAJ: 140.0000 mg | SUBCUTANEOUS | 0 refills | Status: DC

## 2018-02-27 NOTE — Telephone Encounter (Signed)
LMOM for patient to call back.  Repatha approved to 02/18/19.  Will send rx to [facility] on [address].  Once we determine copay, we can submit [place] if needed.

## 2018-03-01 DIAGNOSIS — N186 End stage renal disease: Secondary | ICD-10-CM | POA: Diagnosis not present

## 2018-03-01 DIAGNOSIS — E1129 Type 2 diabetes mellitus with other diabetic kidney complication: Secondary | ICD-10-CM | POA: Diagnosis not present

## 2018-03-01 DIAGNOSIS — D631 Anemia in chronic kidney disease: Secondary | ICD-10-CM | POA: Diagnosis not present

## 2018-03-01 DIAGNOSIS — E119 Type 2 diabetes mellitus without complications: Secondary | ICD-10-CM | POA: Diagnosis not present

## 2018-03-01 DIAGNOSIS — Z992 Dependence on renal dialysis: Secondary | ICD-10-CM | POA: Diagnosis not present

## 2018-03-01 DIAGNOSIS — N2581 Secondary hyperparathyroidism of renal origin: Secondary | ICD-10-CM | POA: Diagnosis not present

## 2018-03-04 DIAGNOSIS — N2581 Secondary hyperparathyroidism of renal origin: Secondary | ICD-10-CM | POA: Diagnosis not present

## 2018-03-04 DIAGNOSIS — D631 Anemia in chronic kidney disease: Secondary | ICD-10-CM | POA: Diagnosis not present

## 2018-03-04 DIAGNOSIS — E119 Type 2 diabetes mellitus without complications: Secondary | ICD-10-CM | POA: Diagnosis not present

## 2018-03-04 DIAGNOSIS — N186 End stage renal disease: Secondary | ICD-10-CM | POA: Diagnosis not present

## 2018-03-05 ENCOUNTER — Other Ambulatory Visit: Payer: Self-pay | Admitting: Pharmacist Clinician (PhC)/ Clinical Pharmacy Specialist

## 2018-03-05 MED ORDER — ALIROCUMAB 150 MG/ML ~~LOC~~ SOPN: 150.0000 mg | PEN_INJECTOR | SUBCUTANEOUS | 12 refills | Status: DC

## 2018-03-06 DIAGNOSIS — E119 Type 2 diabetes mellitus without complications: Secondary | ICD-10-CM | POA: Diagnosis not present

## 2018-03-06 DIAGNOSIS — D631 Anemia in chronic kidney disease: Secondary | ICD-10-CM | POA: Diagnosis not present

## 2018-03-06 DIAGNOSIS — N2581 Secondary hyperparathyroidism of renal origin: Secondary | ICD-10-CM | POA: Diagnosis not present

## 2018-03-06 DIAGNOSIS — N186 End stage renal disease: Secondary | ICD-10-CM | POA: Diagnosis not present

## 2018-03-07 ENCOUNTER — Telehealth: Payer: Self-pay | Admitting: Internal Medicine

## 2018-03-07 NOTE — Telephone Encounter (Unsigned)
Copied from [place] [phone]. Topic: Quick Communication - See Telephone Encounter >> Mar 07, 2018 12:19 PM Neva Seat wrote: Legrand Como w/ [facility]  [phone]  Faxing over a the request for the missing information on pt.  Pt was referred to them by Dr. [name].

## 2018-03-08 DIAGNOSIS — D631 Anemia in chronic kidney disease: Secondary | ICD-10-CM | POA: Diagnosis not present

## 2018-03-08 DIAGNOSIS — N2581 Secondary hyperparathyroidism of renal origin: Secondary | ICD-10-CM | POA: Diagnosis not present

## 2018-03-08 DIAGNOSIS — N186 End stage renal disease: Secondary | ICD-10-CM | POA: Diagnosis not present

## 2018-03-08 DIAGNOSIS — E119 Type 2 diabetes mellitus without complications: Secondary | ICD-10-CM | POA: Diagnosis not present

## 2018-03-08 IMAGING — NM NM GI BLOOD LOSS
2 series · 12 of 12 positions shown · non-contrast
Comparison: CT of the abdomen and pelvis performed 04/22/2017

CLINICAL DATA: Recent colonoscopy with removal of polyps. Active GI
bleeding. Initial encounter.

EXAM:
NUCLEAR MEDICINE GASTROINTESTINAL BLEEDING SCAN
TECHNIQUE: Sequential abdominal images were obtained following intravenous
administration of Hc-QQm labeled red blood cells.
RADIOPHARMACEUTICALS:  26.2 mCi Hc-QQm in-vitro labeled red cells.

[gi gi bleed · 5.01mm/px · 6 of 60 frames shown (1 of 2)]
[frame 6/60]
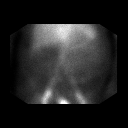
[frame 16/60]
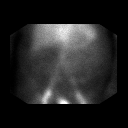
[frame 26/60]
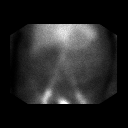
[frame 36/60]
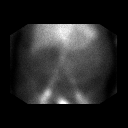
[frame 46/60]
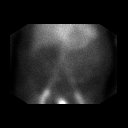
[frame 56/60]
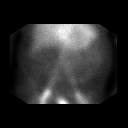

[gi gi bleed · 5.01mm/px · 6 of 60 frames shown (2 of 2)]
[frame 6/60]
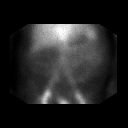
[frame 16/60]
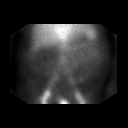
[frame 26/60]
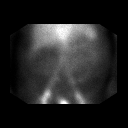
[frame 36/60]
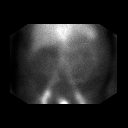
[frame 46/60]
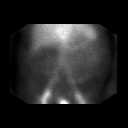
[frame 56/60]
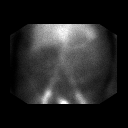

[12 of 12 positions shown; findings below may reference images not displayed]

FINDINGS: Normal blood pool activity is noted. There is accumulation of
activity within the bladder, as expected. No abnormal focal
accumulation of activity is seen to suggest active GI bleeding at
this time. The study was continued for a total of 2 hours.
IMPRESSION: No evidence of active GI bleeding on this study.

## 2018-03-09 IMAGING — DX DG CHEST 1V PORT
1 series · 1 of 1 positions shown · non-contrast
Comparison: 04/22/2017

CLINICAL DATA: Pulmonary edemaHx of CP, CAD, DM2, ESRD, PSVT,
pulmonary nodule (RRL)

EXAM:
PORTABLE CHEST 1 VIEW

[chest ap]
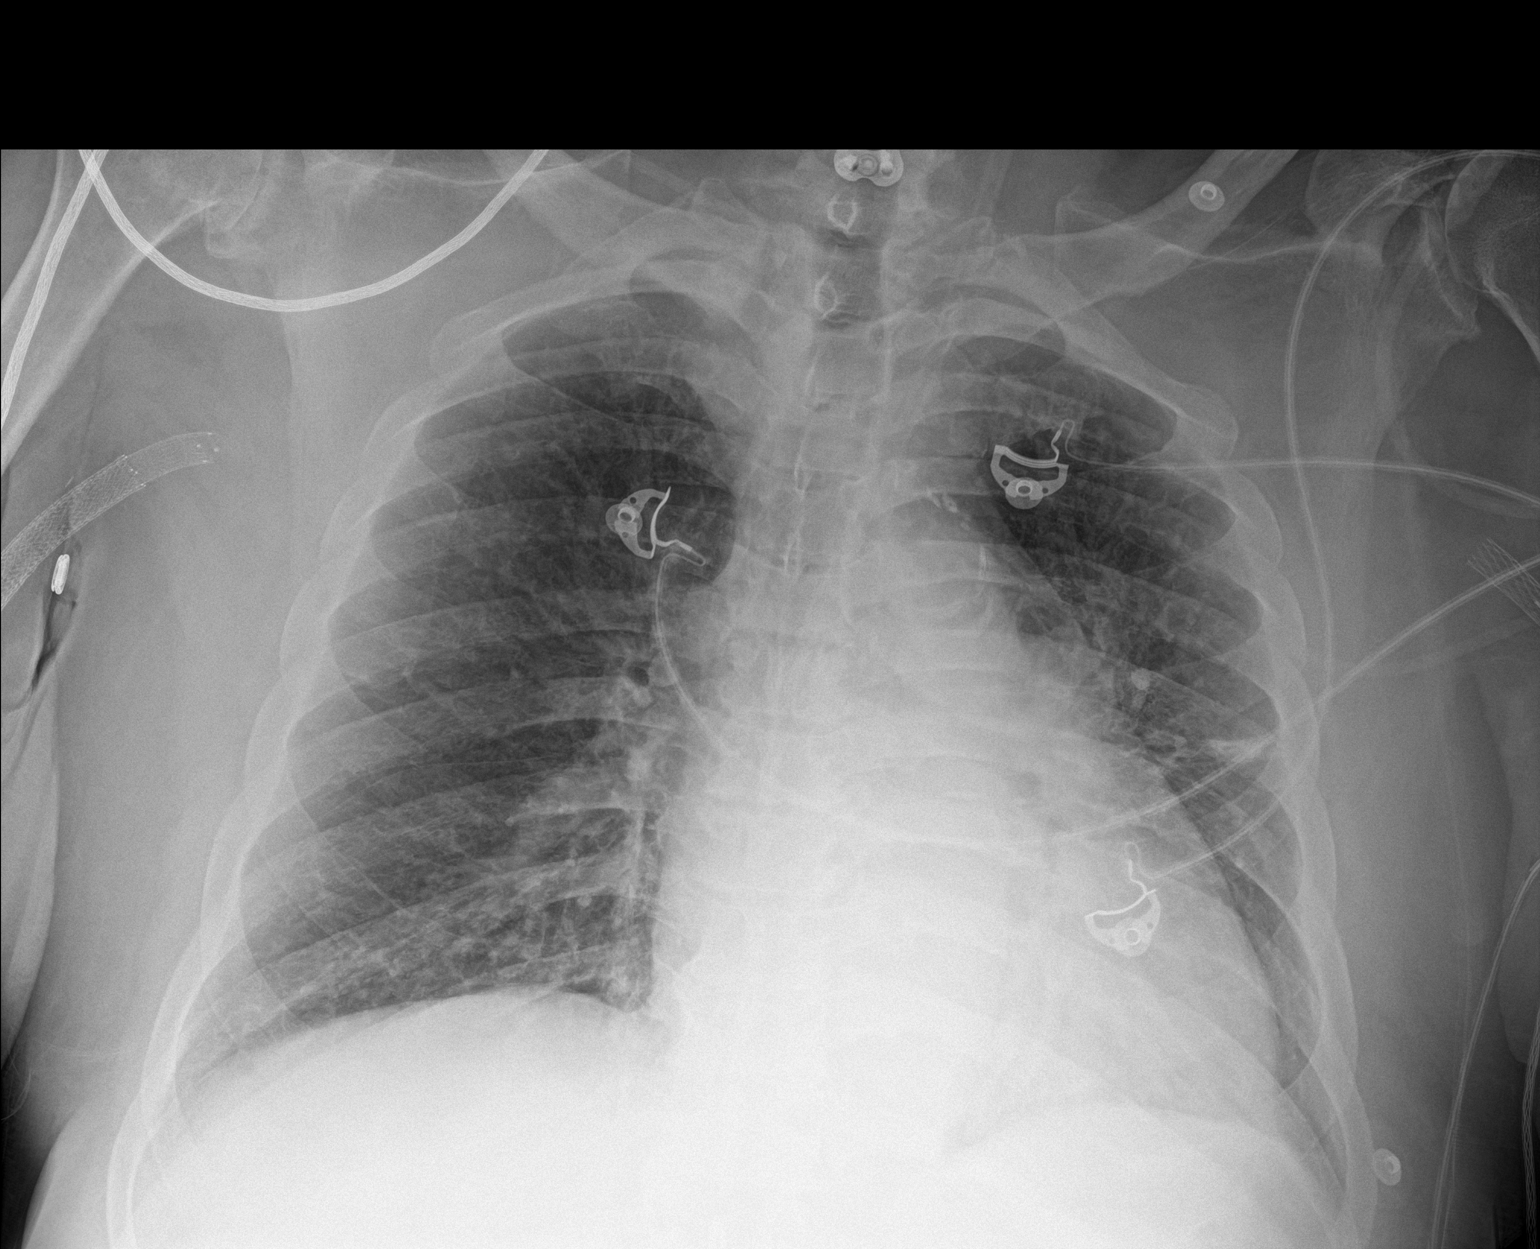

[1 of 1 positions shown; findings below may reference images not displayed]

FINDINGS: The cardiac silhouette is normal in size. No mediastinal or hilar
masses. No evidence of adenopathy. Mild linear scarring or chronic
atelectasis in the left mid lung. Lungs are otherwise clear. No
pleural effusion. No pneumothorax.

Right arm vascular stent is stable. There stable changes from a
previous cervical spine fusion.
IMPRESSION: No acute cardiopulmonary disease.

## 2018-03-10 NOTE — Telephone Encounter (Signed)
Ok to [employer] I think?

## 2018-03-11 DIAGNOSIS — D631 Anemia in chronic kidney disease: Secondary | ICD-10-CM | POA: Diagnosis not present

## 2018-03-11 DIAGNOSIS — E119 Type 2 diabetes mellitus without complications: Secondary | ICD-10-CM | POA: Diagnosis not present

## 2018-03-11 DIAGNOSIS — N2581 Secondary hyperparathyroidism of renal origin: Secondary | ICD-10-CM | POA: Diagnosis not present

## 2018-03-11 DIAGNOSIS — N186 End stage renal disease: Secondary | ICD-10-CM | POA: Diagnosis not present

## 2018-03-12 DIAGNOSIS — Z992 Dependence on renal dialysis: Secondary | ICD-10-CM | POA: Diagnosis not present

## 2018-03-12 DIAGNOSIS — N186 End stage renal disease: Secondary | ICD-10-CM | POA: Diagnosis not present

## 2018-03-13 DIAGNOSIS — E119 Type 2 diabetes mellitus without complications: Secondary | ICD-10-CM | POA: Diagnosis not present

## 2018-03-13 DIAGNOSIS — N2581 Secondary hyperparathyroidism of renal origin: Secondary | ICD-10-CM | POA: Diagnosis not present

## 2018-03-13 DIAGNOSIS — D631 Anemia in chronic kidney disease: Secondary | ICD-10-CM | POA: Diagnosis not present

## 2018-03-13 DIAGNOSIS — N186 End stage renal disease: Secondary | ICD-10-CM | POA: Diagnosis not present

## 2018-03-14 ENCOUNTER — Other Ambulatory Visit: Payer: Self-pay

## 2018-03-14 DIAGNOSIS — I871 Compression of vein: Secondary | ICD-10-CM

## 2018-03-14 DIAGNOSIS — N186 End stage renal disease: Secondary | ICD-10-CM

## 2018-03-15 DIAGNOSIS — E119 Type 2 diabetes mellitus without complications: Secondary | ICD-10-CM | POA: Diagnosis not present

## 2018-03-15 DIAGNOSIS — N186 End stage renal disease: Secondary | ICD-10-CM | POA: Diagnosis not present

## 2018-03-15 DIAGNOSIS — D631 Anemia in chronic kidney disease: Secondary | ICD-10-CM | POA: Diagnosis not present

## 2018-03-15 DIAGNOSIS — N2581 Secondary hyperparathyroidism of renal origin: Secondary | ICD-10-CM | POA: Diagnosis not present

## 2018-03-18 DIAGNOSIS — N2581 Secondary hyperparathyroidism of renal origin: Secondary | ICD-10-CM | POA: Diagnosis not present

## 2018-03-18 DIAGNOSIS — N186 End stage renal disease: Secondary | ICD-10-CM | POA: Diagnosis not present

## 2018-03-18 DIAGNOSIS — D631 Anemia in chronic kidney disease: Secondary | ICD-10-CM | POA: Diagnosis not present

## 2018-03-18 DIAGNOSIS — E119 Type 2 diabetes mellitus without complications: Secondary | ICD-10-CM | POA: Diagnosis not present

## 2018-03-20 DIAGNOSIS — N186 End stage renal disease: Secondary | ICD-10-CM | POA: Diagnosis not present

## 2018-03-20 DIAGNOSIS — D631 Anemia in chronic kidney disease: Secondary | ICD-10-CM | POA: Diagnosis not present

## 2018-03-20 DIAGNOSIS — E119 Type 2 diabetes mellitus without complications: Secondary | ICD-10-CM | POA: Diagnosis not present

## 2018-03-20 DIAGNOSIS — N2581 Secondary hyperparathyroidism of renal origin: Secondary | ICD-10-CM | POA: Diagnosis not present

## 2018-03-22 DIAGNOSIS — N186 End stage renal disease: Secondary | ICD-10-CM | POA: Diagnosis not present

## 2018-03-22 DIAGNOSIS — D631 Anemia in chronic kidney disease: Secondary | ICD-10-CM | POA: Diagnosis not present

## 2018-03-22 DIAGNOSIS — N2581 Secondary hyperparathyroidism of renal origin: Secondary | ICD-10-CM | POA: Diagnosis not present

## 2018-03-22 DIAGNOSIS — E119 Type 2 diabetes mellitus without complications: Secondary | ICD-10-CM | POA: Diagnosis not present

## 2018-03-25 ENCOUNTER — Other Ambulatory Visit: Payer: Self-pay | Admitting: Internal Medicine

## 2018-03-25 DIAGNOSIS — N2581 Secondary hyperparathyroidism of renal origin: Secondary | ICD-10-CM | POA: Diagnosis not present

## 2018-03-25 DIAGNOSIS — N186 End stage renal disease: Secondary | ICD-10-CM | POA: Diagnosis not present

## 2018-03-25 DIAGNOSIS — D631 Anemia in chronic kidney disease: Secondary | ICD-10-CM | POA: Diagnosis not present

## 2018-03-25 DIAGNOSIS — E119 Type 2 diabetes mellitus without complications: Secondary | ICD-10-CM | POA: Diagnosis not present

## 2018-03-27 ENCOUNTER — Other Ambulatory Visit: Payer: Self-pay | Admitting: Internal Medicine

## 2018-03-27 DIAGNOSIS — N186 End stage renal disease: Secondary | ICD-10-CM | POA: Diagnosis not present

## 2018-03-27 DIAGNOSIS — N2581 Secondary hyperparathyroidism of renal origin: Secondary | ICD-10-CM | POA: Diagnosis not present

## 2018-03-27 DIAGNOSIS — D631 Anemia in chronic kidney disease: Secondary | ICD-10-CM | POA: Diagnosis not present

## 2018-03-27 DIAGNOSIS — E119 Type 2 diabetes mellitus without complications: Secondary | ICD-10-CM | POA: Diagnosis not present

## 2018-03-27 NOTE — Telephone Encounter (Signed)
Done erx 

## 2018-03-29 DIAGNOSIS — N2581 Secondary hyperparathyroidism of renal origin: Secondary | ICD-10-CM | POA: Diagnosis not present

## 2018-03-29 DIAGNOSIS — E119 Type 2 diabetes mellitus without complications: Secondary | ICD-10-CM | POA: Diagnosis not present

## 2018-03-29 DIAGNOSIS — D631 Anemia in chronic kidney disease: Secondary | ICD-10-CM | POA: Diagnosis not present

## 2018-03-29 DIAGNOSIS — N186 End stage renal disease: Secondary | ICD-10-CM | POA: Diagnosis not present

## 2018-03-31 DIAGNOSIS — N186 End stage renal disease: Secondary | ICD-10-CM | POA: Diagnosis not present

## 2018-03-31 DIAGNOSIS — E1129 Type 2 diabetes mellitus with other diabetic kidney complication: Secondary | ICD-10-CM | POA: Diagnosis not present

## 2018-03-31 DIAGNOSIS — D631 Anemia in chronic kidney disease: Secondary | ICD-10-CM | POA: Diagnosis not present

## 2018-03-31 DIAGNOSIS — Z992 Dependence on renal dialysis: Secondary | ICD-10-CM | POA: Diagnosis not present

## 2018-03-31 DIAGNOSIS — E119 Type 2 diabetes mellitus without complications: Secondary | ICD-10-CM | POA: Diagnosis not present

## 2018-03-31 DIAGNOSIS — N2581 Secondary hyperparathyroidism of renal origin: Secondary | ICD-10-CM | POA: Diagnosis not present

## 2018-04-01 DIAGNOSIS — E119 Type 2 diabetes mellitus without complications: Secondary | ICD-10-CM | POA: Diagnosis not present

## 2018-04-01 DIAGNOSIS — N2581 Secondary hyperparathyroidism of renal origin: Secondary | ICD-10-CM | POA: Diagnosis not present

## 2018-04-01 DIAGNOSIS — D631 Anemia in chronic kidney disease: Secondary | ICD-10-CM | POA: Diagnosis not present

## 2018-04-01 DIAGNOSIS — N186 End stage renal disease: Secondary | ICD-10-CM | POA: Diagnosis not present

## 2018-04-03 DIAGNOSIS — N186 End stage renal disease: Secondary | ICD-10-CM | POA: Diagnosis not present

## 2018-04-03 DIAGNOSIS — E119 Type 2 diabetes mellitus without complications: Secondary | ICD-10-CM | POA: Diagnosis not present

## 2018-04-03 DIAGNOSIS — N2581 Secondary hyperparathyroidism of renal origin: Secondary | ICD-10-CM | POA: Diagnosis not present

## 2018-04-03 DIAGNOSIS — D631 Anemia in chronic kidney disease: Secondary | ICD-10-CM | POA: Diagnosis not present

## 2018-04-04 ENCOUNTER — Encounter: Payer: Self-pay | Admitting: Vascular Surgery

## 2018-04-04 ENCOUNTER — Encounter: Payer: Self-pay | Admitting: *Deleted

## 2018-04-04 ENCOUNTER — Other Ambulatory Visit: Payer: Self-pay | Admitting: *Deleted

## 2018-04-04 ENCOUNTER — Ambulatory Visit (HOSPITAL_COMMUNITY)
Admission: RE | Admit: 2018-04-04 | Discharge: 2018-04-04 | Disposition: A | Discharge status: Home / Self Care | Payer: Medicare Other | Source: Ambulatory Visit | Attending: Vascular Surgery | Admitting: Vascular Surgery

## 2018-04-04 ENCOUNTER — Other Ambulatory Visit: Payer: Self-pay

## 2018-04-04 ENCOUNTER — Ambulatory Visit (INDEPENDENT_AMBULATORY_CARE_PROVIDER_SITE_OTHER)
Admission: RE | Admit: 2018-04-04 | Discharge: 2018-04-04 | Disposition: A | Discharge status: Home / Self Care | Payer: Medicare Other | Source: Ambulatory Visit | Attending: Vascular Surgery | Admitting: Vascular Surgery

## 2018-04-04 ENCOUNTER — Ambulatory Visit (INDEPENDENT_AMBULATORY_CARE_PROVIDER_SITE_OTHER): Payer: Medicare Other | Admitting: Vascular Surgery

## 2018-04-04 VITALS — BP 147/86 | HR 79 | Temp 97.2°F | Resp 16 | Ht 73.0 in | Wt 224.0 lb

## 2018-04-04 DIAGNOSIS — N186 End stage renal disease: Secondary | ICD-10-CM

## 2018-04-04 DIAGNOSIS — I871 Compression of vein: Secondary | ICD-10-CM

## 2018-04-04 DIAGNOSIS — I2510 Atherosclerotic heart disease of native coronary artery without angina pectoris: Secondary | ICD-10-CM | POA: Diagnosis not present

## 2018-04-04 DIAGNOSIS — Z01818 Encounter for other preprocedural examination: Secondary | ICD-10-CM | POA: Insufficient documentation

## 2018-04-04 DIAGNOSIS — Z992 Dependence on renal dialysis: Secondary | ICD-10-CM

## 2018-04-04 NOTE — Progress Notes (Signed)
Patient is a 67 year old male referred for evaluation possible revision of her right basilic vein transposition fistula.  She was referred by Dr. Posey Pronto.  Apparently there have been multiple interventions on the venous outflow stenosis and apparently some type of stenting procedure done in the past.  Unfortunately records for this and imaging is not available today.  The patient does not know all of his exact history.  Currently the fistula is functioning.  He is also previous had a right forearm AV graft and a left upper arm AV access.  He denies any numbness or tingling in the right hand.  He states he is not on any anticoagulation.  His current dialysis today is Tuesday Thursday Saturday.  Past medical history is otherwise remarkable for peripheral neuropathy coronary artery disease congestive failure diabetes dementia all of which are currently stable.  Past Medical History:  Diagnosis Date  . Allergic rhinitis, cause unspecified 02/24/2014  . Anemia 06/16/2011  . BENIGN PROSTATIC HYPERTROPHY 10/14/2009  . CAD, NATIVE VESSEL 02/06/2009   a. 06/2007 s/p Taxus DES to the RCA;  b. 08/2016 NSTEMI in setting of SVT/PCI: LM 30ost, LAD 50m (3.0x16 Synergy DES), LCX 41m, OM1 60, RI 40, RCA 70p/m, 17m - not amenable to PCI.  [name] Cervical radiculopathy, chronic 02/23/2016   Right c5-6 by NCS/EMG  . Chronic combined systolic (congestive) and diastolic (congestive) heart failure ([place])    a. 10/2016 Echo: EF 40-45%, Gr2 DD. mildly dil LA.  [name] COLONIC POLYPS, HX OF 10/14/2009  . Dementia [id number]  . Depression 09/24/2015  . DIABETES MELLITUS, TYPE II 02/01/2010  . DYSLIPIDEMIA 06/18/2007  . ESRD (end stage renal disease) on dialysis ([place]) 08/04/2010   "TTS;  " (04/18/2015)  . FOOT PAIN 08/12/2008  . GAIT DISTURBANCE 03/03/2010  . GASTROENTERITIS, VIRAL 10/14/2009  . GERD 06/18/2007  . GOITER, MULTINODULAR 12/26/2007  . GOUT 06/18/2007  . GYNECOMASTIA 07/17/2010  . Hemodialysis access, fistula mature [facility])    Dialysis  [facility] ([place]) Right upper arm fistula  . Hyperlipidemia 10/16/2011  . Hyperparathyroidism, secondary ([place]) 06/16/2011  . HYPERTENSION 06/18/2007  . Hyperthyroidism   . Hypocalcemia 06/07/2010  . Ischemic cardiomyopathy    a. 10/2016 Echo: EF 40-45%.  . Lumbar stenosis with neurogenic claudication   . ONYCHOMYCOSIS, TOENAILS 12/26/2007  . OSA on CPAP 10/16/2011  . Other malaise and fatigue 11/24/2009  . PERIPHERAL NEUROPATHY 06/18/2007  . Prostate cancer ([place])   . PSVT (paroxysmal supraventricular tachycardia) ([place])    a. 15/4008 complicated by NSTEMI;  b. 11/2016 Treated w/ adenosine in ED;  c. 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  [name] PULMONARY NODULE, RIGHT LOWER LOBE 06/08/2009  . Sleep apnea    cpap machine and o2  . TRANSAMINASES, SERUM, ELEVATED 02/01/2010  . Transfusion history    none recent  . Unspecified hypotension 01/30/2010   Past Surgical History:  Procedure Laterality Date  . ARTERIOVENOUS GRAFT PLACEMENT Right 2009   forearm/notes 02/01/2011  . AV FISTULA PLACEMENT  11/07/2011   Procedure: INSERTION OF ARTERIOVENOUS (AV) GORE-TEX GRAFT ARM;  Surgeon: Tinnie Gens, MD;  Location: [place];  Service: Vascular;  Laterality: Left;  . BACK SURGERY  1998  . BASCILIC VEIN TRANSPOSITION Right 02/27/2013   Procedure: BASCILIC VEIN TRANSPOSITION;  Surgeon: Mal Misty, MD;  Location: [place];  Service: Vascular;  Laterality: Right;  Right Basilic Vein Transposition   . CARDIAC CATHETERIZATION N/A 08/06/2016   Procedure: Left Heart Cath and Coronary Angiography;  Surgeon: Shaune Pascal Bensimhon,  MD;  Location: [place] CV LAB;  Service: Cardiovascular;  Laterality: N/A;  . CARDIAC CATHETERIZATION N/A 08/07/2016   Procedure: Coronary/Graft Atherectomy-CSI LAD;  Surgeon: Peter M Martinique, MD;  Location: [place] CV LAB;  Service: Cardiovascular;  Laterality: N/A;  . CERVICAL SPINE SURGERY  2/09   "to repair nerve problems in my left arm"  . CHOLECYSTECTOMY    . COLONOSCOPY WITH PROPOFOL  N/A 04/26/2017   Procedure: COLONOSCOPY WITH PROPOFOL;  Surgeon: Otis Brace, MD;  Location: [place];  Service: Gastroenterology;  Laterality: N/A;  . CORONARY ANGIOPLASTY WITH STENT PLACEMENT  06/11/2008  . CORONARY ANGIOPLASTY WITH STENT PLACEMENT  06/2007   TAXUS stent to RCA/notes 01/31/2011  . ESOPHAGOGASTRODUODENOSCOPY  09/28/2011   Procedure: ESOPHAGOGASTRODUODENOSCOPY (EGD);  Surgeon: Missy Sabins, MD;  Location: [facility]COPY;  Service: Endoscopy;  Laterality: N/A;  . ESOPHAGOGASTRODUODENOSCOPY N/A 04/07/2015   Procedure: ESOPHAGOGASTRODUODENOSCOPY (EGD);  Surgeon: Teena Irani, MD;  Location: [facility];  Service: Endoscopy;  Laterality: N/A;  . ESOPHAGOGASTRODUODENOSCOPY N/A 04/19/2015   Procedure: ESOPHAGOGASTRODUODENOSCOPY (EGD);  Surgeon: Arta Silence, MD;  Location: [facility] ENDOSCOPY;  Service: Endoscopy;  Laterality: N/A;  . FLEXIBLE SIGMOIDOSCOPY N/A 05/21/2017   Procedure: Conni Elliot;  Surgeon: Clarene Essex, MD;  Location: [place];  Service: Endoscopy;  Laterality: N/A;  . FLEXIBLE SIGMOIDOSCOPY Left 07/02/2017   Procedure: FLEXIBLE SIGMOIDOSCOPY;  Surgeon: Laurence Spates, MD;  Location: [place];  Service: Endoscopy;  Laterality: Left;  . FOREIGN BODY REMOVAL  09/2003   via upper endoscopy/notes 02/12/2011  . GIVENS CAPSULE STUDY  09/30/2011   Procedure: GIVENS CAPSULE STUDY;  Surgeon: Jeryl Columbia, MD;  Location: [facility] ENDOSCOPY;  Service: Endoscopy;  Laterality: N/A;  . INSERTION OF DIALYSIS CATHETER Right 2014  . INSERTION OF DIALYSIS CATHETER Left 02/11/2013   Procedure: INSERTION OF DIALYSIS CATHETER;  Surgeon: Conrad [place], MD;  Location: [place];  Service: Vascular;  Laterality: Left;  Ultrasound guided  . LUMBAR LAMINECTOMY/DECOMPRESSION MICRODISCECTOMY Bilateral 07/31/2017   Procedure: LAMINECTOMY AND FORAMINOTOMY- BILATERAL LUMBAR TWO- LUMBAR THREE;  Surgeon: Earnie Larsson, MD;  Location: [place];  Service: Neurosurgery;  Laterality: Bilateral;   LAMINECTOMY AND FORAMINOTOMY- BILATERAL LUMBAR 2- LUMBAR 3  . REMOVAL OF A DIALYSIS CATHETER Right 02/11/2013   Procedure: REMOVAL OF A DIALYSIS CATHETER;  Surgeon: Conrad [place], MD;  Location: [place];  Service: Vascular;  Laterality: Right;  . SAVORY DILATION N/A 04/07/2015   Procedure: SAVORY DILATION;  Surgeon: Teena Irani, MD;  Location: [facility] ENDOSCOPY;  Service: Endoscopy;  Laterality: N/A;  . SHUNTOGRAM N/A 09/20/2011   Procedure: Earney Mallet;  Surgeon: Conrad Ken Caryl, MD;  Location: [facility] CATH LAB;  Service: Cardiovascular;  Laterality: N/A;  . SVT ABLATION N/A 11/26/2016   Procedure: SVT Ablation;  Surgeon: Will Meredith Leeds, MD;  Location: [place] CV LAB;  Service: Cardiovascular;  Laterality: N/A;  . TONSILLECTOMY    . TOTAL KNEE ARTHROPLASTY Right 08/02/2015   Procedure: TOTAL KNEE ARTHROPLASTY;  Surgeon: Renette Butters, MD;  Location: [place];  Service: Orthopedics;  Laterality: Right;  . VENOGRAM N/A 01/26/2013   Procedure: VENOGRAM;  Surgeon: Angelia Mould, MD;  Location: [facility] CATH LAB;  Service: Cardiovascular;  Laterality: N/A;    Current Outpatient Medications on File Prior to Visit  Medication Sig Dispense Refill  . acetaminophen (TYLENOL) 325 MG tablet Take 650 mg by mouth every 6 (six) hours as needed for mild pain or headache.     . Alirocumab (PRALUENT) 150 MG/ML SOPN Inject 150 mg  into the skin every 14 (fourteen) days. 2 pen 12  . allopurinol (ZYLOPRIM) 100 MG tablet TAKE ONE TABLET BY MOUTH ONCE DAILY 90 tablet 3  . aspirin EC 81 MG tablet Take 81 mg by mouth daily.    . citalopram (CELEXA) 10 MG tablet Take 1 tablet (10 mg total) by mouth at bedtime. 90 tablet 3  . ezetimibe (ZETIA) 10 MG tablet TAKE ONE TABLET BY MOUTH ONCE DAILY 90 tablet 3  . ferric citrate (AURYXIA) 1 GM 210 MG(Fe) tablet Take 420 mg by mouth 3 (three) times daily with meals.    . gabapentin (NEURONTIN) 300 MG capsule Take 1 capsule (300 mg total) by mouth at bedtime. (Patient taking differently:  Take 600 mg by mouth at bedtime. )    . hydroxypropyl methylcellulose / hypromellose (ISOPTO TEARS / GONIOVISC) 2.5 % ophthalmic solution Place 1 drop into both eyes 3 (three) times daily as needed for dry eyes. 15 mL 11  . midodrine (PROAMATINE) 10 MG tablet Take 1 tablet (10 mg total) by mouth See admin instructions. Once a day only on dialysis days (Tues/Thurs/Sat) 30 tablet 3  . multivitamin (RENA-VIT) TABS tablet Take 1 tablet by mouth daily. Reported on 10/24/2015    . pantoprazole (PROTONIX) 40 MG tablet Take 40 mg by mouth every evening.     . polyethylene glycol (MIRALAX / GLYCOLAX) packet Take 17 g by mouth daily. (Patient taking differently: Take 17 g by mouth daily as needed for mild constipation. ) 14 each 0  . SENSIPAR 90 MG tablet Take 90 mg by mouth 2 (two) times daily.    . WELCHOL 625 MG tablet TAKE THREE TABLETS BY MOUTH TWICE DAILY WITH MEALS 180 tablet 11  . zolpidem (AMBIEN) 5 MG tablet TAKE 1 TABLET BY MOUTH AT BEDTIME AS NEEDED FOR SLEEP 90 tablet 1   No current facility-administered medications on file prior to visit.     Physical exam:  Vitals:   04/04/18 1319  BP: (!) 147/86  Pulse: 79  Resp: 16  Temp: (!) 97.2 F (36.2 C)  TempSrc: Oral  SpO2: 97%  Weight: 224 lb (101.6 kg)  Height: 6\' 1"  (1.854 m)    Right upper extremity: Palpable thrill audible bruit in right upper arm AV fistula.,  Thrombosed right forearm and left upper arm access.  1+ right radial pulse  Chest: Clear to auscultation bilaterally  Cardiac: Regular rate and rhythm  Data: Patient had a venous duplex of the upper extremities today which showed marginal vein bilaterally less than 2 mm.  He also had an arterial duplex which showed triphasic waveforms and essentially normal arterial anatomy of the upper extremities.  Assessment: Poorly functioning AV fistula right arm.  Since we do not have imaging available today the patient has had apparently apparent the extensive stenting of the AV  access in the past.  We will schedule him for a right arm shuntogram for operative planning.  This is scheduled with my partner Dr. Donzetta Matters on Monday, April 07, 2018.  Risk benefits possible complications and procedure details were discussed with patient and his wife today.  They include but not limited to bleeding infection access thrombosis.  I also discussed with him today that the revision would occur after the shuntogram to make sure that we have enough room to revise this access in the axilla.  If not he may need a central venogram on the left side to consider going back to the left arm or more likely consideration  for a thigh graft since he has thrombosed the left upper arm access in the past.  Plan: See above  Ruta Hinds, MD [facility] Office: [phone] Pager: [phone]

## 2018-04-05 DIAGNOSIS — N186 End stage renal disease: Secondary | ICD-10-CM | POA: Diagnosis not present

## 2018-04-05 DIAGNOSIS — E119 Type 2 diabetes mellitus without complications: Secondary | ICD-10-CM | POA: Diagnosis not present

## 2018-04-05 DIAGNOSIS — N2581 Secondary hyperparathyroidism of renal origin: Secondary | ICD-10-CM | POA: Diagnosis not present

## 2018-04-05 DIAGNOSIS — D631 Anemia in chronic kidney disease: Secondary | ICD-10-CM | POA: Diagnosis not present

## 2018-04-07 ENCOUNTER — Encounter: Payer: Self-pay | Admitting: Nephrology

## 2018-04-07 ENCOUNTER — Telehealth: Payer: Self-pay | Admitting: Vascular Surgery

## 2018-04-07 ENCOUNTER — Ambulatory Visit (HOSPITAL_COMMUNITY)
Admission: RE | Admit: 2018-04-07 | Discharge: 2018-04-07 | Disposition: A | Discharge status: Home / Self Care | Payer: Medicare Other | Source: Ambulatory Visit | Attending: Vascular Surgery | Admitting: Vascular Surgery

## 2018-04-07 ENCOUNTER — Encounter (HOSPITAL_COMMUNITY): Payer: Self-pay | Admitting: Vascular Surgery

## 2018-04-07 ENCOUNTER — Encounter (HOSPITAL_COMMUNITY)
Admission: RE | Disposition: A | Discharge status: Home / Self Care | Payer: Self-pay | Source: Ambulatory Visit | Attending: Vascular Surgery

## 2018-04-07 DIAGNOSIS — N186 End stage renal disease: Secondary | ICD-10-CM

## 2018-04-07 DIAGNOSIS — Z79899 Other long term (current) drug therapy: Secondary | ICD-10-CM | POA: Insufficient documentation

## 2018-04-07 DIAGNOSIS — Z86010 Personal history of colonic polyps: Secondary | ICD-10-CM | POA: Insufficient documentation

## 2018-04-07 DIAGNOSIS — T82858A Stenosis of vascular prosthetic devices, implants and grafts, initial encounter: Secondary | ICD-10-CM | POA: Diagnosis not present

## 2018-04-07 DIAGNOSIS — G4733 Obstructive sleep apnea (adult) (pediatric): Secondary | ICD-10-CM | POA: Diagnosis not present

## 2018-04-07 DIAGNOSIS — E1142 Type 2 diabetes mellitus with diabetic polyneuropathy: Secondary | ICD-10-CM | POA: Diagnosis not present

## 2018-04-07 DIAGNOSIS — Z992 Dependence on renal dialysis: Secondary | ICD-10-CM | POA: Diagnosis not present

## 2018-04-07 DIAGNOSIS — Y832 Surgical operation with anastomosis, bypass or graft as the cause of abnormal reaction of the patient, or of later complication, without mention of misadventure at the time of the procedure: Secondary | ICD-10-CM | POA: Diagnosis not present

## 2018-04-07 DIAGNOSIS — N400 Enlarged prostate without lower urinary tract symptoms: Secondary | ICD-10-CM | POA: Diagnosis not present

## 2018-04-07 DIAGNOSIS — Z7982 Long term (current) use of aspirin: Secondary | ICD-10-CM | POA: Diagnosis not present

## 2018-04-07 DIAGNOSIS — Z8546 Personal history of malignant neoplasm of prostate: Secondary | ICD-10-CM | POA: Diagnosis not present

## 2018-04-07 DIAGNOSIS — E785 Hyperlipidemia, unspecified: Secondary | ICD-10-CM | POA: Insufficient documentation

## 2018-04-07 DIAGNOSIS — E1122 Type 2 diabetes mellitus with diabetic chronic kidney disease: Secondary | ICD-10-CM | POA: Diagnosis not present

## 2018-04-07 DIAGNOSIS — Z9889 Other specified postprocedural states: Secondary | ICD-10-CM | POA: Diagnosis not present

## 2018-04-07 DIAGNOSIS — Z96651 Presence of right artificial knee joint: Secondary | ICD-10-CM | POA: Insufficient documentation

## 2018-04-07 DIAGNOSIS — I5042 Chronic combined systolic (congestive) and diastolic (congestive) heart failure: Secondary | ICD-10-CM | POA: Insufficient documentation

## 2018-04-07 DIAGNOSIS — Z955 Presence of coronary angioplasty implant and graft: Secondary | ICD-10-CM | POA: Diagnosis not present

## 2018-04-07 DIAGNOSIS — T82898A Other specified complication of vascular prosthetic devices, implants and grafts, initial encounter: Secondary | ICD-10-CM | POA: Diagnosis not present

## 2018-04-07 DIAGNOSIS — K219 Gastro-esophageal reflux disease without esophagitis: Secondary | ICD-10-CM | POA: Insufficient documentation

## 2018-04-07 DIAGNOSIS — E039 Hypothyroidism, unspecified: Secondary | ICD-10-CM | POA: Diagnosis not present

## 2018-04-07 DIAGNOSIS — Z9049 Acquired absence of other specified parts of digestive tract: Secondary | ICD-10-CM | POA: Diagnosis not present

## 2018-04-07 DIAGNOSIS — I132 Hypertensive heart and chronic kidney disease with heart failure and with stage 5 chronic kidney disease, or end stage renal disease: Secondary | ICD-10-CM | POA: Diagnosis not present

## 2018-04-07 HISTORY — PX: A/V SHUNTOGRAM: CATH118297

## 2018-04-07 HISTORY — PX: PERIPHERAL VASCULAR BALLOON ANGIOPLASTY: CATH118281

## 2018-04-07 LAB — POCT I-STAT, CHEM 8
BUN: 56 mg/dL — AB (ref 8–23)
CHLORIDE: 103 mmol/L (ref 98–111)
Calcium, Ion: 0.98 mmol/L — ABNORMAL LOW (ref 1.15–1.40)
Creatinine, Ser: 10.4 mg/dL — ABNORMAL HIGH (ref 0.61–1.24)
GLUCOSE: 159 mg/dL — AB (ref 70–99)
HCT: 38 % — ABNORMAL LOW (ref 39.0–52.0)
Hemoglobin: 12.9 g/dL — ABNORMAL LOW (ref 13.0–17.0)
Potassium: 5.1 mmol/L (ref 3.5–5.1)
Sodium: 141 mmol/L (ref 135–145)
TCO2: 29 mmol/L (ref 22–32)

## 2018-04-07 LAB — GLUCOSE, CAPILLARY: GLUCOSE-CAPILLARY: 171 mg/dL — AB (ref 70–99)

## 2018-04-07 SURGERY — A/V SHUNTOGRAM
Anesthesia: LOCAL

## 2018-04-07 MED ORDER — FENTANYL CITRATE (PF) 100 MCG/2ML IJ SOLN
INTRAMUSCULAR | Status: AC
  Filled 2018-04-07: qty 2

## 2018-04-07 MED ORDER — SODIUM CHLORIDE 0.9 % IV SOLN: 250.0000 mL | INTRAVENOUS | Status: DC | PRN

## 2018-04-07 MED ORDER — LIDOCAINE HCL (PF) 1 % IJ SOLN
INTRAMUSCULAR | Status: DC | PRN
  Administered 2018-04-07: 2 mL

## 2018-04-07 MED ORDER — SODIUM CHLORIDE 0.9% FLUSH: 3.0000 mL | INTRAVENOUS | Status: DC | PRN

## 2018-04-07 MED ORDER — LIDOCAINE HCL (PF) 1 % IJ SOLN
INTRAMUSCULAR | Status: AC
  Filled 2018-04-07: qty 30

## 2018-04-07 MED ORDER — SODIUM CHLORIDE 0.9% FLUSH: 3.0000 mL | Freq: Two times a day (BID) | INTRAVENOUS | Status: DC

## 2018-04-07 MED ORDER — FENTANYL CITRATE (PF) 100 MCG/2ML IJ SOLN
INTRAMUSCULAR | Status: DC | PRN
  Administered 2018-04-07: 25 ug via INTRAVENOUS

## 2018-04-07 MED ORDER — IODIXANOL 320 MG/ML IV SOLN
INTRAVENOUS | Status: DC | PRN
  Administered 2018-04-07: 35 mL

## 2018-04-07 MED ORDER — HEPARIN (PORCINE) IN NACL 1000-0.9 UT/500ML-% IV SOLN
INTRAVENOUS | Status: AC
  Filled 2018-04-07: qty 500

## 2018-04-07 MED ORDER — HEPARIN (PORCINE) IN NACL 2-0.9 UNITS/ML
INTRAMUSCULAR | Status: AC | PRN
  Administered 2018-04-07: 500 mL

## 2018-04-07 SURGICAL SUPPLY — 17 items
BAG SNAP BAND KOVER 36X36 (MISCELLANEOUS) ×3 IMPLANT
BALLN MUSTANG 10X60X75 (BALLOONS) ×3
BALLN MUSTANG 8X60X75 (BALLOONS) ×3
BALLOON MUSTANG 10X60X75 (BALLOONS) ×2 IMPLANT
BALLOON MUSTANG 8X60X75 (BALLOONS) ×2 IMPLANT
COVER DOME SNAP 22 D (MISCELLANEOUS) ×3 IMPLANT
COVER PRB 48X5XTLSCP FOLD TPE (BAG) ×2 IMPLANT
COVER PROBE 5X48 (BAG) ×1
KIT ENCORE 26 ADVANTAGE (KITS) ×3 IMPLANT
KIT MICROPUNCTURE NIT STIFF (SHEATH) ×3 IMPLANT
PROTECTION STATION PRESSURIZED (MISCELLANEOUS) ×3
SHEATH PINNACLE R/O II 6F 4CM (SHEATH) ×3 IMPLANT
STATION PROTECTION PRESSURIZED (MISCELLANEOUS) ×2 IMPLANT
STOPCOCK MORSE 400PSI 3WAY (MISCELLANEOUS) ×3 IMPLANT
TRAY PV CATH (CUSTOM PROCEDURE TRAY) ×3 IMPLANT
TUBING CIL FLEX 10 FLL-RA (TUBING) ×3 IMPLANT
WIRE BENTSON .035X145CM (WIRE) ×3 IMPLANT

## 2018-04-07 NOTE — H&P (Signed)
   History and Physical Update  The patient was interviewed and re-examined.  The patient's previous History and Physical has been reviewed and is unchanged from recent office visit with Dr. Oneida Alar. Plan for right arm fistulogram with possible intervention.  Gale Hulse C. Donzetta Matters, MD [facility] [place] Office: [phone] Pager: [phone]   04/07/2018, 8:22 AM

## 2018-04-07 NOTE — Discharge Instructions (Signed)

## 2018-04-07 NOTE — Op Note (Signed)
    Patient name: [name] MRN: [id number] DOB: 24-Nov-1950 Sex: male  04/07/2018 Pre-operative Diagnosis: End-stage renal disease, malfunction right arm AV fistula Post-operative diagnosis:  Same Surgeon:  Eda Paschal. Donzetta Matters, MD Procedure Performed: 1.  Ultrasound-guided cannulation right arm AV fistula 2.  Right upper extremity fistulogram 3.  Balloon angioplasty of right arm fistula as well as innominate vein with 10 mm balloon  Indications: 67 year old male with end-stage renal disease has multiple previous access procedures in the right upper extremity now indicated for diagnostic angiogram for possible surgical revision  Findings: There is a stenosis greater than 70% in the stent outflow in the axillary vein that was reduced to less than 25% after balloon angioplasty.  There is also a tight stenosis 80% down to 50% in the innominate vein.  He has no surgical revision options of the right upper extremity.  He is to continue with the right upper extremity he may be looking at a hero graft in the future given that there is thrombus in the fistula near the arterial anastomosis as well.  Not sure that he has further upper extremity options and if this fistula fails he will need consideration of thigh graft.   Procedure:  The patient was identified in the holding area and taken to room 8.  The patient was then placed supine on the table and prepped and draped in the usual sterile fashion.  A time out was called.  Ultrasound was used to evaluate the right arm AV fistula I could not identify some stents and I want to stick this more towards the arterial anastomosis but there was some thrombus in the fistula there.  I used ultrasound guidance and confirmed patency and then I numbed the area with 1% lidocaine.  I directly cannulated under ultrasound guidance with micropuncture needle placed a wire and sheath.  An image was saved to the permanent record.  I then performed right upper extremity fistulogram  with the above findings.  I then exchanged for a 6 French sheath over a Bentson wire was able to direct the Bentson into the right atrium.  I then performed balloon Angie plasty first with 8 mm balloon of the stent outflow in the innominate vein followed by 10 mm balloons held for 2 minutes. At completion we had resolution of our stenoses.  Satisfied I then placed a 4-0 Monocryl remove the sheath and wire.    Patient can continue to use this fistula but may need consideration of thigh graft future.  Contrast:35cc    Jermon Chalfant C. Donzetta Matters, MD [facility] [place] Office: [phone] Pager: [phone]

## 2018-04-07 NOTE — Telephone Encounter (Signed)
sch appt lvm 05/09/18 330pm ABI 4pm p/o MD

## 2018-04-08 ENCOUNTER — Other Ambulatory Visit: Payer: Self-pay

## 2018-04-08 DIAGNOSIS — N186 End stage renal disease: Secondary | ICD-10-CM | POA: Diagnosis not present

## 2018-04-08 DIAGNOSIS — I871 Compression of vein: Secondary | ICD-10-CM

## 2018-04-08 DIAGNOSIS — D631 Anemia in chronic kidney disease: Secondary | ICD-10-CM | POA: Diagnosis not present

## 2018-04-08 DIAGNOSIS — E119 Type 2 diabetes mellitus without complications: Secondary | ICD-10-CM | POA: Diagnosis not present

## 2018-04-08 DIAGNOSIS — N2581 Secondary hyperparathyroidism of renal origin: Secondary | ICD-10-CM | POA: Diagnosis not present

## 2018-04-08 DIAGNOSIS — Z992 Dependence on renal dialysis: Secondary | ICD-10-CM

## 2018-04-10 DIAGNOSIS — D631 Anemia in chronic kidney disease: Secondary | ICD-10-CM | POA: Diagnosis not present

## 2018-04-10 DIAGNOSIS — N2581 Secondary hyperparathyroidism of renal origin: Secondary | ICD-10-CM | POA: Diagnosis not present

## 2018-04-10 DIAGNOSIS — E119 Type 2 diabetes mellitus without complications: Secondary | ICD-10-CM | POA: Diagnosis not present

## 2018-04-10 DIAGNOSIS — N186 End stage renal disease: Secondary | ICD-10-CM | POA: Diagnosis not present

## 2018-04-12 DIAGNOSIS — D631 Anemia in chronic kidney disease: Secondary | ICD-10-CM | POA: Diagnosis not present

## 2018-04-12 DIAGNOSIS — N186 End stage renal disease: Secondary | ICD-10-CM | POA: Diagnosis not present

## 2018-04-12 DIAGNOSIS — N2581 Secondary hyperparathyroidism of renal origin: Secondary | ICD-10-CM | POA: Diagnosis not present

## 2018-04-12 DIAGNOSIS — E119 Type 2 diabetes mellitus without complications: Secondary | ICD-10-CM | POA: Diagnosis not present

## 2018-04-15 DIAGNOSIS — E119 Type 2 diabetes mellitus without complications: Secondary | ICD-10-CM | POA: Diagnosis not present

## 2018-04-15 DIAGNOSIS — N2581 Secondary hyperparathyroidism of renal origin: Secondary | ICD-10-CM | POA: Diagnosis not present

## 2018-04-15 DIAGNOSIS — D631 Anemia in chronic kidney disease: Secondary | ICD-10-CM | POA: Diagnosis not present

## 2018-04-15 DIAGNOSIS — N186 End stage renal disease: Secondary | ICD-10-CM | POA: Diagnosis not present

## 2018-04-17 DIAGNOSIS — D631 Anemia in chronic kidney disease: Secondary | ICD-10-CM | POA: Diagnosis not present

## 2018-04-17 DIAGNOSIS — N2581 Secondary hyperparathyroidism of renal origin: Secondary | ICD-10-CM | POA: Diagnosis not present

## 2018-04-17 DIAGNOSIS — E119 Type 2 diabetes mellitus without complications: Secondary | ICD-10-CM | POA: Diagnosis not present

## 2018-04-17 DIAGNOSIS — N186 End stage renal disease: Secondary | ICD-10-CM | POA: Diagnosis not present

## 2018-04-19 DIAGNOSIS — N186 End stage renal disease: Secondary | ICD-10-CM | POA: Diagnosis not present

## 2018-04-19 DIAGNOSIS — E119 Type 2 diabetes mellitus without complications: Secondary | ICD-10-CM | POA: Diagnosis not present

## 2018-04-19 DIAGNOSIS — N2581 Secondary hyperparathyroidism of renal origin: Secondary | ICD-10-CM | POA: Diagnosis not present

## 2018-04-19 DIAGNOSIS — D631 Anemia in chronic kidney disease: Secondary | ICD-10-CM | POA: Diagnosis not present

## 2018-04-22 DIAGNOSIS — N186 End stage renal disease: Secondary | ICD-10-CM | POA: Diagnosis not present

## 2018-04-22 DIAGNOSIS — N2581 Secondary hyperparathyroidism of renal origin: Secondary | ICD-10-CM | POA: Diagnosis not present

## 2018-04-22 DIAGNOSIS — E119 Type 2 diabetes mellitus without complications: Secondary | ICD-10-CM | POA: Diagnosis not present

## 2018-04-22 DIAGNOSIS — D631 Anemia in chronic kidney disease: Secondary | ICD-10-CM | POA: Diagnosis not present

## 2018-04-24 DIAGNOSIS — D631 Anemia in chronic kidney disease: Secondary | ICD-10-CM | POA: Diagnosis not present

## 2018-04-24 DIAGNOSIS — N186 End stage renal disease: Secondary | ICD-10-CM | POA: Diagnosis not present

## 2018-04-24 DIAGNOSIS — E119 Type 2 diabetes mellitus without complications: Secondary | ICD-10-CM | POA: Diagnosis not present

## 2018-04-24 DIAGNOSIS — N2581 Secondary hyperparathyroidism of renal origin: Secondary | ICD-10-CM | POA: Diagnosis not present

## 2018-04-26 DIAGNOSIS — D631 Anemia in chronic kidney disease: Secondary | ICD-10-CM | POA: Diagnosis not present

## 2018-04-26 DIAGNOSIS — E119 Type 2 diabetes mellitus without complications: Secondary | ICD-10-CM | POA: Diagnosis not present

## 2018-04-26 DIAGNOSIS — N2581 Secondary hyperparathyroidism of renal origin: Secondary | ICD-10-CM | POA: Diagnosis not present

## 2018-04-26 DIAGNOSIS — N186 End stage renal disease: Secondary | ICD-10-CM | POA: Diagnosis not present

## 2018-04-29 DIAGNOSIS — D631 Anemia in chronic kidney disease: Secondary | ICD-10-CM | POA: Diagnosis not present

## 2018-04-29 DIAGNOSIS — N2581 Secondary hyperparathyroidism of renal origin: Secondary | ICD-10-CM | POA: Diagnosis not present

## 2018-04-29 DIAGNOSIS — N186 End stage renal disease: Secondary | ICD-10-CM | POA: Diagnosis not present

## 2018-04-29 DIAGNOSIS — E119 Type 2 diabetes mellitus without complications: Secondary | ICD-10-CM | POA: Diagnosis not present

## 2018-04-30 DIAGNOSIS — L84 Corns and callosities: Secondary | ICD-10-CM | POA: Diagnosis not present

## 2018-04-30 DIAGNOSIS — L602 Onychogryphosis: Secondary | ICD-10-CM | POA: Diagnosis not present

## 2018-04-30 DIAGNOSIS — E1351 Other specified diabetes mellitus with diabetic peripheral angiopathy without gangrene: Secondary | ICD-10-CM | POA: Diagnosis not present

## 2018-05-01 DIAGNOSIS — N186 End stage renal disease: Secondary | ICD-10-CM | POA: Diagnosis not present

## 2018-05-01 DIAGNOSIS — D631 Anemia in chronic kidney disease: Secondary | ICD-10-CM | POA: Diagnosis not present

## 2018-05-01 DIAGNOSIS — Z992 Dependence on renal dialysis: Secondary | ICD-10-CM | POA: Diagnosis not present

## 2018-05-01 DIAGNOSIS — N2581 Secondary hyperparathyroidism of renal origin: Secondary | ICD-10-CM | POA: Diagnosis not present

## 2018-05-01 DIAGNOSIS — E1129 Type 2 diabetes mellitus with other diabetic kidney complication: Secondary | ICD-10-CM | POA: Diagnosis not present

## 2018-05-01 DIAGNOSIS — E119 Type 2 diabetes mellitus without complications: Secondary | ICD-10-CM | POA: Diagnosis not present

## 2018-05-03 DIAGNOSIS — N2581 Secondary hyperparathyroidism of renal origin: Secondary | ICD-10-CM | POA: Diagnosis not present

## 2018-05-03 DIAGNOSIS — E119 Type 2 diabetes mellitus without complications: Secondary | ICD-10-CM | POA: Diagnosis not present

## 2018-05-03 DIAGNOSIS — N186 End stage renal disease: Secondary | ICD-10-CM | POA: Diagnosis not present

## 2018-05-03 DIAGNOSIS — D631 Anemia in chronic kidney disease: Secondary | ICD-10-CM | POA: Diagnosis not present

## 2018-05-06 DIAGNOSIS — E119 Type 2 diabetes mellitus without complications: Secondary | ICD-10-CM | POA: Diagnosis not present

## 2018-05-06 DIAGNOSIS — N2581 Secondary hyperparathyroidism of renal origin: Secondary | ICD-10-CM | POA: Diagnosis not present

## 2018-05-06 DIAGNOSIS — N186 End stage renal disease: Secondary | ICD-10-CM | POA: Diagnosis not present

## 2018-05-06 DIAGNOSIS — D631 Anemia in chronic kidney disease: Secondary | ICD-10-CM | POA: Diagnosis not present

## 2018-05-07 ENCOUNTER — Ambulatory Visit (HOSPITAL_COMMUNITY)
Admission: RE | Admit: 2018-05-07 | Discharge: 2018-05-07 | Disposition: A | Discharge status: Home / Self Care | Payer: Medicare Other | Source: Ambulatory Visit | Attending: Vascular Surgery | Admitting: Vascular Surgery

## 2018-05-07 DIAGNOSIS — N186 End stage renal disease: Secondary | ICD-10-CM | POA: Diagnosis not present

## 2018-05-07 DIAGNOSIS — I2510 Atherosclerotic heart disease of native coronary artery without angina pectoris: Secondary | ICD-10-CM | POA: Diagnosis not present

## 2018-05-07 DIAGNOSIS — I871 Compression of vein: Secondary | ICD-10-CM | POA: Diagnosis not present

## 2018-05-07 DIAGNOSIS — R9389 Abnormal findings on diagnostic imaging of other specified body structures: Secondary | ICD-10-CM | POA: Insufficient documentation

## 2018-05-07 DIAGNOSIS — R0989 Other specified symptoms and signs involving the circulatory and respiratory systems: Secondary | ICD-10-CM | POA: Diagnosis present

## 2018-05-07 DIAGNOSIS — I120 Hypertensive chronic kidney disease with stage 5 chronic kidney disease or end stage renal disease: Secondary | ICD-10-CM | POA: Insufficient documentation

## 2018-05-07 DIAGNOSIS — Z992 Dependence on renal dialysis: Secondary | ICD-10-CM

## 2018-05-07 DIAGNOSIS — Z87891 Personal history of nicotine dependence: Secondary | ICD-10-CM | POA: Diagnosis not present

## 2018-05-08 ENCOUNTER — Encounter (HOSPITAL_COMMUNITY): Payer: Medicare Other

## 2018-05-08 DIAGNOSIS — D631 Anemia in chronic kidney disease: Secondary | ICD-10-CM | POA: Diagnosis not present

## 2018-05-08 DIAGNOSIS — E119 Type 2 diabetes mellitus without complications: Secondary | ICD-10-CM | POA: Diagnosis not present

## 2018-05-08 DIAGNOSIS — N186 End stage renal disease: Secondary | ICD-10-CM | POA: Diagnosis not present

## 2018-05-08 DIAGNOSIS — N2581 Secondary hyperparathyroidism of renal origin: Secondary | ICD-10-CM | POA: Diagnosis not present

## 2018-05-09 ENCOUNTER — Encounter: Payer: Self-pay | Admitting: Vascular Surgery

## 2018-05-09 ENCOUNTER — Other Ambulatory Visit: Payer: Self-pay

## 2018-05-09 ENCOUNTER — Encounter: Payer: Medicare Other | Admitting: Vascular Surgery

## 2018-05-09 ENCOUNTER — Encounter (HOSPITAL_COMMUNITY): Payer: Medicare Other

## 2018-05-09 ENCOUNTER — Ambulatory Visit (INDEPENDENT_AMBULATORY_CARE_PROVIDER_SITE_OTHER): Payer: Medicare Other | Admitting: Vascular Surgery

## 2018-05-09 VITALS — BP 150/91 | HR 76 | Temp 98.6°F | Resp 16 | Ht 73.0 in | Wt 224.0 lb

## 2018-05-09 DIAGNOSIS — Z992 Dependence on renal dialysis: Secondary | ICD-10-CM

## 2018-05-09 DIAGNOSIS — N186 End stage renal disease: Secondary | ICD-10-CM | POA: Diagnosis not present

## 2018-05-09 NOTE — Progress Notes (Signed)
Patient ID: [name], male   DOB: Oct 28, 1950, 67 y.o.   MRN: [id number]  Reason for Consult: Chronic Kidney Disease (3-4 wks f/u.  ABI.  S/p RUE fistulogram angioplasty, R arm fistula. Dialysis T, Th, S. )   Referred by Biagio Borg, MD  Subjective:     HPI:  [name] is a 67 y.o. male with end-stage renal disease recently underwent balloon angioplasty of his right arm fistula and innominate vein for difficulty with cannulation concern for thrombus in his fistula.  At that time I was concerned that the fistula would not be working any further as there were multiple stents throughout.  He states that since the intervention fistula is working well he has no complaints.  He follows up having had ABIs prior to this visit.  He does walk with the help of a walker.  Past Medical History:  Diagnosis Date  . Allergic rhinitis, cause unspecified 02/24/2014  . Anemia 06/16/2011  . BENIGN PROSTATIC HYPERTROPHY 10/14/2009  . CAD, NATIVE VESSEL 02/06/2009   a. 06/2007 s/p Taxus DES to the RCA;  b. 08/2016 NSTEMI in setting of SVT/PCI: LM 30ost, LAD 98m (3.0x16 Synergy DES), LCX 66m, OM1 60, RI 40, RCA 70p/m, 21m - not amenable to PCI.  [name] Cervical radiculopathy, chronic 02/23/2016   Right c5-6 by NCS/EMG  . Chronic combined systolic (congestive) and diastolic (congestive) heart failure ([place])    a. 10/2016 Echo: EF 40-45%, Gr2 DD. mildly dil LA.  [name] COLONIC POLYPS, HX OF 10/14/2009  . Dementia [id number]  . Depression 09/24/2015  . DIABETES MELLITUS, TYPE II 02/01/2010  . DYSLIPIDEMIA 06/18/2007  . ESRD (end stage renal disease) on dialysis ([place]) 08/04/2010   "TTS;  " (04/18/2015)  . FOOT PAIN 08/12/2008  . GAIT DISTURBANCE 03/03/2010  . GASTROENTERITIS, VIRAL 10/14/2009  . GERD 06/18/2007  . GOITER, MULTINODULAR 12/26/2007  . GOUT 06/18/2007  . GYNECOMASTIA 07/17/2010  . Hemodialysis access, fistula mature [facility])    Dialysis T-Th-Sa ([place]) Right upper arm fistula  .  Hyperlipidemia 10/16/2011  . Hyperparathyroidism, secondary ([place]) 06/16/2011  . HYPERTENSION 06/18/2007  . Hyperthyroidism   . Hypocalcemia 06/07/2010  . Ischemic cardiomyopathy    a. 10/2016 Echo: EF 40-45%.  . Lumbar stenosis with neurogenic claudication   . ONYCHOMYCOSIS, TOENAILS 12/26/2007  . OSA on CPAP 10/16/2011  . Other malaise and fatigue 11/24/2009  . PERIPHERAL NEUROPATHY 06/18/2007  . Prostate cancer ([place])   . PSVT (paroxysmal supraventricular tachycardia) ([place])    a. 75/4492 complicated by NSTEMI;  b. 11/2016 Treated w/ adenosine in ED;  c. 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  [name] PULMONARY NODULE, RIGHT LOWER LOBE 06/08/2009  . Sleep apnea    cpap machine and o2  . TRANSAMINASES, SERUM, ELEVATED 02/01/2010  . Transfusion history    none recent  . Unspecified hypotension 01/30/2010   Family History  Problem Relation Age of Onset  . Heart disease Sister   . Thyroid nodules Sister   . Heart disease Father   . Diabetes Father   . Kidney failure Father   . Hypertension Father   . Healthy Child   . Healthy Child   . Healthy Child   . Cancer Neg Hx    Past Surgical History:  Procedure Laterality Date  . A/V SHUNTOGRAM N/A 04/07/2018   Procedure: A/V SHUNTOGRAM;  Surgeon: Waynetta Sandy, MD;  Location: [place] [facility];  Service: Cardiovascular;  Laterality: N/A;  . ARTERIOVENOUS GRAFT PLACEMENT  Right 2009   forearm/notes 02/01/2011  . AV FISTULA PLACEMENT  11/07/2011   Procedure: INSERTION OF ARTERIOVENOUS (AV) GORE-TEX GRAFT ARM;  Surgeon: Tinnie Gens, MD;  Location: [place];  Service: Vascular;  Laterality: Left;  . BACK SURGERY  1998  . BASCILIC VEIN TRANSPOSITION Right 02/27/2013   Procedure: BASCILIC VEIN TRANSPOSITION;  Surgeon: Mal Misty, MD;  Location: [place];  Service: Vascular;  Laterality: Right;  Right Basilic Vein Transposition   . CARDIAC CATHETERIZATION N/A 08/06/2016   Procedure: Left Heart Cath and Coronary Angiography;  Surgeon: Jolaine Artist, MD;  Location:  [place] [facility];  Service: Cardiovascular;  Laterality: N/A;  . CARDIAC CATHETERIZATION N/A 08/07/2016   Procedure: Coronary/Graft Atherectomy-CSI LAD;  Surgeon: Peter M Martinique, MD;  Location: [place] [facility];  Service: Cardiovascular;  Laterality: N/A;  . CERVICAL SPINE SURGERY  2/09   "to repair nerve problems in my left arm"  . CHOLECYSTECTOMY    . COLONOSCOPY WITH PROPOFOL N/A 04/26/2017   Procedure: COLONOSCOPY WITH PROPOFOL;  Surgeon: Otis Brace, MD;  Location: [place];  Service: Gastroenterology;  Laterality: N/A;  . CORONARY ANGIOPLASTY WITH STENT PLACEMENT  06/11/2008  . CORONARY ANGIOPLASTY WITH STENT PLACEMENT  06/2007   TAXUS stent to RCA/notes 01/31/2011  . ESOPHAGOGASTRODUODENOSCOPY  09/28/2011   Procedure: ESOPHAGOGASTRODUODENOSCOPY (EGD);  Surgeon: Missy Sabins, MD;  Location: [facility] ENDOSCOPY;  Service: Endoscopy;  Laterality: N/A;  . ESOPHAGOGASTRODUODENOSCOPY N/A 04/07/2015   Procedure: ESOPHAGOGASTRODUODENOSCOPY (EGD);  Surgeon: Teena Irani, MD;  Location: [facility];  Service: Endoscopy;  Laterality: N/A;  . ESOPHAGOGASTRODUODENOSCOPY N/A 04/19/2015   Procedure: ESOPHAGOGASTRODUODENOSCOPY (EGD);  Surgeon: Arta Silence, MD;  Location: [name] ENDOSCOPY;  Service: Endoscopy;  Laterality: N/A;  . FLEXIBLE SIGMOIDOSCOPY N/A 05/21/2017   Procedure: Conni Elliot;  Surgeon: Clarene Essex, MD;  Location: [place];  Service: Endoscopy;  Laterality: N/A;  . FLEXIBLE SIGMOIDOSCOPY Left 07/02/2017   Procedure: FLEXIBLE SIGMOIDOSCOPY;  Surgeon: Laurence Spates, MD;  Location: [place];  Service: Endoscopy;  Laterality: Left;  . FOREIGN BODY REMOVAL  09/2003   via upper endoscopy/notes 02/12/2011  . GIVENS CAPSULE STUDY  09/30/2011   Procedure: GIVENS CAPSULE STUDY;  Surgeon: Jeryl Columbia, MD;  Location: [facility] ENDOSCOPY;  Service: Endoscopy;  Laterality: N/A;  . INSERTION OF DIALYSIS CATHETER Right 2014  . INSERTION OF DIALYSIS CATHETER Left 02/11/2013   Procedure:  INSERTION OF DIALYSIS CATHETER;  Surgeon: Conrad Bridgeville, MD;  Location: [place];  Service: Vascular;  Laterality: Left;  Ultrasound guided  . LUMBAR LAMINECTOMY/DECOMPRESSION MICRODISCECTOMY Bilateral 07/31/2017   Procedure: LAMINECTOMY AND FORAMINOTOMY- BILATERAL LUMBAR TWO- LUMBAR THREE;  Surgeon: Earnie Larsson, MD;  Location: [place];  Service: Neurosurgery;  Laterality: Bilateral;  LAMINECTOMY AND FORAMINOTOMY- BILATERAL LUMBAR 2- LUMBAR 3  . PERIPHERAL VASCULAR BALLOON ANGIOPLASTY  04/07/2018   Procedure: PERIPHERAL VASCULAR BALLOON ANGIOPLASTY;  Surgeon: Waynetta Sandy, MD;  Location: [place] [facility];  Service: Cardiovascular;;  RUE AVF  . REMOVAL OF A DIALYSIS CATHETER Right 02/11/2013   Procedure: REMOVAL OF A DIALYSIS CATHETER;  Surgeon: Conrad [place], MD;  Location: [place];  Service: Vascular;  Laterality: Right;  . SAVORY DILATION N/A 04/07/2015   Procedure: SAVORY DILATION;  Surgeon: Teena Irani, MD;  Location: [facility] ENDOSCOPY;  Service: Endoscopy;  Laterality: N/A;  . SHUNTOGRAM N/A 09/20/2011   Procedure: Earney Mallet;  Surgeon: Conrad Glenns [place]erry, MD;  Location: [facility] CATH LAB;  Service: Cardiovascular;  Laterality: N/A;  . SVT ABLATION N/A 11/26/2016   Procedure:  SVT Ablation;  Surgeon: Will Meredith Leeds, MD;  Location: [place] [facility];  Service: Cardiovascular;  Laterality: N/A;  . TONSILLECTOMY    . TOTAL KNEE ARTHROPLASTY Right 08/02/2015   Procedure: TOTAL KNEE ARTHROPLASTY;  Surgeon: Renette Butters, MD;  Location: [place];  Service: Orthopedics;  Laterality: Right;  . VENOGRAM N/A 01/26/2013   Procedure: VENOGRAM;  Surgeon: Angelia Mould, MD;  Location: [facility] CATH LAB;  Service: Cardiovascular;  Laterality: N/A;    Short Social History:  Social History   Tobacco Use  . Smoking status: Former Smoker    Packs/day: 1.00    Years: 25.00    Pack years: 25.00    Types: Cigarettes    Last attempt to quit: 10/01/2005    Years since quitting: 12.6  . Smokeless tobacco: Former Systems developer     Quit date: 10/01/2005  . Tobacco comment: Quit smoking 2007 Smoked x 25 years 1/2 ppd.  Substance Use Topics  . Alcohol use: No    Alcohol/week: 0.0 standard drinks    Allergies  Allergen Reactions  . Cephalexin Swelling and Other (See Comments)    Tongue swelling  . Statins Other (See Comments)    Weak muscles  . Ciprofloxacin Rash    Current Outpatient Medications  Medication Sig Dispense Refill  . acetaminophen (TYLENOL) 325 MG tablet Take 650 mg by mouth every 6 (six) hours as needed for mild pain or headache.     . Alirocumab (PRALUENT) 150 MG/ML SOPN Inject 150 mg into the skin every 14 (fourteen) days. 2 pen 12  . allopurinol (ZYLOPRIM) 100 MG tablet TAKE ONE TABLET BY MOUTH ONCE DAILY 90 tablet 3  . aspirin EC 81 MG tablet Take 81 mg by mouth daily.    . betamethasone dipropionate (DIPROLENE) 0.05 % cream Apply 1 application topically daily as needed (leg sores).    . citalopram (CELEXA) 10 MG tablet Take 1 tablet (10 mg total) by mouth at bedtime. 90 tablet 3  . ezetimibe (ZETIA) 10 MG tablet TAKE ONE TABLET BY MOUTH ONCE DAILY 90 tablet 3  . ferric citrate (AURYXIA) 1 GM 210 MG(Fe) tablet Take 420 mg by mouth 3 (three) times daily with meals.    . gabapentin (NEURONTIN) 300 MG capsule Take 1 capsule (300 mg total) by mouth at bedtime. (Patient taking differently: Take 600 mg by mouth at bedtime. )    . hydroxypropyl methylcellulose / hypromellose (ISOPTO TEARS / GONIOVISC) 2.5 % ophthalmic solution Place 1 drop into both eyes 3 (three) times daily as needed for dry eyes. 15 mL 11  . midodrine (PROAMATINE) 10 MG tablet Take 1 tablet (10 mg total) by mouth See admin instructions. Once a day only on dialysis days (Tues/Thurs/Sat) 30 tablet 3  . multivitamin (RENA-VIT) TABS tablet Take 1 tablet by mouth daily. Reported on 10/24/2015    . pantoprazole (PROTONIX) 40 MG tablet Take 40 mg by mouth every evening.     . polyethylene glycol (MIRALAX / GLYCOLAX) packet Take 17 g by mouth  daily. (Patient taking differently: Take 17 g by mouth daily as needed for mild constipation. ) 14 each 0  . SENSIPAR 90 MG tablet Take 90 mg by mouth 2 (two) times daily.    Earnestine Mealing 625 MG tablet TAKE THREE TABLETS BY MOUTH TWICE DAILY WITH MEALS 180 tablet 11  . zolpidem (AMBIEN) 5 MG tablet TAKE 1 TABLET BY MOUTH AT BEDTIME AS NEEDED FOR SLEEP 90 tablet 1   No current facility-administered medications  for this visit.     Review of Systems  Constitutional:  Constitutional negative. HENT: HENT negative.  Eyes: Eyes negative.  Respiratory: Positive for shortness of breath and wheezing.  Cardiovascular: Positive for leg swelling.  Musculoskeletal: Musculoskeletal negative.  Skin: Skin negative.  Neurological: Neurological negative. Hematologic: Hematologic/lymphatic negative.  Psychiatric: Psychiatric negative.        Objective:  Objective   Vitals:   05/09/18 1009  BP: (!) 150/91  Pulse: 76  Resp: 16  Temp: 98.6 F (37 C)  TempSrc: Oral  SpO2: 95%  Weight: 224 lb (101.6 kg)  Height: 6\' 1"  (1.854 m)   Body mass index is 29.55 kg/m.  Physical Exam  Constitutional: He is oriented to person, place, and time. He appears well-developed.  HENT:  Head: Normocephalic.  Eyes: Pupils are equal, round, and reactive to light.  Neck: Normal range of motion.  Cardiovascular: Normal rate.  Pulses:      Radial pulses are 2+ on the right side, and 2+ on the left side.  Abdominal: Soft.  Musculoskeletal: He exhibits no edema.  There is a strong thrill in the fistula in his right upper extremity on the medial aspect of his arm  Neurological: He is alert and oriented to person, place, and time.  Skin: Skin is warm and dry.  Psychiatric: He has a normal mood and affect. His behavior is normal. Judgment and thought content normal.    Data: I reviewed his ABIs which are 0.7 a right and 0.5 left     Assessment/Plan:     67 year old male follows up after intervention on his  right arm access.  Currently the fistula is working well for dialysis and has a very strong thrill throughout.  With this he does not need any further intervention at this time thankfully.  Looking at his ABIs today and given his size I am not sure he would be a great candidate for thigh graft but this may be all he has in the future.  If the fistula has issues in the right upper extremity he may be looking at a hero graft would likely get upper extremity venography prior to proceeding. With a lower extremity procedure he would need an angiogram of his lower extremities as well if we are going to go that route.  We will see him as needed.  Sue Fernicola C. Donzetta Matters, MD [facility] [place] [place] Office: [phone] Pager: [phone]      Waynetta Sandy MD [facility]

## 2018-05-10 DIAGNOSIS — D631 Anemia in chronic kidney disease: Secondary | ICD-10-CM | POA: Diagnosis not present

## 2018-05-10 DIAGNOSIS — N186 End stage renal disease: Secondary | ICD-10-CM | POA: Diagnosis not present

## 2018-05-10 DIAGNOSIS — E119 Type 2 diabetes mellitus without complications: Secondary | ICD-10-CM | POA: Diagnosis not present

## 2018-05-10 DIAGNOSIS — N2581 Secondary hyperparathyroidism of renal origin: Secondary | ICD-10-CM | POA: Diagnosis not present

## 2018-05-13 DIAGNOSIS — D631 Anemia in chronic kidney disease: Secondary | ICD-10-CM | POA: Diagnosis not present

## 2018-05-13 DIAGNOSIS — N186 End stage renal disease: Secondary | ICD-10-CM | POA: Diagnosis not present

## 2018-05-13 DIAGNOSIS — E119 Type 2 diabetes mellitus without complications: Secondary | ICD-10-CM | POA: Diagnosis not present

## 2018-05-13 DIAGNOSIS — N2581 Secondary hyperparathyroidism of renal origin: Secondary | ICD-10-CM | POA: Diagnosis not present

## 2018-05-14 ENCOUNTER — Other Ambulatory Visit: Payer: Self-pay | Admitting: Internal Medicine

## 2018-05-14 ENCOUNTER — Ambulatory Visit (INDEPENDENT_AMBULATORY_CARE_PROVIDER_SITE_OTHER): Payer: Medicare Other | Admitting: Internal Medicine

## 2018-05-14 ENCOUNTER — Other Ambulatory Visit (INDEPENDENT_AMBULATORY_CARE_PROVIDER_SITE_OTHER): Payer: Medicare Other

## 2018-05-14 ENCOUNTER — Encounter: Payer: Self-pay | Admitting: Internal Medicine

## 2018-05-14 VITALS — BP 124/86 | HR 90 | Temp 98.7°F | Ht 73.0 in | Wt 244.0 lb

## 2018-05-14 DIAGNOSIS — I5042 Chronic combined systolic (congestive) and diastolic (congestive) heart failure: Secondary | ICD-10-CM

## 2018-05-14 DIAGNOSIS — R531 Weakness: Secondary | ICD-10-CM

## 2018-05-14 DIAGNOSIS — R079 Chest pain, unspecified: Secondary | ICD-10-CM

## 2018-05-14 DIAGNOSIS — I200 Unstable angina: Secondary | ICD-10-CM

## 2018-05-14 DIAGNOSIS — R296 Repeated falls: Secondary | ICD-10-CM

## 2018-05-14 DIAGNOSIS — R7989 Other specified abnormal findings of blood chemistry: Secondary | ICD-10-CM

## 2018-05-14 LAB — BASIC METABOLIC PANEL
BUN: 41 mg/dL — AB (ref 6–23)
CHLORIDE: 98 meq/L (ref 96–112)
CO2: 30 meq/L (ref 19–32)
Calcium: 9 mg/dL (ref 8.4–10.5)
Creatinine, Ser: 8.78 mg/dL (ref 0.40–1.50)
GFR: 7.76 mL/min — CL (ref >60.00)
GLUCOSE: 175 mg/dL — AB (ref 70–99)
POTASSIUM: 5.1 meq/L (ref 3.5–5.1)
Sodium: 140 mEq/L (ref 135–145)

## 2018-05-14 LAB — CBC WITH DIFFERENTIAL/PLATELET
BASOS PCT: 1.3 % (ref 0.0–3.0)
Basophils Absolute: 0.1 10*3/uL (ref 0.0–0.1)
EOS ABS: 0.2 10*3/uL (ref 0.0–0.7)
Eosinophils Relative: 2.6 % (ref 0.0–5.0)
HCT: 36.6 % — ABNORMAL LOW (ref 39.0–52.0)
HEMOGLOBIN: 11.8 g/dL — AB (ref 13.0–17.0)
LYMPHS ABS: 1.6 10*3/uL (ref 0.7–4.0)
Lymphocytes Relative: 21.3 % (ref 12.0–46.0)
MCHC: 32.3 g/dL (ref 30.0–36.0)
MCV: 96.5 fl (ref 78.0–100.0)
MONO ABS: 1 10*3/uL (ref 0.1–1.0)
Monocytes Relative: 14.2 % — ABNORMAL HIGH (ref 3.0–12.0)
NEUTROS ABS: 4.4 10*3/uL (ref 1.4–7.7)
NEUTROS PCT: 60.6 % (ref 43.0–77.0)
PLATELETS: 308 10*3/uL (ref 150.0–400.0)
RBC: 3.8 Mil/uL — ABNORMAL LOW (ref 4.22–5.81)
RDW: 18.4 % — AB (ref 11.5–15.5)
WBC: 7.4 10*3/uL (ref 4.0–10.5)

## 2018-05-14 LAB — HEPATIC FUNCTION PANEL
ALT: 78 U/L — ABNORMAL HIGH (ref 0–53)
AST: 39 U/L — ABNORMAL HIGH (ref 0–37)
Albumin: 4.1 g/dL (ref 3.5–5.2)
Alkaline Phosphatase: 271 U/L — ABNORMAL HIGH (ref 39–117)
BILIRUBIN DIRECT: 0.1 mg/dL (ref 0.0–0.3)
BILIRUBIN TOTAL: 0.7 mg/dL (ref 0.2–1.2)
Total Protein: 8.2 g/dL (ref 6.0–8.3)

## 2018-05-14 LAB — LIPID PANEL
Cholesterol: 231 mg/dL — ABNORMAL HIGH (ref 0–200)
HDL: 77.8 mg/dL (ref >39.00)
LDL CALC: 119 mg/dL — AB (ref 0–99)
NONHDL: 153.61
Total CHOL/HDL Ratio: 3
Triglycerides: 172 mg/dL — ABNORMAL HIGH (ref 0.0–149.0)
VLDL: 34.4 mg/dL (ref 0.0–40.0)

## 2018-05-14 LAB — TSH: TSH: 0.19 u[IU]/mL — ABNORMAL LOW (ref 0.35–4.50)

## 2018-05-14 NOTE — Assessment & Plan Note (Signed)
Etiology unclear, for labs including thyroid,  to f/u any worsening symptoms or concerns

## 2018-05-14 NOTE — Assessment & Plan Note (Signed)
For [facility] with RN and PT

## 2018-05-14 NOTE — Progress Notes (Signed)
Subjective:    Patient ID: [name], male    DOB: 1950/10/07, 67 y.o.   MRN: [id number]  HPI  Her to f/u general medical concerns, hx limited by dementia, here with wife who helps with hx, pt c/o low stamina, fatigue "like a hangover", tends to get off balance but walks with walker, has fallen a few times, only 1 time in last 2 wks, legs just gave out.  Pt denies increased sob or doe, wheezing, orthopnea, PND, increased LE swelling, palpitations, or syncope but has had "chest pain about every 2 wks" o/w hard to describe.  Pt denies new neurological symptoms such as new headache, or facial or extremity weakness but has some burning pains to the distal legs.  Has an appt with Dr Curt Bears soon. Last echo jan 2018, has hx of SVT with Ep study with ablation feb 2018.  Last cath nov 2017 with stenting of the mid LAD. Also sees Dr Jeffie Pollock for CHF, due for f/u in Oct per wife but does not yet have appt, she plans to call.  Also f/u with Dr Rocco Pauls for sept 9.  Last PT has not been recent, but pt states he feels so weak he would not be able to do this.  Getting the praluent for elevated LDL,  Pt denies fever, wt loss, night sweats, or other constitutional symptoms  Denies worsening depressive symptoms, suicidal ideation, or panic; with depression improved since restarted celexa.  But states "I'm dying" frequently per wife, though he cant be more specific Wt Readings from Last 3 Encounters:  05/14/18 244 lb (110.7 kg)  05/09/18 224 lb (101.6 kg)  04/07/18 234 lb (106.1 kg)   Past Medical History:  Diagnosis Date  . Allergic rhinitis, cause unspecified 02/24/2014  . Anemia 06/16/2011  . BENIGN PROSTATIC HYPERTROPHY 10/14/2009  . CAD, NATIVE VESSEL 02/06/2009   a. 06/2007 s/p Taxus DES to the RCA;  b. 08/2016 NSTEMI in setting of SVT/PCI: LM 30ost, LAD 48m (3.0x16 Synergy DES), LCX 34m, OM1 60, RI 40, RCA 70p/m, 38m - not amenable to PCI.  [name] Cervical radiculopathy, chronic 02/23/2016   Right c5-6 by  NCS/EMG  . Chronic combined systolic (congestive) and diastolic (congestive) heart failure ([place])    a. 10/2016 Echo: EF 40-45%, Gr2 DD. mildly dil LA.  [name] COLONIC POLYPS, HX OF 10/14/2009  . Dementia [id number]  . Depression 09/24/2015  . DIABETES MELLITUS, TYPE II 02/01/2010  . DYSLIPIDEMIA 06/18/2007  . ESRD (end stage renal disease) on dialysis ([place]) 08/04/2010   "TTS;  " (04/18/2015)  . FOOT PAIN 08/12/2008  . GAIT DISTURBANCE 03/03/2010  . GASTROENTERITIS, VIRAL 10/14/2009  . GERD 06/18/2007  . GOITER, MULTINODULAR 12/26/2007  . GOUT 06/18/2007  . GYNECOMASTIA 07/17/2010  . Hemodialysis access, fistula mature [facility])    Dialysis T-Th-Sa ([place]) Right upper arm fistula  . Hyperlipidemia 10/16/2011  . Hyperparathyroidism, secondary ([place]) 06/16/2011  . HYPERTENSION 06/18/2007  . Hyperthyroidism   . Hypocalcemia 06/07/2010  . Ischemic cardiomyopathy    a. 10/2016 Echo: EF 40-45%.  . Lumbar stenosis with neurogenic claudication   . ONYCHOMYCOSIS, TOENAILS 12/26/2007  . OSA on CPAP 10/16/2011  . Other malaise and fatigue 11/24/2009  . PERIPHERAL NEUROPATHY 06/18/2007  . Prostate cancer ([place])   . PSVT (paroxysmal supraventricular tachycardia) ([place])    a. 62/3762 complicated by NSTEMI;  b. 11/2016 Treated w/ adenosine in ED;  c. 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  [name] PULMONARY NODULE, RIGHT LOWER LOBE 06/08/2009  .  Sleep apnea    cpap machine and o2  . TRANSAMINASES, SERUM, ELEVATED 02/01/2010  . Transfusion history    none recent  . Unspecified hypotension 01/30/2010   Past Surgical History:  Procedure Laterality Date  . A/V SHUNTOGRAM N/A 04/07/2018   Procedure: A/V SHUNTOGRAM;  Surgeon: Waynetta Sandy, MD;  Location: [place] [facility];  Service: Cardiovascular;  Laterality: N/A;  . ARTERIOVENOUS GRAFT PLACEMENT Right 2009   forearm/notes 02/01/2011  . AV FISTULA PLACEMENT  11/07/2011   Procedure: INSERTION OF ARTERIOVENOUS (AV) GORE-TEX GRAFT ARM;  Surgeon: Tinnie Gens, MD;   Location: [place];  Service: Vascular;  Laterality: Left;  . BACK SURGERY  1998  . BASCILIC VEIN TRANSPOSITION Right 02/27/2013   Procedure: BASCILIC VEIN TRANSPOSITION;  Surgeon: Mal Misty, MD;  Location: [place];  Service: Vascular;  Laterality: Right;  Right Basilic Vein Transposition   . CARDIAC CATHETERIZATION N/A 08/06/2016   Procedure: Left Heart Cath and Coronary Angiography;  Surgeon: Jolaine Artist, MD;  Location: [place] [facility];  Service: Cardiovascular;  Laterality: N/A;  . CARDIAC CATHETERIZATION N/A 08/07/2016   Procedure: Coronary/Graft Atherectomy-CSI LAD;  Surgeon: Peter M Martinique, MD;  Location: [place] [facility];  Service: Cardiovascular;  Laterality: N/A;  . CERVICAL SPINE SURGERY  2/09   "to repair nerve problems in my left arm"  . CHOLECYSTECTOMY    . COLONOSCOPY WITH PROPOFOL N/A 04/26/2017   Procedure: COLONOSCOPY WITH PROPOFOL;  Surgeon: Otis Brace, MD;  Location: [place];  Service: Gastroenterology;  Laterality: N/A;  . CORONARY ANGIOPLASTY WITH STENT PLACEMENT  06/11/2008  . CORONARY ANGIOPLASTY WITH STENT PLACEMENT  06/2007   TAXUS stent to RCA/notes 01/31/2011  . ESOPHAGOGASTRODUODENOSCOPY  09/28/2011   Procedure: ESOPHAGOGASTRODUODENOSCOPY (EGD);  Surgeon: Missy Sabins, MD;  Location: [facility] ENDOSCOPY;  Service: Endoscopy;  Laterality: N/A;  . ESOPHAGOGASTRODUODENOSCOPY N/A 04/07/2015   Procedure: ESOPHAGOGASTRODUODENOSCOPY (EGD);  Surgeon: Teena Irani, MD;  Location: [facility];  Service: Endoscopy;  Laterality: N/A;  . ESOPHAGOGASTRODUODENOSCOPY N/A 04/19/2015   Procedure: ESOPHAGOGASTRODUODENOSCOPY (EGD);  Surgeon: Arta Silence, MD;  Location: [facility]COPY;  Service: Endoscopy;  Laterality: N/A;  . FLEXIBLE SIGMOIDOSCOPY N/A 05/21/2017   Procedure: Conni Elliot;  Surgeon: Clarene Essex, MD;  Location: [place];  Service: Endoscopy;  Laterality: N/A;  . FLEXIBLE SIGMOIDOSCOPY Left 07/02/2017   Procedure: FLEXIBLE SIGMOIDOSCOPY;   Surgeon: Laurence Spates, MD;  Location: [place];  Service: Endoscopy;  Laterality: Left;  . FOREIGN BODY REMOVAL  09/2003   via upper endoscopy/notes 02/12/2011  . GIVENS CAPSULE STUDY  09/30/2011   Procedure: GIVENS CAPSULE STUDY;  Surgeon: Jeryl Columbia, MD;  Location: [facility] ENDOSCOPY;  Service: Endoscopy;  Laterality: N/A;  . INSERTION OF DIALYSIS CATHETER Right 2014  . INSERTION OF DIALYSIS CATHETER Left 02/11/2013   Procedure: INSERTION OF DIALYSIS CATHETER;  Surgeon: Conrad Thibodaux, MD;  Location: [place];  Service: Vascular;  Laterality: Left;  Ultrasound guided  . LUMBAR LAMINECTOMY/DECOMPRESSION MICRODISCECTOMY Bilateral 07/31/2017   Procedure: LAMINECTOMY AND FORAMINOTOMY- BILATERAL LUMBAR TWO- LUMBAR THREE;  Surgeon: Earnie Larsson, MD;  Location: [place];  Service: Neurosurgery;  Laterality: Bilateral;  LAMINECTOMY AND FORAMINOTOMY- BILATERAL LUMBAR 2- LUMBAR 3  . PERIPHERAL VASCULAR BALLOON ANGIOPLASTY  04/07/2018   Procedure: PERIPHERAL VASCULAR BALLOON ANGIOPLASTY;  Surgeon: Waynetta Sandy, MD;  Location: [place] [facility];  Service: Cardiovascular;;  RUE AVF  . REMOVAL OF A DIALYSIS CATHETER Right 02/11/2013   Procedure: REMOVAL OF A DIALYSIS CATHETER;  Surgeon: Conrad Anson,  MD;  Location: [place];  Service: Vascular;  Laterality: Right;  . SAVORY DILATION N/A 04/07/2015   Procedure: SAVORY DILATION;  Surgeon: Teena Irani, MD;  Location: [facility];  Service: Endoscopy;  Laterality: N/A;  . SHUNTOGRAM N/A 09/20/2011   Procedure: Earney Mallet;  Surgeon: Conrad [place] , MD;  Location: [facility] CATH LAB;  Service: Cardiovascular;  Laterality: N/A;  . SVT ABLATION N/A 11/26/2016   Procedure: SVT Ablation;  Surgeon: Will Meredith Leeds, MD;  Location: [place] [facility];  Service: Cardiovascular;  Laterality: N/A;  . TONSILLECTOMY    . TOTAL KNEE ARTHROPLASTY Right 08/02/2015   Procedure: TOTAL KNEE ARTHROPLASTY;  Surgeon: Renette Butters, MD;  Location: [place];  Service: Orthopedics;  Laterality:  Right;  . VENOGRAM N/A 01/26/2013   Procedure: VENOGRAM;  Surgeon: Angelia Mould, MD;  Location: [facility] C[facility] LAB;  Service: Cardiovascular;  Laterality: N/A;    reports that he quit smoking about 12 years ago. His smoking use included cigarettes. He has a 25.00 pack-year smoking history. He quit smokeless tobacco use about 12 years ago. He reports that he does not drink alcohol or use drugs. family history includes Diabetes in his father; Healthy in his child, child, and child; Heart disease in his father and sister; Hypertension in his father; Kidney failure in his father; Thyroid nodules in his sister. Allergies  Allergen Reactions  . Cephalexin Swelling and Other (See Comments)    Tongue swelling  . Statins Other (See Comments)    Weak muscles  . Ciprofloxacin Rash   Current Outpatient Medications on File Prior to Visit  Medication Sig Dispense Refill  . acetaminophen (TYLENOL) 325 MG tablet Take 650 mg by mouth every 6 (six) hours as needed for mild pain or headache.     . Alirocumab (PRALUENT) 150 MG/ML SOPN Inject 150 mg into the skin every 14 (fourteen) days. 2 pen 12  . allopurinol (ZYLOPRIM) 100 MG tablet TAKE ONE TABLET BY MOUTH ONCE DAILY 90 tablet 3  . aspirin EC 81 MG tablet Take 81 mg by mouth daily.    . betamethasone dipropionate (DIPROLENE) 0.05 % cream Apply 1 application topically daily as needed (leg sores).    . citalopram (CELEXA) 10 MG tablet Take 1 tablet (10 mg total) by mouth at bedtime. 90 tablet 3  . ezetimibe (ZETIA) 10 MG tablet TAKE ONE TABLET BY MOUTH ONCE DAILY 90 tablet 3  . ferric citrate (AURYXIA) 1 GM 210 MG(Fe) tablet Take 420 mg by mouth 3 (three) times daily with meals.    . gabapentin (NEURONTIN) 300 MG capsule Take 1 capsule (300 mg total) by mouth at bedtime. (Patient taking differently: Take 600 mg by mouth at bedtime. )    . hydroxypropyl methylcellulose / hypromellose (ISOPTO TEARS / GONIOVISC) 2.5 % ophthalmic solution Place 1 drop into both  eyes 3 (three) times daily as needed for dry eyes. 15 mL 11  . midodrine (PROAMATINE) 10 MG tablet Take 1 tablet (10 mg total) by mouth See admin instructions. Once a day only on dialysis days (Tues/Thurs/Sat) 30 tablet 3  . multivitamin (RENA-VIT) TABS tablet Take 1 tablet by mouth daily. Reported on 10/24/2015    . pantoprazole (PROTONIX) 40 MG tablet Take 40 mg by mouth every evening.     . polyethylene glycol (MIRALAX / GLYCOLAX) packet Take 17 g by mouth daily. (Patient taking differently: Take 17 g by mouth daily as needed for mild constipation. ) 14 each 0  . SENSIPAR 90 MG tablet  Take 90 mg by mouth 2 (two) times daily.    . WELCHOL 625 MG tablet TAKE THREE TABLETS BY MOUTH TWICE DAILY WITH MEALS 180 tablet 11  . zolpidem (AMBIEN) 5 MG tablet TAKE 1 TABLET BY MOUTH AT BEDTIME AS NEEDED FOR SLEEP 90 tablet 1   No current facility-administered medications on file prior to visit.    Review of Systems  Constitutional: Negative for other unusual diaphoresis or sweats HENT: Negative for ear discharge or swelling Eyes: Negative for other worsening visual disturbances Respiratory: Negative for stridor or other swelling  Gastrointestinal: Negative for worsening distension or other blood Genitourinary: Negative for retention or other urinary change Musculoskeletal: Negative for other MSK pain or swelling Skin: Negative for color change or other new lesions Neurological: Negative for worsening tremors and other numbness  Psychiatric/Behavioral: Negative for worsening agitation or other fatigue All other system neg per pt    Objective:   Physical Exam BP 124/86   Pulse 90   Temp 98.7 F (37.1 C) (Oral)   Ht 6\' 1"  (1.854 m)   Wt 244 lb (110.7 kg)   SpO2 96%   BMI 32.19 kg/m  VS noted,  Constitutional: Pt appears in NAD HENT: Head: NCAT.  Right Ear: External ear normal.  Left Ear: External ear normal.  Eyes: . Pupils are equal, round, and reactive to light. Conjunctivae and EOM are  normal Nose: without d/c or deformity Neck: Neck supple. Gross normal ROM Cardiovascular: Normal rate and regular rhythm.   Pulmonary/Chest: Effort normal and breath sounds without rales or wheezing.  Abd:  Soft, NT, ND, + BS, no organomegaly Neurological: Pt is alert. At baseline orientation, motor grossly intact Skin: Skin is warm. No rashes, other new lesions, no LE edema Psychiatric: Pt behavior is normal without agitation  No other exam findings Lab Results  Component Value Date   WBC 7.0 01/16/2018   HGB 12.9 (L) 04/07/2018   HCT 38.0 (L) 04/07/2018   PLT 287 01/16/2018   GLUCOSE 159 (H) 04/07/2018   CHOL 278 (H) 01/16/2018   TRIG 169 (H) 01/16/2018   HDL 48 01/16/2018   LDLDIRECT 136.0 07/16/2014   LDLCALC 196 (H) 01/16/2018   ALT 12 (L) 07/02/2017   AST 14 (L) 07/02/2017   NA 141 04/07/2018   K 5.1 04/07/2018   CL 103 04/07/2018   CREATININE 10.40 (H) 04/07/2018   BUN 56 (H) 04/07/2018   CO2 20 (L) 01/16/2018   TSH 0.39 01/06/2018   PSA 3.67 08/30/2014   INR 0.95 04/22/2017   HGBA1C 6.2 01/06/2018   ECG today I have personally interpreted NSR with non specific T wave changes      Assessment & Plan:

## 2018-05-14 NOTE — Assessment & Plan Note (Addendum)
Atypical, ecg reviewed, declines stress test for now, for cardiology consult  Note:  Total time for pt hx, exam, review of record with pt in the room, determination of diagnoses and plan for further eval and tx is > 40 min, with over 50% spent in coordination and counseling of patient including the differential dx, tx, further evaluation and other management of CP, CHF, marked general weakness and recurrent falls

## 2018-05-14 NOTE — Assessment & Plan Note (Signed)
stable overall by history and exam, recent data reviewed with pt, and pt to continue medical treatment as before,  to f/u any worsening symptoms or concerns  

## 2018-05-14 NOTE — Patient Instructions (Signed)
Your EKG was "non specifi: today (did not show definite problem)  Please continue all other medications as before, and refills have been done if requested.  Please have the pharmacy call with any other refills you may need.  Please continue your efforts at being more active, low cholesterol diet, and weight control.  You are otherwise up to date with prevention measures today.  Please keep your appointments with your specialists as you may have planned  You will be contacted regarding the referral for: Home health with RN and Physical Therapy  You will be contacted regarding the referral for: Cardiology  Please go to the LAB in the Basement (turn left off the elevator) for the tests to be done today  You will be contacted by phone if any changes need to be made immediately.  Otherwise, you will receive a letter about your results with an explanation, but please check with MyChart first.  Please remember to sign up for MyChart if you have not done so, as this will be important to you in the future with finding out test results, communicating by private email, and scheduling acute appointments online when needed.  Please return in 3 months, or sooner if needed

## 2018-05-15 ENCOUNTER — Telehealth: Payer: Self-pay

## 2018-05-15 DIAGNOSIS — E119 Type 2 diabetes mellitus without complications: Secondary | ICD-10-CM | POA: Diagnosis not present

## 2018-05-15 DIAGNOSIS — N2581 Secondary hyperparathyroidism of renal origin: Secondary | ICD-10-CM | POA: Diagnosis not present

## 2018-05-15 DIAGNOSIS — N186 End stage renal disease: Secondary | ICD-10-CM | POA: Diagnosis not present

## 2018-05-15 DIAGNOSIS — D631 Anemia in chronic kidney disease: Secondary | ICD-10-CM | POA: Diagnosis not present

## 2018-05-15 NOTE — Telephone Encounter (Signed)
-----   Message from Biagio Borg, MD sent at 05/14/2018  6:32 PM EDT ----- Left message on MyChart, pt to cont same tx except  The test results show that your current treatment is OK, except the thyroid testing seems consistent with an overactive thyroid condition.  This can lead to weakness.  We should refer you to Endocrinology for further consideration.   Sandria Mcenroe to please inform pt, I will do referral back to Dr Loanne Drilling

## 2018-05-15 NOTE — Telephone Encounter (Signed)
Pt's wife has been informed and expressed understanding. 

## 2018-05-16 ENCOUNTER — Encounter: Payer: Self-pay | Admitting: Endocrinology

## 2018-05-16 ENCOUNTER — Ambulatory Visit (INDEPENDENT_AMBULATORY_CARE_PROVIDER_SITE_OTHER): Payer: Medicare Other | Admitting: Endocrinology

## 2018-05-16 VITALS — BP 126/82 | HR 92 | Ht 73.0 in | Wt 225.0 lb

## 2018-05-16 DIAGNOSIS — I200 Unstable angina: Secondary | ICD-10-CM | POA: Diagnosis not present

## 2018-05-16 DIAGNOSIS — C61 Malignant neoplasm of prostate: Secondary | ICD-10-CM | POA: Diagnosis not present

## 2018-05-16 DIAGNOSIS — E1121 Type 2 diabetes mellitus with diabetic nephropathy: Secondary | ICD-10-CM | POA: Diagnosis not present

## 2018-05-16 LAB — POCT GLYCOSYLATED HEMOGLOBIN (HGB A1C): Hemoglobin A1C: 6.3 % — AB (ref 4.0–5.6)

## 2018-05-16 MED ORDER — METHIMAZOLE 5 MG PO TABS: 2.5000 mg | ORAL_TABLET | Freq: Every day | ORAL | 3 refills | Status: DC

## 2018-05-16 NOTE — Patient Instructions (Addendum)
Please continue the same colvesalam. I have sent a prescription to your pharmacy, to resume the methimazole If ever you have fever while taking methimazole, stop it and call us, even if the reason is obvious, because of the risk of a rare side-effect.  Please come back for a follow-up appointment in 3 months.

## 2018-05-16 NOTE — Progress Notes (Signed)
Subjective:    Patient ID: [name], male    DOB: Mar 29, 1951, 67 y.o.   MRN: [id number]  HPI Pt has diabetes mellitus:  DM type: 2 Dx'ed: 7616 Complications: polyneuropathy, renal failure, and CAD.  Therapy: welchol DKA: never.  Severe hypoglycemia: never.  Pancreatitis: never.  Other: med needs have decreased with worsening of renal function.  Interval history: pt states cbg's are well-controlled.  There is no trend throughout the day.  He denies hypoglycemia.   Pt also has Hyperthyroidism (due to a multinodular goiter; dx'ed 2009; bx then was benign; nuc med scan showed very low uptake, so tapazole was chosen as rx).  He has not recently taken tapazole.  Past Medical History:  Diagnosis Date  . Allergic rhinitis, cause unspecified 02/24/2014  . Anemia 06/16/2011  . BENIGN PROSTATIC HYPERTROPHY 10/14/2009  . CAD, NATIVE VESSEL 02/06/2009   a. 06/2007 s/p Taxus DES to the RCA;  b. 08/2016 NSTEMI in setting of SVT/PCI: LM 30ost, LAD 77m (3.0x16 Synergy DES), LCX 39m, OM1 60, RI 40, RCA 70p/m, 75m - not amenable to PCI.  [name] Cervical radiculopathy, chronic 02/23/2016   Right c5-6 by NCS/EMG  . Chronic combined systolic (congestive) and diastolic (congestive) heart failure ([place])    a. 10/2016 Echo: EF 40-45%, Gr2 DD. mildly dil LA.  [name] COLONIC POLYPS, HX OF 10/14/2009  . Dementia [id number]  . Depression 09/24/2015  . DIABETES MELLITUS, TYPE II 02/01/2010  . DYSLIPIDEMIA 06/18/2007  . ESRD (end stage renal disease) on dialysis ([place]) 08/04/2010   "TTS;  " (04/18/2015)  . FOOT PAIN 08/12/2008  . GAIT DISTURBANCE 03/03/2010  . GASTROENTERITIS, VIRAL 10/14/2009  . GERD 06/18/2007  . GOITER, MULTINODULAR 12/26/2007  . GOUT 06/18/2007  . GYNECOMASTIA 07/17/2010  . Hemodialysis access, fistula mature [facility])    Dialysis T-Th-Sa ([place]) Right upper arm fistula  . Hyperlipidemia 10/16/2011  . Hyperparathyroidism, secondary ([place]) 06/16/2011  . HYPERTENSION 06/18/2007  .  Hyperthyroidism   . Hypocalcemia 06/07/2010  . Ischemic cardiomyopathy    a. 10/2016 Echo: EF 40-45%.  . Lumbar stenosis with neurogenic claudication   . ONYCHOMYCOSIS, TOENAILS 12/26/2007  . OSA on CPAP 10/16/2011  . Other malaise and fatigue 11/24/2009  . PERIPHERAL NEUROPATHY 06/18/2007  . Prostate cancer ([place])   . PSVT (paroxysmal supraventricular tachycardia) ([place])    a. 94/8546 complicated by NSTEMI;  b. 11/2016 Treated w/ adenosine in ED;  c. 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  [name] PULMONARY NODULE, RIGHT LOWER LOBE 06/08/2009  . Sleep apnea    cpap machine and o2  . TRANSAMINASES, SERUM, ELEVATED 02/01/2010  . Transfusion history    none recent  . Unspecified hypotension 01/30/2010    Past Surgical History:  Procedure Laterality Date  . A/V SHUNTOGRAM N/A 04/07/2018   Procedure: A/V SHUNTOGRAM;  Surgeon: Waynetta Sandy, MD;  Location: [place] CV LAB;  Service: Cardiovascular;  Laterality: N/A;  . ARTERIOVENOUS GRAFT PLACEMENT Right 2009   forearm/notes 02/01/2011  . AV FISTULA PLACEMENT  11/07/2011   Procedure: INSERTION OF ARTERIOVENOUS (AV) GORE-TEX GRAFT ARM;  Surgeon: Tinnie Gens, MD;  Location: [place];  Service: Vascular;  Laterality: Left;  . BACK SURGERY  1998  . BASCILIC VEIN TRANSPOSITION Right 02/27/2013   Procedure: BASCILIC VEIN TRANSPOSITION;  Surgeon: Mal Misty, MD;  Location: [place];  Service: Vascular;  Laterality: Right;  Right Basilic Vein Transposition   . CARDIAC CATHETERIZATION N/A 08/06/2016   Procedure: Left Heart Cath and Coronary Angiography;  Surgeon: Jolaine Artist, MD;  Location: [place] CV LAB;  Service: Cardiovascular;  Laterality: N/A;  . CARDIAC CATHETERIZATION N/A 08/07/2016   Procedure: Coronary/Graft Atherectomy-CSI LAD;  Surgeon: Peter M Martinique, MD;  Location: [place] CV LAB;  Service: Cardiovascular;  Laterality: N/A;  . CERVICAL SPINE SURGERY  2/09   "to repair nerve problems in my left arm"  . CHOLECYSTECTOMY    . COLONOSCOPY WITH  PROPOFOL N/A 04/26/2017   Procedure: COLONOSCOPY WITH PROPOFOL;  Surgeon: Otis Brace, MD;  Location: [place];  Service: Gastroenterology;  Laterality: N/A;  . CORONARY ANGIOPLASTY WITH STENT PLACEMENT  06/11/2008  . CORONARY ANGIOPLASTY WITH STENT PLACEMENT  06/2007   TAXUS stent to RCA/notes 01/31/2011  . ESOPHAGOGASTRODUODENOSCOPY  09/28/2011   Procedure: ESOPHAGOGASTRODUODENOSCOPY (EGD);  Surgeon: Missy Sabins, MD;  Location: [facility] ENDOSCOPY;  Service: Endoscopy;  Laterality: N/A;  . ESOPHAGOGASTRODUODENOSCOPY N/A 04/07/2015   Procedure: ESOPHAGOGASTRODUODENOSCOPY (EGD);  Surgeon: Teena Irani, MD;  Location: [facility];  Service: Endoscopy;  Laterality: N/A;  . ESOPHAGOGASTRODUODENOSCOPY N/A 04/19/2015   Procedure: ESOPHAGOGASTRODUODENOSCOPY (EGD);  Surgeon: Arta Silence, MD;  Location: [facility] ENDOSCOPY;  Service: Endoscopy;  Laterality: N/A;  . FLEXIBLE SIGMOIDOSCOPY N/A 05/21/2017   Procedure: Conni Elliot;  Surgeon: Clarene Essex, MD;  Location: [place];  Service: Endoscopy;  Laterality: N/A;  . FLEXIBLE SIGMOIDOSCOPY Left 07/02/2017   Procedure: FLEXIBLE SIGMOIDOSCOPY;  Surgeon: Laurence Spates, MD;  Location: [place];  Service: Endoscopy;  Laterality: Left;  . FOREIGN BODY REMOVAL  09/2003   via upper endoscopy/notes 02/12/2011  . GIVENS CAPSULE STUDY  09/30/2011   Procedure: GIVENS CAPSULE STUDY;  Surgeon: Jeryl Columbia, MD;  Location: [facility] ENDOSCOPY;  Service: Endoscopy;  Laterality: N/A;  . INSERTION OF DIALYSIS CATHETER Right 2014  . INSERTION OF DIALYSIS CATHETER Left 02/11/2013   Procedure: INSERTION OF DIALYSIS CATHETER;  Surgeon: Conrad Aldrich, MD;  Location: [place];  Service: Vascular;  Laterality: Left;  Ultrasound guided  . LUMBAR LAMINECTOMY/DECOMPRESSION MICRODISCECTOMY Bilateral 07/31/2017   Procedure: LAMINECTOMY AND FORAMINOTOMY- BILATERAL LUMBAR TWO- LUMBAR THREE;  Surgeon: Earnie Larsson, MD;  Location: [place];  Service: Neurosurgery;  Laterality: Bilateral;   LAMINECTOMY AND FORAMINOTOMY- BILATERAL LUMBAR 2- LUMBAR 3  . PERIPHERAL VASCULAR BALLOON ANGIOPLASTY  04/07/2018   Procedure: PERIPHERAL VASCULAR BALLOON ANGIOPLASTY;  Surgeon: Waynetta Sandy, MD;  Location: [place] CV LAB;  Service: Cardiovascular;;  RUE AVF  . REMOVAL OF A DIALYSIS CATHETER Right 02/11/2013   Procedure: REMOVAL OF A DIALYSIS CATHETER;  Surgeon: Conrad Lennox, MD;  Location: [place];  Service: Vascular;  Laterality: Right;  . SAVORY DILATION N/A 04/07/2015   Procedure: SAVORY DILATION;  Surgeon: Teena Irani, MD;  Location: [facility] ENDOSCOPY;  Service: Endoscopy;  Laterality: N/A;  . SHUNTOGRAM N/A 09/20/2011   Procedure: Earney Mallet;  Surgeon: Conrad Leawood, MD;  Location: [facility] CATH LAB;  Service: Cardiovascular;  Laterality: N/A;  . SVT ABLATION N/A 11/26/2016   Procedure: SVT Ablation;  Surgeon: Will Meredith Leeds, MD;  Location: [place] CV LAB;  Service: Cardiovascular;  Laterality: N/A;  . TONSILLECTOMY    . TOTAL KNEE ARTHROPLASTY Right 08/02/2015   Procedure: TOTAL KNEE ARTHROPLASTY;  Surgeon: Renette Butters, MD;  Location: [place];  Service: Orthopedics;  Laterality: Right;  . VENOGRAM N/A 01/26/2013   Procedure: VENOGRAM;  Surgeon: Angelia Mould, MD;  Location: [facility] CATH LAB;  Service: Cardiovascular;  Laterality: N/A;    Social History   Socioeconomic History  . Marital status: Married  Spouse name: Not on file  . Number of children: 3  . Years of education: 4  . Highest education level: Not on file  Occupational History  . Occupation: disabled    Employer: DISABLED  . Occupation: formerly Lawyer for [employer].  Social Needs  . Financial resource strain: Not on file  . Food insecurity:    Worry: Not on file    Inability: Not on file  . Transportation needs:    Medical: Not on file    Non-medical: Not on file  Tobacco Use  . Smoking status: Former Smoker    Packs/day: 1.00    Years: 25.00    Pack years: 25.00    Types:  Cigarettes    Last attempt to quit: 10/01/2005    Years since quitting: 12.6  . Smokeless tobacco: Former Systems developer    Quit date: 10/01/2005  . Tobacco comment: Quit smoking 2007 Smoked x 25 years 1/2 ppd.  Substance and Sexual Activity  . Alcohol use: No    Alcohol/week: 0.0 standard drinks  . Drug use: No  . Sexual activity: Never  Lifestyle  . Physical activity:    Days per week: Not on file    Minutes per session: Not on file  . Stress: Not on file  Relationships  . Social connections:    Talks on phone: Not on file    Gets together: Not on file    Attends religious service: Not on file    Active member of club or organization: Not on file    Attends meetings of clubs or organizations: Not on file    Relationship status: Not on file  . Intimate partner violence:    Fear of current or ex partner: Not on file    Emotionally abused: Not on file    Physically abused: Not on file    Forced sexual activity: Not on file  Other Topics Concern  . Not on file  Social History Narrative   Lives with wife   Caffeine use: Tea sometimes   No coffee   Left handed     Current Outpatient Medications on File Prior to Visit  Medication Sig Dispense Refill  . acetaminophen (TYLENOL) 325 MG tablet Take 650 mg by mouth every 6 (six) hours as needed for mild pain or headache.     . Alirocumab (PRALUENT) 150 MG/ML SOPN Inject 150 mg into the skin every 14 (fourteen) days. 2 pen 12  . allopurinol (ZYLOPRIM) 100 MG tablet TAKE ONE TABLET BY MOUTH ONCE DAILY 90 tablet 3  . aspirin EC 81 MG tablet Take 81 mg by mouth daily.    . betamethasone dipropionate (DIPROLENE) 0.05 % cream Apply 1 application topically daily as needed (leg sores).    . citalopram (CELEXA) 10 MG tablet Take 1 tablet (10 mg total) by mouth at bedtime. 90 tablet 3  . ezetimibe (ZETIA) 10 MG tablet TAKE ONE TABLET BY MOUTH ONCE DAILY 90 tablet 3  . ferric citrate (AURYXIA) 1 GM 210 MG(Fe) tablet Take 420 mg by mouth 3 (three) times  daily with meals.    . gabapentin (NEURONTIN) 300 MG capsule Take 1 capsule (300 mg total) by mouth at bedtime. (Patient taking differently: Take 600 mg by mouth at bedtime. )    . hydroxypropyl methylcellulose / hypromellose (ISOPTO TEARS / GONIOVISC) 2.5 % ophthalmic solution Place 1 drop into both eyes 3 (three) times daily as needed for dry eyes. 15 mL 11  . midodrine (PROAMATINE) 10  MG tablet Take 1 tablet (10 mg total) by mouth See admin instructions. Once a day only on dialysis days (Tues/Thurs/Sat) 30 tablet 3  . multivitamin (RENA-VIT) TABS tablet Take 1 tablet by mouth daily. Reported on 10/24/2015    . pantoprazole (PROTONIX) 40 MG tablet Take 40 mg by mouth every evening.     . polyethylene glycol (MIRALAX / GLYCOLAX) packet Take 17 g by mouth daily. (Patient taking differently: Take 17 g by mouth daily as needed for mild constipation. ) 14 each 0  . SENSIPAR 90 MG tablet Take 90 mg by mouth 2 (two) times daily.    . WELCHOL 625 MG tablet TAKE THREE TABLETS BY MOUTH TWICE DAILY WITH MEALS 180 tablet 11  . zolpidem (AMBIEN) 5 MG tablet TAKE 1 TABLET BY MOUTH AT BEDTIME AS NEEDED FOR SLEEP 90 tablet 1   No current facility-administered medications on file prior to visit.     Allergies  Allergen Reactions  . Cephalexin Swelling and Other (See Comments)    Tongue swelling  . Statins Other (See Comments)    Weak muscles  . Ciprofloxacin Rash    Family History  Problem Relation Age of Onset  . Heart disease Sister   . Thyroid nodules Sister   . Heart disease Father   . Diabetes Father   . Kidney failure Father   . Hypertension Father   . Healthy Child   . Healthy Child   . Healthy Child   . Cancer Neg Hx     BP 126/82   Pulse 92   Ht 6\' 1"  (1.854 m)   Wt 225 lb (102.1 kg)   SpO2 96%   BMI 29.69 kg/m    Review of Systems He has lost a few lbs.  No fever.      Objective:   Physical Exam VITAL SIGNS:  See vs page GENERAL: no distress.  Pulses: foot pulses are  intact bilaterally.   MSK: no deformity of the feet or ankles.  CV: 1+ bilat edema of the legs. Skin:  5 mm ulcer at the left post distal leg.  No erythema or drainage.  normal color and temp on the feet and ankles Neuro: sensation is intact to touch on the feet and ankles, but decreased from normal. Ext: There is bilateral onychomycosis of the toenails.     Lab Results  Component Value Date   TSH 0.19 (L) 05/14/2018   T4TOTAL 5.6 07/19/2008   A1c=6.3%    Assessment & Plan:  Hyperthyroidism: worse Type 2 DM, with polyneuropathy: well-controlled Renal failure: this limits rx options  Patient Instructions  Please continue the same colvesalam. I have sent a prescription to your pharmacy, to resume the methimazole If ever you have fever while taking methimazole, stop it and call us, even if the reason is obvious, because of the risk of a rare side-effect.  Please come back for a follow-up appointment in 3 months.

## 2018-05-17 DIAGNOSIS — N2581 Secondary hyperparathyroidism of renal origin: Secondary | ICD-10-CM | POA: Diagnosis not present

## 2018-05-17 DIAGNOSIS — D631 Anemia in chronic kidney disease: Secondary | ICD-10-CM | POA: Diagnosis not present

## 2018-05-17 DIAGNOSIS — N186 End stage renal disease: Secondary | ICD-10-CM | POA: Diagnosis not present

## 2018-05-17 DIAGNOSIS — E119 Type 2 diabetes mellitus without complications: Secondary | ICD-10-CM | POA: Diagnosis not present

## 2018-05-20 DIAGNOSIS — N186 End stage renal disease: Secondary | ICD-10-CM | POA: Diagnosis not present

## 2018-05-20 DIAGNOSIS — N2581 Secondary hyperparathyroidism of renal origin: Secondary | ICD-10-CM | POA: Diagnosis not present

## 2018-05-20 DIAGNOSIS — E119 Type 2 diabetes mellitus without complications: Secondary | ICD-10-CM | POA: Diagnosis not present

## 2018-05-20 DIAGNOSIS — D631 Anemia in chronic kidney disease: Secondary | ICD-10-CM | POA: Diagnosis not present

## 2018-05-21 ENCOUNTER — Ambulatory Visit: Payer: Medicare Other | Admitting: Cardiology

## 2018-05-21 DIAGNOSIS — L299 Pruritus, unspecified: Secondary | ICD-10-CM | POA: Diagnosis not present

## 2018-05-21 DIAGNOSIS — Z79899 Other long term (current) drug therapy: Secondary | ICD-10-CM | POA: Diagnosis not present

## 2018-05-21 DIAGNOSIS — L989 Disorder of the skin and subcutaneous tissue, unspecified: Secondary | ICD-10-CM | POA: Diagnosis not present

## 2018-05-21 DIAGNOSIS — L810 Postinflammatory hyperpigmentation: Secondary | ICD-10-CM | POA: Diagnosis not present

## 2018-05-22 DIAGNOSIS — D631 Anemia in chronic kidney disease: Secondary | ICD-10-CM | POA: Diagnosis not present

## 2018-05-22 DIAGNOSIS — E119 Type 2 diabetes mellitus without complications: Secondary | ICD-10-CM | POA: Diagnosis not present

## 2018-05-22 DIAGNOSIS — N2581 Secondary hyperparathyroidism of renal origin: Secondary | ICD-10-CM | POA: Diagnosis not present

## 2018-05-22 DIAGNOSIS — N186 End stage renal disease: Secondary | ICD-10-CM | POA: Diagnosis not present

## 2018-05-22 IMAGING — CR DG LUMBAR SPINE 2-3V
2 series · 2 of 2 positions shown · non-contrast
Comparison: Lumbar MRI June 28, 2017

CLINICAL DATA: L3-4 laminectomy

EXAM:
LUMBAR SPINE - 2-3 VIEW

[lateral (1 of 2)]
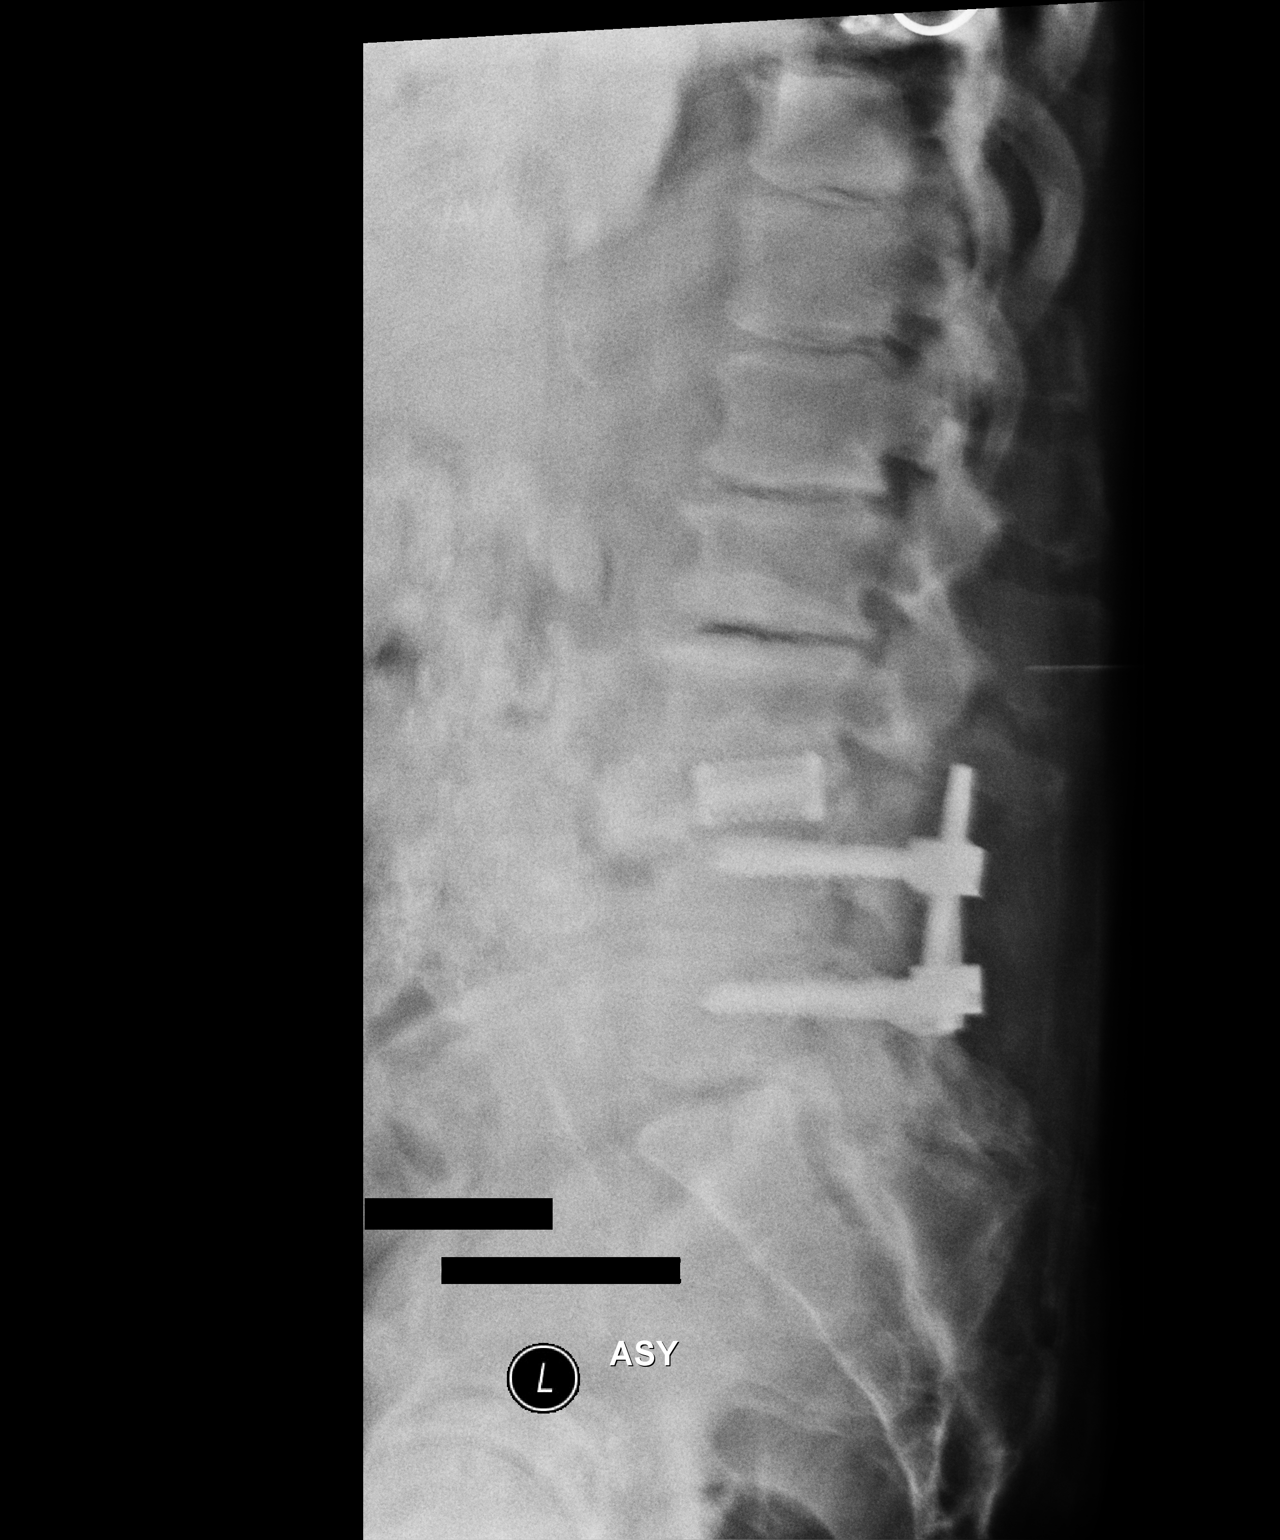

[lateral (2 of 2)]
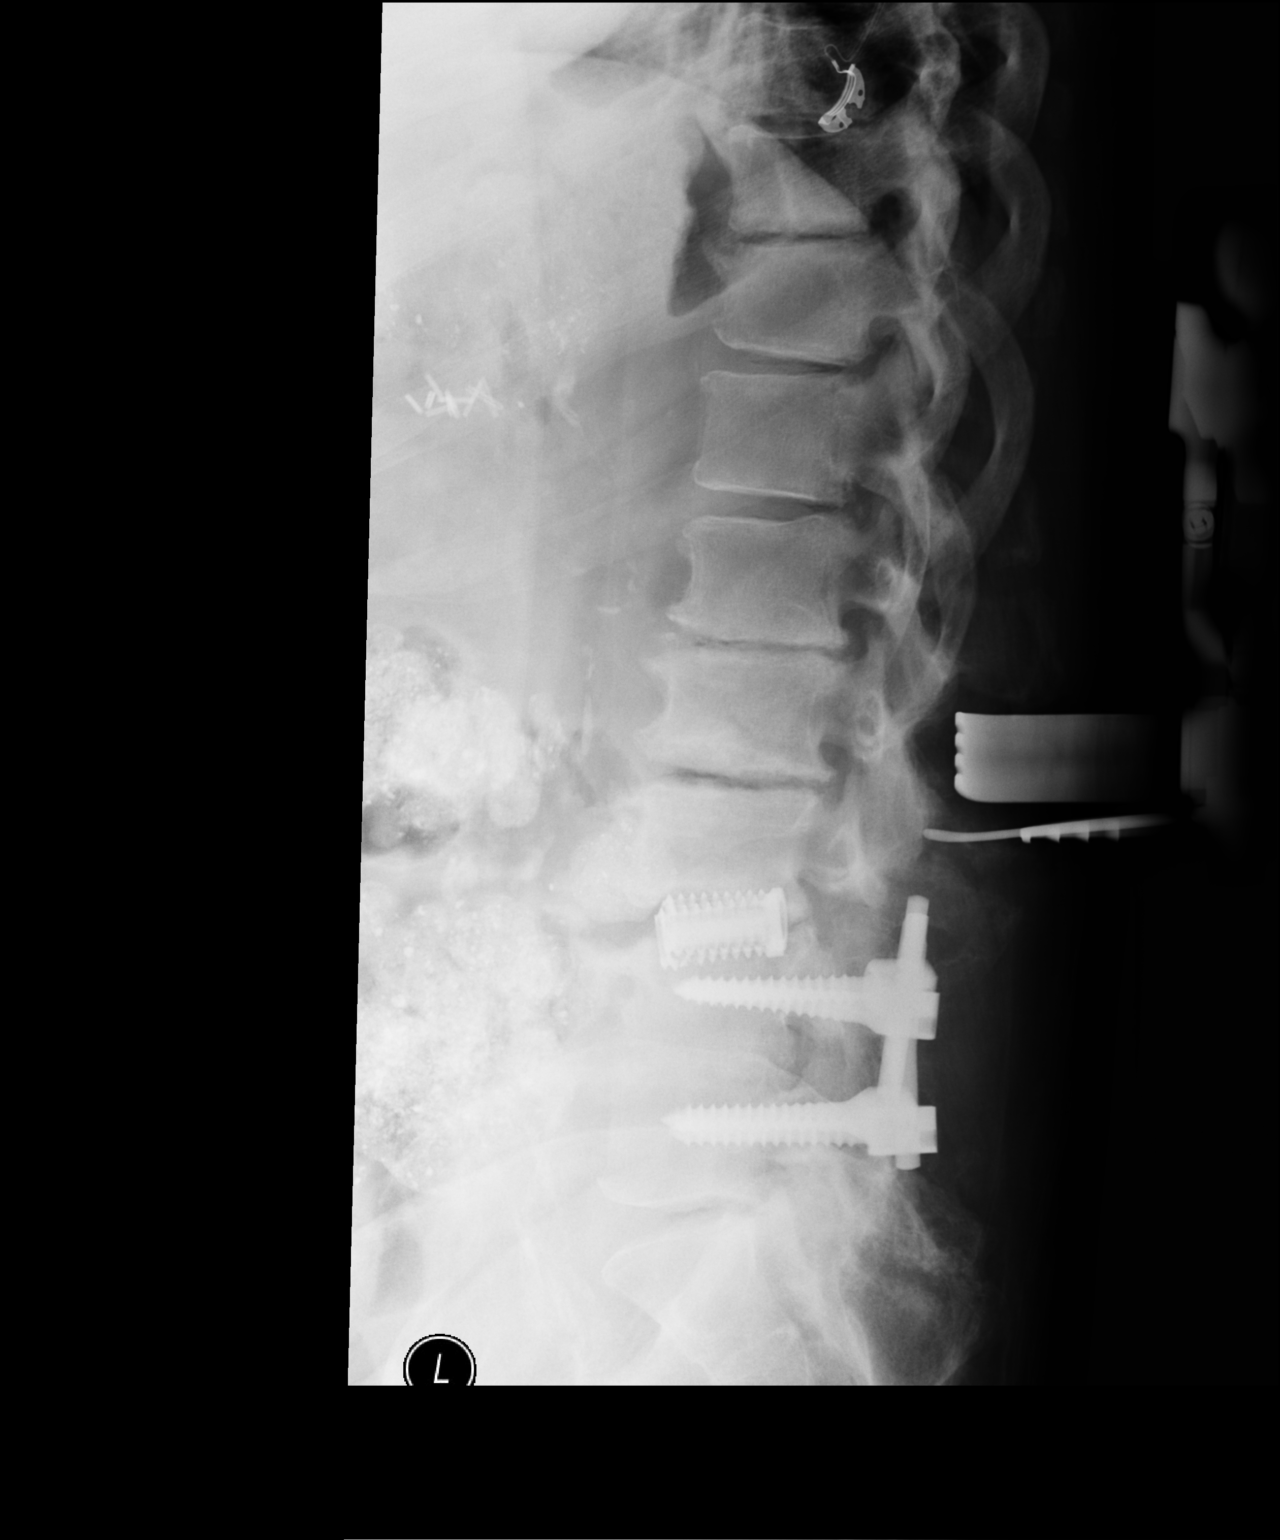

[2 of 2 positions shown; findings below may reference images not displayed]

FINDINGS: Cross-table lateral lumbar image time stamped [DATE]: 33 shows a
metallic probe with tip posterior to the L2-3 interspace. There is
posterior screw and plate fixation at L4-L5 with a disc spacer at
L3-4. There is marked disc space narrowing at L2-3 with vacuum
phenomenon. No fracture or spondylolisthesis.

Cross-table lateral lumbar image time stamped [DATE] shows metallic
probe with tip posterior to the upper portion of the L3 vertebral
body. Cutting tool overlies the L2 spinous process. There is marked
disc space narrowing at L1-2 and L2-3. There is a disc spacer at
L3-4. Postoperative screw and plate fixation is noted at L4 and L5.
IMPRESSION: One final submitted cross-table lateral image, metallic probe tip is
posterior to the superior aspect of the L3 vertebral body. Marked
disc space narrowing noted at L1-2 and L2-3. Disc spacer noted at
L3-4. Postoperative screw and plate fixation noted at L4 and L5.
Disc space narrowing noted at all levels. No fracture or
spondylolisthesis.

## 2018-05-23 ENCOUNTER — Telehealth (HOSPITAL_COMMUNITY): Payer: Self-pay | Admitting: Cardiology

## 2018-05-23 DIAGNOSIS — C61 Malignant neoplasm of prostate: Secondary | ICD-10-CM | POA: Diagnosis not present

## 2018-05-23 NOTE — Telephone Encounter (Signed)
Patient called to request an appt with Dr [name] after advised by urologist to call for appt. Patient is experiencing chest pain and severe weakness.   Advised next available for Dr [name] 10/18 however I could get a work in appt with the Advanced Practitioners (NP/PA) 9/5 @ 130 with [place] to report to ER for active chest pains, arm pains, HA, increased SOB, blurred vision etc

## 2018-05-24 DIAGNOSIS — N2581 Secondary hyperparathyroidism of renal origin: Secondary | ICD-10-CM | POA: Diagnosis not present

## 2018-05-24 DIAGNOSIS — D631 Anemia in chronic kidney disease: Secondary | ICD-10-CM | POA: Diagnosis not present

## 2018-05-24 DIAGNOSIS — E119 Type 2 diabetes mellitus without complications: Secondary | ICD-10-CM | POA: Diagnosis not present

## 2018-05-24 DIAGNOSIS — N186 End stage renal disease: Secondary | ICD-10-CM | POA: Diagnosis not present

## 2018-05-27 DIAGNOSIS — E119 Type 2 diabetes mellitus without complications: Secondary | ICD-10-CM | POA: Diagnosis not present

## 2018-05-27 DIAGNOSIS — N186 End stage renal disease: Secondary | ICD-10-CM | POA: Diagnosis not present

## 2018-05-27 DIAGNOSIS — D631 Anemia in chronic kidney disease: Secondary | ICD-10-CM | POA: Diagnosis not present

## 2018-05-27 DIAGNOSIS — N2581 Secondary hyperparathyroidism of renal origin: Secondary | ICD-10-CM | POA: Diagnosis not present

## 2018-05-29 DIAGNOSIS — D631 Anemia in chronic kidney disease: Secondary | ICD-10-CM | POA: Diagnosis not present

## 2018-05-29 DIAGNOSIS — N2581 Secondary hyperparathyroidism of renal origin: Secondary | ICD-10-CM | POA: Diagnosis not present

## 2018-05-29 DIAGNOSIS — E119 Type 2 diabetes mellitus without complications: Secondary | ICD-10-CM | POA: Diagnosis not present

## 2018-05-29 DIAGNOSIS — N186 End stage renal disease: Secondary | ICD-10-CM | POA: Diagnosis not present

## 2018-05-31 DIAGNOSIS — N2581 Secondary hyperparathyroidism of renal origin: Secondary | ICD-10-CM | POA: Diagnosis not present

## 2018-05-31 DIAGNOSIS — N186 End stage renal disease: Secondary | ICD-10-CM | POA: Diagnosis not present

## 2018-05-31 DIAGNOSIS — E119 Type 2 diabetes mellitus without complications: Secondary | ICD-10-CM | POA: Diagnosis not present

## 2018-05-31 DIAGNOSIS — D631 Anemia in chronic kidney disease: Secondary | ICD-10-CM | POA: Diagnosis not present

## 2018-06-01 DIAGNOSIS — E1129 Type 2 diabetes mellitus with other diabetic kidney complication: Secondary | ICD-10-CM | POA: Diagnosis not present

## 2018-06-01 DIAGNOSIS — N186 End stage renal disease: Secondary | ICD-10-CM | POA: Diagnosis not present

## 2018-06-01 DIAGNOSIS — Z992 Dependence on renal dialysis: Secondary | ICD-10-CM | POA: Diagnosis not present

## 2018-06-03 DIAGNOSIS — N2581 Secondary hyperparathyroidism of renal origin: Secondary | ICD-10-CM | POA: Diagnosis not present

## 2018-06-03 DIAGNOSIS — N186 End stage renal disease: Secondary | ICD-10-CM | POA: Diagnosis not present

## 2018-06-03 DIAGNOSIS — E119 Type 2 diabetes mellitus without complications: Secondary | ICD-10-CM | POA: Diagnosis not present

## 2018-06-03 DIAGNOSIS — D631 Anemia in chronic kidney disease: Secondary | ICD-10-CM | POA: Diagnosis not present

## 2018-06-03 DIAGNOSIS — Z23 Encounter for immunization: Secondary | ICD-10-CM | POA: Diagnosis not present

## 2018-06-03 NOTE — Progress Notes (Signed)
Advanced Heart Failure Clinic Note   Patient ID: [name], male   DOB: 1951/03/29, 67 y.o.   MRN: [id number]  PCP: Dr [name] Nephrologist: Dr Mercy Moore HF: Dr. [name]   HPI: [name] is a 67 y.o. male with PMH: obesity, DM, COPD on nighttime oxygen, sleep apnea on CPAP, ESRD- HD, CAD, S/P Taxus drug-eluting stent to the right coronary in September 6712, chronic systolic/diastolic heart failure (Unable to tolerate statins so placed on Welchol), prostate CA s/p XRT   Evaluated at [facility] for kidney transplant but he was not felt to be candidate (11/2012) due to poor mobility, DM, and coronary disease.    In 11/17 had NSTEMI underwent PCI LAD. RCA had long 80-90% but felt to be not amenable to PCI due to tortuosity. Admitted in 1/18 with SVT and small NSTEMI. Cath films reviewed again with Dr. Martinique and RCA not approachable percutaneously.   Admitted 12/27/16 after transient chest pain and troponin came back mildly elevated in office. Troponin normal in ED here and thought stable for home. Pt thought he actually may have had a panic attack.   Admitted 10/18 with rectal bleeding felt due to severe radiation proctitis with ulcer on flex sig. Plavix stopped. Remains on ASA 81.   Admitted 01/15/18 with sharp chest pain and left-sided chest pressure after dialysis. Trop negative. Scheduled for Myoview which was unremarkable/low risk as below.   He presents after calling HF clinic for appointment due to weakness. From HF perspective he is stable. Denies SOB, orthopnea, PND, or edema. Volume managed by HD. He is wearing CPAP qHS. Appetite okay, eating 2 meals daily. His main complaint is fatigue, weakness, and chills. This has been an ongoing issue, but he is frustrated that he has not improved and seems to be getting worse. It is worse on HD days. He is not working with PT. He has also had a few episodes of right sided CP. Symptoms last a couple minutes and occured when he was lying down. Pain  resolved with sitting up. No associated symptoms. BP remains labile. He was started on methimazole for hyperthyroidism 2 weeks ago. He has not noticed an improvement in symptoms.   Myoview 01/22/18: EF 44%, No ischemia or infarction.   Echo 12/12: EF 55% Myoview 10/15/12: EF 33%. LV Wall Motion: There is global hypokinesis. The LV is markedly enlarged. Small fixed apical defect.  cMRI 02/19/13: EF 37% dilated LV. diffuse HK Echo 6/15: EF 25-30%  Echo 4/16: EF 50-55% Echo 10/17/16 LVEF 40-45%, Grade 2 DD. Trivial MR, Mild LAE, Normal RV.  Review of systems complete and found to be negative unless listed in HPI.   Past Medical History:  Diagnosis Date  . Allergic rhinitis, cause unspecified 02/24/2014  . Anemia 06/16/2011  . BENIGN PROSTATIC HYPERTROPHY 10/14/2009  . CAD, NATIVE VESSEL 02/06/2009   a. 06/2007 s/p Taxus DES to the RCA;  b. 08/2016 NSTEMI in setting of SVT/PCI: LM 30ost, LAD 41m (3.0x16 Synergy DES), LCX 80m, OM1 60, RI 40, RCA 70p/m, 69m - not amenable to PCI.  [name] Cervical radiculopathy, chronic 02/23/2016   Right c5-6 by NCS/EMG  . Chronic combined systolic (congestive) and diastolic (congestive) heart failure ([place])    a. 10/2016 Echo: EF 40-45%, Gr2 DD. mildly dil LA.  [name] COLONIC POLYPS, HX OF 10/14/2009  . Dementia [id number]  . Depression 09/24/2015  . DIABETES MELLITUS, TYPE II 02/01/2010  . DYSLIPIDEMIA 06/18/2007  . ESRD (end stage renal disease) on dialysis ([place]) 08/04/2010   "  TTS;  " (04/18/2015)  . FOOT PAIN 08/12/2008  . GAIT DISTURBANCE 03/03/2010  . GASTROENTERITIS, VIRAL 10/14/2009  . GERD 06/18/2007  . GOITER, MULTINODULAR 12/26/2007  . GOUT 06/18/2007  . GYNECOMASTIA 07/17/2010  . Hemodialysis access, fistula mature [facility])    Dialysis T-Th-Sa ([place]) Right upper arm fistula  . Hyperlipidemia 10/16/2011  . Hyperparathyroidism, secondary ([place]) 06/16/2011  . HYPERTENSION 06/18/2007  . Hyperthyroidism   . Hypocalcemia 06/07/2010  . Ischemic cardiomyopathy     a. 10/2016 Echo: EF 40-45%.  . Lumbar stenosis with neurogenic claudication   . ONYCHOMYCOSIS, TOENAILS 12/26/2007  . OSA on CPAP 10/16/2011  . Other malaise and fatigue 11/24/2009  . PERIPHERAL NEUROPATHY 06/18/2007  . Prostate cancer ([place])   . PSVT (paroxysmal supraventricular tachycardia) ([place])    a. 74/2595 complicated by NSTEMI;  b. 11/2016 Treated w/ adenosine in ED;  c. 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  [name] PULMONARY NODULE, RIGHT LOWER LOBE 06/08/2009  . Sleep apnea    cpap machine and o2  . TRANSAMINASES, SERUM, ELEVATED 02/01/2010  . Transfusion history    none recent  . Unspecified hypotension 01/30/2010   Current Outpatient Medications  Medication Sig Dispense Refill  . acetaminophen (TYLENOL) 325 MG tablet Take 650 mg by mouth every 6 (six) hours as needed for mild pain or headache.     . Alirocumab (PRALUENT) 150 MG/ML SOPN Inject 150 mg into the skin every 14 (fourteen) days. 2 pen 12  . allopurinol (ZYLOPRIM) 100 MG tablet TAKE ONE TABLET BY MOUTH ONCE DAILY 90 tablet 3  . aspirin EC 81 MG tablet Take 81 mg by mouth daily.    . betamethasone dipropionate (DIPROLENE) 0.05 % cream Apply 1 application topically daily as needed (leg sores).    . citalopram (CELEXA) 10 MG tablet Take 1 tablet (10 mg total) by mouth at bedtime. 90 tablet 3  . ezetimibe (ZETIA) 10 MG tablet TAKE ONE TABLET BY MOUTH ONCE DAILY 90 tablet 3  . ferric citrate (AURYXIA) 1 GM 210 MG(Fe) tablet Take 420 mg by mouth 3 (three) times daily with meals.    . gabapentin (NEURONTIN) 300 MG capsule Take 1 capsule (300 mg total) by mouth at bedtime.    . hydroxypropyl methylcellulose / hypromellose (ISOPTO TEARS / GONIOVISC) 2.5 % ophthalmic solution Place 1 drop into both eyes 3 (three) times daily as needed for dry eyes. 15 mL 11  . methimazole (TAPAZOLE) 5 MG tablet Take 0.5 tablets (2.5 mg total) by mouth daily. 45 tablet 3  . midodrine (PROAMATINE) 10 MG tablet Take 1 tablet (10 mg total) by mouth See admin instructions.  Once a day only on dialysis days (Tues/Thurs/Sat) 30 tablet 3  . multivitamin (RENA-VIT) TABS tablet Take 1 tablet by mouth daily. Reported on 10/24/2015    . pantoprazole (PROTONIX) 40 MG tablet Take 40 mg by mouth every evening.     . polyethylene glycol (MIRALAX / GLYCOLAX) packet Take 17 g by mouth daily. (Patient taking differently: Take 17 g by mouth daily as needed for mild constipation. ) 14 each 0  . SENSIPAR 90 MG tablet Take 90 mg by mouth 2 (two) times daily.    . WELCHOL 625 MG tablet TAKE THREE TABLETS BY MOUTH TWICE DAILY WITH MEALS 180 tablet 11  . zolpidem (AMBIEN) 5 MG tablet TAKE 1 TABLET BY MOUTH AT BEDTIME AS NEEDED FOR SLEEP 90 tablet 1   No current facility-administered medications for this encounter.    Vitals:  06/04/18 1320  BP: (!) 160/78  Pulse: 90  SpO2: 100%   Wt Readings from Last 3 Encounters:  06/04/18 101.7 kg (224 lb 2 oz)  05/16/18 102.1 kg (225 lb)  05/14/18 110.7 kg (244 lb)    PHYSICAL EXAM: General: No resp difficulty. Walked into clinic with walker HEENT: Normal Neck: Supple. JVP flat. Carotids 2+ bilat; no bruits. No thyromegaly or nodule noted. Cor: PMI nondisplaced. RRR, No M/G/R noted Lungs: CTAB, normal effort. Abdomen: Soft, non-tender, non-distended, no HSM. No bruits or masses. +BS  Extremities: No cyanosis, clubbing, or rash. R and LLE no edema.  Neuro: Alert & orientedx3, cranial nerves grossly intact. moves all 4 extremities w/o difficulty. Affect pleasant   ASSESSMENT & PLAN:  1. Chronic Systolic Heart Failure:  Mixed ICM/NICM, EF 37% per c-MRI. EF 25-30% (Echo 6/15)  - Most recent EF 40-45% in 1/18. EF 44% on myoview 12/2017 - NYHA III, confounded by fatigue and deconditioning.  - Volume status stable. Managed with HD. - Off ACE/ARB/ARNI due to ESRD.  - Now off BB with hypotension.  - Repeat echo 2. CAD - s/p PCI of LAD 11/17.  - Recurrent NSTEMI in 1/18 due to SVT. Cath films reviewed has 80-90% long lesion in RCA but  not amenable to PCI due to tortuosity - Myoview 01/22/18: EF 44%, No ischemia or infarction.  - He has had right sided CP that occurs when lying flat and improves with sitting up. No associated symptoms. No CP with exertion. Does not sound like ischemic pain.  - Continue ASA 81 mg daily. Has not been able to tolerate statins due to myositis. Continue Zetia and Welchol.  3. Hypotesion - Continue midodrine 10 mg TID - BP remains very labile. No med changes today.  4. ESRD - HD Tues/Thur/Sat - Recently underwent balloon angioplasty of RU fistula with Dr Donzetta Matters 5. PSVT - Resolved s/p ablation for AVNRT on 11/26/16 with resolution of symptoms.  - No change.  6. Deconditioning/fatigue - Multifactorial due to lack of activity and long-term HD.  - Wearing CPAP qHS - PCP has been working up - Refered back to [facility] PT with [facility]. Hyperthyroidism - Started on methimazole 2 weeks ago. May be contributing to his symptoms.   Refer back to [facility] PT Repeat echo Follow up with Dr [name]  Georgiana Shore, NP  1:25 PM  Greater than 50% of the 25 minute visit was spent in counseling/coordination of care regarding disease state education, salt/fluid restriction, sliding scale diuretics, and medication compliance.

## 2018-06-04 ENCOUNTER — Other Ambulatory Visit: Payer: Self-pay

## 2018-06-04 ENCOUNTER — Ambulatory Visit (HOSPITAL_COMMUNITY)
Admission: RE | Admit: 2018-06-04 | Discharge: 2018-06-04 | Disposition: A | Discharge status: Home / Self Care | Payer: Medicare Other | Source: Ambulatory Visit | Attending: Cardiology | Admitting: Cardiology

## 2018-06-04 ENCOUNTER — Encounter (HOSPITAL_COMMUNITY): Payer: Self-pay

## 2018-06-04 VITALS — BP 160/78 | HR 90 | Wt 224.1 lb

## 2018-06-04 DIAGNOSIS — N186 End stage renal disease: Secondary | ICD-10-CM | POA: Insufficient documentation

## 2018-06-04 DIAGNOSIS — I5022 Chronic systolic (congestive) heart failure: Secondary | ICD-10-CM | POA: Diagnosis not present

## 2018-06-04 DIAGNOSIS — E785 Hyperlipidemia, unspecified: Secondary | ICD-10-CM | POA: Insufficient documentation

## 2018-06-04 DIAGNOSIS — E1122 Type 2 diabetes mellitus with diabetic chronic kidney disease: Secondary | ICD-10-CM | POA: Insufficient documentation

## 2018-06-04 DIAGNOSIS — F0390 Unspecified dementia without behavioral disturbance: Secondary | ICD-10-CM | POA: Diagnosis not present

## 2018-06-04 DIAGNOSIS — E1140 Type 2 diabetes mellitus with diabetic neuropathy, unspecified: Secondary | ICD-10-CM | POA: Diagnosis not present

## 2018-06-04 DIAGNOSIS — J449 Chronic obstructive pulmonary disease, unspecified: Secondary | ICD-10-CM | POA: Insufficient documentation

## 2018-06-04 DIAGNOSIS — I132 Hypertensive heart and chronic kidney disease with heart failure and with stage 5 chronic kidney disease, or end stage renal disease: Secondary | ICD-10-CM | POA: Insufficient documentation

## 2018-06-04 DIAGNOSIS — Z992 Dependence on renal dialysis: Secondary | ICD-10-CM

## 2018-06-04 DIAGNOSIS — K219 Gastro-esophageal reflux disease without esophagitis: Secondary | ICD-10-CM | POA: Diagnosis not present

## 2018-06-04 DIAGNOSIS — Z79899 Other long term (current) drug therapy: Secondary | ICD-10-CM | POA: Insufficient documentation

## 2018-06-04 DIAGNOSIS — I5042 Chronic combined systolic (congestive) and diastolic (congestive) heart failure: Secondary | ICD-10-CM | POA: Diagnosis not present

## 2018-06-04 DIAGNOSIS — I471 Supraventricular tachycardia: Secondary | ICD-10-CM | POA: Insufficient documentation

## 2018-06-04 DIAGNOSIS — I2510 Atherosclerotic heart disease of native coronary artery without angina pectoris: Secondary | ICD-10-CM | POA: Diagnosis not present

## 2018-06-04 DIAGNOSIS — E669 Obesity, unspecified: Secondary | ICD-10-CM | POA: Diagnosis not present

## 2018-06-04 DIAGNOSIS — Z8546 Personal history of malignant neoplasm of prostate: Secondary | ICD-10-CM | POA: Diagnosis not present

## 2018-06-04 DIAGNOSIS — I428 Other cardiomyopathies: Secondary | ICD-10-CM | POA: Diagnosis not present

## 2018-06-04 DIAGNOSIS — Z9981 Dependence on supplemental oxygen: Secondary | ICD-10-CM | POA: Diagnosis not present

## 2018-06-04 DIAGNOSIS — R531 Weakness: Secondary | ICD-10-CM | POA: Diagnosis not present

## 2018-06-04 DIAGNOSIS — Z955 Presence of coronary angioplasty implant and graft: Secondary | ICD-10-CM | POA: Diagnosis not present

## 2018-06-04 DIAGNOSIS — F329 Major depressive disorder, single episode, unspecified: Secondary | ICD-10-CM | POA: Insufficient documentation

## 2018-06-04 DIAGNOSIS — G4733 Obstructive sleep apnea (adult) (pediatric): Secondary | ICD-10-CM | POA: Insufficient documentation

## 2018-06-04 DIAGNOSIS — Z7982 Long term (current) use of aspirin: Secondary | ICD-10-CM | POA: Insufficient documentation

## 2018-06-04 DIAGNOSIS — E0590 Thyrotoxicosis, unspecified without thyrotoxic crisis or storm: Secondary | ICD-10-CM | POA: Diagnosis not present

## 2018-06-04 DIAGNOSIS — L870 Keratosis follicularis et parafollicularis in cutem penetrans: Secondary | ICD-10-CM | POA: Diagnosis not present

## 2018-06-04 DIAGNOSIS — N2581 Secondary hyperparathyroidism of renal origin: Secondary | ICD-10-CM | POA: Diagnosis not present

## 2018-06-04 DIAGNOSIS — R5383 Other fatigue: Secondary | ICD-10-CM | POA: Insufficient documentation

## 2018-06-04 DIAGNOSIS — N400 Enlarged prostate without lower urinary tract symptoms: Secondary | ICD-10-CM | POA: Insufficient documentation

## 2018-06-04 DIAGNOSIS — I255 Ischemic cardiomyopathy: Secondary | ICD-10-CM | POA: Insufficient documentation

## 2018-06-04 DIAGNOSIS — I252 Old myocardial infarction: Secondary | ICD-10-CM | POA: Diagnosis not present

## 2018-06-04 DIAGNOSIS — M109 Gout, unspecified: Secondary | ICD-10-CM | POA: Diagnosis not present

## 2018-06-04 NOTE — Progress Notes (Signed)
Order for skilled nursing/physical therapy and patient demographic printed,signed, and faxed to [facility] home [facility]. Fax#[phone] provided by Tiffany with [facility] her direct phone number is [phone].

## 2018-06-04 NOTE — Addendum Note (Signed)
Encounter addended by: Harvie Junior, CMA on: 06/04/2018 2:57 PM  Actions taken: Sign clinical note

## 2018-06-04 NOTE — Addendum Note (Signed)
Encounter addended by: Harvie Junior, CMA on: 06/04/2018 2:50 PM  Actions taken: Order list changed, Diagnosis association updated

## 2018-06-04 NOTE — Patient Instructions (Addendum)
Your provider requests you have an Echocardiogram.  You have been referred to [facility] for home health and physical therapy.  Follow up with Dr.Bensimhon.

## 2018-06-05 DIAGNOSIS — D631 Anemia in chronic kidney disease: Secondary | ICD-10-CM | POA: Diagnosis not present

## 2018-06-05 DIAGNOSIS — Z23 Encounter for immunization: Secondary | ICD-10-CM | POA: Diagnosis not present

## 2018-06-05 DIAGNOSIS — E119 Type 2 diabetes mellitus without complications: Secondary | ICD-10-CM | POA: Diagnosis not present

## 2018-06-05 DIAGNOSIS — N186 End stage renal disease: Secondary | ICD-10-CM | POA: Diagnosis not present

## 2018-06-05 DIAGNOSIS — N2581 Secondary hyperparathyroidism of renal origin: Secondary | ICD-10-CM | POA: Diagnosis not present

## 2018-06-06 DIAGNOSIS — L870 Keratosis follicularis et parafollicularis in cutem penetrans: Secondary | ICD-10-CM | POA: Diagnosis not present

## 2018-06-07 DIAGNOSIS — N186 End stage renal disease: Secondary | ICD-10-CM | POA: Diagnosis not present

## 2018-06-07 DIAGNOSIS — E119 Type 2 diabetes mellitus without complications: Secondary | ICD-10-CM | POA: Diagnosis not present

## 2018-06-07 DIAGNOSIS — Z23 Encounter for immunization: Secondary | ICD-10-CM | POA: Diagnosis not present

## 2018-06-07 DIAGNOSIS — N2581 Secondary hyperparathyroidism of renal origin: Secondary | ICD-10-CM | POA: Diagnosis not present

## 2018-06-07 DIAGNOSIS — D631 Anemia in chronic kidney disease: Secondary | ICD-10-CM | POA: Diagnosis not present

## 2018-06-09 ENCOUNTER — Encounter

## 2018-06-09 ENCOUNTER — Ambulatory Visit: Payer: Medicare Other | Admitting: Endocrinology

## 2018-06-09 ENCOUNTER — Encounter: Payer: Self-pay | Admitting: Cardiology

## 2018-06-09 ENCOUNTER — Ambulatory Visit (INDEPENDENT_AMBULATORY_CARE_PROVIDER_SITE_OTHER): Payer: Medicare Other | Admitting: Cardiology

## 2018-06-09 VITALS — BP 146/82 | HR 100 | Ht 73.0 in | Wt 227.0 lb

## 2018-06-09 DIAGNOSIS — I200 Unstable angina: Secondary | ICD-10-CM

## 2018-06-09 DIAGNOSIS — I5022 Chronic systolic (congestive) heart failure: Secondary | ICD-10-CM

## 2018-06-09 DIAGNOSIS — I25119 Atherosclerotic heart disease of native coronary artery with unspecified angina pectoris: Secondary | ICD-10-CM

## 2018-06-09 DIAGNOSIS — I471 Supraventricular tachycardia: Secondary | ICD-10-CM

## 2018-06-09 NOTE — Patient Instructions (Signed)
Medication Instructions:  Your physician recommends that you continue on your current medications as directed. Please refer to the Current Medication list given to you today.  * If you need a refill on your cardiac medications before your next appointment, please call your pharmacy.   Labwork: None ordered  Testing/Procedures: None ordered  Follow-Up: No follow up is needed at this time with Dr. Camnitz.  He will see you on an as needed basis.   Thank you for choosing [facility]!!   Khristopher Kapaun, RN [phone]     

## 2018-06-09 NOTE — Progress Notes (Signed)
Electrophysiology Office Note   Date:  06/09/2018   ID:  [name], DOB [birth year], MRN [id number]  PCP:  Biagio Borg, MD  Cardiologist:  [place] Primary Electrophysiologist:  [place] Thresher Meredith Leeds, MD    No chief complaint on file.    History of Present Illness: [name] is a 67 y.o. male who presents today for electrophysiology evaluation.   He has a history of obesity, diabetes, COPD on nighttime oxygen, sleep apnea on CPAP, ESRD- HD, CAD, S/P Taxus drug-eluting stent to the right coronary in September 5284, chronic systolic/diastolic heart failure (Unable to tolerate statins so placed on Welchol), prostate CA s/p XRT. On 11/17, he underwent an STEMI and underwent PCI to the LAD. The RCA had a long 80-90% lesion but was felt not amenable to PCI due to tortuosity. He was admitted on 1/18 with SVT and an an STEMI. He had another episode of SVT on 11/15/16 and presented to the emergency room. This was complicated by hypotension, lightheadedness and blurry vision. He was undergoing dialysis at the time when his heart rate went up to the 160s. He was given 6 mg of adenosine which converted him to sinus rhythm. His symptoms then resolved. 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    Today, denies symptoms of palpitations, chest pain, shortness of breath, orthopnea, PND, lower extremity edema, claudication, dizziness, presyncope, syncope, bleeding, or neurologic sequela. The patient is tolerating medications without difficulties.  Overall he is feeling weak and fatigued.  He says he is having difficulty doing much in the way of activity.  He can fall asleep just about any time of the day.  He also has some mild chest discomfort that is in the right side of his chest.  Symptoms lasted couple minutes and occur when he is lying down.  Pain resolved with sitting up.   Past Medical History:  Diagnosis Date  . Allergic rhinitis, cause unspecified 02/24/2014  . Anemia 06/16/2011  . BENIGN  PROSTATIC HYPERTROPHY 10/14/2009  . CAD, NATIVE VESSEL 02/06/2009   a. 06/2007 s/p Taxus DES to the RCA;  b. 08/2016 NSTEMI in setting of SVT/PCI: LM 30ost, LAD 39m (3.0x16 Synergy DES), LCX 11m, OM1 60, RI 40, RCA 70p/m, 60m - not amenable to PCI.  [name] Cervical radiculopathy, chronic 02/23/2016   Right c5-6 by NCS/EMG  . Chronic combined systolic (congestive) and diastolic (congestive) heart failure ([place])    a. 10/2016 Echo: EF 40-45%, Gr2 DD. mildly dil LA.  [name] COLONIC POLYPS, HX OF 10/14/2009  . Dementia [id number]  . Depression 09/24/2015  . DIABETES MELLITUS, TYPE II 02/01/2010  . DYSLIPIDEMIA 06/18/2007  . ESRD (end stage renal disease) on dialysis ([place]) 08/04/2010   "TTS;  " (04/18/2015)  . FOOT PAIN 08/12/2008  . GAIT DISTURBANCE 03/03/2010  . GASTROENTERITIS, VIRAL 10/14/2009  . GERD 06/18/2007  . GOITER, MULTINODULAR 12/26/2007  . GOUT 06/18/2007  . GYNECOMASTIA 07/17/2010  . Hemodialysis access, fistula mature [facility])    Dialysis T-Th-Sa ([place]) Right upper arm fistula  . Hyperlipidemia 10/16/2011  . Hyperparathyroidism, secondary ([place]) 06/16/2011  . HYPERTENSION 06/18/2007  . Hyperthyroidism   . Hypocalcemia 06/07/2010  . Ischemic cardiomyopathy    a. 10/2016 Echo: EF 40-45%.  . Lumbar stenosis with neurogenic claudication   . ONYCHOMYCOSIS, TOENAILS 12/26/2007  . OSA on CPAP 10/16/2011  . Other malaise and fatigue 11/24/2009  . PERIPHERAL NEUROPATHY 06/18/2007  . Prostate cancer ([place])   . PSVT (paroxysmal supraventricular tachycardia) (  [place])    a. 82/9562 complicated by NSTEMI;  b. 11/2016 Treated w/ adenosine in ED;  c. 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  [name] PULMONARY NODULE, RIGHT LOWER LOBE 06/08/2009  . Sleep apnea    cpap machine and o2  . TRANSAMINASES, SERUM, ELEVATED 02/01/2010  . Transfusion history    none recent  . Unspecified hypotension 01/30/2010   Past Surgical History:  Procedure Laterality Date  . A/V SHUNTOGRAM N/A 04/07/2018   Procedure: A/V SHUNTOGRAM;  Surgeon:  Waynetta Sandy, MD;  Location: [place] [facility];  Service: Cardiovascular;  Laterality: N/A;  . ARTERIOVENOUS GRAFT PLACEMENT Right 2009   forearm/notes 02/01/2011  . AV FISTULA PLACEMENT  11/07/2011   Procedure: INSERTION OF ARTERIOVENOUS (AV) GORE-TEX GRAFT ARM;  Surgeon: Tinnie Gens, MD;  Location: [place];  Service: Vascular;  Laterality: Left;  . BACK SURGERY  1998  . BASCILIC VEIN TRANSPOSITION Right 02/27/2013   Procedure: BASCILIC VEIN TRANSPOSITION;  Surgeon: Mal Misty, MD;  Location: [place];  Service: Vascular;  Laterality: Right;  Right Basilic Vein Transposition   . CARDIAC CATHETERIZATION N/A 08/06/2016   Procedure: Left Heart Cath and Coronary Angiography;  Surgeon: Jolaine Artist, MD;  Location: [place] [facility];  Service: Cardiovascular;  Laterality: N/A;  . CARDIAC CATHETERIZATION N/A 08/07/2016   Procedure: Coronary/Graft Atherectomy-CSI LAD;  Surgeon: Peter M Martinique, MD;  Location: [place] [facility];  Service: Cardiovascular;  Laterality: N/A;  . CERVICAL SPINE SURGERY  2/09   "to repair nerve problems in my left arm"  . CHOLECYSTECTOMY    . COLONOSCOPY WITH PROPOFOL N/A 04/26/2017   Procedure: COLONOSCOPY WITH PROPOFOL;  Surgeon: Otis Brace, MD;  Location: [place];  Service: Gastroenterology;  Laterality: N/A;  . CORONARY ANGIOPLASTY WITH STENT PLACEMENT  06/11/2008  . CORONARY ANGIOPLASTY WITH STENT PLACEMENT  06/2007   TAXUS stent to RCA/notes 01/31/2011  . ESOPHAGOGASTRODUODENOSCOPY  09/28/2011   Procedure: ESOPHAGOGASTRODUODENOSCOPY (EGD);  Surgeon: Missy Sabins, MD;  Location: [facility] ENDOSCOPY;  Service: Endoscopy;  Laterality: N/A;  . ESOPHAGOGASTRODUODENOSCOPY N/A 04/07/2015   Procedure: ESOPHAGOGASTRODUODENOSCOPY (EGD);  Surgeon: Teena Irani, MD;  Location: [facility];  Service: Endoscopy;  Laterality: N/A;  . ESOPHAGOGASTRODUODENOSCOPY N/A 04/19/2015   Procedure: ESOPHAGOGASTRODUODENOSCOPY (EGD);  Surgeon: Arta Silence, MD;  Location: [facility]  ENDOSCOPY;  Service: Endoscopy;  Laterality: N/A;  . FLEXIBLE SIGMOIDOSCOPY N/A 05/21/2017   Procedure: Conni Elliot;  Surgeon: Clarene Essex, MD;  Location: [place];  Service: Endoscopy;  Laterality: N/A;  . FLEXIBLE SIGMOIDOSCOPY Left 07/02/2017   Procedure: FLEXIBLE SIGMOIDOSCOPY;  Surgeon: Laurence Spates, MD;  Location: [place];  Service: Endoscopy;  Laterality: Left;  . FOREIGN BODY REMOVAL  09/2003   via upper endoscopy/notes 02/12/2011  . GIVENS CAPSULE STUDY  09/30/2011   Procedure: GIVENS CAPSULE STUDY;  Surgeon: Jeryl Columbia, MD;  Location: [facility] ENDOSCOPY;  Service: Endoscopy;  Laterality: N/A;  . INSERTION OF DIALYSIS CATHETER Right 2014  . INSERTION OF DIALYSIS CATHETER Left 02/11/2013   Procedure: INSERTION OF DIALYSIS CATHETER;  Surgeon: Conrad Batesville, MD;  Location: [place];  Service: Vascular;  Laterality: Left;  Ultrasound guided  . LUMBAR LAMINECTOMY/DECOMPRESSION MICRODISCECTOMY Bilateral 07/31/2017   Procedure: LAMINECTOMY AND FORAMINOTOMY- BILATERAL LUMBAR TWO- LUMBAR THREE;  Surgeon: Earnie Larsson, MD;  Location: [name];  Service: Neurosurgery;  Laterality: Bilateral;  LAMINECTOMY AND FORAMINOTOMY- BILATERAL LUMBAR 2- LUMBAR 3  . PERIPHERAL VASCULAR BALLOON ANGIOPLASTY  04/07/2018   Procedure: PERIPHERAL VASCULAR BALLOON ANGIOPLASTY;  Surgeon: Waynetta Sandy, MD;  Location: [place] CV [facility];  Service: Cardiovascular;;  RUE AVF  . REMOVAL OF A DIALYSIS CATHETER Right 02/11/2013   Procedure: REMOVAL OF A DIALYSIS CATHETER;  Surgeon: Conrad Varnell, MD;  Location: [place];  Service: Vascular;  Laterality: Right;  . SAVORY DILATION N/A 04/07/2015   Procedure: SAVORY DILATION;  Surgeon: Teena Irani, MD;  Location: [facility] ENDOSCOPY;  Service: Endoscopy;  Laterality: N/A;  . SHUNTOGRAM N/A 09/20/2011   Procedure: Earney Mallet;  Surgeon: Conrad Newport, MD;  Location: [facility] CATH LAB;  Service: Cardiovascular;  Laterality: N/A;  . SVT ABLATION N/A 11/26/2016   Procedure: SVT  Ablation;  Surgeon: Haydn Cush Meredith Leeds, MD;  Location: [place] [facility];  Service: Cardiovascular;  Laterality: N/A;  . TONSILLECTOMY    . TOTAL KNEE ARTHROPLASTY Right 08/02/2015   Procedure: TOTAL KNEE ARTHROPLASTY;  Surgeon: Renette Butters, MD;  Location: [place];  Service: Orthopedics;  Laterality: Right;  . VENOGRAM N/A 01/26/2013   Procedure: VENOGRAM;  Surgeon: Angelia Mould, MD;  Location: [facility] CATH LAB;  Service: Cardiovascular;  Laterality: N/A;     Current Outpatient Medications  Medication Sig Dispense Refill  . acetaminophen (TYLENOL) 325 MG tablet Take 650 mg by mouth every 6 (six) hours as needed for mild pain or headache.     . Alirocumab (PRALUENT) 150 MG/ML SOPN Inject 150 mg into the skin every 14 (fourteen) days. 2 pen 12  . allopurinol (ZYLOPRIM) 100 MG tablet TAKE ONE TABLET BY MOUTH ONCE DAILY 90 tablet 3  . aspirin EC 81 MG tablet Take 81 mg by mouth daily.    . betamethasone dipropionate (DIPROLENE) 0.05 % cream Apply 1 application topically daily as needed (leg sores).    . citalopram (CELEXA) 10 MG tablet Take 1 tablet (10 mg total) by mouth at bedtime. 90 tablet 3  . ezetimibe (ZETIA) 10 MG tablet TAKE ONE TABLET BY MOUTH ONCE DAILY 90 tablet 3  . ferric citrate (AURYXIA) 1 GM 210 MG(Fe) tablet Take 420 mg by mouth 3 (three) times daily with meals.    . gabapentin (NEURONTIN) 300 MG capsule Take 1 capsule (300 mg total) by mouth at bedtime.    . hydroxypropyl methylcellulose / hypromellose (ISOPTO TEARS / GONIOVISC) 2.5 % ophthalmic solution Place 1 drop into both eyes 3 (three) times daily as needed for dry eyes. 15 mL 11  . methimazole (TAPAZOLE) 5 MG tablet Take 0.5 tablets (2.5 mg total) by mouth daily. 45 tablet 3  . midodrine (PROAMATINE) 10 MG tablet Take 1 tablet (10 mg total) by mouth See admin instructions. Once a day only on dialysis days (Tues/Thurs/Sat) 30 tablet 3  . multivitamin (RENA-VIT) TABS tablet Take 1 tablet by mouth daily. Reported on  10/24/2015    . pantoprazole (PROTONIX) 40 MG tablet Take 40 mg by mouth every evening.     . polyethylene glycol (MIRALAX / GLYCOLAX) packet Take 17 g by mouth daily. (Patient taking differently: Take 17 g by mouth daily as needed for mild constipation. ) 14 each 0  . SENSIPAR 90 MG tablet Take 90 mg by mouth 2 (two) times daily.    . WELCHOL 625 MG tablet TAKE THREE TABLETS BY MOUTH TWICE DAILY WITH MEALS 180 tablet 11  . zolpidem (AMBIEN) 5 MG tablet TAKE 1 TABLET BY MOUTH AT BEDTIME AS NEEDED FOR SLEEP 90 tablet 1   No current facility-administered medications for this visit.     Allergies:   Cephalexin; Statins; and Ciprofloxacin   Social  History:  The patient  reports that he quit smoking about 12 years ago. His smoking use included cigarettes. He has a 25.00 pack-year smoking history. He quit smokeless tobacco use about 12 years ago. He reports that he does not drink alcohol or use drugs.   Family History:  The patient's family history includes Diabetes in his father; Healthy in his child, child, and child; Heart disease in his father and sister; Hypertension in his father; Kidney failure in his father; Thyroid nodules in his sister.    ROS:  Please see the history of present illness.   Otherwise, review of systems is positive for fatigue, chest pain, shortness of breath, balance problems.   All other systems are reviewed and negative.   PHYSICAL EXAM: VS:  BP (!) 146/82   Pulse 100   Ht 6\' 1"  (1.854 m)   Wt 227 lb (103 kg)   SpO2 97%   BMI 29.95 kg/m  , BMI Body mass index is 29.95 kg/m. GEN: Well nourished, well developed, in no acute distress  HEENT: normal  Neck: no JVD, carotid bruits, or masses Cardiac: RRR; no murmurs, rubs, or gallops,no edema  Respiratory:  clear to auscultation bilaterally, normal work of breathing GI: soft, nontender, nondistended, + BS MS: no deformity or atrophy  Skin: warm and dry Neuro:  Strength and sensation are intact Psych: euthymic mood,  full affect  EKG:  EKG is not ordered today. Personal review of the ekg ordered 05/14/18 shows sinus rhythm, T wave abnormalities diffuse, rate 81  Recent Labs: 05/14/2018: ALT 78; BUN 41; Creatinine, Ser 8.78; Hemoglobin 11.8; Platelets 308.0; Potassium 5.1; Sodium 140; TSH 0.19    Lipid Panel     Component Value Date/Time   CHOL 231 (H) 05/14/2018 1442   TRIG 172.0 (H) 05/14/2018 1442   HDL 77.80 05/14/2018 1442   CHOLHDL 3 05/14/2018 1442   VLDL 34.4 05/14/2018 1442   LDLCALC 119 (H) 05/14/2018 1442   LDLDIRECT 136.0 07/16/2014 1503     Wt Readings from Last 3 Encounters:  06/09/18 227 lb (103 kg)  06/04/18 224 lb 2 oz (101.7 kg)  05/16/18 225 lb (102.1 kg)      Other studies Reviewed: Additional studies/ records that were reviewed today include: TTE 10/17/16, Cath 08/07/16  Review of the above records today demonstrates:  - Left ventricle: The cavity size was normal. Systolic function was   mildly to moderately reduced. The estimated ejection fraction was   in the range of 40% to 45%. Wall motion was normal; there were no   regional wall motion abnormalities. Features are consistent with   a pseudonormal left ventricular filling pattern, with concomitant   abnormal relaxation and increased filling pressure (grade 2   diastolic dysfunction). Doppler parameters are consistent with   elevated ventricular end-diastolic filling pressure. - Aortic valve: There was no regurgitation. - Aortic root: The aortic root was normal in size. - Mitral valve: There was trivial regurgitation. - Left atrium: The atrium was mildly dilated. - Right ventricle: Systolic function was normal. - Right atrium: The atrium was normal in size. - Pulmonary arteries: Systolic pressure was within the normal   range. - Inferior vena cava: The vessel was normal in size. - Pericardium, extracardiac: There was no pericardial effusion.   Mid LAD lesion, 90 %stenosed.  Ost LM to LM lesion, 30  %stenosed.  Prox Cx to Mid Cx lesion, 40 %stenosed.  1st Mrg lesion, 60 %stenosed.  Ramus lesion, 40 %stenosed.  Prox  RCA to Mid RCA lesion, 70 %stenosed.  Mid RCA lesion, 80 %stenosed.  Dist RCA lesion, 0 %stenosed.   Assessment:  1. Heavily calcified coronary arteries 2. High grade lesion in midLAD (90%) and borderline lesion midRCA (70-80%) 3. LVEDP 17   A STENT SYNERGY DES 3X16 drug eluting stent was successfully placed.  Mid LAD lesion, 90 %stenosed.  Post intervention, there is a 0% residual stenosis.   1. Successful orbital atherectomy and stenting of the mid LAD with DES  ASSESSMENT AND PLAN:  1.  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  No recurrences.    2. Chronic systolic heart failure: Fluid status monitored by dialysis.  Mix of ischemic and nonischemic.  Blood pressure drops with dialysis and thus unable to titrate medications appropriately.    3. Coronary artery disease: Currently on dual antiplatelet therapy.  Had a STEMI due to SVT in the past.  Is continuing to have some mild chest discomfort.  Pain does appear to be atypical.  No changes.  4. Hypertension: Cannot titrate blood pressure due to dialysis.    Current medicines are reviewed at length with the patient today.   The patient does not have concerns regarding his medicines.  The following changes were made today: None  Labs/ tests ordered today include:  No orders of the defined types were placed in this encounter.    Disposition:   FU with [name] as needed months  Signed, Laura Radilla Meredith Leeds, MD  06/09/2018 11:42 AM     [facility] [address] [place] [address] [phone] (office) [phone] (fax)

## 2018-06-10 ENCOUNTER — Emergency Department (HOSPITAL_COMMUNITY): Payer: Medicare Other

## 2018-06-10 ENCOUNTER — Other Ambulatory Visit: Payer: Self-pay

## 2018-06-10 ENCOUNTER — Inpatient Hospital Stay (HOSPITAL_COMMUNITY)
Admission: EM | Admit: 2018-06-10 | Discharge: 2018-06-15 | DRG: 193 | Disposition: A | Discharge status: Home Health | Payer: Medicare Other | Attending: Internal Medicine | Admitting: Internal Medicine

## 2018-06-10 ENCOUNTER — Encounter (HOSPITAL_COMMUNITY): Payer: Self-pay | Admitting: *Deleted

## 2018-06-10 DIAGNOSIS — R197 Diarrhea, unspecified: Secondary | ICD-10-CM | POA: Diagnosis not present

## 2018-06-10 DIAGNOSIS — I132 Hypertensive heart and chronic kidney disease with heart failure and with stage 5 chronic kidney disease, or end stage renal disease: Secondary | ICD-10-CM | POA: Diagnosis present

## 2018-06-10 DIAGNOSIS — Q2730 Arteriovenous malformation, site unspecified: Secondary | ICD-10-CM

## 2018-06-10 DIAGNOSIS — D509 Iron deficiency anemia, unspecified: Secondary | ICD-10-CM | POA: Diagnosis present

## 2018-06-10 DIAGNOSIS — K625 Hemorrhage of anus and rectum: Secondary | ICD-10-CM

## 2018-06-10 DIAGNOSIS — Z86010 Personal history of colonic polyps: Secondary | ICD-10-CM

## 2018-06-10 DIAGNOSIS — M109 Gout, unspecified: Secondary | ICD-10-CM | POA: Diagnosis present

## 2018-06-10 DIAGNOSIS — Y92230 Patient room in hospital as the place of occurrence of the external cause: Secondary | ICD-10-CM | POA: Diagnosis not present

## 2018-06-10 DIAGNOSIS — Y95 Nosocomial condition: Secondary | ICD-10-CM | POA: Diagnosis present

## 2018-06-10 DIAGNOSIS — J189 Pneumonia, unspecified organism: Secondary | ICD-10-CM | POA: Diagnosis not present

## 2018-06-10 DIAGNOSIS — C61 Malignant neoplasm of prostate: Secondary | ICD-10-CM | POA: Diagnosis present

## 2018-06-10 DIAGNOSIS — E0590 Thyrotoxicosis, unspecified without thyrotoxic crisis or storm: Secondary | ICD-10-CM | POA: Diagnosis present

## 2018-06-10 DIAGNOSIS — E1129 Type 2 diabetes mellitus with other diabetic kidney complication: Secondary | ICD-10-CM

## 2018-06-10 DIAGNOSIS — K219 Gastro-esophageal reflux disease without esophagitis: Secondary | ICD-10-CM | POA: Diagnosis present

## 2018-06-10 DIAGNOSIS — R011 Cardiac murmur, unspecified: Secondary | ICD-10-CM | POA: Diagnosis present

## 2018-06-10 DIAGNOSIS — I255 Ischemic cardiomyopathy: Secondary | ICD-10-CM | POA: Diagnosis present

## 2018-06-10 DIAGNOSIS — E1122 Type 2 diabetes mellitus with diabetic chronic kidney disease: Secondary | ICD-10-CM | POA: Diagnosis present

## 2018-06-10 DIAGNOSIS — F329 Major depressive disorder, single episode, unspecified: Secondary | ICD-10-CM | POA: Diagnosis present

## 2018-06-10 DIAGNOSIS — Z87891 Personal history of nicotine dependence: Secondary | ICD-10-CM

## 2018-06-10 DIAGNOSIS — Z955 Presence of coronary angioplasty implant and graft: Secondary | ICD-10-CM

## 2018-06-10 DIAGNOSIS — Z79899 Other long term (current) drug therapy: Secondary | ICD-10-CM

## 2018-06-10 DIAGNOSIS — Z6828 Body mass index (BMI) 28.0-28.9, adult: Secondary | ICD-10-CM

## 2018-06-10 DIAGNOSIS — Z7982 Long term (current) use of aspirin: Secondary | ICD-10-CM

## 2018-06-10 DIAGNOSIS — Z8249 Family history of ischemic heart disease and other diseases of the circulatory system: Secondary | ICD-10-CM

## 2018-06-10 DIAGNOSIS — Z841 Family history of disorders of kidney and ureter: Secondary | ICD-10-CM

## 2018-06-10 DIAGNOSIS — R0902 Hypoxemia: Secondary | ICD-10-CM | POA: Diagnosis not present

## 2018-06-10 DIAGNOSIS — E039 Hypothyroidism, unspecified: Secondary | ICD-10-CM | POA: Diagnosis present

## 2018-06-10 DIAGNOSIS — R0602 Shortness of breath: Secondary | ICD-10-CM | POA: Diagnosis not present

## 2018-06-10 DIAGNOSIS — Z881 Allergy status to other antibiotic agents status: Secondary | ICD-10-CM

## 2018-06-10 DIAGNOSIS — N400 Enlarged prostate without lower urinary tract symptoms: Secondary | ICD-10-CM | POA: Diagnosis present

## 2018-06-10 DIAGNOSIS — I5042 Chronic combined systolic (congestive) and diastolic (congestive) heart failure: Secondary | ICD-10-CM | POA: Diagnosis not present

## 2018-06-10 DIAGNOSIS — E1140 Type 2 diabetes mellitus with diabetic neuropathy, unspecified: Secondary | ICD-10-CM | POA: Diagnosis present

## 2018-06-10 DIAGNOSIS — Z23 Encounter for immunization: Secondary | ICD-10-CM | POA: Diagnosis not present

## 2018-06-10 DIAGNOSIS — I739 Peripheral vascular disease, unspecified: Secondary | ICD-10-CM | POA: Diagnosis present

## 2018-06-10 DIAGNOSIS — K5900 Constipation, unspecified: Secondary | ICD-10-CM | POA: Diagnosis present

## 2018-06-10 DIAGNOSIS — Z8546 Personal history of malignant neoplasm of prostate: Secondary | ICD-10-CM

## 2018-06-10 DIAGNOSIS — W1830XA Fall on same level, unspecified, initial encounter: Secondary | ICD-10-CM | POA: Diagnosis not present

## 2018-06-10 DIAGNOSIS — F0390 Unspecified dementia without behavioral disturbance: Secondary | ICD-10-CM | POA: Diagnosis present

## 2018-06-10 DIAGNOSIS — Z923 Personal history of irradiation: Secondary | ICD-10-CM

## 2018-06-10 DIAGNOSIS — E119 Type 2 diabetes mellitus without complications: Secondary | ICD-10-CM | POA: Diagnosis not present

## 2018-06-10 DIAGNOSIS — J181 Lobar pneumonia, unspecified organism: Secondary | ICD-10-CM

## 2018-06-10 DIAGNOSIS — D62 Acute posthemorrhagic anemia: Secondary | ICD-10-CM | POA: Diagnosis present

## 2018-06-10 DIAGNOSIS — Z888 Allergy status to other drugs, medicaments and biological substances status: Secondary | ICD-10-CM

## 2018-06-10 DIAGNOSIS — Z9049 Acquired absence of other specified parts of digestive tract: Secondary | ICD-10-CM

## 2018-06-10 DIAGNOSIS — I10 Essential (primary) hypertension: Secondary | ICD-10-CM | POA: Diagnosis present

## 2018-06-10 DIAGNOSIS — R531 Weakness: Secondary | ICD-10-CM | POA: Diagnosis not present

## 2018-06-10 DIAGNOSIS — I2510 Atherosclerotic heart disease of native coronary artery without angina pectoris: Secondary | ICD-10-CM | POA: Diagnosis present

## 2018-06-10 DIAGNOSIS — K921 Melena: Secondary | ICD-10-CM | POA: Diagnosis present

## 2018-06-10 DIAGNOSIS — K922 Gastrointestinal hemorrhage, unspecified: Secondary | ICD-10-CM

## 2018-06-10 DIAGNOSIS — D631 Anemia in chronic kidney disease: Secondary | ICD-10-CM | POA: Diagnosis not present

## 2018-06-10 DIAGNOSIS — Z833 Family history of diabetes mellitus: Secondary | ICD-10-CM

## 2018-06-10 DIAGNOSIS — Z96651 Presence of right artificial knee joint: Secondary | ICD-10-CM | POA: Diagnosis present

## 2018-06-10 DIAGNOSIS — E1151 Type 2 diabetes mellitus with diabetic peripheral angiopathy without gangrene: Secondary | ICD-10-CM | POA: Diagnosis present

## 2018-06-10 DIAGNOSIS — G4733 Obstructive sleep apnea (adult) (pediatric): Secondary | ICD-10-CM | POA: Diagnosis present

## 2018-06-10 DIAGNOSIS — N186 End stage renal disease: Secondary | ICD-10-CM

## 2018-06-10 DIAGNOSIS — N2581 Secondary hyperparathyroidism of renal origin: Secondary | ICD-10-CM | POA: Diagnosis not present

## 2018-06-10 DIAGNOSIS — E785 Hyperlipidemia, unspecified: Secondary | ICD-10-CM | POA: Diagnosis present

## 2018-06-10 DIAGNOSIS — R0689 Other abnormalities of breathing: Secondary | ICD-10-CM | POA: Diagnosis not present

## 2018-06-10 DIAGNOSIS — W19XXXA Unspecified fall, initial encounter: Secondary | ICD-10-CM | POA: Diagnosis not present

## 2018-06-10 DIAGNOSIS — Z8711 Personal history of peptic ulcer disease: Secondary | ICD-10-CM

## 2018-06-10 DIAGNOSIS — I252 Old myocardial infarction: Secondary | ICD-10-CM

## 2018-06-10 DIAGNOSIS — Z992 Dependence on renal dialysis: Secondary | ICD-10-CM

## 2018-06-10 LAB — COMPREHENSIVE METABOLIC PANEL
ALBUMIN: 2.9 g/dL — AB (ref 3.5–5.0)
ALK PHOS: 243 U/L — AB (ref 38–126)
ALT: 72 U/L — AB (ref 0–44)
ANION GAP: 13 (ref 5–15)
AST: 32 U/L (ref 15–41)
BILIRUBIN TOTAL: 0.8 mg/dL (ref 0.3–1.2)
BUN: 25 mg/dL — AB (ref 8–23)
CALCIUM: 7.7 mg/dL — AB (ref 8.9–10.3)
CO2: 25 mmol/L (ref 22–32)
CREATININE: 7.22 mg/dL — AB (ref 0.61–1.24)
Chloride: 100 mmol/L (ref 98–111)
GFR calc Af Amer: 8 mL/min — ABNORMAL LOW (ref >60)
GFR calc non Af Amer: 7 mL/min — ABNORMAL LOW (ref >60)
GLUCOSE: 148 mg/dL — AB (ref 70–99)
Potassium: 4.6 mmol/L (ref 3.5–5.1)
SODIUM: 138 mmol/L (ref 135–145)
TOTAL PROTEIN: 6.9 g/dL (ref 6.5–8.1)

## 2018-06-10 LAB — CBC WITH DIFFERENTIAL/PLATELET
Abs Immature Granulocytes: 0.1 10*3/uL (ref 0.0–0.1)
BASOS PCT: 0 %
Basophils Absolute: 0 10*3/uL (ref 0.0–0.1)
EOS ABS: 0.1 10*3/uL (ref 0.0–0.7)
EOS PCT: 1 %
HEMATOCRIT: 27.5 % — AB (ref 39.0–52.0)
HEMOGLOBIN: 8.6 g/dL — AB (ref 13.0–17.0)
IMMATURE GRANULOCYTES: 1 %
Lymphocytes Relative: 8 %
Lymphs Abs: 0.9 10*3/uL (ref 0.7–4.0)
MCH: 31.2 pg (ref 26.0–34.0)
MCHC: 31.3 g/dL (ref 30.0–36.0)
MCV: 99.6 fL (ref 78.0–100.0)
MONO ABS: 1.7 10*3/uL — AB (ref 0.1–1.0)
MONOS PCT: 15 %
Neutro Abs: 8.5 10*3/uL — ABNORMAL HIGH (ref 1.7–7.7)
Neutrophils Relative %: 75 %
Platelets: 266 10*3/uL (ref 150–400)
RBC: 2.76 MIL/uL — ABNORMAL LOW (ref 4.22–5.81)
RDW: 17 % — ABNORMAL HIGH (ref 11.5–15.5)
WBC: 11.2 10*3/uL — ABNORMAL HIGH (ref 4.0–10.5)

## 2018-06-10 LAB — POC OCCULT BLOOD, ED: Fecal Occult Bld: POSITIVE — AB

## 2018-06-10 LAB — PROTIME-INR
INR: 1.13
Prothrombin Time: 14.4 seconds (ref 11.4–15.2)

## 2018-06-10 LAB — I-STAT CG4 LACTIC ACID, ED: Lactic Acid, Venous: 1.08 mmol/L (ref 0.5–1.9)

## 2018-06-10 NOTE — ED Triage Notes (Signed)
Pt called EMS initially after falling and needing help up this afternoon. Pt has not been feeling well today; EMS noted pt to have increased SOB, initial Room air sats 86% with fever of 101.5. EMS gave 1000mg  tylenol enroute. Pt is a Tues, Thurs, [id number] dialysis pt, last treatment was today with full treatment

## 2018-06-10 NOTE — ED Notes (Signed)
Pt taken to Xray.

## 2018-06-10 NOTE — Progress Notes (Signed)
Pharmacy Antibiotic Note  [name] is a 67 y.o. male admitted on 06/10/2018 with pneumonia.  Pharmacy has been consulted for zosyn and vancomycin dosing. He has h/o ESRD on HD.   Plan:  Zosyn 3.375 g IV q12h (4hr infusion) Vancomycin 2000 mg IV x1 ,  Will f/u when Hemodialysis session ordered and order vancomycin 1g IV after each HD.  Monitor clinical status, renal function and culture results daily. Vancomycin trough at steady state.      Temp (24hrs), Avg:102.5 F (39.2 C), Min:102.5 F (39.2 C), Max:102.5 F (39.2 C)  Recent Labs  Lab 06/10/18 2232 06/10/18 2237  WBC 11.2*  --   CREATININE 7.22*  --   LATICACIDVEN  --  1.08    Estimated Creatinine Clearance: 12.2 mL/min (A) (by C-G formula based on SCr of 7.22 mg/dL (H)).    Allergies  Allergen Reactions  . Cephalexin Swelling and Other (See Comments)    Tongue swelling  . Statins Other (See Comments)    Weak muscles  . Ciprofloxacin Rash    Thank you for allowing pharmacy to be a part of this patient's care.  Nicole Cella, RPh Clinical Pharmacist Please check AMION for all [place] phone numbers After 10:00 PM, call [place] [phone] 06/10/2018 11:56 PM

## 2018-06-10 NOTE — ED Provider Notes (Signed)
[facility] EMERGENCY DEPARTMENT Provider Note   CSN: [id number] Arrival date & time: 06/10/18  2222     History   Chief Complaint Chief Complaint  Patient presents with  . Shortness of Breath    HPI [name] is a 67 y.o. male.  The history is provided by the patient and medical records.  Shortness of Breath  Associated symptoms include cough.     67 year old male with history of anemia, BPH, coronary artery disease status post stenting, congestive heart failure, depression, diabetes, dyslipidemia, end-stage renal disease on hemodialysis, GERD, presenting to the ED with generalized weakness and shortness of breath.  Wife reports he has been complaining of some fatigue for the past several weeks.  Has been seen by his primary care doctor and cardiology twice without etiology found.  States he went to dialysis today had full treatment, had some issues with bleeding afterwards but got it to stop and came home.  Wife states she sat him up in his recliner in the living room to monitor him for a little bit but when he was trying to get up around 8 PM to get his bed he was unable to stand due to progressive weakness.  She lowered him to the floor and tried to drag him on a sheet with the assistance of her son but was unable to move him.  EMS was called, O2 sats were in the 80's and fever up to 102F.  O2 sats mproved to 90's with 2L supplemental O2.  Patient does not generally require oxygen at home, does use CPAP at night.  Unsure of sick contacts at dialysis center, none at home.at home.  Past Medical History:  Diagnosis Date  . Allergic rhinitis, cause unspecified 02/24/2014  . Anemia 06/16/2011  . BENIGN PROSTATIC HYPERTROPHY 10/14/2009  . CAD, NATIVE VESSEL 02/06/2009   a. 06/2007 s/p Taxus DES to the RCA;  b. 08/2016 NSTEMI in setting of SVT/PCI: LM 30ost, LAD 38m (3.0x16 Synergy DES), LCX 69m, OM1 60, RI 40, RCA 70p/m, 88m - not amenable to PCI.  [name] Cervical  radiculopathy, chronic 02/23/2016   Right c5-6 by NCS/EMG  . Chronic combined systolic (congestive) and diastolic (congestive) heart failure ([place])    a. 10/2016 Echo: EF 40-45%, Gr2 DD. mildly dil LA.  [name] COLONIC POLYPS, HX OF 10/14/2009  . Dementia [id number]  . Depression 09/24/2015  . DIABETES MELLITUS, TYPE II 02/01/2010  . DYSLIPIDEMIA 06/18/2007  . ESRD (end stage renal disease) on dialysis ([place]) 08/04/2010   "TTS;  " (04/18/2015)  . FOOT PAIN 08/12/2008  . GAIT DISTURBANCE 03/03/2010  . GASTROENTERITIS, VIRAL 10/14/2009  . GERD 06/18/2007  . GOITER, MULTINODULAR 12/26/2007  . GOUT 06/18/2007  . GYNECOMASTIA 07/17/2010  . Hemodialysis access, fistula mature [facility])    Dialysis T-Th-Sa ([place]) Right upper arm fistula  . Hyperlipidemia 10/16/2011  . Hyperparathyroidism, secondary ([place]) 06/16/2011  . HYPERTENSION 06/18/2007  . Hyperthyroidism   . Hypocalcemia 06/07/2010  . Ischemic cardiomyopathy    a. 10/2016 Echo: EF 40-45%.  . Lumbar stenosis with neurogenic claudication   . ONYCHOMYCOSIS, TOENAILS 12/26/2007  . OSA on CPAP 10/16/2011  . Other malaise and fatigue 11/24/2009  . PERIPHERAL NEUROPATHY 06/18/2007  . Prostate cancer ([place])   . PSVT (paroxysmal supraventricular tachycardia) ([place])    a. 78/2956 complicated by NSTEMI;  b. 11/2016 Treated w/ adenosine in ED;  c. 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  [name] PULMONARY NODULE, RIGHT LOWER LOBE 06/08/2009  .  Sleep apnea    cpap machine and o2  . TRANSAMINASES, SERUM, ELEVATED 02/01/2010  . Transfusion history    none recent  . Unspecified hypotension 01/30/2010    Patient Active Problem List   Diagnosis Date Noted  . Recurrent falls 05/14/2018  . Chronic combined systolic (congestive) and diastolic (congestive) heart failure ([place]) 01/15/2018  . Abscess of left leg 10/14/2017  . Insomnia 10/14/2017  . Lumbar stenosis with neurogenic claudication 07/31/2017  . Lightheadedness   . Rectal bleeding   . Acute pulmonary edema ([name])   .  Gait disorder 05/05/2017  . Chronic GI bleeding 04/22/2017  . Acute GI bleeding 04/22/2017  . Weakness 12/27/2016  . Generalized weakness 12/27/2016  . History of PSVT (paroxysmal supraventricular tachycardia) 10/16/2016  . Abdominal pain 08/27/2016  . Abnormal findings on diagnostic imaging of liver and biliary tract 08/27/2016  . Bloating symptom 08/27/2016  . Hematochezia 08/27/2016  . Early satiety 08/27/2016  . Eructation 08/27/2016  . Esophageal ulcer 08/27/2016  . Flatulence, eructation and gas pain 08/27/2016  . General weakness 08/27/2016  . Skin sensation disturbance 08/27/2016  . Stricture of esophagus 08/27/2016  . Alternating constipation and diarrhea 08/16/2016  . Status post coronary artery stent placement   . ST segment depression 08/05/2016  . PAD (peripheral artery disease) ([place]) 06/18/2016  . Hypotension 04/29/2016  . Cervical radiculopathy, chronic 02/23/2016  . Memory loss 12/22/2015  . Left-sided weakness 12/22/2015  . Peripheral polyneuropathy 12/22/2015  . DM (diabetes mellitus) type II controlled with renal manifestation ([place]) 10/31/2015  . Nausea and vomiting 10/31/2015  . Malignant neoplasm of prostate ([place]) 10/24/2015  . Right leg pain 09/04/2015  . Chest pain 08/09/2015  . Acute encephalopathy 08/09/2015  . S/P right total knee replacement 08/09/2015  . Primary osteoarthritis of right knee 08/05/2015  . Neuropathy due to secondary diabetes mellitus ([place]) 08/05/2015  . Arthritis of knee 08/02/2015  . Headache 06/08/2015  . Melena 04/19/2015  . Acute blood loss anemia 04/18/2015  . Hyperthyroidism 03/11/2015  . Thigh pain 12/08/2014  . Nodule of right lung 09/14/2014  . PSA elevation 08/30/2014  . ESRD on hemodialysis ([place]) 08/16/2014  . Essential hypertension 08/16/2014  . Pre-transplant evaluation for kidney transplant 08/16/2014  . Allergic rhinitis 02/24/2014  . Elevated PSA 02/24/2014  . Vertigo 10/14/2013  . Cough 06/26/2013  .  Numbness 05/15/2013  . Other complications due to renal dialysis device, implant, and graft 01/14/2013  . Right hand pain 03/28/2012  . Dysphagia 03/28/2012  . Hypogonadism, male 03/28/2012  . OSA (obstructive sleep apnea) 10/16/2011  . HLD (hyperlipidemia) 10/16/2011  . CAD S/P percutaneous coronary angioplasty 09/28/2011  . NSTEMI (non-ST elevated myocardial infarction) ([place]) 09/28/2011  . AVM (arteriovenous malformation) 09/27/2011  . Anemia associated with acute blood loss 09/26/2011  . Ischemic cardiomyopathy 06/16/2011  . Hyperparathyroidism, secondary ([place]) 06/16/2011  . Preventative health care 03/11/2011  . NECK PAIN 07/31/2010  . GYNECOMASTIA 07/17/2010  . DIZZINESS 07/17/2010  . GAIT DISTURBANCE 03/03/2010  . Thyrotoxicosis 02/02/2010  . Elevated levels of transaminase & lactic acid dehydrogenase 02/01/2010  . Other malaise and fatigue 11/24/2009  . Depression 10/14/2009  . BENIGN PROSTATIC HYPERTROPHY 10/14/2009  . COLONIC POLYPS, HX OF 10/14/2009  . Dementia 09/02/2009  . PULMONARY NODULE, RIGHT LOWER LOBE 06/08/2009  . DYSPNEA 10/29/2008  . FOOT PAIN 08/12/2008  . ONYCHOMYCOSIS, TOENAILS 12/26/2007  . GOITER, MULTINODULAR 12/26/2007  . Gout 06/18/2007  . Neuropathy ([place]) 06/18/2007  . Hypertensive heart disease with CHF (congestive heart  failure) ([place]) 06/18/2007  . Gastro-esophageal reflux disease without esophagitis 06/18/2007    Past Surgical History:  Procedure Laterality Date  . A/V SHUNTOGRAM N/A 04/07/2018   Procedure: A/V SHUNTOGRAM;  Surgeon: Waynetta Sandy, MD;  Location: [facility] CV LAB;  Service: Cardiovascular;  Laterality: N/A;  . ARTERIOVENOUS GRAFT PLACEMENT Right 2009   forearm/notes 02/01/2011  . AV FISTULA PLACEMENT  11/07/2011   Procedure: INSERTION OF ARTERIOVENOUS (AV) GORE-TEX GRAFT ARM;  Surgeon: Tinnie Gens, MD;  Location: [facility];  Service: Vascular;  Laterality: Left;  . BACK SURGERY  1998  . BASCILIC VEIN TRANSPOSITION  Right 02/27/2013   Procedure: BASCILIC VEIN TRANSPOSITION;  Surgeon: Mal Misty, MD;  Location: ;  Service: Vascular;  Laterality: Right;  Right Basilic Vein Transposition   . CARDIAC CATHETERIZATION N/A 08/06/2016   Procedure: Left Heart Cath and Coronary Angiography;  Surgeon: Jolaine Artist, MD;  Location: [facility] CV LAB;  Service: Cardiovascular;  Laterality: N/A;  . CARDIAC CATHETERIZATION N/A 08/07/2016   Procedure: Coronary/Graft Atherectomy-CSI LAD;  Surgeon: Peter M Martinique, MD;  Location: [facility] CV LAB;  Service: Cardiovascular;  Laterality: N/A;  . CERVICAL SPINE SURGERY  2/09   "to repair nerve problems in my left arm"  . CHOLECYSTECTOMY    . COLONOSCOPY WITH PROPOFOL N/A 04/26/2017   Procedure: COLONOSCOPY WITH PROPOFOL;  Surgeon: Otis Brace, MD;  Location: [facility];  Service: Gastroenterology;  Laterality: N/A;  . CORONARY ANGIOPLASTY WITH STENT PLACEMENT  06/11/2008  . CORONARY ANGIOPLASTY WITH STENT PLACEMENT  06/2007   TAXUS stent to RCA/notes 01/31/2011  . ESOPHAGOGASTRODUODENOSCOPY  09/28/2011   Procedure: ESOPHAGOGASTRODUODENOSCOPY (EGD);  Surgeon: Missy Sabins, MD;  Location: [facility] ENDOSCOPY;  Service: Endoscopy;  Laterality: N/A;  . ESOPHAGOGASTRODUODENOSCOPY N/A 04/07/2015   Procedure: ESOPHAGOGASTRODUODENOSCOPY (EGD);  Surgeon: Teena Irani, MD;  Location: [facility];  Service: Endoscopy;  Laterality: N/A;  . ESOPHAGOGASTRODUODENOSCOPY N/A 04/19/2015   Procedure: ESOPHAGOGASTRODUODENOSCOPY (EGD);  Surgeon: Arta Silence, MD;  Location: [facility] ENDOSCOPY;  Service: Endoscopy;  Laterality: N/A;  . FLEXIBLE SIGMOIDOSCOPY N/A 05/21/2017   Procedure: Conni Elliot;  Surgeon: Clarene Essex, MD;  Location: [facility];  Service: Endoscopy;  Laterality: N/A;  . FLEXIBLE SIGMOIDOSCOPY Left 07/02/2017   Procedure: FLEXIBLE SIGMOIDOSCOPY;  Surgeon: Laurence Spates, MD;  Location: [facility];  Service: Endoscopy;  Laterality: Left;  . FOREIGN BODY  REMOVAL  09/2003   via upper endoscopy/notes 02/12/2011  . GIVENS CAPSULE STUDY  09/30/2011   Procedure: GIVENS CAPSULE STUDY;  Surgeon: Jeryl Columbia, MD;  Location: [facility] ENDOSCOPY;  Service: Endoscopy;  Laterality: N/A;  . INSERTION OF DIALYSIS CATHETER Right 2014  . INSERTION OF DIALYSIS CATHETER Left 02/11/2013   Procedure: INSERTION OF DIALYSIS CATHETER;  Surgeon: Conrad Knob Noster, MD;  Location: [facility];  Service: Vascular;  Laterality: Left;  Ultrasound guided  . LUMBAR LAMINECTOMY/DECOMPRESSION MICRODISCECTOMY Bilateral 07/31/2017   Procedure: LAMINECTOMY AND FORAMINOTOMY- BILATERAL LUMBAR TWO- LUMBAR THREE;  Surgeon: Earnie Larsson, MD;  Location: [facility];  Service: Neurosurgery;  Laterality: Bilateral;  LAMINECTOMY AND FORAMINOTOMY- BILATERAL LUMBAR 2- LUMBAR 3  . PERIPHERAL VASCULAR BALLOON ANGIOPLASTY  04/07/2018   Procedure: PERIPHERAL VASCULAR BALLOON ANGIOPLASTY;  Surgeon: Waynetta Sandy, MD;  Location: [facility] CV LAB;  Service: Cardiovascular;;  RUE AVF  . REMOVAL OF A DIALYSIS CATHETER Right 02/11/2013   Procedure: REMOVAL OF A DIALYSIS CATHETER;  Surgeon: Conrad Raeford, MD;  Location: [facility];  Service: Vascular;  Laterality: Right;  . SAVORY DILATION N/A 04/07/2015  Procedure: SAVORY DILATION;  Surgeon: Teena Irani, MD;  Location: [facility] ENDOSCOPY;  Service: Endoscopy;  Laterality: N/A;  . SHUNTOGRAM N/A 09/20/2011   Procedure: Earney Mallet;  Surgeon: Conrad Bowman, MD;  Location: [facility] CATH LAB;  Service: Cardiovascular;  Laterality: N/A;  . SVT ABLATION N/A 11/26/2016   Procedure: SVT Ablation;  Surgeon: Will Meredith Leeds, MD;  Location: [facility] CV LAB;  Service: Cardiovascular;  Laterality: N/A;  . TONSILLECTOMY    . TOTAL KNEE ARTHROPLASTY Right 08/02/2015   Procedure: TOTAL KNEE ARTHROPLASTY;  Surgeon: Renette Butters, MD;  Location: [facility];  Service: Orthopedics;  Laterality: Right;  . VENOGRAM N/A 01/26/2013   Procedure: VENOGRAM;  Surgeon: Angelia Mould, MD;  Location: [facility]  CATH LAB;  Service: Cardiovascular;  Laterality: N/A;        Home Medications    Prior to Admission medications   Medication Sig Start Date End Date Taking? Authorizing Provider  acetaminophen (TYLENOL) 325 MG tablet Take 650 mg by mouth every 6 (six) hours as needed for mild pain or headache.     [provider]  Alirocumab (PRALUENT) 150 MG/ML SOPN Inject 150 mg into the skin every 14 (fourteen) days. 03/05/18   Lorretta Harp, MD  allopurinol (ZYLOPRIM) 100 MG tablet TAKE ONE TABLET BY MOUTH ONCE DAILY 10/14/17   Biagio Borg, MD  aspirin EC 81 MG tablet Take 81 mg by mouth daily.    [provider]  betamethasone dipropionate (DIPROLENE) 0.05 % cream Apply 1 application topically daily as needed (leg sores).    [provider]  citalopram (CELEXA) 10 MG tablet Take 1 tablet (10 mg total) by mouth at bedtime. 10/14/17   Biagio Borg, MD  ezetimibe (ZETIA) 10 MG tablet TAKE ONE TABLET BY MOUTH ONCE DAILY 04/08/17   Bensimhon, Shaune Pascal, MD  ferric citrate (AURYXIA) 1 GM 210 MG(Fe) tablet Take 420 mg by mouth 3 (three) times daily with meals.    [provider]  gabapentin (NEURONTIN) 300 MG capsule Take 1 capsule (300 mg total) by mouth at bedtime. 07/05/17   Domenic Polite, MD  hydroxypropyl methylcellulose / hypromellose (ISOPTO TEARS / GONIOVISC) 2.5 % ophthalmic solution Place 1 drop into both eyes 3 (three) times daily as needed for dry eyes. 05/03/17   Biagio Borg, MD  methimazole (TAPAZOLE) 5 MG tablet Take 0.5 tablets (2.5 mg total) by mouth daily. 05/16/18   Renato Shin, MD  midodrine (PROAMATINE) 10 MG tablet Take 1 tablet (10 mg total) by mouth See admin instructions. Once a day only on dialysis days (Tues/Thurs/Sat) 02/25/18   Bensimhon, Shaune Pascal, MD  multivitamin (RENA-VIT) TABS tablet Take 1 tablet by mouth daily. Reported on 10/24/2015    [provider]  pantoprazole (PROTONIX) 40 MG tablet Take 40 mg by mouth every evening.  08/31/16    [provider]  polyethylene glycol (MIRALAX / GLYCOLAX) packet Take 17 g by mouth daily. Patient taking differently: Take 17 g by mouth daily as needed for mild constipation.  05/23/17   Florencia Reasons, MD  SENSIPAR 90 MG tablet Take 90 mg by mouth 2 (two) times daily. 09/18/16   [provider]  [facility] 625 MG tablet TAKE THREE TABLETS BY MOUTH TWICE DAILY WITH MEALS 10/04/17   Biagio Borg, MD  zolpidem (AMBIEN) 5 MG tablet TAKE 1 TABLET BY MOUTH AT BEDTIME AS NEEDED FOR SLEEP 03/27/18   Biagio Borg, MD    Family History Family History  Problem Relation Age of Onset  . Heart disease Sister   . Thyroid nodules Sister   . Heart disease Father   . Diabetes Father   . Kidney failure Father   . Hypertension Father   . Healthy Child   . Healthy Child   . Healthy Child   . Cancer Neg Hx     Social History Social History   Tobacco Use  . Smoking status: Former Smoker    Packs/day: 1.00    Years: 25.00    Pack years: 25.00    Types: Cigarettes    Last attempt to quit: 10/01/2005    Years since quitting: 12.6  . Smokeless tobacco: Former Systems developer    Quit date: 10/01/2005  . Tobacco comment: Quit smoking 2007 Smoked x 25 years 1/2 ppd.  Substance Use Topics  . Alcohol use: No    Alcohol/week: 0.0 standard drinks  . Drug use: No     Allergies   Cephalexin; Statins; and Ciprofloxacin   Review of Systems Review of Systems  Respiratory: Positive for cough and shortness of breath.   Neurological: Positive for weakness.  All other systems reviewed and are negative.    Physical Exam Updated Vital Signs BP (!) 98/55   Pulse 94   Temp (!) 102.5 F (39.2 C) (Oral)   Resp 18   SpO2 (!) 89%   Physical Exam  Constitutional: He is oriented to person, place, and time. He appears well-developed and well-nourished.  HENT:  Head: Normocephalic and atraumatic.  Mouth/Throat: Oropharynx is clear and moist.  Eyes: Pupils are equal, round, and reactive to light.  Conjunctivae and EOM are normal.  Neck: Normal range of motion.  Cardiovascular: Normal rate, regular rhythm and normal heart sounds.  Pulmonary/Chest: Effort normal and breath sounds normal. He has no decreased breath sounds. He has no wheezes.  Abdominal: Soft. Bowel sounds are normal.  Genitourinary:  Genitourinary Comments: Exam chaperoned by RN Normal rectal tone, no external hemorrhoids noted, brown stool with specks of black on DRE  Musculoskeletal: Normal range of motion.  Fistula RUE with thrill noted, no bleeding or signs of infection  Neurological: He is alert and oriented to person, place, and time.  Skin: Skin is warm and dry.  Psychiatric: He has a normal mood and affect.  Nursing note and vitals reviewed.    ED Treatments / Results  Labs (all labs ordered are listed, but only abnormal results are displayed) Labs Reviewed  COMPREHENSIVE METABOLIC PANEL - Abnormal; Notable for the following components:      Result Value   Glucose, Bld 148 (*)    BUN 25 (*)    Creatinine, Ser 7.22 (*)    Calcium 7.7 (*)    Albumin 2.9 (*)    ALT 72 (*)    Alkaline Phosphatase 243 (*)    GFR calc non Af Amer 7 (*)    GFR calc Af Amer 8 (*)    All other components within normal limits  CBC WITH DIFFERENTIAL/PLATELET - Abnormal; Notable for the following components:   WBC 11.2 (*)    RBC 2.76 (*)    Hemoglobin 8.6 (*)    HCT 27.5 (*)    RDW 17.0 (*)    Neutro Abs 8.5 (*)    Monocytes Absolute 1.7 (*)    All other components within normal limits  POC OCCULT BLOOD, ED - Abnormal; Notable for the following components:   Fecal Occult Bld POSITIVE (*)    All other components  within normal limits  CULTURE, BLOOD (ROUTINE X 2)  CULTURE, BLOOD (ROUTINE X 2)  PROTIME-INR  I-STAT CG4 LACTIC ACID, ED    EKG None  Radiology Dg Chest 2 View  Result Date: 06/10/2018 CLINICAL DATA:  Shortness of breath. EXAM: CHEST - 2 VIEW COMPARISON:  Radiograph 01/15/2018 FINDINGS: Patchy and  confluent consolidation in the right lower lobe, new from prior exam. Upper normal heart size normal mediastinal contours. Aortic atherosclerosis. No pulmonary edema, pleural effusion or pneumothorax. Vascular stent in the right axilla. Degenerative change of the shoulders. IMPRESSION: Patchy and confluent consolidation in the right lower lobe, suspicious for pneumonia. Followup PA and lateral chest X-ray is recommended in 3-4 weeks following trial of antibiotic therapy to ensure resolution and exclude underlying malignancy. Electronically Signed   By: Keith Rake M.D.   On: 06/10/2018 23:17    Procedures Procedures (including critical care time)  Medications Ordered in ED Medications - No data to display   Initial Impression / Assessment and Plan / ED Course  I have reviewed the triage vital signs and the nursing notes.  Pertinent labs & imaging results that were available during my care of the patient were reviewed by me and considered in my medical decision making (see chart for details).  67 year old male here with generalized weakness, shortness of breath, new fever.  Wife states he has been complaining of fatigue for several weeks, worse this evening where he was unable to get up out of his chair.  EMS was called found to be febrile and hypoxic in the 80s on room air.  Does not generally require home oxygen.  On arrival patient remains febrile, oxygen saturations are in the 90s with 2 L supplemental oxygen.  Wife reports possible sick contacts at the dialysis center.  He had full treatment today with some minor bleeding after but able to be stopped.  Labs and chest x-ray pending.  Labs notable for 3 g drop in hemoglobin over the past few weeks, last on record 11.8, today 8.6.  Denies blood in the stool but does have hx of recurrent GI/rectal bleeding, followed by Dr. Paulita Fujita.  Hemoccult is positive.  No abdominal pain.  CXR with RLL pneumonia.  Normal lactate, WBC count 11.2.  Blood  cultures sent.  Patient no longer makes urine.  Will require admission.  Patient has anaphylaxis to Keflex as well as allergic reaction to fluoroquinolones.  Will start on vancomycin and Zosyn, he has tolerated penicillins in the past without issue.    Discussed with Dr. Hal Hope-- will admit.  Patient remains hemodynamically stable, feel GI to be consulted in the morning if needed due to his ongoing rectal bleeding.  Will also need dialysis scheduled for Thursday if still inpatient.  Final Clinical Impressions(s) / ED Diagnoses   Final diagnoses:  Community acquired pneumonia of right lower lobe of lung [facility])  Rectal bleeding  Generalized weakness    ED Discharge Orders    None       Larene Pickett, PA-C 06/11/18 0052    Lacretia Leigh, MD 06/11/18 [phone]

## 2018-06-11 ENCOUNTER — Inpatient Hospital Stay (HOSPITAL_COMMUNITY): Payer: Medicare Other

## 2018-06-11 ENCOUNTER — Encounter (HOSPITAL_COMMUNITY): Payer: Self-pay | Admitting: Internal Medicine

## 2018-06-11 DIAGNOSIS — K922 Gastrointestinal hemorrhage, unspecified: Secondary | ICD-10-CM

## 2018-06-11 DIAGNOSIS — I120 Hypertensive chronic kidney disease with stage 5 chronic kidney disease or end stage renal disease: Secondary | ICD-10-CM | POA: Diagnosis not present

## 2018-06-11 DIAGNOSIS — F329 Major depressive disorder, single episode, unspecified: Secondary | ICD-10-CM | POA: Diagnosis present

## 2018-06-11 DIAGNOSIS — Z992 Dependence on renal dialysis: Secondary | ICD-10-CM | POA: Diagnosis not present

## 2018-06-11 DIAGNOSIS — I255 Ischemic cardiomyopathy: Secondary | ICD-10-CM | POA: Diagnosis present

## 2018-06-11 DIAGNOSIS — K625 Hemorrhage of anus and rectum: Secondary | ICD-10-CM

## 2018-06-11 DIAGNOSIS — D62 Acute posthemorrhagic anemia: Secondary | ICD-10-CM

## 2018-06-11 DIAGNOSIS — M109 Gout, unspecified: Secondary | ICD-10-CM | POA: Diagnosis present

## 2018-06-11 DIAGNOSIS — W1830XA Fall on same level, unspecified, initial encounter: Secondary | ICD-10-CM | POA: Diagnosis not present

## 2018-06-11 DIAGNOSIS — D649 Anemia, unspecified: Secondary | ICD-10-CM | POA: Diagnosis not present

## 2018-06-11 DIAGNOSIS — Z881 Allergy status to other antibiotic agents status: Secondary | ICD-10-CM | POA: Diagnosis not present

## 2018-06-11 DIAGNOSIS — I5042 Chronic combined systolic (congestive) and diastolic (congestive) heart failure: Secondary | ICD-10-CM | POA: Diagnosis present

## 2018-06-11 DIAGNOSIS — D500 Iron deficiency anemia secondary to blood loss (chronic): Secondary | ICD-10-CM | POA: Diagnosis not present

## 2018-06-11 DIAGNOSIS — I252 Old myocardial infarction: Secondary | ICD-10-CM | POA: Diagnosis not present

## 2018-06-11 DIAGNOSIS — R195 Other fecal abnormalities: Secondary | ICD-10-CM | POA: Diagnosis not present

## 2018-06-11 DIAGNOSIS — Z955 Presence of coronary angioplasty implant and graft: Secondary | ICD-10-CM | POA: Diagnosis not present

## 2018-06-11 DIAGNOSIS — S0990XA Unspecified injury of head, initial encounter: Secondary | ICD-10-CM | POA: Diagnosis not present

## 2018-06-11 DIAGNOSIS — E1122 Type 2 diabetes mellitus with diabetic chronic kidney disease: Secondary | ICD-10-CM | POA: Diagnosis present

## 2018-06-11 DIAGNOSIS — N186 End stage renal disease: Secondary | ICD-10-CM | POA: Diagnosis present

## 2018-06-11 DIAGNOSIS — J189 Pneumonia, unspecified organism: Secondary | ICD-10-CM | POA: Diagnosis present

## 2018-06-11 DIAGNOSIS — N400 Enlarged prostate without lower urinary tract symptoms: Secondary | ICD-10-CM | POA: Diagnosis present

## 2018-06-11 DIAGNOSIS — Z923 Personal history of irradiation: Secondary | ICD-10-CM | POA: Diagnosis not present

## 2018-06-11 DIAGNOSIS — Q2730 Arteriovenous malformation, site unspecified: Secondary | ICD-10-CM | POA: Diagnosis not present

## 2018-06-11 DIAGNOSIS — E1151 Type 2 diabetes mellitus with diabetic peripheral angiopathy without gangrene: Secondary | ICD-10-CM | POA: Diagnosis present

## 2018-06-11 DIAGNOSIS — Z888 Allergy status to other drugs, medicaments and biological substances status: Secondary | ICD-10-CM | POA: Diagnosis not present

## 2018-06-11 DIAGNOSIS — Y95 Nosocomial condition: Secondary | ICD-10-CM | POA: Diagnosis present

## 2018-06-11 DIAGNOSIS — I2510 Atherosclerotic heart disease of native coronary artery without angina pectoris: Secondary | ICD-10-CM | POA: Diagnosis present

## 2018-06-11 DIAGNOSIS — E0590 Thyrotoxicosis, unspecified without thyrotoxic crisis or storm: Secondary | ICD-10-CM | POA: Diagnosis present

## 2018-06-11 DIAGNOSIS — I110 Hypertensive heart disease with heart failure: Secondary | ICD-10-CM | POA: Diagnosis not present

## 2018-06-11 DIAGNOSIS — E1140 Type 2 diabetes mellitus with diabetic neuropathy, unspecified: Secondary | ICD-10-CM | POA: Diagnosis present

## 2018-06-11 DIAGNOSIS — Y92230 Patient room in hospital as the place of occurrence of the external cause: Secondary | ICD-10-CM | POA: Diagnosis not present

## 2018-06-11 DIAGNOSIS — N2581 Secondary hyperparathyroidism of renal origin: Secondary | ICD-10-CM | POA: Diagnosis present

## 2018-06-11 DIAGNOSIS — R4182 Altered mental status, unspecified: Secondary | ICD-10-CM | POA: Diagnosis not present

## 2018-06-11 DIAGNOSIS — D631 Anemia in chronic kidney disease: Secondary | ICD-10-CM | POA: Diagnosis present

## 2018-06-11 DIAGNOSIS — K921 Melena: Secondary | ICD-10-CM | POA: Diagnosis present

## 2018-06-11 DIAGNOSIS — I132 Hypertensive heart and chronic kidney disease with heart failure and with stage 5 chronic kidney disease, or end stage renal disease: Secondary | ICD-10-CM | POA: Diagnosis present

## 2018-06-11 LAB — CBC
HCT: 25.4 % — ABNORMAL LOW (ref 39.0–52.0)
HEMATOCRIT: 29.8 % — AB (ref 39.0–52.0)
Hemoglobin: 7.9 g/dL — ABNORMAL LOW (ref 13.0–17.0)
Hemoglobin: 9.2 g/dL — ABNORMAL LOW (ref 13.0–17.0)
MCH: 31.2 pg (ref 26.0–34.0)
MCH: 31.2 pg (ref 26.0–34.0)
MCHC: 31.1 g/dL (ref 30.0–36.0)
MCHC: 30.9 g/dL (ref 30.0–36.0)
MCV: 100.4 fL — AB (ref 78.0–100.0)
MCV: 101 fL — ABNORMAL HIGH (ref 78.0–100.0)
PLATELETS: 278 10*3/uL (ref 150–400)
Platelets: 294 10*3/uL (ref 150–400)
RBC: 2.53 MIL/uL — AB (ref 4.22–5.81)
RBC: 2.95 MIL/uL — ABNORMAL LOW (ref 4.22–5.81)
RDW: 17 % — AB (ref 11.5–15.5)
RDW: 17.1 % — ABNORMAL HIGH (ref 11.5–15.5)
WBC: 10.8 10*3/uL — ABNORMAL HIGH (ref 4.0–10.5)
WBC: 12.5 10*3/uL — ABNORMAL HIGH (ref 4.0–10.5)

## 2018-06-11 LAB — TYPE AND SCREEN
ABO/RH(D): O POS
ANTIBODY SCREEN: NEGATIVE

## 2018-06-11 LAB — COMPREHENSIVE METABOLIC PANEL
ALBUMIN: 2.6 g/dL — AB (ref 3.5–5.0)
ALT: 61 U/L — AB (ref 0–44)
AST: 24 U/L (ref 15–41)
Alkaline Phosphatase: 220 U/L — ABNORMAL HIGH (ref 38–126)
Anion gap: 13 (ref 5–15)
BUN: 31 mg/dL — ABNORMAL HIGH (ref 8–23)
CHLORIDE: 101 mmol/L (ref 98–111)
CO2: 25 mmol/L (ref 22–32)
CREATININE: 7.88 mg/dL — AB (ref 0.61–1.24)
Calcium: 7.7 mg/dL — ABNORMAL LOW (ref 8.9–10.3)
GFR calc Af Amer: 7 mL/min — ABNORMAL LOW (ref >60)
GFR calc non Af Amer: 6 mL/min — ABNORMAL LOW (ref >60)
GLUCOSE: 150 mg/dL — AB (ref 70–99)
Potassium: 4.4 mmol/L (ref 3.5–5.1)
Sodium: 139 mmol/L (ref 135–145)
Total Bilirubin: 0.9 mg/dL (ref 0.3–1.2)
Total Protein: 6.4 g/dL — ABNORMAL LOW (ref 6.5–8.1)

## 2018-06-11 LAB — GLUCOSE, CAPILLARY
GLUCOSE-CAPILLARY: 192 mg/dL — AB (ref 70–99)
Glucose-Capillary: 160 mg/dL — ABNORMAL HIGH (ref 70–99)
Glucose-Capillary: 171 mg/dL — ABNORMAL HIGH (ref 70–99)

## 2018-06-11 LAB — HIV ANTIBODY (ROUTINE TESTING W REFLEX): HIV SCREEN 4TH GENERATION: NONREACTIVE

## 2018-06-11 LAB — TROPONIN I: Troponin I: 0.04 ng/mL (ref <0.03)

## 2018-06-11 MED ORDER — PIPERACILLIN-TAZOBACTAM 3.375 G IVPB 30 MIN
3.3750 g | Freq: Once | INTRAVENOUS | Status: AC
  Administered 2018-06-11: 3.375 g via INTRAVENOUS
  Filled 2018-06-11: qty 50

## 2018-06-11 MED ORDER — ACETAMINOPHEN 650 MG RE SUPP: 650.0000 mg | Freq: Four times a day (QID) | RECTAL | Status: DC | PRN

## 2018-06-11 MED ORDER — CINACALCET HCL 30 MG PO TABS
90.0000 mg | ORAL_TABLET | Freq: Every day | ORAL | Status: DC
  Administered 2018-06-11 – 2018-06-12 (×2): 90 mg via ORAL
  Filled 2018-06-11 (×2): qty 3

## 2018-06-11 MED ORDER — GABAPENTIN 300 MG PO CAPS
300.0000 mg | ORAL_CAPSULE | Freq: Every day | ORAL | Status: DC
  Administered 2018-06-11 – 2018-06-12 (×2): 300 mg via ORAL
  Filled 2018-06-11 (×2): qty 1

## 2018-06-11 MED ORDER — FERRIC CITRATE 1 GM 210 MG(FE) PO TABS
420.0000 mg | ORAL_TABLET | Freq: Three times a day (TID) | ORAL | Status: DC
  Administered 2018-06-11 – 2018-06-14 (×6): 420 mg via ORAL
  Filled 2018-06-11 (×9): qty 2

## 2018-06-11 MED ORDER — RENA-VITE PO TABS
1.0000 | ORAL_TABLET | Freq: Every day | ORAL | Status: DC
  Administered 2018-06-11 – 2018-06-14 (×4): 1 via ORAL
  Filled 2018-06-11 (×5): qty 1

## 2018-06-11 MED ORDER — EZETIMIBE 10 MG PO TABS
10.0000 mg | ORAL_TABLET | Freq: Every day | ORAL | Status: DC
  Administered 2018-06-11 – 2018-06-15 (×4): 10 mg via ORAL
  Filled 2018-06-11 (×5): qty 1

## 2018-06-11 MED ORDER — COLESEVELAM HCL 625 MG PO TABS
625.0000 mg | ORAL_TABLET | Freq: Every day | ORAL | Status: DC
  Administered 2018-06-13 – 2018-06-15 (×3): 625 mg via ORAL
  Filled 2018-06-11 (×4): qty 1

## 2018-06-11 MED ORDER — FAMOTIDINE IN NACL 20-0.9 MG/50ML-% IV SOLN
20.0000 mg | INTRAVENOUS | Status: DC
  Administered 2018-06-11 – 2018-06-14 (×3): 20 mg via INTRAVENOUS
  Filled 2018-06-11 (×5): qty 50

## 2018-06-11 MED ORDER — VANCOMYCIN HCL IN DEXTROSE 1-5 GM/200ML-% IV SOLN
1000.0000 mg | INTRAVENOUS | Status: DC
  Administered 2018-06-12: 1000 mg via INTRAVENOUS
  Filled 2018-06-11 (×2): qty 200

## 2018-06-11 MED ORDER — ACETAMINOPHEN 325 MG PO TABS
650.0000 mg | ORAL_TABLET | Freq: Four times a day (QID) | ORAL | Status: DC | PRN
  Administered 2018-06-11: 650 mg via ORAL
  Filled 2018-06-11: qty 2

## 2018-06-11 MED ORDER — INSULIN ASPART 100 UNIT/ML ~~LOC~~ SOLN
0.0000 [IU] | Freq: Every day | SUBCUTANEOUS | Status: DC
  Administered 2018-06-14: 2 [IU] via SUBCUTANEOUS

## 2018-06-11 MED ORDER — FAMOTIDINE IN NACL 20-0.9 MG/50ML-% IV SOLN: 20.0000 mg | Freq: Two times a day (BID) | INTRAVENOUS | Status: DC

## 2018-06-11 MED ORDER — ONDANSETRON HCL 4 MG PO TABS: 4.0000 mg | ORAL_TABLET | Freq: Four times a day (QID) | ORAL | Status: DC | PRN

## 2018-06-11 MED ORDER — ONDANSETRON HCL 4 MG/2ML IJ SOLN: 4.0000 mg | Freq: Four times a day (QID) | INTRAMUSCULAR | Status: DC | PRN

## 2018-06-11 MED ORDER — CHLORHEXIDINE GLUCONATE CLOTH 2 % EX PADS
6.0000 | MEDICATED_PAD | Freq: Every day | CUTANEOUS | Status: DC
  Administered 2018-06-12: 6 via TOPICAL

## 2018-06-11 MED ORDER — PIPERACILLIN-TAZOBACTAM 3.375 G IVPB
3.3750 g | Freq: Two times a day (BID) | INTRAVENOUS | Status: DC
  Administered 2018-06-12 – 2018-06-14 (×5): 3.375 g via INTRAVENOUS
  Filled 2018-06-11 (×5): qty 50

## 2018-06-11 MED ORDER — INSULIN ASPART 100 UNIT/ML ~~LOC~~ SOLN
0.0000 [IU] | Freq: Three times a day (TID) | SUBCUTANEOUS | Status: DC
  Administered 2018-06-11: 2 [IU] via SUBCUTANEOUS
  Administered 2018-06-12: 1 [IU] via SUBCUTANEOUS
  Administered 2018-06-13: 2 [IU] via SUBCUTANEOUS
  Administered 2018-06-13: 0 [IU] via SUBCUTANEOUS
  Administered 2018-06-14 – 2018-06-15 (×4): 1 [IU] via SUBCUTANEOUS

## 2018-06-11 MED ORDER — METHIMAZOLE 5 MG PO TABS
2.5000 mg | ORAL_TABLET | Freq: Every day | ORAL | Status: DC
  Administered 2018-06-11 – 2018-06-15 (×4): 2.5 mg via ORAL
  Filled 2018-06-11 (×5): qty 1

## 2018-06-11 MED ORDER — HYPROMELLOSE (GONIOSCOPIC) 2.5 % OP SOLN
1.0000 [drp] | Freq: Three times a day (TID) | OPHTHALMIC | Status: DC | PRN
  Administered 2018-06-14: 1 [drp] via OPHTHALMIC
  Filled 2018-06-11 (×2): qty 15

## 2018-06-11 MED ORDER — DOXERCALCIFEROL 4 MCG/2ML IV SOLN
4.0000 ug | INTRAVENOUS | Status: DC
  Administered 2018-06-12 – 2018-06-15 (×2): 4 ug via INTRAVENOUS
  Filled 2018-06-11 (×2): qty 2

## 2018-06-11 MED ORDER — VANCOMYCIN HCL 10 G IV SOLR
2000.0000 mg | Freq: Once | INTRAVENOUS | Status: AC
  Administered 2018-06-11: 2000 mg via INTRAVENOUS
  Filled 2018-06-11: qty 2000

## 2018-06-11 MED ORDER — GABAPENTIN 300 MG PO CAPS
300.0000 mg | ORAL_CAPSULE | Freq: Once | ORAL | Status: AC
  Administered 2018-06-11: 300 mg via ORAL
  Filled 2018-06-11: qty 1

## 2018-06-11 MED ORDER — ZOLPIDEM TARTRATE 5 MG PO TABS
5.0000 mg | ORAL_TABLET | Freq: Once | ORAL | Status: AC
  Administered 2018-06-11: 5 mg via ORAL
  Filled 2018-06-11: qty 1

## 2018-06-11 NOTE — ED Notes (Signed)
Attempted report 

## 2018-06-11 NOTE — ED Notes (Signed)
Breakfast tray ordered 

## 2018-06-11 NOTE — H&P (Addendum)
History and Physical    [name] ZOX:[id number] DOB: 03-07-1951 DOA: 06/10/2018  PCP: Biagio Borg, MD  Patient coming from: Home.  Chief Complaint: Fever chills and shortness of breath.  HPI: [name] is a 66 y.o. male with history of ESRD on hemodialysis on Tuesday Thursdays and Saturday, CAD status post stenting, AVNRT status post ablation, chronic combined systolic and diastolic CHF, chronic anemia, gout, hyperthyroidism was brought to the ER after patient had fever and chills last night.  Patient had dialysis yesterday morning as scheduled following which patient had some bleeding from the AV fistula site which has to be placed on pressure for some time.  Following which patient did well and went home.  At home during the evening patient started developing fever chills and shortness of breath.  Denies any chest pain or productive cough.  Nausea vomiting or diarrhea.  ED Course: In the ER patient was febrile with temperature 102 F and chest x-ray shows infiltrates concerning for pneumonia.  Blood cultures were obtained and started on empiric antibiotics for pneumonia.  Blood work also shows a hemoglobin drop of 3 g from last month.  Stool for occult blood has been positive.  Patient has had previous GI bleed from radiation proctitis and had a sigmoidoscopy last October in 2018 which showed ulceration in the rectum.  Patient as such has not noticed any bleed.  Patient has been having fatigue for last few months.  Review of Systems: As per HPI, rest all negative.   Past Medical History:  Diagnosis Date  . Allergic rhinitis, cause unspecified 02/24/2014  . Anemia 06/16/2011  . BENIGN PROSTATIC HYPERTROPHY 10/14/2009  . CAD, NATIVE VESSEL 02/06/2009   a. 06/2007 s/p Taxus DES to the RCA;  b. 08/2016 NSTEMI in setting of SVT/PCI: LM 30ost, LAD 3m (3.0x16 Synergy DES), LCX 57m, OM1 60, RI 40, RCA 70p/m, 75m - not amenable to PCI.  [name] Cervical radiculopathy, chronic 02/23/2016   Right  c5-6 by NCS/EMG  . Chronic combined systolic (congestive) and diastolic (congestive) heart failure ([place])    a. 10/2016 Echo: EF 40-45%, Gr2 DD. mildly dil LA.  [name] COLONIC POLYPS, HX OF 10/14/2009  . Dementia [id number]  . Depression 09/24/2015  . DIABETES MELLITUS, TYPE II 02/01/2010  . DYSLIPIDEMIA 06/18/2007  . ESRD (end stage renal disease) on dialysis ([place]) 08/04/2010   "TTS;  " (04/18/2015)  . FOOT PAIN 08/12/2008  . GAIT DISTURBANCE 03/03/2010  . GASTROENTERITIS, VIRAL 10/14/2009  . GERD 06/18/2007  . GOITER, MULTINODULAR 12/26/2007  . GOUT 06/18/2007  . GYNECOMASTIA 07/17/2010  . Hemodialysis access, fistula mature [facility])    Dialysis T-Th-Sa ([place]) Right upper arm fistula  . Hyperlipidemia 10/16/2011  . Hyperparathyroidism, secondary ([place]) 06/16/2011  . HYPERTENSION 06/18/2007  . Hyperthyroidism   . Hypocalcemia 06/07/2010  . Ischemic cardiomyopathy    a. 10/2016 Echo: EF 40-45%.  . Lumbar stenosis with neurogenic claudication   . ONYCHOMYCOSIS, TOENAILS 12/26/2007  . OSA on CPAP 10/16/2011  . Other malaise and fatigue 11/24/2009  . PERIPHERAL NEUROPATHY 06/18/2007  . Prostate cancer ([place])   . PSVT (paroxysmal supraventricular tachycardia) ([place])    a. 95/6213 complicated by NSTEMI;  b. 11/2016 Treated w/ adenosine in ED;  c. 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  [name] PULMONARY NODULE, RIGHT LOWER LOBE 06/08/2009  . Sleep apnea    cpap machine and o2  . TRANSAMINASES, SERUM, ELEVATED 02/01/2010  . Transfusion history    none recent  .  Unspecified hypotension 01/30/2010    Past Surgical History:  Procedure Laterality Date  . A/V SHUNTOGRAM N/A 04/07/2018   Procedure: A/V SHUNTOGRAM;  Surgeon: Waynetta Sandy, MD;  Location: [place] [facility];  Service: Cardiovascular;  Laterality: N/A;  . ARTERIOVENOUS GRAFT PLACEMENT Right 2009   forearm/notes 02/01/2011  . AV FISTULA PLACEMENT  11/07/2011   Procedure: INSERTION OF ARTERIOVENOUS (AV) GORE-TEX GRAFT ARM;  Surgeon: Tinnie Gens,  MD;  Location: [place];  Service: Vascular;  Laterality: Left;  . BACK SURGERY  1998  . BASCILIC VEIN TRANSPOSITION Right 02/27/2013   Procedure: BASCILIC VEIN TRANSPOSITION;  Surgeon: Mal Misty, MD;  Location: [place];  Service: Vascular;  Laterality: Right;  Right Basilic Vein Transposition   . CARDIAC CATHETERIZATION N/A 08/06/2016   Procedure: Left Heart Cath and Coronary Angiography;  Surgeon: Jolaine Artist, MD;  Location: [place] [facility];  Service: Cardiovascular;  Laterality: N/A;  . CARDIAC CATHETERIZATION N/A 08/07/2016   Procedure: Coronary/Graft Atherectomy-CSI LAD;  Surgeon: Peter M Martinique, MD;  Location: [place] [facility];  Service: Cardiovascular;  Laterality: N/A;  . CERVICAL SPINE SURGERY  2/09   "to repair nerve problems in my left arm"  . CHOLECYSTECTOMY    . COLONOSCOPY WITH PROPOFOL N/A 04/26/2017   Procedure: COLONOSCOPY WITH PROPOFOL;  Surgeon: Otis Brace, MD;  Location: [place];  Service: Gastroenterology;  Laterality: N/A;  . CORONARY ANGIOPLASTY WITH STENT PLACEMENT  06/11/2008  . CORONARY ANGIOPLASTY WITH STENT PLACEMENT  06/2007   TAXUS stent to RCA/notes 01/31/2011  . ESOPHAGOGASTRODUODENOSCOPY  09/28/2011   Procedure: ESOPHAGOGASTRODUODENOSCOPY (EGD);  Surgeon: Missy Sabins, MD;  Location[facility] ENDOSCOPY;  Service: Endoscopy;  Laterality: N/A;  . ESOPHAGOGASTRODUODENOSCOPY N/A 04/07/2015   Procedure: ESOPHAGOGASTRODUODENOSCOPY (EGD);  Surgeon: Teena Irani, MD;  Location: [facility];  Service: Endoscopy;  Laterality: N/A;  . ESOPHAGOGASTRODUODENOSCOPY N/A 04/19/2015   Procedure: ESOPHAGOGASTRODUODENOSCOPY (EGD);  Surgeon: Arta Silence, MD;  Location: [facility] ENDOSCOPY;  Service: Endoscopy;  Laterality: N/A;  . FLEXIBLE SIGMOIDOSCOPY N/A 05/21/2017   Procedure: Conni Elliot;  Surgeon: Clarene Essex, MD;  Location: [place];  Service: Endoscopy;  Laterality: N/A;  . FLEXIBLE SIGMOIDOSCOPY Left 07/02/2017   Procedure: FLEXIBLE SIGMOIDOSCOPY;   Surgeon: Laurence Spates, MD;  Location: [place];  Service: Endoscopy;  Laterality: Left;  . FOREIGN BODY REMOVAL  09/2003   via upper endoscopy/notes 02/12/2011  . GIVENS CAPSULE STUDY  09/30/2011   Procedure: GIVENS CAPSULE STUDY;  Surgeon: Jeryl Columbia, MD;  Location: [name] ENDOSCOPY;  Service: Endoscopy;  Laterality: N/A;  . INSERTION OF DIALYSIS CATHETER Right 2014  . INSERTION OF DIALYSIS CATHETER Left 02/11/2013   Procedure: INSERTION OF DIALYSIS CATHETER;  Surgeon: Conrad Garden, MD;  Location: [place];  Service: Vascular;  Laterality: Left;  Ultrasound guided  . LUMBAR LAMINECTOMY/DECOMPRESSION MICRODISCECTOMY Bilateral 07/31/2017   Procedure: LAMINECTOMY AND FORAMINOTOMY- BILATERAL LUMBAR TWO- LUMBAR THREE;  Surgeon: Earnie Larsson, MD;  Location: [place];  Service: Neurosurgery;  Laterality: Bilateral;  LAMINECTOMY AND FORAMINOTOMY- BILATERAL LUMBAR 2- LUMBAR 3  . PERIPHERAL VASCULAR BALLOON ANGIOPLASTY  04/07/2018   Procedure: PERIPHERAL VASCULAR BALLOON ANGIOPLASTY;  Surgeon: Waynetta Sandy, MD;  Location: [place] [facility];  Service: Cardiovascular;;  RUE AVF  . REMOVAL OF A DIALYSIS CATHETER Right 02/11/2013   Procedure: REMOVAL OF A DIALYSIS CATHETER;  Surgeon: Conrad [place], MD;  Location: [place];  Service: Vascular;  Laterality: Right;  . SAVORY DILATION N/A 04/07/2015   Procedure: SAVORY DILATION;  Surgeon: [name]  Amedeo Plenty, MD;  Location: [facility];  Service: Endoscopy;  Laterality: N/A;  . SHUNTOGRAM N/A 09/20/2011   Procedure: Earney Mallet;  Surgeon: Conrad [place], MD;  Location: [facility] CATH LAB;  Service: Cardiovascular;  Laterality: N/A;  . SVT ABLATION N/A 11/26/2016   Procedure: SVT Ablation;  Surgeon: Will Meredith Leeds, MD;  Location: [place] CV LAB;  Service: Cardiovascular;  Laterality: N/A;  . TONSILLECTOMY    . TOTAL KNEE ARTHROPLASTY Right 08/02/2015   Procedure: TOTAL KNEE ARTHROPLASTY;  Surgeon: Renette Butters, MD;  Location: [place];  Service: Orthopedics;  Laterality:  Right;  . VENOGRAM N/A 01/26/2013   Procedure: VENOGRAM;  Surgeon: Angelia Mould, MD;  Location: [facility] CATH LAB;  Service: Cardiovascular;  Laterality: N/A;     reports that he quit smoking about 12 years ago. His smoking use included cigarettes. He has a 25.00 pack-year smoking history. He quit smokeless tobacco use about 12 years ago. He reports that he does not drink alcohol or use drugs.  Allergies  Allergen Reactions  . Cephalexin Swelling and Other (See Comments)    Tongue swelling, but no breathing issues  . Statins Other (See Comments)    Weak muscles  . Ciprofloxacin Rash    Family History  Problem Relation Age of Onset  . Heart disease Sister   . Thyroid nodules Sister   . Heart disease Father   . Diabetes Father   . Kidney failure Father   . Hypertension Father   . Healthy Child   . Healthy Child   . Healthy Child   . Cancer Neg Hx     Prior to Admission medications   Medication Sig Start Date End Date Taking? Authorizing Provider  acetaminophen (TYLENOL) 325 MG tablet Take 650 mg by mouth every 6 (six) hours as needed for mild pain or headache.    Yes [provider]  Alirocumab (PRALUENT) 150 MG/ML SOPN Inject 150 mg into the skin every 14 (fourteen) days. 03/05/18  Yes Lorretta Harp, MD  allopurinol (ZYLOPRIM) 100 MG tablet TAKE ONE TABLET BY MOUTH ONCE DAILY 10/14/17  Yes Biagio Borg, MD  aspirin EC 81 MG tablet Take 81 mg by mouth daily.   Yes [provider]  betamethasone dipropionate (DIPROLENE) 0.05 % cream Apply 1 application topically daily as needed (leg sores).   Yes [provider]  citalopram (CELEXA) 10 MG tablet Take 1 tablet (10 mg total) by mouth at bedtime. 10/14/17  Yes Biagio Borg, MD  ezetimibe (ZETIA) 10 MG tablet TAKE ONE TABLET BY MOUTH ONCE DAILY 04/08/17  Yes Bensimhon, Shaune Pascal, MD  ferric citrate (AURYXIA) 1 GM 210 MG(Fe) tablet Take 210-420 mg by mouth See admin instructions. Take 420 mg by mouth two  times a day and 210 mg with each snack   Yes [provider]  gabapentin (NEURONTIN) 300 MG capsule Take 1 capsule (300 mg total) by mouth at bedtime. Patient taking differently: Take 600 mg by mouth at bedtime.  07/05/17  Yes Domenic Polite, MD  hydroxypropyl methylcellulose / hypromellose (ISOPTO TEARS / GONIOVISC) 2.5 % ophthalmic solution Place 1 drop into both eyes 3 (three) times daily as needed for dry eyes. 05/03/17  Yes Biagio Borg, MD  methimazole (TAPAZOLE) 5 MG tablet Take 0.5 tablets (2.5 mg total) by mouth daily. 05/16/18  Yes Renato Shin, MD  midodrine (PROAMATINE) 10 MG tablet Take 1 tablet (10 mg total) by mouth See admin instructions. Once a day only on dialysis  days (Tues/Thurs/Sat) Patient taking differently: Take 10 mg by mouth See admin instructions. Take 10 mg by mouth prior to dialysis only on Tues/Thurs/Sat 02/25/18  Yes Bensimhon, Shaune Pascal, MD  multivitamin (RENA-VIT) TABS tablet Take 1 tablet by mouth daily. Reported on 10/24/2015   Yes [provider]  pantoprazole (PROTONIX) 40 MG tablet Take 40 mg by mouth every evening.  08/31/16  Yes [provider]  polyethylene glycol (MIRALAX / GLYCOLAX) packet Take 17 g by mouth daily. Patient taking differently: Take 17 g by mouth daily as needed for mild constipation.  05/23/17  Yes Florencia Reasons, MD  SENSIPAR 90 MG tablet Take 90 mg by mouth 2 (two) times daily. 09/18/16  Yes [provider]  WELCHOL 625 MG tablet TAKE THREE TABLETS BY MOUTH TWICE DAILY WITH MEALS 10/04/17  Yes Biagio Borg, MD  zolpidem (AMBIEN) 5 MG tablet TAKE 1 TABLET BY MOUTH AT BEDTIME AS NEEDED FOR SLEEP Patient taking differently: Take 5 mg by mouth at bedtime.  03/27/18  Yes Biagio Borg, MD    Physical Exam: Vitals:   06/11/18 0022 06/11/18 0130 06/11/18 0200 06/11/18 0300  BP:  (!) 82/54 (!) 105/58 110/61  Pulse:  88 87 84  Resp:  19 20 18   Temp: (!) 101.3 F (38.5 C)     TempSrc: Oral     SpO2:  99% 97% 98%       Constitutional: Moderately built and nourished. Vitals:   06/11/18 0022 06/11/18 0130 06/11/18 0200 06/11/18 0300  BP:  (!) 82/54 (!) 105/58 110/61  Pulse:  88 87 84  Resp:  19 20 18   Temp: (!) 101.3 F (38.5 C)     TempSrc: Oral     SpO2:  99% 97% 98%   Eyes: Anicteric no pallor. ENMT: No discharge from the ears eyes nose or mouth. Neck: No mass felt.  No neck rigidity.  No JVD appreciated. Respiratory: No rhonchi or crepitations. Cardiovascular: S1-S2 heard no murmurs appreciated. Abdomen: Soft nontender bowel sounds present. Musculoskeletal: No edema.  No joint effusion. Skin: No rash.  Skin appears warm. Neurologic: Alert awake oriented to time place and person.  Moves all extremities. Psychiatric: Appears normal.  Normal affect.   Labs on Admission: I have personally reviewed following labs and imaging studies  CBC: Recent Labs  Lab 06/10/18 2232  WBC 11.2*  NEUTROABS 8.5*  HGB 8.6*  HCT 27.5*  MCV 99.6  PLT 811   Basic Metabolic Panel: Recent Labs  Lab 06/10/18 2232  NA 138  K 4.6  CL 100  CO2 25  GLUCOSE 148*  BUN 25*  CREATININE 7.22*  CALCIUM 7.7*   GFR: Estimated Creatinine Clearance: 12.2 mL/min (A) (by C-G formula based on SCr of 7.22 mg/dL (H)). Liver Function Tests: Recent Labs  Lab 06/10/18 2232  AST 32  ALT 72*  ALKPHOS 243*  BILITOT 0.8  PROT 6.9  ALBUMIN 2.9*   No results for input(s): LIPASE, AMYLASE in the last 168 hours. No results for input(s): AMMONIA in the last 168 hours. Coagulation Profile: Recent Labs  Lab 06/10/18 2232  INR 1.13   Cardiac Enzymes: No results for input(s): CKTOTAL, CKMB, CKMBINDEX, TROPONINI in the last 168 hours. BNP (last 3 results) No results for input(s): PROBNP in the last 8760 hours. HbA1C: No results for input(s): HGBA1C in the last 72 hours. CBG: No results for input(s): GLUCAP in the last 168 hours. Lipid Profile: No results for input(s): CHOL, HDL, LDLCALC, TRIG, CHOLHDL,  LDLDIRECT in the last 72 hours. Thyroid Function Tests: No results for input(s): TSH, T4TOTAL, FREET4, T3FREE, THYROIDAB in the last 72 hours. Anemia Panel: No results for input(s): VITAMINB12, FOLATE, FERRITIN, TIBC, IRON, RETICCTPCT in the last 72 hours. Urine analysis:    Component Value Date/Time   COLORURINE LT. YELLOW 06/26/2013 1200   APPEARANCEUR CLEAR 06/26/2013 1200   LABSPEC 1.020 06/26/2013 1200   PHURINE 8.5 06/26/2013 1200   GLUCOSEU 100 06/26/2013 1200   HGBUR MODERATE 06/26/2013 1200   BILIRUBINUR NEGATIVE 06/26/2013 1200   KETONESUR NEGATIVE 06/26/2013 1200   PROTEINUR >300 (A) 01/04/2010 1032   UROBILINOGEN 0.2 06/26/2013 1200   NITRITE NEGATIVE 06/26/2013 1200   LEUKOCYTESUR TRACE 06/26/2013 1200   Sepsis Labs: @LABRCNTIP (procalcitonin:4,lacticidven:4) )No results found for this or any previous visit (from the past 240 hour(s)).   Radiological Exams on Admission: Dg Chest 2 View  Result Date: 06/10/2018 CLINICAL DATA:  Shortness of breath. EXAM: CHEST - 2 VIEW COMPARISON:  Radiograph 01/15/2018 FINDINGS: Patchy and confluent consolidation in the right lower lobe, new from prior exam. Upper normal heart size normal mediastinal contours. Aortic atherosclerosis. No pulmonary edema, pleural effusion or pneumothorax. Vascular stent in the right axilla. Degenerative change of the shoulders. IMPRESSION: Patchy and confluent consolidation in the right lower lobe, suspicious for pneumonia. Followup PA and lateral chest X-ray is recommended in 3-4 weeks following trial of antibiotic therapy to ensure resolution and exclude underlying malignancy. Electronically Signed   By: Keith Rake M.D.   On: 06/10/2018 23:17   Ct Head Wo Contrast  Result Date: 06/11/2018 CLINICAL DATA:  Altered mental status. Fall getting up from recliner. EXAM: CT HEAD WITHOUT CONTRAST TECHNIQUE: Contiguous axial images were obtained from the base of the skull through the vertex without intravenous  contrast. COMPARISON:  Head CT 10/16/2016 FINDINGS: Brain: Age related atrophy. Mild chronic small vessel ischemia. No intracranial hemorrhage, mass effect, or midline shift. No hydrocephalus. The basilar cisterns are patent. No evidence of territorial infarct or acute ischemia. No extra-axial or intracranial fluid collection. Vascular: Atherosclerosis of skullbase vasculature without hyperdense vessel or abnormal calcification. Skull: No fracture or focal lesion. Sinuses/Orbits: Paranasal sinuses and mastoid air cells are clear. The visualized orbits are unremarkable. Other: None. IMPRESSION: No acute intracranial abnormality. Electronically Signed   By: Keith Rake M.D.   On: 06/11/2018 01:31    EKG: Independently reviewed.  Normal sinus rhythm with nonspecific changes in the anterior leads.  Assessment/Plan Principal Problem:   HCAP (healthcare-associated pneumonia) Active Problems:   Ischemic cardiomyopathy   Anemia associated with acute blood loss   AVM (arteriovenous malformation)   Hyperthyroidism   Malignant neoplasm of prostate ([name])   DM (diabetes mellitus) type II controlled with renal manifestation ([name])   PAD (peripheral artery disease) ([name])   ESRD on hemodialysis ([place])   Essential hypertension   GI bleed    1. Healthcare associated pneumonia -placed on empiric antibiotics.  Follow blood cultures and sputum cultures. 2. Blood loss anemia with GI bleed has had previous radiation proctitis and sigmoidoscopy done in October 2018 did show rectal ulceration.  Follow CBC and transfuse if hemoglobin less than 7.  May consult GI in the morning. 3. Sleep apnea on CPAP. 4. ESRD on hemodialysis on Tuesday Thursdays and Saturday has had dialysis yesterday. 5. Generalized weakness cause not clear.  CT head unremarkable. 6. Hypothyroidism on Tapazole follow TSH. 7. Chronic combined systolic and diastolic heart failure last EF measured in January 2018 was 40 to 45%.  Volume management  per dialysis. 8. CAD status post stenting.  We will cycle cardiac markers.  Denies any chest pain but does have fatigue. 9. History of prostate cancer status post radiation therapy.  10. History of SVT status post ablation. 11. History of gout on allopurinol. 12. History of hyperlipidemia intolerant to statins.   DVT prophylaxis: SCDs due to GI bleed. Code Status: Full code. Family Communication: Patient's wife. Disposition Plan: Home. Consults called: None. Admission status: Inpatient.   Rise Patience MD [facility] Pager [phone].  If 7PM-7AM, please contact night-coverage [email or web address] Password [facility]  06/11/2018, 3:37 AM

## 2018-06-11 NOTE — Progress Notes (Signed)
RT placed patient on home CPAP with sterile water added on 2L.

## 2018-06-11 NOTE — Consult Note (Addendum)
Subjective:   HPI  The patient is a 67 year old male with a history of end-stage renal disease who is on hemodialysis 3 times a week. Yesterday at dialysis he had bleeding from his AV fistula which required pressure for quite some time to get it stopped. The patient states he was bleeding a lot. He thinks the bleeding went on for about 20 minutes. In any event he went home after that but developed fever chills and shortness of breath. He came back to the emergency room where he was found to have a temperature of 102. We were asked to see him because his stools were heme positive and there has been a 3 g drop in hemoglobin over the last month. The patient states that his stools are chronically black in color because he takes iron. He does have a history of proctitis and an ulcer in the rectum. This area was treated with argon plasma coagulation last year. He denies hematemesis or abdominal pain or heartburn.several years ago he had a clean-based ulcer at the esophagogastric junction.     Past Medical History:  Diagnosis Date  . Allergic rhinitis, cause unspecified 02/24/2014  . Anemia 06/16/2011  . BENIGN PROSTATIC HYPERTROPHY 10/14/2009  . CAD, NATIVE VESSEL 02/06/2009   a. 06/2007 s/p Taxus DES to the RCA;  b. 08/2016 NSTEMI in setting of SVT/PCI: LM 30ost, LAD 42m (3.0x16 Synergy DES), LCX 50m, OM1 60, RI 40, RCA 70p/m, 6m - not amenable to PCI.  [name] Cervical radiculopathy, chronic 02/23/2016   Right c5-6 by NCS/EMG  . Chronic combined systolic (congestive) and diastolic (congestive) heart failure ([place])    a. 10/2016 Echo: EF 40-45%, Gr2 DD. mildly dil LA.  [name] COLONIC POLYPS, HX OF 10/14/2009  . Dementia [id number]  . Depression 09/24/2015  . DIABETES MELLITUS, TYPE II 02/01/2010  . DYSLIPIDEMIA 06/18/2007  . ESRD (end stage renal disease) on dialysis ([place]) 08/04/2010   "TTS;  " (04/18/2015)  . FOOT PAIN 08/12/2008  . GAIT DISTURBANCE 03/03/2010  . GASTROENTERITIS, VIRAL 10/14/2009  . GERD 06/18/2007  .  GOITER, MULTINODULAR 12/26/2007  . GOUT 06/18/2007  . GYNECOMASTIA 07/17/2010  . Hemodialysis access, fistula mature [facility])    Dialysis T-Th-Sa ([place]) Right upper arm fistula  . Hyperlipidemia 10/16/2011  . Hyperparathyroidism, secondary ([place]) 06/16/2011  . HYPERTENSION 06/18/2007  . Hyperthyroidism   . Hypocalcemia 06/07/2010  . Ischemic cardiomyopathy    a. 10/2016 Echo: EF 40-45%.  . Lumbar stenosis with neurogenic claudication   . ONYCHOMYCOSIS, TOENAILS 12/26/2007  . OSA on CPAP 10/16/2011  . Other malaise and fatigue 11/24/2009  . PERIPHERAL NEUROPATHY 06/18/2007  . Prostate cancer ([place])   . PSVT (paroxysmal supraventricular tachycardia) ([place])    a. 27/7412 complicated by NSTEMI;  b. 11/2016 Treated w/ adenosine in ED;  c. 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  [name] PULMONARY NODULE, RIGHT LOWER LOBE 06/08/2009  . Sleep apnea    cpap machine and o2  . TRANSAMINASES, SERUM, ELEVATED 02/01/2010  . Transfusion history    none recent  . Unspecified hypotension 01/30/2010   Past Surgical History:  Procedure Laterality Date  . A/V SHUNTOGRAM N/A 04/07/2018   Procedure: A/V SHUNTOGRAM;  Surgeon: Waynetta Sandy, MD;  Location: [place] CV LAB;  Service: Cardiovascular;  Laterality: N/A;  . ARTERIOVENOUS GRAFT PLACEMENT Right 2009   forearm/notes 02/01/2011  . AV FISTULA PLACEMENT  11/07/2011   Procedure: INSERTION OF ARTERIOVENOUS (AV) GORE-TEX GRAFT ARM;  Surgeon: Tinnie Gens, MD;  Location: [facility]  OR;  Service: Vascular;  Laterality: Left;  . BACK SURGERY  1998  . BASCILIC VEIN TRANSPOSITION Right 02/27/2013   Procedure: BASCILIC VEIN TRANSPOSITION;  Surgeon: Mal Misty, MD;  Location: [place];  Service: Vascular;  Laterality: Right;  Right Basilic Vein Transposition   . CARDIAC CATHETERIZATION N/A 08/06/2016   Procedure: Left Heart Cath and Coronary Angiography;  Surgeon: Jolaine Artist, MD;  Location: [place] CV LAB;  Service: Cardiovascular;  Laterality: N/A;  . CARDIAC  CATHETERIZATION N/A 08/07/2016   Procedure: Coronary/Graft Atherectomy-CSI LAD;  Surgeon: Peter M Martinique, MD;  Location: [place] CV LAB;  Service: Cardiovascular;  Laterality: N/A;  . CERVICAL SPINE SURGERY  2/09   "to repair nerve problems in my left arm"  . CHOLECYSTECTOMY    . COLONOSCOPY WITH PROPOFOL N/A 04/26/2017   Procedure: COLONOSCOPY WITH PROPOFOL;  Surgeon: Otis Brace, MD;  Location: [place];  Service: Gastroenterology;  Laterality: N/A;  . CORONARY ANGIOPLASTY WITH STENT PLACEMENT  06/11/2008  . CORONARY ANGIOPLASTY WITH STENT PLACEMENT  06/2007   TAXUS stent to RCA/notes 01/31/2011  . ESOPHAGOGASTRODUODENOSCOPY  09/28/2011   Procedure: ESOPHAGOGASTRODUODENOSCOPY (EGD);  Surgeon: Missy Sabins, MD;  Location: [facility] ENDOSCOPY;  Service: Endoscopy;  Laterality: N/A;  . ESOPHAGOGASTRODUODENOSCOPY N/A 04/07/2015   Procedure: ESOPHAGOGASTRODUODENOSCOPY (EGD);  Surgeon: Teena Irani, MD;  Location: [facility];  Service: Endoscopy;  Laterality: N/A;  . ESOPHAGOGASTRODUODENOSCOPY N/A 04/19/2015   Procedure: ESOPHAGOGASTRODUODENOSCOPY (EGD);  Surgeon: Arta Silence, MD;  Location: [facility] ENDOSCOPY;  Service: Endoscopy;  Laterality: N/A;  . FLEXIBLE SIGMOIDOSCOPY N/A 05/21/2017   Procedure: Conni Elliot;  Surgeon: Clarene Essex, MD;  Location: [place];  Service: Endoscopy;  Laterality: N/A;  . FLEXIBLE SIGMOIDOSCOPY Left 07/02/2017   Procedure: FLEXIBLE SIGMOIDOSCOPY;  Surgeon: [place] Spates, MD;  Location: [place];  Service: Endoscopy;  Laterality: Left;  . FOREIGN BODY REMOVAL  09/2003   via upper endoscopy/notes 02/12/2011  . GIVENS CAPSULE STUDY  09/30/2011   Procedure: GIVENS CAPSULE STUDY;  Surgeon: Jeryl Columbia, MD;  Location: [facility] ENDOSCOPY;  Service: Endoscopy;  Laterality: N/A;  . INSERTION OF DIALYSIS CATHETER Right 2014  . INSERTION OF DIALYSIS CATHETER Left 02/11/2013   Procedure: INSERTION OF DIALYSIS CATHETER;  Surgeon: Conrad Clarke, MD;  Location: [place];  Service: Vascular;  Laterality: Left;  Ultrasound guided  . LUMBAR LAMINECTOMY/DECOMPRESSION MICRODISCECTOMY Bilateral 07/31/2017   Procedure: LAMINECTOMY AND FORAMINOTOMY- BILATERAL LUMBAR TWO- LUMBAR THREE;  Surgeon: Earnie Larsson, MD;  Location: [place];  Service: Neurosurgery;  Laterality: Bilateral;  LAMINECTOMY AND FORAMINOTOMY- BILATERAL LUMBAR 2- LUMBAR 3  . PERIPHERAL VASCULAR BALLOON ANGIOPLASTY  04/07/2018   Procedure: PERIPHERAL VASCULAR BALLOON ANGIOPLASTY;  Surgeon: Waynetta Sandy, MD;  Location: [place] CV LAB;  Service: Cardiovascular;;  RUE AVF  . REMOVAL OF A DIALYSIS CATHETER Right 02/11/2013   Procedure: REMOVAL OF A DIALYSIS CATHETER;  Surgeon: Conrad [place], MD;  Location: [place];  Service: Vascular;  Laterality: Right;  . SAVORY DILATION N/A 04/07/2015   Procedure: SAVORY DILATION;  Surgeon: Teena Irani, MD;  Location: [facility] ENDOSCOPY;  Service: Endoscopy;  Laterality: N/A;  . SHUNTOGRAM N/A 09/20/2011   Procedure: Earney Mallet;  Surgeon: Conrad Neihart, MD;  Location[facility] CATH LAB;  Service: Cardiovascular;  Laterality: N/A;  . SVT ABLATION N/A 11/26/2016   Procedure: SVT Ablation;  Surgeon: Will Meredith Leeds, MD;  Location: [place] CV LAB;  Service: Cardiovascular;  Laterality: N/A;  . TONSILLECTOMY    . TOTAL KNEE ARTHROPLASTY Right  08/02/2015   Procedure: TOTAL KNEE ARTHROPLASTY;  Surgeon: Renette Butters, MD;  Location: [place];  Service: Orthopedics;  Laterality: Right;  . VENOGRAM N/A 01/26/2013   Procedure: VENOGRAM;  Surgeon: Angelia Mould, MD;  Location[facility] C[facility] LAB;  Service: Cardiovascular;  Laterality: N/A;   Social History   Socioeconomic History  . Marital status: Married    Spouse name: Not on file  . Number of children: 3  . Years of education: 39  . Highest education level: Not on file  Occupational History  . Occupation: disabled    Employer: DISABLED  . Occupation: formerly Lawyer for [employer].  Social Needs  . Financial  resource strain: Not on file  . Food insecurity:    Worry: Not on file    Inability: Not on file  . Transportation needs:    Medical: Not on file    Non-medical: Not on file  Tobacco Use  . Smoking status: Former Smoker    Packs/day: 1.00    Years: 25.00    Pack years: 25.00    Types: Cigarettes    Last attempt to quit: 10/01/2005    Years since quitting: 12.7  . Smokeless tobacco: Former Systems developer    Quit date: 10/01/2005  . Tobacco comment: Quit smoking 2007 Smoked x 25 years 1/2 ppd.  Substance and Sexual Activity  . Alcohol use: No    Alcohol/week: 0.0 standard drinks  . Drug use: No  . Sexual activity: Never  Lifestyle  . Physical activity:    Days per week: Not on file    Minutes per session: Not on file  . Stress: Not on file  Relationships  . Social connections:    Talks on phone: Not on file    Gets together: Not on file    Attends religious service: Not on file    Active member of club or organization: Not on file    Attends meetings of clubs or organizations: Not on file    Relationship status: Not on file  . Intimate partner violence:    Fear of current or ex partner: Not on file    Emotionally abused: Not on file    Physically abused: Not on file    Forced sexual activity: Not on file  Other Topics Concern  . Not on file  Social History Narrative   Lives with wife   Caffeine use: Tea sometimes   No coffee   Left handed    family history includes Diabetes in his father; Healthy in his child, child, and child; Heart disease in his father and sister; Hypertension in his father; Kidney failure in his father; Thyroid nodules in his sister.  Current Facility-Administered Medications:  .  acetaminophen (TYLENOL) tablet 650 mg, 650 mg, Oral, Q6H PRN **OR** acetaminophen (TYLENOL) suppository 650 mg, 650 mg, Rectal, Q6H PRN, Rise Patience, MD .  famotidine (PEPCID) IVPB 20 mg premix, 20 mg, Intravenous, Q24H, Rumbarger, Valeda Malm, RPH, Last Rate: 100 mL/hr at  06/11/18 0948, 20 mg at 06/11/18 0948 .  ondansetron (ZOFRAN) tablet 4 mg, 4 mg, Oral, Q6H PRN **OR** ondansetron (ZOFRAN) injection 4 mg, 4 mg, Intravenous, Q6H PRN, Rise Patience, MD .  Derrill Memo ON 06/12/2018] piperacillin-tazobactam (ZOSYN) IVPB 3.375 g, 3.375 g, Intravenous, Q12H, Lacretia Leigh, MD .  Derrill Memo ON 06/12/2018] vancomycin (VANCOCIN) IVPB 1000 mg/200 mL premix, 1,000 mg, Intravenous, Q T,Th,Sa-HD, Rumbarger, Valeda Malm, RPH  Current Outpatient Medications:  .  acetaminophen (TYLENOL) 325 MG tablet, Take  650 mg by mouth every 6 (six) hours as needed for mild pain or headache. , Disp: , Rfl:  .  Alirocumab (PRALUENT) 150 MG/ML SOPN, Inject 150 mg into the skin every 14 (fourteen) days., Disp: 2 pen, Rfl: 12 .  allopurinol (ZYLOPRIM) 100 MG tablet, TAKE ONE TABLET BY MOUTH ONCE DAILY, Disp: 90 tablet, Rfl: 3 .  aspirin EC 81 MG tablet, Take 81 mg by mouth daily., Disp: , Rfl:  .  betamethasone dipropionate (DIPROLENE) 0.05 % cream, Apply 1 application topically daily as needed (leg sores)., Disp: , Rfl:  .  citalopram (CELEXA) 10 MG tablet, Take 1 tablet (10 mg total) by mouth at bedtime., Disp: 90 tablet, Rfl: 3 .  ezetimibe (ZETIA) 10 MG tablet, TAKE ONE TABLET BY MOUTH ONCE DAILY, Disp: 90 tablet, Rfl: 3 .  ferric citrate (AURYXIA) 1 GM 210 MG(Fe) tablet, Take 210-420 mg by mouth See admin instructions. Take 420 mg by mouth two times a day and 210 mg with each snack, Disp: , Rfl:  .  gabapentin (NEURONTIN) 300 MG capsule, Take 1 capsule (300 mg total) by mouth at bedtime. (Patient taking differently: Take 600 mg by mouth at bedtime. ), Disp: , Rfl:  .  hydroxypropyl methylcellulose / hypromellose (ISOPTO TEARS / GONIOVISC) 2.5 % ophthalmic solution, Place 1 drop into both eyes 3 (three) times daily as needed for dry eyes., Disp: 15 mL, Rfl: 11 .  methimazole (TAPAZOLE) 5 MG tablet, Take 0.5 tablets (2.5 mg total) by mouth daily., Disp: 45 tablet, Rfl: 3 .  midodrine (PROAMATINE) 10  MG tablet, Take 1 tablet (10 mg total) by mouth See admin instructions. Once a day only on dialysis days (Tues/Thurs/Sat) (Patient taking differently: Take 10 mg by mouth See admin instructions. Take 10 mg by mouth prior to dialysis only on Tues/Thurs/Sat), Disp: 30 tablet, Rfl: 3 .  multivitamin (RENA-VIT) TABS tablet, Take 1 tablet by mouth daily. Reported on 10/24/2015, Disp: , Rfl:  .  pantoprazole (PROTONIX) 40 MG tablet, Take 40 mg by mouth every evening. , Disp: , Rfl:  .  polyethylene glycol (MIRALAX / GLYCOLAX) packet, Take 17 g by mouth daily. (Patient taking differently: Take 17 g by mouth daily as needed for mild constipation. ), Disp: 14 each, Rfl: 0 .  SENSIPAR 90 MG tablet, Take 90 mg by mouth 2 (two) times daily., Disp: , Rfl:  .  WELCHOL 625 MG tablet, TAKE THREE TABLETS BY MOUTH TWICE DAILY WITH MEALS, Disp: 180 tablet, Rfl: 11 .  zolpidem (AMBIEN) 5 MG tablet, TAKE 1 TABLET BY MOUTH AT BEDTIME AS NEEDED FOR SLEEP (Patient taking differently: Take 5 mg by mouth at bedtime. ), Disp: 90 tablet, Rfl: 1 Allergies  Allergen Reactions  . Cephalexin Swelling and Other (See Comments)    Tongue swelling, but no breathing issues  . Statins Other (See Comments)    Weak muscles  . Ciprofloxacin Rash     Objective:     BP (!) 106/54   Pulse 94   Temp 99.9 F (37.7 C) (Oral)   Resp (!) 22   SpO2 93%   No acute distress  Heart regular rhythm no murmurs  Lungs clear  Abdomen soft nontender and no hepatosplenomegaly  Laboratory No components found for: D1    Assessment:    pneumonia  Multiple other medical problems as noted  Anemia with 3 g drop in blood count in the past month  Chronic black stools secondary to iron  Heme positive stool  Radiation proctitis         Plan:     I don't think this patient is actively bleeding significantly from the GI tract currently. He states he did loose a lot of blood yesterday with the bleeding of the fistula. This could  account for some drop in blood count. His heme positive stool is probably secondary to the  Radiation proctitis and history of rectal ulcer. At this point I would treat his pneumonia, transfuse blood as needed, follow him clinically. Watch for further trend in hemoglobin and hematocrit. Doubt at this time that he needs EGD or colonoscopy emergently. Follow clinically. Lab Results  Component Value Date   HGB 7.9 (L) 06/11/2018   HGB 8.6 (L) 06/10/2018   HGB 11.8 (L) 05/14/2018   HGB 11.7 (L) 11/20/2016   HCT 25.4 (L) 06/11/2018   HCT 27.5 (L) 06/10/2018   HCT 36.6 (L) 05/14/2018   HCT 35.6 (L) 11/20/2016   ALKPHOS 220 (H) 06/11/2018   ALKPHOS 243 (H) 06/10/2018   ALKPHOS 271 (H) 05/14/2018   AST 24 06/11/2018   AST 32 06/10/2018   AST 39 (H) 05/14/2018   ALT 61 (H) 06/11/2018   ALT 72 (H) 06/10/2018   ALT 78 (H) 05/14/2018   AMYLASE 60 10/31/2015   AMYLASE 37 12/28/2009

## 2018-06-11 NOTE — Progress Notes (Signed)
PROGRESS NOTE                                                                                                                                                                                                             Patient Demographics:    [name], is a 67 y.o. male, DOB - 14-Aug-1951, RDE:[id number]  Admit date - 06/10/2018   Admitting Physician Rise Patience, MD  Outpatient Primary MD for the patient is Biagio Borg, MD  LOS - 0  Outpatient Specialists: renal/ [facility]  Chief Complaint  Patient presents with  . Shortness of Breath       Brief Narrative   67 year old male with history of ESRD on hemodialysis (T, T, SAT), CAD status post stenting, AVNRT status post ablation, chronic combined systolic and diastolic CHF (EF of 63-14% in 2018), chronic anemia, gout, hyperthyroidism presented to the ED with fever and chills since then night prior to admission.  Patient had visual hemodialysis following which she had small amount of bleeding from the AV fistula site which was controlled with pressure.  After returning home he started having fevers with chills and increasing shortness of breath. In the ED he was febrile with temperature of 102 F with chest x-ray showing infiltrates.  Blood cultures ordered and patient placed on empiric antibiotics.  He was also found to have drop in about 3 g of hemoglobin from last month with positive stool for Hemoccult.  He has history of GI bleed from radiation proctitis and had sigmoidoscopy in October 2018 showing ulceration in the rectum.  Patient denied any bright red blood per rectum or melena at this time.    Subjective:   Patient seen in the ED.  Reports feeling tired.  Dyspnea better this morning.   Assessment  & Plan :    Principal Problem:   HCAP (healthcare-associated pneumonia) Continue empiric vancomycin and Zosyn.  Follow blood cultures and sputum  cultures.  Active Problems:   Anemia associated with acute blood loss Noted for 3 g drop in his H&H from baseline.  Type and screen ordered.  Recently received ESA.  Monitor H&H closely.  History of radiation proctitis, sigmoidoscopy done 1 year back showing rectal ulceration.  Seen by [facility] and recommends to monitor H&H and hold off on colonoscopy.  They feel he is likely  not having active bleeding.  Continue iron supplement.     Ischemic cardiomyopathy Euvolemic.  Volume management with dialysis.  Intolerant to statin.     Hyperthyroidism Resume Tapazole    Malignant neoplasm of prostate ([place]) Status post radiation    DM (diabetes mellitus) type II controlled with renal manifestation ([place]) Not on any medications at home.  Monitor on sliding scale coverage    PAD (peripheral artery disease) ([place])    ESRD on hemodialysis ([place]) Renal following.  Schedule hemodialysis while in the hospital.  Continue phosphate binders         Code Status : Full code  Family Communication  : None at bedside  Disposition Plan  : Home possibly in the next 24-48 hours if improved  Barriers For Discharge : Active symptoms  Consults  : [facility], nephrology  Procedures  : None  DVT Prophylaxis  : SCDs  Lab Results  Component Value Date   PLT 278 06/11/2018    Antibiotics  :    Anti-infectives (From admission, onward)   Start     Dose/Rate Route Frequency Ordered Stop   06/12/18 1200  vancomycin (VANCOCIN) IVPB 1000 mg/200 mL premix     1,000 mg 200 mL/hr over 60 Minutes Intravenous Every T-Th-Sa (Hemodialysis) 06/11/18 0718     06/12/18 1000  piperacillin-tazobactam (ZOSYN) IVPB 3.375 g     3.375 g 12.5 mL/hr over 240 Minutes Intravenous Every 12 hours 06/11/18 0020     06/11/18 0045  vancomycin (VANCOCIN) 2,000 mg in sodium chloride 0.9 % 500 mL IVPB     2,000 mg 250 mL/hr over 120 Minutes Intravenous  Once 06/11/18 0004 06/11/18 0329   06/11/18 0015  piperacillin-tazobactam  (ZOSYN) IVPB 3.375 g     3.375 g 100 mL/hr over 30 Minutes Intravenous  Once 06/11/18 0000 06/11/18 0125        Objective:   Vitals:   06/11/18 0800 06/11/18 0900 06/11/18 0930 06/11/18 1256  BP: 119/72 (!) 152/75 (!) 106/54 118/61  Pulse: 90 98 94 96  Resp: 19 15 (!) 22   Temp:    100 F (37.8 C)  TempSrc:    Oral  SpO2: 96% 94% 93% (!) 89%    Wt Readings from Last 3 Encounters:  06/09/18 103 kg  06/04/18 101.7 kg  05/16/18 102.1 kg     Intake/Output Summary (Last 24 hours) at 06/11/2018 1446 Last data filed at 06/11/2018 1204 Gross per 24 hour  Intake 300 ml  Output -  Net 300 ml     Physical Exam  Gen: not in distress HEENT: Pallor present, moist mucosa, supple neck Chest: clear b/l, no added sounds CVS: N S1&S2, no murmurs,  GI: soft, NT, ND, BS+ Musculoskeletal: warm, no edema , No bleeding from AV fistula site     Data Review:    CBC Recent Labs  Lab 06/10/18 2232 06/11/18 0403  WBC 11.2* 10.8*  HGB 8.6* 7.9*  HCT 27.5* 25.4*  PLT 266 278  MCV 99.6 100.4*  MCH 31.2 31.2  MCHC 31.3 31.1  RDW 17.0* 17.0*  LYMPHSABS 0.9  --   MONOABS 1.7*  --   EOSABS 0.1  --   BASOSABS 0.0  --     Chemistries  Recent Labs  Lab 06/10/18 2232 06/11/18 0403  NA 138 139  K 4.6 4.4  CL 100 101  CO2 25 25  GLUCOSE 148* 150*  BUN 25* 31*  CREATININE 7.22* 7.88*  CALCIUM 7.7* 7.7*  AST 32 24  ALT 72* 61*  ALKPHOS 243* 220*  BILITOT 0.8 0.9   ------------------------------------------------------------------------------------------------------------------ No results for input(s): CHOL, HDL, LDLCALC, TRIG, CHOLHDL, LDLDIRECT in the last 72 hours.  Lab Results  Component Value Date   HGBA1C 6.3 (A) 05/16/2018   ------------------------------------------------------------------------------------------------------------------ No results for input(s): TSH, T4TOTAL, T3FREE, THYROIDAB in the last 72 hours.  Invalid input(s):  FREET3 ------------------------------------------------------------------------------------------------------------------ No results for input(s): VITAMINB12, FOLATE, FERRITIN, TIBC, IRON, RETICCTPCT in the last 72 hours.  Coagulation profile Recent Labs  Lab 06/10/18 2232  INR 1.13    No results for input(s): DDIMER in the last 72 hours.  Cardiac Enzymes Recent Labs  Lab 06/11/18 0403  TROPONINI 0.04*   ------------------------------------------------------------------------------------------------------------------    Component Value Date/Time   BNP 54.9 08/09/2015 0045    Inpatient Medications  Scheduled Meds: . [START ON 06/12/2018] Chlorhexidine Gluconate Cloth  6 each Topical Q0600  . cinacalcet  90 mg Oral Q supper  . [START ON 06/12/2018] doxercalciferol  4 mcg Intravenous Q T,Th,Sa-HD  . ferric citrate  420 mg Oral TID WC   Continuous Infusions: . famotidine (PEPCID) IV Stopped (06/11/18 1204)  . [START ON 06/12/2018] piperacillin-tazobactam (ZOSYN)  IV    . [START ON 06/12/2018] vancomycin     PRN Meds:.acetaminophen **OR** acetaminophen, ondansetron **OR** ondansetron (ZOFRAN) IV  Micro Results No results found for this or any previous visit (from the past 240 hour(s)).  Radiology Reports Dg Chest 2 View  Result Date: 06/10/2018 CLINICAL DATA:  Shortness of breath. EXAM: CHEST - 2 VIEW COMPARISON:  Radiograph 01/15/2018 FINDINGS: Patchy and confluent consolidation in the right lower lobe, new from prior exam. Upper normal heart size normal mediastinal contours. Aortic atherosclerosis. No pulmonary edema, pleural effusion or pneumothorax. Vascular stent in the right axilla. Degenerative change of the shoulders. IMPRESSION: Patchy and confluent consolidation in the right lower lobe, suspicious for pneumonia. Followup PA and lateral chest X-ray is recommended in 3-4 weeks following trial of antibiotic therapy to ensure resolution and exclude underlying malignancy.  Electronically Signed   By: Keith Rake M.D.   On: 06/10/2018 23:17   Ct Head Wo Contrast  Result Date: 06/11/2018 CLINICAL DATA:  Altered mental status. Fall getting up from recliner. EXAM: CT HEAD WITHOUT CONTRAST TECHNIQUE: Contiguous axial images were obtained from the base of the skull through the vertex without intravenous contrast. COMPARISON:  Head CT 10/16/2016 FINDINGS: Brain: Age related atrophy. Mild chronic small vessel ischemia. No intracranial hemorrhage, mass effect, or midline shift. No hydrocephalus. The basilar cisterns are patent. No evidence of territorial infarct or acute ischemia. No extra-axial or intracranial fluid collection. Vascular: Atherosclerosis of skullbase vasculature without hyperdense vessel or abnormal calcification. Skull: No fracture or focal lesion. Sinuses/Orbits: Paranasal sinuses and mastoid air cells are clear. The visualized orbits are unremarkable. Other: None. IMPRESSION: No acute intracranial abnormality. Electronically Signed   By: Keith Rake M.D.   On: 06/11/2018 01:31    Time Spent in minutes  20   Kanon Novosel M.D on 06/11/2018 at 2:46 PM  Between 7am to 7pm - Pager - [phone]  After 7pm go to [email or web address] - password [facility]  [facility] -  Office  [phone]

## 2018-06-11 NOTE — ED Notes (Signed)
Pt placed in hospital bed for comfort.

## 2018-06-11 NOTE — Progress Notes (Signed)
Staff ambulated patient to bathroom, told to pull cord when finished. Wife standing at the door asking a passing RN down the hall for help because husband on the floor. Upon entering room patient found sitting on the buttocks, right at the bathroom door with legs extended. Stated "did not fall", that wife helped him onto floor. Wife reported patient leaning on the walker while she was cleaning his bottoms; legs became weak, could no longer stand, and helped him to the floor. Both patient and wife repeatedly stated he did not fall. Patient denies pain; no obvious injury noted. Vitals signs stable. Lamar Blinks, NP night coverage person paged and notified. Krissia Schreier Ladora Daniel, BSN, RN

## 2018-06-11 NOTE — Consult Note (Addendum)
[facility] Renal Consultation Note    Indication for Consultation:  Management of ESRD/hemodialysis, anemia, hypertension/volume, and secondary hyperparathyroidism. PCP:  HPI: [name] is a 67 y.o. male with ESRD, HTN, CAD (s/p stents), HFrEF (40-45%), Type 2 DM, Hx SVT (s/p ablation) who was admitted with HCAP.  Pt reports that went to dialysis as usual yesterday without significant issues except mildly longer post-HD AVF bleeding. Afterwards, he felt extremely fatigued and he developed chills and fever later last night. Denied CP, dyspnea, cough. He presented to ED overnight where he was found to have temp of 102F and CXR concerning for RLL pneumonia. Labs showed K 4.6, WBC 11.2, Hgb 8.6. He was started on Vanc/Zosyn. At this time, he remains very weak but otherwise feels ok.  No cough or SOB, or sputum.   Dialyzes on TTS schedule at [facility]. Last HD 9/10 which he completed in entirety.  Past Medical History:  Diagnosis Date  . Allergic rhinitis, cause unspecified 02/24/2014  . Anemia 06/16/2011  . BENIGN PROSTATIC HYPERTROPHY 10/14/2009  . CAD, NATIVE VESSEL 02/06/2009   a. 06/2007 s/p Taxus DES to the RCA;  b. 08/2016 NSTEMI in setting of SVT/PCI: LM 30ost, LAD 63m (3.0x16 Synergy DES), LCX 82m, OM1 60, RI 40, RCA 70p/m, 54m - not amenable to PCI.  [name] Cervical radiculopathy, chronic 02/23/2016   Right c5-6 by NCS/EMG  . Chronic combined systolic (congestive) and diastolic (congestive) heart failure ([place])    a. 10/2016 Echo: EF 40-45%, Gr2 DD. mildly dil LA.  [name] COLONIC POLYPS, HX OF 10/14/2009  . Dementia [id number]  . Depression 09/24/2015  . DIABETES MELLITUS, TYPE II 02/01/2010  . DYSLIPIDEMIA 06/18/2007  . ESRD (end stage renal disease) on dialysis ([place]) 08/04/2010   "TTS;  " (04/18/2015)  . FOOT PAIN 08/12/2008  . GAIT DISTURBANCE 03/03/2010  . GASTROENTERITIS, VIRAL 10/14/2009  . GERD 06/18/2007  . GOITER, MULTINODULAR 12/26/2007  . GOUT 06/18/2007  . GYNECOMASTIA  07/17/2010  . Hemodialysis access, fistula mature [facility])    Dialysis T-Th-Sa ([place]) Right upper arm fistula  . Hyperlipidemia 10/16/2011  . Hyperparathyroidism, secondary ([place]) 06/16/2011  . HYPERTENSION 06/18/2007  . Hyperthyroidism   . Hypocalcemia 06/07/2010  . Ischemic cardiomyopathy    a. 10/2016 Echo: EF 40-45%.  . Lumbar stenosis with neurogenic claudication   . ONYCHOMYCOSIS, TOENAILS 12/26/2007  . OSA on CPAP 10/16/2011  . Other malaise and fatigue 11/24/2009  . PERIPHERAL NEUROPATHY 06/18/2007  . Prostate cancer ([place])   . PSVT (paroxysmal supraventricular tachycardia) ([place])    a. [id number]/2671 complicated by NSTEMI;  b. 11/2016 Treated w/ adenosine in ED;  c. 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  [name] PULMONARY NODULE, RIGHT LOWER LOBE 06/08/2009  . Sleep apnea    cpap machine and o2  . TRANSAMINASES, SERUM, ELEVATED 02/01/2010  . Transfusion history    none recent  . Unspecified hypotension 01/30/2010   Past Surgical History:  Procedure Laterality Date  . A/V SHUNTOGRAM N/A 04/07/2018   Procedure: A/V SHUNTOGRAM;  Surgeon: Waynetta Sandy, MD;  Location: [place] CV LAB;  Service: Cardiovascular;  Laterality: N/A;  . ARTERIOVENOUS GRAFT PLACEMENT Right 2009   forearm/notes 02/01/2011  . AV FISTULA PLACEMENT  11/07/2011   Procedure: INSERTION OF ARTERIOVENOUS (AV) GORE-TEX GRAFT ARM;  Surgeon: Tinnie Gens, MD;  Location: [place];  Service: Vascular;  Laterality: Left;  . BACK SURGERY  1998  . BASCILIC VEIN TRANSPOSITION Right 02/27/2013   Procedure: BASCILIC VEIN TRANSPOSITION;  Surgeon: Mal Misty, MD;  Location: [place];  Service: Vascular;  Laterality: Right;  Right Basilic Vein Transposition   . CARDIAC CATHETERIZATION N/A 08/06/2016   Procedure: Left Heart Cath and Coronary Angiography;  Surgeon: Jolaine Artist, MD;  Location: [place] CV LAB;  Service: Cardiovascular;  Laterality: N/A;  . CARDIAC CATHETERIZATION N/A 08/07/2016   Procedure: Coronary/Graft  Atherectomy-CSI LAD;  Surgeon: Peter M Martinique, MD;  Location: [place] CV LAB;  Service: Cardiovascular;  Laterality: N/A;  . CERVICAL SPINE SURGERY  2/09   "to repair nerve problems in my left arm"  . CHOLECYSTECTOMY    . COLONOSCOPY WITH PROPOFOL N/A 04/26/2017   Procedure: COLONOSCOPY WITH PROPOFOL;  Surgeon: Otis Brace, MD;  Location: [place];  Service: Gastroenterology;  Laterality: N/A;  . CORONARY ANGIOPLASTY WITH STENT PLACEMENT  06/11/2008  . CORONARY ANGIOPLASTY WITH STENT PLACEMENT  06/2007   TAXUS stent to RCA/notes 01/31/2011  . ESOPHAGOGASTRODUODENOSCOPY  09/28/2011   Procedure: ESOPHAGOGASTRODUODENOSCOPY (EGD);  Surgeon: Missy Sabins, MD;  Location: [facility] ENDOSCOPY;  Service: Endoscopy;  Laterality: N/A;  . ESOPHAGOGASTRODUODENOSCOPY N/A 04/07/2015   Procedure: ESOPHAGOGASTRODUODENOSCOPY (EGD);  Surgeon: Teena Irani, MD;  Location: [facility];  Service: Endoscopy;  Laterality: N/A;  . ESOPHAGOGASTRODUODENOSCOPY N/A 04/19/2015   Procedure: ESOPHAGOGASTRODUODENOSCOPY (EGD);  Surgeon: Arta Silence, MD;  Location: [facility] ENDOSCOPY;  Service: Endoscopy;  Laterality: N/A;  . FLEXIBLE SIGMOIDOSCOPY N/A 05/21/2017   Procedure: Conni Elliot;  Surgeon: Clarene Essex, MD;  Location: [place];  Service: Endoscopy;  Laterality: N/A;  . FLEXIBLE SIGMOIDOSCOPY Left 07/02/2017   Procedure: FLEXIBLE SIGMOIDOSCOPY;  Surgeon: Laurence Spates, MD;  Location: [place];  Service: Endoscopy;  Laterality: Left;  . FOREIGN BODY REMOVAL  09/2003   via upper endoscopy/notes 02/12/2011  . GIVENS CAPSULE STUDY  09/30/2011   Procedure: GIVENS CAPSULE STUDY;  Surgeon: Jeryl Columbia, MD;  Location: [facility] ENDOSCOPY;  Service: Endoscopy;  Laterality: N/A;  . INSERTION OF DIALYSIS CATHETER Right 2014  . INSERTION OF DIALYSIS CATHETER Left 02/11/2013   Procedure: INSERTION OF DIALYSIS CATHETER;  Surgeon: Conrad Reinholds, MD;  Location: [place];  Service: Vascular;  Laterality: Left;  Ultrasound guided   . LUMBAR LAMINECTOMY/DECOMPRESSION MICRODISCECTOMY Bilateral 07/31/2017   Procedure: LAMINECTOMY AND FORAMINOTOMY- BILATERAL LUMBAR TWO- LUMBAR THREE;  Surgeon: Earnie Larsson, MD;  Location: [place];  Service: Neurosurgery;  Laterality: Bilateral;  LAMINECTOMY AND FORAMINOTOMY- BILATERAL LUMBAR 2- LUMBAR 3  . PERIPHERAL VASCULAR BALLOON ANGIOPLASTY  04/07/2018   Procedure: PERIPHERAL VASCULAR BALLOON ANGIOPLASTY;  Surgeon: Waynetta Sandy, MD;  Location: [place] CV LAB;  Service: Cardiovascular;;  RUE AVF  . REMOVAL OF A DIALYSIS CATHETER Right 02/11/2013   Procedure: REMOVAL OF A DIALYSIS CATHETER;  Surgeon: Conrad Ty Ty, MD;  Location: [place];  Service: Vascular;  Laterality: Right;  . SAVORY DILATION N/A 04/07/2015   Procedure: SAVORY DILATION;  Surgeon: Teena Irani, MD;  Location: [facility] ENDOSCOPY;  Service: Endoscopy;  Laterality: N/A;  . SHUNTOGRAM N/A 09/20/2011   Procedure: Earney Mallet;  Surgeon: Conrad Andrews, MD;  Location: [facility] CATH LAB;  Service: Cardiovascular;  Laterality: N/A;  . SVT ABLATION N/A 11/26/2016   Procedure: SVT Ablation;  Surgeon: Will Meredith Leeds, MD;  Location: [place] CV LAB;  Service: Cardiovascular;  Laterality: N/A;  . TONSILLECTOMY    . TOTAL KNEE ARTHROPLASTY Right 08/02/2015   Procedure: TOTAL KNEE ARTHROPLASTY;  Surgeon: Renette Butters, MD;  Location: [place];  Service: Orthopedics;  Laterality: Right;  . VENOGRAM N/A  01/26/2013   Procedure: VENOGRAM;  Surgeon: Angelia Mould, MD;  Location: [facility] CATH LAB;  Service: Cardiovascular;  Laterality: N/A;   Family History  Problem Relation Age of Onset  . Heart disease Sister   . Thyroid nodules Sister   . Heart disease Father   . Diabetes Father   . Kidney failure Father   . Hypertension Father   . Healthy Child   . Healthy Child   . Healthy Child   . Cancer Neg Hx    Social History:  reports that he quit smoking about 12 years ago. His smoking use included cigarettes. He has a 25.00 pack-year  smoking history. He quit smokeless tobacco use about 12 years ago. He reports that he does not drink alcohol or use drugs.  ROS: As per HPI otherwise negative.  Physical Exam: Vitals:   06/11/18 0800 06/11/18 0900 06/11/18 0930 06/11/18 1256  BP: 119/72 (!) 152/75 (!) 106/54 118/61  Pulse: 90 98 94 96  Resp: 19 15 (!) 22   Temp:    100 F (37.8 C)  TempSrc:    Oral  SpO2: 96% 94% 93% (!) 89%     General: Well developed, well nourished, in no acute distress. Head: Normocephalic, atraumatic, sclera non-icteric, mucus membranes are moist.Scarring both retinas Neck: Supple PC lymphadenopathy/masses. Lungs: Clear bilaterally to auscultation without wheezes, rales, or rhonchi. Breathing is unlabored. Heart: RRR with normal S1, S2. Gr2/6 SEM, rubs, or gallops appreciated. Abdomen: Soft, non-tender, non-distended with normoactive bowel sounds. No rebound/guarding. Liver down 4 cm Musculoskeletal:  Strength and tone appear normal for age. Lower extremities: No edema or ischemic changes, no open wounds. Neuro: Alert and oriented X 3. Moves all extremities spontaneously. Psych:  Responds to questions appropriately with a normal affect. Dialysis Access: R AVF + bruit/thrill R arm esp RUA much warmer than L  Allergies  Allergen Reactions  . Cephalexin Swelling and Other (See Comments)    Tongue swelling, but no breathing issues  . Statins Other (See Comments)    Weak muscles  . Ciprofloxacin Rash   Prior to Admission medications   Medication Sig Start Date End Date Taking? Authorizing Provider  acetaminophen (TYLENOL) 325 MG tablet Take 650 mg by mouth every 6 (six) hours as needed for mild pain or headache.    Yes [provider]  Alirocumab (PRALUENT) 150 MG/ML SOPN Inject 150 mg into the skin every 14 (fourteen) days. 03/05/18  Yes Lorretta Harp, MD  allopurinol (ZYLOPRIM) 100 MG tablet TAKE ONE TABLET BY MOUTH ONCE DAILY 10/14/17  Yes Biagio Borg, MD  aspirin EC 81 MG  tablet Take 81 mg by mouth daily.   Yes [provider]  betamethasone dipropionate (DIPROLENE) 0.05 % cream Apply 1 application topically daily as needed (leg sores).   Yes [provider]  citalopram (CELEXA) 10 MG tablet Take 1 tablet (10 mg total) by mouth at bedtime. 10/14/17  Yes Biagio Borg, MD  ezetimibe (ZETIA) 10 MG tablet TAKE ONE TABLET BY MOUTH ONCE DAILY 04/08/17  Yes Bensimhon, Shaune Pascal, MD  ferric citrate (AURYXIA) 1 GM 210 MG(Fe) tablet Take 210-420 mg by mouth See admin instructions. Take 420 mg by mouth two times a day and 210 mg with each snack   Yes [provider]  gabapentin (NEURONTIN) 300 MG capsule Take 1 capsule (300 mg total) by mouth at bedtime. Patient taking differently: Take 600 mg by mouth at bedtime.  07/05/17  Yes Domenic Polite, MD  hydroxypropyl methylcellulose / hypromellose (ISOPTO TEARS / GONIOVISC) 2.5 % ophthalmic solution Place 1 drop into both eyes 3 (three) times daily as needed for dry eyes. 05/03/17  Yes Biagio Borg, MD  methimazole (TAPAZOLE) 5 MG tablet Take 0.5 tablets (2.5 mg total) by mouth daily. 05/16/18  Yes Renato Shin, MD  midodrine (PROAMATINE) 10 MG tablet Take 1 tablet (10 mg total) by mouth See admin instructions. Once a day only on dialysis days (Tues/Thurs/Sat) Patient taking differently: Take 10 mg by mouth See admin instructions. Take 10 mg by mouth prior to dialysis only on Tues/Thurs/Sat 02/25/18  Yes Bensimhon, Shaune Pascal, MD  multivitamin (RENA-VIT) TABS tablet Take 1 tablet by mouth daily. Reported on 10/24/2015   Yes [provider]  pantoprazole (PROTONIX) 40 MG tablet Take 40 mg by mouth every evening.  08/31/16  Yes [provider]  polyethylene glycol (MIRALAX / GLYCOLAX) packet Take 17 g by mouth daily. Patient taking differently: Take 17 g by mouth daily as needed for mild constipation.  05/23/17  Yes Florencia Reasons, MD  SENSIPAR 90 MG tablet Take 90 mg by mouth 2 (two) times daily. 09/18/16   Yes [provider]  WELCHOL 625 MG tablet TAKE THREE TABLETS BY MOUTH TWICE DAILY WITH MEALS 10/04/17  Yes Biagio Borg, MD  zolpidem (AMBIEN) 5 MG tablet TAKE 1 TABLET BY MOUTH AT BEDTIME AS NEEDED FOR SLEEP Patient taking differently: Take 5 mg by mouth at bedtime.  03/27/18  Yes Biagio Borg, MD   Current Facility-Administered Medications  Medication Dose Route Frequency Provider Last Rate Last Dose  . acetaminophen (TYLENOL) tablet 650 mg  650 mg Oral Q6H PRN Rise Patience, MD       Or  . acetaminophen (TYLENOL) suppository 650 mg  650 mg Rectal Q6H PRN Rise Patience, MD      . famotidine (PEPCID) IVPB 20 mg premix  20 mg Intravenous Q24H Rumbarger, Valeda Malm, RPH   Stopped at 06/11/18 1204  . ondansetron (ZOFRAN) tablet 4 mg  4 mg Oral Q6H PRN Rise Patience, MD       Or  . ondansetron [facility]) injection 4 mg  4 mg Intravenous Q6H PRN Rise Patience, MD      . Derrill Memo ON 06/12/2018] piperacillin-tazobactam (ZOSYN) IVPB 3.375 g  3.375 g Intravenous Q12H Lacretia Leigh, MD      . Derrill Memo ON 06/12/2018] vancomycin (VANCOCIN) IVPB 1000 mg/200 mL premix  1,000 mg Intravenous Q T,Th,Sa-HD Para [facility], [facility]       Labs: Basic Metabolic Panel: Recent Labs  Lab 06/10/18 2232 06/11/18 0403  NA 138 139  K 4.6 4.4  CL 100 101  CO2 25 25  GLUCOSE 148* 150*  BUN 25* 31*  CREATININE 7.22* 7.88*  CALCIUM 7.7* 7.7*   Liver Function Tests: Recent Labs  Lab 06/10/18 2232 06/11/18 0403  AST 32 24  ALT 72* 61*  ALKPHOS 243* 220*  BILITOT 0.8 0.9  PROT 6.9 6.4*  ALBUMIN 2.9* 2.6*   CBC: Recent Labs  Lab 06/10/18 2232 06/11/18 0403  WBC 11.2* 10.8*  NEUTROABS 8.5*  --   HGB 8.6* 7.9*  HCT 27.5* 25.4*  MCV 99.6 100.4*  PLT 266 278   Cardiac Enzymes: Recent Labs  Lab 06/11/18 0403  TROPONINI 0.04*   CBG: Recent Labs  Lab 06/11/18 1318  GLUCAP 192*   Studies/Results: Dg Chest 2 View  Result Date: 06/10/2018 CLINICAL DATA:   Shortness of breath. EXAM:  CHEST - 2 VIEW COMPARISON:  Radiograph 01/15/2018 FINDINGS: Patchy and confluent consolidation in the right lower lobe, new from prior exam. Upper normal heart size normal mediastinal contours. Aortic atherosclerosis. No pulmonary edema, pleural effusion or pneumothorax. Vascular stent in the right axilla. Degenerative change of the shoulders. IMPRESSION: Patchy and confluent consolidation in the right lower lobe, suspicious for pneumonia. Followup PA and lateral chest X-ray is recommended in 3-4 weeks following trial of antibiotic therapy to ensure resolution and exclude underlying malignancy. Electronically Signed   By: Keith Rake M.D.   On: 06/10/2018 23:17   Ct Head Wo Contrast  Result Date: 06/11/2018 CLINICAL DATA:  Altered mental status. Fall getting up from recliner. EXAM: CT HEAD WITHOUT CONTRAST TECHNIQUE: Contiguous axial images were obtained from the base of the skull through the vertex without intravenous contrast. COMPARISON:  Head CT 10/16/2016 FINDINGS: Brain: Age related atrophy. Mild chronic small vessel ischemia. No intracranial hemorrhage, mass effect, or midline shift. No hydrocephalus. The basilar cisterns are patent. No evidence of territorial infarct or acute ischemia. No extra-axial or intracranial fluid collection. Vascular: Atherosclerosis of skullbase vasculature without hyperdense vessel or abnormal calcification. Skull: No fracture or focal lesion. Sinuses/Orbits: Paranasal sinuses and mastoid air cells are clear. The visualized orbits are unremarkable. Other: None. IMPRESSION: No acute intracranial abnormality. Electronically Signed   By: Keith Rake M.D.   On: 06/11/2018 01:31   Dialysis Orders:  TTS at [facility] 4hr, BFR 350/800, EDW 102kg, 2K/2Ca, UF#2/lin, AVF, no heparin - Mircera 79mcg IV q 2 weeks (last 9/10, Hgb 9.2 on 9/5, tsat 46% 8/22) - Hectoral 67mcg IV q HD  Assessment/Plan: 1.  RLL HCAP: On Vanc/Zosyn. Per primary.Not  clearly present on CXR and by exam/Hx ? Role of R arm warmth. 2.  ESRD: Continue HD per TTS schedule, next 9/12. No heparin. AVF looks good - running well as outpatient with good flows, unclear what caused prolonged bleeding 9/10, monitor. 3.  Hypertension/volume: BP good, no edema on exam. UF as tolerated. 4.  Anemia: Hgb 7.9 - just given ESA dose on 9/10. Follow. Cannot attribute his drop in Hb, to post HD bleeding. Suspect other source 5.  Metabolic bone disease: Corr Ca ok. Phos pending. Continue home binders/VDRA. 6.  Nutrition: Alb low, start Pro-stat supps. 7.  T2DM 8.  CAD, ischemic CM 9. Obesity 10. CAD  Veneta Penton, Hershal Coria 06/11/2018, 1:37 PM  [facility] Pager: [phone] I have seen and examined this patient and agree with the plan of care seem. Examined,eval. Discussed with PA , patient. Jeneen Rinks Ebba Goll 06/11/2018, 2:38 PM

## 2018-06-11 NOTE — ED Notes (Signed)
Pt received breakfast tray 

## 2018-06-12 LAB — GLUCOSE, CAPILLARY
GLUCOSE-CAPILLARY: 117 mg/dL — AB (ref 70–99)
GLUCOSE-CAPILLARY: 133 mg/dL — AB (ref 70–99)
Glucose-Capillary: 130 mg/dL — ABNORMAL HIGH (ref 70–99)

## 2018-06-12 LAB — IRON AND TIBC
IRON: 16 ug/dL — AB (ref 45–182)
Saturation Ratios: 10 % — ABNORMAL LOW (ref 17.9–39.5)
TIBC: 168 ug/dL — AB (ref 250–450)
UIBC: 152 ug/dL

## 2018-06-12 LAB — TROPONIN I: Troponin I: 0.14 ng/mL (ref <0.03)

## 2018-06-12 LAB — HEMOGLOBIN AND HEMATOCRIT, BLOOD
HCT: 24.2 % — ABNORMAL LOW (ref 39.0–52.0)
HEMOGLOBIN: 7.7 g/dL — AB (ref 13.0–17.0)

## 2018-06-12 MED ORDER — VANCOMYCIN HCL IN DEXTROSE 1-5 GM/200ML-% IV SOLN
INTRAVENOUS | Status: AC
  Administered 2018-06-12: 1000 mg via INTRAVENOUS
  Filled 2018-06-12: qty 200

## 2018-06-12 MED ORDER — DOXERCALCIFEROL 4 MCG/2ML IV SOLN
INTRAVENOUS | Status: AC
  Administered 2018-06-12: 4 ug via INTRAVENOUS
  Filled 2018-06-12: qty 2

## 2018-06-12 MED ORDER — DARBEPOETIN ALFA 200 MCG/0.4ML IJ SOSY
PREFILLED_SYRINGE | INTRAMUSCULAR | Status: AC
  Administered 2018-06-12: 200 ug via INTRAVENOUS
  Filled 2018-06-12: qty 0.4

## 2018-06-12 MED ORDER — DARBEPOETIN ALFA 200 MCG/0.4ML IJ SOSY
200.0000 ug | PREFILLED_SYRINGE | INTRAMUSCULAR | Status: DC
  Administered 2018-06-12: 200 ug via INTRAVENOUS
  Filled 2018-06-12: qty 0.4

## 2018-06-12 MED ORDER — SODIUM CHLORIDE 0.9 % IV SOLN: 100.0000 mL | INTRAVENOUS | Status: DC | PRN

## 2018-06-12 NOTE — Progress Notes (Signed)
Subjective: Interval History: has no complaint, still weak., no cough  Objective: Vital signs in last 24 hours: Temp:  [98.5 F (36.9 C)-103 F (39.4 C)] 98.5 F (36.9 C) (09/12 0500) Pulse Rate:  [85-97] 85 (09/12 0500) Resp:  [22] 22 (09/11 0930) BP: (106-141)/(54-61) 116/60 (09/12 0500) SpO2:  [89 %-98 %] 97 % (09/12 0500) Weight:  [96.6 kg] 96.6 kg (09/12 0500) Weight change:   Intake/Output from previous day: 09/11 0701 - 09/12 0700 In: 290 [P.O.:240; IV Piggyback:50] Out: -  Intake/Output this shift: No intake/output data recorded.  General appearance: no distress and moderately obese Resp: diminished breath sounds bilaterally Cardio: S1, S2 normal and systolic murmur: systolic ejection 2/6, crescendo and decrescendo at 2nd left intercostal space GI: soft, non-tender; bowel sounds normal; no masses,  no organomegaly Extremities: edema 1+ and AVF RUA  Lab Results: Recent Labs    06/11/18 0403 06/11/18 1455 06/12/18 0509  WBC 10.8* 12.5*  --   HGB 7.9* 9.2* 7.7*  HCT 25.4* 29.8* 24.2*  PLT 278 294  --    BMET:  Recent Labs    06/10/18 2232 06/11/18 0403  NA 138 139  K 4.6 4.4  CL 100 101  CO2 25 25  GLUCOSE 148* 150*  BUN 25* 31*  CREATININE 7.22* 7.88*  CALCIUM 7.7* 7.7*   No results for input(s): PTH in the last 72 hours. Iron Studies: No results for input(s): IRON, TIBC, TRANSFERRIN, FERRITIN in the last 72 hours.  Studies/Results: Dg Chest 2 View  Result Date: 06/10/2018 CLINICAL DATA:  Shortness of breath. EXAM: CHEST - 2 VIEW COMPARISON:  Radiograph 01/15/2018 FINDINGS: Patchy and confluent consolidation in the right lower lobe, new from prior exam. Upper normal heart size normal mediastinal contours. Aortic atherosclerosis. No pulmonary edema, pleural effusion or pneumothorax. Vascular stent in the right axilla. Degenerative change of the shoulders. IMPRESSION: Patchy and confluent consolidation in the right lower lobe, suspicious for pneumonia.  Followup PA and lateral chest X-ray is recommended in 3-4 weeks following trial of antibiotic therapy to ensure resolution and exclude underlying malignancy. Electronically Signed   By: Keith Rake M.D.   On: 06/10/2018 23:17   Ct Head Wo Contrast  Result Date: 06/11/2018 CLINICAL DATA:  Altered mental status. Fall getting up from recliner. EXAM: CT HEAD WITHOUT CONTRAST TECHNIQUE: Contiguous axial images were obtained from the base of the skull through the vertex without intravenous contrast. COMPARISON:  Head CT 10/16/2016 FINDINGS: Brain: Age related atrophy. Mild chronic small vessel ischemia. No intracranial hemorrhage, mass effect, or midline shift. No hydrocephalus. The basilar cisterns are patent. No evidence of territorial infarct or acute ischemia. No extra-axial or intracranial fluid collection. Vascular: Atherosclerosis of skullbase vasculature without hyperdense vessel or abnormal calcification. Skull: No fracture or focal lesion. Sinuses/Orbits: Paranasal sinuses and mastoid air cells are clear. The visualized orbits are unremarkable. Other: None. IMPRESSION: No acute intracranial abnormality. Electronically Signed   By: Keith Rake M.D.   On: 06/11/2018 01:31    I have reviewed the patient's current medications.  Assessment/Plan: 1 ESRD for HD 2 Anemia use esa 3 ? GIB per GI 4 HPTH cinn, vit D 5 Obesity 6 DM controlled 7 Infx source not clear, C&S neg thus far, febrile last pm on AB P HD, AB, follow temp, cultures.    LOS: 1 day   Jeneen Rinks [place] Diana 06/12/2018,9:24 AM

## 2018-06-12 NOTE — Procedures (Signed)
I was present at this session.  I have reviewed the session itself and made appropriate changes. HD via RUA avf. Access prss ok. bp starting in 90s. tol thus far.  Jeneen Rinks Lemarcus Baggerly 9/12/20199:21 AM

## 2018-06-12 NOTE — Progress Notes (Signed)
Placed patient on home CPAP machine  

## 2018-06-12 NOTE — Progress Notes (Signed)
[facility] Progress Note  Subjective: The patient denies any bright red rectal bleeding. He states his stools are always black in color because he is on iron. He has had a drop in hemoglobin overnight. Denies hematemesis. Currently in dialysis. He had a colonoscopy last year with a history of radiation proctitis and a rectal ulcer was seen.  Objective: Vital signs in last 24 hours: Temp:  [98.5 F (36.9 C)-103 F (39.4 C)] 98.5 F (36.9 C) (09/12 0500) Pulse Rate:  [85-97] 85 (09/12 0500) Resp:  [22] 22 (09/11 0930) BP: (106-141)/(54-61) 116/60 (09/12 0500) SpO2:  [89 %-98 %] 97 % (09/12 0500) Weight:  [96.6 kg] 96.6 kg (09/12 0500) Weight change:    PE:  No distress  Lab Results: Results for orders placed or performed during the hospital encounter of 06/10/18 (from the past 24 hour(s))  Glucose, capillary     Status: Abnormal   Collection Time: 06/11/18  1:18 PM  Result Value Ref Range   Glucose-Capillary 192 (H) 70 - 99 mg/dL  CBC     Status: Abnormal   Collection Time: 06/11/18  2:55 PM  Result Value Ref Range   WBC 12.5 (H) 4.0 - 10.5 K/uL   RBC 2.95 (L) 4.22 - 5.81 MIL/uL   Hemoglobin 9.2 (L) 13.0 - 17.0 g/dL   HCT 29.8 (L) 39.0 - 52.0 %   MCV 101.0 (H) 78.0 - 100.0 fL   MCH 31.2 26.0 - 34.0 pg   MCHC 30.9 30.0 - 36.0 g/dL   RDW 17.1 (H) 11.5 - 15.5 %   Platelets 294 150 - 400 K/uL  Glucose, capillary     Status: Abnormal   Collection Time: 06/11/18  5:08 PM  Result Value Ref Range   Glucose-Capillary 160 (H) 70 - 99 mg/dL  Glucose, capillary     Status: Abnormal   Collection Time: 06/11/18  9:59 PM  Result Value Ref Range   Glucose-Capillary 171 (H) 70 - 99 mg/dL  Troponin I (q 6hr x 3)     Status: Abnormal   Collection Time: 06/12/18  5:00 AM  Result Value Ref Range   Troponin I 0.14 (HH) <0.03 ng/mL  Hemoglobin and hematocrit, blood     Status: Abnormal   Collection Time: 06/12/18  5:09 AM  Result Value Ref Range   Hemoglobin 7.7 (L) 13.0 -  17.0 g/dL   HCT 24.2 (L) 39.0 - 52.0 %  Glucose, capillary     Status: Abnormal   Collection Time: 06/12/18  7:44 AM  Result Value Ref Range   Glucose-Capillary 117 (H) 70 - 99 mg/dL    Studies/Results: No results found.    Assessment: Anemia and heme positive stool  Plan:   I will plan EGD tomorrow to determine if he might have an ulcer or something else causing a dark black-colored stool. I would think if his radiation proctitis or prior rectal ulcer were causing a problem of anemia he would be having bright red rectal bleeding which he is not.    Cassell Clement 06/12/2018, 9:14 AM  Pager: [phone] If no answer or after 5 PM call [phone] Lab Results  Component Value Date   HGB 7.7 (L) 06/12/2018   HGB 9.2 (L) 06/11/2018   HGB 7.9 (L) 06/11/2018   HGB 11.7 (L) 11/20/2016   HCT 24.2 (L) 06/12/2018   HCT 29.8 (L) 06/11/2018   HCT 25.4 (L) 06/11/2018   HCT 35.6 (L) 11/20/2016   ALKPHOS 220 (H) 06/11/2018   ALKPHOS  243 (H) 06/10/2018   ALKPHOS 271 (H) 05/14/2018   AST 24 06/11/2018   AST 32 06/10/2018   AST 39 (H) 05/14/2018   ALT 61 (H) 06/11/2018   ALT 72 (H) 06/10/2018   ALT 78 (H) 05/14/2018   AMYLASE 60 10/31/2015   AMYLASE 37 12/28/2009

## 2018-06-12 NOTE — Plan of Care (Signed)
  Problem: Education: Goal: Knowledge of General Education information will improve Description Including pain rating scale, medication(s)/side effects and non-pharmacologic comfort measures Outcome: Progressing   Problem: Clinical Measurements: Goal: Ability to maintain clinical measurements within normal limits will improve Outcome: Progressing   Problem: Coping: Goal: Level of anxiety will decrease Outcome: Not Progressing

## 2018-06-12 NOTE — Progress Notes (Addendum)
PROGRESS NOTE                                                                                                                                                                                                             Patient Demographics:    [name], is a 67 y.o. male, DOB - 13-Nov-1950, ACZ:[id number]  Admit date - 06/10/2018   Admitting Physician Rise Patience, MD  Outpatient Primary MD for the patient is Biagio Borg, MD  LOS - 1  Outpatient Specialists: renal/ [facility]  Chief Complaint  Patient presents with  . Shortness of Breath       Brief Narrative   67 year old male with history of ESRD on hemodialysis (T, T, SAT), CAD status post stenting, AVNRT status post ablation, chronic combined systolic and diastolic CHF (EF of 10-93% in 2018), chronic anemia, gout, hyperthyroidism presented to the ED with fever and chills since then night prior to admission.  Patient had visual hemodialysis following which she had small amount of bleeding from the AV fistula site which was controlled with pressure.  After returning home he started having fevers with chills and increasing shortness of breath. In the ED he was febrile with temperature of 102 F with chest x-ray showing infiltrates.  Blood cultures ordered and patient placed on empiric antibiotics.  He was also found to have drop in about 3 g of hemoglobin from last month with positive stool for Hemoccult.  He has history of GI bleed from radiation proctitis and had sigmoidoscopy in October 2018 showing ulceration in the rectum.  Patient denied any bright red blood per rectum or melena at this time.    Subjective:   Denies any shortness of breath.  Reportedly fell on the floor in his room yesterday.  No injury sustained.   Assessment  & Plan :    Principal Problem:   HCAP (healthcare-associated pneumonia) Denies any cough or shortness of breath.  T-max 103 F  yesterday night.  Chest x-ray with infiltrate but possibly could have a viral pneumonia.  Will de-escalate antibiotics if remains afebrile today.  Blood cultures are negative to date.  Follow strep and Legionella antigen.  Active Problems:   Anemia associated with acute blood loss Noted for 3 g drop in his H&H from baseline.  Type and screen. Recently received ESA.  Plan on a supporting this admission.  Check iron panel.  Transfuse if hemoglobin less than 7.  History of radiation proctitis, sigmoidoscopy done 1 year back showing rectal ulceration.  Seen by [facility] and plans on EGD tomorrow.       Ischemic cardiomyopathy Euvolemic.  Volume management with dialysis.  Intolerant to statin.     Hyperthyroidism Resume Tapazole    Malignant neoplasm of prostate ([place]) Status post radiation    DM (diabetes mellitus) type II controlled with renal manifestation ([place]) Not on any medications at home.  Monitor on sliding scale coverage    PAD (peripheral artery disease) ([place])    ESRD on hemodialysis ([place]) Renal following.  Schedule hemodialysis this morning.  Continue phosphate binders  Elevated troponin Possibly demand ischemia.  No chest pain symptoms or EKG changes.  Off cardiac monitor.       Code Status : Full code  Family Communication  : None at bedside  Disposition Plan  : Home possibly in 24-48 hours if remains afebrile and cultures negative.  EGD tomorrow.  Barriers For Discharge : Active symptoms  Consults  : [facility], nephrology  Procedures  : None  DVT Prophylaxis  : SCDs  Lab Results  Component Value Date   PLT 294 06/11/2018    Antibiotics  :    Anti-infectives (From admission, onward)   Start     Dose/Rate Route Frequency Ordered Stop   06/12/18 1200  vancomycin (VANCOCIN) IVPB 1000 mg/200 mL premix     1,000 mg 200 mL/hr over 60 Minutes Intravenous Every T-Th-Sa (Hemodialysis) 06/11/18 0718     06/12/18 1000  piperacillin-tazobactam (ZOSYN) IVPB 3.375 g      3.375 g 12.5 mL/hr over 240 Minutes Intravenous Every 12 hours 06/11/18 0020     06/11/18 0045  vancomycin (VANCOCIN) 2,000 mg in sodium chloride 0.9 % 500 mL IVPB     2,000 mg 250 mL/hr over 120 Minutes Intravenous  Once 06/11/18 0004 06/11/18 0329   06/11/18 0015  piperacillin-tazobactam (ZOSYN) IVPB 3.375 g     3.375 g 100 mL/hr over 30 Minutes Intravenous  Once 06/11/18 0000 06/11/18 0125        Objective:   Vitals:   06/11/18 2201 06/11/18 2225 06/12/18 0500 06/12/18 0911  BP: (!) 141/58  116/60 (!) 153/77  Pulse: 97  85 84  Resp:    18  Temp: 98.9 F (37.2 C) (!) 103 F (39.4 C) 98.5 F (36.9 C) 98.4 F (36.9 C)  TempSrc: Oral  Axillary Oral  SpO2: 98%  97%   Weight:   96.6 kg 101.9 kg    Wt Readings from Last 3 Encounters:  06/12/18 101.9 kg  06/09/18 103 kg  06/04/18 101.7 kg     Intake/Output Summary (Last 24 hours) at 06/12/2018 1118 Last data filed at 06/11/2018 1812 Gross per 24 hour  Intake 290 ml  Output -  Net 290 ml    Physical exam Not in distress HEENT: Pallor present, moist mucosa, supple neck Chest: Clear bilaterally CVs: Normal S1 and S2, no murmurs GI: Soft, nondistended, nontender Musculoskeletal: Warm, no edema, right AV fistula site  without any bleeding, erythema or swelling.      Data Review:    CBC Recent Labs  Lab 06/10/18 2232 06/11/18 0403 06/11/18 1455 06/12/18 0509  WBC 11.2* 10.8* 12.5*  --   HGB 8.6* 7.9* 9.2* 7.7*  HCT 27.5* 25.4* 29.8* 24.2*  PLT 266 278 294  --   MCV  99.6 100.4* 101.0*  --   MCH 31.2 31.2 31.2  --   MCHC 31.3 31.1 30.9  --   RDW 17.0* 17.0* 17.1*  --   LYMPHSABS 0.9  --   --   --   MONOABS 1.7*  --   --   --   EOSABS 0.1  --   --   --   BASOSABS 0.0  --   --   --     Chemistries  Recent Labs  Lab 06/10/18 2232 06/11/18 0403  NA 138 139  K 4.6 4.4  CL 100 101  CO2 25 25  GLUCOSE 148* 150*  BUN 25* 31*  CREATININE 7.22* 7.88*  CALCIUM 7.7* 7.7*  AST 32 24  ALT 72* 61*    ALKPHOS 243* 220*  BILITOT 0.8 0.9   ------------------------------------------------------------------------------------------------------------------ No results for input(s): CHOL, HDL, LDLCALC, TRIG, CHOLHDL, LDLDIRECT in the last 72 hours.  Lab Results  Component Value Date   HGBA1C 6.3 (A) 05/16/2018   ------------------------------------------------------------------------------------------------------------------ No results for input(s): TSH, T4TOTAL, T3FREE, THYROIDAB in the last 72 hours.  Invalid input(s): FREET3 ------------------------------------------------------------------------------------------------------------------ No results for input(s): VITAMINB12, FOLATE, FERRITIN, TIBC, IRON, RETICCTPCT in the last 72 hours.  Coagulation profile Recent Labs  Lab 06/10/18 2232  INR 1.13    No results for input(s): DDIMER in the last 72 hours.  Cardiac Enzymes Recent Labs  Lab 06/11/18 0403 06/12/18 0500  TROPONINI 0.04* 0.14*   ------------------------------------------------------------------------------------------------------------------    Component Value Date/Time   BNP 54.9 08/09/2015 0045    Inpatient Medications  Scheduled Meds: . Chlorhexidine Gluconate Cloth  6 each Topical Q0600  . cinacalcet  90 mg Oral Q supper  . colesevelam  625 mg Oral Q breakfast  . darbepoetin (ARANESP) injection - DIALYSIS  200 mcg Intravenous Q Thu-HD  . doxercalciferol  4 mcg Intravenous Q T,Th,Sa-HD  . ezetimibe  10 mg Oral Daily  . ferric citrate  420 mg Oral TID WC  . gabapentin  300 mg Oral QHS  . insulin aspart  0-5 Units Subcutaneous QHS  . insulin aspart  0-9 Units Subcutaneous TID WC  . methimazole  2.5 mg Oral Daily  . multivitamin  1 tablet Oral QHS   Continuous Infusions: . sodium chloride    . sodium chloride    . famotidine (PEPCID) IV Stopped (06/11/18 1204)  . piperacillin-tazobactam (ZOSYN)  IV    . vancomycin     PRN Meds:.sodium chloride,  sodium chloride, acetaminophen **OR** acetaminophen, hydroxypropyl methylcellulose / hypromellose, ondansetron **OR** ondansetron (ZOFRAN) IV  Micro Results Recent Results (from the past 240 hour(s))  Culture, blood (Routine x 2)     Status: None (Preliminary result)   Collection Time: 06/10/18 11:25 PM  Result Value Ref Range Status   Specimen Description BLOOD LEFT ANTECUBITAL  Final   Special Requests   Final    BOTTLES DRAWN AEROBIC AND ANAEROBIC Blood Culture results may not be optimal due to an inadequate volume of blood received in culture bottles   Culture   Final    NO GROWTH 1 DAY Performed at [facility], [place] [address], [place], [place] [address]    Report Status PENDING  Incomplete  Culture, blood (Routine x 2)     Status: None (Preliminary result)   Collection Time: 06/10/18 11:39 PM  Result Value Ref Range Status   Specimen Description BLOOD LEFT ARM  Final   Special Requests   Final    BOTTLES DRAWN AEROBIC AND  ANAEROBIC Blood Culture results may not be optimal due to an inadequate volume of blood received in culture bottles   Culture   Final    NO GROWTH 1 DAY Performed at [facility] [address] [place], [place] [address]    Report Status PENDING  Incomplete    Radiology Reports Dg Chest 2 View  Result Date: 06/10/2018 CLINICAL DATA:  Shortness of breath. EXAM: CHEST - 2 VIEW COMPARISON:  Radiograph 01/15/2018 FINDINGS: Patchy and confluent consolidation in the right lower lobe, new from prior exam. Upper normal heart size normal mediastinal contours. Aortic atherosclerosis. No pulmonary edema, pleural effusion or pneumothorax. Vascular stent in the right axilla. Degenerative change of the shoulders. IMPRESSION: Patchy and confluent consolidation in the right lower lobe, suspicious for pneumonia. Followup PA and lateral chest X-ray is recommended in 3-4 weeks following trial of antibiotic therapy to ensure resolution and exclude underlying  malignancy. Electronically Signed   By: Keith Rake M.D.   On: 06/10/2018 23:17   Ct Head Wo Contrast  Result Date: 06/11/2018 CLINICAL DATA:  Altered mental status. Fall getting up from recliner. EXAM: CT HEAD WITHOUT CONTRAST TECHNIQUE: Contiguous axial images were obtained from the base of the skull through the vertex without intravenous contrast. COMPARISON:  Head CT 10/16/2016 FINDINGS: Brain: Age related atrophy. Mild chronic small vessel ischemia. No intracranial hemorrhage, mass effect, or midline shift. No hydrocephalus. The basilar cisterns are patent. No evidence of territorial infarct or acute ischemia. No extra-axial or intracranial fluid collection. Vascular: Atherosclerosis of skullbase vasculature without hyperdense vessel or abnormal calcification. Skull: No fracture or focal lesion. Sinuses/Orbits: Paranasal sinuses and mastoid air cells are clear. The visualized orbits are unremarkable. Other: None. IMPRESSION: No acute intracranial abnormality. Electronically Signed   By: Keith Rake M.D.   On: 06/11/2018 01:31    Time Spent in minutes 25   Darica Goren M.D on 06/12/2018 at 11:18 AM  Between 7am to 7pm - Pager - [phone]  After 7pm go to [email or web address] - password [facility]  [facility] -  Office  [phone]

## 2018-06-13 ENCOUNTER — Inpatient Hospital Stay (HOSPITAL_COMMUNITY): Payer: Medicare Other | Admitting: Certified Registered"

## 2018-06-13 ENCOUNTER — Encounter (HOSPITAL_COMMUNITY): Payer: Self-pay | Admitting: Certified Registered"

## 2018-06-13 ENCOUNTER — Encounter (HOSPITAL_COMMUNITY)
Admission: EM | Disposition: A | Discharge status: Home Health | Payer: Self-pay | Source: Home / Self Care | Attending: Internal Medicine

## 2018-06-13 ENCOUNTER — Other Ambulatory Visit: Payer: Self-pay

## 2018-06-13 DIAGNOSIS — J189 Pneumonia, unspecified organism: Principal | ICD-10-CM

## 2018-06-13 HISTORY — PX: ESOPHAGOGASTRODUODENOSCOPY (EGD) WITH PROPOFOL: SHX5813

## 2018-06-13 LAB — GLUCOSE, CAPILLARY
GLUCOSE-CAPILLARY: 118 mg/dL — AB (ref 70–99)
GLUCOSE-CAPILLARY: 174 mg/dL — AB (ref 70–99)
Glucose-Capillary: 135 mg/dL — ABNORMAL HIGH (ref 70–99)
Glucose-Capillary: 180 mg/dL — ABNORMAL HIGH (ref 70–99)

## 2018-06-13 LAB — CBC
HCT: 24.5 % — ABNORMAL LOW (ref 39.0–52.0)
Hemoglobin: 7.7 g/dL — ABNORMAL LOW (ref 13.0–17.0)
MCH: 30.7 pg (ref 26.0–34.0)
MCHC: 31.4 g/dL (ref 30.0–36.0)
MCV: 97.6 fL (ref 78.0–100.0)
PLATELETS: 306 10*3/uL (ref 150–400)
RBC: 2.51 MIL/uL — AB (ref 4.22–5.81)
RDW: 16.4 % — ABNORMAL HIGH (ref 11.5–15.5)
WBC: 10.4 10*3/uL (ref 4.0–10.5)

## 2018-06-13 SURGERY — ESOPHAGOGASTRODUODENOSCOPY (EGD) WITH PROPOFOL
Anesthesia: Monitor Anesthesia Care

## 2018-06-13 MED ORDER — MIDODRINE HCL 5 MG PO TABS
10.0000 mg | ORAL_TABLET | ORAL | Status: DC
  Administered 2018-06-14: 10 mg via ORAL
  Filled 2018-06-13: qty 2

## 2018-06-13 MED ORDER — ALLOPURINOL 100 MG PO TABS
100.0000 mg | ORAL_TABLET | Freq: Every day | ORAL | Status: DC
  Administered 2018-06-13 – 2018-06-15 (×3): 100 mg via ORAL
  Filled 2018-06-13 (×3): qty 1

## 2018-06-13 MED ORDER — ZOLPIDEM TARTRATE 5 MG PO TABS
5.0000 mg | ORAL_TABLET | Freq: Every day | ORAL | Status: DC
  Administered 2018-06-13 – 2018-06-15 (×2): 5 mg via ORAL
  Filled 2018-06-13 (×2): qty 1

## 2018-06-13 MED ORDER — GABAPENTIN 300 MG PO CAPS
600.0000 mg | ORAL_CAPSULE | Freq: Every day | ORAL | Status: DC
  Administered 2018-06-13: 600 mg via ORAL
  Filled 2018-06-13: qty 2

## 2018-06-13 MED ORDER — ACETAMINOPHEN 325 MG PO TABS: 650.0000 mg | ORAL_TABLET | Freq: Four times a day (QID) | ORAL | Status: DC | PRN

## 2018-06-13 MED ORDER — CHLORHEXIDINE GLUCONATE CLOTH 2 % EX PADS
6.0000 | MEDICATED_PAD | Freq: Every day | CUTANEOUS | Status: DC
  Administered 2018-06-13 – 2018-06-14 (×2): 6 via TOPICAL

## 2018-06-13 MED ORDER — SODIUM CHLORIDE 0.9 % IV SOLN
INTRAVENOUS | Status: DC
  Administered 2018-06-13: 08:00:00 via INTRAVENOUS

## 2018-06-13 MED ORDER — ASPIRIN EC 81 MG PO TBEC
81.0000 mg | DELAYED_RELEASE_TABLET | Freq: Every day | ORAL | Status: DC
  Administered 2018-06-13 – 2018-06-15 (×3): 81 mg via ORAL
  Filled 2018-06-13 (×3): qty 1

## 2018-06-13 MED ORDER — SODIUM CHLORIDE 0.9 % IV SOLN
125.0000 mg | INTRAVENOUS | Status: DC
  Administered 2018-06-15: 125 mg via INTRAVENOUS
  Filled 2018-06-13: qty 10

## 2018-06-13 MED ORDER — POLYETHYLENE GLYCOL 3350 17 G PO PACK: 17.0000 g | PACK | Freq: Every day | ORAL | Status: DC | PRN

## 2018-06-13 MED ORDER — PANTOPRAZOLE SODIUM 40 MG PO TBEC
40.0000 mg | DELAYED_RELEASE_TABLET | Freq: Every evening | ORAL | Status: DC
  Administered 2018-06-13 – 2018-06-14 (×2): 40 mg via ORAL
  Filled 2018-06-13 (×2): qty 1

## 2018-06-13 MED ORDER — PROPOFOL 10 MG/ML IV BOLUS
INTRAVENOUS | Status: DC | PRN
  Administered 2018-06-13: 10 mg via INTRAVENOUS
  Administered 2018-06-13: 30 mg via INTRAVENOUS
  Administered 2018-06-13 (×2): 10 mg via INTRAVENOUS

## 2018-06-13 MED ORDER — PROPOFOL 500 MG/50ML IV EMUL
INTRAVENOUS | Status: DC | PRN
  Administered 2018-06-13: 50 ug/kg/min via INTRAVENOUS

## 2018-06-13 MED ORDER — CITALOPRAM HYDROBROMIDE 10 MG PO TABS
10.0000 mg | ORAL_TABLET | Freq: Every day | ORAL | Status: DC
  Administered 2018-06-13 – 2018-06-14 (×2): 10 mg via ORAL
  Filled 2018-06-13 (×2): qty 1

## 2018-06-13 MED ORDER — SACCHAROMYCES BOULARDII 250 MG PO CAPS
250.0000 mg | ORAL_CAPSULE | Freq: Two times a day (BID) | ORAL | Status: DC
  Administered 2018-06-13 – 2018-06-15 (×5): 250 mg via ORAL
  Filled 2018-06-13 (×4): qty 1

## 2018-06-13 MED ORDER — LIDOCAINE 2% (20 MG/ML) 5 ML SYRINGE
INTRAMUSCULAR | Status: DC | PRN
  Administered 2018-06-13: 40 mg via INTRAVENOUS

## 2018-06-13 MED ORDER — FERRIC CITRATE 1 GM 210 MG(FE) PO TABS: 210.0000 mg | ORAL_TABLET | ORAL | Status: DC

## 2018-06-13 MED ORDER — CINACALCET HCL 30 MG PO TABS
90.0000 mg | ORAL_TABLET | Freq: Two times a day (BID) | ORAL | Status: DC
  Administered 2018-06-13 – 2018-06-15 (×5): 90 mg via ORAL
  Filled 2018-06-13 (×6): qty 3

## 2018-06-13 MED ORDER — ALIROCUMAB 150 MG/ML ~~LOC~~ SOPN: 150.0000 mg | PEN_INJECTOR | SUBCUTANEOUS | Status: DC

## 2018-06-13 SURGICAL SUPPLY — 15 items

## 2018-06-13 NOTE — Anesthesia Postprocedure Evaluation (Signed)
Anesthesia Post Note  Patient: [name]  Procedure(s) Performed: ESOPHAGOGASTRODUODENOSCOPY (EGD) WITH PROPOFOL (N/A )     Patient location during evaluation: Endoscopy Anesthesia Type: MAC Level of consciousness: awake Pain management: pain level controlled Vital Signs Assessment: post-procedure vital signs reviewed and stable Respiratory status: spontaneous breathing Cardiovascular status: stable Anesthetic complications: no    Last Vitals:  Vitals:   06/13/18 0942 06/13/18 0950  BP: (!) 95/47 (!) 104/53  Pulse: 80 84  Resp: 17 18  Temp: 37.2 C   SpO2: 97% 98%    Last Pain:  Vitals:   06/13/18 0950  TempSrc:   PainSc: 0-No pain                 Evadna Donaghy Dinino

## 2018-06-13 NOTE — Progress Notes (Signed)
Pharmacy Antibiotic Note  [name] is a 67 y.o. male admitted on 06/10/2018 with pneumonia.  Pharmacy has been consulted for zosyn and vancomycin dosing. He has h/o ESRD on HD.   On day #3 of antibiotics. WBC WNL. Afebrile - Tmax in 24 hours 100.2. BCx NGTD. No reports of SOB or cough  Plan:  Zosyn 3.375 g IV q12h (4hr infusion) Vancomycin 1000 mg IV with HD  Monitor clinical status, renal function and culture results daily Will monitor duration of antibiotic therapy - if continues will get vanc level prior to 3rd HD session   Height: 6\' 1"  (185.4 cm) Weight: 223 lb 15.8 oz (101.6 kg) IBW/kg (Calculated) : 79.9  Temp (24hrs), Avg:99 F (37.2 C), Min:98.2 F (36.8 C), Max:100.2 F (37.9 C)  Recent Labs  Lab 06/10/18 2232 06/10/18 2237 06/11/18 0403 06/11/18 1455 06/13/18 0422  WBC 11.2*  --  10.8* 12.5* 10.4  CREATININE 7.22*  --  7.88*  --   --   LATICACIDVEN  --  1.08  --   --   --     Estimated Creatinine Clearance: 11.1 mL/min (A) (by C-G formula based on SCr of 7.88 mg/dL (H)).    Allergies  Allergen Reactions  . Cephalexin Swelling and Other (See Comments)    Tongue swelling, but no breathing issues  . Statins Other (See Comments)    Weak muscles  . Ciprofloxacin Rash    Thank you for allowing pharmacy to be a part of this patient's care.  Doylene Canard, PharmD Clinical Pharmacist  Pager: [phone] Phone: [phone] Please check AMION for all [place] phone numbers After 10:00 PM, call [place] [phone] 06/13/2018 10:18 AM

## 2018-06-13 NOTE — Op Note (Signed)
[facility] Patient Name: [name] Procedure Date : 06/13/2018 MRN: [id number] Attending MD: Wonda Horner , MD Date of Birth: 01-12-1951 CSN: [id number] Age: 67 Admit Type: Inpatient Procedure:                Upper GI endoscopy Indications:              Iron deficiency anemia secondary to chronic blood                            loss, Melena Providers:                Wonda Horner, MD, Kingsley Plan, RN, Cletis Athens, Technician Referring MD:              Medicines:                Propofol per Anesthesia Complications:            No immediate complications. Estimated Blood Loss:     Estimated blood loss: none. Procedure:                Pre-Anesthesia Assessment:                           - Prior to the procedure, a History and Physical                            was performed, and patient medications and                            allergies were reviewed. The patient's tolerance of                            previous anesthesia was also reviewed. The risks                            and benefits of the procedure and the sedation                            options and risks were discussed with the patient.                            All questions were answered, and informed consent                            was obtained. Prior Anticoagulants: The patient has                            taken no previous anticoagulant or antiplatelet                            agents. ASA Grade Assessment: III - A patient with  severe systemic disease. After reviewing the risks                            and benefits, the patient was deemed in                            satisfactory condition to undergo the procedure.                           After obtaining informed consent, the endoscope was                            passed under direct vision. Throughout the                            procedure, the patient's blood pressure,  pulse, and                            oxygen saturations were monitored continuously. The                            [id number]-H190 ([id number] Olympus Adult EGD was introduced                            through the mouth, and advanced to the second part                            of duodenum. The upper GI endoscopy was                            accomplished without difficulty. The patient                            tolerated the procedure well. Scope In: Scope Out: Findings:      The examined esophagus was normal.      The entire examined stomach was normal.      The examined duodenum was normal. Impression:               - Normal esophagus.                           - Normal stomach.                           - Normal examined duodenum.                           - No specimens collected. Recommendation:           - Renal diet.                           - Continue present medications.                           - Observe for further bleeding. We will sign off.  Call us if needed. Procedure Code(s):        --- Professional ---                           [phone], Esophagogastroduodenoscopy, flexible,                            transoral; diagnostic, including collection of                            specimen(s) by brushing or washing, when performed                            (separate procedure) Diagnosis Code(s):        --- Professional ---                           D50.0, Iron deficiency anemia secondary to blood                            loss (chronic)                           K92.1, Melena (includes Hematochezia) CPT copyright 2017 [employer] Association. All rights reserved. The codes documented in this report are preliminary and upon coder review may  be revised to meet current compliance requirements. Wonda Horner, MD 06/13/2018 9:51:07 AM This report has been signed electronically. Number of Addenda: 0

## 2018-06-13 NOTE — Progress Notes (Signed)
Subjective: Interval History: has complaints severe weak, now D.  Objective: Vital signs in last 24 hours: Temp:  [98.2 F (36.8 C)-100.2 F (37.9 C)] 98.9 F (37.2 C) (09/13 0942) Pulse Rate:  [80-108] 84 (09/13 0950) Resp:  [17-18] 18 (09/13 0950) BP: (84-158)/(36-69) 104/53 (09/13 0950) SpO2:  [96 %-98 %] 98 % (09/13 0950) Weight:  [101.6 kg] 101.6 kg (09/13 0818) Weight change: 5.284 kg  Intake/Output from previous day: 09/12 0701 - 09/13 0700 In: 700.1 [P.O.:600; IV Piggyback:100.1] Out: 2001  Intake/Output this shift: Total I/O In: 50 [I.V.:50] Out: -   General appearance: alert, cooperative, mild distress and morbidly obese Resp: clear to auscultation bilaterally Cardio: S1, S2 normal and systolic murmur: systolic ejection 2/6, crescendo and decrescendo at 2nd left intercostal space GI: obese, pos bs, mild tenderness L mid abdm Extremities: AVF RUA  Lab Results: Recent Labs    06/11/18 1455 06/12/18 0509 06/13/18 0422  WBC 12.5*  --  10.4  HGB 9.2* 7.7* 7.7*  HCT 29.8* 24.2* 24.5*  PLT 294  --  306   BMET:  Recent Labs    06/10/18 2232 06/11/18 0403  NA 138 139  K 4.6 4.4  CL 100 101  CO2 25 25  GLUCOSE 148* 150*  BUN 25* 31*  CREATININE 7.22* 7.88*  CALCIUM 7.7* 7.7*   No results for input(s): PTH in the last 72 hours. Iron Studies:  Recent Labs    06/12/18 0500  IRON 16*  TIBC 168*    Studies/Results: No results found.  I have reviewed the patient's current medications.  Assessment/Plan: 1 ESRD  HD tomorrow 2 Fevers cultures neg, no cough, not typ of pneu, ??viral. 3 D ? aB induced.  4 DM controlled 5 Obesity 6 Weakness may need rehab facility 7 HPTH 8 Anemia EGD neg, ? Other source, stable now P HD, esa, follow hb, PT, ??w/u D    LOS: 2 days   Jeneen Rinks Aiman Sonn 06/13/2018,11:09 AM

## 2018-06-13 NOTE — Anesthesia Procedure Notes (Signed)
Procedure Name: MAC Date/Time: 06/13/2018 9:23 AM Performed by: Barrington Ellison, CRNA Pre-anesthesia Checklist: Patient identified, Emergency Drugs available, Suction available, Patient being monitored and Timeout performed Patient Re-evaluated:Patient Re-evaluated prior to induction Oxygen Delivery Method: Nasal cannula

## 2018-06-13 NOTE — Progress Notes (Signed)
PROGRESS NOTE    [name]  PYK:[id number] DOB: [birth year] DOA: 06/10/2018 PCP: Biagio Borg, MD    Brief Narrative: 67 year old male with history of ESRD on hemodialysis (T, T, SAT), CAD status post stenting, AVNRT status post ablation, chronic combined systolic and diastolic CHF (EF of 53-97% in 2018), chronic anemia, gout, hyperthyroidism presented to the ED with fever and chills since then night prior to admission.  Patient had visual hemodialysis following which she had small amount of bleeding from the AV fistula site which was controlled with pressure.  After returning home he started having fevers with chills and increasing shortness of breath. In the ED he was febrile with temperature of 102 F with chest x-ray showing infiltrates.  Blood cultures ordered and patient placed on empiric antibiotics.  He was also found to have drop in about 3 g of hemoglobin from last month with positive stool for Hemoccult.  He has history of GI bleed from radiation proctitis and had sigmoidoscopy in October 2018 showing ulceration in the rectum.  Patient denied any bright red blood per rectum or melena at this time.    Assessment & Plan:   Principal Problem:   HCAP (healthcare-associated pneumonia) Active Problems:   Ischemic cardiomyopathy   Anemia associated with acute blood loss   AVM (arteriovenous malformation)   Hyperthyroidism   Malignant neoplasm of prostate ([name])   DM (diabetes mellitus) type II controlled with renal manifestation ([name])   PAD (peripheral artery disease) ([name])   ESRD on hemodialysis ([place])   Essential hypertension   GI bleed  HCAP;  Presents with cough, fever. Chest x ray with infiltrates.  Blood culture negative.  Continue with IV antibiotics.   Anemia, associated with acute blood loss anemia;  endoscopy negative.  Hb stable at 7.7.  Still feels weak. Will defer to Nephrology for transfusion during HD.  Getting IV iron.   Ischemic cardiomyopathy;  Volume  manage with HD.   Hyperthyroidism on tapazole.   Malignant neoplasm of prostate ([place]) Status post radiation  Mild elevation of troponin; demand.   ERSD; on HD. Nephrology following.   DM; SSI.   DVT prophylaxis: SCD, no anticoagulation due to anemia Code Status: full code.  Family Communication: wife at bedside.  Disposition Plan: needs PT evaluation.    Consultants:   Nephrology  GI      Procedures:   HD  Endoscopy   Antimicrobials: Vancomycin and Zosyn   Subjective: He is still feeling weak, does not have energy  Objective: Vitals:   06/13/18 0942 06/13/18 0950 06/13/18 1140 06/13/18 1200  BP: (!) 95/47 (!) 104/53 113/62 111/77  Pulse: 80 84 85 66  Resp: 17 18 18 20   Temp: 98.9 F (37.2 C)  97.6 F (36.4 C)   TempSrc: Oral  Oral   SpO2: 97% 98% 95% 100%  Weight:      Height:        Intake/Output Summary (Last 24 hours) at 06/13/2018 1342 Last data filed at 06/13/2018 1156 Gross per 24 hour  Intake 800.06 ml  Output 0 ml  Net 800.06 ml   Filed Weights   06/12/18 0911 06/13/18 0542 06/13/18 0818  Weight: 101.9 kg 101.6 kg 101.6 kg    Examination:  General exam: NAD Respiratory system: CTA Cardiovascular system: S1 & S2 heard, RRR. No JVD, murmurs, rubs, gallops or clicks. No pedal edema. Gastrointestinal system: Abdomen is nondistended, soft and nontender. No organomegaly or masses felt. Normal bowel sounds heard. Central nervous system:  Alert and oriented. No focal neurological deficits. Extremities: Symmetric 5 x 5 power. Skin: No rashes, lesions or ulcers    Data Reviewed: I have personally reviewed following labs and imaging studies  CBC: Recent Labs  Lab 06/10/18 2232 06/11/18 0403 06/11/18 1455 06/12/18 0509 06/13/18 0422  WBC 11.2* 10.8* 12.5*  --  10.4  NEUTROABS 8.5*  --   --   --   --   HGB 8.6* 7.9* 9.2* 7.7* 7.7*  HCT 27.5* 25.4* 29.8* 24.2* 24.5*  MCV 99.6 100.4* 101.0*  --  97.6  PLT 266 278 294  --  876    Basic Metabolic Panel: Recent Labs  Lab 06/10/18 2232 06/11/18 0403  NA 138 139  K 4.6 4.4  CL 100 101  CO2 25 25  GLUCOSE 148* 150*  BUN 25* 31*  CREATININE 7.22* 7.88*  CALCIUM 7.7* 7.7*   GFR: Estimated Creatinine Clearance: 11.1 mL/min (A) (by C-G formula based on SCr of 7.88 mg/dL (H)). Liver Function Tests: Recent Labs  Lab 06/10/18 2232 06/11/18 0403  AST 32 24  ALT 72* 61*  ALKPHOS 243* 220*  BILITOT 0.8 0.9  PROT 6.9 6.4*  ALBUMIN 2.9* 2.6*   No results for input(s): LIPASE, AMYLASE in the last 168 hours. No results for input(s): AMMONIA in the last 168 hours. Coagulation Profile: Recent Labs  Lab 06/10/18 2232  INR 1.13   Cardiac Enzymes: Recent Labs  Lab 06/11/18 0403 06/12/18 0500  TROPONINI 0.04* 0.14*   BNP (last 3 results) No results for input(s): PROBNP in the last 8760 hours. HbA1C: No results for input(s): HGBA1C in the last 72 hours. CBG: Recent Labs  Lab 06/12/18 0744 06/12/18 1553 06/12/18 2132 06/13/18 0759 06/13/18 1139  GLUCAP 117* 130* 133* 118* 135*   Lipid Profile: No results for input(s): CHOL, HDL, LDLCALC, TRIG, CHOLHDL, LDLDIRECT in the last 72 hours. Thyroid Function Tests: No results for input(s): TSH, T4TOTAL, FREET4, T3FREE, THYROIDAB in the last 72 hours. Anemia Panel: Recent Labs    06/12/18 0500  TIBC 168*  IRON 16*   Sepsis Labs: Recent Labs  Lab 06/10/18 2237  LATICACIDVEN 1.08    Recent Results (from the past 240 hour(s))  Culture, blood (Routine x 2)     Status: None (Preliminary result)   Collection Time: 06/10/18 11:25 PM  Result Value Ref Range Status   Specimen Description BLOOD LEFT ANTECUBITAL  Final   Special Requests   Final    BOTTLES DRAWN AEROBIC AND ANAEROBIC Blood Culture results may not be optimal due to an inadequate volume of blood received in culture bottles   Culture   Final    NO GROWTH 2 DAYS Performed at [facility] [address] [place], [place] [address]     Report Status PENDING  Incomplete  Culture, blood (Routine x 2)     Status: None (Preliminary result)   Collection Time: 06/10/18 11:39 PM  Result Value Ref Range Status   Specimen Description BLOOD LEFT ARM  Final   Special Requests   Final    BOTTLES DRAWN AEROBIC AND ANAEROBIC Blood Culture results may not be optimal due to an inadequate volume of blood received in culture bottles   Culture   Final    NO GROWTH 2 DAYS Performed at [facility], [place] [address] [place], [place] [address]    Report Status PENDING  Incomplete         Radiology Studies: No results found.  Scheduled Meds: . allopurinol  100 mg Oral Daily  . aspirin EC  81 mg Oral Daily  . Chlorhexidine Gluconate Cloth  6 each Topical Q0600  . cinacalcet  90 mg Oral BID WC  . citalopram  10 mg Oral QHS  . colesevelam  625 mg Oral Q breakfast  . darbepoetin (ARANESP) injection - DIALYSIS  200 mcg Intravenous Q Thu-HD  . doxercalciferol  4 mcg Intravenous Q T,Th,Sa-HD  . ezetimibe  10 mg Oral Daily  . ferric citrate  420 mg Oral TID WC  . gabapentin  300 mg Oral QHS  . insulin aspart  0-5 Units Subcutaneous QHS  . insulin aspart  0-9 Units Subcutaneous TID WC  . methimazole  2.5 mg Oral Daily  . [START ON 06/14/2018] midodrine  10 mg Oral Once per day on Tue Thu Sat  . multivitamin  1 tablet Oral QHS  . pantoprazole  40 mg Oral QPM  . saccharomyces boulardii  250 mg Oral BID  . zolpidem  5 mg Oral QHS   Continuous Infusions: . famotidine (PEPCID) IV 20 mg (06/12/18 1428)  . [START ON 06/14/2018] ferric gluconate (FERRLECIT/NULECIT) IV    . piperacillin-tazobactam (ZOSYN)  IV 3.375 g (06/13/18 1156)  . vancomycin Stopped (06/12/18 1341)     LOS: 2 days    Time spent: 35 minutes.    Elmarie Shiley, MD [facility] Pager [phone]  If 7PM-7AM, please contact night-coverage [email or web address] Password [email or web address] 06/13/2018, 1:42 PM

## 2018-06-13 NOTE — H&P (Signed)
  Patient presents to endo dept. For EGD. For anemia and melena. PE: no distress Heart RRR Lungs crackles Abdomen soft IMP melena and anemia Plan EGD

## 2018-06-13 NOTE — Transfer of Care (Signed)
Immediate Anesthesia Transfer of Care Note  Patient: [name]  Procedure(s) Performed: ESOPHAGOGASTRODUODENOSCOPY (EGD) WITH PROPOFOL (N/A )  Patient Location: Endoscopy Unit  Anesthesia Type:MAC  Level of Consciousness: drowsy  Airway & Oxygen Therapy: Patient Spontanous Breathing and Patient connected to nasal cannula oxygen  Post-op Assessment: Report given to RN  Post vital signs: Reviewed and stable  Last Vitals:  Vitals Value Taken Time  BP 95/47 06/13/2018  9:42 AM  Temp 37.2 C 06/13/2018  9:42 AM  Pulse 81 06/13/2018  9:42 AM  Resp 18 06/13/2018  9:42 AM  SpO2 96 % 06/13/2018  9:42 AM  Vitals shown include unvalidated device data.  Last Pain:  Vitals:   06/13/18 0942  TempSrc: Oral  PainSc: 0-No pain      Patients Stated Pain Goal: 0 (48/54/62 7035)  Complications: No apparent anesthesia complications

## 2018-06-13 NOTE — Anesthesia Preprocedure Evaluation (Signed)
Anesthesia Evaluation  Patient identified by MRN, date of birth, ID band Patient awake    Reviewed: Allergy & Precautions, NPO status , Patient's Chart, lab work & pertinent test results  Airway Mallampati: II  TM Distance: >3 FB     Dental   Pulmonary shortness of breath, sleep apnea , pneumonia, former smoker,    breath sounds clear to auscultation       Cardiovascular hypertension, + CAD, + Past MI, + Peripheral Vascular Disease and +CHF   Rhythm:Irregular Rate:Normal     Neuro/Psych  Headaches,  Neuromuscular disease    GI/Hepatic PUD, GERD  ,  Endo/Other  diabetesHyperthyroidism   Renal/GU Renal disease     Musculoskeletal  (+) Arthritis ,   Abdominal   Peds  Hematology  (+) anemia ,   Anesthesia Other Findings   Reproductive/Obstetrics                             Anesthesia Physical Anesthesia Plan  ASA: III  Anesthesia Plan: MAC   Post-op Pain Management:    Induction: Intravenous  PONV Risk Score and Plan: Treatment may vary due to age or medical condition  Airway Management Planned: Simple Face Mask and Nasal Cannula  Additional Equipment:   Intra-op Plan:   Post-operative Plan:   Informed Consent: I have reviewed the patients History and Physical, chart, labs and discussed the procedure including the risks, benefits and alternatives for the proposed anesthesia with the patient or authorized representative who has indicated his/her understanding and acceptance.   Dental advisory given  Plan Discussed with: CRNA, Anesthesiologist and Surgeon  Anesthesia Plan Comments:         Anesthesia Quick Evaluation

## 2018-06-13 NOTE — Evaluation (Signed)
Physical Therapy Evaluation Patient Details Name: [name] MRN: [id number] DOB: 1950/10/29 Today's Date: 06/13/2018   History of Present Illness  67 year old male with history of ESRD on hemodialysis (T, T, SAT), CAD status post stenting, AVNRT status post ablation, chronic combined systolic and diastolic CHF (EF of 36-62% in 2018), chronic anemia, gout, hyperthyroidism presented to the ED with fever and chills since then night prior to admission.  Patient had visual hemodialysis following which she had small amount of bleeding from the AV fistula site which was controlled with pressure.  After returning home he started having fevers with chills and increasing shortness of breath.  Clinical Impression  Pt admitted with above diagnosis. Pt currently with functional limitations due to the deficits listed below (see PT Problem List). Pt was able to transfer to 3N1 and then to recliner with min assist.  Pt was very fatigued from 2 transfers and recommend SNF prior to d/c home but pt refusing therefore recommend max HH f/u.  Pt will benefit from skilled PT to increase their independence and safety with mobility to allow discharge to the venue listed below.      Follow Up Recommendations Home health PT/OT/RN/Aide;Supervision/Assistance - 24 hour(Pt refusing SNF but is highly recommended)    Equipment Recommendations  None recommended by PT    Recommendations for Other Services       Precautions / Restrictions Precautions Precautions: Fall Restrictions Weight Bearing Restrictions: No      Mobility  Bed Mobility Overal bed mobility: Needs Assistance Bed Mobility: Supine to Sit     Supine to sit: Min guard        Transfers Overall transfer level: Needs assistance Equipment used: Rolling walker (2 wheeled) Transfers: Sit to/from [facility] Sit to Stand: Min assist Stand pivot transfers: Min assist       General transfer comment: Needed min assist for safety  as pt heavily reliant on RW and not standing up all the way.  Bil LE weakness as well.  Assisted pt to 3N1 and then to the recliner.  Pt needed total assit to be cleaned after using bathroom and having BM.   Ambulation/Gait                Stairs            Wheelchair Mobility    Modified Rankin (Stroke Patients Only)       Balance Overall balance assessment: Needs assistance Sitting-balance support: No upper extremity supported;Feet supported Sitting balance-Leahy Scale: Fair     Standing balance support: Bilateral upper extremity supported;During functional activity Standing balance-Leahy Scale: Poor Standing balance comment: relies heavily on UE support                             Pertinent Vitals/Pain Pain Assessment: No/denies pain    Home Living Family/patient expects to be discharged to:: Private residence Living Arrangements: Spouse/significant other Available Help at Discharge: Family;Available 24 hours/day Type of Home: House Home Access: Stairs to enter Entrance Stairs-Rails: Right Entrance Stairs-Number of Steps: 3 Home Layout: One level Home Equipment: Walker - 2 wheels;Cane - single point;Shower seat;Bedside commode;Walker - 4 wheels      Prior Function Level of Independence: Independent with assistive device(s)         Comments: Used RW for ambulation. Reports he does not do alot on his dialysis days and mostly sits in recliner.      Hand Dominance  Dominant Hand: Left    Extremity/Trunk Assessment   Upper Extremity Assessment Upper Extremity Assessment: Defer to OT evaluation    Lower Extremity Assessment Lower Extremity Assessment: Generalized weakness    Cervical / Trunk Assessment Cervical / Trunk Assessment: Kyphotic  Communication   Communication: No difficulties  Cognition Arousal/Alertness: Awake/alert Behavior During Therapy: WFL for tasks assessed/performed Overall Cognitive Status: Within Functional  Limits for tasks assessed                                        General Comments      Exercises     Assessment/Plan    PT Assessment Patient needs continued PT services  PT Problem List Decreased balance;Decreased activity tolerance;Decreased mobility;Decreased knowledge of use of DME;Decreased safety awareness;Decreased knowledge of precautions       PT Treatment Interventions DME instruction;Gait training;Functional mobility training;Therapeutic activities;Therapeutic exercise;Balance training;Patient/family education    PT Goals (Current goals can be found in the Care Plan section)  Acute Rehab PT Goals Patient Stated Goal: to go home PT Goal Formulation: With patient Time For Goal Achievement: 06/27/18 Potential to Achieve Goals: Good    Frequency Min 3X/week   Barriers to discharge        Co-evaluation               AM-PAC PT "6 Clicks" Daily Activity  Outcome Measure Difficulty turning over in bed (including adjusting bedclothes, sheets and blankets)?: None Difficulty moving from lying on back to sitting on the side of the bed? : None Difficulty sitting down on and standing up from a chair with arms (e.g., wheelchair, bedside commode, etc,.)?: Unable Help needed moving to and from a bed to chair (including a wheelchair)?: A Little Help needed walking in hospital room?: A Lot Help needed climbing 3-5 steps with a railing? : A Lot 6 Click Score: 16    End of Session Equipment Utilized During Treatment: Gait belt Activity Tolerance: Patient limited by fatigue Patient left: in chair;with call bell/phone within reach;with chair alarm set Nurse Communication: Mobility status PT Visit Diagnosis: Unsteadiness on feet (R26.81);Muscle weakness (generalized) (M62.81)    Time: 8329-1916 PT Time Calculation (min) (ACUTE ONLY): 34 min   Charges:   PT Evaluation $PT Eval Moderate Complexity: 1 Mod PT Treatments $Therapeutic Activity: 8-22  mins        Janaya Broy,PT Acute Rehabilitation Services Pager:  [phone]  Office:  [phone]    Denice Paradise 06/13/2018, 4:24 PM

## 2018-06-14 LAB — RENAL FUNCTION PANEL
ANION GAP: 19 — AB (ref 5–15)
Albumin: 2.3 g/dL — ABNORMAL LOW (ref 3.5–5.0)
BUN: 43 mg/dL — ABNORMAL HIGH (ref 8–23)
CALCIUM: 8.7 mg/dL — AB (ref 8.9–10.3)
CHLORIDE: 94 mmol/L — AB (ref 98–111)
CO2: 24 mmol/L (ref 22–32)
Creatinine, Ser: 8.99 mg/dL — ABNORMAL HIGH (ref 0.61–1.24)
GFR calc non Af Amer: 5 mL/min — ABNORMAL LOW (ref >60)
GFR, EST AFRICAN AMERICAN: 6 mL/min — AB (ref >60)
GLUCOSE: 153 mg/dL — AB (ref 70–99)
POTASSIUM: 4.3 mmol/L (ref 3.5–5.1)
Phosphorus: 7 mg/dL — ABNORMAL HIGH (ref 2.5–4.6)
SODIUM: 137 mmol/L (ref 135–145)

## 2018-06-14 LAB — GLUCOSE, CAPILLARY
GLUCOSE-CAPILLARY: 146 mg/dL — AB (ref 70–99)
GLUCOSE-CAPILLARY: 150 mg/dL — AB (ref 70–99)
GLUCOSE-CAPILLARY: 130 mg/dL — AB (ref 70–99)
GLUCOSE-CAPILLARY: 207 mg/dL — AB (ref 70–99)

## 2018-06-14 LAB — CBC
HEMATOCRIT: 24 % — AB (ref 39.0–52.0)
HEMOGLOBIN: 7.6 g/dL — AB (ref 13.0–17.0)
MCH: 30.6 pg (ref 26.0–34.0)
MCHC: 31.7 g/dL (ref 30.0–36.0)
MCV: 96.8 fL (ref 78.0–100.0)
Platelets: 265 10*3/uL (ref 150–400)
RBC: 2.48 MIL/uL — AB (ref 4.22–5.81)
RDW: 16.4 % — ABNORMAL HIGH (ref 11.5–15.5)
WBC: 7.4 10*3/uL (ref 4.0–10.5)

## 2018-06-14 LAB — VITAMIN B12: Vitamin B-12: 1118 pg/mL — ABNORMAL HIGH (ref 180–914)

## 2018-06-14 MED ORDER — SODIUM CHLORIDE 0.9 % IV SOLN: 100.0000 mL | INTRAVENOUS | Status: DC | PRN

## 2018-06-14 MED ORDER — LIDOCAINE-PRILOCAINE 2.5-2.5 % EX CREA
1.0000 "application " | TOPICAL_CREAM | CUTANEOUS | Status: DC | PRN
  Filled 2018-06-14: qty 5

## 2018-06-14 MED ORDER — LIDOCAINE HCL (PF) 1 % IJ SOLN: 5.0000 mL | INTRAMUSCULAR | Status: DC | PRN

## 2018-06-14 MED ORDER — ALTEPLASE 2 MG IJ SOLR: 2.0000 mg | Freq: Once | INTRAMUSCULAR | Status: DC | PRN

## 2018-06-14 MED ORDER — PENTAFLUOROPROP-TETRAFLUOROETH EX AERO: 1.0000 "application " | INHALATION_SPRAY | CUTANEOUS | Status: DC | PRN

## 2018-06-14 MED ORDER — LEVOFLOXACIN IN D5W 750 MG/150ML IV SOLN
750.0000 mg | Freq: Once | INTRAVENOUS | Status: AC
  Administered 2018-06-15: 750 mg via INTRAVENOUS
  Filled 2018-06-14: qty 150

## 2018-06-14 MED ORDER — PRO-STAT SUGAR FREE PO LIQD
30.0000 mL | Freq: Two times a day (BID) | ORAL | Status: DC
  Administered 2018-06-14 – 2018-06-15 (×3): 30 mL via ORAL
  Filled 2018-06-14 (×3): qty 30

## 2018-06-14 MED ORDER — HEPARIN SODIUM (PORCINE) 1000 UNIT/ML DIALYSIS: 1000.0000 [IU] | INTRAMUSCULAR | Status: DC | PRN

## 2018-06-14 MED ORDER — FERRIC CITRATE 1 GM 210 MG(FE) PO TABS
840.0000 mg | ORAL_TABLET | Freq: Three times a day (TID) | ORAL | Status: DC
  Administered 2018-06-14 – 2018-06-15 (×2): 840 mg via ORAL
  Filled 2018-06-14 (×6): qty 4

## 2018-06-14 MED ORDER — GABAPENTIN 300 MG PO CAPS
300.0000 mg | ORAL_CAPSULE | Freq: Every day | ORAL | Status: DC
  Administered 2018-06-14: 300 mg via ORAL
  Filled 2018-06-14: qty 1

## 2018-06-14 NOTE — Progress Notes (Signed)
Patient decline any assistance with home CPAP at this time, no distress noted RCP will continue to follow.

## 2018-06-14 NOTE — Progress Notes (Addendum)
Pharmacy Antibiotic Note  [name] is a 67 y.o. male admitted on 06/10/2018 with pneumonia.  Pharmacy has been consulted for zosyn and vancomycin dosing. He has h/o ESRD on HD.   On day #4 of antibiotics. WBC WNL. Afebrile - Tmax in 24 hours 100.2. BCx NGTD. No reports of SOB or cough. Plan to change from vancomycin and zosyn to levaquin for 2 more days.   Given ESRD status, normally would give 750 mg once then 500 mg every 48 hours - will order as one time dose given stop date. Has rash listed to ciprofloxacin; however, upon review has had levofloxacin in the past.   Plan:  Levaquin 750 mg IV once  Monitor clinical status, renal function and culture results daily  Height: 6\' 1"  (185.4 cm) Weight: 221 lb (100.2 kg) IBW/kg (Calculated) : 79.9  Temp (24hrs), Avg:98.4 F (36.9 C), Min:98.2 F (36.8 C), Max:98.6 F (37 C)  Recent Labs  Lab 06/10/18 2232 06/10/18 2237 06/11/18 0403 06/11/18 1455 06/13/18 0422 06/14/18 0513  WBC 11.2*  --  10.8* 12.5* 10.4 7.4  CREATININE 7.22*  --  7.88*  --   --  8.99*  LATICACIDVEN  --  1.08  --   --   --   --     Estimated Creatinine Clearance: 9.7 mL/min (A) (by C-G formula based on SCr of 8.99 mg/dL (H)).    Allergies  Allergen Reactions  . Cephalexin Swelling and Other (See Comments)    Tongue swelling, but no breathing issues  . Statins Other (See Comments)    Weak muscles  . Ciprofloxacin Rash    Thank you for allowing pharmacy to be a part of this patient's care.  Doylene Canard, PharmD Clinical Pharmacist  Pager: [phone] Phone: [phone] Please check AMION for all [facility] phone numbers After 10:00 PM, call [place] [phone] 06/14/2018 1:12 PM

## 2018-06-14 NOTE — Progress Notes (Signed)
Pt has home CPAP at bedside and states he does not need any further assistance. RT will continue to monitor

## 2018-06-14 NOTE — Progress Notes (Signed)
PROGRESS NOTE    [name]  FMB:[id number] DOB: 12-12-1950 DOA: 06/10/2018 PCP: Biagio Borg, MD    Brief Narrative: 67 year old male with history of ESRD on hemodialysis (T, T, SAT), CAD status post stenting, AVNRT status post ablation, chronic combined systolic and diastolic CHF (EF of 70-17% in 2018), chronic anemia, gout, hyperthyroidism presented to the ED with fever and chills since then night prior to admission.  Patient had visual hemodialysis following which she had small amount of bleeding from the AV fistula site which was controlled with pressure.  After returning home he started having fevers with chills and increasing shortness of breath. In the ED he was febrile with temperature of 102 F with chest x-ray showing infiltrates.  Blood cultures ordered and patient placed on empiric antibiotics.  He was also found to have drop in about 3 g of hemoglobin from last month with positive stool for Hemoccult.  He has history of GI bleed from radiation proctitis and had sigmoidoscopy in October 2018 showing ulceration in the rectum.  Patient denied any bright red blood per rectum or melena at this time.    Assessment & Plan:   Principal Problem:   HCAP (healthcare-associated pneumonia) Active Problems:   Ischemic cardiomyopathy   Anemia associated with acute blood loss   AVM (arteriovenous malformation)   Hyperthyroidism   Malignant neoplasm of prostate ([name])   DM (diabetes mellitus) type II controlled with renal manifestation ([name])   PAD (peripheral artery disease) ([name])   ESRD on hemodialysis ([place])   Essential hypertension   GI bleed  HCAP;  Presents with cough, fever. Chest x ray with infiltrates.  Blood culture negative.  Day 4 IV antibiotics. 2 more doses of levaquin.   Anemia, associated with acute blood loss anemia;  endoscopy negative.  Hb stable at 7.7.---7.6  Still feels weak. Will defer to Nephrology for transfusion during HD.  Getting IV iron.   Ischemic  cardiomyopathy;  Volume manage with HD.   Hyperthyroidism on tapazole.   Malignant neoplasm of prostate ([place]) Status post radiation  Mild elevation of troponin; demand.   ERSD; on HD. Nephrology following.   DM; SSI.  Deconditioning;  Still very weak.  PT rec SNf,  Social work consult.    DVT prophylaxis: SCD, no anticoagulation due to anemia Code Status: full code.  Family Communication: wife at bedside.  Disposition Plan: needs PT evaluation.    Consultants:   Nephrology  GI      Procedures:   HD  Endoscopy   Antimicrobials: Vancomycin and Zosyn   Subjective: Feeling weak. Breathing better    Objective: Vitals:   06/13/18 1140 06/13/18 1200 06/13/18 1937 06/14/18 0627  BP: 113/62 111/77 116/62 (!) 98/47  Pulse: 85 66 86 78  Resp: 18 20 (!) 21 19  Temp: 97.6 F (36.4 C)  98.6 F (37 C) 98.2 F (36.8 C)  TempSrc: Oral  Oral Axillary  SpO2: 95% 100% 98% 95%  Weight:    100.2 kg  Height:        Intake/Output Summary (Last 24 hours) at 06/14/2018 1353 Last data filed at 06/13/2018 1800 Gross per 24 hour  Intake 240 ml  Output -  Net 240 ml   Filed Weights   06/13/18 0542 06/13/18 0818 06/14/18 0627  Weight: 101.6 kg 101.6 kg 100.2 kg    Examination:  General exam: NAD Respiratory system: CTA Cardiovascular system: S 1, S 2 RRR Gastrointestinal system: BS present, soft, nt Central nervous system: non  focal  Extremities: symmetric power.  Skin: No rashes, lesions or ulcers    Data Reviewed: I have personally reviewed following labs and imaging studies  CBC: Recent Labs  Lab 06/10/18 2232 06/11/18 0403 06/11/18 1455 06/12/18 0509 06/13/18 0422 06/14/18 0513  WBC 11.2* 10.8* 12.5*  --  10.4 7.4  NEUTROABS 8.5*  --   --   --   --   --   HGB 8.6* 7.9* 9.2* 7.7* 7.7* 7.6*  HCT 27.5* 25.4* 29.8* 24.2* 24.5* 24.0*  MCV 99.6 100.4* 101.0*  --  97.6 96.8  PLT 266 278 294  --  306 841   Basic Metabolic Panel: Recent Labs  Lab  06/10/18 2232 06/11/18 0403 06/14/18 0513  NA 138 139 137  K 4.6 4.4 4.3  CL 100 101 94*  CO2 25 25 24   GLUCOSE 148* 150* 153*  BUN 25* 31* 43*  CREATININE 7.22* 7.88* 8.99*  CALCIUM 7.7* 7.7* 8.7*  PHOS  --   --  7.0*   GFR: Estimated Creatinine Clearance: 9.7 mL/min (A) (by C-G formula based on SCr of 8.99 mg/dL (H)). Liver Function Tests: Recent Labs  Lab 06/10/18 2232 06/11/18 0403 06/14/18 0513  AST 32 24  --   ALT 72* 61*  --   ALKPHOS 243* 220*  --   BILITOT 0.8 0.9  --   PROT 6.9 6.4*  --   ALBUMIN 2.9* 2.6* 2.3*   No results for input(s): LIPASE, AMYLASE in the last 168 hours. No results for input(s): AMMONIA in the last 168 hours. Coagulation Profile: Recent Labs  Lab 06/10/18 2232  INR 1.13   Cardiac Enzymes: Recent Labs  Lab 06/11/18 0403 06/12/18 0500  TROPONINI 0.04* 0.14*   BNP (last 3 results) No results for input(s): PROBNP in the last 8760 hours. HbA1C: No results for input(s): HGBA1C in the last 72 hours. CBG: Recent Labs  Lab 06/13/18 1139 06/13/18 1708 06/13/18 2124 06/14/18 0742 06/14/18 1145  GLUCAP 135* 174* 180* 146* 150*   Lipid Profile: No results for input(s): CHOL, HDL, LDLCALC, TRIG, CHOLHDL, LDLDIRECT in the last 72 hours. Thyroid Function Tests: No results for input(s): TSH, T4TOTAL, FREET4, T3FREE, THYROIDAB in the last 72 hours. Anemia Panel: Recent Labs    06/12/18 0500 06/14/18 0513  VITAMINB12  --  1,118*  TIBC 168*  --   IRON 16*  --    Sepsis Labs: Recent Labs  Lab 06/10/18 2237  LATICACIDVEN 1.08    Recent Results (from the past 240 hour(s))  Culture, blood (Routine x 2)     Status: None (Preliminary result)   Collection Time: 06/10/18 11:25 PM  Result Value Ref Range Status   Specimen Description BLOOD LEFT ANTECUBITAL  Final   Special Requests   Final    BOTTLES DRAWN AEROBIC AND ANAEROBIC Blood Culture results may not be optimal due to an inadequate volume of blood received in culture  bottles   Culture   Final    NO GROWTH 3 DAYS Performed at [facility], [place] [address] [place], [place] [address]    Report Status PENDING  Incomplete  Culture, blood (Routine x 2)     Status: None (Preliminary result)   Collection Time: 06/10/18 11:39 PM  Result Value Ref Range Status   Specimen Description BLOOD LEFT ARM  Final   Special Requests   Final    BOTTLES DRAWN AEROBIC AND ANAEROBIC Blood Culture results may not be optimal due to an inadequate volume of  blood received in culture bottles   Culture   Final    NO GROWTH 3 DAYS Performed at [facility], [place] [address] [place], [place] [address]    Report Status PENDING  Incomplete         Radiology Studies: No results found.      Scheduled Meds: . allopurinol  100 mg Oral Daily  . aspirin EC  81 mg Oral Daily  . Chlorhexidine Gluconate Cloth  6 each Topical Q0600  . cinacalcet  90 mg Oral BID WC  . citalopram  10 mg Oral QHS  . colesevelam  625 mg Oral Q breakfast  . darbepoetin (ARANESP) injection - DIALYSIS  200 mcg Intravenous Q Thu-HD  . doxercalciferol  4 mcg Intravenous Q T,Th,Sa-HD  . ezetimibe  10 mg Oral Daily  . feeding supplement (PRO-STAT SUGAR FREE 64)  30 mL Oral BID  . ferric citrate  840 mg Oral TID WC  . gabapentin  300 mg Oral QHS  . insulin aspart  0-5 Units Subcutaneous QHS  . insulin aspart  0-9 Units Subcutaneous TID WC  . methimazole  2.5 mg Oral Daily  . midodrine  10 mg Oral Once per day on Tue Thu Sat  . multivitamin  1 tablet Oral QHS  . pantoprazole  40 mg Oral QPM  . saccharomyces boulardii  250 mg Oral BID  . zolpidem  5 mg Oral QHS   Continuous Infusions: . sodium chloride    . sodium chloride    . famotidine (PEPCID) IV 20 mg (06/14/18 0937)  . ferric gluconate (FERRLECIT/NULECIT) IV    . levofloxacin (LEVAQUIN) IV       LOS: 3 days    Time spent: 35 minutes.    Elmarie Shiley, MD [facility] Pager [phone]  If 7PM-7AM, please  contact night-coverage [email or web address] Password [email or web address] 06/14/2018, 1:53 PM

## 2018-06-14 NOTE — Progress Notes (Addendum)
[facility] Progress Note   Subjective:   Seen at bedside. States he is feeling a little better today.  Continues to have weakness and diarrhea, but slightly improved.   Objective Vitals:   06/13/18 1140 06/13/18 1200 06/13/18 1937 06/14/18 0627  BP: 113/62 111/77 116/62 (!) 98/47  Pulse: 85 66 86 78  Resp: 18 20 (!) 21 19  Temp: 97.6 F (36.4 C)  98.6 F (37 C) 98.2 F (36.8 C)  TempSrc: Oral  Oral Axillary  SpO2: 95% 100% 98% 95%  Weight:    100.2 kg  Height:       Physical Exam General:NAD, obese elderly male laying in bed wearing home CPAP. Heart:RRR, +6/6 systolic murmur Lungs:CTAB decreased bs Abdomen:soft, NTND, +BS Extremities:trace LE edema Dialysis Access: RU AVF +b/t   Filed Weights   06/13/18 0542 06/13/18 0818 06/14/18 4403  Weight: 101.6 kg 101.6 kg 100.2 kg    Intake/Output Summary (Last 24 hours) at 06/14/2018 0815 Last data filed at 06/13/2018 1800 Gross per 24 hour  Intake 580 ml  Output -  Net 580 ml    Additional Objective Labs: Basic Metabolic Panel: Recent Labs  Lab 06/10/18 2232 06/11/18 0403 06/14/18 0513  NA 138 139 137  K 4.6 4.4 4.3  CL 100 101 94*  CO2 25 25 24   GLUCOSE 148* 150* 153*  BUN 25* 31* 43*  CREATININE 7.22* 7.88* 8.99*  CALCIUM 7.7* 7.7* 8.7*  PHOS  --   --  7.0*   Liver Function Tests: Recent Labs  Lab 06/10/18 2232 06/11/18 0403 06/14/18 0513  AST 32 24  --   ALT 72* 61*  --   ALKPHOS 243* 220*  --   BILITOT 0.8 0.9  --   PROT 6.9 6.4*  --   ALBUMIN 2.9* 2.6* 2.3*   CBC: Recent Labs  Lab 06/10/18 2232 06/11/18 0403 06/11/18 1455 06/12/18 0509 06/13/18 0422 06/14/18 0513  WBC 11.2* 10.8* 12.5*  --  10.4 7.4  NEUTROABS 8.5*  --   --   --   --   --   HGB 8.6* 7.9* 9.2* 7.7* 7.7* 7.6*  HCT 27.5* 25.4* 29.8* 24.2* 24.5* 24.0*  MCV 99.6 100.4* 101.0*  --  97.6 96.8  PLT 266 278 294  --  306 265   Blood Culture    Component Value Date/Time   SDES BLOOD LEFT ARM 06/10/2018 2339    SPECREQUEST  06/10/2018 2339    BOTTLES DRAWN AEROBIC AND ANAEROBIC Blood Culture results may not be optimal due to an inadequate volume of blood received in culture bottles   CULT  06/10/2018 2339    NO GROWTH 2 DAYS Performed at [facility], [place] [address] [place], [place] [address]    REPTSTATUS PENDING 06/10/2018 2339    Cardiac Enzymes: Recent Labs  Lab 06/11/18 0403 06/12/18 0500  TROPONINI 0.04* 0.14*   CBG: Recent Labs  Lab 06/13/18 0759 06/13/18 1139 06/13/18 1708 06/13/18 2124 06/14/18 0742  GLUCAP 118* 135* 174* 180* 146*   Iron Studies:  Recent Labs    06/12/18 0500  IRON 16*  TIBC 168*   Lab Results  Component Value Date   INR 1.13 06/10/2018   INR 0.95 04/22/2017   INR 1.02 12/27/2016   Studies/Results: No results found.  Medications: . famotidine (PEPCID) IV 20 mg (06/12/18 1428)  . ferric gluconate (FERRLECIT/NULECIT) IV    . piperacillin-tazobactam (ZOSYN)  IV 3.375 g (06/13/18 2144)  . vancomycin Stopped (06/12/18  1341)   . allopurinol  100 mg Oral Daily  . aspirin EC  81 mg Oral Daily  . Chlorhexidine Gluconate Cloth  6 each Topical Q0600  . cinacalcet  90 mg Oral BID WC  . citalopram  10 mg Oral QHS  . colesevelam  625 mg Oral Q breakfast  . darbepoetin (ARANESP) injection - DIALYSIS  200 mcg Intravenous Q Thu-HD  . doxercalciferol  4 mcg Intravenous Q T,Th,Sa-HD  . ezetimibe  10 mg Oral Daily  . ferric citrate  420 mg Oral TID WC  . gabapentin  600 mg Oral QHS  . insulin aspart  0-5 Units Subcutaneous QHS  . insulin aspart  0-9 Units Subcutaneous TID WC  . methimazole  2.5 mg Oral Daily  . midodrine  10 mg Oral Once per day on Tue Thu Sat  . multivitamin  1 tablet Oral QHS  . pantoprazole  40 mg Oral QPM  . saccharomyces boulardii  250 mg Oral BID  . zolpidem  5 mg Oral QHS    Dialysis Orders: TTS at [facility] [place], BFR 350/800, EDW 102kg, 2K/2Ca, UF#2/lin, AVF, no heparin - Mircera 29mcg IV q 2 weeks (last 9/10,  Hgb 9.2 on 9/5, tsat 46% 8/22) - Hectoral 27mcg IV q HD  Assessment/Plan: 1. HCAP - CXR w/consolidation in RLL. on ABX. Afebrile last 24 hrs. BC negative. ? Viral, not clearly pneu 2. Diarrhea - ?antiobiotic induced. Improving. 3. ESRD - TTS schedule, plan for HD today. K 4.3.   4. Anemia of CKD- Hgb 7.6, stable. Aranesp 239mcg qwk (thurs). Fe load. Will transfuse if continues to trend down <7. EGD negative.  5. Secondary hyperparathyroidism - Ca in goal. Phos 7.0. ^ auryxia, sensipar and hectorol.  6. HTN/volume - BP soft. Not on antihypertensives.  Does not appear volume overloaded on exam.  Titrate down volume as tolerated.  7. Nutrition - Alb 2.3 Renal diet w/fluid restrictions. Renavite. Prostat 8. DMT2 w/neuropathy - controlled. Lowered Gabapentin dose to max appropriate dose for HD pt: 300mg  qd.  9. Obesity 10 Weakness this has set him back a lot, needs PT, not sure can be cared for at home  Jen Mow, PA-C [place] Pager: [phone] 06/14/2018,8:15 AM  LOS: 3 days  I have seen and examined this patient and agree with the plan of care seen, eval, examined, counseled. Jeneen Rinks Habiba Treloar 06/14/2018, 11:08 AM

## 2018-06-15 ENCOUNTER — Encounter (HOSPITAL_COMMUNITY): Payer: Self-pay | Admitting: Gastroenterology

## 2018-06-15 LAB — GLUCOSE, CAPILLARY
GLUCOSE-CAPILLARY: 124 mg/dL — AB (ref 70–99)
Glucose-Capillary: 135 mg/dL — ABNORMAL HIGH (ref 70–99)

## 2018-06-15 LAB — HEMOGLOBIN AND HEMATOCRIT, BLOOD
HEMATOCRIT: 24.9 % — AB (ref 39.0–52.0)
Hemoglobin: 7.9 g/dL — ABNORMAL LOW (ref 13.0–17.0)

## 2018-06-15 MED ORDER — PRO-STAT SUGAR FREE PO LIQD: 30.0000 mL | Freq: Two times a day (BID) | ORAL | 0 refills | Status: DC

## 2018-06-15 MED ORDER — SACCHAROMYCES BOULARDII 250 MG PO CAPS: 250.0000 mg | ORAL_CAPSULE | Freq: Two times a day (BID) | ORAL | 0 refills | Status: DC

## 2018-06-15 MED ORDER — MIDODRINE HCL 10 MG PO TABS: 10.0000 mg | ORAL_TABLET | ORAL | 0 refills | Status: DC

## 2018-06-15 MED ORDER — DOXERCALCIFEROL 4 MCG/2ML IV SOLN
INTRAVENOUS | Status: AC
  Filled 2018-06-15: qty 2

## 2018-06-15 NOTE — Progress Notes (Signed)
Report given to Hemodialysis RN and patient sent over via bed in stable condition with chart, ticket to ride and his Levaquin/Iron IV medications, CPAP set up upon arrival, care transferred to her.

## 2018-06-15 NOTE — Progress Notes (Signed)
Called Hemodialysis to speak with RN re: patient's time for dialysis and she said he probably wouldn't go till midnight or after, relayed that information to the patient. I also reviewed what meds to give now and what meds to wait for and she stated that the Levoquin and Iron could be given in dialysis and to give the Ambien on route to the dialysis unit. I asked the patient  If he was okay with waiting to take his Ambien and why and he was in agreement to wait, will continue to monitor and get him ready for dialysis when called.

## 2018-06-15 NOTE — Progress Notes (Addendum)
[facility] Progress Note   Subjective:   Seen at bedside. No new complaints.  Says he is going home today. Strength much better  Objective Vitals:   06/15/18 0430 06/15/18 0500 06/15/18 0530 06/15/18 0600  BP: 92/60 (!) 100/50 (!) 111/57 100/60  Pulse: 74 80 85 84  Resp: 18 20 18 20   Temp:    98.6 F (37 C)  TempSrc:    Oral  SpO2:    98%  Weight:    98 kg  Height:       Physical Exam General:NAD, obese, elderly male laying in bed wearing home CPAP Heart:RRR, +1/6 systolic murmur Lungs:CTAB Abdomen:soft, NTND, +BS Obese Extremities:trace LE edema Dialysis Access: RU AVF +b/t   Filed Weights   06/14/18 0627 06/15/18 0150 06/15/18 0600  Weight: 100.2 kg 100.2 kg 98 kg    Intake/Output Summary (Last 24 hours) at 06/15/2018 0830 Last data filed at 06/15/2018 0600 Gross per 24 hour  Intake 270 ml  Output 2500 ml  Net -2230 ml    Additional Objective Labs: Basic Metabolic Panel: Recent Labs  Lab 06/10/18 2232 06/11/18 0403 06/14/18 0513  NA 138 139 137  K 4.6 4.4 4.3  CL 100 101 94*  CO2 25 25 24   GLUCOSE 148* 150* 153*  BUN 25* 31* 43*  CREATININE 7.22* 7.88* 8.99*  CALCIUM 7.7* 7.7* 8.7*  PHOS  --   --  7.0*   Liver Function Tests: Recent Labs  Lab 06/10/18 2232 06/11/18 0403 06/14/18 0513  AST 32 24  --   ALT 72* 61*  --   ALKPHOS 243* 220*  --   BILITOT 0.8 0.9  --   PROT 6.9 6.4*  --   ALBUMIN 2.9* 2.6* 2.3*   CBC: Recent Labs  Lab 06/10/18 2232 06/11/18 0403 06/11/18 1455 06/12/18 0509 06/13/18 0422 06/14/18 0513  WBC 11.2* 10.8* 12.5*  --  10.4 7.4  NEUTROABS 8.5*  --   --   --   --   --   HGB 8.6* 7.9* 9.2* 7.7* 7.7* 7.6*  HCT 27.5* 25.4* 29.8* 24.2* 24.5* 24.0*  MCV 99.6 100.4* 101.0*  --  97.6 96.8  PLT 266 278 294  --  306 265    Cardiac Enzymes: Recent Labs  Lab 06/11/18 0403 06/12/18 0500  TROPONINI 0.04* 0.14*   CBG: Recent Labs  Lab 06/14/18 0742 06/14/18 1145 06/14/18 1601 06/14/18 2200  06/15/18 0752  GLUCAP 146* 150* 130* 207* 135*   Iron Studies: No results for input(s): IRON, TIBC, TRANSFERRIN, FERRITIN in the last 72 hours. Lab Results  Component Value Date   INR 1.13 06/10/2018   INR 0.95 04/22/2017   INR 1.02 12/27/2016   Studies/Results: No results found.  Medications: . famotidine (PEPCID) IV Stopped (06/14/18 1015)  . ferric gluconate (FERRLECIT/NULECIT) IV 125 mg (06/15/18 0513)   . allopurinol  100 mg Oral Daily  . aspirin EC  81 mg Oral Daily  . Chlorhexidine Gluconate Cloth  6 each Topical Q0600  . cinacalcet  90 mg Oral BID WC  . citalopram  10 mg Oral QHS  . colesevelam  625 mg Oral Q breakfast  . darbepoetin (ARANESP) injection - DIALYSIS  200 mcg Intravenous Q Thu-HD  . doxercalciferol  4 mcg Intravenous Q T,Th,Sa-HD  . ezetimibe  10 mg Oral Daily  . feeding supplement (PRO-STAT SUGAR FREE 64)  30 mL Oral BID  . ferric citrate  840 mg Oral TID WC  . gabapentin  300  mg Oral QHS  . insulin aspart  0-5 Units Subcutaneous QHS  . insulin aspart  0-9 Units Subcutaneous TID WC  . methimazole  2.5 mg Oral Daily  . midodrine  10 mg Oral Once per day on Tue Thu Sat  . multivitamin  1 tablet Oral QHS  . pantoprazole  40 mg Oral QPM  . saccharomyces boulardii  250 mg Oral BID  . zolpidem  5 mg Oral QHS    Dialysis Orders: TTS at [place], BFR 350/800, EDW 102kg, 2K/2Ca, UF#2/lin, AVF, no heparin - Mircera 80mcg IV q 2 weeks (last 9/10, Hgb 9.2 on 9/5, tsat 46% 8/22) - Hectoral 76mcg IV q HD  Assessment/Plan: 1. HCAP - CXR w/consolidation in RLL. on ABX. Afebrile. BC negative. ? Viral, not clearly pneu NOT clear what fever was from ??viral 2. Diarrhea - ?antiobiotic induced. Improving. 3. ESRD - TTS schedule. HD delayed until early this AM.  Net UF removed 2.5L. K 4.3.   4. Anemia of CKD- Hgb stable at 7.6. Aranesp 253mcg qwk (thurs). Fe load. Will transfuse if continues to trend down <7. EGD negative.  5. Secondary hyperparathyroidism  - Ca in goal. Phos 7.0. auryxia^4tab TID, continue sensipar and hectorol.  6. HTN/volume - BP soft. Not on antihypertensives.  Appear euvolemic on exam. Titrate down volume as tolerated. Low bps , on [place] 7. Nutrition - Alb 2.3 Renal diet w/fluid restrictions. Renavite. Prostat 8. DMT2 w/neuropathy - controlled. Lowered Gabapentin dose to max appropriate dose for HD pt: 300mg  qd.  9. Obesity 10 Weakness this has set him back a lot, needs PT, not sure can be cared for at home - PT eval recommends SNF Needs PT  Jen Mow, PA-C [facility] Pager: [phone] 06/15/2018,8:30 AM I have seen and examined this patient and agree with the plan of care seen, eval, examined, counseled, discussed with PA .  Jeneen Rinks Beverlyann Broxterman 06/15/2018, 10:50 AM   LOS: 4 days

## 2018-06-15 NOTE — Discharge Summary (Signed)
Physician Discharge Summary  [name] OMV:[id number] DOB: 15-Oct-1950 DOA: 06/10/2018  PCP: Biagio Borg, MD  Admit date: 06/10/2018 Discharge date: 06/15/2018  Admitted From: Home  Disposition:  Home   Recommendations for Outpatient Follow-up:  1. Follow up with PCP in 1-2 weeks 2. Please obtain BMP/CBC in one week 3. Needs follow up hb level.   Home Health: yes  Discharge Condition: stable.  CODE STATUS: full code.  Diet recommendation: Heart Healthy  Brief/Interim Summary: Brief Narrative: 67 year old male with history of ESRD on hemodialysis (T, T, SAT), CAD status post stenting, AVNRT status post ablation, chronic combined systolic and diastolic CHF (EF of 62-83% in 2018), chronic anemia, gout, hyperthyroidism presented to the ED with fever and chills since then night prior to admission. Patient had visual hemodialysis following which she had small amount of bleeding from the AV fistula site which was controlled with pressure. After returning home he started having fevers with chills and increasing shortness of breath. In the ED he was febrile with temperature of 102 F with chest x-ray showing infiltrates. Blood cultures ordered and patient placed on empiric antibiotics. He was also found to have drop in about 3 g of hemoglobin from last month with positive stool for Hemoccult. He has history of GI bleed from radiation proctitis and had sigmoidoscopy in October 2018 showing ulceration in the rectum. Patient denied any bright red blood per rectum or melena at this time.    Assessment & Plan:   Principal Problem:   HCAP (healthcare-associated pneumonia) Active Problems:   Ischemic cardiomyopathy   Anemia associated with acute blood loss   AVM (arteriovenous malformation)   Hyperthyroidism   Malignant neoplasm of prostate ([name])   DM (diabetes mellitus) type II controlled with renal manifestation ([name])   PAD (peripheral artery disease) ([name])   ESRD on hemodialysis  ([place])   Essential hypertension   GI bleed  HCAP;  Presents with cough, fever. Chest x ray with infiltrates.  Blood culture negative.  Day 4 IV antibiotics. Received a dose of levaquin IV 9-14, that will complete course of antibiotics.   Anemia, associated with acute blood loss anemia;  endoscopy negative.  Hb stable at 7.7.---7.6 -7.9 improving.  Getting IV iron.   Ischemic cardiomyopathy;  Volume manage with HD.   Hyperthyroidism on tapazole.   Malignant neoplasm of prostate ([place]) Status post radiation  Mild elevation of troponin; demand.   ERSD; on HD. Nephrology following.   DM; SSI.  Deconditioning;  PT rec SNf,  Social work consult.  Patient wishes to go home, he feels better. Will arrange HH.   Discharge Diagnoses:  Principal Problem:   HCAP (healthcare-associated pneumonia) Active Problems:   Ischemic cardiomyopathy   Anemia associated with acute blood loss   AVM (arteriovenous malformation)   Hyperthyroidism   Malignant neoplasm of prostate ([name])   DM (diabetes mellitus) type II controlled with renal manifestation ([name])   PAD (peripheral artery disease) ([place])   ESRD on hemodialysis ([place])   Essential hypertension   GI bleed    Discharge Instructions  Discharge Instructions    Diet - low sodium heart healthy   Complete by:  As directed    Increase activity slowly   Complete by:  As directed      Allergies as of 06/15/2018      Reactions   Cephalexin Swelling, Other (See Comments)   Tongue swelling, but no breathing issues   Statins Other (See Comments)   Weak muscles   Ciprofloxacin  Rash      Medication List    TAKE these medications   acetaminophen 325 MG tablet Commonly known as:  TYLENOL Take 650 mg by mouth every 6 (six) hours as needed for mild pain or headache.   Alirocumab 150 MG/ML Sopn Inject 150 mg into the skin every 14 (fourteen) days.   allopurinol 100 MG tablet Commonly known as:  ZYLOPRIM TAKE ONE TABLET BY  MOUTH ONCE DAILY   aspirin EC 81 MG tablet Take 81 mg by mouth daily.   AURYXIA 1 GM 210 MG(Fe) tablet Generic drug:  ferric citrate Take 210-420 mg by mouth See admin instructions. Take 420 mg by mouth two times a day and 210 mg with each snack   betamethasone dipropionate 0.05 % cream Commonly known as:  DIPROLENE Apply 1 application topically daily as needed (leg sores).   citalopram 10 MG tablet Commonly known as:  CELEXA Take 1 tablet (10 mg total) by mouth at bedtime.   ezetimibe 10 MG tablet Commonly known as:  ZETIA TAKE ONE TABLET BY MOUTH ONCE DAILY   feeding supplement (PRO-STAT SUGAR FREE 64) Liqd Take 30 mLs by mouth 2 (two) times daily.   gabapentin 300 MG capsule Commonly known as:  NEURONTIN Take 1 capsule (300 mg total) by mouth at bedtime. What changed:  how much to take   hydroxypropyl methylcellulose / hypromellose 2.5 % ophthalmic solution Commonly known as:  ISOPTO TEARS / GONIOVISC Place 1 drop into both eyes 3 (three) times daily as needed for dry eyes.   methimazole 5 MG tablet Commonly known as:  TAPAZOLE Take 0.5 tablets (2.5 mg total) by mouth daily.   midodrine 10 MG tablet Commonly known as:  PROAMATINE Take 1 tablet (10 mg total) by mouth See admin instructions. Once a day only on dialysis days (Tues/Thurs/Sat) What changed:  additional instructions   multivitamin Tabs tablet Take 1 tablet by mouth daily. Reported on 10/24/2015   pantoprazole 40 MG tablet Commonly known as:  PROTONIX Take 40 mg by mouth every evening.   polyethylene glycol packet Commonly known as:  MIRALAX / GLYCOLAX Take 17 g by mouth daily. What changed:    when to take this  reasons to take this   saccharomyces boulardii 250 MG capsule Commonly known as:  FLORASTOR Take 1 capsule (250 mg total) by mouth 2 (two) times daily.   SENSIPAR 90 MG tablet Generic drug:  cinacalcet Take 90 mg by mouth 2 (two) times daily.   WELCHOL 625 MG tablet Generic drug:   colesevelam TAKE THREE TABLETS BY MOUTH TWICE DAILY WITH MEALS   zolpidem 5 MG tablet Commonly known as:  AMBIEN TAKE 1 TABLET BY MOUTH AT BEDTIME AS NEEDED FOR SLEEP What changed:    when to take this  additional instructions       Allergies  Allergen Reactions  . Cephalexin Swelling and Other (See Comments)    Tongue swelling, but no breathing issues  . Statins Other (See Comments)    Weak muscles  . Ciprofloxacin Rash    Consultations: Nephrology   Procedures/Studies: Dg Chest 2 View  Result Date: 06/10/2018 CLINICAL DATA:  Shortness of breath. EXAM: CHEST - 2 VIEW COMPARISON:  Radiograph 01/15/2018 FINDINGS: Patchy and confluent consolidation in the right lower lobe, new from prior exam. Upper normal heart size normal mediastinal contours. Aortic atherosclerosis. No pulmonary edema, pleural effusion or pneumothorax. Vascular stent in the right axilla. Degenerative change of the shoulders. IMPRESSION: Patchy and confluent consolidation in  the right lower lobe, suspicious for pneumonia. Followup PA and lateral chest X-ray is recommended in 3-4 weeks following trial of antibiotic therapy to ensure resolution and exclude underlying malignancy. Electronically Signed   By: Keith Rake M.D.   On: 06/10/2018 23:17   Ct Head Wo Contrast  Result Date: 06/11/2018 CLINICAL DATA:  Altered mental status. Fall getting up from recliner. EXAM: CT HEAD WITHOUT CONTRAST TECHNIQUE: Contiguous axial images were obtained from the base of the skull through the vertex without intravenous contrast. COMPARISON:  Head CT 10/16/2016 FINDINGS: Brain: Age related atrophy. Mild chronic small vessel ischemia. No intracranial hemorrhage, mass effect, or midline shift. No hydrocephalus. The basilar cisterns are patent. No evidence of territorial infarct or acute ischemia. No extra-axial or intracranial fluid collection. Vascular: Atherosclerosis of skullbase vasculature without hyperdense vessel or  abnormal calcification. Skull: No fracture or focal lesion. Sinuses/Orbits: Paranasal sinuses and mastoid air cells are clear. The visualized orbits are unremarkable. Other: None. IMPRESSION: No acute intracranial abnormality. Electronically Signed   By: Keith Rake M.D.   On: 06/11/2018 01:31      Subjective: He is feeling better. Wants to go home   Discharge Exam: Vitals:   06/15/18 0530 06/15/18 0600  BP: (!) 111/57 100/60  Pulse: 85 84  Resp: 18 20  Temp:  98.6 F (37 C)  SpO2:  98%   Vitals:   06/15/18 0430 06/15/18 0500 06/15/18 0530 06/15/18 0600  BP: 92/60 (!) 100/50 (!) 111/57 100/60  Pulse: 74 80 85 84  Resp: 18 20 18 20   Temp:    98.6 F (37 C)  TempSrc:    Oral  SpO2:    98%  Weight:    98 kg  Height:        General: Pt is alert, awake, not in acute distress Cardiovascular: RRR, S1/S2 +, no rubs, no gallops Respiratory: CTA bilaterally, no wheezing, no rhonchi Abdominal: Soft, NT, ND, bowel sounds + Extremities: no edema, no cyanosis    The results of significant diagnostics from this hospitalization (including imaging, microbiology, ancillary and laboratory) are listed below for reference.     Microbiology: Recent Results (from the past 240 hour(s))  Culture, blood (Routine x 2)     Status: None (Preliminary result)   Collection Time: 06/10/18 11:25 PM  Result Value Ref Range Status   Specimen Description BLOOD LEFT ANTECUBITAL  Final   Special Requests   Final    BOTTLES DRAWN AEROBIC AND ANAEROBIC Blood Culture results may not be optimal due to an inadequate volume of blood received in culture bottles   Culture   Final    NO GROWTH 3 DAYS Performed at [facility], [place] [address] [place], [place] [address]    Report Status PENDING  Incomplete  Culture, blood (Routine x 2)     Status: None (Preliminary result)   Collection Time: 06/10/18 11:39 PM  Result Value Ref Range Status   Specimen Description BLOOD LEFT ARM  Final   Special  Requests   Final    BOTTLES DRAWN AEROBIC AND ANAEROBIC Blood Culture results may not be optimal due to an inadequate volume of blood received in culture bottles   Culture   Final    NO GROWTH 3 DAYS Performed at [facility], [place] [address], [place], [place] [address]    Report Status PENDING  Incomplete     Labs: BNP (last 3 results) No results for input(s): BNP in the last 8760 hours.  Basic Metabolic Panel: Recent Labs  Lab 06/10/18 2232 06/11/18 0403 06/14/18 0513  NA 138 139 137  K 4.6 4.4 4.3  CL 100 101 94*  CO2 25 25 24   GLUCOSE 148* 150* 153*  BUN 25* 31* 43*  CREATININE 7.22* 7.88* 8.99*  CALCIUM 7.7* 7.7* 8.7*  PHOS  --   --  7.0*   Liver Function Tests: Recent Labs  Lab 06/10/18 2232 06/11/18 0403 06/14/18 0513  AST 32 24  --   ALT 72* 61*  --   ALKPHOS 243* 220*  --   BILITOT 0.8 0.9  --   PROT 6.9 6.4*  --   ALBUMIN 2.9* 2.6* 2.3*   No results for input(s): LIPASE, AMYLASE in the last 168 hours. No results for input(s): AMMONIA in the last 168 hours. CBC: Recent Labs  Lab 06/10/18 2232 06/11/18 0403 06/11/18 1455 06/12/18 0509 06/13/18 0422 06/14/18 0513 06/15/18 0811  WBC 11.2* 10.8* 12.5*  --  10.4 7.4  --   NEUTROABS 8.5*  --   --   --   --   --   --   HGB 8.6* 7.9* 9.2* 7.7* 7.7* 7.6* 7.9*  HCT 27.5* 25.4* 29.8* 24.2* 24.5* 24.0* 24.9*  MCV 99.6 100.4* 101.0*  --  97.6 96.8  --   PLT 266 278 294  --  306 265  --    Cardiac Enzymes: Recent Labs  Lab 06/11/18 0403 06/12/18 0500  TROPONINI 0.04* 0.14*   BNP: Invalid input(s): POCBNP CBG: Recent Labs  Lab 06/14/18 0742 06/14/18 1145 06/14/18 1601 06/14/18 2200 06/15/18 0752  GLUCAP 146* 150* 130* 207* 135*   D-Dimer No results for input(s): DDIMER in the last 72 hours. Hgb A1c No results for input(s): HGBA1C in the last 72 hours. Lipid Profile No results for input(s): CHOL, HDL, LDLCALC, TRIG, CHOLHDL, LDLDIRECT in the last 72 hours. Thyroid function studies No  results for input(s): TSH, T4TOTAL, T3FREE, THYROIDAB in the last 72 hours.  Invalid input(s): FREET3 Anemia work up Recent Labs    06/14/18 0513  VITAMINB12 1,118*   Urinalysis    Component Value Date/Time   COLORURINE LT. YELLOW 06/26/2013 1200   APPEARANCEUR CLEAR 06/26/2013 1200   LABSPEC 1.020 06/26/2013 1200   PHURINE 8.5 06/26/2013 1200   GLUCOSEU 100 06/26/2013 1200   HGBUR MODERATE 06/26/2013 1200   BILIRUBINUR NEGATIVE 06/26/2013 1200   KETONESUR NEGATIVE 06/26/2013 1200   PROTEINUR >300 (A) 01/04/2010 1032   UROBILINOGEN 0.2 06/26/2013 1200   NITRITE NEGATIVE 06/26/2013 1200   LEUKOCYTESUR TRACE 06/26/2013 1200   Sepsis Labs Invalid input(s): PROCALCITONIN,  WBC,  LACTICIDVEN Microbiology Recent Results (from the past 240 hour(s))  Culture, blood (Routine x 2)     Status: None (Preliminary result)   Collection Time: 06/10/18 11:25 PM  Result Value Ref Range Status   Specimen Description BLOOD LEFT ANTECUBITAL  Final   Special Requests   Final    BOTTLES DRAWN AEROBIC AND ANAEROBIC Blood Culture results may not be optimal due to an inadequate volume of blood received in culture bottles   Culture   Final    NO GROWTH 3 DAYS Performed at [facility], [place] [address] [place], [place] [address]    Report Status PENDING  Incomplete  Culture, blood (Routine x 2)     Status: None (Preliminary result)   Collection Time: 06/10/18 11:39 PM  Result Value Ref Range Status   Specimen Description BLOOD LEFT ARM  Final  Special Requests   Final    BOTTLES DRAWN AEROBIC AND ANAEROBIC Blood Culture results may not be optimal due to an inadequate volume of blood received in culture bottles   Culture   Final    NO GROWTH 3 DAYS Performed at [facility], [place] [address], [place], [place] [address]    Report Status PENDING  Incomplete     Time coordinating discharge: 35 minutes  SIGNED:   Elmarie Shiley, MD  [facility] 06/15/2018, 10:21  AM Pager   If 7PM-7AM, please contact night-coverage [email or web address] Password [email or web address]

## 2018-06-15 NOTE — Progress Notes (Addendum)
Patient was found in his room and when I came in to see if he was on the monitor, was told by the patient that he had been in his room for 15 minutes, no report called prior to patient's arrival. VSS and patient denies pain at this time, central monitor called and battery replaced on his monitor at this time. Nurse Maylon Peppers took care of the patient during his dialysis.

## 2018-06-15 NOTE — Social Work (Signed)
CSW acknowledging that pt is refusing SNF.  Recommendations are for [facility] therapies.   CSW signing off. Please consult if any additional needs arise.  Alexander Mt, [place] Work [phone]

## 2018-06-15 NOTE — Care Management Note (Signed)
Case Management Note  Patient Details  Name: [name] MRN: [id number] Date of Birth: 06/19/1951  Subjective/Objective:       Pt from home with wife for HCAP.  Pt to d/c home today.  Offered HH agency choice.  Pt reports has been using [facility] and would like to continue with them.  Unable to determine if currently active.            Action/Plan: Graciella Freer, liaison with [facility] given referral.  Information placed on AVS.  Expected Discharge Date:  06/15/18               Expected Discharge Plan:  [place]  In-House Referral:  NA  Discharge planning Services  CM Consult  Post Acute Care Choice:  Home Health Choice offered to:  Patient, Spouse  DME Arranged:  N/A DME Agency:  NA  HH Arranged:  RN, PT, OT, Nurse's Aide [place] Agency:  [facility] (formerly [facility]  Status of Service:  Completed, signed off  If discussed at [employer], dates discussed:    Additional Comments:  Claudie Leach, RN 06/15/2018, 10:56 AM

## 2018-06-16 ENCOUNTER — Telehealth: Payer: Self-pay | Admitting: Internal Medicine

## 2018-06-16 LAB — CULTURE, BLOOD (ROUTINE X 2)
CULTURE: NO GROWTH
Culture: NO GROWTH

## 2018-06-16 LAB — VITAMIN D 25 HYDROXY (VIT D DEFICIENCY, FRACTURES): VIT D 25 HYDROXY: 12.4 ng/mL — AB (ref 30.0–100.0)

## 2018-06-16 NOTE — Telephone Encounter (Signed)
Transition Care Management Follow-up Telephone Call  How have you been since you were released from the hospital? Patient still feels weak and tired.   Do you understand why you were in the hospital? yes   Do you understand the discharge instrcutions? yes  Items Reviewed:  Medications reviewed: yes  Allergies reviewed: yes  Dietary changes reviewed: yes  Referrals reviewed: yes   Functional Questionnaire:   Activities of Daily Living (ADLs):   He states they are independent in the following: ambulation, feeding, continence, grooming, toileting and dressing States they require assistance with the following: bathing and hygiene,    Any transportation issues/concerns?: no   Any patient concerns? no   Confirmed importance and date/time of follow-up visits scheduled: yes   Confirmed with patient if condition begins to worsen call PCP or go to the ER.  Patient was given the Call-a-Nurse line [phone]: yes

## 2018-06-17 DIAGNOSIS — E119 Type 2 diabetes mellitus without complications: Secondary | ICD-10-CM | POA: Diagnosis not present

## 2018-06-17 DIAGNOSIS — Z23 Encounter for immunization: Secondary | ICD-10-CM | POA: Diagnosis not present

## 2018-06-17 DIAGNOSIS — D631 Anemia in chronic kidney disease: Secondary | ICD-10-CM | POA: Diagnosis not present

## 2018-06-17 DIAGNOSIS — N186 End stage renal disease: Secondary | ICD-10-CM | POA: Diagnosis not present

## 2018-06-17 DIAGNOSIS — N2581 Secondary hyperparathyroidism of renal origin: Secondary | ICD-10-CM | POA: Diagnosis not present

## 2018-06-18 DIAGNOSIS — M48062 Spinal stenosis, lumbar region with neurogenic claudication: Secondary | ICD-10-CM | POA: Diagnosis not present

## 2018-06-18 DIAGNOSIS — I2510 Atherosclerotic heart disease of native coronary artery without angina pectoris: Secondary | ICD-10-CM | POA: Diagnosis not present

## 2018-06-18 DIAGNOSIS — J449 Chronic obstructive pulmonary disease, unspecified: Secondary | ICD-10-CM | POA: Diagnosis not present

## 2018-06-18 DIAGNOSIS — I5042 Chronic combined systolic (congestive) and diastolic (congestive) heart failure: Secondary | ICD-10-CM | POA: Diagnosis not present

## 2018-06-18 DIAGNOSIS — I959 Hypotension, unspecified: Secondary | ICD-10-CM | POA: Diagnosis not present

## 2018-06-18 DIAGNOSIS — K222 Esophageal obstruction: Secondary | ICD-10-CM | POA: Diagnosis not present

## 2018-06-18 DIAGNOSIS — N400 Enlarged prostate without lower urinary tract symptoms: Secondary | ICD-10-CM | POA: Diagnosis not present

## 2018-06-18 DIAGNOSIS — Z96659 Presence of unspecified artificial knee joint: Secondary | ICD-10-CM | POA: Diagnosis not present

## 2018-06-18 DIAGNOSIS — F0280 Dementia in other diseases classified elsewhere without behavioral disturbance: Secondary | ICD-10-CM | POA: Diagnosis not present

## 2018-06-18 DIAGNOSIS — M5412 Radiculopathy, cervical region: Secondary | ICD-10-CM | POA: Diagnosis not present

## 2018-06-18 DIAGNOSIS — D631 Anemia in chronic kidney disease: Secondary | ICD-10-CM | POA: Diagnosis not present

## 2018-06-18 DIAGNOSIS — I132 Hypertensive heart and chronic kidney disease with heart failure and with stage 5 chronic kidney disease, or end stage renal disease: Secondary | ICD-10-CM | POA: Diagnosis not present

## 2018-06-18 DIAGNOSIS — Z992 Dependence on renal dialysis: Secondary | ICD-10-CM | POA: Diagnosis not present

## 2018-06-18 DIAGNOSIS — I252 Old myocardial infarction: Secondary | ICD-10-CM | POA: Diagnosis not present

## 2018-06-18 DIAGNOSIS — E1142 Type 2 diabetes mellitus with diabetic polyneuropathy: Secondary | ICD-10-CM | POA: Diagnosis not present

## 2018-06-18 DIAGNOSIS — E1151 Type 2 diabetes mellitus with diabetic peripheral angiopathy without gangrene: Secondary | ICD-10-CM | POA: Diagnosis not present

## 2018-06-18 DIAGNOSIS — N186 End stage renal disease: Secondary | ICD-10-CM | POA: Diagnosis not present

## 2018-06-18 DIAGNOSIS — I255 Ischemic cardiomyopathy: Secondary | ICD-10-CM | POA: Diagnosis not present

## 2018-06-18 DIAGNOSIS — E039 Hypothyroidism, unspecified: Secondary | ICD-10-CM | POA: Diagnosis not present

## 2018-06-18 DIAGNOSIS — F329 Major depressive disorder, single episode, unspecified: Secondary | ICD-10-CM | POA: Diagnosis not present

## 2018-06-18 DIAGNOSIS — M109 Gout, unspecified: Secondary | ICD-10-CM | POA: Diagnosis not present

## 2018-06-18 DIAGNOSIS — G4733 Obstructive sleep apnea (adult) (pediatric): Secondary | ICD-10-CM | POA: Diagnosis not present

## 2018-06-18 DIAGNOSIS — B351 Tinea unguium: Secondary | ICD-10-CM | POA: Diagnosis not present

## 2018-06-18 DIAGNOSIS — R1310 Dysphagia, unspecified: Secondary | ICD-10-CM | POA: Diagnosis not present

## 2018-06-18 DIAGNOSIS — E1122 Type 2 diabetes mellitus with diabetic chronic kidney disease: Secondary | ICD-10-CM | POA: Diagnosis not present

## 2018-06-19 ENCOUNTER — Telehealth: Payer: Self-pay

## 2018-06-19 DIAGNOSIS — E119 Type 2 diabetes mellitus without complications: Secondary | ICD-10-CM | POA: Diagnosis not present

## 2018-06-19 DIAGNOSIS — N186 End stage renal disease: Secondary | ICD-10-CM | POA: Diagnosis not present

## 2018-06-19 DIAGNOSIS — D631 Anemia in chronic kidney disease: Secondary | ICD-10-CM | POA: Diagnosis not present

## 2018-06-19 DIAGNOSIS — N2581 Secondary hyperparathyroidism of renal origin: Secondary | ICD-10-CM | POA: Diagnosis not present

## 2018-06-19 DIAGNOSIS — Z23 Encounter for immunization: Secondary | ICD-10-CM | POA: Diagnosis not present

## 2018-06-19 NOTE — Telephone Encounter (Signed)
Verbal orders given     Copied from [place] [phone]. Topic: Quick Communication - See Telephone Encounter >> Jun 19, 2018  8:49 AM Antonieta Iba C wrote: CRM for notification. See Telephone encounter for: 06/19/18.  [name] w/ [facility] called in to request vo for 2 times for 4 weeks for PT.   CB: [phone]

## 2018-06-20 ENCOUNTER — Ambulatory Visit (INDEPENDENT_AMBULATORY_CARE_PROVIDER_SITE_OTHER): Payer: Medicare Other | Admitting: Internal Medicine

## 2018-06-20 ENCOUNTER — Other Ambulatory Visit (INDEPENDENT_AMBULATORY_CARE_PROVIDER_SITE_OTHER): Payer: Medicare Other

## 2018-06-20 ENCOUNTER — Encounter: Payer: Self-pay | Admitting: Internal Medicine

## 2018-06-20 VITALS — BP 116/78 | HR 103 | Temp 98.3°F | Ht 73.0 in | Wt 228.0 lb

## 2018-06-20 DIAGNOSIS — D649 Anemia, unspecified: Secondary | ICD-10-CM

## 2018-06-20 DIAGNOSIS — Z8701 Personal history of pneumonia (recurrent): Secondary | ICD-10-CM

## 2018-06-20 DIAGNOSIS — H1033 Unspecified acute conjunctivitis, bilateral: Secondary | ICD-10-CM

## 2018-06-20 DIAGNOSIS — B370 Candidal stomatitis: Secondary | ICD-10-CM

## 2018-06-20 DIAGNOSIS — K625 Hemorrhage of anus and rectum: Secondary | ICD-10-CM | POA: Diagnosis not present

## 2018-06-20 DIAGNOSIS — J189 Pneumonia, unspecified organism: Secondary | ICD-10-CM

## 2018-06-20 LAB — CBC WITH DIFFERENTIAL/PLATELET
BASOS ABS: 0.1 10*3/uL (ref 0.0–0.1)
BASOS PCT: 1.5 % (ref 0.0–3.0)
EOS ABS: 0.2 10*3/uL (ref 0.0–0.7)
Eosinophils Relative: 1.9 % (ref 0.0–5.0)
HEMATOCRIT: 30.8 % — AB (ref 39.0–52.0)
HEMOGLOBIN: 9.9 g/dL — AB (ref 13.0–17.0)
LYMPHS PCT: 19.5 % (ref 12.0–46.0)
Lymphs Abs: 1.7 10*3/uL (ref 0.7–4.0)
MCHC: 32.2 g/dL (ref 30.0–36.0)
MCV: 96.8 fl (ref 78.0–100.0)
MONO ABS: 1 10*3/uL (ref 0.1–1.0)
Monocytes Relative: 11.6 % (ref 3.0–12.0)
Neutro Abs: 5.7 10*3/uL (ref 1.4–7.7)
Neutrophils Relative %: 65.5 % (ref 43.0–77.0)
Platelets: 458 10*3/uL — ABNORMAL HIGH (ref 150.0–400.0)
RBC: 3.18 Mil/uL — AB (ref 4.22–5.81)
RDW: 18.2 % — AB (ref 11.5–15.5)
WBC: 8.7 10*3/uL (ref 4.0–10.5)

## 2018-06-20 MED ORDER — NYSTATIN 100000 UNIT/ML MT SUSP: 500000.0000 [IU] | Freq: Four times a day (QID) | OROMUCOSAL | 0 refills | Status: AC

## 2018-06-20 MED ORDER — ZOLPIDEM TARTRATE 5 MG PO TABS: 5.0000 mg | ORAL_TABLET | Freq: Every evening | ORAL | 1 refills | Status: DC | PRN

## 2018-06-20 MED ORDER — ERYTHROMYCIN 5 MG/GM OP OINT: 1.0000 "application " | TOPICAL_OINTMENT | Freq: Four times a day (QID) | OPHTHALMIC | 0 refills | Status: AC

## 2018-06-20 NOTE — Patient Instructions (Addendum)
Please take all new medication as prescribed - the antibiotic ointment for the eyes, as well as the solution mouth treatment for thrush  Please continue all other medications as before, and refills have been done if requested  - the ambien  Please have the pharmacy call with any other refills you may need.  Please keep your appointments with your specialists as you may have planned  Please go to the LAB in the Basement (turn left off the elevator) for the tests to be done today  You will be contacted by phone if any changes need to be made immediately.  Otherwise, you will receive a letter about your results with an explanation, but please check with MyChart first.  Please remember to sign up for MyChart if you have not done so, as this will be important to you in the future with finding out test results, communicating by private email, and scheduling acute appointments online when needed.  Please return in 6 months, or sooner if needed

## 2018-06-20 NOTE — Progress Notes (Signed)
Subjective:    Patient ID: [name], male    DOB: 03/14/1951, 67 y.o.   MRN: [id number]  HPI  Here with history of ESRD on hemodialysis (T, T, SAT), CAD status post stenting, AVNRT status post ablation, chronic combined systolic and diastolic CHF (EF of 95-63% in 2018), chronic anemia, gout, hyperthyroidism presented to the ED with fever and chills since then night prior to admission. Patient had hemodialysis following which he had small amount of bleeding from the AV fistula site which was controlled with pressure. After returning home he started having fevers with chills and increasing shortness of breath. In the ED he was febrile with temperature of 102 F with chest x-ray showing infiltrates. He was also found to have drop in about 3 g of hemoglobin from last month with positive stool for Hemoccult. He has history of GI bleed from radiation proctitis and had sigmoidoscopy in October 2018 showing ulceration in the rectum. Today states still feels fatigue post d/c after finished antibx. C/o generalized weakness but PT just now getting started.  Also has bilateral eye redness and discomfort for 2 days with some weepiness, and new tongue coating with discomfort.   Past Medical History:  Diagnosis Date  . Allergic rhinitis, cause unspecified 02/24/2014  . Anemia 06/16/2011  . BENIGN PROSTATIC HYPERTROPHY 10/14/2009  . CAD, NATIVE VESSEL 02/06/2009   a. 06/2007 s/p Taxus DES to the RCA;  b. 08/2016 NSTEMI in setting of SVT/PCI: LM 30ost, LAD 81m (3.0x16 Synergy DES), LCX 65m, OM1 60, RI 40, RCA 70p/m, 7m - not amenable to PCI.  [name] Cervical radiculopathy, chronic 02/23/2016   Right c5-6 by NCS/EMG  . Chronic combined systolic (congestive) and diastolic (congestive) heart failure ([place])    a. 10/2016 Echo: EF 40-45%, Gr2 DD. mildly dil LA.  [name] COLONIC POLYPS, HX OF 10/14/2009  . Dementia [id number]  . Depression 09/24/2015  . DIABETES MELLITUS, TYPE II 02/01/2010  . DYSLIPIDEMIA 06/18/2007  . ESRD  (end stage renal disease) on dialysis ([place]) 08/04/2010   "TTS;  " (04/18/2015)  . FOOT PAIN 08/12/2008  . GAIT DISTURBANCE 03/03/2010  . GASTROENTERITIS, VIRAL 10/14/2009  . GERD 06/18/2007  . GOITER, MULTINODULAR 12/26/2007  . GOUT 06/18/2007  . GYNECOMASTIA 07/17/2010  . Hemodialysis access, fistula mature [facility])    Dialysis T-Th-Sa ([place]) Right upper arm fistula  . Hyperlipidemia 10/16/2011  . Hyperparathyroidism, secondary ([place]) 06/16/2011  . HYPERTENSION 06/18/2007  . Hyperthyroidism   . Hypocalcemia 06/07/2010  . Ischemic cardiomyopathy    a. 10/2016 Echo: EF 40-45%.  . Lumbar stenosis with neurogenic claudication   . ONYCHOMYCOSIS, TOENAILS 12/26/2007  . OSA on CPAP 10/16/2011  . Other malaise and fatigue 11/24/2009  . PERIPHERAL NEUROPATHY 06/18/2007  . Prostate cancer ([place])   . PSVT (paroxysmal supraventricular tachycardia) ([place])    a. 51/8841 complicated by NSTEMI;  b. 11/2016 Treated w/ adenosine in ED;  c. 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  [name] PULMONARY NODULE, RIGHT LOWER LOBE 06/08/2009  . Sleep apnea    cpap machine and o2  . TRANSAMINASES, SERUM, ELEVATED 02/01/2010  . Transfusion history    none recent  . Unspecified hypotension 01/30/2010   Past Surgical History:  Procedure Laterality Date  . A/V SHUNTOGRAM N/A 04/07/2018   Procedure: A/V SHUNTOGRAM;  Surgeon: Waynetta Sandy, MD;  Location: [place] CV [facility];  Service: Cardiovascular;  Laterality: N/A;  . ARTERIOVENOUS GRAFT PLACEMENT Right 2009   forearm/notes 02/01/2011  . AV FISTULA PLACEMENT  11/07/2011   Procedure: INSERTION OF ARTERIOVENOUS (AV) GORE-TEX GRAFT ARM;  Surgeon: Tinnie Gens, MD;  Location: [place];  Service: Vascular;  Laterality: Left;  . BACK SURGERY  1998  . BASCILIC VEIN TRANSPOSITION Right 02/27/2013   Procedure: BASCILIC VEIN TRANSPOSITION;  Surgeon: Mal Misty, MD;  Location: [address];  Service: Vascular;  Laterality: Right;  Right Basilic Vein Transposition   . CARDIAC  CATHETERIZATION N/A 08/06/2016   Procedure: Left Heart Cath and Coronary Angiography;  Surgeon: Jolaine Artist, MD;  Location: [place] [facility];  Service: Cardiovascular;  Laterality: N/A;  . CARDIAC CATHETERIZATION N/A 08/07/2016   Procedure: Coronary/Graft Atherectomy-CSI LAD;  Surgeon: Peter M Martinique, MD;  Location: [place] [facility];  Service: Cardiovascular;  Laterality: N/A;  . CERVICAL SPINE SURGERY  2/09   "to repair nerve problems in my left arm"  . CHOLECYSTECTOMY    . COLONOSCOPY WITH PROPOFOL N/A 04/26/2017   Procedure: COLONOSCOPY WITH PROPOFOL;  Surgeon: Otis Brace, MD;  Location: [place];  Service: Gastroenterology;  Laterality: N/A;  . CORONARY ANGIOPLASTY WITH STENT PLACEMENT  06/11/2008  . CORONARY ANGIOPLASTY WITH STENT PLACEMENT  06/2007   TAXUS stent to RCA/notes 01/31/2011  . ESOPHAGOGASTRODUODENOSCOPY  09/28/2011   Procedure: ESOPHAGOGASTRODUODENOSCOPY (EGD);  Surgeon: Missy Sabins, MD;  Location: [facility] ENDOSCOPY;  Service: Endoscopy;  Laterality: N/A;  . ESOPHAGOGASTRODUODENOSCOPY N/A 04/07/2015   Procedure: ESOPHAGOGASTRODUODENOSCOPY (EGD);  Surgeon: Teena Irani, MD;  Location: [facility];  Service: Endoscopy;  Laterality: N/A;  . ESOPHAGOGASTRODUODENOSCOPY N/A 04/19/2015   Procedure: ESOPHAGOGASTRODUODENOSCOPY (EGD);  Surgeon: Arta Silence, MD;  Location[facility] ENDOSCOPY;  Service: Endoscopy;  Laterality: N/A;  . ESOPHAGOGASTRODUODENOSCOPY (EGD) WITH PROPOFOL N/A 06/13/2018   Procedure: ESOPHAGOGASTRODUODENOSCOPY (EGD) WITH PROPOFOL;  Surgeon: Wonda Horner, MD;  Location: [facility]COPY;  Service: Endoscopy;  Laterality: N/A;  . FLEXIBLE SIGMOIDOSCOPY N/A 05/21/2017   Procedure: Conni Elliot;  Surgeon: Clarene Essex, MD;  Location: [place];  Service: Endoscopy;  Laterality: N/A;  . FLEXIBLE SIGMOIDOSCOPY Left 07/02/2017   Procedure: FLEXIBLE SIGMOIDOSCOPY;  Surgeon: Laurence Spates, MD;  Location: [place];  Service: Endoscopy;  Laterality:  Left;  . FOREIGN BODY REMOVAL  09/2003   via upper endoscopy/notes 02/12/2011  . GIVENS CAPSULE STUDY  09/30/2011   Procedure: GIVENS CAPSULE STUDY;  Surgeon: Jeryl Columbia, MD;  Location: [facility] ENDOSCOPY;  Service: Endoscopy;  Laterality: N/A;  . INSERTION OF DIALYSIS CATHETER Right 2014  . INSERTION OF DIALYSIS CATHETER Left 02/11/2013   Procedure: INSERTION OF DIALYSIS CATHETER;  Surgeon: Conrad [place], MD;  Location: [place];  Service: Vascular;  Laterality: Left;  Ultrasound guided  . LUMBAR LAMINECTOMY/DECOMPRESSION MICRODISCECTOMY Bilateral 07/31/2017   Procedure: LAMINECTOMY AND FORAMINOTOMY- BILATERAL LUMBAR TWO- LUMBAR THREE;  Surgeon: Earnie Larsson, MD;  Location: [place];  Service: Neurosurgery;  Laterality: Bilateral;  LAMINECTOMY AND FORAMINOTOMY- BILATERAL LUMBAR 2- LUMBAR 3  . PERIPHERAL VASCULAR BALLOON ANGIOPLASTY  04/07/2018   Procedure: PERIPHERAL VASCULAR BALLOON ANGIOPLASTY;  Surgeon: Waynetta Sandy, MD;  Location: [place] [facility];  Service: Cardiovascular;;  RUE AVF  . REMOVAL OF A DIALYSIS CATHETER Right 02/11/2013   Procedure: REMOVAL OF A DIALYSIS CATHETER;  Surgeon: Conrad [place]hurst, MD;  Location: [place];  Service: Vascular;  Laterality: Right;  . SAVORY DILATION N/A 04/07/2015   Procedure: SAVORY DILATION;  Surgeon: Teena Irani, MD;  Location: [facility] ENDOSCOPY;  Service: Endoscopy;  Laterality: N/A;  . SHUNTOGRAM N/A 09/20/2011   Procedure: Earney Mallet;  Surgeon: Conrad [place], MD;  Location: [place] CATH LAB;  Service: Cardiovascular;  Laterality: N/A;  . SVT ABLATION N/A 11/26/2016   Procedure: SVT Ablation;  Surgeon: Will Meredith Leeds, MD;  Location: [place] [facility];  Service: Cardiovascular;  Laterality: N/A;  . TONSILLECTOMY    . TOTAL KNEE ARTHROPLASTY Right 08/02/2015   Procedure: TOTAL KNEE ARTHROPLASTY;  Surgeon: Renette Butters, MD;  Location: [place];  Service: Orthopedics;  Laterality: Right;  . VENOGRAM N/A 01/26/2013   Procedure: VENOGRAM;  Surgeon: Angelia Mould, MD;  Location: [facility] CATH LAB;  Service: Cardiovascular;  Laterality: N/A;    reports that he quit smoking about 12 years ago. His smoking use included cigarettes. He has a 25.00 pack-year smoking history. He quit smokeless tobacco use about 12 years ago. He reports that he does not drink alcohol or use drugs. family history includes Diabetes in his father; Healthy in his child, child, and child; Heart disease in his father and sister; Hypertension in his father; Kidney failure in his father; Thyroid nodules in his sister. Allergies  Allergen Reactions  . Cephalexin Swelling and Other (See Comments)    Tongue swelling, but no breathing issues  . Statins Other (See Comments)    Weak muscles  . Ciprofloxacin Rash   Current Outpatient Medications on File Prior to Visit  Medication Sig Dispense Refill  . acetaminophen (TYLENOL) 325 MG tablet Take 650 mg by mouth every 6 (six) hours as needed for mild pain or headache.     . Alirocumab (PRALUENT) 150 MG/ML SOPN Inject 150 mg into the skin every 14 (fourteen) days. 2 pen 12  . allopurinol (ZYLOPRIM) 100 MG tablet TAKE ONE TABLET BY MOUTH ONCE DAILY 90 tablet 3  . Amino Acids-Protein Hydrolys (FEEDING SUPPLEMENT, PRO-STAT SUGAR FREE 64,) LIQD Take 30 mLs by mouth 2 (two) times daily. 900 mL 0  . aspirin EC 81 MG tablet Take 81 mg by mouth daily.    . betamethasone dipropionate (DIPROLENE) 0.05 % cream Apply 1 application topically daily as needed (leg sores).    . citalopram (CELEXA) 10 MG tablet Take 1 tablet (10 mg total) by mouth at bedtime. 90 tablet 3  . ezetimibe (ZETIA) 10 MG tablet TAKE ONE TABLET BY MOUTH ONCE DAILY 90 tablet 3  . ferric citrate (AURYXIA) 1 GM 210 MG(Fe) tablet Take 210-420 mg by mouth See admin instructions. Take 420 mg by mouth two times a day and 210 mg with each snack    . gabapentin (NEURONTIN) 300 MG capsule Take 1 capsule (300 mg total) by mouth at bedtime. (Patient taking differently: Take 600 mg by mouth at  bedtime. )    . hydroxypropyl methylcellulose / hypromellose (ISOPTO TEARS / GONIOVISC) 2.5 % ophthalmic solution Place 1 drop into both eyes 3 (three) times daily as needed for dry eyes. 15 mL 11  . methimazole (TAPAZOLE) 5 MG tablet Take 0.5 tablets (2.5 mg total) by mouth daily. 45 tablet 3  . midodrine (PROAMATINE) 10 MG tablet Take 1 tablet (10 mg total) by mouth See admin instructions. Once a day only on dialysis days (Tues/Thurs/Sat) 10 tablet 0  . multivitamin (RENA-VIT) TABS tablet Take 1 tablet by mouth daily. Reported on 10/24/2015    . pantoprazole (PROTONIX) 40 MG tablet Take 40 mg by mouth every evening.     . polyethylene glycol (MIRALAX / GLYCOLAX) packet Take 17 g by mouth daily. (Patient taking differently: Take 17 g by mouth daily as needed for mild constipation. ) 14 each 0  .  saccharomyces boulardii (FLORASTOR) 250 MG capsule Take 1 capsule (250 mg total) by mouth 2 (two) times daily. 20 capsule 0  . SENSIPAR 90 MG tablet Take 90 mg by mouth 2 (two) times daily.    Earnestine Mealing 625 MG tablet TAKE THREE TABLETS BY MOUTH TWICE DAILY WITH MEALS 180 tablet 11   No current facility-administered medications on file prior to visit.    Review of Systems  Constitutional: Negative for other unusual diaphoresis or sweats HENT: Negative for ear discharge or swelling Eyes: Negative for other worsening visual disturbances Respiratory: Negative for stridor or other swelling  Gastrointestinal: Negative for worsening distension or other blood Genitourinary: Negative for retention or other urinary change Musculoskeletal: Negative for other MSK pain or swelling Skin: Negative for color change or other new lesions Neurological: Negative for worsening tremors and other numbness  Psychiatric/Behavioral: Negative for worsening agitation or other fatigue All other system neg per pt    Objective:   Physical Exam BP 116/78   Pulse (!) 103   Temp 98.3 F (36.8 C) (Oral)   Ht 6\' 1"  (1.854 m)    Wt 228 lb (103.4 kg)   SpO2 92%   BMI 30.08 kg/m  VS noted, fatigued, non toxic Constitutional: Pt appears in NAD HENT: Head: NCAT.  Right Ear: External ear normal.  Left Ear: External ear normal.  Eyes: . Pupils are equal, round, and reactive to light. Conjunctivae with bilateral erythema and EOM are normal Tongue with dorsal whitish coating ith discomfort Nose: without d/c or deformity Neck: Neck supple. Gross normal ROM Cardiovascular: Normal rate and regular rhythm.   Pulmonary/Chest: Effort normal and breath sounds decreased without rales or wheezing.  Abd:  Soft, NT, ND, + BS, no organomegaly Neurological: Pt is alert. At baseline orientation, motor grossly intact Skin: Skin is warm. No rashes, other new lesions, no LE edema Psychiatric: Pt behavior is normal without agitation  No other exam findings     Assessment & Plan:

## 2018-06-21 DIAGNOSIS — E119 Type 2 diabetes mellitus without complications: Secondary | ICD-10-CM | POA: Diagnosis not present

## 2018-06-21 DIAGNOSIS — H1033 Unspecified acute conjunctivitis, bilateral: Secondary | ICD-10-CM | POA: Insufficient documentation

## 2018-06-21 DIAGNOSIS — Z23 Encounter for immunization: Secondary | ICD-10-CM | POA: Diagnosis not present

## 2018-06-21 DIAGNOSIS — D631 Anemia in chronic kidney disease: Secondary | ICD-10-CM | POA: Diagnosis not present

## 2018-06-21 DIAGNOSIS — N2581 Secondary hyperparathyroidism of renal origin: Secondary | ICD-10-CM | POA: Diagnosis not present

## 2018-06-21 DIAGNOSIS — B370 Candidal stomatitis: Secondary | ICD-10-CM | POA: Insufficient documentation

## 2018-06-21 DIAGNOSIS — N186 End stage renal disease: Secondary | ICD-10-CM | POA: Diagnosis not present

## 2018-06-21 NOTE — Assessment & Plan Note (Signed)
Stable, for f/u cbc

## 2018-06-21 NOTE — Assessment & Plan Note (Signed)
Mild to mod, for nystatin course,  to f/u any worsening symptoms or concerns

## 2018-06-21 NOTE — Assessment & Plan Note (Signed)
Mild to mod, for antibx course,  to f/u any worsening symptoms or concerns 

## 2018-06-21 NOTE — Assessment & Plan Note (Signed)
Resolved, to cont PT as ordered

## 2018-06-23 DIAGNOSIS — N2581 Secondary hyperparathyroidism of renal origin: Secondary | ICD-10-CM | POA: Diagnosis not present

## 2018-06-23 DIAGNOSIS — I5042 Chronic combined systolic (congestive) and diastolic (congestive) heart failure: Secondary | ICD-10-CM | POA: Diagnosis not present

## 2018-06-23 DIAGNOSIS — I132 Hypertensive heart and chronic kidney disease with heart failure and with stage 5 chronic kidney disease, or end stage renal disease: Secondary | ICD-10-CM | POA: Diagnosis not present

## 2018-06-23 DIAGNOSIS — J449 Chronic obstructive pulmonary disease, unspecified: Secondary | ICD-10-CM | POA: Diagnosis not present

## 2018-06-23 DIAGNOSIS — E119 Type 2 diabetes mellitus without complications: Secondary | ICD-10-CM | POA: Diagnosis not present

## 2018-06-23 DIAGNOSIS — D631 Anemia in chronic kidney disease: Secondary | ICD-10-CM | POA: Diagnosis not present

## 2018-06-23 DIAGNOSIS — E1122 Type 2 diabetes mellitus with diabetic chronic kidney disease: Secondary | ICD-10-CM | POA: Diagnosis not present

## 2018-06-23 DIAGNOSIS — N186 End stage renal disease: Secondary | ICD-10-CM | POA: Diagnosis not present

## 2018-06-23 DIAGNOSIS — Z23 Encounter for immunization: Secondary | ICD-10-CM | POA: Diagnosis not present

## 2018-06-24 DIAGNOSIS — N2581 Secondary hyperparathyroidism of renal origin: Secondary | ICD-10-CM | POA: Diagnosis not present

## 2018-06-24 DIAGNOSIS — D631 Anemia in chronic kidney disease: Secondary | ICD-10-CM | POA: Diagnosis not present

## 2018-06-24 DIAGNOSIS — E119 Type 2 diabetes mellitus without complications: Secondary | ICD-10-CM | POA: Diagnosis not present

## 2018-06-24 DIAGNOSIS — N186 End stage renal disease: Secondary | ICD-10-CM | POA: Diagnosis not present

## 2018-06-24 DIAGNOSIS — Z23 Encounter for immunization: Secondary | ICD-10-CM | POA: Diagnosis not present

## 2018-06-25 DIAGNOSIS — J449 Chronic obstructive pulmonary disease, unspecified: Secondary | ICD-10-CM | POA: Diagnosis not present

## 2018-06-25 DIAGNOSIS — N186 End stage renal disease: Secondary | ICD-10-CM | POA: Diagnosis not present

## 2018-06-25 DIAGNOSIS — E1122 Type 2 diabetes mellitus with diabetic chronic kidney disease: Secondary | ICD-10-CM | POA: Diagnosis not present

## 2018-06-25 DIAGNOSIS — D631 Anemia in chronic kidney disease: Secondary | ICD-10-CM | POA: Diagnosis not present

## 2018-06-25 DIAGNOSIS — I5042 Chronic combined systolic (congestive) and diastolic (congestive) heart failure: Secondary | ICD-10-CM | POA: Diagnosis not present

## 2018-06-25 DIAGNOSIS — I132 Hypertensive heart and chronic kidney disease with heart failure and with stage 5 chronic kidney disease, or end stage renal disease: Secondary | ICD-10-CM | POA: Diagnosis not present

## 2018-06-26 DIAGNOSIS — N2581 Secondary hyperparathyroidism of renal origin: Secondary | ICD-10-CM | POA: Diagnosis not present

## 2018-06-26 DIAGNOSIS — E119 Type 2 diabetes mellitus without complications: Secondary | ICD-10-CM | POA: Diagnosis not present

## 2018-06-26 DIAGNOSIS — N186 End stage renal disease: Secondary | ICD-10-CM | POA: Diagnosis not present

## 2018-06-26 DIAGNOSIS — Z23 Encounter for immunization: Secondary | ICD-10-CM | POA: Diagnosis not present

## 2018-06-26 DIAGNOSIS — D631 Anemia in chronic kidney disease: Secondary | ICD-10-CM | POA: Diagnosis not present

## 2018-06-27 ENCOUNTER — Other Ambulatory Visit: Payer: Self-pay | Admitting: Internal Medicine

## 2018-06-27 DIAGNOSIS — I132 Hypertensive heart and chronic kidney disease with heart failure and with stage 5 chronic kidney disease, or end stage renal disease: Secondary | ICD-10-CM | POA: Diagnosis not present

## 2018-06-27 DIAGNOSIS — E1122 Type 2 diabetes mellitus with diabetic chronic kidney disease: Secondary | ICD-10-CM | POA: Diagnosis not present

## 2018-06-27 DIAGNOSIS — D631 Anemia in chronic kidney disease: Secondary | ICD-10-CM | POA: Diagnosis not present

## 2018-06-27 DIAGNOSIS — J449 Chronic obstructive pulmonary disease, unspecified: Secondary | ICD-10-CM | POA: Diagnosis not present

## 2018-06-27 DIAGNOSIS — N186 End stage renal disease: Secondary | ICD-10-CM | POA: Diagnosis not present

## 2018-06-27 DIAGNOSIS — I5042 Chronic combined systolic (congestive) and diastolic (congestive) heart failure: Secondary | ICD-10-CM | POA: Diagnosis not present

## 2018-06-28 DIAGNOSIS — Z23 Encounter for immunization: Secondary | ICD-10-CM | POA: Diagnosis not present

## 2018-06-28 DIAGNOSIS — N2581 Secondary hyperparathyroidism of renal origin: Secondary | ICD-10-CM | POA: Diagnosis not present

## 2018-06-28 DIAGNOSIS — D631 Anemia in chronic kidney disease: Secondary | ICD-10-CM | POA: Diagnosis not present

## 2018-06-28 DIAGNOSIS — N186 End stage renal disease: Secondary | ICD-10-CM | POA: Diagnosis not present

## 2018-06-28 DIAGNOSIS — E119 Type 2 diabetes mellitus without complications: Secondary | ICD-10-CM | POA: Diagnosis not present

## 2018-06-30 DIAGNOSIS — I132 Hypertensive heart and chronic kidney disease with heart failure and with stage 5 chronic kidney disease, or end stage renal disease: Secondary | ICD-10-CM | POA: Diagnosis not present

## 2018-06-30 DIAGNOSIS — J449 Chronic obstructive pulmonary disease, unspecified: Secondary | ICD-10-CM | POA: Diagnosis not present

## 2018-06-30 DIAGNOSIS — D631 Anemia in chronic kidney disease: Secondary | ICD-10-CM | POA: Diagnosis not present

## 2018-06-30 DIAGNOSIS — E1122 Type 2 diabetes mellitus with diabetic chronic kidney disease: Secondary | ICD-10-CM | POA: Diagnosis not present

## 2018-06-30 DIAGNOSIS — I5042 Chronic combined systolic (congestive) and diastolic (congestive) heart failure: Secondary | ICD-10-CM | POA: Diagnosis not present

## 2018-06-30 DIAGNOSIS — N186 End stage renal disease: Secondary | ICD-10-CM | POA: Diagnosis not present

## 2018-07-01 DIAGNOSIS — E1129 Type 2 diabetes mellitus with other diabetic kidney complication: Secondary | ICD-10-CM | POA: Diagnosis not present

## 2018-07-01 DIAGNOSIS — I252 Old myocardial infarction: Secondary | ICD-10-CM

## 2018-07-01 DIAGNOSIS — B351 Tinea unguium: Secondary | ICD-10-CM

## 2018-07-01 DIAGNOSIS — F0280 Dementia in other diseases classified elsewhere without behavioral disturbance: Secondary | ICD-10-CM

## 2018-07-01 DIAGNOSIS — F329 Major depressive disorder, single episode, unspecified: Secondary | ICD-10-CM

## 2018-07-01 DIAGNOSIS — Z96659 Presence of unspecified artificial knee joint: Secondary | ICD-10-CM

## 2018-07-01 DIAGNOSIS — M5412 Radiculopathy, cervical region: Secondary | ICD-10-CM

## 2018-07-01 DIAGNOSIS — Z8701 Personal history of pneumonia (recurrent): Secondary | ICD-10-CM

## 2018-07-01 DIAGNOSIS — J449 Chronic obstructive pulmonary disease, unspecified: Secondary | ICD-10-CM | POA: Diagnosis not present

## 2018-07-01 DIAGNOSIS — I2510 Atherosclerotic heart disease of native coronary artery without angina pectoris: Secondary | ICD-10-CM

## 2018-07-01 DIAGNOSIS — Z992 Dependence on renal dialysis: Secondary | ICD-10-CM | POA: Diagnosis not present

## 2018-07-01 DIAGNOSIS — I132 Hypertensive heart and chronic kidney disease with heart failure and with stage 5 chronic kidney disease, or end stage renal disease: Secondary | ICD-10-CM | POA: Diagnosis not present

## 2018-07-01 DIAGNOSIS — D631 Anemia in chronic kidney disease: Secondary | ICD-10-CM | POA: Diagnosis not present

## 2018-07-01 DIAGNOSIS — E039 Hypothyroidism, unspecified: Secondary | ICD-10-CM

## 2018-07-01 DIAGNOSIS — I255 Ischemic cardiomyopathy: Secondary | ICD-10-CM

## 2018-07-01 DIAGNOSIS — N2581 Secondary hyperparathyroidism of renal origin: Secondary | ICD-10-CM | POA: Diagnosis not present

## 2018-07-01 DIAGNOSIS — M109 Gout, unspecified: Secondary | ICD-10-CM

## 2018-07-01 DIAGNOSIS — Z8546 Personal history of malignant neoplasm of prostate: Secondary | ICD-10-CM

## 2018-07-01 DIAGNOSIS — E119 Type 2 diabetes mellitus without complications: Secondary | ICD-10-CM | POA: Diagnosis not present

## 2018-07-01 DIAGNOSIS — I959 Hypotension, unspecified: Secondary | ICD-10-CM

## 2018-07-01 DIAGNOSIS — Z23 Encounter for immunization: Secondary | ICD-10-CM | POA: Diagnosis not present

## 2018-07-01 DIAGNOSIS — R1310 Dysphagia, unspecified: Secondary | ICD-10-CM

## 2018-07-01 DIAGNOSIS — E1122 Type 2 diabetes mellitus with diabetic chronic kidney disease: Secondary | ICD-10-CM | POA: Diagnosis not present

## 2018-07-01 DIAGNOSIS — N400 Enlarged prostate without lower urinary tract symptoms: Secondary | ICD-10-CM

## 2018-07-01 DIAGNOSIS — K222 Esophageal obstruction: Secondary | ICD-10-CM

## 2018-07-01 DIAGNOSIS — M48062 Spinal stenosis, lumbar region with neurogenic claudication: Secondary | ICD-10-CM

## 2018-07-01 DIAGNOSIS — Z9181 History of falling: Secondary | ICD-10-CM

## 2018-07-01 DIAGNOSIS — Z87891 Personal history of nicotine dependence: Secondary | ICD-10-CM

## 2018-07-01 DIAGNOSIS — G4733 Obstructive sleep apnea (adult) (pediatric): Secondary | ICD-10-CM

## 2018-07-01 DIAGNOSIS — N186 End stage renal disease: Secondary | ICD-10-CM | POA: Diagnosis not present

## 2018-07-01 DIAGNOSIS — E1151 Type 2 diabetes mellitus with diabetic peripheral angiopathy without gangrene: Secondary | ICD-10-CM | POA: Diagnosis not present

## 2018-07-01 DIAGNOSIS — D509 Iron deficiency anemia, unspecified: Secondary | ICD-10-CM | POA: Diagnosis not present

## 2018-07-01 DIAGNOSIS — I5042 Chronic combined systolic (congestive) and diastolic (congestive) heart failure: Secondary | ICD-10-CM | POA: Diagnosis not present

## 2018-07-01 DIAGNOSIS — E1142 Type 2 diabetes mellitus with diabetic polyneuropathy: Secondary | ICD-10-CM

## 2018-07-02 DIAGNOSIS — N186 End stage renal disease: Secondary | ICD-10-CM | POA: Diagnosis not present

## 2018-07-02 DIAGNOSIS — E1122 Type 2 diabetes mellitus with diabetic chronic kidney disease: Secondary | ICD-10-CM | POA: Diagnosis not present

## 2018-07-02 DIAGNOSIS — I132 Hypertensive heart and chronic kidney disease with heart failure and with stage 5 chronic kidney disease, or end stage renal disease: Secondary | ICD-10-CM | POA: Diagnosis not present

## 2018-07-02 DIAGNOSIS — I5042 Chronic combined systolic (congestive) and diastolic (congestive) heart failure: Secondary | ICD-10-CM | POA: Diagnosis not present

## 2018-07-02 DIAGNOSIS — D631 Anemia in chronic kidney disease: Secondary | ICD-10-CM | POA: Diagnosis not present

## 2018-07-02 DIAGNOSIS — J449 Chronic obstructive pulmonary disease, unspecified: Secondary | ICD-10-CM | POA: Diagnosis not present

## 2018-07-03 DIAGNOSIS — D631 Anemia in chronic kidney disease: Secondary | ICD-10-CM | POA: Diagnosis not present

## 2018-07-03 DIAGNOSIS — Z23 Encounter for immunization: Secondary | ICD-10-CM | POA: Diagnosis not present

## 2018-07-03 DIAGNOSIS — E119 Type 2 diabetes mellitus without complications: Secondary | ICD-10-CM | POA: Diagnosis not present

## 2018-07-03 DIAGNOSIS — N2581 Secondary hyperparathyroidism of renal origin: Secondary | ICD-10-CM | POA: Diagnosis not present

## 2018-07-03 DIAGNOSIS — D509 Iron deficiency anemia, unspecified: Secondary | ICD-10-CM | POA: Diagnosis not present

## 2018-07-03 DIAGNOSIS — N186 End stage renal disease: Secondary | ICD-10-CM | POA: Diagnosis not present

## 2018-07-04 DIAGNOSIS — I5042 Chronic combined systolic (congestive) and diastolic (congestive) heart failure: Secondary | ICD-10-CM | POA: Diagnosis not present

## 2018-07-04 DIAGNOSIS — E1122 Type 2 diabetes mellitus with diabetic chronic kidney disease: Secondary | ICD-10-CM | POA: Diagnosis not present

## 2018-07-04 DIAGNOSIS — J449 Chronic obstructive pulmonary disease, unspecified: Secondary | ICD-10-CM | POA: Diagnosis not present

## 2018-07-04 DIAGNOSIS — I132 Hypertensive heart and chronic kidney disease with heart failure and with stage 5 chronic kidney disease, or end stage renal disease: Secondary | ICD-10-CM | POA: Diagnosis not present

## 2018-07-04 DIAGNOSIS — N186 End stage renal disease: Secondary | ICD-10-CM | POA: Diagnosis not present

## 2018-07-04 DIAGNOSIS — D631 Anemia in chronic kidney disease: Secondary | ICD-10-CM | POA: Diagnosis not present

## 2018-07-05 DIAGNOSIS — E119 Type 2 diabetes mellitus without complications: Secondary | ICD-10-CM | POA: Diagnosis not present

## 2018-07-05 DIAGNOSIS — D631 Anemia in chronic kidney disease: Secondary | ICD-10-CM | POA: Diagnosis not present

## 2018-07-05 DIAGNOSIS — N2581 Secondary hyperparathyroidism of renal origin: Secondary | ICD-10-CM | POA: Diagnosis not present

## 2018-07-05 DIAGNOSIS — Z23 Encounter for immunization: Secondary | ICD-10-CM | POA: Diagnosis not present

## 2018-07-05 DIAGNOSIS — D509 Iron deficiency anemia, unspecified: Secondary | ICD-10-CM | POA: Diagnosis not present

## 2018-07-05 DIAGNOSIS — N186 End stage renal disease: Secondary | ICD-10-CM | POA: Diagnosis not present

## 2018-07-07 DIAGNOSIS — I871 Compression of vein: Secondary | ICD-10-CM | POA: Diagnosis not present

## 2018-07-07 DIAGNOSIS — N186 End stage renal disease: Secondary | ICD-10-CM | POA: Diagnosis not present

## 2018-07-07 DIAGNOSIS — Z992 Dependence on renal dialysis: Secondary | ICD-10-CM | POA: Diagnosis not present

## 2018-07-07 DIAGNOSIS — T82898A Other specified complication of vascular prosthetic devices, implants and grafts, initial encounter: Secondary | ICD-10-CM | POA: Diagnosis not present

## 2018-07-08 DIAGNOSIS — N2581 Secondary hyperparathyroidism of renal origin: Secondary | ICD-10-CM | POA: Diagnosis not present

## 2018-07-08 DIAGNOSIS — Z23 Encounter for immunization: Secondary | ICD-10-CM | POA: Diagnosis not present

## 2018-07-08 DIAGNOSIS — D509 Iron deficiency anemia, unspecified: Secondary | ICD-10-CM | POA: Diagnosis not present

## 2018-07-08 DIAGNOSIS — E119 Type 2 diabetes mellitus without complications: Secondary | ICD-10-CM | POA: Diagnosis not present

## 2018-07-08 DIAGNOSIS — N186 End stage renal disease: Secondary | ICD-10-CM | POA: Diagnosis not present

## 2018-07-08 DIAGNOSIS — D631 Anemia in chronic kidney disease: Secondary | ICD-10-CM | POA: Diagnosis not present

## 2018-07-09 DIAGNOSIS — E1122 Type 2 diabetes mellitus with diabetic chronic kidney disease: Secondary | ICD-10-CM | POA: Diagnosis not present

## 2018-07-09 DIAGNOSIS — I132 Hypertensive heart and chronic kidney disease with heart failure and with stage 5 chronic kidney disease, or end stage renal disease: Secondary | ICD-10-CM | POA: Diagnosis not present

## 2018-07-09 DIAGNOSIS — D631 Anemia in chronic kidney disease: Secondary | ICD-10-CM | POA: Diagnosis not present

## 2018-07-09 DIAGNOSIS — N186 End stage renal disease: Secondary | ICD-10-CM | POA: Diagnosis not present

## 2018-07-09 DIAGNOSIS — I5042 Chronic combined systolic (congestive) and diastolic (congestive) heart failure: Secondary | ICD-10-CM | POA: Diagnosis not present

## 2018-07-09 DIAGNOSIS — J449 Chronic obstructive pulmonary disease, unspecified: Secondary | ICD-10-CM | POA: Diagnosis not present

## 2018-07-10 DIAGNOSIS — Z23 Encounter for immunization: Secondary | ICD-10-CM | POA: Diagnosis not present

## 2018-07-10 DIAGNOSIS — N2581 Secondary hyperparathyroidism of renal origin: Secondary | ICD-10-CM | POA: Diagnosis not present

## 2018-07-10 DIAGNOSIS — D509 Iron deficiency anemia, unspecified: Secondary | ICD-10-CM | POA: Diagnosis not present

## 2018-07-10 DIAGNOSIS — E119 Type 2 diabetes mellitus without complications: Secondary | ICD-10-CM | POA: Diagnosis not present

## 2018-07-10 DIAGNOSIS — N186 End stage renal disease: Secondary | ICD-10-CM | POA: Diagnosis not present

## 2018-07-10 DIAGNOSIS — D631 Anemia in chronic kidney disease: Secondary | ICD-10-CM | POA: Diagnosis not present

## 2018-07-11 ENCOUNTER — Ambulatory Visit (INDEPENDENT_AMBULATORY_CARE_PROVIDER_SITE_OTHER): Payer: Medicare Other | Admitting: Nurse Practitioner

## 2018-07-11 ENCOUNTER — Encounter: Payer: Self-pay | Admitting: Nurse Practitioner

## 2018-07-11 DIAGNOSIS — I200 Unstable angina: Secondary | ICD-10-CM | POA: Diagnosis not present

## 2018-07-11 DIAGNOSIS — J449 Chronic obstructive pulmonary disease, unspecified: Secondary | ICD-10-CM | POA: Diagnosis not present

## 2018-07-11 DIAGNOSIS — E1122 Type 2 diabetes mellitus with diabetic chronic kidney disease: Secondary | ICD-10-CM | POA: Diagnosis not present

## 2018-07-11 DIAGNOSIS — G4733 Obstructive sleep apnea (adult) (pediatric): Secondary | ICD-10-CM

## 2018-07-11 DIAGNOSIS — N186 End stage renal disease: Secondary | ICD-10-CM | POA: Diagnosis not present

## 2018-07-11 DIAGNOSIS — I5042 Chronic combined systolic (congestive) and diastolic (congestive) heart failure: Secondary | ICD-10-CM | POA: Diagnosis not present

## 2018-07-11 DIAGNOSIS — D631 Anemia in chronic kidney disease: Secondary | ICD-10-CM | POA: Diagnosis not present

## 2018-07-11 DIAGNOSIS — I132 Hypertensive heart and chronic kidney disease with heart failure and with stage 5 chronic kidney disease, or end stage renal disease: Secondary | ICD-10-CM | POA: Diagnosis not present

## 2018-07-11 NOTE — Patient Instructions (Addendum)
Patient continues to benefit from CPAP and reports good compliance Continue CPAP each night at current settings Goal of 4 hours per night Please bring SD card for download Follow up with Dr. Halford Chessman in 1 year or sooner if needed

## 2018-07-11 NOTE — Progress Notes (Signed)
@Patient  ID: [name], male    DOB: 08-Mar-1951, 67 y.o.   MRN: [id number]  Chief Complaint  Patient presents with  . Follow-up    Referring provider: Biagio Borg, MD  HPI  67 year old male former smoker with OSA followed by Dr. Halford Chessman Health history includes HTN, PAD, CHF  Tests: Sleep tests PSG 04/24/06 >> AHI 110.7, SpO2 low 81% Auto CPAP 02/14/17 to 03/15/17 >> used on 30 of 30 nights with average 9 hrs 49 min.  Average AHI 3.8 with median CPAP 12 and 95 th percentile CPAP 18 cm H2O  Pulmonary tests PFT 01/12/09 >> FEV1 2.53 (73%), FEV1% 81, TLC 4.99 (70%), DLCO 64%  Cardiac tests Echo 10/17/16 >> EF 40 to 45%, grade 27 DD 67 year old male  Will bring machine  OV 07/14/18 - CPAP follow up Patient presents for a 1 year follow up on CPAP. He states that he has been doing well over all. He is compliant with his CPAP. He wears it nightly. Feels much btter throughout the day after using it. He denies any issues with his CPAP. He has been in the hospital for Pneumonia since last visit. He is has followed up with his PCP for this. States that he is doing well, but still trying to regain his strength back.    Allergies  Allergen Reactions  . Cephalexin Swelling and Other (See Comments)    Tongue swelling, but no breathing issues  . Statins Other (See Comments)    Weak muscles  . Ciprofloxacin Rash    Immunization History  Administered Date(s) Administered  . Influenza Split 07/16/2013, 06/08/2017  . Influenza Whole 08/12/2008  . Influenza,inj,Quad PF,6+ Mos 07/01/2014, 06/23/2016  . Influenza-Unspecified 07/04/2015  . Pneumococcal Conjugate-13 01/13/2014  . Pneumococcal Polysaccharide-23 08/31/2008    Past Medical History:  Diagnosis Date  . Allergic rhinitis, cause unspecified 02/24/2014  . Anemia 06/16/2011  . BENIGN PROSTATIC HYPERTROPHY 10/14/2009  . CAD, NATIVE VESSEL 02/06/2009   a. 06/2007 s/p Taxus DES to the RCA;  b. 08/2016 NSTEMI in setting of SVT/PCI: LM  30ost, LAD 52m (3.0x16 Synergy DES), LCX 23m, OM1 60, RI 40, RCA 70p/m, 60m - not amenable to PCI.  [name] Cervical radiculopathy, chronic 02/23/2016   Right c5-6 by NCS/EMG  . Chronic combined systolic (congestive) and diastolic (congestive) heart failure ([place])    a. 10/2016 Echo: EF 40-45%, Gr2 DD. mildly dil LA.  [name] COLONIC POLYPS, HX OF 10/14/2009  . Dementia ([place]) [id number]  . Depression 09/24/2015  . DIABETES MELLITUS, TYPE II 02/01/2010  . DYSLIPIDEMIA 06/18/2007  . ESRD (end stage renal disease) on dialysis ([place]) 08/04/2010   "TTS;  " (04/18/2015)  . FOOT PAIN 08/12/2008  . GAIT DISTURBANCE 03/03/2010  . GASTROENTERITIS, VIRAL 10/14/2009  . GERD 06/18/2007  . GOITER, MULTINODULAR 12/26/2007  . GOUT 06/18/2007  . GYNECOMASTIA 07/17/2010  . Hemodialysis access, fistula mature [facility])    Dialysis T-Th-Sa ([place]) Right upper arm fistula  . Hyperlipidemia 10/16/2011  . Hyperparathyroidism, secondary ([place]) 06/16/2011  . HYPERTENSION 06/18/2007  . Hyperthyroidism   . Hypocalcemia 06/07/2010  . Ischemic cardiomyopathy    a. 10/2016 Echo: EF 40-45%.  . Lumbar stenosis with neurogenic claudication   . ONYCHOMYCOSIS, TOENAILS 12/26/2007  . OSA on CPAP 10/16/2011  . Other malaise and fatigue 11/24/2009  . PERIPHERAL NEUROPATHY 06/18/2007  . Prostate cancer ([place])   . PSVT (paroxysmal supraventricular tachycardia) ([place])    a. 28/4132 complicated by NSTEMI;  b. 11/2016 Treated w/ adenosine in ED;  c. 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  [name] PULMONARY NODULE, RIGHT LOWER LOBE 06/08/2009  . Sleep apnea    cpap machine and o2  . TRANSAMINASES, SERUM, ELEVATED 02/01/2010  . Transfusion history    none recent  . Unspecified hypotension 01/30/2010    Tobacco History: Social History   Tobacco Use  Smoking Status Former Smoker  . Packs/day: 1.00  . Years: 25.00  . Pack years: 25.00  . Types: Cigarettes  . Last attempt to quit: 10/01/2005  . Years since quitting: 12.7  Smokeless Tobacco Former Systems developer  .  Quit date: 10/01/2005  Tobacco Comment   Quit smoking 2007 Smoked x 25 years 1/2 ppd.   Counseling given: Yes Comment: Quit smoking 2007 Smoked x 25 years 1/2 ppd.   Outpatient Encounter Medications as of 07/11/2018  Medication Sig  . acetaminophen (TYLENOL) 325 MG tablet Take 650 mg by mouth every 6 (six) hours as needed for mild pain or headache.   . Alirocumab (PRALUENT) 150 MG/ML SOPN Inject 150 mg into the skin every 14 (fourteen) days.  [name] allopurinol (ZYLOPRIM) 100 MG tablet TAKE ONE TABLET BY MOUTH ONCE DAILY  . Amino Acids-Protein Hydrolys (FEEDING SUPPLEMENT, PRO-STAT SUGAR FREE 64,) LIQD Take 30 mLs by mouth 2 (two) times daily.  [name] aspirin EC 81 MG tablet Take 81 mg by mouth daily.  . betamethasone dipropionate (DIPROLENE) 0.05 % cream Apply 1 application topically daily as needed (leg sores).  . citalopram (CELEXA) 10 MG tablet Take 1 tablet (10 mg total) by mouth at bedtime.  [name] ezetimibe (ZETIA) 10 MG tablet TAKE ONE TABLET BY MOUTH ONCE DAILY  . ferric citrate (AURYXIA) 1 GM 210 MG(Fe) tablet Take 210-420 mg by mouth See admin instructions. Take 420 mg by mouth two times a day and 210 mg with each snack  . gabapentin (NEURONTIN) 300 MG capsule Take 1 capsule (300 mg total) by mouth at bedtime. (Patient taking differently: Take 600 mg by mouth at bedtime. )  . gabapentin (NEURONTIN) 300 MG capsule TAKE 1 TO 2 CAPSULES BY MOUTH AT BEDTIME AS NEEDED FOR PAIN AND SLEEP  . hydroxypropyl methylcellulose / hypromellose (ISOPTO TEARS / GONIOVISC) 2.5 % ophthalmic solution Place 1 drop into both eyes 3 (three) times daily as needed for dry eyes.  . methimazole (TAPAZOLE) 5 MG tablet Take 0.5 tablets (2.5 mg total) by mouth daily.  . midodrine (PROAMATINE) 10 MG tablet Take 1 tablet (10 mg total) by mouth See admin instructions. Once a day only on dialysis days (Tues/Thurs/Sat)  . multivitamin (RENA-VIT) TABS tablet Take 1 tablet by mouth daily. Reported on 10/24/2015  . pantoprazole  (PROTONIX) 40 MG tablet Take 40 mg by mouth every evening.   . polyethylene glycol (MIRALAX / GLYCOLAX) packet Take 17 g by mouth daily. (Patient taking differently: Take 17 g by mouth daily as needed for mild constipation. )  . saccharomyces boulardii (FLORASTOR) 250 MG capsule Take 1 capsule (250 mg total) by mouth 2 (two) times daily.  . SENSIPAR 90 MG tablet Take 90 mg by mouth 2 (two) times daily.  Earnestine Mealing 625 MG tablet TAKE THREE TABLETS BY MOUTH TWICE DAILY WITH MEALS  . zolpidem (AMBIEN) 5 MG tablet Take 1 tablet (5 mg total) by mouth at bedtime as needed. for sleep   No facility-administered encounter medications on file as of 07/11/2018.      Review of Systems  Review of Systems  Constitutional: Negative.  HENT: Negative.   Respiratory: Negative for cough and shortness of breath.   Cardiovascular: Negative.   Gastrointestinal: Negative.   Allergic/Immunologic: Negative.   Neurological: Negative.   Psychiatric/Behavioral: Negative.        Physical Exam  BP 130/70 (BP Location: Left Arm, Patient Position: Sitting, Cuff Size: Normal)   Pulse 86   Ht 6\' 1"  (1.854 m)   Wt 221 lb (100.2 kg)   SpO2 96%   BMI 29.16 kg/m   Wt Readings from Last 5 Encounters:  07/11/18 221 lb (100.2 kg)  06/20/18 228 lb (103.4 kg)  06/15/18 216 lb 0.8 oz (98 kg)  06/09/18 227 lb (103 kg)  06/04/18 224 lb 2 oz (101.7 kg)     Physical Exam  Constitutional: He is oriented to person, place, and time. He appears well-developed and well-nourished. No distress.  Cardiovascular: Normal rate and regular rhythm.  Pulmonary/Chest: Effort normal and breath sounds normal.  Musculoskeletal: He exhibits no edema.  Neurological: He is alert and oriented to person, place, and time.  Skin: Skin is warm and dry.  Psychiatric: He has a normal mood and affect.  Nursing note and vitals reviewed.     Assessment & Plan:   OSA (obstructive sleep apnea) Patient Instructions  Patient continues  to benefit from CPAP and reports good compliance Continue CPAP each night at current settings Goal of 4 hours per night Please bring SD card for download Do not drive if drowsy Work on heathy weight Follow up with Dr. Halford Chessman in 1 year or sooner if needed       Fenton Foy, NP 07/14/2018

## 2018-07-12 DIAGNOSIS — D509 Iron deficiency anemia, unspecified: Secondary | ICD-10-CM | POA: Diagnosis not present

## 2018-07-12 DIAGNOSIS — N186 End stage renal disease: Secondary | ICD-10-CM | POA: Diagnosis not present

## 2018-07-12 DIAGNOSIS — Z23 Encounter for immunization: Secondary | ICD-10-CM | POA: Diagnosis not present

## 2018-07-12 DIAGNOSIS — E119 Type 2 diabetes mellitus without complications: Secondary | ICD-10-CM | POA: Diagnosis not present

## 2018-07-12 DIAGNOSIS — N2581 Secondary hyperparathyroidism of renal origin: Secondary | ICD-10-CM | POA: Diagnosis not present

## 2018-07-12 DIAGNOSIS — D631 Anemia in chronic kidney disease: Secondary | ICD-10-CM | POA: Diagnosis not present

## 2018-07-14 ENCOUNTER — Telehealth: Payer: Self-pay | Admitting: Nurse Practitioner

## 2018-07-14 ENCOUNTER — Encounter: Payer: Self-pay | Admitting: Nurse Practitioner

## 2018-07-14 DIAGNOSIS — N186 End stage renal disease: Secondary | ICD-10-CM | POA: Diagnosis not present

## 2018-07-14 DIAGNOSIS — I132 Hypertensive heart and chronic kidney disease with heart failure and with stage 5 chronic kidney disease, or end stage renal disease: Secondary | ICD-10-CM | POA: Diagnosis not present

## 2018-07-14 DIAGNOSIS — J449 Chronic obstructive pulmonary disease, unspecified: Secondary | ICD-10-CM | POA: Diagnosis not present

## 2018-07-14 DIAGNOSIS — E1122 Type 2 diabetes mellitus with diabetic chronic kidney disease: Secondary | ICD-10-CM | POA: Diagnosis not present

## 2018-07-14 DIAGNOSIS — D631 Anemia in chronic kidney disease: Secondary | ICD-10-CM | POA: Diagnosis not present

## 2018-07-14 DIAGNOSIS — I5042 Chronic combined systolic (congestive) and diastolic (congestive) heart failure: Secondary | ICD-10-CM | POA: Diagnosis not present

## 2018-07-14 NOTE — Progress Notes (Signed)
Reviewed and agree with assessment/plan.   Ioma Chismar, MD [place] [facility] 09/26/2016, 12:24 PM Pager:  [phone]  

## 2018-07-14 NOTE — Assessment & Plan Note (Signed)
Patient Instructions  Patient continues to benefit from CPAP and reports good compliance Continue CPAP each night at current settings Goal of 4 hours per night Please bring SD card for download Do not drive if drowsy Work on heathy weight Follow up with Dr. Halford Chessman in 1 year or sooner if needed

## 2018-07-14 NOTE — Telephone Encounter (Signed)
Spoke with wife in lobby. She has the machine with the SD card for download. I handed the machine to Hinton Dyer to download card and return machine to wife when she returns after her appointment downstairs.  Messaged from desk to let them know to contact Hinton Dyer when patient is here to pick up machine.   Nothing further needed

## 2018-07-15 DIAGNOSIS — D631 Anemia in chronic kidney disease: Secondary | ICD-10-CM | POA: Diagnosis not present

## 2018-07-15 DIAGNOSIS — E119 Type 2 diabetes mellitus without complications: Secondary | ICD-10-CM | POA: Diagnosis not present

## 2018-07-15 DIAGNOSIS — D509 Iron deficiency anemia, unspecified: Secondary | ICD-10-CM | POA: Diagnosis not present

## 2018-07-15 DIAGNOSIS — N186 End stage renal disease: Secondary | ICD-10-CM | POA: Diagnosis not present

## 2018-07-15 DIAGNOSIS — Z23 Encounter for immunization: Secondary | ICD-10-CM | POA: Diagnosis not present

## 2018-07-15 DIAGNOSIS — N2581 Secondary hyperparathyroidism of renal origin: Secondary | ICD-10-CM | POA: Diagnosis not present

## 2018-07-16 ENCOUNTER — Emergency Department (HOSPITAL_COMMUNITY): Payer: Medicare Other

## 2018-07-16 ENCOUNTER — Encounter (HOSPITAL_COMMUNITY): Payer: Self-pay | Admitting: *Deleted

## 2018-07-16 ENCOUNTER — Encounter (HOSPITAL_COMMUNITY): Payer: Self-pay

## 2018-07-16 ENCOUNTER — Ambulatory Visit (HOSPITAL_COMMUNITY)
Admission: RE | Admit: 2018-07-16 | Discharge: 2018-07-16 | Disposition: A | Discharge status: Home / Self Care | Payer: Medicare Other | Source: Ambulatory Visit

## 2018-07-16 ENCOUNTER — Other Ambulatory Visit: Payer: Self-pay

## 2018-07-16 ENCOUNTER — Inpatient Hospital Stay (HOSPITAL_COMMUNITY): Payer: Medicare Other

## 2018-07-16 ENCOUNTER — Ambulatory Visit (HOSPITAL_COMMUNITY): Admission: RE | Admit: 2018-07-16 | Payer: Medicare Other | Source: Ambulatory Visit | Admitting: Internal Medicine

## 2018-07-16 ENCOUNTER — Inpatient Hospital Stay (HOSPITAL_COMMUNITY)
Admission: EM | Admit: 2018-07-16 | Discharge: 2018-07-23 | DRG: 871 | Disposition: A | Discharge status: Home Health | Payer: Medicare Other | Attending: Internal Medicine | Admitting: Internal Medicine

## 2018-07-16 DIAGNOSIS — D631 Anemia in chronic kidney disease: Secondary | ICD-10-CM | POA: Diagnosis present

## 2018-07-16 DIAGNOSIS — R740 Nonspecific elevation of levels of transaminase and lactic acid dehydrogenase [LDH]: Secondary | ICD-10-CM | POA: Diagnosis not present

## 2018-07-16 DIAGNOSIS — I10 Essential (primary) hypertension: Secondary | ICD-10-CM | POA: Diagnosis not present

## 2018-07-16 DIAGNOSIS — I255 Ischemic cardiomyopathy: Secondary | ICD-10-CM | POA: Diagnosis present

## 2018-07-16 DIAGNOSIS — N186 End stage renal disease: Secondary | ICD-10-CM

## 2018-07-16 DIAGNOSIS — M48061 Spinal stenosis, lumbar region without neurogenic claudication: Secondary | ICD-10-CM | POA: Diagnosis not present

## 2018-07-16 DIAGNOSIS — Z8249 Family history of ischemic heart disease and other diseases of the circulatory system: Secondary | ICD-10-CM | POA: Diagnosis not present

## 2018-07-16 DIAGNOSIS — F32A Depression, unspecified: Secondary | ICD-10-CM | POA: Diagnosis present

## 2018-07-16 DIAGNOSIS — Z8546 Personal history of malignant neoplasm of prostate: Secondary | ICD-10-CM

## 2018-07-16 DIAGNOSIS — N200 Calculus of kidney: Secondary | ICD-10-CM | POA: Diagnosis present

## 2018-07-16 DIAGNOSIS — R7401 Elevation of levels of liver transaminase levels: Secondary | ICD-10-CM

## 2018-07-16 DIAGNOSIS — I110 Hypertensive heart disease with heart failure: Secondary | ICD-10-CM | POA: Diagnosis present

## 2018-07-16 DIAGNOSIS — K219 Gastro-esophageal reflux disease without esophagitis: Secondary | ICD-10-CM | POA: Diagnosis present

## 2018-07-16 DIAGNOSIS — E1142 Type 2 diabetes mellitus with diabetic polyneuropathy: Secondary | ICD-10-CM | POA: Diagnosis present

## 2018-07-16 DIAGNOSIS — Z955 Presence of coronary angioplasty implant and graft: Secondary | ICD-10-CM

## 2018-07-16 DIAGNOSIS — I2510 Atherosclerotic heart disease of native coronary artery without angina pectoris: Secondary | ICD-10-CM

## 2018-07-16 DIAGNOSIS — N281 Cyst of kidney, acquired: Secondary | ICD-10-CM | POA: Diagnosis not present

## 2018-07-16 DIAGNOSIS — J323 Chronic sphenoidal sinusitis: Secondary | ICD-10-CM | POA: Diagnosis not present

## 2018-07-16 DIAGNOSIS — E1129 Type 2 diabetes mellitus with other diabetic kidney complication: Secondary | ICD-10-CM

## 2018-07-16 DIAGNOSIS — E1122 Type 2 diabetes mellitus with diabetic chronic kidney disease: Secondary | ICD-10-CM | POA: Diagnosis present

## 2018-07-16 DIAGNOSIS — I132 Hypertensive heart and chronic kidney disease with heart failure and with stage 5 chronic kidney disease, or end stage renal disease: Secondary | ICD-10-CM | POA: Diagnosis present

## 2018-07-16 DIAGNOSIS — E1121 Type 2 diabetes mellitus with diabetic nephropathy: Secondary | ICD-10-CM | POA: Diagnosis not present

## 2018-07-16 DIAGNOSIS — M25462 Effusion, left knee: Secondary | ICD-10-CM

## 2018-07-16 DIAGNOSIS — G4733 Obstructive sleep apnea (adult) (pediatric): Secondary | ICD-10-CM | POA: Diagnosis present

## 2018-07-16 DIAGNOSIS — Z87891 Personal history of nicotine dependence: Secondary | ICD-10-CM

## 2018-07-16 DIAGNOSIS — Z96651 Presence of right artificial knee joint: Secondary | ICD-10-CM | POA: Diagnosis present

## 2018-07-16 DIAGNOSIS — R000 Tachycardia, unspecified: Secondary | ICD-10-CM | POA: Diagnosis not present

## 2018-07-16 DIAGNOSIS — Z992 Dependence on renal dialysis: Secondary | ICD-10-CM

## 2018-07-16 DIAGNOSIS — I953 Hypotension of hemodialysis: Secondary | ICD-10-CM | POA: Diagnosis present

## 2018-07-16 DIAGNOSIS — M109 Gout, unspecified: Secondary | ICD-10-CM | POA: Diagnosis present

## 2018-07-16 DIAGNOSIS — Z833 Family history of diabetes mellitus: Secondary | ICD-10-CM

## 2018-07-16 DIAGNOSIS — I5042 Chronic combined systolic (congestive) and diastolic (congestive) heart failure: Secondary | ICD-10-CM | POA: Diagnosis present

## 2018-07-16 DIAGNOSIS — Z881 Allergy status to other antibiotic agents status: Secondary | ICD-10-CM

## 2018-07-16 DIAGNOSIS — Z8701 Personal history of pneumonia (recurrent): Secondary | ICD-10-CM

## 2018-07-16 DIAGNOSIS — R945 Abnormal results of liver function studies: Secondary | ICD-10-CM | POA: Diagnosis present

## 2018-07-16 DIAGNOSIS — Z841 Family history of disorders of kidney and ureter: Secondary | ICD-10-CM

## 2018-07-16 DIAGNOSIS — N2581 Secondary hyperparathyroidism of renal origin: Secondary | ICD-10-CM | POA: Diagnosis present

## 2018-07-16 DIAGNOSIS — F329 Major depressive disorder, single episode, unspecified: Secondary | ICD-10-CM | POA: Diagnosis present

## 2018-07-16 DIAGNOSIS — I252 Old myocardial infarction: Secondary | ICD-10-CM

## 2018-07-16 DIAGNOSIS — E875 Hyperkalemia: Secondary | ICD-10-CM | POA: Diagnosis present

## 2018-07-16 DIAGNOSIS — A4159 Other Gram-negative sepsis: Secondary | ICD-10-CM | POA: Diagnosis not present

## 2018-07-16 DIAGNOSIS — A419 Sepsis, unspecified organism: Secondary | ICD-10-CM | POA: Diagnosis present

## 2018-07-16 DIAGNOSIS — R6520 Severe sepsis without septic shock: Secondary | ICD-10-CM | POA: Diagnosis not present

## 2018-07-16 DIAGNOSIS — E785 Hyperlipidemia, unspecified: Secondary | ICD-10-CM | POA: Diagnosis present

## 2018-07-16 DIAGNOSIS — E119 Type 2 diabetes mellitus without complications: Secondary | ICD-10-CM | POA: Diagnosis present

## 2018-07-16 DIAGNOSIS — M489 Spondylopathy, unspecified: Secondary | ICD-10-CM | POA: Diagnosis present

## 2018-07-16 DIAGNOSIS — I700 Atherosclerosis of aorta: Secondary | ICD-10-CM | POA: Diagnosis not present

## 2018-07-16 DIAGNOSIS — I959 Hypotension, unspecified: Secondary | ICD-10-CM | POA: Diagnosis not present

## 2018-07-16 DIAGNOSIS — E0590 Thyrotoxicosis, unspecified without thyrotoxic crisis or storm: Secondary | ICD-10-CM | POA: Diagnosis present

## 2018-07-16 DIAGNOSIS — Z9861 Coronary angioplasty status: Secondary | ICD-10-CM

## 2018-07-16 DIAGNOSIS — R531 Weakness: Secondary | ICD-10-CM | POA: Diagnosis not present

## 2018-07-16 DIAGNOSIS — K760 Fatty (change of) liver, not elsewhere classified: Secondary | ICD-10-CM | POA: Diagnosis not present

## 2018-07-16 DIAGNOSIS — M5124 Other intervertebral disc displacement, thoracic region: Secondary | ICD-10-CM | POA: Diagnosis not present

## 2018-07-16 DIAGNOSIS — Z7982 Long term (current) use of aspirin: Secondary | ICD-10-CM

## 2018-07-16 DIAGNOSIS — B961 Klebsiella pneumoniae [K. pneumoniae] as the cause of diseases classified elsewhere: Secondary | ICD-10-CM | POA: Diagnosis present

## 2018-07-16 DIAGNOSIS — R509 Fever, unspecified: Secondary | ICD-10-CM

## 2018-07-16 DIAGNOSIS — E1340 Other specified diabetes mellitus with diabetic neuropathy, unspecified: Secondary | ICD-10-CM

## 2018-07-16 LAB — DIFFERENTIAL
Abs Immature Granulocytes: 0.05 10*3/uL (ref 0.00–0.07)
BASOS ABS: 0 10*3/uL (ref 0.0–0.1)
BASOS PCT: 0 %
EOS PCT: 1 %
Eosinophils Absolute: 0.1 10*3/uL (ref 0.0–0.5)
Immature Granulocytes: 1 %
Lymphocytes Relative: 7 %
Lymphs Abs: 0.7 10*3/uL (ref 0.7–4.0)
MONOS PCT: 10 %
Monocytes Absolute: 1 10*3/uL (ref 0.1–1.0)
NEUTROS ABS: 8 10*3/uL — AB (ref 1.7–7.7)
NEUTROS PCT: 81 %

## 2018-07-16 LAB — CBC
HCT: 41.1 % (ref 39.0–52.0)
HEMOGLOBIN: 12.2 g/dL — AB (ref 13.0–17.0)
MCH: 30.3 pg (ref 26.0–34.0)
MCHC: 29.7 g/dL — AB (ref 30.0–36.0)
MCV: 102 fL — ABNORMAL HIGH (ref 80.0–100.0)
Platelets: 288 10*3/uL (ref 150–400)
RBC: 4.03 MIL/uL — ABNORMAL LOW (ref 4.22–5.81)
RDW: 20.1 % — AB (ref 11.5–15.5)
WBC: 9.8 10*3/uL (ref 4.0–10.5)
nRBC: 0.2 % (ref 0.0–0.2)

## 2018-07-16 LAB — COMPREHENSIVE METABOLIC PANEL
ALK PHOS: 230 U/L — AB (ref 38–126)
ALT: 231 U/L — AB (ref 0–44)
ANION GAP: 15 (ref 5–15)
AST: 193 U/L — ABNORMAL HIGH (ref 15–41)
Albumin: 3.3 g/dL — ABNORMAL LOW (ref 3.5–5.0)
BUN: 41 mg/dL — ABNORMAL HIGH (ref 8–23)
CALCIUM: 8.8 mg/dL — AB (ref 8.9–10.3)
CHLORIDE: 98 mmol/L (ref 98–111)
CO2: 27 mmol/L (ref 22–32)
CREATININE: 10.02 mg/dL — AB (ref 0.61–1.24)
GFR, EST AFRICAN AMERICAN: 5 mL/min — AB (ref >60)
GFR, EST NON AFRICAN AMERICAN: 5 mL/min — AB (ref >60)
Glucose, Bld: 196 mg/dL — ABNORMAL HIGH (ref 70–99)
Potassium: 5.2 mmol/L — ABNORMAL HIGH (ref 3.5–5.1)
Sodium: 140 mmol/L (ref 135–145)
Total Bilirubin: 2.8 mg/dL — ABNORMAL HIGH (ref 0.3–1.2)
Total Protein: 7.6 g/dL (ref 6.5–8.1)

## 2018-07-16 LAB — APTT
APTT: 33 s (ref 24–36)
aPTT: 37 seconds — ABNORMAL HIGH (ref 24–36)

## 2018-07-16 LAB — I-STAT CHEM 8, ED
BUN: 61 mg/dL — ABNORMAL HIGH (ref 8–23)
CREATININE: 10.7 mg/dL — AB (ref 0.61–1.24)
Calcium, Ion: 0.93 mmol/L — ABNORMAL LOW (ref 1.15–1.40)
Chloride: 102 mmol/L (ref 98–111)
GLUCOSE: 194 mg/dL — AB (ref 70–99)
HCT: 41 % (ref 39.0–52.0)
HEMOGLOBIN: 13.9 g/dL (ref 13.0–17.0)
Potassium: 6.3 mmol/L (ref 3.5–5.1)
SODIUM: 137 mmol/L (ref 135–145)
TCO2: 31 mmol/L (ref 22–32)

## 2018-07-16 LAB — GLUCOSE, CAPILLARY: Glucose-Capillary: 159 mg/dL — ABNORMAL HIGH (ref 70–99)

## 2018-07-16 LAB — PROTIME-INR
INR: 1.09
INR: 1.12
PROTHROMBIN TIME: 14 s (ref 11.4–15.2)
Prothrombin Time: 14.3 seconds (ref 11.4–15.2)

## 2018-07-16 LAB — I-STAT CG4 LACTIC ACID, ED
Lactic Acid, Venous: 2.32 mmol/L (ref 0.5–1.9)
Lactic Acid, Venous: 1.71 mmol/L (ref 0.5–1.9)

## 2018-07-16 LAB — I-STAT TROPONIN, ED: TROPONIN I, POC: 0.01 ng/mL (ref 0.00–0.08)

## 2018-07-16 LAB — POTASSIUM: Potassium: 5.4 mmol/L — ABNORMAL HIGH (ref 3.5–5.1)

## 2018-07-16 LAB — CBG MONITORING, ED: GLUCOSE-CAPILLARY: 164 mg/dL — AB (ref 70–99)

## 2018-07-16 LAB — CK: Total CK: 131 U/L (ref 49–397)

## 2018-07-16 MED ORDER — ALIROCUMAB 150 MG/ML ~~LOC~~ SOPN: 150.0000 mg | PEN_INJECTOR | SUBCUTANEOUS | Status: DC

## 2018-07-16 MED ORDER — METHIMAZOLE 5 MG PO TABS
2.5000 mg | ORAL_TABLET | Freq: Every day | ORAL | Status: DC
  Administered 2018-07-17 – 2018-07-23 (×7): 2.5 mg via ORAL
  Filled 2018-07-16 (×10): qty 1

## 2018-07-16 MED ORDER — METRONIDAZOLE IN NACL 5-0.79 MG/ML-% IV SOLN
500.0000 mg | Freq: Three times a day (TID) | INTRAVENOUS | Status: DC
  Administered 2018-07-16 – 2018-07-17 (×3): 500 mg via INTRAVENOUS
  Filled 2018-07-16 (×4): qty 100

## 2018-07-16 MED ORDER — ALLOPURINOL 100 MG PO TABS
100.0000 mg | ORAL_TABLET | Freq: Every day | ORAL | Status: DC
  Administered 2018-07-16 – 2018-07-23 (×7): 100 mg via ORAL
  Filled 2018-07-16 (×7): qty 1

## 2018-07-16 MED ORDER — CINACALCET HCL 30 MG PO TABS
90.0000 mg | ORAL_TABLET | Freq: Two times a day (BID) | ORAL | Status: DC
  Administered 2018-07-17 – 2018-07-23 (×11): 90 mg via ORAL
  Filled 2018-07-16 (×15): qty 3

## 2018-07-16 MED ORDER — VANCOMYCIN HCL IN DEXTROSE 1-5 GM/200ML-% IV SOLN
1000.0000 mg | INTRAVENOUS | Status: DC
  Administered 2018-07-17: 1000 mg via INTRAVENOUS
  Filled 2018-07-16: qty 200

## 2018-07-16 MED ORDER — DEXTROSE 5 % IV SOLN
0.5000 g | Freq: Two times a day (BID) | INTRAVENOUS | Status: DC
  Administered 2018-07-17: 0.5 g via INTRAVENOUS
  Filled 2018-07-16 (×3): qty 0.5

## 2018-07-16 MED ORDER — INSULIN ASPART 100 UNIT/ML ~~LOC~~ SOLN: 0.0000 [IU] | Freq: Every day | SUBCUTANEOUS | Status: DC

## 2018-07-16 MED ORDER — METRONIDAZOLE IN NACL 5-0.79 MG/ML-% IV SOLN: 500.0000 mg | Freq: Three times a day (TID) | INTRAVENOUS | Status: DC

## 2018-07-16 MED ORDER — RENA-VITE PO TABS
1.0000 | ORAL_TABLET | Freq: Every day | ORAL | Status: DC
  Administered 2018-07-16 – 2018-07-22 (×7): 1 via ORAL
  Filled 2018-07-16 (×7): qty 1

## 2018-07-16 MED ORDER — ACETAMINOPHEN 325 MG PO TABS
650.0000 mg | ORAL_TABLET | Freq: Four times a day (QID) | ORAL | Status: DC | PRN
  Administered 2018-07-17 – 2018-07-20 (×2): 650 mg via ORAL
  Filled 2018-07-16 (×3): qty 2

## 2018-07-16 MED ORDER — ACETAMINOPHEN 325 MG PO TABS
650.0000 mg | ORAL_TABLET | Freq: Once | ORAL | Status: AC
  Administered 2018-07-16: 650 mg via ORAL
  Filled 2018-07-16: qty 2

## 2018-07-16 MED ORDER — HYPROMELLOSE (GONIOSCOPIC) 2.5 % OP SOLN
1.0000 [drp] | Freq: Three times a day (TID) | OPHTHALMIC | Status: DC | PRN
  Filled 2018-07-16: qty 15

## 2018-07-16 MED ORDER — SODIUM CHLORIDE 0.9 % IV BOLUS
250.0000 mL | Freq: Once | INTRAVENOUS | Status: AC
  Administered 2018-07-16: 250 mL via INTRAVENOUS

## 2018-07-16 MED ORDER — HEPARIN SODIUM (PORCINE) 5000 UNIT/ML IJ SOLN
5000.0000 [IU] | Freq: Three times a day (TID) | INTRAMUSCULAR | Status: DC
  Administered 2018-07-16 – 2018-07-23 (×14): 5000 [IU] via SUBCUTANEOUS
  Filled 2018-07-16 (×15): qty 1

## 2018-07-16 MED ORDER — MIDODRINE HCL 5 MG PO TABS: 10.0000 mg | ORAL_TABLET | ORAL | Status: DC

## 2018-07-16 MED ORDER — SODIUM CHLORIDE 0.9 % IV BOLUS
500.0000 mL | Freq: Once | INTRAVENOUS | Status: AC
  Administered 2018-07-16: 500 mL via INTRAVENOUS

## 2018-07-16 MED ORDER — ASPIRIN EC 81 MG PO TBEC
81.0000 mg | DELAYED_RELEASE_TABLET | Freq: Every day | ORAL | Status: DC
  Administered 2018-07-17 – 2018-07-23 (×6): 81 mg via ORAL
  Filled 2018-07-16 (×6): qty 1

## 2018-07-16 MED ORDER — TRIAMCINOLONE ACETONIDE 0.5 % EX CREA: TOPICAL_CREAM | Freq: Two times a day (BID) | CUTANEOUS | Status: DC

## 2018-07-16 MED ORDER — GABAPENTIN 300 MG PO CAPS
600.0000 mg | ORAL_CAPSULE | Freq: Every day | ORAL | Status: DC
  Administered 2018-07-16 – 2018-07-22 (×7): 600 mg via ORAL
  Filled 2018-07-16 (×7): qty 2

## 2018-07-16 MED ORDER — EZETIMIBE 10 MG PO TABS
10.0000 mg | ORAL_TABLET | Freq: Every day | ORAL | Status: DC
  Administered 2018-07-16 – 2018-07-23 (×7): 10 mg via ORAL
  Filled 2018-07-16 (×7): qty 1

## 2018-07-16 MED ORDER — SACCHAROMYCES BOULARDII 250 MG PO CAPS
250.0000 mg | ORAL_CAPSULE | Freq: Two times a day (BID) | ORAL | Status: DC
  Administered 2018-07-16 – 2018-07-23 (×13): 250 mg via ORAL
  Filled 2018-07-16 (×14): qty 1

## 2018-07-16 MED ORDER — SODIUM CHLORIDE 0.9 % IV SOLN
1.0000 g | Freq: Once | INTRAVENOUS | Status: AC
  Administered 2018-07-16: 1 g via INTRAVENOUS
  Filled 2018-07-16: qty 1

## 2018-07-16 MED ORDER — FERRIC CITRATE 1 GM 210 MG(FE) PO TABS
420.0000 mg | ORAL_TABLET | Freq: Two times a day (BID) | ORAL | Status: DC
  Administered 2018-07-17 – 2018-07-23 (×11): 420 mg via ORAL
  Filled 2018-07-16 (×15): qty 2

## 2018-07-16 MED ORDER — INSULIN ASPART 100 UNIT/ML ~~LOC~~ SOLN
0.0000 [IU] | Freq: Three times a day (TID) | SUBCUTANEOUS | Status: DC
  Administered 2018-07-17 – 2018-07-23 (×5): 2 [IU] via SUBCUTANEOUS

## 2018-07-16 MED ORDER — ZOLPIDEM TARTRATE 5 MG PO TABS
5.0000 mg | ORAL_TABLET | Freq: Every evening | ORAL | Status: DC | PRN
  Administered 2018-07-19 – 2018-07-23 (×5): 5 mg via ORAL
  Filled 2018-07-16 (×5): qty 1

## 2018-07-16 MED ORDER — SODIUM CHLORIDE 0.9 % IV SOLN
2.0000 g | Freq: Once | INTRAVENOUS | Status: DC
  Filled 2018-07-16: qty 2

## 2018-07-16 MED ORDER — FERRIC CITRATE 1 GM 210 MG(FE) PO TABS
210.0000 mg | ORAL_TABLET | ORAL | Status: DC | PRN
  Filled 2018-07-16 (×2): qty 1

## 2018-07-16 MED ORDER — VANCOMYCIN HCL IN DEXTROSE 1-5 GM/200ML-% IV SOLN: 1000.0000 mg | Freq: Once | INTRAVENOUS | Status: DC

## 2018-07-16 MED ORDER — PANTOPRAZOLE SODIUM 40 MG PO TBEC
40.0000 mg | DELAYED_RELEASE_TABLET | Freq: Every evening | ORAL | Status: DC
  Administered 2018-07-16 – 2018-07-22 (×6): 40 mg via ORAL
  Filled 2018-07-16 (×6): qty 1

## 2018-07-16 MED ORDER — COLESEVELAM HCL 625 MG PO TABS
1875.0000 mg | ORAL_TABLET | Freq: Two times a day (BID) | ORAL | Status: DC
  Administered 2018-07-17 – 2018-07-23 (×11): 1875 mg via ORAL
  Filled 2018-07-16 (×15): qty 3

## 2018-07-16 MED ORDER — POLYETHYLENE GLYCOL 3350 17 G PO PACK: 17.0000 g | PACK | Freq: Every day | ORAL | Status: DC | PRN

## 2018-07-16 MED ORDER — VANCOMYCIN HCL 10 G IV SOLR
2000.0000 mg | Freq: Once | INTRAVENOUS | Status: AC
  Administered 2018-07-16: 2000 mg via INTRAVENOUS
  Filled 2018-07-16: qty 2000

## 2018-07-16 MED ORDER — CITALOPRAM HYDROBROMIDE 10 MG PO TABS
10.0000 mg | ORAL_TABLET | Freq: Every day | ORAL | Status: DC
  Administered 2018-07-16 – 2018-07-22 (×7): 10 mg via ORAL
  Filled 2018-07-16 (×7): qty 1

## 2018-07-16 NOTE — ED Provider Notes (Signed)
[facility] EMERGENCY DEPARTMENT Provider Note   CSN: [id number] Arrival date & time: 07/16/18  1316     History   Chief Complaint Chief Complaint  Patient presents with  . Weakness    HPI [name] is a 67 y.o. male.  HPI  67 year old male presents with weakness and fatigue.  Started last night.  Patient states he just did not feel well and was achy all over.  However it was worst in his back.  This is across his back diffusely.  When he woke up he felt the same.  Went to his cardiology office but he was so lethargic and having difficulty standing up that he was sent here.  There is no unilateral weakness.  No headache.  He has not been having vomiting, cough, shortness of breath, chest pain or abdominal pain.  He states he still feels generally achy but it is still worse in his back.  He last went to dialysis yesterday.  He does not make urine.  Past Medical History:  Diagnosis Date  . Allergic rhinitis, cause unspecified 02/24/2014  . Anemia 06/16/2011  . BENIGN PROSTATIC HYPERTROPHY 10/14/2009  . CAD, NATIVE VESSEL 02/06/2009   a. 06/2007 s/p Taxus DES to the RCA;  b. 08/2016 NSTEMI in setting of SVT/PCI: LM 30ost, LAD 41m (3.0x16 Synergy DES), LCX 81m, OM1 60, RI 40, RCA 70p/m, 1m - not amenable to PCI.  [name] Cervical radiculopathy, chronic 02/23/2016   Right c5-6 by NCS/EMG  . Chronic combined systolic (congestive) and diastolic (congestive) heart failure ([place])    a. 10/2016 Echo: EF 40-45%, Gr2 DD. mildly dil LA.  [name] COLONIC POLYPS, HX OF 10/14/2009  . Dementia ([place]) [id number]  . Depression 09/24/2015  . DIABETES MELLITUS, TYPE II 02/01/2010  . DYSLIPIDEMIA 06/18/2007  . ESRD (end stage renal disease) on dialysis ([place]) 08/04/2010   "TTS;  " (04/18/2015)  . FOOT PAIN 08/12/2008  . GAIT DISTURBANCE 03/03/2010  . GASTROENTERITIS, VIRAL 10/14/2009  . GERD 06/18/2007  . GOITER, MULTINODULAR 12/26/2007  . GOUT 06/18/2007  . GYNECOMASTIA 07/17/2010  . Hemodialysis  access, fistula mature [facility])    Dialysis T-Th-Sa ([place]) Right upper arm fistula  . Hyperlipidemia 10/16/2011  . Hyperparathyroidism, secondary ([place]) 06/16/2011  . HYPERTENSION 06/18/2007  . Hyperthyroidism   . Hypocalcemia 06/07/2010  . Ischemic cardiomyopathy    a. 10/2016 Echo: EF 40-45%.  . Lumbar stenosis with neurogenic claudication   . ONYCHOMYCOSIS, TOENAILS 12/26/2007  . OSA on CPAP 10/16/2011  . Other malaise and fatigue 11/24/2009  . PERIPHERAL NEUROPATHY 06/18/2007  . Prostate cancer ([place])   . PSVT (paroxysmal supraventricular tachycardia) ([place])    a. [id number]/9379 complicated by NSTEMI;  b. 11/2016 Treated w/ adenosine in ED;  c. 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  [name] PULMONARY NODULE, RIGHT LOWER LOBE 06/08/2009  . Sleep apnea    cpap machine and o2  . TRANSAMINASES, SERUM, ELEVATED 02/01/2010  . Transfusion history    none recent  . Unspecified hypotension 01/30/2010    Patient Active Problem List   Diagnosis Date Noted  . Sepsis ([place]) 07/16/2018  . Acute conjunctivitis of both eyes 06/21/2018  . Thrush 06/21/2018  . HCAP (healthcare-associated pneumonia) 06/11/2018  . GI bleed 06/11/2018  . Recurrent falls 05/14/2018  . Chronic combined systolic (congestive) and diastolic (congestive) heart failure ([place]) 01/15/2018  . Abscess of left leg 10/14/2017  . Insomnia 10/14/2017  . Lumbar stenosis with neurogenic claudication 07/31/2017  . Lightheadedness   .  Rectal bleeding   . Acute pulmonary edema ([name])   . Gait disorder 05/05/2017  . Chronic GI bleeding 04/22/2017  . Acute GI bleeding 04/22/2017  . Weakness 12/27/2016  . Generalized weakness 12/27/2016  . History of PSVT (paroxysmal supraventricular tachycardia) 10/16/2016  . Abdominal pain 08/27/2016  . Abnormal findings on diagnostic imaging of liver and biliary tract 08/27/2016  . Bloating symptom 08/27/2016  . Hematochezia 08/27/2016  . Early satiety 08/27/2016  . Eructation 08/27/2016  . Esophageal ulcer  08/27/2016  . Flatulence, eructation and gas pain 08/27/2016  . General weakness 08/27/2016  . Skin sensation disturbance 08/27/2016  . Stricture of esophagus 08/27/2016  . Alternating constipation and diarrhea 08/16/2016  . Status post coronary artery stent placement   . ST segment depression 08/05/2016  . PAD (peripheral artery disease) ([place]) 06/18/2016  . Hypotension 04/29/2016  . Cervical radiculopathy, chronic 02/23/2016  . Memory loss 12/22/2015  . Left-sided weakness 12/22/2015  . Peripheral polyneuropathy 12/22/2015  . DM (diabetes mellitus) type II controlled with renal manifestation ([place]) 10/31/2015  . Nausea and vomiting 10/31/2015  . Malignant neoplasm of prostate ([place]) 10/24/2015  . Right leg pain 09/04/2015  . Chest pain 08/09/2015  . Acute encephalopathy 08/09/2015  . S/P right total knee replacement 08/09/2015  . Primary osteoarthritis of right knee 08/05/2015  . Neuropathy due to secondary diabetes mellitus ([place]) 08/05/2015  . Arthritis of knee 08/02/2015  . Headache 06/08/2015  . Melena 04/19/2015  . Acute blood loss anemia 04/18/2015  . Hyperthyroidism 03/11/2015  . Thigh pain 12/08/2014  . Nodule of right lung 09/14/2014  . PSA elevation 08/30/2014  . ESRD on hemodialysis ([place]) 08/16/2014  . Essential hypertension 08/16/2014  . Pre-transplant evaluation for kidney transplant 08/16/2014  . Allergic rhinitis 02/24/2014  . Elevated PSA 02/24/2014  . Vertigo 10/14/2013  . Cough 06/26/2013  . Numbness 05/15/2013  . Other complications due to renal dialysis device, implant, and graft 01/14/2013  . Right hand pain 03/28/2012  . Dysphagia 03/28/2012  . Hypogonadism, male 03/28/2012  . OSA (obstructive sleep apnea) 10/16/2011  . HLD (hyperlipidemia) 10/16/2011  . CAD S/P percutaneous coronary angioplasty 09/28/2011  . NSTEMI (non-ST elevated myocardial infarction) ([place]) 09/28/2011  . AVM (arteriovenous malformation) 09/27/2011  . Anemia associated with acute  blood loss 09/26/2011  . Ischemic cardiomyopathy 06/16/2011  . Hyperparathyroidism, secondary ([place]) 06/16/2011  . Preventative health care 03/11/2011  . NECK PAIN 07/31/2010  . GYNECOMASTIA 07/17/2010  . DIZZINESS 07/17/2010  . GAIT DISTURBANCE 03/03/2010  . Thyrotoxicosis 02/02/2010  . Elevated levels of transaminase & lactic acid dehydrogenase 02/01/2010  . Other malaise and fatigue 11/24/2009  . Depression 10/14/2009  . BENIGN PROSTATIC HYPERTROPHY 10/14/2009  . COLONIC POLYPS, HX OF 10/14/2009  . Dementia ([place]) 09/02/2009  . PULMONARY NODULE, RIGHT LOWER LOBE 06/08/2009  . DYSPNEA 10/29/2008  . FOOT PAIN 08/12/2008  . ONYCHOMYCOSIS, TOENAILS 12/26/2007  . GOITER, MULTINODULAR 12/26/2007  . Gout 06/18/2007  . Neuropathy ([place]) 06/18/2007  . Hypertensive heart disease with CHF (congestive heart failure) ([place]) 06/18/2007  . Gastro-esophageal reflux disease without esophagitis 06/18/2007    Past Surgical History:  Procedure Laterality Date  . A/V SHUNTOGRAM N/A 04/07/2018   Procedure: A/V SHUNTOGRAM;  Surgeon: Waynetta Sandy, MD;  Location: [facility] CV LAB;  Service: Cardiovascular;  Laterality: N/A;  . ARTERIOVENOUS GRAFT PLACEMENT Right 2009   forearm/notes 02/01/2011  . AV FISTULA PLACEMENT  11/07/2011   Procedure: INSERTION OF ARTERIOVENOUS (AV) GORE-TEX GRAFT ARM;  Surgeon: Tinnie Gens, MD;  Location: [facility] OR;  Service: Vascular;  Laterality: Left;  . BACK SURGERY  1998  . BASCILIC VEIN TRANSPOSITION Right 02/27/2013   Procedure: BASCILIC VEIN TRANSPOSITION;  Surgeon: Mal Misty, MD;  Location: [facility];  Service: Vascular;  Laterality: Right;  Right Basilic Vein Transposition   . CARDIAC CATHETERIZATION N/A 08/06/2016   Procedure: Left Heart Cath and Coronary Angiography;  Surgeon: Jolaine Artist, MD;  Location: [facility] CV LAB;  Service: Cardiovascular;  Laterality: N/A;  . CARDIAC CATHETERIZATION N/A 08/07/2016   Procedure: Coronary/Graft Atherectomy-CSI  LAD;  Surgeon: Peter M Martinique, MD;  Location: [facility] CV LAB;  Service: Cardiovascular;  Laterality: N/A;  . CERVICAL SPINE SURGERY  2/09   "to repair nerve problems in my left arm"  . CHOLECYSTECTOMY    . COLONOSCOPY WITH PROPOFOL N/A 04/26/2017   Procedure: COLONOSCOPY WITH PROPOFOL;  Surgeon: Otis Brace, MD;  Location: [facility];  Service: Gastroenterology;  Laterality: N/A;  . CORONARY ANGIOPLASTY WITH STENT PLACEMENT  06/11/2008  . CORONARY ANGIOPLASTY WITH STENT PLACEMENT  06/2007   TAXUS stent to RCA/notes 01/31/2011  . ESOPHAGOGASTRODUODENOSCOPY  09/28/2011   Procedure: ESOPHAGOGASTRODUODENOSCOPY (EGD);  Surgeon: Missy Sabins, MD;  Location: [facility] ENDOSCOPY;  Service: Endoscopy;  Laterality: N/A;  . ESOPHAGOGASTRODUODENOSCOPY N/A 04/07/2015   Procedure: ESOPHAGOGASTRODUODENOSCOPY (EGD);  Surgeon: Teena Irani, MD;  Location: [facility];  Service: Endoscopy;  Laterality: N/A;  . ESOPHAGOGASTRODUODENOSCOPY N/A 04/19/2015   Procedure: ESOPHAGOGASTRODUODENOSCOPY (EGD);  Surgeon: Arta Silence, MD;  Location[facility];  Service: Endoscopy;  Laterality: N/A;  . ESOPHAGOGASTRODUODENOSCOPY (EGD) WITH PROPOFOL N/A 06/13/2018   Procedure: ESOPHAGOGASTRODUODENOSCOPY (EGD) WITH PROPOFOL;  Surgeon: Wonda Horner, MD;  Location: [facility] ENDOSCOPY;  Service: Endoscopy;  Laterality: N/A;  . FLEXIBLE SIGMOIDOSCOPY N/A 05/21/2017   Procedure: Conni Elliot;  Surgeon: Clarene Essex, MD;  Location: [facility];  Service: Endoscopy;  Laterality: N/A;  . FLEXIBLE SIGMOIDOSCOPY Left 07/02/2017   Procedure: FLEXIBLE SIGMOIDOSCOPY;  Surgeon: Laurence Spates, MD;  Location: [facility];  Service: Endoscopy;  Laterality: Left;  . FOREIGN BODY REMOVAL  09/2003   via upper endoscopy/notes 02/12/2011  . GIVENS CAPSULE STUDY  09/30/2011   Procedure: GIVENS CAPSULE STUDY;  Surgeon: Jeryl Columbia, MD;  Location: [facility] ENDOSCOPY;  Service: Endoscopy;  Laterality: N/A;  . INSERTION OF DIALYSIS CATHETER Right  2014  . INSERTION OF DIALYSIS CATHETER Left 02/11/2013   Procedure: INSERTION OF DIALYSIS CATHETER;  Surgeon: Conrad Jagual, MD;  Location: [facility];  Service: Vascular;  Laterality: Left;  Ultrasound guided  . LUMBAR LAMINECTOMY/DECOMPRESSION MICRODISCECTOMY Bilateral 07/31/2017   Procedure: LAMINECTOMY AND FORAMINOTOMY- BILATERAL LUMBAR TWO- LUMBAR THREE;  Surgeon: Earnie Larsson, MD;  Location: [place];  Service: Neurosurgery;  Laterality: Bilateral;  LAMINECTOMY AND FORAMINOTOMY- BILATERAL LUMBAR 2- LUMBAR 3  . PERIPHERAL VASCULAR BALLOON ANGIOPLASTY  04/07/2018   Procedure: PERIPHERAL VASCULAR BALLOON ANGIOPLASTY;  Surgeon: Waynetta Sandy, MD;  Location: [facility] CV LAB;  Service: Cardiovascular;;  RUE AVF  . REMOVAL OF A DIALYSIS CATHETER Right 02/11/2013   Procedure: REMOVAL OF A DIALYSIS CATHETER;  Surgeon: Conrad Liberal, MD;  Location: [facility];  Service: Vascular;  Laterality: Right;  . SAVORY DILATION N/A 04/07/2015   Procedure: SAVORY DILATION;  Surgeon: Teena Irani, MD;  Location: [facility] ENDOSCOPY;  Service: Endoscopy;  Laterality: N/A;  . SHUNTOGRAM N/A 09/20/2011   Procedure: Earney Mallet;  Surgeon: Conrad Casey, MD;  Location: [facility] CATH LAB;  Service: Cardiovascular;  Laterality: N/A;  . SVT ABLATION N/A 11/26/2016  Procedure: SVT Ablation;  Surgeon: Will Meredith Leeds, MD;  Location: [facility] CV LAB;  Service: Cardiovascular;  Laterality: N/A;  . TONSILLECTOMY    . TOTAL KNEE ARTHROPLASTY Right 08/02/2015   Procedure: TOTAL KNEE ARTHROPLASTY;  Surgeon: Renette Butters, MD;  Location: [facility];  Service: Orthopedics;  Laterality: Right;  . VENOGRAM N/A 01/26/2013   Procedure: VENOGRAM;  Surgeon: Angelia Mould, MD;  Location: [facility] CATH LAB;  Service: Cardiovascular;  Laterality: N/A;        Home Medications    Prior to Admission medications   Medication Sig Start Date End Date Taking? Authorizing Provider  acetaminophen (TYLENOL) 325 MG tablet Take 650 mg by mouth every 6 (six)  hours as needed for mild pain or headache.    Yes [provider]  Alirocumab (PRALUENT) 150 MG/ML SOPN Inject 150 mg into the skin every 14 (fourteen) days. 03/05/18  Yes Lorretta Harp, MD  allopurinol (ZYLOPRIM) 100 MG tablet TAKE ONE TABLET BY MOUTH ONCE DAILY 10/14/17  Yes Biagio Borg, MD  Amino Acids-Protein Hydrolys (FEEDING SUPPLEMENT, PRO-STAT SUGAR FREE 64,) LIQD Take 30 mLs by mouth 2 (two) times daily. 06/15/18  Yes Regalado, Belkys A, MD  aspirin EC 81 MG tablet Take 81 mg by mouth daily.   Yes [provider]  betamethasone dipropionate (DIPROLENE) 0.05 % cream Apply 1 application topically daily as needed (leg sores).   Yes [provider]  citalopram (CELEXA) 10 MG tablet Take 1 tablet (10 mg total) by mouth at bedtime. 10/14/17  Yes Biagio Borg, MD  ezetimibe (ZETIA) 10 MG tablet TAKE ONE TABLET BY MOUTH ONCE DAILY 04/08/17  Yes Bensimhon, Shaune Pascal, MD  ferric citrate (AURYXIA) 1 GM 210 MG(Fe) tablet Take 210-420 mg by mouth See admin instructions. Take 420 mg by mouth two times a day and 210 mg with each snack   Yes [provider]  gabapentin (NEURONTIN) 300 MG capsule Take 1 capsule (300 mg total) by mouth at bedtime. Patient taking differently: Take 600 mg by mouth at bedtime.  07/05/17  Yes Domenic Polite, MD  hydroxypropyl methylcellulose / hypromellose (ISOPTO TEARS / GONIOVISC) 2.5 % ophthalmic solution Place 1 drop into both eyes 3 (three) times daily as needed for dry eyes. 05/03/17  Yes Biagio Borg, MD  methimazole (TAPAZOLE) 5 MG tablet Take 0.5 tablets (2.5 mg total) by mouth daily. 05/16/18  Yes Renato Shin, MD  multivitamin (RENA-VIT) TABS tablet Take 1 tablet by mouth daily. Reported on 10/24/2015   Yes [provider]  pantoprazole (PROTONIX) 40 MG tablet Take 40 mg by mouth every evening.  08/31/16  Yes [provider]  polyethylene glycol (MIRALAX / GLYCOLAX) packet Take 17 g by mouth daily. Patient taking  differently: Take 17 g by mouth daily as needed for mild constipation.  05/23/17  Yes Florencia Reasons, MD  SENSIPAR 90 MG tablet Take 90 mg by mouth 2 (two) times daily. 09/18/16  Yes [provider]  WELCHOL 625 MG tablet TAKE THREE TABLETS BY MOUTH TWICE DAILY WITH MEALS 10/04/17  Yes Biagio Borg, MD  zolpidem (AMBIEN) 5 MG tablet Take 1 tablet (5 mg total) by mouth at bedtime as needed. for sleep 06/20/18  Yes Biagio Borg, MD  gabapentin (NEURONTIN) 300 MG capsule TAKE 1 TO 2 CAPSULES BY MOUTH AT BEDTIME AS NEEDED FOR PAIN AND SLEEP Patient not taking: Reported on 07/16/2018 06/27/18   Biagio Borg, MD  midodrine (PROAMATINE) 10 MG  tablet Take 1 tablet (10 mg total) by mouth See admin instructions. Once a day only on dialysis days (Tues/Thurs/Sat) Patient not taking: Reported on 07/16/2018 06/15/18   Regalado, Jerald Kief A, MD  saccharomyces boulardii (FLORASTOR) 250 MG capsule Take 1 capsule (250 mg total) by mouth 2 (two) times daily. Patient not taking: Reported on 07/16/2018 06/15/18   Elmarie Shiley, MD    Family History Family History  Problem Relation Age of Onset  . Heart disease Sister   . Thyroid nodules Sister   . Heart disease Father   . Diabetes Father   . Kidney failure Father   . Hypertension Father   . Healthy Child   . Healthy Child   . Healthy Child   . Cancer Neg Hx     Social History Social History   Tobacco Use  . Smoking status: Former Smoker    Packs/day: 1.00    Years: 25.00    Pack years: 25.00    Types: Cigarettes    Last attempt to quit: 10/01/2005    Years since quitting: 12.7  . Smokeless tobacco: Former Systems developer    Quit date: 10/01/2005  . Tobacco comment: Quit smoking 2007 Smoked x 25 years 1/2 ppd.  Substance Use Topics  . Alcohol use: No    Alcohol/week: 0.0 standard drinks  . Drug use: No     Allergies   Cephalexin; Statins; and Ciprofloxacin   Review of Systems Review of Systems  Constitutional: Positive for fatigue. Negative for  fever.  Respiratory: Negative for cough and shortness of breath.   Cardiovascular: Negative for chest pain.  Gastrointestinal: Negative for abdominal pain and vomiting.  Musculoskeletal: Positive for back pain and myalgias.  Neurological: Positive for weakness. Negative for headaches.  All other systems reviewed and are negative.    Physical Exam Updated Vital Signs BP 119/68 (BP Location: Left Arm)   Pulse 83   Temp 98.4 F (36.9 C) (Oral)   Resp 16   Ht 6\' 1"  (1.854 m)   Wt 100.7 kg   SpO2 95%   BMI 29.29 kg/m   Physical Exam  Constitutional: He is oriented to person, place, and time. He appears well-developed and well-nourished. No distress.  HENT:  Head: Normocephalic and atraumatic.  Right Ear: External ear normal.  Left Ear: External ear normal.  Nose: Nose normal.  Eyes: Right eye exhibits no discharge. Left eye exhibits no discharge.  Neck: Normal range of motion. Neck supple.  Cardiovascular: Normal rate, regular rhythm and normal heart sounds.  Pulmonary/Chest: Effort normal and breath sounds normal. He has no wheezes. He has no rales.  Abdominal: Soft. There is no tenderness.  Musculoskeletal: He exhibits no edema.       Thoracic back: He exhibits tenderness.       Back:  Neurological: He is alert and oriented to person, place, and time.  Awake, alert, oriented. 5/5 strength in all 4 extremities.   Skin: Skin is warm and dry. He is not diaphoretic.  Nursing note and vitals reviewed.    ED Treatments / Results  Labs (all labs ordered are listed, but only abnormal results are displayed) Labs Reviewed  CBC - Abnormal; Notable for the following components:      Result Value   RBC 4.03 (*)    Hemoglobin 12.2 (*)    MCV 102.0 (*)    MCHC 29.7 (*)    RDW 20.1 (*)    All other components within normal limits  DIFFERENTIAL - Abnormal;  Notable for the following components:   Neutro Abs 8.0 (*)    All other components within normal limits  COMPREHENSIVE  METABOLIC PANEL - Abnormal; Notable for the following components:   Potassium 5.2 (*)    Glucose, Bld 196 (*)    BUN 41 (*)    Creatinine, Ser 10.02 (*)    Calcium 8.8 (*)    Albumin 3.3 (*)    AST 193 (*)    ALT 231 (*)    Alkaline Phosphatase 230 (*)    Total Bilirubin 2.8 (*)    GFR calc non Af Amer 5 (*)    GFR calc Af Amer 5 (*)    All other components within normal limits  CBG MONITORING, ED - Abnormal; Notable for the following components:   Glucose-Capillary 164 (*)    All other components within normal limits  I-STAT CHEM 8, ED - Abnormal; Notable for the following components:   Potassium 6.3 (*)    BUN 61 (*)    Creatinine, Ser 10.70 (*)    Glucose, Bld 194 (*)    Calcium, Ion 0.93 (*)    All other components within normal limits  I-STAT CG4 LACTIC ACID, ED - Abnormal; Notable for the following components:   Lactic Acid, Venous 2.32 (*)    All other components within normal limits  CULTURE, BLOOD (ROUTINE X 2)  CULTURE, BLOOD (ROUTINE X 2)  PROTIME-INR  APTT  PROTIME-INR  APTT  CBC  HEMOGLOBIN A1C  CK  POTASSIUM  HEPATITIS PANEL, ACUTE  COMPREHENSIVE METABOLIC PANEL  I-STAT TROPONIN, ED  CBG MONITORING, ED  I-STAT CG4 LACTIC ACID, ED    EKG EKG Interpretation  Date/Time:  Wednesday July 16 2018 13:22:10 EDT Ventricular Rate:  107 PR Interval:  162 QRS Duration: 98 QT Interval:  374 QTC Calculation: 499 R Axis:   52 Text Interpretation:  Sinus tachycardia Otherwise normal ECG nonspecific T waves improved since Sept 2019 Confirmed by Sherwood Gambler [phone]) on 07/16/2018 1:53:19 PM   Radiology Ct Abdomen Pelvis Wo Contrast  Result Date: 07/16/2018 CLINICAL DATA:  Abdominal pain with fever.  Confusion and weakness. EXAM: CT ABDOMEN AND PELVIS WITHOUT CONTRAST TECHNIQUE: Multidetector CT imaging of the abdomen and pelvis was performed following the standard protocol without IV contrast. COMPARISON:  04/22/2017 FINDINGS: Lower chest: Cardiomegaly  with coronary arteriosclerosis and aortic atherosclerosis. No active pulmonary disease. Mild bronchiectasis in the right lower lobe. Hepatobiliary: Pneumobilia is redemonstrated presumably from prior intervention. Cholecystectomy clips are present. The unenhanced liver is unremarkable given limitations of a noncontrast study. No definite mass identified. Pancreas: Unremarkable. No pancreatic ductal dilatation or surrounding inflammatory changes. Spleen: Normal in size without focal abnormality. Adrenals/Urinary Tract: Atrophic bilateral kidneys with stable cysts. Normal bilateral adrenal glands. Renovascular calcifications are identified. A few nonobstructing bilateral renal calculi also present. No hydroureteronephrosis. Decompressed urinary bladder. Stomach/Bowel: The stomach is decompressed in appearance. The duodenal sweep and ligament of Treitz are normal. No small bowel obstruction or inflammation. Increased fecal retention within the colon with scattered colonic diverticulosis without acute diverticulitis. Normal-appearing appendix. Vascular/Lymphatic: Moderate aortoiliac branch vessel atherosclerosis with mild ectasia of the common iliac arteries up to 19 mm in diameter on right and 13 mm on left. Nonaneurysmal thoracic aorta. No lymphadenopathy. Reproductive: Normal size prostate. Surgical clips are noted projecting posterior to prostate gland. Other: No abscess, free air nor abdominopelvic ascites. Musculoskeletal: Degenerative disc disease L1-2 and L2-3 with evidence of prior posterior lumbar interbody fusion at L4-5. Interbody blocks noted at  L3-4. IMPRESSION: 1. Cardiomegaly with coronary arteriosclerosis. 2. Aortoiliac atherosclerosis with ectasia of the common iliac arteries. No abdominal aortic aneurysm. 3. Atrophic kidneys with bilateral stable appearing renal cysts and nonobstructing bilateral renal calculi. 4. Status post cholecystectomy with biliary redemonstrated likely from prior intervention.  5. Thoracolumbar spondylosis with evidence of prior posterior lumbar fusion at L4-5 with well incorporated interbody block and interbody disc placed at L3-4. Electronically Signed   By: Ashley Royalty M.D.   On: 07/16/2018 16:28   Dg Chest 2 View  Result Date: 07/16/2018 CLINICAL DATA:  67 year old male with fever since last night. Right flank pain. EXAM: CHEST - 2 VIEW COMPARISON:  Chest radiographs 06/10/2018 and earlier. FINDINGS: Stable lung volumes but resolved patchy medial right lung base opacity since September. Stable cardiac size and mediastinal contours. Calcified aortic atherosclerosis. Visualized tracheal air column is within normal limits. No pneumothorax, pulmonary edema, pleural effusion or confluent pulmonary opacity today. Partially visible cervical ACDF. Right axillary vascular stent redemonstrated. No acute osseous abnormality identified. IMPRESSION: 1. No acute cardiopulmonary abnormality. Resolved right lung base opacity since September. 2.  Aortic Atherosclerosis (ICD10-I70.0). Electronically Signed   By: Genevie Ann M.D.   On: 07/16/2018 16:02   Ct Head Wo Contrast  Result Date: 07/16/2018 CLINICAL DATA:  Weakness since this morning EXAM: CT HEAD WITHOUT CONTRAST TECHNIQUE: Contiguous axial images were obtained from the base of the skull through the vertex without intravenous contrast. COMPARISON:  06/11/2018 FINDINGS: Brain: Mild chronic ischemic changes in the periventricular white matter. Mild global atrophy. No mass effect, midline shift, or acute intracranial hemorrhage. Vascular: No hyperdense vessel or unexpected calcification. Skull: Cranium is intact. Sinuses/Orbits: Mastoid air cells are clear. Minimal mucosal thickening in the sphenoid, frontal, and ethmoid sinus. Orbits are within normal limits. Other: Noncontributory. IMPRESSION: No acute intracranial pathology. Electronically Signed   By: Marybelle Killings M.D.   On: 07/16/2018 14:14   Mr Thoracic Spine Wo Contrast  Result  Date: 07/16/2018 CLINICAL DATA:  Initial evaluation for fever of unknown origin, inability to lift legs. EXAM: MRI THORACIC AND LUMBAR SPINE WITHOUT CONTRAST TECHNIQUE: Multiplanar and multiecho pulse sequences of the thoracic and lumbar spine were obtained without intravenous contrast. COMPARISON:  Comparison made with prior MRI from 06/28/2017. FINDINGS: MRI THORACIC SPINE FINDINGS Alignment: Vertebral bodies normally aligned with preservation of the normal thoracic kyphosis. No listhesis. Vertebrae: Vertebral body heights maintained without evidence for acute or chronic fracture. Bone marrow signal intensity within normal limits. Multilevel discogenic reactive endplate changes seen throughout the thoracic spine, most notable at T2-3, T5-6, and T10-11. No discrete or worrisome osseous lesions. No findings to suggest discitis. Reactive marrow edema about the right T26-71 facet almost certainly degenerative in nature (series 18, image 5). No other abnormal marrow edema. Cord: Question faint cord signal abnormality within the thoracic spinal cord at the level of T10-11 (series 15, image 9). Signal intensity within the thoracic spinal cord is otherwise within normal limits. No epidural collections. Paraspinal and other soft tissues: Paraspinous soft tissues demonstrate no acute finding. Partially visualized lungs are grossly clear. Aortic atherosclerosis noted. Disc levels: Underlying congenital spinal stenosis noted. Postsurgical changes noted within the cervical spine on counter sequence. T1-2: Bilateral facet hypertrophy. No spinal stenosis. Mild bilateral foraminal narrowing. T2-3: Diffuse disc bulge with bilateral facet hypertrophy. Mild spinal stenosis. Moderate left with mild right foraminal narrowing. T3-4: Mild disc bulge. Mild facet hypertrophy. No significant stenosis. Foramina remain patent. T4-5: Mild disc bulge. Mild facet hypertrophy. No significant stenosis. Foramina  remain patent. T5-6: Diffuse disc  bulge with prominent reactive endplate changes. Facet and ligament flavum hypertrophy. Mild spinal stenosis. Mild right neural foraminal narrowing. T6-7: Diffuse disc bulge. Left greater than right facet hypertrophy. No significant stenosis. T7-8: Diffuse disc bulge. Reactive endplate changes. Bilateral facet hypertrophy. No significant spinal stenosis. Foramina remain patent. T8-9: Diffuse disc bulge, asymmetric to the left. Prominent reactive endplate changes. Facet hypertrophy. Resultant mild canal stenosis. Mild to moderate bilateral foraminal narrowing. T9-10: Diffuse disc bulge with bilateral facet hypertrophy. Mild spinal stenosis. Mild bilateral foraminal narrowing. T10-11: Diffuse disc bulge with disc desiccation and reactive endplate changes. Bilateral facet hypertrophy. Mild epidural lipomatosis. Resultant moderate spinal stenosis possible faint cord signal abnormality at this level (series 15, image 9). Moderate to severe bilateral foraminal narrowing. T11-12: Diffuse disc bulge with disc desiccation and intervertebral disc space narrowing. Right greater than left facet hypertrophy. Reactive edema about the right T11-12 facet likely degenerative. Resultant moderate spinal stenosis. No definite cord signal changes. Moderate to severe bilateral foraminal narrowing. T12-L1: Mild disc bulge. Bilateral facet hypertrophy. Mild spinal stenosis. Mild to moderate bilateral foraminal narrowing. MRI LUMBAR SPINE FINDINGS Segmentation: Normal segmentation. Lowest well-formed disc labeled the L5-S1 level. Alignment: 4 mm retrolisthesis of L5 on S1. Straightening of the normal lumbar lordosis. Vertebrae: Patient status post interbody fusion at L3-4 and L4-5, with additional posterior fusion at L4-5. Solid arthrodesis at the L4-5 level. Prior posterior decompression has also been performed at L2-3 and L3-4. Vertebral body heights maintained without acute or chronic fracture. Bone marrow signal intensity within normal  limits. No worrisome osseous lesions. Prominent reactive endplate changes about the L1-2 through L3-4 interspaces. No other abnormal marrow edema. Conus medullaris and cauda equina: Conus extends to the L1 level. Conus and cauda equina appear normal. Paraspinal and other soft tissues: Remote postsurgical changes present within the posterior paraspinous soft tissues. Mild increased STIR signal intensity within the lower posterior paraspinous soft tissues bilaterally, likely due to susceptibility artifact, similar to previous exam. No soft tissue collections. Bilateral renal atrophy with innumerable renal cysts noted. Intra-abdominal aorta ectatic measuring up to 2.7 cm. Disc levels: Underlying congenital spinal stenosis noted. L1-2: Diffuse disc bulge with disc desiccation and intervertebral disc space narrowing. Reactive endplate changes with endplate osteophytic spurring. Moderate facet hypertrophy. Resultant moderate spinal stenosis, stable from previous. Mild to moderate bilateral L1 foraminal narrowing, also unchanged. L2-3: Diffuse disc bulge with disc desiccation and intervertebral disc space narrowing. Reactive endplate changes with marginal endplate osteophytic spurring. Prior posterior decompression. Improved moderate spinal stenosis with the thecal sac measuring 9 mm in AP diameter, previously 6 mm. Residual bilateral facet hypertrophy. Moderate right with moderate to severe left L2 foraminal narrowing, similar to previous. L3-4: Prior interbody fusion. Intervertebral disc space narrowing with endplate osseous ridging. Prior posterior decompression. Residual moderate to severe spinal stenosis with the thecal sac measuring 7 mm in AP diameter, stable from previous. Moderate bilateral L3 foraminal narrowing, also unchanged. L4-5: Prior posterior and interbody fusion with posterior decompression. Wide patency of the thecal sac. No significant foraminal narrowing. Appearance is stable. L5-S1: Mild  retrolisthesis. Diffuse disc bulge with intervertebral disc space narrowing and disc desiccation. Moderate facet hypertrophy. Resultant mild to moderate left lateral recess narrowing without significant canal stenosis, stable. Moderate right with severe left L5 foraminal narrowing, also unchanged. IMPRESSION: MR THORACIC SPINE IMPRESSION 1. Multifactorial degenerative changes at T10-11 with resultant moderate to severe spinal stenosis. Question faint cord signal abnormality at this level, which could reflect edema and/or myelomalacia.  2. Disc bulge with facet hypertrophy at T11-12 with resultant moderate spinal stenosis without cord signal changes. 3. Additional multilevel acquired on congenital canal and foraminal stenosis as detailed above, most notable at T2-3, T8-9, and T9-10. 4. Mild reactive edema about the right T11-12 facet, felt to almost certainly be degenerative in nature. MR LUMBAR SPINE IMPRESSION 1. No acute abnormality within the lumbar spine. 2. Postoperative changes from prior interbody and posterior fusion with decompression at L2-3 through L5-S1 as above. Improved moderate spinal stenosis at L2-3, with unchanged moderate to severe spinal stenosis at L3-4. No residual stenosis at L4-5. 3. Multifactorial degenerative changes with resultant moderate to severe bilateral foraminal narrowing throughout the lumbar spine as above, most notable at L2, L3, and L5 on the left. Electronically Signed   By: Jeannine Boga M.D.   On: 07/16/2018 20:48   Mr Lumbar Spine Wo Contrast  Result Date: 07/16/2018 CLINICAL DATA:  Initial evaluation for fever of unknown origin, inability to lift legs. EXAM: MRI THORACIC AND LUMBAR SPINE WITHOUT CONTRAST TECHNIQUE: Multiplanar and multiecho pulse sequences of the thoracic and lumbar spine were obtained without intravenous contrast. COMPARISON:  Comparison made with prior MRI from 06/28/2017. FINDINGS: MRI THORACIC SPINE FINDINGS Alignment: Vertebral bodies  normally aligned with preservation of the normal thoracic kyphosis. No listhesis. Vertebrae: Vertebral body heights maintained without evidence for acute or chronic fracture. Bone marrow signal intensity within normal limits. Multilevel discogenic reactive endplate changes seen throughout the thoracic spine, most notable at T2-3, T5-6, and T10-11. No discrete or worrisome osseous lesions. No findings to suggest discitis. Reactive marrow edema about the right T73-22 facet almost certainly degenerative in nature (series 18, image 5). No other abnormal marrow edema. Cord: Question faint cord signal abnormality within the thoracic spinal cord at the level of T10-11 (series 15, image 9). Signal intensity within the thoracic spinal cord is otherwise within normal limits. No epidural collections. Paraspinal and other soft tissues: Paraspinous soft tissues demonstrate no acute finding. Partially visualized lungs are grossly clear. Aortic atherosclerosis noted. Disc levels: Underlying congenital spinal stenosis noted. Postsurgical changes noted within the cervical spine on counter sequence. T1-2: Bilateral facet hypertrophy. No spinal stenosis. Mild bilateral foraminal narrowing. T2-3: Diffuse disc bulge with bilateral facet hypertrophy. Mild spinal stenosis. Moderate left with mild right foraminal narrowing. T3-4: Mild disc bulge. Mild facet hypertrophy. No significant stenosis. Foramina remain patent. T4-5: Mild disc bulge. Mild facet hypertrophy. No significant stenosis. Foramina remain patent. T5-6: Diffuse disc bulge with prominent reactive endplate changes. Facet and ligament flavum hypertrophy. Mild spinal stenosis. Mild right neural foraminal narrowing. T6-7: Diffuse disc bulge. Left greater than right facet hypertrophy. No significant stenosis. T7-8: Diffuse disc bulge. Reactive endplate changes. Bilateral facet hypertrophy. No significant spinal stenosis. Foramina remain patent. T8-9: Diffuse disc bulge, asymmetric  to the left. Prominent reactive endplate changes. Facet hypertrophy. Resultant mild canal stenosis. Mild to moderate bilateral foraminal narrowing. T9-10: Diffuse disc bulge with bilateral facet hypertrophy. Mild spinal stenosis. Mild bilateral foraminal narrowing. T10-11: Diffuse disc bulge with disc desiccation and reactive endplate changes. Bilateral facet hypertrophy. Mild epidural lipomatosis. Resultant moderate spinal stenosis possible faint cord signal abnormality at this level (series 15, image 9). Moderate to severe bilateral foraminal narrowing. T11-12: Diffuse disc bulge with disc desiccation and intervertebral disc space narrowing. Right greater than left facet hypertrophy. Reactive edema about the right T11-12 facet likely degenerative. Resultant moderate spinal stenosis. No definite cord signal changes. Moderate to severe bilateral foraminal narrowing. T12-L1: Mild disc bulge. Bilateral facet  hypertrophy. Mild spinal stenosis. Mild to moderate bilateral foraminal narrowing. MRI LUMBAR SPINE FINDINGS Segmentation: Normal segmentation. Lowest well-formed disc labeled the L5-S1 level. Alignment: 4 mm retrolisthesis of L5 on S1. Straightening of the normal lumbar lordosis. Vertebrae: Patient status post interbody fusion at L3-4 and L4-5, with additional posterior fusion at L4-5. Solid arthrodesis at the L4-5 level. Prior posterior decompression has also been performed at L2-3 and L3-4. Vertebral body heights maintained without acute or chronic fracture. Bone marrow signal intensity within normal limits. No worrisome osseous lesions. Prominent reactive endplate changes about the L1-2 through L3-4 interspaces. No other abnormal marrow edema. Conus medullaris and cauda equina: Conus extends to the L1 level. Conus and cauda equina appear normal. Paraspinal and other soft tissues: Remote postsurgical changes present within the posterior paraspinous soft tissues. Mild increased STIR signal intensity within the  lower posterior paraspinous soft tissues bilaterally, likely due to susceptibility artifact, similar to previous exam. No soft tissue collections. Bilateral renal atrophy with innumerable renal cysts noted. Intra-abdominal aorta ectatic measuring up to 2.7 cm. Disc levels: Underlying congenital spinal stenosis noted. L1-2: Diffuse disc bulge with disc desiccation and intervertebral disc space narrowing. Reactive endplate changes with endplate osteophytic spurring. Moderate facet hypertrophy. Resultant moderate spinal stenosis, stable from previous. Mild to moderate bilateral L1 foraminal narrowing, also unchanged. L2-3: Diffuse disc bulge with disc desiccation and intervertebral disc space narrowing. Reactive endplate changes with marginal endplate osteophytic spurring. Prior posterior decompression. Improved moderate spinal stenosis with the thecal sac measuring 9 mm in AP diameter, previously 6 mm. Residual bilateral facet hypertrophy. Moderate right with moderate to severe left L2 foraminal narrowing, similar to previous. L3-4: Prior interbody fusion. Intervertebral disc space narrowing with endplate osseous ridging. Prior posterior decompression. Residual moderate to severe spinal stenosis with the thecal sac measuring 7 mm in AP diameter, stable from previous. Moderate bilateral L3 foraminal narrowing, also unchanged. L4-5: Prior posterior and interbody fusion with posterior decompression. Wide patency of the thecal sac. No significant foraminal narrowing. Appearance is stable. L5-S1: Mild retrolisthesis. Diffuse disc bulge with intervertebral disc space narrowing and disc desiccation. Moderate facet hypertrophy. Resultant mild to moderate left lateral recess narrowing without significant canal stenosis, stable. Moderate right with severe left L5 foraminal narrowing, also unchanged. IMPRESSION: MR THORACIC SPINE IMPRESSION 1. Multifactorial degenerative changes at T10-11 with resultant moderate to severe spinal  stenosis. Question faint cord signal abnormality at this level, which could reflect edema and/or myelomalacia. 2. Disc bulge with facet hypertrophy at T11-12 with resultant moderate spinal stenosis without cord signal changes. 3. Additional multilevel acquired on congenital canal and foraminal stenosis as detailed above, most notable at T2-3, T8-9, and T9-10. 4. Mild reactive edema about the right T11-12 facet, felt to almost certainly be degenerative in nature. MR LUMBAR SPINE IMPRESSION 1. No acute abnormality within the lumbar spine. 2. Postoperative changes from prior interbody and posterior fusion with decompression at L2-3 through L5-S1 as above. Improved moderate spinal stenosis at L2-3, with unchanged moderate to severe spinal stenosis at L3-4. No residual stenosis at L4-5. 3. Multifactorial degenerative changes with resultant moderate to severe bilateral foraminal narrowing throughout the lumbar spine as above, most notable at L2, L3, and L5 on the left. Electronically Signed   By: Jeannine Boga M.D.   On: 07/16/2018 20:48   Dg Knee Left Port  Result Date: 07/16/2018 CLINICAL DATA:  Knee joint effusion EXAM: PORTABLE LEFT KNEE - 1-2 VIEW COMPARISON:  None. FINDINGS: No acute fracture. No dislocation. Severe tricompartment osteoarthritic change.  Atherosclerotic calcification. Spurring of the superior patella. Small joint effusion. Calcific densities project over the anterior knee joint which may represent loose bodies. IMPRESSION: No acute bony pathology. Small joint effusion. Chronic findings described above. Electronically Signed   By: Marybelle Killings M.D.   On: 07/16/2018 20:51    Procedures Procedures (including critical care time)  Medications Ordered in ED Medications  metroNIDAZOLE (FLAGYL) IVPB 500 mg (0 mg Intravenous Stopped 07/16/18 1636)  aztreonam (AZACTAM) 0.5 g in dextrose 5 % 50 mL IVPB (has no administration in time range)  vancomycin (VANCOCIN) IVPB 1000 mg/200 mL premix  (has no administration in time range)  acetaminophen (TYLENOL) tablet 650 mg (has no administration in time range)  allopurinol (ZYLOPRIM) tablet 100 mg (has no administration in time range)  aspirin EC tablet 81 mg (has no administration in time range)  ezetimibe (ZETIA) tablet 10 mg (has no administration in time range)  colesevelam [facility]) tablet 1,875 mg (has no administration in time range)  citalopram (CELEXA) tablet 10 mg (has no administration in time range)  zolpidem (AMBIEN) tablet 5 mg (has no administration in time range)  methimazole (TAPAZOLE) tablet 2.5 mg (has no administration in time range)  cinacalcet (SENSIPAR) tablet 90 mg (has no administration in time range)  ferric citrate (AURYXIA) tablet 420 mg (has no administration in time range)  pantoprazole (PROTONIX) EC tablet 40 mg (has no administration in time range)  polyethylene glycol (MIRALAX / GLYCOLAX) packet 17 g (has no administration in time range)  saccharomyces boulardii (FLORASTOR) capsule 250 mg (has no administration in time range)  gabapentin (NEURONTIN) capsule 600 mg (has no administration in time range)  multivitamin (RENA-VIT) tablet 1 tablet (has no administration in time range)  hydroxypropyl methylcellulose / hypromellose (ISOPTO TEARS / GONIOVISC) 2.5 % ophthalmic solution 1 drop (has no administration in time range)  heparin injection 5,000 Units (has no administration in time range)  insulin aspart (novoLOG) injection 0-15 Units (has no administration in time range)  insulin aspart (novoLOG) injection 0-5 Units (has no administration in time range)  sodium chloride 0.9 % bolus 250 mL (has no administration in time range)  ferric citrate (AURYXIA) tablet 210 mg (has no administration in time range)  acetaminophen (TYLENOL) tablet 650 mg (650 mg Oral Given 07/16/18 1448)  sodium chloride 0.9 % bolus 500 mL (0 mLs Intravenous Stopped 07/16/18 1632)  vancomycin (VANCOCIN) 2,000 mg in sodium chloride  0.9 % 500 mL IVPB (0 mg Intravenous Stopped 07/16/18 1749)  aztreonam (AZACTAM) 1 g in sodium chloride 0.9 % 100 mL IVPB (0 g Intravenous Stopped 07/16/18 1820)     Initial Impression / Assessment and Plan / ED Course  I have reviewed the triage vital signs and the nursing notes.  Pertinent labs & imaging results that were available during my care of the patient were reviewed by me and considered in my medical decision making (see chart for details).     No clear cause for the patient's fever.  I think the fever is causing his fatigue and overall feeling poorly.  CT obtained given the flank/back pain but this is unremarkable.  Thus we will do an MRI.  However with his elevated lactate and risk factors for bacteremia I think he needs to be admitted for at least observation.  Hospitalist will admit.  No obvious focal joint pain or concern for septic joint given full range of motion of major extremities.  No  Final Clinical Impressions(s) / ED Diagnoses   Final diagnoses:  Fever in adult    ED Discharge Orders    None       Sherwood Gambler, MD 07/16/18 2133

## 2018-07-16 NOTE — ED Notes (Signed)
Admitting at bedside 

## 2018-07-16 NOTE — ED Notes (Signed)
Unable to obtain IV access, IV team consulted again as STAT order.

## 2018-07-16 NOTE — ED Notes (Signed)
ED Provider at bedside. 

## 2018-07-16 NOTE — ED Notes (Signed)
Attempted to start an IV in pt's left hand so that the IV in his [facility] could be removed. Unable to obtain access. Pt states he would be willing to receive EJ, but is adamant that he will pull out the IV in his [facility] so that he can bend his arm without the IV pump alarming. Pt verbally aggressive and shouting at staff throughout the time this RN is bedside.

## 2018-07-16 NOTE — H&P (Signed)
History and Physical    [name] OAC:[id number] DOB: 05/05/1951 DOA: 07/16/2018  PCP: Biagio Borg, MD Consultants:  none Patient coming from: home- lives with wife  Chief Complaint: Back pain, AMS  HPI: [name] is a 67 y.o. male with medical history significant for DM2 with neuropathy, HTN, ESRD on HD via RUE AVF (last session yesterday at [facility]), ischemic cardiomyopathy, chronic combined systolic and diastolic CHF, OSA on CPAP, CAD s/p NSTEMI, hyperthyroidism, HLD, gout, depression who was brought to the ED today after he became lethargic and not as responsive when on his way to get a TTE. States that last night he went to bed early because he was weak, aching "all over," especially in his back and bilateral thighs. He did not take his temp and is unsure if he had a fever at that time. This morning he still felt bad, but made it to his cardiology appt where he started feeling even more weak and lethargic. His wife said he was not responding to her for a few seconds/minutes. He was slumped in his chair. He was brought to the ED. He denies pain currently. States his pain had resolved by the time he got to the ED. Review of systems was negative including fevers/chills/sweats, chest pain, dyspnea, cough, URI symptoms, dizziness, HA, neck pain, photophobia, abd pain, N/V/D, new skin rashes/lesions/sores/insect bites, arthralgias/swelling. He has not had any recent travel. Has not had any sick contacts. He was admitted to the hospital here about 4 weeks ago when he presented with fevers/chills and dyspnea and found to have PNA. He was treated with 5 days of IV Levaquin. Blood cultures were negative at that time. He does not make urine.   ED Course: He was awake and alert upon arrival to ED. Rectal temp was 101.4. He was tachycardic at 107, BP 111/84 which trended to 85/56 and then back to 102/61. RR was 17-24. SaO2 was 91-99% on RA. His lactate was elevated at ~2.5. K was 5.2 -> 6.3. CXR  showed resolved R lung base opacity and no acute CP disease. CTAP noncontrast revealed nothing to suggest infection; showed bilateral nonobstructing kidney stones, prior posterior lumbar fusion at L4-5 with well incorporated interbody block and interbody disc placed at L3-4.   Review of Systems: As per HPI; otherwise review of systems reviewed and negative.   Ambulatory Status:  Ambulates with walker  Past Medical History:  Diagnosis Date  . Allergic rhinitis, cause unspecified 02/24/2014  . Anemia 06/16/2011  . BENIGN PROSTATIC HYPERTROPHY 10/14/2009  . CAD, NATIVE VESSEL 02/06/2009   a. 06/2007 s/p Taxus DES to the RCA;  b. 08/2016 NSTEMI in setting of SVT/PCI: LM 30ost, LAD 50m(3.0x16 Synergy DES), LCX 452mOM1 60, RI 40, RCA 70p/m, 80110mnot amenable to PCI.  . CMarland Kitchenrvical radiculopathy, chronic 02/23/2016   Right c5-6 by NCS/EMG  . Chronic combined systolic (congestive) and diastolic (congestive) heart failure ([place]  a. 10/2016 Echo: EF 40-45%, Gr2 DD. mildly dil LA.  . CMarland KitchenLONIC POLYPS, HX OF 10/14/2009  . Dementia ([id number] Depression 09/24/2015  . DIABETES MELLITUS, TYPE II 02/01/2010  . DYSLIPIDEMIA 06/18/2007  . ESRD (end stage renal disease) on dialysis (HCC[place] Haven1/12/2009   "TTS;  " (04/18/2015)  . FOOT PAIN 08/12/2008  . GAIT DISTURBANCE 03/03/2010  . GASTROENTERITIS, VIRAL 10/14/2009  . GERD 06/18/2007  . GOITER, MULTINODULAR 12/26/2007  . GOUT 06/18/2007  . GYNECOMASTIA 07/17/2010  . Hemodialysis access, fistula mature ([place]CC[place]aud[place]  Dialysis [place] ([place]) Right upper arm fistula  . Hyperlipidemia 10/16/2011  . Hyperparathyroidism, secondary ([place]) 06/16/2011  . HYPERTENSION 06/18/2007  . Hyperthyroidism   . Hypocalcemia 06/07/2010  . Ischemic cardiomyopathy    a. 10/2016 Echo: EF 40-45%.  . Lumbar stenosis with neurogenic claudication   . ONYCHOMYCOSIS, TOENAILS 12/26/2007  . OSA on CPAP 10/16/2011  . Other malaise and fatigue 11/24/2009  . PERIPHERAL NEUROPATHY  06/18/2007  . Prostate cancer ([place])   . PSVT (paroxysmal supraventricular tachycardia) ([place])    a. 29/7989 complicated by NSTEMI;  b. 11/2016 Treated w/ adenosine in ED;  c. 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  [name] PULMONARY NODULE, RIGHT LOWER LOBE 06/08/2009  . Sleep apnea    cpap machine and o2  . TRANSAMINASES, SERUM, ELEVATED 02/01/2010  . Transfusion history    none recent  . Unspecified hypotension 01/30/2010    Past Surgical History:  Procedure Laterality Date  . A/V SHUNTOGRAM N/A 04/07/2018   Procedure: A/V SHUNTOGRAM;  Surgeon: Waynetta Sandy, MD;  Location: [place] CV LAB;  Service: Cardiovascular;  Laterality: N/A;  . ARTERIOVENOUS GRAFT PLACEMENT Right 2009   forearm/notes 02/01/2011  . AV FISTULA PLACEMENT  11/07/2011   Procedure: INSERTION OF ARTERIOVENOUS (AV) GORE-TEX GRAFT ARM;  Surgeon: Tinnie Gens, MD;  Location: [place];  Service: Vascular;  Laterality: Left;  . BACK SURGERY  1998  . BASCILIC VEIN TRANSPOSITION Right 02/27/2013   Procedure: BASCILIC VEIN TRANSPOSITION;  Surgeon: Mal Misty, MD;  Location: [place];  Service: Vascular;  Laterality: Right;  Right Basilic Vein Transposition   . CARDIAC CATHETERIZATION N/A 08/06/2016   Procedure: Left Heart Cath and Coronary Angiography;  Surgeon: Jolaine Artist, MD;  Location: [place] CV LAB;  Service: Cardiovascular;  Laterality: N/A;  . CARDIAC CATHETERIZATION N/A 08/07/2016   Procedure: Coronary/Graft Atherectomy-CSI LAD;  Surgeon: Peter M Martinique, MD;  Location: [place] CV LAB;  Service: Cardiovascular;  Laterality: N/A;  . CERVICAL SPINE SURGERY  2/09   "to repair nerve problems in my left arm"  . CHOLECYSTECTOMY    . COLONOSCOPY WITH PROPOFOL N/A 04/26/2017   Procedure: COLONOSCOPY WITH PROPOFOL;  Surgeon: Otis Brace, MD;  Location: [place];  Service: Gastroenterology;  Laterality: N/A;  . CORONARY ANGIOPLASTY WITH STENT PLACEMENT  06/11/2008  . CORONARY ANGIOPLASTY WITH STENT PLACEMENT  06/2007    TAXUS stent to RCA/notes 01/31/2011  . ESOPHAGOGASTRODUODENOSCOPY  09/28/2011   Procedure: ESOPHAGOGASTRODUODENOSCOPY (EGD);  Surgeon: Missy Sabins, MD;  [facility] END[facility]COPY;  Service: Endoscopy;  Laterality: N/A;  . ESOPHAGOGASTRODUODENOSCOPY N/A 04/07/2015   Procedure: ESOPHAGOGASTRODUODENOSCOPY (EGD);  Surgeon: Teena Irani, MD;  Location: [facility];  Service: Endoscopy;  Laterality: N/A;  . ESOPHAGOGASTRODUODENOSCOPY N/A 04/19/2015   Procedure: ESOPHAGOGASTRODUODENOSCOPY (EGD);  Surgeon: Arta Silence, MD;  Location: [facility] ENDOSCOPY;  Service: Endoscopy;  Laterality: N/A;  . ESOPHAGOGASTRODUODENOSCOPY (EGD) WITH PROPOFOL N/A 06/13/2018   Procedure: ESOPHAGOGASTRODUODENOSCOPY (EGD) WITH PROPOFOL;  Surgeon: Wonda Horner, MD;  Location: [facility] ENDOSCOPY;  Service: Endoscopy;  Laterality: N/A;  . FLEXIBLE SIGMOIDOSCOPY N/A 05/21/2017   Procedure: Conni Elliot;  Surgeon: Clarene Essex, MD;  Location: [place];  Service: Endoscopy;  Laterality: N/A;  . FLEXIBLE SIGMOIDOSCOPY Left 07/02/2017   Procedure: FLEXIBLE SIGMOIDOSCOPY;  Surgeon: Laurence Spates, MD;  Location: [place];  Service: Endoscopy;  Laterality: Left;  . FOREIGN BODY REMOVAL  09/2003   via upper endoscopy/notes 02/12/2011  . GIVENS CAPSULE STUDY  09/30/2011   Procedure: GIVENS CAPSULE STUDY;  Surgeon:  Jeryl Columbia, MD;  Location: [facility] ENDOSCOPY;  Service: Endoscopy;  Laterality: N/A;  . INSERTION OF DIALYSIS CATHETER Right 2014  . INSERTION OF DIALYSIS CATHETER Left 02/11/2013   Procedure: INSERTION OF DIALYSIS CATHETER;  Surgeon: Conrad Emelle, MD;  Location: [place];  Service: Vascular;  Laterality: Left;  Ultrasound guided  . LUMBAR LAMINECTOMY/DECOMPRESSION MICRODISCECTOMY Bilateral 07/31/2017   Procedure: LAMINECTOMY AND FORAMINOTOMY- BILATERAL LUMBAR TWO- LUMBAR THREE;  Surgeon: Earnie Larsson, MD;  Location: [place];  Service: Neurosurgery;  Laterality: Bilateral;  LAMINECTOMY AND FORAMINOTOMY- BILATERAL LUMBAR 2- LUMBAR 3    . PERIPHERAL VASCULAR BALLOON ANGIOPLASTY  04/07/2018   Procedure: PERIPHERAL VASCULAR BALLOON ANGIOPLASTY;  Surgeon: Waynetta Sandy, MD;  Location: [place] CV LAB;  Service: Cardiovascular;;  RUE AVF  . REMOVAL OF A DIALYSIS CATHETER Right 02/11/2013   Procedure: REMOVAL OF A DIALYSIS CATHETER;  Surgeon: Conrad [place], MD;  Location: [place];  Service: Vascular;  Laterality: Right;  . SAVORY DILATION N/A 04/07/2015   Procedure: SAVORY DILATION;  Surgeon: Teena Irani, MD;  Location: [facility] ENDOSCOPY;  Service: Endoscopy;  Laterality: N/A;  . SHUNTOGRAM N/A 09/20/2011   Procedure: Earney Mallet;  Surgeon: Conrad Collegedale, MD;  Location: [facility] CATH LAB;  Service: Cardiovascular;  Laterality: N/A;  . SVT ABLATION N/A 11/26/2016   Procedure: SVT Ablation;  Surgeon: Will Meredith Leeds, MD;  Location: [place] CV LAB;  Service: Cardiovascular;  Laterality: N/A;  . TONSILLECTOMY    . TOTAL KNEE ARTHROPLASTY Right 08/02/2015   Procedure: TOTAL KNEE ARTHROPLASTY;  Surgeon: Renette Butters, MD;  Location: [place];  Service: Orthopedics;  Laterality: Right;  . VENOGRAM N/A 01/26/2013   Procedure: VENOGRAM;  Surgeon: Angelia Mould, MD;  Location[facility] CATH LAB;  Service: Cardiovascular;  Laterality: N/A;    Social History   Socioeconomic History  . Marital status: Married    Spouse name: Not on file  . Number of children: 3  . Years of education: 72  . Highest education level: Not on file  Occupational History  . Occupation: disabled    Employer: DISABLED  . Occupation: formerly Lawyer for [employer].  Social Needs  . Financial resource strain: Not on file  . Food insecurity:    Worry: Not on file    Inability: Not on file  . Transportation needs:    Medical: Not on file    Non-medical: Not on file  Tobacco Use  . Smoking status: Former Smoker    Packs/day: 1.00    Years: 25.00    Pack years: 25.00    Types: Cigarettes    Last attempt to quit: 10/01/2005    Years since  quitting: 12.7  . Smokeless tobacco: Former Systems developer    Quit date: 10/01/2005  . Tobacco comment: Quit smoking 2007 Smoked x 25 years 1/2 ppd.  Substance and Sexual Activity  . Alcohol use: No    Alcohol/week: 0.0 standard drinks  . Drug use: No  . Sexual activity: Never  Lifestyle  . Physical activity:    Days per week: Not on file    Minutes per session: Not on file  . Stress: Not on file  Relationships  . Social connections:    Talks on phone: Not on file    Gets together: Not on file    Attends religious service: Not on file    Active member of club or organization: Not on file    Attends meetings of clubs or organizations: Not on file  Relationship status: Not on file  . Intimate partner violence:    Fear of current or ex partner: Not on file    Emotionally abused: Not on file    Physically abused: Not on file    Forced sexual activity: Not on file  Other Topics Concern  . Not on file  Social History Narrative   Lives with wife   Caffeine use: Tea sometimes   No coffee   Left handed     Allergies  Allergen Reactions  . Cephalexin Swelling and Other (See Comments)    Tongue swelling, but no breathing issues  . Statins Other (See Comments)    Weak muscles  . Ciprofloxacin Rash    Family History  Problem Relation Age of Onset  . Heart disease Sister   . Thyroid nodules Sister   . Heart disease Father   . Diabetes Father   . Kidney failure Father   . Hypertension Father   . Healthy Child   . Healthy Child   . Healthy Child   . Cancer Neg Hx     Prior to Admission medications   Medication Sig Start Date End Date Taking? Authorizing Provider  acetaminophen (TYLENOL) 325 MG tablet Take 650 mg by mouth every 6 (six) hours as needed for mild pain or headache.     [provider]  Alirocumab (PRALUENT) 150 MG/ML SOPN Inject 150 mg into the skin every 14 (fourteen) days. 03/05/18   Lorretta Harp, MD  allopurinol (ZYLOPRIM) 100 MG tablet TAKE ONE TABLET  BY MOUTH ONCE DAILY 10/14/17   Biagio Borg, MD  Amino Acids-Protein Hydrolys (FEEDING SUPPLEMENT, PRO-STAT SUGAR FREE 64,) LIQD Take 30 mLs by mouth 2 (two) times daily. 06/15/18   Regalado, Jerald Kief A, MD  aspirin EC 81 MG tablet Take 81 mg by mouth daily.    [provider]  betamethasone dipropionate (DIPROLENE) 0.05 % cream Apply 1 application topically daily as needed (leg sores).    [provider]  citalopram (CELEXA) 10 MG tablet Take 1 tablet (10 mg total) by mouth at bedtime. 10/14/17   Biagio Borg, MD  ezetimibe (ZETIA) 10 MG tablet TAKE ONE TABLET BY MOUTH ONCE DAILY 04/08/17   Bensimhon, Shaune Pascal, MD  ferric citrate (AURYXIA) 1 GM 210 MG(Fe) tablet Take 210-420 mg by mouth See admin instructions. Take 420 mg by mouth two times a day and 210 mg with each snack    [provider]  gabapentin (NEURONTIN) 300 MG capsule Take 1 capsule (300 mg total) by mouth at bedtime. Patient taking differently: Take 600 mg by mouth at bedtime.  07/05/17   Domenic Polite, MD  gabapentin (NEURONTIN) 300 MG capsule TAKE 1 TO 2 CAPSULES BY MOUTH AT BEDTIME AS NEEDED FOR PAIN AND SLEEP 06/27/18   Biagio Borg, MD  hydroxypropyl methylcellulose / hypromellose (ISOPTO TEARS / GONIOVISC) 2.5 % ophthalmic solution Place 1 drop into both eyes 3 (three) times daily as needed for dry eyes. 05/03/17   Biagio Borg, MD  methimazole (TAPAZOLE) 5 MG tablet Take 0.5 tablets (2.5 mg total) by mouth daily. 05/16/18   Renato Shin, MD  midodrine (PROAMATINE) 10 MG tablet Take 1 tablet (10 mg total) by mouth See admin instructions. Once a day only on dialysis days (Tues/Thurs/Sat) 06/15/18   Regalado, Jerald Kief A, MD  multivitamin (RENA-VIT) TABS tablet Take 1 tablet by mouth daily. Reported on 10/24/2015    [provider]  pantoprazole (PROTONIX) 40 MG tablet Take  40 mg by mouth every evening.  08/31/16   [provider]  polyethylene glycol (MIRALAX / GLYCOLAX) packet Take 17 g by mouth  daily. Patient taking differently: Take 17 g by mouth daily as needed for mild constipation.  05/23/17   Florencia Reasons, MD  saccharomyces boulardii (FLORASTOR) 250 MG capsule Take 1 capsule (250 mg total) by mouth 2 (two) times daily. 06/15/18   Regalado, Belkys A, MD  SENSIPAR 90 MG tablet Take 90 mg by mouth 2 (two) times daily. 09/18/16   [provider]  [facility] 625 MG tablet TAKE THREE TABLETS BY MOUTH TWICE DAILY WITH MEALS 10/04/17   Biagio Borg, MD  zolpidem (AMBIEN) 5 MG tablet Take 1 tablet (5 mg total) by mouth at bedtime as needed. for sleep 06/20/18   Biagio Borg, MD    Physical Exam: Vitals:   07/16/18 1414 07/16/18 1415 07/16/18 1430 07/16/18 1455  BP:  (!) 121/52 (!) 114/39   Pulse:  98 95   Resp:  19 20   Temp: (!) 101.4 F (38.6 C)     TempSrc: Rectal     SpO2:  99% 98%   Weight:    100.7 kg  Height:    6' 1" (1.854 m)     . General:  Appears calm and comfortable and is in NAD . Eyes:  PERRL, EOMI, normal lids, iris . ENT:  grossly normal hearing, lips & tongue, dry mm; appropriate dentition . Neck:  no LAD, masses or thyromegaly; no carotid bruits. No nuchal rigidity . Cardiovascular: tachy, reg rhythm, no murmur. RUE AVF with good thrill/bruit . Respiratory:   CTA bilaterally with no wheezes/rales/rhonchi.  Normal respiratory effort. . Abdomen: obese, soft, NT, ND, NABS . Back:  Well-healed lumbar surgical scar; no spinous process tenderness, no paraspinal mm tenderness; grossly normal alignment, no CVAT . Skin: Chronic venous stasis dermatitis BLE; otherwise no rash or induration . Musculoskeletal:  grossly normal tone BUE/BLE, good ROM. He does have a questionable L knee effusion, slightly warm to touch, but preserved ROM and denies pain. R knee TKA scar well-healed, no obvious joint abnormalities. . Lower extremity: 1+BLE edema.  Foot exam with no ulcerations.  2+ distal pulses. [name] Psychiatric:  grossly normal mood and affect, speech fluent and appropriate,  AOx3 . Neurologic:  CN 2-12 grossly intact, moves all extremities in coordinated fashion, sensation intact    Radiological Exams on Admission: Ct Abdomen Pelvis Wo Contrast  Result Date: 07/16/2018 CLINICAL DATA:  Abdominal pain with fever.  Confusion and weakness. EXAM: CT ABDOMEN AND PELVIS WITHOUT CONTRAST TECHNIQUE: Multidetector CT imaging of the abdomen and pelvis was performed following the standard protocol without IV contrast. COMPARISON:  04/22/2017 FINDINGS: Lower chest: Cardiomegaly with coronary arteriosclerosis and aortic atherosclerosis. No active pulmonary disease. Mild bronchiectasis in the right lower lobe. Hepatobiliary: Pneumobilia is redemonstrated presumably from prior intervention. Cholecystectomy clips are present. The unenhanced liver is unremarkable given limitations of a noncontrast study. No definite mass identified. Pancreas: Unremarkable. No pancreatic ductal dilatation or surrounding inflammatory changes. Spleen: Normal in size without focal abnormality. Adrenals/Urinary Tract: Atrophic bilateral kidneys with stable cysts. Normal bilateral adrenal glands. Renovascular calcifications are identified. A few nonobstructing bilateral renal calculi also present. No hydroureteronephrosis. Decompressed urinary bladder. Stomach/Bowel: The stomach is decompressed in appearance. The duodenal sweep and ligament of Treitz are normal. No small bowel obstruction or inflammation. Increased fecal retention within the colon with scattered colonic diverticulosis without acute diverticulitis. Normal-appearing appendix. Vascular/Lymphatic: Moderate aortoiliac branch  vessel atherosclerosis with mild ectasia of the common iliac arteries up to 19 mm in diameter on right and 13 mm on left. Nonaneurysmal thoracic aorta. No lymphadenopathy. Reproductive: Normal size prostate. Surgical clips are noted projecting posterior to prostate gland. Other: No abscess, free air nor abdominopelvic ascites.  Musculoskeletal: Degenerative disc disease L1-2 and L2-3 with evidence of prior posterior lumbar interbody fusion at L4-5. Interbody blocks noted at L3-4. IMPRESSION: 1. Cardiomegaly with coronary arteriosclerosis. 2. Aortoiliac atherosclerosis with ectasia of the common iliac arteries. No abdominal aortic aneurysm. 3. Atrophic kidneys with bilateral stable appearing renal cysts and nonobstructing bilateral renal calculi. 4. Status post cholecystectomy with biliary redemonstrated likely from prior intervention. 5. Thoracolumbar spondylosis with evidence of prior posterior lumbar fusion at L4-5 with well incorporated interbody block and interbody disc placed at L3-4. Electronically Signed   By: Ashley Royalty M.D.   On: 07/16/2018 16:28   Dg Chest 2 View  Result Date: 07/16/2018 CLINICAL DATA:  67 year old male with fever since last night. Right flank pain. EXAM: CHEST - 2 VIEW COMPARISON:  Chest radiographs 06/10/2018 and earlier. FINDINGS: Stable lung volumes but resolved patchy medial right lung base opacity since September. Stable cardiac size and mediastinal contours. Calcified aortic atherosclerosis. Visualized tracheal air column is within normal limits. No pneumothorax, pulmonary edema, pleural effusion or confluent pulmonary opacity today. Partially visible cervical ACDF. Right axillary vascular stent redemonstrated. No acute osseous abnormality identified. IMPRESSION: 1. No acute cardiopulmonary abnormality. Resolved right lung base opacity since September. 2.  Aortic Atherosclerosis (ICD10-I70.0). Electronically Signed   By: Genevie Ann M.D.   On: 07/16/2018 16:02   Ct Head Wo Contrast  Result Date: 07/16/2018 CLINICAL DATA:  Weakness since this morning EXAM: CT HEAD WITHOUT CONTRAST TECHNIQUE: Contiguous axial images were obtained from the base of the skull through the vertex without intravenous contrast. COMPARISON:  06/11/2018 FINDINGS: Brain: Mild chronic ischemic changes in the periventricular  white matter. Mild global atrophy. No mass effect, midline shift, or acute intracranial hemorrhage. Vascular: No hyperdense vessel or unexpected calcification. Skull: Cranium is intact. Sinuses/Orbits: Mastoid air cells are clear. Minimal mucosal thickening in the sphenoid, frontal, and ethmoid sinus. Orbits are within normal limits. Other: Noncontributory. IMPRESSION: No acute intracranial pathology. Electronically Signed   By: Marybelle Killings M.D.   On: 07/16/2018 14:14    EKG: Independently reviewed. Sinus tach with rate 97; o/w normal  TTE 10/17/2016 - Left ventricle: The cavity size was normal. Systolic function was   mildly to moderately reduced. The estimated ejection fraction was   in the range of 40% to 45%. Wall motion was normal; there were no   regional wall motion abnormalities. Features are consistent with   a pseudonormal left ventricular filling pattern, with concomitant   abnormal relaxation and increased filling pressure (grade 2   diastolic dysfunction). Doppler parameters are consistent with   elevated ventricular end-diastolic filling pressure. - Aortic valve: There was no regurgitation. - Aortic root: The aortic root was normal in size. - Mitral valve: There was trivial regurgitation. - Left atrium: The atrium was mildly dilated. - Right ventricle: Systolic function was normal. - Right atrium: The atrium was normal in size. - Pulmonary arteries: Systolic pressure was within the normal   range. - Inferior vena cava: The vessel was normal in size. - Pericardium, extracardiac: There was no pericardial effusion. Impressions: - Compared to the prior study on 01/12/2015 LVEF decreased,   previously 55%, now 40-45%, there is basal inferior wall   hypokinesis.  Labs on Admission: I have personally reviewed the available labs and imaging studies at the time of the admission.  Pertinent labs:  K 5.2 -> 6.3 Gluc 196  BUN 41 -> 61 Creat 10.02 IoCa 0.93 Alk P 230 Alb  3.3 AST 193 ALT 231 TP 7.6 T bili 2.8  TnI 0.01  Lactate 2.32 -> 1.71  WBC 9.8, 81% PMNs Hgb 12.2 Plt 288  U/A: not done (pt anuric)  Assessment/Plan Principal Problem:   Sepsis ([name]) Active Problems:   Gout   Depression   Hypertensive heart disease with CHF (congestive heart failure) ([name])   Gastro-esophageal reflux disease without esophagitis   Hyperparathyroidism, secondary ([name])   CAD S/P percutaneous coronary angioplasty   OSA (obstructive sleep apnea)   HLD (hyperlipidemia)   Hyperthyroidism   Neuropathy due to secondary diabetes mellitus ([name])   DM (diabetes mellitus) type II controlled with renal manifestation ([name])   ESRD on hemodialysis ([place])   Essential hypertension   Sepsis: Unknown source.  Pneumonia from last month has cleared on chest x-ray and patient has no respiratory complaints. No abdominal pain or CVAT. Symptoms of diffuse muscle achiness, back pain could point to viral infection (consistent with elevated LFTs), pt also may have L knee effusion in which case could have septic arthritis although pt has no pain and preserved ROM of the joint. Pt is s/p chole. He has no GI symptoms. No s/s meningitis. No s/s cellulitis or infected wounds. DDx: septic arthritis, osteomyelitis, pyocystitis, diverticulitis (again, no abdominal complaints), calciphylaxis, viral hepatitis, other viral or tick-borne illness. At any rate he has improved since presenting to the ED as far as his vitals, mental status, and lactate levels. -BCx x 2 sent -pt is allergic to cephalexin; cont aztreonam, vancomycin, flagyl, dosed per HD; tailor abx as soon as appropriate -as pt is on HD will not overdo fluids; he received 500 cc with abx; will give another 250 cc and monitor vitals, mental status closely -hepatitis panel sent -MRI spine ordered by ED physician; follow up -X ray L knee ordered -telemetry bed -consider ID consult if symptoms persist and no source is identified.   ESRD on  HD TTS -consult renal in a.m. For regular HD -dose all meds for GFR -cont Auryxia Cont sensipar -avoid nephrotoxins -cont rena-vit -dialysis diet  T2DM, noninsulin-dependent -pt no longer on any meds -check HbA1C -SSI  Neuropathy -cont gabapentin  OSA on CPAP -cont CPAP at night  Depression -cont celexa, ambien  GERD -cont PPI  Gout (history of) -cont allopurinol  Chronic Combined Systolic and Diastolic CHF due to ischemic CM -cont baby ASA -cont welchol, zetia, Praluent -pt still needs TTE done as scheduled as outpatient  Hyperthyroidism -cont methimazole   DVT prophylaxis: Heparin Kinde Code Status:  Full - confirmed with patient/family Family Communication: wife at bedside  Disposition Plan:  Home once clinically improved Consults called: None (needs renal consult in a.m.)  Admission status: Admit - It is my clinical opinion that admission to INPATIENT is reasonable and necessary because of the expectation that this patient will require hospital care that crosses at least 2 midnights to treat this condition based on the medical complexity of the problems presented.  Given the aforementioned information, the predictability of an adverse outcome is felt to be significant.     Janora Norlander MD [facility]  If note is complete, please contact covering daytime or nighttime physician. [email or web address] Password [email or web address]  07/16/2018, 5:10 PM

## 2018-07-16 NOTE — ED Notes (Signed)
Pt transported to MRI 

## 2018-07-16 NOTE — Progress Notes (Addendum)
Pharmacy Antibiotic Note  [name] is a 67 y.o. male admitted on 07/16/2018 with sepsis of unknown source.  Pharmacy has been consulted for Vancomycin and Aztreronam dosing. Pt is ESRD on HD TTS; pt reports he has not missed any HD sessions. WBC - 9.8, Tmax - 101.4, lactic acid -2.32  Plan: Aztreonam 1g x1, then 500mg  q12h Vancomycin 2g x1, then 1g qHD Metronidazole 500mg  q8h per MD Monitor HD schedule Monitor and adjust abx per renal plans, C&S, vanc troughs as needed  Height: 6\' 1"  (185.4 cm) Weight: 222 lb (100.7 kg) IBW/kg (Calculated) : 79.9  Temp (24hrs), Avg:101.4 F (38.6 C), Min:101.4 F (38.6 C), Max:101.4 F (38.6 C)  Recent Labs  Lab 07/16/18 1453 07/16/18 1501  WBC 9.8  --   CREATININE  --  10.70*  LATICACIDVEN  --  2.32*    Estimated Creatinine Clearance: 8.1 mL/min (A) (by C-G formula based on SCr of 10.7 mg/dL (H)).    Allergies  Allergen Reactions  . Cephalexin Swelling and Other (See Comments)    Tongue swelling, but no breathing issues  . Statins Other (See Comments)    Weak muscles  . Ciprofloxacin Rash    Antimicrobials this admission: Vancomycin 10/16 >>  Aztreonam 10/16 >>  Metronidazole 10/16 >>  Dose adjustments this admission: N/A  Microbiology results: Pending  Thank you for allowing pharmacy to be a part of this patient's care.  Tyson Babinski 07/16/2018 3:31 PM   I discussed / reviewed the pharmacy note by Dr. Jolaine Artist, PharmD Candidate and I agree with the resident's findings and plans as documented.  Rain Friedt S. Alford Highland, PharmD, [place] Clinical Staff Pharmacist

## 2018-07-16 NOTE — ED Notes (Signed)
IV team bedside to start second IV and obtain second set of blood cultures

## 2018-07-16 NOTE — ED Notes (Signed)
Pt states he does dialysis T, TH, Sat and has not missed any days.

## 2018-07-16 NOTE — ED Notes (Signed)
Patient transported to CT 

## 2018-07-16 NOTE — ED Provider Notes (Signed)
Patient placed in Quick Look pathway, seen and evaluated   Chief Complaint: Altered mental status, weakness, confusion  HPI:   Patient's wife reports that he was at cardiology when at 1230 he started feeling weak and not well.  He has been saying he was not feeling well since he got up this morning but got significantly worse.  Patient denies pain.    ROS:per wife no fevers  Physical Exam:   Gen: Moaning, lethargic  Neuro: Patient oriented to person, and being at hospital.  Lethargic, falls asleep frequently.  Awakens to loud voice.  He is able to lift bilateral arms.  Tongue stick out straight.  Pupils dilated and symmetrical.  He is unable to lift bilateral legs.  Slight twitching around mouth intermittently.  No other twitching.   Skin: Warm    Initiation of care has begun. The patient has been counseled on the process, plan, and necessity for staying for the completion/evaluation, and the remainder of the medical screening examination  Patient was seen in Triage by my self and Dr. Lita Mains.  Not currently a code stroke.  Taken back to room.  Attempted to call pod E PA and MD with out success.    Lorin Glass, PA-C 07/16/18 1335    Julianne Rice, MD 07/24/18 2130

## 2018-07-16 NOTE — ED Notes (Signed)
Patient transported to X-ray 

## 2018-07-16 NOTE — ED Notes (Signed)
Pt told this RN that he did not want the IV in his [facility] and that if the IV pump beeps frequently that he is going to pull the IV out. Advised pt not to do so. Will continue to monitor.

## 2018-07-16 NOTE — ED Notes (Signed)
Admitting physician bedside.

## 2018-07-16 NOTE — ED Triage Notes (Signed)
Pt in with family c/o weakness that started this morning, states he didn't feel well when he woke up this morning and they were on their way to the hospital for an appointment and once arriving pt became confused and started having trouble following commands, pt appears to be moving all extremities equally and is generally weak, pt not answering questions and is difficult to arouse during exam

## 2018-07-17 ENCOUNTER — Encounter (HOSPITAL_COMMUNITY): Payer: Self-pay | Admitting: Nephrology

## 2018-07-17 DIAGNOSIS — I2510 Atherosclerotic heart disease of native coronary artery without angina pectoris: Secondary | ICD-10-CM

## 2018-07-17 DIAGNOSIS — Z9861 Coronary angioplasty status: Secondary | ICD-10-CM

## 2018-07-17 LAB — POCT I-STAT, CHEM 8
BUN: 61 mg/dL — ABNORMAL HIGH (ref 8–23)
Calcium, Ion: 0.93 mmol/L — ABNORMAL LOW (ref 1.15–1.40)
Chloride: 102 mmol/L (ref 98–111)
Creatinine, Ser: 10.7 mg/dL — ABNORMAL HIGH (ref 0.61–1.24)
Glucose, Bld: 194 mg/dL — ABNORMAL HIGH (ref 70–99)
HCT: 41 % (ref 39.0–52.0)
Hemoglobin: 13.9 g/dL (ref 13.0–17.0)
Potassium: 6.3 mmol/L (ref 3.5–5.1)
Sodium: 137 mmol/L (ref 135–145)
TCO2: 31 mmol/L (ref 22–32)

## 2018-07-17 LAB — CBC
HCT: 36.4 % — ABNORMAL LOW (ref 39.0–52.0)
Hemoglobin: 11.1 g/dL — ABNORMAL LOW (ref 13.0–17.0)
MCH: 30.5 pg (ref 26.0–34.0)
MCHC: 30.5 g/dL (ref 30.0–36.0)
MCV: 100 fL (ref 80.0–100.0)
Platelets: 261 10*3/uL (ref 150–400)
RBC: 3.64 MIL/uL — ABNORMAL LOW (ref 4.22–5.81)
RDW: 19.6 % — ABNORMAL HIGH (ref 11.5–15.5)
WBC: 7.7 10*3/uL (ref 4.0–10.5)
nRBC: 0.3 % — ABNORMAL HIGH (ref 0.0–0.2)

## 2018-07-17 LAB — COMPREHENSIVE METABOLIC PANEL
ALT: 158 U/L — ABNORMAL HIGH (ref 0–44)
AST: 83 U/L — ABNORMAL HIGH (ref 15–41)
Albumin: 2.9 g/dL — ABNORMAL LOW (ref 3.5–5.0)
Alkaline Phosphatase: 197 U/L — ABNORMAL HIGH (ref 38–126)
Anion gap: 12 (ref 5–15)
BUN: 50 mg/dL — ABNORMAL HIGH (ref 8–23)
CO2: 26 mmol/L (ref 22–32)
Calcium: 8.3 mg/dL — ABNORMAL LOW (ref 8.9–10.3)
Chloride: 101 mmol/L (ref 98–111)
Creatinine, Ser: 11.22 mg/dL — ABNORMAL HIGH (ref 0.61–1.24)
GFR calc Af Amer: 5 mL/min — ABNORMAL LOW (ref >60)
GFR calc non Af Amer: 4 mL/min — ABNORMAL LOW (ref >60)
Glucose, Bld: 125 mg/dL — ABNORMAL HIGH (ref 70–99)
Potassium: 5.1 mmol/L (ref 3.5–5.1)
Sodium: 139 mmol/L (ref 135–145)
Total Bilirubin: 2.4 mg/dL — ABNORMAL HIGH (ref 0.3–1.2)
Total Protein: 6.8 g/dL (ref 6.5–8.1)

## 2018-07-17 LAB — GLUCOSE, CAPILLARY
Glucose-Capillary: 129 mg/dL — ABNORMAL HIGH (ref 70–99)
Glucose-Capillary: 130 mg/dL — ABNORMAL HIGH (ref 70–99)
Glucose-Capillary: 111 mg/dL — ABNORMAL HIGH (ref 70–99)

## 2018-07-17 LAB — HEMOGLOBIN A1C
Hgb A1c MFr Bld: 5.7 % — ABNORMAL HIGH (ref 4.8–5.6)
Mean Plasma Glucose: 116.89 mg/dL

## 2018-07-17 MED ORDER — CHLORHEXIDINE GLUCONATE CLOTH 2 % EX PADS: 6.0000 | MEDICATED_PAD | Freq: Every day | CUTANEOUS | Status: DC

## 2018-07-17 MED ORDER — PENTAFLUOROPROP-TETRAFLUOROETH EX AERO: 1.0000 "application " | INHALATION_SPRAY | CUTANEOUS | Status: DC | PRN

## 2018-07-17 MED ORDER — DOXERCALCIFEROL 4 MCG/2ML IV SOLN
INTRAVENOUS | Status: AC
  Administered 2018-07-17: 4 ug via INTRAVENOUS
  Filled 2018-07-17: qty 2

## 2018-07-17 MED ORDER — DOXERCALCIFEROL 4 MCG/2ML IV SOLN
4.0000 ug | INTRAVENOUS | Status: DC
  Administered 2018-07-17 – 2018-07-20 (×2): 4 ug via INTRAVENOUS
  Filled 2018-07-17 (×3): qty 2

## 2018-07-17 MED ORDER — SODIUM CHLORIDE 0.9 % IV SOLN: 100.0000 mL | INTRAVENOUS | Status: DC | PRN

## 2018-07-17 MED ORDER — PIPERACILLIN-TAZOBACTAM 3.375 G IVPB
3.3750 g | Freq: Two times a day (BID) | INTRAVENOUS | Status: DC
  Administered 2018-07-17 – 2018-07-18 (×2): 3.375 g via INTRAVENOUS
  Filled 2018-07-17 (×3): qty 50

## 2018-07-17 NOTE — Progress Notes (Signed)
CRITICAL VALUE STICKER  CRITICAL VALUE: 1 of 2 blood culture growing GPR  DATE & TIME NOTIFIED: 10/17 @ 1100  MESSENGER (representative from lab): Daquita  MD NOTIFIED: [place]: 10/17 @ 1100  RESPONSE:  Informed - no action needed at this time

## 2018-07-17 NOTE — Progress Notes (Signed)
Pt brought home CPAP unit and has it set up and ready for use when pt is ready for bed. RT will continue to monitor as needed.

## 2018-07-17 NOTE — H&P (Addendum)
Progress Notes         PROGRESS NOTE  [name] QQV:[id number] DOB: [birth year] DOA: 07/16/2018 PCP: Biagio Borg, MD  HPI/Recap of past 24 hours: [name] is a 67 y.o. male with medical history significant for DM2 with neuropathy, HTN, ESRD on HD via RUE AVF (last session yesterday at [name]), ischemic cardiomyopathy, chronic combined systolic and diastolic CHF, OSA on CPAP, CAD s/p NSTEMI, hyperthyroidism, HLD, gout, depression who was brought to the ED today after he became lethargic and not as responsive when on his way to get a TTE. States that last night he went to bed early because he was weak, aching "all over," especially in his back and bilateral thighs. He did not take his temp and is unsure if he had a fever at that time. This morning he still felt bad, but made it to his cardiology appt where he started feeling even more weak and lethargic. His wife said he was not responding to her for a few seconds/minutes. He was slumped in his chair. He was brought to the ED. He denies pain currently. States his pain had resolved by the time he got to the ED.  He was admitted to the hospital here about 4 weeks ago when he presented with fevers/chills and dyspnea and found to have PNA. He was treated with 5 days of IV Levaquin. Blood cultures were negative at that time. He does not make urine.   He was awake and alert upon arrival to ED. Rectal temp was 101.4. He was tachycardic at 107, BP 111/84 which trended to 85/56 and then back to 102/61. RR was 17-24. SaO2 was 91-99% on RA. His lactate was elevated at ~2.5. K was 5.2 -> 6.3. CXR showed resolved R lung base opacity and no acute CP disease. CTAP noncontrast revealed nothing to suggest infection; showed bilateral nonobstructing kidney stones, prior posterior lumbar fusion at L4-5 with well incorporated interbody block and interbody disc placed at L3-4.    07/17/2017: Patient seen and examined at his bedside.  States he feels better.   His lower back pain is improving.  Denies any nausea or abdominal pain.  Scheduled for hemodialysis today.  1 of 2 bottles of blood culture grew gram-positive rods.  Possible contaminant.  Assessment/Plan: Principal Problem:   Sepsis ([place]) Active Problems:   Gout   Depression   Hypertensive heart disease with CHF (congestive heart failure) ([name])   Gastro-esophageal reflux disease without esophagitis   Hyperparathyroidism, secondary ([place])   CAD S/P percutaneous coronary angioplasty   OSA (obstructive sleep apnea)   HLD (hyperlipidemia)   Hyperthyroidism   Neuropathy due to secondary diabetes mellitus ([place])   DM (diabetes mellitus) type II controlled with renal manifestation ([name])   ESRD on hemodialysis ([place])   Essential hypertension  Sepsis of unclear etiology Recently completed treatment for HCAP treated with IV Levaquin Blood cultures 1 out of 2 positive for gram-positive rods.  Possible contaminant. Allergy to Keflex Currently on his IV vancomycin and Zosyn empirically We will DC Flagyl since no abdominal complaints Obtain MRSA screening and if negative discontinue vancomycin Continue Zosyn until source of infection is cleared Obtain procalcitonin and lactic acid Monitor fever curve and WBC Obtain CBC in the morning  ESRD on hemodialysis Tuesday Thursday Saturday HD plan today Continue to monitor electrolytes Avoid nephrotoxic agents/dehydration/hypotension Renal dialysis diet  Non-insulin-dependent type 2 diabetes A1c 5.7 on 07/16/2018 Avoid hypoglycemia Insulin sliding scale as needed for hyperglycemia  Hyperkalemia suspect secondary  to advanced renal failure Improving Potassium 5.1 from 6.3 on admission  Abnormal LFTs post cholecystectomy Currently asymptomatic Repeat chemistry panel in the morning  Combined diastolic and systolic chronic CHF Last 2D echo done on 10/17/2016 revealed LVEF 40 to 45%, basal inferior wall hypokinesis with grade 2 diastolic  dysfunction No acute issues at this time Continue cardiac medications      Code Status: Full code  Family Communication: None at bedside  Disposition Plan: Home possibly in 1 to 2 days or until nephrology signs off.   Consultants:  Nephrology  Procedures:  None  Antimicrobials:  IV vancomycin and Zosyn  DVT prophylaxis:  hep sq 5000 u tid   Objective: Vitals:   07/17/18 1337 07/17/18 1357 07/17/18 1400 07/17/18 1430  BP: (!) 166/82 (!) 159/112 (!) 178/92 (!) 160/74  Pulse: 72 60 78 74  Resp: 16     Temp: (!) 97 F (36.1 C)     TempSrc: Oral     SpO2:      Weight:      Height:        Intake/Output Summary (Last 24 hours) at 07/17/2018 1514 Last data filed at 07/17/2018 1228 Gross per 24 hour  Intake 880 ml  Output -  Net 880 ml   Filed Weights   07/16/18 1455  Weight: 100.7 kg    Exam:  . General: 67 y.o. year-old male well developed well nourished in no acute distress.  Alert and oriented x3. . Cardiovascular: Regular rate and rhythm with no rubs or gallops.  No thyromegaly or JVD noted.   [name] Respiratory: Clear to auscultation with no wheezes or rales. Good inspiratory effort. . Abdomen: Soft nontender nondistended with normal bowel sounds x4 quadrants. . Musculoskeletal: No lower extremity edema. 2/4 pulses in all 4 extremities.  Right upper extremity AV fistula. . Skin: No ulcerative lesions noted or rashes . Psychiatry: Mood is appropriate for condition and setting   Data Reviewed: CBC: Recent Labs  Lab 07/16/18 1453 07/16/18 1501 07/17/18 0614  WBC 9.8  --  7.7  NEUTROABS 8.0*  --   --   HGB 12.2* 13.9  13.9 11.1*  HCT 41.1 41.0  41.0 36.4*  MCV 102.0*  --  100.0  PLT 288  --  542   Basic Metabolic Panel: Recent Labs  Lab 07/16/18 1453 07/16/18 1501 07/16/18 2235 07/17/18 0614  NA 140 137  137  --  139  K 5.2* 6.3*  6.3* 5.4* 5.1  CL 98 102  102  --  101  CO2 27  --   --  26  GLUCOSE 196* 194*  194*  --  125*  BUN  41* 61*  61*  --  50*  CREATININE 10.02* 10.70*  10.70*  --  11.22*  CALCIUM 8.8*  --   --  8.3*   GFR: Estimated Creatinine Clearance: 7.8 mL/min (A) (by C-G formula based on SCr of 11.22 mg/dL (H)). Liver Function Tests: Recent Labs  Lab 07/16/18 1453 07/17/18 0614  AST 193* 83*  ALT 231* 158*  ALKPHOS 230* 197*  BILITOT 2.8* 2.4*  PROT 7.6 6.8  ALBUMIN 3.3* 2.9*   No results for input(s): LIPASE, AMYLASE in the last 168 hours. No results for input(s): AMMONIA in the last 168 hours. Coagulation Profile: Recent Labs  Lab 07/16/18 1453 07/16/18 2235  INR 1.09 1.12   Cardiac Enzymes: Recent Labs  Lab 07/16/18 2235  CKTOTAL 131   BNP (last 3 results) No results for  input(s): PROBNP in the last 8760 hours. HbA1C: Recent Labs    07/16/18 2235  HGBA1C 5.7*   CBG: Recent Labs  Lab 07/16/18 1321 07/16/18 2252 07/17/18 0624 07/17/18 1125  GLUCAP 164* 159* 129* 130*   Lipid Profile: No results for input(s): CHOL, HDL, LDLCALC, TRIG, CHOLHDL, LDLDIRECT in the last 72 hours. Thyroid Function Tests: No results for input(s): TSH, T4TOTAL, FREET4, T3FREE, THYROIDAB in the last 72 hours. Anemia Panel: No results for input(s): VITAMINB12, FOLATE, FERRITIN, TIBC, IRON, RETICCTPCT in the last 72 hours. Urine analysis:    Component Value Date/Time   COLORURINE LT. YELLOW 06/26/2013 1200   APPEARANCEUR CLEAR 06/26/2013 1200   LABSPEC 1.020 06/26/2013 1200   PHURINE 8.5 06/26/2013 1200   GLUCOSEU 100 06/26/2013 1200   HGBUR MODERATE 06/26/2013 1200   BILIRUBINUR NEGATIVE 06/26/2013 1200   KETONESUR NEGATIVE 06/26/2013 1200   PROTEINUR >300 (A) 01/04/2010 1032   UROBILINOGEN 0.2 06/26/2013 1200   NITRITE NEGATIVE 06/26/2013 1200   LEUKOCYTESUR TRACE 06/26/2013 1200   Sepsis Labs: @LABRCNTIP (procalcitonin:4,lacticidven:4)  ) Recent Results (from the past 240 hour(s))  Blood Culture (routine x 2)     Status: None (Preliminary result)   Collection Time: 07/16/18   2:54 PM  Result Value Ref Range Status   Specimen Description BLOOD LEFT ANTECUBITAL  Final   Special Requests   Final    BOTTLES DRAWN AEROBIC AND ANAEROBIC Blood Culture results may not be optimal due to an inadequate volume of blood received in culture bottles   Culture  Setup Time   Final    GRAM POSITIVE RODS ANAEROBIC BOTTLE ONLY CRITICAL RESULT CALLED TO, READ BACK BY AND VERIFIED WITH: Ferne Coe PHARMD, AT 1058 07/17/18 BY D. VANHOOK Performed at [facility], [place] [address] [place], [place] [address]    Culture GRAM POSITIVE RODS  Final   Report Status PENDING  Incomplete  Blood Culture (routine x 2)     Status: None (Preliminary result)   Collection Time: 07/16/18  3:24 PM  Result Value Ref Range Status   Specimen Description BLOOD LEFT ARM  Final   Special Requests   Final    BOTTLES DRAWN AEROBIC AND ANAEROBIC Blood Culture adequate volume   Culture   Final    NO GROWTH < 24 HOURS Performed at [facility], [place] [address] [place], [place] [address]    Report Status PENDING  Incomplete      Studies: Ct Abdomen Pelvis Wo Contrast  Result Date: 07/16/2018 CLINICAL DATA:  Abdominal pain with fever.  Confusion and weakness. EXAM: CT ABDOMEN AND PELVIS WITHOUT CONTRAST TECHNIQUE: Multidetector CT imaging of the abdomen and pelvis was performed following the standard protocol without IV contrast. COMPARISON:  04/22/2017 FINDINGS: Lower chest: Cardiomegaly with coronary arteriosclerosis and aortic atherosclerosis. No active pulmonary disease. Mild bronchiectasis in the right lower lobe. Hepatobiliary: Pneumobilia is redemonstrated presumably from prior intervention. Cholecystectomy clips are present. The unenhanced liver is unremarkable given limitations of a noncontrast study. No definite mass identified. Pancreas: Unremarkable. No pancreatic ductal dilatation or surrounding inflammatory changes. Spleen: Normal in size without focal abnormality. Adrenals/Urinary  Tract: Atrophic bilateral kidneys with stable cysts. Normal bilateral adrenal glands. Renovascular calcifications are identified. A few nonobstructing bilateral renal calculi also present. No hydroureteronephrosis. Decompressed urinary bladder. Stomach/Bowel: The stomach is decompressed in appearance. The duodenal sweep and ligament of Treitz are normal. No small bowel obstruction or inflammation. Increased fecal retention within the colon with scattered colonic diverticulosis without acute diverticulitis.  Normal-appearing appendix. Vascular/Lymphatic: Moderate aortoiliac branch vessel atherosclerosis with mild ectasia of the common iliac arteries up to 19 mm in diameter on right and 13 mm on left. Nonaneurysmal thoracic aorta. No lymphadenopathy. Reproductive: Normal size prostate. Surgical clips are noted projecting posterior to prostate gland. Other: No abscess, free air nor abdominopelvic ascites. Musculoskeletal: Degenerative disc disease L1-2 and L2-3 with evidence of prior posterior lumbar interbody fusion at L4-5. Interbody blocks noted at L3-4. IMPRESSION: 1. Cardiomegaly with coronary arteriosclerosis. 2. Aortoiliac atherosclerosis with ectasia of the common iliac arteries. No abdominal aortic aneurysm. 3. Atrophic kidneys with bilateral stable appearing renal cysts and nonobstructing bilateral renal calculi. 4. Status post cholecystectomy with biliary redemonstrated likely from prior intervention. 5. Thoracolumbar spondylosis with evidence of prior posterior lumbar fusion at L4-5 with well incorporated interbody block and interbody disc placed at L3-4. Electronically Signed   By: Ashley Royalty M.D.   On: 07/16/2018 16:28   Dg Chest 2 View  Result Date: 07/16/2018 CLINICAL DATA:  67 year old male with fever since last night. Right flank pain. EXAM: CHEST - 2 VIEW COMPARISON:  Chest radiographs 06/10/2018 and earlier. FINDINGS: Stable lung volumes but resolved patchy medial right lung base opacity since  September. Stable cardiac size and mediastinal contours. Calcified aortic atherosclerosis. Visualized tracheal air column is within normal limits. No pneumothorax, pulmonary edema, pleural effusion or confluent pulmonary opacity today. Partially visible cervical ACDF. Right axillary vascular stent redemonstrated. No acute osseous abnormality identified. IMPRESSION: 1. No acute cardiopulmonary abnormality. Resolved right lung base opacity since September. 2.  Aortic Atherosclerosis (ICD10-I70.0). Electronically Signed   By: Genevie Ann M.D.   On: 07/16/2018 16:02   Mr Thoracic Spine Wo Contrast  Result Date: 07/16/2018 CLINICAL DATA:  Initial evaluation for fever of unknown origin, inability to lift legs. EXAM: MRI THORACIC AND LUMBAR SPINE WITHOUT CONTRAST TECHNIQUE: Multiplanar and multiecho pulse sequences of the thoracic and lumbar spine were obtained without intravenous contrast. COMPARISON:  Comparison made with prior MRI from 06/28/2017. FINDINGS: MRI THORACIC SPINE FINDINGS Alignment: Vertebral bodies normally aligned with preservation of the normal thoracic kyphosis. No listhesis. Vertebrae: Vertebral body heights maintained without evidence for acute or chronic fracture. Bone marrow signal intensity within normal limits. Multilevel discogenic reactive endplate changes seen throughout the thoracic spine, most notable at T2-3, T5-6, and T10-11. No discrete or worrisome osseous lesions. No findings to suggest discitis. Reactive marrow edema about the right S28-31 facet almost certainly degenerative in nature (series 18, image 5). No other abnormal marrow edema. Cord: Question faint cord signal abnormality within the thoracic spinal cord at the level of T10-11 (series 15, image 9). Signal intensity within the thoracic spinal cord is otherwise within normal limits. No epidural collections. Paraspinal and other soft tissues: Paraspinous soft tissues demonstrate no acute finding. Partially visualized lungs are  grossly clear. Aortic atherosclerosis noted. Disc levels: Underlying congenital spinal stenosis noted. Postsurgical changes noted within the cervical spine on counter sequence. T1-2: Bilateral facet hypertrophy. No spinal stenosis. Mild bilateral foraminal narrowing. T2-3: Diffuse disc bulge with bilateral facet hypertrophy. Mild spinal stenosis. Moderate left with mild right foraminal narrowing. T3-4: Mild disc bulge. Mild facet hypertrophy. No significant stenosis. Foramina remain patent. T4-5: Mild disc bulge. Mild facet hypertrophy. No significant stenosis. Foramina remain patent. T5-6: Diffuse disc bulge with prominent reactive endplate changes. Facet and ligament flavum hypertrophy. Mild spinal stenosis. Mild right neural foraminal narrowing. T6-7: Diffuse disc bulge. Left greater than right facet hypertrophy. No significant stenosis. T7-8: Diffuse disc bulge. Reactive endplate  changes. Bilateral facet hypertrophy. No significant spinal stenosis. Foramina remain patent. T8-9: Diffuse disc bulge, asymmetric to the left. Prominent reactive endplate changes. Facet hypertrophy. Resultant mild canal stenosis. Mild to moderate bilateral foraminal narrowing. T9-10: Diffuse disc bulge with bilateral facet hypertrophy. Mild spinal stenosis. Mild bilateral foraminal narrowing. T10-11: Diffuse disc bulge with disc desiccation and reactive endplate changes. Bilateral facet hypertrophy. Mild epidural lipomatosis. Resultant moderate spinal stenosis possible faint cord signal abnormality at this level (series 15, image 9). Moderate to severe bilateral foraminal narrowing. T11-12: Diffuse disc bulge with disc desiccation and intervertebral disc space narrowing. Right greater than left facet hypertrophy. Reactive edema about the right T11-12 facet likely degenerative. Resultant moderate spinal stenosis. No definite cord signal changes. Moderate to severe bilateral foraminal narrowing. T12-L1: Mild disc bulge. Bilateral facet  hypertrophy. Mild spinal stenosis. Mild to moderate bilateral foraminal narrowing. MRI LUMBAR SPINE FINDINGS Segmentation: Normal segmentation. Lowest well-formed disc labeled the L5-S1 level. Alignment: 4 mm retrolisthesis of L5 on S1. Straightening of the normal lumbar lordosis. Vertebrae: Patient status post interbody fusion at L3-4 and L4-5, with additional posterior fusion at L4-5. Solid arthrodesis at the L4-5 level. Prior posterior decompression has also been performed at L2-3 and L3-4. Vertebral body heights maintained without acute or chronic fracture. Bone marrow signal intensity within normal limits. No worrisome osseous lesions. Prominent reactive endplate changes about the L1-2 through L3-4 interspaces. No other abnormal marrow edema. Conus medullaris and cauda equina: Conus extends to the L1 level. Conus and cauda equina appear normal. Paraspinal and other soft tissues: Remote postsurgical changes present within the posterior paraspinous soft tissues. Mild increased STIR signal intensity within the lower posterior paraspinous soft tissues bilaterally, likely due to susceptibility artifact, similar to previous exam. No soft tissue collections. Bilateral renal atrophy with innumerable renal cysts noted. Intra-abdominal aorta ectatic measuring up to 2.7 cm. Disc levels: Underlying congenital spinal stenosis noted. L1-2: Diffuse disc bulge with disc desiccation and intervertebral disc space narrowing. Reactive endplate changes with endplate osteophytic spurring. Moderate facet hypertrophy. Resultant moderate spinal stenosis, stable from previous. Mild to moderate bilateral L1 foraminal narrowing, also unchanged. L2-3: Diffuse disc bulge with disc desiccation and intervertebral disc space narrowing. Reactive endplate changes with marginal endplate osteophytic spurring. Prior posterior decompression. Improved moderate spinal stenosis with the thecal sac measuring 9 mm in AP diameter, previously 6 mm.  Residual bilateral facet hypertrophy. Moderate right with moderate to severe left L2 foraminal narrowing, similar to previous. L3-4: Prior interbody fusion. Intervertebral disc space narrowing with endplate osseous ridging. Prior posterior decompression. Residual moderate to severe spinal stenosis with the thecal sac measuring 7 mm in AP diameter, stable from previous. Moderate bilateral L3 foraminal narrowing, also unchanged. L4-5: Prior posterior and interbody fusion with posterior decompression. Wide patency of the thecal sac. No significant foraminal narrowing. Appearance is stable. L5-S1: Mild retrolisthesis. Diffuse disc bulge with intervertebral disc space narrowing and disc desiccation. Moderate facet hypertrophy. Resultant mild to moderate left lateral recess narrowing without significant canal stenosis, stable. Moderate right with severe left L5 foraminal narrowing, also unchanged. IMPRESSION: MR THORACIC SPINE IMPRESSION 1. Multifactorial degenerative changes at T10-11 with resultant moderate to severe spinal stenosis. Question faint cord signal abnormality at this level, which could reflect edema and/or myelomalacia. 2. Disc bulge with facet hypertrophy at T11-12 with resultant moderate spinal stenosis without cord signal changes. 3. Additional multilevel acquired on congenital canal and foraminal stenosis as detailed above, most notable at T2-3, T8-9, and T9-10. 4. Mild reactive edema about the right  T11-12 facet, felt to almost certainly be degenerative in nature. MR LUMBAR SPINE IMPRESSION 1. No acute abnormality within the lumbar spine. 2. Postoperative changes from prior interbody and posterior fusion with decompression at L2-3 through L5-S1 as above. Improved moderate spinal stenosis at L2-3, with unchanged moderate to severe spinal stenosis at L3-4. No residual stenosis at L4-5. 3. Multifactorial degenerative changes with resultant moderate to severe bilateral foraminal narrowing throughout the  lumbar spine as above, most notable at L2, L3, and L5 on the left. Electronically Signed   By: Jeannine Boga M.D.   On: 07/16/2018 20:48   Mr Lumbar Spine Wo Contrast  Result Date: 07/16/2018 CLINICAL DATA:  Initial evaluation for fever of unknown origin, inability to lift legs. EXAM: MRI THORACIC AND LUMBAR SPINE WITHOUT CONTRAST TECHNIQUE: Multiplanar and multiecho pulse sequences of the thoracic and lumbar spine were obtained without intravenous contrast. COMPARISON:  Comparison made with prior MRI from 06/28/2017. FINDINGS: MRI THORACIC SPINE FINDINGS Alignment: Vertebral bodies normally aligned with preservation of the normal thoracic kyphosis. No listhesis. Vertebrae: Vertebral body heights maintained without evidence for acute or chronic fracture. Bone marrow signal intensity within normal limits. Multilevel discogenic reactive endplate changes seen throughout the thoracic spine, most notable at T2-3, T5-6, and T10-11. No discrete or worrisome osseous lesions. No findings to suggest discitis. Reactive marrow edema about the right E93-81 facet almost certainly degenerative in nature (series 18, image 5). No other abnormal marrow edema. Cord: Question faint cord signal abnormality within the thoracic spinal cord at the level of T10-11 (series 15, image 9). Signal intensity within the thoracic spinal cord is otherwise within normal limits. No epidural collections. Paraspinal and other soft tissues: Paraspinous soft tissues demonstrate no acute finding. Partially visualized lungs are grossly clear. Aortic atherosclerosis noted. Disc levels: Underlying congenital spinal stenosis noted. Postsurgical changes noted within the cervical spine on counter sequence. T1-2: Bilateral facet hypertrophy. No spinal stenosis. Mild bilateral foraminal narrowing. T2-3: Diffuse disc bulge with bilateral facet hypertrophy. Mild spinal stenosis. Moderate left with mild right foraminal narrowing. T3-4: Mild disc bulge.  Mild facet hypertrophy. No significant stenosis. Foramina remain patent. T4-5: Mild disc bulge. Mild facet hypertrophy. No significant stenosis. Foramina remain patent. T5-6: Diffuse disc bulge with prominent reactive endplate changes. Facet and ligament flavum hypertrophy. Mild spinal stenosis. Mild right neural foraminal narrowing. T6-7: Diffuse disc bulge. Left greater than right facet hypertrophy. No significant stenosis. T7-8: Diffuse disc bulge. Reactive endplate changes. Bilateral facet hypertrophy. No significant spinal stenosis. Foramina remain patent. T8-9: Diffuse disc bulge, asymmetric to the left. Prominent reactive endplate changes. Facet hypertrophy. Resultant mild canal stenosis. Mild to moderate bilateral foraminal narrowing. T9-10: Diffuse disc bulge with bilateral facet hypertrophy. Mild spinal stenosis. Mild bilateral foraminal narrowing. T10-11: Diffuse disc bulge with disc desiccation and reactive endplate changes. Bilateral facet hypertrophy. Mild epidural lipomatosis. Resultant moderate spinal stenosis possible faint cord signal abnormality at this level (series 15, image 9). Moderate to severe bilateral foraminal narrowing. T11-12: Diffuse disc bulge with disc desiccation and intervertebral disc space narrowing. Right greater than left facet hypertrophy. Reactive edema about the right T11-12 facet likely degenerative. Resultant moderate spinal stenosis. No definite cord signal changes. Moderate to severe bilateral foraminal narrowing. T12-L1: Mild disc bulge. Bilateral facet hypertrophy. Mild spinal stenosis. Mild to moderate bilateral foraminal narrowing. MRI LUMBAR SPINE FINDINGS Segmentation: Normal segmentation. Lowest well-formed disc labeled the L5-S1 level. Alignment: 4 mm retrolisthesis of L5 on S1. Straightening of the normal lumbar lordosis. Vertebrae: Patient status post interbody fusion  at L3-4 and L4-5, with additional posterior fusion at L4-5. Solid arthrodesis at the L4-5  level. Prior posterior decompression has also been performed at L2-3 and L3-4. Vertebral body heights maintained without acute or chronic fracture. Bone marrow signal intensity within normal limits. No worrisome osseous lesions. Prominent reactive endplate changes about the L1-2 through L3-4 interspaces. No other abnormal marrow edema. Conus medullaris and cauda equina: Conus extends to the L1 level. Conus and cauda equina appear normal. Paraspinal and other soft tissues: Remote postsurgical changes present within the posterior paraspinous soft tissues. Mild increased STIR signal intensity within the lower posterior paraspinous soft tissues bilaterally, likely due to susceptibility artifact, similar to previous exam. No soft tissue collections. Bilateral renal atrophy with innumerable renal cysts noted. Intra-abdominal aorta ectatic measuring up to 2.7 cm. Disc levels: Underlying congenital spinal stenosis noted. L1-2: Diffuse disc bulge with disc desiccation and intervertebral disc space narrowing. Reactive endplate changes with endplate osteophytic spurring. Moderate facet hypertrophy. Resultant moderate spinal stenosis, stable from previous. Mild to moderate bilateral L1 foraminal narrowing, also unchanged. L2-3: Diffuse disc bulge with disc desiccation and intervertebral disc space narrowing. Reactive endplate changes with marginal endplate osteophytic spurring. Prior posterior decompression. Improved moderate spinal stenosis with the thecal sac measuring 9 mm in AP diameter, previously 6 mm. Residual bilateral facet hypertrophy. Moderate right with moderate to severe left L2 foraminal narrowing, similar to previous. L3-4: Prior interbody fusion. Intervertebral disc space narrowing with endplate osseous ridging. Prior posterior decompression. Residual moderate to severe spinal stenosis with the thecal sac measuring 7 mm in AP diameter, stable from previous. Moderate bilateral L3 foraminal narrowing, also  unchanged. L4-5: Prior posterior and interbody fusion with posterior decompression. Wide patency of the thecal sac. No significant foraminal narrowing. Appearance is stable. L5-S1: Mild retrolisthesis. Diffuse disc bulge with intervertebral disc space narrowing and disc desiccation. Moderate facet hypertrophy. Resultant mild to moderate left lateral recess narrowing without significant canal stenosis, stable. Moderate right with severe left L5 foraminal narrowing, also unchanged. IMPRESSION: MR THORACIC SPINE IMPRESSION 1. Multifactorial degenerative changes at T10-11 with resultant moderate to severe spinal stenosis. Question faint cord signal abnormality at this level, which could reflect edema and/or myelomalacia. 2. Disc bulge with facet hypertrophy at T11-12 with resultant moderate spinal stenosis without cord signal changes. 3. Additional multilevel acquired on congenital canal and foraminal stenosis as detailed above, most notable at T2-3, T8-9, and T9-10. 4. Mild reactive edema about the right T11-12 facet, felt to almost certainly be degenerative in nature. MR LUMBAR SPINE IMPRESSION 1. No acute abnormality within the lumbar spine. 2. Postoperative changes from prior interbody and posterior fusion with decompression at L2-3 through L5-S1 as above. Improved moderate spinal stenosis at L2-3, with unchanged moderate to severe spinal stenosis at L3-4. No residual stenosis at L4-5. 3. Multifactorial degenerative changes with resultant moderate to severe bilateral foraminal narrowing throughout the lumbar spine as above, most notable at L2, L3, and L5 on the left. Electronically Signed   By: Jeannine Boga M.D.   On: 07/16/2018 20:48   Dg Knee Left Port  Result Date: 07/16/2018 CLINICAL DATA:  Knee joint effusion EXAM: PORTABLE LEFT KNEE - 1-2 VIEW COMPARISON:  None. FINDINGS: No acute fracture. No dislocation. Severe tricompartment osteoarthritic change. Atherosclerotic calcification. Spurring of the  superior patella. Small joint effusion. Calcific densities project over the anterior knee joint which may represent loose bodies. IMPRESSION: No acute bony pathology. Small joint effusion. Chronic findings described above. Electronically Signed   By: Marybelle Killings  M.D.   On: 07/16/2018 20:51   US Abdomen Limited Ruq  Result Date: 07/16/2018 CLINICAL DATA:  Elevated ALT. EXAM: ULTRASOUND ABDOMEN LIMITED RIGHT UPPER QUADRANT COMPARISON:  CT abdomen and pelvis 07/16/2018 FINDINGS: Gallbladder: Gallbladder is surgically absent. Common bile duct: Diameter: 6 mm, normal Liver: Diffusely increased hepatic parenchymal echotexture consistent with diffuse fatty infiltration. No focal lesions identified. Portal vein is patent on color Doppler imaging with normal direction of blood flow towards the liver. Incidental finding of cyst in the lower pole right kidney. No hydronephrosis. IMPRESSION: Surgical absence of the gallbladder. Diffuse fatty infiltration of the liver. No evidence of biliary obstruction. Electronically Signed   By: Lucienne Capers M.D.   On: 07/16/2018 23:21    Scheduled Meds: . allopurinol  100 mg Oral Daily  . aspirin EC  81 mg Oral Daily  . Chlorhexidine Gluconate Cloth  6 each Topical Q0600  . cinacalcet  90 mg Oral BID WC  . citalopram  10 mg Oral QHS  . colesevelam  1,875 mg Oral BID WC  . doxercalciferol  4 mcg Intravenous Q T,Th,Sa-HD  . ezetimibe  10 mg Oral Daily  . ferric citrate  420 mg Oral BID WC  . gabapentin  600 mg Oral QHS  . heparin  5,000 Units Subcutaneous Q8H  . insulin aspart  0-15 Units Subcutaneous TID WC  . insulin aspart  0-5 Units Subcutaneous QHS  . methimazole  2.5 mg Oral Daily  . multivitamin  1 tablet Oral QHS  . pantoprazole  40 mg Oral QPM  . saccharomyces boulardii  250 mg Oral BID    Continuous Infusions: . sodium chloride    . sodium chloride    . piperacillin-tazobactam (ZOSYN)  IV    . vancomycin       LOS: 1 day     Kayleen Memos,  MD [facility] Pager [phone]  If 7PM-7AM, please contact night-coverage [email or web address] Password [email or web address] 07/17/2018, 3:14 PM

## 2018-07-17 NOTE — Procedures (Signed)
Patient seen on Hemodialysis. QB 350, UF goal 1.5L Tolerating current treatment.  Elmarie Shiley MD [facility]. Office # [phone] Pager # [phone] 5:18 PM

## 2018-07-17 NOTE — Progress Notes (Signed)
Received report from [place] on patient @ 1530, pt. Off the unit and in hemodialysis.  Pt. Returned to unit @ 1845.

## 2018-07-17 NOTE — Consult Note (Addendum)
Renal Service Consult Note [place] 07/17/2018 Sol Blazing Requesting Physician: Dr Loletha Grayer. [place]  Reason for Consult:  ESRD pt w/ fevers HPI: The patient is a 67 y.o. year-old presented for feeling really bad and sick and weak for about the last 2-3 weeks, he was going to see his cardiologist and per his wife he blacked out and was talking crazy she couldn't understand what he was saying.  He was brought to the ED and then woke up and didn't remember coming here.  States he was here 3 wks ago admitted for pna or some kind of virus.  No recent fevers, chills, but did have marked aching muscles all over his body.  No abd pain or n/v/d, no SOB or cough, no CP.  Asked to see for dialysis.   Hx of ESRD 2/2 DM/HTN on HD TTS at [facility], first starting in 08/2008.  Past medical history significant for DMT2, CAD s/p stents, mild TR, MO, OSA on CPAP, gout, depression, GERD, Hx GIB, and combined diastolic/systolic HF, EF 58-09%. Patient was recently admitted from 9/10-9/15 due to HCAP.   ROS  denies CP  no joint pain   no HA  no blurry vision  no rash  no diarrhea  no nausea/ vomiting   Past Medical History  Past Medical History:  Diagnosis Date  . Allergic rhinitis, cause unspecified 02/24/2014  . Anemia 06/16/2011  . BENIGN PROSTATIC HYPERTROPHY 10/14/2009  . CAD, NATIVE VESSEL 02/06/2009   a. 06/2007 s/p Taxus DES to the RCA;  b. 08/2016 NSTEMI in setting of SVT/PCI: LM 30ost, LAD 37m (3.0x16 Synergy DES), LCX 65m, OM1 60, RI 40, RCA 70p/m, 35m - not amenable to PCI.  [name] Cervical radiculopathy, chronic 02/23/2016   Right c5-6 by NCS/EMG  . Chronic combined systolic (congestive) and diastolic (congestive) heart failure ([place])    a. 10/2016 Echo: EF 40-45%, Gr2 DD. mildly dil LA.  [name] COLONIC POLYPS, HX OF 10/14/2009  . Dementia ([place]) [id number]  . Depression 09/24/2015  . DIABETES MELLITUS, TYPE II 02/01/2010  . DYSLIPIDEMIA 06/18/2007  . ESRD (end stage renal  disease) on dialysis ([place]) 08/04/2010   "TTS;  " (04/18/2015)  . FOOT PAIN 08/12/2008  . GAIT DISTURBANCE 03/03/2010  . GASTROENTERITIS, VIRAL 10/14/2009  . GERD 06/18/2007  . GOITER, MULTINODULAR 12/26/2007  . GOUT 06/18/2007  . GYNECOMASTIA 07/17/2010  . Hemodialysis access, fistula mature [facility])    Dialysis T-Th-Sa ([place]) Right upper arm fistula  . Hyperlipidemia 10/16/2011  . Hyperparathyroidism, secondary ([place]) 06/16/2011  . HYPERTENSION 06/18/2007  . Hyperthyroidism   . Hypocalcemia 06/07/2010  . Ischemic cardiomyopathy    a. 10/2016 Echo: EF 40-45%.  . Lumbar stenosis with neurogenic claudication   . ONYCHOMYCOSIS, TOENAILS 12/26/2007  . OSA on CPAP 10/16/2011  . Other malaise and fatigue 11/24/2009  . PERIPHERAL NEUROPATHY 06/18/2007  . Prostate cancer ([place])   . PSVT (paroxysmal supraventricular tachycardia) ([place])    a. 39/7673 complicated by NSTEMI;  b. 11/2016 Treated w/ adenosine in ED;  c. 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  [name] PULMONARY NODULE, RIGHT LOWER LOBE 06/08/2009  . Sleep apnea    cpap machine and o2  . TRANSAMINASES, SERUM, ELEVATED 02/01/2010  . Transfusion history    none recent  . Unspecified hypotension 01/30/2010   Past Surgical History  Past Surgical History:  Procedure Laterality Date  . A/V SHUNTOGRAM N/A 04/07/2018   Procedure: A/V SHUNTOGRAM;  Surgeon: Waynetta Sandy,  MD;  Location: [place] CV LAB;  Service: Cardiovascular;  Laterality: N/A;  . ARTERIOVENOUS GRAFT PLACEMENT Right 2009   forearm/notes 02/01/2011  . AV FISTULA PLACEMENT  11/07/2011   Procedure: INSERTION OF ARTERIOVENOUS (AV) GORE-TEX GRAFT ARM;  Surgeon: Tinnie Gens, MD;  Location: [place];  Service: Vascular;  Laterality: Left;  . BACK SURGERY  1998  . BASCILIC VEIN TRANSPOSITION Right 02/27/2013   Procedure: BASCILIC VEIN TRANSPOSITION;  Surgeon: Mal Misty, MD;  Location: [place];  Service: Vascular;  Laterality: Right;  Right Basilic Vein Transposition   . CARDIAC  CATHETERIZATION N/A 08/06/2016   Procedure: Left Heart Cath and Coronary Angiography;  Surgeon: Jolaine Artist, MD;  Location: [place] CV LAB;  Service: Cardiovascular;  Laterality: N/A;  . CARDIAC CATHETERIZATION N/A 08/07/2016   Procedure: Coronary/Graft Atherectomy-CSI LAD;  Surgeon: Peter M Martinique, MD;  Location: [place] CV LAB;  Service: Cardiovascular;  Laterality: N/A;  . CERVICAL SPINE SURGERY  2/09   "to repair nerve problems in my left arm"  . CHOLECYSTECTOMY    . COLONOSCOPY WITH PROPOFOL N/A 04/26/2017   Procedure: COLONOSCOPY WITH PROPOFOL;  Surgeon: Otis Brace, MD;  Location: [place];  Service: Gastroenterology;  Laterality: N/A;  . CORONARY ANGIOPLASTY WITH STENT PLACEMENT  06/11/2008  . CORONARY ANGIOPLASTY WITH STENT PLACEMENT  06/2007   TAXUS stent to RCA/notes 01/31/2011  . ESOPHAGOGASTRODUODENOSCOPY  09/28/2011   Procedure: ESOPHAGOGASTRODUODENOSCOPY (EGD);  Surgeon: Missy Sabins, MD;  Location: [facility] ENDOSCOPY;  Service: Endoscopy;  Laterality: N/A;  . ESOPHAGOGASTRODUODENOSCOPY N/A 04/07/2015   Procedure: ESOPHAGOGASTRODUODENOSCOPY (EGD);  Surgeon: Teena Irani, MD;  Location: [facility];  Service: Endoscopy;  Laterality: N/A;  . ESOPHAGOGASTRODUODENOSCOPY N/A 04/19/2015   Procedure: ESOPHAGOGASTRODUODENOSCOPY (EGD);  Surgeon: Arta Silence, MD;  Location: [facility] ENDOSCOPY;  Service: Endoscopy;  Laterality: N/A;  . ESOPHAGOGASTRODUODENOSCOPY (EGD) WITH PROPOFOL N/A 06/13/2018   Procedure: ESOPHAGOGASTRODUODENOSCOPY (EGD) WITH PROPOFOL;  Surgeon: Wonda Horner, MD;  Location: [facility] ENDOSCOPY;  Service: Endoscopy;  Laterality: N/A;  . FLEXIBLE SIGMOIDOSCOPY N/A 05/21/2017   Procedure: Conni Elliot;  Surgeon: Clarene Essex, MD;  Location: [name];  Service: Endoscopy;  Laterality: N/A;  . FLEXIBLE SIGMOIDOSCOPY Left 07/02/2017   Procedure: FLEXIBLE SIGMOIDOSCOPY;  Surgeon: Laurence Spates, MD;  Location: [place];  Service: Endoscopy;  Laterality:  Left;  . FOREIGN BODY REMOVAL  09/2003   via upper endoscopy/notes 02/12/2011  . GIVENS CAPSULE STUDY  09/30/2011   Procedure: GIVENS CAPSULE STUDY;  Surgeon: Jeryl Columbia, MD;  Location: [facility] ENDOSCOPY;  Service: Endoscopy;  Laterality: N/A;  . INSERTION OF DIALYSIS CATHETER Right 2014  . INSERTION OF DIALYSIS CATHETER Left 02/11/2013   Procedure: INSERTION OF DIALYSIS CATHETER;  Surgeon: Conrad Cade, MD;  Location: [place];  Service: Vascular;  Laterality: Left;  Ultrasound guided  . LUMBAR LAMINECTOMY/DECOMPRESSION MICRODISCECTOMY Bilateral 07/31/2017   Procedure: LAMINECTOMY AND FORAMINOTOMY- BILATERAL LUMBAR TWO- LUMBAR THREE;  Surgeon: Earnie Larsson, MD;  Location: [place];  Service: Neurosurgery;  Laterality: Bilateral;  LAMINECTOMY AND FORAMINOTOMY- BILATERAL LUMBAR 2- LUMBAR 3  . PERIPHERAL VASCULAR BALLOON ANGIOPLASTY  04/07/2018   Procedure: PERIPHERAL VASCULAR BALLOON ANGIOPLASTY;  Surgeon: Waynetta Sandy, MD;  Location: [place] CV LAB;  Service: Cardiovascular;;  RUE AVF  . REMOVAL OF A DIALYSIS CATHETER Right 02/11/2013   Procedure: REMOVAL OF A DIALYSIS CATHETER;  Surgeon: Conrad Joliet, MD;  Location: [place];  Service: Vascular;  Laterality: Right;  . SAVORY DILATION N/A 04/07/2015   Procedure: SAVORY DILATION;  Surgeon: Teena Irani, MD;  Location: [facility];  Service: Endoscopy;  Laterality: N/A;  . SHUNTOGRAM N/A 09/20/2011   Procedure: Earney Mallet;  Surgeon: Conrad Underwood[place], MD;  Location: [facility] CATH LAB;  Service: Cardiovascular;  Laterality: N/A;  . SVT ABLATION N/A 11/26/2016   Procedure: SVT Ablation;  Surgeon: Will Meredith Leeds, MD;  Location: [place] CV LAB;  Service: Cardiovascular;  Laterality: N/A;  . TONSILLECTOMY    . TOTAL KNEE ARTHROPLASTY Right 08/02/2015   Procedure: TOTAL KNEE ARTHROPLASTY;  Surgeon: Renette Butters, MD;  Location: [place];  Service: Orthopedics;  Laterality: Right;  . VENOGRAM N/A 01/26/2013   Procedure: VENOGRAM;  Surgeon: Angelia Mould, MD;  Location: [facility] CATH LAB;  Service: Cardiovascular;  Laterality: N/A;   Family History  Family History  Problem Relation Age of Onset  . Heart disease Sister   . Thyroid nodules Sister   . Heart disease Father   . Diabetes Father   . Kidney failure Father   . Hypertension Father   . Healthy Child   . Healthy Child   . Healthy Child   . Cancer Neg Hx    Social History  reports that he quit smoking about 12 years ago. His smoking use included cigarettes. He has a 25.00 pack-year smoking history. He quit smokeless tobacco use about 12 years ago. He reports that he does not drink alcohol or use drugs. Allergies  Allergies  Allergen Reactions  . Cephalexin Swelling and Other (See Comments)    Tongue swelling, but no breathing issues  . Statins Other (See Comments)    Weak muscles  . Ciprofloxacin Rash   Home medications Prior to Admission medications   Medication Sig Start Date End Date Taking? Authorizing Provider  acetaminophen (TYLENOL) 325 MG tablet Take 650 mg by mouth every 6 (six) hours as needed for mild pain or headache.    Yes [provider]  Alirocumab (PRALUENT) 150 MG/ML SOPN Inject 150 mg into the skin every 14 (fourteen) days. 03/05/18  Yes Lorretta Harp, MD  allopurinol (ZYLOPRIM) 100 MG tablet TAKE ONE TABLET BY MOUTH ONCE DAILY 10/14/17  Yes Biagio Borg, MD  Amino Acids-Protein Hydrolys (FEEDING SUPPLEMENT, PRO-STAT SUGAR FREE 64,) LIQD Take 30 mLs by mouth 2 (two) times daily. 06/15/18  Yes Regalado, Belkys A, MD  aspirin EC 81 MG tablet Take 81 mg by mouth daily.   Yes [provider]  betamethasone dipropionate (DIPROLENE) 0.05 % cream Apply 1 application topically daily as needed (leg sores).   Yes [provider]  citalopram (CELEXA) 10 MG tablet Take 1 tablet (10 mg total) by mouth at bedtime. 10/14/17  Yes Biagio Borg, MD  ezetimibe (ZETIA) 10 MG tablet TAKE ONE TABLET BY MOUTH ONCE DAILY 04/08/17  Yes Bensimhon, Shaune Pascal, MD  ferric citrate (AURYXIA) 1 GM 210 MG(Fe) tablet Take 210-420 mg by mouth See admin instructions. Take 420 mg by mouth two times a day and 210 mg with each snack   Yes [provider]  gabapentin (NEURONTIN) 300 MG capsule Take 1 capsule (300 mg total) by mouth at bedtime. Patient taking differently: Take 600 mg by mouth at bedtime.  07/05/17  Yes Domenic Polite, MD  hydroxypropyl methylcellulose / hypromellose (ISOPTO TEARS / GONIOVISC) 2.5 % ophthalmic solution Place 1 drop into both eyes 3 (three) times daily as needed for dry eyes. 05/03/17  Yes Biagio Borg, MD  methimazole (TAPAZOLE) 5 MG tablet Take 0.5 tablets (2.5  mg total) by mouth daily. 05/16/18  Yes Renato Shin, MD  multivitamin (RENA-VIT) TABS tablet Take 1 tablet by mouth daily. Reported on 10/24/2015   Yes [provider]  pantoprazole (PROTONIX) 40 MG tablet Take 40 mg by mouth every evening.  08/31/16  Yes [provider]  polyethylene glycol (MIRALAX / GLYCOLAX) packet Take 17 g by mouth daily. Patient taking differently: Take 17 g by mouth daily as needed for mild constipation.  05/23/17  Yes Florencia Reasons, MD  SENSIPAR 90 MG tablet Take 90 mg by mouth 2 (two) times daily. 09/18/16  Yes [provider]  WELCHOL 625 MG tablet TAKE THREE TABLETS BY MOUTH TWICE DAILY WITH MEALS 10/04/17  Yes Biagio Borg, MD  zolpidem (AMBIEN) 5 MG tablet Take 1 tablet (5 mg total) by mouth at bedtime as needed. for sleep 06/20/18  Yes Biagio Borg, MD  gabapentin (NEURONTIN) 300 MG capsule TAKE 1 TO 2 CAPSULES BY MOUTH AT BEDTIME AS NEEDED FOR PAIN AND SLEEP Patient not taking: Reported on 07/16/2018 06/27/18   Biagio Borg, MD  midodrine (PROAMATINE) 10 MG tablet Take 1 tablet (10 mg total) by mouth See admin instructions. Once a day only on dialysis days (Tues/Thurs/Sat) Patient not taking: Reported on 07/16/2018 06/15/18   Regalado, Jerald Kief A, MD  saccharomyces boulardii (FLORASTOR) 250 MG capsule Take 1 capsule  (250 mg total) by mouth 2 (two) times daily. Patient not taking: Reported on 07/16/2018 06/15/18   Elmarie Shiley, MD   Liver Function Tests Recent Labs  Lab 07/16/18 1453 07/17/18 0614  AST 193* 83*  ALT 231* 158*  ALKPHOS 230* 197*  BILITOT 2.8* 2.4*  PROT 7.6 6.8  ALBUMIN 3.3* 2.9*   No results for input(s): LIPASE, AMYLASE in the last 168 hours. CBC Recent Labs  Lab 07/16/18 1453 07/16/18 1501 07/17/18 0614  WBC 9.8  --  7.7  NEUTROABS 8.0*  --   --   HGB 12.2* 13.9 11.1*  HCT 41.1 41.0 36.4*  MCV 102.0*  --  100.0  PLT 288  --  229   Basic Metabolic Panel Recent Labs  Lab 07/16/18 1453 07/16/18 1501 07/16/18 2235 07/17/18 0614  NA 140 137  --  139  K 5.2* 6.3* 5.4* 5.1  CL 98 102  --  101  CO2 27  --   --  26  GLUCOSE 196* 194*  --  125*  BUN 41* 61*  --  50*  CREATININE 10.02* 10.70*  --  11.22*  CALCIUM 8.8*  --   --  8.3*   Iron/TIBC/Ferritin/ %Sat    Component Value Date/Time   IRON 16 (L) 06/12/2018 0500   TIBC 168 (L) 06/12/2018 0500   FERRITIN 709 (H) 10/17/2016 1103   IRONPCTSAT 10 (L) 06/12/2018 0500    Vitals:   07/16/18 1815 07/16/18 1819 07/16/18 2047 07/17/18 0001  BP: 102/61  119/68 134/60  Pulse: 83     Resp: (!) 24  16   Temp:  99.1 F (37.3 C) 98.4 F (36.9 C) 97.7 F (36.5 C)  TempSrc:  Oral Oral Oral  SpO2: 95%   100%  Weight:      Height:   6\' 1"  (1.854 m)    Exam Gen alert  No rash, cyanosis or gangrene Sclera anicteric, throat clear  No jvd or bruits Chest clear bilat no rales or wheeznig RRR no MRG Abd soft ntnd no mass or ascites +bs GU normal male MS no joint effusions  or deformity Ext no LE edema, no wounds or ulcers Neuro is alert, Ox 3 , nf R upper arm AVG +bruit    Home meds:  - allopurinol 100/ aspirin 81/  pantoprazole 40/ ferric citrate tid ac/ methimazole 2.5 qd  - ezetimibe 10 qd/ alirocumab 150 mg q 2wks/ welchol tid 325  - citalopram 10 hs/ gabapentin 600 hs  - midodrine 10 mg pre HD  TTS  - florastor bid/ MVI/ prns/ vitamins  Dialysis: TTS   4h  350/800  100kg  2/2.25 bath  P2  Hep none  - hect 4  - mircera 200 last 10/10     Impression/ Plan: 1. Fevers/ myalgias - w/u in progress. HD access looks benign 2. ESRD HD TTS.  HD today 3. HL - gets injections q 2wks in addition to other med 4. Hypotension - chronic w/ HD , takes midodrine pre HD 5. Anemia ckd - Hb 11, observe 6. CAD hx stenting/ CM EF 40-45% 7. MBD ckd - cont meds   Kelly Splinter MD [place] pager [phone]   07/17/2018, 9:59 AM

## 2018-07-18 DIAGNOSIS — F329 Major depressive disorder, single episode, unspecified: Secondary | ICD-10-CM

## 2018-07-18 LAB — HEPATITIS PANEL, ACUTE
HCV Ab: <0.1 s/co ratio (ref 0.0–0.9)
Hep A IgM: NEGATIVE
Hep B C IgM: NEGATIVE
Hepatitis B Surface Ag: NEGATIVE

## 2018-07-18 LAB — CBC
HCT: 36.1 % — ABNORMAL LOW (ref 39.0–52.0)
Hemoglobin: 10.9 g/dL — ABNORMAL LOW (ref 13.0–17.0)
MCH: 29.9 pg (ref 26.0–34.0)
MCHC: 30.2 g/dL (ref 30.0–36.0)
MCV: 99.2 fL (ref 80.0–100.0)
NRBC: 0 % (ref 0.0–0.2)
PLATELETS: 249 10*3/uL (ref 150–400)
RBC: 3.64 MIL/uL — AB (ref 4.22–5.81)
RDW: 19 % — ABNORMAL HIGH (ref 11.5–15.5)
WBC: 5.8 10*3/uL (ref 4.0–10.5)

## 2018-07-18 LAB — CBC WITH DIFFERENTIAL/PLATELET
Abs Immature Granulocytes: 0.02 10*3/uL (ref 0.00–0.07)
BASOS ABS: 0 10*3/uL (ref 0.0–0.1)
BASOS PCT: 1 %
EOS ABS: 0.2 10*3/uL (ref 0.0–0.5)
Eosinophils Relative: 4 %
HCT: 36.9 % — ABNORMAL LOW (ref 39.0–52.0)
Hemoglobin: 11.2 g/dL — ABNORMAL LOW (ref 13.0–17.0)
IMMATURE GRANULOCYTES: 0 %
Lymphocytes Relative: 20 %
Lymphs Abs: 1.1 10*3/uL (ref 0.7–4.0)
MCH: 30.1 pg (ref 26.0–34.0)
MCHC: 30.4 g/dL (ref 30.0–36.0)
MCV: 99.2 fL (ref 80.0–100.0)
Monocytes Absolute: 0.8 10*3/uL (ref 0.1–1.0)
Monocytes Relative: 16 %
NEUTROS PCT: 59 %
NRBC: 0 % (ref 0.0–0.2)
Neutro Abs: 3.2 10*3/uL (ref 1.7–7.7)
PLATELETS: 273 10*3/uL (ref 150–400)
RBC: 3.72 MIL/uL — AB (ref 4.22–5.81)
RDW: 19.3 % — AB (ref 11.5–15.5)
WBC: 5.3 10*3/uL (ref 4.0–10.5)

## 2018-07-18 LAB — GLUCOSE, CAPILLARY
Glucose-Capillary: 95 mg/dL (ref 70–99)
Glucose-Capillary: 98 mg/dL (ref 70–99)
Glucose-Capillary: 93 mg/dL (ref 70–99)
Glucose-Capillary: 85 mg/dL (ref 70–99)

## 2018-07-18 LAB — BLOOD CULTURE ID PANEL (REFLEXED)
ACINETOBACTER BAUMANNII: NOT DETECTED
CANDIDA ALBICANS: NOT DETECTED
CANDIDA GLABRATA: NOT DETECTED
CANDIDA KRUSEI: NOT DETECTED
CANDIDA PARAPSILOSIS: NOT DETECTED
CANDIDA TROPICALIS: NOT DETECTED
ENTEROBACTER CLOACAE COMPLEX: NOT DETECTED
ENTEROBACTERIACEAE SPECIES: NOT DETECTED
ESCHERICHIA COLI: NOT DETECTED
Enterococcus species: NOT DETECTED
HAEMOPHILUS INFLUENZAE: NOT DETECTED
KLEBSIELLA OXYTOCA: NOT DETECTED
KLEBSIELLA PNEUMONIAE: NOT DETECTED
Listeria monocytogenes: NOT DETECTED
Methicillin resistance: NOT DETECTED
Neisseria meningitidis: NOT DETECTED
PSEUDOMONAS AERUGINOSA: NOT DETECTED
Proteus species: NOT DETECTED
STREPTOCOCCUS PYOGENES: NOT DETECTED
Serratia marcescens: NOT DETECTED
Staphylococcus aureus (BCID): NOT DETECTED
Staphylococcus species: DETECTED — AB
Streptococcus agalactiae: NOT DETECTED
Streptococcus pneumoniae: NOT DETECTED
Streptococcus species: NOT DETECTED

## 2018-07-18 LAB — RENAL FUNCTION PANEL
ANION GAP: 15 (ref 5–15)
Albumin: 2.7 g/dL — ABNORMAL LOW (ref 3.5–5.0)
BUN: 30 mg/dL — ABNORMAL HIGH (ref 8–23)
CALCIUM: 9 mg/dL (ref 8.9–10.3)
CO2: 27 mmol/L (ref 22–32)
Chloride: 98 mmol/L (ref 98–111)
Creatinine, Ser: 8.36 mg/dL — ABNORMAL HIGH (ref 0.61–1.24)
GFR calc non Af Amer: 6 mL/min — ABNORMAL LOW (ref >60)
GFR, EST AFRICAN AMERICAN: 7 mL/min — AB (ref >60)
Glucose, Bld: 111 mg/dL — ABNORMAL HIGH (ref 70–99)
Phosphorus: 5.1 mg/dL — ABNORMAL HIGH (ref 2.5–4.6)
Potassium: 4.1 mmol/L (ref 3.5–5.1)
SODIUM: 140 mmol/L (ref 135–145)

## 2018-07-18 LAB — LACTIC ACID, PLASMA: LACTIC ACID, VENOUS: 0.9 mmol/L (ref 0.5–1.9)

## 2018-07-18 LAB — MRSA PCR SCREENING: MRSA BY PCR: NEGATIVE

## 2018-07-18 LAB — PROCALCITONIN: PROCALCITONIN: 16.35 ng/mL

## 2018-07-18 MED ORDER — LIDOCAINE-PRILOCAINE 2.5-2.5 % EX CREA
1.0000 "application " | TOPICAL_CREAM | CUTANEOUS | Status: DC | PRN
  Filled 2018-07-18: qty 5

## 2018-07-18 MED ORDER — LIDOCAINE HCL (PF) 1 % IJ SOLN: 5.0000 mL | INTRAMUSCULAR | Status: DC | PRN

## 2018-07-18 MED ORDER — SODIUM CHLORIDE 0.9 % IV SOLN
2.0000 g | INTRAVENOUS | Status: DC
  Administered 2018-07-18 – 2018-07-19 (×2): 2 g via INTRAVENOUS
  Filled 2018-07-18 (×4): qty 20

## 2018-07-18 MED ORDER — HEPARIN SODIUM (PORCINE) 1000 UNIT/ML DIALYSIS
1000.0000 [IU] | INTRAMUSCULAR | Status: DC | PRN
  Filled 2018-07-18: qty 1

## 2018-07-18 MED ORDER — ALTEPLASE 2 MG IJ SOLR
2.0000 mg | Freq: Once | INTRAMUSCULAR | Status: DC | PRN
  Filled 2018-07-18: qty 2

## 2018-07-18 MED ORDER — CHLORHEXIDINE GLUCONATE CLOTH 2 % EX PADS: 6.0000 | MEDICATED_PAD | Freq: Every day | CUTANEOUS | Status: DC

## 2018-07-18 MED ORDER — DEXTROSE 5 % IV SOLN
0.5000 g | Freq: Two times a day (BID) | INTRAVENOUS | Status: DC
  Filled 2018-07-18: qty 0.5

## 2018-07-18 NOTE — Progress Notes (Signed)
Patient has many concerns about the medications he is taking while inpatient. He states he is not receiving all of the medication he takes at home and that is making him not feel well generally. He stated he is having some pain in his abdomen and lower back. Patient would like to speak to the doctor about his medication. I advised the doctor will be rounding this morning. Lacretia Leigh, RN

## 2018-07-18 NOTE — Progress Notes (Signed)
Progress Notes         PROGRESS NOTE  [name]:[id number] DOB: 1950/10/18 DOA: 07/16/2018 PCP: Biagio Borg, MD  HPI/Recap of past 24 hours: [name] is a 67 y.o. male with medical history significant for DM2 with neuropathy, HTN, ESRD on HD via RUE AVF (last session yesterday at [facility]), ischemic cardiomyopathy, chronic combined systolic and diastolic CHF, OSA on CPAP, CAD s/p NSTEMI, hyperthyroidism, HLD, gout, depression who was brought to the ED today after he became lethargic and not as responsive when on his way to get a TTE. States that last night he went to bed early because he was weak, aching "all over," especially in his back and bilateral thighs. He did not take his temp and is unsure if he had a fever at that time. This morning he still felt bad, but made it to his cardiology appt where he started feeling even more weak and lethargic. His wife said he was not responding to her for a few seconds/minutes. He was slumped in his chair. He was brought to the ED. He denies pain currently. States his pain had resolved by the time he got to the ED.  He was admitted to the hospital here about 4 weeks ago when he presented with fevers/chills and dyspnea and found to have PNA. He was treated with 5 days of IV Levaquin. Blood cultures were negative at that time. He does not make urine.   He was awake and alert upon arrival to ED. Rectal temp was 101.4. He was tachycardic at 107, BP 111/84 which trended to 85/56 and then back to 102/61. RR was 17-24. SaO2 was 91-99% on RA. His lactate was elevated at ~2.5. K was 5.2 -> 6.3. CXR showed resolved R lung base opacity and no acute CP disease. CTAP noncontrast revealed nothing to suggest infection; showed bilateral nonobstructing kidney stones, prior posterior lumbar fusion at L4-5 with well incorporated interbody block and interbody disc placed at L3-4.    07/17/2017: Patient seen and examined at his bedside.  States he feels better.   His lower back pain is improving.  Denies any nausea or abdominal pain.  Scheduled for hemodialysis today.  1 of 2 bottles of blood culture grew gram-positive rods.  Possible contaminant.  07/18/18: seen and examined at bedside. No acute events overnight. 1/2 bottle positive for klebsiella.    Assessment/Plan: Principal Problem:   Sepsis ([place]) Active Problems:   Gout   Depression   Hypertensive heart disease with CHF (congestive heart failure) ([name])   Gastro-esophageal reflux disease without esophagitis   Hyperparathyroidism, secondary ([name])   CAD S/P percutaneous coronary angioplasty   OSA (obstructive sleep apnea)   HLD (hyperlipidemia)   Hyperthyroidism   Neuropathy due to secondary diabetes mellitus ([place])   DM (diabetes mellitus) type II controlled with renal manifestation ([name])   ESRD on hemodialysis ([place])   Essential hypertension  Sepsis 2/2 to klebsiella pneumonia bacteremia IV antibiotics changed to rocephin  procalcitonin 16 Monitor fever curve and WBC Obtain CBC in the morning Repeat blood cultures peripherally x2  ESRD on hemodialysis Tuesday Thursday Saturday Continue to monitor electrolytes Avoid nephrotoxic agents/dehydration/hypotension Renal dialysis diet  Anemia of chronic disease the setting of ESRD Hemoglobin stable No sign of overt bleeding  Non-insulin-dependent type 2 diabetes A1c 5.7 on 07/16/2018 Avoid hypoglycemia Insulin sliding scale as needed for hyperglycemia  Hyperkalemia suspect secondary to advanced renal failure Improving Potassium 5.1 from 6.3 on admission  Abnormal LFTs post cholecystectomy  Currently asymptomatic Repeat chemistry panel in the morning  Combined diastolic and systolic chronic CHF Last 2D echo done on 10/17/2016 revealed LVEF 40 to 45%, basal inferior wall hypokinesis with grade 2 diastolic dysfunction No acute issues at this time Continue cardiac medications      Code Status: Full code  Family  Communication: None at bedside  Disposition Plan: Home possibly in 1 to 2 days or when nephrology signs off.   Consultants:  Nephrology  Procedures:  None  Antimicrobials:  IV Rocephin  DVT prophylaxis:  hep sq 5000 u tid   Objective: Vitals:   07/18/18 0510 07/18/18 0802 07/18/18 1217 07/18/18 1619  BP: (!) 144/77 115/69 97/60 129/69  Pulse: 85 82 80 74  Resp:  20 16 16   Temp: 98.3 F (36.8 C) (!) 97.5 F (36.4 C) 97.9 F (36.6 C) 98.5 F (36.9 C)  TempSrc: Oral Oral Oral Oral  SpO2: 99% 100% 96% 95%  Weight:      Height:        Intake/Output Summary (Last 24 hours) at 07/18/2018 1710 Last data filed at 07/18/2018 1400 Gross per 24 hour  Intake 477.95 ml  Output 804 ml  Net -326.05 ml   Filed Weights   07/16/18 1455 07/17/18 1337 07/17/18 1800  Weight: 100.7 kg 101.3 kg 100.3 kg    Exam:  . General: 67 y.o. year-old male well-developed well-nourished in no acute distress.  Alert O x3. . Cardiovascular: RRR no rubs or gallops. No JVD or thyromegaly. [name] Respiratory: Clear to auscultation with no wheezes or rales. Good inspiratory effort. . Abdomen: Soft nontender nondistended with normal bowel sounds x4 quadrants. . Musculoskeletal: No lower extremity edema. 2/4 pulses in all 4 extremities.  Right upper extremity AV fistula. . Skin: No ulcerative lesions noted or rashes . Psychiatry: Mood is appropriate for condition and setting   Data Reviewed: CBC: Recent Labs  Lab 07/16/18 1453 07/16/18 1501 07/17/18 0614 07/18/18 0915  WBC 9.8  --  7.7 5.3  NEUTROABS 8.0*  --   --  3.2  HGB 12.2* 13.9  13.9 11.1* 11.2*  HCT 41.1 41.0  41.0 36.4* 36.9*  MCV 102.0*  --  100.0 99.2  PLT 288  --  261 295   Basic Metabolic Panel: Recent Labs  Lab 07/16/18 1453 07/16/18 1501 07/16/18 2235 07/17/18 0614  NA 140 137  137  --  139  K 5.2* 6.3*  6.3* 5.4* 5.1  CL 98 102  102  --  101  CO2 27  --   --  26  GLUCOSE 196* 194*  194*  --  125*  BUN 41*  61*  61*  --  50*  CREATININE 10.02* 10.70*  10.70*  --  11.22*  CALCIUM 8.8*  --   --  8.3*   GFR: Estimated Creatinine Clearance: 7.7 mL/min (A) (by C-G formula based on SCr of 11.22 mg/dL (H)). Liver Function Tests: Recent Labs  Lab 07/16/18 1453 07/17/18 0614  AST 193* 83*  ALT 231* 158*  ALKPHOS 230* 197*  BILITOT 2.8* 2.4*  PROT 7.6 6.8  ALBUMIN 3.3* 2.9*   No results for input(s): LIPASE, AMYLASE in the last 168 hours. No results for input(s): AMMONIA in the last 168 hours. Coagulation Profile: Recent Labs  Lab 07/16/18 1453 07/16/18 2235  INR 1.09 1.12   Cardiac Enzymes: Recent Labs  Lab 07/16/18 2235  CKTOTAL 131   BNP (last 3 results) No results for input(s): PROBNP in the last 8760  hours. HbA1C: Recent Labs    07/16/18 2235  HGBA1C 5.7*   CBG: Recent Labs  Lab 07/17/18 0624 07/17/18 1125 07/17/18 2126 07/18/18 0657 07/18/18 1152  GLUCAP 129* 130* 111* 95 98   Lipid Profile: No results for input(s): CHOL, HDL, LDLCALC, TRIG, CHOLHDL, LDLDIRECT in the last 72 hours. Thyroid Function Tests: No results for input(s): TSH, T4TOTAL, FREET4, T3FREE, THYROIDAB in the last 72 hours. Anemia Panel: No results for input(s): VITAMINB12, FOLATE, FERRITIN, TIBC, IRON, RETICCTPCT in the last 72 hours. Urine analysis:    Component Value Date/Time   COLORURINE LT. YELLOW 06/26/2013 1200   APPEARANCEUR CLEAR 06/26/2013 1200   LABSPEC 1.020 06/26/2013 1200   PHURINE 8.5 06/26/2013 1200   GLUCOSEU 100 06/26/2013 1200   HGBUR MODERATE 06/26/2013 1200   BILIRUBINUR NEGATIVE 06/26/2013 1200   KETONESUR NEGATIVE 06/26/2013 1200   PROTEINUR >300 (A) 01/04/2010 1032   UROBILINOGEN 0.2 06/26/2013 1200   NITRITE NEGATIVE 06/26/2013 1200   LEUKOCYTESUR TRACE 06/26/2013 1200   Sepsis Labs: @LABRCNTIP (procalcitonin:4,lacticidven:4)  ) Recent Results (from the past 240 hour(s))  Blood Culture (routine x 2)     Status: Abnormal (Preliminary result)    Collection Time: 07/16/18  2:54 PM  Result Value Ref Range Status   Specimen Description BLOOD LEFT ANTECUBITAL  Final   Special Requests   Final    BOTTLES DRAWN AEROBIC AND ANAEROBIC Blood Culture results may not be optimal due to an inadequate volume of blood received in culture bottles   Culture  Setup Time   Final    GRAM NEGATIVE RODS ANAEROBIC BOTTLE ONLY CRITICAL RESULT CALLED TO, READ BACK BY AND VERIFIED WITH: E. MARTIN PHARMD, AT 1058 07/17/18 BY D. VANHOOK AEROBIC BOTTLE ONLY GRAM POSITIVE COCCI Organism ID to follow CRITICAL RESULT CALLED TO, READ BACK BY AND VERIFIED WITH: V BRYK PHARMD 07/17/18 2359 JDW CORRECTED RESULTS PREVIOUSLY REPORTED AS: GRAM POSITIVE RODS CORRECTED RESULTS CALLED TO: PHARMD R RUMBERG 07/18/18 AT 1315 BY CM    Culture (A)  Final    KLEBSIELLA PNEUMONIAE STAPHYLOCOCCUS SPECIES (COAGULASE NEGATIVE) THE SIGNIFICANCE OF ISOLATING THIS ORGANISM FROM A SINGLE SET OF BLOOD CULTURES WHEN MULTIPLE SETS ARE DRAWN IS UNCERTAIN. PLEASE NOTIFY THE MICROBIOLOGY DEPARTMENT WITHIN ONE WEEK IF SPECIATION AND SENSITIVITIES ARE REQUIRED. Performed at [facility], [place] [address] [place], [place] [address]    Report Status PENDING  Incomplete  Blood Culture ID Panel (Reflexed)     Status: Abnormal   Collection Time: 07/16/18  2:54 PM  Result Value Ref Range Status   Enterococcus species NOT DETECTED NOT DETECTED Final   Listeria monocytogenes NOT DETECTED NOT DETECTED Final   Staphylococcus species DETECTED (A) NOT DETECTED Final    Comment: Methicillin (oxacillin) susceptible coagulase negative staphylococcus. Possible blood culture contaminant (unless isolated from more than one blood culture draw or clinical case suggests pathogenicity). No antibiotic treatment is indicated for blood  culture contaminants. CRITICAL RESULT CALLED TO, READ BACK BY AND VERIFIED WITH: V BRYK PHARMD 07/17/18 2359 JDW    Staphylococcus aureus (BCID) NOT DETECTED NOT DETECTED  Final   Methicillin resistance NOT DETECTED NOT DETECTED Final   Streptococcus species NOT DETECTED NOT DETECTED Final   Streptococcus agalactiae NOT DETECTED NOT DETECTED Final   Streptococcus pneumoniae NOT DETECTED NOT DETECTED Final   Streptococcus pyogenes NOT DETECTED NOT DETECTED Final   Acinetobacter baumannii NOT DETECTED NOT DETECTED Final   Enterobacteriaceae species NOT DETECTED NOT DETECTED Final   Enterobacter cloacae complex NOT DETECTED NOT  DETECTED Final   Escherichia coli NOT DETECTED NOT DETECTED Final   Klebsiella oxytoca NOT DETECTED NOT DETECTED Final   Klebsiella pneumoniae NOT DETECTED NOT DETECTED Final   Proteus species NOT DETECTED NOT DETECTED Final   Serratia marcescens NOT DETECTED NOT DETECTED Final   Haemophilus influenzae NOT DETECTED NOT DETECTED Final   Neisseria meningitidis NOT DETECTED NOT DETECTED Final   Pseudomonas aeruginosa NOT DETECTED NOT DETECTED Final   Candida albicans NOT DETECTED NOT DETECTED Final   Candida glabrata NOT DETECTED NOT DETECTED Final   Candida krusei NOT DETECTED NOT DETECTED Final   Candida parapsilosis NOT DETECTED NOT DETECTED Final   Candida tropicalis NOT DETECTED NOT DETECTED Final    Comment: Performed at [facility], [place] [address] [place], [place] [address]  Blood Culture (routine x 2)     Status: None (Preliminary result)   Collection Time: 07/16/18  3:24 PM  Result Value Ref Range Status   Specimen Description BLOOD LEFT ARM  Final   Special Requests   Final    BOTTLES DRAWN AEROBIC AND ANAEROBIC Blood Culture adequate volume   Culture   Final    NO GROWTH 2 DAYS Performed at [facility], [place] [address] [place], [place] [address]    Report Status PENDING  Incomplete  MRSA PCR Screening     Status: None   Collection Time: 07/18/18 12:16 PM  Result Value Ref Range Status   MRSA by PCR NEGATIVE NEGATIVE Final    Comment:        The GeneXpert MRSA Assay (FDA approved for NASAL  specimens only), is one component of a comprehensive MRSA colonization surveillance program. It is not intended to diagnose MRSA infection nor to guide or monitor treatment for MRSA infections. Performed at [facility], [place] [address] [place], [place] [address]       Studies: No results found.  Scheduled Meds: . allopurinol  100 mg Oral Daily  . aspirin EC  81 mg Oral Daily  . Chlorhexidine Gluconate Cloth  6 each Topical Q0600  . cinacalcet  90 mg Oral BID WC  . citalopram  10 mg Oral QHS  . colesevelam  1,875 mg Oral BID WC  . doxercalciferol  4 mcg Intravenous Q T,Th,Sa-HD  . ezetimibe  10 mg Oral Daily  . ferric citrate  420 mg Oral BID WC  . gabapentin  600 mg Oral QHS  . heparin  5,000 Units Subcutaneous Q8H  . insulin aspart  0-15 Units Subcutaneous TID WC  . insulin aspart  0-5 Units Subcutaneous QHS  . methimazole  2.5 mg Oral Daily  . multivitamin  1 tablet Oral QHS  . pantoprazole  40 mg Oral QPM  . saccharomyces boulardii  250 mg Oral BID    Continuous Infusions: . cefTRIAXone (ROCEPHIN)  IV 2 g (07/18/18 1611)     LOS: 2 days     Kayleen Memos, MD [facility] Pager [phone]  If 7PM-7AM, please contact night-coverage [email or web address] Password [email or web address] 07/18/2018, 5:10 PM

## 2018-07-18 NOTE — Progress Notes (Signed)
PHARMACY - PHYSICIAN COMMUNICATION CRITICAL VALUE ALERT - BLOOD CULTURE IDENTIFICATION (BCID)  [name] is an 67 y.o. male who presented to [facility] on 07/16/2018 with a chief complaint of decreased responsiveness.  Assessment:  67yo male admitted w/ sepsis of unclear etiology; had completed 5-day course of Levaquin within the month for PNA, currently on vanc and Zosyn; blood cx returned 1/2 GPR in anaerobic bottle, now aerobic bottle returns coag-negative staph, also likely a contaminant.  Name of physician (or Provider) Contacted: Chaney Malling, NP  Current antibiotics: vancomycin and Zosyn  Changes to prescribed antibiotics recommended:  Current antibiotics have been reviewed by attending MD w/ plan to de-escalate soon.  Results for orders placed or performed during the hospital encounter of 07/16/18  Blood Culture ID Panel (Reflexed) (Collected: 07/16/2018  2:54 PM)  Result Value Ref Range   Enterococcus species NOT DETECTED NOT DETECTED   Listeria monocytogenes NOT DETECTED NOT DETECTED   Staphylococcus species DETECTED (A) NOT DETECTED   Staphylococcus aureus (BCID) NOT DETECTED NOT DETECTED   Methicillin resistance NOT DETECTED NOT DETECTED   Streptococcus species NOT DETECTED NOT DETECTED   Streptococcus agalactiae NOT DETECTED NOT DETECTED   Streptococcus pneumoniae NOT DETECTED NOT DETECTED   Streptococcus pyogenes NOT DETECTED NOT DETECTED   Acinetobacter baumannii NOT DETECTED NOT DETECTED   Enterobacteriaceae species NOT DETECTED NOT DETECTED   Enterobacter cloacae complex NOT DETECTED NOT DETECTED   Escherichia coli NOT DETECTED NOT DETECTED   Klebsiella oxytoca NOT DETECTED NOT DETECTED   Klebsiella pneumoniae NOT DETECTED NOT DETECTED   Proteus species NOT DETECTED NOT DETECTED   Serratia marcescens NOT DETECTED NOT DETECTED   Haemophilus influenzae NOT DETECTED NOT DETECTED   Neisseria meningitidis NOT DETECTED NOT DETECTED   Pseudomonas aeruginosa NOT  DETECTED NOT DETECTED   Candida albicans NOT DETECTED NOT DETECTED   Candida glabrata NOT DETECTED NOT DETECTED   Candida krusei NOT DETECTED NOT DETECTED   Candida parapsilosis NOT DETECTED NOT DETECTED   Candida tropicalis NOT DETECTED NOT DETECTED    Wynona Neat, PharmD, BCPS  07/18/2018  12:09 AM

## 2018-07-18 NOTE — Care Management Important Message (Signed)
Important Message  Patient Details  Name: [name] MRN: [id number] Date of Birth: 1950-12-26   Medicare Important Message Given:  Yes    Barbarann Kelly [address] 07/18/2018, 3:40 PM

## 2018-07-18 NOTE — Progress Notes (Signed)
[facility] Progress Note  Subjective: feeling some what better, aches/ pain improved.  Blood cx 1/2 + for klebs pna and also 1/2 + for coag neg staph     Vitals:   07/18/18 0510 07/18/18 0802 07/18/18 1217 07/18/18 1619  BP: (!) 144/77 115/69 97/60 129/69  Pulse: 85 82 80 74  Resp:  20 16 16   Temp: 98.3 F (36.8 C) (!) 97.5 F (36.4 C) 97.9 F (36.6 C) 98.5 F (36.9 C)  TempSrc: Oral Oral Oral Oral  SpO2: 99% 100% 96% 95%  Weight:      Height:        Inpatient medications: . allopurinol  100 mg Oral Daily  . aspirin EC  81 mg Oral Daily  . Chlorhexidine Gluconate Cloth  6 each Topical Q0600  . cinacalcet  90 mg Oral BID WC  . citalopram  10 mg Oral QHS  . colesevelam  1,875 mg Oral BID WC  . doxercalciferol  4 mcg Intravenous Q T,Th,Sa-HD  . ezetimibe  10 mg Oral Daily  . ferric citrate  420 mg Oral BID WC  . gabapentin  600 mg Oral QHS  . heparin  5,000 Units Subcutaneous Q8H  . insulin aspart  0-15 Units Subcutaneous TID WC  . insulin aspart  0-5 Units Subcutaneous QHS  . methimazole  2.5 mg Oral Daily  . multivitamin  1 tablet Oral QHS  . pantoprazole  40 mg Oral QPM  . saccharomyces boulardii  250 mg Oral BID   . cefTRIAXone (ROCEPHIN)  IV 2 g (07/18/18 1611)   acetaminophen, ferric citrate, hydroxypropyl methylcellulose / hypromellose, polyethylene glycol, zolpidem  Iron/TIBC/Ferritin/ %Sat    Component Value Date/Time   IRON 16 (L) 06/12/2018 0500   TIBC 168 (L) 06/12/2018 0500   FERRITIN 709 (H) 10/17/2016 1103   IRONPCTSAT 10 (L) 06/12/2018 0500    Exam: Gen alert  No rash, cyanosis or gangrene Sclera anicteric, throat clear  No jvd or bruits Chest clear bilat no rales or wheeznig RRR no MRG Abd soft ntnd no mass or ascites +bs GU normal male MS no joint effusions or deformity Ext no LE edema, no wounds or ulcers Neuro is alert, Ox 3 , nf R upper arm AVF +bruit    Home meds:  - allopurinol 100/ aspirin 81/  pantoprazole  40/ ferric citrate tid ac/ methimazole 2.5 qd  - ezetimibe 10 qd/ alirocumab 150 mg q 2wks/ welchol tid 325  - citalopram 10 hs/ gabapentin 600 hs  - midodrine 10 mg pre HD TTS  - florastor bid/ MVI/ prns/ vitamins  Dialysis: TTS   4h  350/800  100kg  2/2.25 bath  P2  Hep none   RUA AVF  - hect 4  - mircera 200 last 10/10     Impression/ Plan: 1. Fevers/ myalgias - fevers better, +blood cx for klebs pna and coag neg staph, on IV abx. RUA access no gross signs of infection.  Not clear source.  Per primary.  2. ESRD HD TTS.  HD Sat 3. Hypotension - chronic w/ HD , takes midodrine pre HD 4. Anemia ckd - Hb 11, observe 5. CAD hx stenting/ CM EF 40-45% 6. MBD ckd - cont meds    Kelly Splinter MD [facility] pager [phone]   07/18/2018, 4:35 PM   Recent Labs  Lab 07/16/18 1453 07/16/18 1501 07/16/18 2235 07/17/18 0614  NA 140 137  137  --  139  K 5.2* 6.3*  6.3*  5.4* 5.1  CL 98 102  102  --  101  CO2 27  --   --  26  GLUCOSE 196* 194*  194*  --  125*  BUN 41* 61*  61*  --  50*  CREATININE 10.02* 10.70*  10.70*  --  11.22*  CALCIUM 8.8*  --   --  8.3*  ALBUMIN 3.3*  --   --  2.9*  INR 1.09  --  1.12  --    Recent Labs  Lab 07/16/18 1453 07/17/18 0614  AST 193* 83*  ALT 231* 158*  ALKPHOS 230* 197*  BILITOT 2.8* 2.4*  PROT 7.6 6.8   Recent Labs  Lab 07/16/18 1453  07/17/18 0614 07/18/18 0915  WBC 9.8  --  7.7 5.3  NEUTROABS 8.0*  --   --  3.2  HGB 12.2*   < > 11.1* 11.2*  HCT 41.1   < > 36.4* 36.9*  MCV 102.0*  --  100.0 99.2  PLT 288  --  261 273   < > = values in this interval not displayed.

## 2018-07-19 LAB — GLUCOSE, CAPILLARY
Glucose-Capillary: 113 mg/dL — ABNORMAL HIGH (ref 70–99)
Glucose-Capillary: 132 mg/dL — ABNORMAL HIGH (ref 70–99)
Glucose-Capillary: 105 mg/dL — ABNORMAL HIGH (ref 70–99)
Glucose-Capillary: 123 mg/dL — ABNORMAL HIGH (ref 70–99)

## 2018-07-19 LAB — CULTURE, BLOOD (ROUTINE X 2)

## 2018-07-19 NOTE — Progress Notes (Signed)
Pt on home CPAP with home settings and resting comfortably.

## 2018-07-19 NOTE — Progress Notes (Signed)
Progress Notes         PROGRESS NOTE  [name] YFV:[id number] DOB: 01/02/1951 DOA: 07/16/2018 PCP: Biagio Borg, MD  HPI/Recap of past 24 hours: [name] is a 67 y.o. male with medical history significant for DM2 with neuropathy, HTN, ESRD on HD via RUE AVF (last session yesterday at [facility]), ischemic cardiomyopathy, chronic combined systolic and diastolic CHF, OSA on CPAP, CAD s/p NSTEMI, hyperthyroidism, HLD, gout, depression who was brought to the ED today after he became lethargic and not as responsive when on his way to get a TTE. States that last night he went to bed early because he was weak, aching "all over," especially in his back and bilateral thighs. He did not take his temp and is unsure if he had a fever at that time. This morning he still felt bad, but made it to his cardiology appt where he started feeling even more weak and lethargic. His wife said he was not responding to her for a few seconds/minutes. He was slumped in his chair. He was brought to the ED. He denies pain currently. States his pain had resolved by the time he got to the ED.  He was admitted to the hospital here about 4 weeks ago when he presented with fevers/chills and dyspnea and found to have PNA. He was treated with 5 days of IV Levaquin. Blood cultures were negative at that time. He does not make urine.   He was awake and alert upon arrival to ED. Rectal temp was 101.4. He was tachycardic at 107, BP 111/84 which trended to 85/56 and then back to 102/61. RR was 17-24. SaO2 was 91-99% on RA. His lactate was elevated at ~2.5. K was 5.2 -> 6.3. CXR showed resolved R lung base opacity and no acute CP disease. CTAP noncontrast revealed nothing to suggest infection; showed bilateral nonobstructing kidney stones, prior posterior lumbar fusion at L4-5 with well incorporated interbody block and interbody disc placed at L3-4.    07/17/2017: Patient seen and examined at his bedside.  States he feels better.   His lower back pain is improving.  Denies any nausea or abdominal pain.  Scheduled for hemodialysis today.  1 of 2 bottles of blood culture grew gram-positive rods.  Possible contaminant.  07/18/18: seen and examined at bedside. No acute events overnight. 1/2 bottle positive for klebsiella.  07/19/2018: Patient seen and examined at his bedside uses CPAP overnight.  States his overall body aches is improving.  Admits to intermittent nonproductive cough.    Assessment/Plan: Principal Problem:   Sepsis ([place]) Active Problems:   Gout   Depression   Hypertensive heart disease with CHF (congestive heart failure) ([name])   Gastro-esophageal reflux disease without esophagitis   Hyperparathyroidism, secondary ([place])   CAD S/P percutaneous coronary angioplasty   OSA (obstructive sleep apnea)   HLD (hyperlipidemia)   Hyperthyroidism   Neuropathy due to secondary diabetes mellitus ([place])   DM (diabetes mellitus) type II controlled with renal manifestation ([name])   ESRD on hemodialysis ([place])   Essential hypertension  Sepsis 2/2 to klebsiella pneumonia bacteremia, unclear etiology  procalcitonin 16 on 07/18/2018 Repeat procalcitonin tomorrow 07/20/2018 Continue Rocephin day #2 Monitor fever curve and WBC Obtain CBC in the morning Repeated blood cultures peripherally x2 done on 07/18/18 revealed no growth in less than 24 hours  ESRD on hemodialysis Tuesday Thursday Saturday Continue to monitor electrolytes Hemodialysis planned today 07/19/2018 Renal dialysis diet  Anemia of chronic disease the setting of ESRD  Hemoglobin stable No sign of overt bleeding  Non-insulin-dependent type 2 diabetes A1c 5.7 on 07/16/2018 Avoid hypoglycemia Insulin sliding scale as needed for hyperglycemia  Hyperkalemia suspect secondary to advanced renal failure Improving Potassium 5.1 from 6.3 on admission  Abnormal LFTs post cholecystectomy Currently asymptomatic Repeat chemistry panel in the  morning  Combined diastolic and systolic chronic CHF/ Last 2D echo done on 10/17/2016 revealed LVEF 40 to 45%, basal inferior wall hypokinesis with grade 2 diastolic dysfunction No acute issues at this time Continue cardiac medications  Coronary artery disease/ischemic cardiomyopathy status post stenting Continue cardiac medications Continue antiplatelet and antilipid  Hyperthyroidism Continue methimazole  OSA Continue CPAP at night      Code Status: Full code  Family Communication: None at bedside  Disposition Plan: Home possibly in 1 to 2 days or when nephrology signs off.   Consultants:  Nephrology  Procedures:  None  Antimicrobials:  IV Rocephin  DVT prophylaxis:  hep sq 5000 u tid   Objective: Vitals:   07/18/18 1942 07/18/18 2306 07/19/18 0345 07/19/18 0734  BP: 126/61 (!) 143/77 133/65 139/65  Pulse: 77 77 79 70  Resp: 17 16 18 20   Temp: 97.8 F (36.6 C) 97.8 F (36.6 C) 98 F (36.7 C) (!) 97.3 F (36.3 C)  TempSrc: Oral Oral Oral Oral  SpO2: 95% 96% 99% 95%  Weight:      Height:        Intake/Output Summary (Last 24 hours) at 07/19/2018 1219 Last data filed at 07/18/2018 1800 Gross per 24 hour  Intake 574.57 ml  Output -  Net 574.57 ml   Filed Weights   07/16/18 1455 07/17/18 1337 07/17/18 1800  Weight: 100.7 kg 101.3 kg 100.3 kg    Exam:  . General: 67 y.o. year-old male well-developed well-nourished in no acute distress.  Alert and oriented x3.   . Cardiovascular: Regular rate and rhythm with no rubs or gallops.  No JVD or thyromegaly noted. [name] Respiratory: Clear to auscultation with no wheezes or rales. Good inspiratory effort. . Abdomen: Soft nontender nondistended with normal bowel sounds x4 quadrants. . Musculoskeletal: No lower extremity edema. 2/4 pulses in all 4 extremities.  Right upper extremity AV fistula. . Skin: No ulcerative lesions noted or rashes . Psychiatry: Mood is appropriate for condition and  setting   Data Reviewed: CBC: Recent Labs  Lab 07/16/18 1453 07/16/18 1501 07/17/18 0614 07/18/18 0915 07/18/18 2214  WBC 9.8  --  7.7 5.3 5.8  NEUTROABS 8.0*  --   --  3.2  --   HGB 12.2* 13.9  13.9 11.1* 11.2* 10.9*  HCT 41.1 41.0  41.0 36.4* 36.9* 36.1*  MCV 102.0*  --  100.0 99.2 99.2  PLT 288  --  261 273 831   Basic Metabolic Panel: Recent Labs  Lab 07/16/18 1453 07/16/18 1501 07/16/18 2235 07/17/18 0614 07/18/18 2214  NA 140 137  137  --  139 140  K 5.2* 6.3*  6.3* 5.4* 5.1 4.1  CL 98 102  102  --  101 98  CO2 27  --   --  26 27  GLUCOSE 196* 194*  194*  --  125* 111*  BUN 41* 61*  61*  --  50* 30*  CREATININE 10.02* 10.70*  10.70*  --  11.22* 8.36*  CALCIUM 8.8*  --   --  8.3* 9.0  PHOS  --   --   --   --  5.1*   GFR: Estimated Creatinine Clearance:  10.4 mL/min (A) (by C-G formula based on SCr of 8.36 mg/dL (H)). Liver Function Tests: Recent Labs  Lab 07/16/18 1453 07/17/18 0614 07/18/18 2214  AST 193* 83*  --   ALT 231* 158*  --   ALKPHOS 230* 197*  --   BILITOT 2.8* 2.4*  --   PROT 7.6 6.8  --   ALBUMIN 3.3* 2.9* 2.7*   No results for input(s): LIPASE, AMYLASE in the last 168 hours. No results for input(s): AMMONIA in the last 168 hours. Coagulation Profile: Recent Labs  Lab 07/16/18 1453 07/16/18 2235  INR 1.09 1.12   Cardiac Enzymes: Recent Labs  Lab 07/16/18 2235  CKTOTAL 131   BNP (last 3 results) No results for input(s): PROBNP in the last 8760 hours. HbA1C: Recent Labs    07/16/18 2235  HGBA1C 5.7*   CBG: Recent Labs  Lab 07/18/18 1152 07/18/18 1617 07/18/18 2131 07/19/18 0635 07/19/18 1129  GLUCAP 98 93 85 113* 132*   Lipid Profile: No results for input(s): CHOL, HDL, LDLCALC, TRIG, CHOLHDL, LDLDIRECT in the last 72 hours. Thyroid Function Tests: No results for input(s): TSH, T4TOTAL, FREET4, T3FREE, THYROIDAB in the last 72 hours. Anemia Panel: No results for input(s): VITAMINB12, FOLATE, FERRITIN,  TIBC, IRON, RETICCTPCT in the last 72 hours. Urine analysis:    Component Value Date/Time   COLORURINE LT. YELLOW 06/26/2013 1200   APPEARANCEUR CLEAR 06/26/2013 1200   LABSPEC 1.020 06/26/2013 1200   PHURINE 8.5 06/26/2013 1200   GLUCOSEU 100 06/26/2013 1200   HGBUR MODERATE 06/26/2013 1200   BILIRUBINUR NEGATIVE 06/26/2013 1200   KETONESUR NEGATIVE 06/26/2013 1200   PROTEINUR >300 (A) 01/04/2010 1032   UROBILINOGEN 0.2 06/26/2013 1200   NITRITE NEGATIVE 06/26/2013 1200   LEUKOCYTESUR TRACE 06/26/2013 1200   Sepsis Labs: @LABRCNTIP (procalcitonin:4,lacticidven:4)  ) Recent Results (from the past 240 hour(s))  Blood Culture (routine x 2)     Status: Abnormal   Collection Time: 07/16/18  2:54 PM  Result Value Ref Range Status   Specimen Description BLOOD LEFT ANTECUBITAL  Final   Special Requests   Final    BOTTLES DRAWN AEROBIC AND ANAEROBIC Blood Culture results may not be optimal due to an inadequate volume of blood received in culture bottles   Culture  Setup Time   Final    GRAM NEGATIVE RODS ANAEROBIC BOTTLE ONLY CRITICAL RESULT CALLED TO, READ BACK BY AND VERIFIED WITH: E. MARTIN PHARMD, AT 1058 07/17/18 BY D. VANHOOK AEROBIC BOTTLE ONLY GRAM POSITIVE COCCI Organism ID to follow CRITICAL RESULT CALLED TO, READ BACK BY AND VERIFIED WITH: V BRYK PHARMD 07/17/18 2359 JDW CORRECTED RESULTS PREVIOUSLY REPORTED AS: GRAM POSITIVE RODS CORRECTED RESULTS CALLED TO: PHARMD R RUMBERG 07/18/18 AT 1315 BY CM    Culture (A)  Final    KLEBSIELLA PNEUMONIAE STAPHYLOCOCCUS SPECIES (COAGULASE NEGATIVE) THE SIGNIFICANCE OF ISOLATING THIS ORGANISM FROM A SINGLE SET OF BLOOD CULTURES WHEN MULTIPLE SETS ARE DRAWN IS UNCERTAIN. PLEASE NOTIFY THE MICROBIOLOGY DEPARTMENT WITHIN ONE WEEK IF SPECIATION AND SENSITIVITIES ARE REQUIRED. Performed at [facility], [place] [address], [place], [place] [address]    Report Status 07/19/2018 FINAL  Final   Organism ID, Bacteria KLEBSIELLA  PNEUMONIAE  Final      Susceptibility   Klebsiella pneumoniae - MIC*    AMPICILLIN >=32 RESISTANT Resistant     CEFAZOLIN <=4 SENSITIVE Sensitive     CEFEPIME <=1 SENSITIVE Sensitive     CEFTAZIDIME <=1 SENSITIVE Sensitive     CEFTRIAXONE <=1 SENSITIVE  Sensitive     CIPROFLOXACIN <=0.25 SENSITIVE Sensitive     GENTAMICIN <=1 SENSITIVE Sensitive     IMIPENEM <=0.25 SENSITIVE Sensitive     TRIMETH/SULFA <=20 SENSITIVE Sensitive     AMPICILLIN/SULBACTAM 4 SENSITIVE Sensitive     PIP/TAZO <=4 SENSITIVE Sensitive     Extended ESBL NEGATIVE Sensitive     * KLEBSIELLA PNEUMONIAE  Blood Culture ID Panel (Reflexed)     Status: Abnormal   Collection Time: 07/16/18  2:54 PM  Result Value Ref Range Status   Enterococcus species NOT DETECTED NOT DETECTED Final   Listeria monocytogenes NOT DETECTED NOT DETECTED Final   Staphylococcus species DETECTED (A) NOT DETECTED Final    Comment: Methicillin (oxacillin) susceptible coagulase negative staphylococcus. Possible blood culture contaminant (unless isolated from more than one blood culture draw or clinical case suggests pathogenicity). No antibiotic treatment is indicated for blood  culture contaminants. CRITICAL RESULT CALLED TO, READ BACK BY AND VERIFIED WITH: V BRYK PHARMD 07/17/18 2359 JDW    Staphylococcus aureus (BCID) NOT DETECTED NOT DETECTED Final   Methicillin resistance NOT DETECTED NOT DETECTED Final   Streptococcus species NOT DETECTED NOT DETECTED Final   Streptococcus agalactiae NOT DETECTED NOT DETECTED Final   Streptococcus pneumoniae NOT DETECTED NOT DETECTED Final   Streptococcus pyogenes NOT DETECTED NOT DETECTED Final   Acinetobacter baumannii NOT DETECTED NOT DETECTED Final   Enterobacteriaceae species NOT DETECTED NOT DETECTED Final   Enterobacter cloacae complex NOT DETECTED NOT DETECTED Final   Escherichia coli NOT DETECTED NOT DETECTED Final   Klebsiella oxytoca NOT DETECTED NOT DETECTED Final   Klebsiella pneumoniae  NOT DETECTED NOT DETECTED Final   Proteus species NOT DETECTED NOT DETECTED Final   Serratia marcescens NOT DETECTED NOT DETECTED Final   Haemophilus influenzae NOT DETECTED NOT DETECTED Final   Neisseria meningitidis NOT DETECTED NOT DETECTED Final   Pseudomonas aeruginosa NOT DETECTED NOT DETECTED Final   Candida albicans NOT DETECTED NOT DETECTED Final   Candida glabrata NOT DETECTED NOT DETECTED Final   Candida krusei NOT DETECTED NOT DETECTED Final   Candida parapsilosis NOT DETECTED NOT DETECTED Final   Candida tropicalis NOT DETECTED NOT DETECTED Final    Comment: Performed at [facility], [place] [address], [place], [place] [address]  Blood Culture (routine x 2)     Status: None (Preliminary result)   Collection Time: 07/16/18  3:24 PM  Result Value Ref Range Status   Specimen Description BLOOD LEFT ARM  Final   Special Requests   Final    BOTTLES DRAWN AEROBIC AND ANAEROBIC Blood Culture adequate volume   Culture   Final    NO GROWTH 3 DAYS Performed at [facility], [address], [place], [place] [address]    Report Status PENDING  Incomplete  Culture, blood (routine x 2)     Status: None (Preliminary result)   Collection Time: 07/18/18 11:39 AM  Result Value Ref Range Status   Specimen Description BLOOD SITE NOT SPECIFIED  Final   Special Requests   Final    BOTTLES DRAWN AEROBIC AND ANAEROBIC Blood Culture adequate volume   Culture   Final    NO GROWTH < 24 HOURS Performed at [facility], [address] [place], [place] [address]    Report Status PENDING  Incomplete  Culture, blood (routine x 2)     Status: None (Preliminary result)   Collection Time: 07/18/18 11:41 AM  Result Value Ref Range Status  Specimen Description BLOOD LEFT ANTECUBITAL  Final   Special Requests   Final    BOTTLES DRAWN AEROBIC AND ANAEROBIC Blood Culture adequate volume   Culture   Final    NO GROWTH < 24 HOURS Performed at [facility], [address],  [place], [place] [address]    Report Status PENDING  Incomplete  MRSA PCR Screening     Status: None   Collection Time: 07/18/18 12:16 PM  Result Value Ref Range Status   MRSA by PCR NEGATIVE NEGATIVE Final    Comment:        The GeneXpert MRSA Assay (FDA approved for NASAL specimens only), is one component of a comprehensive MRSA colonization surveillance program. It is not intended to diagnose MRSA infection nor to guide or monitor treatment for MRSA infections. Performed at [facility], [place] [address], [place],  [address]       Studies: No results found.  Scheduled Meds: . allopurinol  100 mg Oral Daily  . aspirin EC  81 mg Oral Daily  . Chlorhexidine Gluconate Cloth  6 each Topical Q0600  . cinacalcet  90 mg Oral BID WC  . citalopram  10 mg Oral QHS  . colesevelam  1,875 mg Oral BID WC  . doxercalciferol  4 mcg Intravenous Q T,Th,Sa-HD  . ezetimibe  10 mg Oral Daily  . ferric citrate  420 mg Oral BID WC  . gabapentin  600 mg Oral QHS  . heparin  5,000 Units Subcutaneous Q8H  . insulin aspart  0-15 Units Subcutaneous TID WC  . insulin aspart  0-5 Units Subcutaneous QHS  . methimazole  2.5 mg Oral Daily  . multivitamin  1 tablet Oral QHS  . pantoprazole  40 mg Oral QPM  . saccharomyces boulardii  250 mg Oral BID    Continuous Infusions: . cefTRIAXone (ROCEPHIN)  IV Stopped (07/18/18 1643)     LOS: 3 days     Kayleen Memos, MD [facility] Pager [phone]  If 7PM-7AM, please contact night-coverage [email or web address] Password [email or web address] 07/19/2018, 12:19 PM

## 2018-07-19 NOTE — Progress Notes (Signed)
[facility] Progress Note  Subjective: feeling some what better, aches/ pain improved.  Blood cx 1/2 + for klebs pna and also 1/2 + for coag neg staph     Vitals:   07/18/18 2306 07/19/18 0345 07/19/18 0734 07/19/18 1251  BP: (!) 143/77 133/65 139/65 137/72  Pulse: 77 79 70 73  Resp: 16 18 20 20   Temp: 97.8 F (36.6 C) 98 F (36.7 C) (!) 97.3 F (36.3 C) 97.6 F (36.4 C)  TempSrc: Oral Oral Oral Oral  SpO2: 96% 99% 95% 98%  Weight:      Height:        Inpatient medications: . allopurinol  100 mg Oral Daily  . aspirin EC  81 mg Oral Daily  . Chlorhexidine Gluconate Cloth  6 each Topical Q0600  . cinacalcet  90 mg Oral BID WC  . citalopram  10 mg Oral QHS  . colesevelam  1,875 mg Oral BID WC  . doxercalciferol  4 mcg Intravenous Q T,Th,Sa-HD  . ezetimibe  10 mg Oral Daily  . ferric citrate  420 mg Oral BID WC  . gabapentin  600 mg Oral QHS  . heparin  5,000 Units Subcutaneous Q8H  . insulin aspart  0-15 Units Subcutaneous TID WC  . insulin aspart  0-5 Units Subcutaneous QHS  . methimazole  2.5 mg Oral Daily  . multivitamin  1 tablet Oral QHS  . pantoprazole  40 mg Oral QPM  . saccharomyces boulardii  250 mg Oral BID   . cefTRIAXone (ROCEPHIN)  IV Stopped (07/18/18 1643)   acetaminophen, alteplase, ferric citrate, heparin, hydroxypropyl methylcellulose / hypromellose, lidocaine (PF), lidocaine-prilocaine, polyethylene glycol, zolpidem  Iron/TIBC/Ferritin/ %Sat    Component Value Date/Time   IRON 16 (L) 06/12/2018 0500   TIBC 168 (L) 06/12/2018 0500   FERRITIN 709 (H) 10/17/2016 1103   IRONPCTSAT 10 (L) 06/12/2018 0500    Exam: Gen alert  No rash, cyanosis or gangrene Sclera anicteric, throat clear  No jvd or bruits Chest clear bilat no rales or wheeznig RRR no MRG Abd soft ntnd no mass or ascites +bs GU normal male MS no joint effusions or deformity Ext no LE edema, no wounds or ulcers Neuro is alert, Ox 3 , nf R upper arm AVF  +bruit    Home meds:  - allopurinol 100/ aspirin 81/  pantoprazole 40/ ferric citrate tid ac/ methimazole 2.5 qd  - ezetimibe 10 qd/ alirocumab 150 mg q 2wks/ welchol tid 325  - citalopram 10 hs/ gabapentin 600 hs  - midodrine 10 mg pre HD TTS  - florastor bid/ MVI/ prns/ vitamins  Dialysis: TTS   4h  350/800  100kg  2/2.25 bath  P2  Hep none   RUA AVF  - hect 4  - mircera 200 last 10/10     Impression/ Plan: 1. Sepsis 2/2 klebsiella bacteremia - on IV abx per primary. RUA AVF no gross signs of infection. 2. ESRD HD TTS.  HD today.  3. Hypotension - chronic , takes midodrine pre HD 4. Anemia ckd - Hb 11, observe 5. CAD hx stenting/ CM EF 40-45% 6. MBD ckd - cont meds    Kelly Splinter MD [facility] pager [phone]   07/19/2018, 1:18 PM   Recent Labs  Lab 07/16/18 1453  07/16/18 2235 07/17/18 0614 07/18/18 2214  NA 140   < >  --  139 140  K 5.2*   < > 5.4* 5.1 4.1  CL 98   < >  --  101 98  CO2 27  --   --  26 27  GLUCOSE 196*   < >  --  125* 111*  BUN 41*   < >  --  50* 30*  CREATININE 10.02*   < >  --  11.22* 8.36*  CALCIUM 8.8*  --   --  8.3* 9.0  PHOS  --   --   --   --  5.1*  ALBUMIN 3.3*  --   --  2.9* 2.7*  INR 1.09  --  1.12  --   --    < > = values in this interval not displayed.   Recent Labs  Lab 07/16/18 1453 07/17/18 0614  AST 193* 83*  ALT 231* 158*  ALKPHOS 230* 197*  BILITOT 2.8* 2.4*  PROT 7.6 6.8   Recent Labs  Lab 07/16/18 1453  07/18/18 0915 07/18/18 2214  WBC 9.8   < > 5.3 5.8  NEUTROABS 8.0*  --  3.2  --   HGB 12.2*   < > 11.2* 10.9*  HCT 41.1   < > 36.9* 36.1*  MCV 102.0*   < > 99.2 99.2  PLT 288   < > 273 249   < > = values in this interval not displayed.

## 2018-07-20 LAB — CBC
HCT: 35.1 % — ABNORMAL LOW (ref 39.0–52.0)
HEMOGLOBIN: 10.4 g/dL — AB (ref 13.0–17.0)
MCH: 29.3 pg (ref 26.0–34.0)
MCHC: 29.6 g/dL — AB (ref 30.0–36.0)
MCV: 98.9 fL (ref 80.0–100.0)
PLATELETS: 273 10*3/uL (ref 150–400)
RBC: 3.55 MIL/uL — ABNORMAL LOW (ref 4.22–5.81)
RDW: 19.1 % — ABNORMAL HIGH (ref 11.5–15.5)
WBC: 5.8 10*3/uL (ref 4.0–10.5)
nRBC: 0 % (ref 0.0–0.2)

## 2018-07-20 LAB — GLUCOSE, CAPILLARY
Glucose-Capillary: 114 mg/dL — ABNORMAL HIGH (ref 70–99)
Glucose-Capillary: 135 mg/dL — ABNORMAL HIGH (ref 70–99)

## 2018-07-20 LAB — BASIC METABOLIC PANEL
Anion gap: 15 (ref 5–15)
BUN: 38 mg/dL — AB (ref 8–23)
CHLORIDE: 101 mmol/L (ref 98–111)
CO2: 23 mmol/L (ref 22–32)
CREATININE: 10.79 mg/dL — AB (ref 0.61–1.24)
Calcium: 8.7 mg/dL — ABNORMAL LOW (ref 8.9–10.3)
GFR calc Af Amer: 5 mL/min — ABNORMAL LOW (ref >60)
GFR calc non Af Amer: 4 mL/min — ABNORMAL LOW (ref >60)
Glucose, Bld: 115 mg/dL — ABNORMAL HIGH (ref 70–99)
Potassium: 4.7 mmol/L (ref 3.5–5.1)
Sodium: 139 mmol/L (ref 135–145)

## 2018-07-20 LAB — PROCALCITONIN: PROCALCITONIN: 8.81 ng/mL

## 2018-07-20 MED ORDER — DOXERCALCIFEROL 4 MCG/2ML IV SOLN
INTRAVENOUS | Status: AC
  Administered 2018-07-20: 4 ug via INTRAVENOUS
  Filled 2018-07-20: qty 2

## 2018-07-20 NOTE — Progress Notes (Signed)
Progress Notes         PROGRESS NOTE  [name] IWP:[id number] DOB: 1951-03-17 DOA: 07/16/2018 PCP: Biagio Borg, MD  HPI/Recap of past 24 hours: [name] is a 67 y.o. male with medical history significant for DM2 with neuropathy, HTN, ESRD on HD via RUE AVF (last session yesterday at [facility]), ischemic cardiomyopathy, chronic combined systolic and diastolic CHF, OSA on CPAP, CAD s/p NSTEMI, hyperthyroidism, HLD, gout, depression who was brought to the ED today after he became lethargic and not as responsive when on his way to get a TTE. States that last night he went to bed early because he was weak, aching "all over," especially in his back and bilateral thighs. He did not take his temp and is unsure if he had a fever at that time. This morning he still felt bad, but made it to his cardiology appt where he started feeling even more weak and lethargic. His wife said he was not responding to her for a few seconds/minutes. He was slumped in his chair. He was brought to the ED. He denies pain currently. States his pain had resolved by the time he got to the ED.  He was admitted to the hospital here about 4 weeks ago when he presented with fevers/chills and dyspnea and found to have PNA. He was treated with 5 days of IV Levaquin. Blood cultures were negative at that time. He does not make urine.   He was awake and alert upon arrival to ED. Rectal temp was 101.4. He was tachycardic at 107, BP 111/84 which trended to 85/56 and then back to 102/61. RR was 17-24. SaO2 was 91-99% on RA. His lactate was elevated at ~2.5. K was 5.2 -> 6.3. CXR showed resolved R lung base opacity and no acute CP disease. CTAP noncontrast revealed nothing to suggest infection; showed bilateral nonobstructing kidney stones, prior posterior lumbar fusion at L4-5 with well incorporated interbody block and interbody disc placed at L3-4.    07/17/2017: Patient seen and examined at his bedside.  States he feels better.   His lower back pain is improving.  Denies any nausea or abdominal pain.  Scheduled for hemodialysis today.  1 of 2 bottles of blood culture grew gram-positive rods.  Possible contaminant.  07/18/18: seen and examined at bedside. No acute events overnight. 1/2 bottle positive for klebsiella.  07/19/2018: Patient seen and examined at his bedside uses CPAP overnight.  States his overall body aches is improving.  Admits to intermittent nonproductive cough.  07/20/2018: Patient seen and examined at his bedside.  No acute events overnight.  States his lower back pain is improving.  Missed hemodialysis yesterday due to staffing.  Hemoglobin plan today per nephrology.    Assessment/Plan: Principal Problem:   Sepsis ([place]) Active Problems:   Gout   Depression   Hypertensive heart disease with CHF (congestive heart failure) ([name])   Gastro-esophageal reflux disease without esophagitis   Hyperparathyroidism, secondary ([place])   CAD S/P percutaneous coronary angioplasty   OSA (obstructive sleep apnea)   HLD (hyperlipidemia)   Hyperthyroidism   Neuropathy due to secondary diabetes mellitus ([place])   DM (diabetes mellitus) type II controlled with renal manifestation ([name])   ESRD on hemodialysis ([place])   Essential hypertension  Sepsis 2/2 to klebsiella pneumonia bacteremia, unclear etiology  procalcitonin 16 on 07/18/2018 Repeated procalcitonin done on 07/20/2018 is trending down from 16 to 8 Continue Rocephin day #3 Monitor fever curve and WBC Obtain CBC in the morning  Repeated blood cultures peripherally x2 done on 07/18/18 revealed no growth to date  ESRD on hemodialysis Tuesday Thursday Saturday Continue to monitor electrolytes Hemodialysis planned today 07/20/2018 Missed scheduled hemodialysis yesterday due to staffing Renal dialysis diet Nephrology following  Anemia of chronic disease the setting of ESRD Hemoglobin stable No sign of overt bleeding  Non-insulin-dependent type 2  diabetes A1c 5.7 on 07/16/2018 Avoid hypoglycemia Insulin sliding scale as needed for hyperglycemia  Resolving hyperkalemia suspect secondary to advanced renal failure Nephrology following Hemodialysis plan today 07/20/2018  Abnormal LFTs post cholecystectomy Currently asymptomatic Repeat chemistry panel in the morning  Combined diastolic and systolic chronic CHF/ Last 2D echo done on 10/17/2016 revealed LVEF 40 to 45%, basal inferior wall hypokinesis with grade 2 diastolic dysfunction No acute issues at this time Continue cardiac medications  Coronary artery disease/ischemic cardiomyopathy status post stenting Continue cardiac medications Continue antiplatelet and antilipid  Hyperthyroidism Continue methimazole  OSA Continue CPAP at night      Code Status: Full code  Family Communication: None at bedside  Disposition Plan: Home possibly in 1 to 2 days or when nephrology signs off.   Consultants:  Nephrology  Procedures:  None  Antimicrobials:  IV Rocephin  DVT prophylaxis:  hep sq 5000 u tid   Objective: Vitals:   07/20/18 0400 07/20/18 0500 07/20/18 0600 07/20/18 0700  BP:      Pulse:      Resp: 14 14 15  (!) 21  Temp:      TempSrc:      SpO2:      Weight:      Height:       No intake or output data in the 24 hours ending 07/20/18 1207 Filed Weights   07/16/18 1455 07/17/18 1337 07/17/18 1800  Weight: 100.7 kg 101.3 kg 100.3 kg    Exam:  . General: 67 y.o. year-old male well-developed well-nourished in no acute distress.  Alert and oriented x3.   . Cardiovascular: Regular rate and rhythm with no rubs or gallops.  No JVD or thyromegaly noted. [name] Respiratory: Lungs are clear to auscultation with no wheezes or rales. Good inspiratory effort. . Abdomen: Soft nontender nondistended with normal bowel sounds x4 quadrants. . Musculoskeletal: Trace lower extremity edema. 2/4 pulses in all 4 extremities.  Right upper extremity AV fistula. . Skin:  No ulcerative lesions noted or rashes . Psychiatry: Mood is appropriate for condition and setting   Data Reviewed: CBC: Recent Labs  Lab 07/16/18 1453 07/16/18 1501 07/17/18 0614 07/18/18 0915 07/18/18 2214 07/20/18 0508  WBC 9.8  --  7.7 5.3 5.8 5.8  NEUTROABS 8.0*  --   --  3.2  --   --   HGB 12.2* 13.9  13.9 11.1* 11.2* 10.9* 10.4*  HCT 41.1 41.0  41.0 36.4* 36.9* 36.1* 35.1*  MCV 102.0*  --  100.0 99.2 99.2 98.9  PLT 288  --  261 273 249 734   Basic Metabolic Panel: Recent Labs  Lab 07/16/18 1453 07/16/18 1501 07/16/18 2235 07/17/18 0614 07/18/18 2214 07/20/18 0508  NA 140 137  137  --  139 140 139  K 5.2* 6.3*  6.3* 5.4* 5.1 4.1 4.7  CL 98 102  102  --  101 98 101  CO2 27  --   --  26 27 23   GLUCOSE 196* 194*  194*  --  125* 111* 115*  BUN 41* 61*  61*  --  50* 30* 38*  CREATININE 10.02* 10.70*  10.70*  --  11.22* 8.36* 10.79*  CALCIUM 8.8*  --   --  8.3* 9.0 8.7*  PHOS  --   --   --   --  5.1*  --    GFR: Estimated Creatinine Clearance: 8.1 mL/min (A) (by C-G formula based on SCr of 10.79 mg/dL (H)). Liver Function Tests: Recent Labs  Lab 07/16/18 1453 07/17/18 0614 07/18/18 2214  AST 193* 83*  --   ALT 231* 158*  --   ALKPHOS 230* 197*  --   BILITOT 2.8* 2.4*  --   PROT 7.6 6.8  --   ALBUMIN 3.3* 2.9* 2.7*   No results for input(s): LIPASE, AMYLASE in the last 168 hours. No results for input(s): AMMONIA in the last 168 hours. Coagulation Profile: Recent Labs  Lab 07/16/18 1453 07/16/18 2235  INR 1.09 1.12   Cardiac Enzymes: Recent Labs  Lab 07/16/18 2235  CKTOTAL 131   BNP (last 3 results) No results for input(s): PROBNP in the last 8760 hours. HbA1C: No results for input(s): HGBA1C in the last 72 hours. CBG: Recent Labs  Lab 07/19/18 0635 07/19/18 1129 07/19/18 1649 07/19/18 2102 07/20/18 0623  GLUCAP 113* 132* 105* 123* 114*   Lipid Profile: No results for input(s): CHOL, HDL, LDLCALC, TRIG, CHOLHDL, LDLDIRECT in  the last 72 hours. Thyroid Function Tests: No results for input(s): TSH, T4TOTAL, FREET4, T3FREE, THYROIDAB in the last 72 hours. Anemia Panel: No results for input(s): VITAMINB12, FOLATE, FERRITIN, TIBC, IRON, RETICCTPCT in the last 72 hours. Urine analysis:    Component Value Date/Time   COLORURINE LT. YELLOW 06/26/2013 1200   APPEARANCEUR CLEAR 06/26/2013 1200   LABSPEC 1.020 06/26/2013 1200   PHURINE 8.5 06/26/2013 1200   GLUCOSEU 100 06/26/2013 1200   HGBUR MODERATE 06/26/2013 1200   BILIRUBINUR NEGATIVE 06/26/2013 1200   KETONESUR NEGATIVE 06/26/2013 1200   PROTEINUR >300 (A) 01/04/2010 1032   UROBILINOGEN 0.2 06/26/2013 1200   NITRITE NEGATIVE 06/26/2013 1200   LEUKOCYTESUR TRACE 06/26/2013 1200   Sepsis Labs: @LABRCNTIP (procalcitonin:4,lacticidven:4)  ) Recent Results (from the past 240 hour(s))  Blood Culture (routine x 2)     Status: Abnormal   Collection Time: 07/16/18  2:54 PM  Result Value Ref Range Status   Specimen Description BLOOD LEFT ANTECUBITAL  Final   Special Requests   Final    BOTTLES DRAWN AEROBIC AND ANAEROBIC Blood Culture results may not be optimal due to an inadequate volume of blood received in culture bottles   Culture  Setup Time   Final    GRAM NEGATIVE RODS ANAEROBIC BOTTLE ONLY CRITICAL RESULT CALLED TO, READ BACK BY AND VERIFIED WITH: E. MARTIN PHARMD, AT 1058 07/17/18 BY D. VANHOOK AEROBIC BOTTLE ONLY GRAM POSITIVE COCCI Organism ID to follow CRITICAL RESULT CALLED TO, READ BACK BY AND VERIFIED WITH: V BRYK PHARMD 07/17/18 2359 JDW CORRECTED RESULTS PREVIOUSLY REPORTED AS: GRAM POSITIVE RODS CORRECTED RESULTS CALLED TO: PHARMD R RUMBERG 07/18/18 AT 1315 BY CM    Culture (A)  Final    KLEBSIELLA PNEUMONIAE STAPHYLOCOCCUS SPECIES (COAGULASE NEGATIVE) THE SIGNIFICANCE OF ISOLATING THIS ORGANISM FROM A SINGLE SET OF BLOOD CULTURES WHEN MULTIPLE SETS ARE DRAWN IS UNCERTAIN. PLEASE NOTIFY THE MICROBIOLOGY DEPARTMENT WITHIN ONE WEEK IF  SPECIATION AND SENSITIVITIES ARE REQUIRED. Performed at [facility], [place] [address] [place], [place] [address]    Report Status 07/19/2018 FINAL  Final   Organism ID, Bacteria KLEBSIELLA PNEUMONIAE  Final      Susceptibility   Klebsiella pneumoniae - MIC*  AMPICILLIN >=32 RESISTANT Resistant     CEFAZOLIN <=4 SENSITIVE Sensitive     CEFEPIME <=1 SENSITIVE Sensitive     CEFTAZIDIME <=1 SENSITIVE Sensitive     CEFTRIAXONE <=1 SENSITIVE Sensitive     CIPROFLOXACIN <=0.25 SENSITIVE Sensitive     GENTAMICIN <=1 SENSITIVE Sensitive     IMIPENEM <=0.25 SENSITIVE Sensitive     TRIMETH/SULFA <=20 SENSITIVE Sensitive     AMPICILLIN/SULBACTAM 4 SENSITIVE Sensitive     PIP/TAZO <=4 SENSITIVE Sensitive     Extended ESBL NEGATIVE Sensitive     * KLEBSIELLA PNEUMONIAE  Blood Culture ID Panel (Reflexed)     Status: Abnormal   Collection Time: 07/16/18  2:54 PM  Result Value Ref Range Status   Enterococcus species NOT DETECTED NOT DETECTED Final   Listeria monocytogenes NOT DETECTED NOT DETECTED Final   Staphylococcus species DETECTED (A) NOT DETECTED Final    Comment: Methicillin (oxacillin) susceptible coagulase negative staphylococcus. Possible blood culture contaminant (unless isolated from more than one blood culture draw or clinical case suggests pathogenicity). No antibiotic treatment is indicated for blood  culture contaminants. CRITICAL RESULT CALLED TO, READ BACK BY AND VERIFIED WITH: V BRYK PHARMD 07/17/18 2359 JDW    Staphylococcus aureus (BCID) NOT DETECTED NOT DETECTED Final   Methicillin resistance NOT DETECTED NOT DETECTED Final   Streptococcus species NOT DETECTED NOT DETECTED Final   Streptococcus agalactiae NOT DETECTED NOT DETECTED Final   Streptococcus pneumoniae NOT DETECTED NOT DETECTED Final   Streptococcus pyogenes NOT DETECTED NOT DETECTED Final   Acinetobacter baumannii NOT DETECTED NOT DETECTED Final   Enterobacteriaceae species NOT DETECTED NOT DETECTED  Final   Enterobacter cloacae complex NOT DETECTED NOT DETECTED Final   Escherichia coli NOT DETECTED NOT DETECTED Final   Klebsiella oxytoca NOT DETECTED NOT DETECTED Final   Klebsiella pneumoniae NOT DETECTED NOT DETECTED Final   Proteus species NOT DETECTED NOT DETECTED Final   Serratia marcescens NOT DETECTED NOT DETECTED Final   Haemophilus influenzae NOT DETECTED NOT DETECTED Final   Neisseria meningitidis NOT DETECTED NOT DETECTED Final   Pseudomonas aeruginosa NOT DETECTED NOT DETECTED Final   Candida albicans NOT DETECTED NOT DETECTED Final   Candida glabrata NOT DETECTED NOT DETECTED Final   Candida krusei NOT DETECTED NOT DETECTED Final   Candida parapsilosis NOT DETECTED NOT DETECTED Final   Candida tropicalis NOT DETECTED NOT DETECTED Final    Comment: Performed at [facility], [place] [address] [place], [place] [address]  Blood Culture (routine x 2)     Status: None (Preliminary result)   Collection Time: 07/16/18  3:24 PM  Result Value Ref Range Status   Specimen Description BLOOD LEFT ARM  Final   Special Requests   Final    BOTTLES DRAWN AEROBIC AND ANAEROBIC Blood Culture adequate volume   Culture   Final    NO GROWTH 3 DAYS Performed at [facility], [address], [place], [place] [address]    Report Status PENDING  Incomplete  Culture, blood (routine x 2)     Status: None (Preliminary result)   Collection Time: 07/18/18 11:39 AM  Result Value Ref Range Status   Specimen Description BLOOD SITE NOT SPECIFIED  Final   Special Requests   Final    BOTTLES DRAWN AEROBIC AND ANAEROBIC Blood Culture adequate volume   Culture   Final    NO GROWTH 1 DAY Performed at [facility], [place] [address] [place], [place] [address]  Report Status PENDING  Incomplete  Culture, blood (routine x 2)     Status: None (Preliminary result)   Collection Time: 07/18/18 11:41 AM  Result Value Ref Range Status   Specimen Description BLOOD LEFT ANTECUBITAL  Final    Special Requests   Final    BOTTLES DRAWN AEROBIC AND ANAEROBIC Blood Culture adequate volume   Culture   Final    NO GROWTH 1 DAY Performed at [facility], [address] [place], [place] [address]    Report Status PENDING  Incomplete  MRSA PCR Screening     Status: None   Collection Time: 07/18/18 12:16 PM  Result Value Ref Range Status   MRSA by PCR NEGATIVE NEGATIVE Final    Comment:        The GeneXpert MRSA Assay (FDA approved for NASAL specimens only), is one component of a comprehensive MRSA colonization surveillance program. It is not intended to diagnose MRSA infection nor to guide or monitor treatment for MRSA infections. Performed at [facility], [place] [address] [place],  [address]       Studies: No results found.  Scheduled Meds: . allopurinol  100 mg Oral Daily  . aspirin EC  81 mg Oral Daily  . Chlorhexidine Gluconate Cloth  6 each Topical Q0600  . cinacalcet  90 mg Oral BID WC  . citalopram  10 mg Oral QHS  . colesevelam  1,875 mg Oral BID WC  . doxercalciferol  4 mcg Intravenous Q T,Th,Sa-HD  . ezetimibe  10 mg Oral Daily  . ferric citrate  420 mg Oral BID WC  . gabapentin  600 mg Oral QHS  . heparin  5,000 Units Subcutaneous Q8H  . insulin aspart  0-15 Units Subcutaneous TID WC  . insulin aspart  0-5 Units Subcutaneous QHS  . methimazole  2.5 mg Oral Daily  . multivitamin  1 tablet Oral QHS  . pantoprazole  40 mg Oral QPM  . saccharomyces boulardii  250 mg Oral BID    Continuous Infusions: . cefTRIAXone (ROCEPHIN)  IV Stopped (07/19/18 1639)     LOS: 4 days     Kayleen Memos, MD [facility] Pager [phone]  If 7PM-7AM, please contact night-coverage [email or web address] Password [email or web address] 07/20/2018, 12:07 PM

## 2018-07-20 NOTE — Evaluation (Signed)
Physical Therapy Evaluation Patient Details Name: [name] MRN: [id number] DOB: 14-Oct-1950 Today's Date: 07/20/2018   History of Present Illness  67 y.o. male with medical history significant for DM2 with neuropathy, HTN, ESRD on HD via RUE AVF, ischemic cardiomyopathy, chronic combined systolic and diastolic CHF, OSA on CPAP, CAD s/p NSTEMI, hyperthyroidism, HLD, gout, depression who presents with sepsis 2/2 klebsiella pneumonia bacteremia.  Clinical Impression  Orders received for PT evaluation. Patient demonstrates deficits in functional mobility as indicated below. Will benefit from continued skilled PT to address deficits and maximize function, however, At this time, patient does not wish to continue acute PT services, requesting to just resume his home health PT services upon discharge. Encouraged continued mobility with staff during hospital course.    Follow Up Recommendations Home health PT;Supervision - Intermittent    Equipment Recommendations  None recommended by PT    Recommendations for Other Services       Precautions / Restrictions Precautions Precautions: Fall      Mobility  Bed Mobility Overal bed mobility: Modified Independent                Transfers Overall transfer level: Modified independent Equipment used: None Transfers: Sit to/from Stand Sit to Stand: Modified independent (Device/Increase time)            Ambulation/Gait Ambulation/Gait assistance: Supervision Gait Distance (Feet): 30 Feet Assistive device: None Gait Pattern/deviations: Wide base of support     General Gait Details: patient only agreeable to inroom ambulation secondary to weakness and fatigue  Stairs            Wheelchair Mobility    Modified Rankin (Stroke Patients Only)       Balance Overall balance assessment: Mild deficits observed, not formally tested                                           Pertinent Vitals/Pain Pain  Assessment: Faces Faces Pain Scale: Hurts little more Pain Location: back Pain Intervention(s): Monitored during session    Home Living Family/patient expects to be discharged to:: Private residence Living Arrangements: Spouse/significant other Available Help at Discharge: Family;Available 24 hours/day Type of Home: House Home Access: Stairs to enter Entrance Stairs-Rails: Right Entrance Stairs-Number of Steps: 3 Home Layout: One level Home Equipment: Walker - 2 wheels;Cane - single point;Shower seat;Bedside commode;Walker - 4 wheels      Prior Function Level of Independence: Independent with assistive device(s)         Comments: Used RW for ambulation. Reports he does not do alot on his dialysis days and mostly sits in recliner.      Hand Dominance   Dominant Hand: Left    Extremity/Trunk Assessment   Upper Extremity Assessment Upper Extremity Assessment: Generalized weakness    Lower Extremity Assessment Lower Extremity Assessment: Generalized weakness       Communication   Communication: No difficulties  Cognition Arousal/Alertness: Awake/alert Behavior During Therapy: WFL for tasks assessed/performed Overall Cognitive Status: Within Functional Limits for tasks assessed                                        General Comments      Exercises     Assessment/Plan    PT Assessment All further PT needs can be  met in the next venue of care  PT Problem List Decreased strength;Decreased activity tolerance;Decreased balance;Decreased mobility       PT Treatment Interventions      PT Goals (Current goals can be found in the Care Plan section)  Acute Rehab PT Goals Patient Stated Goal: to figure out what is wrong PT Goal Formulation: All assessment and education complete, DC therapy    Frequency     Barriers to discharge        Co-evaluation               AM-PAC PT "6 Clicks" Daily Activity  Outcome Measure Difficulty turning  over in bed (including adjusting bedclothes, sheets and blankets)?: A Little Difficulty moving from lying on back to sitting on the side of the bed? : A Little Difficulty sitting down on and standing up from a chair with arms (e.g., wheelchair, bedside commode, etc,.)?: A Little Help needed moving to and from a bed to chair (including a wheelchair)?: A Little Help needed walking in hospital room?: A Little Help needed climbing 3-5 steps with a railing? : A Little 6 Click Score: 18    End of Session   Activity Tolerance: Patient limited by fatigue Patient left: in bed;with call bell/phone within reach Nurse Communication: Mobility status PT Visit Diagnosis: Difficulty in walking, not elsewhere classified (R26.2);Muscle weakness (generalized) (M62.81)    Time: 4835-5997 PT Time Calculation (min) (ACUTE ONLY): 17 min   Charges:   PT Evaluation $PT Eval Moderate Complexity: 1 Mod          Alben Deeds, PT DPT  Board Certified Neurologic Specialist Acute Rehabilitation Services Pager [phone] Office [phone]   Duncan Dull 07/20/2018, 2:02 PM

## 2018-07-20 NOTE — Progress Notes (Signed)
[facility] Progress Note  Subjective: feeling better again today, back aches have resolved.  No new c/o.   Vitals:   07/20/18 0400 07/20/18 0500 07/20/18 0600 07/20/18 0700  BP:      Pulse:      Resp: 14 14 15  (!) 21  Temp:      TempSrc:      SpO2:      Weight:      Height:        Inpatient medications: . allopurinol  100 mg Oral Daily  . aspirin EC  81 mg Oral Daily  . Chlorhexidine Gluconate Cloth  6 each Topical Q0600  . cinacalcet  90 mg Oral BID WC  . citalopram  10 mg Oral QHS  . colesevelam  1,875 mg Oral BID WC  . doxercalciferol  4 mcg Intravenous Q T,Th,Sa-HD  . ezetimibe  10 mg Oral Daily  . ferric citrate  420 mg Oral BID WC  . gabapentin  600 mg Oral QHS  . heparin  5,000 Units Subcutaneous Q8H  . insulin aspart  0-15 Units Subcutaneous TID WC  . insulin aspart  0-5 Units Subcutaneous QHS  . methimazole  2.5 mg Oral Daily  . multivitamin  1 tablet Oral QHS  . pantoprazole  40 mg Oral QPM  . saccharomyces boulardii  250 mg Oral BID   . cefTRIAXone (ROCEPHIN)  IV Stopped (07/19/18 1639)   acetaminophen, alteplase, ferric citrate, heparin, hydroxypropyl methylcellulose / hypromellose, lidocaine (PF), lidocaine-prilocaine, polyethylene glycol, zolpidem  Iron/TIBC/Ferritin/ %Sat    Component Value Date/Time   IRON 16 (L) 06/12/2018 0500   TIBC 168 (L) 06/12/2018 0500   FERRITIN 709 (H) 10/17/2016 1103   IRONPCTSAT 10 (L) 06/12/2018 0500    Exam: Gen alert  No rash, cyanosis or gangrene Sclera anicteric, throat clear  No jvd or bruits Chest clear bilat no rales or wheeznig RRR no MRG Abd soft ntnd no mass or ascites +bs Ext no LE edema no lesions or ulcers Neuro is alert, Ox 3 , nf R upper arm AVF +bruit    Home meds:  - allopurinol 100/ aspirin 81/  pantoprazole 40/ ferric citrate tid ac/ methimazole 2.5 qd  - ezetimibe 10 qd/ alirocumab 150 mg q 2wks/ welchol tid 325  - citalopram 10 hs/ gabapentin 600 hs  - midodrine 10 mg  pre HD TTS  - florastor bid/ MVI/ prns/ vitamins  Dialysis: TTS   4h  350/800  100kg  2/2.25 bath  P2  Hep none   RUA AVF  - hect 4  - mircera 200 last 10/10     Impression/ Plan: 1. Sepsis 2/2 klebsiella bacteremia - on IV abx per primary. RUA AVF no gross signs of infection. Pt looks much better.  Multiple imaging studies of abdomen and spine didn't show any source of infection.  2. ESRD HD TTS. Missed yest HD due to staffing, plan HD today.  3. Hypotension - chronic , takes midodrine pre HD 4. Anemia ckd - Hb >10 , observe 5. CAD hx stenting/ CM EF 40-45% 6. Volume - stable, UF 1-2 kg today     Kelly Splinter MD [facility] pager [phone]   07/20/2018, 12:01 PM   Recent Labs  Lab 07/16/18 1453  07/16/18 2235 07/17/18 0614 07/18/18 2214 07/20/18 0508  NA 140   < >  --  139 140 139  K 5.2*   < > 5.4* 5.1 4.1 4.7  CL 98   < >  --  101 98 101  CO2 27  --   --  26 27 23   GLUCOSE 196*   < >  --  125* 111* 115*  BUN 41*   < >  --  50* 30* 38*  CREATININE 10.02*   < >  --  11.22* 8.36* 10.79*  CALCIUM 8.8*  --   --  8.3* 9.0 8.7*  PHOS  --   --   --   --  5.1*  --   ALBUMIN 3.3*  --   --  2.9* 2.7*  --   INR 1.09  --  1.12  --   --   --    < > = values in this interval not displayed.   Recent Labs  Lab 07/16/18 1453 07/17/18 0614  AST 193* 83*  ALT 231* 158*  ALKPHOS 230* 197*  BILITOT 2.8* 2.4*  PROT 7.6 6.8   Recent Labs  Lab 07/16/18 1453  07/18/18 0915 07/18/18 2214 07/20/18 0508  WBC 9.8   < > 5.3 5.8 5.8  NEUTROABS 8.0*  --  3.2  --   --   HGB 12.2*   < > 11.2* 10.9* 10.4*  HCT 41.1   < > 36.9* 36.1* 35.1*  MCV 102.0*   < > 99.2 99.2 98.9  PLT 288   < > 273 249 273   < > = values in this interval not displayed.

## 2018-07-20 NOTE — Progress Notes (Signed)
Places himself on and off home CPAP with home settings. Advised to call if needed anything.

## 2018-07-20 NOTE — Progress Notes (Signed)
Pt returned from dialysis at [facility]. Currently eating dinner. Will wait until 2200 to do CBG check due to late meal.

## 2018-07-20 NOTE — Progress Notes (Signed)
Patient declined assistance with home CPAP at this time. NO distress noted.

## 2018-07-20 NOTE — Progress Notes (Signed)
Hemodialysis Treatment Canceled for Today  Hemodialysis canceled for today by Dr. Jonnie Finner Rescheduled for : 07/20/18 Patient and patient's nurse notified of schedule change at 2258 spoke w/ Collie Siad, RN Orders written for today will be used for next scheduled hemodialysis treatment unless a new order is written.

## 2018-07-21 ENCOUNTER — Other Ambulatory Visit: Payer: Self-pay

## 2018-07-21 LAB — CULTURE, BLOOD (ROUTINE X 2)
Culture: NO GROWTH
Special Requests: ADEQUATE

## 2018-07-21 LAB — GLUCOSE, CAPILLARY
Glucose-Capillary: 90 mg/dL (ref 70–99)
Glucose-Capillary: 111 mg/dL — ABNORMAL HIGH (ref 70–99)
Glucose-Capillary: 105 mg/dL — ABNORMAL HIGH (ref 70–99)
Glucose-Capillary: 122 mg/dL — ABNORMAL HIGH (ref 70–99)

## 2018-07-21 MED ORDER — SODIUM CHLORIDE 0.9 % IV SOLN
2.0000 g | INTRAVENOUS | Status: DC
  Administered 2018-07-21 – 2018-07-22 (×2): 2 g via INTRAVENOUS
  Filled 2018-07-21 (×3): qty 20

## 2018-07-21 NOTE — Consult Note (Signed)
   [facility] CM Inpatient Consult   07/21/2018  [name] 1951-07-22 [id number]    Patient screened for potential [facility] Care Management services due to unplanned readmission risk score of 27% (high).   Went to bedside to speak with [name] and wife [name]. Discussed [facility] Care Management program services. [name] and wife are agreeable. [facility] Care Management written consent obtained and [facility] folder provided.  Confirmed Primary Care MD is Dr. [name] (Velora Heckler at [place] is listed as doing transition of care calls).   Best contact for post discharge calls is [name] (wife) at [phone]. [name] is agreeable to his wife being primary contact.  No concerns voiced regarding transportation. However, [name] states he has issues with affording medications.  [name] states they are familiar with [facility] as [name] was active with [facility] in the past.  Agreeable for [facility] follow up for complex case management and CHF, DM disease management. [name] has medical history of CHF, ESRD, HD (Tuesday, Thursdays, Saturdays) OSA, CAD, NSTEMI.  Made (covering) inpatient RNCM aware [facility] will follow post discharge.   [name] Rolling, MSN-Ed, RN,BSN [facility] Liaison [phone]

## 2018-07-21 NOTE — Progress Notes (Signed)
Progress Notes         PROGRESS NOTE  [name]:[id number] DOB: 03-24-1951 DOA: 07/16/2018 PCP: Biagio Borg, MD  HPI/Recap of past 24 hours: [name] is a 67 y.o. male with medical history significant for DM2 with neuropathy, HTN, ESRD on HD via RUE AVF (last session yesterday at [facility]), ischemic cardiomyopathy, chronic combined systolic and diastolic CHF, OSA on CPAP, CAD s/p NSTEMI, hyperthyroidism, HLD, gout, depression who was brought to the ED today after he became lethargic and not as responsive when on his way to get a TTE. States that last night he went to bed early because he was weak, aching "all over," especially in his back and bilateral thighs. He did not take his temp and is unsure if he had a fever at that time. This morning he still felt bad, but made it to his cardiology appt where he started feeling even more weak and lethargic. His wife said he was not responding to her for a few seconds/minutes. He was slumped in his chair. He was brought to the ED. He denies pain currently. States his pain had resolved by the time he got to the ED.  He was admitted to the hospital here about 4 weeks ago when he presented with fevers/chills and dyspnea and found to have PNA. He was treated with 5 days of IV Levaquin. Blood cultures were negative at that time. He does not make urine.   He was awake and alert upon arrival to ED. Rectal temp was 101.4. He was tachycardic at 107, BP 111/84 which trended to 85/56 and then back to 102/61. RR was 17-24. SaO2 was 91-99% on RA. His lactate was elevated at ~2.5. K was 5.2 -> 6.3. CXR showed resolved R lung base opacity and no acute CP disease. CTAP noncontrast revealed nothing to suggest infection; showed bilateral nonobstructing kidney stones, prior posterior lumbar fusion at L4-5 with well incorporated interbody block and interbody disc placed at L3-4.    07/17/2017: Patient seen and examined at his bedside.  States he feels better.   His lower back pain is improving.  Denies any nausea or abdominal pain.  Scheduled for hemodialysis today.  1 of 2 bottles of blood culture grew gram-positive rods.  Possible contaminant.  07/18/18: seen and examined at bedside. No acute events overnight. 1/2 bottle positive for klebsiella.  07/19/2018: Patient seen and examined at his bedside uses CPAP overnight.  States his overall body aches is improving.  Admits to intermittent nonproductive cough.  07/20/2018: Patient seen and examined at his bedside.  No acute events overnight.  States his lower back pain is improving.  Missed hemodialysis yesterday due to staffing.  Hemoglobin plan today per nephrology.  07/21/18: Complaints of mild b/l flank pain. Has non obstructive nephrolithiasis. No nausea. No chest pain or dyspnea. Minimal cough.    Assessment/Plan: Principal Problem:   Sepsis ([place]) Active Problems:   Gout   Depression   Hypertensive heart disease with CHF (congestive heart failure) ([name])   Gastro-esophageal reflux disease without esophagitis   Hyperparathyroidism, secondary ([place])   CAD S/P percutaneous coronary angioplasty   OSA (obstructive sleep apnea)   HLD (hyperlipidemia)   Hyperthyroidism   Neuropathy due to secondary diabetes mellitus ([place])   DM (diabetes mellitus) type II controlled with renal manifestation ([place])   ESRD on hemodialysis ([place])   Essential hypertension  Sepsis 2/2 to klebsiella pneumonia bacteremia, unclear etiology  procalcitonin 16 on 07/18/2018 to 8 on 07/20/18 Planning  to complete 2 weeks total antibiotics Has completed 4 days IV rocephin Will most likely transition to keflex tomorrow Repeat procal tomorrow Repeat blood cx negative  ESRD on hemodialysis Tuesday Thursday Saturday Continue to monitor electrolytes Hemodialysis planned today 07/20/2018 Missed scheduled hemodialysis yesterday due to staffing Renal dialysis diet Nephrology following  Non obstructive nephrolithiasis Does  not make urine States pain is mild Treat symptomatically  Anemia of chronic disease the setting of ESRD Hemoglobin stable No sign of overt bleeding  Non-insulin-dependent type 2 diabetes A1c 5.7 on 07/16/2018 Avoid hypoglycemia Insulin sliding scale as needed for hyperglycemia  Resolving hyperkalemia suspect secondary to advanced renal failure Nephrology following Hemodialysis plan today 07/20/2018  Abnormal LFTs post cholecystectomy Currently asymptomatic Repeat chemistry panel in the morning  Combined diastolic and systolic chronic CHF/ Last 2D echo done on 10/17/2016 revealed LVEF 40 to 45%, basal inferior wall hypokinesis with grade 2 diastolic dysfunction No acute issues at this time Continue cardiac medications  Coronary artery disease/ischemic cardiomyopathy status post stenting Continue cardiac medications Continue antiplatelet and antilipid  Hyperthyroidism Continue methimazole  OSA Continue CPAP at night      Code Status: Full code  Family Communication: None at bedside  Disposition Plan: Home possibly tomorrow 07/22/18 or when nephrology signs off.   Consultants:  Nephrology  Procedures:  None  Antimicrobials:  IV Rocephin  DVT prophylaxis:  hep sq 5000 u tid   Objective: Vitals:   07/20/18 2334 07/21/18 0335 07/21/18 0747 07/21/18 1123  BP: 119/64 121/71 (!) 145/83 (!) 148/82  Pulse: 75 74 81 76  Resp: 17 18 16 16   Temp: 98.6 F (37 C) 98.6 F (37 C) 97.8 F (36.6 C) 98.4 F (36.9 C)  TempSrc: Oral Oral Oral Oral  SpO2: 98% 100% 93% 96%  Weight:      Height:        Intake/Output Summary (Last 24 hours) at 07/21/2018 1347 Last data filed at 07/20/2018 1854 Gross per 24 hour  Intake -  Output 2500 ml  Net -2500 ml   Filed Weights   07/17/18 1800 07/20/18 1434 07/20/18 1854  Weight: 100.3 kg 103.1 kg 99.8 kg    Exam:  . General: 67 y.o. year-old male WD WN NAD A&O x 3  . Cardiovascular: RRR no rubs or gallops. No  JVD or thyromegaly. [name] Respiratory: Lungs are clear to auscultation with no wheezes or rales. Good inspiratory effort. . Abdomen: Soft nontender nondistended with normal bowel sounds x4 quadrants. . Musculoskeletal: Trace lower extremity edema. 2/4 pulses in all 4 extremities.  Right upper extremity AV fistula. . Skin: No ulcerative lesions noted or rashes. Scar mid low back from previous surgery. [name] Psychiatry: Mood is appropriate for condition and setting   Data Reviewed: CBC: Recent Labs  Lab 07/16/18 1453 07/16/18 1501 07/17/18 0614 07/18/18 0915 07/18/18 2214 07/20/18 0508  WBC 9.8  --  7.7 5.3 5.8 5.8  NEUTROABS 8.0*  --   --  3.2  --   --   HGB 12.2* 13.9  13.9 11.1* 11.2* 10.9* 10.4*  HCT 41.1 41.0  41.0 36.4* 36.9* 36.1* 35.1*  MCV 102.0*  --  100.0 99.2 99.2 98.9  PLT 288  --  261 273 249 710   Basic Metabolic Panel: Recent Labs  Lab 07/16/18 1453 07/16/18 1501 07/16/18 2235 07/17/18 0614 07/18/18 2214 07/20/18 0508  NA 140 137  137  --  139 140 139  K 5.2* 6.3*  6.3* 5.4* 5.1 4.1 4.7  CL 98  102  102  --  101 98 101  CO2 27  --   --  26 27 23   GLUCOSE 196* 194*  194*  --  125* 111* 115*  BUN 41* 61*  61*  --  50* 30* 38*  CREATININE 10.02* 10.70*  10.70*  --  11.22* 8.36* 10.79*  CALCIUM 8.8*  --   --  8.3* 9.0 8.7*  PHOS  --   --   --   --  5.1*  --    GFR: Estimated Creatinine Clearance: 8 mL/min (A) (by C-G formula based on SCr of 10.79 mg/dL (H)). Liver Function Tests: Recent Labs  Lab 07/16/18 1453 07/17/18 0614 07/18/18 2214  AST 193* 83*  --   ALT 231* 158*  --   ALKPHOS 230* 197*  --   BILITOT 2.8* 2.4*  --   PROT 7.6 6.8  --   ALBUMIN 3.3* 2.9* 2.7*   No results for input(s): LIPASE, AMYLASE in the last 168 hours. No results for input(s): AMMONIA in the last 168 hours. Coagulation Profile: Recent Labs  Lab 07/16/18 1453 07/16/18 2235  INR 1.09 1.12   Cardiac Enzymes: Recent Labs  Lab 07/16/18 2235  CKTOTAL 131   BNP  (last 3 results) No results for input(s): PROBNP in the last 8760 hours. HbA1C: No results for input(s): HGBA1C in the last 72 hours. CBG: Recent Labs  Lab 07/19/18 2102 07/20/18 0623 07/20/18 2143 07/21/18 0608 07/21/18 1121  GLUCAP 123* 114* 135* 90 111*   Lipid Profile: No results for input(s): CHOL, HDL, LDLCALC, TRIG, CHOLHDL, LDLDIRECT in the last 72 hours. Thyroid Function Tests: No results for input(s): TSH, T4TOTAL, FREET4, T3FREE, THYROIDAB in the last 72 hours. Anemia Panel: No results for input(s): VITAMINB12, FOLATE, FERRITIN, TIBC, IRON, RETICCTPCT in the last 72 hours. Urine analysis:    Component Value Date/Time   COLORURINE LT. YELLOW 06/26/2013 1200   APPEARANCEUR CLEAR 06/26/2013 1200   LABSPEC 1.020 06/26/2013 1200   PHURINE 8.5 06/26/2013 1200   GLUCOSEU 100 06/26/2013 1200   HGBUR MODERATE 06/26/2013 1200   BILIRUBINUR NEGATIVE 06/26/2013 1200   KETONESUR NEGATIVE 06/26/2013 1200   PROTEINUR >300 (A) 01/04/2010 1032   UROBILINOGEN 0.2 06/26/2013 1200   NITRITE NEGATIVE 06/26/2013 1200   LEUKOCYTESUR TRACE 06/26/2013 1200   Sepsis Labs: @LABRCNTIP (procalcitonin:4,lacticidven:4)  ) Recent Results (from the past 240 hour(s))  Blood Culture (routine x 2)     Status: Abnormal   Collection Time: 07/16/18  2:54 PM  Result Value Ref Range Status   Specimen Description BLOOD LEFT ANTECUBITAL  Final   Special Requests   Final    BOTTLES DRAWN AEROBIC AND ANAEROBIC Blood Culture results may not be optimal due to an inadequate volume of blood received in culture bottles   Culture  Setup Time   Final    GRAM NEGATIVE RODS ANAEROBIC BOTTLE ONLY CRITICAL RESULT CALLED TO, READ BACK BY AND VERIFIED WITH: E. MARTIN PHARMD, AT 1058 07/17/18 BY D. VANHOOK AEROBIC BOTTLE ONLY GRAM POSITIVE COCCI Organism ID to follow CRITICAL RESULT CALLED TO, READ BACK BY AND VERIFIED WITH: V BRYK PHARMD 07/17/18 2359 JDW CORRECTED RESULTS PREVIOUSLY REPORTED AS: GRAM  POSITIVE RODS CORRECTED RESULTS CALLED TO: PHARMD R RUMBERG 07/18/18 AT 1315 BY CM    Culture (A)  Final    KLEBSIELLA PNEUMONIAE STAPHYLOCOCCUS SPECIES (COAGULASE NEGATIVE) THE SIGNIFICANCE OF ISOLATING THIS ORGANISM FROM A SINGLE SET OF BLOOD CULTURES WHEN MULTIPLE SETS ARE DRAWN IS UNCERTAIN. PLEASE NOTIFY THE MICROBIOLOGY  DEPARTMENT WITHIN ONE WEEK IF SPECIATION AND SENSITIVITIES ARE REQUIRED. Performed at [facility], [place] [address], [place], [place] [address]    Report Status 07/19/2018 FINAL  Final   Organism ID, Bacteria KLEBSIELLA PNEUMONIAE  Final      Susceptibility   Klebsiella pneumoniae - MIC*    AMPICILLIN >=32 RESISTANT Resistant     CEFAZOLIN <=4 SENSITIVE Sensitive     CEFEPIME <=1 SENSITIVE Sensitive     CEFTAZIDIME <=1 SENSITIVE Sensitive     CEFTRIAXONE <=1 SENSITIVE Sensitive     CIPROFLOXACIN <=0.25 SENSITIVE Sensitive     GENTAMICIN <=1 SENSITIVE Sensitive     IMIPENEM <=0.25 SENSITIVE Sensitive     TRIMETH/SULFA <=20 SENSITIVE Sensitive     AMPICILLIN/SULBACTAM 4 SENSITIVE Sensitive     PIP/TAZO <=4 SENSITIVE Sensitive     Extended ESBL NEGATIVE Sensitive     * KLEBSIELLA PNEUMONIAE  Blood Culture ID Panel (Reflexed)     Status: Abnormal   Collection Time: 07/16/18  2:54 PM  Result Value Ref Range Status   Enterococcus species NOT DETECTED NOT DETECTED Final   Listeria monocytogenes NOT DETECTED NOT DETECTED Final   Staphylococcus species DETECTED (A) NOT DETECTED Final    Comment: Methicillin (oxacillin) susceptible coagulase negative staphylococcus. Possible blood culture contaminant (unless isolated from more than one blood culture draw or clinical case suggests pathogenicity). No antibiotic treatment is indicated for blood  culture contaminants. CRITICAL RESULT CALLED TO, READ BACK BY AND VERIFIED WITH: V BRYK PHARMD 07/17/18 2359 JDW    Staphylococcus aureus (BCID) NOT DETECTED NOT DETECTED Final   Methicillin resistance NOT DETECTED NOT  DETECTED Final   Streptococcus species NOT DETECTED NOT DETECTED Final   Streptococcus agalactiae NOT DETECTED NOT DETECTED Final   Streptococcus pneumoniae NOT DETECTED NOT DETECTED Final   Streptococcus pyogenes NOT DETECTED NOT DETECTED Final   Acinetobacter baumannii NOT DETECTED NOT DETECTED Final   Enterobacteriaceae species NOT DETECTED NOT DETECTED Final   Enterobacter cloacae complex NOT DETECTED NOT DETECTED Final   Escherichia coli NOT DETECTED NOT DETECTED Final   Klebsiella oxytoca NOT DETECTED NOT DETECTED Final   Klebsiella pneumoniae NOT DETECTED NOT DETECTED Final   Proteus species NOT DETECTED NOT DETECTED Final   Serratia marcescens NOT DETECTED NOT DETECTED Final   Haemophilus influenzae NOT DETECTED NOT DETECTED Final   Neisseria meningitidis NOT DETECTED NOT DETECTED Final   Pseudomonas aeruginosa NOT DETECTED NOT DETECTED Final   Candida albicans NOT DETECTED NOT DETECTED Final   Candida glabrata NOT DETECTED NOT DETECTED Final   Candida krusei NOT DETECTED NOT DETECTED Final   Candida parapsilosis NOT DETECTED NOT DETECTED Final   Candida tropicalis NOT DETECTED NOT DETECTED Final    Comment: Performed at [facility], [place] [address], [place], [place] [address] [place] [address]  Blood Culture (routine x 2)     Status: None   Collection Time: 07/16/18  3:24 PM  Result Value Ref Range Status   Specimen Description BLOOD LEFT ARM  Final   Special Requests   Final    BOTTLES DRAWN AEROBIC AND ANAEROBIC Blood Culture adequate volume   Culture   Final    NO GROWTH 5 DAYS Performed at [facility], [address], [place], [place] [address]    Report Status 07/21/2018 FINAL  Final  Culture, blood (routine x 2)     Status: None (Preliminary result)   Collection Time: 07/18/18 11:39 AM  Result Value Ref Range Status   Specimen  Description BLOOD SITE NOT SPECIFIED  Final   Special Requests   Final    BOTTLES DRAWN AEROBIC AND ANAEROBIC Blood Culture adequate volume    Culture   Final    NO GROWTH 3 DAYS Performed at [facility], [address], [place], [place] [address]    Report Status PENDING  Incomplete  Culture, blood (routine x 2)     Status: None (Preliminary result)   Collection Time: 07/18/18 11:41 AM  Result Value Ref Range Status   Specimen Description BLOOD LEFT ANTECUBITAL  Final   Special Requests   Final    BOTTLES DRAWN AEROBIC AND ANAEROBIC Blood Culture adequate volume   Culture   Final    NO GROWTH 3 DAYS Performed at [facility], [place] [address] [place], [place] [address]    Report Status PENDING  Incomplete  MRSA PCR Screening     Status: None   Collection Time: 07/18/18 12:16 PM  Result Value Ref Range Status   MRSA by PCR NEGATIVE NEGATIVE Final    Comment:        The GeneXpert MRSA Assay (FDA approved for NASAL specimens only), is one component of a comprehensive MRSA colonization surveillance program. It is not intended to diagnose MRSA infection nor to guide or monitor treatment for MRSA infections. Performed at [facility], [place] [address] [place],  [address]       Studies: No results found.  Scheduled Meds: . allopurinol  100 mg Oral Daily  . aspirin EC  81 mg Oral Daily  . Chlorhexidine Gluconate Cloth  6 each Topical Q0600  . cinacalcet  90 mg Oral BID WC  . citalopram  10 mg Oral QHS  . colesevelam  1,875 mg Oral BID WC  . doxercalciferol  4 mcg Intravenous Q T,Th,Sa-HD  . ezetimibe  10 mg Oral Daily  . ferric citrate  420 mg Oral BID WC  . gabapentin  600 mg Oral QHS  . heparin  5,000 Units Subcutaneous Q8H  . insulin aspart  0-15 Units Subcutaneous TID WC  . insulin aspart  0-5 Units Subcutaneous QHS  . methimazole  2.5 mg Oral Daily  . multivitamin  1 tablet Oral QHS  . pantoprazole  40 mg Oral QPM  . saccharomyces boulardii  250 mg Oral BID    Continuous Infusions: . cefTRIAXone (ROCEPHIN)  IV       LOS: 5 days     Kayleen Memos, MD [facility] Pager [phone]  If 7PM-7AM, please contact night-coverage [email or web address] Password [email or web address] 07/21/2018, 1:47 PM

## 2018-07-21 NOTE — Progress Notes (Signed)
Pt has home cpap and places self on/off.  RT will monitor as necessary.

## 2018-07-21 NOTE — Progress Notes (Signed)
[name] Progress Note    Subjective:   Complaining of back pain, right flank/low back.  Reports that he hurt "all over" when he was first admitted but now mainly in the right flank area.  He does not make urine.   Objective:   BP (!) 145/83 (BP Location: Left Arm)   Pulse 81   Temp 97.8 F (36.6 C) (Oral)   Resp 16   Ht 6\' 1"  (1.854 m)   Wt 99.8 kg Comment: stood to scale   SpO2 93%   BMI 29.03 kg/m   Intake/Output: I/O last 3 completed shifts: In: -  Out: 2500 [Other:2500]   Intake/Output this shift:  No intake/output data recorded. Weight change:   Physical Exam: Gen:NAD CVS: no rub Resp: cta Abd: +BS, soft, NT/ND Ext: RUE AVF +T/B, no drainage or erythema  Labs: BMET Recent Labs  Lab 07/16/18 1453 07/16/18 1501 07/16/18 2235 07/17/18 0614 07/18/18 2214 07/20/18 0508  NA 140 137  137  --  139 140 139  K 5.2* 6.3*  6.3* 5.4* 5.1 4.1 4.7  CL 98 102  102  --  101 98 101  CO2 27  --   --  26 27 23   GLUCOSE 196* 194*  194*  --  125* 111* 115*  BUN 41* 61*  61*  --  50* 30* 38*  CREATININE 10.02* 10.70*  10.70*  --  11.22* 8.36* 10.79*  ALBUMIN 3.3*  --   --  2.9* 2.7*  --   CALCIUM 8.8*  --   --  8.3* 9.0 8.7*  PHOS  --   --   --   --  5.1*  --    CBC Recent Labs  Lab 07/16/18 1453  07/17/18 0614 07/18/18 0915 07/18/18 2214 07/20/18 0508  WBC 9.8  --  7.7 5.3 5.8 5.8  NEUTROABS 8.0*  --   --  3.2  --   --   HGB 12.2*   < > 11.1* 11.2* 10.9* 10.4*  HCT 41.1   < > 36.4* 36.9* 36.1* 35.1*  MCV 102.0*  --  100.0 99.2 99.2 98.9  PLT 288  --  261 273 249 273   < > = values in this interval not displayed.    @IMGRELPRIORS @ Medications:    . allopurinol  100 mg Oral Daily  . aspirin EC  81 mg Oral Daily  . Chlorhexidine Gluconate Cloth  6 each Topical Q0600  . cinacalcet  90 mg Oral BID WC  . citalopram  10 mg Oral QHS  . colesevelam  1,875 mg Oral BID WC  . doxercalciferol  4 mcg Intravenous Q T,Th,Sa-HD  . ezetimibe   10 mg Oral Daily  . ferric citrate  420 mg Oral BID WC  . gabapentin  600 mg Oral QHS  . heparin  5,000 Units Subcutaneous Q8H  . insulin aspart  0-15 Units Subcutaneous TID WC  . insulin aspart  0-5 Units Subcutaneous QHS  . methimazole  2.5 mg Oral Daily  . multivitamin  1 tablet Oral QHS  . pantoprazole  40 mg Oral QPM  . saccharomyces boulardii  250 mg Oral BID   Dialysis:TTS  4h 350/800 100kg 2/2.25 bath P2 Hep none   RUA AVF - hect 4 - mircera 200 last 10/10   Assessment/ Plan:   1. Klebsiella pneu bacteremia- unclear source but was admitted 3 weeks ago with CAP.  On ceftriaxone.   2. ESRD- cont with HD qTTS (was off  schedule and upset that he had to wait until yesterday evening to get HD) 3. Anemia: cont with micera and follow 4. CKD-MBD: cont with binders/sensipar/vit D 5. Nutrition: renal 6. Hypertension: stable 7. Back pain- has diffuse thoracic and Lumbar spine disease.  Also with bilateral nephrolithiasis but non-obstructing.  Does not make urine.  Cont to follow.  8. CHF- biventricular 9. Fatty liver disease- with abnormal LFT's 10. OSA on CPAP  Donetta Potts, MD [place] Pager [phone] 07/21/2018, 10:39 AM

## 2018-07-22 DIAGNOSIS — R740 Nonspecific elevation of levels of transaminase and lactic acid dehydrogenase [LDH]: Secondary | ICD-10-CM

## 2018-07-22 LAB — GLUCOSE, CAPILLARY
Glucose-Capillary: 141 mg/dL — ABNORMAL HIGH (ref 70–99)
Glucose-Capillary: 98 mg/dL (ref 70–99)
Glucose-Capillary: 100 mg/dL — ABNORMAL HIGH (ref 70–99)
Glucose-Capillary: 113 mg/dL — ABNORMAL HIGH (ref 70–99)
Glucose-Capillary: 144 mg/dL — ABNORMAL HIGH (ref 70–99)

## 2018-07-22 LAB — RENAL FUNCTION PANEL
ALBUMIN: 2.8 g/dL — AB (ref 3.5–5.0)
Anion gap: 15 (ref 5–15)
BUN: 37 mg/dL — ABNORMAL HIGH (ref 8–23)
CO2: 22 mmol/L (ref 22–32)
CREATININE: 10.37 mg/dL — AB (ref 0.61–1.24)
Calcium: 8.7 mg/dL — ABNORMAL LOW (ref 8.9–10.3)
Chloride: 99 mmol/L (ref 98–111)
GFR calc Af Amer: 5 mL/min — ABNORMAL LOW (ref >60)
GFR calc non Af Amer: 4 mL/min — ABNORMAL LOW (ref >60)
GLUCOSE: 126 mg/dL — AB (ref 70–99)
PHOSPHORUS: 6.7 mg/dL — AB (ref 2.5–4.6)
POTASSIUM: 4 mmol/L (ref 3.5–5.1)
Sodium: 136 mmol/L (ref 135–145)

## 2018-07-22 LAB — CBC
HEMATOCRIT: 35.7 % — AB (ref 39.0–52.0)
HEMOGLOBIN: 11 g/dL — AB (ref 13.0–17.0)
MCH: 30.2 pg (ref 26.0–34.0)
MCHC: 30.8 g/dL (ref 30.0–36.0)
MCV: 98.1 fL (ref 80.0–100.0)
Platelets: 276 10*3/uL (ref 150–400)
RBC: 3.64 MIL/uL — ABNORMAL LOW (ref 4.22–5.81)
RDW: 19.6 % — AB (ref 11.5–15.5)
WBC: 5.8 10*3/uL (ref 4.0–10.5)
nRBC: 0.3 % — ABNORMAL HIGH (ref 0.0–0.2)

## 2018-07-22 LAB — PROCALCITONIN: Procalcitonin: 4.54 ng/mL

## 2018-07-22 MED ORDER — CEPHALEXIN 500 MG PO CAPS: 500.0000 mg | ORAL_CAPSULE | Freq: Two times a day (BID) | ORAL | Status: DC

## 2018-07-22 MED ORDER — DOXERCALCIFEROL 4 MCG/2ML IV SOLN
INTRAVENOUS | Status: AC
  Administered 2018-07-22: 4 ug
  Filled 2018-07-22: qty 2

## 2018-07-22 MED ORDER — CEFDINIR 300 MG PO CAPS
300.0000 mg | ORAL_CAPSULE | Freq: Every day | ORAL | Status: DC
  Administered 2018-07-22: 300 mg via ORAL
  Filled 2018-07-22 (×3): qty 1

## 2018-07-22 NOTE — Progress Notes (Signed)
Pt has his home CPAP with him and places himself on at night. RT will continue to monitor pt as needed.

## 2018-07-22 NOTE — Progress Notes (Signed)
Progress Notes         PROGRESS NOTE  [name] NID:[id number] DOB: January 16, 1951 DOA: 07/16/2018 PCP: Biagio Borg, MD  HPI/Recap of past 24 hours: [name] is a 67 y.o. male with medical history significant for DM2 with polyneuropathy, HTN, ESRD on HD  Tuesday Thursday Saturday, ischemic cardiomyopathy, chronic combined systolic and diastolic CHF, OSA on CPAP, CAD s/p NSTEMI, hyperthyroidism, HLD, gout, chronic depression who was brought to the ED after he became lethargic and not as responsive when on his way to get a TTE. He was awake and alert upon arrival to ED. Rectal temp was 101.4.  CXR showed resolved R lung base opacity and no acute cardiopulmonary disease.   Hospital course complicated by Klebsiella bacteremia with unclear source and intermittent mild bilateral flank pain.  Nephrolithiasis noted on imaging.  07/22/2018: Patient seen and examined at his bedside.  No acute events overnight.  Dialysis planned today with 2 L of fluid to be removed.  We will switch IV antibiotics to oral and monitor overnight.   Possible discharge tomorrow 07/23/2018 if no events overnight.  Will switch IV antibiotics to p.o. antibiotics.  Will monitor overnight with possible discharge tomorrow 07/23/2018 to home with home health PT.    Assessment/Plan: Principal Problem:   Sepsis ([place]) Active Problems:   Gout   Depression   Hypertensive heart disease with CHF (congestive heart failure) ([name])   Gastro-esophageal reflux disease without esophagitis   Hyperparathyroidism, secondary ([place])   CAD S/P percutaneous coronary angioplasty   OSA (obstructive sleep apnea)   HLD (hyperlipidemia)   Hyperthyroidism   Neuropathy due to secondary diabetes mellitus ([place])   DM (diabetes mellitus) type II controlled with renal manifestation ([name])   ESRD on hemodialysis ([place])   Essential hypertension  Sepsis 2/2 to klebsiella pneumonia bacteremia, unclear etiology Recently treated for  pneumonia Procalcitonin continue to trend down from 16 to 8 to 4 on 07/22/2018 Completed 5 days of IV Rocephin Switch to Keflex on 07/22/2018 renally dosed x9 days    ESRD on hemodialysis Tuesday Thursday Saturday Continue to monitor electrolytes HD 07/22/2018 Renal dialysis diet Continue next dialysis outpatient  Non obstructive nephrolithiasis Does not make urine States pain is mild Treat symptomatically  Anemia of chronic disease the setting of ESRD Hemoglobin stable No sign of overt bleeding  Non-insulin-dependent type 2 diabetes A1c 5.7 on 07/16/2018 Avoid hypoglycemia Insulin sliding scale as needed for hyperglycemia  Resolving hyperkalemia suspect secondary to advanced renal failure Nephrology following  Abnormal LFTs post cholecystectomy Currently asymptomatic  Combined diastolic and systolic chronic CHF/ Last 2D echo done on 10/17/2016 revealed LVEF 40 to 45%, basal inferior wall hypokinesis with grade 2 diastolic dysfunction No acute issues at this time Continue cardiac medications  Coronary artery disease/ischemic cardiomyopathy status post stenting Continue cardiac medications Continue antiplatelet and antilipid  Hyperthyroidism Continue methimazole  OSA Continue CPAP at night      Code Status: Full code  Family Communication: None at bedside  Disposition Plan: Home possibly tomorrow 07/23/2018 with home health PT.   Consultants:  Nephrology  Procedures:  None  Antimicrobials:  Keflex  DVT prophylaxis:  hep sq 5000 u tid   Objective: Vitals:   07/22/18 1030 07/22/18 1100 07/22/18 1126 07/22/18 1220  BP: 127/69 116/78 126/72 111/61  Pulse: 76 85 75 89  Resp:   18 18  Temp:   97.8 F (36.6 C) 97.7 F (36.5 C)  TempSrc:   Oral Oral  SpO2:  96% 99%  Weight:   98.2 kg   Height:        Intake/Output Summary (Last 24 hours) at 07/22/2018 1407 Last data filed at 07/22/2018 1126 Gross per 24 hour  Intake -  Output 2000 ml   Net -2000 ml   Filed Weights   07/20/18 1854 07/22/18 0720 07/22/18 1126  Weight: 99.8 kg 100.5 kg 98.2 kg    Exam:  . General: 67 y.o. year-old male well-developed well-nourished in no acute distress.  Alert and oriented x3. . Cardiovascular: Regular rate and rhythm with no rubs or gallops.  No JVD or thyromegaly noted. [name] Respiratory: Lungs are clear to auscultation with no wheezes or rales. Good inspiratory effort. . Abdomen: Soft nontender nondistended with normal bowel sounds x4 quadrants. . Musculoskeletal: Trace lower extremity edema. 2/4 pulses in all 4 extremities.  Right upper extremity AV fistula. . Skin: No ulcerative lesions noted or rashes. Scar mid low back from previous surgery. [name] Psychiatry: Mood is appropriate for condition and setting   Data Reviewed: CBC: Recent Labs  Lab 07/16/18 1453  07/17/18 0614 07/18/18 0915 07/18/18 2214 07/20/18 0508 07/22/18 0720  WBC 9.8  --  7.7 5.3 5.8 5.8 5.8  NEUTROABS 8.0*  --   --  3.2  --   --   --   HGB 12.2*   < > 11.1* 11.2* 10.9* 10.4* 11.0*  HCT 41.1   < > 36.4* 36.9* 36.1* 35.1* 35.7*  MCV 102.0*  --  100.0 99.2 99.2 98.9 98.1  PLT 288  --  261 273 249 273 276   < > = values in this interval not displayed.   Basic Metabolic Panel: Recent Labs  Lab 07/16/18 1453 07/16/18 1501 07/16/18 2235 07/17/18 0614 07/18/18 2214 07/20/18 0508 07/22/18 0720  NA 140 137  137  --  139 140 139 136  K 5.2* 6.3*  6.3* 5.4* 5.1 4.1 4.7 4.0  CL 98 102  102  --  101 98 101 99  CO2 27  --   --  26 27 23 22   GLUCOSE 196* 194*  194*  --  125* 111* 115* 126*  BUN 41* 61*  61*  --  50* 30* 38* 37*  CREATININE 10.02* 10.70*  10.70*  --  11.22* 8.36* 10.79* 10.37*  CALCIUM 8.8*  --   --  8.3* 9.0 8.7* 8.7*  PHOS  --   --   --   --  5.1*  --  6.7*   GFR: Estimated Creatinine Clearance: 8.3 mL/min (A) (by C-G formula based on SCr of 10.37 mg/dL (H)). Liver Function Tests: Recent Labs  Lab 07/16/18 1453 07/17/18 0614  07/18/18 2214 07/22/18 0720  AST 193* 83*  --   --   ALT 231* 158*  --   --   ALKPHOS 230* 197*  --   --   BILITOT 2.8* 2.4*  --   --   PROT 7.6 6.8  --   --   ALBUMIN 3.3* 2.9* 2.7* 2.8*   No results for input(s): LIPASE, AMYLASE in the last 168 hours. No results for input(s): AMMONIA in the last 168 hours. Coagulation Profile: Recent Labs  Lab 07/16/18 1453 07/16/18 2235  INR 1.09 1.12   Cardiac Enzymes: Recent Labs  Lab 07/16/18 2235  CKTOTAL 131   BNP (last 3 results) No results for input(s): PROBNP in the last 8760 hours. HbA1C: No results for input(s): HGBA1C in the last 72 hours. CBG: Recent Labs  Lab 07/21/18 0608 07/21/18 1121 07/21/18 1616 07/21/18 2128 07/22/18 0617  GLUCAP 90 111* 105* 122* 141*   Lipid Profile: No results for input(s): CHOL, HDL, LDLCALC, TRIG, CHOLHDL, LDLDIRECT in the last 72 hours. Thyroid Function Tests: No results for input(s): TSH, T4TOTAL, FREET4, T3FREE, THYROIDAB in the last 72 hours. Anemia Panel: No results for input(s): VITAMINB12, FOLATE, FERRITIN, TIBC, IRON, RETICCTPCT in the last 72 hours. Urine analysis:    Component Value Date/Time   COLORURINE LT. YELLOW 06/26/2013 1200   APPEARANCEUR CLEAR 06/26/2013 1200   LABSPEC 1.020 06/26/2013 1200   PHURINE 8.5 06/26/2013 1200   GLUCOSEU 100 06/26/2013 1200   HGBUR MODERATE 06/26/2013 1200   BILIRUBINUR NEGATIVE 06/26/2013 1200   KETONESUR NEGATIVE 06/26/2013 1200   PROTEINUR >300 (A) 01/04/2010 1032   UROBILINOGEN 0.2 06/26/2013 1200   NITRITE NEGATIVE 06/26/2013 1200   LEUKOCYTESUR TRACE 06/26/2013 1200   Sepsis Labs: @LABRCNTIP (procalcitonin:4,lacticidven:4)  ) Recent Results (from the past 240 hour(s))  Blood Culture (routine x 2)     Status: Abnormal   Collection Time: 07/16/18  2:54 PM  Result Value Ref Range Status   Specimen Description BLOOD LEFT ANTECUBITAL  Final   Special Requests   Final    BOTTLES DRAWN AEROBIC AND ANAEROBIC Blood Culture  results may not be optimal due to an inadequate volume of blood received in culture bottles   Culture  Setup Time   Final    GRAM NEGATIVE RODS ANAEROBIC BOTTLE ONLY CRITICAL RESULT CALLED TO, READ BACK BY AND VERIFIED WITH: E. MARTIN PHARMD, AT 1058 07/17/18 BY D. VANHOOK AEROBIC BOTTLE ONLY GRAM POSITIVE COCCI Organism ID to follow CRITICAL RESULT CALLED TO, READ BACK BY AND VERIFIED WITH: V BRYK PHARMD 07/17/18 2359 JDW CORRECTED RESULTS PREVIOUSLY REPORTED AS: GRAM POSITIVE RODS CORRECTED RESULTS CALLED TO: PHARMD R RUMBERG 07/18/18 AT 1315 BY CM    Culture (A)  Final    KLEBSIELLA PNEUMONIAE STAPHYLOCOCCUS SPECIES (COAGULASE NEGATIVE) THE SIGNIFICANCE OF ISOLATING THIS ORGANISM FROM A SINGLE SET OF BLOOD CULTURES WHEN MULTIPLE SETS ARE DRAWN IS UNCERTAIN. PLEASE NOTIFY THE MICROBIOLOGY DEPARTMENT WITHIN ONE WEEK IF SPECIATION AND SENSITIVITIES ARE REQUIRED. Performed at [facility], [place] [address], [place], [place] [address]    Report Status 07/19/2018 FINAL  Final   Organism ID, Bacteria KLEBSIELLA PNEUMONIAE  Final      Susceptibility   Klebsiella pneumoniae - MIC*    AMPICILLIN >=32 RESISTANT Resistant     CEFAZOLIN <=4 SENSITIVE Sensitive     CEFEPIME <=1 SENSITIVE Sensitive     CEFTAZIDIME <=1 SENSITIVE Sensitive     CEFTRIAXONE <=1 SENSITIVE Sensitive     CIPROFLOXACIN <=0.25 SENSITIVE Sensitive     GENTAMICIN <=1 SENSITIVE Sensitive     IMIPENEM <=0.25 SENSITIVE Sensitive     TRIMETH/SULFA <=20 SENSITIVE Sensitive     AMPICILLIN/SULBACTAM 4 SENSITIVE Sensitive     PIP/TAZO <=4 SENSITIVE Sensitive     Extended ESBL NEGATIVE Sensitive     * KLEBSIELLA PNEUMONIAE  Blood Culture ID Panel (Reflexed)     Status: Abnormal   Collection Time: 07/16/18  2:54 PM  Result Value Ref Range Status   Enterococcus species NOT DETECTED NOT DETECTED Final   Listeria monocytogenes NOT DETECTED NOT DETECTED Final   Staphylococcus species DETECTED (A) NOT DETECTED Final     Comment: Methicillin (oxacillin) susceptible coagulase negative staphylococcus. Possible blood culture contaminant (unless isolated from more than one blood culture draw or clinical case suggests pathogenicity). No antibiotic treatment is  indicated for blood  culture contaminants. CRITICAL RESULT CALLED TO, READ BACK BY AND VERIFIED WITH: V BRYK PHARMD 07/17/18 2359 JDW    Staphylococcus aureus (BCID) NOT DETECTED NOT DETECTED Final   Methicillin resistance NOT DETECTED NOT DETECTED Final   Streptococcus species NOT DETECTED NOT DETECTED Final   Streptococcus agalactiae NOT DETECTED NOT DETECTED Final   Streptococcus pneumoniae NOT DETECTED NOT DETECTED Final   Streptococcus pyogenes NOT DETECTED NOT DETECTED Final   Acinetobacter baumannii NOT DETECTED NOT DETECTED Final   Enterobacteriaceae species NOT DETECTED NOT DETECTED Final   Enterobacter cloacae complex NOT DETECTED NOT DETECTED Final   Escherichia coli NOT DETECTED NOT DETECTED Final   Klebsiella oxytoca NOT DETECTED NOT DETECTED Final   Klebsiella pneumoniae NOT DETECTED NOT DETECTED Final   Proteus species NOT DETECTED NOT DETECTED Final   Serratia marcescens NOT DETECTED NOT DETECTED Final   Haemophilus influenzae NOT DETECTED NOT DETECTED Final   Neisseria meningitidis NOT DETECTED NOT DETECTED Final   Pseudomonas aeruginosa NOT DETECTED NOT DETECTED Final   Candida albicans NOT DETECTED NOT DETECTED Final   Candida glabrata NOT DETECTED NOT DETECTED Final   Candida krusei NOT DETECTED NOT DETECTED Final   Candida parapsilosis NOT DETECTED NOT DETECTED Final   Candida tropicalis NOT DETECTED NOT DETECTED Final    Comment: Performed at [facility], [place] [address], [place], [place] [address]  Blood Culture (routine x 2)     Status: None   Collection Time: 07/16/18  3:24 PM  Result Value Ref Range Status   Specimen Description BLOOD LEFT ARM  Final   Special Requests   Final    BOTTLES DRAWN AEROBIC AND ANAEROBIC  Blood Culture adequate volume   Culture   Final    NO GROWTH 5 DAYS Performed at [facility], [address], [place], [place] [address]    Report Status 07/21/2018 FINAL  Final  Culture, blood (routine x 2)     Status: None (Preliminary result)   Collection Time: 07/18/18 11:39 AM  Result Value Ref Range Status   Specimen Description BLOOD SITE NOT SPECIFIED  Final   Special Requests   Final    BOTTLES DRAWN AEROBIC AND ANAEROBIC Blood Culture adequate volume   Culture   Final    NO GROWTH 4 DAYS Performed at [facility], [place] [address], [place], [place] [address]    Report Status PENDING  Incomplete  Culture, blood (routine x 2)     Status: None (Preliminary result)   Collection Time: 07/18/18 11:41 AM  Result Value Ref Range Status   Specimen Description BLOOD LEFT ANTECUBITAL  Final   Special Requests   Final    BOTTLES DRAWN AEROBIC AND ANAEROBIC Blood Culture adequate volume   Culture   Final    NO GROWTH 4 DAYS Performed at [facility], [place] [address] [place], [place] [address]    Report Status PENDING  Incomplete  MRSA PCR Screening     Status: None   Collection Time: 07/18/18 12:16 PM  Result Value Ref Range Status   MRSA by PCR NEGATIVE NEGATIVE Final    Comment:        The GeneXpert MRSA Assay (FDA approved for NASAL specimens only), is one component of a comprehensive MRSA colonization surveillance program. It is not intended to diagnose MRSA infection nor to guide or monitor treatment for MRSA infections. Performed at [facility], [place] [address], [place], [place]  [address]       Studies: No results found.  Scheduled Meds: . allopurinol  100 mg Oral Daily  . aspirin EC  81 mg Oral Daily  . Chlorhexidine Gluconate Cloth  6 each Topical Q0600  . cinacalcet  90 mg Oral BID WC  . citalopram  10 mg Oral QHS  . colesevelam  1,875 mg Oral BID WC  . doxercalciferol  4 mcg Intravenous Q T,Th,Sa-HD  . ezetimibe  10 mg Oral Daily   . ferric citrate  420 mg Oral BID WC  . gabapentin  600 mg Oral QHS  . heparin  5,000 Units Subcutaneous Q8H  . insulin aspart  0-15 Units Subcutaneous TID WC  . insulin aspart  0-5 Units Subcutaneous QHS  . methimazole  2.5 mg Oral Daily  . multivitamin  1 tablet Oral QHS  . pantoprazole  40 mg Oral QPM  . saccharomyces boulardii  250 mg Oral BID    Continuous Infusions: . cefTRIAXone (ROCEPHIN)  IV 2 g (07/22/18 1328)     LOS: 6 days     Kayleen Memos, MD [facility] Pager [phone]  If 7PM-7AM, please contact night-coverage [email or web address] Password [email or web address] 07/22/2018, 2:07 PM

## 2018-07-22 NOTE — Progress Notes (Signed)
I was present at this dialysis session. I have reviewed the session itself and made appropriate changes.   Vital signs in last 24 hours:  Temp:  [97.4 F (36.3 C)-98.4 F (36.9 C)] 98 F (36.7 C) (10/22 0720) Pulse Rate:  [70-81] 72 (10/22 0830) Resp:  [16-20] 18 (10/22 0720) BP: (99-162)/(58-97) 125/71 (10/22 0830) SpO2:  [96 %-99 %] 98 % (10/22 0720) Weight:  [100.5 kg] 100.5 kg (10/22 0720) Weight change:  Filed Weights   07/20/18 1434 07/20/18 1854 07/22/18 0720  Weight: 103.1 kg 99.8 kg 100.5 kg    Recent Labs  Lab 07/22/18 0720  NA 136  K 4.0  CL 99  CO2 22  GLUCOSE 126*  BUN 37*  CREATININE 10.37*  CALCIUM 8.7*  PHOS 6.7*    Recent Labs  Lab 07/16/18 1453  07/18/18 0915 07/18/18 2214 07/20/18 0508 07/22/18 0720  WBC 9.8   < > 5.3 5.8 5.8 5.8  NEUTROABS 8.0*  --  3.2  --   --   --   HGB 12.2*   < > 11.2* 10.9* 10.4* 11.0*  HCT 41.1   < > 36.9* 36.1* 35.1* 35.7*  MCV 102.0*   < > 99.2 99.2 98.9 98.1  PLT 288   < > 273 249 273 276   < > = values in this interval not displayed.    Scheduled Meds: . allopurinol  100 mg Oral Daily  . aspirin EC  81 mg Oral Daily  . Chlorhexidine Gluconate Cloth  6 each Topical Q0600  . cinacalcet  90 mg Oral BID WC  . citalopram  10 mg Oral QHS  . colesevelam  1,875 mg Oral BID WC  . doxercalciferol  4 mcg Intravenous Q T,Th,Sa-HD  . ezetimibe  10 mg Oral Daily  . ferric citrate  420 mg Oral BID WC  . gabapentin  600 mg Oral QHS  . heparin  5,000 Units Subcutaneous Q8H  . insulin aspart  0-15 Units Subcutaneous TID WC  . insulin aspart  0-5 Units Subcutaneous QHS  . methimazole  2.5 mg Oral Daily  . multivitamin  1 tablet Oral QHS  . pantoprazole  40 mg Oral QPM  . saccharomyces boulardii  250 mg Oral BID   Continuous Infusions: . cefTRIAXone (ROCEPHIN)  IV 2 g (07/21/18 1408)   PRN Meds:.acetaminophen, ferric citrate, hydroxypropyl methylcellulose / hypromellose, polyethylene glycol, zolpidem    Dialysis:TTS   4h 350/800 100kg 2/2.25 bath P2 Hep none RUA AVF - hect 4 - mircera 200 last 10/10   Assessment/ Plan:   1. Klebsiella pneu bacteremia- unclear source but was admitted 3 weeks ago with CAP.  On ceftriaxone.   2. ESRD- cont with HD qTTS (was off schedule and upset that he had to wait until yesterday evening to get HD) 3. Anemia: cont with micera and follow 4. CKD-MBD: cont with binders/sensipar/vit D 5. Nutrition: renal 6. Hypertension: stable 7. Back pain- has diffuse thoracic and Lumbar spine disease.  Also with bilateral nephrolithiasis but non-obstructing.  Does not make urine.  Cont to follow.  8. CHF- biventricular 9. Fatty liver disease- with abnormal LFT's 10. OSA on CPAP 11. Disposition- hopeful discharge today or tomorrow per primary svc   Donetta Potts,  MD 07/22/2018, 8:51 AM

## 2018-07-22 NOTE — Plan of Care (Signed)
  Problem: Education: Goal: Knowledge of General Education information will improve Description: Including pain rating scale, medication(s)/side effects and non-pharmacologic comfort measures Outcome: Progressing   Problem: Health Behavior/Discharge Planning: Goal: Ability to manage health-related needs will improve Outcome: Progressing   Problem: Clinical Measurements: Goal: Ability to maintain clinical measurements within normal limits will improve Outcome: Progressing   Problem: Activity: Goal: Risk for activity intolerance will decrease Outcome: Progressing   Problem: Pain Managment: Goal: General experience of comfort will improve Outcome: Progressing   Problem: Safety: Goal: Ability to remain free from injury will improve Outcome: Progressing   

## 2018-07-23 DIAGNOSIS — N186 End stage renal disease: Secondary | ICD-10-CM

## 2018-07-23 DIAGNOSIS — I10 Essential (primary) hypertension: Secondary | ICD-10-CM

## 2018-07-23 DIAGNOSIS — K219 Gastro-esophageal reflux disease without esophagitis: Secondary | ICD-10-CM

## 2018-07-23 DIAGNOSIS — E1121 Type 2 diabetes mellitus with diabetic nephropathy: Secondary | ICD-10-CM

## 2018-07-23 DIAGNOSIS — Z992 Dependence on renal dialysis: Secondary | ICD-10-CM

## 2018-07-23 LAB — CULTURE, BLOOD (ROUTINE X 2)
CULTURE: NO GROWTH
Culture: NO GROWTH
SPECIAL REQUESTS: ADEQUATE
Special Requests: ADEQUATE

## 2018-07-23 LAB — GLUCOSE, CAPILLARY
Glucose-Capillary: 129 mg/dL — ABNORMAL HIGH (ref 70–99)
Glucose-Capillary: 143 mg/dL — ABNORMAL HIGH (ref 70–99)

## 2018-07-23 LAB — HEPATITIS B SURFACE ANTIBODY,QUALITATIVE: Hep B S Ab: NONREACTIVE

## 2018-07-23 MED ORDER — CEFDINIR 300 MG PO CAPS: 300.0000 mg | ORAL_CAPSULE | Freq: Every day | ORAL | 0 refills | Status: DC

## 2018-07-23 MED ORDER — DOXERCALCIFEROL 4 MCG/2ML IV SOLN: 4.0000 ug | INTRAVENOUS | Status: DC

## 2018-07-23 NOTE — Progress Notes (Signed)
Patient discharged home with family on home/health. Discharge instruction given to patient/family. All questions answered and concerns addressed. Patien/family verbalized understanding.

## 2018-07-23 NOTE — Care Management Note (Signed)
Case Management Note  Patient Details  Name: [name] MRN: [id number] Date of Birth: 1951/09/18  Subjective/Objective:    Pt admitted with sepsis. He is from home with spouse. Pt is ESRD. Was active with [facility] for PT/RN prior to admission.                 Action/Plan: Pt discharging home with resumption of [facility] services. CM notified Tiffany with [facility] of the resumption orders and addition of the aide.  Pt will have intermittent supervision at home. His spouse will provide transport home.   Expected Discharge Date:  07/23/18               Expected Discharge Plan:  [place]  In-House Referral:     Discharge planning Services  CM Consult  Post Acute Care Choice:  Home Health, Resumption of Svcs/PTA Provider Choice offered to:     DME Arranged:    DME Agency:     HH Arranged:  RN, PT, Nurse's Aide [place] Agency:  [facility] (formerly [employer])  Status of Service:  Completed, signed off  If discussed at [employer], dates discussed:    Additional Comments:  Pollie Friar, RN 07/23/2018, 10:39 AM

## 2018-07-23 NOTE — Plan of Care (Signed)
  Problem: Education: Goal: Knowledge of General Education information will improve Description Including pain rating scale, medication(s)/side effects and non-pharmacologic comfort measures 07/23/2018 0809 by Linton Flemings, RN Outcome: Adequate for Discharge 07/22/2018 1829 by Linton Flemings, RN Outcome: Progressing Goal: Knowledge of General Education information will improve Description Including pain rating scale, medication(s)/side effects and non-pharmacologic comfort measures Outcome: Adequate for Discharge   Problem: Health Behavior/Discharge Planning: Goal: Ability to manage health-related needs will improve 07/23/2018 0809 by Linton Flemings, RN Outcome: Adequate for Discharge 07/22/2018 1829 by Linton Flemings, RN Outcome: Progressing   Problem: Clinical Measurements: Goal: Ability to maintain clinical measurements within normal limits will improve 07/23/2018 0809 by Linton Flemings, RN Outcome: Adequate for Discharge 07/22/2018 1829 by Linton Flemings, RN Outcome: Progressing   Problem: Activity: Goal: Risk for activity intolerance will decrease 07/23/2018 0809 by Linton Flemings, RN Outcome: Adequate for Discharge 07/22/2018 1829 by Linton Flemings, RN Outcome: Progressing   Problem: Coping: Goal: Level of anxiety will decrease Outcome: Adequate for Discharge   Problem: Pain Managment: Goal: General experience of comfort will improve 07/23/2018 0809 by Linton Flemings, RN Outcome: Adequate for Discharge 07/22/2018 1829 by Linton Flemings, RN Outcome: Progressing   Problem: Safety: Goal: Ability to remain free from injury will improve 07/23/2018 0809 by Linton Flemings, RN Outcome: Adequate for Discharge 07/22/2018 1829 by Linton Flemings, RN Outcome: Progressing   Problem: Education: Goal: Knowledge of General Education information will improve Description Including pain rating scale, medication(s)/side effects and non-pharmacologic comfort  measures 07/23/2018 0809 by Linton Flemings, RN Outcome: Adequate for Discharge 07/22/2018 1829 by Linton Flemings, RN Outcome: Progressing Goal: Knowledge of General Education information will improve Description Including pain rating scale, medication(s)/side effects and non-pharmacologic comfort measures Outcome: Adequate for Discharge   Problem: Health Behavior/Discharge Planning: Goal: Ability to manage health-related needs will improve 07/23/2018 0809 by Linton Flemings, RN Outcome: Adequate for Discharge 07/22/2018 1829 by Linton Flemings, RN Outcome: Progressing   Problem: Clinical Measurements: Goal: Ability to maintain clinical measurements within normal limits will improve 07/23/2018 0809 by Linton Flemings, RN Outcome: Adequate for Discharge 07/22/2018 1829 by Linton Flemings, RN Outcome: Progressing   Problem: Activity: Goal: Risk for activity intolerance will decrease 07/23/2018 0809 by Linton Flemings, RN Outcome: Adequate for Discharge 07/22/2018 1829 by Linton Flemings, RN Outcome: Progressing   Problem: Coping: Goal: Level of anxiety will decrease Outcome: Adequate for Discharge   Problem: Pain Managment: Goal: General experience of comfort will improve 07/23/2018 0809 by Linton Flemings, RN Outcome: Adequate for Discharge 07/22/2018 1829 by Linton Flemings, RN Outcome: Progressing   Problem: Safety: Goal: Ability to remain free from injury will improve 07/23/2018 0809 by Linton Flemings, RN Outcome: Adequate for Discharge 07/22/2018 1829 by Linton Flemings, RN Outcome: Progressing

## 2018-07-23 NOTE — Progress Notes (Signed)
[facility] Progress Note    Subjective:   Feels better but concerned about recurrent infection   Objective:   BP 108/67 (BP Location: Left Arm)   Pulse 82   Temp 98.4 F (36.9 C) (Oral)   Resp 20   Ht 6\' 1"  (1.854 m)   Wt 98.2 kg   SpO2 97%   BMI 28.56 kg/m   Intake/Output: I/O last 3 completed shifts: In: 1080 [P.O.:1080] Out: 2000 [Other:2000]   Intake/Output this shift:  No intake/output data recorded. Weight change:   Physical Exam: Gen:NAD CVS: no rub Resp: cta Abd: benign Ext: no edema, RUE AVF +T/B  Labs: BMET Recent Labs  Lab 07/16/18 1453 07/16/18 1501 07/16/18 2235 07/17/18 0614 07/18/18 2214 07/20/18 0508 07/22/18 0720  NA 140 137  137  --  139 140 139 136  K 5.2* 6.3*  6.3* 5.4* 5.1 4.1 4.7 4.0  CL 98 102  102  --  101 98 101 99  CO2 27  --   --  26 27 23 22   GLUCOSE 196* 194*  194*  --  125* 111* 115* 126*  BUN 41* 61*  61*  --  50* 30* 38* 37*  CREATININE 10.02* 10.70*  10.70*  --  11.22* 8.36* 10.79* 10.37*  ALBUMIN 3.3*  --   --  2.9* 2.7*  --  2.8*  CALCIUM 8.8*  --   --  8.3* 9.0 8.7* 8.7*  PHOS  --   --   --   --  5.1*  --  6.7*   CBC Recent Labs  Lab 07/16/18 1453  07/18/18 0915 07/18/18 2214 07/20/18 0508 07/22/18 0720  WBC 9.8   < > 5.3 5.8 5.8 5.8  NEUTROABS 8.0*  --  3.2  --   --   --   HGB 12.2*   < > 11.2* 10.9* 10.4* 11.0*  HCT 41.1   < > 36.9* 36.1* 35.1* 35.7*  MCV 102.0*   < > 99.2 99.2 98.9 98.1  PLT 288   < > 273 249 273 276   < > = values in this interval not displayed.    @IMGRELPRIORS @ Medications:    . allopurinol  100 mg Oral Daily  . aspirin EC  81 mg Oral Daily  . cefdinir  300 mg Oral q1800  . Chlorhexidine Gluconate Cloth  6 each Topical Q0600  . cinacalcet  90 mg Oral BID WC  . citalopram  10 mg Oral QHS  . colesevelam  1,875 mg Oral BID WC  . doxercalciferol  4 mcg Intravenous Q T,Th,Sa-HD  . ezetimibe  10 mg Oral Daily  . ferric citrate  420 mg Oral BID WC  .  gabapentin  600 mg Oral QHS  . heparin  5,000 Units Subcutaneous Q8H  . insulin aspart  0-15 Units Subcutaneous TID WC  . insulin aspart  0-5 Units Subcutaneous QHS  . methimazole  2.5 mg Oral Daily  . multivitamin  1 tablet Oral QHS  . pantoprazole  40 mg Oral QPM  . saccharomyces boulardii  250 mg Oral BID    Dialysis:TTS [facility] 4h 350/800 100kg 2/2.25 bath P2 Hep none RUA AVF - hect 4 - mircera 200 last 10/10   Assessment/ Plan:  1. Klebsiella pneu bacteremia- unclear source but was admitted 3 weeks ago with CAP. On ceftriaxone.  2. ESRD- cont with HD qTTS (was off schedule and upset that he had to wait until yesterday evening to get HD) 3. Anemia:cont  with micera and follow 4. CKD-MBD:cont with binders/sensipar/vit D 5. Nutrition:renal 6. Hypertension:stable 7. Back pain- has diffuse thoracic and Lumbar spine disease. Also with bilateral nephrolithiasis but non-obstructing. Does not make urine. Cont to follow.  8. CHF- biventricular 9. Fatty liver disease- with abnormal LFT's 10. OSA on CPAP 11. Disposition- hopeful discharge today per primary svc and f/u with outpt HD   Donetta Potts, MD [place] Pager [phone] 07/23/2018, 11:24 AM

## 2018-07-24 ENCOUNTER — Telehealth (HOSPITAL_COMMUNITY): Payer: Self-pay | Admitting: Vascular Surgery

## 2018-07-24 DIAGNOSIS — D631 Anemia in chronic kidney disease: Secondary | ICD-10-CM | POA: Diagnosis not present

## 2018-07-24 DIAGNOSIS — N2581 Secondary hyperparathyroidism of renal origin: Secondary | ICD-10-CM | POA: Diagnosis not present

## 2018-07-24 DIAGNOSIS — Z23 Encounter for immunization: Secondary | ICD-10-CM | POA: Diagnosis not present

## 2018-07-24 DIAGNOSIS — N186 End stage renal disease: Secondary | ICD-10-CM | POA: Diagnosis not present

## 2018-07-24 DIAGNOSIS — E119 Type 2 diabetes mellitus without complications: Secondary | ICD-10-CM | POA: Diagnosis not present

## 2018-07-24 DIAGNOSIS — D509 Iron deficiency anemia, unspecified: Secondary | ICD-10-CM | POA: Diagnosis not present

## 2018-07-24 NOTE — Telephone Encounter (Signed)
Left pt message giving f/u appt w/ db w/ echo 11/13 , asked pt to call back to confirm

## 2018-07-24 NOTE — Discharge Summary (Signed)
Physician Discharge Summary  [name] MHD:[id number] DOB: [birth year] DOA: 07/16/2018  PCP: [name] Borg, MD  Admit date: 07/16/2018 Discharge date: 07/23/2018  Admitted From: Home.  Disposition:  Home.   Recommendations for Outpatient Follow-up:  1. Follow up with PCP in 1-2 weeks 2. Please obtain BMP/CBC in one week 3. Please follow up with nephrology as recommended.    Discharge Condition:stable.  CODE STATUS: full code.  Diet recommendation: Heart Healthy  Brief/Interim Summary: [name] a 67 y.o.malewith medical history significant forDM2 with polyneuropathy, HTN, ESRD on HD Tuesday Thursday Saturday, ischemic cardiomyopathy, chronic combined systolic and diastolicCHF, OSA on CPAP, CAD s/p NSTEMI,hyperthyroidism,HLD, gout, chronic depressionwho was brought to the ED after he became lethargic and not as responsive when on his way to get a TTE. He was awake and alert upon arrival to ED. Rectal temp was 101.4.  CXR showed resolved R lung base opacity and no acute cardiopulmonary disease.   Hospital course complicated by Klebsiella bacteremia with unclear source and intermittent mild bilateral flank pain  Discharge Diagnoses:  Principal Problem:   Sepsis ([place]) Active Problems:   Gout   Depression   Hypertensive heart disease with CHF (congestive heart failure) ([name])   Gastro-esophageal reflux disease without esophagitis   Hyperparathyroidism, secondary ([place])   CAD S/P percutaneous coronary angioplasty   OSA (obstructive sleep apnea)   HLD (hyperlipidemia)   Hyperthyroidism   Neuropathy due to secondary diabetes mellitus ([place])   DM (diabetes mellitus) type II controlled with renal manifestation ([name])   ESRD on hemodialysis ([place])   Essential hypertension  Sepsis 2/2 to klebsiella pneumonia bacteremia, unclear etiology Recently treated for pneumonia Procalcitonin continue to trend down from 16 to 8 to 4 . Completed 5 days of IV Rocephin Switch to  oral cefnidir and complete the course of the antibiotics of 10 to 14 days.   ESRD on hemodialysis Tuesday Thursday Saturday Continue to monitor electrolytes HD 07/22/2018 Renal dialysis diet Continue next dialysis outpatient  Non obstructive nephrolithiasis Does not make urine States pain is mild Treat symptomatically  Anemia of chronic disease the setting of ESRD Hemoglobin stable No sign of overt bleeding  Non-insulin-dependent type 2 diabetes A1c 5.7 on 07/16/2018 Avoid hypoglycemia   Resolving hyperkalemia suspect secondary to advanced renal failure Nephrology following  Abnormal LFTs post cholecystectomy Currently asymptomatic  Combined diastolic and systolic chronic CHF/ Last 2D echo done on 10/17/2016 revealed LVEF 40 to 45%, basal inferior wall hypokinesis with grade 2 diastolic dysfunction No acute issues at this time Continue cardiac medications  Coronary artery disease/ischemic cardiomyopathy status post stenting Continue cardiac medications Continue antiplatelet and antilipid  Hyperthyroidism Continue methimazole  OSA Continue CPAP at night    Discharge Instructions  Discharge Instructions    AMB Referral to [facility]   Complete by:  As directed    Please assign to [facility] for CHF and complex case management. [facility] Pharmacist for medication affordability. PCP office [name] at [place]) listed as doing toc. Written consent obtained. Wife- [name] [phone] to be contacted for post discharge calls. Currently at [facility]. Goes to HD on T, [place], Sat. Please call with questions. [name]   Reason for consult:  Please assign [facility] Pharmacist   Diagnoses of:   Heart Failure Diabetes Kidney Failure     Expected date of contact:  1-3 days (reserved for hospital discharges)   Diet - low sodium  heart healthy   Complete by:  As  directed    Discharge instructions   Complete by:  As directed    Please follow up with PCP in one week.     Allergies as of 07/23/2018      Reactions   Cephalexin Swelling, Other (See Comments)   Tolerated Rocephin 10/19. Tongue swelling, but no breathing issues Tolerated Zosyn in Sept 2019 without any issues   Statins Other (See Comments)   Weak muscles   Ciprofloxacin Rash      Medication List    STOP taking these medications   midodrine 10 MG tablet Commonly known as:  PROAMATINE     TAKE these medications   acetaminophen 325 MG tablet Commonly known as:  TYLENOL Take 650 mg by mouth every 6 (six) hours as needed for mild pain or headache.   Alirocumab 150 MG/ML Sopn Inject 150 mg into the skin every 14 (fourteen) days.   allopurinol 100 MG tablet Commonly known as:  ZYLOPRIM TAKE ONE TABLET BY MOUTH ONCE DAILY   aspirin EC 81 MG tablet Take 81 mg by mouth daily.   AURYXIA 1 GM 210 MG(Fe) tablet Generic drug:  ferric citrate Take 210-420 mg by mouth See admin instructions. Take 420 mg by mouth two times a day and 210 mg with each snack   betamethasone dipropionate 0.05 % cream Commonly known as:  DIPROLENE Apply 1 application topically daily as needed (leg sores).   cefdinir 300 MG capsule Commonly known as:  OMNICEF Take 1 capsule (300 mg total) by mouth daily at 6 PM.   citalopram 10 MG tablet Commonly known as:  CELEXA Take 1 tablet (10 mg total) by mouth at bedtime.   doxercalciferol 4 MCG/2ML injection Commonly known as:  [name] Inject 2 mLs (4 mcg total) into the vein Every Tuesday,Thursday,and Saturday with dialysis.   ezetimibe 10 MG tablet Commonly known as:  ZETIA TAKE ONE TABLET BY MOUTH ONCE DAILY   feeding supplement (PRO-STAT SUGAR FREE 64) Liqd Take 30 mLs by mouth 2 (two) times daily.   gabapentin 300 MG capsule Commonly known as:  NEURONTIN Take 1 capsule (300 mg total) by mouth at bedtime. What changed:    how much to  take  Another medication with the same name was removed. Continue taking this medication, and follow the directions you see here.   hydroxypropyl methylcellulose / hypromellose 2.5 % ophthalmic solution Commonly known as:  ISOPTO TEARS / GONIOVISC Place 1 drop into both eyes 3 (three) times daily as needed for dry eyes.   methimazole 5 MG tablet Commonly known as:  TAPAZOLE Take 0.5 tablets (2.5 mg total) by mouth daily.   multivitamin Tabs tablet Take 1 tablet by mouth daily. Reported on 10/24/2015   pantoprazole 40 MG tablet Commonly known as:  PROTONIX Take 40 mg by mouth every evening.   polyethylene glycol packet Commonly known as:  MIRALAX / GLYCOLAX Take 17 g by mouth daily. What changed:    when to take this  reasons to take this   saccharomyces boulardii 250 MG capsule Commonly known as:  FLORASTOR Take 1 capsule (250 mg total) by mouth 2 (two) times daily.   SENSIPAR 90 MG tablet Generic drug:  cinacalcet Take 90 mg by mouth 2 (two) times daily.   WELCHOL 625 MG tablet Generic drug:  colesevelam TAKE THREE TABLETS BY MOUTH TWICE DAILY WITH MEALS   zolpidem 5 MG tablet Commonly known as:  AMBIEN Take 1 tablet (5  mg total) by mouth at bedtime as needed. for sleep       Allergies  Allergen Reactions  . Cephalexin Swelling and Other (See Comments)    Tolerated Rocephin 10/19. Tongue swelling, but no breathing issues Tolerated Zosyn in Sept 2019 without any issues  . Statins Other (See Comments)    Weak muscles  . Ciprofloxacin Rash    Consultations:  Nephrology.    Procedures/Studies: Ct Abdomen Pelvis Wo Contrast  Result Date: 07/16/2018 CLINICAL DATA:  Abdominal pain with fever.  Confusion and weakness. EXAM: CT ABDOMEN AND PELVIS WITHOUT CONTRAST TECHNIQUE: Multidetector CT imaging of the abdomen and pelvis was performed following the standard protocol without IV contrast. COMPARISON:  04/22/2017 FINDINGS: Lower chest: Cardiomegaly with  coronary arteriosclerosis and aortic atherosclerosis. No active pulmonary disease. Mild bronchiectasis in the right lower lobe. Hepatobiliary: Pneumobilia is redemonstrated presumably from prior intervention. Cholecystectomy clips are present. The unenhanced liver is unremarkable given limitations of a noncontrast study. No definite mass identified. Pancreas: Unremarkable. No pancreatic ductal dilatation or surrounding inflammatory changes. Spleen: Normal in size without focal abnormality. Adrenals/Urinary Tract: Atrophic bilateral kidneys with stable cysts. Normal bilateral adrenal glands. Renovascular calcifications are identified. A few nonobstructing bilateral renal calculi also present. No hydroureteronephrosis. Decompressed urinary bladder. Stomach/Bowel: The stomach is decompressed in appearance. The duodenal sweep and ligament of Treitz are normal. No small bowel obstruction or inflammation. Increased fecal retention within the colon with scattered colonic diverticulosis without acute diverticulitis. Normal-appearing appendix. Vascular/Lymphatic: Moderate aortoiliac branch vessel atherosclerosis with mild ectasia of the common iliac arteries up to 19 mm in diameter on right and 13 mm on left. Nonaneurysmal thoracic aorta. No lymphadenopathy. Reproductive: Normal size prostate. Surgical clips are noted projecting posterior to prostate gland. Other: No abscess, free air nor abdominopelvic ascites. Musculoskeletal: Degenerative disc disease L1-2 and L2-3 with evidence of prior posterior lumbar interbody fusion at L4-5. Interbody blocks noted at L3-4. IMPRESSION: 1. Cardiomegaly with coronary arteriosclerosis. 2. Aortoiliac atherosclerosis with ectasia of the common iliac arteries. No abdominal aortic aneurysm. 3. Atrophic kidneys with bilateral stable appearing renal cysts and nonobstructing bilateral renal calculi. 4. Status post cholecystectomy with biliary redemonstrated likely from prior intervention. 5.  Thoracolumbar spondylosis with evidence of prior posterior lumbar fusion at L4-5 with well incorporated interbody block and interbody disc placed at L3-4. Electronically Signed   By: Ashley Royalty M.D.   On: 07/16/2018 16:28   Dg Chest 2 View  Result Date: 07/16/2018 CLINICAL DATA:  67 year old male with fever since last night. Right flank pain. EXAM: CHEST - 2 VIEW COMPARISON:  Chest radiographs 06/10/2018 and earlier. FINDINGS: Stable lung volumes but resolved patchy medial right lung base opacity since September. Stable cardiac size and mediastinal contours. Calcified aortic atherosclerosis. Visualized tracheal air column is within normal limits. No pneumothorax, pulmonary edema, pleural effusion or confluent pulmonary opacity today. Partially visible cervical ACDF. Right axillary vascular stent redemonstrated. No acute osseous abnormality identified. IMPRESSION: 1. No acute cardiopulmonary abnormality. Resolved right lung base opacity since September. 2.  Aortic Atherosclerosis (ICD10-I70.0). Electronically Signed   By: Genevie Ann M.D.   On: 07/16/2018 16:02   Ct Head Wo Contrast  Result Date: 07/16/2018 CLINICAL DATA:  Weakness since this morning EXAM: CT HEAD WITHOUT CONTRAST TECHNIQUE: Contiguous axial images were obtained from the base of the skull through the vertex without intravenous contrast. COMPARISON:  06/11/2018 FINDINGS: Brain: Mild chronic ischemic changes in the periventricular white matter. Mild global atrophy. No mass effect, midline shift, or acute intracranial hemorrhage.  Vascular: No hyperdense vessel or unexpected calcification. Skull: Cranium is intact. Sinuses/Orbits: Mastoid air cells are clear. Minimal mucosal thickening in the sphenoid, frontal, and ethmoid sinus. Orbits are within normal limits. Other: Noncontributory. IMPRESSION: No acute intracranial pathology. Electronically Signed   By: Marybelle Killings M.D.   On: 07/16/2018 14:14   Mr Thoracic Spine Wo Contrast  Result Date:  07/16/2018 CLINICAL DATA:  Initial evaluation for fever of unknown origin, inability to lift legs. EXAM: MRI THORACIC AND LUMBAR SPINE WITHOUT CONTRAST TECHNIQUE: Multiplanar and multiecho pulse sequences of the thoracic and lumbar spine were obtained without intravenous contrast. COMPARISON:  Comparison made with prior MRI from 06/28/2017. FINDINGS: MRI THORACIC SPINE FINDINGS Alignment: Vertebral bodies normally aligned with preservation of the normal thoracic kyphosis. No listhesis. Vertebrae: Vertebral body heights maintained without evidence for acute or chronic fracture. Bone marrow signal intensity within normal limits. Multilevel discogenic reactive endplate changes seen throughout the thoracic spine, most notable at T2-3, T5-6, and T10-11. No discrete or worrisome osseous lesions. No findings to suggest discitis. Reactive marrow edema about the right [id number] facet almost certainly degenerative in nature (series 18, image 5). No other abnormal marrow edema. Cord: Question faint cord signal abnormality within the thoracic spinal cord at the level of T10-11 (series 15, image 9). Signal intensity within the thoracic spinal cord is otherwise within normal limits. No epidural collections. Paraspinal and other soft tissues: Paraspinous soft tissues demonstrate no acute finding. Partially visualized lungs are grossly clear. Aortic atherosclerosis noted. Disc levels: Underlying congenital spinal stenosis noted. Postsurgical changes noted within the cervical spine on counter sequence. T1-2: Bilateral facet hypertrophy. No spinal stenosis. Mild bilateral foraminal narrowing. T2-3: Diffuse disc bulge with bilateral facet hypertrophy. Mild spinal stenosis. Moderate left with mild right foraminal narrowing. T3-4: Mild disc bulge. Mild facet hypertrophy. No significant stenosis. Foramina remain patent. T4-5: Mild disc bulge. Mild facet hypertrophy. No significant stenosis. Foramina remain patent. T5-6: Diffuse disc bulge  with prominent reactive endplate changes. Facet and ligament flavum hypertrophy. Mild spinal stenosis. Mild right neural foraminal narrowing. T6-7: Diffuse disc bulge. Left greater than right facet hypertrophy. No significant stenosis. T7-8: Diffuse disc bulge. Reactive endplate changes. Bilateral facet hypertrophy. No significant spinal stenosis. Foramina remain patent. T8-9: Diffuse disc bulge, asymmetric to the left. Prominent reactive endplate changes. Facet hypertrophy. Resultant mild canal stenosis. Mild to moderate bilateral foraminal narrowing. T9-10: Diffuse disc bulge with bilateral facet hypertrophy. Mild spinal stenosis. Mild bilateral foraminal narrowing. T10-11: Diffuse disc bulge with disc desiccation and reactive endplate changes. Bilateral facet hypertrophy. Mild epidural lipomatosis. Resultant moderate spinal stenosis possible faint cord signal abnormality at this level (series 15, image 9). Moderate to severe bilateral foraminal narrowing. T11-12: Diffuse disc bulge with disc desiccation and intervertebral disc space narrowing. Right greater than left facet hypertrophy. Reactive edema about the right T11-12 facet likely degenerative. Resultant moderate spinal stenosis. No definite cord signal changes. Moderate to severe bilateral foraminal narrowing. T12-L1: Mild disc bulge. Bilateral facet hypertrophy. Mild spinal stenosis. Mild to moderate bilateral foraminal narrowing. MRI LUMBAR SPINE FINDINGS Segmentation: Normal segmentation. Lowest well-formed disc labeled the L5-S1 level. Alignment: 4 mm retrolisthesis of L5 on S1. Straightening of the normal lumbar lordosis. Vertebrae: Patient status post interbody fusion at L3-4 and L4-5, with additional posterior fusion at L4-5. Solid arthrodesis at the L4-5 level. Prior posterior decompression has also been performed at L2-3 and L3-4. Vertebral body heights maintained without acute or chronic fracture. Bone marrow signal intensity within normal limits.  No worrisome osseous lesions.  Prominent reactive endplate changes about the L1-2 through L3-4 interspaces. No other abnormal marrow edema. Conus medullaris and cauda equina: Conus extends to the L1 level. Conus and cauda equina appear normal. Paraspinal and other soft tissues: Remote postsurgical changes present within the posterior paraspinous soft tissues. Mild increased STIR signal intensity within the lower posterior paraspinous soft tissues bilaterally, likely due to susceptibility artifact, similar to previous exam. No soft tissue collections. Bilateral renal atrophy with innumerable renal cysts noted. Intra-abdominal aorta ectatic measuring up to 2.7 cm. Disc levels: Underlying congenital spinal stenosis noted. L1-2: Diffuse disc bulge with disc desiccation and intervertebral disc space narrowing. Reactive endplate changes with endplate osteophytic spurring. Moderate facet hypertrophy. Resultant moderate spinal stenosis, stable from previous. Mild to moderate bilateral L1 foraminal narrowing, also unchanged. L2-3: Diffuse disc bulge with disc desiccation and intervertebral disc space narrowing. Reactive endplate changes with marginal endplate osteophytic spurring. Prior posterior decompression. Improved moderate spinal stenosis with the thecal sac measuring 9 mm in AP diameter, previously 6 mm. Residual bilateral facet hypertrophy. Moderate right with moderate to severe left L2 foraminal narrowing, similar to previous. L3-4: Prior interbody fusion. Intervertebral disc space narrowing with endplate osseous ridging. Prior posterior decompression. Residual moderate to severe spinal stenosis with the thecal sac measuring 7 mm in AP diameter, stable from previous. Moderate bilateral L3 foraminal narrowing, also unchanged. L4-5: Prior posterior and interbody fusion with posterior decompression. Wide patency of the thecal sac. No significant foraminal narrowing. Appearance is stable. L5-S1: Mild retrolisthesis.  Diffuse disc bulge with intervertebral disc space narrowing and disc desiccation. Moderate facet hypertrophy. Resultant mild to moderate left lateral recess narrowing without significant canal stenosis, stable. Moderate right with severe left L5 foraminal narrowing, also unchanged. IMPRESSION: MR THORACIC SPINE IMPRESSION 1. Multifactorial degenerative changes at T10-11 with resultant moderate to severe spinal stenosis. Question faint cord signal abnormality at this level, which could reflect edema and/or myelomalacia. 2. Disc bulge with facet hypertrophy at T11-12 with resultant moderate spinal stenosis without cord signal changes. 3. Additional multilevel acquired on congenital canal and foraminal stenosis as detailed above, most notable at T2-3, T8-9, and T9-10. 4. Mild reactive edema about the right T11-12 facet, felt to almost certainly be degenerative in nature. MR LUMBAR SPINE IMPRESSION 1. No acute abnormality within the lumbar spine. 2. Postoperative changes from prior interbody and posterior fusion with decompression at L2-3 through L5-S1 as above. Improved moderate spinal stenosis at L2-3, with unchanged moderate to severe spinal stenosis at L3-4. No residual stenosis at L4-5. 3. Multifactorial degenerative changes with resultant moderate to severe bilateral foraminal narrowing throughout the lumbar spine as above, most notable at L2, L3, and L5 on the left. Electronically Signed   By: Jeannine Boga M.D.   On: 07/16/2018 20:48   Mr Lumbar Spine Wo Contrast  Result Date: 07/16/2018 CLINICAL DATA:  Initial evaluation for fever of unknown origin, inability to lift legs. EXAM: MRI THORACIC AND LUMBAR SPINE WITHOUT CONTRAST TECHNIQUE: Multiplanar and multiecho pulse sequences of the thoracic and lumbar spine were obtained without intravenous contrast. COMPARISON:  Comparison made with prior MRI from 06/28/2017. FINDINGS: MRI THORACIC SPINE FINDINGS Alignment: Vertebral bodies normally aligned with  preservation of the normal thoracic kyphosis. No listhesis. Vertebrae: Vertebral body heights maintained without evidence for acute or chronic fracture. Bone marrow signal intensity within normal limits. Multilevel discogenic reactive endplate changes seen throughout the thoracic spine, most notable at T2-3, T5-6, and T10-11. No discrete or worrisome osseous lesions. No findings to suggest discitis. Reactive marrow  edema about the right Y58-59 facet almost certainly degenerative in nature (series 18, image 5). No other abnormal marrow edema. Cord: Question faint cord signal abnormality within the thoracic spinal cord at the level of T10-11 (series 15, image 9). Signal intensity within the thoracic spinal cord is otherwise within normal limits. No epidural collections. Paraspinal and other soft tissues: Paraspinous soft tissues demonstrate no acute finding. Partially visualized lungs are grossly clear. Aortic atherosclerosis noted. Disc levels: Underlying congenital spinal stenosis noted. Postsurgical changes noted within the cervical spine on counter sequence. T1-2: Bilateral facet hypertrophy. No spinal stenosis. Mild bilateral foraminal narrowing. T2-3: Diffuse disc bulge with bilateral facet hypertrophy. Mild spinal stenosis. Moderate left with mild right foraminal narrowing. T3-4: Mild disc bulge. Mild facet hypertrophy. No significant stenosis. Foramina remain patent. T4-5: Mild disc bulge. Mild facet hypertrophy. No significant stenosis. Foramina remain patent. T5-6: Diffuse disc bulge with prominent reactive endplate changes. Facet and ligament flavum hypertrophy. Mild spinal stenosis. Mild right neural foraminal narrowing. T6-7: Diffuse disc bulge. Left greater than right facet hypertrophy. No significant stenosis. T7-8: Diffuse disc bulge. Reactive endplate changes. Bilateral facet hypertrophy. No significant spinal stenosis. Foramina remain patent. T8-9: Diffuse disc bulge, asymmetric to the left.  Prominent reactive endplate changes. Facet hypertrophy. Resultant mild canal stenosis. Mild to moderate bilateral foraminal narrowing. T9-10: Diffuse disc bulge with bilateral facet hypertrophy. Mild spinal stenosis. Mild bilateral foraminal narrowing. T10-11: Diffuse disc bulge with disc desiccation and reactive endplate changes. Bilateral facet hypertrophy. Mild epidural lipomatosis. Resultant moderate spinal stenosis possible faint cord signal abnormality at this level (series 15, image 9). Moderate to severe bilateral foraminal narrowing. T11-12: Diffuse disc bulge with disc desiccation and intervertebral disc space narrowing. Right greater than left facet hypertrophy. Reactive edema about the right T11-12 facet likely degenerative. Resultant moderate spinal stenosis. No definite cord signal changes. Moderate to severe bilateral foraminal narrowing. T12-L1: Mild disc bulge. Bilateral facet hypertrophy. Mild spinal stenosis. Mild to moderate bilateral foraminal narrowing. MRI LUMBAR SPINE FINDINGS Segmentation: Normal segmentation. Lowest well-formed disc labeled the L5-S1 level. Alignment: 4 mm retrolisthesis of L5 on S1. Straightening of the normal lumbar lordosis. Vertebrae: Patient status post interbody fusion at L3-4 and L4-5, with additional posterior fusion at L4-5. Solid arthrodesis at the L4-5 level. Prior posterior decompression has also been performed at L2-3 and L3-4. Vertebral body heights maintained without acute or chronic fracture. Bone marrow signal intensity within normal limits. No worrisome osseous lesions. Prominent reactive endplate changes about the L1-2 through L3-4 interspaces. No other abnormal marrow edema. Conus medullaris and cauda equina: Conus extends to the L1 level. Conus and cauda equina appear normal. Paraspinal and other soft tissues: Remote postsurgical changes present within the posterior paraspinous soft tissues. Mild increased STIR signal intensity within the lower  posterior paraspinous soft tissues bilaterally, likely due to susceptibility artifact, similar to previous exam. No soft tissue collections. Bilateral renal atrophy with innumerable renal cysts noted. Intra-abdominal aorta ectatic measuring up to 2.7 cm. Disc levels: Underlying congenital spinal stenosis noted. L1-2: Diffuse disc bulge with disc desiccation and intervertebral disc space narrowing. Reactive endplate changes with endplate osteophytic spurring. Moderate facet hypertrophy. Resultant moderate spinal stenosis, stable from previous. Mild to moderate bilateral L1 foraminal narrowing, also unchanged. L2-3: Diffuse disc bulge with disc desiccation and intervertebral disc space narrowing. Reactive endplate changes with marginal endplate osteophytic spurring. Prior posterior decompression. Improved moderate spinal stenosis with the thecal sac measuring 9 mm in AP diameter, previously 6 mm. Residual bilateral facet hypertrophy. Moderate right with  moderate to severe left L2 foraminal narrowing, similar to previous. L3-4: Prior interbody fusion. Intervertebral disc space narrowing with endplate osseous ridging. Prior posterior decompression. Residual moderate to severe spinal stenosis with the thecal sac measuring 7 mm in AP diameter, stable from previous. Moderate bilateral L3 foraminal narrowing, also unchanged. L4-5: Prior posterior and interbody fusion with posterior decompression. Wide patency of the thecal sac. No significant foraminal narrowing. Appearance is stable. L5-S1: Mild retrolisthesis. Diffuse disc bulge with intervertebral disc space narrowing and disc desiccation. Moderate facet hypertrophy. Resultant mild to moderate left lateral recess narrowing without significant canal stenosis, stable. Moderate right with severe left L5 foraminal narrowing, also unchanged. IMPRESSION: MR THORACIC SPINE IMPRESSION 1. Multifactorial degenerative changes at T10-11 with resultant moderate to severe spinal  stenosis. Question faint cord signal abnormality at this level, which could reflect edema and/or myelomalacia. 2. Disc bulge with facet hypertrophy at T11-12 with resultant moderate spinal stenosis without cord signal changes. 3. Additional multilevel acquired on congenital canal and foraminal stenosis as detailed above, most notable at T2-3, T8-9, and T9-10. 4. Mild reactive edema about the right T11-12 facet, felt to almost certainly be degenerative in nature. MR LUMBAR SPINE IMPRESSION 1. No acute abnormality within the lumbar spine. 2. Postoperative changes from prior interbody and posterior fusion with decompression at L2-3 through L5-S1 as above. Improved moderate spinal stenosis at L2-3, with unchanged moderate to severe spinal stenosis at L3-4. No residual stenosis at L4-5. 3. Multifactorial degenerative changes with resultant moderate to severe bilateral foraminal narrowing throughout the lumbar spine as above, most notable at L2, L3, and L5 on the left. Electronically Signed   By: Jeannine Boga M.D.   On: 07/16/2018 20:48   Dg Knee Left Port  Result Date: 07/16/2018 CLINICAL DATA:  Knee joint effusion EXAM: PORTABLE LEFT KNEE - 1-2 VIEW COMPARISON:  None. FINDINGS: No acute fracture. No dislocation. Severe tricompartment osteoarthritic change. Atherosclerotic calcification. Spurring of the superior patella. Small joint effusion. Calcific densities project over the anterior knee joint which may represent loose bodies. IMPRESSION: No acute bony pathology. Small joint effusion. Chronic findings described above. Electronically Signed   By: Marybelle Killings M.D.   On: 07/16/2018 20:51   US Abdomen Limited Ruq  Result Date: 07/16/2018 CLINICAL DATA:  Elevated ALT. EXAM: ULTRASOUND ABDOMEN LIMITED RIGHT UPPER QUADRANT COMPARISON:  CT abdomen and pelvis 07/16/2018 FINDINGS: Gallbladder: Gallbladder is surgically absent. Common bile duct: Diameter: 6 mm, normal Liver: Diffusely increased hepatic  parenchymal echotexture consistent with diffuse fatty infiltration. No focal lesions identified. Portal vein is patent on color Doppler imaging with normal direction of blood flow towards the liver. Incidental finding of cyst in the lower pole right kidney. No hydronephrosis. IMPRESSION: Surgical absence of the gallbladder. Diffuse fatty infiltration of the liver. No evidence of biliary obstruction. Electronically Signed   By: Lucienne Capers M.D.   On: 07/16/2018 23:21       Subjective: No chest pain or sob. No cough. Breathing well. No t requiring oxygen. Stable for discharge.   Discharge Exam: Vitals:   07/23/18 0747 07/23/18 1221  BP: 108/67 130/73  Pulse: 82 79  Resp: 20 16  Temp: 98.4 F (36.9 C) 98.6 F (37 C)  SpO2: 97% 95%   Vitals:   07/22/18 2339 07/23/18 0413 07/23/18 0747 07/23/18 1221  BP: 126/78 129/66 108/67 130/73  Pulse: 85 80 82 79  Resp: 18 18 20 16   Temp: 98 F (36.7 C) 98.1 F (36.7 C) 98.4 F (36.9 C) 98.6  F (37 C)  TempSrc: Oral Oral Oral Oral  SpO2: 96% 94% 97% 95%  Weight:      Height:        General: Pt is alert, awake, not in acute distress Cardiovascular: RRR, S1/S2 +, no rubs, no gallops Respiratory: CTA bilaterally, no wheezing, no rhonchi Abdominal: Soft, NT, ND, bowel sounds + Extremities: no edema, no cyanosis    The results of significant diagnostics from this hospitalization (including imaging, microbiology, ancillary and laboratory) are listed below for reference.     Microbiology: Recent Results (from the past 240 hour(s))  Blood Culture (routine x 2)     Status: Abnormal   Collection Time: 07/16/18  2:54 PM  Result Value Ref Range Status   Specimen Description BLOOD LEFT ANTECUBITAL  Final   Special Requests   Final    BOTTLES DRAWN AEROBIC AND ANAEROBIC Blood Culture results may not be optimal due to an inadequate volume of blood received in culture bottles   Culture  Setup Time   Final    GRAM NEGATIVE RODS ANAEROBIC  BOTTLE ONLY CRITICAL RESULT CALLED TO, READ BACK BY AND VERIFIED WITH: E. MARTIN PHARMD, AT 1058 07/17/18 BY D. VANHOOK AEROBIC BOTTLE ONLY GRAM POSITIVE COCCI Organism ID to follow CRITICAL RESULT CALLED TO, READ BACK BY AND VERIFIED WITH: V BRYK PHARMD 07/17/18 2359 JDW CORRECTED RESULTS PREVIOUSLY REPORTED AS: GRAM POSITIVE RODS CORRECTED RESULTS CALLED TO: PHARMD R RUMBERG 07/18/18 AT 1315 BY CM    Culture (A)  Final    KLEBSIELLA PNEUMONIAE STAPHYLOCOCCUS SPECIES (COAGULASE NEGATIVE) THE SIGNIFICANCE OF ISOLATING THIS ORGANISM FROM A SINGLE SET OF BLOOD CULTURES WHEN MULTIPLE SETS ARE DRAWN IS UNCERTAIN. PLEASE NOTIFY THE MICROBIOLOGY DEPARTMENT WITHIN ONE WEEK IF SPECIATION AND SENSITIVITIES ARE REQUIRED. Performed at [facility], [place] [address], [place], [place] [address]    Report Status 07/19/2018 FINAL  Final   Organism ID, Bacteria KLEBSIELLA PNEUMONIAE  Final      Susceptibility   Klebsiella pneumoniae - MIC*    AMPICILLIN >=32 RESISTANT Resistant     CEFAZOLIN <=4 SENSITIVE Sensitive     CEFEPIME <=1 SENSITIVE Sensitive     CEFTAZIDIME <=1 SENSITIVE Sensitive     CEFTRIAXONE <=1 SENSITIVE Sensitive     CIPROFLOXACIN <=0.25 SENSITIVE Sensitive     GENTAMICIN <=1 SENSITIVE Sensitive     IMIPENEM <=0.25 SENSITIVE Sensitive     TRIMETH/SULFA <=20 SENSITIVE Sensitive     AMPICILLIN/SULBACTAM 4 SENSITIVE Sensitive     PIP/TAZO <=4 SENSITIVE Sensitive     Extended ESBL NEGATIVE Sensitive     * KLEBSIELLA PNEUMONIAE  Blood Culture ID Panel (Reflexed)     Status: Abnormal   Collection Time: 07/16/18  2:54 PM  Result Value Ref Range Status   Enterococcus species NOT DETECTED NOT DETECTED Final   Listeria monocytogenes NOT DETECTED NOT DETECTED Final   Staphylococcus species DETECTED (A) NOT DETECTED Final    Comment: Methicillin (oxacillin) susceptible coagulase negative staphylococcus. Possible blood culture contaminant (unless isolated from more than one blood  culture draw or clinical case suggests pathogenicity). No antibiotic treatment is indicated for blood  culture contaminants. CRITICAL RESULT CALLED TO, READ BACK BY AND VERIFIED WITH: V BRYK PHARMD 07/17/18 2359 JDW    Staphylococcus aureus (BCID) NOT DETECTED NOT DETECTED Final   Methicillin resistance NOT DETECTED NOT DETECTED Final   Streptococcus species NOT DETECTED NOT DETECTED Final   Streptococcus agalactiae NOT DETECTED NOT DETECTED Final   Streptococcus pneumoniae NOT DETECTED NOT  DETECTED Final   Streptococcus pyogenes NOT DETECTED NOT DETECTED Final   Acinetobacter baumannii NOT DETECTED NOT DETECTED Final   Enterobacteriaceae species NOT DETECTED NOT DETECTED Final   Enterobacter cloacae complex NOT DETECTED NOT DETECTED Final   Escherichia coli NOT DETECTED NOT DETECTED Final   Klebsiella oxytoca NOT DETECTED NOT DETECTED Final   Klebsiella pneumoniae NOT DETECTED NOT DETECTED Final   Proteus species NOT DETECTED NOT DETECTED Final   Serratia marcescens NOT DETECTED NOT DETECTED Final   Haemophilus influenzae NOT DETECTED NOT DETECTED Final   Neisseria meningitidis NOT DETECTED NOT DETECTED Final   Pseudomonas aeruginosa NOT DETECTED NOT DETECTED Final   Candida albicans NOT DETECTED NOT DETECTED Final   Candida glabrata NOT DETECTED NOT DETECTED Final   Candida krusei NOT DETECTED NOT DETECTED Final   Candida parapsilosis NOT DETECTED NOT DETECTED Final   Candida tropicalis NOT DETECTED NOT DETECTED Final    Comment: Performed at [facility], [place] [address], [place], [place] [address]  Blood Culture (routine x 2)     Status: None   Collection Time: 07/16/18  3:24 PM  Result Value Ref Range Status   Specimen Description BLOOD LEFT ARM  Final   Special Requests   Final    BOTTLES DRAWN AEROBIC AND ANAEROBIC Blood Culture adequate volume   Culture   Final    NO GROWTH 5 DAYS Performed at [facility], [address], [place], [place] [address]    Report  Status 07/21/2018 FINAL  Final  Culture, blood (routine x 2)     Status: None   Collection Time: 07/18/18 11:39 AM  Result Value Ref Range Status   Specimen Description BLOOD SITE NOT SPECIFIED  Final   Special Requests   Final    BOTTLES DRAWN AEROBIC AND ANAEROBIC Blood Culture adequate volume   Culture   Final    NO GROWTH 5 DAYS Performed at [facility] [address] [place], [place] [address]    Report Status 07/23/2018 FINAL  Final  Culture, blood (routine x 2)     Status: None   Collection Time: 07/18/18 11:41 AM  Result Value Ref Range Status   Specimen Description BLOOD LEFT ANTECUBITAL  Final   Special Requests   Final    BOTTLES DRAWN AEROBIC AND ANAEROBIC Blood Culture adequate volume   Culture   Final    NO GROWTH 5 DAYS Performed at [facility], [place] [address], [name], [place] [address]    Report Status 07/23/2018 FINAL  Final  MRSA PCR Screening     Status: None   Collection Time: 07/18/18 12:16 PM  Result Value Ref Range Status   MRSA by PCR NEGATIVE NEGATIVE Final    Comment:        The GeneXpert MRSA Assay (FDA approved for NASAL specimens only), is one component of a comprehensive MRSA colonization surveillance program. It is not intended to diagnose MRSA infection nor to guide or monitor treatment for MRSA infections. Performed at [facility], [place] [address], [place], [place] [address]      Labs: BNP (last 3 results) No results for input(s): BNP in the last 8760 hours. Basic Metabolic Panel: Recent Labs  Lab 07/18/18 2214 07/20/18 0508 07/22/18 0720  NA 140 139 136  K 4.1 4.7 4.0  CL 98 101 99  CO2 27 23 22   GLUCOSE 111* 115* 126*  BUN 30* 38* 37*  CREATININE 8.36* 10.79* 10.37*  CALCIUM 9.0 8.7* 8.7*  PHOS 5.1*  --  6.7*   Liver Function Tests: Recent Labs  Lab 07/18/18 2214 07/22/18 0720  ALBUMIN 2.7* 2.8*   No results for input(s): LIPASE, AMYLASE in the last 168 hours. No results for input(s): AMMONIA  in the last 168 hours. CBC: Recent Labs  Lab 07/18/18 0915 07/18/18 2214 07/20/18 0508 07/22/18 0720  WBC 5.3 5.8 5.8 5.8  NEUTROABS 3.2  --   --   --   HGB 11.2* 10.9* 10.4* 11.0*  HCT 36.9* 36.1* 35.1* 35.7*  MCV 99.2 99.2 98.9 98.1  PLT 273 249 273 276   Cardiac Enzymes: No results for input(s): CKTOTAL, CKMB, CKMBINDEX, TROPONINI in the last 168 hours. BNP: Invalid input(s): POCBNP CBG: Recent Labs  Lab 07/22/18 1236 07/22/18 1646 07/22/18 2145 07/23/18 0637 07/23/18 1224  GLUCAP 100* 113* 144* 129* 143*   D-Dimer No results for input(s): DDIMER in the last 72 hours. Hgb A1c No results for input(s): HGBA1C in the last 72 hours. Lipid Profile No results for input(s): CHOL, HDL, LDLCALC, TRIG, CHOLHDL, LDLDIRECT in the last 72 hours. Thyroid function studies No results for input(s): TSH, T4TOTAL, T3FREE, THYROIDAB in the last 72 hours.  Invalid input(s): FREET3 Anemia work up No results for input(s): VITAMINB12, FOLATE, FERRITIN, TIBC, IRON, RETICCTPCT in the last 72 hours. Urinalysis    Component Value Date/Time   COLORURINE LT. YELLOW 06/26/2013 1200   APPEARANCEUR CLEAR 06/26/2013 1200   LABSPEC 1.020 06/26/2013 1200   PHURINE 8.5 06/26/2013 1200   GLUCOSEU 100 06/26/2013 1200   HGBUR MODERATE 06/26/2013 1200   BILIRUBINUR NEGATIVE 06/26/2013 1200   KETONESUR NEGATIVE 06/26/2013 1200   PROTEINUR >300 (A) 01/04/2010 1032   UROBILINOGEN 0.2 06/26/2013 1200   NITRITE NEGATIVE 06/26/2013 1200   LEUKOCYTESUR TRACE 06/26/2013 1200   Sepsis Labs Invalid input(s): PROCALCITONIN,  WBC,  LACTICIDVEN Microbiology Recent Results (from the past 240 hour(s))  Blood Culture (routine x 2)     Status: Abnormal   Collection Time: 07/16/18  2:54 PM  Result Value Ref Range Status   Specimen Description BLOOD LEFT ANTECUBITAL  Final   Special Requests   Final    BOTTLES DRAWN AEROBIC AND ANAEROBIC Blood Culture results may not be optimal due to an inadequate volume  of blood received in culture bottles   Culture  Setup Time   Final    GRAM NEGATIVE RODS ANAEROBIC BOTTLE ONLY CRITICAL RESULT CALLED TO, READ BACK BY AND VERIFIED WITH: E. MARTIN PHARMD, AT 1058 07/17/18 BY D. VANHOOK AEROBIC BOTTLE ONLY GRAM POSITIVE COCCI Organism ID to follow CRITICAL RESULT CALLED TO, READ BACK BY AND VERIFIED WITH: V BRYK PHARMD 07/17/18 2359 JDW CORRECTED RESULTS PREVIOUSLY REPORTED AS: GRAM POSITIVE RODS CORRECTED RESULTS CALLED TO: PHARMD R RUMBERG 07/18/18 AT 1315 BY CM    Culture (A)  Final    KLEBSIELLA PNEUMONIAE STAPHYLOCOCCUS SPECIES (COAGULASE NEGATIVE) THE SIGNIFICANCE OF ISOLATING THIS ORGANISM FROM A SINGLE SET OF BLOOD CULTURES WHEN MULTIPLE SETS ARE DRAWN IS UNCERTAIN. PLEASE NOTIFY THE MICROBIOLOGY DEPARTMENT WITHIN ONE WEEK IF SPECIATION AND SENSITIVITIES ARE REQUIRED. Performed at [facility], [place] [address] [place], [place] [address]    Report Status 07/19/2018 FINAL  Final   Organism ID, Bacteria KLEBSIELLA PNEUMONIAE  Final      Susceptibility   Klebsiella pneumoniae - MIC*    AMPICILLIN >=32 RESISTANT Resistant     CEFAZOLIN <=4 SENSITIVE Sensitive     CEFEPIME <=1 SENSITIVE Sensitive  CEFTAZIDIME <=1 SENSITIVE Sensitive     CEFTRIAXONE <=1 SENSITIVE Sensitive     CIPROFLOXACIN <=0.25 SENSITIVE Sensitive     GENTAMICIN <=1 SENSITIVE Sensitive     IMIPENEM <=0.25 SENSITIVE Sensitive     TRIMETH/SULFA <=20 SENSITIVE Sensitive     AMPICILLIN/SULBACTAM 4 SENSITIVE Sensitive     PIP/TAZO <=4 SENSITIVE Sensitive     Extended ESBL NEGATIVE Sensitive     * KLEBSIELLA PNEUMONIAE  Blood Culture ID Panel (Reflexed)     Status: Abnormal   Collection Time: 07/16/18  2:54 PM  Result Value Ref Range Status   Enterococcus species NOT DETECTED NOT DETECTED Final   Listeria monocytogenes NOT DETECTED NOT DETECTED Final   Staphylococcus species DETECTED (A) NOT DETECTED Final    Comment: Methicillin (oxacillin) susceptible coagulase  negative staphylococcus. Possible blood culture contaminant (unless isolated from more than one blood culture draw or clinical case suggests pathogenicity). No antibiotic treatment is indicated for blood  culture contaminants. CRITICAL RESULT CALLED TO, READ BACK BY AND VERIFIED WITH: V BRYK PHARMD 07/17/18 2359 JDW    Staphylococcus aureus (BCID) NOT DETECTED NOT DETECTED Final   Methicillin resistance NOT DETECTED NOT DETECTED Final   Streptococcus species NOT DETECTED NOT DETECTED Final   Streptococcus agalactiae NOT DETECTED NOT DETECTED Final   Streptococcus pneumoniae NOT DETECTED NOT DETECTED Final   Streptococcus pyogenes NOT DETECTED NOT DETECTED Final   Acinetobacter baumannii NOT DETECTED NOT DETECTED Final   Enterobacteriaceae species NOT DETECTED NOT DETECTED Final   Enterobacter cloacae complex NOT DETECTED NOT DETECTED Final   Escherichia coli NOT DETECTED NOT DETECTED Final   Klebsiella oxytoca NOT DETECTED NOT DETECTED Final   Klebsiella pneumoniae NOT DETECTED NOT DETECTED Final   Proteus species NOT DETECTED NOT DETECTED Final   Serratia marcescens NOT DETECTED NOT DETECTED Final   Haemophilus influenzae NOT DETECTED NOT DETECTED Final   Neisseria meningitidis NOT DETECTED NOT DETECTED Final   Pseudomonas aeruginosa NOT DETECTED NOT DETECTED Final   Candida albicans NOT DETECTED NOT DETECTED Final   Candida glabrata NOT DETECTED NOT DETECTED Final   Candida krusei NOT DETECTED NOT DETECTED Final   Candida parapsilosis NOT DETECTED NOT DETECTED Final   Candida tropicalis NOT DETECTED NOT DETECTED Final    Comment: Performed at [facility], [place] [address], [place], [place] [address]  Blood Culture (routine x 2)     Status: None   Collection Time: 07/16/18  3:24 PM  Result Value Ref Range Status   Specimen Description BLOOD LEFT ARM  Final   Special Requests   Final    BOTTLES DRAWN AEROBIC AND ANAEROBIC Blood Culture adequate volume   Culture   Final    NO  GROWTH 5 DAYS Performed at [facility], [address] [place], [place] [address]    Report Status 07/21/2018 FINAL  Final  Culture, blood (routine x 2)     Status: None   Collection Time: 07/18/18 11:39 AM  Result Value Ref Range Status   Specimen Description BLOOD SITE NOT SPECIFIED  Final   Special Requests   Final    BOTTLES DRAWN AEROBIC AND ANAEROBIC Blood Culture adequate volume   Culture   Final    NO GROWTH 5 DAYS Performed at [facility] [address] [place], [place] [address]    Report Status 07/23/2018 FINAL  Final  Culture, blood (routine x 2)     Status: None   Collection Time: 07/18/18 11:41 AM  Result Value Ref Range Status   Specimen Description BLOOD LEFT ANTECUBITAL  Final   Special Requests   Final    BOTTLES DRAWN AEROBIC AND ANAEROBIC Blood Culture adequate volume   Culture   Final    NO GROWTH 5 DAYS Performed at [facility], [address], [place], [place] [address]    Report Status 07/23/2018 FINAL  Final  MRSA PCR Screening     Status: None   Collection Time: 07/18/18 12:16 PM  Result Value Ref Range Status   MRSA by PCR NEGATIVE NEGATIVE Final    Comment:        The GeneXpert MRSA Assay (FDA approved for NASAL specimens only), is one component of a comprehensive MRSA colonization surveillance program. It is not intended to diagnose MRSA infection nor to guide or monitor treatment for MRSA infections. Performed at [facility], [place] [address], [place],  [address]      Time coordinating discharge: 39 minutes  SIGNED:   Hosie Poisson, MD  [facility] 07/24/2018, 8:27 AM Pager   If 7PM-7AM, please contact night-coverage [email or web address] Password [email or web address]

## 2018-07-25 ENCOUNTER — Other Ambulatory Visit: Payer: Self-pay | Admitting: *Deleted

## 2018-07-25 ENCOUNTER — Telehealth: Payer: Self-pay | Admitting: *Deleted

## 2018-07-25 NOTE — Telephone Encounter (Signed)
Pt was on TCM report need to make hosp f./u appt. Called pt no answer LMOM RTC. Sent CRM to [facility] for fyi.Marland KitchenJohny Chess

## 2018-07-25 NOTE — Patient Outreach (Signed)
[name]) Care Management  07/25/2018  SHRAGA CUSTARD 04-Sep-1951 [id number]   Referral received from [name] liaison as member was recently discharged.  Hospitalized 10/16-10/23 for sepsis.  He is a dialysis patient, treatment Tuesdays/Thursdays/Saturdays.  Per chart, he also has history of hypertension, cardiomyopathy, congestive heart failure, CAD, OSA, and diabetes.  Noted that wife is best contact person.  Call placed to wife, no answer.  HIPAA compliant voice message left.  Unsuccessful outreach letter sent to member, will follow up within the next 4 business days.     Update:  Call received back from member's wife.  Member's identity verified.  This care manager introduced self and purpose of call.  [facility] care management services explained, she is familiar as member was active in the past.  She report he is doing well, was seen by home health to have services restarted.  He will have PT/OT, and nursing.  Has not scheduled appointment for primary MD follow up but will do so.  He is compliant with dialysis.  She agrees to home visit, earliest available date for this care manager and member/wife is 11/8.  Denies any urgent concerns, advised to contact this care manager with questions.  [facility] CM Care Plan Problem One     Most Recent Value  Care Plan Problem One  Patient at risk for hospitalization related to sepsis as evidenced by recent hospitalization  Role Documenting the Problem One  Care Management [place] for Problem One  Active  [place] [name] Long Term Goal   Patient will not have a [name] admission within the next 31 days  TH[facility] Long Term Goal Start Date  07/25/18  Interventions for Problem One Long Term Goal  Discussed with member the importance of following discharge instructions, including follow up appointments, medications, diet, and home health involvement, to decrease the risk of readmission  [facility] CM Short Term Goal #1   Member will have follow up  appointment with primary MD within the next 2 weeks  THN CM Short Term Goal #1 Start Date  07/25/18  Interventions for Short Term Goal #1  Wife/member encouraged to call MD office to schedule visit.  Educated on decreased risk of readmission if follow up within 2 weeks of discharge  THN CM Short Term Goal #2   Patient will take all medications as prescribed for the next 4 weeks  THN CM Short Term Goal #2 Start Date  07/25/18  Interventions for Short Term Goal #2  Medications reviewed in the home compared to discharge instructions.  Educated on importance of adherence, especially antibiotics.  Instructed on use of antibiotics and completing course      [name], Therapist, sports, MSN [place] [phone]

## 2018-07-26 DIAGNOSIS — D631 Anemia in chronic kidney disease: Secondary | ICD-10-CM | POA: Diagnosis not present

## 2018-07-26 DIAGNOSIS — E119 Type 2 diabetes mellitus without complications: Secondary | ICD-10-CM | POA: Diagnosis not present

## 2018-07-26 DIAGNOSIS — N2581 Secondary hyperparathyroidism of renal origin: Secondary | ICD-10-CM | POA: Diagnosis not present

## 2018-07-26 DIAGNOSIS — D509 Iron deficiency anemia, unspecified: Secondary | ICD-10-CM | POA: Diagnosis not present

## 2018-07-26 DIAGNOSIS — N186 End stage renal disease: Secondary | ICD-10-CM | POA: Diagnosis not present

## 2018-07-26 DIAGNOSIS — Z23 Encounter for immunization: Secondary | ICD-10-CM | POA: Diagnosis not present

## 2018-07-27 ENCOUNTER — Telehealth: Payer: Self-pay | Admitting: Pharmacist Clinician (PhC)/ Clinical Pharmacy Specialist

## 2018-07-27 DIAGNOSIS — E785 Hyperlipidemia, unspecified: Secondary | ICD-10-CM

## 2018-07-27 NOTE — Telephone Encounter (Signed)
Mailed new PASS application and lab order

## 2018-07-28 ENCOUNTER — Telehealth: Payer: Self-pay | Admitting: Internal Medicine

## 2018-07-28 DIAGNOSIS — N186 End stage renal disease: Secondary | ICD-10-CM | POA: Diagnosis not present

## 2018-07-28 DIAGNOSIS — I132 Hypertensive heart and chronic kidney disease with heart failure and with stage 5 chronic kidney disease, or end stage renal disease: Secondary | ICD-10-CM | POA: Diagnosis not present

## 2018-07-28 DIAGNOSIS — E1122 Type 2 diabetes mellitus with diabetic chronic kidney disease: Secondary | ICD-10-CM | POA: Diagnosis not present

## 2018-07-28 DIAGNOSIS — I5042 Chronic combined systolic (congestive) and diastolic (congestive) heart failure: Secondary | ICD-10-CM | POA: Diagnosis not present

## 2018-07-28 DIAGNOSIS — J449 Chronic obstructive pulmonary disease, unspecified: Secondary | ICD-10-CM | POA: Diagnosis not present

## 2018-07-28 DIAGNOSIS — D631 Anemia in chronic kidney disease: Secondary | ICD-10-CM | POA: Diagnosis not present

## 2018-07-28 NOTE — Telephone Encounter (Signed)
Copied from [place] [id number]. Topic: Quick Communication - Home Health Verbal Orders >> Jul 28, 2018  4:40 PM Percell Belt A wrote: Caller/Agency: Gennaro Africa with [facility] at home  Callback Number: [phone] Requesting OT/PT/Skilled Nursing/Social Work: Need [place] to Resume PT  Frequency: 2 week 3

## 2018-07-28 NOTE — Telephone Encounter (Signed)
Tried calling pt again still no answer LMOM RTC ASAP to schedule hosp f/u w/PCP. Close encounter TCM appt must be made with 24/48 hours of discharge.Marland KitchenJohny Chess

## 2018-07-29 DIAGNOSIS — Z23 Encounter for immunization: Secondary | ICD-10-CM | POA: Diagnosis not present

## 2018-07-29 DIAGNOSIS — E119 Type 2 diabetes mellitus without complications: Secondary | ICD-10-CM | POA: Diagnosis not present

## 2018-07-29 DIAGNOSIS — D509 Iron deficiency anemia, unspecified: Secondary | ICD-10-CM | POA: Diagnosis not present

## 2018-07-29 DIAGNOSIS — N2581 Secondary hyperparathyroidism of renal origin: Secondary | ICD-10-CM | POA: Diagnosis not present

## 2018-07-29 DIAGNOSIS — D631 Anemia in chronic kidney disease: Secondary | ICD-10-CM | POA: Diagnosis not present

## 2018-07-29 DIAGNOSIS — N186 End stage renal disease: Secondary | ICD-10-CM | POA: Diagnosis not present

## 2018-07-29 NOTE — Telephone Encounter (Signed)
Verbal orders given  

## 2018-07-30 DIAGNOSIS — L603 Nail dystrophy: Secondary | ICD-10-CM | POA: Diagnosis not present

## 2018-07-30 DIAGNOSIS — L84 Corns and callosities: Secondary | ICD-10-CM | POA: Diagnosis not present

## 2018-07-30 DIAGNOSIS — L602 Onychogryphosis: Secondary | ICD-10-CM | POA: Diagnosis not present

## 2018-07-30 DIAGNOSIS — E1351 Other specified diabetes mellitus with diabetic peripheral angiopathy without gangrene: Secondary | ICD-10-CM | POA: Diagnosis not present

## 2018-07-30 LAB — HEPATITIS B SURFACE ANTIBODY,QUALITATIVE

## 2018-07-31 DIAGNOSIS — E119 Type 2 diabetes mellitus without complications: Secondary | ICD-10-CM | POA: Diagnosis not present

## 2018-07-31 DIAGNOSIS — D509 Iron deficiency anemia, unspecified: Secondary | ICD-10-CM | POA: Diagnosis not present

## 2018-07-31 DIAGNOSIS — N186 End stage renal disease: Secondary | ICD-10-CM | POA: Diagnosis not present

## 2018-07-31 DIAGNOSIS — Z23 Encounter for immunization: Secondary | ICD-10-CM | POA: Diagnosis not present

## 2018-07-31 DIAGNOSIS — N2581 Secondary hyperparathyroidism of renal origin: Secondary | ICD-10-CM | POA: Diagnosis not present

## 2018-07-31 DIAGNOSIS — D631 Anemia in chronic kidney disease: Secondary | ICD-10-CM | POA: Diagnosis not present

## 2018-08-01 ENCOUNTER — Other Ambulatory Visit: Payer: Self-pay | Admitting: *Deleted

## 2018-08-01 DIAGNOSIS — J449 Chronic obstructive pulmonary disease, unspecified: Secondary | ICD-10-CM | POA: Diagnosis not present

## 2018-08-01 DIAGNOSIS — E1122 Type 2 diabetes mellitus with diabetic chronic kidney disease: Secondary | ICD-10-CM | POA: Diagnosis not present

## 2018-08-01 DIAGNOSIS — I5042 Chronic combined systolic (congestive) and diastolic (congestive) heart failure: Secondary | ICD-10-CM | POA: Diagnosis not present

## 2018-08-01 DIAGNOSIS — N2581 Secondary hyperparathyroidism of renal origin: Secondary | ICD-10-CM | POA: Diagnosis not present

## 2018-08-01 DIAGNOSIS — E1129 Type 2 diabetes mellitus with other diabetic kidney complication: Secondary | ICD-10-CM | POA: Diagnosis not present

## 2018-08-01 DIAGNOSIS — Z992 Dependence on renal dialysis: Secondary | ICD-10-CM | POA: Diagnosis not present

## 2018-08-01 DIAGNOSIS — N186 End stage renal disease: Secondary | ICD-10-CM | POA: Diagnosis not present

## 2018-08-01 DIAGNOSIS — R509 Fever, unspecified: Secondary | ICD-10-CM | POA: Diagnosis not present

## 2018-08-01 DIAGNOSIS — D631 Anemia in chronic kidney disease: Secondary | ICD-10-CM | POA: Diagnosis not present

## 2018-08-01 DIAGNOSIS — I132 Hypertensive heart and chronic kidney disease with heart failure and with stage 5 chronic kidney disease, or end stage renal disease: Secondary | ICD-10-CM | POA: Diagnosis not present

## 2018-08-01 DIAGNOSIS — D509 Iron deficiency anemia, unspecified: Secondary | ICD-10-CM | POA: Diagnosis not present

## 2018-08-01 NOTE — Patient Outreach (Signed)
[name]) Care Management  08/01/2018  [name] [birth year] [id number]   Voice message received from wife requesting to change next week's home visit.  Call placed back to wife, no answer.  HIPAA compliant voice message left.  Will follow up next week.  [name], [place], MSN [place] [phone]

## 2018-08-02 DIAGNOSIS — R509 Fever, unspecified: Secondary | ICD-10-CM | POA: Diagnosis not present

## 2018-08-02 DIAGNOSIS — D631 Anemia in chronic kidney disease: Secondary | ICD-10-CM | POA: Diagnosis not present

## 2018-08-02 DIAGNOSIS — N186 End stage renal disease: Secondary | ICD-10-CM | POA: Diagnosis not present

## 2018-08-02 DIAGNOSIS — N2581 Secondary hyperparathyroidism of renal origin: Secondary | ICD-10-CM | POA: Diagnosis not present

## 2018-08-02 DIAGNOSIS — D509 Iron deficiency anemia, unspecified: Secondary | ICD-10-CM | POA: Diagnosis not present

## 2018-08-04 DIAGNOSIS — I132 Hypertensive heart and chronic kidney disease with heart failure and with stage 5 chronic kidney disease, or end stage renal disease: Secondary | ICD-10-CM | POA: Diagnosis not present

## 2018-08-04 DIAGNOSIS — N186 End stage renal disease: Secondary | ICD-10-CM | POA: Diagnosis not present

## 2018-08-04 DIAGNOSIS — I5042 Chronic combined systolic (congestive) and diastolic (congestive) heart failure: Secondary | ICD-10-CM | POA: Diagnosis not present

## 2018-08-04 DIAGNOSIS — J449 Chronic obstructive pulmonary disease, unspecified: Secondary | ICD-10-CM | POA: Diagnosis not present

## 2018-08-04 DIAGNOSIS — D631 Anemia in chronic kidney disease: Secondary | ICD-10-CM | POA: Diagnosis not present

## 2018-08-04 DIAGNOSIS — E1122 Type 2 diabetes mellitus with diabetic chronic kidney disease: Secondary | ICD-10-CM | POA: Diagnosis not present

## 2018-08-05 DIAGNOSIS — N2581 Secondary hyperparathyroidism of renal origin: Secondary | ICD-10-CM | POA: Diagnosis not present

## 2018-08-05 DIAGNOSIS — N186 End stage renal disease: Secondary | ICD-10-CM | POA: Diagnosis not present

## 2018-08-05 DIAGNOSIS — R509 Fever, unspecified: Secondary | ICD-10-CM | POA: Diagnosis not present

## 2018-08-05 DIAGNOSIS — D631 Anemia in chronic kidney disease: Secondary | ICD-10-CM | POA: Diagnosis not present

## 2018-08-05 DIAGNOSIS — D509 Iron deficiency anemia, unspecified: Secondary | ICD-10-CM | POA: Diagnosis not present

## 2018-08-06 DIAGNOSIS — D631 Anemia in chronic kidney disease: Secondary | ICD-10-CM | POA: Diagnosis not present

## 2018-08-06 DIAGNOSIS — N186 End stage renal disease: Secondary | ICD-10-CM | POA: Diagnosis not present

## 2018-08-06 DIAGNOSIS — I5042 Chronic combined systolic (congestive) and diastolic (congestive) heart failure: Secondary | ICD-10-CM | POA: Diagnosis not present

## 2018-08-06 DIAGNOSIS — E1122 Type 2 diabetes mellitus with diabetic chronic kidney disease: Secondary | ICD-10-CM | POA: Diagnosis not present

## 2018-08-06 DIAGNOSIS — J449 Chronic obstructive pulmonary disease, unspecified: Secondary | ICD-10-CM | POA: Diagnosis not present

## 2018-08-06 DIAGNOSIS — I132 Hypertensive heart and chronic kidney disease with heart failure and with stage 5 chronic kidney disease, or end stage renal disease: Secondary | ICD-10-CM | POA: Diagnosis not present

## 2018-08-07 ENCOUNTER — Other Ambulatory Visit: Payer: Self-pay | Admitting: *Deleted

## 2018-08-07 DIAGNOSIS — N2581 Secondary hyperparathyroidism of renal origin: Secondary | ICD-10-CM | POA: Diagnosis not present

## 2018-08-07 DIAGNOSIS — D631 Anemia in chronic kidney disease: Secondary | ICD-10-CM | POA: Diagnosis not present

## 2018-08-07 DIAGNOSIS — D509 Iron deficiency anemia, unspecified: Secondary | ICD-10-CM | POA: Diagnosis not present

## 2018-08-07 DIAGNOSIS — N186 End stage renal disease: Secondary | ICD-10-CM | POA: Diagnosis not present

## 2018-08-07 DIAGNOSIS — R509 Fever, unspecified: Secondary | ICD-10-CM | POA: Diagnosis not present

## 2018-08-07 NOTE — Patient Outreach (Signed)
[facility]) Care Management  08/07/2018  [name] August 25, 1951 [id number]   Call placed to member/wife to follow up on message to cancel tomorrow's home visit.  She report member is "still sick."  She has scheduled an appointment with primary MD tomorrow and he has appointment with heart failure clinic on 11/13.  State he has continued to run a low grade fever (99.3 highest), report feeling tired and weak.  Home health has remained involved.  Attempted to reschedule for next week, however wife report the week is busy with dialysis and MD appointments.  She agrees with follow up from this care manager next week to assess further needs and schedule of follow up.  [name], [place], MSN [place] [phone]

## 2018-08-08 ENCOUNTER — Ambulatory Visit: Payer: Medicare Other | Admitting: *Deleted

## 2018-08-08 ENCOUNTER — Other Ambulatory Visit (INDEPENDENT_AMBULATORY_CARE_PROVIDER_SITE_OTHER): Payer: Medicare Other

## 2018-08-08 ENCOUNTER — Encounter: Payer: Self-pay | Admitting: Internal Medicine

## 2018-08-08 ENCOUNTER — Ambulatory Visit (INDEPENDENT_AMBULATORY_CARE_PROVIDER_SITE_OTHER): Payer: Medicare Other | Admitting: Internal Medicine

## 2018-08-08 VITALS — BP 116/82 | HR 103 | Temp 98.6°F | Ht 73.0 in | Wt 217.0 lb

## 2018-08-08 DIAGNOSIS — E1121 Type 2 diabetes mellitus with diabetic nephropathy: Secondary | ICD-10-CM | POA: Diagnosis not present

## 2018-08-08 DIAGNOSIS — M109 Gout, unspecified: Secondary | ICD-10-CM | POA: Diagnosis not present

## 2018-08-08 DIAGNOSIS — E785 Hyperlipidemia, unspecified: Secondary | ICD-10-CM

## 2018-08-08 DIAGNOSIS — I10 Essential (primary) hypertension: Secondary | ICD-10-CM

## 2018-08-08 DIAGNOSIS — I200 Unstable angina: Secondary | ICD-10-CM | POA: Diagnosis not present

## 2018-08-08 LAB — CBC WITH DIFFERENTIAL/PLATELET
Basophils Absolute: 0.2 10*3/uL — ABNORMAL HIGH (ref 0.0–0.1)
Basophils Relative: 1.9 % (ref 0.0–3.0)
EOS ABS: 0.2 10*3/uL (ref 0.0–0.7)
Eosinophils Relative: 2.5 % (ref 0.0–5.0)
HCT: 39.3 % (ref 39.0–52.0)
HEMOGLOBIN: 12.9 g/dL — AB (ref 13.0–17.0)
LYMPHS ABS: 2 10*3/uL (ref 0.7–4.0)
Lymphocytes Relative: 23.9 % (ref 12.0–46.0)
MCHC: 32.7 g/dL (ref 30.0–36.0)
MCV: 95.3 fl (ref 78.0–100.0)
MONO ABS: 1.4 10*3/uL — AB (ref 0.1–1.0)
Monocytes Relative: 17.1 % — ABNORMAL HIGH (ref 3.0–12.0)
NEUTROS PCT: 54.6 % (ref 43.0–77.0)
Neutro Abs: 4.6 10*3/uL (ref 1.4–7.7)
Platelets: 316 10*3/uL (ref 150.0–400.0)
RBC: 4.13 Mil/uL — AB (ref 4.22–5.81)
RDW: 19.5 % — ABNORMAL HIGH (ref 11.5–15.5)
WBC: 8.5 10*3/uL (ref 4.0–10.5)

## 2018-08-08 LAB — BASIC METABOLIC PANEL
BUN: 41 mg/dL — ABNORMAL HIGH (ref 6–23)
CALCIUM: 9.9 mg/dL (ref 8.4–10.5)
CHLORIDE: 95 meq/L — AB (ref 96–112)
CO2: 26 meq/L (ref 19–32)
Creatinine, Ser: 9.04 mg/dL (ref 0.40–1.50)
GFR: 7.5 mL/min — CL (ref >60.00)
GLUCOSE: 163 mg/dL — AB (ref 70–99)
Potassium: 5.7 mEq/L — ABNORMAL HIGH (ref 3.5–5.1)
SODIUM: 138 meq/L (ref 135–145)

## 2018-08-08 LAB — LIPID PANEL
Cholesterol: 159 mg/dL (ref 0–200)
HDL: 69.3 mg/dL (ref >39.00)
LDL Cholesterol: 63 mg/dL (ref 0–99)
NONHDL: 89.86
TRIGLYCERIDES: 133 mg/dL (ref 0.0–149.0)
Total CHOL/HDL Ratio: 2
VLDL: 26.6 mg/dL (ref 0.0–40.0)

## 2018-08-08 LAB — URIC ACID: URIC ACID, SERUM: 4.3 mg/dL (ref 4.0–7.8)

## 2018-08-08 MED ORDER — METHYLPREDNISOLONE ACETATE 80 MG/ML IJ SUSP
80.0000 mg | Freq: Once | INTRAMUSCULAR | Status: AC
  Administered 2018-08-08: 80 mg via INTRAMUSCULAR

## 2018-08-08 MED ORDER — KETOROLAC TROMETHAMINE 30 MG/ML IJ SOLN
30.0000 mg | Freq: Once | INTRAMUSCULAR | Status: AC
  Administered 2018-08-08: 30 mg via INTRAMUSCULAR

## 2018-08-08 MED ORDER — COLCHICINE 0.6 MG PO TABS: 0.6000 mg | ORAL_TABLET | Freq: Every day | ORAL | 3 refills | Status: DC

## 2018-08-08 NOTE — Patient Instructions (Addendum)
You had the pain and steroid shots today  Please continue all other medications as before, and refills have been done if requested.  Please have the pharmacy call with any other refills you may need.  Please continue your efforts at being more active, low cholesterol diet, and weight control..  Please keep your appointments with your specialists as you may have planned  Please go to the LAB in the Basement (turn left off the elevator) for the tests to be done today  You will be contacted by phone if any changes need to be made immediately.  Otherwise, you will receive a letter about your results with an explanation, but please check with MyChart first.  Please remember to sign up for MyChart if you have not done so, as this will be important to you in the future with finding out test results, communicating by private email, and scheduling acute appointments online when needed.

## 2018-08-08 NOTE — Progress Notes (Signed)
Subjective:    Patient ID: [name], male    DOB: 1950/10/25, 67 y.o.   MRN: [id number]  HPI  Here to f/u recent sepsis and klebsiella bacteremia, source unclear , but tx with IV antibx then oral for home now finished, Pt denies chest pain, increased sob or doe, wheezing, orthopnea, PND, increased LE swelling, palpitations, dizziness or syncope.  Pt denies new neurological symptoms such as new headache, or facial or extremity weakness or numbness   Pt denies polydipsia, polyuria.  Last a1c normal with last hospn.  Wife has a paper to say cardiology Dr Gwenlyn Found is requesting lipids done and he has been fasting today  No fever, cough or urinary symptoms (does not make urine)  No other acute complaints.    Does c/o acute onset 2 days mod to severe left wrist swelling and pain like a boil similar to prior gout in the feet.  Past Medical History:  Diagnosis Date  . Allergic rhinitis, cause unspecified 02/24/2014  . Anemia 06/16/2011  . BENIGN PROSTATIC HYPERTROPHY 10/14/2009  . CAD, NATIVE VESSEL 02/06/2009   a. 06/2007 s/p Taxus DES to the RCA;  b. 08/2016 NSTEMI in setting of SVT/PCI: LM 30ost, LAD 75m (3.0x16 Synergy DES), LCX 52m, OM1 60, RI 40, RCA 70p/m, 14m - not amenable to PCI.  [name] Cervical radiculopathy, chronic 02/23/2016   Right c5-6 by NCS/EMG  . Chronic combined systolic (congestive) and diastolic (congestive) heart failure ([place])    a. 10/2016 Echo: EF 40-45%, Gr2 DD. mildly dil LA.  [name] COLONIC POLYPS, HX OF 10/14/2009  . Dementia ([place]) [id number]  . Depression 09/24/2015  . DIABETES MELLITUS, TYPE II 02/01/2010  . DYSLIPIDEMIA 06/18/2007  . ESRD (end stage renal disease) on dialysis ([place]) 08/04/2010   ESRD due to DM adn HTN, started HD in 2011. As of 2019 has a R arm graft and gets HD on TTS sched  . FOOT PAIN 08/12/2008  . GAIT DISTURBANCE 03/03/2010  . GASTROENTERITIS, VIRAL 10/14/2009  . GERD 06/18/2007  . GOITER, MULTINODULAR 12/26/2007  . GOUT 06/18/2007  . GYNECOMASTIA 07/17/2010  .  Hemodialysis access, fistula mature [facility])    Dialysis T-Th-Sa ([place]) Right upper arm fistula  . Hyperlipidemia 10/16/2011  . Hyperparathyroidism, secondary ([place]) 06/16/2011  . HYPERTENSION 06/18/2007  . Hyperthyroidism   . Hypocalcemia 06/07/2010  . Ischemic cardiomyopathy    a. 10/2016 Echo: EF 40-45%.  . Lumbar stenosis with neurogenic claudication   . ONYCHOMYCOSIS, TOENAILS 12/26/2007  . OSA on CPAP 10/16/2011  . Other malaise and fatigue 11/24/2009  . PERIPHERAL NEUROPATHY 06/18/2007  . Prostate cancer ([place])   . PSVT (paroxysmal supraventricular tachycardia) ([place])    a. 26/9485 complicated by NSTEMI;  b. 11/2016 Treated w/ adenosine in ED;  c. 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  [name] PULMONARY NODULE, RIGHT LOWER LOBE 06/08/2009  . Sleep apnea    cpap machine and o2  . TRANSAMINASES, SERUM, ELEVATED 02/01/2010  . Transfusion history    none recent  . Unspecified hypotension 01/30/2010   Past Surgical History:  Procedure Laterality Date  . A/V SHUNTOGRAM N/A 04/07/2018   Procedure: A/V SHUNTOGRAM;  Surgeon: Waynetta Sandy, MD;  Location: [place] CV LAB;  Service: Cardiovascular;  Laterality: N/A;  . ARTERIOVENOUS GRAFT PLACEMENT Right 2009   forearm/notes 02/01/2011  . AV FISTULA PLACEMENT  11/07/2011   Procedure: INSERTION OF ARTERIOVENOUS (AV) GORE-TEX GRAFT ARM;  Surgeon: Tinnie Gens, MD;  Location: [place];  Service: Vascular;  Laterality: Left;  . BACK SURGERY  1998  . BASCILIC VEIN TRANSPOSITION Right 02/27/2013   Procedure: BASCILIC VEIN TRANSPOSITION;  Surgeon: Mal Misty, MD;  Location: [place];  Service: Vascular;  Laterality: Right;  Right Basilic Vein Transposition   . CARDIAC CATHETERIZATION N/A 08/06/2016   Procedure: Left Heart Cath and Coronary Angiography;  Surgeon: Jolaine Artist, MD;  Location: [place] CV LAB;  Service: Cardiovascular;  Laterality: N/A;  . CARDIAC CATHETERIZATION N/A 08/07/2016   Procedure: Coronary/Graft Atherectomy-CSI LAD;   Surgeon: Peter M Martinique, MD;  Location: [place] CV LAB;  Service: Cardiovascular;  Laterality: N/A;  . CERVICAL SPINE SURGERY  2/09   "to repair nerve problems in my left arm"  . CHOLECYSTECTOMY    . COLONOSCOPY WITH PROPOFOL N/A 04/26/2017   Procedure: COLONOSCOPY WITH PROPOFOL;  Surgeon: Otis Brace, MD;  Location: [place];  Service: Gastroenterology;  Laterality: N/A;  . CORONARY ANGIOPLASTY WITH STENT PLACEMENT  06/11/2008  . CORONARY ANGIOPLASTY WITH STENT PLACEMENT  06/2007   TAXUS stent to RCA/notes 01/31/2011  . ESOPHAGOGASTRODUODENOSCOPY  09/28/2011   Procedure: ESOPHAGOGASTRODUODENOSCOPY (EGD);  Surgeon: Missy Sabins, MD;  Location: [facility] ENDOSCOPY;  Service: Endoscopy;  Laterality: N/A;  . ESOPHAGOGASTRODUODENOSCOPY N/A 04/07/2015   Procedure: ESOPHAGOGASTRODUODENOSCOPY (EGD);  Surgeon: Teena Irani, MD;  Location: [facility];  Service: Endoscopy;  Laterality: N/A;  . ESOPHAGOGASTRODUODENOSCOPY N/A 04/19/2015   Procedure: ESOPHAGOGASTRODUODENOSCOPY (EGD);  Surgeon: Arta Silence, MD;  Location[facility]OSCOPY;  Service: Endoscopy;  Laterality: N/A;  . ESOPHAGOGASTRODUODENOSCOPY (EGD) WITH PROPOFOL N/A 06/13/2018   Procedure: ESOPHAGOGASTRODUODENOSCOPY (EGD) WITH PROPOFOL;  Surgeon: Wonda Horner, MD;  Location: [facility] ENDOSCOPY;  Service: Endoscopy;  Laterality: N/A;  . FLEXIBLE SIGMOIDOSCOPY N/A 05/21/2017   Procedure: Conni Elliot;  Surgeon: Clarene Essex, MD;  Location: [place];  Service: Endoscopy;  Laterality: N/A;  . FLEXIBLE SIGMOIDOSCOPY Left 07/02/2017   Procedure: FLEXIBLE SIGMOIDOSCOPY;  Surgeon: Laurence Spates, MD;  Location: [place];  Service: Endoscopy;  Laterality: Left;  . FOREIGN BODY REMOVAL  09/2003   via upper endoscopy/notes 02/12/2011  . GIVENS CAPSULE STUDY  09/30/2011   Procedure: GIVENS CAPSULE STUDY;  Surgeon: Jeryl Columbia, MD;  Location: [facility] ENDOSCOPY;  Service: Endoscopy;  Laterality: N/A;  . INSERTION OF DIALYSIS CATHETER Right 2014    . INSERTION OF DIALYSIS CATHETER Left 02/11/2013   Procedure: INSERTION OF DIALYSIS CATHETER;  Surgeon: Conrad Weyauwega, MD;  Location: [place];  Service: Vascular;  Laterality: Left;  Ultrasound guided  . LUMBAR LAMINECTOMY/DECOMPRESSION MICRODISCECTOMY Bilateral 07/31/2017   Procedure: LAMINECTOMY AND FORAMINOTOMY- BILATERAL LUMBAR TWO- LUMBAR THREE;  Surgeon: Earnie Larsson, MD;  Location: [place];  Service: Neurosurgery;  Laterality: Bilateral;  LAMINECTOMY AND FORAMINOTOMY- BILATERAL LUMBAR 2- LUMBAR 3  . PERIPHERAL VASCULAR BALLOON ANGIOPLASTY  04/07/2018   Procedure: PERIPHERAL VASCULAR BALLOON ANGIOPLASTY;  Surgeon: Waynetta Sandy, MD;  Location: [place] CV LAB;  Service: Cardiovascular;;  RUE AVF  . REMOVAL OF A DIALYSIS CATHETER Right 02/11/2013   Procedure: REMOVAL OF A DIALYSIS CATHETER;  Surgeon: Conrad Pixley, MD;  Location: [place];  Service: Vascular;  Laterality: Right;  . SAVORY DILATION N/A 04/07/2015   Procedure: SAVORY DILATION;  Surgeon: Teena Irani, MD;  Location: [facility] ENDOSCOPY;  Service: Endoscopy;  Laterality: N/A;  . SHUNTOGRAM N/A 09/20/2011   Procedure: Earney Mallet;  Surgeon: Conrad Sunburst, MD;  Location: [facility] CATH LAB;  Service: Cardiovascular;  Laterality: N/A;  . SVT ABLATION N/A 11/26/2016   Procedure: SVT Ablation;  Surgeon:  Will Meredith Leeds, MD;  Location: [place] CV LAB;  Service: Cardiovascular;  Laterality: N/A;  . TONSILLECTOMY    . TOTAL KNEE ARTHROPLASTY Right 08/02/2015   Procedure: TOTAL KNEE ARTHROPLASTY;  Surgeon: Renette Butters, MD;  Location: [place];  Service: Orthopedics;  Laterality: Right;  . VENOGRAM N/A 01/26/2013   Procedure: VENOGRAM;  Surgeon: Angelia Mould, MD;  Location: [facility] CATH LAB;  Service: Cardiovascular;  Laterality: N/A;    reports that he quit smoking about 12 years ago. His smoking use included cigarettes. He has a 25.00 pack-year smoking history. He quit smokeless tobacco use about 12 years ago. He reports that he does not  drink alcohol or use drugs. family history includes Diabetes in his father; Healthy in his child, child, and child; Heart disease in his father and sister; Hypertension in his father; Kidney failure in his father; Thyroid nodules in his sister. Allergies  Allergen Reactions  . Cephalexin Swelling and Other (See Comments)    Tolerated Rocephin 10/19. Tongue swelling, but no breathing issues Tolerated Zosyn in Sept 2019 without any issues  . Statins Other (See Comments)    Weak muscles  . Ciprofloxacin Rash   Current Outpatient Medications on File Prior to Visit  Medication Sig Dispense Refill  . acetaminophen (TYLENOL) 325 MG tablet Take 650 mg by mouth every 6 (six) hours as needed for mild pain or headache.     . Alirocumab (PRALUENT) 150 MG/ML SOPN Inject 150 mg into the skin every 14 (fourteen) days. 2 pen 12  . allopurinol (ZYLOPRIM) 100 MG tablet TAKE ONE TABLET BY MOUTH ONCE DAILY 90 tablet 3  . Amino Acids-Protein Hydrolys (FEEDING SUPPLEMENT, PRO-STAT SUGAR FREE 64,) LIQD Take 30 mLs by mouth 2 (two) times daily. 900 mL 0  . aspirin EC 81 MG tablet Take 81 mg by mouth daily.    . betamethasone dipropionate (DIPROLENE) 0.05 % cream Apply 1 application topically daily as needed (leg sores).    . cefdinir (OMNICEF) 300 MG capsule Take 1 capsule (300 mg total) by mouth daily at 6 PM. 9 capsule 0  . citalopram (CELEXA) 10 MG tablet Take 1 tablet (10 mg total) by mouth at bedtime. 90 tablet 3  . doxercalciferol ([name]) 4 MCG/2ML injection Inject 2 mLs (4 mcg total) into the vein Every Tuesday,Thursday,and Saturday with dialysis. 2 mL   . ezetimibe (ZETIA) 10 MG tablet TAKE ONE TABLET BY MOUTH ONCE DAILY 90 tablet 3  . ferric citrate (AURYXIA) 1 GM 210 MG(Fe) tablet Take 210-420 mg by mouth See admin instructions. Take 420 mg by mouth two times a day and 210 mg with each snack    . gabapentin (NEURONTIN) 300 MG capsule Take 1 capsule (300 mg total) by mouth at bedtime. (Patient taking  differently: Take 600 mg by mouth at bedtime. )    . hydroxypropyl methylcellulose / hypromellose (ISOPTO TEARS / GONIOVISC) 2.5 % ophthalmic solution Place 1 drop into both eyes 3 (three) times daily as needed for dry eyes. 15 mL 11  . methimazole (TAPAZOLE) 5 MG tablet Take 0.5 tablets (2.5 mg total) by mouth daily. 45 tablet 3  . multivitamin (RENA-VIT) TABS tablet Take 1 tablet by mouth daily. Reported on 10/24/2015    . pantoprazole (PROTONIX) 40 MG tablet Take 40 mg by mouth every evening.     . polyethylene glycol (MIRALAX / GLYCOLAX) packet Take 17 g by mouth daily. (Patient taking differently: Take 17 g by mouth daily as needed for mild  constipation. ) 14 each 0  . saccharomyces boulardii (FLORASTOR) 250 MG capsule Take 1 capsule (250 mg total) by mouth 2 (two) times daily. 20 capsule 0  . SENSIPAR 90 MG tablet Take 90 mg by mouth 2 (two) times daily.    Earnestine Mealing 625 MG tablet TAKE THREE TABLETS BY MOUTH TWICE DAILY WITH MEALS 180 tablet 11  . zolpidem (AMBIEN) 5 MG tablet Take 1 tablet (5 mg total) by mouth at bedtime as needed. for sleep 90 tablet 1   No current facility-administered medications on file prior to visit.    Review of Systems  Constitutional: Negative for other unusual diaphoresis or sweats HENT: Negative for ear discharge or swelling Eyes: Negative for other worsening visual disturbances Respiratory: Negative for stridor or other swelling  Gastrointestinal: Negative for worsening distension or other blood Genitourinary: Negative for retention or other urinary change Musculoskeletal: Negative for other MSK pain or swelling Skin: Negative for color change or other new lesions Neurological: Negative for worsening tremors and other numbness  Psychiatric/Behavioral: Negative for worsening agitation or other fatigue All other system neg per pt    Objective:   Physical Exam BP 116/82   Pulse (!) 103   Temp 98.6 F (37 C) (Oral)   Ht 6\' 1"  (1.854 m)   Wt 217 lb  (98.4 kg)   SpO2 94%   BMI 28.63 kg/m  VS noted,  Constitutional: Pt appears in NAD HENT: Head: NCAT.  Right Ear: External ear normal.  Left Ear: External ear normal.  Eyes: . Pupils are equal, round, and reactive to light. Conjunctivae and EOM are normal Nose: without d/c or deformity Neck: Neck supple. Gross normal ROM Cardiovascular: Normal rate and regular rhythm.   Pulmonary/Chest: Effort normal and breath sounds without rales or wheezing.  Abd:  Soft, NT, ND, + BS, no organomegaly Left wrist with 1-2+ tender warm swelling with reduced ROM Neurological: Pt is alert. At baseline orientation, motor grossly intact Skin: Skin is warm. No rashes, other new lesions, no LE edema Psychiatric: Pt behavior is normal without agitation  No other exam findings  Lab Results  Component Value Date   WBC 5.8 07/22/2018   HGB 11.0 (L) 07/22/2018   HCT 35.7 (L) 07/22/2018   PLT 276 07/22/2018   GLUCOSE 126 (H) 07/22/2018   CHOL 231 (H) 05/14/2018   TRIG 172.0 (H) 05/14/2018   HDL 77.80 05/14/2018   LDLDIRECT 136.0 07/16/2014   LDLCALC 119 (H) 05/14/2018   ALT 158 (H) 07/17/2018   AST 83 (H) 07/17/2018   NA 136 07/22/2018   K 4.0 07/22/2018   CL 99 07/22/2018   CREATININE 10.37 (H) 07/22/2018   BUN 37 (H) 07/22/2018   CO2 22 07/22/2018   TSH 0.19 (L) 05/14/2018   PSA 3.67 08/30/2014   INR 1.12 07/16/2018   HGBA1C 5.7 (H) 07/16/2018       Assessment & Plan:

## 2018-08-08 NOTE — Assessment & Plan Note (Signed)
Mod to severe , left wrist only, for toradol 30 mg IM, depomedrol IM 80, colchicine daily prn after

## 2018-08-09 DIAGNOSIS — R509 Fever, unspecified: Secondary | ICD-10-CM | POA: Diagnosis not present

## 2018-08-09 DIAGNOSIS — D509 Iron deficiency anemia, unspecified: Secondary | ICD-10-CM | POA: Diagnosis not present

## 2018-08-09 DIAGNOSIS — N2581 Secondary hyperparathyroidism of renal origin: Secondary | ICD-10-CM | POA: Diagnosis not present

## 2018-08-09 DIAGNOSIS — D631 Anemia in chronic kidney disease: Secondary | ICD-10-CM | POA: Diagnosis not present

## 2018-08-09 DIAGNOSIS — N186 End stage renal disease: Secondary | ICD-10-CM | POA: Diagnosis not present

## 2018-08-09 NOTE — Assessment & Plan Note (Signed)
stable overall by history and exam, recent data reviewed with pt, and pt to continue medical treatment as before,  to f/u any worsening symptoms or concerns  

## 2018-08-11 DIAGNOSIS — I132 Hypertensive heart and chronic kidney disease with heart failure and with stage 5 chronic kidney disease, or end stage renal disease: Secondary | ICD-10-CM | POA: Diagnosis not present

## 2018-08-11 DIAGNOSIS — I5042 Chronic combined systolic (congestive) and diastolic (congestive) heart failure: Secondary | ICD-10-CM | POA: Diagnosis not present

## 2018-08-11 DIAGNOSIS — D631 Anemia in chronic kidney disease: Secondary | ICD-10-CM | POA: Diagnosis not present

## 2018-08-11 DIAGNOSIS — N186 End stage renal disease: Secondary | ICD-10-CM | POA: Diagnosis not present

## 2018-08-11 DIAGNOSIS — J449 Chronic obstructive pulmonary disease, unspecified: Secondary | ICD-10-CM | POA: Diagnosis not present

## 2018-08-11 DIAGNOSIS — E1122 Type 2 diabetes mellitus with diabetic chronic kidney disease: Secondary | ICD-10-CM | POA: Diagnosis not present

## 2018-08-12 DIAGNOSIS — D509 Iron deficiency anemia, unspecified: Secondary | ICD-10-CM | POA: Diagnosis not present

## 2018-08-12 DIAGNOSIS — R509 Fever, unspecified: Secondary | ICD-10-CM | POA: Diagnosis not present

## 2018-08-12 DIAGNOSIS — N186 End stage renal disease: Secondary | ICD-10-CM | POA: Diagnosis not present

## 2018-08-12 DIAGNOSIS — D631 Anemia in chronic kidney disease: Secondary | ICD-10-CM | POA: Diagnosis not present

## 2018-08-12 DIAGNOSIS — I5042 Chronic combined systolic (congestive) and diastolic (congestive) heart failure: Secondary | ICD-10-CM | POA: Diagnosis not present

## 2018-08-12 DIAGNOSIS — I132 Hypertensive heart and chronic kidney disease with heart failure and with stage 5 chronic kidney disease, or end stage renal disease: Secondary | ICD-10-CM | POA: Diagnosis not present

## 2018-08-12 DIAGNOSIS — E1122 Type 2 diabetes mellitus with diabetic chronic kidney disease: Secondary | ICD-10-CM | POA: Diagnosis not present

## 2018-08-12 DIAGNOSIS — J449 Chronic obstructive pulmonary disease, unspecified: Secondary | ICD-10-CM | POA: Diagnosis not present

## 2018-08-12 DIAGNOSIS — N2581 Secondary hyperparathyroidism of renal origin: Secondary | ICD-10-CM | POA: Diagnosis not present

## 2018-08-13 ENCOUNTER — Ambulatory Visit (HOSPITAL_COMMUNITY)
Admission: RE | Admit: 2018-08-13 | Discharge: 2018-08-13 | Disposition: A | Discharge status: Home / Self Care | Payer: Medicare Other | Source: Ambulatory Visit | Attending: Internal Medicine | Admitting: Internal Medicine

## 2018-08-13 ENCOUNTER — Ambulatory Visit (HOSPITAL_BASED_OUTPATIENT_CLINIC_OR_DEPARTMENT_OTHER)
Admission: RE | Admit: 2018-08-13 | Discharge: 2018-08-13 | Disposition: A | Discharge status: Home / Self Care | Payer: Medicare Other | Source: Ambulatory Visit | Attending: Internal Medicine | Admitting: Internal Medicine

## 2018-08-13 ENCOUNTER — Encounter (HOSPITAL_COMMUNITY): Payer: Self-pay | Admitting: Internal Medicine

## 2018-08-13 ENCOUNTER — Other Ambulatory Visit: Payer: Self-pay

## 2018-08-13 VITALS — BP 132/86 | HR 79 | Wt 220.2 lb

## 2018-08-13 DIAGNOSIS — N186 End stage renal disease: Secondary | ICD-10-CM

## 2018-08-13 DIAGNOSIS — I2510 Atherosclerotic heart disease of native coronary artery without angina pectoris: Secondary | ICD-10-CM | POA: Insufficient documentation

## 2018-08-13 DIAGNOSIS — I5032 Chronic diastolic (congestive) heart failure: Secondary | ICD-10-CM | POA: Diagnosis not present

## 2018-08-13 DIAGNOSIS — I5042 Chronic combined systolic (congestive) and diastolic (congestive) heart failure: Secondary | ICD-10-CM | POA: Insufficient documentation

## 2018-08-13 DIAGNOSIS — E785 Hyperlipidemia, unspecified: Secondary | ICD-10-CM | POA: Insufficient documentation

## 2018-08-13 DIAGNOSIS — L603 Nail dystrophy: Secondary | ICD-10-CM | POA: Diagnosis not present

## 2018-08-13 NOTE — Patient Instructions (Signed)
Your physician is referring you to Rheumatology.  They will call you to arrange an appointment.   Your physician recommends that you schedule a follow-up appointment in: 9 months. Please call in June to schedule your appointment.

## 2018-08-13 NOTE — Progress Notes (Signed)
  Echocardiogram 2D Echocardiogram has been performed.  [name] F 08/13/2018, 3:01 PM

## 2018-08-13 NOTE — Progress Notes (Signed)
Advanced Heart Failure Clinic Note   Patient ID: [name], male   DOB: 07/06/1951, 67 y.o.   MRN: [id number]  PCP: Dr [name] Nephrologist: Dr Mercy Moore HF: Dr. [name]   HPI: [name] is a 67 y.o. male with PMH: obesity, DM, COPD on nighttime oxygen, sleep apnea on CPAP, ESRD- HD, CAD, S/P Taxus drug-eluting stent to the right coronary in September 8144, chronic systolic/diastolic heart failure (Unable to tolerate statins so placed on Welchol), prostate CA s/p XRT   Evaluated at [facility] for kidney transplant but he was not felt to be candidate (11/2012) due to poor mobility, DM, and coronary disease.    In 11/17 had NSTEMI underwent PCI LAD. RCA had long 80-90% but felt to be not amenable to PCI due to tortuosity. Admitted in 1/18 with SVT and small NSTEMI. Cath films reviewed again with Dr. Martinique and RCA not approachable percutaneously.   Admitted 12/27/16 after transient chest pain and troponin came back mildly elevated in office. Troponin normal in ED here and thought stable for home. Pt thought he actually may have had a panic attack.   Admitted 10/18 with rectal bleeding felt due to severe radiation proctitis with ulcer on flex sig. Plavix stopped. Remains on ASA 81.   Admitted 01/15/18 with sharp chest pain and left-sided chest pressure after dialysis. Trop negative. Scheduled for Myoview which was unremarkable/low risk as below.   Admitted 07/2018 with sepsis secondary to klebsiella pneumonia. Treated with IV abx. Volume managed with HD.  He presents today for regular follow up. He has several concerns today. Having pain in left wrist and right shoulder. Got an injection by PCP and ordered for colchicine (has not picked up yet because it is >$100). From HF perspective, doing okay. He gets SOB after walking short distances on flat ground. Denies edema, orthopnea, or PND. Decreased appetite and energy level. Denies dizziness. He had an episode of mid sternal CP that he attributes  to cramping after HD. It lasted 15-20 minutes. No associated symptoms. Occurred at rest. Wearing CPAP qHS. Followed by [facility] PT and RN. On HD T/R/Sa. Weight down 4 lbs on our scale. SBP as low as 80 at home. Feels weak, but not dizzy. Taking all medications. No longer on midodrine per nephrology. because BP was running high.   Myoview 01/22/18: EF 44%, No ischemia or infarction.   Echo 12/12: EF 55% Myoview 10/15/12: EF 33%. LV Wall Motion: There is global hypokinesis. The LV is markedly enlarged. Small fixed apical defect.  cMRI 02/19/13: EF 37% dilated LV. diffuse HK Echo 6/15: EF 25-30%  Echo 4/16: EF 50-55% Echo 10/17/16 LVEF 40-45%, Grade 2 DD. Trivial MR, Mild LAE, Normal RV. Echo 08/13/18: EF 55-60%.  Review of systems complete and found to be negative unless listed in HPI.   Past Medical History:  Diagnosis Date  . Allergic rhinitis, cause unspecified 02/24/2014  . Anemia 06/16/2011  . BENIGN PROSTATIC HYPERTROPHY 10/14/2009  . CAD, NATIVE VESSEL 02/06/2009   a. 06/2007 s/p Taxus DES to the RCA;  b. 08/2016 NSTEMI in setting of SVT/PCI: LM 30ost, LAD 51m (3.0x16 Synergy DES), LCX 74m, OM1 60, RI 40, RCA 70p/m, 27m - not amenable to PCI.  [name] Cervical radiculopathy, chronic 02/23/2016   Right c5-6 by NCS/EMG  . Chronic combined systolic (congestive) and diastolic (congestive) heart failure ([place])    a. 10/2016 Echo: EF 40-45%, Gr2 DD. mildly dil LA.  [name] COLONIC POLYPS, HX OF 10/14/2009  . Dementia ([place])  [id number]  . Depression 09/24/2015  . DIABETES MELLITUS, TYPE II 02/01/2010  . DYSLIPIDEMIA 06/18/2007  . ESRD (end stage renal disease) on dialysis ([place]) 08/04/2010   ESRD due to DM adn HTN, started HD in 2011. As of 2019 has a R arm graft and gets HD on TTS sched  . FOOT PAIN 08/12/2008  . GAIT DISTURBANCE 03/03/2010  . GASTROENTERITIS, VIRAL 10/14/2009  . GERD 06/18/2007  . GOITER, MULTINODULAR 12/26/2007  . GOUT 06/18/2007  . GYNECOMASTIA 07/17/2010  . Hemodialysis access, fistula  [facility])    Dialysis T-Th-Sa ([place]) Right upper arm fistula  . Hyperlipidemia 10/16/2011  . Hyperparathyroidism, secondary ([place]) 06/16/2011  . HYPERTENSION 06/18/2007  . Hyperthyroidism   . Hypocalcemia 06/07/2010  . Ischemic cardiomyopathy    a. 10/2016 Echo: EF 40-45%.  . Lumbar stenosis with neurogenic claudication   . ONYCHOMYCOSIS, TOENAILS 12/26/2007  . OSA on CPAP 10/16/2011  . Other malaise and fatigue 11/24/2009  . PERIPHERAL NEUROPATHY 06/18/2007  . Prostate cancer ([place])   . PSVT (paroxysmal supraventricular tachycardia) ([place])    a. 06/3817 complicated by NSTEMI;  b. 11/2016 Treated w/ adenosine in ED;  c. 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  [name] PULMONARY NODULE, RIGHT LOWER LOBE 06/08/2009  . Sleep apnea    cpap machine and o2  . TRANSAMINASES, SERUM, ELEVATED 02/01/2010  . Transfusion history    none recent  . Unspecified hypotension 01/30/2010   Current Outpatient Medications  Medication Sig Dispense Refill  . acetaminophen (TYLENOL) 325 MG tablet Take 650 mg by mouth every 6 (six) hours as needed for mild pain or headache.     . Alirocumab (PRALUENT) 150 MG/ML SOPN Inject 150 mg into the skin every 14 (fourteen) days. 2 pen 12  . allopurinol (ZYLOPRIM) 100 MG tablet TAKE ONE TABLET BY MOUTH ONCE DAILY 90 tablet 3  . aspirin EC 81 MG tablet Take 81 mg by mouth daily.    . betamethasone dipropionate (DIPROLENE) 0.05 % cream Apply 1 application topically daily as needed (leg sores).    . citalopram (CELEXA) 10 MG tablet Take 1 tablet (10 mg total) by mouth at bedtime. 90 tablet 3  . doxercalciferol ([name]) 4 MCG/2ML injection Inject 2 mLs (4 mcg total) into the vein Every Tuesday,Thursday,and Saturday with dialysis. 2 mL   . ezetimibe (ZETIA) 10 MG tablet TAKE ONE TABLET BY MOUTH ONCE DAILY 90 tablet 3  . ferric citrate (AURYXIA) 1 GM 210 MG(Fe) tablet Take 210-420 mg by mouth See admin instructions. Take 420 mg by mouth two times a day and 210 mg with each snack      . gabapentin (NEURONTIN) 300 MG capsule Take 1 capsule (300 mg total) by mouth at bedtime. (Patient taking differently: Take 600 mg by mouth at bedtime. )    . hydroxypropyl methylcellulose / hypromellose (ISOPTO TEARS / GONIOVISC) 2.5 % ophthalmic solution Place 1 drop into both eyes 3 (three) times daily as needed for dry eyes. 15 mL 11  . methimazole (TAPAZOLE) 5 MG tablet Take 0.5 tablets (2.5 mg total) by mouth daily. 45 tablet 3  . multivitamin (RENA-VIT) TABS tablet Take 1 tablet by mouth daily. Reported on 10/24/2015    . pantoprazole (PROTONIX) 40 MG tablet Take 40 mg by mouth every evening.     . polyethylene glycol (MIRALAX / GLYCOLAX) packet Take 17 g by mouth daily. (Patient taking differently: Take 17 g by mouth daily as needed for mild constipation. ) 14 each 0  .  SENSIPAR 90 MG tablet Take 90 mg by mouth 2 (two) times daily.    Earnestine Mealing 625 MG tablet TAKE THREE TABLETS BY MOUTH TWICE DAILY WITH MEALS 180 tablet 11  . zolpidem (AMBIEN) 5 MG tablet Take 1 tablet (5 mg total) by mouth at bedtime as needed. for sleep 90 tablet 1  . colchicine 0.6 MG tablet Take 1 tablet (0.6 mg total) by mouth daily. (Patient not taking: Reported on 08/13/2018) 90 tablet 3   No current facility-administered medications for this encounter.    Vitals:   08/13/18 1525  BP: 132/86  Pulse: 79  SpO2: 92%   Wt Readings from Last 3 Encounters:  08/13/18 99.9 kg (220 lb 3.2 oz)  08/08/18 98.4 kg (217 lb)  07/22/18 98.2 kg (216 lb 7.9 oz)    PHYSICAL EXAM: General: Arrived in wheelchair. No resp difficulty. Appears fatigue.  HEENT: Normal Neck: Supple. JVP flat. Carotids 2+ bilat; no bruits. No thyromegaly or nodule noted. Cor: PMI nondisplaced. RRR, No M/G/R noted Lungs: CTAB, normal effort. Abdomen: Soft, non-tender, non-distended, no HSM. No bruits or masses. +BS  Extremities: No cyanosis, clubbing, or rash. R and LLE no edema.  Neuro: Alert & orientedx3, cranial nerves grossly intact. moves  all 4 extremities w/o difficulty. Affect pleasant   ASSESSMENT & PLAN:  1. Chronic Systolic Heart Failure:  Mixed ICM/NICM, EF 37% per c-MRI. EF 25-30% (Echo 6/15)  - Most recent EF 40-45% in 1/18. EF 44% on myoview 12/2017 - NYHA III, confounded by fatigue and deconditioning.  - Volume status stable. Managed with HD. - Off ACE/ARB/ARNI due to ESRD.  - Now off BB with hypotension.  - Echo today EF 55%. 2. CAD - s/p PCI of LAD 11/17.  - Recurrent NSTEMI in 1/18 due to SVT. Cath films reviewed has 80-90% long lesion in RCA but not amenable to PCI due to tortuosity - Myoview 01/22/18: EF 44%, No ischemia or infarction.  - He has CP at rest sometimes after HD. Lasts 15-20 minutes. No associated symptoms.  - Continue ASA 81 mg daily. Has not been able to tolerate statins due to myositis. Continue Zetia and Welchol.  3. Hypotension - Nephrology has stopped midodrine due to high BP 4. ESRD - HD Tues/Thur/Sat. No change.  - Recently underwent balloon angioplasty of RU fistula with Dr Donzetta Matters 5. PSVT - Resolved s/p ablation for AVNRT on 11/26/16 with resolution of symptoms.  - No change.  6. Deconditioning/fatigue - Multifactorial due to lack of activity and long-term HD.  - Wearing CPAP qHS - Continue HH PT with [facility] 7. Hyperthyroidism - Continue methimazole 8. Gouty arthritis - Refer to rheumatology - Recommended calling Dr [name] to switch from colchicine because it's expensive.    Georgiana Shore, NP  3:52 PM  Patient seen and examined with the above-signed Advanced Practice Provider and/or Housestaff. I personally reviewed laboratory data, imaging studies and relevant notes. I independently examined the patient and formulated the important aspects of the plan. I have edited the note to reflect any of my changes or salient points. I have personally discussed the plan with the patient and/or family.  Overall doing well from cardiac perspective. Echo today reviewed personally EF 50-55%.  No ischemic symptoms. Volume status well managed with HD. Seems to have recurrent gout but can't afford colchicine and allopurinol not working well. Will refer to Rheum. Encouraged him to try and be more active. Continue CPAP.   Glori Bickers, MD  7:15 PM

## 2018-08-14 ENCOUNTER — Telehealth (HOSPITAL_COMMUNITY): Payer: Self-pay

## 2018-08-14 DIAGNOSIS — N2581 Secondary hyperparathyroidism of renal origin: Secondary | ICD-10-CM | POA: Diagnosis not present

## 2018-08-14 DIAGNOSIS — D631 Anemia in chronic kidney disease: Secondary | ICD-10-CM | POA: Diagnosis not present

## 2018-08-14 DIAGNOSIS — D509 Iron deficiency anemia, unspecified: Secondary | ICD-10-CM | POA: Diagnosis not present

## 2018-08-14 DIAGNOSIS — R509 Fever, unspecified: Secondary | ICD-10-CM | POA: Diagnosis not present

## 2018-08-14 DIAGNOSIS — N186 End stage renal disease: Secondary | ICD-10-CM | POA: Diagnosis not present

## 2018-08-14 NOTE — Telephone Encounter (Signed)
Referral faxed to [facility].  ?

## 2018-08-15 ENCOUNTER — Other Ambulatory Visit: Payer: Self-pay | Admitting: *Deleted

## 2018-08-15 DIAGNOSIS — D631 Anemia in chronic kidney disease: Secondary | ICD-10-CM | POA: Diagnosis not present

## 2018-08-15 DIAGNOSIS — C61 Malignant neoplasm of prostate: Secondary | ICD-10-CM | POA: Diagnosis not present

## 2018-08-15 DIAGNOSIS — I5042 Chronic combined systolic (congestive) and diastolic (congestive) heart failure: Secondary | ICD-10-CM | POA: Diagnosis not present

## 2018-08-15 DIAGNOSIS — E1122 Type 2 diabetes mellitus with diabetic chronic kidney disease: Secondary | ICD-10-CM | POA: Diagnosis not present

## 2018-08-15 DIAGNOSIS — N186 End stage renal disease: Secondary | ICD-10-CM | POA: Diagnosis not present

## 2018-08-15 DIAGNOSIS — J449 Chronic obstructive pulmonary disease, unspecified: Secondary | ICD-10-CM | POA: Diagnosis not present

## 2018-08-15 DIAGNOSIS — I132 Hypertensive heart and chronic kidney disease with heart failure and with stage 5 chronic kidney disease, or end stage renal disease: Secondary | ICD-10-CM | POA: Diagnosis not present

## 2018-08-15 NOTE — Patient Outreach (Signed)
[facility]) Care Management  08/15/2018  DIYAN DAVE June 05, 1951 [id number]   Call placed to member's wife to follow up on visits with cardiology and primary MD last week.  Report visits went well, however he is still experiencing discomfort/pain in his wrists.  Referral was placed to rheumatology for further assessment.  She also state member was told that he needed to increase his activity/mobility in effort to prevent being completely wheelchair bound in the future.  He is active with home health for PT/OT.    During discussion with wife about scheduling home visit, she state how overwhelmed she is, stating "I feel like I could burst into tears."  Member has dialysis 3 days a week in addition to other MD appointments.  She also her own MD appointments as well as transporting her son to his appointments.  She admits she does not have enough support in the home and didn't realize she was so overwhelmed until conversation today.  She agrees to referral to Education officer, museum for assistance/resources. Agrees to home visit next week for further evaluation of needs.  [name], [place], MSN [place] [phone]

## 2018-08-16 DIAGNOSIS — N186 End stage renal disease: Secondary | ICD-10-CM | POA: Diagnosis not present

## 2018-08-16 DIAGNOSIS — R509 Fever, unspecified: Secondary | ICD-10-CM | POA: Diagnosis not present

## 2018-08-16 DIAGNOSIS — N2581 Secondary hyperparathyroidism of renal origin: Secondary | ICD-10-CM | POA: Diagnosis not present

## 2018-08-16 DIAGNOSIS — D509 Iron deficiency anemia, unspecified: Secondary | ICD-10-CM | POA: Diagnosis not present

## 2018-08-16 DIAGNOSIS — D631 Anemia in chronic kidney disease: Secondary | ICD-10-CM | POA: Diagnosis not present

## 2018-08-17 DIAGNOSIS — R1310 Dysphagia, unspecified: Secondary | ICD-10-CM | POA: Diagnosis not present

## 2018-08-17 DIAGNOSIS — E1142 Type 2 diabetes mellitus with diabetic polyneuropathy: Secondary | ICD-10-CM | POA: Diagnosis not present

## 2018-08-17 DIAGNOSIS — M48062 Spinal stenosis, lumbar region with neurogenic claudication: Secondary | ICD-10-CM | POA: Diagnosis not present

## 2018-08-17 DIAGNOSIS — I252 Old myocardial infarction: Secondary | ICD-10-CM | POA: Diagnosis not present

## 2018-08-17 DIAGNOSIS — I5082 Biventricular heart failure: Secondary | ICD-10-CM | POA: Diagnosis not present

## 2018-08-17 DIAGNOSIS — N400 Enlarged prostate without lower urinary tract symptoms: Secondary | ICD-10-CM | POA: Diagnosis not present

## 2018-08-17 DIAGNOSIS — E1122 Type 2 diabetes mellitus with diabetic chronic kidney disease: Secondary | ICD-10-CM | POA: Diagnosis not present

## 2018-08-17 DIAGNOSIS — Z8546 Personal history of malignant neoplasm of prostate: Secondary | ICD-10-CM | POA: Diagnosis not present

## 2018-08-17 DIAGNOSIS — J449 Chronic obstructive pulmonary disease, unspecified: Secondary | ICD-10-CM | POA: Diagnosis not present

## 2018-08-17 DIAGNOSIS — E1151 Type 2 diabetes mellitus with diabetic peripheral angiopathy without gangrene: Secondary | ICD-10-CM | POA: Diagnosis not present

## 2018-08-17 DIAGNOSIS — I5042 Chronic combined systolic (congestive) and diastolic (congestive) heart failure: Secondary | ICD-10-CM | POA: Diagnosis not present

## 2018-08-17 DIAGNOSIS — F0280 Dementia in other diseases classified elsewhere without behavioral disturbance: Secondary | ICD-10-CM | POA: Diagnosis not present

## 2018-08-17 DIAGNOSIS — D631 Anemia in chronic kidney disease: Secondary | ICD-10-CM | POA: Diagnosis not present

## 2018-08-17 DIAGNOSIS — Z96659 Presence of unspecified artificial knee joint: Secondary | ICD-10-CM | POA: Diagnosis not present

## 2018-08-17 DIAGNOSIS — E039 Hypothyroidism, unspecified: Secondary | ICD-10-CM | POA: Diagnosis not present

## 2018-08-17 DIAGNOSIS — Z992 Dependence on renal dialysis: Secondary | ICD-10-CM | POA: Diagnosis not present

## 2018-08-17 DIAGNOSIS — I255 Ischemic cardiomyopathy: Secondary | ICD-10-CM | POA: Diagnosis not present

## 2018-08-17 DIAGNOSIS — I2510 Atherosclerotic heart disease of native coronary artery without angina pectoris: Secondary | ICD-10-CM | POA: Diagnosis not present

## 2018-08-17 DIAGNOSIS — N186 End stage renal disease: Secondary | ICD-10-CM | POA: Diagnosis not present

## 2018-08-17 DIAGNOSIS — Z9861 Coronary angioplasty status: Secondary | ICD-10-CM | POA: Diagnosis not present

## 2018-08-17 DIAGNOSIS — M109 Gout, unspecified: Secondary | ICD-10-CM | POA: Diagnosis not present

## 2018-08-17 DIAGNOSIS — I959 Hypotension, unspecified: Secondary | ICD-10-CM | POA: Diagnosis not present

## 2018-08-17 DIAGNOSIS — F329 Major depressive disorder, single episode, unspecified: Secondary | ICD-10-CM | POA: Diagnosis not present

## 2018-08-17 DIAGNOSIS — M5412 Radiculopathy, cervical region: Secondary | ICD-10-CM | POA: Diagnosis not present

## 2018-08-17 DIAGNOSIS — I132 Hypertensive heart and chronic kidney disease with heart failure and with stage 5 chronic kidney disease, or end stage renal disease: Secondary | ICD-10-CM | POA: Diagnosis not present

## 2018-08-18 ENCOUNTER — Telehealth: Payer: Self-pay | Admitting: Internal Medicine

## 2018-08-18 ENCOUNTER — Encounter: Payer: Self-pay | Admitting: Endocrinology

## 2018-08-18 ENCOUNTER — Other Ambulatory Visit: Payer: Self-pay | Admitting: Internal Medicine

## 2018-08-18 ENCOUNTER — Ambulatory Visit (INDEPENDENT_AMBULATORY_CARE_PROVIDER_SITE_OTHER): Payer: Medicare Other | Admitting: Endocrinology

## 2018-08-18 VITALS — BP 148/78 | HR 100 | Ht 73.0 in | Wt 220.6 lb

## 2018-08-18 DIAGNOSIS — D631 Anemia in chronic kidney disease: Secondary | ICD-10-CM | POA: Diagnosis not present

## 2018-08-18 DIAGNOSIS — I200 Unstable angina: Secondary | ICD-10-CM | POA: Diagnosis not present

## 2018-08-18 DIAGNOSIS — I5082 Biventricular heart failure: Secondary | ICD-10-CM | POA: Diagnosis not present

## 2018-08-18 DIAGNOSIS — N186 End stage renal disease: Secondary | ICD-10-CM | POA: Diagnosis not present

## 2018-08-18 DIAGNOSIS — I5042 Chronic combined systolic (congestive) and diastolic (congestive) heart failure: Secondary | ICD-10-CM | POA: Diagnosis not present

## 2018-08-18 DIAGNOSIS — I132 Hypertensive heart and chronic kidney disease with heart failure and with stage 5 chronic kidney disease, or end stage renal disease: Secondary | ICD-10-CM | POA: Diagnosis not present

## 2018-08-18 DIAGNOSIS — E0590 Thyrotoxicosis, unspecified without thyrotoxic crisis or storm: Secondary | ICD-10-CM

## 2018-08-18 DIAGNOSIS — E1122 Type 2 diabetes mellitus with diabetic chronic kidney disease: Secondary | ICD-10-CM | POA: Diagnosis not present

## 2018-08-18 LAB — T4, FREE: Free T4: 0.85 ng/dL (ref 0.60–1.60)

## 2018-08-18 LAB — TSH: TSH: 0.7 u[IU]/mL (ref 0.35–4.50)

## 2018-08-18 NOTE — Telephone Encounter (Signed)
Notified [name] w/MD response.Marland KitchenJohny Chess

## 2018-08-18 NOTE — Progress Notes (Signed)
Subjective:    Patient ID: [name], male    DOB: 12/26/1950, 67 y.o.   MRN: [id number]  HPI Pt has diabetes mellitus:  DM type: 2 Dx'ed: 7619 Complications: polyneuropathy, renal failure, and CAD.  Therapy: welchol DKA: never.  Severe hypoglycemia: never.  Pancreatitis: never.  Other: med needs have decreased with worsening of renal function.  Interval history: pt states cbg's are well-controlled.  There is no trend throughout the day.  He denies hypoglycemia.   Pt also has hyperthyroidism (due to a multinodular goiter; dx'ed 2009; bx then was benign; nuc med scan showed very low uptake, so tapazole was chosen as rx).  He takes tapazole as rx'ed Past Medical History:  Diagnosis Date  . Allergic rhinitis, cause unspecified 02/24/2014  . Anemia 06/16/2011  . BENIGN PROSTATIC HYPERTROPHY 10/14/2009  . CAD, NATIVE VESSEL 02/06/2009   a. 06/2007 s/p Taxus DES to the RCA;  b. 08/2016 NSTEMI in setting of SVT/PCI: LM 30ost, LAD 16m (3.0x16 Synergy DES), LCX 65m, OM1 60, RI 40, RCA 70p/m, 24m - not amenable to PCI.  [name] Cervical radiculopathy, chronic 02/23/2016   Right c5-6 by NCS/EMG  . Chronic combined systolic (congestive) and diastolic (congestive) heart failure ([place])    a. 10/2016 Echo: EF 40-45%, Gr2 DD. mildly dil LA.  [name] COLONIC POLYPS, HX OF 10/14/2009  . Dementia ([place]) [id number]  . Depression 09/24/2015  . DIABETES MELLITUS, TYPE II 02/01/2010  . DYSLIPIDEMIA 06/18/2007  . ESRD (end stage renal disease) on dialysis ([place]) 08/04/2010   ESRD due to DM adn HTN, started HD in 2011. As of 2019 has a R arm graft and gets HD on TTS sched  . FOOT PAIN 08/12/2008  . GAIT DISTURBANCE 03/03/2010  . GASTROENTERITIS, VIRAL 10/14/2009  . GERD 06/18/2007  . GOITER, MULTINODULAR 12/26/2007  . GOUT 06/18/2007  . GYNECOMASTIA 07/17/2010  . Hemodialysis access, fistula mature [facility])    Dialysis T-Th-Sa ([place]) Right upper arm fistula  . Hyperlipidemia 10/16/2011  .  Hyperparathyroidism, secondary ([place]) 06/16/2011  . HYPERTENSION 06/18/2007  . Hyperthyroidism   . Hypocalcemia 06/07/2010  . Ischemic cardiomyopathy    a. 10/2016 Echo: EF 40-45%.  . Lumbar stenosis with neurogenic claudication   . ONYCHOMYCOSIS, TOENAILS 12/26/2007  . OSA on CPAP 10/16/2011  . Other malaise and fatigue 11/24/2009  . PERIPHERAL NEUROPATHY 06/18/2007  . Prostate cancer ([place])   . PSVT (paroxysmal supraventricular tachycardia) ([place])    a. 45/8099 complicated by NSTEMI;  b. 11/2016 Treated w/ adenosine in ED;  c. 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  [name] PULMONARY NODULE, RIGHT LOWER LOBE 06/08/2009  . Sleep apnea    cpap machine and o2  . TRANSAMINASES, SERUM, ELEVATED 02/01/2010  . Transfusion history    none recent  . Unspecified hypotension 01/30/2010    Past Surgical History:  Procedure Laterality Date  . A/V SHUNTOGRAM N/A 04/07/2018   Procedure: A/V SHUNTOGRAM;  Surgeon: Waynetta Sandy, MD;  Location: [place] [facility];  Service: Cardiovascular;  Laterality: N/A;  . ARTERIOVENOUS GRAFT PLACEMENT Right 2009   forearm/notes 02/01/2011  . AV FISTULA PLACEMENT  11/07/2011   Procedure: INSERTION OF ARTERIOVENOUS (AV) GORE-TEX GRAFT ARM;  Surgeon: Tinnie Gens, MD;  Location: [place];  Service: Vascular;  Laterality: Left;  . BACK SURGERY  1998  . BASCILIC VEIN TRANSPOSITION Right 02/27/2013   Procedure: BASCILIC VEIN TRANSPOSITION;  Surgeon: Mal Misty, MD;  Location: [place];  Service: Vascular;  Laterality: Right;  Right  Basilic Vein Transposition   . CARDIAC CATHETERIZATION N/A 08/06/2016   Procedure: Left Heart Cath and Coronary Angiography;  Surgeon: Jolaine Artist, MD;  Location: [place] [facility];  Service: Cardiovascular;  Laterality: N/A;  . CARDIAC CATHETERIZATION N/A 08/07/2016   Procedure: Coronary/Graft Atherectomy-CSI LAD;  Surgeon: Peter M Martinique, MD;  Location: [place] [facility];  Service: Cardiovascular;  Laterality: N/A;  . CERVICAL SPINE SURGERY  2/09   "to  repair nerve problems in my left arm"  . CHOLECYSTECTOMY    . COLONOSCOPY WITH PROPOFOL N/A 04/26/2017   Procedure: COLONOSCOPY WITH PROPOFOL;  Surgeon: Otis Brace, MD;  Location: [place];  Service: Gastroenterology;  Laterality: N/A;  . CORONARY ANGIOPLASTY WITH STENT PLACEMENT  06/11/2008  . CORONARY ANGIOPLASTY WITH STENT PLACEMENT  06/2007   TAXUS stent to RCA/notes 01/31/2011  . ESOPHAGOGASTRODUODENOSCOPY  09/28/2011   Procedure: ESOPHAGOGASTRODUODENOSCOPY (EGD);  Surgeon: Missy Sabins, MD;  Location: [facility] ENDOSCOPY;  Service: Endoscopy;  Laterality: N/A;  . ESOPHAGOGASTRODUODENOSCOPY N/A 04/07/2015   Procedure: ESOPHAGOGASTRODUODENOSCOPY (EGD);  Surgeon: Teena Irani, MD;  Location: [facility];  Service: Endoscopy;  Laterality: N/A;  . ESOPHAGOGASTRODUODENOSCOPY N/A 04/19/2015   Procedure: ESOPHAGOGASTRODUODENOSCOPY (EGD);  Surgeon: Arta Silence, MD;  Location: [facility] ENDOSCOPY;  Service: Endoscopy;  Laterality: N/A;  . ESOPHAGOGASTRODUODENOSCOPY (EGD) WITH PROPOFOL N/A 06/13/2018   Procedure: ESOPHAGOGASTRODUODENOSCOPY (EGD) WITH PROPOFOL;  Surgeon: Wonda Horner, MD;  Location[facility] END[facility]COPY;  Service: Endoscopy;  Laterality: N/A;  . FLEXIBLE SIGMOIDOSCOPY N/A 05/21/2017   Procedure: Conni Elliot;  Surgeon: Clarene Essex, MD;  Location: [place];  Service: Endoscopy;  Laterality: N/A;  . FLEXIBLE SIGMOIDOSCOPY Left 07/02/2017   Procedure: FLEXIBLE SIGMOIDOSCOPY;  Surgeon: Laurence Spates, MD;  Location: [place];  Service: Endoscopy;  Laterality: Left;  . FOREIGN BODY REMOVAL  09/2003   via upper endoscopy/notes 02/12/2011  . GIVENS CAPSULE STUDY  09/30/2011   Procedure: GIVENS CAPSULE STUDY;  Surgeon: Jeryl Columbia, MD;  Location: [facility] ENDOSCOPY;  Service: Endoscopy;  Laterality: N/A;  . INSERTION OF DIALYSIS CATHETER Right 2014  . INSERTION OF DIALYSIS CATHETER Left 02/11/2013   Procedure: INSERTION OF DIALYSIS CATHETER;  Surgeon: Conrad [place]on[place], MD;  Location: [place];   Service: Vascular;  Laterality: Left;  Ultrasound guided  . LUMBAR LAMINECTOMY/DECOMPRESSION MICRODISCECTOMY Bilateral 07/31/2017   Procedure: LAMINECTOMY AND FORAMINOTOMY- BILATERAL LUMBAR TWO- LUMBAR THREE;  Surgeon: Earnie Larsson, MD;  Location: [place];  Service: Neurosurgery;  Laterality: Bilateral;  LAMINECTOMY AND FORAMINOTOMY- BILATERAL LUMBAR 2- LUMBAR 3  . PERIPHERAL VASCULAR BALLOON ANGIOPLASTY  04/07/2018   Procedure: PERIPHERAL VASCULAR BALLOON ANGIOPLASTY;  Surgeon: Waynetta Sandy, MD;  Location: [place] CV [facility];  Service: Cardiovascular;;  RUE AVF  . REMOVAL OF A DIALYSIS CATHETER Right 02/11/2013   Procedure: REMOVAL OF A DIALYSIS CATHETER;  Surgeon: Conrad Nolan, MD;  Location: [place];  Service: Vascular;  Laterality: Right;  . SAVORY DILATION N/A 04/07/2015   Procedure: SAVORY DILATION;  Surgeon: Teena Irani, MD;  Location: [facility] ENDOSCOPY;  Service: Endoscopy;  Laterality: N/A;  . SHUNTOGRAM N/A 09/20/2011   Procedure: Earney Mallet;  Surgeon: Conrad [place], MD;  Location: [facility] CATH LAB;  Service: Cardiovascular;  Laterality: N/A;  . SVT ABLATION N/A 11/26/2016   Procedure: SVT Ablation;  Surgeon: Will Meredith Leeds, MD;  Location: [place] CV LAB;  Service: Cardiovascular;  Laterality: N/A;  . TONSILLECTOMY    . TOTAL KNEE ARTHROPLASTY Right 08/02/2015   Procedure: TOTAL KNEE ARTHROPLASTY;  Surgeon: Renette Butters, MD;  Location: [place];  Service: Orthopedics;  Laterality: Right;  . VENOGRAM N/A 01/26/2013   Procedure: VENOGRAM;  Surgeon: Angelia Mould, MD;  Location: [facility] CATH LAB;  Service: Cardiovascular;  Laterality: N/A;    Social History   Socioeconomic History  . Marital status: Married    Spouse name: Not on file  . Number of children: 3  . Years of education: 86  . Highest education level: Not on file  Occupational History  . Occupation: disabled    Employer: DISABLED  . Occupation: formerly Lawyer for [employer].  Social Needs  . Financial  resource strain: Not on file  . Food insecurity:    Worry: Not on file    Inability: Not on file  . Transportation needs:    Medical: Not on file    Non-medical: Not on file  Tobacco Use  . Smoking status: Former Smoker    Packs/day: 1.00    Years: 25.00    Pack years: 25.00    Types: Cigarettes    Last attempt to quit: 10/01/2005    Years since quitting: 12.8  . Smokeless tobacco: Former Systems developer    Quit date: 10/01/2005  . Tobacco comment: Quit smoking 2007 Smoked x 25 years 1/2 ppd.  Substance and Sexual Activity  . Alcohol use: No    Alcohol/week: 0.0 standard drinks  . Drug use: No  . Sexual activity: Never  Lifestyle  . Physical activity:    Days per week: Not on file    Minutes per session: Not on file  . Stress: Not on file  Relationships  . Social connections:    Talks on phone: Not on file    Gets together: Not on file    Attends religious service: Not on file    Active member of club or organization: Not on file    Attends meetings of clubs or organizations: Not on file    Relationship status: Not on file  . Intimate partner violence:    Fear of current or ex partner: Not on file    Emotionally abused: Not on file    Physically abused: Not on file    Forced sexual activity: Not on file  Other Topics Concern  . Not on file  Social History Narrative   Lives with wife   Caffeine use: Tea sometimes   No coffee   Left handed     Current Outpatient Medications on File Prior to Visit  Medication Sig Dispense Refill  . acetaminophen (TYLENOL) 325 MG tablet Take 650 mg by mouth every 6 (six) hours as needed for mild pain or headache.     . Alirocumab (PRALUENT) 150 MG/ML SOPN Inject 150 mg into the skin every 14 (fourteen) days. 2 pen 12  . allopurinol (ZYLOPRIM) 100 MG tablet TAKE ONE TABLET BY MOUTH ONCE DAILY 90 tablet 3  . aspirin EC 81 MG tablet Take 81 mg by mouth daily.    . betamethasone dipropionate (DIPROLENE) 0.05 % cream Apply 1 application topically  daily as needed (leg sores).    . citalopram (CELEXA) 10 MG tablet Take 1 tablet (10 mg total) by mouth at bedtime. 90 tablet 3  . colchicine 0.6 MG tablet Take 1 tablet (0.6 mg total) by mouth daily. 90 tablet 3  . doxercalciferol ([name]) 4 MCG/2ML injection Inject 2 mLs (4 mcg total) into the vein Every Tuesday,Thursday,and Saturday with dialysis. 2 mL   . ezetimibe (ZETIA) 10 MG tablet TAKE ONE TABLET BY MOUTH ONCE  DAILY 90 tablet 3  . ferric citrate (AURYXIA) 1 GM 210 MG(Fe) tablet Take 210-420 mg by mouth See admin instructions. Take 420 mg by mouth two times a day and 210 mg with each snack    . gabapentin (NEURONTIN) 300 MG capsule Take 1 capsule (300 mg total) by mouth at bedtime. (Patient taking differently: Take 600 mg by mouth at bedtime. )    . hydroxypropyl methylcellulose / hypromellose (ISOPTO TEARS / GONIOVISC) 2.5 % ophthalmic solution Place 1 drop into both eyes 3 (three) times daily as needed for dry eyes. 15 mL 11  . methimazole (TAPAZOLE) 5 MG tablet Take 0.5 tablets (2.5 mg total) by mouth daily. 45 tablet 3  . multivitamin (RENA-VIT) TABS tablet Take 1 tablet by mouth daily. Reported on 10/24/2015    . pantoprazole (PROTONIX) 40 MG tablet Take 40 mg by mouth every evening.     . polyethylene glycol (MIRALAX / GLYCOLAX) packet Take 17 g by mouth daily. (Patient taking differently: Take 17 g by mouth daily as needed for mild constipation. ) 14 each 0  . SENSIPAR 90 MG tablet Take 90 mg by mouth 2 (two) times daily.    Earnestine Mealing 625 MG tablet TAKE THREE TABLETS BY MOUTH TWICE DAILY WITH MEALS 180 tablet 11  . zolpidem (AMBIEN) 5 MG tablet Take 1 tablet (5 mg total) by mouth at bedtime as needed. for sleep 90 tablet 1   No current facility-administered medications on file prior to visit.     Allergies  Allergen Reactions  . Cephalexin Swelling and Other (See Comments)    Tolerated Rocephin 10/19. Tongue swelling, but no breathing issues Tolerated Zosyn in Sept 2019  without any issues  . Statins Other (See Comments)    Weak muscles  . Ciprofloxacin Rash    Family History  Problem Relation Age of Onset  . Heart disease Sister   . Thyroid nodules Sister   . Heart disease Father   . Diabetes Father   . Kidney failure Father   . Hypertension Father   . Healthy Child   . Healthy Child   . Healthy Child   . Cancer Neg Hx     BP (!) 148/78 (BP Location: Left Arm, Patient Position: Sitting, Cuff Size: Large)   Pulse 100   Ht 6\' 1"  (1.854 m)   Wt 220 lb 9.6 oz (100.1 kg)   SpO2 91%   BMI 29.10 kg/m    Review of Systems He has fatigue    Objective:   Physical Exam VITAL SIGNS:  See vs page GENERAL: no distress Pulses: dorsalis pedis intact bilat.   MSK: no deformity of the feet CV: no leg edema Skin:  no ulcer on the feet.  normal color and temp on the feet. Neuro: sensation is intact to touch on the feet.  Ext: There is bilateral onychomycosis of the toenails.     Lab Results  Component Value Date   HGBA1C 5.7 (H) 07/16/2018    Lab Results  Component Value Date   LABURIC 4.3 08/08/2018       Assessment & Plan:  Type 2 DM, with CAD: well-controlled Renal failure: this limits rx options.   Hyperthyroidism: recheck today.   Patient Instructions  Please continue the same colvesalam. blood tests are requested for you today.  We'll let you know about the results. If ever you have fever while taking methimazole, stop it and call us, even if the reason is obvious, because of the risk  of a rare side-effect.  Your uric acid was 4.3 on 08/08/18. Please come back for a follow-up appointment in 3 months.

## 2018-08-18 NOTE — Telephone Encounter (Signed)
Copied from [place] [id number]. Topic: Quick Communication - Home Health Verbal Orders >> Aug 18, 2018  8:53 AM Blase Mess A wrote: Caller/Agency: Genn[facility] Callback Number: [phone] to leave verbal on VM Requesting OT/PT/Skilled Nursing/Social Work: Extension of Orders-PT  Frequency: 2x a week for 2 weeks

## 2018-08-18 NOTE — Telephone Encounter (Signed)
Ok for verbals 

## 2018-08-18 NOTE — Patient Instructions (Addendum)
Please continue the same colvesalam. blood tests are requested for you today.  We'll let you know about the results. If ever you have fever while taking methimazole, stop it and call us, even if the reason is obvious, because of the risk of a rare side-effect.  Your uric acid was 4.3 on 08/08/18. Please come back for a follow-up appointment in 3 months.

## 2018-08-19 ENCOUNTER — Emergency Department (HOSPITAL_COMMUNITY): Payer: Medicare Other

## 2018-08-19 ENCOUNTER — Encounter (HOSPITAL_COMMUNITY): Payer: Self-pay

## 2018-08-19 ENCOUNTER — Other Ambulatory Visit: Payer: Self-pay

## 2018-08-19 ENCOUNTER — Inpatient Hospital Stay (HOSPITAL_COMMUNITY)
Admission: EM | Admit: 2018-08-19 | Discharge: 2018-08-31 | DRG: 871 | Disposition: A | Discharge status: Home Health | Payer: Medicare Other | Attending: Internal Medicine | Admitting: Internal Medicine

## 2018-08-19 DIAGNOSIS — A499 Bacterial infection, unspecified: Secondary | ICD-10-CM | POA: Diagnosis not present

## 2018-08-19 DIAGNOSIS — Z955 Presence of coronary angioplasty implant and graft: Secondary | ICD-10-CM | POA: Diagnosis not present

## 2018-08-19 DIAGNOSIS — A4159 Other Gram-negative sepsis: Principal | ICD-10-CM | POA: Diagnosis present

## 2018-08-19 DIAGNOSIS — R6520 Severe sepsis without septic shock: Secondary | ICD-10-CM | POA: Diagnosis not present

## 2018-08-19 DIAGNOSIS — G9340 Encephalopathy, unspecified: Secondary | ICD-10-CM | POA: Diagnosis present

## 2018-08-19 DIAGNOSIS — R109 Unspecified abdominal pain: Secondary | ICD-10-CM | POA: Diagnosis not present

## 2018-08-19 DIAGNOSIS — R404 Transient alteration of awareness: Secondary | ICD-10-CM | POA: Diagnosis not present

## 2018-08-19 DIAGNOSIS — A0471 Enterocolitis due to Clostridium difficile, recurrent: Secondary | ICD-10-CM | POA: Diagnosis not present

## 2018-08-19 DIAGNOSIS — Z881 Allergy status to other antibiotic agents status: Secondary | ICD-10-CM

## 2018-08-19 DIAGNOSIS — E1121 Type 2 diabetes mellitus with diabetic nephropathy: Secondary | ICD-10-CM | POA: Diagnosis not present

## 2018-08-19 DIAGNOSIS — F05 Delirium due to known physiological condition: Secondary | ICD-10-CM | POA: Diagnosis present

## 2018-08-19 DIAGNOSIS — I132 Hypertensive heart and chronic kidney disease with heart failure and with stage 5 chronic kidney disease, or end stage renal disease: Secondary | ICD-10-CM | POA: Diagnosis present

## 2018-08-19 DIAGNOSIS — N186 End stage renal disease: Secondary | ICD-10-CM

## 2018-08-19 DIAGNOSIS — D631 Anemia in chronic kidney disease: Secondary | ICD-10-CM | POA: Diagnosis present

## 2018-08-19 DIAGNOSIS — R4020 Unspecified coma: Secondary | ICD-10-CM | POA: Diagnosis not present

## 2018-08-19 DIAGNOSIS — A0472 Enterocolitis due to Clostridium difficile, not specified as recurrent: Secondary | ICD-10-CM | POA: Diagnosis not present

## 2018-08-19 DIAGNOSIS — B961 Klebsiella pneumoniae [K. pneumoniae] as the cause of diseases classified elsewhere: Secondary | ICD-10-CM | POA: Diagnosis present

## 2018-08-19 DIAGNOSIS — N2581 Secondary hyperparathyroidism of renal origin: Secondary | ICD-10-CM | POA: Diagnosis present

## 2018-08-19 DIAGNOSIS — I255 Ischemic cardiomyopathy: Secondary | ICD-10-CM | POA: Diagnosis present

## 2018-08-19 DIAGNOSIS — R4182 Altered mental status, unspecified: Secondary | ICD-10-CM

## 2018-08-19 DIAGNOSIS — E1129 Type 2 diabetes mellitus with other diabetic kidney complication: Secondary | ICD-10-CM

## 2018-08-19 DIAGNOSIS — L299 Pruritus, unspecified: Secondary | ICD-10-CM | POA: Diagnosis not present

## 2018-08-19 DIAGNOSIS — I2510 Atherosclerotic heart disease of native coronary artery without angina pectoris: Secondary | ICD-10-CM | POA: Diagnosis present

## 2018-08-19 DIAGNOSIS — Z96651 Presence of right artificial knee joint: Secondary | ICD-10-CM | POA: Diagnosis present

## 2018-08-19 DIAGNOSIS — D509 Iron deficiency anemia, unspecified: Secondary | ICD-10-CM | POA: Diagnosis not present

## 2018-08-19 DIAGNOSIS — R509 Fever, unspecified: Secondary | ICD-10-CM | POA: Diagnosis not present

## 2018-08-19 DIAGNOSIS — G2581 Restless legs syndrome: Secondary | ICD-10-CM | POA: Diagnosis present

## 2018-08-19 DIAGNOSIS — Z981 Arthrodesis status: Secondary | ICD-10-CM

## 2018-08-19 DIAGNOSIS — R000 Tachycardia, unspecified: Secondary | ICD-10-CM | POA: Diagnosis not present

## 2018-08-19 DIAGNOSIS — J9811 Atelectasis: Secondary | ICD-10-CM | POA: Diagnosis not present

## 2018-08-19 DIAGNOSIS — K627 Radiation proctitis: Secondary | ICD-10-CM | POA: Diagnosis present

## 2018-08-19 DIAGNOSIS — R6521 Severe sepsis with septic shock: Secondary | ICD-10-CM | POA: Diagnosis not present

## 2018-08-19 DIAGNOSIS — Z992 Dependence on renal dialysis: Secondary | ICD-10-CM | POA: Diagnosis not present

## 2018-08-19 DIAGNOSIS — A419 Sepsis, unspecified organism: Secondary | ICD-10-CM

## 2018-08-19 DIAGNOSIS — A498 Other bacterial infections of unspecified site: Secondary | ICD-10-CM | POA: Diagnosis not present

## 2018-08-19 DIAGNOSIS — Z8546 Personal history of malignant neoplasm of prostate: Secondary | ICD-10-CM

## 2018-08-19 DIAGNOSIS — E669 Obesity, unspecified: Secondary | ICD-10-CM | POA: Diagnosis present

## 2018-08-19 DIAGNOSIS — J9690 Respiratory failure, unspecified, unspecified whether with hypoxia or hypercapnia: Secondary | ICD-10-CM

## 2018-08-19 DIAGNOSIS — R0902 Hypoxemia: Secondary | ICD-10-CM | POA: Diagnosis not present

## 2018-08-19 DIAGNOSIS — E0590 Thyrotoxicosis, unspecified without thyrotoxic crisis or storm: Secondary | ICD-10-CM | POA: Diagnosis present

## 2018-08-19 DIAGNOSIS — Z888 Allergy status to other drugs, medicaments and biological substances status: Secondary | ICD-10-CM

## 2018-08-19 DIAGNOSIS — M25532 Pain in left wrist: Secondary | ICD-10-CM | POA: Diagnosis present

## 2018-08-19 DIAGNOSIS — I252 Old myocardial infarction: Secondary | ICD-10-CM | POA: Diagnosis not present

## 2018-08-19 DIAGNOSIS — K922 Gastrointestinal hemorrhage, unspecified: Secondary | ICD-10-CM | POA: Diagnosis present

## 2018-08-19 DIAGNOSIS — Z6830 Body mass index (BMI) 30.0-30.9, adult: Secondary | ICD-10-CM

## 2018-08-19 DIAGNOSIS — E119 Type 2 diabetes mellitus without complications: Secondary | ICD-10-CM | POA: Diagnosis not present

## 2018-08-19 DIAGNOSIS — M1812 Unilateral primary osteoarthritis of first carpometacarpal joint, left hand: Secondary | ICD-10-CM | POA: Diagnosis not present

## 2018-08-19 DIAGNOSIS — F0390 Unspecified dementia without behavioral disturbance: Secondary | ICD-10-CM | POA: Diagnosis present

## 2018-08-19 DIAGNOSIS — I5042 Chronic combined systolic (congestive) and diastolic (congestive) heart failure: Secondary | ICD-10-CM | POA: Diagnosis present

## 2018-08-19 DIAGNOSIS — R9431 Abnormal electrocardiogram [ECG] [EKG]: Secondary | ICD-10-CM | POA: Diagnosis present

## 2018-08-19 DIAGNOSIS — R112 Nausea with vomiting, unspecified: Secondary | ICD-10-CM | POA: Diagnosis not present

## 2018-08-19 DIAGNOSIS — R410 Disorientation, unspecified: Secondary | ICD-10-CM | POA: Diagnosis not present

## 2018-08-19 DIAGNOSIS — E11649 Type 2 diabetes mellitus with hypoglycemia without coma: Secondary | ICD-10-CM | POA: Diagnosis not present

## 2018-08-19 DIAGNOSIS — G4733 Obstructive sleep apnea (adult) (pediatric): Secondary | ICD-10-CM | POA: Diagnosis present

## 2018-08-19 DIAGNOSIS — M109 Gout, unspecified: Secondary | ICD-10-CM | POA: Diagnosis present

## 2018-08-19 DIAGNOSIS — I739 Peripheral vascular disease, unspecified: Secondary | ICD-10-CM | POA: Diagnosis present

## 2018-08-19 DIAGNOSIS — G9349 Other encephalopathy: Secondary | ICD-10-CM | POA: Diagnosis present

## 2018-08-19 DIAGNOSIS — E1122 Type 2 diabetes mellitus with diabetic chronic kidney disease: Secondary | ICD-10-CM | POA: Diagnosis present

## 2018-08-19 DIAGNOSIS — E1151 Type 2 diabetes mellitus with diabetic peripheral angiopathy without gangrene: Secondary | ICD-10-CM | POA: Diagnosis present

## 2018-08-19 DIAGNOSIS — Z79899 Other long term (current) drug therapy: Secondary | ICD-10-CM

## 2018-08-19 DIAGNOSIS — Z7982 Long term (current) use of aspirin: Secondary | ICD-10-CM

## 2018-08-19 DIAGNOSIS — I120 Hypertensive chronic kidney disease with stage 5 chronic kidney disease or end stage renal disease: Secondary | ICD-10-CM | POA: Diagnosis not present

## 2018-08-19 DIAGNOSIS — J984 Other disorders of lung: Secondary | ICD-10-CM | POA: Diagnosis not present

## 2018-08-19 DIAGNOSIS — R7881 Bacteremia: Secondary | ICD-10-CM | POA: Diagnosis not present

## 2018-08-19 DIAGNOSIS — I361 Nonrheumatic tricuspid (valve) insufficiency: Secondary | ICD-10-CM | POA: Diagnosis not present

## 2018-08-19 DIAGNOSIS — A4150 Gram-negative sepsis, unspecified: Secondary | ICD-10-CM | POA: Diagnosis not present

## 2018-08-19 DIAGNOSIS — R52 Pain, unspecified: Secondary | ICD-10-CM

## 2018-08-19 DIAGNOSIS — I371 Nonrheumatic pulmonary valve insufficiency: Secondary | ICD-10-CM | POA: Diagnosis not present

## 2018-08-19 DIAGNOSIS — E8889 Other specified metabolic disorders: Secondary | ICD-10-CM | POA: Diagnosis present

## 2018-08-19 DIAGNOSIS — I959 Hypotension, unspecified: Secondary | ICD-10-CM | POA: Diagnosis not present

## 2018-08-19 DIAGNOSIS — E785 Hyperlipidemia, unspecified: Secondary | ICD-10-CM | POA: Diagnosis present

## 2018-08-19 DIAGNOSIS — Z781 Physical restraint status: Secondary | ICD-10-CM

## 2018-08-19 DIAGNOSIS — R531 Weakness: Secondary | ICD-10-CM | POA: Diagnosis present

## 2018-08-19 DIAGNOSIS — N400 Enlarged prostate without lower urinary tract symptoms: Secondary | ICD-10-CM | POA: Diagnosis present

## 2018-08-19 LAB — I-STAT CHEM 8, ED
BUN: 62 mg/dL — AB (ref 8–23)
CHLORIDE: 107 mmol/L (ref 98–111)
Calcium, Ion: 0.95 mmol/L — ABNORMAL LOW (ref 1.15–1.40)
Creatinine, Ser: 12.1 mg/dL — ABNORMAL HIGH (ref 0.61–1.24)
Glucose, Bld: 117 mg/dL — ABNORMAL HIGH (ref 70–99)
HEMATOCRIT: 39 % (ref 39.0–52.0)
Hemoglobin: 13.3 g/dL (ref 13.0–17.0)
POTASSIUM: 5.3 mmol/L — AB (ref 3.5–5.1)
SODIUM: 136 mmol/L (ref 135–145)
TCO2: 28 mmol/L (ref 22–32)

## 2018-08-19 LAB — I-STAT TROPONIN, ED: TROPONIN I, POC: 0.07 ng/mL (ref 0.00–0.08)

## 2018-08-19 LAB — CBC WITH DIFFERENTIAL/PLATELET
BAND NEUTROPHILS: 0 %
BASOS ABS: 0 10*3/uL (ref 0.0–0.1)
Basophils Relative: 0 %
Blasts: 0 %
EOS ABS: 0 10*3/uL (ref 0.0–0.5)
Eosinophils Relative: 0 %
HCT: 42.3 % (ref 39.0–52.0)
HEMOGLOBIN: 12.6 g/dL — AB (ref 13.0–17.0)
Lymphocytes Relative: 6 %
Lymphs Abs: 0.9 10*3/uL (ref 0.7–4.0)
MCH: 29.9 pg (ref 26.0–34.0)
MCHC: 29.8 g/dL — AB (ref 30.0–36.0)
MCV: 100.2 fL — ABNORMAL HIGH (ref 80.0–100.0)
METAMYELOCYTES PCT: 0 %
MONOS PCT: 6 %
MYELOCYTES: 0 %
Monocytes Absolute: 0.9 10*3/uL (ref 0.1–1.0)
NEUTROS ABS: 12.8 10*3/uL — AB (ref 1.7–7.7)
Neutrophils Relative %: 88 %
Other: 0 %
Platelets: 227 10*3/uL (ref 150–400)
Promyelocytes Relative: 0 %
RBC: 4.22 MIL/uL (ref 4.22–5.81)
RDW: 18.1 % — AB (ref 11.5–15.5)
WBC Morphology: INCREASED
WBC: 14.6 10*3/uL — ABNORMAL HIGH (ref 4.0–10.5)
nRBC: 0 % (ref 0.0–0.2)
nRBC: 0 /100 WBC

## 2018-08-19 LAB — CBC
HCT: 35.8 % — ABNORMAL LOW (ref 39.0–52.0)
Hemoglobin: 11.3 g/dL — ABNORMAL LOW (ref 13.0–17.0)
MCH: 30.4 pg (ref 26.0–34.0)
MCHC: 31.6 g/dL (ref 30.0–36.0)
MCV: 96.2 fL (ref 80.0–100.0)
NRBC: 0 % (ref 0.0–0.2)
PLATELETS: 216 10*3/uL (ref 150–400)
RBC: 3.72 MIL/uL — ABNORMAL LOW (ref 4.22–5.81)
RDW: 18 % — ABNORMAL HIGH (ref 11.5–15.5)
WBC: 17.1 10*3/uL — AB (ref 4.0–10.5)

## 2018-08-19 LAB — BASIC METABOLIC PANEL
ANION GAP: 14 (ref 5–15)
BUN: 63 mg/dL — ABNORMAL HIGH (ref 8–23)
CALCIUM: 8.4 mg/dL — AB (ref 8.9–10.3)
CO2: 21 mmol/L — ABNORMAL LOW (ref 22–32)
Chloride: 103 mmol/L (ref 98–111)
Creatinine, Ser: 11.32 mg/dL — ABNORMAL HIGH (ref 0.61–1.24)
GFR, EST AFRICAN AMERICAN: 5 mL/min — AB (ref >60)
GFR, EST NON AFRICAN AMERICAN: 4 mL/min — AB (ref >60)
Glucose, Bld: 139 mg/dL — ABNORMAL HIGH (ref 70–99)
Potassium: 5.5 mmol/L — ABNORMAL HIGH (ref 3.5–5.1)
SODIUM: 138 mmol/L (ref 135–145)

## 2018-08-19 LAB — APTT: APTT: 35 s (ref 24–36)

## 2018-08-19 LAB — COMPREHENSIVE METABOLIC PANEL
ALT: 49 U/L — ABNORMAL HIGH (ref 0–44)
AST: 41 U/L (ref 15–41)
Albumin: 3.1 g/dL — ABNORMAL LOW (ref 3.5–5.0)
Alkaline Phosphatase: 128 U/L — ABNORMAL HIGH (ref 38–126)
Anion gap: 15 (ref 5–15)
BUN: 60 mg/dL — AB (ref 8–23)
CHLORIDE: 100 mmol/L (ref 98–111)
CO2: 24 mmol/L (ref 22–32)
Calcium: 9.4 mg/dL (ref 8.9–10.3)
Creatinine, Ser: 11.36 mg/dL — ABNORMAL HIGH (ref 0.61–1.24)
GFR calc Af Amer: 5 mL/min — ABNORMAL LOW (ref >60)
GFR, EST NON AFRICAN AMERICAN: 4 mL/min — AB (ref >60)
Glucose, Bld: 120 mg/dL — ABNORMAL HIGH (ref 70–99)
POTASSIUM: 5.3 mmol/L — AB (ref 3.5–5.1)
Sodium: 139 mmol/L (ref 135–145)
Total Bilirubin: 0.9 mg/dL (ref 0.3–1.2)
Total Protein: 7.6 g/dL (ref 6.5–8.1)

## 2018-08-19 LAB — GLUCOSE, CAPILLARY: GLUCOSE-CAPILLARY: 137 mg/dL — AB (ref 70–99)

## 2018-08-19 LAB — I-STAT CG4 LACTIC ACID, ED: Lactic Acid, Venous: 2.76 mmol/L (ref 0.5–1.9)

## 2018-08-19 LAB — PROTIME-INR
INR: 1.15
Prothrombin Time: 14.6 seconds (ref 11.4–15.2)

## 2018-08-19 LAB — PROCALCITONIN: Procalcitonin: 20.5 ng/mL

## 2018-08-19 LAB — TROPONIN I: TROPONIN I: 0.06 ng/mL — AB (ref <0.03)

## 2018-08-19 LAB — CBG MONITORING, ED: GLUCOSE-CAPILLARY: 111 mg/dL — AB (ref 70–99)

## 2018-08-19 LAB — LACTIC ACID, PLASMA: Lactic Acid, Venous: 1.8 mmol/L (ref 0.5–1.9)

## 2018-08-19 MED ORDER — CINACALCET HCL 30 MG PO TABS: 90.0000 mg | ORAL_TABLET | Freq: Two times a day (BID) | ORAL | Status: DC

## 2018-08-19 MED ORDER — CITALOPRAM HYDROBROMIDE 20 MG PO TABS
10.0000 mg | ORAL_TABLET | Freq: Every day | ORAL | Status: DC
  Administered 2018-08-21 – 2018-08-30 (×8): 10 mg via ORAL
  Filled 2018-08-19 (×10): qty 1

## 2018-08-19 MED ORDER — VANCOMYCIN HCL 10 G IV SOLR
2000.0000 mg | Freq: Once | INTRAVENOUS | Status: AC
  Administered 2018-08-19: 2000 mg via INTRAVENOUS
  Filled 2018-08-19: qty 2000

## 2018-08-19 MED ORDER — EZETIMIBE 10 MG PO TABS: 10.0000 mg | ORAL_TABLET | Freq: Every day | ORAL | Status: DC

## 2018-08-19 MED ORDER — RENA-VITE PO TABS
1.0000 | ORAL_TABLET | Freq: Every day | ORAL | Status: DC
  Administered 2018-08-21 – 2018-08-31 (×10): 1 via ORAL
  Filled 2018-08-19 (×10): qty 1

## 2018-08-19 MED ORDER — POLYETHYLENE GLYCOL 3350 17 G PO PACK: 17.0000 g | PACK | Freq: Every day | ORAL | Status: DC | PRN

## 2018-08-19 MED ORDER — HEPARIN SODIUM (PORCINE) 5000 UNIT/ML IJ SOLN
5000.0000 [IU] | Freq: Three times a day (TID) | INTRAMUSCULAR | Status: DC
  Administered 2018-08-21: 5000 [IU] via SUBCUTANEOUS
  Filled 2018-08-19 (×3): qty 1

## 2018-08-19 MED ORDER — PANTOPRAZOLE SODIUM 40 MG PO TBEC: 40.0000 mg | DELAYED_RELEASE_TABLET | Freq: Every evening | ORAL | Status: DC

## 2018-08-19 MED ORDER — METRONIDAZOLE IN NACL 5-0.79 MG/ML-% IV SOLN
500.0000 mg | Freq: Three times a day (TID) | INTRAVENOUS | Status: DC
  Administered 2018-08-19 – 2018-08-20 (×2): 500 mg via INTRAVENOUS
  Filled 2018-08-19 (×2): qty 100

## 2018-08-19 MED ORDER — ONDANSETRON HCL 4 MG/2ML IJ SOLN: 4.0000 mg | Freq: Four times a day (QID) | INTRAMUSCULAR | Status: DC | PRN

## 2018-08-19 MED ORDER — COLCHICINE 0.6 MG PO TABS: 0.6000 mg | ORAL_TABLET | Freq: Every day | ORAL | Status: DC

## 2018-08-19 MED ORDER — ACETAMINOPHEN 325 MG PO TABS
650.0000 mg | ORAL_TABLET | Freq: Four times a day (QID) | ORAL | Status: DC | PRN
  Administered 2018-08-25: 650 mg via ORAL
  Filled 2018-08-19 (×2): qty 2

## 2018-08-19 MED ORDER — SODIUM CHLORIDE 0.9 % IV BOLUS
1000.0000 mL | Freq: Once | INTRAVENOUS | Status: AC
  Administered 2018-08-19: 1000 mL via INTRAVENOUS

## 2018-08-19 MED ORDER — ACETAMINOPHEN 650 MG RE SUPP
650.0000 mg | Freq: Once | RECTAL | Status: AC
  Administered 2018-08-19: 650 mg via RECTAL
  Filled 2018-08-19: qty 1

## 2018-08-19 MED ORDER — COLESEVELAM HCL 625 MG PO TABS
1875.0000 mg | ORAL_TABLET | Freq: Two times a day (BID) | ORAL | Status: DC
  Administered 2018-08-22 (×2): 1875 mg via ORAL
  Administered 2018-08-23: 625 mg via ORAL
  Administered 2018-08-25 – 2018-08-31 (×10): 1875 mg via ORAL
  Filled 2018-08-19 (×22): qty 3

## 2018-08-19 MED ORDER — INSULIN ASPART 100 UNIT/ML ~~LOC~~ SOLN
0.0000 [IU] | Freq: Three times a day (TID) | SUBCUTANEOUS | Status: DC
  Administered 2018-08-20 – 2018-08-23 (×2): 1 [IU] via SUBCUTANEOUS
  Administered 2018-08-23: 3 [IU] via SUBCUTANEOUS
  Administered 2018-08-23 – 2018-08-24 (×2): 2 [IU] via SUBCUTANEOUS
  Administered 2018-08-24 – 2018-08-26 (×2): 1 [IU] via SUBCUTANEOUS
  Administered 2018-08-27: 2 [IU] via SUBCUTANEOUS
  Administered 2018-08-28: 5 [IU] via SUBCUTANEOUS
  Administered 2018-08-28 – 2018-08-29 (×3): 1 [IU] via SUBCUTANEOUS
  Administered 2018-08-29: 2 [IU] via SUBCUTANEOUS
  Administered 2018-08-30 – 2018-08-31 (×2): 1 [IU] via SUBCUTANEOUS

## 2018-08-19 MED ORDER — ASPIRIN EC 81 MG PO TBEC: 81.0000 mg | DELAYED_RELEASE_TABLET | Freq: Every day | ORAL | Status: DC

## 2018-08-19 MED ORDER — VANCOMYCIN HCL IN DEXTROSE 1-5 GM/200ML-% IV SOLN: 1000.0000 mg | INTRAVENOUS | Status: DC

## 2018-08-19 MED ORDER — ALLOPURINOL 100 MG PO TABS
100.0000 mg | ORAL_TABLET | Freq: Every day | ORAL | Status: DC
  Administered 2018-08-21 – 2018-08-31 (×10): 100 mg via ORAL
  Filled 2018-08-19 (×10): qty 1

## 2018-08-19 MED ORDER — METHIMAZOLE 5 MG PO TABS
2.5000 mg | ORAL_TABLET | Freq: Every day | ORAL | Status: DC
  Filled 2018-08-19 (×2): qty 1

## 2018-08-19 MED ORDER — CINACALCET HCL 30 MG PO TABS
90.0000 mg | ORAL_TABLET | Freq: Two times a day (BID) | ORAL | Status: DC
  Administered 2018-08-22 – 2018-08-31 (×13): 90 mg via ORAL
  Filled 2018-08-19 (×14): qty 3

## 2018-08-19 MED ORDER — ACETAMINOPHEN 650 MG RE SUPP: 650.0000 mg | Freq: Four times a day (QID) | RECTAL | Status: DC | PRN

## 2018-08-19 MED ORDER — SODIUM CHLORIDE 0.9 % IV BOLUS
2000.0000 mL | Freq: Once | INTRAVENOUS | Status: AC
  Administered 2018-08-19: 1000 mL via INTRAVENOUS

## 2018-08-19 MED ORDER — SODIUM CHLORIDE 0.9 % IV SOLN: 1.0000 g | INTRAVENOUS | Status: DC

## 2018-08-19 MED ORDER — ONDANSETRON HCL 4 MG PO TABS: 4.0000 mg | ORAL_TABLET | Freq: Four times a day (QID) | ORAL | Status: DC | PRN

## 2018-08-19 MED ORDER — SODIUM CHLORIDE 0.9 % IV SOLN
2.0000 g | Freq: Once | INTRAVENOUS | Status: AC
  Administered 2018-08-19: 2 g via INTRAVENOUS
  Filled 2018-08-19: qty 2

## 2018-08-19 NOTE — Progress Notes (Signed)
Pharmacy Antibiotic Note  [name] is a 67 y.o. male admitted on 08/19/2018 with sepsis of unknown source.  Pharmacy has been consulted for vancomycin and cefepime dosing.  Currently, febrile (Tmax 102). Of note, history of ESRD on HD T/Th/S. Patient was unable to complete dialysis today PTA. Future orders for inpatient dialysis have not yet been scheduled.  Patient was noted to have cephalexin allergy which presented as tongue swelling in October; patient tolerated multiple doses. EDP agreed that cefepime would be appropriate given no breathing issues in the past.  Plan: Cefepime 2 gm IV once, then 1 gm IV q24h Vancomycin 2000 mg IV once, then 1000 mg IV qHD Metronidazole 500 mg IV q8h F/u dialysis plans F/u cultures and LOT      Temp (24hrs), Avg:100.1 F (37.8 C), Min:98.2 F (36.8 C), Max:102 F (38.9 C)  No results for input(s): WBC, CREATININE, LATICACIDVEN, VANCOTROUGH, VANCOPEAK, VANCORANDOM, GENTTROUGH, GENTPEAK, GENTRANDOM, TOBRATROUGH, TOBRAPEAK, TOBRARND, AMIKACINPEAK, AMIKACINTROU, AMIKACIN in the last 168 hours.  Estimated Creatinine Clearance: 9.6 mL/min (A) (by C-G formula based on SCr of 9.04 mg/dL [facility])).    Allergies  Allergen Reactions  . Cephalexin Swelling and Other (See Comments)    Tolerated Rocephin 10/19. Tongue swelling, but no breathing issues Tolerated Zosyn in Sept 2019 without any issues  . Statins Other (See Comments)    Weak muscles  . Ciprofloxacin Rash    Antimicrobials this admission: Vancomycin 11/19>> Cefepime 11/19>> Flagyl 11/19>>  Microbiology results: 11/19 BCx:  Thank you for allowing pharmacy to be a part of this patient's care.  Willia Craze, Pharmacy Student

## 2018-08-19 NOTE — Progress Notes (Addendum)
CRITICAL VALUE ALERT  Critical Value:  troponin  Date & Time Notied:  08/19/18 2249  Provider Notified: K. Schorr 08/19/18 @ 2252  Orders Received/Actions taken: continue to closely monitor

## 2018-08-19 NOTE — ED Notes (Signed)
Patient transported to CT 

## 2018-08-19 NOTE — ED Provider Notes (Signed)
Medical screening examination/treatment/procedure(s) were conducted as a shared visit with non-physician practitioner(s) and myself.  I personally evaluated the patient during the encounter.  EKG Interpretation  Date/Time:  Tuesday August 19 2018 16:09:55 EST Ventricular Rate:  101 PR Interval:    QRS Duration: 101 QT Interval:  368 QTC Calculation: 477 R Axis:   43 Text Interpretation:  Sinus tachycardia Borderline prolonged QT interval No significant change since last tracing Confirmed by Dorie Rank [phone]) on 08/19/2018 4:17:10 PM  Pt presents with generalized weakness lethargy s/p dialysis.[name]  Hx of multiple medical problems.  BP borderline low.  No fever at this time but will evaluate further.  DDx includes,  Stroke, TIA, acute infection, sepsis.  Pt spiked a fever.  Sx are consistent with sepsis.  BPs are persistently low.  Additional fluid bolus ordered.  Abx given. Admit for sepsis.   Dorie Rank, MD 08/19/18 [phone]

## 2018-08-19 NOTE — H&P (Addendum)
History and Physical    [name] OZD:[id number] DOB: 03/13/1951 DOA: 08/19/2018  PCP: Biagio Borg, MD  Patient coming from: Home.  Chief Complaint: Weakness.  HPI: [name] is a 67 y.o. male with history of ESRD on hemodialysis on Tuesday Thursday Saturday, diabetes mellitus type 2, hyperthyroidism, anemia, CHF was admitted last month for sepsis was brought to the ER after dialysis by patient's wife after patient was found to be weak.  Patient states he was not feeling well last 2 days and this morning he had abdominal discomfort and during dialysis he had vomited.  After dialysis patient was taken home.  And on reaching home patient was unable to get out of the car because of weakness.  Patient's wife called EMS and patient was brought to the ER.  Patient also was found to be confused.  Denies any headache chest pain or shortness of breath.  Has some abdominal discomfort diffusely.  Denies diarrhea.  ED Course: In the ER patient was hypotensive with blood pressure in the 25Z systolic fever of 563 F.  CT head was unremarkable.  Patient was given 2 L fluid bolus.  At the time of my exam patient has become more alert awake.  Patient was started on empiric antibiotics for sepsis admitted for further management.  Blood pressure improved with fluids.  Review of Systems: As per HPI, rest all negative.   Past Medical History:  Diagnosis Date  . Allergic rhinitis, cause unspecified 02/24/2014  . Anemia 06/16/2011  . BENIGN PROSTATIC HYPERTROPHY 10/14/2009  . CAD, NATIVE VESSEL 02/06/2009   a. 06/2007 s/p Taxus DES to the RCA;  b. 08/2016 NSTEMI in setting of SVT/PCI: LM 30ost, LAD 40m (3.0x16 Synergy DES), LCX 76m, OM1 60, RI 40, RCA 70p/m, 50m - not amenable to PCI.  [name] Cervical radiculopathy, chronic 02/23/2016   Right c5-6 by NCS/EMG  . Chronic combined systolic (congestive) and diastolic (congestive) heart failure ([place])    a. 10/2016 Echo: EF 40-45%, Gr2 DD. mildly dil LA.  [name] COLONIC  POLYPS, HX OF 10/14/2009  . Dementia ([place]) [id number]  . Depression 09/24/2015  . DIABETES MELLITUS, TYPE II 02/01/2010  . DYSLIPIDEMIA 06/18/2007  . ESRD (end stage renal disease) on dialysis ([place]) 08/04/2010   ESRD due to DM adn HTN, started HD in 2011. As of 2019 has a R arm graft and gets HD on TTS sched  . FOOT PAIN 08/12/2008  . GAIT DISTURBANCE 03/03/2010  . GASTROENTERITIS, VIRAL 10/14/2009  . GERD 06/18/2007  . GOITER, MULTINODULAR 12/26/2007  . GOUT 06/18/2007  . GYNECOMASTIA 07/17/2010  . Hemodialysis access, fistula mature [facility])    Dialysis T-Th-Sa ([place]) Right upper arm fistula  . Hyperlipidemia 10/16/2011  . Hyperparathyroidism, secondary ([place]) 06/16/2011  . HYPERTENSION 06/18/2007  . Hyperthyroidism   . Hypocalcemia 06/07/2010  . Ischemic cardiomyopathy    a. 10/2016 Echo: EF 40-45%.  . Lumbar stenosis with neurogenic claudication   . ONYCHOMYCOSIS, TOENAILS 12/26/2007  . OSA on CPAP 10/16/2011  . Other malaise and fatigue 11/24/2009  . PERIPHERAL NEUROPATHY 06/18/2007  . Prostate cancer ([place])   . PSVT (paroxysmal supraventricular tachycardia) ([place])    a. 51/8841 complicated by NSTEMI;  b. 11/2016 Treated w/ adenosine in ED;  c. 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  [name] PULMONARY NODULE, RIGHT LOWER LOBE 06/08/2009  . Sleep apnea    cpap machine and o2  . TRANSAMINASES, SERUM, ELEVATED 02/01/2010  . Transfusion history    none  recent  . Unspecified hypotension 01/30/2010    Past Surgical History:  Procedure Laterality Date  . A/V SHUNTOGRAM N/A 04/07/2018   Procedure: A/V SHUNTOGRAM;  Surgeon: Waynetta Sandy, MD;  Location: [place] [facility];  Service: Cardiovascular;  Laterality: N/A;  . ARTERIOVENOUS GRAFT PLACEMENT Right 2009   forearm/notes 02/01/2011  . AV FISTULA PLACEMENT  11/07/2011   Procedure: INSERTION OF ARTERIOVENOUS (AV) GORE-TEX GRAFT ARM;  Surgeon: Tinnie Gens, MD;  Location: [place];  Service: Vascular;  Laterality: Left;  . BACK SURGERY  1998  .  BASCILIC VEIN TRANSPOSITION Right 02/27/2013   Procedure: BASCILIC VEIN TRANSPOSITION;  Surgeon: Mal Misty, MD;  Location: [place];  Service: Vascular;  Laterality: Right;  Right Basilic Vein Transposition   . CARDIAC CATHETERIZATION N/A 08/06/2016   Procedure: Left Heart Cath and Coronary Angiography;  Surgeon: Jolaine Artist, MD;  Location: [place] [facility];  Service: Cardiovascular;  Laterality: N/A;  . CARDIAC CATHETERIZATION N/A 08/07/2016   Procedure: Coronary/Graft Atherectomy-CSI LAD;  Surgeon: Peter M Martinique, MD;  Location: [place] [facility];  Service: Cardiovascular;  Laterality: N/A;  . CERVICAL SPINE SURGERY  2/09   "to repair nerve problems in my left arm"  . CHOLECYSTECTOMY    . COLONOSCOPY WITH PROPOFOL N/A 04/26/2017   Procedure: COLONOSCOPY WITH PROPOFOL;  Surgeon: Otis Brace, MD;  Location: [place];  Service: Gastroenterology;  Laterality: N/A;  . CORONARY ANGIOPLASTY WITH STENT PLACEMENT  06/11/2008  . CORONARY ANGIOPLASTY WITH STENT PLACEMENT  06/2007   TAXUS stent to RCA/notes 01/31/2011  . ESOPHAGOGASTRODUODENOSCOPY  09/28/2011   Procedure: ESOPHAGOGASTRODUODENOSCOPY (EGD);  Surgeon: Missy Sabins, MD;  Location[facility]COPY;  Service: Endoscopy;  Laterality: N/A;  . ESOPHAGOGASTRODUODENOSCOPY N/A 04/07/2015   Procedure: ESOPHAGOGASTRODUODENOSCOPY (EGD);  Surgeon: Teena Irani, MD;  Location: [facility];  Service: Endoscopy;  Laterality: N/A;  . ESOPHAGOGASTRODUODENOSCOPY N/A 04/19/2015   Procedure: ESOPHAGOGASTRODUODENOSCOPY (EGD);  Surgeon: Arta Silence, MD;  Location: [facility] ENDOSCOPY;  Service: Endoscopy;  Laterality: N/A;  . ESOPHAGOGASTRODUODENOSCOPY (EGD) WITH PROPOFOL N/A 06/13/2018   Procedure: ESOPHAGOGASTRODUODENOSCOPY (EGD) WITH PROPOFOL;  Surgeon: Wonda Horner, MD;  Location: [facility] ENDOSCOPY;  Service: Endoscopy;  Laterality: N/A;  . FLEXIBLE SIGMOIDOSCOPY N/A 05/21/2017   Procedure: Conni Elliot;  Surgeon: Clarene Essex, MD;  Location: [place];  Service: Endoscopy;  Laterality: N/A;  . FLEXIBLE SIGMOIDOSCOPY Left 07/02/2017   Procedure: FLEXIBLE SIGMOIDOSCOPY;  Surgeon: Laurence Spates, MD;  Location: [place];  Service: Endoscopy;  Laterality: Left;  . FOREIGN BODY REMOVAL  09/2003   via upper endoscopy/notes 02/12/2011  . GIVENS CAPSULE STUDY  09/30/2011   Procedure: GIVENS CAPSULE STUDY;  Surgeon: Jeryl [place], MD;  Location: [facility] ENDOSCOPY;  Service: Endoscopy;  Laterality: N/A;  . INSERTION OF DIALYSIS CATHETER Right 2014  . INSERTION OF DIALYSIS CATHETER Left 02/11/2013   Procedure: INSERTION OF DIALYSIS CATHETER;  Surgeon: Conrad Viola, MD;  Location: [place];  Service: Vascular;  Laterality: Left;  Ultrasound guided  . LUMBAR LAMINECTOMY/DECOMPRESSION MICRODISCECTOMY Bilateral 07/31/2017   Procedure: LAMINECTOMY AND FORAMINOTOMY- BILATERAL LUMBAR TWO- LUMBAR THREE;  Surgeon: Earnie Larsson, MD;  Location: [place];  Service: Neurosurgery;  Laterality: Bilateral;  LAMINECTOMY AND FORAMINOTOMY- BILATERAL LUMBAR 2- LUMBAR 3  . PERIPHERAL VASCULAR BALLOON ANGIOPLASTY  04/07/2018   Procedure: PERIPHERAL VASCULAR BALLOON ANGIOPLASTY;  Surgeon: Waynetta Sandy, MD;  Location: [place] [facility];  Service: Cardiovascular;;  RUE AVF  . REMOVAL OF A DIALYSIS CATHETER Right 02/11/2013   Procedure: REMOVAL  OF A DIALYSIS CATHETER;  Surgeon: Conrad Valencia, MD;  Location: [place];  Service: Vascular;  Laterality: Right;  . SAVORY DILATION N/A 04/07/2015   Procedure: SAVORY DILATION;  Surgeon: Teena Irani, MD;  Location: [facility] ENDOSCOPY;  Service: Endoscopy;  Laterality: N/A;  . SHUNTOGRAM N/A 09/20/2011   Procedure: Earney Mallet;  Surgeon: Conrad [place], MD;  Location: [facility] CATH LAB;  Service: Cardiovascular;  Laterality: N/A;  . SVT ABLATION N/A 11/26/2016   Procedure: SVT Ablation;  Surgeon: Will Meredith Leeds, MD;  Location: [place] CV LAB;  Service: Cardiovascular;  Laterality: N/A;  . TONSILLECTOMY    . TOTAL KNEE ARTHROPLASTY Right  08/02/2015   Procedure: TOTAL KNEE ARTHROPLASTY;  Surgeon: Renette Butters, MD;  Location: [place];  Service: Orthopedics;  Laterality: Right;  . VENOGRAM N/A 01/26/2013   Procedure: VENOGRAM;  Surgeon: Angelia Mould, MD;  Location: [facility] CATH LAB;  Service: Cardiovascular;  Laterality: N/A;     reports that he quit smoking about 12 years ago. His smoking use included cigarettes. He has a 25.00 pack-year smoking history. He quit smokeless tobacco use about 12 years ago. He reports that he does not drink alcohol or use drugs.  Allergies  Allergen Reactions  . Cephalexin Swelling and Other (See Comments)    Tolerated Rocephin 10/19. Tongue swelling, but no breathing issues Tolerated Zosyn in Sept 2019 without any issues  . Statins Other (See Comments)    Weak muscles  . Ciprofloxacin Rash    Family History  Problem Relation Age of Onset  . Heart disease Sister   . Thyroid nodules Sister   . Heart disease Father   . Diabetes Father   . Kidney failure Father   . Hypertension Father   . Healthy Child   . Healthy Child   . Healthy Child   . Cancer Neg Hx     Prior to Admission medications   Medication Sig Start Date End Date Taking? Authorizing Provider  acetaminophen (TYLENOL) 325 MG tablet Take 650 mg by mouth every 6 (six) hours as needed for mild pain or headache.    Yes [provider]  Alirocumab (PRALUENT) 150 MG/ML SOPN Inject 150 mg into the skin every 14 (fourteen) days. 03/05/18  Yes Lorretta Harp, MD  allopurinol (ZYLOPRIM) 100 MG tablet TAKE ONE TABLET BY MOUTH ONCE DAILY Patient taking differently: Take 100 mg by mouth daily.  10/14/17  Yes Biagio Borg, MD  SENSIPAR 90 MG tablet Take 90 mg by mouth 2 (two) times daily. 09/18/16  Yes [provider]  aspirin EC 81 MG tablet Take 81 mg by mouth daily.    [provider]  betamethasone dipropionate (DIPROLENE) 0.05 % cream Apply 1 application topically daily as needed (leg sores).     [provider]  citalopram (CELEXA) 10 MG tablet Take 1 tablet (10 mg total) by mouth at bedtime. 10/14/17   Biagio Borg, MD  colchicine 0.6 MG tablet Take 1 tablet (0.6 mg total) by mouth daily. 08/08/18   Biagio Borg, MD  doxercalciferol ([name]) 4 MCG/2ML injection Inject 2 mLs (4 mcg total) into the vein Every Tuesday,Thursday,and Saturday with dialysis. 07/24/18   Hosie Poisson, MD  ezetimibe (ZETIA) 10 MG tablet TAKE ONE TABLET BY MOUTH ONCE DAILY 04/08/17   Bensimhon, Shaune Pascal, MD  ferric citrate (AURYXIA) 1 GM 210 MG(Fe) tablet Take 210-420 mg by mouth See admin instructions. Take 420 mg by mouth two times a day and 210  mg with each snack    [provider]  gabapentin (NEURONTIN) 300 MG capsule Take 1 capsule (300 mg total) by mouth at bedtime. Patient taking differently: Take 600 mg by mouth at bedtime.  07/05/17   Domenic Polite, MD  gabapentin (NEURONTIN) 300 MG capsule TAKE 1 TO 2 CAPSULES BY MOUTH AT BEDTIME AS NEEDED FOR PAIN AND FOR SLEEP Patient taking differently: Take 300-600 mg by mouth at bedtime as needed (for pain or sleep).  08/18/18   Biagio Borg, MD  hydroxypropyl methylcellulose / hypromellose (ISOPTO TEARS / GONIOVISC) 2.5 % ophthalmic solution Place 1 drop into both eyes 3 (three) times daily as needed for dry eyes. 05/03/17   Biagio Borg, MD  methimazole (TAPAZOLE) 5 MG tablet Take 0.5 tablets (2.5 mg total) by mouth daily. 05/16/18   Renato Shin, MD  multivitamin (RENA-VIT) TABS tablet Take 1 tablet by mouth daily. Reported on 10/24/2015    [provider]  pantoprazole (PROTONIX) 40 MG tablet Take 40 mg by mouth every evening.  08/31/16   [provider]  polyethylene glycol (MIRALAX / GLYCOLAX) packet Take 17 g by mouth daily. Patient taking differently: Take 17 g by mouth daily as needed for mild constipation.  05/23/17   Florencia Reasons, MD  promethazine (PHENERGAN) 25 MG tablet  08/19/18   [provider]  [facility] 625 MG  tablet TAKE THREE TABLETS BY MOUTH TWICE DAILY WITH MEALS Patient taking differently: Take 1,875 mg by mouth 2 (two) times daily with a meal.  10/04/17   Biagio Borg, MD  zolpidem (AMBIEN) 5 MG tablet Take 1 tablet (5 mg total) by mouth at bedtime as needed. for sleep Patient taking differently: Take 5 mg by mouth at bedtime as needed for sleep.  06/20/18   Biagio Borg, MD    Physical Exam: Vitals:   08/19/18 1942 08/19/18 1945 08/19/18 2000 08/19/18 2015  BP: (!) 85/58 (!) 92/57 (!) 92/57 (!) 94/59  Pulse: 99 96 96 96  Resp: 20 19 (!) 27 18  Temp: 98.8 F (37.1 C)     TempSrc: Oral     SpO2: 95% 96% 95% 97%  Weight:      Height:          Constitutional: Moderately built and nourished. Vitals:   08/19/18 1942 08/19/18 1945 08/19/18 2000 08/19/18 2015  BP: (!) 85/58 (!) 92/57 (!) 92/57 (!) 94/59  Pulse: 99 96 96 96  Resp: 20 19 (!) 27 18  Temp: 98.8 F (37.1 C)     TempSrc: Oral     SpO2: 95% 96% 95% 97%  Weight:      Height:       Eyes: Anicteric no pallor. ENMT: No discharge from the ears eyes nose or mouth. Neck: No mass felt.  No neck rigidity. Respiratory: No rhonchi or crepitations. Cardiovascular: S1-S2 heard no murmurs appreciated. Abdomen: Soft nontender bowel sounds present. Musculoskeletal: No edema.  No joint effusion. Skin: No rash. Neurologic: Alert awake oriented to time place and person.  Moves all extremities. Psychiatric: Appears normal.  Normal affect.   Labs on Admission: I have personally reviewed following labs and imaging studies  CBC: Recent Labs  Lab 08/19/18 1708 08/19/18 1716  WBC 14.6*  --   NEUTROABS 12.8*  --   HGB 12.6* 13.3  HCT 42.3 39.0  MCV 100.2*  --   PLT 227  --    Basic Metabolic Panel: Recent Labs  Lab 08/19/18 1618 08/19/18 1716  NA 139 136  K 5.3* 5.3*  CL 100 107  CO2 24  --   GLUCOSE 120* 117*  BUN 60* 62*  CREATININE 11.36* 12.10*  CALCIUM 9.4  --    GFR: Estimated Creatinine Clearance: 7.2 mL/min  (A) (by C-G formula based on SCr of 12.1 mg/dL (H)). Liver Function Tests: Recent Labs  Lab 08/19/18 1618  AST 41  ALT 49*  ALKPHOS 128*  BILITOT 0.9  PROT 7.6  ALBUMIN 3.1*   No results for input(s): LIPASE, AMYLASE in the last 168 hours. No results for input(s): AMMONIA in the last 168 hours. Coagulation Profile: No results for input(s): INR, PROTIME in the last 168 hours. Cardiac Enzymes: No results for input(s): CKTOTAL, CKMB, CKMBINDEX, TROPONINI in the last 168 hours. BNP (last 3 results) No results for input(s): PROBNP in the last 8760 hours. HbA1C: No results for input(s): HGBA1C in the last 72 hours. CBG: Recent Labs  Lab 08/19/18 1633  GLUCAP 111*   Lipid Profile: No results for input(s): CHOL, HDL, LDLCALC, TRIG, CHOLHDL, LDLDIRECT in the last 72 hours. Thyroid Function Tests: Recent Labs    08/18/18 1505  TSH 0.70  FREET4 0.85   Anemia Panel: No results for input(s): VITAMINB12, FOLATE, FERRITIN, TIBC, IRON, RETICCTPCT in the last 72 hours. Urine analysis:    Component Value Date/Time   COLORURINE LT. YELLOW 06/26/2013 1200   APPEARANCEUR CLEAR 06/26/2013 1200   LABSPEC 1.020 06/26/2013 1200   PHURINE 8.5 06/26/2013 1200   GLUCOSEU 100 06/26/2013 1200   HGBUR MODERATE 06/26/2013 1200   BILIRUBINUR NEGATIVE 06/26/2013 1200   KETONESUR NEGATIVE 06/26/2013 1200   PROTEINUR >300 (A) 01/04/2010 1032   UROBILINOGEN 0.2 06/26/2013 1200   NITRITE NEGATIVE 06/26/2013 1200   LEUKOCYTESUR TRACE 06/26/2013 1200   Sepsis Labs: @LABRCNTIP (procalcitonin:4,lacticidven:4) )No results found for this or any previous visit (from the past 240 hour(s)).   Radiological Exams on Admission: Ct Head Wo Contrast  Result Date: 08/19/2018 CLINICAL DATA:  Altered level of consciousness with fever. EXAM: CT HEAD WITHOUT CONTRAST TECHNIQUE: Contiguous axial images were obtained from the base of the skull through the vertex without intravenous contrast. COMPARISON:   07/16/2018 FINDINGS: Brain: Mild involutional changes of the brain with minimal small vessel ischemic disease of periventricular white matter. No large vascular territory infarct, hemorrhage or midline shift. No hydrocephalus. Midline fourth ventricle and basal cisterns without effacement. Brainstem and cerebellum appear nonacute. Vascular: No hyperdense vessel signs. Atherosclerosis of the carotid siphons bilaterally. Skull: Intact Sinuses/Orbits: No acute finding. Other: None. IMPRESSION: Minimal small vessel ischemic disease of periventricular white matter. No acute intracranial abnormality. Electronically Signed   By: Ashley Royalty M.D.   On: 08/19/2018 18:45   Dg Chest Port 1 View  Result Date: 08/19/2018 CLINICAL DATA:  Altered mental status EXAM: PORTABLE CHEST 1 VIEW COMPARISON:  07/16/2018 FINDINGS: Cardiomegaly with vascular congestion. Bibasilar atelectasis. No visible effusions or acute bony abnormality. IMPRESSION: Cardiomegaly with vascular congestion and bibasilar atelectasis. Electronically Signed   By: Rolm Baptise M.D.   On: 08/19/2018 16:38    EKG: Independently reviewed.  Sinus tachycardia with borderline prolonged QT.  Assessment/Plan Principal Problem:   Sepsis ([place]) Active Problems:   Ischemic cardiomyopathy   OSA (obstructive sleep apnea)   Hyperthyroidism   Acute encephalopathy   DM (diabetes mellitus) type II controlled with renal manifestation ([name])   PAD (peripheral artery disease) ([name])   Status post coronary artery stent placement   ESRD on hemodialysis ([name])   Chronic GI  bleeding   Chronic combined systolic (congestive) and diastolic (congestive) heart failure ([place])    1. Sepsis source not clear could be intra-abdominal for which I would be ordering CT scan.  Continue with empiric antibiotics for now follow blood cultures lactate and procalcitonin levels. 2. Diabetes mellitus type 2 not on any medications.  Will keep patient on sliding scale coverage. 3. ESRD  on hemodialysis on Tuesday Thursday Saturday.  Has had dialysis today.  As per the patient's wife there was some issues with dialysis access.  Access site does not look infected at this time.  Potassium is mildly elevated.  Will recheck metabolic panel. 4. Sleep apnea on CPAP. 5. Hyperthyroidism on methimazole. 6. CAD denies any chest pain. 7. History of gout on colchicine. 8. Hyperlipidemia on Zetia and WelChol. 9. Acute encephalopathy likely from sepsis improved.  Holding gabapentin until patient's mental status completely improved. 10. Anemia likely from ESRD follow CBC. 11. History of chronic CHF and fluid management per nephrology.   DVT prophylaxis: Heparin. Code Status: Full code. Family Communication: Patient's wife. Disposition Plan: Home. Consults called: Nephrology. Admission status: Inpatient.   Rise Patience MD [facility] Pager [phone].  If 7PM-7AM, please contact night-coverage [email or web address] Password [facility]  08/19/2018, 8:46 PM

## 2018-08-19 NOTE — Progress Notes (Signed)
Asked patient if he would like CPAP for tonight.  Patient stated he would like to see machine, stated old ones were too loud.  I explained that the ones now were much quieter.  Asked about mask that patient uses at home.  Patient stated that he did not wish to use one tonight.

## 2018-08-19 NOTE — ED Provider Notes (Signed)
[facility] EMERGENCY DEPARTMENT Provider Note   CSN: [id number] Arrival date & time: 08/19/18  1601     History   Chief Complaint Chief Complaint  Patient presents with  . Altered Mental Status    HPI [name] is a 67 y.o. male with a past medical history of CHF, DM 2, ESRD on HD Tuesday Thursday Saturday, recently admitted 10/16 for sepsis from Klebsiella pneumonia, who presents today for evaluation of altered mental status.  History is obtained primarily from EMS.  EMS reports that according to patient's wife patient has generally not been feeling well recently.  He has been feeling worse and worse over the past few days.  He went to dialysis today, however was unable to complete treatment due to not feeling well.  Upon EMS arrival patient was in the passenger seat of the vehicle, unable to get out on his own, oriented to person, not to place or time.  He was groggy however eyes would open to voice.  EMS reports possible weakness on the left side mostly in the left wrist, however says that family states that this is his usual and is not changed.  EMS says blood sugar was normal.  When awakened patient tells me he does not have pain anywhere.  He denies chest pain, headache or shortness of breath.  Patient is unable to further contribute to history due to AMS.   Of note I have recently seen the patient personally in the ER for AMS on 07/16/18.  At that time his physical exam was inconsistent, he had tremor in the jaw.  He was inconsistently weak in bilateral lower legs but also globally.  He was found to be septic.    HPI  Past Medical History:  Diagnosis Date  . Allergic rhinitis, cause unspecified 02/24/2014  . Anemia 06/16/2011  . BENIGN PROSTATIC HYPERTROPHY 10/14/2009  . CAD, NATIVE VESSEL 02/06/2009   a. 06/2007 s/p Taxus DES to the RCA;  b. 08/2016 NSTEMI in setting of SVT/PCI: LM 30ost, LAD 84m (3.0x16 Synergy DES), LCX 37m, OM1 60, RI 40, RCA 70p/m, 66m -  not amenable to PCI.  [name] Cervical radiculopathy, chronic 02/23/2016   Right c5-6 by NCS/EMG  . Chronic combined systolic (congestive) and diastolic (congestive) heart failure ([place])    a. 10/2016 Echo: EF 40-45%, Gr2 DD. mildly dil LA.  [name] COLONIC POLYPS, HX OF 10/14/2009  . Dementia ([place]) [id number]  . Depression 09/24/2015  . DIABETES MELLITUS, TYPE II 02/01/2010  . DYSLIPIDEMIA 06/18/2007  . ESRD (end stage renal disease) on dialysis ([place]) 08/04/2010   ESRD due to DM adn HTN, started HD in 2011. As of 2019 has a R arm graft and gets HD on TTS sched  . FOOT PAIN 08/12/2008  . GAIT DISTURBANCE 03/03/2010  . GASTROENTERITIS, VIRAL 10/14/2009  . GERD 06/18/2007  . GOITER, MULTINODULAR 12/26/2007  . GOUT 06/18/2007  . GYNECOMASTIA 07/17/2010  . Hemodialysis access, fistula mature [facility])    Dialysis T-Th-Sa ([place]) Right upper arm fistula  . Hyperlipidemia 10/16/2011  . Hyperparathyroidism, secondary ([place]) 06/16/2011  . HYPERTENSION 06/18/2007  . Hyperthyroidism   . Hypocalcemia 06/07/2010  . Ischemic cardiomyopathy    a. 10/2016 Echo: EF 40-45%.  . Lumbar stenosis with neurogenic claudication   . ONYCHOMYCOSIS, TOENAILS 12/26/2007  . OSA on CPAP 10/16/2011  . Other malaise and fatigue 11/24/2009  . PERIPHERAL NEUROPATHY 06/18/2007  . Prostate cancer ([place])   . PSVT (paroxysmal supraventricular tachycardia) ([place])  a. 46/8032 complicated by NSTEMI;  b. 11/2016 Treated w/ adenosine in ED;  c. 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  [name] PULMONARY NODULE, RIGHT LOWER LOBE 06/08/2009  . Sleep apnea    cpap machine and o2  . TRANSAMINASES, SERUM, ELEVATED 02/01/2010  . Transfusion history    none recent  . Unspecified hypotension 01/30/2010    Patient Active Problem List   Diagnosis Date Noted  . Acute gouty arthritis 08/08/2018  . Sepsis ([place]) 07/16/2018  . Acute conjunctivitis of both eyes 06/21/2018  . Thrush 06/21/2018  . HCAP (healthcare-associated pneumonia) 06/11/2018  . GI bleed 06/11/2018    . Recurrent falls 05/14/2018  . Chronic combined systolic (congestive) and diastolic (congestive) heart failure ([place]) 01/15/2018  . Abscess of left leg 10/14/2017  . Insomnia 10/14/2017  . Lumbar stenosis with neurogenic claudication 07/31/2017  . Lightheadedness   . Rectal bleeding   . Acute pulmonary edema ([name])   . Gait disorder 05/05/2017  . Chronic GI bleeding 04/22/2017  . Acute GI bleeding 04/22/2017  . Weakness 12/27/2016  . Generalized weakness 12/27/2016  . History of PSVT (paroxysmal supraventricular tachycardia) 10/16/2016  . Abdominal pain 08/27/2016  . Abnormal findings on diagnostic imaging of liver and biliary tract 08/27/2016  . Bloating symptom 08/27/2016  . Hematochezia 08/27/2016  . Early satiety 08/27/2016  . Eructation 08/27/2016  . Esophageal ulcer 08/27/2016  . Flatulence, eructation and gas pain 08/27/2016  . General weakness 08/27/2016  . Skin sensation disturbance 08/27/2016  . Stricture of esophagus 08/27/2016  . Alternating constipation and diarrhea 08/16/2016  . Status post coronary artery stent placement   . ST segment depression 08/05/2016  . PAD (peripheral artery disease) ([place]) 06/18/2016  . Hypotension 04/29/2016  . Cervical radiculopathy, chronic 02/23/2016  . Memory loss 12/22/2015  . Left-sided weakness 12/22/2015  . Peripheral polyneuropathy 12/22/2015  . DM (diabetes mellitus) type II controlled with renal manifestation ([place]) 10/31/2015  . Nausea and vomiting 10/31/2015  . Malignant neoplasm of prostate ([place]) 10/24/2015  . Right leg pain 09/04/2015  . Chest pain 08/09/2015  . Acute encephalopathy 08/09/2015  . S/P right total knee replacement 08/09/2015  . Primary osteoarthritis of right knee 08/05/2015  . Neuropathy due to secondary diabetes mellitus ([place]) 08/05/2015  . Arthritis of knee 08/02/2015  . Headache 06/08/2015  . Melena 04/19/2015  . Acute blood loss anemia 04/18/2015  . Hyperthyroidism 03/11/2015  . Thigh pain  12/08/2014  . Nodule of right lung 09/14/2014  . PSA elevation 08/30/2014  . ESRD on hemodialysis ([place]) 08/16/2014  . Essential hypertension 08/16/2014  . Pre-transplant evaluation for kidney transplant 08/16/2014  . Allergic rhinitis 02/24/2014  . Elevated PSA 02/24/2014  . Vertigo 10/14/2013  . Cough 06/26/2013  . Numbness 05/15/2013  . Other complications due to renal dialysis device, implant, and graft 01/14/2013  . Right hand pain 03/28/2012  . Dysphagia 03/28/2012  . Hypogonadism, male 03/28/2012  . OSA (obstructive sleep apnea) 10/16/2011  . HLD (hyperlipidemia) 10/16/2011  . CAD S/P percutaneous coronary angioplasty 09/28/2011  . NSTEMI (non-ST elevated myocardial infarction) ([place]) 09/28/2011  . AVM (arteriovenous malformation) 09/27/2011  . Anemia associated with acute blood loss 09/26/2011  . Ischemic cardiomyopathy 06/16/2011  . Hyperparathyroidism, secondary ([place]) 06/16/2011  . Preventative health care 03/11/2011  . NECK PAIN 07/31/2010  . GYNECOMASTIA 07/17/2010  . DIZZINESS 07/17/2010  . GAIT DISTURBANCE 03/03/2010  . Thyrotoxicosis 02/02/2010  . Elevated levels of transaminase & lactic acid dehydrogenase 02/01/2010  . Other malaise and fatigue 11/24/2009  .  Depression 10/14/2009  . BENIGN PROSTATIC HYPERTROPHY 10/14/2009  . COLONIC POLYPS, HX OF 10/14/2009  . Dementia ([place]) 09/02/2009  . PULMONARY NODULE, RIGHT LOWER LOBE 06/08/2009  . DYSPNEA 10/29/2008  . FOOT PAIN 08/12/2008  . ONYCHOMYCOSIS, TOENAILS 12/26/2007  . GOITER, MULTINODULAR 12/26/2007  . Gout 06/18/2007  . Neuropathy ([place]) 06/18/2007  . Hypertensive heart disease with CHF (congestive heart failure) ([place]) 06/18/2007  . Gastro-esophageal reflux disease without esophagitis 06/18/2007    Past Surgical History:  Procedure Laterality Date  . A/V SHUNTOGRAM N/A 04/07/2018   Procedure: A/V SHUNTOGRAM;  Surgeon: Waynetta Sandy, MD;  Location: [place] CV LAB;  Service: Cardiovascular;   Laterality: N/A;  . ARTERIOVENOUS GRAFT PLACEMENT Right 2009   forearm/notes 02/01/2011  . AV FISTULA PLACEMENT  11/07/2011   Procedure: INSERTION OF ARTERIOVENOUS (AV) GORE-TEX GRAFT ARM;  Surgeon: Tinnie Gens, MD;  Location: [facility];  Service: Vascular;  Laterality: Left;  . BACK SURGERY  1998  . BASCILIC VEIN TRANSPOSITION Right 02/27/2013   Procedure: BASCILIC VEIN TRANSPOSITION;  Surgeon: Mal Misty, MD;  Location: [place];  Service: Vascular;  Laterality: Right;  Right Basilic Vein Transposition   . CARDIAC CATHETERIZATION N/A 08/06/2016   Procedure: Left Heart Cath and Coronary Angiography;  Surgeon: Jolaine Artist, MD;  Location: [place] CV LAB;  Service: Cardiovascular;  Laterality: N/A;  . CARDIAC CATHETERIZATION N/A 08/07/2016   Procedure: Coronary/Graft Atherectomy-CSI LAD;  Surgeon: Peter M Martinique, MD;  Location: [place] CV LAB;  Service: Cardiovascular;  Laterality: N/A;  . CERVICAL SPINE SURGERY  2/09   "to repair nerve problems in my left arm"  . CHOLECYSTECTOMY    . COLONOSCOPY WITH PROPOFOL N/A 04/26/2017   Procedure: COLONOSCOPY WITH PROPOFOL;  Surgeon: Otis Brace, MD;  Location: [facility];  Service: Gastroenterology;  Laterality: N/A;  . CORONARY ANGIOPLASTY WITH STENT PLACEMENT  06/11/2008  . CORONARY ANGIOPLASTY WITH STENT PLACEMENT  06/2007   TAXUS stent to RCA/notes 01/31/2011  . ESOPHAGOGASTRODUODENOSCOPY  09/28/2011   Procedure: ESOPHAGOGASTRODUODENOSCOPY (EGD);  Surgeon: Missy Sabins, MD;  Location: [facility] ENDOSCOPY;  Service: Endoscopy;  Laterality: N/A;  . ESOPHAGOGASTRODUODENOSCOPY N/A 04/07/2015   Procedure: ESOPHAGOGASTRODUODENOSCOPY (EGD);  Surgeon: Teena Irani, MD;  Location: [facility];  Service: Endoscopy;  Laterality: N/A;  . ESOPHAGOGASTRODUODENOSCOPY N/A 04/19/2015   Procedure: ESOPHAGOGASTRODUODENOSCOPY (EGD);  Surgeon: Arta Silence, MD;  Location: [facility] ENDOSCOPY;  Service: Endoscopy;  Laterality: N/A;  . ESOPHAGOGASTRODUODENOSCOPY (EGD) WITH  PROPOFOL N/A 06/13/2018   Procedure: ESOPHAGOGASTRODUODENOSCOPY (EGD) WITH PROPOFOL;  Surgeon: Wonda Horner, MD;  Location: [facility]COPY;  Service: Endoscopy;  Laterality: N/A;  . FLEXIBLE SIGMOIDOSCOPY N/A 05/21/2017   Procedure: Conni Elliot;  Surgeon: Clarene Essex, MD;  Location: [place];  Service: Endoscopy;  Laterality: N/A;  . FLEXIBLE SIGMOIDOSCOPY Left 07/02/2017   Procedure: FLEXIBLE SIGMOIDOSCOPY;  Surgeon: Laurence Spates, MD;  Location: [place];  Service: Endoscopy;  Laterality: Left;  . FOREIGN BODY REMOVAL  09/2003   via upper endoscopy/notes 02/12/2011  . GIVENS CAPSULE STUDY  09/30/2011   Procedure: GIVENS CAPSULE STUDY;  Surgeon: Jeryl Columbia, MD;  Location: [facility] ENDOSCOPY;  Service: Endoscopy;  Laterality: N/A;  . INSERTION OF DIALYSIS CATHETER Right 2014  . INSERTION OF DIALYSIS CATHETER Left 02/11/2013   Procedure: INSERTION OF DIALYSIS CATHETER;  Surgeon: Conrad Spartanburg, MD;  Location: [place];  Service: Vascular;  Laterality: Left;  Ultrasound guided  . LUMBAR LAMINECTOMY/DECOMPRESSION MICRODISCECTOMY Bilateral 07/31/2017   Procedure: LAMINECTOMY AND FORAMINOTOMY- BILATERAL LUMBAR TWO- LUMBAR THREE;  Surgeon: Earnie Larsson, MD;  Location: [place];  Service: Neurosurgery;  Laterality: Bilateral;  LAMINECTOMY AND FORAMINOTOMY- BILATERAL LUMBAR 2- LUMBAR 3  . PERIPHERAL VASCULAR BALLOON ANGIOPLASTY  04/07/2018   Procedure: PERIPHERAL VASCULAR BALLOON ANGIOPLASTY;  Surgeon: Waynetta Sandy, MD;  Location: [place] CV LAB;  Service: Cardiovascular;;  RUE AVF  . REMOVAL OF A DIALYSIS CATHETER Right 02/11/2013   Procedure: REMOVAL OF A DIALYSIS CATHETER;  Surgeon: Conrad Montmorenci, MD;  Location: [place];  Service: Vascular;  Laterality: Right;  . SAVORY DILATION N/A 04/07/2015   Procedure: SAVORY DILATION;  Surgeon: Teena Irani, MD;  Location: [facility] ENDOSCOPY;  Service: Endoscopy;  Laterality: N/A;  . SHUNTOGRAM N/A 09/20/2011   Procedure: Earney Mallet;  Surgeon: Conrad Lipscomb, MD;  Location: [facility] C[facility];  Service: Cardiovascular;  Laterality: N/A;  . SVT ABLATION N/A 11/26/2016   Procedure: SVT Ablation;  Surgeon: Will Meredith Leeds, MD;  Location: [place] CV LAB;  Service: Cardiovascular;  Laterality: N/A;  . TONSILLECTOMY    . TOTAL KNEE ARTHROPLASTY Right 08/02/2015   Procedure: TOTAL KNEE ARTHROPLASTY;  Surgeon: Renette Butters, MD;  Location: [place];  Service: Orthopedics;  Laterality: Right;  . VENOGRAM N/A 01/26/2013   Procedure: VENOGRAM;  Surgeon: Angelia Mould, MD;  Location: [facility] CATH LAB;  Service: Cardiovascular;  Laterality: N/A;        Home Medications    Prior to Admission medications   Medication Sig Start Date End Date Taking? Authorizing Provider  acetaminophen (TYLENOL) 325 MG tablet Take 650 mg by mouth every 6 (six) hours as needed for mild pain or headache.    Yes [provider]  Alirocumab (PRALUENT) 150 MG/ML SOPN Inject 150 mg into the skin every 14 (fourteen) days. 03/05/18  Yes Lorretta Harp, MD  allopurinol (ZYLOPRIM) 100 MG tablet TAKE ONE TABLET BY MOUTH ONCE DAILY Patient taking differently: Take 100 mg by mouth daily.  10/14/17  Yes Biagio Borg, MD  SENSIPAR 90 MG tablet Take 90 mg by mouth 2 (two) times daily. 09/18/16  Yes [provider]  aspirin EC 81 MG tablet Take 81 mg by mouth daily.    [provider]  betamethasone dipropionate (DIPROLENE) 0.05 % cream Apply 1 application topically daily as needed (leg sores).    [provider]  citalopram (CELEXA) 10 MG tablet Take 1 tablet (10 mg total) by mouth at bedtime. 10/14/17   Biagio Borg, MD  colchicine 0.6 MG tablet Take 1 tablet (0.6 mg total) by mouth daily. 08/08/18   Biagio Borg, MD  doxercalciferol ([name]) 4 MCG/2ML injection Inject 2 mLs (4 mcg total) into the vein Every Tuesday,Thursday,and Saturday with dialysis. 07/24/18   Hosie Poisson, MD  ezetimibe (ZETIA) 10 MG tablet TAKE ONE TABLET BY MOUTH ONCE DAILY  04/08/17   Bensimhon, Shaune Pascal, MD  ferric citrate (AURYXIA) 1 GM 210 MG(Fe) tablet Take 210-420 mg by mouth See admin instructions. Take 420 mg by mouth two times a day and 210 mg with each snack    [provider]  gabapentin (NEURONTIN) 300 MG capsule Take 1 capsule (300 mg total) by mouth at bedtime. Patient taking differently: Take 600 mg by mouth at bedtime.  07/05/17   Domenic Polite, MD  gabapentin (NEURONTIN) 300 MG capsule TAKE 1 TO 2 CAPSULES BY MOUTH AT BEDTIME AS NEEDED FOR PAIN AND FOR SLEEP Patient taking differently: Take 300-600 mg by mouth at bedtime as needed (for pain or sleep).  08/18/18   Biagio Borg, MD  hydroxypropyl methylcellulose / hypromellose (ISOPTO TEARS / GONIOVISC) 2.5 % ophthalmic solution Place 1 drop into both eyes 3 (three) times daily as needed for dry eyes. 05/03/17   Biagio Borg, MD  methimazole (TAPAZOLE) 5 MG tablet Take 0.5 tablets (2.5 mg total) by mouth daily. 05/16/18   Renato Shin, MD  multivitamin (RENA-VIT) TABS tablet Take 1 tablet by mouth daily. Reported on 10/24/2015    [provider]  pantoprazole (PROTONIX) 40 MG tablet Take 40 mg by mouth every evening.  08/31/16   [provider]  polyethylene glycol (MIRALAX / GLYCOLAX) packet Take 17 g by mouth daily. Patient taking differently: Take 17 g by mouth daily as needed for mild constipation.  05/23/17   Florencia Reasons, MD  promethazine (PHENERGAN) 25 MG tablet  08/19/18   [provider]  [employer] 625 MG tablet TAKE THREE TABLETS BY MOUTH TWICE DAILY WITH MEALS Patient taking differently: Take 1,875 mg by mouth 2 (two) times daily with a meal.  10/04/17   Biagio Borg, MD  zolpidem (AMBIEN) 5 MG tablet Take 1 tablet (5 mg total) by mouth at bedtime as needed. for sleep Patient taking differently: Take 5 mg by mouth at bedtime as needed for sleep.  06/20/18   Biagio Borg, MD    Family History Family History  Problem Relation Age of Onset  . Heart disease Sister   .  Thyroid nodules Sister   . Heart disease Father   . Diabetes Father   . Kidney failure Father   . Hypertension Father   . Healthy Child   . Healthy Child   . Healthy Child   . Cancer Neg Hx     Social History Social History   Tobacco Use  . Smoking status: Former Smoker    Packs/day: 1.00    Years: 25.00    Pack years: 25.00    Types: Cigarettes    Last attempt to quit: 10/01/2005    Years since quitting: 12.8  . Smokeless tobacco: Former Systems developer    Quit date: 10/01/2005  . Tobacco comment: Quit smoking 2007 Smoked x 25 years 1/2 ppd.  Substance Use Topics  . Alcohol use: No    Alcohol/week: 0.0 standard drinks  . Drug use: No     Allergies   Cephalexin; Statins; and Ciprofloxacin   Review of Systems Review of Systems  Unable to perform ROS: Mental status change     Physical Exam Updated Vital Signs BP (!) 85/58 (BP Location: Left Wrist)   Pulse 99   Temp 98.8 F (37.1 C) (Oral)   Resp 20   Ht 6\' 1"  (1.854 m)   Wt 99.8 kg   SpO2 95%   BMI 29.03 kg/m   Physical Exam  Constitutional: He appears well-developed and well-nourished. No distress.  HENT:  Head: Normocephalic and atraumatic.  Mouth/Throat: Oropharynx is clear and moist.  Slight twitching of jaw bilaterally when not awake.   Eyes: Pupils are equal, round, and reactive to light. Conjunctivae and EOM are normal.  Neck: Normal range of motion. Neck supple.  Cardiovascular: Normal rate, regular rhythm, normal heart sounds and intact distal pulses.  No murmur heard. Pulmonary/Chest: Effort normal and breath sounds normal. No respiratory distress.  Abdominal: Soft. Bowel sounds are normal. He exhibits no distension. There is no tenderness. There is no guarding.  Musculoskeletal: He exhibits no edema.  Is able to lift bilateral lower extremities off the bed.  5/5 grip strength bilaterally.  No obvious deformities.  No localized tenderness to palpation.  Neurological: He is alert. He has normal strength.  GCS eye subscore is 3. GCS verbal subscore is 4. GCS motor subscore is 6.  Smile is symmetrical, speech is not slurred.  Patient is drowsy, will awake to answer questions, he believes that it is the 1990s and that Tawni Pummel is president.  He is aware he is in the hospital and who he is.  Patient will then fall back asleep.   He does have slight twitching in his jaw when he is asleep.  Skin: Skin is warm and dry. He is not diaphoretic.  Psychiatric: He has a normal mood and affect.  Nursing note and vitals reviewed.    ED Treatments / Results  Labs (all labs ordered are listed, but only abnormal results are displayed) Labs Reviewed  COMPREHENSIVE METABOLIC PANEL - Abnormal; Notable for the following components:      Result Value   Potassium 5.3 (*)    Glucose, Bld 120 (*)    BUN 60 (*)    Creatinine, Ser 11.36 (*)    Albumin 3.1 (*)    ALT 49 (*)    Alkaline Phosphatase 128 (*)    GFR calc non Af Amer 4 (*)    GFR calc Af Amer 5 (*)    All other components within normal limits  CBC WITH DIFFERENTIAL/PLATELET - Abnormal; Notable for the following components:   WBC 14.6 (*)    Hemoglobin 12.6 (*)    MCV 100.2 (*)    MCHC 29.8 (*)    RDW 18.1 (*)    Neutro Abs 12.8 (*)    All other components within normal limits  I-STAT CHEM 8, ED - Abnormal; Notable for the following components:   Potassium 5.3 (*)    BUN 62 (*)    Creatinine, Ser 12.10 (*)    Glucose, Bld 117 (*)    Calcium, Ion 0.95 (*)    All other components within normal limits  I-STAT CG4 LACTIC ACID, ED - Abnormal; Notable for the following components:   Lactic Acid, Venous 2.76 (*)    All other components within normal limits  CBG MONITORING, ED - Abnormal; Notable for the following components:   Glucose-Capillary 111 (*)    All other components within normal limits  CULTURE, BLOOD (ROUTINE X 2)  CULTURE, BLOOD (ROUTINE X 2)  CBC WITH DIFFERENTIAL/PLATELET  I-STAT TROPONIN, ED  I-STAT CG4 LACTIC ACID, ED     EKG EKG Interpretation  Date/Time:  Tuesday August 19 2018 16:09:55 EST Ventricular Rate:  101 PR Interval:    QRS Duration: 101 QT Interval:  368 QTC Calculation: 477 R Axis:   43 Text Interpretation:  Sinus tachycardia Borderline prolonged QT interval No significant change since last tracing Confirmed by Dorie Rank [id number]) on 08/19/2018 4:17:10 PM   Radiology Ct Head Wo Contrast  Result Date: 08/19/2018 CLINICAL DATA:  Altered level of consciousness with fever. EXAM: CT HEAD WITHOUT CONTRAST TECHNIQUE: Contiguous axial images were obtained from the base of the skull through the vertex without intravenous contrast. COMPARISON:  07/16/2018 FINDINGS: Brain: Mild involutional changes of the brain with minimal small vessel ischemic disease of periventricular white matter. No large vascular territory infarct, hemorrhage or midline shift. No hydrocephalus. Midline fourth ventricle and basal cisterns without effacement. Brainstem and cerebellum appear nonacute. Vascular: No hyperdense vessel signs. Atherosclerosis of the carotid siphons bilaterally. Skull: Intact Sinuses/Orbits: No acute finding. Other: None.  IMPRESSION: Minimal small vessel ischemic disease of periventricular white matter. No acute intracranial abnormality. Electronically Signed   By: Ashley Royalty M.D.   On: 08/19/2018 18:45   Dg Chest Port 1 View  Result Date: 08/19/2018 CLINICAL DATA:  Altered mental status EXAM: PORTABLE CHEST 1 VIEW COMPARISON:  07/16/2018 FINDINGS: Cardiomegaly with vascular congestion. Bibasilar atelectasis. No visible effusions or acute bony abnormality. IMPRESSION: Cardiomegaly with vascular congestion and bibasilar atelectasis. Electronically Signed   By: Rolm Baptise M.D.   On: 08/19/2018 16:38    Procedures Procedures (including critical care time) CRITICAL CARE Performed by: Wyn Quaker Total critical care time: 40 minutes Critical care time was exclusive of separately billable  procedures and treating other patients. Critical care was necessary to treat or prevent imminent or life-threatening deterioration. Critical care was time spent personally by me on the following activities: development of treatment plan with patient and/or surrogate as well as nursing, discussions with consultants, evaluation of patient's response to treatment, examination of patient, obtaining history from patient or surrogate, ordering and performing treatments and interventions, ordering and review of laboratory studies, ordering and review of radiographic studies, pulse oximetry and re-evaluation of patient's condition.   Medications Ordered in ED Medications  vancomycin (VANCOCIN) 2,000 mg in sodium chloride 0.9 % 500 mL IVPB (2,000 mg Intravenous New Bag/Given 08/19/18 1845)  metroNIDAZOLE (FLAGYL) IVPB 500 mg (0 mg Intravenous Stopped 08/19/18 1854)  ceFEPIme (MAXIPIME) 1 g in sodium chloride 0.9 % 100 mL IVPB (has no administration in time range)  vancomycin (VANCOCIN) IVPB 1000 mg/200 mL premix (has no administration in time range)  sodium chloride 0.9 % bolus 2,000 mL (1,000 mLs Intravenous New Bag/Given 08/19/18 1852)  ceFEPIme (MAXIPIME) 2 g in sodium chloride 0.9 % 100 mL IVPB (0 g Intravenous Stopped 08/19/18 1802)  sodium chloride 0.9 % bolus 1,000 mL (1,000 mLs Intravenous New Bag/Given 08/19/18 1745)  acetaminophen (TYLENOL) suppository 650 mg (650 mg Rectal Given 08/19/18 1747)     Initial Impression / Assessment and Plan / ED Course  I have reviewed the triage vital signs and the nursing notes.  Pertinent labs & imaging results that were available during my care of the patient were reviewed by me and considered in my medical decision making (see chart for details).  Clinical Course as of Aug 19 1954  Tue Aug 19, 2018  1849 Patient reevaluated, wife is now in the room.  She tells me that patient did have a fall this morning, and was on the floor for approximately 1 hour.   CT head with out evidence of trauma   [EH]  1854 RN giving second liter bolus   [EH]  [place] with hospitalist who agreed to admit patient.   [EH]    Clinical Course User Index [EH] Lorin Glass, PA-C   Patient is a 67 year old man who presents today for evaluation of altered mental status.  He is generally not been feeling well over the past few days and had a fall this morning.  Patient's wife says patient has not been complaining of any localized pain or symptoms, just generally not feeling well.  In the emergency room he was febrile with a rectal temp of 102, tachycardic up to 102, and on approximately 2 L of oxygen.  Code sepsis was called and he was started on empiric antibiotics.  While he has issues with Keflex he has tolerated cefepime in the past, therefore cefepime was ordered.  His initial lactic was 2.76.  He  was started on 1 L of fluids due to his soft blood pressures which improved his pressure slightly however he began to trend back down again and therefore was given a second liter of fluid.  He is on 2 L of oxygen.  Chest x-ray significant for cardiomegaly with vascular congestion and bibasilar atelectasis, however given sepsis with hypotension, fluids were given.  His labs showed a potassium elevated at 5.3, however given fluids, to help dilute this, no EKG changes to indicate need for calcium.  White count is elevated at 14.6.  He was given Tylenol for his fever.  Due to his reported fall this morning CT head was obtained which did not show any evidence of acute abnormalities.  Patient was seen as a shared visit with Dr. Tomi Bamberger.  I spoke with the hospitalist Dr. Hal Hope who agreed to admit the patient.  Final Clinical Impressions(s) / ED Diagnoses   Final diagnoses:  Sepsis with encephalopathy without septic shock, due to unspecified organism [facility])  Altered mental status, unspecified altered mental status type  ESRD on hemodialysis [facility])    ED Discharge Orders     None       Ollen Gross 08/19/18 Allayne Stack, MD 08/22/18 1501

## 2018-08-19 NOTE — ED Triage Notes (Signed)
Pt was in the car with wife s/p HD when he became progressively alerted throughout the day.  Pt has hx of being septic.  Pt did not finish dialysis today because "he didn't feel well."  EMS also reports that pt wasn't;t feeling well prior to HD and left sided weakness that is normal.  No known hx of CVA.

## 2018-08-20 ENCOUNTER — Other Ambulatory Visit: Payer: Self-pay | Admitting: *Deleted

## 2018-08-20 ENCOUNTER — Inpatient Hospital Stay (HOSPITAL_COMMUNITY): Payer: Medicare Other

## 2018-08-20 ENCOUNTER — Ambulatory Visit: Payer: Self-pay | Admitting: *Deleted

## 2018-08-20 DIAGNOSIS — N186 End stage renal disease: Secondary | ICD-10-CM

## 2018-08-20 DIAGNOSIS — G9340 Encephalopathy, unspecified: Secondary | ICD-10-CM

## 2018-08-20 DIAGNOSIS — Z992 Dependence on renal dialysis: Secondary | ICD-10-CM

## 2018-08-20 DIAGNOSIS — I959 Hypotension, unspecified: Secondary | ICD-10-CM

## 2018-08-20 DIAGNOSIS — I255 Ischemic cardiomyopathy: Secondary | ICD-10-CM

## 2018-08-20 DIAGNOSIS — R7881 Bacteremia: Secondary | ICD-10-CM

## 2018-08-20 DIAGNOSIS — Z955 Presence of coronary angioplasty implant and graft: Secondary | ICD-10-CM

## 2018-08-20 DIAGNOSIS — E119 Type 2 diabetes mellitus without complications: Secondary | ICD-10-CM

## 2018-08-20 DIAGNOSIS — A4150 Gram-negative sepsis, unspecified: Secondary | ICD-10-CM

## 2018-08-20 DIAGNOSIS — A419 Sepsis, unspecified organism: Secondary | ICD-10-CM

## 2018-08-20 DIAGNOSIS — I5042 Chronic combined systolic (congestive) and diastolic (congestive) heart failure: Secondary | ICD-10-CM

## 2018-08-20 DIAGNOSIS — R4182 Altered mental status, unspecified: Secondary | ICD-10-CM

## 2018-08-20 DIAGNOSIS — R6520 Severe sepsis without septic shock: Secondary | ICD-10-CM

## 2018-08-20 LAB — BLOOD GAS, ARTERIAL
ACID-BASE DEFICIT: 3.7 mmol/L — AB (ref 0.0–2.0)
Bicarbonate: 19.7 mmol/L — ABNORMAL LOW (ref 20.0–28.0)
DRAWN BY: 535271
O2 Content: 3.5 L/min
O2 Saturation: 94.3 %
Patient temperature: 103.4
pCO2 arterial: 32.7 mmHg (ref 32.0–48.0)
pH, Arterial: 7.41 (ref 7.350–7.450)
pO2, Arterial: 82.3 mmHg — ABNORMAL LOW (ref 83.0–108.0)

## 2018-08-20 LAB — BLOOD CULTURE ID PANEL (REFLEXED)
ACINETOBACTER BAUMANNII: NOT DETECTED
CANDIDA ALBICANS: NOT DETECTED
CANDIDA TROPICALIS: NOT DETECTED
Candida glabrata: NOT DETECTED
Candida krusei: NOT DETECTED
Candida parapsilosis: NOT DETECTED
Carbapenem resistance: NOT DETECTED
ENTEROBACTER CLOACAE COMPLEX: NOT DETECTED
ENTEROBACTERIACEAE SPECIES: DETECTED — AB
ENTEROCOCCUS SPECIES: NOT DETECTED
Escherichia coli: NOT DETECTED
HAEMOPHILUS INFLUENZAE: NOT DETECTED
Klebsiella oxytoca: NOT DETECTED
Klebsiella pneumoniae: DETECTED — AB
LISTERIA MONOCYTOGENES: NOT DETECTED
NEISSERIA MENINGITIDIS: NOT DETECTED
PROTEUS SPECIES: NOT DETECTED
Pseudomonas aeruginosa: NOT DETECTED
STREPTOCOCCUS AGALACTIAE: NOT DETECTED
STREPTOCOCCUS PNEUMONIAE: NOT DETECTED
STREPTOCOCCUS SPECIES: NOT DETECTED
Serratia marcescens: NOT DETECTED
Staphylococcus aureus (BCID): NOT DETECTED
Staphylococcus species: NOT DETECTED
Streptococcus pyogenes: NOT DETECTED

## 2018-08-20 LAB — COMPREHENSIVE METABOLIC PANEL
ALT: 46 U/L — ABNORMAL HIGH (ref 0–44)
ANION GAP: 14 (ref 5–15)
AST: 39 U/L (ref 15–41)
Albumin: 2.5 g/dL — ABNORMAL LOW (ref 3.5–5.0)
Alkaline Phosphatase: 103 U/L (ref 38–126)
BUN: 67 mg/dL — ABNORMAL HIGH (ref 8–23)
CHLORIDE: 103 mmol/L (ref 98–111)
CO2: 21 mmol/L — ABNORMAL LOW (ref 22–32)
CREATININE: 11.49 mg/dL — AB (ref 0.61–1.24)
Calcium: 8.5 mg/dL — ABNORMAL LOW (ref 8.9–10.3)
GFR, EST AFRICAN AMERICAN: 5 mL/min — AB (ref >60)
GFR, EST NON AFRICAN AMERICAN: 4 mL/min — AB (ref >60)
Glucose, Bld: 168 mg/dL — ABNORMAL HIGH (ref 70–99)
POTASSIUM: 6.2 mmol/L — AB (ref 3.5–5.1)
Sodium: 138 mmol/L (ref 135–145)
Total Bilirubin: 0.8 mg/dL (ref 0.3–1.2)
Total Protein: 6.4 g/dL — ABNORMAL LOW (ref 6.5–8.1)

## 2018-08-20 LAB — CBC WITH DIFFERENTIAL/PLATELET
BASOS PCT: 0 %
Basophils Absolute: 0 10*3/uL (ref 0.0–0.1)
EOS ABS: 0 10*3/uL (ref 0.0–0.5)
Eosinophils Relative: 0 %
HCT: 33.5 % — ABNORMAL LOW (ref 39.0–52.0)
Hemoglobin: 10 g/dL — ABNORMAL LOW (ref 13.0–17.0)
Lymphocytes Relative: 8 %
Lymphs Abs: 1.8 10*3/uL (ref 0.7–4.0)
MCH: 28.9 pg (ref 26.0–34.0)
MCHC: 29.9 g/dL — AB (ref 30.0–36.0)
MCV: 96.8 fL (ref 80.0–100.0)
MONO ABS: 0.7 10*3/uL (ref 0.1–1.0)
MONOS PCT: 3 %
NEUTROS ABS: 19.5 10*3/uL — AB (ref 1.7–7.7)
Neutrophils Relative %: 75 %
PLATELETS: 236 10*3/uL (ref 150–400)
RBC: 3.46 MIL/uL — ABNORMAL LOW (ref 4.22–5.81)
RDW: 18.1 % — AB (ref 11.5–15.5)
WBC: 22.4 10*3/uL — ABNORMAL HIGH (ref 4.0–10.5)
nRBC: 0 % (ref 0.0–0.2)

## 2018-08-20 LAB — GLUCOSE, CAPILLARY
GLUCOSE-CAPILLARY: 148 mg/dL — AB (ref 70–99)
Glucose-Capillary: 133 mg/dL — ABNORMAL HIGH (ref 70–99)
Glucose-Capillary: 102 mg/dL — ABNORMAL HIGH (ref 70–99)
Glucose-Capillary: 68 mg/dL — ABNORMAL LOW (ref 70–99)
Glucose-Capillary: 81 mg/dL (ref 70–99)
Glucose-Capillary: 74 mg/dL (ref 70–99)

## 2018-08-20 LAB — TROPONIN I: TROPONIN I: 0.06 ng/mL — AB (ref <0.03)

## 2018-08-20 LAB — LACTIC ACID, PLASMA: LACTIC ACID, VENOUS: 1.8 mmol/L (ref 0.5–1.9)

## 2018-08-20 LAB — MRSA PCR SCREENING: MRSA by PCR: NEGATIVE

## 2018-08-20 MED ORDER — SODIUM CHLORIDE 0.9 % IV SOLN: 100.0000 mL | INTRAVENOUS | Status: DC | PRN

## 2018-08-20 MED ORDER — SODIUM BICARBONATE 8.4 % IV SOLN
50.0000 meq | Freq: Once | INTRAVENOUS | Status: AC
  Administered 2018-08-20: 50 meq via INTRAVENOUS
  Filled 2018-08-20: qty 50

## 2018-08-20 MED ORDER — SODIUM CHLORIDE 0.9 % IV SOLN
500.0000 mg | INTRAVENOUS | Status: DC
  Administered 2018-08-20: 500 mg via INTRAVENOUS
  Filled 2018-08-20 (×3): qty 0.5

## 2018-08-20 MED ORDER — ALBUMIN HUMAN 25 % IV SOLN
25.0000 g | Freq: Once | INTRAVENOUS | Status: AC
  Administered 2018-08-20: 25 g via INTRAVENOUS
  Filled 2018-08-20: qty 100

## 2018-08-20 MED ORDER — ORAL CARE MOUTH RINSE
15.0000 mL | Freq: Two times a day (BID) | OROMUCOSAL | Status: DC
  Administered 2018-08-20 – 2018-08-25 (×7): 15 mL via OROMUCOSAL

## 2018-08-20 MED ORDER — DEXTROSE 50 % IV SOLN
INTRAVENOUS | Status: AC
  Administered 2018-08-20: 25 mL
  Filled 2018-08-20: qty 50

## 2018-08-20 MED ORDER — HEPARIN SODIUM (PORCINE) 1000 UNIT/ML DIALYSIS
1000.0000 [IU] | INTRAMUSCULAR | Status: DC | PRN
  Administered 2018-08-20: 1000 [IU] via INTRAVENOUS_CENTRAL
  Filled 2018-08-20 (×3): qty 1

## 2018-08-20 MED ORDER — LIDOCAINE-PRILOCAINE 2.5-2.5 % EX CREA
1.0000 "application " | TOPICAL_CREAM | CUTANEOUS | Status: DC | PRN
  Filled 2018-08-20: qty 5

## 2018-08-20 MED ORDER — PENTAFLUOROPROP-TETRAFLUOROETH EX AERO: 1.0000 "application " | INHALATION_SPRAY | CUTANEOUS | Status: DC | PRN

## 2018-08-20 MED ORDER — CALCIUM GLUCONATE-NACL 1-0.675 GM/50ML-% IV SOLN
1.0000 g | Freq: Once | INTRAVENOUS | Status: AC
  Administered 2018-08-20: 1000 mg via INTRAVENOUS
  Filled 2018-08-20: qty 50

## 2018-08-20 MED ORDER — LORAZEPAM 2 MG/ML IJ SOLN
INTRAMUSCULAR | Status: AC
  Administered 2018-08-20: 2 mg
  Filled 2018-08-20: qty 1

## 2018-08-20 MED ORDER — CHLORHEXIDINE GLUCONATE CLOTH 2 % EX PADS
6.0000 | MEDICATED_PAD | Freq: Every day | CUTANEOUS | Status: DC
  Administered 2018-08-20: 6 via TOPICAL

## 2018-08-20 MED ORDER — SODIUM CHLORIDE 0.9 % IV SOLN
0.0000 ug/min | INTRAVENOUS | Status: DC
  Administered 2018-08-20: 250 ug/min via INTRAVENOUS
  Administered 2018-08-21: 125 ug/min via INTRAVENOUS
  Filled 2018-08-20: qty 4
  Filled 2018-08-20: qty 40
  Filled 2018-08-20 (×3): qty 4

## 2018-08-20 MED ORDER — HALOPERIDOL LACTATE 5 MG/ML IJ SOLN
INTRAMUSCULAR | Status: AC
  Administered 2018-08-20: 5 mg
  Filled 2018-08-20: qty 1

## 2018-08-20 MED ORDER — HEPARIN SODIUM (PORCINE) 1000 UNIT/ML IJ SOLN
1000.0000 [IU] | INTRAMUSCULAR | Status: DC | PRN
  Filled 2018-08-20: qty 6

## 2018-08-20 MED ORDER — ALTEPLASE 2 MG IJ SOLR: 2.0000 mg | Freq: Once | INTRAMUSCULAR | Status: DC | PRN

## 2018-08-20 MED ORDER — LIDOCAINE HCL (PF) 1 % IJ SOLN: 5.0000 mL | INTRAMUSCULAR | Status: DC | PRN

## 2018-08-20 MED ORDER — PATIROMER SORBITEX CALCIUM 8.4 G PO PACK
8.4000 g | PACK | Freq: Once | ORAL | Status: DC
  Filled 2018-08-20: qty 1

## 2018-08-20 MED ORDER — CALCIUM GLUCONATE 10 % IV SOLN: 1.0000 g | Freq: Once | INTRAVENOUS | Status: DC

## 2018-08-20 MED ORDER — SODIUM CHLORIDE 0.9 % IV SOLN
2.0000 g | INTRAVENOUS | Status: DC
  Filled 2018-08-20: qty 20

## 2018-08-20 MED ORDER — PHENYLEPHRINE HCL-NACL 10-0.9 MG/250ML-% IV SOLN
0.0000 ug/min | INTRAVENOUS | Status: DC
  Administered 2018-08-20 (×2): 200 ug/min via INTRAVENOUS
  Administered 2018-08-20: 50 ug/min via INTRAVENOUS
  Administered 2018-08-20: 225 ug/min via INTRAVENOUS
  Administered 2018-08-20: 200 ug/min via INTRAVENOUS
  Administered 2018-08-20: 125 ug/min via INTRAVENOUS
  Administered 2018-08-20 (×2): 200 ug/min via INTRAVENOUS
  Administered 2018-08-20 (×2): 250 ug/min via INTRAVENOUS
  Filled 2018-08-20 (×10): qty 250

## 2018-08-20 MED ORDER — SODIUM CHLORIDE 0.9 % IV BOLUS
250.0000 mL | Freq: Once | INTRAVENOUS | Status: AC | PRN
  Administered 2018-08-20: 250 mL via INTRAVENOUS

## 2018-08-20 MED ORDER — COLCHICINE 0.6 MG PO TABS: 0.6000 mg | ORAL_TABLET | ORAL | Status: DC

## 2018-08-20 NOTE — Consult Note (Addendum)
NAME:  [name], MRN:  [id number], DOB:  02/16/1951, LOS: 1 ADMISSION DATE:  08/19/2018, CONSULTATION DATE: 08/20/2018 REFERRING MD: [facility]ad, CHIEF COMPLAINT: Hypotension  Brief History   67 year old end-stage renal disease fevers chills abdominal pain  History of present illness   67 year old male with extensive past medical history is well-documented who was in his normal state of poor health until 08/19/2018 at which time he was undergoing dialysis in outpatient setting.  He is unable to complete secondary to abdominal pain and low blood pressure.  He was taken at home but on the way home require admission to the hospital for continued fevers abdominal pain.  Noted to have a fever of 104.  Positive for chills.  Hospitalization was positive for bacteremia. 08/20/2018 he was taken to CT scan for abdominal CT and on return he was noted to be hypertensive with systolic blood pressure 70.  Pulmonary critical care was consulted at that time.  His blood pressure low he would follow commands and move all extremities.  Right bicep AV graft was noted to be pulsatile at time of examination.  He was transferred to intensive care unit where he may require CVVH and/or intubation in the future.  Past Medical History  End-stage renal disease Ischemic cardiomyopathy Diabetes mellitus Hypertension Morbid obesity Onychogryphosis   Significant Hospital Events   08/19/2018 admission 08/20/2018 after returning from CT scan was found to be hypotensive transferred to ICU  Consults:  08/20/2018 PCCM #[id number] 2019 renal  Procedures:    Significant Diagnostic Tests:  08/20/2018 CT of the abdomen>> no acute process is identified Micro Data:  08/19/2018 blood cultures x2>> 1 with Klebsiella 1 with gram-negative rods>>  Antimicrobials:  08/20/2018 meropenem 08/19/2018 vancomycin  Interim history/subjective:  67 year old end-stage renal disease with a plethora of health issues 24 hours of abdominal pain  fever admitted subsequently became hypotensive while on CT scan.  Objective   Blood pressure (!) 70/44, pulse (!) 110, temperature (!) 103.4 F (39.7 C), temperature source Axillary, resp. rate 20, height 6' (1.829 m), weight 101.7 kg, SpO2 96 %.        Intake/Output Summary (Last 24 hours) at 08/20/2018 1136 Last data filed at 08/19/2018 1854 Gross per 24 hour  Intake 200 ml  Output -  Net 200 ml   Filed Weights   08/19/18 1724 08/19/18 2117 08/20/18 0547  Weight: 99.8 kg 100.9 kg 101.7 kg    Examination: General: Obese male whose arousable to noxious stimuli HENT: No JVD or lymphadenopathy is appreciated Lungs: Decreased breath sounds in the bases Cardiovascular: Heart sounds are distant but regular Abdomen: Obese, soft, tender, positive bowel sounds Extremities: Lower extremities with multiple areas of scarring, right arm bicep AV graft with positive bruit and thrill. Neuro: Encephalopathic but responsive to noxious stimuli GU: Anuric  Resolved Hospital Problem list     Assessment & Plan:  Shock in the setting of abdominal pain, high fevers of 104, chills and recent history of bacteremia.  Procalcitonin noted to be elevated 28 History of hypertension holding all antihypertensive at this time secondary to shock Transferred to intensive care unit May need hemodialysis catheter for CVVH and administration of pressors Antibiotics as noted prescribed by pharmacy Sepsis protocol Fluid resuscitation with caution and end-stage renal patient  History of ischemic cardiomyopathy note latest 2D echo May need cardiology consult in future  Bacteremia Meropenem and vancomycin day 1 Follow culture data Questionable need to repeat 2D echo last one showed no vegetation. Transfer to intensive care  unit Questionable ID consult  End-stage renal disease unable to complete hemodialysis on Tuesday, 08/19/2018 due to abdominal pain and discomfort. Currently hypotensive with systolic  blood pressure of 70 may need hemodialysis catheter placed for CVVH Nephrology is following Questionable placement of temporary hemodialysis catheter for CVVH please nonresponsive to fluid resuscitation.  Diabetes mellitus Sliding scale insulin  History of dementia Monitor mental state Avoid sedation medication  Best practice:  Diet: N.p.o. Pain/Anxiety/Delirium protocol (if indicated): As indicated VAP protocol (if indicated): None indicated at this time DVT prophylaxis: Subcu heparin GI prophylaxis: PPI Glucose control: None scale insulin Mobility: Bedrest Code Status: Full code Family Communication: 08/20/2018 no family at bedside Disposition: Transfer to intensive care unit  Labs   CBC: Recent Labs  Lab 08/19/18 1708 08/19/18 1716 08/19/18 2124 08/20/18 0257  WBC 14.6*  --  17.1* 22.4*  NEUTROABS 12.8*  --   --  19.5*  HGB 12.6* 13.3 11.3* 10.0*  HCT 42.3 39.0 35.8* 33.5*  MCV 100.2*  --  96.2 96.8  PLT 227  --  216 147    Basic Metabolic Panel: Recent Labs  Lab 08/19/18 1618 08/19/18 1716 08/19/18 2124 08/20/18 0257  NA 139 136 138 138  K 5.3* 5.3* 5.5* 6.2*  CL 100 107 103 103  CO2 24  --  21* 21*  GLUCOSE 120* 117* 139* 168*  BUN 60* 62* 63* 67*  CREATININE 11.36* 12.10* 11.32* 11.49*  CALCIUM 9.4  --  8.4* 8.5*   GFR: Estimated Creatinine Clearance: 7.5 mL/min (A) (by C-G formula based on SCr of 11.49 mg/dL (H)). Recent Labs  Lab 08/19/18 1708 08/19/18 1716 08/19/18 2124 08/20/18 0017 08/20/18 0257  PROCALCITON  --   --  20.50  --   --   WBC 14.6*  --  17.1*  --  22.4*  LATICACIDVEN  --  2.76* 1.8 1.8  --     Liver Function Tests: Recent Labs  Lab 08/19/18 1618 08/20/18 0257  AST 41 39  ALT 49* 46*  ALKPHOS 128* 103  BILITOT 0.9 0.8  PROT 7.6 6.4*  ALBUMIN 3.1* 2.5*   No results for input(s): LIPASE, AMYLASE in the last 168 hours. No results for input(s): AMMONIA in the last 168 hours.  ABG    Component Value Date/Time    PHART 7.410 08/20/2018 1113   PCO2ART 32.7 08/20/2018 1113   PO2ART 82.3 (L) 08/20/2018 1113   HCO3 19.7 (L) 08/20/2018 1113   TCO2 28 08/19/2018 1716   ACIDBASEDEF 3.7 (H) 08/20/2018 1113   O2SAT 94.3 08/20/2018 1113     Coagulation Profile: Recent Labs  Lab 08/19/18 2124  INR 1.15    Cardiac Enzymes: Recent Labs  Lab 08/19/18 2124 08/20/18 0257  TROPONINI 0.06* 0.06*    HbA1C: Hemoglobin A1C  Date/Time Value Ref Range Status  05/16/2018 10:20 AM 6.3 (A) 4.0 - 5.6 % Final  01/06/2018 02:45 PM 6.2  Final   Hgb A1c MFr Bld  Date/Time Value Ref Range Status  07/16/2018 10:35 PM 5.7 (H) 4.8 - 5.6 % Final    Comment:    (NOTE) Pre diabetes:          5.7%-6.4% Diabetes:              >6.4% Glycemic control for   <7.0% adults with diabetes   07/02/2017 07:30 AM 4.8 4.8 - 5.6 % Final    Comment:    (NOTE) Pre diabetes:          5.7%-6.4% Diabetes:              >  6.4% Glycemic control for   <7.0% adults with diabetes     CBG: Recent Labs  Lab 08/19/18 1633 08/19/18 2128 08/20/18 0606 08/20/18 1101 08/20/18 1124  GLUCAP 111* 137* 133* 68* 102*    Review of Systems:   Not available secondary to confusion encephalopathic state  Past Medical History  He,  has a past medical history of Allergic rhinitis, cause unspecified (02/24/2014), Anemia (06/16/2011), BENIGN PROSTATIC HYPERTROPHY (10/14/2009), CAD, NATIVE VESSEL (02/06/2009), Cervical radiculopathy, chronic (02/23/2016), Chronic combined systolic (congestive) and diastolic (congestive) heart failure ([place]), COLONIC POLYPS, HX OF (10/14/2009), Dementia ([place]) ([id number]), Depression (09/24/2015), DIABETES MELLITUS, TYPE II (02/01/2010), DYSLIPIDEMIA (06/18/2007), ESRD (end stage renal disease) on dialysis ([place]) (08/04/2010), FOOT PAIN (08/12/2008), GAIT DISTURBANCE (03/03/2010), GASTROENTERITIS, VIRAL (10/14/2009), GERD (06/18/2007), GOITER, MULTINODULAR (12/26/2007), GOUT (06/18/2007), GYNECOMASTIA (07/17/2010), Hemodialysis  access, fistula mature ([place]), Hyperlipidemia (10/16/2011), Hyperparathyroidism, secondary ([place]) (06/16/2011), HYPERTENSION (06/18/2007), Hyperthyroidism, Hypocalcemia (06/07/2010), Ischemic cardiomyopathy, Lumbar stenosis with neurogenic claudication, ONYCHOMYCOSIS, TOENAILS (12/26/2007), OSA on CPAP (10/16/2011), Other malaise and fatigue (11/24/2009), PERIPHERAL NEUROPATHY (06/18/2007), Prostate cancer ([place]), PSVT (paroxysmal supraventricular tachycardia) ([place]), PULMONARY NODULE, RIGHT LOWER LOBE (06/08/2009), Sleep apnea, TRANSAMINASES, SERUM, ELEVATED (02/01/2010), Transfusion history, and Unspecified hypotension (01/30/2010).   Surgical History    Past Surgical History:  Procedure Laterality Date  . A/V SHUNTOGRAM N/A 04/07/2018   Procedure: A/V SHUNTOGRAM;  Surgeon: Waynetta Sandy, MD;  Location: [place] CV [facility];  Service: Cardiovascular;  Laterality: N/A;  . ARTERIOVENOUS GRAFT PLACEMENT Right 2009   forearm/notes 02/01/2011  . AV FISTULA PLACEMENT  11/07/2011   Procedure: INSERTION OF ARTERIOVENOUS (AV) GORE-TEX GRAFT ARM;  Surgeon: Tinnie Gens, MD;  Location: [place];  Service: Vascular;  Laterality: Left;  . BACK SURGERY  1998  . BASCILIC VEIN TRANSPOSITION Right 02/27/2013   Procedure: BASCILIC VEIN TRANSPOSITION;  Surgeon: Mal Misty, MD;  Location: [place];  Service: Vascular;  Laterality: Right;  Right Basilic Vein Transposition   . CARDIAC CATHETERIZATION N/A 08/06/2016   Procedure: Left Heart Cath and Coronary Angiography;  Surgeon: Jolaine Artist, MD;  Location: [place] [facility];  Service: Cardiovascular;  Laterality: N/A;  . CARDIAC CATHETERIZATION N/A 08/07/2016   Procedure: Coronary/Graft Atherectomy-CSI LAD;  Surgeon: Peter M Martinique, MD;  Location: [place] [facility];  Service: Cardiovascular;  Laterality: N/A;  . CERVICAL SPINE SURGERY  2/09   "to repair nerve problems in my left arm"  . CHOLECYSTECTOMY    . COLONOSCOPY WITH PROPOFOL N/A 04/26/2017   Procedure: COLONOSCOPY  WITH PROPOFOL;  Surgeon: Otis Brace, MD;  Location: [place];  Service: Gastroenterology;  Laterality: N/A;  . CORONARY ANGIOPLASTY WITH STENT PLACEMENT  06/11/2008  . CORONARY ANGIOPLASTY WITH STENT PLACEMENT  06/2007   TAXUS stent to RCA/notes 01/31/2011  . ESOPHAGOGASTRODUODENOSCOPY  09/28/2011   Procedure: ESOPHAGOGASTRODUODENOSCOPY (EGD);  Surgeon: Missy Sabins, MD;  Location: [facility] ENDOSCOPY;  Service: Endoscopy;  Laterality: N/A;  . ESOPHAGOGASTRODUODENOSCOPY N/A 04/07/2015   Procedure: ESOPHAGOGASTRODUODENOSCOPY (EGD);  Surgeon: Teena Irani, MD;  Location[facility];  Service: Endoscopy;  Laterality: N/A;  . ESOPHAGOGASTRODUODENOSCOPY N/A 04/19/2015   Procedure: ESOPHAGOGASTRODUODENOSCOPY (EGD);  Surgeon: Arta Silence, MD;  Location[facility]COPY;  Service: Endoscopy;  Laterality: N/A;  . ESOPHAGOGASTRODUODENOSCOPY (EGD) WITH PROPOFOL N/A 06/13/2018   Procedure: ESOPHAGOGASTRODUODENOSCOPY (EGD) WITH PROPOFOL;  Surgeon: Wonda Horner, MD;  Location: [facility] ENDOSCOPY;  Service: Endoscopy;  Laterality: N/A;  . FLEXIBLE SIGMOIDOSCOPY N/A 05/21/2017   Procedure: Conni Elliot;  Surgeon: Clarene Essex, MD;  Location: [place];  Service: Endoscopy;  Laterality:  N/A;  . FLEXIBLE SIGMOIDOSCOPY Left 07/02/2017   Procedure: FLEXIBLE SIGMOIDOSCOPY;  Surgeon: Laurence Spates, MD;  Location: [place];  Service: Endoscopy;  Laterality: Left;  . FOREIGN BODY REMOVAL  09/2003   via upper endoscopy/notes 02/12/2011  . GIVENS CAPSULE STUDY  09/30/2011   Procedure: GIVENS CAPSULE STUDY;  Surgeon: Jeryl Columbia, MD;  Location: [facility] ENDOSCOPY;  Service: Endoscopy;  Laterality: N/A;  . INSERTION OF DIALYSIS CATHETER Right 2014  . INSERTION OF DIALYSIS CATHETER Left 02/11/2013   Procedure: INSERTION OF DIALYSIS CATHETER;  Surgeon: Conrad Eitzen, MD;  Location: [place];  Service: Vascular;  Laterality: Left;  Ultrasound guided  . LUMBAR LAMINECTOMY/DECOMPRESSION MICRODISCECTOMY Bilateral 07/31/2017     Procedure: LAMINECTOMY AND FORAMINOTOMY- BILATERAL LUMBAR TWO- LUMBAR THREE;  Surgeon: Earnie Larsson, MD;  Location: [place];  Service: Neurosurgery;  Laterality: Bilateral;  LAMINECTOMY AND FORAMINOTOMY- BILATERAL LUMBAR 2- LUMBAR 3  . PERIPHERAL VASCULAR BALLOON ANGIOPLASTY  04/07/2018   Procedure: PERIPHERAL VASCULAR BALLOON ANGIOPLASTY;  Surgeon: Waynetta Sandy, MD;  Location: [place] [facility] LAB;  Service: Cardiovascular;;  RUE AVF  . REMOVAL OF A DIALYSIS CATHETER Right 02/11/2013   Procedure: REMOVAL OF A DIALYSIS CATHETER;  Surgeon: Conrad [place], MD;  Location: [place];  Service: Vascular;  Laterality: Right;  . SAVORY DILATION N/A 04/07/2015   Procedure: SAVORY DILATION;  Surgeon: Teena Irani, MD;  Location: [facility] ENDOSCOPY;  Service: Endoscopy;  Laterality: N/A;  . SHUNTOGRAM N/A 09/20/2011   Procedure: Earney Mallet;  Surgeon: Conrad Calpine, MD;  Location: [facility] CATH LAB;  Service: Cardiovascular;  Laterality: N/A;  . SVT ABLATION N/A 11/26/2016   Procedure: SVT Ablation;  Surgeon: Will Meredith Leeds, MD;  Location: [place] CV LAB;  Service: Cardiovascular;  Laterality: N/A;  . TONSILLECTOMY    . TOTAL KNEE ARTHROPLASTY Right 08/02/2015   Procedure: TOTAL KNEE ARTHROPLASTY;  Surgeon: Renette Butters, MD;  Location: [place];  Service: Orthopedics;  Laterality: Right;  . VENOGRAM N/A 01/26/2013   Procedure: VENOGRAM;  Surgeon: Angelia Mould, MD;  Location: [facility] CATH LAB;  Service: Cardiovascular;  Laterality: N/A;     Social History   reports that he quit smoking about 12 years ago. His smoking use included cigarettes. He has a 25.00 pack-year smoking history. He quit smokeless tobacco use about 12 years ago. He reports that he does not drink alcohol or use drugs.   Family History   His family history includes Diabetes in his father; Healthy in his child, child, and child; Heart disease in his father and sister; Hypertension in his father; Kidney failure in his father; Thyroid nodules in  his sister. There is no history of Cancer.   Allergies Allergies  Allergen Reactions  . Cephalexin Swelling and Other (See Comments)    Tolerated Rocephin 10/19. Tongue swelling, but no breathing issues Tolerated Zosyn in Sept 2019 without any issues  . Statins Other (See Comments)    Weak muscles  . Ciprofloxacin Rash     Home Medications  Prior to Admission medications   Medication Sig Start Date End Date Taking? Authorizing Provider  acetaminophen (TYLENOL) 325 MG tablet Take 650 mg by mouth every 6 (six) hours as needed for mild pain or headache.    Yes [provider]  Alirocumab (PRALUENT) 150 MG/ML SOPN Inject 150 mg into the skin every 14 (fourteen) days. 03/05/18  Yes Lorretta Harp, MD  allopurinol (ZYLOPRIM) 100 MG tablet TAKE ONE TABLET BY MOUTH ONCE DAILY Patient taking differently:  Take 100 mg by mouth daily.  10/14/17  Yes Biagio Borg, MD  aspirin EC 81 MG tablet Take 81 mg by mouth daily.   Yes [provider]  betamethasone dipropionate (DIPROLENE) 0.05 % cream Apply 1 application topically daily as needed (leg sores).   Yes [provider]  citalopram (CELEXA) 10 MG tablet Take 1 tablet (10 mg total) by mouth at bedtime. 10/14/17  Yes Biagio Borg, MD  colchicine 0.6 MG tablet Take 1 tablet (0.6 mg total) by mouth daily. 08/08/18  Yes Biagio Borg, MD  doxercalciferol ([name]) 4 MCG/2ML injection Inject 2 mLs (4 mcg total) into the vein Every Tuesday,Thursday,and Saturday with dialysis. 07/24/18  Yes Hosie Poisson, MD  ezetimibe (ZETIA) 10 MG tablet TAKE ONE TABLET BY MOUTH ONCE DAILY Patient taking differently: Take 10 mg by mouth daily.  04/08/17  Yes Bensimhon, Shaune Pascal, MD  ferric citrate (AURYXIA) 1 GM 210 MG(Fe) tablet Take 210-420 mg by mouth See admin instructions. Take 420 mg by mouth two times a day and 210 mg with each snack   Yes [provider]  gabapentin (NEURONTIN) 300 MG capsule Take 1 capsule (300 mg total) by  mouth at bedtime. Patient taking differently: Take 600 mg by mouth at bedtime.  07/05/17  Yes Domenic Polite, MD  hydroxypropyl methylcellulose / hypromellose (ISOPTO TEARS / GONIOVISC) 2.5 % ophthalmic solution Place 1 drop into both eyes 3 (three) times daily as needed for dry eyes. 05/03/17  Yes Biagio Borg, MD  methimazole (TAPAZOLE) 5 MG tablet Take 0.5 tablets (2.5 mg total) by mouth daily. 05/16/18  Yes Renato Shin, MD  multivitamin (RENA-VIT) TABS tablet Take 1 tablet by mouth daily. Reported on 10/24/2015   Yes [provider]  pantoprazole (PROTONIX) 40 MG tablet Take 40 mg by mouth every evening.  08/31/16  Yes [provider]  polyethylene glycol (MIRALAX / GLYCOLAX) packet Take 17 g by mouth daily. Patient taking differently: Take 17 g by mouth daily as needed for mild constipation.  05/23/17  Yes Florencia Reasons, MD  SENSIPAR 90 MG tablet Take 90 mg by mouth 2 (two) times daily. 09/18/16  Yes [provider]  WELCHOL 625 MG tablet TAKE THREE TABLETS BY MOUTH TWICE DAILY WITH MEALS Patient taking differently: Take 1,875 mg by mouth 2 (two) times daily with a meal.  10/04/17  Yes Biagio Borg, MD  zolpidem (AMBIEN) 5 MG tablet Take 1 tablet (5 mg total) by mouth at bedtime as needed. for sleep Patient taking differently: Take 5 mg by mouth at bedtime as needed for sleep.  06/20/18  Yes Biagio Borg, MD  gabapentin (NEURONTIN) 300 MG capsule TAKE 1 TO 2 CAPSULES BY MOUTH AT BEDTIME AS NEEDED FOR PAIN AND FOR SLEEP Patient not taking: No sig reported 08/18/18   Biagio Borg, MD     Critical care time: 45 min    Richardson Landry Natesha Hassey ACNP Mary[facility] Shape PCCM Pager [phone] till 1 pm If no answer page [phone] 08/20/2018, 11:37 AM

## 2018-08-20 NOTE — Progress Notes (Signed)
Charge RN Silva Bandy called Dialysis unit to let them know that the patient was here in ICU -

## 2018-08-20 NOTE — Consult Note (Addendum)
Reason for Consult: To manage dialysis and dialysis related needs Referring Physician: [name] is an 67 y.o. male.  HPI: Pt is a 38M with ESRD on HD, CAD, h/o Klebsiella bacteremia 07/2018, CAP 06/2018 who is now seen in consultation at the request of Dr. Jonnie Finner for management of HD and provision of ESRD.  Pt started having n/v yesterday at dialysis and requested phenergan which was escribed.  Completed only 1 hr 48 min of rx.  On the way home he had abd pain, weakness and was unable to get out of the car.  He was brought to ED by wife for eval.    In ED, pt was found to be febrile and hypotensive.  Got 2L IVFs.  Blood cultures taken, vanc/ cefepime started.  Cultures + already for Klebsiella  K 6.2, BUN 67, Cr 11, WBC ct 22.4, Hgb 10.0. CT head negative, CXR with atalectasis.  Was supposed to have [facility] appt today.      Dialysis orders:  [facility] 4 hrs  EDW 98.5 kg 2K/ 2.25 Ca bath F180 dialyzer UF profile 2 No heparin UF Profile 2 AVF  Past Medical History:  Diagnosis Date  . Allergic rhinitis, cause unspecified 02/24/2014  . Anemia 06/16/2011  . BENIGN PROSTATIC HYPERTROPHY 10/14/2009  . CAD, NATIVE VESSEL 02/06/2009   a. 06/2007 s/p Taxus DES to the RCA;  b. 08/2016 NSTEMI in setting of SVT/PCI: LM 30ost, LAD 34m(3.0x16 Synergy DES), LCX 457mOM1 60, RI 40, RCA 70p/m, 8052mnot amenable to PCI.  . CMarland Kitchenrvical radiculopathy, chronic 02/23/2016   Right c5-6 by NCS/EMG  . Chronic combined systolic (congestive) and diastolic (congestive) heart failure (HCCDe Witt  a. 10/2016 Echo: EF 40-45%, Gr2 DD. mildly dil LA.  . CMarland KitchenLONIC POLYPS, HX OF 10/14/2009  . Dementia (HCCGarden City [id number] Depression 09/24/2015  . DIABETES MELLITUS, TYPE II 02/01/2010  . DYSLIPIDEMIA 06/18/2007  . ESRD (end stage renal disease) on dialysis (HCCLecanto1/12/2009   ESRD due to DM adn HTN, started HD in 2011. As of 2019 has a R arm graft and gets HD on TTS sched  . FOOT PAIN 08/12/2008  . GAIT DISTURBANCE  03/03/2010  . GASTROENTERITIS, VIRAL 10/14/2009  . GERD 06/18/2007  . GOITER, MULTINODULAR 12/26/2007  . GOUT 06/18/2007  . GYNECOMASTIA 07/17/2010  . Hemodialysis access, fistula mature ([facility]  D[facility] ([place] upper arm fistula  . Hyperlipidemia 10/16/2011  . Hyperparathyroidism, secondary (HCCHooper/15/2012  . HYPERTENSION 06/18/2007  . Hyperthyroidism   . Hypocalcemia 06/07/2010  . Ischemic cardiomyopathy    a. 10/2016 Echo: EF 40-45%.  . Lumbar stenosis with neurogenic claudication   . ONYCHOMYCOSIS, TOENAILS 12/26/2007  . OSA on CPAP 10/16/2011  . Other malaise and fatigue 11/24/2009  . PERIPHERAL NEUROPATHY 06/18/2007  . Prostate cancer (HCCWallowa . PSVT (paroxysmal supraventricular tachycardia) (HCCHartland  a. 11/39/0300mplicated by NSTEMI;  b. 11/2016 Treated w/ adenosine in ED;  c. 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  . PMarland KitchenLMONARY NODULE, RIGHT LOWER LOBE 06/08/2009  . Sleep apnea    cpap machine and o2  . TRANSAMINASES, SERUM, ELEVATED 02/01/2010  . Transfusion history    none recent  . Unspecified hypotension 01/30/2010    Past Surgical History:  Procedure Laterality Date  . A/V SHUNTOGRAM N/A 04/07/2018   Procedure: A/V SHUNTOGRAM;  Surgeon: CaiWaynetta SandyD;  [facility];  Service: Cardiovascular;  Laterality:  N/A;  . ARTERIOVENOUS GRAFT PLACEMENT Right 2009   forearm/notes 02/01/2011  . AV FISTULA PLACEMENT  11/07/2011   Procedure: INSERTION OF ARTERIOVENOUS (AV) GORE-TEX GRAFT ARM;  Surgeon: Tinnie Gens, MD;  Location: [place];  Service: Vascular;  Laterality: Left;  . BACK SURGERY  1998  . BASCILIC VEIN TRANSPOSITION Right 02/27/2013   Procedure: BASCILIC VEIN TRANSPOSITION;  Surgeon: Mal Misty, MD;  Location: [place];  Service: Vascular;  Laterality: Right;  Right Basilic Vein Transposition   . CARDIAC CATHETERIZATION N/A 08/06/2016   Procedure: Left Heart Cath and Coronary Angiography;  Surgeon: Jolaine Artist, MD;  Location: [place] [facility];  Service: Cardiovascular;  Laterality: N/A;  . CARDIAC CATHETERIZATION N/A 08/07/2016   Procedure: Coronary/Graft Atherectomy-CSI LAD;  Surgeon: Peter M Martinique, MD;  Location: [place] [facility];  Service: Cardiovascular;  Laterality: N/A;  . CERVICAL SPINE SURGERY  2/09   "to repair nerve problems in my left arm"  . CHOLECYSTECTOMY    . COLONOSCOPY WITH PROPOFOL N/A 04/26/2017   Procedure: COLONOSCOPY WITH PROPOFOL;  Surgeon: Otis Brace, MD;  Location: [place];  Service: Gastroenterology;  Laterality: N/A;  . CORONARY ANGIOPLASTY WITH STENT PLACEMENT  06/11/2008  . CORONARY ANGIOPLASTY WITH STENT PLACEMENT  06/2007   TAXUS stent to RCA/notes 01/31/2011  . ESOPHAGOGASTRODUODENOSCOPY  09/28/2011   Procedure: ESOPHAGOGASTRODUODENOSCOPY (EGD);  Surgeon: Missy Sabins, MD;  Location[facility] ENDOSCOPY;  Service: Endoscopy;  Laterality: N/A;  . ESOPHAGOGASTRODUODENOSCOPY N/A 04/07/2015   Procedure: ESOPHAGOGASTRODUODENOSCOPY (EGD);  Surgeon: Teena Irani, MD;  [facility];  Service: Endoscopy;  Laterality: N/A;  . ESOPHAGOGASTRODUODENOSCOPY N/A 04/19/2015   Procedure: ESOPHAGOGASTRODUODENOSCOPY (EGD);  Surgeon: Arta Silence, MD;  Location: [facility] ENDOSCOPY;  Service: Endoscopy;  Laterality: N/A;  . ESOPHAGOGASTRODUODENOSCOPY (EGD) WITH PROPOFOL N/A 06/13/2018   Procedure: ESOPHAGOGASTRODUODENOSCOPY (EGD) WITH PROPOFOL;  Surgeon: Wonda Horner, MD;  Location: [facility] ENDOSCOPY;  Service: Endoscopy;  Laterality: N/A;  . FLEXIBLE SIGMOIDOSCOPY N/A 05/21/2017   Procedure: Conni Elliot;  Surgeon: Clarene Essex, MD;  Location: [place];  Service: Endoscopy;  Laterality: N/A;  . FLEXIBLE SIGMOIDOSCOPY Left 07/02/2017   Procedure: FLEXIBLE SIGMOIDOSCOPY;  Surgeon: Laurence Spates, MD;  Location: [place];  Service: Endoscopy;  Laterality: Left;  . FOREIGN BODY REMOVAL  09/2003   via upper endoscopy/notes 02/12/2011  . GIVENS CAPSULE STUDY  09/30/2011   Procedure: GIVENS  CAPSULE STUDY;  Surgeon: Jeryl Columbia, MD;  Location: [facility] END[facility]COPY;  Service: Endoscopy;  Laterality: N/A;  . INSERTION OF DIALYSIS CATHETER Right 2014  . INSERTION OF DIALYSIS CATHETER Left 02/11/2013   Procedure: INSERTION OF DIALYSIS CATHETER;  Surgeon: Conrad [place], MD;  Location: [place];  Service: Vascular;  Laterality: Left;  Ultrasound guided  . LUMBAR LAMINECTOMY/DECOMPRESSION MICRODISCECTOMY Bilateral 07/31/2017   Procedure: LAMINECTOMY AND FORAMINOTOMY- BILATERAL LUMBAR TWO- LUMBAR THREE;  Surgeon: Earnie Larsson, MD;  Location: [place];  Service: Neurosurgery;  Laterality: Bilateral;  LAMINECTOMY AND FORAMINOTOMY- BILATERAL LUMBAR 2- LUMBAR 3  . PERIPHERAL VASCULAR BALLOON ANGIOPLASTY  04/07/2018   Procedure: PERIPHERAL VASCULAR BALLOON ANGIOPLASTY;  Surgeon: Waynetta Sandy, MD;  Location: [place] [facility] LAB;  Service: Cardiovascular;;  RUE AVF  . REMOVAL OF A DIALYSIS CATHETER Right 02/11/2013   Procedure: REMOVAL OF A DIALYSIS CATHETER;  Surgeon: Conrad [place], MD;  Location: [place];  Service: Vascular;  Laterality: Right;  . SAVORY DILATION N/A 04/07/2015   Procedure: SAVORY DILATION;  Surgeon: Teena Irani, MD;  Location[facility] ENDOSCOPY;  Service: Endoscopy;  Laterality: N/A;  . SHUNTOGRAM N/A 09/20/2011   Procedure: Earney Mallet;  Surgeon: Conrad [place], MD;  Location: [facility] C[facility] LAB;  Service: Cardiovascular;  Laterality: N/A;  . SVT ABLATION N/A 11/26/2016   Procedure: SVT Ablation;  Surgeon: Will Meredith Leeds, MD;  Location: [place] [facility];  Service: Cardiovascular;  Laterality: N/A;  . TONSILLECTOMY    . TOTAL KNEE ARTHROPLASTY Right 08/02/2015   Procedure: TOTAL KNEE ARTHROPLASTY;  Surgeon: Renette Butters, MD;  Location: [place];  Service: Orthopedics;  Laterality: Right;  . VENOGRAM N/A 01/26/2013   Procedure: VENOGRAM;  Surgeon: Angelia Mould, MD;  Location: [facility] CATH LAB;  Service: Cardiovascular;  Laterality: N/A;    Family History  Problem Relation Age of Onset  . Heart  disease Sister   . Thyroid nodules Sister   . Heart disease Father   . Diabetes Father   . Kidney failure Father   . Hypertension Father   . Healthy Child   . Healthy Child   . Healthy Child   . Cancer Neg Hx     Social History:  reports that he quit smoking about 12 years ago. His smoking use included cigarettes. He has a 25.00 pack-year smoking history. He quit smokeless tobacco use about 12 years ago. He reports that he does not drink alcohol or use drugs.  Allergies:  Allergies  Allergen Reactions  . Cephalexin Swelling and Other (See Comments)    Tolerated Rocephin 10/19. Tongue swelling, but no breathing issues Tolerated Zosyn in Sept 2019 without any issues  . Statins Other (See Comments)    Weak muscles  . Ciprofloxacin Rash    Medications:  Scheduled: . allopurinol  100 mg Oral Daily  . aspirin EC  81 mg Oral Daily  . Chlorhexidine Gluconate Cloth  6 each Topical Q0600  . cinacalcet  90 mg Oral BID WC  . citalopram  10 mg Oral QHS  . [START ON 08/22/2018] colchicine  0.6 mg Oral Once per day on Mon Wed Fri  . colesevelam  1,875 mg Oral BID WC  . ezetimibe  10 mg Oral Daily  . heparin  5,000 Units Subcutaneous Q8H  . insulin aspart  0-9 Units Subcutaneous TID WC  . methimazole  2.5 mg Oral Daily  . multivitamin  1 tablet Oral Daily  . pantoprazole  40 mg Oral QPM  . patiromer  8.4 g Oral Once  . sodium bicarbonate  50 mEq Intravenous Once     Results for orders placed or performed during the hospital encounter of 08/19/18 (from the past 48 hour(s))  Comprehensive metabolic panel     Status: Abnormal   Collection Time: 08/19/18  4:18 PM  Result Value Ref Range   Sodium 139 135 - 145 mmol/L   Potassium 5.3 (H) 3.5 - 5.1 mmol/L   Chloride 100 98 - 111 mmol/L   CO2 24 22 - 32 mmol/L   Glucose, Bld 120 (H) 70 - 99 mg/dL   BUN 60 (H) 8 - 23 mg/dL   Creatinine, Ser 11.36 (H) 0.61 - 1.24 mg/dL   Calcium 9.4 8.9 - 10.3 mg/dL   Total Protein 7.6 6.5 - 8.1 g/dL    Albumin 3.1 (L) 3.5 - 5.0 g/dL   AST 41 15 - 41 U/L   ALT 49 (H) 0 - 44 U/L   Alkaline Phosphatase 128 (H) 38 - 126 U/L   Total Bilirubin 0.9 0.3 - 1.2 mg/dL   GFR calc non Af Amer 4 (  L) >60 mL/min   GFR calc Af Amer 5 (L) >60 mL/min    Comment: (NOTE) The eGFR has been calculated using the CKD EPI equation. This calculation has not been validated in all clinical situations. eGFR's persistently <60 mL/min signify possible Chronic Kidney Disease.    Anion gap 15 5 - 15    Comment: Performed at [facility] [address], [place], [place] [address]  CBG monitoring, ED     Status: Abnormal   Collection Time: 08/19/18  4:33 PM  Result Value Ref Range   Glucose-Capillary 111 (H) 70 - 99 mg/dL  Culture, blood (routine x 2)     Status: None (Preliminary result)   Collection Time: 08/19/18  5:07 PM  Result Value Ref Range   Specimen Description BLOOD RIGHT ANTECUBITAL    Special Requests      BOTTLES DRAWN AEROBIC AND ANAEROBIC Blood Culture adequate volume   Culture  Setup Time      GRAM NEGATIVE RODS IN BOTH AEROBIC AND ANAEROBIC BOTTLES CRITICAL RESULT CALLED TO, READ BACK BY AND VERIFIED WITHKarsten Ro [facility] 2800 08/20/18 A BROWNING Performed at [facility], [place] [address] [place], [place] [address]    Culture PENDING    Report Status PENDING   CBC with Differential/Platelet     Status: Abnormal   Collection Time: 08/19/18  5:08 PM  Result Value Ref Range   WBC 14.6 (H) 4.0 - 10.5 K/uL    Comment: WHITE COUNT CONFIRMED ON SMEAR   RBC 4.22 4.22 - 5.81 MIL/uL   Hemoglobin 12.6 (L) 13.0 - 17.0 g/dL   HCT 42.3 39.0 - 52.0 %   MCV 100.2 (H) 80.0 - 100.0 fL   MCH 29.9 26.0 - 34.0 pg   MCHC 29.8 (L) 30.0 - 36.0 g/dL   RDW 18.1 (H) 11.5 - 15.5 %   Platelets 227 150 - 400 K/uL   nRBC 0.0 0.0 - 0.2 %   Neutrophils Relative % 88 %   Lymphocytes Relative 6 %   Monocytes Relative 6 %   Eosinophils Relative 0 %   Basophils Relative 0 %   Band Neutrophils 0 %    Metamyelocytes Relative 0 %   Myelocytes 0 %   Promyelocytes Relative 0 %   Blasts 0 %   nRBC 0 0 /100 WBC   Other 0 %   Neutro Abs 12.8 (H) 1.7 - 7.7 K/uL   Lymphs Abs 0.9 0.7 - 4.0 K/uL   Monocytes Absolute 0.9 0.1 - 1.0 K/uL   Eosinophils Absolute 0.0 0.0 - 0.5 K/uL   Basophils Absolute 0.0 0.0 - 0.1 K/uL   RBC Morphology POLYCHROMASIA PRESENT    WBC Morphology INCREASED BANDS (>20% BANDS)     Comment: VACUOLATED NEUTROPHILS Performed at [facility], [address], [place], [place] [address]   I-stat troponin, ED     Status: None   Collection Time: 08/19/18  5:14 PM  Result Value Ref Range   Troponin i, poc 0.07 0.00 - 0.08 ng/mL   Comment 3            Comment: Due to the release kinetics of cTnI, a negative result within the first hours of the onset of symptoms does not rule out myocardial infarction with certainty. If myocardial infarction is still suspected, repeat the test at appropriate intervals.   I-stat Chem 8, ED     Status: Abnormal   Collection Time: 08/19/18  5:16 PM  Result Value Ref Range   Sodium 136 135 - 145 mmol/L   Potassium 5.3 (H) 3.5 - 5.1 mmol/L   Chloride 107 98 - 111 mmol/L   BUN 62 (H) 8 - 23 mg/dL   Creatinine, Ser 12.10 (H) 0.61 - 1.24 mg/dL   Glucose, Bld 117 (H) 70 - 99 mg/dL   Calcium, Ion 0.95 (L) 1.15 - 1.40 mmol/L   TCO2 28 22 - 32 mmol/L   Hemoglobin 13.3 13.0 - 17.0 g/dL   HCT 39.0 39.0 - 52.0 %  I-Stat CG4 Lactic Acid, ED     Status: Abnormal   Collection Time: 08/19/18  5:16 PM  Result Value Ref Range   Lactic Acid, Venous 2.76 (HH) 0.5 - 1.9 mmol/L   Comment NOTIFIED PHYSICIAN   Culture, blood (routine x 2)     Status: None (Preliminary result)   Collection Time: 08/19/18  5:23 PM  Result Value Ref Range   Specimen Description BLOOD RIGHT ARM    Special Requests      BOTTLES DRAWN AEROBIC AND ANAEROBIC Blood Culture results may not be optimal due to an inadequate volume of blood received in culture bottles   Culture   Setup Time      GRAM NEGATIVE RODS IN BOTH AEROBIC AND ANAEROBIC BOTTLES Organism ID to follow CRITICAL RESULT CALLED TO, READ BACK BY AND VERIFIED WITHKarsten Ro [facility] [id number] 08/20/18 A BROWNING Performed at [facility], [address] [place], [place] [address]    Culture PENDING    Report Status PENDING   Blood Culture ID Panel (Reflexed)     Status: Abnormal   Collection Time: 08/19/18  5:23 PM  Result Value Ref Range   Enterococcus species NOT DETECTED NOT DETECTED   Listeria monocytogenes NOT DETECTED NOT DETECTED   Staphylococcus species NOT DETECTED NOT DETECTED   Staphylococcus aureus (BCID) NOT DETECTED NOT DETECTED   Streptococcus species NOT DETECTED NOT DETECTED   Streptococcus agalactiae NOT DETECTED NOT DETECTED   Streptococcus pneumoniae NOT DETECTED NOT DETECTED   Streptococcus pyogenes NOT DETECTED NOT DETECTED   Acinetobacter baumannii NOT DETECTED NOT DETECTED   Enterobacteriaceae species DETECTED (A) NOT DETECTED    Comment: Enterobacteriaceae represent a large family of gram-negative bacteria, not a single organism. CRITICAL RESULT CALLED TO, READ BACK BY AND VERIFIED WITH: J [facility] PHARMD [id number] 08/20/18 A BROWNING    Enterobacter cloacae complex NOT DETECTED NOT DETECTED   Escherichia coli NOT DETECTED NOT DETECTED   Klebsiella oxytoca NOT DETECTED NOT DETECTED   Klebsiella pneumoniae DETECTED (A) NOT DETECTED    Comment: CRITICAL RESULT CALLED TO, READ BACK BY AND VERIFIED WITH: Karsten Ro PHARMD [id number] 08/20/18 A BROWNING    Proteus species NOT DETECTED NOT DETECTED   Serratia marcescens NOT DETECTED NOT DETECTED   Carbapenem resistance NOT DETECTED NOT DETECTED   Haemophilus influenzae NOT DETECTED NOT DETECTED   Neisseria meningitidis NOT DETECTED NOT DETECTED   Pseudomonas aeruginosa NOT DETECTED NOT DETECTED   Candida albicans NOT DETECTED NOT DETECTED   Candida glabrata NOT DETECTED NOT DETECTED   Candida krusei NOT DETECTED NOT DETECTED    Candida parapsilosis NOT DETECTED NOT DETECTED   Candida tropicalis NOT DETECTED NOT DETECTED    Comment: Performed at [facility] [address], [place], [place] [address]  Lactic acid, plasma     Status: None   Collection Time: 08/19/18  9:24 PM  Result Value Ref Range   Lactic Acid, Venous 1.8 0.5 -  1.9 mmol/L    Comment: Performed at [facility], [place] [address] [place], [place] [address]  Procalcitonin     Status: None   Collection Time: 08/19/18  9:24 PM  Result Value Ref Range   Procalcitonin 20.50 ng/mL    Comment:        Interpretation: PCT >= 10 ng/mL: Important systemic inflammatory response, almost exclusively due to severe bacterial sepsis or septic shock. (NOTE)       Sepsis PCT Algorithm           Lower Respiratory Tract                                      Infection PCT Algorithm    ----------------------------     ----------------------------         PCT < 0.25 ng/mL                PCT < 0.10 ng/mL         Strongly encourage             Strongly discourage   discontinuation of antibiotics    initiation of antibiotics    ----------------------------     -----------------------------       PCT 0.25 - 0.50 ng/mL            PCT 0.10 - 0.25 ng/mL               OR       >80% decrease in PCT            Discourage initiation of                                            antibiotics      Encourage discontinuation           of antibiotics    ----------------------------     -----------------------------         PCT >= 0.50 ng/mL              PCT 0.26 - 0.50 ng/mL                AND       <80% decrease in PCT             Encourage initiation of                                             antibiotics       Encourage continuation           of antibiotics    ----------------------------     -----------------------------        PCT >= 0.50 ng/mL                  PCT > 0.50 ng/mL               AND         increase in PCT                  Strongly  encourage  initiation of antibiotics    Strongly encourage escalation           of antibiotics                                     -----------------------------                                           PCT <= 0.25 ng/mL                                                 OR                                        > 80% decrease in PCT                                     Discontinue / Do not initiate                                             antibiotics Performed at [facility], [address] [place], [place] [address]   Protime-INR     Status: None   Collection Time: 08/19/18  9:24 PM  Result Value Ref Range   Prothrombin Time 14.6 11.4 - 15.2 seconds   INR 1.15     Comment: Performed at [facility] [address], [place], [place] [address]  APTT     Status: None   Collection Time: 08/19/18  9:24 PM  Result Value Ref Range   aPTT 35 24 - 36 seconds    Comment: Performed at [facility] [address], [place], [place] [address]  CBC     Status: Abnormal   Collection Time: 08/19/18  9:24 PM  Result Value Ref Range   WBC 17.1 (H) 4.0 - 10.5 K/uL   RBC 3.72 (L) 4.22 - 5.81 MIL/uL   Hemoglobin 11.3 (L) 13.0 - 17.0 g/dL   HCT 35.8 (L) 39.0 - 52.0 %   MCV 96.2 80.0 - 100.0 fL   MCH 30.4 26.0 - 34.0 pg   MCHC 31.6 30.0 - 36.0 g/dL   RDW 18.0 (H) 11.5 - 15.5 %   Platelets 216 150 - 400 K/uL   nRBC 0.0 0.0 - 0.2 %    Comment: Performed at [facility], [place] [name], [place], [place] [address]  Troponin I - Now Then Q6H     Status: Abnormal   Collection Time: 08/19/18  9:24 PM  Result Value Ref Range   Troponin I 0.06 (HH) <0.03 ng/mL    Comment: CRITICAL RESULT CALLED TO, READ BACK BY AND VERIFIED WITH: LABANG,S RN 08/19/2018 [id number] JORDANS Performed at [facility], [place] [address] [place], [place] [address]   Basic metabolic panel     Status: Abnormal   Collection Time: 08/19/18  9:24 PM  Result Value Ref  Range   Sodium 138 135 - 145 mmol/L   Potassium 5.5 (H) 3.5 - 5.1 mmol/L   Chloride 103 98 - 111 mmol/L   CO2 21 (L) 22 - 32 mmol/L   Glucose, Bld 139 (H) 70 - 99 mg/dL   BUN 63 (H) 8 - 23 mg/dL   Creatinine, Ser 11.32 (H) 0.61 - 1.24 mg/dL   Calcium 8.4 (L) 8.9 - 10.3 mg/dL   GFR calc non Af Amer 4 (L) >60 mL/min   GFR calc Af Amer 5 (L) >60 mL/min    Comment: (NOTE) The eGFR has been calculated using the CKD EPI equation. This calculation has not been validated in all clinical situations. eGFR's persistently <60 mL/min signify possible Chronic Kidney Disease.    Anion gap 14 5 - 15    Comment: Performed at [facility] [address] [place], [place] [address]  Glucose, capillary     Status: Abnormal   Collection Time: 08/19/18  9:28 PM  Result Value Ref Range   Glucose-Capillary 137 (H) 70 - 99 mg/dL   Comment 1 Notify RN    Comment 2 Document in Chart   Lactic acid, plasma     Status: None   Collection Time: 08/20/18 12:17 AM  Result Value Ref Range   Lactic Acid, Venous 1.8 0.5 - 1.9 mmol/L    Comment: Performed at [facility] [address], [place], [place] [address]  CBC with Differential     Status: Abnormal   Collection Time: 08/20/18  2:57 AM  Result Value Ref Range   WBC 22.4 (H) 4.0 - 10.5 K/uL   RBC 3.46 (L) 4.22 - 5.81 MIL/uL   Hemoglobin 10.0 (L) 13.0 - 17.0 g/dL   HCT 33.5 (L) 39.0 - 52.0 %   MCV 96.8 80.0 - 100.0 fL   MCH 28.9 26.0 - 34.0 pg   MCHC 29.9 (L) 30.0 - 36.0 g/dL   RDW 18.1 (H) 11.5 - 15.5 %   Platelets 236 150 - 400 K/uL   nRBC 0.0 0.0 - 0.2 %   Neutrophils Relative % 75 %   Neutro Abs 19.5 (H) 1.7 - 7.7 K/uL   Lymphocytes Relative 8 %   Lymphs Abs 1.8 0.7 - 4.0 K/uL   Monocytes Relative 3 %   Monocytes Absolute 0.7 0.1 - 1.0 K/uL   Eosinophils Relative 0 %   Eosinophils Absolute 0.0 0.0 - 0.5 K/uL   Basophils Relative 0 %   Basophils Absolute 0.0 0.0 - 0.1 K/uL   WBC Morphology MILD LEFT SHIFT (1-5% METAS, OCC MYELO,  OCC BANDS)    RBC Morphology BURR CELLS     Comment: PSYCHOLOGY TEARDROP CELLS Performed at [facility] [address], [place], [place] [address]   Comprehensive metabolic panel     Status: Abnormal   Collection Time: 08/20/18  2:57 AM  Result Value Ref Range   Sodium 138 135 - 145 mmol/L   Potassium 6.2 (H) 3.5 - 5.1 mmol/L    Comment: DELTA CHECK NOTED   Chloride 103 98 - 111 mmol/L   CO2 21 (L) 22 - 32 mmol/L   Glucose, Bld 168 (H) 70 - 99 mg/dL   BUN 67 (H) 8 - 23 mg/dL   Creatinine, Ser 11.49 (H) 0.61 - 1.24 mg/dL   Calcium 8.5 (L) 8.9 - 10.3 mg/dL   Total Protein 6.4 (L) 6.5 - 8.1 g/dL   Albumin  2.5 (L) 3.5 - 5.0 g/dL   AST 39 15 - 41 U/L   ALT 46 (H) 0 - 44 U/L   Alkaline Phosphatase 103 38 - 126 U/L   Total Bilirubin 0.8 0.3 - 1.2 mg/dL   GFR calc non Af Amer 4 (L) >60 mL/min   GFR calc Af Amer 5 (L) >60 mL/min    Comment: (NOTE) The eGFR has been calculated using the CKD EPI equation. This calculation has not been validated in all clinical situations. eGFR's persistently <60 mL/min signify possible Chronic Kidney Disease.    Anion gap 14 5 - 15    Comment: Performed at [facility] [address] [place], [place] [address]  Troponin I - Now Then Q6H     Status: Abnormal   Collection Time: 08/20/18  2:57 AM  Result Value Ref Range   Troponin I 0.06 (HH) <0.03 ng/mL    Comment: CRITICAL VALUE NOTED.  VALUE IS CONSISTENT WITH PREVIOUSLY REPORTED AND CALLED VALUE. Performed at [facility], [place] [address], [place], [place] [address]   Glucose, capillary     Status: Abnormal   Collection Time: 08/20/18  6:06 AM  Result Value Ref Range   Glucose-Capillary 133 (H) 70 - 99 mg/dL   Comment 1 Notify RN    Comment 2 Document in Chart     Ct Abdomen Pelvis Wo Contrast  Result Date: 08/20/2018 CLINICAL DATA:  Abdominal pain, some nausea and vomiting, poor historian EXAM: CT ABDOMEN AND PELVIS WITHOUT CONTRAST TECHNIQUE: Multidetector CT imaging  of the abdomen and pelvis was performed following the standard protocol without IV contrast. COMPARISON:  CT abdomen pelvis of 07/16/2018 FINDINGS: Lower chest: There is considerable motion through the lung bases obscuring detail. No obvious pneumonia or pleural effusion is evident. There is mild cardiomegaly which appears stable. Hepatobiliary: The liver is unremarkable in the unenhanced state. Some air is noted within the ductal system most likely due to prior cholecystectomy and probable sphincterotomy. Pancreas: The pancreas is normal in size and the pancreatic duct is not dilated. Spleen: The spleen is unremarkable. Adrenals/Urinary Tract: The adrenal glands appear stable and normal. The kidneys are small and there are multiple low-attenuation structures on this unenhanced study most consistent with renal cysts. Also there is renovascular calcification and probable small renal calculi bilaterally. No hydronephrosis is evident currently. The ureters appear normal in caliber. The urinary bladder is decompressed completely and cannot be evaluated. Stomach/Bowel: The stomach is somewhat decompressed. No abnormality is evident. No small bowel dilatation or edema is seen. The colon is largely decompressed and there are scattered diverticula present. No definite diverticulitis is seen. The terminal ileum is unremarkable. The appendix is better seen on the coronal images and is normal. No inflammatory process is seen. Vascular/Lymphatic: The abdominal aorta is normal in caliber with significant abdominal aortic atherosclerosis present. No adenopathy is seen. Reproductive: The prostate is small and there are surgical clips posterior to the prostate. Other: No abdominal wall hernia is noted. Musculoskeletal: Hardware for fusion is noted at L4-5 with interbody ray cage fusion at L3-4. There are significant degenerative changes throughout the lumbar spine. IMPRESSION: 1. No definite explanation for the patient's abdominal  pain is seen. There are scattered colonic diverticula present but no diverticulitis is noted. 2. The appendix and terminal ileum are unremarkable. 3. Significant abdominal aortic atherosclerosis. 4. Probable small renal cysts, difficult to assess on this unenhanced study, with renovascular calcifications and probable small nonobstructing renal calculi. Electronically  Signed   By: Ivar Drape M.D.   On: 08/20/2018 10:22   Ct Head Wo Contrast  Result Date: 08/19/2018 CLINICAL DATA:  Altered level of consciousness with fever. EXAM: CT HEAD WITHOUT CONTRAST TECHNIQUE: Contiguous axial images were obtained from the base of the skull through the vertex without intravenous contrast. COMPARISON:  07/16/2018 FINDINGS: Brain: Mild involutional changes of the brain with minimal small vessel ischemic disease of periventricular white matter. No large vascular territory infarct, hemorrhage or midline shift. No hydrocephalus. Midline fourth ventricle and basal cisterns without effacement. Brainstem and cerebellum appear nonacute. Vascular: No hyperdense vessel signs. Atherosclerosis of the carotid siphons bilaterally. Skull: Intact Sinuses/Orbits: No acute finding. Other: None. IMPRESSION: Minimal small vessel ischemic disease of periventricular white matter. No acute intracranial abnormality. Electronically Signed   By: Ashley Royalty M.D.   On: 08/19/2018 18:45   Dg Chest Port 1 View  Result Date: 08/19/2018 CLINICAL DATA:  Altered mental status EXAM: PORTABLE CHEST 1 VIEW COMPARISON:  07/16/2018 FINDINGS: Cardiomegaly with vascular congestion. Bibasilar atelectasis. No visible effusions or acute bony abnormality. IMPRESSION: Cardiomegaly with vascular congestion and bibasilar atelectasis. Electronically Signed   By: Rolm Baptise M.D.   On: 08/19/2018 16:38    ROS: reports chills Blood pressure (!) 84/39, pulse (!) 115, temperature (!) 103 F (39.4 C), temperature source Oral, resp. rate (!) 28, height 6' (1.829 m),  weight 101.7 kg, SpO2 100 %. .  GEN appears ill HEENT eyes closed, EOMI NECK no JVD PULM clear anteriorly, muffled bases CV tachycardic, II/VI systolic murmur ABD mild diffuse tenderness EXT 1+ LE edema NEURO confused ACCESS RUE AVF + T/B.  Mult failed accesses  Assessment/Plan: 1 Severe sepsis secondary to klebsiella bacteremia: recurrent bacteremia.  ? Source.  Getting CT abd/pelvis.  On broad spectrum antibiotics.  [place] of fistula pending.  Recent TTE without veggies, ? If needs TEE if rest of workup unrevealing. Per primary.    2 ESRD: TTS.  Hold BP meds at present, hypotensive 4. Anemia of ESRD: no ESA 5. Metabolic Bone Disease: hold binders for now 6.  Nutrition: renal diet when eating 7.  Dispo: to be moved to ICU per primary team  Madelon Lips 08/20/2018, 10:43 AM

## 2018-08-20 NOTE — Progress Notes (Signed)
Pt refused a cpap tonight

## 2018-08-20 NOTE — Procedures (Addendum)
Patient seen and examined on Hemodialysis. QB 350 mL/ min via AVF UF goal even.  Hypotensive with increasing pressor requirement.  Has gotten 2 hrs of HD.  Terminate rx, will need CRRT for further dialytic intervention.   Given recurrent bacteremia with Klebsiella, possible source is fistula.  Appreciate VVS c/s.  Madelon Lips MD [place] pgr [phone] 3:45 PM

## 2018-08-20 NOTE — Progress Notes (Signed)
Patient axillary tem 103.4. Patient bp 70/44 Dr. Jonnie Finner in room orders received. Will monitor patient. Kristyna Bradstreet, Bettina Gavia RN

## 2018-08-20 NOTE — Consult Note (Addendum)
Hospital Consult    Reason for Consult:  Suspected infection of R arm AV fistula Requesting Physician:  Dr. Lynetta Mare MRN #:  [id number]  History of Present Illness: This is a 67 y.o. male seen in consultation to evaluate right arm for suspected AV fistula infection.  He was transferred to the ICU due to hypotension related to Klebsiella pneumoniae bacteremia.  On exam patient is restrained and sedated.  He is also currently dialyzing from right arm AV fistula.  Patient has had several dialysis access surgeries in the past including left arm fistula as well as right arm forearm loop graft.    Past Medical History:  Diagnosis Date  . Allergic rhinitis, cause unspecified 02/24/2014  . Anemia 06/16/2011  . BENIGN PROSTATIC HYPERTROPHY 10/14/2009  . CAD, NATIVE VESSEL 02/06/2009   a. 06/2007 s/p Taxus DES to the RCA;  b. 08/2016 NSTEMI in setting of SVT/PCI: LM 30ost, LAD 67m (3.0x16 Synergy DES), LCX 30m, OM1 60, RI 40, RCA 70p/m, 41m - not amenable to PCI.  [name] Cervical radiculopathy, chronic 02/23/2016   Right c5-6 by NCS/EMG  . Chronic combined systolic (congestive) and diastolic (congestive) heart failure ([place])    a. 10/2016 Echo: EF 40-45%, Gr2 DD. mildly dil LA.  [name] COLONIC POLYPS, HX OF 10/14/2009  . Dementia ([place]) [id number]  . Depression 09/24/2015  . DIABETES MELLITUS, TYPE II 02/01/2010  . DYSLIPIDEMIA 06/18/2007  . ESRD (end stage renal disease) on dialysis ([place]) 08/04/2010   ESRD due to DM adn HTN, started HD in 2011. As of 2019 has a R arm graft and gets HD on TTS sched  . FOOT PAIN 08/12/2008  . GAIT DISTURBANCE 03/03/2010  . GASTROENTERITIS, VIRAL 10/14/2009  . GERD 06/18/2007  . GOITER, MULTINODULAR 12/26/2007  . GOUT 06/18/2007  . GYNECOMASTIA 07/17/2010  . Hemodialysis access, fistula mature [facility])    Dialysis T-Th-Sa ([place]) Right upper arm fistula  . Hyperlipidemia 10/16/2011  . Hyperparathyroidism, secondary ([place]) 06/16/2011  . HYPERTENSION 06/18/2007  .  Hyperthyroidism   . Hypocalcemia 06/07/2010  . Ischemic cardiomyopathy    a. 10/2016 Echo: EF 40-45%.  . Lumbar stenosis with neurogenic claudication   . ONYCHOMYCOSIS, TOENAILS 12/26/2007  . OSA on CPAP 10/16/2011  . Other malaise and fatigue 11/24/2009  . PERIPHERAL NEUROPATHY 06/18/2007  . Prostate cancer ([place])   . PSVT (paroxysmal supraventricular tachycardia) ([place])    a. 88/5027 complicated by NSTEMI;  b. 11/2016 Treated w/ adenosine in ED;  c. 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  [name] PULMONARY NODULE, RIGHT LOWER LOBE 06/08/2009  . Sleep apnea    cpap machine and o2  . TRANSAMINASES, SERUM, ELEVATED 02/01/2010  . Transfusion history    none recent  . Unspecified hypotension 01/30/2010    Past Surgical History:  Procedure Laterality Date  . A/V SHUNTOGRAM N/A 04/07/2018   Procedure: A/V SHUNTOGRAM;  Surgeon: Waynetta Sandy, MD;  Location: [place] [facility];  Service: Cardiovascular;  Laterality: N/A;  . ARTERIOVENOUS GRAFT PLACEMENT Right 2009   forearm/notes 02/01/2011  . AV FISTULA PLACEMENT  11/07/2011   Procedure: INSERTION OF ARTERIOVENOUS (AV) GORE-TEX GRAFT ARM;  Surgeon: Tinnie Gens, MD;  Location: [place];  Service: Vascular;  Laterality: Left;  . BACK SURGERY  1998  . BASCILIC VEIN TRANSPOSITION Right 02/27/2013   Procedure: BASCILIC VEIN TRANSPOSITION;  Surgeon: Mal Misty, MD;  Location: [place];  Service: Vascular;  Laterality: Right;  Right Basilic Vein Transposition   . CARDIAC CATHETERIZATION N/A 08/06/2016  Procedure: Left Heart Cath and Coronary Angiography;  Surgeon: Jolaine Artist, MD;  Location: [place] CV LAB;  Service: Cardiovascular;  Laterality: N/A;  . CARDIAC CATHETERIZATION N/A 08/07/2016   Procedure: Coronary/Graft Atherectomy-CSI LAD;  Surgeon: Peter M Martinique, MD;  Location: [place] CV LAB;  Service: Cardiovascular;  Laterality: N/A;  . CERVICAL SPINE SURGERY  2/09   "to repair nerve problems in my left arm"  . CHOLECYSTECTOMY    . COLONOSCOPY WITH  PROPOFOL N/A 04/26/2017   Procedure: COLONOSCOPY WITH PROPOFOL;  Surgeon: Otis Brace, MD;  Location: [place];  Service: Gastroenterology;  Laterality: N/A;  . CORONARY ANGIOPLASTY WITH STENT PLACEMENT  06/11/2008  . CORONARY ANGIOPLASTY WITH STENT PLACEMENT  06/2007   TAXUS stent to RCA/notes 01/31/2011  . ESOPHAGOGASTRODUODENOSCOPY  09/28/2011   Procedure: ESOPHAGOGASTRODUODENOSCOPY (EGD);  Surgeon: Missy Sabins, MD;  Location: [facility] ENDOSCOPY;  Service: Endoscopy;  Laterality: N/A;  . ESOPHAGOGASTRODUODENOSCOPY N/A 04/07/2015   Procedure: ESOPHAGOGASTRODUODENOSCOPY (EGD);  Surgeon: Teena Irani, MD;  Location: [facility];  Service: Endoscopy;  Laterality: N/A;  . ESOPHAGOGASTRODUODENOSCOPY N/A 04/19/2015   Procedure: ESOPHAGOGASTRODUODENOSCOPY (EGD);  Surgeon: Arta Silence, MD;  Location: [facility] ENDOSCOPY;  Service: Endoscopy;  Laterality: N/A;  . ESOPHAGOGASTRODUODENOSCOPY (EGD) WITH PROPOFOL N/A 06/13/2018   Procedure: ESOPHAGOGASTRODUODENOSCOPY (EGD) WITH PROPOFOL;  Surgeon: Wonda Horner, MD;  Location: [facility] ENDOSCOPY;  Service: Endoscopy;  Laterality: N/A;  . FLEXIBLE SIGMOIDOSCOPY N/A 05/21/2017   Procedure: Conni Elliot;  Surgeon: Clarene Essex, MD;  Location: [place];  Service: Endoscopy;  Laterality: N/A;  . FLEXIBLE SIGMOIDOSCOPY Left 07/02/2017   Procedure: FLEXIBLE SIGMOIDOSCOPY;  Surgeon: Laurence Spates, MD;  Location: [place];  Service: Endoscopy;  Laterality: Left;  . FOREIGN BODY REMOVAL  09/2003   via upper endoscopy/notes 02/12/2011  . GIVENS CAPSULE STUDY  09/30/2011   Procedure: GIVENS CAPSULE STUDY;  Surgeon: Jeryl Columbia, MD;  Location: [facility] ENDOSCOPY;  Service: Endoscopy;  Laterality: N/A;  . INSERTION OF DIALYSIS CATHETER Right 2014  . INSERTION OF DIALYSIS CATHETER Left 02/11/2013   Procedure: INSERTION OF DIALYSIS CATHETER;  Surgeon: Conrad Dundee, MD;  Location: [place];  Service: Vascular;  Laterality: Left;  Ultrasound guided  . LUMBAR  LAMINECTOMY/DECOMPRESSION MICRODISCECTOMY Bilateral 07/31/2017   Procedure: LAMINECTOMY AND FORAMINOTOMY- BILATERAL LUMBAR TWO- LUMBAR THREE;  Surgeon: Earnie Larsson, MD;  Location: [place];  Service: Neurosurgery;  Laterality: Bilateral;  LAMINECTOMY AND FORAMINOTOMY- BILATERAL LUMBAR 2- LUMBAR 3  . PERIPHERAL VASCULAR BALLOON ANGIOPLASTY  04/07/2018   Procedure: PERIPHERAL VASCULAR BALLOON ANGIOPLASTY;  Surgeon: Waynetta Sandy, MD;  Location: [place] CV LAB;  Service: Cardiovascular;;  RUE AVF  . REMOVAL OF A DIALYSIS CATHETER Right 02/11/2013   Procedure: REMOVAL OF A DIALYSIS CATHETER;  Surgeon: Conrad Stuckey, MD;  Location: [place];  Service: Vascular;  Laterality: Right;  . SAVORY DILATION N/A 04/07/2015   Procedure: SAVORY DILATION;  Surgeon: Teena Irani, MD;  Location: [facility] ENDOSCOPY;  Service: Endoscopy;  Laterality: N/A;  . SHUNTOGRAM N/A 09/20/2011   Procedure: Earney Mallet;  Surgeon: Conrad Yorkville, MD;  Location: [facility] CATH LAB;  Service: Cardiovascular;  Laterality: N/A;  . SVT ABLATION N/A 11/26/2016   Procedure: SVT Ablation;  Surgeon: Will Meredith Leeds, MD;  Location: [place] CV LAB;  Service: Cardiovascular;  Laterality: N/A;  . TONSILLECTOMY    . TOTAL KNEE ARTHROPLASTY Right 08/02/2015   Procedure: TOTAL KNEE ARTHROPLASTY;  Surgeon: Renette Butters, MD;  Location: [place];  Service: Orthopedics;  Laterality: Right;  .  VENOGRAM N/A 01/26/2013   Procedure: VENOGRAM;  Surgeon: Angelia Mould, MD;  Location: [facility] CATH LAB;  Service: Cardiovascular;  Laterality: N/A;    Allergies  Allergen Reactions  . Cephalexin Swelling and Other (See Comments)    Tolerated Rocephin 10/19. Tongue swelling, but no breathing issues Tolerated Zosyn in Sept 2019 without any issues  . Statins Other (See Comments)    Weak muscles  . Ciprofloxacin Rash    Prior to Admission medications   Medication Sig Start Date End Date Taking? Authorizing Provider  acetaminophen (TYLENOL) 325 MG tablet  Take 650 mg by mouth every 6 (six) hours as needed for mild pain or headache.    Yes [provider]  Alirocumab (PRALUENT) 150 MG/ML SOPN Inject 150 mg into the skin every 14 (fourteen) days. 03/05/18  Yes Lorretta Harp, MD  allopurinol (ZYLOPRIM) 100 MG tablet TAKE ONE TABLET BY MOUTH ONCE DAILY Patient taking differently: Take 100 mg by mouth daily.  10/14/17  Yes Biagio Borg, MD  aspirin EC 81 MG tablet Take 81 mg by mouth daily.   Yes [provider]  betamethasone dipropionate (DIPROLENE) 0.05 % cream Apply 1 application topically daily as needed (leg sores).   Yes [provider]  citalopram (CELEXA) 10 MG tablet Take 1 tablet (10 mg total) by mouth at bedtime. 10/14/17  Yes Biagio Borg, MD  colchicine 0.6 MG tablet Take 1 tablet (0.6 mg total) by mouth daily. 08/08/18  Yes Biagio Borg, MD  doxercalciferol ([name]) 4 MCG/2ML injection Inject 2 mLs (4 mcg total) into the vein Every Tuesday,Thursday,and Saturday with dialysis. 07/24/18  Yes Hosie Poisson, MD  ezetimibe (ZETIA) 10 MG tablet TAKE ONE TABLET BY MOUTH ONCE DAILY Patient taking differently: Take 10 mg by mouth daily.  04/08/17  Yes Bensimhon, Shaune Pascal, MD  ferric citrate (AURYXIA) 1 GM 210 MG(Fe) tablet Take 210-420 mg by mouth See admin instructions. Take 420 mg by mouth two times a day and 210 mg with each snack   Yes [provider]  gabapentin (NEURONTIN) 300 MG capsule Take 1 capsule (300 mg total) by mouth at bedtime. Patient taking differently: Take 600 mg by mouth at bedtime.  07/05/17  Yes Domenic Polite, MD  hydroxypropyl methylcellulose / hypromellose (ISOPTO TEARS / GONIOVISC) 2.5 % ophthalmic solution Place 1 drop into both eyes 3 (three) times daily as needed for dry eyes. 05/03/17  Yes Biagio Borg, MD  methimazole (TAPAZOLE) 5 MG tablet Take 0.5 tablets (2.5 mg total) by mouth daily. 05/16/18  Yes Renato Shin, MD  multivitamin (RENA-VIT) TABS tablet Take 1 tablet by mouth  daily. Reported on 10/24/2015   Yes [provider]  pantoprazole (PROTONIX) 40 MG tablet Take 40 mg by mouth every evening.  08/31/16  Yes [provider]  polyethylene glycol (MIRALAX / GLYCOLAX) packet Take 17 g by mouth daily. Patient taking differently: Take 17 g by mouth daily as needed for mild constipation.  05/23/17  Yes Florencia Reasons, MD  SENSIPAR 90 MG tablet Take 90 mg by mouth 2 (two) times daily. 09/18/16  Yes [provider]  WELCHOL 625 MG tablet TAKE THREE TABLETS BY MOUTH TWICE DAILY WITH MEALS Patient taking differently: Take 1,875 mg by mouth 2 (two) times daily with a meal.  10/04/17  Yes Biagio Borg, MD  zolpidem (AMBIEN) 5 MG tablet Take 1 tablet (5 mg total) by mouth at bedtime as needed. for sleep Patient taking differently: Take 5 mg  by mouth at bedtime as needed for sleep.  06/20/18  Yes Biagio Borg, MD  gabapentin (NEURONTIN) 300 MG capsule TAKE 1 TO 2 CAPSULES BY MOUTH AT BEDTIME AS NEEDED FOR PAIN AND FOR SLEEP Patient not taking: No sig reported 08/18/18   Biagio Borg, MD    Social History   Socioeconomic History  . Marital status: Married    Spouse name: Not on file  . Number of children: 3  . Years of education: 48  . Highest education level: Not on file  Occupational History  . Occupation: disabled    Employer: DISABLED  . Occupation: formerly Lawyer for [employer].  Social Needs  . Financial resource strain: Not on file  . Food insecurity:    Worry: Not on file    Inability: Not on file  . Transportation needs:    Medical: Not on file    Non-medical: Not on file  Tobacco Use  . Smoking status: Former Smoker    Packs/day: 1.00    Years: 25.00    Pack years: 25.00    Types: Cigarettes    Last attempt to quit: 10/01/2005    Years since quitting: 12.8  . Smokeless tobacco: Former Systems developer    Quit date: 10/01/2005  . Tobacco comment: Quit smoking 2007 Smoked x 25 years 1/2 ppd.  Substance and Sexual Activity  . Alcohol  use: No    Alcohol/week: 0.0 standard drinks  . Drug use: No  . Sexual activity: Never  Lifestyle  . Physical activity:    Days per week: Not on file    Minutes per session: Not on file  . Stress: Not on file  Relationships  . Social connections:    Talks on phone: Not on file    Gets together: Not on file    Attends religious service: Not on file    Active member of club or organization: Not on file    Attends meetings of clubs or organizations: Not on file    Relationship status: Not on file  . Intimate partner violence:    Fear of current or ex partner: Not on file    Emotionally abused: Not on file    Physically abused: Not on file    Forced sexual activity: Not on file  Other Topics Concern  . Not on file  Social History Narrative   Lives with wife   Caffeine use: Tea sometimes   No coffee   Left handed      Family History  Problem Relation Age of Onset  . Heart disease Sister   . Thyroid nodules Sister   . Heart disease Father   . Diabetes Father   . Kidney failure Father   . Hypertension Father   . Healthy Child   . Healthy Child   . Healthy Child   . Cancer Neg Hx     ROS: Otherwise negative unless mentioned in HPI  Physical Examination  Vitals:   08/20/18 1400 08/20/18 1415  BP: (!) 67/34 (!) 74/57  Pulse: (!) 101 (!) 101  Resp: (!) 24 (!) 24  Temp:    SpO2:     Body mass index is 30.41 kg/m.  General:  sedated Gait: Not observed HENT: WNL, normocephalic Pulmonary: normal non-labored breathing on [place] Cardiac: regular Skin: without rashes Extremities: No fluctuance, erythema, or other sign of infection related to right forearm loop graft, or prior axis and left upper arm; right arm AV fistula difficult to examine as  it is currently being used for hemodialysis; upon inspection no sores or open wounds in the groins or bilateral lower extremities Musculoskeletal: no muscle wasting or atrophy  Neurologic: Sedated Psychiatric:  sedated Lymph:   Unremarkable  CBC    Component Value Date/Time   WBC 22.4 (H) 08/20/2018 0257   RBC 3.46 (L) 08/20/2018 0257   HGB 10.0 (L) 08/20/2018 0257   HGB 11.7 (L) 11/20/2016 0842   HCT 33.5 (L) 08/20/2018 0257   HCT 35.6 (L) 11/20/2016 0842   PLT 236 08/20/2018 0257   PLT 304 11/20/2016 0842   MCV 96.8 08/20/2018 0257   MCV 93 11/20/2016 0842   MCH 28.9 08/20/2018 0257   MCHC 29.9 (L) 08/20/2018 0257   RDW 18.1 (H) 08/20/2018 0257   RDW 18.2 (H) 11/20/2016 0842   LYMPHSABS 1.8 08/20/2018 0257   MONOABS 0.7 08/20/2018 0257   EOSABS 0.0 08/20/2018 0257   BASOSABS 0.0 08/20/2018 0257    BMET    Component Value Date/Time   NA 138 08/20/2018 0257   NA 143 11/20/2016 0842   K 6.2 (H) 08/20/2018 0257   CL 103 08/20/2018 0257   CO2 21 (L) 08/20/2018 0257   GLUCOSE 168 (H) 08/20/2018 0257   BUN 67 (H) 08/20/2018 0257   BUN 59 (H) 11/20/2016 0842   CREATININE 11.49 (H) 08/20/2018 0257   CALCIUM 8.5 (L) 08/20/2018 0257   GFRNONAA 4 (L) 08/20/2018 0257   GFRAA 5 (L) 08/20/2018 0257    COAGS: Lab Results  Component Value Date   INR 1.15 08/19/2018   INR 1.12 07/16/2018   INR 1.09 07/16/2018     Non-Invasive Vascular Imaging:   Right arm duplex pending   ASSESSMENT/PLAN: This is a 67 y.o. male with suspected infection of right arm AV fistula  -Dr. Donnetta Hutching was involved in the evaluation of this patient -Patient is currently on hemodialysis via right arm fistula and thus it is difficult to fully examine his arm for source of infection -No obvious signs or symptoms of infection of right forearm loop graft or from prior access in left upper arm -We will return in a time when patient is not on hemodialysis to further inspect right arm fistula for possible source of infection   Dagoberto Ligas PA-C [facility] [phone]   I have examined the patient, reviewed and agree with above.  Currently on hemodialysis via his right upper arm AV fistula.  Needles and  dressing are in place therefore impossible to assess the fistula currently.  The dialysis nurse reports no difficulty in cannulation or flow with the fistula.  Also reports no evidence of erythema or fluid collection.  The patient does have a right forearm loop graft that shows no evidence of infection or erythema or fluid collection.  Also has a left upper arm AV Gore-Tex graft which is nonfunctional and does not have any evidence of infection.  Will reevaluate tomorrow when fistula is available for examination  Curt Jews, MD 08/20/2018 3:04 PM

## 2018-08-20 NOTE — Patient Outreach (Signed)
[facility]) Care Management  08/20/2018  [name] Apr 24, 1951 [id number]   Call received from member's wife stating member was admitted to hospital yesterday.  Home visit was scheduled for this afternoon, will cancel.  Hospital liaisons notified of admission, will follow up once discharged.  [name], [place], MSN [place] [phone]

## 2018-08-20 NOTE — Consult Note (Addendum)
   [facility] CM Inpatient Consult   08/20/2018  [name] 01-22-1951 [id number]   [name] is active with [place] Management program. He is followed by [facility]. Recent referral was placed to [facility] LCSW.   Went to room to speak with [name]. However, he was just recently transferred to ICU.   Made inpatient RNCM that [name] is active with [place] Management program.  Will continue to follow for progression and disposition plans. Will update [facility] RNCM accordingly.  Marthenia Rolling, MSN-Ed, RN,BSN [facility] Liaison [phone]

## 2018-08-20 NOTE — Progress Notes (Addendum)
   08/20/18 0830  What Happened  Was fall witnessed? No  Was patient injured? No  Patient found on floor  Found by Staff-comment (K. Clement Husbands, RN)  Stated prior activity other (comment) (Needed to use bathroom)  Follow Up  MD notified Dr. Jonnie Finner  Time MD notified [phone]  Family notified  (No)  Additional tests No  Simple treatment Other (comment) (None)  Progress note created (see row info) Yes

## 2018-08-20 NOTE — Progress Notes (Addendum)
Dr. Jonnie Finner paged via [facility] system. Patient's BP 62/44, HR 112. Rapid Response in the room. IV bolus started. Patient not easily aroused. Patient restless.  1050: BP 77/39 HR 113  K. Clement Husbands, RN

## 2018-08-20 NOTE — Consult Note (Signed)
[place] for Infectious Disease    Date of Admission:  08/19/2018     Total days of antibiotics 1       Reason for Consult: Re-Current Klebsiella Bactereamia   Referring Provider: Jonnie Finner Primary Care Provider: Biagio Borg, MD   Assessment/Plan:  Mr. Lonon is a 67 year old male with ESRD on dialysis with recent history of Klebsiella pneumoniae bacteremia and treated with ceftriaxone for 5 days and 10 days of Omnicef who now returns with fevers and altered mental status found to have Klebsiella pneumoniae bacteremia. Source of the infection is unclear as his AV fistula appears without infection currently with primary team checking ultrasound. CT of the head and abdomen are unremarkable for source of infection. Continues to have fevers with leukocytosis now at 22 and clinically is obtunded likely related to his fevers and infection. He is going for dialysis today. Following visit noted rapid response was initiated for hypotension and transferred to ICU.    1. Change cefepime to meropenem in the event this is ESBL or resistant.  2. Monitor fever curve and WBC count. 3. ID will continue to follow.    Principal Problem:   Sepsis ([place]) Active Problems:   Ischemic cardiomyopathy   OSA (obstructive sleep apnea)   Hyperthyroidism   Acute encephalopathy   DM (diabetes mellitus) type II controlled with renal manifestation ([name])   PAD (peripheral artery disease) ([name])   Status post coronary artery stent placement   ESRD on hemodialysis ([name])   Chronic GI bleeding   Chronic combined systolic (congestive) and diastolic (congestive) heart failure ([place])   . allopurinol  100 mg Oral Daily  . aspirin EC  81 mg Oral Daily  . Chlorhexidine Gluconate Cloth  6 each Topical Q0600  . cinacalcet  90 mg Oral BID WC  . citalopram  10 mg Oral QHS  . [START ON 08/22/2018] colchicine  0.6 mg Oral Once per day on Mon Wed Fri  . colesevelam  1,875 mg Oral BID WC  . ezetimibe  10 mg Oral  Daily  . heparin  5,000 Units Subcutaneous Q8H  . insulin aspart  0-9 Units Subcutaneous TID WC  . methimazole  2.5 mg Oral Daily  . multivitamin  1 tablet Oral Daily  . pantoprazole  40 mg Oral QPM  . patiromer  8.4 g Oral Once  . sodium bicarbonate  50 mEq Intravenous Once     HPI: TERRY BOLOTIN is a 67 y.o. male with previous history as listed below and significant of ESRD on dialysis and recent hospital admission for Klebsiella pneumoniae bacteremia with no source able to be identified who has returned to the ED with altered mental status and fevers. Previously received 5 days of ceftriaxone followed by [facility] for 10-14 days.    Per chart review Mr. Acuna has not been feeling well over the past few days and progressively worsening. He was unable to complete dialysis due to illness and noted to be groggy opening his eyes to voice. Afebrile in the ED with tachycardia. Lab work with leukocytosis of 14.6 and lactic acid of 2.76. Remaining blood work was consistent with ESRD. Started on Cefepime with concern for sepsis. CT of the head with no acute intracranial abnormalities. Blood cultures were once again positive for Klebsiella pneumoniae. Previous bacteremia was resistant only to ampicillin.   Mr. Schaible was febrile overnight and again this morning with max temperature of 103. WBC count is trending upward now at 22.4.  Currently receiving cefepime and previously received a dose of vancomycin and cefepime.   Not currently able to provide history due to altered mental status.   Review of Systems: Review of Systems  Unable to perform ROS: Acuity of condition  Constitutional: Negative for fever.     Past Medical History:  Diagnosis Date  . Allergic rhinitis, cause unspecified 02/24/2014  . Anemia 06/16/2011  . BENIGN PROSTATIC HYPERTROPHY 10/14/2009  . CAD, NATIVE VESSEL 02/06/2009   a. 06/2007 s/p Taxus DES to the RCA;  b. 08/2016 NSTEMI in setting of SVT/PCI: LM 30ost, LAD 33m (3.0x16  Synergy DES), LCX 30m, OM1 60, RI 40, RCA 70p/m, 49m - not amenable to PCI.  [name] Cervical radiculopathy, chronic 02/23/2016   Right c5-6 by NCS/EMG  . Chronic combined systolic (congestive) and diastolic (congestive) heart failure ([place])    a. 10/2016 Echo: EF 40-45%, Gr2 DD. mildly dil LA.  [name] COLONIC POLYPS, HX OF 10/14/2009  . Dementia ([place]) [id number]  . Depression 09/24/2015  . DIABETES MELLITUS, TYPE II 02/01/2010  . DYSLIPIDEMIA 06/18/2007  . ESRD (end stage renal disease) on dialysis ([place]) 08/04/2010   ESRD due to DM adn HTN, started HD in 2011. As of 2019 has a R arm graft and gets HD on TTS sched  . FOOT PAIN 08/12/2008  . GAIT DISTURBANCE 03/03/2010  . GASTROENTERITIS, VIRAL 10/14/2009  . GERD 06/18/2007  . GOITER, MULTINODULAR 12/26/2007  . GOUT 06/18/2007  . GYNECOMASTIA 07/17/2010  . Hemodialysis access, fistula mature [facility])    Dialysis T-Th-Sa () Right upper arm fistula  . Hyperlipidemia 10/16/2011  . Hyperparathyroidism, secondary ([place]) 06/16/2011  . HYPERTENSION 06/18/2007  . Hyperthyroidism   . Hypocalcemia 06/07/2010  . Ischemic cardiomyopathy    a. 10/2016 Echo: EF 40-45%.  . Lumbar stenosis with neurogenic claudication   . ONYCHOMYCOSIS, TOENAILS 12/26/2007  . OSA on CPAP 10/16/2011  . Other malaise and fatigue 11/24/2009  . PERIPHERAL NEUROPATHY 06/18/2007  . Prostate cancer ([place])   . PSVT (paroxysmal supraventricular tachycardia) ([place])    a. 99/8338 complicated by NSTEMI;  b. 11/2016 Treated w/ adenosine in ED;  c. 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  [name] PULMONARY NODULE, RIGHT LOWER LOBE 06/08/2009  . Sleep apnea    cpap machine and o2  . TRANSAMINASES, SERUM, ELEVATED 02/01/2010  . Transfusion history    none recent  . Unspecified hypotension 01/30/2010    Social History   Tobacco Use  . Smoking status: Former Smoker    Packs/day: 1.00    Years: 25.00    Pack years: 25.00    Types: Cigarettes    Last attempt to quit: 10/01/2005    Years since quitting: 12.8   . Smokeless tobacco: Former Systems developer    Quit date: 10/01/2005  . Tobacco comment: Quit smoking 2007 Smoked x 25 years 1/2 ppd.  Substance Use Topics  . Alcohol use: No    Alcohol/week: 0.0 standard drinks  . Drug use: No    Family History  Problem Relation Age of Onset  . Heart disease Sister   . Thyroid nodules Sister   . Heart disease Father   . Diabetes Father   . Kidney failure Father   . Hypertension Father   . Healthy Child   . Healthy Child   . Healthy Child   . Cancer Neg Hx     Allergies  Allergen Reactions  . Cephalexin Swelling and Other (See Comments)    Tolerated Rocephin 10/19. Tongue swelling, but  no breathing issues Tolerated Zosyn in Sept 2019 without any issues  . Statins Other (See Comments)    Weak muscles  . Ciprofloxacin Rash    OBJECTIVE: Blood pressure (!) 84/39, pulse (!) 115, temperature (!) 103 F (39.4 C), temperature source Oral, resp. rate (!) 28, height 6' (1.829 m), weight 101.7 kg, SpO2 100 %.  Physical Exam  Constitutional: He appears well-developed. He has a sickly appearance. He appears ill.  Lying in bed; obtunded.   Cardiovascular: Normal heart sounds and intact distal pulses. Tachycardia present. Exam reveals no gallop and no friction rub.  No murmur heard. Right upper extremity fistula with bruit and thrill. No induration or drainage.   Pulmonary/Chest: Effort normal and breath sounds normal. No stridor. He has no wheezes. He has no rales. He exhibits no tenderness.  Abdominal: Soft. Bowel sounds are normal.  Skin: Skin is warm.    Lab Results Lab Results  Component Value Date   WBC 22.4 (H) 08/20/2018   HGB 10.0 (L) 08/20/2018   HCT 33.5 (L) 08/20/2018   MCV 96.8 08/20/2018   PLT 236 08/20/2018    Lab Results  Component Value Date   CREATININE 11.49 (H) 08/20/2018   BUN 67 (H) 08/20/2018   NA 138 08/20/2018   K 6.2 (H) 08/20/2018   CL 103 08/20/2018   CO2 21 (L) 08/20/2018    Lab Results  Component Value Date    ALT 46 (H) 08/20/2018   AST 39 08/20/2018   ALKPHOS 103 08/20/2018   BILITOT 0.8 08/20/2018     Microbiology: Recent Results (from the past 240 hour(s))  Culture, blood (routine x 2)     Status: None (Preliminary result)   Collection Time: 08/19/18  5:07 PM  Result Value Ref Range Status   Specimen Description BLOOD RIGHT ANTECUBITAL  Final   Special Requests   Final    BOTTLES DRAWN AEROBIC AND ANAEROBIC Blood Culture adequate volume   Culture  Setup Time   Final    GRAM NEGATIVE RODS IN BOTH AEROBIC AND ANAEROBIC BOTTLES CRITICAL RESULT CALLED TO, READ BACK BY AND VERIFIED WITHKarsten Ro [facility] [id number] 08/20/18 A BROWNING Performed at [facility], [place] [address] [place], [place] [address]    Culture PENDING  Incomplete   Report Status PENDING  Incomplete  Culture, blood (routine x 2)     Status: None (Preliminary result)   Collection Time: 08/19/18  5:23 PM  Result Value Ref Range Status   Specimen Description BLOOD RIGHT ARM  Final   Special Requests   Final    BOTTLES DRAWN AEROBIC AND ANAEROBIC Blood Culture results may not be optimal due to an inadequate volume of blood received in culture bottles   Culture  Setup Time   Final    GRAM NEGATIVE RODS IN BOTH AEROBIC AND ANAEROBIC BOTTLES Organism ID to follow CRITICAL RESULT CALLED TO, READ BACK BY AND VERIFIED WITHKarsten Ro [facility] [id number] 08/20/18 A BROWNING Performed at [facility], [place] [address] [place], [place] [address]    Culture PENDING  Incomplete   Report Status PENDING  Incomplete  Blood Culture ID Panel (Reflexed)     Status: Abnormal   Collection Time: 08/19/18  5:23 PM  Result Value Ref Range Status   Enterococcus species NOT DETECTED NOT DETECTED Final   Listeria monocytogenes NOT DETECTED NOT DETECTED Final   Staphylococcus species NOT DETECTED NOT DETECTED Final   Staphylococcus aureus (BCID) NOT DETECTED NOT DETECTED Final  Streptococcus species NOT DETECTED NOT DETECTED Final    Streptococcus agalactiae NOT DETECTED NOT DETECTED Final   Streptococcus pneumoniae NOT DETECTED NOT DETECTED Final   Streptococcus pyogenes NOT DETECTED NOT DETECTED Final   Acinetobacter baumannii NOT DETECTED NOT DETECTED Final   Enterobacteriaceae species DETECTED (A) NOT DETECTED Final    Comment: Enterobacteriaceae represent a large family of gram-negative bacteria, not a single organism. CRITICAL RESULT CALLED TO, READ BACK BY AND VERIFIED WITH: [facility] PHARMD [id number] 08/20/18 A BROWNING    Enterobacter cloacae complex NOT DETECTED NOT DETECTED Final   Escherichia coli NOT DETECTED NOT DETECTED Final   Klebsiella oxytoca NOT DETECTED NOT DETECTED Final   Klebsiella pneumoniae DETECTED (A) NOT DETECTED Final    Comment: CRITICAL RESULT CALLED TO, READ BACK BY AND VERIFIED WITH: Karsten Ro PHARMD [id number] 08/20/18 A BROWNING    Proteus species NOT DETECTED NOT DETECTED Final   Serratia marcescens NOT DETECTED NOT DETECTED Final   Carbapenem resistance NOT DETECTED NOT DETECTED Final   Haemophilus influenzae NOT DETECTED NOT DETECTED Final   Neisseria meningitidis NOT DETECTED NOT DETECTED Final   Pseudomonas aeruginosa NOT DETECTED NOT DETECTED Final   Candida albicans NOT DETECTED NOT DETECTED Final   Candida glabrata NOT DETECTED NOT DETECTED Final   Candida krusei NOT DETECTED NOT DETECTED Final   Candida parapsilosis NOT DETECTED NOT DETECTED Final   Candida tropicalis NOT DETECTED NOT DETECTED Final    Comment: Performed at [facility], [place] [address] [place], [place] [address]     Terri Piedra, [place] [facility] [phone] Pager  08/20/2018  10:31 AM

## 2018-08-20 NOTE — Progress Notes (Signed)
BP 74/38 Stared neo at 50 mcg/min

## 2018-08-20 NOTE — Progress Notes (Addendum)
Patient's HR in the 130's. Into patients room and patient was on his knees at bedside with elbows propped on bed and unable to get back into bed. Patient states he didn't fall. States, "I was trying to go pee". However, patient anuric. Patient was placed back into bed. Patient was confused and restless. V/s done. MD notified of patient's condition. MD states he will be at bedside shortly.  Emelda Fear, RN

## 2018-08-20 NOTE — Progress Notes (Signed)
Hypoglycemic Event  CBG: 68  Treatment: D50 IV 25 mL  Symptoms: Nervous/irritable  Follow-up CBG: Time:1124 CBG Result:102 Possible Reasons for Event: Unknown  Comments/MD notified:[facility] made aware    [name], Bettina Gavia

## 2018-08-20 NOTE — Progress Notes (Signed)
Report given to [place]. RN on [employer]

## 2018-08-20 NOTE — Progress Notes (Addendum)
Patient back from CT. Dr. Jonnie Finner updated on patient status, temp 103.0 oral. BP 100/44. Ok to go to dialysis.

## 2018-08-20 NOTE — Progress Notes (Signed)
Pt reported to nurse tech that yesterday before dialysis he drank some egg nog that he bought from [employer] over a month ago.  Pt did not look at expiration date on beverage.  He reported since drinking the egg nog he felt sick to his stomach and was vomiting and having frequent bowel movements.  He stated to me the reason he's in the hospital today is due to food poisoning and will be fine once it passes.

## 2018-08-20 NOTE — Significant Event (Signed)
Rapid Response Event Note RN called pt lethargic, rectal temp 103, Hypotensive   Overview: Time Called: 1025 Arrival Time: 1030 Event Type: Neurologic, Hypotension  Initial Focused Assessment: On arrival pt lying supine in bed, able to arouse with persistent noxious stimulation, unable to stay awake, did not follow commands, skin dry and hot to touch, rectal temp 103, pt received 650 mg tylenol po prior to going to CT, per RN he was conversing prior to CT upon returning pt lethargic and unable to arouse.   Interventions: ABG 7.4/32/82/19 1 Liter Bolus NS  CBG 68 D50 IV 25 ml     Event Summary: Name of Physician Notified: Dr. Jonnie Finner  at (PTA RRT )  Name of Consulting Physician Notified: Richardson Landry PA PCCM  at 1045  Outcome: Transferred (Comment)([id number])  Event End Time: [place], Sela Hua

## 2018-08-20 NOTE — Progress Notes (Signed)
[facility] Progress Note  Subjective: tried to get up and fell on floor, didn't hit head, back in bed now, responsive, chilling  Vitals:   08/20/18 0348 08/20/18 0436 08/20/18 0547 08/20/18 0816  BP:  98/61  115/67  Pulse:  84  (!) 114  Resp:  16    Temp: (!) 97.5 F (36.4 C) 98.4 F (36.9 C)  (!) 101.7 F (38.7 C)  TempSrc: Oral Oral  Oral  SpO2:  94%  97%  Weight:   101.7 kg   Height:        Inpatient medications: . allopurinol  100 mg Oral Daily  . aspirin EC  81 mg Oral Daily  . Chlorhexidine Gluconate Cloth  6 each Topical Q0600  . cinacalcet  90 mg Oral BID WC  . citalopram  10 mg Oral QHS  . colchicine  0.6 mg Oral Daily  . colesevelam  1,875 mg Oral BID WC  . ezetimibe  10 mg Oral Daily  . heparin  5,000 Units Subcutaneous Q8H  . insulin aspart  0-9 Units Subcutaneous TID WC  . methimazole  2.5 mg Oral Daily  . multivitamin  1 tablet Oral Daily  . pantoprazole  40 mg Oral QPM  . patiromer  8.4 g Oral Once   . sodium chloride    . sodium chloride    . calcium gluconate    . ceFEPime (MAXIPIME) IV     sodium chloride, sodium chloride, acetaminophen **OR** acetaminophen, alteplase, heparin, lidocaine (PF), lidocaine-prilocaine, ondansetron **OR** ondansetron (ZOFRAN) IV, pentafluoroprop-tetrafluoroeth, polyethylene glycol  Exam:  Eyes closed, smiles to voice and responds approp to questions, +rigors, Ox 2.5  Eyes: Anicteric no pallor. ENMT: No discharge from the ears eyes nose or mouth. Neck: No mass felt.  No neck rigidity. Respiratory: No rhonchi or crepitations. Cardiovascular: S1-S2 heard no murmurs appreciated. Abdomen: Soft nontender bowel sounds present. No ascites, no hsm or mass. +bs Musculoskeletal: No edema.  No joint effusion. Skin: No rash. Neurologic: Alert awake oriented to time place and person.  Moves all extremities. Psychiatric: Appears normal.  Normal affect. RUA AVF +bruit no drainage or erythema; old access LUA no erythema/  drainage  Presentation Summary: [name] is a 67 y.o. male with history of ESRD on hemodialysis on Tuesday Thursday Saturday, diabetes mellitus type 2, hyperthyroidism, anemia, CHF was admitted last month for sepsis was brought to the ER after dialysis by patient's wife after patient was found to be weak.  Patient states he was not feeling well last 2 days and this morning he had abdominal discomfort and during dialysis he had vomited.  After dialysis patient was taken home.  And on reaching home patient was unable to get out of the car because of weakness.  Patient's wife called EMS and patient was brought to the ER.  Patient also was found to be confused.  Denies any headache chest pain or shortness of breath.  Has some abdominal discomfort diffusely.  Denies diarrhea. ED Course: In the ER patient was hypotensive with blood pressure in the 14N systolic fever of 829 F.  CT head was unremarkable.  Patient was given 2 L fluid bolus.  At the time of my exam patient has become more alert awake.  Patient was started on empiric antibiotics for sepsis admitted for further management.  Blood pressure improved with fluids.      Hospital Problems/ Course:  1. Sepsis / recurrent Klebs bacteremia - not clear source, had CT abd/ RUQ [place] and  MRI thoracic/ lumbar spine last admit Oct 2019 w/o sig findings.  Was dc'd to complete 2 wks abx (5d IV + 9d po abx).  No localizing symptoms, abd benign, no diarrhea or abd pain, no cough and CXR w/o infiltrate.  CT scan abd  ordered and pending today. R arm AV access not grossly infected, will get [place]. Per pharm have narrowed IV abx to Cefepime.  Will consult ID.  2. Diabetes mellitus type 2 not on any medications.  Will keep patient on sliding scale coverage. 3. ESRD - on HD TTS. Has some HD yest, have consulted renal .  4. Sleep apnea on CPAP. 5. Hyperthyroidism on methimazole. 6. CAD denies any chest pain. 7. History of gout on colchicine. 8. Hyperlipidemia on Zetia  and WelChol. 9. Acute encephalopathy - mild, prob a little better today. Cont holding gabapentin until patient's mental status completely improved. 10. Anemia likely from ESRD follow CBC. 11. History of chronic CHF and fluid management per nephrology.    DVT prophylaxis: Heparin. Code Status: Full code. Family Communication: Have updated patient's wife. Disposition Plan: Home. Consults called: Nephrology. Admission status: Inpatient.    Kelly Splinter MD [facility] pgr [phone] 08/20/2018, 8:48 AM   Recent Labs  Lab 08/19/18 1618 08/19/18 1716 08/19/18 2124 08/20/18 0257  NA 139 136 138 138  K 5.3* 5.3* 5.5* 6.2*  CL 100 107 103 103  CO2 24  --  21* 21*  GLUCOSE 120* 117* 139* 168*  BUN 60* 62* 63* 67*  CREATININE 11.36* 12.10* 11.32* 11.49*  CALCIUM 9.4  --  8.4* 8.5*   Recent Labs  Lab 08/19/18 1618 08/20/18 0257  AST 41 39  ALT 49* 46*  ALKPHOS 128* 103  BILITOT 0.9 0.8  PROT 7.6 6.4*  ALBUMIN 3.1* 2.5*   Recent Labs  Lab 08/19/18 1708 08/19/18 1716 08/19/18 2124 08/20/18 0257  WBC 14.6*  --  17.1* 22.4*  NEUTROABS 12.8*  --   --  19.5*  HGB 12.6* 13.3 11.3* 10.0*  HCT 42.3 39.0 35.8* 33.5*  MCV 100.2*  --  96.2 96.8  PLT 227  --  216 236   Iron/TIBC/Ferritin/ %Sat    Component Value Date/Time   IRON 16 (L) 06/12/2018 0500   TIBC 168 (L) 06/12/2018 0500   FERRITIN 709 (H) 10/17/2016 1103   IRONPCTSAT 10 (L) 06/12/2018 0500

## 2018-08-20 NOTE — Progress Notes (Signed)
Pt stated that he does not want to take bowel prep for CT scan right now and will take it later in the morning.  Pt knows to inform nurse when he starts prep.

## 2018-08-20 NOTE — Progress Notes (Signed)
Pt received as rapid response settled in room - see assessment moving all extremities , pupils 3 and brisk response

## 2018-08-20 NOTE — Procedures (Signed)
Central Venous Catheter Insertion Procedure Note [name] [id number] 02-13-1951  Procedure: Insertion of Central Venous Catheter Indications: Assessment of intravascular volume, Drug and/or fluid administration, Frequent blood sampling and CVVHDF  Procedure Details Consent: Risks of procedure as well as the alternatives and risks of each were explained to the (patient/caregiver).  Consent for procedure obtained.  Time Out: Verified patient identification, verified procedure, site/side was marked, verified correct patient position, special equipment/implants available, medications/allergies/relevent history reviewed, required imaging and test results available.  Performed  Maximum sterile technique was used including antiseptics, cap, gloves, gown, hand hygiene, mask and sheet. Skin prep: Chlorhexidine; local anesthetic administered A antimicrobial bonded/coated triple lumen catheter was placed in the right femoral vein due to patient being a dialysis patient using the Seldinger technique.  Evaluation Blood flow good Complications: No apparent complications Patient did tolerate procedure well. Chest X-ray ordered to verify placement.  CXR: Attempted RIJ x 2 unsuccessful due to inability to pass wire.  [name] 08/20/2018, 10:13 PM

## 2018-08-20 NOTE — Progress Notes (Signed)
Patient's wife, Marquese Burkland notified that patient had been moved to 15M room 05. All questions answered

## 2018-08-20 NOTE — Progress Notes (Signed)
PHARMACY - PHYSICIAN COMMUNICATION CRITICAL VALUE ALERT - BLOOD CULTURE IDENTIFICATION (BCID)  [name] is an 67 y.o. male who presented to [facility] on 08/19/2018 with a chief complaint of weakness  Assessment:  Recent Klebsiella PNA, now with Klebsiella bacteremia  Name of physician (or Provider) Contacted: R. Schertz ([facility])  Current antibiotics: Vancomycin/Cefepime/Flagyl  Changes to prescribed antibiotics recommended:  Continue Cefepime  DC Vancomycin/Flagyl   Results for orders placed or performed during the hospital encounter of 08/19/18  Blood Culture ID Panel (Reflexed) (Collected: 08/19/2018  5:23 PM)  Result Value Ref Range   Enterococcus species NOT DETECTED NOT DETECTED   Listeria monocytogenes NOT DETECTED NOT DETECTED   Staphylococcus species NOT DETECTED NOT DETECTED   Staphylococcus aureus (BCID) NOT DETECTED NOT DETECTED   Streptococcus species NOT DETECTED NOT DETECTED   Streptococcus agalactiae NOT DETECTED NOT DETECTED   Streptococcus pneumoniae NOT DETECTED NOT DETECTED   Streptococcus pyogenes NOT DETECTED NOT DETECTED   Acinetobacter baumannii NOT DETECTED NOT DETECTED   Enterobacteriaceae species DETECTED (A) NOT DETECTED   Enterobacter cloacae complex NOT DETECTED NOT DETECTED   Escherichia coli NOT DETECTED NOT DETECTED   Klebsiella oxytoca NOT DETECTED NOT DETECTED   Klebsiella pneumoniae DETECTED (A) NOT DETECTED   Proteus species NOT DETECTED NOT DETECTED   Serratia marcescens NOT DETECTED NOT DETECTED   Carbapenem resistance NOT DETECTED NOT DETECTED   Haemophilus influenzae NOT DETECTED NOT DETECTED   Neisseria meningitidis NOT DETECTED NOT DETECTED   Pseudomonas aeruginosa NOT DETECTED NOT DETECTED   Candida albicans NOT DETECTED NOT DETECTED   Candida glabrata NOT DETECTED NOT DETECTED   Candida krusei NOT DETECTED NOT DETECTED   Candida parapsilosis NOT DETECTED NOT DETECTED   Candida tropicalis NOT DETECTED NOT DETECTED     [place], [place] 08/20/2018  7:29 AM

## 2018-08-21 LAB — POCT ACTIVATED CLOTTING TIME
ACTIVATED CLOTTING TIME: 169 s
ACTIVATED CLOTTING TIME: 164 s
ACTIVATED CLOTTING TIME: 169 s
ACTIVATED CLOTTING TIME: 175 s
ACTIVATED CLOTTING TIME: 175 s
ACTIVATED CLOTTING TIME: 180 s
ACTIVATED CLOTTING TIME: 219 s
ACTIVATED CLOTTING TIME: 219 s
Activated Clotting Time: 158 seconds
Activated Clotting Time: 180 seconds
Activated Clotting Time: 164 seconds
Activated Clotting Time: 175 seconds
Activated Clotting Time: 180 seconds
Activated Clotting Time: 180 seconds
Activated Clotting Time: 180 seconds
Activated Clotting Time: 180 seconds
Activated Clotting Time: 180 seconds
Activated Clotting Time: 180 seconds
Activated Clotting Time: 197 s

## 2018-08-21 LAB — RENAL FUNCTION PANEL
ALBUMIN: 2.4 g/dL — AB (ref 3.5–5.0)
ANION GAP: 12 (ref 5–15)
Albumin: 2.3 g/dL — ABNORMAL LOW (ref 3.5–5.0)
Anion gap: 14 (ref 5–15)
BUN: 47 mg/dL — ABNORMAL HIGH (ref 8–23)
BUN: 29 mg/dL — ABNORMAL HIGH (ref 8–23)
CHLORIDE: 104 mmol/L (ref 98–111)
CO2: 21 mmol/L — ABNORMAL LOW (ref 22–32)
CO2: 21 mmol/L — AB (ref 22–32)
CREATININE: 8.46 mg/dL — AB (ref 0.61–1.24)
Calcium: 7.9 mg/dL — ABNORMAL LOW (ref 8.9–10.3)
Calcium: 8.3 mg/dL — ABNORMAL LOW (ref 8.9–10.3)
Chloride: 104 mmol/L (ref 98–111)
Creatinine, Ser: 4.64 mg/dL — ABNORMAL HIGH (ref 0.61–1.24)
GFR calc Af Amer: 7 mL/min — ABNORMAL LOW (ref >60)
GFR calc non Af Amer: 12 mL/min — ABNORMAL LOW (ref >60)
GFR, EST AFRICAN AMERICAN: 14 mL/min — AB (ref >60)
GFR, EST NON AFRICAN AMERICAN: 6 mL/min — AB (ref >60)
GLUCOSE: 93 mg/dL (ref 70–99)
Glucose, Bld: 143 mg/dL — ABNORMAL HIGH (ref 70–99)
POTASSIUM: 5.3 mmol/L — AB (ref 3.5–5.1)
POTASSIUM: 4.2 mmol/L (ref 3.5–5.1)
Phosphorus: 5.3 mg/dL — ABNORMAL HIGH (ref 2.5–4.6)
Phosphorus: 2.4 mg/dL — ABNORMAL LOW (ref 2.5–4.6)
SODIUM: 137 mmol/L (ref 135–145)
Sodium: 139 mmol/L (ref 135–145)

## 2018-08-21 LAB — C DIFFICILE QUICK SCREEN W PCR REFLEX
C DIFFICILE (CDIFF) TOXIN: NEGATIVE
C Diff antigen: POSITIVE — AB

## 2018-08-21 LAB — CBC
HCT: 32.3 % — ABNORMAL LOW (ref 39.0–52.0)
Hemoglobin: 9.6 g/dL — ABNORMAL LOW (ref 13.0–17.0)
MCH: 29.1 pg (ref 26.0–34.0)
MCHC: 29.7 g/dL — ABNORMAL LOW (ref 30.0–36.0)
MCV: 97.9 fL (ref 80.0–100.0)
NRBC: 0 % (ref 0.0–0.2)
PLATELETS: 153 10*3/uL (ref 150–400)
RBC: 3.3 MIL/uL — AB (ref 4.22–5.81)
RDW: 18.2 % — ABNORMAL HIGH (ref 11.5–15.5)
WBC: 20.8 10*3/uL — ABNORMAL HIGH (ref 4.0–10.5)

## 2018-08-21 LAB — HEPATITIS B SURFACE ANTIGEN: HEP B S AG: NEGATIVE

## 2018-08-21 LAB — APTT: APTT: 43 s — AB (ref 24–36)

## 2018-08-21 LAB — GLUCOSE, CAPILLARY
GLUCOSE-CAPILLARY: 110 mg/dL — AB (ref 70–99)
GLUCOSE-CAPILLARY: 56 mg/dL — AB (ref 70–99)
Glucose-Capillary: 88 mg/dL (ref 70–99)
Glucose-Capillary: 80 mg/dL (ref 70–99)
Glucose-Capillary: 48 mg/dL — ABNORMAL LOW (ref 70–99)
Glucose-Capillary: 85 mg/dL (ref 70–99)

## 2018-08-21 LAB — CLOSTRIDIUM DIFFICILE BY PCR, REFLEXED: CDIFFPCR: POSITIVE — AB

## 2018-08-21 LAB — MAGNESIUM: Magnesium: 1.7 mg/dL (ref 1.7–2.4)

## 2018-08-21 MED ORDER — PRISMASOL BGK 4/2.5 32-4-2.5 MEQ/L IV SOLN
INTRAVENOUS | Status: DC
  Administered 2018-08-21 – 2018-08-22 (×13): via INTRAVENOUS_CENTRAL
  Filled 2018-08-21 (×17): qty 5000

## 2018-08-21 MED ORDER — PRISMASOL BGK 4/2.5 32-4-2.5 MEQ/L REPLACEMENT SOLN
Status: DC
  Administered 2018-08-21 – 2018-08-22 (×2): via INTRAVENOUS_CENTRAL
  Filled 2018-08-21 (×3): qty 5000

## 2018-08-21 MED ORDER — GABAPENTIN 100 MG PO CAPS
200.0000 mg | ORAL_CAPSULE | Freq: Every day | ORAL | Status: DC
  Administered 2018-08-21: 200 mg via ORAL
  Filled 2018-08-21: qty 2

## 2018-08-21 MED ORDER — EZETIMIBE 10 MG PO TABS
10.0000 mg | ORAL_TABLET | Freq: Every day | ORAL | Status: DC
  Administered 2018-08-22 – 2018-08-31 (×9): 10 mg via ORAL
  Filled 2018-08-21 (×10): qty 1

## 2018-08-21 MED ORDER — GABAPENTIN 250 MG/5ML PO SOLN
200.0000 mg | Freq: Every day | ORAL | Status: DC
  Administered 2018-08-21 – 2018-08-25 (×3): 200 mg via ORAL
  Filled 2018-08-21 (×6): qty 4

## 2018-08-21 MED ORDER — PRISMASOL BGK 4/2.5 32-4-2.5 MEQ/L REPLACEMENT SOLN
Status: DC
  Administered 2018-08-21 (×2): via INTRAVENOUS_CENTRAL
  Filled 2018-08-21 (×4): qty 5000

## 2018-08-21 MED ORDER — DEXTROSE 50 % IV SOLN
INTRAVENOUS | Status: AC
  Administered 2018-08-21: 25 mL
  Filled 2018-08-21: qty 50

## 2018-08-21 MED ORDER — PANTOPRAZOLE SODIUM 40 MG PO PACK
40.0000 mg | PACK | Freq: Every day | ORAL | Status: DC
Start: 2018-08-22 — End: 2018-08-23
  Administered 2018-08-22: 40 mg via ORAL
  Filled 2018-08-21: qty 20

## 2018-08-21 MED ORDER — EZETIMIBE 10 MG PO TABS
10.0000 mg | ORAL_TABLET | Freq: Every day | ORAL | Status: DC
  Administered 2018-08-21: 10 mg
  Filled 2018-08-21: qty 1

## 2018-08-21 MED ORDER — VANCOMYCIN 50 MG/ML ORAL SOLUTION
125.0000 mg | Freq: Four times a day (QID) | ORAL | Status: DC
  Administered 2018-08-21 (×2): 125 mg via ORAL
  Filled 2018-08-21 (×4): qty 2.5

## 2018-08-21 MED ORDER — PANTOPRAZOLE SODIUM 40 MG PO PACK
40.0000 mg | PACK | Freq: Every day | ORAL | Status: DC
  Administered 2018-08-21: 40 mg
  Filled 2018-08-21: qty 20

## 2018-08-21 MED ORDER — SODIUM CHLORIDE 0.9 % IV SOLN
250.0000 [IU]/h | INTRAVENOUS | Status: DC
  Administered 2018-08-21: 250 [IU]/h via INTRAVENOUS_CENTRAL
  Administered 2018-08-21: 1200 [IU]/h via INTRAVENOUS_CENTRAL
  Administered 2018-08-21 – 2018-08-22 (×2): 1350 [IU]/h via INTRAVENOUS_CENTRAL
  Filled 2018-08-21 (×6): qty 2

## 2018-08-21 MED ORDER — ASPIRIN 81 MG PO CHEW
81.0000 mg | CHEWABLE_TABLET | Freq: Every day | ORAL | Status: DC
  Administered 2018-08-22 – 2018-08-31 (×9): 81 mg via ORAL
  Filled 2018-08-21 (×9): qty 1

## 2018-08-21 MED ORDER — SODIUM CHLORIDE 0.9 % IV SOLN
1.0000 g | Freq: Two times a day (BID) | INTRAVENOUS | Status: DC
  Administered 2018-08-21 – 2018-08-22 (×3): 1 g via INTRAVENOUS
  Filled 2018-08-21 (×3): qty 1

## 2018-08-21 MED ORDER — HEPARIN BOLUS VIA INFUSION (CRRT)
1000.0000 [IU] | INTRAVENOUS | Status: DC | PRN
  Filled 2018-08-21: qty 1000

## 2018-08-21 MED ORDER — METHIMAZOLE 5 MG PO TABS
2.5000 mg | ORAL_TABLET | Freq: Every day | ORAL | Status: DC
  Administered 2018-08-21: 2.5 mg

## 2018-08-21 MED ORDER — SODIUM CHLORIDE 0.9 % IV SOLN: INTRAVENOUS | Status: DC | PRN

## 2018-08-21 MED ORDER — METHIMAZOLE 5 MG PO TABS
2.5000 mg | ORAL_TABLET | Freq: Every day | ORAL | Status: DC
  Administered 2018-08-22 – 2018-08-31 (×10): 2.5 mg via ORAL
  Filled 2018-08-21 (×10): qty 1

## 2018-08-21 MED ORDER — ASPIRIN 81 MG PO CHEW
81.0000 mg | CHEWABLE_TABLET | Freq: Every day | ORAL | Status: DC
  Administered 2018-08-21: 81 mg
  Filled 2018-08-21: qty 1

## 2018-08-21 MED ORDER — HEPARIN SODIUM (PORCINE) 1000 UNIT/ML DIALYSIS
1000.0000 [IU] | INTRAMUSCULAR | Status: DC | PRN
  Filled 2018-08-21: qty 1
  Filled 2018-08-21 (×2): qty 6

## 2018-08-21 MED ORDER — GABAPENTIN 250 MG/5ML PO SOLN: 200.0000 mg | Freq: Every day | ORAL | Status: DC

## 2018-08-21 NOTE — Progress Notes (Signed)
PHARMACY NOTE:  ANTIMICROBIAL RENAL DOSAGE ADJUSTMENT  Current antimicrobial regimen includes a mismatch between antimicrobial dosage and estimated renal function.  As per policy approved by the Pharmacy & Therapeutics and Medical Executive Committees, the antimicrobial dosage will be adjusted accordingly.  Current antimicrobial dosage:  Merrem 500mg  IV Q24H (as dosed for intermittent HD, now switching to CRRT)  Indication: Recurrent K.pneumo bacteremia  Renal Function:  Estimated Creatinine Clearance: 7.5 mL/min (A) (by C-G formula based on SCr of 11.49 mg/dL (H)). []      On intermittent HD, scheduled: [x]      On CRRT    Antimicrobial dosage has been changed to:  Merrem 1g IV Q12H    Thank you for allowing pharmacy to be a part of this patient's care.  Wynona Neat, PharmD, BCPS  08/21/2018 2:21 AM

## 2018-08-21 NOTE — Progress Notes (Signed)
  [facility] Progress Note   Assessment/ Plan:   Dialysis orders:  [facility] 4 hrs  EDW 98.5 kg 2K/ 2.25 Ca bath F180 dialyzer UF profile 2 No heparin UF Profile    Assessment/Plan: 1 Septic shock secondary to klebsiella bacteremia: recurrent bacteremia.  ? Source but given recurrent bacteremia suspect fistula, has had multiple interventions on it.  CT abd/ pelvis neg  On broad spectrum antibiotics per primary.  [place] of fistula no fluid collections.  Recent TTE without veggies  2 ESRD: TTS.  Now on CRRT, continue, increase fluid removal to 100 mL/ hr 4. Anemia of ESRD: no ESA 5. Metabolic Bone Disease: hold binders for now 6.  Nutrition: renal diet when eating 7.  Dispo: ICU  Subjective:    Started on CRRT yesterday due to hypotension and inability to tolerate HD.  This AM tolerating CRRT well without issues.  BP improved.     Objective:   BP 107/79   Pulse 84   Temp (!) 97.4 F (36.3 C) (Axillary)   Resp 18   Ht 6' (1.829 m)   Wt 101.5 kg   SpO2 100%   BMI 30.35 kg/m   Physical Exam: GEN appears ill, more comfortable than yesterday HEENT eyes closed, EOMI NECK no JVD PULM clear anteriorly, muffled bases CV tachycardic, II/VI systolic murmur ABD mild diffuse tenderness EXT 1+ LE edema and UE edema NEURO confused ACCESS RUE AVF + T/B.  Mult failed accesses  Labs: BMET Recent Labs  Lab 08/19/18 1618 08/19/18 1716 08/19/18 2124 08/20/18 0257 08/21/18 0359  NA 139 136 138 138 139  K 5.3* 5.3* 5.5* 6.2* 5.3*  CL 100 107 103 103 104  CO2 24  --  21* 21* 21*  GLUCOSE 120* 117* 139* 168* 143*  BUN 60* 62* 63* 67* 47*  CREATININE 11.36* 12.10* 11.32* 11.49* 8.46*  CALCIUM 9.4  --  8.4* 8.5* 7.9*  PHOS  --   --   --   --  5.3*   CBC Recent Labs  Lab 08/19/18 1708 08/19/18 1716 08/19/18 2124 08/20/18 0257 08/21/18 0358  WBC 14.6*  --  17.1* 22.4* 20.8*  NEUTROABS 12.8*  --   --  19.5*  --   HGB 12.6* 13.3 11.3* 10.0* 9.6*  HCT 42.3  39.0 35.8* 33.5* 32.3*  MCV 100.2*  --  96.2 96.8 97.9  PLT 227  --  216 236 153    @IMGRELPRIORS @ Medications:    . allopurinol  100 mg Oral Daily  . aspirin EC  81 mg Oral Daily  . cinacalcet  90 mg Oral BID WC  . citalopram  10 mg Oral QHS  . [START ON 08/22/2018] colchicine  0.6 mg Oral Once per day on Mon Wed Fri  . colesevelam  1,875 mg Oral BID WC  . ezetimibe  10 mg Oral Daily  . gabapentin  200 mg Oral QHS  . heparin  5,000 Units Subcutaneous Q8H  . insulin aspart  0-9 Units Subcutaneous TID WC  . mouth rinse  15 mL Mouth Rinse BID  . methimazole  2.5 mg Oral Daily  . multivitamin  1 tablet Oral Daily  . pantoprazole  40 mg Oral QPM  . patiromer  8.4 g Oral Once     Madelon Lips MD [facility] pgr [phone] 08/21/2018, 7:59 AM

## 2018-08-21 NOTE — Plan of Care (Signed)

## 2018-08-21 NOTE — Progress Notes (Signed)
[facility] Progress Note  Subjective: transferred ICU yest and started on CRRT and pressors.  Today BP's better, RN thinks will be coming off of pressors today.  Pt responsive w/o c/o's.  Confused.   Vitals:   08/21/18 0800 08/21/18 0900 08/21/18 1000 08/21/18 1100  BP: (!) 83/68 116/77 111/76 104/73  Pulse: 76 75 76 73  Resp: 15 15 13 13   Temp:      TempSrc:      SpO2: 99% 97% 98% 96%  Weight:      Height:        Inpatient medications: . allopurinol  100 mg Oral Daily  . aspirin  81 mg Per Tube Daily  . cinacalcet  90 mg Oral BID WC  . citalopram  10 mg Oral QHS  . colesevelam  1,875 mg Oral BID WC  . ezetimibe  10 mg Per Tube Daily  . gabapentin  200 mg Per Tube QHS  . heparin  5,000 Units Subcutaneous Q8H  . insulin aspart  0-9 Units Subcutaneous TID WC  . mouth rinse  15 mL Mouth Rinse BID  . methimazole  2.5 mg Oral Daily  . multivitamin  1 tablet Oral Daily  . pantoprazole sodium  40 mg Per Tube Daily   .  prismasol BGK 4/2.5 300 mL/hr at 08/21/18 0324  .  prismasol BGK 4/2.5 200 mL/hr at 08/21/18 0324  . heparin 10,000 units/ 20 mL infusion syringe 950 Units/hr (08/21/18 1101)  . meropenem (MERREM) IV Stopped (08/21/18 3557)  . phenylephrine (NEO-SYNEPHRINE) Adult infusion 30 mcg/min (08/21/18 1100)  . prismasol BGK 4/2.5 2,000 mL/hr at 08/21/18 1059  . sodium chloride     acetaminophen **OR** acetaminophen, heparin, heparin, ondansetron **OR** ondansetron (ZOFRAN) IV, polyethylene glycol, sodium chloride  Exam:  Eyes closed, smiles to voice and responds approp to questions, Ox 2.5  Eyes: Anicteric no pallor. ENMT: No discharge from the ears eyes nose or mouth. Neck: No mass felt.  No neck rigidity. Respiratory: No rhonchi or crepitations. Cardiovascular: S1-S2 heard no murmurs appreciated. Abdomen: Soft nontender bowel sounds present. No ascites, no hsm or mass. +bs Musculoskeletal: No edema.  No joint effusion. Skin: No rash. Neurologic: Alert  awake oriented to time place and person.  Moves all extremities. Psychiatric: Appears normal.  Normal affect. RUA AVF +bruit no drainage or erythema; old access LUA no erythema/ drainage  Presentation Summary: YOUSOF [name] is a 67 y.o. male with history of ESRD on hemodialysis on Tuesday Thursday Saturday, diabetes mellitus type 2, hyperthyroidism, anemia, CHF was admitted last month for sepsis was brought to the ER after dialysis by patient's wife after patient was found to be weak.  Patient states he was not feeling well last 2 days and this morning he had abdominal discomfort and during dialysis he had vomited.  After dialysis patient was taken home.  And on reaching home patient was unable to get out of the car because of weakness.  Patient's wife called EMS and patient was brought to the ER.  Patient also was found to be confused.  Denies any headache chest pain or shortness of breath.  Has some abdominal discomfort diffusely.  Denies diarrhea. ED Course: In the ER patient was hypotensive with blood pressure in the 32K systolic fever of 025 F.  CT head was unremarkable.  Patient was given 2 L fluid bolus.  At the time of my exam patient has become more alert awake.  Patient was started on empiric antibiotics for sepsis admitted  for further management.  Blood pressure improved with fluids.      Hospital Problems/ Course: 1. Sepsis / recurrent Klebs bacteremia - here on recent admit Oct 2019 also w/ klebs bacteremia, mult imaging studies but no source found. Now admitted w/ high fevers and recurrent klebs bacteremia. Seen by ID, repeat BCx today, will need TEE. Abx chg'd to meropenem.  Seen by [facility], no signs of infection on AVF and [place] neg for fluid collection.  No plan for access removal.  2. DM2 - not on any medications.  Will keep patient on sliding scale coverage. 3. ESRD - on HD TTS. Getting CRRT here now, some vol excess on CXR. Taking -100 cc/hr per renal.  4. Sleep apnea on  CPAP 5. Hyperthyroidism on methimazole. 6. CAD denies any chest pain. 7. Acute encephalopathy - still pleasantly confused.  Cont holding gabapentin until patient's mental status completely improved. 8. Anemia - likely from ESRD follow CBC 9. Resp / pulm edema - by CXR.  Per renal, getting vol off on CRRT   DVT prophylaxis: Heparin. Code Status: Full code. Family Communication: pt's son at bedside Disposition Plan: Home. Consults called: Nephrology. Admission status: Inpatient.    Kelly Splinter MD [facility] pgr [phone] 08/21/2018, 11:10 AM   Recent Labs  Lab 08/19/18 2124 08/20/18 0257 08/21/18 0359  NA 138 138 139  K 5.5* 6.2* 5.3*  CL 103 103 104  CO2 21* 21* 21*  GLUCOSE 139* 168* 143*  BUN 63* 67* 47*  CREATININE 11.32* 11.49* 8.46*  CALCIUM 8.4* 8.5* 7.9*  PHOS  --   --  5.3*   Recent Labs  Lab 08/19/18 1618 08/20/18 0257 08/21/18 0359  AST 41 39  --   ALT 49* 46*  --   ALKPHOS 128* 103  --   BILITOT 0.9 0.8  --   PROT 7.6 6.4*  --   ALBUMIN 3.1* 2.5* 2.4*   Recent Labs  Lab 08/19/18 1708  08/19/18 2124 08/20/18 0257 08/21/18 0358  WBC 14.6*  --  17.1* 22.4* 20.8*  NEUTROABS 12.8*  --   --  19.5*  --   HGB 12.6*   < > 11.3* 10.0* 9.6*  HCT 42.3   < > 35.8* 33.5* 32.3*  MCV 100.2*  --  96.2 96.8 97.9  PLT 227  --  216 236 153   < > = values in this interval not displayed.   Iron/TIBC/Ferritin/ %Sat    Component Value Date/Time   IRON 16 (L) 06/12/2018 0500   TIBC 168 (L) 06/12/2018 0500   FERRITIN 709 (H) 10/17/2016 1103   IRONPCTSAT 10 (L) 06/12/2018 0500

## 2018-08-21 NOTE — Progress Notes (Signed)
[facility]  Date of Admission:  08/19/2018     Total days of antibiotics 3         ASSESSMENT/PLAN  Mr. Genova has Klebsiella bacteremia of unclear source resulting in severe sepsis requiring vasopressors and CRRT. He is significantly improved today with pressor usage coming down. Fever curve improved today. Source of infection remains unclear as Vascular Surgery reviewed his fistula with no evidence of infection. There is also concern for C. Diff infection as he has a positive antigen and PCR with a negative toxin. Given that he has had 6 bowel movements I think it reasonable that we treat at this time. Will plan for at least 2 weeks of IV therapy likely with dialysis pending further investigation findings.   1. Continue meropenem pending sensitivities with potential narrowing of antibiotics as needed. 2. Start oral vancomycin 125 mg 4 times daily for 10 days to treat for potential C. Diff. 3. Monitor fever curve and WBC count.    Principal Problem:   Sepsis ([place]) Active Problems:   Ischemic cardiomyopathy   OSA (obstructive sleep apnea)   Hyperthyroidism   Acute encephalopathy   DM (diabetes mellitus) type II controlled with renal manifestation ([name])   PAD (peripheral artery disease) ([place])   Status post coronary artery stent placement   ESRD on hemodialysis ([place])   Encounter for hemodialysis ([place])   Chronic GI bleeding   Chronic combined systolic (congestive) and diastolic (congestive) heart failure ([name])   Altered mental status   Gram-negative bacteremia   . allopurinol  100 mg Oral Daily  . [START ON 08/22/2018] aspirin  81 mg Oral Daily  . cinacalcet  90 mg Oral BID WC  . citalopram  10 mg Oral QHS  . colesevelam  1,875 mg Oral BID WC  . [START ON 08/22/2018] ezetimibe  10 mg Oral Daily  . gabapentin  200 mg Oral QHS  . insulin aspart  0-9 Units Subcutaneous TID WC  . mouth rinse  15 mL Mouth Rinse BID  . [START ON 08/22/2018]  methimazole  2.5 mg Oral Daily  . multivitamin  1 tablet Oral Daily  . [START ON 08/22/2018] pantoprazole sodium  40 mg Oral Daily  . vancomycin  125 mg Oral QID    SUBJECTIVE:  Afebrile over night with slight improvement in WBC count now at 20.8. Now in the ICU and on vasopressors and CRRT. Nursing staff reporting 6 watery bowel movements since the beginning of shift this morning with C. Diff testing showing positive antigen and PCR and negative toxin.   Feeling better today and able to respond to questions. Unsure of the events of yesterday and believes that he was sleeping through it. No current pain. Denies fevers, chills or sweats.   Allergies  Allergen Reactions  . Cephalexin Swelling and Other (See Comments)    Tolerated Rocephin 10/19. Tongue swelling, but no breathing issues Tolerated Zosyn in Sept 2019 without any issues  . Statins Other (See Comments)    Weak muscles  . Ciprofloxacin Rash     Review of Systems: Review of Systems  Constitutional: Negative for chills, fever and malaise/fatigue.  Respiratory: Negative for cough, shortness of breath and wheezing.   Cardiovascular: Negative for chest pain and leg swelling.  Gastrointestinal: Negative for abdominal pain, constipation, diarrhea, nausea and vomiting.  Skin: Negative for rash.      OBJECTIVE: Vitals:   08/21/18 1400 08/21/18 1500 08/21/18 1600 08/21/18 1700  BP: 98/82 116/86 105/80 [name])  89/58  Pulse: 77 82 80 80  Resp: 13 (!) 22 (!) 22 15  Temp:  (!) 96.6 F (35.9 C)    TempSrc:  Axillary    SpO2: 97% 91% 92% 93%  Weight:      Height:       Body mass index is 30.35 kg/m.  Physical Exam  Constitutional: He appears well-developed and well-nourished. He appears lethargic. He has a sickly appearance. He appears ill. No distress.  Cardiovascular: Normal rate, regular rhythm, normal heart sounds and intact distal pulses. Exam reveals no gallop and no friction rub.  No murmur heard. Dialysis catheter  in right femoral is currently being used for CRRT. Dressing is clean and dry.   Pulmonary/Chest: Effort normal and breath sounds normal. No respiratory distress. He has no wheezes. He has no rales. He exhibits no tenderness.  Abdominal: Soft. Bowel sounds are normal. He exhibits no distension and no mass. There is no tenderness. There is no guarding.  Neurological: He appears lethargic.  Skin: Skin is warm and dry.  Psychiatric: He has a normal mood and affect.    Lab Results Lab Results  Component Value Date   WBC 20.8 (H) 08/21/2018   HGB 9.6 (L) 08/21/2018   HCT 32.3 (L) 08/21/2018   MCV 97.9 08/21/2018   PLT 153 08/21/2018    Lab Results  Component Value Date   CREATININE 4.64 (H) 08/21/2018   BUN 29 (H) 08/21/2018   NA 137 08/21/2018   K 4.2 08/21/2018   CL 104 08/21/2018   CO2 21 (L) 08/21/2018    Lab Results  Component Value Date   ALT 46 (H) 08/20/2018   AST 39 08/20/2018   ALKPHOS 103 08/20/2018   BILITOT 0.8 08/20/2018     Microbiology: Recent Results (from the past 240 hour(s))  Culture, blood (routine x 2)     Status: Abnormal (Preliminary result)   Collection Time: 08/19/18  5:07 PM  Result Value Ref Range Status   Specimen Description BLOOD RIGHT ANTECUBITAL  Final   Special Requests   Final    BOTTLES DRAWN AEROBIC AND ANAEROBIC Blood Culture adequate volume   Culture  Setup Time   Final    GRAM NEGATIVE RODS IN BOTH AEROBIC AND ANAEROBIC BOTTLES CRITICAL RESULT CALLED TO, READ BACK BY AND VERIFIED WITHLenna Sciara [facility] PHARMD [id number] 08/20/18 A BROWNING    Culture (A)  Final    KLEBSIELLA PNEUMONIAE SUSCEPTIBILITIES TO FOLLOW Performed at [facility], [place] [address] [place], [place] [address]    Report Status PENDING  Incomplete  Culture, blood (routine x 2)     Status: Abnormal (Preliminary result)   Collection Time: 08/19/18  5:23 PM  Result Value Ref Range Status   Specimen Description BLOOD RIGHT ARM  Final   Special Requests   Final     BOTTLES DRAWN AEROBIC AND ANAEROBIC Blood Culture results may not be optimal due to an inadequate volume of blood received in culture bottles   Culture  Setup Time   Final    GRAM NEGATIVE RODS IN BOTH AEROBIC AND ANAEROBIC BOTTLES CRITICAL RESULT CALLED TO, READ BACK BY AND VERIFIED WITHLenna Sciara [facility] PHARMD [id number] 08/20/18 A BROWNING    Culture (A)  Final    KLEBSIELLA PNEUMONIAE SUSCEPTIBILITIES TO FOLLOW Performed at [facility], [place] [address], [place], [place] [address]    Report Status PENDING  Incomplete  Blood Culture ID Panel (Reflexed)     Status: Abnormal  Collection Time: 08/19/18  5:23 PM  Result Value Ref Range Status   Enterococcus species NOT DETECTED NOT DETECTED Final   Listeria monocytogenes NOT DETECTED NOT DETECTED Final   Staphylococcus species NOT DETECTED NOT DETECTED Final   Staphylococcus aureus (BCID) NOT DETECTED NOT DETECTED Final   Streptococcus species NOT DETECTED NOT DETECTED Final   Streptococcus agalactiae NOT DETECTED NOT DETECTED Final   Streptococcus pneumoniae NOT DETECTED NOT DETECTED Final   Streptococcus pyogenes NOT DETECTED NOT DETECTED Final   Acinetobacter baumannii NOT DETECTED NOT DETECTED Final   Enterobacteriaceae species DETECTED (A) NOT DETECTED Final    Comment: Enterobacteriaceae represent a large family of gram-negative bacteria, not a single organism. CRITICAL RESULT CALLED TO, READ BACK BY AND VERIFIED WITH: [facility] PHARMD [id number] 08/20/18 A BROWNING    Enterobacter cloacae complex NOT DETECTED NOT DETECTED Final   Escherichia coli NOT DETECTED NOT DETECTED Final   Klebsiella oxytoca NOT DETECTED NOT DETECTED Final   Klebsiella pneumoniae DETECTED (A) NOT DETECTED Final    Comment: CRITICAL RESULT CALLED TO, READ BACK BY AND VERIFIED WITH: Karsten Ro PHARMD [id number] 08/20/18 A BROWNING    Proteus species NOT DETECTED NOT DETECTED Final   Serratia marcescens NOT DETECTED NOT DETECTED Final   Carbapenem resistance NOT DETECTED  NOT DETECTED Final   Haemophilus influenzae NOT DETECTED NOT DETECTED Final   Neisseria meningitidis NOT DETECTED NOT DETECTED Final   Pseudomonas aeruginosa NOT DETECTED NOT DETECTED Final   Candida albicans NOT DETECTED NOT DETECTED Final   Candida glabrata NOT DETECTED NOT DETECTED Final   Candida krusei NOT DETECTED NOT DETECTED Final   Candida parapsilosis NOT DETECTED NOT DETECTED Final   Candida tropicalis NOT DETECTED NOT DETECTED Final    Comment: Performed at [facility], [place] [address] [place], [place] [address]  MRSA PCR Screening     Status: None   Collection Time: 08/20/18 12:02 PM  Result Value Ref Range Status   MRSA by PCR NEGATIVE NEGATIVE Final    Comment:        The GeneXpert MRSA Assay (FDA approved for NASAL specimens only), is one component of a comprehensive MRSA colonization surveillance program. It is not intended to diagnose MRSA infection nor to guide or monitor treatment for MRSA infections. Performed at [facility], [place] [address] [place], [place] [address]   C difficile quick scan w PCR reflex     Status: Abnormal   Collection Time: 08/21/18 11:24 AM  Result Value Ref Range Status   C Diff antigen POSITIVE (A) NEGATIVE Final   C Diff toxin NEGATIVE NEGATIVE Final   C Diff interpretation Results are indeterminate. See PCR results.  Final    Comment: Performed at [facility], [place] [address], [place], [place] [address]  C. Diff by PCR, Reflexed     Status: Abnormal   Collection Time: 08/21/18 11:24 AM  Result Value Ref Range Status   Toxigenic C. Difficile by PCR POSITIVE (A) NEGATIVE Final    Comment: Positive for toxigenic C. difficile with little to no toxin production. Only treat if clinical presentation suggests symptomatic illness. Performed at [facility], [place] [address], [place], [place] [address]      Terri Piedra, [facility] [phone]  Pager  08/21/2018  5:16 PM

## 2018-08-21 NOTE — Care Management Note (Signed)
Case Management Note Marvetta Gibbons RN,BSN Transitions of Care Unit 47M - RN Case Manager [phone]  Patient Details  Name: [name] MRN: [id number] Date of Birth: Aug 17, 1951  Subjective/Objective:   Pt admitted with sepsis, tx to ICU on 08/20/18, CVVHD started 11/21                 Action/Plan: PTA pt lived at home with spouse, active with [facility], CM to follow for transition of care needs.   Expected Discharge Date:                  Expected Discharge Plan:     In-House Referral:     Discharge planning Services  CM Consult  Post Acute Care Choice:    Choice offered to:     DME Arranged:    DME Agency:     HH Arranged:    HH Agency:     Status of Service:  In process, will continue to follow  If discussed at Long Length of Stay Meetings, dates discussed:    Additional Comments:  Dawayne Patricia, RN 08/21/2018, 1:57 PM

## 2018-08-21 NOTE — Progress Notes (Signed)
[facility] Progress Note Patient Name: [name] DOB: 1951-01-26 MRN: [id number]   Date of Service  08/21/2018  HPI/Events of Note  Restless leg syndrome  eICU Interventions  Neurontin 150 mg po q hs        [name] 08/21/2018, 1:40 AM

## 2018-08-21 NOTE — Progress Notes (Signed)
NAME:  [name], MRN:  [id number], DOB:  10/10/1950, LOS: 2 ADMISSION DATE:  08/19/2018, CONSULTATION DATE: 08/20/2018 REFERRING MD: Triad, CHIEF COMPLAINT: Hypotension  Brief History   66 year old end-stage renal disease fevers chills abdominal pain  History of present illness   67 year old male with extensive past medical history is well-documented who was in his normal state of poor health until 08/19/2018 at which time he was undergoing dialysis in outpatient setting.  He is unable to complete secondary to abdominal pain and low blood pressure.  He was taken at home but on the way home require admission to the hospital for continued fevers abdominal pain.  Noted to have a fever of 104.  Positive for chills.  Hospitalization was positive for bacteremia. 08/20/2018 he was taken to CT scan for abdominal CT and on return he was noted to be hypertensive with systolic blood pressure 70.  Pulmonary critical care was consulted at that time.  His blood pressure low he would follow commands and move all extremities.  Right bicep AV graft was noted to be pulsatile at time of examination.  He was transferred to intensive care unit where he may require CVVH and/or intubation in the future.  Past Medical History  End-stage renal disease Ischemic cardiomyopathy Diabetes mellitus Hypertension Morbid obesity Onychogryphosis   Significant Hospital Events   08/19/2018 admission 08/20/2018 after returning from CT scan was found to be hypotensive transferred to ICU  Consults:  08/20/2018 PCCM #[id number] 2019 renal  Procedures:  08/21/2018 right femoral HD catheter>>  Significant Diagnostic Tests:  08/20/2018 CT of the abdomen>> no acute process is identified Micro Data:  08/19/2018 blood cultures x2>> 1 with Klebsiella 1 with gram-negative rods>>  Antimicrobials:  08/20/2018 meropenem 08/19/2018 vancomycin  Interim history/subjective:  67 year old male who has a recent history of  bacteremia and and now again has Klebsiella in 1-2 blood cultures. Unable to tolerate regular hemodialysis required hemodialysis catheter insertion and CVVH starting 08/21/2018 Remains hypotensive but blood pressures via cuff for inconsistent at best  Objective   Blood pressure (!) 83/68, pulse 76, temperature (!) 97.4 F (36.3 C), temperature source Axillary, resp. rate 15, height 6' (1.829 m), weight 101.5 kg, SpO2 99 %.        Intake/Output Summary (Last 24 hours) at 08/21/2018 0848 Last data filed at 08/21/2018 0800 Gross per 24 hour  Intake 3903.15 ml  Output 124 ml  Net 3779.15 ml   Filed Weights   08/20/18 0547 08/20/18 1200 08/21/18 0500  Weight: 101.7 kg 102.3 kg 101.5 kg    Examination: General: Elderly male in no acute distress more alert today HEENT: No JVD or lymphadenopathy is appreciated Neuro: Arouses to voice, opens eyes, follows commands.  I suspect he is hard of hearing CV: Heart sounds are regular rate and rhythm no overt murmur ventricular rate of 78 no monitor PULM: Decreased breath sounds in the bases GI soft nontender positive bowel sounds Extremities: warm/dry, negative edema , multiple areas of scarring on legs consistent with frequent falls Skin: no rashes or lesions   Resolved Hospital Problem list     Assessment & Plan:  Shock in the setting of abdominal pain, high fevers of 104, chills and recent history of bacteremia.  Procalcitonin noted to be elevated 28 History of hypertension holding all antihypertensive at this time secondary to shock Maintain in the intensive care unit Right femoral hemodialysis catheter placed 08/21/2018 for CVVH Neo-Synephrine to maintain systolic blood pressure greater than 90 Antibiotics  History of  ischemic cardiomyopathy note latest 2D echo May need cardiology consult in future  Bacteremia Meropenem and vancomycin day 2 Continue to follow culture data May need to repeat 2D echo in the future Remain in  intensive care unit while he is on pressor support and CVVH  questionable ID consult  End-stage renal disease unable to complete hemodialysis on Tuesday, 08/19/2018 due to abdominal pain and discomfort. 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 Pain/Anxiety/Delirium protocol (if indicated): Not indicated VAP protocol (if indicated): None indicated at this time DVT prophylaxis: Subcu heparin GI prophylaxis: PPI Glucose control: None scale insulin Mobility: Bedrest Code Status: Full code Family Communication: 08/21/2018 son updated at bedside Disposition: Intensive care unit  Labs   CBC: Recent Labs  Lab 08/19/18 1708 08/19/18 1716 08/19/18 2124 08/20/18 0257 08/21/18 0358  WBC 14.6*  --  17.1* 22.4* 20.8*  NEUTROABS 12.8*  --   --  19.5*  --   HGB 12.6* 13.3 11.3* 10.0* 9.6*  HCT 42.3 39.0 35.8* 33.5* 32.3*  MCV 100.2*  --  96.2 96.8 97.9  PLT 227  --  216 236 951    Basic Metabolic Panel: Recent Labs  Lab 08/19/18 1618 08/19/18 1716 08/19/18 2124 08/20/18 0257 08/21/18 0358 08/21/18 0359  NA 139 136 138 138  --  139  K 5.3* 5.3* 5.5* 6.2*  --  5.3*  CL 100 107 103 103  --  104  CO2 24  --  21* 21*  --  21*  GLUCOSE 120* 117* 139* 168*  --  143*  BUN 60* 62* 63* 67*  --  47*  CREATININE 11.36* 12.10* 11.32* 11.49*  --  8.46*  CALCIUM 9.4  --  8.4* 8.5*  --  7.9*  MG  --   --   --   --  1.7  --   PHOS  --   --   --   --   --  5.3*   GFR: Estimated Creatinine Clearance: 10.2 mL/min (A) (by C-G formula based on SCr of 8.46 mg/dL (H)). Recent Labs  Lab  08/19/18 1708 08/19/18 1716 08/19/18 2124 08/20/18 0017 08/20/18 0257 08/21/18 0358  PROCALCITON  --   --  20.50  --   --   --   WBC 14.6*  --  17.1*  --  22.4* 20.8*  LATICACIDVEN  --  2.76* 1.8 1.8  --   --     Liver Function Tests: Recent Labs  Lab 08/19/18 1618 08/20/18 0257 08/21/18 0359  AST 41 39  --   ALT 49* 46*  --   ALKPHOS 128* 103  --   BILITOT 0.9 0.8  --   PROT 7.6 6.4*  --   ALBUMIN 3.1* 2.5* 2.4*   No results for input(s): LIPASE, AMYLASE in the last 168 hours. No results for input(s): AMMONIA in the last 168 hours.  ABG    Component Value Date/Time   PHART 7.410 08/20/2018 1113   PCO2ART 32.7 08/20/2018 1113   PO2ART 82.3 (L) 08/20/2018 1113   HCO3 19.7 (L) 08/20/2018 1113   TCO2 28 08/19/2018 1716   ACIDBASEDEF 3.7 (H) 08/20/2018 1113  O2SAT 94.3 08/20/2018 1113     Coagulation Profile: Recent Labs  Lab 08/19/18 2124  INR 1.15    Cardiac Enzymes: Recent Labs  Lab 08/19/18 2124 08/20/18 0257  TROPONINI 0.06* 0.06*    HbA1C: Hemoglobin A1C  Date/Time Value Ref Range Status  05/16/2018 10:20 AM 6.3 (A) 4.0 - 5.6 % Final  01/06/2018 02:45 PM 6.2  Final   Hgb A1c MFr Bld  Date/Time Value Ref Range Status  07/16/2018 10:35 PM 5.7 (H) 4.8 - 5.6 % Final    Comment:    (NOTE) Pre diabetes:          5.7%-6.4% Diabetes:              >6.4% Glycemic control for   <7.0% adults with diabetes   07/02/2017 07:30 AM 4.8 4.8 - 5.6 % Final    Comment:    (NOTE) Pre diabetes:          5.7%-6.4% Diabetes:              >6.4% Glycemic control for   <7.0% adults with diabetes     CBG: Recent Labs  Lab 08/20/18 1124 08/20/18 1157 08/20/18 1508 08/20/18 2317 08/21/18 0724  GLUCAP 102* 81 74 148* 110*      Critical care time: 40 min    Richardson Landry Minor ACNP [facility] PCCM Pager [phone] till 1 pm If no answer page [phone] 08/21/2018, 8:48 AM

## 2018-08-21 NOTE — Progress Notes (Signed)
Pt placed on CPAP for the night- tolerating well at this time

## 2018-08-21 NOTE — Progress Notes (Signed)
Patient ID: [name], male   DOB: 10-Oct-1950, 68 y.o.   MRN: [id number] More alert this morning. Currently has a right femoral cath for access.  His right upper arm fistula was evaluated.  It is completely nontender.  There is no evidence of erythema and no evidence of fluctuance.  Ultrasound showed no evidence of fluid.  No physical exam evidence for involvement of infection and AV fistula.  Appears completely benign.  Would continue to look for sources.  We will not follow actively.  Please call if we can assist

## 2018-08-22 ENCOUNTER — Inpatient Hospital Stay (HOSPITAL_COMMUNITY): Payer: Medicare Other

## 2018-08-22 ENCOUNTER — Other Ambulatory Visit: Payer: Self-pay | Admitting: *Deleted

## 2018-08-22 DIAGNOSIS — A0472 Enterocolitis due to Clostridium difficile, not specified as recurrent: Secondary | ICD-10-CM

## 2018-08-22 LAB — CBC
HEMATOCRIT: 34.4 % — AB (ref 39.0–52.0)
Hemoglobin: 10.6 g/dL — ABNORMAL LOW (ref 13.0–17.0)
MCH: 29.8 pg (ref 26.0–34.0)
MCHC: 30.8 g/dL (ref 30.0–36.0)
MCV: 96.6 fL (ref 80.0–100.0)
Platelets: 116 10*3/uL — ABNORMAL LOW (ref 150–400)
RBC: 3.56 MIL/uL — ABNORMAL LOW (ref 4.22–5.81)
RDW: 17.9 % — ABNORMAL HIGH (ref 11.5–15.5)
WBC: 20.3 10*3/uL — AB (ref 4.0–10.5)
nRBC: 0 % (ref 0.0–0.2)

## 2018-08-22 LAB — GLUCOSE, CAPILLARY
GLUCOSE-CAPILLARY: 43 mg/dL — AB (ref 70–99)
GLUCOSE-CAPILLARY: 61 mg/dL — AB (ref 70–99)
GLUCOSE-CAPILLARY: 71 mg/dL (ref 70–99)
GLUCOSE-CAPILLARY: 84 mg/dL (ref 70–99)
GLUCOSE-CAPILLARY: 92 mg/dL (ref 70–99)
Glucose-Capillary: 135 mg/dL — ABNORMAL HIGH (ref 70–99)
Glucose-Capillary: 59 mg/dL — ABNORMAL LOW (ref 70–99)
Glucose-Capillary: 94 mg/dL (ref 70–99)
Glucose-Capillary: 99 mg/dL (ref 70–99)

## 2018-08-22 LAB — CULTURE, BLOOD (ROUTINE X 2): Special Requests: ADEQUATE

## 2018-08-22 LAB — BASIC METABOLIC PANEL
Anion gap: 14 (ref 5–15)
BUN: 23 mg/dL (ref 8–23)
CALCIUM: 9 mg/dL (ref 8.9–10.3)
CO2: 21 mmol/L — AB (ref 22–32)
CREATININE: 3.54 mg/dL — AB (ref 0.61–1.24)
Chloride: 102 mmol/L (ref 98–111)
GFR calc non Af Amer: 16 mL/min — ABNORMAL LOW (ref >60)
GFR, EST AFRICAN AMERICAN: 19 mL/min — AB (ref >60)
GLUCOSE: 55 mg/dL — AB (ref 70–99)
Potassium: 4 mmol/L (ref 3.5–5.1)
Sodium: 137 mmol/L (ref 135–145)

## 2018-08-22 LAB — POCT ACTIVATED CLOTTING TIME
ACTIVATED CLOTTING TIME: 208 s
ACTIVATED CLOTTING TIME: 213 s
ACTIVATED CLOTTING TIME: 208 s

## 2018-08-22 LAB — APTT: aPTT: 73 seconds — ABNORMAL HIGH (ref 24–36)

## 2018-08-22 LAB — PHOSPHORUS: Phosphorus: 2.7 mg/dL (ref 2.5–4.6)

## 2018-08-22 LAB — MAGNESIUM: Magnesium: 2.6 mg/dL — ABNORMAL HIGH (ref 1.7–2.4)

## 2018-08-22 MED ORDER — ALTEPLASE 2 MG IJ SOLR: 2.0000 mg | Freq: Once | INTRAMUSCULAR | Status: DC | PRN

## 2018-08-22 MED ORDER — HEPARIN SODIUM (PORCINE) 1000 UNIT/ML DIALYSIS
1000.0000 [IU] | INTRAMUSCULAR | Status: DC | PRN
  Administered 2018-08-22: 1000 [IU] via INTRAVENOUS_CENTRAL

## 2018-08-22 MED ORDER — DEXMEDETOMIDINE HCL IN NACL 400 MCG/100ML IV SOLN
0.4000 ug/kg/h | INTRAVENOUS | Status: DC
  Administered 2018-08-22: 0.8 ug/kg/h via INTRAVENOUS
  Administered 2018-08-22: 0.4 ug/kg/h via INTRAVENOUS
  Administered 2018-08-23 (×2): 1 ug/kg/h via INTRAVENOUS
  Administered 2018-08-23: 0.4 ug/kg/h via INTRAVENOUS
  Administered 2018-08-23 – 2018-08-24 (×4): 1.2 ug/kg/h via INTRAVENOUS
  Administered 2018-08-24: 1 ug/kg/h via INTRAVENOUS
  Administered 2018-08-24: 1.2 ug/kg/h via INTRAVENOUS
  Filled 2018-08-22 (×4): qty 100
  Filled 2018-08-22: qty 200
  Filled 2018-08-22 (×5): qty 100

## 2018-08-22 MED ORDER — SODIUM CHLORIDE 0.9 % IV SOLN
0.0000 ug/min | INTRAVENOUS | Status: DC
  Administered 2018-08-22: 25 ug/min via INTRAVENOUS
  Administered 2018-08-23: 20 ug/min via INTRAVENOUS
  Filled 2018-08-22: qty 4

## 2018-08-22 MED ORDER — LORAZEPAM 2 MG/ML IJ SOLN
0.5000 mg | INTRAMUSCULAR | Status: DC | PRN
  Administered 2018-08-22: 0.5 mg via INTRAVENOUS
  Filled 2018-08-22: qty 1

## 2018-08-22 MED ORDER — SODIUM CHLORIDE 0.9 % IV SOLN
2.0000 g | INTRAVENOUS | Status: DC
  Administered 2018-08-22 – 2018-08-29 (×8): 2 g via INTRAVENOUS
  Filled 2018-08-22 (×10): qty 20

## 2018-08-22 MED ORDER — DEXTROSE 10 % IV SOLN
INTRAVENOUS | Status: DC
  Administered 2018-08-22 – 2018-08-24 (×3): via INTRAVENOUS

## 2018-08-22 MED ORDER — HEPARIN SODIUM (PORCINE) 5000 UNIT/ML IJ SOLN
5000.0000 [IU] | Freq: Three times a day (TID) | INTRAMUSCULAR | Status: DC
  Administered 2018-08-22 – 2018-08-31 (×18): 5000 [IU] via SUBCUTANEOUS
  Filled 2018-08-22 (×19): qty 1

## 2018-08-22 MED ORDER — GLUCOSE 4 G PO CHEW
CHEWABLE_TABLET | ORAL | Status: AC
  Administered 2018-08-22: 4 g
  Filled 2018-08-22: qty 1

## 2018-08-22 MED ORDER — PENTAFLUOROPROP-TETRAFLUOROETH EX AERO: 1.0000 "application " | INHALATION_SPRAY | CUTANEOUS | Status: DC | PRN

## 2018-08-22 MED ORDER — HALOPERIDOL LACTATE 5 MG/ML IJ SOLN
5.0000 mg | Freq: Once | INTRAMUSCULAR | Status: DC
  Filled 2018-08-22: qty 1

## 2018-08-22 MED ORDER — LIDOCAINE HCL (PF) 1 % IJ SOLN: 5.0000 mL | INTRAMUSCULAR | Status: DC | PRN

## 2018-08-22 MED ORDER — DEXTROSE 50 % IV SOLN
INTRAVENOUS | Status: AC
  Administered 2018-08-22: 50 mL
  Filled 2018-08-22: qty 50

## 2018-08-22 MED ORDER — NEPRO/CARBSTEADY PO LIQD
237.0000 mL | Freq: Two times a day (BID) | ORAL | Status: DC
  Administered 2018-08-24 – 2018-08-31 (×8): 237 mL via ORAL
  Filled 2018-08-22 (×18): qty 237

## 2018-08-22 MED ORDER — SODIUM CHLORIDE 0.9 % IV SOLN: 100.0000 mL | INTRAVENOUS | Status: DC | PRN

## 2018-08-22 MED ORDER — LIDOCAINE-PRILOCAINE 2.5-2.5 % EX CREA
1.0000 "application " | TOPICAL_CREAM | CUTANEOUS | Status: DC | PRN
  Filled 2018-08-22: qty 5

## 2018-08-22 MED ORDER — VANCOMYCIN 50 MG/ML ORAL SOLUTION
500.0000 mg | Freq: Four times a day (QID) | ORAL | Status: DC
  Administered 2018-08-22: 500 mg via ORAL
  Filled 2018-08-22 (×2): qty 10

## 2018-08-22 MED ORDER — CHLORHEXIDINE GLUCONATE CLOTH 2 % EX PADS: 6.0000 | MEDICATED_PAD | Freq: Every day | CUTANEOUS | Status: DC

## 2018-08-22 MED ORDER — DIPHENHYDRAMINE HCL 12.5 MG/5ML PO ELIX
12.5000 mg | ORAL_SOLUTION | Freq: Four times a day (QID) | ORAL | Status: DC | PRN
  Administered 2018-08-22: 12.5 mg via ORAL
  Filled 2018-08-22 (×2): qty 5

## 2018-08-22 MED ORDER — VANCOMYCIN 50 MG/ML ORAL SOLUTION
125.0000 mg | Freq: Four times a day (QID) | ORAL | Status: DC
  Administered 2018-08-22 – 2018-08-31 (×26): 125 mg via ORAL
  Filled 2018-08-22 (×37): qty 2.5

## 2018-08-22 NOTE — Progress Notes (Signed)
Pt blood sugar 43 at 0544, gave 1 amp dextrose 50 and blood sugar increased to 99. Pt not eating or drinking. Notified Elink. Awaiting orders.

## 2018-08-22 NOTE — Progress Notes (Addendum)
Pharmacy Antibiotic Note  [name] is a 67 y.o. male admitted on 08/19/2018 with bacteremia and sepsis.  Pharmacy has been consulted for Ceftriaxone / Vancomycin PO dosing.   Patient is being treated for C.Diff with oral Vancomycin. Patient's C.diff PCR (+), C.diff antigen (+), C.diff toxin (-).  Patient is not hypotensive anymore. Discussed case with ID Physician Carlyle Basques who is okay with returning to Vancomycin 125mg  PO QID per the blood pressure improvement and negative toxin.   Patient received one dose of vancomycin 500mg  PO at ~0500 this morning.  Patient's cultures have returned K.pneumo (resistent to amp & amp/sul); plan is to de-escalate down to ceftriaxone.  Plan: Stop Meropenem. Stop Vancomycin 500mg  PO Start Vancomycin 125mg  PO four times a day >> start time: 1500 Start Ceftriaxone 2g IV every 24 hours >> star time: 2000.   Height: 6' (182.9 cm) Weight: 220 lb 7.4 oz (100 kg) IBW/kg (Calculated) : 77.6  Temp (24hrs), Avg:97 F (36.1 C), Min:96.3 F (35.7 C), Max:97.4 F (36.3 C)  Recent Labs  Lab 08/19/18 1708 08/19/18 1716 08/19/18 2124 08/20/18 0017 08/20/18 0257 08/21/18 0358 08/21/18 0359 08/21/18 1626 08/22/18 0443  WBC 14.6*  --  17.1*  --  22.4* 20.8*  --   --  20.3*  CREATININE  --  12.10* 11.32*  --  11.49*  --  8.46* 4.64* 3.54*  LATICACIDVEN  --  2.76* 1.8 1.8  --   --   --   --   --     Estimated Creatinine Clearance: 24.1 mL/min (A) (by C-G formula based on SCr of 3.54 mg/dL (H)).    Allergies  Allergen Reactions  . Cephalexin Swelling and Other (See Comments)    Tolerated Rocephin 10/19. Tongue swelling, but no breathing issues Tolerated Zosyn in Sept 2019 without any issues  . Statins Other (See Comments)    Weak muscles  . Ciprofloxacin Rash    Antimicrobials this admission: Vancomycin IV 11/19 >> 11/19 x1 Vancomycin PO 11/21 >>  Cefepime 11/19 >> 11/19 x1 Flagyl 11/19 >> 11/20 Mero 11/20 >> 11/22 Ceftriaxone 11/22  >>   Dose adjustments this admission: n/a  Microbiology results: 11/19 BCx: Klebsiella pneumoniae >> sensitive to all but unasyn & ampicillin 11/20 MRSA PCR: negative. 11/21 C.diff PCR: positive, C.diff antigen positive, toxin negative.    Thank you for allowing pharmacy to be a part of this patient's care.  Tamela Gammon, PharmD 08/22/2018 9:41 AM PGY-1 Pharmacy Resident Direct Phone: [phone] Please check [email or web address] for unit-specific pharmacist phone numbers

## 2018-08-22 NOTE — Progress Notes (Signed)
0739 Pt blood sugar was 59 Recurring Hypoglycemic events, pt drank about 50 cc orange juice Notified CCM NP Richardson Landry Minor & received verbal order to start D10 at 28ml/hr Will continue to encourage patient to eat and drink   Repeat CBG at 0830 was 61 2 glucose tabs given to patient   D10 fluid rate changed to 50 ml/hr

## 2018-08-22 NOTE — Progress Notes (Signed)
Pt blood sugar 48 at 2222, attempted to give patient orange juice. Pt drank very little. Repeat blood sugar 56 at 2243. Gave 45mL of dextrose 50. Follow-up blood sugar 85. Will continue to monitor.

## 2018-08-22 NOTE — Progress Notes (Signed)
Pt agitated today- no CPAP placed at this time- equipment at bedside for patient use.

## 2018-08-22 NOTE — Progress Notes (Signed)
BP 51/32 - stopped precedex - pt still kicking legs around the bed - wife at bedside

## 2018-08-22 NOTE — Progress Notes (Signed)
Pt now more calm- Placed on CPAP for the night with no issues at this time.  Pt tolerating well.

## 2018-08-22 NOTE — Progress Notes (Signed)
NAME:  [name], MRN:  [id number], DOB:  08/24/1951, LOS: 3 ADMISSION DATE:  08/19/2018, CONSULTATION DATE: 08/20/2018 REFERRING MD: [facility], CHIEF COMPLAINT: Hypotension  Brief History   67 year old end-stage renal disease fevers chills abdominal pain  History of present illness   67 year old male with extensive past medical history is well-documented who was in his normal state of poor health until 08/19/2018 at which time he was undergoing dialysis in outpatient setting.  He is unable to complete secondary to abdominal pain and low blood pressure.  He was taken at home but on the way home require admission to the hospital for continued fevers abdominal pain.  Noted to have a fever of 104.  Positive for chills.  Hospitalization was positive for bacteremia. 08/20/2018 he was taken to CT scan for abdominal CT and on return he was noted to be hypertensive with systolic blood pressure 70.  Pulmonary critical care was consulted at that time.  His blood pressure low he would follow commands and move all extremities.  Right bicep AV graft was noted to be pulsatile at time of examination.  He was transferred to intensive care unit where he may require CVVH and/or intubation in the future.  Past Medical History  End-stage renal disease Ischemic cardiomyopathy Diabetes mellitus Hypertension Morbid obesity Onychogryphosis   Significant Hospital Events   08/19/2018 admission 08/20/2018 after returning from CT scan was found to be hypotensive transferred to ICU  Consults:  08/20/2018 PCCM 08/19/2018 renal 08/21/2018 infectious disease  Procedures:  08/21/2018 right femoral HD catheter>>  Significant Diagnostic Tests:  08/20/2018 CT of the abdomen>> no acute process is identified Micro Data:  08/19/2018 blood cultures x2>> 1 with Klebsiella 1 with gram-negative rods>>  Antimicrobials:  08/20/2018 meropenem 08/21/2018 08/19/2018 vancomycin 21 2019 08/21/2018 Rocephin 08/21/2018  vancomycin  Interim history/subjective:  Currently off vasopressor support.  Or agitated and confused today.  Not taking p.o.'s or nourishment having a hypoglycemic event.  Started on D10 and low-dose Ativan along with reinstitution of his Neurontin.  Objective   Blood pressure 112/65, pulse 93, temperature (!) 97.4 F (36.3 C), temperature source Oral, resp. rate 19, height 6' (1.829 m), weight 100 kg, SpO2 95 %.        Intake/Output Summary (Last 24 hours) at 08/22/2018 6222 Last data filed at 08/22/2018 0900 Gross per 24 hour  Intake 502.85 ml  Output 2813 ml  Net -2310.15 ml   Filed Weights   08/20/18 1200 08/21/18 0500 08/22/18 0458  Weight: 102.3 kg 101.5 kg 100 kg    Examination: General: More agitated more alert planes of itching and is confused HEENT: No JVD or lymphadenopathy is appreciated Neuro: Creased confusion unable to tell exactly what date it is exactly where he is CV: Sounds are regular currently off vasopressor PULM: even/non-labored, lungs bilaterally managed in the base LN:LGXQ, non-tender, bsx4 active  Extremities: warm/dry, negative edema  Skin: Lower extremity multiple scars from reported fall   Resolved Hospital Problem list     Assessment & Plan:  Shock in the setting of abdominal pain, high fevers of 104, chills and recent history of bacteremia.  Procalcitonin noted to be elevated 28 History of hypertension holding all antihypertensive at this time secondary to shock Intensive care unit monitoring Femoral HD catheter is in place Currently off vasopressors He is being sedated due to agitation hopefully this will not interfere with his respiratory status.  History of ischemic cardiomyopathy note latest 2D echo May need cardiology consult in future  Bacteremia  Positive C. Difficile Blood culture with Klebsiella pneumoniae sensitive to imipenem Day 3 of antibiotics day 1 of Rocephin Day 0 oral vancomycin for positive C. difficile Continue  to follow culture data Noted infectious disease is now been consulted   End-stage renal disease unable to complete hemodialysis on Tuesday, 08/19/2018 due to abdominal pain and discomfort. Currently hypotensive with systolic blood pressure of 70 may need hemodialysis catheter placed for CVVH 08/22/2018 currently on CRRT Utilizing right femoral hemodialysis catheter Per renal services  Diabetes mellitus Noted to be hypoglycemic And on D10 at 20 cc an hour on 08/20/2018 for repeated CBGs less than 6  History of dementia Increasingly agitated 08/22/2018 Low-dose Ativan Resume home Neurontin for neuropathic pain and itching Hopefully does not impede his respiratory status  Best practice:  Diet: Low carbohydrate diet Pain/Anxiety/Delirium protocol (if indicated): Noted to have confusional state 08/22/2018 started on low-dose Ativan VAP protocol (if indicated): None indicated at this time DVT prophylaxis: Subcu heparin GI prophylaxis: PPI Glucose control: Sliding scale insulin noted to be hypoglycemic started on D10 08/22/2017 Mobility: Bedrest Code Status: Full code Family Communication: 10/20/2017 son updated bedside aware of father's confusion and agitation Disposition: Intensive care unit  Labs   CBC: Recent Labs  Lab 08/19/18 1708 08/19/18 1716 08/19/18 2124 08/20/18 0257 08/21/18 0358 08/22/18 0443  WBC 14.6*  --  17.1* 22.4* 20.8* 20.3*  NEUTROABS 12.8*  --   --  19.5*  --   --   HGB 12.6* 13.3 11.3* 10.0* 9.6* 10.6*  HCT 42.3 39.0 35.8* 33.5* 32.3* 34.4*  MCV 100.2*  --  96.2 96.8 97.9 96.6  PLT 227  --  216 236 153 116*    Basic Metabolic Panel: Recent Labs  Lab 08/19/18 2124 08/20/18 0257 08/21/18 0358 08/21/18 0359 08/21/18 1626 08/22/18 0443  NA 138 138  --  139 137 137  K 5.5* 6.2*  --  5.3* 4.2 4.0  CL 103 103  --  104 104 102  CO2 21* 21*  --  21* 21* 21*  GLUCOSE 139* 168*  --  143* 93 55*  BUN 63* 67*  --  47* 29* 23  CREATININE 11.32* 11.49*   --  8.46* 4.64* 3.54*  CALCIUM 8.4* 8.5*  --  7.9* 8.3* 9.0  MG  --   --  1.7  --   --  2.6*  PHOS  --   --   --  5.3* 2.4* 2.7   GFR: Estimated Creatinine Clearance: 24.1 mL/min (A) (by C-G formula based on SCr of 3.54 mg/dL (H)). Recent Labs  Lab 08/19/18 1716 08/19/18 2124 08/20/18 0017 08/20/18 0257 08/21/18 0358 08/22/18 0443  PROCALCITON  --  20.50  --   --   --   --   WBC  --  17.1*  --  22.4* 20.8* 20.3*  LATICACIDVEN 2.76* 1.8 1.8  --   --   --     Liver Function Tests: Recent Labs  Lab 08/19/18 1618 08/20/18 0257 08/21/18 0359 08/21/18 1626  AST 41 39  --   --   ALT 49* 46*  --   --   ALKPHOS 128* 103  --   --   BILITOT 0.9 0.8  --   --   PROT 7.6 6.4*  --   --   ALBUMIN 3.1* 2.5* 2.4* 2.3*   No results for input(s): LIPASE, AMYLASE in the last 168 hours. No results for input(s): AMMONIA in the last 168 hours.  ABG  Component Value Date/Time   PHART 7.410 08/20/2018 1113   PCO2ART 32.7 08/20/2018 1113   PO2ART 82.3 (L) 08/20/2018 1113   HCO3 19.7 (L) 08/20/2018 1113   TCO2 28 08/19/2018 1716   ACIDBASEDEF 3.7 (H) 08/20/2018 1113   O2SAT 94.3 08/20/2018 1113     Coagulation Profile: Recent Labs  Lab 08/19/18 2124  INR 1.15    Cardiac Enzymes: Recent Labs  Lab 08/19/18 2124 08/20/18 0257  TROPONINI 0.06* 0.06*    HbA1C: Hemoglobin A1C  Date/Time Value Ref Range Status  05/16/2018 10:20 AM 6.3 (A) 4.0 - 5.6 % Final  01/06/2018 02:45 PM 6.2  Final   Hgb A1c MFr Bld  Date/Time Value Ref Range Status  07/16/2018 10:35 PM 5.7 (H) 4.8 - 5.6 % Final    Comment:    (NOTE) Pre diabetes:          5.7%-6.4% Diabetes:              >6.4% Glycemic control for   <7.0% adults with diabetes   07/02/2017 07:30 AM 4.8 4.8 - 5.6 % Final    Comment:    (NOTE) Pre diabetes:          5.7%-6.4% Diabetes:              >6.4% Glycemic control for   <7.0% adults with diabetes     CBG: Recent Labs  Lab 08/22/18 0544 08/22/18 0612  08/22/18 0739 08/22/18 0833 08/22/18 0911  GLUCAP 43* 99 59* 61* 71      Critical care time: 40 min    Steve Gavyn Ybarra ACNP [facility] PCCM Pager [phone] till 1 pm If no answer page [phone] 08/22/2018, 9:27 AM

## 2018-08-22 NOTE — Progress Notes (Signed)
Wife talking to pt and holding cell phone up to his ear - asked wife nicely to let pt remain quiet and calm - trying to keep pt calm & in bed

## 2018-08-22 NOTE — Progress Notes (Signed)
[facility] Progress Note Patient Name: [name] DOB: [birth year] MRN: [id number]   Date of Service  08/22/2018  HPI/Events of Note  Pruritus while on dialysis  eICU Interventions  Benadryl 12.5 mg po Q 6 hrs prn itching-hold for sedation        Frederik Pear 08/22/2018, 6:09 AM

## 2018-08-22 NOTE — Progress Notes (Signed)
[facility] for Infectious Disease    Date of Admission:  08/19/2018   Total days of antibiotics 4           ID: [name] is a 67 y.o. male with severe sepsis due to klebsiella bacteremia c/b cdifficile infection Principal Problem:   Sepsis ([place]) Active Problems:   Ischemic cardiomyopathy   OSA (obstructive sleep apnea)   Hyperthyroidism   Acute encephalopathy   DM (diabetes mellitus) type II controlled with renal manifestation ([name])   PAD (peripheral artery disease) ([place])   Status post coronary artery stent placement   ESRD on hemodialysis ([place])   Encounter for hemodialysis ([place])   Chronic GI bleeding   Chronic combined systolic (congestive) and diastolic (congestive) heart failure ([name])   Altered mental status   Gram-negative bacteremia    Subjective: Afebrile, more alert, having diarrhea, had multiple watery stools, denies any abdominal cramping with his BMs.undergoing CRRT through right groin HD catheter  Medications:  . allopurinol  100 mg Oral Daily  . aspirin  81 mg Oral Daily  . [START ON 08/23/2018] Chlorhexidine Gluconate Cloth  6 each Topical Q0600  . cinacalcet  90 mg Oral BID WC  . citalopram  10 mg Oral QHS  . colesevelam  1,875 mg Oral BID WC  . ezetimibe  10 mg Oral Daily  . [START ON 08/23/2018] feeding supplement (NEPRO CARB STEADY)  237 mL Oral BID BM  . gabapentin  200 mg Oral QHS  . haloperidol lactate  5 mg Intravenous Once  . heparin injection (subcutaneous)  5,000 Units Subcutaneous Q8H  . insulin aspart  0-9 Units Subcutaneous TID WC  . mouth rinse  15 mL Mouth Rinse BID  . methimazole  2.5 mg Oral Daily  . multivitamin  1 tablet Oral Daily  . pantoprazole sodium  40 mg Oral Daily  . vancomycin  125 mg Oral Q6H    Objective: Vital signs in last 24 hours: Temp:  [97 F (36.1 C)-98 F (36.7 C)] 98 F (36.7 C) (11/22 1616) Pulse Rate:  [75-104] 83 (11/22 2000) Resp:  [11-25] 18 (11/22 2000) BP: (50-122)/(32-76) 112/67  (11/22 2000) SpO2:  [89 %-98 %] 96 % (11/22 2000) Weight:  [100 kg] 100 kg (11/22 0458) Physical Exam  Constitutional: He is oriented to person, place, and time. He appears well-developed and well-nourished. No distress.  HENT:  Mouth/Throat: Oropharynx is clear and moist. No oropharyngeal exudate.  Cardiovascular: Normal rate, regular rhythm and normal heart sounds. Exam reveals no gallop and no friction rub.  No murmur heard.  Pulmonary/Chest: Effort normal and breath sounds normal. No respiratory distress. He has no wheezes.  Abdominal: Soft. Bowel sounds are decreased. mild distension. There is no tenderness.  Neurological: He is alert and oriented to person, place, and time.  Skin: Skin is warm and dry. No rash noted. No erythema.  Psychiatric: He has a normal mood and affect. His behavior is normal.     Lab Results Recent Labs    08/21/18 0358  08/21/18 1626 08/22/18 0443  WBC 20.8*  --   --  20.3*  HGB 9.6*  --   --  10.6*  HCT 32.3*  --   --  34.4*  NA  --    < > 137 137  K  --    < > 4.2 4.0  CL  --    < > 104 102  CO2  --    < > 21* 21*  BUN  --    < > 29* 23  CREATININE  --    < > 4.64* 3.54*   < > = values in this interval not displayed.   Liver Panel Recent Labs    08/20/18 0257 08/21/18 0359 08/21/18 1626  PROT 6.4*  --   --   ALBUMIN 2.5* 2.4* 2.3*  AST 39  --   --   ALT 46*  --   --   ALKPHOS 103  --   --   BILITOT 0.8  --   --    Sedimentation Rate No results for input(s): ESRSEDRATE in the last 72 hours. C-Reactive Protein No results for input(s): CRP in the last 72 hours.  Microbiology: Susceptibility    Klebsiella pneumoniae    MIC    AMPICILLIN >=32 RESIST... Resistant    AMPICILLIN/SULBACTAM 16 INTERMED... Intermediate    CEFAZOLIN <=4 SENSITIVE  Sensitive    CEFEPIME <=1 SENSITIVE  Sensitive    CEFTAZIDIME <=1 SENSITIVE  Sensitive    CEFTRIAXONE <=1 SENSITIVE  Sensitive    CIPROFLOXACIN <=0.25 SENS... Sensitive    Extended ESBL  NEGATIVE  Sensitive    GENTAMICIN <=1 SENSITIVE  Sensitive    IMIPENEM <=0.25 SENS... Sensitive    PIP/TAZO 16 SENSITIVE  Sensitive    TRIMETH/SULFA <=20 SENSIT... Sensitive     Studies/Results: Dg Chest Port 1 View  Result Date: 08/22/2018 CLINICAL DATA:  Respiratory failure EXAM: PORTABLE CHEST 1 VIEW COMPARISON:  August 20, 2018 FINDINGS: There is cardiomegaly with pulmonary venous hypertension. There is no appreciable edema or consolidation. No adenopathy. There is aortic atherosclerosis. No bone lesions. IMPRESSION: Pulmonary vascular congestion without frank edema or consolidation. There is aortic atherosclerosis. Aortic Atherosclerosis (ICD10-I70.0). Electronically Signed   By: Lowella Grip III M.D.   On: 08/22/2018 07:43   Dg Chest Port 1 View  Result Date: 08/20/2018 CLINICAL DATA:  Attempted right internal jugular hemodialysis catheter. EXAM: PORTABLE CHEST 1 VIEW COMPARISON:  08/19/2018 FINDINGS: Shallow inspiration. Cardiac enlargement. Central vascular congestion with mild perihilar infiltration likely due to edema. Linear atelectasis or fibrosis in the mid lungs. No blunting of costophrenic angles. No pneumothorax. Mediastinal contours appear intact. Calcification of the aorta. Vascular graft in the left axilla. Postoperative changes in the cervical spine. IMPRESSION: Cardiac enlargement with mild pulmonary vascular congestion and perihilar edema. No pneumothorax. Electronically Signed   By: Lucienne Capers M.D.   On: 08/20/2018 22:30     Assessment/Plan: 67yo M with severe sepsis from recurrent klebsiella pneumoniae bacteremia, source unclear-after 48hrs of hospital admission started to have increasing diarrhea ( Cdiff PCR+/toxin negative)  Klebsiella bacteremia = will narrow to ceftriaxone 2gm iv daily. Plan on treat for 14 day with IV abtx. Source is still unknown since the patient's abd ct on admit was non revealing and he denied having diarrhea prior to admit.  Recommend getting TEE to see if signs of endocarditis-though unusual for non-ivdu.  cdifficile infection = mild, CT did not suggest having any signs of colitis. Plan on treating with oral vanco 125mg  QID  ESRD on HD = would d/c femoral access as soon as patient can tolerate using fistula.  [facility] for Infectious Diseases Cell: [phone] Pager: [phone]  08/22/2018, 8:09 PM

## 2018-08-22 NOTE — Patient Outreach (Signed)
[facility]) Care Management  08/22/2018  [name] 05-Sep-1951 [id number]   CSW was scheduled to make an initial outreach call to patient today to perform the phone assessment, as well as assess and assist with social work needs and services; however, CSW noted that patient currently resides in the hospital.  Patient presented to [facility] on Tuesday, August 19, 2018 with Altered Mental Status.  After thorough review of patient's chart, it is noted that patient's wife, Tyberius Ryner admitted to [facility] [place] that patient has not been feeling well lately.  Patient went to dialysis treatment on Tuesday, August 19, 2018, but was unable to finish treatment due to not feeling well.  Patient is now being treated for bacteremia and sepsis. CSW will continue to follow patient while hospitalized, then outreach to patient once he has been discharged back into the community to provide social work services and resources.  CSW will try and outreach to patient on Tuesday, August 26, 2018.  CSW has made [facility] Liaison with [place] Management aware of patient's recent hospitalization. Nat Christen, BSW, MSW, LCSW  Licensed Education officer, [facility]  M[name] [place], [place] [address] Physical Address-[address], [place], [place] [address] Toll Free Main # [phone] Fax # [phone] Cell # [phone]  Office # [phone] [email or web address]

## 2018-08-22 NOTE — Progress Notes (Signed)
Pt continually trying to crawl out of bed- wife and staff at bedside - pulling pt up frequently in bed. Started precedex ( see MAR) IV. Yelling at wife

## 2018-08-22 NOTE — Progress Notes (Addendum)
[facility] Progress Note   Assessment/ Plan:   Dialysis orders:  SW [facility] 4 hrs  EDW 98.5 kg 2K/ 2.25 Ca bath F180 dialyzer UF profile 2 No heparin UF Profile    Assessment/Plan: 1 Septic shock secondary to klebsiella bacteremia and C diff: recurrent bacteremia.  CT abd/ pelvis neg.  On ceftriaxone and PO vanc.  [place] of fistula no fluid collections.  Recent TTE without veggies, ID has seen, rec TEE.  Vasc surg has seen, fistula not the suspected source of infection.  Of note, he has hardware in lumbar spine and cervical spine in addition to TKA.  Would consider ESR/CRP.  2 ESRD: TTS.  CRRT 11/20-22, will stop today and convert to IHD for tomorrow.   4. Anemia of ESRD: no ESA as OP, will start for tomorrow.   5. Metabolic Bone Disease: hold binders for now 6. Nutrition: renal diet when eating 7. Hypoglycemia: on D10 gtt, hopefully will get better after eating 8.  Dispo: ICU for now  Subjective:    Off pressor, has C diff, on PO vanc, ID has seen, rec TEE.  Alert today but confused.  CRRT running well.  Wife updated by me at bedside.       Objective:   BP 106/69   Pulse 87   Temp (!) 97.4 F (36.3 C) (Oral)   Resp (!) 25   Ht 6' (1.829 m)   Wt 100 kg   SpO2 96%   BMI 29.90 kg/m   Physical Exam: GEN alert but confused HEENT eyes closed, EOMI NECK no JVD PULM clear anteriorly, muffled bases CV tachycardic, II/VI systolic murmur ABD mild diffuse tenderness EXT 1+ LE edema and UE edema NEURO nonfocal, repeats himself ACCESS RUE AVF + T/B.  Mult failed accesses  Labs: BMET Recent Labs  Lab 08/19/18 1618 08/19/18 1716 08/19/18 2124 08/20/18 0257 08/21/18 0359 08/21/18 1626 08/22/18 0443  NA 139 136 138 138 139 137 137  K 5.3* 5.3* 5.5* 6.2* 5.3* 4.2 4.0  CL 100 107 103 103 104 104 102  CO2 24  --  21* 21* 21* 21* 21*  GLUCOSE 120* 117* 139* 168* 143* 93 55*  BUN 60* 62* 63* 67* 47* 29* 23  CREATININE 11.36* 12.10* 11.32* 11.49* 8.46* 4.64*  3.54*  CALCIUM 9.4  --  8.4* 8.5* 7.9* 8.3* 9.0  PHOS  --   --   --   --  5.3* 2.4* 2.7   CBC Recent Labs  Lab 08/19/18 1708  08/19/18 2124 08/20/18 0257 08/21/18 0358 08/22/18 0443  WBC 14.6*  --  17.1* 22.4* 20.8* 20.3*  NEUTROABS 12.8*  --   --  19.5*  --   --   HGB 12.6*   < > 11.3* 10.0* 9.6* 10.6*  HCT 42.3   < > 35.8* 33.5* 32.3* 34.4*  MCV 100.2*  --  96.2 96.8 97.9 96.6  PLT 227  --  216 236 153 116*   < > = values in this interval not displayed.    @IMGRELPRIORS @ Medications:    . allopurinol  100 mg Oral Daily  . aspirin  81 mg Oral Daily  . cinacalcet  90 mg Oral BID WC  . citalopram  10 mg Oral QHS  . colesevelam  1,875 mg Oral BID WC  . ezetimibe  10 mg Oral Daily  . gabapentin  200 mg Oral QHS  . insulin aspart  0-9 Units Subcutaneous TID WC  . mouth rinse  15 mL  Mouth Rinse BID  . methimazole  2.5 mg Oral Daily  . multivitamin  1 tablet Oral Daily  . pantoprazole sodium  40 mg Oral Daily  . vancomycin  125 mg Oral Q6H     Madelon Lips MD [facility] pgr [phone] 08/22/2018, 11:27 AM

## 2018-08-23 ENCOUNTER — Inpatient Hospital Stay (HOSPITAL_COMMUNITY): Payer: Medicare Other

## 2018-08-23 DIAGNOSIS — B961 Klebsiella pneumoniae [K. pneumoniae] as the cause of diseases classified elsewhere: Secondary | ICD-10-CM

## 2018-08-23 DIAGNOSIS — Z981 Arthrodesis status: Secondary | ICD-10-CM

## 2018-08-23 DIAGNOSIS — R410 Disorientation, unspecified: Secondary | ICD-10-CM

## 2018-08-23 LAB — CBC WITH DIFFERENTIAL/PLATELET
Abs Immature Granulocytes: 0.15 10*3/uL — ABNORMAL HIGH (ref 0.00–0.07)
BASOS ABS: 0 10*3/uL (ref 0.0–0.1)
Basophils Relative: 0 %
Eosinophils Absolute: 0.3 10*3/uL (ref 0.0–0.5)
Eosinophils Relative: 2 %
HCT: 34 % — ABNORMAL LOW (ref 39.0–52.0)
HEMOGLOBIN: 10.5 g/dL — AB (ref 13.0–17.0)
IMMATURE GRANULOCYTES: 1 %
LYMPHS PCT: 12 %
Lymphs Abs: 1.9 10*3/uL (ref 0.7–4.0)
MCH: 28.9 pg (ref 26.0–34.0)
MCHC: 30.9 g/dL (ref 30.0–36.0)
MCV: 93.7 fL (ref 80.0–100.0)
Monocytes Absolute: 2.2 10*3/uL — ABNORMAL HIGH (ref 0.1–1.0)
Monocytes Relative: 14 %
NEUTROS ABS: 11.2 10*3/uL — AB (ref 1.7–7.7)
NEUTROS PCT: 71 %
NRBC: 0 % (ref 0.0–0.2)
Platelets: 133 10*3/uL — ABNORMAL LOW (ref 150–400)
RBC: 3.63 MIL/uL — AB (ref 4.22–5.81)
RDW: 17.3 % — AB (ref 11.5–15.5)
WBC: 15.8 10*3/uL — ABNORMAL HIGH (ref 4.0–10.5)

## 2018-08-23 LAB — BASIC METABOLIC PANEL
ANION GAP: 10 (ref 5–15)
BUN: 30 mg/dL — ABNORMAL HIGH (ref 8–23)
CALCIUM: 8.9 mg/dL (ref 8.9–10.3)
CO2: 21 mmol/L — ABNORMAL LOW (ref 22–32)
Chloride: 100 mmol/L (ref 98–111)
Creatinine, Ser: 4.45 mg/dL — ABNORMAL HIGH (ref 0.61–1.24)
GFR calc non Af Amer: 12 mL/min — ABNORMAL LOW (ref >60)
GFR, EST AFRICAN AMERICAN: 14 mL/min — AB (ref >60)
Glucose, Bld: 185 mg/dL — ABNORMAL HIGH (ref 70–99)
Potassium: 4.5 mmol/L (ref 3.5–5.1)
SODIUM: 131 mmol/L — AB (ref 135–145)

## 2018-08-23 LAB — GLUCOSE, CAPILLARY
GLUCOSE-CAPILLARY: 186 mg/dL — AB (ref 70–99)
Glucose-Capillary: 193 mg/dL — ABNORMAL HIGH (ref 70–99)
Glucose-Capillary: 223 mg/dL — ABNORMAL HIGH (ref 70–99)
Glucose-Capillary: 139 mg/dL — ABNORMAL HIGH (ref 70–99)

## 2018-08-23 LAB — PHOSPHORUS: PHOSPHORUS: 3.9 mg/dL (ref 2.5–4.6)

## 2018-08-23 LAB — MAGNESIUM: MAGNESIUM: 2.4 mg/dL (ref 1.7–2.4)

## 2018-08-23 MED ORDER — MIDODRINE HCL 5 MG PO TABS
10.0000 mg | ORAL_TABLET | Freq: Three times a day (TID) | ORAL | Status: DC
  Administered 2018-08-24 – 2018-08-26 (×4): 10 mg via ORAL
  Filled 2018-08-23 (×5): qty 2

## 2018-08-23 MED ORDER — FAMOTIDINE 20 MG PO TABS
20.0000 mg | ORAL_TABLET | Freq: Every day | ORAL | Status: DC
  Administered 2018-08-24 – 2018-08-31 (×8): 20 mg via ORAL
  Filled 2018-08-23 (×8): qty 1

## 2018-08-23 NOTE — Progress Notes (Signed)
    [facility] has been requested to perform a transesophageal echocardiogram on Mr.  [name] for bacteremia.  After careful review of history and examination, the risks and benefits of transesophageal echocardiogram have been explained including risks of esophageal damage, perforation (1:10,000 risk), bleeding, pharyngeal hematoma as well as other potential complications associated with conscious sedation including aspiration, arrhythmia, respiratory failure and death. Alternatives to treatment were discussed, questions were answered. Patient is sedated. Dicussed risk and benefits with wife and son at bedside who willing to proceed and will sign consent.  TEE - Dr. Meda Coffee 08/25/18 @ 1500. NPO after midnight. Meds with sips.   Leanor Kail, PA-C 08/23/2018 9:53 AM

## 2018-08-23 NOTE — Progress Notes (Signed)
[facility] Progress Note   Assessment/ Plan:   Dialysis orders:  [facility] 4 hrs  EDW 98.5 kg 2K/ 2.25 Ca bath F180 dialyzer UF profile 2 No heparin UF Profile    Assessment/Plan: 1 Septic shock secondary to klebsiella bacteremia and C diff: recurrent bacteremia.  CT abd/ pelvis neg.  On ceftriaxone and PO vanc.  [place] of fistula no fluid collections.  Recent TTE without veggies, ID has seen, rec TEE.  Vasc surg has seen, fistula not the suspected source of infection.  Of note, he has hardware in lumbar spine and cervical spine in addition to TKA. 2 ESRD: TTS.  CRRT 11/20-22, plan HD today, use AVF then pulm temp cath when able to use AVF   3. Anemia of ESRD: no ESA as OP, will start today 4. Metabolic Bone Disease: hold binders for now 5. Nutrition: renal diet when eating  Kelly Splinter MD [facility] pgr [phone]   08/23/2018, 12:34 PM      Subjective:    On pressors.  Alert but confused.  Wife updated at bedside. HD today in ICU using AVF   Objective:   BP (!) 120/38   Pulse 68   Temp 97.7 F (36.5 C) (Oral)   Resp 17   Ht 6' (1.829 m)   Wt 99.6 kg   SpO2 98%   BMI 29.78 kg/m   Physical Exam: GEN alert but confused HEENT eyes closed, EOMI NECK no JVD PULM clear anteriorly, muffled bases CV tachycardic, II/VI systolic murmur ABD mild diffuse tenderness EXT 1+ LE edema and UE edema NEURO nonfocal, repeats himself ACCESS RUE AVF + T/B.  Mult failed accesses  Labs: BMET Recent Labs  Lab 08/19/18 1618 08/19/18 1716 08/19/18 2124 08/20/18 0257 08/21/18 0359 08/21/18 1626 08/22/18 0443 08/23/18 0334  NA 139 136 138 138 139 137 137 131*  K 5.3* 5.3* 5.5* 6.2* 5.3* 4.2 4.0 4.5  CL 100 107 103 103 104 104 102 100  CO2 24  --  21* 21* 21* 21* 21* 21*  GLUCOSE 120* 117* 139* 168* 143* 93 55* 185*  BUN 60* 62* 63* 67* 47* 29* 23 30*  CREATININE 11.36* 12.10* 11.32* 11.49* 8.46* 4.64* 3.54* 4.45*  CALCIUM 9.4  --  8.4*  8.5* 7.9* 8.3* 9.0 8.9  PHOS  --   --   --   --  5.3* 2.4* 2.7 3.9   CBC Recent Labs  Lab 08/19/18 1708  08/20/18 0257 08/21/18 0358 08/22/18 0443 08/23/18 0334  WBC 14.6*   < > 22.4* 20.8* 20.3* 15.8*  NEUTROABS 12.8*  --  19.5*  --   --  11.2*  HGB 12.6*   < > 10.0* 9.6* 10.6* 10.5*  HCT 42.3   < > 33.5* 32.3* 34.4* 34.0*  MCV 100.2*   < > 96.8 97.9 96.6 93.7  PLT 227   < > 236 153 116* 133*   < > = values in this interval not displayed.    @IMGRELPRIORS @ Medications:    . allopurinol  100 mg Oral Daily  . aspirin  81 mg Oral Daily  . Chlorhexidine Gluconate Cloth  6 each Topical Q0600  . cinacalcet  90 mg Oral BID WC  . citalopram  10 mg Oral QHS  . colesevelam  1,875 mg Oral BID WC  . ezetimibe  10 mg Oral Daily  . famotidine  20 mg Oral Daily  . feeding supplement (NEPRO CARB STEADY)  237 mL Oral BID BM  .  gabapentin  200 mg Oral QHS  . haloperidol lactate  5 mg Intravenous Once  . heparin injection (subcutaneous)  5,000 Units Subcutaneous Q8H  . insulin aspart  0-9 Units Subcutaneous TID WC  . mouth rinse  15 mL Mouth Rinse BID  . methimazole  2.5 mg Oral Daily  . midodrine  10 mg Oral TID WC  . multivitamin  1 tablet Oral Daily  . vancomycin  125 mg Oral Q6H

## 2018-08-23 NOTE — Progress Notes (Signed)
This morning, patient verbally aggressive and verbally abusive towards myself and additional hospital staff. Patient physically trying to get out of bed.  Precedex dose increased to 1.9mcg/kg/hr  - patient unable to safely swallow pills at this Precedex dosage in his current arousal state. Ramaswamy MD and Pharmacist updated on patient status. MD advised to hold PO meds as necessary and retry oral route at a later time. Will continually adjust Precedex as needed, monitor swallowing abilities, and will re-attempt to administer medications PO when appropriate.   Loree Fee, RN

## 2018-08-23 NOTE — Progress Notes (Addendum)
INFECTIOUS DISEASE PROGRESS NOTE  ID: [name] is a 67 y.o. male with  Principal Problem:   Sepsis ([place]) Active Problems:   Ischemic cardiomyopathy   OSA (obstructive sleep apnea)   Hyperthyroidism   Acute encephalopathy   DM (diabetes mellitus) type II controlled with renal manifestation ([name])   PAD (peripheral artery disease) ([place])   Status post coronary artery stent placement   ESRD on hemodialysis ([place])   Encounter for hemodialysis ([place])   Chronic GI bleeding   Chronic combined systolic (congestive) and diastolic (congestive) heart failure ([name])   Altered mental status   Gram-negative bacteremia  Subjective: Confused.   Abtx:  Anti-infectives (From admission, onward)   Start     Dose/Rate Route Frequency Ordered Stop   08/22/18 2000  cefTRIAXone (ROCEPHIN) 2 g in sodium chloride 0.9 % 100 mL IVPB     2 g 200 mL/hr over 30 Minutes Intravenous Every 24 hours 08/22/18 0924     08/22/18 1500  vancomycin (VANCOCIN) 50 mg/mL oral solution 125 mg     125 mg Oral Every 6 hours 08/22/18 0934     08/22/18 1000  vancomycin (VANCOCIN) 50 mg/mL oral solution 500 mg  Status:  Discontinued     500 mg Oral 4 times daily 08/22/18 0839 08/22/18 0932   08/21/18 1800  vancomycin (VANCOCIN) 50 mg/mL oral solution 125 mg  Status:  Discontinued     125 mg Oral 4 times daily 08/21/18 1652 08/22/18 0839   08/21/18 1200  vancomycin (VANCOCIN) IVPB 1000 mg/200 mL premix  Status:  Discontinued     1,000 mg 200 mL/hr over 60 Minutes Intravenous Every T-Th-Sa (Hemodialysis) 08/19/18 1737 08/20/18 0728   08/21/18 0800  meropenem (MERREM) 1 g in sodium chloride 0.9 % 100 mL IVPB  Status:  Discontinued     1 g 200 mL/hr over 30 Minutes Intravenous Every 12 hours 08/21/18 0224 08/22/18 0923   08/20/18 1800  ceFEPIme (MAXIPIME) 1 g in sodium chloride 0.9 % 100 mL IVPB  Status:  Discontinued     1 g 200 mL/hr over 30 Minutes Intravenous Every 24 hours 08/19/18 1737 08/20/18 1012   08/20/18  1700  cefTRIAXone (ROCEPHIN) 2 g in sodium chloride 0.9 % 100 mL IVPB  Status:  Discontinued     2 g 200 mL/hr over 30 Minutes Intravenous Every 24 hours 08/20/18 1012 08/20/18 1117   08/20/18 1200  meropenem (MERREM) 500 mg in sodium chloride 0.9 % 100 mL IVPB  Status:  Discontinued     500 mg 200 mL/hr over 30 Minutes Intravenous Every 24 hours 08/20/18 1117 08/21/18 0218   08/19/18 1800  metroNIDAZOLE (FLAGYL) IVPB 500 mg  Status:  Discontinued     500 mg 100 mL/hr over 60 Minutes Intravenous Every 8 hours 08/19/18 1709 08/20/18 0728   08/19/18 1730  vancomycin (VANCOCIN) 2,000 mg in sodium chloride 0.9 % 500 mL IVPB     2,000 mg 250 mL/hr over 120 Minutes Intravenous  Once 08/19/18 1709 08/19/18 2045   08/19/18 1715  ceFEPIme (MAXIPIME) 2 g in sodium chloride 0.9 % 100 mL IVPB     2 g 200 mL/hr over 30 Minutes Intravenous  Once 08/19/18 1709 08/19/18 1802      Medications:  Scheduled: . allopurinol  100 mg Oral Daily  . aspirin  81 mg Oral Daily  . Chlorhexidine Gluconate Cloth  6 each Topical Q0600  . cinacalcet  90 mg Oral BID WC  . citalopram  10 mg Oral QHS  . colesevelam  1,875 mg Oral BID WC  . ezetimibe  10 mg Oral Daily  . famotidine  20 mg Oral Daily  . feeding supplement (NEPRO CARB STEADY)  237 mL Oral BID BM  . gabapentin  200 mg Oral QHS  . haloperidol lactate  5 mg Intravenous Once  . heparin injection (subcutaneous)  5,000 Units Subcutaneous Q8H  . insulin aspart  0-9 Units Subcutaneous TID WC  . mouth rinse  15 mL Mouth Rinse BID  . methimazole  2.5 mg Oral Daily  . midodrine  10 mg Oral TID WC  . multivitamin  1 tablet Oral Daily  . vancomycin  125 mg Oral Q6H    Objective: Vital signs in last 24 hours: Temp:  [97.2 F (36.2 C)-98 F (36.7 C)] 97.7 F (36.5 C) (11/23 0400) Pulse Rate:  [61-104] 66 (11/23 1100) Resp:  [11-23] 17 (11/23 1100) BP: (50-133)/(32-106) 96/65 (11/23 1100) SpO2:  [89 %-99 %] 97 % (11/23 1100) Weight:  [99.6 kg] 99.6 kg  (11/23 0500)   General appearance: delirious Resp: clear to auscultation bilaterally Cardio: regular rate and rhythm GI: abnormal findings:  distended and hypoactive bowel sounds Extremities: LUE fistula  Lab Results Recent Labs    08/22/18 0443 08/23/18 0334  WBC 20.3* 15.8*  HGB 10.6* 10.5*  HCT 34.4* 34.0*  NA 137 131*  K 4.0 4.5  CL 102 100  CO2 21* 21*  BUN 23 30*  CREATININE 3.54* 4.45*   Liver Panel Recent Labs    08/21/18 0359 08/21/18 1626  ALBUMIN 2.4* 2.3*   Sedimentation Rate No results for input(s): ESRSEDRATE in the last 72 hours. C-Reactive Protein No results for input(s): CRP in the last 72 hours.  Microbiology: Recent Results (from the past 240 hour(s))  Culture, blood (routine x 2)     Status: Abnormal   Collection Time: 08/19/18  5:07 PM  Result Value Ref Range Status   Specimen Description BLOOD RIGHT ANTECUBITAL  Final   Special Requests   Final    BOTTLES DRAWN AEROBIC AND ANAEROBIC Blood Culture adequate volume   Culture  Setup Time   Final    GRAM NEGATIVE RODS IN BOTH AEROBIC AND ANAEROBIC BOTTLES CRITICAL RESULT CALLED TO, READ BACK BY AND VERIFIED WITHLenna [facility] PHARMD [id number] 08/20/18 A BROWNING    Culture (A)  Final    KLEBSIELLA PNEUMONIAE SUSCEPTIBILITIES PERFORMED ON PREVIOUS CULTURE WITHIN THE LAST 5 DAYS. Performed at [facility], [place] [address] [place], [place] [address]    Report Status 08/22/2018 FINAL  Final  Culture, blood (routine x 2)     Status: Abnormal   Collection Time: 08/19/18  5:23 PM  Result Value Ref Range Status   Specimen Description BLOOD RIGHT ARM  Final   Special Requests   Final    BOTTLES DRAWN AEROBIC AND ANAEROBIC Blood Culture results may not be optimal due to an inadequate volume of blood received in culture bottles   Culture  Setup Time   Final    GRAM NEGATIVE RODS IN BOTH AEROBIC AND ANAEROBIC BOTTLES CRITICAL RESULT CALLED TO, READ BACK BY AND VERIFIED WITHKarsten Ro [facility] [id number]  08/20/18 A BROWNING Performed at [facility], [place] [address], [place], [place] [address]    Culture KLEBSIELLA PNEUMONIAE (A)  Final   Report Status 08/22/2018 FINAL  Final   Organism ID, Bacteria KLEBSIELLA PNEUMONIAE  Final      Susceptibility  Klebsiella pneumoniae - MIC*    AMPICILLIN >=32 RESISTANT Resistant     CEFAZOLIN <=4 SENSITIVE Sensitive     CEFEPIME <=1 SENSITIVE Sensitive     CEFTAZIDIME <=1 SENSITIVE Sensitive     CEFTRIAXONE <=1 SENSITIVE Sensitive     CIPROFLOXACIN <=0.25 SENSITIVE Sensitive     GENTAMICIN <=1 SENSITIVE Sensitive     IMIPENEM <=0.25 SENSITIVE Sensitive     TRIMETH/SULFA <=20 SENSITIVE Sensitive     AMPICILLIN/SULBACTAM 16 INTERMEDIATE Intermediate     PIP/TAZO 16 SENSITIVE Sensitive     Extended ESBL NEGATIVE Sensitive     * KLEBSIELLA PNEUMONIAE  Blood Culture ID Panel (Reflexed)     Status: Abnormal   Collection Time: 08/19/18  5:23 PM  Result Value Ref Range Status   Enterococcus species NOT DETECTED NOT DETECTED Final   Listeria monocytogenes NOT DETECTED NOT DETECTED Final   Staphylococcus species NOT DETECTED NOT DETECTED Final   Staphylococcus aureus (BCID) NOT DETECTED NOT DETECTED Final   Streptococcus species NOT DETECTED NOT DETECTED Final   Streptococcus agalactiae NOT DETECTED NOT DETECTED Final   Streptococcus pneumoniae NOT DETECTED NOT DETECTED Final   Streptococcus pyogenes NOT DETECTED NOT DETECTED Final   Acinetobacter baumannii NOT DETECTED NOT DETECTED Final   Enterobacteriaceae species DETECTED (A) NOT DETECTED Final    Comment: Enterobacteriaceae represent a large family of gram-negative bacteria, not a single organism. CRITICAL RESULT CALLED TO, READ BACK BY AND VERIFIED WITH: J [facility] PHARMD [id number] 08/20/18 A BROWNING    Enterobacter cloacae complex NOT DETECTED NOT DETECTED Final   Escherichia coli NOT DETECTED NOT DETECTED Final   Klebsiella oxytoca NOT DETECTED NOT DETECTED Final   Klebsiella pneumoniae  DETECTED (A) NOT DETECTED Final    Comment: CRITICAL RESULT CALLED TO, READ BACK BY AND VERIFIED WITH: Karsten Ro PHARMD [id number] 08/20/18 A BROWNING    Proteus species NOT DETECTED NOT DETECTED Final   Serratia marcescens NOT DETECTED NOT DETECTED Final   Carbapenem resistance NOT DETECTED NOT DETECTED Final   Haemophilus influenzae NOT DETECTED NOT DETECTED Final   Neisseria meningitidis NOT DETECTED NOT DETECTED Final   Pseudomonas aeruginosa NOT DETECTED NOT DETECTED Final   Candida albicans NOT DETECTED NOT DETECTED Final   Candida glabrata NOT DETECTED NOT DETECTED Final   Candida krusei NOT DETECTED NOT DETECTED Final   Candida parapsilosis NOT DETECTED NOT DETECTED Final   Candida tropicalis NOT DETECTED NOT DETECTED Final    Comment: Performed at [facility], [place] [address] [place], [place] [address]  MRSA PCR Screening     Status: None   Collection Time: 08/20/18 12:02 PM  Result Value Ref Range Status   MRSA by PCR NEGATIVE NEGATIVE Final    Comment:        The GeneXpert MRSA Assay (FDA approved for NASAL specimens only), is one component of a comprehensive MRSA colonization surveillance program. It is not intended to diagnose MRSA infection nor to guide or monitor treatment for MRSA infections. Performed at [facility], [place] [address], [place], [place] [address]   C difficile quick scan w PCR reflex     Status: Abnormal   Collection Time: 08/21/18 11:24 AM  Result Value Ref Range Status   C Diff antigen POSITIVE (A) NEGATIVE Final   C Diff toxin NEGATIVE NEGATIVE Final   C Diff interpretation Results are indeterminate. See PCR results.  Final    Comment: Performed at [facility], [place] [address], [place], [place] [address]  C. Diff by PCR, Reflexed     Status: Abnormal   Collection Time: 08/21/18 11:24 AM  Result Value Ref Range Status   Toxigenic C. Difficile by PCR POSITIVE (A) NEGATIVE Final    Comment: Positive for toxigenic C. difficile with  little to no toxin production. Only treat if clinical presentation suggests symptomatic illness. Performed at [facility], [place] [address], [place], [name] [address]     Studies/Results: Dg Chest Port 1 View  Result Date: 08/23/2018 CLINICAL DATA:  Respiratory failure EXAM: PORTABLE CHEST 1 VIEW COMPARISON:  08/22/2018 FINDINGS: Cardiomegaly, pulmonary vascular congestion and mild bibasilar atelectasis again noted. No pneumothorax. Little significant change from the prior study. IMPRESSION: Unchanged appearance of the chest with pulmonary vascular congestion and mild bibasilar atelectasis. Electronically Signed   By: Margarette Canada M.D.   On: 08/23/2018 08:36   Dg Chest Port 1 View  Result Date: 08/22/2018 CLINICAL DATA:  Respiratory failure EXAM: PORTABLE CHEST 1 VIEW COMPARISON:  August 20, 2018 FINDINGS: There is cardiomegaly with pulmonary venous hypertension. There is no appreciable edema or consolidation. No adenopathy. There is aortic atherosclerosis. No bone lesions. IMPRESSION: Pulmonary vascular congestion without frank edema or consolidation. There is aortic atherosclerosis. Aortic Atherosclerosis (ICD10-I70.0). Electronically Signed   By: Lowella Grip III M.D.   On: 08/22/2018 07:43     Assessment/Plan: Klebsiella bacteremia C diff ESRD Delrium Lumbar hardware (L2-3, L5-S1)  Total days of antibiotics: 4 merrem        1/10 po vanco  No BM since 2am.        Temps better, WBC improved.  CT colonic diverticula, no source of infection.  Could change him to ceftriaxone as his sensi indicate, however need clarification of his allergy status.  Having some behavioral issues. Sedated.  Fistula u/s clean, vasc has eval.  Repeat MRI of spine?   Bobby Rumpf MD, FACP Infectious Diseases (pager) [phone] [email or web address] 08/23/2018, 11:29 AM  LOS: 4 days

## 2018-08-23 NOTE — Progress Notes (Signed)
NAME:  [name], MRN:  [id number], DOB:  November 05, 1950, LOS: 4 ADMISSION DATE:  08/19/2018, CONSULTATION DATE: 08/20/2018 REFERRING MD: [facility], CHIEF COMPLAINT: Hypotension  Brief History    67 year old male with extensive past medical history is well-documented who was in his normal state of poor health until 08/19/2018 at which time he was undergoing dialysis in outpatient setting.  He is unable to complete secondary to abdominal pain and low blood pressure.  He was taken at home but on the way home require admission to the hospital for continued fevers abdominal pain.  Noted to have a fever of 104.  Positive for chills.  Hospitalization was positive for bacteremia. 08/20/2018 he was taken to CT scan for abdominal CT and on return he was noted to be hypertensive with systolic blood pressure 70.  Pulmonary critical care was consulted at that time.  His blood pressure low he would follow commands and move all extremities.  Right bicep AV graft was noted to be pulsatile at time of examination.  He was transferred to intensive care unit where he may require CVVH and/or intubation in the future.  Past Medical History  End-stage renal disease Ischemic cardiomyopathy Diabetes mellitus Hypertension Morbid obesity Onychogryphosis   Significant Hospital Events   08/19/2018 admission 08/20/2018 after returning from CT scan was found to be hypotensive transferred to ICU 08/22/18 - Currently off vasopressor support.   agitated and confused today.  Not taking p.o.'s or nourishment having a hypoglycemic event.  Started on D10 and low-dose Ativan along with reinstitution of his Neurontin.   Consults:  08/20/2018 PCCM 08/19/2018 renal 08/21/2018 infectious disease  Procedures:  08/21/2018 right femoral HD catheter>>  Significant Diagnostic Tests:  08/20/2018 CT of the abdomen>> no acute process is identified Micro Data:  08/19/2018 blood cultures x2>> 1 with Klebsiella 1 with gram-negative  rods>> 08/21/18  C Diff - Ag +ve, PCR +. Toxin - negative . No colitis on CT   Antimicrobials:  08/20/2018 meropenem 08/21/2018 08/19/2018 vancomycin 21 2019 08/21/2018 Rocephin IV (ID recommends 2 week totalRx) 08/21/2018 vancomycin ORAL  Interim history/subjective:   08/23/18 - needed prcedex since yesterday fro agitation and secondary neosnyephrine for low bp. Overnight precedex weaned -> improved delirum (partial capacity +) but also became verbally abusive to staff using F Word multiple times and demanding to stand and transfer to chair without giving RNs any time to console him. Advised him he was being verbally abusive to RNs. Wife did not appreciate my directives.   Current infusions - D10 (hypoglyucemia, decreased po) , precedex and neo . Sugars now 190s even at time of agitation  Afebrile since 08/21/18. WBC improved. Significant  Diarrhea yesterday -> stopped at 2am today  Renal plans for HD v CRRT today  ID recommending TEE and dc femoral line asap  Objective   Blood pressure 109/62, pulse 65, temperature 97.7 F (36.5 C), temperature source Oral, resp. rate 14, height 6' (1.829 m), weight 99.6 kg, SpO2 98 %.        Intake/Output Summary (Last 24 hours) at 08/23/2018 0852 Last data filed at 08/23/2018 0700 Gross per 24 hour  Intake 1662.43 ml  Output 582 ml  Net 1080.43 ml   Filed Weights   08/21/18 0500 08/22/18 0458 08/23/18 0500  Weight: 101.5 kg 100 kg 99.6 kg    General Appearance:  Obese + . Calmer on higher precedex Head:  Normocephalic, without obvious abnormality, atraumatic Eyes:  PERRL - yes, conjunctiva/corneas - clear  Ears:  Normal external ear canals, both ears Nose:  G tube - no Throat:  ETT TUBE - no , OG tube - no Neck:  Supple,  No enlargement/tenderness/nodules Lungs: Clear to auscultation bilaterally, V Heart:  S1 and S2 normal, no murmur, CVP - na.  Pressors - yes Abdomen:  Soft, no masses, no organomegaly Genitalia / Rectal:  Not  done Extremities:  Extremities- intact Skin:  ntact in exposed areas . Sacral area - x Neurologic:  Sedation - precedex gtt -> RASS - 0 . Moves all 4s - yes. CAM-ICU - mildly positive for delirium . Orientation - x 2 +     LABS    PULMONARY Recent Labs  Lab 08/19/18 1716 08/20/18 1113  PHART  --  7.410  PCO2ART  --  32.7  PO2ART  --  82.3*  HCO3  --  19.7*  TCO2 28  --   O2SAT  --  94.3    CBC Recent Labs  Lab 08/21/18 0358 08/22/18 0443 08/23/18 0334  HGB 9.6* 10.6* 10.5*  HCT 32.3* 34.4* 34.0*  WBC 20.8* 20.3* 15.8*  PLT 153 116* 133*    COAGULATION Recent Labs  Lab 08/19/18 2124  INR 1.15    CARDIAC   Recent Labs  Lab 08/19/18 2124 08/20/18 0257  TROPONINI 0.06* 0.06*   No results for input(s): PROBNP in the last 168 hours.   CHEMISTRY Recent Labs  Lab 08/20/18 0257 08/21/18 0358 08/21/18 0359 08/21/18 1626 08/22/18 0443 08/23/18 0334  NA 138  --  139 137 137 131*  K 6.2*  --  5.3* 4.2 4.0 4.5  CL 103  --  104 104 102 100  CO2 21*  --  21* 21* 21* 21*  GLUCOSE 168*  --  143* 93 55* 185*  BUN 67*  --  47* 29* 23 30*  CREATININE 11.49*  --  8.46* 4.64* 3.54* 4.45*  CALCIUM 8.5*  --  7.9* 8.3* 9.0 8.9  MG  --  1.7  --   --  2.6* 2.4  PHOS  --   --  5.3* 2.4* 2.7 3.9   Estimated Creatinine Clearance: 19.1 mL/min (A) (by C-G formula based on SCr of 4.45 mg/dL (H)).   LIVER Recent Labs  Lab 08/19/18 1618 08/19/18 2124 08/20/18 0257 08/21/18 0359 08/21/18 1626  AST 41  --  39  --   --   ALT 49*  --  46*  --   --   ALKPHOS 128*  --  103  --   --   BILITOT 0.9  --  0.8  --   --   PROT 7.6  --  6.4*  --   --   ALBUMIN 3.1*  --  2.5* 2.4* 2.3*  INR  --  1.15  --   --   --      INFECTIOUS Recent Labs  Lab 08/19/18 1716 08/19/18 2124 08/20/18 0017  LATICACIDVEN 2.76* 1.8 1.8  PROCALCITON  --  20.50  --      ENDOCRINE CBG (last 3)  Recent Labs    08/22/18 2335 08/23/18 0432 08/23/18 0846  GLUCAP 135* 186* 193*          IMAGING x48h  - image(s) personally visualized  -   highlighted in bold Dg Chest Port 1 View  Result Date: 08/23/2018 CLINICAL DATA:  Respiratory failure EXAM: PORTABLE CHEST 1 VIEW COMPARISON:  08/22/2018 FINDINGS: Cardiomegaly, pulmonary vascular congestion and mild bibasilar atelectasis again noted. No pneumothorax.  Little significant change from the prior study. IMPRESSION: Unchanged appearance of the chest with pulmonary vascular congestion and mild bibasilar atelectasis. Electronically Signed   By: Margarette Canada M.D.   On: 08/23/2018 08:36   Dg Chest Port 1 View  Result Date: 08/22/2018 CLINICAL DATA:  Respiratory failure EXAM: PORTABLE CHEST 1 VIEW COMPARISON:  August 20, 2018 FINDINGS: There is cardiomegaly with pulmonary venous hypertension. There is no appreciable edema or consolidation. No adenopathy. There is aortic atherosclerosis. No bone lesions. IMPRESSION: Pulmonary vascular congestion without frank edema or consolidation. There is aortic atherosclerosis. Aortic Atherosclerosis (ICD10-I70.0). Electronically Signed   By: Lowella Grip III M.D.   On: 08/22/2018 07:43      Resolved Hospital Problem list     Assessment & Plan:  #History of hypertension holding all antihypertensive at this time secondary to shock #Septic shock (f abdominal pain, high fevers of 104, chills and Klebsiella +/- C Diff;  Procalcitonin noted to be elevated 28)   - off pressors 08/22/18 AM but back on neo 08/22/18 pm due to precerdex  PLAN - MAP goal > 60 and sbp > 100 .  History of ischemic cardiomyopathy note latest 2D echo. ID concerned for endocarditis plan Cards consult called   Bacteremia - Blood culture with Klebsiella pneumoniae sensitive to imipenem Positive C. Difficile PLAN Per ID consult  Change protonix to H2 blockade   ESRD - per renal (might need CRRT 08/23/18 due to neo need)   Diabetes mellitus Noted to be hypoglycemic 08/21/18./ On 08/23/18 -  sugars 190s on D10 Plan Titrate d10 appropritely  Hx of hyperthyroid - on methimazole at home (TSH 0.7 11/18 Plan  - continue methimazole  Hx of Gout Plan  - hold colchicine - continue allopurinol  History of dementia - on home celexa, ambien, neurontin,   08/23/18 - restarted neurontin yesterday due to complaints of hand pain and wife keen on it  PLAN  - hold ambien  - continue neurontin - continue celexa - precedex gtt  -dc ativan prn   Best practice:  Diet: Low carbohydrate diet Pain/Anxiety/Delirium protocol (if indicated): precedex gtt VAP protocol (if indicated): None indicated at this time DVT prophylaxis: Subcu heparin GI prophylaxis: PPI change to H2 blockade due to C diff Glucose control: Sliding scale insulin noted to be hypoglycemic started on D10 08/22/2017 - back off 08/23/09 Mobility: Bedrest Code Status: Full code Family Communication: 10/20/2017 son updated bedside aware of [name]'s confusion and agitation. On 08/23/18 - son [name] and wife communicated with   Disposition: Intensive care unit   ATTESTATION & SIGNATURE   The patient [name] is critically ill with multiple organ systems failure and requires high complexity decision making for assessment and support, frequent evaluation and titration of therapies, application of advanced monitoring technologies and extensive interpretation of multiple databases.   Critical Care Time devoted to patient care services described in this note is  30  Minutes. This time reflects time of care of this signee Dr Brand Males. This critical care time does not reflect procedure time, or teaching time or supervisory time of PA/NP/Med student/Med Resident etc but could involve care discussion time     Dr. Brand Males, M.D., [facility].C.P Pulmonary and Critical Care Medicine Staff Physician [place] [facility] Pager: [phone], If no answer or between  15:00h -  7:00h: call [phone]  08/23/2018 8:53 AM

## 2018-08-24 LAB — CBC WITH DIFFERENTIAL/PLATELET
Abs Immature Granulocytes: 0.15 K/uL — ABNORMAL HIGH (ref 0.00–0.07)
Basophils Absolute: 0 K/uL (ref 0.0–0.1)
Basophils Relative: 0 %
Eosinophils Absolute: 0.3 K/uL (ref 0.0–0.5)
Eosinophils Relative: 3 %
HCT: 36.2 % — ABNORMAL LOW (ref 39.0–52.0)
Hemoglobin: 10.9 g/dL — ABNORMAL LOW (ref 13.0–17.0)
Immature Granulocytes: 2 %
Lymphocytes Relative: 15 %
Lymphs Abs: 1.4 K/uL (ref 0.7–4.0)
MCH: 28.8 pg (ref 26.0–34.0)
MCHC: 30.1 g/dL (ref 30.0–36.0)
MCV: 95.5 fL (ref 80.0–100.0)
Monocytes Absolute: 1.4 K/uL — ABNORMAL HIGH (ref 0.1–1.0)
Monocytes Relative: 15 %
Neutro Abs: 6 K/uL (ref 1.7–7.7)
Neutrophils Relative %: 65 %
Platelets: 133 K/uL — ABNORMAL LOW (ref 150–400)
RBC: 3.79 MIL/uL — ABNORMAL LOW (ref 4.22–5.81)
RDW: 17.6 % — ABNORMAL HIGH (ref 11.5–15.5)
WBC: 9.2 K/uL (ref 4.0–10.5)
nRBC: 0 % (ref 0.0–0.2)

## 2018-08-24 LAB — RENAL FUNCTION PANEL
Albumin: 2 g/dL — ABNORMAL LOW (ref 3.5–5.0)
Anion gap: 10 (ref 5–15)
BUN: 48 mg/dL — ABNORMAL HIGH (ref 8–23)
CO2: 20 mmol/L — ABNORMAL LOW (ref 22–32)
Calcium: 8.9 mg/dL (ref 8.9–10.3)
Chloride: 99 mmol/L (ref 98–111)
Creatinine, Ser: 5.65 mg/dL — ABNORMAL HIGH (ref 0.61–1.24)
GFR calc Af Amer: 11 mL/min — ABNORMAL LOW
GFR calc non Af Amer: 9 mL/min — ABNORMAL LOW
Glucose, Bld: 226 mg/dL — ABNORMAL HIGH (ref 70–99)
Phosphorus: 5.7 mg/dL — ABNORMAL HIGH (ref 2.5–4.6)
Potassium: 4.7 mmol/L (ref 3.5–5.1)
Sodium: 129 mmol/L — ABNORMAL LOW (ref 135–145)

## 2018-08-24 LAB — GLUCOSE, CAPILLARY
GLUCOSE-CAPILLARY: 164 mg/dL — AB (ref 70–99)
Glucose-Capillary: 221 mg/dL — ABNORMAL HIGH (ref 70–99)
Glucose-Capillary: 179 mg/dL — ABNORMAL HIGH (ref 70–99)
Glucose-Capillary: 88 mg/dL (ref 70–99)

## 2018-08-24 LAB — MAGNESIUM: Magnesium: 2.5 mg/dL — ABNORMAL HIGH (ref 1.7–2.4)

## 2018-08-24 MED ORDER — HALOPERIDOL LACTATE 5 MG/ML IJ SOLN
2.0000 mg | INTRAMUSCULAR | Status: DC | PRN
  Administered 2018-08-24: 2 mg via INTRAVENOUS
  Filled 2018-08-24: qty 1

## 2018-08-24 MED ORDER — CHLORHEXIDINE GLUCONATE CLOTH 2 % EX PADS: 6.0000 | MEDICATED_PAD | Freq: Every day | CUTANEOUS | Status: DC

## 2018-08-24 MED ORDER — HEPARIN SODIUM (PORCINE) 1000 UNIT/ML IJ SOLN
INTRAMUSCULAR | Status: AC
  Filled 2018-08-24: qty 3

## 2018-08-24 MED ORDER — HEPARIN SODIUM (PORCINE) 1000 UNIT/ML IJ SOLN
INTRAMUSCULAR | Status: AC
  Filled 2018-08-24: qty 1

## 2018-08-24 NOTE — Progress Notes (Signed)
Pt trying to hit staff , swearing at wife. Wife apologized for pt being verbally abusive and said he does talk like this at home. Pt given 2 mg IV Haldol per MD order.

## 2018-08-24 NOTE — Progress Notes (Signed)
[facility] Progress Note   Subjective:   Patient alert but agitated and paranoid, asking to " call the police", " I need to get out of here".   Physical Exam: GEN alert but confused HEENT eyes closed, EOMI NECK no JVD PULM clear anteriorly, muffled bases CV tachycardic, II/VI systolic murmur ABD nontender, +BS EXT 1-2 + bilat UE edema  NEURO nonfocal, repeats himself ACCESS RUE AVF + T/B.  Mult failed accesses  Dialysis: SW TTS 4h  98.5kg  2/2.25 bath  P2   Hep none     Assessment/Plan: 1. Septic shock: klebsiella bacteremia, recurrent.  Unclear source, on IV Rocephin per ID 2. C diff: on po Vanc  3. AMS: some ICU psychosis/ delirium, paranoid today 4. ESRD: SP CRRT 11/20-22, back to IHD. Use AVF then pulm temp cath as soon as possible. Didn't get HD yest due to staffing/ pt load. May need sedation for HD today. Will get HD again tomorrow due to holiday schedule.  5. Anemia of ESRD: no ESA as OP, Hb > 10, no need esa yet 6. MBD ckd: phos was low, coming up some, holding binders for now 7. Nutrition: renal diet when eating 8. Hypotension: would like to get off pressors and Precedex so we can do HD upstairs today  Kelly Splinter MD [facility] pgr [phone]   08/24/2018, 7:18 AM      Objective:   BP (!) 127/99   Pulse 60   Temp (!) 97.4 F (36.3 C) (Axillary)   Resp 19   Ht 6' (1.829 m)   Wt 99.6 kg   SpO2 97%   BMI 29.78 kg/m    Labs: BMET Recent Labs  Lab 08/19/18 2124 08/20/18 0257 08/21/18 0359 08/21/18 1626 08/22/18 0443 08/23/18 0334 08/24/18 0615  NA 138 138 139 137 137 131* 129*  K 5.5* 6.2* 5.3* 4.2 4.0 4.5 4.7  CL 103 103 104 104 102 100 99  CO2 21* 21* 21* 21* 21* 21* 20*  GLUCOSE 139* 168* 143* 93 55* 185* 226*  BUN 63* 67* 47* 29* 23 30* 48*  CREATININE 11.32* 11.49* 8.46* 4.64* 3.54* 4.45* 5.65*  CALCIUM 8.4* 8.5* 7.9* 8.3* 9.0 8.9 8.9  PHOS  --   --  5.3* 2.4* 2.7 3.9 5.7*   CBC Recent Labs  Lab  08/19/18 1708  08/20/18 0257 08/21/18 0358 08/22/18 0443 08/23/18 0334 08/24/18 0549  WBC 14.6*   < > 22.4* 20.8* 20.3* 15.8* 9.2  NEUTROABS 12.8*  --  19.5*  --   --  11.2* 6.0  HGB 12.6*   < > 10.0* 9.6* 10.6* 10.5* 10.9*  HCT 42.3   < > 33.5* 32.3* 34.4* 34.0* 36.2*  MCV 100.2*   < > 96.8 97.9 96.6 93.7 95.5  PLT 227   < > 236 153 116* 133* 133*   < > = values in this interval not displayed.    @IMGRELPRIORS @ Medications:    . allopurinol  100 mg Oral Daily  . aspirin  81 mg Oral Daily  . Chlorhexidine Gluconate Cloth  6 each Topical Q0600  . cinacalcet  90 mg Oral BID WC  . citalopram  10 mg Oral QHS  . colesevelam  1,875 mg Oral BID WC  . ezetimibe  10 mg Oral Daily  . famotidine  20 mg Oral Daily  . feeding supplement (NEPRO CARB STEADY)  237 mL Oral BID BM  . gabapentin  200 mg Oral QHS  . haloperidol lactate  5 mg Intravenous Once  . heparin injection (subcutaneous)  5,000 Units Subcutaneous Q8H  . insulin aspart  0-9 Units Subcutaneous TID WC  . mouth rinse  15 mL Mouth Rinse BID  . methimazole  2.5 mg Oral Daily  . midodrine  10 mg Oral TID WC  . multivitamin  1 tablet Oral Daily  . vancomycin  125 mg Oral Q6H

## 2018-08-24 NOTE — Progress Notes (Signed)
NAME:  [name], MRN:  [id number], DOB:  04-17-1951, LOS: 5 ADMISSION DATE:  08/19/2018, CONSULTATION DATE: 08/20/2018 REFERRING MD: Triad, CHIEF COMPLAINT: Hypotension  Brief History    67 year old male with extensive past medical history is well-documented who was in his normal state of poor health until 08/19/2018 at which time he was undergoing dialysis in outpatient setting.  He is unable to complete secondary to abdominal pain and low blood pressure.  He was taken at home but on the way home require admission to the hospital for continued fevers abdominal pain.  Noted to have a fever of 104.  Positive for chills.  Hospitalization was positive for bacteremia. 08/20/2018 he was taken to CT scan for abdominal CT and on return he was noted to be hypertensive with systolic blood pressure 70.  Pulmonary critical care was consulted at that time.  His blood pressure low he would follow commands and move all extremities.  Right bicep AV graft was noted to be pulsatile at time of examination.  He was transferred to intensive care unit where he may require CVVH and/or intubation in the future.  11/23-  needed prcedex since yesterday fro agitation and secondary neosnyephrine for low bp. Overnight precedex weaned -> improved delirum (partial capacity +) but also became verbally abusive to staff using F Word multiple times and demanding to stand and transfer to chair without giving RNs any time to console him. Advised him he was being verbally abusive to RNs. Wife did not appreciate my directives. Current infusions - D10 (hypoglyucemia, decreased po) , precedex and neo . Sugars now 190s even at time of agitation. Afebrile since 08/21/18. WBC improved. Significant  Diarrhea yesterday -> stopped at 2am today / Renal plans for HD v CRRT today/. ID recommending TEE and dc femoral line asap  Past Medical History  End-stage renal disease Ischemic cardiomyopathy Diabetes mellitus Hypertension Morbid  obesity Onychogryphosis   Significant Hospital Events   08/19/2018 admission 08/20/2018 after returning from CT scan was found to be hypotensive transferred to ICU 08/22/18 - Currently off vasopressor support.   agitated and confused today.  Not taking p.o.'s or nourishment having a hypoglycemic event.  Started on D10 and low-dose Ativan along with reinstitution of his Neurontin.   Consults:  08/20/2018 PCCM 08/19/2018 renal 08/21/2018 infectious disease  Procedures:  08/21/2018 right femoral HD catheter>>  Significant Diagnostic Tests:  08/20/2018 CT of the abdomen>> no acute process is identified Micro Data:  08/19/2018 blood cultures x2>> 1 with Klebsiella 1 with gram-negative rods>> 08/21/18  C Diff - Ag +ve, PCR +. Toxin - negative . No colitis on CT   Antimicrobials:  08/20/2018 meropenem 08/21/2018 08/19/2018 vancomycin 21 2019 08/21/2018 Rocephin IV (ID recommends 2 week totalRx) 08/21/2018 vancomycin ORAL  Interim history/subjective:   08/24/2018 - Used cPAP for 5h last night. Did not get HD yesterday; plans for today. No fevers. Still on percedex 1.2 and secondary neo at 66mcg. TEE pending tomorrow. Diarrhea resolved.  Intermittently agitated - yells out that he wants to go home and mxes swear words. . Delirious intermittently.   D10 at 50cc/h -> sugars 221.   Objective   Blood pressure 122/89, pulse 62, temperature (!) 97.4 F (36.3 C), temperature source Axillary, resp. rate (!) 28, height 6' (1.829 m), weight 99.6 kg, SpO2 96 %.        Intake/Output Summary (Last 24 hours) at 08/24/2018 0814 Last data filed at 08/24/2018 0600 Gross per 24 hour  Intake 1955.37 ml  Output -  Net 1955.37 ml   Filed Weights   08/21/18 0500 08/22/18 0458 08/23/18 0500  Weight: 101.5 kg 100 kg 99.6 kg   General Appearance:  Obese, decodntioned looking Head:  Normocephalic, without obvious abnormality, atraumatic Eyes:  PERRL - yes, conjunctiva/corneas - clear     Ears:   Normal external ear canals, both ears Nose:  G tube - no Throat:  ETT TUBE - no , OG tube - no Neck:  Supple,  No enlargement/tenderness/nodules Lungs: Clear to auscultation bilaterally,  Heart:  S1 and S2 normal, no murmur, CVP - no.  Pressors - yes neo Abdomen:  Soft, no masses, no organomegaly Genitalia / Rectal:  Not done Extremities:  Extremities- intact Skin:  ntact in exposed areas .  Neurologic:  Sedation - precdex gtt -> RASS - -2 to +2 with bad words . Moves all 4s - yes. CAM-ICU - positive . Orientation - can follow simple commands        LABS    PULMONARY Recent Labs  Lab 08/19/18 1716 08/20/18 1113  PHART  --  7.410  PCO2ART  --  32.7  PO2ART  --  82.3*  HCO3  --  19.7*  TCO2 28  --   O2SAT  --  94.3    CBC Recent Labs  Lab 08/22/18 0443 08/23/18 0334 08/24/18 0549  HGB 10.6* 10.5* 10.9*  HCT 34.4* 34.0* 36.2*  WBC 20.3* 15.8* 9.2  PLT 116* 133* 133*    COAGULATION Recent Labs  Lab 08/19/18 2124  INR 1.15    CARDIAC   Recent Labs  Lab 08/19/18 2124 08/20/18 0257  TROPONINI 0.06* 0.06*   No results for input(s): PROBNP in the last 168 hours.   CHEMISTRY Recent Labs  Lab 08/21/18 0358 08/21/18 0359 08/21/18 1626 08/22/18 0443 08/23/18 0334 08/24/18 0615  NA  --  139 137 137 131* 129*  K  --  5.3* 4.2 4.0 4.5 4.7  CL  --  104 104 102 100 99  CO2  --  21* 21* 21* 21* 20*  GLUCOSE  --  143* 93 55* 185* 226*  BUN  --  47* 29* 23 30* 48*  CREATININE  --  8.46* 4.64* 3.54* 4.45* 5.65*  CALCIUM  --  7.9* 8.3* 9.0 8.9 8.9  MG 1.7  --   --  2.6* 2.4 2.5*  PHOS  --  5.3* 2.4* 2.7 3.9 5.7*   Estimated Creatinine Clearance: 15.1 mL/min (A) (by C-G formula based on SCr of 5.65 mg/dL (H)).   LIVER Recent Labs  Lab 08/19/18 1618 08/19/18 2124 08/20/18 0257 08/21/18 0359 08/21/18 1626 08/24/18 0615  AST 41  --  39  --   --   --   ALT 49*  --  46*  --   --   --   ALKPHOS 128*  --  103  --   --   --   BILITOT 0.9  --  0.8  --    --   --   PROT 7.6  --  6.4*  --   --   --   ALBUMIN 3.1*  --  2.5* 2.4* 2.3* 2.0*  INR  --  1.15  --   --   --   --      INFECTIOUS Recent Labs  Lab 08/19/18 1716 08/19/18 2124 08/20/18 0017  LATICACIDVEN 2.76* 1.8 1.8  PROCALCITON  --  20.50  --      ENDOCRINE CBG (last 3)  Recent Labs    08/23/18 1213 08/23/18 1611 08/24/18 0011  GLUCAP 223* 139* 164*         IMAGING x48h  - image(s) personally visualized  -   highlighted in bold Dg Chest Port 1 View  Result Date: 08/23/2018 CLINICAL DATA:  Respiratory failure EXAM: PORTABLE CHEST 1 VIEW COMPARISON:  08/22/2018 FINDINGS: Cardiomegaly, pulmonary vascular congestion and mild bibasilar atelectasis again noted. No pneumothorax. Little significant change from the prior study. IMPRESSION: Unchanged appearance of the chest with pulmonary vascular congestion and mild bibasilar atelectasis. Electronically Signed   By: Margarette Canada M.D.   On: 08/23/2018 08:36      Resolved Hospital Problem list     Assessment & Plan:  #History of hypertension holding all antihypertensive at this time secondary to shock #Septic shock (f abdominal pain, high fevers of 104, chills and Klebsiella +/- C Diff;  Procalcitonin noted to be elevated 28)   - needing neo due to precedex. On mididrone  PLAN - MAP goal > 60 and sbp > 100 .  History of ischemic cardiomyopathy note latest 2D echo. ID concerned for endocarditis plan TEE pending .08/25/18   Bacteremia - Blood culture with Klebsiella pneumoniae sensitive to imipenem Positive C. Difficile   - no diarrhea x > 24h as of 08/24/18 AM  PLAN Per ID consult  h2 blockade Avoid PPI   ESRD - per renal (might need CRRT 08/23/18 due to neo need)   Diabetes mellitus Noted to be hypoglycemic 08/21/18./ On 08/23/18 - sugars 190s on D10   Plan Reduce d10 and monitor sugars   Hx of hyperthyroid - on methimazole at home (TSH 0.7 11/18 Plan  - continue methimazole  Hx of  Gout Plan  - hold colchicine - continue allopurinol  History of dementia - on home celexa, ambien, neurontin,   08/23/18 - restarted neurontin yesterday due to complaints of hand pain and wife keen on it  PLAN  - hold ambien  - continue neurontin - continue celexa - precedex gtt  -dc ativan prn   Best practice:  Diet: Low carbohydrate diet Pain/Anxiety/Delirium protocol (if indicated): precedex gtt VAP protocol (if indicated): None indicated at this time DVT prophylaxis: Subcu heparin GI prophylaxis: PPI change to H2 blockade due to C diff Glucose control: Sliding scale insulin noted to be hypoglycemic started on D10 08/22/2017 - back off 08/23/09 Mobility: Bedrest Code Status: Full code Family Communication: 10/20/2017 son updated bedside aware of father's confusion and agitation. On 08/23/18 - son Vicente Serene and wife communicated with . No family at bedside 08/24/18  Disposition: Intensive care unit    ATTESTATION & SIGNATURE   The patient DEJAN ANGERT is critically ill with multiple organ systems failure and requires high complexity decision making for assessment and support, frequent evaluation and titration of therapies, application of advanced monitoring technologies and extensive interpretation of multiple databases.   Critical Care Time devoted to patient care services described in this note is  30  Minutes. This time reflects time of care of this signee Dr Brand Males. This critical care time does not reflect procedure time, or teaching time or supervisory time of PA/NP/Med student/Med Resident etc but could involve care discussion time     Dr. Brand Males, M.D., [facility].C.P Pulmonary and Critical Care Medicine Staff Physician [place] [facility] Pager: [phone], If no answer or between  15:00h - 7:00h: call [phone]  08/24/2018 8:28 AM

## 2018-08-24 NOTE — Progress Notes (Signed)
Pt back from HD. Alert to self. VSS. Tele sitter notified of patient's return to room and to resume monitoring. Pt allowed RN to assess him at this time. Pt refused oral midodrine and vancomycin. RN was able to admin IV abx. Pt compliant with safety measures at this time. Nursing will continue to monitor.

## 2018-08-24 NOTE — Progress Notes (Signed)
Weaned down and off Precedex per MD order- had already weaned off Neo at 1115 using parameters for BP and MAP - Pt wide awake - yelling and swearing , uncooperative and telling people to get the [facility] away from him and that we are trying to kill him after explaining to pt that we would like to take his temperature - refused any bath or CHG wipes

## 2018-08-24 NOTE — Progress Notes (Signed)
Late Entry: Patient placed on CPAP by RN and tolerating well. No issues and patient was resting comfortably.

## 2018-08-24 NOTE — Progress Notes (Signed)
INFECTIOUS DISEASE PROGRESS NOTE  ID: [name] is a 67 y.o. male with  Principal Problem:   Sepsis ([place]) Active Problems:   Ischemic cardiomyopathy   OSA (obstructive sleep apnea)   Hyperthyroidism   Acute encephalopathy   DM (diabetes mellitus) type II controlled with renal manifestation ([name])   PAD (peripheral artery disease) ([place])   Status post coronary artery stent placement   ESRD on hemodialysis ([place])   Encounter for hemodialysis ([place])   Chronic GI bleeding   Chronic combined systolic (congestive) and diastolic (congestive) heart failure ([name])   Altered mental status   Gram-negative bacteremia  Subjective: Continued confusion when awake.  No further diarrhea  Abtx:  Anti-infectives (From admission, onward)   Start     Dose/Rate Route Frequency Ordered Stop   08/22/18 2000  cefTRIAXone (ROCEPHIN) 2 g in sodium chloride 0.9 % 100 mL IVPB     2 g 200 mL/hr over 30 Minutes Intravenous Every 24 hours 08/22/18 0924     08/22/18 1500  vancomycin (VANCOCIN) 50 mg/mL oral solution 125 mg     125 mg Oral Every 6 hours 08/22/18 0934     08/22/18 1000  vancomycin (VANCOCIN) 50 mg/mL oral solution 500 mg  Status:  Discontinued     500 mg Oral 4 times daily 08/22/18 0839 08/22/18 0932   08/21/18 1800  vancomycin (VANCOCIN) 50 mg/mL oral solution 125 mg  Status:  Discontinued     125 mg Oral 4 times daily 08/21/18 1652 08/22/18 0839   08/21/18 1200  vancomycin (VANCOCIN) IVPB 1000 mg/200 mL premix  Status:  Discontinued     1,000 mg 200 mL/hr over 60 Minutes Intravenous Every T-Th-Sa (Hemodialysis) 08/19/18 1737 08/20/18 0728   08/21/18 0800  meropenem (MERREM) 1 g in sodium chloride 0.9 % 100 mL IVPB  Status:  Discontinued     1 g 200 mL/hr over 30 Minutes Intravenous Every 12 hours 08/21/18 0224 08/22/18 0923   08/20/18 1800  ceFEPIme (MAXIPIME) 1 g in sodium chloride 0.9 % 100 mL IVPB  Status:  Discontinued     1 g 200 mL/hr over 30 Minutes Intravenous Every 24 hours  08/19/18 1737 08/20/18 1012   08/20/18 1700  cefTRIAXone (ROCEPHIN) 2 g in sodium chloride 0.9 % 100 mL IVPB  Status:  Discontinued     2 g 200 mL/hr over 30 Minutes Intravenous Every 24 hours 08/20/18 1012 08/20/18 1117   08/20/18 1200  meropenem (MERREM) 500 mg in sodium chloride 0.9 % 100 mL IVPB  Status:  Discontinued     500 mg 200 mL/hr over 30 Minutes Intravenous Every 24 hours 08/20/18 1117 08/21/18 0218   08/19/18 1800  metroNIDAZOLE (FLAGYL) IVPB 500 mg  Status:  Discontinued     500 mg 100 mL/hr over 60 Minutes Intravenous Every 8 hours 08/19/18 1709 08/20/18 0728   08/19/18 1730  vancomycin (VANCOCIN) 2,000 mg in sodium chloride 0.9 % 500 mL IVPB     2,000 mg 250 mL/hr over 120 Minutes Intravenous  Once 08/19/18 1709 08/19/18 2045   08/19/18 1715  ceFEPIme (MAXIPIME) 2 g in sodium chloride 0.9 % 100 mL IVPB     2 g 200 mL/hr over 30 Minutes Intravenous  Once 08/19/18 1709 08/19/18 1802      Medications:  Scheduled: . allopurinol  100 mg Oral Daily  . aspirin  81 mg Oral Daily  . Chlorhexidine Gluconate Cloth  6 each Topical Q0600  . Chlorhexidine Gluconate Cloth  6  each Topical [id number]  . cinacalcet  90 mg Oral BID WC  . citalopram  10 mg Oral QHS  . colesevelam  1,875 mg Oral BID WC  . ezetimibe  10 mg Oral Daily  . famotidine  20 mg Oral Daily  . feeding supplement (NEPRO CARB STEADY)  237 mL Oral BID BM  . gabapentin  200 mg Oral QHS  . haloperidol lactate  5 mg Intravenous Once  . heparin injection (subcutaneous)  5,000 Units Subcutaneous Q8H  . insulin aspart  0-9 Units Subcutaneous TID WC  . mouth rinse  15 mL Mouth Rinse BID  . methimazole  2.5 mg Oral Daily  . midodrine  10 mg Oral TID WC  . multivitamin  1 tablet Oral Daily  . vancomycin  125 mg Oral Q6H    Objective: Vital signs in last 24 hours: Temp:  [96.3 F (35.7 C)-97.4 F (36.3 C)] 96.3 F (35.7 C) (11/24 0819) Pulse Rate:  [58-122] 62 (11/24 0700) Resp:  [0-28] 28 (11/24 0700) BP:  (87-136)/(33-106) 122/89 (11/24 0700) SpO2:  [91 %-100 %] 96 % (11/24 0700)   General appearance: no distress Resp: clear to auscultation bilaterally Cardio: regular rate and rhythm GI: normal findings: bowel sounds normal and soft, non-tender Extremities: RUE avf- withdraws to palpation, echymoses.   Lab Results Recent Labs    08/23/18 0334 08/24/18 0549 08/24/18 0615  WBC 15.8* 9.2  --   HGB 10.5* 10.9*  --   HCT 34.0* 36.2*  --   NA 131*  --  129*  K 4.5  --  4.7  CL 100  --  99  CO2 21*  --  20*  BUN 30*  --  48*  CREATININE 4.45*  --  5.65*   Liver Panel Recent Labs    08/21/18 1626 08/24/18 0615  ALBUMIN 2.3* 2.0*   Sedimentation Rate No results for input(s): ESRSEDRATE in the last 72 hours. C-Reactive Protein No results for input(s): CRP in the last 72 hours.  Microbiology: Recent Results (from the past 240 hour(s))  Culture, blood (routine x 2)     Status: Abnormal   Collection Time: 08/19/18  5:07 PM  Result Value Ref Range Status   Specimen Description BLOOD RIGHT ANTECUBITAL  Final   Special Requests   Final    BOTTLES DRAWN AEROBIC AND ANAEROBIC Blood Culture adequate volume   Culture  Setup Time   Final    GRAM NEGATIVE RODS IN BOTH AEROBIC AND ANAEROBIC BOTTLES CRITICAL RESULT CALLED TO, READ BACK BY AND VERIFIED WITHLenna Sciara [facility] PHARMD [id number] 08/20/18 A BROWNING    Culture (A)  Final    KLEBSIELLA PNEUMONIAE SUSCEPTIBILITIES PERFORMED ON PREVIOUS CULTURE WITHIN THE LAST 5 DAYS. Performed at [facility], [place] [address] [place], [place] [address]    Report Status 08/22/2018 FINAL  Final  Culture, blood (routine x 2)     Status: Abnormal   Collection Time: 08/19/18  5:23 PM  Result Value Ref Range Status   Specimen Description BLOOD RIGHT ARM  Final   Special Requests   Final    BOTTLES DRAWN AEROBIC AND ANAEROBIC Blood Culture results may not be optimal due to an inadequate volume of blood received in culture bottles   Culture  Setup  Time   Final    GRAM NEGATIVE RODS IN BOTH AEROBIC AND ANAEROBIC BOTTLES CRITICAL RESULT CALLED TO, READ BACK BY AND VERIFIED WITHKarsten Ro [facility] [id number] 08/20/18 A BROWNING Performed at [facility], [place] [address], [place], [place] [address]    Culture KLEBSIELLA PNEUMONIAE (A)  Final   Report Status 08/22/2018 FINAL  Final   Organism ID, Bacteria KLEBSIELLA PNEUMONIAE  Final      Susceptibility   Klebsiella pneumoniae - MIC*    AMPICILLIN >=32 RESISTANT Resistant     CEFAZOLIN <=4 SENSITIVE Sensitive     CEFEPIME <=1 SENSITIVE Sensitive     CEFTAZIDIME <=1 SENSITIVE Sensitive     CEFTRIAXONE <=1 SENSITIVE Sensitive     CIPROFLOXACIN <=0.25 SENSITIVE Sensitive     GENTAMICIN <=1 SENSITIVE Sensitive     IMIPENEM <=0.25 SENSITIVE Sensitive     TRIMETH/SULFA <=20 SENSITIVE Sensitive     AMPICILLIN/SULBACTAM 16 INTERMEDIATE Intermediate     PIP/TAZO 16 SENSITIVE Sensitive     Extended ESBL NEGATIVE Sensitive     * KLEBSIELLA PNEUMONIAE  Blood Culture ID Panel (Reflexed)     Status: Abnormal   Collection Time: 08/19/18  5:23 PM  Result Value Ref Range Status   Enterococcus species NOT DETECTED NOT DETECTED Final   Listeria monocytogenes NOT DETECTED NOT DETECTED Final   Staphylococcus species NOT DETECTED NOT DETECTED Final   Staphylococcus aureus (BCID) NOT DETECTED NOT DETECTED Final   Streptococcus species NOT DETECTED NOT DETECTED Final   Streptococcus agalactiae NOT DETECTED NOT DETECTED Final   Streptococcus pneumoniae NOT DETECTED NOT DETECTED Final   Streptococcus pyogenes NOT DETECTED NOT DETECTED Final   Acinetobacter baumannii NOT DETECTED NOT DETECTED Final   Enterobacteriaceae species DETECTED (A) NOT DETECTED Final    Comment: Enterobacteriaceae represent a large family of gram-negative bacteria, not a single organism. CRITICAL RESULT CALLED TO, READ BACK BY AND VERIFIED WITH: J [facility] PHARMD [id number] 08/20/18 A BROWNING    Enterobacter cloacae complex NOT  DETECTED NOT DETECTED Final   Escherichia coli NOT DETECTED NOT DETECTED Final   Klebsiella oxytoca NOT DETECTED NOT DETECTED Final   Klebsiella pneumoniae DETECTED (A) NOT DETECTED Final    Comment: CRITICAL RESULT CALLED TO, READ BACK BY AND VERIFIED WITH: Karsten Ro PHARMD [id number] 08/20/18 A BROWNING    Proteus species NOT DETECTED NOT DETECTED Final   Serratia marcescens NOT DETECTED NOT DETECTED Final   Carbapenem resistance NOT DETECTED NOT DETECTED Final   Haemophilus influenzae NOT DETECTED NOT DETECTED Final   Neisseria meningitidis NOT DETECTED NOT DETECTED Final   Pseudomonas aeruginosa NOT DETECTED NOT DETECTED Final   Candida albicans NOT DETECTED NOT DETECTED Final   Candida glabrata NOT DETECTED NOT DETECTED Final   Candida krusei NOT DETECTED NOT DETECTED Final   Candida parapsilosis NOT DETECTED NOT DETECTED Final   Candida tropicalis NOT DETECTED NOT DETECTED Final    Comment: Performed at [facility], [place] [address], [place], [place] [address]  MRSA PCR Screening     Status: None   Collection Time: 08/20/18 12:02 PM  Result Value Ref Range Status   MRSA by PCR NEGATIVE NEGATIVE Final    Comment:        The GeneXpert MRSA Assay (FDA approved for NASAL specimens only), is one component of a comprehensive MRSA colonization surveillance program. It is not intended to diagnose MRSA infection nor to guide or monitor treatment for MRSA infections. Performed at [facility], [place] [address] [place], [place] [address]   C difficile quick scan w PCR reflex     Status: Abnormal   Collection Time: 08/21/18 11:24 AM  Result Value Ref Range Status   C Diff  antigen POSITIVE (A) NEGATIVE Final   C Diff toxin NEGATIVE NEGATIVE Final   C Diff interpretation Results are indeterminate. See PCR results.  Final    Comment: Performed at [facility], [place] [address], [place], [place] [address]  C. Diff by PCR, Reflexed     Status: Abnormal   Collection Time:  08/21/18 11:24 AM  Result Value Ref Range Status   Toxigenic C. Difficile by PCR POSITIVE (A) NEGATIVE Final    Comment: Positive for toxigenic C. difficile with little to no toxin production. Only treat if clinical presentation suggests symptomatic illness. Performed at [facility], [place] [address], [place], [place] [address]     Studies/Results: Dg Chest Port 1 View  Result Date: 08/23/2018 CLINICAL DATA:  Respiratory failure EXAM: PORTABLE CHEST 1 VIEW COMPARISON:  08/22/2018 FINDINGS: Cardiomegaly, pulmonary vascular congestion and mild bibasilar atelectasis again noted. No pneumothorax. Little significant change from the prior study. IMPRESSION: Unchanged appearance of the chest with pulmonary vascular congestion and mild bibasilar atelectasis. Electronically Signed   By: Margarette Canada M.D.   On: 08/23/2018 08:36     Assessment/Plan: Klebsiella bacteremia C diff ESRD Delrium Lumbar hardware (L2-3, L5-S1)  Total days of antibiotics: 20merrem                                         2/10 po vanco  No BM since 2am yesterday.                                                                   Temps better, WBC normal.  CT colonic diverticula, no source of infection.  Could change him to ceftriaxone as his sensi indicate, however need clarification of his allergy status when he is more coherent.   Concern about fistula however u/s clean, vasc has eval.  Repeat MRI of spine? TEE in AM         Bobby Rumpf MD, FACP Infectious Diseases (pager) [phone] [email or web address] 08/24/2018, 8:35 AM  LOS: 5 days

## 2018-08-24 NOTE — Progress Notes (Signed)
Call from D/w DR Jonnie Finner  Wants to trial patient off precedex and off neo to see if he can be moved without risk to HD unit for dialusis D/w RN Everlene Other who will turn both off and assess closely     SIGNATURE    Dr. Brand Males, M.D., F.C.C.P,  Pulmonary and Critical Care Medicine Staff Physician, [place] Director - Interstitial Lung Disease  Program  Pulmonary [place] at [place], [place], [address]  Pager: [phone], If no answer or between  15:00h - 7:00h: call [phone] Telephone: [phone]  11:51 AM 08/24/2018

## 2018-08-25 ENCOUNTER — Inpatient Hospital Stay (HOSPITAL_COMMUNITY): Payer: Medicare Other | Admitting: Anesthesiology

## 2018-08-25 ENCOUNTER — Encounter (HOSPITAL_COMMUNITY): Payer: Self-pay

## 2018-08-25 ENCOUNTER — Encounter (HOSPITAL_COMMUNITY)
Admission: EM | Disposition: A | Discharge status: Home Health | Payer: Self-pay | Source: Home / Self Care | Attending: Internal Medicine

## 2018-08-25 ENCOUNTER — Other Ambulatory Visit (HOSPITAL_COMMUNITY): Payer: Medicare Other

## 2018-08-25 DIAGNOSIS — R7881 Bacteremia: Secondary | ICD-10-CM

## 2018-08-25 HISTORY — PX: TEE WITHOUT CARDIOVERSION: SHX5443

## 2018-08-25 LAB — CBC WITH DIFFERENTIAL/PLATELET
Abs Immature Granulocytes: 0.28 10*3/uL — ABNORMAL HIGH (ref 0.00–0.07)
BASOS PCT: 0 %
Basophils Absolute: 0 10*3/uL (ref 0.0–0.1)
EOS ABS: 0.2 10*3/uL (ref 0.0–0.5)
EOS PCT: 2 %
HEMATOCRIT: 32.8 % — AB (ref 39.0–52.0)
Hemoglobin: 10.4 g/dL — ABNORMAL LOW (ref 13.0–17.0)
Immature Granulocytes: 3 %
Lymphocytes Relative: 19 %
Lymphs Abs: 1.9 10*3/uL (ref 0.7–4.0)
MCH: 29.4 pg (ref 26.0–34.0)
MCHC: 31.7 g/dL (ref 30.0–36.0)
MCV: 92.7 fL (ref 80.0–100.0)
MONO ABS: 1.8 10*3/uL — AB (ref 0.1–1.0)
MONOS PCT: 18 %
Neutro Abs: 5.8 10*3/uL (ref 1.7–7.7)
Neutrophils Relative %: 58 %
PLATELETS: 152 10*3/uL (ref 150–400)
RBC: 3.54 MIL/uL — ABNORMAL LOW (ref 4.22–5.81)
RDW: 17.2 % — AB (ref 11.5–15.5)
WBC: 10.1 10*3/uL (ref 4.0–10.5)
nRBC: 0 % (ref 0.0–0.2)

## 2018-08-25 LAB — BASIC METABOLIC PANEL
ANION GAP: 10 (ref 5–15)
BUN: 36 mg/dL — ABNORMAL HIGH (ref 8–23)
CALCIUM: 8.4 mg/dL — AB (ref 8.9–10.3)
CO2: 25 mmol/L (ref 22–32)
CREATININE: 5.26 mg/dL — AB (ref 0.61–1.24)
Chloride: 101 mmol/L (ref 98–111)
GFR, EST AFRICAN AMERICAN: 12 mL/min — AB (ref >60)
GFR, EST NON AFRICAN AMERICAN: 10 mL/min — AB (ref >60)
Glucose, Bld: 96 mg/dL (ref 70–99)
Potassium: 3.9 mmol/L (ref 3.5–5.1)
Sodium: 136 mmol/L (ref 135–145)

## 2018-08-25 LAB — GLUCOSE, CAPILLARY
GLUCOSE-CAPILLARY: 88 mg/dL (ref 70–99)
Glucose-Capillary: 88 mg/dL (ref 70–99)
Glucose-Capillary: 102 mg/dL — ABNORMAL HIGH (ref 70–99)
Glucose-Capillary: 93 mg/dL (ref 70–99)

## 2018-08-25 LAB — MAGNESIUM
MAGNESIUM: 1.3 mg/dL — AB (ref 1.7–2.4)
Magnesium: 2.3 mg/dL (ref 1.7–2.4)

## 2018-08-25 LAB — PHOSPHORUS
Phosphorus: 2.7 mg/dL (ref 2.5–4.6)
Phosphorus: 4.9 mg/dL — ABNORMAL HIGH (ref 2.5–4.6)

## 2018-08-25 SURGERY — ECHOCARDIOGRAM, TRANSESOPHAGEAL
Anesthesia: Monitor Anesthesia Care

## 2018-08-25 MED ORDER — LIDOCAINE 2% (20 MG/ML) 5 ML SYRINGE
INTRAMUSCULAR | Status: DC | PRN
  Administered 2018-08-25: 100 mg via INTRAVENOUS

## 2018-08-25 MED ORDER — PROPOFOL 10 MG/ML IV BOLUS
INTRAVENOUS | Status: DC | PRN
  Administered 2018-08-25: 20 mg via INTRAVENOUS

## 2018-08-25 MED ORDER — HALOPERIDOL LACTATE 5 MG/ML IJ SOLN: 1.0000 mg | Freq: Four times a day (QID) | INTRAMUSCULAR | Status: DC | PRN

## 2018-08-25 MED ORDER — SODIUM CHLORIDE 0.9 % IV SOLN: INTRAVENOUS | Status: DC

## 2018-08-25 MED ORDER — HALOPERIDOL LACTATE 5 MG/ML IJ SOLN: 2.0000 mg | INTRAMUSCULAR | Status: DC | PRN

## 2018-08-25 MED ORDER — SODIUM CHLORIDE 0.9 % IV SOLN
INTRAVENOUS | Status: DC
  Administered 2018-08-25: 500 mL via INTRAVENOUS

## 2018-08-25 MED ORDER — PROPOFOL 500 MG/50ML IV EMUL
INTRAVENOUS | Status: DC | PRN
  Administered 2018-08-25: 75 ug/kg/min via INTRAVENOUS

## 2018-08-25 NOTE — Progress Notes (Signed)
NAME:  [name], MRN:  [id number], DOB:  05/16/1951, LOS: 6 ADMISSION DATE:  08/19/2018, CONSULTATION DATE: 08/20/2018 REFERRING MD: [facility], CHIEF COMPLAINT: Hypotension  Brief History    67 year old male with extensive past medical history is well-documented who was in his normal state of poor health until 08/19/2018 at which time he was undergoing dialysis in outpatient setting.  He is unable to complete secondary to abdominal pain and low blood pressure.  He was taken at home but on the way home require admission to the hospital for continued fevers abdominal pain.  Noted to have a fever of 104.  Positive for chills.  Hospitalization was positive for bacteremia. 08/20/2018 he was taken to CT scan for abdominal CT and on return he was noted to be hypertensive with systolic blood pressure 70.  Pulmonary critical care was consulted at that time.  His blood pressure low he would follow commands and move all extremities.  Right bicep AV graft was noted to be pulsatile at time of examination.  He was transferred to intensive care unit where he may require CVVH and/or intubation in the future.  11/23-  needed prcedex since yesterday fro agitation and secondary neosnyephrine for low bp. Overnight precedex weaned -> improved delirum (partial capacity +) but also became verbally abusive to staff using F Word multiple times and demanding to stand and transfer to chair without giving RNs any time to console him. Advised him he was being verbally abusive to RNs. Wife did not appreciate my directives. Current infusions - D10 (hypoglyucemia, decreased po) , precedex and neo . Sugars now 190s even at time of agitation. Afebrile since 08/21/18. WBC improved. Significant  Diarrhea yesterday -> stopped at 2am today / Renal plans for HD v CRRT today/. ID recommending TEE and dc femoral line asap  Past Medical History  End-stage renal disease Ischemic cardiomyopathy Diabetes mellitus Hypertension Morbid  obesity Onychogryphosis   Significant Hospital Events   08/19/2018 admission 08/20/2018 after returning from CT scan was found to be hypotensive transferred to ICU 08/22/18 - Currently off vasopressor support.   agitated and confused today.  Not taking p.o.'s or nourishment having a hypoglycemic event.  Started on D10 and low-dose Ativan along with reinstitution of his Neurontin.   Consults:  08/20/2018 PCCM 08/19/2018 renal 08/21/2018 infectious disease  Procedures:  08/21/2018 right femoral HD catheter>>  Significant Diagnostic Tests:  08/20/2018 CT of the abdomen>> no acute process is identified Micro Data:  08/19/2018 blood cultures x2>> 1 with Klebsiella 1 with gram-negative rods>> 08/21/18  C Diff - Ag +ve, PCR +. Toxin - negative . No colitis on CT   Antimicrobials:  08/20/2018 meropenem 08/21/2018 08/19/2018 vancomycin 21 2019 08/21/2018 Rocephin IV (ID recommends 2 week totalRx) 08/21/2018 vancomycin ORAL  Interim history/subjective:   Clear this morning, denies pain.   Objective   Blood pressure (!) 166/130, pulse 91, temperature 97.8 F (36.6 C), temperature source Oral, resp. rate 19, height 6' (1.829 m), weight 100.3 kg, SpO2 97 %.        Intake/Output Summary (Last 24 hours) at 08/25/2018 2048 Last data filed at 08/25/2018 1900 Gross per 24 hour  Intake 525.13 ml  Output 0 ml  Net 525.13 ml   Filed Weights   08/22/18 0458 08/23/18 0500 08/25/18 0500  Weight: 100 kg 99.6 kg 100.3 kg   General Appearance:  Obese, decodntioned looking Head:  Normocephalic, without obvious abnormality, atraumatic Eyes:  PERRL - yes, conjunctiva/corneas - clear     Ears:  Normal external ear canals, both ears Nose:  G tube - no Throat:  ETT TUBE - no , OG tube - no Neck:  Supple,  No enlargement/tenderness/nodules Lungs: Clear to auscultation bilaterally,  Heart:  S1 and S2 normal, no murmur, CVP - no.  Pressors - off Abdomen:  Soft, no masses, no  organomegaly Genitalia / Rectal:  Not done Extremities:  Extremities- intact Skin:  ntact in exposed areas .  Neurologic:  Off sedation and oriented with no focal deficits.    LABS    PULMONARY Recent Labs  Lab 08/19/18 1716 08/20/18 1113  PHART  --  7.410  PCO2ART  --  32.7  PO2ART  --  82.3*  HCO3  --  19.7*  TCO2 28  --   O2SAT  --  94.3    CBC Recent Labs  Lab 08/23/18 0334 08/24/18 0549 08/25/18 0548  HGB 10.5* 10.9* 10.4*  HCT 34.0* 36.2* 32.8*  WBC 15.8* 9.2 10.1  PLT 133* 133* 152    COAGULATION Recent Labs  Lab 08/19/18 2124  INR 1.15    CARDIAC   Recent Labs  Lab 08/19/18 2124 08/20/18 0257  TROPONINI 0.06* 0.06*   No results for input(s): PROBNP in the last 168 hours.   CHEMISTRY Recent Labs  Lab 08/21/18 1626 08/22/18 0443 08/23/18 0334 08/24/18 0615 08/25/18 0457 08/25/18 0548  NA 137 137 131* 129*  --  136  K 4.2 4.0 4.5 4.7  --  3.9  CL 104 102 100 99  --  101  CO2 21* 21* 21* 20*  --  25  GLUCOSE 93 55* 185* 226*  --  96  BUN 29* 23 30* 48*  --  36*  CREATININE 4.64* 3.54* 4.45* 5.65*  --  5.26*  CALCIUM 8.3* 9.0 8.9 8.9  --  8.4*  MG  --  2.6* 2.4 2.5* 1.3* 2.3  PHOS 2.4* 2.7 3.9 5.7* 2.7 4.9*   Estimated Creatinine Clearance: 16.3 mL/min (A) (by C-G formula based on SCr of 5.26 mg/dL (H)).   LIVER Recent Labs  Lab 08/19/18 1618 08/19/18 2124 08/20/18 0257 08/21/18 0359 08/21/18 1626 08/24/18 0615  AST 41  --  39  --   --   --   ALT 49*  --  46*  --   --   --   ALKPHOS 128*  --  103  --   --   --   BILITOT 0.9  --  0.8  --   --   --   PROT 7.6  --  6.4*  --   --   --   ALBUMIN 3.1*  --  2.5* 2.4* 2.3* 2.0*  INR  --  1.15  --   --   --   --      INFECTIOUS Recent Labs  Lab 08/19/18 1716 08/19/18 2124 08/20/18 0017  LATICACIDVEN 2.76* 1.8 1.8  PROCALCITON  --  20.50  --      ENDOCRINE CBG (last 3)  Recent Labs    08/25/18 0751 08/25/18 1136 08/25/18 1840  GLUCAP 88 102* 93          IMAGING x48h  - image(s) personally visualized  -   highlighted in bold No results found.    Resolved Hospital Problem list     Assessment & Plan:  #History of hypertension holding all antihypertensive at this time secondary to shock #Septic shock (f abdominal pain, high fevers of 104, chills and Klebsiella +/- C Diff;  Procalcitonin noted to be elevated 28)  Off pressors  PLAN - MAP goal > 60 and sbp > 100 .  History of ischemic cardiomyopathy note latest 2D echo. ID concerned for endocarditis plan TEE pending .08/25/18   Bacteremia - Blood culture with Klebsiella pneumoniae sensitive to imipenem Positive C. Difficile   - no diarrhea x > 24h as of 08/24/18 AM  PLAN Per ID consult  h2 blockade Avoid PPI   ESRD - per renal (might need CRRT 08/23/18 due to neo need)   Diabetes mellitus Noted to be hypoglycemic 08/21/18./ On 08/23/18 - sugars 190s on D10   Plan Reduce d10 and monitor sugars   Hx of hyperthyroid - on methimazole at home (TSH 0.7 11/18 Plan  - continue methimazole  Hx of Gout Plan  - hold colchicine - continue allopurinol  History of dementia - on home celexa, ambien, neurontin,   08/23/18 - restarted neurontin yesterday due to complaints of hand pain and wife keen on it  PLAN  now clear.   Best practice:  Diet: Low carbohydrate diet Pain/Anxiety/Delirium protocol (if indicated): precedex gtt VAP protocol (if indicated): None indicated at this time DVT prophylaxis: Subcu heparin GI prophylaxis: PPI change to H2 blockade due to C diff Glucose control: Sliding scale insulin noted to be hypoglycemic started on D10 08/22/2017 - back off 08/23/09 Mobility: Bedrest Code Status: Full code Family Communication: 10/20/2017 son updated bedside aware of father's confusion and agitation. On 08/23/18 - son Vicente Serene and wife communicated with . No family at bedside 08/24/18  Disposition: Transfer to ward after TEE and  dialysis    [place], MD [facility] ICU Physician [place]  Pager: [phone] Mobile: [phone] After hours: [phone].   08/25/2018 8:48 PM

## 2018-08-25 NOTE — Progress Notes (Signed)
[name] Progress Note   Subjective:   Had HD yest w/ haldol 2mg  IV pre med, was a little verbally abusive in HD but cooperated.  We used his AVF.   Physical Exam: GEN alert but confused HEENT eyes closed, EOMI NECK no JVD PULM clear anteriorly, muffled bases CV tachycardic, II/VI systolic murmur ABD nontender, +BS EXT 1-2 + bilat UE edema  NEURO nonfocal, repeats himself ACCESS RUE AVF + T/B.  Mult failed accesses  Dialysis: SW TTS 4h  98.5kg  2/2.25 bath  P2   Hep none     Assessment/Plan: 1. Septic shock: klebsiella bacteremia, recurrent.  Unclear source, on IV Rocephin per ID 2. C diff: on po Vanc  3. AMS: some ICU psychosis/ delirium, had this once before after his BKA surg per the son but not this bad.  Got haldol IV pre HD yest but QTc long so will have to use Ativan or something similar today if needed.  However he seems better mentally this am, communicating directly and paranoid speech is less.  4. ESRD: SP CRRT 11/20-22, back to IHD now.  HD today on holiday schedule. Using AVF. DC temp HD cath.  5. Anemia of ESRD: no ESA as OP, Hb > 10, no need esa yet 6. MBD ckd: phos was low, coming up some, holding binders for now 7. Nutrition: renal diet when eating  Kelly Splinter MD [facility] pgr [phone]   08/25/2018, 8:35 AM      Objective:   BP (!) 162/81 (BP Location: Left Wrist)   Pulse 80   Temp 98.4 F (36.9 C) (Axillary)   Resp 19   Ht 6' (1.829 m)   Wt 100.3 kg   SpO2 96%   BMI 29.99 kg/m    Labs: BMET Recent Labs  Lab 08/20/18 0257 08/21/18 0359 08/21/18 1626 08/22/18 0443 08/23/18 0334 08/24/18 0615 08/25/18 0457 08/25/18 0548  NA 138 139 137 137 131* 129*  --  136  K 6.2* 5.3* 4.2 4.0 4.5 4.7  --  3.9  CL 103 104 104 102 100 99  --  101  CO2 21* 21* 21* 21* 21* 20*  --  25  GLUCOSE 168* 143* 93 55* 185* 226*  --  96  BUN 67* 47* 29* 23 30* 48*  --  36*  CREATININE 11.49* 8.46* 4.64* 3.54* 4.45*  5.65*  --  5.26*  CALCIUM 8.5* 7.9* 8.3* 9.0 8.9 8.9  --  8.4*  PHOS  --  5.3* 2.4* 2.7 3.9 5.7* 2.7 4.9*   CBC Recent Labs  Lab 08/20/18 0257  08/22/18 0443 08/23/18 0334 08/24/18 0549 08/25/18 0548  WBC 22.4*   < > 20.3* 15.8* 9.2 10.1  NEUTROABS 19.5*  --   --  11.2* 6.0 5.8  HGB 10.0*   < > 10.6* 10.5* 10.9* 10.4*  HCT 33.5*   < > 34.4* 34.0* 36.2* 32.8*  MCV 96.8   < > 96.6 93.7 95.5 92.7  PLT 236   < > 116* 133* 133* 152   < > = values in this interval not displayed.    @IMGRELPRIORS @ Medications:    . allopurinol  100 mg Oral Daily  . aspirin  81 mg Oral Daily  . cinacalcet  90 mg Oral BID WC  . citalopram  10 mg Oral QHS  . colesevelam  1,875 mg Oral BID WC  . ezetimibe  10 mg Oral Daily  . famotidine  20 mg Oral Daily  .  feeding supplement (NEPRO CARB STEADY)  237 mL Oral BID BM  . gabapentin  200 mg Oral QHS  . heparin injection (subcutaneous)  5,000 Units Subcutaneous Q8H  . insulin aspart  0-9 Units Subcutaneous TID WC  . mouth rinse  15 mL Mouth Rinse BID  . methimazole  2.5 mg Oral Daily  . midodrine  10 mg Oral TID WC  . multivitamin  1 tablet Oral Daily  . vancomycin  125 mg Oral Q6H

## 2018-08-25 NOTE — H&P (View-Only) (Signed)
[facility] Progress Note   Subjective:   Had HD yest w/ haldol 2mg  IV pre med, was a little verbally abusive in HD but cooperated.  We used his AVF.   Physical Exam: GEN alert but confused HEENT eyes closed, EOMI NECK no JVD PULM clear anteriorly, muffled bases CV tachycardic, II/VI systolic murmur ABD nontender, +BS EXT 1-2 + bilat UE edema  NEURO nonfocal, repeats himself ACCESS RUE AVF + T/B.  Mult failed accesses  Dialysis: SW TTS 4h  98.5kg  2/2.25 bath  P2   Hep none     Assessment/Plan: 1. Septic shock: klebsiella bacteremia, recurrent.  Unclear source, on IV Rocephin per ID 2. C diff: on po Vanc  3. AMS: some ICU psychosis/ delirium, had this once before after his BKA surg per the son but not this bad.  Got haldol IV pre HD yest but QTc long so will have to use Ativan or something similar today if needed.  However he seems better mentally this am, communicating directly and paranoid speech is less.  4. ESRD: SP CRRT 11/20-22, back to IHD now.  HD today on holiday schedule. Using AVF. DC temp HD cath.  5. Anemia of ESRD: no ESA as OP, Hb > 10, no need esa yet 6. MBD ckd: phos was low, coming up some, holding binders for now 7. Nutrition: renal diet when eating  Kelly Splinter MD [facility] pgr [phone]   08/25/2018, 8:35 AM      Objective:   BP (!) 162/81 (BP Location: Left Wrist)   Pulse 80   Temp 98.4 F (36.9 C) (Axillary)   Resp 19   Ht 6' (1.829 m)   Wt 100.3 kg   SpO2 96%   BMI 29.99 kg/m    Labs: BMET Recent Labs  Lab 08/20/18 0257 08/21/18 0359 08/21/18 1626 08/22/18 0443 08/23/18 0334 08/24/18 0615 08/25/18 0457 08/25/18 0548  NA 138 139 137 137 131* 129*  --  136  K 6.2* 5.3* 4.2 4.0 4.5 4.7  --  3.9  CL 103 104 104 102 100 99  --  101  CO2 21* 21* 21* 21* 21* 20*  --  25  GLUCOSE 168* 143* 93 55* 185* 226*  --  96  BUN 67* 47* 29* 23 30* 48*  --  36*  CREATININE 11.49* 8.46* 4.64* 3.54* 4.45*  5.65*  --  5.26*  CALCIUM 8.5* 7.9* 8.3* 9.0 8.9 8.9  --  8.4*  PHOS  --  5.3* 2.4* 2.7 3.9 5.7* 2.7 4.9*   CBC Recent Labs  Lab 08/20/18 0257  08/22/18 0443 08/23/18 0334 08/24/18 0549 08/25/18 0548  WBC 22.4*   < > 20.3* 15.8* 9.2 10.1  NEUTROABS 19.5*  --   --  11.2* 6.0 5.8  HGB 10.0*   < > 10.6* 10.5* 10.9* 10.4*  HCT 33.5*   < > 34.4* 34.0* 36.2* 32.8*  MCV 96.8   < > 96.6 93.7 95.5 92.7  PLT 236   < > 116* 133* 133* 152   < > = values in this interval not displayed.    @IMGRELPRIORS @ Medications:    . allopurinol  100 mg Oral Daily  . aspirin  81 mg Oral Daily  . cinacalcet  90 mg Oral BID WC  . citalopram  10 mg Oral QHS  . colesevelam  1,875 mg Oral BID WC  . ezetimibe  10 mg Oral Daily  . famotidine  20 mg Oral Daily  .  feeding supplement (NEPRO CARB STEADY)  237 mL Oral BID BM  . gabapentin  200 mg Oral QHS  . heparin injection (subcutaneous)  5,000 Units Subcutaneous Q8H  . insulin aspart  0-9 Units Subcutaneous TID WC  . mouth rinse  15 mL Mouth Rinse BID  . methimazole  2.5 mg Oral Daily  . midodrine  10 mg Oral TID WC  . multivitamin  1 tablet Oral Daily  . vancomycin  125 mg Oral Q6H

## 2018-08-25 NOTE — Anesthesia Preprocedure Evaluation (Addendum)
Anesthesia Evaluation  Patient identified by MRN, date of birth, ID band Patient awake    Reviewed: Allergy & Precautions, NPO status , Patient's Chart, lab work & pertinent test results  Airway Mallampati: III  TM Distance: >3 FB Neck ROM: Full    Dental  (+) Chipped,    Pulmonary sleep apnea and Continuous Positive Airway Pressure Ventilation , former smoker,    Pulmonary exam normal breath sounds clear to auscultation       Cardiovascular hypertension, + CAD, + Past MI, + Cardiac Stents, + Peripheral Vascular Disease and +CHF  Normal cardiovascular exam Rhythm:Regular Rate:Normal  Cardiomyopathy  ECG: ST, rate 101. Sinus tachycardia Borderline prolonged QT interval  ECHO: Reduced LV EF, estimated at 40-45% with visual, biplane, and speckle tracking estimates. Reduced GLS.     Neuro/Psych  Headaches, PSYCHIATRIC DISORDERS Depression Dementia PERIPHERAL NEUROPATHY  Neuromuscular disease    GI/Hepatic Neg liver ROS, PUD, GERD  Medicated and Controlled,  Endo/Other  diabetes  Renal/GU ESRF and DialysisRenal disease     Musculoskeletal Gout    Abdominal   Peds  Hematology  (+) anemia ,   Anesthesia Other Findings BACTEREMIA  Reproductive/Obstetrics                            Anesthesia Physical Anesthesia Plan  ASA: IV  Anesthesia Plan: MAC   Post-op Pain Management:    Induction: Intravenous  PONV Risk Score and Plan: 1 and Propofol infusion and Treatment may vary due to age or medical condition  Airway Management Planned: Nasal Cannula  Additional Equipment:   Intra-op Plan:   Post-operative Plan:   Informed Consent: I have reviewed the patients History and Physical, chart, labs and discussed the procedure including the risks, benefits and alternatives for the proposed anesthesia with the patient or authorized representative who has indicated his/her understanding and  acceptance.   Dental advisory given  Plan Discussed with: CRNA  Anesthesia Plan Comments:         Anesthesia Quick Evaluation

## 2018-08-25 NOTE — Progress Notes (Signed)
Placed patient on CPAP, vi aFFM 8.0 cm H20 (previosuly set)

## 2018-08-25 NOTE — Anesthesia Postprocedure Evaluation (Signed)
Anesthesia Post Note  Patient: [name]  Procedure(s) Performed: TRANSESOPHAGEAL ECHOCARDIOGRAM (TEE) (N/A )     Patient location during evaluation: PACU Anesthesia Type: MAC Level of consciousness: awake and alert Pain management: pain level controlled Vital Signs Assessment: post-procedure vital signs reviewed and stable Respiratory status: spontaneous breathing, nonlabored ventilation, respiratory function stable and patient connected to nasal cannula oxygen Cardiovascular status: stable and blood pressure returned to baseline Postop Assessment: no apparent nausea or vomiting Anesthetic complications: no    Last Vitals:  Vitals:   08/25/18 2000 08/25/18 2006  BP: (!) 166/130   Pulse: 91   Resp: 19   Temp:  36.6 C  SpO2: 97%     Last Pain:  Vitals:   08/25/18 2006  TempSrc: Oral  PainSc:                  Karyl Kinnier Mckenlee Mangham

## 2018-08-25 NOTE — Progress Notes (Signed)
  Echocardiogram Echocardiogram Transesophageal has been performed.  [name] 08/25/2018, 3:13 PM

## 2018-08-25 NOTE — Progress Notes (Signed)
Placed patient on CPAP via FFM, 8.0 cm H20 (previous settings) RA.

## 2018-08-25 NOTE — Anesthesia Procedure Notes (Signed)
Procedure Name: MAC Date/Time: 08/25/2018 2:33 PM Performed by: Teressa Lower., CRNA Pre-anesthesia Checklist: Patient identified, Emergency Drugs available, Suction available, Patient being monitored and Timeout performed Patient Re-evaluated:Patient Re-evaluated prior to induction Oxygen Delivery Method: Nasal cannula

## 2018-08-25 NOTE — Interval H&P Note (Signed)
History and Physical Interval Note:  08/25/2018 1:18 PM  [name]  has presented today for surgery, with the diagnosis of BACTEREMIA  The various methods of treatment have been discussed with the patient and family. After consideration of risks, benefits and other options for treatment, the patient has consented to  Procedure(s): TRANSESOPHAGEAL ECHOCARDIOGRAM (TEE) (N/A) as a surgical intervention .  The patient's history has been reviewed, patient examined, no change in status, stable for surgery.  I have reviewed the patient's chart and labs.  Questions were answered to the patient's satisfaction.     Ena Dawley

## 2018-08-25 NOTE — Transfer of Care (Signed)
Immediate Anesthesia Transfer of Care Note  Patient: [name]  Procedure(s) Performed: TRANSESOPHAGEAL ECHOCARDIOGRAM (TEE) (N/A )  Patient Location: Endoscopy Unit  Anesthesia Type:MAC  Level of Consciousness: awake, alert  and oriented  Airway & Oxygen Therapy: Patient Spontanous Breathing and Patient connected to nasal cannula oxygen  Post-op Assessment: Report given to RN and Post -op Vital signs reviewed and stable  Post vital signs: Reviewed and stable  Last Vitals:  Vitals Value Taken Time  BP 85/44 08/25/2018  2:51 PM  Temp 37.1 C 08/25/2018  2:51 PM  Pulse 89 08/25/2018  2:52 PM  Resp 24 08/25/2018  2:52 PM  SpO2 93 % 08/25/2018  2:52 PM  Vitals shown include unvalidated device data.  Last Pain:  Vitals:   08/25/18 1451  TempSrc: Oral  PainSc: 0-No pain         Complications: No apparent anesthesia complications

## 2018-08-26 ENCOUNTER — Encounter (HOSPITAL_COMMUNITY): Payer: Self-pay | Admitting: Cardiology

## 2018-08-26 ENCOUNTER — Other Ambulatory Visit: Payer: Self-pay | Admitting: *Deleted

## 2018-08-26 LAB — RENAL FUNCTION PANEL
ALBUMIN: 2.1 g/dL — AB (ref 3.5–5.0)
Anion gap: 13 (ref 5–15)
BUN: 52 mg/dL — AB (ref 8–23)
CALCIUM: 8.2 mg/dL — AB (ref 8.9–10.3)
CO2: 21 mmol/L — ABNORMAL LOW (ref 22–32)
CREATININE: 7.38 mg/dL — AB (ref 0.61–1.24)
Chloride: 102 mmol/L (ref 98–111)
GFR calc Af Amer: 8 mL/min — ABNORMAL LOW (ref >60)
GFR calc non Af Amer: 7 mL/min — ABNORMAL LOW (ref >60)
Glucose, Bld: 100 mg/dL — ABNORMAL HIGH (ref 70–99)
PHOSPHORUS: 7.5 mg/dL — AB (ref 2.5–4.6)
Potassium: 4.1 mmol/L (ref 3.5–5.1)
SODIUM: 136 mmol/L (ref 135–145)

## 2018-08-26 LAB — CBC WITH DIFFERENTIAL/PLATELET
Abs Immature Granulocytes: 0.37 10*3/uL — ABNORMAL HIGH (ref 0.00–0.07)
BASOS ABS: 0 10*3/uL (ref 0.0–0.1)
BASOS PCT: 1 %
Eosinophils Absolute: 0.3 10*3/uL (ref 0.0–0.5)
Eosinophils Relative: 3 %
HCT: 33.5 % — ABNORMAL LOW (ref 39.0–52.0)
HEMOGLOBIN: 10.5 g/dL — AB (ref 13.0–17.0)
Immature Granulocytes: 5 %
LYMPHS PCT: 22 %
Lymphs Abs: 1.7 10*3/uL (ref 0.7–4.0)
MCH: 29 pg (ref 26.0–34.0)
MCHC: 31.3 g/dL (ref 30.0–36.0)
MCV: 92.5 fL (ref 80.0–100.0)
Monocytes Absolute: 1.4 10*3/uL — ABNORMAL HIGH (ref 0.1–1.0)
Monocytes Relative: 18 %
NEUTROS PCT: 51 %
NRBC: 0 % (ref 0.0–0.2)
Neutro Abs: 4 10*3/uL (ref 1.7–7.7)
Platelets: 188 10*3/uL (ref 150–400)
RBC: 3.62 MIL/uL — AB (ref 4.22–5.81)
RDW: 17.5 % — ABNORMAL HIGH (ref 11.5–15.5)
WBC: 7.7 10*3/uL (ref 4.0–10.5)

## 2018-08-26 LAB — GLUCOSE, CAPILLARY
GLUCOSE-CAPILLARY: 87 mg/dL (ref 70–99)
GLUCOSE-CAPILLARY: 142 mg/dL — AB (ref 70–99)
Glucose-Capillary: 93 mg/dL (ref 70–99)
Glucose-Capillary: 79 mg/dL (ref 70–99)
Glucose-Capillary: 144 mg/dL — ABNORMAL HIGH (ref 70–99)

## 2018-08-26 LAB — PHOSPHORUS: Phosphorus: 7.5 mg/dL — ABNORMAL HIGH (ref 2.5–4.6)

## 2018-08-26 LAB — MAGNESIUM: Magnesium: 2.4 mg/dL (ref 1.7–2.4)

## 2018-08-26 MED ORDER — ZOLPIDEM TARTRATE 5 MG PO TABS
5.0000 mg | ORAL_TABLET | Freq: Every evening | ORAL | Status: DC | PRN
  Administered 2018-08-27 – 2018-08-30 (×3): 5 mg via ORAL
  Filled 2018-08-26 (×3): qty 1

## 2018-08-26 MED ORDER — GABAPENTIN 100 MG PO CAPS
200.0000 mg | ORAL_CAPSULE | Freq: Three times a day (TID) | ORAL | Status: DC
  Administered 2018-08-26 – 2018-08-31 (×15): 200 mg via ORAL
  Filled 2018-08-26 (×15): qty 2

## 2018-08-26 MED ORDER — MIDODRINE HCL 5 MG PO TABS
ORAL_TABLET | ORAL | Status: AC
  Administered 2018-08-26: 10 mg via ORAL
  Filled 2018-08-26: qty 2

## 2018-08-26 MED ORDER — CHLORHEXIDINE GLUCONATE CLOTH 2 % EX PADS: 6.0000 | MEDICATED_PAD | Freq: Every day | CUTANEOUS | Status: DC

## 2018-08-26 MED ORDER — GABAPENTIN 300 MG PO CAPS: 600.0000 mg | ORAL_CAPSULE | Freq: Every day | ORAL | Status: DC

## 2018-08-26 MED ORDER — CHLORHEXIDINE GLUCONATE CLOTH 2 % EX PADS
6.0000 | MEDICATED_PAD | Freq: Every day | CUTANEOUS | Status: DC
  Administered 2018-08-26: 6 via TOPICAL

## 2018-08-26 NOTE — Progress Notes (Signed)
NURSING PROGRESS NOTE  [name] [id number] Transfer Data: 08/26/2018 6:00 PM Attending Provider: Brand Males, MD HAF:BXUX, Hunt Oris, MD Code Status: full    Blood pressure (!) 154/72, pulse 84, temperature 98.1 F (36.7 C), temperature source Oral, resp. rate 16, height 6' (1.829 m), weight 99.1 kg, SpO2 93 %.   Allergies:  Cephalexin; Statins; and Ciprofloxacin  Past Medical History:   has a past medical history of Allergic rhinitis, cause unspecified (02/24/2014), Anemia (06/16/2011), BENIGN PROSTATIC HYPERTROPHY (10/14/2009), CAD, NATIVE VESSEL (02/06/2009), Cervical radiculopathy, chronic (02/23/2016), Chronic combined systolic (congestive) and diastolic (congestive) heart failure ([place]), COLONIC POLYPS, HX OF (10/14/2009), Dementia ([place]) ([id number]), Depression (09/24/2015), DIABETES MELLITUS, TYPE II (02/01/2010), DYSLIPIDEMIA (06/18/2007), ESRD (end stage renal disease) on dialysis ([place]) (08/04/2010), FOOT PAIN (08/12/2008), GAIT DISTURBANCE (03/03/2010), GASTROENTERITIS, VIRAL (10/14/2009), GERD (06/18/2007), GOITER, MULTINODULAR (12/26/2007), GOUT (06/18/2007), GYNECOMASTIA (07/17/2010), Hemodialysis access, fistula mature ([place]), Hyperlipidemia (10/16/2011), Hyperparathyroidism, secondary ([place]) (06/16/2011), HYPERTENSION (06/18/2007), Hyperthyroidism, Hypocalcemia (06/07/2010), Ischemic cardiomyopathy, Lumbar stenosis with neurogenic claudication, ONYCHOMYCOSIS, TOENAILS (12/26/2007), OSA on CPAP (10/16/2011), Other malaise and fatigue (11/24/2009), PERIPHERAL NEUROPATHY (06/18/2007), Prostate cancer ([place]), PSVT (paroxysmal supraventricular tachycardia) ([place]), PULMONARY NODULE, RIGHT LOWER LOBE (06/08/2009), Sleep apnea, TRANSAMINASES, SERUM, ELEVATED (02/01/2010), Transfusion history, and Unspecified hypotension (01/30/2010).  Past Surgical History:   has a past surgical history that includes Coronary angioplasty with stent (06/11/2008); Cervical spine surgery (2/09); Back surgery (1998);  Esophagogastroduodenoscopy (09/28/2011); Givens capsule study (09/30/2011); Cholecystectomy; Coronary angioplasty with stent (06/2007); Tonsillectomy; AV fistula placement (11/07/2011); Arteriovenous graft placement (Right, 2009); Insertion of dialysis catheter (Right, 2014); Insertion of dialysis catheter (Left, 02/11/2013); Removal of a dialysis catheter (Right, 02/11/2013); Bascilic vein transposition (Right, 02/27/2013); shuntogram (N/A, 09/20/2011); venogram (N/A, 01/26/2013); Esophagogastroduodenoscopy (N/A, 04/07/2015); Savory dilation (N/A, 04/07/2015); Foreign Body Removal (09/2003); Esophagogastroduodenoscopy (N/A, 04/19/2015); Total knee arthroplasty (Right, 08/02/2015); Cardiac catheterization (N/A, 08/06/2016); Cardiac catheterization (N/A, 08/07/2016); SVT ABLATION (N/A, 11/26/2016); Colonoscopy with propofol (N/A, 04/26/2017); Flexible sigmoidoscopy (N/A, 05/21/2017); Flexible sigmoidoscopy (Left, 07/02/2017); Lumbar laminectomy/decompression microdiscectomy (Bilateral, 07/31/2017); A/V SHUNTOGRAM (N/A, 04/07/2018); PERIPHERAL VASCULAR BALLOON ANGIOPLASTY (04/07/2018); and Esophagogastroduodenoscopy (egd) with propofol (N/A, 06/13/2018).  Social History:   reports that he quit smoking about 12 years ago. His smoking use included cigarettes. He has a 25.00 pack-year smoking history. He quit smokeless tobacco use about 12 years ago. He reports that he does not drink alcohol or use drugs.  Report called to [place] RN and transported to unit.

## 2018-08-26 NOTE — Progress Notes (Signed)
Patient arrived from 33M ICU to 71M-09. No tele ordered. C-diff +. IV SNL at this time. Patient oriented to room and alert and oriented x 4. Report taken from [place], [place]. Wife at bedside. Will continue to monitor.   Farley Ly RN

## 2018-08-26 NOTE — Progress Notes (Addendum)
  [facility] Progress Note   Subjective:   On HD now upstairs, accessed AVF w/o difficulty.  Pleasant affect today.   Physical Exam: GEN alert, slightly disoriented HEENT eyes closed, EOMI NECK no JVD PULM clear anteriorly, muffled bases CV tachycardic, II/VI systolic murmur ABD nontender, +BS EXT 1 + bilat UE edema  NEURO nonfocal, gen'd weakness ACCESS RUE AVF + T/B.  Mult failed accesses  Dialysis: SW TTS 4h  98.5kg  2/2.25 bath  P2   Hep none     Assessment/Plan: 1. Septic shock: klebsiella bacteremia, recurrent.  Unclear source, on IV Rocephin per ID 2. C diff: on po Vanc  3. AMS: prob ICU psychosis/ delirium. Resolving. Much better, still disoriented. 4. ESRD: SP CRRT 11/20-22, back on IHD.  Using AVF. HD today then Wed and Sat this week (holiday schedule).  5. Anemia of ESRD: no ESA as OP, Hb > 10, no need esa yet 6. MBD ckd: phos was low, coming up some, holding binders for now 7. Nutrition: renal diet when eating  Kelly Splinter MD [facility] pgr [phone]   08/26/2018, 11:12 AM      Objective:   BP (!) 187/82   Pulse 70   Temp 98.1 F (36.7 C) (Oral)   Resp 16   Ht 6' (1.829 m)   Wt 100.4 kg   SpO2 97%   BMI 30.02 kg/m    Labs: BMET Recent Labs  Lab 08/21/18 0359 08/21/18 1626 08/22/18 0443 08/23/18 0334 08/24/18 0615 08/25/18 0457 08/25/18 0548 08/26/18 0625 08/26/18 0828  NA 139 137 137 131* 129*  --  136  --  136  K 5.3* 4.2 4.0 4.5 4.7  --  3.9  --  4.1  CL 104 104 102 100 99  --  101  --  102  CO2 21* 21* 21* 21* 20*  --  25  --  21*  GLUCOSE 143* 93 55* 185* 226*  --  96  --  100*  BUN 47* 29* 23 30* 48*  --  36*  --  52*  CREATININE 8.46* 4.64* 3.54* 4.45* 5.65*  --  5.26*  --  7.38*  CALCIUM 7.9* 8.3* 9.0 8.9 8.9  --  8.4*  --  8.2*  PHOS 5.3* 2.4* 2.7 3.9 5.7* 2.7 4.9* 7.5* 7.5*   CBC Recent Labs  Lab 08/23/18 0334 08/24/18 0549 08/25/18 0548 08/26/18 0625  WBC 15.8* 9.2 10.1 7.7   NEUTROABS 11.2* 6.0 5.8 4.0  HGB 10.5* 10.9* 10.4* 10.5*  HCT 34.0* 36.2* 32.8* 33.5*  MCV 93.7 95.5 92.7 92.5  PLT 133* 133* 152 188    @IMGRELPRIORS @ Medications:    . allopurinol  100 mg Oral Daily  . aspirin  81 mg Oral Daily  . cinacalcet  90 mg Oral BID WC  . citalopram  10 mg Oral QHS  . colesevelam  1,875 mg Oral BID WC  . ezetimibe  10 mg Oral Daily  . famotidine  20 mg Oral Daily  . feeding supplement (NEPRO CARB STEADY)  237 mL Oral BID BM  . gabapentin  200 mg Oral QHS  . heparin injection (subcutaneous)  5,000 Units Subcutaneous Q8H  . insulin aspart  0-9 Units Subcutaneous TID WC  . methimazole  2.5 mg Oral Daily  . midodrine  10 mg Oral TID WC  . multivitamin  1 tablet Oral Daily  . vancomycin  125 mg Oral Q6H

## 2018-08-26 NOTE — Patient Outreach (Signed)
[facility]) Care Management  08/26/2018  VADA YELLEN 24-Mar-1951 [id number]   CSW was scheduled to make an initial outreach call to patient today to perform the phone assessment, as well as assess and assist with social work needs and services; however, CSW noted that patient continues to reside in the hospital.  Patient is receiving treatment for Hypotension and Septic Shock.  CSW will continue to follow patient while hospitalized, then provide social work services and resources once patient has been discharged back into the community. Nat Christen, BSW, MSW, LCSW  Licensed Education officer, [facility]  [address] [place] [address] [place], [place] [address] Physical Address-[address], [place], [place] [address] Toll Free Main # [phone] Fax # [phone] Cell # [phone]  Office # [phone] [email or web address]

## 2018-08-26 NOTE — Progress Notes (Signed)
INFECTIOUS DISEASE PROGRESS NOTE  ID: [name] is a 67 y.o. male with  Principal Problem:   Sepsis ([place]) Active Problems:   Ischemic cardiomyopathy   OSA (obstructive sleep apnea)   Hyperthyroidism   Acute encephalopathy   DM (diabetes mellitus) type II controlled with renal manifestation ([name])   PAD (peripheral artery disease) ([place])   Status post coronary artery stent placement   ESRD on hemodialysis ([place])   Encounter for hemodialysis ([place])   Chronic GI bleeding   Chronic combined systolic (congestive) and diastolic (congestive) heart failure ([name])   Altered mental status   Gram-negative bacteremia  Subjective: Feeling better today. Only 2 BMs over the last 24hours. Wife is nervous about where infection came from and specifically from back/neck hardware. No neck/back pain per patient.   Abtx:  Anti-infectives (From admission, onward)   Start     Dose/Rate Route Frequency Ordered Stop   08/22/18 2000  cefTRIAXone (ROCEPHIN) 2 g in sodium chloride 0.9 % 100 mL IVPB     2 g 200 mL/hr over 30 Minutes Intravenous Every 24 hours 08/22/18 0924 09/04/18 2359   08/22/18 1500  vancomycin (VANCOCIN) 50 mg/mL oral solution 125 mg     125 mg Oral Every 6 hours 08/22/18 0934 09/14/18 2359   08/22/18 1000  vancomycin (VANCOCIN) 50 mg/mL oral solution 500 mg  Status:  Discontinued     500 mg Oral 4 times daily 08/22/18 0839 08/22/18 0932   08/21/18 1800  vancomycin (VANCOCIN) 50 mg/mL oral solution 125 mg  Status:  Discontinued     125 mg Oral 4 times daily 08/21/18 1652 08/22/18 0839   08/21/18 1200  vancomycin (VANCOCIN) IVPB 1000 mg/200 mL premix  Status:  Discontinued     1,000 mg 200 mL/hr over 60 Minutes Intravenous Every T-Th-Sa (Hemodialysis) 08/19/18 1737 08/20/18 0728   08/21/18 0800  meropenem (MERREM) 1 g in sodium chloride 0.9 % 100 mL IVPB  Status:  Discontinued     1 g 200 mL/hr over 30 Minutes Intravenous Every 12 hours 08/21/18 0224 08/22/18 0923   08/20/18 1800   ceFEPIme (MAXIPIME) 1 g in sodium chloride 0.9 % 100 mL IVPB  Status:  Discontinued     1 g 200 mL/hr over 30 Minutes Intravenous Every 24 hours 08/19/18 1737 08/20/18 1012   08/20/18 1700  cefTRIAXone (ROCEPHIN) 2 g in sodium chloride 0.9 % 100 mL IVPB  Status:  Discontinued     2 g 200 mL/hr over 30 Minutes Intravenous Every 24 hours 08/20/18 1012 08/20/18 1117   08/20/18 1200  meropenem (MERREM) 500 mg in sodium chloride 0.9 % 100 mL IVPB  Status:  Discontinued     500 mg 200 mL/hr over 30 Minutes Intravenous Every 24 hours 08/20/18 1117 08/21/18 0218   08/19/18 1800  metroNIDAZOLE (FLAGYL) IVPB 500 mg  Status:  Discontinued     500 mg 100 mL/hr over 60 Minutes Intravenous Every 8 hours 08/19/18 1709 08/20/18 0728   08/19/18 1730  vancomycin (VANCOCIN) 2,000 mg in sodium chloride 0.9 % 500 mL IVPB     2,000 mg 250 mL/hr over 120 Minutes Intravenous  Once 08/19/18 1709 08/19/18 2045   08/19/18 1715  ceFEPIme (MAXIPIME) 2 g in sodium chloride 0.9 % 100 mL IVPB     2 g 200 mL/hr over 30 Minutes Intravenous  Once 08/19/18 1709 08/19/18 1802      Medications:  Scheduled: . allopurinol  100 mg Oral Daily  . aspirin  81 mg Oral Daily  . Chlorhexidine Gluconate Cloth  6 each Topical Q0600  . [START ON 08/27/2018] Chlorhexidine Gluconate Cloth  6 each Topical Q0600  . cinacalcet  90 mg Oral BID WC  . citalopram  10 mg Oral QHS  . colesevelam  1,875 mg Oral BID WC  . ezetimibe  10 mg Oral Daily  . famotidine  20 mg Oral Daily  . feeding supplement (NEPRO CARB STEADY)  237 mL Oral BID BM  . gabapentin  200 mg Oral TID  . heparin injection (subcutaneous)  5,000 Units Subcutaneous Q8H  . insulin aspart  0-9 Units Subcutaneous TID WC  . methimazole  2.5 mg Oral Daily  . multivitamin  1 tablet Oral Daily  . vancomycin  125 mg Oral Q6H    Objective: Vital signs in last 24 hours: Temp:  [97.8 F (36.6 C)-98.3 F (36.8 C)] 98.1 F (36.7 C) (11/26 1600) Pulse Rate:  [70-97] 84 (11/26  1600) Resp:  [14-20] 16 (11/26 1600) BP: (79-196)/(54-130) 154/72 (11/26 1600) SpO2:  [91 %-100 %] 93 % (11/26 1600) Weight:  [99.1 kg-104 kg] 99.1 kg (11/26 1145)   General appearance: alert and no distress Neck: no adenopathy, no carotid bruit, no JVD, supple, symmetrical, trachea midline and thyroid not enlarged, symmetric, no tenderness/mass/nodules Resp: clear to auscultation bilaterally Cardio: regular rate and rhythm and systolic murmur 4-2/6 GI: soft, non-tender; bowel sounds normal; no masses,  no organomegaly Extremities: extremities normal, atraumatic, no cyanosis or edema Skin: Skin color, texture, turgor normal. No rashes or lesions  Lab Results Recent Labs    08/25/18 0548 08/26/18 0625 08/26/18 0828  WBC 10.1 7.7  --   HGB 10.4* 10.5*  --   HCT 32.8* 33.5*  --   NA 136  --  136  K 3.9  --  4.1  CL 101  --  102  CO2 25  --  21*  BUN 36*  --  52*  CREATININE 5.26*  --  7.38*   Liver Panel Recent Labs    08/24/18 0615 08/26/18 0828  ALBUMIN 2.0* 2.1*   Sedimentation Rate No results for input(s): ESRSEDRATE in the last 72 hours. C-Reactive Protein No results for input(s): CRP in the last 72 hours.  Microbiology: Recent Results (from the past 240 hour(s))  Culture, blood (routine x 2)     Status: Abnormal   Collection Time: 08/19/18  5:07 PM  Result Value Ref Range Status   Specimen Description BLOOD RIGHT ANTECUBITAL  Final   Special Requests   Final    BOTTLES DRAWN AEROBIC AND ANAEROBIC Blood Culture adequate volume   Culture  Setup Time   Final    GRAM NEGATIVE RODS IN BOTH AEROBIC AND ANAEROBIC BOTTLES CRITICAL RESULT CALLED TO, READ BACK BY AND VERIFIED WITHLenna Sciara [facility] PHARMD [id number] 08/20/18 A BROWNING    Culture (A)  Final    KLEBSIELLA PNEUMONIAE SUSCEPTIBILITIES PERFORMED ON PREVIOUS CULTURE WITHIN THE LAST 5 DAYS. Performed at [facility], [place] [address], [name], [place] [address]    Report Status 08/22/2018 FINAL  Final    Culture, blood (routine x 2)     Status: Abnormal   Collection Time: 08/19/18  5:23 PM  Result Value Ref Range Status   Specimen Description BLOOD RIGHT ARM  Final   Special Requests   Final    BOTTLES DRAWN AEROBIC AND ANAEROBIC Blood Culture results may not be optimal due to an inadequate volume of blood received in culture  bottles   Culture  Setup Time   Final    GRAM NEGATIVE RODS IN BOTH AEROBIC AND ANAEROBIC BOTTLES CRITICAL RESULT CALLED TO, READ BACK BY AND VERIFIED WITHKarsten Ro [facility] 8127 08/20/18 A BROWNING Performed at [facility], [place] [address], [place], [place] [address]    Culture KLEBSIELLA PNEUMONIAE (A)  Final   Report Status 08/22/2018 FINAL  Final   Organism ID, Bacteria KLEBSIELLA PNEUMONIAE  Final      Susceptibility   Klebsiella pneumoniae - MIC*    AMPICILLIN >=32 RESISTANT Resistant     CEFAZOLIN <=4 SENSITIVE Sensitive     CEFEPIME <=1 SENSITIVE Sensitive     CEFTAZIDIME <=1 SENSITIVE Sensitive     CEFTRIAXONE <=1 SENSITIVE Sensitive     CIPROFLOXACIN <=0.25 SENSITIVE Sensitive     GENTAMICIN <=1 SENSITIVE Sensitive     IMIPENEM <=0.25 SENSITIVE Sensitive     TRIMETH/SULFA <=20 SENSITIVE Sensitive     AMPICILLIN/SULBACTAM 16 INTERMEDIATE Intermediate     PIP/TAZO 16 SENSITIVE Sensitive     Extended ESBL NEGATIVE Sensitive     * KLEBSIELLA PNEUMONIAE  Blood Culture ID Panel (Reflexed)     Status: Abnormal   Collection Time: 08/19/18  5:23 PM  Result Value Ref Range Status   Enterococcus species NOT DETECTED NOT DETECTED Final   Listeria monocytogenes NOT DETECTED NOT DETECTED Final   Staphylococcus species NOT DETECTED NOT DETECTED Final   Staphylococcus aureus (BCID) NOT DETECTED NOT DETECTED Final   Streptococcus species NOT DETECTED NOT DETECTED Final   Streptococcus agalactiae NOT DETECTED NOT DETECTED Final   Streptococcus pneumoniae NOT DETECTED NOT DETECTED Final   Streptococcus pyogenes NOT DETECTED NOT DETECTED Final    Acinetobacter baumannii NOT DETECTED NOT DETECTED Final   Enterobacteriaceae species DETECTED (A) NOT DETECTED Final    Comment: Enterobacteriaceae represent a large family of gram-negative bacteria, not a single organism. CRITICAL RESULT CALLED TO, READ BACK BY AND VERIFIED WITH: [facility] PHARMD [id number] 08/20/18 A BROWNING    Enterobacter cloacae complex NOT DETECTED NOT DETECTED Final   Escherichia coli NOT DETECTED NOT DETECTED Final   Klebsiella oxytoca NOT DETECTED NOT DETECTED Final   Klebsiella pneumoniae DETECTED (A) NOT DETECTED Final    Comment: CRITICAL RESULT CALLED TO, READ BACK BY AND VERIFIED WITH: Karsten Ro PHARMD [id number] 08/20/18 A BROWNING    Proteus species NOT DETECTED NOT DETECTED Final   Serratia marcescens NOT DETECTED NOT DETECTED Final   Carbapenem resistance NOT DETECTED NOT DETECTED Final   Haemophilus influenzae NOT DETECTED NOT DETECTED Final   Neisseria meningitidis NOT DETECTED NOT DETECTED Final   Pseudomonas aeruginosa NOT DETECTED NOT DETECTED Final   Candida albicans NOT DETECTED NOT DETECTED Final   Candida glabrata NOT DETECTED NOT DETECTED Final   Candida krusei NOT DETECTED NOT DETECTED Final   Candida parapsilosis NOT DETECTED NOT DETECTED Final   Candida tropicalis NOT DETECTED NOT DETECTED Final    Comment: Performed at [facility], [place] [address], [place], [place] [address]  MRSA PCR Screening     Status: None   Collection Time: 08/20/18 12:02 PM  Result Value Ref Range Status   MRSA by PCR NEGATIVE NEGATIVE Final    Comment:        The GeneXpert MRSA Assay (FDA approved for NASAL specimens only), is one component of a comprehensive MRSA colonization surveillance program. It is not intended to diagnose MRSA infection nor to guide or monitor treatment for MRSA infections. Performed at  [facility], [place] [address], [place], [place] [address]   C difficile quick scan w PCR reflex     Status: Abnormal   Collection Time: 08/21/18  11:24 AM  Result Value Ref Range Status   C Diff antigen POSITIVE (A) NEGATIVE Final   C Diff toxin NEGATIVE NEGATIVE Final   C Diff interpretation Results are indeterminate. See PCR results.  Final    Comment: Performed at [facility], [place] [address], [place], [place] [address]  C. Diff by PCR, Reflexed     Status: Abnormal   Collection Time: 08/21/18 11:24 AM  Result Value Ref Range Status   Toxigenic C. Difficile by PCR POSITIVE (A) NEGATIVE Final    Comment: Positive for toxigenic C. difficile with little to no toxin production. Only treat if clinical presentation suggests symptomatic illness. Performed at [facility], [place] [address] [place], [place] [address]     Studies/Results: No results found.   Assessment/Plan: 1. Klebsiella bacteremia (relapsed) = Leukocytosis resolved, we have not repeated BCx but he defervesced 6d ago and has since normalized WBCs. TEE negative for IE. He had recent endoscopy/GI procedure ~3w prior to initial bacteremia in September 2019 in the setting of GI bleed; known h/o AVMs and UGI/LGI bleeding in the past. ?Presumed GI source for ongoing bacteremia. Will see him in clinic and obtain clearance blood cultures at that time. He will continue IV Ceftriaxone while inpatient and transition to Ceftazidime to complete 14 days of treatment through 09/04/18.   2. C diff = improving with treatment and having intermittent/expected BM #s now. Plan to continue with QID vancomycin 125 mg PO throughout abx treatment and beyond x 10d. Will need through 09/14/18 - will assess outpatient for consideration of tapering depending on sx at follow up appt.   3. ESRD = we have looked at his graft site under u/s and no findings concerning for graft abscess/infection.   4. Delrium = resolved and back to baseline.   5. Lumbar hardware (L2-3, L5-S1) = he has not had any worsening pain at any hardware site or throughout back; less likely seeded infection w/o symptoms.  Will discuss MRI need with Dr. Baxter Flattery.          Janene Madeira, MSN, NP-C [facility] for Infectious Disease [email or web address] Pager: [phone] Office: [phone]

## 2018-08-26 NOTE — Progress Notes (Signed)
NAME:  [name], MRN:  [id number], DOB:  May 14, 1951, LOS: 7 ADMISSION DATE:  08/19/2018, CONSULTATION DATE: 08/20/2018 REFERRING MD: [facility], CHIEF COMPLAINT: Hypotension  Brief History   67 yo male was at outpt HD and developed abdominal pain with fever 104F.  Found to have bacteremia.  He developed hypotension while in hospital and PCCM consulted.  Past Medical History  ESRD, ischemia cardiomyopathy, DM, HTN, Onychogyphosis  Significant Hospital Events   11/19 admission 11/20  after returning from CT scan was found to be hypotensive transferred to ICU 11/22 off vasopressor support; agitated, hypoglycemic event   11/23 needed precedex  Consults:  08/20/2018 PCCM 08/19/2018 renal 08/21/2018 infectious disease  Procedures:  11/21 right femoral HD catheter>>  Significant Diagnostic Tests:  11/20  CT of the abdomen>> no acute process is identified  Micro Data:  08/19/2018 blood cultures x2>> 1 with Klebsiella 1 with gram-negative rods>> 08/21/18  C Diff - Ag +ve, PCR +. Toxin - negative . No colitis on CT  Antimicrobials:  08/20/2018 meropenem 08/21/2018 08/19/2018 vancomycin 21 2019 08/21/2018 Rocephin IV (ID recommends 2 week totalRx) 08/21/2018 vancomycin ORAL  Interim history/subjective:  C/o neuropathic pain.  Objective   Blood pressure (!) 187/82, pulse 70, temperature 98.1 F (36.7 C), temperature source Oral, resp. rate 16, height 6' (1.829 m), weight 100.4 kg, SpO2 97 %.        Intake/Output Summary (Last 24 hours) at 08/26/2018 1311 Last data filed at 08/25/2018 2200 Gross per 24 hour  Intake 407 ml  Output -  Net 407 ml   Filed Weights   08/25/18 0500 08/26/18 0500 08/26/18 0756  Weight: 100.3 kg 104 kg 100.4 kg   General - alert Eyes - pupils reactive ENT - no sinus tenderness, no stridor Cardiac - regular rate/rhythm, no murmur Chest - equal breath sounds b/l, no wheezing or rales Abdomen - soft, non tender, + bowel sounds Extremities -  no edema Skin - no rashes Neuro - CN intact, normal strength, moves extremities, follows commands Psych - normal mood and behavior   Assessment & Plan:   Septic shock from Klebsiella bacteremia and C diff colitis. Plan - Abx per ID - no vegetation on TEE from 11/25  ESRD. Plan - HD per renal  DM with peripheral neuropathy. Plan - SSI - resume gabapentin, celexa  Hx of hyperthyroidism. Plan - methimazole  Hx of gout. Plan - continue allopurinol  Obstructive sleep apnea. Hx of insomnia. Plan - ambien - CPAP qhs  Best practice:  Diet: Low carbohydrate diet DVT prophylaxis: Subcu heparin GI prophylaxis: PPI change to H2 blockade due to C diff Mobility: OOB Code Status: Full code Family Communication: no family at bedside   Labs:   CMP Latest Ref Rng & Units 08/26/2018 08/25/2018 08/24/2018  Glucose 70 - 99 mg/dL 100(H) 96 226(H)  BUN 8 - 23 mg/dL 52(H) 36(H) 48(H)  Creatinine 0.61 - 1.24 mg/dL 7.38(H) 5.26(H) 5.65(H)  Sodium 135 - 145 mmol/L 136 136 129(L)  Potassium 3.5 - 5.1 mmol/L 4.1 3.9 4.7  Chloride 98 - 111 mmol/L 102 101 99  CO2 22 - 32 mmol/L 21(L) 25 20(L)  Calcium 8.9 - 10.3 mg/dL 8.2(L) 8.4(L) 8.9  Total Protein 6.5 - 8.1 g/dL - - -  Total Bilirubin 0.3 - 1.2 mg/dL - - -  Alkaline Phos 38 - 126 U/L - - -  AST 15 - 41 U/L - - -  ALT 0 - 44 U/L - - -   CBC  Latest Ref Rng & Units 08/26/2018 08/25/2018 08/24/2018  WBC 4.0 - 10.5 K/uL 7.7 10.1 9.2  Hemoglobin 13.0 - 17.0 g/dL 10.5(L) 10.4(L) 10.9(L)  Hematocrit 39.0 - 52.0 % 33.5(L) 32.8(L) 36.2(L)  Platelets 150 - 400 K/uL 188 152 133(L)   ABG    Component Value Date/Time   PHART 7.410 08/20/2018 1113   PCO2ART 32.7 08/20/2018 1113   PO2ART 82.3 (L) 08/20/2018 1113   HCO3 19.7 (L) 08/20/2018 1113   TCO2 28 08/19/2018 1716   ACIDBASEDEF 3.7 (H) 08/20/2018 1113   O2SAT 94.3 08/20/2018 1113   CBG (last 3)  Recent Labs    08/26/18 0400 08/26/18 0824 08/26/18 1252  GLUCAP 93 87 79     Transfer to floor bed 11/26 >> to [facility] 11/27 and PCCM off  Chesley Mires, MD [place] 08/26/2018, 1:33 PM

## 2018-08-27 ENCOUNTER — Inpatient Hospital Stay (HOSPITAL_COMMUNITY): Payer: Medicare Other

## 2018-08-27 ENCOUNTER — Other Ambulatory Visit: Payer: Self-pay | Admitting: Pharmacy Technician

## 2018-08-27 ENCOUNTER — Encounter: Payer: Self-pay | Admitting: Pharmacy Technician

## 2018-08-27 LAB — CBC WITH DIFFERENTIAL/PLATELET
Abs Immature Granulocytes: 0.23 10*3/uL — ABNORMAL HIGH (ref 0.00–0.07)
BASOS PCT: 1 %
Basophils Absolute: 0 10*3/uL (ref 0.0–0.1)
EOS ABS: 0.3 10*3/uL (ref 0.0–0.5)
EOS PCT: 4 %
HCT: 33.1 % — ABNORMAL LOW (ref 39.0–52.0)
Hemoglobin: 10.4 g/dL — ABNORMAL LOW (ref 13.0–17.0)
IMMATURE GRANULOCYTES: 4 %
Lymphocytes Relative: 21 %
Lymphs Abs: 1.2 10*3/uL (ref 0.7–4.0)
MCH: 29.2 pg (ref 26.0–34.0)
MCHC: 31.4 g/dL (ref 30.0–36.0)
MCV: 93 fL (ref 80.0–100.0)
MONOS PCT: 19 %
Monocytes Absolute: 1.1 10*3/uL — ABNORMAL HIGH (ref 0.1–1.0)
NEUTROS PCT: 51 %
Neutro Abs: 2.9 10*3/uL (ref 1.7–7.7)
PLATELETS: 203 10*3/uL (ref 150–400)
RBC: 3.56 MIL/uL — ABNORMAL LOW (ref 4.22–5.81)
RDW: 17.6 % — AB (ref 11.5–15.5)
WBC: 5.8 10*3/uL (ref 4.0–10.5)
nRBC: 0 % (ref 0.0–0.2)

## 2018-08-27 LAB — RENAL FUNCTION PANEL
Albumin: 2.2 g/dL — ABNORMAL LOW (ref 3.5–5.0)
Anion gap: 12 (ref 5–15)
BUN: 26 mg/dL — AB (ref 8–23)
CHLORIDE: 101 mmol/L (ref 98–111)
CO2: 24 mmol/L (ref 22–32)
CREATININE: 5.42 mg/dL — AB (ref 0.61–1.24)
Calcium: 8.3 mg/dL — ABNORMAL LOW (ref 8.9–10.3)
GFR calc Af Amer: 11 mL/min — ABNORMAL LOW (ref >60)
GFR calc non Af Amer: 10 mL/min — ABNORMAL LOW (ref >60)
GLUCOSE: 112 mg/dL — AB (ref 70–99)
Phosphorus: 5.7 mg/dL — ABNORMAL HIGH (ref 2.5–4.6)
Potassium: 3.7 mmol/L (ref 3.5–5.1)
Sodium: 137 mmol/L (ref 135–145)

## 2018-08-27 LAB — GLUCOSE, CAPILLARY
GLUCOSE-CAPILLARY: 76 mg/dL (ref 70–99)
GLUCOSE-CAPILLARY: 185 mg/dL — AB (ref 70–99)
Glucose-Capillary: 125 mg/dL — ABNORMAL HIGH (ref 70–99)

## 2018-08-27 LAB — MAGNESIUM: MAGNESIUM: 2.2 mg/dL (ref 1.7–2.4)

## 2018-08-27 MED ORDER — FERRIC CITRATE 1 GM 210 MG(FE) PO TABS
420.0000 mg | ORAL_TABLET | Freq: Three times a day (TID) | ORAL | Status: DC
  Administered 2018-08-27 – 2018-08-31 (×11): 420 mg via ORAL
  Filled 2018-08-27 (×14): qty 2

## 2018-08-27 MED ORDER — COLCHICINE 0.6 MG PO TABS
0.6000 mg | ORAL_TABLET | Freq: Once | ORAL | Status: AC
  Administered 2018-08-27: 0.6 mg via ORAL
  Filled 2018-08-27: qty 1

## 2018-08-27 MED ORDER — DOXERCALCIFEROL 4 MCG/2ML IV SOLN
4.0000 ug | INTRAVENOUS | Status: DC
  Administered 2018-08-30: 4 ug via INTRAVENOUS
  Filled 2018-08-27 (×2): qty 2

## 2018-08-27 NOTE — Progress Notes (Addendum)
[name] Progress Note   Dialysis Orders: SW TTS 4h  98.5kg  2/2.25 bath  P2  [name] 4 no ESA/Fe  Hep none    Assessment/Plan: 1. Klebsiella sepsis - recurrent bacteremia - source unclear - on IV Rocephin -ID rec 2 weeks of Fortaz counting 11/21 as day 1 TEE neg for endocarditis. 2. ESRD - TTS K 3.7 - 4 K bath today , required short term CRRT 11/20 - 11/22 3. Anemia - hgb 10.4 - normally in the 12s - if < 10 start ESA 4. Secondary hyperparathyroidism - resume [name] 4 - on sensipar but no binder P 5.7 - resumed Aurixya 5. HTN/volume - bed weights above EDW  6. Nutrition - renal carb mod diet - has renavites and nepro supplements- said he will drink 7. Left wrist pain - will discuss with Dr. Jonnie Finner - ? Possible xray 8. C diff - oral Vanc through 12/15 - contact precautions after d/c   Myriam Jacobson, PA-C [place] [phone] 08/27/2018,9:07 AM  LOS: 8 days    Pt seen, examined and agree w A/P as above. MS much better, pt cooperative now. Has new L wrist pain, have d/w pmd.  Will get plain films.  Tender on exam, mild effusion. HD today.  Kelly Splinter MD [employer] pager [phone]   08/27/2018, 11:09 AM     Subjective:   C/o left wrist tenderness - has had about a week - hurts on dorsal side especially when moving wrist laterally - flexing ok - mild puffiness - no erythema. Doesn't think he had an IV in that location. Not eating much. Objective Vitals:   08/27/18 0736 08/27/18 0743 08/27/18 0800 08/27/18 0830  BP: 136/72 (!) 143/63 (!) 119/57 139/90  Pulse: 78 78 74 (!) 104  Resp: 18     Temp: 98.4 F (36.9 C)     TempSrc: Oral     SpO2: 94%     Weight: 101.3 kg     Height:       Physical Exam goal 2.5 L General: supine on HD no SOB Heart: RRR 2/6 murmur Lungs: grossly clear Abdomen: obese soft NT Extremities: no sig LE edema, left wrist/hand mildly puffy more so than right Dialysis Access: right  upper AVF Qb 325   Additional Objective Labs: Basic Metabolic Panel: Recent Labs  Lab 08/25/18 0548 08/26/18 0625 08/26/18 0828 08/27/18 0529  NA 136  --  136 137  K 3.9  --  4.1 3.7  CL 101  --  102 101  CO2 25  --  21* 24  GLUCOSE 96  --  100* 112*  BUN 36*  --  52* 26*  CREATININE 5.26*  --  7.38* 5.42*  CALCIUM 8.4*  --  8.2* 8.3*  PHOS 4.9* 7.5* 7.5* 5.7*   Liver Function Tests: Recent Labs  Lab 08/24/18 0615 08/26/18 0828 08/27/18 0529  ALBUMIN 2.0* 2.1* 2.2*   No results for input(s): LIPASE, AMYLASE in the last 168 hours. CBC: Recent Labs  Lab 08/23/18 0334 08/24/18 0549 08/25/18 0548 08/26/18 0625 08/27/18 0529  WBC 15.8* 9.2 10.1 7.7 5.8  NEUTROABS 11.2* 6.0 5.8 4.0 2.9  HGB 10.5* 10.9* 10.4* 10.5* 10.4*  HCT 34.0* 36.2* 32.8* 33.5* 33.1*  MCV 93.7 95.5 92.7 92.5 93.0  PLT 133* 133* 152 188 203   Blood Culture    Component Value Date/Time   SDES BLOOD RIGHT ARM 08/19/2018 1723   SPECREQUEST  08/19/2018 1723  BOTTLES DRAWN AEROBIC AND ANAEROBIC Blood Culture results may not be optimal due to an inadequate volume of blood received in culture bottles   CULT KLEBSIELLA PNEUMONIAE (A) 08/19/2018 1723   REPTSTATUS 08/22/2018 FINAL 08/19/2018 1723    Cardiac Enzymes: No results for input(s): CKTOTAL, CKMB, CKMBINDEX, TROPONINI in the last 168 hours. CBG: Recent Labs  Lab 08/26/18 0400 08/26/18 0824 08/26/18 1252 08/26/18 1619 08/26/18 2203  GLUCAP 93 87 79 144* 142*   Iron Studies: No results for input(s): IRON, TIBC, TRANSFERRIN, FERRITIN in the last 72 hours. Lab Results  Component Value Date   INR 1.15 08/19/2018   INR 1.12 07/16/2018   INR 1.09 07/16/2018   Studies/Results: No results found. Medications: . cefTRIAXone (ROCEPHIN)  IV 2 g (08/26/18 2100)  . dextrose Stopped (08/25/18 0845)   . allopurinol  100 mg Oral Daily  . aspirin  81 mg Oral Daily  . Chlorhexidine Gluconate Cloth  6 each Topical Q0600  . Chlorhexidine  Gluconate Cloth  6 each Topical Q0600  . cinacalcet  90 mg Oral BID WC  . citalopram  10 mg Oral QHS  . colesevelam  1,875 mg Oral BID WC  . ezetimibe  10 mg Oral Daily  . famotidine  20 mg Oral Daily  . feeding supplement (NEPRO CARB STEADY)  237 mL Oral BID BM  . gabapentin  200 mg Oral TID  . heparin injection (subcutaneous)  5,000 Units Subcutaneous Q8H  . insulin aspart  0-9 Units Subcutaneous TID WC  . methimazole  2.5 mg Oral Daily  . multivitamin  1 tablet Oral Daily  . vancomycin  125 mg Oral Q6H

## 2018-08-27 NOTE — Patient Outreach (Signed)
[facility]) Care Management  08/27/2018  [name] [birth year] [id number]   Successful call to patient wife, [name], HIPAA identifiers verified. [name] is agreeable to restarting the patient assistance process for Mr. Greens Zetia. Prepared patient portion of application to be mailed and provider portion to be mailed to Dr. Sung Amabile.  Will follow up with patients wife in 5-7 business days to confirm application has been received.  Maud Deed Chana Bode Alexander Certified Pharmacy Technician [place] Management Direct Dial:[phone]

## 2018-08-27 NOTE — Progress Notes (Signed)
PROGRESS NOTE  [name] VQM:[id number] DOB: 09-29-1951 DOA: 08/19/2018 PCP: Biagio Borg, MD  HPI/Recap of past 24 hours: 67 yo male was at outpt HD and developed abdominal pain with fever 104F.  Found to have recurrent Klebsiella bacteremia.  He developed hypotension while in hospital.  08/27/18: He is alert and interactive in the HD center. Reports left wrist swelling. Xray done with suspicion for gout. One dose colchicine given 0.6 once.  Assessment/Plan: Principal Problem:   Sepsis ([place]) Active Problems:   Ischemic cardiomyopathy   OSA (obstructive sleep apnea)   Hyperthyroidism   Acute encephalopathy   DM (diabetes mellitus) type II controlled with renal manifestation ([name])   PAD (peripheral artery disease) ([place])   Status post coronary artery stent placement   ESRD on hemodialysis ([place])   Encounter for hemodialysis ([place])   Chronic GI bleeding   Chronic combined systolic (congestive) and diastolic (congestive) heart failure ([name])   Altered mental status   Gram-negative bacteremia   Septic shock 2/2 Recurrent klebsiella bacteremia Sepsis physiology resolving Positive blood cx from 08/19/18 Repeat blood cultures tomorrow ID following and recommends 2 weeks of IV ceftaz post HD. 08/21/18 as day 1/14  C-diff infection C/w po vancomycin 125 mg qid for additional 10 days after IV abx has need per ID Follow up with ID oupt  ESRD on HD HD today Nephrology following  OSA cpap qhs  Hx of gout with suspected gout flare to left wrist 1 dose 0.6 mg colchicine given once  DM with peripheral neuropathy. Plan - SSI - resume gabapentin, celexa  Hx of hyperthyroidism. Plan - methimazole   Diet: Low carbohydrate diet DVT prophylaxis: Subcu heparin GI prophylaxis: PPI change to H2 blockade due to C diff Mobility: OOB Code Status: Full code Family Communication: no family at bedside   Objective: Vitals:   08/27/18 1100 08/27/18 1130 08/27/18 1150 08/27/18  1237  BP: (!) 120/56 (!) 96/57 125/64 113/64  Pulse: 81 80 84 85  Resp:   18 18  Temp:   98.6 F (37 C) 98.7 F (37.1 C)  TempSrc:   Oral Oral  SpO2:   96% 100%  Weight:   99.5 kg   Height:        Intake/Output Summary (Last 24 hours) at 08/27/2018 1642 Last data filed at 08/27/2018 1150 Gross per 24 hour  Intake 120 ml  Output 2000 ml  Net -1880 ml   Filed Weights   08/26/18 1145 08/27/18 0736 08/27/18 1150  Weight: 99.1 kg 101.3 kg 99.5 kg    Exam:  . General: 67 y.o. year-old male well developed well nourished in no acute distress.  Alert and oriented x3. . Cardiovascular: Regular rate and rhythm with no rubs or gallops.  No thyromegaly or JVD noted.   [name] Respiratory: Clear to auscultation with no wheezes or rales. Good inspiratory effort. . Abdomen: Soft nontender nondistended with normal bowel sounds x4 quadrants. . Musculoskeletal: left wrist edema and tender. 2/4 pulses in all 4 extremities. [name] Psychiatry: Mood is appropriate for condition and setting   Data Reviewed: CBC: Recent Labs  Lab 08/23/18 0334 08/24/18 0549 08/25/18 0548 08/26/18 0625 08/27/18 0529  WBC 15.8* 9.2 10.1 7.7 5.8  NEUTROABS 11.2* 6.0 5.8 4.0 2.9  HGB 10.5* 10.9* 10.4* 10.5* 10.4*  HCT 34.0* 36.2* 32.8* 33.5* 33.1*  MCV 93.7 95.5 92.7 92.5 93.0  PLT 133* 133* 152 188 932   Basic Metabolic Panel: Recent Labs  Lab 08/23/18 0334 08/24/18 0615  08/25/18 0457 08/25/18 0548 08/26/18 0625 08/26/18 0828 08/27/18 0529  NA 131* 129*  --  136  --  136 137  K 4.5 4.7  --  3.9  --  4.1 3.7  CL 100 99  --  101  --  102 101  CO2 21* 20*  --  25  --  21* 24  GLUCOSE 185* 226*  --  96  --  100* 112*  BUN 30* 48*  --  36*  --  52* 26*  CREATININE 4.45* 5.65*  --  5.26*  --  7.38* 5.42*  CALCIUM 8.9 8.9  --  8.4*  --  8.2* 8.3*  MG 2.4 2.5* 1.3* 2.3 2.4  --  2.2  PHOS 3.9 5.7* 2.7 4.9* 7.5* 7.5* 5.7*   GFR: Estimated Creatinine Clearance: 15.7 mL/min (A) (by C-G formula based on SCr of  5.42 mg/dL (H)). Liver Function Tests: Recent Labs  Lab 08/21/18 0359 08/21/18 1626 08/24/18 0615 08/26/18 0828 08/27/18 0529  ALBUMIN 2.4* 2.3* 2.0* 2.1* 2.2*   No results for input(s): LIPASE, AMYLASE in the last 168 hours. No results for input(s): AMMONIA in the last 168 hours. Coagulation Profile: No results for input(s): INR, PROTIME in the last 168 hours. Cardiac Enzymes: No results for input(s): CKTOTAL, CKMB, CKMBINDEX, TROPONINI in the last 168 hours. BNP (last 3 results) No results for input(s): PROBNP in the last 8760 hours. HbA1C: No results for input(s): HGBA1C in the last 72 hours. CBG: Recent Labs  Lab 08/26/18 0824 08/26/18 1252 08/26/18 1619 08/26/18 2203 08/27/18 1236  GLUCAP 87 79 144* 142* 76   Lipid Profile: No results for input(s): CHOL, HDL, LDLCALC, TRIG, CHOLHDL, LDLDIRECT in the last 72 hours. Thyroid Function Tests: No results for input(s): TSH, T4TOTAL, FREET4, T3FREE, THYROIDAB in the last 72 hours. Anemia Panel: No results for input(s): VITAMINB12, FOLATE, FERRITIN, TIBC, IRON, RETICCTPCT in the last 72 hours. Urine analysis:    Component Value Date/Time   COLORURINE LT. YELLOW 06/26/2013 1200   APPEARANCEUR CLEAR 06/26/2013 1200   LABSPEC 1.020 06/26/2013 1200   PHURINE 8.5 06/26/2013 1200   GLUCOSEU 100 06/26/2013 1200   HGBUR MODERATE 06/26/2013 1200   BILIRUBINUR NEGATIVE 06/26/2013 1200   KETONESUR NEGATIVE 06/26/2013 1200   PROTEINUR >300 (A) 01/04/2010 1032   UROBILINOGEN 0.2 06/26/2013 1200   NITRITE NEGATIVE 06/26/2013 1200   LEUKOCYTESUR TRACE 06/26/2013 1200   Sepsis Labs: @LABRCNTIP (procalcitonin:4,lacticidven:4)  ) Recent Results (from the past 240 hour(s))  Culture, blood (routine x 2)     Status: Abnormal   Collection Time: 08/19/18  5:07 PM  Result Value Ref Range Status   Specimen Description BLOOD RIGHT ANTECUBITAL  Final   Special Requests   Final    BOTTLES DRAWN AEROBIC AND ANAEROBIC Blood Culture  adequate volume   Culture  Setup Time   Final    GRAM NEGATIVE RODS IN BOTH AEROBIC AND ANAEROBIC BOTTLES CRITICAL RESULT CALLED TO, READ BACK BY AND VERIFIED WITHLenna Sciara [facility] PHARMD 0539 08/20/18 A BROWNING    Culture (A)  Final    KLEBSIELLA PNEUMONIAE SUSCEPTIBILITIES PERFORMED ON PREVIOUS CULTURE WITHIN THE LAST 5 DAYS. Performed at [facility], [place] [address], [place], [place] [address]    Report Status 08/22/2018 FINAL  Final  Culture, blood (routine x 2)     Status: Abnormal   Collection Time: 08/19/18  5:23 PM  Result Value Ref Range Status   Specimen Description BLOOD RIGHT ARM  Final   Special  Requests   Final    BOTTLES DRAWN AEROBIC AND ANAEROBIC Blood Culture results may not be optimal due to an inadequate volume of blood received in culture bottles   Culture  Setup Time   Final    GRAM NEGATIVE RODS IN BOTH AEROBIC AND ANAEROBIC BOTTLES CRITICAL RESULT CALLED TO, READ BACK BY AND VERIFIED WITHKarsten Ro [facility] 1601 08/20/18 A BROWNING Performed at [facility], [place] [address] [place], [place] [address]    Culture KLEBSIELLA PNEUMONIAE (A)  Final   Report Status 08/22/2018 FINAL  Final   Organism ID, Bacteria KLEBSIELLA PNEUMONIAE  Final      Susceptibility   Klebsiella pneumoniae - MIC*    AMPICILLIN >=32 RESISTANT Resistant     CEFAZOLIN <=4 SENSITIVE Sensitive     CEFEPIME <=1 SENSITIVE Sensitive     CEFTAZIDIME <=1 SENSITIVE Sensitive     CEFTRIAXONE <=1 SENSITIVE Sensitive     CIPROFLOXACIN <=0.25 SENSITIVE Sensitive     GENTAMICIN <=1 SENSITIVE Sensitive     IMIPENEM <=0.25 SENSITIVE Sensitive     TRIMETH/SULFA <=20 SENSITIVE Sensitive     AMPICILLIN/SULBACTAM 16 INTERMEDIATE Intermediate     PIP/TAZO 16 SENSITIVE Sensitive     Extended ESBL NEGATIVE Sensitive     * KLEBSIELLA PNEUMONIAE  Blood Culture ID Panel (Reflexed)     Status: Abnormal   Collection Time: 08/19/18  5:23 PM  Result Value Ref Range Status   Enterococcus species NOT  DETECTED NOT DETECTED Final   Listeria monocytogenes NOT DETECTED NOT DETECTED Final   Staphylococcus species NOT DETECTED NOT DETECTED Final   Staphylococcus aureus (BCID) NOT DETECTED NOT DETECTED Final   Streptococcus species NOT DETECTED NOT DETECTED Final   Streptococcus agalactiae NOT DETECTED NOT DETECTED Final   Streptococcus pneumoniae NOT DETECTED NOT DETECTED Final   Streptococcus pyogenes NOT DETECTED NOT DETECTED Final   Acinetobacter baumannii NOT DETECTED NOT DETECTED Final   Enterobacteriaceae species DETECTED (A) NOT DETECTED Final    Comment: Enterobacteriaceae represent a large family of gram-negative bacteria, not a single organism. CRITICAL RESULT CALLED TO, READ BACK BY AND VERIFIED WITH: J [facility] PHARMD [id number] 08/20/18 A BROWNING    Enterobacter cloacae complex NOT DETECTED NOT DETECTED Final   Escherichia coli NOT DETECTED NOT DETECTED Final   Klebsiella oxytoca NOT DETECTED NOT DETECTED Final   Klebsiella pneumoniae DETECTED (A) NOT DETECTED Final    Comment: CRITICAL RESULT CALLED TO, READ BACK BY AND VERIFIED WITH: Karsten Ro PHARMD 2202 08/20/18 A BROWNING    Proteus species NOT DETECTED NOT DETECTED Final   Serratia marcescens NOT DETECTED NOT DETECTED Final   Carbapenem resistance NOT DETECTED NOT DETECTED Final   Haemophilus influenzae NOT DETECTED NOT DETECTED Final   Neisseria meningitidis NOT DETECTED NOT DETECTED Final   Pseudomonas aeruginosa NOT DETECTED NOT DETECTED Final   Candida albicans NOT DETECTED NOT DETECTED Final   Candida glabrata NOT DETECTED NOT DETECTED Final   Candida krusei NOT DETECTED NOT DETECTED Final   Candida parapsilosis NOT DETECTED NOT DETECTED Final   Candida tropicalis NOT DETECTED NOT DETECTED Final    Comment: Performed at [facility], [place] [address], [place], [place] [address]  MRSA PCR Screening     Status: None   Collection Time: 08/20/18 12:02 PM  Result Value Ref Range Status   MRSA by PCR NEGATIVE  NEGATIVE Final    Comment:        The GeneXpert MRSA Assay (FDA approved for NASAL specimens  only), is one component of a comprehensive MRSA colonization surveillance program. It is not intended to diagnose MRSA infection nor to guide or monitor treatment for MRSA infections. Performed at [facility], [place] [address] [place], [place] [address]   C difficile quick scan w PCR reflex     Status: Abnormal   Collection Time: 08/21/18 11:24 AM  Result Value Ref Range Status   C Diff antigen POSITIVE (A) NEGATIVE Final   C Diff toxin NEGATIVE NEGATIVE Final   C Diff interpretation Results are indeterminate. See PCR results.  Final    Comment: Performed at [facility], [place] [address] [place], [place] [address]  C. Diff by PCR, Reflexed     Status: Abnormal   Collection Time: 08/21/18 11:24 AM  Result Value Ref Range Status   Toxigenic C. Difficile by PCR POSITIVE (A) NEGATIVE Final    Comment: Positive for toxigenic C. difficile with little to no toxin production. Only treat if clinical presentation suggests symptomatic illness. Performed at [facility], [place] [address] [place], [place] [address]       Studies: Dg Wrist 2 Views Left  Result Date: 08/27/2018 CLINICAL DATA:  Painful mildly swollen left wrist.  No known injury. EXAM: LEFT WRIST - 2 VIEW COMPARISON:  Left wrist series of Feb 20, 2007 FINDINGS: The bones are subjectively adequately mineralized. There is deformity of the scaphoid consistent with a previous fracture through the junction of the middle and distal thirds of this bone. There are cystic changes within the distal ulna, waist of the scaphoid, capitate, hamate, and trapezoid. The joint spaces are reasonably maintained with the exception of the first [facility] joint where there is mild narrowing. IMPRESSION: Probable old scaphoid fracture with deformity of the distal aspect of this bone. Cystic changes within multiple carpal bones as well as the distal ulna  may reflect gout. Degenerative narrowing of the first [facility] joint. Electronically Signed   By: David  Martinique M.D.   On: 08/27/2018 16:34    Scheduled Meds: . allopurinol  100 mg Oral Daily  . aspirin  81 mg Oral Daily  . Chlorhexidine Gluconate Cloth  6 each Topical Q0600  . Chlorhexidine Gluconate Cloth  6 each Topical Q0600  . cinacalcet  90 mg Oral BID WC  . citalopram  10 mg Oral QHS  . colchicine  0.6 mg Oral Once  . colesevelam  1,875 mg Oral BID WC  . doxercalciferol  4 mcg Intravenous Q T,Th,Sa-HD  . ezetimibe  10 mg Oral Daily  . famotidine  20 mg Oral Daily  . feeding supplement (NEPRO CARB STEADY)  237 mL Oral BID BM  . ferric citrate  420 mg Oral TID WC  . gabapentin  200 mg Oral TID  . heparin injection (subcutaneous)  5,000 Units Subcutaneous Q8H  . insulin aspart  0-9 Units Subcutaneous TID WC  . methimazole  2.5 mg Oral Daily  . multivitamin  1 tablet Oral Daily  . vancomycin  125 mg Oral Q6H    Continuous Infusions: . cefTRIAXone (ROCEPHIN)  IV 2 g (08/26/18 2100)  . dextrose Stopped (08/25/18 0845)     LOS: 8 days     Kayleen Memos, MD [facility] Pager [phone]  If 7PM-7AM, please contact night-coverage [email or web address] Password [facility] 08/27/2018, 4:42 PM

## 2018-08-28 LAB — GLUCOSE, CAPILLARY
Glucose-Capillary: 133 mg/dL — ABNORMAL HIGH (ref 70–99)
Glucose-Capillary: 290 mg/dL — ABNORMAL HIGH (ref 70–99)
Glucose-Capillary: 121 mg/dL — ABNORMAL HIGH (ref 70–99)

## 2018-08-28 MED ORDER — KETOROLAC TROMETHAMINE 10 MG PO TABS
10.0000 mg | ORAL_TABLET | Freq: Once | ORAL | Status: AC
  Administered 2018-08-28: 10 mg via ORAL
  Filled 2018-08-28: qty 1

## 2018-08-28 MED ORDER — KETOROLAC TROMETHAMINE 10 MG PO TABS: 10.0000 mg | ORAL_TABLET | Freq: Three times a day (TID) | ORAL | Status: DC

## 2018-08-28 NOTE — Progress Notes (Signed)
PROGRESS NOTE  [name] QZR:[id number] DOB: 02/14/1951 DOA: 08/19/2018 PCP: Biagio Borg, MD  HPI/Recap of past 24 hours: 67 yo male was at outpt HD and developed abdominal pain with fever 104F.  Found to have recurrent Klebsiella bacteremia.  He developed hypotension while in hospital.  08/27/18: He is alert and interactive in the HD center. Reports left wrist swelling. Xray done with suspicion for gout. One dose colchicine given 0.6 once.  08/28/2018: Patient seen and examined at his bedside.  No acute events overnight.  Reports his left wrist pain is improving post colchicine.  Has no other complaints.   Assessment/Plan: Principal Problem:   Sepsis ([place]) Active Problems:   Ischemic cardiomyopathy   OSA (obstructive sleep apnea)   Hyperthyroidism   Acute encephalopathy   DM (diabetes mellitus) type II controlled with renal manifestation ([name])   PAD (peripheral artery disease) ([place])   Status post coronary artery stent placement   ESRD on hemodialysis ([place])   Encounter for hemodialysis ([place])   Chronic GI bleeding   Chronic combined systolic (congestive) and diastolic (congestive) heart failure ([name])   Altered mental status   Gram-negative bacteremia   Septic shock 2/2 Recurrent klebsiella bacteremia Sepsis physiology resolving Positive blood cx from 08/19/18 Repeat blood cultures x2 ID following and recommends 2 weeks of IV ceftaz post HD. 08/21/18 as day 1 of 14 through 09/04/2018. Continue IV ceftaz as recommended by ID post hemodialysis  Presumed left wrist gout Continue allopurinol Given 1 dose of colchicine with some improvement We will give dose of Toradol if worsening pain  C-diff infection Continue po vancomycin 125 mg qid for additional 10 days after IV abx has need per ID through 09/14/2018. Follow up with ID oupt  ESRD on HD on Tuesday Thursday Saturday Last HD on 08/27/2018 Nephrology following  OSA cpap qhs  Hx of gout with suspected gout  flare to left wrist 1 dose 0.6 mg colchicine given once May give 1 dose of Toradol if worsening pain  DM with peripheral neuropathy. Plan - SSI - resume gabapentin, celexa  Hx of hyperthyroidism. Plan - methimazole   Diet: Low carbohydrate diet DVT prophylaxis: Subcu heparin GI prophylaxis: PPI change to H2 blockade due to C diff Mobility: OOB Code Status: Full code Family Communication: no family at bedside   Objective: Vitals:   08/27/18 1817 08/27/18 2102 08/28/18 0607 08/28/18 1102  BP: 128/66 (!) 106/58 (!) 103/52 (!) 112/53  Pulse: 87 76 88 81  Resp: 18 18 18 18   Temp: 98.6 F (37 C) 98.9 F (37.2 C) 99.2 F (37.3 C) 99.1 F (37.3 C)  TempSrc: Oral Oral Oral Oral  SpO2: 100% 98% 90% 94%  Weight:      Height:        Intake/Output Summary (Last 24 hours) at 08/28/2018 1418 Last data filed at 08/28/2018 0900 Gross per 24 hour  Intake 0 ml  Output 0 ml  Net 0 ml   Filed Weights   08/26/18 1145 08/27/18 0736 08/27/18 1150  Weight: 99.1 kg 101.3 kg 99.5 kg    Exam:  . General: 67 y.o. year-old male well-developed well-nourished in no acute distress.  Alert and oriented x3.   . Cardiovascular: Regular rate and rhythm with no rubs or gallops.  No JVD or thyromegaly noted.   [name] Respiratory: Clear to auscultation with no wheezes or rales. Good inspiratory effort. . Abdomen: Soft nontender nondistended with normal bowel sounds x4 quadrants. . Musculoskeletal: left wrist mild edema  and less tender than yesterday. 2/4 pulses in all 4 extremities. [name] Psychiatry: Mood is appropriate for condition and setting   Data Reviewed: CBC: Recent Labs  Lab 08/23/18 0334 08/24/18 0549 08/25/18 0548 08/26/18 0625 08/27/18 0529  WBC 15.8* 9.2 10.1 7.7 5.8  NEUTROABS 11.2* 6.0 5.8 4.0 2.9  HGB 10.5* 10.9* 10.4* 10.5* 10.4*  HCT 34.0* 36.2* 32.8* 33.5* 33.1*  MCV 93.7 95.5 92.7 92.5 93.0  PLT 133* 133* 152 188 703   Basic Metabolic Panel: Recent Labs  Lab  08/23/18 0334 08/24/18 0615 08/25/18 0457 08/25/18 0548 08/26/18 0625 08/26/18 0828 08/27/18 0529  NA 131* 129*  --  136  --  136 137  K 4.5 4.7  --  3.9  --  4.1 3.7  CL 100 99  --  101  --  102 101  CO2 21* 20*  --  25  --  21* 24  GLUCOSE 185* 226*  --  96  --  100* 112*  BUN 30* 48*  --  36*  --  52* 26*  CREATININE 4.45* 5.65*  --  5.26*  --  7.38* 5.42*  CALCIUM 8.9 8.9  --  8.4*  --  8.2* 8.3*  MG 2.4 2.5* 1.3* 2.3 2.4  --  2.2  PHOS 3.9 5.7* 2.7 4.9* 7.5* 7.5* 5.7*   GFR: Estimated Creatinine Clearance: 15.7 mL/min (A) (by C-G formula based on SCr of 5.42 mg/dL (H)). Liver Function Tests: Recent Labs  Lab 08/21/18 1626 08/24/18 0615 08/26/18 0828 08/27/18 0529  ALBUMIN 2.3* 2.0* 2.1* 2.2*   No results for input(s): LIPASE, AMYLASE in the last 168 hours. No results for input(s): AMMONIA in the last 168 hours. Coagulation Profile: No results for input(s): INR, PROTIME in the last 168 hours. Cardiac Enzymes: No results for input(s): CKTOTAL, CKMB, CKMBINDEX, TROPONINI in the last 168 hours. BNP (last 3 results) No results for input(s): PROBNP in the last 8760 hours. HbA1C: No results for input(s): HGBA1C in the last 72 hours. CBG: Recent Labs  Lab 08/27/18 1236 08/27/18 1613 08/27/18 2102 08/28/18 0748 08/28/18 1130  GLUCAP 76 185* 125* 133* 290*   Lipid Profile: No results for input(s): CHOL, HDL, LDLCALC, TRIG, CHOLHDL, LDLDIRECT in the last 72 hours. Thyroid Function Tests: No results for input(s): TSH, T4TOTAL, FREET4, T3FREE, THYROIDAB in the last 72 hours. Anemia Panel: No results for input(s): VITAMINB12, FOLATE, FERRITIN, TIBC, IRON, RETICCTPCT in the last 72 hours. Urine analysis:    Component Value Date/Time   COLORURINE LT. YELLOW 06/26/2013 1200   APPEARANCEUR CLEAR 06/26/2013 1200   LABSPEC 1.020 06/26/2013 1200   PHURINE 8.5 06/26/2013 1200   GLUCOSEU 100 06/26/2013 1200   HGBUR MODERATE 06/26/2013 1200   BILIRUBINUR NEGATIVE  06/26/2013 1200   KETONESUR NEGATIVE 06/26/2013 1200   PROTEINUR >300 (A) 01/04/2010 1032   UROBILINOGEN 0.2 06/26/2013 1200   NITRITE NEGATIVE 06/26/2013 1200   LEUKOCYTESUR TRACE 06/26/2013 1200   Sepsis Labs: @LABRCNTIP (procalcitonin:4,lacticidven:4)  ) Recent Results (from the past 240 hour(s))  Culture, blood (routine x 2)     Status: Abnormal   Collection Time: 08/19/18  5:07 PM  Result Value Ref Range Status   Specimen Description BLOOD RIGHT ANTECUBITAL  Final   Special Requests   Final    BOTTLES DRAWN AEROBIC AND ANAEROBIC Blood Culture adequate volume   Culture  Setup Time   Final    GRAM NEGATIVE RODS IN BOTH AEROBIC AND ANAEROBIC BOTTLES CRITICAL RESULT CALLED TO, READ BACK BY AND  VERIFIED WITHLenna Sciara [facility] PHARMD [id number] 08/20/18 A BROWNING    Culture (A)  Final    KLEBSIELLA PNEUMONIAE SUSCEPTIBILITIES PERFORMED ON PREVIOUS CULTURE WITHIN THE LAST 5 DAYS. Performed at [facility], [name] [address] [place], [place] [address]    Report Status 08/22/2018 FINAL  Final  Culture, blood (routine x 2)     Status: Abnormal   Collection Time: 08/19/18  5:23 PM  Result Value Ref Range Status   Specimen Description BLOOD RIGHT ARM  Final   Special Requests   Final    BOTTLES DRAWN AEROBIC AND ANAEROBIC Blood Culture results may not be optimal due to an inadequate volume of blood received in culture bottles   Culture  Setup Time   Final    GRAM NEGATIVE RODS IN BOTH AEROBIC AND ANAEROBIC BOTTLES CRITICAL RESULT CALLED TO, READ BACK BY AND VERIFIED WITHKarsten Ro [facility] [id number] 08/20/18 A BROWNING Performed at [facility], [place] [address], [place], [place] [address]    Culture KLEBSIELLA PNEUMONIAE (A)  Final   Report Status 08/22/2018 FINAL  Final   Organism ID, Bacteria KLEBSIELLA PNEUMONIAE  Final      Susceptibility   Klebsiella pneumoniae - MIC*    AMPICILLIN >=32 RESISTANT Resistant     CEFAZOLIN <=4 SENSITIVE Sensitive     CEFEPIME <=1 SENSITIVE Sensitive      CEFTAZIDIME <=1 SENSITIVE Sensitive     CEFTRIAXONE <=1 SENSITIVE Sensitive     CIPROFLOXACIN <=0.25 SENSITIVE Sensitive     GENTAMICIN <=1 SENSITIVE Sensitive     IMIPENEM <=0.25 SENSITIVE Sensitive     TRIMETH/SULFA <=20 SENSITIVE Sensitive     AMPICILLIN/SULBACTAM 16 INTERMEDIATE Intermediate     PIP/TAZO 16 SENSITIVE Sensitive     Extended ESBL NEGATIVE Sensitive     * KLEBSIELLA PNEUMONIAE  Blood Culture ID Panel (Reflexed)     Status: Abnormal   Collection Time: 08/19/18  5:23 PM  Result Value Ref Range Status   Enterococcus species NOT DETECTED NOT DETECTED Final   Listeria monocytogenes NOT DETECTED NOT DETECTED Final   Staphylococcus species NOT DETECTED NOT DETECTED Final   Staphylococcus aureus (BCID) NOT DETECTED NOT DETECTED Final   Streptococcus species NOT DETECTED NOT DETECTED Final   Streptococcus agalactiae NOT DETECTED NOT DETECTED Final   Streptococcus pneumoniae NOT DETECTED NOT DETECTED Final   Streptococcus pyogenes NOT DETECTED NOT DETECTED Final   Acinetobacter baumannii NOT DETECTED NOT DETECTED Final   Enterobacteriaceae species DETECTED (A) NOT DETECTED Final    Comment: Enterobacteriaceae represent a large family of gram-negative bacteria, not a single organism. CRITICAL RESULT CALLED TO, READ BACK BY AND VERIFIED WITH: J [facility] PHARMD [id number] 08/20/18 A BROWNING    Enterobacter cloacae complex NOT DETECTED NOT DETECTED Final   Escherichia coli NOT DETECTED NOT DETECTED Final   Klebsiella oxytoca NOT DETECTED NOT DETECTED Final   Klebsiella pneumoniae DETECTED (A) NOT DETECTED Final    Comment: CRITICAL RESULT CALLED TO, READ BACK BY AND VERIFIED WITH: Karsten Ro PHARMD [id number] 08/20/18 A BROWNING    Proteus species NOT DETECTED NOT DETECTED Final   Serratia marcescens NOT DETECTED NOT DETECTED Final   Carbapenem resistance NOT DETECTED NOT DETECTED Final   Haemophilus influenzae NOT DETECTED NOT DETECTED Final   Neisseria meningitidis NOT DETECTED NOT  DETECTED Final   Pseudomonas aeruginosa NOT DETECTED NOT DETECTED Final   Candida albicans NOT DETECTED NOT DETECTED Final   Candida glabrata NOT DETECTED NOT DETECTED Final   Candida krusei  NOT DETECTED NOT DETECTED Final   Candida parapsilosis NOT DETECTED NOT DETECTED Final   Candida tropicalis NOT DETECTED NOT DETECTED Final    Comment: Performed at [facility], [place] [address] [place], [place] [address]  MRSA PCR Screening     Status: None   Collection Time: 08/20/18 12:02 PM  Result Value Ref Range Status   MRSA by PCR NEGATIVE NEGATIVE Final    Comment:        The GeneXpert MRSA Assay (FDA approved for NASAL specimens only), is one component of a comprehensive MRSA colonization surveillance program. It is not intended to diagnose MRSA infection nor to guide or monitor treatment for MRSA infections. Performed at [facility], [place] [address] [place], [place] [address]   C difficile quick scan w PCR reflex     Status: Abnormal   Collection Time: 08/21/18 11:24 AM  Result Value Ref Range Status   C Diff antigen POSITIVE (A) NEGATIVE Final   C Diff toxin NEGATIVE NEGATIVE Final   C Diff interpretation Results are indeterminate. See PCR results.  Final    Comment: Performed at [facility], [place] [address], [place], [place] [address]  C. Diff by PCR, Reflexed     Status: Abnormal   Collection Time: 08/21/18 11:24 AM  Result Value Ref Range Status   Toxigenic C. Difficile by PCR POSITIVE (A) NEGATIVE Final    Comment: Positive for toxigenic C. difficile with little to no toxin production. Only treat if clinical presentation suggests symptomatic illness. Performed at [facility], [place] [address], [place], [place] [address]       Studies: Dg Wrist 2 Views Left  Result Date: 08/27/2018 CLINICAL DATA:  Painful mildly swollen left wrist.  No known injury. EXAM: LEFT WRIST - 2 VIEW COMPARISON:  Left wrist series of Feb 20, 2007 FINDINGS: The bones are  subjectively adequately mineralized. There is deformity of the scaphoid consistent with a previous fracture through the junction of the middle and distal thirds of this bone. There are cystic changes within the distal ulna, waist of the scaphoid, capitate, hamate, and trapezoid. The joint spaces are reasonably maintained with the exception of the first [facility] joint where there is mild narrowing. IMPRESSION: Probable old scaphoid fracture with deformity of the distal aspect of this bone. Cystic changes within multiple carpal bones as well as the distal ulna may reflect gout. Degenerative narrowing of the first [facility] joint. Electronically Signed   By: David  Martinique M.D.   On: 08/27/2018 16:34    Scheduled Meds: . allopurinol  100 mg Oral Daily  . aspirin  81 mg Oral Daily  . Chlorhexidine Gluconate Cloth  6 each Topical Q0600  . Chlorhexidine Gluconate Cloth  6 each Topical Q0600  . cinacalcet  90 mg Oral BID WC  . citalopram  10 mg Oral QHS  . colesevelam  1,875 mg Oral BID WC  . doxercalciferol  4 mcg Intravenous Q T,Th,Sa-HD  . ezetimibe  10 mg Oral Daily  . famotidine  20 mg Oral Daily  . feeding supplement (NEPRO CARB STEADY)  237 mL Oral BID BM  . ferric citrate  420 mg Oral TID WC  . gabapentin  200 mg Oral TID  . heparin injection (subcutaneous)  5,000 Units Subcutaneous Q8H  . insulin aspart  0-9 Units Subcutaneous TID WC  . methimazole  2.5 mg Oral Daily  . multivitamin  1 tablet Oral Daily  . vancomycin  125 mg Oral Q6H    Continuous Infusions: . cefTRIAXone (ROCEPHIN)  IV 2 g (08/27/18 2013)  . dextrose Stopped (08/25/18 0845)     LOS: 9 days     Kayleen Memos, MD [facility] Pager [phone]  If 7PM-7AM, please contact night-coverage [email or web address] Password [email or web address] 08/28/2018, 2:18 PM

## 2018-08-28 NOTE — Progress Notes (Addendum)
[facility] Progress Note   Dialysis Orders: SW TTS 4h 98.5kg 2/2.25 bath P2  [name] 4 no ESA/FeHep none   Assessment/Plan: 1. Klebsiella sepsis - recurrent bacteremia - source unclear - on IV Rocephin -ID rec 2 weeks of [place] counting 11/21 as day 1 TEE neg for endocarditis. TMax 99.2 2. ESRD - TTS K 3.7 - 4 K bath today Wed, required short term CRRT 11/20 - 11/22 - next HD Saturday 3. Anemia - hgb 10.4 - normally in the 12s - if < 10 start ESA 4. Secondary hyperparathyroidism - resumed [name] 4 - on sensipar but no binder P 5.7 - resumed Aurixya 5. HTN/volume - bed weights above EDW  6. Nutrition - renal carb mod diet - has renavites and nepro supplements- said he will drink 7. Left wrist pain - xray showed "Probable old scaphoid fracture with deformity of the distal aspect of this bone. Cystic changes within multiple carpal bones as well as the distal ulna may reflect gout. Degenerative narrowing of the first [facility] joint." On allopurinol. Try toradol 10 mg x 1 to see if that eases symptoms.- given one dose colchicine by primary yesterday 8. C diff - oral Vanc through 12/15 - contact precautions after d/c   Myriam Jacobson, PA-C [place] [phone] 08/28/2018,8:04 AM  LOS: 9 days   Pt seen, examined and agree w A/P as above.  Kelly Splinter MD [facility] pager [phone]   08/28/2018, 1:27 PM    Subjective:   Not fully awake yet and ready to eat.  Left wrist pain. is biggest complaint. Discussed xray findings - has been on allopurinol.  Prior gout has been in feet only   Objective Vitals:   08/27/18 1237 08/27/18 1817 08/27/18 2102 08/28/18 0607  BP: 113/64 128/66 (!) 106/58 (!) 103/52  Pulse: 85 87 76 88  Resp: 18 18 18 18   Temp: 98.7 F (37.1 C) 98.6 F (37 C) 98.9 F (37.2 C) 99.2 F (37.3 C)  TempSrc: Oral Oral Oral Oral  SpO2: 100% 100% 98% 90%  Weight:      Height:       Physical Exam General:  breathing easily supine in bed Heart: RRR 2/6 murmur Lungs: grossly clear Abdomen: obese soft NTND + BS Extremities: no LE edema, left wrist still puffy and tender Dialysis Access:  Right upper AVF + bruit   Additional Objective Labs: Basic Metabolic Panel: Recent Labs  Lab 08/25/18 0548 08/26/18 0625 08/26/18 0828 08/27/18 0529  NA 136  --  136 137  K 3.9  --  4.1 3.7  CL 101  --  102 101  CO2 25  --  21* 24  GLUCOSE 96  --  100* 112*  BUN 36*  --  52* 26*  CREATININE 5.26*  --  7.38* 5.42*  CALCIUM 8.4*  --  8.2* 8.3*  PHOS 4.9* 7.5* 7.5* 5.7*   Liver Function Tests: Recent Labs  Lab 08/24/18 0615 08/26/18 0828 08/27/18 0529  ALBUMIN 2.0* 2.1* 2.2*   No results for input(s): LIPASE, AMYLASE in the last 168 hours. CBC: Recent Labs  Lab 08/23/18 0334 08/24/18 0549 08/25/18 0548 08/26/18 0625 08/27/18 0529  WBC 15.8* 9.2 10.1 7.7 5.8  NEUTROABS 11.2* 6.0 5.8 4.0 2.9  HGB 10.5* 10.9* 10.4* 10.5* 10.4*  HCT 34.0* 36.2* 32.8* 33.5* 33.1*  MCV 93.7 95.5 92.7 92.5 93.0  PLT 133* 133* 152 188 203   Blood Culture    Component Value Date/Time  SDES BLOOD RIGHT ARM 08/19/2018 1723   [place]  08/19/2018 1723    BOTTLES DRAWN AEROBIC AND ANAEROBIC Blood Culture results may not be optimal due to an inadequate volume of blood received in culture bottles   CULT KLEBSIELLA PNEUMONIAE (A) 08/19/2018 1723   REPTSTATUS 08/22/2018 FINAL 08/19/2018 1723    Cardiac Enzymes: No results for input(s): CKTOTAL, CKMB, CKMBINDEX, TROPONINI in the last 168 hours. CBG: Recent Labs  Lab 08/26/18 2203 08/27/18 1236 08/27/18 1613 08/27/18 2102 08/28/18 0748  GLUCAP 142* 76 185* 125* 133*   Iron Studies: No results for input(s): IRON, TIBC, TRANSFERRIN, FERRITIN in the last 72 hours. Lab Results  Component Value Date   INR 1.15 08/19/2018   INR 1.12 07/16/2018   INR 1.09 07/16/2018   Studies/Results: Dg Wrist 2 Views Left  Result Date: 08/27/2018 CLINICAL DATA:   Painful mildly swollen left wrist.  No known injury. EXAM: LEFT WRIST - 2 VIEW COMPARISON:  Left wrist series of Feb 20, 2007 FINDINGS: The bones are subjectively adequately mineralized. There is deformity of the scaphoid consistent with a previous fracture through the junction of the middle and distal thirds of this bone. There are cystic changes within the distal ulna, waist of the scaphoid, capitate, hamate, and trapezoid. The joint spaces are reasonably maintained with the exception of the first [facility] joint where there is mild narrowing. IMPRESSION: Probable old scaphoid fracture with deformity of the distal aspect of this bone. Cystic changes within multiple carpal bones as well as the distal ulna may reflect gout. Degenerative narrowing of the first [facility] joint. Electronically Signed   By: David  Martinique M.D.   On: 08/27/2018 16:34   Medications: . cefTRIAXone (ROCEPHIN)  IV 2 g (08/27/18 2013)  . dextrose Stopped (08/25/18 0845)   . allopurinol  100 mg Oral Daily  . aspirin  81 mg Oral Daily  . Chlorhexidine Gluconate Cloth  6 each Topical Q0600  . Chlorhexidine Gluconate Cloth  6 each Topical Q0600  . cinacalcet  90 mg Oral BID WC  . citalopram  10 mg Oral QHS  . colesevelam  1,875 mg Oral BID WC  . doxercalciferol  4 mcg Intravenous Q T,Th,Sa-HD  . ezetimibe  10 mg Oral Daily  . famotidine  20 mg Oral Daily  . feeding supplement (NEPRO CARB STEADY)  237 mL Oral BID BM  . ferric citrate  420 mg Oral TID WC  . gabapentin  200 mg Oral TID  . heparin injection (subcutaneous)  5,000 Units Subcutaneous Q8H  . insulin aspart  0-9 Units Subcutaneous TID WC  . methimazole  2.5 mg Oral Daily  . multivitamin  1 tablet Oral Daily  . vancomycin  125 mg Oral Q6H

## 2018-08-28 NOTE — Progress Notes (Signed)
RT offered to place pt on cpap for the night pt states he can do himself. RT will continue to monitor.

## 2018-08-29 ENCOUNTER — Other Ambulatory Visit: Payer: Self-pay | Admitting: *Deleted

## 2018-08-29 DIAGNOSIS — K922 Gastrointestinal hemorrhage, unspecified: Secondary | ICD-10-CM

## 2018-08-29 DIAGNOSIS — E1121 Type 2 diabetes mellitus with diabetic nephropathy: Secondary | ICD-10-CM

## 2018-08-29 LAB — RENAL FUNCTION PANEL
Albumin: 2.3 g/dL — ABNORMAL LOW (ref 3.5–5.0)
Anion gap: 13 (ref 5–15)
BUN: 39 mg/dL — ABNORMAL HIGH (ref 8–23)
CO2: 24 mmol/L (ref 22–32)
Calcium: 9 mg/dL (ref 8.9–10.3)
Chloride: 103 mmol/L (ref 98–111)
Creatinine, Ser: 8.56 mg/dL — ABNORMAL HIGH (ref 0.61–1.24)
GFR calc Af Amer: 7 mL/min — ABNORMAL LOW (ref >60)
GFR calc non Af Amer: 6 mL/min — ABNORMAL LOW (ref >60)
Glucose, Bld: 157 mg/dL — ABNORMAL HIGH (ref 70–99)
Phosphorus: 4.7 mg/dL — ABNORMAL HIGH (ref 2.5–4.6)
Potassium: 4.3 mmol/L (ref 3.5–5.1)
Sodium: 140 mmol/L (ref 135–145)

## 2018-08-29 LAB — BASIC METABOLIC PANEL
Anion gap: 8 (ref 5–15)
BUN: 30 mg/dL — ABNORMAL HIGH (ref 8–23)
CALCIUM: 9.1 mg/dL (ref 8.9–10.3)
CHLORIDE: 105 mmol/L (ref 98–111)
CO2: 26 mmol/L (ref 22–32)
Creatinine, Ser: 7.13 mg/dL — ABNORMAL HIGH (ref 0.61–1.24)
GFR calc non Af Amer: 7 mL/min — ABNORMAL LOW (ref >60)
GFR, EST AFRICAN AMERICAN: 8 mL/min — AB (ref >60)
GLUCOSE: 152 mg/dL — AB (ref 70–99)
Potassium: 4.3 mmol/L (ref 3.5–5.1)
Sodium: 139 mmol/L (ref 135–145)

## 2018-08-29 LAB — CBC WITH DIFFERENTIAL/PLATELET
Abs Immature Granulocytes: 0.14 10*3/uL — ABNORMAL HIGH (ref 0.00–0.07)
BASOS ABS: 0 10*3/uL (ref 0.0–0.1)
Basophils Relative: 1 %
Eosinophils Absolute: 0.2 10*3/uL (ref 0.0–0.5)
Eosinophils Relative: 3 %
HEMATOCRIT: 35.7 % — AB (ref 39.0–52.0)
Hemoglobin: 10.7 g/dL — ABNORMAL LOW (ref 13.0–17.0)
IMMATURE GRANULOCYTES: 3 %
LYMPHS ABS: 1.3 10*3/uL (ref 0.7–4.0)
LYMPHS PCT: 23 %
MCH: 28.8 pg (ref 26.0–34.0)
MCHC: 30 g/dL (ref 30.0–36.0)
MCV: 96 fL (ref 80.0–100.0)
MONOS PCT: 15 %
Monocytes Absolute: 0.8 10*3/uL (ref 0.1–1.0)
NEUTROS ABS: 3 10*3/uL (ref 1.7–7.7)
NEUTROS PCT: 55 %
NRBC: 0 % (ref 0.0–0.2)
Platelets: 276 10*3/uL (ref 150–400)
RBC: 3.72 MIL/uL — ABNORMAL LOW (ref 4.22–5.81)
RDW: 17.9 % — AB (ref 11.5–15.5)
WBC: 5.5 10*3/uL (ref 4.0–10.5)

## 2018-08-29 LAB — CBC
HCT: 32.8 % — ABNORMAL LOW (ref 39.0–52.0)
Hemoglobin: 9.8 g/dL — ABNORMAL LOW (ref 13.0–17.0)
MCH: 29 pg (ref 26.0–34.0)
MCHC: 29.9 g/dL — ABNORMAL LOW (ref 30.0–36.0)
MCV: 97 fL (ref 80.0–100.0)
Platelets: 284 10*3/uL (ref 150–400)
RBC: 3.38 MIL/uL — ABNORMAL LOW (ref 4.22–5.81)
RDW: 17.8 % — ABNORMAL HIGH (ref 11.5–15.5)
WBC: 6.5 10*3/uL (ref 4.0–10.5)
nRBC: 0 % (ref 0.0–0.2)

## 2018-08-29 LAB — GLUCOSE, CAPILLARY
Glucose-Capillary: 136 mg/dL — ABNORMAL HIGH (ref 70–99)
Glucose-Capillary: 159 mg/dL — ABNORMAL HIGH (ref 70–99)
Glucose-Capillary: 107 mg/dL — ABNORMAL HIGH (ref 70–99)
Glucose-Capillary: 146 mg/dL — ABNORMAL HIGH (ref 70–99)

## 2018-08-29 MED ORDER — HEPARIN SODIUM (PORCINE) 1000 UNIT/ML DIALYSIS: 1000.0000 [IU] | INTRAMUSCULAR | Status: DC | PRN

## 2018-08-29 MED ORDER — LIDOCAINE-PRILOCAINE 2.5-2.5 % EX CREA: 1.0000 "application " | TOPICAL_CREAM | CUTANEOUS | Status: DC | PRN

## 2018-08-29 MED ORDER — LIDOCAINE HCL (PF) 1 % IJ SOLN: 5.0000 mL | INTRAMUSCULAR | Status: DC | PRN

## 2018-08-29 MED ORDER — SODIUM CHLORIDE 0.9 % IV SOLN: 100.0000 mL | INTRAVENOUS | Status: DC | PRN

## 2018-08-29 MED ORDER — CHLORHEXIDINE GLUCONATE CLOTH 2 % EX PADS: 6.0000 | MEDICATED_PAD | Freq: Every day | CUTANEOUS | Status: DC

## 2018-08-29 MED ORDER — PENTAFLUOROPROP-TETRAFLUOROETH EX AERO: 1.0000 "application " | INHALATION_SPRAY | CUTANEOUS | Status: DC | PRN

## 2018-08-29 MED ORDER — ALTEPLASE 2 MG IJ SOLR
2.0000 mg | Freq: Once | INTRAMUSCULAR | Status: DC | PRN
  Filled 2018-08-29: qty 2

## 2018-08-29 NOTE — Progress Notes (Signed)
PROGRESS NOTE  [name] YDX:[id number] DOB: 03-Jun-1951 DOA: 08/19/2018 PCP: Biagio Borg, MD  HPI/Recap of past 24 hours: 67 yo male was at outpt HD and developed abdominal pain with fever 104F.  Found to have recurrent Klebsiella bacteremia.  He developed hypotension while in hospital.  Hospital course complicated by left wrist swelling with high suspicion for gout.  Improved with colchicine.  08/29/2018: Patient seen and examined at his bedside.  No acute events overnight.  States his left wrist is better.  Denies chest pain, dyspnea or palpitations.  Started having solid stools yesterday x2 bowel movements.  No bowel movement reported yet this morning on p.o. vancomycin for C. difficile.    Assessment/Plan: Principal Problem:   Sepsis ([place]) Active Problems:   Ischemic cardiomyopathy   OSA (obstructive sleep apnea)   Hyperthyroidism   Acute encephalopathy   DM (diabetes mellitus) type II controlled with renal manifestation ([name])   PAD (peripheral artery disease) ([name])   Status post coronary artery stent placement   ESRD on hemodialysis ([place])   Encounter for hemodialysis ([place])   Chronic GI bleeding   Chronic combined systolic (congestive) and diastolic (congestive) heart failure ([name])   Altered mental status   Gram-negative bacteremia   Resolving septic shock 2/2 Recurrent klebsiella bacteremia Sepsis physiology resolving Suspect GI source Patient has a history of rectal ulcers/radiation proctitis from history of prostate cancer Will need to follow-up with his gastroenterologist outpatient.  Has seen [facility] in the past Positive blood cx from 08/19/18 Repeat blood cultures x2 ID following and recommends 2 weeks of IV ceftaz post HD. 08/21/18 as day 1 of 14 through 09/04/2018. Continue IV ceftaz as recommended by ID post hemodialysis  Left wrist acute gout flare, improving Continue allopurinol Given 1 dose of colchicine with some improvement Repeat dose of  colchicine up to 2-3 times a week  C-diff infection, improving Continue po vancomycin 125 mg qid for additional 10 days after IV abx has need per ID through 09/14/2018. Follow up with ID oupt Starting formed stool yesterday 08/28/2018 with decrease of stool frequency  ESRD on HD on Tuesday Thursday Saturday Last HD on 08/27/2018 Nephrology following Possible discharge tomorrow 08/30/2018  OSA cpap qhs  Hx of gout with suspected gout flare to left wrist 1 dose 0.6 mg colchicine given once Can repeat dose of colchicine up to 2-3 times per week  DM with peripheral neuropathy. Plan - SSI - resume gabapentin, celexa  Hx of hyperthyroidism. Plan - methimazole  Physical debility PT to assess Fall precautions Out of bed to chair with assistance Ambulate patient with assistance   Diet: Low carbohydrate diet DVT prophylaxis: Subcu heparin GI prophylaxis: PPI change to H2 blockade due to C diff Mobility: OOB Code Status: Full code Family Communication: no family at bedside Disposition: Possible discharge tomorrow 08/30/2018 to home.  Awaiting PT to assess.   Objective: Vitals:   08/28/18 1642 08/28/18 2046 08/29/18 0437 08/29/18 1620  BP: 122/60 107/62 137/70 (!) 157/68  Pulse: 85 88 79 75  Resp: 16 20 18 18   Temp: 98.7 F (37.1 C) 98 F (36.7 C) 98.2 F (36.8 C)   TempSrc: Oral Oral Oral   SpO2: 93% 97% 96% 94%  Weight:      Height:        Intake/Output Summary (Last 24 hours) at 08/29/2018 1635 Last data filed at 08/29/2018 1350 Gross per 24 hour  Intake 780 ml  Output -  Net 780 ml  Filed Weights   08/26/18 1145 08/27/18 0736 08/27/18 1150  Weight: 99.1 kg 101.3 kg 99.5 kg    Exam:  . General: 67 y.o. year-old male well-developed well-nourished no acute distress.  Alert oriented x3.   . Cardiovascular: Regular rate and rhythm with no rubs or gallops.  No JVD or thyromegaly noted. [name] Respiratory: Clear to auscultation with no wheezes or rales.  Good  inspiratory effort. . Abdomen: Soft nontender nondistended with normal bowel sounds x4 quadrants. . Musculoskeletal: left wrist mild edema and less tender than yesterday. 2/4 pulses in all 4 extremities. [name] Psychiatry: Mood is appropriate for condition and setting   Data Reviewed: CBC: Recent Labs  Lab 08/24/18 0549 08/25/18 0548 08/26/18 0625 08/27/18 0529 08/29/18 0710  WBC 9.2 10.1 7.7 5.8 5.5  NEUTROABS 6.0 5.8 4.0 2.9 3.0  HGB 10.9* 10.4* 10.5* 10.4* 10.7*  HCT 36.2* 32.8* 33.5* 33.1* 35.7*  MCV 95.5 92.7 92.5 93.0 96.0  PLT 133* 152 188 203 638   Basic Metabolic Panel: Recent Labs  Lab 08/24/18 0615 08/25/18 0457 08/25/18 0548 08/26/18 0625 08/26/18 0828 08/27/18 0529 08/29/18 0710  NA 129*  --  136  --  136 137 139  K 4.7  --  3.9  --  4.1 3.7 4.3  CL 99  --  101  --  102 101 105  CO2 20*  --  25  --  21* 24 26  GLUCOSE 226*  --  96  --  100* 112* 152*  BUN 48*  --  36*  --  52* 26* 30*  CREATININE 5.65*  --  5.26*  --  7.38* 5.42* 7.13*  CALCIUM 8.9  --  8.4*  --  8.2* 8.3* 9.1  MG 2.5* 1.3* 2.3 2.4  --  2.2  --   PHOS 5.7* 2.7 4.9* 7.5* 7.5* 5.7*  --    GFR: Estimated Creatinine Clearance: 11.9 mL/min (A) (by C-G formula based on SCr of 7.13 mg/dL (H)). Liver Function Tests: Recent Labs  Lab 08/24/18 0615 08/26/18 0828 08/27/18 0529  ALBUMIN 2.0* 2.1* 2.2*   No results for input(s): LIPASE, AMYLASE in the last 168 hours. No results for input(s): AMMONIA in the last 168 hours. Coagulation Profile: No results for input(s): INR, PROTIME in the last 168 hours. Cardiac Enzymes: No results for input(s): CKTOTAL, CKMB, CKMBINDEX, TROPONINI in the last 168 hours. BNP (last 3 results) No results for input(s): PROBNP in the last 8760 hours. HbA1C: No results for input(s): HGBA1C in the last 72 hours. CBG: Recent Labs  Lab 08/28/18 0748 08/28/18 1130 08/28/18 1644 08/29/18 0744 08/29/18 1059  GLUCAP 133* 290* 121* 136* 159*   Lipid Profile: No  results for input(s): CHOL, HDL, LDLCALC, TRIG, CHOLHDL, LDLDIRECT in the last 72 hours. Thyroid Function Tests: No results for input(s): TSH, T4TOTAL, FREET4, T3FREE, THYROIDAB in the last 72 hours. Anemia Panel: No results for input(s): VITAMINB12, FOLATE, FERRITIN, TIBC, IRON, RETICCTPCT in the last 72 hours. Urine analysis:    Component Value Date/Time   COLORURINE LT. YELLOW 06/26/2013 1200   APPEARANCEUR CLEAR 06/26/2013 1200   LABSPEC 1.020 06/26/2013 1200   PHURINE 8.5 06/26/2013 1200   GLUCOSEU 100 06/26/2013 1200   HGBUR MODERATE 06/26/2013 1200   BILIRUBINUR NEGATIVE 06/26/2013 1200   KETONESUR NEGATIVE 06/26/2013 1200   PROTEINUR >300 (A) 01/04/2010 1032   UROBILINOGEN 0.2 06/26/2013 1200   NITRITE NEGATIVE 06/26/2013 1200   LEUKOCYTESUR TRACE 06/26/2013 1200   Sepsis Labs: @LABRCNTIP (procalcitonin:4,lacticidven:4)  ) Recent  Results (from the past 240 hour(s))  Culture, blood (routine x 2)     Status: Abnormal   Collection Time: 08/19/18  5:07 PM  Result Value Ref Range Status   Specimen Description BLOOD RIGHT ANTECUBITAL  Final   Special Requests   Final    BOTTLES DRAWN AEROBIC AND ANAEROBIC Blood Culture adequate volume   Culture  Setup Time   Final    GRAM NEGATIVE RODS IN BOTH AEROBIC AND ANAEROBIC BOTTLES CRITICAL RESULT CALLED TO, READ BACK BY AND VERIFIED WITHLenna [facility] PHARMD 8921 08/20/18 A BROWNING    Culture (A)  Final    KLEBSIELLA PNEUMONIAE SUSCEPTIBILITIES PERFORMED ON PREVIOUS CULTURE WITHIN THE LAST 5 DAYS. Performed at [facility], [place] [address] [place], [place] [address]    Report Status 08/22/2018 FINAL  Final  Culture, blood (routine x 2)     Status: Abnormal   Collection Time: 08/19/18  5:23 PM  Result Value Ref Range Status   Specimen Description BLOOD RIGHT ARM  Final   Special Requests   Final    BOTTLES DRAWN AEROBIC AND ANAEROBIC Blood Culture results may not be optimal due to an inadequate volume of blood received in  culture bottles   Culture  Setup Time   Final    GRAM NEGATIVE RODS IN BOTH AEROBIC AND ANAEROBIC BOTTLES CRITICAL RESULT CALLED TO, READ BACK BY AND VERIFIED WITHKarsten Ro [facility] 4081 08/20/18 A BROWNING Performed at [facility], [place] [address], [place], [place] [address]    Culture KLEBSIELLA PNEUMONIAE (A)  Final   Report Status 08/22/2018 FINAL  Final   Organism ID, Bacteria KLEBSIELLA PNEUMONIAE  Final      Susceptibility   Klebsiella pneumoniae - MIC*    AMPICILLIN >=32 RESISTANT Resistant     CEFAZOLIN <=4 SENSITIVE Sensitive     CEFEPIME <=1 SENSITIVE Sensitive     CEFTAZIDIME <=1 SENSITIVE Sensitive     CEFTRIAXONE <=1 SENSITIVE Sensitive     CIPROFLOXACIN <=0.25 SENSITIVE Sensitive     GENTAMICIN <=1 SENSITIVE Sensitive     IMIPENEM <=0.25 SENSITIVE Sensitive     TRIMETH/SULFA <=20 SENSITIVE Sensitive     AMPICILLIN/SULBACTAM 16 INTERMEDIATE Intermediate     PIP/TAZO 16 SENSITIVE Sensitive     Extended ESBL NEGATIVE Sensitive     * KLEBSIELLA PNEUMONIAE  Blood Culture ID Panel (Reflexed)     Status: Abnormal   Collection Time: 08/19/18  5:23 PM  Result Value Ref Range Status   Enterococcus species NOT DETECTED NOT DETECTED Final   Listeria monocytogenes NOT DETECTED NOT DETECTED Final   Staphylococcus species NOT DETECTED NOT DETECTED Final   Staphylococcus aureus (BCID) NOT DETECTED NOT DETECTED Final   Streptococcus species NOT DETECTED NOT DETECTED Final   Streptococcus agalactiae NOT DETECTED NOT DETECTED Final   Streptococcus pneumoniae NOT DETECTED NOT DETECTED Final   Streptococcus pyogenes NOT DETECTED NOT DETECTED Final   Acinetobacter baumannii NOT DETECTED NOT DETECTED Final   Enterobacteriaceae species DETECTED (A) NOT DETECTED Final    Comment: Enterobacteriaceae represent a large family of gram-negative bacteria, not a single organism. CRITICAL RESULT CALLED TO, READ BACK BY AND VERIFIED WITH: [facility] PHARMD 5631 08/20/18 A BROWNING     Enterobacter cloacae complex NOT DETECTED NOT DETECTED Final   Escherichia coli NOT DETECTED NOT DETECTED Final   Klebsiella oxytoca NOT DETECTED NOT DETECTED Final   Klebsiella pneumoniae DETECTED (A) NOT DETECTED Final    Comment: CRITICAL RESULT CALLED TO, READ  BACK BY AND VERIFIED WITHKarsten Ro [facility] 7001 08/20/18 A BROWNING    Proteus species NOT DETECTED NOT DETECTED Final   Serratia marcescens NOT DETECTED NOT DETECTED Final   Carbapenem resistance NOT DETECTED NOT DETECTED Final   Haemophilus influenzae NOT DETECTED NOT DETECTED Final   Neisseria meningitidis NOT DETECTED NOT DETECTED Final   Pseudomonas aeruginosa NOT DETECTED NOT DETECTED Final   Candida albicans NOT DETECTED NOT DETECTED Final   Candida glabrata NOT DETECTED NOT DETECTED Final   Candida krusei NOT DETECTED NOT DETECTED Final   Candida parapsilosis NOT DETECTED NOT DETECTED Final   Candida tropicalis NOT DETECTED NOT DETECTED Final    Comment: Performed at [facility], [place]. [address], [place], [place] [address]  MRSA PCR Screening     Status: None   Collection Time: 08/20/18 12:02 PM  Result Value Ref Range Status   MRSA by PCR NEGATIVE NEGATIVE Final    Comment:        The GeneXpert MRSA Assay (FDA approved for NASAL specimens only), is one component of a comprehensive MRSA colonization surveillance program. It is not intended to diagnose MRSA infection nor to guide or monitor treatment for MRSA infections. Performed at [facility], [place] [address], [place], [place] [address]   C difficile quick scan w PCR reflex     Status: Abnormal   Collection Time: 08/21/18 11:24 AM  Result Value Ref Range Status   C Diff antigen POSITIVE (A) NEGATIVE Final   C Diff toxin NEGATIVE NEGATIVE Final   C Diff interpretation Results are indeterminate. See PCR results.  Final    Comment: Performed at [facility], [place] [address] [place], [place] [address]  C. Diff by PCR, Reflexed     Status:  Abnormal   Collection Time: 08/21/18 11:24 AM  Result Value Ref Range Status   Toxigenic C. Difficile by PCR POSITIVE (A) NEGATIVE Final    Comment: Positive for toxigenic C. difficile with little to no toxin production. Only treat if clinical presentation suggests symptomatic illness. Performed at [facility], [place] [address], [place], [place] [address]       Studies: No results found.  Scheduled Meds: . allopurinol  100 mg Oral Daily  . aspirin  81 mg Oral Daily  . Chlorhexidine Gluconate Cloth  6 each Topical Q0600  . cinacalcet  90 mg Oral BID WC  . citalopram  10 mg Oral QHS  . colesevelam  1,875 mg Oral BID WC  . doxercalciferol  4 mcg Intravenous Q T,Th,Sa-HD  . ezetimibe  10 mg Oral Daily  . famotidine  20 mg Oral Daily  . feeding supplement (NEPRO CARB STEADY)  237 mL Oral BID BM  . ferric citrate  420 mg Oral TID WC  . gabapentin  200 mg Oral TID  . heparin injection (subcutaneous)  5,000 Units Subcutaneous Q8H  . insulin aspart  0-9 Units Subcutaneous TID WC  . methimazole  2.5 mg Oral Daily  . multivitamin  1 tablet Oral Daily  . vancomycin  125 mg Oral Q6H    Continuous Infusions: . cefTRIAXone (ROCEPHIN)  IV 2 g (08/28/18 1957)  . dextrose Stopped (08/25/18 0845)     LOS: 10 days     Kayleen Memos, MD [facility] Pager [phone]  If 7PM-7AM, please contact night-coverage [email or web address] Password [facility] 08/29/2018, 4:35 PM

## 2018-08-29 NOTE — Patient Outreach (Signed)
[facility]) Care Management  08/29/2018  [name] 05-12-1951 [id number]   CSW will perform a case closure on patient, due to patient being hospitalized greater than 10 days.  CSW will notify patient's RNCM with [facility], [name] of CSW's plans to close patient's case.  CSW will fax an update to patient's Primary Care Physician, Dr. [name] to ensure that they are aware of CSW's involvement with patient's plan of care.   Nat Christen, BSW, MSW, LCSW  Licensed Education officer, [facility]  Mailing [place] [address], [place], [place] [address] Physical Address-[address], [place], [place] [address] Toll Free Main # [phone] Fax # [phone] Cell # [phone]  Office # [phone] [email or web address]

## 2018-08-29 NOTE — Evaluation (Signed)
Physical Therapy Evaluation Patient Details Name: [name] MRN: [id number] DOB: 1950-12-05 Today's Date: 08/29/2018   History of Present Illness  [name] is a 67 y.o. male with history of ESRD on hemodialysis on Tuesday Thursday Saturday, diabetes mellitus type 2, hyperthyroidism, anemia, CHF, CAD h/o NSTEMI, ischemic cardiomyopathy, gout was admitted last month for sepsis was brought to the ER after dialysis by patient's wife after patient was found to be weak with fever 102.  Found to have recurrent Klebsiella bacteremia.  Clinical Impression  Patient presents with decreased independence with mobility due to decreased strength, decreased balance, decreased activity tolerance.  He presents at minguard to S level but with significant fall risk due to LE weakness.  Feel can return home with wife assist and has needed equipment at home.  Educated on safety for home using shower seat, walker, supportive footwear, and not getting up if not feeling well.  Patient will benefit from follow up PT in acute setting if not d/c.     Follow Up Recommendations Home health PT;Supervision/Assistance - 24 hour(had HHPT set up through [facility])    Equipment Recommendations  None recommended by PT    Recommendations for Other Services       Precautions / Restrictions Precautions Precautions: Fall      Mobility  Bed Mobility Overal bed mobility: Modified Independent             General bed mobility comments: increased time  Transfers Overall transfer level: Needs assistance Equipment used: Rolling walker (2 wheeled) Transfers: Sit to/from Stand Sit to Stand: Supervision         General transfer comment: heavy UE use and increased time  Ambulation/Gait Ambulation/Gait assistance: Min guard;Min assist Gait Distance (Feet): 90 Feet Assistive device: Rolling walker (2 wheeled) Gait Pattern/deviations: Step-through pattern;Decreased stride length;Shuffle;Trunk flexed     General Gait Details: couple eposides of knees buckling (?asterixis) with self recovery, min a given for safety  Stairs            Wheelchair Mobility    Modified Rankin (Stroke Patients Only)       Balance Overall balance assessment: Needs assistance   Sitting balance-Leahy Scale: Good     Standing balance support: Bilateral upper extremity supported Standing balance-Leahy Scale: Poor Standing balance comment: needs UE support for balance                              Pertinent Vitals/Pain Pain Assessment: Faces Faces Pain Scale: Hurts a little bit Pain Location: L Wrist Pain Descriptors / Indicators: Sore Pain Intervention(s): Monitored during session;Repositioned    Home Living Family/patient expects to be discharged to:: Private residence Living Arrangements: Spouse/significant other;Children Available Help at Discharge: Family;Available 24 hours/day Type of Home: House Home Access: Stairs to enter Entrance Stairs-Rails: Right Entrance Stairs-Number of Steps: 3 Home Layout: One level Home Equipment: Walker - 2 wheels;Cane - single point;Shower seat;Bedside commode;Walker - 4 wheels      Prior Function Level of Independence: Independent with assistive device(s)         Comments: Used RW for ambulation. Reports he does not do alot on his dialysis days and mostly sits in recliner.      Hand Dominance   Dominant Hand: Left    Extremity/Trunk Assessment   Upper Extremity Assessment Upper Extremity Assessment: LUE deficits/detail LUE Deficits / Details: limited shoulder elevation about 80 degrees with weakness per pt (3-/5 shoulder elevation),  elbow flexion 4/5,     Lower Extremity Assessment Lower Extremity Assessment: RLE deficits/detail;LLE deficits/detail RLE Deficits / Details: AROM WFL, strength hip flexion 4/5, knee extension 4-/5, ankle DF 4-/5 LLE Deficits / Details: AROM WFL, strength hip flexion 4/5, knee extension 4-/5,  ankle DF 4-/5       Communication   Communication: No difficulties  Cognition Arousal/Alertness: Awake/alert Behavior During Therapy: WFL for tasks assessed/performed Overall Cognitive Status: Within Functional Limits for tasks assessed                                        General Comments General comments (skin integrity, edema, etc.): wife in room and supportive; appropriate questions about antibiotic admistration after d/c, deferred to MD    Exercises     Assessment/Plan    PT Assessment Patient needs continued PT services  PT Problem List Decreased strength;Decreased mobility;Decreased balance;Decreased knowledge of use of DME;Decreased activity tolerance;Decreased safety awareness       PT Treatment Interventions DME instruction;Therapeutic activities;Therapeutic exercise;Gait training;Patient/family education;Stair training;Functional mobility training    PT Goals (Current goals can be found in the Care Plan section)  Acute Rehab PT Goals Patient Stated Goal: to go home, continue to treat infection PT Goal Formulation: With patient/family Time For Goal Achievement: 09/05/18 Potential to Achieve Goals: Good    Frequency Min 3X/week   Barriers to discharge        Co-evaluation               AM-PAC PT "6 Clicks" Mobility  Outcome Measure Help needed turning from your back to your side while in a flat bed without using bedrails?: A Little Help needed moving from lying on your back to sitting on the side of a flat bed without using bedrails?: A Little Help needed moving to and from a bed to a chair (including a wheelchair)?: A Little Help needed standing up from a chair using your arms (e.g., wheelchair or bedside chair)?: A Little Help needed to walk in hospital room?: A Little Help needed climbing 3-5 steps with a railing? : A Lot 6 Click Score: 17    End of Session Equipment Utilized During Treatment: Gait belt Activity Tolerance:  Patient limited by fatigue Patient left: in bed;with call bell/phone within reach   PT Visit Diagnosis: Muscle weakness (generalized) (M62.81);Other abnormalities of gait and mobility (R26.89)    Time: 5035-4656 PT Time Calculation (min) (ACUTE ONLY): 26 min   Charges:   PT Evaluation $PT Eval Moderate Complexity: 1 Mod PT Treatments $Gait Training: 8-22 mins        Magda Kiel, [place] [facility] [phone] 08/29/2018   [name] 08/29/2018, 6:07 PM

## 2018-08-29 NOTE — Progress Notes (Addendum)
[facility] Progress Note   Dialysis Orders: SW TTS 4h 98.5kg 2/2.25 bath P2 [name] 4 no ESA/FeHep none  Assessment/Plan: 1.Klebsiella sepsis- recurrent bacteremia - source unclear - on IV Rocephin -ID rec 2 weeks of Fortaz counting 11/21 as day 1 TEE neg for endocarditis. TMax 99.1 Consider GI eval for source of recurrent GNR sepsis, hx of rectal ulcers/ radiation proctitis (hx prostate cancer) in the past 2. ESRD- TTS K4.3 , required short term CRRT 11/20 - 11/22 - next HD Saturday- HD orders written in case he is not d/c 3. Anemia- hgb 10.7- normally in the 12s - if < 10 start ESA 4. Secondary hyperparathyroidism- resumed [name] 4 - on sensipar but no binder P 5.7 - resumed Aurixya 5.HTN/volume- bed weights above EDW- get standing wt at next HD 6. Nutrition- renal carb mod diet - has renavites and nepro supplements- said he will drink 7. Left wrist pain - xray showed "Probable old scaphoid fracture with deformity of the distal aspect of this bone. Cystic changes within multiple carpal bones as well as the distal ulna may reflect gout. Degenerative narrowing of the first [facility] joint." On allopurinol. Never had toradol.- given one dose colchicine Wed -overall improved 8. C diff - oral Vanc through 12/15 - contact precautions after d/c  9. OSA - CPAP at night 10. Disp - ok from renal perspective for d/c- we can give Fortaz at outpatient HD unit - if d/c Saturday he can to go to his home HD unit for discharge.  Myriam Jacobson, PA-C [facility] Beeper [phone] 08/29/2018,10:59 AM  LOS: 10 days   Pt seen, examined, agree w assess/plan as above with additions as indicated.  Kelly Splinter MD [facility] pager [phone]    cell [phone] 08/29/2018, 2:14 PM     Subjective:   Stools better.  Slept so - so. Napping now without CPAP.  Wrist pain much improved  Objective Vitals:   08/28/18 1102 08/28/18 1642 08/28/18  2046 08/29/18 0437  BP: (!) 112/53 122/60 107/62 137/70  Pulse: 81 85 88 79  Resp: 18 16 20 18   Temp: 99.1 F (37.3 C) 98.7 F (37.1 C) 98 F (36.7 C) 98.2 F (36.8 C)  TempSrc: Oral Oral Oral Oral  SpO2: 94% 93% 97% 96%  Weight:      Height:       Physical Exam General: NAD supine in bed Heart: RRR Lungs: no rales Abdomen: obese soft NT Extremities: no LE edema Dialysis Access:  Right upper AVF + bruit   Additional Objective Labs: Basic Metabolic Panel: Recent Labs  Lab 08/26/18 0625 08/26/18 0828 08/27/18 0529 08/29/18 0710  NA  --  136 137 139  K  --  4.1 3.7 4.3  CL  --  102 101 105  CO2  --  21* 24 26  GLUCOSE  --  100* 112* 152*  BUN  --  52* 26* 30*  CREATININE  --  7.38* 5.42* 7.13*  CALCIUM  --  8.2* 8.3* 9.1  PHOS 7.5* 7.5* 5.7*  --    Liver Function Tests: Recent Labs  Lab 08/24/18 0615 08/26/18 0828 08/27/18 0529  ALBUMIN 2.0* 2.1* 2.2*   No results for input(s): LIPASE, AMYLASE in the last 168 hours. CBC: Recent Labs  Lab 08/24/18 0549 08/25/18 0548 08/26/18 0625 08/27/18 0529 08/29/18 0710  WBC 9.2 10.1 7.7 5.8 5.5  NEUTROABS 6.0 5.8 4.0 2.9 3.0  HGB 10.9* 10.4* 10.5* 10.4* 10.7*  HCT 36.2*  32.8* 33.5* 33.1* 35.7*  MCV 95.5 92.7 92.5 93.0 96.0  PLT 133* 152 188 203 276   Blood Culture    Component Value Date/Time   SDES BLOOD RIGHT ARM 08/19/2018 1723   SPECREQUEST  08/19/2018 1723    BOTTLES DRAWN AEROBIC AND ANAEROBIC Blood Culture results may not be optimal due to an inadequate volume of blood received in culture bottles   CULT KLEBSIELLA PNEUMONIAE (A) 08/19/2018 1723   REPTSTATUS 08/22/2018 FINAL 08/19/2018 1723    Cardiac Enzymes: No results for input(s): CKTOTAL, CKMB, CKMBINDEX, TROPONINI in the last 168 hours. CBG: Recent Labs  Lab 08/27/18 2102 08/28/18 0748 08/28/18 1130 08/28/18 1644 08/29/18 0744  GLUCAP 125* 133* 290* 121* 136*   Iron Studies: No results for input(s): IRON, TIBC, TRANSFERRIN, FERRITIN  in the last 72 hours. Lab Results  Component Value Date   INR 1.15 08/19/2018   INR 1.12 07/16/2018   INR 1.09 07/16/2018   Studies/Results: Dg Wrist 2 Views Left  Result Date: 08/27/2018 CLINICAL DATA:  Painful mildly swollen left wrist.  No known injury. EXAM: LEFT WRIST - 2 VIEW COMPARISON:  Left wrist series of Feb 20, 2007 FINDINGS: The bones are subjectively adequately mineralized. There is deformity of the scaphoid consistent with a previous fracture through the junction of the middle and distal thirds of this bone. There are cystic changes within the distal ulna, waist of the scaphoid, capitate, hamate, and trapezoid. The joint spaces are reasonably maintained with the exception of the first [facility] joint where there is mild narrowing. IMPRESSION: Probable old scaphoid fracture with deformity of the distal aspect of this bone. Cystic changes within multiple carpal bones as well as the distal ulna may reflect gout. Degenerative narrowing of the first [facility] joint. Electronically Signed   By: David  Martinique M.D.   On: 08/27/2018 16:34   Medications: . cefTRIAXone (ROCEPHIN)  IV 2 g (08/28/18 1957)  . dextrose Stopped (08/25/18 0845)   . allopurinol  100 mg Oral Daily  . aspirin  81 mg Oral Daily  . Chlorhexidine Gluconate Cloth  6 each Topical Q0600  . Chlorhexidine Gluconate Cloth  6 each Topical Q0600  . cinacalcet  90 mg Oral BID WC  . citalopram  10 mg Oral QHS  . colesevelam  1,875 mg Oral BID WC  . doxercalciferol  4 mcg Intravenous Q T,Th,Sa-HD  . ezetimibe  10 mg Oral Daily  . famotidine  20 mg Oral Daily  . feeding supplement (NEPRO CARB STEADY)  237 mL Oral BID BM  . ferric citrate  420 mg Oral TID WC  . gabapentin  200 mg Oral TID  . heparin injection (subcutaneous)  5,000 Units Subcutaneous Q8H  . insulin aspart  0-9 Units Subcutaneous TID WC  . methimazole  2.5 mg Oral Daily  . multivitamin  1 tablet Oral Daily  . vancomycin  125 mg Oral Q6H

## 2018-08-29 NOTE — Progress Notes (Signed)
O2 humidity provided. Patient states will placed self on NIV once he is ready for bed.

## 2018-08-30 LAB — RENAL FUNCTION PANEL
Albumin: 2.3 g/dL — ABNORMAL LOW (ref 3.5–5.0)
Anion gap: 14 (ref 5–15)
BUN: 46 mg/dL — ABNORMAL HIGH (ref 8–23)
CO2: 23 mmol/L (ref 22–32)
Calcium: 9 mg/dL (ref 8.9–10.3)
Chloride: 102 mmol/L (ref 98–111)
Creatinine, Ser: 9.53 mg/dL — ABNORMAL HIGH (ref 0.61–1.24)
GFR calc Af Amer: 6 mL/min — ABNORMAL LOW (ref >60)
GFR calc non Af Amer: 5 mL/min — ABNORMAL LOW (ref >60)
Glucose, Bld: 154 mg/dL — ABNORMAL HIGH (ref 70–99)
POTASSIUM: 4.3 mmol/L (ref 3.5–5.1)
Phosphorus: 4.4 mg/dL (ref 2.5–4.6)
Sodium: 139 mmol/L (ref 135–145)

## 2018-08-30 LAB — CBC
HCT: 31.3 % — ABNORMAL LOW (ref 39.0–52.0)
Hemoglobin: 9.5 g/dL — ABNORMAL LOW (ref 13.0–17.0)
MCH: 29.6 pg (ref 26.0–34.0)
MCHC: 30.4 g/dL (ref 30.0–36.0)
MCV: 97.5 fL (ref 80.0–100.0)
Platelets: 266 10*3/uL (ref 150–400)
RBC: 3.21 MIL/uL — AB (ref 4.22–5.81)
RDW: 17.9 % — ABNORMAL HIGH (ref 11.5–15.5)
WBC: 6.2 10*3/uL (ref 4.0–10.5)
nRBC: 0 % (ref 0.0–0.2)

## 2018-08-30 LAB — GLUCOSE, CAPILLARY
Glucose-Capillary: 170 mg/dL — ABNORMAL HIGH (ref 70–99)
Glucose-Capillary: 144 mg/dL — ABNORMAL HIGH (ref 70–99)
Glucose-Capillary: 116 mg/dL — ABNORMAL HIGH (ref 70–99)
Glucose-Capillary: 115 mg/dL — ABNORMAL HIGH (ref 70–99)

## 2018-08-30 MED ORDER — FAMOTIDINE 20 MG PO TABS: 20.0000 mg | ORAL_TABLET | Freq: Every day | ORAL | 0 refills | Status: DC

## 2018-08-30 MED ORDER — CEFTAZIDIME IV (FOR PTA / DISCHARGE USE ONLY): 2.0000 g | INTRAVENOUS | 0 refills | Status: DC

## 2018-08-30 MED ORDER — DARBEPOETIN ALFA 25 MCG/0.42ML IJ SOSY
PREFILLED_SYRINGE | INTRAMUSCULAR | Status: AC
  Filled 2018-08-30: qty 0.42

## 2018-08-30 MED ORDER — NEPRO/CARBSTEADY PO LIQD: 237.0000 mL | Freq: Two times a day (BID) | ORAL | 0 refills | Status: DC

## 2018-08-30 MED ORDER — SODIUM CHLORIDE 0.9 % IV SOLN: 2.0000 g | INTRAVENOUS | 0 refills | Status: DC

## 2018-08-30 MED ORDER — DARBEPOETIN ALFA 25 MCG/0.42ML IJ SOSY
25.0000 ug | PREFILLED_SYRINGE | INTRAMUSCULAR | Status: DC
  Administered 2018-08-30: 25 ug via INTRAVENOUS
  Filled 2018-08-30: qty 0.42

## 2018-08-30 MED ORDER — DOXERCALCIFEROL 4 MCG/2ML IV SOLN
INTRAVENOUS | Status: AC
  Administered 2018-08-30: 4 ug via INTRAVENOUS
  Filled 2018-08-30: qty 2

## 2018-08-30 MED ORDER — VANCOMYCIN 50 MG/ML ORAL SOLUTION: 125.0000 mg | Freq: Four times a day (QID) | ORAL | 0 refills | Status: DC

## 2018-08-30 MED ORDER — GABAPENTIN 100 MG PO CAPS: 200.0000 mg | ORAL_CAPSULE | Freq: Three times a day (TID) | ORAL | 0 refills | Status: DC

## 2018-08-30 MED ORDER — FERRIC CITRATE 1 GM 210 MG(FE) PO TABS: 420.0000 mg | ORAL_TABLET | Freq: Three times a day (TID) | ORAL | 0 refills | Status: DC

## 2018-08-30 NOTE — Discharge Instructions (Signed)
Bacteremia °Bacteremia is the presence of bacteria in the blood. When bacteria enter the bloodstream, they can cause a life-threatening reaction called sepsis, which is a medical emergency. Bacteremia can spread to other parts of the body, including the heart, joints, and brain. °What are the causes? °This condition is caused by bacteria that get into the blood. Bacteria can enter the blood: °· From a skin infection or a cut on your skin. °· During an episode of pneumonia. °· From an infection in your stomach or intestine (gastrointestinal infection). °· From an infection in your bladder or urinary system (urinary tract infection). °· During a dental or medical procedure. °· After you brush your teeth so hard that your gums bleed. °· When a bacterial infection in another part of the body spreads to the blood. °· Through a dirty needle. ° °What increases the risk? °This condition is more likely to develop in: °· Children. °· Elderly adults. °· People who have a long-lasting (chronic) disease or medical condition. °· People who have an artificial joint or heart valve. °· People who have heart valve disease. °· People who have a tube, such as a catheter or IV tube, that has been inserted for a medical treatment. °· People who have a weak body defense system (immune system). °· People who use IV drugs. ° °What are the signs or symptoms? °Symptoms of this condition include: °· Fever. °· Chills. °· A racing heart. °· Shortness of breath. °· Dizziness. °· Weakness. °· Confusion. °· Nausea or vomiting. °· Diarrhea. ° °In some cases, there are no symptoms. Bacteremia that has spread to the other parts of the body may cause symptoms in those areas. °How is this diagnosed? °This condition may be diagnosed with a physical exam and tests, such as: °· A complete blood count (CBC). This test looks for signs of infection. °· Blood cultures. These look for bacteria in your blood. °· Tests of any tubes that you may have inserted into  your body, such as an IV tube or urinary catheter. These tests look for a source of infection. °· Urine tests including urine cultures. These look for bacteria in the urine that could be a source of infection. °· Imaging tests, such as an X-ray, CT scan, MRI, or heart ultrasound. These look for a source of infection in other parts of the body, such as the lungs, heart valves, or joints. ° °How is this treated? °This condition may be treated with: °· Antibiotic medicines given through an IV infusion. Depending on the source of infection, antibiotics may be needed for several weeks. At first, an antibiotic may be given to kill most types of blood bacteria. If your test results show that a certain kind of bacteria is causing problems, the antibiotic may be changed to kill only the bacteria that are causing problems. °· Antibiotics taken by mouth. °· IV fluids to support the body as you fight the infection. °· Removing any catheter or device that could be a source of infection. °· Blood pressure and breathing support, if you have sepsis. °· Surgery to control the source or spread of infection, such as: °? Removing an infected implanted device. °? Removing infected tissue or an abscess. ° °This condition is usually treated at a hospital. If you are treated at home, you may need to come back for medicines, blood tests, and evaluation. This is important. °Follow these instructions at home: °· Take over-the-counter and prescription medicines only as told by your health care provider. °·   If you were prescribed an antibiotic, take it as told by your health care provider. Do not stop taking the antibiotic even if you start to feel better.  Rest until your condition is under control.  Drink enough fluid to keep your urine clear or pale yellow.  Do not smoke. If you need help quitting, ask your health care provider.  Keep all follow-up visits as told by your health care provider. This is important. How is this  prevented?  Get the vaccinations that your health care provider recommends.  Clean and cover any scrapes or cuts.  Take good care of your skin. This includes regular bathing and moisturizing.  Wash your hands often.  Practice good oral hygiene. Brush your teeth two times a day and floss regularly. Get help right away if:  You have pain.  You have a fever.  You have trouble breathing.  Your skin becomes blotchy, pale, or clammy.  You develop confusion, dizziness, or weakness.  You develop diarrhea.  You develop any new symptoms after treatment. Summary  Bacteremia is the presence of bacteria in the blood. When bacteria enter the bloodstream, they can cause a life- threatening reaction called sepsis.  Children and elderly adults are at increased risk of bacteremia. Other risk factors include having a long-lasting (chronic) disease or a weak immune system, having an artificial joint or heart valve, having heart valve disease, having tubes that were inserted in the body for medical treatment, or using IV drugs.  Some symptoms of bacteremia include fever, chills, shortness of breath, confusion, nausea or vomiting, and diarrhea.  Tests may be done to diagnose a source of infection that led to bacteremia. These tests may include blood tests, urine tests, and imaging tests.  Bacteremia is usually treated with antibiotics, usually in a hospital. This information is not intended to replace advice given to you by your health care provider. Make sure you discuss any questions you have with your health care provider. Document Released: 07/01/2006 Document Revised: 08/14/2016 Document Reviewed: 08/14/2016 Elsevier Interactive Patient Education  2018 [employer].   Dialysis Diet Dialysis is a treatment that cleans your blood. It is used when the kidneys are damaged. When you need dialysis, you should watch your diet. This is because some nutrients can build up in your blood between  treatments and make you sick. These nutrients are:  Potassium.  Phosphorus.  Sodium.  Your doctor or dietitian will:  Tell you how much of these you can have.  Tell you if you need to look out for other nutrients too.  Help you plan meals.  Tell you how much to drink each day.  What do I need to know about this diet?  Limit potassium. Potassium is in milk, fruits, and vegetables.  Limit phosphorus. Phosphorus is in milk, cheese, beans, nuts, and carbonated beverages.  Limit salt (sodium). Foods that have a lot sodium include processed and cured meats, ready-made frozen meals, canned vegetables, and salty snack foods.  Do not use salt substitutes.  Try not to eat whole-grain foods and foods that have a lot of fiber.  Follow your doctor's instructions about how much to drink. You may be told to: ? Write down what you drink. ? Write down foods you eat that are made mostly from water, such as gelatin and soups. ? Drink from small cups.  Ask your doctor if you should take a medicine that binds phosphorus.  Take vitamin and mineral supplements only as told by your doctor.  Eat meat, poultry, fish, and eggs. Limit nuts and beans.  Before you cook potatoes, cut them into small pieces. Then boil them in unsalted water.  Drain all fluid from cooked vegetables and canned fruits before you eat them. What foods can I eat? Grains White bread. White rice. Cooked cereal. Unsalted popcorn. Tortillas. Pasta. Vegetables Fresh or frozen broccoli, carrots, and Obst beans. Cabbage. Cauliflower. Celery. Cucumbers. Eggplant. Radishes. Zucchini. Fruits Apples. Fresh or frozen berries. Fresh or canned pears, peaches, and pineapple. Grapes. Plums. Meats and Other Protein Sources Fresh or frozen beef, pork, chicken, and fish. Eggs. Low-sodium canned tuna or salmon. Dairy Cream cheese. Heavy cream. Ricotta cheese. Beverages Apple cider. Cranberry juice. Grape juice. Lemonade. Black  coffee. Condiments Herbs. Spices. Jam and jelly. Honey. Sweets and Desserts Sherbet. Cakes. Cookies. Fats and Oils Olive oil, canola oil, and safflower oil. Other Non-dairy creamer. Non-dairy whipped topping. Homemade broth without salt. The items listed above may not be a complete list of recommended foods or beverages. Contact your dietitian for more options. What foods are not recommended? Grains Whole-grain bread. Whole-grain pasta. High-fiber cereal. Vegetables Potatoes. Beets. Tomatoes. Winter squash and pumpkin. Asparagus. Spinach. Parsnips. Fruits Star fruit. Bananas. Oranges. Kiwi. Nectarines. Prunes. Melon. Dried fruit. Avocado. Meats and Other Protein Sources Canned, smoked, and cured meats. Soil scientist. Sardines. Nuts and seeds. Peanut butter. Beans and legumes. Dairy Milk. Buttermilk. Yogurt. Cheese and cottage cheese. Processed cheese spreads. Beverages Orange juice. Prune juice. Carbonated soft drinks. Condiments Salt. Salt substitutes. Soy sauce. Sweets and Desserts Ice cream. Chocolate. Candied nuts. Fats and Oils Butter. Margarine. Other Ready-made frozen meals. Canned soups. The items listed above may not be a complete list of foods and beverages to avoid. Contact your dietitian for more information. This information is not intended to replace advice given to you by your health care provider. Make sure you discuss any questions you have with your health care provider. Document Released: 03/18/2012 Document Revised: 02/23/2016 Document Reviewed: 04/20/2014 Elsevier Interactive Patient Education  2018 [place] Kidney Disease End-stage kidney disease occurs when the kidneys are so damaged that they cannot do their job. The kidneys are two organs that do many important jobs in the body, which include:  Removing wastes and extra fluids from the blood.  Making hormones that maintain the amount of fluid in your tissues and blood  vessels.  Maintaining the right amount of fluids and chemicals in the body.  When the kidneys are damaged and cannot do their job, life-threatening problems occur. Without the help of the kidneys, toxins build up in the blood. In end-stage kidney disease, the kidneys cannot get better. What are the causes? End-stage kidney disease usually occurs when a long-lasting (chronic) kidney disease gets worse. It may also occur after the kidneys are suddenly damaged (acute kidney injury). What increases the risk? This condition is more likely to develop in people who are:  Older than age 88.  Male.  Of African-American descent.  Current smokers or former smokers.  Obese.  You may also have an increased risk for end-stage kidney disease if you:  Have a family history of chronic kidney disease (CKD).  Have had kidney disease for many years.  Have other longstanding medical conditions that affect the kidneys, such as: ? Cardiovascular disease including high blood pressure. ? Diabetes. ? Certain diseases that affect the immune system.  What are the signs or symptoms?  Swelling (edema) of the face, legs, ankles, or feet.  Numbness, tingling,  or loss of feeling (sensation) in your hands or feet.  Tiredness (lethargy).  Nausea or vomiting.  Confusion, trouble concentrating, or loss of consciousness.  Chest pain.  Shortness of breath.  Little to no urine production.  Muscle twitches and cramps, especially in the legs.  Constant itchiness.  Loss of appetite.  Pale skin and tissue lining your eyelids (conjunctiva).  Headaches.  Abnormally dark or light skin.  Decrease in muscle size (muscle wasting).  Easy bruising.  Frequent hiccups.  Stopping of menstruation in women.  Seizures. How is this diagnosed? Your health care provider will measure your blood pressure and do some tests. These may include:  Urine tests.  Blood tests.  Imaging tests.  A test in  which a sample of tissue is removed from the kidneys to be looked at under a microscope (kidney biopsy).  How is this treated? There are two treatments for end-stage kidney disease:  A procedure that removes toxic wastes from the body (dialysis). Depending on the type of dialysis you choose, it may be performed more than one time a day (peritoneal dialysis) or several times a week (hemodialysis).  Surgery toreceive a new kidney (kidney transplant).  In addition to having dialysis or a kidney transplant, you may need to take medicines:  To control high blood pressure (hypertension).  To control cholesterol.  To maintain healthy electrolyte levels in your blood.  You may also be given a specific diet to follow that includes requirements or limits for:  Salt (sodium).  Protein.  Phosphorous.  Potassium.  Calcium.  Follow these instructions at home:  Follow your prescribed diet.  Take over-the-counter and prescription medicines only as told by your health care provider. ? Do not take any new medicines unless approved by your health care provider. Many medicines can worsen your kidney damage. ? Do not take any vitamin and mineral supplements unless approved by your health care provider. Many nutritional supplements can worsen your kidney damage. ? The dose of some medicines that you take may need to be adjusted.  Do not use any tobacco products, such as cigarettes, chewing tobacco, and e-cigarettes. If you need help quitting, ask your health care provider.  Keep all follow-up visits as told by your health care provider. This is important.  Keep track of your blood pressure. Report changes in your blood pressure as told by your health care provider.  Achieve and maintain a healthy weight. If you need help with this, ask your health care provider.  Start or continue an exercise plan. Try to exercise at least 30 minutes a day, 5 days a week.  Stay current with immunizations  as told by your health care provider. Where to find more information:  American Association of Kidney Patients: BombTimer.gl  National Kidney Foundation: www.kidney.[place]: https://mathis.com/  [facility]: www.lifeoptions.org and www.kidneyschool.org Contact a health care provider if:  Your symptoms get worse.  You develop new symptoms. Get help right away if:  You have weakness in an arm or leg on one side of your body.  You have difficulty speaking or you are slurring your speech.  You have a sudden change in your vision.  You have a sudden, severe headache.  You have a sudden weight increase.  You have difficulty breathing.  Your symptoms suddenly get worse. This information is not intended to replace advice given to you by your health care provider. Make sure you discuss any questions you have with your health care  provider. Document Released: 12/08/2003 Document Revised: 02/23/2016 Document Reviewed: 05/16/2012 Elsevier Interactive Patient Education  2017 [employer].   Hemodialysis Hemodialysis is a way of removing wastes, salt, and extra water from your blood. It is done when your kidneys cannot keep the blood clean. During hemodialysis, your blood travels outside of your body to a machine. A filter in the machine cleans the blood. Hemodialysis is usually done 3 times a week. Visits last 3-5 hours. What happens before the procedure? An opening must be made to allow blood to be taken from your body and put back into your body (access). It is made weeks or months before you start hemodialysis. It is usually made in your arm. If you need hemodialysis right away, a thin, flexible tube (catheter) will be placed in your neck, chest, or groin. This type of access is usually temporary. What happens during the procedure? Hemodialysis is done while you are sitting or leaning back. During hemodialysis you may do any activity as long as you  stay sitting or leaning back. Many people sleep, watch television, or read. If you have side effects or feel uncomfortable, tell your doctor. Your doctor may make changes to help you feel more comfortable. Your hemodialysis visits may look like this: 1. You will be weighed and your temperature will be taken. Your blood pressure and pulse will be checked. 2. The skin around your access will be cleaned. 3. Two needles will be put into the access. They will be connected to a plastic tube. The needles will be taped to your skin so that they do not move. If you have a temporary access, your catheter will be connected to a plastic tube. 4. Your blood will go through the tube to the machine. The machine will clean your blood. Then your blood will go back to your body. Your blood pressure and pulse will be checked a few times. 5. Once the procedure is complete, the needles will be removed. A bandage (dressing) will be placed over the access. If your access is a catheter, it will be disconnected.  What happens after the procedure?  You will be weighed.  Your blood will be tested. This is usually done once a month.  You may have side effects. These include: ? Dizziness. ? Muscle cramps. ? Feeling sick to your stomach (nausea). ? Headaches. ? Feeling tired. You usually feel normal the next day. ? Itchiness. Your doctor may give medicine to help with this. ? Achy or jittery legs. You may feel like kicking your legs. This can cause sleeping problems. ? Allergic reaction. This information is not intended to replace advice given to you by your health care provider. Make sure you discuss any questions you have with your health care provider. Document Released: 08/30/2008 Document Revised: 02/23/2016 Document Reviewed: 11/03/2012 Elsevier Interactive Patient Education  2017 [employer].

## 2018-08-30 NOTE — Progress Notes (Signed)
UPDATE: Discharge delayed due to patient's wife stating that she would not be able to afford his po vancomycin. Case manager consulted to assist with medications.  Will discharge in the morning once medications assistance has been arranged.

## 2018-08-30 NOTE — Discharge Summary (Addendum)
Discharge Summary  [name] XVQ:[id number] DOB: [birth year]  PCP: Biagio Borg, MD  Admit date: 08/19/2018 Discharge date: 08/30/2018  Time spent: 35 minutes  Recommendations for Outpatient Follow-up:  1. Follow-up with nephrology 2. Follow-up with your PCP 3. Follow-up with infectious disease 4. Follow-up with GI for possible repeat colonoscopy.  Please call for an appointment 5. Take your medications as prescribed 6. Continue hemodialysis as scheduled 7. Contact precaution at home due to C. difficile  Discharge Diagnoses:  Active Hospital Problems   Diagnosis Date Noted  . Sepsis ([name]) 07/16/2018  . Altered mental status   . Gram-negative bacteremia   . Chronic combined systolic (congestive) and diastolic (congestive) heart failure ([name]) 01/15/2018  . Chronic GI bleeding 04/22/2017  . Status post coronary artery stent placement   . PAD (peripheral artery disease) ([place]) 06/18/2016  . DM (diabetes mellitus) type II controlled with renal manifestation ([name]) 10/31/2015  . Acute encephalopathy 08/09/2015  . Hyperthyroidism 03/11/2015  . ESRD on hemodialysis ([name]) 08/16/2014  . Encounter for hemodialysis ([name]) 08/16/2014  . OSA (obstructive sleep apnea) 10/16/2011  . Ischemic cardiomyopathy 06/16/2011    Resolved Hospital Problems  No resolved problems to display.    Discharge Condition: Stable  Diet recommendation: Resume previous diet renal hemodialysis diet  Vitals:   08/30/18 1500 08/30/18 1530  BP: 140/76 (!) 115/56  Pulse: 73 (!) 59  Resp:    Temp:    SpO2:      History of present illness:  67 yo male was at outpt HD and developed severe abdominal pain with fever 104F and hypotension. Initially admitted to the ICU for septic shock requiring pressors.Found to have recurrent Klebsiella bacteremia. No clear source. CT abd pelvis revealed colonic diverticula.  Patient has a history of rectal ulcers/radiation proctitis from history of prostate cancer which  is questioned to be a source of his infection.  Recommended outpatient follow-up with [facility].  Patient states he has had a colonoscopy 2 or 3 years ago and at this time he is not interested in having another one.  Patient advised to follow-up with GI.  Also positive c-diff and treated with oral vancomycin. Started to have formed stools on 08/28/18.  Hospital course complicated by initially ICU delirium which has resolved, left wrist swelling with high suspicion for gout. Improved with colchicine.  08/30/2018: Patient seen and examined his bedside.  No acute events overnight.  Self reports he had 2 formed stools yesterday.  Denies abdominal pain or nausea.  He is afebrile with no leukocytosis.  Will have hemodialysis session done today 08/30/2018.  On the day of discharge, the patient was hemodynamically stable.  He will need to follow-up with nephrology, his PCP, take his medications as prescribed and continue his scheduled hemodialysis sessions.   Hospital Course:  Principal Problem:   Sepsis ([name]) Active Problems:   Ischemic cardiomyopathy   OSA (obstructive sleep apnea)   Hyperthyroidism   Acute encephalopathy   DM (diabetes mellitus) type II controlled with renal manifestation ([name])   PAD (peripheral artery disease) ([name])   Status post coronary artery stent placement   ESRD on hemodialysis ([name])   Encounter for hemodialysis ([name])   Chronic GI bleeding   Chronic combined systolic (congestive) and diastolic (congestive) heart failure ([name])   Altered mental status   Gram-negative bacteremia   Resolving septic shock 2/2 Recurrent klebsiella bacteremia Sepsis physiology is resolving Suspect GI source.  May consider repeat colonoscopy. CT abdomen pelvis done on 08/20/2018 revealed no  obvious source for recurrent bacteremia. Patient has a history of rectal ulcers/radiation proctitis from history of prostate cancer Will need to follow-up with his gastroenterologist outpatient.  Has  seen [facility] in the past Positive blood cx from 08/19/18 Repeat blood cultures x2 ID following and recommends 2 weeks of IV ceftaz post hemodialysis sessions. TEE unremarkable for any acute findings.  Negative for endocarditis. 08/21/18 as day 1 of 14 through 09/04/2018. Continue IV ceftaz as recommended by ID post hemodialysis  Left wrist acute gout flare, improving Continue allopurinol Given 1 dose of colchicine with some improvement Repeat dose of colchicine up to 2-3 times a week  C-diff infection, improving Continue po vancomycin 125 mg qid for additional 10 days after IV abx has need per ID through 09/14/2018. Follow up with ID oupt Started formed stool 08/28/2018 with decrease of stool frequency  ESRD on HD on Tuesday Thursday Saturday Last HD on 08/30/2018 Keep your hemodialysis appointments  OSA cpap qhs  Hx of gout with suspected gout flare to left wrist 1 dose 0.6 mg colchicine given once Can repeat dose of colchicine up to 2-3 times per week  DM with peripheral neuropathy. Plan - SSI - resume gabapentin, celexa  Hx of hyperthyroidism. Plan -Continue methimazole  Physical debility PT assessed and recommended home health PT Continue physical therapy at home Fall precautions   Code Status: Full code     Discharge Exam: BP (!) 115/56   Pulse (!) 59   Temp 97.6 F (36.4 C) (Oral)   Resp 19   Ht 6' (1.829 m)   Wt 100.8 kg   SpO2 93%   BMI 30.14 kg/m  . General: 67 y.o. year-old male well developed well nourished in no acute distress.  Alert and oriented x3. . Cardiovascular: Regular rate and rhythm with no rubs or gallops.  No thyromegaly or JVD noted.   [name] Respiratory: Clear to auscultation with no wheezes or rales. Good inspiratory effort. . Abdomen: Soft nontender nondistended with normal bowel sounds x4 quadrants. . Musculoskeletal: No lower extremity edema. 2/4 pulses in all 4 extremities. . Skin: No ulcerative lesions noted or  rashes, . Psychiatry: Mood is appropriate for condition and setting  Discharge Instructions You were cared for by a hospitalist during your hospital stay. If you have any questions about your discharge medications or the care you received while you were in the hospital after you are discharged, you can call the unit and asked to speak with the hospitalist on call if the hospitalist that took care of you is not available. Once you are discharged, your primary care physician will handle any further medical issues. Please note that NO REFILLS for any discharge medications will be authorized once you are discharged, as it is imperative that you return to your primary care physician (or establish a relationship with a primary care physician if you do not have one) for your aftercare needs so that they can reassess your need for medications and monitor your lab values.  Discharge Instructions    Home infusion instructions [facility] May follow [place] Dosing Protocol; May administer Cathflo as needed to maintain patency of vascular access device.; Flushing of vascular access device: per [facility] Protocol: 0.9% NaCl pre/post medica...   Complete by:  As directed    Instructions:  May follow [place] Dosing Protocol   Instructions:  May administer Cathflo as needed to maintain patency of vascular access device.   Instructions:  Flushing of vascular access device:  per [facility] Protocol: 0.9% NaCl pre/post medication administration and prn patency; Heparin 100 u/ml, 97m for implanted ports and Heparin 10u/ml, 547mfor all other central venous catheters.   Instructions:  May follow [facility] Anaphylaxis Protocol for First Dose Administration in the home: 0.9% NaCl at 25-50 ml/hr to maintain IV access for protocol meds. Epinephrine 0.3 ml IV/IM PRN and Benadryl 25-50 IV/IM PRN s/s of anaphylaxis.   Instructions:  AdFloranfusion Coordinator (RN) to assist per patient IV care needs in the home PRN.      Allergies as of 08/30/2018      Reactions   Cephalexin Swelling, Other (See Comments)   Tolerated Rocephin 10/19. Tongue swelling, but no breathing issues Tolerated Zosyn in Sept 2019 without any issues   Statins Other (See Comments)   Weak muscles   Ciprofloxacin Rash      Medication List    STOP taking these medications   pantoprazole 40 MG tablet Commonly known as:  PROTONIX     TAKE these medications   acetaminophen 325 MG tablet Commonly known as:  TYLENOL Take 650 mg by mouth every 6 (six) hours as needed for mild pain or headache.   Alirocumab 150 MG/ML Sopn Inject 150 mg into the skin every 14 (fourteen) days.   allopurinol 100 MG tablet Commonly known as:  ZYLOPRIM TAKE ONE TABLET BY MOUTH ONCE DAILY   aspirin EC 81 MG tablet Take 81 mg by mouth daily.   betamethasone dipropionate 0.05 % cream Commonly known as:  DIPROLENE Apply 1 application topically daily as needed (leg sores).   cefTAZidime  IVPB Commonly known as:  FORTAZ Inject 2 g into the vein Every Tuesday,Thursday,and Saturday with dialysis. Indication: bacteremia Last Day of Therapy: 09/04/2018 Labs - Once weekly:  CBC/D and BMP, Labs - Every other week:  ESR and CRP   citalopram 10 MG tablet Commonly known as:  CELEXA Take 1 tablet (10 mg total) by mouth at bedtime.   colchicine 0.6 MG tablet Take 1 tablet (0.6 mg total) by mouth daily.   doxercalciferol 4 MCG/2ML injection Commonly known as:  [name] Inject 2 mLs (4 mcg total) into the vein Every Tuesday,Thursday,and Saturday with dialysis.   ezetimibe 10 MG tablet Commonly known as:  ZETIA TAKE ONE TABLET BY MOUTH ONCE DAILY   famotidine 20 MG tablet Commonly known as:  PEPCID Take 1 tablet (20 mg total) by mouth daily. Start taking on:  08/31/2018   feeding supplement (NEPRO CARB STEADY) Liqd Take 237 mLs by mouth 2 (two) times daily between meals.   ferric citrate 1 GM 210 MG(Fe) tablet Commonly known as:  AURYXIA Take 2  tablets (420 mg total) by mouth 3 (three) times daily with meals. What changed:    how much to take  when to take this  additional instructions   gabapentin 100 MG capsule Commonly known as:  NEURONTIN Take 2 capsules (200 mg total) by mouth 3 (three) times daily. What changed:    medication strength  how much to take  when to take this   hydroxypropyl methylcellulose / hypromellose 2.5 % ophthalmic solution Commonly known as:  ISOPTO TEARS / GONIOVISC Place 1 drop into both eyes 3 (three) times daily as needed for dry eyes.   methimazole 5 MG tablet Commonly known as:  TAPAZOLE Take 0.5 tablets (2.5 mg total) by mouth daily.   multivitamin Tabs tablet Take 1 tablet by mouth daily. Reported on 10/24/2015   polyethylene glycol packet Commonly known  as:  MIRALAX / GLYCOLAX Take 17 g by mouth daily. What changed:    when to take this  reasons to take this   SENSIPAR 90 MG tablet Generic drug:  cinacalcet Take 90 mg by mouth 2 (two) times daily.   vancomycin 50 mg/mL  oral solution Commonly known as:  VANCOCIN Take 2.5 mLs (125 mg total) by mouth every 6 (six) hours. Continue to take as prescribed. End date 09/14/18.   WELCHOL 625 MG tablet Generic drug:  colesevelam TAKE THREE TABLETS BY MOUTH TWICE DAILY WITH MEALS What changed:    how much to take  how to take this  when to take this  additional instructions   zolpidem 5 MG tablet Commonly known as:  AMBIEN Take 1 tablet (5 mg total) by mouth at bedtime as needed. for sleep What changed:    reasons to take this  additional instructions            Home Infusion Instuctions  (From admission, onward)         Start     Ordered   08/30/18 0000  Home infusion instructions [facility] May follow [place] Dosing Protocol; May administer Cathflo as needed to maintain patency of vascular access device.; Flushing of vascular access device: per [facility] Protocol: 0.9% NaCl pre/post medica...      Question Answer Comment  Instructions May follow [place] Dosing Protocol   Instructions May administer Cathflo as needed to maintain patency of vascular access device.   Instructions Flushing of vascular access device: per [facility] Protocol: 0.9% NaCl pre/post medication administration and prn patency; Heparin 100 u/ml, 57m for implanted ports and Heparin 10u/ml, 559mfor all other central venous catheters.   Instructions May follow [facility] Anaphylaxis Protocol for First Dose Administration in the home: 0.9% NaCl at 25-50 ml/hr to maintain IV access for protocol meds. Epinephrine 0.3 ml IV/IM PRN and Benadryl 25-50 IV/IM PRN s/s of anaphylaxis.   Instructions [facility] Infusion Coordinator (RN) to assist per patient IV care needs in the home PRN.      08/30/18 1550         Allergies  Allergen Reactions  . Cephalexin Swelling and Other (See Comments)    Tolerated Rocephin 10/19. Tongue swelling, but no breathing issues Tolerated Zosyn in Sept 2019 without any issues  . Statins Other (See Comments)    Weak muscles  . Ciprofloxacin Rash   Follow-up Information    JoBiagio BorgMD. Call in 1 day(s).   Specialties:  Internal Medicine, Radiology Why:  Please call for a post hospital follow-up appointment. Contact information: [phone]      CaConstance HawMD .   Specialty:  Cardiology Contact information: [phone]        GaWonda HornerMD. Call in 1 day(s).   Specialty:  Gastroenterology Why:  Please call for a post hospital follow-up appointment.  Also for possible repeat colonoscopy.  Recommend you call on Monday 09/01/2018 to make an appointment. Contact information: [phone]      [facility] Follow up.   Specialty:  [place]:  to [facility] as established prior to  admission Contact information: [address] [phone]  The results of significant diagnostics from this hospitalization (including imaging, microbiology, ancillary and laboratory) are listed below for reference.    Significant Diagnostic Studies: Ct Abdomen Pelvis Wo Contrast  Result Date: 08/20/2018 CLINICAL DATA:  Abdominal pain, some nausea and vomiting, poor historian EXAM: CT ABDOMEN AND PELVIS WITHOUT CONTRAST TECHNIQUE: Multidetector CT imaging of the abdomen and pelvis was performed following the standard protocol without IV contrast. COMPARISON:  CT abdomen pelvis of 07/16/2018 FINDINGS: Lower chest: There is considerable motion through the lung bases obscuring detail. No obvious pneumonia or pleural effusion is evident. There is mild cardiomegaly which appears stable. Hepatobiliary: The liver is unremarkable in the unenhanced state. Some air is noted within the ductal system most likely due to prior cholecystectomy and probable sphincterotomy. Pancreas: The pancreas is normal in size and the pancreatic duct is not dilated. Spleen: The spleen is unremarkable. Adrenals/Urinary Tract: The adrenal glands appear stable and normal. The kidneys are small and there are multiple low-attenuation structures on this unenhanced study most consistent with renal cysts. Also there is renovascular calcification and probable small renal calculi bilaterally. No hydronephrosis is evident currently. The ureters appear normal in caliber. The urinary bladder is decompressed completely and cannot be evaluated. Stomach/Bowel: The stomach is somewhat decompressed. No abnormality is evident. No small bowel dilatation or edema is seen. The colon is largely decompressed and there are scattered diverticula present. No definite diverticulitis is seen. The terminal ileum is unremarkable. The appendix is better seen on the coronal images and is normal. No inflammatory process  is seen. Vascular/Lymphatic: The abdominal aorta is normal in caliber with significant abdominal aortic atherosclerosis present. No adenopathy is seen. Reproductive: The prostate is small and there are surgical clips posterior to the prostate. Other: No abdominal wall hernia is noted. Musculoskeletal: Hardware for fusion is noted at L4-5 with interbody ray cage fusion at L3-4. There are significant degenerative changes throughout the lumbar spine. IMPRESSION: 1. No definite explanation for the patient's abdominal pain is seen. There are scattered colonic diverticula present but no diverticulitis is noted. 2. The appendix and terminal ileum are unremarkable. 3. Significant abdominal aortic atherosclerosis. 4. Probable small renal cysts, difficult to assess on this unenhanced study, with renovascular calcifications and probable small nonobstructing renal calculi. Electronically Signed   By: Ivar Drape M.D.   On: 08/20/2018 10:22   Dg Wrist 2 Views Left  Result Date: 08/27/2018 CLINICAL DATA:  Painful mildly swollen left wrist.  No known injury. EXAM: LEFT WRIST - 2 VIEW COMPARISON:  Left wrist series of Feb 20, 2007 FINDINGS: The bones are subjectively adequately mineralized. There is deformity of the scaphoid consistent with a previous fracture through the junction of the middle and distal thirds of this bone. There are cystic changes within the distal ulna, waist of the scaphoid, capitate, hamate, and trapezoid. The joint spaces are reasonably maintained with the exception of the first [facility] joint where there is mild narrowing. IMPRESSION: Probable old scaphoid fracture with deformity of the distal aspect of this bone. Cystic changes within multiple carpal bones as well as the distal ulna may reflect gout. Degenerative narrowing of the first [facility] joint. Electronically Signed   By: David  Martinique M.D.   On: 08/27/2018 16:34   Ct Head Wo Contrast  Result Date: 08/19/2018 CLINICAL DATA:  Altered level of  consciousness with fever. EXAM: CT HEAD WITHOUT CONTRAST TECHNIQUE: Contiguous axial images were obtained from the base of the skull through the vertex without intravenous contrast. COMPARISON:  07/16/2018  FINDINGS: Brain: Mild involutional changes of the brain with minimal small vessel ischemic disease of periventricular white matter. No large vascular territory infarct, hemorrhage or midline shift. No hydrocephalus. Midline fourth ventricle and basal cisterns without effacement. Brainstem and cerebellum appear nonacute. Vascular: No hyperdense vessel signs. Atherosclerosis of the carotid siphons bilaterally. Skull: Intact Sinuses/Orbits: No acute finding. Other: None. IMPRESSION: Minimal small vessel ischemic disease of periventricular white matter. No acute intracranial abnormality. Electronically Signed   By: Ashley Royalty M.D.   On: 08/19/2018 18:45   Dg Chest Port 1 View  Result Date: 08/23/2018 CLINICAL DATA:  Respiratory failure EXAM: PORTABLE CHEST 1 VIEW COMPARISON:  08/22/2018 FINDINGS: Cardiomegaly, pulmonary vascular congestion and mild bibasilar atelectasis again noted. No pneumothorax. Little significant change from the prior study. IMPRESSION: Unchanged appearance of the chest with pulmonary vascular congestion and mild bibasilar atelectasis. Electronically Signed   By: Margarette Canada M.D.   On: 08/23/2018 08:36   Dg Chest Port 1 View  Result Date: 08/22/2018 CLINICAL DATA:  Respiratory failure EXAM: PORTABLE CHEST 1 VIEW COMPARISON:  August 20, 2018 FINDINGS: There is cardiomegaly with pulmonary venous hypertension. There is no appreciable edema or consolidation. No adenopathy. There is aortic atherosclerosis. No bone lesions. IMPRESSION: Pulmonary vascular congestion without frank edema or consolidation. There is aortic atherosclerosis. Aortic Atherosclerosis (ICD10-I70.0). Electronically Signed   By: Lowella Grip III M.D.   On: 08/22/2018 07:43   Dg Chest Port 1 View  Result Date:  08/20/2018 CLINICAL DATA:  Attempted right internal jugular hemodialysis catheter. EXAM: PORTABLE CHEST 1 VIEW COMPARISON:  08/19/2018 FINDINGS: Shallow inspiration. Cardiac enlargement. Central vascular congestion with mild perihilar infiltration likely due to edema. Linear atelectasis or fibrosis in the mid lungs. No blunting of costophrenic angles. No pneumothorax. Mediastinal contours appear intact. Calcification of the aorta. Vascular graft in the left axilla. Postoperative changes in the cervical spine. IMPRESSION: Cardiac enlargement with mild pulmonary vascular congestion and perihilar edema. No pneumothorax. Electronically Signed   By: Lucienne Capers M.D.   On: 08/20/2018 22:30   Dg Chest Port 1 View  Result Date: 08/19/2018 CLINICAL DATA:  Altered mental status EXAM: PORTABLE CHEST 1 VIEW COMPARISON:  07/16/2018 FINDINGS: Cardiomegaly with vascular congestion. Bibasilar atelectasis. No visible effusions or acute bony abnormality. IMPRESSION: Cardiomegaly with vascular congestion and bibasilar atelectasis. Electronically Signed   By: Rolm Baptise M.D.   On: 08/19/2018 16:38   [place] Soft Tissue Non Vascular  Result Date: 08/20/2018 CLINICAL DATA:  Question of fluid collection at dialysis access in the right upper arm. Bacteremia. EXAM: ULTRASOUND RIGHT UPPER EXTREMITY LIMITED TECHNIQUE: Ultrasound examination of the upper extremity soft tissues was performed in the area of clinical concern. COMPARISON:  None FINDINGS: No abnormal fluid collection or abscess identified of the right arm. Patent hemodialysis access site in the right upper arm. A hemodialysis vascular graft is seen contiguous with the access site. IMPRESSION: No abnormal fluid collection to suggest abscess or hematoma. Patent hemodialysis access site in the right upper arm. Electronically Signed   By: Ashley Royalty M.D.   On: 08/20/2018 20:12    Microbiology: Recent Results (from the past 240 hour(s))  C  difficile quick scan w PCR reflex     Status: Abnormal   Collection Time: 08/21/18 11:24 AM  Result Value Ref Range Status   C Diff antigen POSITIVE (A) NEGATIVE Final   C Diff toxin NEGATIVE NEGATIVE Final   C Diff interpretation Results are indeterminate. See PCR results.  Final    Comment: Performed at [facility], [place] [address] [place], [place] [address]  C. Diff by PCR, Reflexed     Status: Abnormal   Collection Time: 08/21/18 11:24 AM  Result Value Ref Range Status   Toxigenic C. Difficile by PCR POSITIVE (A) NEGATIVE Final    Comment: Positive for toxigenic C. difficile with little to no toxin production. Only treat if clinical presentation suggests symptomatic illness. Performed at [facility], [place] [address] [place], [place] [address]      Labs: Basic Metabolic Panel: Recent Labs  Lab 08/24/18 0615 08/25/18 0457 08/25/18 0548 08/26/18 0625 08/26/18 0828 08/27/18 0529 08/29/18 0710 08/29/18 2225 08/30/18 1322  NA 129*  --  136  --  136 137 139 140 139  K 4.7  --  3.9  --  4.1 3.7 4.3 4.3 4.3  CL 99  --  101  --  102 101 105 103 102  CO2 20*  --  25  --  21* 24 26 24 23   GLUCOSE 226*  --  96  --  100* 112* 152* 157* 154*  BUN 48*  --  36*  --  52* 26* 30* 39* 46*  CREATININE 5.65*  --  5.26*  --  7.38* 5.42* 7.13* 8.56* 9.53*  CALCIUM 8.9  --  8.4*  --  8.2* 8.3* 9.1 9.0 9.0  MG 2.5* 1.3* 2.3 2.4  --  2.2  --   --   --   PHOS 5.7* 2.7 4.9* 7.5* 7.5* 5.7*  --  4.7* 4.4   Liver Function Tests: Recent Labs  Lab 08/24/18 0615 08/26/18 0828 08/27/18 0529 08/29/18 2225 08/30/18 1322  ALBUMIN 2.0* 2.1* 2.2* 2.3* 2.3*   No results for input(s): LIPASE, AMYLASE in the last 168 hours. No results for input(s): AMMONIA in the last 168 hours. CBC: Recent Labs  Lab 08/24/18 0549 08/25/18 0548 08/26/18 0625 08/27/18 0529 08/29/18 0710 08/29/18 2225 08/30/18 1321  WBC 9.2 10.1 7.7 5.8 5.5 6.5 6.2  NEUTROABS 6.0 5.8 4.0 2.9 3.0  --   --   HGB  10.9* 10.4* 10.5* 10.4* 10.7* 9.8* 9.5*  HCT 36.2* 32.8* 33.5* 33.1* 35.7* 32.8* 31.3*  MCV 95.5 92.7 92.5 93.0 96.0 97.0 97.5  PLT 133* 152 188 203 276 284 266   Cardiac Enzymes: No results for input(s): CKTOTAL, CKMB, CKMBINDEX, TROPONINI in the last 168 hours. BNP: BNP (last 3 results) No results for input(s): BNP in the last 8760 hours.  ProBNP (last 3 results) No results for input(s): PROBNP in the last 8760 hours.  CBG: Recent Labs  Lab 08/29/18 1059 08/29/18 1617 08/29/18 2117 08/30/18 0718 08/30/18 1117  GLUCAP 159* 107* 146* 144* 116*       Signed:  Kayleen Memos, MD [facility] 08/30/2018, 3:51 PM

## 2018-08-30 NOTE — Progress Notes (Addendum)
[facility] Progress Note   Dialysis Orders: SW TTS 4h 98.5kg 2/2.25 bath P2 [name] 4 no ESA/FeHep none  Assessment/Plan: 1.Klebsiella sepsis- recurrent bacteremia - source unclear - on IV Rocephin -ID rec 2 weeks of Fortaz counting 11/21 as day 1 TEE neg for endocarditis.TMax 98.6  Abd CT 11/20 no obvious source for recurrent bacteremia. Consider GI eval for source of recurrent GNR sepsis, hx of rectal ulcers/ radiation proctitis (hx prostate cancer) in the past 2. ESRD- TTS K4.3 , required short term CRRT 11/20 - 11/22- next HD Saturday- HD today - labs pre HD 3. Anemia- hgb 10.7 trending down to 9.8 Friday  - normally in the 12s - start low dose ESA Aranesp 25 today 4. Secondary hyperparathyroidism- resumedHectorol 4 - on sensipar but no binder P 5.7 - resumed Aurixya 5.HTN/volume- bed weights above EDW- get standing wt at next HD 6. Nutrition- renal carb mod diet - has renavites and nepro supplements alb low 7. Left wrist pain -xray showed "Probable old scaphoid fracture with deformity of the distal aspect of this bone. Cystic changes within multiple carpal bones as well as the distal ulna may reflect gout. Degenerative narrowing of the first [facility] joint."On allopurinol. Never had toradol.- given one dose colchicine Wed -overall improved 8. C diff - oral Vanc through 12/15 - contact precautions after d/c 9. OSA - CPAP at night 10. Disp - possible d/c today  Myriam Jacobson, PA-C [facility] Beeper [phone] 08/30/2018,8:42 AM  LOS: 11 days   Pt seen, examined and agree w A/P as above.  Kelly Splinter MD [facility] pager [phone]   08/30/2018, 12:50 PM    Subjective:   Slept poorly last night.  Objective Vitals:   08/29/18 1620 08/29/18 2033 08/30/18 0500 08/30/18 0518  BP: (!) 157/68 (!) 155/71  119/71  Pulse: 75 76  84  Resp: 18 20  18   Temp: 98.6 F (37 C) 98.5 F (36.9 C)  97.8 F (36.6 C)   TempSrc: Oral Oral  Oral  SpO2: 94% 97%  91%  Weight: 103.6 kg  103 kg   Height:       Physical Exam General: NAD still wearing CPAP Heart: RRR Lungs: no rales Abdomen: obese soft NT Extremities: no LE edema  Dialysis Access: right upper AVF + bruit   Additional Objective Labs: Basic Metabolic Panel: Recent Labs  Lab 08/26/18 0828 08/27/18 0529 08/29/18 0710 08/29/18 2225  NA 136 137 139 140  K 4.1 3.7 4.3 4.3  CL 102 101 105 103  CO2 21* 24 26 24   GLUCOSE 100* 112* 152* 157*  BUN 52* 26* 30* 39*  CREATININE 7.38* 5.42* 7.13* 8.56*  CALCIUM 8.2* 8.3* 9.1 9.0  PHOS 7.5* 5.7*  --  4.7*   Liver Function Tests: Recent Labs  Lab 08/26/18 0828 08/27/18 0529 08/29/18 2225  ALBUMIN 2.1* 2.2* 2.3*   No results for input(s): LIPASE, AMYLASE in the last 168 hours. CBC: Recent Labs  Lab 08/25/18 0548 08/26/18 0625 08/27/18 0529 08/29/18 0710 08/29/18 2225  WBC 10.1 7.7 5.8 5.5 6.5  NEUTROABS 5.8 4.0 2.9 3.0  --   HGB 10.4* 10.5* 10.4* 10.7* 9.8*  HCT 32.8* 33.5* 33.1* 35.7* 32.8*  MCV 92.7 92.5 93.0 96.0 97.0  PLT 152 188 203 276 284   Blood Culture    Component Value Date/Time   SDES BLOOD RIGHT ARM 08/19/2018 1723   SPECREQUEST  08/19/2018 1723    BOTTLES DRAWN AEROBIC AND ANAEROBIC Blood  Culture results may not be optimal due to an inadequate volume of blood received in culture bottles   CULT KLEBSIELLA PNEUMONIAE (A) 08/19/2018 1723   REPTSTATUS 08/22/2018 FINAL 08/19/2018 1723    Cardiac Enzymes: No results for input(s): CKTOTAL, CKMB, CKMBINDEX, TROPONINI in the last 168 hours. CBG: Recent Labs  Lab 08/29/18 0744 08/29/18 1059 08/29/18 1617 08/29/18 2117 08/30/18 0718  GLUCAP 136* 159* 107* 146* 144*   Iron Studies: No results for input(s): IRON, TIBC, TRANSFERRIN, FERRITIN in the last 72 hours. Lab Results  Component Value Date   INR 1.15 08/19/2018   INR 1.12 07/16/2018   INR 1.09 07/16/2018   Studies/Results: No results  found. Medications: . sodium chloride    . sodium chloride    . cefTRIAXone (ROCEPHIN)  IV 2 g (08/29/18 2212)  . dextrose Stopped (08/25/18 0845)   . allopurinol  100 mg Oral Daily  . aspirin  81 mg Oral Daily  . Chlorhexidine Gluconate Cloth  6 each Topical Q0600  . cinacalcet  90 mg Oral BID WC  . citalopram  10 mg Oral QHS  . colesevelam  1,875 mg Oral BID WC  . doxercalciferol  4 mcg Intravenous Q T,Th,Sa-HD  . ezetimibe  10 mg Oral Daily  . famotidine  20 mg Oral Daily  . feeding supplement (NEPRO CARB STEADY)  237 mL Oral BID BM  . ferric citrate  420 mg Oral TID WC  . gabapentin  200 mg Oral TID  . heparin injection (subcutaneous)  5,000 Units Subcutaneous Q8H  . insulin aspart  0-9 Units Subcutaneous TID WC  . methimazole  2.5 mg Oral Daily  . multivitamin  1 tablet Oral Daily  . vancomycin  125 mg Oral Q6H

## 2018-08-30 NOTE — Care Management Note (Signed)
Case Management Note  Patient Details  Name: [name] MRN: [id number] Date of Birth: [birth year]  Subjective/Objective:                    Action/Plan:  [facility] notified of patient's DC. Patient will resume [facility] services as established PTA.  Expected Discharge Date:  08/30/18               Expected Discharge Plan:  [place]  In-House Referral:     Discharge planning Services  CM Consult  Post Acute Care Choice:  Resumption of Svcs/PTA Provider, Home Health Choice offered to:     DME Arranged:    DME Agency:     HH Arranged:  RN, PT, OT, Nurse's Aide [place] Agency:  [facility] ([facility]  Status of Service:  Completed, signed off  If discussed at [employer], dates discussed:    Additional Comments:  Carles Collet, RN 08/30/2018, 11:10 AM

## 2018-08-30 NOTE — Progress Notes (Signed)
Pt currently on CPAP for the night and tolerating well.

## 2018-08-30 NOTE — Progress Notes (Signed)
PHARMACY CONSULT NOTE FOR:  OUTPATIENT  PARENTERAL ANTIBIOTIC THERAPY (OPAT)  Indication: bacteremia Regimen: fortaz 2gm IV TTS with HD End date: 09/04/2018  IV antibiotic discharge orders are pended. To discharging provider:  please sign these orders via discharge navigator,  Select New Orders & click on the button choice - Manage This Unsigned Work.     Thank you for allowing pharmacy to be a part of this patient's care.  Hildred Laser, PharmD Clinical Pharmacist **Pharmacist phone directory can now be found on [email or web address] ([email or web address]).  Listed under [place].

## 2018-08-31 DIAGNOSIS — N186 End stage renal disease: Secondary | ICD-10-CM | POA: Diagnosis not present

## 2018-08-31 DIAGNOSIS — Z992 Dependence on renal dialysis: Secondary | ICD-10-CM | POA: Diagnosis not present

## 2018-08-31 DIAGNOSIS — E1129 Type 2 diabetes mellitus with other diabetic kidney complication: Secondary | ICD-10-CM | POA: Diagnosis not present

## 2018-08-31 LAB — GLUCOSE, CAPILLARY
GLUCOSE-CAPILLARY: 148 mg/dL — AB (ref 70–99)
Glucose-Capillary: 97 mg/dL (ref 70–99)

## 2018-08-31 MED ORDER — VANCOMYCIN HCL 125 MG PO CAPS: 125.0000 mg | ORAL_CAPSULE | Freq: Four times a day (QID) | ORAL | 0 refills | Status: DC

## 2018-08-31 MED ORDER — GABAPENTIN 100 MG PO CAPS: 100.0000 mg | ORAL_CAPSULE | Freq: Every day | ORAL | 0 refills | Status: DC

## 2018-08-31 MED ORDER — DOXERCALCIFEROL 4 MCG/2ML IV SOLN: 2.0000 ug | INTRAVENOUS | 0 refills | Status: AC

## 2018-08-31 MED ORDER — DOXERCALCIFEROL 4 MCG/2ML IV SOLN: 2.0000 ug | INTRAVENOUS | Status: DC

## 2018-08-31 MED ORDER — VANCOMYCIN HCL 125 MG PO CAPS: 125.0000 mg | ORAL_CAPSULE | Freq: Four times a day (QID) | ORAL | 0 refills | Status: AC

## 2018-08-31 NOTE — Progress Notes (Addendum)
[facility] Progress Note   Dialysis Orders: SW TTS 4h 98.5kg 2/2.25 bath P2 [name] 4 no ESA/FeHep none   Assessment/Plan: 1.Klebsiella sepsis- recurrent bacteremia - source unclear - on IV Rocephin -ID rec 2 weeks of Fortaz counting 11/21 as day 1 TEE neg for endocarditis. Abd CT 11/20 no obvious source for recurrent bacteremia. Consider GI eval for source of recurrent GNR sepsis, hx of rectal ulcers/ radiation proctitis (hx prostate cancer) in the past- needs to f/u with GI for possible colonoscopy after d/c, 2. ESRD- TTS K4.3, required short term CRRT 11/20 - 11/22 3. Anemia- hgb 10.7 trending down to 9.5 Sat  - normally in the 12s - start low dose ESA Aranesp 25 11/30 4. Secondary hyperparathyroidism- Corrected Ca high - reduce [name] from 4 >2 and change to 2 Ca bath at d/c  - on sensipar but no binder P 4.4 on Aurixya 5.HTN/volume- neg UF 2.5 L Sat with post wt 98.3 - at EDW- BP stable 6. Nutrition- renal carb mod diet - has renavites and nepro supplements alb low 7. Left wrist pain -xray showed "Probable old scaphoid fracture with deformity of the distal aspect of this bone. Cystic changes within multiple carpal bones as well as the distal ulna may reflect gout. Degenerative narrowing of the first [facility] joint."On allopurinol.Never had toradol.- given one dose colchicineWed -overall improved 8. C diff - oral Vanc through 12/15 - contact precautions after d/c 9.OSA -CPAP at night 10. Disp -d/c delayed due to issue getting po [place], PA-C [place] [phone] 08/31/2018,8:05 AM  LOS: 12 days   Pt seen, examined and agree w A/P as above.  Kelly Splinter MD [facility] pager [phone]   08/31/2018, 12:37 PM    Subjective:   Feels good.  Would like to go home. No problems with HD yesterday  Objective Vitals:   08/30/18 1630 08/30/18 1700 08/30/18 1705 08/31/18 0653  BP: (!)  119/59 117/68 130/60 125/60  Pulse: 75 77 79 75  Resp:   18 18  Temp:   (!) 97.5 F (36.4 C) 98.4 F (36.9 C)  TempSrc:   Oral Oral  SpO2:   98% (!) 66%  Weight:   98.3 kg   Height:       Physical Exam General:aelrt, no distress Heart: RRR no mrg Lungs: chest cta bilat Abdomen: soft ntnd obese Extremities: no sig edema Dialysis Access: RUA AVF +bruit   Additional Objective Labs: Basic Metabolic Panel: Recent Labs  Lab 08/27/18 0529 08/29/18 0710 08/29/18 2225 08/30/18 1322  NA 137 139 140 139  K 3.7 4.3 4.3 4.3  CL 101 105 103 102  CO2 24 26 24 23   GLUCOSE 112* 152* 157* 154*  BUN 26* 30* 39* 46*  CREATININE 5.42* 7.13* 8.56* 9.53*  CALCIUM 8.3* 9.1 9.0 9.0  PHOS 5.7*  --  4.7* 4.4   Liver Function Tests: Recent Labs  Lab 08/27/18 0529 08/29/18 2225 08/30/18 1322  ALBUMIN 2.2* 2.3* 2.3*   No results for input(s): LIPASE, AMYLASE in the last 168 hours. CBC: Recent Labs  Lab 08/26/18 0625 08/27/18 0529 08/29/18 0710 08/29/18 2225 08/30/18 1321  WBC 7.7 5.8 5.5 6.5 6.2  NEUTROABS 4.0 2.9 3.0  --   --   HGB 10.5* 10.4* 10.7* 9.8* 9.5*  HCT 33.5* 33.1* 35.7* 32.8* 31.3*  MCV 92.5 93.0 96.0 97.0 97.5  PLT 188 203 276 284 266   Blood Culture    Component Value  Date/Time   SDES BLOOD RIGHT ARM 08/19/2018 1723   SPECREQUEST  08/19/2018 1723    BOTTLES DRAWN AEROBIC AND ANAEROBIC Blood Culture results may not be optimal due to an inadequate volume of blood received in culture bottles   CULT KLEBSIELLA PNEUMONIAE (A) 08/19/2018 1723   REPTSTATUS 08/22/2018 FINAL 08/19/2018 1723    Cardiac Enzymes: No results for input(s): CKTOTAL, CKMB, CKMBINDEX, TROPONINI in the last 168 hours. CBG: Recent Labs  Lab 08/29/18 2117 08/30/18 0718 08/30/18 1117 08/30/18 2106 08/31/18 0734  GLUCAP 146* 144* 116* 115* 97   Iron Studies: No results for input(s): IRON, TIBC, TRANSFERRIN, FERRITIN in the last 72 hours. Lab Results  Component Value Date   INR 1.15  08/19/2018   INR 1.12 07/16/2018   INR 1.09 07/16/2018   Studies/Results: No results found. Medications: . cefTRIAXone (ROCEPHIN)  IV 2 g (08/29/18 2212)  . dextrose Stopped (08/25/18 0845)   . allopurinol  100 mg Oral Daily  . aspirin  81 mg Oral Daily  . Chlorhexidine Gluconate Cloth  6 each Topical Q0600  . cinacalcet  90 mg Oral BID WC  . citalopram  10 mg Oral QHS  . colesevelam  1,875 mg Oral BID WC  . darbepoetin (ARANESP) injection - DIALYSIS  25 mcg Intravenous Q Sat-HD  . doxercalciferol  4 mcg Intravenous Q T,Th,Sa-HD  . ezetimibe  10 mg Oral Daily  . famotidine  20 mg Oral Daily  . feeding supplement (NEPRO CARB STEADY)  237 mL Oral BID BM  . ferric citrate  420 mg Oral TID WC  . gabapentin  200 mg Oral TID  . heparin injection (subcutaneous)  5,000 Units Subcutaneous Q8H  . insulin aspart  0-9 Units Subcutaneous TID WC  . methimazole  2.5 mg Oral Daily  . multivitamin  1 tablet Oral Daily  . vancomycin  125 mg Oral Q6H

## 2018-08-31 NOTE — Progress Notes (Addendum)
07:40 Acknowledge consult for assistance w Vanc. Patient has insurance and there is no copay assistance available. Notified MD that Vanc in capsule form is less expensive than liquid with most medicare policies.  10:45 [facility] ran Rx for $60.89, tried several pharmacies to find one that had it in stock. [facility] does. Notified Dr Nevada Crane to send Rx there. Notified Pt's wife that script will be available there and estimated cost.

## 2018-08-31 NOTE — Discharge Summary (Addendum)
Discharge Summary  [name] TMA:[id number] DOB: October 26, 1950  PCP: Biagio Borg, MD  Admit date: 08/19/2018 Discharge date: 08/31/2018  Time spent: 35 minutes  Recommendations for Outpatient Follow-up:  1. Follow-up with nephrology 2. Follow-up with your PCP 3. Follow-up with infectious disease 4. Follow-up with GI for possible repeat colonoscopy.  Please call for an appointment 5. Take your medications as prescribed 6. Continue hemodialysis as scheduled 7. Contact precaution at home due to C. difficile  Discharge Diagnoses:  Active Hospital Problems   Diagnosis Date Noted  . Sepsis ([place]) 07/16/2018  . Altered mental status   . Gram-negative bacteremia   . Chronic combined systolic (congestive) and diastolic (congestive) heart failure ([place]) 01/15/2018  . Chronic GI bleeding 04/22/2017  . Status post coronary artery stent placement   . PAD (peripheral artery disease) ([place]) 06/18/2016  . DM (diabetes mellitus) type II controlled with renal manifestation ([place]) 10/31/2015  . Acute encephalopathy 08/09/2015  . Hyperthyroidism 03/11/2015  . ESRD on hemodialysis ([place]) 08/16/2014  . Encounter for hemodialysis ([place]) 08/16/2014  . OSA (obstructive sleep apnea) 10/16/2011  . Ischemic cardiomyopathy 06/16/2011    Resolved Hospital Problems  No resolved problems to display.    Discharge Condition: Stable  Diet recommendation: Resume previous diet renal hemodialysis diet  Vitals:   08/30/18 1705 08/31/18 0653  BP: 130/60 125/60  Pulse: 79 75  Resp: 18 18  Temp: (!) 97.5 F (36.4 C) 98.4 F (36.9 C)  SpO2: 98% (!) 66%    History of present illness:  67 yo male was at outpt HD and developed severe abdominal pain with fever 104F and hypotension. Initially admitted to the ICU for septic shock requiring pressors.Found to have recurrent Klebsiella bacteremia. No clear source. CT abd pelvis revealed colonic diverticula.  Patient has a history of rectal ulcers/radiation  proctitis from history of prostate cancer which is questioned to be a source of his infection.  Recommended outpatient follow-up with [facility].  Patient states he has had a colonoscopy 2 or 3 years ago and at this time he is not interested in having another one.  Patient advised to follow-up with GI.  Also positive c-diff and treated with oral vancomycin. Started to have formed stools on 08/28/18.  Hospital course complicated by initially ICU delirium which has resolved, left wrist swelling with high suspicion for gout. Improved with colchicine.  08/30/2018: Patient seen and examined his bedside.  No acute events overnight.  Self reports he had 2 formed stools yesterday.  Denies abdominal pain or nausea.  He is afebrile with no leukocytosis.  Hemodialysis session completed 08/30/2018.  08/31/18: Seen and examined at his bedside. No acute events overnight. No new complaints. Medications, po vancomycin, available and independently [facility] [address], [place]. Will cost 60$ for all 14 days. Patient will be discharge to home with home health PT RN.  On the day of discharge, the patient was hemodynamically stable.  He will need to follow-up with nephrology, his PCP, take his medications as prescribed and continue his scheduled hemodialysis sessions.   Hospital Course:  Principal Problem:   Sepsis ([place]) Active Problems:   Ischemic cardiomyopathy   OSA (obstructive sleep apnea)   Hyperthyroidism   Acute encephalopathy   DM (diabetes mellitus) type II controlled with renal manifestation ([name])   PAD (peripheral artery disease) ([place])   Status post coronary artery stent placement   ESRD on hemodialysis ([facility])   Encounter for hemodialysis ([place])   Chronic GI bleeding  Chronic combined systolic (congestive) and diastolic (congestive) heart failure ([name])   Altered mental status   Gram-negative bacteremia   Resolving septic shock 2/2 Recurrent klebsiella bacteremia Sepsis  physiology is resolving Suspect GI source.  May consider repeat colonoscopy. CT abdomen pelvis done on 08/20/2018 revealed no obvious source for recurrent bacteremia. Patient has a history of rectal ulcers/radiation proctitis from history of prostate cancer Will need to follow-up with his gastroenterologist outpatient.  Has seen [facility] in the past Positive blood cx from 08/19/18 Repeat blood cultures x2 ID following and recommends 2 weeks of IV ceftaz post hemodialysis sessions. TEE unremarkable for any acute findings.  Negative for endocarditis. 08/21/18 as day 1 of 14 through 09/04/2018. Continue IV ceftaz as recommended by ID post hemodialysis  Left wrist acute gout flare, improving Continue allopurinol Given 1 dose of colchicine with some improvement Repeat dose of colchicine up to 2-3 times a week  C-diff infection, improving Continue po vancomycin 125 mg qid for additional 10 days after IV abx has need per ID through 09/14/2018. Follow up with ID oupt Started formed stool 08/28/2018 with decrease of stool frequency  ESRD on HD on Tuesday Thursday Saturday Last HD on 08/30/2018 Keep your hemodialysis appointments  OSA cpap qhs  Hx of gout with suspected gout flare to left wrist 1 dose 0.6 mg colchicine given once Can repeat dose of colchicine up to 2-3 times per week  DM with peripheral neuropathy. Plan - SSI - resume gabapentin, celexa  Hx of hyperthyroidism. Plan -Continue methimazole  Physical debility PT assessed and recommended home health PT Continue physical therapy at home Fall precautions   Code Status: Full code     Discharge Exam: BP 125/60 (BP Location: Left Arm)   Pulse 75   Temp 98.4 F (36.9 C) (Oral)   Resp 18   Ht 6' (1.829 m)   Wt 98.3 kg   SpO2 (!) 66%   BMI 29.39 kg/m  . General: 67 y.o. year-old male well-developed well-nourished in no acute distress.  Alert and oriented x3.   . Cardiovascular: Regular rate and  rhythm with no rubs or gallops.  No JVD or thyromegaly noted.   [name] Respiratory: Clear to auscultation with no wheezes or rales.  Good inspiratory effort. . Abdomen: Soft nontender nondistended with normal bowel sounds x4 quadrant. . Musculoskeletal: No lower extremity edema.  2 out of 4 pulses in all 4 extremities. . Skin: No ulcerative lesions noted or rashes, . Psychiatry: Mood is appropriate for condition and setting  Discharge Instructions You were cared for by a hospitalist during your hospital stay. If you have any questions about your discharge medications or the care you received while you were in the hospital after you are discharged, you can call the unit and asked to speak with the hospitalist on call if the hospitalist that took care of you is not available. Once you are discharged, your primary care physician will handle any further medical issues. Please note that NO REFILLS for any discharge medications will be authorized once you are discharged, as it is imperative that you return to your primary care physician (or establish a relationship with a primary care physician if you do not have one) for your aftercare needs so that they can reassess your need for medications and monitor your lab values.  Discharge Instructions    Home infusion instructions [facility] May follow [place] Dosing Protocol; May administer Cathflo as needed to maintain patency of vascular access  device.; Flushing of vascular access device: per [facility] Protocol: 0.9% NaCl pre/post medica...   Complete by:  As directed    Instructions:  May follow [place] Dosing Protocol   Instructions:  May administer Cathflo as needed to maintain patency of vascular access device.   Instructions:  Flushing of vascular access device: per [facility] Protocol: 0.9% NaCl pre/post medication administration and prn patency; Heparin 100 u/ml, 56m for implanted ports and Heparin 10u/ml, 571mfor all other central venous catheters.    Instructions:  May follow [facility] Anaphylaxis Protocol for First Dose Administration in the home: 0.9% NaCl at 25-50 ml/hr to maintain IV access for protocol meds. Epinephrine 0.3 ml IV/IM PRN and Benadryl 25-50 IV/IM PRN s/s of anaphylaxis.   Instructions:  AdDuane Lakenfusion Coordinator (RN) to assist per patient IV care needs in the home PRN.     Allergies as of 08/31/2018      Reactions   Cephalexin Swelling, Other (See Comments)   Tolerated Rocephin 10/19. Tongue swelling, but no breathing issues Tolerated Zosyn in Sept 2019 without any issues   Statins Other (See Comments)   Weak muscles   Ciprofloxacin Rash      Medication List    STOP taking these medications   pantoprazole 40 MG tablet Commonly known as:  PROTONIX     TAKE these medications   acetaminophen 325 MG tablet Commonly known as:  TYLENOL Take 650 mg by mouth every 6 (six) hours as needed for mild pain or headache.   Alirocumab 150 MG/ML Sopn Inject 150 mg into the skin every 14 (fourteen) days.   allopurinol 100 MG tablet Commonly known as:  ZYLOPRIM TAKE ONE TABLET BY MOUTH ONCE DAILY   aspirin EC 81 MG tablet Take 81 mg by mouth daily.   betamethasone dipropionate 0.05 % cream Commonly known as:  DIPROLENE Apply 1 application topically daily as needed (leg sores).   cefTAZidime  IVPB Commonly known as:  FORTAZ Inject 2 g into the vein Every Tuesday,Thursday,and Saturday with dialysis. Indication: bacteremia Last Day of Therapy: 09/04/2018 Labs - Once weekly:  CBC/D and BMP, Labs - Every other week:  ESR and CRP   citalopram 10 MG tablet Commonly known as:  CELEXA Take 1 tablet (10 mg total) by mouth at bedtime.   colchicine 0.6 MG tablet Take 1 tablet (0.6 mg total) by mouth daily.   doxercalciferol 4 MCG/2ML injection Commonly known as:  [name] Inject 1 mL (2 mcg total) into the vein Every Tuesday,Thursday,and Saturday with dialysis. Start taking on:  09/02/2018 What changed:  how  much to take   ezetimibe 10 MG tablet Commonly known as:  ZETIA TAKE ONE TABLET BY MOUTH ONCE DAILY   famotidine 20 MG tablet Commonly known as:  PEPCID Take 1 tablet (20 mg total) by mouth daily.   feeding supplement (NEPRO CARB STEADY) Liqd Take 237 mLs by mouth 2 (two) times daily between meals.   ferric citrate 1 GM 210 MG(Fe) tablet Commonly known as:  AURYXIA Take 2 tablets (420 mg total) by mouth 3 (three) times daily with meals. What changed:    how much to take  when to take this  additional instructions   gabapentin 100 MG capsule Commonly known as:  NEURONTIN Take 1 capsule (100 mg total) by mouth at bedtime. What changed:    medication strength  how much to take   hydroxypropyl methylcellulose / hypromellose 2.5 % ophthalmic solution Commonly known as:  ISOPTO TEARS / GONIOVISC  Place 1 drop into both eyes 3 (three) times daily as needed for dry eyes.   methimazole 5 MG tablet Commonly known as:  TAPAZOLE Take 0.5 tablets (2.5 mg total) by mouth daily.   multivitamin Tabs tablet Take 1 tablet by mouth daily. Reported on 10/24/2015   polyethylene glycol packet Commonly known as:  MIRALAX / GLYCOLAX Take 17 g by mouth daily. What changed:    when to take this  reasons to take this   SENSIPAR 90 MG tablet Generic drug:  cinacalcet Take 90 mg by mouth 2 (two) times daily.   vancomycin 125 MG capsule Commonly known as:  VANCOCIN Take 1 capsule (125 mg total) by mouth 4 (four) times daily for 14 days.   WELCHOL 625 MG tablet Generic drug:  colesevelam TAKE THREE TABLETS BY MOUTH TWICE DAILY WITH MEALS What changed:    how much to take  how to take this  when to take this  additional instructions   zolpidem 5 MG tablet Commonly known as:  AMBIEN Take 1 tablet (5 mg total) by mouth at bedtime as needed. for sleep What changed:    reasons to take this  additional instructions            Home Infusion Instuctions  (From  admission, onward)         Start     Ordered   08/30/18 0000  Home infusion instructions [facility] May follow [place] Dosing Protocol; May administer Cathflo as needed to maintain patency of vascular access device.; Flushing of vascular access device: per [facility] Protocol: 0.9% NaCl pre/post medica...    Question Answer Comment  Instructions May follow [place] Dosing Protocol   Instructions May administer Cathflo as needed to maintain patency of vascular access device.   Instructions Flushing of vascular access device: per [facility] Protocol: 0.9% NaCl pre/post medication administration and prn patency; Heparin 100 u/ml, 59m for implanted ports and Heparin 10u/ml, 549mfor all other central venous catheters.   Instructions May follow [facility] Anaphylaxis Protocol for First Dose Administration in the home: 0.9% NaCl at 25-50 ml/hr to maintain IV access for protocol meds. Epinephrine 0.3 ml IV/IM PRN and Benadryl 25-50 IV/IM PRN s/s of anaphylaxis.   Instructions [facility] Infusion Coordinator (RN) to assist per patient IV care needs in the home PRN.      08/30/18 1550         Allergies  Allergen Reactions  . Cephalexin Swelling and Other (See Comments)    Tolerated Rocephin 10/19. Tongue swelling, but no breathing issues Tolerated Zosyn in Sept 2019 without any issues  . Statins Other (See Comments)    Weak muscles  . Ciprofloxacin Rash   Follow-up Information    JoBiagio BorgMD. Call in 1 day(s).   Specialties:  Internal Medicine, Radiology Why:  Please call for a post hospital follow-up appointment. Contact information: [phone]      CaConstance HawMD .   Specialty:  Cardiology Contact information: [address] [phone]        GaWonda HornerMD. Call in 1 day(s).   Specialty:  Gastroenterology Why:  Please call for a post hospital follow-up appointment.   Also for possible repeat colonoscopy.  Recommend you call on Monday 09/01/2018 to make an appointment. Contact information: [phone]  Home, [facility] Follow up.   Specialty:  [place] Why:  to resume [facility] services as established prior to admission Contact information: [place] [address] [phone]            The results of significant diagnostics from this hospitalization (including imaging, microbiology, ancillary and laboratory) are listed below for reference.    Significant Diagnostic Studies: Ct Abdomen Pelvis Wo Contrast  Result Date: 08/20/2018 CLINICAL DATA:  Abdominal pain, some nausea and vomiting, poor historian EXAM: CT ABDOMEN AND PELVIS WITHOUT CONTRAST TECHNIQUE: Multidetector CT imaging of the abdomen and pelvis was performed following the standard protocol without IV contrast. COMPARISON:  CT abdomen pelvis of 07/16/2018 FINDINGS: Lower chest: There is considerable motion through the lung bases obscuring detail. No obvious pneumonia or pleural effusion is evident. There is mild cardiomegaly which appears stable. Hepatobiliary: The liver is unremarkable in the unenhanced state. Some air is noted within the ductal system most likely due to prior cholecystectomy and probable sphincterotomy. Pancreas: The pancreas is normal in size and the pancreatic duct is not dilated. Spleen: The spleen is unremarkable. Adrenals/Urinary Tract: The adrenal glands appear stable and normal. The kidneys are small and there are multiple low-attenuation structures on this unenhanced study most consistent with renal cysts. Also there is renovascular calcification and probable small renal calculi bilaterally. No hydronephrosis is evident currently. The ureters appear normal in caliber. The urinary bladder is decompressed completely and cannot be evaluated. Stomach/Bowel: The stomach is somewhat  decompressed. No abnormality is evident. No small bowel dilatation or edema is seen. The colon is largely decompressed and there are scattered diverticula present. No definite diverticulitis is seen. The terminal ileum is unremarkable. The appendix is better seen on the coronal images and is normal. No inflammatory process is seen. Vascular/Lymphatic: The abdominal aorta is normal in caliber with significant abdominal aortic atherosclerosis present. No adenopathy is seen. Reproductive: The prostate is small and there are surgical clips posterior to the prostate. Other: No abdominal wall hernia is noted. Musculoskeletal: Hardware for fusion is noted at L4-5 with interbody ray cage fusion at L3-4. There are significant degenerative changes throughout the lumbar spine. IMPRESSION: 1. No definite explanation for the patient's abdominal pain is seen. There are scattered colonic diverticula present but no diverticulitis is noted. 2. The appendix and terminal ileum are unremarkable. 3. Significant abdominal aortic atherosclerosis. 4. Probable small renal cysts, difficult to assess on this unenhanced study, with renovascular calcifications and probable small nonobstructing renal calculi. Electronically Signed   By: Ivar Drape M.D.   On: 08/20/2018 10:22   Dg Wrist 2 Views Left  Result Date: 08/27/2018 CLINICAL DATA:  Painful mildly swollen left wrist.  No known injury. EXAM: LEFT WRIST - 2 VIEW COMPARISON:  Left wrist series of Feb 20, 2007 FINDINGS: The bones are subjectively adequately mineralized. There is deformity of the scaphoid consistent with a previous fracture through the junction of the middle and distal thirds of this bone. There are cystic changes within the distal ulna, waist of the scaphoid, capitate, hamate, and trapezoid. The joint spaces are reasonably maintained with the exception of the first [facility] joint where there is mild narrowing. IMPRESSION: Probable old scaphoid fracture with deformity of the  distal aspect of this bone. Cystic changes within multiple carpal bones as well as the distal ulna may reflect gout. Degenerative narrowing of the first [facility] joint. Electronically Signed   By: David  Martinique M.D.   On: 08/27/2018  16:34   Ct Head Wo Contrast  Result Date: 08/19/2018 CLINICAL DATA:  Altered level of consciousness with fever. EXAM: CT HEAD WITHOUT CONTRAST TECHNIQUE: Contiguous axial images were obtained from the base of the skull through the vertex without intravenous contrast. COMPARISON:  07/16/2018 FINDINGS: Brain: Mild involutional changes of the brain with minimal small vessel ischemic disease of periventricular white matter. No large vascular territory infarct, hemorrhage or midline shift. No hydrocephalus. Midline fourth ventricle and basal cisterns without effacement. Brainstem and cerebellum appear nonacute. Vascular: No hyperdense vessel signs. Atherosclerosis of the carotid siphons bilaterally. Skull: Intact Sinuses/Orbits: No acute finding. Other: None. IMPRESSION: Minimal small vessel ischemic disease of periventricular white matter. No acute intracranial abnormality. Electronically Signed   By: Ashley Royalty M.D.   On: 08/19/2018 18:45   Dg Chest Port 1 View  Result Date: 08/23/2018 CLINICAL DATA:  Respiratory failure EXAM: PORTABLE CHEST 1 VIEW COMPARISON:  08/22/2018 FINDINGS: Cardiomegaly, pulmonary vascular congestion and mild bibasilar atelectasis again noted. No pneumothorax. Little significant change from the prior study. IMPRESSION: Unchanged appearance of the chest with pulmonary vascular congestion and mild bibasilar atelectasis. Electronically Signed   By: Margarette Canada M.D.   On: 08/23/2018 08:36   Dg Chest Port 1 View  Result Date: 08/22/2018 CLINICAL DATA:  Respiratory failure EXAM: PORTABLE CHEST 1 VIEW COMPARISON:  August 20, 2018 FINDINGS: There is cardiomegaly with pulmonary venous hypertension. There is no appreciable edema or consolidation. No adenopathy.  There is aortic atherosclerosis. No bone lesions. IMPRESSION: Pulmonary vascular congestion without frank edema or consolidation. There is aortic atherosclerosis. Aortic Atherosclerosis (ICD10-I70.0). Electronically Signed   By: Lowella Grip III M.D.   On: 08/22/2018 07:43   Dg Chest Port 1 View  Result Date: 08/20/2018 CLINICAL DATA:  Attempted right internal jugular hemodialysis catheter. EXAM: PORTABLE CHEST 1 VIEW COMPARISON:  08/19/2018 FINDINGS: Shallow inspiration. Cardiac enlargement. Central vascular congestion with mild perihilar infiltration likely due to edema. Linear atelectasis or fibrosis in the mid lungs. No blunting of costophrenic angles. No pneumothorax. Mediastinal contours appear intact. Calcification of the aorta. Vascular graft in the left axilla. Postoperative changes in the cervical spine. IMPRESSION: Cardiac enlargement with mild pulmonary vascular congestion and perihilar edema. No pneumothorax. Electronically Signed   By: Lucienne Capers M.D.   On: 08/20/2018 22:30   Dg Chest Port 1 View  Result Date: 08/19/2018 CLINICAL DATA:  Altered mental status EXAM: PORTABLE CHEST 1 VIEW COMPARISON:  07/16/2018 FINDINGS: Cardiomegaly with vascular congestion. Bibasilar atelectasis. No visible effusions or acute bony abnormality. IMPRESSION: Cardiomegaly with vascular congestion and bibasilar atelectasis. Electronically Signed   By: Rolm Baptise M.D.   On: 08/19/2018 16:38   [place] Soft Tissue Non Vascular  Result Date: 08/20/2018 CLINICAL DATA:  Question of fluid collection at dialysis access in the right upper arm. Bacteremia. EXAM: ULTRASOUND RIGHT UPPER EXTREMITY LIMITED TECHNIQUE: Ultrasound examination of the upper extremity soft tissues was performed in the area of clinical concern. COMPARISON:  None FINDINGS: No abnormal fluid collection or abscess identified of the right arm. Patent hemodialysis access site in the right upper arm. A hemodialysis vascular  graft is seen contiguous with the access site. IMPRESSION: No abnormal fluid collection to suggest abscess or hematoma. Patent hemodialysis access site in the right upper arm. Electronically Signed   By: Ashley Royalty M.D.   On: 08/20/2018 20:12    Microbiology: Recent Results (from the past 240 hour(s))  C difficile quick scan w PCR reflex  Status: Abnormal   Collection Time: 08/21/18 11:24 AM  Result Value Ref Range Status   C Diff antigen POSITIVE (A) NEGATIVE Final   C Diff toxin NEGATIVE NEGATIVE Final   C Diff interpretation Results are indeterminate. See PCR results.  Final    Comment: Performed at [facility], [place] [address] [place], [place] [address]  C. Diff by PCR, Reflexed     Status: Abnormal   Collection Time: 08/21/18 11:24 AM  Result Value Ref Range Status   Toxigenic C. Difficile by PCR POSITIVE (A) NEGATIVE Final    Comment: Positive for toxigenic C. difficile with little to no toxin production. Only treat if clinical presentation suggests symptomatic illness. Performed at [facility], [place] [address], [place], [place] [address]   Culture, blood (routine x 2)     Status: None (Preliminary result)   Collection Time: 08/30/18  6:10 AM  Result Value Ref Range Status   Specimen Description BLOOD RIGHT HAND  Final   Special Requests   Final    BOTTLES DRAWN AEROBIC ONLY Blood Culture adequate volume   Culture   Final    NO GROWTH 1 DAY Performed at [facility], [place] [address] [place], [place] [address]    Report Status PENDING  Incomplete  Culture, blood (routine x 2)     Status: None (Preliminary result)   Collection Time: 08/30/18  6:18 AM  Result Value Ref Range Status   Specimen Description BLOOD RIGHT HAND  Final   Special Requests   Final    BOTTLES DRAWN AEROBIC ONLY Blood Culture adequate volume   Culture   Final    NO GROWTH 1 DAY Performed at [facility], [place] [address], [place], [place] [address]    Report Status PENDING   Incomplete     Labs: Basic Metabolic Panel: Recent Labs  Lab 08/25/18 0457  08/25/18 0548 08/26/18 0625 08/26/18 0828 08/27/18 0529 08/29/18 0710 08/29/18 2225 08/30/18 1322  NA  --    < > 136  --  136 137 139 140 139  K  --    < > 3.9  --  4.1 3.7 4.3 4.3 4.3  CL  --    < > 101  --  102 101 105 103 102  CO2  --    < > 25  --  21* 24 26 24 23   GLUCOSE  --    < > 96  --  100* 112* 152* 157* 154*  BUN  --    < > 36*  --  52* 26* 30* 39* 46*  CREATININE  --    < > 5.26*  --  7.38* 5.42* 7.13* 8.56* 9.53*  CALCIUM  --    < > 8.4*  --  8.2* 8.3* 9.1 9.0 9.0  MG 1.3*  --  2.3 2.4  --  2.2  --   --   --   PHOS 2.7  --  4.9* 7.5* 7.5* 5.7*  --  4.7* 4.4   < > = values in this interval not displayed.   Liver Function Tests: Recent Labs  Lab 08/26/18 0828 08/27/18 0529 08/29/18 2225 08/30/18 1322  ALBUMIN 2.1* 2.2* 2.3* 2.3*   No results for input(s): LIPASE, AMYLASE in the last 168 hours. No results for input(s): AMMONIA in the last 168 hours. CBC: Recent Labs  Lab 08/25/18 0548 08/26/18 0625 08/27/18 0529 08/29/18 0710 08/29/18 2225 08/30/18 1321  WBC 10.1  7.7 5.8 5.5 6.5 6.2  NEUTROABS 5.8 4.0 2.9 3.0  --   --   HGB 10.4* 10.5* 10.4* 10.7* 9.8* 9.5*  HCT 32.8* 33.5* 33.1* 35.7* 32.8* 31.3*  MCV 92.7 92.5 93.0 96.0 97.0 97.5  PLT 152 188 203 276 284 266   Cardiac Enzymes: No results for input(s): CKTOTAL, CKMB, CKMBINDEX, TROPONINI in the last 168 hours. BNP: BNP (last 3 results) No results for input(s): BNP in the last 8760 hours.  ProBNP (last 3 results) No results for input(s): PROBNP in the last 8760 hours.  CBG: Recent Labs  Lab 08/29/18 2117 08/30/18 0718 08/30/18 1117 08/30/18 2106 08/31/18 0734  GLUCAP 146* 144* 116* 115* 97       Signed:  Kayleen Memos, MD [facility] 08/31/2018, 10:56 AM

## 2018-09-01 ENCOUNTER — Telehealth: Payer: Self-pay | Admitting: *Deleted

## 2018-09-01 ENCOUNTER — Other Ambulatory Visit: Payer: Self-pay | Admitting: *Deleted

## 2018-09-01 NOTE — Patient Outreach (Signed)
[facility]) Care Management  09/01/2018  [name] 18-Jul-1951 [id number]   Notified member was discharged from hospital yesterday, primary MD will complete transition of care assessment.  Call placed to member's wife to follow up on discharge, she report she does not have a lot of time to talk due to being on her way out the door for her own MD appointment.  She does confirm member has multiple MD appointments this week (PCP & cardiology this week, ID next week) as well as dialysis.  Advised to contact this care manager when she returns to continue telephone assessment.  Will await call back, will follow up within the next 4 business days if no call back.  [name], [place], MSN [place] [phone]

## 2018-09-01 NOTE — Telephone Encounter (Signed)
Transition Care Management Follow-up Telephone Call   Date discharged? 08/31/18   How have you been since you were released from the hospital? Per wife he's doing about the same   Do you understand why you were in the hospital? YES   Do you understand the discharge instructions? YES   Where were you discharged to? Home   Items Reviewed:  Medications reviewed: YES  Allergies reviewed: YES  Dietary changes reviewed: YES  Referrals reviewed: YES, will also see cardiology 09/03/18, GI appt still pending   Functional Questionnaire:   Activities of Daily Living (ADLs):   she states he are independent in the following: feeding, continence, grooming and toileting States they require assistance with the following: ambulation, bathing and hygiene and dressing   Any transportation issues/concerns?: NO   Any patient concerns? NO   Confirmed importance and date/time of follow-up visits scheduled YES, appt 09/03/18 Provider Appointment booked with Dr. [name]  Confirmed with patient if condition begins to worsen call PCP or go to the ER.  Patient was given the office number and encouraged to call back with question or concerns.  : YES

## 2018-09-02 DIAGNOSIS — R7881 Bacteremia: Secondary | ICD-10-CM | POA: Diagnosis not present

## 2018-09-02 DIAGNOSIS — N2581 Secondary hyperparathyroidism of renal origin: Secondary | ICD-10-CM | POA: Diagnosis not present

## 2018-09-02 DIAGNOSIS — E119 Type 2 diabetes mellitus without complications: Secondary | ICD-10-CM | POA: Diagnosis not present

## 2018-09-02 DIAGNOSIS — D631 Anemia in chronic kidney disease: Secondary | ICD-10-CM | POA: Diagnosis not present

## 2018-09-02 DIAGNOSIS — D509 Iron deficiency anemia, unspecified: Secondary | ICD-10-CM | POA: Diagnosis not present

## 2018-09-02 DIAGNOSIS — N186 End stage renal disease: Secondary | ICD-10-CM | POA: Diagnosis not present

## 2018-09-03 ENCOUNTER — Encounter: Payer: Self-pay | Admitting: Internal Medicine

## 2018-09-03 ENCOUNTER — Ambulatory Visit (INDEPENDENT_AMBULATORY_CARE_PROVIDER_SITE_OTHER): Payer: Medicare Other | Admitting: Internal Medicine

## 2018-09-03 ENCOUNTER — Ambulatory Visit: Payer: Medicare Other | Admitting: Cardiology

## 2018-09-03 VITALS — BP 122/72 | HR 96 | Temp 98.9°F | Ht 72.0 in | Wt 216.0 lb

## 2018-09-03 DIAGNOSIS — Z9861 Coronary angioplasty status: Secondary | ICD-10-CM

## 2018-09-03 DIAGNOSIS — Z87891 Personal history of nicotine dependence: Secondary | ICD-10-CM

## 2018-09-03 DIAGNOSIS — E039 Hypothyroidism, unspecified: Secondary | ICD-10-CM

## 2018-09-03 DIAGNOSIS — A0472 Enterocolitis due to Clostridium difficile, not specified as recurrent: Secondary | ICD-10-CM

## 2018-09-03 DIAGNOSIS — D631 Anemia in chronic kidney disease: Secondary | ICD-10-CM | POA: Diagnosis not present

## 2018-09-03 DIAGNOSIS — E1142 Type 2 diabetes mellitus with diabetic polyneuropathy: Secondary | ICD-10-CM | POA: Diagnosis not present

## 2018-09-03 DIAGNOSIS — I959 Hypotension, unspecified: Secondary | ICD-10-CM | POA: Diagnosis not present

## 2018-09-03 DIAGNOSIS — Z8546 Personal history of malignant neoplasm of prostate: Secondary | ICD-10-CM

## 2018-09-03 DIAGNOSIS — M48062 Spinal stenosis, lumbar region with neurogenic claudication: Secondary | ICD-10-CM

## 2018-09-03 DIAGNOSIS — M25532 Pain in left wrist: Secondary | ICD-10-CM | POA: Diagnosis not present

## 2018-09-03 DIAGNOSIS — M109 Gout, unspecified: Secondary | ICD-10-CM | POA: Diagnosis not present

## 2018-09-03 DIAGNOSIS — Z8701 Personal history of pneumonia (recurrent): Secondary | ICD-10-CM

## 2018-09-03 DIAGNOSIS — R7881 Bacteremia: Secondary | ICD-10-CM | POA: Diagnosis not present

## 2018-09-03 DIAGNOSIS — N186 End stage renal disease: Secondary | ICD-10-CM

## 2018-09-03 DIAGNOSIS — F0280 Dementia in other diseases classified elsewhere without behavioral disturbance: Secondary | ICD-10-CM

## 2018-09-03 DIAGNOSIS — I5082 Biventricular heart failure: Secondary | ICD-10-CM | POA: Diagnosis not present

## 2018-09-03 DIAGNOSIS — I255 Ischemic cardiomyopathy: Secondary | ICD-10-CM

## 2018-09-03 DIAGNOSIS — I132 Hypertensive heart and chronic kidney disease with heart failure and with stage 5 chronic kidney disease, or end stage renal disease: Secondary | ICD-10-CM | POA: Diagnosis not present

## 2018-09-03 DIAGNOSIS — I2510 Atherosclerotic heart disease of native coronary artery without angina pectoris: Secondary | ICD-10-CM | POA: Diagnosis not present

## 2018-09-03 DIAGNOSIS — M5412 Radiculopathy, cervical region: Secondary | ICD-10-CM

## 2018-09-03 DIAGNOSIS — J449 Chronic obstructive pulmonary disease, unspecified: Secondary | ICD-10-CM | POA: Diagnosis not present

## 2018-09-03 DIAGNOSIS — Z9181 History of falling: Secondary | ICD-10-CM

## 2018-09-03 DIAGNOSIS — I5042 Chronic combined systolic (congestive) and diastolic (congestive) heart failure: Secondary | ICD-10-CM | POA: Diagnosis not present

## 2018-09-03 DIAGNOSIS — R1310 Dysphagia, unspecified: Secondary | ICD-10-CM

## 2018-09-03 DIAGNOSIS — N400 Enlarged prostate without lower urinary tract symptoms: Secondary | ICD-10-CM

## 2018-09-03 DIAGNOSIS — E1122 Type 2 diabetes mellitus with diabetic chronic kidney disease: Secondary | ICD-10-CM | POA: Diagnosis not present

## 2018-09-03 DIAGNOSIS — F329 Major depressive disorder, single episode, unspecified: Secondary | ICD-10-CM

## 2018-09-03 DIAGNOSIS — Z992 Dependence on renal dialysis: Secondary | ICD-10-CM

## 2018-09-03 DIAGNOSIS — I252 Old myocardial infarction: Secondary | ICD-10-CM | POA: Diagnosis not present

## 2018-09-03 DIAGNOSIS — E1151 Type 2 diabetes mellitus with diabetic peripheral angiopathy without gangrene: Secondary | ICD-10-CM | POA: Diagnosis not present

## 2018-09-03 DIAGNOSIS — Z96659 Presence of unspecified artificial knee joint: Secondary | ICD-10-CM

## 2018-09-03 MED ORDER — METHYLPREDNISOLONE ACETATE 80 MG/ML IJ SUSP
80.0000 mg | Freq: Once | INTRAMUSCULAR | Status: AC
  Administered 2018-09-03: 80 mg via INTRAMUSCULAR

## 2018-09-03 NOTE — Assessment & Plan Note (Signed)
Improved, stable, to finish oral vanc

## 2018-09-03 NOTE — Patient Instructions (Addendum)
You had the steroid shot today  Please continue all other medications as before, and refills have been done if requested.  Please have the pharmacy call with any other refills you may need.  Please continue your efforts at being more active, low cholesterol diet, and weight control.  Please keep your appointments with your specialists as you may have planned  Please return in 6 months, or sooner if needed

## 2018-09-03 NOTE — Progress Notes (Signed)
Subjective:    Patient ID: [name], male    DOB: 21-Jun-1951, 67 y.o.   MRN: [id number]  HPI  Here after recent hospn 11/19 - 12/1 with septic shock, klebsiella bacteremia ? GI source, c diff positive and episode of transient gout left wrist, to f/u with renal, PCP, ID and GI.  Finishing oral vanc tab until dec 15.  Has f/u with GI on Friday later this wk, also ID dec 13, cont HD tues-thur sat.  Overall doing ok, Denies worsening reflux, abd pain, dysphagia, n/v, bowel change or blood.  Card appt earlier today cancelled per cards as was not needed to f/u.  HH to be resumed soon at home, now home since Dec 1.   Pt denies fever, wt loss, night sweats, loss of appetite, or other constitutional symptoms  Pt denies chest pain, increased sob or doe, wheezing, orthopnea, PND, increased LE swelling, palpitations, dizziness or syncope.  Pt denies new neurological symptoms such as new headache, or facial or extremity weakness or numbness   Past Medical History:  Diagnosis Date  . Allergic rhinitis, cause unspecified 02/24/2014  . Anemia 06/16/2011  . BENIGN PROSTATIC HYPERTROPHY 10/14/2009  . CAD, NATIVE VESSEL 02/06/2009   a. 06/2007 s/p Taxus DES to the RCA;  b. 08/2016 NSTEMI in setting of SVT/PCI: LM 30ost, LAD 84m(3.0x16 Synergy DES), LCX 472mOM1 60, RI 40, RCA 70p/m, 8072mnot amenable to PCI.  . CMarland Kitchenrvical radiculopathy, chronic 02/23/2016   Right c5-6 by NCS/EMG  . Chronic combined systolic (congestive) and diastolic (congestive) heart failure (HCCOj[place]  a. 10/2016 Echo: EF 40-45%, Gr2 DD. mildly dil LA.  . CMarland KitchenLONIC POLYPS, HX OF 10/14/2009  . Dementia (HCCNorth [id number] Depression 09/24/2015  . DIABETES MELLITUS, TYPE II 02/01/2010  . DYSLIPIDEMIA 06/18/2007  . ESRD (end stage renal disease) on dialysis (HCCLavaca1/12/2009   ESRD due to DM adn HTN, started HD in 2011. As of 2019 has a R arm graft and gets HD on TTS sched  . FOOT PAIN 08/12/2008  . GAIT DISTURBANCE 03/03/2010  . GASTROENTERITIS, VIRAL  10/14/2009  . GERD 06/18/2007  . GOITER, MULTINODULAR 12/26/2007  . GOUT 06/18/2007  . GYNECOMASTIA 07/17/2010  . Hemodialysis access, fistula mature ([facility]  [facility] ([place] upper arm fistula  . Hyperlipidemia 10/16/2011  . Hyperparathyroidism, secondary (HCC[place]ris City/15/2012  . HYPERTENSION 06/18/2007  . Hyperthyroidism   . Hypocalcemia 06/07/2010  . Ischemic cardiomyopathy    a. 10/2016 Echo: EF 40-45%.  . Lumbar stenosis with neurogenic claudication   . ONYCHOMYCOSIS, TOENAILS 12/26/2007  . OSA on CPAP 10/16/2011  . Other malaise and fatigue 11/24/2009  . PERIPHERAL NEUROPATHY 06/18/2007  . Prostate cancer (HCC[place] . PSVT (paroxysmal supraventricular tachycardia) (HCCFranklin  a. 11/69/4854mplicated by NSTEMI;  b. 11/2016 Treated w/ adenosine in ED;  c. 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  . PMarland KitchenLMONARY NODULE, RIGHT LOWER LOBE 06/08/2009  . Sleep apnea    cpap machine and o2  . TRANSAMINASES, SERUM, ELEVATED 02/01/2010  . Transfusion history    none recent  . Unspecified hypotension 01/30/2010   Past Surgical History:  Procedure Laterality Date  . A/V SHUNTOGRAM N/A 04/07/2018   Procedure: A/V SHUNTOGRAM;  Surgeon: CaiWaynetta SandyD;  [facility];  Service: Cardiovascular;  Laterality: N/A;  . ARTERIOVENOUS GRAFT PLACEMENT Right 2009   forearm/notes 02/01/2011  . AV FISTULA PLACEMENT  11/07/2011  Procedure: INSERTION OF ARTERIOVENOUS (AV) GORE-TEX GRAFT ARM;  Surgeon: Tinnie Gens, MD;  Location: [place];  Service: Vascular;  Laterality: Left;  . BACK SURGERY  1998  . BASCILIC VEIN TRANSPOSITION Right 02/27/2013   Procedure: BASCILIC VEIN TRANSPOSITION;  Surgeon: Mal Misty, MD;  Location: [place];  Service: Vascular;  Laterality: Right;  Right Basilic Vein Transposition   . CARDIAC CATHETERIZATION N/A 08/06/2016   Procedure: Left Heart Cath and Coronary Angiography;  Surgeon: Jolaine Artist, MD;  Location: [place] CV LAB;  Service:  Cardiovascular;  Laterality: N/A;  . CARDIAC CATHETERIZATION N/A 08/07/2016   Procedure: Coronary/Graft Atherectomy-CSI LAD;  Surgeon: Peter M Martinique, MD;  Location: [place] [facility] LAB;  Service: Cardiovascular;  Laterality: N/A;  . CERVICAL SPINE SURGERY  2/09   "to repair nerve problems in my left arm"  . CHOLECYSTECTOMY    . COLONOSCOPY WITH PROPOFOL N/A 04/26/2017   Procedure: COLONOSCOPY WITH PROPOFOL;  Surgeon: Otis Brace, MD;  Location: [place];  Service: Gastroenterology;  Laterality: N/A;  . CORONARY ANGIOPLASTY WITH STENT PLACEMENT  06/11/2008  . CORONARY ANGIOPLASTY WITH STENT PLACEMENT  06/2007   TAXUS stent to RCA/notes 01/31/2011  . ESOPHAGOGASTRODUODENOSCOPY  09/28/2011   Procedure: ESOPHAGOGASTRODUODENOSCOPY (EGD);  Surgeon: Missy Sabins, MD;  Location[facility] ENDOSCOPY;  Service: Endoscopy;  Laterality: N/A;  . ESOPHAGOGASTRODUODENOSCOPY N/A 04/07/2015   Procedure: ESOPHAGOGASTRODUODENOSCOPY (EGD);  Surgeon: Teena Irani, MD;  Location: [facility];  Service: Endoscopy;  Laterality: N/A;  . ESOPHAGOGASTRODUODENOSCOPY N/A 04/19/2015   Procedure: ESOPHAGOGASTRODUODENOSCOPY (EGD);  Surgeon: Arta Silence, MD;  Location: [facility] ENDOSCOPY;  Service: Endoscopy;  Laterality: N/A;  . ESOPHAGOGASTRODUODENOSCOPY (EGD) WITH PROPOFOL N/A 06/13/2018   Procedure: ESOPHAGOGASTRODUODENOSCOPY (EGD) WITH PROPOFOL;  Surgeon: Wonda Horner, MD;  Location[facility] ENDOSCOPY;  Service: Endoscopy;  Laterality: N/A;  . FLEXIBLE SIGMOIDOSCOPY N/A 05/21/2017   Procedure: Conni Elliot;  Surgeon: Clarene Essex, MD;  Location: [place];  Service: Endoscopy;  Laterality: N/A;  . FLEXIBLE SIGMOIDOSCOPY Left 07/02/2017   Procedure: FLEXIBLE SIGMOIDOSCOPY;  Surgeon: Laurence Spates, MD;  Location: [place];  Service: Endoscopy;  Laterality: Left;  . FOREIGN BODY REMOVAL  09/2003   via upper endoscopy/notes 02/12/2011  . GIVENS CAPSULE STUDY  09/30/2011   Procedure: GIVENS CAPSULE STUDY;  Surgeon:  Jeryl Columbia, MD;  Location: [facility] ENDOSCOPY;  Service: Endoscopy;  Laterality: N/A;  . INSERTION OF DIALYSIS CATHETER Right 2014  . INSERTION OF DIALYSIS CATHETER Left 02/11/2013   Procedure: INSERTION OF DIALYSIS CATHETER;  Surgeon: Conrad [place], MD;  Location: [place];  Service: Vascular;  Laterality: Left;  Ultrasound guided  . LUMBAR LAMINECTOMY/DECOMPRESSION MICRODISCECTOMY Bilateral 07/31/2017   Procedure: LAMINECTOMY AND FORAMINOTOMY- BILATERAL LUMBAR TWO- LUMBAR THREE;  Surgeon: Earnie Larsson, MD;  Location: [place] [place];  Service: Neurosurgery;  Laterality: Bilateral;  LAMINECTOMY AND FORAMINOTOMY- BILATERAL LUMBAR 2- LUMBAR 3  . PERIPHERAL VASCULAR BALLOON ANGIOPLASTY  04/07/2018   Procedure: PERIPHERAL VASCULAR BALLOON ANGIOPLASTY;  Surgeon: Waynetta Sandy, MD;  Location: [place] CV LAB;  Service: Cardiovascular;;  RUE AVF  . REMOVAL OF A DIALYSIS CATHETER Right 02/11/2013   Procedure: REMOVAL OF A DIALYSIS CATHETER;  Surgeon: Conrad Norway, MD;  Location: [place];  Service: Vascular;  Laterality: Right;  . SAVORY DILATION N/A 04/07/2015   Procedure: SAVORY DILATION;  Surgeon: Teena Irani, MD;  Location: [facility] ENDOSCOPY;  Service: Endoscopy;  Laterality: N/A;  . SHUNTOGRAM N/A 09/20/2011   Procedure: Earney Mallet;  Surgeon: Conrad [place], MD;  Location: [facility] CATH  LAB;  Service: Cardiovascular;  Laterality: N/A;  . SVT ABLATION N/A 11/26/2016   Procedure: SVT Ablation;  Surgeon: Will Meredith Leeds, MD;  Location: [place] CV LAB;  Service: Cardiovascular;  Laterality: N/A;  . TEE WITHOUT CARDIOVERSION N/A 08/25/2018   Procedure: TRANSESOPHAGEAL ECHOCARDIOGRAM (TEE);  Surgeon: Dorothy Spark, MD;  Location: [facility] ENDOSCOPY;  Service: Cardiovascular;  Laterality: N/A;  . TONSILLECTOMY    . TOTAL KNEE ARTHROPLASTY Right 08/02/2015   Procedure: TOTAL KNEE ARTHROPLASTY;  Surgeon: Renette Butters, MD;  Location: [place];  Service: Orthopedics;  Laterality: Right;  . VENOGRAM N/A 01/26/2013   Procedure:  VENOGRAM;  Surgeon: Angelia Mould, MD;  Location: [facility] CATH LAB;  Service: Cardiovascular;  Laterality: N/A;    reports that he quit smoking about 12 years ago. His smoking use included cigarettes. He has a 25.00 pack-year smoking history. He quit smokeless tobacco use about 12 years ago. He reports that he does not drink alcohol or use drugs. family history includes Diabetes in his father; Healthy in his child, child, and child; Heart disease in his father and sister; Hypertension in his father; Kidney failure in his father; Thyroid nodules in his sister. Allergies  Allergen Reactions  . Cephalexin Swelling and Other (See Comments)    Tolerated Rocephin 10/19. Tongue swelling, but no breathing issues Tolerated Zosyn in Sept 2019 without any issues  . Statins Other (See Comments)    Weak muscles  . Ciprofloxacin Rash   Current Outpatient Medications on File Prior to Visit  Medication Sig Dispense Refill  . acetaminophen (TYLENOL) 325 MG tablet Take 650 mg by mouth every 6 (six) hours as needed for mild pain or headache.     . Alirocumab (PRALUENT) 150 MG/ML SOPN Inject 150 mg into the skin every 14 (fourteen) days. 2 pen 12  . allopurinol (ZYLOPRIM) 100 MG tablet TAKE ONE TABLET BY MOUTH ONCE DAILY (Patient taking differently: Take 100 mg by mouth daily. ) 90 tablet 3  . aspirin EC 81 MG tablet Take 81 mg by mouth daily.    . betamethasone dipropionate (DIPROLENE) 0.05 % cream Apply 1 application topically daily as needed (leg sores).    . cefTAZidime (FORTAZ) IVPB Inject 2 g into the vein Every Tuesday,Thursday,and Saturday with dialysis. Indication: bacteremia Last Day of Therapy: 09/04/2018 Labs - Once weekly:  CBC/D and BMP, Labs - Every other week:  ESR and CRP 10 Units 0  . citalopram (CELEXA) 10 MG tablet Take 1 tablet (10 mg total) by mouth at bedtime. 90 tablet 3  . colchicine 0.6 MG tablet Take 1 tablet (0.6 mg total) by mouth daily. 90 tablet 3  . doxercalciferol ([name])  4 MCG/2ML injection Inject 1 mL (2 mcg total) into the vein Every Tuesday,Thursday,and Saturday with dialysis. 2 mL 0  . ezetimibe (ZETIA) 10 MG tablet TAKE ONE TABLET BY MOUTH ONCE DAILY (Patient taking differently: Take 10 mg by mouth daily. ) 90 tablet 3  . famotidine (PEPCID) 20 MG tablet Take 1 tablet (20 mg total) by mouth daily. 30 tablet 0  . ferric citrate (AURYXIA) 1 GM 210 MG(Fe) tablet Take 2 tablets (420 mg total) by mouth 3 (three) times daily with meals. 60 tablet 0  . gabapentin (NEURONTIN) 100 MG capsule Take 1 capsule (100 mg total) by mouth at bedtime. 30 capsule 0  . hydroxypropyl methylcellulose / hypromellose (ISOPTO TEARS / GONIOVISC) 2.5 % ophthalmic solution Place 1 drop into both eyes 3 (three) times daily as needed for  dry eyes. 15 mL 11  . methimazole (TAPAZOLE) 5 MG tablet Take 0.5 tablets (2.5 mg total) by mouth daily. 45 tablet 3  . multivitamin (RENA-VIT) TABS tablet Take 1 tablet by mouth daily. Reported on 10/24/2015    . Nutritional Supplements (FEEDING SUPPLEMENT, NEPRO CARB STEADY,) LIQD Take 237 mLs by mouth 2 (two) times daily between meals. 7 Can 0  . polyethylene glycol (MIRALAX / GLYCOLAX) packet Take 17 g by mouth daily. (Patient taking differently: Take 17 g by mouth daily as needed for mild constipation. ) 14 each 0  . SENSIPAR 90 MG tablet Take 90 mg by mouth 2 (two) times daily.    . vancomycin (VANCOCIN) 125 MG capsule Take 1 capsule (125 mg total) by mouth 4 (four) times daily for 14 days. 56 capsule 0  . WELCHOL 625 MG tablet TAKE THREE TABLETS BY MOUTH TWICE DAILY WITH MEALS (Patient taking differently: Take 1,875 mg by mouth 2 (two) times daily with a meal. ) 180 tablet 11  . zolpidem (AMBIEN) 5 MG tablet Take 1 tablet (5 mg total) by mouth at bedtime as needed. for sleep (Patient taking differently: Take 5 mg by mouth at bedtime as needed for sleep. ) 90 tablet 1   No current facility-administered medications on file prior to visit.    Review of  Systems  Constitutional: Negative for other unusual diaphoresis or sweats HENT: Negative for ear discharge or swelling Eyes: Negative for other worsening visual disturbances Respiratory: Negative for stridor or other swelling  Gastrointestinal: Negative for worsening distension or other blood Genitourinary: Negative for retention or other urinary change Musculoskeletal: Negative for other MSK pain or swelling Skin: Negative for color change or other new lesions Neurological: Negative for worsening tremors and other numbness  Psychiatric/Behavioral: Negative for worsening agitation or other fatigue All other system neg per pt    Objective:   Physical Exam BP 122/72 (BP Location: Left Arm, Patient Position: Sitting, Cuff Size: Large)   Pulse 96   Temp 98.9 F (37.2 C) (Oral)   Ht 6' (1.829 m)   Wt 216 lb (98 kg)   SpO2 94%   BMI 29.29 kg/m  VS noted,  Constitutional: Pt appears in NAD HENT: Head: NCAT.  Right Ear: External ear normal.  Left Ear: External ear normal.  Eyes: . Pupils are equal, round, and reactive to light. Conjunctivae and EOM are normal Nose: without d/c or deformity Neck: Neck supple. Gross normal ROM Cardiovascular: Normal rate and regular rhythm.   Pulmonary/Chest: Effort normal and breath sounds without rales or wheezing.  Abd:  Soft, NT, ND, + BS, no organomegaly Neurological: Pt is alert. At baseline orientation, motor grossly intact Skin: Skin is warm. No rashes, other new lesions, no LE edema Psychiatric: Pt behavior is normal without agitation  No other exam findings  Lab Results  Component Value Date   WBC 6.2 08/30/2018   HGB 9.5 (L) 08/30/2018   HCT 31.3 (L) 08/30/2018   PLT 266 08/30/2018   GLUCOSE 154 (H) 08/30/2018   CHOL 159 08/08/2018   TRIG 133.0 08/08/2018   HDL 69.30 08/08/2018   LDLDIRECT 136.0 07/16/2014   LDLCALC 63 08/08/2018   ALT 46 (H) 08/20/2018   AST 39 08/20/2018   NA 139 08/30/2018   K 4.3 08/30/2018   CL 102  08/30/2018   CREATININE 9.53 (H) 08/30/2018   BUN 46 (H) 08/30/2018   CO2 23 08/30/2018   TSH 0.70 08/18/2018   PSA 3.67 08/30/2014  INR 1.15 08/19/2018   HGBA1C 5.7 (H) 07/16/2018         Assessment & Plan:

## 2018-09-03 NOTE — Assessment & Plan Note (Signed)
Presumed gout, mild persistent despite colchicine, for depomedrol IM 80 x 1 today

## 2018-09-03 NOTE — Assessment & Plan Note (Addendum)
Resolved, cont to follow, f/u GI and ID as recommended

## 2018-09-04 DIAGNOSIS — D509 Iron deficiency anemia, unspecified: Secondary | ICD-10-CM | POA: Diagnosis not present

## 2018-09-04 DIAGNOSIS — N186 End stage renal disease: Secondary | ICD-10-CM | POA: Diagnosis not present

## 2018-09-04 DIAGNOSIS — E119 Type 2 diabetes mellitus without complications: Secondary | ICD-10-CM | POA: Diagnosis not present

## 2018-09-04 DIAGNOSIS — D631 Anemia in chronic kidney disease: Secondary | ICD-10-CM | POA: Diagnosis not present

## 2018-09-04 DIAGNOSIS — R7881 Bacteremia: Secondary | ICD-10-CM | POA: Diagnosis not present

## 2018-09-04 DIAGNOSIS — N2581 Secondary hyperparathyroidism of renal origin: Secondary | ICD-10-CM | POA: Diagnosis not present

## 2018-09-04 LAB — CULTURE, BLOOD (ROUTINE X 2)
Culture: NO GROWTH
Culture: NO GROWTH
Special Requests: ADEQUATE
Special Requests: ADEQUATE

## 2018-09-05 ENCOUNTER — Other Ambulatory Visit: Payer: Self-pay | Admitting: *Deleted

## 2018-09-05 DIAGNOSIS — D649 Anemia, unspecified: Secondary | ICD-10-CM | POA: Diagnosis not present

## 2018-09-05 NOTE — Addendum Note (Signed)
Addended byValente David on: 09/05/2018 11:15 AM   Modules accepted: Orders

## 2018-09-05 NOTE — Patient Outreach (Signed)
[facility]) Care Management  09/05/2018  ZACCHEAUS STORLIE Jan 09, 1951 [id number]   Call placed to member's caregiver/wife [name] to follow up on member's current health status and attempt to schedule home visit (no call back on 12/2).  Son report she is not available, request made for call back when available.  Will follow up within the next 4 business days.  [name], [place], MSN [place] [phone]

## 2018-09-06 DIAGNOSIS — D509 Iron deficiency anemia, unspecified: Secondary | ICD-10-CM | POA: Diagnosis not present

## 2018-09-06 DIAGNOSIS — R7881 Bacteremia: Secondary | ICD-10-CM | POA: Diagnosis not present

## 2018-09-06 DIAGNOSIS — N186 End stage renal disease: Secondary | ICD-10-CM | POA: Diagnosis not present

## 2018-09-06 DIAGNOSIS — E119 Type 2 diabetes mellitus without complications: Secondary | ICD-10-CM | POA: Diagnosis not present

## 2018-09-06 DIAGNOSIS — N2581 Secondary hyperparathyroidism of renal origin: Secondary | ICD-10-CM | POA: Diagnosis not present

## 2018-09-06 DIAGNOSIS — D631 Anemia in chronic kidney disease: Secondary | ICD-10-CM | POA: Diagnosis not present

## 2018-09-08 ENCOUNTER — Encounter: Payer: Self-pay | Admitting: *Deleted

## 2018-09-08 ENCOUNTER — Other Ambulatory Visit: Payer: Self-pay | Admitting: *Deleted

## 2018-09-08 ENCOUNTER — Telehealth: Payer: Self-pay | Admitting: Internal Medicine

## 2018-09-08 DIAGNOSIS — N186 End stage renal disease: Secondary | ICD-10-CM | POA: Diagnosis not present

## 2018-09-08 DIAGNOSIS — E1122 Type 2 diabetes mellitus with diabetic chronic kidney disease: Secondary | ICD-10-CM | POA: Diagnosis not present

## 2018-09-08 DIAGNOSIS — I5042 Chronic combined systolic (congestive) and diastolic (congestive) heart failure: Secondary | ICD-10-CM | POA: Diagnosis not present

## 2018-09-08 DIAGNOSIS — I132 Hypertensive heart and chronic kidney disease with heart failure and with stage 5 chronic kidney disease, or end stage renal disease: Secondary | ICD-10-CM | POA: Diagnosis not present

## 2018-09-08 DIAGNOSIS — I5082 Biventricular heart failure: Secondary | ICD-10-CM | POA: Diagnosis not present

## 2018-09-08 DIAGNOSIS — D631 Anemia in chronic kidney disease: Secondary | ICD-10-CM | POA: Diagnosis not present

## 2018-09-08 NOTE — Telephone Encounter (Signed)
Verbal orders given via VM 

## 2018-09-08 NOTE — Patient Outreach (Signed)
[facility] [facility]) Care Management  09/08/2018  ITZAE MIRALLES 11-Nov-1950 [id number]   CSW was able to make initial contact with patient's wife, Larue Lightner today to perform the initial phone assessment, as well as assess and assist with social work needs and services.  CSW introduced self, explained role and types of services provided through [place] [facility] ([place] [facility]).  CSW further explained to Mrs. Alia that CSW works with patient's RNCM, also with [place] [facility], [name]. CSW then explained the reason for the call, indicating that Mrs. Orene Desanctis thought that Mrs. Lowder would benefit from social work services and resources to help cope with caregiver burnout and resources to increase support in the home.  CSW obtained two HIPAA compliant identifiers from Mrs. Loveall, which included patient's name and date of birth.  Mrs. Stave stated, "Edgerrin has been sick for a long time and I have always been in his caregiver, so this is nothing new to me".  CSW explained to Mrs. Stockard that CSW is more than happy to come out to her home to provide counseling and supportive services, as well as resources to help her cope, but Mrs. Piekarski denied, indicating that she is "hanging in there".  Mrs. Marney went on to say that she dealt with Major Depression several years ago and that she is already aware of all the resources that can be offered to her.  Mrs. Achterberg was very appreciative of the call and agreed to take down CSW's contact information, in the event that she changes her mind.  Mrs. Knutzen did not wish to receive a list of in-home care agencies that can assist with caregiver services for patient in the home.  CSW will perform a case closure on patient, as Mrs. Schneck reported that she is not interested in receiving social work services through [place] with [place] [facility] at this time.  CSW will notify patient's RNCM with [place] [facility], [name] of CSW's plans to close patient's case.  CSW provided Mrs. Endsley with Mrs. Windy Fast contact information, per her request, as she indicated that she needed to get back in contact with Mrs. Collison to schedule a time for her to come out to perform a home visit with patient.  CSW will fax an update to patient's Primary Care Physician, Dr. [name] to ensure that they are aware of CSW's involvement with patient's plan of care.    Nat Christen, BSW, MSW, LCSW  Licensed Education officer, [facility]  Mailing [address] [place], [place] [address] Physical Address-[address], [place], [place] [address] Toll Free Main # [phone] Fax # [phone] Cell # [phone]  Office # [phone] [email or web address]

## 2018-09-08 NOTE — Telephone Encounter (Signed)
Copied from [place] [id number]. Topic: Quick Communication - Home Health Verbal Orders >> Sep 08, 2018  4:08 PM Blase Mess A wrote: Caller/Agency: [name] Callback Number: [phone] [facility] verbal to VM Requesting OT/PT/Skilled Nursing/Social Work: PT Frequency: 2x a week for 6 weeks

## 2018-09-09 DIAGNOSIS — E119 Type 2 diabetes mellitus without complications: Secondary | ICD-10-CM | POA: Diagnosis not present

## 2018-09-09 DIAGNOSIS — D631 Anemia in chronic kidney disease: Secondary | ICD-10-CM | POA: Diagnosis not present

## 2018-09-09 DIAGNOSIS — N186 End stage renal disease: Secondary | ICD-10-CM | POA: Diagnosis not present

## 2018-09-09 DIAGNOSIS — N2581 Secondary hyperparathyroidism of renal origin: Secondary | ICD-10-CM | POA: Diagnosis not present

## 2018-09-09 DIAGNOSIS — R7881 Bacteremia: Secondary | ICD-10-CM | POA: Diagnosis not present

## 2018-09-09 DIAGNOSIS — D509 Iron deficiency anemia, unspecified: Secondary | ICD-10-CM | POA: Diagnosis not present

## 2018-09-10 ENCOUNTER — Other Ambulatory Visit: Payer: Self-pay | Admitting: *Deleted

## 2018-09-10 DIAGNOSIS — E1122 Type 2 diabetes mellitus with diabetic chronic kidney disease: Secondary | ICD-10-CM | POA: Diagnosis not present

## 2018-09-10 DIAGNOSIS — I132 Hypertensive heart and chronic kidney disease with heart failure and with stage 5 chronic kidney disease, or end stage renal disease: Secondary | ICD-10-CM | POA: Diagnosis not present

## 2018-09-10 DIAGNOSIS — D631 Anemia in chronic kidney disease: Secondary | ICD-10-CM | POA: Diagnosis not present

## 2018-09-10 DIAGNOSIS — N186 End stage renal disease: Secondary | ICD-10-CM | POA: Diagnosis not present

## 2018-09-10 DIAGNOSIS — I5082 Biventricular heart failure: Secondary | ICD-10-CM | POA: Diagnosis not present

## 2018-09-10 DIAGNOSIS — I5042 Chronic combined systolic (congestive) and diastolic (congestive) heart failure: Secondary | ICD-10-CM | POA: Diagnosis not present

## 2018-09-10 NOTE — Patient Outreach (Signed)
[facility] [facility]) Care Management  09/10/2018  [name] 1951-02-04 [id number]   2nd attempt made to contact member's caregiver/wife, no answer.  Unable to leave voice message as mailbox is full.  Unsuccessful outreach letter sent, will follow up with 3rd attempt within the next 4 business days.  [name], [place], MSN [place] [phone]

## 2018-09-11 ENCOUNTER — Other Ambulatory Visit: Payer: Self-pay | Admitting: *Deleted

## 2018-09-11 ENCOUNTER — Other Ambulatory Visit: Payer: Self-pay | Admitting: Pharmacy Technician

## 2018-09-11 DIAGNOSIS — E119 Type 2 diabetes mellitus without complications: Secondary | ICD-10-CM | POA: Diagnosis not present

## 2018-09-11 DIAGNOSIS — N186 End stage renal disease: Secondary | ICD-10-CM | POA: Diagnosis not present

## 2018-09-11 DIAGNOSIS — D631 Anemia in chronic kidney disease: Secondary | ICD-10-CM | POA: Diagnosis not present

## 2018-09-11 DIAGNOSIS — R7881 Bacteremia: Secondary | ICD-10-CM | POA: Diagnosis not present

## 2018-09-11 DIAGNOSIS — N2581 Secondary hyperparathyroidism of renal origin: Secondary | ICD-10-CM | POA: Diagnosis not present

## 2018-09-11 DIAGNOSIS — D509 Iron deficiency anemia, unspecified: Secondary | ICD-10-CM | POA: Diagnosis not present

## 2018-09-11 NOTE — Patient Outreach (Signed)
[facility] [facility] [facility]) Care Management  09/11/2018  MARCELLAS MARCHANT [birth year] [id number]   Multidisciplinary discussion complete regarding less than 30 day readmission. Team made aware that this care manager has attempted to follow up twice with member/wife since discharge, both times unsuccessful.  Team also made aware that this care manager has had 2 home visit scheduled that were canceled.  Will make 3rd attempt to follow up with member/wife within the next week per workflow.  [name], [place], MSN [place] [phone]

## 2018-09-11 NOTE — Patient Outreach (Addendum)
[facility]) Care Management  09/11/2018  [name] 1951-06-02 [id number]   Successful outreach to patients wife, [name], [place] identifiers verified. [name] states that she is unable to confirm if patient assistance application has been received. Patient has been in the hospital and she had other family members checking the mail for her. She states that she will look for it. I suggested that if she was not able to locate it to verify the co-pay for the generic Zetia thru his insurance and contact me if it still hard for them to afford. She stated understanding.  Will remove myself from Care Team due to no longer proceeding with patient assistance.  Maud Deed Chana Bode [place] Certified Pharmacy Technician [place] Management Direct Dial:[phone]

## 2018-09-12 ENCOUNTER — Ambulatory Visit (INDEPENDENT_AMBULATORY_CARE_PROVIDER_SITE_OTHER): Payer: Medicare Other | Admitting: Infectious Diseases

## 2018-09-12 VITALS — BP 143/68 | HR 89 | Temp 97.6°F | Ht 72.0 in | Wt 216.0 lb

## 2018-09-12 DIAGNOSIS — A0472 Enterocolitis due to Clostridium difficile, not specified as recurrent: Secondary | ICD-10-CM | POA: Diagnosis not present

## 2018-09-12 DIAGNOSIS — R5383 Other fatigue: Secondary | ICD-10-CM

## 2018-09-12 DIAGNOSIS — R7881 Bacteremia: Secondary | ICD-10-CM

## 2018-09-12 DIAGNOSIS — M25532 Pain in left wrist: Secondary | ICD-10-CM | POA: Diagnosis not present

## 2018-09-12 DIAGNOSIS — R5381 Other malaise: Secondary | ICD-10-CM

## 2018-09-12 MED ORDER — VANCOMYCIN HCL 125 MG PO CAPS: ORAL_CAPSULE | ORAL | 0 refills | Status: DC

## 2018-09-12 NOTE — Progress Notes (Signed)
Patient: [name]  DOB: November 08, 1950 MRN: [id number] PCP: Biagio Borg, MD  Referring Provider: [facility]   Patient Active Problem List   Diagnosis Date Noted  . Left wrist pain 09/03/2018  . C. difficile colitis 09/03/2018  . Altered mental status   . Gram-negative bacteremia   . Acute gouty arthritis 08/08/2018  . Sepsis ([place]) 07/16/2018  . Thrush 06/21/2018  . GI bleed 06/11/2018  . Recurrent falls 05/14/2018  . Chronic combined systolic (congestive) and diastolic (congestive) heart failure ([place]) 01/15/2018  . Abscess of left leg 10/14/2017  . Insomnia 10/14/2017  . Lumbar stenosis with neurogenic claudication 07/31/2017  . Lightheadedness   . Rectal bleeding   . Gait disorder 05/05/2017  . Chronic GI bleeding 04/22/2017  . Malaise and fatigue 12/27/2016  . Generalized weakness 12/27/2016  . History of PSVT (paroxysmal supraventricular tachycardia) 10/16/2016  . Abdominal pain 08/27/2016  . Abnormal findings on diagnostic imaging of liver and biliary tract 08/27/2016  . Bloating symptom 08/27/2016  . Hematochezia 08/27/2016  . Early satiety 08/27/2016  . Eructation 08/27/2016  . Esophageal ulcer 08/27/2016  . Flatulence, eructation and gas pain 08/27/2016  . General weakness 08/27/2016  . Skin sensation disturbance 08/27/2016  . Stricture of esophagus 08/27/2016  . Alternating constipation and diarrhea 08/16/2016  . Status post coronary artery stent placement   . ST segment depression 08/05/2016  . PAD (peripheral artery disease) ([place]) 06/18/2016  . Cervical radiculopathy, chronic 02/23/2016  . Memory loss 12/22/2015  . Left-sided weakness 12/22/2015  . Peripheral polyneuropathy 12/22/2015  . DM (diabetes mellitus) type II controlled with renal manifestation ([place]) 10/31/2015  . Nausea and vomiting 10/31/2015  . Malignant neoplasm of prostate ([place]) 10/24/2015  . Right leg pain 09/04/2015  . Chest pain 08/09/2015  . S/P right total knee replacement 08/09/2015   . Primary osteoarthritis of right knee 08/05/2015  . Neuropathy due to secondary diabetes mellitus ([place]) 08/05/2015  . Arthritis of knee 08/02/2015  . Headache 06/08/2015  . Melena 04/19/2015  . Acute blood loss anemia 04/18/2015  . Hyperthyroidism 03/11/2015  . Thigh pain 12/08/2014  . Nodule of right lung 09/14/2014  . PSA elevation 08/30/2014  . ESRD on hemodialysis ([place]) 08/16/2014  . Essential hypertension 08/16/2014  . Encounter for hemodialysis ([place]) 08/16/2014  . Allergic rhinitis 02/24/2014  . Elevated PSA 02/24/2014  . Vertigo 10/14/2013  . Cough 06/26/2013  . Numbness 05/15/2013  . Other complications due to renal dialysis device, implant, and graft 01/14/2013  . Right hand pain 03/28/2012  . Dysphagia 03/28/2012  . Hypogonadism, male 03/28/2012  . OSA (obstructive sleep apnea) 10/16/2011  . HLD (hyperlipidemia) 10/16/2011  . CAD S/P percutaneous coronary angioplasty 09/28/2011  . NSTEMI (non-ST elevated myocardial infarction) ([place]) 09/28/2011  . AVM (arteriovenous malformation) 09/27/2011  . Anemia associated with acute blood loss 09/26/2011  . Ischemic cardiomyopathy 06/16/2011  . Hyperparathyroidism, secondary ([place]) 06/16/2011  . Preventative health care 03/11/2011  . NECK PAIN 07/31/2010  . GYNECOMASTIA 07/17/2010  . DIZZINESS 07/17/2010  . GAIT DISTURBANCE 03/03/2010  . Thyrotoxicosis 02/02/2010  . Elevated levels of transaminase & lactic acid dehydrogenase 02/01/2010  . Other malaise and fatigue 11/24/2009  . Depression 10/14/2009  . BENIGN PROSTATIC HYPERTROPHY 10/14/2009  . COLONIC POLYPS, HX OF 10/14/2009  . Dementia ([place]) 09/02/2009  . PULMONARY NODULE, RIGHT LOWER LOBE 06/08/2009  . DYSPNEA 10/29/2008  . FOOT PAIN 08/12/2008  . ONYCHOMYCOSIS, TOENAILS 12/26/2007  . GOITER, MULTINODULAR 12/26/2007  . Gout 06/18/2007  .  Neuropathy ([place]) 06/18/2007  . Hypertensive heart disease with CHF (congestive heart failure) ([place]) 06/18/2007  .  Gastro-esophageal reflux disease without esophagitis 06/18/2007     Subjective:  CC:  Hospital follow up. Feeling "sick".   HPI:  [name] is a 67 y.o. male with past medical history significant for ESRD (HD T/Th/Sa), GI bleeding, thyrotoxicosis, neuropathy, hypertensive heart disease, GERD.   He was hospitalized 11/19 for recurrent sepsis related to [facility] pneumoniae bacteremia which unfortunately is the second time he has had this same isolate in his blood (first hospitalization w/o identifiable source, treated with 5 days of ceftriaxone IV followed by Omnicef x 10 days.) During current hospital stay work up of relapsing bacteremia revealed negative U/S of AVF, negative TEE. He was treated with 14 d ceftazidime every T/TH/Sa after HD with last dose 12/05. Unfortunately [name] has also acquired CDiff and required ongoing treatment for this with vancomycin 125 mg QID that was prescribed to continue beyond IV antibiotics x 10 days.   Since he has been discharged he feels unchanged even with antibiotics; he just "feels sick" and "feels like he still has what he had in the hospital". He has not felt worse since discharge but just overall weak, tired, poor appetite. He is working with physical therapy at home and they have been more concerned about his left wrist pain. [name] feels that this has been worsening over the last month now and is swollen, warm and tender requiring a splint. He uses a walker in the home and the wrist pain makes it difficult to complete his therapy. No fevers but has intermittent cold and hot spells. They have an appointment with his orthopedic team on Friday. Diarrhea has been ongoing; it is hard to get an exact and helpful estimate as to if this is worse/better/same in discussion. His wife feels it is better but the patient still describes "watery moments" daily. Estimate about quantity varies from smears to a few cups full. He is not eating much and reports abdominal  distention. They are not certain which doctor to take him to to feel better.   Review of Systems  Constitutional: Positive for chills and malaise/fatigue. Negative for diaphoresis and fever.  HENT: Negative for sore throat.   Eyes: Negative for blurred vision.  Respiratory: Positive for shortness of breath. Negative for cough and sputum production. Hemoptysis: with activity    Cardiovascular: Negative for chest pain, orthopnea and leg swelling.  Gastrointestinal: Positive for abdominal pain and diarrhea. Negative for melena, nausea and vomiting.  Genitourinary:       Anuric   Musculoskeletal: Positive for joint pain (left wrist ). Negative for myalgias.  Skin: Negative for rash.  Neurological: Positive for dizziness and weakness. Negative for headaches.    Past Medical History:  Diagnosis Date  . Allergic rhinitis, cause unspecified 02/24/2014  . Anemia 06/16/2011  . BENIGN PROSTATIC HYPERTROPHY 10/14/2009  . CAD, NATIVE VESSEL 02/06/2009   a. 06/2007 s/p Taxus DES to the RCA;  b. 08/2016 NSTEMI in setting of SVT/PCI: LM 30ost, LAD 16m(3.0x16 Synergy DES), LCX 423mOM1 60, RI 40, RCA 70p/m, 8070mnot amenable to PCI.  . CMarland Kitchenrvical radiculopathy, chronic 02/23/2016   Right c5-6 by NCS/EMG  . Chronic combined systolic (congestive) and diastolic (congestive) heart failure ([place]  a. 10/2016 Echo: EF 40-45%, Gr2 DD. mildly dil LA.  . CMarland KitchenLONIC POLYPS, HX OF 10/14/2009  . Dementia ([id number] Depression 09/24/2015  . DIABETES MELLITUS, TYPE  II 02/01/2010  . DYSLIPIDEMIA 06/18/2007  . ESRD (end stage renal disease) on dialysis ([place]) 08/04/2010   ESRD due to DM adn HTN, started HD in 2011. As of 2019 has a R arm graft and gets HD on TTS sched  . FOOT PAIN 08/12/2008  . GAIT DISTURBANCE 03/03/2010  . GASTROENTERITIS, VIRAL 10/14/2009  . GERD 06/18/2007  . GOITER, MULTINODULAR 12/26/2007  . GOUT 06/18/2007  . GYNECOMASTIA 07/17/2010  . Hemodialysis access, fistula mature [facility])    Dialysis  T-Th-Sa ([place]) Right upper arm fistula  . Hyperlipidemia 10/16/2011  . Hyperparathyroidism, secondary ([place]) 06/16/2011  . HYPERTENSION 06/18/2007  . Hyperthyroidism   . Hypocalcemia 06/07/2010  . Ischemic cardiomyopathy    a. 10/2016 Echo: EF 40-45%.  . Lumbar stenosis with neurogenic claudication   . ONYCHOMYCOSIS, TOENAILS 12/26/2007  . OSA on CPAP 10/16/2011  . Other malaise and fatigue 11/24/2009  . PERIPHERAL NEUROPATHY 06/18/2007  . Prostate cancer ([place])   . PSVT (paroxysmal supraventricular tachycardia) ([place])    a. 81/8299 complicated by NSTEMI;  b. 11/2016 Treated w/ adenosine in ED;  c. 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  [name] PULMONARY NODULE, RIGHT LOWER LOBE 06/08/2009  . Sleep apnea    cpap machine and o2  . TRANSAMINASES, SERUM, ELEVATED 02/01/2010  . Transfusion history    none recent  . Unspecified hypotension 01/30/2010    Outpatient Medications Prior to Visit  Medication Sig Dispense Refill  . acetaminophen (TYLENOL) 325 MG tablet Take 650 mg by mouth every 6 (six) hours as needed for mild pain or headache.     . Alirocumab (PRALUENT) 150 MG/ML SOPN Inject 150 mg into the skin every 14 (fourteen) days. 2 pen 12  . allopurinol (ZYLOPRIM) 100 MG tablet TAKE ONE TABLET BY MOUTH ONCE DAILY (Patient taking differently: Take 100 mg by mouth daily. ) 90 tablet 3  . aspirin EC 81 MG tablet Take 81 mg by mouth daily.    . betamethasone dipropionate (DIPROLENE) 0.05 % cream Apply 1 application topically daily as needed (leg sores).    . citalopram (CELEXA) 10 MG tablet Take 1 tablet (10 mg total) by mouth at bedtime. 90 tablet 3  . colchicine 0.6 MG tablet Take 1 tablet (0.6 mg total) by mouth daily. 90 tablet 3  . ferric citrate (AURYXIA) 1 GM 210 MG(Fe) tablet Take 2 tablets (420 mg total) by mouth 3 (three) times daily with meals. 60 tablet 0  . gabapentin (NEURONTIN) 100 MG capsule Take 1 capsule (100 mg total) by mouth at bedtime. 30 capsule 0  . hydroxypropyl  methylcellulose / hypromellose (ISOPTO TEARS / GONIOVISC) 2.5 % ophthalmic solution Place 1 drop into both eyes 3 (three) times daily as needed for dry eyes. 15 mL 11  . methimazole (TAPAZOLE) 5 MG tablet Take 0.5 tablets (2.5 mg total) by mouth daily. 45 tablet 3  . multivitamin (RENA-VIT) TABS tablet Take 1 tablet by mouth daily. Reported on 10/24/2015    . Nutritional Supplements (FEEDING SUPPLEMENT, NEPRO CARB STEADY,) LIQD Take 237 mLs by mouth 2 (two) times daily between meals. 7 Can 0  . pantoprazole (PROTONIX) 40 MG tablet Take 40 mg by mouth daily.    . SENSIPAR 90 MG tablet Take 90 mg by mouth 2 (two) times daily.    Earnestine Mealing 625 MG tablet TAKE THREE TABLETS BY MOUTH TWICE DAILY WITH MEALS (Patient taking differently: Take 1,875 mg by mouth 2 (two) times daily with a meal. ) 180 tablet 11  .  zolpidem (AMBIEN) 5 MG tablet Take 1 tablet (5 mg total) by mouth at bedtime as needed. for sleep (Patient taking differently: Take 5 mg by mouth at bedtime as needed for sleep. ) 90 tablet 1  . cefTAZidime (FORTAZ) IVPB Inject 2 g into the vein Every Tuesday,Thursday,and Saturday with dialysis. Indication: bacteremia Last Day of Therapy: 09/04/2018 Labs - Once weekly:  CBC/D and BMP, Labs - Every other week:  ESR and CRP (Patient not taking: Reported on 09/12/2018) 10 Units 0  . doxercalciferol ([name]) 4 MCG/2ML injection Inject 1 mL (2 mcg total) into the vein Every Tuesday,Thursday,and Saturday with dialysis. 2 mL 0  . ezetimibe (ZETIA) 10 MG tablet TAKE ONE TABLET BY MOUTH ONCE DAILY (Patient not taking: No sig reported) 90 tablet 3  . polyethylene glycol (MIRALAX / GLYCOLAX) packet Take 17 g by mouth daily. (Patient not taking: Reported on 09/12/2018) 14 each 0  . vancomycin (VANCOCIN) 125 MG capsule Take 1 capsule (125 mg total) by mouth 4 (four) times daily for 14 days. (Patient not taking: Reported on 09/12/2018) 56 capsule 0  . famotidine (PEPCID) 20 MG tablet Take 1 tablet (20 mg total) by  mouth daily. 30 tablet 0   No facility-administered medications prior to visit.      Allergies  Allergen Reactions  . Cephalexin Swelling and Other (See Comments)    Tolerated Rocephin 10/19. Tongue swelling, but no breathing issues Tolerated Zosyn in Sept 2019 without any issues  . Statins Other (See Comments)    Weak muscles  . Ciprofloxacin Rash    Social History   Tobacco Use  . Smoking status: Former Smoker    Packs/day: 1.00    Years: 25.00    Pack years: 25.00    Types: Cigarettes    Last attempt to quit: 10/01/2005    Years since quitting: 12.9  . Smokeless tobacco: Former Systems developer    Quit date: 10/01/2005  . Tobacco comment: Quit smoking 2007 Smoked x 25 years 1/2 ppd.  Substance Use Topics  . Alcohol use: No    Alcohol/week: 0.0 standard drinks  . Drug use: No    Objective:   Vitals:   09/12/18 1146  BP: (!) 143/68  Pulse: 89  Temp: 97.6 F (36.4 C)  TempSrc: Oral  Weight: 216 lb (98 kg)  Height: 6' (1.829 m)   Body mass index is 29.29 kg/m.  Physical Exam Vitals signs reviewed.  Constitutional:      General: He is not in acute distress.    Appearance: He is ill-appearing. He is not toxic-appearing.  HENT:     Mouth/Throat:     Mouth: Mucous membranes are moist.     Pharynx: Oropharynx is clear.  Eyes:     General: No scleral icterus. Cardiovascular:     Rate and Rhythm: Normal rate and regular rhythm.     Heart sounds: No murmur.     Comments: RUE AVF +bruit/thrill w/o erythema, pain or swelling.  Pulmonary:     Effort: Pulmonary effort is normal. No respiratory distress.     Breath sounds: Normal breath sounds.  Abdominal:     General: There is distension.     Tenderness: There is no abdominal tenderness.  Musculoskeletal:     Left forearm: He exhibits tenderness, swelling, edema and deformity.       Arms:  Skin:    General: Skin is warm and dry.     Capillary Refill: Capillary refill takes less than 2 seconds.  Coloration: Skin is  not jaundiced.  Neurological:     Mental Status: He is alert.  Psychiatric:     Comments: Quiet, almost depressed mood      Lab Results: Lab Results  Component Value Date   WBC 5.2 09/12/2018   HGB 12.3 (L) 09/12/2018   HCT 37.1 (L) 09/12/2018   MCV 91.4 09/12/2018   PLT 282 09/12/2018    Lab Results  Component Value Date   CREATININE 9.53 (H) 08/30/2018   BUN 46 (H) 08/30/2018   NA 139 08/30/2018   K 4.3 08/30/2018   CL 102 08/30/2018   CO2 23 08/30/2018    Lab Results  Component Value Date   ALT 46 (H) 08/20/2018   AST 39 08/20/2018   ALKPHOS 103 08/20/2018   BILITOT 0.8 08/20/2018     Assessment & Plan:   Problem List Items Addressed This Visit      Unprioritized   Malaise and fatigue    Multiple medical co-morbidities and 2 prolonged hospitalizations. I explained to he and his wife that this is very likely multifactorial and it will take more time to rebuild his strength/appetitie. I encouraged him to continue with physical therapy, other medical team, get adequate rest and eat a balanced diet. His wife suspects that he may have some depression contributing. Will continue to treat his CDI and assess for any other infectious contributor.       Left wrist pain   Relevant Orders   Sedimentation rate   C-reactive protein (Completed)   CT ASPIRATION   US Guided Needle Placement   Gram-negative bacteremia    Recurrent episode of klebsiella pneumo bacteremia that has not been completely explained. He has history of GI bleeding d/t AVM's and I wonder if he could have acquired in this setting. His graft looks good, no worsening back/neck pain (has hardware here) and we ruled out endocarditis. I am worried about the worsened pain of the left wrist; XRays reviewed from hospitalization reveal severe gouty changes however this would increased his risk for infecting the joint in the setting of bacteremia. Will call his ortho team to discuss and request arthrodesis to help  guide treatment. I doubt it was primary source of bacteremia considering his pain has only recently been worse.  Will repeat blood cultures today for reassurance his infection has cleared.       Relevant Orders   Culture, blood (single) (Completed)   Culture, blood (single)   CBC (Completed)   CT ASPIRATION   C. difficile colitis - Primary    Hard to teas out from him whether this is worse. He reports ongoing watery diarrhea that is more than a coffee cup full but then also mentions some smears, up to 3 times daily . No cramping, no fevers but distention. He may have some post-infectious irritable bowel symptoms, however considering this is his second bout of CDI will give him a taper of vancomycin and have him follow up with Dr. Baxter Flattery; may need to consider FMT, however he was not very excited about this option but his wife was interested in discussing further if it would make him feel better. I will give him a stool sample kit to take home if he has worsened diarrhea if we need to repeat a sample.       Relevant Orders   C Difficile Quick Screen w PCR reflex      Janene Madeira, MSN, NP-C [place] [facility]  Pager: [phone] Office: [phone]  09/12/18 2:20 PM   ADDENDUM 09/14/18:  Spoke with Dr. Caralyn Guile who has seen the patient in the past about concern for potential septic left wrist; he has an appointment Friday AM with 87 assistant; will go ahead with arranging CT guided wrist aspiration to help guide further therapy. This cannot be done until 09/23/18 based on IR/Patient schedule.

## 2018-09-12 NOTE — Patient Instructions (Addendum)
For your Vancomycin:  1. Starting December 16th - ONE capsule TWICE a day for 7 days  2. arting December 23rd - ONE capsule ONCE a day for 7 days  3. Starting December 30th - ONE capsule EVERY OTHER day for 8 days (4 doses).   4. Starting January 7th - ONE capsule EVERY 3 DAYS for 2 weeks (5 doses). Your last pill will be January 22nd.    If your results come back positive for bacteria will call you as soon as I hear. No news however is good news; the cultures take 7 days to finalize.   Please go to your orthopedic appointment next Friday - it is hard to know if it is gout or infection and would recommend pulling fluid from the joint to look.   Please return in 3 -4 weeks to see Dr. Baxter Flattery to check in with your diarrhea and how you are feeling.

## 2018-09-12 NOTE — Assessment & Plan Note (Addendum)
Hard to teas out from him whether this is worse. He reports ongoing watery diarrhea that is more than a coffee cup full but then also mentions some smears, up to 3 times daily . No cramping, no fevers but distention. He may have some post-infectious irritable bowel symptoms, however considering this is his second bout of CDI will give him a taper of vancomycin and have him follow up with Dr. Baxter Flattery; may need to consider FMT, however he was not very excited about this option but his wife was interested in discussing further if it would make him feel better. I will give him a stool sample kit to take home if he has worsened diarrhea if we need to repeat a sample.

## 2018-09-13 DIAGNOSIS — E119 Type 2 diabetes mellitus without complications: Secondary | ICD-10-CM | POA: Diagnosis not present

## 2018-09-13 DIAGNOSIS — D509 Iron deficiency anemia, unspecified: Secondary | ICD-10-CM | POA: Diagnosis not present

## 2018-09-13 DIAGNOSIS — R7881 Bacteremia: Secondary | ICD-10-CM | POA: Diagnosis not present

## 2018-09-13 DIAGNOSIS — N186 End stage renal disease: Secondary | ICD-10-CM | POA: Diagnosis not present

## 2018-09-13 DIAGNOSIS — D631 Anemia in chronic kidney disease: Secondary | ICD-10-CM | POA: Diagnosis not present

## 2018-09-13 DIAGNOSIS — N2581 Secondary hyperparathyroidism of renal origin: Secondary | ICD-10-CM | POA: Diagnosis not present

## 2018-09-15 ENCOUNTER — Other Ambulatory Visit: Payer: Self-pay | Admitting: *Deleted

## 2018-09-15 ENCOUNTER — Other Ambulatory Visit: Payer: Self-pay | Admitting: Behavioral Health

## 2018-09-15 DIAGNOSIS — A0472 Enterocolitis due to Clostridium difficile, not specified as recurrent: Secondary | ICD-10-CM

## 2018-09-15 DIAGNOSIS — I5042 Chronic combined systolic (congestive) and diastolic (congestive) heart failure: Secondary | ICD-10-CM | POA: Diagnosis not present

## 2018-09-15 DIAGNOSIS — E1122 Type 2 diabetes mellitus with diabetic chronic kidney disease: Secondary | ICD-10-CM | POA: Diagnosis not present

## 2018-09-15 DIAGNOSIS — I132 Hypertensive heart and chronic kidney disease with heart failure and with stage 5 chronic kidney disease, or end stage renal disease: Secondary | ICD-10-CM | POA: Diagnosis not present

## 2018-09-15 DIAGNOSIS — I5082 Biventricular heart failure: Secondary | ICD-10-CM | POA: Diagnosis not present

## 2018-09-15 DIAGNOSIS — N186 End stage renal disease: Secondary | ICD-10-CM | POA: Diagnosis not present

## 2018-09-15 DIAGNOSIS — D631 Anemia in chronic kidney disease: Secondary | ICD-10-CM | POA: Diagnosis not present

## 2018-09-15 MED ORDER — VANCOMYCIN HCL 125 MG PO CAPS: ORAL_CAPSULE | ORAL | 0 refills | Status: DC

## 2018-09-15 NOTE — Patient Outreach (Signed)
[facility]) Care Management  09/15/2018  EVANS LEVEE [birth year] [phone]   Call received from member's wife as LCSW encouraged her to return call to this care manager.  She report she and member have been very busy with MD appointments and dialysis.  Member has dialysis tomorrow and Thursday, home visit from home health on Wednesday afternoon, and two MD appointments on Friday.  She agrees to home visit/assessment on Wednesday prior to home health coming out but state she will have to leave during assessment to take her son to the doctor.    State she has been in contact with LCSW regarding caregiver burnout, report she still feel she is managing the best she can and will contact LCSW should need change.  Advised to contact this care manager with questions/concerns.  [name], [place], MSN [place] [phone]

## 2018-09-16 DIAGNOSIS — D631 Anemia in chronic kidney disease: Secondary | ICD-10-CM | POA: Diagnosis not present

## 2018-09-16 DIAGNOSIS — N186 End stage renal disease: Secondary | ICD-10-CM | POA: Diagnosis not present

## 2018-09-16 DIAGNOSIS — E119 Type 2 diabetes mellitus without complications: Secondary | ICD-10-CM | POA: Diagnosis not present

## 2018-09-16 DIAGNOSIS — R7881 Bacteremia: Secondary | ICD-10-CM | POA: Diagnosis not present

## 2018-09-16 DIAGNOSIS — N2581 Secondary hyperparathyroidism of renal origin: Secondary | ICD-10-CM | POA: Diagnosis not present

## 2018-09-16 DIAGNOSIS — D509 Iron deficiency anemia, unspecified: Secondary | ICD-10-CM | POA: Diagnosis not present

## 2018-09-16 NOTE — Progress Notes (Signed)
Discussed with Dr. Apolonio Schneiders about concern for increased left wrist pain in the setting of 2 recent bacteremias; he has a history of severe gout. Will proceed with CT-guided aspiration of left wrist for cell count, GS/Culture and crystal analysis to help determine next steps for treatment. Blood cultures are no growth at this point. [name] is still feeling OK - no worse.   Requested to call her on her cell phone to arrange procedure at [phone] as she will be with [name] at dialysis session today (T-Th-Sat). I asked her to please go to the orthopedic appt Friday even if this procedure has not happened. All questions answered.

## 2018-09-17 ENCOUNTER — Other Ambulatory Visit: Payer: Self-pay

## 2018-09-17 DIAGNOSIS — I5082 Biventricular heart failure: Secondary | ICD-10-CM | POA: Diagnosis not present

## 2018-09-17 DIAGNOSIS — E1122 Type 2 diabetes mellitus with diabetic chronic kidney disease: Secondary | ICD-10-CM | POA: Diagnosis not present

## 2018-09-17 DIAGNOSIS — D631 Anemia in chronic kidney disease: Secondary | ICD-10-CM | POA: Diagnosis not present

## 2018-09-17 DIAGNOSIS — N186 End stage renal disease: Secondary | ICD-10-CM | POA: Diagnosis not present

## 2018-09-17 DIAGNOSIS — I5042 Chronic combined systolic (congestive) and diastolic (congestive) heart failure: Secondary | ICD-10-CM | POA: Diagnosis not present

## 2018-09-17 DIAGNOSIS — I132 Hypertensive heart and chronic kidney disease with heart failure and with stage 5 chronic kidney disease, or end stage renal disease: Secondary | ICD-10-CM | POA: Diagnosis not present

## 2018-09-17 NOTE — Assessment & Plan Note (Signed)
Recurrent episode of klebsiella pneumo bacteremia that has not been completely explained. He has history of GI bleeding d/t AVM's and I wonder if he could have acquired in this setting. His graft looks good, no worsening back/neck pain (has hardware here) and we ruled out endocarditis. I am worried about the worsened pain of the left wrist; XRays reviewed from hospitalization reveal severe gouty changes however this would increased his risk for infecting the joint in the setting of bacteremia. Will call his ortho team to discuss and request arthrodesis to help guide treatment. I doubt it was primary source of bacteremia considering his pain has only recently been worse.  Will repeat blood cultures today for reassurance his infection has cleared.

## 2018-09-17 NOTE — Patient Outreach (Signed)
[facility] ([facility]) Care Management   09/17/2018  [name] 08/07/1951 [id number]  [name] is an 67 y.o. male  Subjective: Alert and oriented to person, place, time, situation. Denies mobile meals. C/o left wrist pain. Verified appointments with member: hospital follow up on 09-12-18 complete and next office visit 10-08-2018.   Objective:   ROS  Physical Exam Lung sounds clear throughout; Bowel sounds postive. Pulse regular regular rhythm. Bilateral pedal pulses +. Skin warm to touch.   BP 138/78   Pulse 83   Resp 20   Ht 1.854 m (6' 1")   Wt 216 lb (98 kg)   SpO2 98%   BMI 28.50 kg/m   Encounter Medications:   Outpatient Encounter Medications as of 09/17/2018  Medication Sig  . acetaminophen (TYLENOL) 325 MG tablet Take 650 mg by mouth every 6 (six) hours as needed for mild pain or headache.   . Alirocumab (PRALUENT) 150 MG/ML SOPN Inject 150 mg into the skin every 14 (fourteen) days.  . allopurinol (ZYLOPRIM) 100 MG tablet TAKE ONE TABLET BY MOUTH ONCE DAILY (Patient taking differently: Take 100 mg by mouth daily. )  . aspirin EC 81 MG tablet Take 81 mg by mouth daily.  . betamethasone dipropionate (DIPROLENE) 0.05 % cream Apply 1 application topically daily as needed (leg sores).  . cefTAZidime (FORTAZ) IVPB Inject 2 g into the vein Every Tuesday,Thursday,and Saturday with dialysis. Indication: bacteremia Last Day of Therapy: 09/04/2018 Labs - Once weekly:  CBC/D and BMP, Labs - Every other week:  ESR and CRP  . citalopram (CELEXA) 10 MG tablet Take 1 tablet (10 mg total) by mouth at bedtime.  . colchicine 0.6 MG tablet Take 1 tablet (0.6 mg total) by mouth daily.  . doxercalciferol ([name]) 4 MCG/2ML injection Inject 1 mL (2 mcg total) into the vein Every Tuesday,Thursday,and Saturday with dialysis.  . ezetimibe (ZETIA) 10 MG tablet TAKE ONE TABLET BY MOUTH ONCE DAILY  . ferric citrate (AURYXIA) 1 GM 210 MG(Fe) tablet Take 2 tablets (420 mg total) by  mouth 3 (three) times daily with meals.  . gabapentin (NEURONTIN) 100 MG capsule Take 1 capsule (100 mg total) by mouth at bedtime.  . hydroxypropyl methylcellulose / hypromellose (ISOPTO TEARS / GONIOVISC) 2.5 % ophthalmic solution Place 1 drop into both eyes 3 (three) times daily as needed for dry eyes.  . methimazole (TAPAZOLE) 5 MG tablet Take 0.5 tablets (2.5 mg total) by mouth daily.  . multivitamin (RENA-VIT) TABS tablet Take 1 tablet by mouth daily. Reported on 10/24/2015  . Nutritional Supplements (FEEDING SUPPLEMENT, NEPRO CARB STEADY,) LIQD Take 237 mLs by mouth 2 (two) times daily between meals.  . pantoprazole (PROTONIX) 40 MG tablet Take 40 mg by mouth daily.  . polyethylene glycol (MIRALAX / GLYCOLAX) packet Take 17 g by mouth daily.  . SENSIPAR 90 MG tablet Take 90 mg by mouth 2 (two) times daily.  . vancomycin (VANCOCIN) 125 MG capsule Take 1 capsule (125 mg total) by mouth 2 (two) times daily for 7 days, THEN 1 capsule (125 mg total) daily for 7 days, THEN 1 capsule (125 mg total) every other day for 8 days, THEN 1 capsule (125 mg total) every 3 (three) days for 14 days.  . WELCHOL 625 MG tablet TAKE THREE TABLETS BY MOUTH TWICE DAILY WITH MEALS (Patient taking differently: Take 1,875 mg by mouth 2 (two) times daily with a meal. )  . zolpidem (AMBIEN) 5 MG tablet Take 1 tablet (5   mg total) by mouth at bedtime as needed. for sleep (Patient taking differently: Take 5 mg by mouth at bedtime as needed for sleep. )   No facility-administered encounter medications on file as of 09/17/2018.     Functional Status:   In your present state of health, do you have any difficulty performing the following activities: 09/17/2018 08/21/2018  Hearing? N N  Vision? N N  Difficulty concentrating or making decisions? N Y  Comment - -  Walking or climbing stairs? Y Y  Dressing or bathing? Y Y  Doing errands, shopping? Y N  Comment - -  Preparing Food and eating ? Y -  Using the Toilet? Y -   In the past six months, have you accidently leaked urine? N -  Do you have problems with loss of bowel control? N -  Managing your Medications? N -  Managing your Finances? Y -  Housekeeping or managing your Housekeeping? Y -  Some recent data might be hidden    Fall/Depression Screening:    Fall Risk  09/17/2018 07/10/2017 04/17/2017  Falls in the past year? 1 Yes Yes  Comment - - No new falls since 02/07/2017 per patient  Number falls in past yr: 1 2 or more 2 or more  Injury with Fall? 0 Yes Yes  Risk Factor Category  - High Fall Risk High Fall Risk  Risk for fall due to : History of fall(s) History of fall(s);Impaired balance/gait;Impaired mobility History of fall(s);Impaired balance/gait;Impaired mobility  Follow up Falls prevention discussed;Education provided;Falls evaluation completed Falls evaluation completed;Falls prevention discussed;Follow up appointment Falls prevention discussed;Falls evaluation completed;Education provided   PHQ 2/9 Scores 09/17/2018 02/07/2017 02/01/2017 10/15/2016 12/29/2015 10/24/2015 10/21/2015  PHQ - 2 Score 0 6 1 2 0 0 0  PHQ- 9 Score - 11 - 6 - - -   Assessment:   Lives with spouse. States ability to perform own ADLs and wife assists with IADLs. C/o left wrist pain level 9 on scale 0-10. Takes tylenol; however, pain continues; wears left wrist splint and states relief when limb repositioned and elevated. Member states he will follow up with his PCP for pain medication. Discussed hospital discharge instructions and education provided. Provided [facility] Calendar and educated on logging vital signs, weights, etc. [facility] magnet with 24 hour nurse call line number given to member to call when needed. Member advised to contact this CM with any questions/concerns and contact information provided to member.   Plan:  1. Will send Pharmacy Referral due to member states he is unable to afford cost of Wellchol medication and he has requested samples from PCP; however, none  available.  2. Will send SW Referral due to member requesting resources for affordable wood flooring due to carpet and rugs contributing to multiple falls at home. 3. Will follow up with member next week to assess needs/concerns.   [facility] CM Care Plan Problem One     Most Recent Value  Care Plan Problem One  Patient at risk for hospitalization related to sepsis as evidenced by recent hospitalization  Role Documenting the Problem One  Care Management Coordinator  Care Plan for Problem One  Active  [facility] Long Term Goal   Patient will not have a hospital admission within the next 31 days  [facility] Long Term Goal Start Date  08/31/18  [facility] Long Term Goal Met Date  -- [initial goal not met, member readmitted]  Interventions for Problem One Long Term Goal  reviewed discharge instructions in the home,    Educated on s/s of infection,  Educated on importance of adherence to renal diet.   [facility] CM Short Term Goal #1   Member will have follow up appointment with primary MD within the next 2 weeks  [facility] CM Short Term Goal #1 Start Date  07/25/18  [facility] CM Short Term Goal #1 Met Date  09/17/18  [facility] CM Short Term Goal #2   Patient will take all medications as prescribed for the next 4 weeks  [facility] CM Short Term Goal #2 Start Date  07/25/18  [facility] CM Short Term Goal #2 Met Date  09/17/18  [facility] CM Short Term Goal #3  Member will free from infection over the next 30 days  [facility] CM Short Term Goal #3 Start Date  09/17/18  Interventions for Short Tern Goal #3  Discussed hospital discharge instructions concerning infection risk,  Educated member on s/s of infection and assessed member's knowledge of when to call PCP/911,  Assessed whether or not wounds present and member denies current wounds.  [facility] CM Short Term Goal #4  Member will not report falls at home for next 30 days  [facility] CM Short Term Goal #4 Start Date  09/17/18  Interventions for Short Term Goal #4  Assessed member's home for fall risks such as scattered rugs/cords,  Educated  member on risk for falls such as cords/ scattered rugs,       Marguerette Sheller "ANN" [employer], [place] Management  Community Care Management Coordinator  [phone] [email or web address]

## 2018-09-17 NOTE — Assessment & Plan Note (Addendum)
Multiple medical co-morbidities and 2 prolonged hospitalizations. I explained to he and his wife that this is very likely multifactorial and it will take more time to rebuild his strength/appetitie. I encouraged him to continue with physical therapy, other medical team, get adequate rest and eat a balanced diet. His wife suspects that he may have some depression contributing. Will continue to treat his CDI and assess for any other infectious contributor.

## 2018-09-18 ENCOUNTER — Telehealth: Payer: Self-pay | Admitting: Pharmacist

## 2018-09-18 DIAGNOSIS — D509 Iron deficiency anemia, unspecified: Secondary | ICD-10-CM | POA: Diagnosis not present

## 2018-09-18 DIAGNOSIS — R7881 Bacteremia: Secondary | ICD-10-CM | POA: Diagnosis not present

## 2018-09-18 DIAGNOSIS — E119 Type 2 diabetes mellitus without complications: Secondary | ICD-10-CM | POA: Diagnosis not present

## 2018-09-18 DIAGNOSIS — N186 End stage renal disease: Secondary | ICD-10-CM | POA: Diagnosis not present

## 2018-09-18 DIAGNOSIS — N2581 Secondary hyperparathyroidism of renal origin: Secondary | ICD-10-CM | POA: Diagnosis not present

## 2018-09-18 DIAGNOSIS — D631 Anemia in chronic kidney disease: Secondary | ICD-10-CM | POA: Diagnosis not present

## 2018-09-18 NOTE — Telephone Encounter (Signed)
This encounter was created in error - please disregard.

## 2018-09-18 NOTE — Patient Outreach (Signed)
[place] [facility]) Care Management  09/18/2018  [name] 1951-06-09 [id number]   Patient was called regarding medication assistance. HIPAA identifiers were obtained. Patient asked that I speak with his wife. He is a 67 year old male with multiple medical conditions including but not limited to:  Arthritis of the knee, BPH, CAD, cervical Radiculopathy, CHF, depression, type 2 diabetes, hypertension, generalized weakness, recurrent falls, and vertigo.  Patient asked that I speak with his wife [name]) about his medications. Patient was referred for medication assistance with Welchol.  [facility] Pharmacist, Ralene Bathe already assessed the patient for medication assistance for [facility] and stated he was over income. Patient's wife said her husband must have forgotten she was aware her husband was not eligible.    Medicare Part D plan information was reviewed with the patient's wife.  Patient is receiving Zetia through patient assistance currently and [name] is working on his 1225 application.  Plan: Remove myself from the care team as the patient does not have any imminent needs and is being followed by other [facility] staff members.   [name], PharmD, [place] Clinical Pharmacist [phone]

## 2018-09-19 DIAGNOSIS — D649 Anemia, unspecified: Secondary | ICD-10-CM | POA: Diagnosis not present

## 2018-09-19 DIAGNOSIS — A414 Sepsis due to anaerobes: Secondary | ICD-10-CM | POA: Diagnosis not present

## 2018-09-20 DIAGNOSIS — D631 Anemia in chronic kidney disease: Secondary | ICD-10-CM | POA: Diagnosis not present

## 2018-09-20 DIAGNOSIS — N186 End stage renal disease: Secondary | ICD-10-CM | POA: Diagnosis not present

## 2018-09-20 DIAGNOSIS — N2581 Secondary hyperparathyroidism of renal origin: Secondary | ICD-10-CM | POA: Diagnosis not present

## 2018-09-20 DIAGNOSIS — R7881 Bacteremia: Secondary | ICD-10-CM | POA: Diagnosis not present

## 2018-09-20 DIAGNOSIS — D509 Iron deficiency anemia, unspecified: Secondary | ICD-10-CM | POA: Diagnosis not present

## 2018-09-20 DIAGNOSIS — E119 Type 2 diabetes mellitus without complications: Secondary | ICD-10-CM | POA: Diagnosis not present

## 2018-09-20 LAB — CULTURE, BLOOD (SINGLE): MICRO NUMBER:: 91497914

## 2018-09-20 LAB — CBC
HCT: 37.1 % — ABNORMAL LOW (ref 38.5–50.0)
Hemoglobin: 12.3 g/dL — ABNORMAL LOW (ref 13.2–17.1)
MCH: 30.3 pg (ref 27.0–33.0)
MCHC: 33.2 g/dL (ref 32.0–36.0)
MCV: 91.4 fL (ref 80.0–100.0)
MPV: 11.1 fL (ref 7.5–12.5)
Platelets: 282 10*3/uL (ref 140–400)
RBC: 4.06 10*6/uL — AB (ref 4.20–5.80)
RDW: 16.8 % — ABNORMAL HIGH (ref 11.0–15.0)
WBC: 5.2 10*3/uL (ref 3.8–10.8)

## 2018-09-20 LAB — C-REACTIVE PROTEIN: CRP: 54.4 mg/L — ABNORMAL HIGH (ref <8.0)

## 2018-09-22 DIAGNOSIS — N2581 Secondary hyperparathyroidism of renal origin: Secondary | ICD-10-CM | POA: Diagnosis not present

## 2018-09-22 DIAGNOSIS — D509 Iron deficiency anemia, unspecified: Secondary | ICD-10-CM | POA: Diagnosis not present

## 2018-09-22 DIAGNOSIS — E119 Type 2 diabetes mellitus without complications: Secondary | ICD-10-CM | POA: Diagnosis not present

## 2018-09-22 DIAGNOSIS — R7881 Bacteremia: Secondary | ICD-10-CM | POA: Diagnosis not present

## 2018-09-22 DIAGNOSIS — N186 End stage renal disease: Secondary | ICD-10-CM | POA: Diagnosis not present

## 2018-09-22 DIAGNOSIS — D631 Anemia in chronic kidney disease: Secondary | ICD-10-CM | POA: Diagnosis not present

## 2018-09-23 ENCOUNTER — Ambulatory Visit (HOSPITAL_COMMUNITY)
Admission: RE | Admit: 2018-09-23 | Discharge: 2018-09-23 | Disposition: A | Discharge status: Home / Self Care | Payer: Medicare Other | Source: Ambulatory Visit | Attending: Infectious Diseases | Admitting: Infectious Diseases

## 2018-09-23 DIAGNOSIS — E1122 Type 2 diabetes mellitus with diabetic chronic kidney disease: Secondary | ICD-10-CM | POA: Diagnosis not present

## 2018-09-23 DIAGNOSIS — M25532 Pain in left wrist: Secondary | ICD-10-CM | POA: Insufficient documentation

## 2018-09-23 DIAGNOSIS — D631 Anemia in chronic kidney disease: Secondary | ICD-10-CM | POA: Diagnosis not present

## 2018-09-23 DIAGNOSIS — R7881 Bacteremia: Secondary | ICD-10-CM | POA: Diagnosis not present

## 2018-09-23 DIAGNOSIS — I132 Hypertensive heart and chronic kidney disease with heart failure and with stage 5 chronic kidney disease, or end stage renal disease: Secondary | ICD-10-CM | POA: Diagnosis not present

## 2018-09-23 DIAGNOSIS — I5082 Biventricular heart failure: Secondary | ICD-10-CM | POA: Diagnosis not present

## 2018-09-23 DIAGNOSIS — N186 End stage renal disease: Secondary | ICD-10-CM | POA: Diagnosis not present

## 2018-09-23 DIAGNOSIS — I5042 Chronic combined systolic (congestive) and diastolic (congestive) heart failure: Secondary | ICD-10-CM | POA: Diagnosis not present

## 2018-09-23 MED ORDER — LIDOCAINE HCL (PF) 1 % IJ SOLN
INTRAMUSCULAR | Status: AC
  Filled 2018-09-23: qty 30

## 2018-09-23 NOTE — Procedures (Signed)
wirst pain, possible septic arthritis  S/p [place] left wrist aspiration No comp Trace synovial fld aspirated No pus cx sent

## 2018-09-25 ENCOUNTER — Other Ambulatory Visit: Payer: Self-pay

## 2018-09-25 ENCOUNTER — Telehealth: Payer: Self-pay | Admitting: Cardiovascular Disease

## 2018-09-25 DIAGNOSIS — D631 Anemia in chronic kidney disease: Secondary | ICD-10-CM | POA: Diagnosis not present

## 2018-09-25 DIAGNOSIS — N186 End stage renal disease: Secondary | ICD-10-CM | POA: Diagnosis not present

## 2018-09-25 DIAGNOSIS — D509 Iron deficiency anemia, unspecified: Secondary | ICD-10-CM | POA: Diagnosis not present

## 2018-09-25 DIAGNOSIS — E119 Type 2 diabetes mellitus without complications: Secondary | ICD-10-CM | POA: Diagnosis not present

## 2018-09-25 DIAGNOSIS — R7881 Bacteremia: Secondary | ICD-10-CM | POA: Diagnosis not present

## 2018-09-25 DIAGNOSIS — N2581 Secondary hyperparathyroidism of renal origin: Secondary | ICD-10-CM | POA: Diagnosis not present

## 2018-09-25 NOTE — Patient Outreach (Signed)
[facility]) Care Management  09/25/2018  [name] April 17, 1951 [id number]  BSW attempted to contact the patient on today's date to conduct a community resource consult. Unfortunately, today's call was unsuccessful. BSW left the patient a HIPAA compliant voice message requesting a return call.   Plan: BSW will mail the patient an unsuccessful outreach letter. BSW will attempt the patient again within the next four business days.  Daneen Schick, BSW, CDP [facility] [phone]

## 2018-09-25 NOTE — Telephone Encounter (Signed)
Returned call to wife (ok per DPR)-she states she filled out the [employer] forms and returned them to pharmD in late November (to re-enroll patient) for Praluent.   She states they just received a letter stating that he needed to enroll.    She is calling to follow up on this.    Advised would send message to pharmD to review.   Wife aware and verbalized understanding.

## 2018-09-25 NOTE — Telephone Encounter (Signed)
New Message         Patient's wife is calling today because she received a letter stating that her husband needs to enroll in the medication program. Patient's wife states she has already turned those papers in.  She would like a call back concerning this matter.

## 2018-09-25 NOTE — Telephone Encounter (Signed)
I believe we have this paperwork.  PASS is not renewing any applications for free medication until after Jan 1.  So we have probably sent it in.  I'm guessing that was a generic reminder to all patients.  I will double check the paperwork on Monday when I'm back in the office.

## 2018-09-26 ENCOUNTER — Telehealth: Payer: Self-pay | Admitting: Infectious Diseases

## 2018-09-26 DIAGNOSIS — E1122 Type 2 diabetes mellitus with diabetic chronic kidney disease: Secondary | ICD-10-CM | POA: Diagnosis not present

## 2018-09-26 DIAGNOSIS — I132 Hypertensive heart and chronic kidney disease with heart failure and with stage 5 chronic kidney disease, or end stage renal disease: Secondary | ICD-10-CM | POA: Diagnosis not present

## 2018-09-26 DIAGNOSIS — D631 Anemia in chronic kidney disease: Secondary | ICD-10-CM | POA: Diagnosis not present

## 2018-09-26 DIAGNOSIS — N186 End stage renal disease: Secondary | ICD-10-CM | POA: Diagnosis not present

## 2018-09-26 DIAGNOSIS — I5082 Biventricular heart failure: Secondary | ICD-10-CM | POA: Diagnosis not present

## 2018-09-26 DIAGNOSIS — M11832 Other specified crystal arthropathies, left wrist: Secondary | ICD-10-CM | POA: Diagnosis not present

## 2018-09-26 DIAGNOSIS — I5042 Chronic combined systolic (congestive) and diastolic (congestive) heart failure: Secondary | ICD-10-CM | POA: Diagnosis not present

## 2018-09-26 NOTE — Telephone Encounter (Signed)
Called Mr and Mrs Kaigler to check in - his diarrhea is nearly resolved since tapering his vancomycin. Encouraged to keep follow up with Dr. Baxter Flattery as currently scheduled.   No growth from left wrist aspirate. Suspect gout and not septic arthritis. He is currently at orthopedic office to follow up with Dr. Apolonio Schneiders.   She thanked me for my call.

## 2018-09-27 DIAGNOSIS — N186 End stage renal disease: Secondary | ICD-10-CM | POA: Diagnosis not present

## 2018-09-27 DIAGNOSIS — E119 Type 2 diabetes mellitus without complications: Secondary | ICD-10-CM | POA: Diagnosis not present

## 2018-09-27 DIAGNOSIS — N2581 Secondary hyperparathyroidism of renal origin: Secondary | ICD-10-CM | POA: Diagnosis not present

## 2018-09-27 DIAGNOSIS — R7881 Bacteremia: Secondary | ICD-10-CM | POA: Diagnosis not present

## 2018-09-27 DIAGNOSIS — D631 Anemia in chronic kidney disease: Secondary | ICD-10-CM | POA: Diagnosis not present

## 2018-09-27 DIAGNOSIS — D509 Iron deficiency anemia, unspecified: Secondary | ICD-10-CM | POA: Diagnosis not present

## 2018-09-28 LAB — AEROBIC/ANAEROBIC CULTURE W GRAM STAIN (SURGICAL/DEEP WOUND): Culture: NO GROWTH

## 2018-09-28 LAB — AEROBIC/ANAEROBIC CULTURE (SURGICAL/DEEP WOUND)

## 2018-09-29 ENCOUNTER — Other Ambulatory Visit: Payer: Self-pay | Admitting: *Deleted

## 2018-09-29 DIAGNOSIS — D631 Anemia in chronic kidney disease: Secondary | ICD-10-CM | POA: Diagnosis not present

## 2018-09-29 DIAGNOSIS — N186 End stage renal disease: Secondary | ICD-10-CM | POA: Diagnosis not present

## 2018-09-29 DIAGNOSIS — R7881 Bacteremia: Secondary | ICD-10-CM | POA: Diagnosis not present

## 2018-09-29 DIAGNOSIS — E119 Type 2 diabetes mellitus without complications: Secondary | ICD-10-CM | POA: Diagnosis not present

## 2018-09-29 DIAGNOSIS — N2581 Secondary hyperparathyroidism of renal origin: Secondary | ICD-10-CM | POA: Diagnosis not present

## 2018-09-29 DIAGNOSIS — D509 Iron deficiency anemia, unspecified: Secondary | ICD-10-CM | POA: Diagnosis not present

## 2018-09-29 NOTE — Telephone Encounter (Signed)
Wife made aware and verbalized understanding. 

## 2018-09-29 NOTE — Patient Outreach (Signed)
[place] [facility]) Care Management  09/29/2018  TIMOTHEY DAHLSTROM April 26, 1951 [id number]   Call placed to member/caregiver, spoke to wife [name].  She state this is not a good time to talk as they have just experienced a death in the family.  Condolences provided, request made for member/wife to call this care manager back at their earliest convenience.  Will follow up within the next 2 weeks if no call back.  [name], [place], MSN [place] [phone]

## 2018-09-30 ENCOUNTER — Other Ambulatory Visit: Payer: Self-pay

## 2018-09-30 DIAGNOSIS — I5042 Chronic combined systolic (congestive) and diastolic (congestive) heart failure: Secondary | ICD-10-CM | POA: Diagnosis not present

## 2018-09-30 DIAGNOSIS — N186 End stage renal disease: Secondary | ICD-10-CM | POA: Diagnosis not present

## 2018-09-30 DIAGNOSIS — E1122 Type 2 diabetes mellitus with diabetic chronic kidney disease: Secondary | ICD-10-CM | POA: Diagnosis not present

## 2018-09-30 DIAGNOSIS — I5082 Biventricular heart failure: Secondary | ICD-10-CM | POA: Diagnosis not present

## 2018-09-30 DIAGNOSIS — I132 Hypertensive heart and chronic kidney disease with heart failure and with stage 5 chronic kidney disease, or end stage renal disease: Secondary | ICD-10-CM | POA: Diagnosis not present

## 2018-09-30 DIAGNOSIS — D631 Anemia in chronic kidney disease: Secondary | ICD-10-CM | POA: Diagnosis not present

## 2018-09-30 NOTE — Patient Outreach (Signed)
[place] [facility]) Care Management  09/30/2018  [name] 10-05-1950 [id number]  Successful call placed to member, HIPAA identifiers returned. BSW introduced self to the patient and the reason for today's call. The patient requested BSW "call back on Friday". BSW to reschedule patient appointment.  Daneen Schick, BSW, CDP [facility] [phone]

## 2018-10-01 DIAGNOSIS — N186 End stage renal disease: Secondary | ICD-10-CM | POA: Diagnosis not present

## 2018-10-01 DIAGNOSIS — E1129 Type 2 diabetes mellitus with other diabetic kidney complication: Secondary | ICD-10-CM | POA: Diagnosis not present

## 2018-10-01 DIAGNOSIS — Z992 Dependence on renal dialysis: Secondary | ICD-10-CM | POA: Diagnosis not present

## 2018-10-02 DIAGNOSIS — D509 Iron deficiency anemia, unspecified: Secondary | ICD-10-CM | POA: Diagnosis not present

## 2018-10-02 DIAGNOSIS — N186 End stage renal disease: Secondary | ICD-10-CM | POA: Diagnosis not present

## 2018-10-02 DIAGNOSIS — E119 Type 2 diabetes mellitus without complications: Secondary | ICD-10-CM | POA: Diagnosis not present

## 2018-10-02 DIAGNOSIS — N2581 Secondary hyperparathyroidism of renal origin: Secondary | ICD-10-CM | POA: Diagnosis not present

## 2018-10-02 DIAGNOSIS — D631 Anemia in chronic kidney disease: Secondary | ICD-10-CM | POA: Diagnosis not present

## 2018-10-03 ENCOUNTER — Inpatient Hospital Stay (HOSPITAL_COMMUNITY)
Admission: EM | Admit: 2018-10-03 | Discharge: 2018-10-09 | DRG: 871 | Disposition: A | Discharge status: Home Health | Payer: Medicare Other | Attending: Internal Medicine | Admitting: Internal Medicine

## 2018-10-03 ENCOUNTER — Encounter (HOSPITAL_COMMUNITY): Payer: Self-pay | Admitting: Emergency Medicine

## 2018-10-03 ENCOUNTER — Emergency Department (HOSPITAL_COMMUNITY): Payer: Medicare Other

## 2018-10-03 ENCOUNTER — Other Ambulatory Visit: Payer: Self-pay

## 2018-10-03 DIAGNOSIS — R7989 Other specified abnormal findings of blood chemistry: Secondary | ICD-10-CM | POA: Diagnosis present

## 2018-10-03 DIAGNOSIS — K759 Inflammatory liver disease, unspecified: Secondary | ICD-10-CM

## 2018-10-03 DIAGNOSIS — B179 Acute viral hepatitis, unspecified: Secondary | ICD-10-CM

## 2018-10-03 DIAGNOSIS — I5082 Biventricular heart failure: Secondary | ICD-10-CM | POA: Diagnosis not present

## 2018-10-03 DIAGNOSIS — I10 Essential (primary) hypertension: Secondary | ICD-10-CM | POA: Diagnosis present

## 2018-10-03 DIAGNOSIS — N400 Enlarged prostate without lower urinary tract symptoms: Secondary | ICD-10-CM | POA: Diagnosis present

## 2018-10-03 DIAGNOSIS — E1142 Type 2 diabetes mellitus with diabetic polyneuropathy: Secondary | ICD-10-CM | POA: Diagnosis present

## 2018-10-03 DIAGNOSIS — I132 Hypertensive heart and chronic kidney disease with heart failure and with stage 5 chronic kidney disease, or end stage renal disease: Secondary | ICD-10-CM | POA: Diagnosis present

## 2018-10-03 DIAGNOSIS — R404 Transient alteration of awareness: Secondary | ICD-10-CM | POA: Diagnosis not present

## 2018-10-03 DIAGNOSIS — E669 Obesity, unspecified: Secondary | ICD-10-CM | POA: Diagnosis present

## 2018-10-03 DIAGNOSIS — B961 Klebsiella pneumoniae [K. pneumoniae] as the cause of diseases classified elsewhere: Secondary | ICD-10-CM | POA: Diagnosis present

## 2018-10-03 DIAGNOSIS — F0390 Unspecified dementia without behavioral disturbance: Secondary | ICD-10-CM | POA: Diagnosis present

## 2018-10-03 DIAGNOSIS — I6340 Cerebral infarction due to embolism of unspecified cerebral artery: Secondary | ICD-10-CM

## 2018-10-03 DIAGNOSIS — R0902 Hypoxemia: Secondary | ICD-10-CM | POA: Diagnosis not present

## 2018-10-03 DIAGNOSIS — R7881 Bacteremia: Secondary | ICD-10-CM | POA: Diagnosis not present

## 2018-10-03 DIAGNOSIS — Z96659 Presence of unspecified artificial knee joint: Secondary | ICD-10-CM

## 2018-10-03 DIAGNOSIS — R34 Anuria and oliguria: Secondary | ICD-10-CM | POA: Diagnosis present

## 2018-10-03 DIAGNOSIS — D631 Anemia in chronic kidney disease: Secondary | ICD-10-CM | POA: Diagnosis not present

## 2018-10-03 DIAGNOSIS — N2581 Secondary hyperparathyroidism of renal origin: Secondary | ICD-10-CM | POA: Diagnosis present

## 2018-10-03 DIAGNOSIS — R410 Disorientation, unspecified: Secondary | ICD-10-CM | POA: Diagnosis not present

## 2018-10-03 DIAGNOSIS — Z881 Allergy status to other antibiotic agents status: Secondary | ICD-10-CM

## 2018-10-03 DIAGNOSIS — R29700 NIHSS score 0: Secondary | ICD-10-CM | POA: Diagnosis present

## 2018-10-03 DIAGNOSIS — E1151 Type 2 diabetes mellitus with diabetic peripheral angiopathy without gangrene: Secondary | ICD-10-CM | POA: Diagnosis present

## 2018-10-03 DIAGNOSIS — R911 Solitary pulmonary nodule: Secondary | ICD-10-CM | POA: Diagnosis present

## 2018-10-03 DIAGNOSIS — E0590 Thyrotoxicosis, unspecified without thyrotoxic crisis or storm: Secondary | ICD-10-CM | POA: Diagnosis present

## 2018-10-03 DIAGNOSIS — Z96651 Presence of right artificial knee joint: Secondary | ICD-10-CM | POA: Diagnosis present

## 2018-10-03 DIAGNOSIS — Z8546 Personal history of malignant neoplasm of prostate: Secondary | ICD-10-CM

## 2018-10-03 DIAGNOSIS — G4733 Obstructive sleep apnea (adult) (pediatric): Secondary | ICD-10-CM | POA: Diagnosis present

## 2018-10-03 DIAGNOSIS — M109 Gout, unspecified: Secondary | ICD-10-CM | POA: Diagnosis present

## 2018-10-03 DIAGNOSIS — I491 Atrial premature depolarization: Secondary | ICD-10-CM | POA: Diagnosis not present

## 2018-10-03 DIAGNOSIS — Z87891 Personal history of nicotine dependence: Secondary | ICD-10-CM

## 2018-10-03 DIAGNOSIS — J9601 Acute respiratory failure with hypoxia: Secondary | ICD-10-CM | POA: Diagnosis not present

## 2018-10-03 DIAGNOSIS — Z79899 Other long term (current) drug therapy: Secondary | ICD-10-CM

## 2018-10-03 DIAGNOSIS — I5042 Chronic combined systolic (congestive) and diastolic (congestive) heart failure: Secondary | ICD-10-CM | POA: Diagnosis not present

## 2018-10-03 DIAGNOSIS — Z888 Allergy status to other drugs, medicaments and biological substances status: Secondary | ICD-10-CM

## 2018-10-03 DIAGNOSIS — R740 Nonspecific elevation of levels of transaminase and lactic acid dehydrogenase [LDH]: Secondary | ICD-10-CM | POA: Diagnosis not present

## 2018-10-03 DIAGNOSIS — E1122 Type 2 diabetes mellitus with diabetic chronic kidney disease: Secondary | ICD-10-CM | POA: Diagnosis not present

## 2018-10-03 DIAGNOSIS — Z9861 Coronary angioplasty status: Secondary | ICD-10-CM

## 2018-10-03 DIAGNOSIS — E785 Hyperlipidemia, unspecified: Secondary | ICD-10-CM | POA: Diagnosis present

## 2018-10-03 DIAGNOSIS — Z8249 Family history of ischemic heart disease and other diseases of the circulatory system: Secondary | ICD-10-CM

## 2018-10-03 DIAGNOSIS — Z6827 Body mass index (BMI) 27.0-27.9, adult: Secondary | ICD-10-CM

## 2018-10-03 DIAGNOSIS — Z9049 Acquired absence of other specified parts of digestive tract: Secondary | ICD-10-CM

## 2018-10-03 DIAGNOSIS — K219 Gastro-esophageal reflux disease without esophagitis: Secondary | ICD-10-CM | POA: Diagnosis present

## 2018-10-03 DIAGNOSIS — Z7982 Long term (current) use of aspirin: Secondary | ICD-10-CM

## 2018-10-03 DIAGNOSIS — R4182 Altered mental status, unspecified: Secondary | ICD-10-CM | POA: Diagnosis not present

## 2018-10-03 DIAGNOSIS — I63449 Cerebral infarction due to embolism of unspecified cerebellar artery: Secondary | ICD-10-CM

## 2018-10-03 DIAGNOSIS — T380X5A Adverse effect of glucocorticoids and synthetic analogues, initial encounter: Secondary | ICD-10-CM | POA: Diagnosis not present

## 2018-10-03 DIAGNOSIS — Z1611 Resistance to penicillins: Secondary | ICD-10-CM | POA: Diagnosis present

## 2018-10-03 DIAGNOSIS — R079 Chest pain, unspecified: Secondary | ICD-10-CM | POA: Diagnosis present

## 2018-10-03 DIAGNOSIS — I6381 Other cerebral infarction due to occlusion or stenosis of small artery: Secondary | ICD-10-CM | POA: Diagnosis not present

## 2018-10-03 DIAGNOSIS — R000 Tachycardia, unspecified: Secondary | ICD-10-CM | POA: Diagnosis not present

## 2018-10-03 DIAGNOSIS — E1129 Type 2 diabetes mellitus with other diabetic kidney complication: Secondary | ICD-10-CM

## 2018-10-03 DIAGNOSIS — Z841 Family history of disorders of kidney and ureter: Secondary | ICD-10-CM

## 2018-10-03 DIAGNOSIS — R52 Pain, unspecified: Secondary | ICD-10-CM | POA: Diagnosis not present

## 2018-10-03 DIAGNOSIS — E1165 Type 2 diabetes mellitus with hyperglycemia: Secondary | ICD-10-CM | POA: Diagnosis not present

## 2018-10-03 DIAGNOSIS — I252 Old myocardial infarction: Secondary | ICD-10-CM

## 2018-10-03 DIAGNOSIS — F329 Major depressive disorder, single episode, unspecified: Secondary | ICD-10-CM | POA: Diagnosis present

## 2018-10-03 DIAGNOSIS — G9340 Encephalopathy, unspecified: Secondary | ICD-10-CM | POA: Diagnosis present

## 2018-10-03 DIAGNOSIS — E1121 Type 2 diabetes mellitus with diabetic nephropathy: Secondary | ICD-10-CM

## 2018-10-03 DIAGNOSIS — I739 Peripheral vascular disease, unspecified: Secondary | ICD-10-CM | POA: Diagnosis present

## 2018-10-03 DIAGNOSIS — N186 End stage renal disease: Secondary | ICD-10-CM | POA: Diagnosis not present

## 2018-10-03 DIAGNOSIS — I255 Ischemic cardiomyopathy: Secondary | ICD-10-CM | POA: Diagnosis present

## 2018-10-03 DIAGNOSIS — I2510 Atherosclerotic heart disease of native coronary artery without angina pectoris: Secondary | ICD-10-CM

## 2018-10-03 DIAGNOSIS — R55 Syncope and collapse: Secondary | ICD-10-CM | POA: Diagnosis present

## 2018-10-03 DIAGNOSIS — Z955 Presence of coronary angioplasty implant and graft: Secondary | ICD-10-CM

## 2018-10-03 DIAGNOSIS — A0472 Enterocolitis due to Clostridium difficile, not specified as recurrent: Secondary | ICD-10-CM | POA: Diagnosis present

## 2018-10-03 DIAGNOSIS — M5412 Radiculopathy, cervical region: Secondary | ICD-10-CM | POA: Diagnosis present

## 2018-10-03 DIAGNOSIS — I517 Cardiomegaly: Secondary | ICD-10-CM | POA: Diagnosis not present

## 2018-10-03 DIAGNOSIS — Z833 Family history of diabetes mellitus: Secondary | ICD-10-CM

## 2018-10-03 DIAGNOSIS — Z992 Dependence on renal dialysis: Secondary | ICD-10-CM

## 2018-10-03 DIAGNOSIS — N189 Chronic kidney disease, unspecified: Secondary | ICD-10-CM

## 2018-10-03 DIAGNOSIS — R748 Abnormal levels of other serum enzymes: Secondary | ICD-10-CM | POA: Diagnosis present

## 2018-10-03 DIAGNOSIS — J309 Allergic rhinitis, unspecified: Secondary | ICD-10-CM | POA: Diagnosis present

## 2018-10-03 DIAGNOSIS — Z8719 Personal history of other diseases of the digestive system: Secondary | ICD-10-CM

## 2018-10-03 DIAGNOSIS — E875 Hyperkalemia: Secondary | ICD-10-CM | POA: Diagnosis present

## 2018-10-03 HISTORY — DX: Chest pain, unspecified: R07.9

## 2018-10-03 HISTORY — DX: Encephalopathy, unspecified: G93.40

## 2018-10-03 LAB — I-STAT TROPONIN, ED: Troponin i, poc: 0.03 ng/mL (ref 0.00–0.08)

## 2018-10-03 LAB — I-STAT CG4 LACTIC ACID, ED: LACTIC ACID, VENOUS: 2.71 mmol/L — AB (ref 0.5–1.9)

## 2018-10-03 LAB — CBG MONITORING, ED: Glucose-Capillary: 186 mg/dL — ABNORMAL HIGH (ref 70–99)

## 2018-10-03 NOTE — ED Triage Notes (Signed)
  Patient BIB EMS for AMS.  Patient is dialysis patient T/TH/Sat and was recently admitted for sepsis.  Wife of patient said he was not feeling well and was not able to do his physical therapy today.  Patient was altered on EMS arrival but has become more alert on arrival.  Patient is A&O x4.  Patient states he has no pain but just doesn't feel well.

## 2018-10-03 NOTE — Patient Outreach (Signed)
[place] [facility]) Care Management  10/03/2018  [name] 1950/10/10 [id number]  Successful outreach to the patients home on today's date. BSW spoke with the patients spouse, [name], as the patient was resting. Patient HIPAA identifiers confirmed. BSW introduced self to [place] and explained the reason for today's call, indicating the patient had been referred to this BSW regarding flooring resources. [name] is unable to state what her husband is interested in but does confirm "we need new floors".  BSW explained that there are not many resources to assist with flooring. BSW provided [place] with the contact numbers to [employer] [phone]) as well as the number to [place] [phone]). BSW explained that both of the previously mentioned resources assist with home modifications and may or may not replace flooring. [name] stated understanding.  [name] discussed recent stressors including a death in her family as well as the patients care needs. [name] stated "he just keeps complaining of being sick and tired and weak but I am doing so much and we already have so many doctors". [name] denies any new symptoms stating this is the patients new normal. BSW inquired if [name] is interested in any caregiver resources. At this time, [name] declines caregiver resources stating "he only wants me to help him". BSW encouraged [name] to have a family member stay with the patient to allow her time away from the home. [name] stated understanding.  BSW offered to contact [place] within the next 3-4 weeks to check in regarding resource needs. [name] declined stating she had this BSW's contact number as well as CSW Di Kindle Saporito's number from a previous outreach. [name] stated "if I get to where I feel like I need you guys I will call". BSW to perform discipline closure as no other resource needs have been identified. BSW to alert [facility] RNCM  of discipline closure.  Daneen Schick, BSW, CDP [facility] [phone]

## 2018-10-03 NOTE — ED Provider Notes (Addendum)
[facility] EMERGENCY DEPARTMENT Provider Note   CSN: [id number] Arrival date & time: 10/03/18  2256     History   Chief Complaint Chief Complaint  Patient presents with  . Altered Mental Status    HPI [name] is a 68 y.o. male.  The history is provided by the patient and a relative. The history is limited by the condition of the patient (Altered mental status).  He has history of hypertension, diabetes, hyperlipidemia, coronary artery disease, combined systolic and diastolic heart failure, end-stage renal disease on hemodialysis and comes in because of weakness and confusion.  He states that he just feels terrible and cannot pinpoint what feels bad.  Family member state that he was weak and unable to stand.  He also had been confused at home.  He had complained of chest pain last night and this morning, but has been pain-free during the day.  He was not complaining of dyspnea, but EMS did note oxygen saturation in the 60s which came up nicely with supplemental oxygen.  He is not normally on oxygen at home although he does use CPAP at night.  He has not had any fever or chills.  He did have his scheduled dialysis yesterday and is scheduled for dialysis tomorrow.  He had recently been admitted for sepsis which was felt likely due to rectal ulcers from radiation proctitis, and he states that he does not feel like he ever got completely better.  Past Medical History:  Diagnosis Date  . Allergic rhinitis, cause unspecified 02/24/2014  . Anemia 06/16/2011  . BENIGN PROSTATIC HYPERTROPHY 10/14/2009  . CAD, NATIVE VESSEL 02/06/2009   a. 06/2007 s/p Taxus DES to the RCA;  b. 08/2016 NSTEMI in setting of SVT/PCI: LM 30ost, LAD 16m (3.0x16 Synergy DES), LCX 100m, OM1 60, RI 40, RCA 70p/m, 57m - not amenable to PCI.  [name] Cervical radiculopathy, chronic 02/23/2016   Right c5-6 by NCS/EMG  . Chronic combined systolic (congestive) and diastolic (congestive) heart failure ([place])    a.  10/2016 Echo: EF 40-45%, Gr2 DD. mildly dil LA.  [name] COLONIC POLYPS, HX OF 10/14/2009  . Dementia ([place]) [id number]  . Depression 09/24/2015  . DIABETES MELLITUS, TYPE II 02/01/2010  . DYSLIPIDEMIA 06/18/2007  . ESRD (end stage renal disease) on dialysis ([place]) 08/04/2010   ESRD due to DM adn HTN, started HD in 2011. As of 2019 has a R arm graft and gets HD on TTS sched  . FOOT PAIN 08/12/2008  . GAIT DISTURBANCE 03/03/2010  . GASTROENTERITIS, VIRAL 10/14/2009  . GERD 06/18/2007  . GOITER, MULTINODULAR 12/26/2007  . GOUT 06/18/2007  . GYNECOMASTIA 07/17/2010  . Hemodialysis access, fistula mature [facility])    Dialysis T-Th-Sa ([facility]) Right upper arm fistula  . Hyperlipidemia 10/16/2011  . Hyperparathyroidism, secondary ([place]) 06/16/2011  . HYPERTENSION 06/18/2007  . Hyperthyroidism   . Hypocalcemia 06/07/2010  . Ischemic cardiomyopathy    a. 10/2016 Echo: EF 40-45%.  . Lumbar stenosis with neurogenic claudication   . ONYCHOMYCOSIS, TOENAILS 12/26/2007  . OSA on CPAP 10/16/2011  . Other malaise and fatigue 11/24/2009  . PERIPHERAL NEUROPATHY 06/18/2007  . Prostate cancer ([place])   . PSVT (paroxysmal supraventricular tachycardia) ([place])    a. [id number]/5465 complicated by NSTEMI;  b. 11/2016 Treated w/ adenosine in ED;  c. 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  [name] PULMONARY NODULE, RIGHT LOWER LOBE 06/08/2009  . Sleep apnea    cpap machine and o2  . TRANSAMINASES, SERUM,  ELEVATED 02/01/2010  . Transfusion history    none recent  . Unspecified hypotension 01/30/2010    Patient Active Problem List   Diagnosis Date Noted  . Left wrist pain 09/03/2018  . C. difficile colitis 09/03/2018  . Altered mental status   . Gram-negative bacteremia   . Acute gouty arthritis 08/08/2018  . Sepsis ([place]) 07/16/2018  . Thrush 06/21/2018  . GI bleed 06/11/2018  . Recurrent falls 05/14/2018  . Chronic combined systolic (congestive) and diastolic (congestive) heart failure ([place]) 01/15/2018  . Abscess of left leg 10/14/2017    . Insomnia 10/14/2017  . Lumbar stenosis with neurogenic claudication 07/31/2017  . Lightheadedness   . Rectal bleeding   . Gait disorder 05/05/2017  . Chronic GI bleeding 04/22/2017  . Malaise and fatigue 12/27/2016  . Generalized weakness 12/27/2016  . History of PSVT (paroxysmal supraventricular tachycardia) 10/16/2016  . Abdominal pain 08/27/2016  . Abnormal findings on diagnostic imaging of liver and biliary tract 08/27/2016  . Bloating symptom 08/27/2016  . Hematochezia 08/27/2016  . Early satiety 08/27/2016  . Eructation 08/27/2016  . Esophageal ulcer 08/27/2016  . Flatulence, eructation and gas pain 08/27/2016  . General weakness 08/27/2016  . Skin sensation disturbance 08/27/2016  . Stricture of esophagus 08/27/2016  . Alternating constipation and diarrhea 08/16/2016  . Status post coronary artery stent placement   . ST segment depression 08/05/2016  . PAD (peripheral artery disease) ([place]) 06/18/2016  . Cervical radiculopathy, chronic 02/23/2016  . Memory loss 12/22/2015  . Left-sided weakness 12/22/2015  . Peripheral polyneuropathy 12/22/2015  . DM (diabetes mellitus) type II controlled with renal manifestation ([place]) 10/31/2015  . Nausea and vomiting 10/31/2015  . Malignant neoplasm of prostate ([place]) 10/24/2015  . Right leg pain 09/04/2015  . Chest pain 08/09/2015  . S/P right total knee replacement 08/09/2015  . Primary osteoarthritis of right knee 08/05/2015  . Neuropathy due to secondary diabetes mellitus ([place]) 08/05/2015  . Arthritis of knee 08/02/2015  . Headache 06/08/2015  . Melena 04/19/2015  . Acute blood loss anemia 04/18/2015  . Hyperthyroidism 03/11/2015  . Thigh pain 12/08/2014  . Nodule of right lung 09/14/2014  . PSA elevation 08/30/2014  . ESRD on hemodialysis ([place]) 08/16/2014  . Essential hypertension 08/16/2014  . Encounter for hemodialysis ([place]) 08/16/2014  . Allergic rhinitis 02/24/2014  . Elevated PSA 02/24/2014  . Vertigo 10/14/2013   . Cough 06/26/2013  . Numbness 05/15/2013  . Other complications due to renal dialysis device, implant, and graft 01/14/2013  . Right hand pain 03/28/2012  . Dysphagia 03/28/2012  . Hypogonadism, male 03/28/2012  . OSA (obstructive sleep apnea) 10/16/2011  . HLD (hyperlipidemia) 10/16/2011  . CAD S/P percutaneous coronary angioplasty 09/28/2011  . NSTEMI (non-ST elevated myocardial infarction) ([place]) 09/28/2011  . AVM (arteriovenous malformation) 09/27/2011  . Anemia associated with acute blood loss 09/26/2011  . Ischemic cardiomyopathy 06/16/2011  . Hyperparathyroidism, secondary ([place]) 06/16/2011  . Preventative health care 03/11/2011  . NECK PAIN 07/31/2010  . GYNECOMASTIA 07/17/2010  . DIZZINESS 07/17/2010  . GAIT DISTURBANCE 03/03/2010  . Thyrotoxicosis 02/02/2010  . Elevated levels of transaminase & lactic acid dehydrogenase 02/01/2010  . Other malaise and fatigue 11/24/2009  . Depression 10/14/2009  . BENIGN PROSTATIC HYPERTROPHY 10/14/2009  . COLONIC POLYPS, HX OF 10/14/2009  . Dementia ([place]) 09/02/2009  . PULMONARY NODULE, RIGHT LOWER LOBE 06/08/2009  . DYSPNEA 10/29/2008  . FOOT PAIN 08/12/2008  . ONYCHOMYCOSIS, TOENAILS 12/26/2007  . GOITER, MULTINODULAR 12/26/2007  . Gout 06/18/2007  . Neuropathy (  [place]) 06/18/2007  . Hypertensive heart disease with CHF (congestive heart failure) ([place]) 06/18/2007  . Gastro-esophageal reflux disease without esophagitis 06/18/2007    Past Surgical History:  Procedure Laterality Date  . A/V SHUNTOGRAM N/A 04/07/2018   Procedure: A/V SHUNTOGRAM;  Surgeon: Waynetta Sandy, MD;  Location: [facility];  Service: Cardiovascular;  Laterality: N/A;  . ARTERIOVENOUS GRAFT PLACEMENT Right 2009   forearm/notes 02/01/2011  . AV FISTULA PLACEMENT  11/07/2011   Procedure: INSERTION OF ARTERIOVENOUS (AV) GORE-TEX GRAFT ARM;  Surgeon: Tinnie Gens, MD;  Location: [facility];  Service: Vascular;  Laterality: Left;  . BACK SURGERY  1998  .  BASCILIC VEIN TRANSPOSITION Right 02/27/2013   Procedure: BASCILIC VEIN TRANSPOSITION;  Surgeon: Mal Misty, MD;  Location: [facility];  Service: Vascular;  Laterality: Right;  Right Basilic Vein Transposition   . CARDIAC CATHETERIZATION N/A 08/06/2016   Procedure: Left Heart Cath and Coronary Angiography;  Surgeon: Jolaine Artist, MD;  Location: [facility];  Service: Cardiovascular;  Laterality: N/A;  . CARDIAC CATHETERIZATION N/A 08/07/2016   Procedure: Coronary/Graft Atherectomy-CSI LAD;  Surgeon: Peter M Martinique, MD;  Location: [facility];  Service: Cardiovascular;  Laterality: N/A;  . CERVICAL SPINE SURGERY  2/09   "to repair nerve problems in my left arm"  . CHOLECYSTECTOMY    . COLONOSCOPY WITH PROPOFOL N/A 04/26/2017   Procedure: COLONOSCOPY WITH PROPOFOL;  Surgeon: Otis Brace, MD;  Location: [facility];  Service: Gastroenterology;  Laterality: N/A;  . CORONARY ANGIOPLASTY WITH STENT PLACEMENT  06/11/2008  . CORONARY ANGIOPLASTY WITH STENT PLACEMENT  06/2007   TAXUS stent to RCA/notes 01/31/2011  . ESOPHAGOGASTRODUODENOSCOPY  09/28/2011   Procedure: ESOPHAGOGASTRODUODENOSCOPY (EGD);  Surgeon: Missy Sabins, MD;  Location: [facility] ENDOSCOPY;  Service: Endoscopy;  Laterality: N/A;  . ESOPHAGOGASTRODUODENOSCOPY N/A 04/07/2015   Procedure: ESOPHAGOGASTRODUODENOSCOPY (EGD);  Surgeon: Teena Irani, MD;  Location: [facility];  Service: Endoscopy;  Laterality: N/A;  . ESOPHAGOGASTRODUODENOSCOPY N/A 04/19/2015   Procedure: ESOPHAGOGASTRODUODENOSCOPY (EGD);  Surgeon: Arta Silence, MD;  Location[facility] END[facility]COPY;  Service: Endoscopy;  Laterality: N/A;  . ESOPHAGOGASTRODUODENOSCOPY (EGD) WITH PROPOFOL N/A 06/13/2018   Procedure: ESOPHAGOGASTRODUODENOSCOPY (EGD) WITH PROPOFOL;  Surgeon: Wonda Horner, MD;  Location: [facility] ENDOSCOPY;  Service: Endoscopy;  Laterality: N/A;  . FLEXIBLE SIGMOIDOSCOPY N/A 05/21/2017   Procedure: Conni Elliot;  Surgeon: Clarene Essex, MD;  Location: [facility];  Service: Endoscopy;  Laterality: N/A;  . FLEXIBLE SIGMOIDOSCOPY Left 07/02/2017   Procedure: FLEXIBLE SIGMOIDOSCOPY;  Surgeon: Laurence Spates, MD;  Location: [facility];  Service: Endoscopy;  Laterality: Left;  . FOREIGN BODY REMOVAL  09/2003   via upper endoscopy/notes 02/12/2011  . GIVENS CAPSULE STUDY  09/30/2011   Procedure: GIVENS CAPSULE STUDY;  Surgeon: Jeryl [place], MD;  Location: [facility] ENDOSCOPY;  Service: Endoscopy;  Laterality: N/A;  . INSERTION OF DIALYSIS CATHETER Right 2014  . INSERTION OF DIALYSIS CATHETER Left 02/11/2013   Procedure: INSERTION OF DIALYSIS CATHETER;  Surgeon: Conrad [place], MD;  Location: [facility];  Service: Vascular;  Laterality: Left;  Ultrasound guided  . LUMBAR LAMINECTOMY/DECOMPRESSION MICRODISCECTOMY Bilateral 07/31/2017   Procedure: LAMINECTOMY AND FORAMINOTOMY- BILATERAL LUMBAR TWO- LUMBAR THREE;  Surgeon: Earnie Larsson, MD;  Location: [facility];  Service: Neurosurgery;  Laterality: Bilateral;  LAMINECTOMY AND FORAMINOTOMY- BILATERAL LUMBAR 2- LUMBAR 3  . PERIPHERAL VASCULAR BALLOON ANGIOPLASTY  04/07/2018   Procedure: PERIPHERAL VASCULAR BALLOON ANGIOPLASTY;  Surgeon: Waynetta Sandy, MD;  Location: [facility] CV LAB;  Service: Cardiovascular;;  RUE AVF  . REMOVAL OF A DIALYSIS CATHETER Right 02/11/2013   Procedure: REMOVAL OF A DIALYSIS CATHETER;  Surgeon: Conrad Warren, MD;  Location: [facility];  Service: Vascular;  Laterality: Right;  . SAVORY DILATION N/A 04/07/2015   Procedure: SAVORY DILATION;  Surgeon: Teena Irani, MD;  Location: [facility] ENDOSCOPY;  Service: Endoscopy;  Laterality: N/A;  . SHUNTOGRAM N/A 09/20/2011   Procedure: Earney Mallet;  Surgeon: Conrad [place], MD;  Location: [facility] CATH LAB;  Service: Cardiovascular;  Laterality: N/A;  . SVT ABLATION N/A 11/26/2016   Procedure: SVT Ablation;  Surgeon: Will Meredith Leeds, MD;  Location: [facility];  Service: Cardiovascular;  Laterality: N/A;  . TEE WITHOUT CARDIOVERSION N/A 08/25/2018   Procedure:  TRANSESOPHAGEAL ECHOCARDIOGRAM (TEE);  Surgeon: Dorothy Spark, MD;  Location: [facility] ENDOSCOPY;  Service: Cardiovascular;  Laterality: N/A;  . TONSILLECTOMY    . TOTAL KNEE ARTHROPLASTY Right 08/02/2015   Procedure: TOTAL KNEE ARTHROPLASTY;  Surgeon: Renette Butters, MD;  Location: [facility];  Service: Orthopedics;  Laterality: Right;  . VENOGRAM N/A 01/26/2013   Procedure: VENOGRAM;  Surgeon: Angelia Mould, MD;  Location: [facility] CATH LAB;  Service: Cardiovascular;  Laterality: N/A;        Home Medications    Prior to Admission medications   Medication Sig Start Date End Date Taking? Authorizing Provider  acetaminophen (TYLENOL) 325 MG tablet Take 650 mg by mouth every 6 (six) hours as needed for mild pain or headache.     [provider]  Alirocumab (PRALUENT) 150 MG/ML SOPN Inject 150 mg into the skin every 14 (fourteen) days. 03/05/18   Lorretta Harp, MD  allopurinol (ZYLOPRIM) 100 MG tablet TAKE ONE TABLET BY MOUTH ONCE DAILY Patient taking differently: Take 100 mg by mouth daily.  10/14/17   Biagio Borg, MD  aspirin EC 81 MG tablet Take 81 mg by mouth daily.    [provider]  betamethasone dipropionate (DIPROLENE) 0.05 % cream Apply 1 application topically daily as needed (leg sores).    [provider]  citalopram (CELEXA) 10 MG tablet Take 1 tablet (10 mg total) by mouth at bedtime. 10/14/17   Biagio Borg, MD  colchicine 0.6 MG tablet Take 1 tablet (0.6 mg total) by mouth daily. 08/08/18   Biagio Borg, MD  doxercalciferol ([name]) 4 MCG/2ML injection Inject 1 mL (2 mcg total) into the vein Every Tuesday,Thursday,and Saturday with dialysis. 09/02/18   Kayleen Memos, DO  ezetimibe (ZETIA) 10 MG tablet TAKE ONE TABLET BY MOUTH ONCE DAILY 04/08/17   Bensimhon, Shaune Pascal, MD  ferric citrate (AURYXIA) 1 GM 210 MG(Fe) tablet Take 2 tablets (420 mg total) by mouth 3 (three) times daily with meals. 08/30/18   Kayleen Memos, DO  gabapentin (NEURONTIN) 100 MG  capsule Take 1 capsule (100 mg total) by mouth at bedtime. 08/31/18   Kayleen Memos, DO  hydroxypropyl methylcellulose / hypromellose (ISOPTO TEARS / GONIOVISC) 2.5 % ophthalmic solution Place 1 drop into both eyes 3 (three) times daily as needed for dry eyes. 05/03/17   Biagio Borg, MD  methimazole (TAPAZOLE) 5 MG tablet Take 0.5 tablets (2.5 mg total) by mouth daily. 05/16/18   Renato Shin, MD  multivitamin (RENA-VIT) TABS tablet Take 1 tablet by mouth daily. Reported on 10/24/2015    [provider]  Nutritional Supplements (FEEDING SUPPLEMENT, NEPRO CARB STEADY,) LIQD Take 237 mLs by mouth 2 (two) times daily between meals. 08/30/18   [place] Crane,  Carole N, DO  pantoprazole (PROTONIX) 40 MG tablet Take 40 mg by mouth daily.    [provider]  polyethylene glycol (MIRALAX / GLYCOLAX) packet Take 17 g by mouth daily. 05/23/17   Florencia Reasons, MD  SENSIPAR 90 MG tablet Take 90 mg by mouth 2 (two) times daily. 09/18/16   [provider]  WELCHOL 625 MG tablet TAKE THREE TABLETS BY MOUTH TWICE DAILY WITH MEALS Patient taking differently: Take 1,875 mg by mouth 2 (two) times daily with a meal.  10/04/17   Biagio Borg, MD  zolpidem (AMBIEN) 5 MG tablet Take 1 tablet (5 mg total) by mouth at bedtime as needed. for sleep Patient taking differently: Take 5 mg by mouth at bedtime as needed for sleep.  06/20/18   Biagio Borg, MD    Family History Family History  Problem Relation Age of Onset  . Heart disease Sister   . Thyroid nodules Sister   . Heart disease Father   . Diabetes Father   . Kidney failure Father   . Hypertension Father   . Healthy Child   . Healthy Child   . Healthy Child   . Cancer Neg Hx     Social History Social History   Tobacco Use  . Smoking status: Former Smoker    Packs/day: 1.00    Years: 25.00    Pack years: 25.00    Types: Cigarettes    Last attempt to quit: 10/01/2005    Years since quitting: 13.0  . Smokeless tobacco: Former Systems developer    Quit  date: 10/01/2005  . Tobacco comment: Quit smoking 2007 Smoked x 25 years 1/2 ppd.  Substance Use Topics  . Alcohol use: No    Alcohol/week: 0.0 standard drinks  . Drug use: No     Allergies   Cephalexin; Statins; and Ciprofloxacin   Review of Systems Review of Systems  All other systems reviewed and are negative.    Physical Exam Updated Vital Signs BP (!) 101/54 (BP Location: Left Arm)   Pulse (!) 105   Temp 99.6 F (37.6 C) (Oral)   Resp 20   Ht 6\' 1"  (1.854 m)   Wt 98 kg   SpO2 93%   BMI 28.50 kg/m   Physical Exam Vitals signs and nursing note reviewed.    68 year old male, resting comfortably and in no acute distress. Vital signs are significant for mildly elevated heart rate. Oxygen saturation is 93%, which is normal. Head is normocephalic and atraumatic. PERRLA, EOMI. Oropharynx is clear. Neck is nontender and supple without adenopathy or JVD. Back is nontender and there is no CVA tenderness. Lungs are clear without rales, wheezes, or rhonchi. Chest is nontender. Heart has regular rate and rhythm without murmur. Abdomen is soft, flat, nontender without masses or hepatosplenomegaly and peristalsis is normoactive. Extremities have no cyanosis or edema, full range of motion is present.  AV fistula is present in the right upper arm with thrill present. Skin is warm and dry without rash. Neurologic: Mental status is normal - oriented x3, cranial nerves are intact, there are no motor or sensory deficits.  ED Treatments / Results  Labs (all labs ordered are listed, but only abnormal results are displayed) Labs Reviewed  COMPREHENSIVE METABOLIC PANEL - Abnormal; Notable for the following components:      Result Value   Glucose, Bld 190 (*)    BUN 43 (*)    Creatinine, Ser 9.70 (*)    Albumin 3.1 (*)  AST 471 (*)    ALT 439 (*)    Alkaline Phosphatase 235 (*)    Total Bilirubin 2.1 (*)    GFR calc non Af Amer 5 (*)    GFR calc Af Amer 6 (*)    All other  components within normal limits  CBC WITH DIFFERENTIAL/PLATELET - Abnormal; Notable for the following components:   WBC 11.8 (*)    Hemoglobin 12.4 (*)    MCHC 29.9 (*)    RDW 17.9 (*)    Neutro Abs 9.7 (*)    Monocytes Absolute 1.3 (*)    Abs Immature Granulocytes 0.09 (*)    All other components within normal limits  CBG MONITORING, ED - Abnormal; Notable for the following components:   Glucose-Capillary 186 (*)    All other components within normal limits  I-STAT CG4 LACTIC ACID, ED - Abnormal; Notable for the following components:   Lactic Acid, Venous 2.71 (*)    All other components within normal limits  CULTURE, BLOOD (ROUTINE X 2)  CULTURE, BLOOD (ROUTINE X 2)  CK  MAGNESIUM  HEPATITIS PANEL, ACUTE  D-DIMER, QUANTITATIVE (NOT AT [facility])  INFLUENZA PANEL BY PCR (TYPE A & B)  CBG MONITORING, ED  I-STAT TROPONIN, ED  I-STAT CG4 LACTIC ACID, ED    EKG EKG Interpretation  Date/Time:  Friday October 03 2018 23:06:42 EST Ventricular Rate:  103 PR Interval:    QRS Duration: 99 QT Interval:  357 QTC Calculation: 468 R Axis:   48 Text Interpretation:  Sinus tachycardia Borderline T abnormalities, lateral leads When compared with ECG of 08/19/2018, Nonspecific T wave abnormality is now present Confirmed by Delora Fuel ([id number]) on 10/03/2018 11:13:19 PM   Radiology Dg Chest 2 View  Result Date: 10/03/2018 CLINICAL DATA:  Altered mental status EXAM: CHEST - 2 VIEW COMPARISON:  08/23/2018 FINDINGS: Mild cardiomegaly. No acute consolidation or effusion. No pneumothorax. Postsurgical changes in the cervical spine. IMPRESSION: No active cardiopulmonary disease.  Cardiomegaly. Electronically Signed   By: Donavan Foil M.D.   On: 10/03/2018 23:59    Procedures Procedures   Medications Ordered in ED Medications - No data to display   Initial Impression / Assessment and Plan / ED Course  I have reviewed the triage vital signs and the nursing notes.  Pertinent labs & imaging  results that were available during my care of the patient were reviewed by me and considered in my medical decision making (see chart for details).  Malaise and confusion of uncertain cause.  Old records are reviewed confirming hospitalization 11/19 through 12/1 for gram-negative bacteremia, also had C. difficile treated with vancomycin with apparent resolution.  ECG shows no acute changes.  With episode of chest pain last night, single troponin will be adequate to rule out ACS.  Will check chest x-ray.  Chest x-ray shows cardiomegaly, but no acute process.  Troponin is normal.  Lactic acid is mildly elevated, not felt to represent sepsis, probably related to underlying renal failure.  Because he is anuric and history of heart failure, I am reluctant to give him fluid bolus in the absence of sepsis.  Metabolic panel is significant for elevation of transaminases to the 400s, elevation of alkaline phosphatase to 235, elevation of bilirubin to 3.1 compared to baseline of 2.3.  This is felt to represent acute hepatitis.  Hepatitis panel has been sent.  Patient was reevaluated and was resting comfortably.  However, when he was taken off of supplemental oxygen, oxygen saturation dropped to 87%.  He does not have oxygen at home.  Cause for hypoxia is not clear, possibly representing subclinical pulmonary edema.  He is placed back on oxygen and will be admitted.  Will check d-dimer to rule out pulmonary embolism.  Case is discussed with Dr. Hal Hope of [facility] hospitalist who agrees to admit the patient.  He requests CT of head be obtained given his history of confusion.  This is ordered.  Final Clinical Impressions(s) / ED Diagnoses   Final diagnoses:  Hypoxia  Acute hepatitis  Confusion  End-stage renal disease on hemodialysis ([place])  Anemia associated with chronic renal failure  Elevated lactic acid level    ED Discharge Orders    None       Delora Fuel, MD 16/10/96 [id number]    Delora Fuel,  MD 09/81/19 [phone]

## 2018-10-04 ENCOUNTER — Inpatient Hospital Stay (HOSPITAL_COMMUNITY): Payer: Medicare Other

## 2018-10-04 ENCOUNTER — Encounter (HOSPITAL_COMMUNITY): Payer: Self-pay | Admitting: Internal Medicine

## 2018-10-04 DIAGNOSIS — N186 End stage renal disease: Secondary | ICD-10-CM | POA: Diagnosis present

## 2018-10-04 DIAGNOSIS — R7989 Other specified abnormal findings of blood chemistry: Secondary | ICD-10-CM

## 2018-10-04 DIAGNOSIS — B961 Klebsiella pneumoniae [K. pneumoniae] as the cause of diseases classified elsewhere: Secondary | ICD-10-CM | POA: Diagnosis present

## 2018-10-04 DIAGNOSIS — Z992 Dependence on renal dialysis: Secondary | ICD-10-CM | POA: Diagnosis not present

## 2018-10-04 DIAGNOSIS — R0902 Hypoxemia: Secondary | ICD-10-CM | POA: Diagnosis not present

## 2018-10-04 DIAGNOSIS — I252 Old myocardial infarction: Secondary | ICD-10-CM | POA: Diagnosis not present

## 2018-10-04 DIAGNOSIS — R410 Disorientation, unspecified: Secondary | ICD-10-CM | POA: Diagnosis not present

## 2018-10-04 DIAGNOSIS — G9340 Encephalopathy, unspecified: Secondary | ICD-10-CM | POA: Diagnosis not present

## 2018-10-04 DIAGNOSIS — M25532 Pain in left wrist: Secondary | ICD-10-CM | POA: Diagnosis not present

## 2018-10-04 DIAGNOSIS — W19XXXA Unspecified fall, initial encounter: Secondary | ICD-10-CM | POA: Diagnosis not present

## 2018-10-04 DIAGNOSIS — R4182 Altered mental status, unspecified: Secondary | ICD-10-CM | POA: Diagnosis not present

## 2018-10-04 DIAGNOSIS — E1122 Type 2 diabetes mellitus with diabetic chronic kidney disease: Secondary | ICD-10-CM | POA: Diagnosis present

## 2018-10-04 DIAGNOSIS — I639 Cerebral infarction, unspecified: Secondary | ICD-10-CM

## 2018-10-04 DIAGNOSIS — S91312A Laceration without foreign body, left foot, initial encounter: Secondary | ICD-10-CM | POA: Diagnosis not present

## 2018-10-04 DIAGNOSIS — N2581 Secondary hyperparathyroidism of renal origin: Secondary | ICD-10-CM | POA: Diagnosis present

## 2018-10-04 DIAGNOSIS — Z881 Allergy status to other antibiotic agents status: Secondary | ICD-10-CM | POA: Diagnosis not present

## 2018-10-04 DIAGNOSIS — Z87311 Personal history of (healed) other pathological fracture: Secondary | ICD-10-CM | POA: Diagnosis not present

## 2018-10-04 DIAGNOSIS — I770 Arteriovenous fistula, acquired: Secondary | ICD-10-CM | POA: Diagnosis not present

## 2018-10-04 DIAGNOSIS — E1142 Type 2 diabetes mellitus with diabetic polyneuropathy: Secondary | ICD-10-CM | POA: Diagnosis present

## 2018-10-04 DIAGNOSIS — Z8619 Personal history of other infectious and parasitic diseases: Secondary | ICD-10-CM | POA: Diagnosis not present

## 2018-10-04 DIAGNOSIS — Z981 Arthrodesis status: Secondary | ICD-10-CM | POA: Diagnosis not present

## 2018-10-04 DIAGNOSIS — B179 Acute viral hepatitis, unspecified: Secondary | ICD-10-CM | POA: Insufficient documentation

## 2018-10-04 DIAGNOSIS — N400 Enlarged prostate without lower urinary tract symptoms: Secondary | ICD-10-CM | POA: Diagnosis present

## 2018-10-04 DIAGNOSIS — M5412 Radiculopathy, cervical region: Secondary | ICD-10-CM | POA: Diagnosis present

## 2018-10-04 DIAGNOSIS — D631 Anemia in chronic kidney disease: Secondary | ICD-10-CM | POA: Diagnosis present

## 2018-10-04 DIAGNOSIS — J9601 Acute respiratory failure with hypoxia: Secondary | ICD-10-CM | POA: Diagnosis present

## 2018-10-04 DIAGNOSIS — R079 Chest pain, unspecified: Secondary | ICD-10-CM | POA: Diagnosis not present

## 2018-10-04 DIAGNOSIS — A0471 Enterocolitis due to Clostridium difficile, recurrent: Secondary | ICD-10-CM | POA: Diagnosis not present

## 2018-10-04 DIAGNOSIS — K219 Gastro-esophageal reflux disease without esophagitis: Secondary | ICD-10-CM | POA: Diagnosis present

## 2018-10-04 DIAGNOSIS — E785 Hyperlipidemia, unspecified: Secondary | ICD-10-CM | POA: Diagnosis present

## 2018-10-04 DIAGNOSIS — I5042 Chronic combined systolic (congestive) and diastolic (congestive) heart failure: Secondary | ICD-10-CM | POA: Diagnosis present

## 2018-10-04 DIAGNOSIS — Z1611 Resistance to penicillins: Secondary | ICD-10-CM | POA: Diagnosis present

## 2018-10-04 DIAGNOSIS — K838 Other specified diseases of biliary tract: Secondary | ICD-10-CM | POA: Diagnosis not present

## 2018-10-04 DIAGNOSIS — R7881 Bacteremia: Secondary | ICD-10-CM | POA: Diagnosis present

## 2018-10-04 DIAGNOSIS — K0889 Other specified disorders of teeth and supporting structures: Secondary | ICD-10-CM | POA: Diagnosis not present

## 2018-10-04 DIAGNOSIS — R109 Unspecified abdominal pain: Secondary | ICD-10-CM | POA: Diagnosis not present

## 2018-10-04 DIAGNOSIS — J309 Allergic rhinitis, unspecified: Secondary | ICD-10-CM | POA: Diagnosis present

## 2018-10-04 DIAGNOSIS — Z888 Allergy status to other drugs, medicaments and biological substances status: Secondary | ICD-10-CM | POA: Diagnosis not present

## 2018-10-04 DIAGNOSIS — R911 Solitary pulmonary nodule: Secondary | ICD-10-CM | POA: Diagnosis present

## 2018-10-04 DIAGNOSIS — Z8719 Personal history of other diseases of the digestive system: Secondary | ICD-10-CM | POA: Diagnosis not present

## 2018-10-04 DIAGNOSIS — I63449 Cerebral infarction due to embolism of unspecified cerebellar artery: Secondary | ICD-10-CM | POA: Diagnosis not present

## 2018-10-04 DIAGNOSIS — I63541 Cerebral infarction due to unspecified occlusion or stenosis of right cerebellar artery: Secondary | ICD-10-CM | POA: Diagnosis not present

## 2018-10-04 DIAGNOSIS — I6381 Other cerebral infarction due to occlusion or stenosis of small artery: Secondary | ICD-10-CM | POA: Diagnosis present

## 2018-10-04 DIAGNOSIS — E0590 Thyrotoxicosis, unspecified without thyrotoxic crisis or storm: Secondary | ICD-10-CM | POA: Diagnosis not present

## 2018-10-04 DIAGNOSIS — I132 Hypertensive heart and chronic kidney disease with heart failure and with stage 5 chronic kidney disease, or end stage renal disease: Secondary | ICD-10-CM | POA: Diagnosis present

## 2018-10-04 DIAGNOSIS — F329 Major depressive disorder, single episode, unspecified: Secondary | ICD-10-CM | POA: Diagnosis present

## 2018-10-04 DIAGNOSIS — I2510 Atherosclerotic heart disease of native coronary artery without angina pectoris: Secondary | ICD-10-CM | POA: Diagnosis present

## 2018-10-04 DIAGNOSIS — A0472 Enterocolitis due to Clostridium difficile, not specified as recurrent: Secondary | ICD-10-CM | POA: Diagnosis present

## 2018-10-04 DIAGNOSIS — M109 Gout, unspecified: Secondary | ICD-10-CM | POA: Diagnosis present

## 2018-10-04 DIAGNOSIS — F0390 Unspecified dementia without behavioral disturbance: Secondary | ICD-10-CM | POA: Diagnosis present

## 2018-10-04 HISTORY — DX: Encephalopathy, unspecified: G93.40

## 2018-10-04 LAB — D-DIMER, QUANTITATIVE: D-Dimer, Quant: 4.52 ug/mL-FEU — ABNORMAL HIGH (ref 0.00–0.50)

## 2018-10-04 LAB — BLOOD CULTURE ID PANEL (REFLEXED)
Acinetobacter baumannii: NOT DETECTED
Candida albicans: NOT DETECTED
Candida glabrata: NOT DETECTED
Candida krusei: NOT DETECTED
Candida parapsilosis: NOT DETECTED
Candida tropicalis: NOT DETECTED
Carbapenem resistance: NOT DETECTED
Enterobacter cloacae complex: NOT DETECTED
Enterobacteriaceae species: DETECTED — AB
Enterococcus species: NOT DETECTED
Escherichia coli: NOT DETECTED
Haemophilus influenzae: NOT DETECTED
KLEBSIELLA PNEUMONIAE: DETECTED — AB
Klebsiella oxytoca: NOT DETECTED
LISTERIA MONOCYTOGENES: NOT DETECTED
Neisseria meningitidis: NOT DETECTED
PSEUDOMONAS AERUGINOSA: NOT DETECTED
Proteus species: NOT DETECTED
STAPHYLOCOCCUS SPECIES: NOT DETECTED
Serratia marcescens: NOT DETECTED
Staphylococcus aureus (BCID): NOT DETECTED
Streptococcus agalactiae: NOT DETECTED
Streptococcus pneumoniae: NOT DETECTED
Streptococcus pyogenes: NOT DETECTED
Streptococcus species: NOT DETECTED

## 2018-10-04 LAB — LIPID PANEL
Cholesterol: 171 mg/dL (ref 0–200)
HDL: 66 mg/dL (ref >40)
LDL Cholesterol: 68 mg/dL (ref 0–99)
Total CHOL/HDL Ratio: 2.6 RATIO
Triglycerides: 185 mg/dL — ABNORMAL HIGH (ref <150)
VLDL: 37 mg/dL (ref 0–40)

## 2018-10-04 LAB — GLUCOSE, CAPILLARY: Glucose-Capillary: 115 mg/dL — ABNORMAL HIGH (ref 70–99)

## 2018-10-04 LAB — CBC WITH DIFFERENTIAL/PLATELET
Abs Immature Granulocytes: 0.09 10*3/uL — ABNORMAL HIGH (ref 0.00–0.07)
Basophils Absolute: 0 10*3/uL (ref 0.0–0.1)
Basophils Relative: 0 %
EOS ABS: 0 10*3/uL (ref 0.0–0.5)
EOS PCT: 0 %
HCT: 41.5 % (ref 39.0–52.0)
Hemoglobin: 12.4 g/dL — ABNORMAL LOW (ref 13.0–17.0)
Immature Granulocytes: 1 %
Lymphocytes Relative: 6 %
Lymphs Abs: 0.7 10*3/uL (ref 0.7–4.0)
MCH: 28.6 pg (ref 26.0–34.0)
MCHC: 29.9 g/dL — AB (ref 30.0–36.0)
MCV: 95.6 fL (ref 80.0–100.0)
Monocytes Absolute: 1.3 10*3/uL — ABNORMAL HIGH (ref 0.1–1.0)
Monocytes Relative: 11 %
Neutro Abs: 9.7 10*3/uL — ABNORMAL HIGH (ref 1.7–7.7)
Neutrophils Relative %: 82 %
Platelets: 263 10*3/uL (ref 150–400)
RBC: 4.34 MIL/uL (ref 4.22–5.81)
RDW: 17.9 % — ABNORMAL HIGH (ref 11.5–15.5)
WBC: 11.8 10*3/uL — ABNORMAL HIGH (ref 4.0–10.5)
nRBC: 0 % (ref 0.0–0.2)

## 2018-10-04 LAB — CBC
HCT: 39.5 % (ref 39.0–52.0)
Hemoglobin: 11.9 g/dL — ABNORMAL LOW (ref 13.0–17.0)
MCH: 28.6 pg (ref 26.0–34.0)
MCHC: 30.1 g/dL (ref 30.0–36.0)
MCV: 95 fL (ref 80.0–100.0)
Platelets: 285 10*3/uL (ref 150–400)
RBC: 4.16 MIL/uL — ABNORMAL LOW (ref 4.22–5.81)
RDW: 18.1 % — ABNORMAL HIGH (ref 11.5–15.5)
WBC: 16.7 10*3/uL — ABNORMAL HIGH (ref 4.0–10.5)
nRBC: 0 % (ref 0.0–0.2)

## 2018-10-04 LAB — BASIC METABOLIC PANEL
ANION GAP: 14 (ref 5–15)
BUN: 47 mg/dL — ABNORMAL HIGH (ref 8–23)
CO2: 26 mmol/L (ref 22–32)
CREATININE: 10.43 mg/dL — AB (ref 0.61–1.24)
Calcium: 9.3 mg/dL (ref 8.9–10.3)
Chloride: 98 mmol/L (ref 98–111)
GFR calc non Af Amer: 4 mL/min — ABNORMAL LOW (ref >60)
GFR, EST AFRICAN AMERICAN: 5 mL/min — AB (ref >60)
Glucose, Bld: 176 mg/dL — ABNORMAL HIGH (ref 70–99)
Potassium: 6.1 mmol/L — ABNORMAL HIGH (ref 3.5–5.1)
Sodium: 138 mmol/L (ref 135–145)

## 2018-10-04 LAB — I-STAT CG4 LACTIC ACID, ED: Lactic Acid, Venous: 2.03 mmol/L (ref 0.5–1.9)

## 2018-10-04 LAB — INFLUENZA PANEL BY PCR (TYPE A & B)
Influenza A By PCR: NEGATIVE
Influenza B By PCR: NEGATIVE

## 2018-10-04 LAB — CBG MONITORING, ED: Glucose-Capillary: 138 mg/dL — ABNORMAL HIGH (ref 70–99)

## 2018-10-04 LAB — HEMOGLOBIN A1C
Hgb A1c MFr Bld: 6 % — ABNORMAL HIGH (ref 4.8–5.6)
Mean Plasma Glucose: 125.5 mg/dL

## 2018-10-04 LAB — COMPREHENSIVE METABOLIC PANEL
ALT: 439 U/L — ABNORMAL HIGH (ref 0–44)
AST: 471 U/L — ABNORMAL HIGH (ref 15–41)
Albumin: 3.1 g/dL — ABNORMAL LOW (ref 3.5–5.0)
Alkaline Phosphatase: 235 U/L — ABNORMAL HIGH (ref 38–126)
Anion gap: 15 (ref 5–15)
BUN: 43 mg/dL — AB (ref 8–23)
CO2: 25 mmol/L (ref 22–32)
Calcium: 9.1 mg/dL (ref 8.9–10.3)
Chloride: 100 mmol/L (ref 98–111)
Creatinine, Ser: 9.7 mg/dL — ABNORMAL HIGH (ref 0.61–1.24)
GFR calc Af Amer: 6 mL/min — ABNORMAL LOW (ref >60)
GFR calc non Af Amer: 5 mL/min — ABNORMAL LOW (ref >60)
GLUCOSE: 190 mg/dL — AB (ref 70–99)
Potassium: 5 mmol/L (ref 3.5–5.1)
Sodium: 140 mmol/L (ref 135–145)
TOTAL PROTEIN: 7.4 g/dL (ref 6.5–8.1)
Total Bilirubin: 2.1 mg/dL — ABNORMAL HIGH (ref 0.3–1.2)

## 2018-10-04 LAB — MRSA PCR SCREENING: MRSA by PCR: NEGATIVE

## 2018-10-04 LAB — TROPONIN I
Troponin I: 0.04 ng/mL (ref <0.03)
Troponin I: 0.04 ng/mL (ref <0.03)
Troponin I: 0.04 ng/mL (ref <0.03)

## 2018-10-04 LAB — MAGNESIUM: Magnesium: 2 mg/dL (ref 1.7–2.4)

## 2018-10-04 LAB — CK: Total CK: 347 U/L (ref 49–397)

## 2018-10-04 MED ORDER — SODIUM CHLORIDE 0.9 % IV SOLN: 100.0000 mL | INTRAVENOUS | Status: DC | PRN

## 2018-10-04 MED ORDER — ALTEPLASE 2 MG IJ SOLR: 2.0000 mg | Freq: Once | INTRAMUSCULAR | Status: DC | PRN

## 2018-10-04 MED ORDER — VANCOMYCIN HCL 125 MG PO CAPS: 125.0000 mg | ORAL_CAPSULE | ORAL | Status: DC

## 2018-10-04 MED ORDER — ACETAMINOPHEN 325 MG PO TABS
650.0000 mg | ORAL_TABLET | Freq: Four times a day (QID) | ORAL | Status: DC | PRN
  Administered 2018-10-04 – 2018-10-07 (×3): 650 mg via ORAL
  Filled 2018-10-04 (×3): qty 2

## 2018-10-04 MED ORDER — VANCOMYCIN 50 MG/ML ORAL SOLUTION
125.0000 mg | ORAL | Status: DC
  Administered 2018-10-08: 125 mg via ORAL
  Filled 2018-10-04: qty 2.5

## 2018-10-04 MED ORDER — ASPIRIN 300 MG RE SUPP: 300.0000 mg | Freq: Every day | RECTAL | Status: DC

## 2018-10-04 MED ORDER — INSULIN ASPART 100 UNIT/ML ~~LOC~~ SOLN
0.0000 [IU] | Freq: Three times a day (TID) | SUBCUTANEOUS | Status: DC
  Administered 2018-10-05: 2 [IU] via SUBCUTANEOUS
  Administered 2018-10-05 – 2018-10-06 (×2): 1 [IU] via SUBCUTANEOUS
  Administered 2018-10-06: 2 [IU] via SUBCUTANEOUS
  Administered 2018-10-06 – 2018-10-07 (×2): 1 [IU] via SUBCUTANEOUS
  Administered 2018-10-08: 2 [IU] via SUBCUTANEOUS
  Administered 2018-10-08: 1 [IU] via SUBCUTANEOUS
  Administered 2018-10-08: 2 [IU] via SUBCUTANEOUS

## 2018-10-04 MED ORDER — ONDANSETRON HCL 4 MG/2ML IJ SOLN: 4.0000 mg | Freq: Four times a day (QID) | INTRAMUSCULAR | Status: DC | PRN

## 2018-10-04 MED ORDER — VANCOMYCIN 50 MG/ML ORAL SOLUTION
125.0000 mg | ORAL | Status: AC
  Administered 2018-10-04 – 2018-10-06 (×2): 125 mg via ORAL
  Filled 2018-10-04 (×3): qty 2.5

## 2018-10-04 MED ORDER — HEPARIN SODIUM (PORCINE) 1000 UNIT/ML DIALYSIS: 1000.0000 [IU] | INTRAMUSCULAR | Status: DC | PRN

## 2018-10-04 MED ORDER — GABAPENTIN 100 MG PO CAPS
100.0000 mg | ORAL_CAPSULE | Freq: Every day | ORAL | Status: DC
  Administered 2018-10-05 – 2018-10-08 (×5): 100 mg via ORAL
  Filled 2018-10-04 (×5): qty 1

## 2018-10-04 MED ORDER — STROKE: EARLY STAGES OF RECOVERY BOOK
Freq: Once | Status: AC
  Administered 2018-10-04: 1
  Filled 2018-10-04: qty 1

## 2018-10-04 MED ORDER — LIDOCAINE-PRILOCAINE 2.5-2.5 % EX CREA: 1.0000 "application " | TOPICAL_CREAM | CUTANEOUS | Status: DC | PRN

## 2018-10-04 MED ORDER — LIDOCAINE HCL (PF) 1 % IJ SOLN: 5.0000 mL | INTRAMUSCULAR | Status: DC | PRN

## 2018-10-04 MED ORDER — EZETIMIBE 10 MG PO TABS
10.0000 mg | ORAL_TABLET | Freq: Every day | ORAL | Status: DC
  Administered 2018-10-05 – 2018-10-09 (×4): 10 mg via ORAL
  Filled 2018-10-04 (×4): qty 1

## 2018-10-04 MED ORDER — COLCHICINE 0.6 MG PO TABS
0.6000 mg | ORAL_TABLET | Freq: Every day | ORAL | Status: DC
  Administered 2018-10-04 – 2018-10-09 (×5): 0.6 mg via ORAL
  Filled 2018-10-04 (×5): qty 1

## 2018-10-04 MED ORDER — FERRIC CITRATE 1 GM 210 MG(FE) PO TABS
420.0000 mg | ORAL_TABLET | Freq: Three times a day (TID) | ORAL | Status: DC
  Administered 2018-10-04 – 2018-10-09 (×12): 420 mg via ORAL
  Filled 2018-10-04 (×18): qty 2

## 2018-10-04 MED ORDER — PREDNISONE 10 MG PO TABS
10.0000 mg | ORAL_TABLET | Freq: Two times a day (BID) | ORAL | Status: DC
  Administered 2018-10-04 – 2018-10-09 (×10): 10 mg via ORAL
  Filled 2018-10-04 (×11): qty 1

## 2018-10-04 MED ORDER — NEPRO/CARBSTEADY PO LIQD
237.0000 mL | Freq: Two times a day (BID) | ORAL | Status: DC
  Administered 2018-10-05 – 2018-10-09 (×5): 237 mL via ORAL
  Filled 2018-10-04 (×11): qty 237

## 2018-10-04 MED ORDER — ALLOPURINOL 100 MG PO TABS
100.0000 mg | ORAL_TABLET | Freq: Every day | ORAL | Status: DC
  Administered 2018-10-05 – 2018-10-09 (×4): 100 mg via ORAL
  Filled 2018-10-04 (×5): qty 1

## 2018-10-04 MED ORDER — METHIMAZOLE 5 MG PO TABS
2.5000 mg | ORAL_TABLET | Freq: Every day | ORAL | Status: DC
  Administered 2018-10-04 – 2018-10-09 (×5): 2.5 mg via ORAL
  Filled 2018-10-04 (×6): qty 1

## 2018-10-04 MED ORDER — ACETAMINOPHEN 650 MG RE SUPP: 650.0000 mg | Freq: Four times a day (QID) | RECTAL | Status: DC | PRN

## 2018-10-04 MED ORDER — ASPIRIN EC 81 MG PO TBEC: 81.0000 mg | DELAYED_RELEASE_TABLET | Freq: Every day | ORAL | Status: DC

## 2018-10-04 MED ORDER — SODIUM CHLORIDE 0.9 % IV SOLN
2.0000 g | INTRAVENOUS | Status: DC
  Administered 2018-10-05 – 2018-10-07 (×4): 2 g via INTRAVENOUS
  Filled 2018-10-04 (×5): qty 20

## 2018-10-04 MED ORDER — CHLORHEXIDINE GLUCONATE CLOTH 2 % EX PADS: 6.0000 | MEDICATED_PAD | Freq: Every day | CUTANEOUS | Status: DC

## 2018-10-04 MED ORDER — COLESEVELAM HCL 625 MG PO TABS
1875.0000 mg | ORAL_TABLET | Freq: Two times a day (BID) | ORAL | Status: DC
  Administered 2018-10-04 – 2018-10-08 (×8): 1875 mg via ORAL
  Filled 2018-10-04 (×12): qty 3

## 2018-10-04 MED ORDER — CINACALCET HCL 30 MG PO TABS
90.0000 mg | ORAL_TABLET | Freq: Two times a day (BID) | ORAL | Status: DC
  Administered 2018-10-04 – 2018-10-08 (×9): 90 mg via ORAL
  Filled 2018-10-04 (×12): qty 3

## 2018-10-04 MED ORDER — HEPARIN SODIUM (PORCINE) 5000 UNIT/ML IJ SOLN
5000.0000 [IU] | Freq: Three times a day (TID) | INTRAMUSCULAR | Status: DC
  Administered 2018-10-04 – 2018-10-09 (×13): 5000 [IU] via SUBCUTANEOUS
  Filled 2018-10-04 (×14): qty 1

## 2018-10-04 MED ORDER — ASPIRIN 325 MG PO TABS
325.0000 mg | ORAL_TABLET | Freq: Every day | ORAL | Status: DC
  Administered 2018-10-04: 325 mg via ORAL
  Filled 2018-10-04: qty 1

## 2018-10-04 MED ORDER — IOPAMIDOL (ISOVUE-370) INJECTION 76%
INTRAVENOUS | Status: AC
  Administered 2018-10-04: 100 mL
  Filled 2018-10-04: qty 100

## 2018-10-04 MED ORDER — CITALOPRAM HYDROBROMIDE 10 MG PO TABS
10.0000 mg | ORAL_TABLET | Freq: Every day | ORAL | Status: DC
  Administered 2018-10-05 – 2018-10-08 (×5): 10 mg via ORAL
  Filled 2018-10-04 (×5): qty 1

## 2018-10-04 MED ORDER — SODIUM ZIRCONIUM CYCLOSILICATE 10 G PO PACK
10.0000 g | PACK | Freq: Once | ORAL | Status: DC
  Filled 2018-10-04: qty 1

## 2018-10-04 MED ORDER — PENTAFLUOROPROP-TETRAFLUOROETH EX AERO: 1.0000 "application " | INHALATION_SPRAY | CUTANEOUS | Status: DC | PRN

## 2018-10-04 MED ORDER — POLYETHYLENE GLYCOL 3350 17 G PO PACK
17.0000 g | PACK | Freq: Every day | ORAL | Status: DC
  Filled 2018-10-04 (×4): qty 1

## 2018-10-04 MED ORDER — RENA-VITE PO TABS
1.0000 | ORAL_TABLET | Freq: Every day | ORAL | Status: DC
  Administered 2018-10-05 – 2018-10-08 (×5): 1 via ORAL
  Filled 2018-10-04 (×6): qty 1

## 2018-10-04 MED ORDER — ONDANSETRON HCL 4 MG PO TABS
4.0000 mg | ORAL_TABLET | Freq: Four times a day (QID) | ORAL | Status: DC | PRN
  Administered 2018-10-04: 4 mg via ORAL
  Filled 2018-10-04: qty 1

## 2018-10-04 NOTE — Consult Note (Signed)
Cardiology Consultation:   Patient ID: [name] MRN: [id number]; DOB: 1951/07/28  Admit date: 10/03/2018 Date of Consult: 10/04/2018  Primary Care Provider: Biagio Borg, MD Primary Cardiologist: No primary care provider on file.  Primary Electrophysiologist:  Will Meredith Leeds, MD    Patient Profile:   [name] is a 68 y.o. male with a hx of ESRD on dialysis, CAD, hypothryoidism who is being seen today for the evaluation of elevated troponin, syncope and shortness of breath at the request of Dr. Hal Hope.  History of Present Illness:   [name] tells me that "I need someone to figure out what's wrong with me, I'm tired of coming back to the hospital." He has difficulty finding the main thing bothering him. He has felt fatigued and just overall "bad" for some time. He always feels cold, but no fevers or shaking chills. No chest pain on my discussion with him, though he notes some times when he presses on his chest it hurts sharply. No chest pain at rest or with deep breathing. He tells me he's had episodes of blacking out, including yesterday, but on further review it doesn't appear that he fully lost consciousness, more that he was weak and confused. Feels that his breathing is near his baseline. LE edema also at baseline.   Past Medical History:  Diagnosis Date  . Allergic rhinitis, cause unspecified 02/24/2014  . Anemia 06/16/2011  . BENIGN PROSTATIC HYPERTROPHY 10/14/2009  . CAD, NATIVE VESSEL 02/06/2009   a. 06/2007 s/p Taxus DES to the RCA;  b. 08/2016 NSTEMI in setting of SVT/PCI: LM 30ost, LAD 59m (3.0x16 Synergy DES), LCX 88m, OM1 60, RI 40, RCA 70p/m, 29m - not amenable to PCI.  [name] Cervical radiculopathy, chronic 02/23/2016   Right c5-6 by NCS/EMG  . Chronic combined systolic (congestive) and diastolic (congestive) heart failure ([place])    a. 10/2016 Echo: EF 40-45%, Gr2 DD. mildly dil LA.  [name] COLONIC POLYPS, HX OF 10/14/2009  . Dementia ([place]) [id number]  . Depression  09/24/2015  . DIABETES MELLITUS, TYPE II 02/01/2010  . DYSLIPIDEMIA 06/18/2007  . ESRD (end stage renal disease) on dialysis ([place]) 08/04/2010   ESRD due to DM adn HTN, started HD in 2011. As of 2019 has a R arm graft and gets HD on TTS sched  . FOOT PAIN 08/12/2008  . GAIT DISTURBANCE 03/03/2010  . GASTROENTERITIS, VIRAL 10/14/2009  . GERD 06/18/2007  . GOITER, MULTINODULAR 12/26/2007  . GOUT 06/18/2007  . GYNECOMASTIA 07/17/2010  . Hemodialysis access, fistula mature [facility])    Dialysis T-Th-Sa ([place]) Right upper arm fistula  . Hyperlipidemia 10/16/2011  . Hyperparathyroidism, secondary ([place]) 06/16/2011  . HYPERTENSION 06/18/2007  . Hyperthyroidism   . Hypocalcemia 06/07/2010  . Ischemic cardiomyopathy    a. 10/2016 Echo: EF 40-45%.  . Lumbar stenosis with neurogenic claudication   . ONYCHOMYCOSIS, TOENAILS 12/26/2007  . OSA on CPAP 10/16/2011  . Other malaise and fatigue 11/24/2009  . PERIPHERAL NEUROPATHY 06/18/2007  . Prostate cancer ([place])   . PSVT (paroxysmal supraventricular tachycardia) ([place])    a. [id number]/[id number] complicated by NSTEMI;  b. 11/2016 Treated w/ adenosine in ED;  c. 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  [name] PULMONARY NODULE, RIGHT LOWER LOBE 06/08/2009  . Sleep apnea    cpap machine and o2  . TRANSAMINASES, SERUM, ELEVATED 02/01/2010  . Transfusion history    none recent  . Unspecified hypotension 01/30/2010    Past Surgical History:  Procedure Laterality Date  .  A/V SHUNTOGRAM N/A 04/07/2018   Procedure: A/V SHUNTOGRAM;  Surgeon: Waynetta Sandy, MD;  Location: [place] CV LAB;  Service: Cardiovascular;  Laterality: N/A;  . ARTERIOVENOUS GRAFT PLACEMENT Right 2009   forearm/notes 02/01/2011  . AV FISTULA PLACEMENT  11/07/2011   Procedure: INSERTION OF ARTERIOVENOUS (AV) GORE-TEX GRAFT ARM;  Surgeon: Tinnie Gens, MD;  Location: [place];  Service: Vascular;  Laterality: Left;  . BACK SURGERY  1998  . BASCILIC VEIN TRANSPOSITION Right 02/27/2013   Procedure: BASCILIC  VEIN TRANSPOSITION;  Surgeon: Mal Misty, MD;  Location: [place];  Service: Vascular;  Laterality: Right;  Right Basilic Vein Transposition   . CARDIAC CATHETERIZATION N/A 08/06/2016   Procedure: Left Heart Cath and Coronary Angiography;  Surgeon: Jolaine Artist, MD;  Location: [place] CV LAB;  Service: Cardiovascular;  Laterality: N/A;  . CARDIAC CATHETERIZATION N/A 08/07/2016   Procedure: Coronary/Graft Atherectomy-CSI LAD;  Surgeon: Peter M Martinique, MD;  Location: [place] CV LAB;  Service: Cardiovascular;  Laterality: N/A;  . CERVICAL SPINE SURGERY  2/09   "to repair nerve problems in my left arm"  . CHOLECYSTECTOMY    . COLONOSCOPY WITH PROPOFOL N/A 04/26/2017   Procedure: COLONOSCOPY WITH PROPOFOL;  Surgeon: Otis Brace, MD;  Location: [place];  Service: Gastroenterology;  Laterality: N/A;  . CORONARY ANGIOPLASTY WITH STENT PLACEMENT  06/11/2008  . CORONARY ANGIOPLASTY WITH STENT PLACEMENT  06/2007   TAXUS stent to RCA/notes 01/31/2011  . ESOPHAGOGASTRODUODENOSCOPY  09/28/2011   Procedure: ESOPHAGOGASTRODUODENOSCOPY (EGD);  Surgeon: Missy Sabins, MD;  Location: [facility] ENDOSCOPY;  Service: Endoscopy;  Laterality: N/A;  . ESOPHAGOGASTRODUODENOSCOPY N/A 04/07/2015   Procedure: ESOPHAGOGASTRODUODENOSCOPY (EGD);  Surgeon: Teena Irani, MD;  Location: [facility];  Service: Endoscopy;  Laterality: N/A;  . ESOPHAGOGASTRODUODENOSCOPY N/A 04/19/2015   Procedure: ESOPHAGOGASTRODUODENOSCOPY (EGD);  Surgeon: Arta Silence, MD;  Location: [facility] ENDOSCOPY;  Service: Endoscopy;  Laterality: N/A;  . ESOPHAGOGASTRODUODENOSCOPY (EGD) WITH PROPOFOL N/A 06/13/2018   Procedure: ESOPHAGOGASTRODUODENOSCOPY (EGD) WITH PROPOFOL;  Surgeon: Wonda Horner, MD;  Location: [facility] ENDOSCOPY;  Service: Endoscopy;  Laterality: N/A;  . FLEXIBLE SIGMOIDOSCOPY N/A 05/21/2017   Procedure: Conni Elliot;  Surgeon: Clarene Essex, MD;  Location: [place];  Service: Endoscopy;  Laterality: N/A;  . FLEXIBLE  SIGMOIDOSCOPY Left 07/02/2017   Procedure: FLEXIBLE SIGMOIDOSCOPY;  Surgeon: Laurence Spates, MD;  Location: [place];  Service: Endoscopy;  Laterality: Left;  . FOREIGN BODY REMOVAL  09/2003   via upper endoscopy/notes 02/12/2011  . GIVENS CAPSULE STUDY  09/30/2011   Procedure: GIVENS CAPSULE STUDY;  Surgeon: Jeryl Columbia, MD;  Location: [facility] ENDOSCOPY;  Service: Endoscopy;  Laterality: N/A;  . INSERTION OF DIALYSIS CATHETER Right 2014  . INSERTION OF DIALYSIS CATHETER Left 02/11/2013   Procedure: INSERTION OF DIALYSIS CATHETER;  Surgeon: Conrad Shackelford, MD;  Location: [place];  Service: Vascular;  Laterality: Left;  Ultrasound guided  . LUMBAR LAMINECTOMY/DECOMPRESSION MICRODISCECTOMY Bilateral 07/31/2017   Procedure: LAMINECTOMY AND FORAMINOTOMY- BILATERAL LUMBAR TWO- LUMBAR THREE;  Surgeon: Earnie Larsson, MD;  Location: [place];  Service: Neurosurgery;  Laterality: Bilateral;  LAMINECTOMY AND FORAMINOTOMY- BILATERAL LUMBAR 2- LUMBAR 3  . PERIPHERAL VASCULAR BALLOON ANGIOPLASTY  04/07/2018   Procedure: PERIPHERAL VASCULAR BALLOON ANGIOPLASTY;  Surgeon: Waynetta Sandy, MD;  Location: [place] CV LAB;  Service: Cardiovascular;;  RUE AVF  . REMOVAL OF A DIALYSIS CATHETER Right 02/11/2013   Procedure: REMOVAL OF A DIALYSIS CATHETER;  Surgeon: Conrad Philadelphia, MD;  Location: [place];  Service: Vascular;  Laterality: Right;  . SAVORY DILATION N/A 04/07/2015   Procedure: SAVORY DILATION;  Surgeon: Teena Irani, MD;  Location: [facility] ENDOSCOPY;  Service: Endoscopy;  Laterality: N/A;  . SHUNTOGRAM N/A 09/20/2011   Procedure: Earney Mallet;  Surgeon: Conrad [place] Center, MD;  Location[facility] C[facility] LAB;  Service: Cardiovascular;  Laterality: N/A;  . SVT ABLATION N/A 11/26/2016   Procedure: SVT Ablation;  Surgeon: Will Meredith Leeds, MD;  Location: [place] CV LAB;  Service: Cardiovascular;  Laterality: N/A;  . TEE WITHOUT CARDIOVERSION N/A 08/25/2018   Procedure: TRANSESOPHAGEAL ECHOCARDIOGRAM (TEE);  Surgeon: Dorothy Spark, MD;  Location: [facility] ENDOSCOPY;  Service: Cardiovascular;  Laterality: N/A;  . TONSILLECTOMY    . TOTAL KNEE ARTHROPLASTY Right 08/02/2015   Procedure: TOTAL KNEE ARTHROPLASTY;  Surgeon: Renette Butters, MD;  Location: [place];  Service: Orthopedics;  Laterality: Right;  . VENOGRAM N/A 01/26/2013   Procedure: VENOGRAM;  Surgeon: Angelia Mould, MD;  Location: [facility] CATH LAB;  Service: Cardiovascular;  Laterality: N/A;     Home Medications:  Prior to Admission medications   Medication Sig Start Date End Date Taking? Authorizing Provider  acetaminophen (TYLENOL) 325 MG tablet Take 650 mg by mouth every 6 (six) hours as needed for mild pain or headache.    Yes [provider]  Alirocumab (PRALUENT) 150 MG/ML SOPN Inject 150 mg into the skin every 14 (fourteen) days. 03/05/18  Yes Lorretta Harp, MD  allopurinol (ZYLOPRIM) 100 MG tablet TAKE ONE TABLET BY MOUTH ONCE DAILY Patient taking differently: Take 100 mg by mouth daily.  10/14/17  Yes Biagio Borg, MD  aspirin EC 81 MG tablet Take 81 mg by mouth daily.   Yes [provider]  betamethasone dipropionate (DIPROLENE) 0.05 % cream Apply 1 application topically daily as needed (leg sores).   Yes [provider]  citalopram (CELEXA) 10 MG tablet Take 1 tablet (10 mg total) by mouth at bedtime. 10/14/17  Yes Biagio Borg, MD  colchicine 0.6 MG tablet Take 1 tablet (0.6 mg total) by mouth daily. 08/08/18  Yes Biagio Borg, MD  doxercalciferol ([name]) 4 MCG/2ML injection Inject 1 mL (2 mcg total) into the vein Every Tuesday,Thursday,and Saturday with dialysis. 09/02/18  Yes Hall, Archie Patten N, DO  ezetimibe (ZETIA) 10 MG tablet TAKE ONE TABLET BY MOUTH ONCE DAILY Patient taking differently: Take 10 mg by mouth daily.  04/08/17  Yes Bensimhon, Shaune Pascal, MD  ferric citrate (AURYXIA) 1 GM 210 MG(Fe) tablet Take 2 tablets (420 mg total) by mouth 3 (three) times daily with meals. 08/30/18  Yes Irene Pap N, DO    gabapentin (NEURONTIN) 100 MG capsule Take 1 capsule (100 mg total) by mouth at bedtime. 08/31/18  Yes Kayleen Memos, DO  hydroxypropyl methylcellulose / hypromellose (ISOPTO TEARS / GONIOVISC) 2.5 % ophthalmic solution Place 1 drop into both eyes 3 (three) times daily as needed for dry eyes. 05/03/17  Yes Biagio Borg, MD  methimazole (TAPAZOLE) 5 MG tablet Take 0.5 tablets (2.5 mg total) by mouth daily. 05/16/18  Yes Renato Shin, MD  multivitamin (RENA-VIT) TABS tablet Take 1 tablet by mouth daily. Reported on 10/24/2015   Yes [provider]  Nutritional Supplements (FEEDING SUPPLEMENT, NEPRO CARB STEADY,) LIQD Take 237 mLs by mouth 2 (two) times daily between meals. 08/30/18  Yes Hall, Carole N, DO  polyethylene glycol (MIRALAX / GLYCOLAX) packet Take 17 g by mouth daily. 05/23/17  Yes Florencia Reasons, MD  SENSIPAR 90 MG tablet  Take 90 mg by mouth 2 (two) times daily. 09/18/16  Yes [provider]  vancomycin (VANCOCIN) 125 MG capsule Take 125 mg by mouth every other day. Starting 09/19/18 take 1 tablet every other day for 8 days. Started 10/07/2018 take 1 tablet every three days for 2 weeks.   Yes [provider]  WELCHOL 625 MG tablet TAKE THREE TABLETS BY MOUTH TWICE DAILY WITH MEALS Patient taking differently: Take 1,875 mg by mouth 2 (two) times daily with a meal.  10/04/17  Yes Biagio Borg, MD  zolpidem (AMBIEN) 5 MG tablet Take 1 tablet (5 mg total) by mouth at bedtime as needed. for sleep Patient taking differently: Take 5 mg by mouth at bedtime as needed for sleep.  06/20/18  Yes Biagio Borg, MD    Inpatient Medications: Scheduled Meds: . allopurinol  100 mg Oral Daily  . [START ON 10/05/2018] Chlorhexidine Gluconate Cloth  6 each Topical Q0600  . cinacalcet  90 mg Oral BID WC  . citalopram  10 mg Oral QHS  . colchicine  0.6 mg Oral Daily  . colesevelam  1,875 mg Oral BID WC  . ezetimibe  10 mg Oral Daily  . feeding supplement (NEPRO CARB STEADY)  237 mL Oral BID  BM  . ferric citrate  420 mg Oral TID WC  . gabapentin  100 mg Oral QHS  . heparin  5,000 Units Subcutaneous Q8H  . insulin aspart  0-9 Units Subcutaneous TID WC  . methimazole  2.5 mg Oral Daily  . multivitamin  1 tablet Oral QHS  . polyethylene glycol  17 g Oral Daily  . predniSONE  10 mg Oral BID WC  . sodium zirconium cyclosilicate  10 g Oral Once  . vancomycin  125 mg Oral Q48H   Followed by  . [START ON 10/08/2018] vancomycin  125 mg Oral Q72H   Continuous Infusions:  PRN Meds: acetaminophen **OR** acetaminophen, ondansetron **OR** ondansetron (ZOFRAN) IV  Allergies:    Allergies  Allergen Reactions  . Cephalexin Swelling and Other (See Comments)    Tolerated Rocephin 10/19. Tongue swelling, but no breathing issues Tolerated Zosyn in Sept 2019 without any issues  . Statins Other (See Comments)    Weak muscles  . Ciprofloxacin Rash    Social History:   Social History   Socioeconomic History  . Marital status: Married    Spouse name: Not on file  . Number of children: 3  . Years of education: 44  . Highest education level: Not on file  Occupational History  . Occupation: disabled    Employer: DISABLED  . Occupation: formerly Lawyer for [employer].  Social Needs  . Financial resource strain: Not on file  . Food insecurity:    Worry: Not on file    Inability: Not on file  . Transportation needs:    Medical: Not on file    Non-medical: Not on file  Tobacco Use  . Smoking status: Former Smoker    Packs/day: 1.00    Years: 25.00    Pack years: 25.00    Types: Cigarettes    Last attempt to quit: 10/01/2005    Years since quitting: 13.0  . Smokeless tobacco: Former Systems developer    Quit date: 10/01/2005  . Tobacco comment: Quit smoking 2007 Smoked x 25 years 1/2 ppd.  Substance and Sexual Activity  . Alcohol use: No    Alcohol/week: 0.0 standard drinks  . Drug use: No  . Sexual activity: Never  Lifestyle  . Physical activity:    Days per week: 2 days     Minutes per session: 10 min  . Stress: Only a little  Relationships  . Social connections:    Talks on phone: Three times a week    Gets together: Not on file    Attends religious service: 1 to 4 times per year    Active member of club or organization: No    Attends meetings of clubs or organizations: Never    Relationship status: Married  . Intimate partner violence:    Fear of current or ex partner: Not on file    Emotionally abused: Not on file    Physically abused: Not on file    Forced sexual activity: Not on file  Other Topics Concern  . Not on file  Social History Narrative   Lives with wife   Caffeine use: Tea sometimes   No coffee   Left handed     Family History:    Family History  Problem Relation Age of Onset  . Heart disease Sister   . Thyroid nodules Sister   . Heart disease Father   . Diabetes Father   . Kidney failure Father   . Hypertension Father   . Healthy Child   . Healthy Child   . Healthy Child   . Cancer Neg Hx      ROS:  Please see the history of present illness.  Review of Systems  Constitutional: Positive for malaise/fatigue. Negative for chills and fever.  HENT: Negative for ear pain and nosebleeds.   Eyes: Negative for double vision and pain.  Respiratory: Negative for hemoptysis, sputum production and wheezing.   Cardiovascular: Positive for leg swelling. Negative for chest pain, palpitations, orthopnea, claudication and PND.  Gastrointestinal: Negative for abdominal pain, blood in stool, melena, nausea and vomiting.  Genitourinary: Negative for flank pain.  Musculoskeletal: Positive for joint pain. Negative for falls.  Neurological: Negative for focal weakness and loss of consciousness.   All other ROS reviewed and negative.     Physical Exam/Data:   Vitals:   10/04/18 1215 10/04/18 1245 10/04/18 1330 10/04/18 1500  BP: 120/65 (!) 143/87 110/75 (!) 155/81  Pulse: 82 77 74 71  Resp: 17 13 13 13   Temp:      TempSrc:        SpO2: 97% 97% 95% 95%  Weight:      Height:       No intake or output data in the 24 hours ending 10/04/18 1632 Filed Weights   10/03/18 2306  Weight: 98 kg   Body mass index is 28.5 kg/m.  General:  Well nourished, well developed, in no acute distress HEENT: normal Lymph: no adenopathy Neck: no JVD Endocrine:  No thryomegaly Vascular: No carotid bruits; FA pulses 2+ bilaterally without bruits  Cardiac:  normal S1, S2; RRR; no murmur. Able to reproduce his intermittent sharp MSK pain with palpation of sternum Lungs:  clear to auscultation bilaterally, no wheezing, rhonchi or rales  Abd: soft, nontender, no hepatomegaly  Ext: trace BL LE edema Musculoskeletal:  No deformities, BUE and BLE strength normal and equal Skin: warm and dry  Neuro:  no focal abnormalities noted Psych:  Normal affect   EKG:  The EKG was personally reviewed and demonstrates:  Sinus tachycardia, nonspecific st changes Telemetry:  Telemetry was personally reviewed and demonstrates:  Sinus tachycardia  Relevant CV Studies: Echo 08/13/18 - Left ventricle: The cavity size was mildly dilated.  Systolic   function was mildly to moderately reduced. The estimated ejection   fraction was in the range of 40% to 45%. Diffuse hypokinesis.   Although no diagnostic regional wall motion abnormality was   identified, this possibility cannot be completely excluded on the   basis of this study. The study is indeterminate for the   evaluation of LV diastolic function. - Left atrium: The atrium was mildly dilated. - Right ventricle: Systolic function was normal.  Impressions:  - Reduced LV EF, estimated at 40-45% with visual, biplane, and   speckle tracking estimates. Reduced GLS.  Laboratory Data:  Chemistry Recent Labs  Lab 10/03/18 2343 10/04/18 0520  NA 140 138  K 5.0 6.1*  CL 100 98  CO2 25 26  GLUCOSE 190* 176*  BUN 43* 47*  CREATININE 9.70* 10.43*  CALCIUM 9.1 9.3  GFRNONAA 5* 4*  GFRAA 6* 5*   ANIONGAP 15 14    Recent Labs  Lab 10/03/18 2343  PROT 7.4  ALBUMIN 3.1*  AST 471*  ALT 439*  ALKPHOS 235*  BILITOT 2.1*   Hematology Recent Labs  Lab 10/03/18 2342 10/04/18 0520  WBC 11.8* 16.7*  RBC 4.34 4.16*  HGB 12.4* 11.9*  HCT 41.5 39.5  MCV 95.6 95.0  MCH 28.6 28.6  MCHC 29.9* 30.1  RDW 17.9* 18.1*  PLT 263 285   Cardiac Enzymes Recent Labs  Lab 10/04/18 0520 10/04/18 1105  TROPONINI 0.04* 0.04*    Recent Labs  Lab 10/03/18 2332  TROPIPOC 0.03    BNPNo results for input(s): BNP, PROBNP in the last 168 hours.  DDimer  Recent Labs  Lab 10/04/18 0157  DDIMER 4.52*    Radiology/Studies:  Dg Chest 2 View  Result Date: 10/03/2018 CLINICAL DATA:  Altered mental status EXAM: CHEST - 2 VIEW COMPARISON:  08/23/2018 FINDINGS: Mild cardiomegaly. No acute consolidation or effusion. No pneumothorax. Postsurgical changes in the cervical spine. IMPRESSION: No active cardiopulmonary disease.  Cardiomegaly. Electronically Signed   By: Donavan Foil M.D.   On: 10/03/2018 23:59   Ct Head Wo Contrast  Result Date: 10/04/2018 CLINICAL DATA:  Altered mental status. EXAM: CT HEAD WITHOUT CONTRAST TECHNIQUE: Contiguous axial images were obtained from the base of the skull through the vertex without intravenous contrast. COMPARISON:  CT 08/19/2018 FINDINGS: Brain: Brain volume is normal for age. Mild chronic small vessel ischemia, unchanged from prior. No intracranial hemorrhage, mass effect, or midline shift. No hydrocephalus. The basilar cisterns are patent. No evidence of territorial infarct or acute ischemia. No extra-axial or intracranial fluid collection. Vascular: Atherosclerosis of skullbase vasculature without hyperdense vessel or abnormal calcification. Skull: No fracture or focal lesion. Sinuses/Orbits: No acute finding. Trace mucosal thickening in the sphenoid sinus, unchanged. Other: None. IMPRESSION: No acute intracranial abnormality. Electronically Signed   By:  Keith Rake M.D.   On: 10/04/2018 02:39   Ct Angio Chest Pe W Or Wo Contrast  Result Date: 10/04/2018 CLINICAL DATA:  Hypoxia. Altered mental status. Dialysis patient. EXAM: CT ANGIOGRAPHY CHEST WITH CONTRAST TECHNIQUE: Multidetector CT imaging of the chest was performed using the standard protocol during bolus administration of intravenous contrast. Multiplanar CT image reconstructions and MIPs were obtained to evaluate the vascular anatomy. CONTRAST:  169mL ISOVUE-370 IOPAMIDOL (ISOVUE-370) INJECTION 76% COMPARISON:  Chest radiography 10/03/2018 FINDINGS: Cardiovascular: Pulmonary arterial opacification is good. There are no pulmonary emboli. The heart is enlarged. There is extensive coronary artery calcification. There is aortic atherosclerosis without aneurysm or dissection. Mediastinum/Nodes: No mass or lymphadenopathy. Lungs/Pleura:  No pleural effusion. Mild dependent pulmonary atelectasis. No infiltrate or mass. Upper Abdomen: Air in the biliary tree as seen previously. No acute upper abdominal finding. Musculoskeletal: Ordinary thoracic spondylosis, most pronounced at T11-12. Review of the MIP images confirms the above findings. IMPRESSION: 1. No pulmonary emboli or other acute chest pathology. 2. Air in the biliary tree as seen previously. Aortic Atherosclerosis (ICD10-I70.0). Electronically Signed   By: Nelson Chimes M.D.   On: 10/04/2018 07:29   Mr Brain Wo Contrast  Result Date: 10/04/2018 CLINICAL DATA:  Initial evaluation for acute weakness, confusion, altered mental status. EXAM: MRI HEAD WITHOUT CONTRAST TECHNIQUE: Multiplanar, multiecho pulse sequences of the brain and surrounding structures were obtained without intravenous contrast. COMPARISON:  Prior CT from earlier the same day. FINDINGS: Brain: Mild diffuse prominence of the CSF containing spaces compatible with generalized age-related cerebral atrophy. Patchy and confluent T2/FLAIR hyperintensity within the periventricular and deep  white matter both cerebral hemispheres most compatible with chronic microvascular ischemic change, moderate nature. Chronic microvascular ischemic disease noted within the pons as well. Subcentimeter remote right cerebellar infarct noted. 6 mm focus of diffusion abnormality within the right cerebellar hemisphere consistent with a small acute ischemic infarct (series 5, image 57). No associated hemorrhage or mass effect. No other evidence for acute or subacute ischemia. Gray-white matter differentiation otherwise maintained. No evidence for acute or chronic intracranial hemorrhage. No mass lesion, midline shift or mass effect. No hydrocephalus. No extra-axial fluid collection. Pituitary gland within normal limits. Vascular: Major intracranial vascular flow voids are maintained. Skull and upper cervical spine: Craniocervical junction within normal limits. Postsurgical changes noted within the upper cervical spine, partially visualized. No focal marrow replacing lesion. Scalp soft tissues unremarkable. Sinuses/Orbits: Globes and orbital soft tissues within normal limits. Mild chronic mucosal thickening seen throughout the paranasal sinuses. No air-fluid levels to suggest acute sinusitis. Trace opacity bilateral mastoid air cells, of doubtful significance. Inner ear structures grossly normal. Other: None. IMPRESSION: 1. 6 mm acute ischemic nonhemorrhagic right cerebellar infarct. 2. Underlying age-related cerebral atrophy with moderate chronic small vessel ischemic disease, with additional subcentimeter chronic right cerebellar infarct. Electronically Signed   By: Jeannine Boga M.D.   On: 10/04/2018 06:22   Mr Jodene Nam Head Wo Contrast  Result Date: 10/04/2018 CLINICAL DATA:  Follow-up acute stroke. 6 mm right cerebellar infarction. EXAM: MRA HEAD WITHOUT CONTRAST TECHNIQUE: Angiographic images of the Circle of Willis were obtained using MRA technique without intravenous contrast. COMPARISON:  MRI same day.  FINDINGS: Both internal carotid arteries are patent through the skull base and siphon regions. There is atherosclerotic irregularity in the carotid siphon regions but no flow limiting stenosis. The anterior and middle cerebral vessels are patent without proximal stenosis, aneurysm or vascular malformation. More distal branch vessels show some atherosclerotic irregularity. Both vertebral arteries are patent to the basilar. No basilar stenosis. Posterior circulation branch vessels are patent. Posterior cerebral artery in superior cerebellar artery branches distally show some atherosclerotic irregularity. IMPRESSION: No large or medium vessel occlusion or correctable proximal stenosis. Distal branch vessel atherosclerotic narrowing and irregularity seen throughout, more prominent within the superior cerebellar and posterior cerebral branches. Electronically Signed   By: Nelson Chimes M.D.   On: 10/04/2018 10:22   Ct Renal Stone Study  Result Date: 10/04/2018 CLINICAL DATA:  Right flank pain EXAM: CT ABDOMEN AND PELVIS WITHOUT CONTRAST TECHNIQUE: Multidetector CT imaging of the abdomen and pelvis was performed following the standard protocol without IV contrast. COMPARISON:  08/20/2018 FINDINGS: Lower chest: 4 mm  right lower lobe pulmonary nodule on image 7 is stable dating back to 09/27/2011. Hepatobiliary: Postcholecystectomy and pneumobilia are stable. No obvious liver mass. Pancreas: Unremarkable Spleen: Unremarkable Adrenals/Urinary Tract: Adrenal glands are within normal limits. There is advanced atrophy of both kidneys. Several low-density lesions are scattered throughout both kidneys. They are not significantly changed. There is no hydronephrosis. Calcifications within the central kidneys are likely vascular. These are also stable. No definite ureteral calculus or hydroureter. Bladder is decompressed. Stomach/Bowel: Normal appendix. There is no obvious mass in the visualized colon. No disproportionate  dilatation of small bowel to suggest obstruction. Nonspecific air-fluid levels in small bowel are noted. Stomach is decompressed. Vascular/Lymphatic: Atherosclerotic calcifications of the aorta and iliac arteries are present. Maximal diameter of the abdominal aorta is 2.6 cm. Reproductive: There surgical clips in the prostate gland. Other: No free fluid. Musculoskeletal: Spinal fusion hardware is in place. There is solid fusion across the L4-5 disc space. Ray cage is are in place at the L3-4 disc space. There is some bony bridging across the posterior elements at this level. No vertebral compression. IMPRESSION: No evidence of urinary calculus or urinary obstruction. Stable exam. Electronically Signed   By: Marybelle Killings M.D.   On: 10/04/2018 08:57   Vas US Carotid (at [facility] Only)  Result Date: 10/04/2018 Carotid Arterial Duplex Study Indications: CVA. Performing Technologist: June Leap RDMS, RVT  Examination Guidelines: A complete evaluation includes B-mode imaging, spectral Doppler, color Doppler, and power Doppler as needed of all accessible portions of each vessel. Bilateral testing is considered an integral part of a complete examination. Limited examinations for reoccurring indications may be performed as noted.  Right Carotid Findings: +----------+--------+--------+--------+----------+--------+           PSV cm/sEDV cm/sStenosisDescribe  Comments +----------+--------+--------+--------+----------+--------+ CCA Prox  54      11              hypoechoic         +----------+--------+--------+--------+----------+--------+ CCA Distal60      11                                 +----------+--------+--------+--------+----------+--------+ ICA Prox  60      16      1-39%   calcific           +----------+--------+--------+--------+----------+--------+ ICA Distal130     36                                 +----------+--------+--------+--------+----------+--------+ ECA       126      5                                  +----------+--------+--------+--------+----------+--------+ +----------+--------+-------+----------------+-------------------+           PSV cm/sEDV cmsDescribe        Arm Pressure (mmHG) +----------+--------+-------+----------------+-------------------+ [id number]            Multiphasic, WNL                    +----------+--------+-------+----------------+-------------------+ +---------+--------+--+--------+--+---------+ VertebralPSV cm/s60EDV cm/s13Antegrade +---------+--------+--+--------+--+---------+  Left Carotid Findings: +----------+--------+--------+--------+------------+--------+           PSV cm/sEDV cm/sStenosisDescribe    Comments +----------+--------+--------+--------+------------+--------+ CCA Prox  90      20  heterogenous         +----------+--------+--------+--------+------------+--------+ CCA Distal64      12                                   +----------+--------+--------+--------+------------+--------+ ICA Prox  55      17      1-39%   heterogenous         +----------+--------+--------+--------+------------+--------+ ICA Distal114     27                                   +----------+--------+--------+--------+------------+--------+ ECA       82      5                                    +----------+--------+--------+--------+------------+--------+ +----------+--------+--------+----------------+-------------------+ SubclavianPSV cm/sEDV cm/sDescribe        Arm Pressure (mmHG) +----------+--------+--------+----------------+-------------------+           120             Multiphasic, WNL                    +----------+--------+--------+----------------+-------------------+ +---------+--------+--+--------+--+---------+ VertebralPSV cm/s56EDV cm/s19Antegrade +---------+--------+--+--------+--+---------+  Summary: Right Carotid: Velocities in the right ICA are consistent  with a 1-39% stenosis. Left Carotid: Velocities in the left ICA are consistent with a 1-39% stenosis.  *See table(s) above for measurements and observations.     Preliminary    US Abdomen Limited Ruq  Result Date: 10/04/2018 CLINICAL DATA:  History of hepatitis. EXAM: ULTRASOUND ABDOMEN LIMITED RIGHT UPPER QUADRANT COMPARISON:  CT abdomen and pelvis earlier today. FINDINGS: Gallbladder: Removed. Common bile duct: Diameter: 0.5 cm Liver: Pneumobilia and mild intrahepatic biliary ductal prominence in the left hepatic lobe as seen on CT earlier today noted. No focal lesion. Portal vein is patent on color Doppler imaging with normal direction of blood flow towards the liver. IMPRESSION: No acute or focal abnormality. Status post cholecystectomy. Pneumobilia and mild intrahepatic biliary ductal prominence in the left hepatic lobe as seen on CT earlier today. Electronically Signed   By: Inge Rise M.D.   On: 10/04/2018 16:15    Assessment and Plan:   1. Chest pain: denied specific pain/pressure to me, on questioning endorsed only tenderness to palpation intermittently over sternum, which was reproducible on exam. Troponins are flat and lower than prior values, consistent with ESRD. Team has already ordered repeat echocardiogram. No indication for further ischemic evaluation at this time.  2. Altered mental status: clear on my exam. Neurology following, admitted for stroke work up. Defer further workup to primary team  [facility] will sign off.  Please call us if clinical situation changes. Medication Recommendations:  No changes Other recommendations (labs, testing, etc):  none Follow up as an outpatient:  As previously scheduled  For questions or updates, please contact [place] Please consult [email or web address] for contact info under     Signed, Buford Dresser, MD  10/04/2018 4:32 PM

## 2018-10-04 NOTE — ED Notes (Signed)
Patient transported to CT 

## 2018-10-04 NOTE — Progress Notes (Signed)
HD tx initiated via 15Gx2 w/o problem Pull/push/flush well w/o problem VSS Will continue to monitor while on HD tx 

## 2018-10-04 NOTE — Progress Notes (Signed)
  PROGRESS NOTE  Patient admitted earlier this morning. See H&P. [name] is a 68 y.o. male with ESRD on hemodialysis on Tuesday Thursday Saturday, CAD, hypothyroidism, gout, recently admitted for sepsis and has had multiple episodes of bacteremia and also recently treated for C. difficile still on vancomycin was not feeling well for last couple of days feeling tired and weak.  Last evening patient found it difficult to walk and had to rest on his bed following which patient wife found that patient was very confused and had a bowel movement.  After that patient was difficult to make him walk and EMS was called.  Patient remained confused at least for an hour or 2.  Prior to which patient also had some chest pressure.  Patient wife states that patient has had left wrist gout attack and has started back on prednisone yesterday which patient took first dose.   Right cerebellar infarct Neurology consulted MRA head, Carotid Doppler, Echocardiogram pending PT OT SLP  Chest pain Troponin flat trend 0.04  CTA chest negative for PE Echo pending  Cardiology consulted  Elevated LFT Acute hepatitis panel pending Right upper quadrant ultrasound pending  C. difficile Continue vancomycin, this was diagnosed prior to this admission  Gout of left wrist Continue prednisone, allopurinol   Diabetes mellitus type 2 Sliding-scale insulin  ESRD on dialysis TTS Nephrology consulted  Hyperkalemia Lokelma ordered  Depression Continue Celexa  Hyperthyroidism Continue methimazole  OSA CPAP nightly  Dessa Phi, DO [facility] [email or web address] Password [email or web address] 10/04/2018, 1:51 PM

## 2018-10-04 NOTE — Consult Note (Addendum)
NEURO HOSPITALIST  CONSULT   Requesting Physician: Dr. Roxanne Mins    Chief Complaint: AMS  History obtained from:  Patient     HPI:                                                                                                                                         [name] is an 68 y.o. male  With PMH peripheral neuropathy, OSA ( CPAP at night), HTN,hyperthyroidism, CAD,, combine systolic and diastolic HF, DM 2, ESRD ( on hemodialysis) who presented to [facility] ed with c/o AMS. Neurology consulted for stroke seen on MRI.  Per chart: patient was just feeling generally bad. He can not pinpoint what is bad. Family member stated that he was weak and unable to stand and some confusion was noted. He complained of CP on the night of 1/2 and day of 1/3. EMS was called. EMS noted O2 sat in 60's  that improved with supplemental oxygen. Patient was not complaining of dyspnea. Patient states that his left side has been weaker than his right for a year or so. Every since he had an operation performed by Dr. Trenton Gammon. Patient also states that he has been generally weak and feeling bad for 3-4 months.  Denies SOB, fevers, chills, vision changes. No prior stroke history noted. Of note patient was admitted 08/19/18 for sepsis and was discharged 08-31-18  ED course:  BG: 138 BP:  Potassium: 6.1 BUN: 47 creatinine 10.43 GFR <5. CTH: no hemorhage MRI: 6 mm acute ischemic right cerebellar infarct.   Date last known well: Unable to determine Time last known well: Unable to determine tPA Given: No: outside of window  Modified Rankin: Rankin Score=1 NIHSS:0   Past Medical History:  Diagnosis Date  . Allergic rhinitis, cause unspecified 02/24/2014  . Anemia 06/16/2011  . BENIGN PROSTATIC HYPERTROPHY 10/14/2009  . CAD, NATIVE VESSEL 02/06/2009   a. 06/2007 s/p Taxus DES to the RCA;  b. 08/2016 NSTEMI in setting of SVT/PCI: LM 30ost, LAD 63m (3.0x16 Synergy DES), LCX 94m,  OM1 60, RI 40, RCA 70p/m, 81m - not amenable to PCI.  [name] Cervical radiculopathy, chronic 02/23/2016   Right c5-6 by NCS/EMG  . Chronic combined systolic (congestive) and diastolic (congestive) heart failure ([place])    a. 10/2016 Echo: EF 40-45%, Gr2 DD. mildly dil LA.  [name] COLONIC POLYPS, HX OF 10/14/2009  . Dementia ([place]) [id number]  . Depression 09/24/2015  . DIABETES MELLITUS, TYPE II 02/01/2010  . DYSLIPIDEMIA 06/18/2007  . ESRD (end stage renal disease) on dialysis ([place]) 08/04/2010   ESRD due to DM adn HTN, started HD in 2011.  As of 2019 has a R arm graft and gets HD on TTS sched  . FOOT PAIN 08/12/2008  . GAIT DISTURBANCE 03/03/2010  . GASTROENTERITIS, VIRAL 10/14/2009  . GERD 06/18/2007  . GOITER, MULTINODULAR 12/26/2007  . GOUT 06/18/2007  . GYNECOMASTIA 07/17/2010  . Hemodialysis access, fistula mature [name])    Dialysis T-Th-Sa ([place]) Right upper arm fistula  . Hyperlipidemia 10/16/2011  . Hyperparathyroidism, secondary ([place]) 06/16/2011  . HYPERTENSION 06/18/2007  . Hyperthyroidism   . Hypocalcemia 06/07/2010  . Ischemic cardiomyopathy    a. 10/2016 Echo: EF 40-45%.  . Lumbar stenosis with neurogenic claudication   . ONYCHOMYCOSIS, TOENAILS 12/26/2007  . OSA on CPAP 10/16/2011  . Other malaise and fatigue 11/24/2009  . PERIPHERAL NEUROPATHY 06/18/2007  . Prostate cancer ([place])   . PSVT (paroxysmal supraventricular tachycardia) ([place])    a. 56/3875 complicated by NSTEMI;  b. 11/2016 Treated w/ adenosine in ED;  c. 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  [name] PULMONARY NODULE, RIGHT LOWER LOBE 06/08/2009  . Sleep apnea    cpap machine and o2  . TRANSAMINASES, SERUM, ELEVATED 02/01/2010  . Transfusion history    none recent  . Unspecified hypotension 01/30/2010    Past Surgical History:  Procedure Laterality Date  . A/V SHUNTOGRAM N/A 04/07/2018   Procedure: A/V SHUNTOGRAM;  Surgeon: Waynetta Sandy, MD;  Location: [place] CV LAB;  Service: Cardiovascular;  Laterality: N/A;  .  ARTERIOVENOUS GRAFT PLACEMENT Right 2009   forearm/notes 02/01/2011  . AV FISTULA PLACEMENT  11/07/2011   Procedure: INSERTION OF ARTERIOVENOUS (AV) GORE-TEX GRAFT ARM;  Surgeon: Tinnie Gens, MD;  Location: [place];  Service: Vascular;  Laterality: Left;  . BACK SURGERY  1998  . BASCILIC VEIN TRANSPOSITION Right 02/27/2013   Procedure: BASCILIC VEIN TRANSPOSITION;  Surgeon: Mal Misty, MD;  Location: [place];  Service: Vascular;  Laterality: Right;  Right Basilic Vein Transposition   . CARDIAC CATHETERIZATION N/A 08/06/2016   Procedure: Left Heart Cath and Coronary Angiography;  Surgeon: Jolaine Artist, MD;  Location: [place] CV LAB;  Service: Cardiovascular;  Laterality: N/A;  . CARDIAC CATHETERIZATION N/A 08/07/2016   Procedure: Coronary/Graft Atherectomy-CSI LAD;  Surgeon: Peter M Martinique, MD;  Location: [place] CV LAB;  Service: Cardiovascular;  Laterality: N/A;  . CERVICAL SPINE SURGERY  2/09   "to repair nerve problems in my left arm"  . CHOLECYSTECTOMY    . COLONOSCOPY WITH PROPOFOL N/A 04/26/2017   Procedure: COLONOSCOPY WITH PROPOFOL;  Surgeon: Otis Brace, MD;  Location: [place];  Service: Gastroenterology;  Laterality: N/A;  . CORONARY ANGIOPLASTY WITH STENT PLACEMENT  06/11/2008  . CORONARY ANGIOPLASTY WITH STENT PLACEMENT  06/2007   TAXUS stent to RCA/notes 01/31/2011  . ESOPHAGOGASTRODUODENOSCOPY  09/28/2011   Procedure: ESOPHAGOGASTRODUODENOSCOPY (EGD);  Surgeon: Missy Sabins, MD;  Location: [facility] ENDOSCOPY;  Service: Endoscopy;  Laterality: N/A;  . ESOPHAGOGASTRODUODENOSCOPY N/A 04/07/2015   Procedure: ESOPHAGOGASTRODUODENOSCOPY (EGD);  Surgeon: Teena Irani, MD;  Location: [facility];  Service: Endoscopy;  Laterality: N/A;  . ESOPHAGOGASTRODUODENOSCOPY N/A 04/19/2015   Procedure: ESOPHAGOGASTRODUODENOSCOPY (EGD);  Surgeon: Arta Silence, MD;  Location: [facility] ENDOSCOPY;  Service: Endoscopy;  Laterality: N/A;  . ESOPHAGOGASTRODUODENOSCOPY (EGD) WITH PROPOFOL N/A 06/13/2018    Procedure: ESOPHAGOGASTRODUODENOSCOPY (EGD) WITH PROPOFOL;  Surgeon: Wonda Horner, MD;  Location: [facility] ENDOSCOPY;  Service: Endoscopy;  Laterality: N/A;  . FLEXIBLE SIGMOIDOSCOPY N/A 05/21/2017   Procedure: Conni Elliot;  Surgeon: Clarene Essex, MD;  Location: [place];  Service: Endoscopy;  Laterality: N/A;  . FLEXIBLE SIGMOIDOSCOPY Left 07/02/2017   Procedure: FLEXIBLE SIGMOIDOSCOPY;  Surgeon: Laurence Spates, MD;  Location: [place];  Service: Endoscopy;  Laterality: Left;  . FOREIGN BODY REMOVAL  09/2003   via upper endoscopy/notes 02/12/2011  . GIVENS CAPSULE STUDY  09/30/2011   Procedure: GIVENS CAPSULE STUDY;  Surgeon: Jeryl Columbia, MD;  Location: [facility] ENDOSCOPY;  Service: Endoscopy;  Laterality: N/A;  . INSERTION OF DIALYSIS CATHETER Right 2014  . INSERTION OF DIALYSIS CATHETER Left 02/11/2013   Procedure: INSERTION OF DIALYSIS CATHETER;  Surgeon: Conrad Fairfield, MD;  Location: [place];  Service: Vascular;  Laterality: Left;  Ultrasound guided  . LUMBAR LAMINECTOMY/DECOMPRESSION MICRODISCECTOMY Bilateral 07/31/2017   Procedure: LAMINECTOMY AND FORAMINOTOMY- BILATERAL LUMBAR TWO- LUMBAR THREE;  Surgeon: Earnie Larsson, MD;  Location: [place];  Service: Neurosurgery;  Laterality: Bilateral;  LAMINECTOMY AND FORAMINOTOMY- BILATERAL LUMBAR 2- LUMBAR 3  . PERIPHERAL VASCULAR BALLOON ANGIOPLASTY  04/07/2018   Procedure: PERIPHERAL VASCULAR BALLOON ANGIOPLASTY;  Surgeon: Waynetta Sandy, MD;  Location: [place] CV LAB;  Service: Cardiovascular;;  RUE AVF  . REMOVAL OF A DIALYSIS CATHETER Right 02/11/2013   Procedure: REMOVAL OF A DIALYSIS CATHETER;  Surgeon: Conrad Rayland, MD;  Location: [place];  Service: Vascular;  Laterality: Right;  . SAVORY DILATION N/A 04/07/2015   Procedure: SAVORY DILATION;  Surgeon: Teena Irani, MD;  Location: [facility] ENDOSCOPY;  Service: Endoscopy;  Laterality: N/A;  . SHUNTOGRAM N/A 09/20/2011   Procedure: Earney Mallet;  Surgeon: Conrad Silvana, MD;  Location: [facility]  CATH LAB;  Service: Cardiovascular;  Laterality: N/A;  . SVT ABLATION N/A 11/26/2016   Procedure: SVT Ablation;  Surgeon: Will Meredith Leeds, MD;  Location: [place] CV LAB;  Service: Cardiovascular;  Laterality: N/A;  . TEE WITHOUT CARDIOVERSION N/A 08/25/2018   Procedure: TRANSESOPHAGEAL ECHOCARDIOGRAM (TEE);  Surgeon: Dorothy Spark, MD;  Location: [facility] ENDOSCOPY;  Service: Cardiovascular;  Laterality: N/A;  . TONSILLECTOMY    . TOTAL KNEE ARTHROPLASTY Right 08/02/2015   Procedure: TOTAL KNEE ARTHROPLASTY;  Surgeon: Renette Butters, MD;  Location: [place];  Service: Orthopedics;  Laterality: Right;  . VENOGRAM N/A 01/26/2013   Procedure: VENOGRAM;  Surgeon: Angelia Mould, MD;  Location: [facility] CATH LAB;  Service: Cardiovascular;  Laterality: N/A;    Family History  Problem Relation Age of Onset  . Heart disease Sister   . Thyroid nodules Sister   . Heart disease Father   . Diabetes Father   . Kidney failure Father   . Hypertension Father   . Healthy Child   . Healthy Child   . Healthy Child   . Cancer Neg Hx         Social History:  reports that he quit smoking about 13 years ago. His smoking use included cigarettes. He has a 25.00 pack-year smoking history. He quit smokeless tobacco use about 13 years ago. He reports that he does not drink alcohol or use drugs.  Allergies:  Allergies  Allergen Reactions  . Cephalexin Swelling and Other (See Comments)    Tolerated Rocephin 10/19. Tongue swelling, but no breathing issues Tolerated Zosyn in Sept 2019 without any issues  . Statins Other (See Comments)    Weak muscles  . Ciprofloxacin Rash    Medications:  Current Facility-Administered Medications  Medication Dose Route Frequency Provider Last Rate Last Dose  . acetaminophen (TYLENOL) tablet 650 mg  650 mg Oral Q6H PRN Rise Patience, MD        Or  . acetaminophen (TYLENOL) suppository 650 mg  650 mg Rectal Q6H PRN Rise Patience, MD      . allopurinol (ZYLOPRIM) tablet 100 mg  100 mg Oral Daily Rise Patience, MD      . aspirin suppository 300 mg  300 mg Rectal Daily Rise Patience, MD       Or  . aspirin tablet 325 mg  325 mg Oral Daily Rise Patience, MD      . cinacalcet (SENSIPAR) tablet 90 mg  90 mg Oral BID WC Rise Patience, MD      . citalopram (CELEXA) tablet 10 mg  10 mg Oral QHS Rise Patience, MD      . colchicine tablet 0.6 mg  0.6 mg Oral Daily Rise Patience, MD      . colesevelam [facility]) tablet 1,875 mg  1,875 mg Oral BID WC Rise Patience, MD      . ezetimibe (ZETIA) tablet 10 mg  10 mg Oral Daily Rise Patience, MD      . feeding supplement (NEPRO CARB STEADY) liquid 237 mL  237 mL Oral BID BM Rise Patience, MD      . ferric citrate (AURYXIA) tablet 420 mg  420 mg Oral TID WC Rise Patience, MD      . gabapentin (NEURONTIN) capsule 100 mg  100 mg Oral QHS Rise Patience, MD      . heparin injection 5,000 Units  5,000 Units Subcutaneous Q8H Rise Patience, MD   5,000 Units at 10/04/18 [phone]  . insulin aspart (novoLOG) injection 0-9 Units  0-9 Units Subcutaneous TID WC Rise Patience, MD      . methimazole (TAPAZOLE) tablet 2.5 mg  2.5 mg Oral Daily Rise Patience, MD      . multivitamin (RENA-VIT) tablet 1 tablet  1 tablet Oral QHS Rise Patience, MD      . ondansetron [facility]) tablet 4 mg  4 mg Oral Q6H PRN Rise Patience, MD   4 mg at 10/04/18 0751   Or  . ondansetron (ZOFRAN) injection 4 mg  4 mg Intravenous Q6H PRN Rise Patience, MD      . polyethylene glycol (MIRALAX / GLYCOLAX) packet 17 g  17 g Oral Daily Rise Patience, MD      . predniSONE (DELTASONE) tablet 10 mg  10 mg Oral BID WC Rise Patience, MD   10 mg at 10/04/18 0751  . vancomycin (VANCOCIN) 50 mg/mL oral solution 125 mg  125  mg Oral Q48H Dessa Phi, DO       Followed by  . [START ON 10/08/2018] vancomycin (VANCOCIN) 50 mg/mL oral solution 125 mg  125 mg Oral Q72H Dessa Phi, DO       Current Outpatient Medications  Medication Sig Dispense Refill  . acetaminophen (TYLENOL) 325 MG tablet Take 650 mg by mouth every 6 (six) hours as needed for mild pain or headache.     . Alirocumab (PRALUENT) 150 MG/ML SOPN Inject 150 mg into the skin every 14 (fourteen) days. 2 pen 12  . allopurinol (ZYLOPRIM) 100 MG tablet TAKE ONE TABLET BY MOUTH ONCE DAILY (Patient taking differently: Take 100 mg by mouth daily. ) 90 tablet  3  . aspirin EC 81 MG tablet Take 81 mg by mouth daily.    . betamethasone dipropionate (DIPROLENE) 0.05 % cream Apply 1 application topically daily as needed (leg sores).    . citalopram (CELEXA) 10 MG tablet Take 1 tablet (10 mg total) by mouth at bedtime. 90 tablet 3  . colchicine 0.6 MG tablet Take 1 tablet (0.6 mg total) by mouth daily. 90 tablet 3  . doxercalciferol ([name]) 4 MCG/2ML injection Inject 1 mL (2 mcg total) into the vein Every Tuesday,Thursday,and Saturday with dialysis. 2 mL 0  . ezetimibe (ZETIA) 10 MG tablet TAKE ONE TABLET BY MOUTH ONCE DAILY (Patient taking differently: Take 10 mg by mouth daily. ) 90 tablet 3  . ferric citrate (AURYXIA) 1 GM 210 MG(Fe) tablet Take 2 tablets (420 mg total) by mouth 3 (three) times daily with meals. 60 tablet 0  . gabapentin (NEURONTIN) 100 MG capsule Take 1 capsule (100 mg total) by mouth at bedtime. 30 capsule 0  . hydroxypropyl methylcellulose / hypromellose (ISOPTO TEARS / GONIOVISC) 2.5 % ophthalmic solution Place 1 drop into both eyes 3 (three) times daily as needed for dry eyes. 15 mL 11  . methimazole (TAPAZOLE) 5 MG tablet Take 0.5 tablets (2.5 mg total) by mouth daily. 45 tablet 3  . multivitamin (RENA-VIT) TABS tablet Take 1 tablet by mouth daily. Reported on 10/24/2015    . Nutritional Supplements (FEEDING SUPPLEMENT, NEPRO CARB STEADY,)  LIQD Take 237 mLs by mouth 2 (two) times daily between meals. 7 Can 0  . polyethylene glycol (MIRALAX / GLYCOLAX) packet Take 17 g by mouth daily. 14 each 0  . SENSIPAR 90 MG tablet Take 90 mg by mouth 2 (two) times daily.    . vancomycin (VANCOCIN) 125 MG capsule Take 125 mg by mouth every other day. Starting 09/19/18 take 1 tablet every other day for 8 days. Started 10/07/2018 take 1 tablet every three days for 2 weeks.    Earnestine Mealing 625 MG tablet TAKE THREE TABLETS BY MOUTH TWICE DAILY WITH MEALS (Patient taking differently: Take 1,875 mg by mouth 2 (two) times daily with a meal. ) 180 tablet 11  . zolpidem (AMBIEN) 5 MG tablet Take 1 tablet (5 mg total) by mouth at bedtime as needed. for sleep (Patient taking differently: Take 5 mg by mouth at bedtime as needed for sleep. ) 90 tablet 1     ROS:                                                                                                                                       ROS was performed and is negative except as noted in HPI    General Examination:  Blood pressure 139/76, pulse 83, temperature 99.6 F (37.6 C), temperature source Oral, resp. rate 14, height 6\' 1"  (1.854 m), weight 98 kg, SpO2 99 %.  HEENT-  Normocephalic, no lesions, without obvious abnormality.  Normal external eye and conjunctiva. White Halos around both irises  Cardiovascular- S1-S2 audible, pulses palpable throughout  Lungs-no rhonchi or wheezing noted, no excessive working breathing.  Saturations within normal limits on RA Abdomen- All 4 quadrants palpated and non tender Extremities- Warm, dry and intact Musculoskeletal-no joint tenderness, deformity or swelling Skin-warm and dry, no hyperpigmentation, vitiligo, or suspicious lesions  Neurological Examination Mental Status: Alert, oriented, thought content appropriate. Naming intact. Speech fluent without  evidence of aphasia.  Able to follow  commands without difficulty. Cranial Nerves: II:  Visual fields grossly normal,  III,IV, VI: ptosis not present, extra-ocular motions intact bilaterally, pupils equal, round, reactive to light and accommodation V,VII: smile symmetric, facial light touch sensation normal bilaterally VIII: hearing normal bilaterally IX,X: uvula rises midline XI: bilateral shoulder shrug XII: midline tongue extension Motor: Right : Upper extremity   5/5  Left:     Upper extremity   4+/5 (no vertical drift)  Lower extremity   5/5   Lower extremity   4+/5 (no vertical drift) Tone and bulk:normal tone throughout; no atrophy noted Sensory: Pinprick and light touch intact throughout, bilaterally Deep Tendon Reflexes: diminished reflexes throughout Plantars: Right: downgoing   Left: downgoing Cerebellar: normal finger-to-nose,  normal heel-to-shin test Gait: deferred   Lab Results: Basic Metabolic Panel: Recent Labs  Lab 10/03/18 2342 10/03/18 2343 10/04/18 0520  NA  --  140 138  K  --  5.0 6.1*  CL  --  100 98  CO2  --  25 26  GLUCOSE  --  190* 176*  BUN  --  43* 47*  CREATININE  --  9.70* 10.43*  CALCIUM  --  9.1 9.3  MG 2.0  --   --     CBC: Recent Labs  Lab 10/03/18 2342 10/04/18 0520  WBC 11.8* 16.7*  NEUTROABS 9.7*  --   HGB 12.4* 11.9*  HCT 41.5 39.5  MCV 95.6 95.0  PLT 263 285    CBG: Recent Labs  Lab 10/03/18 2322 10/04/18 0822  GLUCAP 186* 138*    Imaging: Dg Chest 2 View  Result Date: 10/03/2018 CLINICAL DATA:  Altered mental status EXAM: CHEST - 2 VIEW COMPARISON:  08/23/2018 FINDINGS: Mild cardiomegaly. No acute consolidation or effusion. No pneumothorax. Postsurgical changes in the cervical spine. IMPRESSION: No active cardiopulmonary disease.  Cardiomegaly. Electronically Signed   By: Donavan Foil M.D.   On: 10/03/2018 23:59   Ct Head Wo Contrast  Result Date: 10/04/2018 CLINICAL DATA:  Altered mental status. EXAM: CT HEAD  WITHOUT CONTRAST TECHNIQUE: Contiguous axial images were obtained from the base of the skull through the vertex without intravenous contrast. COMPARISON:  CT 08/19/2018 FINDINGS: Brain: Brain volume is normal for age. Mild chronic small vessel ischemia, unchanged from prior. No intracranial hemorrhage, mass effect, or midline shift. No hydrocephalus. The basilar cisterns are patent. No evidence of territorial infarct or acute ischemia. No extra-axial or intracranial fluid collection. Vascular: Atherosclerosis of skullbase vasculature without hyperdense vessel or abnormal calcification. Skull: No fracture or focal lesion. Sinuses/Orbits: No acute finding. Trace mucosal thickening in the sphenoid sinus, unchanged. Other: None. IMPRESSION: No acute intracranial abnormality. Electronically Signed   By: Keith Rake M.D.   On: 10/04/2018 02:39   Ct Angio Chest Pe W Or Wo  Contrast  Result Date: 10/04/2018 CLINICAL DATA:  Hypoxia. Altered mental status. Dialysis patient. EXAM: CT ANGIOGRAPHY CHEST WITH CONTRAST TECHNIQUE: Multidetector CT imaging of the chest was performed using the standard protocol during bolus administration of intravenous contrast. Multiplanar CT image reconstructions and MIPs were obtained to evaluate the vascular anatomy. CONTRAST:  180mL ISOVUE-370 IOPAMIDOL (ISOVUE-370) INJECTION 76% COMPARISON:  Chest radiography 10/03/2018 FINDINGS: Cardiovascular: Pulmonary arterial opacification is good. There are no pulmonary emboli. The heart is enlarged. There is extensive coronary artery calcification. There is aortic atherosclerosis without aneurysm or dissection. Mediastinum/Nodes: No mass or lymphadenopathy. Lungs/Pleura: No pleural effusion. Mild dependent pulmonary atelectasis. No infiltrate or mass. Upper Abdomen: Air in the biliary tree as seen previously. No acute upper abdominal finding. Musculoskeletal: Ordinary thoracic spondylosis, most pronounced at T11-12. Review of the MIP images  confirms the above findings. IMPRESSION: 1. No pulmonary emboli or other acute chest pathology. 2. Air in the biliary tree as seen previously. Aortic Atherosclerosis (ICD10-I70.0). Electronically Signed   By: Nelson Chimes M.D.   On: 10/04/2018 07:29   Mr Brain Wo Contrast  Result Date: 10/04/2018 CLINICAL DATA:  Initial evaluation for acute weakness, confusion, altered mental status. EXAM: MRI HEAD WITHOUT CONTRAST TECHNIQUE: Multiplanar, multiecho pulse sequences of the brain and surrounding structures were obtained without intravenous contrast. COMPARISON:  Prior CT from earlier the same day. FINDINGS: Brain: Mild diffuse prominence of the CSF containing spaces compatible with generalized age-related cerebral atrophy. Patchy and confluent T2/FLAIR hyperintensity within the periventricular and deep white matter both cerebral hemispheres most compatible with chronic microvascular ischemic change, moderate nature. Chronic microvascular ischemic disease noted within the pons as well. Subcentimeter remote right cerebellar infarct noted. 6 mm focus of diffusion abnormality within the right cerebellar hemisphere consistent with a small acute ischemic infarct (series 5, image 57). No associated hemorrhage or mass effect. No other evidence for acute or subacute ischemia. Gray-white matter differentiation otherwise maintained. No evidence for acute or chronic intracranial hemorrhage. No mass lesion, midline shift or mass effect. No hydrocephalus. No extra-axial fluid collection. Pituitary gland within normal limits. Vascular: Major intracranial vascular flow voids are maintained. Skull and upper cervical spine: Craniocervical junction within normal limits. Postsurgical changes noted within the upper cervical spine, partially visualized. No focal marrow replacing lesion. Scalp soft tissues unremarkable. Sinuses/Orbits: Globes and orbital soft tissues within normal limits. Mild chronic mucosal thickening seen throughout  the paranasal sinuses. No air-fluid levels to suggest acute sinusitis. Trace opacity bilateral mastoid air cells, of doubtful significance. Inner ear structures grossly normal. Other: None. IMPRESSION: 1. 6 mm acute ischemic nonhemorrhagic right cerebellar infarct. 2. Underlying age-related cerebral atrophy with moderate chronic small vessel ischemic disease, with additional subcentimeter chronic right cerebellar infarct. Electronically Signed   By: Jeannine Boga M.D.   On: 10/04/2018 06:22   Ct Renal Stone Study  Result Date: 10/04/2018 CLINICAL DATA:  Right flank pain EXAM: CT ABDOMEN AND PELVIS WITHOUT CONTRAST TECHNIQUE: Multidetector CT imaging of the abdomen and pelvis was performed following the standard protocol without IV contrast. COMPARISON:  08/20/2018 FINDINGS: Lower chest: 4 mm right lower lobe pulmonary nodule on image 7 is stable dating back to 09/27/2011. Hepatobiliary: Postcholecystectomy and pneumobilia are stable. No obvious liver mass. Pancreas: Unremarkable Spleen: Unremarkable Adrenals/Urinary Tract: Adrenal glands are within normal limits. There is advanced atrophy of both kidneys. Several low-density lesions are scattered throughout both kidneys. They are not significantly changed. There is no hydronephrosis. Calcifications within the central kidneys are likely vascular. These are also stable.  No definite ureteral calculus or hydroureter. Bladder is decompressed. Stomach/Bowel: Normal appendix. There is no obvious mass in the visualized colon. No disproportionate dilatation of small bowel to suggest obstruction. Nonspecific air-fluid levels in small bowel are noted. Stomach is decompressed. Vascular/Lymphatic: Atherosclerotic calcifications of the aorta and iliac arteries are present. Maximal diameter of the abdominal aorta is 2.6 cm. Reproductive: There surgical clips in the prostate gland. Other: No free fluid. Musculoskeletal: Spinal fusion hardware is in place. There is solid  fusion across the L4-5 disc space. Ray cage is are in place at the L3-4 disc space. There is some bony bridging across the posterior elements at this level. No vertebral compression. IMPRESSION: No evidence of urinary calculus or urinary obstruction. Stable exam. Electronically Signed   By: Marybelle Killings M.D.   On: 10/04/2018 08:57      Laurey Morale, MSN, NP-C [facility] [phone]   10/04/2018, 10:02 AM   Attending physician note to follow with Assessment and plan .  Attending Neurohospitalist Addendum Patient seen and examined with APP/Resident. Agree with the history and physical as documented above.    Assessment: 68 y.o. male  With PMH peripheral neuropathy, OSA ( CPAP at night), HTN,hyperthyroidism, CAD,, combine systolic and diastolic HF, DM 2, ESRD ( on hemodialysis) who presented to [facility] [facility] with c/o AMS. Neurology consulted for stroke seen on MRI. MRI: 6 mm right cerebellar infarct. CTH: no hemorrhage.  No TPA: outside of window.   This is not likely to explain his "AMS", but given that patient was recently admitted for sepsis; there is always a concern for septic emboli. He also has multiple other cerebrovascular risk factors.  Admit for further stroke work-up.  Stroke Risk Factors - diabetes mellitus, hyperlipidemia and hypertension   Recommendations: -- BP goal : Permissive HTN upto 220/110 mmHg  --MRA of the head -carotid dopplers --Echocardiogram -- Hold ASA till echocardiogram is done and is negative for vegetations --Blood cultures -- High intensity Statin if LDL > 70 -- HgbA1c, fasting lipid panel -- PT consult, OT consult, Speech consult --Telemetry monitoring --Frequent neuro checks --Stroke swallow screen   I have independently reviewed the chart, obtained history, review of systems and examined the patient.I have personally reviewed pertinent head/neck/spine imaging (CT/MRI).  --please page stroke NP  Or  PA  Or MD from 8am -4 pm  as this  patient from this time will be  followed by the stroke.   You can look them up on [email or web address]  Password [email or web address]   Amie Portland, MD [facility] Pager: [phone]  If 7pm to 7am, please call on call as listed on AMION.

## 2018-10-04 NOTE — ED Notes (Signed)
Pt transported to CT ?

## 2018-10-04 NOTE — Progress Notes (Signed)
PHARMACY - PHYSICIAN COMMUNICATION CRITICAL VALUE ALERT - BLOOD CULTURE IDENTIFICATION (BCID)  [name] is an 68 y.o. male who presented to [facility] on 10/03/2018 with a chief complaint of confusion   Assessment:  Sepsis  Name of physician (or Provider) Contacted: Hosptialist  Current antibiotics: None  Changes to prescribed antibiotics recommended:  Add Ceftriaxone 2 grams iv Q 24  Results for orders placed or performed during the hospital encounter of 10/03/18  Blood Culture ID Panel (Reflexed) (Collected: 10/04/2018 12:01 AM)  Result Value Ref Range   Enterococcus species NOT DETECTED NOT DETECTED   Listeria monocytogenes NOT DETECTED NOT DETECTED   Staphylococcus species NOT DETECTED NOT DETECTED   Staphylococcus aureus (BCID) NOT DETECTED NOT DETECTED   Streptococcus species NOT DETECTED NOT DETECTED   Streptococcus agalactiae NOT DETECTED NOT DETECTED   Streptococcus pneumoniae NOT DETECTED NOT DETECTED   Streptococcus pyogenes NOT DETECTED NOT DETECTED   Acinetobacter baumannii NOT DETECTED NOT DETECTED   Enterobacteriaceae species DETECTED (A) NOT DETECTED   Enterobacter cloacae complex NOT DETECTED NOT DETECTED   Escherichia coli NOT DETECTED NOT DETECTED   Klebsiella oxytoca NOT DETECTED NOT DETECTED   Klebsiella pneumoniae DETECTED (A) NOT DETECTED   Proteus species NOT DETECTED NOT DETECTED   Serratia marcescens NOT DETECTED NOT DETECTED   Carbapenem resistance NOT DETECTED NOT DETECTED   Haemophilus influenzae NOT DETECTED NOT DETECTED   Neisseria meningitidis NOT DETECTED NOT DETECTED   Pseudomonas aeruginosa NOT DETECTED NOT DETECTED   Candida albicans NOT DETECTED NOT DETECTED   Candida glabrata NOT DETECTED NOT DETECTED   Candida krusei NOT DETECTED NOT DETECTED   Candida parapsilosis NOT DETECTED NOT DETECTED   Candida tropicalis NOT DETECTED NOT DETECTED    [name] 10/04/2018  7:18 PM

## 2018-10-04 NOTE — ED Notes (Signed)
Messaged pharmacy about missing medications for pt

## 2018-10-04 NOTE — Consult Note (Signed)
[facility]    NEPHROLOGY CONSULTATION NOTE  PATIENT ID:  [name], DOB:  [birth year]  HPI: The patient is a 68 y.o. year old male with a past medical history significant for end-stage renal disease on hemodialysis Tuesday, Thursday, and Saturday, coronary artery disease, hypothyroidism, gout, multiple episodes of bacteremia, and C. difficile who presented to the emergency department after a couple of days of feeling tired and weak.  He was recently treated for C. difficile and is completing his course of vancomycin.  Last evening he found it difficult to walk and had to rest in his bed after which the wife found him very confused and reported he had a bowel movement.  He was apparently confused for approximately an hour to, and had some associated chest pressure.  He had just started on prednisone yesterday for a gout attack of his left wrist.  He was reportedly hypoxic on admission.  On evaluation, the patient reports feeling generally well, and denies any shortness of breath or chest pain.  Renal consultation has been called for end-stage renal disease on hemodialysis.   Past Medical History:  Diagnosis Date  . Allergic rhinitis, cause unspecified 02/24/2014  . Anemia 06/16/2011  . BENIGN PROSTATIC HYPERTROPHY 10/14/2009  . CAD, NATIVE VESSEL 02/06/2009   a. 06/2007 s/p Taxus DES to the RCA;  b. 08/2016 NSTEMI in setting of SVT/PCI: LM 30ost, LAD 31m (3.0x16 Synergy DES), LCX 46m, OM1 60, RI 40, RCA 70p/m, 28m - not amenable to PCI.  [name] Cervical radiculopathy, chronic 02/23/2016   Right c5-6 by NCS/EMG  . Chronic combined systolic (congestive) and diastolic (congestive) heart failure ([place])    a. 10/2016 Echo: EF 40-45%, Gr2 DD. mildly dil LA.  [name] COLONIC POLYPS, HX OF 10/14/2009  . Dementia ([place]) [id number]  . Depression 09/24/2015  . DIABETES MELLITUS, TYPE II 02/01/2010  . DYSLIPIDEMIA 06/18/2007  . ESRD (end stage renal disease) on dialysis ([place]) 08/04/2010   ESRD due to DM adn  HTN, started HD in 2011. As of 2019 has a R arm graft and gets HD on TTS sched  . FOOT PAIN 08/12/2008  . GAIT DISTURBANCE 03/03/2010  . GASTROENTERITIS, VIRAL 10/14/2009  . GERD 06/18/2007  . GOITER, MULTINODULAR 12/26/2007  . GOUT 06/18/2007  . GYNECOMASTIA 07/17/2010  . Hemodialysis access, fistula mature [facility])    Dialysis T-Th-Sa ([place]) Right upper arm fistula  . Hyperlipidemia 10/16/2011  . Hyperparathyroidism, secondary ([place]) 06/16/2011  . HYPERTENSION 06/18/2007  . Hyperthyroidism   . Hypocalcemia 06/07/2010  . Ischemic cardiomyopathy    a. 10/2016 Echo: EF 40-45%.  . Lumbar stenosis with neurogenic claudication   . ONYCHOMYCOSIS, TOENAILS 12/26/2007  . OSA on CPAP 10/16/2011  . Other malaise and fatigue 11/24/2009  . PERIPHERAL NEUROPATHY 06/18/2007  . Prostate cancer ([place])   . PSVT (paroxysmal supraventricular tachycardia) ([place])    a. 78/2956 complicated by NSTEMI;  b. 11/2016 Treated w/ adenosine in ED;  c. 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  [name] PULMONARY NODULE, RIGHT LOWER LOBE 06/08/2009  . Sleep apnea    cpap machine and o2  . TRANSAMINASES, SERUM, ELEVATED 02/01/2010  . Transfusion history    none recent  . Unspecified hypotension 01/30/2010    Past Surgical History:  Procedure Laterality Date  . A/V SHUNTOGRAM N/A 04/07/2018   Procedure: A/V SHUNTOGRAM;  Surgeon: Waynetta Sandy, MD;  Location: [place] CV LAB;  Service: Cardiovascular;  Laterality: N/A;  . ARTERIOVENOUS GRAFT PLACEMENT Right 2009  forearm/notes 02/01/2011  . AV FISTULA PLACEMENT  11/07/2011   Procedure: INSERTION OF ARTERIOVENOUS (AV) GORE-TEX GRAFT ARM;  Surgeon: Tinnie Gens, MD;  Location: [place];  Service: Vascular;  Laterality: Left;  . BACK SURGERY  1998  . BASCILIC VEIN TRANSPOSITION Right 02/27/2013   Procedure: BASCILIC VEIN TRANSPOSITION;  Surgeon: Mal Misty, MD;  Location: [place];  Service: Vascular;  Laterality: Right;  Right Basilic Vein Transposition   . CARDIAC  CATHETERIZATION N/A 08/06/2016   Procedure: Left Heart Cath and Coronary Angiography;  Surgeon: Jolaine Artist, MD;  Location: [place] CV LAB;  Service: Cardiovascular;  Laterality: N/A;  . CARDIAC CATHETERIZATION N/A 08/07/2016   Procedure: Coronary/Graft Atherectomy-CSI LAD;  Surgeon: Peter M Martinique, MD;  Location: [place] CV LAB;  Service: Cardiovascular;  Laterality: N/A;  . CERVICAL SPINE SURGERY  2/09   "to repair nerve problems in my left arm"  . CHOLECYSTECTOMY    . COLONOSCOPY WITH PROPOFOL N/A 04/26/2017   Procedure: COLONOSCOPY WITH PROPOFOL;  Surgeon: Otis Brace, MD;  Location: [place];  Service: Gastroenterology;  Laterality: N/A;  . CORONARY ANGIOPLASTY WITH STENT PLACEMENT  06/11/2008  . CORONARY ANGIOPLASTY WITH STENT PLACEMENT  06/2007   TAXUS stent to RCA/notes 01/31/2011  . ESOPHAGOGASTRODUODENOSCOPY  09/28/2011   Procedure: ESOPHAGOGASTRODUODENOSCOPY (EGD);  Surgeon: Missy Sabins, MD;  Location: [facility] ENDOSCOPY;  Service: Endoscopy;  Laterality: N/A;  . ESOPHAGOGASTRODUODENOSCOPY N/A 04/07/2015   Procedure: ESOPHAGOGASTRODUODENOSCOPY (EGD);  Surgeon: Teena Irani, MD;  Location: [facility];  Service: Endoscopy;  Laterality: N/A;  . ESOPHAGOGASTRODUODENOSCOPY N/A 04/19/2015   Procedure: ESOPHAGOGASTRODUODENOSCOPY (EGD);  Surgeon: Arta Silence, MD;  Location: [facility]COPY;  Service: Endoscopy;  Laterality: N/A;  . ESOPHAGOGASTRODUODENOSCOPY (EGD) WITH PROPOFOL N/A 06/13/2018   Procedure: ESOPHAGOGASTRODUODENOSCOPY (EGD) WITH PROPOFOL;  Surgeon: Wonda Horner, MD;  Location[facility] ENDOSCOPY;  Service: Endoscopy;  Laterality: N/A;  . FLEXIBLE SIGMOIDOSCOPY N/A 05/21/2017   Procedure: Conni Elliot;  Surgeon: Clarene Essex, MD;  Location: [place];  Service: Endoscopy;  Laterality: N/A;  . FLEXIBLE SIGMOIDOSCOPY Left 07/02/2017   Procedure: FLEXIBLE SIGMOIDOSCOPY;  Surgeon: Laurence Spates, MD;  Location: [place];  Service: Endoscopy;  Laterality:  Left;  . FOREIGN BODY REMOVAL  09/2003   via upper endoscopy/notes 02/12/2011  . GIVENS CAPSULE STUDY  09/30/2011   Procedure: GIVENS CAPSULE STUDY;  Surgeon: Jeryl Columbia, MD;  Location: [facility] ENDOSCOPY;  Service: Endoscopy;  Laterality: N/A;  . INSERTION OF DIALYSIS CATHETER Right 2014  . INSERTION OF DIALYSIS CATHETER Left 02/11/2013   Procedure: INSERTION OF DIALYSIS CATHETER;  Surgeon: Conrad Van Buren, MD;  Location: [place];  Service: Vascular;  Laterality: Left;  Ultrasound guided  . LUMBAR LAMINECTOMY/DECOMPRESSION MICRODISCECTOMY Bilateral 07/31/2017   Procedure: LAMINECTOMY AND FORAMINOTOMY- BILATERAL LUMBAR TWO- LUMBAR THREE;  Surgeon: Earnie Larsson, MD;  Location: [place];  Service: Neurosurgery;  Laterality: Bilateral;  LAMINECTOMY AND FORAMINOTOMY- BILATERAL LUMBAR 2- LUMBAR 3  . PERIPHERAL VASCULAR BALLOON ANGIOPLASTY  04/07/2018   Procedure: PERIPHERAL VASCULAR BALLOON ANGIOPLASTY;  Surgeon: Waynetta Sandy, MD;  Location: [place] CV LAB;  Service: Cardiovascular;;  RUE AVF  . REMOVAL OF A DIALYSIS CATHETER Right 02/11/2013   Procedure: REMOVAL OF A DIALYSIS CATHETER;  Surgeon: Conrad Park View, MD;  Location: [place];  Service: Vascular;  Laterality: Right;  . SAVORY DILATION N/A 04/07/2015   Procedure: SAVORY DILATION;  Surgeon: Teena Irani, MD;  Location: [facility] ENDOSCOPY;  Service: Endoscopy;  Laterality: N/A;  . SHUNTOGRAM N/A 09/20/2011   Procedure:  SHUNTOGRAM;  Surgeon: Conrad Hayden, MD;  Location: [facility] CATH LAB;  Service: Cardiovascular;  Laterality: N/A;  . SVT ABLATION N/A 11/26/2016   Procedure: SVT Ablation;  Surgeon: Will Meredith Leeds, MD;  Location: [place] CV LAB;  Service: Cardiovascular;  Laterality: N/A;  . TEE WITHOUT CARDIOVERSION N/A 08/25/2018   Procedure: TRANSESOPHAGEAL ECHOCARDIOGRAM (TEE);  Surgeon: Dorothy Spark, MD;  Location: [facility] ENDOSCOPY;  Service: Cardiovascular;  Laterality: N/A;  . TONSILLECTOMY    . TOTAL KNEE ARTHROPLASTY Right 08/02/2015   Procedure:  TOTAL KNEE ARTHROPLASTY;  Surgeon: Renette Butters, MD;  Location: [place];  Service: Orthopedics;  Laterality: Right;  . VENOGRAM N/A 01/26/2013   Procedure: VENOGRAM;  Surgeon: Angelia Mould, MD;  Location: [facility] CATH LAB;  Service: Cardiovascular;  Laterality: N/A;    Family History  Problem Relation Age of Onset  . Heart disease Sister   . Thyroid nodules Sister   . Heart disease Father   . Diabetes Father   . Kidney failure Father   . Hypertension Father   . Healthy Child   . Healthy Child   . Healthy Child   . Cancer Neg Hx     Social History   Tobacco Use  . Smoking status: Former Smoker    Packs/day: 1.00    Years: 25.00    Pack years: 25.00    Types: Cigarettes    Last attempt to quit: 10/01/2005    Years since quitting: 13.0  . Smokeless tobacco: Former Systems developer    Quit date: 10/01/2005  . Tobacco comment: Quit smoking 2007 Smoked x 25 years 1/2 ppd.  Substance Use Topics  . Alcohol use: No    Alcohol/week: 0.0 standard drinks  . Drug use: No    REVIEW OF SYSTEMS: General:  no fatigue, positive weakness Head:  no headaches Eyes:  no blurred vision ENT:  no sore throat Neck:  no masses CV:  no chest pain, no orthopnea Lungs:  no shortness of breath, no cough GI:  no nausea or vomiting, no diarrhea GU:  no dysuria or hematuria Skin:  no rashes or lesions Neuro:  no focal numbness or weakness Psych:  no depression or anxiety    PHYSICAL EXAM:  Vitals:   10/04/18 1330 10/04/18 1500  BP: 110/75 (!) 155/81  Pulse: 74 71  Resp: 13 13  Temp:    SpO2: 95% 95%   No intake/output data recorded.   General:  AAOx3 NAD HEENT: MMM [place] AT anicteric sclera Neck:  No JVD, no adenopathy CV:  Heart RRR  Lungs:  L/S CTA bilaterally Abd:  abd SNT/ND with normal BS GU:  Bladder non-palpable Extremities:  No LE edema, right upper extremity AV fistula with a good thrill and bruit Skin:  No skin rash Psych:  normal mood and affect Neuro:  no focal  deficits  MEDICATIONS:   Current Facility-Administered Medications:  .  acetaminophen (TYLENOL) tablet 650 mg, 650 mg, Oral, Q6H PRN, 650 mg at 10/04/18 1025 **OR** acetaminophen (TYLENOL) suppository 650 mg, 650 mg, Rectal, Q6H PRN, Rise Patience, MD .  allopurinol (ZYLOPRIM) tablet 100 mg, 100 mg, Oral, Daily, Rise Patience, MD .  Derrill Memo ON 10/05/2018] Chlorhexidine Gluconate Cloth 2 % PADS 6 each, 6 each, Topical, Q0600, Penny Frisbie A, DO .  cinacalcet (SENSIPAR) tablet 90 mg, 90 mg, Oral, BID WC, Rise Patience, MD, 90 mg at 10/04/18 1333 .  citalopram (CELEXA) tablet 10 mg, 10 mg, Oral, QHS, Gean Birchwood  N, MD .  colchicine tablet 0.6 mg, 0.6 mg, Oral, Daily, Rise Patience, MD, 0.6 mg at 10/04/18 1027 .  colesevelam [facility]) tablet 1,875 mg, 1,875 mg, Oral, BID WC, Rise Patience, MD, 1,875 mg at 10/04/18 1334 .  ezetimibe (ZETIA) tablet 10 mg, 10 mg, Oral, Daily, Rise Patience, MD .  feeding supplement (NEPRO CARB STEADY) liquid 237 mL, 237 mL, Oral, BID BM, Rise Patience, MD .  ferric citrate (AURYXIA) tablet 420 mg, 420 mg, Oral, TID WC, Rise Patience, MD, 420 mg at 10/04/18 1337 .  gabapentin (NEURONTIN) capsule 100 mg, 100 mg, Oral, QHS, Rise Patience, MD .  heparin injection 5,000 Units, 5,000 Units, Subcutaneous, Q8H, Rise Patience, MD, 5,000 Units at 10/04/18 0751 .  insulin aspart (novoLOG) injection 0-9 Units, 0-9 Units, Subcutaneous, TID WC, Rise Patience, MD .  methimazole (TAPAZOLE) tablet 2.5 mg, 2.5 mg, Oral, Daily, Rise Patience, MD, 2.5 mg at 10/04/18 1252 .  multivitamin (RENA-VIT) tablet 1 tablet, 1 tablet, Oral, QHS, Rise Patience, MD .  ondansetron [facility]) tablet 4 mg, 4 mg, Oral, Q6H PRN, 4 mg at 10/04/18 0751 **OR** ondansetron (ZOFRAN) injection 4 mg, 4 mg, Intravenous, Q6H PRN, Rise Patience, MD .  polyethylene glycol (MIRALAX / GLYCOLAX) packet 17 g, 17 g,  Oral, Daily, Gean Birchwood N, MD .  predniSONE (DELTASONE) tablet 10 mg, 10 mg, Oral, BID WC, Rise Patience, MD, 10 mg at 10/04/18 0751 .  sodium zirconium cyclosilicate (LOKELMA) packet 10 g, 10 g, Oral, Once, Dessa Phi, DO .  vancomycin (VANCOCIN) 50 mg/mL oral solution 125 mg, 125 mg, Oral, Q48H, 125 mg at 10/04/18 1252 **FOLLOWED BY** [START ON 10/08/2018] vancomycin (VANCOCIN) 50 mg/mL oral solution 125 mg, 125 mg, Oral, Q72H, Dessa Phi, DO  Current Outpatient Medications:  .  acetaminophen (TYLENOL) 325 MG tablet, Take 650 mg by mouth every 6 (six) hours as needed for mild pain or headache. , Disp: , Rfl:  .  Alirocumab (PRALUENT) 150 MG/ML SOPN, Inject 150 mg into the skin every 14 (fourteen) days., Disp: 2 pen, Rfl: 12 .  allopurinol (ZYLOPRIM) 100 MG tablet, TAKE ONE TABLET BY MOUTH ONCE DAILY (Patient taking differently: Take 100 mg by mouth daily. ), Disp: 90 tablet, Rfl: 3 .  aspirin EC 81 MG tablet, Take 81 mg by mouth daily., Disp: , Rfl:  .  betamethasone dipropionate (DIPROLENE) 0.05 % cream, Apply 1 application topically daily as needed (leg sores)., Disp: , Rfl:  .  citalopram (CELEXA) 10 MG tablet, Take 1 tablet (10 mg total) by mouth at bedtime., Disp: 90 tablet, Rfl: 3 .  colchicine 0.6 MG tablet, Take 1 tablet (0.6 mg total) by mouth daily., Disp: 90 tablet, Rfl: 3 .  doxercalciferol ([name]) 4 MCG/2ML injection, Inject 1 mL (2 mcg total) into the vein Every Tuesday,Thursday,and Saturday with dialysis., Disp: 2 mL, Rfl: 0 .  ezetimibe (ZETIA) 10 MG tablet, TAKE ONE TABLET BY MOUTH ONCE DAILY (Patient taking differently: Take 10 mg by mouth daily. ), Disp: 90 tablet, Rfl: 3 .  ferric citrate (AURYXIA) 1 GM 210 MG(Fe) tablet, Take 2 tablets (420 mg total) by mouth 3 (three) times daily with meals., Disp: 60 tablet, Rfl: 0 .  gabapentin (NEURONTIN) 100 MG capsule, Take 1 capsule (100 mg total) by mouth at bedtime., Disp: 30 capsule, Rfl: 0 .  hydroxypropyl  methylcellulose / hypromellose (ISOPTO TEARS / GONIOVISC) 2.5 % ophthalmic solution, Place 1 drop into  both eyes 3 (three) times daily as needed for dry eyes., Disp: 15 mL, Rfl: 11 .  methimazole (TAPAZOLE) 5 MG tablet, Take 0.5 tablets (2.5 mg total) by mouth daily., Disp: 45 tablet, Rfl: 3 .  multivitamin (RENA-VIT) TABS tablet, Take 1 tablet by mouth daily. Reported on 10/24/2015, Disp: , Rfl:  .  Nutritional Supplements (FEEDING SUPPLEMENT, NEPRO CARB STEADY,) LIQD, Take 237 mLs by mouth 2 (two) times daily between meals., Disp: 7 Can, Rfl: 0 .  polyethylene glycol (MIRALAX / GLYCOLAX) packet, Take 17 g by mouth daily., Disp: 14 each, Rfl: 0 .  SENSIPAR 90 MG tablet, Take 90 mg by mouth 2 (two) times daily., Disp: , Rfl:  .  vancomycin (VANCOCIN) 125 MG capsule, Take 125 mg by mouth every other day. Starting 09/19/18 take 1 tablet every other day for 8 days. Started 10/07/2018 take 1 tablet every three days for 2 weeks., Disp: , Rfl:  .  WELCHOL 625 MG tablet, TAKE THREE TABLETS BY MOUTH TWICE DAILY WITH MEALS (Patient taking differently: Take 1,875 mg by mouth 2 (two) times daily with a meal. ), Disp: 180 tablet, Rfl: 11 .  zolpidem (AMBIEN) 5 MG tablet, Take 1 tablet (5 mg total) by mouth at bedtime as needed. for sleep (Patient taking differently: Take 5 mg by mouth at bedtime as needed for sleep. ), Disp: 90 tablet, Rfl: 1     LABS:  CBC Latest Ref Rng & Units 10/04/2018 10/03/2018 09/12/2018  WBC 4.0 - 10.5 K/uL 16.7(H) 11.8(H) 5.2  Hemoglobin 13.0 - 17.0 g/dL 11.9(L) 12.4(L) 12.3(L)  Hematocrit 39.0 - 52.0 % 39.5 41.5 37.1(L)  Platelets 150 - 400 K/uL 285 263 282    CMP Latest Ref Rng & Units 10/04/2018 10/03/2018 08/30/2018  Glucose 70 - 99 mg/dL 176(H) 190(H) 154(H)  BUN 8 - 23 mg/dL 47(H) 43(H) 46(H)  Creatinine 0.61 - 1.24 mg/dL 10.43(H) 9.70(H) 9.53(H)  Sodium 135 - 145 mmol/L 138 140 139  Potassium 3.5 - 5.1 mmol/L 6.1(H) 5.0 4.3  Chloride 98 - 111 mmol/L 98 100 102  CO2 22 - 32  mmol/L 26 25 23   Calcium 8.9 - 10.3 mg/dL 9.3 9.1 9.0  Total Protein 6.5 - 8.1 g/dL - 7.4 -  Total Bilirubin 0.3 - 1.2 mg/dL - 2.1(H) -  Alkaline Phos 38 - 126 U/L - 235(H) -  AST 15 - 41 U/L - 471(H) -  ALT 0 - 44 U/L - 439(H) -    Lab Results  Component Value Date   CALCIUM 9.3 10/04/2018   CAION 0.95 (L) 08/19/2018   PHOS 4.4 08/30/2018       Component Value Date/Time   COLORURINE LT. YELLOW 06/26/2013 1200   APPEARANCEUR CLEAR 06/26/2013 1200   LABSPEC 1.020 06/26/2013 1200   PHURINE 8.5 06/26/2013 1200   GLUCOSEU 100 06/26/2013 1200   HGBUR MODERATE 06/26/2013 1200   BILIRUBINUR NEGATIVE 06/26/2013 1200   KETONESUR NEGATIVE 06/26/2013 1200   PROTEINUR >300 (A) 01/04/2010 1032   UROBILINOGEN 0.2 06/26/2013 1200   NITRITE NEGATIVE 06/26/2013 1200   LEUKOCYTESUR TRACE 06/26/2013 1200      Component Value Date/Time   PHART 7.410 08/20/2018 1113   PCO2ART 32.7 08/20/2018 1113   PO2ART 82.3 (L) 08/20/2018 1113   HCO3 19.7 (L) 08/20/2018 1113   TCO2 28 08/19/2018 1716   ACIDBASEDEF 3.7 (H) 08/20/2018 1113   O2SAT 94.3 08/20/2018 1113       Component Value Date/Time   IRON 16 (L) 06/12/2018 0500  TIBC 168 (L) 06/12/2018 0500   FERRITIN 709 (H) 10/17/2016 1103   IRONPCTSAT 10 (L) 06/12/2018 0500       ASSESSMENT/PLAN:     Problem List Items Addressed This Visit      Digestive   Acute hepatitis    Other Visit Diagnoses    Hypoxia    -  Primary   Confusion       End-stage renal disease on hemodialysis ([place])       Anemia associated with chronic renal failure       Elevated lactic acid level       Hepatitis       Relevant Orders   US Abdomen Limited RUQ (Completed)      1.  End-stage renal disease on hemodialysis.  We will resume his dialysis on Tuesday, Thursday, and Saturday.  His volume status is stable.  His potassium is slightly elevated.  We will place him on the schedule for today.  Lokelma given by hospitalist earlier today.  2.  C. difficile.   Remains on vancomycin.  3.  Type 2 diabetes.  Continue outpatient treatment.  4.  Anemia of chronic kidney disease.  This is above goal.  Will hold on any inpatient erythropoietin therapy.      Tony, DO, [place]

## 2018-10-04 NOTE — ED Notes (Signed)
Patient transported to Ultrasound 

## 2018-10-04 NOTE — ED Notes (Signed)
Ordered diet tray 

## 2018-10-04 NOTE — ED Notes (Signed)
ED TO INPATIENT HANDOFF REPORT  Name/Age/Gender [name] 68 y.o. male  Code Status    Code Status Orders  (From admission, onward)         Start     Ordered   10/04/18 0457  Full code  Continuous     10/04/18 0458        Code Status History    Date Active Date Inactive Code Status Order ID Comments User Context   08/19/2018 2046 08/31/2018 1557 Full Code [id number]  Rise Patience, MD ED   07/16/2018 1815 07/23/2018 1927 Full Code [id number]  Janora Norlander, MD ED   06/11/2018 0336 06/15/2018 1543 Full Code [id number]  Rise Patience, MD ED   01/15/2018 1927 01/16/2018 2152 Full Code [id number]  Ivor Costa, MD ED   07/31/2017 1207 08/01/2017 2017 Full Code [id number]  Earnie Larsson, MD Inpatient   07/01/2017 1742 07/05/2017 1636 Full Code [id number]  Jani Gravel, MD ED   05/17/2017 0022 05/22/2017 1835 Full Code [id number]  Arnell Asal, NP ED   04/22/2017 2106 04/27/2017 1826 Full Code [id number]  Vianne Bulls, MD ED   12/27/2016 0329 12/28/2016 1747 Full Code [id number]  Rise Patience, MD Inpatient   11/26/2016 1117 11/26/2016 1938 Full Code [id number]  Constance Haw, MD Inpatient   10/16/2016 2211 10/18/2016 2257 Full Code [id number]  Rise Patience, MD ED   08/05/2016 0121 08/08/2016 1626 Full Code [id number]  Lily Kocher, MD Inpatient   08/09/2015 0533 08/10/2015 2015 Full Code [id number]  Norval Morton, MD Inpatient   08/09/2015 0533 08/09/2015 0533 Full Code [id number]  Norval Morton, MD Inpatient   08/02/2015 1802 08/05/2015 1617 Full Code [id number]  Lovett Calender, PA-C Inpatient   04/18/2015 1756 04/21/2015 1712 Full Code [id number]  Oswald Hillock, MD Inpatient   09/26/2011 1806 10/01/2011 1549 Full Code [id number]  Louellen Molder, MD Inpatient      Home/SNF/Other Home  Chief Complaint AMS  Level of Care/Admitting Diagnosis ED Disposition    ED Disposition Condition [place]: [place] [100100]  Level of  Care: Medical Telemetry [104]  Diagnosis: Acute respiratory failure with hypoxia [facility]) [[id number]]  Admitting Physician: Rise Patience [phone]  Attending Physician: Rise Patience [phone]  Estimated length of stay: past midnight tomorrow  Certification:: I certify this patient will need inpatient services for at least 2 midnights  PT Class (Do Not Modify): Inpatient [101]  PT Acc Code (Do Not Modify): Private [1]       Medical History Past Medical History:  Diagnosis Date  . Allergic rhinitis, cause unspecified 02/24/2014  . Anemia 06/16/2011  . BENIGN PROSTATIC HYPERTROPHY 10/14/2009  . CAD, NATIVE VESSEL 02/06/2009   a. 06/2007 s/p Taxus DES to the RCA;  b. 08/2016 NSTEMI in setting of SVT/PCI: LM 30ost, LAD 61m (3.0x16 Synergy DES), LCX 71m, OM1 60, RI 40, RCA 70p/m, 32m - not amenable to PCI.  [name] Cervical radiculopathy, chronic 02/23/2016   Right c5-6 by NCS/EMG  . Chronic combined systolic (congestive) and diastolic (congestive) heart failure ([place])    a. 10/2016 Echo: EF 40-45%, Gr2 DD. mildly dil LA.  [name] COLONIC POLYPS, HX OF 10/14/2009  . Dementia ([place]) [id number]  . Depression 09/24/2015  . DIABETES MELLITUS, TYPE II 02/01/2010  . DYSLIPIDEMIA 06/18/2007  . ESRD (end stage renal disease) on dialysis ([place]) 08/04/2010   ESRD due to DM adn HTN, started HD in 2011. As of  2019 has a R arm graft and gets HD on TTS sched  . FOOT PAIN 08/12/2008  . GAIT DISTURBANCE 03/03/2010  . GASTROENTERITIS, VIRAL 10/14/2009  . GERD 06/18/2007  . GOITER, MULTINODULAR 12/26/2007  . GOUT 06/18/2007  . GYNECOMASTIA 07/17/2010  . Hemodialysis access, fistula mature [facility])    Dialysis T-Th-Sa ([place]) Right upper arm fistula  . Hyperlipidemia 10/16/2011  . Hyperparathyroidism, secondary ([place]) 06/16/2011  . HYPERTENSION 06/18/2007  . Hyperthyroidism   . Hypocalcemia 06/07/2010  . Ischemic cardiomyopathy    a. 10/2016 Echo: EF 40-45%.  . Lumbar stenosis with neurogenic claudication   .  ONYCHOMYCOSIS, TOENAILS 12/26/2007  . OSA on CPAP 10/16/2011  . Other malaise and fatigue 11/24/2009  . PERIPHERAL NEUROPATHY 06/18/2007  . Prostate cancer ([place])   . PSVT (paroxysmal supraventricular tachycardia) ([place])    a. 16/0737 complicated by NSTEMI;  b. 11/2016 Treated w/ adenosine in ED;  c. 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  [name] PULMONARY NODULE, RIGHT LOWER LOBE 06/08/2009  . Sleep apnea    cpap machine and o2  . TRANSAMINASES, SERUM, ELEVATED 02/01/2010  . Transfusion history    none recent  . Unspecified hypotension 01/30/2010    Allergies Allergies  Allergen Reactions  . Cephalexin Swelling and Other (See Comments)    Tolerated Rocephin 10/19. Tongue swelling, but no breathing issues Tolerated Zosyn in Sept 2019 without any issues  . Statins Other (See Comments)    Weak muscles  . Ciprofloxacin Rash    IV Location/Drains/Wounds Patient Lines/Drains/Airways Status   Active Line/Drains/Airways    Name:   Placement date:   Placement time:   Site:   Days:   Peripheral IV 10/03/18 Left Hand   10/03/18    2300    Hand   1   Peripheral IV 10/04/18 Left Antecubital   10/04/18    0646    Antecubital   less than 1   Fistula / Graft   08/01/17    1223    -   429   Fistula / Graft Right Upper arm   -    -    Upper arm             Labs/Imaging Results for orders placed or performed during the hospital encounter of 10/03/18 (from the past 48 hour(s))  CBG monitoring, ED     Status: Abnormal   Collection Time: 10/03/18 11:22 PM  Result Value Ref Range   Glucose-Capillary 186 (H) 70 - 99 mg/dL  I-stat troponin, ED     Status: None   Collection Time: 10/03/18 11:32 PM  Result Value Ref Range   Troponin i, poc 0.03 0.00 - 0.08 ng/mL   Comment 3            Comment: Due to the release kinetics of cTnI, a negative result within the first hours of the onset of symptoms does not rule out myocardial infarction with certainty. If myocardial infarction is still suspected, repeat the test at  appropriate intervals.   I-Stat CG4 Lactic Acid, ED     Status: Abnormal   Collection Time: 10/03/18 11:34 PM  Result Value Ref Range   Lactic Acid, Venous 2.71 (HH) 0.5 - 1.9 mmol/L   Comment NOTIFIED PHYSICIAN   CBC with Differential     Status: Abnormal   Collection Time: 10/03/18 11:42 PM  Result Value Ref Range   WBC 11.8 (H) 4.0 - 10.5 K/uL   RBC 4.34 4.22 - 5.81 MIL/uL  Hemoglobin 12.4 (L) 13.0 - 17.0 g/dL   HCT 41.5 39.0 - 52.0 %   MCV 95.6 80.0 - 100.0 fL   MCH 28.6 26.0 - 34.0 pg   MCHC 29.9 (L) 30.0 - 36.0 g/dL   RDW 17.9 (H) 11.5 - 15.5 %   Platelets 263 150 - 400 K/uL   nRBC 0.0 0.0 - 0.2 %   Neutrophils Relative % 82 %   Neutro Abs 9.7 (H) 1.7 - 7.7 K/uL   Lymphocytes Relative 6 %   Lymphs Abs 0.7 0.7 - 4.0 K/uL   Monocytes Relative 11 %   Monocytes Absolute 1.3 (H) 0.1 - 1.0 K/uL   Eosinophils Relative 0 %   Eosinophils Absolute 0.0 0.0 - 0.5 K/uL   Basophils Relative 0 %   Basophils Absolute 0.0 0.0 - 0.1 K/uL   Immature Granulocytes 1 %   Abs Immature Granulocytes 0.09 (H) 0.00 - 0.07 K/uL    Comment: Performed at [facility] [address] [place], [place] [address]  CK     Status: None   Collection Time: 10/03/18 11:42 PM  Result Value Ref Range   Total CK 347 49 - 397 U/L    Comment: Performed at [facility], [place] [address], [place], [place] [address]  Magnesium     Status: None   Collection Time: 10/03/18 11:42 PM  Result Value Ref Range   Magnesium 2.0 1.7 - 2.4 mg/dL    Comment: Performed at [facility], [place] [address] [place], [place] [address]  Comprehensive metabolic panel     Status: Abnormal   Collection Time: 10/03/18 11:43 PM  Result Value Ref Range   Sodium 140 135 - 145 mmol/L   Potassium 5.0 3.5 - 5.1 mmol/L   Chloride 100 98 - 111 mmol/L   CO2 25 22 - 32 mmol/L   Glucose, Bld 190 (H) 70 - 99 mg/dL   BUN 43 (H) 8 - 23 mg/dL   Creatinine, Ser 9.70 (H) 0.61 - 1.24 mg/dL   Calcium 9.1 8.9 - 10.3 mg/dL   Total  Protein 7.4 6.5 - 8.1 g/dL   Albumin 3.1 (L) 3.5 - 5.0 g/dL   AST 471 (H) 15 - 41 U/L   ALT 439 (H) 0 - 44 U/L   Alkaline Phosphatase 235 (H) 38 - 126 U/L   Total Bilirubin 2.1 (H) 0.3 - 1.2 mg/dL   GFR calc non Af Amer 5 (L) >60 mL/min   GFR calc Af Amer 6 (L) >60 mL/min   Anion gap 15 5 - 15    Comment: Performed at [facility], [place] [address], [place], [place] [address]  Culture, blood (routine x 2)     Status: None (Preliminary result)   Collection Time: 10/04/18 12:01 AM  Result Value Ref Range   Specimen Description BLOOD LEFT ANTECUBITAL    Special Requests      BOTTLES DRAWN AEROBIC AND ANAEROBIC Blood Culture results may not be optimal due to an inadequate volume of blood received in culture bottles Performed at [facility] [address] [place],  [address]    Culture NO GROWTH < 12 HOURS    Report Status PENDING   Culture, blood (routine x 2)     Status: None (Preliminary result)   Collection Time: 10/04/18 12:07 AM  Result Value Ref Range   Specimen Description BLOOD LEFT FOREARM    Special Requests  BOTTLES DRAWN AEROBIC AND ANAEROBIC Blood Culture results may not be optimal due to an inadequate volume of blood received in culture bottles Performed at [facility], [address], [place], [place] [address]    Culture NO GROWTH < 12 HOURS    Report Status PENDING   D-dimer, quantitative     Status: Abnormal   Collection Time: 10/04/18  1:57 AM  Result Value Ref Range   D-Dimer, Quant 4.52 (H) 0.00 - 0.50 ug/mL-FEU    Comment: (NOTE) At the manufacturer cut-off of 0.50 ug/mL FEU, this assay has been documented to exclude PE with a sensitivity and negative predictive value of 97 to 99%.  At this time, this assay has not been approved by the FDA to exclude DVT/VTE. Results should be correlated with clinical presentation. Performed at [facility], [place] [address] [place], [place] [address]   I-Stat CG4 Lactic Acid, ED      Status: Abnormal   Collection Time: 10/04/18  2:59 AM  Result Value Ref Range   Lactic Acid, Venous 2.03 (HH) 0.5 - 1.9 mmol/L   Comment NOTIFIED PHYSICIAN   Influenza panel by PCR (type A & B)     Status: None   Collection Time: 10/04/18  3:14 AM  Result Value Ref Range   Influenza A By PCR NEGATIVE NEGATIVE   Influenza B By PCR NEGATIVE NEGATIVE    Comment: (NOTE) The Xpert Xpress Flu assay is intended as an aid in the diagnosis of  influenza and should not be used as a sole basis for treatment.  This  assay is FDA approved for nasopharyngeal swab specimens only. Nasal  washings and aspirates are unacceptable for Xpert Xpress Flu testing. Performed at [facility], [place] [address], [place], [place] [address]   Basic metabolic panel     Status: Abnormal   Collection Time: 10/04/18  5:20 AM  Result Value Ref Range   Sodium 138 135 - 145 mmol/L   Potassium 6.1 (H) 3.5 - 5.1 mmol/L   Chloride 98 98 - 111 mmol/L   CO2 26 22 - 32 mmol/L   Glucose, Bld 176 (H) 70 - 99 mg/dL   BUN 47 (H) 8 - 23 mg/dL   Creatinine, Ser 10.43 (H) 0.61 - 1.24 mg/dL   Calcium 9.3 8.9 - 10.3 mg/dL   GFR calc non Af Amer 4 (L) >60 mL/min   GFR calc Af Amer 5 (L) >60 mL/min   Anion gap 14 5 - 15    Comment: Performed at [facility], [place] [address] [place], [place] [address]  CBC     Status: Abnormal   Collection Time: 10/04/18  5:20 AM  Result Value Ref Range   WBC 16.7 (H) 4.0 - 10.5 K/uL   RBC 4.16 (L) 4.22 - 5.81 MIL/uL   Hemoglobin 11.9 (L) 13.0 - 17.0 g/dL   HCT 39.5 39.0 - 52.0 %   MCV 95.0 80.0 - 100.0 fL   MCH 28.6 26.0 - 34.0 pg   MCHC 30.1 30.0 - 36.0 g/dL   RDW 18.1 (H) 11.5 - 15.5 %   Platelets 285 150 - 400 K/uL   nRBC 0.0 0.0 - 0.2 %    Comment: Performed at [facility], [place] [address], [place], [place] [address]  Troponin I - Now Then Q6H     Status: Abnormal   Collection Time: 10/04/18  5:20 AM  Result Value Ref Range   Troponin  I 0.04 (HH) <0.03 ng/mL     Comment: CRITICAL RESULT CALLED TO, READ BACK BY AND VERIFIED WITH: POWELL A,RN 10/04/18 0646 WAYK Performed at [facility] [address] [place], [place] [address]   CBG monitoring, ED     Status: Abnormal   Collection Time: 10/04/18  8:22 AM  Result Value Ref Range   Glucose-Capillary 138 (H) 70 - 99 mg/dL  Troponin I - Now Then Q6H     Status: Abnormal   Collection Time: 10/04/18 11:05 AM  Result Value Ref Range   Troponin I 0.04 (HH) <0.03 ng/mL    Comment: CRITICAL VALUE NOTED.  VALUE IS CONSISTENT WITH PREVIOUSLY REPORTED AND CALLED VALUE. Performed at [facility], [place] [address] [place], [place] [address]   Hemoglobin A1c     Status: Abnormal   Collection Time: 10/04/18 11:05 AM  Result Value Ref Range   Hgb A1c MFr Bld 6.0 (H) 4.8 - 5.6 %    Comment: (NOTE) Pre diabetes:          5.7%-6.4% Diabetes:              >6.4% Glycemic control for   <7.0% adults with diabetes    Mean Plasma Glucose 125.5 mg/dL    Comment: Performed at [facility] [address], [place], [place] [address]  Lipid panel     Status: Abnormal   Collection Time: 10/04/18 11:05 AM  Result Value Ref Range   Cholesterol 171 0 - 200 mg/dL   Triglycerides 185 (H) <150 mg/dL   HDL 66 >40 mg/dL   Total CHOL/HDL Ratio 2.6 RATIO   VLDL 37 0 - 40 mg/dL   LDL Cholesterol 68 0 - 99 mg/dL    Comment:        Total Cholesterol/HDL:CHD Risk Coronary Heart Disease Risk Table                     Men   Women  1/2 Average Risk   3.4   3.3  Average Risk       5.0   4.4  2 X Average Risk   9.6   7.1  3 X Average Risk  23.4   11.0        Use the calculated Patient Ratio above and the CHD Risk Table to determine the patient's CHD Risk.        ATP III CLASSIFICATION (LDL):  <100     mg/dL   Optimal  100-129  mg/dL   Near or Above                    Optimal  130-159  mg/dL   Borderline  160-189  mg/dL   High  >190     mg/dL   Very High Performed at [facility]  [address] [place], [place] [address]    *Note: Due to a large number of results and/or encounters for the requested time period, some results have not been displayed. A complete set of results can be found in Results Review.   Dg Chest 2 View  Result Date: 10/03/2018 CLINICAL DATA:  Altered mental status EXAM: CHEST - 2 VIEW COMPARISON:  08/23/2018 FINDINGS: Mild cardiomegaly. No acute consolidation or effusion. No pneumothorax. Postsurgical changes in the cervical spine. IMPRESSION: No active cardiopulmonary disease.  Cardiomegaly. Electronically Signed   By: Donavan Foil M.D.   On: 10/03/2018 23:59   Ct Head  Wo Contrast  Result Date: 10/04/2018 CLINICAL DATA:  Altered mental status. EXAM: CT HEAD WITHOUT CONTRAST TECHNIQUE: Contiguous axial images were obtained from the base of the skull through the vertex without intravenous contrast. COMPARISON:  CT 08/19/2018 FINDINGS: Brain: Brain volume is normal for age. Mild chronic small vessel ischemia, unchanged from prior. No intracranial hemorrhage, mass effect, or midline shift. No hydrocephalus. The basilar cisterns are patent. No evidence of territorial infarct or acute ischemia. No extra-axial or intracranial fluid collection. Vascular: Atherosclerosis of skullbase vasculature without hyperdense vessel or abnormal calcification. Skull: No fracture or focal lesion. Sinuses/Orbits: No acute finding. Trace mucosal thickening in the sphenoid sinus, unchanged. Other: None. IMPRESSION: No acute intracranial abnormality. Electronically Signed   By: Keith Rake M.D.   On: 10/04/2018 02:39   Ct Angio Chest Pe W Or Wo Contrast  Result Date: 10/04/2018 CLINICAL DATA:  Hypoxia. Altered mental status. Dialysis patient. EXAM: CT ANGIOGRAPHY CHEST WITH CONTRAST TECHNIQUE: Multidetector CT imaging of the chest was performed using the standard protocol during bolus administration of intravenous contrast. Multiplanar CT image reconstructions and MIPs were obtained to  evaluate the vascular anatomy. CONTRAST:  151mL ISOVUE-370 IOPAMIDOL (ISOVUE-370) INJECTION 76% COMPARISON:  Chest radiography 10/03/2018 FINDINGS: Cardiovascular: Pulmonary arterial opacification is good. There are no pulmonary emboli. The heart is enlarged. There is extensive coronary artery calcification. There is aortic atherosclerosis without aneurysm or dissection. Mediastinum/Nodes: No mass or lymphadenopathy. Lungs/Pleura: No pleural effusion. Mild dependent pulmonary atelectasis. No infiltrate or mass. Upper Abdomen: Air in the biliary tree as seen previously. No acute upper abdominal finding. Musculoskeletal: Ordinary thoracic spondylosis, most pronounced at T11-12. Review of the MIP images confirms the above findings. IMPRESSION: 1. No pulmonary emboli or other acute chest pathology. 2. Air in the biliary tree as seen previously. Aortic Atherosclerosis (ICD10-I70.0). Electronically Signed   By: Nelson Chimes M.D.   On: 10/04/2018 07:29   Mr Brain Wo Contrast  Result Date: 10/04/2018 CLINICAL DATA:  Initial evaluation for acute weakness, confusion, altered mental status. EXAM: MRI HEAD WITHOUT CONTRAST TECHNIQUE: Multiplanar, multiecho pulse sequences of the brain and surrounding structures were obtained without intravenous contrast. COMPARISON:  Prior CT from earlier the same day. FINDINGS: Brain: Mild diffuse prominence of the CSF containing spaces compatible with generalized age-related cerebral atrophy. Patchy and confluent T2/FLAIR hyperintensity within the periventricular and deep white matter both cerebral hemispheres most compatible with chronic microvascular ischemic change, moderate nature. Chronic microvascular ischemic disease noted within the pons as well. Subcentimeter remote right cerebellar infarct noted. 6 mm focus of diffusion abnormality within the right cerebellar hemisphere consistent with a small acute ischemic infarct (series 5, image 57). No associated hemorrhage or mass effect.  No other evidence for acute or subacute ischemia. Gray-white matter differentiation otherwise maintained. No evidence for acute or chronic intracranial hemorrhage. No mass lesion, midline shift or mass effect. No hydrocephalus. No extra-axial fluid collection. Pituitary gland within normal limits. Vascular: Major intracranial vascular flow voids are maintained. Skull and upper cervical spine: Craniocervical junction within normal limits. Postsurgical changes noted within the upper cervical spine, partially visualized. No focal marrow replacing lesion. Scalp soft tissues unremarkable. Sinuses/Orbits: Globes and orbital soft tissues within normal limits. Mild chronic mucosal thickening seen throughout the paranasal sinuses. No air-fluid levels to suggest acute sinusitis. Trace opacity bilateral mastoid air cells, of doubtful significance. Inner ear structures grossly normal. Other: None. IMPRESSION: 1. 6 mm acute ischemic nonhemorrhagic right cerebellar infarct. 2. Underlying age-related cerebral atrophy with moderate chronic small vessel ischemic  disease, with additional subcentimeter chronic right cerebellar infarct. Electronically Signed   By: Jeannine Boga M.D.   On: 10/04/2018 06:22   Mr Jodene Nam Head Wo Contrast  Result Date: 10/04/2018 CLINICAL DATA:  Follow-up acute stroke. 6 mm right cerebellar infarction. EXAM: MRA HEAD WITHOUT CONTRAST TECHNIQUE: Angiographic images of the Circle of Willis were obtained using MRA technique without intravenous contrast. COMPARISON:  MRI same day. FINDINGS: Both internal carotid arteries are patent through the skull base and siphon regions. There is atherosclerotic irregularity in the carotid siphon regions but no flow limiting stenosis. The anterior and middle cerebral vessels are patent without proximal stenosis, aneurysm or vascular malformation. More distal branch vessels show some atherosclerotic irregularity. Both vertebral arteries are patent to the basilar. No  basilar stenosis. Posterior circulation branch vessels are patent. Posterior cerebral artery in superior cerebellar artery branches distally show some atherosclerotic irregularity. IMPRESSION: No large or medium vessel occlusion or correctable proximal stenosis. Distal branch vessel atherosclerotic narrowing and irregularity seen throughout, more prominent within the superior cerebellar and posterior cerebral branches. Electronically Signed   By: Nelson Chimes M.D.   On: 10/04/2018 10:22   Ct Renal Stone Study  Result Date: 10/04/2018 CLINICAL DATA:  Right flank pain EXAM: CT ABDOMEN AND PELVIS WITHOUT CONTRAST TECHNIQUE: Multidetector CT imaging of the abdomen and pelvis was performed following the standard protocol without IV contrast. COMPARISON:  08/20/2018 FINDINGS: Lower chest: 4 mm right lower lobe pulmonary nodule on image 7 is stable dating back to 09/27/2011. Hepatobiliary: Postcholecystectomy and pneumobilia are stable. No obvious liver mass. Pancreas: Unremarkable Spleen: Unremarkable Adrenals/Urinary Tract: Adrenal glands are within normal limits. There is advanced atrophy of both kidneys. Several low-density lesions are scattered throughout both kidneys. They are not significantly changed. There is no hydronephrosis. Calcifications within the central kidneys are likely vascular. These are also stable. No definite ureteral calculus or hydroureter. Bladder is decompressed. Stomach/Bowel: Normal appendix. There is no obvious mass in the visualized colon. No disproportionate dilatation of small bowel to suggest obstruction. Nonspecific air-fluid levels in small bowel are noted. Stomach is decompressed. Vascular/Lymphatic: Atherosclerotic calcifications of the aorta and iliac arteries are present. Maximal diameter of the abdominal aorta is 2.6 cm. Reproductive: There surgical clips in the prostate gland. Other: No free fluid. Musculoskeletal: Spinal fusion hardware is in place. There is solid fusion  across the L4-5 disc space. Ray cage is are in place at the L3-4 disc space. There is some bony bridging across the posterior elements at this level. No vertebral compression. IMPRESSION: No evidence of urinary calculus or urinary obstruction. Stable exam. Electronically Signed   By: Marybelle Killings M.D.   On: 10/04/2018 08:57   Vas US Carotid (at [facility] Only)  Result Date: 10/04/2018 Carotid Arterial Duplex Study Indications: CVA. Performing Technologist: June Leap RDMS, RVT  Examination Guidelines: A complete evaluation includes B-mode imaging, spectral Doppler, color Doppler, and power Doppler as needed of all accessible portions of each vessel. Bilateral testing is considered an integral part of a complete examination. Limited examinations for reoccurring indications may be performed as noted.  Right Carotid Findings: +----------+--------+--------+--------+----------+--------+           PSV cm/sEDV cm/sStenosisDescribe  Comments +----------+--------+--------+--------+----------+--------+ CCA Prox  54      11              hypoechoic         +----------+--------+--------+--------+----------+--------+ CCA Distal60      11                                 +----------+--------+--------+--------+----------+--------+  ICA Prox  60      16      1-39%   calcific           +----------+--------+--------+--------+----------+--------+ ICA Distal130     36                                 +----------+--------+--------+--------+----------+--------+ ECA       126     5                                  +----------+--------+--------+--------+----------+--------+ +----------+--------+-------+----------------+-------------------+           PSV cm/sEDV cmsDescribe        Arm Pressure (mmHG) +----------+--------+-------+----------------+-------------------+ [id number]            Multiphasic, WNL                     +----------+--------+-------+----------------+-------------------+ +---------+--------+--+--------+--+---------+ VertebralPSV cm/s60EDV cm/s13Antegrade +---------+--------+--+--------+--+---------+  Left Carotid Findings: +----------+--------+--------+--------+------------+--------+           PSV cm/sEDV cm/sStenosisDescribe    Comments +----------+--------+--------+--------+------------+--------+ CCA Prox  90      20              heterogenous         +----------+--------+--------+--------+------------+--------+ CCA Distal64      12                                   +----------+--------+--------+--------+------------+--------+ ICA Prox  55      17      1-39%   heterogenous         +----------+--------+--------+--------+------------+--------+ ICA Distal114     27                                   +----------+--------+--------+--------+------------+--------+ ECA       82      5                                    +----------+--------+--------+--------+------------+--------+ +----------+--------+--------+----------------+-------------------+ SubclavianPSV cm/sEDV cm/sDescribe        Arm Pressure (mmHG) +----------+--------+--------+----------------+-------------------+           120             Multiphasic, WNL                    +----------+--------+--------+----------------+-------------------+ +---------+--------+--+--------+--+---------+ VertebralPSV cm/s56EDV cm/s19Antegrade +---------+--------+--+--------+--+---------+  Summary: Right Carotid: Velocities in the right ICA are consistent with a 1-39% stenosis. Left Carotid: Velocities in the left ICA are consistent with a 1-39% stenosis.  *See table(s) above for measurements and observations.     Preliminary     Pending Labs Unresulted Labs (From admission, onward)    Start     Ordered   10/05/18 0500  CBC  Daily,   R     10/04/18 1356   10/05/18 4332  Basic metabolic panel  Daily,   R      10/04/18 1356   10/05/18 0500  Hepatic function panel  Tomorrow morning,   R     10/04/18 1357   10/04/18 0458  Troponin I - Now Then Q6H  Now then every 6  hours,   R     10/04/18 0458   10/04/18 0020  Hepatitis panel, acute  ONCE - STAT,   STAT     10/04/18 0019   Signed and Held  CBC  Once,   R     Signed and Held          Vitals/Pain Today's Vitals   10/04/18 1215 10/04/18 1245 10/04/18 1330 10/04/18 1500  BP: 120/65 (!) 143/87 110/75 (!) 155/81  Pulse: 82 77 74 71  Resp: 17 13 13 13   Temp:      TempSrc:      SpO2: 97% 97% 95% 95%  Weight:      Height:      PainSc:        Isolation Precautions Enteric precautions (UV disinfection)  Medications Medications  allopurinol (ZYLOPRIM) tablet 100 mg (has no administration in time range)  colchicine tablet 0.6 mg (0.6 mg Oral Given 10/04/18 1027)  ezetimibe (ZETIA) tablet 10 mg (has no administration in time range)  colesevelam [facility]) tablet 1,875 mg (1,875 mg Oral Given 10/04/18 1334)  citalopram (CELEXA) tablet 10 mg (has no administration in time range)  methimazole (TAPAZOLE) tablet 2.5 mg (2.5 mg Oral Given 10/04/18 1252)  cinacalcet (SENSIPAR) tablet 90 mg (90 mg Oral Given 10/04/18 1333)  ferric citrate (AURYXIA) tablet 420 mg (420 mg Oral Not Given 10/04/18 1339)  polyethylene glycol (MIRALAX / GLYCOLAX) packet 17 g (has no administration in time range)  gabapentin (NEURONTIN) capsule 100 mg (has no administration in time range)  multivitamin (RENA-VIT) tablet 1 tablet (has no administration in time range)  feeding supplement (NEPRO CARB STEADY) liquid 237 mL (has no administration in time range)  acetaminophen (TYLENOL) tablet 650 mg (650 mg Oral Given 10/04/18 1025)    Or  acetaminophen (TYLENOL) suppository 650 mg ( Rectal See Alternative 10/04/18 1025)  ondansetron (ZOFRAN) tablet 4 mg (4 mg Oral Given 10/04/18 0751)    Or  ondansetron (ZOFRAN) injection 4 mg ( Intravenous See Alternative 10/04/18 0751)  insulin aspart  (novoLOG) injection 0-9 Units (0 Units Subcutaneous Not Given 10/04/18 0943)  heparin injection 5,000 Units (5,000 Units Subcutaneous Given 10/04/18 0751)  predniSONE (DELTASONE) tablet 10 mg (10 mg Oral Given 10/04/18 0751)  vancomycin (VANCOCIN) 50 mg/mL oral solution 125 mg (125 mg Oral Given 10/04/18 1252)    Followed by  vancomycin (VANCOCIN) 50 mg/mL oral solution 125 mg (has no administration in time range)  sodium zirconium cyclosilicate (LOKELMA) packet 10 g (has no administration in time range)  Chlorhexidine Gluconate Cloth 2 % PADS 6 each (has no administration in time range)  iopamidol (ISOVUE-370) 76 % injection (100 mLs  Contrast Given 10/04/18 0711)   stroke: mapping our early stages of recovery book (1 each Does not apply Given 10/04/18 0752)    Mobility walks with device

## 2018-10-04 NOTE — H&P (Signed)
History and Physical    [name] ZOX:[id number] DOB: 1951/03/04 DOA: 10/03/2018  PCP: Biagio Borg, MD  Patient coming from: Home.  Chief Complaint: Confusion and chest pain.  HPI: [name] is a 68 y.o. male with ESRD on hemodialysis on Tuesday Thursday Saturday, CAD, hypothyroidism, gout, recently admitted for sepsis and has had multiple episodes of bacteremia and also recently treated for C. difficile still on vancomycin was not feeling well for last couple of days feeling tired and weak.  Last evening patient found it difficult to walk and had to rest on his bed following which patient wife found that patient was very confused and had a bowel movement.  After that patient was difficult to make him walk and EMS was called.  Patient remained confused at least for an hour or 2.  Prior to which patient also had some chest pressure lasted for one half now.  Patient wife states that patient has had left wrist gout attack and has started back on prednisone yesterday which patient took first dose.  ED Course: In the ER patient was back to baseline moving all extremities.  CT head was unremarkable.  EKG was showing normal sinus rhythm.  Patient was afebrile.  Chest x-ray was unremarkable.  On my exam patient is able to move all extremities.  Patient remained hypoxic.  Patient admitted for acute hypoxic respiratory failure with acute encephalopathy brief episode which resolved and chest pain.  Patient is also found to have markedly elevated LFTs which are acute.  Abdomen appears benign.  Denies any nausea vomiting or abdominal pain.  Review of Systems: As per HPI, rest all negative.   Past Medical History:  Diagnosis Date  . Allergic rhinitis, cause unspecified 02/24/2014  . Anemia 06/16/2011  . BENIGN PROSTATIC HYPERTROPHY 10/14/2009  . CAD, NATIVE VESSEL 02/06/2009   a. 06/2007 s/p Taxus DES to the RCA;  b. 08/2016 NSTEMI in setting of SVT/PCI: LM 30ost, LAD 105m (3.0x16 Synergy DES), LCX  31m, OM1 60, RI 40, RCA 70p/m, 65m - not amenable to PCI.  [name] Cervical radiculopathy, chronic 02/23/2016   Right c5-6 by NCS/EMG  . Chronic combined systolic (congestive) and diastolic (congestive) heart failure ([place])    a. 10/2016 Echo: EF 40-45%, Gr2 DD. mildly dil LA.  [name] COLONIC POLYPS, HX OF 10/14/2009  . Dementia ([place]) [id number]  . Depression 09/24/2015  . DIABETES MELLITUS, TYPE II 02/01/2010  . DYSLIPIDEMIA 06/18/2007  . ESRD (end stage renal disease) on dialysis ([place]) 08/04/2010   ESRD due to DM adn HTN, started HD in 2011. As of 2019 has a R arm graft and gets HD on TTS sched  . FOOT PAIN 08/12/2008  . GAIT DISTURBANCE 03/03/2010  . GASTROENTERITIS, VIRAL 10/14/2009  . GERD 06/18/2007  . GOITER, MULTINODULAR 12/26/2007  . GOUT 06/18/2007  . GYNECOMASTIA 07/17/2010  . Hemodialysis access, fistula mature [facility])    Dialysis T-Th-Sa ([place]) Right upper arm fistula  . Hyperlipidemia 10/16/2011  . Hyperparathyroidism, secondary ([place]) 06/16/2011  . HYPERTENSION 06/18/2007  . Hyperthyroidism   . Hypocalcemia 06/07/2010  . Ischemic cardiomyopathy    a. 10/2016 Echo: EF 40-45%.  . Lumbar stenosis with neurogenic claudication   . ONYCHOMYCOSIS, TOENAILS 12/26/2007  . OSA on CPAP 10/16/2011  . Other malaise and fatigue 11/24/2009  . PERIPHERAL NEUROPATHY 06/18/2007  . Prostate cancer ([place])   . PSVT (paroxysmal supraventricular tachycardia) ([place])    a. 95/6213 complicated by NSTEMI;  b. 11/2016 Treated  w/ adenosine in ED;  c. 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  [name] PULMONARY NODULE, RIGHT LOWER LOBE 06/08/2009  . Sleep apnea    cpap machine and o2  . TRANSAMINASES, SERUM, ELEVATED 02/01/2010  . Transfusion history    none recent  . Unspecified hypotension 01/30/2010    Past Surgical History:  Procedure Laterality Date  . A/V SHUNTOGRAM N/A 04/07/2018   Procedure: A/V SHUNTOGRAM;  Surgeon: Waynetta Sandy, MD;  Location: [place] CV LAB;  Service: Cardiovascular;  Laterality: N/A;    . ARTERIOVENOUS GRAFT PLACEMENT Right 2009   forearm/notes 02/01/2011  . AV FISTULA PLACEMENT  11/07/2011   Procedure: INSERTION OF ARTERIOVENOUS (AV) GORE-TEX GRAFT ARM;  Surgeon: Tinnie Gens, MD;  Location: [place];  Service: Vascular;  Laterality: Left;  . BACK SURGERY  1998  . BASCILIC VEIN TRANSPOSITION Right 02/27/2013   Procedure: BASCILIC VEIN TRANSPOSITION;  Surgeon: Mal Misty, MD;  Location: [place];  Service: Vascular;  Laterality: Right;  Right Basilic Vein Transposition   . CARDIAC CATHETERIZATION N/A 08/06/2016   Procedure: Left Heart Cath and Coronary Angiography;  Surgeon: Jolaine Artist, MD;  Location: [place] CV LAB;  Service: Cardiovascular;  Laterality: N/A;  . CARDIAC CATHETERIZATION N/A 08/07/2016   Procedure: Coronary/Graft Atherectomy-CSI LAD;  Surgeon: Peter M Martinique, MD;  Location: [place] CV LAB;  Service: Cardiovascular;  Laterality: N/A;  . CERVICAL SPINE SURGERY  2/09   "to repair nerve problems in my left arm"  . CHOLECYSTECTOMY    . COLONOSCOPY WITH PROPOFOL N/A 04/26/2017   Procedure: COLONOSCOPY WITH PROPOFOL;  Surgeon: Otis Brace, MD;  Location: [place];  Service: Gastroenterology;  Laterality: N/A;  . CORONARY ANGIOPLASTY WITH STENT PLACEMENT  06/11/2008  . CORONARY ANGIOPLASTY WITH STENT PLACEMENT  06/2007   TAXUS stent to RCA/notes 01/31/2011  . ESOPHAGOGASTRODUODENOSCOPY  09/28/2011   Procedure: ESOPHAGOGASTRODUODENOSCOPY (EGD);  Surgeon: Missy Sabins, MD;  Location: [facility] ENDOSCOPY;  Service: Endoscopy;  Laterality: N/A;  . ESOPHAGOGASTRODUODENOSCOPY N/A 04/07/2015   Procedure: ESOPHAGOGASTRODUODENOSCOPY (EGD);  Surgeon: Teena Irani, MD;  Location: [facility];  Service: Endoscopy;  Laterality: N/A;  . ESOPHAGOGASTRODUODENOSCOPY N/A 04/19/2015   Procedure: ESOPHAGOGASTRODUODENOSCOPY (EGD);  Surgeon: Arta Silence, MD;  Location: [facility] ENDOSCOPY;  Service: Endoscopy;  Laterality: N/A;  . ESOPHAGOGASTRODUODENOSCOPY (EGD) WITH PROPOFOL N/A  06/13/2018   Procedure: ESOPHAGOGASTRODUODENOSCOPY (EGD) WITH PROPOFOL;  Surgeon: Wonda Horner, MD;  Location: [facility] ENDOSCOPY;  Service: Endoscopy;  Laterality: N/A;  . FLEXIBLE SIGMOIDOSCOPY N/A 05/21/2017   Procedure: Conni Elliot;  Surgeon: Clarene Essex, MD;  Location: [place];  Service: Endoscopy;  Laterality: N/A;  . FLEXIBLE SIGMOIDOSCOPY Left 07/02/2017   Procedure: FLEXIBLE SIGMOIDOSCOPY;  Surgeon: Laurence Spates, MD;  Location: [place];  Service: Endoscopy;  Laterality: Left;  . FOREIGN BODY REMOVAL  09/2003   via upper endoscopy/notes 02/12/2011  . GIVENS CAPSULE STUDY  09/30/2011   Procedure: GIVENS CAPSULE STUDY;  Surgeon: Jeryl Columbia, MD;  Location: [facility] ENDOSCOPY;  Service: Endoscopy;  Laterality: N/A;  . INSERTION OF DIALYSIS CATHETER Right 2014  . INSERTION OF DIALYSIS CATHETER Left 02/11/2013   Procedure: INSERTION OF DIALYSIS CATHETER;  Surgeon: Conrad Huntsville, MD;  Location: [place];  Service: Vascular;  Laterality: Left;  Ultrasound guided  . LUMBAR LAMINECTOMY/DECOMPRESSION MICRODISCECTOMY Bilateral 07/31/2017   Procedure: LAMINECTOMY AND FORAMINOTOMY- BILATERAL LUMBAR TWO- LUMBAR THREE;  Surgeon: Earnie Larsson, MD;  Location: [place];  Service: Neurosurgery;  Laterality: Bilateral;  LAMINECTOMY AND FORAMINOTOMY- BILATERAL LUMBAR 2- LUMBAR 3  .  PERIPHERAL VASCULAR BALLOON ANGIOPLASTY  04/07/2018   Procedure: PERIPHERAL VASCULAR BALLOON ANGIOPLASTY;  Surgeon: Waynetta Sandy, MD;  Location: [place] CV LAB;  Service: Cardiovascular;;  RUE AVF  . REMOVAL OF A DIALYSIS CATHETER Right 02/11/2013   Procedure: REMOVAL OF A DIALYSIS CATHETER;  Surgeon: Conrad Manhattan, MD;  Location: [place];  Service: Vascular;  Laterality: Right;  . SAVORY DILATION N/A 04/07/2015   Procedure: SAVORY DILATION;  Surgeon: Teena Irani, MD;  Location: [facility] ENDOSCOPY;  Service: Endoscopy;  Laterality: N/A;  . SHUNTOGRAM N/A 09/20/2011   Procedure: Earney Mallet;  Surgeon: Conrad , MD;   Location: [facility] CATH LAB;  Service: Cardiovascular;  Laterality: N/A;  . SVT ABLATION N/A 11/26/2016   Procedure: SVT Ablation;  Surgeon: Will Meredith Leeds, MD;  Location: [place] CV LAB;  Service: Cardiovascular;  Laterality: N/A;  . TEE WITHOUT CARDIOVERSION N/A 08/25/2018   Procedure: TRANSESOPHAGEAL ECHOCARDIOGRAM (TEE);  Surgeon: Dorothy Spark, MD;  Location[facility] ENDOSCOPY;  Service: Cardiovascular;  Laterality: N/A;  . TONSILLECTOMY    . TOTAL KNEE ARTHROPLASTY Right 08/02/2015   Procedure: TOTAL KNEE ARTHROPLASTY;  Surgeon: Renette Butters, MD;  Location: [place];  Service: Orthopedics;  Laterality: Right;  . VENOGRAM N/A 01/26/2013   Procedure: VENOGRAM;  Surgeon: Angelia Mould, MD;  Location: [facility] CATH LAB;  Service: Cardiovascular;  Laterality: N/A;     reports that he quit smoking about 13 years ago. His smoking use included cigarettes. He has a 25.00 pack-year smoking history. He quit smokeless tobacco use about 13 years ago. He reports that he does not drink alcohol or use drugs.  Allergies  Allergen Reactions  . Cephalexin Swelling and Other (See Comments)    Tolerated Rocephin 10/19. Tongue swelling, but no breathing issues Tolerated Zosyn in Sept 2019 without any issues  . Statins Other (See Comments)    Weak muscles  . Ciprofloxacin Rash    Family History  Problem Relation Age of Onset  . Heart disease Sister   . Thyroid nodules Sister   . Heart disease Father   . Diabetes Father   . Kidney failure Father   . Hypertension Father   . Healthy Child   . Healthy Child   . Healthy Child   . Cancer Neg Hx     Prior to Admission medications   Medication Sig Start Date End Date Taking? Authorizing Provider  acetaminophen (TYLENOL) 325 MG tablet Take 650 mg by mouth every 6 (six) hours as needed for mild pain or headache.    Yes [provider]  Alirocumab (PRALUENT) 150 MG/ML SOPN Inject 150 mg into the skin every 14 (fourteen) days. 03/05/18  Yes  Lorretta Harp, MD  allopurinol (ZYLOPRIM) 100 MG tablet TAKE ONE TABLET BY MOUTH ONCE DAILY Patient taking differently: Take 100 mg by mouth daily.  10/14/17  Yes Biagio Borg, MD  aspirin EC 81 MG tablet Take 81 mg by mouth daily.   Yes [provider]  betamethasone dipropionate (DIPROLENE) 0.05 % cream Apply 1 application topically daily as needed (leg sores).   Yes [provider]  citalopram (CELEXA) 10 MG tablet Take 1 tablet (10 mg total) by mouth at bedtime. 10/14/17  Yes Biagio Borg, MD  colchicine 0.6 MG tablet Take 1 tablet (0.6 mg total) by mouth daily. 08/08/18  Yes Biagio Borg, MD  doxercalciferol ([name]) 4 MCG/2ML injection Inject 1 mL (2 mcg total) into the vein Every Tuesday,Thursday,and Saturday with dialysis. 09/02/18  Yes Irene Pap N, DO  ezetimibe (ZETIA) 10 MG tablet TAKE ONE TABLET BY MOUTH ONCE DAILY Patient taking differently: Take 10 mg by mouth daily.  04/08/17  Yes Bensimhon, Shaune Pascal, MD  ferric citrate (AURYXIA) 1 GM 210 MG(Fe) tablet Take 2 tablets (420 mg total) by mouth 3 (three) times daily with meals. 08/30/18  Yes Irene Pap N, DO  gabapentin (NEURONTIN) 100 MG capsule Take 1 capsule (100 mg total) by mouth at bedtime. 08/31/18  Yes Kayleen Memos, DO  hydroxypropyl methylcellulose / hypromellose (ISOPTO TEARS / GONIOVISC) 2.5 % ophthalmic solution Place 1 drop into both eyes 3 (three) times daily as needed for dry eyes. 05/03/17  Yes Biagio Borg, MD  methimazole (TAPAZOLE) 5 MG tablet Take 0.5 tablets (2.5 mg total) by mouth daily. 05/16/18  Yes Renato Shin, MD  multivitamin (RENA-VIT) TABS tablet Take 1 tablet by mouth daily. Reported on 10/24/2015   Yes [provider]  Nutritional Supplements (FEEDING SUPPLEMENT, NEPRO CARB STEADY,) LIQD Take 237 mLs by mouth 2 (two) times daily between meals. 08/30/18  Yes Hall, Carole N, DO  polyethylene glycol (MIRALAX / GLYCOLAX) packet Take 17 g by mouth daily. 05/23/17  Yes Florencia Reasons, MD   SENSIPAR 90 MG tablet Take 90 mg by mouth 2 (two) times daily. 09/18/16  Yes [provider]  vancomycin (VANCOCIN) 125 MG capsule Take 125 mg by mouth every other day. Starting 09/19/18 take 1 tablet every other day for 8 days. Started 10/07/2018 take 1 tablet every three days for 2 weeks.   Yes [provider]  WELCHOL 625 MG tablet TAKE THREE TABLETS BY MOUTH TWICE DAILY WITH MEALS Patient taking differently: Take 1,875 mg by mouth 2 (two) times daily with a meal.  10/04/17  Yes Biagio Borg, MD  zolpidem (AMBIEN) 5 MG tablet Take 1 tablet (5 mg total) by mouth at bedtime as needed. for sleep Patient taking differently: Take 5 mg by mouth at bedtime as needed for sleep.  06/20/18  Yes Biagio Borg, MD    Physical Exam: Vitals:   10/04/18 0200 10/04/18 0245 10/04/18 0330 10/04/18 0400  BP: 101/69 101/63 103/64 109/69  Pulse: 91 91 85 87  Resp: 15 15 14 19   Temp:      TempSrc:      SpO2: 98% 95% 97% 97%  Weight:      Height:          Constitutional: Moderately built and nourished. Vitals:   10/04/18 0200 10/04/18 0245 10/04/18 0330 10/04/18 0400  BP: 101/69 101/63 103/64 109/69  Pulse: 91 91 85 87  Resp: 15 15 14 19   Temp:      TempSrc:      SpO2: 98% 95% 97% 97%  Weight:      Height:       Eyes: Nonicteric no pallor. ENMT: No discharge from the ears eyes nose or mouth. Neck: No mass or.  No neck rigidity. Respiratory: No rhonchi or crepitations. Cardiovascular: S1-S2 heard. Abdomen: Soft nontender bowel sounds present. Musculoskeletal: Left wrist mild swelling. Skin: No rash. Neurologic: Alert awake oriented to time place and person.  Moves all extremities 5 x 5.  No facial asymmetry tongue is midline.  Pupils equal and reacting to light. Psychiatric: Appears normal.   Labs on Admission: I have personally reviewed following labs and imaging studies  CBC: Recent Labs  Lab 10/03/18 2342  WBC 11.8*  NEUTROABS 9.7*  HGB 12.4*  HCT 41.5  MCV  95.6  PLT 694   Basic Metabolic Panel: Recent Labs  Lab 10/03/18 2342 10/03/18 2343  NA  --  140  K  --  5.0  CL  --  100  CO2  --  25  GLUCOSE  --  190*  BUN  --  43*  CREATININE  --  9.70*  CALCIUM  --  9.1  MG 2.0  --    GFR: Estimated Creatinine Clearance: 8.9 mL/min (A) (by C-G formula based on SCr of 9.7 mg/dL (H)). Liver Function Tests: Recent Labs  Lab 10/03/18 2343  AST 471*  ALT 439*  ALKPHOS 235*  BILITOT 2.1*  PROT 7.4  ALBUMIN 3.1*   No results for input(s): LIPASE, AMYLASE in the last 168 hours. No results for input(s): AMMONIA in the last 168 hours. Coagulation Profile: No results for input(s): INR, PROTIME in the last 168 hours. Cardiac Enzymes: Recent Labs  Lab 10/03/18 2342  CKTOTAL 347   BNP (last 3 results) No results for input(s): PROBNP in the last 8760 hours. HbA1C: No results for input(s): HGBA1C in the last 72 hours. CBG: Recent Labs  Lab 10/03/18 2322  GLUCAP 186*   Lipid Profile: No results for input(s): CHOL, HDL, LDLCALC, TRIG, CHOLHDL, LDLDIRECT in the last 72 hours. Thyroid Function Tests: No results for input(s): TSH, T4TOTAL, FREET4, T3FREE, THYROIDAB in the last 72 hours. Anemia Panel: No results for input(s): VITAMINB12, FOLATE, FERRITIN, TIBC, IRON, RETICCTPCT in the last 72 hours. Urine analysis:    Component Value Date/Time   COLORURINE LT. YELLOW 06/26/2013 1200   APPEARANCEUR CLEAR 06/26/2013 1200   LABSPEC 1.020 06/26/2013 1200   PHURINE 8.5 06/26/2013 1200   GLUCOSEU 100 06/26/2013 1200   HGBUR MODERATE 06/26/2013 1200   BILIRUBINUR NEGATIVE 06/26/2013 1200   KETONESUR NEGATIVE 06/26/2013 1200   PROTEINUR >300 (A) 01/04/2010 1032   UROBILINOGEN 0.2 06/26/2013 1200   NITRITE NEGATIVE 06/26/2013 1200   LEUKOCYTESUR TRACE 06/26/2013 1200   Sepsis Labs: @LABRCNTIP (procalcitonin:4,lacticidven:4) )No results found for this or any previous visit (from the past 240 hour(s)).   Radiological Exams on  Admission: Dg Chest 2 View  Result Date: 10/03/2018 CLINICAL DATA:  Altered mental status EXAM: CHEST - 2 VIEW COMPARISON:  08/23/2018 FINDINGS: Mild cardiomegaly. No acute consolidation or effusion. No pneumothorax. Postsurgical changes in the cervical spine. IMPRESSION: No active cardiopulmonary disease.  Cardiomegaly. Electronically Signed   By: Donavan Foil M.D.   On: 10/03/2018 23:59   Ct Head Wo Contrast  Result Date: 10/04/2018 CLINICAL DATA:  Altered mental status. EXAM: CT HEAD WITHOUT CONTRAST TECHNIQUE: Contiguous axial images were obtained from the base of the skull through the vertex without intravenous contrast. COMPARISON:  CT 08/19/2018 FINDINGS: Brain: Brain volume is normal for age. Mild chronic small vessel ischemia, unchanged from prior. No intracranial hemorrhage, mass effect, or midline shift. No hydrocephalus. The basilar cisterns are patent. No evidence of territorial infarct or acute ischemia. No extra-axial or intracranial fluid collection. Vascular: Atherosclerosis of skullbase vasculature without hyperdense vessel or abnormal calcification. Skull: No fracture or focal lesion. Sinuses/Orbits: No acute finding. Trace mucosal thickening in the sphenoid sinus, unchanged. Other: None. IMPRESSION: No acute intracranial abnormality. Electronically Signed   By: Keith Rake M.D.   On: 10/04/2018 02:39    EKG: Independently reviewed.  Normal sinus rhythm.  Assessment/Plan Principal Problem:   Acute respiratory failure with hypoxia ([name]) Active Problems:   Thyrotoxicosis   CAD S/P percutaneous coronary angioplasty   OSA (obstructive sleep apnea)  Hyperthyroidism   Chest pain   S/P right total knee replacement   DM (diabetes mellitus) type II controlled with renal manifestation ([name])   PAD (peripheral artery disease) ([name])   ESRD on hemodialysis ([place])   Essential hypertension   Chronic combined systolic (congestive) and diastolic (congestive) heart failure ([name])    Acute encephalopathy    1. Acute respiratory failure with hypoxia -since patient's d-dimer is elevated and I have ordered a CT angiogram of the chest to rule out PE.  Place patient on CPAP given history of sleep apnea.  Consult nephrology for dialysis.  Patient does not look to be in obvious fluid overload. 2. Acute encephalopathy -patient had a brief episode of encephalopathy lasted for an hour or 2.  Given the symptoms will check MRI brain to rule out stroke if patient is ruled in for stroke will need further stroke work-up. 3. Chest pain with history of CAD -we will cycle cardiac markers.  Patient is on aspirin Zetia.  Given the elevated d-dimer will check CT angiogram of the chest. 4. Elevated LFTs -check acute hepatitis panel.  Check sonogram of the abdomen.  Denies any abdominal pain nausea or vomiting. 5. Sleep apnea on CPAP. 6. Hypothyroidism on Tapazole. 7. C. difficile on vancomycin. 8. Anemia likely from renal disease.  Follow CBC. 9. Diabetes mellitus type 2 not on any medication we will keep patient on sliding scale coverage. 10. Gout of the left wrist on prednisone. 11. Recent admission for sepsis.   DVT prophylaxis: Heparin. Code Status: Full code. Family Communication: Patient's wife. Disposition Plan: To be determined. Consults called: Neurology. Admission status: Inpatient.   Rise Patience MD [facility] Pager [phone].  If 7PM-7AM, please contact night-coverage [email or web address] Password [email or web address]  10/04/2018, 4:59 AM

## 2018-10-05 ENCOUNTER — Other Ambulatory Visit: Payer: Self-pay

## 2018-10-05 ENCOUNTER — Inpatient Hospital Stay (HOSPITAL_COMMUNITY): Payer: Medicare Other

## 2018-10-05 DIAGNOSIS — I63541 Cerebral infarction due to unspecified occlusion or stenosis of right cerebellar artery: Secondary | ICD-10-CM

## 2018-10-05 DIAGNOSIS — W19XXXA Unspecified fall, initial encounter: Secondary | ICD-10-CM

## 2018-10-05 DIAGNOSIS — N186 End stage renal disease: Secondary | ICD-10-CM

## 2018-10-05 DIAGNOSIS — R7881 Bacteremia: Principal | ICD-10-CM

## 2018-10-05 DIAGNOSIS — I6340 Cerebral infarction due to embolism of unspecified cerebral artery: Secondary | ICD-10-CM

## 2018-10-05 DIAGNOSIS — E1122 Type 2 diabetes mellitus with diabetic chronic kidney disease: Secondary | ICD-10-CM

## 2018-10-05 DIAGNOSIS — A0472 Enterocolitis due to Clostridium difficile, not specified as recurrent: Secondary | ICD-10-CM

## 2018-10-05 DIAGNOSIS — J9601 Acute respiratory failure with hypoxia: Secondary | ICD-10-CM

## 2018-10-05 DIAGNOSIS — S91312A Laceration without foreign body, left foot, initial encounter: Secondary | ICD-10-CM

## 2018-10-05 DIAGNOSIS — M109 Gout, unspecified: Secondary | ICD-10-CM

## 2018-10-05 DIAGNOSIS — Z8719 Personal history of other diseases of the digestive system: Secondary | ICD-10-CM

## 2018-10-05 DIAGNOSIS — Z992 Dependence on renal dialysis: Secondary | ICD-10-CM

## 2018-10-05 DIAGNOSIS — Z981 Arthrodesis status: Secondary | ICD-10-CM

## 2018-10-05 DIAGNOSIS — B961 Klebsiella pneumoniae [K. pneumoniae] as the cause of diseases classified elsewhere: Secondary | ICD-10-CM

## 2018-10-05 DIAGNOSIS — Z87891 Personal history of nicotine dependence: Secondary | ICD-10-CM

## 2018-10-05 LAB — CBC
HCT: 38.8 % — ABNORMAL LOW (ref 39.0–52.0)
Hemoglobin: 12 g/dL — ABNORMAL LOW (ref 13.0–17.0)
MCH: 28.8 pg (ref 26.0–34.0)
MCHC: 30.9 g/dL (ref 30.0–36.0)
MCV: 93.3 fL (ref 80.0–100.0)
Platelets: 238 10*3/uL (ref 150–400)
RBC: 4.16 MIL/uL — ABNORMAL LOW (ref 4.22–5.81)
RDW: 18.1 % — ABNORMAL HIGH (ref 11.5–15.5)
WBC: 10.9 10*3/uL — ABNORMAL HIGH (ref 4.0–10.5)
nRBC: 0 % (ref 0.0–0.2)

## 2018-10-05 LAB — GLUCOSE, CAPILLARY
GLUCOSE-CAPILLARY: 194 mg/dL — AB (ref 70–99)
Glucose-Capillary: 115 mg/dL — ABNORMAL HIGH (ref 70–99)
Glucose-Capillary: 120 mg/dL — ABNORMAL HIGH (ref 70–99)
Glucose-Capillary: 123 mg/dL — ABNORMAL HIGH (ref 70–99)
Glucose-Capillary: 154 mg/dL — ABNORMAL HIGH (ref 70–99)

## 2018-10-05 LAB — BASIC METABOLIC PANEL
Anion gap: 16 — ABNORMAL HIGH (ref 5–15)
BUN: 28 mg/dL — ABNORMAL HIGH (ref 8–23)
CO2: 26 mmol/L (ref 22–32)
Calcium: 9 mg/dL (ref 8.9–10.3)
Chloride: 94 mmol/L — ABNORMAL LOW (ref 98–111)
Creatinine, Ser: 6.8 mg/dL — ABNORMAL HIGH (ref 0.61–1.24)
GFR calc Af Amer: 9 mL/min — ABNORMAL LOW (ref >60)
GFR calc non Af Amer: 8 mL/min — ABNORMAL LOW (ref >60)
GLUCOSE: 146 mg/dL — AB (ref 70–99)
Potassium: 5.8 mmol/L — ABNORMAL HIGH (ref 3.5–5.1)
Sodium: 136 mmol/L (ref 135–145)

## 2018-10-05 LAB — HEPATIC FUNCTION PANEL
ALT: 272 U/L — ABNORMAL HIGH (ref 0–44)
AST: 156 U/L — ABNORMAL HIGH (ref 15–41)
Albumin: 3 g/dL — ABNORMAL LOW (ref 3.5–5.0)
Alkaline Phosphatase: 222 U/L — ABNORMAL HIGH (ref 38–126)
BILIRUBIN DIRECT: 0.6 mg/dL — AB (ref 0.0–0.2)
Indirect Bilirubin: 1 mg/dL — ABNORMAL HIGH (ref 0.3–0.9)
Total Bilirubin: 1.6 mg/dL — ABNORMAL HIGH (ref 0.3–1.2)
Total Protein: 7.5 g/dL (ref 6.5–8.1)

## 2018-10-05 LAB — ECHOCARDIOGRAM COMPLETE
Height: 73 in
WEIGHTICAEL: 3345.6 [oz_av]

## 2018-10-05 MED ORDER — ASPIRIN 325 MG PO TABS
325.0000 mg | ORAL_TABLET | Freq: Every day | ORAL | Status: DC
  Administered 2018-10-05 – 2018-10-09 (×4): 325 mg via ORAL
  Filled 2018-10-05 (×4): qty 1

## 2018-10-05 NOTE — Progress Notes (Addendum)
STROKE TEAM PROGRESS NOTE  [name] is an 68 y.o. male  With PMH peripheral neuropathy, OSA ( CPAP at night), HTN,hyperthyroidism, CAD,, combine systolic and diastolic HF, DM 2, ESRD ( on hemodialysis) who presented to [facility] ed with c/o AMS. Neurology consulted for stroke seen on MRI.  Date last known well: Unable to determine Time last known well: Unable to determine tPA Given: No: outside of window  Modified Rankin: Rankin Score=1 NIHSS:0  INTERVAL HISTORY He is alone at bedside today. We have overviewed his stroke that was essentially found with unknown etiology. His symptoms do not correlate with his stroke which was found incidentally with workup for AMS more than likely correlated with metabolic encephalopathy.  He states that he had episode of passing out and has had similar episodes before but no satisfactory explanation has been found.  He appears slightly upset "wanting to get to the bottom of his stroke" etiology and reasoning for these symptoms and his recurrent presentation to the hospital.   Vitals:   10/05/18 0139 10/05/18 0500 10/05/18 0818 10/05/18 1221  BP:  118/69 99/74 99/63   Pulse:  83 86 86  Resp: 18 18 20 18   Temp:  98.1 F (36.7 C) 98.8 F (37.1 C) 98.1 F (36.7 C)  TempSrc:  Oral Oral Oral  SpO2: 96% 95% 94% 95%  Weight:  94.8 kg    Height:        CBC:  Recent Labs  Lab 10/03/18 2342 10/04/18 0520 10/05/18 0454  WBC 11.8* 16.7* 10.9*  NEUTROABS 9.7*  --   --   HGB 12.4* 11.9* 12.0*  HCT 41.5 39.5 38.8*  MCV 95.6 95.0 93.3  PLT 263 285 595    Basic Metabolic Panel:  Recent Labs  Lab 10/03/18 2342  10/04/18 0520 10/05/18 0454  NA  --    < > 138 136  K  --    < > 6.1* 5.8*  CL  --    < > 98 94*  CO2  --    < > 26 26  GLUCOSE  --    < > 176* 146*  BUN  --    < > 47* 28*  CREATININE  --    < > 10.43* 6.80*  CALCIUM  --    < > 9.3 9.0  MG 2.0  --   --   --    < > = values in this interval not displayed.   Lipid Panel:     Component  Value Date/Time   CHOL 171 10/04/2018 1105   TRIG 185 (H) 10/04/2018 1105   HDL 66 10/04/2018 1105   CHOLHDL 2.6 10/04/2018 1105   VLDL 37 10/04/2018 1105   LDLCALC 68 10/04/2018 1105   HgbA1c:  Lab Results  Component Value Date   HGBA1C 6.0 (H) 10/04/2018   Urine Drug Screen: No results found for: LABOPIA, COCAINSCRNUR, LABBENZ, AMPHETMU, THCU, LABBARB  Alcohol Level No results found for: [facility]  IMAGING Dg Chest 2 View  Result Date: 10/03/2018 CLINICAL DATA:  Altered mental status EXAM: CHEST - 2 VIEW COMPARISON:  08/23/2018 FINDINGS: Mild cardiomegaly. No acute consolidation or effusion. No pneumothorax. Postsurgical changes in the cervical spine. IMPRESSION: No active cardiopulmonary disease.  Cardiomegaly. Electronically Signed   By: Donavan Foil M.D.   On: 10/03/2018 23:59   Ct Head Wo Contrast  Result Date: 10/04/2018 CLINICAL DATA:  Altered mental status. EXAM: CT HEAD WITHOUT CONTRAST TECHNIQUE: Contiguous axial images were obtained from the base  of the skull through the vertex without intravenous contrast. COMPARISON:  CT 08/19/2018 FINDINGS: Brain: Brain volume is normal for age. Mild chronic small vessel ischemia, unchanged from prior. No intracranial hemorrhage, mass effect, or midline shift. No hydrocephalus. The basilar cisterns are patent. No evidence of territorial infarct or acute ischemia. No extra-axial or intracranial fluid collection. Vascular: Atherosclerosis of skullbase vasculature without hyperdense vessel or abnormal calcification. Skull: No fracture or focal lesion. Sinuses/Orbits: No acute finding. Trace mucosal thickening in the sphenoid sinus, unchanged. Other: None. IMPRESSION: No acute intracranial abnormality. Electronically Signed   By: Keith Rake M.D.   On: 10/04/2018 02:39   Ct Angio Chest Pe W Or Wo Contrast  Result Date: 10/04/2018 CLINICAL DATA:  Hypoxia. Altered mental status. Dialysis patient. EXAM: CT ANGIOGRAPHY CHEST WITH CONTRAST TECHNIQUE:  Multidetector CT imaging of the chest was performed using the standard protocol during bolus administration of intravenous contrast. Multiplanar CT image reconstructions and MIPs were obtained to evaluate the vascular anatomy. CONTRAST:  138mL ISOVUE-370 IOPAMIDOL (ISOVUE-370) INJECTION 76% COMPARISON:  Chest radiography 10/03/2018 FINDINGS: Cardiovascular: Pulmonary arterial opacification is good. There are no pulmonary emboli. The heart is enlarged. There is extensive coronary artery calcification. There is aortic atherosclerosis without aneurysm or dissection. Mediastinum/Nodes: No mass or lymphadenopathy. Lungs/Pleura: No pleural effusion. Mild dependent pulmonary atelectasis. No infiltrate or mass. Upper Abdomen: Air in the biliary tree as seen previously. No acute upper abdominal finding. Musculoskeletal: Ordinary thoracic spondylosis, most pronounced at T11-12. Review of the MIP images confirms the above findings. IMPRESSION: 1. No pulmonary emboli or other acute chest pathology. 2. Air in the biliary tree as seen previously. Aortic Atherosclerosis (ICD10-I70.0). Electronically Signed   By: Nelson Chimes M.D.   On: 10/04/2018 07:29   Mr Brain Wo Contrast  Result Date: 10/04/2018 CLINICAL DATA:  Initial evaluation for acute weakness, confusion, altered mental status. EXAM: MRI HEAD WITHOUT CONTRAST TECHNIQUE: Multiplanar, multiecho pulse sequences of the brain and surrounding structures were obtained without intravenous contrast. COMPARISON:  Prior CT from earlier the same day. FINDINGS: Brain: Mild diffuse prominence of the CSF containing spaces compatible with generalized age-related cerebral atrophy. Patchy and confluent T2/FLAIR hyperintensity within the periventricular and deep white matter both cerebral hemispheres most compatible with chronic microvascular ischemic change, moderate nature. Chronic microvascular ischemic disease noted within the pons as well. Subcentimeter remote right cerebellar  infarct noted. 6 mm focus of diffusion abnormality within the right cerebellar hemisphere consistent with a small acute ischemic infarct (series 5, image 57). No associated hemorrhage or mass effect. No other evidence for acute or subacute ischemia. Gray-white matter differentiation otherwise maintained. No evidence for acute or chronic intracranial hemorrhage. No mass lesion, midline shift or mass effect. No hydrocephalus. No extra-axial fluid collection. Pituitary gland within normal limits. Vascular: Major intracranial vascular flow voids are maintained. Skull and upper cervical spine: Craniocervical junction within normal limits. Postsurgical changes noted within the upper cervical spine, partially visualized. No focal marrow replacing lesion. Scalp soft tissues unremarkable. Sinuses/Orbits: Globes and orbital soft tissues within normal limits. Mild chronic mucosal thickening seen throughout the paranasal sinuses. No air-fluid levels to suggest acute sinusitis. Trace opacity bilateral mastoid air cells, of doubtful significance. Inner ear structures grossly normal. Other: None. IMPRESSION: 1. 6 mm acute ischemic nonhemorrhagic right cerebellar infarct. 2. Underlying age-related cerebral atrophy with moderate chronic small vessel ischemic disease, with additional subcentimeter chronic right cerebellar infarct. Electronically Signed   By: Jeannine Boga M.D.   On: 10/04/2018 06:22   Mr Jodene Nam  Head Wo Contrast  Result Date: 10/04/2018 CLINICAL DATA:  Follow-up acute stroke. 6 mm right cerebellar infarction. EXAM: MRA HEAD WITHOUT CONTRAST TECHNIQUE: Angiographic images of the Circle of Willis were obtained using MRA technique without intravenous contrast. COMPARISON:  MRI same day. FINDINGS: Both internal carotid arteries are patent through the skull base and siphon regions. There is atherosclerotic irregularity in the carotid siphon regions but no flow limiting stenosis. The anterior and middle cerebral  vessels are patent without proximal stenosis, aneurysm or vascular malformation. More distal branch vessels show some atherosclerotic irregularity. Both vertebral arteries are patent to the basilar. No basilar stenosis. Posterior circulation branch vessels are patent. Posterior cerebral artery in superior cerebellar artery branches distally show some atherosclerotic irregularity. IMPRESSION: No large or medium vessel occlusion or correctable proximal stenosis. Distal branch vessel atherosclerotic narrowing and irregularity seen throughout, more prominent within the superior cerebellar and posterior cerebral branches. Electronically Signed   By: Nelson Chimes M.D.   On: 10/04/2018 10:22   [place] Rt Upper Extrem Ltd Soft Tissue Non Vascular  Result Date: 10/05/2018 CLINICAL DATA:  Recurrent bacteremia and history of end-stage renal disease with right upper arm AV fistula. EXAM: ULTRASOUND RIGHT UPPER EXTREMITY LIMITED TECHNIQUE: Ultrasound examination of the upper extremity soft tissues was performed in the area of clinical concern. COMPARISON:  08/20/2018 FINDINGS: Aneurysmal right upper arm AV fistula shows patency and flow. There is a mild amount wall thickening and mild mural thrombus in different portions of the fistula. A segment of a venous outflow stent is also identified near the axilla. No evidence of abnormal fluid collection or focal abscess surrounding the fistula. IMPRESSION: Patent right upper arm AV fistula with some areas of wall thickening and mild nonocclusive mural thrombus. Partial visualization of axillary venous outflow stent. No evidence of abnormal fluid collection or abscess surrounding the fistula. Electronically Signed   By: Aletta Edouard M.D.   On: 10/05/2018 14:36   Ct Renal Stone Study  Result Date: 10/04/2018 CLINICAL DATA:  Right flank pain EXAM: CT ABDOMEN AND PELVIS WITHOUT CONTRAST TECHNIQUE: Multidetector CT imaging of the abdomen and pelvis was performed following the standard  protocol without IV contrast. COMPARISON:  08/20/2018 FINDINGS: Lower chest: 4 mm right lower lobe pulmonary nodule on image 7 is stable dating back to 09/27/2011. Hepatobiliary: Postcholecystectomy and pneumobilia are stable. No obvious liver mass. Pancreas: Unremarkable Spleen: Unremarkable Adrenals/Urinary Tract: Adrenal glands are within normal limits. There is advanced atrophy of both kidneys. Several low-density lesions are scattered throughout both kidneys. They are not significantly changed. There is no hydronephrosis. Calcifications within the central kidneys are likely vascular. These are also stable. No definite ureteral calculus or hydroureter. Bladder is decompressed. Stomach/Bowel: Normal appendix. There is no obvious mass in the visualized colon. No disproportionate dilatation of small bowel to suggest obstruction. Nonspecific air-fluid levels in small bowel are noted. Stomach is decompressed. Vascular/Lymphatic: Atherosclerotic calcifications of the aorta and iliac arteries are present. Maximal diameter of the abdominal aorta is 2.6 cm. Reproductive: There surgical clips in the prostate gland. Other: No free fluid. Musculoskeletal: Spinal fusion hardware is in place. There is solid fusion across the L4-5 disc space. Ray cage is are in place at the L3-4 disc space. There is some bony bridging across the posterior elements at this level. No vertebral compression. IMPRESSION: No evidence of urinary calculus or urinary obstruction. Stable exam. Electronically Signed   By: Marybelle Killings M.D.   On: 10/04/2018 08:57   Vas US Carotid (at [facility] Only)  Result Date: 10/05/2018 Carotid Arterial Duplex Study Indications: CVA. Performing Technologist: June Leap RDMS, RVT  Examination Guidelines: A complete evaluation includes B-mode imaging, spectral Doppler, color Doppler, and power Doppler as needed of all accessible portions of each vessel. Bilateral testing is considered an integral part of a complete  examination. Limited examinations for reoccurring indications may be performed as noted.  Right Carotid Findings: +----------+--------+--------+--------+----------+--------+           PSV cm/sEDV cm/sStenosisDescribe  Comments +----------+--------+--------+--------+----------+--------+ CCA Prox  54      11              hypoechoic         +----------+--------+--------+--------+----------+--------+ CCA Distal60      11                                 +----------+--------+--------+--------+----------+--------+ ICA Prox  60      16      1-39%   calcific           +----------+--------+--------+--------+----------+--------+ ICA Distal130     36                                 +----------+--------+--------+--------+----------+--------+ ECA       126     5                                  +----------+--------+--------+--------+----------+--------+ +----------+--------+-------+----------------+-------------------+           PSV cm/sEDV cmsDescribe        Arm Pressure (mmHG) +----------+--------+-------+----------------+-------------------+ [id number]            Multiphasic, WNL                    +----------+--------+-------+----------------+-------------------+ +---------+--------+--+--------+--+---------+ VertebralPSV cm/s60EDV cm/s13Antegrade +---------+--------+--+--------+--+---------+  Left Carotid Findings: +----------+--------+--------+--------+------------+--------+           PSV cm/sEDV cm/sStenosisDescribe    Comments +----------+--------+--------+--------+------------+--------+ CCA Prox  90      20              heterogenous         +----------+--------+--------+--------+------------+--------+ CCA Distal64      12                                   +----------+--------+--------+--------+------------+--------+ ICA Prox  55      17      1-39%   heterogenous         +----------+--------+--------+--------+------------+--------+  ICA Distal114     27                                   +----------+--------+--------+--------+------------+--------+ ECA       82      5                                    +----------+--------+--------+--------+------------+--------+ +----------+--------+--------+----------------+-------------------+ SubclavianPSV cm/sEDV cm/sDescribe        Arm Pressure (mmHG) +----------+--------+--------+----------------+-------------------+           120  Multiphasic, WNL                    +----------+--------+--------+----------------+-------------------+ +---------+--------+--+--------+--+---------+ VertebralPSV cm/s56EDV cm/s19Antegrade +---------+--------+--+--------+--+---------+  Summary: Right Carotid: Velocities in the right ICA are consistent with a 1-39% stenosis. Left Carotid: Velocities in the left ICA are consistent with a 1-39% stenosis. Vertebrals: Bilateral vertebral arteries demonstrate antegrade flow. *See table(s) above for measurements and observations.  Electronically signed by Antony Contras MD on 10/05/2018 at 2:10:42 PM.    Final    US Abdomen Limited Ruq  Result Date: 10/04/2018 CLINICAL DATA:  History of hepatitis. EXAM: ULTRASOUND ABDOMEN LIMITED RIGHT UPPER QUADRANT COMPARISON:  CT abdomen and pelvis earlier today. FINDINGS: Gallbladder: Removed. Common bile duct: Diameter: 0.5 cm Liver: Pneumobilia and mild intrahepatic biliary ductal prominence in the left hepatic lobe as seen on CT earlier today noted. No focal lesion. Portal vein is patent on color Doppler imaging with normal direction of blood flow towards the liver. IMPRESSION: No acute or focal abnormality. Status post cholecystectomy. Pneumobilia and mild intrahepatic biliary ductal prominence in the left hepatic lobe as seen on CT earlier today. Electronically Signed   By: Inge Rise M.D.   On: 10/04/2018 16:15    PHYSICAL EXAM Pleasant elderly African-American male currently not in  distress.   . Afebrile. Head is nontraumatic. Neck is supple without bruit.    Cardiac exam no murmur or gallop. Lungs are clear to auscultation. Distal pulses are well felt. Neurological Examination Mental Status: Alert, oriented, thought content appropriate. Naming intact. Speech fluent without evidence of aphasia.  Able to follow  commands without difficulty.  Cranial Nerves: II:  Visual fields grossly normal,  III,IV, VI: ptosis not present, extra-ocular motions intact bilaterally, pupils equal, round, reactive to light and accommodation V,VII: smile symmetric, facial light touch sensation normal bilaterally VIII: hearing normal bilaterally IX,X: uvula rises midline XI: bilateral shoulder shrug XII: midline tongue extension Motor: Right :  Upper extremity   5/5              Left:     Upper extremity   4+/5 (no vertical drift)             Lower extremity   5/5                          Lower extremity   4+/5 (no vertical drift) Tone and bulk:normal tone throughout; no atrophy noted Sensory: Pinprick and light touch intact throughout, bilaterally Deep Tendon Reflexes: diminished reflexes throughout Plantars: Right: downgoing                                Left: downgoing Cerebellar: normal finger-to-nose,  normal heel-to-shin test Gait: deferred  ASSESSMENT/PLAN [name] is a 68 y.o. male with history of  presenting with peripheral neuropathy, OSA ( CPAP at night), HTN,hyperthyroidism, CAD,, combine systolic and diastolic HF, DM 2, ESRD ( on hemodialysis) who presented to [facility] ed with c/o AMS.   Found to have an ischemic right cerebellar infarct Likely incidental finding and asymptomatic  Stroke  CT head  No acute intracranial abnormality.  MRI Brain   1. 6 mm acute ischemic nonhemorrhagic right cerebellar infarct. 2. Underlying age-related cerebral atrophy with moderate chronic small vessel ischemic disease, with additional subcentimeter chronic right cerebellar  infarct.  MRA   1. 6 mm acute ischemic nonhemorrhagic right cerebellar infarct. 2. Underlying  age-related cerebral atrophy with moderate chronic small vessel ischemic disease, with additional subcentimeter chronic right cerebellar infarct.  2D Echo   No cardiac source of emboli was indentified.   LDL-C 68  HgbA1c 6.0  SCD for VTE prophylaxis Diet Order            Diet renal with fluid restriction Fluid restriction: 1200 mL Fluid; Room service appropriate? Yes; Fluid consistency: Thin  Diet effective now              aspirin 81 mg daily prior to admission, now on aspirin 325 mg daily.  Essentially goal would be to place patient on dual antiplatelet therapy.  He said he was previously on Plavix, and aspirin and had to stop Plavix as a result of a GI bleed.  Subsequently he was placed on aspirin and has been continuous on 81 mg since.  Therapy recommendations: Recommendations is for dual antiplatelet therapy but patient has declined which is understandable.  We have gust benefits and risks of antiplatelet therapy.  He is okay with remaining on aspirin at an increased dose.  He will continue to monitor for blood in his stool, sputum or overt signs of bleeding. On d/c he will resu,e with 325 mg daily   Disposition:    Hypertension  Stable . Long-term BP goal normotensive 110-140  Hyperlipidemia  Home meds: restart as needed-   LDL controlled, goal < 70, elevated triglycerides 185 currently on Zetia   Diabetes type II  HgbA1c  , goal < 7.0  Uncontrolled  Other Stroke Risk Factors  Advanced age  Obesity, Body mass index is 27.59 kg/m., recommend weight loss, diet and exercise as appropriate    Obstructive sleep apnea, on CPAP at home  Congestive heart failure  Other Active Problems  Renal disease  Hospital day # 1 I have personally obtained history,examined this patient, reviewed notes, independently viewed imaging studies, participated in medical decision  making and plan of care.ROS completed by me personally and pertinent positives fully documented  I have made any additions or clarifications directly to the above note. Agree with note above.  He presented with altered mental status possibly from encephalopathy due to his renal failure but is complaining of syncopal episode and passing out which has happened in the past as well.  MRI shows a tiny right cerebellar infarct related to small vessel disease which is likely clinically asymptomatic.  Patient is refusing Plavix due to prior history of bleeding on it.  Hence continue aspirin.  Check EEG.  Greater than 50% time during this 35-minute visit was spent on counseling and coordination of care about his silent cerebral infarct and discussion about stroke prevention treatment and answering questions.  Antony Contras, MD Medical Director [name] Pager: [phone] 10/05/2018 6:31 PM    To contact Stroke Continuity provider, please refer to [email or web address] After hours, contact General Neurology

## 2018-10-05 NOTE — Progress Notes (Signed)
Enteric precautions removed per hospital policy, patient asymptomatic. Confirmed with infectious disease.

## 2018-10-05 NOTE — Consult Note (Addendum)
[facility]    Date of Admission:  10/03/2018   Total days of antibiotics: 1 ceftriaxone               Reason for Consult: recurrent Klebsiella bacteremia    C diff     Referring Provider: Maylene Roes   Assessment: Recurrent klebsiella bacteremia C diff Gout, L wrist Skin tear  Plan: 1. Continue ceftriaxone 2. Check TEE- his embolic lesion suggests IE 3. Eval of RUE fistula (u/s?) 4. Consider MRI of L wrist 5. Repeat BCx in AM 6. Continue vanco- he is on tapered dose for a severe/recurrent case. Hopefully this will prevent him from relapsing.  7. Small skin tear on L foot after fall. Watch, consider WOC eval?  Thank you so much for this interesting consult,  Principal Problem:   Acute respiratory failure with hypoxia ([place]) Active Problems:   Thyrotoxicosis   CAD S/P percutaneous coronary angioplasty   OSA (obstructive sleep apnea)   Hyperthyroidism   Chest pain   S/P right total knee replacement   DM (diabetes mellitus) type II controlled with renal manifestation ([name])   PAD (peripheral artery disease) ([name])   ESRD on hemodialysis ([place])   Essential hypertension   Chronic combined systolic (congestive) and diastolic (congestive) heart failure ([name])   Acute encephalopathy   Cerebral embolism with cerebral infarction   . allopurinol  100 mg Oral Daily  . Chlorhexidine Gluconate Cloth  6 each Topical Q0600  . cinacalcet  90 mg Oral BID WC  . citalopram  10 mg Oral QHS  . colchicine  0.6 mg Oral Daily  . colesevelam  1,875 mg Oral BID WC  . ezetimibe  10 mg Oral Daily  . feeding supplement (NEPRO CARB STEADY)  237 mL Oral BID BM  . ferric citrate  420 mg Oral TID WC  . gabapentin  100 mg Oral QHS  . heparin  5,000 Units Subcutaneous Q8H  . insulin aspart  0-9 Units Subcutaneous TID WC  . methimazole  2.5 mg Oral Daily  . multivitamin  1 tablet Oral QHS  . polyethylene glycol  17 g Oral Daily  . predniSONE  10 mg Oral BID WC  .  sodium zirconium cyclosilicate  10 g Oral Once  . vancomycin  125 mg Oral Q48H   Followed by  . [START ON 10/08/2018] vancomycin  125 mg Oral Q72H    HPI: [name] is a 68 y.o. male with hx of ESRD, DM2,  previous GI AVM bleeds,spinal hardware, previous Klebsiella bacteremia 11-19 and 10-16. No source was found on his November visit (TEE- and AVF ultrasound -). He was treated with with ceftaz with HD. His course was also complicated by C diff and he was given po vanco (to complete on 12-15). At f/u 12-13 he was still having watery diarrhea and his therapy was prolonged to a prolonged taper of po vanco.  He had f/u BCX at that time that were (-).  He returns to hospital on 1-4 with mental status change and hypoxia. In ED he was afebrile, normal WBC and CXR. He was seen on MRI to have ischemic R cerebllar infarct.   His labs were notable for elevated LFTS (new since 11-20). His CT showed air in biliary tree (old), no chest abnormalities, and no urinary abn. His BCx are 4/4 K pneumoniae.    Review of Systems: Review of Systems  Unable to perform ROS: Acuity of  condition  Constitutional: Negative for chills and fever.  Eyes: Negative for blurred vision and double vision.  Respiratory: Negative for shortness of breath.   Gastrointestinal: Negative for diarrhea.  Genitourinary: Negative for dysuria.       [place]  Musculoskeletal: Positive for joint pain.  He states he still has diarrhea, when I told him Dr Maylene Roes stated he has none, he agrees.  Continued L wrist pain and swelling.  Denies problems with his RUE fistula  Please see HPI. All other systems reviewed and negative.   Past Medical History:  Diagnosis Date  . Allergic rhinitis, cause unspecified 02/24/2014  . Anemia 06/16/2011  . BENIGN PROSTATIC HYPERTROPHY 10/14/2009  . CAD, NATIVE VESSEL 02/06/2009   a. 06/2007 s/p Taxus DES to the RCA;  b. 08/2016 NSTEMI in setting of SVT/PCI: LM 30ost, LAD 64m (3.0x16 Synergy DES), LCX 32m, OM1  60, RI 40, RCA 70p/m, 29m - not amenable to PCI.  [name] Cervical radiculopathy, chronic 02/23/2016   Right c5-6 by NCS/EMG  . Chronic combined systolic (congestive) and diastolic (congestive) heart failure ([place])    a. 10/2016 Echo: EF 40-45%, Gr2 DD. mildly dil LA.  [name] COLONIC POLYPS, HX OF 10/14/2009  . Dementia ([place]) [id number]  . Depression 09/24/2015  . DIABETES MELLITUS, TYPE II 02/01/2010  . DYSLIPIDEMIA 06/18/2007  . ESRD (end stage renal disease) on dialysis ([place]) 08/04/2010   ESRD due to DM adn HTN, started HD in 2011. As of 2019 has a R arm graft and gets HD on TTS sched  . FOOT PAIN 08/12/2008  . GAIT DISTURBANCE 03/03/2010  . GASTROENTERITIS, VIRAL 10/14/2009  . GERD 06/18/2007  . GOITER, MULTINODULAR 12/26/2007  . GOUT 06/18/2007  . GYNECOMASTIA 07/17/2010  . Hemodialysis access, fistula [facility])    Dialysis T-Th-Sa ([place]) Right upper arm fistula  . Hyperlipidemia 10/16/2011  . Hyperparathyroidism, secondary ([place]) 06/16/2011  . HYPERTENSION 06/18/2007  . Hyperthyroidism   . Hypocalcemia 06/07/2010  . Ischemic cardiomyopathy    a. 10/2016 Echo: EF 40-45%.  . Lumbar stenosis with neurogenic claudication   . ONYCHOMYCOSIS, TOENAILS 12/26/2007  . OSA on CPAP 10/16/2011  . Other malaise and fatigue 11/24/2009  . PERIPHERAL NEUROPATHY 06/18/2007  . Prostate cancer ([place])   . PSVT (paroxysmal supraventricular tachycardia) ([place])    a. 25/3664 complicated by NSTEMI;  b. 11/2016 Treated w/ adenosine in ED;  c. 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  [name] PULMONARY NODULE, RIGHT LOWER LOBE 06/08/2009  . Sleep apnea    cpap machine and o2  . TRANSAMINASES, SERUM, ELEVATED 02/01/2010  . Transfusion history    none recent  . Unspecified hypotension 01/30/2010    Social History   Tobacco Use  . Smoking status: Former Smoker    Packs/day: 1.00    Years: 25.00    Pack years: 25.00    Types: Cigarettes    Last attempt to quit: 10/01/2005    Years since quitting: 13.0  . Smokeless tobacco:  Former Systems developer    Quit date: 10/01/2005  . Tobacco comment: Quit smoking 2007 Smoked x 25 years 1/2 ppd.  Substance Use Topics  . Alcohol use: No    Alcohol/week: 0.0 standard drinks  . Drug use: No    Family History  Problem Relation Age of Onset  . Heart disease Sister   . Thyroid nodules Sister   . Heart disease Father   . Diabetes Father   . Kidney failure Father   . Hypertension Father   .  Healthy Child   . Healthy Child   . Healthy Child   . Cancer Neg Hx      Medications:  Scheduled: . allopurinol  100 mg Oral Daily  . Chlorhexidine Gluconate Cloth  6 each Topical Q0600  . cinacalcet  90 mg Oral BID WC  . citalopram  10 mg Oral QHS  . colchicine  0.6 mg Oral Daily  . colesevelam  1,875 mg Oral BID WC  . ezetimibe  10 mg Oral Daily  . feeding supplement (NEPRO CARB STEADY)  237 mL Oral BID BM  . ferric citrate  420 mg Oral TID WC  . gabapentin  100 mg Oral QHS  . heparin  5,000 Units Subcutaneous Q8H  . insulin aspart  0-9 Units Subcutaneous TID WC  . methimazole  2.5 mg Oral Daily  . multivitamin  1 tablet Oral QHS  . polyethylene glycol  17 g Oral Daily  . predniSONE  10 mg Oral BID WC  . sodium zirconium cyclosilicate  10 g Oral Once  . vancomycin  125 mg Oral Q48H   Followed by  . [START ON 10/08/2018] vancomycin  125 mg Oral Q72H    Abtx:  Anti-infectives (From admission, onward)   Start     Dose/Rate Route Frequency Ordered Stop   10/08/18 1000  vancomycin (VANCOCIN) 50 mg/mL oral solution 125 mg     125 mg Oral every 72 hours 10/04/18 0706 10/23/18 0959   10/04/18 2000  cefTRIAXone (ROCEPHIN) 2 g in sodium chloride 0.9 % 100 mL IVPB     2 g 200 mL/hr over 30 Minutes Intravenous Every 24 hours 10/04/18 1918     10/04/18 1000  vancomycin (VANCOCIN) 125 MG capsule 125 mg  Status:  Discontinued    Note to Pharmacy:  Starting 09/19/18 take 1 tablet every other day for 8 days. Started 10/07/2018 take 1 tablet every three days for 2 weeks.     125 mg Oral Every  other day 10/04/18 0458 10/04/18 0706   10/04/18 1000  vancomycin (VANCOCIN) 50 mg/mL oral solution 125 mg     125 mg Oral Every 48 hours 10/04/18 0706 10/08/18 0959        OBJECTIVE: Blood pressure 99/74, pulse 86, temperature 98.8 F (37.1 C), temperature source Oral, resp. rate 20, height 6\' 1"  (1.854 m), weight 94.8 kg, SpO2 94 %.  Physical Exam Constitutional:      General: He is not in acute distress.    Appearance: He is not ill-appearing.  HENT:     Mouth/Throat:     Mouth: Mucous membranes are moist.     Pharynx: No oropharyngeal exudate.  Eyes:     Extraocular Movements: Extraocular movements intact.     Pupils: Pupils are equal, round, and reactive to light.  Neck:     Musculoskeletal: Normal range of motion and neck supple.  Cardiovascular:     Rate and Rhythm: Normal rate and regular rhythm.  Pulmonary:     Effort: Pulmonary effort is normal.     Breath sounds: Normal breath sounds.  Abdominal:     General: Bowel sounds are normal. There is no distension.     Palpations: Abdomen is soft.     Tenderness: There is no abdominal tenderness.  Musculoskeletal:       Arms:  Neurological:     Mental Status: He is alert.     Lab Results Results for orders placed or performed during the hospital encounter of 10/03/18 (from the  past 48 hour(s))  CBG monitoring, ED     Status: Abnormal   Collection Time: 10/03/18 11:22 PM  Result Value Ref Range   Glucose-Capillary 186 (H) 70 - 99 mg/dL  I-stat troponin, ED     Status: None   Collection Time: 10/03/18 11:32 PM  Result Value Ref Range   Troponin i, poc 0.03 0.00 - 0.08 ng/mL   Comment 3            Comment: Due to the release kinetics of cTnI, a negative result within the first hours of the onset of symptoms does not rule out myocardial infarction with certainty. If myocardial infarction is still suspected, repeat the test at appropriate intervals.   I-Stat CG4 Lactic Acid, ED     Status: Abnormal    Collection Time: 10/03/18 11:34 PM  Result Value Ref Range   Lactic Acid, Venous 2.71 (HH) 0.5 - 1.9 mmol/L   Comment NOTIFIED PHYSICIAN   CBC with Differential     Status: Abnormal   Collection Time: 10/03/18 11:42 PM  Result Value Ref Range   WBC 11.8 (H) 4.0 - 10.5 K/uL   RBC 4.34 4.22 - 5.81 MIL/uL   Hemoglobin 12.4 (L) 13.0 - 17.0 g/dL   HCT 41.5 39.0 - 52.0 %   MCV 95.6 80.0 - 100.0 fL   MCH 28.6 26.0 - 34.0 pg   MCHC 29.9 (L) 30.0 - 36.0 g/dL   RDW 17.9 (H) 11.5 - 15.5 %   Platelets 263 150 - 400 K/uL   nRBC 0.0 0.0 - 0.2 %   Neutrophils Relative % 82 %   Neutro Abs 9.7 (H) 1.7 - 7.7 K/uL   Lymphocytes Relative 6 %   Lymphs Abs 0.7 0.7 - 4.0 K/uL   Monocytes Relative 11 %   Monocytes Absolute 1.3 (H) 0.1 - 1.0 K/uL   Eosinophils Relative 0 %   Eosinophils Absolute 0.0 0.0 - 0.5 K/uL   Basophils Relative 0 %   Basophils Absolute 0.0 0.0 - 0.1 K/uL   Immature Granulocytes 1 %   Abs Immature Granulocytes 0.09 (H) 0.00 - 0.07 K/uL    Comment: Performed at [facility], [address], [place], [place] [address]  CK     Status: None   Collection Time: 10/03/18 11:42 PM  Result Value Ref Range   Total CK 347 49 - 397 U/L    Comment: Performed at [facility], [place] [address] [place], [place] [address]  Magnesium     Status: None   Collection Time: 10/03/18 11:42 PM  Result Value Ref Range   Magnesium 2.0 1.7 - 2.4 mg/dL    Comment: Performed at [facility], [place] [address] [place], [place] [address]  Comprehensive metabolic panel     Status: Abnormal   Collection Time: 10/03/18 11:43 PM  Result Value Ref Range   Sodium 140 135 - 145 mmol/L   Potassium 5.0 3.5 - 5.1 mmol/L   Chloride 100 98 - 111 mmol/L   CO2 25 22 - 32 mmol/L   Glucose, Bld 190 (H) 70 - 99 mg/dL   BUN 43 (H) 8 - 23 mg/dL   Creatinine, Ser 9.70 (H) 0.61 - 1.24 mg/dL   Calcium 9.1 8.9 - 10.3 mg/dL   Total Protein 7.4 6.5 - 8.1 g/dL   Albumin 3.1 (L) 3.5 - 5.0 g/dL   AST 471 (H)  15 - 41 U/L   ALT 439 (H) 0 - 44  U/L   Alkaline Phosphatase 235 (H) 38 - 126 U/L   Total Bilirubin 2.1 (H) 0.3 - 1.2 mg/dL   GFR calc non Af Amer 5 (L) >60 mL/min   GFR calc Af Amer 6 (L) >60 mL/min   Anion gap 15 5 - 15    Comment: Performed at [facility] [address] [place], [place] [address]  Culture, blood (routine x 2)     Status: None (Preliminary result)   Collection Time: 10/04/18 12:01 AM  Result Value Ref Range   Specimen Description BLOOD LEFT ANTECUBITAL    Special Requests      BOTTLES DRAWN AEROBIC AND ANAEROBIC Blood Culture results may not be optimal due to an inadequate volume of blood received in culture bottles   Culture  Setup Time      GRAM NEGATIVE RODS IN BOTH AEROBIC AND ANAEROBIC BOTTLES CRITICAL RESULT CALLED TO, READ BACK BY AND VERIFIED WITH: Earnest Rosier [phone] [place] Performed at [facility], [place] [address] [place], [place] [address]    Culture GRAM NEGATIVE RODS    Report Status PENDING   Blood Culture ID Panel (Reflexed)     Status: Abnormal   Collection Time: 10/04/18 12:01 AM  Result Value Ref Range   Enterococcus species NOT DETECTED NOT DETECTED   Listeria monocytogenes NOT DETECTED NOT DETECTED   Staphylococcus species NOT DETECTED NOT DETECTED   Staphylococcus aureus (BCID) NOT DETECTED NOT DETECTED   Streptococcus species NOT DETECTED NOT DETECTED   Streptococcus agalactiae NOT DETECTED NOT DETECTED   Streptococcus pneumoniae NOT DETECTED NOT DETECTED   Streptococcus pyogenes NOT DETECTED NOT DETECTED   Acinetobacter baumannii NOT DETECTED NOT DETECTED   Enterobacteriaceae species DETECTED (A) NOT DETECTED    Comment: Enterobacteriaceae represent a large family of gram-negative bacteria, not a single organism. CRITICAL RESULT CALLED TO, READ BACK BY AND VERIFIED WITH: [place] [phone] FCP    Enterobacter cloacae complex NOT DETECTED NOT DETECTED   Escherichia coli NOT DETECTED NOT DETECTED    Klebsiella oxytoca NOT DETECTED NOT DETECTED   Klebsiella pneumoniae DETECTED (A) NOT DETECTED    Comment: CRITICAL RESULT CALLED TO, READ BACK BY AND VERIFIED WITH: PHARMD ANDREW MEYER [phone] FCP    Proteus species NOT DETECTED NOT DETECTED   Serratia marcescens NOT DETECTED NOT DETECTED   Carbapenem resistance NOT DETECTED NOT DETECTED   Haemophilus influenzae NOT DETECTED NOT DETECTED   Neisseria meningitidis NOT DETECTED NOT DETECTED   Pseudomonas aeruginosa NOT DETECTED NOT DETECTED   Candida albicans NOT DETECTED NOT DETECTED   Candida glabrata NOT DETECTED NOT DETECTED   Candida krusei NOT DETECTED NOT DETECTED   Candida parapsilosis NOT DETECTED NOT DETECTED   Candida tropicalis NOT DETECTED NOT DETECTED  Culture, blood (routine x 2)     Status: Abnormal (Preliminary result)   Collection Time: 10/04/18 12:07 AM  Result Value Ref Range   Specimen Description BLOOD LEFT FOREARM    Special Requests      BOTTLES DRAWN AEROBIC AND ANAEROBIC Blood Culture results may not be optimal due to an inadequate volume of blood received in culture bottles Performed at [facility], [address], [place], [place] [address]    Culture  Setup Time      GRAM NEGATIVE RODS IN BOTH AEROBIC AND ANAEROBIC BOTTLES CRITICAL VALUE NOTED.  VALUE IS CONSISTENT WITH PREVIOUSLY REPORTED AND CALLED VALUE.    Culture KLEBSIELLA PNEUMONIAE (A)  Report Status PENDING   D-dimer, quantitative     Status: Abnormal   Collection Time: 10/04/18  1:57 AM  Result Value Ref Range   D-Dimer, Quant 4.52 (H) 0.00 - 0.50 ug/mL-FEU    Comment: (NOTE) At the manufacturer cut-off of 0.50 ug/mL FEU, this assay has been documented to exclude PE with a sensitivity and negative predictive value of 97 to 99%.  At this time, this assay has not been approved by the FDA to exclude DVT/VTE. Results should be correlated with clinical presentation. Performed at [facility], [place] [address] [place],  [place] [address]   I-Stat CG4 Lactic Acid, ED     Status: Abnormal   Collection Time: 10/04/18  2:59 AM  Result Value Ref Range   Lactic Acid, Venous 2.03 (HH) 0.5 - 1.9 mmol/L   Comment NOTIFIED PHYSICIAN   Influenza panel by PCR (type A & B)     Status: None   Collection Time: 10/04/18  3:14 AM  Result Value Ref Range   Influenza A By PCR NEGATIVE NEGATIVE   Influenza B By PCR NEGATIVE NEGATIVE    Comment: (NOTE) The Xpert Xpress Flu assay is intended as an aid in the diagnosis of  influenza and should not be used as a sole basis for treatment.  This  assay is FDA approved for nasopharyngeal swab specimens only. Nasal  washings and aspirates are unacceptable for Xpert Xpress Flu testing. Performed at [facility], [place] [address] [place], [place] [address]   Basic metabolic panel     Status: Abnormal   Collection Time: 10/04/18  5:20 AM  Result Value Ref Range   Sodium 138 135 - 145 mmol/L   Potassium 6.1 (H) 3.5 - 5.1 mmol/L   Chloride 98 98 - 111 mmol/L   CO2 26 22 - 32 mmol/L   Glucose, Bld 176 (H) 70 - 99 mg/dL   BUN 47 (H) 8 - 23 mg/dL   Creatinine, Ser 10.43 (H) 0.61 - 1.24 mg/dL   Calcium 9.3 8.9 - 10.3 mg/dL   GFR calc non Af Amer 4 (L) >60 mL/min   GFR calc Af Amer 5 (L) >60 mL/min   Anion gap 14 5 - 15    Comment: Performed at [facility], [place] [address], [place], [place] [address]  CBC     Status: Abnormal   Collection Time: 10/04/18  5:20 AM  Result Value Ref Range   WBC 16.7 (H) 4.0 - 10.5 K/uL   RBC 4.16 (L) 4.22 - 5.81 MIL/uL   Hemoglobin 11.9 (L) 13.0 - 17.0 g/dL   HCT 39.5 39.0 - 52.0 %   MCV 95.0 80.0 - 100.0 fL   MCH 28.6 26.0 - 34.0 pg   MCHC 30.1 30.0 - 36.0 g/dL   RDW 18.1 (H) 11.5 - 15.5 %   Platelets 285 150 - 400 K/uL   nRBC 0.0 0.0 - 0.2 %    Comment: Performed at [facility], [place] [address] [place], [place] [address]  Troponin I - Now Then Q6H     Status: Abnormal   Collection Time: 10/04/18  5:20 AM  Result Value Ref Range    Troponin I 0.04 (HH) <0.03 ng/mL    Comment: CRITICAL RESULT CALLED TO, READ BACK BY AND VERIFIED WITH: POWELL A,RN 10/04/18 0646 WAYK Performed at [facility], [place] [address], [place], [place] [address]   CBG monitoring, ED     Status:  Abnormal   Collection Time: 10/04/18  8:22 AM  Result Value Ref Range   Glucose-Capillary 138 (H) 70 - 99 mg/dL  Troponin I - Now Then Q6H     Status: Abnormal   Collection Time: 10/04/18 11:05 AM  Result Value Ref Range   Troponin I 0.04 (HH) <0.03 ng/mL    Comment: CRITICAL VALUE NOTED.  VALUE IS CONSISTENT WITH PREVIOUSLY REPORTED AND CALLED VALUE. Performed at [facility], [place] [address], [place], [place] [address]   Hemoglobin A1c     Status: Abnormal   Collection Time: 10/04/18 11:05 AM  Result Value Ref Range   Hgb A1c MFr Bld 6.0 (H) 4.8 - 5.6 %    Comment: (NOTE) Pre diabetes:          5.7%-6.4% Diabetes:              >6.4% Glycemic control for   <7.0% adults with diabetes    Mean Plasma Glucose 125.5 mg/dL    Comment: Performed at [facility] [address], [place], [place] [address]  Lipid panel     Status: Abnormal   Collection Time: 10/04/18 11:05 AM  Result Value Ref Range   Cholesterol 171 0 - 200 mg/dL   Triglycerides 185 (H) <150 mg/dL   HDL 66 >40 mg/dL   Total CHOL/HDL Ratio 2.6 RATIO   VLDL 37 0 - 40 mg/dL   LDL Cholesterol 68 0 - 99 mg/dL    Comment:        Total Cholesterol/HDL:CHD Risk Coronary Heart Disease Risk Table                     Men   Women  1/2 Average Risk   3.4   3.3  Average Risk       5.0   4.4  2 X Average Risk   9.6   7.1  3 X Average Risk  23.4   11.0        Use the calculated Patient Ratio above and the CHD Risk Table to determine the patient's CHD Risk.        ATP III CLASSIFICATION (LDL):  <100     mg/dL   Optimal  100-129  mg/dL   Near or Above                    Optimal  130-159  mg/dL   Borderline  160-189  mg/dL   High  >190     mg/dL   Very  High Performed at [facility] [address] [place], [place] [address]   Troponin I - Now Then Q6H     Status: Abnormal   Collection Time: 10/04/18  4:50 PM  Result Value Ref Range   Troponin I 0.04 (HH) <0.03 ng/mL    Comment: CRITICAL VALUE NOTED.  VALUE IS CONSISTENT WITH PREVIOUSLY REPORTED AND CALLED VALUE. Performed at [facility], [place] [address] [place],  [address]   Glucose, capillary     Status: Abnormal   Collection Time: 10/04/18  4:55 PM  Result Value Ref Range   Glucose-Capillary 115 (H) 70 - 99 mg/dL   Comment 1 Notify RN    Comment 2 Document in Chart   MRSA PCR Screening     Status: None   Collection Time: 10/04/18  6:06 PM  Result Value Ref Range   MRSA by PCR NEGATIVE NEGATIVE    Comment:  The GeneXpert MRSA Assay (FDA approved for NASAL specimens only), is one component of a comprehensive MRSA colonization surveillance program. It is not intended to diagnose MRSA infection nor to guide or monitor treatment for MRSA infections. Performed at [facility], [place] [address] [place], [place] [address]   Glucose, capillary     Status: Abnormal   Collection Time: 10/05/18 12:05 AM  Result Value Ref Range   Glucose-Capillary 120 (H) 70 - 99 mg/dL  CBC     Status: Abnormal   Collection Time: 10/05/18  4:54 AM  Result Value Ref Range   WBC 10.9 (H) 4.0 - 10.5 K/uL   RBC 4.16 (L) 4.22 - 5.81 MIL/uL   Hemoglobin 12.0 (L) 13.0 - 17.0 g/dL   HCT 38.8 (L) 39.0 - 52.0 %   MCV 93.3 80.0 - 100.0 fL   MCH 28.8 26.0 - 34.0 pg   MCHC 30.9 30.0 - 36.0 g/dL   RDW 18.1 (H) 11.5 - 15.5 %   Platelets 238 150 - 400 K/uL   nRBC 0.0 0.0 - 0.2 %    Comment: Performed at [facility], [place] [address], [place], [place] [address]  Basic metabolic panel     Status: Abnormal   Collection Time: 10/05/18  4:54 AM  Result Value Ref Range   Sodium 136 135 - 145 mmol/L   Potassium 5.8 (H) 3.5 - 5.1 mmol/L    Comment: SLIGHT HEMOLYSIS    Chloride 94 (L) 98 - 111 mmol/L   CO2 26 22 - 32 mmol/L   Glucose, Bld 146 (H) 70 - 99 mg/dL   BUN 28 (H) 8 - 23 mg/dL   Creatinine, Ser 6.80 (H) 0.61 - 1.24 mg/dL    Comment: DELTA CHECK NOTED   Calcium 9.0 8.9 - 10.3 mg/dL   GFR calc non Af Amer 8 (L) >60 mL/min   GFR calc Af Amer 9 (L) >60 mL/min   Anion gap 16 (H) 5 - 15    Comment: Performed at [facility], [place] [address] [place], [place] [address]  Hepatic function panel     Status: Abnormal   Collection Time: 10/05/18  4:54 AM  Result Value Ref Range   Total Protein 7.5 6.5 - 8.1 g/dL   Albumin 3.0 (L) 3.5 - 5.0 g/dL   AST 156 (H) 15 - 41 U/L   ALT 272 (H) 0 - 44 U/L   Alkaline Phosphatase 222 (H) 38 - 126 U/L   Total Bilirubin 1.6 (H) 0.3 - 1.2 mg/dL   Bilirubin, Direct 0.6 (H) 0.0 - 0.2 mg/dL   Indirect Bilirubin 1.0 (H) 0.3 - 0.9 mg/dL    Comment: Performed at [facility] [address], [place], [place] [address]  Glucose, capillary     Status: Abnormal   Collection Time: 10/05/18  8:15 AM  Result Value Ref Range   Glucose-Capillary 115 (H) 70 - 99 mg/dL   Comment 1 Notify RN    Comment 2 Document in Chart    *Note: Due to a large number of results and/or encounters for the requested time period, some results have not been displayed. A complete set of results can be found in Results Review.      Component Value Date/Time   SDES BLOOD LEFT FOREARM 10/04/2018 0007   SPECREQUEST  10/04/2018 0007    BOTTLES DRAWN AEROBIC AND ANAEROBIC Blood Culture results may not be optimal due to an inadequate volume of blood received  in culture bottles Performed at [facility], [place] [address] [place], [place] [address]    CULT KLEBSIELLA PNEUMONIAE (A) 10/04/2018 0007   REPTSTATUS PENDING 10/04/2018 0007   Dg Chest 2 View  Result Date: 10/03/2018 CLINICAL DATA:  Altered mental status EXAM: CHEST - 2 VIEW COMPARISON:  08/23/2018 FINDINGS: Mild cardiomegaly. No acute consolidation or effusion. No  pneumothorax. Postsurgical changes in the cervical spine. IMPRESSION: No active cardiopulmonary disease.  Cardiomegaly. Electronically Signed   By: Donavan Foil M.D.   On: 10/03/2018 23:59   Ct Head Wo Contrast  Result Date: 10/04/2018 CLINICAL DATA:  Altered mental status. EXAM: CT HEAD WITHOUT CONTRAST TECHNIQUE: Contiguous axial images were obtained from the base of the skull through the vertex without intravenous contrast. COMPARISON:  CT 08/19/2018 FINDINGS: Brain: Brain volume is normal for age. Mild chronic small vessel ischemia, unchanged from prior. No intracranial hemorrhage, mass effect, or midline shift. No hydrocephalus. The basilar cisterns are patent. No evidence of territorial infarct or acute ischemia. No extra-axial or intracranial fluid collection. Vascular: Atherosclerosis of skullbase vasculature without hyperdense vessel or abnormal calcification. Skull: No fracture or focal lesion. Sinuses/Orbits: No acute finding. Trace mucosal thickening in the sphenoid sinus, unchanged. Other: None. IMPRESSION: No acute intracranial abnormality. Electronically Signed   By: Keith Rake M.D.   On: 10/04/2018 02:39   Ct Angio Chest Pe W Or Wo Contrast  Result Date: 10/04/2018 CLINICAL DATA:  Hypoxia. Altered mental status. Dialysis patient. EXAM: CT ANGIOGRAPHY CHEST WITH CONTRAST TECHNIQUE: Multidetector CT imaging of the chest was performed using the standard protocol during bolus administration of intravenous contrast. Multiplanar CT image reconstructions and MIPs were obtained to evaluate the vascular anatomy. CONTRAST:  151mL ISOVUE-370 IOPAMIDOL (ISOVUE-370) INJECTION 76% COMPARISON:  Chest radiography 10/03/2018 FINDINGS: Cardiovascular: Pulmonary arterial opacification is good. There are no pulmonary emboli. The heart is enlarged. There is extensive coronary artery calcification. There is aortic atherosclerosis without aneurysm or dissection. Mediastinum/Nodes: No mass or lymphadenopathy.  Lungs/Pleura: No pleural effusion. Mild dependent pulmonary atelectasis. No infiltrate or mass. Upper Abdomen: Air in the biliary tree as seen previously. No acute upper abdominal finding. Musculoskeletal: Ordinary thoracic spondylosis, most pronounced at T11-12. Review of the MIP images confirms the above findings. IMPRESSION: 1. No pulmonary emboli or other acute chest pathology. 2. Air in the biliary tree as seen previously. Aortic Atherosclerosis (ICD10-I70.0). Electronically Signed   By: Nelson Chimes M.D.   On: 10/04/2018 07:29   Mr Brain Wo Contrast  Result Date: 10/04/2018 CLINICAL DATA:  Initial evaluation for acute weakness, confusion, altered mental status. EXAM: MRI HEAD WITHOUT CONTRAST TECHNIQUE: Multiplanar, multiecho pulse sequences of the brain and surrounding structures were obtained without intravenous contrast. COMPARISON:  Prior CT from earlier the same day. FINDINGS: Brain: Mild diffuse prominence of the CSF containing spaces compatible with generalized age-related cerebral atrophy. Patchy and confluent T2/FLAIR hyperintensity within the periventricular and deep white matter both cerebral hemispheres most compatible with chronic microvascular ischemic change, moderate nature. Chronic microvascular ischemic disease noted within the pons as well. Subcentimeter remote right cerebellar infarct noted. 6 mm focus of diffusion abnormality within the right cerebellar hemisphere consistent with a small acute ischemic infarct (series 5, image 57). No associated hemorrhage or mass effect. No other evidence for acute or subacute ischemia. Gray-white matter differentiation otherwise maintained. No evidence for acute or chronic intracranial hemorrhage. No mass lesion, midline shift or mass effect. No hydrocephalus. No extra-axial fluid collection. Pituitary gland within normal limits. Vascular: Major intracranial  vascular flow voids are maintained. Skull and upper cervical spine: Craniocervical junction  within normal limits. Postsurgical changes noted within the upper cervical spine, partially visualized. No focal marrow replacing lesion. Scalp soft tissues unremarkable. Sinuses/Orbits: Globes and orbital soft tissues within normal limits. Mild chronic mucosal thickening seen throughout the paranasal sinuses. No air-fluid levels to suggest acute sinusitis. Trace opacity bilateral mastoid air cells, of doubtful significance. Inner ear structures grossly normal. Other: None. IMPRESSION: 1. 6 mm acute ischemic nonhemorrhagic right cerebellar infarct. 2. Underlying age-related cerebral atrophy with moderate chronic small vessel ischemic disease, with additional subcentimeter chronic right cerebellar infarct. Electronically Signed   By: Jeannine Boga M.D.   On: 10/04/2018 06:22   Mr Jodene Nam Head Wo Contrast  Result Date: 10/04/2018 CLINICAL DATA:  Follow-up acute stroke. 6 mm right cerebellar infarction. EXAM: MRA HEAD WITHOUT CONTRAST TECHNIQUE: Angiographic images of the Circle of Willis were obtained using MRA technique without intravenous contrast. COMPARISON:  MRI same day. FINDINGS: Both internal carotid arteries are patent through the skull base and siphon regions. There is atherosclerotic irregularity in the carotid siphon regions but no flow limiting stenosis. The anterior and middle cerebral vessels are patent without proximal stenosis, aneurysm or vascular malformation. More distal branch vessels show some atherosclerotic irregularity. Both vertebral arteries are patent to the basilar. No basilar stenosis. Posterior circulation branch vessels are patent. Posterior cerebral artery in superior cerebellar artery branches distally show some atherosclerotic irregularity. IMPRESSION: No large or medium vessel occlusion or correctable proximal stenosis. Distal branch vessel atherosclerotic narrowing and irregularity seen throughout, more prominent within the superior cerebellar and posterior cerebral branches.  Electronically Signed   By: Nelson Chimes M.D.   On: 10/04/2018 10:22   Ct Renal Stone Study  Result Date: 10/04/2018 CLINICAL DATA:  Right flank pain EXAM: CT ABDOMEN AND PELVIS WITHOUT CONTRAST TECHNIQUE: Multidetector CT imaging of the abdomen and pelvis was performed following the standard protocol without IV contrast. COMPARISON:  08/20/2018 FINDINGS: Lower chest: 4 mm right lower lobe pulmonary nodule on image 7 is stable dating back to 09/27/2011. Hepatobiliary: Postcholecystectomy and pneumobilia are stable. No obvious liver mass. Pancreas: Unremarkable Spleen: Unremarkable Adrenals/Urinary Tract: Adrenal glands are within normal limits. There is advanced atrophy of both kidneys. Several low-density lesions are scattered throughout both kidneys. They are not significantly changed. There is no hydronephrosis. Calcifications within the central kidneys are likely vascular. These are also stable. No definite ureteral calculus or hydroureter. Bladder is decompressed. Stomach/Bowel: Normal appendix. There is no obvious mass in the visualized colon. No disproportionate dilatation of small bowel to suggest obstruction. Nonspecific air-fluid levels in small bowel are noted. Stomach is decompressed. Vascular/Lymphatic: Atherosclerotic calcifications of the aorta and iliac arteries are present. Maximal diameter of the abdominal aorta is 2.6 cm. Reproductive: There surgical clips in the prostate gland. Other: No free fluid. Musculoskeletal: Spinal fusion hardware is in place. There is solid fusion across the L4-5 disc space. Ray cage is are in place at the L3-4 disc space. There is some bony bridging across the posterior elements at this level. No vertebral compression. IMPRESSION: No evidence of urinary calculus or urinary obstruction. Stable exam. Electronically Signed   By: Marybelle Killings M.D.   On: 10/04/2018 08:57   Vas US Carotid (at [facility] Only)  Result Date: 10/04/2018 Carotid Arterial Duplex Study  Indications: CVA. Performing Technologist: June Leap RDMS, RVT  Examination Guidelines: A complete evaluation includes B-mode imaging, spectral Doppler, color Doppler, and power Doppler as needed of  all accessible portions of each vessel. Bilateral testing is considered an integral part of a complete examination. Limited examinations for reoccurring indications may be performed as noted.  Right Carotid Findings: +----------+--------+--------+--------+----------+--------+           PSV cm/sEDV cm/sStenosisDescribe  Comments +----------+--------+--------+--------+----------+--------+ CCA Prox  54      11              hypoechoic         +----------+--------+--------+--------+----------+--------+ CCA Distal60      11                                 +----------+--------+--------+--------+----------+--------+ ICA Prox  60      16      1-39%   calcific           +----------+--------+--------+--------+----------+--------+ ICA Distal130     36                                 +----------+--------+--------+--------+----------+--------+ ECA       126     5                                  +----------+--------+--------+--------+----------+--------+ +----------+--------+-------+----------------+-------------------+           PSV cm/sEDV cmsDescribe        Arm Pressure (mmHG) +----------+--------+-------+----------------+-------------------+ [id number]            Multiphasic, WNL                    +----------+--------+-------+----------------+-------------------+ +---------+--------+--+--------+--+---------+ VertebralPSV cm/s60EDV cm/s13Antegrade +---------+--------+--+--------+--+---------+  Left Carotid Findings: +----------+--------+--------+--------+------------+--------+           PSV cm/sEDV cm/sStenosisDescribe    Comments +----------+--------+--------+--------+------------+--------+ CCA Prox  90      20              heterogenous          +----------+--------+--------+--------+------------+--------+ CCA Distal64      12                                   +----------+--------+--------+--------+------------+--------+ ICA Prox  55      17      1-39%   heterogenous         +----------+--------+--------+--------+------------+--------+ ICA Distal114     27                                   +----------+--------+--------+--------+------------+--------+ ECA       82      5                                    +----------+--------+--------+--------+------------+--------+ +----------+--------+--------+----------------+-------------------+ SubclavianPSV cm/sEDV cm/sDescribe        Arm Pressure (mmHG) +----------+--------+--------+----------------+-------------------+           120             Multiphasic, WNL                    +----------+--------+--------+----------------+-------------------+ +---------+--------+--+--------+--+---------+ VertebralPSV cm/s56EDV cm/s19Antegrade +---------+--------+--+--------+--+---------+  Summary: Right Carotid: Velocities in the right ICA are consistent with  a 1-39% stenosis. Left Carotid: Velocities in the left ICA are consistent with a 1-39% stenosis.  *See table(s) above for measurements and observations.     Preliminary    US Abdomen Limited Ruq  Result Date: 10/04/2018 CLINICAL DATA:  History of hepatitis. EXAM: ULTRASOUND ABDOMEN LIMITED RIGHT UPPER QUADRANT COMPARISON:  CT abdomen and pelvis earlier today. FINDINGS: Gallbladder: Removed. Common bile duct: Diameter: 0.5 cm Liver: Pneumobilia and mild intrahepatic biliary ductal prominence in the left hepatic lobe as seen on CT earlier today noted. No focal lesion. Portal vein is patent on color Doppler imaging with normal direction of blood flow towards the liver. IMPRESSION: No acute or focal abnormality. Status post cholecystectomy. Pneumobilia and mild intrahepatic biliary ductal prominence in the left hepatic lobe as  seen on CT earlier today. Electronically Signed   By: Inge Rise M.D.   On: 10/04/2018 16:15   Recent Results (from the past 240 hour(s))  Culture, blood (routine x 2)     Status: None (Preliminary result)   Collection Time: 10/04/18 12:01 AM  Result Value Ref Range Status   Specimen Description BLOOD LEFT ANTECUBITAL  Final   Special Requests   Final    BOTTLES DRAWN AEROBIC AND ANAEROBIC Blood Culture results may not be optimal due to an inadequate volume of blood received in culture bottles   Culture  Setup Time   Final    GRAM NEGATIVE RODS IN BOTH AEROBIC AND ANAEROBIC BOTTLES CRITICAL RESULT CALLED TO, READ BACK BY AND VERIFIED WITH: Earnest Rosier [phone] FCP Performed at [facility], [place] [address], [place], [place] [address]    Culture GRAM NEGATIVE RODS  Final   Report Status PENDING  Incomplete  Blood Culture ID Panel (Reflexed)     Status: Abnormal   Collection Time: 10/04/18 12:01 AM  Result Value Ref Range Status   Enterococcus species NOT DETECTED NOT DETECTED Final   Listeria monocytogenes NOT DETECTED NOT DETECTED Final   Staphylococcus species NOT DETECTED NOT DETECTED Final   Staphylococcus aureus (BCID) NOT DETECTED NOT DETECTED Final   Streptococcus species NOT DETECTED NOT DETECTED Final   Streptococcus agalactiae NOT DETECTED NOT DETECTED Final   Streptococcus pneumoniae NOT DETECTED NOT DETECTED Final   Streptococcus pyogenes NOT DETECTED NOT DETECTED Final   Acinetobacter baumannii NOT DETECTED NOT DETECTED Final   Enterobacteriaceae species DETECTED (A) NOT DETECTED Final    Comment: Enterobacteriaceae represent a large family of gram-negative bacteria, not a single organism. CRITICAL RESULT CALLED TO, READ BACK BY AND VERIFIED WITH: [place] [phone] FCP    Enterobacter cloacae complex NOT DETECTED NOT DETECTED Final   Escherichia coli NOT DETECTED NOT DETECTED Final   Klebsiella oxytoca NOT DETECTED NOT DETECTED Final    Klebsiella pneumoniae DETECTED (A) NOT DETECTED Final    Comment: CRITICAL RESULT CALLED TO, READ BACK BY AND VERIFIED WITH: PHARMD ANDREW MEYER [phone] FCP    Proteus species NOT DETECTED NOT DETECTED Final   Serratia marcescens NOT DETECTED NOT DETECTED Final   Carbapenem resistance NOT DETECTED NOT DETECTED Final   Haemophilus influenzae NOT DETECTED NOT DETECTED Final   Neisseria meningitidis NOT DETECTED NOT DETECTED Final   Pseudomonas aeruginosa NOT DETECTED NOT DETECTED Final   Candida albicans NOT DETECTED NOT DETECTED Final   Candida glabrata NOT DETECTED NOT DETECTED Final   Candida krusei NOT DETECTED NOT DETECTED Final   Candida parapsilosis NOT DETECTED NOT DETECTED Final   Candida tropicalis NOT DETECTED  NOT DETECTED Final  Culture, blood (routine x 2)     Status: Abnormal (Preliminary result)   Collection Time: 10/04/18 12:07 AM  Result Value Ref Range Status   Specimen Description BLOOD LEFT FOREARM  Final   Special Requests   Final    BOTTLES DRAWN AEROBIC AND ANAEROBIC Blood Culture results may not be optimal due to an inadequate volume of blood received in culture bottles Performed at [facility], [place] [address] [place], [place] [address]    Culture  Setup Time   Final    GRAM NEGATIVE RODS IN BOTH AEROBIC AND ANAEROBIC BOTTLES CRITICAL VALUE NOTED.  VALUE IS CONSISTENT WITH PREVIOUSLY REPORTED AND CALLED VALUE.    Culture KLEBSIELLA PNEUMONIAE (A)  Final   Report Status PENDING  Incomplete  MRSA PCR Screening     Status: None   Collection Time: 10/04/18  6:06 PM  Result Value Ref Range Status   MRSA by PCR NEGATIVE NEGATIVE Final    Comment:        The GeneXpert MRSA Assay (FDA approved for NASAL specimens only), is one component of a comprehensive MRSA colonization surveillance program. It is not intended to diagnose MRSA infection nor to guide or monitor treatment for MRSA infections. Performed at [facility], [place] [address], [place], [place] [address]     Microbiology: Recent Results (from the past 240 hour(s))  Culture, blood (routine x 2)     Status: None (Preliminary result)   Collection Time: 10/04/18 12:01 AM  Result Value Ref Range Status   Specimen Description BLOOD LEFT ANTECUBITAL  Final   Special Requests   Final    BOTTLES DRAWN AEROBIC AND ANAEROBIC Blood Culture results may not be optimal due to an inadequate volume of blood received in culture bottles   Culture  Setup Time   Final    GRAM NEGATIVE RODS IN BOTH AEROBIC AND ANAEROBIC BOTTLES CRITICAL RESULT CALLED TO, READ BACK BY AND VERIFIED WITH: Earnest Rosier [phone] FCP Performed at [facility], [place] [address] [place], [place] [address]    Culture GRAM NEGATIVE RODS  Final   Report Status PENDING  Incomplete  Blood Culture ID Panel (Reflexed)     Status: Abnormal   Collection Time: 10/04/18 12:01 AM  Result Value Ref Range Status   Enterococcus species NOT DETECTED NOT DETECTED Final   Listeria monocytogenes NOT DETECTED NOT DETECTED Final   Staphylococcus species NOT DETECTED NOT DETECTED Final   Staphylococcus aureus (BCID) NOT DETECTED NOT DETECTED Final   Streptococcus species NOT DETECTED NOT DETECTED Final   Streptococcus agalactiae NOT DETECTED NOT DETECTED Final   Streptococcus pneumoniae NOT DETECTED NOT DETECTED Final   Streptococcus pyogenes NOT DETECTED NOT DETECTED Final   Acinetobacter baumannii NOT DETECTED NOT DETECTED Final   Enterobacteriaceae species DETECTED (A) NOT DETECTED Final    Comment: Enterobacteriaceae represent a large family of gram-negative bacteria, not a single organism. CRITICAL RESULT CALLED TO, READ BACK BY AND VERIFIED WITH: PHARMD [place] [phone] FCP    Enterobacter cloacae complex NOT DETECTED NOT DETECTED Final   Escherichia coli NOT DETECTED NOT DETECTED Final   Klebsiella oxytoca NOT DETECTED NOT DETECTED Final   Klebsiella pneumoniae DETECTED (A) NOT DETECTED  Final    Comment: CRITICAL RESULT CALLED TO, READ BACK BY AND VERIFIED WITH: PHARMD ANDREW MEYER [phone] FCP    Proteus species NOT DETECTED NOT DETECTED Final   Serratia marcescens NOT DETECTED  NOT DETECTED Final   Carbapenem resistance NOT DETECTED NOT DETECTED Final   Haemophilus influenzae NOT DETECTED NOT DETECTED Final   Neisseria meningitidis NOT DETECTED NOT DETECTED Final   Pseudomonas aeruginosa NOT DETECTED NOT DETECTED Final   Candida albicans NOT DETECTED NOT DETECTED Final   Candida glabrata NOT DETECTED NOT DETECTED Final   Candida krusei NOT DETECTED NOT DETECTED Final   Candida parapsilosis NOT DETECTED NOT DETECTED Final   Candida tropicalis NOT DETECTED NOT DETECTED Final  Culture, blood (routine x 2)     Status: Abnormal (Preliminary result)   Collection Time: 10/04/18 12:07 AM  Result Value Ref Range Status   Specimen Description BLOOD LEFT FOREARM  Final   Special Requests   Final    BOTTLES DRAWN AEROBIC AND ANAEROBIC Blood Culture results may not be optimal due to an inadequate volume of blood received in culture bottles Performed at [facility], [address] [place], [place] [address]    Culture  Setup Time   Final    GRAM NEGATIVE RODS IN BOTH AEROBIC AND ANAEROBIC BOTTLES CRITICAL VALUE NOTED.  VALUE IS CONSISTENT WITH PREVIOUSLY REPORTED AND CALLED VALUE.    Culture KLEBSIELLA PNEUMONIAE (A)  Final   Report Status PENDING  Incomplete  MRSA PCR Screening     Status: None   Collection Time: 10/04/18  6:06 PM  Result Value Ref Range Status   MRSA by PCR NEGATIVE NEGATIVE Final    Comment:        The GeneXpert MRSA Assay (FDA approved for NASAL specimens only), is one component of a comprehensive MRSA colonization surveillance program. It is not intended to diagnose MRSA infection nor to guide or monitor treatment for MRSA infections. Performed at [facility], [place] [address] [place], [place] [address]     Radiographs and  labs were personally reviewed by me.   Bobby Rumpf, MD [facility] [phone] 10/05/2018, 11:22 AM

## 2018-10-05 NOTE — Progress Notes (Signed)
PROGRESS NOTE    [name]  XBM:[id number] DOB: 27-Oct-1950 DOA: 10/03/2018 PCP: Biagio Borg, MD     Brief Narrative:  [name] a 68 y.o.malewithESRD on hemodialysis on Tuesday Thursday Saturday, CAD, hypothyroidism, gout, recently admitted for sepsis and has had multiple episodes of bacteremia and also recently treated for C. difficile still on vancomycin was not feeling well for last couple of days feeling tired and weak. Last evening patient found it difficult to walk and had to rest on his bed following which patient wife found that patient was very confused and had a bowel movement. After that patient was difficult to make him walk and EMS was called. Patient remained confused at least for an hour or 2. Prior to which patient also had some chest pressure. Patient wife states that patient has had left wrist gout attack and has started back on prednisone yesterday which patient took first dose.   New events last 24 hours / Subjective: Continues to complain of left ankle pain where he has scraped his skin when he fell at home.  Also with left wrist pain from his gout.  Denies any fevers or chills.  No abdominal pain or rectal pain.  Has about 3 formed stools daily.  Assessment & Plan:   Principal Problem:   Acute respiratory failure with hypoxia ([name]) Active Problems:   Thyrotoxicosis   CAD S/P percutaneous coronary angioplasty   OSA (obstructive sleep apnea)   Hyperthyroidism   Chest pain   S/P right total knee replacement   DM (diabetes mellitus) type II controlled with renal manifestation ([name])   PAD (peripheral artery disease) ([name])   ESRD on hemodialysis ([name])   Essential hypertension   Chronic combined systolic (congestive) and diastolic (congestive) heart failure ([name])   Acute encephalopathy   Cerebral embolism with cerebral infarction   Recurrent Klebsiella bacteremia Unclear source Infectious disease consulted, planning to evaluate right upper  extremity fistula with ultrasound Asked cardiology to schedule for TEE this week Continue Rocephin  Right cerebellar infarct Neurology following  MRA head no large vessel occlusion Carotid Doppler 1 - 39% stenosis bilaterally Echocardiogram pending PT OT recommending home health  Chest pain Troponin flat trend 0.04  CTA chest negative for PE Echo pending  Cardiology consulted, no further ischemic work-up planned  Elevated LFT Acute hepatitis panel pending Right upper quadrant ultrasound showed pneumobilia which is chronic in nature post cholecystectomy Trending downward   C. difficile Continue vancomycin, this was diagnosed prior to this admission  Gout of left wrist Continue prednisone, allopurinol, colchicine   Diabetes mellitus type 2 Sliding-scale insulin  ESRD on dialysis TTS Nephrology consulted  Hyperkalemia Lokelma  on admission Underwent dialysis last night  Depression Continue Celexa  Hyperthyroidism Continue methimazole  OSA CPAP nightly    DVT prophylaxis: Subq hep  Code Status: Full code Family Communication: No family at bedside, discussed extensively with wife over the phone Disposition Plan: Pending further imaging studies, continue IV Rocephin   Consultants:   Nephrology  Cardiology  Neurology  Infectious disease  Procedures:   None  Antimicrobials:  Anti-infectives (From admission, onward)   Start     Dose/Rate Route Frequency Ordered Stop   10/08/18 1000  vancomycin (VANCOCIN) 50 mg/mL oral solution 125 mg     125 mg Oral every 72 hours 10/04/18 0706 10/23/18 0959   10/04/18 2000  cefTRIAXone (ROCEPHIN) 2 g in sodium chloride 0.9 % 100 mL IVPB     2 g 200 mL/hr  over 30 Minutes Intravenous Every 24 hours 10/04/18 1918     10/04/18 1000  vancomycin (VANCOCIN) 125 MG capsule 125 mg  Status:  Discontinued    Note to Pharmacy:  Starting 09/19/18 take 1 tablet every other day for 8 days. Started 10/07/2018 take 1  tablet every three days for 2 weeks.     125 mg Oral Every other day 10/04/18 0458 10/04/18 0706   10/04/18 1000  vancomycin (VANCOCIN) 50 mg/mL oral solution 125 mg     125 mg Oral Every 48 hours 10/04/18 0706 10/08/18 0959        Objective: Vitals:   10/05/18 0139 10/05/18 0500 10/05/18 0818 10/05/18 1221  BP:  118/69 99/74 99/63   Pulse:  83 86 86  Resp: 18 18 20 18   Temp:  98.1 F (36.7 C) 98.8 F (37.1 C) 98.1 F (36.7 C)  TempSrc:  Oral Oral Oral  SpO2: 96% 95% 94% 95%  Weight:  94.8 kg    Height:        Intake/Output Summary (Last 24 hours) at 10/05/2018 1325 Last data filed at 10/05/2018 0946 Gross per 24 hour  Intake 477 ml  Output 1841 ml  Net -1364 ml   Filed Weights   10/04/18 1941 10/04/18 2334 10/05/18 0500  Weight: 97.9 kg 96.1 kg 94.8 kg    Examination:  General exam: Appears calm and comfortable  Respiratory system: Clear to auscultation. Respiratory effort normal. Cardiovascular system: S1 & S2 heard, RRR. No JVD, murmurs, rubs, gallops or clicks. No pedal edema. Gastrointestinal system: Abdomen is nondistended, soft and nontender. No organomegaly or masses felt. Normal bowel sounds heard. Central nervous system: Alert and oriented. No focal neurological deficits. Extremities: Symmetric 5 x 5 power. +RUE fistula with palpable thrill  Skin: Small abrasion left dorsolateral foot  Psychiatry: Judgement and insight appear normal. Mood & affect appropriate.   Data Reviewed: I have personally reviewed following labs and imaging studies  CBC: Recent Labs  Lab 10/03/18 2342 10/04/18 0520 10/05/18 0454  WBC 11.8* 16.7* 10.9*  NEUTROABS 9.7*  --   --   HGB 12.4* 11.9* 12.0*  HCT 41.5 39.5 38.8*  MCV 95.6 95.0 93.3  PLT 263 285 211   Basic Metabolic Panel: Recent Labs  Lab 10/03/18 2342 10/03/18 2343 10/04/18 0520 10/05/18 0454  NA  --  140 138 136  K  --  5.0 6.1* 5.8*  CL  --  100 98 94*  CO2  --  25 26 26   GLUCOSE  --  190* 176* 146*  BUN   --  43* 47* 28*  CREATININE  --  9.70* 10.43* 6.80*  CALCIUM  --  9.1 9.3 9.0  MG 2.0  --   --   --    GFR: Estimated Creatinine Clearance: 11.6 mL/min (A) (by C-G formula based on SCr of 6.8 mg/dL (H)). Liver Function Tests: Recent Labs  Lab 10/03/18 2343 10/05/18 0454  AST 471* 156*  ALT 439* 272*  ALKPHOS 235* 222*  BILITOT 2.1* 1.6*  PROT 7.4 7.5  ALBUMIN 3.1* 3.0*   No results for input(s): LIPASE, AMYLASE in the last 168 hours. No results for input(s): AMMONIA in the last 168 hours. Coagulation Profile: No results for input(s): INR, PROTIME in the last 168 hours. Cardiac Enzymes: Recent Labs  Lab 10/03/18 2342 10/04/18 0520 10/04/18 1105 10/04/18 1650  CKTOTAL 347  --   --   --   TROPONINI  --  0.04* 0.04* 0.04*  BNP (last 3 results) No results for input(s): PROBNP in the last 8760 hours. HbA1C: Recent Labs    10/04/18 1105  HGBA1C 6.0*   CBG: Recent Labs  Lab 10/04/18 0822 10/04/18 1655 10/05/18 0005 10/05/18 0815 10/05/18 1135  GLUCAP 138* 115* 120* 115* 154*   Lipid Profile: Recent Labs    10/04/18 1105  CHOL 171  HDL 66  LDLCALC 68  TRIG 185*  CHOLHDL 2.6   Thyroid Function Tests: No results for input(s): TSH, T4TOTAL, FREET4, T3FREE, THYROIDAB in the last 72 hours. Anemia Panel: No results for input(s): VITAMINB12, FOLATE, FERRITIN, TIBC, IRON, RETICCTPCT in the last 72 hours. Sepsis Labs: Recent Labs  Lab 10/03/18 2334 10/04/18 0259  LATICACIDVEN 2.71* 2.03*    Recent Results (from the past 240 hour(s))  Culture, blood (routine x 2)     Status: Abnormal (Preliminary result)   Collection Time: 10/04/18 12:01 AM  Result Value Ref Range Status   Specimen Description BLOOD LEFT ANTECUBITAL  Final   Special Requests   Final    BOTTLES DRAWN AEROBIC AND ANAEROBIC Blood Culture results may not be optimal due to an inadequate volume of blood received in culture bottles   Culture  Setup Time   Final    GRAM NEGATIVE RODS IN BOTH  AEROBIC AND ANAEROBIC BOTTLES CRITICAL RESULT CALLED TO, READ BACK BY AND VERIFIED WITH: Earnest Rosier [phone] FCP Performed at [facility], [place] [address] [place], [place] [address]    Culture KLEBSIELLA PNEUMONIAE (A)  Final   Report Status PENDING  Incomplete  Blood Culture ID Panel (Reflexed)     Status: Abnormal   Collection Time: 10/04/18 12:01 AM  Result Value Ref Range Status   Enterococcus species NOT DETECTED NOT DETECTED Final   Listeria monocytogenes NOT DETECTED NOT DETECTED Final   Staphylococcus species NOT DETECTED NOT DETECTED Final   Staphylococcus aureus (BCID) NOT DETECTED NOT DETECTED Final   Streptococcus species NOT DETECTED NOT DETECTED Final   Streptococcus agalactiae NOT DETECTED NOT DETECTED Final   Streptococcus pneumoniae NOT DETECTED NOT DETECTED Final   Streptococcus pyogenes NOT DETECTED NOT DETECTED Final   Acinetobacter baumannii NOT DETECTED NOT DETECTED Final   Enterobacteriaceae species DETECTED (A) NOT DETECTED Final    Comment: Enterobacteriaceae represent a large family of gram-negative bacteria, not a single organism. CRITICAL RESULT CALLED TO, READ BACK BY AND VERIFIED WITH: [place] [phone] FCP    Enterobacter cloacae complex NOT DETECTED NOT DETECTED Final   Escherichia coli NOT DETECTED NOT DETECTED Final   Klebsiella oxytoca NOT DETECTED NOT DETECTED Final   Klebsiella pneumoniae DETECTED (A) NOT DETECTED Final    Comment: CRITICAL RESULT CALLED TO, READ BACK BY AND VERIFIED WITH: PHARMD ANDREW MEYER [phone] FCP    Proteus species NOT DETECTED NOT DETECTED Final   Serratia marcescens NOT DETECTED NOT DETECTED Final   Carbapenem resistance NOT DETECTED NOT DETECTED Final   Haemophilus influenzae NOT DETECTED NOT DETECTED Final   Neisseria meningitidis NOT DETECTED NOT DETECTED Final   Pseudomonas aeruginosa NOT DETECTED NOT DETECTED Final   Candida albicans NOT DETECTED NOT DETECTED Final   Candida  glabrata NOT DETECTED NOT DETECTED Final   Candida krusei NOT DETECTED NOT DETECTED Final   Candida parapsilosis NOT DETECTED NOT DETECTED Final   Candida tropicalis NOT DETECTED NOT DETECTED Final  Culture, blood (routine x 2)     Status: Abnormal (Preliminary result)   Collection Time: 10/04/18 12:07 AM  Result Value Ref Range Status   Specimen Description BLOOD LEFT FOREARM  Final   Special Requests   Final    BOTTLES DRAWN AEROBIC AND ANAEROBIC Blood Culture results may not be optimal due to an inadequate volume of blood received in culture bottles Performed at [facility] [address] [place], [place] [address]    Culture  Setup Time   Final    GRAM NEGATIVE RODS IN BOTH AEROBIC AND ANAEROBIC BOTTLES CRITICAL VALUE NOTED.  VALUE IS CONSISTENT WITH PREVIOUSLY REPORTED AND CALLED VALUE.    Culture KLEBSIELLA PNEUMONIAE (A)  Final   Report Status PENDING  Incomplete  MRSA PCR Screening     Status: None   Collection Time: 10/04/18  6:06 PM  Result Value Ref Range Status   MRSA by PCR NEGATIVE NEGATIVE Final    Comment:        The GeneXpert MRSA Assay (FDA approved for NASAL specimens only), is one component of a comprehensive MRSA colonization surveillance program. It is not intended to diagnose MRSA infection nor to guide or monitor treatment for MRSA infections. Performed at [facility], [place] [address] [place], [place] [address]        Radiology Studies: Dg Chest 2 View  Result Date: 10/03/2018 CLINICAL DATA:  Altered mental status EXAM: CHEST - 2 VIEW COMPARISON:  08/23/2018 FINDINGS: Mild cardiomegaly. No acute consolidation or effusion. No pneumothorax. Postsurgical changes in the cervical spine. IMPRESSION: No active cardiopulmonary disease.  Cardiomegaly. Electronically Signed   By: Donavan Foil M.D.   On: 10/03/2018 23:59   Ct Head Wo Contrast  Result Date: 10/04/2018 CLINICAL DATA:  Altered mental status. EXAM: CT HEAD WITHOUT CONTRAST  TECHNIQUE: Contiguous axial images were obtained from the base of the skull through the vertex without intravenous contrast. COMPARISON:  CT 08/19/2018 FINDINGS: Brain: Brain volume is normal for age. Mild chronic small vessel ischemia, unchanged from prior. No intracranial hemorrhage, mass effect, or midline shift. No hydrocephalus. The basilar cisterns are patent. No evidence of territorial infarct or acute ischemia. No extra-axial or intracranial fluid collection. Vascular: Atherosclerosis of skullbase vasculature without hyperdense vessel or abnormal calcification. Skull: No fracture or focal lesion. Sinuses/Orbits: No acute finding. Trace mucosal thickening in the sphenoid sinus, unchanged. Other: None. IMPRESSION: No acute intracranial abnormality. Electronically Signed   By: Keith Rake M.D.   On: 10/04/2018 02:39   Ct Angio Chest Pe W Or Wo Contrast  Result Date: 10/04/2018 CLINICAL DATA:  Hypoxia. Altered mental status. Dialysis patient. EXAM: CT ANGIOGRAPHY CHEST WITH CONTRAST TECHNIQUE: Multidetector CT imaging of the chest was performed using the standard protocol during bolus administration of intravenous contrast. Multiplanar CT image reconstructions and MIPs were obtained to evaluate the vascular anatomy. CONTRAST:  165mL ISOVUE-370 IOPAMIDOL (ISOVUE-370) INJECTION 76% COMPARISON:  Chest radiography 10/03/2018 FINDINGS: Cardiovascular: Pulmonary arterial opacification is good. There are no pulmonary emboli. The heart is enlarged. There is extensive coronary artery calcification. There is aortic atherosclerosis without aneurysm or dissection. Mediastinum/Nodes: No mass or lymphadenopathy. Lungs/Pleura: No pleural effusion. Mild dependent pulmonary atelectasis. No infiltrate or mass. Upper Abdomen: Air in the biliary tree as seen previously. No acute upper abdominal finding. Musculoskeletal: Ordinary thoracic spondylosis, most pronounced at T11-12. Review of the MIP images confirms the above  findings. IMPRESSION: 1. No pulmonary emboli or other acute chest pathology. 2. Air in the biliary tree as seen previously. Aortic Atherosclerosis (ICD10-I70.0). Electronically Signed   By: Nelson Chimes M.D.   On: 10/04/2018  07:29   Mr Brain Wo Contrast  Result Date: 10/04/2018 CLINICAL DATA:  Initial evaluation for acute weakness, confusion, altered mental status. EXAM: MRI HEAD WITHOUT CONTRAST TECHNIQUE: Multiplanar, multiecho pulse sequences of the brain and surrounding structures were obtained without intravenous contrast. COMPARISON:  Prior CT from earlier the same day. FINDINGS: Brain: Mild diffuse prominence of the CSF containing spaces compatible with generalized age-related cerebral atrophy. Patchy and confluent T2/FLAIR hyperintensity within the periventricular and deep white matter both cerebral hemispheres most compatible with chronic microvascular ischemic change, moderate nature. Chronic microvascular ischemic disease noted within the pons as well. Subcentimeter remote right cerebellar infarct noted. 6 mm focus of diffusion abnormality within the right cerebellar hemisphere consistent with a small acute ischemic infarct (series 5, image 57). No associated hemorrhage or mass effect. No other evidence for acute or subacute ischemia. Gray-white matter differentiation otherwise maintained. No evidence for acute or chronic intracranial hemorrhage. No mass lesion, midline shift or mass effect. No hydrocephalus. No extra-axial fluid collection. Pituitary gland within normal limits. Vascular: Major intracranial vascular flow voids are maintained. Skull and upper cervical spine: Craniocervical junction within normal limits. Postsurgical changes noted within the upper cervical spine, partially visualized. No focal marrow replacing lesion. Scalp soft tissues unremarkable. Sinuses/Orbits: Globes and orbital soft tissues within normal limits. Mild chronic mucosal thickening seen throughout the paranasal  sinuses. No air-fluid levels to suggest acute sinusitis. Trace opacity bilateral mastoid air cells, of doubtful significance. Inner ear structures grossly normal. Other: None. IMPRESSION: 1. 6 mm acute ischemic nonhemorrhagic right cerebellar infarct. 2. Underlying age-related cerebral atrophy with moderate chronic small vessel ischemic disease, with additional subcentimeter chronic right cerebellar infarct. Electronically Signed   By: Jeannine Boga M.D.   On: 10/04/2018 06:22   Mr Jodene Nam Head Wo Contrast  Result Date: 10/04/2018 CLINICAL DATA:  Follow-up acute stroke. 6 mm right cerebellar infarction. EXAM: MRA HEAD WITHOUT CONTRAST TECHNIQUE: Angiographic images of the Circle of Willis were obtained using MRA technique without intravenous contrast. COMPARISON:  MRI same day. FINDINGS: Both internal carotid arteries are patent through the skull base and siphon regions. There is atherosclerotic irregularity in the carotid siphon regions but no flow limiting stenosis. The anterior and middle cerebral vessels are patent without proximal stenosis, aneurysm or vascular malformation. More distal branch vessels show some atherosclerotic irregularity. Both vertebral arteries are patent to the basilar. No basilar stenosis. Posterior circulation branch vessels are patent. Posterior cerebral artery in superior cerebellar artery branches distally show some atherosclerotic irregularity. IMPRESSION: No large or medium vessel occlusion or correctable proximal stenosis. Distal branch vessel atherosclerotic narrowing and irregularity seen throughout, more prominent within the superior cerebellar and posterior cerebral branches. Electronically Signed   By: [place] Chimes M.D.   On: 10/04/2018 10:22   Ct Renal Stone Study  Result Date: 10/04/2018 CLINICAL DATA:  Right flank pain EXAM: CT ABDOMEN AND PELVIS WITHOUT CONTRAST TECHNIQUE: Multidetector CT imaging of the abdomen and pelvis was performed following the standard  protocol without IV contrast. COMPARISON:  08/20/2018 FINDINGS: Lower chest: 4 mm right lower lobe pulmonary nodule on image 7 is stable dating back to 09/27/2011. Hepatobiliary: Postcholecystectomy and pneumobilia are stable. No obvious liver mass. Pancreas: Unremarkable Spleen: Unremarkable Adrenals/Urinary Tract: Adrenal glands are within normal limits. There is advanced atrophy of both kidneys. Several low-density lesions are scattered throughout both kidneys. They are not significantly changed. There is no hydronephrosis. Calcifications within the central kidneys are likely vascular. These are also stable. No definite ureteral calculus or hydroureter.  Bladder is decompressed. Stomach/Bowel: Normal appendix. There is no obvious mass in the visualized colon. No disproportionate dilatation of small bowel to suggest obstruction. Nonspecific air-fluid levels in small bowel are noted. Stomach is decompressed. Vascular/Lymphatic: Atherosclerotic calcifications of the aorta and iliac arteries are present. Maximal diameter of the abdominal aorta is 2.6 cm. Reproductive: There surgical clips in the prostate gland. Other: No free fluid. Musculoskeletal: Spinal fusion hardware is in place. There is solid fusion across the L4-5 disc space. Ray cage is are in place at the L3-4 disc space. There is some bony bridging across the posterior elements at this level. No vertebral compression. IMPRESSION: No evidence of urinary calculus or urinary obstruction. Stable exam. Electronically Signed   By: Marybelle Killings M.D.   On: 10/04/2018 08:57   Vas US Carotid (at [facility] Only)  Result Date: 10/04/2018 Carotid Arterial Duplex Study Indications: CVA. Performing Technologist: June Leap RDMS, RVT  Examination Guidelines: A complete evaluation includes B-mode imaging, spectral Doppler, color Doppler, and power Doppler as needed of all accessible portions of each vessel. Bilateral testing is considered an integral part of a complete  examination. Limited examinations for reoccurring indications may be performed as noted.  Right Carotid Findings: +----------+--------+--------+--------+----------+--------+           PSV cm/sEDV cm/sStenosisDescribe  Comments +----------+--------+--------+--------+----------+--------+ CCA Prox  54      11              hypoechoic         +----------+--------+--------+--------+----------+--------+ CCA Distal60      11                                 +----------+--------+--------+--------+----------+--------+ ICA Prox  60      16      1-39%   calcific           +----------+--------+--------+--------+----------+--------+ ICA Distal130     36                                 +----------+--------+--------+--------+----------+--------+ ECA       126     5                                  +----------+--------+--------+--------+----------+--------+ +----------+--------+-------+----------------+-------------------+           PSV cm/sEDV cmsDescribe        Arm Pressure (mmHG) +----------+--------+-------+----------------+-------------------+ [id number]            Multiphasic, WNL                    +----------+--------+-------+----------------+-------------------+ +---------+--------+--+--------+--+---------+ VertebralPSV cm/s60EDV cm/s13Antegrade +---------+--------+--+--------+--+---------+  Left Carotid Findings: +----------+--------+--------+--------+------------+--------+           PSV cm/sEDV cm/sStenosisDescribe    Comments +----------+--------+--------+--------+------------+--------+ CCA Prox  90      20              heterogenous         +----------+--------+--------+--------+------------+--------+ CCA Distal64      12                                   +----------+--------+--------+--------+------------+--------+ ICA Prox  55      17      1-39%  heterogenous         +----------+--------+--------+--------+------------+--------+  ICA Distal114     27                                   +----------+--------+--------+--------+------------+--------+ ECA       82      5                                    +----------+--------+--------+--------+------------+--------+ +----------+--------+--------+----------------+-------------------+ SubclavianPSV cm/sEDV cm/sDescribe        Arm Pressure (mmHG) +----------+--------+--------+----------------+-------------------+           120             Multiphasic, WNL                    +----------+--------+--------+----------------+-------------------+ +---------+--------+--+--------+--+---------+ VertebralPSV cm/s56EDV cm/s19Antegrade +---------+--------+--+--------+--+---------+  Summary: Right Carotid: Velocities in the right ICA are consistent with a 1-39% stenosis. Left Carotid: Velocities in the left ICA are consistent with a 1-39% stenosis.  *See table(s) above for measurements and observations.     Preliminary    US Abdomen Limited Ruq  Result Date: 10/04/2018 CLINICAL DATA:  History of hepatitis. EXAM: ULTRASOUND ABDOMEN LIMITED RIGHT UPPER QUADRANT COMPARISON:  CT abdomen and pelvis earlier today. FINDINGS: Gallbladder: Removed. Common bile duct: Diameter: 0.5 cm Liver: Pneumobilia and mild intrahepatic biliary ductal prominence in the left hepatic lobe as seen on CT earlier today noted. No focal lesion. Portal vein is patent on color Doppler imaging with normal direction of blood flow towards the liver. IMPRESSION: No acute or focal abnormality. Status post cholecystectomy. Pneumobilia and mild intrahepatic biliary ductal prominence in the left hepatic lobe as seen on CT earlier today. Electronically Signed   By: Inge Rise M.D.   On: 10/04/2018 16:15      Scheduled Meds: . allopurinol  100 mg Oral Daily  . Chlorhexidine Gluconate Cloth  6 each Topical Q0600  . cinacalcet  90 mg Oral BID WC  . citalopram  10 mg Oral QHS  . colchicine  0.6 mg Oral  Daily  . colesevelam  1,875 mg Oral BID WC  . ezetimibe  10 mg Oral Daily  . feeding supplement (NEPRO CARB STEADY)  237 mL Oral BID BM  . ferric citrate  420 mg Oral TID WC  . gabapentin  100 mg Oral QHS  . heparin  5,000 Units Subcutaneous Q8H  . insulin aspart  0-9 Units Subcutaneous TID WC  . methimazole  2.5 mg Oral Daily  . multivitamin  1 tablet Oral QHS  . polyethylene glycol  17 g Oral Daily  . predniSONE  10 mg Oral BID WC  . sodium zirconium cyclosilicate  10 g Oral Once  . vancomycin  125 mg Oral Q48H   Followed by  . [START ON 10/08/2018] vancomycin  125 mg Oral Q72H   Continuous Infusions: . sodium chloride    . sodium chloride    . cefTRIAXone (ROCEPHIN)  IV 2 g (10/05/18 0107)     LOS: 1 day    Time spent: 35 minutes   Dessa Phi, DO [facility] [email or web address] Password [email or web address] 10/05/2018, 1:25 PM

## 2018-10-05 NOTE — Evaluation (Signed)
Occupational Therapy Evaluation Patient Details Name: [name] MRN: [id number] DOB: Mar 13, 1951 Today's Date: 10/05/2018    History of Present Illness 68 y.o. male with ESRD on hemodialysis on Tuesday Thursday Saturday, CAD, hypothyroidism, gout, recently admitted for sepsis and has had multiple episodes of bacteremia and also recently treated for C. difficile still on vancomycin was not feeling well for last couple of days feeling tired and weak. MRI. MRI: 6 mm right cerebellar infarct.    Clinical Impression   PTA, pt was living with his wife and performed BADLs and used RW for functional mobility; wife reports she has been assisting with ADLs recently due to fatigue and pain. Pt currently requiring Supervision-Min A for ADLs and supervision for functional transfers with RW. Pt presenting with fatigue, decreased strength, poor functional use of dominant hand, and decreased cognition. Pt becoming quickly irritable and frustrated (especially towards his wife). Pt's wife reporting he was receiving [place] therapy from [facility]. Pt would benefit from further acute OT to facilitate safe dc. Recommend dc to home with HHOT for further OT to optimize safety, independence with ADLs, and return to PLOF.      Follow Up Recommendations  Home health OT;Supervision/Assistance - 24 hour(Resume HH therapy)    Equipment Recommendations  None recommended by OT    Recommendations for Other Services PT consult     Precautions / Restrictions Precautions Precautions: Fall Precaution Comments: hx of fall prior to admission Restrictions Weight Bearing Restrictions: No      Mobility Bed Mobility Overal bed mobility: Modified Independent Bed Mobility: Supine to Sit;Sit to Supine     Supine to sit: Supervision Sit to supine: Supervision   General bed mobility comments: Increased time  Transfers Overall transfer level: Needs assistance Equipment used: Rolling walker (2 wheeled) Transfers: Sit to/from  Stand Sit to Stand: Supervision         General transfer comment: supervision for safety    Balance Overall balance assessment: Needs assistance Sitting-balance support: No upper extremity supported;Feet unsupported Sitting balance-Leahy Scale: Good Sitting balance - Comments: able to pull socks on and off without assist   Standing balance support: No upper extremity supported;During functional activity Standing balance-Leahy Scale: Fair Standing balance comment: Able to maintain standing balance while pulling pants over hips                           ADL either performed or assessed with clinical judgement   ADL Overall ADL's : Needs assistance/impaired Eating/Feeding: Set up;Sitting Eating/Feeding Details (indicate cue type and reason): Pt sitting at EOB to eat with lunch. Pt able to manage urtensils and open containers. However, pt using right hand instead of dominant hand due to gout pain at LUE. Grooming: Set up;Supervision/safety;Sitting   Upper Body Bathing: Supervision/ safety;Sitting   Lower Body Bathing: With caregiver independent assisting;Sit to/from stand;Sitting/lateral leans Lower Body Bathing Details (indicate cue type and reason): Wife assisting pt with peri care and LB bathing while he sat at EOB and laterally leaned. Upper Body Dressing : Supervision/safety;Sitting   Lower Body Dressing: Supervision/safety;Set up;Sit to/from stand Lower Body Dressing Details (indicate cue type and reason): supervision to don/doff depends and pt able to bring ankles to knees for LB dressing.  Toilet Transfer: Supervision/safety;Ambulation;RW(simulated in room)           Functional mobility during ADLs: Supervision/safety;Rolling walker General ADL Comments: Pt presenting with self limiting behaviors and quickly becomes frustrated. Pt performing at supervision level but  wife assisting due to pt fatigue, pain, and frustration     Vision         Perception      Praxis      Pertinent Vitals/Pain Pain Assessment: Faces Faces Pain Scale: Hurts a little bit Pain Location: bilateral feet, skin tear on L lateral dorsum and R anterior shin Pain Descriptors / Indicators: Discomfort Pain Intervention(s): Limited activity within patient's tolerance;Monitored during session;Repositioned     Hand Dominance Left   Extremity/Trunk Assessment Upper Extremity Assessment Upper Extremity Assessment: LUE deficits/detail LUE Deficits / Details: Decreased strength, FM coordination, and ROM due to gout. Pt declines any decreased sensation or weakness. LUE Coordination: decreased fine motor   Lower Extremity Assessment Lower Extremity Assessment: Defer to PT evaluation RLE Deficits / Details: ROM WFL, strength grossly assessed in bed, hip 4/5, knee ext 4/5, flex 4/5, dorsiflex 3+/5, plantar flex 4/5 RLE Sensation: history of peripheral neuropathy;decreased light touch(to min shin ) LLE Deficits / Details: ROM WFL, strength in hip 3+/5, knee extension 3+/5, knee flex 4/5, ankle dorsiflexion 3+/5, plantarflexion 4/5  LLE Sensation: decreased light touch;history of peripheral neuropathy(decreased sensation to mid shin)       Communication Communication Communication: No difficulties   Cognition Arousal/Alertness: Awake/alert Behavior During Therapy: Agitated;Anxious Overall Cognitive Status: Impaired/Different from baseline Area of Impairment: Attention;Following commands;Safety/judgement;Awareness;Problem solving                   Current Attention Level: Sustained   Following Commands: Follows one step commands with increased time Safety/Judgement: Decreased awareness of safety Awareness: Emergent Problem Solving: Slow processing;Requires verbal cues General Comments: Pt very irritable and quickly becoming frustrated or angry (especially towards wife). Pt's wife reports he can emotional at home but this is worse than normal. Pt requiring  increased cues and time throughout.   General Comments  Wife present throughout session. Pt very concerned about pt's medical status.    Exercises     Shoulder Instructions      Home Living Family/patient expects to be discharged to:: Private residence Living Arrangements: Spouse/significant other Available Help at Discharge: Family;Available 24 hours/day Type of Home: House Home Access: Stairs to enter [employer] of Steps: 3 Entrance Stairs-Rails: Right Home Layout: One level     Bathroom Shower/Tub: Occupational psychologist: Standard     Home Equipment: Environmental consultant - 2 wheels;Cane - single point;Shower seat;Bedside commode;Walker - 4 wheels          Prior Functioning/Environment Level of Independence: Independent with assistive device(s)        Comments: Used RW for ambulation. Pt reports he performs BADLs; wife reports she assists with ADLs recently due to fatigue and pain. Reports he does not do alot on his dialysis days and mostly sits in recliner. Working with HHPT to improve strength and endurance        OT Problem List: Decreased strength;Decreased range of motion;Decreased activity tolerance;Impaired balance (sitting and/or standing);Decreased knowledge of use of DME or AE;Decreased knowledge of precautions;Decreased cognition;Decreased safety awareness;Pain;Impaired UE functional use      OT Treatment/Interventions: Self-care/ADL training;Therapeutic exercise;Energy conservation;DME and/or AE instruction;Therapeutic activities;Patient/family education    OT Goals(Current goals can be found in the care plan section) Acute Rehab OT Goals Patient Stated Goal: "Find out why I am sick" OT Goal Formulation: With patient/family Time For Goal Achievement: 10/19/18 Potential to Achieve Goals: Good  OT Frequency: Min 2X/week   Barriers to D/C:  Co-evaluation              AM-PAC OT "6 Clicks" Daily Activity     Outcome Measure  Help from another person eating meals?: None Help from another person taking care of personal grooming?: A Little Help from another person toileting, which includes using toliet, bedpan, or urinal?: A Little Help from another person bathing (including washing, rinsing, drying)?: A Little Help from another person to put on and taking off regular upper body clothing?: None Help from another person to put on and taking off regular lower body clothing?: None 6 Click Score: 21   End of Session Nurse Communication: Mobility status  Activity Tolerance: Patient limited by pain;Treatment limited secondary to agitation Patient left: in bed;with call bell/phone within reach;with family/visitor present  OT Visit Diagnosis: Unsteadiness on feet (R26.81);Other abnormalities of gait and mobility (R26.89);Muscle weakness (generalized) (M62.81);Pain Pain - Right/Left: Left Pain - part of body: Leg                Time: 8115-7262 OT Time Calculation (min): 26 min Charges:  OT General Charges $OT Visit: 1 Visit OT Evaluation $OT Eval Moderate Complexity: 1 Mod OT Treatments $Self Care/Home Management : 8-22 mins  Geniene List MSOT, OTR/L Acute Rehab Pager: [phone] Office: [place] 10/05/2018, 2:02 PM

## 2018-10-05 NOTE — Progress Notes (Signed)
HD tx ended 43 min early @ 2307 d/t pt's refusal to stay on tx UF goal not met Blood rinsed back VSS Report called to Kennieth Francois, RN Will call and make Dr. Maryjane Hurter aware of pt coming off early

## 2018-10-05 NOTE — Plan of Care (Signed)
  Problem: Education: Goal: Knowledge of General Education information will improve Description Including pain rating scale, medication(s)/side effects and non-pharmacologic comfort measures Outcome: Progressing   Problem: Health Behavior/Discharge Planning: Goal: Ability to manage health-related needs will improve Outcome: Progressing   

## 2018-10-05 NOTE — Progress Notes (Signed)
OT Cancellation Note  Patient Details Name: [name] MRN: [id number] DOB: 1950/11/09   Cancelled Treatment:    Reason Eval/Treat Not Completed: Patient declined, no reason specified(Pt stating "Man, it is early. How is someone suppose to sleep?" Pt agreeable to therapy returning later today. Will return as schedule allows. Thank you)  [place], OTR/L Acute Rehab Pager: [phone] Office: [phone] 10/05/2018, 8:07 AM

## 2018-10-05 NOTE — Evaluation (Signed)
Physical Therapy Evaluation Patient Details Name: [name] MRN: [id number] DOB: 1950/10/25 Today's Date: 10/05/2018   History of Present Illness  68 y.o. male with ESRD on hemodialysis on Tuesday Thursday Saturday, CAD, hypothyroidism, gout, recently admitted for sepsis and has had multiple episodes of bacteremia and also recently treated for C. difficile still on vancomycin was not feeling well for last couple of days feeling tired and weak. MRI. MRI: 6 mm right cerebellar infarct.   Clinical Impression  PTA pt reports working with HHPT on strength and endurance, utilizing RW for home ambulation, wife assisting with bathing and dressing since last hospital admission. Pt initially sat up at [facility] with supervision for safety and discussed pain in bilateral feet from skin tears he sustained during fall prior to entering hospital. Once in sitting, PT recommended getting out of bed and going for walk, at which point pt pulled himself back into bed and stated "I'm not going anywhere until they figure out what is wrong with me. Last time I walked down the hall and they sent me home but I just got worse and had to come back. I'm tired of this.", With maximal verbal encouragement, pt participated in limited bed level exam. Pt encouraged to maintain strength he does have with OOB mobility. Pt refused. PT recommends HHPT level rehab at d/c to continue to work on strength and endurance at d/c, however if pt does not work on [place] mobility he may require SNF level rehab before going home. PT will continue to follow acutely.     Follow Up Recommendations Home health PT;Supervision/Assistance - 24 hour    Equipment Recommendations  None recommended by PT       Precautions / Restrictions Precautions Precautions: Fall Precaution Comments: hx of fall prior to admission Restrictions Weight Bearing Restrictions: No      Mobility  Bed Mobility Overal bed mobility: Needs Assistance Bed Mobility: Supine to  Sit;Sit to Supine     Supine to sit: Supervision Sit to supine: Supervision   General bed mobility comments: supervision for safety, increased time and effort        Balance Overall balance assessment: Needs assistance Sitting-balance support: No upper extremity supported;Feet unsupported Sitting balance-Leahy Scale: Good Sitting balance - Comments: able to pull socks on and off without assist                                     Pertinent Vitals/Pain Pain Assessment: Faces Faces Pain Scale: Hurts a little bit Pain Location: bilateral feet, skin tear on L lateral dorsum and R anterior shin Pain Intervention(s): Limited activity within patient's tolerance;Monitored during session;Repositioned    Home Living Family/patient expects to be discharged to:: Private residence Living Arrangements: Spouse/significant other Available Help at Discharge: Family;Available 24 hours/day Type of Home: House Home Access: Stairs to enter Entrance Stairs-Rails: Right Entrance Stairs-Number of Steps: 3 Home Layout: One level Home Equipment: Walker - 2 wheels;Cane - single point;Shower seat;Bedside commode;Walker - 4 wheels      Prior Function Level of Independence: Independent with assistive device(s)         Comments: Used RW for ambulation. Reports he does not do alot on his dialysis days and mostly sits in recliner. Working with HHPT to improve strength and endurance        Extremity/Trunk Assessment   Upper Extremity Assessment Upper Extremity Assessment: Defer to OT evaluation    Lower  Extremity Assessment Lower Extremity Assessment: RLE deficits/detail;LLE deficits/detail RLE Deficits / Details: ROM WFL, strength grossly assessed in bed, hip 4/5, knee ext 4/5, flex 4/5, dorsiflex 3+/5, plantar flex 4/5 RLE Sensation: history of peripheral neuropathy;decreased light touch(to min shin ) LLE Deficits / Details: ROM WFL, strength in hip 3+/5, knee extension 3+/5,  knee flex 4/5, ankle dorsiflexion 3+/5, plantarflexion 4/5  LLE Sensation: decreased light touch;history of peripheral neuropathy(decreased sensation to mid shin)       Communication   Communication: No difficulties  Cognition Arousal/Alertness: Awake/alert Behavior During Therapy: Agitated;Anxious Overall Cognitive Status: History of cognitive impairments - at baseline                                 General Comments: pt admitted with AMS, no family present to determine baseline, decreased awareness for need to get out of bed to maintain strength and mobility       General Comments General comments (skin integrity, edema, etc.): Pt with skin tears on bilateral feet, increased dryness         Assessment/Plan    PT Assessment Patient needs continued PT services  PT Problem List Decreased strength;Decreased activity tolerance;Decreased balance;Decreased mobility;Decreased coordination;Decreased cognition;Decreased safety awareness;Impaired sensation       PT Treatment Interventions DME instruction;Gait training;Stair training;Functional mobility training;Therapeutic activities;Therapeutic exercise;Balance training;Cognitive remediation;Neuromuscular re-education;Patient/family education    PT Goals (Current goals can be found in the Care Plan section)  Acute Rehab PT Goals Patient Stated Goal: "find out what is wrong" PT Goal Formulation: With patient Time For Goal Achievement: 10/19/18 Potential to Achieve Goals: Fair    Frequency Min 3X/week    AM-PAC PT "6 Clicks" Mobility  Outcome Measure Help needed turning from your back to your side while in a flat bed without using bedrails?: A Little Help needed moving from lying on your back to sitting on the side of a flat bed without using bedrails?: A Little Help needed moving to and from a bed to a chair (including a wheelchair)?: A Little Help needed standing up from a chair using your arms (e.g., wheelchair or  bedside chair)?: A Lot Help needed to walk in hospital room?: A Lot Help needed climbing 3-5 steps with a railing? : A Lot 6 Click Score: 15    End of Session   Activity Tolerance: Treatment limited secondary to agitation Patient left: in bed;with call bell/phone within reach;with bed alarm set Nurse Communication: Mobility status;Other (comment)(refusal to get up until "they figure out what is wrong") PT Visit Diagnosis: Other abnormalities of gait and mobility (R26.89);Muscle weakness (generalized) (M62.81);Other symptoms and signs involving the nervous system (U13.244)    Time: 0102-7253 PT Time Calculation (min) (ACUTE ONLY): 22 min   Charges:   PT Evaluation $PT Eval Moderate Complexity: 1 Mod          Donnamae [place] B. Migdalia Dk PT, DPT Acute Rehabilitation Services Pager [phone] Office [phone]   [place] 10/05/2018, 11:32 AM

## 2018-10-05 NOTE — Progress Notes (Signed)
[facility] Progress Note   Subjective:  HD last night. Ended treatment 71mins early "just wanted to get out of there"  Breathing fine today. Sats ok on RA  BC+ Klebsiella pneumonia K+ 5.8   Objective Vitals:   10/05/18 0139 10/05/18 0500 10/05/18 0818 10/05/18 1221  BP:  118/69 99/74 99/63   Pulse:  83 86 86  Resp: 18 18 20 18   Temp:  98.1 F (36.7 C) 98.8 F (37.1 C) 98.1 F (36.7 C)  TempSrc:  Oral Oral Oral  SpO2: 96% 95% 94% 95%  Weight:  94.8 kg    Height:       Physical Exam General: Elderly male NAD Heart: RRR Lungs: CTAB  Abdomen: soft NT Extremities: No LE edema Dialysis Access: RUA AVF+   Dialysis Orders:  AF TTS  4h 450/800 EDW 97.5kg 2K/2.25Ca  Na Linear Prof 2 AVF No heparin [name] 9mcg IV TIW  Assessment/Plan: 1. Recurrent Klebsiella bacteremia. Source unclear. ID following.  On Rocephin For TEE, repeat BC.  2. AMS - acute R cerebellar infart on Brain MRI - Neuro consulted  3. ESRD -TTS. HD terminated early last night. K 5.8. Repeat K in am. May need extra treatment tomorrow.  4. HTN/volume - BP/volume ok.  5. Anemia - Hgb 12.0 No ESA needs  6. MBD  - Continue [name]/Auryxia binder/Sensipar  7. Nutrition - Renal diet/fluid restriction/prostat  8. C diff. On Vanc.    Lynnda Child PA-C [facility] Pager [phone] 10/05/2018,12:33 PM  LOS: 1 day   Additional Objective Labs: Basic Metabolic Panel: Recent Labs  Lab 10/03/18 2343 10/04/18 0520 10/05/18 0454  NA 140 138 136  K 5.0 6.1* 5.8*  CL 100 98 94*  CO2 25 26 26   GLUCOSE 190* 176* 146*  BUN 43* 47* 28*  CREATININE 9.70* 10.43* 6.80*  CALCIUM 9.1 9.3 9.0   CBC: Recent Labs  Lab 10/03/18 2342 10/04/18 0520 10/05/18 0454  WBC 11.8* 16.7* 10.9*  NEUTROABS 9.7*  --   --   HGB 12.4* 11.9* 12.0*  HCT 41.5 39.5 38.8*  MCV 95.6 95.0 93.3  PLT 263 285 238   Blood Culture    Component Value Date/Time   SDES BLOOD LEFT FOREARM  10/04/2018 0007   SPECREQUEST  10/04/2018 0007    BOTTLES DRAWN AEROBIC AND ANAEROBIC Blood Culture results may not be optimal due to an inadequate volume of blood received in culture bottles Performed at [facility], [place] [address], [place], [place] [address]    CULT KLEBSIELLA PNEUMONIAE (A) 10/04/2018 0007   REPTSTATUS PENDING 10/04/2018 0007    Cardiac Enzymes: Recent Labs  Lab 10/03/18 2342 10/04/18 0520 10/04/18 1105 10/04/18 1650  CKTOTAL 347  --   --   --   TROPONINI  --  0.04* 0.04* 0.04*   CBG: Recent Labs  Lab 10/04/18 0822 10/04/18 1655 10/05/18 0005 10/05/18 0815 10/05/18 1135  GLUCAP 138* 115* 120* 115* 154*   Iron Studies: No results for input(s): IRON, TIBC, TRANSFERRIN, FERRITIN in the last 72 hours. Lab Results  Component Value Date   INR 1.15 08/19/2018   INR 1.12 07/16/2018   INR 1.09 07/16/2018   Medications: . sodium chloride    . sodium chloride    . cefTRIAXone (ROCEPHIN)  IV 2 g (10/05/18 0107)   . allopurinol  100 mg Oral Daily  . Chlorhexidine Gluconate Cloth  6 each Topical Q0600  . cinacalcet  90 mg Oral BID WC  .  citalopram  10 mg Oral QHS  . colchicine  0.6 mg Oral Daily  . colesevelam  1,875 mg Oral BID WC  . ezetimibe  10 mg Oral Daily  . feeding supplement (NEPRO CARB STEADY)  237 mL Oral BID BM  . ferric citrate  420 mg Oral TID WC  . gabapentin  100 mg Oral QHS  . heparin  5,000 Units Subcutaneous Q8H  . insulin aspart  0-9 Units Subcutaneous TID WC  . methimazole  2.5 mg Oral Daily  . multivitamin  1 tablet Oral QHS  . polyethylene glycol  17 g Oral Daily  . predniSONE  10 mg Oral BID WC  . sodium zirconium cyclosilicate  10 g Oral Once  . vancomycin  125 mg Oral Q48H   Followed by  . [START ON 10/08/2018] vancomycin  125 mg Oral Q72H

## 2018-10-05 NOTE — Progress Notes (Signed)
[facility]    NEPHROLOGY PROGRESS NOTE  SUBJECTIVE: Patient reports not feeling well, but denies specific complaint.  Denies chest pain, shortness of breath, nausea, vomiting, diarrhea or dysuria.  All of the review of systems are negative.  Did not complete his dialysis treatment yesterday.  Signed off with 45 minutes remaining.  Repeat potassium was hemolyzed.   OBJECTIVE:  Vitals:   10/05/18 0818 10/05/18 1221  BP: 99/74 99/63  Pulse: 86 86  Resp: 20 18  Temp: 98.8 F (37.1 C) 98.1 F (36.7 C)  SpO2: 94% 95%   I/O last 3 completed shifts: In: -  Out: 1841 [Other:1841]   Gen[place]l:  AAOx3 NAD HEENT: MMM [place] AT anicteric sclera Neck:  No JVD, no adenopathy CV:  Heart RRR  Lungs:  L/S CTA bilaterally Abd:  abd SNT/ND with normal BS GU:  Bladder non-palpable Extremities:  No LE edema.  Right upper extremity fistula with good thrill and bruit Skin:  No skin rash  MEDICATIONS:   Current Facility-Administered Medications:  .  0.9 %  sodium chloride infusion, 100 mL, Intravenous, PRN, Tawn Fitzner A, DO .  0.9 %  sodium chloride infusion, 100 mL, Intravenous, PRN, Belicia Difatta A, DO .  acetaminophen (TYLENOL) tablet 650 mg, 650 mg, Oral, Q6H PRN, 650 mg at 10/05/18 0926 **OR** acetaminophen (TYLENOL) suppository 650 mg, 650 mg, Rectal, Q6H PRN, Rise Patience, MD .  allopurinol (ZYLOPRIM) tablet 100 mg, 100 mg, Oral, Daily, Rise Patience, MD, 100 mg at 10/05/18 0160 .  alteplase (CATHFLO ACTIVASE) injection 2 mg, 2 mg, Intracatheter, Once PRN, Addam Goeller A, DO .  aspirin tablet 325 mg, 325 mg, Oral, Daily, Lilyan Gilford I, NP .  cefTRIAXone (ROCEPHIN) 2 g in sodium chloride 0.9 % 100 mL IVPB, 2 g, Intravenous, Q24H, Rise Patience, MD, Stopped at 10/05/18 0136 .  Chlorhexidine Gluconate Cloth 2 % PADS 6 each, 6 each, Topical, Q0600, Clarissa Laird A, DO .  cinacalcet (SENSIPAR) tablet 90 mg, 90 mg, Oral, BID WC, Rise Patience, MD, 90 mg at 10/05/18 1626 .  citalopram (CELEXA) tablet 10 mg, 10 mg, Oral, QHS, Rise Patience, MD, 10 mg at 10/05/18 0102 .  colchicine tablet 0.6 mg, 0.6 mg, Oral, Daily, Rise Patience, MD, 0.6 mg at 10/05/18 0926 .  colesevelam [facility]) tablet 1,875 mg, 1,875 mg, Oral, BID WC, Rise Patience, MD, 1,875 mg at 10/05/18 1625 .  ezetimibe (ZETIA) tablet 10 mg, 10 mg, Oral, Daily, Rise Patience, MD, 10 mg at 10/05/18 1093 .  feeding supplement (NEPRO CARB STEADY) liquid 237 mL, 237 mL, Oral, BID BM, Rise Patience, MD, 237 mL at 10/05/18 1627 .  ferric citrate (AURYXIA) tablet 420 mg, 420 mg, Oral, TID WC, Rise Patience, MD, 420 mg at 10/05/18 1626 .  gabapentin (NEURONTIN) capsule 100 mg, 100 mg, Oral, QHS, Rise Patience, MD, 100 mg at 10/05/18 0101 .  heparin injection 1,000 Units, 1,000 Units, Dialysis, PRN, Shyniece Scripter A, DO .  heparin injection 1,000 Units, 1,000 Units, Dialysis, PRN, Asif Muchow A, DO .  heparin injection 5,000 Units, 5,000 Units, Subcutaneous, Q8H, Rise Patience, MD, 5,000 Units at 10/05/18 1626 .  insulin aspart (novoLOG) injection 0-9 Units, 0-9 Units, Subcutaneous, TID WC, Rise Patience, MD, 1 Units at 10/05/18 1720 .  lidocaine (PF) (XYLOCAINE) 1 % injection 5 mL, 5 mL, Intradermal, PRN, Berlie Persky A, DO .  lidocaine-prilocaine (EMLA) cream 1 application, 1  application, Topical, PRN, Zealand Boyett A, DO .  methimazole (TAPAZOLE) tablet 2.5 mg, 2.5 mg, Oral, Daily, Rise Patience, MD, 2.5 mg at 10/05/18 9147 .  multivitamin (RENA-VIT) tablet 1 tablet, 1 tablet, Oral, QHS, Rise Patience, MD, 1 tablet at 10/05/18 0101 .  ondansetron (ZOFRAN) tablet 4 mg, 4 mg, Oral, Q6H PRN, 4 mg at 10/04/18 0751 **OR** ondansetron (ZOFRAN) injection 4 mg, 4 mg, Intravenous, Q6H PRN, Rise Patience, MD .  pentafluoroprop-tetrafluoroeth (GEBAUERS) aerosol 1 application, 1 application, Topical,  PRN, Jameer Storie A, DO .  polyethylene glycol (MIRALAX / GLYCOLAX) packet 17 g, 17 g, Oral, Daily, Gean Birchwood N, MD .  predniSONE (DELTASONE) tablet 10 mg, 10 mg, Oral, BID WC, Rise Patience, MD, 10 mg at 10/05/18 1625 .  sodium zirconium cyclosilicate (LOKELMA) packet 10 g, 10 g, Oral, Once, Dessa Phi, DO .  vancomycin (VANCOCIN) 50 mg/mL oral solution 125 mg, 125 mg, Oral, Q48H, 125 mg at 10/04/18 1252 **FOLLOWED BY** [START ON 10/08/2018] vancomycin (VANCOCIN) 50 mg/mL oral solution 125 mg, 125 mg, Oral, Q72H, Dessa Phi, DO     LABS:  CBC Latest Ref Rng & Units 10/05/2018 10/04/2018 10/03/2018  WBC 4.0 - 10.5 K/uL 10.9(H) 16.7(H) 11.8(H)  Hemoglobin 13.0 - 17.0 g/dL 12.0(L) 11.9(L) 12.4(L)  Hematocrit 39.0 - 52.0 % 38.8(L) 39.5 41.5  Platelets 150 - 400 K/uL 238 285 263    CMP Latest Ref Rng & Units 10/05/2018 10/04/2018 10/03/2018  Glucose 70 - 99 mg/dL 146(H) 176(H) 190(H)  BUN 8 - 23 mg/dL 28(H) 47(H) 43(H)  Creatinine 0.61 - 1.24 mg/dL 6.80(H) 10.43(H) 9.70(H)  Sodium 135 - 145 mmol/L 136 138 140  Potassium 3.5 - 5.1 mmol/L 5.8(H) 6.1(H) 5.0  Chloride 98 - 111 mmol/L 94(L) 98 100  CO2 22 - 32 mmol/L 26 26 25   Calcium 8.9 - 10.3 mg/dL 9.0 9.3 9.1  Total Protein 6.5 - 8.1 g/dL 7.5 - 7.4  Total Bilirubin 0.3 - 1.2 mg/dL 1.6(H) - 2.1(H)  Alkaline Phos 38 - 126 U/L 222(H) - 235(H)  AST 15 - 41 U/L 156(H) - 471(H)  ALT 0 - 44 U/L 272(H) - 439(H)    Lab Results  Component Value Date   CALCIUM 9.0 10/05/2018   CAION 0.95 (L) 08/19/2018   PHOS 4.4 08/30/2018       Component Value Date/Time   COLORURINE LT. YELLOW 06/26/2013 1200   APPEARANCEUR CLEAR 06/26/2013 1200   LABSPEC 1.020 06/26/2013 1200   PHURINE 8.5 06/26/2013 1200   GLUCOSEU 100 06/26/2013 1200   HGBUR MODERATE 06/26/2013 1200   BILIRUBINUR NEGATIVE 06/26/2013 1200   KETONESUR NEGATIVE 06/26/2013 1200   PROTEINUR >300 (A) 01/04/2010 1032   UROBILINOGEN 0.2 06/26/2013 1200   NITRITE NEGATIVE  06/26/2013 1200   LEUKOCYTESUR TRACE 06/26/2013 1200      Component Value Date/Time   PHART 7.410 08/20/2018 1113   PCO2ART 32.7 08/20/2018 1113   PO2ART 82.3 (L) 08/20/2018 1113   HCO3 19.7 (L) 08/20/2018 1113   TCO2 28 08/19/2018 1716   ACIDBASEDEF 3.7 (H) 08/20/2018 1113   O2SAT 94.3 08/20/2018 1113       Component Value Date/Time   IRON 16 (L) 06/12/2018 0500   TIBC 168 (L) 06/12/2018 0500   FERRITIN 709 (H) 10/17/2016 1103   IRONPCTSAT 10 (L) 06/12/2018 0500       ASSESSMENT/PLAN:     Problem List Items Addressed This Visit      Digestive   Acute hepatitis  Other Visit Diagnoses    Hypoxia    -  Primary   Confusion       End-stage renal disease on hemodialysis ([place])       Anemia associated with chronic renal failure       Elevated lactic acid level       Hepatitis       Relevant Orders   US Abdomen Limited RUQ (Completed)   Bacteremia       Relevant Orders   [place] RT UPPER EXTREM LTD SOFT TISSUE NON VASCULAR (Completed)     1.  End-stage renal disease on hemodialysis.  We will resume his dialysis on Tuesday, Thursday, and Saturday.  His volume status is stable.     2.  C. difficile.  Remains on vancomycin.  3.  Type 2 diabetes.  Continue outpatient treatment.  4.  Anemia of chronic kidney disease.  This is above goal.  Will hold on any inpatient erythropoietin therapy.  5.  Gout.  He complains of left ankle and left wrist pain.  Continued prednisone, allopurinol, and colchicine.    [place], DO, [place]

## 2018-10-05 NOTE — Progress Notes (Signed)
*  PRELIMINARY RESULTS* Echocardiogram 2D Echocardiogram has been performed.  [name] 10/05/2018, 3:25 PM

## 2018-10-06 ENCOUNTER — Encounter (HOSPITAL_COMMUNITY): Payer: Self-pay | Admitting: Internal Medicine

## 2018-10-06 ENCOUNTER — Other Ambulatory Visit: Payer: Self-pay | Admitting: *Deleted

## 2018-10-06 ENCOUNTER — Inpatient Hospital Stay (HOSPITAL_COMMUNITY): Payer: Medicare Other

## 2018-10-06 DIAGNOSIS — Z888 Allergy status to other drugs, medicaments and biological substances status: Secondary | ICD-10-CM

## 2018-10-06 DIAGNOSIS — Z881 Allergy status to other antibiotic agents status: Secondary | ICD-10-CM

## 2018-10-06 DIAGNOSIS — K0889 Other specified disorders of teeth and supporting structures: Secondary | ICD-10-CM

## 2018-10-06 DIAGNOSIS — A0471 Enterocolitis due to Clostridium difficile, recurrent: Secondary | ICD-10-CM

## 2018-10-06 DIAGNOSIS — Z8619 Personal history of other infectious and parasitic diseases: Secondary | ICD-10-CM

## 2018-10-06 DIAGNOSIS — M25532 Pain in left wrist: Secondary | ICD-10-CM

## 2018-10-06 DIAGNOSIS — Z87311 Personal history of (healed) other pathological fracture: Secondary | ICD-10-CM

## 2018-10-06 LAB — CBC
HEMATOCRIT: 39.5 % (ref 39.0–52.0)
Hemoglobin: 12.3 g/dL — ABNORMAL LOW (ref 13.0–17.0)
MCH: 29.1 pg (ref 26.0–34.0)
MCHC: 31.1 g/dL (ref 30.0–36.0)
MCV: 93.6 fL (ref 80.0–100.0)
NRBC: 0 % (ref 0.0–0.2)
Platelets: 284 10*3/uL (ref 150–400)
RBC: 4.22 MIL/uL (ref 4.22–5.81)
RDW: 17.9 % — ABNORMAL HIGH (ref 11.5–15.5)
WBC: 8.8 10*3/uL (ref 4.0–10.5)

## 2018-10-06 LAB — BILIRUBIN, DIRECT: Bilirubin, Direct: 0.1 mg/dL (ref 0.0–0.2)

## 2018-10-06 LAB — AST: AST: 50 U/L — ABNORMAL HIGH (ref 15–41)

## 2018-10-06 LAB — RENAL FUNCTION PANEL
Albumin: 3.1 g/dL — ABNORMAL LOW (ref 3.5–5.0)
Anion gap: 15 (ref 5–15)
BUN: 50 mg/dL — ABNORMAL HIGH (ref 8–23)
CO2: 25 mmol/L (ref 22–32)
Calcium: 9.1 mg/dL (ref 8.9–10.3)
Chloride: 96 mmol/L — ABNORMAL LOW (ref 98–111)
Creatinine, Ser: 9.28 mg/dL — ABNORMAL HIGH (ref 0.61–1.24)
GFR calc Af Amer: 6 mL/min — ABNORMAL LOW (ref >60)
GFR calc non Af Amer: 5 mL/min — ABNORMAL LOW (ref >60)
Glucose, Bld: 149 mg/dL — ABNORMAL HIGH (ref 70–99)
Phosphorus: 5.1 mg/dL — ABNORMAL HIGH (ref 2.5–4.6)
Potassium: 4.9 mmol/L (ref 3.5–5.1)
Sodium: 136 mmol/L (ref 135–145)

## 2018-10-06 LAB — GLUCOSE, CAPILLARY
GLUCOSE-CAPILLARY: 172 mg/dL — AB (ref 70–99)
Glucose-Capillary: 126 mg/dL — ABNORMAL HIGH (ref 70–99)
Glucose-Capillary: 140 mg/dL — ABNORMAL HIGH (ref 70–99)
Glucose-Capillary: 192 mg/dL — ABNORMAL HIGH (ref 70–99)

## 2018-10-06 LAB — BILIRUBIN, TOTAL: Total Bilirubin: 0.7 mg/dL (ref 0.3–1.2)

## 2018-10-06 LAB — ALKALINE PHOSPHATASE: Alkaline Phosphatase: 193 U/L — ABNORMAL HIGH (ref 38–126)

## 2018-10-06 LAB — ALT: ALT: 165 U/L — ABNORMAL HIGH (ref 0–44)

## 2018-10-06 LAB — PROTEIN, TOTAL: Total Protein: 7.8 g/dL (ref 6.5–8.1)

## 2018-10-06 MED ORDER — CHLORHEXIDINE GLUCONATE CLOTH 2 % EX PADS: 6.0000 | MEDICATED_PAD | Freq: Every day | CUTANEOUS | Status: DC

## 2018-10-06 NOTE — Progress Notes (Signed)
[facility] Infusion Coordinator will follow pt with ID team to support home infusion pharmacy services at [place] to home if ordered/needed.  If patient discharges after hours, please call [phone].   Larry Sierras 10/06/2018, 8:20 AM

## 2018-10-06 NOTE — Consult Note (Signed)
   [facility] CM Inpatient Consult   10/06/2018  [name] 09-17-1951 [id number]  Patient is currently active with [employer] [facility] for chronic disease management services.  [facility]  Our community based plan of care has focused on disease management and community resource support.  Patient will receive a post hospital call and will be evaluated for monthly home visits for assessments and disease process education.  Notified Inpatient Case Manager in progression meeting this morning to make aware that [facility] following.  Patient has been in procedures today.  Will continue to follow for progress and disposition needs. Of note, [facility] Care Management services does not replace or interfere with any services that are needed or arranged by inpatient case management or social work.  For additional questions or referrals please contact:'  Natividad Brood, RN BSN [facility] Liaison  [phone] business mobile phone Toll free office [phone]

## 2018-10-06 NOTE — Plan of Care (Signed)
  Problem: Activity: Goal: Risk for activity intolerance will decrease Outcome: Progressing   Problem: Coping: Goal: Level of anxiety will decrease Outcome: Progressing   Problem: Safety: Goal: Ability to remain free from injury will improve Outcome: Progressing   

## 2018-10-06 NOTE — Patient Outreach (Signed)
[name]) Care Management  10/06/2018  [name] [birth year] [id number]   Noted that member was admitted to the hospital on 1/3 for confusion and hypoxia.  Hospital liaisons notified, will follow up with member once discharged.  [name], [place], MSN [place] [phone]

## 2018-10-06 NOTE — Progress Notes (Signed)
EEG Completed; Results Pending  

## 2018-10-06 NOTE — Progress Notes (Signed)
STROKE TEAM PROGRESS NOTE     INTERVAL HISTORY His wife is at bedside today.she feels he is having some hallucinations. Review of his labs show elevated liver enzymes but normal bilirubin. He is on hemodialysis for his end-stage renal disease. He has recurrent Klebsiella bacteremia and C. Difficile for which he is on vancomycin  Vitals:   10/05/18 2329 10/05/18 2334 10/06/18 0531 10/06/18 1228  BP: (!) 144/88  111/66 121/73  Pulse: 86  83 78  Resp: 16  18 18   Temp: (!) 97.4 F (36.3 C)  97.7 F (36.5 C) 98.1 F (36.7 C)  TempSrc: Oral  Oral Oral  SpO2: 96% 96% 97% 96%  Weight:   95.5 kg   Height:        CBC:  Recent Labs  Lab 10/03/18 2342  10/05/18 0454 10/06/18 0533  WBC 11.8*   < > 10.9* 8.8  NEUTROABS 9.7*  --   --   --   HGB 12.4*   < > 12.0* 12.3*  HCT 41.5   < > 38.8* 39.5  MCV 95.6   < > 93.3 93.6  PLT 263   < > 238 284   < > = values in this interval not displayed.    Basic Metabolic Panel:  Recent Labs  Lab 10/03/18 2342  10/05/18 0454 10/06/18 0533  NA  --    < > 136 136  K  --    < > 5.8* 4.9  CL  --    < > 94* 96*  CO2  --    < > 26 25  GLUCOSE  --    < > 146* 149*  BUN  --    < > 28* 50*  CREATININE  --    < > 6.80* 9.28*  CALCIUM  --    < > 9.0 9.1  MG 2.0  --   --   --   PHOS  --   --   --  5.1*   < > = values in this interval not displayed.   Lipid Panel:     Component Value Date/Time   CHOL 171 10/04/2018 1105   TRIG 185 (H) 10/04/2018 1105   HDL 66 10/04/2018 1105   CHOLHDL 2.6 10/04/2018 1105   VLDL 37 10/04/2018 1105   LDLCALC 68 10/04/2018 1105   HgbA1c:  Lab Results  Component Value Date   HGBA1C 6.0 (H) 10/04/2018   Urine Drug Screen: No results found for: LABOPIA, COCAINSCRNUR, LABBENZ, AMPHETMU, THCU, LABBARB  Alcohol Level No results found for: ETH  IMAGING [place] Rt Upper Extrem Ltd Soft Tissue Non Vascular  Result Date: 10/05/2018 CLINICAL DATA:  Recurrent bacteremia and history of end-stage renal disease with right  upper arm AV fistula. EXAM: ULTRASOUND RIGHT UPPER EXTREMITY LIMITED TECHNIQUE: Ultrasound examination of the upper extremity soft tissues was performed in the area of clinical concern. COMPARISON:  08/20/2018 FINDINGS: Aneurysmal right upper arm AV fistula shows patency and flow. There is a mild amount wall thickening and mild mural thrombus in different portions of the fistula. A segment of a venous outflow stent is also identified near the axilla. No evidence of abnormal fluid collection or focal abscess surrounding the fistula. IMPRESSION: Patent right upper arm AV fistula with some areas of wall thickening and mild nonocclusive mural thrombus. Partial visualization of axillary venous outflow stent. No evidence of abnormal fluid collection or abscess surrounding the fistula. Electronically Signed   By: Aletta Edouard M.D.   On:  10/05/2018 14:36   US Abdomen Limited Ruq  Result Date: 10/04/2018 CLINICAL DATA:  History of hepatitis. EXAM: ULTRASOUND ABDOMEN LIMITED RIGHT UPPER QUADRANT COMPARISON:  CT abdomen and pelvis earlier today. FINDINGS: Gallbladder: Removed. Common bile duct: Diameter: 0.5 cm Liver: Pneumobilia and mild intrahepatic biliary ductal prominence in the left hepatic lobe as seen on CT earlier today noted. No focal lesion. Portal vein is patent on color Doppler imaging with normal direction of blood flow towards the liver. IMPRESSION: No acute or focal abnormality. Status post cholecystectomy. Pneumobilia and mild intrahepatic biliary ductal prominence in the left hepatic lobe as seen on CT earlier today. Electronically Signed   By: Inge Rise M.D.   On: 10/04/2018 16:15    PHYSICAL EXAM Pleasant elderly African-American male currently not in distress.   . Afebrile. Head is nontraumatic. Neck is supple without bruit.    Cardiac exam no murmur or gallop. Lungs are clear to auscultation. Distal pulses are well felt. Neurological Examination Mental Status: Alert, oriented, thought  content appropriate. Naming intact. Speech fluent without evidence of aphasia.  Able to follow  commands without difficulty.  Cranial Nerves: II:  Visual fields grossly normal,  III,IV, VI: ptosis not present, extra-ocular motions intact bilaterally, pupils equal, round, reactive to light and accommodation V,VII: smile symmetric, facial light touch sensation normal bilaterally VIII: hearing normal bilaterally IX,X: uvula rises midline XI: bilateral shoulder shrug XII: midline tongue extension Motor: Right :  Upper extremity   5/5              Left:     Upper extremity   4+/5 (no vertical drift)             Lower extremity   5/5                          Lower extremity   4+/5 (no vertical drift) Tone and bulk:normal tone throughout; no atrophy noted Sensory: Pinprick and light touch intact throughout, bilaterally Deep Tendon Reflexes: diminished reflexes throughout Plantars: Right: downgoing                                Left: downgoing Cerebellar: normal finger-to-nose,  normal heel-to-shin test Gait: deferred  ASSESSMENT/PLAN [name] is a 68 y.o. male with history of  presenting with peripheral neuropathy, OSA ( CPAP at night), HTN,hyperthyroidism, CAD,, combine systolic and diastolic HF, DM 2, ESRD ( on hemodialysis) who presented to [facility] ed with c/o AMS.   Found to have an ischemic right cerebellar infarct Likely incidental finding and asymptomatic  Stroke  CT head  No acute intracranial abnormality.  MRI Brain   1. 6 mm acute ischemic nonhemorrhagic right cerebellar infarct. 2. Underlying age-related cerebral atrophy with moderate chronic small vessel ischemic disease, with additional subcentimeter chronic right cerebellar infarct.  MRA   1. 6 mm acute ischemic nonhemorrhagic right cerebellar infarct. 2. Underlying age-related cerebral atrophy with moderate chronic small vessel ischemic disease, with additional subcentimeter chronic right cerebellar  infarct.  2D Echo   No cardiac source of emboli was indentified.   LDL-C 68  HgbA1c 6.0  SCD for VTE prophylaxis Diet Order            Diet renal with fluid restriction Fluid restriction: 1200 mL Fluid; Room service appropriate? Yes; Fluid consistency: Thin  Diet effective now  aspirin 81 mg daily prior to admission, now on aspirin 325 mg daily.  Essentially goal would be to place patient on dual antiplatelet therapy.  He said he was previously on Plavix, and aspirin and had to stop Plavix as a result of a GI bleed.  Subsequently he was placed on aspirin and has been continuous on 81 mg since.  Therapy recommendations: Recommendations is for dual antiplatelet therapy but patient has declined which is understandable.  We have gust benefits and risks of antiplatelet therapy.  He is okay with remaining on aspirin at an increased dose.  He will continue to monitor for blood in his stool, sputum or overt signs of bleeding. On d/c he will resu,e with 325 mg daily   Disposition:    Hypertension  Stable . Long-term BP goal normotensive 110-140  Hyperlipidemia  Home meds: restart as needed-   LDL controlled, goal < 70, elevated triglycerides 185 currently on Zetia   Diabetes type II  HgbA1c  , goal < 7.0  Uncontrolled  Other Stroke Risk Factors  Advanced age  Obesity, Body mass index is 27.77 kg/m., recommend weight loss, diet and exercise as appropriate    Obstructive sleep apnea, on CPAP at home  Congestive heart failure  Other Active Problems  Renal disease  Hospital day # 2    He presented with altered mental status possibly from encephalopathy due to his renal failure but is complaining of syncopal episode and passing out which has happened in the past as well.  MRI shows a tiny right cerebellar infarct related to small vessel disease which is likely clinically asymptomatic.  Patient is refusing Plavix due to prior history of bleeding on it.  Hence  continue aspirin. His confusion and hallucinations are likely multifactorial given liver dysfunction and renal failure and anemia. No further neurological testing is necessary. Stroke team will sign off. Kindly call for questions.    Greater than 50% time during this 25-minute visit was spent on counseling and coordination of care about his silent cerebral infarct and discussion about stroke prevention treatment and answering questions.  Antony Contras, MD Medical Director [facility] Pager: [phone] 10/06/2018 2:00 PM    To contact Stroke Continuity provider, please refer to [email or web address] After hours, contact General Neurology

## 2018-10-06 NOTE — Progress Notes (Addendum)
PROGRESS NOTE    [name]  WRU:[id number] DOB: 1951-02-24 DOA: 10/03/2018 PCP: Biagio Borg, MD     Brief Narrative:  [name] a 68 y.o.malewithESRD on hemodialysis on Tuesday Thursday Saturday, CAD, hypothyroidism, gout, recently admitted for sepsis and has had multiple episodes of bacteremia and also recently treated for C. difficile still on vancomycin was not feeling well for last couple of days feeling tired and weak. Last evening patient found it difficult to walk and had to rest on his bed following which patient wife found that patient was very confused and had a bowel movement. After that patient was difficult to make him walk and EMS was called. Patient remained confused at least for an hour or 2. Prior to which patient also had some chest pressure. Patient wife states that patient has had left wrist gout attack and has started back on prednisone yesterday which patient took first dose.   New events last 24 hours / Subjective: No acute events overnight.  Sleeping comfortably on CPAP. Arouses easily. No new complaints today.   Assessment & Plan:   Principal Problem:   Gram-negative bacteremia Active Problems:   Thyrotoxicosis   CAD S/P percutaneous coronary angioplasty   OSA (obstructive sleep apnea)   Hyperthyroidism   S/P right total knee replacement   DM (diabetes mellitus) type II controlled with renal manifestation ([name])   PAD (peripheral artery disease) ([name])   ESRD on hemodialysis ([place])   Essential hypertension   Chronic combined systolic (congestive) and diastolic (congestive) heart failure ([name])   Cerebral embolism with cerebral infarction   Recurrent Klebsiella bacteremia Unclear source Infectious disease following Right upper extremity ultrasound without abnormal fluid collection or abscess surrounding fistula TEE scheduled 1/7 Continue Rocephin Repeat blood culture drawn on 1/6 pending  Right cerebellar infarct Neurology following    MRA head no large vessel occlusion Carotid Doppler 1 - 39% stenosis bilaterally Echocardiogram without cardiac source of emboli identified EEG pending PT OT recommending home health  Chest pain Troponin flat trend 0.04  CTA chest negative for PE Echo EF 45 to 50% diffuse hypokinesis, grade 1 diastolic dysfunction, similar to echocardiogram in November 2019 Cardiology consulted, no further ischemic work-up planned  Elevated LFT Acute hepatitis panel pending Right upper quadrant ultrasound showed pneumobilia which is chronic in nature post cholecystectomy Trending downward   C. difficile Continue vancomycin, this was diagnosed prior to this admission  Gout of left wrist Continue prednisone, allopurinol, colchicine   Diabetes mellitus type 2 Sliding-scale insulin  ESRD on dialysis TTS Nephrology consulted  Depression Continue Celexa  Hyperthyroidism Continue methimazole  OSA CPAP nightly    DVT prophylaxis: Subq hep  Code Status: Full code Family Communication: No family at bedside Disposition Plan: Pending further work up for bacteremia, stroke, continue IV Rocephin   Consultants:   Nephrology  Cardiology  Neurology  Infectious disease  Procedures:   None  Antimicrobials:  Anti-infectives (From admission, onward)   Start     Dose/Rate Route Frequency Ordered Stop   10/08/18 1000  vancomycin (VANCOCIN) 50 mg/mL oral solution 125 mg     125 mg Oral every 72 hours 10/04/18 0706 10/23/18 0959   10/04/18 2000  cefTRIAXone (ROCEPHIN) 2 g in sodium chloride 0.9 % 100 mL IVPB     2 g 200 mL/hr over 30 Minutes Intravenous Every 24 hours 10/04/18 1918     10/04/18 1000  vancomycin (VANCOCIN) 125 MG capsule 125 mg  Status:  Discontinued  Note to Pharmacy:  Starting 09/19/18 take 1 tablet every other day for 8 days. Started 10/07/2018 take 1 tablet every three days for 2 weeks.     125 mg Oral Every other day 10/04/18 0458 10/04/18 0706   10/04/18  1000  vancomycin (VANCOCIN) 50 mg/mL oral solution 125 mg     125 mg Oral Every 48 hours 10/04/18 0706 10/08/18 0959       Objective: Vitals:   10/05/18 2037 10/05/18 2329 10/05/18 2334 10/06/18 0531  BP: 126/80 (!) 144/88  111/66  Pulse: 91 86  83  Resp: 18 16  18   Temp: 98.4 F (36.9 C) (!) 97.4 F (36.3 C)  97.7 F (36.5 C)  TempSrc: Oral Oral  Oral  SpO2: 96% 96% 96% 97%  Weight:    95.5 kg  Height:        Intake/Output Summary (Last 24 hours) at 10/06/2018 1201 Last data filed at 10/06/2018 0931 Gross per 24 hour  Intake 572.39 ml  Output 0 ml  Net 572.39 ml   Filed Weights   10/04/18 2334 10/05/18 0500 10/06/18 0531  Weight: 96.1 kg 94.8 kg 95.5 kg    Examination: General exam: Appears calm and comfortable  Respiratory system: Clear to auscultation. Respiratory effort normal. Cardiovascular system: S1 & S2 heard, RRR. No JVD, murmurs, rubs, gallops or clicks. No pedal edema. Gastrointestinal system: Abdomen is nondistended, soft and nontender. No organomegaly or masses felt. Normal bowel sounds heard. Central nervous system: Alert and oriented. No focal neurological deficits. Extremities: Symmetric 5 x 5 power. Skin: No rashes, lesions or ulcers Psychiatry: Judgement and insight appear normal. Mood & affect appropriate.    Data Reviewed: I have personally reviewed following labs and imaging studies  CBC: Recent Labs  Lab 10/03/18 2342 10/04/18 0520 10/05/18 0454 10/06/18 0533  WBC 11.8* 16.7* 10.9* 8.8  NEUTROABS 9.7*  --   --   --   HGB 12.4* 11.9* 12.0* 12.3*  HCT 41.5 39.5 38.8* 39.5  MCV 95.6 95.0 93.3 93.6  PLT 263 285 238 025   Basic Metabolic Panel: Recent Labs  Lab 10/03/18 2342 10/03/18 2343 10/04/18 0520 10/05/18 0454 10/06/18 0533  NA  --  140 138 136 136  K  --  5.0 6.1* 5.8* 4.9  CL  --  100 98 94* 96*  CO2  --  25 26 26 25   GLUCOSE  --  190* 176* 146* 149*  BUN  --  43* 47* 28* 50*  CREATININE  --  9.70* 10.43* 6.80* 9.28*    CALCIUM  --  9.1 9.3 9.0 9.1  MG 2.0  --   --   --   --   PHOS  --   --   --   --  5.1*   GFR: Estimated Creatinine Clearance: 8.5 mL/min (A) (by C-G formula based on SCr of 9.28 mg/dL (H)). Liver Function Tests: Recent Labs  Lab 10/03/18 2343 10/05/18 0454 10/06/18 0533  AST 471* 156* 50*  ALT 439* 272* 165*  ALKPHOS 235* 222* 193*  BILITOT 2.1* 1.6* 0.7  PROT 7.4 7.5 7.8  ALBUMIN 3.1* 3.0* 3.1*   No results for input(s): LIPASE, AMYLASE in the last 168 hours. No results for input(s): AMMONIA in the last 168 hours. Coagulation Profile: No results for input(s): INR, PROTIME in the last 168 hours. Cardiac Enzymes: Recent Labs  Lab 10/03/18 2342 10/04/18 0520 10/04/18 1105 10/04/18 1650  CKTOTAL 347  --   --   --  TROPONINI  --  0.04* 0.04* 0.04*   BNP (last 3 results) No results for input(s): PROBNP in the last 8760 hours. HbA1C: Recent Labs    10/04/18 1105  HGBA1C 6.0*   CBG: Recent Labs  Lab 10/05/18 1135 10/05/18 1630 10/05/18 2146 10/06/18 0742 10/06/18 1140  GLUCAP 154* 123* 194* 126* 140*   Lipid Profile: Recent Labs    10/04/18 1105  CHOL 171  HDL 66  LDLCALC 68  TRIG 185*  CHOLHDL 2.6   Thyroid Function Tests: No results for input(s): TSH, T4TOTAL, FREET4, T3FREE, THYROIDAB in the last 72 hours. Anemia Panel: No results for input(s): VITAMINB12, FOLATE, FERRITIN, TIBC, IRON, RETICCTPCT in the last 72 hours. Sepsis Labs: Recent Labs  Lab 10/03/18 2334 10/04/18 0259  LATICACIDVEN 2.71* 2.03*    Recent Results (from the past 240 hour(s))  Culture, blood (routine x 2)     Status: Abnormal (Preliminary result)   Collection Time: 10/04/18 12:01 AM  Result Value Ref Range Status   Specimen Description BLOOD LEFT ANTECUBITAL  Final   Special Requests   Final    BOTTLES DRAWN AEROBIC AND ANAEROBIC Blood Culture results may not be optimal due to an inadequate volume of blood received in culture bottles   Culture  Setup Time   Final     GRAM NEGATIVE RODS IN BOTH AEROBIC AND ANAEROBIC BOTTLES CRITICAL RESULT CALLED TO, READ BACK BY AND VERIFIED WITH: Earnest Rosier [phone] FCP Performed at [facility], [place] [address] [place], [place] [address]    Culture (A)  Final    KLEBSIELLA PNEUMONIAE SUSCEPTIBILITIES PERFORMED ON PREVIOUS CULTURE WITHIN THE LAST 5 DAYS.    Report Status PENDING  Incomplete  Blood Culture ID Panel (Reflexed)     Status: Abnormal   Collection Time: 10/04/18 12:01 AM  Result Value Ref Range Status   Enterococcus species NOT DETECTED NOT DETECTED Final   Listeria monocytogenes NOT DETECTED NOT DETECTED Final   Staphylococcus species NOT DETECTED NOT DETECTED Final   Staphylococcus aureus (BCID) NOT DETECTED NOT DETECTED Final   Streptococcus species NOT DETECTED NOT DETECTED Final   Streptococcus agalactiae NOT DETECTED NOT DETECTED Final   Streptococcus pneumoniae NOT DETECTED NOT DETECTED Final   Streptococcus pyogenes NOT DETECTED NOT DETECTED Final   Acinetobacter baumannii NOT DETECTED NOT DETECTED Final   Enterobacteriaceae species DETECTED (A) NOT DETECTED Final    Comment: Enterobacteriaceae represent a large family of gram-negative bacteria, not a single organism. CRITICAL RESULT CALLED TO, READ BACK BY AND VERIFIED WITH: [place] [phone] FCP    Enterobacter cloacae complex NOT DETECTED NOT DETECTED Final   Escherichia coli NOT DETECTED NOT DETECTED Final   Klebsiella oxytoca NOT DETECTED NOT DETECTED Final   Klebsiella pneumoniae DETECTED (A) NOT DETECTED Final    Comment: CRITICAL RESULT CALLED TO, READ BACK BY AND VERIFIED WITH: PHARMD ANDREW MEYER [phone] FCP    Proteus species NOT DETECTED NOT DETECTED Final   Serratia marcescens NOT DETECTED NOT DETECTED Final   Carbapenem resistance NOT DETECTED NOT DETECTED Final   Haemophilus influenzae NOT DETECTED NOT DETECTED Final   Neisseria meningitidis NOT DETECTED NOT DETECTED Final   Pseudomonas  aeruginosa NOT DETECTED NOT DETECTED Final   Candida albicans NOT DETECTED NOT DETECTED Final   Candida glabrata NOT DETECTED NOT DETECTED Final   Candida krusei NOT DETECTED NOT DETECTED Final   Candida parapsilosis NOT DETECTED NOT DETECTED Final   Candida tropicalis NOT DETECTED NOT DETECTED  Final  Culture, blood (routine x 2)     Status: Abnormal (Preliminary result)   Collection Time: 10/04/18 12:07 AM  Result Value Ref Range Status   Specimen Description BLOOD LEFT FOREARM  Final   Special Requests   Final    BOTTLES DRAWN AEROBIC AND ANAEROBIC Blood Culture results may not be optimal due to an inadequate volume of blood received in culture bottles   Culture  Setup Time   Final    GRAM NEGATIVE RODS IN BOTH AEROBIC AND ANAEROBIC BOTTLES CRITICAL VALUE NOTED.  VALUE IS CONSISTENT WITH PREVIOUSLY REPORTED AND CALLED VALUE.    Culture KLEBSIELLA PNEUMONIAE (A)  Final   Report Status PENDING  Incomplete   Organism ID, Bacteria KLEBSIELLA PNEUMONIAE  Final      Susceptibility   Klebsiella pneumoniae - MIC*    AMPICILLIN >=32 RESISTANT Resistant     CEFAZOLIN <=4 SENSITIVE Sensitive     CEFEPIME <=1 SENSITIVE Sensitive     CEFTAZIDIME <=1 SENSITIVE Sensitive     CEFTRIAXONE <=1 SENSITIVE Sensitive     CIPROFLOXACIN <=0.25 SENSITIVE Sensitive     GENTAMICIN <=1 SENSITIVE Sensitive     IMIPENEM <=0.25 SENSITIVE Sensitive     TRIMETH/SULFA <=20 SENSITIVE Sensitive     AMPICILLIN/SULBACTAM 8 SENSITIVE Sensitive     PIP/TAZO 16 SENSITIVE Sensitive     Extended ESBL NEGATIVE Sensitive     * KLEBSIELLA PNEUMONIAE  MRSA PCR Screening     Status: None   Collection Time: 10/04/18  6:06 PM  Result Value Ref Range Status   MRSA by PCR NEGATIVE NEGATIVE Final    Comment:        The GeneXpert MRSA Assay (FDA approved for NASAL specimens only), is one component of a comprehensive MRSA colonization surveillance program. It is not intended to diagnose MRSA infection nor to guide  or monitor treatment for MRSA infections. Performed at [facility], [place] [address], [place], [place] [address]        Radiology Studies: [place] Rt Upper Extrem Ltd Soft Tissue Non Vascular  Result Date: 10/05/2018 CLINICAL DATA:  Recurrent bacteremia and history of end-stage renal disease with right upper arm AV fistula. EXAM: ULTRASOUND RIGHT UPPER EXTREMITY LIMITED TECHNIQUE: Ultrasound examination of the upper extremity soft tissues was performed in the area of clinical concern. COMPARISON:  08/20/2018 FINDINGS: Aneurysmal right upper arm AV fistula shows patency and flow. There is a mild amount wall thickening and mild mural thrombus in different portions of the fistula. A segment of a venous outflow stent is also identified near the axilla. No evidence of abnormal fluid collection or focal abscess surrounding the fistula. IMPRESSION: Patent right upper arm AV fistula with some areas of wall thickening and mild nonocclusive mural thrombus. Partial visualization of axillary venous outflow stent. No evidence of abnormal fluid collection or abscess surrounding the fistula. Electronically Signed   By: Aletta Edouard M.D.   On: 10/05/2018 14:36   Vas US Carotid (at [facility] Only)  Result Date: 10/05/2018 Carotid Arterial Duplex Study Indications: CVA. Performing Technologist: June Leap RDMS, RVT  Examination Guidelines: A complete evaluation includes B-mode imaging, spectral Doppler, color Doppler, and power Doppler as needed of all accessible portions of each vessel. Bilateral testing is considered an integral part of a complete examination. Limited examinations for reoccurring indications may be performed as noted.  Right Carotid Findings: +----------+--------+--------+--------+----------+--------+           PSV cm/sEDV cm/sStenosisDescribe  Comments +----------+--------+--------+--------+----------+--------+ CCA Prox  54      11              hypoechoic          +----------+--------+--------+--------+----------+--------+ CCA Distal60      11                                 +----------+--------+--------+--------+----------+--------+ ICA Prox  60      16      1-39%   calcific           +----------+--------+--------+--------+----------+--------+ ICA Distal130     36                                 +----------+--------+--------+--------+----------+--------+ ECA       126     5                                  +----------+--------+--------+--------+----------+--------+ +----------+--------+-------+----------------+-------------------+           PSV cm/sEDV cmsDescribe        Arm Pressure (mmHG) +----------+--------+-------+----------------+-------------------+ [id number]            Multiphasic, WNL                    +----------+--------+-------+----------------+-------------------+ +---------+--------+--+--------+--+---------+ VertebralPSV cm/s60EDV cm/s13Antegrade +---------+--------+--+--------+--+---------+  Left Carotid Findings: +----------+--------+--------+--------+------------+--------+           PSV cm/sEDV cm/sStenosisDescribe    Comments +----------+--------+--------+--------+------------+--------+ CCA Prox  90      20              heterogenous         +----------+--------+--------+--------+------------+--------+ CCA Distal64      12                                   +----------+--------+--------+--------+------------+--------+ ICA Prox  55      17      1-39%   heterogenous         +----------+--------+--------+--------+------------+--------+ ICA Distal114     27                                   +----------+--------+--------+--------+------------+--------+ ECA       82      5                                    +----------+--------+--------+--------+------------+--------+ +----------+--------+--------+----------------+-------------------+ SubclavianPSV cm/sEDV cm/sDescribe         Arm Pressure (mmHG) +----------+--------+--------+----------------+-------------------+           120             Multiphasic, WNL                    +----------+--------+--------+----------------+-------------------+ +---------+--------+--+--------+--+---------+ VertebralPSV cm/s56EDV cm/s19Antegrade +---------+--------+--+--------+--+---------+  Summary: Right Carotid: Velocities in the right ICA are consistent with a 1-39% stenosis. Left Carotid: Velocities in the left ICA are consistent with a 1-39% stenosis. Vertebrals: Bilateral vertebral arteries demonstrate antegrade flow. *See table(s) above for measurements and observations.  Electronically signed by Antony Contras MD on 10/05/2018 at 2:10:42 PM.    Final    US  Abdomen Limited Ruq  Result Date: 10/04/2018 CLINICAL DATA:  History of hepatitis. EXAM: ULTRASOUND ABDOMEN LIMITED RIGHT UPPER QUADRANT COMPARISON:  CT abdomen and pelvis earlier today. FINDINGS: Gallbladder: Removed. Common bile duct: Diameter: 0.5 cm Liver: Pneumobilia and mild intrahepatic biliary ductal prominence in the left hepatic lobe as seen on CT earlier today noted. No focal lesion. Portal vein is patent on color Doppler imaging with normal direction of blood flow towards the liver. IMPRESSION: No acute or focal abnormality. Status post cholecystectomy. Pneumobilia and mild intrahepatic biliary ductal prominence in the left hepatic lobe as seen on CT earlier today. Electronically Signed   By: Inge Rise M.D.   On: 10/04/2018 16:15      Scheduled Meds: . allopurinol  100 mg Oral Daily  . aspirin  325 mg Oral Daily  . Chlorhexidine Gluconate Cloth  6 each Topical Q0600  . cinacalcet  90 mg Oral BID WC  . citalopram  10 mg Oral QHS  . colchicine  0.6 mg Oral Daily  . colesevelam  1,875 mg Oral BID WC  . ezetimibe  10 mg Oral Daily  . feeding supplement (NEPRO CARB STEADY)  237 mL Oral BID BM  . ferric citrate  420 mg Oral TID WC  . gabapentin  100  mg Oral QHS  . heparin  5,000 Units Subcutaneous Q8H  . insulin aspart  0-9 Units Subcutaneous TID WC  . methimazole  2.5 mg Oral Daily  . multivitamin  1 tablet Oral QHS  . polyethylene glycol  17 g Oral Daily  . predniSONE  10 mg Oral BID WC  . sodium zirconium cyclosilicate  10 g Oral Once  . vancomycin  125 mg Oral Q48H   Followed by  . [START ON 10/08/2018] vancomycin  125 mg Oral Q72H   Continuous Infusions: . sodium chloride    . sodium chloride    . cefTRIAXone (ROCEPHIN)  IV 2 g (10/05/18 2259)     LOS: 2 days    Time spent: 20 minutes   Dessa Phi, DO [facility] [email or web address] Password [email or web address] 10/06/2018, 12:01 PM

## 2018-10-06 NOTE — Progress Notes (Signed)
[facility]ious [facility]  Date of Admission:  10/03/2018   Total days of antibiotics 2        Day 2, Ceftriaxone  ASSESSMENT: Mr. Knutzen is a 68 year old male who presented with altered mental status and hypoxemia (both of which have resolved). His blood cultures on 10/04/18 were positive for Klebsiella. This is a recurrent problem for him, he also had Klebsiella bacteremia in October and November of this year. No clear source found previously with negative TEE and R AVF U/S in November. He aquired C. Diff Colitis while hospitalized in November and remains on prolonged Vancomycin Taper due to ongoing diarrhea. MRI this admission noted a small right cerebellar infarct, raising concern for possible endocarditis and emboli. No Abscess or fluid collection on ultrasound of right AV Fistula; Some wall thickening and mild mural thrombus was noted. Patient has remained afebrile this admission and WBC has downtrended to normal. Patient does note ongoing left wrist pain since October, currently begin treated for gout.  PLAN:  1. Klebsiella Bacteremia - Continue Ceftriaxone - Blood Cultures 10/04/18: Klebsiella Pneumoniae, resistant to ampicillin - Repeat Blood Cultures 10/06/18: Pending - TEE pending, No vegetation on TTE - U/S of R AVF: No Abscess or fluid collection  2. C. Difficile Colitis - Continue PO Vancomycin taper   Principal Problem:   Gram-negative bacteremia Active Problems:   Cerebral embolism with cerebral infarction   Thyrotoxicosis   CAD S/P percutaneous coronary angioplasty   OSA (obstructive sleep apnea)   Hyperthyroidism   S/P right total knee replacement   DM (diabetes mellitus) type II controlled with renal manifestation ([name])   PAD (peripheral artery disease) ([name])   ESRD on hemodialysis ([place])   Essential hypertension   Chronic combined systolic (congestive) and diastolic (congestive) heart failure ([name])   Scheduled Meds: . allopurinol  100 mg Oral  Daily  . aspirin  325 mg Oral Daily  . Chlorhexidine Gluconate Cloth  6 each Topical Q0600  . cinacalcet  90 mg Oral BID WC  . citalopram  10 mg Oral QHS  . colchicine  0.6 mg Oral Daily  . colesevelam  1,875 mg Oral BID WC  . ezetimibe  10 mg Oral Daily  . feeding supplement (NEPRO CARB STEADY)  237 mL Oral BID BM  . ferric citrate  420 mg Oral TID WC  . gabapentin  100 mg Oral QHS  . heparin  5,000 Units Subcutaneous Q8H  . insulin aspart  0-9 Units Subcutaneous TID WC  . methimazole  2.5 mg Oral Daily  . multivitamin  1 tablet Oral QHS  . polyethylene glycol  17 g Oral Daily  . predniSONE  10 mg Oral BID WC  . sodium zirconium cyclosilicate  10 g Oral Once  . vancomycin  125 mg Oral Q48H   Followed by  . [START ON 10/08/2018] vancomycin  125 mg Oral Q72H   Continuous Infusions: . sodium chloride    . sodium chloride    . cefTRIAXone (ROCEPHIN)  IV 2 g (10/05/18 2259)   PRN Meds:.sodium chloride, sodium chloride, acetaminophen **OR** acetaminophen, alteplase, heparin, heparin, lidocaine (PF), lidocaine-prilocaine, ondansetron **OR** ondansetron (ZOFRAN) IV, pentafluoroprop-tetrafluoroeth   SUBJECTIVE: Patient states he feels okay today, but a little tired overall. He states his Left wrist pain has been ongoing since his admission in October.  Review of Systems: Review of Systems  Constitutional: Negative for chills and fever.  Eyes: Negative for blurred vision.  Respiratory: Negative for shortness of breath.   Cardiovascular: Negative for chest pain.  Gastrointestinal: Positive for diarrhea.  Genitourinary: Negative for dysuria.  Neurological: Negative for dizziness.       Generalized Weakness    Allergies  Allergen Reactions  . Cephalexin Swelling and Other (See Comments)    Tolerated Rocephin 10/19. Tongue swelling, but no breathing issues Tolerated Zosyn in Sept 2019 without any issues  . Statins Other (See Comments)    Weak muscles  . Ciprofloxacin Rash     OBJECTIVE: Vitals:   10/05/18 2037 10/05/18 2329 10/05/18 2334 10/06/18 0531  BP: 126/80 (!) 144/88  111/66  Pulse: 91 86  83  Resp: 18 16  18   Temp: 98.4 F (36.9 C) (!) 97.4 F (36.3 C)  97.7 F (36.5 C)  TempSrc: Oral Oral  Oral  SpO2: 96% 96% 96% 97%  Weight:    95.5 kg  Height:       Body mass index is 27.77 kg/m.  Physical Exam Constitutional:      General: He is not in acute distress.    Appearance: Normal appearance.  HENT:     Head: Normocephalic and atraumatic.     Mouth/Throat:     Mouth: Mucous membranes are moist.     Pharynx: Oropharynx is clear. No posterior oropharyngeal erythema.     Comments: Poor Dentition, No tenderness Cardiovascular:     Rate and Rhythm: Normal rate and regular rhythm.     Pulses: Normal pulses.     Heart sounds: Normal heart sounds.  Pulmonary:     Effort: Pulmonary effort is normal. No respiratory distress.     Breath sounds: Normal breath sounds.  Abdominal:     General: Bowel sounds are normal. There is no distension.     Palpations: Abdomen is soft.     Tenderness: There is no abdominal tenderness.  Musculoskeletal:        General: No swelling or deformity.     Comments: Mild left wrist tenderness to palpation  Skin:    General: Skin is warm and dry.     Lab Results Lab Results  Component Value Date   WBC 8.8 10/06/2018   HGB 12.3 (L) 10/06/2018   HCT 39.5 10/06/2018   MCV 93.6 10/06/2018   PLT 284 10/06/2018    Lab Results  Component Value Date   CREATININE 9.28 (H) 10/06/2018   BUN 50 (H) 10/06/2018   NA 136 10/06/2018   K 4.9 10/06/2018   CL 96 (L) 10/06/2018   CO2 25 10/06/2018    Lab Results  Component Value Date   ALT 165 (H) 10/06/2018   AST 50 (H) 10/06/2018   ALKPHOS 193 (H) 10/06/2018   BILITOT 0.7 10/06/2018     Microbiology: Recent Results (from the past 240 hour(s))  Culture, blood (routine x 2)     Status: Abnormal (Preliminary result)   Collection Time: 10/04/18 12:01 AM   Result Value Ref Range Status   Specimen Description BLOOD LEFT ANTECUBITAL  Final   Special Requests   Final    BOTTLES DRAWN AEROBIC AND ANAEROBIC Blood Culture results may not be optimal due to an inadequate volume of blood received in culture bottles   Culture  Setup Time   Final    GRAM NEGATIVE RODS IN BOTH AEROBIC AND ANAEROBIC BOTTLES CRITICAL RESULT CALLED TO, READ BACK BY AND VERIFIED WITH: Earnest Rosier [phone] FCP Performed at [facility], [place],   [address]    Culture (A)  Final    KLEBSIELLA PNEUMONIAE SUSCEPTIBILITIES PERFORMED ON PREVIOUS CULTURE WITHIN THE LAST 5 DAYS.    Report Status PENDING  Incomplete  Blood Culture ID Panel (Reflexed)     Status: Abnormal   Collection Time: 10/04/18 12:01 AM  Result Value Ref Range Status   Enterococcus species NOT DETECTED NOT DETECTED Final   Listeria monocytogenes NOT DETECTED NOT DETECTED Final   Staphylococcus species NOT DETECTED NOT DETECTED Final   Staphylococcus aureus (BCID) NOT DETECTED NOT DETECTED Final   Streptococcus species NOT DETECTED NOT DETECTED Final   Streptococcus agalactiae NOT DETECTED NOT DETECTED Final   Streptococcus pneumoniae NOT DETECTED NOT DETECTED Final   Streptococcus pyogenes NOT DETECTED NOT DETECTED Final   Acinetobacter baumannii NOT DETECTED NOT DETECTED Final   Enterobacteriaceae species DETECTED (A) NOT DETECTED Final    Comment: Enterobacteriaceae represent a large family of gram-negative bacteria, not a single organism. CRITICAL RESULT CALLED TO, READ BACK BY AND VERIFIED WITH: [place] [phone] FCP    Enterobacter cloacae complex NOT DETECTED NOT DETECTED Final   Escherichia coli NOT DETECTED NOT DETECTED Final   Klebsiella oxytoca NOT DETECTED NOT DETECTED Final   Klebsiella pneumoniae DETECTED (A) NOT DETECTED Final    Comment: CRITICAL RESULT CALLED TO, READ BACK BY AND VERIFIED WITH: PHARMD ANDREW MEYER [phone] FCP     Proteus species NOT DETECTED NOT DETECTED Final   Serratia marcescens NOT DETECTED NOT DETECTED Final   Carbapenem resistance NOT DETECTED NOT DETECTED Final   Haemophilus influenzae NOT DETECTED NOT DETECTED Final   Neisseria meningitidis NOT DETECTED NOT DETECTED Final   Pseudomonas aeruginosa NOT DETECTED NOT DETECTED Final   Candida albicans NOT DETECTED NOT DETECTED Final   Candida glabrata NOT DETECTED NOT DETECTED Final   Candida krusei NOT DETECTED NOT DETECTED Final   Candida parapsilosis NOT DETECTED NOT DETECTED Final   Candida tropicalis NOT DETECTED NOT DETECTED Final  Culture, blood (routine x 2)     Status: Abnormal (Preliminary result)   Collection Time: 10/04/18 12:07 AM  Result Value Ref Range Status   Specimen Description BLOOD LEFT FOREARM  Final   Special Requests   Final    BOTTLES DRAWN AEROBIC AND ANAEROBIC Blood Culture results may not be optimal due to an inadequate volume of blood received in culture bottles   Culture  Setup Time   Final    GRAM NEGATIVE RODS IN BOTH AEROBIC AND ANAEROBIC BOTTLES CRITICAL VALUE NOTED.  VALUE IS CONSISTENT WITH PREVIOUSLY REPORTED AND CALLED VALUE.    Culture KLEBSIELLA PNEUMONIAE (A)  Final   Report Status PENDING  Incomplete   Organism ID, Bacteria KLEBSIELLA PNEUMONIAE  Final      Susceptibility   Klebsiella pneumoniae - MIC*    AMPICILLIN >=32 RESISTANT Resistant     CEFAZOLIN <=4 SENSITIVE Sensitive     CEFEPIME <=1 SENSITIVE Sensitive     CEFTAZIDIME <=1 SENSITIVE Sensitive     CEFTRIAXONE <=1 SENSITIVE Sensitive     CIPROFLOXACIN <=0.25 SENSITIVE Sensitive     GENTAMICIN <=1 SENSITIVE Sensitive     IMIPENEM <=0.25 SENSITIVE Sensitive     TRIMETH/SULFA <=20 SENSITIVE Sensitive     AMPICILLIN/SULBACTAM 8 SENSITIVE Sensitive     PIP/TAZO 16 SENSITIVE Sensitive     Extended ESBL NEGATIVE Sensitive     * KLEBSIELLA PNEUMONIAE  MRSA PCR Screening     Status: None   Collection Time: 10/04/18  6:06 PM  Result  Value Ref Range Status   MRSA by PCR NEGATIVE NEGATIVE Final    Comment:        The GeneXpert MRSA Assay (FDA approved for NASAL specimens only), is one component of a comprehensive MRSA colonization surveillance program. It is not intended to diagnose MRSA infection nor to guide or monitor treatment for MRSA infections. Performed at [facility], [place] [address] [place], [place] [address]     Neva Seat, MD IM PGY-2 [phone] 10/06/2018, 10:46 AM

## 2018-10-06 NOTE — Progress Notes (Signed)
Attempted to complete EEG, RN explained that patient was getting ready to be taking for a cardiac cath. Will try again later when schedule permits.

## 2018-10-06 NOTE — Progress Notes (Signed)
Pt wears CPAP at night and states that he himself places it on. RT added H2O in chamber and placed in pts reach. Educated pt to please call if he needed anything. Pt understood. RT to cont to monitor.

## 2018-10-06 NOTE — Progress Notes (Signed)
[facility] PROGRESS NOTE  SUBJECTIVE: no new c/o today   OBJECTIVE:  Vitals:   10/06/18 0531 10/06/18 1228  BP: 111/66 121/73  Pulse: 83 78  Resp: 18 18  Temp: 97.7 F (36.5 C) 98.1 F (36.7 C)  SpO2: 97% 96%   I/O last 3 completed shifts: In: 809.4 [P.O.:714; IV Piggyback:95.4] Out: 1841 [Other:1841]   Exam: [place]:  AAOx3 NAD HEENT: MMM [place] AT anicteric sclera Neck:  No JVD, no adenopathy CV:  Heart RRR  Lungs:  L/S CTA bilaterally Abd:  abd SNT/ND with normal BS GU:  Bladder non-palpable Extremities:  No LE edema.  Right upper extremity fistula with good thrill and bruit Skin:  No skin rash  Dialysis: SW TTS  4h  2/2.25 bath  Hep none  P2  RUA AVF   Dry wt 98.5kg in Dec  meds?  A/ P: 1.  ESRD on HD: HD on schedule tomorrow.   2.  C. difficile.  Remains on vancomycin.  3.  Type 2 diabetes.  Continue outpatient treatment.  4.  Anemia of CKD: Hb up will hold on any inpatient erythropoietin therapy.  5.  Gout: He complains of left ankle and left wrist pain.  Continued prednisone, allopurinol, and colchicine  6.  Recur Klebs bacteremia: w/u in progress          MEDICATIONS:   Current Facility-Administered Medications:  .  0.9 %  sodium chloride infusion, 100 mL, Intravenous, PRN, Finnigan, Nancy A, DO .  0.9 %  sodium chloride infusion, 100 mL, Intravenous, PRN, Finnigan, Nancy A, DO .  acetaminophen (TYLENOL) tablet 650 mg, 650 mg, Oral, Q6H PRN, 650 mg at 10/05/18 0926 **OR** acetaminophen (TYLENOL) suppository 650 mg, 650 mg, Rectal, Q6H PRN, Rise Patience, MD .  allopurinol (ZYLOPRIM) tablet 100 mg, 100 mg, Oral, Daily, Rise Patience, MD, 100 mg at 10/06/18 1118 .  alteplase (CATHFLO ACTIVASE) injection 2 mg, 2 mg, Intracatheter, Once PRN, Finnigan, Nancy A, DO .  aspirin tablet 325 mg, 325 mg, Oral, Daily, Lilyan Gilford I, NP, 325 mg at 10/06/18 1117 .  cefTRIAXone (ROCEPHIN) 2 g in sodium chloride 0.9 % 100 mL  IVPB, 2 g, Intravenous, Q24H, Rise Patience, MD, Last Rate: 200 mL/hr at 10/05/18 2259, 2 g at 10/05/18 2259 .  Chlorhexidine Gluconate Cloth 2 % PADS 6 each, 6 each, Topical, Q0600, Finnigan, Nancy A, DO .  cinacalcet (SENSIPAR) tablet 90 mg, 90 mg, Oral, BID WC, Rise Patience, MD, 90 mg at 10/06/18 1117 .  citalopram (CELEXA) tablet 10 mg, 10 mg, Oral, QHS, Rise Patience, MD, 10 mg at 10/05/18 2249 .  colchicine tablet 0.6 mg, 0.6 mg, Oral, Daily, Rise Patience, MD, 0.6 mg at 10/06/18 1117 .  colesevelam [facility]) tablet 1,875 mg, 1,875 mg, Oral, BID WC, Rise Patience, MD, 1,875 mg at 10/06/18 1117 .  ezetimibe (ZETIA) tablet 10 mg, 10 mg, Oral, Daily, Rise Patience, MD, 10 mg at 10/06/18 1120 .  feeding supplement (NEPRO CARB STEADY) liquid 237 mL, 237 mL, Oral, BID BM, Rise Patience, MD, 237 mL at 10/06/18 1119 .  ferric citrate (AURYXIA) tablet 420 mg, 420 mg, Oral, TID WC, Rise Patience, MD, 420 mg at 10/06/18 1118 .  gabapentin (NEURONTIN) capsule 100 mg, 100 mg, Oral, QHS, Rise Patience, MD, 100 mg at 10/05/18 2248 .  heparin injection 1,000 Units, 1,000 Units, Dialysis, PRN, Finnigan, Nancy A, DO .  heparin injection 1,000 Units,  1,000 Units, Dialysis, PRN, Finnigan, Nancy A, DO .  heparin injection 5,000 Units, 5,000 Units, Subcutaneous, Q8H, Rise Patience, MD, 5,000 Units at 10/06/18 [phone]  insulin aspart (novoLOG) injection 0-9 Units, 0-9 Units, Subcutaneous, TID WC, Rise Patience, MD, 1 Units at 10/06/18 1158 .  lidocaine (PF) (XYLOCAINE) 1 % injection 5 mL, 5 mL, Intradermal, PRN, Finnigan, Nancy A, DO .  lidocaine-prilocaine (EMLA) cream 1 application, 1 application, Topical, PRN, Finnigan, Nancy A, DO .  methimazole (TAPAZOLE) tablet 2.5 mg, 2.5 mg, Oral, Daily, Rise Patience, MD, 2.5 mg at 10/06/18 1118 .  multivitamin (RENA-VIT) tablet 1 tablet, 1 tablet, Oral, QHS, Rise Patience, MD, 1  tablet at 10/05/18 2249 .  ondansetron (ZOFRAN) tablet 4 mg, 4 mg, Oral, Q6H PRN, 4 mg at 10/04/18 0751 **OR** ondansetron (ZOFRAN) injection 4 mg, 4 mg, Intravenous, Q6H PRN, Rise Patience, MD .  pentafluoroprop-tetrafluoroeth (GEBAUERS) aerosol 1 application, 1 application, Topical, PRN, Finnigan, Nancy A, DO .  polyethylene glycol (MIRALAX / GLYCOLAX) packet 17 g, 17 g, Oral, Daily, Gean Birchwood N, MD .  predniSONE (DELTASONE) tablet 10 mg, 10 mg, Oral, BID WC, Rise Patience, MD, 10 mg at 10/06/18 1119 .  sodium zirconium cyclosilicate (LOKELMA) packet 10 g, 10 g, Oral, Once, Dessa Phi, DO .  vancomycin (VANCOCIN) 50 mg/mL oral solution 125 mg, 125 mg, Oral, Q48H, 125 mg at 10/04/18 1252 **FOLLOWED BY** [START ON 10/08/2018] vancomycin (VANCOCIN) 50 mg/mL oral solution 125 mg, 125 mg, Oral, Q72H, Dessa Phi, DO     LABS:  CBC Latest Ref Rng & Units 10/06/2018 10/05/2018 10/04/2018  WBC 4.0 - 10.5 K/uL 8.8 10.9(H) 16.7(H)  Hemoglobin 13.0 - 17.0 g/dL 12.3(L) 12.0(L) 11.9(L)  Hematocrit 39.0 - 52.0 % 39.5 38.8(L) 39.5  Platelets 150 - 400 K/uL 284 238 285    CMP Latest Ref Rng & Units 10/06/2018 10/05/2018 10/04/2018  Glucose 70 - 99 mg/dL 149(H) 146(H) 176(H)  BUN 8 - 23 mg/dL 50(H) 28(H) 47(H)  Creatinine 0.61 - 1.24 mg/dL 9.28(H) 6.80(H) 10.43(H)  Sodium 135 - 145 mmol/L 136 136 138  Potassium 3.5 - 5.1 mmol/L 4.9 5.8(H) 6.1(H)  Chloride 98 - 111 mmol/L 96(L) 94(L) 98  CO2 22 - 32 mmol/L 25 26 26   Calcium 8.9 - 10.3 mg/dL 9.1 9.0 9.3  Total Protein 6.5 - 8.1 g/dL 7.8 7.5 -  Total Bilirubin 0.3 - 1.2 mg/dL 0.7 1.6(H) -  Alkaline Phos 38 - 126 U/L 193(H) 222(H) -  AST 15 - 41 U/L 50(H) 156(H) -  ALT 0 - 44 U/L 165(H) 272(H) -    Lab Results  Component Value Date   CALCIUM 9.1 10/06/2018   CAION 0.95 (L) 08/19/2018   PHOS 5.1 (H) 10/06/2018       Component Value Date/Time   COLORURINE LT. YELLOW 06/26/2013 1200   APPEARANCEUR CLEAR 06/26/2013 1200   LABSPEC  1.020 06/26/2013 1200   PHURINE 8.5 06/26/2013 1200   GLUCOSEU 100 06/26/2013 1200   HGBUR MODERATE 06/26/2013 1200   BILIRUBINUR NEGATIVE 06/26/2013 1200   KETONESUR NEGATIVE 06/26/2013 1200   PROTEINUR >300 (A) 01/04/2010 1032   UROBILINOGEN 0.2 06/26/2013 1200   NITRITE NEGATIVE 06/26/2013 1200   LEUKOCYTESUR TRACE 06/26/2013 1200      Component Value Date/Time   PHART 7.410 08/20/2018 1113   PCO2ART 32.7 08/20/2018 1113   PO2ART 82.3 (L) 08/20/2018 1113   HCO3 19.7 (L) 08/20/2018 1113   TCO2 28 08/19/2018 1716   ACIDBASEDEF  3.7 (H) 08/20/2018 1113   O2SAT 94.3 08/20/2018 1113       Component Value Date/Time   IRON 16 (L) 06/12/2018 0500   TIBC 168 (L) 06/12/2018 0500   FERRITIN 709 (H) 10/17/2016 1103   IRONPCTSAT 10 (L) 06/12/2018 0500       ASSESSMENT/PLAN:     Problem List Items Addressed This Visit    Acute hepatitis    Other Visit Diagnoses    Hypoxia    -  Primary   Confusion       End-stage renal disease on hemodialysis ([name])       Anemia associated with chronic renal failure       Elevated lactic acid level       Hepatitis       Relevant Orders   US Abdomen Limited RUQ (Completed)   Bacteremia       Relevant Orders   [place] RT UPPER EXTREM LTD SOFT TISSUE NON VASCULAR (Completed)

## 2018-10-06 NOTE — Procedures (Signed)
ELECTROENCEPHALOGRAM REPORT   Patient: [name]       Room #: 3E30C EEG No. ID: [id number] Age: 68 y.o.        Sex: male Referring Physician: Leonie Man Report Date:  10/06/2018        Interpreting Physician: Alexis Goodell  History: [name] is an 68 y.o. male with altered mental status  Medications:  ASA, Allopurinol, Rocephin, Celexa, Colchicine, Zetia, Welchol, Neurontin, Insulin, Tapazole, MVI, Vancomycin, Prednisone  Conditions of Recording:  This is a 21 channel routine scalp EEG performed with bipolar and monopolar montages arranged in accordance to the international 10/20 system of electrode placement. One channel was dedicated to EKG recording.  The patient is in the awake and drowsy states.  Description:  The waking background activity consists of a low voltage, symmetrical, fairly well organized, 8 Hz alpha activity, seen from the parieto-occipital and posterior temporal regions.  Low voltage fast activity, poorly organized, is seen anteriorly and is at times superimposed on more posterior regions.  A mixture of theta and alpha rhythms are seen from the central and temporal regions. The patient drowses with slowing to irregular, low voltage theta and beta activity.   Stage II sleep is not obtained. No epileptiform activity is noted.   Hyperventilation and intermittent photic stimulation were not performed.  IMPRESSION: Normal electroencephalogram, awake and drowsy. There are no focal lateralizing or epileptiform features.   Alexis Goodell, MD Neurology [phone] 10/06/2018, 5:36 PM

## 2018-10-07 ENCOUNTER — Other Ambulatory Visit (HOSPITAL_COMMUNITY): Payer: Medicare Other

## 2018-10-07 LAB — CULTURE, BLOOD (ROUTINE X 2)

## 2018-10-07 LAB — CBC
HCT: 38.3 % — ABNORMAL LOW (ref 39.0–52.0)
HCT: 38.6 % — ABNORMAL LOW (ref 39.0–52.0)
Hemoglobin: 12 g/dL — ABNORMAL LOW (ref 13.0–17.0)
Hemoglobin: 11.6 g/dL — ABNORMAL LOW (ref 13.0–17.0)
MCH: 29.3 pg (ref 26.0–34.0)
MCH: 28.1 pg (ref 26.0–34.0)
MCHC: 31.3 g/dL (ref 30.0–36.0)
MCHC: 30.1 g/dL (ref 30.0–36.0)
MCV: 93.4 fL (ref 80.0–100.0)
MCV: 93.5 fL (ref 80.0–100.0)
Platelets: 280 10*3/uL (ref 150–400)
Platelets: 279 10*3/uL (ref 150–400)
RBC: 4.1 MIL/uL — AB (ref 4.22–5.81)
RBC: 4.13 MIL/uL — ABNORMAL LOW (ref 4.22–5.81)
RDW: 17.7 % — ABNORMAL HIGH (ref 11.5–15.5)
RDW: 17.9 % — ABNORMAL HIGH (ref 11.5–15.5)
WBC: 7.4 10*3/uL (ref 4.0–10.5)
WBC: 7.6 10*3/uL (ref 4.0–10.5)
nRBC: 0 % (ref 0.0–0.2)
nRBC: 0 % (ref 0.0–0.2)

## 2018-10-07 LAB — BASIC METABOLIC PANEL
Anion gap: 18 — ABNORMAL HIGH (ref 5–15)
BUN: 73 mg/dL — ABNORMAL HIGH (ref 8–23)
CO2: 24 mmol/L (ref 22–32)
Calcium: 8.5 mg/dL — ABNORMAL LOW (ref 8.9–10.3)
Chloride: 97 mmol/L — ABNORMAL LOW (ref 98–111)
Creatinine, Ser: 11.48 mg/dL — ABNORMAL HIGH (ref 0.61–1.24)
GFR calc Af Amer: 5 mL/min — ABNORMAL LOW (ref >60)
GFR calc non Af Amer: 4 mL/min — ABNORMAL LOW (ref >60)
Glucose, Bld: 144 mg/dL — ABNORMAL HIGH (ref 70–99)
Potassium: 5.4 mmol/L — ABNORMAL HIGH (ref 3.5–5.1)
Sodium: 139 mmol/L (ref 135–145)

## 2018-10-07 LAB — GLUCOSE, CAPILLARY
GLUCOSE-CAPILLARY: 141 mg/dL — AB (ref 70–99)
Glucose-Capillary: 96 mg/dL (ref 70–99)
Glucose-Capillary: 184 mg/dL — ABNORMAL HIGH (ref 70–99)

## 2018-10-07 LAB — HEPATITIS PANEL, ACUTE
HCV Ab: 0.2 s/co ratio (ref 0.0–0.9)
HEP B C IGM: NEGATIVE
HEP B S AG: NEGATIVE
Hep A IgM: NEGATIVE

## 2018-10-07 NOTE — Progress Notes (Signed)
Antibiotic History for Klebsiella Bacteremia   Original positive culture 07/16/18- 1/2 sets positive ( R to Amp only)  Treated with:  Vanc/Zosyn 10/17-10/18/19 Ceftriaxone 10/18-10/22/19 Cefdinir 10/22-11/1/19   Second positive culture: 08/19/18-2/2 sets positive (R to Amp and Unasyn) Treated with: Meropenem 11/20-11/22/19 Ceftriaxone  11/22-11/29/19 Ceftazidime 11/20- 09/04/18  11/25 TEE negative  12/24 left wrist aspirate: No growth     Third positive culture: 10/04/2018-2/2 sets positive ( R to Amp)  1/4 Ceftriaxone >>>   Jimmy Footman, PharmD, BCPS, [facility] [facility] Clinical Pharmacist Phone: [phone]

## 2018-10-07 NOTE — Progress Notes (Signed)
Educated the patient on the proper use of the machine and how to take mask on and off. Patient had questions about the functioning of the mask, questions were answered and demonstrated on the proper use of it. Patient states that he will wear his CPAP/NIV mask once he is ready for bed. O2 humidity provided. Patient is stable at this time.

## 2018-10-07 NOTE — Progress Notes (Signed)
Pt scheduled for TEE today at 1100, but is currently in hemodialysis and will be until after 1200. Spoke with Wannetta Sender who is ok with rescheduling patient until 10/08/17 at 1000. Pt's nurse in room 3E30 made aware. Jobe Igo, RN

## 2018-10-07 NOTE — Progress Notes (Signed)
PT Cancellation Note  Patient Details Name: [name] MRN: [id number] DOB: 11-14-1950   Cancelled Treatment:    Reason Eval/Treat Not Completed: Patient at procedure or test/unavailable pt in HD. Will follow up as time allows.   Marguarite Arbour A [name] 10/07/2018, 10:44 AM Wray Kearns, PT, DPT Acute Rehabilitation Services Pager [phone] Office [phone]

## 2018-10-07 NOTE — Progress Notes (Addendum)
[facility] Infectious Disease  Date of Admission:  10/03/2018   Total days of antibiotics 2        Day 2, Ceftriaxone  ASSESSMENT: [name] is a 68 year old male who presented with altered mental status and hypoxemia (both of which have resolved). His blood cultures on 10/04/18 were positive for Klebsiella. This is a recurrent problem for him, he also had Klebsiella bacteremia in October and November of this year. No clear source found previously with negative TEE and R AVF U/S in November. He aquired C. Diff Colitis while hospitalized in November and remains on prolonged Vancomycin Taper due to ongoing diarrhea. MRI this admission noted a small right cerebellar infarct, raising concern for possible endocarditis and emboli. No Abscess or fluid collection on ultrasound of right AV Fistula; Some wall thickening and mild mural thrombus was noted. Patient has remained afebrile this admission and WBC has downtrended to normal. Patient does note ongoing left wrist pain since October, currently begin treated for gout. He had an aspiration on 12/13, which showed no growth (His las dose of antibiotic from preceding infection was on 12/5).   PLAN:  1. Klebsiella Bacteremia - Continue Ceftriaxone - Blood Cultures 10/04/18: Klebsiella Pneumoniae, resistant to ampicillin - Repeat Blood Cultures 10/06/18: NGTD - U/S of R AVF: No Abscess or fluid collection - No vegetation on TTE - Given gram negative bacteremia and recent negative TEE, repeat TEE not be needed at this time  2. C. Difficile Colitis - Continue PO Vancomycin taper   Principal Problem:   Gram-negative bacteremia Active Problems:   C. difficile colitis   Cerebral embolism with cerebral infarction   Thyrotoxicosis   CAD S/P percutaneous coronary angioplasty   OSA (obstructive sleep apnea)   Hyperthyroidism   S/P right total knee replacement   DM (diabetes mellitus) type II controlled with renal manifestation ([name])   PAD  (peripheral artery disease) ([name])   ESRD on hemodialysis ([place])   Essential hypertension   Chronic combined systolic (congestive) and diastolic (congestive) heart failure ([name])   Scheduled Meds: . allopurinol  100 mg Oral Daily  . aspirin  325 mg Oral Daily  . Chlorhexidine Gluconate Cloth  6 each Topical Q0600  . Chlorhexidine Gluconate Cloth  6 each Topical Q0600  . cinacalcet  90 mg Oral BID WC  . citalopram  10 mg Oral QHS  . colchicine  0.6 mg Oral Daily  . colesevelam  1,875 mg Oral BID WC  . ezetimibe  10 mg Oral Daily  . feeding supplement (NEPRO CARB STEADY)  237 mL Oral BID BM  . ferric citrate  420 mg Oral TID WC  . gabapentin  100 mg Oral QHS  . heparin  5,000 Units Subcutaneous Q8H  . insulin aspart  0-9 Units Subcutaneous TID WC  . methimazole  2.5 mg Oral Daily  . multivitamin  1 tablet Oral QHS  . polyethylene glycol  17 g Oral Daily  . predniSONE  10 mg Oral BID WC  . sodium zirconium cyclosilicate  10 g Oral Once  . [START ON 10/08/2018] vancomycin  125 mg Oral Q72H   Continuous Infusions: . sodium chloride    . sodium chloride    . cefTRIAXone (ROCEPHIN)  IV 2 g (10/06/18 2218)   PRN Meds:.sodium chloride, sodium chloride, acetaminophen **OR** acetaminophen, alteplase, heparin, heparin, lidocaine (PF), lidocaine-prilocaine, ondansetron **OR** ondansetron (ZOFRAN) IV, pentafluoroprop-tetrafluoroeth   SUBJECTIVE: Patient states he feels okay today.  He states he continues to feel generally weak, but has been able to get up and walk down the hall some. He continues to have watery diarrhea, but states this seems to be improving somewhat. He expressed frustration about the repeated recurrence of his bloodstream infection and was assured that we are continuing to search for a source. He states his left wrist tenderness is improving today.  Review of Systems: Review of Systems  Constitutional: Negative for chills and fever.  Eyes: Negative for blurred vision.    Respiratory: Negative for shortness of breath.   Cardiovascular: Negative for chest pain.  Gastrointestinal: Positive for diarrhea.  Genitourinary: Negative for dysuria.  Neurological: Negative for dizziness.       Generalized Weakness    Allergies  Allergen Reactions  . Cephalexin Swelling and Other (See Comments)    Tolerated Rocephin 10/19. Tongue swelling, but no breathing issues Tolerated Zosyn in Sept 2019 without any issues  . Statins Other (See Comments)    Weak muscles  . Ciprofloxacin Rash    OBJECTIVE: Vitals:   10/07/18 1030 10/07/18 1045 10/07/18 1100 10/07/18 1115  BP: (!) 144/88 119/73 134/88 133/89  Pulse: 74 65 65 66  Resp: 16 16 16 16   Temp:      TempSrc:      SpO2:      Weight:      Height:       Body mass index is 29.82 kg/m.  Physical Exam Constitutional:      General: He is not in acute distress.    Appearance: Normal appearance.  HENT:     Head: Normocephalic and atraumatic.     Mouth/Throat:     Mouth: Mucous membranes are moist.     Pharynx: Oropharynx is clear. No posterior oropharyngeal erythema.     Comments: Poor Dentition, No tenderness Cardiovascular:     Rate and Rhythm: Normal rate and regular rhythm.     Pulses: Normal pulses.     Heart sounds: Normal heart sounds.  Pulmonary:     Effort: Pulmonary effort is normal. No respiratory distress.     Breath sounds: Normal breath sounds.  Abdominal:     General: Bowel sounds are normal. There is no distension.     Palpations: Abdomen is soft.     Tenderness: There is no abdominal tenderness.  Musculoskeletal:        General: No swelling or deformity.     Comments: Mild left wrist tenderness to palpation  Skin:    General: Skin is warm and dry.     Lab Results Lab Results  Component Value Date   WBC 7.6 10/07/2018   HGB 11.6 (L) 10/07/2018   HCT 38.6 (L) 10/07/2018   MCV 93.5 10/07/2018   PLT 279 10/07/2018    Lab Results  Component Value Date   CREATININE 11.48 (H)  10/07/2018   BUN 73 (H) 10/07/2018   NA 139 10/07/2018   K 5.4 (H) 10/07/2018   CL 97 (L) 10/07/2018   CO2 24 10/07/2018    Lab Results  Component Value Date   ALT 165 (H) 10/06/2018   AST 50 (H) 10/06/2018   ALKPHOS 193 (H) 10/06/2018   BILITOT 0.7 10/06/2018     Microbiology: Recent Results (from the past 240 hour(s))  Culture, blood (routine x 2)     Status: Abnormal   Collection Time: 10/04/18 12:01 AM  Result Value Ref Range Status   Specimen Description BLOOD LEFT ANTECUBITAL  Final   Special  Requests   Final    BOTTLES DRAWN AEROBIC AND ANAEROBIC Blood Culture results may not be optimal due to an inadequate volume of blood received in culture bottles   Culture  Setup Time   Final    GRAM NEGATIVE RODS IN BOTH AEROBIC AND ANAEROBIC BOTTLES CRITICAL RESULT CALLED TO, READ BACK BY AND VERIFIED WITH: Earnest Rosier [phone] FCP Performed at [facility] [address] [place], [place] [address]    Culture (A)  Final    KLEBSIELLA PNEUMONIAE SUSCEPTIBILITIES PERFORMED ON PREVIOUS CULTURE WITHIN THE LAST 5 DAYS.    Report Status 10/07/2018 FINAL  Final  Blood Culture ID Panel (Reflexed)     Status: Abnormal   Collection Time: 10/04/18 12:01 AM  Result Value Ref Range Status   Enterococcus species NOT DETECTED NOT DETECTED Final   Listeria monocytogenes NOT DETECTED NOT DETECTED Final   Staphylococcus species NOT DETECTED NOT DETECTED Final   Staphylococcus aureus (BCID) NOT DETECTED NOT DETECTED Final   Streptococcus species NOT DETECTED NOT DETECTED Final   Streptococcus agalactiae NOT DETECTED NOT DETECTED Final   Streptococcus pneumoniae NOT DETECTED NOT DETECTED Final   Streptococcus pyogenes NOT DETECTED NOT DETECTED Final   Acinetobacter baumannii NOT DETECTED NOT DETECTED Final   Enterobacteriaceae species DETECTED (A) NOT DETECTED Final    Comment: Enterobacteriaceae represent a large family of gram-negative bacteria, not a single  organism. CRITICAL RESULT CALLED TO, READ BACK BY AND VERIFIED WITH: [place] [phone] FCP    Enterobacter cloacae complex NOT DETECTED NOT DETECTED Final   Escherichia coli NOT DETECTED NOT DETECTED Final   Klebsiella oxytoca NOT DETECTED NOT DETECTED Final   Klebsiella pneumoniae DETECTED (A) NOT DETECTED Final    Comment: CRITICAL RESULT CALLED TO, READ BACK BY AND VERIFIED WITH: PHARMD ANDREW MEYER [phone] FCP    Proteus species NOT DETECTED NOT DETECTED Final   Serratia marcescens NOT DETECTED NOT DETECTED Final   Carbapenem resistance NOT DETECTED NOT DETECTED Final   Haemophilus influenzae NOT DETECTED NOT DETECTED Final   Neisseria meningitidis NOT DETECTED NOT DETECTED Final   Pseudomonas aeruginosa NOT DETECTED NOT DETECTED Final   Candida albicans NOT DETECTED NOT DETECTED Final   Candida glabrata NOT DETECTED NOT DETECTED Final   Candida krusei NOT DETECTED NOT DETECTED Final   Candida parapsilosis NOT DETECTED NOT DETECTED Final   Candida tropicalis NOT DETECTED NOT DETECTED Final  Culture, blood (routine x 2)     Status: Abnormal   Collection Time: 10/04/18 12:07 AM  Result Value Ref Range Status   Specimen Description BLOOD LEFT FOREARM  Final   Special Requests   Final    BOTTLES DRAWN AEROBIC AND ANAEROBIC Blood Culture results may not be optimal due to an inadequate volume of blood received in culture bottles Performed at [facility], [address] [place], [place] [address]    Culture  Setup Time   Final    GRAM NEGATIVE RODS IN BOTH AEROBIC AND ANAEROBIC BOTTLES CRITICAL VALUE NOTED.  VALUE IS CONSISTENT WITH PREVIOUSLY REPORTED AND CALLED VALUE.    Culture KLEBSIELLA PNEUMONIAE (A)  Final   Report Status 10/07/2018 FINAL  Final   Organism ID, Bacteria KLEBSIELLA PNEUMONIAE  Final      Susceptibility   Klebsiella pneumoniae - MIC*    AMPICILLIN >=32 RESISTANT Resistant     CEFAZOLIN <=4 SENSITIVE Sensitive     CEFEPIME <=1  SENSITIVE Sensitive     CEFTAZIDIME <=  1 SENSITIVE Sensitive     CEFTRIAXONE <=1 SENSITIVE Sensitive     CIPROFLOXACIN <=0.25 SENSITIVE Sensitive     GENTAMICIN <=1 SENSITIVE Sensitive     IMIPENEM <=0.25 SENSITIVE Sensitive     TRIMETH/SULFA <=20 SENSITIVE Sensitive     AMPICILLIN/SULBACTAM 8 SENSITIVE Sensitive     PIP/TAZO 16 SENSITIVE Sensitive     Extended ESBL NEGATIVE Sensitive     * KLEBSIELLA PNEUMONIAE  MRSA PCR Screening     Status: None   Collection Time: 10/04/18  6:06 PM  Result Value Ref Range Status   MRSA by PCR NEGATIVE NEGATIVE Final    Comment:        The GeneXpert MRSA Assay (FDA approved for NASAL specimens only), is one component of a comprehensive MRSA colonization surveillance program. It is not intended to diagnose MRSA infection nor to guide or monitor treatment for MRSA infections. Performed at [facility], [place] [address], [place], [place] [address]   Culture, blood (Routine X 2) w Reflex to ID Panel     Status: None (Preliminary result)   Collection Time: 10/06/18  5:35 AM  Result Value Ref Range Status   Specimen Description BLOOD LEFT HAND  Final   Special Requests   Final    BOTTLES DRAWN AEROBIC ONLY Blood Culture adequate volume   Culture   Final    NO GROWTH 1 DAY Performed at [facility], [place] [address], [place], [place] [address]    Report Status PENDING  Incomplete    Neva Seat, MD IM PGY-2 [phone] 10/07/2018, 11:31 AM

## 2018-10-07 NOTE — Plan of Care (Signed)
  Problem: Activity: Goal: Risk for activity intolerance will decrease Outcome: Progressing  Pt. States he's still weak, but feels a little better.  Problem: Safety: Goal: Ability to remain free from injury will improve Outcome: Progressing

## 2018-10-07 NOTE — Progress Notes (Signed)
PROGRESS NOTE    [name]  ZOX:[id number] DOB: 1951-02-12 DOA: 10/03/2018 PCP: Biagio Borg, MD     Brief Narrative:  [name] a 68 y.o.malewithESRD on hemodialysis on Tuesday Thursday Saturday, CAD, hypothyroidism, gout, recently admitted for sepsis and has had multiple episodes of bacteremia and also recently treated for C. difficile still on vancomycin was not feeling well for last couple of days feeling tired and weak. Last evening patient found it difficult to walk and had to rest on his bed following which patient wife found that patient was very confused and had a bowel movement. After that patient was difficult to make him walk and EMS was called. Patient remained confused at least for an hour or 2. Prior to which patient also had some chest pressure. Patient wife states that patient has had left wrist gout attack and has started back on prednisone yesterday which patient took first dose.   After admission, blood culture returned with Klebsiella pneumonia.  New events last 24 hours / Subjective: Seen in dialysis, no complaints today.  Assessment & Plan:   Principal Problem:   Gram-negative bacteremia Active Problems:   Thyrotoxicosis   CAD S/P percutaneous coronary angioplasty   OSA (obstructive sleep apnea)   Hyperthyroidism   S/P right total knee replacement   DM (diabetes mellitus) type II controlled with renal manifestation ([name])   PAD (peripheral artery disease) ([name])   ESRD on hemodialysis ([place])   Essential hypertension   Chronic combined systolic (congestive) and diastolic (congestive) heart failure ([name])   C. difficile colitis   Cerebral embolism with cerebral infarction   Recurrent Klebsiella bacteremia Unclear source Infectious disease following Right upper extremity ultrasound without abnormal fluid collection or abscess surrounding fistula TEE scheduled 1/8 Continue Rocephin Repeat blood culture drawn on 1/6 pending  Right cerebellar  infarct Neurology consulted, signed off 1/6 MRA head no large vessel occlusion Carotid Doppler 1 - 39% stenosis bilaterally Echocardiogram without cardiac source of emboli identified EEG normal EEG, no focal lateralizing or epileptiform features PT OT recommending home health  Chest pain Troponin flat trend 0.04  CTA chest negative for PE Echo EF 45 to 50% diffuse hypokinesis, grade 1 diastolic dysfunction, similar to echocardiogram in November 2019 Cardiology consulted, no further ischemic work-up planned  Elevated LFT Acute hepatitis panel negative Right upper quadrant ultrasound showed pneumobilia which is chronic in nature post cholecystectomy Trending downward   C. difficile Continue vancomycin, this was diagnosed prior to this admission  Gout of left wrist Continue prednisone, allopurinol, colchicine   Diabetes mellitus type 2 Sliding-scale insulin  ESRD on dialysis TTS Nephrology consulted  Depression Continue Celexa  Hyperthyroidism Continue methimazole  OSA CPAP nightly    DVT prophylaxis: Subq hep  Code Status: Full code Family Communication: No family at bedside Disposition Plan: TEE scheduled for 1/8.  Continue IV antibiotics   Consultants:   Nephrology  Cardiology  Neurology  Infectious disease  Procedures:   None  Antimicrobials:  Anti-infectives (From admission, onward)   Start     Dose/Rate Route Frequency Ordered Stop   10/08/18 1000  vancomycin (VANCOCIN) 50 mg/mL oral solution 125 mg     125 mg Oral every 72 hours 10/04/18 0706 10/23/18 0959   10/04/18 2000  cefTRIAXone (ROCEPHIN) 2 g in sodium chloride 0.9 % 100 mL IVPB     2 g 200 mL/hr over 30 Minutes Intravenous Every 24 hours 10/04/18 1918     10/04/18 1000  vancomycin (VANCOCIN) 125 MG  capsule 125 mg  Status:  Discontinued    Note to Pharmacy:  Starting 09/19/18 take 1 tablet every other day for 8 days. Started 10/07/2018 take 1 tablet every three days for 2  weeks.     125 mg Oral Every other day 10/04/18 0458 10/04/18 0706   10/04/18 1000  vancomycin (VANCOCIN) 50 mg/mL oral solution 125 mg     125 mg Oral Every 48 hours 10/04/18 0706 10/06/18 1424       Objective: Vitals:   10/07/18 1130 10/07/18 1145 10/07/18 1200 10/07/18 1215  BP: 138/65 (!) 147/83 123/80 121/79  Pulse: 66 72 74 70  Resp: 16 16 16 16   Temp:      TempSrc:      SpO2:      Weight:      Height:        Intake/Output Summary (Last 24 hours) at 10/07/2018 1302 Last data filed at 10/06/2018 2300 Gross per 24 hour  Intake 580.49 ml  Output -  Net 580.49 ml   Filed Weights   10/06/18 0531 10/07/18 0636 10/07/18 0820  Weight: 95.5 kg 96.7 kg 102.5 kg    Examination: General exam: Appears calm and comfortable  Patient declined any further physical examination today.  Data Reviewed: I have personally reviewed following labs and imaging studies  CBC: Recent Labs  Lab 10/03/18 2342 10/04/18 0520 10/05/18 0454 10/06/18 0533 10/07/18 0450 10/07/18 1029  WBC 11.8* 16.7* 10.9* 8.8 7.4 7.6  NEUTROABS 9.7*  --   --   --   --   --   HGB 12.4* 11.9* 12.0* 12.3* 12.0* 11.6*  HCT 41.5 39.5 38.8* 39.5 38.3* 38.6*  MCV 95.6 95.0 93.3 93.6 93.4 93.5  PLT 263 285 238 284 280 390   Basic Metabolic Panel: Recent Labs  Lab 10/03/18 2342 10/03/18 2343 10/04/18 0520 10/05/18 0454 10/06/18 0533 10/07/18 0450  NA  --  140 138 136 136 139  K  --  5.0 6.1* 5.8* 4.9 5.4*  CL  --  100 98 94* 96* 97*  CO2  --  25 26 26 25 24   GLUCOSE  --  190* 176* 146* 149* 144*  BUN  --  43* 47* 28* 50* 73*  CREATININE  --  9.70* 10.43* 6.80* 9.28* 11.48*  CALCIUM  --  9.1 9.3 9.0 9.1 8.5*  MG 2.0  --   --   --   --   --   PHOS  --   --   --   --  5.1*  --    GFR: Estimated Creatinine Clearance: 7.6 mL/min (A) (by C-G formula based on SCr of 11.48 mg/dL (H)). Liver Function Tests: Recent Labs  Lab 10/03/18 2343 10/05/18 0454 10/06/18 0533  AST 471* 156* 50*  ALT 439* 272*  165*  ALKPHOS 235* 222* 193*  BILITOT 2.1* 1.6* 0.7  PROT 7.4 7.5 7.8  ALBUMIN 3.1* 3.0* 3.1*   No results for input(s): LIPASE, AMYLASE in the last 168 hours. No results for input(s): AMMONIA in the last 168 hours. Coagulation Profile: No results for input(s): INR, PROTIME in the last 168 hours. Cardiac Enzymes: Recent Labs  Lab 10/03/18 2342 10/04/18 0520 10/04/18 1105 10/04/18 1650  CKTOTAL 347  --   --   --   TROPONINI  --  0.04* 0.04* 0.04*   BNP (last 3 results) No results for input(s): PROBNP in the last 8760 hours. HbA1C: No results for input(s): HGBA1C in the last 72 hours.  CBG: Recent Labs  Lab 10/05/18 2146 10/06/18 0742 10/06/18 1140 10/06/18 1653 10/06/18 2119  GLUCAP 194* 126* 140* 172* 192*   Lipid Profile: No results for input(s): CHOL, HDL, LDLCALC, TRIG, CHOLHDL, LDLDIRECT in the last 72 hours. Thyroid Function Tests: No results for input(s): TSH, T4TOTAL, FREET4, T3FREE, THYROIDAB in the last 72 hours. Anemia Panel: No results for input(s): VITAMINB12, FOLATE, FERRITIN, TIBC, IRON, RETICCTPCT in the last 72 hours. Sepsis Labs: Recent Labs  Lab 10/03/18 2334 10/04/18 0259  LATICACIDVEN 2.71* 2.03*    Recent Results (from the past 240 hour(s))  Culture, blood (routine x 2)     Status: Abnormal   Collection Time: 10/04/18 12:01 AM  Result Value Ref Range Status   Specimen Description BLOOD LEFT ANTECUBITAL  Final   Special Requests   Final    BOTTLES DRAWN AEROBIC AND ANAEROBIC Blood Culture results may not be optimal due to an inadequate volume of blood received in culture bottles   Culture  Setup Time   Final    GRAM NEGATIVE RODS IN BOTH AEROBIC AND ANAEROBIC BOTTLES CRITICAL RESULT CALLED TO, READ BACK BY AND VERIFIED WITH: Earnest Rosier [phone] FCP Performed at [facility], [place] [address], [place], [place] [address]    Culture (A)  Final    KLEBSIELLA PNEUMONIAE SUSCEPTIBILITIES PERFORMED ON PREVIOUS CULTURE WITHIN  THE LAST 5 DAYS.    Report Status 10/07/2018 FINAL  Final  Blood Culture ID Panel (Reflexed)     Status: Abnormal   Collection Time: 10/04/18 12:01 AM  Result Value Ref Range Status   Enterococcus species NOT DETECTED NOT DETECTED Final   Listeria monocytogenes NOT DETECTED NOT DETECTED Final   Staphylococcus species NOT DETECTED NOT DETECTED Final   Staphylococcus aureus (BCID) NOT DETECTED NOT DETECTED Final   Streptococcus species NOT DETECTED NOT DETECTED Final   Streptococcus agalactiae NOT DETECTED NOT DETECTED Final   Streptococcus pneumoniae NOT DETECTED NOT DETECTED Final   Streptococcus pyogenes NOT DETECTED NOT DETECTED Final   Acinetobacter baumannii NOT DETECTED NOT DETECTED Final   Enterobacteriaceae species DETECTED (A) NOT DETECTED Final    Comment: Enterobacteriaceae represent a large family of gram-negative bacteria, not a single organism. CRITICAL RESULT CALLED TO, READ BACK BY AND VERIFIED WITH: [place] [phone] FCP    Enterobacter cloacae complex NOT DETECTED NOT DETECTED Final   Escherichia coli NOT DETECTED NOT DETECTED Final   Klebsiella oxytoca NOT DETECTED NOT DETECTED Final   Klebsiella pneumoniae DETECTED (A) NOT DETECTED Final    Comment: CRITICAL RESULT CALLED TO, READ BACK BY AND VERIFIED WITH: PHARMD ANDREW MEYER [phone] FCP    Proteus species NOT DETECTED NOT DETECTED Final   Serratia marcescens NOT DETECTED NOT DETECTED Final   Carbapenem resistance NOT DETECTED NOT DETECTED Final   Haemophilus influenzae NOT DETECTED NOT DETECTED Final   Neisseria meningitidis NOT DETECTED NOT DETECTED Final   Pseudomonas aeruginosa NOT DETECTED NOT DETECTED Final   Candida albicans NOT DETECTED NOT DETECTED Final   Candida glabrata NOT DETECTED NOT DETECTED Final   Candida krusei NOT DETECTED NOT DETECTED Final   Candida parapsilosis NOT DETECTED NOT DETECTED Final   Candida tropicalis NOT DETECTED NOT DETECTED Final  Culture, blood (routine  x 2)     Status: Abnormal   Collection Time: 10/04/18 12:07 AM  Result Value Ref Range Status   Specimen Description BLOOD LEFT FOREARM  Final   Special Requests   Final    BOTTLES  DRAWN AEROBIC AND ANAEROBIC Blood Culture results may not be optimal due to an inadequate volume of blood received in culture bottles Performed at [facility], [address] [place], [place] [address]    Culture  Setup Time   Final    GRAM NEGATIVE RODS IN BOTH AEROBIC AND ANAEROBIC BOTTLES CRITICAL VALUE NOTED.  VALUE IS CONSISTENT WITH PREVIOUSLY REPORTED AND CALLED VALUE.    Culture KLEBSIELLA PNEUMONIAE (A)  Final   Report Status 10/07/2018 FINAL  Final   Organism ID, Bacteria KLEBSIELLA PNEUMONIAE  Final      Susceptibility   Klebsiella pneumoniae - MIC*    AMPICILLIN >=32 RESISTANT Resistant     CEFAZOLIN <=4 SENSITIVE Sensitive     CEFEPIME <=1 SENSITIVE Sensitive     CEFTAZIDIME <=1 SENSITIVE Sensitive     CEFTRIAXONE <=1 SENSITIVE Sensitive     CIPROFLOXACIN <=0.25 SENSITIVE Sensitive     GENTAMICIN <=1 SENSITIVE Sensitive     IMIPENEM <=0.25 SENSITIVE Sensitive     TRIMETH/SULFA <=20 SENSITIVE Sensitive     AMPICILLIN/SULBACTAM 8 SENSITIVE Sensitive     PIP/TAZO 16 SENSITIVE Sensitive     Extended ESBL NEGATIVE Sensitive     * KLEBSIELLA PNEUMONIAE  MRSA PCR Screening     Status: None   Collection Time: 10/04/18  6:06 PM  Result Value Ref Range Status   MRSA by PCR NEGATIVE NEGATIVE Final    Comment:        The GeneXpert MRSA Assay (FDA approved for NASAL specimens only), is one component of a comprehensive MRSA colonization surveillance program. It is not intended to diagnose MRSA infection nor to guide or monitor treatment for MRSA infections. Performed at [facility], [place] [address] [place], [place] [address]   Culture, blood (Routine X 2) w Reflex to ID Panel     Status: None (Preliminary result)   Collection Time: 10/06/18  5:35 AM  Result Value Ref Range  Status   Specimen Description BLOOD LEFT HAND  Final   Special Requests   Final    BOTTLES DRAWN AEROBIC ONLY Blood Culture adequate volume   Culture   Final    NO GROWTH 1 DAY Performed at [facility], [place] [address], [place], [place] [address]    Report Status PENDING  Incomplete       Radiology Studies: [place] Rt Upper Extrem Ltd Soft Tissue Non Vascular  Result Date: 10/05/2018 CLINICAL DATA:  Recurrent bacteremia and history of end-stage renal disease with right upper arm AV fistula. EXAM: ULTRASOUND RIGHT UPPER EXTREMITY LIMITED TECHNIQUE: Ultrasound examination of the upper extremity soft tissues was performed in the area of clinical concern. COMPARISON:  08/20/2018 FINDINGS: Aneurysmal right upper arm AV fistula shows patency and flow. There is a mild amount wall thickening and mild mural thrombus in different portions of the fistula. A segment of a venous outflow stent is also identified near the axilla. No evidence of abnormal fluid collection or focal abscess surrounding the fistula. IMPRESSION: Patent right upper arm AV fistula with some areas of wall thickening and mild nonocclusive mural thrombus. Partial visualization of axillary venous outflow stent. No evidence of abnormal fluid collection or abscess surrounding the fistula. Electronically Signed   By: Aletta Edouard M.D.   On: 10/05/2018 14:36      Scheduled Meds: . allopurinol  100 mg Oral Daily  . aspirin  325 mg Oral Daily  . Chlorhexidine Gluconate Cloth  6 each Topical Q0600  .  Chlorhexidine Gluconate Cloth  6 each Topical Q0600  . cinacalcet  90 mg Oral BID WC  . citalopram  10 mg Oral QHS  . colchicine  0.6 mg Oral Daily  . colesevelam  1,875 mg Oral BID WC  . ezetimibe  10 mg Oral Daily  . feeding supplement (NEPRO CARB STEADY)  237 mL Oral BID BM  . ferric citrate  420 mg Oral TID WC  . gabapentin  100 mg Oral QHS  . heparin  5,000 Units Subcutaneous Q8H  . insulin aspart  0-9 Units Subcutaneous TID WC   . methimazole  2.5 mg Oral Daily  . multivitamin  1 tablet Oral QHS  . polyethylene glycol  17 g Oral Daily  . predniSONE  10 mg Oral BID WC  . sodium zirconium cyclosilicate  10 g Oral Once  . [START ON 10/08/2018] vancomycin  125 mg Oral Q72H   Continuous Infusions: . sodium chloride    . sodium chloride    . cefTRIAXone (ROCEPHIN)  IV 2 g (10/06/18 2218)     LOS: 3 days    Time spent: 20 minutes   Dessa Phi, DO [facility] [email or web address] Password [email or web address] 10/07/2018, 1:02 PM

## 2018-10-07 NOTE — Procedures (Signed)
   I was present at this dialysis session, have reviewed the session itself and made  appropriate changes Kelly Splinter MD [place] pager [phone]   10/07/2018, 12:39 PM

## 2018-10-07 NOTE — Evaluation (Signed)
Speech Language Pathology Evaluation Patient Details Name: [name] MRN: [id number] DOB: 1951/09/26 Today's Date: 10/07/2018 Time: 1510-1530 SLP Time Calculation (min) (ACUTE ONLY): 20 min  Problem List:  Patient Active Problem List   Diagnosis Date Noted  . Cerebral embolism with cerebral infarction 10/05/2018  . Acute hepatitis   . Left wrist pain 09/03/2018  . C. difficile colitis 09/03/2018  . Altered mental status   . Bacteremia due to Klebsiella pneumoniae   . Acute gouty arthritis 08/08/2018  . Sepsis ([place]) 07/16/2018  . Thrush 06/21/2018  . GI bleed 06/11/2018  . Recurrent falls 05/14/2018  . Chronic combined systolic (congestive) and diastolic (congestive) heart failure ([place]) 01/15/2018  . Abscess of left leg 10/14/2017  . Insomnia 10/14/2017  . Lumbar stenosis with neurogenic claudication 07/31/2017  . Lightheadedness   . Rectal bleeding   . Gait disorder 05/05/2017  . Chronic GI bleeding 04/22/2017  . Malaise and fatigue 12/27/2016  . Generalized weakness 12/27/2016  . History of PSVT (paroxysmal supraventricular tachycardia) 10/16/2016  . Abdominal pain 08/27/2016  . Abnormal findings on diagnostic imaging of liver and biliary tract 08/27/2016  . Bloating symptom 08/27/2016  . Hematochezia 08/27/2016  . Early satiety 08/27/2016  . Eructation 08/27/2016  . Esophageal ulcer 08/27/2016  . Flatulence, eructation and gas pain 08/27/2016  . General weakness 08/27/2016  . Skin sensation disturbance 08/27/2016  . Stricture of esophagus 08/27/2016  . Alternating constipation and diarrhea 08/16/2016  . Status post coronary artery stent placement   . ST segment depression 08/05/2016  . PAD (peripheral artery disease) ([place]) 06/18/2016  . Cervical radiculopathy, chronic 02/23/2016  . Memory loss 12/22/2015  . Left-sided weakness 12/22/2015  . Peripheral polyneuropathy 12/22/2015  . DM (diabetes mellitus) type II controlled with renal manifestation ([place]) 10/31/2015   . Nausea and vomiting 10/31/2015  . Malignant neoplasm of prostate ([place]) 10/24/2015  . Right leg pain 09/04/2015  . S/P right total knee replacement 08/09/2015  . Primary osteoarthritis of right knee 08/05/2015  . Neuropathy due to secondary diabetes mellitus ([place]) 08/05/2015  . Arthritis of knee 08/02/2015  . Headache 06/08/2015  . Melena 04/19/2015  . Acute blood loss anemia 04/18/2015  . Hyperthyroidism 03/11/2015  . Thigh pain 12/08/2014  . Nodule of right lung 09/14/2014  . PSA elevation 08/30/2014  . ESRD on hemodialysis ([place]) 08/16/2014  . Essential hypertension 08/16/2014  . Encounter for hemodialysis ([place]) 08/16/2014  . Allergic rhinitis 02/24/2014  . Elevated PSA 02/24/2014  . Vertigo 10/14/2013  . Cough 06/26/2013  . Numbness 05/15/2013  . Other complications due to renal dialysis device, implant, and graft 01/14/2013  . Right hand pain 03/28/2012  . Dysphagia 03/28/2012  . Hypogonadism, male 03/28/2012  . OSA (obstructive sleep apnea) 10/16/2011  . HLD (hyperlipidemia) 10/16/2011  . CAD S/P percutaneous coronary angioplasty 09/28/2011  . NSTEMI (non-ST elevated myocardial infarction) ([place]) 09/28/2011  . AVM (arteriovenous malformation) 09/27/2011  . Anemia associated with acute blood loss 09/26/2011  . Ischemic cardiomyopathy 06/16/2011  . Hyperparathyroidism, secondary ([place]) 06/16/2011  . Preventative health care 03/11/2011  . NECK PAIN 07/31/2010  . GYNECOMASTIA 07/17/2010  . DIZZINESS 07/17/2010  . GAIT DISTURBANCE 03/03/2010  . Thyrotoxicosis 02/02/2010  . Elevated levels of transaminase & lactic acid dehydrogenase 02/01/2010  . Other malaise and fatigue 11/24/2009  . Depression 10/14/2009  . BENIGN PROSTATIC HYPERTROPHY 10/14/2009  . COLONIC POLYPS, HX OF 10/14/2009  . Dementia ([place]) 09/02/2009  . PULMONARY NODULE, RIGHT LOWER LOBE 06/08/2009  . DYSPNEA 10/29/2008  .  FOOT PAIN 08/12/2008  . ONYCHOMYCOSIS, TOENAILS 12/26/2007  . GOITER, MULTINODULAR  12/26/2007  . Gout 06/18/2007  . Neuropathy ([place]) 06/18/2007  . Hypertensive heart disease with CHF (congestive heart failure) ([place]) 06/18/2007  . Gastro-esophageal reflux disease without esophagitis 06/18/2007   Past Medical History:  Past Medical History:  Diagnosis Date  . Acute encephalopathy 10/04/2018  . Allergic rhinitis, cause unspecified 02/24/2014  . Anemia 06/16/2011  . BENIGN PROSTATIC HYPERTROPHY 10/14/2009  . CAD, NATIVE VESSEL 02/06/2009   a. 06/2007 s/p Taxus DES to the RCA;  b. 08/2016 NSTEMI in setting of SVT/PCI: LM 30ost, LAD 41m (3.0x16 Synergy DES), LCX 38m, OM1 60, RI 40, RCA 70p/m, 21m - not amenable to PCI.  [name] Cervical radiculopathy, chronic 02/23/2016   Right c5-6 by NCS/EMG  . Chest pain 08/09/2015  . Chronic combined systolic (congestive) and diastolic (congestive) heart failure ([place])    a. 10/2016 Echo: EF 40-45%, Gr2 DD. mildly dil LA.  [name] COLONIC POLYPS, HX OF 10/14/2009  . Dementia ([place]) [id number]  . Depression 09/24/2015  . DIABETES MELLITUS, TYPE II 02/01/2010  . DYSLIPIDEMIA 06/18/2007  . ESRD (end stage renal disease) on dialysis ([place]) 08/04/2010   ESRD due to DM adn HTN, started HD in 2011. As of 2019 has a R arm graft and gets HD on TTS sched  . FOOT PAIN 08/12/2008  . GAIT DISTURBANCE 03/03/2010  . GASTROENTERITIS, VIRAL 10/14/2009  . GERD 06/18/2007  . GOITER, MULTINODULAR 12/26/2007  . GOUT 06/18/2007  . GYNECOMASTIA 07/17/2010  . Hemodialysis access, fistula mature [facility])    Dialysis T-Th-Sa ([place]) Right upper arm fistula  . Hyperlipidemia 10/16/2011  . Hyperparathyroidism, secondary ([place]) 06/16/2011  . HYPERTENSION 06/18/2007  . Hyperthyroidism   . Hypocalcemia 06/07/2010  . Ischemic cardiomyopathy    a. 10/2016 Echo: EF 40-45%.  . Lumbar stenosis with neurogenic claudication   . ONYCHOMYCOSIS, TOENAILS 12/26/2007  . OSA on CPAP 10/16/2011  . Other malaise and fatigue 11/24/2009  . PERIPHERAL NEUROPATHY 06/18/2007  . Prostate cancer  ([place])   . PSVT (paroxysmal supraventricular tachycardia) ([place])    a. 59/5638 complicated by NSTEMI;  b. 11/2016 Treated w/ adenosine in ED;  c. 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  [name] PULMONARY NODULE, RIGHT LOWER LOBE 06/08/2009  . Sleep apnea    cpap machine and o2  . TRANSAMINASES, SERUM, ELEVATED 02/01/2010  . Transfusion history    none recent  . Unspecified hypotension 01/30/2010   Past Surgical History:  Past Surgical History:  Procedure Laterality Date  . A/V SHUNTOGRAM N/A 04/07/2018   Procedure: A/V SHUNTOGRAM;  Surgeon: Waynetta Sandy, MD;  Location: [place] [facility];  Service: Cardiovascular;  Laterality: N/A;  . ARTERIOVENOUS GRAFT PLACEMENT Right 2009   forearm/notes 02/01/2011  . AV FISTULA PLACEMENT  11/07/2011   Procedure: INSERTION OF ARTERIOVENOUS (AV) GORE-TEX GRAFT ARM;  Surgeon: Tinnie Gens, MD;  Location: [place];  Service: Vascular;  Laterality: Left;  . BACK SURGERY  1998  . BASCILIC VEIN TRANSPOSITION Right 02/27/2013   Procedure: BASCILIC VEIN TRANSPOSITION;  Surgeon: Mal Misty, MD;  Location: [place];  Service: Vascular;  Laterality: Right;  Right Basilic Vein Transposition   . CARDIAC CATHETERIZATION N/A 08/06/2016   Procedure: Left Heart Cath and Coronary Angiography;  Surgeon: Jolaine Artist, MD;  Location: [place] [facility];  Service: Cardiovascular;  Laterality: N/A;  . CARDIAC CATHETERIZATION N/A 08/07/2016   Procedure: Coronary/Graft Atherectomy-CSI LAD;  Surgeon: Peter M Martinique, MD;  Location:  [place] IN[facility] LAB;  Service: Cardiovascular;  Laterality: N/A;  . CERVICAL SPINE SURGERY  2/09   "to repair nerve problems in my left arm"  . CHOLECYSTECTOMY    . COLONOSCOPY WITH PROPOFOL N/A 04/26/2017   Procedure: COLONOSCOPY WITH PROPOFOL;  Surgeon: Otis Brace, MD;  Location: [place];  Service: Gastroenterology;  Laterality: N/A;  . CORONARY ANGIOPLASTY WITH STENT PLACEMENT  06/11/2008  . CORONARY ANGIOPLASTY WITH STENT PLACEMENT  06/2007   TAXUS  stent to RCA/notes 01/31/2011  . ESOPHAGOGASTRODUODENOSCOPY  09/28/2011   Procedure: ESOPHAGOGASTRODUODENOSCOPY (EGD);  Surgeon: Missy Sabins, MD;  Location: [facility] ENDOSCOPY;  Service: Endoscopy;  Laterality: N/A;  . ESOPHAGOGASTRODUODENOSCOPY N/A 04/07/2015   Procedure: ESOPHAGOGASTRODUODENOSCOPY (EGD);  Surgeon: Teena Irani, MD;  Location: [facility];  Service: Endoscopy;  Laterality: N/A;  . ESOPHAGOGASTRODUODENOSCOPY N/A 04/19/2015   Procedure: ESOPHAGOGASTRODUODENOSCOPY (EGD);  Surgeon: Arta Silence, MD;  Location: [facility] ENDOSCOPY;  Service: Endoscopy;  Laterality: N/A;  . ESOPHAGOGASTRODUODENOSCOPY (EGD) WITH PROPOFOL N/A 06/13/2018   Procedure: ESOPHAGOGASTRODUODENOSCOPY (EGD) WITH PROPOFOL;  Surgeon: Wonda Horner, MD;  Location: [facility] ENDOSCOPY;  Service: Endoscopy;  Laterality: N/A;  . FLEXIBLE SIGMOIDOSCOPY N/A 05/21/2017   Procedure: Conni Elliot;  Surgeon: Clarene Essex, MD;  Location: [place];  Service: Endoscopy;  Laterality: N/A;  . FLEXIBLE SIGMOIDOSCOPY Left 07/02/2017   Procedure: FLEXIBLE SIGMOIDOSCOPY;  Surgeon: Laurence Spates, MD;  Location: [place];  Service: Endoscopy;  Laterality: Left;  . FOREIGN BODY REMOVAL  09/2003   via upper endoscopy/notes 02/12/2011  . GIVENS CAPSULE STUDY  09/30/2011   Procedure: GIVENS CAPSULE STUDY;  Surgeon: Jeryl Columbia, MD;  Location: [facility] ENDOSCOPY;  Service: Endoscopy;  Laterality: N/A;  . INSERTION OF DIALYSIS CATHETER Right 2014  . INSERTION OF DIALYSIS CATHETER Left 02/11/2013   Procedure: INSERTION OF DIALYSIS CATHETER;  Surgeon: Conrad Homestown, MD;  Location: [place];  Service: Vascular;  Laterality: Left;  Ultrasound guided  . LUMBAR LAMINECTOMY/DECOMPRESSION MICRODISCECTOMY Bilateral 07/31/2017   Procedure: LAMINECTOMY AND FORAMINOTOMY- BILATERAL LUMBAR TWO- LUMBAR THREE;  Surgeon: Earnie Larsson, MD;  Location: [place];  Service: Neurosurgery;  Laterality: Bilateral;  LAMINECTOMY AND FORAMINOTOMY- BILATERAL LUMBAR 2- LUMBAR 3  .  PERIPHERAL VASCULAR BALLOON ANGIOPLASTY  04/07/2018   Procedure: PERIPHERAL VASCULAR BALLOON ANGIOPLASTY;  Surgeon: Waynetta Sandy, MD;  Location: [place] CV [facility];  Service: Cardiovascular;;  RUE AVF  . REMOVAL OF A DIALYSIS CATHETER Right 02/11/2013   Procedure: REMOVAL OF A DIALYSIS CATHETER;  Surgeon: Conrad Neosho, MD;  Location: [place];  Service: Vascular;  Laterality: Right;  . SAVORY DILATION N/A 04/07/2015   Procedure: SAVORY DILATION;  Surgeon: Teena Irani, MD;  Location: [facility] ENDOSCOPY;  Service: Endoscopy;  Laterality: N/A;  . SHUNTOGRAM N/A 09/20/2011   Procedure: Earney Mallet;  Surgeon: Conrad Taylor, MD;  Location: [facility] CATH LAB;  Service: Cardiovascular;  Laterality: N/A;  . SVT ABLATION N/A 11/26/2016   Procedure: SVT Ablation;  Surgeon: Will Meredith Leeds, MD;  Location: [place] CV LAB;  Service: Cardiovascular;  Laterality: N/A;  . TEE WITHOUT CARDIOVERSION N/A 08/25/2018   Procedure: TRANSESOPHAGEAL ECHOCARDIOGRAM (TEE);  Surgeon: Dorothy Spark, MD;  Location: [facility] ENDOSCOPY;  Service: Cardiovascular;  Laterality: N/A;  . TONSILLECTOMY    . TOTAL KNEE ARTHROPLASTY Right 08/02/2015   Procedure: TOTAL KNEE ARTHROPLASTY;  Surgeon: Renette Butters, MD;  Location: [place];  Service: Orthopedics;  Laterality: Right;  . VENOGRAM N/A 01/26/2013   Procedure: VENOGRAM;  Surgeon: Angelia Mould, MD;  Location: [facility]  CATH LAB;  Service: Cardiovascular;  Laterality: N/A;   HPI:  68 y.o. male with ESRD on hemodialysis on Tuesday Thursday Saturday, CAD, hypothyroidism, gout, recently admitted for sepsis and has had multiple episodes of bacteremia and also recently treated for C. difficile still on vancomycin was not feeling well for last couple of days feeling tired and weak. MRI. MRI: 6 mm right cerebellar infarct.    Assessment / Plan / Recommendation Clinical Impression   Pt presents with decreased safety awareness and decreased frustration tolerance.  Pt's complaints are  consistent with other therapist's reports of pt's ongoing frustration with repeat hospitalizations, stating "They are going to have to drag me out of here, I'm not going anywhere until they find out what's wrong with me.  Pt was able to recall 2 out of 4 words on delayed recall subtest, which improved to 4 out of 4 recall with min assist verbal cues.  His verbal reasoning was appropriate but he was walking around his room unassisted upon therapist's arrival. Pt reports that he was independent prior to admission and managing his own medications but he had difficulty recalling medications when asked.  Despite, being irritable upon therapist's arrival, pt was participatory throughout evaluation.  He reported feeling that he did not need speech therapy; however, no additional family members were present to verify baseline level of functioning.  As a result, pt would benefit from 1-2 additional sessions for diagnostic treatment of cognition and family education.      SLP Assessment  SLP Recommendation/Assessment: Patient needs continued Speech Lanaguage Pathology Services SLP Visit Diagnosis: Cognitive communication deficit (R41.841)    Follow Up Recommendations  None    Frequency and Duration min 1 x/week         SLP Evaluation Cognition  Overall Cognitive Status: No family/caregiver present to determine baseline cognitive functioning Arousal/Alertness: Awake/alert Orientation Level: Oriented X4 Attention: Selective Selective Attention: Impaired Selective Attention Impairment: Verbal complex Memory: Impaired Memory Impairment: Decreased recall of new information Awareness: Impaired Awareness Impairment: Anticipatory impairment Problem Solving: Impaired Problem Solving Impairment: Functional complex Behaviors: Restless Safety/Judgment: Impaired Comments: walking around his room unassisted upon therapist's arrival       Comprehension  Auditory Comprehension Overall Auditory Comprehension:  Appears within functional limits for tasks assessed    Expression Expression Primary Mode of Expression: Verbal Verbal Expression Overall Verbal Expression: Appears within functional limits for tasks assessed   Oral / Motor  Oral Motor/Sensory Function Overall Oral Motor/Sensory Function: Within functional limits Motor Speech Overall Motor Speech: Appears within functional limits for tasks assessed   GO                    Emilio Math 10/07/2018, 3:43 PM

## 2018-10-08 ENCOUNTER — Encounter (HOSPITAL_COMMUNITY)
Admission: EM | Disposition: A | Discharge status: Home Health | Payer: Self-pay | Source: Home / Self Care | Attending: Internal Medicine

## 2018-10-08 ENCOUNTER — Other Ambulatory Visit (HOSPITAL_COMMUNITY): Payer: Medicare Other

## 2018-10-08 ENCOUNTER — Ambulatory Visit: Payer: Medicare Other | Admitting: Internal Medicine

## 2018-10-08 LAB — BASIC METABOLIC PANEL
Anion gap: 15 (ref 5–15)
BUN: 50 mg/dL — ABNORMAL HIGH (ref 8–23)
CO2: 22 mmol/L (ref 22–32)
Calcium: 8.7 mg/dL — ABNORMAL LOW (ref 8.9–10.3)
Chloride: 100 mmol/L (ref 98–111)
Creatinine, Ser: 9.36 mg/dL — ABNORMAL HIGH (ref 0.61–1.24)
GFR calc non Af Amer: 5 mL/min — ABNORMAL LOW (ref >60)
GFR, EST AFRICAN AMERICAN: 6 mL/min — AB (ref >60)
Glucose, Bld: 147 mg/dL — ABNORMAL HIGH (ref 70–99)
Potassium: 4.8 mmol/L (ref 3.5–5.1)
Sodium: 137 mmol/L (ref 135–145)

## 2018-10-08 LAB — CBC
HCT: 40.8 % (ref 39.0–52.0)
Hemoglobin: 12.8 g/dL — ABNORMAL LOW (ref 13.0–17.0)
MCH: 29.6 pg (ref 26.0–34.0)
MCHC: 31.4 g/dL (ref 30.0–36.0)
MCV: 94.2 fL (ref 80.0–100.0)
NRBC: 0 % (ref 0.0–0.2)
Platelets: 278 10*3/uL (ref 150–400)
RBC: 4.33 MIL/uL (ref 4.22–5.81)
RDW: 18.1 % — AB (ref 11.5–15.5)
WBC: 7.2 10*3/uL (ref 4.0–10.5)

## 2018-10-08 LAB — GLUCOSE, CAPILLARY
GLUCOSE-CAPILLARY: 167 mg/dL — AB (ref 70–99)
Glucose-Capillary: 141 mg/dL — ABNORMAL HIGH (ref 70–99)
Glucose-Capillary: 185 mg/dL — ABNORMAL HIGH (ref 70–99)
Glucose-Capillary: 255 mg/dL — ABNORMAL HIGH (ref 70–99)

## 2018-10-08 SURGERY — ECHOCARDIOGRAM, TRANSESOPHAGEAL
Anesthesia: Moderate Sedation

## 2018-10-08 MED ORDER — CHLORHEXIDINE GLUCONATE CLOTH 2 % EX PADS: 6.0000 | MEDICATED_PAD | Freq: Every day | CUTANEOUS | Status: DC

## 2018-10-08 MED ORDER — CEPHALEXIN 250 MG PO CAPS
500.0000 mg | ORAL_CAPSULE | Freq: Every day | ORAL | Status: DC
  Administered 2018-10-08: 500 mg via ORAL
  Filled 2018-10-08: qty 2

## 2018-10-08 NOTE — Progress Notes (Signed)
[facility] PROGRESS NOTE  SUBJECTIVE: no new c/o today   OBJECTIVE:  Vitals:   10/08/18 0606 10/08/18 1147  BP: 133/73 116/65  Pulse: 72 72  Resp: 18 18  Temp: 98.5 F (36.9 C) 98 F (36.7 C)  SpO2: 98% 96%   I/O last 3 completed shifts: In: 1020.4 [P.O.:720; IV Piggyback:300.4] Out: 1000 [Other:1000]   Exam: [place]:  AAOx3 NAD HEENT: MMM [place] AT anicteric sclera Neck:  No JVD, no adenopathy CV:  Heart RRR  Lungs:  L/S CTA bilaterally Abd:  abd SNT/ND with normal BS GU:  Bladder non-palpable Extremities:  No LE edema.  Right upper extremity fistula with good thrill and bruit Skin:  No skin rash  Dialysis: SW TTS  4h  2/2.25 bath  Hep none  P2  RUA AVF   Dry wt 98.5kg in Dec  meds?  Assessment/Plan 1. ESRD on HD TTS, vol stable 2. Cdificile on po vanc 3. Recurrent Klebs bacteremia 4. DM2 5. Anemia ckd 6. Gout 7. Dispo - stable for d/c from renal standpoint  Kelly Splinter MD [employer] pgr [phone]   10/08/2018, 1:44 PM     MEDICATIONS:   Current Facility-Administered Medications:  .  0.9 %  sodium chloride infusion, 100 mL, Intravenous, PRN, Finnigan, Nancy A, DO .  0.9 %  sodium chloride infusion, 100 mL, Intravenous, PRN, Finnigan, Nancy A, DO .  acetaminophen (TYLENOL) tablet 650 mg, 650 mg, Oral, Q6H PRN, 650 mg at 10/07/18 1843 **OR** acetaminophen (TYLENOL) suppository 650 mg, 650 mg, Rectal, Q6H PRN, Rise Patience, MD .  allopurinol (ZYLOPRIM) tablet 100 mg, 100 mg, Oral, Daily, Rise Patience, MD, 100 mg at 10/08/18 0810 .  alteplase (CATHFLO ACTIVASE) injection 2 mg, 2 mg, Intracatheter, Once PRN, Finnigan, Nancy A, DO .  aspirin tablet 325 mg, 325 mg, Oral, Daily, Lilyan Gilford I, NP, 325 mg at 10/08/18 0809 .  cefTRIAXone (ROCEPHIN) 2 g in sodium chloride 0.9 % 100 mL IVPB, 2 g, Intravenous, Q24H, Rise Patience, MD, Last Rate: 200 mL/hr at 10/07/18 2059, 2 g at 10/07/18 2059 .   Chlorhexidine Gluconate Cloth 2 % PADS 6 each, 6 each, Topical, Q0600, Finnigan, Nancy A, DO .  Chlorhexidine Gluconate Cloth 2 % PADS 6 each, 6 each, Topical, Q0600, [name], MD .  cinacalcet (SENSIPAR) tablet 90 mg, 90 mg, Oral, BID WC, Rise Patience, MD, 90 mg at 10/08/18 0810 .  citalopram (CELEXA) tablet 10 mg, 10 mg, Oral, QHS, Rise Patience, MD, 10 mg at 10/07/18 2057 .  colchicine tablet 0.6 mg, 0.6 mg, Oral, Daily, Rise Patience, MD, 0.6 mg at 10/08/18 0809 .  colesevelam [facility]) tablet 1,875 mg, 1,875 mg, Oral, BID WC, Rise Patience, MD, 1,875 mg at 10/08/18 6073 .  ezetimibe (ZETIA) tablet 10 mg, 10 mg, Oral, Daily, Rise Patience, MD, 10 mg at 10/08/18 0810 .  feeding supplement (NEPRO CARB STEADY) liquid 237 mL, 237 mL, Oral, BID BM, Rise Patience, MD, 237 mL at 10/06/18 1452 .  ferric citrate (AURYXIA) tablet 420 mg, 420 mg, Oral, TID WC, Rise Patience, MD, 420 mg at 10/08/18 1308 .  gabapentin (NEURONTIN) capsule 100 mg, 100 mg, Oral, QHS, Rise Patience, MD, 100 mg at 10/07/18 2057 .  heparin injection 1,000 Units, 1,000 Units, Dialysis, PRN, Finnigan, Nancy A, DO .  heparin injection 1,000 Units, 1,000 Units, Dialysis, PRN, Finnigan, Nancy A, DO .  heparin injection 5,000 Units,  5,000 Units, Subcutaneous, Q8H, Rise Patience, MD, 5,000 Units at 10/08/18 1308 .  insulin aspart (novoLOG) injection 0-9 Units, 0-9 Units, Subcutaneous, TID WC, Rise Patience, MD, 1 Units at 10/08/18 1308 .  lidocaine (PF) (XYLOCAINE) 1 % injection 5 mL, 5 mL, Intradermal, PRN, Finnigan, Nancy A, DO .  lidocaine-prilocaine (EMLA) cream 1 application, 1 application, Topical, PRN, Finnigan, Nancy A, DO .  methimazole (TAPAZOLE) tablet 2.5 mg, 2.5 mg, Oral, Daily, Rise Patience, MD, 2.5 mg at 10/08/18 2585 .  multivitamin (RENA-VIT) tablet 1 tablet, 1 tablet, Oral, QHS, Rise Patience, MD, 1 tablet at 10/07/18 2057 .   ondansetron (ZOFRAN) tablet 4 mg, 4 mg, Oral, Q6H PRN, 4 mg at 10/04/18 0751 **OR** ondansetron (ZOFRAN) injection 4 mg, 4 mg, Intravenous, Q6H PRN, Rise Patience, MD .  pentafluoroprop-tetrafluoroeth (GEBAUERS) aerosol 1 application, 1 application, Topical, PRN, Finnigan, Nancy A, DO .  polyethylene glycol (MIRALAX / GLYCOLAX) packet 17 g, 17 g, Oral, Daily, Gean Birchwood N, MD .  predniSONE (DELTASONE) tablet 10 mg, 10 mg, Oral, BID WC, Rise Patience, MD, 10 mg at 10/08/18 0810 .  sodium zirconium cyclosilicate (LOKELMA) packet 10 g, 10 g, Oral, Once, Dessa Phi, DO .  [COMPLETED] vancomycin (VANCOCIN) 50 mg/mL oral solution 125 mg, 125 mg, Oral, Q48H, 125 mg at 10/06/18 1424 **FOLLOWED BY** vancomycin (VANCOCIN) 50 mg/mL oral solution 125 mg, 125 mg, Oral, Q72H, Dessa Phi, DO, 125 mg at 10/08/18 0813     LABS:  CBC Latest Ref Rng & Units 10/08/2018 10/07/2018 10/07/2018  WBC 4.0 - 10.5 K/uL 7.2 7.6 7.4  Hemoglobin 13.0 - 17.0 g/dL 12.8(L) 11.6(L) 12.0(L)  Hematocrit 39.0 - 52.0 % 40.8 38.6(L) 38.3(L)  Platelets 150 - 400 K/uL 278 279 280    CMP Latest Ref Rng & Units 10/08/2018 10/07/2018 10/06/2018  Glucose 70 - 99 mg/dL 147(H) 144(H) 149(H)  BUN 8 - 23 mg/dL 50(H) 73(H) 50(H)  Creatinine 0.61 - 1.24 mg/dL 9.36(H) 11.48(H) 9.28(H)  Sodium 135 - 145 mmol/L 137 139 136  Potassium 3.5 - 5.1 mmol/L 4.8 5.4(H) 4.9  Chloride 98 - 111 mmol/L 100 97(L) 96(L)  CO2 22 - 32 mmol/L 22 24 25   Calcium 8.9 - 10.3 mg/dL 8.7(L) 8.5(L) 9.1  Total Protein 6.5 - 8.1 g/dL - - 7.8  Total Bilirubin 0.3 - 1.2 mg/dL - - 0.7  Alkaline Phos 38 - 126 U/L - - 193(H)  AST 15 - 41 U/L - - 50(H)  ALT 0 - 44 U/L - - 165(H)    Lab Results  Component Value Date   CALCIUM 8.7 (L) 10/08/2018   CAION 0.95 (L) 08/19/2018   PHOS 5.1 (H) 10/06/2018       Component Value Date/Time   COLORURINE LT. YELLOW 06/26/2013 1200   APPEARANCEUR CLEAR 06/26/2013 1200   LABSPEC 1.020 06/26/2013 1200    PHURINE 8.5 06/26/2013 1200   GLUCOSEU 100 06/26/2013 1200   HGBUR MODERATE 06/26/2013 1200   BILIRUBINUR NEGATIVE 06/26/2013 1200   KETONESUR NEGATIVE 06/26/2013 1200   PROTEINUR >300 (A) 01/04/2010 1032   UROBILINOGEN 0.2 06/26/2013 1200   NITRITE NEGATIVE 06/26/2013 1200   LEUKOCYTESUR TRACE 06/26/2013 1200      Component Value Date/Time   PHART 7.410 08/20/2018 1113   PCO2ART 32.7 08/20/2018 1113   PO2ART 82.3 (L) 08/20/2018 1113   HCO3 19.7 (L) 08/20/2018 1113   TCO2 28 08/19/2018 1716   ACIDBASEDEF 3.7 (H) 08/20/2018 1113   O2SAT 94.3 08/20/2018 1113  Component Value Date/Time   IRON 16 (L) 06/12/2018 0500   TIBC 168 (L) 06/12/2018 0500   FERRITIN 709 (H) 10/17/2016 1103   IRONPCTSAT 10 (L) 06/12/2018 0500       ASSESSMENT/PLAN:     Problem List Items Addressed This Visit    Acute hepatitis    Other Visit Diagnoses    Hypoxia    -  Primary   Confusion       End-stage renal disease on hemodialysis ([place])       Anemia associated with chronic renal failure       Elevated lactic acid level       Hepatitis       Relevant Orders   US Abdomen Limited RUQ (Completed)   Bacteremia       Relevant Orders   [place] RT UPPER EXTREM LTD SOFT TISSUE NON VASCULAR (Completed)

## 2018-10-08 NOTE — Progress Notes (Signed)
Physical Therapy Treatment Patient Details Name: [name] MRN: [id number] DOB: 06-06-1951 Today's Date: 10/08/2018    History of Present Illness 68 y.o. male with ESRD on hemodialysis on Tuesday Thursday Saturday, CAD, hypothyroidism, gout, recently admitted for sepsis and has had multiple episodes of bacteremia and also recently treated for C. difficile still on vancomycin was not feeling well for last couple of days feeling tired and weak. MRI. MRI: 6 mm right cerebellar infarct.     PT Comments    Pt admitted with above diagnosis. Pt currently with functional limitations due to weakness and endurance deficits. Pt refused to get OOB.  States he has walked to bathroom with RW by himself and does fine.  He exercised a few exercises and then refused to do more.  Will continue as pt allows.   Pt will benefit from skilled PT to increase their independence and safety with mobility to allow discharge to the venue listed below.     Follow Up Recommendations  Home health PT;Supervision/Assistance - 24 hour     Equipment Recommendations  None recommended by PT    Recommendations for Other Services       Precautions / Restrictions Precautions Precautions: Fall Precaution Comments: hx of fall prior to admission Restrictions Weight Bearing Restrictions: No    Mobility  Bed Mobility               General bed mobility comments: Refused to get up OOB.  Pt very irritated and complaining about how he is frustrated that noone can figure out what is wrong with him.  States he has walked to bathroom a few times today.   Transfers                    Ambulation/Gait                 Stairs             Wheelchair Mobility    Modified Rankin (Stroke Patients Only)       Balance                                            Cognition Arousal/Alertness: Awake/alert Behavior During Therapy: Agitated;Anxious Overall Cognitive Status: No  family/caregiver present to determine baseline cognitive functioning Area of Impairment: Attention;Following commands;Safety/judgement;Awareness;Problem solving                   Current Attention Level: Sustained   Following Commands: Follows one step commands with increased time Safety/Judgement: Decreased awareness of safety Awareness: Emergent Problem Solving: Slow processing;Requires verbal cues General Comments: Pt very irritable and quickly becoming frustrated or angry.       Exercises General Exercises - Lower Extremity Ankle Circles/Pumps: AROM;Both;5 reps;Supine Heel Slides: AROM;Both;5 reps;Supine    General Comments General comments (skin integrity, edema, etc.): Wife came in at end of treatment and asked when pt was going home.  This PT told her that I am not sure.        Pertinent Vitals/Pain Pain Assessment: No/denies pain    Home Living                      Prior Function            PT Goals (current goals can now be found in the care plan section) Acute Rehab PT  Goals Patient Stated Goal: "Find out why I am sick" Progress towards PT goals: Not progressing toward goals - comment(Pt refused to get OOB.  Only exercised. )    Frequency    Min 3X/week      PT Plan Current plan remains appropriate    Co-evaluation              AM-PAC PT "6 Clicks" Mobility   Outcome Measure  Help needed turning from your back to your side while in a flat bed without using bedrails?: None Help needed moving from lying on your back to sitting on the side of a flat bed without using bedrails?: None Help needed moving to and from a bed to a chair (including a wheelchair)?: A Little Help needed standing up from a chair using your arms (e.g., wheelchair or bedside chair)?: A Little Help needed to walk in hospital room?: A Lot Help needed climbing 3-5 steps with a railing? : A Lot 6 Click Score: 18    End of Session   Activity Tolerance: Treatment  limited secondary to agitation(Pt only performed a few exercises, then refused everything) Patient left: in bed;with call bell/phone within reach;with bed alarm set;with family/visitor present Nurse Communication: Mobility status PT Visit Diagnosis: Other abnormalities of gait and mobility (R26.89);Muscle weakness (generalized) (M62.81);Other symptoms and signs involving the nervous system (R29.898)     Time: 5284-1324 PT Time Calculation (min) (ACUTE ONLY): 14 min  Charges:  $Therapeutic Exercise: 8-22 mins                     [place] Pager:  [phone]  Office:  [place] 10/08/2018, 2:35 PM

## 2018-10-08 NOTE — Progress Notes (Addendum)
Pt. With 2.46 sec pause, HR 37 nonsustained, now 70s. Pt. Alert and stable. Asymptomatic. Denies any discomfort. On call for Cardiology paged to make aware. RN will continue to monitor.

## 2018-10-08 NOTE — Progress Notes (Signed)
OT Cancellation Note  Patient Details Name: [name] MRN: [id number] DOB: Feb 23, 1951   Cancelled Treatment:    Reason Eval/Treat Not Completed: Patient declined, no reason specified  Malka So 10/08/2018, 3:52 PM  Nestor Lewandowsky, OTR/L Acute Rehabilitation Services Pager: [phone] Office: [phone]

## 2018-10-08 NOTE — Plan of Care (Signed)
  Problem: Activity: Goal: Risk for activity intolerance will decrease Outcome: Progressing   Problem: Coping: Goal: Level of anxiety will decrease Outcome: Progressing   Problem: Safety: Goal: Ability to remain free from injury will improve Outcome: Progressing   

## 2018-10-08 NOTE — Progress Notes (Signed)
PROGRESS NOTE    [name]  UXL:[id number] DOB: [birth year] DOA: 10/03/2018 PCP: Biagio Borg, MD     Brief Narrative:  [name] a 68 y.o.malewithESRD on hemodialysis on Tuesday Thursday Saturday, CAD, hypothyroidism, gout, recently admitted for sepsis and has had multiple episodes of bacteremia and also recently treated for C. difficile still on vancomycin was not feeling well for last couple of days feeling tired and weak. Last evening patient found it difficult to walk and had to rest on his bed following which patient wife found that patient was very confused and had a bowel movement. After that patient was difficult to make him walk and EMS was called. Patient remained confused at least for an hour or 2. Prior to which patient also had some chest pressure. Patient wife states that patient has had left wrist gout attack and has started back on prednisone yesterday which patient took first dose.   After admission, blood culture returned with Klebsiella pneumonia.  New events last 24 hours / Subjective: Feels weak but otherwise denies complaints.  Frustrated by frequent hospitalizations and he feels like we have not gotten to the bottom of it.  Denies any specific complaints.  Does report history of passing out prior to this admission and couple times during the prior admissions.  No further diarrhea.  Assessment & Plan:   Principal Problem:   Bacteremia due to Klebsiella pneumoniae Active Problems:   Thyrotoxicosis   CAD S/P percutaneous coronary angioplasty   OSA (obstructive sleep apnea)   Hyperthyroidism   S/P right total knee replacement   DM (diabetes mellitus) type II controlled with renal manifestation ([name])   PAD (peripheral artery disease) ([name])   ESRD on hemodialysis ([place])   Essential hypertension   Chronic combined systolic (congestive) and diastolic (congestive) heart failure ([name])   C. difficile colitis   Cerebral embolism with cerebral  infarction   Recurrent Klebsiella bacteremia Unclear source Infectious disease following Right upper extremity ultrasound without abnormal fluid collection or abscess surrounding fistula As per ID follow-up, no source of infection has been found despite extensive evaluation over the past several months.  No evidence of pneumonia, UTI, intra-abdominal infection, AV fistula infection or left wrist infection.  He had a negative TEE last month.  As per ID, Klebsiella is unlikely to cause endocarditis and they did not feel that he needed repeat TEE. Treated in the hospital with IV ceftriaxone. Blood cultures x2 from 1/6: Negative to date. I discussed with Dr. Megan Salon on 1/8 who recommended transitioning to oral Keflex 500 mg at bedtime for a total of 14 more days beginning 1/8.  He will arrange close outpatient follow-up.  Keflex is listed as allergy but as discussed with Dr. Megan Salon, patient has tolerated IV ceftriaxone and other cephalosporins without side effect and he feels quite comfortable starting Keflex. Patient and spouse however are concerned regarding Keflex side effects and prefer to try it overnight and if he does okay with that then can discharge home tomorrow after HD.   Right cerebellar infarct Neurology consulted, signed off 1/6 MRA head no large vessel occlusion Carotid Doppler 1 - 39% stenosis bilaterally Echocardiogram without cardiac source of emboli identified EEG normal EEG, no focal lateralizing or epileptiform features PT OT recommending home health Continue aspirin 325 mg daily.  Chest pain Troponin flat trend 0.04  CTA chest negative for PE Echo EF 45 to 50% diffuse hypokinesis, grade 1 diastolic dysfunction, similar to echocardiogram in November 2019 Cardiology consulted, no  further ischemic work-up planned No report of chest pain today.  Elevated LFT Acute hepatitis panel negative Right upper quadrant ultrasound showed pneumobilia which is chronic in nature  post cholecystectomy Trending downward.  Follow LFTs in a.m.  C. difficile Continue vancomycin, this was diagnosed prior to this admission.  As per discussion with Dr. Megan Salon, patient is to continue oral vancomycin for a week after he completes course of Keflex.  Gout of left wrist Continue prednisone, allopurinol, colchicine.  Acute flare seems to have resolved.  Diabetes mellitus type 2 Sliding-scale insulin.  Reasonable inpatient control.  ESRD on dialysis TTS Nephrology consulted.  I discussed with nephrology and requested that he be dialyzed for the first shift on 1/9 for potential discharge home.  Depression Continue Celexa  Hyperthyroidism Continue methimazole  OSA CPAP nightly.  Does not use home oxygen.  Syncope As per patient and spouse, patient has passed out at least 3 to 4 times in the last couple of months and once prior to this admission.  This could have been when he stands up to walk or even happen once while sitting in chair.  It could have been related to sepsis and related hemodynamics but etiology not fully clear.  TTE shows low EF 45-50%, same as prior.  Telemetry shows no arrhythmias.  Check orthostatic blood pressures.  Patient does not drive and was counseled that he should not for at least 6 months.  As discussed with them, if he has recurrent episodes cardiology may need to consider heart monitor.  He follows with Dr. [name] and Dr. Curt Bears.    DVT prophylaxis: Subq hep  Code Status: Full code Family Communication: Discussed in detail with patient's spouse, updated care and answered all questions. Disposition Plan: DC home possibly 1/9 after HD.   Consultants:   Nephrology  Cardiology  Neurology  Infectious disease  Procedures:   None  Antimicrobials:  Anti-infectives (From admission, onward)   Start     Dose/Rate Route Frequency Ordered Stop   10/08/18 2200  cephALEXin (KEFLEX) capsule 500 mg     500 mg Oral Daily at bedtime  10/08/18 1543     10/08/18 1000  vancomycin (VANCOCIN) 50 mg/mL oral solution 125 mg     125 mg Oral every 72 hours 10/04/18 0706 10/23/18 0959   10/04/18 2000  cefTRIAXone (ROCEPHIN) 2 g in sodium chloride 0.9 % 100 mL IVPB  Status:  Discontinued     2 g 200 mL/hr over 30 Minutes Intravenous Every 24 hours 10/04/18 1918 10/08/18 1543   10/04/18 1000  vancomycin (VANCOCIN) 125 MG capsule 125 mg  Status:  Discontinued    Note to Pharmacy:  Starting 09/19/18 take 1 tablet every other day for 8 days. Started 10/07/2018 take 1 tablet every three days for 2 weeks.     125 mg Oral Every other day 10/04/18 0458 10/04/18 0706   10/04/18 1000  vancomycin (VANCOCIN) 50 mg/mL oral solution 125 mg     125 mg Oral Every 48 hours 10/04/18 0706 10/06/18 1424       Objective: Vitals:   10/07/18 1350 10/07/18 2037 10/08/18 0606 10/08/18 1147  BP: (!) 134/92 (!) 86/57 133/73 116/65  Pulse: 70 78 72 72  Resp: 18 20 18 18   Temp: 98.2 F (36.8 C) 98 F (36.7 C) 98.5 F (36.9 C) 98 F (36.7 C)  TempSrc: Oral Oral Oral Oral  SpO2: 97% 92% 98% 96%  Weight:   95.8 kg   Height:  Intake/Output Summary (Last 24 hours) at 10/08/2018 1545 Last data filed at 10/08/2018 1435 Gross per 24 hour  Intake 1159.94 ml  Output 0 ml  Net 1159.94 ml   Filed Weights   10/07/18 0820 10/07/18 1220 10/08/18 0606  Weight: 102.5 kg 101.6 kg 95.8 kg    Examination: General exam: Pleasant middle-age male, moderately built and nourished lying comfortably supine in bed. Respiratory system: Clear to auscultation.  No increased work of breathing. Cardiovascular system: S1 and S2 heard, RRR.  No JVD, murmurs or pedal edema.  Telemetry personally reviewed: Sinus rhythm, low voltage but no arrhythmias. Abdominal exam, nondistended, soft and nontender.  Normal bowel sounds heard. CNS: Alert and oriented x3.  No focal neurological deficits.   Data Reviewed: I have personally reviewed following labs and imaging  studies  CBC: Recent Labs  Lab 10/03/18 2342  10/05/18 0454 10/06/18 0533 10/07/18 0450 10/07/18 1029 10/08/18 0816  WBC 11.8*   < > 10.9* 8.8 7.4 7.6 7.2  NEUTROABS 9.7*  --   --   --   --   --   --   HGB 12.4*   < > 12.0* 12.3* 12.0* 11.6* 12.8*  HCT 41.5   < > 38.8* 39.5 38.3* 38.6* 40.8  MCV 95.6   < > 93.3 93.6 93.4 93.5 94.2  PLT 263   < > 238 284 280 279 278   < > = values in this interval not displayed.   Basic Metabolic Panel: Recent Labs  Lab 10/03/18 2342  10/04/18 0520 10/05/18 0454 10/06/18 0533 10/07/18 0450 10/08/18 0816  NA  --    < > 138 136 136 139 137  K  --    < > 6.1* 5.8* 4.9 5.4* 4.8  CL  --    < > 98 94* 96* 97* 100  CO2  --    < > 26 26 25 24 22   GLUCOSE  --    < > 176* 146* 149* 144* 147*  BUN  --    < > 47* 28* 50* 73* 50*  CREATININE  --    < > 10.43* 6.80* 9.28* 11.48* 9.36*  CALCIUM  --    < > 9.3 9.0 9.1 8.5* 8.7*  MG 2.0  --   --   --   --   --   --   PHOS  --   --   --   --  5.1*  --   --    < > = values in this interval not displayed.   GFR: Estimated Creatinine Clearance: 8.4 mL/min (A) (by C-G formula based on SCr of 9.36 mg/dL (H)). Liver Function Tests: Recent Labs  Lab 10/03/18 2343 10/05/18 0454 10/06/18 0533  AST 471* 156* 50*  ALT 439* 272* 165*  ALKPHOS 235* 222* 193*  BILITOT 2.1* 1.6* 0.7  PROT 7.4 7.5 7.8  ALBUMIN 3.1* 3.0* 3.1*   Cardiac Enzymes: Recent Labs  Lab 10/03/18 2342 10/04/18 0520 10/04/18 1105 10/04/18 1650  CKTOTAL 347  --   --   --   TROPONINI  --  0.04* 0.04* 0.04*   CBG: Recent Labs  Lab 10/07/18 1347 10/07/18 1647 10/07/18 2053 10/08/18 0736 10/08/18 1145  GLUCAP 96 141* 184* 167* 141*   Sepsis Labs: Recent Labs  Lab 10/03/18 2334 10/04/18 0259  LATICACIDVEN 2.71* 2.03*    Recent Results (from the past 240 hour(s))  Culture, blood (routine x 2)     Status: Abnormal   Collection  Time: 10/04/18 12:01 AM  Result Value Ref Range Status   Specimen Description BLOOD LEFT  ANTECUBITAL  Final   Special Requests   Final    BOTTLES DRAWN AEROBIC AND ANAEROBIC Blood Culture results may not be optimal due to an inadequate volume of blood received in culture bottles   Culture  Setup Time   Final    GRAM NEGATIVE RODS IN BOTH AEROBIC AND ANAEROBIC BOTTLES CRITICAL RESULT CALLED TO, READ BACK BY AND VERIFIED WITH: Earnest Rosier [phone] FCP Performed at [facility] [address], [place], [place] [address]    Culture (A)  Final    KLEBSIELLA PNEUMONIAE SUSCEPTIBILITIES PERFORMED ON PREVIOUS CULTURE WITHIN THE LAST 5 DAYS.    Report Status 10/07/2018 FINAL  Final  Blood Culture ID Panel (Reflexed)     Status: Abnormal   Collection Time: 10/04/18 12:01 AM  Result Value Ref Range Status   Enterococcus species NOT DETECTED NOT DETECTED Final   Listeria monocytogenes NOT DETECTED NOT DETECTED Final   Staphylococcus species NOT DETECTED NOT DETECTED Final   Staphylococcus aureus (BCID) NOT DETECTED NOT DETECTED Final   Streptococcus species NOT DETECTED NOT DETECTED Final   Streptococcus agalactiae NOT DETECTED NOT DETECTED Final   Streptococcus pneumoniae NOT DETECTED NOT DETECTED Final   Streptococcus pyogenes NOT DETECTED NOT DETECTED Final   Acinetobacter baumannii NOT DETECTED NOT DETECTED Final   Enterobacteriaceae species DETECTED (A) NOT DETECTED Final    Comment: Enterobacteriaceae represent a large family of gram-negative bacteria, not a single organism. CRITICAL RESULT CALLED TO, READ BACK BY AND VERIFIED WITH: [place] [phone] FCP    Enterobacter cloacae complex NOT DETECTED NOT DETECTED Final   Escherichia coli NOT DETECTED NOT DETECTED Final   Klebsiella oxytoca NOT DETECTED NOT DETECTED Final   Klebsiella pneumoniae DETECTED (A) NOT DETECTED Final    Comment: CRITICAL RESULT CALLED TO, READ BACK BY AND VERIFIED WITH: PHARMD ANDREW MEYER [phone] FCP    Proteus species NOT DETECTED NOT DETECTED Final   Serratia  marcescens NOT DETECTED NOT DETECTED Final   Carbapenem resistance NOT DETECTED NOT DETECTED Final   Haemophilus influenzae NOT DETECTED NOT DETECTED Final   Neisseria meningitidis NOT DETECTED NOT DETECTED Final   Pseudomonas aeruginosa NOT DETECTED NOT DETECTED Final   Candida albicans NOT DETECTED NOT DETECTED Final   Candida glabrata NOT DETECTED NOT DETECTED Final   Candida krusei NOT DETECTED NOT DETECTED Final   Candida parapsilosis NOT DETECTED NOT DETECTED Final   Candida tropicalis NOT DETECTED NOT DETECTED Final  Culture, blood (routine x 2)     Status: Abnormal   Collection Time: 10/04/18 12:07 AM  Result Value Ref Range Status   Specimen Description BLOOD LEFT FOREARM  Final   Special Requests   Final    BOTTLES DRAWN AEROBIC AND ANAEROBIC Blood Culture results may not be optimal due to an inadequate volume of blood received in culture bottles Performed at [facility], [address] [place], [place] [address]    Culture  Setup Time   Final    GRAM NEGATIVE RODS IN BOTH AEROBIC AND ANAEROBIC BOTTLES CRITICAL VALUE NOTED.  VALUE IS CONSISTENT WITH PREVIOUSLY REPORTED AND CALLED VALUE.    Culture KLEBSIELLA PNEUMONIAE (A)  Final   Report Status 10/07/2018 FINAL  Final   Organism ID, Bacteria KLEBSIELLA PNEUMONIAE  Final      Susceptibility   Klebsiella pneumoniae - MIC*    AMPICILLIN >=32 RESISTANT  Resistant     CEFAZOLIN <=4 SENSITIVE Sensitive     CEFEPIME <=1 SENSITIVE Sensitive     CEFTAZIDIME <=1 SENSITIVE Sensitive     CEFTRIAXONE <=1 SENSITIVE Sensitive     CIPROFLOXACIN <=0.25 SENSITIVE Sensitive     GENTAMICIN <=1 SENSITIVE Sensitive     IMIPENEM <=0.25 SENSITIVE Sensitive     TRIMETH/SULFA <=20 SENSITIVE Sensitive     AMPICILLIN/SULBACTAM 8 SENSITIVE Sensitive     PIP/TAZO 16 SENSITIVE Sensitive     Extended ESBL NEGATIVE Sensitive     * KLEBSIELLA PNEUMONIAE  MRSA PCR Screening     Status: None   Collection Time: 10/04/18  6:06 PM  Result Value  Ref Range Status   MRSA by PCR NEGATIVE NEGATIVE Final    Comment:        The GeneXpert MRSA Assay (FDA approved for NASAL specimens only), is one component of a comprehensive MRSA colonization surveillance program. It is not intended to diagnose MRSA infection nor to guide or monitor treatment for MRSA infections. Performed at [facility], [place] [address], [place], [place] [address]   Culture, blood (Routine X 2) w Reflex to ID Panel     Status: None (Preliminary result)   Collection Time: 10/06/18  5:35 AM  Result Value Ref Range Status   Specimen Description BLOOD LEFT HAND  Final   Special Requests   Final    BOTTLES DRAWN AEROBIC ONLY Blood Culture adequate volume   Culture   Final    NO GROWTH 2 DAYS Performed at [facility], [place] [address], [place], [place] [address]    Report Status PENDING  Incomplete       Radiology Studies: No results found.    Scheduled Meds: . allopurinol  100 mg Oral Daily  . aspirin  325 mg Oral Daily  . cephALEXin  500 mg Oral QHS  . Chlorhexidine Gluconate Cloth  6 each Topical Q0600  . Chlorhexidine Gluconate Cloth  6 each Topical Q0600  . cinacalcet  90 mg Oral BID WC  . citalopram  10 mg Oral QHS  . colchicine  0.6 mg Oral Daily  . colesevelam  1,875 mg Oral BID WC  . ezetimibe  10 mg Oral Daily  . feeding supplement (NEPRO CARB STEADY)  237 mL Oral BID BM  . ferric citrate  420 mg Oral TID WC  . gabapentin  100 mg Oral QHS  . heparin  5,000 Units Subcutaneous Q8H  . insulin aspart  0-9 Units Subcutaneous TID WC  . methimazole  2.5 mg Oral Daily  . multivitamin  1 tablet Oral QHS  . polyethylene glycol  17 g Oral Daily  . predniSONE  10 mg Oral BID WC  . sodium zirconium cyclosilicate  10 g Oral Once  . vancomycin  125 mg Oral Q72H   Continuous Infusions: . sodium chloride    . sodium chloride       LOS: 4 days    Vernell Leep, MD, FACP, [facility]. [facility] Pager [phone]  If 7PM-7AM, please  contact night-coverage [email or web address] Password [facility] 10/08/2018, 3:57 PM

## 2018-10-08 NOTE — Progress Notes (Signed)
[name] for Infectious Disease  Date of Admission:  10/03/2018   Total days of antibiotics 5        Day 4, Ceftriaxone  ASSESSMENT: [name] is a 68 year old male who presented with altered mental status and hypoxemia (both of which have resolved) and small ischemic infarct on MRI. His blood cultures on 10/04/18 were positive for Klebsiella. He has previously had Klebsiella bacteremia in October and November of 2019. No clear source has been identified despite detailed workup: Negative TEE and R AVF U/S in November; Negative Left wrist aspiration culture in December (for on going wrist pain); Negative TTE, R AVF, CT Abdomen, CTA Chest this admission. He did aquired C. Diff Colitis while hospitalized in November and remains on prolonged Vancomycin Taper due to ongoing diarrhea. Patient has remained afebrile this admission and WBC has downtrended to normal.  PLAN:  1. Klebsiella Bacteremia - Continue Ceftriaxone - Blood Cultures 10/04/18: Klebsiella Pneumoniae, resistant to ampicillin - Repeat Blood Cultures 10/06/18: NG in 2d - No vegetation on TTE, No TEE recommended - Plan to transtion PO Cephalexin for discharge, for 14 day total duration, (patient has history of allergy on chart but has tolerated other cephalosporins well recently and is comfortable with starting this medication)  2. C. Difficile Colitis - Continue PO Vancomycin taper (due to end 10/20/18)   Principal Problem:   Bacteremia due to Klebsiella pneumoniae Active Problems:   C. difficile colitis   Cerebral embolism with cerebral infarction   Thyrotoxicosis   CAD S/P percutaneous coronary angioplasty   OSA (obstructive sleep apnea)   Hyperthyroidism   S/P right total knee replacement   DM (diabetes mellitus) type II controlled with renal manifestation ([name])   PAD (peripheral artery disease) ([name])   ESRD on hemodialysis ([place])   Essential hypertension   Chronic combined systolic (congestive) and diastolic  (congestive) heart failure ([name])   Scheduled Meds: . allopurinol  100 mg Oral Daily  . aspirin  325 mg Oral Daily  . Chlorhexidine Gluconate Cloth  6 each Topical Q0600  . Chlorhexidine Gluconate Cloth  6 each Topical Q0600  . cinacalcet  90 mg Oral BID WC  . citalopram  10 mg Oral QHS  . colchicine  0.6 mg Oral Daily  . colesevelam  1,875 mg Oral BID WC  . ezetimibe  10 mg Oral Daily  . feeding supplement (NEPRO CARB STEADY)  237 mL Oral BID BM  . ferric citrate  420 mg Oral TID WC  . gabapentin  100 mg Oral QHS  . heparin  5,000 Units Subcutaneous Q8H  . insulin aspart  0-9 Units Subcutaneous TID WC  . methimazole  2.5 mg Oral Daily  . multivitamin  1 tablet Oral QHS  . polyethylene glycol  17 g Oral Daily  . predniSONE  10 mg Oral BID WC  . sodium zirconium cyclosilicate  10 g Oral Once  . vancomycin  125 mg Oral Q72H   Continuous Infusions: . sodium chloride    . sodium chloride    . cefTRIAXone (ROCEPHIN)  IV 2 g (10/07/18 2059)   PRN Meds:.sodium chloride, sodium chloride, acetaminophen **OR** acetaminophen, alteplase, heparin, heparin, lidocaine (PF), lidocaine-prilocaine, ondansetron **OR** ondansetron (ZOFRAN) IV, pentafluoroprop-tetrafluoroeth   SUBJECTIVE: Patient states he continues to feel generally weak with concerns about his repeated difficulty walking and passing out. Diarrhea is improving (3-4 episodes per day). Left wrist remains tender.  Review of Systems: Review of Systems  Constitutional: Negative for chills and fever.  Eyes: Negative for blurred vision.  Respiratory: Negative for shortness of breath.   Cardiovascular: Negative for chest pain.  Gastrointestinal: Positive for diarrhea.  Genitourinary: Negative for dysuria.  Neurological: Negative for dizziness.       Generalized Weakness    Allergies  Allergen Reactions  . Cephalexin Swelling and Other (See Comments)    Tolerated Rocephin 10/19. Tongue swelling, but no breathing  issues Tolerated Zosyn in Sept 2019 without any issues  . Statins Other (See Comments)    Weak muscles  . Ciprofloxacin Rash    OBJECTIVE: Vitals:   10/07/18 1220 10/07/18 1350 10/07/18 2037 10/08/18 0606  BP: (!) 145/55 (!) 134/92 (!) 86/57 133/73  Pulse: 62 70 78 72  Resp: 16 18 20 18   Temp: 97.7 F (36.5 C) 98.2 F (36.8 C) 98 F (36.7 C) 98.5 F (36.9 C)  TempSrc: Oral Oral Oral Oral  SpO2: 98% 97% 92% 98%  Weight: 101.6 kg   95.8 kg  Height:       Body mass index is 27.85 kg/m.  Physical Exam Constitutional:      General: He is not in acute distress.    Appearance: Normal appearance.  HENT:     Head: Normocephalic and atraumatic.     Mouth/Throat:     Mouth: Mucous membranes are moist.     Pharynx: No posterior oropharyngeal erythema.     Comments: Poor Dentition Cardiovascular:     Rate and Rhythm: Normal rate and regular rhythm.     Pulses: Normal pulses.     Heart sounds: Normal heart sounds.  Pulmonary:     Effort: Pulmonary effort is normal. No respiratory distress.     Breath sounds: Normal breath sounds.  Abdominal:     General: Bowel sounds are normal. There is no distension.     Palpations: Abdomen is soft.     Tenderness: There is no abdominal tenderness.  Musculoskeletal:        General: No swelling or deformity.     Comments: Mild left wrist tenderness to palpation  Skin:    General: Skin is warm and dry.     Lab Results Lab Results  Component Value Date   WBC 7.2 10/08/2018   HGB 12.8 (L) 10/08/2018   HCT 40.8 10/08/2018   MCV 94.2 10/08/2018   PLT 278 10/08/2018    Lab Results  Component Value Date   CREATININE 9.36 (H) 10/08/2018   BUN 50 (H) 10/08/2018   NA 137 10/08/2018   K 4.8 10/08/2018   CL 100 10/08/2018   CO2 22 10/08/2018    Lab Results  Component Value Date   ALT 165 (H) 10/06/2018   AST 50 (H) 10/06/2018   ALKPHOS 193 (H) 10/06/2018   BILITOT 0.7 10/06/2018     Microbiology: Recent Results (from the past  240 hour(s))  Culture, blood (routine x 2)     Status: Abnormal   Collection Time: 10/04/18 12:01 AM  Result Value Ref Range Status   Specimen Description BLOOD LEFT ANTECUBITAL  Final   Special Requests   Final    BOTTLES DRAWN AEROBIC AND ANAEROBIC Blood Culture results may not be optimal due to an inadequate volume of blood received in culture bottles   Culture  Setup Time   Final    GRAM NEGATIVE RODS IN BOTH AEROBIC AND ANAEROBIC BOTTLES CRITICAL RESULT CALLED TO, READ BACK BY AND VERIFIED WITH: Earnest Rosier [id number] [place] Performed  at [facility], [place] [address] [place], [place] [address]    Culture (A)  Final    KLEBSIELLA PNEUMONIAE SUSCEPTIBILITIES PERFORMED ON PREVIOUS CULTURE WITHIN THE LAST 5 DAYS.    Report Status 10/07/2018 FINAL  Final  Blood Culture ID Panel (Reflexed)     Status: Abnormal   Collection Time: 10/04/18 12:01 AM  Result Value Ref Range Status   Enterococcus species NOT DETECTED NOT DETECTED Final   Listeria monocytogenes NOT DETECTED NOT DETECTED Final   Staphylococcus species NOT DETECTED NOT DETECTED Final   Staphylococcus aureus (BCID) NOT DETECTED NOT DETECTED Final   Streptococcus species NOT DETECTED NOT DETECTED Final   Streptococcus agalactiae NOT DETECTED NOT DETECTED Final   Streptococcus pneumoniae NOT DETECTED NOT DETECTED Final   Streptococcus pyogenes NOT DETECTED NOT DETECTED Final   Acinetobacter baumannii NOT DETECTED NOT DETECTED Final   Enterobacteriaceae species DETECTED (A) NOT DETECTED Final    Comment: Enterobacteriaceae represent a large family of gram-negative bacteria, not a single organism. CRITICAL RESULT CALLED TO, READ BACK BY AND VERIFIED WITH: [place] [phone] FCP    Enterobacter cloacae complex NOT DETECTED NOT DETECTED Final   Escherichia coli NOT DETECTED NOT DETECTED Final   Klebsiella oxytoca NOT DETECTED NOT DETECTED Final   Klebsiella pneumoniae DETECTED (A) NOT DETECTED Final     Comment: CRITICAL RESULT CALLED TO, READ BACK BY AND VERIFIED WITH: PHARMD ANDREW MEYER [phone] FCP    Proteus species NOT DETECTED NOT DETECTED Final   Serratia marcescens NOT DETECTED NOT DETECTED Final   Carbapenem resistance NOT DETECTED NOT DETECTED Final   Haemophilus influenzae NOT DETECTED NOT DETECTED Final   Neisseria meningitidis NOT DETECTED NOT DETECTED Final   Pseudomonas aeruginosa NOT DETECTED NOT DETECTED Final   Candida albicans NOT DETECTED NOT DETECTED Final   Candida glabrata NOT DETECTED NOT DETECTED Final   Candida krusei NOT DETECTED NOT DETECTED Final   Candida parapsilosis NOT DETECTED NOT DETECTED Final   Candida tropicalis NOT DETECTED NOT DETECTED Final  Culture, blood (routine x 2)     Status: Abnormal   Collection Time: 10/04/18 12:07 AM  Result Value Ref Range Status   Specimen Description BLOOD LEFT FOREARM  Final   Special Requests   Final    BOTTLES DRAWN AEROBIC AND ANAEROBIC Blood Culture results may not be optimal due to an inadequate volume of blood received in culture bottles Performed at [facility], [address], [place], [place] [address]    Culture  Setup Time   Final    GRAM NEGATIVE RODS IN BOTH AEROBIC AND ANAEROBIC BOTTLES CRITICAL VALUE NOTED.  VALUE IS CONSISTENT WITH PREVIOUSLY REPORTED AND CALLED VALUE.    Culture KLEBSIELLA PNEUMONIAE (A)  Final   Report Status 10/07/2018 FINAL  Final   Organism ID, Bacteria KLEBSIELLA PNEUMONIAE  Final      Susceptibility   Klebsiella pneumoniae - MIC*    AMPICILLIN >=32 RESISTANT Resistant     CEFAZOLIN <=4 SENSITIVE Sensitive     CEFEPIME <=1 SENSITIVE Sensitive     CEFTAZIDIME <=1 SENSITIVE Sensitive     CEFTRIAXONE <=1 SENSITIVE Sensitive     CIPROFLOXACIN <=0.25 SENSITIVE Sensitive     GENTAMICIN <=1 SENSITIVE Sensitive     IMIPENEM <=0.25 SENSITIVE Sensitive     TRIMETH/SULFA <=20 SENSITIVE Sensitive     AMPICILLIN/SULBACTAM 8 SENSITIVE Sensitive     PIP/TAZO 16  SENSITIVE Sensitive     Extended ESBL NEGATIVE Sensitive     *  KLEBSIELLA PNEUMONIAE  MRSA PCR Screening     Status: None   Collection Time: 10/04/18  6:06 PM  Result Value Ref Range Status   MRSA by PCR NEGATIVE NEGATIVE Final    Comment:        The GeneXpert MRSA Assay (FDA approved for NASAL specimens only), is one component of a comprehensive MRSA colonization surveillance program. It is not intended to diagnose MRSA infection nor to guide or monitor treatment for MRSA infections. Performed at [facility], [place] [address] [place], [place] [address]   Culture, blood (Routine X 2) w Reflex to ID Panel     Status: None (Preliminary result)   Collection Time: 10/06/18  5:35 AM  Result Value Ref Range Status   Specimen Description BLOOD LEFT HAND  Final   Special Requests   Final    BOTTLES DRAWN AEROBIC ONLY Blood Culture adequate volume   Culture   Final    NO GROWTH 2 DAYS Performed at [facility], [place] [address] [place], [place] [address]    Report Status PENDING  Incomplete    Neva Seat, MD IM PGY-2 [phone] 10/08/2018, 10:21 AM

## 2018-10-08 NOTE — Progress Notes (Signed)
Call placed to Dr. Johnsie Cancel regarding Dr. Megan Salon recommendation in attestation from 10/07/2018 that TEE is not recommended. Per Dr. Johnsie Cancel cancel TEE. Patient primary RN aware.

## 2018-10-09 DIAGNOSIS — I63449 Cerebral infarction due to embolism of unspecified cerebellar artery: Secondary | ICD-10-CM

## 2018-10-09 DIAGNOSIS — E0590 Thyrotoxicosis, unspecified without thyrotoxic crisis or storm: Secondary | ICD-10-CM

## 2018-10-09 LAB — RENAL FUNCTION PANEL
Albumin: 2.9 g/dL — ABNORMAL LOW (ref 3.5–5.0)
Anion gap: 16 — ABNORMAL HIGH (ref 5–15)
BUN: 70 mg/dL — AB (ref 8–23)
CO2: 21 mmol/L — ABNORMAL LOW (ref 22–32)
Calcium: 8.3 mg/dL — ABNORMAL LOW (ref 8.9–10.3)
Chloride: 99 mmol/L (ref 98–111)
Creatinine, Ser: 11.15 mg/dL — ABNORMAL HIGH (ref 0.61–1.24)
GFR calc Af Amer: 5 mL/min — ABNORMAL LOW (ref >60)
GFR calc non Af Amer: 4 mL/min — ABNORMAL LOW (ref >60)
Glucose, Bld: 137 mg/dL — ABNORMAL HIGH (ref 70–99)
POTASSIUM: 4.5 mmol/L (ref 3.5–5.1)
Phosphorus: 6.5 mg/dL — ABNORMAL HIGH (ref 2.5–4.6)
Sodium: 136 mmol/L (ref 135–145)

## 2018-10-09 LAB — HEPATIC FUNCTION PANEL
ALT: 59 U/L — AB (ref 0–44)
AST: 17 U/L (ref 15–41)
Albumin: 2.9 g/dL — ABNORMAL LOW (ref 3.5–5.0)
Alkaline Phosphatase: 132 U/L — ABNORMAL HIGH (ref 38–126)
BILIRUBIN DIRECT: 0.1 mg/dL (ref 0.0–0.2)
Indirect Bilirubin: 0.6 mg/dL (ref 0.3–0.9)
Total Bilirubin: 0.7 mg/dL (ref 0.3–1.2)
Total Protein: 6.9 g/dL (ref 6.5–8.1)

## 2018-10-09 LAB — CBC
HCT: 39.1 % (ref 39.0–52.0)
HEMOGLOBIN: 12 g/dL — AB (ref 13.0–17.0)
MCH: 28.6 pg (ref 26.0–34.0)
MCHC: 30.7 g/dL (ref 30.0–36.0)
MCV: 93.1 fL (ref 80.0–100.0)
Platelets: 286 10*3/uL (ref 150–400)
RBC: 4.2 MIL/uL — ABNORMAL LOW (ref 4.22–5.81)
RDW: 17.9 % — ABNORMAL HIGH (ref 11.5–15.5)
WBC: 8.5 10*3/uL (ref 4.0–10.5)
nRBC: 0 % (ref 0.0–0.2)

## 2018-10-09 LAB — HEPATITIS B SURFACE ANTIGEN: Hepatitis B Surface Ag: NEGATIVE

## 2018-10-09 LAB — GLUCOSE, CAPILLARY: Glucose-Capillary: 116 mg/dL — ABNORMAL HIGH (ref 70–99)

## 2018-10-09 MED ORDER — PREDNISONE 10 MG PO TABS: 10.0000 mg | ORAL_TABLET | Freq: Every day | ORAL | 0 refills | Status: AC

## 2018-10-09 MED ORDER — CEPHALEXIN 500 MG PO CAPS: 500.0000 mg | ORAL_CAPSULE | Freq: Two times a day (BID) | ORAL | 0 refills | Status: DC

## 2018-10-09 MED ORDER — ASPIRIN EC 325 MG PO TBEC: 325.0000 mg | DELAYED_RELEASE_TABLET | Freq: Every day | ORAL | 0 refills | Status: AC

## 2018-10-09 MED ORDER — CEPHALEXIN 250 MG PO CAPS
500.0000 mg | ORAL_CAPSULE | Freq: Two times a day (BID) | ORAL | Status: DC
  Administered 2018-10-09: 500 mg via ORAL
  Filled 2018-10-09: qty 2
  Filled 2018-10-09: qty 1

## 2018-10-09 NOTE — Procedures (Signed)
Pt seen on HD. Stable.   I was present at this dialysis session, have reviewed the session itself and made  appropriate changes Kelly Splinter MD [place] pager [phone]   10/09/2018, 1:13 PM

## 2018-10-09 NOTE — Progress Notes (Signed)
Patient back from hemodialysis.

## 2018-10-09 NOTE — Discharge Instructions (Signed)

## 2018-10-09 NOTE — Care Management Note (Signed)
Case Management Note  Patient Details  Name: [name] MRN: [id number] Date of Birth: 1951-06-21            Action/Plan: CM talked to patient about [facility] choices patient requested [facility]; Tiffany with [facility] called for arrangements.  Expected Discharge Date:  10/09/18               Expected Discharge Plan:  [place] Discharge planning Services  CM Consult Choice offered to:  Patient  HH Arranged:  PT [place] Agency:  [facility]  Status of Service:  In process, will continue to follow  Sherrilyn Rist [phone] 10/09/2018, 3:41 PM

## 2018-10-09 NOTE — Progress Notes (Signed)
PT Cancellation Note  Patient Details Name: DRAYLON MERCADEL MRN: [id number] DOB: 03-14-1951   Cancelled Treatment:    Reason Eval/Treat Not Completed: Patient at procedure or test/unavailable (HD). Will follow-up for PT treatment as schedule permits.  Mabeline Caras, PT, DPT Acute Rehabilitation Services  Pager [phone] Office [place] 10/09/2018, 9:01 AM

## 2018-10-09 NOTE — Progress Notes (Signed)
[name] for Infectious Disease  Date of Admission:  10/03/2018   Total days of antibiotics 6        Day 2, Cephalexin  ASSESSMENT: [name] is a 68 year old male who presented with altered mental status and hypoxemia (which resolved early in his course) and small ischemic infarct on MRI. His blood cultures on 10/04/18 were positive for Klebsiella. He has previously had Klebsiella bacteremia in October and November of 2019. No clear source has been identified despite detailed workup: Negative TEE and R AVF U/S in November; Negative Left wrist aspiration culture in December (for on going wrist pain); Negative TTE, R AVF, CT Abdomen, CTA Chest this admission, and CT/MRI Brain. He did aquired C. Diff Colitis while hospitalized in November and remains on prolonged Vancomycin Taper due to ongoing diarrhea. Patient has remained afebrile this admission and WBC has downtrended to normal. He has tolerated cephalosporins well recently despite listed allergy and has been transitioned to keflex.  PLAN:  1. Klebsiella Bacteremia - Discharge on PO Cephalexin for 14 day course - Blood Cultures 10/04/18: Klebsiella Pneumoniae, resistant to ampicillin - Repeat Blood Cultures 10/06/18: NG in 3d - No vegetation on TTE, No TEE recommended - ID follow up before completion of current antibiotic course  2. C. Difficile Colitis - Continue PO Vancomycin taper - Extend course to 1 week after end of current antibiotic therapy, can be done at ID follow up  Principal Problem:   Bacteremia due to Klebsiella pneumoniae Active Problems:   C. difficile colitis   Cerebral embolism with cerebral infarction   Thyrotoxicosis   CAD S/P percutaneous coronary angioplasty   OSA (obstructive sleep apnea)   Hyperthyroidism   S/P right total knee replacement   DM (diabetes mellitus) type II controlled with renal manifestation ([name])   PAD (peripheral artery disease) ([name])   ESRD on hemodialysis ([place])   Essential  hypertension   Chronic combined systolic (congestive) and diastolic (congestive) heart failure ([name])   Scheduled Meds: . allopurinol  100 mg Oral Daily  . aspirin  325 mg Oral Daily  . cephALEXin  500 mg Oral QHS  . Chlorhexidine Gluconate Cloth  6 each Topical Q0600  . Chlorhexidine Gluconate Cloth  6 each Topical Q0600  . Chlorhexidine Gluconate Cloth  6 each Topical Q0600  . cinacalcet  90 mg Oral BID WC  . citalopram  10 mg Oral QHS  . colchicine  0.6 mg Oral Daily  . colesevelam  1,875 mg Oral BID WC  . ezetimibe  10 mg Oral Daily  . feeding supplement (NEPRO CARB STEADY)  237 mL Oral BID BM  . ferric citrate  420 mg Oral TID WC  . gabapentin  100 mg Oral QHS  . heparin  5,000 Units Subcutaneous Q8H  . insulin aspart  0-9 Units Subcutaneous TID WC  . methimazole  2.5 mg Oral Daily  . multivitamin  1 tablet Oral QHS  . polyethylene glycol  17 g Oral Daily  . predniSONE  10 mg Oral BID WC  . sodium zirconium cyclosilicate  10 g Oral Once  . vancomycin  125 mg Oral Q72H   Continuous Infusions: . sodium chloride    . sodium chloride     PRN Meds:.sodium chloride, sodium chloride, acetaminophen **OR** acetaminophen, alteplase, heparin, heparin, lidocaine (PF), lidocaine-prilocaine, ondansetron **OR** ondansetron (ZOFRAN) IV, pentafluoroprop-tetrafluoroeth   SUBJECTIVE: Patient states he feels okay today. Continues to feel generally weak. Diarrhea continue to  improve (2-3 episodes per day). He tolerated cephalexin well overnight and is comfortable being discharged on this medication.  Review of Systems: Review of Systems  Constitutional: Negative for chills and fever.  Eyes: Negative for blurred vision.  Respiratory: Negative for shortness of breath.   Cardiovascular: Negative for chest pain.  Gastrointestinal: Positive for diarrhea.  Genitourinary: Negative for dysuria.  Neurological: Negative for dizziness.       Generalized Weakness   Allergies  Allergen Reactions    . Cephalexin Swelling and Other (See Comments)    Tolerated Rocephin 10/19. Tongue swelling, but no breathing issues Tolerated Zosyn in Sept 2019 without any issues  . Statins Other (See Comments)    Weak muscles  . Ciprofloxacin Rash    OBJECTIVE: Vitals:   10/09/18 0502 10/09/18 0650 10/09/18 0702 10/09/18 0715  BP: (!) 159/83 (!) (P) 179/94 (!) (P) 166/91 (!) (P) 150/89  Pulse: 65 (P) 67 (P) 61 (P) 63  Resp: 20 (P) 16    Temp: 98 F (36.7 C) (P) 97.6 F (36.4 C)    TempSrc:  (P) Oral    SpO2: 93%     Weight: 97 kg (P) 97 kg    Height:       Body mass index is 28.21 kg/m (pended).  Physical Exam Constitutional:      General: He is not in acute distress.    Appearance: Normal appearance.  HENT:     Head: Normocephalic and atraumatic.     Mouth/Throat:     Mouth: Mucous membranes are moist.     Pharynx: No posterior oropharyngeal erythema.     Comments: Poor Dentition Cardiovascular:     Rate and Rhythm: Normal rate and regular rhythm.     Pulses: Normal pulses.     Heart sounds: Normal heart sounds.  Pulmonary:     Effort: Pulmonary effort is normal. No respiratory distress.     Breath sounds: Normal breath sounds.  Abdominal:     General: Bowel sounds are normal. There is no distension.     Palpations: Abdomen is soft.     Tenderness: There is no abdominal tenderness.  Musculoskeletal:        General: No swelling or deformity.     Comments: Mild left wrist tenderness to palpation  Skin:    General: Skin is warm and dry.     Lab Results Lab Results  Component Value Date   WBC 8.5 10/09/2018   HGB 12.0 (L) 10/09/2018   HCT 39.1 10/09/2018   MCV 93.1 10/09/2018   PLT 286 10/09/2018    Lab Results  Component Value Date   CREATININE 9.36 (H) 10/08/2018   BUN 50 (H) 10/08/2018   NA 137 10/08/2018   K 4.8 10/08/2018   CL 100 10/08/2018   CO2 22 10/08/2018    Lab Results  Component Value Date   ALT 59 (H) 10/09/2018   AST 17 10/09/2018    ALKPHOS 132 (H) 10/09/2018   BILITOT 0.7 10/09/2018     Microbiology: Recent Results (from the past 240 hour(s))  Culture, blood (routine x 2)     Status: Abnormal   Collection Time: 10/04/18 12:01 AM  Result Value Ref Range Status   Specimen Description BLOOD LEFT ANTECUBITAL  Final   Special Requests   Final    BOTTLES DRAWN AEROBIC AND ANAEROBIC Blood Culture results may not be optimal due to an inadequate volume of blood received in culture bottles   Culture  Setup Time  Final    GRAM NEGATIVE RODS IN BOTH AEROBIC AND ANAEROBIC BOTTLES CRITICAL RESULT CALLED TO, READ BACK BY AND VERIFIED WITH: Earnest Rosier [phone] [place] Performed at [facility], [place] [address] [place], [place] [address]    Culture (A)  Final    KLEBSIELLA PNEUMONIAE SUSCEPTIBILITIES PERFORMED ON PREVIOUS CULTURE WITHIN THE LAST 5 DAYS.    Report Status 10/07/2018 FINAL  Final  Blood Culture ID Panel (Reflexed)     Status: Abnormal   Collection Time: 10/04/18 12:01 AM  Result Value Ref Range Status   Enterococcus species NOT DETECTED NOT DETECTED Final   Listeria monocytogenes NOT DETECTED NOT DETECTED Final   Staphylococcus species NOT DETECTED NOT DETECTED Final   Staphylococcus aureus (BCID) NOT DETECTED NOT DETECTED Final   Streptococcus species NOT DETECTED NOT DETECTED Final   Streptococcus agalactiae NOT DETECTED NOT DETECTED Final   Streptococcus pneumoniae NOT DETECTED NOT DETECTED Final   Streptococcus pyogenes NOT DETECTED NOT DETECTED Final   Acinetobacter baumannii NOT DETECTED NOT DETECTED Final   Enterobacteriaceae species DETECTED (A) NOT DETECTED Final    Comment: Enterobacteriaceae represent a large family of gram-negative bacteria, not a single organism. CRITICAL RESULT CALLED TO, READ BACK BY AND VERIFIED WITH: [place] [id number] FCP    Enterobacter cloacae complex NOT DETECTED NOT DETECTED Final   Escherichia coli NOT DETECTED NOT DETECTED Final    Klebsiella oxytoca NOT DETECTED NOT DETECTED Final   Klebsiella pneumoniae DETECTED (A) NOT DETECTED Final    Comment: CRITICAL RESULT CALLED TO, READ BACK BY AND VERIFIED WITH: PHARMD ANDREW MEYER [phone] FCP    Proteus species NOT DETECTED NOT DETECTED Final   Serratia marcescens NOT DETECTED NOT DETECTED Final   Carbapenem resistance NOT DETECTED NOT DETECTED Final   Haemophilus influenzae NOT DETECTED NOT DETECTED Final   Neisseria meningitidis NOT DETECTED NOT DETECTED Final   Pseudomonas aeruginosa NOT DETECTED NOT DETECTED Final   Candida albicans NOT DETECTED NOT DETECTED Final   Candida glabrata NOT DETECTED NOT DETECTED Final   Candida krusei NOT DETECTED NOT DETECTED Final   Candida parapsilosis NOT DETECTED NOT DETECTED Final   Candida tropicalis NOT DETECTED NOT DETECTED Final  Culture, blood (routine x 2)     Status: Abnormal   Collection Time: 10/04/18 12:07 AM  Result Value Ref Range Status   Specimen Description BLOOD LEFT FOREARM  Final   Special Requests   Final    BOTTLES DRAWN AEROBIC AND ANAEROBIC Blood Culture results may not be optimal due to an inadequate volume of blood received in culture bottles Performed at [facility], [address] [place], [place] [address]    Culture  Setup Time   Final    GRAM NEGATIVE RODS IN BOTH AEROBIC AND ANAEROBIC BOTTLES CRITICAL VALUE NOTED.  VALUE IS CONSISTENT WITH PREVIOUSLY REPORTED AND CALLED VALUE.    Culture KLEBSIELLA PNEUMONIAE (A)  Final   Report Status 10/07/2018 FINAL  Final   Organism ID, Bacteria KLEBSIELLA PNEUMONIAE  Final      Susceptibility   Klebsiella pneumoniae - MIC*    AMPICILLIN >=32 RESISTANT Resistant     CEFAZOLIN <=4 SENSITIVE Sensitive     CEFEPIME <=1 SENSITIVE Sensitive     CEFTAZIDIME <=1 SENSITIVE Sensitive     CEFTRIAXONE <=1 SENSITIVE Sensitive     CIPROFLOXACIN <=0.25 SENSITIVE Sensitive     GENTAMICIN <=1 SENSITIVE Sensitive     IMIPENEM <=0.25 SENSITIVE Sensitive  TRIMETH/SULFA <=20 SENSITIVE Sensitive     AMPICILLIN/SULBACTAM 8 SENSITIVE Sensitive     PIP/TAZO 16 SENSITIVE Sensitive     Extended ESBL NEGATIVE Sensitive     * KLEBSIELLA PNEUMONIAE  MRSA PCR Screening     Status: None   Collection Time: 10/04/18  6:06 PM  Result Value Ref Range Status   MRSA by PCR NEGATIVE NEGATIVE Final    Comment:        The GeneXpert MRSA Assay (FDA approved for NASAL specimens only), is one component of a comprehensive MRSA colonization surveillance program. It is not intended to diagnose MRSA infection nor to guide or monitor treatment for MRSA infections. Performed at [facility], [place] [address] [place], [place] [address]   Culture, blood (Routine X 2) w Reflex to ID Panel     Status: None (Preliminary result)   Collection Time: 10/06/18  5:35 AM  Result Value Ref Range Status   Specimen Description BLOOD LEFT HAND  Final   Special Requests   Final    BOTTLES DRAWN AEROBIC ONLY Blood Culture adequate volume   Culture   Final    NO GROWTH 3 DAYS Performed at [facility], [place] [address], [place], [place] [address]    Report Status PENDING  Incomplete    Neva Seat, MD IM PGY-2 [phone] 10/09/2018, 7:44 AM

## 2018-10-09 NOTE — Discharge Summary (Signed)
Physician Discharge Summary  [name]:[id number] DOB: Jun 15, 1951  PCP: Biagio Borg, MD  Admit date: 10/03/2018 Discharge date: 10/09/2018  Recommendations for Outpatient Follow-up:  1. Dr. [name], PCP on 10/15/2018 at 1:40 PM.  To be seen with repeat labs (CBC & BMP) which can be drawn across his regular hemodialysis. 2. Hemodialysis Center: Continue regular hemodialysis on Tuesdays, Thursdays and Saturdays. 3. Janene Madeira, NP/ID on 10/16/2018 at 3 PM. 4. Dr. Antony Contras, Neurology in 4 weeks.  Ambulatory Neurology referral sent via [facility].  Home Health: PT Equipment/Devices: None  Discharge Condition: Improved and stable CODE STATUS: Full Diet recommendation: Heart healthy & diabetic diet.  Discharge Diagnoses:  Principal Problem:   Bacteremia due to Klebsiella pneumoniae Active Problems:   Thyrotoxicosis   CAD S/P percutaneous coronary angioplasty   OSA (obstructive sleep apnea)   Hyperthyroidism   S/P right total knee replacement   DM (diabetes mellitus) type II controlled with renal manifestation ([name])   PAD (peripheral artery disease) ([name])   ESRD on hemodialysis ([name])   Essential hypertension   Chronic combined systolic (congestive) and diastolic (congestive) heart failure ([name])   C. difficile colitis   Cerebral embolism with cerebral infarction   Brief Summary: [name] a 68 y.o.malewithESRD on hemodialysis on Tuesday Thursday Saturday, CAD, hypothyroidism, gout, recently admitted for sepsis and has had multiple episodes of bacteremia and also recently treated for C. difficile still on vancomycin was not feeling well for last couple of days feeling tired and weak.  Evening prior to admission patient found it difficult to walk and had to rest on his bed following which patient wife found that patient was very confused and had a bowel movement. After that it was difficult to make him walk and EMS was called. Patient remained confused at least for an  hour or 2. Prior to which patient also had some chest pressure. Patient wife states that patient has had left wrist gout attack and has started back on prednisone yesterday which patient took first dose.  After admission, blood culture returned with Klebsiella pneumonia.  Assessment & Plan:   Recurrent Klebsiella bacteremia Unclear source Infectious disease consulted and assisted with management. Right upper extremity ultrasound without abnormal fluid collection or abscess surrounding fistula As per ID follow-up, no source of infection has been found despite extensive evaluation over the past several months.  No evidence of pneumonia, UTI, intra-abdominal infection, AV fistula infection or left wrist infection.  He had a negative TEE last month.  As per ID, Klebsiella is unlikely to cause endocarditis and they did not feel that he needed repeat TEE. Treated in the hospital with IV ceftriaxone. Blood cultures x2 from 1/6: Negative to date. I discussed with Dr. Megan Salon on 1/8 who recommended transitioning to oral Keflex for a total of 14 more days beginning 1/8.    Close ID follow-up has been arranged.  Although Keflex was listed as allergy, patient has tolerated other cephalosporins without side effects.  He again tolerated Keflex last night and today without side effects.  Pharmacy discussed with Dr. Megan Salon and have adjusted dose of Keflex to 500 mg twice daily. Improved and stable.   Right cerebellar infarct Neurology consulted, signed off 1/6 MRA head no large vessel occlusion Carotid Doppler 1 - 39% stenosis bilaterally Echocardiogram without cardiac source of emboli identified EEG normal EEG, no focal lateralizing or epileptiform features PT OT recommending home health Continue aspirin 325 mg daily. Outpatient follow-up with neurology.  Chest pain  Troponinflat trend 0.04 CTA chest negative for PE Echo EF 45 to 50% diffuse hypokinesis, grade 1 diastolic dysfunction,  similar to echocardiogram in November 2019 Cardiology consulted, no further ischemic work-up planned No recurrence of chest pain reported.  Elevated LFT Acute hepatitis panel negative Right upper quadrant ultrasound showed pneumobilia which is chronic in nature post cholecystectomy Trending downward and have almost normalized  C. difficile Continue vancomycin, this was diagnosed prior to this admission.  As per discussion with Dr. Megan Salon, patient is to continue oral vancomycin for a week after he completes course of Keflex.  Upon follow-up with ID, patient may need a new prescription for continued vancomycin.  Gout of left wrist Treated with prednisone, allopurinol, colchicine.  Acute flare has resolved.  Wean off of prednisone in the next few days.  Diabetes mellitus type 2 Patient not on hypoglycemics PTA.  Here likely precipitated due to prednisone for gout.  A1c 6 on 1/4.  Hyperglycemia should improve after steroids have been tapered off.  ESRD on dialysisTTS Nephrology consulted and assisted with HD needs.  He completed his dialysis today.  Depression Continue Celexa  Hyperthyroidism Continue methimazole.  Clinically euthyroid.  OSA CPAP nightly.  Does not use home oxygen.  Syncope As per patient and spouse, patient has passed out at least 3 to 4 times in the last couple of months and once prior to this admission.  This happened when he stands up to walk or even happened once while sitting in chair.  It could have been related to sepsis and related hemodynamics but etiology not fully clear.  TTE shows low EF 45-50%, same as prior.  Telemetry shows no arrhythmias.  Negative orthostatic blood pressures on 1/8.  Patient does not drive and was counseled that he should not for at least 6 months.  As discussed with them, if he has recurrent episodes cardiology may need to consider heart monitor.  He follows with Dr. [name] and Dr. Curt Bears.  Cardiomyopathy Follows  outpatient with Cardiology, Dr. [name] and Dr. Curt Bears.   Consultants:   Nephrology  Cardiology  Neurology  Infectious disease  Procedures:   HD   Discharge Instructions  Discharge Instructions    Ambulatory referral to Neurology   Complete by:  As directed    An appointment is requested in approximately: 4 weeks.   Call MD for:   Complete by:  As directed    Passing out or feeling like passing out.   Call MD for:  difficulty breathing, headache or visual disturbances   Complete by:  As directed    Call MD for:  extreme fatigue   Complete by:  As directed    Call MD for:  persistant dizziness or light-headedness   Complete by:  As directed    Call MD for:  persistant nausea and vomiting   Complete by:  As directed    Call MD for:  severe uncontrolled pain   Complete by:  As directed    Call MD for:  temperature >100.4   Complete by:  As directed    Diet - low sodium heart healthy   Complete by:  As directed    Diet Carb Modified   Complete by:  As directed    Driving Restrictions   Complete by:  As directed    No driving for 6 months.   Increase activity slowly   Complete by:  As directed        Medication List    TAKE these medications  acetaminophen 325 MG tablet Commonly known as:  TYLENOL Take 650 mg by mouth every 6 (six) hours as needed for mild pain or headache.   Alirocumab 150 MG/ML Sopn Commonly known as:  PRALUENT Inject 150 mg into the skin every 14 (fourteen) days.   allopurinol 100 MG tablet Commonly known as:  ZYLOPRIM TAKE ONE TABLET BY MOUTH ONCE DAILY   aspirin EC 325 MG tablet Take 1 tablet (325 mg total) by mouth daily. Start taking on:  October 10, 2018 What changed:    medication strength  how much to take   betamethasone dipropionate 0.05 % cream Commonly known as:  DIPROLENE Apply 1 application topically daily as needed (leg sores).   cephALEXin 500 MG capsule Commonly known as:  KEFLEX Take 1 capsule (500  mg total) by mouth 2 (two) times daily for 13 days.   citalopram 10 MG tablet Commonly known as:  CELEXA Take 1 tablet (10 mg total) by mouth at bedtime.   colchicine 0.6 MG tablet Take 1 tablet (0.6 mg total) by mouth daily.   doxercalciferol 4 MCG/2ML injection Commonly known as:  [name] Inject 1 mL (2 mcg total) into the vein Every Tuesday,Thursday,and Saturday with dialysis.   ezetimibe 10 MG tablet Commonly known as:  ZETIA TAKE ONE TABLET BY MOUTH ONCE DAILY   feeding supplement (NEPRO CARB STEADY) Liqd Take 237 mLs by mouth 2 (two) times daily between meals.   ferric citrate 1 GM 210 MG(Fe) tablet Commonly known as:  AURYXIA Take 2 tablets (420 mg total) by mouth 3 (three) times daily with meals.   gabapentin 100 MG capsule Commonly known as:  NEURONTIN Take 1 capsule (100 mg total) by mouth at bedtime.   hydroxypropyl methylcellulose / hypromellose 2.5 % ophthalmic solution Commonly known as:  ISOPTO TEARS / GONIOVISC Place 1 drop into both eyes 3 (three) times daily as needed for dry eyes.   methimazole 5 MG tablet Commonly known as:  TAPAZOLE Take 0.5 tablets (2.5 mg total) by mouth daily.   multivitamin Tabs tablet Take 1 tablet by mouth daily. Reported on 10/24/2015   polyethylene glycol packet Commonly known as:  MIRALAX / GLYCOLAX Take 17 g by mouth daily.   predniSONE 10 MG tablet Commonly known as:  DELTASONE Take 1 tablet (10 mg total) by mouth daily with breakfast for 5 days. Start taking on:  October 10, 2018   SENSIPAR 90 MG tablet Generic drug:  cinacalcet Take 90 mg by mouth 2 (two) times daily.   vancomycin 125 MG capsule Commonly known as:  VANCOCIN Take 125 mg by mouth every other day. Starting 09/19/18 take 1 tablet every other day for 8 days. Started 10/07/2018 take 1 tablet every three days for 2 weeks.   WELCHOL 625 MG tablet Generic drug:  colesevelam TAKE THREE TABLETS BY MOUTH TWICE DAILY WITH MEALS What changed:    how much  to take  how to take this  when to take this  additional instructions   zolpidem 5 MG tablet Commonly known as:  AMBIEN Take 1 tablet (5 mg total) by mouth at bedtime as needed. for sleep What changed:    reasons to take this  additional instructions      Follow-up Information    Biagio Borg, MD Follow up on 10/15/2018.   Specialties:  Internal Medicine, Radiology Why:  1:40 PM.  To be seen with repeat labs (CBC & BMP).  The labs can be drawn across his regular hemodialysis. Contact  information: [place] [address] [phone]        Constance Haw, MD .   Specialty:  Cardiology Contact information: [address] [place] [address] [place] [address] [place] Follow up.   Why:  Continue regular hemodialysis appointments on Tuesdays, Thursdays and Saturdays.       [place] Callas, NP Follow up on 10/16/2018.   Specialty:  Infectious Diseases Why:  3 PM. Contact information: [place] [address] [phone]        Garvin Fila, MD. Schedule an appointment as soon as possible for a visit in 4 week(s).   Specialties:  Neurology, Radiology Contact information: [address]  [place] [address] [phone]          Allergies  Allergen Reactions  . Cephalexin Swelling and Other (See Comments)    Tolerated Rocephin 10/19. Tongue swelling, but no breathing issues Tolerated Zosyn in Sept 2019 without any issues  . Statins Other (See Comments)    Weak muscles  . Ciprofloxacin Rash      Procedures/Studies: Dg Chest 2 View  Result Date: 10/03/2018 CLINICAL DATA:  Altered mental status EXAM: CHEST - 2 VIEW COMPARISON:  08/23/2018 FINDINGS: Mild cardiomegaly. No acute consolidation or effusion. No pneumothorax. Postsurgical changes in the cervical spine. IMPRESSION: No active cardiopulmonary disease.  Cardiomegaly. Electronically Signed   By: Donavan Foil  M.D.   On: 10/03/2018 23:59   Ct Head Wo Contrast  Result Date: 10/04/2018 CLINICAL DATA:  Altered mental status. EXAM: CT HEAD WITHOUT CONTRAST TECHNIQUE: Contiguous axial images were obtained from the base of the skull through the vertex without intravenous contrast. COMPARISON:  CT 08/19/2018 FINDINGS: Brain: Brain volume is normal for age. Mild chronic small vessel ischemia, unchanged from prior. No intracranial hemorrhage, mass effect, or midline shift. No hydrocephalus. The basilar cisterns are patent. No evidence of territorial infarct or acute ischemia. No extra-axial or intracranial fluid collection. Vascular: Atherosclerosis of skullbase vasculature without hyperdense vessel or abnormal calcification. Skull: No fracture or focal lesion. Sinuses/Orbits: No acute finding. Trace mucosal thickening in the sphenoid sinus, unchanged. Other: None. IMPRESSION: No acute intracranial abnormality. Electronically Signed   By: Keith Rake M.D.   On: 10/04/2018 02:39   Ct Angio Chest Pe W Or Wo Contrast  Result Date: 10/04/2018 CLINICAL DATA:  Hypoxia. Altered mental status. Dialysis patient. EXAM: CT ANGIOGRAPHY CHEST WITH CONTRAST TECHNIQUE: Multidetector CT imaging of the chest was performed using the standard protocol during bolus administration of intravenous contrast. Multiplanar CT image reconstructions and MIPs were obtained to evaluate the vascular anatomy. CONTRAST:  141mL ISOVUE-370 IOPAMIDOL (ISOVUE-370) INJECTION 76% COMPARISON:  Chest radiography 10/03/2018 FINDINGS: Cardiovascular: Pulmonary arterial opacification is good. There are no pulmonary emboli. The heart is enlarged. There is extensive coronary artery calcification. There is aortic atherosclerosis without aneurysm or dissection. Mediastinum/Nodes: No mass or lymphadenopathy. Lungs/Pleura: No pleural effusion. Mild dependent pulmonary atelectasis. No infiltrate or mass. Upper Abdomen: Air in the biliary tree as seen previously. No acute  upper abdominal finding. Musculoskeletal: Ordinary thoracic spondylosis, most pronounced at T11-12. Review of the MIP images confirms the above findings. IMPRESSION: 1. No pulmonary emboli or other acute chest pathology. 2. Air in the biliary tree as seen previously. Aortic Atherosclerosis (ICD10-I70.0). Electronically Signed   By: Nelson Chimes M.D.   On: 10/04/2018 07:29   Mr Brain Wo Contrast  Result Date: 10/04/2018 CLINICAL  DATA:  Initial evaluation for acute weakness, confusion, altered mental status. EXAM: MRI HEAD WITHOUT CONTRAST TECHNIQUE: Multiplanar, multiecho pulse sequences of the brain and surrounding structures were obtained without intravenous contrast. COMPARISON:  Prior CT from earlier the same day. FINDINGS: Brain: Mild diffuse prominence of the CSF containing spaces compatible with generalized age-related cerebral atrophy. Patchy and confluent T2/FLAIR hyperintensity within the periventricular and deep white matter both cerebral hemispheres most compatible with chronic microvascular ischemic change, moderate nature. Chronic microvascular ischemic disease noted within the pons as well. Subcentimeter remote right cerebellar infarct noted. 6 mm focus of diffusion abnormality within the right cerebellar hemisphere consistent with a small acute ischemic infarct (series 5, image 57). No associated hemorrhage or mass effect. No other evidence for acute or subacute ischemia. Gray-white matter differentiation otherwise maintained. No evidence for acute or chronic intracranial hemorrhage. No mass lesion, midline shift or mass effect. No hydrocephalus. No extra-axial fluid collection. Pituitary gland within normal limits. Vascular: Major intracranial vascular flow voids are maintained. Skull and upper cervical spine: Craniocervical junction within normal limits. Postsurgical changes noted within the upper cervical spine, partially visualized. No focal marrow replacing lesion. Scalp soft tissues  unremarkable. Sinuses/Orbits: Globes and orbital soft tissues within normal limits. Mild chronic mucosal thickening seen throughout the paranasal sinuses. No air-fluid levels to suggest acute sinusitis. Trace opacity bilateral mastoid air cells, of doubtful significance. Inner ear structures grossly normal. Other: None. IMPRESSION: 1. 6 mm acute ischemic nonhemorrhagic right cerebellar infarct. 2. Underlying age-related cerebral atrophy with moderate chronic small vessel ischemic disease, with additional subcentimeter chronic right cerebellar infarct. Electronically Signed   By: Jeannine Boga M.D.   On: 10/04/2018 06:22   Mr Jodene Nam Head Wo Contrast  Result Date: 10/04/2018 CLINICAL DATA:  Follow-up acute stroke. 6 mm right cerebellar infarction. EXAM: MRA HEAD WITHOUT CONTRAST TECHNIQUE: Angiographic images of the Circle of Willis were obtained using MRA technique without intravenous contrast. COMPARISON:  MRI same day. FINDINGS: Both internal carotid arteries are patent through the skull base and siphon regions. There is atherosclerotic irregularity in the carotid siphon regions but no flow limiting stenosis. The anterior and middle cerebral vessels are patent without proximal stenosis, aneurysm or vascular malformation. More distal branch vessels show some atherosclerotic irregularity. Both vertebral arteries are patent to the basilar. No basilar stenosis. Posterior circulation branch vessels are patent. Posterior cerebral artery in superior cerebellar artery branches distally show some atherosclerotic irregularity. IMPRESSION: No large or medium vessel occlusion or correctable proximal stenosis. Distal branch vessel atherosclerotic narrowing and irregularity seen throughout, more prominent within the superior cerebellar and posterior cerebral branches. Electronically Signed   By: Nelson Chimes M.D.   On: 10/04/2018 10:22   [place] Rt Upper Extrem Ltd Soft Tissue Non Vascular  Result Date: 10/05/2018 CLINICAL  DATA:  Recurrent bacteremia and history of end-stage renal disease with right upper arm AV fistula. EXAM: ULTRASOUND RIGHT UPPER EXTREMITY LIMITED TECHNIQUE: Ultrasound examination of the upper extremity soft tissues was performed in the area of clinical concern. COMPARISON:  08/20/2018 FINDINGS: Aneurysmal right upper arm AV fistula shows patency and flow. There is a mild amount wall thickening and mild mural thrombus in different portions of the fistula. A segment of a venous outflow stent is also identified near the axilla. No evidence of abnormal fluid collection or focal abscess surrounding the fistula. IMPRESSION: Patent right upper arm AV fistula with some areas of wall thickening and mild nonocclusive mural thrombus. Partial visualization of axillary venous outflow stent. No evidence of  abnormal fluid collection or abscess surrounding the fistula. Electronically Signed   By: Aletta Edouard M.D.   On: 10/05/2018 14:36   Ct Renal Stone Study  Result Date: 10/04/2018 CLINICAL DATA:  Right flank pain EXAM: CT ABDOMEN AND PELVIS WITHOUT CONTRAST TECHNIQUE: Multidetector CT imaging of the abdomen and pelvis was performed following the standard protocol without IV contrast. COMPARISON:  08/20/2018 FINDINGS: Lower chest: 4 mm right lower lobe pulmonary nodule on image 7 is stable dating back to 09/27/2011. Hepatobiliary: Postcholecystectomy and pneumobilia are stable. No obvious liver mass. Pancreas: Unremarkable Spleen: Unremarkable Adrenals/Urinary Tract: Adrenal glands are within normal limits. There is advanced atrophy of both kidneys. Several low-density lesions are scattered throughout both kidneys. They are not significantly changed. There is no hydronephrosis. Calcifications within the central kidneys are likely vascular. These are also stable. No definite ureteral calculus or hydroureter. Bladder is decompressed. Stomach/Bowel: Normal appendix. There is no obvious mass in the visualized colon. No  disproportionate dilatation of small bowel to suggest obstruction. Nonspecific air-fluid levels in small bowel are noted. Stomach is decompressed. Vascular/Lymphatic: Atherosclerotic calcifications of the aorta and iliac arteries are present. Maximal diameter of the abdominal aorta is 2.6 cm. Reproductive: There surgical clips in the prostate gland. Other: No free fluid. Musculoskeletal: Spinal fusion hardware is in place. There is solid fusion across the L4-5 disc space. Ray cage is are in place at the L3-4 disc space. There is some bony bridging across the posterior elements at this level. No vertebral compression. IMPRESSION: No evidence of urinary calculus or urinary obstruction. Stable exam. Electronically Signed   By: Marybelle Killings M.D.   On: 10/04/2018 08:57   Vas US Carotid (at [facility] Only)  Result Date: 10/05/2018 Carotid Arterial Duplex Study Indications: CVA. Performing Technologist: June Leap RDMS, RVT  Examination Guidelines: A complete evaluation includes B-mode imaging, spectral Doppler, color Doppler, and power Doppler as needed of all accessible portions of each vessel. Bilateral testing is considered an integral part of a complete examination. Limited examinations for reoccurring indications may be performed as noted.  Right Carotid Findings: +----------+--------+--------+--------+----------+--------+           PSV cm/sEDV cm/sStenosisDescribe  Comments +----------+--------+--------+--------+----------+--------+ CCA Prox  54      11              hypoechoic         +----------+--------+--------+--------+----------+--------+ CCA Distal60      11                                 +----------+--------+--------+--------+----------+--------+ ICA Prox  60      16      1-39%   calcific           +----------+--------+--------+--------+----------+--------+ ICA Distal130     36                                 +----------+--------+--------+--------+----------+--------+  ECA       126     5                                  +----------+--------+--------+--------+----------+--------+ +----------+--------+-------+----------------+-------------------+           PSV cm/sEDV cmsDescribe        Arm Pressure (mmHG) +----------+--------+-------+----------------+-------------------+ [id number]  Multiphasic, WNL                    +----------+--------+-------+----------------+-------------------+ +---------+--------+--+--------+--+---------+ VertebralPSV cm/s60EDV cm/s13Antegrade +---------+--------+--+--------+--+---------+  Left Carotid Findings: +----------+--------+--------+--------+------------+--------+           PSV cm/sEDV cm/sStenosisDescribe    Comments +----------+--------+--------+--------+------------+--------+ CCA Prox  90      20              heterogenous         +----------+--------+--------+--------+------------+--------+ CCA Distal64      12                                   +----------+--------+--------+--------+------------+--------+ ICA Prox  55      17      1-39%   heterogenous         +----------+--------+--------+--------+------------+--------+ ICA Distal114     27                                   +----------+--------+--------+--------+------------+--------+ ECA       82      5                                    +----------+--------+--------+--------+------------+--------+ +----------+--------+--------+----------------+-------------------+ SubclavianPSV cm/sEDV cm/sDescribe        Arm Pressure (mmHG) +----------+--------+--------+----------------+-------------------+           120             Multiphasic, WNL                    +----------+--------+--------+----------------+-------------------+ +---------+--------+--+--------+--+---------+ VertebralPSV cm/s56EDV cm/s19Antegrade +---------+--------+--+--------+--+---------+  Summary: Right Carotid: Velocities in the right  ICA are consistent with a 1-39% stenosis. Left Carotid: Velocities in the left ICA are consistent with a 1-39% stenosis. Vertebrals: Bilateral vertebral arteries demonstrate antegrade flow. *See table(s) above for measurements and observations.  Electronically signed by Antony Contras MD on 10/05/2018 at 2:10:42 PM.    Final    US Abdomen Limited Ruq  Result Date: 10/04/2018 CLINICAL DATA:  History of hepatitis. EXAM: ULTRASOUND ABDOMEN LIMITED RIGHT UPPER QUADRANT COMPARISON:  CT abdomen and pelvis earlier today. FINDINGS: Gallbladder: Removed. Common bile duct: Diameter: 0.5 cm Liver: Pneumobilia and mild intrahepatic biliary ductal prominence in the left hepatic lobe as seen on CT earlier today noted. No focal lesion. Portal vein is patent on color Doppler imaging with normal direction of blood flow towards the liver. IMPRESSION: No acute or focal abnormality. Status post cholecystectomy. Pneumobilia and mild intrahepatic biliary ductal prominence in the left hepatic lobe as seen on CT earlier today. Electronically Signed   By: Inge Rise M.D.   On: 10/04/2018 16:15      Subjective: Patient seen on dialysis this morning.  Denies complaints.  No pain, chest pain, dyspnea, dizziness or lightheadedness.  Tolerated Keflex last night without any side effects.  Discharge Exam:  Vitals:   10/09/18 1030 10/09/18 1100 10/09/18 1107 10/09/18 1229  BP: 115/80 101/70 124/89 127/87  Pulse: 67 73 (!) 59 78  Resp:   18 18  Temp:   97.8 F (36.6 C) (!) 97.4 F (36.3 C)  TempSrc:   Oral Oral  SpO2:   98% 97%  Weight:      Height:        General  exam: Pleasant middle-age male, moderately built and nourished lying comfortably supine in bed undergoing HD. Respiratory system: Clear to auscultation.  No increased work of breathing. Cardiovascular system: S1 and S2 heard, RRR.  No JVD, murmurs or pedal edema.  Telemetry personally reviewed: Sinus rhythm, low voltage but no arrhythmias. Abdominal exam,  nondistended, soft and nontender.  Normal bowel sounds heard. CNS: Alert and oriented x3.  No focal neurological deficits.    The results of significant diagnostics from this hospitalization (including imaging, microbiology, ancillary and laboratory) are listed below for reference.     Microbiology: Recent Results (from the past 240 hour(s))  Culture, blood (routine x 2)     Status: Abnormal   Collection Time: 10/04/18 12:01 AM  Result Value Ref Range Status   Specimen Description BLOOD LEFT ANTECUBITAL  Final   Special Requests   Final    BOTTLES DRAWN AEROBIC AND ANAEROBIC Blood Culture results may not be optimal due to an inadequate volume of blood received in culture bottles   Culture  Setup Time   Final    GRAM NEGATIVE RODS IN BOTH AEROBIC AND ANAEROBIC BOTTLES CRITICAL RESULT CALLED TO, READ BACK BY AND VERIFIED WITH: Earnest Rosier [phone] FCP Performed at [facility], [place] [address] [place], [place] [address]    Culture (A)  Final    KLEBSIELLA PNEUMONIAE SUSCEPTIBILITIES PERFORMED ON PREVIOUS CULTURE WITHIN THE LAST 5 DAYS.    Report Status 10/07/2018 FINAL  Final  Blood Culture ID Panel (Reflexed)     Status: Abnormal   Collection Time: 10/04/18 12:01 AM  Result Value Ref Range Status   Enterococcus species NOT DETECTED NOT DETECTED Final   Listeria monocytogenes NOT DETECTED NOT DETECTED Final   Staphylococcus species NOT DETECTED NOT DETECTED Final   Staphylococcus aureus (BCID) NOT DETECTED NOT DETECTED Final   Streptococcus species NOT DETECTED NOT DETECTED Final   Streptococcus agalactiae NOT DETECTED NOT DETECTED Final   Streptococcus pneumoniae NOT DETECTED NOT DETECTED Final   Streptococcus pyogenes NOT DETECTED NOT DETECTED Final   Acinetobacter baumannii NOT DETECTED NOT DETECTED Final   Enterobacteriaceae species DETECTED (A) NOT DETECTED Final    Comment: Enterobacteriaceae represent a large family of gram-negative bacteria, not a single  organism. CRITICAL RESULT CALLED TO, READ BACK BY AND VERIFIED WITH: Amasa [phone] FCP    Enterobacter cloacae complex NOT DETECTED NOT DETECTED Final   Escherichia coli NOT DETECTED NOT DETECTED Final   Klebsiella oxytoca NOT DETECTED NOT DETECTED Final   Klebsiella pneumoniae DETECTED (A) NOT DETECTED Final    Comment: CRITICAL RESULT CALLED TO, READ BACK BY AND VERIFIED WITH: PHARMD ANDREW MEYER [phone] FCP    Proteus species NOT DETECTED NOT DETECTED Final   Serratia marcescens NOT DETECTED NOT DETECTED Final   Carbapenem resistance NOT DETECTED NOT DETECTED Final   Haemophilus influenzae NOT DETECTED NOT DETECTED Final   Neisseria meningitidis NOT DETECTED NOT DETECTED Final   Pseudomonas aeruginosa NOT DETECTED NOT DETECTED Final   Candida albicans NOT DETECTED NOT DETECTED Final   Candida glabrata NOT DETECTED NOT DETECTED Final   Candida krusei NOT DETECTED NOT DETECTED Final   Candida parapsilosis NOT DETECTED NOT DETECTED Final   Candida tropicalis NOT DETECTED NOT DETECTED Final  Culture, blood (routine x 2)     Status: Abnormal   Collection Time: 10/04/18 12:07 AM  Result Value Ref Range Status   Specimen Description BLOOD LEFT FOREARM  Final   Special Requests  Final    BOTTLES DRAWN AEROBIC AND ANAEROBIC Blood Culture results may not be optimal due to an inadequate volume of blood received in culture bottles Performed at [place] [address] [place], [place] [address]    Culture  Setup Time   Final    GRAM NEGATIVE RODS IN BOTH AEROBIC AND ANAEROBIC BOTTLES CRITICAL VALUE NOTED.  VALUE IS CONSISTENT WITH PREVIOUSLY REPORTED AND CALLED VALUE.    Culture KLEBSIELLA PNEUMONIAE (A)  Final   Report Status 10/07/2018 FINAL  Final   Organism ID, Bacteria KLEBSIELLA PNEUMONIAE  Final      Susceptibility   Klebsiella pneumoniae - MIC*    AMPICILLIN >=32 RESISTANT Resistant     CEFAZOLIN <=4 SENSITIVE Sensitive     CEFEPIME <=1  SENSITIVE Sensitive     CEFTAZIDIME <=1 SENSITIVE Sensitive     CEFTRIAXONE <=1 SENSITIVE Sensitive     CIPROFLOXACIN <=0.25 SENSITIVE Sensitive     GENTAMICIN <=1 SENSITIVE Sensitive     IMIPENEM <=0.25 SENSITIVE Sensitive     TRIMETH/SULFA <=20 SENSITIVE Sensitive     AMPICILLIN/SULBACTAM 8 SENSITIVE Sensitive     PIP/TAZO 16 SENSITIVE Sensitive     Extended ESBL NEGATIVE Sensitive     * KLEBSIELLA PNEUMONIAE  MRSA PCR Screening     Status: None   Collection Time: 10/04/18  6:06 PM  Result Value Ref Range Status   MRSA by PCR NEGATIVE NEGATIVE Final    Comment:        The GeneXpert MRSA Assay (FDA approved for NASAL specimens only), is one component of a comprehensive MRSA colonization surveillance program. It is not intended to diagnose MRSA infection nor to guide or monitor treatment for MRSA infections. Performed at [facility], [place] [address] [place], [place] [address]   Culture, blood (Routine X 2) w Reflex to ID Panel     Status: None (Preliminary result)   Collection Time: 10/06/18  5:35 AM  Result Value Ref Range Status   Specimen Description BLOOD LEFT HAND  Final   Special Requests   Final    BOTTLES DRAWN AEROBIC ONLY Blood Culture adequate volume   Culture   Final    NO GROWTH 3 DAYS Performed at [facility], [place] [address], [place], [place] [address]    Report Status PENDING  Incomplete     Labs: CBC: Recent Labs  Lab 10/03/18 2342  10/06/18 0533 10/07/18 0450 10/07/18 1029 10/08/18 0816 10/09/18 0620  WBC 11.8*   < > 8.8 7.4 7.6 7.2 8.5  NEUTROABS 9.7*  --   --   --   --   --   --   HGB 12.4*   < > 12.3* 12.0* 11.6* 12.8* 12.0*  HCT 41.5   < > 39.5 38.3* 38.6* 40.8 39.1  MCV 95.6   < > 93.6 93.4 93.5 94.2 93.1  PLT 263   < > 284 280 279 278 286   < > = values in this interval not displayed.   Basic Metabolic Panel: Recent Labs  Lab 10/03/18 2342  10/05/18 0454 10/06/18 0533 10/07/18 0450 10/08/18 0816 10/09/18 0620  NA   --    < > 136 136 139 137 136  K  --    < > 5.8* 4.9 5.4* 4.8 4.5  CL  --    < > 94* 96* 97* 100 99  CO2  --    < > 26 25 24 22  21*  GLUCOSE  --    < >  146* 149* 144* 147* 137*  BUN  --    < > 28* 50* 73* 50* 70*  CREATININE  --    < > 6.80* 9.28* 11.48* 9.36* 11.15*  CALCIUM  --    < > 9.0 9.1 8.5* 8.7* 8.3*  MG 2.0  --   --   --   --   --   --   PHOS  --   --   --  5.1*  --   --  6.5*   < > = values in this interval not displayed.   Liver Function Tests: Recent Labs  Lab 10/03/18 2343 10/05/18 0454 10/06/18 0533 10/09/18 0345 10/09/18 0620  AST 471* 156* 50* 17  --   ALT 439* 272* 165* 59*  --   ALKPHOS 235* 222* 193* 132*  --   BILITOT 2.1* 1.6* 0.7 0.7  --   PROT 7.4 7.5 7.8 6.9  --   ALBUMIN 3.1* 3.0* 3.1* 2.9* 2.9*   Cardiac Enzymes: Recent Labs  Lab 10/03/18 2342 10/04/18 0520 10/04/18 1105 10/04/18 1650  CKTOTAL 347  --   --   --   TROPONINI  --  0.04* 0.04* 0.04*   CBG: Recent Labs  Lab 10/08/18 0736 10/08/18 1145 10/08/18 1622 10/08/18 2140 10/09/18 1238  GLUCAP 167* 141* 185* 255* 116*      Time coordinating discharge: 60 minutes  SIGNED:  Vernell Leep, MD, FACP, [facility]. [facility] Pager [phone]  If 7PM-7AM, please contact night-coverage [email or web address] Password [email or web address] 10/09/2018, 2:01 PM

## 2018-10-10 ENCOUNTER — Other Ambulatory Visit: Payer: Self-pay | Admitting: *Deleted

## 2018-10-10 ENCOUNTER — Telehealth: Payer: Self-pay | Admitting: *Deleted

## 2018-10-10 ENCOUNTER — Telehealth: Payer: Self-pay | Admitting: Internal Medicine

## 2018-10-10 DIAGNOSIS — M11832 Other specified crystal arthropathies, left wrist: Secondary | ICD-10-CM | POA: Diagnosis not present

## 2018-10-10 DIAGNOSIS — C61 Malignant neoplasm of prostate: Secondary | ICD-10-CM | POA: Diagnosis not present

## 2018-10-10 LAB — HEPATITIS B CORE ANTIBODY, TOTAL: Hep B Core Total Ab: NEGATIVE

## 2018-10-10 LAB — HEPATITIS B SURFACE ANTIBODY,QUALITATIVE: Hep B S Ab: NONREACTIVE

## 2018-10-10 NOTE — Patient Outreach (Signed)
[facility]) Care Management  10/10/2018  [name] Dec 11, 1950 [id number]   Notified that member discharged from hospital on 1/9 after being admitted from bacteremia due to klebsiella pneumoniae.  Primary MD will complete transition of care assessment.  Call placed to member/wife to follow up on discharge.  Spoke to wife, [name], state member remains weak and in need of increased care but is improving.  He had appointment with orthopedic specialist this morning, has appointment with urologist this afternoon.  Appointments next week are Rheumatology on 1/13, ID on 1/15, primary MD on 1/17.  He also has dialysis on Tuesday/Thursday/Saturday.  Report she need to call to schedule his cardiology appointment.    Medications reviewed, changes discussed, wife report compliance.  [facility] program was recommended prior to discharge, member refused to switch home health agency.  Will continue with [facility], but wife has not heard from them since discharge.  She will call them today to have services restarted.  Discussed wife's involvement with member's daily medical appointments as well as managing her adult son's care and her own care.  She does report that it gets overwhelming at times, denies having minimal support from friends/family.  Member adamant about wife performing all care for him.  Wife state she will keep in mind that there are resources in the community to provide relief and will contact this care manager or [facility] social worker if she decide to look further into it.    Denies any urgent concerns at this time, advised to contact this care manager with questions.  Will follow up within the next 2 weeks.  [facility] CM Care Plan Problem One     Most Recent Value  Care Plan Problem One  Patient at risk for hospitalization related to sepsis as evidenced by recent hospitalization  Role Documenting the Problem One  Care Management [place] for Problem One   Active  [facility] [place] Long Term Goal   Patient will not have a hospital admission within the next 31 days  [facility] Long Term Goal Start Date  10/10/18  [facility] Long Term Goal Met Date  -- [initial goal not met, member readmitted, date reset]  Interventions for Problem One Long Term Goal  Discharge instructions reviewed with wife.  Educated on signs/symptoms of infection. Medications reviewd, educated on use of antibiotics.  [facility] CM Short Term Goal #1   Member will keep and attend all follow up appointments within the next 2 weeks  [facility] CM Short Term Goal #1 Start Date  10/10/18  Interventions for Short Term Goal #1  All upcoming appointments reviewed with member, assessed the need for transportation.  [facility] CM Short Term Goal #2   Member will report home health restarted within the next week  [facility] CM Short Term Goal #2 Start Date  10/10/18  Interventions for Short Term Goal #2  Advised wife of importance of calling [facility] as soon as possible to have services restarted in effort to provide member with optimal care  [facility] CM Short Term Goal #3  Member will free from infection over the next 30 days  [facility] CM Short Term Goal #3 Start Date  10/10/18  [facility] CM Short Term Goal #3 Met Date  -- [Not met, date reset]  Interventions for Short Tern Goal #3  Educated on signs/symptoms of infection and when to contact MD.  [facility] CM Short Term Goal #4  Member will not report falls at home for next 30 days  [facility] CM Short Term Goal #4 Start Date  09/17/18  [facility] CM Short Term Goal #4 Met Date  10/10/18     [name], RN, MSN [place] [phone]

## 2018-10-10 NOTE — Telephone Encounter (Signed)
Noted  

## 2018-10-10 NOTE — Telephone Encounter (Signed)
Copied from [place] [id number]. Topic: General - Other >> Oct 10, 2018 10:03 AM Carolyn Stare wrote:  Darlina Guys with [facility] cal lto see they will be going out to see the pt on 10/12/2018   [phone]

## 2018-10-10 NOTE — Telephone Encounter (Signed)
Trid calling pt/wife to confirm hosp f/u appt that was made for 10/17/18 by [facility]. There was no answer LMOM RTC...[name]

## 2018-10-11 DIAGNOSIS — D509 Iron deficiency anemia, unspecified: Secondary | ICD-10-CM | POA: Diagnosis not present

## 2018-10-11 DIAGNOSIS — E119 Type 2 diabetes mellitus without complications: Secondary | ICD-10-CM | POA: Diagnosis not present

## 2018-10-11 DIAGNOSIS — N186 End stage renal disease: Secondary | ICD-10-CM | POA: Diagnosis not present

## 2018-10-11 DIAGNOSIS — D631 Anemia in chronic kidney disease: Secondary | ICD-10-CM | POA: Diagnosis not present

## 2018-10-11 DIAGNOSIS — N2581 Secondary hyperparathyroidism of renal origin: Secondary | ICD-10-CM | POA: Diagnosis not present

## 2018-10-11 LAB — CULTURE, BLOOD (ROUTINE X 2)
Culture: NO GROWTH
Special Requests: ADEQUATE

## 2018-10-13 DIAGNOSIS — R5383 Other fatigue: Secondary | ICD-10-CM | POA: Diagnosis not present

## 2018-10-13 DIAGNOSIS — I132 Hypertensive heart and chronic kidney disease with heart failure and with stage 5 chronic kidney disease, or end stage renal disease: Secondary | ICD-10-CM | POA: Diagnosis not present

## 2018-10-13 DIAGNOSIS — E1122 Type 2 diabetes mellitus with diabetic chronic kidney disease: Secondary | ICD-10-CM | POA: Diagnosis not present

## 2018-10-13 DIAGNOSIS — Z992 Dependence on renal dialysis: Secondary | ICD-10-CM | POA: Diagnosis not present

## 2018-10-13 DIAGNOSIS — D631 Anemia in chronic kidney disease: Secondary | ICD-10-CM | POA: Diagnosis not present

## 2018-10-13 DIAGNOSIS — M25532 Pain in left wrist: Secondary | ICD-10-CM | POA: Diagnosis not present

## 2018-10-13 DIAGNOSIS — I5082 Biventricular heart failure: Secondary | ICD-10-CM | POA: Diagnosis not present

## 2018-10-13 DIAGNOSIS — Z6828 Body mass index (BMI) 28.0-28.9, adult: Secondary | ICD-10-CM | POA: Diagnosis not present

## 2018-10-13 DIAGNOSIS — N186 End stage renal disease: Secondary | ICD-10-CM | POA: Diagnosis not present

## 2018-10-13 DIAGNOSIS — R7881 Bacteremia: Secondary | ICD-10-CM | POA: Diagnosis not present

## 2018-10-13 DIAGNOSIS — I5042 Chronic combined systolic (congestive) and diastolic (congestive) heart failure: Secondary | ICD-10-CM | POA: Diagnosis not present

## 2018-10-13 DIAGNOSIS — E663 Overweight: Secondary | ICD-10-CM | POA: Diagnosis not present

## 2018-10-13 DIAGNOSIS — I2510 Atherosclerotic heart disease of native coronary artery without angina pectoris: Secondary | ICD-10-CM | POA: Diagnosis not present

## 2018-10-13 DIAGNOSIS — M25432 Effusion, left wrist: Secondary | ICD-10-CM | POA: Diagnosis not present

## 2018-10-13 NOTE — Telephone Encounter (Signed)
Transition Care Management Follow-up Telephone Call   Date discharged? 10/09/18   How have you been since you were released from the hospital? Spoke w/wife she states pt his doing ok   Do you understand why you were in the hospital? YES   Do you understand the discharge instructions? YES   Where were you discharged to? Home   Items Reviewed:  Medications reviewed: YES  Allergies reviewed: YES  Dietary changes reviewed: YES. Heart healthy & diabetic diet  Referrals reviewed: YES   Functional Questionnaire:   Activities of Daily Living (ADLs):   She states he are independent in the following: feeding, continence, grooming, toileting and dressing States he require assistance with the following: ambulation and bathing and hygiene   Any transportation issues/concerns?: NO   Any patient concerns?NO   Confirmed importance and date/time of follow-up visits scheduled YES, appt 10/17/18  Provider Appointment booked with Dr. [name]  Confirmed with patient if condition begins to worsen call PCP or go to the ER.  Patient was given the office number and encouraged to call back with question or concerns.  : YES

## 2018-10-14 DIAGNOSIS — E119 Type 2 diabetes mellitus without complications: Secondary | ICD-10-CM | POA: Diagnosis not present

## 2018-10-14 DIAGNOSIS — D509 Iron deficiency anemia, unspecified: Secondary | ICD-10-CM | POA: Diagnosis not present

## 2018-10-14 DIAGNOSIS — D631 Anemia in chronic kidney disease: Secondary | ICD-10-CM | POA: Diagnosis not present

## 2018-10-14 DIAGNOSIS — N2581 Secondary hyperparathyroidism of renal origin: Secondary | ICD-10-CM | POA: Diagnosis not present

## 2018-10-14 DIAGNOSIS — N186 End stage renal disease: Secondary | ICD-10-CM | POA: Diagnosis not present

## 2018-10-15 ENCOUNTER — Encounter: Payer: Self-pay | Admitting: Infectious Diseases

## 2018-10-15 ENCOUNTER — Telehealth: Payer: Self-pay | Admitting: Internal Medicine

## 2018-10-15 ENCOUNTER — Ambulatory Visit (INDEPENDENT_AMBULATORY_CARE_PROVIDER_SITE_OTHER): Payer: Medicare Other | Admitting: Infectious Diseases

## 2018-10-15 ENCOUNTER — Inpatient Hospital Stay: Payer: Medicare Other | Admitting: Internal Medicine

## 2018-10-15 VITALS — BP 114/66 | HR 85 | Temp 98.5°F | Wt 215.0 lb

## 2018-10-15 DIAGNOSIS — B961 Klebsiella pneumoniae [K. pneumoniae] as the cause of diseases classified elsewhere: Secondary | ICD-10-CM

## 2018-10-15 DIAGNOSIS — A0472 Enterocolitis due to Clostridium difficile, not specified as recurrent: Secondary | ICD-10-CM | POA: Diagnosis not present

## 2018-10-15 DIAGNOSIS — M25532 Pain in left wrist: Secondary | ICD-10-CM | POA: Diagnosis not present

## 2018-10-15 DIAGNOSIS — R7881 Bacteremia: Secondary | ICD-10-CM

## 2018-10-15 MED ORDER — VANCOMYCIN HCL 125 MG PO CAPS: 125.0000 mg | ORAL_CAPSULE | Freq: Four times a day (QID) | ORAL | 0 refills | Status: DC

## 2018-10-15 MED ORDER — VANCOMYCIN HCL 125 MG PO CAPS: 125.0000 mg | ORAL_CAPSULE | Freq: Two times a day (BID) | ORAL | 0 refills | Status: DC

## 2018-10-15 NOTE — Patient Instructions (Signed)
For your Vancomycin - Please continue taking one pill twice a day with your antibiotics and continue for another week after.   If your bowel movements start to get more watery or more frequent please increase this to four times day  Continue and finish the Keflex   Return in 3 weeks to see Dr. Baxter Flattery or Dr. Megan Salon.

## 2018-10-15 NOTE — Progress Notes (Signed)
Patient: [name]  DOB: 08-14-1951 MRN: [id number] PCP: Biagio Borg, MD    Patient Active Problem List   Diagnosis Date Noted  . Cerebral embolism with cerebral infarction 10/05/2018  . Acute hepatitis   . Left wrist pain 09/03/2018  . C. difficile colitis 09/03/2018  . Altered mental status   . Bacteremia due to Klebsiella pneumoniae   . Acute gouty arthritis 08/08/2018  . Sepsis ([place]) 07/16/2018  . Thrush 06/21/2018  . GI bleed 06/11/2018  . Recurrent falls 05/14/2018  . Chronic combined systolic (congestive) and diastolic (congestive) heart failure ([place]) 01/15/2018  . Abscess of left leg 10/14/2017  . Insomnia 10/14/2017  . Lumbar stenosis with neurogenic claudication 07/31/2017  . Lightheadedness   . Rectal bleeding   . Gait disorder 05/05/2017  . Chronic GI bleeding 04/22/2017  . Malaise and fatigue 12/27/2016  . Generalized weakness 12/27/2016  . History of PSVT (paroxysmal supraventricular tachycardia) 10/16/2016  . Abdominal pain 08/27/2016  . Abnormal findings on diagnostic imaging of liver and biliary tract 08/27/2016  . Bloating symptom 08/27/2016  . Hematochezia 08/27/2016  . Esophageal ulcer 08/27/2016  . Flatulence, eructation and gas pain 08/27/2016  . General weakness 08/27/2016  . Skin sensation disturbance 08/27/2016  . Stricture of esophagus 08/27/2016  . Status post coronary artery stent placement   . ST segment depression 08/05/2016  . PAD (peripheral artery disease) ([place]) 06/18/2016  . Cervical radiculopathy, chronic 02/23/2016  . Memory loss 12/22/2015  . Left-sided weakness 12/22/2015  . Peripheral polyneuropathy 12/22/2015  . DM (diabetes mellitus) type II controlled with renal manifestation ([place]) 10/31/2015  . Nausea and vomiting 10/31/2015  . Malignant neoplasm of prostate ([place]) 10/24/2015  . S/P right total knee replacement 08/09/2015  . Primary osteoarthritis of right knee 08/05/2015  . Neuropathy due to secondary diabetes  mellitus ([place]) 08/05/2015  . Hyperthyroidism 03/11/2015  . Nodule of right lung 09/14/2014  . PSA elevation 08/30/2014  . ESRD on hemodialysis ([place]) 08/16/2014  . Essential hypertension 08/16/2014  . Encounter for hemodialysis ([place]) 08/16/2014  . Allergic rhinitis 02/24/2014  . Vertigo 10/14/2013  . Cough 06/26/2013  . Numbness 05/15/2013  . Other complications due to renal dialysis device, implant, and graft 01/14/2013  . Right hand pain 03/28/2012  . Dysphagia 03/28/2012  . Hypogonadism, male 03/28/2012  . OSA (obstructive sleep apnea) 10/16/2011  . HLD (hyperlipidemia) 10/16/2011  . CAD S/P percutaneous coronary angioplasty 09/28/2011  . NSTEMI (non-ST elevated myocardial infarction) ([place]) 09/28/2011  . AVM (arteriovenous malformation) 09/27/2011  . Anemia associated with acute blood loss 09/26/2011  . Ischemic cardiomyopathy 06/16/2011  . Hyperparathyroidism, secondary ([place]) 06/16/2011  . Preventative health care 03/11/2011  . NECK PAIN 07/31/2010  . GYNECOMASTIA 07/17/2010  . DIZZINESS 07/17/2010  . GAIT DISTURBANCE 03/03/2010  . Thyrotoxicosis 02/02/2010  . Elevated levels of transaminase & lactic acid dehydrogenase 02/01/2010  . Other malaise and fatigue 11/24/2009  . Depression 10/14/2009  . BENIGN PROSTATIC HYPERTROPHY 10/14/2009  . COLONIC POLYPS, HX OF 10/14/2009  . Dementia ([place]) 09/02/2009  . PULMONARY NODULE, RIGHT LOWER LOBE 06/08/2009  . DYSPNEA 10/29/2008  . ONYCHOMYCOSIS, TOENAILS 12/26/2007  . GOITER, MULTINODULAR 12/26/2007  . Gout 06/18/2007  . Neuropathy ([place]) 06/18/2007  . Hypertensive heart disease with CHF (congestive heart failure) ([place]) 06/18/2007  . Gastro-esophageal reflux disease without esophagitis 06/18/2007     Subjective:  [name] is a 68 y.o.    Hospitalized again in interval with AMS and hypoxia and small  ischemic infarct on MRI of brain. Blood cultures positive for klebsiella again in both sets. He received IV therapy for 4  days and transitioned to PO therapy for blood stream infection without identified source. Abdominal ultrasound revealed pneumobilia and mild intrahepatic biliary ductal prominence in the left hepatic lobe as seen on CT earlier. Chest CT without infiltrate. L4-5 disc space with solid fusion and no concern for infection on CT scan, bony bridging across posterior elements of L3-4 space where ray cage is in place w/o evidence of compression or infection. Previously with this bacteremia in October and November 2019 - extensive work ups have revealed no source: TEE negative, R AVF U/S negative, L wrist aspiration negative in December (concerned for seeded joint).   He feels just weak and in general wiped out still. He reports improvement in his diarrhea symptoms - actually ran out of his vancomycin 2 days ago and not clear if they need to continue this for him. He describes 2-3 mushy/loose BMs a day. He is taking cephalexin BID currently with no concern for intolerance/side effects. Left wrist pain still there - he saw rheumatology for this and per his wife's report they are planning MRI for further evaluation of the joint. His wife and he are very nervous to stop antibiotic therapy and cannot understand where this keeps coming from.   He has no new complaints today outside what was discussed above.    Review of Systems  All other systems reviewed and are negative.   Past Medical History:  Diagnosis Date  . Acute encephalopathy 10/04/2018  . Allergic rhinitis, cause unspecified 02/24/2014  . Anemia 06/16/2011  . BENIGN PROSTATIC HYPERTROPHY 10/14/2009  . CAD, NATIVE VESSEL 02/06/2009   a. 06/2007 s/p Taxus DES to the RCA;  b. 08/2016 NSTEMI in setting of SVT/PCI: LM 30ost, LAD 39m (3.0x16 Synergy DES), LCX 82m, OM1 60, RI 40, RCA 70p/m, 57m - not amenable to PCI.  [name] Cervical radiculopathy, chronic 02/23/2016   Right c5-6 by NCS/EMG  . Chest pain 08/09/2015  . Chronic combined systolic (congestive) and  diastolic (congestive) heart failure ([place])    a. 10/2016 Echo: EF 40-45%, Gr2 DD. mildly dil LA.  [name] COLONIC POLYPS, HX OF 10/14/2009  . Dementia ([place]) [id number]  . Depression 09/24/2015  . DIABETES MELLITUS, TYPE II 02/01/2010  . DYSLIPIDEMIA 06/18/2007  . ESRD (end stage renal disease) on dialysis ([place]) 08/04/2010   ESRD due to DM adn HTN, started HD in 2011. As of 2019 has a R arm graft and gets HD on TTS sched  . FOOT PAIN 08/12/2008  . GAIT DISTURBANCE 03/03/2010  . GASTROENTERITIS, VIRAL 10/14/2009  . GERD 06/18/2007  . GOITER, MULTINODULAR 12/26/2007  . GOUT 06/18/2007  . GYNECOMASTIA 07/17/2010  . Hemodialysis access, fistula mature [facility])    Dialysis [facility] ([place]) Right upper arm fistula  . Hyperlipidemia 10/16/2011  . Hyperparathyroidism, secondary ([place]) 06/16/2011  . HYPERTENSION 06/18/2007  . Hyperthyroidism   . Hypocalcemia 06/07/2010  . Ischemic cardiomyopathy    a. 10/2016 Echo: EF 40-45%.  . Lumbar stenosis with neurogenic claudication   . ONYCHOMYCOSIS, TOENAILS 12/26/2007  . OSA on CPAP 10/16/2011  . Other malaise and fatigue 11/24/2009  . PERIPHERAL NEUROPATHY 06/18/2007  . Prostate cancer ([place])   . PSVT (paroxysmal supraventricular tachycardia) ([place])    a. 82/5053 complicated by NSTEMI;  b. 11/2016 Treated w/ adenosine in ED;  c. 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  [name] PULMONARY NODULE, RIGHT LOWER LOBE 06/08/2009  .  Sleep apnea    cpap machine and o2  . TRANSAMINASES, SERUM, ELEVATED 02/01/2010  . Transfusion history    none recent  . Unspecified hypotension 01/30/2010    Outpatient Medications Prior to Visit  Medication Sig Dispense Refill  . acetaminophen (TYLENOL) 325 MG tablet Take 650 mg by mouth every 6 (six) hours as needed for mild pain or headache.     . Alirocumab (PRALUENT) 150 MG/ML SOPN Inject 150 mg into the skin every 14 (fourteen) days. 2 pen 12  . allopurinol (ZYLOPRIM) 100 MG tablet TAKE ONE TABLET BY MOUTH ONCE DAILY (Patient taking differently:  Take 100 mg by mouth daily. ) 90 tablet 3  . aspirin EC 325 MG tablet Take 1 tablet (325 mg total) by mouth daily. 30 tablet 0  . betamethasone dipropionate (DIPROLENE) 0.05 % cream Apply 1 application topically daily as needed (leg sores).    . citalopram (CELEXA) 10 MG tablet Take 1 tablet (10 mg total) by mouth at bedtime. 90 tablet 3  . colchicine 0.6 MG tablet Take 1 tablet (0.6 mg total) by mouth daily. 90 tablet 3  . doxercalciferol ([name]) 4 MCG/2ML injection Inject 1 mL (2 mcg total) into the vein Every Tuesday,Thursday,and Saturday with dialysis. 2 mL 0  . ezetimibe (ZETIA) 10 MG tablet TAKE ONE TABLET BY MOUTH ONCE DAILY (Patient taking differently: Take 10 mg by mouth daily. ) 90 tablet 3  . ferric citrate (AURYXIA) 1 GM 210 MG(Fe) tablet Take 2 tablets (420 mg total) by mouth 3 (three) times daily with meals. 60 tablet 0  . gabapentin (NEURONTIN) 100 MG capsule Take 1 capsule (100 mg total) by mouth at bedtime. 30 capsule 0  . hydroxypropyl methylcellulose / hypromellose (ISOPTO TEARS / GONIOVISC) 2.5 % ophthalmic solution Place 1 drop into both eyes 3 (three) times daily as needed for dry eyes. 15 mL 11  . methimazole (TAPAZOLE) 5 MG tablet Take 0.5 tablets (2.5 mg total) by mouth daily. 45 tablet 3  . multivitamin (RENA-VIT) TABS tablet Take 1 tablet by mouth daily. Reported on 10/24/2015    . Nutritional Supplements (FEEDING SUPPLEMENT, NEPRO CARB STEADY,) LIQD Take 237 mLs by mouth 2 (two) times daily between meals. 7 Can 0  . polyethylene glycol (MIRALAX / GLYCOLAX) packet Take 17 g by mouth daily. 14 each 0  . predniSONE (DELTASONE) 10 MG tablet Take 1 tablet (10 mg total) by mouth daily with breakfast for 5 days. 5 tablet 0  . SENSIPAR 90 MG tablet Take 90 mg by mouth 2 (two) times daily.    Earnestine Mealing 625 MG tablet TAKE THREE TABLETS BY MOUTH TWICE DAILY WITH MEALS (Patient taking differently: Take 1,875 mg by mouth 2 (two) times daily with a meal. ) 180 tablet 11  . zolpidem  (AMBIEN) 5 MG tablet Take 1 tablet (5 mg total) by mouth at bedtime as needed. for sleep (Patient taking differently: Take 5 mg by mouth at bedtime as needed for sleep. ) 90 tablet 1  . cephALEXin (KEFLEX) 500 MG capsule Take 1 capsule (500 mg total) by mouth 2 (two) times daily for 13 days. 26 capsule 0  . vancomycin (VANCOCIN) 125 MG capsule Take 125 mg by mouth every other day. Starting 09/19/18 take 1 tablet every other day for 8 days. Started 10/07/2018 take 1 tablet every three days for 2 weeks.     No facility-administered medications prior to visit.      Allergies  Allergen Reactions  . Statins Other (See  Comments)    Weak muscles  . Ciprofloxacin Rash    Social History   Tobacco Use  . Smoking status: Former Smoker    Packs/day: 1.00    Years: 25.00    Pack years: 25.00    Types: Cigarettes    Last attempt to quit: 10/01/2005    Years since quitting: 13.0  . Smokeless tobacco: Former Systems developer    Quit date: 10/01/2005  . Tobacco comment: Quit smoking 2007 Smoked x 25 years 1/2 ppd.  Substance Use Topics  . Alcohol use: No    Alcohol/week: 0.0 standard drinks  . Drug use: No     Objective:   Vitals:   10/15/18 1500  BP: 114/66  Pulse: 85  Temp: 98.5 F (36.9 C)  TempSrc: Oral  Weight: 215 lb (97.5 kg)   Body mass index is 28.37 kg/m.  Physical Exam Vitals signs and nursing note reviewed.  Constitutional:      General: He is not in acute distress.    Appearance: Normal appearance. He is not toxic-appearing.     Comments: Seated in chair with his wife and rolling walker today. Appears fatigued. Frustrated.   HENT:     Mouth/Throat:     Mouth: Mucous membranes are moist.     Pharynx: No oropharyngeal exudate.  Eyes:     General: No scleral icterus.    Pupils: Pupils are equal, round, and reactive to light.  Neck:     Musculoskeletal: Normal range of motion.  Cardiovascular:     Rate and Rhythm: Normal rate and regular rhythm.     Pulses: Normal pulses.      Heart sounds: No murmur.  Pulmonary:     Effort: Pulmonary effort is normal. No respiratory distress.     Breath sounds: No wheezing, rhonchi or rales.  Abdominal:     General: Abdomen is flat. There is no distension.     Tenderness: There is no abdominal tenderness.  Musculoskeletal:        General: No swelling or tenderness.     Comments: R arm AVF + bruit/ + thrill  Skin:    General: Skin is warm and dry.  Neurological:     Mental Status: He is alert.    Lab Results: Lab Results  Component Value Date   WBC 8.5 10/09/2018   HGB 12.0 (L) 10/09/2018   HCT 39.1 10/09/2018   MCV 93.1 10/09/2018   PLT 286 10/09/2018    Lab Results  Component Value Date   CREATININE 11.15 (H) 10/09/2018   BUN 70 (H) 10/09/2018   NA 136 10/09/2018   K 4.5 10/09/2018   CL 99 10/09/2018   CO2 21 (L) 10/09/2018    Lab Results  Component Value Date   ALT 59 (H) 10/09/2018   AST 17 10/09/2018   ALKPHOS 132 (H) 10/09/2018   BILITOT 0.7 10/09/2018     Medical records, labs and imaging reviewed from recent hospitalization.    Assessment & Plan:   Problem List Items Addressed This Visit      Unprioritized   Bacteremia due to Klebsiella pneumoniae - Primary    Third hospitalization for ampicillin resistant klebsiella pneumoniae bacteremia. He has been given 2 weeks of cephalexin therapy for which he is tolerating well. He has no evidence of pneumonia, does not make urine and had completely decompressed bladder with last CT scan and some stones/cysts for which he is not symptomatic for. No identified nidus of recurrent infections. He will continue  current antibiotic plan and return in 3 weeks (I requested 2 weeks but he would rather 3) to be watched closely - he will return to see either Dr. Megan Salon or Dr. Baxter Flattery.       Left wrist pain    Saw a rheumatologist - planning MRI to further evaluate. Wrist aspirate negative for growth - unfortunately no crystal analysis or cell count was done on  sample.       C. difficile colitis    Improved per his account. Still having 2-3 loose/mushy stools a day off vancomycin therapy (though the should finish). I originally wanted to resume QID dosing for him however due to cost will restart vancomycin BID throughout remainder of cephalexin with consideration to increase back to QID if he has return/worsening of diarrhea symptoms. He will continue BID dosing beyond antibiotic completion x 7days.       Relevant Medications   vancomycin (VANCOCIN) 125 MG capsule      Janene Madeira, MSN, NP-C [facility] [place] Pager: [phone] Office: [phone]  10/17/18  3:16 PM

## 2018-10-15 NOTE — Telephone Encounter (Signed)
Verbal orders given via VM 

## 2018-10-15 NOTE — Telephone Encounter (Signed)
Copied from [place] [id number]. Topic: Quick Communication - Home Health Verbal Orders >> Oct 15, 2018  8:52 AM Vernona Rieger wrote: Caller/Agency: [name], with [facility] Number: [phone] Requesting OT/PT/Skilled Nursing/Social Work: Physical Therapy  Frequency: twice a week for four weeks

## 2018-10-16 ENCOUNTER — Inpatient Hospital Stay: Payer: Medicare Other | Admitting: Infectious Diseases

## 2018-10-16 DIAGNOSIS — E1122 Type 2 diabetes mellitus with diabetic chronic kidney disease: Secondary | ICD-10-CM | POA: Diagnosis not present

## 2018-10-16 DIAGNOSIS — N400 Enlarged prostate without lower urinary tract symptoms: Secondary | ICD-10-CM | POA: Diagnosis not present

## 2018-10-16 DIAGNOSIS — M5412 Radiculopathy, cervical region: Secondary | ICD-10-CM | POA: Diagnosis not present

## 2018-10-16 DIAGNOSIS — B179 Acute viral hepatitis, unspecified: Secondary | ICD-10-CM | POA: Diagnosis not present

## 2018-10-16 DIAGNOSIS — I132 Hypertensive heart and chronic kidney disease with heart failure and with stage 5 chronic kidney disease, or end stage renal disease: Secondary | ICD-10-CM | POA: Diagnosis not present

## 2018-10-16 DIAGNOSIS — Z9861 Coronary angioplasty status: Secondary | ICD-10-CM | POA: Diagnosis not present

## 2018-10-16 DIAGNOSIS — N2581 Secondary hyperparathyroidism of renal origin: Secondary | ICD-10-CM | POA: Diagnosis not present

## 2018-10-16 DIAGNOSIS — F329 Major depressive disorder, single episode, unspecified: Secondary | ICD-10-CM | POA: Diagnosis not present

## 2018-10-16 DIAGNOSIS — A4150 Gram-negative sepsis, unspecified: Secondary | ICD-10-CM | POA: Diagnosis not present

## 2018-10-16 DIAGNOSIS — A0472 Enterocolitis due to Clostridium difficile, not specified as recurrent: Secondary | ICD-10-CM | POA: Diagnosis not present

## 2018-10-16 DIAGNOSIS — D509 Iron deficiency anemia, unspecified: Secondary | ICD-10-CM | POA: Diagnosis not present

## 2018-10-16 DIAGNOSIS — M109 Gout, unspecified: Secondary | ICD-10-CM | POA: Diagnosis not present

## 2018-10-16 DIAGNOSIS — I5042 Chronic combined systolic (congestive) and diastolic (congestive) heart failure: Secondary | ICD-10-CM | POA: Diagnosis not present

## 2018-10-16 DIAGNOSIS — I5082 Biventricular heart failure: Secondary | ICD-10-CM | POA: Diagnosis not present

## 2018-10-16 DIAGNOSIS — I2510 Atherosclerotic heart disease of native coronary artery without angina pectoris: Secondary | ICD-10-CM | POA: Diagnosis not present

## 2018-10-16 DIAGNOSIS — E1142 Type 2 diabetes mellitus with diabetic polyneuropathy: Secondary | ICD-10-CM | POA: Diagnosis not present

## 2018-10-16 DIAGNOSIS — I252 Old myocardial infarction: Secondary | ICD-10-CM | POA: Diagnosis not present

## 2018-10-16 DIAGNOSIS — F0280 Dementia in other diseases classified elsewhere without behavioral disturbance: Secondary | ICD-10-CM | POA: Diagnosis not present

## 2018-10-16 DIAGNOSIS — M48062 Spinal stenosis, lumbar region with neurogenic claudication: Secondary | ICD-10-CM | POA: Diagnosis not present

## 2018-10-16 DIAGNOSIS — J449 Chronic obstructive pulmonary disease, unspecified: Secondary | ICD-10-CM | POA: Diagnosis not present

## 2018-10-16 DIAGNOSIS — Z8701 Personal history of pneumonia (recurrent): Secondary | ICD-10-CM | POA: Diagnosis not present

## 2018-10-16 DIAGNOSIS — E119 Type 2 diabetes mellitus without complications: Secondary | ICD-10-CM | POA: Diagnosis not present

## 2018-10-16 DIAGNOSIS — I255 Ischemic cardiomyopathy: Secondary | ICD-10-CM | POA: Diagnosis not present

## 2018-10-16 DIAGNOSIS — E1151 Type 2 diabetes mellitus with diabetic peripheral angiopathy without gangrene: Secondary | ICD-10-CM | POA: Diagnosis not present

## 2018-10-16 DIAGNOSIS — E039 Hypothyroidism, unspecified: Secondary | ICD-10-CM | POA: Diagnosis not present

## 2018-10-16 DIAGNOSIS — N186 End stage renal disease: Secondary | ICD-10-CM | POA: Diagnosis not present

## 2018-10-16 DIAGNOSIS — D631 Anemia in chronic kidney disease: Secondary | ICD-10-CM | POA: Diagnosis not present

## 2018-10-16 DIAGNOSIS — R1310 Dysphagia, unspecified: Secondary | ICD-10-CM | POA: Diagnosis not present

## 2018-10-17 ENCOUNTER — Encounter (INDEPENDENT_AMBULATORY_CARE_PROVIDER_SITE_OTHER): Payer: Medicare Other

## 2018-10-17 ENCOUNTER — Encounter: Payer: Self-pay | Admitting: Internal Medicine

## 2018-10-17 ENCOUNTER — Ambulatory Visit (INDEPENDENT_AMBULATORY_CARE_PROVIDER_SITE_OTHER): Payer: Medicare Other | Admitting: Internal Medicine

## 2018-10-17 ENCOUNTER — Other Ambulatory Visit (INDEPENDENT_AMBULATORY_CARE_PROVIDER_SITE_OTHER): Payer: Medicare Other

## 2018-10-17 ENCOUNTER — Other Ambulatory Visit: Payer: Self-pay | Admitting: Cardiology

## 2018-10-17 VITALS — BP 122/74 | HR 95 | Temp 97.9°F | Ht 73.0 in | Wt 215.0 lb

## 2018-10-17 DIAGNOSIS — B961 Klebsiella pneumoniae [K. pneumoniae] as the cause of diseases classified elsewhere: Secondary | ICD-10-CM

## 2018-10-17 DIAGNOSIS — E1121 Type 2 diabetes mellitus with diabetic nephropathy: Secondary | ICD-10-CM | POA: Diagnosis not present

## 2018-10-17 DIAGNOSIS — R55 Syncope and collapse: Secondary | ICD-10-CM | POA: Diagnosis not present

## 2018-10-17 DIAGNOSIS — I10 Essential (primary) hypertension: Secondary | ICD-10-CM | POA: Diagnosis not present

## 2018-10-17 DIAGNOSIS — R7881 Bacteremia: Secondary | ICD-10-CM | POA: Diagnosis not present

## 2018-10-17 DIAGNOSIS — I480 Paroxysmal atrial fibrillation: Secondary | ICD-10-CM | POA: Diagnosis not present

## 2018-10-17 LAB — CBC WITH DIFFERENTIAL/PLATELET
Basophils Absolute: 0.3 10*3/uL — ABNORMAL HIGH (ref 0.0–0.1)
Basophils Relative: 2.3 % (ref 0.0–3.0)
Eosinophils Absolute: 0.1 10*3/uL (ref 0.0–0.7)
Eosinophils Relative: 1.2 % (ref 0.0–5.0)
HCT: 38.7 % — ABNORMAL LOW (ref 39.0–52.0)
Hemoglobin: 12.6 g/dL — ABNORMAL LOW (ref 13.0–17.0)
LYMPHS PCT: 17.5 % (ref 12.0–46.0)
Lymphs Abs: 1.9 10*3/uL (ref 0.7–4.0)
MCHC: 32.6 g/dL (ref 30.0–36.0)
MCV: 93.5 fl (ref 78.0–100.0)
Monocytes Absolute: 1.2 10*3/uL — ABNORMAL HIGH (ref 0.1–1.0)
Monocytes Relative: 10.9 % (ref 3.0–12.0)
Neutro Abs: 7.3 10*3/uL (ref 1.4–7.7)
Neutrophils Relative %: 68.1 % (ref 43.0–77.0)
Platelets: 299 10*3/uL (ref 150.0–400.0)
RBC: 4.13 Mil/uL — AB (ref 4.22–5.81)
RDW: 20.5 % — ABNORMAL HIGH (ref 11.5–15.5)
WBC: 10.8 10*3/uL — ABNORMAL HIGH (ref 4.0–10.5)

## 2018-10-17 LAB — BASIC METABOLIC PANEL
BUN: 41 mg/dL — ABNORMAL HIGH (ref 6–23)
CO2: 27 meq/L (ref 19–32)
Calcium: 8.7 mg/dL (ref 8.4–10.5)
Chloride: 97 mEq/L (ref 96–112)
Creatinine, Ser: 8.67 mg/dL (ref 0.40–1.50)
GFR: 7.4 mL/min — CL (ref >60.00)
Glucose, Bld: 196 mg/dL — ABNORMAL HIGH (ref 70–99)
POTASSIUM: 4.4 meq/L (ref 3.5–5.1)
Sodium: 141 mEq/L (ref 135–145)

## 2018-10-17 NOTE — Patient Instructions (Addendum)

## 2018-10-17 NOTE — Assessment & Plan Note (Addendum)
Third hospitalization for ampicillin resistant klebsiella pneumoniae bacteremia. He has been given 2 weeks of cephalexin therapy for which he is tolerating well. He has no evidence of pneumonia, does not make urine and had completely decompressed bladder with last CT scan and some stones/cysts for which he is not symptomatic for. No identified nidus of recurrent infections. He will continue current antibiotic plan and return in 3 weeks (I requested 2 weeks but he would rather 3) to be watched closely - he will return to see either Dr. Megan Salon or Dr. Baxter Flattery.

## 2018-10-17 NOTE — Assessment & Plan Note (Signed)
Saw a rheumatologist - planning MRI to further evaluate. Wrist aspirate negative for growth - unfortunately no crystal analysis or cell count was done on sample.

## 2018-10-17 NOTE — Assessment & Plan Note (Signed)
Improved per his account. Still having 2-3 loose/mushy stools a day off vancomycin therapy (though the should finish). I originally wanted to resume QID dosing for him however due to cost will restart vancomycin BID throughout remainder of cephalexin with consideration to increase back to QID if he has return/worsening of diarrhea symptoms. He will continue BID dosing beyond antibiotic completion x 7days.

## 2018-10-17 NOTE — Progress Notes (Signed)
Subjective:    Patient ID: [name], male    DOB: 06-22-1951, 68 y.o.   MRN: [id number]  HPI  69 y.o.malewithESRD on hemodialysis on Tuesday Thursday Saturday, CAD, hypothyroidism, gout, recently admitted for sepsis and has had multiple episodes of bacteremia and also recently treated for C. difficile still on vancomycin was not feeling well for last couple of days feeling tired and weak.  Evening prior to admission patient found it difficult to walk and had to rest on his bed following which patient wife found that patient was very confused.  Patient wife states that patient has had left wrist gout attack and has started back on prednisone recently which patient took first dose.After admission, blood culture returned with Klebsiella pneumonia of unclear source.  Seen per ID.  No evidence of pneumonia, UTI, intra-abdominal infection, AV fistula infection or left wrist infection. He had a negative TEE last month. As per ID, Klebsiella is unlikely to cause endocarditis and they did not feel that he needed repeat TEE. Treated in the hospital with IV ceftriaxone. Follow up blood cultures x2 from 1/6: Negative.  Transitioned to renal dose keflex for total 14 days, and f/u with ID.  Pt cont's to tolerate the keflex well, in addition to the oral vancomycin for prior c diff.    ALso Has appt with Neurology soon.  Also Seeing rheum with left wrist injection, may need MRI per wife, plans to f/u.  Has seen urology and holding on tx for prostate ca for now.  Today, Pt denies chest pain, increased sob or doe, wheezing, orthopnea, PND, increased LE swelling, palpitations, dizziness or syncope.  Pt denies new neurological symptoms such as new headache, or facial or extremity weakness or numbness, though did have: Right cerebellar infarct Neurology consulted, signed off 1/6 MRA head no large vessel occlusion Carotid Doppler 1 - 39% stenosis bilaterally Echocardiogram without cardiac source of emboli  identified EEG normal EEG, no focal lateralizing or epileptiform features PT OT recommending home health Continue aspirin 325 mg daily. Outpatient follow-up with neurology.  Also Pt denies polydipsia, polyuria  Past Medical History:  Diagnosis Date  . Acute encephalopathy 10/04/2018  . Allergic rhinitis, cause unspecified 02/24/2014  . Anemia 06/16/2011  . BENIGN PROSTATIC HYPERTROPHY 10/14/2009  . CAD, NATIVE VESSEL 02/06/2009   a. 06/2007 s/p Taxus DES to the RCA;  b. 08/2016 NSTEMI in setting of SVT/PCI: LM 30ost, LAD 63m (3.0x16 Synergy DES), LCX 41m, OM1 60, RI 40, RCA 70p/m, 67m - not amenable to PCI.  [name] Cervical radiculopathy, chronic 02/23/2016   Right c5-6 by NCS/EMG  . Chest pain 08/09/2015  . Chronic combined systolic (congestive) and diastolic (congestive) heart failure ([place])    a. 10/2016 Echo: EF 40-45%, Gr2 DD. mildly dil LA.  [name] COLONIC POLYPS, HX OF 10/14/2009  . Dementia ([place]) [id number]  . Depression 09/24/2015  . DIABETES MELLITUS, TYPE II 02/01/2010  . DYSLIPIDEMIA 06/18/2007  . ESRD (end stage renal disease) on dialysis ([place]) 08/04/2010   ESRD due to DM adn HTN, started HD in 2011. As of 2019 has a R arm graft and gets HD on TTS sched  . FOOT PAIN 08/12/2008  . GAIT DISTURBANCE 03/03/2010  . GASTROENTERITIS, VIRAL 10/14/2009  . GERD 06/18/2007  . GOITER, MULTINODULAR 12/26/2007  . GOUT 06/18/2007  . GYNECOMASTIA 07/17/2010  . Hemodialysis access, fistula mature [facility])    Dialysis T-Th-Sa ([place]) Right upper arm fistula  . Hyperlipidemia 10/16/2011  . Hyperparathyroidism, secondary (  [place]) 06/16/2011  . HYPERTENSION 06/18/2007  . Hyperthyroidism   . Hypocalcemia 06/07/2010  . Ischemic cardiomyopathy    a. 10/2016 Echo: EF 40-45%.  . Lumbar stenosis with neurogenic claudication   . ONYCHOMYCOSIS, TOENAILS 12/26/2007  . OSA on CPAP 10/16/2011  . Other malaise and fatigue 11/24/2009  . PERIPHERAL NEUROPATHY 06/18/2007  . Prostate cancer ([place])   . PSVT (paroxysmal  supraventricular tachycardia) ([place])    a. 84/1660 complicated by NSTEMI;  b. 11/2016 Treated w/ adenosine in ED;  c. 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  [name] PULMONARY NODULE, RIGHT LOWER LOBE 06/08/2009  . Sleep apnea    cpap machine and o2  . TRANSAMINASES, SERUM, ELEVATED 02/01/2010  . Transfusion history    none recent  . Unspecified hypotension 01/30/2010   Past Surgical History:  Procedure Laterality Date  . A/V SHUNTOGRAM N/A 04/07/2018   Procedure: A/V SHUNTOGRAM;  Surgeon: Waynetta Sandy, MD;  Location: [place] CV LAB;  Service: Cardiovascular;  Laterality: N/A;  . ARTERIOVENOUS GRAFT PLACEMENT Right 2009   forearm/notes 02/01/2011  . AV FISTULA PLACEMENT  11/07/2011   Procedure: INSERTION OF ARTERIOVENOUS (AV) GORE-TEX GRAFT ARM;  Surgeon: Tinnie Gens, MD;  Location: [place];  Service: Vascular;  Laterality: Left;  . BACK SURGERY  1998  . BASCILIC VEIN TRANSPOSITION Right 02/27/2013   Procedure: BASCILIC VEIN TRANSPOSITION;  Surgeon: Mal Misty, MD;  Location: [place];  Service: Vascular;  Laterality: Right;  Right Basilic Vein Transposition   . CARDIAC CATHETERIZATION N/A 08/06/2016   Procedure: Left Heart Cath and Coronary Angiography;  Surgeon: Jolaine Artist, MD;  Location: [place] CV [facility];  Service: Cardiovascular;  Laterality: N/A;  . CARDIAC CATHETERIZATION N/A 08/07/2016   Procedure: Coronary/Graft Atherectomy-CSI LAD;  Surgeon: Peter M Martinique, MD;  Location: [place] [facility];  Service: Cardiovascular;  Laterality: N/A;  . CERVICAL SPINE SURGERY  2/09   "to repair nerve problems in my left arm"  . CHOLECYSTECTOMY    . COLONOSCOPY WITH PROPOFOL N/A 04/26/2017   Procedure: COLONOSCOPY WITH PROPOFOL;  Surgeon: Otis Brace, MD;  Location: [place];  Service: Gastroenterology;  Laterality: N/A;  . CORONARY ANGIOPLASTY WITH STENT PLACEMENT  06/11/2008  . CORONARY ANGIOPLASTY WITH STENT PLACEMENT  06/2007   TAXUS stent to RCA/notes 01/31/2011  .  ESOPHAGOGASTRODUODENOSCOPY  09/28/2011   Procedure: ESOPHAGOGASTRODUODENOSCOPY (EGD);  Surgeon: Missy Sabins, MD;  Location: [facility] ENDOSCOPY;  Service: Endoscopy;  Laterality: N/A;  . ESOPHAGOGASTRODUODENOSCOPY N/A 04/07/2015   Procedure: ESOPHAGOGASTRODUODENOSCOPY (EGD);  Surgeon: Teena Irani, MD;  Location: [facility];  Service: Endoscopy;  Laterality: N/A;  . ESOPHAGOGASTRODUODENOSCOPY N/A 04/19/2015   Procedure: ESOPHAGOGASTRODUODENOSCOPY (EGD);  Surgeon: Arta Silence, MD;  [facility];  Service: Endoscopy;  Laterality: N/A;  . ESOPHAGOGASTRODUODENOSCOPY (EGD) WITH PROPOFOL N/A 06/13/2018   Procedure: ESOPHAGOGASTRODUODENOSCOPY (EGD) WITH PROPOFOL;  Surgeon: Wonda Horner, MD;  Location: [facility] ENDOSCOPY;  Service: Endoscopy;  Laterality: N/A;  . FLEXIBLE SIGMOIDOSCOPY N/A 05/21/2017   Procedure: Conni Elliot;  Surgeon: Clarene Essex, MD;  Location: [place];  Service: Endoscopy;  Laterality: N/A;  . FLEXIBLE SIGMOIDOSCOPY Left 07/02/2017   Procedure: FLEXIBLE SIGMOIDOSCOPY;  Surgeon: Laurence Spates, MD;  Location: [place];  Service: Endoscopy;  Laterality: Left;  . FOREIGN BODY REMOVAL  09/2003   via upper endoscopy/notes 02/12/2011  . GIVENS CAPSULE STUDY  09/30/2011   Procedure: GIVENS CAPSULE STUDY;  Surgeon: Jeryl Columbia, MD;  Location: [facility] ENDOSCOPY;  Service: Endoscopy;  Laterality: N/A;  . INSERTION OF DIALYSIS  CATHETER Right 2014  . INSERTION OF DIALYSIS CATHETER Left 02/11/2013   Procedure: INSERTION OF DIALYSIS CATHETER;  Surgeon: Conrad Buchanan, MD;  Location: [place];  Service: Vascular;  Laterality: Left;  Ultrasound guided  . LUMBAR LAMINECTOMY/DECOMPRESSION MICRODISCECTOMY Bilateral 07/31/2017   Procedure: LAMINECTOMY AND FORAMINOTOMY- BILATERAL LUMBAR TWO- LUMBAR THREE;  Surgeon: Earnie Larsson, MD;  Location: [place];  Service: Neurosurgery;  Laterality: Bilateral;  LAMINECTOMY AND FORAMINOTOMY- BILATERAL LUMBAR 2- LUMBAR 3  . PERIPHERAL VASCULAR BALLOON  ANGIOPLASTY  04/07/2018   Procedure: PERIPHERAL VASCULAR BALLOON ANGIOPLASTY;  Surgeon: Waynetta Sandy, MD;  Location: [place] CV LAB;  Service: Cardiovascular;;  RUE AVF  . REMOVAL OF A DIALYSIS CATHETER Right 02/11/2013   Procedure: REMOVAL OF A DIALYSIS CATHETER;  Surgeon: Conrad [place], MD;  Location: [place];  Service: Vascular;  Laterality: Right;  . SAVORY DILATION N/A 04/07/2015   Procedure: SAVORY DILATION;  Surgeon: Teena Irani, MD;  Location: [facility] ENDOSCOPY;  Service: Endoscopy;  Laterality: N/A;  . SHUNTOGRAM N/A 09/20/2011   Procedure: Earney Mallet;  Surgeon: Conrad La Farge, MD;  Location: [facility] CATH LAB;  Service: Cardiovascular;  Laterality: N/A;  . SVT ABLATION N/A 11/26/2016   Procedure: SVT Ablation;  Surgeon: Will Meredith Leeds, MD;  Location: [place] CV LAB;  Service: Cardiovascular;  Laterality: N/A;  . TEE WITHOUT CARDIOVERSION N/A 08/25/2018   Procedure: TRANSESOPHAGEAL ECHOCARDIOGRAM (TEE);  Surgeon: Dorothy Spark, MD;  Location: [facility] ENDOSCOPY;  Service: Cardiovascular;  Laterality: N/A;  . TONSILLECTOMY    . TOTAL KNEE ARTHROPLASTY Right 08/02/2015   Procedure: TOTAL KNEE ARTHROPLASTY;  Surgeon: Renette Butters, MD;  Location: [place];  Service: Orthopedics;  Laterality: Right;  . VENOGRAM N/A 01/26/2013   Procedure: VENOGRAM;  Surgeon: Angelia Mould, MD;  Location: [facility] CATH LAB;  Service: Cardiovascular;  Laterality: N/A;    reports that he quit smoking about 13 years ago. His smoking use included cigarettes. He has a 25.00 pack-year smoking history. He quit smokeless tobacco use about 13 years ago. He reports that he does not drink alcohol or use drugs. family history includes Diabetes in his father; Healthy in his child, child, and child; Heart disease in his father and sister; Hypertension in his father; Kidney failure in his father; Thyroid nodules in his sister. Allergies  Allergen Reactions  . Statins Other (See Comments)    Weak muscles  .  Ciprofloxacin Rash   Current Outpatient Medications on File Prior to Visit  Medication Sig Dispense Refill  . acetaminophen (TYLENOL) 325 MG tablet Take 650 mg by mouth every 6 (six) hours as needed for mild pain or headache.     . Alirocumab (PRALUENT) 150 MG/ML SOPN Inject 150 mg into the skin every 14 (fourteen) days. 2 pen 12  . allopurinol (ZYLOPRIM) 100 MG tablet TAKE ONE TABLET BY MOUTH ONCE DAILY (Patient taking differently: Take 100 mg by mouth daily. ) 90 tablet 3  . aspirin EC 325 MG tablet Take 1 tablet (325 mg total) by mouth daily. 30 tablet 0  . betamethasone dipropionate (DIPROLENE) 0.05 % cream Apply 1 application topically daily as needed (leg sores).    . citalopram (CELEXA) 10 MG tablet Take 1 tablet (10 mg total) by mouth at bedtime. 90 tablet 3  . colchicine 0.6 MG tablet Take 1 tablet (0.6 mg total) by mouth daily. 90 tablet 3  . doxercalciferol ([name]) 4 MCG/2ML injection Inject 1 mL (2 mcg total) into the vein Every Tuesday,Thursday,and Saturday with dialysis.  2 mL 0  . ezetimibe (ZETIA) 10 MG tablet TAKE ONE TABLET BY MOUTH ONCE DAILY (Patient taking differently: Take 10 mg by mouth daily. ) 90 tablet 3  . ferric citrate (AURYXIA) 1 GM 210 MG(Fe) tablet Take 2 tablets (420 mg total) by mouth 3 (three) times daily with meals. 60 tablet 0  . gabapentin (NEURONTIN) 100 MG capsule Take 1 capsule (100 mg total) by mouth at bedtime. 30 capsule 0  . hydroxypropyl methylcellulose / hypromellose (ISOPTO TEARS / GONIOVISC) 2.5 % ophthalmic solution Place 1 drop into both eyes 3 (three) times daily as needed for dry eyes. 15 mL 11  . methimazole (TAPAZOLE) 5 MG tablet Take 0.5 tablets (2.5 mg total) by mouth daily. 45 tablet 3  . multivitamin (RENA-VIT) TABS tablet Take 1 tablet by mouth daily. Reported on 10/24/2015    . Nutritional Supplements (FEEDING SUPPLEMENT, NEPRO CARB STEADY,) LIQD Take 237 mLs by mouth 2 (two) times daily between meals. 7 Can 0  . polyethylene glycol  (MIRALAX / GLYCOLAX) packet Take 17 g by mouth daily. 14 each 0  . SENSIPAR 90 MG tablet Take 90 mg by mouth 2 (two) times daily.    . vancomycin (VANCOCIN) 125 MG capsule Take 1 capsule (125 mg total) by mouth 2 (two) times daily. 24 capsule 0  . WELCHOL 625 MG tablet TAKE THREE TABLETS BY MOUTH TWICE DAILY WITH MEALS (Patient taking differently: Take 1,875 mg by mouth 2 (two) times daily with a meal. ) 180 tablet 11  . zolpidem (AMBIEN) 5 MG tablet Take 1 tablet (5 mg total) by mouth at bedtime as needed. for sleep (Patient taking differently: Take 5 mg by mouth at bedtime as needed for sleep. ) 90 tablet 1   No current facility-administered medications on file prior to visit.    Review of Systems  Constitutional: Negative for other unusual diaphoresis or sweats HENT: Negative for ear discharge or swelling Eyes: Negative for other worsening visual disturbances Respiratory: Negative for stridor or other swelling  Gastrointestinal: Negative for worsening distension or other blood Genitourinary: Negative for retention or other urinary change Musculoskeletal: Negative for other MSK pain or swelling Skin: Negative for color change or other new lesions Neurological: Negative for worsening tremors and other numbness  Psychiatric/Behavioral: Negative for worsening agitation or other fatigue ALl other system neg per pt    Objective:   Physical Exam BP 122/74   Pulse 95   Temp 97.9 F (36.6 C) (Oral)   Ht 6\' 1"  (1.854 m)   Wt 215 lb (97.5 kg)   SpO2 96%   BMI 28.37 kg/m  VS noted,  Constitutional: Pt appears in NAD HENT: Head: NCAT.  Right Ear: External ear normal.  Left Ear: External ear normal.  Eyes: . Pupils are equal, round, and reactive to light. Conjunctivae and EOM are normal Nose: without d/c or deformity Neck: Neck supple. Gross normal ROM Cardiovascular: Normal rate and regular rhythm.   Pulmonary/Chest: Effort normal and breath sounds without rales or wheezing.  Abd:   Soft, NT, ND, + BS, no organomegaly Neurological: Pt is alert. At baseline orientation, motor grossly intact Skin: Skin is warm. No rashes, other new lesions, no LE edema Psychiatric: Pt behavior is normal without agitation  No other exam findings Lab Results  Component Value Date   WBC 10.8 (H) 10/17/2018   HGB 12.6 (L) 10/17/2018   HCT 38.7 (L) 10/17/2018   PLT 299.0 10/17/2018   GLUCOSE 196 (H) 10/17/2018   CHOL  171 10/04/2018   TRIG 185 (H) 10/04/2018   HDL 66 10/04/2018   LDLDIRECT 136.0 07/16/2014   LDLCALC 68 10/04/2018   ALT 59 (H) 10/09/2018   AST 17 10/09/2018   NA 141 10/17/2018   K 4.4 10/17/2018   CL 97 10/17/2018   CREATININE 8.67 (HH) 10/17/2018   BUN 41 (H) 10/17/2018   CO2 27 10/17/2018   TSH 0.70 08/18/2018   PSA 3.67 08/30/2014   INR 1.15 08/19/2018   HGBA1C 6.0 (H) 10/04/2018        Assessment & Plan:

## 2018-10-18 DIAGNOSIS — N2581 Secondary hyperparathyroidism of renal origin: Secondary | ICD-10-CM | POA: Diagnosis not present

## 2018-10-18 DIAGNOSIS — D509 Iron deficiency anemia, unspecified: Secondary | ICD-10-CM | POA: Diagnosis not present

## 2018-10-18 DIAGNOSIS — N186 End stage renal disease: Secondary | ICD-10-CM | POA: Diagnosis not present

## 2018-10-18 DIAGNOSIS — E119 Type 2 diabetes mellitus without complications: Secondary | ICD-10-CM | POA: Diagnosis not present

## 2018-10-18 DIAGNOSIS — D631 Anemia in chronic kidney disease: Secondary | ICD-10-CM | POA: Diagnosis not present

## 2018-10-19 NOTE — Assessment & Plan Note (Signed)
Resolved to finish keflex asd, f/u with ID as planned, for repeat labs today as recommended at hosp d/c

## 2018-10-19 NOTE — Assessment & Plan Note (Signed)
Stable, cont to f/u with endo

## 2018-10-19 NOTE — Assessment & Plan Note (Signed)
stable overall by history and exam, recent data reviewed with pt, and pt to continue medical treatment as before,  to f/u any worsening symptoms or concerns  

## 2018-10-20 DIAGNOSIS — A4150 Gram-negative sepsis, unspecified: Secondary | ICD-10-CM | POA: Diagnosis not present

## 2018-10-20 DIAGNOSIS — I132 Hypertensive heart and chronic kidney disease with heart failure and with stage 5 chronic kidney disease, or end stage renal disease: Secondary | ICD-10-CM | POA: Diagnosis not present

## 2018-10-20 DIAGNOSIS — I5082 Biventricular heart failure: Secondary | ICD-10-CM | POA: Diagnosis not present

## 2018-10-20 DIAGNOSIS — I5042 Chronic combined systolic (congestive) and diastolic (congestive) heart failure: Secondary | ICD-10-CM | POA: Diagnosis not present

## 2018-10-20 DIAGNOSIS — A0472 Enterocolitis due to Clostridium difficile, not specified as recurrent: Secondary | ICD-10-CM | POA: Diagnosis not present

## 2018-10-20 DIAGNOSIS — E1122 Type 2 diabetes mellitus with diabetic chronic kidney disease: Secondary | ICD-10-CM | POA: Diagnosis not present

## 2018-10-21 DIAGNOSIS — D631 Anemia in chronic kidney disease: Secondary | ICD-10-CM | POA: Diagnosis not present

## 2018-10-21 DIAGNOSIS — N186 End stage renal disease: Secondary | ICD-10-CM | POA: Diagnosis not present

## 2018-10-21 DIAGNOSIS — N2581 Secondary hyperparathyroidism of renal origin: Secondary | ICD-10-CM | POA: Diagnosis not present

## 2018-10-21 DIAGNOSIS — D509 Iron deficiency anemia, unspecified: Secondary | ICD-10-CM | POA: Diagnosis not present

## 2018-10-21 DIAGNOSIS — E119 Type 2 diabetes mellitus without complications: Secondary | ICD-10-CM | POA: Diagnosis not present

## 2018-10-22 DIAGNOSIS — I5042 Chronic combined systolic (congestive) and diastolic (congestive) heart failure: Secondary | ICD-10-CM | POA: Diagnosis not present

## 2018-10-22 DIAGNOSIS — I132 Hypertensive heart and chronic kidney disease with heart failure and with stage 5 chronic kidney disease, or end stage renal disease: Secondary | ICD-10-CM | POA: Diagnosis not present

## 2018-10-22 DIAGNOSIS — A4150 Gram-negative sepsis, unspecified: Secondary | ICD-10-CM | POA: Diagnosis not present

## 2018-10-22 DIAGNOSIS — A0472 Enterocolitis due to Clostridium difficile, not specified as recurrent: Secondary | ICD-10-CM | POA: Diagnosis not present

## 2018-10-22 DIAGNOSIS — I5082 Biventricular heart failure: Secondary | ICD-10-CM | POA: Diagnosis not present

## 2018-10-22 DIAGNOSIS — E1122 Type 2 diabetes mellitus with diabetic chronic kidney disease: Secondary | ICD-10-CM | POA: Diagnosis not present

## 2018-10-23 DIAGNOSIS — D509 Iron deficiency anemia, unspecified: Secondary | ICD-10-CM | POA: Diagnosis not present

## 2018-10-23 DIAGNOSIS — E119 Type 2 diabetes mellitus without complications: Secondary | ICD-10-CM | POA: Diagnosis not present

## 2018-10-23 DIAGNOSIS — D631 Anemia in chronic kidney disease: Secondary | ICD-10-CM | POA: Diagnosis not present

## 2018-10-23 DIAGNOSIS — N186 End stage renal disease: Secondary | ICD-10-CM | POA: Diagnosis not present

## 2018-10-23 DIAGNOSIS — N2581 Secondary hyperparathyroidism of renal origin: Secondary | ICD-10-CM | POA: Diagnosis not present

## 2018-10-25 DIAGNOSIS — D631 Anemia in chronic kidney disease: Secondary | ICD-10-CM | POA: Diagnosis not present

## 2018-10-25 DIAGNOSIS — N2581 Secondary hyperparathyroidism of renal origin: Secondary | ICD-10-CM | POA: Diagnosis not present

## 2018-10-25 DIAGNOSIS — N186 End stage renal disease: Secondary | ICD-10-CM | POA: Diagnosis not present

## 2018-10-25 DIAGNOSIS — D509 Iron deficiency anemia, unspecified: Secondary | ICD-10-CM | POA: Diagnosis not present

## 2018-10-25 DIAGNOSIS — E119 Type 2 diabetes mellitus without complications: Secondary | ICD-10-CM | POA: Diagnosis not present

## 2018-10-27 DIAGNOSIS — E1122 Type 2 diabetes mellitus with diabetic chronic kidney disease: Secondary | ICD-10-CM | POA: Diagnosis not present

## 2018-10-27 DIAGNOSIS — I132 Hypertensive heart and chronic kidney disease with heart failure and with stage 5 chronic kidney disease, or end stage renal disease: Secondary | ICD-10-CM | POA: Diagnosis not present

## 2018-10-27 DIAGNOSIS — I5082 Biventricular heart failure: Secondary | ICD-10-CM | POA: Diagnosis not present

## 2018-10-27 DIAGNOSIS — A0472 Enterocolitis due to Clostridium difficile, not specified as recurrent: Secondary | ICD-10-CM | POA: Diagnosis not present

## 2018-10-27 DIAGNOSIS — I5042 Chronic combined systolic (congestive) and diastolic (congestive) heart failure: Secondary | ICD-10-CM | POA: Diagnosis not present

## 2018-10-27 DIAGNOSIS — A4150 Gram-negative sepsis, unspecified: Secondary | ICD-10-CM | POA: Diagnosis not present

## 2018-10-28 ENCOUNTER — Other Ambulatory Visit: Payer: Self-pay

## 2018-10-28 ENCOUNTER — Telehealth: Payer: Self-pay

## 2018-10-28 DIAGNOSIS — E119 Type 2 diabetes mellitus without complications: Secondary | ICD-10-CM | POA: Diagnosis not present

## 2018-10-28 DIAGNOSIS — R7881 Bacteremia: Secondary | ICD-10-CM

## 2018-10-28 DIAGNOSIS — D631 Anemia in chronic kidney disease: Secondary | ICD-10-CM | POA: Diagnosis not present

## 2018-10-28 DIAGNOSIS — D509 Iron deficiency anemia, unspecified: Secondary | ICD-10-CM | POA: Diagnosis not present

## 2018-10-28 DIAGNOSIS — N186 End stage renal disease: Secondary | ICD-10-CM | POA: Diagnosis not present

## 2018-10-28 DIAGNOSIS — N2581 Secondary hyperparathyroidism of renal origin: Secondary | ICD-10-CM | POA: Diagnosis not present

## 2018-10-28 DIAGNOSIS — A0472 Enterocolitis due to Clostridium difficile, not specified as recurrent: Secondary | ICD-10-CM

## 2018-10-28 DIAGNOSIS — B961 Klebsiella pneumoniae [K. pneumoniae] as the cause of diseases classified elsewhere: Secondary | ICD-10-CM

## 2018-10-28 MED ORDER — VANCOMYCIN HCL 125 MG PO CAPS: 125.0000 mg | ORAL_CAPSULE | Freq: Two times a day (BID) | ORAL | 0 refills | Status: DC

## 2018-10-28 NOTE — Telephone Encounter (Signed)
Returned patient's call regarding refill for anabiotics. Spoke with patient's wife who states that [name] only has enough medication to last him until 2/1. Patient would like a refill sent to [facility] on [address]. Prescription sent as requested patient will follow-up with Dr. Megan Salon on 2/12. [place]

## 2018-10-29 DIAGNOSIS — I5042 Chronic combined systolic (congestive) and diastolic (congestive) heart failure: Secondary | ICD-10-CM | POA: Diagnosis not present

## 2018-10-29 DIAGNOSIS — I132 Hypertensive heart and chronic kidney disease with heart failure and with stage 5 chronic kidney disease, or end stage renal disease: Secondary | ICD-10-CM | POA: Diagnosis not present

## 2018-10-29 DIAGNOSIS — I5082 Biventricular heart failure: Secondary | ICD-10-CM | POA: Diagnosis not present

## 2018-10-29 DIAGNOSIS — E1122 Type 2 diabetes mellitus with diabetic chronic kidney disease: Secondary | ICD-10-CM | POA: Diagnosis not present

## 2018-10-29 DIAGNOSIS — A4150 Gram-negative sepsis, unspecified: Secondary | ICD-10-CM | POA: Diagnosis not present

## 2018-10-29 DIAGNOSIS — A0472 Enterocolitis due to Clostridium difficile, not specified as recurrent: Secondary | ICD-10-CM | POA: Diagnosis not present

## 2018-10-30 DIAGNOSIS — D631 Anemia in chronic kidney disease: Secondary | ICD-10-CM | POA: Diagnosis not present

## 2018-10-30 DIAGNOSIS — N186 End stage renal disease: Secondary | ICD-10-CM | POA: Diagnosis not present

## 2018-10-30 DIAGNOSIS — N2581 Secondary hyperparathyroidism of renal origin: Secondary | ICD-10-CM | POA: Diagnosis not present

## 2018-10-30 DIAGNOSIS — D509 Iron deficiency anemia, unspecified: Secondary | ICD-10-CM | POA: Diagnosis not present

## 2018-10-30 DIAGNOSIS — E119 Type 2 diabetes mellitus without complications: Secondary | ICD-10-CM | POA: Diagnosis not present

## 2018-10-30 DIAGNOSIS — M1180 Other specified crystal arthropathies, unspecified site: Secondary | ICD-10-CM | POA: Diagnosis not present

## 2018-10-31 DIAGNOSIS — Z992 Dependence on renal dialysis: Secondary | ICD-10-CM | POA: Diagnosis not present

## 2018-10-31 DIAGNOSIS — I871 Compression of vein: Secondary | ICD-10-CM | POA: Diagnosis not present

## 2018-10-31 DIAGNOSIS — T82858A Stenosis of vascular prosthetic devices, implants and grafts, initial encounter: Secondary | ICD-10-CM | POA: Diagnosis not present

## 2018-10-31 DIAGNOSIS — N186 End stage renal disease: Secondary | ICD-10-CM | POA: Diagnosis not present

## 2018-11-01 DIAGNOSIS — Z992 Dependence on renal dialysis: Secondary | ICD-10-CM | POA: Diagnosis not present

## 2018-11-01 DIAGNOSIS — K8309 Other cholangitis: Secondary | ICD-10-CM | POA: Diagnosis not present

## 2018-11-01 DIAGNOSIS — D631 Anemia in chronic kidney disease: Secondary | ICD-10-CM | POA: Diagnosis not present

## 2018-11-01 DIAGNOSIS — I132 Hypertensive heart and chronic kidney disease with heart failure and with stage 5 chronic kidney disease, or end stage renal disease: Secondary | ICD-10-CM | POA: Diagnosis not present

## 2018-11-01 DIAGNOSIS — A4150 Gram-negative sepsis, unspecified: Secondary | ICD-10-CM | POA: Diagnosis not present

## 2018-11-01 DIAGNOSIS — E119 Type 2 diabetes mellitus without complications: Secondary | ICD-10-CM | POA: Diagnosis not present

## 2018-11-01 DIAGNOSIS — E1122 Type 2 diabetes mellitus with diabetic chronic kidney disease: Secondary | ICD-10-CM | POA: Diagnosis not present

## 2018-11-01 DIAGNOSIS — N186 End stage renal disease: Secondary | ICD-10-CM | POA: Diagnosis not present

## 2018-11-01 DIAGNOSIS — N2581 Secondary hyperparathyroidism of renal origin: Secondary | ICD-10-CM | POA: Diagnosis not present

## 2018-11-01 DIAGNOSIS — I5082 Biventricular heart failure: Secondary | ICD-10-CM | POA: Diagnosis not present

## 2018-11-01 DIAGNOSIS — A0472 Enterocolitis due to Clostridium difficile, not specified as recurrent: Secondary | ICD-10-CM | POA: Diagnosis not present

## 2018-11-01 DIAGNOSIS — D509 Iron deficiency anemia, unspecified: Secondary | ICD-10-CM | POA: Diagnosis not present

## 2018-11-01 DIAGNOSIS — E1129 Type 2 diabetes mellitus with other diabetic kidney complication: Secondary | ICD-10-CM | POA: Diagnosis not present

## 2018-11-01 DIAGNOSIS — I5042 Chronic combined systolic (congestive) and diastolic (congestive) heart failure: Secondary | ICD-10-CM | POA: Diagnosis not present

## 2018-11-03 DIAGNOSIS — A0472 Enterocolitis due to Clostridium difficile, not specified as recurrent: Secondary | ICD-10-CM | POA: Diagnosis not present

## 2018-11-03 DIAGNOSIS — E1122 Type 2 diabetes mellitus with diabetic chronic kidney disease: Secondary | ICD-10-CM | POA: Diagnosis not present

## 2018-11-03 DIAGNOSIS — I5042 Chronic combined systolic (congestive) and diastolic (congestive) heart failure: Secondary | ICD-10-CM | POA: Diagnosis not present

## 2018-11-03 DIAGNOSIS — I5082 Biventricular heart failure: Secondary | ICD-10-CM | POA: Diagnosis not present

## 2018-11-03 DIAGNOSIS — A4150 Gram-negative sepsis, unspecified: Secondary | ICD-10-CM | POA: Diagnosis not present

## 2018-11-03 DIAGNOSIS — I132 Hypertensive heart and chronic kidney disease with heart failure and with stage 5 chronic kidney disease, or end stage renal disease: Secondary | ICD-10-CM | POA: Diagnosis not present

## 2018-11-04 ENCOUNTER — Telehealth: Payer: Self-pay

## 2018-11-04 DIAGNOSIS — K8309 Other cholangitis: Secondary | ICD-10-CM | POA: Diagnosis not present

## 2018-11-04 DIAGNOSIS — N186 End stage renal disease: Secondary | ICD-10-CM | POA: Diagnosis not present

## 2018-11-04 DIAGNOSIS — N2581 Secondary hyperparathyroidism of renal origin: Secondary | ICD-10-CM | POA: Diagnosis not present

## 2018-11-04 DIAGNOSIS — E119 Type 2 diabetes mellitus without complications: Secondary | ICD-10-CM | POA: Diagnosis not present

## 2018-11-04 DIAGNOSIS — D509 Iron deficiency anemia, unspecified: Secondary | ICD-10-CM | POA: Diagnosis not present

## 2018-11-04 DIAGNOSIS — D631 Anemia in chronic kidney disease: Secondary | ICD-10-CM | POA: Diagnosis not present

## 2018-11-04 NOTE — Telephone Encounter (Signed)
Called to let the pt know that they had left missing parts of the praluent form and that they need to come by the office to fix it and that unless the pt's wife has healthcare power of attorney, she cannot sign the praluent form

## 2018-11-06 DIAGNOSIS — N2581 Secondary hyperparathyroidism of renal origin: Secondary | ICD-10-CM | POA: Diagnosis not present

## 2018-11-06 DIAGNOSIS — D509 Iron deficiency anemia, unspecified: Secondary | ICD-10-CM | POA: Diagnosis not present

## 2018-11-06 DIAGNOSIS — E119 Type 2 diabetes mellitus without complications: Secondary | ICD-10-CM | POA: Diagnosis not present

## 2018-11-06 DIAGNOSIS — K8309 Other cholangitis: Secondary | ICD-10-CM | POA: Diagnosis not present

## 2018-11-06 DIAGNOSIS — D631 Anemia in chronic kidney disease: Secondary | ICD-10-CM | POA: Diagnosis not present

## 2018-11-06 DIAGNOSIS — N186 End stage renal disease: Secondary | ICD-10-CM | POA: Diagnosis not present

## 2018-11-06 IMAGING — CR DG CHEST 2V
2 series · 2 of 2 positions shown · non-contrast
Comparison: 05/18/2017

CLINICAL DATA: Chest pain

EXAM:
CHEST - 2 VIEW

[chest pa]
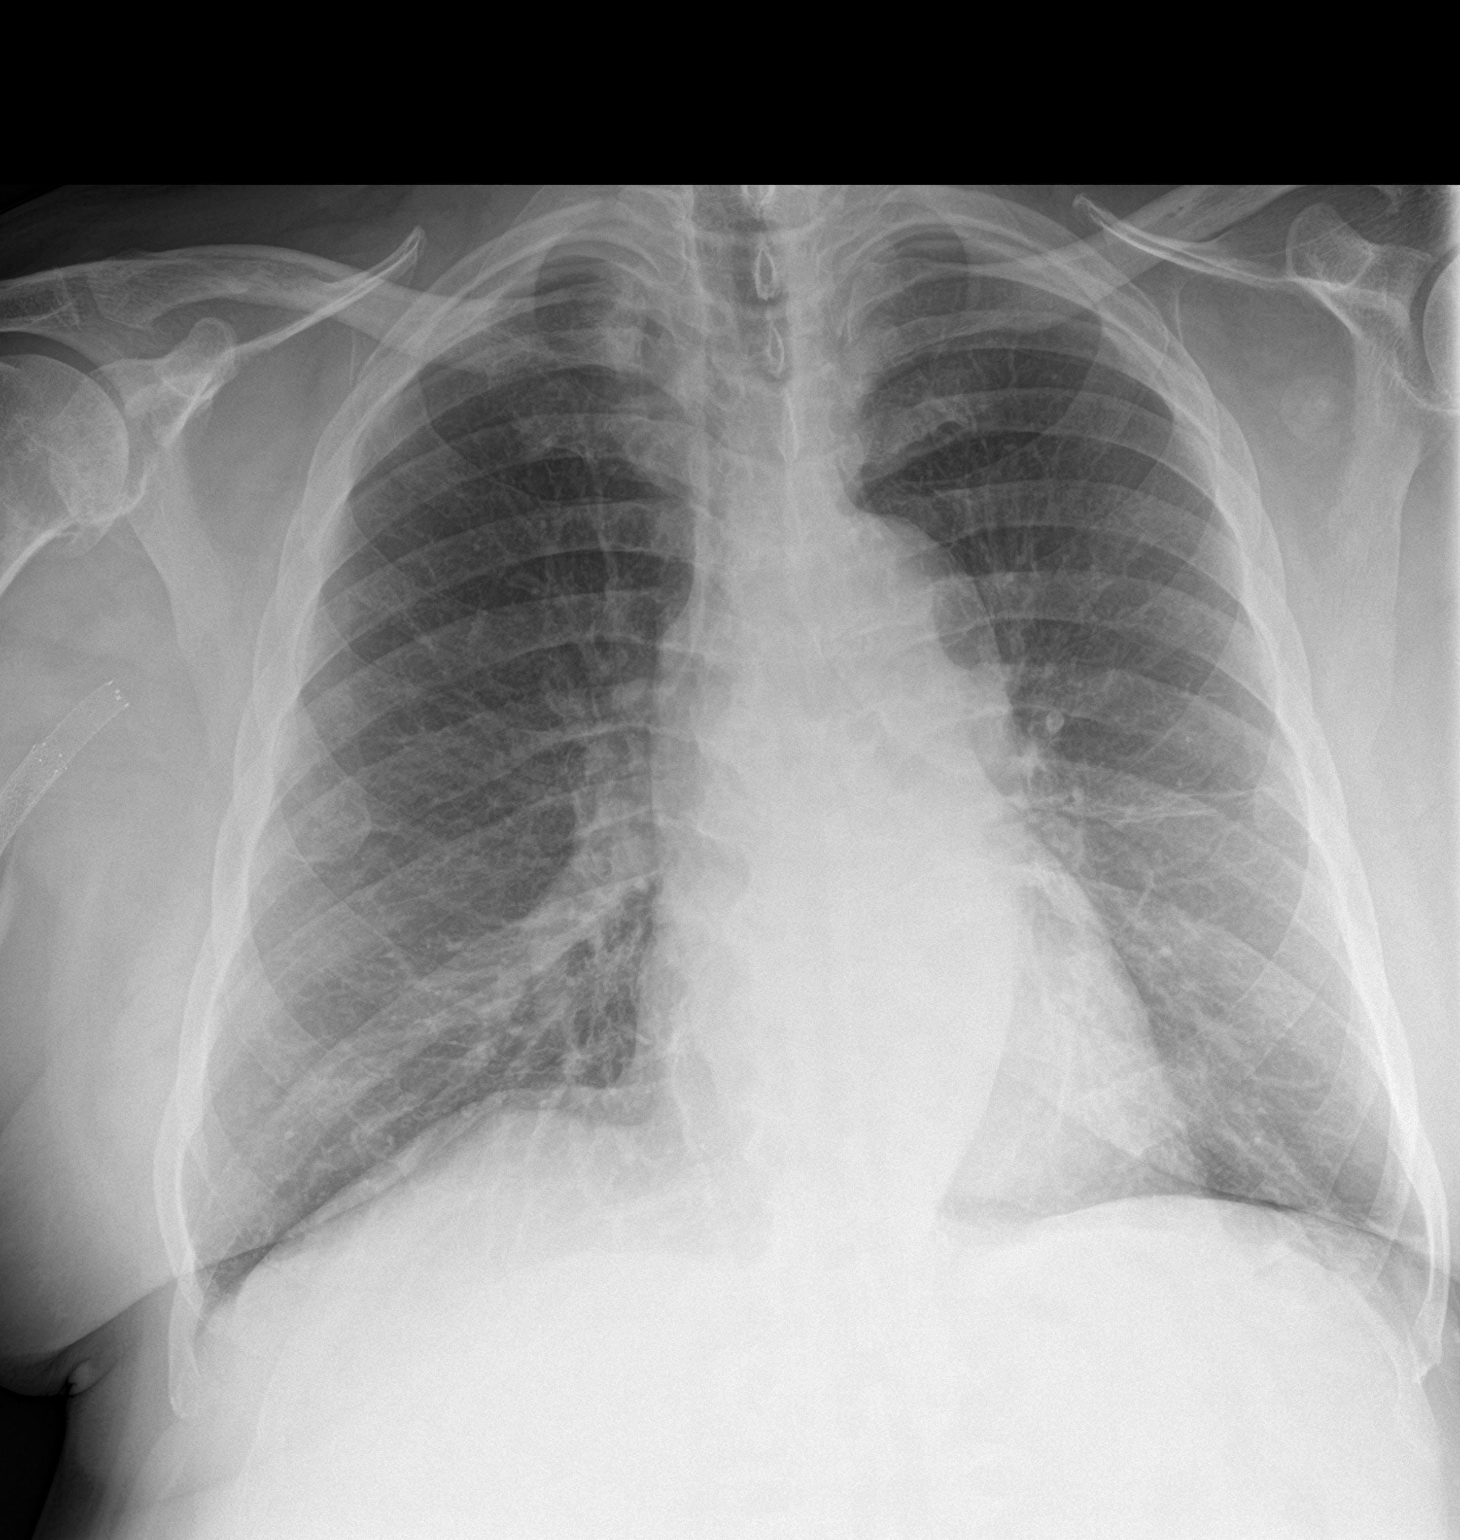

[chest lat]
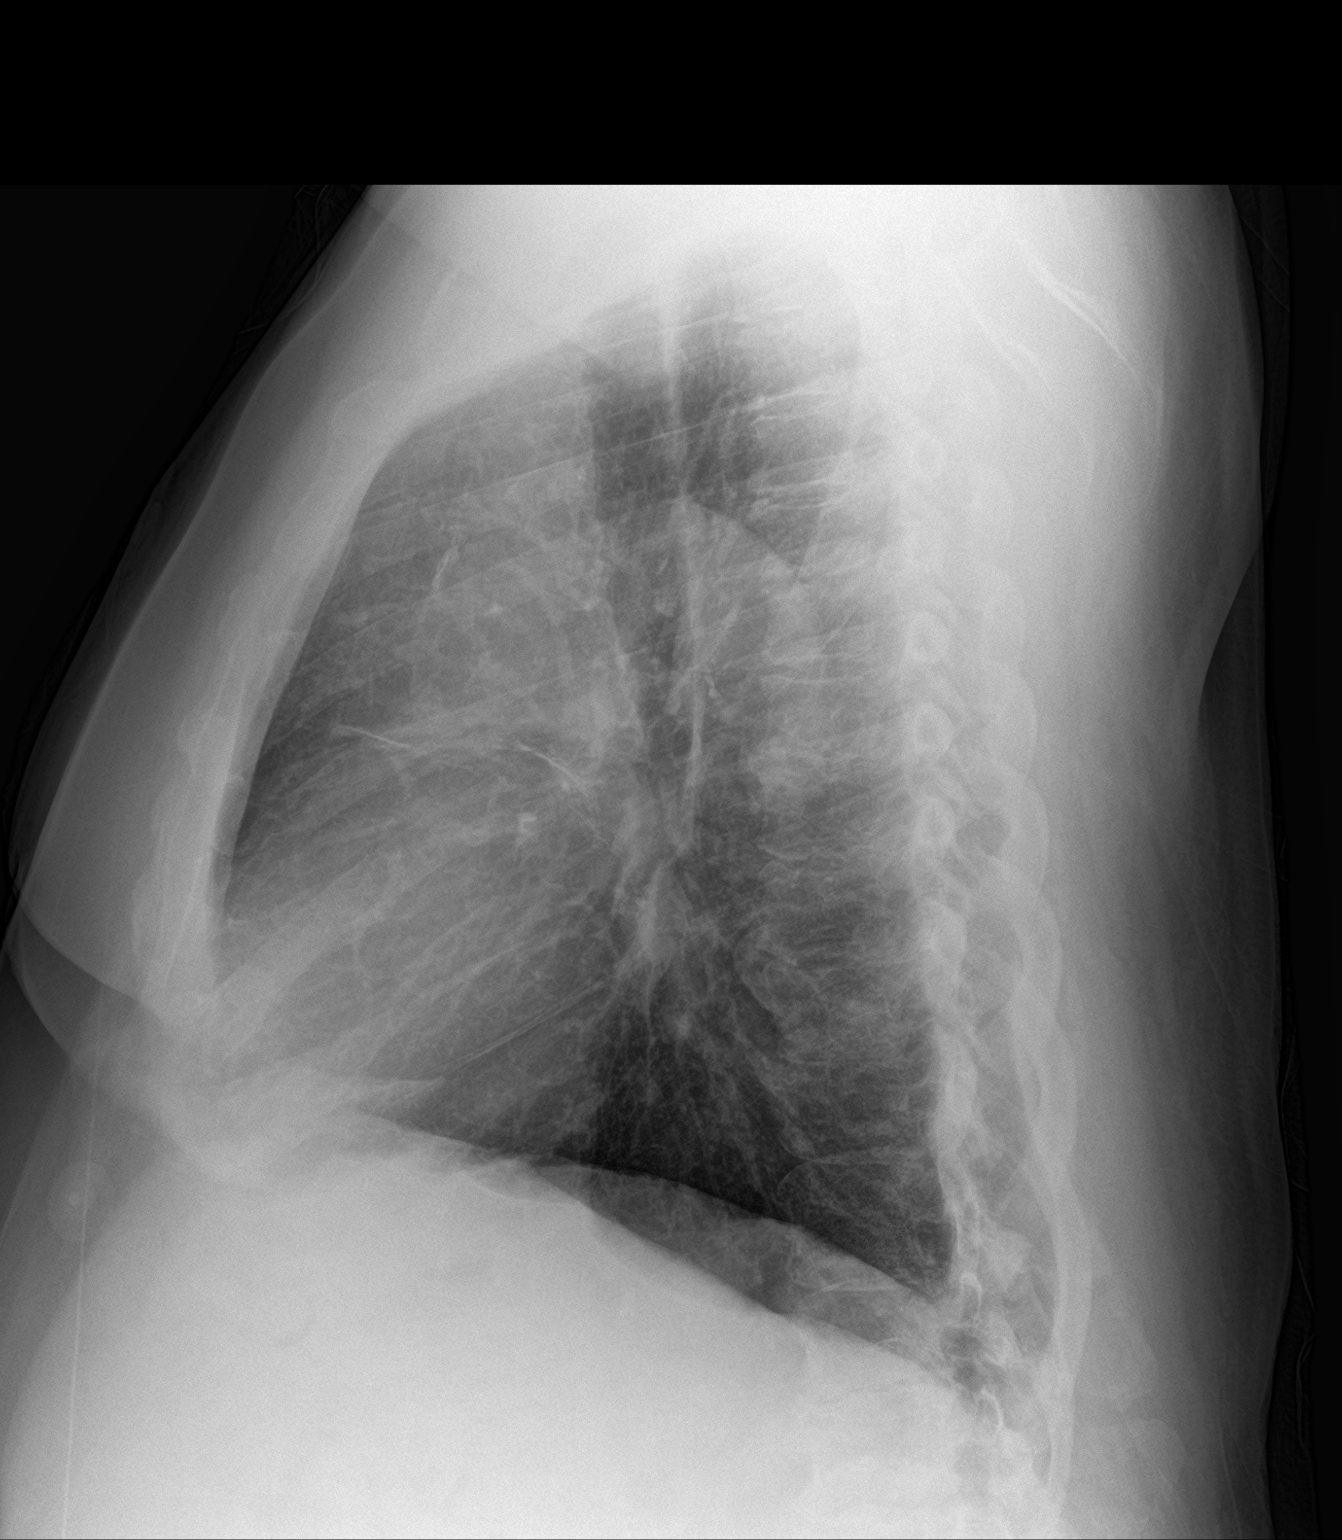

[2 of 2 positions shown; findings below may reference images not displayed]

FINDINGS: Normal heart size. Aortic tortuosity. Left midlung linear opacity is
stable and attributed to scarring. Similar faint linear densities at
the bases. Large lung volumes with diaphragm flattening in the
lateral view. There is no edema, consolidation, effusion, or
pneumothorax. Venous stenting in the right upper extremity.
IMPRESSION: No acute finding when compared to prior.

## 2018-11-07 ENCOUNTER — Other Ambulatory Visit: Payer: Self-pay | Admitting: *Deleted

## 2018-11-07 ENCOUNTER — Other Ambulatory Visit: Payer: Self-pay | Admitting: Internal Medicine

## 2018-11-07 DIAGNOSIS — I5042 Chronic combined systolic (congestive) and diastolic (congestive) heart failure: Secondary | ICD-10-CM | POA: Diagnosis not present

## 2018-11-07 DIAGNOSIS — I5082 Biventricular heart failure: Secondary | ICD-10-CM | POA: Diagnosis not present

## 2018-11-07 DIAGNOSIS — I132 Hypertensive heart and chronic kidney disease with heart failure and with stage 5 chronic kidney disease, or end stage renal disease: Secondary | ICD-10-CM | POA: Diagnosis not present

## 2018-11-07 DIAGNOSIS — E1122 Type 2 diabetes mellitus with diabetic chronic kidney disease: Secondary | ICD-10-CM | POA: Diagnosis not present

## 2018-11-07 DIAGNOSIS — A4150 Gram-negative sepsis, unspecified: Secondary | ICD-10-CM | POA: Diagnosis not present

## 2018-11-07 DIAGNOSIS — A0472 Enterocolitis due to Clostridium difficile, not specified as recurrent: Secondary | ICD-10-CM | POA: Diagnosis not present

## 2018-11-07 NOTE — Patient Outreach (Signed)
[facility]) Care Management  11/07/2018  [name] Dec 08, 1950 [id number]   Call placed to follow up on member's current medical condition, no answer.  HIPAA compliant voice message left.  Will follow up within the next 4 business days.  [name], [place], MSN [place] [phone]

## 2018-11-08 DIAGNOSIS — D631 Anemia in chronic kidney disease: Secondary | ICD-10-CM | POA: Diagnosis not present

## 2018-11-08 DIAGNOSIS — N2581 Secondary hyperparathyroidism of renal origin: Secondary | ICD-10-CM | POA: Diagnosis not present

## 2018-11-08 DIAGNOSIS — N186 End stage renal disease: Secondary | ICD-10-CM | POA: Diagnosis not present

## 2018-11-08 DIAGNOSIS — E119 Type 2 diabetes mellitus without complications: Secondary | ICD-10-CM | POA: Diagnosis not present

## 2018-11-08 DIAGNOSIS — K8309 Other cholangitis: Secondary | ICD-10-CM | POA: Diagnosis not present

## 2018-11-08 DIAGNOSIS — D509 Iron deficiency anemia, unspecified: Secondary | ICD-10-CM | POA: Diagnosis not present

## 2018-11-10 DIAGNOSIS — A4150 Gram-negative sepsis, unspecified: Secondary | ICD-10-CM | POA: Diagnosis not present

## 2018-11-10 DIAGNOSIS — E1122 Type 2 diabetes mellitus with diabetic chronic kidney disease: Secondary | ICD-10-CM | POA: Diagnosis not present

## 2018-11-10 DIAGNOSIS — A0472 Enterocolitis due to Clostridium difficile, not specified as recurrent: Secondary | ICD-10-CM | POA: Diagnosis not present

## 2018-11-10 DIAGNOSIS — I5082 Biventricular heart failure: Secondary | ICD-10-CM | POA: Diagnosis not present

## 2018-11-10 DIAGNOSIS — I5042 Chronic combined systolic (congestive) and diastolic (congestive) heart failure: Secondary | ICD-10-CM | POA: Diagnosis not present

## 2018-11-10 DIAGNOSIS — I132 Hypertensive heart and chronic kidney disease with heart failure and with stage 5 chronic kidney disease, or end stage renal disease: Secondary | ICD-10-CM | POA: Diagnosis not present

## 2018-11-11 ENCOUNTER — Other Ambulatory Visit: Payer: Self-pay

## 2018-11-11 ENCOUNTER — Encounter (HOSPITAL_COMMUNITY): Payer: Self-pay

## 2018-11-11 ENCOUNTER — Inpatient Hospital Stay (HOSPITAL_COMMUNITY)
Admission: EM | Admit: 2018-11-11 | Discharge: 2018-11-16 | DRG: 853 | Disposition: A | Discharge status: Home / Self Care | Payer: Medicare Other | Attending: Internal Medicine | Admitting: Internal Medicine

## 2018-11-11 ENCOUNTER — Emergency Department (HOSPITAL_COMMUNITY): Payer: Medicare Other

## 2018-11-11 DIAGNOSIS — K8050 Calculus of bile duct without cholangitis or cholecystitis without obstruction: Secondary | ICD-10-CM

## 2018-11-11 DIAGNOSIS — Z992 Dependence on renal dialysis: Secondary | ICD-10-CM | POA: Diagnosis not present

## 2018-11-11 DIAGNOSIS — R7881 Bacteremia: Secondary | ICD-10-CM | POA: Diagnosis not present

## 2018-11-11 DIAGNOSIS — T82510A Breakdown (mechanical) of surgically created arteriovenous fistula, initial encounter: Secondary | ICD-10-CM | POA: Diagnosis not present

## 2018-11-11 DIAGNOSIS — N2889 Other specified disorders of kidney and ureter: Secondary | ICD-10-CM | POA: Diagnosis not present

## 2018-11-11 DIAGNOSIS — I770 Arteriovenous fistula, acquired: Secondary | ICD-10-CM | POA: Diagnosis not present

## 2018-11-11 DIAGNOSIS — E039 Hypothyroidism, unspecified: Secondary | ICD-10-CM | POA: Diagnosis present

## 2018-11-11 DIAGNOSIS — D631 Anemia in chronic kidney disease: Secondary | ICD-10-CM | POA: Diagnosis present

## 2018-11-11 DIAGNOSIS — B179 Acute viral hepatitis, unspecified: Secondary | ICD-10-CM | POA: Diagnosis not present

## 2018-11-11 DIAGNOSIS — K8030 Calculus of bile duct with cholangitis, unspecified, without obstruction: Secondary | ICD-10-CM | POA: Diagnosis present

## 2018-11-11 DIAGNOSIS — E119 Type 2 diabetes mellitus without complications: Secondary | ICD-10-CM | POA: Diagnosis not present

## 2018-11-11 DIAGNOSIS — K8020 Calculus of gallbladder without cholecystitis without obstruction: Secondary | ICD-10-CM | POA: Diagnosis not present

## 2018-11-11 DIAGNOSIS — Z8619 Personal history of other infectious and parasitic diseases: Secondary | ICD-10-CM | POA: Diagnosis not present

## 2018-11-11 DIAGNOSIS — A419 Sepsis, unspecified organism: Secondary | ICD-10-CM | POA: Diagnosis not present

## 2018-11-11 DIAGNOSIS — E0590 Thyrotoxicosis, unspecified without thyrotoxic crisis or storm: Secondary | ICD-10-CM | POA: Diagnosis not present

## 2018-11-11 DIAGNOSIS — N2581 Secondary hyperparathyroidism of renal origin: Secondary | ICD-10-CM | POA: Diagnosis present

## 2018-11-11 DIAGNOSIS — K838 Other specified diseases of biliary tract: Secondary | ICD-10-CM | POA: Diagnosis not present

## 2018-11-11 DIAGNOSIS — F0390 Unspecified dementia without behavioral disturbance: Secondary | ICD-10-CM | POA: Diagnosis present

## 2018-11-11 DIAGNOSIS — R932 Abnormal findings on diagnostic imaging of liver and biliary tract: Secondary | ICD-10-CM | POA: Diagnosis not present

## 2018-11-11 DIAGNOSIS — Z7982 Long term (current) use of aspirin: Secondary | ICD-10-CM

## 2018-11-11 DIAGNOSIS — R34 Anuria and oliguria: Secondary | ICD-10-CM | POA: Diagnosis present

## 2018-11-11 DIAGNOSIS — N186 End stage renal disease: Secondary | ICD-10-CM | POA: Diagnosis present

## 2018-11-11 DIAGNOSIS — I729 Aneurysm of unspecified site: Secondary | ICD-10-CM | POA: Diagnosis present

## 2018-11-11 DIAGNOSIS — E0500 Thyrotoxicosis with diffuse goiter without thyrotoxic crisis or storm: Secondary | ICD-10-CM | POA: Diagnosis present

## 2018-11-11 DIAGNOSIS — I5082 Biventricular heart failure: Secondary | ICD-10-CM | POA: Diagnosis not present

## 2018-11-11 DIAGNOSIS — I5042 Chronic combined systolic (congestive) and diastolic (congestive) heart failure: Secondary | ICD-10-CM | POA: Diagnosis present

## 2018-11-11 DIAGNOSIS — I255 Ischemic cardiomyopathy: Secondary | ICD-10-CM | POA: Diagnosis present

## 2018-11-11 DIAGNOSIS — Z96651 Presence of right artificial knee joint: Secondary | ICD-10-CM | POA: Diagnosis present

## 2018-11-11 DIAGNOSIS — E875 Hyperkalemia: Secondary | ICD-10-CM | POA: Diagnosis present

## 2018-11-11 DIAGNOSIS — R6521 Severe sepsis with septic shock: Secondary | ICD-10-CM | POA: Diagnosis not present

## 2018-11-11 DIAGNOSIS — Z87442 Personal history of urinary calculi: Secondary | ICD-10-CM | POA: Diagnosis not present

## 2018-11-11 DIAGNOSIS — F0280 Dementia in other diseases classified elsewhere without behavioral disturbance: Secondary | ICD-10-CM | POA: Diagnosis not present

## 2018-11-11 DIAGNOSIS — M25532 Pain in left wrist: Secondary | ICD-10-CM | POA: Diagnosis not present

## 2018-11-11 DIAGNOSIS — B961 Klebsiella pneumoniae [K. pneumoniae] as the cause of diseases classified elsewhere: Secondary | ICD-10-CM | POA: Diagnosis not present

## 2018-11-11 DIAGNOSIS — Z8719 Personal history of other diseases of the digestive system: Secondary | ICD-10-CM

## 2018-11-11 DIAGNOSIS — G629 Polyneuropathy, unspecified: Secondary | ICD-10-CM | POA: Diagnosis present

## 2018-11-11 DIAGNOSIS — Z9049 Acquired absence of other specified parts of digestive tract: Secondary | ICD-10-CM | POA: Diagnosis not present

## 2018-11-11 DIAGNOSIS — Z792 Long term (current) use of antibiotics: Secondary | ICD-10-CM

## 2018-11-11 DIAGNOSIS — K219 Gastro-esophageal reflux disease without esophagitis: Secondary | ICD-10-CM | POA: Diagnosis present

## 2018-11-11 DIAGNOSIS — L98499 Non-pressure chronic ulcer of skin of other sites with unspecified severity: Secondary | ICD-10-CM | POA: Diagnosis present

## 2018-11-11 DIAGNOSIS — Z8673 Personal history of transient ischemic attack (TIA), and cerebral infarction without residual deficits: Secondary | ICD-10-CM

## 2018-11-11 DIAGNOSIS — I5040 Unspecified combined systolic (congestive) and diastolic (congestive) heart failure: Secondary | ICD-10-CM | POA: Diagnosis not present

## 2018-11-11 DIAGNOSIS — A0472 Enterocolitis due to Clostridium difficile, not specified as recurrent: Secondary | ICD-10-CM | POA: Diagnosis not present

## 2018-11-11 DIAGNOSIS — I509 Heart failure, unspecified: Secondary | ICD-10-CM | POA: Diagnosis not present

## 2018-11-11 DIAGNOSIS — G4730 Sleep apnea, unspecified: Secondary | ICD-10-CM | POA: Diagnosis present

## 2018-11-11 DIAGNOSIS — T82590A Other mechanical complication of surgically created arteriovenous fistula, initial encounter: Secondary | ICD-10-CM | POA: Diagnosis not present

## 2018-11-11 DIAGNOSIS — I132 Hypertensive heart and chronic kidney disease with heart failure and with stage 5 chronic kidney disease, or end stage renal disease: Secondary | ICD-10-CM | POA: Diagnosis present

## 2018-11-11 DIAGNOSIS — K760 Fatty (change of) liver, not elsewhere classified: Secondary | ICD-10-CM | POA: Diagnosis not present

## 2018-11-11 DIAGNOSIS — R531 Weakness: Secondary | ICD-10-CM | POA: Diagnosis not present

## 2018-11-11 DIAGNOSIS — I959 Hypotension, unspecified: Secondary | ICD-10-CM | POA: Diagnosis not present

## 2018-11-11 DIAGNOSIS — Y712 Prosthetic and other implants, materials and accessory cardiovascular devices associated with adverse incidents: Secondary | ICD-10-CM | POA: Diagnosis not present

## 2018-11-11 DIAGNOSIS — E1142 Type 2 diabetes mellitus with diabetic polyneuropathy: Secondary | ICD-10-CM | POA: Diagnosis not present

## 2018-11-11 DIAGNOSIS — R000 Tachycardia, unspecified: Secondary | ICD-10-CM | POA: Diagnosis not present

## 2018-11-11 DIAGNOSIS — E785 Hyperlipidemia, unspecified: Secondary | ICD-10-CM | POA: Diagnosis present

## 2018-11-11 DIAGNOSIS — Z981 Arthrodesis status: Secondary | ICD-10-CM

## 2018-11-11 DIAGNOSIS — E1122 Type 2 diabetes mellitus with diabetic chronic kidney disease: Secondary | ICD-10-CM | POA: Diagnosis present

## 2018-11-11 DIAGNOSIS — T82590D Other mechanical complication of surgically created arteriovenous fistula, subsequent encounter: Secondary | ICD-10-CM | POA: Diagnosis not present

## 2018-11-11 DIAGNOSIS — Z8249 Family history of ischemic heart disease and other diseases of the circulatory system: Secondary | ICD-10-CM

## 2018-11-11 DIAGNOSIS — I252 Old myocardial infarction: Secondary | ICD-10-CM | POA: Diagnosis not present

## 2018-11-11 DIAGNOSIS — Z833 Family history of diabetes mellitus: Secondary | ICD-10-CM

## 2018-11-11 DIAGNOSIS — M109 Gout, unspecified: Secondary | ICD-10-CM | POA: Diagnosis present

## 2018-11-11 DIAGNOSIS — A4150 Gram-negative sepsis, unspecified: Secondary | ICD-10-CM | POA: Diagnosis not present

## 2018-11-11 DIAGNOSIS — F329 Major depressive disorder, single episode, unspecified: Secondary | ICD-10-CM | POA: Diagnosis not present

## 2018-11-11 DIAGNOSIS — E11622 Type 2 diabetes mellitus with other skin ulcer: Secondary | ICD-10-CM | POA: Diagnosis present

## 2018-11-11 DIAGNOSIS — Z881 Allergy status to other antibiotic agents status: Secondary | ICD-10-CM | POA: Diagnosis not present

## 2018-11-11 DIAGNOSIS — R0989 Other specified symptoms and signs involving the circulatory and respiratory systems: Secondary | ICD-10-CM | POA: Diagnosis not present

## 2018-11-11 DIAGNOSIS — E1151 Type 2 diabetes mellitus with diabetic peripheral angiopathy without gangrene: Secondary | ICD-10-CM | POA: Diagnosis not present

## 2018-11-11 DIAGNOSIS — A0471 Enterocolitis due to Clostridium difficile, recurrent: Secondary | ICD-10-CM

## 2018-11-11 DIAGNOSIS — A4159 Other Gram-negative sepsis: Secondary | ICD-10-CM | POA: Diagnosis not present

## 2018-11-11 DIAGNOSIS — Z8546 Personal history of malignant neoplasm of prostate: Secondary | ICD-10-CM | POA: Diagnosis not present

## 2018-11-11 DIAGNOSIS — Z888 Allergy status to other drugs, medicaments and biological substances status: Secondary | ICD-10-CM | POA: Diagnosis not present

## 2018-11-11 DIAGNOSIS — X58XXXD Exposure to other specified factors, subsequent encounter: Secondary | ICD-10-CM | POA: Diagnosis not present

## 2018-11-11 DIAGNOSIS — G4733 Obstructive sleep apnea (adult) (pediatric): Secondary | ICD-10-CM | POA: Diagnosis present

## 2018-11-11 DIAGNOSIS — M48062 Spinal stenosis, lumbar region with neurogenic claudication: Secondary | ICD-10-CM | POA: Diagnosis not present

## 2018-11-11 DIAGNOSIS — I2510 Atherosclerotic heart disease of native coronary artery without angina pectoris: Secondary | ICD-10-CM | POA: Diagnosis not present

## 2018-11-11 DIAGNOSIS — Z955 Presence of coronary angioplasty implant and graft: Secondary | ICD-10-CM

## 2018-11-11 DIAGNOSIS — Z87891 Personal history of nicotine dependence: Secondary | ICD-10-CM

## 2018-11-11 DIAGNOSIS — Z8701 Personal history of pneumonia (recurrent): Secondary | ICD-10-CM | POA: Diagnosis not present

## 2018-11-11 DIAGNOSIS — Z841 Family history of disorders of kidney and ureter: Secondary | ICD-10-CM

## 2018-11-11 DIAGNOSIS — R509 Fever, unspecified: Secondary | ICD-10-CM | POA: Diagnosis not present

## 2018-11-11 DIAGNOSIS — R1310 Dysphagia, unspecified: Secondary | ICD-10-CM | POA: Diagnosis not present

## 2018-11-11 DIAGNOSIS — K311 Adult hypertrophic pyloric stenosis: Secondary | ICD-10-CM | POA: Diagnosis present

## 2018-11-11 DIAGNOSIS — M5412 Radiculopathy, cervical region: Secondary | ICD-10-CM | POA: Diagnosis not present

## 2018-11-11 DIAGNOSIS — J449 Chronic obstructive pulmonary disease, unspecified: Secondary | ICD-10-CM | POA: Diagnosis not present

## 2018-11-11 DIAGNOSIS — D509 Iron deficiency anemia, unspecified: Secondary | ICD-10-CM | POA: Diagnosis not present

## 2018-11-11 DIAGNOSIS — Z9861 Coronary angioplasty status: Secondary | ICD-10-CM | POA: Diagnosis not present

## 2018-11-11 DIAGNOSIS — K8309 Other cholangitis: Secondary | ICD-10-CM | POA: Diagnosis not present

## 2018-11-11 DIAGNOSIS — S91002D Unspecified open wound, left ankle, subsequent encounter: Secondary | ICD-10-CM | POA: Diagnosis not present

## 2018-11-11 DIAGNOSIS — Z923 Personal history of irradiation: Secondary | ICD-10-CM

## 2018-11-11 DIAGNOSIS — T82858A Stenosis of vascular prosthetic devices, implants and grafts, initial encounter: Secondary | ICD-10-CM | POA: Diagnosis not present

## 2018-11-11 DIAGNOSIS — Z79899 Other long term (current) drug therapy: Secondary | ICD-10-CM

## 2018-11-11 DIAGNOSIS — N400 Enlarged prostate without lower urinary tract symptoms: Secondary | ICD-10-CM | POA: Diagnosis not present

## 2018-11-11 LAB — CBC WITH DIFFERENTIAL/PLATELET
Abs Immature Granulocytes: 0.05 10*3/uL (ref 0.00–0.07)
Basophils Absolute: 0 10*3/uL (ref 0.0–0.1)
Basophils Relative: 0 %
Eosinophils Absolute: 0 10*3/uL (ref 0.0–0.5)
Eosinophils Relative: 0 %
HCT: 43 % (ref 39.0–52.0)
Hemoglobin: 13.1 g/dL (ref 13.0–17.0)
Immature Granulocytes: 1 %
Lymphocytes Relative: 4 %
Lymphs Abs: 0.4 10*3/uL — ABNORMAL LOW (ref 0.7–4.0)
MCH: 30.1 pg (ref 26.0–34.0)
MCHC: 30.5 g/dL (ref 30.0–36.0)
MCV: 98.9 fL (ref 80.0–100.0)
Monocytes Absolute: 0.3 10*3/uL (ref 0.1–1.0)
Monocytes Relative: 4 %
Neutro Abs: 7.5 10*3/uL (ref 1.7–7.7)
Neutrophils Relative %: 91 %
Platelets: 233 10*3/uL (ref 150–400)
RBC: 4.35 MIL/uL (ref 4.22–5.81)
RDW: 19.2 % — ABNORMAL HIGH (ref 11.5–15.5)
WBC: 8.2 10*3/uL (ref 4.0–10.5)
nRBC: 0 % (ref 0.0–0.2)

## 2018-11-11 LAB — COMPREHENSIVE METABOLIC PANEL
ALT: 211 U/L — ABNORMAL HIGH (ref 0–44)
AST: 332 U/L — ABNORMAL HIGH (ref 15–41)
Albumin: 3.2 g/dL — ABNORMAL LOW (ref 3.5–5.0)
Alkaline Phosphatase: 181 U/L — ABNORMAL HIGH (ref 38–126)
Anion gap: 16 — ABNORMAL HIGH (ref 5–15)
BUN: 41 mg/dL — ABNORMAL HIGH (ref 8–23)
CO2: 22 mmol/L (ref 22–32)
Calcium: 8.2 mg/dL — ABNORMAL LOW (ref 8.9–10.3)
Chloride: 101 mmol/L (ref 98–111)
Creatinine, Ser: 10.04 mg/dL — ABNORMAL HIGH (ref 0.61–1.24)
GFR calc Af Amer: 5 mL/min — ABNORMAL LOW (ref >60)
GFR calc non Af Amer: 5 mL/min — ABNORMAL LOW (ref >60)
Glucose, Bld: 199 mg/dL — ABNORMAL HIGH (ref 70–99)
Potassium: 5.4 mmol/L — ABNORMAL HIGH (ref 3.5–5.1)
Sodium: 139 mmol/L (ref 135–145)
Total Bilirubin: 1.9 mg/dL — ABNORMAL HIGH (ref 0.3–1.2)
Total Protein: 7.3 g/dL (ref 6.5–8.1)

## 2018-11-11 LAB — LACTIC ACID, PLASMA: Lactic Acid, Venous: 2.7 mmol/L (ref 0.5–1.9)

## 2018-11-11 LAB — INFLUENZA PANEL BY PCR (TYPE A & B)
Influenza A By PCR: NEGATIVE
Influenza B By PCR: NEGATIVE

## 2018-11-11 LAB — GLUCOSE, CAPILLARY: Glucose-Capillary: 127 mg/dL — ABNORMAL HIGH (ref 70–99)

## 2018-11-11 LAB — CBG MONITORING, ED: GLUCOSE-CAPILLARY: 165 mg/dL — AB (ref 70–99)

## 2018-11-11 MED ORDER — SODIUM CHLORIDE 0.9 % IV BOLUS
3000.0000 mL | Freq: Once | INTRAVENOUS | Status: AC
  Administered 2018-11-11: 3000 mL via INTRAVENOUS

## 2018-11-11 MED ORDER — ALLOPURINOL 100 MG PO TABS
100.0000 mg | ORAL_TABLET | Freq: Every day | ORAL | Status: DC
  Administered 2018-11-12 – 2018-11-16 (×4): 100 mg via ORAL
  Filled 2018-11-11 (×5): qty 1

## 2018-11-11 MED ORDER — SODIUM CHLORIDE 0.9 % IV SOLN
250.0000 mL | INTRAVENOUS | Status: DC
  Administered 2018-11-11: 250 mL via INTRAVENOUS
  Administered 2018-11-14: 14:00:00 via INTRAVENOUS

## 2018-11-11 MED ORDER — METHIMAZOLE 5 MG PO TABS
2.5000 mg | ORAL_TABLET | Freq: Every day | ORAL | Status: DC
  Administered 2018-11-12 – 2018-11-16 (×4): 2.5 mg via ORAL
  Filled 2018-11-11 (×6): qty 1

## 2018-11-11 MED ORDER — VANCOMYCIN HCL 10 G IV SOLR
2000.0000 mg | Freq: Once | INTRAVENOUS | Status: AC
  Administered 2018-11-11: 2000 mg via INTRAVENOUS
  Filled 2018-11-11: qty 2000

## 2018-11-11 MED ORDER — SODIUM CHLORIDE 0.9 % IV SOLN
2.0000 g | INTRAVENOUS | Status: DC
  Administered 2018-11-11 – 2018-11-13 (×2): 2 g via INTRAVENOUS
  Filled 2018-11-11 (×4): qty 20

## 2018-11-11 MED ORDER — IOHEXOL 300 MG/ML  SOLN
100.0000 mL | Freq: Once | INTRAMUSCULAR | Status: AC | PRN
  Administered 2018-11-11: 100 mL via INTRAVENOUS

## 2018-11-11 MED ORDER — VANCOMYCIN HCL IN DEXTROSE 1-5 GM/200ML-% IV SOLN: 1000.0000 mg | INTRAVENOUS | Status: DC

## 2018-11-11 MED ORDER — VANCOMYCIN HCL IN DEXTROSE 1-5 GM/200ML-% IV SOLN: 1000.0000 mg | Freq: Once | INTRAVENOUS | Status: DC

## 2018-11-11 MED ORDER — SODIUM CHLORIDE 0.9 % IV SOLN: 2.0000 g | INTRAVENOUS | Status: DC

## 2018-11-11 MED ORDER — HYDROCORTISONE NA SUCCINATE PF 100 MG IJ SOLR
100.0000 mg | Freq: Once | INTRAMUSCULAR | Status: AC
  Administered 2018-11-11: 100 mg via INTRAVENOUS
  Filled 2018-11-11: qty 2

## 2018-11-11 MED ORDER — NOREPINEPHRINE 4 MG/250ML-% IV SOLN
0.0000 ug/min | INTRAVENOUS | Status: DC
  Administered 2018-11-11: 2 ug/min via INTRAVENOUS
  Filled 2018-11-11: qty 250

## 2018-11-11 MED ORDER — MIDODRINE HCL 5 MG PO TABS
5.0000 mg | ORAL_TABLET | Freq: Three times a day (TID) | ORAL | Status: DC
  Administered 2018-11-12 – 2018-11-16 (×12): 5 mg via ORAL
  Filled 2018-11-11 (×13): qty 1

## 2018-11-11 MED ORDER — ACETAMINOPHEN 325 MG PO TABS
650.0000 mg | ORAL_TABLET | Freq: Once | ORAL | Status: AC
  Administered 2018-11-11: 650 mg via ORAL
  Filled 2018-11-11: qty 2

## 2018-11-11 MED ORDER — METRONIDAZOLE IN NACL 5-0.79 MG/ML-% IV SOLN
500.0000 mg | Freq: Three times a day (TID) | INTRAVENOUS | Status: DC
  Administered 2018-11-11: 500 mg via INTRAVENOUS
  Filled 2018-11-11: qty 100

## 2018-11-11 MED ORDER — SODIUM CHLORIDE 0.9 % IV BOLUS
1000.0000 mL | Freq: Once | INTRAVENOUS | Status: AC
  Administered 2018-11-11: 1000 mL via INTRAVENOUS

## 2018-11-11 MED ORDER — HYDROCORTISONE NA SUCCINATE PF 100 MG IJ SOLR
50.0000 mg | Freq: Four times a day (QID) | INTRAMUSCULAR | Status: DC
  Administered 2018-11-12 (×2): 50 mg via INTRAVENOUS
  Filled 2018-11-11 (×2): qty 2

## 2018-11-11 MED ORDER — HEPARIN SODIUM (PORCINE) 5000 UNIT/ML IJ SOLN
5000.0000 [IU] | Freq: Three times a day (TID) | INTRAMUSCULAR | Status: AC
  Administered 2018-11-11 – 2018-11-13 (×5): 5000 [IU] via SUBCUTANEOUS
  Filled 2018-11-11 (×5): qty 1

## 2018-11-11 MED ORDER — INSULIN ASPART 100 UNIT/ML ~~LOC~~ SOLN
0.0000 [IU] | Freq: Three times a day (TID) | SUBCUTANEOUS | Status: DC
  Administered 2018-11-12 – 2018-11-15 (×2): 1 [IU] via SUBCUTANEOUS

## 2018-11-11 MED ORDER — ASPIRIN 81 MG PO CHEW
324.0000 mg | CHEWABLE_TABLET | Freq: Every day | ORAL | Status: DC
  Administered 2018-11-12: 324 mg via ORAL
  Filled 2018-11-11 (×2): qty 4

## 2018-11-11 MED ORDER — SODIUM CHLORIDE 0.9 % IV SOLN
2.0000 g | Freq: Once | INTRAVENOUS | Status: AC
  Administered 2018-11-11: 2 g via INTRAVENOUS
  Filled 2018-11-11: qty 2

## 2018-11-11 MED ORDER — VANCOMYCIN 50 MG/ML ORAL SOLUTION
125.0000 mg | Freq: Two times a day (BID) | ORAL | Status: DC
  Administered 2018-11-11 – 2018-11-16 (×10): 125 mg via ORAL
  Filled 2018-11-11 (×12): qty 2.5

## 2018-11-11 MED ORDER — EZETIMIBE 10 MG PO TABS
10.0000 mg | ORAL_TABLET | Freq: Every day | ORAL | Status: DC
  Administered 2018-11-12 – 2018-11-16 (×4): 10 mg via ORAL
  Filled 2018-11-11 (×5): qty 1

## 2018-11-11 MED ORDER — NOREPINEPHRINE 4 MG/250ML-% IV SOLN: 2.0000 ug/min | INTRAVENOUS | Status: DC

## 2018-11-11 NOTE — ED Notes (Signed)
2 RNs and MD unable to obtain IV access. Order for IV team

## 2018-11-11 NOTE — Progress Notes (Addendum)
Pharmacy Antibiotic Note  [name] is a 68 y.o. male admitted on 11/11/2018 with sepsis.  Pharmacy has been consulted for vancomycin and cefepime dosing.  ESRD on HD TTS. Last session was today on 2/11  Plan: Start cefepime 2g IV QHD-TTS Give vancomycin 2g IV x 1, then start vancomycin 1g IV QHD-TTS  Monitor clinical picture, renal function, vanc levels prn F/U C&S, abx deescalation / LOT  ADDENDUM:  Recent C diff on vancomycin taper. Then looks like got new prescription on 1/28 for vancomycin 125mg  PO BID.   Plan: Stop current abx Start ceftriaxone 2g IV Q24h Start vancomycin 125mg  PO BID F/U ID recs    Temp (24hrs), Avg:102.7 F (39.3 C), Min:102.7 F (39.3 C), Max:102.7 F (39.3 C)  No results for input(s): WBC, CREATININE, LATICACIDVEN, VANCOTROUGH, VANCOPEAK, VANCORANDOM, GENTTROUGH, GENTPEAK, GENTRANDOM, TOBRATROUGH, TOBRAPEAK, TOBRARND, AMIKACINPEAK, AMIKACINTROU, AMIKACIN in the last 168 hours.  CrCl cannot be calculated (Patient's most recent lab result is older than the maximum 21 days allowed.).    Allergies  Allergen Reactions  . Statins Other (See Comments)    Weak muscles  . Ciprofloxacin Rash    Thank you for allowing pharmacy to be a part of this patient's care.  [name] 11/11/2018 3:56 PM

## 2018-11-11 NOTE — Progress Notes (Signed)
CRITICAL VALUE ALERT  Critical Value:  Lactic acid 2.7  Date & Time Notied:  11/11/18 2346  Provider Notified: Warren Lacy

## 2018-11-11 NOTE — ED Notes (Addendum)
Pt states that he is now having chest pressure, captured EKG

## 2018-11-11 NOTE — ED Provider Notes (Signed)
[facility] EMERGENCY DEPARTMENT Provider Note   CSN: [id number] Arrival date & time: 11/11/18  1522     History   Chief Complaint Chief Complaint  Patient presents with  . Altered Mental Status  . Fever    HPI [name] is a 68 y.o. male.  HPI  68 year old male with fever and generalized weakness.  He has a history of recurrent Klebsiella bacteremia of unclear source. Several admissions over the last few months. Yesterday began not feeling well and progressively worsening. Today with chills. Nausea. Abdominal pain. Doesn't localize. Hx of cdiff and reports still on vancomycin. Several loose stools today. Not currently taking keflex. No respiratory complaints. No headache. ESRD and essentially anuric. Did have some HD today but shortened due to illness.   Past Medical History:  Diagnosis Date  . Acute encephalopathy 10/04/2018  . Allergic rhinitis, cause unspecified 02/24/2014  . Anemia 06/16/2011  . BENIGN PROSTATIC HYPERTROPHY 10/14/2009  . CAD, NATIVE VESSEL 02/06/2009   a. 06/2007 s/p Taxus DES to the RCA;  b. 08/2016 NSTEMI in setting of SVT/PCI: LM 30ost, LAD 69m (3.0x16 Synergy DES), LCX 42m, OM1 60, RI 40, RCA 70p/m, 31m - not amenable to PCI.  [name] Cervical radiculopathy, chronic 02/23/2016   Right c5-6 by NCS/EMG  . Chest pain 08/09/2015  . Chronic combined systolic (congestive) and diastolic (congestive) heart failure ([place])    a. 10/2016 Echo: EF 40-45%, Gr2 DD. mildly dil LA.  [name] COLONIC POLYPS, HX OF 10/14/2009  . Dementia ([place]) [id number]  . Depression 09/24/2015  . DIABETES MELLITUS, TYPE II 02/01/2010  . DYSLIPIDEMIA 06/18/2007  . ESRD (end stage renal disease) on dialysis ([place]) 08/04/2010   ESRD due to DM adn HTN, started HD in 2011. As of 2019 has a R arm graft and gets HD on TTS sched  . FOOT PAIN 08/12/2008  . GAIT DISTURBANCE 03/03/2010  . GASTROENTERITIS, VIRAL 10/14/2009  . GERD 06/18/2007  . GOITER, MULTINODULAR 12/26/2007  . GOUT 06/18/2007  .  GYNECOMASTIA 07/17/2010  . Hemodialysis access, fistula mature [facility])    Dialysis T-Th-Sa ([place]) Right upper arm fistula  . Hyperlipidemia 10/16/2011  . Hyperparathyroidism, secondary ([place]) 06/16/2011  . HYPERTENSION 06/18/2007  . Hyperthyroidism   . Hypocalcemia 06/07/2010  . Ischemic cardiomyopathy    a. 10/2016 Echo: EF 40-45%.  . Lumbar stenosis with neurogenic claudication   . ONYCHOMYCOSIS, TOENAILS 12/26/2007  . OSA on CPAP 10/16/2011  . Other malaise and fatigue 11/24/2009  . PERIPHERAL NEUROPATHY 06/18/2007  . Prostate cancer ([place])   . PSVT (paroxysmal supraventricular tachycardia) ([place])    a. 99/3716 complicated by NSTEMI;  b. 11/2016 Treated w/ adenosine in ED;  c. 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  [name] PULMONARY NODULE, RIGHT LOWER LOBE 06/08/2009  . Sleep apnea    cpap machine and o2  . TRANSAMINASES, SERUM, ELEVATED 02/01/2010  . Transfusion history    none recent  . Unspecified hypotension 01/30/2010    Patient Active Problem List   Diagnosis Date Noted  . Cerebral embolism with cerebral infarction 10/05/2018  . Acute hepatitis   . Left wrist pain 09/03/2018  . C. difficile colitis 09/03/2018  . Altered mental status   . Bacteremia due to Klebsiella pneumoniae   . Acute gouty arthritis 08/08/2018  . Sepsis ([place]) 07/16/2018  . Thrush 06/21/2018  . GI bleed 06/11/2018  . Recurrent falls 05/14/2018  . Chronic combined systolic (congestive) and diastolic (congestive) heart failure ([place]) 01/15/2018  . Abscess  of left leg 10/14/2017  . Insomnia 10/14/2017  . Lumbar stenosis with neurogenic claudication 07/31/2017  . Lightheadedness   . Rectal bleeding   . Gait disorder 05/05/2017  . Chronic GI bleeding 04/22/2017  . Malaise and fatigue 12/27/2016  . Generalized weakness 12/27/2016  . History of PSVT (paroxysmal supraventricular tachycardia) 10/16/2016  . Abdominal pain 08/27/2016  . Abnormal findings on diagnostic imaging of liver and biliary tract 08/27/2016    . Bloating symptom 08/27/2016  . Hematochezia 08/27/2016  . Esophageal ulcer 08/27/2016  . Flatulence, eructation and gas pain 08/27/2016  . General weakness 08/27/2016  . Skin sensation disturbance 08/27/2016  . Stricture of esophagus 08/27/2016  . Status post coronary artery stent placement   . ST segment depression 08/05/2016  . PAD (peripheral artery disease) ([place]) 06/18/2016  . Cervical radiculopathy, chronic 02/23/2016  . Memory loss 12/22/2015  . Left-sided weakness 12/22/2015  . Peripheral polyneuropathy 12/22/2015  . DM (diabetes mellitus) type II controlled with renal manifestation ([place]) 10/31/2015  . Nausea and vomiting 10/31/2015  . Malignant neoplasm of prostate ([place]) 10/24/2015  . S/P right total knee replacement 08/09/2015  . Primary osteoarthritis of right knee 08/05/2015  . Neuropathy due to secondary diabetes mellitus ([place]) 08/05/2015  . Hyperthyroidism 03/11/2015  . Nodule of right lung 09/14/2014  . PSA elevation 08/30/2014  . ESRD on hemodialysis ([place]) 08/16/2014  . Essential hypertension 08/16/2014  . Encounter for hemodialysis ([place]) 08/16/2014  . Allergic rhinitis 02/24/2014  . Vertigo 10/14/2013  . Cough 06/26/2013  . Numbness 05/15/2013  . Other complications due to renal dialysis device, implant, and graft 01/14/2013  . Right hand pain 03/28/2012  . Dysphagia 03/28/2012  . Hypogonadism, male 03/28/2012  . OSA (obstructive sleep apnea) 10/16/2011  . HLD (hyperlipidemia) 10/16/2011  . CAD S/P percutaneous coronary angioplasty 09/28/2011  . NSTEMI (non-ST elevated myocardial infarction) ([place]) 09/28/2011  . AVM (arteriovenous malformation) 09/27/2011  . Anemia associated with acute blood loss 09/26/2011  . Ischemic cardiomyopathy 06/16/2011  . Hyperparathyroidism, secondary ([place]) 06/16/2011  . Preventative health care 03/11/2011  . NECK PAIN 07/31/2010  . GYNECOMASTIA 07/17/2010  . DIZZINESS 07/17/2010  . GAIT DISTURBANCE 03/03/2010  .  Thyrotoxicosis 02/02/2010  . Elevated levels of transaminase & lactic acid dehydrogenase 02/01/2010  . Other malaise and fatigue 11/24/2009  . Depression 10/14/2009  . BENIGN PROSTATIC HYPERTROPHY 10/14/2009  . COLONIC POLYPS, HX OF 10/14/2009  . Dementia ([place]) 09/02/2009  . PULMONARY NODULE, RIGHT LOWER LOBE 06/08/2009  . DYSPNEA 10/29/2008  . ONYCHOMYCOSIS, TOENAILS 12/26/2007  . GOITER, MULTINODULAR 12/26/2007  . Gout 06/18/2007  . Neuropathy ([place]) 06/18/2007  . Hypertensive heart disease with CHF (congestive heart failure) ([place]) 06/18/2007  . Gastro-esophageal reflux disease without esophagitis 06/18/2007    Past Surgical History:  Procedure Laterality Date  . A/V SHUNTOGRAM N/A 04/07/2018   Procedure: A/V SHUNTOGRAM;  Surgeon: Waynetta Sandy, MD;  Location: [facility] LAB;  Service: Cardiovascular;  Laterality: N/A;  . ARTERIOVENOUS GRAFT PLACEMENT Right 2009   forearm/notes 02/01/2011  . AV FISTULA PLACEMENT  11/07/2011   Procedure: INSERTION OF ARTERIOVENOUS (AV) GORE-TEX GRAFT ARM;  Surgeon: Tinnie Gens, MD;  Location: [place];  Service: Vascular;  Laterality: Left;  . BACK SURGERY  1998  . BASCILIC VEIN TRANSPOSITION Right 02/27/2013   Procedure: BASCILIC VEIN TRANSPOSITION;  Surgeon: Mal Misty, MD;  Location: [facility];  Service: Vascular;  Laterality: Right;  Right Basilic Vein Transposition   . CARDIAC CATHETERIZATION N/A 08/06/2016   Procedure: Left  Heart Cath and Coronary Angiography;  Surgeon: Jolaine Artist, MD;  Location: [facility] LAB;  Service: Cardiovascular;  Laterality: N/A;  . CARDIAC CATHETERIZATION N/A 08/07/2016   Procedure: Coronary/Graft Atherectomy-CSI LAD;  Surgeon: Peter M Martinique, MD;  Location: [facility];  Service: Cardiovascular;  Laterality: N/A;  . CERVICAL SPINE SURGERY  2/09   "to repair nerve problems in my left arm"  . CHOLECYSTECTOMY    . COLONOSCOPY WITH PROPOFOL N/A 04/26/2017   Procedure: COLONOSCOPY WITH PROPOFOL;   Surgeon: Otis Brace, MD;  Location: [facility];  Service: Gastroenterology;  Laterality: N/A;  . CORONARY ANGIOPLASTY WITH STENT PLACEMENT  06/11/2008  . CORONARY ANGIOPLASTY WITH STENT PLACEMENT  06/2007   TAXUS stent to RCA/notes 01/31/2011  . ESOPHAGOGASTRODUODENOSCOPY  09/28/2011   Procedure: ESOPHAGOGASTRODUODENOSCOPY (EGD);  Surgeon: Missy Sabins, MD;  Location: [facility] ENDOSCOPY;  Service: Endoscopy;  Laterality: N/A;  . ESOPHAGOGASTRODUODENOSCOPY N/A 04/07/2015   Procedure: ESOPHAGOGASTRODUODENOSCOPY (EGD);  Surgeon: Teena Irani, MD;  Location: [facility];  Service: Endoscopy;  Laterality: N/A;  . ESOPHAGOGASTRODUODENOSCOPY N/A 04/19/2015   Procedure: ESOPHAGOGASTRODUODENOSCOPY (EGD);  Surgeon: Arta Silence, MD;  Location: [facility] ENDOSCOPY;  Service: Endoscopy;  Laterality: N/A;  . ESOPHAGOGASTRODUODENOSCOPY (EGD) WITH PROPOFOL N/A 06/13/2018   Procedure: ESOPHAGOGASTRODUODENOSCOPY (EGD) WITH PROPOFOL;  Surgeon: Wonda Horner, MD;  Location: [facility] ENDOSCOPY;  Service: Endoscopy;  Laterality: N/A;  . FLEXIBLE SIGMOIDOSCOPY N/A 05/21/2017   Procedure: Conni Elliot;  Surgeon: Clarene Essex, MD;  Location: [facility];  Service: Endoscopy;  Laterality: N/A;  . FLEXIBLE SIGMOIDOSCOPY Left 07/02/2017   Procedure: FLEXIBLE SIGMOIDOSCOPY;  Surgeon: Laurence Spates, MD;  Location: [facility];  Service: Endoscopy;  Laterality: Left;  . FOREIGN BODY REMOVAL  09/2003   via upper endoscopy/notes 02/12/2011  . GIVENS CAPSULE STUDY  09/30/2011   Procedure: GIVENS CAPSULE STUDY;  Surgeon: Jeryl Columbia, MD;  Location: [facility] ENDOSCOPY;  Service: Endoscopy;  Laterality: N/A;  . INSERTION OF DIALYSIS CATHETER Right 2014  . INSERTION OF DIALYSIS CATHETER Left 02/11/2013   Procedure: INSERTION OF DIALYSIS CATHETER;  Surgeon: Conrad Waynesboro, MD;  Location: [facility];  Service: Vascular;  Laterality: Left;  Ultrasound guided  . LUMBAR LAMINECTOMY/DECOMPRESSION MICRODISCECTOMY Bilateral 07/31/2017   Procedure:  LAMINECTOMY AND FORAMINOTOMY- BILATERAL LUMBAR TWO- LUMBAR THREE;  Surgeon: Earnie Larsson, MD;  Location: [facility];  Service: Neurosurgery;  Laterality: Bilateral;  LAMINECTOMY AND FORAMINOTOMY- BILATERAL LUMBAR 2- LUMBAR 3  . PERIPHERAL VASCULAR BALLOON ANGIOPLASTY  04/07/2018   Procedure: PERIPHERAL VASCULAR BALLOON ANGIOPLASTY;  Surgeon: Waynetta Sandy, MD;  Location: [place] [facility] LAB;  Service: Cardiovascular;;  RUE AVF  . REMOVAL OF A DIALYSIS CATHETER Right 02/11/2013   Procedure: REMOVAL OF A DIALYSIS CATHETER;  Surgeon: Conrad Davenport, MD;  Location: [facility];  Service: Vascular;  Laterality: Right;  . SAVORY DILATION N/A 04/07/2015   Procedure: SAVORY DILATION;  Surgeon: Teena Irani, MD;  Location: [facility] ENDOSCOPY;  Service: Endoscopy;  Laterality: N/A;  . SHUNTOGRAM N/A 09/20/2011   Procedure: Earney Mallet;  Surgeon: Conrad Escobares, MD;  Location: [facility] CATH LAB;  Service: Cardiovascular;  Laterality: N/A;  . SVT ABLATION N/A 11/26/2016   Procedure: SVT Ablation;  Surgeon: Will Meredith Leeds, MD;  Location: [place] CV LAB;  Service: Cardiovascular;  Laterality: N/A;  . TEE WITHOUT CARDIOVERSION N/A 08/25/2018   Procedure: TRANSESOPHAGEAL ECHOCARDIOGRAM (TEE);  Surgeon: Dorothy Spark, MD;  Location: [facility] ENDOSCOPY;  Service: Cardiovascular;  Laterality: N/A;  . TONSILLECTOMY    . TOTAL KNEE  ARTHROPLASTY Right 08/02/2015   Procedure: TOTAL KNEE ARTHROPLASTY;  Surgeon: Renette Butters, MD;  Location: [facility];  Service: Orthopedics;  Laterality: Right;  . VENOGRAM N/A 01/26/2013   Procedure: VENOGRAM;  Surgeon: Angelia Mould, MD;  Location: [facility] CATH LAB;  Service: Cardiovascular;  Laterality: N/A;        Home Medications    Prior to Admission medications   Medication Sig Start Date End Date Taking? Authorizing Provider  acetaminophen (TYLENOL) 325 MG tablet Take 650 mg by mouth every 6 (six) hours as needed for mild pain or headache.     [provider]  Alirocumab (PRALUENT)  150 MG/ML SOPN Inject 150 mg into the skin every 14 (fourteen) days. 03/05/18   Lorretta Harp, MD  allopurinol (ZYLOPRIM) 100 MG tablet TAKE ONE TABLET BY MOUTH ONCE DAILY Patient taking differently: Take 100 mg by mouth daily.  10/14/17   Biagio Borg, MD  aspirin EC 325 MG tablet Take 1 tablet (325 mg total) by mouth daily. 10/10/18   Hongalgi, Lenis Dickinson, MD  betamethasone dipropionate (DIPROLENE) 0.05 % cream Apply 1 application topically daily as needed (leg sores).    [provider]  citalopram (CELEXA) 10 MG tablet Take 1 tablet (10 mg total) by mouth at bedtime. 10/14/17   Biagio Borg, MD  colchicine 0.6 MG tablet Take 1 tablet (0.6 mg total) by mouth daily. 08/08/18   Biagio Borg, MD  doxercalciferol ([name]) 4 MCG/2ML injection Inject 1 mL (2 mcg total) into the vein Every Tuesday,Thursday,and Saturday with dialysis. 09/02/18   Kayleen Memos, DO  ezetimibe (ZETIA) 10 MG tablet TAKE ONE TABLET BY MOUTH ONCE DAILY Patient taking differently: Take 10 mg by mouth daily.  04/08/17   Bensimhon, Shaune Pascal, MD  ferric citrate (AURYXIA) 1 GM 210 MG(Fe) tablet Take 2 tablets (420 mg total) by mouth 3 (three) times daily with meals. 08/30/18   Kayleen Memos, DO  gabapentin (NEURONTIN) 100 MG capsule Take 1 capsule (100 mg total) by mouth at bedtime. 08/31/18   Kayleen Memos, DO  hydroxypropyl methylcellulose / hypromellose (ISOPTO TEARS / GONIOVISC) 2.5 % ophthalmic solution Place 1 drop into both eyes 3 (three) times daily as needed for dry eyes. 05/03/17   Biagio Borg, MD  methimazole (TAPAZOLE) 5 MG tablet Take 0.5 tablets (2.5 mg total) by mouth daily. 05/16/18   Renato Shin, MD  multivitamin (RENA-VIT) TABS tablet Take 1 tablet by mouth daily. Reported on 10/24/2015    [provider]  Nutritional Supplements (FEEDING SUPPLEMENT, NEPRO CARB STEADY,) LIQD Take 237 mLs by mouth 2 (two) times daily between meals. 08/30/18   Kayleen Memos, DO  polyethylene glycol (MIRALAX /  GLYCOLAX) packet Take 17 g by mouth daily. 05/23/17   Florencia Reasons, MD  SENSIPAR 90 MG tablet Take 90 mg by mouth 2 (two) times daily. 09/18/16   [provider]  vancomycin (VANCOCIN) 125 MG capsule Take 1 capsule (125 mg total) by mouth 2 (two) times daily. 10/28/18   Indios Callas, NP  WELCHOL 625 MG tablet TAKE 3 TABLETS BY MOUTH TWICE DAILY WITH MEALS 11/07/18   Biagio Borg, MD  zolpidem (AMBIEN) 5 MG tablet Take 1 tablet (5 mg total) by mouth at bedtime as needed. for sleep Patient taking differently: Take 5 mg by mouth at bedtime as needed for sleep.  06/20/18   Biagio Borg, MD    Family History Family History  Problem Relation  Age of Onset  . Heart disease Sister   . Thyroid nodules Sister   . Heart disease Father   . Diabetes Father   . Kidney failure Father   . Hypertension Father   . Healthy Child   . Healthy Child   . Healthy Child   . Cancer Neg Hx     Social History Social History   Tobacco Use  . Smoking status: Former Smoker    Packs/day: 1.00    Years: 25.00    Pack years: 25.00    Types: Cigarettes    Last attempt to quit: 10/01/2005    Years since quitting: 13.1  . Smokeless tobacco: Former Systems developer    Quit date: 10/01/2005  . Tobacco comment: Quit smoking 2007 Smoked x 25 years 1/2 ppd.  Substance Use Topics  . Alcohol use: No    Alcohol/week: 0.0 standard drinks  . Drug use: No     Allergies   Statins and Ciprofloxacin   Review of Systems Review of Systems  All systems reviewed and negative, other than as noted in HPI.  Physical Exam Updated Vital Signs BP (!) 89/54 Comment: Simultaneous filing. User may not have seen previous data.  Pulse (!) 111   Temp (!) 102.7 F (39.3 C)   Resp 20   SpO2 93%   Physical Exam Vitals signs and nursing note reviewed.  Constitutional:      Appearance: He is well-developed. He is ill-appearing.  HENT:     Head: Normocephalic and atraumatic.  Eyes:     General:        Right eye: No discharge.         Left eye: No discharge.     Conjunctiva/sclera: Conjunctivae normal.  Neck:     Musculoskeletal: Neck supple.  Cardiovascular:     Rate and Rhythm: Regular rhythm. Tachycardia present.     Heart sounds: Normal heart sounds. No murmur. No friction rub. No gallop.   Pulmonary:     Effort: Pulmonary effort is normal. No respiratory distress.     Breath sounds: Normal breath sounds.  Abdominal:     General: There is no distension.     Palpations: Abdomen is soft.     Tenderness: There is abdominal tenderness.     Comments: Mild ttp lower abdomen/LLQ. No rebound or guarding.   Musculoskeletal:        General: No tenderness.  Skin:    General: Skin is warm and dry.     Coloration: Pallor: .critica.  Neurological:     General: No focal deficit present.     Mental Status: He is alert.     Cranial Nerves: No cranial nerve deficit.     Comments: Drowsy but follows commands and answering questions appropriately  Psychiatric:        Behavior: Behavior normal.        Thought Content: Thought content normal.      ED Treatments / Results  Labs (all labs ordered are listed, but only abnormal results are displayed) Labs Reviewed  LACTIC ACID, PLASMA - Abnormal; Notable for the following components:      Result Value   Lactic Acid, Venous 2.7 (*)    All other components within normal limits  COMPREHENSIVE METABOLIC PANEL - Abnormal; Notable for the following components:   Potassium 5.4 (*)    Glucose, Bld 199 (*)    BUN 41 (*)    Creatinine, Ser 10.04 (*)    Calcium 8.2 (*)  Albumin 3.2 (*)    AST 332 (*)    ALT 211 (*)    Alkaline Phosphatase 181 (*)    Total Bilirubin 1.9 (*)    GFR calc non Af Amer 5 (*)    GFR calc Af Amer 5 (*)    Anion gap 16 (*)    All other components within normal limits  CBC WITH DIFFERENTIAL/PLATELET - Abnormal; Notable for the following components:   RDW 19.2 (*)    Lymphs Abs 0.4 (*)    All other components within normal limits  CBC -  Abnormal; Notable for the following components:   WBC 18.7 (*)    RBC 3.88 (*)    Hemoglobin 11.4 (*)    HCT 37.4 (*)    RDW 19.2 (*)    All other components within normal limits  BASIC METABOLIC PANEL - Abnormal; Notable for the following components:   Potassium 6.2 (*)    Glucose, Bld 212 (*)    BUN 43 (*)    Creatinine, Ser 9.73 (*)    Calcium 7.8 (*)    GFR calc non Af Amer 5 (*)    GFR calc Af Amer 6 (*)    All other components within normal limits  HEPATIC FUNCTION PANEL - Abnormal; Notable for the following components:   Albumin 2.8 (*)    AST 224 (*)    ALT 230 (*)    Alkaline Phosphatase 170 (*)    Total Bilirubin 2.4 (*)    Bilirubin, Direct 1.6 (*)    All other components within normal limits  GLUCOSE, CAPILLARY - Abnormal; Notable for the following components:   Glucose-Capillary 127 (*)    All other components within normal limits  GLUCOSE, CAPILLARY - Abnormal; Notable for the following components:   Glucose-Capillary 132 (*)    All other components within normal limits  GLUCOSE, CAPILLARY - Abnormal; Notable for the following components:   Glucose-Capillary 103 (*)    All other components within normal limits  CBG MONITORING, ED - Abnormal; Notable for the following components:   Glucose-Capillary 165 (*)    All other components within normal limits  MRSA PCR SCREENING  CULTURE, BLOOD (ROUTINE X 2)  CULTURE, BLOOD (ROUTINE X 2)  C DIFFICILE QUICK SCREEN W PCR REFLEX  INFLUENZA PANEL BY PCR (TYPE A & B)  MAGNESIUM  PHOSPHORUS  TSH  CORTISOL  LACTIC ACID, PLASMA    EKG EKG Interpretation  Date/Time:  Tuesday November 11 2018 16:08:21 EST Ventricular Rate:  107 PR Interval:    QRS Duration: 105 QT Interval:  330 QTC Calculation: 441 R Axis:   63 Text Interpretation:  Sinus tachycardia Multiform ventricular premature complexes Nonspecific repol abnormality, lateral leads Confirmed by Virgel Manifold [phone]) on 11/11/2018 4:51:47  PM   Radiology Dg Chest Port 1 View  Result Date: 11/11/2018 CLINICAL DATA:  Fever and altered mental status EXAM: PORTABLE CHEST 1 VIEW COMPARISON:  October 03, 2018 FINDINGS: The heart size and mediastinal contours are stable. The aorta is tortuous. There is mild central pulmonary vascular congestion. There is no frank pulmonary edema, focal pneumonia or pleural effusion. The visualized skeletal structures are stable. IMPRESSION: Mild central pulmonary vascular congestion. Electronically Signed   By: Abelardo Diesel M.D.   On: 11/11/2018 16:08    Procedures Procedures (including critical care time)  CRITICAL CARE Performed by: Virgel Manifold Total critical care time: 40 minutes Critical care time was exclusive of separately billable procedures and treating other patients. Critical  care was necessary to treat or prevent imminent or life-threatening deterioration. Critical care was time spent personally by me on the following activities: development of treatment plan with patient and/or surrogate as well as nursing, discussions with consultants, evaluation of patient's response to treatment, examination of patient, obtaining history from patient or surrogate, ordering and performing treatments and interventions, ordering and review of laboratory studies, ordering and review of radiographic studies, pulse oximetry and re-evaluation of patient's condition.    Medications Ordered in ED Medications  ceFEPIme (MAXIPIME) 2 g in sodium chloride 0.9 % 100 mL IVPB (has no administration in time range)  metroNIDAZOLE (FLAGYL) IVPB 500 mg (has no administration in time range)  vancomycin (VANCOCIN) 2,000 mg in sodium chloride 0.9 % 500 mL IVPB (has no administration in time range)  acetaminophen (TYLENOL) tablet 650 mg (650 mg Oral Given 11/11/18 1603)     Initial Impression / Assessment and Plan / ED Course  I have reviewed the triage vital signs and the nursing notes.  Pertinent labs & imaging  results that were available during my care of the patient were reviewed by me and considered in my medical decision making (see chart for details).    I have reviewed the triage vital signs and the nursing notes. Prior records were reviewed for additional information.    Pertinent labs & imaging results that were available during my care of the patient were reviewed by me and considered in my medical decision making (see chart for details).  67yM with fever, generalized weakness and abdominal pain. Recent recurrent klebsiella bacteremia of unclear source and also cdiff. Hard to obtain access and labs. Not ideal, but made decision to start abx prior to blood cultures because of the hypotension. It is responding to IVF. Some TTP lower abdomen. Will obtain CT, particularly with recent hx of cdiff.   Hypotension. Initially responding to IVF but dropping again. Additional IVF ordered. Levophed. CCM consulted.   Final Clinical Impressions(s) / ED Diagnoses   Final diagnoses:  Severe sepsis with septic shock [facility])    ED Discharge Orders    None       Virgel Manifold, MD 11/12/18 1313

## 2018-11-11 NOTE — ED Notes (Addendum)
Unable to obtain blood cultures prior to antibiotics being started. MD notified.

## 2018-11-11 NOTE — H&P (Signed)
NAME:  [name], MRN:  [id number], DOB:  Feb 08, 1951, LOS: 0 ADMISSION DATE:  11/11/2018, CONSULTATION DATE:  11/11/18 REFERRING MD:  Wilson Singer  CHIEF COMPLAINT:  Weakness   Brief History   [name] is a 68 y.o. male who was admitted 2/11 with septic shock presumed due to recurrent klebsiella bacteremia.  He has a hx of recurrent klebsiella bacteremia of unclear source despite being followed / worked up by ID extensively (including negative TEE/R AVF US/L wrist aspiration).  Also has hx of C.diff colitis.  He remained mildly hypotensive despite 3.5L NS bolus and was subsequently started on levophed; therefore, PCCM was called for ICU admission.  History of present illness   [name] is a 68 y.o. male who has a PMH as outlined below including but not limited to ESRD on HD TTS and multiple recent admissions for recurrent klebsiella bacteremia with unclear source despite extensive workup (see "past medical history" for rest).  He presented to [facility] ED 2/11 from dialysis due to weakness and "not being himself" per his wife.  He had recent admission 10/03/18 through 10/09/18 where he was again found to have recurrent klebsiella bacteremia (dating back to Oct 2019) and was treated with IV ceftriaxone initially which was then changed to PO keflex x 14 days.  Per wife, he recently went from 2 pills of Keflex to 1 pill daily along with PO vanc for his C.diff, and he was supposed to have follow up with ID on 11/12/18.  Per wife, on 2/11 after HD session, he just did not seem like himself.  He was brought to ED where he was found to have fever to 102 and hypotension with SBP in the 70s.  He remained hypotensive despite 3.5L NS; therefore, he was started on norepinephrine.  He states earlier in the day, he had some abdomen pain but this has since resolved.  He also experienced chills throughout the day but did not feel like he had a fever.  Denies any lightheadedness, chest pain, dyspnea, cough, N/V, myalgias.  Has  mild occasional diarrhea; though, has hx of C.diff colitis for which he is still on PO vanc as noted above.  Past Medical History  ESRD on HD TTS, recurrent klebsiella bacteremia, C.diff colitis, HoTN, ICM, HLD, OSA on CPAP, prostate CA s/p radiotherapy, peripheral neuropathy, gout, DM, hyperthyroidism, CVA, depression, colonic polyps.  Significant Hospital Events   2/11 > admit.  Consults:  Nephrology. Infectious Disease.  Procedures:  None.  Significant Diagnostic Tests:  CT A / P 2/11 > no acute process, fatty infiltration of the liver.  Micro Data:  Blood 2/11 >   Antimicrobials:  Ceftriaxone 2/11 >  Vanc PO 2/11 >  Vanc IV x 1 in ED 2/11   Interim history/subjective:  Comfortable.  States "I feel pretty good right now".  Objective:  Blood pressure 109/64, pulse 97, temperature 99.5 F (37.5 C), temperature source Oral, resp. rate 20, height 6\' 1"  (1.854 m), weight 97.5 kg, SpO2 97 %.        Intake/Output Summary (Last 24 hours) at 11/11/2018 2118 Last data filed at 11/11/2018 1901 Gross per 24 hour  Intake 3200 ml  Output 1 ml  Net 3199 ml   Filed Weights   11/11/18 1707  Weight: 97.5 kg    Examination: General: Adult male, in NAD. Neuro: A&O x 3, no deficits. HEENT: [place]/AT. Sclerae anicteric.  EOMI.  MMM. Cardiovascular: RRR, no M/R/G.  Lungs: Respirations even and unlabored.  CTA bilaterally, No W/R/R.  Abdomen: Obese. BS x 4, soft, NT/ND.  Musculoskeletal: RUE graft.  No gross deformities, no edema.  Skin: Intact, warm, no rashes.  Assessment & Plan:   Septic shock - presumed recurrent klebsiella bacteremia +/- C.diff (multipel admissions for this in the past with no clear source identified despite extensive workup including negative TEE/R AVF US/L wrist aspiration.  Is followed by ID). - Restart IV ceftriaxone per previous admissions (managed by ID). - ID consult in AM. - Follow cultures. - Continue levophed as needed for goal MAP > 65. - Assess  cortisol then start empiric steroids, d/c if cortisol > 20. - Start midodrine, consider continue on discharge.  Hx C.diff colitis. - Continue PO vanc. - ID consult in AM.  ESRD on HD TTS (last session Tues 2/11). - Nephrology consult in AM.  Transaminitis. - Trend LFT's.  Hx ICM, mixed CHF (echo from Jan 2020 with EF 45-50%, G1DD) HLD. - Continue preadmission ASA, zetia.  OSA on CPAP. - Continue nocturnal CPAP.  Hx hyperthyroidism. - Assess TSH. - Continue preadmission methimazole.  Hx DM. - SSI.  Hx gout. - Continue preadmission allopurinol.  Hx CVA, depression, peripheral neuropathy. - Continue preadmission ASA. - Hold preadmission citalopram, gabapentin.   Best Practice:  Diet: Renal. Pain/Anxiety/Delirium protocol (if indicated): N/A. VAP protocol (if indicated): N/A. DVT prophylaxis: SCD's / Heparin. GI prophylaxis: N/A. Glucose control: SSI. Mobility: Bedrest. Code Status: Full. Family Communication: Wife updated at bedside. Disposition: ICU.  Labs   CBC: Recent Labs  Lab 11/11/18 1701  WBC 8.2  NEUTROABS 7.5  HGB 13.1  HCT 43.0  MCV 98.9  PLT 245   Basic Metabolic Panel: Recent Labs  Lab 11/11/18 1701  NA 139  K 5.4*  CL 101  CO2 22  GLUCOSE 199*  BUN 41*  CREATININE 10.04*  CALCIUM 8.2*   GFR: Estimated Creatinine Clearance: 8.5 mL/min (A) (by C-G formula based on SCr of 10.04 mg/dL (H)). Recent Labs  Lab 11/11/18 1701  WBC 8.2   Liver Function Tests: Recent Labs  Lab 11/11/18 1701  AST 332*  ALT 211*  ALKPHOS 181*  BILITOT 1.9*  PROT 7.3  ALBUMIN 3.2*   No results for input(s): LIPASE, AMYLASE in the last 168 hours. No results for input(s): AMMONIA in the last 168 hours. ABG    Component Value Date/Time   PHART 7.410 08/20/2018 1113   PCO2ART 32.7 08/20/2018 1113   PO2ART 82.3 (L) 08/20/2018 1113   HCO3 19.7 (L) 08/20/2018 1113   TCO2 28 08/19/2018 1716   ACIDBASEDEF 3.7 (H) 08/20/2018 1113   O2SAT 94.3  08/20/2018 1113    Coagulation Profile: No results for input(s): INR, PROTIME in the last 168 hours. Cardiac Enzymes: No results for input(s): CKTOTAL, CKMB, CKMBINDEX, TROPONINI in the last 168 hours. HbA1C: Hgb A1c MFr Bld  Date/Time Value Ref Range Status  10/04/2018 11:05 AM 6.0 (H) 4.8 - 5.6 % Final    Comment:    (NOTE) Pre diabetes:          5.7%-6.4% Diabetes:              >6.4% Glycemic control for   <7.0% adults with diabetes   07/16/2018 10:35 PM 5.7 (H) 4.8 - 5.6 % Final    Comment:    (NOTE) Pre diabetes:          5.7%-6.4% Diabetes:              >6.4% Glycemic control for   <  7.0% adults with diabetes    CBG: Recent Labs  Lab 11/11/18 1542  GLUCAP 165*    Review of Systems:   All negative; except for those that are bolded, which indicate positives.  Constitutional: weight loss, weight gain, night sweats, fevers, chills, fatigue, weakness.  HEENT: headaches, sore throat, sneezing, nasal congestion, post nasal drip, difficulty swallowing, tooth/dental problems, visual complaints, visual changes, ear aches. Neuro: difficulty with speech, weakness, numbness, ataxia. CV:  chest pain, orthopnea, PND, swelling in lower extremities, dizziness, palpitations, syncope.  Resp: cough, hemoptysis, dyspnea, wheezing. GI: heartburn, indigestion, abdominal pain, nausea, vomiting, diarrhea, constipation, change in bowel habits, loss of appetite, hematemesis, melena, hematochezia.  GU: dysuria, change in color of urine, urgency or frequency, flank pain, hematuria. MSK: joint pain or swelling, decreased range of motion. Psych: change in mood or affect, depression, anxiety, suicidal ideations, homicidal ideations. Skin: rash, itching, bruising.   Past medical history  He,  has a past medical history of Acute encephalopathy (10/04/2018), Allergic rhinitis, cause unspecified (02/24/2014), Anemia (06/16/2011), BENIGN PROSTATIC HYPERTROPHY (10/14/2009), CAD, NATIVE VESSEL  (02/06/2009), Cervical radiculopathy, chronic (02/23/2016), Chest pain (08/09/2015), Chronic combined systolic (congestive) and diastolic (congestive) heart failure ([place]), COLONIC POLYPS, HX OF (10/14/2009), Dementia ([place]) ([id number]), Depression (09/24/2015), DIABETES MELLITUS, TYPE II (02/01/2010), DYSLIPIDEMIA (06/18/2007), ESRD (end stage renal disease) on dialysis ([place]) (08/04/2010), FOOT PAIN (08/12/2008), GAIT DISTURBANCE (03/03/2010), GASTROENTERITIS, VIRAL (10/14/2009), GERD (06/18/2007), GOITER, MULTINODULAR (12/26/2007), GOUT (06/18/2007), GYNECOMASTIA (07/17/2010), Hemodialysis access, fistula mature ([place]), Hyperlipidemia (10/16/2011), Hyperparathyroidism, secondary ([place]) (06/16/2011), HYPERTENSION (06/18/2007), Hyperthyroidism, Hypocalcemia (06/07/2010), Ischemic cardiomyopathy, Lumbar stenosis with neurogenic claudication, ONYCHOMYCOSIS, TOENAILS (12/26/2007), OSA on CPAP (10/16/2011), Other malaise and fatigue (11/24/2009), PERIPHERAL NEUROPATHY (06/18/2007), Prostate cancer ([place]), PSVT (paroxysmal supraventricular tachycardia) ([place]), PULMONARY NODULE, RIGHT LOWER LOBE (06/08/2009), Sleep apnea, TRANSAMINASES, SERUM, ELEVATED (02/01/2010), Transfusion history, and Unspecified hypotension (01/30/2010).   Surgical History    Past Surgical History:  Procedure Laterality Date  . A/V SHUNTOGRAM N/A 04/07/2018   Procedure: A/V SHUNTOGRAM;  Surgeon: Waynetta Sandy, MD;  Location: [place] [facility];  Service: Cardiovascular;  Laterality: N/A;  . ARTERIOVENOUS GRAFT PLACEMENT Right 2009   forearm/notes 02/01/2011  . AV FISTULA PLACEMENT  11/07/2011   Procedure: INSERTION OF ARTERIOVENOUS (AV) GORE-TEX GRAFT ARM;  Surgeon: Tinnie Gens, MD;  Location: [place];  Service: Vascular;  Laterality: Left;  . BACK SURGERY  1998  . BASCILIC VEIN TRANSPOSITION Right 02/27/2013   Procedure: BASCILIC VEIN TRANSPOSITION;  Surgeon: Mal Misty, MD;  Location: [place];  Service: Vascular;  Laterality: Right;  Right Basilic Vein  Transposition   . CARDIAC CATHETERIZATION N/A 08/06/2016   Procedure: Left Heart Cath and Coronary Angiography;  Surgeon: Jolaine Artist, MD;  Location: [place] [facility];  Service: Cardiovascular;  Laterality: N/A;  . CARDIAC CATHETERIZATION N/A 08/07/2016   Procedure: Coronary/Graft Atherectomy-CSI LAD;  Surgeon: Peter M Martinique, MD;  Location: [place] [facility];  Service: Cardiovascular;  Laterality: N/A;  . CERVICAL SPINE SURGERY  2/09   "to repair nerve problems in my left arm"  . CHOLECYSTECTOMY    . COLONOSCOPY WITH PROPOFOL N/A 04/26/2017   Procedure: COLONOSCOPY WITH PROPOFOL;  Surgeon: Otis Brace, MD;  Location: [place];  Service: Gastroenterology;  Laterality: N/A;  . CORONARY ANGIOPLASTY WITH STENT PLACEMENT  06/11/2008  . CORONARY ANGIOPLASTY WITH STENT PLACEMENT  06/2007   TAXUS stent to RCA/notes 01/31/2011  . ESOPHAGOGASTRODUODENOSCOPY  09/28/2011   Procedure: ESOPHAGOGASTRODUODENOSCOPY (EGD);  Surgeon: Missy Sabins, MD;  Location: [facility] ENDOSCOPY;  Service: Endoscopy;  Laterality: N/A;  . ESOPHAGOGASTRODUODENOSCOPY N/A 04/07/2015   Procedure: ESOPHAGOGASTRODUODENOSCOPY (EGD);  Surgeon: Teena Irani, MD;  Location: [facility];  Service: Endoscopy;  Laterality: N/A;  . ESOPHAGOGASTRODUODENOSCOPY N/A 04/19/2015   Procedure: ESOPHAGOGASTRODUODENOSCOPY (EGD);  Surgeon: Arta Silence, MD;  Location: [facility] ENDOSCOPY;  Service: Endoscopy;  Laterality: N/A;  . ESOPHAGOGASTRODUODENOSCOPY (EGD) WITH PROPOFOL N/A 06/13/2018   Procedure: ESOPHAGOGASTRODUODENOSCOPY (EGD) WITH PROPOFOL;  Surgeon: Wonda Horner, MD;  Location: [facility] ENDOSCOPY;  Service: Endoscopy;  Laterality: N/A;  . FLEXIBLE SIGMOIDOSCOPY N/A 05/21/2017   Procedure: Conni Elliot;  Surgeon: Clarene Essex, MD;  Location: [place];  Service: Endoscopy;  Laterality: N/A;  . FLEXIBLE SIGMOIDOSCOPY Left 07/02/2017   Procedure: FLEXIBLE SIGMOIDOSCOPY;  Surgeon: Laurence Spates, MD;  Location: [place];  Service:  Endoscopy;  Laterality: Left;  . FOREIGN BODY REMOVAL  09/2003   via upper endoscopy/notes 02/12/2011  . GIVENS CAPSULE STUDY  09/30/2011   Procedure: GIVENS CAPSULE STUDY;  Surgeon: Jeryl Columbia, MD;  Location: [facility] ENDOSCOPY;  Service: Endoscopy;  Laterality: N/A;  . INSERTION OF DIALYSIS CATHETER Right 2014  . INSERTION OF DIALYSIS CATHETER Left 02/11/2013   Procedure: INSERTION OF DIALYSIS CATHETER;  Surgeon: Conrad [place], MD;  Location: [place];  Service: Vascular;  Laterality: Left;  Ultrasound guided  . LUMBAR LAMINECTOMY/DECOMPRESSION MICRODISCECTOMY Bilateral 07/31/2017   Procedure: LAMINECTOMY AND FORAMINOTOMY- BILATERAL LUMBAR TWO- LUMBAR THREE;  Surgeon: Earnie Larsson, MD;  Location: [place];  Service: Neurosurgery;  Laterality: Bilateral;  LAMINECTOMY AND FORAMINOTOMY- BILATERAL LUMBAR 2- LUMBAR 3  . PERIPHERAL VASCULAR BALLOON ANGIOPLASTY  04/07/2018   Procedure: PERIPHERAL VASCULAR BALLOON ANGIOPLASTY;  Surgeon: Waynetta Sandy, MD;  Location: [place] [facility];  Service: Cardiovascular;;  RUE AVF  . REMOVAL OF A DIALYSIS CATHETER Right 02/11/2013   Procedure: REMOVAL OF A DIALYSIS CATHETER;  Surgeon: Conrad [place], MD;  Location: [place];  Service: Vascular;  Laterality: Right;  . SAVORY DILATION N/A 04/07/2015   Procedure: SAVORY DILATION;  Surgeon: Teena Irani, MD;  Location: [facility] ENDOSCOPY;  Service: Endoscopy;  Laterality: N/A;  . SHUNTOGRAM N/A 09/20/2011   Procedure: [place];  Surgeon: Conrad [place], MD;  Location: [facility] CATH LAB;  Service: Cardiovascular;  Laterality: N/A;  . SVT ABLATION N/A 11/26/2016   Procedure: SVT Ablation;  Surgeon: Will Meredith Leeds, MD;  Location: [place] [facility];  Service: Cardiovascular;  Laterality: N/A;  . TEE WITHOUT CARDIOVERSION N/A 08/25/2018   Procedure: TRANSESOPHAGEAL ECHOCARDIOGRAM (TEE);  Surgeon: Dorothy Spark, MD;  Location: [facility] ENDOSCOPY;  Service: Cardiovascular;  Laterality: N/A;  . TONSILLECTOMY    . TOTAL KNEE ARTHROPLASTY Right  08/02/2015   Procedure: TOTAL KNEE ARTHROPLASTY;  Surgeon: Renette Butters, MD;  Location: [place];  Service: Orthopedics;  Laterality: Right;  . VENOGRAM N/A 01/26/2013   Procedure: VENOGRAM;  Surgeon: Angelia Mould, MD;  Location: [facility] CATH LAB;  Service: Cardiovascular;  Laterality: N/A;     Social History   reports that he quit smoking about 13 years ago. His smoking use included cigarettes. He has a 25.00 pack-year smoking history. He quit smokeless tobacco use about 13 years ago. He reports that he does not drink alcohol or use drugs.   Family history   His family history includes Diabetes in his father; Healthy in his child, child, and child; Heart disease in his father and sister; Hypertension in his father; Kidney failure in his father; Thyroid nodules in his sister. There is no history of Cancer.   Allergies Allergies  Allergen Reactions  . Statins Other (See Comments)    Weak muscles  . Ciprofloxacin Rash     Home meds  Prior to Admission medications   Medication Sig Start Date End Date Taking? Authorizing Provider  acetaminophen (TYLENOL) 325 MG tablet Take 650 mg by mouth every 6 (six) hours as needed for mild pain or headache.    Yes [provider]  acetaminophen-codeine (TYLENOL #3) 300-30 MG tablet Take 1-2 tablets by mouth 4 (four) times daily as needed. 11/05/18  Yes [provider]  Alirocumab (PRALUENT) 150 MG/ML SOPN Inject 150 mg into the skin every 14 (fourteen) days. 03/05/18  Yes Lorretta Harp, MD  allopurinol (ZYLOPRIM) 100 MG tablet TAKE ONE TABLET BY MOUTH ONCE DAILY Patient taking differently: Take 100 mg by mouth daily.  10/14/17  Yes Biagio Borg, MD  aspirin EC 325 MG tablet Take 1 tablet (325 mg total) by mouth daily. 10/10/18  Yes Hongalgi, Lenis Dickinson, MD  betamethasone dipropionate (DIPROLENE) 0.05 % cream Apply 1 application topically daily as needed (leg sores).   Yes [provider]  citalopram (CELEXA) 10 MG tablet Take 1  tablet (10 mg total) by mouth at bedtime. 10/14/17  Yes Biagio Borg, MD  colchicine 0.6 MG tablet Take 1 tablet (0.6 mg total) by mouth daily. 08/08/18  Yes Biagio Borg, MD  doxercalciferol ([name]) 4 MCG/2ML injection Inject 1 mL (2 mcg total) into the vein Every Tuesday,Thursday,and Saturday with dialysis. 09/02/18  Yes Hall, Archie Patten N, DO  ezetimibe (ZETIA) 10 MG tablet TAKE ONE TABLET BY MOUTH ONCE DAILY Patient taking differently: Take 10 mg by mouth daily.  04/08/17  Yes Bensimhon, Shaune Pascal, MD  ferric citrate (AURYXIA) 1 GM 210 MG(Fe) tablet Take 2 tablets (420 mg total) by mouth 3 (three) times daily with meals. 08/30/18  Yes Irene Pap N, DO  gabapentin (NEURONTIN) 100 MG capsule Take 1 capsule (100 mg total) by mouth at bedtime. 08/31/18  Yes Kayleen Memos, DO  hydroxypropyl methylcellulose / hypromellose (ISOPTO TEARS / GONIOVISC) 2.5 % ophthalmic solution Place 1 drop into both eyes 3 (three) times daily as needed for dry eyes. 05/03/17  Yes Biagio Borg, MD  methimazole (TAPAZOLE) 5 MG tablet Take 0.5 tablets (2.5 mg total) by mouth daily. 05/16/18  Yes Renato Shin, MD  multivitamin (RENA-VIT) TABS tablet Take 1 tablet by mouth daily.    Yes [provider]  Nutritional Supplements (FEEDING SUPPLEMENT, NEPRO CARB STEADY,) LIQD Take 237 mLs by mouth 2 (two) times daily between meals. 08/30/18  Yes Hall, Carole N, DO  pantoprazole (PROTONIX) 40 MG tablet Take 40 mg by mouth at bedtime.  11/03/18  Yes [provider]  polyethylene glycol (MIRALAX / GLYCOLAX) packet Take 17 g by mouth daily. Patient taking differently: Take 17 g by mouth daily as needed for mild constipation or moderate constipation.  05/23/17  Yes Florencia Reasons, MD  SENSIPAR 90 MG tablet Take 90 mg by mouth 2 (two) times daily. 09/18/16  Yes [provider]  vancomycin (VANCOCIN) 125 MG capsule Take 1 capsule (125 mg total) by mouth 2 (two) times daily. 10/28/18  Yes Groveton Callas, NP  WELCHOL 625  MG tablet TAKE 3 TABLETS BY MOUTH TWICE DAILY WITH MEALS Patient taking differently: Take 1,875 mg by mouth 2 (two) times daily with a meal.  11/07/18  Yes Biagio Borg, MD  zolpidem (AMBIEN) 5 MG tablet Take 1 tablet (5 mg total) by mouth at  bedtime as needed. for sleep Patient taking differently: Take 5 mg by mouth at bedtime as needed for sleep.  06/20/18  Yes Biagio Borg, MD    Critical care time: 40 min.    Montey Hora, [facility] Pager: [phone].  If no answer, [phone] 11/11/2018, 9:18 PM

## 2018-11-11 NOTE — ED Triage Notes (Signed)
Pt arrives POV for AMS. pt recieves dialysis T TH SAT. Pt arousable by voice, hot to touch. Per wife pt has c.o feeling ill for the past couple days.

## 2018-11-11 NOTE — ED Notes (Signed)
CCM at bedside 

## 2018-11-11 NOTE — ED Notes (Signed)
IV team at bedside. Pt no longer makes urine/UA will not be able to be collected.

## 2018-11-12 ENCOUNTER — Other Ambulatory Visit: Payer: Self-pay | Admitting: *Deleted

## 2018-11-12 ENCOUNTER — Ambulatory Visit: Payer: Medicare Other | Admitting: Internal Medicine

## 2018-11-12 ENCOUNTER — Encounter (HOSPITAL_COMMUNITY): Payer: Self-pay | Admitting: Internal Medicine

## 2018-11-12 DIAGNOSIS — R531 Weakness: Secondary | ICD-10-CM

## 2018-11-12 DIAGNOSIS — Z992 Dependence on renal dialysis: Secondary | ICD-10-CM

## 2018-11-12 DIAGNOSIS — M25532 Pain in left wrist: Secondary | ICD-10-CM

## 2018-11-12 DIAGNOSIS — Z888 Allergy status to other drugs, medicaments and biological substances status: Secondary | ICD-10-CM

## 2018-11-12 DIAGNOSIS — Z8546 Personal history of malignant neoplasm of prostate: Secondary | ICD-10-CM

## 2018-11-12 DIAGNOSIS — Z881 Allergy status to other antibiotic agents status: Secondary | ICD-10-CM

## 2018-11-12 DIAGNOSIS — Z87891 Personal history of nicotine dependence: Secondary | ICD-10-CM

## 2018-11-12 DIAGNOSIS — N186 End stage renal disease: Secondary | ICD-10-CM

## 2018-11-12 DIAGNOSIS — E1122 Type 2 diabetes mellitus with diabetic chronic kidney disease: Secondary | ICD-10-CM

## 2018-11-12 DIAGNOSIS — S91002D Unspecified open wound, left ankle, subsequent encounter: Secondary | ICD-10-CM

## 2018-11-12 DIAGNOSIS — R6521 Severe sepsis with septic shock: Secondary | ICD-10-CM

## 2018-11-12 DIAGNOSIS — I5040 Unspecified combined systolic (congestive) and diastolic (congestive) heart failure: Secondary | ICD-10-CM

## 2018-11-12 DIAGNOSIS — Z8619 Personal history of other infectious and parasitic diseases: Secondary | ICD-10-CM

## 2018-11-12 DIAGNOSIS — Z9049 Acquired absence of other specified parts of digestive tract: Secondary | ICD-10-CM

## 2018-11-12 DIAGNOSIS — A419 Sepsis, unspecified organism: Secondary | ICD-10-CM

## 2018-11-12 DIAGNOSIS — F0390 Unspecified dementia without behavioral disturbance: Secondary | ICD-10-CM

## 2018-11-12 DIAGNOSIS — R509 Fever, unspecified: Secondary | ICD-10-CM

## 2018-11-12 DIAGNOSIS — X58XXXD Exposure to other specified factors, subsequent encounter: Secondary | ICD-10-CM

## 2018-11-12 LAB — BASIC METABOLIC PANEL
Anion gap: 9 (ref 5–15)
Anion gap: 14 (ref 5–15)
BUN: 43 mg/dL — ABNORMAL HIGH (ref 8–23)
BUN: 52 mg/dL — ABNORMAL HIGH (ref 8–23)
CHLORIDE: 105 mmol/L (ref 98–111)
CO2: 24 mmol/L (ref 22–32)
CO2: 21 mmol/L — ABNORMAL LOW (ref 22–32)
Calcium: 7.8 mg/dL — ABNORMAL LOW (ref 8.9–10.3)
Calcium: 8.3 mg/dL — ABNORMAL LOW (ref 8.9–10.3)
Chloride: 106 mmol/L (ref 98–111)
Creatinine, Ser: 9.73 mg/dL — ABNORMAL HIGH (ref 0.61–1.24)
Creatinine, Ser: 10.77 mg/dL — ABNORMAL HIGH (ref 0.61–1.24)
GFR calc Af Amer: 6 mL/min — ABNORMAL LOW (ref >60)
GFR calc Af Amer: 5 mL/min — ABNORMAL LOW (ref >60)
GFR calc non Af Amer: 5 mL/min — ABNORMAL LOW (ref >60)
GFR calc non Af Amer: 4 mL/min — ABNORMAL LOW (ref >60)
Glucose, Bld: 212 mg/dL — ABNORMAL HIGH (ref 70–99)
Glucose, Bld: 111 mg/dL — ABNORMAL HIGH (ref 70–99)
POTASSIUM: 6.2 mmol/L — AB (ref 3.5–5.1)
Potassium: 5.5 mmol/L — ABNORMAL HIGH (ref 3.5–5.1)
Sodium: 138 mmol/L (ref 135–145)
Sodium: 141 mmol/L (ref 135–145)

## 2018-11-12 LAB — HEPATIC FUNCTION PANEL
ALT: 230 U/L — ABNORMAL HIGH (ref 0–44)
AST: 224 U/L — AB (ref 15–41)
Albumin: 2.8 g/dL — ABNORMAL LOW (ref 3.5–5.0)
Alkaline Phosphatase: 170 U/L — ABNORMAL HIGH (ref 38–126)
Bilirubin, Direct: 1.6 mg/dL — ABNORMAL HIGH (ref 0.0–0.2)
Indirect Bilirubin: 0.8 mg/dL (ref 0.3–0.9)
Total Bilirubin: 2.4 mg/dL — ABNORMAL HIGH (ref 0.3–1.2)
Total Protein: 6.6 g/dL (ref 6.5–8.1)

## 2018-11-12 LAB — CBC
HCT: 37.4 % — ABNORMAL LOW (ref 39.0–52.0)
Hemoglobin: 11.4 g/dL — ABNORMAL LOW (ref 13.0–17.0)
MCH: 29.4 pg (ref 26.0–34.0)
MCHC: 30.5 g/dL (ref 30.0–36.0)
MCV: 96.4 fL (ref 80.0–100.0)
Platelets: 216 10*3/uL (ref 150–400)
RBC: 3.88 MIL/uL — AB (ref 4.22–5.81)
RDW: 19.2 % — ABNORMAL HIGH (ref 11.5–15.5)
WBC: 18.7 10*3/uL — ABNORMAL HIGH (ref 4.0–10.5)
nRBC: 0 % (ref 0.0–0.2)

## 2018-11-12 LAB — GLUCOSE, CAPILLARY
Glucose-Capillary: 132 mg/dL — ABNORMAL HIGH (ref 70–99)
Glucose-Capillary: 103 mg/dL — ABNORMAL HIGH (ref 70–99)
Glucose-Capillary: 113 mg/dL — ABNORMAL HIGH (ref 70–99)
Glucose-Capillary: 133 mg/dL — ABNORMAL HIGH (ref 70–99)

## 2018-11-12 LAB — MAGNESIUM: Magnesium: 1.9 mg/dL (ref 1.7–2.4)

## 2018-11-12 LAB — LACTIC ACID, PLASMA: Lactic Acid, Venous: 1.5 mmol/L (ref 0.5–1.9)

## 2018-11-12 LAB — CORTISOL: Cortisol, Plasma: 75.2 ug/dL

## 2018-11-12 LAB — C DIFFICILE QUICK SCREEN W PCR REFLEX
C Diff antigen: NEGATIVE
C Diff interpretation: NOT DETECTED
C Diff toxin: NEGATIVE

## 2018-11-12 LAB — PHOSPHORUS: Phosphorus: 3.4 mg/dL (ref 2.5–4.6)

## 2018-11-12 LAB — MRSA PCR SCREENING: MRSA by PCR: NEGATIVE

## 2018-11-12 LAB — TSH: TSH: 0.978 u[IU]/mL (ref 0.350–4.500)

## 2018-11-12 MED ORDER — CHLORHEXIDINE GLUCONATE CLOTH 2 % EX PADS
6.0000 | MEDICATED_PAD | Freq: Every day | CUTANEOUS | Status: DC
  Administered 2018-11-13 – 2018-11-16 (×3): 6 via TOPICAL

## 2018-11-12 MED ORDER — HYDROCORTISONE NA SUCCINATE PF 100 MG IJ SOLR
50.0000 mg | Freq: Three times a day (TID) | INTRAMUSCULAR | Status: DC
  Administered 2018-11-12 – 2018-11-13 (×5): 50 mg via INTRAVENOUS
  Filled 2018-11-12 (×5): qty 2

## 2018-11-12 MED ORDER — SODIUM ZIRCONIUM CYCLOSILICATE 10 G PO PACK
10.0000 g | PACK | Freq: Once | ORAL | Status: AC
  Administered 2018-11-12: 10 g via ORAL
  Filled 2018-11-12 (×2): qty 1

## 2018-11-12 MED ORDER — GABAPENTIN 100 MG PO CAPS
100.0000 mg | ORAL_CAPSULE | Freq: Every day | ORAL | Status: DC
  Administered 2018-11-12: 100 mg via ORAL
  Filled 2018-11-12: qty 1

## 2018-11-12 NOTE — Progress Notes (Signed)
eLink Physician-Brief Progress Note Patient Name: [name] DOB: [birth year] MRN: [id number]   Date of Service  11/12/2018  HPI/Events of Note  68 y/o M recent Klebsiella bacteremia, ESRD on HD via graft did not complete dialysis today due to hypotension requiring norepinephrine after 3.5 liters of fluids.  eICU Interventions  Continue broad spectrum antibiotics History of autonomic dysfunction on midodrine as well as steroids     Intervention Category Major Interventions: Shock - evaluation and management Evaluation Type: New Patient Evaluation  Judd Lien 11/12/2018, 12:43 AM

## 2018-11-12 NOTE — Progress Notes (Addendum)
NAME:  [name], MRN:  [id number], DOB:  1950/12/07, LOS: 1 ADMISSION DATE:  11/11/2018, CONSULTATION DATE:  11/11/18 REFERRING MD:  Dr. Wilson Singer, CHIEF COMPLAINT:  Weakness    Brief History   [name] is a 68 y.o. male who was admitted 2/11 with septic shock presumed due to recurrent klebsiella bacteremia.  He has a hx of recurrent klebsiella bacteremia of unclear source despite being followed / worked up by ID extensively (including negative TEE/R AVF US/L wrist aspiration).  Also has hx of C.diff colitis.  He remained mildly hypotensive despite 3.5L NS bolus and was subsequently started on levophed; therefore, PCCM was called for ICU admission.  History of present illness   [name] is a 68 y.o. male who has a PMH as outlined below including but not limited to ESRD on HD TTS and multiple recent admissions for recurrent klebsiella bacteremia with unclear source despite extensive workup (see "past medical history" for rest).  He presented to [facility] ED 2/11 from dialysis due to weakness and "not being himself" per his wife.  He had recent admission 10/03/18 through 10/09/18 where he was again found to have recurrent klebsiella bacteremia (dating back to Oct 2019) and was treated with IV ceftriaxone initially which was then changed to PO keflex x 14 days.  Per wife, he recently went from 2 pills of Keflex to 1 pill daily along with PO vanc for his C.diff, and he was supposed to have follow up with ID on 11/12/18.  Per wife, on 2/11 after HD session, he just did not seem like himself.  He was brought to ED where he was found to have fever to 102 and hypotension with SBP in the 70s.  He remained hypotensive despite 3.5L NS; therefore, he was started on norepinephrine.  He states earlier in the day, he had some abdomen pain but this has since resolved.  He also experienced chills throughout the day but did not feel like he had a fever.  Denies any lightheadedness, chest pain, dyspnea, cough, N/V, myalgias.   Has mild occasional diarrhea; though, has hx of C.diff colitis for which he is still on PO vanc as noted above.  Past Medical History  ESRD (HD TTS) Recurrent Klebsiella bacteremia C diff colitis Hypotension HLD OSA on CPAP Prostate Ca s/p radiotherapy  Gout DM Hyperthyroidism  CVA  Depression Colonic polyps   Significant Hospital Events   2/11 > admit, on pressors   Consults:  PCCM Nephrology  ID  Procedures:  None  Significant Diagnostic Tests:  CT A / P 2/11 > no acute process, fatty infiltration of the liver.  Micro Data:  Blood cx 2/11 > pending Influenza  2/11 > neg  Antimicrobials:  Ceftriaxone 2/11 >  Vanc PO 2/11 >  Vanc IV x 1 in ED 2/11   Interim history/subjective:  Patient reports he feels much better today. States he does not have chest pain or SOB. States that yesterday he did not feel well and was not talking right after HD. States that has resolved. No longer having diarrhea but per RN having soft clay colored stool.   Objective   Blood pressure 137/78, pulse 76, temperature 97.8 F (36.6 C), temperature source Axillary, resp. rate 17, height 6\' 1"  (1.854 m), weight 102.2 kg, SpO2 92 %.        Intake/Output Summary (Last 24 hours) at 11/12/2018 0828 Last data filed at 11/12/2018 0527 Gross per 24 hour  Intake 4825.83 ml  Output 1 ml  Net 4824.83 ml   Filed Weights   11/11/18 1707 11/11/18 2200 11/12/18 0319  Weight: 97.5 kg 102.2 kg 102.2 kg    Examination: General: awake and alert, laying in bed, NAD  HENT: moist mucous membranes, no scleral icterus  Lungs: CTAB, no wheezes, rales, or rhonchi  Cardiovascular: RRR, no MRG  Abdomen: soft, non tender, distended, bowel sounds prsent  Extremities: no edema, 2+ DP pulses, small diabetic wound on lateral aspect of left food Neuro: awake and alert,  GU: not examined   Resolved Hospital Problem list     Assessment & Plan:  Septic shock - presumed recurrent klebsiella bacteremia +/-  C.diff (multipel admissions for this in the past with no clear source identified despite extensive workup including negative TEE/R AVF US/L wrist aspiration.  Is followed by ID). LA improved 2.7>1.5. Worsening leukocytosis 8.2>18.7 - Continue rocephin (2/11>) - ID consulted, appreciate recommendations  - Follow cultures. - Continue levophed as needed for goal MAP > 65. - Start midodrine, consider continue on discharge.  Hx C.diff colitis. - Continue PO vanc. - ID consulted  ESRD on HD TTS (last session Tues 2/11). Hyperkalemia 5.4>6.2  - Nephrology consulted, appreciate recommendations  Transaminitis. Stable. AST 332>224. ALT 211>230 - Trend LFT's.  Hx ICM, mixed CHF (echo from Jan 2020 with EF 45-50%, G1DD) HLD. - Continue preadmission ASA, zetia.  OSA on CPAP. - Continue nocturnal CPAP.  Hx hyperthyroidism. TSH wnl  - Continue preadmission methimazole.  Hx DM. - SSI.  Hx gout. - Continue preadmission allopurinol.  Hx CVA, depression, peripheral neuropathy. - Continue preadmission ASA. - Hold preadmission citalopram, gabapentin  Stage 1 diabetic ulcer Patient with diabetic ulcer. States it has been present since last hospitalization -wound care consulted   Best practice:  Diet: Renal  Pain/Anxiety/Delirium protocol (if indicated): N/A VAP protocol (if indicated): N/A  DVT prophylaxis: SQ heparin  GI prophylaxis: N/A  Glucose control: SSI  Mobility: BR  Code Status: Full  Family Communication: no family at bedside  Disposition: ICU  Labs   CBC: Recent Labs  Lab 11/11/18 1701 11/12/18 0309  WBC 8.2 18.7*  NEUTROABS 7.5  --   HGB 13.1 11.4*  HCT 43.0 37.4*  MCV 98.9 96.4  PLT 233 333    Basic Metabolic Panel: Recent Labs  Lab 11/11/18 1701 11/12/18 0309  NA 139 138  K 5.4* 6.2*  CL 101 105  CO2 22 24  GLUCOSE 199* 212*  BUN 41* 43*  CREATININE 10.04* 9.73*  CALCIUM 8.2* 7.8*  MG  --  1.9  PHOS  --  3.4   GFR: Estimated  Creatinine Clearance: 9 mL/min (A) (by C-G formula based on SCr of 9.73 mg/dL (H)). Recent Labs  Lab 11/11/18 1701 11/11/18 2308 11/12/18 0309  WBC 8.2  --  18.7*  LATICACIDVEN  --  2.7* 1.5    Liver Function Tests: Recent Labs  Lab 11/11/18 1701 11/12/18 0309  AST 332* 224*  ALT 211* 230*  ALKPHOS 181* 170*  BILITOT 1.9* 2.4*  PROT 7.3 6.6  ALBUMIN 3.2* 2.8*   No results for input(s): LIPASE, AMYLASE in the last 168 hours. No results for input(s): AMMONIA in the last 168 hours.  ABG    Component Value Date/Time   PHART 7.410 08/20/2018 1113   PCO2ART 32.7 08/20/2018 1113   PO2ART 82.3 (L) 08/20/2018 1113   HCO3 19.7 (L) 08/20/2018 1113   TCO2 28 08/19/2018 1716   ACIDBASEDEF 3.7 (H) 08/20/2018 1113  O2SAT 94.3 08/20/2018 1113     Coagulation Profile: No results for input(s): INR, PROTIME in the last 168 hours.  Cardiac Enzymes: No results for input(s): CKTOTAL, CKMB, CKMBINDEX, TROPONINI in the last 168 hours.  HbA1C: Hgb A1c MFr Bld  Date/Time Value Ref Range Status  10/04/2018 11:05 AM 6.0 (H) 4.8 - 5.6 % Final    Comment:    (NOTE) Pre diabetes:          5.7%-6.4% Diabetes:              >6.4% Glycemic control for   <7.0% adults with diabetes   07/16/2018 10:35 PM 5.7 (H) 4.8 - 5.6 % Final    Comment:    (NOTE) Pre diabetes:          5.7%-6.4% Diabetes:              >6.4% Glycemic control for   <7.0% adults with diabetes     CBG: Recent Labs  Lab 11/11/18 1542 11/11/18 2229 11/12/18 0715  GLUCAP 165* 127* 132*    Review of Systems:   Negative ROS  Critical care time: 30 min

## 2018-11-12 NOTE — Consult Note (Signed)
[facility] for Infectious Disease    Date of Admission:  11/11/2018     Total days of antibiotics 2               Reason for Consult: Re-current Klebsiella Bacteremia Referring Provider:  Sabra Heck Primary Care Provider: Biagio Borg, MD   Assessment/Plan:  Mr. Eschmann is a 68 y/o male with multiple hospitalizations for recurrent Klebsiella bacteremia without clear source. Blood cultures remain pending and currently unclear if he has bacteremia as blood cultures remain in process. Speaking with Mr. Proby it is unclear if he has been taking recent antibiotics that would possibly effect the cultures. He does continue to have left wrist pain which he reports is much improved and his AVF site is without evidence of infection. He did have abdominal pain upon admission which has resolved and CT imaging has been without abscess or lesions. Mycotic aneurysm cannot be ruled out either. Recommend continuing with broad spectrum vancomycin and ceftriaxone pending blood culture results. We could consider imaging his left ankle as he does have a wound here but does not appear infected at present.   Mr. Pienta was recently treated for C. Diff infection and was reportedly taking oral vancomycin on admission. He continues to have bowel movements of 3-4 per day and remains likely colonized with C. Diff and no active infection.  1. Continue vancomycin and ceftraixone. 2. Will attempt to speak with family regarding medication list. 3. Monitor cultures and fever curve.    Active Problems:   Septic shock ([place])   . allopurinol  100 mg Oral Daily  . aspirin  324 mg Oral Daily  . ezetimibe  10 mg Oral Daily  . gabapentin  100 mg Oral QHS  . heparin  5,000 Units Subcutaneous Q8H  . hydrocortisone sod succinate (SOLU-CORTEF) inj  50 mg Intravenous Q8H  . insulin aspart  0-9 Units Subcutaneous TID WC  . methimazole  2.5 mg Oral Daily  . midodrine  5 mg Oral TID WC  . sodium zirconium cyclosilicate  10  g Oral Once  . vancomycin  125 mg Oral Q12H     HPI: LOCHLANN MASTRANGELO is a 68 y.o. male with previous medical history of ESRD on dialysis through AVF, prostate cancer, diabetes, dementia, combined systolic/diastolic heart failure and re-current Klebseilla bacteremia admitted with fever and weakness.   Mr. Hoeffner is known to the ID service with recurrent Klebsiella bacteremia (08/2018 & 10/2018) with unclear source and C. Difficile. His clinical course has included Ampicillin resistant Klebsiella pneumonia on 07/16/18 (treated for 5 days with Ceftriaxone and 10 days of Omnicef), Ampicillin and Unasyn resistant Klebsiella pneumoniae on 08/20/19 (treated with 2 weeks of Ceftazidime with dialysis) , and Ampicillin resistant Klebsiella pneumoniae on 10/04/18 (treated with Ceftriaxone and Keflex for 2 weeks). He was scheduled to complete vancomycin for C. Diff on 10/22/18.   Found to be febrile with a temperature of 102.7 in the ED with hypotension. He was volume resuscitated with 3.5 L of fluid. Since that time he has remained afebrile with WBC count of 18.7. Blood cultures drawn and remain pending. CT of the abdomen/pelvis with no acute abnormalities and fatty liver present.  He has received doses of vancomycin, cefepime and metronidazole with current regimen of vancomycin and ceftriaxone.   Feeling better today with no current abdominal pain. Symptoms started on Sunday and described as not feeling well which progressed to weakness on Monday. Able to complete 1 hour of dialysis  yesterday with the development of chills. He is unclear as to what happened after that as he "passed out." Continues to have left wrist pain which is improving slowly. There is a wound located on his left ankle that has been there since previous hospitalization and was recently "bumped" by EMS.    Review of Systems: Review of Systems  Constitutional: Negative for chills, fever and malaise/fatigue.  Cardiovascular: Negative for chest pain  and leg swelling.  Gastrointestinal: Negative for abdominal pain, constipation, diarrhea, nausea and vomiting.  Genitourinary: Negative for frequency and urgency.  Musculoskeletal:       Positive for left wrist pain.   Skin: Negative for rash.     Past Medical History:  Diagnosis Date  . Acute encephalopathy 10/04/2018  . Allergic rhinitis, cause unspecified 02/24/2014  . Anemia 06/16/2011  . BENIGN PROSTATIC HYPERTROPHY 10/14/2009  . CAD, NATIVE VESSEL 02/06/2009   a. 06/2007 s/p Taxus DES to the RCA;  b. 08/2016 NSTEMI in setting of SVT/PCI: LM 30ost, LAD 15m (3.0x16 Synergy DES), LCX 32m, OM1 60, RI 40, RCA 70p/m, 48m - not amenable to PCI.  [name] Cervical radiculopathy, chronic 02/23/2016   Right c5-6 by NCS/EMG  . Chest pain 08/09/2015  . Chronic combined systolic (congestive) and diastolic (congestive) heart failure ([place])    a. 10/2016 Echo: EF 40-45%, Gr2 DD. mildly dil LA.  [name] COLONIC POLYPS, HX OF 10/14/2009  . Dementia ([place]) [id number]  . Depression 09/24/2015  . DIABETES MELLITUS, TYPE II 02/01/2010  . DYSLIPIDEMIA 06/18/2007  . ESRD (end stage renal disease) on dialysis ([place]) 08/04/2010   ESRD due to DM adn HTN, started HD in 2011. As of 2019 has a R arm graft and gets HD on TTS sched  . FOOT PAIN 08/12/2008  . GAIT DISTURBANCE 03/03/2010  . GASTROENTERITIS, VIRAL 10/14/2009  . GERD 06/18/2007  . GOITER, MULTINODULAR 12/26/2007  . GOUT 06/18/2007  . GYNECOMASTIA 07/17/2010  . Hemodialysis access, fistula mature [facility])    Dialysis T-Th-Sa ([place]) Right upper arm fistula  . Hyperlipidemia 10/16/2011  . Hyperparathyroidism, secondary ([place]) 06/16/2011  . HYPERTENSION 06/18/2007  . Hyperthyroidism   . Hypocalcemia 06/07/2010  . Ischemic cardiomyopathy    a. 10/2016 Echo: EF 40-45%.  . Lumbar stenosis with neurogenic claudication   . ONYCHOMYCOSIS, TOENAILS 12/26/2007  . OSA on CPAP 10/16/2011  . Other malaise and fatigue 11/24/2009  . PERIPHERAL NEUROPATHY 06/18/2007  .  Prostate cancer ([place])   . PSVT (paroxysmal supraventricular tachycardia) ([place])    a. 06/7352 complicated by NSTEMI;  b. 11/2016 Treated w/ adenosine in ED;  c. 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  [name] PULMONARY NODULE, RIGHT LOWER LOBE 06/08/2009  . Sleep apnea    cpap machine and o2  . TRANSAMINASES, SERUM, ELEVATED 02/01/2010  . Transfusion history    none recent  . Unspecified hypotension 01/30/2010    Social History   Tobacco Use  . Smoking status: Former Smoker    Packs/day: 1.00    Years: 25.00    Pack years: 25.00    Types: Cigarettes    Last attempt to quit: 10/01/2005    Years since quitting: 13.1  . Smokeless tobacco: Former Systems developer    Quit date: 10/01/2005  . Tobacco comment: Quit smoking 2007 Smoked x 25 years 1/2 ppd.  Substance Use Topics  . Alcohol use: No    Alcohol/week: 0.0 standard drinks  . Drug use: No    Family History  Problem Relation Age of Onset  .  Heart disease Sister   . Thyroid nodules Sister   . Heart disease Father   . Diabetes Father   . Kidney failure Father   . Hypertension Father   . Healthy Child   . Healthy Child   . Healthy Child   . Cancer Neg Hx     Allergies  Allergen Reactions  . Statins Other (See Comments)    Weak muscles  . Ciprofloxacin Rash    OBJECTIVE: Blood pressure 135/79, pulse 81, temperature 97.8 F (36.6 C), temperature source Axillary, resp. rate 18, height 6\' 1"  (1.854 m), weight 102.2 kg, SpO2 96 %.  Physical Exam Constitutional:      General: He is not in acute distress.    Appearance: He is well-developed and overweight. He is not ill-appearing.     Comments: Seated in the recliner; sleeping upon entrance and easily awakened and pleasant.   Cardiovascular:     Rate and Rhythm: Normal rate and regular rhythm.     Heart sounds: Normal heart sounds.     Comments: AV fistula with positive bruit/thrill and no tenderness, induration or signs of infection.  Pulmonary:     Effort: Pulmonary effort is normal.     Breath  sounds: Normal breath sounds.  Musculoskeletal:     Comments: Left wrist with no obvious deformity, discoloration or edema.   Skin:    General: Skin is warm and dry.     Comments: Wound noted on left distal lower extremity above lateral malleolus. Covered with a foam bandage. No clear evidence of infection.   Neurological:     Mental Status: He is alert and oriented to person, place, and time.  Psychiatric:        Behavior: Behavior normal.        Thought Content: Thought content normal.        Judgment: Judgment normal.     Lab Results Lab Results  Component Value Date   WBC 18.7 (H) 11/12/2018   HGB 11.4 (L) 11/12/2018   HCT 37.4 (L) 11/12/2018   MCV 96.4 11/12/2018   PLT 216 11/12/2018    Lab Results  Component Value Date   CREATININE 9.73 (H) 11/12/2018   BUN 43 (H) 11/12/2018   NA 138 11/12/2018   K 6.2 (H) 11/12/2018   CL 105 11/12/2018   CO2 24 11/12/2018    Lab Results  Component Value Date   ALT 230 (H) 11/12/2018   AST 224 (H) 11/12/2018   ALKPHOS 170 (H) 11/12/2018   BILITOT 2.4 (H) 11/12/2018     Microbiology: Recent Results (from the past 240 hour(s))  MRSA PCR Screening     Status: None   Collection Time: 11/11/18 10:12 PM  Result Value Ref Range Status   MRSA by PCR NEGATIVE NEGATIVE Final    Comment:        The GeneXpert MRSA Assay (FDA approved for NASAL specimens only), is one component of a comprehensive MRSA colonization surveillance program. It is not intended to diagnose MRSA infection nor to guide or monitor treatment for MRSA infections. Performed at [facility], [place] [address], [place], [place] [address]      Terri Piedra, [place] [facility] [phone] Pager  11/12/2018  9:53 AM

## 2018-11-12 NOTE — Progress Notes (Signed)
Patient places self on CPAP machine.  RT added sterile water to CPAP water chamber.

## 2018-11-12 NOTE — Patient Outreach (Signed)
[facility]) Care Management  11/12/2018  [name] [birth year] [id number]   Noted that member was admitted to hospital last night for AMS/septic shock.  Hospital liaisons notified, this care manager will follow up pending disposition.  [name], [place], MSN [place] [phone]

## 2018-11-12 NOTE — Consult Note (Signed)
   [facility] CM Inpatient Consult   11/12/2018  ORIE BAXENDALE 09-Feb-1951 [id number]   Patient is currently active with [facility] for chronic disease management services. Patient has an extreme high risk score for unplanned readmissions. Patient with Medicare.  Chart review reveals patient is a 68 y.o male with prior admissions for Klebsiella bacteremia, no source identified, presents from dialysis tonight for hypotension, not responding alone to volume resuscitation. Patient denies diarrhea, bloody stools, or emesis. Reports abdominal pain earlier today that resolved spontaneously per MD History and Physical. Patient has HD on Tuesday, Thursday, Saturday at [facility].   Patient has been engaged by a [place].  Our community based plan of care has focused on disease management and community resource support.  Patient will receive a post hospital call and will be evaluated for assessments and disease process education.  His primary care provider is Dr. [name] and this office provides the transition of care follow up calls and appointments.  Will follow for progress and disposition needs.  Of note, [facility] Care Management services does not replace or interfere with any services that are needed or arranged by inpatient case management or social work.  For additional questions or referrals please contact:   Natividad Brood, RN BSN [facility] Liaison  [phone] business mobile phone Toll free office [phone]

## 2018-11-12 NOTE — Consult Note (Signed)
Renal Service Consult Note [facility] 11/12/2018 [place] Requesting Physician:  Dr Arvin Collard  Reason for Consult:  ESRD pt w/ fevers r/o sepsis HPI: The patient is a 68 y.o. year-old with hx of PSVT, prostate Ca (sp radiation), radiation proctitis/ GIB's, ICM, HTN, Hl, gout, MN goiter, DM2, depressoin, combined CHF and ESRD on HD presented to ED yesterday from dialysis with AMS, also had been feeling ill x 2 days at home per wife. Had chills as well, nausea and several loose stools.  Hx recent Cdif on po vanc. Also has had 2-3 recent hosptial admits for recurrent Klebs bacteremia of unknown source. Had TEE on last admit.   Admit temp was 103deg F, WBC 18k.  Patient today feeling 'much better".  No SOB, cough or cP, no abd pain or diarrhea today.  No drainage from HD fistula.  Had recent procedure on his AVF to "open up a blockage" at [facility].  This is per the patient. Wife is at bedside.   ROS  denies CP  no joint pain   no HA  no blurry vision  no rash  no diarrhea  no nausea/ vomiting  no dysuria  no difficulty voiding  no change in urine color    Past Medical History  Past Medical History:  Diagnosis Date  . Acute encephalopathy 10/04/2018  . Allergic rhinitis, cause unspecified 02/24/2014  . Anemia 06/16/2011  . BENIGN PROSTATIC HYPERTROPHY 10/14/2009  . CAD, NATIVE VESSEL 02/06/2009   a. 06/2007 s/p Taxus DES to the RCA;  b. 08/2016 NSTEMI in setting of SVT/PCI: LM 30ost, LAD 42m (3.0x16 Synergy DES), LCX 103m, OM1 60, RI 40, RCA 70p/m, 72m - not amenable to PCI.  [name] Cervical radiculopathy, chronic 02/23/2016   Right c5-6 by NCS/EMG  . Chest pain 08/09/2015  . Chronic combined systolic (congestive) and diastolic (congestive) heart failure ([place])    a. 10/2016 Echo: EF 40-45%, Gr2 DD. mildly dil LA.  [name] COLONIC POLYPS, HX OF 10/14/2009  . Dementia ([place]) [id number]  . Depression 09/24/2015  . DIABETES MELLITUS, TYPE II 02/01/2010  .  DYSLIPIDEMIA 06/18/2007  . ESRD (end stage renal disease) on dialysis ([place]) 08/04/2010   ESRD due to DM adn HTN, started HD in 2011. As of 2019 has a R arm graft and gets HD on TTS sched  . FOOT PAIN 08/12/2008  . GAIT DISTURBANCE 03/03/2010  . GASTROENTERITIS, VIRAL 10/14/2009  . GERD 06/18/2007  . GOITER, MULTINODULAR 12/26/2007  . GOUT 06/18/2007  . GYNECOMASTIA 07/17/2010  . Hemodialysis access, fistula mature [facility])    Dialysis T-Th-Sa ([place]) Right upper arm fistula  . Hyperlipidemia 10/16/2011  . Hyperparathyroidism, secondary ([place]) 06/16/2011  . HYPERTENSION 06/18/2007  . Hyperthyroidism   . Hypocalcemia 06/07/2010  . Ischemic cardiomyopathy    a. 10/2016 Echo: EF 40-45%.  . Lumbar stenosis with neurogenic claudication   . ONYCHOMYCOSIS, TOENAILS 12/26/2007  . OSA on CPAP 10/16/2011  . Other malaise and fatigue 11/24/2009  . PERIPHERAL NEUROPATHY 06/18/2007  . Prostate cancer ([place])   . PSVT (paroxysmal supraventricular tachycardia) ([place])    a. 41/2878 complicated by NSTEMI;  b. 11/2016 Treated w/ adenosine in ED;  c. 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  [name] PULMONARY NODULE, RIGHT LOWER LOBE 06/08/2009  . Sleep apnea    cpap machine and o2  . TRANSAMINASES, SERUM, ELEVATED 02/01/2010  . Transfusion history    none recent  . Unspecified hypotension 01/30/2010  Past Surgical History  Past Surgical History:  Procedure Laterality Date  . A/V SHUNTOGRAM N/A 04/07/2018   Procedure: A/V SHUNTOGRAM;  Surgeon: Waynetta Sandy, MD;  Location: [facility];  Service: Cardiovascular;  Laterality: N/A;  . ARTERIOVENOUS GRAFT PLACEMENT Right 2009   forearm/notes 02/01/2011  . AV FISTULA PLACEMENT  11/07/2011   Procedure: INSERTION OF ARTERIOVENOUS (AV) GORE-TEX GRAFT ARM;  Surgeon: Tinnie Gens, MD;  Location: [facility];  Service: Vascular;  Laterality: Left;  . BACK SURGERY  1998  . BASCILIC VEIN TRANSPOSITION Right 02/27/2013   Procedure: BASCILIC VEIN TRANSPOSITION;  Surgeon: Mal Misty, MD;  Location: [facility];  Service: Vascular;  Laterality: Right;  Right Basilic Vein Transposition   . CARDIAC CATHETERIZATION N/A 08/06/2016   Procedure: Left Heart Cath and Coronary Angiography;  Surgeon: Jolaine Artist, MD;  Location: [facility];  Service: Cardiovascular;  Laterality: N/A;  . CARDIAC CATHETERIZATION N/A 08/07/2016   Procedure: Coronary/Graft Atherectomy-CSI LAD;  Surgeon: Peter M Martinique, MD;  Location: [facility] CV LAB;  Service: Cardiovascular;  Laterality: N/A;  . CERVICAL SPINE SURGERY  2/09   "to repair nerve problems in my left arm"  . CHOLECYSTECTOMY    . COLONOSCOPY WITH PROPOFOL N/A 04/26/2017   Procedure: COLONOSCOPY WITH PROPOFOL;  Surgeon: Otis Brace, MD;  Location: [place];  Service: Gastroenterology;  Laterality: N/A;  . CORONARY ANGIOPLASTY WITH STENT PLACEMENT  06/11/2008  . CORONARY ANGIOPLASTY WITH STENT PLACEMENT  06/2007   TAXUS stent to RCA/notes 01/31/2011  . ESOPHAGOGASTRODUODENOSCOPY  09/28/2011   Procedure: ESOPHAGOGASTRODUODENOSCOPY (EGD);  Surgeon: Missy Sabins, MD;  Location[facility] ENDOSCOPY;  Service: Endoscopy;  Laterality: N/A;  . ESOPHAGOGASTRODUODENOSCOPY N/A 04/07/2015   Procedure: ESOPHAGOGASTRODUODENOSCOPY (EGD);  Surgeon: Teena Irani, MD;  Location: [facility];  Service: Endoscopy;  Laterality: N/A;  . ESOPHAGOGASTRODUODENOSCOPY N/A 04/19/2015   Procedure: ESOPHAGOGASTRODUODENOSCOPY (EGD);  Surgeon: Arta Silence, MD;  Location: [facility] ENDOSCOPY;  Service: Endoscopy;  Laterality: N/A;  . ESOPHAGOGASTRODUODENOSCOPY (EGD) WITH PROPOFOL N/A 06/13/2018   Procedure: ESOPHAGOGASTRODUODENOSCOPY (EGD) WITH PROPOFOL;  Surgeon: Wonda Horner, MD;  Location: [facility] ENDOSCOPY;  Service: Endoscopy;  Laterality: N/A;  . FLEXIBLE SIGMOIDOSCOPY N/A 05/21/2017   Procedure: Conni Elliot;  Surgeon: Clarene Essex, MD;  Location: [place];  Service: Endoscopy;  Laterality: N/A;  . FLEXIBLE SIGMOIDOSCOPY Left 07/02/2017    Procedure: FLEXIBLE SIGMOIDOSCOPY;  Surgeon: Laurence Spates, MD;  Location: [facility];  Service: Endoscopy;  Laterality: Left;  . FOREIGN BODY REMOVAL  09/2003   via upper endoscopy/notes 02/12/2011  . GIVENS CAPSULE STUDY  09/30/2011   Procedure: GIVENS CAPSULE STUDY;  Surgeon: Jeryl Columbia, MD;  Location: [facility] ENDOSCOPY;  Service: Endoscopy;  Laterality: N/A;  . INSERTION OF DIALYSIS CATHETER Right 2014  . INSERTION OF DIALYSIS CATHETER Left 02/11/2013   Procedure: INSERTION OF DIALYSIS CATHETER;  Surgeon: Conrad Churchville, MD;  Location: [place];  Service: Vascular;  Laterality: Left;  Ultrasound guided  . LUMBAR LAMINECTOMY/DECOMPRESSION MICRODISCECTOMY Bilateral 07/31/2017   Procedure: LAMINECTOMY AND FORAMINOTOMY- BILATERAL LUMBAR TWO- LUMBAR THREE;  Surgeon: Earnie Larsson, MD;  Location: [place];  Service: Neurosurgery;  Laterality: Bilateral;  LAMINECTOMY AND FORAMINOTOMY- BILATERAL LUMBAR 2- LUMBAR 3  . PERIPHERAL VASCULAR BALLOON ANGIOPLASTY  04/07/2018   Procedure: PERIPHERAL VASCULAR BALLOON ANGIOPLASTY;  Surgeon: Waynetta Sandy, MD;  Location: [place] CV LAB;  Service: Cardiovascular;;  RUE AVF  . REMOVAL OF A DIALYSIS CATHETER Right 02/11/2013   Procedure: REMOVAL OF A DIALYSIS CATHETER;  Surgeon: Conrad Cimarron, MD;  Location: [facility];  Service: Vascular;  Laterality: Right;  . SAVORY DILATION N/A 04/07/2015   Procedure: SAVORY DILATION;  Surgeon: Teena Irani, MD;  Location: [facility] ENDOSCOPY;  Service: Endoscopy;  Laterality: N/A;  . SHUNTOGRAM N/A 09/20/2011   Procedure: Earney Mallet;  Surgeon: Conrad [place], MD;  Location: [facility] CATH LAB;  Service: Cardiovascular;  Laterality: N/A;  . SVT ABLATION N/A 11/26/2016   Procedure: SVT Ablation;  Surgeon: Will Meredith Leeds, MD;  Location: [place] CV LAB;  Service: Cardiovascular;  Laterality: N/A;  . TEE WITHOUT CARDIOVERSION N/A 08/25/2018   Procedure: TRANSESOPHAGEAL ECHOCARDIOGRAM (TEE);  Surgeon: Dorothy Spark, MD;  Location[facility]  ENDOSCOPY;  Service: Cardiovascular;  Laterality: N/A;  . TONSILLECTOMY    . TOTAL KNEE ARTHROPLASTY Right 08/02/2015   Procedure: TOTAL KNEE ARTHROPLASTY;  Surgeon: Renette Butters, MD;  Location: [facility];  Service: Orthopedics;  Laterality: Right;  . VENOGRAM N/A 01/26/2013   Procedure: VENOGRAM;  Surgeon: Angelia Mould, MD;  Location: [facility] CATH LAB;  Service: Cardiovascular;  Laterality: N/A;   Family History  Family History  Problem Relation Age of Onset  . Heart disease Sister   . Thyroid nodules Sister   . Heart disease Father   . Diabetes Father   . Kidney failure Father   . Hypertension Father   . Healthy Child   . Healthy Child   . Healthy Child   . Cancer Neg Hx    Social History  reports that he quit smoking about 13 years ago. His smoking use included cigarettes. He has a 25.00 pack-year smoking history. He quit smokeless tobacco use about 13 years ago. He reports that he does not drink alcohol or use drugs. Allergies  Allergies  Allergen Reactions  . Statins Other (See Comments)    Weak muscles  . Ciprofloxacin Rash   Home medications Prior to Admission medications   Medication Sig Start Date End Date Taking? Authorizing Provider  acetaminophen (TYLENOL) 325 MG tablet Take 650 mg by mouth every 6 (six) hours as needed for mild pain or headache.    Yes [provider]  acetaminophen-codeine (TYLENOL #3) 300-30 MG tablet Take 1-2 tablets by mouth 4 (four) times daily as needed. 11/05/18  Yes [provider]  Alirocumab (PRALUENT) 150 MG/ML SOPN Inject 150 mg into the skin every 14 (fourteen) days. 03/05/18  Yes Lorretta Harp, MD  allopurinol (ZYLOPRIM) 100 MG tablet TAKE ONE TABLET BY MOUTH ONCE DAILY Patient taking differently: Take 100 mg by mouth daily.  10/14/17  Yes Biagio Borg, MD  aspirin EC 325 MG tablet Take 1 tablet (325 mg total) by mouth daily. 10/10/18  Yes Hongalgi, Lenis Dickinson, MD  betamethasone dipropionate (DIPROLENE) 0.05 % cream  Apply 1 application topically daily as needed (leg sores).   Yes [provider]  citalopram (CELEXA) 10 MG tablet Take 1 tablet (10 mg total) by mouth at bedtime. 10/14/17  Yes Biagio Borg, MD  colchicine 0.6 MG tablet Take 1 tablet (0.6 mg total) by mouth daily. 08/08/18  Yes Biagio Borg, MD  doxercalciferol ([name]) 4 MCG/2ML injection Inject 1 mL (2 mcg total) into the vein Every Tuesday,Thursday,and Saturday with dialysis. 09/02/18  Yes Hall, Archie Patten N, DO  ezetimibe (ZETIA) 10 MG tablet TAKE ONE TABLET BY MOUTH ONCE DAILY Patient taking differently: Take 10 mg by mouth daily.  04/08/17  Yes Bensimhon, Shaune Pascal, MD  ferric citrate (AURYXIA) 1 GM 210 MG(Fe)  tablet Take 2 tablets (420 mg total) by mouth 3 (three) times daily with meals. 08/30/18  Yes Irene Pap N, DO  gabapentin (NEURONTIN) 100 MG capsule Take 1 capsule (100 mg total) by mouth at bedtime. 08/31/18  Yes Kayleen Memos, DO  hydroxypropyl methylcellulose / hypromellose (ISOPTO TEARS / GONIOVISC) 2.5 % ophthalmic solution Place 1 drop into both eyes 3 (three) times daily as needed for dry eyes. 05/03/17  Yes Biagio Borg, MD  methimazole (TAPAZOLE) 5 MG tablet Take 0.5 tablets (2.5 mg total) by mouth daily. 05/16/18  Yes Renato Shin, MD  multivitamin (RENA-VIT) TABS tablet Take 1 tablet by mouth daily.    Yes [provider]  Nutritional Supplements (FEEDING SUPPLEMENT, NEPRO CARB STEADY,) LIQD Take 237 mLs by mouth 2 (two) times daily between meals. 08/30/18  Yes Hall, Carole N, DO  pantoprazole (PROTONIX) 40 MG tablet Take 40 mg by mouth at bedtime.  11/03/18  Yes [provider]  polyethylene glycol (MIRALAX / GLYCOLAX) packet Take 17 g by mouth daily. Patient taking differently: Take 17 g by mouth daily as needed for mild constipation or moderate constipation.  05/23/17  Yes Florencia Reasons, MD  SENSIPAR 90 MG tablet Take 90 mg by mouth 2 (two) times daily. 09/18/16  Yes [provider]  vancomycin  (VANCOCIN) 125 MG capsule Take 1 capsule (125 mg total) by mouth 2 (two) times daily. 10/28/18  Yes [place] Callas, NP  WELCHOL 625 MG tablet TAKE 3 TABLETS BY MOUTH TWICE DAILY WITH MEALS Patient taking differently: Take 1,875 mg by mouth 2 (two) times daily with a meal.  11/07/18  Yes Biagio Borg, MD  zolpidem (AMBIEN) 5 MG tablet Take 1 tablet (5 mg total) by mouth at bedtime as needed. for sleep Patient taking differently: Take 5 mg by mouth at bedtime as needed for sleep.  06/20/18  Yes Biagio Borg, MD   Liver Function Tests Recent Labs  Lab 11/11/18 1701 11/12/18 0309  AST 332* 224*  ALT 211* 230*  ALKPHOS 181* 170*  BILITOT 1.9* 2.4*  PROT 7.3 6.6  ALBUMIN 3.2* 2.8*   No results for input(s): LIPASE, AMYLASE in the last 168 hours. CBC Recent Labs  Lab 11/11/18 1701 11/12/18 0309  WBC 8.2 18.7*  NEUTROABS 7.5  --   HGB 13.1 11.4*  HCT 43.0 37.4*  MCV 98.9 96.4  PLT 233 270   Basic Metabolic Panel Recent Labs  Lab 11/11/18 1701 11/12/18 0309 11/12/18 1557  NA 139 138 141  K 5.4* 6.2* 5.5*  CL 101 105 106  CO2 22 24 21*  GLUCOSE 199* 212* 111*  BUN 41* 43* 52*  CREATININE 10.04* 9.73* 10.77*  CALCIUM 8.2* 7.8* 8.3*  PHOS  --  3.4  --    Iron/TIBC/Ferritin/ %Sat    Component Value Date/Time   IRON 16 (L) 06/12/2018 0500   TIBC 168 (L) 06/12/2018 0500   FERRITIN 709 (H) 10/17/2016 1103   IRONPCTSAT 10 (L) 06/12/2018 0500    Vitals:   11/12/18 1300 11/12/18 1400 11/12/18 1500 11/12/18 1539  BP: (!) 159/84 (!) 144/80 (!) 155/73 (!) 153/78  Pulse: 84 70 68 70  Resp:  16 11   Temp:    98.1 F (36.7 C)  TempSrc:    Oral  SpO2: 93% 94% 94% 98%  Weight:      Height:       Exam Gen alert, no distress, calm No rash, cyanosis or gangrene Sclera anicteric, throat  clear  No jvd or bruits Chest clear bilat to bases RRR no MRG Abd soft ntnd no mass or ascites +bs GU normal male MS no joint effusions or deformity Ext no LE or UE edema, no wounds or  ulcers Neuro is alert, Ox 3 , nf RUA AVF +bruit, no drainage/ ulcers/ bleeding   Home meds:  - alirocumab 150mg  every 2 wks  - allopurinol 100 qd/ colchicine 0.6 qd/ ferric citrate 420mg  tid ac  - pantoprazole 40 hs/ methimazole 2.5 mg qd/ sensipar 90 qd  - welchol 1875mg  bid ac/ ezetimibe 10 qd/ aspirin 325 qd  - citalopram 10 qd/ zolpidem 5mg  hs prn  - po vancomycin 125mg  qid    [facility] TTS  4h  97.5kg   2/2.25 bath  P2  Hep none  RUA AVF  - hect 2 ug  - no esa   Assessment: 1. Fevers r/o sepsis 2. ^LFT's 3. H/o recurrent Klebs sepsis 4. ESRD on HD: had most of HD yest, no indication for HD today. K+^ 5. Volume no vol ^ on exam, up 4-5kg by wts though 6. Hypotension - overnight in ICU, better now 7. HL on sq Rx 8. MBD of ckd - cont meds 9. Anemia of ckd - no esa at OP center    P: 1. HD tomorrow, hopefully upstairs. Get stand wt pre HD.  No hep as usual.       [facility] 11/12/2018, 4:41 PM

## 2018-11-12 NOTE — Consult Note (Signed)
[facility] Nurse wound consult note Reason for Consult: Consult requested for left foot wound; pt states "the EMT bumped it when we were moving." Wound type: Left outer foot with partial thickness abrasion; .2X.2X.1cm Wound bed: red and moist, small amt blood-tinged drainage, no odor Dressing procedure/placement/frequency: Foam dressing to protect and promote healing. Please re-consult if further assistance is needed.  Thank-you,  Julien Girt MSN, [place], [place], [place], [place]

## 2018-11-13 ENCOUNTER — Inpatient Hospital Stay (HOSPITAL_COMMUNITY): Payer: Medicare Other

## 2018-11-13 DIAGNOSIS — I770 Arteriovenous fistula, acquired: Secondary | ICD-10-CM

## 2018-11-13 DIAGNOSIS — E0590 Thyrotoxicosis, unspecified without thyrotoxic crisis or storm: Secondary | ICD-10-CM

## 2018-11-13 DIAGNOSIS — A0471 Enterocolitis due to Clostridium difficile, recurrent: Secondary | ICD-10-CM

## 2018-11-13 DIAGNOSIS — T82590A Other mechanical complication of surgically created arteriovenous fistula, initial encounter: Secondary | ICD-10-CM

## 2018-11-13 DIAGNOSIS — Z8719 Personal history of other diseases of the digestive system: Secondary | ICD-10-CM

## 2018-11-13 LAB — RENAL FUNCTION PANEL
Albumin: 2.5 g/dL — ABNORMAL LOW (ref 3.5–5.0)
Anion gap: 17 — ABNORMAL HIGH (ref 5–15)
BUN: 56 mg/dL — ABNORMAL HIGH (ref 8–23)
CO2: 18 mmol/L — ABNORMAL LOW (ref 22–32)
Calcium: 7.9 mg/dL — ABNORMAL LOW (ref 8.9–10.3)
Chloride: 105 mmol/L (ref 98–111)
Creatinine, Ser: 11.6 mg/dL — ABNORMAL HIGH (ref 0.61–1.24)
GFR calc non Af Amer: 4 mL/min — ABNORMAL LOW (ref >60)
GFR, EST AFRICAN AMERICAN: 5 mL/min — AB (ref >60)
Glucose, Bld: 110 mg/dL — ABNORMAL HIGH (ref 70–99)
Phosphorus: 5.3 mg/dL — ABNORMAL HIGH (ref 2.5–4.6)
Potassium: 5.4 mmol/L — ABNORMAL HIGH (ref 3.5–5.1)
Sodium: 140 mmol/L (ref 135–145)

## 2018-11-13 LAB — GLUCOSE, CAPILLARY
Glucose-Capillary: 101 mg/dL — ABNORMAL HIGH (ref 70–99)
Glucose-Capillary: 63 mg/dL — ABNORMAL LOW (ref 70–99)
Glucose-Capillary: 64 mg/dL — ABNORMAL LOW (ref 70–99)
Glucose-Capillary: 98 mg/dL (ref 70–99)
Glucose-Capillary: 93 mg/dL (ref 70–99)

## 2018-11-13 LAB — CBC
HEMATOCRIT: 34.1 % — AB (ref 39.0–52.0)
Hemoglobin: 10.6 g/dL — ABNORMAL LOW (ref 13.0–17.0)
MCH: 29.9 pg (ref 26.0–34.0)
MCHC: 31.1 g/dL (ref 30.0–36.0)
MCV: 96.3 fL (ref 80.0–100.0)
Platelets: 189 10*3/uL (ref 150–400)
RBC: 3.54 MIL/uL — ABNORMAL LOW (ref 4.22–5.81)
RDW: 19.2 % — AB (ref 11.5–15.5)
WBC: 12.5 10*3/uL — AB (ref 4.0–10.5)
nRBC: 0 % (ref 0.0–0.2)

## 2018-11-13 IMAGING — NM NM MISC PROCEDURE
9 series · 54 of 54 positions shown · non-contrast
Comparison: none

[Series 1: rest sax · 6.4mm · 6.40mm/px · 6 of 25 frames shown]
[frame 3/25]
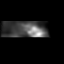
[frame 7/25]
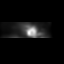
[frame 11/25]
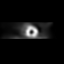
[frame 15/25]
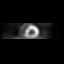
[frame 19/25]
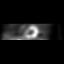
[frame 23/25]
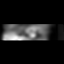

[Series 1: wbr_r-proj_st wbr rest · 6.40mm/px · 6 of 64 frames shown]
[frame 6/64]
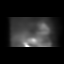
[frame 16/64]
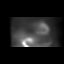
[frame 27/64]
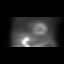
[frame 38/64]
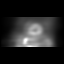
[frame 48/64]
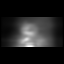
[frame 59/64]
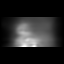

[Series 1: wbr rest · 6.40mm/px · 6 of 63 frames shown]
[frame 6/63]
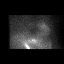
[frame 16/63]
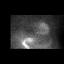
[frame 27/63]
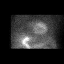
[frame 37/63]
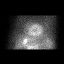
[frame 48/63]
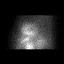
[frame 58/63]
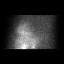

[Series 2: wbr stress-gsp · 6.40mm/px · 6 of 503 frames shown]
[frame 42/503  full-range]
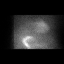
[frame 126/503  full-range]
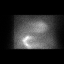
[frame 210/503  full-range]
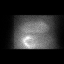
[frame 294/503  full-range]
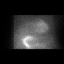
[frame 378/503  full-range]
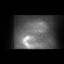
[frame 462/503  full-range]
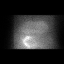

[Series 2: wbr_s-proj_st wbr stress-gsp · 6.40mm/px · 6 of 512 frames shown]
[frame 43/512]
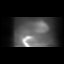
[frame 128/512]
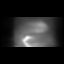
[frame 214/512]
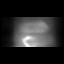
[frame 299/512]
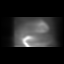
[frame 384/512]
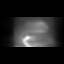
[frame 470/512]
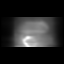

[Series 2: stress sax gs · 6.4mm · 6.40mm/px · 6 of 200 frames shown]
[frame 17/200]
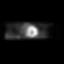
[frame 50/200]
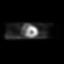
[frame 84/200]
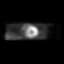
[frame 117/200]
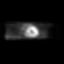
[frame 150/200]
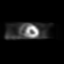
[frame 184/200]
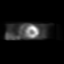

[Series 3: wbr stress-sum-em · 6.40mm/px · 6 of 64 frames shown]
[frame 6/64]
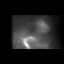
[frame 16/64]
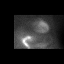
[frame 27/64]
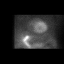
[frame 38/64]
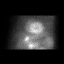
[frame 48/64]
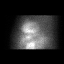
[frame 59/64]
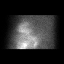

[Series 3: wbr_s-proj_st wbr stress-sum-em · 6.40mm/px · 6 of 64 frames shown]
[frame 6/64]
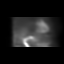
[frame 16/64]
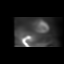
[frame 27/64]
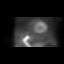
[frame 38/64]
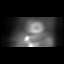
[frame 48/64]
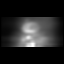
[frame 59/64]
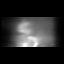

[Series 3: stress sax · 6.4mm · 6.40mm/px · 6 of 25 frames shown]
[frame 3/25]
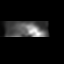
[frame 7/25]
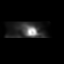
[frame 11/25]
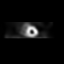
[frame 15/25]
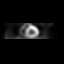
[frame 19/25]
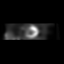
[frame 23/25]
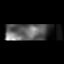

[54 of 54 positions shown; findings below may reference images not displayed]

Canned report from images found in remote index.

Refer to host system for actual result text.

## 2018-11-13 MED ORDER — CEFAZOLIN SODIUM-DEXTROSE 1-4 GM/50ML-% IV SOLN
1.0000 g | INTRAVENOUS | Status: DC
  Administered 2018-11-13 – 2018-11-14 (×2): 1 g via INTRAVENOUS
  Filled 2018-11-13 (×2): qty 50

## 2018-11-13 MED ORDER — GABAPENTIN 300 MG PO CAPS
300.0000 mg | ORAL_CAPSULE | Freq: Every day | ORAL | Status: DC
  Administered 2018-11-13 – 2018-11-15 (×3): 300 mg via ORAL
  Filled 2018-11-13 (×3): qty 1

## 2018-11-13 MED ORDER — DEXTROSE 50 % IV SOLN
INTRAVENOUS | Status: AC
  Administered 2018-11-13: 25 mL
  Filled 2018-11-13: qty 50

## 2018-11-13 MED ORDER — SODIUM ZIRCONIUM CYCLOSILICATE 10 G PO PACK
10.0000 g | PACK | Freq: Once | ORAL | Status: AC
  Administered 2018-11-13: 10 g via ORAL
  Filled 2018-11-13 (×2): qty 1

## 2018-11-13 MED ORDER — COLESEVELAM HCL 625 MG PO TABS
1875.0000 mg | ORAL_TABLET | Freq: Two times a day (BID) | ORAL | Status: DC
  Administered 2018-11-15 – 2018-11-16 (×2): 1875 mg via ORAL
  Filled 2018-11-13 (×7): qty 3

## 2018-11-13 MED ORDER — SODIUM ZIRCONIUM CYCLOSILICATE 10 G PO PACK
10.0000 g | PACK | Freq: Once | ORAL | Status: AC
  Administered 2018-11-13: 10 g via ORAL
  Filled 2018-11-13: qty 1

## 2018-11-13 MED ORDER — COLCHICINE 0.6 MG PO TABS
0.6000 mg | ORAL_TABLET | Freq: Every day | ORAL | Status: DC
  Administered 2018-11-13 – 2018-11-16 (×3): 0.6 mg via ORAL
  Filled 2018-11-13 (×4): qty 1

## 2018-11-13 MED ORDER — CITALOPRAM HYDROBROMIDE 20 MG PO TABS
10.0000 mg | ORAL_TABLET | Freq: Every day | ORAL | Status: DC
  Administered 2018-11-13 – 2018-11-15 (×3): 10 mg via ORAL
  Filled 2018-11-13 (×3): qty 1

## 2018-11-13 NOTE — Progress Notes (Signed)
IR aware of request for fistulagram - tentatively planned for 2/14. NPO after midnight, hold 0600 heparin, PA will see for consult/consent on 2/14.   Please call IR with questions or concerns.  Candiss Norse, PA-C

## 2018-11-13 NOTE — Progress Notes (Addendum)
PROGRESS NOTE        PATIENT DETAILS Name: [name] Age: 68 y.o. Sex: male Date of Birth: 11-May-1951 Admit Date: 11/11/2018 Admitting Physician Jesus Genera, MD DGL:OVFI, Hunt Oris, MD  Brief Narrative: Patient is a 68 y.o. male ESRD, prior history of recurrent Klebsiella bacteremia, recent history of C. difficile colitis DM-2, hypothyroidism, OSA on CPAP presented to the hospital on 2/11 from HD for hypotension, fever,transient abdominal pain, thought to have septic shock with unknown foci-admitted to the ICU with broad-spectrum antimicrobial therapy, and vasopressor support.  Upon clinical stability, transferred to the triad hospitalist service on 2/13.  Subjective: Denies any chest pain, shortness of breath.  Lying comfortably in bed.  Spouse at bedside.  Assessment/Plan: Septic shock: Sepsis pathophysiology has resolved.  Blood pressure stable. Blood culture negative.  Stool C. difficile PCR negative. No PNA seen on x-ray of the chest. CT of the abdomen with intrahepatic and extrahepatic dilatation. Given history of recurrent Klebsiella bacteremia--have ordered MRCP.  Remains on oral vancomycin due to recent history of C. difficile colitis.  ESRD on HD TTS: Nephrology following and directing care.  Transaminitis: Could be from shock liver or passed gall stone-repeat LFTs in the morning.  History of CVA: No focal deficits-continue aspirin.  History of depression: Continue Celexa  History of peripheral neuropathy: Continue gabapentin  Hyperthyroidism: Continue methimazole  Gout: No evidence of flare, continue allopurinol and colchicine.  OSA: CPAP nightly.   DVT Prophylaxis: Prophylactic Heparin   Code Status: Full code   Family Communication: Spouse at bedside  Disposition Plan: Remain inpatient  Antimicrobial agents: Anti-infectives (From admission, onward)   Start     Dose/Rate Route Frequency Ordered Stop   11/13/18 1200   vancomycin (VANCOCIN) IVPB 1000 mg/200 mL premix  Status:  Discontinued     1,000 mg 200 mL/hr over 60 Minutes Intravenous Every T-Th-Sa (Hemodialysis) 11/11/18 1906 11/11/18 2143   11/13/18 1200  ceFEPIme (MAXIPIME) 2 g in sodium chloride 0.9 % 100 mL IVPB  Status:  Discontinued     2 g 200 mL/hr over 30 Minutes Intravenous Every T-Th-Sa (Hemodialysis) 11/11/18 1906 11/11/18 2103   11/11/18 2300  vancomycin (VANCOCIN) 50 mg/mL oral solution 125 mg     125 mg Oral Every 12 hours 11/11/18 2201     11/11/18 2300  cefTRIAXone (ROCEPHIN) 2 g in sodium chloride 0.9 % 100 mL IVPB  Status:  Discontinued     2 g 200 mL/hr over 30 Minutes Intravenous Every 24 hours 11/11/18 2202 11/13/18 1314   11/11/18 1600  ceFEPIme (MAXIPIME) 2 g in sodium chloride 0.9 % 100 mL IVPB     2 g 200 mL/hr over 30 Minutes Intravenous  Once 11/11/18 1548 11/11/18 1744   11/11/18 1600  metroNIDAZOLE (FLAGYL) IVPB 500 mg  Status:  Discontinued     500 mg 100 mL/hr over 60 Minutes Intravenous Every 8 hours 11/11/18 1548 11/11/18 2103   11/11/18 1600  vancomycin (VANCOCIN) IVPB 1000 mg/200 mL premix  Status:  Discontinued     1,000 mg 200 mL/hr over 60 Minutes Intravenous  Once 11/11/18 1548 11/11/18 1556   11/11/18 1600  vancomycin (VANCOCIN) 2,000 mg in sodium chloride 0.9 % 500 mL IVPB     2,000 mg 250 mL/hr over 120 Minutes Intravenous  Once 11/11/18 1556 11/11/18 2103      Procedures:  None  CONSULTS:  pulmonary/intensive care and ID  Time spent: 25- minutes-Greater than 50% of this time was spent in counseling, explanation of diagnosis, planning of further management, and coordination of care.  MEDICATIONS: Scheduled Meds: . allopurinol  100 mg Oral Daily  . aspirin  324 mg Oral Daily  . Chlorhexidine Gluconate Cloth  6 each Topical Q0600  . ezetimibe  10 mg Oral Daily  . gabapentin  300 mg Oral QHS  . heparin  5,000 Units Subcutaneous Q8H  . hydrocortisone sod succinate (SOLU-CORTEF) inj  50 mg  Intravenous Q8H  . insulin aspart  0-9 Units Subcutaneous TID WC  . methimazole  2.5 mg Oral Daily  . midodrine  5 mg Oral TID WC  . vancomycin  125 mg Oral Q12H   Continuous Infusions: . sodium chloride Stopped (11/11/18 2355)   PRN Meds:.   PHYSICAL EXAM: Vital signs: Vitals:   11/12/18 2056 11/13/18 0500 11/13/18 0533 11/13/18 0720  BP: 132/81  131/61 (!) 161/78  Pulse: 71  68 67  Resp: 16  17 15   Temp: 98.2 F (36.8 C)  97.9 F (36.6 C) (!) 97.4 F (36.3 C)  TempSrc: Oral  Oral Oral  SpO2: 97%  96% 96%  Weight:  105.1 kg  104.1 kg  Height:       Filed Weights   11/12/18 0319 11/13/18 0500 11/13/18 0720  Weight: 102.2 kg 105.1 kg 104.1 kg   Body mass index is 30.28 kg/m.   General appearance:Awake, alert, not in any distress.  Eyes:no scleral icterus. HEENT: Atraumatic and Normocephalic Neck: supple Resp:Good air entry bilaterally,no rales or rhonchi CVS: S1 S2 regular GI: Bowel sounds present, Non tender and not distended with no gaurding, rigidity or rebound. Extremities: B/L Lower Ext shows no edema, both legs are warm to touch Neurology:  Non focal Psychiatric: Normal judgment and insight. Normal mood. Musculoskeletal:No digital cyanosis Skin:No Rash, warm and dry Wounds:N/A  I have personally reviewed following labs and imaging studies  LABORATORY DATA: CBC: Recent Labs  Lab 11/11/18 1701 11/12/18 0309 11/13/18 0357  WBC 8.2 18.7* 12.5*  NEUTROABS 7.5  --   --   HGB 13.1 11.4* 10.6*  HCT 43.0 37.4* 34.1*  MCV 98.9 96.4 96.3  PLT 233 216 546    Basic Metabolic Panel: Recent Labs  Lab 11/11/18 1701 11/12/18 0309 11/12/18 1557 11/13/18 0357  NA 139 138 141 140  K 5.4* 6.2* 5.5* 5.4*  CL 101 105 106 105  CO2 22 24 21* 18*  GLUCOSE 199* 212* 111* 110*  BUN 41* 43* 52* 56*  CREATININE 10.04* 9.73* 10.77* 11.60*  CALCIUM 8.2* 7.8* 8.3* 7.9*  MG  --  1.9  --   --   PHOS  --  3.4  --  5.3*    GFR: Estimated Creatinine Clearance: 7.6  mL/min (A) (by C-G formula based on SCr of 11.6 mg/dL (H)).  Liver Function Tests: Recent Labs  Lab 11/11/18 1701 11/12/18 0309 11/13/18 0357  AST 332* 224*  --   ALT 211* 230*  --   ALKPHOS 181* 170*  --   BILITOT 1.9* 2.4*  --   PROT 7.3 6.6  --   ALBUMIN 3.2* 2.8* 2.5*   No results for input(s): LIPASE, AMYLASE in the last 168 hours. No results for input(s): AMMONIA in the last 168 hours.  Coagulation Profile: No results for input(s): INR, PROTIME in the last 168 hours.  Cardiac Enzymes: No results for input(s): CKTOTAL, CKMB, CKMBINDEX,  TROPONINI in the last 168 hours.  BNP (last 3 results) No results for input(s): PROBNP in the last 8760 hours.  HbA1C: No results for input(s): HGBA1C in the last 72 hours.  CBG: Recent Labs  Lab 11/12/18 1112 11/12/18 1617 11/12/18 2042 11/13/18 0822 11/13/18 1151  GLUCAP 103* 113* 133* 101* 63*    Lipid Profile: No results for input(s): CHOL, HDL, LDLCALC, TRIG, CHOLHDL, LDLDIRECT in the last 72 hours.  Thyroid Function Tests: Recent Labs    11/11/18 2308  TSH 0.978    Anemia Panel: No results for input(s): VITAMINB12, FOLATE, FERRITIN, TIBC, IRON, RETICCTPCT in the last 72 hours.  Urine analysis:    Component Value Date/Time   COLORURINE LT. YELLOW 06/26/2013 1200   APPEARANCEUR CLEAR 06/26/2013 1200   LABSPEC 1.020 06/26/2013 1200   PHURINE 8.5 06/26/2013 1200   GLUCOSEU 100 06/26/2013 1200   HGBUR MODERATE 06/26/2013 1200   BILIRUBINUR NEGATIVE 06/26/2013 1200   KETONESUR NEGATIVE 06/26/2013 1200   PROTEINUR >300 (A) 01/04/2010 1032   UROBILINOGEN 0.2 06/26/2013 1200   NITRITE NEGATIVE 06/26/2013 1200   LEUKOCYTESUR TRACE 06/26/2013 1200    Sepsis Labs: Lactic Acid, Venous    Component Value Date/Time   LATICACIDVEN 1.5 11/12/2018 0309    MICROBIOLOGY: Recent Results (from the past 240 hour(s))  MRSA PCR Screening     Status: None   Collection Time: 11/11/18 10:12 PM  Result Value Ref Range  Status   MRSA by PCR NEGATIVE NEGATIVE Final    Comment:        The GeneXpert MRSA Assay (FDA approved for NASAL specimens only), is one component of a comprehensive MRSA colonization surveillance program. It is not intended to diagnose MRSA infection nor to guide or monitor treatment for MRSA infections. Performed at [facility], [place] [address], [place], [place] [address]   Blood Culture (routine x 2)     Status: None (Preliminary result)   Collection Time: 11/11/18 10:50 PM  Result Value Ref Range Status   Specimen Description BLOOD LEFT HAND  Final   Special Requests   Final    BOTTLES DRAWN AEROBIC ONLY Blood Culture results may not be optimal due to an inadequate volume of blood received in culture bottles   Culture   Final    NO GROWTH 1 DAY Performed at [facility], [place] [address] [place], [place] [address]    Report Status PENDING  Incomplete  Blood Culture (routine x 2)     Status: None (Preliminary result)   Collection Time: 11/11/18 11:10 PM  Result Value Ref Range Status   Specimen Description BLOOD LEFT WRIST  Final   Special Requests   Final    BOTTLES DRAWN AEROBIC ONLY Blood Culture results may not be optimal due to an inadequate volume of blood received in culture bottles   Culture   Final    NO GROWTH 1 DAY Performed at [facility], [place] [address] [place], [place] [address]    Report Status PENDING  Incomplete  C difficile quick scan w PCR reflex     Status: None   Collection Time: 11/12/18  4:51 PM  Result Value Ref Range Status   C Diff antigen NEGATIVE NEGATIVE Final   C Diff toxin NEGATIVE NEGATIVE Final   C Diff interpretation No C. difficile detected.  Final    Comment: Performed at [facility], [place] [address], [place], [place] [address]    RADIOLOGY STUDIES/RESULTS: Ct  Abdomen Pelvis W Contrast  Result Date: 11/11/2018 CLINICAL DATA:  Generalized abdomen pain with fever EXAM: CT ABDOMEN AND PELVIS WITH CONTRAST  TECHNIQUE: Multidetector CT imaging of the abdomen and pelvis was performed using the standard protocol following bolus administration of intravenous contrast. CONTRAST:  129mL OMNIPAQUE IOHEXOL 300 MG/ML  SOLN COMPARISON:  October 04, 2018 FINDINGS: Lower chest: The heart size is enlarged. Mild atelectasis of posterior lung bases are noted. Right lung base nodule 4 mm unchanged. Hepatobiliary: Mild diffuse low density of liver is identified. Patient status post prior cholecystectomy with intrahepatic biliary ductal dilatation and extrahepatic biliary ductal dilatation identified. Pneumobilia is noted. Pancreas: Unremarkable. No pancreatic ductal dilatation or surrounding inflammatory changes. Spleen: Normal in size without focal abnormality. Adrenals/Urinary Tract: The adrenal glands are normal. Bilateral kidney cysts are identified in atrophic kidneys bilaterally. Renal vascular calcifications are identified bilaterally. There is no hydronephrosis bilaterally. The bladder is decompressed limiting evaluation. Stomach/Bowel: Stomach is within normal limits. Appendix appears normal. No evidence of bowel wall thickening, distention, or inflammatory changes. Vascular/Lymphatic: Aortic atherosclerosis. No enlarged abdominal or pelvic lymph nodes. Reproductive: Surgical clips are identified in the prostate gland unchanged. Other: None. Musculoskeletal: Degenerative joint changes of the spine are noted. Patient status post prior posterior fusion of lower lumbar spine. IMPRESSION: No acute abnormality identified in the abdomen and pelvis. Fatty infiltration of liver. Status post prior cholecystectomy with intra and extrahepatic biliary ductal dilatation, postsurgical. Atrophic bilateral kidneys. Electronically Signed   By: Abelardo Diesel M.D.   On: 11/11/2018 19:58   Dg Chest Port 1 View  Result Date: 11/11/2018 CLINICAL DATA:  Fever and altered mental status EXAM: PORTABLE CHEST 1 VIEW COMPARISON:  October 03, 2018  FINDINGS: The heart size and mediastinal contours are stable. The aorta is tortuous. There is mild central pulmonary vascular congestion. There is no frank pulmonary edema, focal pneumonia or pleural effusion. The visualized skeletal structures are stable. IMPRESSION: Mild central pulmonary vascular congestion. Electronically Signed   By: Abelardo Diesel M.D.   On: 11/11/2018 16:08     LOS: 2 days   Oren Binet, MD  [facility]  If 7PM-7AM, please contact night-coverage  Please page via [email or web address]  Go to [email or web address] and use [facility]'s universal password to access. If you do not have the password, please contact the hospital operator.  Locate the [facility] provider you are looking for under [facility] and page to a number that you can be directly reached. If you still have difficulty reaching the provider, please page the [facility] (Director on Call) for the Hospitalists listed on amion for assistance.  11/13/2018, 1:48 PM

## 2018-11-13 NOTE — Progress Notes (Signed)
Patient brought to dialysis for treatment. Difficulty cannulating arterial needle. Cannulated x3 with dark blood/clot return. Needles removed. Cannuated by a second RN with same result x2. Dr. Jonnie Finner notified and reported am lab results. Order to send patient back to room and will need fistulogram. No changes from previous assessment. Report called to primary RN.

## 2018-11-13 NOTE — Progress Notes (Signed)
[facility] for Infectious Disease  Date of Admission:  11/11/2018     Total days of antibiotics 3         ASSESSMENT/PLAN  Mr. Grobe was admitted with fevers which have since resolved with no clear origin. He does have significant history for re-current Klebsiella bacteremia, however his blood cultures at present are without growth. He has remained afebrile for 24 hours now and WBC count is improved. There is question if his gall bladder could potentially be the source of the infection with consideration for MRCP. He did have complications with fistula today and nephology is investigating. Will deescalate antimicrobial therapy to Cefazolin for now pending culture results. New C. Diff testing is negative, however he remains on vancomycin. Will continue enteric precautions at this time. Can consider discontinuation of oral vancomycin in the next 24 hours.   1. Discontinue IV vancomycin and change ceftriaxone to Ancef.  2. Monitor cultures.  3. Continue enteric precautions.    Active Problems:   Septic shock ([place])   . allopurinol  100 mg Oral Daily  . aspirin  324 mg Oral Daily  . Chlorhexidine Gluconate Cloth  6 each Topical Q0600  . ezetimibe  10 mg Oral Daily  . gabapentin  300 mg Oral QHS  . heparin  5,000 Units Subcutaneous Q8H  . hydrocortisone sod succinate (SOLU-CORTEF) inj  50 mg Intravenous Q8H  . insulin aspart  0-9 Units Subcutaneous TID WC  . methimazole  2.5 mg Oral Daily  . midodrine  5 mg Oral TID WC  . vancomycin  125 mg Oral Q12H    SUBJECTIVE:  Afebrile overnight with no acute events. WBC count improved to 12.5. Blood cultures remain without growth to date and C. Diff testing is negative.   Frustrated why infection cannot be found and ready to go home.   Allergies  Allergen Reactions  . Statins Other (See Comments)    Weak muscles  . Ciprofloxacin Rash     Review of Systems: Review of Systems  Constitutional: Negative for chills, fever and  weight loss.  Respiratory: Negative for cough, shortness of breath and wheezing.   Cardiovascular: Negative for chest pain and leg swelling.  Gastrointestinal: Negative for abdominal pain, constipation, diarrhea, nausea and vomiting.  Skin: Negative for rash.      OBJECTIVE: Vitals:   11/12/18 2056 11/13/18 0500 11/13/18 0533 11/13/18 0720  BP: 132/81  131/61 (!) 161/78  Pulse: 71  68 67  Resp: 16  17 15   Temp: 98.2 F (36.8 C)  97.9 F (36.6 C) (!) 97.4 F (36.3 C)  TempSrc: Oral  Oral Oral  SpO2: 97%  96% 96%  Weight:  105.1 kg  104.1 kg  Height:       Body mass index is 30.28 kg/m.  Physical Exam Constitutional:      General: He is not in acute distress.    Appearance: He is well-developed.  Cardiovascular:     Rate and Rhythm: Normal rate and regular rhythm.     Heart sounds: Normal heart sounds.     Comments: Right AV fistula appears intact with positive bruit and thrill and without signs of infection.  Pulmonary:     Effort: Pulmonary effort is normal.     Breath sounds: Normal breath sounds.  Abdominal:     General: Bowel sounds are normal. There is no distension.     Palpations: There is no mass.     Tenderness: There is no abdominal tenderness.  There is no guarding.  Skin:    General: Skin is warm and dry.  Neurological:     Mental Status: He is alert.    Lab Results Lab Results  Component Value Date   WBC 12.5 (H) 11/13/2018   HGB 10.6 (L) 11/13/2018   HCT 34.1 (L) 11/13/2018   MCV 96.3 11/13/2018   PLT 189 11/13/2018    Lab Results  Component Value Date   CREATININE 11.60 (H) 11/13/2018   BUN 56 (H) 11/13/2018   NA 140 11/13/2018   K 5.4 (H) 11/13/2018   CL 105 11/13/2018   CO2 18 (L) 11/13/2018    Lab Results  Component Value Date   ALT 230 (H) 11/12/2018   AST 224 (H) 11/12/2018   ALKPHOS 170 (H) 11/12/2018   BILITOT 2.4 (H) 11/12/2018     Microbiology: Recent Results (from the past 240 hour(s))  MRSA PCR Screening     Status:  None   Collection Time: 11/11/18 10:12 PM  Result Value Ref Range Status   MRSA by PCR NEGATIVE NEGATIVE Final    Comment:        The GeneXpert MRSA Assay (FDA approved for NASAL specimens only), is one component of a comprehensive MRSA colonization surveillance program. It is not intended to diagnose MRSA infection nor to guide or monitor treatment for MRSA infections. Performed at [facility], [place] [address], [place], [name] [address]   Blood Culture (routine x 2)     Status: None (Preliminary result)   Collection Time: 11/11/18 10:50 PM  Result Value Ref Range Status   Specimen Description BLOOD LEFT HAND  Final   Special Requests   Final    BOTTLES DRAWN AEROBIC ONLY Blood Culture results may not be optimal due to an inadequate volume of blood received in culture bottles   Culture   Final    NO GROWTH 1 DAY Performed at [facility], [place] [name] [place], [place] [address]    Report Status PENDING  Incomplete  Blood Culture (routine x 2)     Status: None (Preliminary result)   Collection Time: 11/11/18 11:10 PM  Result Value Ref Range Status   Specimen Description BLOOD LEFT WRIST  Final   Special Requests   Final    BOTTLES DRAWN AEROBIC ONLY Blood Culture results may not be optimal due to an inadequate volume of blood received in culture bottles   Culture   Final    NO GROWTH 1 DAY Performed at [facility], [place] [address] [place], [place] [address]    Report Status PENDING  Incomplete  C difficile quick scan w PCR reflex     Status: None   Collection Time: 11/12/18  4:51 PM  Result Value Ref Range Status   C Diff antigen NEGATIVE NEGATIVE Final   C Diff toxin NEGATIVE NEGATIVE Final   C Diff interpretation No C. difficile detected.  Final    Comment: Performed at [facility], [place] [address], [place], [place] [address]     Terri Piedra, [place] [facility] [phone]  Pager  11/13/2018  12:32 PM

## 2018-11-13 NOTE — Progress Notes (Addendum)
Pharmacy Antibiotic Note  [name] is a 68 y.o. male admitted on 11/11/2018 with fever of unknown origin.  Patient does have significant history for re-current Klebsiella bacteremia, but blood cultures this admission have no growth. There is no clearly identifiable source of infection, but could possibly be gallbladder per ID. Patient had been on ceftriaxone. Pharmacy has been consulted for cefazolin dosing for further antibiotic de-escalation. Patient also continues on PO vancomycin taper for prior C. diff infection.   WBC down to 12.5 (pt also currently receiving stress dose steroids) and pt remains afebrile. Of note, patient is ESRD on HD TTS. Last HD 2/11. Was scheduled for HD today, but could not stick his AVF so HD was not done. Per nephrology, pt will be seen by IR for possible fistulogram intervention.  Plan: Stop Ceftriaxone Start Cefazolin 1 g IV q24h Monitor clinical picture, HD schedule, vanc levels prn F/U C&S, abx deescalation / LOT  Height: 6\' 1"  (185.4 cm) Weight: 229 lb 8 oz (104.1 kg) IBW/kg (Calculated) : 79.9  Temp (24hrs), Avg:97.9 F (36.6 C), Min:97.4 F (36.3 C), Max:98.2 F (36.8 C)  Recent Labs  Lab 11/11/18 1701 11/11/18 2308 11/12/18 0309 11/12/18 1557 11/13/18 0357  WBC 8.2  --  18.7*  --  12.5*  CREATININE 10.04*  --  9.73* 10.77* 11.60*  LATICACIDVEN  --  2.7* 1.5  --   --     Estimated Creatinine Clearance: 7.6 mL/min (A) (by C-G formula based on SCr of 11.6 mg/dL (H)).    Allergies  Allergen Reactions  . Statins Other (See Comments)    Weak muscles  . Ciprofloxacin Rash   Antimicrobials This Admission: Cefazolin 2/14 >> PO vancomycin 2/11>> Ceftriaxone 2/11>> 2/13 IV Vancomycin x 1 on 2/11  Microbiology Results: 2/11 BCx>> NGTD 2/11 MRSA PCR >> Negative 2/12 C. diff >> Negative   Thank you for allowing pharmacy to be a part of this patient's care.  Jackson Latino, PharmD PGY1 Pharmacy Resident Phone [phone] 11/13/2018      2:18 PM

## 2018-11-13 NOTE — Progress Notes (Signed)
Pt refusing SCD's and afternoon heparin. Writer educated on use.

## 2018-11-13 NOTE — Progress Notes (Signed)
[facility] Progress Note  Subjective: unable to stick AVF (1 needle only) today, did not get any HD  Vitals:   11/12/18 2056 11/13/18 0500 11/13/18 0533 11/13/18 0720  BP: 132/81  131/61 (!) 161/78  Pulse: 71  68 67  Resp: 16  17 15   Temp: 98.2 F (36.8 C)  97.9 F (36.6 C) (!) 97.4 F (36.3 C)  TempSrc: Oral  Oral Oral  SpO2: 97%  96% 96%  Weight:  105.1 kg  104.1 kg  Height:        Inpatient medications: . allopurinol  100 mg Oral Daily  . aspirin  324 mg Oral Daily  . Chlorhexidine Gluconate Cloth  6 each Topical Q0600  . ezetimibe  10 mg Oral Daily  . gabapentin  100 mg Oral QHS  . heparin  5,000 Units Subcutaneous Q8H  . hydrocortisone sod succinate (SOLU-CORTEF) inj  50 mg Intravenous Q8H  . insulin aspart  0-9 Units Subcutaneous TID WC  . methimazole  2.5 mg Oral Daily  . midodrine  5 mg Oral TID WC  . vancomycin  125 mg Oral Q12H   . sodium chloride Stopped (11/11/18 2355)  . cefTRIAXone (ROCEPHIN)  IV 2 g (11/13/18 0038)     Iron/TIBC/Ferritin/ %Sat    Component Value Date/Time   IRON 16 (L) 06/12/2018 0500   TIBC 168 (L) 06/12/2018 0500   FERRITIN 709 (H) 10/17/2016 1103   IRONPCTSAT 10 (L) 06/12/2018 0500    Exam: Gen alert, no distress, calm No jvd or bruits Chest clear bilat to bases RRR no MRG Abd soft ntnd no mass or ascites +bs MS no joint effusions or deformity Ext no LE edema Neuro is alert, Ox 3 , nf RUA AVF +bruit, no drainage/ ulcers/ bleeding   Home meds:  - alirocumab 150mg  every 2 wks  - allopurinol 100 qd/ colchicine 0.6 qd/ ferric citrate 420mg  tid ac  - pantoprazole 40 hs/ methimazole 2.5 mg qd/ sensipar 90 qd  - welchol 1875mg  bid ac/ ezetimibe 10 qd/ aspirin 325 qd  - citalopram 10 qd/ zolpidem 5mg  hs prn  - po vancomycin 125mg  qid    [facility] TTS  4h  97.5kg   2/2.25 bath  P2  Hep none  RUA AVF  - hect 2 ug  - no esa   Assessment: 1. Fevers r/o sepsis: blood cx's and Cdif neg so  far 2. ^LFT's 3. H/o recurrent Klebsiella sepsis 4. ESRD on HD: AVF malfunction today, will consult IR for fistulogram +/- intervention. Had recent intervention at [facility].  5. Volume: up 5-6 kg by wts, not symptomatic 6. Hypotension - resolved 7. HL on sq Rx 8. MBD of ckd - cont meds 9. Anemia of ckd - no esa at OP center  P: 1. As above      [facility] 11/13/2018, 11:14 AM  Recent Labs  Lab 11/12/18 0309 11/12/18 1557 11/13/18 0357  NA 138 141 140  K 6.2* 5.5* 5.4*  CL 105 106 105  CO2 24 21* 18*  GLUCOSE 212* 111* 110*  BUN 43* 52* 56*  CREATININE 9.73* 10.77* 11.60*  CALCIUM 7.8* 8.3* 7.9*  PHOS 3.4  --  5.3*  ALBUMIN 2.8*  --  2.5*   Recent Labs  Lab 11/11/18 1701 11/12/18 0309  AST 332* 224*  ALT 211* 230*  ALKPHOS 181* 170*  BILITOT 1.9* 2.4*  PROT 7.3 6.6   Recent Labs  Lab 11/11/18 1701 11/12/18 0309  11/13/18 0357  WBC 8.2 18.7* 12.5*  NEUTROABS 7.5  --   --   HGB 13.1 11.4* 10.6*  HCT 43.0 37.4* 34.1*  MCV 98.9 96.4 96.3  PLT 233 216 189

## 2018-11-13 NOTE — Progress Notes (Signed)
Patient places self on CPAP.  RT added sterile water in chamber.

## 2018-11-14 ENCOUNTER — Inpatient Hospital Stay (HOSPITAL_COMMUNITY): Payer: Medicare Other

## 2018-11-14 ENCOUNTER — Encounter (HOSPITAL_COMMUNITY): Payer: Self-pay | Admitting: Interventional Radiology

## 2018-11-14 ENCOUNTER — Inpatient Hospital Stay (HOSPITAL_COMMUNITY): Payer: Medicare Other | Admitting: Certified Registered"

## 2018-11-14 ENCOUNTER — Encounter (HOSPITAL_COMMUNITY)
Admission: EM | Disposition: A | Discharge status: Home / Self Care | Payer: Self-pay | Source: Home / Self Care | Attending: Internal Medicine

## 2018-11-14 HISTORY — PX: ERCP: SHX5425

## 2018-11-14 HISTORY — PX: IR US GUIDE VASC ACCESS RIGHT: IMG2390

## 2018-11-14 HISTORY — PX: REMOVAL OF STONES: SHX5545

## 2018-11-14 HISTORY — PX: SPHINCTEROTOMY: SHX5544

## 2018-11-14 HISTORY — PX: ESOPHAGOGASTRODUODENOSCOPY: SHX5428

## 2018-11-14 HISTORY — PX: BALLOON DILATION: SHX5330

## 2018-11-14 HISTORY — PX: IR AV DIALY SHUNT INTRO NEEDLE/INTRACATH INITIAL W/PTA/IMG RIGHT: IMG6116

## 2018-11-14 LAB — HEPATIC FUNCTION PANEL
ALT: 95 U/L — ABNORMAL HIGH (ref 0–44)
AST: 31 U/L (ref 15–41)
Albumin: 2.5 g/dL — ABNORMAL LOW (ref 3.5–5.0)
Alkaline Phosphatase: 126 U/L (ref 38–126)
BILIRUBIN TOTAL: 1 mg/dL (ref 0.3–1.2)
Bilirubin, Direct: 0.2 mg/dL (ref 0.0–0.2)
Indirect Bilirubin: 0.8 mg/dL (ref 0.3–0.9)
Total Protein: 6.5 g/dL (ref 6.5–8.1)

## 2018-11-14 LAB — GLUCOSE, CAPILLARY
Glucose-Capillary: 74 mg/dL (ref 70–99)
Glucose-Capillary: 63 mg/dL — ABNORMAL LOW (ref 70–99)
Glucose-Capillary: 70 mg/dL (ref 70–99)
Glucose-Capillary: 57 mg/dL — ABNORMAL LOW (ref 70–99)
Glucose-Capillary: 90 mg/dL (ref 70–99)
Glucose-Capillary: 75 mg/dL (ref 70–99)
Glucose-Capillary: 80 mg/dL (ref 70–99)
Glucose-Capillary: 101 mg/dL — ABNORMAL HIGH (ref 70–99)

## 2018-11-14 LAB — RENAL FUNCTION PANEL
Albumin: 2.6 g/dL — ABNORMAL LOW (ref 3.5–5.0)
Anion gap: 15 (ref 5–15)
BUN: 72 mg/dL — AB (ref 8–23)
CO2: 19 mmol/L — ABNORMAL LOW (ref 22–32)
Calcium: 7.9 mg/dL — ABNORMAL LOW (ref 8.9–10.3)
Chloride: 107 mmol/L (ref 98–111)
Creatinine, Ser: 13.15 mg/dL — ABNORMAL HIGH (ref 0.61–1.24)
GFR calc Af Amer: 4 mL/min — ABNORMAL LOW (ref >60)
GFR calc non Af Amer: 3 mL/min — ABNORMAL LOW (ref >60)
Glucose, Bld: 97 mg/dL (ref 70–99)
Phosphorus: 7.1 mg/dL — ABNORMAL HIGH (ref 2.5–4.6)
Potassium: 5.1 mmol/L (ref 3.5–5.1)
Sodium: 141 mmol/L (ref 135–145)

## 2018-11-14 LAB — CBC
HEMATOCRIT: 35.6 % — AB (ref 39.0–52.0)
Hemoglobin: 11 g/dL — ABNORMAL LOW (ref 13.0–17.0)
MCH: 29.1 pg (ref 26.0–34.0)
MCHC: 30.9 g/dL (ref 30.0–36.0)
MCV: 94.2 fL (ref 80.0–100.0)
Platelets: 198 10*3/uL (ref 150–400)
RBC: 3.78 MIL/uL — ABNORMAL LOW (ref 4.22–5.81)
RDW: 18.8 % — ABNORMAL HIGH (ref 11.5–15.5)
WBC: 8.5 10*3/uL (ref 4.0–10.5)
nRBC: 0 % (ref 0.0–0.2)

## 2018-11-14 LAB — PROTIME-INR
INR: 0.99
Prothrombin Time: 13 seconds (ref 11.4–15.2)

## 2018-11-14 LAB — STRONGYLOIDES, AB, IGG: Strongyloides, Ab, IgG: NEGATIVE

## 2018-11-14 SURGERY — ERCP, WITH INTERVENTION IF INDICATED
Anesthesia: General

## 2018-11-14 MED ORDER — ONDANSETRON HCL 4 MG/2ML IJ SOLN
INTRAMUSCULAR | Status: DC | PRN
  Administered 2018-11-14: 4 mg via INTRAVENOUS

## 2018-11-14 MED ORDER — GLUCAGON HCL RDNA (DIAGNOSTIC) 1 MG IJ SOLR
INTRAMUSCULAR | Status: DC | PRN
  Administered 2018-11-14: 1 mg via INTRAVENOUS

## 2018-11-14 MED ORDER — CISATRACURIUM BOLUS VIA INFUSION
20.0000 mg | Freq: Once | INTRAVENOUS | Status: AC
  Administered 2018-11-14: 10 mg via INTRAVENOUS
  Filled 2018-11-14: qty 20

## 2018-11-14 MED ORDER — TRIAMCINOLONE ACETONIDE 0.5 % EX CREA
TOPICAL_CREAM | Freq: Two times a day (BID) | CUTANEOUS | Status: DC | PRN
  Filled 2018-11-14: qty 15

## 2018-11-14 MED ORDER — DEXAMETHASONE SODIUM PHOSPHATE 10 MG/ML IJ SOLN
INTRAMUSCULAR | Status: DC | PRN
  Administered 2018-11-14: 10 mg via INTRAVENOUS

## 2018-11-14 MED ORDER — DEXTROSE 50 % IV SOLN
INTRAVENOUS | Status: DC | PRN
  Administered 2018-11-14: 25 mL via INTRAVENOUS

## 2018-11-14 MED ORDER — HYDROCORTISONE NA SUCCINATE PF 100 MG IJ SOLR
25.0000 mg | Freq: Two times a day (BID) | INTRAMUSCULAR | Status: DC
  Administered 2018-11-14 – 2018-11-15 (×2): 25 mg via INTRAVENOUS
  Filled 2018-11-14 (×3): qty 2

## 2018-11-14 MED ORDER — POLYVINYL ALCOHOL 1.4 % OP SOLN
1.0000 [drp] | Freq: Three times a day (TID) | OPHTHALMIC | Status: DC | PRN
  Filled 2018-11-14: qty 15

## 2018-11-14 MED ORDER — GLYCOPYRROLATE PF 0.2 MG/ML IJ SOSY
PREFILLED_SYRINGE | INTRAMUSCULAR | Status: DC | PRN
  Administered 2018-11-14: 0.4 mg via INTRAVENOUS

## 2018-11-14 MED ORDER — LIDOCAINE 2% (20 MG/ML) 5 ML SYRINGE
INTRAMUSCULAR | Status: DC | PRN
  Administered 2018-11-14: 60 mg via INTRAVENOUS

## 2018-11-14 MED ORDER — ALLOPURINOL 100 MG PO TABS: 100.0000 mg | ORAL_TABLET | Freq: Every day | ORAL | Status: DC

## 2018-11-14 MED ORDER — RENA-VITE PO TABS: 1.0000 | ORAL_TABLET | Freq: Every day | ORAL | Status: DC

## 2018-11-14 MED ORDER — INDOMETHACIN 50 MG RE SUPP
RECTAL | Status: AC
  Filled 2018-11-14: qty 2

## 2018-11-14 MED ORDER — ASPIRIN EC 325 MG PO TBEC
325.0000 mg | DELAYED_RELEASE_TABLET | Freq: Every day | ORAL | Status: DC
  Administered 2018-11-15 – 2018-11-16 (×2): 325 mg via ORAL
  Filled 2018-11-14 (×2): qty 1

## 2018-11-14 MED ORDER — DOXERCALCIFEROL 4 MCG/2ML IV SOLN
2.0000 ug | INTRAVENOUS | Status: DC
  Administered 2018-11-16: 2 ug via INTRAVENOUS
  Filled 2018-11-14: qty 2

## 2018-11-14 MED ORDER — PANTOPRAZOLE SODIUM 40 MG PO TBEC: 40.0000 mg | DELAYED_RELEASE_TABLET | Freq: Every day | ORAL | Status: DC

## 2018-11-14 MED ORDER — FENTANYL CITRATE (PF) 100 MCG/2ML IJ SOLN
INTRAMUSCULAR | Status: AC | PRN
  Administered 2018-11-14: 50 ug via INTRAVENOUS
  Administered 2018-11-14: 25 ug via INTRAVENOUS

## 2018-11-14 MED ORDER — ASPIRIN 81 MG PO CHEW
324.0000 mg | CHEWABLE_TABLET | Freq: Every day | ORAL | Status: DC
  Filled 2018-11-14: qty 4

## 2018-11-14 MED ORDER — FERRIC CITRATE 1 GM 210 MG(FE) PO TABS
420.0000 mg | ORAL_TABLET | Freq: Three times a day (TID) | ORAL | Status: DC
  Administered 2018-11-15 – 2018-11-16 (×4): 420 mg via ORAL
  Filled 2018-11-14 (×4): qty 2

## 2018-11-14 MED ORDER — PROPOFOL 10 MG/ML IV BOLUS
INTRAVENOUS | Status: DC | PRN
  Administered 2018-11-14: 150 mg via INTRAVENOUS

## 2018-11-14 MED ORDER — FENTANYL CITRATE (PF) 100 MCG/2ML IJ SOLN
INTRAMUSCULAR | Status: DC | PRN
  Administered 2018-11-14: 100 ug via INTRAVENOUS

## 2018-11-14 MED ORDER — GLUCAGON HCL RDNA (DIAGNOSTIC) 1 MG IJ SOLR
INTRAMUSCULAR | Status: AC
  Filled 2018-11-14: qty 1

## 2018-11-14 MED ORDER — ACETAMINOPHEN 325 MG PO TABS: 650.0000 mg | ORAL_TABLET | Freq: Four times a day (QID) | ORAL | Status: DC | PRN

## 2018-11-14 MED ORDER — DEXTROSE 50 % IV SOLN
INTRAVENOUS | Status: AC
  Administered 2018-11-14: 50 mL
  Filled 2018-11-14: qty 50

## 2018-11-14 MED ORDER — IOPAMIDOL (ISOVUE-300) INJECTION 61%
INTRAVENOUS | Status: AC
  Filled 2018-11-14: qty 100

## 2018-11-14 MED ORDER — PRALUENT 150 MG/ML ~~LOC~~ SOPN: 150.0000 mg | PEN_INJECTOR | SUBCUTANEOUS | Status: DC

## 2018-11-14 MED ORDER — EZETIMIBE 10 MG PO TABS: 10.0000 mg | ORAL_TABLET | Freq: Every day | ORAL | Status: DC

## 2018-11-14 MED ORDER — CINACALCET HCL 30 MG PO TABS
90.0000 mg | ORAL_TABLET | Freq: Two times a day (BID) | ORAL | Status: DC
  Administered 2018-11-15 (×2): 90 mg via ORAL
  Filled 2018-11-14 (×2): qty 3

## 2018-11-14 MED ORDER — LIDOCAINE HCL 1 % IJ SOLN
INTRAMUSCULAR | Status: AC
  Filled 2018-11-14: qty 20

## 2018-11-14 MED ORDER — CHLORHEXIDINE GLUCONATE CLOTH 2 % EX PADS: 6.0000 | MEDICATED_PAD | Freq: Every day | CUTANEOUS | Status: DC

## 2018-11-14 MED ORDER — DIPHENHYDRAMINE HCL 50 MG/ML IJ SOLN
INTRAMUSCULAR | Status: AC | PRN
  Administered 2018-11-14: 25 mg via INTRAVENOUS

## 2018-11-14 MED ORDER — NEPRO/CARBSTEADY PO LIQD
237.0000 mL | Freq: Two times a day (BID) | ORAL | Status: DC
  Administered 2018-11-15 – 2018-11-16 (×3): 237 mL via ORAL

## 2018-11-14 MED ORDER — SODIUM CHLORIDE 0.9 % IV SOLN: INTRAVENOUS | Status: DC

## 2018-11-14 MED ORDER — IOPAMIDOL (ISOVUE-300) INJECTION 61%
INTRAVENOUS | Status: AC
  Administered 2018-11-14: 60 mL
  Filled 2018-11-14: qty 100

## 2018-11-14 MED ORDER — SODIUM CHLORIDE 0.9 % IV SOLN
INTRAVENOUS | Status: DC | PRN
  Administered 2018-11-14: 50 ug/min via INTRAVENOUS

## 2018-11-14 MED ORDER — POLYETHYLENE GLYCOL 3350 17 G PO PACK: 17.0000 g | PACK | Freq: Every day | ORAL | Status: DC | PRN

## 2018-11-14 MED ORDER — MIDAZOLAM HCL 2 MG/2ML IJ SOLN
INTRAMUSCULAR | Status: AC
  Filled 2018-11-14: qty 4

## 2018-11-14 MED ORDER — DIPHENHYDRAMINE HCL 50 MG/ML IJ SOLN
INTRAMUSCULAR | Status: AC
  Filled 2018-11-14: qty 1

## 2018-11-14 MED ORDER — NEOSTIGMINE METHYLSULFATE 5 MG/5ML IV SOSY
PREFILLED_SYRINGE | INTRAVENOUS | Status: DC | PRN
  Administered 2018-11-14: 3 mg via INTRAVENOUS

## 2018-11-14 MED ORDER — MIDAZOLAM HCL 2 MG/2ML IJ SOLN
INTRAMUSCULAR | Status: AC | PRN
  Administered 2018-11-14: 0.5 mg via INTRAVENOUS
  Administered 2018-11-14: 1 mg via INTRAVENOUS

## 2018-11-14 MED ORDER — PANTOPRAZOLE SODIUM 40 MG PO TBEC
40.0000 mg | DELAYED_RELEASE_TABLET | Freq: Every day | ORAL | Status: DC
  Administered 2018-11-14 – 2018-11-16 (×3): 40 mg via ORAL
  Filled 2018-11-14 (×3): qty 1

## 2018-11-14 MED ORDER — PHENYLEPHRINE 40 MCG/ML (10ML) SYRINGE FOR IV PUSH (FOR BLOOD PRESSURE SUPPORT)
PREFILLED_SYRINGE | INTRAVENOUS | Status: DC | PRN
  Administered 2018-11-14: 80 ug via INTRAVENOUS
  Administered 2018-11-14: 160 ug via INTRAVENOUS

## 2018-11-14 MED ORDER — LIDOCAINE HCL (PF) 1 % IJ SOLN
INTRAMUSCULAR | Status: AC | PRN
  Administered 2018-11-14: 5 mL

## 2018-11-14 MED ORDER — ZOLPIDEM TARTRATE 5 MG PO TABS: 5.0000 mg | ORAL_TABLET | Freq: Every evening | ORAL | Status: DC | PRN

## 2018-11-14 MED ORDER — RENA-VITE PO TABS
1.0000 | ORAL_TABLET | Freq: Every day | ORAL | Status: DC
  Administered 2018-11-14 – 2018-11-15 (×2): 1 via ORAL
  Filled 2018-11-14 (×2): qty 1

## 2018-11-14 MED ORDER — SODIUM CHLORIDE 0.9 % IV SOLN
INTRAVENOUS | Status: DC | PRN
  Administered 2018-11-14: 35 mL

## 2018-11-14 MED ORDER — FENTANYL CITRATE (PF) 100 MCG/2ML IJ SOLN
INTRAMUSCULAR | Status: AC
  Filled 2018-11-14: qty 4

## 2018-11-14 MED ORDER — DEXTROSE 50 % IV SOLN
12.5000 g | INTRAVENOUS | Status: AC
  Administered 2018-11-14: 12.5 g via INTRAVENOUS

## 2018-11-14 MED ORDER — METHIMAZOLE 5 MG PO TABS: 2.5000 mg | ORAL_TABLET | Freq: Every day | ORAL | Status: DC

## 2018-11-14 MED ORDER — DEXTROSE 50 % IV SOLN
INTRAVENOUS | Status: AC
  Filled 2018-11-14: qty 50

## 2018-11-14 MED ORDER — VANCOMYCIN HCL 125 MG PO CAPS: 125.0000 mg | ORAL_CAPSULE | Freq: Two times a day (BID) | ORAL | Status: DC

## 2018-11-14 MED ORDER — ACETAMINOPHEN-CODEINE #3 300-30 MG PO TABS: 1.0000 | ORAL_TABLET | Freq: Four times a day (QID) | ORAL | Status: DC | PRN

## 2018-11-14 NOTE — Transfer of Care (Signed)
Immediate Anesthesia Transfer of Care Note  Patient: [name]  Procedure(s) Performed: ENDOSCOPIC RETROGRADE CHOLANGIOPANCREATOGRAPHY (ERCP) (N/A ) BALLOON DILATION (N/A )  Patient Location: Endoscopy Unit  Anesthesia Type:General  Level of Consciousness: awake and patient cooperative  Airway & Oxygen Therapy: Patient Spontanous Breathing  Post-op Assessment: Report given to RN and Post -op Vital signs reviewed and stable  Post vital signs: Reviewed and stable  Last Vitals:  Vitals Value Taken Time  BP    Temp    Pulse 57 11/14/2018  3:43 PM  Resp 14 11/14/2018  3:43 PM  SpO2 100 % 11/14/2018  3:43 PM  Vitals shown include unvalidated device data.  Last Pain:  Vitals:   11/14/18 1345  TempSrc: Oral  PainSc: 0-No pain         Complications: No apparent anesthesia complications

## 2018-11-14 NOTE — Consult Note (Signed)
Chief Complaint: Patient was seen in consultation today for right arm fistula evaluation and possible intervention Chief Complaint  Patient presents with  . Altered Mental Status  . Fever   at the request of Dr Mickel Crow  Supervising Physician: Corrie Mckusick  Patient Status: [facility] - In-pt  History of Present Illness: [name] is a 68 y.o. male   Attempted dialysis yesterday-- Unable to dialyze at all Cannulization difficult  Request made for fistulogram with possible intervention Last intervention last week per pt at [facility] 2009- last use 2/11-- no problems  I can feel pulse-- no thrill   Past Medical History:  Diagnosis Date  . Acute encephalopathy 10/04/2018  . Allergic rhinitis, cause unspecified 02/24/2014  . Anemia 06/16/2011  . BENIGN PROSTATIC HYPERTROPHY 10/14/2009  . CAD, NATIVE VESSEL 02/06/2009   a. 06/2007 s/p Taxus DES to the RCA;  b. 08/2016 NSTEMI in setting of SVT/PCI: LM 30ost, LAD 58m (3.0x16 Synergy DES), LCX 26m, OM1 60, RI 40, RCA 70p/m, 65m - not amenable to PCI.  [name] Cervical radiculopathy, chronic 02/23/2016   Right c5-6 by NCS/EMG  . Chest pain 08/09/2015  . Chronic combined systolic (congestive) and diastolic (congestive) heart failure ([place])    a. 10/2016 Echo: EF 40-45%, Gr2 DD. mildly dil LA.  [name] COLONIC POLYPS, HX OF 10/14/2009  . Dementia ([place]) [id number]  . Depression 09/24/2015  . DIABETES MELLITUS, TYPE II 02/01/2010  . DYSLIPIDEMIA 06/18/2007  . ESRD (end stage renal disease) on dialysis ([place]) 08/04/2010   ESRD due to DM adn HTN, started HD in 2011. As of 2019 has a R arm graft and gets HD on TTS sched  . FOOT PAIN 08/12/2008  . GAIT DISTURBANCE 03/03/2010  . GASTROENTERITIS, VIRAL 10/14/2009  . GERD 06/18/2007  . GOITER, MULTINODULAR 12/26/2007  . GOUT 06/18/2007  . GYNECOMASTIA 07/17/2010  . Hemodialysis access, fistula mature [facility])    Dialysis T-Th-Sa ([place]) Right upper arm fistula  . Hyperlipidemia  10/16/2011  . Hyperparathyroidism, secondary ([place]) 06/16/2011  . HYPERTENSION 06/18/2007  . Hyperthyroidism   . Hypocalcemia 06/07/2010  . Ischemic cardiomyopathy    a. 10/2016 Echo: EF 40-45%.  . Lumbar stenosis with neurogenic claudication   . ONYCHOMYCOSIS, TOENAILS 12/26/2007  . OSA on CPAP 10/16/2011  . Other malaise and fatigue 11/24/2009  . PERIPHERAL NEUROPATHY 06/18/2007  . Prostate cancer ([place])   . PSVT (paroxysmal supraventricular tachycardia) ([place])    a. 50/5397 complicated by NSTEMI;  b. 11/2016 Treated w/ adenosine in ED;  c. 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  [name] PULMONARY NODULE, RIGHT LOWER LOBE 06/08/2009  . Sleep apnea    cpap machine and o2  . TRANSAMINASES, SERUM, ELEVATED 02/01/2010  . Transfusion history    none recent  . Unspecified hypotension 01/30/2010    Past Surgical History:  Procedure Laterality Date  . A/V SHUNTOGRAM N/A 04/07/2018   Procedure: A/V SHUNTOGRAM;  Surgeon: Waynetta Sandy, MD;  Location: [place] CV [facility];  Service: Cardiovascular;  Laterality: N/A;  . ARTERIOVENOUS GRAFT PLACEMENT Right 2009   forearm/notes 02/01/2011  . AV FISTULA PLACEMENT  11/07/2011   Procedure: INSERTION OF ARTERIOVENOUS (AV) GORE-TEX GRAFT ARM;  Surgeon: Tinnie Gens, MD;  Location: [place];  Service: Vascular;  Laterality: Left;  . BACK SURGERY  1998  . BASCILIC VEIN TRANSPOSITION Right 02/27/2013   Procedure: BASCILIC VEIN TRANSPOSITION;  Surgeon: Mal Misty, MD;  Location: [place];  Service: Vascular;  Laterality: Right;  Right Basilic Vein Transposition   . CARDIAC CATHETERIZATION N/A 08/06/2016   Procedure: Left Heart Cath and Coronary Angiography;  Surgeon: Jolaine Artist, MD;  Location: [place] [facility];  Service: Cardiovascular;  Laterality: N/A;  . CARDIAC CATHETERIZATION N/A 08/07/2016   Procedure: Coronary/Graft Atherectomy-CSI LAD;  Surgeon: Peter M Martinique, MD;  Location: [place] CV [facility];  Service: Cardiovascular;  Laterality: N/A;  . CERVICAL SPINE SURGERY   2/09   "to repair nerve problems in my left arm"  . CHOLECYSTECTOMY    . COLONOSCOPY WITH PROPOFOL N/A 04/26/2017   Procedure: COLONOSCOPY WITH PROPOFOL;  Surgeon: Otis Brace, MD;  Location: [place];  Service: Gastroenterology;  Laterality: N/A;  . CORONARY ANGIOPLASTY WITH STENT PLACEMENT  06/11/2008  . CORONARY ANGIOPLASTY WITH STENT PLACEMENT  06/2007   TAXUS stent to RCA/notes 01/31/2011  . ESOPHAGOGASTRODUODENOSCOPY  09/28/2011   Procedure: ESOPHAGOGASTRODUODENOSCOPY (EGD);  Surgeon: Missy Sabins, MD;  Location: [facility] ENDOSCOPY;  Service: Endoscopy;  Laterality: N/A;  . ESOPHAGOGASTRODUODENOSCOPY N/A 04/07/2015   Procedure: ESOPHAGOGASTRODUODENOSCOPY (EGD);  Surgeon: Teena Irani, MD;  Location: [facility];  Service: Endoscopy;  Laterality: N/A;  . ESOPHAGOGASTRODUODENOSCOPY N/A 04/19/2015   Procedure: ESOPHAGOGASTRODUODENOSCOPY (EGD);  Surgeon: Arta Silence, MD;  Location: [facility] ENDOSCOPY;  Service: Endoscopy;  Laterality: N/A;  . ESOPHAGOGASTRODUODENOSCOPY (EGD) WITH PROPOFOL N/A 06/13/2018   Procedure: ESOPHAGOGASTRODUODENOSCOPY (EGD) WITH PROPOFOL;  Surgeon: Wonda Horner, MD;  Location: [facility] ENDOSCOPY;  Service: Endoscopy;  Laterality: N/A;  . FLEXIBLE SIGMOIDOSCOPY N/A 05/21/2017   Procedure: Conni Elliot;  Surgeon: Clarene Essex, MD;  Location: [place];  Service: Endoscopy;  Laterality: N/A;  . FLEXIBLE SIGMOIDOSCOPY Left 07/02/2017   Procedure: FLEXIBLE SIGMOIDOSCOPY;  Surgeon: Laurence Spates, MD;  Location: [place];  Service: Endoscopy;  Laterality: Left;  . FOREIGN BODY REMOVAL  09/2003   via upper endoscopy/notes 02/12/2011  . GIVENS CAPSULE STUDY  09/30/2011   Procedure: GIVENS CAPSULE STUDY;  Surgeon: Jeryl Columbia, MD;  Location: [facility] ENDOSCOPY;  Service: Endoscopy;  Laterality: N/A;  . INSERTION OF DIALYSIS CATHETER Right 2014  . INSERTION OF DIALYSIS CATHETER Left 02/11/2013   Procedure: INSERTION OF DIALYSIS CATHETER;  Surgeon: Conrad Williamsburg, MD;   Location: [place];  Service: Vascular;  Laterality: Left;  Ultrasound guided  . LUMBAR LAMINECTOMY/DECOMPRESSION MICRODISCECTOMY Bilateral 07/31/2017   Procedure: LAMINECTOMY AND FORAMINOTOMY- BILATERAL LUMBAR TWO- LUMBAR THREE;  Surgeon: Earnie Larsson, MD;  Location: [place];  Service: Neurosurgery;  Laterality: Bilateral;  LAMINECTOMY AND FORAMINOTOMY- BILATERAL LUMBAR 2- LUMBAR 3  . PERIPHERAL VASCULAR BALLOON ANGIOPLASTY  04/07/2018   Procedure: PERIPHERAL VASCULAR BALLOON ANGIOPLASTY;  Surgeon: Waynetta Sandy, MD;  Location: [place] CV LAB;  Service: Cardiovascular;;  RUE AVF  . REMOVAL OF A DIALYSIS CATHETER Right 02/11/2013   Procedure: REMOVAL OF A DIALYSIS CATHETER;  Surgeon: Conrad Kwigillingok, MD;  Location: [place];  Service: Vascular;  Laterality: Right;  . SAVORY DILATION N/A 04/07/2015   Procedure: SAVORY DILATION;  Surgeon: Teena Irani, MD;  Location: [facility] ENDOSCOPY;  Service: Endoscopy;  Laterality: N/A;  . SHUNTOGRAM N/A 09/20/2011   Procedure: Earney Mallet;  Surgeon: Conrad Argyle, MD;  Location: [facility] CATH LAB;  Service: Cardiovascular;  Laterality: N/A;  . SVT ABLATION N/A 11/26/2016   Procedure: SVT Ablation;  Surgeon: Will Meredith Leeds, MD;  Location: [place] CV LAB;  Service: Cardiovascular;  Laterality: N/A;  . TEE WITHOUT CARDIOVERSION N/A 08/25/2018   Procedure: TRANSESOPHAGEAL ECHOCARDIOGRAM (TEE);  Surgeon: Dorothy Spark, MD;  Location: [facility] ENDOSCOPY;  Service: Cardiovascular;  Laterality: N/A;  . TONSILLECTOMY    . TOTAL KNEE ARTHROPLASTY Right 08/02/2015   Procedure: TOTAL KNEE ARTHROPLASTY;  Surgeon: Renette Butters, MD;  Location: [place];  Service: Orthopedics;  Laterality: Right;  . VENOGRAM N/A 01/26/2013   Procedure: VENOGRAM;  Surgeon: Angelia Mould, MD;  Location: [facility] CATH LAB;  Service: Cardiovascular;  Laterality: N/A;    Allergies: Statins and Ciprofloxacin  Medications: Prior to Admission medications   Medication Sig Start Date End Date Taking?  Authorizing Provider  acetaminophen (TYLENOL) 325 MG tablet Take 650 mg by mouth every 6 (six) hours as needed for mild pain or headache.    Yes [provider]  acetaminophen-codeine (TYLENOL #3) 300-30 MG tablet Take 1-2 tablets by mouth 4 (four) times daily as needed. 11/05/18  Yes [provider]  Alirocumab (PRALUENT) 150 MG/ML SOPN Inject 150 mg into the skin every 14 (fourteen) days. 03/05/18  Yes Lorretta Harp, MD  allopurinol (ZYLOPRIM) 100 MG tablet TAKE ONE TABLET BY MOUTH ONCE DAILY Patient taking differently: Take 100 mg by mouth daily.  10/14/17  Yes Biagio Borg, MD  aspirin EC 325 MG tablet Take 1 tablet (325 mg total) by mouth daily. 10/10/18  Yes Hongalgi, Lenis Dickinson, MD  betamethasone dipropionate (DIPROLENE) 0.05 % cream Apply 1 application topically daily as needed (leg sores).   Yes [provider]  citalopram (CELEXA) 10 MG tablet Take 1 tablet (10 mg total) by mouth at bedtime. 10/14/17  Yes Biagio Borg, MD  colchicine 0.6 MG tablet Take 1 tablet (0.6 mg total) by mouth daily. 08/08/18  Yes Biagio Borg, MD  doxercalciferol ([name]) 4 MCG/2ML injection Inject 1 mL (2 mcg total) into the vein Every Tuesday,Thursday,and Saturday with dialysis. 09/02/18  Yes Hall, Archie Patten N, DO  ezetimibe (ZETIA) 10 MG tablet TAKE ONE TABLET BY MOUTH ONCE DAILY Patient taking differently: Take 10 mg by mouth daily.  04/08/17  Yes Bensimhon, Shaune Pascal, MD  ferric citrate (AURYXIA) 1 GM 210 MG(Fe) tablet Take 2 tablets (420 mg total) by mouth 3 (three) times daily with meals. 08/30/18  Yes Irene Pap N, DO  gabapentin (NEURONTIN) 100 MG capsule Take 1 capsule (100 mg total) by mouth at bedtime. 08/31/18  Yes Kayleen Memos, DO  hydroxypropyl methylcellulose / hypromellose (ISOPTO TEARS / GONIOVISC) 2.5 % ophthalmic solution Place 1 drop into both eyes 3 (three) times daily as needed for dry eyes. 05/03/17  Yes Biagio Borg, MD  methimazole (TAPAZOLE) 5 MG tablet Take 0.5  tablets (2.5 mg total) by mouth daily. 05/16/18  Yes Renato Shin, MD  multivitamin (RENA-VIT) TABS tablet Take 1 tablet by mouth daily.    Yes [provider]  Nutritional Supplements (FEEDING SUPPLEMENT, NEPRO CARB STEADY,) LIQD Take 237 mLs by mouth 2 (two) times daily between meals. 08/30/18  Yes Hall, Carole N, DO  pantoprazole (PROTONIX) 40 MG tablet Take 40 mg by mouth at bedtime.  11/03/18  Yes [provider]  polyethylene glycol (MIRALAX / GLYCOLAX) packet Take 17 g by mouth daily. Patient taking differently: Take 17 g by mouth daily as needed for mild constipation or moderate constipation.  05/23/17  Yes Florencia Reasons, MD  SENSIPAR 90 MG tablet Take 90 mg by mouth 2 (two) times daily. 09/18/16  Yes [provider]  vancomycin (VANCOCIN) 125 MG capsule Take 1 capsule (125 mg total) by mouth 2 (two) times daily. 10/28/18  Yes [place] Callas, NP  [facility]  625 MG tablet TAKE 3 TABLETS BY MOUTH TWICE DAILY WITH MEALS Patient taking differently: Take 1,875 mg by mouth 2 (two) times daily with a meal.  11/07/18  Yes Biagio Borg, MD  zolpidem (AMBIEN) 5 MG tablet Take 1 tablet (5 mg total) by mouth at bedtime as needed. for sleep Patient taking differently: Take 5 mg by mouth at bedtime as needed for sleep.  06/20/18  Yes Biagio Borg, MD     Family History  Problem Relation Age of Onset  . Heart disease Sister   . Thyroid nodules Sister   . Heart disease Father   . Diabetes Father   . Kidney failure Father   . Hypertension Father   . Healthy Child   . Healthy Child   . Healthy Child   . Cancer Neg Hx     Social History   Socioeconomic History  . Marital status: Married    Spouse name: Not on file  . Number of children: 3  . Years of education: 54  . Highest education level: Not on file  Occupational History  . Occupation: disabled    Employer: DISABLED  . Occupation: formerly Lawyer for [employer].  Social Needs  . Financial resource strain:  Not on file  . Food insecurity:    Worry: Not on file    Inability: Not on file  . Transportation needs:    Medical: Not on file    Non-medical: Not on file  Tobacco Use  . Smoking status: Former Smoker    Packs/day: 1.00    Years: 25.00    Pack years: 25.00    Types: Cigarettes    Last attempt to quit: 10/01/2005    Years since quitting: 13.1  . Smokeless tobacco: Former Systems developer    Quit date: 10/01/2005  . Tobacco comment: Quit smoking 2007 Smoked x 25 years 1/2 ppd.  Substance and Sexual Activity  . Alcohol use: No    Alcohol/week: 0.0 standard drinks  . Drug use: No  . Sexual activity: Never  Lifestyle  . Physical activity:    Days per week: 2 days    Minutes per session: 10 min  . Stress: Only a little  Relationships  . Social connections:    Talks on phone: Three times a week    Gets together: Once a week    Attends religious service: 1 to 4 times per year    Active member of club or organization: No    Attends meetings of clubs or organizations: Never    Relationship status: Married  Other Topics Concern  . Not on file  Social History Narrative   Lives with wife   Caffeine use: Tea sometimes   No coffee   Left handed     Review of Systems: A 12 point ROS discussed and pertinent positives are indicated in the HPI above.  All other systems are negative.  Review of Systems  Constitutional: Positive for fatigue.  Respiratory: Negative for cough and shortness of breath.   Gastrointestinal: Negative for abdominal pain.  Neurological: Positive for weakness.  Psychiatric/Behavioral: Negative for behavioral problems and confusion.    Vital Signs: BP (!) 166/81 Comment: MD Aware  Pulse 67   Temp 97.7 F (36.5 C) (Oral)   Resp 18   Ht 6\' 1"  (1.854 m)   Wt 234 lb 9.1 oz (106.4 kg)   SpO2 96%   BMI 30.95 kg/m   Physical Exam Cardiovascular:     Rate and  Rhythm: Normal rate and regular rhythm.     Heart sounds: Normal heart sounds.  Pulmonary:     Breath  sounds: Normal breath sounds.  Abdominal:     General: Bowel sounds are normal.     Palpations: Abdomen is soft.  Musculoskeletal: Normal range of motion.     Comments: right upper fistula + pulse No thrill NT No swelling   Skin:    General: Skin is warm and dry.  Neurological:     Mental Status: He is alert and oriented to person, place, and time.  Psychiatric:        Mood and Affect: Mood normal.        Behavior: Behavior normal.        Thought Content: Thought content normal.        Judgment: Judgment normal.     Imaging: Ct Abdomen Pelvis W Contrast  Result Date: 11/11/2018 CLINICAL DATA:  Generalized abdomen pain with fever EXAM: CT ABDOMEN AND PELVIS WITH CONTRAST TECHNIQUE: Multidetector CT imaging of the abdomen and pelvis was performed using the standard protocol following bolus administration of intravenous contrast. CONTRAST:  116mL OMNIPAQUE IOHEXOL 300 MG/ML  SOLN COMPARISON:  October 04, 2018 FINDINGS: Lower chest: The heart size is enlarged. Mild atelectasis of posterior lung bases are noted. Right lung base nodule 4 mm unchanged. Hepatobiliary: Mild diffuse low density of liver is identified. Patient status post prior cholecystectomy with intrahepatic biliary ductal dilatation and extrahepatic biliary ductal dilatation identified. Pneumobilia is noted. Pancreas: Unremarkable. No pancreatic ductal dilatation or surrounding inflammatory changes. Spleen: Normal in size without focal abnormality. Adrenals/Urinary Tract: The adrenal glands are normal. Bilateral kidney cysts are identified in atrophic kidneys bilaterally. Renal vascular calcifications are identified bilaterally. There is no hydronephrosis bilaterally. The bladder is decompressed limiting evaluation. Stomach/Bowel: Stomach is within normal limits. Appendix appears normal. No evidence of bowel wall thickening, distention, or inflammatory changes. Vascular/Lymphatic: Aortic atherosclerosis. No enlarged abdominal or  pelvic lymph nodes. Reproductive: Surgical clips are identified in the prostate gland unchanged. Other: None. Musculoskeletal: Degenerative joint changes of the spine are noted. Patient status post prior posterior fusion of lower lumbar spine. IMPRESSION: No acute abnormality identified in the abdomen and pelvis. Fatty infiltration of liver. Status post prior cholecystectomy with intra and extrahepatic biliary ductal dilatation, postsurgical. Atrophic bilateral kidneys. Electronically Signed   By: Abelardo Diesel M.D.   On: 11/11/2018 19:58   Mr Abdomen Mrcp Wo Contrast  Result Date: 11/13/2018 CLINICAL DATA:  Sepsis.  History of cholecystectomy. EXAM: MRI ABDOMEN WITHOUT CONTRAST  (INCLUDING MRCP) TECHNIQUE: Multiplanar multisequence MR imaging of the abdomen was performed. Heavily T2-weighted images of the biliary and pancreatic ducts were obtained, and three-dimensional MRCP images were rendered by post processing. COMPARISON:  CT scan 11/11/2018 FINDINGS: Lower chest: Bibasilar atelectasis. No pleural or pericardial effusions. Hepatobiliary: Intra and extrahepatic biliary dilatation is demonstrated. The common bile duct measures up to 14 mm. There is an 8 mm distal common bile duct stone along with a few other smaller stones layering dependently. Small calculi are also noted in the cystic duct remnant. There is also non dependent pneumobilia noted. Pancreas:  No mass, inflammation or ductal dilatation. Spleen:  Normal size.  No focal lesions. Adrenals/Urinary Tract: The adrenal glands are unremarkable and stable. The kidneys are small and there are numerous cysts, typical findings related to dialysis. Stomach/Bowel: Visualized portions within the abdomen are unremarkable. Vascular/Lymphatic: No pathologically enlarged lymph nodes identified. No abdominal aortic aneurysm demonstrated. Other:  No ascites  or abdominal wall hernia. Musculoskeletal: No significant bony findings. Spinal fusion hardware is noted.  IMPRESSION: 1. 8 mm distal common bile duct stones along with a few other small common bile duct stones. I suspect there also small stones in the cystic duct remnant. There is also pneumobilia with some non dependent air bubbles in the biliary tree. 2. Unremarkable MR appearance of the pancreas. 3. Small/atrophied kidneys with numerous cysts. Electronically Signed   By: Marijo Sanes M.D.   On: 11/13/2018 20:53   Mr 3d Recon At Scanner  Result Date: 11/13/2018 CLINICAL DATA:  Sepsis.  History of cholecystectomy. EXAM: MRI ABDOMEN WITHOUT CONTRAST  (INCLUDING MRCP) TECHNIQUE: Multiplanar multisequence MR imaging of the abdomen was performed. Heavily T2-weighted images of the biliary and pancreatic ducts were obtained, and three-dimensional MRCP images were rendered by post processing. COMPARISON:  CT scan 11/11/2018 FINDINGS: Lower chest: Bibasilar atelectasis. No pleural or pericardial effusions. Hepatobiliary: Intra and extrahepatic biliary dilatation is demonstrated. The common bile duct measures up to 14 mm. There is an 8 mm distal common bile duct stone along with a few other smaller stones layering dependently. Small calculi are also noted in the cystic duct remnant. There is also non dependent pneumobilia noted. Pancreas:  No mass, inflammation or ductal dilatation. Spleen:  Normal size.  No focal lesions. Adrenals/Urinary Tract: The adrenal glands are unremarkable and stable. The kidneys are small and there are numerous cysts, typical findings related to dialysis. Stomach/Bowel: Visualized portions within the abdomen are unremarkable. Vascular/Lymphatic: No pathologically enlarged lymph nodes identified. No abdominal aortic aneurysm demonstrated. Other:  No ascites or abdominal wall hernia. Musculoskeletal: No significant bony findings. Spinal fusion hardware is noted. IMPRESSION: 1. 8 mm distal common bile duct stones along with a few other small common bile duct stones. I suspect there also small stones  in the cystic duct remnant. There is also pneumobilia with some non dependent air bubbles in the biliary tree. 2. Unremarkable MR appearance of the pancreas. 3. Small/atrophied kidneys with numerous cysts. Electronically Signed   By: Marijo Sanes M.D.   On: 11/13/2018 20:53   Dg Chest Port 1 View  Result Date: 11/11/2018 CLINICAL DATA:  Fever and altered mental status EXAM: PORTABLE CHEST 1 VIEW COMPARISON:  October 03, 2018 FINDINGS: The heart size and mediastinal contours are stable. The aorta is tortuous. There is mild central pulmonary vascular congestion. There is no frank pulmonary edema, focal pneumonia or pleural effusion. The visualized skeletal structures are stable. IMPRESSION: Mild central pulmonary vascular congestion. Electronically Signed   By: Abelardo Diesel M.D.   On: 11/11/2018 16:08    Labs:  CBC: Recent Labs    11/11/18 1701 11/12/18 0309 11/13/18 0357 11/14/18 0539  WBC 8.2 18.7* 12.5* 8.5  HGB 13.1 11.4* 10.6* 11.0*  HCT 43.0 37.4* 34.1* 35.6*  PLT 233 216 189 198    COAGS: Recent Labs    07/16/18 1453 07/16/18 2235 08/19/18 2124 08/21/18 0358 08/22/18 0443 11/14/18 0539  INR 1.09 1.12 1.15  --   --  0.99  APTT 33 37* 35 43* 73*  --     BMP: Recent Labs    11/12/18 0309 11/12/18 1557 11/13/18 0357 11/14/18 0539  NA 138 141 140 141  K 6.2* 5.5* 5.4* 5.1  CL 105 106 105 107  CO2 24 21* 18* 19*  GLUCOSE 212* 111* 110* 97  BUN 43* 52* 56* 72*  CALCIUM 7.8* 8.3* 7.9* 7.9*  CREATININE 9.73* 10.77* 11.60* 13.15*  GFRNONAA 5*  4* 4* 3*  GFRAA 6* 5* 5* 4*    LIVER FUNCTION TESTS: Recent Labs    10/06/18 0533 10/09/18 0345  11/11/18 1701 11/12/18 0309 11/13/18 0357 11/14/18 0539  BILITOT 0.7 0.7  --  1.9* 2.4*  --   --   AST 50* 17  --  332* 224*  --   --   ALT 165* 59*  --  211* 230*  --   --   ALKPHOS 193* 132*  --  181* 170*  --   --   PROT 7.8 6.9  --  7.3 6.6  --   --   ALBUMIN 3.1* 2.9*   < > 3.2* 2.8* 2.5* 2.6*   < > = values in  this interval not displayed.    TUMOR MARKERS: No results for input(s): AFPTM, CEA, CA199, CHROMGRNA in the last 8760 hours.  Assessment and Plan:  Right upper arm fistula Unable to cannulate in dialysis yesterday-- no dialysis performed No thrill +pulse Scheduled for fistulogram with possible intervention-- possible tunneled dialysis catheter placement Pt is aware of procedure benefits and risks  Including but not limited to Infection; bleeding; fistula damage Agreeable to proceed Consent signed in chart  Thank you for this interesting consult.  I greatly enjoyed meeting [name] and look forward to participating in their care.  A copy of this report was sent to the requesting provider on this date.  Electronically Signed: Lavonia Drafts, PA-C 11/14/2018, 8:16 AM   I spent a total of 20 Minutes    in face to face in clinical consultation, greater than 50% of which was counseling/coordinating care for right arm fistulogram evaluation/possible intervention/ possible catheterplacement

## 2018-11-14 NOTE — Progress Notes (Signed)
[facility] Progress Note  Subjective: pt in procedures all day, fistulogram then ERCP, wasn't able to examine  Vitals:   11/14/18 1541 11/14/18 1551 11/14/18 1601 11/14/18 1611  BP: (!) 147/66 (!) 167/67 (!) 164/70 (!) 178/75  Pulse: (!) 59 (!) 54 (!) 59 64  Resp: 16 18 (!) 9 15  Temp: 97.8 F (36.6 C)     TempSrc: Oral     SpO2: 100% 100% 99% 100%  Weight:      Height:        Inpatient medications: . allopurinol  100 mg Oral Daily  . allopurinol  100 mg Oral Daily  . aspirin  324 mg Oral Daily  . aspirin EC  325 mg Oral Daily  . Chlorhexidine Gluconate Cloth  6 each Topical Q0600  . cinacalcet  90 mg Oral BID  . citalopram  10 mg Oral QHS  . colchicine  0.6 mg Oral Daily  . colesevelam  1,875 mg Oral BID WC  . diphenhydrAMINE      . [START ON 11/15/2018] doxercalciferol  2 mcg Intravenous Q T,Th,Sa-HD  . ezetimibe  10 mg Oral Daily  . ezetimibe  10 mg Oral Daily  . [START ON 11/15/2018] feeding supplement (NEPRO CARB STEADY)  237 mL Oral BID BM  . fentaNYL      . ferric citrate  420 mg Oral TID WC  . gabapentin  300 mg Oral QHS  . hydrocortisone sod succinate (SOLU-CORTEF) inj  25 mg Intravenous Q12H  . insulin aspart  0-9 Units Subcutaneous TID WC  . lidocaine      . methimazole  2.5 mg Oral Daily  . methimazole  2.5 mg Oral Daily  . midazolam      . midodrine  5 mg Oral TID WC  . multivitamin  1 tablet Oral Daily  . pantoprazole  40 mg Oral QHS  . pantoprazole  40 mg Oral Daily  . PRALUENT  150 mg Subcutaneous Q14 Days  . triamcinolone cream   Topical BID  . vancomycin  125 mg Oral BID  . vancomycin  125 mg Oral Q12H   . sodium chloride Stopped (11/14/18 1536)  .  ceFAZolin (ANCEF) IV Stopped (11/14/18 0020)     Iron/TIBC/Ferritin/ %Sat    Component Value Date/Time   IRON 16 (L) 06/12/2018 0500   TIBC 168 (L) 06/12/2018 0500   FERRITIN 709 (H) 10/17/2016 1103   IRONPCTSAT 10 (L) 06/12/2018 0500    Exam: Gen - pt not in room Chest  - RRR - Abd - MS - Ext - Neuro - RUA AVF    Home meds:  - alirocumab 150mg  every 2 wks  - allopurinol 100 qd/ colchicine 0.6 qd/ ferric citrate 420mg  tid ac  - pantoprazole 40 hs/ methimazole 2.5 mg qd/ sensipar 90 qd  - welchol 1875mg  bid ac/ ezetimibe 10 qd/ aspirin 325 qd  - citalopram 10 qd/ zolpidem 5mg  hs prn  - po vancomycin 125mg  qid    [facility]  4h  97.5kg   2/2.25 bath  P2  Hep none  RUA AVF  - hect 2 ug  - no esa   Assessment: 1. Fevers r/o sepsis: blood cx's and Cdif neg so far 2. ^LFT's - biliary obstruction by MRCP, in endo today for ERCP 3. ESRD on HD: AVF malfunction sp fistulogram today w/ intervention. Had recent intervention at [facility].  4. Volume: up 5-6 kg by wts, not symptomatic 5. Hypotension - resolved 6. HL  on sq Rx 7. MBD of ckd - cont meds 8. Anemia of ckd - no esa at OP center  P: 1. HD tonight and tomorrow. Hopefully pt will agree to dialyze tonight as he has had 2 procedures already.       [facility] 11/14/2018, 4:46 PM  Recent Labs  Lab 11/13/18 0357 11/14/18 0539  NA 140 141  K 5.4* 5.1  CL 105 107  CO2 18* 19*  GLUCOSE 110* 97  BUN 56* 72*  CREATININE 11.60* 13.15*  CALCIUM 7.9* 7.9*  PHOS 5.3* 7.1*  ALBUMIN 2.5* 2.5*  2.6*  INR  --  0.99   Recent Labs  Lab 11/12/18 0309 11/14/18 0539  AST 224* 31  ALT 230* 95*  ALKPHOS 170* 126  BILITOT 2.4* 1.0  PROT 6.6 6.5   Recent Labs  Lab 11/11/18 1701  11/13/18 0357 11/14/18 0539  WBC 8.2   < > 12.5* 8.5  NEUTROABS 7.5  --   --   --   HGB 13.1   < > 10.6* 11.0*  HCT 43.0   < > 34.1* 35.6*  MCV 98.9   < > 96.3 94.2  PLT 233   < > 189 198   < > = values in this interval not displayed.

## 2018-11-14 NOTE — Progress Notes (Signed)
Hypoglycemic Event  CBG: 63 1210  Treatment: D50 25 mL (12.5 gm)  Symptoms: None  Follow-up CBG: Time: 1257 CBG Result 70  Possible Reasons for Event: Other: NPO for procedure  Comments/MD notified:    [name]

## 2018-11-14 NOTE — Op Note (Signed)
[facility] Patient Name: [name] Procedure Date : 11/14/2018 MRN: [id number] Attending MD: Ronnette Juniper , MD Date of Birth: Sep 26, 1951 CSN: [id number] Age: 68 Admit Type: Inpatient Procedure:                ERCP Indications:              Common bile duct stone(s) Providers:                Ronnette Juniper, MD, Vista Lawman, RN, Charolette Child,                            Technician, Lance Coon, CRNA Referring MD:              Medicines:                Monitored Anesthesia Care Complications:            No immediate complications. Estimated blood loss:                            None Estimated Blood Loss:     Estimated blood loss was minimal. Procedure:                Pre-Anesthesia Assessment:                           - Prior to the procedure, a History and Physical                            was performed, and patient medications and                            allergies were reviewed. The patient's tolerance of                            previous anesthesia was also reviewed. The risks                            and benefits of the procedure and the sedation                            options and risks were discussed with the patient.                            All questions were answered, and informed consent                            was obtained. Prior Anticoagulants: The patient has                            taken aspirin, last dose was 2 days prior to                            procedure. ASA Grade Assessment: III - A patient  with severe systemic disease. After reviewing the                            risks and benefits, the patient was deemed in                            satisfactory condition to undergo the procedure.                           After obtaining informed consent, the scope was                            passed under direct vision. Throughout the                            procedure, the patient's blood pressure, pulse,  and                            oxygen saturations were monitored continuously. The                            [id number]-Q180V ([id number]) Olympus Duodensocope was                            introduced through the mouth, and used to inject                            contrast into and used to inject contrast into the                            bile duct. The ERCP was accomplished without                            difficulty. The patient tolerated the procedure                            well. Scope In: Scope Out: Findings:      The esophagus was successfully intubated under direct vision. The scope       was advanced to a normal major papilla in the descending duodenum       without detailed examination of the pharynx, larynx and associated       structures, and upper GI tract.      A pyloric channel stenosis was noted. The duodenal scope could not be       advanced into the duodenal bulb.      Subsequently a gastroscope was used and the pyloric channel was dilated       with 18/19/20 mm balloon, 20 mm balloon 2 minutes consecutively,minimal       bleeding was noted postdilatation.      Thereafter, a duodenal scope could be easily advanced into the second       portion of the duodenum and the ampulla was identified.      The bile duct was deeply cannulated with the sphincterotome. Contrast       was injected. I personally interpreted the bile duct  images. There was       brisk flow of contrast through the ducts. Image quality was excellent.       Contrast extended to the entire biliary tree.      The main bile duct was severely dilated. The largest diameter was 18 mm.       A cholecystectomy had been performed.      A straight Roadrunner wire was passed into the biliary tree.      A 12 mm biliary sphincterotomy was made with a braided sphincterotome       using ERBE electrocautery. There was no post-sphincterotomy bleeding.      The biliary tree was swept with a 9-12 mm and then with a 12- 15  mm       balloon starting at the bifurcation.      Large amount of sludge was swept from the duct. Many stones were       removed. No stones remained.      The pancreatic duct was never cannulated or injected during the entire       procedure. Impression:               - Pyloric stenosis, balloon dilation performed.                           - The entire main bile duct was severely dilated.                           - The patient has had a cholecystectomy.                           - Choledocholithiasis was found. Complete removal                            was accomplished by biliary sphincterotomy and                            balloon extraction.                           - A biliary sphincterotomy was performed.                           - The biliary tree was swept. Moderate Sedation:      Patient did not receive moderate sedation for this procedure, but       instead received monitored anesthesia care. Recommendation:           - Renal diet.                           - Check liver enzymes (AST, ALT, alkaline                            phosphatase, bilirubin) tomorrow. Procedure Code(s):        --- Professional ---                           [phone], Endoscopic retrograde  cholangiopancreatography (ERCP); with removal of                            calculi/debris from biliary/pancreatic duct(s)                           [phone], Endoscopic retrograde                            cholangiopancreatography (ERCP); with                            sphincterotomy/papillotomy Diagnosis Code(s):        --- Professional ---                           Z90.49, Acquired absence of other specified parts                            of digestive tract                           K80.50, Calculus of bile duct without cholangitis                            or cholecystitis without obstruction                           K83.8, Other specified diseases of biliary tract CPT copyright  2018 [employer]. All rights reserved. The codes documented in this report are preliminary and upon coder review may  be revised to meet current compliance requirements. Ronnette Juniper, MD 11/14/2018 3:33:34 PM This report has been signed electronically. Number of Addenda: 0

## 2018-11-14 NOTE — Anesthesia Procedure Notes (Signed)
Procedure Name: Intubation Date/Time: 11/14/2018 2:24 PM Performed by: Lance Coon, CRNA Pre-anesthesia Checklist: Patient identified, Emergency Drugs available, Suction available, Patient being monitored and Timeout performed Patient Re-evaluated:Patient Re-evaluated prior to induction Oxygen Delivery Method: Circle system utilized Preoxygenation: Pre-oxygenation with 100% oxygen Induction Type: IV induction Ventilation: Mask ventilation without difficulty and Oral airway inserted - appropriate to patient size Laryngoscope Size: Miller and 3 Grade View: Grade II Tube type: Oral Tube size: 7.5 mm Number of attempts: 1 Airway Equipment and Method: Stylet Placement Confirmation: ETT inserted through vocal cords under direct vision,  positive ETCO2 and breath sounds checked- equal and bilateral Secured at: 22 cm Tube secured with: Tape Dental Injury: Teeth and Oropharynx as per pre-operative assessment

## 2018-11-14 NOTE — Procedures (Signed)
Interventional Radiology Procedure Note  Procedure: Fistulagram of right UE brachiocephalic fistula. PTA of the proximal venous outflow extending from AV connection to cephalic vein aneurysm. PTA with 22mm and 72mm.  Findings: long segment proximal venous outflow stenosis, starting at the AV connection. This was treated with 84mm and 55mm PTA, with <30% residual.  No venous outflow stenosis.  Venous outflow stent patent with no instent or edge stenosis.  Central venous outflow patent Excellent thrill upon completion  Complications: None  Recommendations:  - Ok to use - may remove stay suture at next dialysis after 24 hours - Routine care   Signed,  Dulcy Fanny. Earleen Newport, DO

## 2018-11-14 NOTE — Sedation Documentation (Signed)
Patient denies pain and is resting comfortably. Denies any itching. Airway intact denies shortness of breath.

## 2018-11-14 NOTE — Anesthesia Postprocedure Evaluation (Signed)
Anesthesia Post Note  Patient: [name]  Procedure(s) Performed: ENDOSCOPIC RETROGRADE CHOLANGIOPANCREATOGRAPHY (ERCP) (N/A ) BALLOON DILATION (N/A ) SPHINCTEROTOMY REMOVAL OF STONES ESOPHAGOGASTRODUODENOSCOPY (EGD)     Patient location during evaluation: PACU Anesthesia Type: General Level of consciousness: awake Pain management: pain level controlled Vital Signs Assessment: post-procedure vital signs reviewed and stable Respiratory status: spontaneous breathing, nonlabored ventilation, respiratory function stable and patient connected to nasal cannula oxygen Cardiovascular status: blood pressure returned to baseline and stable Postop Assessment: no apparent nausea or vomiting Anesthetic complications: no    Last Vitals:  Vitals:   11/14/18 2030 11/14/18 2108  BP: (!) 150/86 (!) 159/84  Pulse: 67 62  Resp: 16 18  Temp: (!) 36.4 C (!) 36.3 C  SpO2: 95% 99%    Last Pain:  Vitals:   11/14/18 2108  TempSrc: Oral  PainSc:                  Karyl Kinnier Ellender

## 2018-11-14 NOTE — Op Note (Addendum)
ERCP was performed for CBD stones notes on MRCP.  Findings: Pyloric channel stenosis noted, dilated with a 20 mm balloon for 2 minutes. Duodenoscope advanced into the second portion of the duodenum post dilatation. CBD easily cannulated, cholangiogram revealed dilated CBD of 18 mm, dilated intrahepatics, surgical clips from prior cholecystectomy.  Sphincterotomy was performed, balloon sweep was performed initially with 9/12 mm balloon, followed by 12/15 mm balloon. Large amount of sludge and stone was retrieved. At the conclusion, occlusive cholangiogram did not reveal any further filling defects. The pancreatic duct was never injected or cannulated during the entire procedure.   Recommendations: Resume renal diet. Liver enzymes in a.m.[name] PPI daily for 1 week, as pyloric stenosis was dilated.  Ronnette Juniper, M.D.

## 2018-11-14 NOTE — H&P (View-Only) (Signed)
[facility] Consult  Referring Provider: Jonetta Osgood, MD/[facility] Primary Care Physician:  Biagio Borg, MD Primary Gastroenterologist: [facility]/Dr.Outlaw  Reason for Consultation:  CBD stones  HPI: [name] is a 68 y.o. male was admitted on 11/11/2018 from hemodialysis for hypotension(SBP in 74s), fever(102F) and abdominal pain, subsequently treated for septic shock. He has history of klebsiella pneumoniae bacteremia on 10/04/2018.  MRCP yesterday which showed intra-and extrahepatic biliary dilatation, CBD up to 14 mm, 8 mm distal CBD stone along with a few other smaller stones layering dependently, small calculi also noted in cystic duct remnant.  He had a cholecystectomy in 2011, denies yellowish discoloration of skin or change in the color of stool or urine.  In the past has been treated for radiation proctitis with APC as well as RFA.  History of dilatation of esophageal stricturing 2016.  He tested positive for C. Difficile in 08/21/18,reports worsening of diarrhea with use of antibiotics and is continued on vancomycin 125 mg every 12 hours.  He missed his hemodialysis yesterday,as AV fistula was not accessible and is going downstairs to IR currently for fistulogram with possible intervention.  Past Medical History:  Diagnosis Date  . Acute encephalopathy 10/04/2018  . Allergic rhinitis, cause unspecified 02/24/2014  . Anemia 06/16/2011  . BENIGN PROSTATIC HYPERTROPHY 10/14/2009  . CAD, NATIVE VESSEL 02/06/2009   a. 06/2007 s/p Taxus DES to the RCA;  b. 08/2016 NSTEMI in setting of SVT/PCI: LM 30ost, LAD 6m (3.0x16 Synergy DES), LCX 56m, OM1 60, RI 40, RCA 70p/m, 63m - not amenable to PCI.  [name] Cervical radiculopathy, chronic 02/23/2016   Right c5-6 by NCS/EMG  . Chest pain 08/09/2015  . Chronic combined systolic (congestive) and diastolic (congestive) heart failure ([place])    a. 10/2016 Echo: EF 40-45%, Gr2 DD. mildly dil LA.  [name] COLONIC POLYPS, HX OF  10/14/2009  . Dementia ([place]) [id number]  . Depression 09/24/2015  . DIABETES MELLITUS, TYPE II 02/01/2010  . DYSLIPIDEMIA 06/18/2007  . ESRD (end stage renal disease) on dialysis ([place]) 08/04/2010   ESRD due to DM adn HTN, started HD in 2011. As of 2019 has a R arm graft and gets HD on TTS sched  . FOOT PAIN 08/12/2008  . GAIT DISTURBANCE 03/03/2010  . GASTROENTERITIS, VIRAL 10/14/2009  . GERD 06/18/2007  . GOITER, MULTINODULAR 12/26/2007  . GOUT 06/18/2007  . GYNECOMASTIA 07/17/2010  . Hemodialysis access, fistula mature [facility])    Dialysis T-Th-Sa ([place]) Right upper arm fistula  . Hyperlipidemia 10/16/2011  . Hyperparathyroidism, secondary ([place]) 06/16/2011  . HYPERTENSION 06/18/2007  . Hyperthyroidism   . Hypocalcemia 06/07/2010  . Ischemic cardiomyopathy    a. 10/2016 Echo: EF 40-45%.  . Lumbar stenosis with neurogenic claudication   . ONYCHOMYCOSIS, TOENAILS 12/26/2007  . OSA on CPAP 10/16/2011  . Other malaise and fatigue 11/24/2009  . PERIPHERAL NEUROPATHY 06/18/2007  . Prostate cancer ([place])   . PSVT (paroxysmal supraventricular tachycardia) ([place])    a. 39/7673 complicated by NSTEMI;  b. 11/2016 Treated w/ adenosine in ED;  c. 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  [name] PULMONARY NODULE, RIGHT LOWER LOBE 06/08/2009  . Sleep apnea    cpap machine and o2  . TRANSAMINASES, SERUM, ELEVATED 02/01/2010  . Transfusion history    none recent  . Unspecified hypotension 01/30/2010    Past Surgical History:  Procedure Laterality Date  . A/V SHUNTOGRAM N/A 04/07/2018   Procedure: A/V SHUNTOGRAM;  Surgeon: Waynetta Sandy, MD;  Location:  [place] INVASIVE CV LAB;  Service: Cardiovascular;  Laterality: N/A;  . ARTERIOVENOUS GRAFT PLACEMENT Right 2009   forearm/notes 02/01/2011  . AV FISTULA PLACEMENT  11/07/2011   Procedure: INSERTION OF ARTERIOVENOUS (AV) GORE-TEX GRAFT ARM;  Surgeon: Tinnie Gens, MD;  Location: [place];  Service: Vascular;  Laterality: Left;  . BACK SURGERY  1998  . BASCILIC VEIN  TRANSPOSITION Right 02/27/2013   Procedure: BASCILIC VEIN TRANSPOSITION;  Surgeon: Mal Misty, MD;  Location: [place];  Service: Vascular;  Laterality: Right;  Right Basilic Vein Transposition   . CARDIAC CATHETERIZATION N/A 08/06/2016   Procedure: Left Heart Cath and Coronary Angiography;  Surgeon: Jolaine Artist, MD;  Location: [place] CV LAB;  Service: Cardiovascular;  Laterality: N/A;  . CARDIAC CATHETERIZATION N/A 08/07/2016   Procedure: Coronary/Graft Atherectomy-CSI LAD;  Surgeon: Peter M Martinique, MD;  Location: [place] CV LAB;  Service: Cardiovascular;  Laterality: N/A;  . CERVICAL SPINE SURGERY  2/09   "to repair nerve problems in my left arm"  . CHOLECYSTECTOMY    . COLONOSCOPY WITH PROPOFOL N/A 04/26/2017   Procedure: COLONOSCOPY WITH PROPOFOL;  Surgeon: Otis Brace, MD;  Location: [place];  Service: Gastroenterology;  Laterality: N/A;  . CORONARY ANGIOPLASTY WITH STENT PLACEMENT  06/11/2008  . CORONARY ANGIOPLASTY WITH STENT PLACEMENT  06/2007   TAXUS stent to RCA/notes 01/31/2011  . ESOPHAGOGASTRODUODENOSCOPY  09/28/2011   Procedure: ESOPHAGOGASTRODUODENOSCOPY (EGD);  Surgeon: Missy Sabins, MD;  [facility] ENDOSCOPY;  Service: Endoscopy;  Laterality: N/A;  . ESOPHAGOGASTRODUODENOSCOPY N/A 04/07/2015   Procedure: ESOPHAGOGASTRODUODENOSCOPY (EGD);  Surgeon: Teena Irani, MD;  Location: [facility];  Service: Endoscopy;  Laterality: N/A;  . ESOPHAGOGASTRODUODENOSCOPY N/A 04/19/2015   Procedure: ESOPHAGOGASTRODUODENOSCOPY (EGD);  Surgeon: Arta Silence, MD;  Location: [facility] ENDOSCOPY;  Service: Endoscopy;  Laterality: N/A;  . ESOPHAGOGASTRODUODENOSCOPY (EGD) WITH PROPOFOL N/A 06/13/2018   Procedure: ESOPHAGOGASTRODUODENOSCOPY (EGD) WITH PROPOFOL;  Surgeon: Wonda Horner, MD;  Location: [facility] ENDOSCOPY;  Service: Endoscopy;  Laterality: N/A;  . FLEXIBLE SIGMOIDOSCOPY N/A 05/21/2017   Procedure: Conni Elliot;  Surgeon: Clarene Essex, MD;  Location: [place];   Service: Endoscopy;  Laterality: N/A;  . FLEXIBLE SIGMOIDOSCOPY Left 07/02/2017   Procedure: FLEXIBLE SIGMOIDOSCOPY;  Surgeon: Laurence Spates, MD;  Location: [place];  Service: Endoscopy;  Laterality: Left;  . FOREIGN BODY REMOVAL  09/2003   via upper endoscopy/notes 02/12/2011  . GIVENS CAPSULE STUDY  09/30/2011   Procedure: GIVENS CAPSULE STUDY;  Surgeon: Jeryl Columbia, MD;  Location: [facility] ENDOSCOPY;  Service: Endoscopy;  Laterality: N/A;  . INSERTION OF DIALYSIS CATHETER Right 2014  . INSERTION OF DIALYSIS CATHETER Left 02/11/2013   Procedure: INSERTION OF DIALYSIS CATHETER;  Surgeon: Conrad [place], MD;  Location: [place];  Service: Vascular;  Laterality: Left;  Ultrasound guided  . LUMBAR LAMINECTOMY/DECOMPRESSION MICRODISCECTOMY Bilateral 07/31/2017   Procedure: LAMINECTOMY AND FORAMINOTOMY- BILATERAL LUMBAR TWO- LUMBAR THREE;  Surgeon: Earnie Larsson, MD;  Location: [place];  Service: Neurosurgery;  Laterality: Bilateral;  LAMINECTOMY AND FORAMINOTOMY- BILATERAL LUMBAR 2- LUMBAR 3  . PERIPHERAL VASCULAR BALLOON ANGIOPLASTY  04/07/2018   Procedure: PERIPHERAL VASCULAR BALLOON ANGIOPLASTY;  Surgeon: Waynetta Sandy, MD;  Location: [place] CV LAB;  Service: Cardiovascular;;  RUE AVF  . REMOVAL OF A DIALYSIS CATHETER Right 02/11/2013   Procedure: REMOVAL OF A DIALYSIS CATHETER;  Surgeon: Conrad [place], MD;  Location: [place];  Service: Vascular;  Laterality: Right;  . SAVORY DILATION N/A 04/07/2015   Procedure: SAVORY DILATION;  Surgeon: Teena Irani,  MD;  Location: [facility] ENDOSCOPY;  Service: Endoscopy;  Laterality: N/A;  . SHUNTOGRAM N/A 09/20/2011   Procedure: Earney Mallet;  Surgeon: Conrad [place], MD;  Location: [facility] CATH LAB;  Service: Cardiovascular;  Laterality: N/A;  . SVT ABLATION N/A 11/26/2016   Procedure: SVT Ablation;  Surgeon: Will Meredith Leeds, MD;  Location: [place] CV LAB;  Service: Cardiovascular;  Laterality: N/A;  . TEE WITHOUT CARDIOVERSION N/A 08/25/2018   Procedure:  TRANSESOPHAGEAL ECHOCARDIOGRAM (TEE);  Surgeon: Dorothy Spark, MD;  Location: [facility] ENDOSCOPY;  Service: Cardiovascular;  Laterality: N/A;  . TONSILLECTOMY    . TOTAL KNEE ARTHROPLASTY Right 08/02/2015   Procedure: TOTAL KNEE ARTHROPLASTY;  Surgeon: Renette Butters, MD;  Location: [place];  Service: Orthopedics;  Laterality: Right;  . VENOGRAM N/A 01/26/2013   Procedure: VENOGRAM;  Surgeon: Angelia Mould, MD;  Location: [facility] CATH LAB;  Service: Cardiovascular;  Laterality: N/A;    Prior to Admission medications   Medication Sig Start Date End Date Taking? Authorizing Provider  acetaminophen (TYLENOL) 325 MG tablet Take 650 mg by mouth every 6 (six) hours as needed for mild pain or headache.    Yes [provider]  acetaminophen-codeine (TYLENOL #3) 300-30 MG tablet Take 1-2 tablets by mouth 4 (four) times daily as needed. 11/05/18  Yes [provider]  Alirocumab (PRALUENT) 150 MG/ML SOPN Inject 150 mg into the skin every 14 (fourteen) days. 03/05/18  Yes Lorretta Harp, MD  allopurinol (ZYLOPRIM) 100 MG tablet TAKE ONE TABLET BY MOUTH ONCE DAILY Patient taking differently: Take 100 mg by mouth daily.  10/14/17  Yes Biagio Borg, MD  aspirin EC 325 MG tablet Take 1 tablet (325 mg total) by mouth daily. 10/10/18  Yes Hongalgi, Lenis Dickinson, MD  betamethasone dipropionate (DIPROLENE) 0.05 % cream Apply 1 application topically daily as needed (leg sores).   Yes [provider]  citalopram (CELEXA) 10 MG tablet Take 1 tablet (10 mg total) by mouth at bedtime. 10/14/17  Yes Biagio Borg, MD  colchicine 0.6 MG tablet Take 1 tablet (0.6 mg total) by mouth daily. 08/08/18  Yes Biagio Borg, MD  doxercalciferol ([name]) 4 MCG/2ML injection Inject 1 mL (2 mcg total) into the vein Every Tuesday,Thursday,and Saturday with dialysis. 09/02/18  Yes Hall, Archie Patten N, DO  ezetimibe (ZETIA) 10 MG tablet TAKE ONE TABLET BY MOUTH ONCE DAILY Patient taking differently: Take 10 mg by mouth  daily.  04/08/17  Yes Bensimhon, Shaune Pascal, MD  ferric citrate (AURYXIA) 1 GM 210 MG(Fe) tablet Take 2 tablets (420 mg total) by mouth 3 (three) times daily with meals. 08/30/18  Yes Irene Pap N, DO  gabapentin (NEURONTIN) 100 MG capsule Take 1 capsule (100 mg total) by mouth at bedtime. 08/31/18  Yes Kayleen Memos, DO  hydroxypropyl methylcellulose / hypromellose (ISOPTO TEARS / GONIOVISC) 2.5 % ophthalmic solution Place 1 drop into both eyes 3 (three) times daily as needed for dry eyes. 05/03/17  Yes Biagio Borg, MD  methimazole (TAPAZOLE) 5 MG tablet Take 0.5 tablets (2.5 mg total) by mouth daily. 05/16/18  Yes Renato Shin, MD  multivitamin (RENA-VIT) TABS tablet Take 1 tablet by mouth daily.    Yes [provider]  Nutritional Supplements (FEEDING SUPPLEMENT, NEPRO CARB STEADY,) LIQD Take 237 mLs by mouth 2 (two) times daily between meals. 08/30/18  Yes Hall, Carole N, DO  pantoprazole (PROTONIX) 40 MG tablet Take 40 mg by mouth at bedtime.  11/03/18  Yes [provider]  polyethylene glycol (MIRALAX / GLYCOLAX) packet Take 17 g by mouth daily. Patient taking differently: Take 17 g by mouth daily as needed for mild constipation or moderate constipation.  05/23/17  Yes Florencia Reasons, MD  SENSIPAR 90 MG tablet Take 90 mg by mouth 2 (two) times daily. 09/18/16  Yes [provider]  vancomycin (VANCOCIN) 125 MG capsule Take 1 capsule (125 mg total) by mouth 2 (two) times daily. 10/28/18  Yes [place] Callas, NP  WELCHOL 625 MG tablet TAKE 3 TABLETS BY MOUTH TWICE DAILY WITH MEALS Patient taking differently: Take 1,875 mg by mouth 2 (two) times daily with a meal.  11/07/18  Yes Biagio Borg, MD  zolpidem (AMBIEN) 5 MG tablet Take 1 tablet (5 mg total) by mouth at bedtime as needed. for sleep Patient taking differently: Take 5 mg by mouth at bedtime as needed for sleep.  06/20/18  Yes Biagio Borg, MD    Current Facility-Administered Medications  Medication Dose Route Frequency  Provider Last Rate Last Dose  . 0.9 %  sodium chloride infusion  250 mL Intravenous Continuous Caroline More, DO   Stopped at 11/11/18 2355  . allopurinol (ZYLOPRIM) tablet 100 mg  100 mg Oral Daily Sherin, Tammi Klippel, DO   100 mg at 11/13/18 1023  . aspirin chewable tablet 324 mg  324 mg Oral Daily Ronnette Juniper, MD   Stopped at 11/14/18 [phone]  . ceFAZolin (ANCEF) IVPB 1 g/50 mL premix  1 g Intravenous Q24H Lore, Melissa A, RPH   Stopped at 11/14/18 0020  . Chlorhexidine Gluconate Cloth 2 % PADS 6 each  6 each Topical Q0600 [name], MD   6 each at 11/14/18 0533  . citalopram (CELEXA) tablet 10 mg  10 mg Oral QHS Jonetta Osgood, MD   10 mg at 11/13/18 2147  . colchicine tablet 0.6 mg  0.6 mg Oral Daily Jonetta Osgood, MD   0.6 mg at 11/13/18 1519  . colesevelam [facility]) tablet 1,875 mg  1,875 mg Oral BID WC Ghimire, Henreitta Leber, MD      . ezetimibe (ZETIA) tablet 10 mg  10 mg Oral Daily Sherin, Abraham, DO   10 mg at 11/13/18 1021  . gabapentin (NEURONTIN) capsule 300 mg  300 mg Oral QHS Jonetta Osgood, MD   300 mg at 11/13/18 2148  . hydrocortisone sodium succinate (SOLU-CORTEF) 100 MG injection 50 mg  50 mg Intravenous Q8H Sherin, Abraham, DO   50 mg at 11/13/18 2340  . insulin aspart (novoLOG) injection 0-9 Units  0-9 Units Subcutaneous TID WC Caroline More, DO   1 Units at 11/12/18 [phone]  . methimazole (TAPAZOLE) tablet 2.5 mg  2.5 mg Oral Daily Sherin, Abraham, DO   2.5 mg at 11/13/18 1248  . midodrine (PROAMATINE) tablet 5 mg  5 mg Oral TID WC Sherin, Abraham, DO   5 mg at 11/13/18 1659  . vancomycin (VANCOCIN) 50 mg/mL oral solution 125 mg  125 mg Oral Q12H Caroline More, DO   125 mg at 11/13/18 2148    Allergies as of 11/11/2018 - Review Complete 11/11/2018  Allergen Reaction Noted  . Statins Other (See Comments) 06/03/2009  . Ciprofloxacin Rash 08/02/2015    Family History  Problem Relation Age of Onset  . Heart disease Sister   . Thyroid nodules Sister   .  Heart disease Father   . Diabetes Father   . Kidney failure Father   . Hypertension Father   . Healthy Child   .  Healthy Child   . Healthy Child   . Cancer Neg Hx     Social History   Socioeconomic History  . Marital status: Married    Spouse name: Not on file  . Number of children: 3  . Years of education: 56  . Highest education level: Not on file  Occupational History  . Occupation: disabled    Employer: DISABLED  . Occupation: formerly Lawyer for [employer].  Social Needs  . Financial resource strain: Not on file  . Food insecurity:    Worry: Not on file    Inability: Not on file  . Transportation needs:    Medical: Not on file    Non-medical: Not on file  Tobacco Use  . Smoking status: Former Smoker    Packs/day: 1.00    Years: 25.00    Pack years: 25.00    Types: Cigarettes    Last attempt to quit: 10/01/2005    Years since quitting: 13.1  . Smokeless tobacco: Former Systems developer    Quit date: 10/01/2005  . Tobacco comment: Quit smoking 2007 Smoked x 25 years 1/2 ppd.  Substance and Sexual Activity  . Alcohol use: No    Alcohol/week: 0.0 standard drinks  . Drug use: No  . Sexual activity: Never  Lifestyle  . Physical activity:    Days per week: 2 days    Minutes per session: 10 min  . Stress: Only a little  Relationships  . Social connections:    Talks on phone: Three times a week    Gets together: Once a week    Attends religious service: 1 to 4 times per year    Active member of club or organization: No    Attends meetings of clubs or organizations: Never    Relationship status: Married  . Intimate partner violence:    Fear of current or ex partner: Not on file    Emotionally abused: Not on file    Physically abused: Not on file    Forced sexual activity: Not on file  Other Topics Concern  . Not on file  Social History Narrative   Lives with wife   Caffeine use: Tea sometimes   No coffee   Left handed     Review of Systems: Positive  for: GI: Described in detail in HPI.    Gen: fatigue, weakness, malaise,fever,Denies any  chills, rigors, night sweats, anorexia,  involuntary weight loss and sleep disorder CV: Denies chest pain, angina, palpitations, syncope, orthopnea, PND, peripheral edema, and claudication. Resp: Denies dyspnea, cough, sputum, wheezing, coughing up blood. GU : Denies urinary burning, blood in urine, urinary frequency, urinary hesitancy, nocturnal urination, and urinary incontinence. MS: Denies joint pain or swelling.  Denies muscle weakness, cramps, atrophy.  Derm: Denies rash, itching, oral ulcerations, hives, unhealing ulcers.  Psych: Denies depression, anxiety, memory loss, suicidal ideation, hallucinations,  and confusion. Heme: Denies bruising, bleeding, and enlarged lymph nodes. Neuro:  Denies any headaches, dizziness, paresthesias. Endo: DM,  Hypothyroid, Denies any problems with adrenal function.  Physical Exam: Vital signs in last 24 hours: Temp:  [97.5 F (36.4 C)-97.7 F (36.5 C)] 97.7 F (36.5 C) (02/14 0509) Pulse Rate:  [66-70] 67 (02/14 0510) Resp:  [16-18] 18 (02/14 0509) BP: (134-166)/(64-86) 166/81 (02/14 0510) SpO2:  [96 %-99 %] 96 % (02/14 0509) Weight:  [104.3 kg-106.4 kg] 106.4 kg (02/14 0510) Last BM Date: 11/13/18  General:   Alert,  Well-developed, obese, pleasant and cooperative in NAD Head:  Normocephalic  and atraumatic. Eyes:  Sclera clear, no icterus.   Conjunctiva pink.Puffy eyes Ears:  Normal auditory acuity. Nose:  No deformity, discharge,  or lesions. Mouth:  No deformity or lesions.  Oropharynx pink & moist. Neck:  Supple; no masses or thyromegaly. Lungs:  Clear throughout to auscultation.   No wheezes, crackles, or rhonchi. No acute distress. Heart:  Regular rate and rhythm; no murmurs, clicks, rubs,  or gallops. Extremities: AV Fistula. Neurologic:  Alert and  oriented x4;  grossly normal neurologically. Skin:  Intact without significant lesions or  rashes. Psych:  Alert and cooperative. Normal mood and affect. Abdomen:  Soft, nontender and nondistended. No masses, hepatosplenomegaly or hernias noted. Normal bowel sounds, without guarding, and without rebound.         Lab Results: Recent Labs    11/12/18 0309 11/13/18 0357 11/14/18 0539  WBC 18.7* 12.5* 8.5  HGB 11.4* 10.6* 11.0*  HCT 37.4* 34.1* 35.6*  PLT 216 189 198   BMET Recent Labs    11/12/18 1557 11/13/18 0357 11/14/18 0539  NA 141 140 141  K 5.5* 5.4* 5.1  CL 106 105 107  CO2 21* 18* 19*  GLUCOSE 111* 110* 97  BUN 52* 56* 72*  CREATININE 10.77* 11.60* 13.15*  CALCIUM 8.3* 7.9* 7.9*   LFT Recent Labs    11/12/18 0309  11/14/18 0539  PROT 6.6  --   --   ALBUMIN 2.8*   < > 2.6*  AST 224*  --   --   ALT 230*  --   --   ALKPHOS 170*  --   --   BILITOT 2.4*  --   --   BILIDIR 1.6*  --   --   IBILI 0.8  --   --    < > = values in this interval not displayed.   PT/INR Recent Labs    11/14/18 0539  LABPROT 13.0  INR 0.99    Studies/Results: Mr Abdomen Mrcp Wo Contrast  Result Date: 11/13/2018 CLINICAL DATA:  Sepsis.  History of cholecystectomy. EXAM: MRI ABDOMEN WITHOUT CONTRAST  (INCLUDING MRCP) TECHNIQUE: Multiplanar multisequence MR imaging of the abdomen was performed. Heavily T2-weighted images of the biliary and pancreatic ducts were obtained, and three-dimensional MRCP images were rendered by post processing. COMPARISON:  CT scan 11/11/2018 FINDINGS: Lower chest: Bibasilar atelectasis. No pleural or pericardial effusions. Hepatobiliary: Intra and extrahepatic biliary dilatation is demonstrated. The common bile duct measures up to 14 mm. There is an 8 mm distal common bile duct stone along with a few other smaller stones layering dependently. Small calculi are also noted in the cystic duct remnant. There is also non dependent pneumobilia noted. Pancreas:  No mass, inflammation or ductal dilatation. Spleen:  Normal size.  No focal lesions.  Adrenals/Urinary Tract: The adrenal glands are unremarkable and stable. The kidneys are small and there are numerous cysts, typical findings related to dialysis. Stomach/Bowel: Visualized portions within the abdomen are unremarkable. Vascular/Lymphatic: No pathologically enlarged lymph nodes identified. No abdominal aortic aneurysm demonstrated. Other:  No ascites or abdominal wall hernia. Musculoskeletal: No significant bony findings. Spinal fusion hardware is noted. IMPRESSION: 1. 8 mm distal common bile duct stones along with a few other small common bile duct stones. I suspect there also small stones in the cystic duct remnant. There is also pneumobilia with some non dependent air bubbles in the biliary tree. 2. Unremarkable MR appearance of the pancreas. 3. Small/atrophied kidneys with numerous cysts. Electronically Signed   By: Mamie Nick.  Gallerani M.D.   On: 11/13/2018 20:53   Mr 3d Recon At Scanner  Result Date: 11/13/2018 CLINICAL DATA:  Sepsis.  History of cholecystectomy. EXAM: MRI ABDOMEN WITHOUT CONTRAST  (INCLUDING MRCP) TECHNIQUE: Multiplanar multisequence MR imaging of the abdomen was performed. Heavily T2-weighted images of the biliary and pancreatic ducts were obtained, and three-dimensional MRCP images were rendered by post processing. COMPARISON:  CT scan 11/11/2018 FINDINGS: Lower chest: Bibasilar atelectasis. No pleural or pericardial effusions. Hepatobiliary: Intra and extrahepatic biliary dilatation is demonstrated. The common bile duct measures up to 14 mm. There is an 8 mm distal common bile duct stone along with a few other smaller stones layering dependently. Small calculi are also noted in the cystic duct remnant. There is also non dependent pneumobilia noted. Pancreas:  No mass, inflammation or ductal dilatation. Spleen:  Normal size.  No focal lesions. Adrenals/Urinary Tract: The adrenal glands are unremarkable and stable. The kidneys are small and there are numerous cysts, typical  findings related to dialysis. Stomach/Bowel: Visualized portions within the abdomen are unremarkable. Vascular/Lymphatic: No pathologically enlarged lymph nodes identified. No abdominal aortic aneurysm demonstrated. Other:  No ascites or abdominal wall hernia. Musculoskeletal: No significant bony findings. Spinal fusion hardware is noted. IMPRESSION: 1. 8 mm distal common bile duct stones along with a few other small common bile duct stones. I suspect there also small stones in the cystic duct remnant. There is also pneumobilia with some non dependent air bubbles in the biliary tree. 2. Unremarkable MR appearance of the pancreas. 3. Small/atrophied kidneys with numerous cysts. Electronically Signed   By: Marijo Sanes M.D.   On: 11/13/2018 20:53    Impression: CBD stones, history of Klebsiella pneumonia bacteremia, fever and hypotension on presentation LFTs trending down, T bili 1 from 2.4, ALP 126 from 170, AST 31 from 224, AST 95 from 230,possible passed CBD stone  End-stage renal disease on hemodialysis, was hemodialysis session yesterday,spoke with Dr.Schertz from nephrology, doesn't appear volume overloaded and has normal potassium, he is ok with proceeding with ERCP prior to HD.  History of radiation proctitis treated with APC and RFA and esophageal stricture dilated in 2016  Multiple comorbidities-history of C. Difficile colitis, diabetes, hypothyroidism, obstructive sleep apnea on CPAP,history of prostate cancer status post radiation therapy, peripheral neuropathy, gout, CVA, depression  Plan: Proceed with an ERCP today. The patient was on aspirin 324 mg, last dose yesterday. Discussed about the risks(bleeding, infection, perforation, pancreatitis and even death) and benefits were discussed with the patient in details. He understands and verbalizes consent.   LOS: 3 days   Ronnette Juniper, M.D.  11/14/2018, 9:34 AM  Pager [phone] If no answer or after 5 PM call [phone]

## 2018-11-14 NOTE — Sedation Documentation (Signed)
IV left forearm sluggish unable to use for procedure. Two nurses attempted IV unable to obtain. IV team consulted.

## 2018-11-14 NOTE — Consult Note (Signed)
[facility] Consult  Referring Provider: Jonetta Osgood, MD/[facility] Primary Care Physician:  Biagio Borg, MD Primary Gastroenterologist: [facility]/Dr.Outlaw  Reason for Consultation:  CBD stones  HPI: [name] is a 68 y.o. male was admitted on 11/11/2018 from hemodialysis for hypotension(SBP in 33s), fever(102F) and abdominal pain, subsequently treated for septic shock. He has history of klebsiella pneumoniae bacteremia on 10/04/2018.  MRCP yesterday which showed intra-and extrahepatic biliary dilatation, CBD up to 14 mm, 8 mm distal CBD stone along with a few other smaller stones layering dependently, small calculi also noted in cystic duct remnant.  He had a cholecystectomy in 2011, denies yellowish discoloration of skin or change in the color of stool or urine.  In the past has been treated for radiation proctitis with APC as well as RFA.  History of dilatation of esophageal stricturing 2016.  He tested positive for C. Difficile in 08/21/18,reports worsening of diarrhea with use of antibiotics and is continued on vancomycin 125 mg every 12 hours.  He missed his hemodialysis yesterday,as AV fistula was not accessible and is going downstairs to IR currently for fistulogram with possible intervention.  Past Medical History:  Diagnosis Date  . Acute encephalopathy 10/04/2018  . Allergic rhinitis, cause unspecified 02/24/2014  . Anemia 06/16/2011  . BENIGN PROSTATIC HYPERTROPHY 10/14/2009  . CAD, NATIVE VESSEL 02/06/2009   a. 06/2007 s/p Taxus DES to the RCA;  b. 08/2016 NSTEMI in setting of SVT/PCI: LM 30ost, LAD 79m (3.0x16 Synergy DES), LCX 47m, OM1 60, RI 40, RCA 70p/m, 73m - not amenable to PCI.  [name] Cervical radiculopathy, chronic 02/23/2016   Right c5-6 by NCS/EMG  . Chest pain 08/09/2015  . Chronic combined systolic (congestive) and diastolic (congestive) heart failure ([place])    a. 10/2016 Echo: EF 40-45%, Gr2 DD. mildly dil LA.  [name] COLONIC POLYPS, HX OF  10/14/2009  . Dementia ([place]) [phone]  . Depression 09/24/2015  . DIABETES MELLITUS, TYPE II 02/01/2010  . DYSLIPIDEMIA 06/18/2007  . ESRD (end stage renal disease) on dialysis ([place]) 08/04/2010   ESRD due to DM adn HTN, started HD in 2011. As of 2019 has a R arm graft and gets HD on TTS sched  . FOOT PAIN 08/12/2008  . GAIT DISTURBANCE 03/03/2010  . GASTROENTERITIS, VIRAL 10/14/2009  . GERD 06/18/2007  . GOITER, MULTINODULAR 12/26/2007  . GOUT 06/18/2007  . GYNECOMASTIA 07/17/2010  . Hemodialysis access, fistula mature [facility])    Dialysis T-Th-Sa ([place]) Right upper arm fistula  . Hyperlipidemia 10/16/2011  . Hyperparathyroidism, secondary ([place]) 06/16/2011  . HYPERTENSION 06/18/2007  . Hyperthyroidism   . Hypocalcemia 06/07/2010  . Ischemic cardiomyopathy    a. 10/2016 Echo: EF 40-45%.  . Lumbar stenosis with neurogenic claudication   . ONYCHOMYCOSIS, TOENAILS 12/26/2007  . OSA on CPAP 10/16/2011  . Other malaise and fatigue 11/24/2009  . PERIPHERAL NEUROPATHY 06/18/2007  . Prostate cancer ([place])   . PSVT (paroxysmal supraventricular tachycardia) ([place])    a. 32/5498 complicated by NSTEMI;  b. 11/2016 Treated w/ adenosine in ED;  c. 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  [name] PULMONARY NODULE, RIGHT LOWER LOBE 06/08/2009  . Sleep apnea    cpap machine and o2  . TRANSAMINASES, SERUM, ELEVATED 02/01/2010  . Transfusion history    none recent  . Unspecified hypotension 01/30/2010    Past Surgical History:  Procedure Laterality Date  . A/V SHUNTOGRAM N/A 04/07/2018   Procedure: A/V SHUNTOGRAM;  Surgeon: Waynetta Sandy, MD;  Location:  [place] INVASIVE CV LAB;  Service: Cardiovascular;  Laterality: N/A;  . ARTERIOVENOUS GRAFT PLACEMENT Right 2009   forearm/notes 02/01/2011  . AV FISTULA PLACEMENT  11/07/2011   Procedure: INSERTION OF ARTERIOVENOUS (AV) GORE-TEX GRAFT ARM;  Surgeon: Tinnie Gens, MD;  Location: [place];  Service: Vascular;  Laterality: Left;  . BACK SURGERY  1998  . BASCILIC VEIN  TRANSPOSITION Right 02/27/2013   Procedure: BASCILIC VEIN TRANSPOSITION;  Surgeon: Mal Misty, MD;  Location: [place];  Service: Vascular;  Laterality: Right;  Right Basilic Vein Transposition   . CARDIAC CATHETERIZATION N/A 08/06/2016   Procedure: Left Heart Cath and Coronary Angiography;  Surgeon: Jolaine Artist, MD;  Location: [place] CV LAB;  Service: Cardiovascular;  Laterality: N/A;  . CARDIAC CATHETERIZATION N/A 08/07/2016   Procedure: Coronary/Graft Atherectomy-CSI LAD;  Surgeon: Peter M Martinique, MD;  Location: [place] CV LAB;  Service: Cardiovascular;  Laterality: N/A;  . CERVICAL SPINE SURGERY  2/09   "to repair nerve problems in my left arm"  . CHOLECYSTECTOMY    . COLONOSCOPY WITH PROPOFOL N/A 04/26/2017   Procedure: COLONOSCOPY WITH PROPOFOL;  Surgeon: Otis Brace, MD;  Location: [place];  Service: Gastroenterology;  Laterality: N/A;  . CORONARY ANGIOPLASTY WITH STENT PLACEMENT  06/11/2008  . CORONARY ANGIOPLASTY WITH STENT PLACEMENT  06/2007   TAXUS stent to RCA/notes 01/31/2011  . ESOPHAGOGASTRODUODENOSCOPY  09/28/2011   Procedure: ESOPHAGOGASTRODUODENOSCOPY (EGD);  Surgeon: Missy Sabins, MD;  Location: [facility] ENDOSCOPY;  Service: Endoscopy;  Laterality: N/A;  . ESOPHAGOGASTRODUODENOSCOPY N/A 04/07/2015   Procedure: ESOPHAGOGASTRODUODENOSCOPY (EGD);  Surgeon: Teena Irani, MD;  Location: [facility];  Service: Endoscopy;  Laterality: N/A;  . ESOPHAGOGASTRODUODENOSCOPY N/A 04/19/2015   Procedure: ESOPHAGOGASTRODUODENOSCOPY (EGD);  Surgeon: Arta Silence, MD;  Location: [facility] ENDOSCOPY;  Service: Endoscopy;  Laterality: N/A;  . ESOPHAGOGASTRODUODENOSCOPY (EGD) WITH PROPOFOL N/A 06/13/2018   Procedure: ESOPHAGOGASTRODUODENOSCOPY (EGD) WITH PROPOFOL;  Surgeon: Wonda Horner, MD;  Location: [facility] ENDOSCOPY;  Service: Endoscopy;  Laterality: N/A;  . FLEXIBLE SIGMOIDOSCOPY N/A 05/21/2017   Procedure: Conni Elliot;  Surgeon: Clarene Essex, MD;  Location: [place];   Service: Endoscopy;  Laterality: N/A;  . FLEXIBLE SIGMOIDOSCOPY Left 07/02/2017   Procedure: FLEXIBLE SIGMOIDOSCOPY;  Surgeon: Laurence Spates, MD;  Location: [place];  Service: Endoscopy;  Laterality: Left;  . FOREIGN BODY REMOVAL  09/2003   via upper endoscopy/notes 02/12/2011  . GIVENS CAPSULE STUDY  09/30/2011   Procedure: GIVENS CAPSULE STUDY;  Surgeon: Jeryl [place], MD;  Location: [facility] ENDOSCOPY;  Service: Endoscopy;  Laterality: N/A;  . INSERTION OF DIALYSIS CATHETER Right 2014  . INSERTION OF DIALYSIS CATHETER Left 02/11/2013   Procedure: INSERTION OF DIALYSIS CATHETER;  Surgeon: Conrad Cumming, MD;  Location: [place];  Service: Vascular;  Laterality: Left;  Ultrasound guided  . LUMBAR LAMINECTOMY/DECOMPRESSION MICRODISCECTOMY Bilateral 07/31/2017   Procedure: LAMINECTOMY AND FORAMINOTOMY- BILATERAL LUMBAR TWO- LUMBAR THREE;  Surgeon: Earnie Larsson, MD;  Location: [place];  Service: Neurosurgery;  Laterality: Bilateral;  LAMINECTOMY AND FORAMINOTOMY- BILATERAL LUMBAR 2- LUMBAR 3  . PERIPHERAL VASCULAR BALLOON ANGIOPLASTY  04/07/2018   Procedure: PERIPHERAL VASCULAR BALLOON ANGIOPLASTY;  Surgeon: Waynetta Sandy, MD;  Location: [place] CV LAB;  Service: Cardiovascular;;  RUE AVF  . REMOVAL OF A DIALYSIS CATHETER Right 02/11/2013   Procedure: REMOVAL OF A DIALYSIS CATHETER;  Surgeon: Conrad [place], MD;  Location: [place];  Service: Vascular;  Laterality: Right;  . SAVORY DILATION N/A 04/07/2015   Procedure: SAVORY DILATION;  Surgeon: Teena Irani,  MD;  Location: [facility] ENDOSCOPY;  Service: Endoscopy;  Laterality: N/A;  . SHUNTOGRAM N/A 09/20/2011   Procedure: Earney Mallet;  Surgeon: Conrad Carrollton, MD;  Location: [facility] CATH LAB;  Service: Cardiovascular;  Laterality: N/A;  . SVT ABLATION N/A 11/26/2016   Procedure: SVT Ablation;  Surgeon: Will Meredith Leeds, MD;  Location: [place] CV LAB;  Service: Cardiovascular;  Laterality: N/A;  . TEE WITHOUT CARDIOVERSION N/A 08/25/2018   Procedure:  TRANSESOPHAGEAL ECHOCARDIOGRAM (TEE);  Surgeon: Dorothy Spark, MD;  Location: [facility] ENDOSCOPY;  Service: Cardiovascular;  Laterality: N/A;  . TONSILLECTOMY    . TOTAL KNEE ARTHROPLASTY Right 08/02/2015   Procedure: TOTAL KNEE ARTHROPLASTY;  Surgeon: Renette Butters, MD;  Location: [name];  Service: Orthopedics;  Laterality: Right;  . VENOGRAM N/A 01/26/2013   Procedure: VENOGRAM;  Surgeon: Angelia Mould, MD;  Location: [facility] CATH LAB;  Service: Cardiovascular;  Laterality: N/A;    Prior to Admission medications   Medication Sig Start Date End Date Taking? Authorizing Provider  acetaminophen (TYLENOL) 325 MG tablet Take 650 mg by mouth every 6 (six) hours as needed for mild pain or headache.    Yes [provider]  acetaminophen-codeine (TYLENOL #3) 300-30 MG tablet Take 1-2 tablets by mouth 4 (four) times daily as needed. 11/05/18  Yes [provider]  Alirocumab (PRALUENT) 150 MG/ML SOPN Inject 150 mg into the skin every 14 (fourteen) days. 03/05/18  Yes Lorretta Harp, MD  allopurinol (ZYLOPRIM) 100 MG tablet TAKE ONE TABLET BY MOUTH ONCE DAILY Patient taking differently: Take 100 mg by mouth daily.  10/14/17  Yes Biagio Borg, MD  aspirin EC 325 MG tablet Take 1 tablet (325 mg total) by mouth daily. 10/10/18  Yes Hongalgi, Lenis Dickinson, MD  betamethasone dipropionate (DIPROLENE) 0.05 % cream Apply 1 application topically daily as needed (leg sores).   Yes [provider]  citalopram (CELEXA) 10 MG tablet Take 1 tablet (10 mg total) by mouth at bedtime. 10/14/17  Yes Biagio Borg, MD  colchicine 0.6 MG tablet Take 1 tablet (0.6 mg total) by mouth daily. 08/08/18  Yes Biagio Borg, MD  doxercalciferol ([name]) 4 MCG/2ML injection Inject 1 mL (2 mcg total) into the vein Every Tuesday,Thursday,and Saturday with dialysis. 09/02/18  Yes Hall, Archie Patten N, DO  ezetimibe (ZETIA) 10 MG tablet TAKE ONE TABLET BY MOUTH ONCE DAILY Patient taking differently: Take 10 mg by mouth  daily.  04/08/17  Yes Bensimhon, Shaune Pascal, MD  ferric citrate (AURYXIA) 1 GM 210 MG(Fe) tablet Take 2 tablets (420 mg total) by mouth 3 (three) times daily with meals. 08/30/18  Yes Irene Pap N, DO  gabapentin (NEURONTIN) 100 MG capsule Take 1 capsule (100 mg total) by mouth at bedtime. 08/31/18  Yes Kayleen Memos, DO  hydroxypropyl methylcellulose / hypromellose (ISOPTO TEARS / GONIOVISC) 2.5 % ophthalmic solution Place 1 drop into both eyes 3 (three) times daily as needed for dry eyes. 05/03/17  Yes Biagio Borg, MD  methimazole (TAPAZOLE) 5 MG tablet Take 0.5 tablets (2.5 mg total) by mouth daily. 05/16/18  Yes Renato Shin, MD  multivitamin (RENA-VIT) TABS tablet Take 1 tablet by mouth daily.    Yes [provider]  Nutritional Supplements (FEEDING SUPPLEMENT, NEPRO CARB STEADY,) LIQD Take 237 mLs by mouth 2 (two) times daily between meals. 08/30/18  Yes Hall, Carole N, DO  pantoprazole (PROTONIX) 40 MG tablet Take 40 mg by mouth at bedtime.  11/03/18  Yes [provider]  polyethylene glycol (MIRALAX / GLYCOLAX) packet Take 17 g by mouth daily. Patient taking differently: Take 17 g by mouth daily as needed for mild constipation or moderate constipation.  05/23/17  Yes Florencia Reasons, MD  SENSIPAR 90 MG tablet Take 90 mg by mouth 2 (two) times daily. 09/18/16  Yes [provider]  vancomycin (VANCOCIN) 125 MG capsule Take 1 capsule (125 mg total) by mouth 2 (two) times daily. 10/28/18  Yes [place] Run Callas, NP  WELCHOL 625 MG tablet TAKE 3 TABLETS BY MOUTH TWICE DAILY WITH MEALS Patient taking differently: Take 1,875 mg by mouth 2 (two) times daily with a meal.  11/07/18  Yes Biagio Borg, MD  zolpidem (AMBIEN) 5 MG tablet Take 1 tablet (5 mg total) by mouth at bedtime as needed. for sleep Patient taking differently: Take 5 mg by mouth at bedtime as needed for sleep.  06/20/18  Yes Biagio Borg, MD    Current Facility-Administered Medications  Medication Dose Route Frequency  Provider Last Rate Last Dose  . 0.9 %  sodium chloride infusion  250 mL Intravenous Continuous Caroline More, DO   Stopped at 11/11/18 2355  . allopurinol (ZYLOPRIM) tablet 100 mg  100 mg Oral Daily Sherin, Tammi Klippel, DO   100 mg at 11/13/18 1023  . aspirin chewable tablet 324 mg  324 mg Oral Daily Ronnette Juniper, MD   Stopped at 11/14/18 [phone]  . ceFAZolin (ANCEF) IVPB 1 g/50 mL premix  1 g Intravenous Q24H Lore, Melissa A, RPH   Stopped at 11/14/18 0020  . Chlorhexidine Gluconate Cloth 2 % PADS 6 each  6 each Topical Q0600 [name], MD   6 each at 11/14/18 0533  . citalopram (CELEXA) tablet 10 mg  10 mg Oral QHS Jonetta Osgood, MD   10 mg at 11/13/18 2147  . colchicine tablet 0.6 mg  0.6 mg Oral Daily Jonetta Osgood, MD   0.6 mg at 11/13/18 1519  . colesevelam [facility]) tablet 1,875 mg  1,875 mg Oral BID WC Ghimire, Henreitta Leber, MD      . ezetimibe (ZETIA) tablet 10 mg  10 mg Oral Daily Sherin, Abraham, DO   10 mg at 11/13/18 1021  . gabapentin (NEURONTIN) capsule 300 mg  300 mg Oral QHS Jonetta Osgood, MD   300 mg at 11/13/18 2148  . hydrocortisone sodium succinate (SOLU-CORTEF) 100 MG injection 50 mg  50 mg Intravenous Q8H Sherin, Abraham, DO   50 mg at 11/13/18 2340  . insulin aspart (novoLOG) injection 0-9 Units  0-9 Units Subcutaneous TID WC Caroline More, DO   1 Units at 11/12/18 [phone]  . methimazole (TAPAZOLE) tablet 2.5 mg  2.5 mg Oral Daily Sherin, Abraham, DO   2.5 mg at 11/13/18 1248  . midodrine (PROAMATINE) tablet 5 mg  5 mg Oral TID WC Sherin, Abraham, DO   5 mg at 11/13/18 1659  . vancomycin (VANCOCIN) 50 mg/mL oral solution 125 mg  125 mg Oral Q12H Caroline More, DO   125 mg at 11/13/18 2148    Allergies as of 11/11/2018 - Review Complete 11/11/2018  Allergen Reaction Noted  . Statins Other (See Comments) 06/03/2009  . Ciprofloxacin Rash 08/02/2015    Family History  Problem Relation Age of Onset  . Heart disease Sister   . Thyroid nodules Sister   .  Heart disease Father   . Diabetes Father   . Kidney failure Father   . Hypertension Father   . Healthy Child   .  Healthy Child   . Healthy Child   . Cancer Neg Hx     Social History   Socioeconomic History  . Marital status: Married    Spouse name: Not on file  . Number of children: 3  . Years of education: 65  . Highest education level: Not on file  Occupational History  . Occupation: disabled    Employer: DISABLED  . Occupation: formerly Lawyer for [employer].  Social Needs  . Financial resource strain: Not on file  . Food insecurity:    Worry: Not on file    Inability: Not on file  . Transportation needs:    Medical: Not on file    Non-medical: Not on file  Tobacco Use  . Smoking status: Former Smoker    Packs/day: 1.00    Years: 25.00    Pack years: 25.00    Types: Cigarettes    Last attempt to quit: 10/01/2005    Years since quitting: 13.1  . Smokeless tobacco: Former Systems developer    Quit date: 10/01/2005  . Tobacco comment: Quit smoking 2007 Smoked x 25 years 1/2 ppd.  Substance and Sexual Activity  . Alcohol use: No    Alcohol/week: 0.0 standard drinks  . Drug use: No  . Sexual activity: Never  Lifestyle  . Physical activity:    Days per week: 2 days    Minutes per session: 10 min  . Stress: Only a little  Relationships  . Social connections:    Talks on phone: Three times a week    Gets together: Once a week    Attends religious service: 1 to 4 times per year    Active member of club or organization: No    Attends meetings of clubs or organizations: Never    Relationship status: Married  . Intimate partner violence:    Fear of current or ex partner: Not on file    Emotionally abused: Not on file    Physically abused: Not on file    Forced sexual activity: Not on file  Other Topics Concern  . Not on file  Social History Narrative   Lives with wife   Caffeine use: Tea sometimes   No coffee   Left handed     Review of Systems: Positive  for: GI: Described in detail in HPI.    Gen: fatigue, weakness, malaise,fever,Denies any  chills, rigors, night sweats, anorexia,  involuntary weight loss and sleep disorder CV: Denies chest pain, angina, palpitations, syncope, orthopnea, PND, peripheral edema, and claudication. Resp: Denies dyspnea, cough, sputum, wheezing, coughing up blood. GU : Denies urinary burning, blood in urine, urinary frequency, urinary hesitancy, nocturnal urination, and urinary incontinence. MS: Denies joint pain or swelling.  Denies muscle weakness, cramps, atrophy.  Derm: Denies rash, itching, oral ulcerations, hives, unhealing ulcers.  Psych: Denies depression, anxiety, memory loss, suicidal ideation, hallucinations,  and confusion. Heme: Denies bruising, bleeding, and enlarged lymph nodes. Neuro:  Denies any headaches, dizziness, paresthesias. Endo: DM,  Hypothyroid, Denies any problems with adrenal function.  Physical Exam: Vital signs in last 24 hours: Temp:  [97.5 F (36.4 C)-97.7 F (36.5 C)] 97.7 F (36.5 C) (02/14 0509) Pulse Rate:  [66-70] 67 (02/14 0510) Resp:  [16-18] 18 (02/14 0509) BP: (134-166)/(64-86) 166/81 (02/14 0510) SpO2:  [96 %-99 %] 96 % (02/14 0509) Weight:  [104.3 kg-106.4 kg] 106.4 kg (02/14 0510) Last BM Date: 11/13/18  General:   Alert,  Well-developed, obese, pleasant and cooperative in NAD Head:  Normocephalic  and atraumatic. Eyes:  Sclera clear, no icterus.   Conjunctiva pink.Puffy eyes Ears:  Normal auditory acuity. Nose:  No deformity, discharge,  or lesions. Mouth:  No deformity or lesions.  Oropharynx pink & moist. Neck:  Supple; no masses or thyromegaly. Lungs:  Clear throughout to auscultation.   No wheezes, crackles, or rhonchi. No acute distress. Heart:  Regular rate and rhythm; no murmurs, clicks, rubs,  or gallops. Extremities: AV Fistula. Neurologic:  Alert and  oriented x4;  grossly normal neurologically. Skin:  Intact without significant lesions or  rashes. Psych:  Alert and cooperative. Normal mood and affect. Abdomen:  Soft, nontender and nondistended. No masses, hepatosplenomegaly or hernias noted. Normal bowel sounds, without guarding, and without rebound.         Lab Results: Recent Labs    11/12/18 0309 11/13/18 0357 11/14/18 0539  WBC 18.7* 12.5* 8.5  HGB 11.4* 10.6* 11.0*  HCT 37.4* 34.1* 35.6*  PLT 216 189 198   BMET Recent Labs    11/12/18 1557 11/13/18 0357 11/14/18 0539  NA 141 140 141  K 5.5* 5.4* 5.1  CL 106 105 107  CO2 21* 18* 19*  GLUCOSE 111* 110* 97  BUN 52* 56* 72*  CREATININE 10.77* 11.60* 13.15*  CALCIUM 8.3* 7.9* 7.9*   LFT Recent Labs    11/12/18 0309  11/14/18 0539  PROT 6.6  --   --   ALBUMIN 2.8*   < > 2.6*  AST 224*  --   --   ALT 230*  --   --   ALKPHOS 170*  --   --   BILITOT 2.4*  --   --   BILIDIR 1.6*  --   --   IBILI 0.8  --   --    < > = values in this interval not displayed.   PT/INR Recent Labs    11/14/18 0539  LABPROT 13.0  INR 0.99    Studies/Results: Mr Abdomen Mrcp Wo Contrast  Result Date: 11/13/2018 CLINICAL DATA:  Sepsis.  History of cholecystectomy. EXAM: MRI ABDOMEN WITHOUT CONTRAST  (INCLUDING MRCP) TECHNIQUE: Multiplanar multisequence MR imaging of the abdomen was performed. Heavily T2-weighted images of the biliary and pancreatic ducts were obtained, and three-dimensional MRCP images were rendered by post processing. COMPARISON:  CT scan 11/11/2018 FINDINGS: Lower chest: Bibasilar atelectasis. No pleural or pericardial effusions. Hepatobiliary: Intra and extrahepatic biliary dilatation is demonstrated. The common bile duct measures up to 14 mm. There is an 8 mm distal common bile duct stone along with a few other smaller stones layering dependently. Small calculi are also noted in the cystic duct remnant. There is also non dependent pneumobilia noted. Pancreas:  No mass, inflammation or ductal dilatation. Spleen:  Normal size.  No focal lesions.  Adrenals/Urinary Tract: The adrenal glands are unremarkable and stable. The kidneys are small and there are numerous cysts, typical findings related to dialysis. Stomach/Bowel: Visualized portions within the abdomen are unremarkable. Vascular/Lymphatic: No pathologically enlarged lymph nodes identified. No abdominal aortic aneurysm demonstrated. Other:  No ascites or abdominal wall hernia. Musculoskeletal: No significant bony findings. Spinal fusion hardware is noted. IMPRESSION: 1. 8 mm distal common bile duct stones along with a few other small common bile duct stones. I suspect there also small stones in the cystic duct remnant. There is also pneumobilia with some non dependent air bubbles in the biliary tree. 2. Unremarkable MR appearance of the pancreas. 3. Small/atrophied kidneys with numerous cysts. Electronically Signed   By: Mamie Nick.  Gallerani M.D.   On: 11/13/2018 20:53   Mr 3d Recon At Scanner  Result Date: 11/13/2018 CLINICAL DATA:  Sepsis.  History of cholecystectomy. EXAM: MRI ABDOMEN WITHOUT CONTRAST  (INCLUDING MRCP) TECHNIQUE: Multiplanar multisequence MR imaging of the abdomen was performed. Heavily T2-weighted images of the biliary and pancreatic ducts were obtained, and three-dimensional MRCP images were rendered by post processing. COMPARISON:  CT scan 11/11/2018 FINDINGS: Lower chest: Bibasilar atelectasis. No pleural or pericardial effusions. Hepatobiliary: Intra and extrahepatic biliary dilatation is demonstrated. The common bile duct measures up to 14 mm. There is an 8 mm distal common bile duct stone along with a few other smaller stones layering dependently. Small calculi are also noted in the cystic duct remnant. There is also non dependent pneumobilia noted. Pancreas:  No mass, inflammation or ductal dilatation. Spleen:  Normal size.  No focal lesions. Adrenals/Urinary Tract: The adrenal glands are unremarkable and stable. The kidneys are small and there are numerous cysts, typical  findings related to dialysis. Stomach/Bowel: Visualized portions within the abdomen are unremarkable. Vascular/Lymphatic: No pathologically enlarged lymph nodes identified. No abdominal aortic aneurysm demonstrated. Other:  No ascites or abdominal wall hernia. Musculoskeletal: No significant bony findings. Spinal fusion hardware is noted. IMPRESSION: 1. 8 mm distal common bile duct stones along with a few other small common bile duct stones. I suspect there also small stones in the cystic duct remnant. There is also pneumobilia with some non dependent air bubbles in the biliary tree. 2. Unremarkable MR appearance of the pancreas. 3. Small/atrophied kidneys with numerous cysts. Electronically Signed   By: Marijo Sanes M.D.   On: 11/13/2018 20:53    Impression: CBD stones, history of Klebsiella pneumonia bacteremia, fever and hypotension on presentation LFTs trending down, T bili 1 from 2.4, ALP 126 from 170, AST 31 from 224, AST 95 from 230,possible passed CBD stone  End-stage renal disease on hemodialysis, was hemodialysis session yesterday,spoke with Dr.Schertz from nephrology, doesn't appear volume overloaded and has normal potassium, he is ok with proceeding with ERCP prior to HD.  History of radiation proctitis treated with APC and RFA and esophageal stricture dilated in 2016  Multiple comorbidities-history of C. Difficile colitis, diabetes, hypothyroidism, obstructive sleep apnea on CPAP,history of prostate cancer status post radiation therapy, peripheral neuropathy, gout, CVA, depression  Plan: Proceed with an ERCP today. The patient was on aspirin 324 mg, last dose yesterday. Discussed about the risks(bleeding, infection, perforation, pancreatitis and even death) and benefits were discussed with the patient in details. He understands and verbalizes consent.   LOS: 3 days   Ronnette Juniper, M.D.  11/14/2018, 9:34 AM  Pager [phone] If no answer or after 5 PM call [phone]

## 2018-11-14 NOTE — Progress Notes (Addendum)
PROGRESS NOTE        PATIENT DETAILS Name: [name] Age: 68 y.o. Sex: male Date of Birth: 1951/08/05 Admit Date: 11/11/2018 Admitting Physician Jesus Genera, MD XAJ:OINO, Hunt Oris, MD  Brief Narrative: Patient is a 68 y.o. male ESRD, prior history of recurrent Klebsiella bacteremia, recent history of C. difficile colitis DM-2, hypothyroidism, OSA on CPAP presented to the hospital on 2/11 from HD for hypotension, fever,transient abdominal pain, thought to have septic shock with unknown foci-admitted to the ICU with broad-spectrum antimicrobial therapy, and vasopressor support.  Upon clinical stability, transferred to the triad hospitalist service on 2/13.  On further evaluation, patient was found to have intra-/extrahepatic ductal dilatation-underwent MRCP on 2/13 that confirmed choledocholithiasis (likely cause of recurrent bacteremia)-GI was subsequently consulted with plans for ERCP later today.  Subjective: Lying comfortably in bed-just came back from interventional radiology after a fistulogram.  Denies any abdominal pain or shortness of breath.  Assessment/Plan: Septic shock: Sepsis pathophysiology has resolved.  Blood pressure stable. Blood culture negative.  Stool C. difficile PCR negative. No PNA seen on x-ray of the chest. CT of the abdomen with intrahepatic and extrahepatic dilatation-MRCP on 2/13 showed choledocholithiasis.  Suspicion for transient cholangitis that could have caused sepsis pathophysiology.  Continue IV Ancef.  GI consulted-for ERCP later today.  Given recent history of C. difficile colitis-remains on oral vancomycin.  Continue to taper stress dose IV hydrocortisone.  Choledocholithiasis: Did have severe abdominal pain on admission-this has resolved-see above.  He is s/p cholecystectomy.  ESRD on HD TTS: Nephrology following and directing care.  AV malfunction on 2/13-underwent fistulogram with by IR on 2/14.  Transaminitis: Could be  from shock liver or passed gall stone-LFTs have markedly improved.  Continue to follow periodically.   History of CVA: No focal deficits-continue aspirin.  History of depression: Continue Celexa  History of peripheral neuropathy: Continue gabapentin  Hyperthyroidism: Continue Tapazole  Gout: No evidence of flare, continue allopurinol and colchicine.  OSA: CPAP nightly.   DVT Prophylaxis: Prophylactic Heparin   Code Status: Full code   Family Communication: None at bedside  Disposition Plan: Remain inpatient  Antimicrobial agents: Anti-infectives (From admission, onward)   Start     Dose/Rate Route Frequency Ordered Stop   11/14/18 0000  ceFAZolin (ANCEF) IVPB 1 g/50 mL premix     1 g 100 mL/hr over 30 Minutes Intravenous Every 24 hours 11/13/18 1430     11/13/18 1200  vancomycin (VANCOCIN) IVPB 1000 mg/200 mL premix  Status:  Discontinued     1,000 mg 200 mL/hr over 60 Minutes Intravenous Every T-Th-Sa (Hemodialysis) 11/11/18 1906 11/11/18 2143   11/13/18 1200  ceFEPIme (MAXIPIME) 2 g in sodium chloride 0.9 % 100 mL IVPB  Status:  Discontinued     2 g 200 mL/hr over 30 Minutes Intravenous Every T-Th-Sa (Hemodialysis) 11/11/18 1906 11/11/18 2103   11/11/18 2300  vancomycin (VANCOCIN) 50 mg/mL oral solution 125 mg     125 mg Oral Every 12 hours 11/11/18 2201     11/11/18 2300  cefTRIAXone (ROCEPHIN) 2 g in sodium chloride 0.9 % 100 mL IVPB  Status:  Discontinued     2 g 200 mL/hr over 30 Minutes Intravenous Every 24 hours 11/11/18 2202 11/13/18 1314   11/11/18 1600  ceFEPIme (MAXIPIME) 2 g in sodium chloride 0.9 % 100 mL IVPB  2 g 200 mL/hr over 30 Minutes Intravenous  Once 11/11/18 1548 11/11/18 1744   11/11/18 1600  metroNIDAZOLE (FLAGYL) IVPB 500 mg  Status:  Discontinued     500 mg 100 mL/hr over 60 Minutes Intravenous Every 8 hours 11/11/18 1548 11/11/18 2103   11/11/18 1600  vancomycin (VANCOCIN) IVPB 1000 mg/200 mL premix  Status:  Discontinued     1,000  mg 200 mL/hr over 60 Minutes Intravenous  Once 11/11/18 1548 11/11/18 1556   11/11/18 1600  vancomycin (VANCOCIN) 2,000 mg in sodium chloride 0.9 % 500 mL IVPB     2,000 mg 250 mL/hr over 120 Minutes Intravenous  Once 11/11/18 1556 11/11/18 2103      Procedures: 2/14>>Fistulagram of right UE brachiocephalic fistula. PTA of the proximal venous outflow extending from AV connection to cephalic vein aneurysm. PTA with 73mm and 19mm.  CONSULTS:  pulmonary/intensive care and ID  GI  Time spent: 25- minutes-Greater than 50% of this time was spent in counseling, explanation of diagnosis, planning of further management, and coordination of care.  MEDICATIONS: Scheduled Meds: . allopurinol  100 mg Oral Daily  . aspirin  324 mg Oral Daily  . Chlorhexidine Gluconate Cloth  6 each Topical Q0600  . citalopram  10 mg Oral QHS  . colchicine  0.6 mg Oral Daily  . colesevelam  1,875 mg Oral BID WC  . diphenhydrAMINE      . ezetimibe  10 mg Oral Daily  . fentaNYL      . gabapentin  300 mg Oral QHS  . hydrocortisone sod succinate (SOLU-CORTEF) inj  25 mg Intravenous Q12H  . insulin aspart  0-9 Units Subcutaneous TID WC  . lidocaine      . methimazole  2.5 mg Oral Daily  . midazolam      . midodrine  5 mg Oral TID WC  . vancomycin  125 mg Oral Q12H   Continuous Infusions: . sodium chloride Stopped (11/11/18 2355)  .  ceFAZolin (ANCEF) IV Stopped (11/14/18 0020)   PRN Meds:.   PHYSICAL EXAM: Vital signs: Vitals:   11/14/18 1115 11/14/18 1120 11/14/18 1125 11/14/18 1130  BP: (!) 177/95 (!) 176/93 (!) 170/89 (!) 169/94  Pulse: 67 68 66 66  Resp: 16 14 16 16   Temp:      TempSrc:      SpO2: 96% 99% 98% 97%  Weight:      Height:       Filed Weights   11/13/18 0720 11/14/18 0500 11/14/18 0510  Weight: 104.1 kg 104.3 kg 106.4 kg   Body mass index is 30.95 kg/m.   General appearance:Awake, alert, not in any distress.  Eyes:no scleral icterus. HEENT: Atraumatic and  Normocephalic Neck: supple, no JVD. Resp:Good air entry bilaterally,no rales or rhonchi CVS: S1 S2 regular GI: Bowel sounds present, Non tender and not distended with no gaurding, rigidity or rebound. Extremities: B/L Lower Ext shows no edema, both legs are warm to touch Neurology:  Non focal Psychiatric: Normal judgment and insight. Normal mood. Musculoskeletal:No digital cyanosis Skin:No Rash, warm and dry Wounds:N/A  I have personally reviewed following labs and imaging studies  LABORATORY DATA: CBC: Recent Labs  Lab 11/11/18 1701 11/12/18 0309 11/13/18 0357 11/14/18 0539  WBC 8.2 18.7* 12.5* 8.5  NEUTROABS 7.5  --   --   --   HGB 13.1 11.4* 10.6* 11.0*  HCT 43.0 37.4* 34.1* 35.6*  MCV 98.9 96.4 96.3 94.2  PLT 233 216 189 198    Basic  Metabolic Panel: Recent Labs  Lab 11/11/18 1701 11/12/18 0309 11/12/18 1557 11/13/18 0357 11/14/18 0539  NA 139 138 141 140 141  K 5.4* 6.2* 5.5* 5.4* 5.1  CL 101 105 106 105 107  CO2 22 24 21* 18* 19*  GLUCOSE 199* 212* 111* 110* 97  BUN 41* 43* 52* 56* 72*  CREATININE 10.04* 9.73* 10.77* 11.60* 13.15*  CALCIUM 8.2* 7.8* 8.3* 7.9* 7.9*  MG  --  1.9  --   --   --   PHOS  --  3.4  --  5.3* 7.1*    GFR: Estimated Creatinine Clearance: 6.8 mL/min (A) (by C-G formula based on SCr of 13.15 mg/dL (H)).  Liver Function Tests: Recent Labs  Lab 11/11/18 1701 11/12/18 0309 11/13/18 0357 11/14/18 0539  AST 332* 224*  --  31  ALT 211* 230*  --  95*  ALKPHOS 181* 170*  --  126  BILITOT 1.9* 2.4*  --  1.0  PROT 7.3 6.6  --  6.5  ALBUMIN 3.2* 2.8* 2.5* 2.5*  2.6*   No results for input(s): LIPASE, AMYLASE in the last 168 hours. No results for input(s): AMMONIA in the last 168 hours.  Coagulation Profile: Recent Labs  Lab 11/14/18 0539  INR 0.99    Cardiac Enzymes: No results for input(s): CKTOTAL, CKMB, CKMBINDEX, TROPONINI in the last 168 hours.  BNP (last 3 results) No results for input(s): PROBNP in the last 8760  hours.  HbA1C: No results for input(s): HGBA1C in the last 72 hours.  CBG: Recent Labs  Lab 11/13/18 2015 11/13/18 2109 11/14/18 0747 11/14/18 1210 11/14/18 1257  GLUCAP 98 93 74 63* 70    Lipid Profile: No results for input(s): CHOL, HDL, LDLCALC, TRIG, CHOLHDL, LDLDIRECT in the last 72 hours.  Thyroid Function Tests: Recent Labs    11/11/18 2308  TSH 0.978    Anemia Panel: No results for input(s): VITAMINB12, FOLATE, FERRITIN, TIBC, IRON, RETICCTPCT in the last 72 hours.  Urine analysis:    Component Value Date/Time   COLORURINE LT. YELLOW 06/26/2013 1200   APPEARANCEUR CLEAR 06/26/2013 1200   LABSPEC 1.020 06/26/2013 1200   PHURINE 8.5 06/26/2013 1200   GLUCOSEU 100 06/26/2013 1200   HGBUR MODERATE 06/26/2013 1200   BILIRUBINUR NEGATIVE 06/26/2013 1200   KETONESUR NEGATIVE 06/26/2013 1200   PROTEINUR >300 (A) 01/04/2010 1032   UROBILINOGEN 0.2 06/26/2013 1200   NITRITE NEGATIVE 06/26/2013 1200   LEUKOCYTESUR TRACE 06/26/2013 1200    Sepsis Labs: Lactic Acid, Venous    Component Value Date/Time   LATICACIDVEN 1.5 11/12/2018 0309    MICROBIOLOGY: Recent Results (from the past 240 hour(s))  MRSA PCR Screening     Status: None   Collection Time: 11/11/18 10:12 PM  Result Value Ref Range Status   MRSA by PCR NEGATIVE NEGATIVE Final    Comment:        The GeneXpert MRSA Assay (FDA approved for NASAL specimens only), is one component of a comprehensive MRSA colonization surveillance program. It is not intended to diagnose MRSA infection nor to guide or monitor treatment for MRSA infections. Performed at [facility], [place] [address] [place] [address] [place], [place] [address]   Blood Culture (routine x 2)     Status: None (Preliminary result)   Collection Time: 11/11/18 10:50 PM  Result Value Ref Range Status   Specimen Description BLOOD LEFT HAND  Final   Special Requests   Final    BOTTLES DRAWN AEROBIC ONLY Blood Culture results  may not be optimal due  to an inadequate volume of blood received in culture bottles   Culture   Final    NO GROWTH 2 DAYS Performed at [facility], [place] [address], [place], [place] [address]    Report Status PENDING  Incomplete  Blood Culture (routine x 2)     Status: None (Preliminary result)   Collection Time: 11/11/18 11:10 PM  Result Value Ref Range Status   Specimen Description BLOOD LEFT WRIST  Final   Special Requests   Final    BOTTLES DRAWN AEROBIC ONLY Blood Culture results may not be optimal due to an inadequate volume of blood received in culture bottles   Culture   Final    NO GROWTH 2 DAYS Performed at [facility], [place] [address], [place], [place] [address]    Report Status PENDING  Incomplete  C difficile quick scan w PCR reflex     Status: None   Collection Time: 11/12/18  4:51 PM  Result Value Ref Range Status   C Diff antigen NEGATIVE NEGATIVE Final   C Diff toxin NEGATIVE NEGATIVE Final   C Diff interpretation No C. difficile detected.  Final    Comment: Performed at [facility], [place] [address] [place], [place] [address]    RADIOLOGY STUDIES/RESULTS: Ct Abdomen Pelvis W Contrast  Result Date: 11/11/2018 CLINICAL DATA:  Generalized abdomen pain with fever EXAM: CT ABDOMEN AND PELVIS WITH CONTRAST TECHNIQUE: Multidetector CT imaging of the abdomen and pelvis was performed using the standard protocol following bolus administration of intravenous contrast. CONTRAST:  151mL OMNIPAQUE IOHEXOL 300 MG/ML  SOLN COMPARISON:  October 04, 2018 FINDINGS: Lower chest: The heart size is enlarged. Mild atelectasis of posterior lung bases are noted. Right lung base nodule 4 mm unchanged. Hepatobiliary: Mild diffuse low density of liver is identified. Patient status post prior cholecystectomy with intrahepatic biliary ductal dilatation and extrahepatic biliary ductal dilatation identified. Pneumobilia is noted. Pancreas: Unremarkable. No pancreatic ductal dilatation or surrounding  inflammatory changes. Spleen: Normal in size without focal abnormality. Adrenals/Urinary Tract: The adrenal glands are normal. Bilateral kidney cysts are identified in atrophic kidneys bilaterally. Renal vascular calcifications are identified bilaterally. There is no hydronephrosis bilaterally. The bladder is decompressed limiting evaluation. Stomach/Bowel: Stomach is within normal limits. Appendix appears normal. No evidence of bowel wall thickening, distention, or inflammatory changes. Vascular/Lymphatic: Aortic atherosclerosis. No enlarged abdominal or pelvic lymph nodes. Reproductive: Surgical clips are identified in the prostate gland unchanged. Other: None. Musculoskeletal: Degenerative joint changes of the spine are noted. Patient status post prior posterior fusion of lower lumbar spine. IMPRESSION: No acute abnormality identified in the abdomen and pelvis. Fatty infiltration of liver. Status post prior cholecystectomy with intra and extrahepatic biliary ductal dilatation, postsurgical. Atrophic bilateral kidneys. Electronically Signed   By: Abelardo Diesel M.D.   On: 11/11/2018 19:58   Mr Abdomen Mrcp Wo Contrast  Result Date: 11/13/2018 CLINICAL DATA:  Sepsis.  History of cholecystectomy. EXAM: MRI ABDOMEN WITHOUT CONTRAST  (INCLUDING MRCP) TECHNIQUE: Multiplanar multisequence MR imaging of the abdomen was performed. Heavily T2-weighted images of the biliary and pancreatic ducts were obtained, and three-dimensional MRCP images were rendered by post processing. COMPARISON:  CT scan 11/11/2018 FINDINGS: Lower chest: Bibasilar atelectasis. No pleural or pericardial effusions. Hepatobiliary: Intra and extrahepatic biliary dilatation is demonstrated. The common bile duct measures up to 14 mm. There is an 8 mm distal common bile duct stone along with a few other smaller stones layering  dependently. Small calculi are also noted in the cystic duct remnant. There is also non dependent pneumobilia noted. Pancreas:   No mass, inflammation or ductal dilatation. Spleen:  Normal size.  No focal lesions. Adrenals/Urinary Tract: The adrenal glands are unremarkable and stable. The kidneys are small and there are numerous cysts, typical findings related to dialysis. Stomach/Bowel: Visualized portions within the abdomen are unremarkable. Vascular/Lymphatic: No pathologically enlarged lymph nodes identified. No abdominal aortic aneurysm demonstrated. Other:  No ascites or abdominal wall hernia. Musculoskeletal: No significant bony findings. Spinal fusion hardware is noted. IMPRESSION: 1. 8 mm distal common bile duct stones along with a few other small common bile duct stones. I suspect there also small stones in the cystic duct remnant. There is also pneumobilia with some non dependent air bubbles in the biliary tree. 2. Unremarkable MR appearance of the pancreas. 3. Small/atrophied kidneys with numerous cysts. Electronically Signed   By: Marijo Sanes M.D.   On: 11/13/2018 20:53   Mr 3d Recon At Scanner  Result Date: 11/13/2018 CLINICAL DATA:  Sepsis.  History of cholecystectomy. EXAM: MRI ABDOMEN WITHOUT CONTRAST  (INCLUDING MRCP) TECHNIQUE: Multiplanar multisequence MR imaging of the abdomen was performed. Heavily T2-weighted images of the biliary and pancreatic ducts were obtained, and three-dimensional MRCP images were rendered by post processing. COMPARISON:  CT scan 11/11/2018 FINDINGS: Lower chest: Bibasilar atelectasis. No pleural or pericardial effusions. Hepatobiliary: Intra and extrahepatic biliary dilatation is demonstrated. The common bile duct measures up to 14 mm. There is an 8 mm distal common bile duct stone along with a few other smaller stones layering dependently. Small calculi are also noted in the cystic duct remnant. There is also non dependent pneumobilia noted. Pancreas:  No mass, inflammation or ductal dilatation. Spleen:  Normal size.  No focal lesions. Adrenals/Urinary Tract: The adrenal glands are  unremarkable and stable. The kidneys are small and there are numerous cysts, typical findings related to dialysis. Stomach/Bowel: Visualized portions within the abdomen are unremarkable. Vascular/Lymphatic: No pathologically enlarged lymph nodes identified. No abdominal aortic aneurysm demonstrated. Other:  No ascites or abdominal wall hernia. Musculoskeletal: No significant bony findings. Spinal fusion hardware is noted. IMPRESSION: 1. 8 mm distal common bile duct stones along with a few other small common bile duct stones. I suspect there also small stones in the cystic duct remnant. There is also pneumobilia with some non dependent air bubbles in the biliary tree. 2. Unremarkable MR appearance of the pancreas. 3. Small/atrophied kidneys with numerous cysts. Electronically Signed   By: Marijo Sanes M.D.   On: 11/13/2018 20:53   Ir US Guide Vasc Access Right  Result Date: 11/14/2018 INDICATION: 68 year old male with a history of poor flow of right upper extremity fistula during dialysis EXAM: ULTRASOUND-GUIDED ACCESS RIGHT UPPER EXTREMITY FISTULA ANGIOPLASTY VENOUS OUTFLOW STENOSIS MEDICATIONS: 1.5 mg Versed, 75 mcg fentanyl ANESTHESIA/SEDATION: Moderate Sedation Time:  29 minutes The patient was continuously monitored during the procedure by the interventional radiology nurse under my direct supervision. FLUOROSCOPY TIME:  Fluoroscopy Time: 3 minutes 54 seconds (32 mGy). COMPLICATIONS: None PROCEDURE: Informed written consent was obtained from the patient after a thorough discussion of the procedural risks, benefits and alternatives. All questions were addressed. Maximal Sterile Barrier Technique was utilized including caps, mask, sterile gowns, sterile gloves, sterile drape, hand hygiene and skin antiseptic. A timeout was performed prior to the initiation of the procedure. Patient's right arm was prepped and draped in the usual sterile fashion, 90 degree abduction. Ultrasound images were stored sent to  PACs.  1% lidocaine was used for local anesthesia. Ultrasound guidance was used to puncture the fistula directed centrally. With the micro puncture in place, images were performed of the upper extremity including reflux images to the arteriovenous connection. Given that the narrowing was identified just beyond the AV connection, the access was reversed towards the AV connection. This was done using fluoroscopy and Glidewire. Six French sheath was then placed over the Glidewire. Balloon angioplasty was then performed with 4 mm x 40 mm balloon and then 5 mm x 40 mm balloon. Less than 30% residual was identified at the completion. Repeat images were performed. Excellent thrill was confirmed. All wires catheters balloons and the sheath were removed placing a stay suture. Patient tolerated the procedure well and remained hemodynamically stable throughout. No complications were encountered and no significant blood loss. IMPRESSION: Status post ultrasound-guided access of the right upper extremity fistula for fistulagram and balloon angioplasty of proximal venous outflow stenosis, with less than 30% residual at the completion. Excellent thrill confirmed upon completion. Signed, Dulcy Fanny. Dellia Nims, RPVI [facility] Radiology ACCESS: This access remains amenable to future percutaneous interventions as clinically indicated. Electronically Signed   By: Corrie Mckusick D.O.   On: 11/14/2018 12:01   Dg Chest Port 1 View  Result Date: 11/11/2018 CLINICAL DATA:  Fever and altered mental status EXAM: PORTABLE CHEST 1 VIEW COMPARISON:  October 03, 2018 FINDINGS: The heart size and mediastinal contours are stable. The aorta is tortuous. There is mild central pulmonary vascular congestion. There is no frank pulmonary edema, focal pneumonia or pleural effusion. The visualized skeletal structures are stable. IMPRESSION: Mild central pulmonary vascular congestion. Electronically Signed   By:  Abelardo Diesel M.D.   On: 11/11/2018 16:08   Ir Av Dialy Shunt Intro Needle/intracath Initial W/pta/img Right  Result Date: 11/14/2018 INDICATION: 68 year old male with a history of poor flow of right upper extremity fistula during dialysis EXAM: ULTRASOUND-GUIDED ACCESS RIGHT UPPER EXTREMITY FISTULA ANGIOPLASTY VENOUS OUTFLOW STENOSIS MEDICATIONS: 1.5 mg Versed, 75 mcg fentanyl ANESTHESIA/SEDATION: Moderate Sedation Time:  29 minutes The patient was continuously monitored during the procedure by the interventional radiology nurse under my direct supervision. FLUOROSCOPY TIME:  Fluoroscopy Time: 3 minutes 54 seconds (32 mGy). COMPLICATIONS: None PROCEDURE: Informed written consent was obtained from the patient after a thorough discussion of the procedural risks, benefits and alternatives. All questions were addressed. Maximal Sterile Barrier Technique was utilized including caps, mask, sterile gowns, sterile gloves, sterile drape, hand hygiene and skin antiseptic. A timeout was performed prior to the initiation of the procedure. Patient's right arm was prepped and draped in the usual sterile fashion, 90 degree abduction. Ultrasound images were stored sent to PACs. 1% lidocaine was used for local anesthesia. Ultrasound guidance was used to puncture the fistula directed centrally. With the micro puncture in place, images were performed of the upper extremity including reflux images to the arteriovenous connection. Given that the narrowing was identified just beyond the AV connection, the access was reversed towards the AV connection. This was done using fluoroscopy and Glidewire. Six French sheath was then placed over the Glidewire. Balloon angioplasty was then performed with 4 mm x 40 mm balloon and then 5 mm x 40 mm balloon. Less than 30% residual was identified at the completion. Repeat images were performed. Excellent thrill was confirmed. All wires catheters balloons and the sheath were removed placing a stay  suture. Patient tolerated the procedure well and remained hemodynamically stable throughout. No  complications were encountered and no significant blood loss. IMPRESSION: Status post ultrasound-guided access of the right upper extremity fistula for fistulagram and balloon angioplasty of proximal venous outflow stenosis, with less than 30% residual at the completion. Excellent thrill confirmed upon completion. Signed, Dulcy Fanny. Dellia Nims, RPVI [facility] Radiology ACCESS: This access remains amenable to future percutaneous interventions as clinically indicated. Electronically Signed   By: Corrie Mckusick D.O.   On: 11/14/2018 12:01     LOS: 3 days   Oren Binet, MD  [facility]  If 7PM-7AM, please contact night-coverage  Please page via [email or web address]  Go to [email or web address] and use [facility] universal password to access. If you do not have the password, please contact the hospital operator.  Locate the [facility] provider you are looking for under [facility] and page to a number that you can be directly reached. If you still have difficulty reaching the provider, please page the [facility] (Director on Call) for the Hospitalists listed on amion for assistance.  11/14/2018, 1:10 PM

## 2018-11-14 NOTE — Interval H&P Note (Signed)
History and Physical Interval Note: 67/male with CBD stones for an ERCP.  11/14/2018 1:58 PM  [name]  has presented today for ERCP, with the diagnosis of CBD stones  The various methods of treatment have been discussed with the patient and family. After consideration of risks, benefits and other options for treatment, the patient has consented to  Procedure(s): ENDOSCOPIC RETROGRADE CHOLANGIOPANCREATOGRAPHY (ERCP) (N/A) as a surgical intervention .  The patient's history has been reviewed, patient examined, no change in status, stable for surgery.  I have reviewed the patient's chart and labs.  Questions were answered to the patient's satisfaction.     Ronnette Juniper

## 2018-11-14 NOTE — Anesthesia Preprocedure Evaluation (Addendum)
Anesthesia Evaluation  Patient identified by MRN, date of birth, ID band Patient awake    Reviewed: Allergy & Precautions, NPO status , Patient's Chart, lab work & pertinent test results  Airway Mallampati: IV  TM Distance: >3 FB Neck ROM: Full    Dental no notable dental hx.    Pulmonary sleep apnea and Continuous Positive Airway Pressure Ventilation , former smoker,    Pulmonary exam normal breath sounds clear to auscultation       Cardiovascular hypertension, + CAD, + Past MI, + Cardiac Stents, + Peripheral Vascular Disease and +CHF  Normal cardiovascular exam Rhythm:Regular Rate:Normal  ECG: NSR, rate 75  ECHO: LV EF: 45% -   50%  Sees cardiologist   Neuro/Psych PSYCHIATRIC DISORDERS Depression Dementia CVA, No Residual Symptoms    GI/Hepatic PUD, GERD  Medicated and Controlled,(+) Hepatitis -  Endo/Other  diabetesHyperthyroidism   Renal/GU ESRF and DialysisRenal diseaseOn HD T, R, Sat     Musculoskeletal Gout   Abdominal (+) + obese,   Peds  Hematology  (+) anemia , HLD   Anesthesia Other Findings CBD stones  Reproductive/Obstetrics                            Anesthesia Physical Anesthesia Plan  ASA: IV  Anesthesia Plan: General   Post-op Pain Management:    Induction: Intravenous  PONV Risk Score and Plan: 2 and Ondansetron, Dexamethasone and Treatment may vary due to age or medical condition  Airway Management Planned: Oral ETT  Additional Equipment:   Intra-op Plan:   Post-operative Plan: Extubation in OR  Informed Consent: I have reviewed the patients History and Physical, chart, labs and discussed the procedure including the risks, benefits and alternatives for the proposed anesthesia with the patient or authorized representative who has indicated his/her understanding and acceptance.     Dental advisory given  Plan Discussed with: CRNA  Anesthesia Plan  Comments:        Anesthesia Quick Evaluation

## 2018-11-14 NOTE — Brief Op Note (Signed)
11/11/2018 - 11/14/2018  3:34 PM  PATIENT:  [name]  68 y.o. male  PRE-OPERATIVE DIAGNOSIS:  CBD stones  POST-OPERATIVE DIAGNOSIS:  CBD stones removed  PROCEDURE:  Procedure(s): ENDOSCOPIC RETROGRADE CHOLANGIOPANCREATOGRAPHY (ERCP) (N/A) BALLOON DILATION (N/A)  SURGEON:  Surgeon(s) and Role:    Ronnette Juniper, MD - Primary  PHYSICIAN ASSISTANT:   ASSISTANTS: Vista Lawman, RN, Charolette Child, Tech  ANESTHESIA:   MAC  EBL:  10 mL   BLOOD ADMINISTERED:none  DRAINS: none   LOCAL MEDICATIONS USED:  NONE  SPECIMEN:  None  DISPOSITION OF SPECIMEN:  N/A  COUNTS:  YES  TOURNIQUET:  * No tourniquets in log *  DICTATION: .Dragon Dictation  PLAN OF CARE: Admit to inpatient   PATIENT DISPOSITION:  PACU - hemodynamically stable.   Delay start of Pharmacological VTE agent (>24hrs) due to surgical blood loss or risk of bleeding: no

## 2018-11-14 NOTE — Sedation Documentation (Signed)
Patient stated started to itch everywhere. Airway intact denies shortness of breath. Doctor ordered benadryl 25 mg IVP

## 2018-11-14 NOTE — Sedation Documentation (Signed)
Patient states itching has improved and denies shortness of breath airway intact.

## 2018-11-14 NOTE — Sedation Documentation (Signed)
IV team at bedside 

## 2018-11-15 DIAGNOSIS — Z87442 Personal history of urinary calculi: Secondary | ICD-10-CM

## 2018-11-15 DIAGNOSIS — B961 Klebsiella pneumoniae [K. pneumoniae] as the cause of diseases classified elsewhere: Secondary | ICD-10-CM

## 2018-11-15 DIAGNOSIS — R7881 Bacteremia: Secondary | ICD-10-CM

## 2018-11-15 DIAGNOSIS — K8050 Calculus of bile duct without cholangitis or cholecystitis without obstruction: Secondary | ICD-10-CM

## 2018-11-15 DIAGNOSIS — T82590D Other mechanical complication of surgically created arteriovenous fistula, subsequent encounter: Secondary | ICD-10-CM

## 2018-11-15 LAB — RENAL FUNCTION PANEL
ANION GAP: 15 (ref 5–15)
Albumin: 2.6 g/dL — ABNORMAL LOW (ref 3.5–5.0)
BUN: 42 mg/dL — ABNORMAL HIGH (ref 8–23)
CHLORIDE: 101 mmol/L (ref 98–111)
CO2: 23 mmol/L (ref 22–32)
Calcium: 7.7 mg/dL — ABNORMAL LOW (ref 8.9–10.3)
Creatinine, Ser: 9.29 mg/dL — ABNORMAL HIGH (ref 0.61–1.24)
GFR calc Af Amer: 6 mL/min — ABNORMAL LOW (ref >60)
GFR calc non Af Amer: 5 mL/min — ABNORMAL LOW (ref >60)
Glucose, Bld: 134 mg/dL — ABNORMAL HIGH (ref 70–99)
Phosphorus: 6.8 mg/dL — ABNORMAL HIGH (ref 2.5–4.6)
Potassium: 4.3 mmol/L (ref 3.5–5.1)
Sodium: 139 mmol/L (ref 135–145)

## 2018-11-15 LAB — HEPATIC FUNCTION PANEL
ALT: 63 U/L — AB (ref 0–44)
AST: 24 U/L (ref 15–41)
Albumin: 2.6 g/dL — ABNORMAL LOW (ref 3.5–5.0)
Alkaline Phosphatase: 120 U/L (ref 38–126)
BILIRUBIN INDIRECT: 0.9 mg/dL (ref 0.3–0.9)
Bilirubin, Direct: 0.2 mg/dL (ref 0.0–0.2)
Total Bilirubin: 1.1 mg/dL (ref 0.3–1.2)
Total Protein: 6.3 g/dL — ABNORMAL LOW (ref 6.5–8.1)

## 2018-11-15 LAB — HEPATITIS B SURFACE ANTIGEN: Hepatitis B Surface Ag: NEGATIVE

## 2018-11-15 LAB — GLUCOSE, CAPILLARY
Glucose-Capillary: 101 mg/dL — ABNORMAL HIGH (ref 70–99)
Glucose-Capillary: 148 mg/dL — ABNORMAL HIGH (ref 70–99)
Glucose-Capillary: 122 mg/dL — ABNORMAL HIGH (ref 70–99)
Glucose-Capillary: 133 mg/dL — ABNORMAL HIGH (ref 70–99)

## 2018-11-15 LAB — CBC
HEMATOCRIT: 33.8 % — AB (ref 39.0–52.0)
Hemoglobin: 11 g/dL — ABNORMAL LOW (ref 13.0–17.0)
MCH: 30.1 pg (ref 26.0–34.0)
MCHC: 32.5 g/dL (ref 30.0–36.0)
MCV: 92.6 fL (ref 80.0–100.0)
Platelets: 198 10*3/uL (ref 150–400)
RBC: 3.65 MIL/uL — ABNORMAL LOW (ref 4.22–5.81)
RDW: 18.6 % — ABNORMAL HIGH (ref 11.5–15.5)
WBC: 6.1 10*3/uL (ref 4.0–10.5)
nRBC: 0 % (ref 0.0–0.2)

## 2018-11-15 MED ORDER — HYDROCORTISONE NA SUCCINATE PF 100 MG IJ SOLR
25.0000 mg | Freq: Every day | INTRAMUSCULAR | Status: DC
  Administered 2018-11-16: 25 mg via INTRAVENOUS
  Filled 2018-11-15: qty 2

## 2018-11-15 MED ORDER — CEFAZOLIN SODIUM-DEXTROSE 1-4 GM/50ML-% IV SOLN
1.0000 g | INTRAVENOUS | Status: DC
  Administered 2018-11-15: 1 g via INTRAVENOUS
  Filled 2018-11-15: qty 50

## 2018-11-15 NOTE — Progress Notes (Signed)
[facility] PROGRESS NOTE Subjective Patient had ERCP with stone extraction yesterday.  Pancreatic duct was not cannulated.  Stones and sludge removed.  Patient is eating breakfast with no pain.  He had many questions.  He states that he had C. difficile and wants to know if the stones in his bile duct were causing C. difficile.  Multiple other questions about stones in the bile duct affecting his renal failure etc.  Objective: Vital signs in last 24 hours: Temp:  [97.4 F (36.3 C)-98.4 F (36.9 C)] 98.4 F (36.9 C) (02/15 0439) Pulse Rate:  [54-72] 72 (02/15 0439) Resp:  [9-18] 18 (02/15 0439) BP: (133-183)/(66-97) 133/73 (02/15 0439) SpO2:  [94 %-100 %] 97 % (02/15 0439) Weight:  [102 kg-104.7 kg] 102 kg (02/14 2030) Last BM Date: 11/14/18  Intake/Output from previous day: 02/14 0701 - 02/15 0700 In: 240 [P.O.:240] Out: 2510 [Blood:10] Intake/Output this shift: No intake/output data recorded.  PE: General--eating breakfast no obvious distress - Abdomen--soft completely nontender  Lab Results: Recent Labs    11/13/18 0357 11/14/18 0539 11/15/18 0345  WBC 12.5* 8.5 6.1  HGB 10.6* 11.0* 11.0*  HCT 34.1* 35.6* 33.8*  PLT 189 198 198   BMET Recent Labs    11/12/18 1557 11/13/18 0357 11/14/18 0539 11/15/18 0345  NA 141 140 141 139  K 5.5* 5.4* 5.1 4.3  CL 106 105 107 101  CO2 21* 18* 19* 23  CREATININE 10.77* 11.60* 13.15* 9.29*   LFT Recent Labs    11/14/18 0539 11/15/18 0345  PROT 6.5 6.3*  AST 31 24  ALT 95* 63*  ALKPHOS 126 120  BILITOT 1.0 1.1  BILIDIR 0.2 0.2  IBILI 0.8 0.9   PT/INR Recent Labs    11/14/18 0539  LABPROT 13.0  INR 0.99   PANCREAS No results for input(s): LIPASE in the last 72 hours.       Studies/Results: Mr Abdomen Mrcp Wo Contrast  Result Date: 11/13/2018 CLINICAL DATA:  Sepsis.  History of cholecystectomy. EXAM: MRI ABDOMEN WITHOUT CONTRAST  (INCLUDING MRCP) TECHNIQUE: Multiplanar multisequence MR  imaging of the abdomen was performed. Heavily T2-weighted images of the biliary and pancreatic ducts were obtained, and three-dimensional MRCP images were rendered by post processing. COMPARISON:  CT scan 11/11/2018 FINDINGS: Lower chest: Bibasilar atelectasis. No pleural or pericardial effusions. Hepatobiliary: Intra and extrahepatic biliary dilatation is demonstrated. The common bile duct measures up to 14 mm. There is an 8 mm distal common bile duct stone along with a few other smaller stones layering dependently. Small calculi are also noted in the cystic duct remnant. There is also non dependent pneumobilia noted. Pancreas:  No mass, inflammation or ductal dilatation. Spleen:  Normal size.  No focal lesions. Adrenals/Urinary Tract: The adrenal glands are unremarkable and stable. The kidneys are small and there are numerous cysts, typical findings related to dialysis. Stomach/Bowel: Visualized portions within the abdomen are unremarkable. Vascular/Lymphatic: No pathologically enlarged lymph nodes identified. No abdominal aortic aneurysm demonstrated. Other:  No ascites or abdominal wall hernia. Musculoskeletal: No significant bony findings. Spinal fusion hardware is noted. IMPRESSION: 1. 8 mm distal common bile duct stones along with a few other small common bile duct stones. I suspect there also small stones in the cystic duct remnant. There is also pneumobilia with some non dependent air bubbles in the biliary tree. 2. Unremarkable MR appearance of the pancreas. 3. Small/atrophied kidneys with numerous cysts. Electronically Signed   By: Marijo Sanes M.D.   On: 11/13/2018 20:53  Mr 3d Recon At Scanner  Result Date: 11/13/2018 CLINICAL DATA:  Sepsis.  History of cholecystectomy. EXAM: MRI ABDOMEN WITHOUT CONTRAST  (INCLUDING MRCP) TECHNIQUE: Multiplanar multisequence MR imaging of the abdomen was performed. Heavily T2-weighted images of the biliary and pancreatic ducts were obtained, and three-dimensional  MRCP images were rendered by post processing. COMPARISON:  CT scan 11/11/2018 FINDINGS: Lower chest: Bibasilar atelectasis. No pleural or pericardial effusions. Hepatobiliary: Intra and extrahepatic biliary dilatation is demonstrated. The common bile duct measures up to 14 mm. There is an 8 mm distal common bile duct stone along with a few other smaller stones layering dependently. Small calculi are also noted in the cystic duct remnant. There is also non dependent pneumobilia noted. Pancreas:  No mass, inflammation or ductal dilatation. Spleen:  Normal size.  No focal lesions. Adrenals/Urinary Tract: The adrenal glands are unremarkable and stable. The kidneys are small and there are numerous cysts, typical findings related to dialysis. Stomach/Bowel: Visualized portions within the abdomen are unremarkable. Vascular/Lymphatic: No pathologically enlarged lymph nodes identified. No abdominal aortic aneurysm demonstrated. Other:  No ascites or abdominal wall hernia. Musculoskeletal: No significant bony findings. Spinal fusion hardware is noted. IMPRESSION: 1. 8 mm distal common bile duct stones along with a few other small common bile duct stones. I suspect there also small stones in the cystic duct remnant. There is also pneumobilia with some non dependent air bubbles in the biliary tree. 2. Unremarkable MR appearance of the pancreas. 3. Small/atrophied kidneys with numerous cysts. Electronically Signed   By: Marijo Sanes M.D.   On: 11/13/2018 20:53   Ir US Guide Vasc Access Right  Result Date: 11/14/2018 INDICATION: 68 year old male with a history of poor flow of right upper extremity fistula during dialysis EXAM: ULTRASOUND-GUIDED ACCESS RIGHT UPPER EXTREMITY FISTULA ANGIOPLASTY VENOUS OUTFLOW STENOSIS MEDICATIONS: 1.5 mg Versed, 75 mcg fentanyl ANESTHESIA/SEDATION: Moderate Sedation Time:  29 minutes The patient was continuously monitored during the procedure by the interventional radiology nurse under my  direct supervision. FLUOROSCOPY TIME:  Fluoroscopy Time: 3 minutes 54 seconds (32 mGy). COMPLICATIONS: None PROCEDURE: Informed written consent was obtained from the patient after a thorough discussion of the procedural risks, benefits and alternatives. All questions were addressed. Maximal Sterile Barrier Technique was utilized including caps, mask, sterile gowns, sterile gloves, sterile drape, hand hygiene and skin antiseptic. A timeout was performed prior to the initiation of the procedure. Patient's right arm was prepped and draped in the usual sterile fashion, 90 degree abduction. Ultrasound images were stored sent to PACs. 1% lidocaine was used for local anesthesia. Ultrasound guidance was used to puncture the fistula directed centrally. With the micro puncture in place, images were performed of the upper extremity including reflux images to the arteriovenous connection. Given that the narrowing was identified just beyond the AV connection, the access was reversed towards the AV connection. This was done using fluoroscopy and Glidewire. Six French sheath was then placed over the Glidewire. Balloon angioplasty was then performed with 4 mm x 40 mm balloon and then 5 mm x 40 mm balloon. Less than 30% residual was identified at the completion. Repeat images were performed. Excellent thrill was confirmed. All wires catheters balloons and the sheath were removed placing a stay suture. Patient tolerated the procedure well and remained hemodynamically stable throughout. No complications were encountered and no significant blood loss. IMPRESSION: Status post ultrasound-guided access of the right upper extremity fistula for fistulagram and balloon angioplasty of proximal venous outflow stenosis, with less than 30% residual  at the completion. Excellent thrill confirmed upon completion. Signed, Dulcy Fanny. Dellia Nims, RPVI [facility] Radiology ACCESS: This access remains  amenable to future percutaneous interventions as clinically indicated. Electronically Signed   By: Corrie Mckusick D.O.   On: 11/14/2018 12:01   Dg Ercp  Result Date: 11/14/2018 CLINICAL DATA:  68 year old male with choledocholithiasis EXAM: ERCP TECHNIQUE: Multiple spot images obtained with the fluoroscopic device and submitted for interpretation post-procedure. FLUOROSCOPY TIME:  Fluoroscopy Time:  5 minutes 28 seconds reported COMPARISON:  MRCP 11/13/2018 FINDINGS: A total of 4 intraoperative spot images are submitted for review. The images demonstrate a flexible endoscope in the descending duodenum with wire cannulation of the common bile duct. Subsequent cholangiography demonstrates intra and extrahepatic biliary ductal dilatation. Subsequent images show sphincterotomy and balloon sweep of the common duct. IMPRESSION: ERCP as above. These images were submitted for radiologic interpretation only. Please see the procedural report for the amount of contrast and the fluoroscopy time utilized. Electronically Signed   By: Jacqulynn Cadet M.D.   On: 11/14/2018 15:46   Ir Av Dialy Shunt Intro Needle/intracath Initial W/pta/img Right  Result Date: 11/14/2018 INDICATION: 68 year old male with a history of poor flow of right upper extremity fistula during dialysis EXAM: ULTRASOUND-GUIDED ACCESS RIGHT UPPER EXTREMITY FISTULA ANGIOPLASTY VENOUS OUTFLOW STENOSIS MEDICATIONS: 1.5 mg Versed, 75 mcg fentanyl ANESTHESIA/SEDATION: Moderate Sedation Time:  29 minutes The patient was continuously monitored during the procedure by the interventional radiology nurse under my direct supervision. FLUOROSCOPY TIME:  Fluoroscopy Time: 3 minutes 54 seconds (32 mGy). COMPLICATIONS: None PROCEDURE: Informed written consent was obtained from the patient after a thorough discussion of the procedural risks, benefits and alternatives. All questions were addressed. Maximal Sterile Barrier Technique was utilized including caps, mask,  sterile gowns, sterile gloves, sterile drape, hand hygiene and skin antiseptic. A timeout was performed prior to the initiation of the procedure. Patient's right arm was prepped and draped in the usual sterile fashion, 90 degree abduction. Ultrasound images were stored sent to PACs. 1% lidocaine was used for local anesthesia. Ultrasound guidance was used to puncture the fistula directed centrally. With the micro puncture in place, images were performed of the upper extremity including reflux images to the arteriovenous connection. Given that the narrowing was identified just beyond the AV connection, the access was reversed towards the AV connection. This was done using fluoroscopy and Glidewire. Six French sheath was then placed over the Glidewire. Balloon angioplasty was then performed with 4 mm x 40 mm balloon and then 5 mm x 40 mm balloon. Less than 30% residual was identified at the completion. Repeat images were performed. Excellent thrill was confirmed. All wires catheters balloons and the sheath were removed placing a stay suture. Patient tolerated the procedure well and remained hemodynamically stable throughout. No complications were encountered and no significant blood loss. IMPRESSION: Status post ultrasound-guided access of the right upper extremity fistula for fistulagram and balloon angioplasty of proximal venous outflow stenosis, with less than 30% residual at the completion. Excellent thrill confirmed upon completion. Signed, Dulcy Fanny. Dellia Nims, RPVI [facility] Radiology ACCESS: This access remains amenable to future percutaneous interventions as clinically indicated. Electronically Signed   By: Corrie Mckusick D.O.   On: 11/14/2018 12:01    Medications: I have reviewed the patient's current medications.  Assessment:   1.  CBD stone.  This was removed yesterday sphincterotomy performed large amount of stones and sludge removed.  Long discussion  with the patient.  Told him that this could affect his health but was not causing problems with C diff, renal failure or any other problems.  However reinforced to him that his C. difficile on this admission was negative.  He is eating tolerating a diet and seems to otherwise be doing well.   Plan: Patient tolerating diet.  Do not feel he needs any other GI procedures at this point. We will sign off please call us if further GI problems.   [name] 11/15/2018, 9:54 AM  This note was created using voice recognition software. Minor errors may Have occurred unintentionally.  Pager: [phone] If no answer or after hours call [phone]

## 2018-11-15 NOTE — Progress Notes (Signed)
Hemodialysis Treatment Canceled for Today  Hemodialysis canceled for today by Dr Jonnie Finner Rescheduled for (date): 2/16 Patient and patient's nurse notified of schedule change at [facility]):[name] Bolingbroke, RN @ [phone] Orders written for today will be used for next scheduled hemodialysis treatment unless a new order is written.

## 2018-11-15 NOTE — Progress Notes (Addendum)
PROGRESS NOTE        PATIENT DETAILS Name: [name] Age: 68 y.o. Sex: male Date of Birth: 08/09/1951 Admit Date: 11/11/2018 Admitting Physician Jesus Genera, MD VHQ:IONG, Hunt Oris, MD  Brief Narrative: Patient is a 68 y.o. male ESRD, prior history of recurrent Klebsiella bacteremia, recent history of C. difficile colitis DM-2, hypothyroidism, OSA on CPAP presented to the hospital on 2/11 from HD for hypotension, fever,transient abdominal pain, thought to have septic shock with unknown foci-admitted to the ICU with broad-spectrum antimicrobial therapy, and vasopressor support.  Upon clinical stability, transferred to the triad hospitalist service on 2/13.  On further evaluation, patient was found to have intra-/extrahepatic ductal dilatation-underwent MRCP on 2/13 that confirmed choledocholithiasis (likely cause of recurrent bacteremia)-GI was subsequently consulted with plans for ERCP later today.  Subjective: Patient in bed, appears comfortable, denies any headache, no fever, no chest pain or pressure, no shortness of breath , no abdominal pain. No focal weakness.   Assessment/Plan:  Septic shock due to cholangitis with choledocholithiasis with history of cholecystectomy in the past: He underwent CT scan abdomen pelvis along with ERCP and MRCP.  ERCP was done 11/14/2018 - Sphincterotomy was performed, balloon sweep was performed initially with 9/12 mm balloon, followed by 12/15 mm balloon. Large amount of sludge and stone was retrieved.  He is currently on IV Ancef sepsis pathophysiology seems to have resolved continue to taper steroids, continue supportive care.  He is status post cholecystectomy in the past.  Cultures here negative but past history of Klebsiella bacteremia.  ESRD on HD TTS: Nephrology following and directing care.  AV malfunction on 2/13-underwent fistulogram with by IR on 2/14.  Underwent HD late on 11/14/2018 and will have another HD on  11/15/2018.   Transaminitis: Could be from shock liver or passed gall stone-LFTs have markedly improved.  Continue to follow periodically.   History of CVA: No focal deficits-continue aspirin.  History of depression: Continue Celexa  History of peripheral neuropathy: Continue gabapentin  Hyperthyroidism: Continue Tapazole  Gout: No evidence of flare, continue allopurinol and colchicine.  OSA: CPAP nightly.   C diff Colitis - on PO Vanco, latest C diff -ve. Complete course.    DVT Prophylaxis: Prophylactic Heparin   Code Status: Full code   Family Communication: None at bedside  Disposition Plan: Remain inpatient  Antimicrobial agents: Anti-infectives (From admission, onward)   Start     Dose/Rate Route Frequency Ordered Stop   11/14/18 2200  vancomycin (VANCOCIN) 125 MG capsule 125 mg  Status:  Discontinued     125 mg Oral 2 times daily 11/14/18 1636 11/14/18 1708   11/14/18 0000  ceFAZolin (ANCEF) IVPB 1 g/50 mL premix     1 g 100 mL/hr over 30 Minutes Intravenous Every 24 hours 11/13/18 1430     11/13/18 1200  vancomycin (VANCOCIN) IVPB 1000 mg/200 mL premix  Status:  Discontinued     1,000 mg 200 mL/hr over 60 Minutes Intravenous Every T-Th-Sa (Hemodialysis) 11/11/18 1906 11/11/18 2143   11/13/18 1200  ceFEPIme (MAXIPIME) 2 g in sodium chloride 0.9 % 100 mL IVPB  Status:  Discontinued     2 g 200 mL/hr over 30 Minutes Intravenous Every T-Th-Sa (Hemodialysis) 11/11/18 1906 11/11/18 2103   11/11/18 2300  vancomycin (VANCOCIN) 50 mg/mL oral solution 125 mg     125 mg Oral Every  12 hours 11/11/18 2201     11/11/18 2300  cefTRIAXone (ROCEPHIN) 2 g in sodium chloride 0.9 % 100 mL IVPB  Status:  Discontinued     2 g 200 mL/hr over 30 Minutes Intravenous Every 24 hours 11/11/18 2202 11/13/18 1314   11/11/18 1600  ceFEPIme (MAXIPIME) 2 g in sodium chloride 0.9 % 100 mL IVPB     2 g 200 mL/hr over 30 Minutes Intravenous  Once 11/11/18 1548 11/11/18 1744   11/11/18 1600   metroNIDAZOLE (FLAGYL) IVPB 500 mg  Status:  Discontinued     500 mg 100 mL/hr over 60 Minutes Intravenous Every 8 hours 11/11/18 1548 11/11/18 2103   11/11/18 1600  vancomycin (VANCOCIN) IVPB 1000 mg/200 mL premix  Status:  Discontinued     1,000 mg 200 mL/hr over 60 Minutes Intravenous  Once 11/11/18 1548 11/11/18 1556   11/11/18 1600  vancomycin (VANCOCIN) 2,000 mg in sodium chloride 0.9 % 500 mL IVPB     2,000 mg 250 mL/hr over 120 Minutes Intravenous  Once 11/11/18 1556 11/11/18 2103      Procedures:  2/14 ERCP -   Sphincterotomy was performed, balloon sweep was performed initially with 9/12 mm balloon, followed by 12/15 mm balloon. Large amount of sludge and stone was retrieved. At the conclusion, occlusive cholangiogram did not reveal any further filling defects. The pancreatic duct was never injected or cannulated during the entire procedure.  2/14>>Fistulagram of right UE brachiocephalic fistula. PTA of the proximal venous outflow extending from AV connection to cephalic vein aneurysm. PTA with 70mm and 27mm.   CONSULTS:  pulmonary/intensive care and ID  GI  Time spent: 25- minutes-Greater than 50% of this time was spent in counseling, explanation of diagnosis, planning of further management, and coordination of care.  MEDICATIONS: Scheduled Meds: . allopurinol  100 mg Oral Daily  . aspirin EC  325 mg Oral Daily  . Chlorhexidine Gluconate Cloth  6 each Topical Q0600  . Chlorhexidine Gluconate Cloth  6 each Topical Q0600  . cinacalcet  90 mg Oral BID WC  . citalopram  10 mg Oral QHS  . colchicine  0.6 mg Oral Daily  . colesevelam  1,875 mg Oral BID WC  . doxercalciferol  2 mcg Intravenous Q T,Th,Sa-HD  . ezetimibe  10 mg Oral Daily  . feeding supplement (NEPRO CARB STEADY)  237 mL Oral BID BM  . ferric citrate  420 mg Oral TID WC  . gabapentin  300 mg Oral QHS  . hydrocortisone sod succinate (SOLU-CORTEF) inj  25 mg Intravenous Q12H  . insulin aspart  0-9 Units  Subcutaneous TID WC  . methimazole  2.5 mg Oral Daily  . midodrine  5 mg Oral TID WC  . multivitamin  1 tablet Oral QHS  . pantoprazole  40 mg Oral Daily  . vancomycin  125 mg Oral Q12H   Continuous Infusions: . sodium chloride 10 mL/hr at 11/14/18 2346  .  ceFAZolin (ANCEF) IV 1 g (11/14/18 2348)   PRN Meds:.   PHYSICAL EXAM: Vital signs: Vitals:   11/14/18 2015 11/14/18 2030 11/14/18 2108 11/15/18 0439  BP: (!) 146/87 (!) 150/86 (!) 159/84 133/73  Pulse: 67 67 62 72  Resp:  16 18 18   Temp:  (!) 97.5 F (36.4 C) (!) 97.4 F (36.3 C) 98.4 F (36.9 C)  TempSrc:  Oral Oral Oral  SpO2:  95% 99% 97%  Weight:  102 kg    Height:  Filed Weights   11/14/18 0510 11/14/18 1720 11/14/18 2030  Weight: 106.4 kg 104.7 kg 102 kg   Body mass index is 29.67 kg/m.   Exam  Awake Alert, Oriented X 3, No new F.N deficits, Normal affect [place].AT,PERRAL Supple Neck,No JVD, No cervical lymphadenopathy appriciated.  Symmetrical Chest wall movement, Good air movement bilaterally, CTAB RRR,No Gallops, Rubs or new Murmurs, No Parasternal Heave +ve B.Sounds, Abd Soft, No tenderness, No organomegaly appriciated, No rebound - guarding or rigidity. No Cyanosis, Clubbing or edema, No new Rash or bruise   I have personally reviewed following labs and imaging studies  LABORATORY DATA: CBC: Recent Labs  Lab 11/11/18 1701 11/12/18 0309 11/13/18 0357 11/14/18 0539 11/15/18 0345  WBC 8.2 18.7* 12.5* 8.5 6.1  NEUTROABS 7.5  --   --   --   --   HGB 13.1 11.4* 10.6* 11.0* 11.0*  HCT 43.0 37.4* 34.1* 35.6* 33.8*  MCV 98.9 96.4 96.3 94.2 92.6  PLT 233 216 189 198 240    Basic Metabolic Panel: Recent Labs  Lab 11/12/18 0309 11/12/18 1557 11/13/18 0357 11/14/18 0539 11/15/18 0345  NA 138 141 140 141 139  K 6.2* 5.5* 5.4* 5.1 4.3  CL 105 106 105 107 101  CO2 24 21* 18* 19* 23  GLUCOSE 212* 111* 110* 97 134*  BUN 43* 52* 56* 72* 42*  CREATININE 9.73* 10.77* 11.60* 13.15* 9.29*    CALCIUM 7.8* 8.3* 7.9* 7.9* 7.7*  MG 1.9  --   --   --   --   PHOS 3.4  --  5.3* 7.1* 6.8*    GFR: Estimated Creatinine Clearance: 9.4 mL/min (A) (by C-G formula based on SCr of 9.29 mg/dL (H)).  Liver Function Tests: Recent Labs  Lab 11/11/18 1701 11/12/18 0309 11/13/18 0357 11/14/18 0539 11/15/18 0345  AST 332* 224*  --  31 24  ALT 211* 230*  --  95* 63*  ALKPHOS 181* 170*  --  126 120  BILITOT 1.9* 2.4*  --  1.0 1.1  PROT 7.3 6.6  --  6.5 6.3*  ALBUMIN 3.2* 2.8* 2.5* 2.5*  2.6* 2.6*  2.6*   No results for input(s): LIPASE, AMYLASE in the last 168 hours. No results for input(s): AMMONIA in the last 168 hours.  Coagulation Profile: Recent Labs  Lab 11/14/18 0539  INR 0.99    Cardiac Enzymes: No results for input(s): CKTOTAL, CKMB, CKMBINDEX, TROPONINI in the last 168 hours.  BNP (last 3 results) No results for input(s): PROBNP in the last 8760 hours.  HbA1C: No results for input(s): HGBA1C in the last 72 hours.  CBG: Recent Labs  Lab 11/14/18 1407 11/14/18 1452 11/14/18 1700 11/14/18 2120 11/15/18 0812  GLUCAP 90 75 80 101* 101*    Lipid Profile: No results for input(s): CHOL, HDL, LDLCALC, TRIG, CHOLHDL, LDLDIRECT in the last 72 hours.  Thyroid Function Tests: No results for input(s): TSH, T4TOTAL, FREET4, T3FREE, THYROIDAB in the last 72 hours.  Anemia Panel: No results for input(s): VITAMINB12, FOLATE, FERRITIN, TIBC, IRON, RETICCTPCT in the last 72 hours.  Urine analysis:    Component Value Date/Time   COLORURINE LT. YELLOW 06/26/2013 1200   APPEARANCEUR CLEAR 06/26/2013 1200   LABSPEC 1.020 06/26/2013 1200   PHURINE 8.5 06/26/2013 1200   GLUCOSEU 100 06/26/2013 1200   HGBUR MODERATE 06/26/2013 1200   BILIRUBINUR NEGATIVE 06/26/2013 1200   KETONESUR NEGATIVE 06/26/2013 1200   PROTEINUR >300 (A) 01/04/2010 1032   UROBILINOGEN 0.2 06/26/2013 1200  NITRITE NEGATIVE 06/26/2013 1200   LEUKOCYTESUR TRACE 06/26/2013 1200    Sepsis  Labs: Lactic Acid, Venous    Component Value Date/Time   LATICACIDVEN 1.5 11/12/2018 0309    MICROBIOLOGY: Recent Results (from the past 240 hour(s))  MRSA PCR Screening     Status: None   Collection Time: 11/11/18 10:12 PM  Result Value Ref Range Status   MRSA by PCR NEGATIVE NEGATIVE Final    Comment:        The GeneXpert MRSA Assay (FDA approved for NASAL specimens only), is one component of a comprehensive MRSA colonization surveillance program. It is not intended to diagnose MRSA infection nor to guide or monitor treatment for MRSA infections. Performed at [facility], [place] [address], [place], [place] [address]   Blood Culture (routine x 2)     Status: None (Preliminary result)   Collection Time: 11/11/18 10:50 PM  Result Value Ref Range Status   Specimen Description BLOOD LEFT HAND  Final   Special Requests   Final    BOTTLES DRAWN AEROBIC ONLY Blood Culture results may not be optimal due to an inadequate volume of blood received in culture bottles   Culture   Final    NO GROWTH 3 DAYS Performed at [facility], [place] [address] [place], [place] [address]    Report Status PENDING  Incomplete  Blood Culture (routine x 2)     Status: None (Preliminary result)   Collection Time: 11/11/18 11:10 PM  Result Value Ref Range Status   Specimen Description BLOOD LEFT WRIST  Final   Special Requests   Final    BOTTLES DRAWN AEROBIC ONLY Blood Culture results may not be optimal due to an inadequate volume of blood received in culture bottles   Culture   Final    NO GROWTH 3 DAYS Performed at [facility], [place] [address], [place], [place] [address]    Report Status PENDING  Incomplete  C difficile quick scan w PCR reflex     Status: None   Collection Time: 11/12/18  4:51 PM  Result Value Ref Range Status   C Diff antigen NEGATIVE NEGATIVE Final   C Diff toxin NEGATIVE NEGATIVE Final   C Diff interpretation No C. difficile detected.  Final    Comment:  Performed at [facility], [place] [address] [place], [place] [address]    RADIOLOGY STUDIES/RESULTS: Ct Abdomen Pelvis W Contrast  Result Date: 11/11/2018 CLINICAL DATA:  Generalized abdomen pain with fever EXAM: CT ABDOMEN AND PELVIS WITH CONTRAST TECHNIQUE: Multidetector CT imaging of the abdomen and pelvis was performed using the standard protocol following bolus administration of intravenous contrast. CONTRAST:  184mL OMNIPAQUE IOHEXOL 300 MG/ML  SOLN COMPARISON:  October 04, 2018 FINDINGS: Lower chest: The heart size is enlarged. Mild atelectasis of posterior lung bases are noted. Right lung base nodule 4 mm unchanged. Hepatobiliary: Mild diffuse low density of liver is identified. Patient status post prior cholecystectomy with intrahepatic biliary ductal dilatation and extrahepatic biliary ductal dilatation identified. Pneumobilia is noted. Pancreas: Unremarkable. No pancreatic ductal dilatation or surrounding inflammatory changes. Spleen: Normal in size without focal abnormality. Adrenals/Urinary Tract: The adrenal glands are normal. Bilateral kidney cysts are identified in atrophic kidneys bilaterally. Renal vascular calcifications are identified bilaterally. There is no hydronephrosis bilaterally. The bladder is decompressed limiting evaluation. Stomach/Bowel: Stomach is within normal limits. Appendix appears normal. No evidence of bowel wall thickening, distention, or inflammatory changes. Vascular/Lymphatic: Aortic atherosclerosis. No  enlarged abdominal or pelvic lymph nodes. Reproductive: Surgical clips are identified in the prostate gland unchanged. Other: None. Musculoskeletal: Degenerative joint changes of the spine are noted. Patient status post prior posterior fusion of lower lumbar spine. IMPRESSION: No acute abnormality identified in the abdomen and pelvis. Fatty infiltration of liver. Status post prior cholecystectomy with intra and extrahepatic biliary ductal dilatation, postsurgical.  Atrophic bilateral kidneys. Electronically Signed   By: Abelardo Diesel M.D.   On: 11/11/2018 19:58   Mr Abdomen Mrcp Wo Contrast  Result Date: 11/13/2018 CLINICAL DATA:  Sepsis.  History of cholecystectomy. EXAM: MRI ABDOMEN WITHOUT CONTRAST  (INCLUDING MRCP) TECHNIQUE: Multiplanar multisequence MR imaging of the abdomen was performed. Heavily T2-weighted images of the biliary and pancreatic ducts were obtained, and three-dimensional MRCP images were rendered by post processing. COMPARISON:  CT scan 11/11/2018 FINDINGS: Lower chest: Bibasilar atelectasis. No pleural or pericardial effusions. Hepatobiliary: Intra and extrahepatic biliary dilatation is demonstrated. The common bile duct measures up to 14 mm. There is an 8 mm distal common bile duct stone along with a few other smaller stones layering dependently. Small calculi are also noted in the cystic duct remnant. There is also non dependent pneumobilia noted. Pancreas:  No mass, inflammation or ductal dilatation. Spleen:  Normal size.  No focal lesions. Adrenals/Urinary Tract: The adrenal glands are unremarkable and stable. The kidneys are small and there are numerous cysts, typical findings related to dialysis. Stomach/Bowel: Visualized portions within the abdomen are unremarkable. Vascular/Lymphatic: No pathologically enlarged lymph nodes identified. No abdominal aortic aneurysm demonstrated. Other:  No ascites or abdominal wall hernia. Musculoskeletal: No significant bony findings. Spinal fusion hardware is noted. IMPRESSION: 1. 8 mm distal common bile duct stones along with a few other small common bile duct stones. I suspect there also small stones in the cystic duct remnant. There is also pneumobilia with some non dependent air bubbles in the biliary tree. 2. Unremarkable MR appearance of the pancreas. 3. Small/atrophied kidneys with numerous cysts. Electronically Signed   By: Marijo Sanes M.D.   On: 11/13/2018 20:53   Mr 3d Recon At  Scanner  Result Date: 11/13/2018 CLINICAL DATA:  Sepsis.  History of cholecystectomy. EXAM: MRI ABDOMEN WITHOUT CONTRAST  (INCLUDING MRCP) TECHNIQUE: Multiplanar multisequence MR imaging of the abdomen was performed. Heavily T2-weighted images of the biliary and pancreatic ducts were obtained, and three-dimensional MRCP images were rendered by post processing. COMPARISON:  CT scan 11/11/2018 FINDINGS: Lower chest: Bibasilar atelectasis. No pleural or pericardial effusions. Hepatobiliary: Intra and extrahepatic biliary dilatation is demonstrated. The common bile duct measures up to 14 mm. There is an 8 mm distal common bile duct stone along with a few other smaller stones layering dependently. Small calculi are also noted in the cystic duct remnant. There is also non dependent pneumobilia noted. Pancreas:  No mass, inflammation or ductal dilatation. Spleen:  Normal size.  No focal lesions. Adrenals/Urinary Tract: The adrenal glands are unremarkable and stable. The kidneys are small and there are numerous cysts, typical findings related to dialysis. Stomach/Bowel: Visualized portions within the abdomen are unremarkable. Vascular/Lymphatic: No pathologically enlarged lymph nodes identified. No abdominal aortic aneurysm demonstrated. Other:  No ascites or abdominal wall hernia. Musculoskeletal: No significant bony findings. Spinal fusion hardware is noted. IMPRESSION: 1. 8 mm distal common bile duct stones along with a few other small common bile duct stones. I suspect there also small stones in the cystic duct remnant. There is also pneumobilia with some non dependent air bubbles in the biliary  tree. 2. Unremarkable MR appearance of the pancreas. 3. Small/atrophied kidneys with numerous cysts. Electronically Signed   By: Marijo Sanes M.D.   On: 11/13/2018 20:53   Ir US Guide Vasc Access Right  Result Date: 11/14/2018 INDICATION: 68 year old male with a history of poor flow of right upper extremity fistula during  dialysis EXAM: ULTRASOUND-GUIDED ACCESS RIGHT UPPER EXTREMITY FISTULA ANGIOPLASTY VENOUS OUTFLOW STENOSIS MEDICATIONS: 1.5 mg Versed, 75 mcg fentanyl ANESTHESIA/SEDATION: Moderate Sedation Time:  29 minutes The patient was continuously monitored during the procedure by the interventional radiology nurse under my direct supervision. FLUOROSCOPY TIME:  Fluoroscopy Time: 3 minutes 54 seconds (32 mGy). COMPLICATIONS: None PROCEDURE: Informed written consent was obtained from the patient after a thorough discussion of the procedural risks, benefits and alternatives. All questions were addressed. Maximal Sterile Barrier Technique was utilized including caps, mask, sterile gowns, sterile gloves, sterile drape, hand hygiene and skin antiseptic. A timeout was performed prior to the initiation of the procedure. Patient's right arm was prepped and draped in the usual sterile fashion, 90 degree abduction. Ultrasound images were stored sent to PACs. 1% lidocaine was used for local anesthesia. Ultrasound guidance was used to puncture the fistula directed centrally. With the micro puncture in place, images were performed of the upper extremity including reflux images to the arteriovenous connection. Given that the narrowing was identified just beyond the AV connection, the access was reversed towards the AV connection. This was done using fluoroscopy and Glidewire. Six French sheath was then placed over the Glidewire. Balloon angioplasty was then performed with 4 mm x 40 mm balloon and then 5 mm x 40 mm balloon. Less than 30% residual was identified at the completion. Repeat images were performed. Excellent thrill was confirmed. All wires catheters balloons and the sheath were removed placing a stay suture. Patient tolerated the procedure well and remained hemodynamically stable throughout. No complications were encountered and no significant blood loss. IMPRESSION: Status post ultrasound-guided access of the right upper extremity  fistula for fistulagram and balloon angioplasty of proximal venous outflow stenosis, with less than 30% residual at the completion. Excellent thrill confirmed upon completion. Signed, Dulcy Fanny. Dellia Nims, RPVI [facility] Radiology ACCESS: This access remains amenable to future percutaneous interventions as clinically indicated. Electronically Signed   By: Corrie Mckusick D.O.   On: 11/14/2018 12:01   Dg Chest Port 1 View  Result Date: 11/11/2018 CLINICAL DATA:  Fever and altered mental status EXAM: PORTABLE CHEST 1 VIEW COMPARISON:  October 03, 2018 FINDINGS: The heart size and mediastinal contours are stable. The aorta is tortuous. There is mild central pulmonary vascular congestion. There is no frank pulmonary edema, focal pneumonia or pleural effusion. The visualized skeletal structures are stable. IMPRESSION: Mild central pulmonary vascular congestion. Electronically Signed   By: Abelardo Diesel M.D.   On: 11/11/2018 16:08   Dg Ercp  Result Date: 11/14/2018 CLINICAL DATA:  68 year old male with choledocholithiasis EXAM: ERCP TECHNIQUE: Multiple spot images obtained with the fluoroscopic device and submitted for interpretation post-procedure. FLUOROSCOPY TIME:  Fluoroscopy Time:  5 minutes 28 seconds reported COMPARISON:  MRCP 11/13/2018 FINDINGS: A total of 4 intraoperative spot images are submitted for review. The images demonstrate a flexible endoscope in the descending duodenum with wire cannulation of the common bile duct. Subsequent cholangiography demonstrates intra and extrahepatic biliary ductal dilatation. Subsequent images show sphincterotomy and balloon sweep of the common duct. IMPRESSION: ERCP as above. These images were submitted for radiologic interpretation only. Please see  the procedural report for the amount of contrast and the fluoroscopy time utilized. Electronically Signed   By: Jacqulynn Cadet M.D.   On: 11/14/2018 15:46   Ir Av Dialy  Shunt Intro Needle/intracath Initial W/pta/img Right  Result Date: 11/14/2018 INDICATION: 69 year old male with a history of poor flow of right upper extremity fistula during dialysis EXAM: ULTRASOUND-GUIDED ACCESS RIGHT UPPER EXTREMITY FISTULA ANGIOPLASTY VENOUS OUTFLOW STENOSIS MEDICATIONS: 1.5 mg Versed, 75 mcg fentanyl ANESTHESIA/SEDATION: Moderate Sedation Time:  29 minutes The patient was continuously monitored during the procedure by the interventional radiology nurse under my direct supervision. FLUOROSCOPY TIME:  Fluoroscopy Time: 3 minutes 54 seconds (32 mGy). COMPLICATIONS: None PROCEDURE: Informed written consent was obtained from the patient after a thorough discussion of the procedural risks, benefits and alternatives. All questions were addressed. Maximal Sterile Barrier Technique was utilized including caps, mask, sterile gowns, sterile gloves, sterile drape, hand hygiene and skin antiseptic. A timeout was performed prior to the initiation of the procedure. Patient's right arm was prepped and draped in the usual sterile fashion, 90 degree abduction. Ultrasound images were stored sent to PACs. 1% lidocaine was used for local anesthesia. Ultrasound guidance was used to puncture the fistula directed centrally. With the micro puncture in place, images were performed of the upper extremity including reflux images to the arteriovenous connection. Given that the narrowing was identified just beyond the AV connection, the access was reversed towards the AV connection. This was done using fluoroscopy and Glidewire. Six French sheath was then placed over the Glidewire. Balloon angioplasty was then performed with 4 mm x 40 mm balloon and then 5 mm x 40 mm balloon. Less than 30% residual was identified at the completion. Repeat images were performed. Excellent thrill was confirmed. All wires catheters balloons and the sheath were removed placing a stay suture. Patient tolerated the procedure well and remained  hemodynamically stable throughout. No complications were encountered and no significant blood loss. IMPRESSION: Status post ultrasound-guided access of the right upper extremity fistula for fistulagram and balloon angioplasty of proximal venous outflow stenosis, with less than 30% residual at the completion. Excellent thrill confirmed upon completion. Signed, Dulcy Fanny. Dellia Nims, RPVI [facility] Radiology ACCESS: This access remains amenable to future percutaneous interventions as clinically indicated. Electronically Signed   By: Corrie Mckusick D.O.   On: 11/14/2018 12:01     LOS: 4 days   Signature  Lala Lund M.D on 11/15/2018 at 11:58 AM   -  To page go to [email or web address]

## 2018-11-15 NOTE — Progress Notes (Signed)
[facility] Progress Note  Subjective: had ERCP yest w/ clearing of CBD stones and sludge and sphincterotomy. No other findings.  Feeling "tired" still today, no new c/o's. Abd pain better. All cx's negative. Cdif neg and mRSA swab neg.   Vitals:   11/14/18 2015 11/14/18 2030 11/14/18 2108 11/15/18 0439  BP: (!) 146/87 (!) 150/86 (!) 159/84 133/73  Pulse: 67 67 62 72  Resp:  16 18 18   Temp:  (!) 97.5 F (36.4 C) (!) 97.4 F (36.3 C) 98.4 F (36.9 C)  TempSrc:  Oral Oral Oral  SpO2:  95% 99% 97%  Weight:  102 kg    Height:        Inpatient medications: . allopurinol  100 mg Oral Daily  . aspirin EC  325 mg Oral Daily  . Chlorhexidine Gluconate Cloth  6 each Topical Q0600  . Chlorhexidine Gluconate Cloth  6 each Topical Q0600  . cinacalcet  90 mg Oral BID WC  . citalopram  10 mg Oral QHS  . colchicine  0.6 mg Oral Daily  . colesevelam  1,875 mg Oral BID WC  . doxercalciferol  2 mcg Intravenous Q T,Th,Sa-HD  . ezetimibe  10 mg Oral Daily  . feeding supplement (NEPRO CARB STEADY)  237 mL Oral BID BM  . ferric citrate  420 mg Oral TID WC  . gabapentin  300 mg Oral QHS  . [START ON 11/16/2018] hydrocortisone sod succinate (SOLU-CORTEF) inj  25 mg Intravenous Daily  . insulin aspart  0-9 Units Subcutaneous TID WC  . methimazole  2.5 mg Oral Daily  . midodrine  5 mg Oral TID WC  . multivitamin  1 tablet Oral QHS  . pantoprazole  40 mg Oral Daily  . vancomycin  125 mg Oral Q12H   . sodium chloride 10 mL/hr at 11/14/18 2346  .  ceFAZolin (ANCEF) IV 1 g (11/14/18 2348)     Iron/TIBC/Ferritin/ %Sat    Component Value Date/Time   IRON 16 (L) 06/12/2018 0500   TIBC 168 (L) 06/12/2018 0500   FERRITIN 709 (H) 10/17/2016 1103   IRONPCTSAT 10 (L) 06/12/2018 0500    Exam: Gen - pt not in room Chest - RRR - Abd - MS - Ext - Neuro - RUA AVF    Home meds:  - alirocumab 150mg  every 2 wks  - allopurinol 100 qd/ colchicine 0.6 qd/ ferric citrate 420mg  tid  ac  - pantoprazole 40 hs/ methimazole 2.5 mg qd/ sensipar 90 qd  - welchol 1875mg  bid ac/ ezetimibe 10 qd/ aspirin 325 qd  - citalopram 10 qd/ zolpidem 5mg  hs prn  - po vancomycin 125mg  qid    [facility]  4h  97.5kg   2/2.25 bath  P2  Hep none  RUA AVF  - hect 2 ug  - no esa   Assessment: 1. Cholangitis/ CBD stones: sp ERCP 2/14 w/ sphincterotomy and removal of stones/ sludge, on IV abx as well 2. ESRD on HD: AVF malfunction sp fistulogram by IR 2/14 w/ intervention. Had recent intervention at [facility] as well.  3. Volume: 2.5 kg off on HD yest, still up around 4-5kg 4. Hypotension - resolved 5. MBD of ckd - cont meds 6. Anemia of ckd - no esa at OP center  P: 1. HD again today, get vol down further.       [facility] 11/15/2018, 12:38 PM  Recent Labs  Lab 11/14/18 0539 11/15/18 0345  NA 141  139  K 5.1 4.3  CL 107 101  CO2 19* 23  GLUCOSE 97 134*  BUN 72* 42*  CREATININE 13.15* 9.29*  CALCIUM 7.9* 7.7*  PHOS 7.1* 6.8*  ALBUMIN 2.5*  2.6* 2.6*  2.6*  INR 0.99  --    Recent Labs  Lab 11/14/18 0539 11/15/18 0345  AST 31 24  ALT 95* 63*  ALKPHOS 126 120  BILITOT 1.0 1.1  PROT 6.5 6.3*   Recent Labs  Lab 11/11/18 1701  11/14/18 0539 11/15/18 0345  WBC 8.2   < > 8.5 6.1  NEUTROABS 7.5  --   --   --   HGB 13.1   < > 11.0* 11.0*  HCT 43.0   < > 35.6* 33.8*  MCV 98.9   < > 94.2 92.6  PLT 233   < > 198 198   < > = values in this interval not displayed.

## 2018-11-15 NOTE — Progress Notes (Signed)
PT Cancellation Note  Patient Details Name: [name] MRN: [id number] DOB: [birth year]   Cancelled Treatment:    Reason Eval/Treat Not Completed: Other (comment). Pt adamantly refusing PT evaluation, stating, "I'm not doing physical therapy today. I went through 3 things yesterday and I'm weak and tired."   Ellamae Sia, [place], DPT Acute Rehabilitation Services Pager [phone] Office [phone]    Willy Eddy 11/15/2018, 3:43 PM

## 2018-11-15 NOTE — Progress Notes (Signed)
Subjective:  Feels quite fatigued   Antibiotics:  Anti-infectives (From admission, onward)   Start     Dose/Rate Route Frequency Ordered Stop   11/15/18 2200  ceFAZolin (ANCEF) IVPB 1 g/50 mL premix     1 g 100 mL/hr over 30 Minutes Intravenous Every 24 hours 11/15/18 1245     11/14/18 2200  vancomycin (VANCOCIN) 125 MG capsule 125 mg  Status:  Discontinued     125 mg Oral 2 times daily 11/14/18 1636 11/14/18 1708   11/14/18 0000  ceFAZolin (ANCEF) IVPB 1 g/50 mL premix  Status:  Discontinued     1 g 100 mL/hr over 30 Minutes Intravenous Every 24 hours 11/13/18 1430 11/15/18 1245   11/13/18 1200  vancomycin (VANCOCIN) IVPB 1000 mg/200 mL premix  Status:  Discontinued     1,000 mg 200 mL/hr over 60 Minutes Intravenous Every T-Th-Sa (Hemodialysis) 11/11/18 1906 11/11/18 2143   11/13/18 1200  ceFEPIme (MAXIPIME) 2 g in sodium chloride 0.9 % 100 mL IVPB  Status:  Discontinued     2 g 200 mL/hr over 30 Minutes Intravenous Every T-Th-Sa (Hemodialysis) 11/11/18 1906 11/11/18 2103   11/11/18 2300  vancomycin (VANCOCIN) 50 mg/mL oral solution 125 mg     125 mg Oral Every 12 hours 11/11/18 2201     11/11/18 2300  cefTRIAXone (ROCEPHIN) 2 g in sodium chloride 0.9 % 100 mL IVPB  Status:  Discontinued     2 g 200 mL/hr over 30 Minutes Intravenous Every 24 hours 11/11/18 2202 11/13/18 1314   11/11/18 1600  ceFEPIme (MAXIPIME) 2 g in sodium chloride 0.9 % 100 mL IVPB     2 g 200 mL/hr over 30 Minutes Intravenous  Once 11/11/18 1548 11/11/18 1744   11/11/18 1600  metroNIDAZOLE (FLAGYL) IVPB 500 mg  Status:  Discontinued     500 mg 100 mL/hr over 60 Minutes Intravenous Every 8 hours 11/11/18 1548 11/11/18 2103   11/11/18 1600  vancomycin (VANCOCIN) IVPB 1000 mg/200 mL premix  Status:  Discontinued     1,000 mg 200 mL/hr over 60 Minutes Intravenous  Once 11/11/18 1548 11/11/18 1556   11/11/18 1600  vancomycin (VANCOCIN) 2,000 mg in sodium chloride 0.9 % 500 mL IVPB     2,000 mg 250  mL/hr over 120 Minutes Intravenous  Once 11/11/18 1556 11/11/18 2103      Medications: Scheduled Meds: . allopurinol  100 mg Oral Daily  . aspirin EC  325 mg Oral Daily  . Chlorhexidine Gluconate Cloth  6 each Topical Q0600  . Chlorhexidine Gluconate Cloth  6 each Topical Q0600  . cinacalcet  90 mg Oral BID WC  . citalopram  10 mg Oral QHS  . colchicine  0.6 mg Oral Daily  . colesevelam  1,875 mg Oral BID WC  . doxercalciferol  2 mcg Intravenous Q T,Th,Sa-HD  . ezetimibe  10 mg Oral Daily  . feeding supplement (NEPRO CARB STEADY)  237 mL Oral BID BM  . ferric citrate  420 mg Oral TID WC  . gabapentin  300 mg Oral QHS  . [START ON 11/16/2018] hydrocortisone sod succinate (SOLU-CORTEF) inj  25 mg Intravenous Daily  . insulin aspart  0-9 Units Subcutaneous TID WC  . methimazole  2.5 mg Oral Daily  . midodrine  5 mg Oral TID WC  . multivitamin  1 tablet Oral QHS  . pantoprazole  40 mg Oral Daily  . vancomycin  125 mg Oral Q12H  Continuous Infusions: . sodium chloride 10 mL/hr at 11/14/18 2346  .  ceFAZolin (ANCEF) IV     PRN Meds:.acetaminophen, acetaminophen-codeine, polyethylene glycol, polyvinyl alcohol, triamcinolone cream, zolpidem    Objective: Weight change: 0.6 kg  Intake/Output Summary (Last 24 hours) at 11/15/2018 1440 Last data filed at 11/15/2018 0900 Gross per 24 hour  Intake 480 ml  Output 2510 ml  Net -2030 ml   Blood pressure (!) 104/56, pulse 78, temperature 98.4 F (36.9 C), temperature source Oral, resp. rate 18, height 6\' 1"  (1.854 m), weight 102 kg, SpO2 99 %. Temp:  [97.4 F (36.3 C)-98.4 F (36.9 C)] 98.4 F (36.9 C) (02/15 0439) Pulse Rate:  [54-78] 78 (02/15 1355) Resp:  [9-18] 18 (02/15 1355) BP: (104-178)/(56-90) 104/56 (02/15 1355) SpO2:  [94 %-100 %] 99 % (02/15 1355) Weight:  [102 kg-104.7 kg] 102 kg (02/14 2030)  Physical Exam: General: Alert and awake, oriented x3, not in any acute distress. HEENT: anicteric sclera, EOMI CVS  regular rate, normal  Chest: , no wheezing, no respiratory distress Abdomen: soft non-distended,  Extremities: no edema or deformity noted bilaterally Skin: no rashes Neuro: nonfocal  CBC:    BMET Recent Labs    11/14/18 0539 11/15/18 0345  NA 141 139  K 5.1 4.3  CL 107 101  CO2 19* 23  GLUCOSE 97 134*  BUN 72* 42*  CREATININE 13.15* 9.29*  CALCIUM 7.9* 7.7*     Liver Panel  Recent Labs    11/14/18 0539 11/15/18 0345  PROT 6.5 6.3*  ALBUMIN 2.5*  2.6* 2.6*  2.6*  AST 31 24  ALT 95* 63*  ALKPHOS 126 120  BILITOT 1.0 1.1  BILIDIR 0.2 0.2  IBILI 0.8 0.9       Sedimentation Rate No results for input(s): ESRSEDRATE in the last 72 hours. C-Reactive Protein No results for input(s): CRP in the last 72 hours.  Micro Results: Recent Results (from the past 720 hour(s))  MRSA PCR Screening     Status: None   Collection Time: 11/11/18 10:12 PM  Result Value Ref Range Status   MRSA by PCR NEGATIVE NEGATIVE Final    Comment:        The GeneXpert MRSA Assay (FDA approved for NASAL specimens only), is one component of a comprehensive MRSA colonization surveillance program. It is not intended to diagnose MRSA infection nor to guide or monitor treatment for MRSA infections. Performed at [facility], [place] [address] [place], [place] [address]   Blood Culture (routine x 2)     Status: None (Preliminary result)   Collection Time: 11/11/18 10:50 PM  Result Value Ref Range Status   Specimen Description BLOOD LEFT HAND  Final   Special Requests   Final    BOTTLES DRAWN AEROBIC ONLY Blood Culture results may not be optimal due to an inadequate volume of blood received in culture bottles   Culture   Final    NO GROWTH 3 DAYS Performed at [facility], [name] [address], [place], [place] [address]    Report Status PENDING  Incomplete  Blood Culture (routine x 2)     Status: None (Preliminary result)   Collection Time: 11/11/18 11:10 PM  Result Value  Ref Range Status   Specimen Description BLOOD LEFT WRIST  Final   Special Requests   Final    BOTTLES DRAWN AEROBIC ONLY Blood Culture results may not be optimal due to an inadequate volume of blood received in culture bottles  Culture   Final    NO GROWTH 3 DAYS Performed at [facility], [place] [address] [place], [place] [address]    Report Status PENDING  Incomplete  C difficile quick scan w PCR reflex     Status: None   Collection Time: 11/12/18  4:51 PM  Result Value Ref Range Status   C Diff antigen NEGATIVE NEGATIVE Final   C Diff toxin NEGATIVE NEGATIVE Final   C Diff interpretation No C. difficile detected.  Final    Comment: Performed at [facility], [place] [address] [place], [place] [address]    Studies/Results: Mr Abdomen Mrcp GE Contrast  Result Date: 11/13/2018 CLINICAL DATA:  Sepsis.  History of cholecystectomy. EXAM: MRI ABDOMEN WITHOUT CONTRAST  (INCLUDING MRCP) TECHNIQUE: Multiplanar multisequence MR imaging of the abdomen was performed. Heavily T2-weighted images of the biliary and pancreatic ducts were obtained, and three-dimensional MRCP images were rendered by post processing. COMPARISON:  CT scan 11/11/2018 FINDINGS: Lower chest: Bibasilar atelectasis. No pleural or pericardial effusions. Hepatobiliary: Intra and extrahepatic biliary dilatation is demonstrated. The common bile duct measures up to 14 mm. There is an 8 mm distal common bile duct stone along with a few other smaller stones layering dependently. Small calculi are also noted in the cystic duct remnant. There is also non dependent pneumobilia noted. Pancreas:  No mass, inflammation or ductal dilatation. Spleen:  Normal size.  No focal lesions. Adrenals/Urinary Tract: The adrenal glands are unremarkable and stable. The kidneys are small and there are numerous cysts, typical findings related to dialysis. Stomach/Bowel: Visualized portions within the abdomen are unremarkable. Vascular/Lymphatic: No  pathologically enlarged lymph nodes identified. No abdominal aortic aneurysm demonstrated. Other:  No ascites or abdominal wall hernia. Musculoskeletal: No significant bony findings. Spinal fusion hardware is noted. IMPRESSION: 1. 8 mm distal common bile duct stones along with a few other small common bile duct stones. I suspect there also small stones in the cystic duct remnant. There is also pneumobilia with some non dependent air bubbles in the biliary tree. 2. Unremarkable MR appearance of the pancreas. 3. Small/atrophied kidneys with numerous cysts. Electronically Signed   By: Marijo Sanes M.D.   On: 11/13/2018 20:53   Mr 3d Recon At Scanner  Result Date: 11/13/2018 CLINICAL DATA:  Sepsis.  History of cholecystectomy. EXAM: MRI ABDOMEN WITHOUT CONTRAST  (INCLUDING MRCP) TECHNIQUE: Multiplanar multisequence MR imaging of the abdomen was performed. Heavily T2-weighted images of the biliary and pancreatic ducts were obtained, and three-dimensional MRCP images were rendered by post processing. COMPARISON:  CT scan 11/11/2018 FINDINGS: Lower chest: Bibasilar atelectasis. No pleural or pericardial effusions. Hepatobiliary: Intra and extrahepatic biliary dilatation is demonstrated. The common bile duct measures up to 14 mm. There is an 8 mm distal common bile duct stone along with a few other smaller stones layering dependently. Small calculi are also noted in the cystic duct remnant. There is also non dependent pneumobilia noted. Pancreas:  No mass, inflammation or ductal dilatation. Spleen:  Normal size.  No focal lesions. Adrenals/Urinary Tract: The adrenal glands are unremarkable and stable. The kidneys are small and there are numerous cysts, typical findings related to dialysis. Stomach/Bowel: Visualized portions within the abdomen are unremarkable. Vascular/Lymphatic: No pathologically enlarged lymph nodes identified. No abdominal aortic aneurysm demonstrated. Other:  No ascites or abdominal wall hernia.  Musculoskeletal: No significant bony findings. Spinal fusion hardware is noted. IMPRESSION: 1. 8 mm distal common bile duct stones along with a few other small common bile  duct stones. I suspect there also small stones in the cystic duct remnant. There is also pneumobilia with some non dependent air bubbles in the biliary tree. 2. Unremarkable MR appearance of the pancreas. 3. Small/atrophied kidneys with numerous cysts. Electronically Signed   By: Marijo Sanes M.D.   On: 11/13/2018 20:53   Ir US Guide Vasc Access Right  Result Date: 11/14/2018 INDICATION: 68 year old male with a history of poor flow of right upper extremity fistula during dialysis EXAM: ULTRASOUND-GUIDED ACCESS RIGHT UPPER EXTREMITY FISTULA ANGIOPLASTY VENOUS OUTFLOW STENOSIS MEDICATIONS: 1.5 mg Versed, 75 mcg fentanyl ANESTHESIA/SEDATION: Moderate Sedation Time:  29 minutes The patient was continuously monitored during the procedure by the interventional radiology nurse under my direct supervision. FLUOROSCOPY TIME:  Fluoroscopy Time: 3 minutes 54 seconds (32 mGy). COMPLICATIONS: None PROCEDURE: Informed written consent was obtained from the patient after a thorough discussion of the procedural risks, benefits and alternatives. All questions were addressed. Maximal Sterile Barrier Technique was utilized including caps, mask, sterile gowns, sterile gloves, sterile drape, hand hygiene and skin antiseptic. A timeout was performed prior to the initiation of the procedure. Patient's right arm was prepped and draped in the usual sterile fashion, 90 degree abduction. Ultrasound images were stored sent to PACs. 1% lidocaine was used for local anesthesia. Ultrasound guidance was used to puncture the fistula directed centrally. With the micro puncture in place, images were performed of the upper extremity including reflux images to the arteriovenous connection. Given that the narrowing was identified just beyond the AV connection, the access was  reversed towards the AV connection. This was done using fluoroscopy and Glidewire. Six French sheath was then placed over the Glidewire. Balloon angioplasty was then performed with 4 mm x 40 mm balloon and then 5 mm x 40 mm balloon. Less than 30% residual was identified at the completion. Repeat images were performed. Excellent thrill was confirmed. All wires catheters balloons and the sheath were removed placing a stay suture. Patient tolerated the procedure well and remained hemodynamically stable throughout. No complications were encountered and no significant blood loss. IMPRESSION: Status post ultrasound-guided access of the right upper extremity fistula for fistulagram and balloon angioplasty of proximal venous outflow stenosis, with less than 30% residual at the completion. Excellent thrill confirmed upon completion. Signed, Dulcy Fanny. Dellia Nims, RPVI [facility] ACCESS: This access remains amenable to future percutaneous interventions as clinically indicated. Electronically Signed   By: Corrie Mckusick D.O.   On: 11/14/2018 12:01   Dg Ercp  Result Date: 11/14/2018 CLINICAL DATA:  68 year old male with choledocholithiasis EXAM: ERCP TECHNIQUE: Multiple spot images obtained with the fluoroscopic device and submitted for interpretation post-procedure. FLUOROSCOPY TIME:  Fluoroscopy Time:  5 minutes 28 seconds reported COMPARISON:  MRCP 11/13/2018 FINDINGS: A total of 4 intraoperative spot images are submitted for review. The images demonstrate a flexible endoscope in the descending duodenum with wire cannulation of the common bile duct. Subsequent cholangiography demonstrates intra and extrahepatic biliary ductal dilatation. Subsequent images show sphincterotomy and balloon sweep of the common duct. IMPRESSION: ERCP as above. These images were submitted for radiologic interpretation only. Please see the procedural report for the amount of contrast and  the fluoroscopy time utilized. Electronically Signed   By: Jacqulynn Cadet M.D.   On: 11/14/2018 15:46   Ir Av Dialy Shunt Intro Needle/intracath Initial W/pta/img Right  Result Date: 11/14/2018 INDICATION: 68 year old male with a history of poor flow of right upper extremity fistula during dialysis EXAM: ULTRASOUND-GUIDED  ACCESS RIGHT UPPER EXTREMITY FISTULA ANGIOPLASTY VENOUS OUTFLOW STENOSIS MEDICATIONS: 1.5 mg Versed, 75 mcg fentanyl ANESTHESIA/SEDATION: Moderate Sedation Time:  29 minutes The patient was continuously monitored during the procedure by the interventional radiology nurse under my direct supervision. FLUOROSCOPY TIME:  Fluoroscopy Time: 3 minutes 54 seconds (32 mGy). COMPLICATIONS: None PROCEDURE: Informed written consent was obtained from the patient after a thorough discussion of the procedural risks, benefits and alternatives. All questions were addressed. Maximal Sterile Barrier Technique was utilized including caps, mask, sterile gowns, sterile gloves, sterile drape, hand hygiene and skin antiseptic. A timeout was performed prior to the initiation of the procedure. Patient's right arm was prepped and draped in the usual sterile fashion, 90 degree abduction. Ultrasound images were stored sent to PACs. 1% lidocaine was used for local anesthesia. Ultrasound guidance was used to puncture the fistula directed centrally. With the micro puncture in place, images were performed of the upper extremity including reflux images to the arteriovenous connection. Given that the narrowing was identified just beyond the AV connection, the access was reversed towards the AV connection. This was done using fluoroscopy and Glidewire. Six French sheath was then placed over the Glidewire. Balloon angioplasty was then performed with 4 mm x 40 mm balloon and then 5 mm x 40 mm balloon. Less than 30% residual was identified at the completion. Repeat images were performed. Excellent thrill was confirmed. All wires  catheters balloons and the sheath were removed placing a stay suture. Patient tolerated the procedure well and remained hemodynamically stable throughout. No complications were encountered and no significant blood loss. IMPRESSION: Status post ultrasound-guided access of the right upper extremity fistula for fistulagram and balloon angioplasty of proximal venous outflow stenosis, with less than 30% residual at the completion. Excellent thrill confirmed upon completion. Signed, Dulcy Fanny. Dellia Nims, RPVI [facility] Radiology ACCESS: This access remains amenable to future percutaneous interventions as clinically indicated. Electronically Signed   By: Corrie Mckusick D.O.   On: 11/14/2018 12:01      Assessment/Plan:  INTERVAL HISTORY: Status post ERCP with removal of stone and large amount of sludge and sphincterotomy   Active Problems:   Recurrent bacteremia   Septic shock ([place])   Dialysis AV fistula malfunction ([name])   Recurrent Clostridioides difficile diarrhea    [name] is a 68 y.o. male with recurrent Klebsiella pneumoniae bacteremia which seems likely of to have been from persistent stones in the biliary tree now status post ERCP with removal of a large common bile duct stone as well as a large amount of sludge and sphincterotomy with insurance now that the duct is patent, also with a history of recurrent C. difficile infection and carriage   #1  Recurrent Klebsiella pneumonia bacteremia due to large common bile duct stone and stones obstructing the biliary tree:  Is clearly the reason for his recurrent bacteremias.  Fortunately this time he presented with sepsis he was not actually bacteremic.  --would complete a 10 day course and this can be done with IV cefazolin, could be converted to w HD when he DC home  We are happy to check surveillance blood cultures 2+ weeks after he completes his cefazolin  #2  History of recurrent C.  difficile colitis: Not clear that he actually has this right now but I would continue his vancomycin for now and extend this 10 days beyond his completion of his cefazolin.  I have counseled him on avoiding high risk antibiotics such as  fluoroquinolones clindamycin any unnecessary antibiotics.  He would like to follow-up in our clinic but would like his wife to schedule the appointment.  Please have her call our office to schedule an appointment.  Sign off for now please call with further questions.  LOS: 4 days   Alcide Evener 11/15/2018, 2:40 PM

## 2018-11-15 NOTE — Progress Notes (Signed)
RT offered to place pt on CPAP pt states he is able to place self on when he is ready for bed. RT checked water in chamber none needed at this time. RT will continue to monitor.

## 2018-11-16 ENCOUNTER — Encounter (HOSPITAL_COMMUNITY): Payer: Self-pay | Admitting: Gastroenterology

## 2018-11-16 DIAGNOSIS — A0472 Enterocolitis due to Clostridium difficile, not specified as recurrent: Secondary | ICD-10-CM

## 2018-11-16 DIAGNOSIS — T82590A Other mechanical complication of surgically created arteriovenous fistula, initial encounter: Secondary | ICD-10-CM

## 2018-11-16 LAB — CBC
HCT: 34.6 % — ABNORMAL LOW (ref 39.0–52.0)
Hemoglobin: 10.7 g/dL — ABNORMAL LOW (ref 13.0–17.0)
MCH: 29.2 pg (ref 26.0–34.0)
MCHC: 30.9 g/dL (ref 30.0–36.0)
MCV: 94.3 fL (ref 80.0–100.0)
Platelets: 208 10*3/uL (ref 150–400)
RBC: 3.67 MIL/uL — ABNORMAL LOW (ref 4.22–5.81)
RDW: 18.8 % — ABNORMAL HIGH (ref 11.5–15.5)
WBC: 7.2 10*3/uL (ref 4.0–10.5)
nRBC: 0 % (ref 0.0–0.2)

## 2018-11-16 LAB — RENAL FUNCTION PANEL
Albumin: 2.6 g/dL — ABNORMAL LOW (ref 3.5–5.0)
Anion gap: 11 (ref 5–15)
BUN: 59 mg/dL — ABNORMAL HIGH (ref 8–23)
CO2: 25 mmol/L (ref 22–32)
Calcium: 7.4 mg/dL — ABNORMAL LOW (ref 8.9–10.3)
Chloride: 102 mmol/L (ref 98–111)
Creatinine, Ser: 11.25 mg/dL — ABNORMAL HIGH (ref 0.61–1.24)
GFR calc Af Amer: 5 mL/min — ABNORMAL LOW (ref >60)
GFR calc non Af Amer: 4 mL/min — ABNORMAL LOW (ref >60)
GLUCOSE: 121 mg/dL — AB (ref 70–99)
Phosphorus: 6.7 mg/dL — ABNORMAL HIGH (ref 2.5–4.6)
Potassium: 4.2 mmol/L (ref 3.5–5.1)
Sodium: 138 mmol/L (ref 135–145)

## 2018-11-16 LAB — GLUCOSE, CAPILLARY: Glucose-Capillary: 97 mg/dL (ref 70–99)

## 2018-11-16 MED ORDER — CEFAZOLIN SODIUM-DEXTROSE 1-4 GM/50ML-% IV SOLN: 2.0000 g | INTRAVENOUS | Status: AC | PRN

## 2018-11-16 MED ORDER — DOXERCALCIFEROL 4 MCG/2ML IV SOLN
INTRAVENOUS | Status: AC
  Filled 2018-11-16: qty 2

## 2018-11-16 MED ORDER — VANCOMYCIN HCL 125 MG PO CAPS: 125.0000 mg | ORAL_CAPSULE | Freq: Three times a day (TID) | ORAL | 0 refills | Status: AC

## 2018-11-16 NOTE — Progress Notes (Signed)
PT Cancellation Note  Patient Details Name: [name] MRN: [id number] DOB: August 07, 1951   Cancelled Treatment:    Reason Eval/Treat Not Completed: Patient at procedure or test/unavailable (HD).  Ellamae Sia, PT, DPT Acute Rehabilitation Services Pager [phone] Office [phone]    Willy Eddy 11/16/2018, 10:11 AM

## 2018-11-16 NOTE — Discharge Instructions (Signed)
Follow with Primary MD Biagio Borg, MD in 7 days   Get CBC, CMP, 2 view Chest X ray -  checked  by Primary MD  in 5-7 days    Activity: As tolerated with Full fall precautions use walker/cane & assistance as needed  Disposition Home    Diet: Renal with 1.2lit/day fluid restriction.  Special Instructions: If you have smoked or chewed Tobacco  in the last 2 yrs please stop smoking, stop any regular Alcohol  and or any Recreational drug use.  On your next visit with your primary care physician please Get Medicines reviewed and adjusted.  Please request your Prim.MD to go over all Hospital Tests and Procedure/Radiological results at the follow up, please get all Hospital records sent to your Prim MD by signing hospital release before you go home.  If you experience worsening of your admission symptoms, develop shortness of breath, life threatening emergency, suicidal or homicidal thoughts you must seek medical attention immediately by calling 911 or calling your MD immediately  if symptoms less severe.  You Must read complete instructions/literature along with all the possible adverse reactions/side effects for all the Medicines you take and that have been prescribed to you. Take any new Medicines after you have completely understood and accpet all the possible adverse reactions/side effects.

## 2018-11-16 NOTE — Progress Notes (Signed)
Patient and wife given discharge instructions and prescription for vancomycin. All questions answered. Wife states that patient had home health PT from [place] with case management consulted to resume.  PIV x 2 removed, patient dressed himself and gathered all belongings. Patient discharged home with wife via wheel chair.

## 2018-11-16 NOTE — Discharge Summary (Signed)
[name] INO:[id number] DOB: [birth year] DOA: 11/11/2018  PCP: Biagio Borg, MD  Admit date: 11/11/2018  Discharge date: 11/16/2018  Admitted From: Home   Disposition:  Home   Recommendations for Outpatient Follow-up:   Follow up with PCP in 1-2 weeks  PCP Please obtain BMP/CBC, 2 view CXR in 1week,  (see Discharge instructions)   PCP Please follow up on the following pending results: Check CBC CMP next visit   Home Health: None   Equipment/Devices: None  Consultations: ID, Renal Discharge Condition: Stable   CODE STATUS: Full   Diet Recommendation: Renal, 1.2 L/day fluid restriction    Chief Complaint  Patient presents with  . Altered Mental Status  . Fever     Brief history of present illness from the day of admission and additional interim summary    Patient is a 68 y.o. male ESRD, prior history of recurrent Klebsiella bacteremia, recent history of C. difficile colitis DM-2, hypothyroidism, OSA on CPAP presented to the hospital on 2/11 from HD for hypotension, fever,transient abdominal pain, thought to have septic shock with unknown foci-admitted to the ICU with broad-spectrum antimicrobial therapy, and vasopressor support.  Upon clinical stability, transferred to the triad hospitalist service on 2/13.  On further evaluation, patient was found to have intra-/extrahepatic ductal dilatation-underwent MRCP on 2/13 that confirmed choledocholithiasis (likely cause of recurrent bacteremia)-GI was subsequently consulted with plans for ERCP.                                                                 Hospital Course    Septic shock due to cholangitis with choledocholithiasis with history of cholecystectomy in the past:  Also has history of Klebsiella pneumonia bacteremia.   - He underwent CT scan abdomen  pelvis along with ERCP and MRCP.  ERCP was done 11/14/2018 - Sphincterotomy was performed, balloon sweep was performed initially with 9/12 mm balloon, followed by 12/15 mm balloon. Large amount of sludge and stone was retrieved.    His sepsis pathophysiology has completely resolved, he is afebrile and back to baseline, no abdominal tenderness, history of cholecystectomy in the past.  But ID he will get 6 more rounds of IV cefazolin for the next 2 weeks during his HD treatments, dose has been confirmed with pharmacy by me today, also have communicated to nephrology team about the same.  He will be discharged home at this point with IV cefazolin for 2 more weeks.  ESRD on HD TTS: Nephrology following and directing care.  AV malfunction on 2/13-underwent fistulogram with by IR on 2/14.  Underwent HD late on 11/14/2018 and will have another HD on 11/16/2018 and then discharged home.   Transaminitis: Could be from shock liver or passed gall stone-LFTs have markedly improved.  PCP to repeat CMP next visit.History of  CVA: No focal deficits-continue aspirin.  History of depression: Continue Celexa  History of peripheral neuropathy: Continue gabapentin  Hyperthyroidism: Continue Tapazole  Gout: No evidence of flare, continue allopurinol and colchicine.  OSA: CPAP nightly.   C diff Colitis - on PO Vanco, latest C diff -ve.  But ID completed 1 month course of oral vancomycin which will be done.   Discharge diagnosis     Active Problems:   Bacteremia   Septic shock ([place])   Dialysis AV fistula malfunction ([name])   Recurrent Clostridioides difficile diarrhea   Common bile duct stone    Discharge instructions    Discharge Instructions    Discharge instructions   Complete by:  As directed    Follow with Primary MD Biagio Borg, MD in 7 days   Get CBC, CMP, 2 view Chest X ray -  checked  by Primary MD  in 5-7 days    Activity: As tolerated with Full fall precautions use walker/cane &  assistance as needed  Disposition Home    Diet: Renal with 1.2lit/day fluid restriction.  Special Instructions: If you have smoked or chewed Tobacco  in the last 2 yrs please stop smoking, stop any regular Alcohol  and or any Recreational drug use.  On your next visit with your primary care physician please Get Medicines reviewed and adjusted.  Please request your Prim.MD to go over all Hospital Tests and Procedure/Radiological results at the follow up, please get all Hospital records sent to your Prim MD by signing hospital release before you go home.  If you experience worsening of your admission symptoms, develop shortness of breath, life threatening emergency, suicidal or homicidal thoughts you must seek medical attention immediately by calling 911 or calling your MD immediately  if symptoms less severe.  You Must read complete instructions/literature along with all the possible adverse reactions/side effects for all the Medicines you take and that have been prescribed to you. Take any new Medicines after you have completely understood and accpet all the possible adverse reactions/side effects.   Increase activity slowly   Complete by:  As directed       Discharge Medications   Allergies as of 11/16/2018      Reactions   Statins Other (See Comments)   Weak muscles   Ciprofloxacin Rash      Medication List    TAKE these medications   acetaminophen 325 MG tablet Commonly known as:  TYLENOL Take 650 mg by mouth every 6 (six) hours as needed for mild pain or headache.   acetaminophen-codeine 300-30 MG tablet Commonly known as:  TYLENOL #3 Take 1-2 tablets by mouth 4 (four) times daily as needed.   Alirocumab 150 MG/ML Sopn Commonly known as:  PRALUENT Inject 150 mg into the skin every 14 (fourteen) days.   allopurinol 100 MG tablet Commonly known as:  ZYLOPRIM TAKE ONE TABLET BY MOUTH ONCE DAILY   aspirin EC 325 MG tablet Take 1 tablet (325 mg total) by mouth daily.     betamethasone dipropionate 0.05 % cream Commonly known as:  DIPROLENE Apply 1 application topically daily as needed (leg sores).   ceFAZolin 1-4 GM/50ML-% Soln Commonly known as:  ANCEF Inject 100 mLs (2 g total) into the vein every dialysis for up to 14 days (for 2 weeks - next 6 HD runs).   citalopram 10 MG tablet Commonly known as:  CELEXA Take 1 tablet (10 mg total) by mouth at bedtime.   colchicine 0.6 MG tablet  Take 1 tablet (0.6 mg total) by mouth daily.   doxercalciferol 4 MCG/2ML injection Commonly known as:  [name] Inject 1 mL (2 mcg total) into the vein Every Tuesday,Thursday,and Saturday with dialysis.   ezetimibe 10 MG tablet Commonly known as:  ZETIA TAKE ONE TABLET BY MOUTH ONCE DAILY   feeding supplement (NEPRO CARB STEADY) Liqd Take 237 mLs by mouth 2 (two) times daily between meals.   ferric citrate 1 GM 210 MG(Fe) tablet Commonly known as:  AURYXIA Take 2 tablets (420 mg total) by mouth 3 (three) times daily with meals.   gabapentin 100 MG capsule Commonly known as:  NEURONTIN Take 1 capsule (100 mg total) by mouth at bedtime.   hydroxypropyl methylcellulose / hypromellose 2.5 % ophthalmic solution Commonly known as:  ISOPTO TEARS / GONIOVISC Place 1 drop into both eyes 3 (three) times daily as needed for dry eyes.   methimazole 5 MG tablet Commonly known as:  TAPAZOLE Take 0.5 tablets (2.5 mg total) by mouth daily.   multivitamin Tabs tablet Take 1 tablet by mouth daily.   pantoprazole 40 MG tablet Commonly known as:  PROTONIX Take 40 mg by mouth at bedtime.   polyethylene glycol packet Commonly known as:  MIRALAX / GLYCOLAX Take 17 g by mouth daily. What changed:    when to take this  reasons to take this   SENSIPAR 90 MG tablet Generic drug:  cinacalcet Take 90 mg by mouth 2 (two) times daily.   vancomycin 125 MG capsule Commonly known as:  VANCOCIN Take 1 capsule (125 mg total) by mouth 3 (three) times daily for 30 days. What  changed:  when to take this   WELCHOL 625 MG tablet Generic drug:  colesevelam TAKE 3 TABLETS BY MOUTH TWICE DAILY WITH MEALS What changed:    how much to take  how to take this  when to take this  additional instructions   zolpidem 5 MG tablet Commonly known as:  AMBIEN Take 1 tablet (5 mg total) by mouth at bedtime as needed. for sleep What changed:    reasons to take this  additional instructions       Follow-up Information    Biagio Borg, MD. Schedule an appointment as soon as possible for a visit in 1 week(s).   Specialties:  Internal Medicine, Radiology Contact information: [place] [address] [phone]        Constance Haw, MD .   Specialty:  Cardiology Contact information: [address] [place] [address] [place]  [address] [phone]           Major procedures and Radiology Reports - PLEASE review detailed and final reports thoroughly  -       Ct Abdomen Pelvis W Contrast  Result Date: 11/11/2018 CLINICAL DATA:  Generalized abdomen pain with fever EXAM: CT ABDOMEN AND PELVIS WITH CONTRAST TECHNIQUE: Multidetector CT imaging of the abdomen and pelvis was performed using the standard protocol following bolus administration of intravenous contrast. CONTRAST:  149mL OMNIPAQUE IOHEXOL 300 MG/ML  SOLN COMPARISON:  October 04, 2018 FINDINGS: Lower chest: The heart size is enlarged. Mild atelectasis of posterior lung bases are noted. Right lung base nodule 4 mm unchanged. Hepatobiliary: Mild diffuse low density of liver is identified. Patient status post prior cholecystectomy with intrahepatic biliary ductal dilatation and extrahepatic biliary ductal dilatation identified. Pneumobilia is noted. Pancreas: Unremarkable. No pancreatic ductal dilatation or surrounding inflammatory changes. Spleen: Normal in size without focal abnormality. Adrenals/Urinary  Tract: The adrenal glands are normal. Bilateral kidney cysts are identified in  atrophic kidneys bilaterally. Renal vascular calcifications are identified bilaterally. There is no hydronephrosis bilaterally. The bladder is decompressed limiting evaluation. Stomach/Bowel: Stomach is within normal limits. Appendix appears normal. No evidence of bowel wall thickening, distention, or inflammatory changes. Vascular/Lymphatic: Aortic atherosclerosis. No enlarged abdominal or pelvic lymph nodes. Reproductive: Surgical clips are identified in the prostate gland unchanged. Other: None. Musculoskeletal: Degenerative joint changes of the spine are noted. Patient status post prior posterior fusion of lower lumbar spine. IMPRESSION: No acute abnormality identified in the abdomen and pelvis. Fatty infiltration of liver. Status post prior cholecystectomy with intra and extrahepatic biliary ductal dilatation, postsurgical. Atrophic bilateral kidneys. Electronically Signed   By: Abelardo Diesel M.D.   On: 11/11/2018 19:58   Mr Abdomen Mrcp Wo Contrast  Result Date: 11/13/2018 CLINICAL DATA:  Sepsis.  History of cholecystectomy. EXAM: MRI ABDOMEN WITHOUT CONTRAST  (INCLUDING MRCP) TECHNIQUE: Multiplanar multisequence MR imaging of the abdomen was performed. Heavily T2-weighted images of the biliary and pancreatic ducts were obtained, and three-dimensional MRCP images were rendered by post processing. COMPARISON:  CT scan 11/11/2018 FINDINGS: Lower chest: Bibasilar atelectasis. No pleural or pericardial effusions. Hepatobiliary: Intra and extrahepatic biliary dilatation is demonstrated. The common bile duct measures up to 14 mm. There is an 8 mm distal common bile duct stone along with a few other smaller stones layering dependently. Small calculi are also noted in the cystic duct remnant. There is also non dependent pneumobilia noted. Pancreas:  No mass, inflammation or ductal dilatation. Spleen:  Normal size.  No focal lesions. Adrenals/Urinary Tract: The adrenal glands are unremarkable and stable. The  kidneys are small and there are numerous cysts, typical findings related to dialysis. Stomach/Bowel: Visualized portions within the abdomen are unremarkable. Vascular/Lymphatic: No pathologically enlarged lymph nodes identified. No abdominal aortic aneurysm demonstrated. Other:  No ascites or abdominal wall hernia. Musculoskeletal: No significant bony findings. Spinal fusion hardware is noted. IMPRESSION: 1. 8 mm distal common bile duct stones along with a few other small common bile duct stones. I suspect there also small stones in the cystic duct remnant. There is also pneumobilia with some non dependent air bubbles in the biliary tree. 2. Unremarkable MR appearance of the pancreas. 3. Small/atrophied kidneys with numerous cysts. Electronically Signed   By: Marijo Sanes M.D.   On: 11/13/2018 20:53   Mr 3d Recon At Scanner  Result Date: 11/13/2018 CLINICAL DATA:  Sepsis.  History of cholecystectomy. EXAM: MRI ABDOMEN WITHOUT CONTRAST  (INCLUDING MRCP) TECHNIQUE: Multiplanar multisequence MR imaging of the abdomen was performed. Heavily T2-weighted images of the biliary and pancreatic ducts were obtained, and three-dimensional MRCP images were rendered by post processing. COMPARISON:  CT scan 11/11/2018 FINDINGS: Lower chest: Bibasilar atelectasis. No pleural or pericardial effusions. Hepatobiliary: Intra and extrahepatic biliary dilatation is demonstrated. The common bile duct measures up to 14 mm. There is an 8 mm distal common bile duct stone along with a few other smaller stones layering dependently. Small calculi are also noted in the cystic duct remnant. There is also non dependent pneumobilia noted. Pancreas:  No mass, inflammation or ductal dilatation. Spleen:  Normal size.  No focal lesions. Adrenals/Urinary Tract: The adrenal glands are unremarkable and stable. The kidneys are small and there are numerous cysts, typical findings related to dialysis. Stomach/Bowel: Visualized portions within the  abdomen are unremarkable. Vascular/Lymphatic: No pathologically enlarged lymph nodes identified. No abdominal aortic aneurysm demonstrated. Other:  No ascites or  abdominal wall hernia. Musculoskeletal: No significant bony findings. Spinal fusion hardware is noted. IMPRESSION: 1. 8 mm distal common bile duct stones along with a few other small common bile duct stones. I suspect there also small stones in the cystic duct remnant. There is also pneumobilia with some non dependent air bubbles in the biliary tree. 2. Unremarkable MR appearance of the pancreas. 3. Small/atrophied kidneys with numerous cysts. Electronically Signed   By: Marijo Sanes M.D.   On: 11/13/2018 20:53   Ir US Guide Vasc Access Right  Result Date: 11/14/2018 INDICATION: 68 year old male with a history of poor flow of right upper extremity fistula during dialysis EXAM: ULTRASOUND-GUIDED ACCESS RIGHT UPPER EXTREMITY FISTULA ANGIOPLASTY VENOUS OUTFLOW STENOSIS MEDICATIONS: 1.5 mg Versed, 75 mcg fentanyl ANESTHESIA/SEDATION: Moderate Sedation Time:  29 minutes The patient was continuously monitored during the procedure by the interventional radiology nurse under my direct supervision. FLUOROSCOPY TIME:  Fluoroscopy Time: 3 minutes 54 seconds (32 mGy). COMPLICATIONS: None PROCEDURE: Informed written consent was obtained from the patient after a thorough discussion of the procedural risks, benefits and alternatives. All questions were addressed. Maximal Sterile Barrier Technique was utilized including caps, mask, sterile gowns, sterile gloves, sterile drape, hand hygiene and skin antiseptic. A timeout was performed prior to the initiation of the procedure. Patient's right arm was prepped and draped in the usual sterile fashion, 90 degree abduction. Ultrasound images were stored sent to PACs. 1% lidocaine was used for local anesthesia. Ultrasound guidance was used to puncture the fistula directed centrally. With the micro puncture in place, images  were performed of the upper extremity including reflux images to the arteriovenous connection. Given that the narrowing was identified just beyond the AV connection, the access was reversed towards the AV connection. This was done using fluoroscopy and Glidewire. Six French sheath was then placed over the Glidewire. Balloon angioplasty was then performed with 4 mm x 40 mm balloon and then 5 mm x 40 mm balloon. Less than 30% residual was identified at the completion. Repeat images were performed. Excellent thrill was confirmed. All wires catheters balloons and the sheath were removed placing a stay suture. Patient tolerated the procedure well and remained hemodynamically stable throughout. No complications were encountered and no significant blood loss. IMPRESSION: Status post ultrasound-guided access of the right upper extremity fistula for fistulagram and balloon angioplasty of proximal venous outflow stenosis, with less than 30% residual at the completion. Excellent thrill confirmed upon completion. Signed, Dulcy Fanny. Dellia Nims, RPVI [facility] Radiology ACCESS: This access remains amenable to future percutaneous interventions as clinically indicated. Electronically Signed   By: Corrie Mckusick D.O.   On: 11/14/2018 12:01   Dg Chest Port 1 View  Result Date: 11/11/2018 CLINICAL DATA:  Fever and altered mental status EXAM: PORTABLE CHEST 1 VIEW COMPARISON:  October 03, 2018 FINDINGS: The heart size and mediastinal contours are stable. The aorta is tortuous. There is mild central pulmonary vascular congestion. There is no frank pulmonary edema, focal pneumonia or pleural effusion. The visualized skeletal structures are stable. IMPRESSION: Mild central pulmonary vascular congestion. Electronically Signed   By: Abelardo Diesel M.D.   On: 11/11/2018 16:08   Dg Ercp  Result Date: 11/14/2018 CLINICAL DATA:  68 year old male with choledocholithiasis EXAM: ERCP TECHNIQUE:  Multiple spot images obtained with the fluoroscopic device and submitted for interpretation post-procedure. FLUOROSCOPY TIME:  Fluoroscopy Time:  5 minutes 28 seconds reported COMPARISON:  MRCP 11/13/2018 FINDINGS: A total of 4 intraoperative spot images  are submitted for review. The images demonstrate a flexible endoscope in the descending duodenum with wire cannulation of the common bile duct. Subsequent cholangiography demonstrates intra and extrahepatic biliary ductal dilatation. Subsequent images show sphincterotomy and balloon sweep of the common duct. IMPRESSION: ERCP as above. These images were submitted for radiologic interpretation only. Please see the procedural report for the amount of contrast and the fluoroscopy time utilized. Electronically Signed   By: Jacqulynn Cadet M.D.   On: 11/14/2018 15:46   Ir Av Dialy Shunt Intro Needle/intracath Initial W/pta/img Right  Result Date: 11/14/2018 INDICATION: 68 year old male with a history of poor flow of right upper extremity fistula during dialysis EXAM: ULTRASOUND-GUIDED ACCESS RIGHT UPPER EXTREMITY FISTULA ANGIOPLASTY VENOUS OUTFLOW STENOSIS MEDICATIONS: 1.5 mg Versed, 75 mcg fentanyl ANESTHESIA/SEDATION: Moderate Sedation Time:  29 minutes The patient was continuously monitored during the procedure by the interventional radiology nurse under my direct supervision. FLUOROSCOPY TIME:  Fluoroscopy Time: 3 minutes 54 seconds (32 mGy). COMPLICATIONS: None PROCEDURE: Informed written consent was obtained from the patient after a thorough discussion of the procedural risks, benefits and alternatives. All questions were addressed. Maximal Sterile Barrier Technique was utilized including caps, mask, sterile gowns, sterile gloves, sterile drape, hand hygiene and skin antiseptic. A timeout was performed prior to the initiation of the procedure. Patient's right arm was prepped and draped in the usual sterile fashion, 90 degree abduction. Ultrasound images were  stored sent to PACs. 1% lidocaine was used for local anesthesia. Ultrasound guidance was used to puncture the fistula directed centrally. With the micro puncture in place, images were performed of the upper extremity including reflux images to the arteriovenous connection. Given that the narrowing was identified just beyond the AV connection, the access was reversed towards the AV connection. This was done using fluoroscopy and Glidewire. Six French sheath was then placed over the Glidewire. Balloon angioplasty was then performed with 4 mm x 40 mm balloon and then 5 mm x 40 mm balloon. Less than 30% residual was identified at the completion. Repeat images were performed. Excellent thrill was confirmed. All wires catheters balloons and the sheath were removed placing a stay suture. Patient tolerated the procedure well and remained hemodynamically stable throughout. No complications were encountered and no significant blood loss. IMPRESSION: Status post ultrasound-guided access of the right upper extremity fistula for fistulagram and balloon angioplasty of proximal venous outflow stenosis, with less than 30% residual at the completion. Excellent thrill confirmed upon completion. Signed, Dulcy Fanny. Dellia Nims, RPVI [facility]iology [facility] Radiology ACCESS: This access remains amenable to future percutaneous interventions as clinically indicated. Electronically Signed   By: Corrie Mckusick D.O.   On: 11/14/2018 12:01    Micro Results     Recent Results (from the past 240 hour(s))  MRSA PCR Screening     Status: None   Collection Time: 11/11/18 10:12 PM  Result Value Ref Range Status   MRSA by PCR NEGATIVE NEGATIVE Final    Comment:        The GeneXpert MRSA Assay (FDA approved for NASAL specimens only), is one component of a comprehensive MRSA colonization surveillance program. It is not intended to diagnose MRSA infection nor to guide or monitor treatment for MRSA  infections. Performed at [facility], [place] [address] [place], [place] [address]   Blood Culture (routine x 2)     Status: None (Preliminary result)   Collection Time: 11/11/18 10:50 PM  Result Value Ref Range Status  Specimen Description BLOOD LEFT HAND  Final   Special Requests   Final    BOTTLES DRAWN AEROBIC ONLY Blood Culture results may not be optimal due to an inadequate volume of blood received in culture bottles   Culture   Final    NO GROWTH 4 DAYS Performed at [facility], [place] [address] [place], [place] [address]    Report Status PENDING  Incomplete  Blood Culture (routine x 2)     Status: None (Preliminary result)   Collection Time: 11/11/18 11:10 PM  Result Value Ref Range Status   Specimen Description BLOOD LEFT WRIST  Final   Special Requests   Final    BOTTLES DRAWN AEROBIC ONLY Blood Culture results may not be optimal due to an inadequate volume of blood received in culture bottles   Culture   Final    NO GROWTH 4 DAYS Performed at [facility], [place] [address] [place], [place] [address]    Report Status PENDING  Incomplete  C difficile quick scan w PCR reflex     Status: None   Collection Time: 11/12/18  4:51 PM  Result Value Ref Range Status   C Diff antigen NEGATIVE NEGATIVE Final   C Diff toxin NEGATIVE NEGATIVE Final   C Diff interpretation No C. difficile detected.  Final    Comment: Performed at [facility], [place] [name] [place], [place] [address]    Today   Subjective    [name] today has no headache,no chest abdominal pain,no new weakness tingling or numbness, feels much better     Objective   Blood pressure (!) 150/61, pulse (!) 59, temperature 98.1 F (36.7 C), temperature source Oral, resp. rate 17, height 6\' 1"  (1.854 m), weight 103.1 kg, SpO2 99 %.   Intake/Output Summary (Last 24 hours) at 11/16/2018 0939 Last data filed at 11/16/2018 0407 Gross per 24 hour  Intake 50.25 ml  Output -  Net 50.25 ml      Exam  Awake Alert, Oriented x 3, No new F.N deficits, Normal affect [place].AT,PERRAL Supple Neck,No JVD, No cervical lymphadenopathy appriciated.  Symmetrical Chest wall movement, Good air movement bilaterally, CTAB RRR,No Gallops,Rubs or new Murmurs, No Parasternal Heave +ve B.Sounds, Abd Soft, Non tender, No organomegaly appriciated, No rebound -guarding or rigidity. No Cyanosis, Clubbing or edema, No new Rash or bruise   Data Review   CBC w Diff:  Lab Results  Component Value Date   WBC 7.2 11/16/2018   HGB 10.7 (L) 11/16/2018   HGB 11.7 (L) 11/20/2016   HCT 34.6 (L) 11/16/2018   HCT 35.6 (L) 11/20/2016   PLT 208 11/16/2018   PLT 304 11/20/2016   LYMPHOPCT 4 11/11/2018   BANDSPCT 0 08/19/2018   MONOPCT 4 11/11/2018   EOSPCT 0 11/11/2018   BASOPCT 0 11/11/2018    CMP:  Lab Results  Component Value Date   NA 138 11/16/2018   NA 143 11/20/2016   K 4.2 11/16/2018   CL 102 11/16/2018   CO2 25 11/16/2018   BUN 59 (H) 11/16/2018   BUN 59 (H) 11/20/2016   CREATININE 11.25 (H) 11/16/2018   PROT 6.3 (L) 11/15/2018   ALBUMIN 2.6 (L) 11/16/2018   BILITOT 1.1 11/15/2018   ALKPHOS 120 11/15/2018   AST 24 11/15/2018   ALT 63 (H) 11/15/2018  .   Total Time in preparing paper work, data evaluation and todays exam - 67 minutes  Lala Lund M.D on 11/16/2018  at 9:39 AM  [facility]   Office  [phone]

## 2018-11-16 NOTE — Care Management (Signed)
Patient active w [facility] for [facility] services. Added resumption orders and notified weekend liaison.

## 2018-11-16 NOTE — Progress Notes (Addendum)
[facility] Progress Note   Subjective:  Seen in HD unit. UF goal 5L to start but starting to have chest discomfort so goal lowered 4L. CTM No SOB, Abd pain, N/V.   Objective Vitals:   11/16/18 0730 11/16/18 0739 11/16/18 0800 11/16/18 0830  BP: 130/70 130/70 140/70 (!) 142/83  Pulse: 62 60 64 65  Resp: 18 18 16 17   Temp: 98.1 F (36.7 C)     TempSrc: Oral     SpO2:  99%    Weight: 103.1 kg     Height:        Physical Exam General: WNWD male, alert NAD  Heart: RRR  Lungs: CTA anteriorly  Abdomen: soft NTND Extremities: No LE edema  Dialysis Access: RUA AVF in use on HD   Wt Readings from Last 3 Encounters:  11/16/18 103.1 kg  10/17/18 97.5 kg  10/15/18 97.5 kg   Weight change: -2.1 kg   Additional Objective Labs: Basic Metabolic Panel: Recent Labs  Lab 11/14/18 0539 11/15/18 0345 11/16/18 0424  NA 141 139 138  K 5.1 4.3 4.2  CL 107 101 102  CO2 19* 23 25  GLUCOSE 97 134* 121*  BUN 72* 42* 59*  CREATININE 13.15* 9.29* 11.25*  CALCIUM 7.9* 7.7* 7.4*  PHOS 7.1* 6.8* 6.7*   CBC: Recent Labs  Lab 11/11/18 1701 11/12/18 0309 11/13/18 0357 11/14/18 0539 11/15/18 0345 11/16/18 0424  WBC 8.2 18.7* 12.5* 8.5 6.1 7.2  NEUTROABS 7.5  --   --   --   --   --   HGB 13.1 11.4* 10.6* 11.0* 11.0* 10.7*  HCT 43.0 37.4* 34.1* 35.6* 33.8* 34.6*  MCV 98.9 96.4 96.3 94.2 92.6 94.3  PLT 233 216 189 198 198 208   Blood Culture    Component Value Date/Time   SDES BLOOD LEFT WRIST 11/11/2018 2310   SPECREQUEST  11/11/2018 2310    BOTTLES DRAWN AEROBIC ONLY Blood Culture results may not be optimal due to an inadequate volume of blood received in culture bottles   CULT  11/11/2018 2310    NO GROWTH 4 DAYS Performed at [facility], [address] [place], [place] [address]    REPTSTATUS PENDING 11/11/2018 2310     Medications: . sodium chloride Stopped (11/14/18 2348)  .  ceFAZolin (ANCEF) IV Stopped (11/15/18 2241)   . allopurinol   100 mg Oral Daily  . aspirin EC  325 mg Oral Daily  . Chlorhexidine Gluconate Cloth  6 each Topical Q0600  . Chlorhexidine Gluconate Cloth  6 each Topical Q0600  . cinacalcet  90 mg Oral BID WC  . citalopram  10 mg Oral QHS  . colchicine  0.6 mg Oral Daily  . colesevelam  1,875 mg Oral BID WC  . doxercalciferol  2 mcg Intravenous Q T,Th,Sa-HD  . ezetimibe  10 mg Oral Daily  . feeding supplement (NEPRO CARB STEADY)  237 mL Oral BID BM  . ferric citrate  420 mg Oral TID WC  . gabapentin  300 mg Oral QHS  . hydrocortisone sod succinate (SOLU-CORTEF) inj  25 mg Intravenous Daily  . insulin aspart  0-9 Units Subcutaneous TID WC  . methimazole  2.5 mg Oral Daily  . midodrine  5 mg Oral TID WC  . multivitamin  1 tablet Oral QHS  . pantoprazole  40 mg Oral Daily  . vancomycin  125 mg Oral Q12H    Home meds: - alirocumab 150mg  every 2 wks - allopurinol  100 qd/ colchicine 0.6 qd/ ferric citrate 420mg  tid ac - pantoprazole 40 hs/ methimazole 2.5 mg qd/ sensipar 90 qd - welchol 1875mg  bid ac/ ezetimibe 10 qd/ aspirin 325 qd - citalopram 10 qd/ zolpidem 5mg  hs prn - po vancomycin 125mg  qid  Dialysis Orders:   [facility] TTS 4h 97.5kg 2/2.25 bath P2 Hep none RUA AVF - hect 2 ug - no esa  Assessment/Plan: 1. Cholangitis/CBD stones - s/p ERCP 2/14 with sphincterectomy and removal of large CBD stone and stones/sluge  2. Sepsis w hx of recurrent Klebsiella bacteremia. Blood cultures have been negative this admission. Felt by ID to be secondary to #1. Per ID will need 10 day course of Ancef (from 2/15) and surveillance blood cultures 2 weeks post antibiotics  3. AVF malfunction - s/p IR fistulogram 2/14 with intervention. Recent CKV intervention as well.  4. ESRD - Usually TTS. HD off schedule today d/t procedures.  5. HTN/volume- BP ok. On midodrine for hypotension. 5.5kg over EDW -UF to EDW as tolerated. 6. Anemia-  Hgb 10.7. Not on ESA. Follow trends  7. Metabolic Bone  Disease- Corr Ca ok/Phos elevated. Continue [name]/binders  8. Nutrition - Prot supp for low albumin  9. Recurrent C. Diff colitis -Negative this admission    Lynnda Child PA-C [facility] Pager [phone] 11/16/2018,8:35 AM  LOS: 5 days   Pt seen, examined and agree w A/P as above.  [facility] 11/16/2018, 12:42 PM

## 2018-11-17 ENCOUNTER — Telehealth: Payer: Self-pay | Admitting: *Deleted

## 2018-11-17 ENCOUNTER — Inpatient Hospital Stay: Payer: Medicare Other | Admitting: Adult Health

## 2018-11-17 LAB — CULTURE, BLOOD (ROUTINE X 2)
Culture: NO GROWTH
Culture: NO GROWTH

## 2018-11-17 LAB — GLUCOSE, CAPILLARY: Glucose-Capillary: 130 mg/dL — ABNORMAL HIGH (ref 70–99)

## 2018-11-17 NOTE — Telephone Encounter (Signed)
Transition Care Management Follow-up Telephone Call   Date discharged? 11/16/18   How have you been since you were released from the hospital? Spoke w/wife she states pt is doing alright   Do you understand why you were in the hospital? YES   Do you understand the discharge instructions? YES   Where were you discharged to? Home   Items Reviewed:  Medications reviewed: YES  Allergies reviewed: YES  Dietary changes reviewed: YES, wife states he's on a fluid restriction  Referrals reviewed: No referral recommended   Functional Questionnaire:   Activities of Daily Living (ADLs):   She states he are independent in the following: feeding, continence, grooming and toileting States they require assistance with the following: ambulation, bathing and hygiene and dressing   Any transportation issues/concerns?: NO   Any patient concerns? NO   Confirmed importance and date/time of follow-up visits scheduled YES, appt 11/26/18  Provider Appointment booked with Dr. [name]  Confirmed with patient if condition begins to worsen call PCP or go to the ER.  Patient was given the office number and encouraged to call back with question or concerns.  :  YES

## 2018-11-18 ENCOUNTER — Other Ambulatory Visit: Payer: Self-pay | Admitting: *Deleted

## 2018-11-18 ENCOUNTER — Telehealth: Payer: Self-pay

## 2018-11-18 DIAGNOSIS — N2581 Secondary hyperparathyroidism of renal origin: Secondary | ICD-10-CM | POA: Diagnosis not present

## 2018-11-18 DIAGNOSIS — D509 Iron deficiency anemia, unspecified: Secondary | ICD-10-CM | POA: Diagnosis not present

## 2018-11-18 DIAGNOSIS — K8309 Other cholangitis: Secondary | ICD-10-CM | POA: Diagnosis not present

## 2018-11-18 DIAGNOSIS — D631 Anemia in chronic kidney disease: Secondary | ICD-10-CM | POA: Diagnosis not present

## 2018-11-18 DIAGNOSIS — E119 Type 2 diabetes mellitus without complications: Secondary | ICD-10-CM | POA: Diagnosis not present

## 2018-11-18 DIAGNOSIS — N186 End stage renal disease: Secondary | ICD-10-CM | POA: Diagnosis not present

## 2018-11-18 NOTE — Telephone Encounter (Signed)
Noted  Copied from [place] [id number]. Topic: General - Other >> Nov 18, 2018  9:49 AM Alfredia Ferguson R wrote: Tonya from [facility] called and stated they will resume care with patient on 02/21

## 2018-11-18 NOTE — Patient Outreach (Signed)
[name] [facility]) Care Management  11/18/2018  [name] [birth year] [id number]   Noted member was admitted to hospital 2/11-2/16 with altered mental status and sepsis.  Call placed to member's wife to follow up on discharge.  She state she does not have much time to talk as she has been very busy trying to manage his health as well as her own and their son's.  Member has had multiple MD visits along with dialysis 3 days a week.  State member has 3 appointments tomorrow (neurology, rheumatology, and endocrinology) but report she will cancel at least 1 because "it's just too much at one time." He remains active with [facility] for nursing and PT.    This care manager inquires about wife's coping with management of member's condition.  She again report member is not willing to have anyone else come in to provide additional support.  She admits that she is experiencing caregiver burnout but does not want to research options for support at this time.  She is aware that member's condition continues to decline and that she will eventually need additional help in the home.  She denies any urgent needs at this time stating "I just do the best I can."  Advised to notify this care manager if she changed her mind regarding support in the home.    Will follow up with member/wife within the next 2 weeks.  [facility] CM Care Plan Problem One     Most Recent Value  Care Plan Problem One  Patient at risk for hospitalization related to sepsis as evidenced by recent hospitalization  Role Documenting the Problem One  Care Management [place] for Problem One  Active  [facility] Long Term Goal   Patient will not have a hospital admission within the next 31 days  [facility] Long Term Goal Start Date  10/10/18  [facility] Long Term Goal Met Date  11/11/18  [facility] CM Short Term Goal #1   Member will keep and attend all follow up appointments within the next 2 weeks  [facility] CM Short Term Goal #1 Start Date  10/10/18   [facility] CM Short Term Goal #1 Met Date  11/18/18  [facility] CM Short Term Goal #2   Member will report home health restarted within the next week  [facility] CM Short Term Goal #2 Start Date  10/10/18  [facility] CM Short Term Goal #3  Member will free from infection over the next 30 days  [facility] CM Short Term Goal #3 Start Date  10/10/18  [facility] CM Short Term Goal #3 Met Date  -- [Not met, date reset]      [name], RN, MSN [place] Manager [phone]

## 2018-11-19 ENCOUNTER — Ambulatory Visit (INDEPENDENT_AMBULATORY_CARE_PROVIDER_SITE_OTHER): Payer: Medicare Other | Admitting: Adult Health

## 2018-11-19 ENCOUNTER — Encounter: Payer: Self-pay | Admitting: Endocrinology

## 2018-11-19 ENCOUNTER — Ambulatory Visit (INDEPENDENT_AMBULATORY_CARE_PROVIDER_SITE_OTHER): Payer: Medicare Other | Admitting: Endocrinology

## 2018-11-19 ENCOUNTER — Encounter: Payer: Self-pay | Admitting: Adult Health

## 2018-11-19 VITALS — BP 104/61 | HR 91 | Ht 73.0 in | Wt 217.4 lb

## 2018-11-19 VITALS — BP 110/60 | HR 97 | Ht 73.0 in | Wt 218.4 lb

## 2018-11-19 DIAGNOSIS — E785 Hyperlipidemia, unspecified: Secondary | ICD-10-CM | POA: Diagnosis not present

## 2018-11-19 DIAGNOSIS — E1142 Type 2 diabetes mellitus with diabetic polyneuropathy: Secondary | ICD-10-CM | POA: Diagnosis not present

## 2018-11-19 DIAGNOSIS — E1121 Type 2 diabetes mellitus with diabetic nephropathy: Secondary | ICD-10-CM | POA: Diagnosis not present

## 2018-11-19 DIAGNOSIS — I639 Cerebral infarction, unspecified: Secondary | ICD-10-CM

## 2018-11-19 DIAGNOSIS — I10 Essential (primary) hypertension: Secondary | ICD-10-CM | POA: Diagnosis not present

## 2018-11-19 DIAGNOSIS — G4733 Obstructive sleep apnea (adult) (pediatric): Secondary | ICD-10-CM | POA: Diagnosis not present

## 2018-11-19 LAB — POCT GLYCOSYLATED HEMOGLOBIN (HGB A1C): Hemoglobin A1C: 6.9 % — AB (ref 4.0–5.6)

## 2018-11-19 NOTE — Progress Notes (Signed)
Subjective:    Patient ID: [name], male    DOB: 02-24-1951, 67 y.o.   MRN: [id number]  HPI Pt has diabetes mellitus:  DM type: 2 Dx'ed: 8413 Complications: polyneuropathy, renal failure, CVA, and CAD.  Therapy: welchol DKA: never.  Severe hypoglycemia: never.  Pancreatitis: never.  Other: renal failure limits rx options; DM med needs have decreased with worsening of renal function.   Interval history: pt states cbg's are well-controlled.  There is no trend throughout the day.  He denies hypoglycemia.   Pt also has hyperthyroidism (due to a multinodular goiter; dx'ed 2009; bx then was benign; nuc med scan showed very low uptake, so tapazole was chosen as rx).  He takes tapazole as rx'ed.   Past Medical History:  Diagnosis Date  . Acute encephalopathy 10/04/2018  . Allergic rhinitis, cause unspecified 02/24/2014  . Anemia 06/16/2011  . BENIGN PROSTATIC HYPERTROPHY 10/14/2009  . CAD, NATIVE VESSEL 02/06/2009   a. 06/2007 s/p Taxus DES to the RCA;  b. 08/2016 NSTEMI in setting of SVT/PCI: LM 30ost, LAD 81m (3.0x16 Synergy DES), LCX 71m, OM1 60, RI 40, RCA 70p/m, 58m - not amenable to PCI.  [name] Cervical radiculopathy, chronic 02/23/2016   Right c5-6 by NCS/EMG  . Chest pain 08/09/2015  . Chronic combined systolic (congestive) and diastolic (congestive) heart failure ([place])    a. 10/2016 Echo: EF 40-45%, Gr2 DD. mildly dil LA.  [name] COLONIC POLYPS, HX OF 10/14/2009  . Dementia ([place]) [id number]  . Depression 09/24/2015  . DIABETES MELLITUS, TYPE II 02/01/2010  . DYSLIPIDEMIA 06/18/2007  . ESRD (end stage renal disease) on dialysis ([place]) 08/04/2010   ESRD due to DM adn HTN, started HD in 2011. As of 2019 has a R arm graft and gets HD on TTS sched  . FOOT PAIN 08/12/2008  . GAIT DISTURBANCE 03/03/2010  . GASTROENTERITIS, VIRAL 10/14/2009  . GERD 06/18/2007  . GOITER, MULTINODULAR 12/26/2007  . GOUT 06/18/2007  . GYNECOMASTIA 07/17/2010  . Hemodialysis access, fistula mature [facility])    Dialysis  [facility] ([place]) Right upper arm fistula  . Hyperlipidemia 10/16/2011  . Hyperparathyroidism, secondary ([place]) 06/16/2011  . HYPERTENSION 06/18/2007  . Hyperthyroidism   . Hypocalcemia 06/07/2010  . Ischemic cardiomyopathy    a. 10/2016 Echo: EF 40-45%.  . Lumbar stenosis with neurogenic claudication   . ONYCHOMYCOSIS, TOENAILS 12/26/2007  . OSA on CPAP 10/16/2011  . Other malaise and fatigue 11/24/2009  . PERIPHERAL NEUROPATHY 06/18/2007  . Prostate cancer ([place])   . PSVT (paroxysmal supraventricular tachycardia) ([place])    a. 53/6644 complicated by NSTEMI;  b. 11/2016 Treated w/ adenosine in ED;  c. 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  [name] PULMONARY NODULE, RIGHT LOWER LOBE 06/08/2009  . Sleep apnea    cpap machine and o2  . TRANSAMINASES, SERUM, ELEVATED 02/01/2010  . Transfusion history    none recent  . Unspecified hypotension 01/30/2010    Past Surgical History:  Procedure Laterality Date  . A/V SHUNTOGRAM N/A 04/07/2018   Procedure: A/V SHUNTOGRAM;  Surgeon: Waynetta Sandy, MD;  Location: [place] CV LAB;  Service: Cardiovascular;  Laterality: N/A;  . ARTERIOVENOUS GRAFT PLACEMENT Right 2009   forearm/notes 02/01/2011  . AV FISTULA PLACEMENT  11/07/2011   Procedure: INSERTION OF ARTERIOVENOUS (AV) GORE-TEX GRAFT ARM;  Surgeon: Tinnie Gens, MD;  Location: [place];  Service: Vascular;  Laterality: Left;  . BACK SURGERY  1998  . BALLOON DILATION N/A 11/14/2018   Procedure: BALLOON DILATION;  Surgeon: Ronnette Juniper, MD;  Location: [place];  Service: Gastroenterology;  Laterality: N/A;  . BASCILIC VEIN TRANSPOSITION Right 02/27/2013   Procedure: BASCILIC VEIN TRANSPOSITION;  Surgeon: Mal Misty, MD;  Location: [place];  Service: Vascular;  Laterality: Right;  Right Basilic Vein Transposition   . CARDIAC CATHETERIZATION N/A 08/06/2016   Procedure: Left Heart Cath and Coronary Angiography;  Surgeon: Jolaine Artist, MD;  Location: [place] CV LAB;  Service: Cardiovascular;   Laterality: N/A;  . CARDIAC CATHETERIZATION N/A 08/07/2016   Procedure: Coronary/Graft Atherectomy-CSI LAD;  Surgeon: Peter M Martinique, MD;  Location: [place] [facility];  Service: Cardiovascular;  Laterality: N/A;  . CERVICAL SPINE SURGERY  2/09   "to repair nerve problems in my left arm"  . CHOLECYSTECTOMY    . COLONOSCOPY WITH PROPOFOL N/A 04/26/2017   Procedure: COLONOSCOPY WITH PROPOFOL;  Surgeon: Otis Brace, MD;  Location: [place];  Service: Gastroenterology;  Laterality: N/A;  . CORONARY ANGIOPLASTY WITH STENT PLACEMENT  06/11/2008  . CORONARY ANGIOPLASTY WITH STENT PLACEMENT  06/2007   TAXUS stent to RCA/notes 01/31/2011  . ERCP N/A 11/14/2018   Procedure: ENDOSCOPIC RETROGRADE CHOLANGIOPANCREATOGRAPHY (ERCP);  Surgeon: Ronnette Juniper, MD;  Location: [place];  Service: Gastroenterology;  Laterality: N/A;  . ESOPHAGOGASTRODUODENOSCOPY  09/28/2011   Procedure: ESOPHAGOGASTRODUODENOSCOPY (EGD);  Surgeon: Missy Sabins, MD;  Location: [facility] ENDOSCOPY;  Service: Endoscopy;  Laterality: N/A;  . ESOPHAGOGASTRODUODENOSCOPY N/A 04/07/2015   Procedure: ESOPHAGOGASTRODUODENOSCOPY (EGD);  Surgeon: Teena Irani, MD;  Location: [facility];  Service: Endoscopy;  Laterality: N/A;  . ESOPHAGOGASTRODUODENOSCOPY N/A 04/19/2015   Procedure: ESOPHAGOGASTRODUODENOSCOPY (EGD);  Surgeon: Arta Silence, MD;  Location: [facility] ENDOSCOPY;  Service: Endoscopy;  Laterality: N/A;  . ESOPHAGOGASTRODUODENOSCOPY  11/14/2018   Procedure: ESOPHAGOGASTRODUODENOSCOPY (EGD);  Surgeon: Ronnette Juniper, MD;  Location: [facility] ENDOSCOPY;  Service: Gastroenterology;;  . ESOPHAGOGASTRODUODENOSCOPY (EGD) WITH PROPOFOL N/A 06/13/2018   Procedure: ESOPHAGOGASTRODUODENOSCOPY (EGD) WITH PROPOFOL;  Surgeon: Wonda Horner, MD;  Location: [facility] ENDOSCOPY;  Service: Endoscopy;  Laterality: N/A;  . FLEXIBLE SIGMOIDOSCOPY N/A 05/21/2017   Procedure: Conni Elliot;  Surgeon: Clarene Essex, MD;  Location: [place];  Service: Endoscopy;   Laterality: N/A;  . FLEXIBLE SIGMOIDOSCOPY Left 07/02/2017   Procedure: FLEXIBLE SIGMOIDOSCOPY;  Surgeon: Laurence Spates, MD;  Location: [place];  Service: Endoscopy;  Laterality: Left;  . FOREIGN BODY REMOVAL  09/2003   via upper endoscopy/notes 02/12/2011  . GIVENS CAPSULE STUDY  09/30/2011   Procedure: GIVENS CAPSULE STUDY;  Surgeon: Jeryl Columbia, MD;  Location: [facility] ENDOSCOPY;  Service: Endoscopy;  Laterality: N/A;  . INSERTION OF DIALYSIS CATHETER Right 2014  . INSERTION OF DIALYSIS CATHETER Left 02/11/2013   Procedure: INSERTION OF DIALYSIS CATHETER;  Surgeon: Conrad , MD;  Location: [place];  Service: Vascular;  Laterality: Left;  Ultrasound guided  . IR AV DIALY SHUNT INTRO NEEDLE/INTRACATH INITIAL W/PTA/IMG RIGHT Right 11/14/2018  . IR US GUIDE VASC ACCESS RIGHT  11/14/2018  . LUMBAR LAMINECTOMY/DECOMPRESSION MICRODISCECTOMY Bilateral 07/31/2017   Procedure: LAMINECTOMY AND FORAMINOTOMY- BILATERAL LUMBAR TWO- LUMBAR THREE;  Surgeon: Earnie Larsson, MD;  Location: [place];  Service: Neurosurgery;  Laterality: Bilateral;  LAMINECTOMY AND FORAMINOTOMY- BILATERAL LUMBAR 2- LUMBAR 3  . PERIPHERAL VASCULAR BALLOON ANGIOPLASTY  04/07/2018   Procedure: PERIPHERAL VASCULAR BALLOON ANGIOPLASTY;  Surgeon: Waynetta Sandy, MD;  Location: [place] CV LAB;  Service: Cardiovascular;;  RUE AVF  . REMOVAL OF A DIALYSIS CATHETER Right 02/11/2013   Procedure: REMOVAL OF A DIALYSIS CATHETER;  Surgeon: Jannette Fogo  Bridgett Larsson, MD;  Location: [place];  Service: Vascular;  Laterality: Right;  . REMOVAL OF STONES  11/14/2018   Procedure: REMOVAL OF STONES;  Surgeon: Ronnette Juniper, MD;  Location: [place];  Service: Gastroenterology;;  . Azzie Almas DILATION N/A 04/07/2015   Procedure: Azzie Almas DILATION;  Surgeon: Teena Irani, MD;  Location: [facility] ENDOSCOPY;  Service: Endoscopy;  Laterality: N/A;  . SHUNTOGRAM N/A 09/20/2011   Procedure: Earney Mallet;  Surgeon: Conrad Bayou Corne, MD;  Location: [facility] CATH LAB;  Service: Cardiovascular;   Laterality: N/A;  . SPHINCTEROTOMY  11/14/2018   Procedure: SPHINCTEROTOMY;  Surgeon: Ronnette Juniper, MD;  Location: [facility] ENDOSCOPY;  Service: Gastroenterology;;  . SVT ABLATION N/A 11/26/2016   Procedure: SVT Ablation;  Surgeon: Will Meredith Leeds, MD;  Location: [place] [facility];  Service: Cardiovascular;  Laterality: N/A;  . TEE WITHOUT CARDIOVERSION N/A 08/25/2018   Procedure: TRANSESOPHAGEAL ECHOCARDIOGRAM (TEE);  Surgeon: Dorothy Spark, MD;  Location: [facility] ENDOSCOPY;  Service: Cardiovascular;  Laterality: N/A;  . TONSILLECTOMY    . TOTAL KNEE ARTHROPLASTY Right 08/02/2015   Procedure: TOTAL KNEE ARTHROPLASTY;  Surgeon: Renette Butters, MD;  Location: [place];  Service: Orthopedics;  Laterality: Right;  . VENOGRAM N/A 01/26/2013   Procedure: VENOGRAM;  Surgeon: Angelia Mould, MD;  Location: [facility] CATH LAB;  Service: Cardiovascular;  Laterality: N/A;    Social History   Socioeconomic History  . Marital status: Married    Spouse name: Not on file  . Number of children: 3  . Years of education: 66  . Highest education level: Not on file  Occupational History  . Occupation: disabled    Employer: DISABLED  . Occupation: formerly Lawyer for [employer].  Social Needs  . Financial resource strain: Not on file  . Food insecurity:    Worry: Not on file    Inability: Not on file  . Transportation needs:    Medical: Not on file    Non-medical: Not on file  Tobacco Use  . Smoking status: Former Smoker    Packs/day: 1.00    Years: 25.00    Pack years: 25.00    Types: Cigarettes    Last attempt to quit: 10/01/2005    Years since quitting: 13.1  . Smokeless tobacco: Former Systems developer    Quit date: 10/01/2005  . Tobacco comment: Quit smoking 2007 Smoked x 25 years 1/2 ppd.  Substance and Sexual Activity  . Alcohol use: No    Alcohol/week: 0.0 standard drinks  . Drug use: No  . Sexual activity: Never  Lifestyle  . Physical activity:    Days per week: 2 days    Minutes per  session: 10 min  . Stress: Only a little  Relationships  . Social connections:    Talks on phone: Three times a week    Gets together: Once a week    Attends religious service: 1 to 4 times per year    Active member of club or organization: No    Attends meetings of clubs or organizations: Never    Relationship status: Married  . Intimate partner violence:    Fear of current or ex partner: Not on file    Emotionally abused: Not on file    Physically abused: Not on file    Forced sexual activity: Not on file  Other Topics Concern  . Not on file  Social History Narrative   Lives with wife   Caffeine use: Tea sometimes   No coffee   Left handed  Current Outpatient Medications on File Prior to Visit  Medication Sig Dispense Refill  . acetaminophen (TYLENOL) 325 MG tablet Take 650 mg by mouth every 6 (six) hours as needed for mild pain or headache.     [name] acetaminophen-codeine (TYLENOL #3) 300-30 MG tablet Take 1-2 tablets by mouth 4 (four) times daily as needed.    . Alirocumab (PRALUENT) 150 MG/ML SOPN Inject 150 mg into the skin every 14 (fourteen) days. 2 pen 12  . allopurinol (ZYLOPRIM) 100 MG tablet TAKE ONE TABLET BY MOUTH ONCE DAILY (Patient taking differently: Take 100 mg by mouth daily. ) 90 tablet 3  . aspirin EC 325 MG tablet Take 1 tablet (325 mg total) by mouth daily. 30 tablet 0  . betamethasone dipropionate (DIPROLENE) 0.05 % cream Apply 1 application topically daily as needed (leg sores).    [name] ceFAZolin (ANCEF) 1-4 GM/50ML-% SOLN Inject 100 mLs (2 g total) into the vein every dialysis for up to 14 days (for 2 weeks - next 6 HD runs). 1500 mL   . citalopram (CELEXA) 10 MG tablet Take 1 tablet (10 mg total) by mouth at bedtime. 90 tablet 3  . colchicine 0.6 MG tablet Take 1 tablet (0.6 mg total) by mouth daily. 90 tablet 3  . doxercalciferol ([name]) 4 MCG/2ML injection Inject 1 mL (2 mcg total) into the vein Every Tuesday,Thursday,and Saturday with dialysis. 2 mL 0    . ezetimibe (ZETIA) 10 MG tablet TAKE ONE TABLET BY MOUTH ONCE DAILY (Patient taking differently: Take 10 mg by mouth daily. ) 90 tablet 3  . ferric citrate (AURYXIA) 1 GM 210 MG(Fe) tablet Take 2 tablets (420 mg total) by mouth 3 (three) times daily with meals. 60 tablet 0  . gabapentin (NEURONTIN) 100 MG capsule Take 1 capsule (100 mg total) by mouth at bedtime. 30 capsule 0  . hydroxypropyl methylcellulose / hypromellose (ISOPTO TEARS / GONIOVISC) 2.5 % ophthalmic solution Place 1 drop into both eyes 3 (three) times daily as needed for dry eyes. 15 mL 11  . methimazole (TAPAZOLE) 5 MG tablet Take 0.5 tablets (2.5 mg total) by mouth daily. 45 tablet 3  . multivitamin (RENA-VIT) TABS tablet Take 1 tablet by mouth daily.     . Nutritional Supplements (FEEDING SUPPLEMENT, NEPRO CARB STEADY,) LIQD Take 237 mLs by mouth 2 (two) times daily between meals. 7 Can 0  . pantoprazole (PROTONIX) 40 MG tablet Take 40 mg by mouth at bedtime.     . polyethylene glycol (MIRALAX / GLYCOLAX) packet Take 17 g by mouth daily. (Patient taking differently: Take 17 g by mouth daily as needed for mild constipation or moderate constipation. ) 14 each 0  . SENSIPAR 90 MG tablet Take 90 mg by mouth 2 (two) times daily.    . vancomycin (VANCOCIN) 125 MG capsule Take 1 capsule (125 mg total) by mouth 3 (three) times daily for 30 days. 90 capsule 0  . WELCHOL 625 MG tablet TAKE 3 TABLETS BY MOUTH TWICE DAILY WITH MEALS (Patient taking differently: Take 1,875 mg by mouth 2 (two) times daily with a meal. ) 180 tablet 0  . zolpidem (AMBIEN) 5 MG tablet Take 1 tablet (5 mg total) by mouth at bedtime as needed. for sleep (Patient taking differently: Take 5 mg by mouth at bedtime as needed for sleep. ) 90 tablet 1   No current facility-administered medications on file prior to visit.     Allergies  Allergen Reactions  . Statins Other (See Comments)  Weak muscles  . Ciprofloxacin Rash    Family History  Problem Relation  Age of Onset  . Heart disease Sister   . Thyroid nodules Sister   . Heart disease Father   . Diabetes Father   . Kidney failure Father   . Hypertension Father   . Healthy Child   . Healthy Child   . Healthy Child   . Cancer Neg Hx     BP 110/60 (BP Location: Left Arm, Patient Position: Sitting, Cuff Size: Large)   Pulse 97   Ht 6\' 1"  (1.854 m)   Wt 218 lb 6.4 oz (99.1 kg)   SpO2 93%   BMI 28.81 kg/m    Review of Systems No further fever.  He has lost 2 lbs since last ov.      Objective:   Physical Exam VITAL SIGNS:  See vs page GENERAL: no distress Pulses: dorsalis pedis intact bilat.   MSK: no deformity of the feet CV: no leg edema Skin:  no ulcer on the feet, but there is a skin abrasion on the dorsal aspect of the left foot.  normal color and temp on the feet. Neuro: sensation is intact to touch on the feet.  Ext: There is bilateral onychomycosis of the toenails.    Lab Results  Component Value Date   TSH 0.978 11/11/2018   T4TOTAL 5.6 07/19/2008   Lab Results  Component Value Date   HGBA1C 6.9 (A) 11/19/2018   Lab Results  Component Value Date   CREATININE 11.25 (H) 11/16/2018   BUN 59 (H) 11/16/2018   NA 138 11/16/2018   K 4.2 11/16/2018   CL 102 11/16/2018   CO2 25 11/16/2018         Assessment & Plan:  Type 2 DM, with CAD: worse Renal failure: when he resumes medication, he'll have limited options, so we'll hold off for now.  Hyperthyroidism: well-controlled.  Please continue the same medication. Foot abrasion: new.   Patient Instructions  Please continue the same medications Keep the scrape on your foot covered with antibiotic ointment and a bandaid until it heals.   If ever you have fever while taking methimazole, stop it and call us, even if the reason is obvious, because of the risk of a rare side-effect.  Please come back for a follow-up appointment in 2-3 months.

## 2018-11-19 NOTE — Patient Instructions (Addendum)
Please continue the same medications Keep the scrape on your foot covered with antibiotic ointment and a bandaid until it heals.   If ever you have fever while taking methimazole, stop it and call us, even if the reason is obvious, because of the risk of a rare side-effect.  Please come back for a follow-up appointment in 2-3 months.

## 2018-11-19 NOTE — Progress Notes (Signed)
[name] [address] [place]. [place] [address] [phone]       OFFICE FOLLOW UP NOTE  [name] Date of Birth:  04-12-1951 Medical Record Number:  [id number]   Reason for Referral:  hospital stroke follow up  CHIEF COMPLAINT:  Chief Complaint  Patient presents with  . Hospitalization Follow-up    Np for stroke. Wife present. Patient mentioned that he has been having trouble with word finding.     HPI: [name] is being seen today for initial visit in the office for silent right cerebellar infarct secondary to small vessel disease on 10/04/2018. History obtained from patient, wife and chart review. Reviewed all radiology images and labs personally.  [name] a 68 y.o.malewithESRD on HD, CAD, hypothyroidism, gout, recently admitted for sepsis and has had multiple episodes of bacteremia and also recently treated for C. difficile still on vancomycin who was admitted after altered mental status and difficulty ambulating.  Blood cultures returned with Klebsiella pneumonia secondary to unclear source.  MRI head performed due to AMS and showed 6 mm acute ischemic nonhemorrhagic right cerebellar infarct therefore neurology was consulted.  MRA head unremarkable.  It was felt as though infarct related to small vessel disease and clinically asymptomatic as his AMS likely related to encephalopathy.  2D echo normal EF without cardiac source embolus identified.  LDL 68 and A1c 6.0 with recommended continuation of follow-up with PCP for HTN, HLD and DM management as all currently stable.  Other stroke risk factors include advanced age, obesity, OSA on CPAP and CHF.  Recommended DAPT with aspirin and Plavix but patient refused Plavix due to bleeding history on Plavix therefore recommended increased dose of aspirin at 325 mg daily.  Stroke team signed off with recommendations of following up outpatient in this office.  [name] is being seen today for hospital  follow-up and is accompanied by his wife.  He has been stable from a neurological standpoint without recurring stroke/TIA symptoms.  He does have concerns of cognitive difficulties which has been present for the past 2 to 3 months and generalized weakness which has been present since his most recent hospital admission on 11/11/2018.  Home PT will be restarting to further assist with strengthening.  Is currently using rolling walker for ambulation and denies any recent falls.  He describes cognitive difficulties as delayed recall or forgetting certain conversations.  Per review of admission notes, OT assessment did confirm cognitive deficits along with lower extremity generalized weakness.  Wife does state that occasionally he will experience dizziness and headache but is typically related to hypotension especially after HD sessions.  He continues on aspirin 325 mg without side effects of bleeding or bruising.  Continues on atorvastatin without side effects myalgias.  Blood pressure today 104/61.  Reports continued compliance with CPAP for OSA.  He unfortunately had recent admission on 11/11/2018 with findings of septic shock due to cholangitis with choledocholithiasis and underwent ERCP.  Denies new or worsening stroke/TIA symptoms.     ROS:   14 system review of systems performed and negative with exception of fatigue, apnea, walking difficulty, cold intolerance, dizziness, headache and weakness  PMH:  Past Medical History:  Diagnosis Date  . Acute encephalopathy 10/04/2018  . Allergic rhinitis, cause unspecified 02/24/2014  . Anemia 06/16/2011  . BENIGN PROSTATIC HYPERTROPHY 10/14/2009  . CAD, NATIVE VESSEL 02/06/2009   a. 06/2007 s/p Taxus DES to the RCA;  b. 08/2016 NSTEMI in setting of SVT/PCI: LM  30ost, LAD 52m (3.0x16 Synergy DES), LCX 2m, OM1 60, RI 40, RCA 70p/m, 30m - not amenable to PCI.  [name] Cervical radiculopathy, chronic 02/23/2016   Right c5-6 by NCS/EMG  . Chest pain 08/09/2015  . Chronic  combined systolic (congestive) and diastolic (congestive) heart failure ([place])    a. 10/2016 Echo: EF 40-45%, Gr2 DD. mildly dil LA.  [name] COLONIC POLYPS, HX OF 10/14/2009  . Dementia ([place]) [id number]  . Depression 09/24/2015  . DIABETES MELLITUS, TYPE II 02/01/2010  . DYSLIPIDEMIA 06/18/2007  . ESRD (end stage renal disease) on dialysis ([place]) 08/04/2010   ESRD due to DM adn HTN, started HD in 2011. As of 2019 has a R arm graft and gets HD on TTS sched  . FOOT PAIN 08/12/2008  . GAIT DISTURBANCE 03/03/2010  . GASTROENTERITIS, VIRAL 10/14/2009  . GERD 06/18/2007  . GOITER, MULTINODULAR 12/26/2007  . GOUT 06/18/2007  . GYNECOMASTIA 07/17/2010  . Hemodialysis access, fistula mature [facility])    Dialysis T-Th-Sa ([place]) Right upper arm fistula  . Hyperlipidemia 10/16/2011  . Hyperparathyroidism, secondary ([place]) 06/16/2011  . HYPERTENSION 06/18/2007  . Hyperthyroidism   . Hypocalcemia 06/07/2010  . Ischemic cardiomyopathy    a. 10/2016 Echo: EF 40-45%.  . Lumbar stenosis with neurogenic claudication   . ONYCHOMYCOSIS, TOENAILS 12/26/2007  . OSA on CPAP 10/16/2011  . Other malaise and fatigue 11/24/2009  . PERIPHERAL NEUROPATHY 06/18/2007  . Prostate cancer ([place])   . PSVT (paroxysmal supraventricular tachycardia) ([place])    a. 01/3975 complicated by NSTEMI;  b. 11/2016 Treated w/ adenosine in ED;  c. 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  [name] PULMONARY NODULE, RIGHT LOWER LOBE 06/08/2009  . Sleep apnea    cpap machine and o2  . TRANSAMINASES, SERUM, ELEVATED 02/01/2010  . Transfusion history    none recent  . Unspecified hypotension 01/30/2010    PSH:  Past Surgical History:  Procedure Laterality Date  . A/V SHUNTOGRAM N/A 04/07/2018   Procedure: A/V SHUNTOGRAM;  Surgeon: Waynetta Sandy, MD;  Location: [place] [facility];  Service: Cardiovascular;  Laterality: N/A;  . ARTERIOVENOUS GRAFT PLACEMENT Right 2009   forearm/notes 02/01/2011  . AV FISTULA PLACEMENT  11/07/2011   Procedure: INSERTION OF  ARTERIOVENOUS (AV) GORE-TEX GRAFT ARM;  Surgeon: Tinnie Gens, MD;  Location: [place];  Service: Vascular;  Laterality: Left;  . BACK SURGERY  1998  . BALLOON DILATION N/A 11/14/2018   Procedure: BALLOON DILATION;  Surgeon: Ronnette Juniper, MD;  Location: [place];  Service: Gastroenterology;  Laterality: N/A;  . BASCILIC VEIN TRANSPOSITION Right 02/27/2013   Procedure: BASCILIC VEIN TRANSPOSITION;  Surgeon: Mal Misty, MD;  Location: [place];  Service: Vascular;  Laterality: Right;  Right Basilic Vein Transposition   . CARDIAC CATHETERIZATION N/A 08/06/2016   Procedure: Left Heart Cath and Coronary Angiography;  Surgeon: Jolaine Artist, MD;  Location: [place] [facility];  Service: Cardiovascular;  Laterality: N/A;  . CARDIAC CATHETERIZATION N/A 08/07/2016   Procedure: Coronary/Graft Atherectomy-CSI LAD;  Surgeon: Peter M Martinique, MD;  Location: [place] [facility];  Service: Cardiovascular;  Laterality: N/A;  . CERVICAL SPINE SURGERY  2/09   "to repair nerve problems in my left arm"  . CHOLECYSTECTOMY    . COLONOSCOPY WITH PROPOFOL N/A 04/26/2017   Procedure: COLONOSCOPY WITH PROPOFOL;  Surgeon: Otis Brace, MD;  Location: [place];  Service: Gastroenterology;  Laterality: N/A;  . CORONARY ANGIOPLASTY WITH STENT PLACEMENT  06/11/2008  . CORONARY ANGIOPLASTY WITH STENT PLACEMENT  06/2007   TAXUS stent to RCA/notes 01/31/2011  . ERCP N/A 11/14/2018   Procedure: ENDOSCOPIC RETROGRADE CHOLANGIOPANCREATOGRAPHY (ERCP);  Surgeon: Ronnette Juniper, MD;  Location: [place];  Service: Gastroenterology;  Laterality: N/A;  . ESOPHAGOGASTRODUODENOSCOPY  09/28/2011   Procedure: ESOPHAGOGASTRODUODENOSCOPY (EGD);  Surgeon: Missy Sabins, MD;  Location[facility] ENDOSCOPY;  Service: Endoscopy;  Laterality: N/A;  . ESOPHAGOGASTRODUODENOSCOPY N/A 04/07/2015   Procedure: ESOPHAGOGASTRODUODENOSCOPY (EGD);  Surgeon: Teena Irani, MD;  Location: [facility];  Service: Endoscopy;  Laterality: N/A;  .  ESOPHAGOGASTRODUODENOSCOPY N/A 04/19/2015   Procedure: ESOPHAGOGASTRODUODENOSCOPY (EGD);  Surgeon: Arta Silence, MD;  Location: [facility]COPY;  Service: Endoscopy;  Laterality: N/A;  . ESOPHAGOGASTRODUODENOSCOPY  11/14/2018   Procedure: ESOPHAGOGASTRODUODENOSCOPY (EGD);  Surgeon: Ronnette Juniper, MD;  Location: [facility] ENDOSCOPY;  Service: Gastroenterology;;  . ESOPHAGOGASTRODUODENOSCOPY (EGD) WITH PROPOFOL N/A 06/13/2018   Procedure: ESOPHAGOGASTRODUODENOSCOPY (EGD) WITH PROPOFOL;  Surgeon: Wonda Horner, MD;  Location: [facility] ENDOSCOPY;  Service: Endoscopy;  Laterality: N/A;  . FLEXIBLE SIGMOIDOSCOPY N/A 05/21/2017   Procedure: Conni Elliot;  Surgeon: Clarene Essex, MD;  Location: [place];  Service: Endoscopy;  Laterality: N/A;  . FLEXIBLE SIGMOIDOSCOPY Left 07/02/2017   Procedure: FLEXIBLE SIGMOIDOSCOPY;  Surgeon: Laurence Spates, MD;  Location: [place];  Service: Endoscopy;  Laterality: Left;  . FOREIGN BODY REMOVAL  09/2003   via upper endoscopy/notes 02/12/2011  . GIVENS CAPSULE STUDY  09/30/2011   Procedure: GIVENS CAPSULE STUDY;  Surgeon: Jeryl Columbia, MD;  Location: [facility] ENDOSCOPY;  Service: Endoscopy;  Laterality: N/A;  . INSERTION OF DIALYSIS CATHETER Right 2014  . INSERTION OF DIALYSIS CATHETER Left 02/11/2013   Procedure: INSERTION OF DIALYSIS CATHETER;  Surgeon: Conrad Miller, MD;  Location: [place];  Service: Vascular;  Laterality: Left;  Ultrasound guided  . IR AV DIALY SHUNT INTRO NEEDLE/INTRACATH INITIAL W/PTA/IMG RIGHT Right 11/14/2018  . IR US GUIDE VASC ACCESS RIGHT  11/14/2018  . LUMBAR LAMINECTOMY/DECOMPRESSION MICRODISCECTOMY Bilateral 07/31/2017   Procedure: LAMINECTOMY AND FORAMINOTOMY- BILATERAL LUMBAR TWO- LUMBAR THREE;  Surgeon: Earnie Larsson, MD;  Location: [place];  Service: Neurosurgery;  Laterality: Bilateral;  LAMINECTOMY AND FORAMINOTOMY- BILATERAL LUMBAR 2- LUMBAR 3  . PERIPHERAL VASCULAR BALLOON ANGIOPLASTY  04/07/2018   Procedure: PERIPHERAL VASCULAR BALLOON  ANGIOPLASTY;  Surgeon: Waynetta Sandy, MD;  Location: [place] [facility];  Service: Cardiovascular;;  RUE AVF  . REMOVAL OF A DIALYSIS CATHETER Right 02/11/2013   Procedure: REMOVAL OF A DIALYSIS CATHETER;  Surgeon: Conrad Watergate, MD;  Location: [place];  Service: Vascular;  Laterality: Right;  . REMOVAL OF STONES  11/14/2018   Procedure: REMOVAL OF STONES;  Surgeon: Ronnette Juniper, MD;  Location: [place];  Service: Gastroenterology;;  . Azzie Almas DILATION N/A 04/07/2015   Procedure: Azzie Almas DILATION;  Surgeon: Teena Irani, MD;  Location: [facility] ENDOSCOPY;  Service: Endoscopy;  Laterality: N/A;  . SHUNTOGRAM N/A 09/20/2011   Procedure: Earney Mallet;  Surgeon: Conrad Rio Blanco, MD;  Location: [facility] CATH LAB;  Service: Cardiovascular;  Laterality: N/A;  . SPHINCTEROTOMY  11/14/2018   Procedure: SPHINCTEROTOMY;  Surgeon: Ronnette Juniper, MD;  Location: [facility] ENDOSCOPY;  Service: Gastroenterology;;  . SVT ABLATION N/A 11/26/2016   Procedure: SVT Ablation;  Surgeon: Will Meredith Leeds, MD;  Location: [place] [facility];  Service: Cardiovascular;  Laterality: N/A;  . TEE WITHOUT CARDIOVERSION N/A 08/25/2018   Procedure: TRANSESOPHAGEAL ECHOCARDIOGRAM (TEE);  Surgeon: Dorothy Spark, MD;  Location: [facility] ENDOSCOPY;  Service: Cardiovascular;  Laterality: N/A;  . TONSILLECTOMY    . TOTAL KNEE ARTHROPLASTY Right  08/02/2015   Procedure: TOTAL KNEE ARTHROPLASTY;  Surgeon: Renette Butters, MD;  Location: [place];  Service: Orthopedics;  Laterality: Right;  . VENOGRAM N/A 01/26/2013   Procedure: VENOGRAM;  Surgeon: Angelia Mould, MD;  Location: [facility] CATH LAB;  Service: Cardiovascular;  Laterality: N/A;    Social History:  Social History   Socioeconomic History  . Marital status: Married    Spouse name: Not on file  . Number of children: 3  . Years of education: 37  . Highest education level: Not on file  Occupational History  . Occupation: disabled    Employer: DISABLED  . Occupation: formerly Lawyer for  [employer].  Social Needs  . Financial resource strain: Not on file  . Food insecurity:    Worry: Not on file    Inability: Not on file  . Transportation needs:    Medical: Not on file    Non-medical: Not on file  Tobacco Use  . Smoking status: Former Smoker    Packs/day: 1.00    Years: 25.00    Pack years: 25.00    Types: Cigarettes    Last attempt to quit: 10/01/2005    Years since quitting: 13.1  . Smokeless tobacco: Former Systems developer    Quit date: 10/01/2005  . Tobacco comment: Quit smoking 2007 Smoked x 25 years 1/2 ppd.  Substance and Sexual Activity  . Alcohol use: No    Alcohol/week: 0.0 standard drinks  . Drug use: No  . Sexual activity: Never  Lifestyle  . Physical activity:    Days per week: 2 days    Minutes per session: 10 min  . Stress: Only a little  Relationships  . Social connections:    Talks on phone: Three times a week    Gets together: Once a week    Attends religious service: 1 to 4 times per year    Active member of club or organization: No    Attends meetings of clubs or organizations: Never    Relationship status: Married  . Intimate partner violence:    Fear of current or ex partner: Not on file    Emotionally abused: Not on file    Physically abused: Not on file    Forced sexual activity: Not on file  Other Topics Concern  . Not on file  Social History Narrative   Lives with wife   Caffeine use: Tea sometimes   No coffee   Left handed     Family History:  Family History  Problem Relation Age of Onset  . Heart disease Sister   . Thyroid nodules Sister   . Heart disease Father   . Diabetes Father   . Kidney failure Father   . Hypertension Father   . Healthy Child   . Healthy Child   . Healthy Child   . Cancer Neg Hx     Medications:   Current Outpatient Medications on File Prior to Visit  Medication Sig Dispense Refill  . acetaminophen (TYLENOL) 325 MG tablet Take 650 mg by mouth every 6 (six) hours as needed for mild pain or  headache.     [name] acetaminophen-codeine (TYLENOL #3) 300-30 MG tablet Take 1-2 tablets by mouth 4 (four) times daily as needed.    . Alirocumab (PRALUENT) 150 MG/ML SOPN Inject 150 mg into the skin every 14 (fourteen) days. 2 pen 12  . allopurinol (ZYLOPRIM) 100 MG tablet TAKE ONE TABLET BY MOUTH ONCE DAILY (Patient taking differently: Take 100 mg  by mouth daily. ) 90 tablet 3  . aspirin EC 325 MG tablet Take 1 tablet (325 mg total) by mouth daily. 30 tablet 0  . betamethasone dipropionate (DIPROLENE) 0.05 % cream Apply 1 application topically daily as needed (leg sores).    [name] ceFAZolin (ANCEF) 1-4 GM/50ML-% SOLN Inject 100 mLs (2 g total) into the vein every dialysis for up to 14 days (for 2 weeks - next 6 HD runs). 1500 mL   . citalopram (CELEXA) 10 MG tablet Take 1 tablet (10 mg total) by mouth at bedtime. 90 tablet 3  . colchicine 0.6 MG tablet Take 1 tablet (0.6 mg total) by mouth daily. 90 tablet 3  . doxercalciferol ([name]) 4 MCG/2ML injection Inject 1 mL (2 mcg total) into the vein Every Tuesday,Thursday,and Saturday with dialysis. 2 mL 0  . ezetimibe (ZETIA) 10 MG tablet TAKE ONE TABLET BY MOUTH ONCE DAILY (Patient taking differently: Take 10 mg by mouth daily. ) 90 tablet 3  . ferric citrate (AURYXIA) 1 GM 210 MG(Fe) tablet Take 2 tablets (420 mg total) by mouth 3 (three) times daily with meals. 60 tablet 0  . gabapentin (NEURONTIN) 100 MG capsule Take 1 capsule (100 mg total) by mouth at bedtime. 30 capsule 0  . hydroxypropyl methylcellulose / hypromellose (ISOPTO TEARS / GONIOVISC) 2.5 % ophthalmic solution Place 1 drop into both eyes 3 (three) times daily as needed for dry eyes. 15 mL 11  . methimazole (TAPAZOLE) 5 MG tablet Take 0.5 tablets (2.5 mg total) by mouth daily. 45 tablet 3  . multivitamin (RENA-VIT) TABS tablet Take 1 tablet by mouth daily.     . Nutritional Supplements (FEEDING SUPPLEMENT, NEPRO CARB STEADY,) LIQD Take 237 mLs by mouth 2 (two) times daily between meals. 7  Can 0  . pantoprazole (PROTONIX) 40 MG tablet Take 40 mg by mouth at bedtime.     . polyethylene glycol (MIRALAX / GLYCOLAX) packet Take 17 g by mouth daily. (Patient taking differently: Take 17 g by mouth daily as needed for mild constipation or moderate constipation. ) 14 each 0  . SENSIPAR 90 MG tablet Take 90 mg by mouth 2 (two) times daily.    . vancomycin (VANCOCIN) 125 MG capsule Take 1 capsule (125 mg total) by mouth 3 (three) times daily for 30 days. 90 capsule 0  . WELCHOL 625 MG tablet TAKE 3 TABLETS BY MOUTH TWICE DAILY WITH MEALS (Patient taking differently: Take 1,875 mg by mouth 2 (two) times daily with a meal. ) 180 tablet 0  . zolpidem (AMBIEN) 5 MG tablet Take 1 tablet (5 mg total) by mouth at bedtime as needed. for sleep (Patient taking differently: Take 5 mg by mouth at bedtime as needed for sleep. ) 90 tablet 1   No current facility-administered medications on file prior to visit.     Allergies:   Allergies  Allergen Reactions  . Statins Other (See Comments)    Weak muscles  . Ciprofloxacin Rash     Physical Exam  Vitals:   11/19/18 0953  BP: 104/61  Pulse: 91  Weight: 217 lb 6.4 oz (98.6 kg)  Height: 6\' 1"  (1.854 m)   Body mass index is 28.68 kg/m. No exam data present  Depression screen [facility] 2/9 11/19/2018  Decreased Interest 0  Down, Depressed, Hopeless 0  PHQ - 2 Score 0  Altered sleeping -  Tired, decreased energy -  Change in appetite -  Feeling bad or failure about yourself  -  Trouble  concentrating -  Moving slowly or fidgety/restless -  Suicidal thoughts -  PHQ-9 Score -  Difficult doing work/chores -  Some recent data might be hidden    General: Flat affect middle-aged African-American male, seated, in no evident distress Head: head normocephalic and atraumatic.   Neck: supple with no carotid or supraclavicular bruits Cardiovascular: regular rate and rhythm, no murmurs Musculoskeletal: no deformity Skin:  no rash/petichiae Vascular:   Normal pulses all extremities  Neurologic Exam Mental Status: Awake and fully alert. Oriented to place and time. Recent and remote memory intact. Attention span, concentration and fund of knowledge appropriate. Mood and affect appropriate.  Cranial Nerves: Fundoscopic exam reveals sharp disc margins. Pupils equal, briskly reactive to light. Extraocular movements full without nystagmus. Visual fields full to confrontation. Hearing intact. Facial sensation intact. Face, tongue, palate moves normally and symmetrically.  Motor: LUE: 3-4/5 chronic from spinal condition; LLE: 4+/5; RUE: 5/5; RLE: 3+/5 at hip flexor Sensory.: intact to touch , pinprick , position and vibratory sensation.  Coordination: Chronic left hand decreased dexterity Gait and Station: Arises from chair without difficulty. Stance is normal. Gait demonstrates normal stride length and balance with slight favoring of RLE with assistance of rolling walker Reflexes: 1+ and symmetric. Toes downgoing.    NIHSS  0 Modified Rankin  0    Diagnostic Data (Labs, Imaging, Testing)  CT HEAD WO CONTRAST 10/04/2018 IMPRESSION: No acute intracranial abnormality.  MR BRAIN WO CONTRAST 10/04/2018 IMPRESSION: 1. 6 mm acute ischemic nonhemorrhagic right cerebellar infarct. 2. Underlying age-related cerebral atrophy with moderate chronic small vessel ischemic disease, with additional subcentimeter chronic right cerebellar infarct.  MR MRA HEAD  10/04/2018 IMPRESSION: No large or medium vessel occlusion or correctable proximal stenosis. Distal branch vessel atherosclerotic narrowing and irregularity seen throughout, more prominent within the superior cerebellar and posterior cerebral branches.  ECHOCARDIOGRAM 10/05/2018 Study Conclusions - Left ventricle: The cavity size was normal. Systolic function was   mildly reduced. The estimated ejection fraction was in the range   of 45% to 50%. Diffuse hypokinesis. Doppler parameters are    consistent with abnormal left ventricular relaxation (grade 1   diastolic dysfunction). - Aortic valve: Trileaflet; moderately thickened, moderately   calcified leaflets.    ASSESSMENT: LANTZ HERMANN is a 68 y.o. year old male here with silent right cerebellar infarct on 10/04/2018 secondary to small vessel disease. Vascular risk factors include HTN, HLD, DM, OSA, cardiomyopathy and CHF.  He is being seen today for hospital follow-up and continues to do well from a stroke standpoint without residual deficits or recurring of symptoms.  He does have ongoing concerns of cognitive/memory deficits along with generalized weakness which are most likely due to ongoing chronic conditions and not stroke related.    PLAN:  1. Right cerebellar infarct: Continue aspirin 325 mg daily  and atorvastatin for secondary stroke prevention. Maintain strict control of hypertension with blood pressure goal below 130/90, diabetes with hemoglobin A1c goal below 6.5% and cholesterol with LDL cholesterol (bad cholesterol) goal below 70 mg/dL.  I also advised the patient to eat a healthy diet with plenty of whole grains, cereals, fruits and vegetables, exercise regularly with at least 30 minutes of continuous activity daily and maintain ideal body weight. 2. Cognitive/memory deficits: Discussion with patient and wife regarding most likely due to underlying conditions and continuous infections requiring hospitalization.  Recommended participation in speech therapy but patient declines at this time 3. Generalized weakness: Recommended continuation of home PT for strengthening exercises.  Unable  to appreciate new onset weakness since hospitalization 4. OSA: Encouraged ongoing compliance with CPAP for OSA management 5. HTN: Advised to continue current treatment regimen.  Today's BP 104/61.  Advised to continue to monitor at home along with continued follow-up with PCP for management 6. HLD: Advised to continue current treatment  regimen along with continued follow-up with PCP for future prescribing and monitoring of lipid panel 7. DMII: Advised to continue to monitor glucose levels at home along with continued follow-up with PCP for management and monitoring    Follow up as needed per patient request due to frequent provider appointments and ongoing HD sessions.  Highly encouraged contacting office with any future questions, concerns for need of follow-up appointment.  Patient and wife verbalized understanding.    Greater than 50% of time during this 25 minute visit was spent on counseling, explanation of diagnosis of right cerebellar infarct, reviewing risk factor management of HTN, HLD and DM, planning of further management along with potential future management, and discussion with patient and family answering all questions.    Venancio Poisson, AGNP-BC  [facility] [name] [place], [place] [address]  Phone [phone] Fax [phone] Note: This document was prepared with digital dictation and possible smart phrase technology. Any transcriptional errors that result from this process are unintentional.

## 2018-11-19 NOTE — Patient Instructions (Addendum)
Continue aspirin 325 mg daily  and current cholesterol regimen  for secondary stroke prevention  Continue to follow up with PCP regarding cholesterol and blood pressure management   Continue physical therapy and if you would like to add speech therapy, please call office and order will be placed  Continue use of CPAP for sleep apnea management  Continue to stay active at home and maintain a healthy diet  Continue to monitor blood pressure at home  Maintain strict control of hypertension with blood pressure goal below 130/90, diabetes with hemoglobin A1c goal below 6.5% and cholesterol with LDL cholesterol (bad cholesterol) goal below 70 mg/dL. I also advised the patient to eat a healthy diet with plenty of whole grains, cereals, fruits and vegetables, exercise regularly and maintain ideal body weight.  Followup in the future with me as needed or call earlier if needed       Thank you for coming to see [place] at [facility]. I hope we have been able to provide you high quality care today.  You may receive a patient satisfaction survey over the next few weeks. We would appreciate your feedback and comments so that we may continue to improve ourselves and the health of our patients.     Stroke Prevention Some medical conditions and lifestyle choices can lead to a higher risk for a stroke. You can help to prevent a stroke by making nutrition, lifestyle, and other changes. What nutrition changes can be made?   Eat healthy foods. ? Choose foods that are high in fiber. These include:  Fresh fruits.  Fresh vegetables.  Whole grains. ? Eat at least 5 or more servings of fruits and vegetables each day. Try to fill half of your plate at each meal with fruits and vegetables. ? Choose lean protein foods. These include:  Lowfat (lean) cuts of meat.  Chicken without skin.  Fish.  Tofu.  Beans.  Nuts. ? Eat low-fat dairy products. ? Avoid foods that:  Are high  in salt (sodium).  Have saturated fat.  Have trans fat.  Have cholesterol.  Are processed.  Are premade.  Follow eating guidelines as told by your doctor. These may include: ? Reducing how many calories you eat and drink each day. ? Limiting how much salt you eat or drink each day to 1,500 milligrams (mg). ? Using only healthy fats for cooking. These include:  Olive oil.  Canola oil.  Sunflower oil. ? Counting how many carbohydrates you eat and drink each day. What lifestyle changes can be made?  Try to stay at a healthy weight. Talk to your doctor about what a good weight is for you.  Get at least 30 minutes of moderate physical activity at least 5 days a week. This can include: ? Fast walking. ? Biking. ? Swimming.  Do not use any products that have nicotine or tobacco. This includes cigarettes and e-cigarettes. If you need help quitting, ask your doctor. Avoid being around tobacco smoke in general.  Limit how much alcohol you drink to no more than 1 drink a day for nonpregnant women and 2 drinks a day for men. One drink equals 12 oz of beer, 5 oz of wine, or 1 oz of hard liquor.  Do not use drugs.  Avoid taking birth control pills. Talk to your doctor about the risks of taking birth control pills if: ? You are over 50 years old. ? You smoke. ? You get migraines. ? You have had a blood  clot. What other changes can be made?  Manage your cholesterol. ? It is important to eat a healthy diet. ? If your cholesterol cannot be managed through your diet, you may also need to take medicines. Take medicines as told by your doctor.  Manage your diabetes. ? It is important to eat a healthy diet and to exercise regularly. ? If your blood sugar cannot be managed through diet and exercise, you may need to take medicines. Take medicines as told by your doctor.  Control your high blood pressure (hypertension). ? Try to keep your blood pressure below 130/80. This can help lower  your risk of stroke. ? It is important to eat a healthy diet and to exercise regularly. ? If your blood pressure cannot be managed through diet and exercise, you may need to take medicines. Take medicines as told by your doctor. ? Ask your doctor if you should check your blood pressure at home. ? Have your blood pressure checked every year. Do this even if your blood pressure is normal.  Talk to your doctor about getting checked for a sleep disorder. Signs of this can include: ? Snoring a lot. ? Feeling very tired.  Take over-the-counter and prescription medicines only as told by your doctor. These may include aspirin or blood thinners (antiplatelets or anticoagulants).  Make sure that any other medical conditions you have are managed. Where to find more information  American Stroke Association: www.strokeassociation.org  National Stroke Association: www.stroke.org Get help right away if:  You have any symptoms of stroke. "BE FAST" is an easy way to remember the main warning signs: ? B - Balance. Signs are dizziness, sudden trouble walking, or loss of balance. ? E - Eyes. Signs are trouble seeing or a sudden change in how you see. ? F - Face. Signs are sudden weakness or loss of feeling of the face, or the face or eyelid drooping on one side. ? A - Arms. Signs are weakness or loss of feeling in an arm. This happens suddenly and usually on one side of the body. ? S - Speech. Signs are sudden trouble speaking, slurred speech, or trouble understanding what people say. ? T - Time. Time to call emergency services. Write down what time symptoms started.  You have other signs of stroke, such as: ? A sudden, very bad headache with no known cause. ? Feeling sick to your stomach (nausea). ? Throwing up (vomiting). ? Jerky movements you cannot control (seizure). These symptoms may represent a serious problem that is an emergency. Do not wait to see if the symptoms will go away. Get medical help  right away. Call your local emergency services (911 in the [place] Do not drive yourself to the hospital. Summary  You can prevent a stroke by eating healthy, exercising, not smoking, drinking less alcohol, and treating other health problems, such as diabetes, high blood pressure, or high cholesterol.  Do not use any products that contain nicotine or tobacco, such as cigarettes and e-cigarettes.  Get help right away if you have any signs or symptoms of a stroke. This information is not intended to replace advice given to you by your health care provider. Make sure you discuss any questions you have with your health care provider. Document Released: 03/18/2012 Document Revised: 12/19/2016 Document Reviewed: 12/19/2016 Elsevier Interactive Patient Education  2019 [employer].

## 2018-11-20 DIAGNOSIS — D631 Anemia in chronic kidney disease: Secondary | ICD-10-CM | POA: Diagnosis not present

## 2018-11-20 DIAGNOSIS — N186 End stage renal disease: Secondary | ICD-10-CM | POA: Diagnosis not present

## 2018-11-20 DIAGNOSIS — E119 Type 2 diabetes mellitus without complications: Secondary | ICD-10-CM | POA: Diagnosis not present

## 2018-11-20 DIAGNOSIS — N2581 Secondary hyperparathyroidism of renal origin: Secondary | ICD-10-CM | POA: Diagnosis not present

## 2018-11-20 DIAGNOSIS — D509 Iron deficiency anemia, unspecified: Secondary | ICD-10-CM | POA: Diagnosis not present

## 2018-11-20 DIAGNOSIS — K8309 Other cholangitis: Secondary | ICD-10-CM | POA: Diagnosis not present

## 2018-11-21 ENCOUNTER — Telehealth: Payer: Self-pay | Admitting: Internal Medicine

## 2018-11-21 DIAGNOSIS — I132 Hypertensive heart and chronic kidney disease with heart failure and with stage 5 chronic kidney disease, or end stage renal disease: Secondary | ICD-10-CM | POA: Diagnosis not present

## 2018-11-21 DIAGNOSIS — I5042 Chronic combined systolic (congestive) and diastolic (congestive) heart failure: Secondary | ICD-10-CM | POA: Diagnosis not present

## 2018-11-21 DIAGNOSIS — A4150 Gram-negative sepsis, unspecified: Secondary | ICD-10-CM | POA: Diagnosis not present

## 2018-11-21 DIAGNOSIS — E1122 Type 2 diabetes mellitus with diabetic chronic kidney disease: Secondary | ICD-10-CM | POA: Diagnosis not present

## 2018-11-21 DIAGNOSIS — A0472 Enterocolitis due to Clostridium difficile, not specified as recurrent: Secondary | ICD-10-CM | POA: Diagnosis not present

## 2018-11-21 DIAGNOSIS — I5082 Biventricular heart failure: Secondary | ICD-10-CM | POA: Diagnosis not present

## 2018-11-21 NOTE — Progress Notes (Signed)
I agree with the above plan 

## 2018-11-21 NOTE — Telephone Encounter (Signed)
Copied from [place] [id number]. Topic: Quick Communication - Home Health Verbal Orders >> Nov 21, 2018  3:54 PM Yvette Rack wrote: Caller/Agency: Gennaro Africa with [facility] Number: [phone] Requesting OT/PT/Skilled Nursing/Social Work: PT Frequency: 1 time a week for 1 week and 2 times a week for 3 weeks

## 2018-11-22 DIAGNOSIS — N2581 Secondary hyperparathyroidism of renal origin: Secondary | ICD-10-CM | POA: Diagnosis not present

## 2018-11-22 DIAGNOSIS — D509 Iron deficiency anemia, unspecified: Secondary | ICD-10-CM | POA: Diagnosis not present

## 2018-11-22 DIAGNOSIS — E119 Type 2 diabetes mellitus without complications: Secondary | ICD-10-CM | POA: Diagnosis not present

## 2018-11-22 DIAGNOSIS — N186 End stage renal disease: Secondary | ICD-10-CM | POA: Diagnosis not present

## 2018-11-22 DIAGNOSIS — D631 Anemia in chronic kidney disease: Secondary | ICD-10-CM | POA: Diagnosis not present

## 2018-11-22 DIAGNOSIS — K8309 Other cholangitis: Secondary | ICD-10-CM | POA: Diagnosis not present

## 2018-11-24 ENCOUNTER — Ambulatory Visit (INDEPENDENT_AMBULATORY_CARE_PROVIDER_SITE_OTHER): Payer: Medicare Other | Admitting: Cardiology

## 2018-11-24 ENCOUNTER — Telehealth: Payer: Self-pay | Admitting: Internal Medicine

## 2018-11-24 ENCOUNTER — Encounter: Payer: Self-pay | Admitting: Cardiology

## 2018-11-24 VITALS — BP 128/62 | HR 94 | Ht 73.0 in | Wt 217.0 lb

## 2018-11-24 DIAGNOSIS — E1122 Type 2 diabetes mellitus with diabetic chronic kidney disease: Secondary | ICD-10-CM | POA: Diagnosis not present

## 2018-11-24 DIAGNOSIS — I639 Cerebral infarction, unspecified: Secondary | ICD-10-CM | POA: Diagnosis not present

## 2018-11-24 DIAGNOSIS — A0472 Enterocolitis due to Clostridium difficile, not specified as recurrent: Secondary | ICD-10-CM | POA: Diagnosis not present

## 2018-11-24 DIAGNOSIS — I471 Supraventricular tachycardia: Secondary | ICD-10-CM | POA: Diagnosis not present

## 2018-11-24 DIAGNOSIS — I5082 Biventricular heart failure: Secondary | ICD-10-CM | POA: Diagnosis not present

## 2018-11-24 DIAGNOSIS — A4150 Gram-negative sepsis, unspecified: Secondary | ICD-10-CM | POA: Diagnosis not present

## 2018-11-24 DIAGNOSIS — I132 Hypertensive heart and chronic kidney disease with heart failure and with stage 5 chronic kidney disease, or end stage renal disease: Secondary | ICD-10-CM | POA: Diagnosis not present

## 2018-11-24 DIAGNOSIS — I5042 Chronic combined systolic (congestive) and diastolic (congestive) heart failure: Secondary | ICD-10-CM | POA: Diagnosis not present

## 2018-11-24 NOTE — Telephone Encounter (Signed)
Copied from [place] [id number]. Topic: Quick Communication - Home Health Verbal Orders >> Nov 24, 2018  4:18 PM Ivar Drape wrote: Caller/Agency:  Neoma Laming RN w/[facility] Number:   [phone]  Pt was evaluated and she and the patient came to the conclusion that he did not need skilled nursing at this time.

## 2018-11-24 NOTE — Telephone Encounter (Signed)
Verbal orders given via VM 

## 2018-11-24 NOTE — Telephone Encounter (Signed)
Verbal orders given  

## 2018-11-24 NOTE — Patient Instructions (Signed)
Medication Instructions:  Your physician recommends that you continue on your current medications as directed. Please refer to the Current Medication list given to you today.  * If you need a refill on your cardiac medications before your next appointment, please call your pharmacy.   Labwork: None ordered  Testing/Procedures: None ordered  Follow-Up: No follow up is needed at this time with Dr. Camnitz.  He will see you on an as needed basis.   Thank you for choosing [facility]!!   Bandon Sherwin, RN [phone]     

## 2018-11-24 NOTE — Progress Notes (Signed)
Electrophysiology Office Note   Date:  11/24/2018   ID:  [name], [name] 03/31/1951, MRN [id number]  PCP:  Biagio Borg, MD  Cardiologist:  [place] Primary Electrophysiologist:  Jennings Stirling Meredith Leeds, MD    No chief complaint on file.    History of Present Illness: [name] is a 68 y.o. male who presents today for electrophysiology evaluation.   He has a history of obesity, diabetes, COPD on nighttime oxygen, sleep apnea on CPAP, ESRD- HD, CAD, S/P Taxus drug-eluting stent to the right coronary in September 4854, chronic systolic/diastolic heart failure (Unable to tolerate statins so placed on Welchol), prostate CA s/p XRT. On 11/17, he underwent an STEMI and underwent PCI to the LAD. The RCA had a long 80-90% lesion but was felt not amenable to PCI due to tortuosity. He was admitted on 1/18 with SVT and an an STEMI. He had another episode of SVT on 11/15/16 and presented to the emergency room. This was complicated by hypotension, lightheadedness and blurry vision. He was undergoing dialysis at the time when his heart rate went up to the 160s. He was given 6 mg of adenosine which converted him to sinus rhythm. His symptoms then resolved. 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    Today, denies symptoms of palpitations, chest pain, shortness of breath, orthopnea, PND, lower extremity edema, claudication, dizziness, presyncope, syncope, bleeding, or neurologic sequela. The patient is tolerating medications without difficulties.  His main complaints are weakness and fatigue.  He is also been having episodes of syncope.  He was recently hospitalized with multiple episodes of sepsis.  His syncopal episodes were prior to or during acute infections.  I does not appear that his syncopal episodes are cardiac in nature.   Past Medical History:  Diagnosis Date  . Acute encephalopathy 10/04/2018  . Allergic rhinitis, cause unspecified 02/24/2014  . Anemia 06/16/2011  . BENIGN PROSTATIC  HYPERTROPHY 10/14/2009  . CAD, NATIVE VESSEL 02/06/2009   a. 06/2007 s/p Taxus DES to the RCA;  b. 08/2016 NSTEMI in setting of SVT/PCI: LM 30ost, LAD 36m (3.0x16 Synergy DES), LCX 48m, OM1 60, RI 40, RCA 70p/m, 71m - not amenable to PCI.  [name] Cervical radiculopathy, chronic 02/23/2016   Right c5-6 by NCS/EMG  . Chest pain 08/09/2015  . Chronic combined systolic (congestive) and diastolic (congestive) heart failure ([place])    a. 10/2016 Echo: EF 40-45%, Gr2 DD. mildly dil LA.  [name] COLONIC POLYPS, HX OF 10/14/2009  . Dementia ([place]) [id number]  . Depression 09/24/2015  . DIABETES MELLITUS, TYPE II 02/01/2010  . DYSLIPIDEMIA 06/18/2007  . ESRD (end stage renal disease) on dialysis ([place]) 08/04/2010   ESRD due to DM adn HTN, started HD in 2011. As of 2019 has a R arm graft and gets HD on TTS sched  . FOOT PAIN 08/12/2008  . GAIT DISTURBANCE 03/03/2010  . GASTROENTERITIS, VIRAL 10/14/2009  . GERD 06/18/2007  . GOITER, MULTINODULAR 12/26/2007  . GOUT 06/18/2007  . GYNECOMASTIA 07/17/2010  . Hemodialysis access, fistula [facility])    Dialysis T-Th-Sa ([place]) Right upper arm fistula  . Hyperlipidemia 10/16/2011  . Hyperparathyroidism, secondary ([place]) 06/16/2011  . HYPERTENSION 06/18/2007  . Hyperthyroidism   . Hypocalcemia 06/07/2010  . Ischemic cardiomyopathy    a. 10/2016 Echo: EF 40-45%.  . Lumbar stenosis with neurogenic claudication   . ONYCHOMYCOSIS, TOENAILS 12/26/2007  . OSA on CPAP 10/16/2011  . Other malaise and fatigue 11/24/2009  . PERIPHERAL NEUROPATHY  06/18/2007  . Prostate cancer ([place])   . PSVT (paroxysmal supraventricular tachycardia) ([place])    a. 85/8850 complicated by NSTEMI;  b. 11/2016 Treated w/ adenosine in ED;  c. 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  [name] PULMONARY NODULE, RIGHT LOWER LOBE 06/08/2009  . Sleep apnea    cpap machine and o2  . TRANSAMINASES, SERUM, ELEVATED 02/01/2010  . Transfusion history    none recent  . Unspecified hypotension 01/30/2010   Past Surgical History:    Procedure Laterality Date  . A/V SHUNTOGRAM N/A 04/07/2018   Procedure: A/V SHUNTOGRAM;  Surgeon: Waynetta Sandy, MD;  Location: [place] CV [facility];  Service: Cardiovascular;  Laterality: N/A;  . ARTERIOVENOUS GRAFT PLACEMENT Right 2009   forearm/notes 02/01/2011  . AV FISTULA PLACEMENT  11/07/2011   Procedure: INSERTION OF ARTERIOVENOUS (AV) GORE-TEX GRAFT ARM;  Surgeon: Tinnie Gens, MD;  Location: [place];  Service: Vascular;  Laterality: Left;  . BACK SURGERY  1998  . BALLOON DILATION N/A 11/14/2018   Procedure: BALLOON DILATION;  Surgeon: Ronnette Juniper, MD;  Location: [place];  Service: Gastroenterology;  Laterality: N/A;  . BASCILIC VEIN TRANSPOSITION Right 02/27/2013   Procedure: BASCILIC VEIN TRANSPOSITION;  Surgeon: Mal Misty, MD;  Location: [place];  Service: Vascular;  Laterality: Right;  Right Basilic Vein Transposition   . CARDIAC CATHETERIZATION N/A 08/06/2016   Procedure: Left Heart Cath and Coronary Angiography;  Surgeon: Jolaine Artist, MD;  Location: [place] [facility];  Service: Cardiovascular;  Laterality: N/A;  . CARDIAC CATHETERIZATION N/A 08/07/2016   Procedure: Coronary/Graft Atherectomy-CSI LAD;  Surgeon: Peter M Martinique, MD;  Location: [place] CV LAB;  Service: Cardiovascular;  Laterality: N/A;  . CERVICAL SPINE SURGERY  2/09   "to repair nerve problems in my left arm"  . CHOLECYSTECTOMY    . COLONOSCOPY WITH PROPOFOL N/A 04/26/2017   Procedure: COLONOSCOPY WITH PROPOFOL;  Surgeon: Otis Brace, MD;  Location: [place];  Service: Gastroenterology;  Laterality: N/A;  . CORONARY ANGIOPLASTY WITH STENT PLACEMENT  06/11/2008  . CORONARY ANGIOPLASTY WITH STENT PLACEMENT  06/2007   TAXUS stent to RCA/notes 01/31/2011  . ERCP N/A 11/14/2018   Procedure: ENDOSCOPIC RETROGRADE CHOLANGIOPANCREATOGRAPHY (ERCP);  Surgeon: Ronnette Juniper, MD;  Location: [place];  Service: Gastroenterology;  Laterality: N/A;  . ESOPHAGOGASTRODUODENOSCOPY  09/28/2011    Procedure: ESOPHAGOGASTRODUODENOSCOPY (EGD);  Surgeon: Missy Sabins, MD;  Location: [facility] ENDOSCOPY;  Service: Endoscopy;  Laterality: N/A;  . ESOPHAGOGASTRODUODENOSCOPY N/A 04/07/2015   Procedure: ESOPHAGOGASTRODUODENOSCOPY (EGD);  Surgeon: Teena Irani, MD;  Location: [facility];  Service: Endoscopy;  Laterality: N/A;  . ESOPHAGOGASTRODUODENOSCOPY N/A 04/19/2015   Procedure: ESOPHAGOGASTRODUODENOSCOPY (EGD);  Surgeon: Arta Silence, MD;  Location: [facility] ENDOSCOPY;  Service: Endoscopy;  Laterality: N/A;  . ESOPHAGOGASTRODUODENOSCOPY  11/14/2018   Procedure: ESOPHAGOGASTRODUODENOSCOPY (EGD);  Surgeon: Ronnette Juniper, MD;  Location: [facility] ENDOSCOPY;  Service: Gastroenterology;;  . ESOPHAGOGASTRODUODENOSCOPY (EGD) WITH PROPOFOL N/A 06/13/2018   Procedure: ESOPHAGOGASTRODUODENOSCOPY (EGD) WITH PROPOFOL;  Surgeon: Wonda Horner, MD;  Location: [facility] ENDOSCOPY;  Service: Endoscopy;  Laterality: N/A;  . FLEXIBLE SIGMOIDOSCOPY N/A 05/21/2017   Procedure: Conni Elliot;  Surgeon: Clarene Essex, MD;  Location: [place];  Service: Endoscopy;  Laterality: N/A;  . FLEXIBLE SIGMOIDOSCOPY Left 07/02/2017   Procedure: FLEXIBLE SIGMOIDOSCOPY;  Surgeon: Laurence Spates, MD;  Location: [place];  Service: Endoscopy;  Laterality: Left;  . FOREIGN BODY REMOVAL  09/2003   via upper endoscopy/notes 02/12/2011  . GIVENS CAPSULE STUDY  09/30/2011   Procedure: GIVENS CAPSULE STUDY;  Surgeon: Jeryl Columbia, MD;  Location: [facility] ENDOSCOPY;  Service: Endoscopy;  Laterality: N/A;  . INSERTION OF DIALYSIS CATHETER Right 2014  . INSERTION OF DIALYSIS CATHETER Left 02/11/2013   Procedure: INSERTION OF DIALYSIS CATHETER;  Surgeon: Conrad [place], MD;  Location: [place];  Service: Vascular;  Laterality: Left;  Ultrasound guided  . IR AV DIALY SHUNT INTRO NEEDLE/INTRACATH INITIAL W/PTA/IMG RIGHT Right 11/14/2018  . IR US GUIDE VASC ACCESS RIGHT  11/14/2018  . LUMBAR LAMINECTOMY/DECOMPRESSION MICRODISCECTOMY Bilateral 07/31/2017    Procedure: LAMINECTOMY AND FORAMINOTOMY- BILATERAL LUMBAR TWO- LUMBAR THREE;  Surgeon: Earnie Larsson, MD;  Location: [place];  Service: Neurosurgery;  Laterality: Bilateral;  LAMINECTOMY AND FORAMINOTOMY- BILATERAL LUMBAR 2- LUMBAR 3  . PERIPHERAL VASCULAR BALLOON ANGIOPLASTY  04/07/2018   Procedure: PERIPHERAL VASCULAR BALLOON ANGIOPLASTY;  Surgeon: Waynetta Sandy, MD;  Location: [place] CV LAB;  Service: Cardiovascular;;  RUE AVF  . REMOVAL OF A DIALYSIS CATHETER Right 02/11/2013   Procedure: REMOVAL OF A DIALYSIS CATHETER;  Surgeon: Conrad Swisher, MD;  Location: [place];  Service: Vascular;  Laterality: Right;  . REMOVAL OF STONES  11/14/2018   Procedure: REMOVAL OF STONES;  Surgeon: Ronnette Juniper, MD;  Location: [place];  Service: Gastroenterology;;  . Azzie Almas DILATION N/A 04/07/2015   Procedure: Azzie Almas DILATION;  Surgeon: Teena Irani, MD;  Location: [facility] ENDOSCOPY;  Service: Endoscopy;  Laterality: N/A;  . SHUNTOGRAM N/A 09/20/2011   Procedure: Earney Mallet;  Surgeon: Conrad C-Road, MD;  Location: [facility] CATH LAB;  Service: Cardiovascular;  Laterality: N/A;  . SPHINCTEROTOMY  11/14/2018   Procedure: SPHINCTEROTOMY;  Surgeon: Ronnette Juniper, MD;  Location: [facility] ENDOSCOPY;  Service: Gastroenterology;;  . SVT ABLATION N/A 11/26/2016   Procedure: SVT Ablation;  Surgeon: Avonelle Viveros Meredith Leeds, MD;  Location: [place] [facility] LAB;  Service: Cardiovascular;  Laterality: N/A;  . TEE WITHOUT CARDIOVERSION N/A 08/25/2018   Procedure: TRANSESOPHAGEAL ECHOCARDIOGRAM (TEE);  Surgeon: Dorothy Spark, MD;  Location: [facility] ENDOSCOPY;  Service: Cardiovascular;  Laterality: N/A;  . TONSILLECTOMY    . TOTAL KNEE ARTHROPLASTY Right 08/02/2015   Procedure: TOTAL KNEE ARTHROPLASTY;  Surgeon: Renette Butters, MD;  Location: [place];  Service: Orthopedics;  Laterality: Right;  . VENOGRAM N/A 01/26/2013   Procedure: VENOGRAM;  Surgeon: Angelia Mould, MD;  Location: [facility] CATH LAB;  Service: Cardiovascular;  Laterality: N/A;      Current Outpatient Medications  Medication Sig Dispense Refill  . acetaminophen (TYLENOL) 325 MG tablet Take 650 mg by mouth every 6 (six) hours as needed for mild pain or headache.     [name] acetaminophen-codeine (TYLENOL #3) 300-30 MG tablet Take 1-2 tablets by mouth 4 (four) times daily as needed.    . Alirocumab (PRALUENT) 150 MG/ML SOPN Inject 150 mg into the skin every 14 (fourteen) days. 2 pen 12  . allopurinol (ZYLOPRIM) 100 MG tablet TAKE ONE TABLET BY MOUTH ONCE DAILY (Patient taking differently: Take 100 mg by mouth daily. ) 90 tablet 3  . aspirin EC 325 MG tablet Take 1 tablet (325 mg total) by mouth daily. 30 tablet 0  . betamethasone dipropionate (DIPROLENE) 0.05 % cream Apply 1 application topically daily as needed (leg sores).    [name] ceFAZolin (ANCEF) 1-4 GM/50ML-% SOLN Inject 100 mLs (2 g total) into the vein every dialysis for up to 14 days (for 2 weeks - next 6 HD runs). 1500 mL   . citalopram (CELEXA) 10 MG tablet Take 1 tablet (10 mg total) by mouth at  bedtime. 90 tablet 3  . colchicine 0.6 MG tablet Take 1 tablet (0.6 mg total) by mouth daily. 90 tablet 3  . doxercalciferol ([name]) 4 MCG/2ML injection Inject 1 mL (2 mcg total) into the vein Every Tuesday,Thursday,and Saturday with dialysis. 2 mL 0  . ezetimibe (ZETIA) 10 MG tablet TAKE ONE TABLET BY MOUTH ONCE DAILY (Patient taking differently: Take 10 mg by mouth daily. ) 90 tablet 3  . ferric citrate (AURYXIA) 1 GM 210 MG(Fe) tablet Take 2 tablets (420 mg total) by mouth 3 (three) times daily with meals. 60 tablet 0  . gabapentin (NEURONTIN) 100 MG capsule Take 1 capsule (100 mg total) by mouth at bedtime. 30 capsule 0  . hydroxypropyl methylcellulose / hypromellose (ISOPTO TEARS / GONIOVISC) 2.5 % ophthalmic solution Place 1 drop into both eyes 3 (three) times daily as needed for dry eyes. 15 mL 11  . methimazole (TAPAZOLE) 5 MG tablet Take 0.5 tablets (2.5 mg total) by mouth daily. 45 tablet 3  . multivitamin  (RENA-VIT) TABS tablet Take 1 tablet by mouth daily.     . Nutritional Supplements (FEEDING SUPPLEMENT, NEPRO CARB STEADY,) LIQD Take 237 mLs by mouth 2 (two) times daily between meals. 7 Can 0  . pantoprazole (PROTONIX) 40 MG tablet Take 40 mg by mouth at bedtime.     . polyethylene glycol (MIRALAX / GLYCOLAX) packet Take 17 g by mouth daily. (Patient taking differently: Take 17 g by mouth daily as needed for mild constipation or moderate constipation. ) 14 each 0  . SENSIPAR 90 MG tablet Take 90 mg by mouth 2 (two) times daily.    . vancomycin (VANCOCIN) 125 MG capsule Take 1 capsule (125 mg total) by mouth 3 (three) times daily for 30 days. 90 capsule 0  . WELCHOL 625 MG tablet TAKE 3 TABLETS BY MOUTH TWICE DAILY WITH MEALS (Patient taking differently: Take 1,875 mg by mouth 2 (two) times daily with a meal. ) 180 tablet 0  . zolpidem (AMBIEN) 5 MG tablet Take 1 tablet (5 mg total) by mouth at bedtime as needed. for sleep (Patient taking differently: Take 5 mg by mouth at bedtime as needed for sleep. ) 90 tablet 1   No current facility-administered medications for this visit.     Allergies:   Statins and Ciprofloxacin   Social History:  The patient  reports that he quit smoking about 13 years ago. His smoking use included cigarettes. He has a 25.00 pack-year smoking history. He quit smokeless tobacco use about 13 years ago. He reports that he does not drink alcohol or use drugs.   Family History:  The patient's family history includes Diabetes in his father; Healthy in his child, child, and child; Heart disease in his father and sister; Hypertension in his father; Kidney failure in his father; Thyroid nodules in his sister.   ROS:  Please see the history of present illness.   Otherwise, review of systems is positive for fatigue, chest pain, leg pain, shortness of breath, balance problems, dizziness, passing out, easy bruising.   All other systems are reviewed and negative.   PHYSICAL EXAM: VS:   There were no vitals taken for this visit. , BMI There is no height or weight on file to calculate BMI. GEN: Well nourished, well developed, in no acute distress  HEENT: normal  Neck: no JVD, carotid bruits, or masses Cardiac: RRR; no murmurs, rubs, or gallops,no edema  Respiratory:  clear to auscultation bilaterally, normal work of breathing GI:  soft, nontender, nondistended, + BS MS: no deformity or atrophy  Skin: warm and dry Neuro:  Strength and sensation are intact Psych: euthymic mood, full affect  EKG:  EKG is not ordered today. Personal review of the ekg ordered 11/12/18 shows SR, PVCs, rate 107   Recent Labs: 11/11/2018: TSH 0.978 11/12/2018: Magnesium 1.9 11/15/2018: ALT 63 11/16/2018: BUN 59; Creatinine, Ser 11.25; Hemoglobin 10.7; Platelets 208; Potassium 4.2; Sodium 138    Lipid Panel     Component Value Date/Time   CHOL 171 10/04/2018 1105   TRIG 185 (H) 10/04/2018 1105   HDL 66 10/04/2018 1105   CHOLHDL 2.6 10/04/2018 1105   VLDL 37 10/04/2018 1105   LDLCALC 68 10/04/2018 1105   LDLDIRECT 136.0 07/16/2014 1503     Wt Readings from Last 3 Encounters:  11/19/18 218 lb 6.4 oz (99.1 kg)  11/19/18 217 lb 6.4 oz (98.6 kg)  11/16/18 219 lb 5.7 oz (99.5 kg)      Other studies Reviewed: Additional studies/ records that were reviewed today include: TTE 10/17/16, Cath 08/07/16  Review of the above records today demonstrates:  - Left ventricle: The cavity size was normal. Systolic function was   mildly to moderately reduced. The estimated ejection fraction was   in the range of 40% to 45%. Wall motion was normal; there were no   regional wall motion abnormalities. Features are consistent with   a pseudonormal left ventricular filling pattern, with concomitant   abnormal relaxation and increased filling pressure (grade 2   diastolic dysfunction). Doppler parameters are consistent with   elevated ventricular end-diastolic filling pressure. - Aortic valve: There was no  regurgitation. - Aortic root: The aortic root was normal in size. - Mitral valve: There was trivial regurgitation. - Left atrium: The atrium was mildly dilated. - Right ventricle: Systolic function was normal. - Right atrium: The atrium was normal in size. - Pulmonary arteries: Systolic pressure was within the normal   range. - Inferior vena cava: The vessel was normal in size. - Pericardium, extracardiac: There was no pericardial effusion.   Mid LAD lesion, 90 %stenosed.  Ost LM to LM lesion, 30 %stenosed.  Prox Cx to Mid Cx lesion, 40 %stenosed.  1st Mrg lesion, 60 %stenosed.  Ramus lesion, 40 %stenosed.  Prox RCA to Mid RCA lesion, 70 %stenosed.  Mid RCA lesion, 80 %stenosed.  Dist RCA lesion, 0 %stenosed.   Assessment:  1. Heavily calcified coronary arteries 2. High grade lesion in midLAD (90%) and borderline lesion midRCA (70-80%) 3. LVEDP 17   A STENT SYNERGY DES 3X16 drug eluting stent was successfully placed.  Mid LAD lesion, 90 %stenosed.  Post intervention, there is a 0% residual stenosis.   1. Successful orbital atherectomy and stenting of the mid LAD with DES  30-day monitor 10/17/2018 personally reviewed Sinus rhythm with sinus tachycardia  ASSESSMENT AND PLAN:  1.  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  No obvious recurrences.  2. Chronic systolic heart failure: Status monitored by dialysis.  Does have follow-up in heart failure clinic.  3. Coronary artery disease: No current chest pain.  4. Hypertension: Well-controlled.  5.  Syncope: At this point it is unclear as to the cause of his syncope.  He has had syncope multiple times, but they are all associated with infection and sepsis.  He wore a cardiac monitor that shows no major abnormalities.   Current medicines are reviewed at length with the patient today.   The patient does not have concerns regarding his medicines.  The following changes were made today: none  Labs/ tests ordered today include:    No orders of the defined types were placed in this encounter.    Disposition:   FU with Jacaden Forbush PRN months  Signed, Gionna Polak Meredith Leeds, MD  11/24/2018 11:05 AM     [facility] [address] [place] [address] [phone] (office) [phone] (fax)

## 2018-11-25 DIAGNOSIS — K8309 Other cholangitis: Secondary | ICD-10-CM | POA: Diagnosis not present

## 2018-11-25 DIAGNOSIS — D631 Anemia in chronic kidney disease: Secondary | ICD-10-CM | POA: Diagnosis not present

## 2018-11-25 DIAGNOSIS — N186 End stage renal disease: Secondary | ICD-10-CM | POA: Diagnosis not present

## 2018-11-25 DIAGNOSIS — N2581 Secondary hyperparathyroidism of renal origin: Secondary | ICD-10-CM | POA: Diagnosis not present

## 2018-11-25 DIAGNOSIS — E119 Type 2 diabetes mellitus without complications: Secondary | ICD-10-CM | POA: Diagnosis not present

## 2018-11-25 DIAGNOSIS — D509 Iron deficiency anemia, unspecified: Secondary | ICD-10-CM | POA: Diagnosis not present

## 2018-11-26 ENCOUNTER — Encounter: Payer: Self-pay | Admitting: Internal Medicine

## 2018-11-26 ENCOUNTER — Other Ambulatory Visit (INDEPENDENT_AMBULATORY_CARE_PROVIDER_SITE_OTHER): Payer: Medicare Other

## 2018-11-26 ENCOUNTER — Ambulatory Visit (INDEPENDENT_AMBULATORY_CARE_PROVIDER_SITE_OTHER): Payer: Medicare Other | Admitting: Internal Medicine

## 2018-11-26 VITALS — BP 110/66 | HR 92 | Temp 98.1°F | Ht 73.0 in | Wt 215.0 lb

## 2018-11-26 DIAGNOSIS — A0471 Enterocolitis due to Clostridium difficile, recurrent: Secondary | ICD-10-CM

## 2018-11-26 DIAGNOSIS — B179 Acute viral hepatitis, unspecified: Secondary | ICD-10-CM

## 2018-11-26 DIAGNOSIS — Z8619 Personal history of other infectious and parasitic diseases: Secondary | ICD-10-CM | POA: Diagnosis not present

## 2018-11-26 DIAGNOSIS — R6521 Severe sepsis with septic shock: Secondary | ICD-10-CM

## 2018-11-26 DIAGNOSIS — A419 Sepsis, unspecified organism: Secondary | ICD-10-CM

## 2018-11-26 LAB — CBC WITH DIFFERENTIAL/PLATELET
Basophils Absolute: 0.1 10*3/uL (ref 0.0–0.1)
Basophils Relative: 1.5 % (ref 0.0–3.0)
Eosinophils Absolute: 0.1 10*3/uL (ref 0.0–0.7)
Eosinophils Relative: 1.7 % (ref 0.0–5.0)
HEMATOCRIT: 37.1 % — AB (ref 39.0–52.0)
Hemoglobin: 12 g/dL — ABNORMAL LOW (ref 13.0–17.0)
Lymphocytes Relative: 25 % (ref 12.0–46.0)
Lymphs Abs: 2 10*3/uL (ref 0.7–4.0)
MCHC: 32.4 g/dL (ref 30.0–36.0)
MCV: 95.5 fl (ref 78.0–100.0)
Monocytes Absolute: 1.3 10*3/uL — ABNORMAL HIGH (ref 0.1–1.0)
Monocytes Relative: 15.9 % — ABNORMAL HIGH (ref 3.0–12.0)
Neutro Abs: 4.5 10*3/uL (ref 1.4–7.7)
Neutrophils Relative %: 55.9 % (ref 43.0–77.0)
Platelets: 309 10*3/uL (ref 150.0–400.0)
RBC: 3.88 Mil/uL — ABNORMAL LOW (ref 4.22–5.81)
RDW: 20.7 % — ABNORMAL HIGH (ref 11.5–15.5)
WBC: 8 10*3/uL (ref 4.0–10.5)

## 2018-11-26 LAB — HEPATIC FUNCTION PANEL
ALT: 1 U/L (ref 0–53)
AST: 20 U/L (ref 0–37)
Albumin: 4 g/dL (ref 3.5–5.2)
Alkaline Phosphatase: 144 U/L — ABNORMAL HIGH (ref 39–117)
Bilirubin, Direct: 0.1 mg/dL (ref 0.0–0.3)
Total Bilirubin: 0.4 mg/dL (ref 0.2–1.2)
Total Protein: 8 g/dL (ref 6.0–8.3)

## 2018-11-26 MED ORDER — ZOLPIDEM TARTRATE 5 MG PO TABS: 5.0000 mg | ORAL_TABLET | Freq: Every evening | ORAL | 1 refills | Status: DC | PRN

## 2018-11-26 NOTE — Assessment & Plan Note (Signed)
Stable, asympt, for f/u lab today

## 2018-11-26 NOTE — Assessment & Plan Note (Signed)
Resolved, for f/u labs

## 2018-11-26 NOTE — Patient Instructions (Addendum)
Please continue all other medications as before, and refills have been done if requested.  Please have the pharmacy call with any other refills you may need.  Please keep your appointments with your specialists as you may have planned  Please go to the LAB in the Basement (turn left off the elevator) for the tests to be done today  You will be contacted by phone if any changes need to be made immediately.  Otherwise, you will receive a letter about your results with an explanation, but please check with MyChart first.  Please remember to sign up for MyChart if you have not done so, as this will be important to you in the future with finding out test results, communicating by private email, and scheduling acute appointments online when needed.  Please return in 6 months, or sooner if needed

## 2018-11-26 NOTE — Assessment & Plan Note (Signed)
Tolerating vanc po, Please continue all other medications as before

## 2018-11-26 NOTE — Progress Notes (Signed)
Subjective:    Patient ID: [name], male    DOB: Sep 05, 1951, 68 y.o.   MRN: [id number]  HPI  68 y.o.male ESRD, prior history of recurrent Klebsiella bacteremia, recent history of C. difficile colitis DM-2, hypothyroidism, OSA on CPAP presented to the hospital on 2/11 from HD for hypotension, fever,transient abdominal pain, thought to have septic shock with unknown foci-admitted to the ICU with broad-spectrum antimicrobial therapy, and vasopressor support. Upon clinical stability, transferred to the triad hospitalist service on 2/13. On further evaluation, patient was found to have intra-/extrahepatic ductal dilatation-underwent MRCP on 2/13 that confirmed choledocholithiasis (likely cause of recurrent bacteremia)-GI was subsequently consulted  He underwent CT scan abdomen pelvis along with ERCP and MRCP. ERCP was done 11/14/2018 -Sphincterotomy was performed, balloon sweep was performed initially with 9/12 mm balloon, followed by 12/15 mm balloon. Large amount of sludge and stone was retrieved.  At d/c, His sepsis pathophysiology completely resolved, afebrile and back to baseline, no abdominal tenderness, history of cholecystectomy in the past.  Per ID he needed to get 6 more rounds of IV cefazolin for the next 2 weeks during his HD treatments, dose has been confirmed with pharmacy by me today, also have communicated to nephrology team about the same.  Nephrology following and directing care. AV malfunction on 2/13-underwent fistulogram with by IR on 2/14.Underwent HD late on 11/14/2018 and had another HD on 11/16/2018 and then discharged home.Also with Transaminitis:Could be from shock liver or passed gall stone-LFTs have markedly improved. Also on oral vancomycin for 30 days post d/c.  Today her with wife, overall doing well with good stamina, no new complaints Past Medical History:  Diagnosis Date  . Acute encephalopathy 10/04/2018  . Allergic rhinitis, cause unspecified 02/24/2014  .  Anemia 06/16/2011  . BENIGN PROSTATIC HYPERTROPHY 10/14/2009  . CAD, NATIVE VESSEL 02/06/2009   a. 06/2007 s/p Taxus DES to the RCA;  b. 08/2016 NSTEMI in setting of SVT/PCI: LM 30ost, LAD 66m (3.0x16 Synergy DES), LCX 18m, OM1 60, RI 40, RCA 70p/m, 29m - not amenable to PCI.  [name] Cervical radiculopathy, chronic 02/23/2016   Right c5-6 by NCS/EMG  . Chest pain 08/09/2015  . Chronic combined systolic (congestive) and diastolic (congestive) heart failure ([place])    a. 10/2016 Echo: EF 40-45%, Gr2 DD. mildly dil LA.  [name] COLONIC POLYPS, HX OF 10/14/2009  . Dementia ([place]) [id number]  . Depression 09/24/2015  . DIABETES MELLITUS, TYPE II 02/01/2010  . DYSLIPIDEMIA 06/18/2007  . ESRD (end stage renal disease) on dialysis ([place]) 08/04/2010   ESRD due to DM adn HTN, started HD in 2011. As of 2019 has a R arm graft and gets HD on TTS sched  . FOOT PAIN 08/12/2008  . GAIT DISTURBANCE 03/03/2010  . GASTROENTERITIS, VIRAL 10/14/2009  . GERD 06/18/2007  . GOITER, MULTINODULAR 12/26/2007  . GOUT 06/18/2007  . GYNECOMASTIA 07/17/2010  . Hemodialysis access, fistula [facility])    Dialysis T-Th-Sa ([place]) Right upper arm fistula  . Hyperlipidemia 10/16/2011  . Hyperparathyroidism, secondary ([place]) 06/16/2011  . HYPERTENSION 06/18/2007  . Hyperthyroidism   . Hypocalcemia 06/07/2010  . Ischemic cardiomyopathy    a. 10/2016 Echo: EF 40-45%.  . Lumbar stenosis with neurogenic claudication   . ONYCHOMYCOSIS, TOENAILS 12/26/2007  . OSA on CPAP 10/16/2011  . Other malaise and fatigue 11/24/2009  . PERIPHERAL NEUROPATHY 06/18/2007  . Prostate cancer ([place])   . PSVT (paroxysmal supraventricular tachycardia) ([place])    a. 06/7352 complicated by NSTEMI;  b. 11/2016 Treated w/ adenosine in ED;  c. 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  [name] PULMONARY NODULE, RIGHT LOWER LOBE 06/08/2009  . Sleep apnea    cpap machine and o2  . TRANSAMINASES, SERUM, ELEVATED 02/01/2010  . Transfusion history    none recent  . Unspecified hypotension  01/30/2010   Past Surgical History:  Procedure Laterality Date  . A/V SHUNTOGRAM N/A 04/07/2018   Procedure: A/V SHUNTOGRAM;  Surgeon: Waynetta Sandy, MD;  Location: [place] CV LAB;  Service: Cardiovascular;  Laterality: N/A;  . ARTERIOVENOUS GRAFT PLACEMENT Right 2009   forearm/notes 02/01/2011  . AV FISTULA PLACEMENT  11/07/2011   Procedure: INSERTION OF ARTERIOVENOUS (AV) GORE-TEX GRAFT ARM;  Surgeon: Tinnie Gens, MD;  Location: [place];  Service: Vascular;  Laterality: Left;  . BACK SURGERY  1998  . BALLOON DILATION N/A 11/14/2018   Procedure: BALLOON DILATION;  Surgeon: Ronnette Juniper, MD;  Location: [place];  Service: Gastroenterology;  Laterality: N/A;  . BASCILIC VEIN TRANSPOSITION Right 02/27/2013   Procedure: BASCILIC VEIN TRANSPOSITION;  Surgeon: Mal Misty, MD;  Location: [place];  Service: Vascular;  Laterality: Right;  Right Basilic Vein Transposition   . CARDIAC CATHETERIZATION N/A 08/06/2016   Procedure: Left Heart Cath and Coronary Angiography;  Surgeon: Jolaine Artist, MD;  Location: [place] CV LAB;  Service: Cardiovascular;  Laterality: N/A;  . CARDIAC CATHETERIZATION N/A 08/07/2016   Procedure: Coronary/Graft Atherectomy-CSI LAD;  Surgeon: Peter M Martinique, MD;  Location: [place] CV LAB;  Service: Cardiovascular;  Laterality: N/A;  . CERVICAL SPINE SURGERY  2/09   "to repair nerve problems in my left arm"  . CHOLECYSTECTOMY    . COLONOSCOPY WITH PROPOFOL N/A 04/26/2017   Procedure: COLONOSCOPY WITH PROPOFOL;  Surgeon: Otis Brace, MD;  Location: [place];  Service: Gastroenterology;  Laterality: N/A;  . CORONARY ANGIOPLASTY WITH STENT PLACEMENT  06/11/2008  . CORONARY ANGIOPLASTY WITH STENT PLACEMENT  06/2007   TAXUS stent to RCA/notes 01/31/2011  . ERCP N/A 11/14/2018   Procedure: ENDOSCOPIC RETROGRADE CHOLANGIOPANCREATOGRAPHY (ERCP);  Surgeon: Ronnette Juniper, MD;  Location: [place];  Service: Gastroenterology;  Laterality: N/A;  .  ESOPHAGOGASTRODUODENOSCOPY  09/28/2011   Procedure: ESOPHAGOGASTRODUODENOSCOPY (EGD);  Surgeon: Missy Sabins, MD;  Location: [facility] ENDOSCOPY;  Service: Endoscopy;  Laterality: N/A;  . ESOPHAGOGASTRODUODENOSCOPY N/A 04/07/2015   Procedure: ESOPHAGOGASTRODUODENOSCOPY (EGD);  Surgeon: Teena Irani, MD;  Location: [facility];  Service: Endoscopy;  Laterality: N/A;  . ESOPHAGOGASTRODUODENOSCOPY N/A 04/19/2015   Procedure: ESOPHAGOGASTRODUODENOSCOPY (EGD);  Surgeon: Arta Silence, MD;  Location: [facility] ENDOSCOPY;  Service: Endoscopy;  Laterality: N/A;  . ESOPHAGOGASTRODUODENOSCOPY  11/14/2018   Procedure: ESOPHAGOGASTRODUODENOSCOPY (EGD);  Surgeon: Ronnette Juniper, MD;  Location: [facility] ENDOSCOPY;  Service: Gastroenterology;;  . ESOPHAGOGASTRODUODENOSCOPY (EGD) WITH PROPOFOL N/A 06/13/2018   Procedure: ESOPHAGOGASTRODUODENOSCOPY (EGD) WITH PROPOFOL;  Surgeon: Wonda Horner, MD;  Location: [facility] ENDOSCOPY;  Service: Endoscopy;  Laterality: N/A;  . FLEXIBLE SIGMOIDOSCOPY N/A 05/21/2017   Procedure: Conni Elliot;  Surgeon: Clarene Essex, MD;  Location: [place];  Service: Endoscopy;  Laterality: N/A;  . FLEXIBLE SIGMOIDOSCOPY Left 07/02/2017   Procedure: FLEXIBLE SIGMOIDOSCOPY;  Surgeon: Laurence Spates, MD;  Location: [place];  Service: Endoscopy;  Laterality: Left;  . FOREIGN BODY REMOVAL  09/2003   via upper endoscopy/notes 02/12/2011  . GIVENS CAPSULE STUDY  09/30/2011   Procedure: GIVENS CAPSULE STUDY;  Surgeon: Jeryl Columbia, MD;  Location: [facility] ENDOSCOPY;  Service: Endoscopy;  Laterality: N/A;  . INSERTION OF DIALYSIS CATHETER Right 2014  .  INSERTION OF DIALYSIS CATHETER Left 02/11/2013   Procedure: INSERTION OF DIALYSIS CATHETER;  Surgeon: Conrad Wahneta, MD;  Location: [place];  Service: Vascular;  Laterality: Left;  Ultrasound guided  . IR AV DIALY SHUNT INTRO NEEDLE/INTRACATH INITIAL W/PTA/IMG RIGHT Right 11/14/2018  . IR US GUIDE VASC ACCESS RIGHT  11/14/2018  . LUMBAR LAMINECTOMY/DECOMPRESSION  MICRODISCECTOMY Bilateral 07/31/2017   Procedure: LAMINECTOMY AND FORAMINOTOMY- BILATERAL LUMBAR TWO- LUMBAR THREE;  Surgeon: Earnie Larsson, MD;  Location: [place];  Service: Neurosurgery;  Laterality: Bilateral;  LAMINECTOMY AND FORAMINOTOMY- BILATERAL LUMBAR 2- LUMBAR 3  . PERIPHERAL VASCULAR BALLOON ANGIOPLASTY  04/07/2018   Procedure: PERIPHERAL VASCULAR BALLOON ANGIOPLASTY;  Surgeon: Waynetta Sandy, MD;  Location: [place] CV LAB;  Service: Cardiovascular;;  RUE AVF  . REMOVAL OF A DIALYSIS CATHETER Right 02/11/2013   Procedure: REMOVAL OF A DIALYSIS CATHETER;  Surgeon: Conrad Hickman, MD;  Location: [place];  Service: Vascular;  Laterality: Right;  . REMOVAL OF STONES  11/14/2018   Procedure: REMOVAL OF STONES;  Surgeon: Ronnette Juniper, MD;  Location: [place];  Service: Gastroenterology;;  . Azzie Almas DILATION N/A 04/07/2015   Procedure: Azzie Almas DILATION;  Surgeon: Teena Irani, MD;  Location: [facility] ENDOSCOPY;  Service: Endoscopy;  Laterality: N/A;  . SHUNTOGRAM N/A 09/20/2011   Procedure: Earney Mallet;  Surgeon: Conrad Scioto, MD;  Location: [facility] CATH LAB;  Service: Cardiovascular;  Laterality: N/A;  . SPHINCTEROTOMY  11/14/2018   Procedure: SPHINCTEROTOMY;  Surgeon: Ronnette Juniper, MD;  Location: [facility] ENDOSCOPY;  Service: Gastroenterology;;  . SVT ABLATION N/A 11/26/2016   Procedure: SVT Ablation;  Surgeon: Will Meredith Leeds, MD;  Location: [place] CV LAB;  Service: Cardiovascular;  Laterality: N/A;  . TEE WITHOUT CARDIOVERSION N/A 08/25/2018   Procedure: TRANSESOPHAGEAL ECHOCARDIOGRAM (TEE);  Surgeon: Dorothy Spark, MD;  Location: [facility] ENDOSCOPY;  Service: Cardiovascular;  Laterality: N/A;  . TONSILLECTOMY    . TOTAL KNEE ARTHROPLASTY Right 08/02/2015   Procedure: TOTAL KNEE ARTHROPLASTY;  Surgeon: Renette Butters, MD;  Location: [place];  Service: Orthopedics;  Laterality: Right;  . VENOGRAM N/A 01/26/2013   Procedure: VENOGRAM;  Surgeon: Angelia Mould, MD;  Location: [facility] CATH LAB;   Service: Cardiovascular;  Laterality: N/A;    reports that he quit smoking about 13 years ago. His smoking use included cigarettes. He has a 25.00 pack-year smoking history. He quit smokeless tobacco use about 13 years ago. He reports that he does not drink alcohol or use drugs. family history includes Diabetes in his father; Healthy in his child, child, and child; Heart disease in his father and sister; Hypertension in his father; Kidney failure in his father; Thyroid nodules in his sister. Allergies  Allergen Reactions  . Statins Other (See Comments)    Weak muscles  . Ciprofloxacin Rash   Current Outpatient Medications on File Prior to Visit  Medication Sig Dispense Refill  . acetaminophen (TYLENOL) 325 MG tablet Take 650 mg by mouth every 6 (six) hours as needed for mild pain or headache.     . Alirocumab (PRALUENT) 150 MG/ML SOPN Inject 150 mg into the skin every 14 (fourteen) days. 2 pen 12  . allopurinol (ZYLOPRIM) 100 MG tablet TAKE ONE TABLET BY MOUTH ONCE DAILY 90 tablet 3  . aspirin EC 325 MG tablet Take 1 tablet (325 mg total) by mouth daily. 30 tablet 0  . betamethasone dipropionate (DIPROLENE) 0.05 % cream Apply 1 application topically daily as needed (leg sores).    [name] ceFAZolin (ANCEF) 1-4  GM/50ML-% SOLN Inject 100 mLs (2 g total) into the vein every dialysis for up to 14 days (for 2 weeks - next 6 HD runs). 1500 mL   . citalopram (CELEXA) 10 MG tablet Take 1 tablet (10 mg total) by mouth at bedtime. 90 tablet 3  . colchicine 0.6 MG tablet Take 1 tablet (0.6 mg total) by mouth daily. 90 tablet 3  . doxercalciferol ([name]) 4 MCG/2ML injection Inject 1 mL (2 mcg total) into the vein Every Tuesday,Thursday,and Saturday with dialysis. 2 mL 0  . ezetimibe (ZETIA) 10 MG tablet TAKE ONE TABLET BY MOUTH ONCE DAILY 90 tablet 3  . ferric citrate (AURYXIA) 1 GM 210 MG(Fe) tablet Take 2 tablets (420 mg total) by mouth 3 (three) times daily with meals. 60 tablet 0  . gabapentin  (NEURONTIN) 100 MG capsule Take 1 capsule (100 mg total) by mouth at bedtime. 30 capsule 0  . hydroxypropyl methylcellulose / hypromellose (ISOPTO TEARS / GONIOVISC) 2.5 % ophthalmic solution Place 1 drop into both eyes 3 (three) times daily as needed for dry eyes. 15 mL 11  . methimazole (TAPAZOLE) 5 MG tablet Take 0.5 tablets (2.5 mg total) by mouth daily. 45 tablet 3  . multivitamin (RENA-VIT) TABS tablet Take 1 tablet by mouth daily.     . Nutritional Supplements (FEEDING SUPPLEMENT, NEPRO CARB STEADY,) LIQD Take 237 mLs by mouth 2 (two) times daily between meals. 7 Can 0  . pantoprazole (PROTONIX) 40 MG tablet Take 40 mg by mouth at bedtime.     . polyethylene glycol (MIRALAX / GLYCOLAX) packet Take 17 g by mouth daily. (Patient taking differently: Take 17 g by mouth daily as needed for mild constipation or moderate constipation. ) 14 each 0  . SENSIPAR 90 MG tablet Take 90 mg by mouth 2 (two) times daily.    . vancomycin (VANCOCIN) 125 MG capsule Take 1 capsule (125 mg total) by mouth 3 (three) times daily for 30 days. 90 capsule 0  . WELCHOL 625 MG tablet TAKE 3 TABLETS BY MOUTH TWICE DAILY WITH MEALS 180 tablet 0   No current facility-administered medications on file prior to visit.    Review of Systems Constitutional: Negative for other unusual diaphoresis, sweats, appetite or weight changes HENT: Negative for other worsening hearing loss, ear pain, facial swelling, mouth sores or neck stiffness.   Eyes: Negative for other worsening pain, redness or other visual disturbance.  Respiratory: Negative for other stridor or swelling Cardiovascular: Negative for other palpitations or other chest pain  Gastrointestinal: Negative for worsening diarrhea or loose stools, blood in stool, distention or other pain Genitourinary: Negative for hematuria, flank pain or other change in urine volume.  Musculoskeletal: Negative for myalgias or other joint swelling.  Skin: Negative for other color change,  or other wound or worsening drainage.  Neurological: Negative for other syncope or numbness. Hematological: Negative for other adenopathy or swelling Psychiatric/Behavioral: Negative for hallucinations, other worsening agitation, SI, self-injury, or new decreased concentration All other system neg per pt    Objective:   Physical Exam BP 110/66   Pulse 92   Temp 98.1 F (36.7 C) (Oral)   Ht 6\' 1"  (1.854 m)   Wt 215 lb (97.5 kg)   SpO2 92%   BMI 28.37 kg/m  VS noted, not ill appearing Constitutional: Pt is oriented to person, place, and time. Appears well-developed and well-nourished, in no significant distress and comfortable Head: Normocephalic and atraumatic  Eyes: Conjunctivae and EOM are normal. Pupils are  equal, round, and reactive to light Right Ear: External ear normal without discharge Left Ear: External ear normal without discharge Nose: Nose without discharge or deformity Mouth/Throat: Oropharynx is without other ulcerations and moist  Neck: Normal range of motion. Neck supple. No JVD present. No tracheal deviation present or significant neck LA or mass Cardiovascular: Normal rate, regular rhythm, normal heart sounds and intact distal pulses.   Pulmonary/Chest: WOB normal and breath sounds without rales or wheezing  Abdominal: Soft. Bowel sounds are normal. NT. No HSM  Musculoskeletal: Normal range of motion. Exhibits no edema Lymphadenopathy: Has no other cervical adenopathy.  Neurological: Pt is alert and oriented to person, place, and time. Pt has normal reflexes. No cranial nerve deficit. Motor grossly intact, Gait intact Skin: Skin is warm and dry. No rash noted or new ulcerations, + thrill LUE Psychiatric:  Has normal mood and affect. Behavior is normal without agitation No other exam findings Lab Results  Component Value Date   WBC 8.0 11/26/2018   HGB 12.0 (L) 11/26/2018   HCT 37.1 (L) 11/26/2018   PLT 309.0 11/26/2018   GLUCOSE 121 (H) 11/16/2018   CHOL 171  10/04/2018   TRIG 185 (H) 10/04/2018   HDL 66 10/04/2018   LDLDIRECT 136.0 07/16/2014   LDLCALC 68 10/04/2018   ALT 1 11/26/2018   AST 20 11/26/2018   NA 138 11/16/2018   K 4.2 11/16/2018   CL 102 11/16/2018   CREATININE 11.25 (H) 11/16/2018   BUN 59 (H) 11/16/2018   CO2 25 11/16/2018   TSH 0.978 11/11/2018   PSA 3.67 08/30/2014   INR 0.99 11/14/2018   HGBA1C 6.9 (A) 11/19/2018       Assessment & Plan:

## 2018-11-27 DIAGNOSIS — N186 End stage renal disease: Secondary | ICD-10-CM | POA: Diagnosis not present

## 2018-11-27 DIAGNOSIS — D631 Anemia in chronic kidney disease: Secondary | ICD-10-CM | POA: Diagnosis not present

## 2018-11-27 DIAGNOSIS — E119 Type 2 diabetes mellitus without complications: Secondary | ICD-10-CM | POA: Diagnosis not present

## 2018-11-27 DIAGNOSIS — N2581 Secondary hyperparathyroidism of renal origin: Secondary | ICD-10-CM | POA: Diagnosis not present

## 2018-11-27 DIAGNOSIS — K8309 Other cholangitis: Secondary | ICD-10-CM | POA: Diagnosis not present

## 2018-11-27 DIAGNOSIS — D509 Iron deficiency anemia, unspecified: Secondary | ICD-10-CM | POA: Diagnosis not present

## 2018-11-28 DIAGNOSIS — A0472 Enterocolitis due to Clostridium difficile, not specified as recurrent: Secondary | ICD-10-CM | POA: Diagnosis not present

## 2018-11-28 DIAGNOSIS — I5042 Chronic combined systolic (congestive) and diastolic (congestive) heart failure: Secondary | ICD-10-CM | POA: Diagnosis not present

## 2018-11-28 DIAGNOSIS — I5082 Biventricular heart failure: Secondary | ICD-10-CM | POA: Diagnosis not present

## 2018-11-28 DIAGNOSIS — A4150 Gram-negative sepsis, unspecified: Secondary | ICD-10-CM | POA: Diagnosis not present

## 2018-11-28 DIAGNOSIS — I132 Hypertensive heart and chronic kidney disease with heart failure and with stage 5 chronic kidney disease, or end stage renal disease: Secondary | ICD-10-CM | POA: Diagnosis not present

## 2018-11-28 DIAGNOSIS — E1122 Type 2 diabetes mellitus with diabetic chronic kidney disease: Secondary | ICD-10-CM | POA: Diagnosis not present

## 2018-11-29 DIAGNOSIS — D631 Anemia in chronic kidney disease: Secondary | ICD-10-CM | POA: Diagnosis not present

## 2018-11-29 DIAGNOSIS — D509 Iron deficiency anemia, unspecified: Secondary | ICD-10-CM | POA: Diagnosis not present

## 2018-11-29 DIAGNOSIS — N186 End stage renal disease: Secondary | ICD-10-CM | POA: Diagnosis not present

## 2018-11-29 DIAGNOSIS — K8309 Other cholangitis: Secondary | ICD-10-CM | POA: Diagnosis not present

## 2018-11-29 DIAGNOSIS — E119 Type 2 diabetes mellitus without complications: Secondary | ICD-10-CM | POA: Diagnosis not present

## 2018-11-29 DIAGNOSIS — N2581 Secondary hyperparathyroidism of renal origin: Secondary | ICD-10-CM | POA: Diagnosis not present

## 2018-11-30 DIAGNOSIS — Z992 Dependence on renal dialysis: Secondary | ICD-10-CM | POA: Diagnosis not present

## 2018-11-30 DIAGNOSIS — N186 End stage renal disease: Secondary | ICD-10-CM | POA: Diagnosis not present

## 2018-11-30 DIAGNOSIS — E1129 Type 2 diabetes mellitus with other diabetic kidney complication: Secondary | ICD-10-CM | POA: Diagnosis not present

## 2018-12-01 DIAGNOSIS — E1122 Type 2 diabetes mellitus with diabetic chronic kidney disease: Secondary | ICD-10-CM | POA: Diagnosis not present

## 2018-12-01 DIAGNOSIS — I5042 Chronic combined systolic (congestive) and diastolic (congestive) heart failure: Secondary | ICD-10-CM | POA: Diagnosis not present

## 2018-12-01 DIAGNOSIS — A0472 Enterocolitis due to Clostridium difficile, not specified as recurrent: Secondary | ICD-10-CM | POA: Diagnosis not present

## 2018-12-01 DIAGNOSIS — A4150 Gram-negative sepsis, unspecified: Secondary | ICD-10-CM | POA: Diagnosis not present

## 2018-12-01 DIAGNOSIS — I5082 Biventricular heart failure: Secondary | ICD-10-CM | POA: Diagnosis not present

## 2018-12-01 DIAGNOSIS — I132 Hypertensive heart and chronic kidney disease with heart failure and with stage 5 chronic kidney disease, or end stage renal disease: Secondary | ICD-10-CM | POA: Diagnosis not present

## 2018-12-02 DIAGNOSIS — E119 Type 2 diabetes mellitus without complications: Secondary | ICD-10-CM | POA: Diagnosis not present

## 2018-12-02 DIAGNOSIS — D631 Anemia in chronic kidney disease: Secondary | ICD-10-CM | POA: Diagnosis not present

## 2018-12-02 DIAGNOSIS — N2581 Secondary hyperparathyroidism of renal origin: Secondary | ICD-10-CM | POA: Diagnosis not present

## 2018-12-02 DIAGNOSIS — Z23 Encounter for immunization: Secondary | ICD-10-CM | POA: Diagnosis not present

## 2018-12-02 DIAGNOSIS — N186 End stage renal disease: Secondary | ICD-10-CM | POA: Diagnosis not present

## 2018-12-02 DIAGNOSIS — K8309 Other cholangitis: Secondary | ICD-10-CM | POA: Diagnosis not present

## 2018-12-02 DIAGNOSIS — D509 Iron deficiency anemia, unspecified: Secondary | ICD-10-CM | POA: Diagnosis not present

## 2018-12-03 ENCOUNTER — Other Ambulatory Visit: Payer: Self-pay | Admitting: Internal Medicine

## 2018-12-04 DIAGNOSIS — N186 End stage renal disease: Secondary | ICD-10-CM | POA: Diagnosis not present

## 2018-12-04 DIAGNOSIS — K8309 Other cholangitis: Secondary | ICD-10-CM | POA: Diagnosis not present

## 2018-12-04 DIAGNOSIS — D509 Iron deficiency anemia, unspecified: Secondary | ICD-10-CM | POA: Diagnosis not present

## 2018-12-04 DIAGNOSIS — E119 Type 2 diabetes mellitus without complications: Secondary | ICD-10-CM | POA: Diagnosis not present

## 2018-12-04 DIAGNOSIS — N2581 Secondary hyperparathyroidism of renal origin: Secondary | ICD-10-CM | POA: Diagnosis not present

## 2018-12-04 DIAGNOSIS — D631 Anemia in chronic kidney disease: Secondary | ICD-10-CM | POA: Diagnosis not present

## 2018-12-05 DIAGNOSIS — A4150 Gram-negative sepsis, unspecified: Secondary | ICD-10-CM | POA: Diagnosis not present

## 2018-12-05 DIAGNOSIS — I5042 Chronic combined systolic (congestive) and diastolic (congestive) heart failure: Secondary | ICD-10-CM | POA: Diagnosis not present

## 2018-12-05 DIAGNOSIS — A0472 Enterocolitis due to Clostridium difficile, not specified as recurrent: Secondary | ICD-10-CM | POA: Diagnosis not present

## 2018-12-05 DIAGNOSIS — I5082 Biventricular heart failure: Secondary | ICD-10-CM | POA: Diagnosis not present

## 2018-12-05 DIAGNOSIS — I132 Hypertensive heart and chronic kidney disease with heart failure and with stage 5 chronic kidney disease, or end stage renal disease: Secondary | ICD-10-CM | POA: Diagnosis not present

## 2018-12-05 DIAGNOSIS — E1122 Type 2 diabetes mellitus with diabetic chronic kidney disease: Secondary | ICD-10-CM | POA: Diagnosis not present

## 2018-12-06 DIAGNOSIS — E119 Type 2 diabetes mellitus without complications: Secondary | ICD-10-CM | POA: Diagnosis not present

## 2018-12-06 DIAGNOSIS — D631 Anemia in chronic kidney disease: Secondary | ICD-10-CM | POA: Diagnosis not present

## 2018-12-06 DIAGNOSIS — N186 End stage renal disease: Secondary | ICD-10-CM | POA: Diagnosis not present

## 2018-12-06 DIAGNOSIS — K8309 Other cholangitis: Secondary | ICD-10-CM | POA: Diagnosis not present

## 2018-12-06 DIAGNOSIS — D509 Iron deficiency anemia, unspecified: Secondary | ICD-10-CM | POA: Diagnosis not present

## 2018-12-06 DIAGNOSIS — N2581 Secondary hyperparathyroidism of renal origin: Secondary | ICD-10-CM | POA: Diagnosis not present

## 2018-12-08 DIAGNOSIS — I5042 Chronic combined systolic (congestive) and diastolic (congestive) heart failure: Secondary | ICD-10-CM | POA: Diagnosis not present

## 2018-12-08 DIAGNOSIS — M109 Gout, unspecified: Secondary | ICD-10-CM

## 2018-12-08 DIAGNOSIS — A4150 Gram-negative sepsis, unspecified: Secondary | ICD-10-CM | POA: Diagnosis not present

## 2018-12-08 DIAGNOSIS — I255 Ischemic cardiomyopathy: Secondary | ICD-10-CM

## 2018-12-08 DIAGNOSIS — F329 Major depressive disorder, single episode, unspecified: Secondary | ICD-10-CM

## 2018-12-08 DIAGNOSIS — E1151 Type 2 diabetes mellitus with diabetic peripheral angiopathy without gangrene: Secondary | ICD-10-CM

## 2018-12-08 DIAGNOSIS — M48062 Spinal stenosis, lumbar region with neurogenic claudication: Secondary | ICD-10-CM

## 2018-12-08 DIAGNOSIS — Z9181 History of falling: Secondary | ICD-10-CM

## 2018-12-08 DIAGNOSIS — Z48815 Encounter for surgical aftercare following surgery on the digestive system: Secondary | ICD-10-CM | POA: Diagnosis not present

## 2018-12-08 DIAGNOSIS — I5082 Biventricular heart failure: Secondary | ICD-10-CM | POA: Diagnosis not present

## 2018-12-08 DIAGNOSIS — T82898D Other specified complication of vascular prosthetic devices, implants and grafts, subsequent encounter: Secondary | ICD-10-CM | POA: Diagnosis not present

## 2018-12-08 DIAGNOSIS — M5412 Radiculopathy, cervical region: Secondary | ICD-10-CM

## 2018-12-08 DIAGNOSIS — Z8673 Personal history of transient ischemic attack (TIA), and cerebral infarction without residual deficits: Secondary | ICD-10-CM

## 2018-12-08 DIAGNOSIS — Z96651 Presence of right artificial knee joint: Secondary | ICD-10-CM

## 2018-12-08 DIAGNOSIS — I132 Hypertensive heart and chronic kidney disease with heart failure and with stage 5 chronic kidney disease, or end stage renal disease: Secondary | ICD-10-CM

## 2018-12-08 DIAGNOSIS — M1991 Primary osteoarthritis, unspecified site: Secondary | ICD-10-CM

## 2018-12-08 DIAGNOSIS — E039 Hypothyroidism, unspecified: Secondary | ICD-10-CM

## 2018-12-08 DIAGNOSIS — I2510 Atherosclerotic heart disease of native coronary artery without angina pectoris: Secondary | ICD-10-CM | POA: Diagnosis not present

## 2018-12-08 DIAGNOSIS — B179 Acute viral hepatitis, unspecified: Secondary | ICD-10-CM

## 2018-12-08 DIAGNOSIS — Z7982 Long term (current) use of aspirin: Secondary | ICD-10-CM

## 2018-12-08 DIAGNOSIS — Z8546 Personal history of malignant neoplasm of prostate: Secondary | ICD-10-CM

## 2018-12-08 DIAGNOSIS — Z87891 Personal history of nicotine dependence: Secondary | ICD-10-CM

## 2018-12-08 DIAGNOSIS — J449 Chronic obstructive pulmonary disease, unspecified: Secondary | ICD-10-CM

## 2018-12-08 DIAGNOSIS — A0472 Enterocolitis due to Clostridium difficile, not specified as recurrent: Secondary | ICD-10-CM | POA: Diagnosis not present

## 2018-12-08 DIAGNOSIS — F0280 Dementia in other diseases classified elsewhere without behavioral disturbance: Secondary | ICD-10-CM

## 2018-12-08 DIAGNOSIS — D631 Anemia in chronic kidney disease: Secondary | ICD-10-CM | POA: Diagnosis not present

## 2018-12-08 DIAGNOSIS — Z9861 Coronary angioplasty status: Secondary | ICD-10-CM

## 2018-12-08 DIAGNOSIS — E1142 Type 2 diabetes mellitus with diabetic polyneuropathy: Secondary | ICD-10-CM

## 2018-12-08 DIAGNOSIS — N186 End stage renal disease: Secondary | ICD-10-CM | POA: Diagnosis not present

## 2018-12-08 DIAGNOSIS — B9689 Other specified bacterial agents as the cause of diseases classified elsewhere: Secondary | ICD-10-CM | POA: Diagnosis not present

## 2018-12-08 DIAGNOSIS — N400 Enlarged prostate without lower urinary tract symptoms: Secondary | ICD-10-CM

## 2018-12-08 DIAGNOSIS — Z8701 Personal history of pneumonia (recurrent): Secondary | ICD-10-CM

## 2018-12-08 DIAGNOSIS — Z86010 Personal history of colonic polyps: Secondary | ICD-10-CM

## 2018-12-08 DIAGNOSIS — I252 Old myocardial infarction: Secondary | ICD-10-CM

## 2018-12-08 DIAGNOSIS — R1310 Dysphagia, unspecified: Secondary | ICD-10-CM

## 2018-12-08 DIAGNOSIS — E1122 Type 2 diabetes mellitus with diabetic chronic kidney disease: Secondary | ICD-10-CM

## 2018-12-09 ENCOUNTER — Telehealth: Payer: Self-pay

## 2018-12-09 DIAGNOSIS — E119 Type 2 diabetes mellitus without complications: Secondary | ICD-10-CM | POA: Diagnosis not present

## 2018-12-09 DIAGNOSIS — D631 Anemia in chronic kidney disease: Secondary | ICD-10-CM | POA: Diagnosis not present

## 2018-12-09 DIAGNOSIS — N186 End stage renal disease: Secondary | ICD-10-CM | POA: Diagnosis not present

## 2018-12-09 DIAGNOSIS — D509 Iron deficiency anemia, unspecified: Secondary | ICD-10-CM | POA: Diagnosis not present

## 2018-12-09 DIAGNOSIS — K8309 Other cholangitis: Secondary | ICD-10-CM | POA: Diagnosis not present

## 2018-12-09 DIAGNOSIS — N2581 Secondary hyperparathyroidism of renal origin: Secondary | ICD-10-CM | POA: Diagnosis not present

## 2018-12-09 NOTE — Telephone Encounter (Signed)
Called and left msg regarding pass needing a call back concerning prrof of income for the pt.

## 2018-12-10 DIAGNOSIS — E1122 Type 2 diabetes mellitus with diabetic chronic kidney disease: Secondary | ICD-10-CM | POA: Diagnosis not present

## 2018-12-10 DIAGNOSIS — A4150 Gram-negative sepsis, unspecified: Secondary | ICD-10-CM | POA: Diagnosis not present

## 2018-12-10 DIAGNOSIS — I5082 Biventricular heart failure: Secondary | ICD-10-CM | POA: Diagnosis not present

## 2018-12-10 DIAGNOSIS — I132 Hypertensive heart and chronic kidney disease with heart failure and with stage 5 chronic kidney disease, or end stage renal disease: Secondary | ICD-10-CM | POA: Diagnosis not present

## 2018-12-10 DIAGNOSIS — I5042 Chronic combined systolic (congestive) and diastolic (congestive) heart failure: Secondary | ICD-10-CM | POA: Diagnosis not present

## 2018-12-10 DIAGNOSIS — A0472 Enterocolitis due to Clostridium difficile, not specified as recurrent: Secondary | ICD-10-CM | POA: Diagnosis not present

## 2018-12-11 DIAGNOSIS — E119 Type 2 diabetes mellitus without complications: Secondary | ICD-10-CM | POA: Diagnosis not present

## 2018-12-11 DIAGNOSIS — K8309 Other cholangitis: Secondary | ICD-10-CM | POA: Diagnosis not present

## 2018-12-11 DIAGNOSIS — N186 End stage renal disease: Secondary | ICD-10-CM | POA: Diagnosis not present

## 2018-12-11 DIAGNOSIS — D509 Iron deficiency anemia, unspecified: Secondary | ICD-10-CM | POA: Diagnosis not present

## 2018-12-11 DIAGNOSIS — D631 Anemia in chronic kidney disease: Secondary | ICD-10-CM | POA: Diagnosis not present

## 2018-12-11 DIAGNOSIS — N2581 Secondary hyperparathyroidism of renal origin: Secondary | ICD-10-CM | POA: Diagnosis not present

## 2018-12-12 ENCOUNTER — Telehealth: Payer: Self-pay | Admitting: Internal Medicine

## 2018-12-12 ENCOUNTER — Other Ambulatory Visit: Payer: Self-pay | Admitting: Internal Medicine

## 2018-12-12 NOTE — Telephone Encounter (Signed)
Called Katie no answer LMOM w/MD verbal../lmb

## 2018-12-12 NOTE — Telephone Encounter (Signed)
Copied from [place] [id number]. Topic: Quick Communication - Home Health Verbal Orders >> Dec 12, 2018  1:40 PM Sheran Luz wrote: Caller/Agency: [name] with [place] Number: [phone] Requesting PT  Frequency: [phone]

## 2018-12-12 NOTE — Telephone Encounter (Signed)
Ok for verbals 

## 2018-12-13 DIAGNOSIS — N2581 Secondary hyperparathyroidism of renal origin: Secondary | ICD-10-CM | POA: Diagnosis not present

## 2018-12-13 DIAGNOSIS — E119 Type 2 diabetes mellitus without complications: Secondary | ICD-10-CM | POA: Diagnosis not present

## 2018-12-13 DIAGNOSIS — D509 Iron deficiency anemia, unspecified: Secondary | ICD-10-CM | POA: Diagnosis not present

## 2018-12-13 DIAGNOSIS — D631 Anemia in chronic kidney disease: Secondary | ICD-10-CM | POA: Diagnosis not present

## 2018-12-13 DIAGNOSIS — K8309 Other cholangitis: Secondary | ICD-10-CM | POA: Diagnosis not present

## 2018-12-13 DIAGNOSIS — N186 End stage renal disease: Secondary | ICD-10-CM | POA: Diagnosis not present

## 2018-12-15 DIAGNOSIS — E1122 Type 2 diabetes mellitus with diabetic chronic kidney disease: Secondary | ICD-10-CM | POA: Diagnosis not present

## 2018-12-15 DIAGNOSIS — J449 Chronic obstructive pulmonary disease, unspecified: Secondary | ICD-10-CM | POA: Diagnosis not present

## 2018-12-15 DIAGNOSIS — I5042 Chronic combined systolic (congestive) and diastolic (congestive) heart failure: Secondary | ICD-10-CM | POA: Diagnosis not present

## 2018-12-15 DIAGNOSIS — I132 Hypertensive heart and chronic kidney disease with heart failure and with stage 5 chronic kidney disease, or end stage renal disease: Secondary | ICD-10-CM | POA: Diagnosis not present

## 2018-12-15 DIAGNOSIS — E1151 Type 2 diabetes mellitus with diabetic peripheral angiopathy without gangrene: Secondary | ICD-10-CM | POA: Diagnosis not present

## 2018-12-15 DIAGNOSIS — T82898D Other specified complication of vascular prosthetic devices, implants and grafts, subsequent encounter: Secondary | ICD-10-CM | POA: Diagnosis not present

## 2018-12-15 DIAGNOSIS — R1310 Dysphagia, unspecified: Secondary | ICD-10-CM | POA: Diagnosis not present

## 2018-12-15 DIAGNOSIS — F0280 Dementia in other diseases classified elsewhere without behavioral disturbance: Secondary | ICD-10-CM | POA: Diagnosis not present

## 2018-12-15 DIAGNOSIS — I2510 Atherosclerotic heart disease of native coronary artery without angina pectoris: Secondary | ICD-10-CM | POA: Diagnosis not present

## 2018-12-15 DIAGNOSIS — A0472 Enterocolitis due to Clostridium difficile, not specified as recurrent: Secondary | ICD-10-CM | POA: Diagnosis not present

## 2018-12-15 DIAGNOSIS — I255 Ischemic cardiomyopathy: Secondary | ICD-10-CM | POA: Diagnosis not present

## 2018-12-15 DIAGNOSIS — I5082 Biventricular heart failure: Secondary | ICD-10-CM | POA: Diagnosis not present

## 2018-12-15 DIAGNOSIS — M48062 Spinal stenosis, lumbar region with neurogenic claudication: Secondary | ICD-10-CM | POA: Diagnosis not present

## 2018-12-15 DIAGNOSIS — Z48815 Encounter for surgical aftercare following surgery on the digestive system: Secondary | ICD-10-CM | POA: Diagnosis not present

## 2018-12-15 DIAGNOSIS — B9689 Other specified bacterial agents as the cause of diseases classified elsewhere: Secondary | ICD-10-CM | POA: Diagnosis not present

## 2018-12-15 DIAGNOSIS — D631 Anemia in chronic kidney disease: Secondary | ICD-10-CM | POA: Diagnosis not present

## 2018-12-15 DIAGNOSIS — F329 Major depressive disorder, single episode, unspecified: Secondary | ICD-10-CM | POA: Diagnosis not present

## 2018-12-15 DIAGNOSIS — M5412 Radiculopathy, cervical region: Secondary | ICD-10-CM | POA: Diagnosis not present

## 2018-12-15 DIAGNOSIS — N186 End stage renal disease: Secondary | ICD-10-CM | POA: Diagnosis not present

## 2018-12-15 DIAGNOSIS — N400 Enlarged prostate without lower urinary tract symptoms: Secondary | ICD-10-CM | POA: Diagnosis not present

## 2018-12-15 DIAGNOSIS — M109 Gout, unspecified: Secondary | ICD-10-CM | POA: Diagnosis not present

## 2018-12-15 DIAGNOSIS — E039 Hypothyroidism, unspecified: Secondary | ICD-10-CM | POA: Diagnosis not present

## 2018-12-15 DIAGNOSIS — M1991 Primary osteoarthritis, unspecified site: Secondary | ICD-10-CM | POA: Diagnosis not present

## 2018-12-15 DIAGNOSIS — E1142 Type 2 diabetes mellitus with diabetic polyneuropathy: Secondary | ICD-10-CM | POA: Diagnosis not present

## 2018-12-15 DIAGNOSIS — B179 Acute viral hepatitis, unspecified: Secondary | ICD-10-CM | POA: Diagnosis not present

## 2018-12-16 DIAGNOSIS — K8309 Other cholangitis: Secondary | ICD-10-CM | POA: Diagnosis not present

## 2018-12-16 DIAGNOSIS — N2581 Secondary hyperparathyroidism of renal origin: Secondary | ICD-10-CM | POA: Diagnosis not present

## 2018-12-16 DIAGNOSIS — D509 Iron deficiency anemia, unspecified: Secondary | ICD-10-CM | POA: Diagnosis not present

## 2018-12-16 DIAGNOSIS — N186 End stage renal disease: Secondary | ICD-10-CM | POA: Diagnosis not present

## 2018-12-16 DIAGNOSIS — D631 Anemia in chronic kidney disease: Secondary | ICD-10-CM | POA: Diagnosis not present

## 2018-12-16 DIAGNOSIS — E119 Type 2 diabetes mellitus without complications: Secondary | ICD-10-CM | POA: Diagnosis not present

## 2018-12-17 ENCOUNTER — Other Ambulatory Visit: Payer: Self-pay | Admitting: *Deleted

## 2018-12-17 DIAGNOSIS — Z48815 Encounter for surgical aftercare following surgery on the digestive system: Secondary | ICD-10-CM | POA: Diagnosis not present

## 2018-12-17 DIAGNOSIS — I132 Hypertensive heart and chronic kidney disease with heart failure and with stage 5 chronic kidney disease, or end stage renal disease: Secondary | ICD-10-CM | POA: Diagnosis not present

## 2018-12-17 DIAGNOSIS — B9689 Other specified bacterial agents as the cause of diseases classified elsewhere: Secondary | ICD-10-CM | POA: Diagnosis not present

## 2018-12-17 DIAGNOSIS — T82898D Other specified complication of vascular prosthetic devices, implants and grafts, subsequent encounter: Secondary | ICD-10-CM | POA: Diagnosis not present

## 2018-12-17 DIAGNOSIS — A0472 Enterocolitis due to Clostridium difficile, not specified as recurrent: Secondary | ICD-10-CM | POA: Diagnosis not present

## 2018-12-17 DIAGNOSIS — I5042 Chronic combined systolic (congestive) and diastolic (congestive) heart failure: Secondary | ICD-10-CM | POA: Diagnosis not present

## 2018-12-17 NOTE — Patient Outreach (Signed)
[name] [place]) Care Management  12/17/2018  [name] 04-01-1951 [id number]   Call placed to follow up on member's current medical status, no answer.  HIPAA compliant voice message left for wife, will follow up within the next 3-4 business days.  [name], [place], MSN [place] [phone]

## 2018-12-18 DIAGNOSIS — K8309 Other cholangitis: Secondary | ICD-10-CM | POA: Diagnosis not present

## 2018-12-18 DIAGNOSIS — N2581 Secondary hyperparathyroidism of renal origin: Secondary | ICD-10-CM | POA: Diagnosis not present

## 2018-12-18 DIAGNOSIS — N186 End stage renal disease: Secondary | ICD-10-CM | POA: Diagnosis not present

## 2018-12-18 DIAGNOSIS — D631 Anemia in chronic kidney disease: Secondary | ICD-10-CM | POA: Diagnosis not present

## 2018-12-18 DIAGNOSIS — E119 Type 2 diabetes mellitus without complications: Secondary | ICD-10-CM | POA: Diagnosis not present

## 2018-12-18 DIAGNOSIS — D509 Iron deficiency anemia, unspecified: Secondary | ICD-10-CM | POA: Diagnosis not present

## 2018-12-20 DIAGNOSIS — E119 Type 2 diabetes mellitus without complications: Secondary | ICD-10-CM | POA: Diagnosis not present

## 2018-12-20 DIAGNOSIS — D631 Anemia in chronic kidney disease: Secondary | ICD-10-CM | POA: Diagnosis not present

## 2018-12-20 DIAGNOSIS — D509 Iron deficiency anemia, unspecified: Secondary | ICD-10-CM | POA: Diagnosis not present

## 2018-12-20 DIAGNOSIS — N186 End stage renal disease: Secondary | ICD-10-CM | POA: Diagnosis not present

## 2018-12-20 DIAGNOSIS — K8309 Other cholangitis: Secondary | ICD-10-CM | POA: Diagnosis not present

## 2018-12-20 DIAGNOSIS — N2581 Secondary hyperparathyroidism of renal origin: Secondary | ICD-10-CM | POA: Diagnosis not present

## 2018-12-22 DIAGNOSIS — I5042 Chronic combined systolic (congestive) and diastolic (congestive) heart failure: Secondary | ICD-10-CM | POA: Diagnosis not present

## 2018-12-22 DIAGNOSIS — Z48815 Encounter for surgical aftercare following surgery on the digestive system: Secondary | ICD-10-CM | POA: Diagnosis not present

## 2018-12-22 DIAGNOSIS — A0472 Enterocolitis due to Clostridium difficile, not specified as recurrent: Secondary | ICD-10-CM | POA: Diagnosis not present

## 2018-12-22 DIAGNOSIS — I132 Hypertensive heart and chronic kidney disease with heart failure and with stage 5 chronic kidney disease, or end stage renal disease: Secondary | ICD-10-CM | POA: Diagnosis not present

## 2018-12-22 DIAGNOSIS — B9689 Other specified bacterial agents as the cause of diseases classified elsewhere: Secondary | ICD-10-CM | POA: Diagnosis not present

## 2018-12-22 DIAGNOSIS — T82898D Other specified complication of vascular prosthetic devices, implants and grafts, subsequent encounter: Secondary | ICD-10-CM | POA: Diagnosis not present

## 2018-12-23 ENCOUNTER — Other Ambulatory Visit: Payer: Self-pay | Admitting: *Deleted

## 2018-12-23 DIAGNOSIS — N186 End stage renal disease: Secondary | ICD-10-CM | POA: Diagnosis not present

## 2018-12-23 DIAGNOSIS — N2581 Secondary hyperparathyroidism of renal origin: Secondary | ICD-10-CM | POA: Diagnosis not present

## 2018-12-23 DIAGNOSIS — K8309 Other cholangitis: Secondary | ICD-10-CM | POA: Diagnosis not present

## 2018-12-23 DIAGNOSIS — E119 Type 2 diabetes mellitus without complications: Secondary | ICD-10-CM | POA: Diagnosis not present

## 2018-12-23 DIAGNOSIS — D631 Anemia in chronic kidney disease: Secondary | ICD-10-CM | POA: Diagnosis not present

## 2018-12-23 DIAGNOSIS — D509 Iron deficiency anemia, unspecified: Secondary | ICD-10-CM | POA: Diagnosis not present

## 2018-12-23 NOTE — Patient Outreach (Signed)
[place] [facility]) Care Management  12/23/2018  WHITAKER HOLDERMAN 1950-10-16 [id number]   Call placed to member to follow up on current medical condition, no answer.  Second consecutive unsuccessful outreach attempt.  HIPAA compliant voice message left, unsuccessful outreach letter sent.  Will follow up within the next 3-4 business days.  [name], [place], MSN [place] [phone]

## 2018-12-24 DIAGNOSIS — A0472 Enterocolitis due to Clostridium difficile, not specified as recurrent: Secondary | ICD-10-CM | POA: Diagnosis not present

## 2018-12-24 DIAGNOSIS — B9689 Other specified bacterial agents as the cause of diseases classified elsewhere: Secondary | ICD-10-CM | POA: Diagnosis not present

## 2018-12-24 DIAGNOSIS — T82898D Other specified complication of vascular prosthetic devices, implants and grafts, subsequent encounter: Secondary | ICD-10-CM | POA: Diagnosis not present

## 2018-12-24 DIAGNOSIS — I5042 Chronic combined systolic (congestive) and diastolic (congestive) heart failure: Secondary | ICD-10-CM | POA: Diagnosis not present

## 2018-12-24 DIAGNOSIS — I132 Hypertensive heart and chronic kidney disease with heart failure and with stage 5 chronic kidney disease, or end stage renal disease: Secondary | ICD-10-CM | POA: Diagnosis not present

## 2018-12-24 DIAGNOSIS — Z48815 Encounter for surgical aftercare following surgery on the digestive system: Secondary | ICD-10-CM | POA: Diagnosis not present

## 2018-12-25 DIAGNOSIS — D631 Anemia in chronic kidney disease: Secondary | ICD-10-CM | POA: Diagnosis not present

## 2018-12-25 DIAGNOSIS — K8309 Other cholangitis: Secondary | ICD-10-CM | POA: Diagnosis not present

## 2018-12-25 DIAGNOSIS — D509 Iron deficiency anemia, unspecified: Secondary | ICD-10-CM | POA: Diagnosis not present

## 2018-12-25 DIAGNOSIS — N186 End stage renal disease: Secondary | ICD-10-CM | POA: Diagnosis not present

## 2018-12-25 DIAGNOSIS — E119 Type 2 diabetes mellitus without complications: Secondary | ICD-10-CM | POA: Diagnosis not present

## 2018-12-25 DIAGNOSIS — N2581 Secondary hyperparathyroidism of renal origin: Secondary | ICD-10-CM | POA: Diagnosis not present

## 2018-12-27 DIAGNOSIS — N2581 Secondary hyperparathyroidism of renal origin: Secondary | ICD-10-CM | POA: Diagnosis not present

## 2018-12-27 DIAGNOSIS — D509 Iron deficiency anemia, unspecified: Secondary | ICD-10-CM | POA: Diagnosis not present

## 2018-12-27 DIAGNOSIS — N186 End stage renal disease: Secondary | ICD-10-CM | POA: Diagnosis not present

## 2018-12-27 DIAGNOSIS — D631 Anemia in chronic kidney disease: Secondary | ICD-10-CM | POA: Diagnosis not present

## 2018-12-27 DIAGNOSIS — E119 Type 2 diabetes mellitus without complications: Secondary | ICD-10-CM | POA: Diagnosis not present

## 2018-12-27 DIAGNOSIS — K8309 Other cholangitis: Secondary | ICD-10-CM | POA: Diagnosis not present

## 2018-12-29 ENCOUNTER — Other Ambulatory Visit: Payer: Self-pay | Admitting: *Deleted

## 2018-12-29 DIAGNOSIS — A0472 Enterocolitis due to Clostridium difficile, not specified as recurrent: Secondary | ICD-10-CM | POA: Diagnosis not present

## 2018-12-29 DIAGNOSIS — I5042 Chronic combined systolic (congestive) and diastolic (congestive) heart failure: Secondary | ICD-10-CM | POA: Diagnosis not present

## 2018-12-29 DIAGNOSIS — B9689 Other specified bacterial agents as the cause of diseases classified elsewhere: Secondary | ICD-10-CM | POA: Diagnosis not present

## 2018-12-29 DIAGNOSIS — T82898D Other specified complication of vascular prosthetic devices, implants and grafts, subsequent encounter: Secondary | ICD-10-CM | POA: Diagnosis not present

## 2018-12-29 DIAGNOSIS — I132 Hypertensive heart and chronic kidney disease with heart failure and with stage 5 chronic kidney disease, or end stage renal disease: Secondary | ICD-10-CM | POA: Diagnosis not present

## 2018-12-29 DIAGNOSIS — Z48815 Encounter for surgical aftercare following surgery on the digestive system: Secondary | ICD-10-CM | POA: Diagnosis not present

## 2018-12-29 NOTE — Patient Outreach (Addendum)
[place] [facility]) Care Management  12/29/2018  [name] [birth year] [id number]   Third consecutive attempt made to contact member/wife for follow up telephone assessment of current medical condition.  No answer, unable to leave voice message as mailbox is full.  If no call back by 4/1 will close case due to inability to maintain contact.    Update @ 1145:  Call received back from member's wife [name].  She report member is doing "fairly well."  State he continues to have some good days as well as some bad days.  He continues to have HD 3 days a week as well as sessions with home health for PT every week.  Report his blood pressure dropped last week (80/40), asymptomatic.  State she gave member foods with increased salt and increased his fluid intake for that day.  She feel this was associated with his dialysis treatment.  She again voices how overwhelmed she is caring for herself, the member, and their adult son, whom she report now is unable to walk.  This care manager inquired about willingness of member to now accept outside assistance to care for him, she state he continues to refuse, stating they don't need the help.  This care manager expressed concern regarding wife's burnout and potential risk for her own health, which could result in lack of someone in the home to care for him.  She agrees that she is in need of help and is willing to discuss her concern with member.  If he agrees to increase support in the home, she will notify this care manager and new referral will be placed to social worker for resources.  Will follow up with wife within the next 2 weeks.    [name], [place], MSN [place] [phone]

## 2018-12-30 DIAGNOSIS — N2581 Secondary hyperparathyroidism of renal origin: Secondary | ICD-10-CM | POA: Diagnosis not present

## 2018-12-30 DIAGNOSIS — D509 Iron deficiency anemia, unspecified: Secondary | ICD-10-CM | POA: Diagnosis not present

## 2018-12-30 DIAGNOSIS — K8309 Other cholangitis: Secondary | ICD-10-CM | POA: Diagnosis not present

## 2018-12-30 DIAGNOSIS — E119 Type 2 diabetes mellitus without complications: Secondary | ICD-10-CM | POA: Diagnosis not present

## 2018-12-30 DIAGNOSIS — D631 Anemia in chronic kidney disease: Secondary | ICD-10-CM | POA: Diagnosis not present

## 2018-12-30 DIAGNOSIS — N186 End stage renal disease: Secondary | ICD-10-CM | POA: Diagnosis not present

## 2018-12-31 ENCOUNTER — Telehealth: Payer: Self-pay | Admitting: Cardiovascular Disease

## 2018-12-31 DIAGNOSIS — N2581 Secondary hyperparathyroidism of renal origin: Secondary | ICD-10-CM | POA: Diagnosis not present

## 2018-12-31 DIAGNOSIS — B9689 Other specified bacterial agents as the cause of diseases classified elsewhere: Secondary | ICD-10-CM | POA: Diagnosis not present

## 2018-12-31 DIAGNOSIS — Z23 Encounter for immunization: Secondary | ICD-10-CM | POA: Diagnosis not present

## 2018-12-31 DIAGNOSIS — I132 Hypertensive heart and chronic kidney disease with heart failure and with stage 5 chronic kidney disease, or end stage renal disease: Secondary | ICD-10-CM | POA: Diagnosis not present

## 2018-12-31 DIAGNOSIS — Z48815 Encounter for surgical aftercare following surgery on the digestive system: Secondary | ICD-10-CM | POA: Diagnosis not present

## 2018-12-31 DIAGNOSIS — I5042 Chronic combined systolic (congestive) and diastolic (congestive) heart failure: Secondary | ICD-10-CM | POA: Diagnosis not present

## 2018-12-31 DIAGNOSIS — N186 End stage renal disease: Secondary | ICD-10-CM | POA: Diagnosis not present

## 2018-12-31 DIAGNOSIS — E1129 Type 2 diabetes mellitus with other diabetic kidney complication: Secondary | ICD-10-CM | POA: Diagnosis not present

## 2018-12-31 DIAGNOSIS — D631 Anemia in chronic kidney disease: Secondary | ICD-10-CM | POA: Diagnosis not present

## 2018-12-31 DIAGNOSIS — A0472 Enterocolitis due to Clostridium difficile, not specified as recurrent: Secondary | ICD-10-CM | POA: Diagnosis not present

## 2018-12-31 DIAGNOSIS — E119 Type 2 diabetes mellitus without complications: Secondary | ICD-10-CM | POA: Diagnosis not present

## 2018-12-31 DIAGNOSIS — Z992 Dependence on renal dialysis: Secondary | ICD-10-CM | POA: Diagnosis not present

## 2018-12-31 DIAGNOSIS — T82898D Other specified complication of vascular prosthetic devices, implants and grafts, subsequent encounter: Secondary | ICD-10-CM | POA: Diagnosis not present

## 2018-12-31 NOTE — Telephone Encounter (Signed)
Called and spoke w/ the pt's wife regarding the proof of insurance information and that she can come to the ch st building let the people know downstairs that she is there to get copies made of proof income (she will keep originals) so that we may fax it in to the PASS program, and she also needs a sample of praluent 150 if we can give it to her. Will route to the pharmd to make them aware and to check if they have the samples readily available. I also instructed the pt to not come inside and that we will bring it down to maintain precautions during this pandemic.

## 2018-12-31 NOTE — Telephone Encounter (Signed)
New Message            Patient's wife is calling today to get information sent to this Pass program for the praluent injections for her husband. Patient is asking if she can bring in his proof of income and have it faxed to the program, patient does not have any other way to get this faxed to do [place] and other places being closed for the virus. Pls call to advise.

## 2019-01-01 ENCOUNTER — Other Ambulatory Visit: Payer: Self-pay | Admitting: Pharmacy Technician

## 2019-01-01 ENCOUNTER — Other Ambulatory Visit: Payer: Self-pay | Admitting: Internal Medicine

## 2019-01-01 DIAGNOSIS — N2581 Secondary hyperparathyroidism of renal origin: Secondary | ICD-10-CM | POA: Diagnosis not present

## 2019-01-01 DIAGNOSIS — N186 End stage renal disease: Secondary | ICD-10-CM | POA: Diagnosis not present

## 2019-01-01 DIAGNOSIS — Z23 Encounter for immunization: Secondary | ICD-10-CM | POA: Diagnosis not present

## 2019-01-01 DIAGNOSIS — D631 Anemia in chronic kidney disease: Secondary | ICD-10-CM | POA: Diagnosis not present

## 2019-01-01 DIAGNOSIS — E119 Type 2 diabetes mellitus without complications: Secondary | ICD-10-CM | POA: Diagnosis not present

## 2019-01-01 NOTE — Patient Outreach (Signed)
[place] [facility]) Care Management  01/01/2019  [name] April 13, 1951 [id number]   Incoming call from patient wife requesting patient assistance for Zetia.  Created Pharmacy referral  Maud Deed. Chana Bode [place] Certified Pharmacy Technician [place] Management Direct Dial:[phone]

## 2019-01-02 ENCOUNTER — Other Ambulatory Visit: Payer: Self-pay | Admitting: Pharmacist

## 2019-01-02 NOTE — Patient Outreach (Addendum)
[name]) Care Management  [place]  01/02/2019  ISAHIA HOLLERBACH 07-29-1951 [id number]   Reason for referral: Medication Assistance with Zetia  Referral source: [place]  Reason for call: Medication Assistance with Zetia Prescription  Outreach:  Outreach call to patient's phone number. Individual answering the phone asks that I call back later in the afternoon.  Note that patient's wife, Alanson Hausmann (listed on DPR in patient chart) has previously spoken with [facility] CPhT regarding medication assistance for Zetia. Application mailed to patient in [id number], but application process not completed.  Outreach to patient again. Speak with Mrs. Winsor. HIPAA identifiers verified. Reports that the patient received medication assistance from [employer] for [place] last year and is interested in applying again this year.   Patient describes how she has sometimes felt overwhelmed with caring for the patient. Per note in chart from [place], she has discussed this concern with Mrs. Irigoyen as well as possible assistance options. Mrs. lucas winograd understanding of options that she discussed with [facility] as well as with a Education officer, museum. States that she will follow up with Brayton Layman and Education officer, museum as needed. Confirm that patient has [facility] 24 hour nurse advice line phone number.  Medication Assistance Findings:  Medication assistance needs identified.   Extra Help:  Not eligible for Extra Help Low Income Subsidy based on reported income and assets  Patient Assistance Programs: Zetia made by [employer] o Income requirement met: Yes o Out-of-pocket prescription expenditure met:   Not Applicable - Patient has met application requirements to apply for this patient assistance program.     Plan: . I will route patient assistance letter to [place] technician who will coordinate patient assistance program application process for medications listed above.  [facility]  pharmacy technician will assist with obtaining all required documents from both patient and provider and submit application once completed.    Harlow Asa, PharmD, [place] Management [phone]

## 2019-01-03 DIAGNOSIS — Z23 Encounter for immunization: Secondary | ICD-10-CM | POA: Diagnosis not present

## 2019-01-03 DIAGNOSIS — N2581 Secondary hyperparathyroidism of renal origin: Secondary | ICD-10-CM | POA: Diagnosis not present

## 2019-01-03 DIAGNOSIS — E119 Type 2 diabetes mellitus without complications: Secondary | ICD-10-CM | POA: Diagnosis not present

## 2019-01-03 DIAGNOSIS — N186 End stage renal disease: Secondary | ICD-10-CM | POA: Diagnosis not present

## 2019-01-03 DIAGNOSIS — D631 Anemia in chronic kidney disease: Secondary | ICD-10-CM | POA: Diagnosis not present

## 2019-01-05 DIAGNOSIS — B9689 Other specified bacterial agents as the cause of diseases classified elsewhere: Secondary | ICD-10-CM | POA: Diagnosis not present

## 2019-01-05 DIAGNOSIS — A0472 Enterocolitis due to Clostridium difficile, not specified as recurrent: Secondary | ICD-10-CM | POA: Diagnosis not present

## 2019-01-05 DIAGNOSIS — T82898D Other specified complication of vascular prosthetic devices, implants and grafts, subsequent encounter: Secondary | ICD-10-CM | POA: Diagnosis not present

## 2019-01-05 DIAGNOSIS — Z48815 Encounter for surgical aftercare following surgery on the digestive system: Secondary | ICD-10-CM | POA: Diagnosis not present

## 2019-01-05 DIAGNOSIS — I5042 Chronic combined systolic (congestive) and diastolic (congestive) heart failure: Secondary | ICD-10-CM | POA: Diagnosis not present

## 2019-01-05 DIAGNOSIS — I132 Hypertensive heart and chronic kidney disease with heart failure and with stage 5 chronic kidney disease, or end stage renal disease: Secondary | ICD-10-CM | POA: Diagnosis not present

## 2019-01-06 ENCOUNTER — Other Ambulatory Visit: Payer: Self-pay | Admitting: Pharmacy Technician

## 2019-01-06 DIAGNOSIS — N2581 Secondary hyperparathyroidism of renal origin: Secondary | ICD-10-CM | POA: Diagnosis not present

## 2019-01-06 DIAGNOSIS — E119 Type 2 diabetes mellitus without complications: Secondary | ICD-10-CM | POA: Diagnosis not present

## 2019-01-06 DIAGNOSIS — D631 Anemia in chronic kidney disease: Secondary | ICD-10-CM | POA: Diagnosis not present

## 2019-01-06 DIAGNOSIS — Z23 Encounter for immunization: Secondary | ICD-10-CM | POA: Diagnosis not present

## 2019-01-06 DIAGNOSIS — N186 End stage renal disease: Secondary | ICD-10-CM | POA: Diagnosis not present

## 2019-01-06 NOTE — Patient Outreach (Signed)
[place] [facility]) Care Management  01/06/2019  [name] 1951/06/06 [id number]                           Medication Assistance Referral  Referral From: [place]: Chauncey Cruel / [employer] Patient application portion:  Mailed Provider application portion: Interoffice Mailed to Dr. Glori Bickers    Follow up:  Will follow up with patient in 5-10 business days to confirm application(s) have been received.  [name] P. [name], [place] Management [phone]

## 2019-01-07 DIAGNOSIS — B9689 Other specified bacterial agents as the cause of diseases classified elsewhere: Secondary | ICD-10-CM | POA: Diagnosis not present

## 2019-01-07 DIAGNOSIS — A0472 Enterocolitis due to Clostridium difficile, not specified as recurrent: Secondary | ICD-10-CM | POA: Diagnosis not present

## 2019-01-07 DIAGNOSIS — Z48815 Encounter for surgical aftercare following surgery on the digestive system: Secondary | ICD-10-CM | POA: Diagnosis not present

## 2019-01-07 DIAGNOSIS — T82898D Other specified complication of vascular prosthetic devices, implants and grafts, subsequent encounter: Secondary | ICD-10-CM | POA: Diagnosis not present

## 2019-01-07 DIAGNOSIS — I5042 Chronic combined systolic (congestive) and diastolic (congestive) heart failure: Secondary | ICD-10-CM | POA: Diagnosis not present

## 2019-01-07 DIAGNOSIS — I132 Hypertensive heart and chronic kidney disease with heart failure and with stage 5 chronic kidney disease, or end stage renal disease: Secondary | ICD-10-CM | POA: Diagnosis not present

## 2019-01-08 DIAGNOSIS — D631 Anemia in chronic kidney disease: Secondary | ICD-10-CM | POA: Diagnosis not present

## 2019-01-08 DIAGNOSIS — N186 End stage renal disease: Secondary | ICD-10-CM | POA: Diagnosis not present

## 2019-01-08 DIAGNOSIS — N2581 Secondary hyperparathyroidism of renal origin: Secondary | ICD-10-CM | POA: Diagnosis not present

## 2019-01-08 DIAGNOSIS — Z23 Encounter for immunization: Secondary | ICD-10-CM | POA: Diagnosis not present

## 2019-01-08 DIAGNOSIS — E119 Type 2 diabetes mellitus without complications: Secondary | ICD-10-CM | POA: Diagnosis not present

## 2019-01-09 DIAGNOSIS — E1351 Other specified diabetes mellitus with diabetic peripheral angiopathy without gangrene: Secondary | ICD-10-CM | POA: Diagnosis not present

## 2019-01-09 DIAGNOSIS — B351 Tinea unguium: Secondary | ICD-10-CM | POA: Diagnosis not present

## 2019-01-09 DIAGNOSIS — M79672 Pain in left foot: Secondary | ICD-10-CM | POA: Diagnosis not present

## 2019-01-09 DIAGNOSIS — M79671 Pain in right foot: Secondary | ICD-10-CM | POA: Diagnosis not present

## 2019-01-10 DIAGNOSIS — D631 Anemia in chronic kidney disease: Secondary | ICD-10-CM | POA: Diagnosis not present

## 2019-01-10 DIAGNOSIS — Z23 Encounter for immunization: Secondary | ICD-10-CM | POA: Diagnosis not present

## 2019-01-10 DIAGNOSIS — N186 End stage renal disease: Secondary | ICD-10-CM | POA: Diagnosis not present

## 2019-01-10 DIAGNOSIS — N2581 Secondary hyperparathyroidism of renal origin: Secondary | ICD-10-CM | POA: Diagnosis not present

## 2019-01-10 DIAGNOSIS — E119 Type 2 diabetes mellitus without complications: Secondary | ICD-10-CM | POA: Diagnosis not present

## 2019-01-12 ENCOUNTER — Other Ambulatory Visit: Payer: Self-pay | Admitting: Pharmacy Technician

## 2019-01-12 DIAGNOSIS — B9689 Other specified bacterial agents as the cause of diseases classified elsewhere: Secondary | ICD-10-CM | POA: Diagnosis not present

## 2019-01-12 DIAGNOSIS — Z48815 Encounter for surgical aftercare following surgery on the digestive system: Secondary | ICD-10-CM | POA: Diagnosis not present

## 2019-01-12 DIAGNOSIS — T82898D Other specified complication of vascular prosthetic devices, implants and grafts, subsequent encounter: Secondary | ICD-10-CM | POA: Diagnosis not present

## 2019-01-12 DIAGNOSIS — A0472 Enterocolitis due to Clostridium difficile, not specified as recurrent: Secondary | ICD-10-CM | POA: Diagnosis not present

## 2019-01-12 DIAGNOSIS — I5042 Chronic combined systolic (congestive) and diastolic (congestive) heart failure: Secondary | ICD-10-CM | POA: Diagnosis not present

## 2019-01-12 DIAGNOSIS — I132 Hypertensive heart and chronic kidney disease with heart failure and with stage 5 chronic kidney disease, or end stage renal disease: Secondary | ICD-10-CM | POA: Diagnosis not present

## 2019-01-12 NOTE — Patient Outreach (Signed)
[place] [facility]) Care Management  01/12/2019  TIMATHY NEWBERRY Aug 01, 1951 [id number]   Successful outreach call placed to patient in regards to [place] application for Cecilia.  Spoke to patient's wife, HIPAA identifiers verified.  Patient's wife informed that she had received the application in the mail but had not mailed it back yet. She informed she would work on that this week.  Will followup with patient in 10-14 business days if application has not been received back.  Dorann Davidson P. Karelyn Brisby, [place] Management [phone]

## 2019-01-13 ENCOUNTER — Telehealth: Payer: Self-pay

## 2019-01-13 DIAGNOSIS — N186 End stage renal disease: Secondary | ICD-10-CM | POA: Diagnosis not present

## 2019-01-13 DIAGNOSIS — E119 Type 2 diabetes mellitus without complications: Secondary | ICD-10-CM | POA: Diagnosis not present

## 2019-01-13 DIAGNOSIS — D631 Anemia in chronic kidney disease: Secondary | ICD-10-CM | POA: Diagnosis not present

## 2019-01-13 DIAGNOSIS — Z23 Encounter for immunization: Secondary | ICD-10-CM | POA: Diagnosis not present

## 2019-01-13 DIAGNOSIS — N2581 Secondary hyperparathyroidism of renal origin: Secondary | ICD-10-CM | POA: Diagnosis not present

## 2019-01-13 NOTE — Telephone Encounter (Signed)
Called to let the pt know that they were approved via pass and they already knew and the pts wife was unpacking the delivery of the praluent as we spoke

## 2019-01-14 DIAGNOSIS — B9689 Other specified bacterial agents as the cause of diseases classified elsewhere: Secondary | ICD-10-CM | POA: Diagnosis not present

## 2019-01-14 DIAGNOSIS — M48062 Spinal stenosis, lumbar region with neurogenic claudication: Secondary | ICD-10-CM | POA: Diagnosis not present

## 2019-01-14 DIAGNOSIS — M1991 Primary osteoarthritis, unspecified site: Secondary | ICD-10-CM | POA: Diagnosis not present

## 2019-01-14 DIAGNOSIS — M109 Gout, unspecified: Secondary | ICD-10-CM | POA: Diagnosis not present

## 2019-01-14 DIAGNOSIS — F329 Major depressive disorder, single episode, unspecified: Secondary | ICD-10-CM | POA: Diagnosis not present

## 2019-01-14 DIAGNOSIS — I5042 Chronic combined systolic (congestive) and diastolic (congestive) heart failure: Secondary | ICD-10-CM | POA: Diagnosis not present

## 2019-01-14 DIAGNOSIS — B179 Acute viral hepatitis, unspecified: Secondary | ICD-10-CM | POA: Diagnosis not present

## 2019-01-14 DIAGNOSIS — R1310 Dysphagia, unspecified: Secondary | ICD-10-CM | POA: Diagnosis not present

## 2019-01-14 DIAGNOSIS — T82898D Other specified complication of vascular prosthetic devices, implants and grafts, subsequent encounter: Secondary | ICD-10-CM | POA: Diagnosis not present

## 2019-01-14 DIAGNOSIS — I2510 Atherosclerotic heart disease of native coronary artery without angina pectoris: Secondary | ICD-10-CM | POA: Diagnosis not present

## 2019-01-14 DIAGNOSIS — I255 Ischemic cardiomyopathy: Secondary | ICD-10-CM | POA: Diagnosis not present

## 2019-01-14 DIAGNOSIS — I132 Hypertensive heart and chronic kidney disease with heart failure and with stage 5 chronic kidney disease, or end stage renal disease: Secondary | ICD-10-CM | POA: Diagnosis not present

## 2019-01-14 DIAGNOSIS — M5412 Radiculopathy, cervical region: Secondary | ICD-10-CM | POA: Diagnosis not present

## 2019-01-14 DIAGNOSIS — Z48815 Encounter for surgical aftercare following surgery on the digestive system: Secondary | ICD-10-CM | POA: Diagnosis not present

## 2019-01-14 DIAGNOSIS — N400 Enlarged prostate without lower urinary tract symptoms: Secondary | ICD-10-CM | POA: Diagnosis not present

## 2019-01-14 DIAGNOSIS — F0280 Dementia in other diseases classified elsewhere without behavioral disturbance: Secondary | ICD-10-CM | POA: Diagnosis not present

## 2019-01-14 DIAGNOSIS — E039 Hypothyroidism, unspecified: Secondary | ICD-10-CM | POA: Diagnosis not present

## 2019-01-14 DIAGNOSIS — I5082 Biventricular heart failure: Secondary | ICD-10-CM | POA: Diagnosis not present

## 2019-01-14 DIAGNOSIS — E1122 Type 2 diabetes mellitus with diabetic chronic kidney disease: Secondary | ICD-10-CM | POA: Diagnosis not present

## 2019-01-14 DIAGNOSIS — A0472 Enterocolitis due to Clostridium difficile, not specified as recurrent: Secondary | ICD-10-CM | POA: Diagnosis not present

## 2019-01-14 DIAGNOSIS — J449 Chronic obstructive pulmonary disease, unspecified: Secondary | ICD-10-CM | POA: Diagnosis not present

## 2019-01-14 DIAGNOSIS — E1142 Type 2 diabetes mellitus with diabetic polyneuropathy: Secondary | ICD-10-CM | POA: Diagnosis not present

## 2019-01-14 DIAGNOSIS — N186 End stage renal disease: Secondary | ICD-10-CM | POA: Diagnosis not present

## 2019-01-14 DIAGNOSIS — E1151 Type 2 diabetes mellitus with diabetic peripheral angiopathy without gangrene: Secondary | ICD-10-CM | POA: Diagnosis not present

## 2019-01-14 DIAGNOSIS — D631 Anemia in chronic kidney disease: Secondary | ICD-10-CM | POA: Diagnosis not present

## 2019-01-15 DIAGNOSIS — E119 Type 2 diabetes mellitus without complications: Secondary | ICD-10-CM | POA: Diagnosis not present

## 2019-01-15 DIAGNOSIS — D631 Anemia in chronic kidney disease: Secondary | ICD-10-CM | POA: Diagnosis not present

## 2019-01-15 DIAGNOSIS — N186 End stage renal disease: Secondary | ICD-10-CM | POA: Diagnosis not present

## 2019-01-15 DIAGNOSIS — Z23 Encounter for immunization: Secondary | ICD-10-CM | POA: Diagnosis not present

## 2019-01-15 DIAGNOSIS — N2581 Secondary hyperparathyroidism of renal origin: Secondary | ICD-10-CM | POA: Diagnosis not present

## 2019-01-16 ENCOUNTER — Other Ambulatory Visit: Payer: Self-pay | Admitting: *Deleted

## 2019-01-16 NOTE — Patient Outreach (Signed)
[place] [facility]) Care Management  01/16/2019  [name] October 08, 1950 [id number]   Call placed to member's caregiver/wife to follow up on member's current health condition and decision to allow more support in the home for long term care and assistance.  She report he is doing a little better, but is still refusing to allow any outside help.  Although she feel they both would benefit from assistance (as she is starting to have more problems with her back/legs) she declines resources.  She has been managing member's care "as best as I can" over the last several months.  Member has been consistent with dialysis treatments and MD visits.  No recent signs of infection, diabetes and blood pressure controlled (A1C - 6.9 in Feb. 2020).    Wife aware that assistance is limited if member continues to refuse recommendations and resources.  She verbalizes understanding.  She denies any further needs of complex case management.  This care manager inquired about ongoing disease management, wife agrees.  Will place referral to health coach and will notify primary MD of transition.  [name], [place], MSN [place] [phone]

## 2019-01-17 DIAGNOSIS — D631 Anemia in chronic kidney disease: Secondary | ICD-10-CM | POA: Diagnosis not present

## 2019-01-17 DIAGNOSIS — Z23 Encounter for immunization: Secondary | ICD-10-CM | POA: Diagnosis not present

## 2019-01-17 DIAGNOSIS — E119 Type 2 diabetes mellitus without complications: Secondary | ICD-10-CM | POA: Diagnosis not present

## 2019-01-17 DIAGNOSIS — N2581 Secondary hyperparathyroidism of renal origin: Secondary | ICD-10-CM | POA: Diagnosis not present

## 2019-01-17 DIAGNOSIS — N186 End stage renal disease: Secondary | ICD-10-CM | POA: Diagnosis not present

## 2019-01-19 DIAGNOSIS — Z48815 Encounter for surgical aftercare following surgery on the digestive system: Secondary | ICD-10-CM | POA: Diagnosis not present

## 2019-01-19 DIAGNOSIS — I5042 Chronic combined systolic (congestive) and diastolic (congestive) heart failure: Secondary | ICD-10-CM | POA: Diagnosis not present

## 2019-01-19 DIAGNOSIS — A0472 Enterocolitis due to Clostridium difficile, not specified as recurrent: Secondary | ICD-10-CM | POA: Diagnosis not present

## 2019-01-19 DIAGNOSIS — I132 Hypertensive heart and chronic kidney disease with heart failure and with stage 5 chronic kidney disease, or end stage renal disease: Secondary | ICD-10-CM | POA: Diagnosis not present

## 2019-01-19 DIAGNOSIS — B9689 Other specified bacterial agents as the cause of diseases classified elsewhere: Secondary | ICD-10-CM | POA: Diagnosis not present

## 2019-01-19 DIAGNOSIS — T82898D Other specified complication of vascular prosthetic devices, implants and grafts, subsequent encounter: Secondary | ICD-10-CM | POA: Diagnosis not present

## 2019-01-20 DIAGNOSIS — N2581 Secondary hyperparathyroidism of renal origin: Secondary | ICD-10-CM | POA: Diagnosis not present

## 2019-01-20 DIAGNOSIS — E119 Type 2 diabetes mellitus without complications: Secondary | ICD-10-CM | POA: Diagnosis not present

## 2019-01-20 DIAGNOSIS — N186 End stage renal disease: Secondary | ICD-10-CM | POA: Diagnosis not present

## 2019-01-20 DIAGNOSIS — D631 Anemia in chronic kidney disease: Secondary | ICD-10-CM | POA: Diagnosis not present

## 2019-01-20 DIAGNOSIS — Z23 Encounter for immunization: Secondary | ICD-10-CM | POA: Diagnosis not present

## 2019-01-21 DIAGNOSIS — I132 Hypertensive heart and chronic kidney disease with heart failure and with stage 5 chronic kidney disease, or end stage renal disease: Secondary | ICD-10-CM | POA: Diagnosis not present

## 2019-01-21 DIAGNOSIS — I5042 Chronic combined systolic (congestive) and diastolic (congestive) heart failure: Secondary | ICD-10-CM | POA: Diagnosis not present

## 2019-01-21 DIAGNOSIS — A0472 Enterocolitis due to Clostridium difficile, not specified as recurrent: Secondary | ICD-10-CM | POA: Diagnosis not present

## 2019-01-21 DIAGNOSIS — Z48815 Encounter for surgical aftercare following surgery on the digestive system: Secondary | ICD-10-CM | POA: Diagnosis not present

## 2019-01-21 DIAGNOSIS — T82898D Other specified complication of vascular prosthetic devices, implants and grafts, subsequent encounter: Secondary | ICD-10-CM | POA: Diagnosis not present

## 2019-01-21 DIAGNOSIS — B9689 Other specified bacterial agents as the cause of diseases classified elsewhere: Secondary | ICD-10-CM | POA: Diagnosis not present

## 2019-01-22 ENCOUNTER — Other Ambulatory Visit: Payer: Self-pay | Admitting: *Deleted

## 2019-01-22 DIAGNOSIS — N2581 Secondary hyperparathyroidism of renal origin: Secondary | ICD-10-CM | POA: Diagnosis not present

## 2019-01-22 DIAGNOSIS — N186 End stage renal disease: Secondary | ICD-10-CM | POA: Diagnosis not present

## 2019-01-22 DIAGNOSIS — Z23 Encounter for immunization: Secondary | ICD-10-CM | POA: Diagnosis not present

## 2019-01-22 DIAGNOSIS — D631 Anemia in chronic kidney disease: Secondary | ICD-10-CM | POA: Diagnosis not present

## 2019-01-22 DIAGNOSIS — E119 Type 2 diabetes mellitus without complications: Secondary | ICD-10-CM | POA: Diagnosis not present

## 2019-01-23 DIAGNOSIS — C61 Malignant neoplasm of prostate: Secondary | ICD-10-CM | POA: Diagnosis not present

## 2019-01-24 DIAGNOSIS — E119 Type 2 diabetes mellitus without complications: Secondary | ICD-10-CM | POA: Diagnosis not present

## 2019-01-24 DIAGNOSIS — D631 Anemia in chronic kidney disease: Secondary | ICD-10-CM | POA: Diagnosis not present

## 2019-01-24 DIAGNOSIS — N186 End stage renal disease: Secondary | ICD-10-CM | POA: Diagnosis not present

## 2019-01-24 DIAGNOSIS — N2581 Secondary hyperparathyroidism of renal origin: Secondary | ICD-10-CM | POA: Diagnosis not present

## 2019-01-24 DIAGNOSIS — Z23 Encounter for immunization: Secondary | ICD-10-CM | POA: Diagnosis not present

## 2019-01-26 DIAGNOSIS — B9689 Other specified bacterial agents as the cause of diseases classified elsewhere: Secondary | ICD-10-CM | POA: Diagnosis not present

## 2019-01-26 DIAGNOSIS — Z48815 Encounter for surgical aftercare following surgery on the digestive system: Secondary | ICD-10-CM | POA: Diagnosis not present

## 2019-01-26 DIAGNOSIS — I132 Hypertensive heart and chronic kidney disease with heart failure and with stage 5 chronic kidney disease, or end stage renal disease: Secondary | ICD-10-CM | POA: Diagnosis not present

## 2019-01-26 DIAGNOSIS — A0472 Enterocolitis due to Clostridium difficile, not specified as recurrent: Secondary | ICD-10-CM | POA: Diagnosis not present

## 2019-01-26 DIAGNOSIS — I5042 Chronic combined systolic (congestive) and diastolic (congestive) heart failure: Secondary | ICD-10-CM | POA: Diagnosis not present

## 2019-01-26 DIAGNOSIS — T82898D Other specified complication of vascular prosthetic devices, implants and grafts, subsequent encounter: Secondary | ICD-10-CM | POA: Diagnosis not present

## 2019-01-27 DIAGNOSIS — N2581 Secondary hyperparathyroidism of renal origin: Secondary | ICD-10-CM | POA: Diagnosis not present

## 2019-01-27 DIAGNOSIS — E119 Type 2 diabetes mellitus without complications: Secondary | ICD-10-CM | POA: Diagnosis not present

## 2019-01-27 DIAGNOSIS — D631 Anemia in chronic kidney disease: Secondary | ICD-10-CM | POA: Diagnosis not present

## 2019-01-27 DIAGNOSIS — Z23 Encounter for immunization: Secondary | ICD-10-CM | POA: Diagnosis not present

## 2019-01-27 DIAGNOSIS — N186 End stage renal disease: Secondary | ICD-10-CM | POA: Diagnosis not present

## 2019-01-28 DIAGNOSIS — B9689 Other specified bacterial agents as the cause of diseases classified elsewhere: Secondary | ICD-10-CM | POA: Diagnosis not present

## 2019-01-28 DIAGNOSIS — A0472 Enterocolitis due to Clostridium difficile, not specified as recurrent: Secondary | ICD-10-CM | POA: Diagnosis not present

## 2019-01-28 DIAGNOSIS — I5042 Chronic combined systolic (congestive) and diastolic (congestive) heart failure: Secondary | ICD-10-CM | POA: Diagnosis not present

## 2019-01-28 DIAGNOSIS — T82898D Other specified complication of vascular prosthetic devices, implants and grafts, subsequent encounter: Secondary | ICD-10-CM | POA: Diagnosis not present

## 2019-01-28 DIAGNOSIS — Z48815 Encounter for surgical aftercare following surgery on the digestive system: Secondary | ICD-10-CM | POA: Diagnosis not present

## 2019-01-28 DIAGNOSIS — I132 Hypertensive heart and chronic kidney disease with heart failure and with stage 5 chronic kidney disease, or end stage renal disease: Secondary | ICD-10-CM | POA: Diagnosis not present

## 2019-01-29 ENCOUNTER — Other Ambulatory Visit: Payer: Self-pay | Admitting: Internal Medicine

## 2019-01-29 DIAGNOSIS — D631 Anemia in chronic kidney disease: Secondary | ICD-10-CM | POA: Diagnosis not present

## 2019-01-29 DIAGNOSIS — E119 Type 2 diabetes mellitus without complications: Secondary | ICD-10-CM | POA: Diagnosis not present

## 2019-01-29 DIAGNOSIS — N2581 Secondary hyperparathyroidism of renal origin: Secondary | ICD-10-CM | POA: Diagnosis not present

## 2019-01-29 DIAGNOSIS — N186 End stage renal disease: Secondary | ICD-10-CM | POA: Diagnosis not present

## 2019-01-29 DIAGNOSIS — Z23 Encounter for immunization: Secondary | ICD-10-CM | POA: Diagnosis not present

## 2019-01-30 ENCOUNTER — Other Ambulatory Visit: Payer: Self-pay | Admitting: Pharmacy Technician

## 2019-01-30 DIAGNOSIS — Z992 Dependence on renal dialysis: Secondary | ICD-10-CM | POA: Diagnosis not present

## 2019-01-30 DIAGNOSIS — N186 End stage renal disease: Secondary | ICD-10-CM | POA: Diagnosis not present

## 2019-01-30 DIAGNOSIS — C61 Malignant neoplasm of prostate: Secondary | ICD-10-CM | POA: Diagnosis not present

## 2019-01-30 DIAGNOSIS — E1129 Type 2 diabetes mellitus with other diabetic kidney complication: Secondary | ICD-10-CM | POA: Diagnosis not present

## 2019-01-30 NOTE — Patient Outreach (Signed)
[place] [facility]) Care Management  01/30/2019  [name] Jul 29, 1951 [id number]  Successful outgoing call placed to patient's wife [name] in regards to [employer] patient assistance for [employer] identifiers verified. Patient's wife informed she had not mailed back his application yet as she was waiting on her applications to come in. She received her applications yesterday. She informed she would place them in the mail today.  Will followup with patient in 10-14 business days if applications have not been received back.  Renee Erb P. [name], [place] Management [phone]

## 2019-01-31 DIAGNOSIS — N2581 Secondary hyperparathyroidism of renal origin: Secondary | ICD-10-CM | POA: Diagnosis not present

## 2019-01-31 DIAGNOSIS — E119 Type 2 diabetes mellitus without complications: Secondary | ICD-10-CM | POA: Diagnosis not present

## 2019-01-31 DIAGNOSIS — N186 End stage renal disease: Secondary | ICD-10-CM | POA: Diagnosis not present

## 2019-01-31 DIAGNOSIS — Z23 Encounter for immunization: Secondary | ICD-10-CM | POA: Diagnosis not present

## 2019-01-31 DIAGNOSIS — D631 Anemia in chronic kidney disease: Secondary | ICD-10-CM | POA: Diagnosis not present

## 2019-02-02 ENCOUNTER — Other Ambulatory Visit: Payer: Self-pay | Admitting: *Deleted

## 2019-02-02 DIAGNOSIS — T82898D Other specified complication of vascular prosthetic devices, implants and grafts, subsequent encounter: Secondary | ICD-10-CM | POA: Diagnosis not present

## 2019-02-02 DIAGNOSIS — A0472 Enterocolitis due to Clostridium difficile, not specified as recurrent: Secondary | ICD-10-CM | POA: Diagnosis not present

## 2019-02-02 DIAGNOSIS — I5042 Chronic combined systolic (congestive) and diastolic (congestive) heart failure: Secondary | ICD-10-CM | POA: Diagnosis not present

## 2019-02-02 DIAGNOSIS — Z48815 Encounter for surgical aftercare following surgery on the digestive system: Secondary | ICD-10-CM | POA: Diagnosis not present

## 2019-02-02 DIAGNOSIS — I132 Hypertensive heart and chronic kidney disease with heart failure and with stage 5 chronic kidney disease, or end stage renal disease: Secondary | ICD-10-CM | POA: Diagnosis not present

## 2019-02-02 DIAGNOSIS — B9689 Other specified bacterial agents as the cause of diseases classified elsewhere: Secondary | ICD-10-CM | POA: Diagnosis not present

## 2019-02-02 NOTE — Patient Outreach (Signed)
[place] [facility]) Care Management  02/02/2019  CLEAVON GOLDMAN Oct 23, 1950 [id number]   [place] Initial Assessment  Referral Date:  01/16/2019 Referral Source:  Transfer from [place] Reason for Referral:  Continued Disease Management Insurance:  Medicare   Outreach Attempt:  Outreach attempt #1 to patient's wife for initial telephone assessment.  Wife answered and stated she was in the process of working with patient and physical therapist and requested a telephone call back at a later date.  Plan:  RN Health Coach will make another telephone outreach to patient within the month of May.    [place] Coach [phone] [email or web address]

## 2019-02-03 DIAGNOSIS — D631 Anemia in chronic kidney disease: Secondary | ICD-10-CM | POA: Diagnosis not present

## 2019-02-03 DIAGNOSIS — N2581 Secondary hyperparathyroidism of renal origin: Secondary | ICD-10-CM | POA: Diagnosis not present

## 2019-02-03 DIAGNOSIS — N186 End stage renal disease: Secondary | ICD-10-CM | POA: Diagnosis not present

## 2019-02-03 DIAGNOSIS — E119 Type 2 diabetes mellitus without complications: Secondary | ICD-10-CM | POA: Diagnosis not present

## 2019-02-03 DIAGNOSIS — Z23 Encounter for immunization: Secondary | ICD-10-CM | POA: Diagnosis not present

## 2019-02-04 DIAGNOSIS — I5042 Chronic combined systolic (congestive) and diastolic (congestive) heart failure: Secondary | ICD-10-CM | POA: Diagnosis not present

## 2019-02-04 DIAGNOSIS — B9689 Other specified bacterial agents as the cause of diseases classified elsewhere: Secondary | ICD-10-CM | POA: Diagnosis not present

## 2019-02-04 DIAGNOSIS — I132 Hypertensive heart and chronic kidney disease with heart failure and with stage 5 chronic kidney disease, or end stage renal disease: Secondary | ICD-10-CM | POA: Diagnosis not present

## 2019-02-04 DIAGNOSIS — A0472 Enterocolitis due to Clostridium difficile, not specified as recurrent: Secondary | ICD-10-CM | POA: Diagnosis not present

## 2019-02-04 DIAGNOSIS — Z48815 Encounter for surgical aftercare following surgery on the digestive system: Secondary | ICD-10-CM | POA: Diagnosis not present

## 2019-02-04 DIAGNOSIS — T82898D Other specified complication of vascular prosthetic devices, implants and grafts, subsequent encounter: Secondary | ICD-10-CM | POA: Diagnosis not present

## 2019-02-05 ENCOUNTER — Telehealth: Payer: Self-pay | Admitting: Internal Medicine

## 2019-02-05 DIAGNOSIS — E119 Type 2 diabetes mellitus without complications: Secondary | ICD-10-CM | POA: Diagnosis not present

## 2019-02-05 DIAGNOSIS — N186 End stage renal disease: Secondary | ICD-10-CM | POA: Diagnosis not present

## 2019-02-05 DIAGNOSIS — N2581 Secondary hyperparathyroidism of renal origin: Secondary | ICD-10-CM | POA: Diagnosis not present

## 2019-02-05 DIAGNOSIS — Z23 Encounter for immunization: Secondary | ICD-10-CM | POA: Diagnosis not present

## 2019-02-05 DIAGNOSIS — D631 Anemia in chronic kidney disease: Secondary | ICD-10-CM | POA: Diagnosis not present

## 2019-02-05 NOTE — Telephone Encounter (Signed)
Copied from [place] [phone]. Topic: Quick Communication - Home Health Verbal Orders >> Feb 04, 2019  4:05 PM Rainey Pines A wrote: Caller/Agency:Kate Dema Severin, [facility] Number [phone] Requesting OT/PT/Skilled Nursing/Social Work/Speech Therapy: PT Frequency: 2w1

## 2019-02-05 NOTE — Telephone Encounter (Signed)
Verbal orders given via VM 

## 2019-02-07 DIAGNOSIS — E119 Type 2 diabetes mellitus without complications: Secondary | ICD-10-CM | POA: Diagnosis not present

## 2019-02-07 DIAGNOSIS — D631 Anemia in chronic kidney disease: Secondary | ICD-10-CM | POA: Diagnosis not present

## 2019-02-07 DIAGNOSIS — N2581 Secondary hyperparathyroidism of renal origin: Secondary | ICD-10-CM | POA: Diagnosis not present

## 2019-02-07 DIAGNOSIS — N186 End stage renal disease: Secondary | ICD-10-CM | POA: Diagnosis not present

## 2019-02-07 DIAGNOSIS — Z23 Encounter for immunization: Secondary | ICD-10-CM | POA: Diagnosis not present

## 2019-02-09 DIAGNOSIS — I5042 Chronic combined systolic (congestive) and diastolic (congestive) heart failure: Secondary | ICD-10-CM | POA: Diagnosis not present

## 2019-02-09 DIAGNOSIS — B9689 Other specified bacterial agents as the cause of diseases classified elsewhere: Secondary | ICD-10-CM | POA: Diagnosis not present

## 2019-02-09 DIAGNOSIS — I132 Hypertensive heart and chronic kidney disease with heart failure and with stage 5 chronic kidney disease, or end stage renal disease: Secondary | ICD-10-CM | POA: Diagnosis not present

## 2019-02-09 DIAGNOSIS — A0472 Enterocolitis due to Clostridium difficile, not specified as recurrent: Secondary | ICD-10-CM | POA: Diagnosis not present

## 2019-02-09 DIAGNOSIS — Z48815 Encounter for surgical aftercare following surgery on the digestive system: Secondary | ICD-10-CM | POA: Diagnosis not present

## 2019-02-09 DIAGNOSIS — T82898D Other specified complication of vascular prosthetic devices, implants and grafts, subsequent encounter: Secondary | ICD-10-CM | POA: Diagnosis not present

## 2019-02-10 DIAGNOSIS — Z23 Encounter for immunization: Secondary | ICD-10-CM | POA: Diagnosis not present

## 2019-02-10 DIAGNOSIS — E119 Type 2 diabetes mellitus without complications: Secondary | ICD-10-CM | POA: Diagnosis not present

## 2019-02-10 DIAGNOSIS — N2581 Secondary hyperparathyroidism of renal origin: Secondary | ICD-10-CM | POA: Diagnosis not present

## 2019-02-10 DIAGNOSIS — D631 Anemia in chronic kidney disease: Secondary | ICD-10-CM | POA: Diagnosis not present

## 2019-02-10 DIAGNOSIS — N186 End stage renal disease: Secondary | ICD-10-CM | POA: Diagnosis not present

## 2019-02-11 ENCOUNTER — Telehealth: Payer: Self-pay | Admitting: Internal Medicine

## 2019-02-11 DIAGNOSIS — A0472 Enterocolitis due to Clostridium difficile, not specified as recurrent: Secondary | ICD-10-CM | POA: Diagnosis not present

## 2019-02-11 DIAGNOSIS — Z48815 Encounter for surgical aftercare following surgery on the digestive system: Secondary | ICD-10-CM | POA: Diagnosis not present

## 2019-02-11 DIAGNOSIS — T82898D Other specified complication of vascular prosthetic devices, implants and grafts, subsequent encounter: Secondary | ICD-10-CM | POA: Diagnosis not present

## 2019-02-11 DIAGNOSIS — I132 Hypertensive heart and chronic kidney disease with heart failure and with stage 5 chronic kidney disease, or end stage renal disease: Secondary | ICD-10-CM | POA: Diagnosis not present

## 2019-02-11 DIAGNOSIS — B9689 Other specified bacterial agents as the cause of diseases classified elsewhere: Secondary | ICD-10-CM | POA: Diagnosis not present

## 2019-02-11 DIAGNOSIS — I5042 Chronic combined systolic (congestive) and diastolic (congestive) heart failure: Secondary | ICD-10-CM | POA: Diagnosis not present

## 2019-02-11 NOTE — Telephone Encounter (Signed)
Copied from [place]. Topic: Quick Communication - Home Health Verbal Orders >> Feb 11, 2019  3:52 PM Mathis Bud wrote: Caller/Agency: Gennaro Africa from [facility] Number: [phone] Requesting PT Frequency: 2x 4x

## 2019-02-11 NOTE — Telephone Encounter (Signed)
Verbal orders given via VM 

## 2019-02-12 ENCOUNTER — Other Ambulatory Visit: Payer: Self-pay | Admitting: *Deleted

## 2019-02-12 ENCOUNTER — Other Ambulatory Visit: Payer: Self-pay | Admitting: Pharmacy Technician

## 2019-02-12 DIAGNOSIS — E039 Hypothyroidism, unspecified: Secondary | ICD-10-CM

## 2019-02-12 DIAGNOSIS — F0280 Dementia in other diseases classified elsewhere without behavioral disturbance: Secondary | ICD-10-CM

## 2019-02-12 DIAGNOSIS — N2581 Secondary hyperparathyroidism of renal origin: Secondary | ICD-10-CM | POA: Diagnosis not present

## 2019-02-12 DIAGNOSIS — E1122 Type 2 diabetes mellitus with diabetic chronic kidney disease: Secondary | ICD-10-CM | POA: Diagnosis not present

## 2019-02-12 DIAGNOSIS — I5082 Biventricular heart failure: Secondary | ICD-10-CM | POA: Diagnosis not present

## 2019-02-12 DIAGNOSIS — Z86010 Personal history of colonic polyps: Secondary | ICD-10-CM

## 2019-02-12 DIAGNOSIS — B179 Acute viral hepatitis, unspecified: Secondary | ICD-10-CM | POA: Diagnosis not present

## 2019-02-12 DIAGNOSIS — R1310 Dysphagia, unspecified: Secondary | ICD-10-CM

## 2019-02-12 DIAGNOSIS — Z48815 Encounter for surgical aftercare following surgery on the digestive system: Secondary | ICD-10-CM | POA: Diagnosis not present

## 2019-02-12 DIAGNOSIS — Z9181 History of falling: Secondary | ICD-10-CM

## 2019-02-12 DIAGNOSIS — Z96651 Presence of right artificial knee joint: Secondary | ICD-10-CM

## 2019-02-12 DIAGNOSIS — Z452 Encounter for adjustment and management of vascular access device: Secondary | ICD-10-CM

## 2019-02-12 DIAGNOSIS — E119 Type 2 diabetes mellitus without complications: Secondary | ICD-10-CM | POA: Diagnosis not present

## 2019-02-12 DIAGNOSIS — T82898D Other specified complication of vascular prosthetic devices, implants and grafts, subsequent encounter: Secondary | ICD-10-CM | POA: Diagnosis not present

## 2019-02-12 DIAGNOSIS — M1991 Primary osteoarthritis, unspecified site: Secondary | ICD-10-CM

## 2019-02-12 DIAGNOSIS — I5042 Chronic combined systolic (congestive) and diastolic (congestive) heart failure: Secondary | ICD-10-CM | POA: Diagnosis not present

## 2019-02-12 DIAGNOSIS — Z8546 Personal history of malignant neoplasm of prostate: Secondary | ICD-10-CM

## 2019-02-12 DIAGNOSIS — Z87891 Personal history of nicotine dependence: Secondary | ICD-10-CM

## 2019-02-12 DIAGNOSIS — I132 Hypertensive heart and chronic kidney disease with heart failure and with stage 5 chronic kidney disease, or end stage renal disease: Secondary | ICD-10-CM | POA: Diagnosis not present

## 2019-02-12 DIAGNOSIS — Z9861 Coronary angioplasty status: Secondary | ICD-10-CM

## 2019-02-12 DIAGNOSIS — J449 Chronic obstructive pulmonary disease, unspecified: Secondary | ICD-10-CM

## 2019-02-12 DIAGNOSIS — Z7982 Long term (current) use of aspirin: Secondary | ICD-10-CM

## 2019-02-12 DIAGNOSIS — Z8673 Personal history of transient ischemic attack (TIA), and cerebral infarction without residual deficits: Secondary | ICD-10-CM

## 2019-02-12 DIAGNOSIS — N400 Enlarged prostate without lower urinary tract symptoms: Secondary | ICD-10-CM

## 2019-02-12 DIAGNOSIS — B9689 Other specified bacterial agents as the cause of diseases classified elsewhere: Secondary | ICD-10-CM | POA: Diagnosis not present

## 2019-02-12 DIAGNOSIS — D631 Anemia in chronic kidney disease: Secondary | ICD-10-CM | POA: Diagnosis not present

## 2019-02-12 DIAGNOSIS — Z792 Long term (current) use of antibiotics: Secondary | ICD-10-CM

## 2019-02-12 DIAGNOSIS — E1142 Type 2 diabetes mellitus with diabetic polyneuropathy: Secondary | ICD-10-CM

## 2019-02-12 DIAGNOSIS — I255 Ischemic cardiomyopathy: Secondary | ICD-10-CM

## 2019-02-12 DIAGNOSIS — A0472 Enterocolitis due to Clostridium difficile, not specified as recurrent: Secondary | ICD-10-CM | POA: Diagnosis not present

## 2019-02-12 DIAGNOSIS — I2510 Atherosclerotic heart disease of native coronary artery without angina pectoris: Secondary | ICD-10-CM | POA: Diagnosis not present

## 2019-02-12 DIAGNOSIS — E1151 Type 2 diabetes mellitus with diabetic peripheral angiopathy without gangrene: Secondary | ICD-10-CM

## 2019-02-12 DIAGNOSIS — M48062 Spinal stenosis, lumbar region with neurogenic claudication: Secondary | ICD-10-CM

## 2019-02-12 DIAGNOSIS — M109 Gout, unspecified: Secondary | ICD-10-CM

## 2019-02-12 DIAGNOSIS — M5412 Radiculopathy, cervical region: Secondary | ICD-10-CM

## 2019-02-12 DIAGNOSIS — Z8701 Personal history of pneumonia (recurrent): Secondary | ICD-10-CM

## 2019-02-12 DIAGNOSIS — N186 End stage renal disease: Secondary | ICD-10-CM | POA: Diagnosis not present

## 2019-02-12 DIAGNOSIS — I252 Old myocardial infarction: Secondary | ICD-10-CM

## 2019-02-12 DIAGNOSIS — F329 Major depressive disorder, single episode, unspecified: Secondary | ICD-10-CM

## 2019-02-12 DIAGNOSIS — Z23 Encounter for immunization: Secondary | ICD-10-CM | POA: Diagnosis not present

## 2019-02-12 NOTE — Patient Outreach (Signed)
[place] [facility]) Care Management  02/12/2019  [name] Jun 25, 1951 [id number]    Received all necessary documents and signatures back from both patient and provider for [employer] patient assistance for Zetia.  Submitted completed application via mail to [employer].  Will followup with [employer] in 10-14 business days to inquire on status of application.  [name], [place] Management [phone]

## 2019-02-12 NOTE — Patient Outreach (Signed)
[place] [facility]) Care Management  02/12/2019  [name] 03-Dec-1950 [id number]   RN Health Coach Initial Assessment  Referral Date:  01/16/2019 Referral Source:  Transfer from [place] Reason for Referral:  Continued Disease Management Insurance:  Medicare   Outreach Attempt:  Outreach attempt #2 to patient for introduction and initial telephone assessment.  Wife answered and verified HIPAA.  Stated patient was not home and is at hemodialysis.  Requested telephone call back on a non dialysis day to speak with patient.   Plan:  RN Health Coach will make another outreach attempt on non dialysis day to complete initial telephone assessment.  [place] [phone] [name]

## 2019-02-13 DIAGNOSIS — E039 Hypothyroidism, unspecified: Secondary | ICD-10-CM | POA: Diagnosis not present

## 2019-02-13 DIAGNOSIS — M48062 Spinal stenosis, lumbar region with neurogenic claudication: Secondary | ICD-10-CM | POA: Diagnosis not present

## 2019-02-13 DIAGNOSIS — E1122 Type 2 diabetes mellitus with diabetic chronic kidney disease: Secondary | ICD-10-CM | POA: Diagnosis not present

## 2019-02-13 DIAGNOSIS — T82898D Other specified complication of vascular prosthetic devices, implants and grafts, subsequent encounter: Secondary | ICD-10-CM | POA: Diagnosis not present

## 2019-02-13 DIAGNOSIS — I2510 Atherosclerotic heart disease of native coronary artery without angina pectoris: Secondary | ICD-10-CM | POA: Diagnosis not present

## 2019-02-13 DIAGNOSIS — M5412 Radiculopathy, cervical region: Secondary | ICD-10-CM | POA: Diagnosis not present

## 2019-02-13 DIAGNOSIS — I132 Hypertensive heart and chronic kidney disease with heart failure and with stage 5 chronic kidney disease, or end stage renal disease: Secondary | ICD-10-CM | POA: Diagnosis not present

## 2019-02-13 DIAGNOSIS — B179 Acute viral hepatitis, unspecified: Secondary | ICD-10-CM | POA: Diagnosis not present

## 2019-02-13 DIAGNOSIS — E1151 Type 2 diabetes mellitus with diabetic peripheral angiopathy without gangrene: Secondary | ICD-10-CM | POA: Diagnosis not present

## 2019-02-13 DIAGNOSIS — J449 Chronic obstructive pulmonary disease, unspecified: Secondary | ICD-10-CM | POA: Diagnosis not present

## 2019-02-13 DIAGNOSIS — F0280 Dementia in other diseases classified elsewhere without behavioral disturbance: Secondary | ICD-10-CM | POA: Diagnosis not present

## 2019-02-13 DIAGNOSIS — I255 Ischemic cardiomyopathy: Secondary | ICD-10-CM | POA: Diagnosis not present

## 2019-02-13 DIAGNOSIS — M1991 Primary osteoarthritis, unspecified site: Secondary | ICD-10-CM | POA: Diagnosis not present

## 2019-02-13 DIAGNOSIS — A0472 Enterocolitis due to Clostridium difficile, not specified as recurrent: Secondary | ICD-10-CM | POA: Diagnosis not present

## 2019-02-13 DIAGNOSIS — N400 Enlarged prostate without lower urinary tract symptoms: Secondary | ICD-10-CM | POA: Diagnosis not present

## 2019-02-13 DIAGNOSIS — I5042 Chronic combined systolic (congestive) and diastolic (congestive) heart failure: Secondary | ICD-10-CM | POA: Diagnosis not present

## 2019-02-13 DIAGNOSIS — D631 Anemia in chronic kidney disease: Secondary | ICD-10-CM | POA: Diagnosis not present

## 2019-02-13 DIAGNOSIS — R1310 Dysphagia, unspecified: Secondary | ICD-10-CM | POA: Diagnosis not present

## 2019-02-13 DIAGNOSIS — F329 Major depressive disorder, single episode, unspecified: Secondary | ICD-10-CM | POA: Diagnosis not present

## 2019-02-13 DIAGNOSIS — B9689 Other specified bacterial agents as the cause of diseases classified elsewhere: Secondary | ICD-10-CM | POA: Diagnosis not present

## 2019-02-13 DIAGNOSIS — N186 End stage renal disease: Secondary | ICD-10-CM | POA: Diagnosis not present

## 2019-02-13 DIAGNOSIS — Z48815 Encounter for surgical aftercare following surgery on the digestive system: Secondary | ICD-10-CM | POA: Diagnosis not present

## 2019-02-13 DIAGNOSIS — E1142 Type 2 diabetes mellitus with diabetic polyneuropathy: Secondary | ICD-10-CM | POA: Diagnosis not present

## 2019-02-13 DIAGNOSIS — I5082 Biventricular heart failure: Secondary | ICD-10-CM | POA: Diagnosis not present

## 2019-02-13 DIAGNOSIS — M109 Gout, unspecified: Secondary | ICD-10-CM | POA: Diagnosis not present

## 2019-02-14 DIAGNOSIS — N186 End stage renal disease: Secondary | ICD-10-CM | POA: Diagnosis not present

## 2019-02-14 DIAGNOSIS — N2581 Secondary hyperparathyroidism of renal origin: Secondary | ICD-10-CM | POA: Diagnosis not present

## 2019-02-14 DIAGNOSIS — D631 Anemia in chronic kidney disease: Secondary | ICD-10-CM | POA: Diagnosis not present

## 2019-02-14 DIAGNOSIS — E119 Type 2 diabetes mellitus without complications: Secondary | ICD-10-CM | POA: Diagnosis not present

## 2019-02-14 DIAGNOSIS — Z23 Encounter for immunization: Secondary | ICD-10-CM | POA: Diagnosis not present

## 2019-02-16 ENCOUNTER — Other Ambulatory Visit: Payer: Self-pay

## 2019-02-16 DIAGNOSIS — T82898D Other specified complication of vascular prosthetic devices, implants and grafts, subsequent encounter: Secondary | ICD-10-CM | POA: Diagnosis not present

## 2019-02-16 DIAGNOSIS — I871 Compression of vein: Secondary | ICD-10-CM | POA: Diagnosis not present

## 2019-02-16 DIAGNOSIS — N186 End stage renal disease: Secondary | ICD-10-CM | POA: Diagnosis not present

## 2019-02-16 DIAGNOSIS — I5042 Chronic combined systolic (congestive) and diastolic (congestive) heart failure: Secondary | ICD-10-CM | POA: Diagnosis not present

## 2019-02-16 DIAGNOSIS — Z992 Dependence on renal dialysis: Secondary | ICD-10-CM | POA: Diagnosis not present

## 2019-02-16 DIAGNOSIS — Z48815 Encounter for surgical aftercare following surgery on the digestive system: Secondary | ICD-10-CM | POA: Diagnosis not present

## 2019-02-16 DIAGNOSIS — I132 Hypertensive heart and chronic kidney disease with heart failure and with stage 5 chronic kidney disease, or end stage renal disease: Secondary | ICD-10-CM | POA: Diagnosis not present

## 2019-02-16 DIAGNOSIS — B9689 Other specified bacterial agents as the cause of diseases classified elsewhere: Secondary | ICD-10-CM | POA: Diagnosis not present

## 2019-02-16 DIAGNOSIS — A0472 Enterocolitis due to Clostridium difficile, not specified as recurrent: Secondary | ICD-10-CM | POA: Diagnosis not present

## 2019-02-16 DIAGNOSIS — T82898A Other specified complication of vascular prosthetic devices, implants and grafts, initial encounter: Secondary | ICD-10-CM | POA: Diagnosis not present

## 2019-02-17 ENCOUNTER — Encounter: Payer: Self-pay | Admitting: Endocrinology

## 2019-02-17 DIAGNOSIS — N2581 Secondary hyperparathyroidism of renal origin: Secondary | ICD-10-CM | POA: Diagnosis not present

## 2019-02-17 DIAGNOSIS — Z23 Encounter for immunization: Secondary | ICD-10-CM | POA: Diagnosis not present

## 2019-02-17 DIAGNOSIS — N186 End stage renal disease: Secondary | ICD-10-CM | POA: Diagnosis not present

## 2019-02-17 DIAGNOSIS — D631 Anemia in chronic kidney disease: Secondary | ICD-10-CM | POA: Diagnosis not present

## 2019-02-17 DIAGNOSIS — E119 Type 2 diabetes mellitus without complications: Secondary | ICD-10-CM | POA: Diagnosis not present

## 2019-02-18 ENCOUNTER — Ambulatory Visit (INDEPENDENT_AMBULATORY_CARE_PROVIDER_SITE_OTHER): Payer: Medicare Other | Admitting: Endocrinology

## 2019-02-18 ENCOUNTER — Ambulatory Visit: Payer: Medicare Other | Admitting: Endocrinology

## 2019-02-18 ENCOUNTER — Other Ambulatory Visit: Payer: Self-pay

## 2019-02-18 DIAGNOSIS — E1122 Type 2 diabetes mellitus with diabetic chronic kidney disease: Secondary | ICD-10-CM | POA: Diagnosis not present

## 2019-02-18 DIAGNOSIS — I132 Hypertensive heart and chronic kidney disease with heart failure and with stage 5 chronic kidney disease, or end stage renal disease: Secondary | ICD-10-CM | POA: Diagnosis not present

## 2019-02-18 DIAGNOSIS — A0472 Enterocolitis due to Clostridium difficile, not specified as recurrent: Secondary | ICD-10-CM | POA: Diagnosis not present

## 2019-02-18 DIAGNOSIS — I5042 Chronic combined systolic (congestive) and diastolic (congestive) heart failure: Secondary | ICD-10-CM | POA: Diagnosis not present

## 2019-02-18 DIAGNOSIS — E1121 Type 2 diabetes mellitus with diabetic nephropathy: Secondary | ICD-10-CM

## 2019-02-18 DIAGNOSIS — T82898D Other specified complication of vascular prosthetic devices, implants and grafts, subsequent encounter: Secondary | ICD-10-CM | POA: Diagnosis not present

## 2019-02-18 DIAGNOSIS — N184 Chronic kidney disease, stage 4 (severe): Secondary | ICD-10-CM | POA: Diagnosis not present

## 2019-02-18 DIAGNOSIS — B9689 Other specified bacterial agents as the cause of diseases classified elsewhere: Secondary | ICD-10-CM | POA: Diagnosis not present

## 2019-02-18 DIAGNOSIS — Z48815 Encounter for surgical aftercare following surgery on the digestive system: Secondary | ICD-10-CM | POA: Diagnosis not present

## 2019-02-18 MED ORDER — COLESTIPOL HCL 1 G PO TABS: 3.0000 g | ORAL_TABLET | Freq: Every day | ORAL | 11 refills | Status: AC

## 2019-02-18 NOTE — Progress Notes (Signed)
Subjective:    Patient ID: [name], male    DOB: 03-15-1951, 68 y.o.   MRN: [id number]  HPI telehealth visit today via phone x 6 minutes Alternatives to telehealth are presented to this patient, and the patient agrees to the telehealth visit. Pt is advised of the cost of the visit, and agrees to this, also.   Patient is at home, and I am at the office.   Persons attending the telehealth visit: the patient and I Pt returns for f/u of diabetes mellitus:  DM type: 2 Dx'ed: 3220 Complications: polyneuropathy, renal failure, CVA, and CAD.  Therapy: welchol DKA: never.  Severe hypoglycemia: never.  Pancreatitis: never.  Other: renal failure limits rx options; DM med needs have decreased with worsening of renal function.   Interval history: pt states cbg's are well-controlled.  There is no trend throughout the day.  He denies hypoglycemia.   Pt also has hyperthyroidism (due to a multinodular goiter; dx'ed 2009; bx then was benign; nuc med scan showed very low uptake, so tapazole was chosen as rx).  He takes tapazole as rx'ed.  Past Medical History:  Diagnosis Date  . Acute encephalopathy 10/04/2018  . Allergic rhinitis, cause unspecified 02/24/2014  . Anemia 06/16/2011  . BENIGN PROSTATIC HYPERTROPHY 10/14/2009  . CAD, NATIVE VESSEL 02/06/2009   a. 06/2007 s/p Taxus DES to the RCA;  b. 08/2016 NSTEMI in setting of SVT/PCI: LM 30ost, LAD 33m (3.0x16 Synergy DES), LCX 27m, OM1 60, RI 40, RCA 70p/m, 39m - not amenable to PCI.  [name] Cervical radiculopathy, chronic 02/23/2016   Right c5-6 by NCS/EMG  . Chest pain 08/09/2015  . Chronic combined systolic (congestive) and diastolic (congestive) heart failure ([place])    a. 10/2016 Echo: EF 40-45%, Gr2 DD. mildly dil LA.  [name] COLONIC POLYPS, HX OF 10/14/2009  . Dementia ([place]) [id number]  . Depression 09/24/2015  . DIABETES MELLITUS, TYPE II 02/01/2010  . DYSLIPIDEMIA 06/18/2007  . ESRD (end stage renal disease) on dialysis ([place]) 08/04/2010   ESRD due to DM  adn HTN, started HD in 2011. As of 2019 has a R arm graft and gets HD on TTS sched  . FOOT PAIN 08/12/2008  . GAIT DISTURBANCE 03/03/2010  . GASTROENTERITIS, VIRAL 10/14/2009  . GERD 06/18/2007  . GOITER, MULTINODULAR 12/26/2007  . GOUT 06/18/2007  . GYNECOMASTIA 07/17/2010  . Hemodialysis access, fistula mature [facility])    Dialysis T-Th-Sa ([place]) Right upper arm fistula  . Hyperlipidemia 10/16/2011  . Hyperparathyroidism, secondary ([place]) 06/16/2011  . HYPERTENSION 06/18/2007  . Hyperthyroidism   . Hypocalcemia 06/07/2010  . Ischemic cardiomyopathy    a. 10/2016 Echo: EF 40-45%.  . Lumbar stenosis with neurogenic claudication   . ONYCHOMYCOSIS, TOENAILS 12/26/2007  . OSA on CPAP 10/16/2011  . Other malaise and fatigue 11/24/2009  . PERIPHERAL NEUROPATHY 06/18/2007  . Prostate cancer ([place])   . PSVT (paroxysmal supraventricular tachycardia) ([place])    a. 76/2831 complicated by NSTEMI;  b. 11/2016 Treated w/ adenosine in ED;  c. 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  [name] PULMONARY NODULE, RIGHT LOWER LOBE 06/08/2009  . Sleep apnea    cpap machine and o2  . TRANSAMINASES, SERUM, ELEVATED 02/01/2010  . Transfusion history    none recent  . Unspecified hypotension 01/30/2010    Past Surgical History:  Procedure Laterality Date  . A/V SHUNTOGRAM N/A 04/07/2018   Procedure: A/V SHUNTOGRAM;  Surgeon: Waynetta Sandy, MD;  Location: [place] CV LAB;  Service: Cardiovascular;  Laterality: N/A;  . ARTERIOVENOUS GRAFT PLACEMENT Right 2009   forearm/notes 02/01/2011  . AV FISTULA PLACEMENT  11/07/2011   Procedure: INSERTION OF ARTERIOVENOUS (AV) GORE-TEX GRAFT ARM;  Surgeon: Tinnie Gens, MD;  Location: [place];  Service: Vascular;  Laterality: Left;  . BACK SURGERY  1998  . BALLOON DILATION N/A 11/14/2018   Procedure: BALLOON DILATION;  Surgeon: Ronnette Juniper, MD;  Location: [place];  Service: Gastroenterology;  Laterality: N/A;  . BASCILIC VEIN TRANSPOSITION Right 02/27/2013   Procedure:  BASCILIC VEIN TRANSPOSITION;  Surgeon: Mal Misty, MD;  Location: [place];  Service: Vascular;  Laterality: Right;  Right Basilic Vein Transposition   . CARDIAC CATHETERIZATION N/A 08/06/2016   Procedure: Left Heart Cath and Coronary Angiography;  Surgeon: Jolaine Artist, MD;  Location: [place] CV LAB;  Service: Cardiovascular;  Laterality: N/A;  . CARDIAC CATHETERIZATION N/A 08/07/2016   Procedure: Coronary/Graft Atherectomy-CSI LAD;  Surgeon: Peter M Martinique, MD;  Location: [place] CV LAB;  Service: Cardiovascular;  Laterality: N/A;  . CERVICAL SPINE SURGERY  2/09   "to repair nerve problems in my left arm"  . CHOLECYSTECTOMY    . COLONOSCOPY WITH PROPOFOL N/A 04/26/2017   Procedure: COLONOSCOPY WITH PROPOFOL;  Surgeon: Otis Brace, MD;  Location: [place];  Service: Gastroenterology;  Laterality: N/A;  . CORONARY ANGIOPLASTY WITH STENT PLACEMENT  06/11/2008  . CORONARY ANGIOPLASTY WITH STENT PLACEMENT  06/2007   TAXUS stent to RCA/notes 01/31/2011  . ERCP N/A 11/14/2018   Procedure: ENDOSCOPIC RETROGRADE CHOLANGIOPANCREATOGRAPHY (ERCP);  Surgeon: Ronnette Juniper, MD;  Location: [place];  Service: Gastroenterology;  Laterality: N/A;  . ESOPHAGOGASTRODUODENOSCOPY  09/28/2011   Procedure: ESOPHAGOGASTRODUODENOSCOPY (EGD);  Surgeon: Missy Sabins, MD;  Location[facility] ENDOSCOPY;  Service: Endoscopy;  Laterality: N/A;  . ESOPHAGOGASTRODUODENOSCOPY N/A 04/07/2015   Procedure: ESOPHAGOGASTRODUODENOSCOPY (EGD);  Surgeon: Teena Irani, MD;  Location: [facility];  Service: Endoscopy;  Laterality: N/A;  . ESOPHAGOGASTRODUODENOSCOPY N/A 04/19/2015   Procedure: ESOPHAGOGASTRODUODENOSCOPY (EGD);  Surgeon: Arta Silence, MD;  Location: [facility] ENDOSCOPY;  Service: Endoscopy;  Laterality: N/A;  . ESOPHAGOGASTRODUODENOSCOPY  11/14/2018   Procedure: ESOPHAGOGASTRODUODENOSCOPY (EGD);  Surgeon: Ronnette Juniper, MD;  Location: [facility] ENDOSCOPY;  Service: Gastroenterology;;  . ESOPHAGOGASTRODUODENOSCOPY (EGD) WITH  PROPOFOL N/A 06/13/2018   Procedure: ESOPHAGOGASTRODUODENOSCOPY (EGD) WITH PROPOFOL;  Surgeon: Wonda Horner, MD;  Location: [facility] ENDOSCOPY;  Service: Endoscopy;  Laterality: N/A;  . FLEXIBLE SIGMOIDOSCOPY N/A 05/21/2017   Procedure: Conni Elliot;  Surgeon: Clarene Essex, MD;  Location: [place];  Service: Endoscopy;  Laterality: N/A;  . FLEXIBLE SIGMOIDOSCOPY Left 07/02/2017   Procedure: FLEXIBLE SIGMOIDOSCOPY;  Surgeon: Laurence Spates, MD;  Location: [place];  Service: Endoscopy;  Laterality: Left;  . FOREIGN BODY REMOVAL  09/2003   via upper endoscopy/notes 02/12/2011  . GIVENS CAPSULE STUDY  09/30/2011   Procedure: GIVENS CAPSULE STUDY;  Surgeon: Jeryl Columbia, MD;  Location: [facility] ENDOSCOPY;  Service: Endoscopy;  Laterality: N/A;  . INSERTION OF DIALYSIS CATHETER Right 2014  . INSERTION OF DIALYSIS CATHETER Left 02/11/2013   Procedure: INSERTION OF DIALYSIS CATHETER;  Surgeon: Conrad Salmon Brook, MD;  Location: [place];  Service: Vascular;  Laterality: Left;  Ultrasound guided  . IR AV DIALY SHUNT INTRO NEEDLE/INTRACATH INITIAL W/PTA/IMG RIGHT Right 11/14/2018  . IR US GUIDE VASC ACCESS RIGHT  11/14/2018  . LUMBAR LAMINECTOMY/DECOMPRESSION MICRODISCECTOMY Bilateral 07/31/2017   Procedure: LAMINECTOMY AND FORAMINOTOMY- BILATERAL LUMBAR TWO- LUMBAR THREE;  Surgeon: Earnie Larsson, MD;  Location: [place];  Service: Neurosurgery;  Laterality: Bilateral;  LAMINECTOMY AND FORAMINOTOMY- BILATERAL LUMBAR 2- LUMBAR 3  . PERIPHERAL VASCULAR BALLOON ANGIOPLASTY  04/07/2018   Procedure: PERIPHERAL VASCULAR BALLOON ANGIOPLASTY;  Surgeon: Waynetta Sandy, MD;  Location: [place] CV LAB;  Service: Cardiovascular;;  RUE AVF  . REMOVAL OF A DIALYSIS CATHETER Right 02/11/2013   Procedure: REMOVAL OF A DIALYSIS CATHETER;  Surgeon: Conrad [place], MD;  Location: [place];  Service: Vascular;  Laterality: Right;  . REMOVAL OF STONES  11/14/2018   Procedure: REMOVAL OF STONES;  Surgeon: Ronnette Juniper, MD;   Location: [place];  Service: Gastroenterology;;  . Azzie Almas DILATION N/A 04/07/2015   Procedure: Azzie Almas DILATION;  Surgeon: Teena Irani, MD;  Location: [facility] ENDOSCOPY;  Service: Endoscopy;  Laterality: N/A;  . SHUNTOGRAM N/A 09/20/2011   Procedure: Earney Mallet;  Surgeon: Conrad [place], MD;  Location: [facility] CATH LAB;  Service: Cardiovascular;  Laterality: N/A;  . SPHINCTEROTOMY  11/14/2018   Procedure: SPHINCTEROTOMY;  Surgeon: Ronnette Juniper, MD;  Location: [facility] ENDOSCOPY;  Service: Gastroenterology;;  . SVT ABLATION N/A 11/26/2016   Procedure: SVT Ablation;  Surgeon: Will Meredith Leeds, MD;  Location: [place] CV LAB;  Service: Cardiovascular;  Laterality: N/A;  . TEE WITHOUT CARDIOVERSION N/A 08/25/2018   Procedure: TRANSESOPHAGEAL ECHOCARDIOGRAM (TEE);  Surgeon: Dorothy Spark, MD;  Location: [facility] ENDOSCOPY;  Service: Cardiovascular;  Laterality: N/A;  . TONSILLECTOMY    . TOTAL KNEE ARTHROPLASTY Right 08/02/2015   Procedure: TOTAL KNEE ARTHROPLASTY;  Surgeon: Renette Butters, MD;  Location: [place];  Service: Orthopedics;  Laterality: Right;  . VENOGRAM N/A 01/26/2013   Procedure: VENOGRAM;  Surgeon: Angelia Mould, MD;  Location: [facility] CATH LAB;  Service: Cardiovascular;  Laterality: N/A;    Social History   Socioeconomic History  . Marital status: Married    Spouse name: Not on file  . Number of children: 3  . Years of education: 70  . Highest education level: Not on file  Occupational History  . Occupation: disabled    Employer: DISABLED  . Occupation: formerly Lawyer for [employer].  Social Needs  . Financial resource strain: Not on file  . Food insecurity:    Worry: Not on file    Inability: Not on file  . Transportation needs:    Medical: Not on file    Non-medical: Not on file  Tobacco Use  . Smoking status: Former Smoker    Packs/day: 1.00    Years: 25.00    Pack years: 25.00    Types: Cigarettes    Last attempt to quit: 10/01/2005    Years since quitting:  13.4  . Smokeless tobacco: Former Systems developer    Quit date: 10/01/2005  . Tobacco comment: Quit smoking 2007 Smoked x 25 years 1/2 ppd.  Substance and Sexual Activity  . Alcohol use: No    Alcohol/week: 0.0 standard drinks  . Drug use: No  . Sexual activity: Never  Lifestyle  . Physical activity:    Days per week: 2 days    Minutes per session: 10 min  . Stress: Only a little  Relationships  . Social connections:    Talks on phone: Three times a week    Gets together: Once a week    Attends religious service: 1 to 4 times per year    Active member of club or organization: No    Attends meetings of clubs or organizations: Never    Relationship status: Married  . Intimate partner violence:    Fear of current or  ex partner: Not on file    Emotionally abused: Not on file    Physically abused: Not on file    Forced sexual activity: Not on file  Other Topics Concern  . Not on file  Social History Narrative   Lives with wife   Caffeine use: Tea sometimes   No coffee   Left handed     Current Outpatient Medications on File Prior to Visit  Medication Sig Dispense Refill  . acetaminophen (TYLENOL) 325 MG tablet Take 650 mg by mouth every 6 (six) hours as needed for mild pain or headache.     . Alirocumab (PRALUENT) 150 MG/ML SOPN Inject 150 mg into the skin every 14 (fourteen) days. 2 pen 12  . allopurinol (ZYLOPRIM) 100 MG tablet Take 1 tablet by mouth once daily 90 tablet 0  . aspirin EC 325 MG tablet Take 1 tablet (325 mg total) by mouth daily. 30 tablet 0  . betamethasone dipropionate (DIPROLENE) 0.05 % cream Apply 1 application topically daily as needed (leg sores).    . citalopram (CELEXA) 10 MG tablet Take 1 tablet (10 mg total) by mouth at bedtime. 90 tablet 3  . colchicine 0.6 MG tablet Take 1 tablet (0.6 mg total) by mouth daily. 90 tablet 3  . doxercalciferol ([name]) 4 MCG/2ML injection Inject 1 mL (2 mcg total) into the vein Every Tuesday,Thursday,and Saturday with dialysis.  2 mL 0  . ezetimibe (ZETIA) 10 MG tablet TAKE ONE TABLET BY MOUTH ONCE DAILY 90 tablet 3  . ferric citrate (AURYXIA) 1 GM 210 MG(Fe) tablet Take 2 tablets (420 mg total) by mouth 3 (three) times daily with meals. 60 tablet 0  . gabapentin (NEURONTIN) 100 MG capsule Take 1 capsule (100 mg total) by mouth at bedtime. 30 capsule 0  . gabapentin (NEURONTIN) 300 MG capsule TAKE 1 TO 2 CAPSULES BY MOUTH AT BEDTIME AS NEEDED FOR PAIN AND FOR SLEEP 180 capsule 0  . hydroxypropyl methylcellulose / hypromellose (ISOPTO TEARS / GONIOVISC) 2.5 % ophthalmic solution Place 1 drop into both eyes 3 (three) times daily as needed for dry eyes. 15 mL 11  . methimazole (TAPAZOLE) 5 MG tablet Take 0.5 tablets (2.5 mg total) by mouth daily. 45 tablet 3  . multivitamin (RENA-VIT) TABS tablet Take 1 tablet by mouth daily.     . Nutritional Supplements (FEEDING SUPPLEMENT, NEPRO CARB STEADY,) LIQD Take 237 mLs by mouth 2 (two) times daily between meals. 7 Can 0  . pantoprazole (PROTONIX) 40 MG tablet Take 40 mg by mouth at bedtime.     . polyethylene glycol (MIRALAX / GLYCOLAX) packet Take 17 g by mouth daily. (Patient taking differently: Take 17 g by mouth daily as needed for mild constipation or moderate constipation. ) 14 each 0  . SENSIPAR 90 MG tablet Take 90 mg by mouth 2 (two) times daily.    [name] zolpidem (AMBIEN) 5 MG tablet Take 1 tablet (5 mg total) by mouth at bedtime as needed. for sleep 90 tablet 1   No current facility-administered medications on file prior to visit.     Allergies  Allergen Reactions  . Statins Other (See Comments)    Weak muscles  . Ciprofloxacin Rash    Family History  Problem Relation Age of Onset  . Heart disease Sister   . Thyroid nodules Sister   . Heart disease Father   . Diabetes Father   . Kidney failure Father   . Hypertension Father   . Healthy Child   .  Healthy Child   . Healthy Child   . Cancer Neg Hx      Review of Systems Denies fever.      Objective:    Physical Exam   Lab Results  Component Value Date   CREATININE 11.25 (H) 11/16/2018   BUN 59 (H) 11/16/2018   NA 138 11/16/2018   K 4.2 11/16/2018   CL 102 11/16/2018   CO2 25 11/16/2018       Assessment & Plan:  Type 2 DM: apparently well-controlled.  he declines labs now, but he requests cheaper medication.   Patient Instructions  I have sent a prescription to your pharmacy, to change welchol to colestipol. Please come back for a follow-up appointment in 1-2 months.

## 2019-02-18 NOTE — Patient Instructions (Addendum)
I have sent a prescription to your pharmacy, to change welchol to colestipol. Please come back for a follow-up appointment in 1-2 months.

## 2019-02-19 DIAGNOSIS — D631 Anemia in chronic kidney disease: Secondary | ICD-10-CM | POA: Diagnosis not present

## 2019-02-19 DIAGNOSIS — N2581 Secondary hyperparathyroidism of renal origin: Secondary | ICD-10-CM | POA: Diagnosis not present

## 2019-02-19 DIAGNOSIS — N186 End stage renal disease: Secondary | ICD-10-CM | POA: Diagnosis not present

## 2019-02-19 DIAGNOSIS — Z23 Encounter for immunization: Secondary | ICD-10-CM | POA: Diagnosis not present

## 2019-02-19 DIAGNOSIS — E119 Type 2 diabetes mellitus without complications: Secondary | ICD-10-CM | POA: Diagnosis not present

## 2019-02-21 DIAGNOSIS — N186 End stage renal disease: Secondary | ICD-10-CM | POA: Diagnosis not present

## 2019-02-21 DIAGNOSIS — N2581 Secondary hyperparathyroidism of renal origin: Secondary | ICD-10-CM | POA: Diagnosis not present

## 2019-02-21 DIAGNOSIS — D631 Anemia in chronic kidney disease: Secondary | ICD-10-CM | POA: Diagnosis not present

## 2019-02-21 DIAGNOSIS — Z23 Encounter for immunization: Secondary | ICD-10-CM | POA: Diagnosis not present

## 2019-02-21 DIAGNOSIS — E119 Type 2 diabetes mellitus without complications: Secondary | ICD-10-CM | POA: Diagnosis not present

## 2019-02-23 DIAGNOSIS — B9689 Other specified bacterial agents as the cause of diseases classified elsewhere: Secondary | ICD-10-CM | POA: Diagnosis not present

## 2019-02-23 DIAGNOSIS — A0472 Enterocolitis due to Clostridium difficile, not specified as recurrent: Secondary | ICD-10-CM | POA: Diagnosis not present

## 2019-02-23 DIAGNOSIS — I132 Hypertensive heart and chronic kidney disease with heart failure and with stage 5 chronic kidney disease, or end stage renal disease: Secondary | ICD-10-CM | POA: Diagnosis not present

## 2019-02-23 DIAGNOSIS — I5042 Chronic combined systolic (congestive) and diastolic (congestive) heart failure: Secondary | ICD-10-CM | POA: Diagnosis not present

## 2019-02-23 DIAGNOSIS — Z48815 Encounter for surgical aftercare following surgery on the digestive system: Secondary | ICD-10-CM | POA: Diagnosis not present

## 2019-02-23 DIAGNOSIS — T82898D Other specified complication of vascular prosthetic devices, implants and grafts, subsequent encounter: Secondary | ICD-10-CM | POA: Diagnosis not present

## 2019-02-24 DIAGNOSIS — E119 Type 2 diabetes mellitus without complications: Secondary | ICD-10-CM | POA: Diagnosis not present

## 2019-02-24 DIAGNOSIS — N186 End stage renal disease: Secondary | ICD-10-CM | POA: Diagnosis not present

## 2019-02-24 DIAGNOSIS — Z23 Encounter for immunization: Secondary | ICD-10-CM | POA: Diagnosis not present

## 2019-02-24 DIAGNOSIS — D631 Anemia in chronic kidney disease: Secondary | ICD-10-CM | POA: Diagnosis not present

## 2019-02-24 DIAGNOSIS — N2581 Secondary hyperparathyroidism of renal origin: Secondary | ICD-10-CM | POA: Diagnosis not present

## 2019-02-25 DIAGNOSIS — T82898D Other specified complication of vascular prosthetic devices, implants and grafts, subsequent encounter: Secondary | ICD-10-CM | POA: Diagnosis not present

## 2019-02-25 DIAGNOSIS — Z48815 Encounter for surgical aftercare following surgery on the digestive system: Secondary | ICD-10-CM | POA: Diagnosis not present

## 2019-02-25 DIAGNOSIS — I132 Hypertensive heart and chronic kidney disease with heart failure and with stage 5 chronic kidney disease, or end stage renal disease: Secondary | ICD-10-CM | POA: Diagnosis not present

## 2019-02-25 DIAGNOSIS — I5042 Chronic combined systolic (congestive) and diastolic (congestive) heart failure: Secondary | ICD-10-CM | POA: Diagnosis not present

## 2019-02-25 DIAGNOSIS — A0472 Enterocolitis due to Clostridium difficile, not specified as recurrent: Secondary | ICD-10-CM | POA: Diagnosis not present

## 2019-02-25 DIAGNOSIS — B9689 Other specified bacterial agents as the cause of diseases classified elsewhere: Secondary | ICD-10-CM | POA: Diagnosis not present

## 2019-02-26 ENCOUNTER — Other Ambulatory Visit: Payer: Self-pay | Admitting: Internal Medicine

## 2019-02-26 DIAGNOSIS — E119 Type 2 diabetes mellitus without complications: Secondary | ICD-10-CM | POA: Diagnosis not present

## 2019-02-26 DIAGNOSIS — N186 End stage renal disease: Secondary | ICD-10-CM | POA: Diagnosis not present

## 2019-02-26 DIAGNOSIS — D631 Anemia in chronic kidney disease: Secondary | ICD-10-CM | POA: Diagnosis not present

## 2019-02-26 DIAGNOSIS — Z23 Encounter for immunization: Secondary | ICD-10-CM | POA: Diagnosis not present

## 2019-02-26 DIAGNOSIS — N2581 Secondary hyperparathyroidism of renal origin: Secondary | ICD-10-CM | POA: Diagnosis not present

## 2019-02-27 ENCOUNTER — Other Ambulatory Visit: Payer: Self-pay | Admitting: *Deleted

## 2019-02-27 ENCOUNTER — Encounter: Payer: Self-pay | Admitting: *Deleted

## 2019-02-27 ENCOUNTER — Other Ambulatory Visit: Payer: Self-pay | Admitting: Pharmacy Technician

## 2019-02-27 NOTE — Patient Outreach (Signed)
[facility]) Care Management  [place]  02/27/2019   [name] September 06, 1951 [id number]    [place] Initial Assessment   Referral Date:  01/16/2019 Referral Source:  Transfer from [place] Reason for Referral:  Continued Disease Management Insurance:  Medicare    Outreach Attempt:  Successful telephone outreach to patient for introduction and initial telephone assessment.  HIPAA verified with patient.  RN Health Coach introduced self and role.  Patient verbally agrees to Disease Management outreaches.  Wife and patient completed initial telephone assessment.  Social:  Patient lives at home with wife.  Reports being independent with ADLs and wife assisting with IADLs.  Ambulating with cane and walker and reporting about 3 falls without injury in the last year.  Patient reporting he is still receiving home health physical therapy with [facility].  Wife transports to medical appointments.  DME in the home include:  Glasses, rolling walker, cane, CBG meter, blood pressure cuff, bedside commode, shower chair, CPAP, and scale.  Conditions:  Per chart review and discussion with patient and wife, PMH include but not limited to:  Anemia, benign prostatic hypertrophy, coronary artery disease with stent placement, chronic combined systolic and diastolic heart failure, dementia, diabetes, depression, dyslipidemia, end stage renal disease with hemodialysis on Tuesday, Thursday, and Saturdays, GERD, thyroid goiter with hyperthyroidism, hyperparathyroidism, hypertension, ischemic cardiomyopathy, peripheral neuropathy, obstructive sleep apnea with CPAP, peripheral neuropathy, prostate cancer and paroxysmal supraventricular tachycardia.  Patient reports compliance with hemodialysis on his scheduled days.  States he weighs about every other day.  Monitors his blood sugars about every other day.  Patient reporting his blood sugars have been ranging in 140's.   Last Hgb A1C was 6.09 November 2018.  Reports taking no medications for his diabetes at this time.  Wife reports patient having hypotensive episodes with hemodialysis at times.  Medications:   Patient reports taking about 9 medications daily.  States he and his wife manages his medications without difficulties.  He fills weekly pill box.  [place] is assisting with medication assistance forms for Zetia.  Encounter Medications:  Outpatient Encounter Medications as of 02/27/2019  Medication Sig Note  . acetaminophen (TYLENOL) 325 MG tablet Take 650 mg by mouth every 6 (six) hours as needed for mild pain or headache.    . Alirocumab (PRALUENT) 150 MG/ML SOPN Inject 150 mg into the skin every 14 (fourteen) days.   [name] allopurinol (ZYLOPRIM) 100 MG tablet Take 1 tablet by mouth once daily   . aspirin EC 325 MG tablet Take 1 tablet (325 mg total) by mouth daily.   . betamethasone dipropionate (DIPROLENE) 0.05 % cream Apply 1 application topically daily as needed (leg sores).   . citalopram (CELEXA) 10 MG tablet Take 1 tablet (10 mg total) by mouth at bedtime.   . colestipol (COLESTID) 1 g tablet Take 3 tablets (3 g total) by mouth daily.   [name] doxercalciferol ([name]) 4 MCG/2ML injection Inject 1 mL (2 mcg total) into the vein Every Tuesday,Thursday,and Saturday with dialysis. 11/11/2018: Given at dialysis  . ezetimibe (ZETIA) 10 MG tablet TAKE ONE TABLET BY MOUTH ONCE DAILY   . ferric citrate (AURYXIA) 1 GM 210 MG(Fe) tablet Take 2 tablets (420 mg total) by mouth 3 (three) times daily with meals.   . gabapentin (NEURONTIN) 300 MG capsule TAKE 1 TO 2 CAPSULES BY MOUTH AT BEDTIME AS NEEDED FOR PAIN AND FOR SLEEP   . hydroxypropyl methylcellulose / hypromellose (ISOPTO TEARS /  GONIOVISC) 2.5 % ophthalmic solution Place 1 drop into both eyes 3 (three) times daily as needed for dry eyes.   . methimazole (TAPAZOLE) 5 MG tablet Take 0.5 tablets (2.5 mg total) by mouth daily.   . multivitamin (RENA-VIT) TABS  tablet Take 1 tablet by mouth daily.    . Nutritional Supplements (FEEDING SUPPLEMENT, NEPRO CARB STEADY,) LIQD Take 237 mLs by mouth 2 (two) times daily between meals.   . pantoprazole (PROTONIX) 40 MG tablet Take 40 mg by mouth at bedtime.    . polyethylene glycol (MIRALAX / GLYCOLAX) packet Take 17 g by mouth daily. (Patient taking differently: Take 17 g by mouth daily as needed for mild constipation or moderate constipation. )   . SENSIPAR 90 MG tablet Take 90 mg by mouth 2 (two) times daily.   [name] zolpidem (AMBIEN) 5 MG tablet Take 1 tablet (5 mg total) by mouth at bedtime as needed. for sleep   . colchicine 0.6 MG tablet Take 1 tablet (0.6 mg total) by mouth daily. (Patient not taking: Reported on 02/27/2019) 02/27/2019: Wife reports patient not taking due to medication being expensive  . gabapentin (NEURONTIN) 100 MG capsule Take 1 capsule (100 mg total) by mouth at bedtime. 04/06/2375: Duplicate   No facility-administered encounter medications on file as of 02/27/2019.     Functional Status:  In your present state of health, do you have any difficulty performing the following activities: 02/27/2019 11/11/2018  Hearing? Y N  Comment some trouble hearing -  Vision? Y N  Comment has glasses but he does not wear often -  Difficulty concentrating or making decisions? Y N  Comment forgetful -  Walking or climbing stairs? Y N  Comment weak and unable to climb stairs, walks with cane or walker -  Dressing or bathing? N N  Doing errands, shopping? Y N  Comment has to use scooter to shop -  Conservation officer, nature and eating ? Y -  Comment wife cooks -  Using [employer]? N -  In the past six months, have you accidently leaked urine? N -  Comment anuric -  Do you have problems with loss of bowel control? N -  Managing your Medications? Y -  Comment wife assist managing his medications -  Managing your Finances? Y -  Comment wife assist with managing finances -  Housekeeping or managing your  Housekeeping? Y -  Comment wife cleans home -  Some recent data might be hidden    Fall/Depression Screening: Fall Risk  02/27/2019 09/17/2018 07/10/2017  Falls in the past year? 1 1 Yes  Comment - - -  Number falls in past yr: 1 1 2  or more  Injury with Fall? 0 0 Yes  Risk Factor Category  - - High Fall Risk  Risk for fall due to : History of fall(s);Medication side effect;Impaired balance/gait;Impaired mobility;Impaired vision History of fall(s) History of fall(s);Impaired balance/gait;Impaired mobility  Follow up Falls evaluation completed;Education provided;Falls prevention discussed Falls prevention discussed;Education provided;Falls evaluation completed Falls evaluation completed;Falls prevention discussed;Follow up appointment   PHQ 2/9 Scores 02/27/2019 11/19/2018 09/17/2018 02/07/2017 02/01/2017 10/15/2016 12/29/2015  PHQ - 2 Score 1 0 0 6 1 2  0  PHQ- 9 Score - - - 11 - 6 -   THN CM Care Plan Problem One     Most Recent Value  Care Plan Problem One  Knowledge deficiet related to self care management of diabetes and fall prevention  Role Documenting the Problem One  Health Coach  Care Plan for Problem One  Active  THN Long Term Goal   Patient will have no hospitalizations in the next 90 days.  THN Long Term Goal Start Date  02/27/19  Interventions for Problem One Long Term Goal  Reviewed and discussed current care plan and goals, encouraged to keep and attend scheduled medical appointments, reviewed medications and encouraged medication compliance, fall precautions and preventions reviewed and discussed, discussed and encouraged continued social distancing and safer at home instructions, encouraged patient to continue to work with home therapy to assist with stregnthening, encouraged patient to weigh daily, encouraged compliance with hemodialysis  THN CM Short Term Goal #1   Patient will report maintaining Hgb A1C of below 7 in the next 90 days.  THN CM Short Term Goal #1 Start Date   02/27/19  Interventions for Short Term Goal #1  Reviewed current Hgb A1C and congratulated on maintaining goal, encouraged to continue to monitor blood sugars and notify provider for elevations, discussed and encouraged diabetic renal low sodium diet, reviewed signs and symptoms of hypo and hyperglycemia, discussed with wife to request prescription for new CBG meter with endocrinoloigst     Appointments:  Attended appointment with Dr. [name], primary care provider on 11/26/2018.  Attended telehealth appointment with Dr. Loanne Drilling, Endocrinologist on 02/18/2019 and has scheduled follow up appointment on 04/06/2019.  Advanced Directives:  Denies having advance directive in place and states they already have information to create one.   Consent:  [facility] services reviewed and discussed. Patient and wife verbally agrees to Disease Management outreaches.  Patient is working with [facility] for medication assistance.  Plan: RN Health Coach will send primary MD barriers letter. RN Health Coach will route initial telephone assessment note to primary MD. [place] will make next telephone outreach to patient within the month of July.  Ursina [phone] [email or web address]

## 2019-02-27 NOTE — Patient Outreach (Signed)
[facility]) Care Management  02/27/2019  [name] 03-15-1951 [id number]  Care coordination call placed to [employer] patient assistance in regards to patient's application for Zetia.  Spoke to [place] who informed they had received the application and had mailed out the attestation letter to the patient on 02/24/2019. Rosia informed it can take anywhere form 7-14 business days to arrive at the patient's home. Once [employer] has received the letter back then they can continue to process the application.  Successful outreach call placed to patient's wife [name]. HIPAA identifiers verified.  Informed patient's wife to be expecting mail from [facility] for the patient. Informed her to call me when she has received this letter so we can discuss it together. Patient verbalized understanding.  Will followup with patient in 10-14 business days if call is not returned.  [name], [place] Management [phone]

## 2019-02-28 DIAGNOSIS — Z23 Encounter for immunization: Secondary | ICD-10-CM | POA: Diagnosis not present

## 2019-02-28 DIAGNOSIS — N186 End stage renal disease: Secondary | ICD-10-CM | POA: Diagnosis not present

## 2019-02-28 DIAGNOSIS — N2581 Secondary hyperparathyroidism of renal origin: Secondary | ICD-10-CM | POA: Diagnosis not present

## 2019-02-28 DIAGNOSIS — E119 Type 2 diabetes mellitus without complications: Secondary | ICD-10-CM | POA: Diagnosis not present

## 2019-02-28 DIAGNOSIS — D631 Anemia in chronic kidney disease: Secondary | ICD-10-CM | POA: Diagnosis not present

## 2019-03-02 DIAGNOSIS — B9689 Other specified bacterial agents as the cause of diseases classified elsewhere: Secondary | ICD-10-CM | POA: Diagnosis not present

## 2019-03-02 DIAGNOSIS — T82898D Other specified complication of vascular prosthetic devices, implants and grafts, subsequent encounter: Secondary | ICD-10-CM | POA: Diagnosis not present

## 2019-03-02 DIAGNOSIS — N186 End stage renal disease: Secondary | ICD-10-CM | POA: Diagnosis not present

## 2019-03-02 DIAGNOSIS — E119 Type 2 diabetes mellitus without complications: Secondary | ICD-10-CM | POA: Diagnosis not present

## 2019-03-02 DIAGNOSIS — A0472 Enterocolitis due to Clostridium difficile, not specified as recurrent: Secondary | ICD-10-CM | POA: Diagnosis not present

## 2019-03-02 DIAGNOSIS — Z48815 Encounter for surgical aftercare following surgery on the digestive system: Secondary | ICD-10-CM | POA: Diagnosis not present

## 2019-03-02 DIAGNOSIS — E1129 Type 2 diabetes mellitus with other diabetic kidney complication: Secondary | ICD-10-CM | POA: Diagnosis not present

## 2019-03-02 DIAGNOSIS — I5042 Chronic combined systolic (congestive) and diastolic (congestive) heart failure: Secondary | ICD-10-CM | POA: Diagnosis not present

## 2019-03-02 DIAGNOSIS — N2581 Secondary hyperparathyroidism of renal origin: Secondary | ICD-10-CM | POA: Diagnosis not present

## 2019-03-02 DIAGNOSIS — I132 Hypertensive heart and chronic kidney disease with heart failure and with stage 5 chronic kidney disease, or end stage renal disease: Secondary | ICD-10-CM | POA: Diagnosis not present

## 2019-03-02 DIAGNOSIS — Z992 Dependence on renal dialysis: Secondary | ICD-10-CM | POA: Diagnosis not present

## 2019-03-02 DIAGNOSIS — D631 Anemia in chronic kidney disease: Secondary | ICD-10-CM | POA: Diagnosis not present

## 2019-03-03 DIAGNOSIS — N2581 Secondary hyperparathyroidism of renal origin: Secondary | ICD-10-CM | POA: Diagnosis not present

## 2019-03-03 DIAGNOSIS — N186 End stage renal disease: Secondary | ICD-10-CM | POA: Diagnosis not present

## 2019-03-03 DIAGNOSIS — D631 Anemia in chronic kidney disease: Secondary | ICD-10-CM | POA: Diagnosis not present

## 2019-03-03 DIAGNOSIS — E119 Type 2 diabetes mellitus without complications: Secondary | ICD-10-CM | POA: Diagnosis not present

## 2019-03-04 ENCOUNTER — Other Ambulatory Visit: Payer: Self-pay | Admitting: Pharmacy Technician

## 2019-03-04 ENCOUNTER — Telehealth: Payer: Self-pay | Admitting: Internal Medicine

## 2019-03-04 DIAGNOSIS — Z48815 Encounter for surgical aftercare following surgery on the digestive system: Secondary | ICD-10-CM | POA: Diagnosis not present

## 2019-03-04 DIAGNOSIS — A0472 Enterocolitis due to Clostridium difficile, not specified as recurrent: Secondary | ICD-10-CM | POA: Diagnosis not present

## 2019-03-04 DIAGNOSIS — B9689 Other specified bacterial agents as the cause of diseases classified elsewhere: Secondary | ICD-10-CM | POA: Diagnosis not present

## 2019-03-04 DIAGNOSIS — T82898D Other specified complication of vascular prosthetic devices, implants and grafts, subsequent encounter: Secondary | ICD-10-CM | POA: Diagnosis not present

## 2019-03-04 DIAGNOSIS — I132 Hypertensive heart and chronic kidney disease with heart failure and with stage 5 chronic kidney disease, or end stage renal disease: Secondary | ICD-10-CM | POA: Diagnosis not present

## 2019-03-04 DIAGNOSIS — I5042 Chronic combined systolic (congestive) and diastolic (congestive) heart failure: Secondary | ICD-10-CM | POA: Diagnosis not present

## 2019-03-04 NOTE — Telephone Encounter (Signed)
Pt scheduled  

## 2019-03-04 NOTE — Telephone Encounter (Signed)
Pt needs an appointment for side pain, denies fever, denies constipation. Patient is a 9 on risk scale. Please advise on in office or telephone call. Patient cannot do virtual

## 2019-03-04 NOTE — Patient Outreach (Signed)
[facility]) Care Management  03/04/2019  [name] November 24, 1950 [id number]     Incoming call received from patient's wife in regards to [employer] patient assistance application for Zetia.  Spoke to patient's wife, HIPAA identifiers verified.  Patient's wife  was calling to inform that she received the attestation letter from [employer]. Discussed with patient's wife the letter and the questions that needed to be answered. Patient's wife  Verbalized she  was able to answer the questions and have him sign the form. Informed patient's wife to place all the information that was sent to him including the form into the pre paid envelope that [employer] sent and mail it back to [employer]. Informed patient's wife I would followup with [employer] to inquire if they have received it back and when the medications would be delivered. Patient's wife  verbalized understanding.  Will followup with [employer] in 10-14 business days to inquire if they have received the attestation letter and mailed out the medications.  [name] P. [name], [place] Management [phone]

## 2019-03-04 NOTE — Telephone Encounter (Signed)
A phone visit would be more beneficial for this patient. Please schedule.

## 2019-03-05 ENCOUNTER — Ambulatory Visit: Payer: Medicare Other | Admitting: Internal Medicine

## 2019-03-05 DIAGNOSIS — N186 End stage renal disease: Secondary | ICD-10-CM | POA: Diagnosis not present

## 2019-03-05 DIAGNOSIS — N2581 Secondary hyperparathyroidism of renal origin: Secondary | ICD-10-CM | POA: Diagnosis not present

## 2019-03-05 DIAGNOSIS — D631 Anemia in chronic kidney disease: Secondary | ICD-10-CM | POA: Diagnosis not present

## 2019-03-05 DIAGNOSIS — E119 Type 2 diabetes mellitus without complications: Secondary | ICD-10-CM | POA: Diagnosis not present

## 2019-03-06 ENCOUNTER — Ambulatory Visit (INDEPENDENT_AMBULATORY_CARE_PROVIDER_SITE_OTHER): Payer: Medicare Other | Admitting: Internal Medicine

## 2019-03-06 DIAGNOSIS — I639 Cerebral infarction, unspecified: Secondary | ICD-10-CM | POA: Diagnosis not present

## 2019-03-06 DIAGNOSIS — E1122 Type 2 diabetes mellitus with diabetic chronic kidney disease: Secondary | ICD-10-CM | POA: Diagnosis not present

## 2019-03-06 DIAGNOSIS — K219 Gastro-esophageal reflux disease without esophagitis: Secondary | ICD-10-CM | POA: Diagnosis not present

## 2019-03-06 DIAGNOSIS — N184 Chronic kidney disease, stage 4 (severe): Secondary | ICD-10-CM

## 2019-03-06 DIAGNOSIS — R1011 Right upper quadrant pain: Secondary | ICD-10-CM

## 2019-03-06 MED ORDER — TRAMADOL HCL 50 MG PO TABS: 50.0000 mg | ORAL_TABLET | Freq: Four times a day (QID) | ORAL | 0 refills | Status: DC | PRN

## 2019-03-06 MED ORDER — POLYETHYLENE GLYCOL 3350 17 G PO PACK: 17.0000 g | PACK | Freq: Every day | ORAL | 11 refills | Status: DC

## 2019-03-06 NOTE — Patient Instructions (Signed)
Please take all new medication as prescribed - the tramadol for pain  Please take all new medication as prescribed - the miralax every day  Please continue all other medications as before, and refills have been done if requested.  Please have the pharmacy call with any other refills you may need.  Please continue your efforts at being more active, low cholesterol diet, and weight control  Please keep your appointments with your specialists as you may have planned

## 2019-03-06 NOTE — Progress Notes (Signed)
Patient ID: [name], male   DOB: [birth year], 68 y.o.   MRN: [id number]  Virtual Visit via Video Note  I connected with [name] on 03/06/19 at  4:00 PM EDT by a video enabled telemedicine application and verified that I am speaking with the correct person using two identifiers.  Location: Patient: at home Provider: at office   I discussed the limitations of evaluation and management by telemedicine and the availability of in person appointments. The patient expressed understanding and agreed to proceed.  History of Present Illness: Here with c/o RUQ pain/abd pain  X 1 mo, mild to mod, constant, mostly located at the lower right rib anterior rib cage with some tender soreness there; is particularly worse in the AM to first get up OOB, then somewhat better later in the day, not associated with fever, n/v, radiation and Denies worsening reflux, dysphagia, or blood.  Has intermittent constipation and currently not taking the daily miralax as he has in the past, not sure why, is willing to restart.  Pt denies chest pain, increased sob or doe, wheezing, orthopnea, PND, increased LE swelling, palpitations, dizziness or syncope.  Pt denies new neurological symptoms such as new headache, or facial or extremity weakness or numbness   Pt denies polydipsia, polyuria, Dementia overall stable, and not assoc with behavioral changes such as hallucinations, paranoia, or agitation per pt Past Medical History:  Diagnosis Date  . Acute encephalopathy 10/04/2018  . Allergic rhinitis, cause unspecified 02/24/2014  . Anemia 06/16/2011  . BENIGN PROSTATIC HYPERTROPHY 10/14/2009  . CAD, NATIVE VESSEL 02/06/2009   a. 06/2007 s/p Taxus DES to the RCA;  b. 08/2016 NSTEMI in setting of SVT/PCI: LM 30ost, LAD 46m (3.0x16 Synergy DES), LCX 12m, OM1 60, RI 40, RCA 70p/m, 38m - not amenable to PCI.  [name] Cervical radiculopathy, chronic 02/23/2016   Right c5-6 by NCS/EMG  . Chest pain 08/09/2015  . Chronic combined systolic  (congestive) and diastolic (congestive) heart failure ([place])    a. 10/2016 Echo: EF 40-45%, Gr2 DD. mildly dil LA.  [name] COLONIC POLYPS, HX OF 10/14/2009  . Dementia ([place]) [id number]  . Depression 09/24/2015  . DIABETES MELLITUS, TYPE II 02/01/2010  . DYSLIPIDEMIA 06/18/2007  . ESRD (end stage renal disease) on dialysis ([place]) 08/04/2010   ESRD due to DM adn HTN, started HD in 2011. As of 2019 has a R arm graft and gets HD on TTS sched  . FOOT PAIN 08/12/2008  . GAIT DISTURBANCE 03/03/2010  . GASTROENTERITIS, VIRAL 10/14/2009  . GERD 06/18/2007  . GOITER, MULTINODULAR 12/26/2007  . GOUT 06/18/2007  . GYNECOMASTIA 07/17/2010  . Hemodialysis access, fistula mature [facility])    Dialysis T-Th-Sa ([place]) Right upper arm fistula  . Hyperlipidemia 10/16/2011  . Hyperparathyroidism, secondary ([place]) 06/16/2011  . HYPERTENSION 06/18/2007  . Hyperthyroidism   . Hypocalcemia 06/07/2010  . Ischemic cardiomyopathy    a. 10/2016 Echo: EF 40-45%.  . Lumbar stenosis with neurogenic claudication   . ONYCHOMYCOSIS, TOENAILS 12/26/2007  . OSA on CPAP 10/16/2011  . Other malaise and fatigue 11/24/2009  . PERIPHERAL NEUROPATHY 06/18/2007  . Prostate cancer ([place])   . PSVT (paroxysmal supraventricular tachycardia) ([place])    a. 32/2025 complicated by NSTEMI;  b. 11/2016 Treated w/ adenosine in ED;  c. 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  [name] PULMONARY NODULE, RIGHT LOWER LOBE 06/08/2009  . Sleep apnea    cpap machine and o2  . TRANSAMINASES, SERUM, ELEVATED 02/01/2010  . Transfusion history  none recent  . Unspecified hypotension 01/30/2010   Past Surgical History:  Procedure Laterality Date  . A/V SHUNTOGRAM N/A 04/07/2018   Procedure: A/V SHUNTOGRAM;  Surgeon: Waynetta Sandy, MD;  Location: [place] [facility];  Service: Cardiovascular;  Laterality: N/A;  . ARTERIOVENOUS GRAFT PLACEMENT Right 2009   forearm/notes 02/01/2011  . AV FISTULA PLACEMENT  11/07/2011   Procedure: INSERTION OF ARTERIOVENOUS (AV) GORE-TEX  GRAFT ARM;  Surgeon: Tinnie Gens, MD;  Location: [place];  Service: Vascular;  Laterality: Left;  . BACK SURGERY  1998  . BALLOON DILATION N/A 11/14/2018   Procedure: BALLOON DILATION;  Surgeon: Ronnette Juniper, MD;  Location: [place];  Service: Gastroenterology;  Laterality: N/A;  . BASCILIC VEIN TRANSPOSITION Right 02/27/2013   Procedure: BASCILIC VEIN TRANSPOSITION;  Surgeon: Mal Misty, MD;  Location: [place];  Service: Vascular;  Laterality: Right;  Right Basilic Vein Transposition   . CARDIAC CATHETERIZATION N/A 08/06/2016   Procedure: Left Heart Cath and Coronary Angiography;  Surgeon: Jolaine Artist, MD;  Location: [place] [facility];  Service: Cardiovascular;  Laterality: N/A;  . CARDIAC CATHETERIZATION N/A 08/07/2016   Procedure: Coronary/Graft Atherectomy-CSI LAD;  Surgeon: Peter M Martinique, MD;  Location: [place] [facility];  Service: Cardiovascular;  Laterality: N/A;  . CERVICAL SPINE SURGERY  2/09   "to repair nerve problems in my left arm"  . CHOLECYSTECTOMY    . COLONOSCOPY WITH PROPOFOL N/A 04/26/2017   Procedure: COLONOSCOPY WITH PROPOFOL;  Surgeon: Otis Brace, MD;  Location: [place];  Service: Gastroenterology;  Laterality: N/A;  . CORONARY ANGIOPLASTY WITH STENT PLACEMENT  06/11/2008  . CORONARY ANGIOPLASTY WITH STENT PLACEMENT  06/2007   TAXUS stent to RCA/notes 01/31/2011  . ERCP N/A 11/14/2018   Procedure: ENDOSCOPIC RETROGRADE CHOLANGIOPANCREATOGRAPHY (ERCP);  Surgeon: Ronnette Juniper, MD;  Location: [place];  Service: Gastroenterology;  Laterality: N/A;  . ESOPHAGOGASTRODUODENOSCOPY  09/28/2011   Procedure: ESOPHAGOGASTRODUODENOSCOPY (EGD);  Surgeon: Missy Sabins, MD;  Location: [facility] ENDOSCOPY;  Service: Endoscopy;  Laterality: N/A;  . ESOPHAGOGASTRODUODENOSCOPY N/A 04/07/2015   Procedure: ESOPHAGOGASTRODUODENOSCOPY (EGD);  Surgeon: Teena Irani, MD;  Location: [facility];  Service: Endoscopy;  Laterality: N/A;  . ESOPHAGOGASTRODUODENOSCOPY N/A 04/19/2015    Procedure: ESOPHAGOGASTRODUODENOSCOPY (EGD);  Surgeon: Arta Silence, MD;  Location: [facility] ENDOSCOPY;  Service: Endoscopy;  Laterality: N/A;  . ESOPHAGOGASTRODUODENOSCOPY  11/14/2018   Procedure: ESOPHAGOGASTRODUODENOSCOPY (EGD);  Surgeon: Ronnette Juniper, MD;  Location: [facility] ENDOSCOPY;  Service: Gastroenterology;;  . ESOPHAGOGASTRODUODENOSCOPY (EGD) WITH PROPOFOL N/A 06/13/2018   Procedure: ESOPHAGOGASTRODUODENOSCOPY (EGD) WITH PROPOFOL;  Surgeon: Wonda Horner, MD;  Location: [facility] ENDOSCOPY;  Service: Endoscopy;  Laterality: N/A;  . FLEXIBLE SIGMOIDOSCOPY N/A 05/21/2017   Procedure: Conni Elliot;  Surgeon: Clarene Essex, MD;  Location: [place];  Service: Endoscopy;  Laterality: N/A;  . FLEXIBLE SIGMOIDOSCOPY Left 07/02/2017   Procedure: FLEXIBLE SIGMOIDOSCOPY;  Surgeon: Laurence Spates, MD;  Location: [place];  Service: Endoscopy;  Laterality: Left;  . FOREIGN BODY REMOVAL  09/2003   via upper endoscopy/notes 02/12/2011  . GIVENS CAPSULE STUDY  09/30/2011   Procedure: GIVENS CAPSULE STUDY;  Surgeon: Jeryl Columbia, MD;  Location: [facility] ENDOSCOPY;  Service: Endoscopy;  Laterality: N/A;  . INSERTION OF DIALYSIS CATHETER Right 2014  . INSERTION OF DIALYSIS CATHETER Left 02/11/2013   Procedure: INSERTION OF DIALYSIS CATHETER;  Surgeon: Conrad [place] Grove, MD;  Location: [place];  Service: Vascular;  Laterality: Left;  Ultrasound guided  . IR AV DIALY SHUNT INTRO NEEDLE/INTRACATH INITIAL W/PTA/IMG RIGHT Right 11/14/2018  .  IR US GUIDE VASC ACCESS RIGHT  11/14/2018  . LUMBAR LAMINECTOMY/DECOMPRESSION MICRODISCECTOMY Bilateral 07/31/2017   Procedure: LAMINECTOMY AND FORAMINOTOMY- BILATERAL LUMBAR TWO- LUMBAR THREE;  Surgeon: Earnie Larsson, MD;  Location: [place];  Service: Neurosurgery;  Laterality: Bilateral;  LAMINECTOMY AND FORAMINOTOMY- BILATERAL LUMBAR 2- LUMBAR 3  . PERIPHERAL VASCULAR BALLOON ANGIOPLASTY  04/07/2018   Procedure: PERIPHERAL VASCULAR BALLOON ANGIOPLASTY;  Surgeon: Waynetta Sandy,  MD;  Location: [place] [facility];  Service: Cardiovascular;;  RUE AVF  . REMOVAL OF A DIALYSIS CATHETER Right 02/11/2013   Procedure: REMOVAL OF A DIALYSIS CATHETER;  Surgeon: Conrad Maiden, MD;  Location: [place];  Service: Vascular;  Laterality: Right;  . REMOVAL OF STONES  11/14/2018   Procedure: REMOVAL OF STONES;  Surgeon: Ronnette Juniper, MD;  Location: [place];  Service: Gastroenterology;;  . Azzie Almas DILATION N/A 04/07/2015   Procedure: Azzie Almas DILATION;  Surgeon: Teena Irani, MD;  Location: [facility] ENDOSCOPY;  Service: Endoscopy;  Laterality: N/A;  . SHUNTOGRAM N/A 09/20/2011   Procedure: [place] Mallet;  Surgeon: Conrad Livingston, MD;  Location: [facility] CATH LAB;  Service: Cardiovascular;  Laterality: N/A;  . SPHINCTEROTOMY  11/14/2018   Procedure: SPHINCTEROTOMY;  Surgeon: Ronnette Juniper, MD;  Location: [facility] ENDOSCOPY;  Service: Gastroenterology;;  . SVT ABLATION N/A 11/26/2016   Procedure: SVT Ablation;  Surgeon: Will Meredith Leeds, MD;  Location: [place] [facility];  Service: Cardiovascular;  Laterality: N/A;  . TEE WITHOUT CARDIOVERSION N/A 08/25/2018   Procedure: TRANSESOPHAGEAL ECHOCARDIOGRAM (TEE);  Surgeon: Dorothy Spark, MD;  Location: [facility] ENDOSCOPY;  Service: Cardiovascular;  Laterality: N/A;  . TONSILLECTOMY    . TOTAL KNEE ARTHROPLASTY Right 08/02/2015   Procedure: TOTAL KNEE ARTHROPLASTY;  Surgeon: Renette Butters, MD;  Location: [place];  Service: Orthopedics;  Laterality: Right;  . VENOGRAM N/A 01/26/2013   Procedure: VENOGRAM;  Surgeon: Angelia Mould, MD;  Location: [facility] CATH LAB;  Service: Cardiovascular;  Laterality: N/A;    reports that he quit smoking about 13 years ago. His smoking use included cigarettes. He has a 25.00 pack-year smoking history. He quit smokeless tobacco use about 13 years ago. He reports that he does not drink alcohol or use drugs. family history includes Diabetes in his father; Healthy in his child, child, and child; Heart disease in his father and sister; Hypertension  in his father; Kidney failure in his father; Thyroid nodules in his sister. Allergies  Allergen Reactions  . Statins Other (See Comments)    Weak muscles  . Ciprofloxacin Rash   Current Outpatient Medications on File Prior to Visit  Medication Sig Dispense Refill  . acetaminophen (TYLENOL) 325 MG tablet Take 650 mg by mouth every 6 (six) hours as needed for mild pain or headache.     . Alirocumab (PRALUENT) 150 MG/ML SOPN Inject 150 mg into the skin every 14 (fourteen) days. 2 pen 12  . allopurinol (ZYLOPRIM) 100 MG tablet Take 1 tablet by mouth once daily 90 tablet 0  . aspirin EC 325 MG tablet Take 1 tablet (325 mg total) by mouth daily. 30 tablet 0  . betamethasone dipropionate (DIPROLENE) 0.05 % cream Apply 1 application topically daily as needed (leg sores).    . citalopram (CELEXA) 10 MG tablet Take 1 tablet (10 mg total) by mouth at bedtime. 90 tablet 3  . colchicine 0.6 MG tablet Take 1 tablet (0.6 mg total) by mouth daily. (Patient not taking: Reported on 02/27/2019) 90 tablet 3  . colestipol (COLESTID) 1 g tablet Take  3 tablets (3 g total) by mouth daily. 90 tablet 11  . doxercalciferol ([name]) 4 MCG/2ML injection Inject 1 mL (2 mcg total) into the vein Every Tuesday,Thursday,and Saturday with dialysis. 2 mL 0  . ezetimibe (ZETIA) 10 MG tablet TAKE ONE TABLET BY MOUTH ONCE DAILY 90 tablet 3  . ferric citrate (AURYXIA) 1 GM 210 MG(Fe) tablet Take 2 tablets (420 mg total) by mouth 3 (three) times daily with meals. 60 tablet 0  . gabapentin (NEURONTIN) 100 MG capsule Take 1 capsule (100 mg total) by mouth at bedtime. 30 capsule 0  . gabapentin (NEURONTIN) 300 MG capsule TAKE 1 TO 2 CAPSULES BY MOUTH AT BEDTIME AS NEEDED FOR PAIN AND FOR SLEEP 180 capsule 0  . hydroxypropyl methylcellulose / hypromellose (ISOPTO TEARS / GONIOVISC) 2.5 % ophthalmic solution Place 1 drop into both eyes 3 (three) times daily as needed for dry eyes. 15 mL 11  . methimazole (TAPAZOLE) 5 MG tablet Take 0.5  tablets (2.5 mg total) by mouth daily. 45 tablet 3  . multivitamin (RENA-VIT) TABS tablet Take 1 tablet by mouth daily.     . Nutritional Supplements (FEEDING SUPPLEMENT, NEPRO CARB STEADY,) LIQD Take 237 mLs by mouth 2 (two) times daily between meals. 7 Can 0  . pantoprazole (PROTONIX) 40 MG tablet Take 40 mg by mouth at bedtime.     . SENSIPAR 90 MG tablet Take 90 mg by mouth 2 (two) times daily.    [name] zolpidem (AMBIEN) 5 MG tablet Take 1 tablet (5 mg total) by mouth at bedtime as needed. for sleep 90 tablet 1   No current facility-administered medications on file prior to visit.     Observations/Objective: Alert, NAD, appropriate mood and affect, resps normal, cn 2-12 intact, moves all 4s, no visible rash or swelling Lab Results  Component Value Date   WBC 8.0 11/26/2018   HGB 12.0 (L) 11/26/2018   HCT 37.1 (L) 11/26/2018   PLT 309.0 11/26/2018   GLUCOSE 121 (H) 11/16/2018   CHOL 171 10/04/2018   TRIG 185 (H) 10/04/2018   HDL 66 10/04/2018   LDLDIRECT 136.0 07/16/2014   LDLCALC 68 10/04/2018   ALT 1 11/26/2018   AST 20 11/26/2018   NA 138 11/16/2018   K 4.2 11/16/2018   CL 102 11/16/2018   CREATININE 11.25 (H) 11/16/2018   BUN 59 (H) 11/16/2018   CO2 25 11/16/2018   TSH 0.978 11/11/2018   PSA 3.67 08/30/2014   INR 0.99 11/14/2018   HGBA1C 6.9 (A) 11/19/2018   Assessment and Plan: See notes  Follow Up Instructions: See notes   I discussed the assessment and treatment plan with the patient. The patient was provided an opportunity to ask questions and all were answered. The patient agreed with the plan and demonstrated an understanding of the instructions.   The patient was advised to call back or seek an in-person evaluation if the symptoms worsen or if the condition fails to improve as anticipated.   [name], MD

## 2019-03-07 ENCOUNTER — Encounter: Payer: Self-pay | Admitting: Internal Medicine

## 2019-03-07 DIAGNOSIS — R1011 Right upper quadrant pain: Secondary | ICD-10-CM | POA: Insufficient documentation

## 2019-03-07 DIAGNOSIS — D631 Anemia in chronic kidney disease: Secondary | ICD-10-CM | POA: Diagnosis not present

## 2019-03-07 DIAGNOSIS — N2581 Secondary hyperparathyroidism of renal origin: Secondary | ICD-10-CM | POA: Diagnosis not present

## 2019-03-07 DIAGNOSIS — E119 Type 2 diabetes mellitus without complications: Secondary | ICD-10-CM | POA: Diagnosis not present

## 2019-03-07 DIAGNOSIS — N186 End stage renal disease: Secondary | ICD-10-CM | POA: Diagnosis not present

## 2019-03-07 NOTE — Assessment & Plan Note (Signed)
Etiology unclear, but suspect MSK costochondritis +/- constipation vs other; declines labs or ct, will advise to take miralax daily as prescribed, and tramadol prn,  to f/u any worsening symptoms or concerns

## 2019-03-07 NOTE — Assessment & Plan Note (Signed)
stable overall by history and exam, recent data reviewed with pt, and pt to continue medical treatment as before,  to f/u any worsening symptoms or concerns  

## 2019-03-09 DIAGNOSIS — A0472 Enterocolitis due to Clostridium difficile, not specified as recurrent: Secondary | ICD-10-CM | POA: Diagnosis not present

## 2019-03-09 DIAGNOSIS — I5042 Chronic combined systolic (congestive) and diastolic (congestive) heart failure: Secondary | ICD-10-CM | POA: Diagnosis not present

## 2019-03-09 DIAGNOSIS — I132 Hypertensive heart and chronic kidney disease with heart failure and with stage 5 chronic kidney disease, or end stage renal disease: Secondary | ICD-10-CM | POA: Diagnosis not present

## 2019-03-09 DIAGNOSIS — T82898D Other specified complication of vascular prosthetic devices, implants and grafts, subsequent encounter: Secondary | ICD-10-CM | POA: Diagnosis not present

## 2019-03-09 DIAGNOSIS — Z48815 Encounter for surgical aftercare following surgery on the digestive system: Secondary | ICD-10-CM | POA: Diagnosis not present

## 2019-03-09 DIAGNOSIS — B9689 Other specified bacterial agents as the cause of diseases classified elsewhere: Secondary | ICD-10-CM | POA: Diagnosis not present

## 2019-03-10 DIAGNOSIS — N2581 Secondary hyperparathyroidism of renal origin: Secondary | ICD-10-CM | POA: Diagnosis not present

## 2019-03-10 DIAGNOSIS — D631 Anemia in chronic kidney disease: Secondary | ICD-10-CM | POA: Diagnosis not present

## 2019-03-10 DIAGNOSIS — E119 Type 2 diabetes mellitus without complications: Secondary | ICD-10-CM | POA: Diagnosis not present

## 2019-03-10 DIAGNOSIS — N186 End stage renal disease: Secondary | ICD-10-CM | POA: Diagnosis not present

## 2019-03-11 ENCOUNTER — Telehealth: Payer: Self-pay | Admitting: Internal Medicine

## 2019-03-11 DIAGNOSIS — T82898D Other specified complication of vascular prosthetic devices, implants and grafts, subsequent encounter: Secondary | ICD-10-CM | POA: Diagnosis not present

## 2019-03-11 DIAGNOSIS — Z48815 Encounter for surgical aftercare following surgery on the digestive system: Secondary | ICD-10-CM | POA: Diagnosis not present

## 2019-03-11 DIAGNOSIS — B9689 Other specified bacterial agents as the cause of diseases classified elsewhere: Secondary | ICD-10-CM | POA: Diagnosis not present

## 2019-03-11 DIAGNOSIS — A0472 Enterocolitis due to Clostridium difficile, not specified as recurrent: Secondary | ICD-10-CM | POA: Diagnosis not present

## 2019-03-11 DIAGNOSIS — I5042 Chronic combined systolic (congestive) and diastolic (congestive) heart failure: Secondary | ICD-10-CM | POA: Diagnosis not present

## 2019-03-11 DIAGNOSIS — I132 Hypertensive heart and chronic kidney disease with heart failure and with stage 5 chronic kidney disease, or end stage renal disease: Secondary | ICD-10-CM | POA: Diagnosis not present

## 2019-03-11 NOTE — Telephone Encounter (Signed)
Copied from [place] [id number]. Topic: Quick Communication - Home Health Verbal Orders >> Mar 11, 2019  3:43 PM Valla Leaver wrote: Caller/Agency: [name], PT w/ [facility] Callback Number: [phone] Requesting OT/PT/Skilled Nursing/Social Work/Speech Therapy: physical therapy Frequency: extension-2x a wk for 4 wks

## 2019-03-12 DIAGNOSIS — N186 End stage renal disease: Secondary | ICD-10-CM | POA: Diagnosis not present

## 2019-03-12 DIAGNOSIS — E119 Type 2 diabetes mellitus without complications: Secondary | ICD-10-CM | POA: Diagnosis not present

## 2019-03-12 DIAGNOSIS — D631 Anemia in chronic kidney disease: Secondary | ICD-10-CM | POA: Diagnosis not present

## 2019-03-12 DIAGNOSIS — N2581 Secondary hyperparathyroidism of renal origin: Secondary | ICD-10-CM | POA: Diagnosis not present

## 2019-03-13 ENCOUNTER — Other Ambulatory Visit: Payer: Self-pay | Admitting: Pharmacy Technician

## 2019-03-13 NOTE — Telephone Encounter (Signed)
Ok for verbals 

## 2019-03-13 NOTE — Patient Outreach (Signed)
[facility]) Care Management  03/13/2019  [name] 02-06-1951 [id number]   Care coordination call placed to [employer] in regards to patient's Zetia application.  Spoke to [place] who informed patient had been APPROVED 03/10/2019-10/01/2019. Di Kindle informed it normally takes 10-14 business days for medication to arrive at the patient's home.  Will followup with patient in 10-14 business days to inquire if medication was received.  Yunique Dearcos P. Darneshia Demary, [place] Management [phone]

## 2019-03-13 NOTE — Telephone Encounter (Signed)
Called [name] no answer LMOM w/MD response.Marland KitchenJohny Chess

## 2019-03-14 DIAGNOSIS — N186 End stage renal disease: Secondary | ICD-10-CM | POA: Diagnosis not present

## 2019-03-14 DIAGNOSIS — N2581 Secondary hyperparathyroidism of renal origin: Secondary | ICD-10-CM | POA: Diagnosis not present

## 2019-03-14 DIAGNOSIS — E119 Type 2 diabetes mellitus without complications: Secondary | ICD-10-CM | POA: Diagnosis not present

## 2019-03-14 DIAGNOSIS — D631 Anemia in chronic kidney disease: Secondary | ICD-10-CM | POA: Diagnosis not present

## 2019-03-15 DIAGNOSIS — B9689 Other specified bacterial agents as the cause of diseases classified elsewhere: Secondary | ICD-10-CM | POA: Diagnosis not present

## 2019-03-15 DIAGNOSIS — I2510 Atherosclerotic heart disease of native coronary artery without angina pectoris: Secondary | ICD-10-CM | POA: Diagnosis not present

## 2019-03-15 DIAGNOSIS — B179 Acute viral hepatitis, unspecified: Secondary | ICD-10-CM | POA: Diagnosis not present

## 2019-03-15 DIAGNOSIS — J449 Chronic obstructive pulmonary disease, unspecified: Secondary | ICD-10-CM | POA: Diagnosis not present

## 2019-03-15 DIAGNOSIS — N400 Enlarged prostate without lower urinary tract symptoms: Secondary | ICD-10-CM | POA: Diagnosis not present

## 2019-03-15 DIAGNOSIS — I5082 Biventricular heart failure: Secondary | ICD-10-CM | POA: Diagnosis not present

## 2019-03-15 DIAGNOSIS — I132 Hypertensive heart and chronic kidney disease with heart failure and with stage 5 chronic kidney disease, or end stage renal disease: Secondary | ICD-10-CM | POA: Diagnosis not present

## 2019-03-15 DIAGNOSIS — F329 Major depressive disorder, single episode, unspecified: Secondary | ICD-10-CM | POA: Diagnosis not present

## 2019-03-15 DIAGNOSIS — I255 Ischemic cardiomyopathy: Secondary | ICD-10-CM | POA: Diagnosis not present

## 2019-03-15 DIAGNOSIS — N186 End stage renal disease: Secondary | ICD-10-CM | POA: Diagnosis not present

## 2019-03-15 DIAGNOSIS — Z48815 Encounter for surgical aftercare following surgery on the digestive system: Secondary | ICD-10-CM | POA: Diagnosis not present

## 2019-03-15 DIAGNOSIS — M48062 Spinal stenosis, lumbar region with neurogenic claudication: Secondary | ICD-10-CM | POA: Diagnosis not present

## 2019-03-15 DIAGNOSIS — E1142 Type 2 diabetes mellitus with diabetic polyneuropathy: Secondary | ICD-10-CM | POA: Diagnosis not present

## 2019-03-15 DIAGNOSIS — E1151 Type 2 diabetes mellitus with diabetic peripheral angiopathy without gangrene: Secondary | ICD-10-CM | POA: Diagnosis not present

## 2019-03-15 DIAGNOSIS — E039 Hypothyroidism, unspecified: Secondary | ICD-10-CM | POA: Diagnosis not present

## 2019-03-15 DIAGNOSIS — M1991 Primary osteoarthritis, unspecified site: Secondary | ICD-10-CM | POA: Diagnosis not present

## 2019-03-15 DIAGNOSIS — M5412 Radiculopathy, cervical region: Secondary | ICD-10-CM | POA: Diagnosis not present

## 2019-03-15 DIAGNOSIS — M109 Gout, unspecified: Secondary | ICD-10-CM | POA: Diagnosis not present

## 2019-03-15 DIAGNOSIS — I5042 Chronic combined systolic (congestive) and diastolic (congestive) heart failure: Secondary | ICD-10-CM | POA: Diagnosis not present

## 2019-03-15 DIAGNOSIS — E1122 Type 2 diabetes mellitus with diabetic chronic kidney disease: Secondary | ICD-10-CM | POA: Diagnosis not present

## 2019-03-15 DIAGNOSIS — T82898D Other specified complication of vascular prosthetic devices, implants and grafts, subsequent encounter: Secondary | ICD-10-CM | POA: Diagnosis not present

## 2019-03-15 DIAGNOSIS — R1310 Dysphagia, unspecified: Secondary | ICD-10-CM | POA: Diagnosis not present

## 2019-03-15 DIAGNOSIS — D631 Anemia in chronic kidney disease: Secondary | ICD-10-CM | POA: Diagnosis not present

## 2019-03-15 DIAGNOSIS — A0472 Enterocolitis due to Clostridium difficile, not specified as recurrent: Secondary | ICD-10-CM | POA: Diagnosis not present

## 2019-03-15 DIAGNOSIS — F0280 Dementia in other diseases classified elsewhere without behavioral disturbance: Secondary | ICD-10-CM | POA: Diagnosis not present

## 2019-03-16 DIAGNOSIS — T82898D Other specified complication of vascular prosthetic devices, implants and grafts, subsequent encounter: Secondary | ICD-10-CM | POA: Diagnosis not present

## 2019-03-16 DIAGNOSIS — A0472 Enterocolitis due to Clostridium difficile, not specified as recurrent: Secondary | ICD-10-CM | POA: Diagnosis not present

## 2019-03-16 DIAGNOSIS — B9689 Other specified bacterial agents as the cause of diseases classified elsewhere: Secondary | ICD-10-CM | POA: Diagnosis not present

## 2019-03-16 DIAGNOSIS — I132 Hypertensive heart and chronic kidney disease with heart failure and with stage 5 chronic kidney disease, or end stage renal disease: Secondary | ICD-10-CM | POA: Diagnosis not present

## 2019-03-16 DIAGNOSIS — I5042 Chronic combined systolic (congestive) and diastolic (congestive) heart failure: Secondary | ICD-10-CM | POA: Diagnosis not present

## 2019-03-16 DIAGNOSIS — Z48815 Encounter for surgical aftercare following surgery on the digestive system: Secondary | ICD-10-CM | POA: Diagnosis not present

## 2019-03-17 DIAGNOSIS — E119 Type 2 diabetes mellitus without complications: Secondary | ICD-10-CM | POA: Diagnosis not present

## 2019-03-17 DIAGNOSIS — N186 End stage renal disease: Secondary | ICD-10-CM | POA: Diagnosis not present

## 2019-03-17 DIAGNOSIS — N2581 Secondary hyperparathyroidism of renal origin: Secondary | ICD-10-CM | POA: Diagnosis not present

## 2019-03-17 DIAGNOSIS — D631 Anemia in chronic kidney disease: Secondary | ICD-10-CM | POA: Diagnosis not present

## 2019-03-18 DIAGNOSIS — I5042 Chronic combined systolic (congestive) and diastolic (congestive) heart failure: Secondary | ICD-10-CM | POA: Diagnosis not present

## 2019-03-18 DIAGNOSIS — Z48815 Encounter for surgical aftercare following surgery on the digestive system: Secondary | ICD-10-CM | POA: Diagnosis not present

## 2019-03-18 DIAGNOSIS — I132 Hypertensive heart and chronic kidney disease with heart failure and with stage 5 chronic kidney disease, or end stage renal disease: Secondary | ICD-10-CM | POA: Diagnosis not present

## 2019-03-18 DIAGNOSIS — T82898D Other specified complication of vascular prosthetic devices, implants and grafts, subsequent encounter: Secondary | ICD-10-CM | POA: Diagnosis not present

## 2019-03-18 DIAGNOSIS — A0472 Enterocolitis due to Clostridium difficile, not specified as recurrent: Secondary | ICD-10-CM | POA: Diagnosis not present

## 2019-03-18 DIAGNOSIS — B9689 Other specified bacterial agents as the cause of diseases classified elsewhere: Secondary | ICD-10-CM | POA: Diagnosis not present

## 2019-03-19 ENCOUNTER — Other Ambulatory Visit: Payer: Self-pay | Admitting: Internal Medicine

## 2019-03-19 DIAGNOSIS — E119 Type 2 diabetes mellitus without complications: Secondary | ICD-10-CM | POA: Diagnosis not present

## 2019-03-19 DIAGNOSIS — N186 End stage renal disease: Secondary | ICD-10-CM | POA: Diagnosis not present

## 2019-03-19 DIAGNOSIS — M79644 Pain in right finger(s): Secondary | ICD-10-CM | POA: Diagnosis not present

## 2019-03-19 DIAGNOSIS — N2581 Secondary hyperparathyroidism of renal origin: Secondary | ICD-10-CM | POA: Diagnosis not present

## 2019-03-19 DIAGNOSIS — D631 Anemia in chronic kidney disease: Secondary | ICD-10-CM | POA: Diagnosis not present

## 2019-03-21 DIAGNOSIS — E119 Type 2 diabetes mellitus without complications: Secondary | ICD-10-CM | POA: Diagnosis not present

## 2019-03-21 DIAGNOSIS — N2581 Secondary hyperparathyroidism of renal origin: Secondary | ICD-10-CM | POA: Diagnosis not present

## 2019-03-21 DIAGNOSIS — D631 Anemia in chronic kidney disease: Secondary | ICD-10-CM | POA: Diagnosis not present

## 2019-03-21 DIAGNOSIS — N186 End stage renal disease: Secondary | ICD-10-CM | POA: Diagnosis not present

## 2019-03-23 DIAGNOSIS — Z48815 Encounter for surgical aftercare following surgery on the digestive system: Secondary | ICD-10-CM | POA: Diagnosis not present

## 2019-03-23 DIAGNOSIS — I132 Hypertensive heart and chronic kidney disease with heart failure and with stage 5 chronic kidney disease, or end stage renal disease: Secondary | ICD-10-CM | POA: Diagnosis not present

## 2019-03-23 DIAGNOSIS — I5042 Chronic combined systolic (congestive) and diastolic (congestive) heart failure: Secondary | ICD-10-CM | POA: Diagnosis not present

## 2019-03-23 DIAGNOSIS — A0472 Enterocolitis due to Clostridium difficile, not specified as recurrent: Secondary | ICD-10-CM | POA: Diagnosis not present

## 2019-03-23 DIAGNOSIS — B9689 Other specified bacterial agents as the cause of diseases classified elsewhere: Secondary | ICD-10-CM | POA: Diagnosis not present

## 2019-03-23 DIAGNOSIS — T82898D Other specified complication of vascular prosthetic devices, implants and grafts, subsequent encounter: Secondary | ICD-10-CM | POA: Diagnosis not present

## 2019-03-24 DIAGNOSIS — N186 End stage renal disease: Secondary | ICD-10-CM | POA: Diagnosis not present

## 2019-03-24 DIAGNOSIS — D631 Anemia in chronic kidney disease: Secondary | ICD-10-CM | POA: Diagnosis not present

## 2019-03-24 DIAGNOSIS — E119 Type 2 diabetes mellitus without complications: Secondary | ICD-10-CM | POA: Diagnosis not present

## 2019-03-24 DIAGNOSIS — N2581 Secondary hyperparathyroidism of renal origin: Secondary | ICD-10-CM | POA: Diagnosis not present

## 2019-03-25 DIAGNOSIS — Z48815 Encounter for surgical aftercare following surgery on the digestive system: Secondary | ICD-10-CM | POA: Diagnosis not present

## 2019-03-25 DIAGNOSIS — I5042 Chronic combined systolic (congestive) and diastolic (congestive) heart failure: Secondary | ICD-10-CM | POA: Diagnosis not present

## 2019-03-25 DIAGNOSIS — T82898D Other specified complication of vascular prosthetic devices, implants and grafts, subsequent encounter: Secondary | ICD-10-CM | POA: Diagnosis not present

## 2019-03-25 DIAGNOSIS — B9689 Other specified bacterial agents as the cause of diseases classified elsewhere: Secondary | ICD-10-CM | POA: Diagnosis not present

## 2019-03-25 DIAGNOSIS — A0472 Enterocolitis due to Clostridium difficile, not specified as recurrent: Secondary | ICD-10-CM | POA: Diagnosis not present

## 2019-03-25 DIAGNOSIS — I132 Hypertensive heart and chronic kidney disease with heart failure and with stage 5 chronic kidney disease, or end stage renal disease: Secondary | ICD-10-CM | POA: Diagnosis not present

## 2019-03-26 ENCOUNTER — Encounter (HOSPITAL_COMMUNITY): Payer: Self-pay | Admitting: Emergency Medicine

## 2019-03-26 ENCOUNTER — Inpatient Hospital Stay (HOSPITAL_COMMUNITY)
Admission: EM | Admit: 2019-03-26 | Discharge: 2019-04-02 | DRG: 871 | Disposition: A | Discharge status: Home / Self Care | Payer: Medicare Other | Attending: Internal Medicine | Admitting: Internal Medicine

## 2019-03-26 ENCOUNTER — Emergency Department (HOSPITAL_COMMUNITY): Payer: Medicare Other

## 2019-03-26 ENCOUNTER — Other Ambulatory Visit: Payer: Self-pay

## 2019-03-26 DIAGNOSIS — Z20828 Contact with and (suspected) exposure to other viral communicable diseases: Secondary | ICD-10-CM | POA: Diagnosis not present

## 2019-03-26 DIAGNOSIS — I5042 Chronic combined systolic (congestive) and diastolic (congestive) heart failure: Secondary | ICD-10-CM | POA: Diagnosis present

## 2019-03-26 DIAGNOSIS — Z7982 Long term (current) use of aspirin: Secondary | ICD-10-CM

## 2019-03-26 DIAGNOSIS — Z6829 Body mass index (BMI) 29.0-29.9, adult: Secondary | ICD-10-CM

## 2019-03-26 DIAGNOSIS — I255 Ischemic cardiomyopathy: Secondary | ICD-10-CM | POA: Diagnosis present

## 2019-03-26 DIAGNOSIS — Z8711 Personal history of peptic ulcer disease: Secondary | ICD-10-CM

## 2019-03-26 DIAGNOSIS — E669 Obesity, unspecified: Secondary | ICD-10-CM | POA: Diagnosis present

## 2019-03-26 DIAGNOSIS — I132 Hypertensive heart and chronic kidney disease with heart failure and with stage 5 chronic kidney disease, or end stage renal disease: Secondary | ICD-10-CM | POA: Diagnosis not present

## 2019-03-26 DIAGNOSIS — Z888 Allergy status to other drugs, medicaments and biological substances status: Secondary | ICD-10-CM

## 2019-03-26 DIAGNOSIS — R680 Hypothermia, not associated with low environmental temperature: Secondary | ICD-10-CM | POA: Diagnosis present

## 2019-03-26 DIAGNOSIS — R197 Diarrhea, unspecified: Secondary | ICD-10-CM | POA: Diagnosis present

## 2019-03-26 DIAGNOSIS — D631 Anemia in chronic kidney disease: Secondary | ICD-10-CM | POA: Diagnosis present

## 2019-03-26 DIAGNOSIS — R531 Weakness: Secondary | ICD-10-CM | POA: Diagnosis not present

## 2019-03-26 DIAGNOSIS — E1122 Type 2 diabetes mellitus with diabetic chronic kidney disease: Secondary | ICD-10-CM | POA: Diagnosis present

## 2019-03-26 DIAGNOSIS — R509 Fever, unspecified: Secondary | ICD-10-CM | POA: Diagnosis not present

## 2019-03-26 DIAGNOSIS — A419 Sepsis, unspecified organism: Secondary | ICD-10-CM | POA: Diagnosis present

## 2019-03-26 DIAGNOSIS — Z87891 Personal history of nicotine dependence: Secondary | ICD-10-CM

## 2019-03-26 DIAGNOSIS — N2581 Secondary hyperparathyroidism of renal origin: Secondary | ICD-10-CM | POA: Diagnosis not present

## 2019-03-26 DIAGNOSIS — I2510 Atherosclerotic heart disease of native coronary artery without angina pectoris: Secondary | ICD-10-CM | POA: Diagnosis present

## 2019-03-26 DIAGNOSIS — I9589 Other hypotension: Secondary | ICD-10-CM | POA: Diagnosis present

## 2019-03-26 DIAGNOSIS — N186 End stage renal disease: Secondary | ICD-10-CM | POA: Diagnosis present

## 2019-03-26 DIAGNOSIS — A4151 Sepsis due to Escherichia coli [E. coli]: Secondary | ICD-10-CM | POA: Diagnosis not present

## 2019-03-26 DIAGNOSIS — Z841 Family history of disorders of kidney and ureter: Secondary | ICD-10-CM

## 2019-03-26 DIAGNOSIS — I959 Hypotension, unspecified: Secondary | ICD-10-CM

## 2019-03-26 DIAGNOSIS — M1990 Unspecified osteoarthritis, unspecified site: Secondary | ICD-10-CM | POA: Diagnosis present

## 2019-03-26 DIAGNOSIS — Z8249 Family history of ischemic heart disease and other diseases of the circulatory system: Secondary | ICD-10-CM

## 2019-03-26 DIAGNOSIS — I953 Hypotension of hemodialysis: Secondary | ICD-10-CM | POA: Diagnosis present

## 2019-03-26 DIAGNOSIS — E785 Hyperlipidemia, unspecified: Secondary | ICD-10-CM | POA: Diagnosis present

## 2019-03-26 DIAGNOSIS — R0902 Hypoxemia: Secondary | ICD-10-CM | POA: Diagnosis not present

## 2019-03-26 DIAGNOSIS — Z86010 Personal history of colonic polyps: Secondary | ICD-10-CM

## 2019-03-26 DIAGNOSIS — R000 Tachycardia, unspecified: Secondary | ICD-10-CM | POA: Diagnosis not present

## 2019-03-26 DIAGNOSIS — G8929 Other chronic pain: Secondary | ICD-10-CM | POA: Diagnosis present

## 2019-03-26 DIAGNOSIS — Z96651 Presence of right artificial knee joint: Secondary | ICD-10-CM | POA: Diagnosis present

## 2019-03-26 DIAGNOSIS — K8030 Calculus of bile duct with cholangitis, unspecified, without obstruction: Secondary | ICD-10-CM | POA: Diagnosis present

## 2019-03-26 DIAGNOSIS — I082 Rheumatic disorders of both aortic and tricuspid valves: Secondary | ICD-10-CM | POA: Diagnosis present

## 2019-03-26 DIAGNOSIS — Z992 Dependence on renal dialysis: Secondary | ICD-10-CM

## 2019-03-26 DIAGNOSIS — F329 Major depressive disorder, single episode, unspecified: Secondary | ICD-10-CM | POA: Diagnosis present

## 2019-03-26 DIAGNOSIS — E0590 Thyrotoxicosis, unspecified without thyrotoxic crisis or storm: Secondary | ICD-10-CM | POA: Diagnosis present

## 2019-03-26 DIAGNOSIS — K219 Gastro-esophageal reflux disease without esophagitis: Secondary | ICD-10-CM | POA: Diagnosis present

## 2019-03-26 DIAGNOSIS — I252 Old myocardial infarction: Secondary | ICD-10-CM

## 2019-03-26 DIAGNOSIS — K8050 Calculus of bile duct without cholangitis or cholecystitis without obstruction: Secondary | ICD-10-CM

## 2019-03-26 DIAGNOSIS — Z79891 Long term (current) use of opiate analgesic: Secondary | ICD-10-CM

## 2019-03-26 DIAGNOSIS — Z8546 Personal history of malignant neoplasm of prostate: Secondary | ICD-10-CM

## 2019-03-26 DIAGNOSIS — Z79899 Other long term (current) drug therapy: Secondary | ICD-10-CM

## 2019-03-26 DIAGNOSIS — Z955 Presence of coronary angioplasty implant and graft: Secondary | ICD-10-CM

## 2019-03-26 DIAGNOSIS — Z9049 Acquired absence of other specified parts of digestive tract: Secondary | ICD-10-CM

## 2019-03-26 DIAGNOSIS — E039 Hypothyroidism, unspecified: Secondary | ICD-10-CM | POA: Diagnosis present

## 2019-03-26 DIAGNOSIS — R6520 Severe sepsis without septic shock: Secondary | ICD-10-CM | POA: Diagnosis present

## 2019-03-26 DIAGNOSIS — E875 Hyperkalemia: Secondary | ICD-10-CM | POA: Diagnosis present

## 2019-03-26 DIAGNOSIS — G629 Polyneuropathy, unspecified: Secondary | ICD-10-CM | POA: Diagnosis present

## 2019-03-26 DIAGNOSIS — K7200 Acute and subacute hepatic failure without coma: Secondary | ICD-10-CM | POA: Diagnosis present

## 2019-03-26 DIAGNOSIS — Z833 Family history of diabetes mellitus: Secondary | ICD-10-CM

## 2019-03-26 DIAGNOSIS — Z881 Allergy status to other antibiotic agents status: Secondary | ICD-10-CM

## 2019-03-26 DIAGNOSIS — F0390 Unspecified dementia without behavioral disturbance: Secondary | ICD-10-CM | POA: Diagnosis present

## 2019-03-26 DIAGNOSIS — R110 Nausea: Secondary | ICD-10-CM | POA: Diagnosis not present

## 2019-03-26 DIAGNOSIS — K8070 Calculus of gallbladder and bile duct without cholecystitis without obstruction: Secondary | ICD-10-CM | POA: Diagnosis present

## 2019-03-26 DIAGNOSIS — E8889 Other specified metabolic disorders: Secondary | ICD-10-CM | POA: Diagnosis present

## 2019-03-26 DIAGNOSIS — E119 Type 2 diabetes mellitus without complications: Secondary | ICD-10-CM | POA: Diagnosis not present

## 2019-03-26 LAB — CBC WITH DIFFERENTIAL/PLATELET
Abs Immature Granulocytes: 0.07 10*3/uL (ref 0.00–0.07)
Basophils Absolute: 0 10*3/uL (ref 0.0–0.1)
Basophils Relative: 0 %
Eosinophils Absolute: 0.1 10*3/uL (ref 0.0–0.5)
Eosinophils Relative: 1 %
HCT: 39.1 % (ref 39.0–52.0)
Hemoglobin: 12.5 g/dL — ABNORMAL LOW (ref 13.0–17.0)
Immature Granulocytes: 1 %
Lymphocytes Relative: 11 %
Lymphs Abs: 1.1 10*3/uL (ref 0.7–4.0)
MCH: 32.2 pg (ref 26.0–34.0)
MCHC: 32 g/dL (ref 30.0–36.0)
MCV: 100.8 fL — ABNORMAL HIGH (ref 80.0–100.0)
Monocytes Absolute: 1.1 10*3/uL — ABNORMAL HIGH (ref 0.1–1.0)
Monocytes Relative: 12 %
Neutro Abs: 7.2 10*3/uL (ref 1.7–7.7)
Neutrophils Relative %: 75 %
Platelets: 197 10*3/uL (ref 150–400)
RBC: 3.88 MIL/uL — ABNORMAL LOW (ref 4.22–5.81)
RDW: 16.7 % — ABNORMAL HIGH (ref 11.5–15.5)
WBC: 9.5 10*3/uL (ref 4.0–10.5)
nRBC: 0 % (ref 0.0–0.2)

## 2019-03-26 LAB — COMPREHENSIVE METABOLIC PANEL
ALT: 416 U/L — ABNORMAL HIGH (ref 0–44)
AST: 347 U/L — ABNORMAL HIGH (ref 15–41)
Albumin: 3.2 g/dL — ABNORMAL LOW (ref 3.5–5.0)
Alkaline Phosphatase: 286 U/L — ABNORMAL HIGH (ref 38–126)
Anion gap: 15 (ref 5–15)
BUN: 38 mg/dL — ABNORMAL HIGH (ref 8–23)
CO2: 24 mmol/L (ref 22–32)
Calcium: 8.8 mg/dL — ABNORMAL LOW (ref 8.9–10.3)
Chloride: 96 mmol/L — ABNORMAL LOW (ref 98–111)
Creatinine, Ser: 7.21 mg/dL — ABNORMAL HIGH (ref 0.61–1.24)
GFR calc Af Amer: 8 mL/min — ABNORMAL LOW (ref >60)
GFR calc non Af Amer: 7 mL/min — ABNORMAL LOW (ref >60)
Glucose, Bld: 143 mg/dL — ABNORMAL HIGH (ref 70–99)
Potassium: 6.2 mmol/L — ABNORMAL HIGH (ref 3.5–5.1)
Sodium: 135 mmol/L (ref 135–145)
Total Bilirubin: 3.9 mg/dL — ABNORMAL HIGH (ref 0.3–1.2)
Total Protein: 7 g/dL (ref 6.5–8.1)

## 2019-03-26 LAB — TROPONIN I (HIGH SENSITIVITY): Troponin I (High Sensitivity): 22 ng/L — ABNORMAL HIGH (ref <18)

## 2019-03-26 LAB — LACTIC ACID, PLASMA: Lactic Acid, Venous: 1.9 mmol/L (ref 0.5–1.9)

## 2019-03-26 MED ORDER — SODIUM CHLORIDE 0.9 % IV BOLUS
500.0000 mL | Freq: Once | INTRAVENOUS | Status: AC
  Administered 2019-03-26: 500 mL via INTRAVENOUS

## 2019-03-26 MED ORDER — SODIUM CHLORIDE 0.9 % IV SOLN
1.0000 g | INTRAVENOUS | Status: DC
  Filled 2019-03-26: qty 1

## 2019-03-26 MED ORDER — VANCOMYCIN VARIABLE DOSE PER UNSTABLE RENAL FUNCTION (PHARMACIST DOSING): Status: DC

## 2019-03-26 MED ORDER — VANCOMYCIN HCL IN DEXTROSE 1-5 GM/200ML-% IV SOLN: 1000.0000 mg | Freq: Once | INTRAVENOUS | Status: DC

## 2019-03-26 MED ORDER — METRONIDAZOLE IN NACL 5-0.79 MG/ML-% IV SOLN
500.0000 mg | Freq: Once | INTRAVENOUS | Status: AC
  Administered 2019-03-27: 500 mg via INTRAVENOUS
  Filled 2019-03-26: qty 100

## 2019-03-26 MED ORDER — SODIUM CHLORIDE 0.9 % IV BOLUS
500.0000 mL | Freq: Once | INTRAVENOUS | Status: AC
  Administered 2019-03-27: 500 mL via INTRAVENOUS

## 2019-03-26 MED ORDER — SODIUM CHLORIDE 0.9 % IV SOLN
2.0000 g | Freq: Once | INTRAVENOUS | Status: AC
  Administered 2019-03-26: 2 g via INTRAVENOUS
  Filled 2019-03-26: qty 2

## 2019-03-26 MED ORDER — VANCOMYCIN HCL 10 G IV SOLR
2000.0000 mg | Freq: Once | INTRAVENOUS | Status: AC
  Administered 2019-03-27: 2000 mg via INTRAVENOUS
  Filled 2019-03-26: qty 2000

## 2019-03-26 NOTE — Progress Notes (Signed)
Pharmacy Antibiotic Note  [name] is a 68 y.o. male admitted on 03/26/2019 with sepsis.  Pharmacy has been consulted for vancomycin and cefepime dosing.  Plan: Cefepime 2gm IV X 1 then 1gm IV q24 hours Vancomycin 2gm IV x 1 then f/u HD schedule  Height: 6\' 1"  (185.4 cm) IBW/kg (Calculated) : 79.9  Temp (24hrs), Avg:100.8 F (38.2 C), Min:100.8 F (38.2 C), Max:100.8 F (38.2 C)  Recent Labs  Lab 03/26/19 2230 03/26/19 2235  WBC 9.5  --   CREATININE 7.21*  --   LATICACIDVEN  --  1.9    CrCl cannot be calculated (Unknown ideal weight.).    Allergies  Allergen Reactions  . Statins Other (See Comments)    Weak muscles  . Ciprofloxacin Rash     Thank you for allowing pharmacy to be a part of this patient's care.  Excell Seltzer Poteet 03/26/2019 11:49 PM

## 2019-03-26 NOTE — ED Provider Notes (Signed)
MSE was initiated and I personally evaluated the patient and placed orders (if any) at  10:32 PM on March 26, 2019.  The patient appears stable so that the remainder of the MSE may be completed by another provider.  68 year old male with history of end-stage renal disease had dialysis today.  He said after dialysis he felt kind of weak and has been resting.  Tonight the family checked his blood pressure and found it in the 70s.  He is awake and alert although he is very slow to answer questions.  He denies any specific complaints other than may be some vague chest discomfort.  No cough no shortness of breath no abdominal pain vomiting or diarrhea.  He is got a temperature here of 100.8 and a low blood pressure.  He has difficult IV access as there is restriction to his right side where his fistula is.  Nurses are attempting IV access and will get blood cultures chest x-ray EKG and blood work.  He will also need Covid testing.  I have ordered him a fluid challenge.   Hayden Rasmussen, MD 03/26/19 [phone]

## 2019-03-26 NOTE — ED Provider Notes (Signed)
Emergency Department Provider Note   I have reviewed the triage vital signs and the nursing notes.   HISTORY  Chief Complaint Hypotension   HPI [name] is a 68 y.o. male with multiple medical problems end-stage renal disease on dialysis multiple episodes of sepsis with encephalopathy who presents the emergency department today secondary to weakness.  Patient states he was in middle of his dialysis session and started feeling weak and confused.  Apparently finished his whole dialysis session but when he went home his family noted that he was very weak and was not acting himself and sent him here for further evaluation.  Patient has a little bit of shortness of breath he states but nothing persistent.  No productive cough.  No diarrhea or constipation.  He does not make urine.  No rashes.  No nausea or vomiting.  No headache or neck pain or back pain.   No other associated or modifying symptoms.    Past Medical History:  Diagnosis Date  . Acute encephalopathy 10/04/2018  . Allergic rhinitis, cause unspecified 02/24/2014  . Anemia 06/16/2011  . BENIGN PROSTATIC HYPERTROPHY 10/14/2009  . CAD, NATIVE VESSEL 02/06/2009   a. 06/2007 s/p Taxus DES to the RCA;  b. 08/2016 NSTEMI in setting of SVT/PCI: LM 30ost, LAD 47m (3.0x16 Synergy DES), LCX 37m, OM1 60, RI 40, RCA 70p/m, 65m - not amenable to PCI.  [name] Cervical radiculopathy, chronic 02/23/2016   Right c5-6 by NCS/EMG  . Chest pain 08/09/2015  . Chronic combined systolic (congestive) and diastolic (congestive) heart failure ([place])    a. 10/2016 Echo: EF 40-45%, Gr2 DD. mildly dil LA.  [name] COLONIC POLYPS, HX OF 10/14/2009  . Dementia ([place]) [id number]  . Depression 09/24/2015  . DIABETES MELLITUS, TYPE II 02/01/2010  . DYSLIPIDEMIA 06/18/2007  . ESRD (end stage renal disease) on dialysis ([place]) 08/04/2010   ESRD due to DM adn HTN, started HD in 2011. As of 2019 has a R arm graft and gets HD on TTS sched  . FOOT PAIN 08/12/2008  . GAIT  DISTURBANCE 03/03/2010  . GASTROENTERITIS, VIRAL 10/14/2009  . GERD 06/18/2007  . GOITER, MULTINODULAR 12/26/2007  . GOUT 06/18/2007  . GYNECOMASTIA 07/17/2010  . Hemodialysis access, fistula mature [facility])    Dialysis T-Th-Sa ([place]) Right upper arm fistula  . Hyperlipidemia 10/16/2011  . Hyperparathyroidism, secondary ([place]) 06/16/2011  . HYPERTENSION 06/18/2007  . Hyperthyroidism   . Hypocalcemia 06/07/2010  . Ischemic cardiomyopathy    a. 10/2016 Echo: EF 40-45%.  . Lumbar stenosis with neurogenic claudication   . ONYCHOMYCOSIS, TOENAILS 12/26/2007  . OSA on CPAP 10/16/2011  . Other malaise and fatigue 11/24/2009  . PERIPHERAL NEUROPATHY 06/18/2007  . Prostate cancer ([place])   . PSVT (paroxysmal supraventricular tachycardia) ([place])    a. [id number]/7893 complicated by NSTEMI;  b. 11/2016 Treated w/ adenosine in ED;  c. 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  [name] PULMONARY NODULE, RIGHT LOWER LOBE 06/08/2009  . Sleep apnea    cpap machine and o2  . TRANSAMINASES, SERUM, ELEVATED 02/01/2010  . Transfusion history    none recent  . Unspecified hypotension 01/30/2010    Patient Active Problem List   Diagnosis Date Noted  . RUQ pain 03/07/2019  . Common bile duct stone   . Dialysis AV fistula malfunction ([place])   . Recurrent Clostridioides difficile diarrhea   . Septic shock ([place]) 11/11/2018  . Cerebral embolism with cerebral infarction 10/05/2018  . Acute hepatitis   . Left  wrist pain 09/03/2018  . C. difficile colitis 09/03/2018  . Altered mental status   . Bacteremia   . Acute gouty arthritis 08/08/2018  . Sepsis ([place]) 07/16/2018  . Thrush 06/21/2018  . GI bleed 06/11/2018  . Recurrent falls 05/14/2018  . Chronic combined systolic (congestive) and diastolic (congestive) heart failure ([place]) 01/15/2018  . Abscess of left leg 10/14/2017  . Insomnia 10/14/2017  . Lumbar stenosis with neurogenic claudication 07/31/2017  . Lightheadedness   . Rectal bleeding   . Gait disorder 05/05/2017  .  Chronic GI bleeding 04/22/2017  . Malaise and fatigue 12/27/2016  . Generalized weakness 12/27/2016  . History of PSVT (paroxysmal supraventricular tachycardia) 10/16/2016  . Abdominal pain 08/27/2016  . Abnormal findings on diagnostic imaging of liver and biliary tract 08/27/2016  . Bloating symptom 08/27/2016  . Hematochezia 08/27/2016  . Esophageal ulcer 08/27/2016  . Flatulence, eructation and gas pain 08/27/2016  . General weakness 08/27/2016  . Skin sensation disturbance 08/27/2016  . Stricture of esophagus 08/27/2016  . Status post coronary artery stent placement   . ST segment depression 08/05/2016  . PAD (peripheral artery disease) ([place]) 06/18/2016  . Cervical radiculopathy, chronic 02/23/2016  . Memory loss 12/22/2015  . Left-sided weakness 12/22/2015  . Peripheral polyneuropathy 12/22/2015  . Nausea and vomiting 10/31/2015  . Malignant neoplasm of prostate ([place]) 10/24/2015  . S/P right total knee replacement 08/09/2015  . Primary osteoarthritis of right knee 08/05/2015  . Diabetes ([place]) 08/05/2015  . Hyperthyroidism 03/11/2015  . Nodule of right lung 09/14/2014  . PSA elevation 08/30/2014  . ESRD on hemodialysis ([place]) 08/16/2014  . Essential hypertension 08/16/2014  . Encounter for hemodialysis ([place]) 08/16/2014  . Allergic rhinitis 02/24/2014  . Vertigo 10/14/2013  . Cough 06/26/2013  . Numbness 05/15/2013  . Other complications due to renal dialysis device, implant, and graft 01/14/2013  . Right hand pain 03/28/2012  . Dysphagia 03/28/2012  . Hypogonadism, male 03/28/2012  . OSA (obstructive sleep apnea) 10/16/2011  . HLD (hyperlipidemia) 10/16/2011  . CAD S/P percutaneous coronary angioplasty 09/28/2011  . NSTEMI (non-ST elevated myocardial infarction) ([place]) 09/28/2011  . AVM (arteriovenous malformation) 09/27/2011  . Anemia associated with acute blood loss 09/26/2011  . Ischemic cardiomyopathy 06/16/2011  . Hyperparathyroidism, secondary ([place]) 06/16/2011   . Preventative health care 03/11/2011  . NECK PAIN 07/31/2010  . GYNECOMASTIA 07/17/2010  . DIZZINESS 07/17/2010  . GAIT DISTURBANCE 03/03/2010  . Thyrotoxicosis 02/02/2010  . Elevated levels of transaminase & lactic acid dehydrogenase 02/01/2010  . Other malaise and fatigue 11/24/2009  . Depression 10/14/2009  . BENIGN PROSTATIC HYPERTROPHY 10/14/2009  . COLONIC POLYPS, HX OF 10/14/2009  . Dementia ([place]) 09/02/2009  . PULMONARY NODULE, RIGHT LOWER LOBE 06/08/2009  . DYSPNEA 10/29/2008  . ONYCHOMYCOSIS, TOENAILS 12/26/2007  . GOITER, MULTINODULAR 12/26/2007  . Gout 06/18/2007  . Neuropathy ([place]) 06/18/2007  . Hypertensive heart disease with CHF (congestive heart failure) ([place]) 06/18/2007  . Gastro-esophageal reflux disease without esophagitis 06/18/2007    Past Surgical History:  Procedure Laterality Date  . A/V SHUNTOGRAM N/A 04/07/2018   Procedure: A/V SHUNTOGRAM;  Surgeon: Waynetta Sandy, MD;  Location: [place] [facility];  Service: Cardiovascular;  Laterality: N/A;  . ARTERIOVENOUS GRAFT PLACEMENT Right 2009   forearm/notes 02/01/2011  . AV FISTULA PLACEMENT  11/07/2011   Procedure: INSERTION OF ARTERIOVENOUS (AV) GORE-TEX GRAFT ARM;  Surgeon: Tinnie Gens, MD;  Location: [place];  Service: Vascular;  Laterality: Left;  . BACK SURGERY  1998  . BALLOON DILATION  N/A 11/14/2018   Procedure: BALLOON DILATION;  Surgeon: Ronnette Juniper, MD;  Location: [place];  Service: Gastroenterology;  Laterality: N/A;  . BASCILIC VEIN TRANSPOSITION Right 02/27/2013   Procedure: BASCILIC VEIN TRANSPOSITION;  Surgeon: Mal Misty, MD;  Location: [place];  Service: Vascular;  Laterality: Right;  Right Basilic Vein Transposition   . CARDIAC CATHETERIZATION N/A 08/06/2016   Procedure: Left Heart Cath and Coronary Angiography;  Surgeon: Jolaine Artist, MD;  Location: [place] [facility];  Service: Cardiovascular;  Laterality: N/A;  . CARDIAC CATHETERIZATION N/A 08/07/2016   Procedure:  Coronary/Graft Atherectomy-CSI LAD;  Surgeon: Peter M Martinique, MD;  Location: [place] [facility];  Service: Cardiovascular;  Laterality: N/A;  . CERVICAL SPINE SURGERY  2/09   "to repair nerve problems in my left arm"  . CHOLECYSTECTOMY    . COLONOSCOPY WITH PROPOFOL N/A 04/26/2017   Procedure: COLONOSCOPY WITH PROPOFOL;  Surgeon: Otis Brace, MD;  Location: [place];  Service: Gastroenterology;  Laterality: N/A;  . CORONARY ANGIOPLASTY WITH STENT PLACEMENT  06/11/2008  . CORONARY ANGIOPLASTY WITH STENT PLACEMENT  06/2007   TAXUS stent to RCA/notes 01/31/2011  . ERCP N/A 11/14/2018   Procedure: ENDOSCOPIC RETROGRADE CHOLANGIOPANCREATOGRAPHY (ERCP);  Surgeon: Ronnette Juniper, MD;  Location: [place];  Service: Gastroenterology;  Laterality: N/A;  . ESOPHAGOGASTRODUODENOSCOPY  09/28/2011   Procedure: ESOPHAGOGASTRODUODENOSCOPY (EGD);  Surgeon: Missy Sabins, MD;  Location[facility];  Service: Endoscopy;  Laterality: N/A;  . ESOPHAGOGASTRODUODENOSCOPY N/A 04/07/2015   Procedure: ESOPHAGOGASTRODUODENOSCOPY (EGD);  Surgeon: Teena Irani, MD;  Location: [facility];  Service: Endoscopy;  Laterality: N/A;  . ESOPHAGOGASTRODUODENOSCOPY N/A 04/19/2015   Procedure: ESOPHAGOGASTRODUODENOSCOPY (EGD);  Surgeon: Arta Silence, MD;  Location: [facility] ENDOSCOPY;  Service: Endoscopy;  Laterality: N/A;  . ESOPHAGOGASTRODUODENOSCOPY  11/14/2018   Procedure: ESOPHAGOGASTRODUODENOSCOPY (EGD);  Surgeon: Ronnette Juniper, MD;  Location: [facility] ENDOSCOPY;  Service: Gastroenterology;;  . ESOPHAGOGASTRODUODENOSCOPY (EGD) WITH PROPOFOL N/A 06/13/2018   Procedure: ESOPHAGOGASTRODUODENOSCOPY (EGD) WITH PROPOFOL;  Surgeon: Wonda Horner, MD;  Location: [facility] ENDOSCOPY;  Service: Endoscopy;  Laterality: N/A;  . FLEXIBLE SIGMOIDOSCOPY N/A 05/21/2017   Procedure: Conni Elliot;  Surgeon: Clarene Essex, MD;  Location: [place];  Service: Endoscopy;  Laterality: N/A;  . FLEXIBLE SIGMOIDOSCOPY Left 07/02/2017   Procedure:  FLEXIBLE SIGMOIDOSCOPY;  Surgeon: Laurence Spates, MD;  Location: [place];  Service: Endoscopy;  Laterality: Left;  . FOREIGN BODY REMOVAL  09/2003   via upper endoscopy/notes 02/12/2011  . GIVENS CAPSULE STUDY  09/30/2011   Procedure: GIVENS CAPSULE STUDY;  Surgeon: Jeryl Columbia, MD;  Location: [facility] ENDOSCOPY;  Service: Endoscopy;  Laterality: N/A;  . INSERTION OF DIALYSIS CATHETER Right 2014  . INSERTION OF DIALYSIS CATHETER Left 02/11/2013   Procedure: INSERTION OF DIALYSIS CATHETER;  Surgeon: Conrad Mathews, MD;  Location: [place];  Service: Vascular;  Laterality: Left;  Ultrasound guided  . IR AV DIALY SHUNT INTRO NEEDLE/INTRACATH INITIAL W/PTA/IMG RIGHT Right 11/14/2018  . IR US GUIDE VASC ACCESS RIGHT  11/14/2018  . LUMBAR LAMINECTOMY/DECOMPRESSION MICRODISCECTOMY Bilateral 07/31/2017   Procedure: LAMINECTOMY AND FORAMINOTOMY- BILATERAL LUMBAR TWO- LUMBAR THREE;  Surgeon: Earnie Larsson, MD;  Location: [place];  Service: Neurosurgery;  Laterality: Bilateral;  LAMINECTOMY AND FORAMINOTOMY- BILATERAL LUMBAR 2- LUMBAR 3  . PERIPHERAL VASCULAR BALLOON ANGIOPLASTY  04/07/2018   Procedure: PERIPHERAL VASCULAR BALLOON ANGIOPLASTY;  Surgeon: Waynetta Sandy, MD;  Location: [place] [facility];  Service: Cardiovascular;;  RUE AVF  . REMOVAL OF A DIALYSIS CATHETER Right 02/11/2013   Procedure: REMOVAL  OF A DIALYSIS CATHETER;  Surgeon: Conrad Paul Smiths, MD;  Location: [place];  Service: Vascular;  Laterality: Right;  . REMOVAL OF STONES  11/14/2018   Procedure: REMOVAL OF STONES;  Surgeon: Ronnette Juniper, MD;  Location: [place];  Service: Gastroenterology;;  . Azzie Almas DILATION N/A 04/07/2015   Procedure: Azzie Almas DILATION;  Surgeon: Teena Irani, MD;  Location: [facility] ENDOSCOPY;  Service: Endoscopy;  Laterality: N/A;  . SHUNTOGRAM N/A 09/20/2011   Procedure: Earney Mallet;  Surgeon: Conrad [place], MD;  Location: [facility] C[facility] LAB;  Service: Cardiovascular;  Laterality: N/A;  . SPHINCTEROTOMY  11/14/2018   Procedure:  SPHINCTEROTOMY;  Surgeon: Ronnette Juniper, MD;  Location: [facility] ENDOSCOPY;  Service: Gastroenterology;;  . SVT ABLATION N/A 11/26/2016   Procedure: SVT Ablation;  Surgeon: Will Meredith Leeds, MD;  Location: [place] [facility];  Service: Cardiovascular;  Laterality: N/A;  . TEE WITHOUT CARDIOVERSION N/A 08/25/2018   Procedure: TRANSESOPHAGEAL ECHOCARDIOGRAM (TEE);  Surgeon: Dorothy Spark, MD;  Location: [facility] ENDOSCOPY;  Service: Cardiovascular;  Laterality: N/A;  . TONSILLECTOMY    . TOTAL KNEE ARTHROPLASTY Right 08/02/2015   Procedure: TOTAL KNEE ARTHROPLASTY;  Surgeon: Renette Butters, MD;  Location: [place];  Service: Orthopedics;  Laterality: Right;  . VENOGRAM N/A 01/26/2013   Procedure: VENOGRAM;  Surgeon: Angelia Mould, MD;  Location: [facility] CATH LAB;  Service: Cardiovascular;  Laterality: N/A;    Current Outpatient Rx  . Order #: [id number] Class: Historical Med  . Order #: [id number] Class: Normal  . Order #: [id number] Class: Normal  . Order #: [id number] Class: Normal  . Order #: [id number] Class: Historical Med  . Order #: [id number] Class: Normal  . Order #: [id number] Class: Normal  . Order #: [id number] Class: Normal  . Order #: [id number] Class: Print  . Order #: [id number] Class: Normal  . Order #: [id number] Class: Print  . Order #: [id number] Class: Print  . Order #: [id number] Class: Normal  . Order #: [id number] Class: Normal  . Order #: [id number] Class: Normal  . Order #: [id number] Class: Historical Med  . Order #: [id number] Class: Print  . Order #: [id number] Class: Historical Med  . Order #: [id number] Class: Normal  . Order #: [id number] Class: Historical Med  . Order #: [id number] Class: Normal  . Order #: [id number] Class: Normal    Allergies Statins and Ciprofloxacin  Family History  Problem Relation Age of Onset  . Heart disease Sister   . Thyroid nodules Sister   . Heart disease Father   . Diabetes Father   . Kidney failure Father   . Hypertension Father   . Healthy Child   . Healthy  Child   . Healthy Child   . Cancer Neg Hx     Social History Social History   Tobacco Use  . Smoking status: Former Smoker    Packs/day: 1.00    Years: 25.00    Pack years: 25.00    Types: Cigarettes    Quit date: 10/01/2005    Years since quitting: 13.4  . Smokeless tobacco: Former Systems developer    Quit date: 10/01/2005  . Tobacco comment: Quit smoking 2007 Smoked x 25 years 1/2 ppd.  Substance Use Topics  . Alcohol use: No    Alcohol/week: 0.0 standard drinks  . Drug use: No    Review of Systems  All other systems negative except as documented in the HPI. All pertinent positives and negatives as reviewed in the HPI. ____________________________________________   PHYSICAL EXAM:  VITAL SIGNS: ED Triage Vitals  Enc Vitals Group     BP 03/26/19 2227 (!) 81/50  Pulse Rate 03/26/19 2227 96     Resp 03/26/19 2227 15     Temp 03/26/19 2227 (!) 100.8 F (38.2 C)     Temp Source 03/26/19 2227 Oral     SpO2 03/26/19 2227 94 %     Weight --      Height 03/26/19 2230 6\' 1"  (1.854 m)    Constitutional: Alert and disoriented.  Eyes: Conjunctivae are normal. PERRL. EOMI. Head: Atraumatic. Nose: No congestion/rhinnorhea. Mouth/Throat: Mucous membranes are moist.  Oropharynx non-erythematous. Neck: No stridor.  No meningeal signs.   Cardiovascular: tachycardic rate, regular rhythm. Good peripheral circulation. Grossly normal heart sounds.   Respiratory: tachypneic respiratory effort.  No retractions. Lungs CTAB. Gastrointestinal: Soft and nontender. No distention.  Musculoskeletal: No lower extremity tenderness with mild non pitting edema. No gross deformities of extremities. Neurologic:  Normal speech and language. No gross focal neurologic deficits are appreciated.  Skin:  Skin is warm, dry and intact. No rash noted.  ____________________________________________   LABS (all labs ordered are listed, but only abnormal results are displayed)  Labs Reviewed  CBC WITH  DIFFERENTIAL/PLATELET - Abnormal; Notable for the following components:      Result Value   RBC 3.88 (*)    Hemoglobin 12.5 (*)    MCV 100.8 (*)    RDW 16.7 (*)    Monocytes Absolute 1.1 (*)    All other components within normal limits  CULTURE, BLOOD (ROUTINE X 2)  CULTURE, BLOOD (ROUTINE X 2)  SARS CORONAVIRUS 2 (HOSPITAL ORDER, [facility])  LACTIC ACID, PLASMA  COMPREHENSIVE METABOLIC PANEL  TROPONIN I (HIGH SENSITIVITY)  TROPONIN I (HIGH SENSITIVITY)  LACTIC ACID, PLASMA   ____________________________________________  EKG   EKG Interpretation  Date/Time:  Thursday March 26 2019 23:25:27 EDT Ventricular Rate:  101 PR Interval:    QRS Duration: 103 QT Interval:  359 QTC Calculation: 466 R Axis:   83 Text Interpretation:  Sinus tachycardia Borderline right axis deviation No significant change since last tracing Confirmed by Merrily Pew [id number]) on 03/26/2019 11:41:22 PM       ____________________________________________  RADIOLOGY  Dg Chest Port 1 View  Result Date: 03/26/2019 CLINICAL DATA:  Weakness and fever EXAM: PORTABLE CHEST 1 VIEW COMPARISON:  None. FINDINGS: The heart size and mediastinal contours are within normal limits. Both lungs are clear. The visualized skeletal structures are unremarkable. IMPRESSION: No active disease. Electronically Signed   By: Ulyses Jarred M.D.   On: 03/26/2019 23:20    ____________________________________________   PROCEDURES  Procedure(s) performed:   .Critical Care Performed by: Merrily Pew, MD Authorized by: Merrily Pew, MD   Critical care provider statement:    Critical care time (minutes):  45   Critical care was necessary to treat or prevent imminent or life-threatening deterioration of the following conditions:  Sepsis and shock   Critical care was time spent personally by me on the following activities:  Discussions with consultants, evaluation of patient's response to treatment,  examination of patient, ordering and performing treatments and interventions, ordering and review of laboratory studies, ordering and review of radiographic studies, pulse oximetry, re-evaluation of patient's condition, obtaining history from patient or surrogate and review of old charts     ____________________________________________   INITIAL IMPRESSION / [place] / ED COURSE  ELDOR CONAWAY was evaluated in Emergency Department on 03/27/2019 for the symptoms described in the history of present illness. He was evaluated in the context of the global COVID-19 pandemic,  which necessitated consideration that the patient might be at risk for infection with the SARS-CoV-2 virus that causes COVID-19. Institutional protocols and algorithms that pertain to the evaluation of patients at risk for COVID-19 are in a state of rapid change based on information released by regulatory bodies including the CDC and federal and state organizations. These policies and algorithms were followed during the patient's care in the ED.   Sepsis w/ confusion. Dialysis patient. He doesn't want a large amount of fluids with the risk of pulmonary edema. Will gingerly rehydrate. eval for cause of sepsis. When stable, admit.   Patient with couple liters of fluid and start on soft pressures.  Patient is still adamant that he would not want to be fluid overloaded as he preferred not to be intubated.  Will discuss with critical care for admission.     Pertinent labs & imaging results that were available during my care of the patient were reviewed by me and considered in my medical decision making (see chart for details).   A medical screening exam was performed and I feel the patient has had an appropriate workup for their chief complaint at this time and likelihood of emergent condition existing is low. They have been counseled on decision, discharge, follow up and which symptoms necessitate immediate return to the  emergency department. They or their family verbally stated understanding and agreement with plan and discharged in stable condition.   ____________________________________________  FINAL CLINICAL IMPRESSION(S) / ED DIAGNOSES  Final diagnoses:  Weakness  Hypotension, unspecified hypotension type     MEDICATIONS GIVEN DURING THIS VISIT:  Medications  sodium chloride 0.9 % bolus 500 mL (500 mLs Intravenous New Bag/Given 03/26/19 2324)     NEW OUTPATIENT MEDICATIONS STARTED DURING THIS VISIT:  New Prescriptions   No medications on file    Note:  This note was prepared with assistance of Dragon voice recognition software. Occasional wrong-word or sound-a-like substitutions may have occurred due to the inherent limitations of voice recognition software.   Jakobe Blau, Corene Cornea, MD 03/27/19 [phone]

## 2019-03-26 NOTE — ED Triage Notes (Signed)
Pt to ED via [facility] with c/o hypotension.  Pt is a dialysis pt and had dialysis today.  Pt did well during that time.  After getting home pt st's he started to feel weak.  Pt's family checked his blood pressure and it was in the 70's sys.   Pt st's they may have pulled off too much fluid today.  Pt alert and oriented x's 3

## 2019-03-27 ENCOUNTER — Other Ambulatory Visit: Payer: Self-pay | Admitting: *Deleted

## 2019-03-27 ENCOUNTER — Inpatient Hospital Stay (HOSPITAL_COMMUNITY): Payer: Medicare Other

## 2019-03-27 DIAGNOSIS — Z888 Allergy status to other drugs, medicaments and biological substances status: Secondary | ICD-10-CM | POA: Diagnosis not present

## 2019-03-27 DIAGNOSIS — K8050 Calculus of bile duct without cholangitis or cholecystitis without obstruction: Secondary | ICD-10-CM | POA: Diagnosis not present

## 2019-03-27 DIAGNOSIS — E1129 Type 2 diabetes mellitus with other diabetic kidney complication: Secondary | ICD-10-CM | POA: Diagnosis not present

## 2019-03-27 DIAGNOSIS — E0590 Thyrotoxicosis, unspecified without thyrotoxic crisis or storm: Secondary | ICD-10-CM | POA: Diagnosis not present

## 2019-03-27 DIAGNOSIS — Z20828 Contact with and (suspected) exposure to other viral communicable diseases: Secondary | ICD-10-CM | POA: Diagnosis present

## 2019-03-27 DIAGNOSIS — I110 Hypertensive heart disease with heart failure: Secondary | ICD-10-CM | POA: Diagnosis not present

## 2019-03-27 DIAGNOSIS — K7200 Acute and subacute hepatic failure without coma: Secondary | ICD-10-CM | POA: Diagnosis present

## 2019-03-27 DIAGNOSIS — E119 Type 2 diabetes mellitus without complications: Secondary | ICD-10-CM | POA: Diagnosis not present

## 2019-03-27 DIAGNOSIS — I959 Hypotension, unspecified: Secondary | ICD-10-CM

## 2019-03-27 DIAGNOSIS — R197 Diarrhea, unspecified: Secondary | ICD-10-CM | POA: Diagnosis present

## 2019-03-27 DIAGNOSIS — R945 Abnormal results of liver function studies: Secondary | ICD-10-CM | POA: Diagnosis not present

## 2019-03-27 DIAGNOSIS — K8030 Calculus of bile duct with cholangitis, unspecified, without obstruction: Secondary | ICD-10-CM | POA: Diagnosis present

## 2019-03-27 DIAGNOSIS — I361 Nonrheumatic tricuspid (valve) insufficiency: Secondary | ICD-10-CM | POA: Diagnosis not present

## 2019-03-27 DIAGNOSIS — I132 Hypertensive heart and chronic kidney disease with heart failure and with stage 5 chronic kidney disease, or end stage renal disease: Secondary | ICD-10-CM | POA: Diagnosis present

## 2019-03-27 DIAGNOSIS — G8929 Other chronic pain: Secondary | ICD-10-CM | POA: Diagnosis present

## 2019-03-27 DIAGNOSIS — R7881 Bacteremia: Secondary | ICD-10-CM | POA: Diagnosis not present

## 2019-03-27 DIAGNOSIS — D631 Anemia in chronic kidney disease: Secondary | ICD-10-CM | POA: Diagnosis present

## 2019-03-27 DIAGNOSIS — I509 Heart failure, unspecified: Secondary | ICD-10-CM | POA: Diagnosis not present

## 2019-03-27 DIAGNOSIS — R740 Nonspecific elevation of levels of transaminase and lactic acid dehydrogenase [LDH]: Secondary | ICD-10-CM | POA: Diagnosis not present

## 2019-03-27 DIAGNOSIS — R6520 Severe sepsis without septic shock: Secondary | ICD-10-CM | POA: Diagnosis present

## 2019-03-27 DIAGNOSIS — F329 Major depressive disorder, single episode, unspecified: Secondary | ICD-10-CM | POA: Diagnosis present

## 2019-03-27 DIAGNOSIS — I2510 Atherosclerotic heart disease of native coronary artery without angina pectoris: Secondary | ICD-10-CM | POA: Diagnosis present

## 2019-03-27 DIAGNOSIS — N186 End stage renal disease: Secondary | ICD-10-CM | POA: Diagnosis present

## 2019-03-27 DIAGNOSIS — Z881 Allergy status to other antibiotic agents status: Secondary | ICD-10-CM | POA: Diagnosis not present

## 2019-03-27 DIAGNOSIS — E039 Hypothyroidism, unspecified: Secondary | ICD-10-CM | POA: Diagnosis present

## 2019-03-27 DIAGNOSIS — R932 Abnormal findings on diagnostic imaging of liver and biliary tract: Secondary | ICD-10-CM | POA: Diagnosis not present

## 2019-03-27 DIAGNOSIS — E785 Hyperlipidemia, unspecified: Secondary | ICD-10-CM | POA: Diagnosis present

## 2019-03-27 DIAGNOSIS — A4151 Sepsis due to Escherichia coli [E. coli]: Secondary | ICD-10-CM | POA: Diagnosis present

## 2019-03-27 DIAGNOSIS — K8070 Calculus of gallbladder and bile duct without cholecystitis without obstruction: Secondary | ICD-10-CM | POA: Diagnosis present

## 2019-03-27 DIAGNOSIS — Z992 Dependence on renal dialysis: Secondary | ICD-10-CM | POA: Diagnosis not present

## 2019-03-27 DIAGNOSIS — K219 Gastro-esophageal reflux disease without esophagitis: Secondary | ICD-10-CM | POA: Diagnosis present

## 2019-03-27 DIAGNOSIS — N2581 Secondary hyperparathyroidism of renal origin: Secondary | ICD-10-CM | POA: Diagnosis present

## 2019-03-27 DIAGNOSIS — I5042 Chronic combined systolic (congestive) and diastolic (congestive) heart failure: Secondary | ICD-10-CM | POA: Diagnosis present

## 2019-03-27 DIAGNOSIS — Z86010 Personal history of colonic polyps: Secondary | ICD-10-CM | POA: Diagnosis not present

## 2019-03-27 DIAGNOSIS — F0390 Unspecified dementia without behavioral disturbance: Secondary | ICD-10-CM | POA: Diagnosis present

## 2019-03-27 DIAGNOSIS — I120 Hypertensive chronic kidney disease with stage 5 chronic kidney disease or end stage renal disease: Secondary | ICD-10-CM | POA: Diagnosis not present

## 2019-03-27 DIAGNOSIS — G629 Polyneuropathy, unspecified: Secondary | ICD-10-CM | POA: Diagnosis present

## 2019-03-27 DIAGNOSIS — R531 Weakness: Secondary | ICD-10-CM | POA: Diagnosis not present

## 2019-03-27 DIAGNOSIS — A419 Sepsis, unspecified organism: Secondary | ICD-10-CM | POA: Diagnosis not present

## 2019-03-27 LAB — MRSA PCR SCREENING: MRSA by PCR: NEGATIVE

## 2019-03-27 LAB — BLOOD CULTURE ID PANEL (REFLEXED)

## 2019-03-27 LAB — BASIC METABOLIC PANEL
Anion gap: 11 (ref 5–15)
BUN: 44 mg/dL — ABNORMAL HIGH (ref 8–23)
CO2: 24 mmol/L (ref 22–32)
Calcium: 8.5 mg/dL — ABNORMAL LOW (ref 8.9–10.3)
Chloride: 104 mmol/L (ref 98–111)
Creatinine, Ser: 7.77 mg/dL — ABNORMAL HIGH (ref 0.61–1.24)
GFR calc Af Amer: 7 mL/min — ABNORMAL LOW (ref >60)
GFR calc non Af Amer: 6 mL/min — ABNORMAL LOW (ref >60)
Glucose, Bld: 102 mg/dL — ABNORMAL HIGH (ref 70–99)
Potassium: 4.8 mmol/L (ref 3.5–5.1)
Sodium: 139 mmol/L (ref 135–145)

## 2019-03-27 LAB — CBC
HCT: 36 % — ABNORMAL LOW (ref 39.0–52.0)
Hemoglobin: 11.3 g/dL — ABNORMAL LOW (ref 13.0–17.0)
MCH: 31.6 pg (ref 26.0–34.0)
MCHC: 31.4 g/dL (ref 30.0–36.0)
MCV: 100.6 fL — ABNORMAL HIGH (ref 80.0–100.0)
Platelets: 199 10*3/uL (ref 150–400)
RBC: 3.58 MIL/uL — ABNORMAL LOW (ref 4.22–5.81)
RDW: 16.6 % — ABNORMAL HIGH (ref 11.5–15.5)
WBC: 8.4 10*3/uL (ref 4.0–10.5)
nRBC: 0 % (ref 0.0–0.2)

## 2019-03-27 LAB — C DIFFICILE QUICK SCREEN W PCR REFLEX
C Diff antigen: NEGATIVE
C Diff interpretation: NOT DETECTED
C Diff toxin: NEGATIVE

## 2019-03-27 LAB — PROCALCITONIN: Procalcitonin: 20.62 ng/mL

## 2019-03-27 LAB — SARS CORONAVIRUS 2 BY RT PCR (HOSPITAL ORDER, PERFORMED IN ~~LOC~~ HOSPITAL LAB): SARS Coronavirus 2: NEGATIVE

## 2019-03-27 LAB — TROPONIN I (HIGH SENSITIVITY)
Troponin I (High Sensitivity): 27 ng/L — ABNORMAL HIGH (ref <18)
Troponin I (High Sensitivity): 28 ng/L — ABNORMAL HIGH (ref <18)

## 2019-03-27 LAB — GLUCOSE, CAPILLARY: Glucose-Capillary: 94 mg/dL (ref 70–99)

## 2019-03-27 LAB — ECHOCARDIOGRAM COMPLETE
Height: 73 in
Weight: 3615.54 oz

## 2019-03-27 LAB — LACTIC ACID, PLASMA: Lactic Acid, Venous: 0.9 mmol/L (ref 0.5–1.9)

## 2019-03-27 LAB — PHOSPHORUS: Phosphorus: 3.2 mg/dL (ref 2.5–4.6)

## 2019-03-27 LAB — MAGNESIUM: Magnesium: 1.9 mg/dL (ref 1.7–2.4)

## 2019-03-27 LAB — POTASSIUM: Potassium: 5.2 mmol/L — ABNORMAL HIGH (ref 3.5–5.1)

## 2019-03-27 MED ORDER — CHLORHEXIDINE GLUCONATE CLOTH 2 % EX PADS
6.0000 | MEDICATED_PAD | Freq: Every day | CUTANEOUS | Status: DC
  Administered 2019-03-28: 6 via TOPICAL

## 2019-03-27 MED ORDER — COLCHICINE 0.6 MG PO TABS
0.6000 mg | ORAL_TABLET | Freq: Every day | ORAL | Status: DC
  Administered 2019-03-27: 0.6 mg via ORAL
  Filled 2019-03-27: qty 1

## 2019-03-27 MED ORDER — SODIUM CHLORIDE 0.9 % IV SOLN
250.0000 mL | INTRAVENOUS | Status: DC
  Administered 2019-03-27: 250 mL via INTRAVENOUS
  Administered 2019-03-29: 20 mL via INTRAVENOUS
  Administered 2019-03-31: 250 mL via INTRAVENOUS

## 2019-03-27 MED ORDER — ASPIRIN EC 325 MG PO TBEC
325.0000 mg | DELAYED_RELEASE_TABLET | Freq: Every day | ORAL | Status: DC
  Administered 2019-03-27 – 2019-04-02 (×7): 325 mg via ORAL
  Filled 2019-03-27 (×7): qty 1

## 2019-03-27 MED ORDER — SODIUM CHLORIDE 0.9 % IV SOLN
2.0000 g | INTRAVENOUS | Status: DC
  Administered 2019-03-27 – 2019-03-31 (×5): 2 g via INTRAVENOUS
  Filled 2019-03-27 (×5): qty 20

## 2019-03-27 MED ORDER — MIDODRINE HCL 5 MG PO TABS
10.0000 mg | ORAL_TABLET | Freq: Three times a day (TID) | ORAL | Status: DC
  Administered 2019-03-27 – 2019-04-02 (×18): 10 mg via ORAL
  Filled 2019-03-27 (×21): qty 2

## 2019-03-27 MED ORDER — HEPARIN SODIUM (PORCINE) 5000 UNIT/ML IJ SOLN
5000.0000 [IU] | Freq: Three times a day (TID) | INTRAMUSCULAR | Status: DC
  Administered 2019-03-30 – 2019-04-01 (×4): 5000 [IU] via SUBCUTANEOUS
  Filled 2019-03-27 (×10): qty 1

## 2019-03-27 MED ORDER — ALBUMIN HUMAN 5 % IV SOLN
25.0000 g | Freq: Once | INTRAVENOUS | Status: AC
  Administered 2019-03-27: 25 g via INTRAVENOUS
  Filled 2019-03-27: qty 500

## 2019-03-27 MED ORDER — COLESTIPOL HCL 1 G PO TABS
3.0000 g | ORAL_TABLET | Freq: Every day | ORAL | Status: DC
  Administered 2019-03-27 – 2019-04-02 (×7): 3 g via ORAL
  Filled 2019-03-27 (×7): qty 3

## 2019-03-27 MED ORDER — CITALOPRAM HYDROBROMIDE 20 MG PO TABS
10.0000 mg | ORAL_TABLET | Freq: Every day | ORAL | Status: DC
  Administered 2019-03-27 – 2019-04-01 (×6): 10 mg via ORAL
  Filled 2019-03-27 (×6): qty 1

## 2019-03-27 MED ORDER — CINACALCET HCL 30 MG PO TABS
90.0000 mg | ORAL_TABLET | Freq: Two times a day (BID) | ORAL | Status: DC
  Administered 2019-03-27 – 2019-04-01 (×10): 90 mg via ORAL
  Filled 2019-03-27 (×10): qty 3

## 2019-03-27 MED ORDER — ALBUTEROL SULFATE (2.5 MG/3ML) 0.083% IN NEBU: 2.5000 mg | INHALATION_SOLUTION | RESPIRATORY_TRACT | Status: DC | PRN

## 2019-03-27 MED ORDER — PHENYLEPHRINE HCL-NACL 10-0.9 MG/250ML-% IV SOLN: 25.0000 ug/min | INTRAVENOUS | Status: DC

## 2019-03-27 MED ORDER — PANTOPRAZOLE SODIUM 40 MG PO TBEC
40.0000 mg | DELAYED_RELEASE_TABLET | Freq: Every day | ORAL | Status: DC
  Administered 2019-03-27 – 2019-04-01 (×6): 40 mg via ORAL
  Filled 2019-03-27 (×6): qty 1

## 2019-03-27 MED ORDER — ALLOPURINOL 100 MG PO TABS
100.0000 mg | ORAL_TABLET | Freq: Every day | ORAL | Status: DC
  Administered 2019-03-27 – 2019-04-02 (×7): 100 mg via ORAL
  Filled 2019-03-27 (×7): qty 1

## 2019-03-27 MED ORDER — SODIUM CHLORIDE 0.9 % IV BOLUS
1000.0000 mL | Freq: Once | INTRAVENOUS | Status: AC
  Administered 2019-03-27: 1000 mL via INTRAVENOUS

## 2019-03-27 MED ORDER — POLYVINYL ALCOHOL 1.4 % OP SOLN
1.0000 [drp] | Freq: Three times a day (TID) | OPHTHALMIC | Status: DC | PRN
  Filled 2019-03-27: qty 15

## 2019-03-27 MED ORDER — ACETAMINOPHEN 325 MG PO TABS
650.0000 mg | ORAL_TABLET | Freq: Four times a day (QID) | ORAL | Status: DC | PRN
  Administered 2019-03-27 – 2019-03-30 (×3): 650 mg via ORAL
  Filled 2019-03-27 (×4): qty 2

## 2019-03-27 MED ORDER — LACTATED RINGERS IV BOLUS: 1000.0000 mL | Freq: Once | INTRAVENOUS | Status: DC

## 2019-03-27 MED ORDER — GABAPENTIN 600 MG PO TABS
600.0000 mg | ORAL_TABLET | Freq: Once | ORAL | Status: AC
  Administered 2019-03-27: 600 mg via ORAL
  Filled 2019-03-27: qty 1

## 2019-03-27 MED ORDER — METHIMAZOLE 5 MG PO TABS
2.5000 mg | ORAL_TABLET | Freq: Every day | ORAL | Status: DC
  Administered 2019-03-27 – 2019-04-02 (×7): 2.5 mg via ORAL
  Filled 2019-03-27 (×7): qty 1

## 2019-03-27 MED ORDER — ONDANSETRON HCL 4 MG/2ML IJ SOLN
4.0000 mg | Freq: Four times a day (QID) | INTRAMUSCULAR | Status: DC | PRN
  Administered 2019-03-29 – 2019-03-31 (×2): 4 mg via INTRAVENOUS
  Filled 2019-03-27 (×2): qty 2

## 2019-03-27 MED ORDER — POLYETHYLENE GLYCOL 3350 17 G PO PACK: 17.0000 g | PACK | Freq: Every day | ORAL | Status: DC

## 2019-03-27 MED ORDER — LACTATED RINGERS IV SOLN
INTRAVENOUS | Status: DC
  Administered 2019-03-27: 06:00:00 via INTRAVENOUS

## 2019-03-27 NOTE — ED Notes (Signed)
ED TO INPATIENT HANDOFF REPORT  ED Nurse Name and Phone #: Ophelia Charter RN  [phone]  S Name/Age/Gender [name] 68 y.o. male Room/Bed: RESUSC/RESUSC  Code Status   Code Status: Prior  Home/SNF/Other Home Patient oriented WE:[id number] x 4 Is this baseline? Yes   Triage Complete: Triage complete  Chief Complaint hypotension  Triage Note Pt to ED via [facility] with c/o hypotension.  Pt is a dialysis pt and had dialysis today.  Pt did well during that time.  After getting home pt st's he started to feel weak.  Pt's family checked his blood pressure and it was in the 70's sys.   Pt st's they may have pulled off too much fluid today.  Pt alert and oriented x's 3   Allergies Allergies  Allergen Reactions  . Statins Other (See Comments)    Weak muscles  . Ciprofloxacin Rash    Level of Care/Admitting Diagnosis ED Disposition    ED Disposition Condition [facility] Area: [place] [[id number]]  Level of Care: ICU [6]  Covid Evaluation: Confirmed COVID Negative  Diagnosis: Sepsis [facility]) [[id number]]  Admitting Physician: Roxanne Mins [[id number]]  Attending Physician: Roxanne Mins [[id number]]  Estimated length of stay: 3 - 4 days  Certification:: I certify this patient will need inpatient services for at least 2 midnights  PT Class (Do Not Modify): Inpatient [101]  PT Acc Code (Do Not Modify): Private [1]       B Medical/Surgery History Past Medical History:  Diagnosis Date  . Acute encephalopathy 10/04/2018  . Allergic rhinitis, cause unspecified 02/24/2014  . Anemia 06/16/2011  . BENIGN PROSTATIC HYPERTROPHY 10/14/2009  . CAD, NATIVE VESSEL 02/06/2009   a. 06/2007 s/p Taxus DES to the RCA;  b. 08/2016 NSTEMI in setting of SVT/PCI: LM 30ost, LAD 62m (3.0x16 Synergy DES), LCX 76m, OM1 60, RI 40, RCA 70p/m, 71m - not amenable to PCI.  [name] Cervical radiculopathy, chronic 02/23/2016   Right c5-6 by NCS/EMG  . Chest pain 08/09/2015  . Chronic combined  systolic (congestive) and diastolic (congestive) heart failure ([place])    a. 10/2016 Echo: EF 40-45%, Gr2 DD. mildly dil LA.  [name] COLONIC POLYPS, HX OF 10/14/2009  . Dementia ([place]) [id number]  . Depression 09/24/2015  . DIABETES MELLITUS, TYPE II 02/01/2010  . DYSLIPIDEMIA 06/18/2007  . ESRD (end stage renal disease) on dialysis ([place]) 08/04/2010   ESRD due to DM adn HTN, started HD in 2011. As of 2019 has a R arm graft and gets HD on TTS sched  . FOOT PAIN 08/12/2008  . GAIT DISTURBANCE 03/03/2010  . GASTROENTERITIS, VIRAL 10/14/2009  . GERD 06/18/2007  . GOITER, MULTINODULAR 12/26/2007  . GOUT 06/18/2007  . GYNECOMASTIA 07/17/2010  . Hemodialysis access, fistula mature [facility])    Dialysis T-Th-Sa ([place]) Right upper arm fistula  . Hyperlipidemia 10/16/2011  . Hyperparathyroidism, secondary ([place]) 06/16/2011  . HYPERTENSION 06/18/2007  . Hyperthyroidism   . Hypocalcemia 06/07/2010  . Ischemic cardiomyopathy    a. 10/2016 Echo: EF 40-45%.  . Lumbar stenosis with neurogenic claudication   . ONYCHOMYCOSIS, TOENAILS 12/26/2007  . OSA on CPAP 10/16/2011  . Other malaise and fatigue 11/24/2009  . PERIPHERAL NEUROPATHY 06/18/2007  . Prostate cancer ([place])   . PSVT (paroxysmal supraventricular tachycardia) ([place])    a. 67/3419 complicated by NSTEMI;  b. 11/2016 Treated w/ adenosine in ED;  c. 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  [name] PULMONARY NODULE, RIGHT LOWER  LOBE 06/08/2009  . Sleep apnea    cpap machine and o2  . TRANSAMINASES, SERUM, ELEVATED 02/01/2010  . Transfusion history    none recent  . Unspecified hypotension 01/30/2010   Past Surgical History:  Procedure Laterality Date  . A/V SHUNTOGRAM N/A 04/07/2018   Procedure: A/V SHUNTOGRAM;  Surgeon: Waynetta Sandy, MD;  Location: [place] [facility];  Service: Cardiovascular;  Laterality: N/A;  . ARTERIOVENOUS GRAFT PLACEMENT Right 2009   forearm/notes 02/01/2011  . AV FISTULA PLACEMENT  11/07/2011   Procedure: INSERTION OF ARTERIOVENOUS (AV)  GORE-TEX GRAFT ARM;  Surgeon: Tinnie Gens, MD;  Location: [place];  Service: Vascular;  Laterality: Left;  . BACK SURGERY  1998  . BALLOON DILATION N/A 11/14/2018   Procedure: BALLOON DILATION;  Surgeon: Ronnette Juniper, MD;  Location: [place];  Service: Gastroenterology;  Laterality: N/A;  . BASCILIC VEIN TRANSPOSITION Right 02/27/2013   Procedure: BASCILIC VEIN TRANSPOSITION;  Surgeon: Mal Misty, MD;  Location: [place];  Service: Vascular;  Laterality: Right;  Right Basilic Vein Transposition   . CARDIAC CATHETERIZATION N/A 08/06/2016   Procedure: Left Heart Cath and Coronary Angiography;  Surgeon: Jolaine Artist, MD;  Location: [place] [facility];  Service: Cardiovascular;  Laterality: N/A;  . CARDIAC CATHETERIZATION N/A 08/07/2016   Procedure: Coronary/Graft Atherectomy-CSI LAD;  Surgeon: Peter M Martinique, MD;  Location: [place] [facility];  Service: Cardiovascular;  Laterality: N/A;  . CERVICAL SPINE SURGERY  2/09   "to repair nerve problems in my left arm"  . CHOLECYSTECTOMY    . COLONOSCOPY WITH PROPOFOL N/A 04/26/2017   Procedure: COLONOSCOPY WITH PROPOFOL;  Surgeon: Otis Brace, MD;  Location: [place];  Service: Gastroenterology;  Laterality: N/A;  . CORONARY ANGIOPLASTY WITH STENT PLACEMENT  06/11/2008  . CORONARY ANGIOPLASTY WITH STENT PLACEMENT  06/2007   TAXUS stent to RCA/notes 01/31/2011  . ERCP N/A 11/14/2018   Procedure: ENDOSCOPIC RETROGRADE CHOLANGIOPANCREATOGRAPHY (ERCP);  Surgeon: Ronnette Juniper, MD;  Location: [place];  Service: Gastroenterology;  Laterality: N/A;  . ESOPHAGOGASTRODUODENOSCOPY  09/28/2011   Procedure: ESOPHAGOGASTRODUODENOSCOPY (EGD);  Surgeon: Missy Sabins, MD;  Location: [facility] END[facility]COPY;  Service: Endoscopy;  Laterality: N/A;  . ESOPHAGOGASTRODUODENOSCOPY N/A 04/07/2015   Procedure: ESOPHAGOGASTRODUODENOSCOPY (EGD);  Surgeon: Teena Irani, MD;  Location: [facility];  Service: Endoscopy;  Laterality: N/A;  . ESOPHAGOGASTRODUODENOSCOPY N/A 04/19/2015    Procedure: ESOPHAGOGASTRODUODENOSCOPY (EGD);  Surgeon: Arta Silence, MD;  Location: [facility] ENDOSCOPY;  Service: Endoscopy;  Laterality: N/A;  . ESOPHAGOGASTRODUODENOSCOPY  11/14/2018   Procedure: ESOPHAGOGASTRODUODENOSCOPY (EGD);  Surgeon: Ronnette Juniper, MD;  Location: [facility] ENDOSCOPY;  Service: Gastroenterology;;  . ESOPHAGOGASTRODUODENOSCOPY (EGD) WITH PROPOFOL N/A 06/13/2018   Procedure: ESOPHAGOGASTRODUODENOSCOPY (EGD) WITH PROPOFOL;  Surgeon: Wonda Horner, MD;  Location: [facility] ENDOSCOPY;  Service: Endoscopy;  Laterality: N/A;  . FLEXIBLE SIGMOIDOSCOPY N/A 05/21/2017   Procedure: Conni Elliot;  Surgeon: Clarene Essex, MD;  Location: [place];  Service: Endoscopy;  Laterality: N/A;  . FLEXIBLE SIGMOIDOSCOPY Left 07/02/2017   Procedure: FLEXIBLE SIGMOIDOSCOPY;  Surgeon: Laurence Spates, MD;  Location: [place];  Service: Endoscopy;  Laterality: Left;  . FOREIGN BODY REMOVAL  09/2003   via upper endoscopy/notes 02/12/2011  . GIVENS CAPSULE STUDY  09/30/2011   Procedure: GIVENS CAPSULE STUDY;  Surgeon: Jeryl Columbia, MD;  Location: [facility] ENDOSCOPY;  Service: Endoscopy;  Laterality: N/A;  . INSERTION OF DIALYSIS CATHETER Right 2014  . INSERTION OF DIALYSIS CATHETER Left 02/11/2013   Procedure: INSERTION OF DIALYSIS CATHETER;  Surgeon: Conrad Bryan, MD;  [facility]: [facility];  Service: Vascular;  Laterality: Left;  Ultrasound guided  . IR AV DIALY SHUNT INTRO NEEDLE/INTRACATH INITIAL W/PTA/IMG RIGHT Right 11/14/2018  . IR US GUIDE VASC ACCESS RIGHT  11/14/2018  . LUMBAR LAMINECTOMY/DECOMPRESSION MICRODISCECTOMY Bilateral 07/31/2017   Procedure: LAMINECTOMY AND FORAMINOTOMY- BILATERAL LUMBAR TWO- LUMBAR THREE;  Surgeon: Earnie Larsson, MD;  Location: [place];  Service: Neurosurgery;  Laterality: Bilateral;  LAMINECTOMY AND FORAMINOTOMY- BILATERAL LUMBAR 2- LUMBAR 3  . PERIPHERAL VASCULAR BALLOON ANGIOPLASTY  04/07/2018   Procedure: PERIPHERAL VASCULAR BALLOON ANGIOPLASTY;  Surgeon: Waynetta Sandy, MD;  Location: [place] [facility];  Service: Cardiovascular;;  RUE AVF  . REMOVAL OF A DIALYSIS CATHETER Right 02/11/2013   Procedure: REMOVAL OF A DIALYSIS CATHETER;  Surgeon: Conrad Alger, MD;  Location: [place];  Service: Vascular;  Laterality: Right;  . REMOVAL OF STONES  11/14/2018   Procedure: REMOVAL OF STONES;  Surgeon: Ronnette Juniper, MD;  Location: [place];  Service: Gastroenterology;;  . Azzie Almas DILATION N/A 04/07/2015   Procedure: Azzie Almas DILATION;  Surgeon: Teena Irani, MD;  Location: [facility] ENDOSCOPY;  Service: Endoscopy;  Laterality: N/A;  . SHUNTOGRAM N/A 09/20/2011   Procedure: Earney Mallet;  Surgeon: Conrad , MD;  [facility] C[facility] LAB;  Service: Cardiovascular;  Laterality: N/A;  . SPHINCTEROTOMY  11/14/2018   Procedure: SPHINCTEROTOMY;  Surgeon: Ronnette Juniper, MD;  Location: [facility] ENDOSCOPY;  Service: Gastroenterology;;  . SVT ABLATION N/A 11/26/2016   Procedure: SVT Ablation;  Surgeon: Will Meredith Leeds, MD;  Location: [place] [facility];  Service: Cardiovascular;  Laterality: N/A;  . TEE WITHOUT CARDIOVERSION N/A 08/25/2018   Procedure: TRANSESOPHAGEAL ECHOCARDIOGRAM (TEE);  Surgeon: Dorothy Spark, MD;  Location: [facility] ENDOSCOPY;  Service: Cardiovascular;  Laterality: N/A;  . TONSILLECTOMY    . TOTAL KNEE ARTHROPLASTY Right 08/02/2015   Procedure: TOTAL KNEE ARTHROPLASTY;  Surgeon: Renette Butters, MD;  Location: [place];  Service: Orthopedics;  Laterality: Right;  . VENOGRAM N/A 01/26/2013   Procedure: VENOGRAM;  Surgeon: Angelia Mould, MD;  Location: [facility] CATH LAB;  Service: Cardiovascular;  Laterality: N/A;     A IV Location/Drains/Wounds Patient Lines/Drains/Airways Status   Active Line/Drains/Airways    Name:   Placement date:   Placement time:   Site:   Days:   Peripheral IV 03/26/19 Left Other (Comment)   03/26/19    2233    Other (Comment)   1   Fistula / Graft   08/01/17    1223    -   603   Fistula / Graft Right Upper arm   -    -    Upper arm       Fistula / Graft Right Upper arm Arteriovenous fistula   -    -    Upper arm             Intake/Output Last 24 hours No intake or output data in the 24 hours ending 03/27/19 0409  Labs/Imaging Results for orders placed or performed during the hospital encounter of 03/26/19 (from the past 48 hour(s))  Comprehensive metabolic panel     Status: Abnormal   Collection Time: 03/26/19 10:30 PM  Result Value Ref Range   Sodium 135 135 - 145 mmol/L   Potassium 6.2 (H) 3.5 - 5.1 mmol/L    Comment: HEMOLYSIS AT THIS LEVEL MAY AFFECT RESULT   Chloride 96 (L) 98 - 111 mmol/L   CO2 24 22 - 32 mmol/L   Glucose, Bld 143 (H)  70 - 99 mg/dL   BUN 38 (H) 8 - 23 mg/dL   Creatinine, Ser 7.21 (H) 0.61 - 1.24 mg/dL   Calcium 8.8 (L) 8.9 - 10.3 mg/dL   Total Protein 7.0 6.5 - 8.1 g/dL   Albumin 3.2 (L) 3.5 - 5.0 g/dL   AST 347 (H) 15 - 41 U/L   ALT 416 (H) 0 - 44 U/L   Alkaline Phosphatase 286 (H) 38 - 126 U/L   Total Bilirubin 3.9 (H) 0.3 - 1.2 mg/dL   GFR calc non Af Amer 7 (L) >60 mL/min   GFR calc Af Amer 8 (L) >60 mL/min   Anion gap 15 5 - 15    Comment: Performed at [facility] [address], [place], [place] [address]  CBC with Differential     Status: Abnormal   Collection Time: 03/26/19 10:30 PM  Result Value Ref Range   WBC 9.5 4.0 - 10.5 K/uL   RBC 3.88 (L) 4.22 - 5.81 MIL/uL   Hemoglobin 12.5 (L) 13.0 - 17.0 g/dL   HCT 39.1 39.0 - 52.0 %   MCV 100.8 (H) 80.0 - 100.0 fL   MCH 32.2 26.0 - 34.0 pg   MCHC 32.0 30.0 - 36.0 g/dL   RDW 16.7 (H) 11.5 - 15.5 %   Platelets 197 150 - 400 K/uL   nRBC 0.0 0.0 - 0.2 %   Neutrophils Relative % 75 %   Neutro Abs 7.2 1.7 - 7.7 K/uL   Lymphocytes Relative 11 %   Lymphs Abs 1.1 0.7 - 4.0 K/uL   Monocytes Relative 12 %   Monocytes Absolute 1.1 (H) 0.1 - 1.0 K/uL   Eosinophils Relative 1 %   Eosinophils Absolute 0.1 0.0 - 0.5 K/uL   Basophils Relative 0 %   Basophils Absolute 0.0 0.0 - 0.1 K/uL   Immature Granulocytes 1 %   Abs  Immature Granulocytes 0.07 0.00 - 0.07 K/uL    Comment: Performed at [facility], [place] [address] [place], [place] [address]  Troponin I (High Sensitivity)     Status: Abnormal   Collection Time: 03/26/19 10:30 PM  Result Value Ref Range   Troponin I (High Sensitivity) 22 (H) <18 ng/L    Comment: (NOTE) Elevated high sensitivity troponin I (hsTnI) values and significant  changes across serial measurements may suggest ACS but many other  chronic and acute conditions are known to elevate hsTnI results.  Refer to the Links section for chest pain algorithms and additional  guidance. Performed at [facility], [place] [address], [place], [place] [address]   Lactic acid, plasma     Status: None   Collection Time: 03/26/19 10:35 PM  Result Value Ref Range   Lactic Acid, Venous 1.9 0.5 - 1.9 mmol/L    Comment: Performed at [facility] [address], [place], [place] [address]  SARS Coronavirus 2 (CEPHEID - Performed in [place] hospital lab), Hosp Order     Status: None   Collection Time: 03/26/19 11:25 PM   Specimen: Nasopharyngeal Swab  Result Value Ref Range   SARS Coronavirus 2 NEGATIVE NEGATIVE    Comment: (NOTE) If result is NEGATIVE SARS-CoV-2 target nucleic acids are NOT DETECTED. The SARS-CoV-2 RNA is generally detectable in upper and lower  respiratory specimens during the acute phase of infection. The lowest  concentration of SARS-CoV-2 viral copies this assay can detect is 250  copies / mL. A negative result does not preclude  SARS-CoV-2 infection  and should not be used as the sole basis for treatment or other  patient management decisions.  A negative result may occur with  improper specimen collection / handling, submission of specimen other  than nasopharyngeal swab, presence of viral mutation(s) within the  areas targeted by this assay, and inadequate number of viral copies  (<250 copies / mL). A negative result must be combined with clinical   observations, patient history, and epidemiological information. If result is POSITIVE SARS-CoV-2 target nucleic acids are DETECTED. The SARS-CoV-2 RNA is generally detectable in upper and lower  respiratory specimens dur ing the acute phase of infection.  Positive  results are indicative of active infection with SARS-CoV-2.  Clinical  correlation with patient history and other diagnostic information is  necessary to determine patient infection status.  Positive results do  not rule out bacterial infection or co-infection with other viruses. If result is PRESUMPTIVE POSTIVE SARS-CoV-2 nucleic acids MAY BE PRESENT.   A presumptive positive result was obtained on the submitted specimen  and confirmed on repeat testing.  While 2019 novel coronavirus  (SARS-CoV-2) nucleic acids may be present in the submitted sample  additional confirmatory testing may be necessary for epidemiological  and / or clinical management purposes  to differentiate between  SARS-CoV-2 and other Sarbecovirus currently known to infect humans.  If clinically indicated additional testing with an alternate test  methodology [phone]) is advised. The SARS-CoV-2 RNA is generally  detectable in upper and lower respiratory sp ecimens during the acute  phase of infection. The expected result is Negative. Fact Sheet for Patients:  StrictlyIdeas.no Fact Sheet for Healthcare Providers: BankingDealers.co.za This test is not yet approved or cleared by the [place] FDA and has been authorized for detection and/or diagnosis of SARS-CoV-2 by FDA under an Emergency Use Authorization (EUA).  This EUA will remain in effect (meaning this test can be used) for the duration of the COVID-19 declaration under Section 564(b)(1) of the Act, 21 U.S.C. section 360bbb-3(b)(1), unless the authorization is terminated or revoked sooner. Performed at [facility], [place] [address],  [place], [place] [address]   Procalcitonin     Status: None   Collection Time: 03/27/19 12:39 AM  Result Value Ref Range   Procalcitonin 20.62 ng/mL    Comment:        Interpretation: PCT >= 10 ng/mL: Important systemic inflammatory response, almost exclusively due to severe bacterial sepsis or septic shock. (NOTE)       Sepsis PCT Algorithm           Lower Respiratory Tract                                      Infection PCT Algorithm    ----------------------------     ----------------------------         PCT < 0.25 ng/mL                PCT < 0.10 ng/mL         Strongly encourage             Strongly discourage   discontinuation of antibiotics    initiation of antibiotics    ----------------------------     -----------------------------       PCT 0.25 - 0.50 ng/mL            PCT 0.10 - 0.25 ng/mL  OR       >80% decrease in PCT            Discourage initiation of                                            antibiotics      Encourage discontinuation           of antibiotics    ----------------------------     -----------------------------         PCT >= 0.50 ng/mL              PCT 0.26 - 0.50 ng/mL                AND       <80% decrease in PCT             Encourage initiation of                                             antibiotics       Encourage continuation           of antibiotics    ----------------------------     -----------------------------        PCT >= 0.50 ng/mL                  PCT > 0.50 ng/mL               AND         increase in PCT                  Strongly encourage                                      initiation of antibiotics    Strongly encourage escalation           of antibiotics                                     -----------------------------                                           PCT <= 0.25 ng/mL                                                 OR                                        > 80% decrease in PCT                                      Discontinue / Do not initiate  antibiotics Performed at [facility], [place] [address] [place], [place] [address]   Potassium     Status: Abnormal   Collection Time: 03/27/19 12:39 AM  Result Value Ref Range   Potassium 5.2 (H) 3.5 - 5.1 mmol/L    Comment: Performed at [facility] [address] [place], [place] [address]  Troponin I (High Sensitivity)     Status: Abnormal   Collection Time: 03/27/19  2:08 AM  Result Value Ref Range   Troponin I (High Sensitivity) 27 (H) <18 ng/L    Comment: (NOTE) Elevated high sensitivity troponin I (hsTnI) values and significant  changes across serial measurements may suggest ACS but many other  chronic and acute conditions are known to elevate hsTnI results.  Refer to the "Links" section for chest pain algorithms and additional  guidance. Performed at [facility], [place] [address] [place], [place] [address]    *Note: Due to a large number of results and/or encounters for the requested time period, some results have not been displayed. A complete set of results can be found in Results Review.   Dg Chest Port 1 View  Result Date: 03/26/2019 CLINICAL DATA:  Weakness and fever EXAM: PORTABLE CHEST 1 VIEW COMPARISON:  None. FINDINGS: The heart size and mediastinal contours are within normal limits. Both lungs are clear. The visualized skeletal structures are unremarkable. IMPRESSION: No active disease. Electronically Signed   By: Ulyses Jarred M.D.   On: 03/26/2019 23:20    Pending Labs Unresulted Labs (From admission, onward)    Start     Ordered   03/26/19 2232  Culture, blood (routine x 2)  BLOOD CULTURE X 2,   STAT     03/26/19 2231   Signed and Held  CBC  (heparin)  Once,   R    Comments: Baseline for heparin therapy IF NOT ALREADY DRAWN.  Notify MD if PLT < 100 K.    Signed and Held   Signed and Held  Creatinine, serum  (heparin)  Once,   R    Comments: Baseline for  heparin therapy IF NOT ALREADY DRAWN.    Signed and Held   Signed and Held  CBC  Tomorrow morning,   R     Signed and Held   Signed and Held  Basic metabolic panel  Tomorrow morning,   R     Signed and Held   Signed and Held  Magnesium  Tomorrow morning,   R     Signed and Held   Signed and Held  Phosphorus  Tomorrow morning,   R     Signed and Held   Signed and Held  Troponin I (High Sensitivity)  Once-Timed,   R    Question:  Indication  Answer:  Other   Signed and Held   Signed and Held  Lactic acid, plasma  ONCE - STAT,   R     Signed and Held          Vitals/Pain Today's Vitals   03/27/19 0245 03/27/19 0300 03/27/19 0330 03/27/19 0345  BP: (!) 91/55 (!) 90/52  (!) 65/46  Pulse:      Resp: 19   20  Temp:      TempSrc:      SpO2: 94%     Height:      PainSc: 0-No pain  0-No pain     Isolation Precautions No active isolations  Medications Medications  vancomycin variable dose per  unstable renal function (pharmacist dosing) (has no administration in time range)  ceFEPIme (MAXIPIME) 1 g in sodium chloride 0.9 % 100 mL IVPB (has no administration in time range)  midodrine (PROAMATINE) tablet 10 mg (has no administration in time range)  albumin human 5 % solution 25 g (has no administration in time range)  sodium chloride 0.9 % bolus 500 mL (0 mLs Intravenous Stopped 03/26/19 2351)  ceFEPIme (MAXIPIME) 2 g in sodium chloride 0.9 % 100 mL IVPB (0 g Intravenous Stopped 03/27/19 0038)  metroNIDAZOLE (FLAGYL) IVPB 500 mg (0 mg Intravenous Stopped 03/27/19 0149)  sodium chloride 0.9 % bolus 500 mL (0 mLs Intravenous Stopped 03/27/19 0100)  vancomycin (VANCOCIN) 2,000 mg in sodium chloride 0.9 % 500 mL IVPB (2,000 mg Intravenous New Bag/Given 03/27/19 0200)  sodium chloride 0.9 % bolus 1,000 mL (1,000 mLs Intravenous New Bag/Given 03/27/19 0340)    Mobility walks Low fall risk   Focused Assessments Renal Assessment Handoff:  Hemodialysis Schedule: Hemodialysis Schedule:  Tuesday/Thursday/Saturday Last Hemodialysis date and time: Thurs   Restricted appendage: right arm     R Recommendations: See Admitting Provider Note  Report given to:   Additional Notes:

## 2019-03-27 NOTE — Progress Notes (Addendum)
Brief Progress Note:  History of Present Illness:  68 yo male esrd on hd TTS, dementia , multiple admissions for sepsis and bacteremia, has AVF on right arm. He was brought to the ED by family after he returned from HD feeling weak and they found his BP was SBP 70'S. Wife told the ED staff that she thinks they took too much fluid off during HD and that this has happened before.  In the Ed the patient is alert and oriented but has dementia and is a poor historian. His SBP is in the 70's but he is awake and talking , denies any chest pain , sob , n/v, he just had two BM's no diarrhea. Had low grade fever on arrival. Received cefepime and vancomycin as well as 500 cc of fluids.   S:  States he feels better    O Pt. Admitted early am with hypotension post HD treatment  He received a total of 3L IVF with current  BP of 111/68 off pressors and on RA. He is awake and alert, sitting in a chair, answering questions appropriately. He is eating, and drinking He is walking in his room Frequent loose stools which is not normal for the patient Lactate 1.9 on admission High sensitivity Troponin 27>> ? Ischemic demand COVID negative Blood Cx pending LR at 75 per hour  A Hemodynamic stability after resuscitation with 3 L IVF for hypotension>> no pressors ? GI source ( Diarrhea)  P Continue ABX Continue Midrodin  Stool for C diff Enteric precautions until resulted Transfer out of ICU to progressive care unit, and to [facility] Decrease IVF to [facility] x 4 hours, if stable then saline lock as patient is taking po's without difficulty Monitor volume of stools as may have been contributing factor to hypotension Will need HD Saturday per his T/TH/ SAT schedule   Magdalen Spatz, AGACNP-BC [place] Pager # [phone] After 4 pm please call [phone] 03/27/2019 9:33 AM

## 2019-03-27 NOTE — Progress Notes (Signed)
  Echocardiogram 2D Echocardiogram has been performed.  Oneal Deputy Jakeim Sedore 03/27/2019, 9:22 AM

## 2019-03-27 NOTE — ED Notes (Signed)
  Per pt blood must be draw from port. RN [employer] informed

## 2019-03-27 NOTE — H&P (Signed)
Name: [name] MRN: [id number] DOB: 03/27/1951 Date of service: 03/27/19 Cc: hypotension   LOS: 0  [place] [facility] / Critical Care Note   History of Present Illness:  68 yo male esrd on hd TTS, dementia , multiple admissions for sepsis and bacteremia, has AVF on right arm. He was brought to the ED by family after he returned from HD feeling weak and they found his BP was SBP 70'S. Wife told the ED staff that she thinks they took too much fluid off during HD and that this has happened before.  In the Ed during my evaluation the patient is alert and oriented but has dementia and is a poor historian. His SBP is in the 70's but he is awake and talking , denies any chest pain , sob , n/v, he just had two BM's no diarrhea. Had low grade fever on arrival. Received cefepime and vancomycin as well as 500 cc of fluids.    Lines / Drains: PIV, AVF R   Cultures: pending    Antibiotics:cefepime and vancomycin   Tests / Events: COVID19 negative    Past Medical History:  Diagnosis Date  . Acute encephalopathy 10/04/2018  . Allergic rhinitis, cause unspecified 02/24/2014  . Anemia 06/16/2011  . BENIGN PROSTATIC HYPERTROPHY 10/14/2009  . CAD, NATIVE VESSEL 02/06/2009   a. 06/2007 s/p Taxus DES to the RCA;  b. 08/2016 NSTEMI in setting of SVT/PCI: LM 30ost, LAD 34m (3.0x16 Synergy DES), LCX 70m, OM1 60, RI 40, RCA 70p/m, 15m - not amenable to PCI.  [name] Cervical radiculopathy, chronic 02/23/2016   Right c5-6 by NCS/EMG  . Chest pain 08/09/2015  . Chronic combined systolic (congestive) and diastolic (congestive) heart failure ([place])    a. 10/2016 Echo: EF 40-45%, Gr2 DD. mildly dil LA.  [name] COLONIC POLYPS, HX OF 10/14/2009  . Dementia ([place]) [id number]  . Depression 09/24/2015  . DIABETES MELLITUS, TYPE II 02/01/2010  . DYSLIPIDEMIA 06/18/2007  . ESRD (end stage renal disease) on dialysis ([place]) 08/04/2010   ESRD due to DM adn HTN, started HD in 2011. As of 2019 has a R arm graft and gets HD on TTS sched   . FOOT PAIN 08/12/2008  . GAIT DISTURBANCE 03/03/2010  . GASTROENTERITIS, VIRAL 10/14/2009  . GERD 06/18/2007  . GOITER, MULTINODULAR 12/26/2007  . GOUT 06/18/2007  . GYNECOMASTIA 07/17/2010  . Hemodialysis access, fistula mature [facility])    Dialysis T-Th-Sa ([place]) Right upper arm fistula  . Hyperlipidemia 10/16/2011  . Hyperparathyroidism, secondary ([place]) 06/16/2011  . HYPERTENSION 06/18/2007  . Hyperthyroidism   . Hypocalcemia 06/07/2010  . Ischemic cardiomyopathy    a. 10/2016 Echo: EF 40-45%.  . Lumbar stenosis with neurogenic claudication   . ONYCHOMYCOSIS, TOENAILS 12/26/2007  . OSA on CPAP 10/16/2011  . Other malaise and fatigue 11/24/2009  . PERIPHERAL NEUROPATHY 06/18/2007  . Prostate cancer ([place])   . PSVT (paroxysmal supraventricular tachycardia) ([place])    a. 42/7062 complicated by NSTEMI;  b. 11/2016 Treated w/ adenosine in ED;  c. 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  [name] PULMONARY NODULE, RIGHT LOWER LOBE 06/08/2009  . Sleep apnea    cpap machine and o2  . TRANSAMINASES, SERUM, ELEVATED 02/01/2010  . Transfusion history    none recent  . Unspecified hypotension 01/30/2010    Past Surgical History:  Procedure Laterality Date  . A/V SHUNTOGRAM N/A 04/07/2018   Procedure: A/V SHUNTOGRAM;  Surgeon: Waynetta Sandy, MD;  Location: [place] [facility];  Service: Cardiovascular;  Laterality: N/A;  . ARTERIOVENOUS GRAFT PLACEMENT Right 2009   forearm/notes 02/01/2011  . AV FISTULA PLACEMENT  11/07/2011   Procedure: INSERTION OF ARTERIOVENOUS (AV) GORE-TEX GRAFT ARM;  Surgeon: Tinnie Gens, MD;  Location: [place];  Service: Vascular;  Laterality: Left;  . BACK SURGERY  1998  . BALLOON DILATION N/A 11/14/2018   Procedure: BALLOON DILATION;  Surgeon: Ronnette Juniper, MD;  Location: [place];  Service: Gastroenterology;  Laterality: N/A;  . BASCILIC VEIN TRANSPOSITION Right 02/27/2013   Procedure: BASCILIC VEIN TRANSPOSITION;  Surgeon: Mal Misty, MD;  Location: [place];  Service:  Vascular;  Laterality: Right;  Right Basilic Vein Transposition   . CARDIAC CATHETERIZATION N/A 08/06/2016   Procedure: Left Heart Cath and Coronary Angiography;  Surgeon: Jolaine Artist, MD;  Location: [place] [facility];  Service: Cardiovascular;  Laterality: N/A;  . CARDIAC CATHETERIZATION N/A 08/07/2016   Procedure: Coronary/Graft Atherectomy-CSI LAD;  Surgeon: Peter M Martinique, MD;  Location: [place] [facility];  Service: Cardiovascular;  Laterality: N/A;  . CERVICAL SPINE SURGERY  2/09   "to repair nerve problems in my left arm"  . CHOLECYSTECTOMY    . COLONOSCOPY WITH PROPOFOL N/A 04/26/2017   Procedure: COLONOSCOPY WITH PROPOFOL;  Surgeon: Otis Brace, MD;  Location: [place];  Service: Gastroenterology;  Laterality: N/A;  . CORONARY ANGIOPLASTY WITH STENT PLACEMENT  06/11/2008  . CORONARY ANGIOPLASTY WITH STENT PLACEMENT  06/2007   TAXUS stent to RCA/notes 01/31/2011  . ERCP N/A 11/14/2018   Procedure: ENDOSCOPIC RETROGRADE CHOLANGIOPANCREATOGRAPHY (ERCP);  Surgeon: Ronnette Juniper, MD;  Location: [place];  Service: Gastroenterology;  Laterality: N/A;  . ESOPHAGOGASTRODUODENOSCOPY  09/28/2011   Procedure: ESOPHAGOGASTRODUODENOSCOPY (EGD);  Surgeon: Missy Sabins, MD;  Location: [facility] ENDOSCOPY;  Service: Endoscopy;  Laterality: N/A;  . ESOPHAGOGASTRODUODENOSCOPY N/A 04/07/2015   Procedure: ESOPHAGOGASTRODUODENOSCOPY (EGD);  Surgeon: Teena Irani, MD;  Location: [facility];  Service: Endoscopy;  Laterality: N/A;  . ESOPHAGOGASTRODUODENOSCOPY N/A 04/19/2015   Procedure: ESOPHAGOGASTRODUODENOSCOPY (EGD);  Surgeon: Arta Silence, MD;  Location: [facility] ENDOSCOPY;  Service: Endoscopy;  Laterality: N/A;  . ESOPHAGOGASTRODUODENOSCOPY  11/14/2018   Procedure: ESOPHAGOGASTRODUODENOSCOPY (EGD);  Surgeon: Ronnette Juniper, MD;  Location: [name] ENDOSCOPY;  Service: Gastroenterology;;  . ESOPHAGOGASTRODUODENOSCOPY (EGD) WITH PROPOFOL N/A 06/13/2018   Procedure: ESOPHAGOGASTRODUODENOSCOPY (EGD) WITH PROPOFOL;   Surgeon: Wonda Horner, MD;  Location: [facility] ENDOSCOPY;  Service: Endoscopy;  Laterality: N/A;  . FLEXIBLE SIGMOIDOSCOPY N/A 05/21/2017   Procedure: Conni Elliot;  Surgeon: Clarene Essex, MD;  Location: [place];  Service: Endoscopy;  Laterality: N/A;  . FLEXIBLE SIGMOIDOSCOPY Left 07/02/2017   Procedure: FLEXIBLE SIGMOIDOSCOPY;  Surgeon: Laurence Spates, MD;  Location: [place];  Service: Endoscopy;  Laterality: Left;  . FOREIGN BODY REMOVAL  09/2003   via upper endoscopy/notes 02/12/2011  . GIVENS CAPSULE STUDY  09/30/2011   Procedure: GIVENS CAPSULE STUDY;  Surgeon: Jeryl Columbia, MD;  [facility];  Service: Endoscopy;  Laterality: N/A;  . INSERTION OF DIALYSIS CATHETER Right 2014  . INSERTION OF DIALYSIS CATHETER Left 02/11/2013   Procedure: INSERTION OF DIALYSIS CATHETER;  Surgeon: Conrad Cherokee, MD;  Location: [place];  Service: Vascular;  Laterality: Left;  Ultrasound guided  . IR AV DIALY SHUNT INTRO NEEDLE/INTRACATH INITIAL W/PTA/IMG RIGHT Right 11/14/2018  . IR US GUIDE VASC ACCESS RIGHT  11/14/2018  . LUMBAR LAMINECTOMY/DECOMPRESSION MICRODISCECTOMY Bilateral 07/31/2017   Procedure: LAMINECTOMY AND FORAMINOTOMY- BILATERAL LUMBAR TWO- LUMBAR THREE;  Surgeon: Earnie Larsson, MD;  Location: [place];  Service: Neurosurgery;  Laterality: Bilateral;  LAMINECTOMY AND FORAMINOTOMY- BILATERAL LUMBAR 2- LUMBAR 3  . PERIPHERAL VASCULAR BALLOON ANGIOPLASTY  04/07/2018   Procedure: PERIPHERAL VASCULAR BALLOON ANGIOPLASTY;  Surgeon: Waynetta Sandy, MD;  Location: [place] CV [facility];  Service: Cardiovascular;;  RUE AVF  . REMOVAL OF A DIALYSIS CATHETER Right 02/11/2013   Procedure: REMOVAL OF A DIALYSIS CATHETER;  Surgeon: Conrad Church Creek, MD;  Location: [place];  Service: Vascular;  Laterality: Right;  . REMOVAL OF STONES  11/14/2018   Procedure: REMOVAL OF STONES;  Surgeon: Ronnette Juniper, MD;  Location: [place];  Service: Gastroenterology;;  . Azzie Almas DILATION N/A 04/07/2015    Procedure: Azzie Almas DILATION;  Surgeon: Teena Irani, MD;  Location: [facility] ENDOSCOPY;  Service: Endoscopy;  Laterality: N/A;  . SHUNTOGRAM N/A 09/20/2011   Procedure: Earney Mallet;  Surgeon: Conrad Humphreys, MD;  Location: [facility] CATH LAB;  Service: Cardiovascular;  Laterality: N/A;  . SPHINCTEROTOMY  11/14/2018   Procedure: SPHINCTEROTOMY;  Surgeon: Ronnette Juniper, MD;  Location: [facility] ENDOSCOPY;  Service: Gastroenterology;;  . SVT ABLATION N/A 11/26/2016   Procedure: SVT Ablation;  Surgeon: Will Meredith Leeds, MD;  Location: [place] [facility];  Service: Cardiovascular;  Laterality: N/A;  . TEE WITHOUT CARDIOVERSION N/A 08/25/2018   Procedure: TRANSESOPHAGEAL ECHOCARDIOGRAM (TEE);  Surgeon: Dorothy Spark, MD;  Location: [facility] ENDOSCOPY;  Service: Cardiovascular;  Laterality: N/A;  . TONSILLECTOMY    . TOTAL KNEE ARTHROPLASTY Right 08/02/2015   Procedure: TOTAL KNEE ARTHROPLASTY;  Surgeon: Renette Butters, MD;  Location: [place];  Service: Orthopedics;  Laterality: Right;  . VENOGRAM N/A 01/26/2013   Procedure: VENOGRAM;  Surgeon: Angelia Mould, MD;  Location: [facility] CATH LAB;  Service: Cardiovascular;  Laterality: N/A;    Prior to Admission medications   Medication Sig Start Date End Date Taking? Authorizing Provider  acetaminophen (TYLENOL) 325 MG tablet Take 650 mg by mouth every 6 (six) hours as needed for mild pain or headache.     [provider]  Alirocumab (PRALUENT) 150 MG/ML SOPN Inject 150 mg into the skin every 14 (fourteen) days. 03/05/18   Lorretta Harp, MD  allopurinol (ZYLOPRIM) 100 MG tablet Take 1 tablet by mouth once daily 02/26/19   Biagio Borg, MD  aspirin EC 325 MG tablet Take 1 tablet (325 mg total) by mouth daily. 10/10/18   Hongalgi, Lenis Dickinson, MD  betamethasone dipropionate (DIPROLENE) 0.05 % cream Apply 1 application topically daily as needed (leg sores).    [provider]  citalopram (CELEXA) 10 MG tablet Take 1 tablet (10 mg total) by mouth at bedtime. 10/14/17    Biagio Borg, MD  colchicine 0.6 MG tablet Take 1 tablet (0.6 mg total) by mouth daily. Patient not taking: Reported on 02/27/2019 08/08/18   Biagio Borg, MD  colestipol (COLESTID) 1 g tablet Take 3 tablets (3 g total) by mouth daily. 02/18/19   Renato Shin, MD  doxercalciferol ([name]) 4 MCG/2ML injection Inject 1 mL (2 mcg total) into the vein Every Tuesday,Thursday,and Saturday with dialysis. 09/02/18   Kayleen Memos, DO  ezetimibe (ZETIA) 10 MG tablet TAKE ONE TABLET BY MOUTH ONCE DAILY 04/08/17   Bensimhon, Shaune Pascal, MD  ferric citrate (AURYXIA) 1 GM 210 MG(Fe) tablet Take 2 tablets (420 mg total) by mouth 3 (three) times daily with meals. 08/30/18   Kayleen Memos, DO  gabapentin (NEURONTIN) 100 MG capsule Take 1 capsule (100 mg total) by mouth at bedtime. 08/31/18   Kayleen Memos, DO  gabapentin (NEURONTIN)  300 MG capsule TAKE 1 TO 2 CAPSULES BY MOUTH AT BEDTIME AS NEEDED FOR PAIN AND FOR SLEEP 03/19/19   Biagio Borg, MD  hydroxypropyl methylcellulose / hypromellose (ISOPTO TEARS / GONIOVISC) 2.5 % ophthalmic solution Place 1 drop into both eyes 3 (three) times daily as needed for dry eyes. 05/03/17   Biagio Borg, MD  methimazole (TAPAZOLE) 5 MG tablet Take 0.5 tablets (2.5 mg total) by mouth daily. 05/16/18   Renato Shin, MD  multivitamin (RENA-VIT) TABS tablet Take 1 tablet by mouth daily.     [provider]  Nutritional Supplements (FEEDING SUPPLEMENT, NEPRO CARB STEADY,) LIQD Take 237 mLs by mouth 2 (two) times daily between meals. 08/30/18   Kayleen Memos, DO  pantoprazole (PROTONIX) 40 MG tablet Take 40 mg by mouth at bedtime.  11/03/18   [provider]  polyethylene glycol (MIRALAX / GLYCOLAX) 17 g packet Take 17 g by mouth daily. 03/06/19   Biagio Borg, MD  SENSIPAR 90 MG tablet Take 90 mg by mouth 2 (two) times daily. 09/18/16   [provider]  traMADol (ULTRAM) 50 MG tablet Take 1 tablet (50 mg total) by mouth every 6 (six) hours as needed. 03/06/19    Biagio Borg, MD  zolpidem (AMBIEN) 5 MG tablet Take 1 tablet (5 mg total) by mouth at bedtime as needed. for sleep 11/26/18   Biagio Borg, MD    Allergies Allergies  Allergen Reactions  . Statins Other (See Comments)    Weak muscles  . Ciprofloxacin Rash    Family History Family History  Problem Relation Age of Onset  . Heart disease Sister   . Thyroid nodules Sister   . Heart disease Father   . Diabetes Father   . Kidney failure Father   . Hypertension Father   . Healthy Child   . Healthy Child   . Healthy Child   . Cancer Neg Hx     Social History  reports that he quit smoking about 13 years ago. His smoking use included cigarettes. He has a 25.00 pack-year smoking history. He quit smokeless tobacco use about 13 years ago. He reports that he does not drink alcohol or use drugs.  Review Of Systems:   Vital Signs: Temp:  [99.8 F (37.7 C)-100.8 F (38.2 C)] 99.8 F (37.7 C) (06/26 0137) Pulse Rate:  [96] 96 (06/26 0000) Resp:  [15-22] 20 (06/26 0345) BP: (65-91)/(39-59) 65/46 (06/26 0345) SpO2:  [92 %-94 %] 94 % (06/26 0245) No intake/output data recorded.  Physical Examination: General:awake and oriented , nad Neuro:moves all ext and follows commands , poor insight CV: RRR no mr/g PULM:ctabl  GI: obese , bs+ soft nt nd Extremities: chronic venous changes , trace pedal edema   Ventilator settings:    Labs    CBC Recent Labs  Lab 03/26/19 2230  HGB 12.5*  HCT 39.1  WBC 9.5  PLT 197     BMET Recent Labs  Lab 03/26/19 2230 03/27/19 0039  NA 135  --   K 6.2* 5.2*  CL 96*  --   CO2 24  --   GLUCOSE 143*  --   BUN 38*  --   CREATININE 7.21*  --   CALCIUM 8.8*  --     No results for input(s): INR in the last 168 hours.  No results for input(s): PHART, PCO2ART, PO2ART, HCO3, TCO2, O2SAT in the last 168 hours.  Invalid input(s): PCO2, PO2   Radiology:  CXR - clear    Assessment and Plan: Active Problems:   Sepsis ([place])  Neuro -  hx of dementia , appears to be at his baseline , alert but poor historian  CVS/ID- hypotension related to fluid removal from HD versus early sepsis. No clear source of infection, cultures are pending. On empiric vanco/cefepime for now. PCT levels elevated, one low grade fever , WBC normal as well as normal lactic acid. Hx of bacteremia in the past . Will order TTE , continue abx , has only received 500 cc of fluids , will bolus on liter of crystalloids and 500 cc of albumin and reassess volume status.  If no response will start phenylephrine until central access can be obtained, he is a vasculopath and currently is refusing central line placement saying that he doesn't want it "because I know my BP is low because of my bile leak" .  Lungs - no issues, room air , CXR clear  Nephro - ESRD on HD , hyperkalemic , nephro c/s in am ,repeat labs  GI - BS + , no diarrhea , had normal BM's   Best practices / Disposition: -->Code Status: full -->DVT Px: HSQ -->GI Px:PPI -->Diet:npo sips with meds  Critical care time spent: 45 minutes  Luis R. Rosario MD  03/27/2019, 4:03 AM

## 2019-03-27 NOTE — ED Notes (Signed)
Patient taken off bedpan , sheets changed on bed pillow given and patient repositioned on stretcher. Multiple complaints.

## 2019-03-27 NOTE — Patient Outreach (Signed)
[facility]) Care Management  03/27/2019  [name] Jul 01, 1951 [id number]   [place] Hospitalization  Referral Date:01/16/2019 Referral Source:Transfer from [place] Reason for Referral:Continued Disease Management Insurance:Medicare   Outreach Attempt:  Received notification patient hospitalized at [facility] for hypotension post hemodialysis and possible sepsis.  RN [facility] Liaison for assessment of discharging needs.  Plan:  RN Health Coach will await recommendations from [facility].  [place] [phone] [email or web address]

## 2019-03-27 NOTE — Progress Notes (Signed)
eLink Physician-Brief Progress Note Patient Name: [name] DOB: September 02, 1951 MRN: [id number]   Date of Service  03/27/2019  HPI/Events of Note  Pt with a hx of ESRD on chronic 3 x weekly dialysis. Seen in ED following dialysis with complaints of hypotension and fever, as well as constitutional symptoms.  eICU Interventions  New patient evaluation completed. Pt is being worked up for possible sepsis.        Kerry Kass Yailine Ballard 03/27/2019, 5:10 AM

## 2019-03-27 NOTE — Consult Note (Signed)
   [facility] CM Inpatient Consult   03/27/2019  [name] 01/03/1951 [id number]  Alerted from the [place] of patient's admission. Patient is currently active with [facility] for chronic disease management services.  Patient with Medicare NG ACO.  Plan:  Patient will be followed for disposition and needs.   Of note, [facility] Care Management services does not replace or interfere with any services that are needed or arranged by inpatient case management or social work.  For additional questions or referrals please contact:  Natividad Brood, RN BSN [facility] Liaison  [phone] business mobile phone Toll free office [phone]  Fax number: [phone] [email or web address]

## 2019-03-27 NOTE — Progress Notes (Signed)
PHARMACY - PHYSICIAN COMMUNICATION CRITICAL VALUE ALERT - BLOOD CULTURE IDENTIFICATION (BCID)  [name] is an 68 y.o. male who presented to [place] on 03/26/2019   Assessment: 1/2 ecoli in blood  Name of physician (or Provider) Contacted: Eric Form, NP  Current antibiotics: cefepime  Changes to prescribed antibiotics recommended:  Dc cefepime Ceftriaxone 2  g q24h  Results for orders placed or performed during the hospital encounter of 03/26/19  Blood Culture ID Panel (Reflexed) (Collected: 03/26/2019 10:45 PM)  Result Value Ref Range   Enterococcus species NOT DETECTED NOT DETECTED   Listeria monocytogenes NOT DETECTED NOT DETECTED   Staphylococcus species NOT DETECTED NOT DETECTED   Staphylococcus aureus (BCID) NOT DETECTED NOT DETECTED   Streptococcus species NOT DETECTED NOT DETECTED   Streptococcus agalactiae NOT DETECTED NOT DETECTED   Streptococcus pneumoniae NOT DETECTED NOT DETECTED   Streptococcus pyogenes NOT DETECTED NOT DETECTED   Acinetobacter baumannii NOT DETECTED NOT DETECTED   Enterobacteriaceae species DETECTED (A) NOT DETECTED   Enterobacter cloacae complex NOT DETECTED NOT DETECTED   Escherichia coli DETECTED (A) NOT DETECTED   Klebsiella oxytoca NOT DETECTED NOT DETECTED   Klebsiella pneumoniae NOT DETECTED NOT DETECTED   Proteus species NOT DETECTED NOT DETECTED   Serratia marcescens NOT DETECTED NOT DETECTED   Carbapenem resistance NOT DETECTED NOT DETECTED   Haemophilus influenzae NOT DETECTED NOT DETECTED   Neisseria meningitidis NOT DETECTED NOT DETECTED   Pseudomonas aeruginosa NOT DETECTED NOT DETECTED   Candida albicans NOT DETECTED NOT DETECTED   Candida glabrata NOT DETECTED NOT DETECTED   Candida krusei NOT DETECTED NOT DETECTED   Candida parapsilosis NOT DETECTED NOT DETECTED   Candida tropicalis NOT DETECTED NOT DETECTED   Levester Fresh, PharmD, BCPS, BCCCP Clinical Pharmacist [phone]  Please check AMION for all [place]  numbers  03/27/2019 4:36 PM

## 2019-03-27 NOTE — Consult Note (Addendum)
[facility] Renal Consultation Note    Indication for Consultation:  Management of ESRD/hemodialysis; anemia, hypertension/volume and secondary hyperparathyroidism  HPI: [name] is a 68 y.o. male with ESRD secondary to DM on HD for 10.5 years who dialyzes TTS at [facility] with a hx of CAD, PTA stent, OSA with CPAP, gout,several episodes of Klebsiella bacteremia -  in late 2019 and again early 2020 due to cholangitis with choledocholithiasis with prior hx of cholecystectomy.   For the last two weeks he has been reportedly "feeling terrible".  He was also recently turned down by [facility] for renal transplant. His EDW has been raised slightly.  He has been afebrile at his outpatient HD including yesterday when his post HD temp was 97.5 post sitting BP was 115/53 P 90 and net UF was 1.7 with psot wt 101.7.  Pre HD sitting BP was 151/80 and standing was 132/68.   He was discharged home and late brought to the ED last pm due to feeling weak at home with a SBP 70s.  His wife felt like too much fluid was removed at dialysis. He had no SOB, CP, N, V, no diarrhea.  Does not make urine. Temperature was 100.8 upon arrival to the ED. Afebrile now. After culture, he received empiric Vanc Cefepime and IVF.He has received 2.5 L IVF since admission.  CXR upon admission was clear trop slightly elevated at 22 WBC and diff wnl. He states he hasn't had any fever or chills at home. His appetite isn't good here because of the food but he is eating well at home.  Feels like every time he gets admitted to the hospital he leaves feeling weak. Somewhat poor historian.  Past Medical History:  Diagnosis Date  . Acute encephalopathy 10/04/2018  . Allergic rhinitis, cause unspecified 02/24/2014  . Anemia 06/16/2011  . BENIGN PROSTATIC HYPERTROPHY 10/14/2009  . CAD, NATIVE VESSEL 02/06/2009   a. 06/2007 s/p Taxus DES to the RCA;  b. 08/2016 NSTEMI in setting of SVT/PCI: LM 30ost, LAD 23m(3.0x16 Synergy DES), LCX 427m OM1 60, RI 40, RCA 70p/m, 8070mnot amenable to PCI.  . CMarland Kitchenrvical radiculopathy, chronic 02/23/2016   Right c5-6 by NCS/EMG  . Chest pain 08/09/2015  . Chronic combined systolic (congestive) and diastolic (congestive) heart failure (HCCRavenna  a. 10/2016 Echo: EF 40-45%, Gr2 DD. mildly dil LA.  . CMarland KitchenLONIC POLYPS, HX OF 10/14/2009  . Dementia ([id number] Depression 09/24/2015  . DIABETES MELLITUS, TYPE II 02/01/2010  . DYSLIPIDEMIA 06/18/2007  . ESRD (end stage renal disease) on dialysis (HCCLaurys Station1/12/2009   ESRD due to DM adn HTN, started HD in 2011. As of 2019 has a R arm graft and gets HD on TTS sched  . FOOT PAIN 08/12/2008  . GAIT DISTURBANCE 03/03/2010  . GASTROENTERITIS, VIRAL 10/14/2009  . GERD 06/18/2007  . GOITER, MULTINODULAR 12/26/2007  . GOUT 06/18/2007  . GYNECOMASTIA 07/17/2010  . Hemodialysis access, fistula mature ([facility]  Dialysis [facility] ([place] upper arm fistula  . Hyperlipidemia 10/16/2011  . Hyperparathyroidism, secondary (HCCRatliff City/15/2012  . HYPERTENSION 06/18/2007  . Hyperthyroidism   . Hypocalcemia 06/07/2010  . Ischemic cardiomyopathy    a. 10/2016 Echo: EF 40-45%.  . Lumbar stenosis with neurogenic claudication   . ONYCHOMYCOSIS, TOENAILS 12/26/2007  . OSA on CPAP 10/16/2011  . Other malaise and fatigue 11/24/2009  . PERIPHERAL NEUROPATHY 06/18/2007  . Prostate cancer (HCCSiskiyou . PSVT (  paroxysmal supraventricular tachycardia) ([place])    a. 45/8099 complicated by NSTEMI;  b. 11/2016 Treated w/ adenosine in ED;  c. 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  [name] PULMONARY NODULE, RIGHT LOWER LOBE 06/08/2009  . Sleep apnea    cpap machine and o2  . TRANSAMINASES, SERUM, ELEVATED 02/01/2010  . Transfusion history    none recent  . Unspecified hypotension 01/30/2010   Past Surgical History:  Procedure Laterality Date  . A/V SHUNTOGRAM N/A 04/07/2018   Procedure: A/V SHUNTOGRAM;  Surgeon: Waynetta Sandy, MD;  Location: [place] CV LAB;  Service: Cardiovascular;   Laterality: N/A;  . ARTERIOVENOUS GRAFT PLACEMENT Right 2009   forearm/notes 02/01/2011  . AV FISTULA PLACEMENT  11/07/2011   Procedure: INSERTION OF ARTERIOVENOUS (AV) GORE-TEX GRAFT ARM;  Surgeon: Tinnie Gens, MD;  Location: [place];  Service: Vascular;  Laterality: Left;  . BACK SURGERY  1998  . BALLOON DILATION N/A 11/14/2018   Procedure: BALLOON DILATION;  Surgeon: Ronnette Juniper, MD;  Location: [place];  Service: Gastroenterology;  Laterality: N/A;  . BASCILIC VEIN TRANSPOSITION Right 02/27/2013   Procedure: BASCILIC VEIN TRANSPOSITION;  Surgeon: Mal Misty, MD;  Location: [place];  Service: Vascular;  Laterality: Right;  Right Basilic Vein Transposition   . CARDIAC CATHETERIZATION N/A 08/06/2016   Procedure: Left Heart Cath and Coronary Angiography;  Surgeon: Jolaine Artist, MD;  Location: [place] CV LAB;  Service: Cardiovascular;  Laterality: N/A;  . CARDIAC CATHETERIZATION N/A 08/07/2016   Procedure: Coronary/Graft Atherectomy-CSI LAD;  Surgeon: Peter M Martinique, MD;  Location: [place] CV LAB;  Service: Cardiovascular;  Laterality: N/A;  . CERVICAL SPINE SURGERY  2/09   "to repair nerve problems in my left arm"  . CHOLECYSTECTOMY    . COLONOSCOPY WITH PROPOFOL N/A 04/26/2017   Procedure: COLONOSCOPY WITH PROPOFOL;  Surgeon: Otis Brace, MD;  Location: [place];  Service: Gastroenterology;  Laterality: N/A;  . CORONARY ANGIOPLASTY WITH STENT PLACEMENT  06/11/2008  . CORONARY ANGIOPLASTY WITH STENT PLACEMENT  06/2007   TAXUS stent to RCA/notes 01/31/2011  . ERCP N/A 11/14/2018   Procedure: ENDOSCOPIC RETROGRADE CHOLANGIOPANCREATOGRAPHY (ERCP);  Surgeon: Ronnette Juniper, MD;  Location: [place];  Service: Gastroenterology;  Laterality: N/A;  . ESOPHAGOGASTRODUODENOSCOPY  09/28/2011   Procedure: ESOPHAGOGASTRODUODENOSCOPY (EGD);  Surgeon: Missy Sabins, MD;  [facility] END[facility]COPY;  Service: Endoscopy;  Laterality: N/A;  . ESOPHAGOGASTRODUODENOSCOPY N/A 04/07/2015   Procedure:  ESOPHAGOGASTRODUODENOSCOPY (EGD);  Surgeon: Teena Irani, MD;  [facility];  Service: Endoscopy;  Laterality: N/A;  . ESOPHAGOGASTRODUODENOSCOPY N/A 04/19/2015   Procedure: ESOPHAGOGASTRODUODENOSCOPY (EGD);  Surgeon: Arta Silence, MD;  Location: [place] ENDOSCOPY;  Service: Endoscopy;  Laterality: N/A;  . ESOPHAGOGASTRODUODENOSCOPY  11/14/2018   Procedure: ESOPHAGOGASTRODUODENOSCOPY (EGD);  Surgeon: Ronnette Juniper, MD;  Location: [facility] ENDOSCOPY;  Service: Gastroenterology;;  . ESOPHAGOGASTRODUODENOSCOPY (EGD) WITH PROPOFOL N/A 06/13/2018   Procedure: ESOPHAGOGASTRODUODENOSCOPY (EGD) WITH PROPOFOL;  Surgeon: Wonda Horner, MD;  Location: [facility] ENDOSCOPY;  Service: Endoscopy;  Laterality: N/A;  . FLEXIBLE SIGMOIDOSCOPY N/A 05/21/2017   Procedure: Conni Elliot;  Surgeon: Clarene Essex, MD;  Location: [place];  Service: Endoscopy;  Laterality: N/A;  . FLEXIBLE SIGMOIDOSCOPY Left 07/02/2017   Procedure: FLEXIBLE SIGMOIDOSCOPY;  Surgeon: Laurence Spates, MD;  Location: [place];  Service: Endoscopy;  Laterality: Left;  . FOREIGN BODY REMOVAL  09/2003   via upper endoscopy/notes 02/12/2011  . GIVENS CAPSULE STUDY  09/30/2011   Procedure: GIVENS CAPSULE STUDY;  Surgeon: Jeryl [place], MD;  Location: [place];  Service:  Endoscopy;  Laterality: N/A;  . INSERTION OF DIALYSIS CATHETER Right 2014  . INSERTION OF DIALYSIS CATHETER Left 02/11/2013   Procedure: INSERTION OF DIALYSIS CATHETER;  Surgeon: Conrad Lagrange, MD;  Location: [place];  Service: Vascular;  Laterality: Left;  Ultrasound guided  . IR AV DIALY SHUNT INTRO NEEDLE/INTRACATH INITIAL W/PTA/IMG RIGHT Right 11/14/2018  . IR US GUIDE VASC ACCESS RIGHT  11/14/2018  . LUMBAR LAMINECTOMY/DECOMPRESSION MICRODISCECTOMY Bilateral 07/31/2017   Procedure: LAMINECTOMY AND FORAMINOTOMY- BILATERAL LUMBAR TWO- LUMBAR THREE;  Surgeon: Earnie Larsson, MD;  Location: [place];  Service: Neurosurgery;  Laterality: Bilateral;  LAMINECTOMY AND FORAMINOTOMY-  BILATERAL LUMBAR 2- LUMBAR 3  . PERIPHERAL VASCULAR BALLOON ANGIOPLASTY  04/07/2018   Procedure: PERIPHERAL VASCULAR BALLOON ANGIOPLASTY;  Surgeon: Waynetta Sandy, MD;  Location: [place] CV LAB;  Service: Cardiovascular;;  RUE AVF  . REMOVAL OF A DIALYSIS CATHETER Right 02/11/2013   Procedure: REMOVAL OF A DIALYSIS CATHETER;  Surgeon: Conrad [place], MD;  Location: [place];  Service: Vascular;  Laterality: Right;  . REMOVAL OF STONES  11/14/2018   Procedure: REMOVAL OF STONES;  Surgeon: Ronnette Juniper, MD;  Location: [place];  Service: Gastroenterology;;  . Azzie Almas DILATION N/A 04/07/2015   Procedure: Azzie Almas DILATION;  Surgeon: Teena Irani, MD;  Location: [facility] ENDOSCOPY;  Service: Endoscopy;  Laterality: N/A;  . SHUNTOGRAM N/A 09/20/2011   Procedure: Earney Mallet;  Surgeon: Conrad Beedeville, MD;  Location: [facility] CATH LAB;  Service: Cardiovascular;  Laterality: N/A;  . SPHINCTEROTOMY  11/14/2018   Procedure: SPHINCTEROTOMY;  Surgeon: Ronnette Juniper, MD;  Location: [facility] ENDOSCOPY;  Service: Gastroenterology;;  . SVT ABLATION N/A 11/26/2016   Procedure: SVT Ablation;  Surgeon: Will Meredith Leeds, MD;  Location: [place] CV LAB;  Service: Cardiovascular;  Laterality: N/A;  . TEE WITHOUT CARDIOVERSION N/A 08/25/2018   Procedure: TRANSESOPHAGEAL ECHOCARDIOGRAM (TEE);  Surgeon: Dorothy Spark, MD;  Location: [facility] ENDOSCOPY;  Service: Cardiovascular;  Laterality: N/A;  . TONSILLECTOMY    . TOTAL KNEE ARTHROPLASTY Right 08/02/2015   Procedure: TOTAL KNEE ARTHROPLASTY;  Surgeon: Renette Butters, MD;  Location: [place];  Service: Orthopedics;  Laterality: Right;  . VENOGRAM N/A 01/26/2013   Procedure: VENOGRAM;  Surgeon: Angelia Mould, MD;  Location: [facility] CATH LAB;  Service: Cardiovascular;  Laterality: N/A;   Family History  Problem Relation Age of Onset  . Heart disease Sister   . Thyroid nodules Sister   . Heart disease Father   . Diabetes Father   . Kidney failure Father   . Hypertension Father    . Healthy Child   . Healthy Child   . Healthy Child   . Cancer Neg Hx    Social History:  reports that he quit smoking about 13 years ago. His smoking use included cigarettes. He has a 25.00 pack-year smoking history. He quit smokeless tobacco use about 13 years ago. He reports that he does not drink alcohol or use drugs. Allergies  Allergen Reactions  . Statins Other (See Comments)    Weak muscles  . Ciprofloxacin Rash   Prior to Admission medications   Medication Sig Start Date End Date Taking? Authorizing Provider  acetaminophen (TYLENOL) 325 MG tablet Take 650 mg by mouth every 6 (six) hours as needed for mild pain or headache.    Yes [provider]  Alirocumab (PRALUENT) 150 MG/ML SOPN Inject 150 mg into the skin every 14 (fourteen) days. 03/05/18  Yes Lorretta Harp, MD  allopurinol (ZYLOPRIM) 100 MG tablet  Take 1 tablet by mouth once daily 02/26/19  Yes Biagio Borg, MD  aspirin EC 325 MG tablet Take 1 tablet (325 mg total) by mouth daily. 10/10/18  Yes Hongalgi, Lenis Dickinson, MD  betamethasone dipropionate (DIPROLENE) 0.05 % cream Apply 1 application topically daily as needed (leg sores).   Yes [provider]  citalopram (CELEXA) 10 MG tablet Take 1 tablet (10 mg total) by mouth at bedtime. 10/14/17  Yes Biagio Borg, MD  colestipol (COLESTID) 1 g tablet Take 3 tablets (3 g total) by mouth daily. 02/18/19  Yes Renato Shin, MD  ezetimibe (ZETIA) 10 MG tablet TAKE ONE TABLET BY MOUTH ONCE DAILY Patient taking differently: Take 10 mg by mouth daily.  04/08/17  Yes Bensimhon, Shaune Pascal, MD  ferric citrate (AURYXIA) 1 GM 210 MG(Fe) tablet Take 2 tablets (420 mg total) by mouth 3 (three) times daily with meals. 08/30/18  Yes Hall, Carole N, DO  gabapentin (NEURONTIN) 300 MG capsule TAKE 1 TO 2 CAPSULES BY MOUTH AT BEDTIME AS NEEDED FOR PAIN AND FOR SLEEP Patient taking differently: Take 600 mg by mouth at bedtime.  03/19/19  Yes Biagio Borg, MD  HYDROcodone-acetaminophen  (NORCO/VICODIN) 5-325 MG tablet Take 1 tablet by mouth every 6 (six) hours as needed for pain. 03/20/19  Yes [provider]  hydroxypropyl methylcellulose / hypromellose (ISOPTO TEARS / GONIOVISC) 2.5 % ophthalmic solution Place 1 drop into both eyes 3 (three) times daily as needed for dry eyes. 05/03/17  Yes Biagio Borg, MD  methimazole (TAPAZOLE) 5 MG tablet Take 0.5 tablets (2.5 mg total) by mouth daily. 05/16/18  Yes Renato Shin, MD  multivitamin (RENA-VIT) TABS tablet Take 1 tablet by mouth daily.    Yes [provider]  Nutritional Supplements (FEEDING SUPPLEMENT, NEPRO CARB STEADY,) LIQD Take 237 mLs by mouth 2 (two) times daily between meals. 08/30/18  Yes Hall, Carole N, DO  pantoprazole (PROTONIX) 40 MG tablet Take 40 mg by mouth at bedtime.  11/03/18  Yes [provider]  polyethylene glycol (MIRALAX / GLYCOLAX) 17 g packet Take 17 g by mouth daily. Patient taking differently: Take 17 g by mouth daily as needed for mild constipation.  03/06/19  Yes Biagio Borg, MD  SENSIPAR 90 MG tablet Take 90 mg by mouth 2 (two) times daily. 09/18/16  Yes [provider]  zolpidem (AMBIEN) 5 MG tablet Take 1 tablet (5 mg total) by mouth at bedtime as needed. for sleep 11/26/18  Yes Biagio Borg, MD  colchicine 0.6 MG tablet Take 1 tablet (0.6 mg total) by mouth daily. Patient not taking: Reported on 02/27/2019 08/08/18   Biagio Borg, MD  doxercalciferol ([name]) 4 MCG/2ML injection Inject 1 mL (2 mcg total) into the vein Every Tuesday,Thursday,and Saturday with dialysis. 09/02/18   Kayleen Memos, DO  traMADol (ULTRAM) 50 MG tablet Take 1 tablet (50 mg total) by mouth every 6 (six) hours as needed. 03/06/19   Biagio Borg, MD   Current Facility-Administered Medications  Medication Dose Route Frequency Provider Last Rate Last Dose  . 0.9 %  sodium chloride infusion  250 mL Intravenous Continuous Roxanne Mins, MD      . acetaminophen (TYLENOL) tablet 650 mg  650 mg  Oral Q6H PRN Roxanne Mins, MD   650 mg at 03/27/19 1112  . albuterol (PROVENTIL) (2.5 MG/3ML) 0.083% nebulizer solution 2.5 mg  2.5 mg Nebulization Q2H PRN Roxanne Mins, MD      .  allopurinol (ZYLOPRIM) tablet 100 mg  100 mg Oral Daily Roxanne Mins, MD   100 mg at 03/27/19 0912  . aspirin EC tablet 325 mg  325 mg Oral Daily Roxanne Mins, MD   325 mg at 03/27/19 0913  . ceFEPIme (MAXIPIME) 1 g in sodium chloride 0.9 % 100 mL IVPB  1 g Intravenous Q24H Aurea Graff R, MD      . Chlorhexidine Gluconate Cloth 2 % PADS 6 each  6 each Topical Daily Aurea Graff R, MD      . cinacalcet (SENSIPAR) tablet 90 mg  90 mg Oral BID WC Roxanne Mins, MD   90 mg at 03/27/19 0756  . citalopram (CELEXA) tablet 10 mg  10 mg Oral QHS Aurea Graff R, MD      . colestipol (COLESTID) tablet 3 g  3 g Oral Daily Roxanne Mins, MD   3 g at 03/27/19 0912  . heparin injection 5,000 Units  5,000 Units Subcutaneous Q8H Roxanne Mins, MD      . lactated ringers infusion   Intravenous Continuous Magdalen Spatz, NP 20 mL/hr at 03/27/19 [phone]    . methimazole (TAPAZOLE) tablet 2.5 mg  2.5 mg Oral Daily Roxanne Mins, MD   2.5 mg at 03/27/19 0912  . midodrine (PROAMATINE) tablet 10 mg  10 mg Oral TID WC Roxanne Mins, MD   10 mg at 03/27/19 0757  . ondansetron (ZOFRAN) injection 4 mg  4 mg Intravenous Q6H PRN Roxanne Mins, MD      . pantoprazole (PROTONIX) EC tablet 40 mg  40 mg Oral QHS Aurea Graff R, MD      . phenylephrine (NEOSYNEPHRINE) 10-0.9 MG/250ML-% infusion  25-200 mcg/min Intravenous Titrated Roxanne Mins, MD      . polyvinyl alcohol (LIQUIFILM TEARS) 1.4 % ophthalmic solution 1 drop  1 drop Both Eyes TID PRN Roxanne Mins, MD       Labs: Basic Metabolic Panel: Recent Labs  Lab 03/26/19 2230 03/27/19 0039 03/27/19 1002  NA 135  --  139  K 6.2* 5.2* 4.8  CL 96*  --  104  CO2 24  --  24  GLUCOSE 143*  --  102*  BUN 38*  --  44*  CREATININE 7.21*  --  7.77*  CALCIUM 8.8*  --   8.5*  PHOS  --   --  3.2   Liver Function Tests: Recent Labs  Lab 03/26/19 2230  AST 347*  ALT 416*  ALKPHOS 286*  BILITOT 3.9*  PROT 7.0  ALBUMIN 3.2*   No results for input(s): LIPASE, AMYLASE in the last 168 hours. No results for input(s): AMMONIA in the last 168 hours. CBC: Recent Labs  Lab 03/26/19 2230 03/27/19 1002  WBC 9.5 8.4  NEUTROABS 7.2  --   HGB 12.5* 11.3*  HCT 39.1 36.0*  MCV 100.8* 100.6*  PLT 197 199   Cardiac Enzymes: No results for input(s): CKTOTAL, CKMB, CKMBINDEX, TROPONINI in the last 168 hours. CBG: Recent Labs  Lab 03/27/19 0501  GLUCAP 94   Iron Studies: No results for input(s): IRON, TIBC, TRANSFERRIN, FERRITIN in the last 72 hours. Studies/Results: Dg Chest Port 1 View  Result Date: 03/26/2019 CLINICAL DATA:  Weakness and fever EXAM: PORTABLE CHEST 1 VIEW COMPARISON:  None. FINDINGS: The heart size and mediastinal contours are within normal limits. Both lungs are clear. The visualized skeletal structures are unremarkable. IMPRESSION: No active disease. Electronically Signed  By: Ulyses Jarred M.D.   On: 03/26/2019 23:20    ROS: As per HPI otherwise negative.  Physical Exam: Vitals:   03/27/19 0500 03/27/19 0605 03/27/19 0700 03/27/19 0800  BP: 102/60 (!) 104/49 91/76   Pulse: 86 85 86   Resp: 20 18 18    Temp:    (!) 96.8 F (36 C)  TempSrc:    Axillary  SpO2: 95% 94% 93%   Weight:      Height:         General: WDWN NAD looks remarkably good today Head: NCAT sclera not icteric MMM multiple missing eeth Neck: Supple.  Lungs: CTA bilaterally without wheezes, rales, or rhonchi. Breathing is unlabored. Heart: RRR with S1 S2.  Abdomen: obese soft NT + BS Lower extremities:without edema or ischemic changes Neuro: A & O  X 3. Moves all extremities spontaneously. Psych:  Responds to questions appropriately with a normal affect. Dialysis Access: right AVF + bruit  Dialysis Orders: AF TTS 4.25  hr EDW 101 2 K 2.25 Ca AVF  heparin  none no ESA or Fe hectorol 3 P 2 var Na 148 linear Recent labs:  hgb 12.9 33% sat Ca/P ok K 5s iPTH 378   Assessment/Plan: 1. Fever/sepsis- no source identified - ^ procalcitonin- on Vanc, Maxipime Echo pending, r/o Cdiff, COVID neg 2. Elevated LFTs suggestive of shock liver AST 347 ALT 416 alk phos 285 but total bili 3.9 (last outpt alk phos was 133 12/2018 - no outpatient LFTs since last hospitalization .  Worrisome for possible stone - obstruction.  Repeat labs with HD tomorrow . He is on cholestid. 3. ESRD -  TTS - HD tomorrow K 4.8 today - admission K 6s post HD odd  4. Hypertension/volume  - chronic intra dialytic hypotension during HD treatments even when not ill. CXR neg upon admission post wt 101.8 6/25 ^ EDW for d/c to 102 - d/c profile 2 for more gradual removal of fluid- needs standing wt pre and post - he walks around at home- on midodrine for BP support  5. Anemia  - hgb 11.3 - last outpatient hgb 12.9 tsa 33% has not been on ESA or Fe- hold for now 6. Metabolic bone disease -  Continue VDRA 7. Nutrition - add supplements/vitamins  Myriam Jacobson, PA-C [place] [phone] 03/27/2019, 11:45 AM   Pt seen, examined and agree w A/P as above. ESRD pt w/ recent biliary sepsis here now w/ acute hypotension responding to fluids, concerned about biliary tract again w/ ^LFT's. However, sitting up and eating today which is a good sign and BP's 120's.  Will follow. Raise edw.  Kelly Splinter  MD 03/27/2019, 3:21 PM

## 2019-03-28 DIAGNOSIS — E0590 Thyrotoxicosis, unspecified without thyrotoxic crisis or storm: Secondary | ICD-10-CM

## 2019-03-28 DIAGNOSIS — R6520 Severe sepsis without septic shock: Secondary | ICD-10-CM

## 2019-03-28 DIAGNOSIS — R7881 Bacteremia: Secondary | ICD-10-CM

## 2019-03-28 DIAGNOSIS — A419 Sepsis, unspecified organism: Secondary | ICD-10-CM

## 2019-03-28 DIAGNOSIS — R945 Abnormal results of liver function studies: Secondary | ICD-10-CM

## 2019-03-28 LAB — COMPREHENSIVE METABOLIC PANEL
ALT: 197 U/L — ABNORMAL HIGH (ref 0–44)
AST: 86 U/L — ABNORMAL HIGH (ref 15–41)
Albumin: 2.8 g/dL — ABNORMAL LOW (ref 3.5–5.0)
Alkaline Phosphatase: 200 U/L — ABNORMAL HIGH (ref 38–126)
Anion gap: 12 (ref 5–15)
BUN: 53 mg/dL — ABNORMAL HIGH (ref 8–23)
CO2: 23 mmol/L (ref 22–32)
Calcium: 8.6 mg/dL — ABNORMAL LOW (ref 8.9–10.3)
Chloride: 104 mmol/L (ref 98–111)
Creatinine, Ser: 9.68 mg/dL — ABNORMAL HIGH (ref 0.61–1.24)
GFR calc Af Amer: 6 mL/min — ABNORMAL LOW (ref >60)
GFR calc non Af Amer: 5 mL/min — ABNORMAL LOW (ref >60)
Glucose, Bld: 99 mg/dL (ref 70–99)
Potassium: 4.8 mmol/L (ref 3.5–5.1)
Sodium: 139 mmol/L (ref 135–145)
Total Bilirubin: 2.3 mg/dL — ABNORMAL HIGH (ref 0.3–1.2)
Total Protein: 6.4 g/dL — ABNORMAL LOW (ref 6.5–8.1)

## 2019-03-28 LAB — CBC
HCT: 33.7 % — ABNORMAL LOW (ref 39.0–52.0)
Hemoglobin: 10.8 g/dL — ABNORMAL LOW (ref 13.0–17.0)
MCH: 32 pg (ref 26.0–34.0)
MCHC: 32 g/dL (ref 30.0–36.0)
MCV: 99.7 fL (ref 80.0–100.0)
Platelets: 193 10*3/uL (ref 150–400)
RBC: 3.38 MIL/uL — ABNORMAL LOW (ref 4.22–5.81)
RDW: 16.4 % — ABNORMAL HIGH (ref 11.5–15.5)
WBC: 6.7 10*3/uL (ref 4.0–10.5)
nRBC: 0 % (ref 0.0–0.2)

## 2019-03-28 LAB — LACTIC ACID, PLASMA: Lactic Acid, Venous: 0.6 mmol/L (ref 0.5–1.9)

## 2019-03-28 MED ORDER — LOPERAMIDE HCL 2 MG PO CAPS
2.0000 mg | ORAL_CAPSULE | ORAL | Status: DC | PRN
  Administered 2019-03-28 (×2): 2 mg via ORAL
  Filled 2019-03-28 (×2): qty 1

## 2019-03-28 MED ORDER — GABAPENTIN 300 MG PO CAPS
600.0000 mg | ORAL_CAPSULE | Freq: Every day | ORAL | Status: DC
  Administered 2019-03-28 – 2019-04-01 (×5): 600 mg via ORAL
  Filled 2019-03-28 (×5): qty 2

## 2019-03-28 NOTE — Progress Notes (Signed)
PROGRESS NOTE    [name]  EXB:[id number] DOB: December 30, 1950 DOA: 03/26/2019 PCP: Biagio Borg, MD   Brief Narrative:  HPI on 03/27/2019 by Dr. Aurea Graff 68 yo male esrd on hd TTS, dementia , multiple admissions for sepsis and bacteremia, has AVF on right arm. He was brought to the ED by family after he returned from HD feeling weak and they found his BP was SBP 70'S. Wife told the ED staff that she thinks they took too much fluid off during HD and that this has happened before.  In the Ed during my evaluation the patient is alert and oriented but has dementia and is a poor historian. His SBP is in the 70's but he is awake and talking , denies any chest pain , sob , n/v, he just had two BM's no diarrhea. Had low grade fever on arrival. Received cefepime and vancomycin as well as 500 cc of fluids.   Assessment & Plan   Severe sepsis secondary to E. coli bacteremia -On admission, patient was noted to have hypothermia, tachycardia, tachypnea, elevated LFTs -Blood cultures positive for gram-negative rods, E. Coli -Continue ceftriaxone -Upon review of patient's chart, he has also had Klebsiella pneumonia bacteremia in the past which was due to biliary issues.  In January 2020, patient did have to have ERCP as well as MRCP and sphincterotomy. -Will order repeat MRCP -If needed, will consult gastroenterology -Echocardiogram showed an EF of 55 to 10%, LV diastolic Doppler parameters consistent with impaired relaxation.  Calcification of the aortic valve.  Cannot rule out small vegetation on the aortic valve but not definite, would suggest TEE for further assessment.  Elevated LFTs -With a secondary to the above, shock liver, hypotension -LFTs are improving, continue to monitor CMP  ESRD -Dialyzes Tuesday, Thursday, Saturday -Nephrology consulted and appreciated  Diarrhea -C diff negative -Will place on Imodium  Anemia of chronic disease -Continue to monitor CBC  Hyperthyroidism  -Continue Tapazole  Chronic hypotension -Continue Midodrine.  Patient tells me he has not been on Midodrine in quite some time.  Depression -Continue Celexa  DVT Prophylaxis  SCDs  Code Status: Full  Family Communication: None at bedside  Disposition Plan: Admitted. Pending workup and improvement.  Consultants Nephrology   Procedures  Echocardiogram  Antibiotics   Anti-infectives (From admission, onward)   Start     Dose/Rate Route Frequency Ordered Stop   03/27/19 2300  ceFEPIme (MAXIPIME) 1 g in sodium chloride 0.9 % 100 mL IVPB  Status:  Discontinued     1 g 200 mL/hr over 30 Minutes Intravenous Every 24 hours 03/26/19 2351 03/27/19 1638   03/27/19 1645  cefTRIAXone (ROCEPHIN) 2 g in sodium chloride 0.9 % 100 mL IVPB     2 g 200 mL/hr over 30 Minutes Intravenous Every 24 hours 03/27/19 1638     03/26/19 2350  vancomycin variable dose per unstable renal function (pharmacist dosing)  Status:  Discontinued      Does not apply See admin instructions 03/26/19 2351 03/27/19 1000   03/26/19 2345  ceFEPIme (MAXIPIME) 2 g in sodium chloride 0.9 % 100 mL IVPB     2 g 200 mL/hr over 30 Minutes Intravenous  Once 03/26/19 2337 03/27/19 0038   03/26/19 2345  metroNIDAZOLE (FLAGYL) IVPB 500 mg     500 mg 100 mL/hr over 60 Minutes Intravenous  Once 03/26/19 2337 03/27/19 0149   03/26/19 2345  vancomycin (VANCOCIN) IVPB 1000 mg/200 mL premix  Status:  Discontinued  1,000 mg 200 mL/hr over 60 Minutes Intravenous  Once 03/26/19 2337 03/26/19 2339   03/26/19 2345  vancomycin (VANCOCIN) 2,000 mg in sodium chloride 0.9 % 500 mL IVPB     2,000 mg 250 mL/hr over 120 Minutes Intravenous  Once 03/26/19 2339 03/27/19 0504      Subjective:   Sabino Dick seen and examined today.  Continues to complain of diarrhea.  Has no complaints of chest pain, shortness of breath, abdominal pain, nausea or vomiting.  Does feel that his arms are swollen.  Objective:   Vitals:   03/28/19 0456  03/28/19 0828 03/28/19 1213 03/28/19 1218  BP:  128/75  129/64  Pulse: 83 79  80  Resp: (!) 23 (!) 22  20  Temp:  97.9 F (36.6 C) 98 F (36.7 C)   TempSrc:  Oral Oral   SpO2: 97% 96%  95%  Weight: 103.6 kg     Height:        Intake/Output Summary (Last 24 hours) at 03/28/2019 1337 Last data filed at 03/28/2019 1219 Gross per 24 hour  Intake 463.89 ml  Output -  Net 463.89 ml   Filed Weights   03/27/19 0430 03/28/19 0456  Weight: 102.5 kg 103.6 kg    Exam  General: Well developed, well nourished, NAD, appears stated age  71: NCAT,mucous membranes moist.   Cardiovascular: S1 S2 auscultated, RRR  Respiratory: Clear to auscultation bilaterally   Abdomen: Soft, nontender, nondistended, + bowel sounds  Extremities: warm dry without cyanosis clubbing or edema  Neuro: AAOx3, nonfocal  Psych: Normal affect and demeanor    Data Reviewed: I have personally reviewed following labs and imaging studies  CBC: Recent Labs  Lab 03/26/19 2230 03/27/19 1002 03/28/19 0323  WBC 9.5 8.4 6.7  NEUTROABS 7.2  --   --   HGB 12.5* 11.3* 10.8*  HCT 39.1 36.0* 33.7*  MCV 100.8* 100.6* 99.7  PLT 197 199 081   Basic Metabolic Panel: Recent Labs  Lab 03/26/19 2230 03/27/19 0039 03/27/19 1002 03/28/19 0323  NA 135  --  139 139  K 6.2* 5.2* 4.8 4.8  CL 96*  --  104 104  CO2 24  --  24 23  GLUCOSE 143*  --  102* 99  BUN 38*  --  44* 53*  CREATININE 7.21*  --  7.77* 9.68*  CALCIUM 8.8*  --  8.5* 8.6*  MG  --   --  1.9  --   PHOS  --   --  3.2  --    GFR: Estimated Creatinine Clearance: 9 mL/min (A) (by C-G formula based on SCr of 9.68 mg/dL (H)). Liver Function Tests: Recent Labs  Lab 03/26/19 2230 03/28/19 0323  AST 347* 86*  ALT 416* 197*  ALKPHOS 286* 200*  BILITOT 3.9* 2.3*  PROT 7.0 6.4*  ALBUMIN 3.2* 2.8*   No results for input(s): LIPASE, AMYLASE in the last 168 hours. No results for input(s): AMMONIA in the last 168 hours. Coagulation Profile: No  results for input(s): INR, PROTIME in the last 168 hours. Cardiac Enzymes: No results for input(s): CKTOTAL, CKMB, CKMBINDEX, TROPONINI in the last 168 hours. BNP (last 3 results) No results for input(s): PROBNP in the last 8760 hours. HbA1C: No results for input(s): HGBA1C in the last 72 hours. CBG: Recent Labs  Lab 03/27/19 0501  GLUCAP 94   Lipid Profile: No results for input(s): CHOL, HDL, LDLCALC, TRIG, CHOLHDL, LDLDIRECT in the last 72 hours. Thyroid Function Tests: No  results for input(s): TSH, T4TOTAL, FREET4, T3FREE, THYROIDAB in the last 72 hours. Anemia Panel: No results for input(s): VITAMINB12, FOLATE, FERRITIN, TIBC, IRON, RETICCTPCT in the last 72 hours. Urine analysis:    Component Value Date/Time   COLORURINE LT. YELLOW 06/26/2013 1200   APPEARANCEUR CLEAR 06/26/2013 1200   LABSPEC 1.020 06/26/2013 1200   PHURINE 8.5 06/26/2013 1200   GLUCOSEU 100 06/26/2013 1200   HGBUR MODERATE 06/26/2013 1200   BILIRUBINUR NEGATIVE 06/26/2013 1200   KETONESUR NEGATIVE 06/26/2013 1200   PROTEINUR >300 (A) 01/04/2010 1032   UROBILINOGEN 0.2 06/26/2013 1200   NITRITE NEGATIVE 06/26/2013 1200   LEUKOCYTESUR TRACE 06/26/2013 1200   Sepsis Labs: @LABRCNTIP (procalcitonin:4,lacticidven:4)  ) Recent Results (from the past 240 hour(s))  Culture, blood (routine x 2)     Status: None (Preliminary result)   Collection Time: 03/26/19 10:35 PM   Specimen: BLOOD  Result Value Ref Range Status   Specimen Description BLOOD LEFT ANTECUBITAL  Final   Special Requests   Final    BOTTLES DRAWN AEROBIC AND ANAEROBIC Blood Culture results may not be optimal due to an inadequate volume of blood received in culture bottles   Culture   Final    NO GROWTH 1 DAY Performed at [facility], [place] [address], [place], [place] [address]    Report Status PENDING  Incomplete  Culture, blood (routine x 2)     Status: Abnormal (Preliminary result)   Collection Time: 03/26/19 10:45 PM    Specimen: BLOOD LEFT HAND  Result Value Ref Range Status   Specimen Description BLOOD LEFT HAND  Final   Special Requests   Final    BOTTLES DRAWN AEROBIC AND ANAEROBIC Blood Culture results may not be optimal due to an inadequate volume of blood received in culture bottles   Culture  Setup Time   Final    GRAM NEGATIVE RODS IN BOTH AEROBIC AND ANAEROBIC BOTTLES Organism ID to follow CRITICAL RESULT CALLED TO, READ BACK BY AND VERIFIED WITH: Hughie Closs PharmD 16:05 03/27/19 ([email or web address]) Performed at [facility], [place] [address], [place], [place] [address]    Culture ESCHERICHIA COLI (A)  Final   Report Status PENDING  Incomplete  Blood Culture ID Panel (Reflexed)     Status: Abnormal   Collection Time: 03/26/19 10:45 PM  Result Value Ref Range Status   Enterococcus species NOT DETECTED NOT DETECTED Final   Listeria monocytogenes NOT DETECTED NOT DETECTED Final   Staphylococcus species NOT DETECTED NOT DETECTED Final   Staphylococcus aureus (BCID) NOT DETECTED NOT DETECTED Final   Streptococcus species NOT DETECTED NOT DETECTED Final   Streptococcus agalactiae NOT DETECTED NOT DETECTED Final   Streptococcus pneumoniae NOT DETECTED NOT DETECTED Final   Streptococcus pyogenes NOT DETECTED NOT DETECTED Final   Acinetobacter baumannii NOT DETECTED NOT DETECTED Final   Enterobacteriaceae species DETECTED (A) NOT DETECTED Final    Comment: Enterobacteriaceae represent a large family of gram-negative bacteria, not a single organism. CRITICAL RESULT CALLED TO, READ BACK BY AND VERIFIED WITH: Hughie Closs PharmD 16:00 03/27/19 ([email or web address])    Enterobacter cloacae complex NOT DETECTED NOT DETECTED Final   Escherichia coli DETECTED (A) NOT DETECTED Final    Comment: CRITICAL RESULT CALLED TO, READ BACK BY AND VERIFIED WITH: Hughie Closs PharmD 16:00 03/27/19 ([email or web address])    Klebsiella oxytoca NOT DETECTED NOT DETECTED Final   Klebsiella pneumoniae NOT DETECTED NOT DETECTED Final   Proteus species NOT  DETECTED NOT DETECTED Final   Serratia marcescens  NOT DETECTED NOT DETECTED Final   Carbapenem resistance NOT DETECTED NOT DETECTED Final   Haemophilus influenzae NOT DETECTED NOT DETECTED Final   Neisseria meningitidis NOT DETECTED NOT DETECTED Final   Pseudomonas aeruginosa NOT DETECTED NOT DETECTED Final   Candida albicans NOT DETECTED NOT DETECTED Final   Candida glabrata NOT DETECTED NOT DETECTED Final   Candida krusei NOT DETECTED NOT DETECTED Final   Candida parapsilosis NOT DETECTED NOT DETECTED Final   Candida tropicalis NOT DETECTED NOT DETECTED Final    Comment: Performed at [facility], [place] [address], [place], [place] [address]  SARS Coronavirus 2 (CEPHEID - Performed in [place] hospital lab), Hosp Order     Status: None   Collection Time: 03/26/19 11:25 PM   Specimen: Nasopharyngeal Swab  Result Value Ref Range Status   SARS Coronavirus 2 NEGATIVE NEGATIVE Final    Comment: (NOTE) If result is NEGATIVE SARS-CoV-2 target nucleic acids are NOT DETECTED. The SARS-CoV-2 RNA is generally detectable in upper and lower  respiratory specimens during the acute phase of infection. The lowest  concentration of SARS-CoV-2 viral copies this assay can detect is 250  copies / mL. A negative result does not preclude SARS-CoV-2 infection  and should not be used as the sole basis for treatment or other  patient management decisions.  A negative result may occur with  improper specimen collection / handling, submission of specimen other  than nasopharyngeal swab, presence of viral mutation(s) within the  areas targeted by this assay, and inadequate number of viral copies  (<250 copies / mL). A negative result must be combined with clinical  observations, patient history, and epidemiological information. If result is POSITIVE SARS-CoV-2 target nucleic acids are DETECTED. The SARS-CoV-2 RNA is generally detectable in upper and lower  respiratory specimens dur ing the acute  phase of infection.  Positive  results are indicative of active infection with SARS-CoV-2.  Clinical  correlation with patient history and other diagnostic information is  necessary to determine patient infection status.  Positive results do  not rule out bacterial infection or co-infection with other viruses. If result is PRESUMPTIVE POSTIVE SARS-CoV-2 nucleic acids MAY BE PRESENT.   A presumptive positive result was obtained on the submitted specimen  and confirmed on repeat testing.  While 2019 novel coronavirus  (SARS-CoV-2) nucleic acids may be present in the submitted sample  additional confirmatory testing may be necessary for epidemiological  and / or clinical management purposes  to differentiate between  SARS-CoV-2 and other Sarbecovirus currently known to infect humans.  If clinically indicated additional testing with an alternate test  methodology [phone]) is advised. The SARS-CoV-2 RNA is generally  detectable in upper and lower respiratory sp ecimens during the acute  phase of infection. The expected result is Negative. Fact Sheet for Patients:  StrictlyIdeas.no Fact Sheet for Healthcare Providers: BankingDealers.co.za This test is not yet approved or cleared by the [place] FDA and has been authorized for detection and/or diagnosis of SARS-CoV-2 by FDA under an Emergency Use Authorization (EUA).  This EUA will remain in effect (meaning this test can be used) for the duration of the COVID-19 declaration under Section 564(b)(1) of the Act, 21 U.S.C. section 360bbb-3(b)(1), unless the authorization is terminated or revoked sooner. Performed at [facility], [place] [address], [place], [place] [address]   MRSA PCR Screening     Status: None   Collection Time: 03/27/19  5:03 AM   Specimen: Nasal Mucosa; Nasopharyngeal  Result  Value Ref Range Status   MRSA by PCR NEGATIVE NEGATIVE Final    Comment:        The  GeneXpert MRSA Assay (FDA approved for NASAL specimens only), is one component of a comprehensive MRSA colonization surveillance program. It is not intended to diagnose MRSA infection nor to guide or monitor treatment for MRSA infections. Performed at [facility], [place] [address], [place], [place] [address]   C difficile quick scan w PCR reflex     Status: None   Collection Time: 03/27/19  9:32 AM   Specimen: STOOL  Result Value Ref Range Status   C Diff antigen NEGATIVE NEGATIVE Final   C Diff toxin NEGATIVE NEGATIVE Final   C Diff interpretation No C. difficile detected.  Final    Comment: Performed at [facility], [place] [address], [place],  [address]      Radiology Studies: Dg Chest [facility] 1 View  Result Date: 03/26/2019 CLINICAL DATA:  Weakness and fever EXAM: PORTABLE CHEST 1 VIEW COMPARISON:  None. FINDINGS: The heart size and mediastinal contours are within normal limits. Both lungs are clear. The visualized skeletal structures are unremarkable. IMPRESSION: No active disease. Electronically Signed   By: Ulyses Jarred M.D.   On: 03/26/2019 23:20     Scheduled Meds: . allopurinol  100 mg Oral Daily  . aspirin EC  325 mg Oral Daily  . Chlorhexidine Gluconate Cloth  6 each Topical Daily  . cinacalcet  90 mg Oral BID WC  . citalopram  10 mg Oral QHS  . colestipol  3 g Oral Daily  . gabapentin  600 mg Oral QHS  . heparin  5,000 Units Subcutaneous Q8H  . methimazole  2.5 mg Oral Daily  . midodrine  10 mg Oral TID WC  . pantoprazole  40 mg Oral QHS   Continuous Infusions: . sodium chloride Stopped (03/27/19 1945)  . cefTRIAXone (ROCEPHIN)  IV Stopped (03/27/19 1948)  . lactated ringers Stopped (03/27/19 1610)  . phenylephrine (NEO-SYNEPHRINE) Adult infusion       LOS: 1 day   Time Spent in minutes   45 minutes  Baruch Lewers D.O. on 03/28/2019 at 1:37 PM  Between 7am to 7pm - Please see pager noted on [email or web address]  After 7pm go to [email or web address]   And look for the night coverage person covering for me after hours  [facility] Office  [phone]

## 2019-03-28 NOTE — Progress Notes (Addendum)
Patient's wife [name] was called and updated on her husband. All questions were answered. Will continue to monitor.

## 2019-03-28 NOTE — Progress Notes (Signed)
Filled patient CPAP with water and he states he will place himself on when he is ready for bed. Will call if he needs any further assistance.

## 2019-03-28 NOTE — Progress Notes (Signed)
Received call from MRI; plan to perform MRCP on patient ASAP and patient needs to be NPO for at least 4 hours. Patient last food by mouth at approx 2000. Plan to do procedure at 0000 if not in HD. Informed patient of plan to do procedure and need to be NPO during shift assessment.   Received call from MRI at 0000, need order to change MRI to with and without contrast instead of without contrast. Provided MRI tech with Amion reference for Bodenheimer.  Patient left for MRI at 0056.   Patient returned from MRI at approximately 0150.   Called HD at 0200 to follow up about HD, HD stated no orders for HD. Informed day RN to follow up with MD to ensure HD orders placed.   Patient complained of 8/10 neuropathic pain in right hand. Stated gabapentin did not address pain. Observed displaced left upper arm IV upon return from MRI, IV team consulted and informed patient will need to replace IV. Patient also requested sleep aid. Patient expressed frustration with need for new IV and persistent pain.   Paged NP Bodenheimer at [phone]. Informed no HD orders, however,informed patient should be going to HD. Pt. complain 8/10 neuropathic pain,wants pain med.Wants sleep aid. Trazodone and 1 tablet vicodin given.   During reassessment at 0435 informed patient will not receive HD until later in the AM; informed no current order. Patient stated he understood he would likely not receive HD until much later. Patient requested door closed and lights off after lab and RN finished.   Patient requested pain medication for persistent neuropathic pain. Paged NP Bodenheimer at [phone]. Notified by NT and another RN patient irritable and requesting pain medication. RN completing dressing change with another patient. Notified by RN patient's wife called. Pain medicine administered at 0658. Patient very frustrated with persistent pain and that pain not being addressed appropriately nor in a timely manner.   Returned patient's wife  Markeese Boyajian call at approximately 0700. Informed wife Morphine given and Vicodin given earlier in the evening after patient requested pain medication. Informed wife will inform day RN of persistent pain and inform DO to determine if PRN pain medicine appropriate.

## 2019-03-28 NOTE — Progress Notes (Addendum)
[facility] Progress Note   Dialysis Orders: AF TTS   4:15 h  101kg  2/2.25 bath  AVF  Hep none no ESA or Fe  hectorol 3 Recent labs:  hgb 12.9 33% sat Ca/P ok K 5s iPTH 378   Assessment/Plan: 1. Sepsis due to E coli bacteremia.- ^ procalcitonin- on Vanc, Maxipime Echo showed mild calcification of the Ao value - could not r/o small vegetation - suggested TEE to further r/o.  r/o Cdiff, COVID neg 2. Elevated LFTs suggestive of shock liver AST 347 ALT 416 alk phos 285 but total bili 3.9 (last outpt alk phos was 133 12/2018 - no outpatient LFTs since last hospitalization .  Worrisome for possible stone - obstruction.  Repeat labs 6/27  show marked improved in LFTs and decline in T bili to 2.3  3. ESRD -  TTS - HD todayK 4.8 4. Hypertension/volume  - chronic intra dialytic hypotension during HD treatments even when not ill. CXR neg upon admission post wt 101.8 6/25 ^ EDW for d/c to 102 - d/c profile 2 for more gradual removal of fluid- needs standing wt pre and post - he walks around at home- on midodrine for BP support  5. Anemia  - hgb 11.3 > 10.8  - last outpatient hgb 12.9 tsa 33% has not been on ESA or Fe- hold for now- will addd ESA if hgb drops further  6. Metabolic bone disease -  Continue VDRA 7. Nutrition - diet + supplements/vitamins   Martha B Bergman, PA-C [facility] Beeper [phone] 03/28/2019,10:28 AM  LOS: 1 day   Pt seen, examined and agree w A/P as above.  Rob   MD 03/28/2019, 1:43 PM    Subjective:   Tells me the RN told him he has E Coli in his blood. Hasn't had breakfast. Still wearing CPAP. Having some diarrhea.  Objective Vitals:   03/28/19 0108 03/28/19 0440 03/28/19 0456 03/28/19 0828  BP:  136/73  128/75  Pulse: 74 77 83 79  Resp: 18 15 (!) 23 (!) 22  Temp:  98.9 F (37.2 C)  97.9 F (36.6 C)  TempSrc:  Axillary  Oral  SpO2: 95% 94% 97% 96%  Weight:   103.6 kg   Height:       Physical Exam General:NAD   breathing easily supine with CPAP Heart: RRR Lungs: no rales Abdomen: obese soft NTND + BS Extremities: no LE edema Dialysis Access: right AVF + bruit   Additional Objective Labs: Basic Metabolic Panel: Recent Labs  Lab 03/26/19 2230 03/27/19 0039 03/27/19 1002 03/28/19 0323  NA 135  --  139 139  K 6.2* 5.2* 4.8 4.8  CL 96*  --  104 104  CO2 24  --  24 23  GLUCOSE 143*  --  102* 99  BUN 38*  --  44* 53*  CREATININE 7.21*  --  7.77* 9.68*  CALCIUM 8.8*  --  8.5* 8.6*  PHOS  --   --  3.2  --    Liver Function Tests: Recent Labs  Lab 03/26/19 2230 03/28/19 0323  AST 347* 86*  ALT 416* 197*  ALKPHOS 286* 200*  BILITOT 3.9* 2.3*  PROT 7.0 6.4*  ALBUMIN 3.2* 2.8*   No results for input(s): LIPASE, AMYLASE in the last 168 hours. CBC: Recent Labs  Lab 03/26/19 2230 03/27/19 1002 03/28/19 0323  WBC 9.5 8.4 6.7  NEUTROABS 7.2  --   --   HGB 12.5* 11.3* 10.8*  HCT   39.1 36.0* 33.7*  MCV 100.8* 100.6* 99.7  PLT 197 199 193   Blood Culture    Component Value Date/Time   SDES BLOOD LEFT HAND 03/26/2019 2245   SPECREQUEST  03/26/2019 2245    BOTTLES DRAWN AEROBIC AND ANAEROBIC Blood Culture results may not be optimal due to an inadequate volume of blood received in culture bottles   CULT ESCHERICHIA COLI (A) 03/26/2019 2245   REPTSTATUS PENDING 03/26/2019 2245    Cardiac Enzymes: No results for input(s): CKTOTAL, CKMB, CKMBINDEX, TROPONINI in the last 168 hours. CBG: Recent Labs  Lab 03/27/19 0501  GLUCAP 94   Iron Studies: No results for input(s): IRON, TIBC, TRANSFERRIN, FERRITIN in the last 72 hours. Lab Results  Component Value Date   INR 0.99 11/14/2018   INR 1.15 08/19/2018   INR 1.12 07/16/2018   Studies/Results: Dg Chest Port 1 View  Result Date: 03/26/2019 CLINICAL DATA:  Weakness and fever EXAM: PORTABLE CHEST 1 VIEW COMPARISON:  None. FINDINGS: The heart size and mediastinal contours are within normal limits. Both lungs are clear. The  visualized skeletal structures are unremarkable. IMPRESSION: No active disease. Electronically Signed   By: Ulyses Jarred M.D.   On: 03/26/2019 23:20   Medications: . sodium chloride Stopped (03/27/19 1945)  . cefTRIAXone (ROCEPHIN)  IV Stopped (03/27/19 1948)  . lactated ringers Stopped (03/27/19 1610)  . phenylephrine (NEO-SYNEPHRINE) Adult infusion     . allopurinol  100 mg Oral Daily  . aspirin EC  325 mg Oral Daily  . Chlorhexidine Gluconate Cloth  6 each Topical Daily  . cinacalcet  90 mg Oral BID WC  . citalopram  10 mg Oral QHS  . colestipol  3 g Oral Daily  . heparin  5,000 Units Subcutaneous Q8H  . methimazole  2.5 mg Oral Daily  . midodrine  10 mg Oral TID WC  . pantoprazole  40 mg Oral QHS

## 2019-03-29 ENCOUNTER — Other Ambulatory Visit (HOSPITAL_COMMUNITY): Payer: Medicare Other

## 2019-03-29 ENCOUNTER — Inpatient Hospital Stay (HOSPITAL_COMMUNITY): Payer: Medicare Other

## 2019-03-29 LAB — BASIC METABOLIC PANEL
Anion gap: 14 (ref 5–15)
BUN: 66 mg/dL — ABNORMAL HIGH (ref 8–23)
CO2: 19 mmol/L — ABNORMAL LOW (ref 22–32)
Calcium: 8.6 mg/dL — ABNORMAL LOW (ref 8.9–10.3)
Chloride: 105 mmol/L (ref 98–111)
Creatinine, Ser: 11.67 mg/dL — ABNORMAL HIGH (ref 0.61–1.24)
GFR calc Af Amer: 5 mL/min — ABNORMAL LOW (ref >60)
GFR calc non Af Amer: 4 mL/min — ABNORMAL LOW (ref >60)
Glucose, Bld: 69 mg/dL — ABNORMAL LOW (ref 70–99)
Potassium: 5.9 mmol/L — ABNORMAL HIGH (ref 3.5–5.1)
Sodium: 138 mmol/L (ref 135–145)

## 2019-03-29 LAB — CBC
HCT: 33.7 % — ABNORMAL LOW (ref 39.0–52.0)
Hemoglobin: 10.9 g/dL — ABNORMAL LOW (ref 13.0–17.0)
MCH: 32.2 pg (ref 26.0–34.0)
MCHC: 32.3 g/dL (ref 30.0–36.0)
MCV: 99.7 fL (ref 80.0–100.0)
Platelets: 190 10*3/uL (ref 150–400)
RBC: 3.38 MIL/uL — ABNORMAL LOW (ref 4.22–5.81)
RDW: 16.5 % — ABNORMAL HIGH (ref 11.5–15.5)
WBC: 7 10*3/uL (ref 4.0–10.5)
nRBC: 0 % (ref 0.0–0.2)

## 2019-03-29 LAB — CULTURE, BLOOD (ROUTINE X 2)

## 2019-03-29 MED ORDER — MORPHINE SULFATE (PF) 2 MG/ML IV SOLN
2.0000 mg | INTRAVENOUS | Status: DC | PRN
  Administered 2019-03-29 (×2): 2 mg via INTRAVENOUS
  Filled 2019-03-29 (×3): qty 1

## 2019-03-29 MED ORDER — MORPHINE SULFATE (PF) 2 MG/ML IV SOLN
1.0000 mg | Freq: Once | INTRAVENOUS | Status: AC
  Administered 2019-03-29: 1 mg via INTRAVENOUS
  Filled 2019-03-29: qty 1

## 2019-03-29 MED ORDER — CHLORHEXIDINE GLUCONATE CLOTH 2 % EX PADS
6.0000 | MEDICATED_PAD | Freq: Every day | CUTANEOUS | Status: DC
  Administered 2019-04-02: 13:00:00 6 via TOPICAL

## 2019-03-29 MED ORDER — HYDROCODONE-ACETAMINOPHEN 5-325 MG PO TABS
1.0000 | ORAL_TABLET | Freq: Four times a day (QID) | ORAL | Status: DC | PRN
  Administered 2019-03-29 – 2019-03-31 (×6): 1 via ORAL
  Filled 2019-03-29 (×3): qty 1
  Filled 2019-03-29: qty 2
  Filled 2019-03-29 (×2): qty 1
  Filled 2019-03-29: qty 2

## 2019-03-29 MED ORDER — TRAZODONE HCL 50 MG PO TABS
50.0000 mg | ORAL_TABLET | Freq: Once | ORAL | Status: AC
  Administered 2019-03-29: 50 mg via ORAL
  Filled 2019-03-29: qty 1

## 2019-03-29 NOTE — Progress Notes (Signed)
[facility] Progress Note   Dialysis Orders: AF TTS   4:15 h  101kg  2/2.25 bath  AVF  Hep none no ESA or Fe  hectorol 3 Recent labs:  hgb 12.9 33% sat Ca/P ok K 5s iPTH 378   Assessment/Plan: 1. Sepsis due to E coli bacteremia.- ^ procalcitonin- on Vanc, Maxipime Echo showed mild calcification of the Ao value - could not r/o small vegetation - suggested TEE to further r/o.  r/o Cdiff, COVID neg.  2. Elevated LFTs suggestive of shock liver AST 347 ALT 416 alk phos 285 but total bili 3.9 (last outpt alk phos was 133 12/2018 - no outpatient LFTs since last hospitalization .  Repeat labs 6/27  show marked improved in LFTs and decline in T bili to 2.3. Episodes of Kleb pneuo in the past year related to biliary issues - per primary 3. ESRD -  TTS - HD today - off schedule - K 5.9  4. Hypertension/volume  - chronic intra dialytic hypotension during HD treatments even when not ill. CXR neg upon admission post wt 101.8 6/25 ^ EDW for d/c to 102 - d/c profile 2 for more gradual removal of fluid- needs standing wt pre and post - he walks around at home- on midodrine for BP support  5. Anemia  - hgb 11.3 > 10.9 stable  - last outpatient hgb 12.9 tsa 33% has not been on ESA or Fe- hold for now- will addd ESA if hgb drops further  6. Metabolic bone disease -  Continue VDRA 7. Nutrition - diet + supplements/vitamins   Myriam Jacobson, PA-C [facility] Beeper [phone] 03/29/2019,10:55 AM  LOS: 2 days    Subjective:  C/o severe neuropathy in 4th and 5th fingers.  Objective Vitals:   03/29/19 0213 03/29/19 0215 03/29/19 0435 03/29/19 0817  BP: (!) 174/83  (!) 149/53   Pulse:  66 72 72  Resp: 18 16 (!) 26   Temp:  97.8 F (36.6 C) 98 F (36.7 C) 97.8 F (36.6 C)  TempSrc:  Axillary Axillary Oral  SpO2:  98% 97%   Weight:      Height:       Physical Exam General:NAD  breathing easily supine with CPAP Heart: RRR Lungs: no rales Abdomen: obese soft NTND +  BS Extremities: no LE edema Dialysis Access: right AVF + bruit   Additional Objective Labs: Basic Metabolic Panel: Recent Labs  Lab 03/27/19 1002 03/28/19 0323 03/29/19 0433  NA 139 139 138  K 4.8 4.8 5.9*  CL 104 104 105  CO2 24 23 19*  GLUCOSE 102* 99 69*  BUN 44* 53* 66*  CREATININE 7.77* 9.68* 11.67*  CALCIUM 8.5* 8.6* 8.6*  PHOS 3.2  --   --    Liver Function Tests: Recent Labs  Lab 03/26/19 2230 03/28/19 0323  AST 347* 86*  ALT 416* 197*  ALKPHOS 286* 200*  BILITOT 3.9* 2.3*  PROT 7.0 6.4*  ALBUMIN 3.2* 2.8*   No results for input(s): LIPASE, AMYLASE in the last 168 hours. CBC: Recent Labs  Lab 03/26/19 2230 03/27/19 1002 03/28/19 0323 03/29/19 0433  WBC 9.5 8.4 6.7 7.0  NEUTROABS 7.2  --   --   --   HGB 12.5* 11.3* 10.8* 10.9*  HCT 39.1 36.0* 33.7* 33.7*  MCV 100.8* 100.6* 99.7 99.7  PLT 197 199 193 190   Blood Culture    Component Value Date/Time   SDES BLOOD LEFT HAND 03/26/2019 2245  SPECREQUEST  03/26/2019 2245    BOTTLES DRAWN AEROBIC AND ANAEROBIC Blood Culture results may not be optimal due to an inadequate volume of blood received in culture bottles   CULT ESCHERICHIA COLI (A) 03/26/2019 2245   REPTSTATUS 03/29/2019 FINAL 03/26/2019 2245    Cardiac Enzymes: No results for input(s): CKTOTAL, CKMB, CKMBINDEX, TROPONINI in the last 168 hours. CBG: Recent Labs  Lab 03/27/19 0501  GLUCAP 94   Iron Studies: No results for input(s): IRON, TIBC, TRANSFERRIN, FERRITIN in the last 72 hours. Lab Results  Component Value Date   INR 0.99 11/14/2018   INR 1.15 08/19/2018   INR 1.12 07/16/2018   Studies/Results: No results found. Medications: . sodium chloride Stopped (03/27/19 1945)  . cefTRIAXone (ROCEPHIN)  IV 2 g (03/28/19 1704)  . lactated ringers Stopped (03/27/19 1610)   . allopurinol  100 mg Oral Daily  . aspirin EC  325 mg Oral Daily  . Chlorhexidine Gluconate Cloth  6 each Topical Q0600  . cinacalcet  90 mg Oral BID WC   . citalopram  10 mg Oral QHS  . colestipol  3 g Oral Daily  . gabapentin  600 mg Oral QHS  . heparin  5,000 Units Subcutaneous Q8H  . methimazole  2.5 mg Oral Daily  . midodrine  10 mg Oral TID WC  . pantoprazole  40 mg Oral QHS

## 2019-03-29 NOTE — Progress Notes (Signed)
2015.. 40 minutes into HD treatment when Arterial Pressure elevated, attempted readjustment of arterial needle with no success, needle removed with clot noted, unsuccessful cannulation, Dr Jonnie Finner aware, will attempt HD in am

## 2019-03-29 NOTE — Progress Notes (Addendum)
1726: Call to wife [name]. Updated on condition.  1856: Report given to dialysis nurse.

## 2019-03-29 NOTE — Progress Notes (Signed)
PROGRESS NOTE    [name]  CHE:[id number] DOB: 04/15/1951 DOA: 03/26/2019 PCP: Biagio Borg, MD   Brief Narrative:  HPI on 03/27/2019 by Dr. Aurea Graff 68 yo male esrd on hd TTS, dementia , multiple admissions for sepsis and bacteremia, has AVF on right arm. He was brought to the ED by family after he returned from HD feeling weak and they found his BP was SBP 70'S. Wife told the ED staff that she thinks they took too much fluid off during HD and that this has happened before.  In the Ed during my evaluation the patient is alert and oriented but has dementia and is a poor historian. His SBP is in the 70's but he is awake and talking , denies any chest pain , sob , n/v, he just had two BM's no diarrhea. Had low grade fever on arrival. Received cefepime and vancomycin as well as 500 cc of fluids.   Assessment & Plan   Severe sepsis secondary to E. coli bacteremia -On admission, patient was noted to have hypothermia, tachycardia, tachypnea, elevated LFTs -Blood cultures positive for gram-negative rods, E. Coli -Continue ceftriaxone -Upon review of patient's chart, he has also had Klebsiella pneumonia bacteremia in the past which was due to biliary issues.  In January 2020, patient did have to have ERCP as well as MRCP and sphincterotomy -Echocardiogram showed an EF of 55 to 53%, LV diastolic Doppler parameters consistent with impaired relaxation.  Calcification of the aortic valve.  Cannot rule out small vegetation on the aortic valve but not definite, would suggest TEE for further assessment. -MRCP ordered, pending read  Elevated LFTs -With a secondary to the above, shock liver, hypotension -LFTs are improving, continue to monitor CMP  ESRD with hyperkalemia -Dialyzes Tuesday, Thursday, Saturday -Nephrology consulted and appreciated -patient did not dialyze on Saturday  Diarrhea -C diff negative -Continue Imodium  Anemia of chronic disease -hemoglobin stable -Continue to  monitor CBC  Hyperthyroidism -Continue Tapazole  Chronic hypotension -Continue Midodrine.  Patient tells me he has not been on Midodrine in quite some time.  Depression -Continue Celexa  Neuropathy/chronic pain -Continue gabapentin, hydrocodone PRN -Added morphine PRN  DVT Prophylaxis  SCDs  Code Status: Full  Family Communication: None at bedside  Disposition Plan: Admitted. Pending workup and improvement.  Consultants Nephrology   Procedures  Echocardiogram MRCP  Antibiotics   Anti-infectives (From admission, onward)   Start     Dose/Rate Route Frequency Ordered Stop   03/27/19 2300  ceFEPIme (MAXIPIME) 1 g in sodium chloride 0.9 % 100 mL IVPB  Status:  Discontinued     1 g 200 mL/hr over 30 Minutes Intravenous Every 24 hours 03/26/19 2351 03/27/19 1638   03/27/19 1645  cefTRIAXone (ROCEPHIN) 2 g in sodium chloride 0.9 % 100 mL IVPB     2 g 200 mL/hr over 30 Minutes Intravenous Every 24 hours 03/27/19 1638     03/26/19 2350  vancomycin variable dose per unstable renal function (pharmacist dosing)  Status:  Discontinued      Does not apply See admin instructions 03/26/19 2351 03/27/19 1000   03/26/19 2345  ceFEPIme (MAXIPIME) 2 g in sodium chloride 0.9 % 100 mL IVPB     2 g 200 mL/hr over 30 Minutes Intravenous  Once 03/26/19 2337 03/27/19 0038   03/26/19 2345  metroNIDAZOLE (FLAGYL) IVPB 500 mg     500 mg 100 mL/hr over 60 Minutes Intravenous  Once 03/26/19 2337 03/27/19 0149   03/26/19  2345  vancomycin (VANCOCIN) IVPB 1000 mg/200 mL premix  Status:  Discontinued     1,000 mg 200 mL/hr over 60 Minutes Intravenous  Once 03/26/19 2337 03/26/19 2339   03/26/19 2345  vancomycin (VANCOCIN) 2,000 mg in sodium chloride 0.9 % 500 mL IVPB     2,000 mg 250 mL/hr over 120 Minutes Intravenous  Once 03/26/19 2339 03/27/19 0504      Subjective:   [name] seen and examined today.  Complains of pain all over and particularly in his fingers. Feels we are not giving him  enough pain medications. Does not want dialyze today due to his pain. Cursing and yelling.   Objective:   Vitals:   03/29/19 0213 03/29/19 0215 03/29/19 0435 03/29/19 0817  BP: (!) 174/83  (!) 149/53   Pulse:  66 72 72  Resp: 18 16 (!) 26   Temp:  97.8 F (36.6 C) 98 F (36.7 C) 97.8 F (36.6 C)  TempSrc:  Axillary Axillary Oral  SpO2:  98% 97%   Weight:      Height:        Intake/Output Summary (Last 24 hours) at 03/29/2019 1054 Last data filed at 03/29/2019 1026 Gross per 24 hour  Intake 520 ml  Output -  Net 520 ml   Filed Weights   03/27/19 0430 03/28/19 0456  Weight: 102.5 kg 103.6 kg   Exam  General: Well developed, chronically ill appearing, NAD  HEENT: NCAT,  mucous membranes moist.   Cardiovascular: S1 S2 auscultated, RRR  Respiratory: Clear to auscultation bilaterally   Abdomen: Soft, nontender, nondistended, + bowel sounds  Extremities: warm dry without cyanosis clubbing or edema  Neuro: AAOx3, nonfocal  Psych: Cantankerous  Data Reviewed: I have personally reviewed following labs and imaging studies  CBC: Recent Labs  Lab 03/26/19 2230 03/27/19 1002 03/28/19 0323 03/29/19 0433  WBC 9.5 8.4 6.7 7.0  NEUTROABS 7.2  --   --   --   HGB 12.5* 11.3* 10.8* 10.9*  HCT 39.1 36.0* 33.7* 33.7*  MCV 100.8* 100.6* 99.7 99.7  PLT 197 199 193 903   Basic Metabolic Panel: Recent Labs  Lab 03/26/19 2230 03/27/19 0039 03/27/19 1002 03/28/19 0323 03/29/19 0433  NA 135  --  139 139 138  K 6.2* 5.2* 4.8 4.8 5.9*  CL 96*  --  104 104 105  CO2 24  --  24 23 19*  GLUCOSE 143*  --  102* 99 69*  BUN 38*  --  44* 53* 66*  CREATININE 7.21*  --  7.77* 9.68* 11.67*  CALCIUM 8.8*  --  8.5* 8.6* 8.6*  MG  --   --  1.9  --   --   PHOS  --   --  3.2  --   --    GFR: Estimated Creatinine Clearance: 7.4 mL/min (A) (by C-G formula based on SCr of 11.67 mg/dL (H)). Liver Function Tests: Recent Labs  Lab 03/26/19 2230 03/28/19 0323  AST 347* 86*  ALT 416*  197*  ALKPHOS 286* 200*  BILITOT 3.9* 2.3*  PROT 7.0 6.4*  ALBUMIN 3.2* 2.8*   No results for input(s): LIPASE, AMYLASE in the last 168 hours. No results for input(s): AMMONIA in the last 168 hours. Coagulation Profile: No results for input(s): INR, PROTIME in the last 168 hours. Cardiac Enzymes: No results for input(s): CKTOTAL, CKMB, CKMBINDEX, TROPONINI in the last 168 hours. BNP (last 3 results) No results for input(s): PROBNP in the last 8760 hours.  HbA1C: No results for input(s): HGBA1C in the last 72 hours. CBG: Recent Labs  Lab 03/27/19 0501  GLUCAP 94   Lipid Profile: No results for input(s): CHOL, HDL, LDLCALC, TRIG, CHOLHDL, LDLDIRECT in the last 72 hours. Thyroid Function Tests: No results for input(s): TSH, T4TOTAL, FREET4, T3FREE, THYROIDAB in the last 72 hours. Anemia Panel: No results for input(s): VITAMINB12, FOLATE, FERRITIN, TIBC, IRON, RETICCTPCT in the last 72 hours. Urine analysis:    Component Value Date/Time   COLORURINE LT. YELLOW 06/26/2013 1200   APPEARANCEUR CLEAR 06/26/2013 1200   LABSPEC 1.020 06/26/2013 1200   PHURINE 8.5 06/26/2013 1200   GLUCOSEU 100 06/26/2013 1200   HGBUR MODERATE 06/26/2013 1200   BILIRUBINUR NEGATIVE 06/26/2013 1200   KETONESUR NEGATIVE 06/26/2013 1200   PROTEINUR >300 (A) 01/04/2010 1032   UROBILINOGEN 0.2 06/26/2013 1200   NITRITE NEGATIVE 06/26/2013 1200   LEUKOCYTESUR TRACE 06/26/2013 1200   Sepsis Labs: @LABRCNTIP (procalcitonin:4,lacticidven:4)  ) Recent Results (from the past 240 hour(s))  Culture, blood (routine x 2)     Status: None (Preliminary result)   Collection Time: 03/26/19 10:35 PM   Specimen: BLOOD  Result Value Ref Range Status   Specimen Description BLOOD LEFT ANTECUBITAL  Final   Special Requests   Final    BOTTLES DRAWN AEROBIC AND ANAEROBIC Blood Culture results may not be optimal due to an inadequate volume of blood received in culture bottles   Culture   Final    NO GROWTH 2 DAYS  Performed at [facility], [place] [address], [place], [place] [address]    Report Status PENDING  Incomplete  Culture, blood (routine x 2)     Status: Abnormal   Collection Time: 03/26/19 10:45 PM   Specimen: BLOOD LEFT HAND  Result Value Ref Range Status   Specimen Description BLOOD LEFT HAND  Final   Special Requests   Final    BOTTLES DRAWN AEROBIC AND ANAEROBIC Blood Culture results may not be optimal due to an inadequate volume of blood received in culture bottles   Culture  Setup Time   Final    GRAM NEGATIVE RODS IN BOTH AEROBIC AND ANAEROBIC BOTTLES Organism ID to follow CRITICAL RESULT CALLED TO, READ BACK BY AND VERIFIED WITH: Hughie Closs PharmD 16:05 03/27/19 ([email or web address]) Performed at [facility], [place] [address] [place], [place] [address]    Culture ESCHERICHIA COLI (A)  Final   Report Status 03/29/2019 FINAL  Final   Organism ID, Bacteria ESCHERICHIA COLI  Final      Susceptibility   Escherichia coli - MIC*    AMPICILLIN <=2 SENSITIVE Sensitive     CEFAZOLIN <=4 SENSITIVE Sensitive     CEFEPIME <=1 SENSITIVE Sensitive     CEFTAZIDIME <=1 SENSITIVE Sensitive     CEFTRIAXONE <=1 SENSITIVE Sensitive     CIPROFLOXACIN <=0.25 SENSITIVE Sensitive     GENTAMICIN <=1 SENSITIVE Sensitive     IMIPENEM <=0.25 SENSITIVE Sensitive     TRIMETH/SULFA <=20 SENSITIVE Sensitive     AMPICILLIN/SULBACTAM <=2 SENSITIVE Sensitive     PIP/TAZO <=4 SENSITIVE Sensitive     Extended ESBL NEGATIVE Sensitive     * ESCHERICHIA COLI  Blood Culture ID Panel (Reflexed)     Status: Abnormal   Collection Time: 03/26/19 10:45 PM  Result Value Ref Range Status   Enterococcus species NOT DETECTED NOT DETECTED Final   Listeria monocytogenes NOT DETECTED NOT DETECTED Final   Staphylococcus species NOT DETECTED NOT DETECTED Final   Staphylococcus  aureus (BCID) NOT DETECTED NOT DETECTED Final   Streptococcus species NOT DETECTED NOT DETECTED Final   Streptococcus agalactiae NOT DETECTED NOT  DETECTED Final   Streptococcus pneumoniae NOT DETECTED NOT DETECTED Final   Streptococcus pyogenes NOT DETECTED NOT DETECTED Final   Acinetobacter baumannii NOT DETECTED NOT DETECTED Final   Enterobacteriaceae species DETECTED (A) NOT DETECTED Final    Comment: Enterobacteriaceae represent a large family of gram-negative bacteria, not a single organism. CRITICAL RESULT CALLED TO, READ BACK BY AND VERIFIED WITH: Hughie Closs PharmD 16:00 03/27/19 ([email or web address])    Enterobacter cloacae complex NOT DETECTED NOT DETECTED Final   Escherichia coli DETECTED (A) NOT DETECTED Final    Comment: CRITICAL RESULT CALLED TO, READ BACK BY AND VERIFIED WITH: Hughie Closs PharmD 16:00 03/27/19 ([email or web address])    Klebsiella oxytoca NOT DETECTED NOT DETECTED Final   Klebsiella pneumoniae NOT DETECTED NOT DETECTED Final   Proteus species NOT DETECTED NOT DETECTED Final   Serratia marcescens NOT DETECTED NOT DETECTED Final   Carbapenem resistance NOT DETECTED NOT DETECTED Final   Haemophilus influenzae NOT DETECTED NOT DETECTED Final   Neisseria meningitidis NOT DETECTED NOT DETECTED Final   Pseudomonas aeruginosa NOT DETECTED NOT DETECTED Final   Candida albicans NOT DETECTED NOT DETECTED Final   Candida glabrata NOT DETECTED NOT DETECTED Final   Candida krusei NOT DETECTED NOT DETECTED Final   Candida parapsilosis NOT DETECTED NOT DETECTED Final   Candida tropicalis NOT DETECTED NOT DETECTED Final    Comment: Performed at [facility], [place] [address], [place], [place] [address]  SARS Coronavirus 2 (CEPHEID - Performed in [place] [facility] lab), Hosp Order     Status: None   Collection Time: 03/26/19 11:25 PM   Specimen: Nasopharyngeal Swab  Result Value Ref Range Status   SARS Coronavirus 2 NEGATIVE NEGATIVE Final    Comment: (NOTE) If result is NEGATIVE SARS-CoV-2 target nucleic acids are NOT DETECTED. The SARS-CoV-2 RNA is generally detectable in upper and lower  respiratory specimens during the acute  phase of infection. The lowest  concentration of SARS-CoV-2 viral copies this assay can detect is 250  copies / mL. A negative result does not preclude SARS-CoV-2 infection  and should not be used as the sole basis for treatment or other  patient management decisions.  A negative result may occur with  improper specimen collection / handling, submission of specimen other  than nasopharyngeal swab, presence of viral mutation(s) within the  areas targeted by this assay, and inadequate number of viral copies  (<250 copies / mL). A negative result must be combined with clinical  observations, patient history, and epidemiological information. If result is POSITIVE SARS-CoV-2 target nucleic acids are DETECTED. The SARS-CoV-2 RNA is generally detectable in upper and lower  respiratory specimens dur ing the acute phase of infection.  Positive  results are indicative of active infection with SARS-CoV-2.  Clinical  correlation with patient history and other diagnostic information is  necessary to determine patient infection status.  Positive results do  not rule out bacterial infection or co-infection with other viruses. If result is PRESUMPTIVE POSTIVE SARS-CoV-2 nucleic acids MAY BE PRESENT.   A presumptive positive result was obtained on the submitted specimen  and confirmed on repeat testing.  While 2019 novel coronavirus  (SARS-CoV-2) nucleic acids may be present in the submitted sample  additional confirmatory testing may be necessary for epidemiological  and / or clinical management purposes  to differentiate between  SARS-CoV-2 and other Sarbecovirus currently  known to infect humans.  If clinically indicated additional testing with an alternate test  methodology [phone]) is advised. The SARS-CoV-2 RNA is generally  detectable in upper and lower respiratory sp ecimens during the acute  phase of infection. The expected result is Negative. Fact Sheet for Patients:   StrictlyIdeas.no Fact Sheet for Healthcare Providers: BankingDealers.co.za This test is not yet approved or cleared by the [place] FDA and has been authorized for detection and/or diagnosis of SARS-CoV-2 by FDA under an Emergency Use Authorization (EUA).  This EUA will remain in effect (meaning this test can be used) for the duration of the COVID-19 declaration under Section 564(b)(1) of the Act, 21 U.S.C. section 360bbb-3(b)(1), unless the authorization is terminated or revoked sooner. Performed at [facility], [place] [address] [place], [place] [address]   MRSA PCR Screening     Status: None   Collection Time: 03/27/19  5:03 AM   Specimen: Nasal Mucosa; Nasopharyngeal  Result Value Ref Range Status   MRSA by PCR NEGATIVE NEGATIVE Final    Comment:        The GeneXpert MRSA Assay (FDA approved for NASAL specimens only), is one component of a comprehensive MRSA colonization surveillance program. It is not intended to diagnose MRSA infection nor to guide or monitor treatment for MRSA infections. Performed at [facility], [place] [address] [place], [place] [address]   C difficile quick scan w PCR reflex     Status: None   Collection Time: 03/27/19  9:32 AM   Specimen: STOOL  Result Value Ref Range Status   C Diff antigen NEGATIVE NEGATIVE Final   C Diff toxin NEGATIVE NEGATIVE Final   C Diff interpretation No C. difficile detected.  Final    Comment: Performed at [facility], [place] [address], [place], [place] [address]      Radiology Studies: No results found.   Scheduled Meds: . allopurinol  100 mg Oral Daily  . aspirin EC  325 mg Oral Daily  . Chlorhexidine Gluconate Cloth  6 each Topical Q0600  . cinacalcet  90 mg Oral BID WC  . citalopram  10 mg Oral QHS  . colestipol  3 g Oral Daily  . gabapentin  600 mg Oral QHS  . heparin  5,000 Units Subcutaneous Q8H  . methimazole  2.5 mg Oral Daily  .  midodrine  10 mg Oral TID WC  . pantoprazole  40 mg Oral QHS   Continuous Infusions: . sodium chloride Stopped (03/27/19 1945)  . cefTRIAXone (ROCEPHIN)  IV 2 g (03/28/19 1704)  . lactated ringers Stopped (03/27/19 1610)     LOS: 2 days   Time Spent in minutes   45 minutes  Tajae Rybicki D.O. on 03/29/2019 at 10:54 AM  Between 7am to 7pm - Please see pager noted on [email or web address]  After 7pm go to [email or web address]  And look for the night coverage person covering for me after hours  [facility] Office  [phone]

## 2019-03-30 ENCOUNTER — Other Ambulatory Visit: Payer: Self-pay | Admitting: Pharmacy Technician

## 2019-03-30 ENCOUNTER — Encounter (HOSPITAL_COMMUNITY): Payer: Self-pay | Admitting: Gastroenterology

## 2019-03-30 LAB — CBC
HCT: 33.2 % — ABNORMAL LOW (ref 39.0–52.0)
HCT: 32.9 % — ABNORMAL LOW (ref 39.0–52.0)
Hemoglobin: 10.6 g/dL — ABNORMAL LOW (ref 13.0–17.0)
Hemoglobin: 10.7 g/dL — ABNORMAL LOW (ref 13.0–17.0)
MCH: 31.9 pg (ref 26.0–34.0)
MCH: 32.1 pg (ref 26.0–34.0)
MCHC: 31.9 g/dL (ref 30.0–36.0)
MCHC: 32.5 g/dL (ref 30.0–36.0)
MCV: 100 fL (ref 80.0–100.0)
MCV: 98.8 fL (ref 80.0–100.0)
Platelets: 203 10*3/uL (ref 150–400)
Platelets: 199 10*3/uL (ref 150–400)
RBC: 3.32 MIL/uL — ABNORMAL LOW (ref 4.22–5.81)
RBC: 3.33 MIL/uL — ABNORMAL LOW (ref 4.22–5.81)
RDW: 16.3 % — ABNORMAL HIGH (ref 11.5–15.5)
RDW: 16.2 % — ABNORMAL HIGH (ref 11.5–15.5)
WBC: 6.9 10*3/uL (ref 4.0–10.5)
WBC: 7.7 10*3/uL (ref 4.0–10.5)
nRBC: 0 % (ref 0.0–0.2)
nRBC: 0 % (ref 0.0–0.2)

## 2019-03-30 LAB — COMPREHENSIVE METABOLIC PANEL
ALT: 102 U/L — ABNORMAL HIGH (ref 0–44)
AST: 42 U/L — ABNORMAL HIGH (ref 15–41)
Albumin: 2.7 g/dL — ABNORMAL LOW (ref 3.5–5.0)
Alkaline Phosphatase: 206 U/L — ABNORMAL HIGH (ref 38–126)
Anion gap: 17 — ABNORMAL HIGH (ref 5–15)
BUN: 66 mg/dL — ABNORMAL HIGH (ref 8–23)
CO2: 20 mmol/L — ABNORMAL LOW (ref 22–32)
Calcium: 8 mg/dL — ABNORMAL LOW (ref 8.9–10.3)
Chloride: 103 mmol/L (ref 98–111)
Creatinine, Ser: 12.38 mg/dL — ABNORMAL HIGH (ref 0.61–1.24)
GFR calc Af Amer: 4 mL/min — ABNORMAL LOW (ref >60)
GFR calc non Af Amer: 4 mL/min — ABNORMAL LOW (ref >60)
Glucose, Bld: 72 mg/dL (ref 70–99)
Potassium: 5.4 mmol/L — ABNORMAL HIGH (ref 3.5–5.1)
Sodium: 140 mmol/L (ref 135–145)
Total Bilirubin: 1 mg/dL (ref 0.3–1.2)
Total Protein: 5.9 g/dL — ABNORMAL LOW (ref 6.5–8.1)

## 2019-03-30 LAB — RENAL FUNCTION PANEL
Albumin: 2.9 g/dL — ABNORMAL LOW (ref 3.5–5.0)
Anion gap: 14 (ref 5–15)
BUN: 32 mg/dL — ABNORMAL HIGH (ref 8–23)
CO2: 26 mmol/L (ref 22–32)
Calcium: 8.2 mg/dL — ABNORMAL LOW (ref 8.9–10.3)
Chloride: 99 mmol/L (ref 98–111)
Creatinine, Ser: 7.68 mg/dL — ABNORMAL HIGH (ref 0.61–1.24)
GFR calc Af Amer: 7 mL/min — ABNORMAL LOW (ref >60)
GFR calc non Af Amer: 6 mL/min — ABNORMAL LOW (ref >60)
Glucose, Bld: 85 mg/dL (ref 70–99)
Phosphorus: 3.7 mg/dL (ref 2.5–4.6)
Potassium: 4.2 mmol/L (ref 3.5–5.1)
Sodium: 139 mmol/L (ref 135–145)

## 2019-03-30 MED ORDER — HYDROCODONE-ACETAMINOPHEN 5-325 MG PO TABS
ORAL_TABLET | ORAL | Status: AC
  Filled 2019-03-30: qty 1

## 2019-03-30 MED ORDER — SODIUM CHLORIDE 0.9 % IV SOLN: 100.0000 mL | INTRAVENOUS | Status: DC | PRN

## 2019-03-30 MED ORDER — LIDOCAINE-PRILOCAINE 2.5-2.5 % EX CREA
1.0000 "application " | TOPICAL_CREAM | CUTANEOUS | Status: DC | PRN
  Filled 2019-03-30: qty 5

## 2019-03-30 MED ORDER — PENTAFLUOROPROP-TETRAFLUOROETH EX AERO: 1.0000 "application " | INHALATION_SPRAY | CUTANEOUS | Status: DC | PRN

## 2019-03-30 MED ORDER — LIDOCAINE-PRILOCAINE 2.5-2.5 % EX CREA: 1.0000 "application " | TOPICAL_CREAM | CUTANEOUS | Status: DC | PRN

## 2019-03-30 MED ORDER — LIDOCAINE HCL (PF) 1 % IJ SOLN
5.0000 mL | INTRAMUSCULAR | Status: DC | PRN
  Filled 2019-03-30: qty 5

## 2019-03-30 MED ORDER — ALTEPLASE 2 MG IJ SOLR
2.0000 mg | Freq: Once | INTRAMUSCULAR | Status: DC | PRN
  Filled 2019-03-30: qty 2

## 2019-03-30 MED ORDER — LIDOCAINE HCL (PF) 1 % IJ SOLN: 5.0000 mL | INTRAMUSCULAR | Status: DC | PRN

## 2019-03-30 MED ORDER — NEPRO/CARBSTEADY PO LIQD
237.0000 mL | Freq: Two times a day (BID) | ORAL | Status: DC
  Administered 2019-03-30 – 2019-04-02 (×5): 237 mL via ORAL

## 2019-03-30 MED ORDER — SODIUM CHLORIDE 0.9 % IV SOLN
INTRAVENOUS | Status: DC
  Administered 2019-03-30 – 2019-03-31 (×2): via INTRAVENOUS

## 2019-03-30 MED ORDER — HEPARIN SODIUM (PORCINE) 1000 UNIT/ML DIALYSIS
1000.0000 [IU] | INTRAMUSCULAR | Status: DC | PRN
  Filled 2019-03-30: qty 1

## 2019-03-30 NOTE — Progress Notes (Signed)
To dialysis. Updating wife on dialysis today and planned ERCP tomorrow. Wife asked about whether he would have dialysis again tomorrow to get back to his normal Tue Thurs Sat schedule and I explained it was up to Nephrology. [name] (wife) was grateful for the call.

## 2019-03-30 NOTE — Patient Outreach (Signed)
[facility]) Care Management  03/30/2019  [name] 1950/12/20 [id number]  Successful outreach call placed to patient's wife [name] in regards to [employer] application for [employer].  Spoke to [place], HIPAA identifiers verified.She informed husband was currently in the hospital.  Patient's wife informed that he received a 90 days supply of Zetia. Discussed refill procedure with wife. Informed her to call the refills into the company and that phone number was located on the bottle of medication. Informed her to call the medication refills in when their is approximately 10-14 days supply left to avoid a delay in therapy. Patient's wife verbalized understanding. Confirmed patient had name and number if any questions about patient assistance.  Will route note to [place] that patient assistance has been completed and will remove myself from care team as patient's wife had no other questions or concerns relating to patient assistance.  Cj Beecher P. Jaden Abreu, [facility] [phone]

## 2019-03-30 NOTE — Progress Notes (Signed)
[facility] Progress Note   Subjective: Awake, says he still feels weak.   Objective Vitals:   03/29/19 2154 03/30/19 0032 03/30/19 0511 03/30/19 0941  BP: 140/66 (!) 142/55 (!) 133/55 (!) 124/49  Pulse: 61 66 64 65  Resp:  11 14   Temp:  (!) 97.4 F (36.3 C) 98.1 F (36.7 C)   TempSrc:  Oral Oral   SpO2: 99% 100% 100% 98%  Weight:      Height:       Physical Exam General: Pleasant older male in NAD Heart: S1,S2 RRR Lungs: CTAB A/P Uses CPAP Abdomen: soft, NT Extremities: No LE edema Dialysis Access: R AVF + bruit   Additional Objective Labs: Basic Metabolic Panel: Recent Labs  Lab 03/27/19 1002 03/28/19 0323 03/29/19 0433 03/30/19 0353  NA 139 139 138 140  K 4.8 4.8 5.9* 5.4*  CL 104 104 105 103  CO2 24 23 19* 20*  GLUCOSE 102* 99 69* 72  BUN 44* 53* 66* 66*  CREATININE 7.77* 9.68* 11.67* 12.38*  CALCIUM 8.5* 8.6* 8.6* 8.0*  PHOS 3.2  --   --   --    Liver Function Tests: Recent Labs  Lab 03/26/19 2230 03/28/19 0323 03/30/19 0353  AST 347* 86* 42*  ALT 416* 197* 102*  ALKPHOS 286* 200* 206*  BILITOT 3.9* 2.3* 1.0  PROT 7.0 6.4* 5.9*  ALBUMIN 3.2* 2.8* 2.7*   No results for input(s): LIPASE, AMYLASE in the last 168 hours. CBC: Recent Labs  Lab 03/26/19 2230 03/27/19 1002 03/28/19 0323 03/29/19 0433 03/30/19 0353  WBC 9.5 8.4 6.7 7.0 6.9  NEUTROABS 7.2  --   --   --   --   HGB 12.5* 11.3* 10.8* 10.9* 10.6*  HCT 39.1 36.0* 33.7* 33.7* 33.2*  MCV 100.8* 100.6* 99.7 99.7 100.0  PLT 197 199 193 190 203   Blood Culture    Component Value Date/Time   SDES BLOOD LEFT HAND 03/26/2019 2245   SPECREQUEST  03/26/2019 2245    BOTTLES DRAWN AEROBIC AND ANAEROBIC Blood Culture results may not be optimal due to an inadequate volume of blood received in culture bottles   CULT ESCHERICHIA COLI (A) 03/26/2019 2245   REPTSTATUS 03/29/2019 FINAL 03/26/2019 2245    Cardiac Enzymes: No results for input(s): CKTOTAL, CKMB, CKMBINDEX,  TROPONINI in the last 168 hours. CBG: Recent Labs  Lab 03/27/19 0501  GLUCAP 94   Iron Studies: No results for input(s): IRON, TIBC, TRANSFERRIN, FERRITIN in the last 72 hours. @lablastinr3 @ Studies/Results: Mr Abdomen Mrcp Wo Contrast  Result Date: 03/29/2019 CLINICAL DATA:  Choledocholithiasis. Bacteremia. Status post sphincterotomy approximately 4 months ago. Renal insufficiency. EXAM: MRI ABDOMEN WITHOUT CONTRAST  (INCLUDING MRCP) TECHNIQUE: Multiplanar multisequence MR imaging of the abdomen was performed. Heavily T2-weighted images of the biliary and pancreatic ducts were obtained, and three-dimensional MRCP images were rendered by post processing. COMPARISON:  11/13/2018 FINDINGS: Lower chest: No acute findings. Hepatobiliary: No masses visualized on this unenhanced exam. Diffuse T2 hypointensity consistent with iron deposition. Prior cholecystectomy. Diffuse biliary ductal dilatation is seen with from bile duct measuring up to 20 mm in diameter. Numerous calculi are seen throughout the common bile duct and common hepatic duct, largest measuring 1.8 cm. Degree of ductal dilatation in number of biliary calculi increased since previous study. Pancreas: No mass or inflammatory process visualized on this unenhanced exam. No evidence of pancreatic ductal dilatation. Spleen: Within normal limits in size. Diffuse T2 hypointensity, consistent with iron deposition. Adrenals/Urinary tract: Bilateral  diffuse renal parenchymal atrophy and numerous tiny bilateral renal cysts again seen, likely due to chronic cystic disease of dialysis. No evidence of solid renal mass or hydronephrosis. Stomach/Bowel: Visualized portion unremarkable. Vascular/Lymphatic: No pathologically enlarged lymph nodes identified. No evidence of abdominal aortic aneurysm. Other:  None. Musculoskeletal: No suspicious bone lesions identified. Diffuse bone marrow T2 hypointensity, consistent with iron overload. IMPRESSION: Increased severe  diffuse biliary ductal dilatation, with increase in numerous calculi throughout the common hepatic and bile ducts measuring up to 1.8 cm. Findings of transfusional hemosiderosis. Electronically Signed   By: Marlaine Hind M.D.   On: 03/29/2019 13:26   Medications: . sodium chloride 20 mL (03/29/19 1556)  . cefTRIAXone (ROCEPHIN)  IV 2 g (03/29/19 1558)  . lactated ringers Stopped (03/27/19 1610)   . allopurinol  100 mg Oral Daily  . aspirin EC  325 mg Oral Daily  . Chlorhexidine Gluconate Cloth  6 each Topical Q0600  . cinacalcet  90 mg Oral BID WC  . citalopram  10 mg Oral QHS  . colestipol  3 g Oral Daily  . feeding supplement (NEPRO CARB STEADY)  237 mL Oral BID BM  . gabapentin  600 mg Oral QHS  . heparin  5,000 Units Subcutaneous Q8H  . methimazole  2.5 mg Oral Daily  . midodrine  10 mg Oral TID WC  . pantoprazole  40 mg Oral QHS     Dialysis Orders: AF TTS   4:15 h  101kg  2/2.25 bath  AVF  Hep none no ESA or Fe  hectorol 3 mcg IV TIW Recent labs: hgb 12.9 33% sat Ca/P ok K 5s iPTH 378   Assessment/Plan: 1. Sepsis due to E coli bacteremia.- ^ procalcitonin- on Vanc, Maxipime Echo showed mild calcification of the Ao value - could not r/o small vegetation - suggested TEE to further r/o.  r/o Cdiff, COVID neg.  2. Elevated LFTs suggestive of shock liver AST 347 ALT 416 alk phos 285 but total bili 3.9(last outpt alk phos was 133 12/2018 - no outpatient LFTs since last hospitalization . Repeat labs 6/27  show marked improved in LFTs and decline in T bili to 2.3. Episodes of Kleb pneuo in the past year related to biliary issues - per primary 3. ESRD- TTS - HD off schedule today. Unfortunately unable to cannulate AVF yesterday. No issues with AVF at OP center. HD tomorrow to get back on schedule. Pt aware. K+ 5.4.  4. Hypertension/volume- chronic intra dialytic hypotension during HD treatments even when not ill. CXR neg upon admission post wt 101.8 6/25 ^ EDW for d/c to 102- d/c  profile 72fr more gradual removal of fluid- needs standing wt pre and post - he walks around at home- on midodrine for BP support 5. Anemia- hgb 11.3 > 10.6 stable  - last outpatient hgb 12.9 tsa 33% has not been on ESA or Fe- hold for now- will addd ESA if hgb drops further  6. Metabolic bone disease- Continue VDRA 7. Nutrition- diet + supplements/vitamins  Peniel Biel H. Klaudia Beirne NP-C 03/30/2019, 10:57 AM  [id number] [phone]

## 2019-03-30 NOTE — Progress Notes (Signed)
Pt's wife called about updates. Asking about dialysis, very worried. Gave what update I could, will call back with more. She was concerned that he get his diabetic protein drink, she thinks it is the equivalent of Nepro Carb Steady and says it was on his med list.

## 2019-03-30 NOTE — Progress Notes (Signed)
On arrival patient is on NIV at this time tolerating it well.

## 2019-03-30 NOTE — Consult Note (Signed)
Reason for Consult: Recurrent CBD stones cholangitis Referring Physician: Hospital team  [name] is an 68 y.o. male.  HPI: Patient seen and examined in hospital computer chart reviewed and he looks and sounds way better than his computer chart implies he is not having any GI issues but was admitted with fever and gram-negative sepsis and CT and MRCP were reviewed and concerning for recurrent CBD stones and we discussed and reviewed his previous ERCP and he has no other complaints and is feeling okay now  Past Medical History:  Diagnosis Date  . Acute encephalopathy 10/04/2018  . Allergic rhinitis, cause unspecified 02/24/2014  . Anemia 06/16/2011  . BENIGN PROSTATIC HYPERTROPHY 10/14/2009  . CAD, NATIVE VESSEL 02/06/2009   a. 06/2007 s/p Taxus DES to the RCA;  b. 08/2016 NSTEMI in setting of SVT/PCI: LM 30ost, LAD 40m (3.0x16 Synergy DES), LCX 62m, OM1 60, RI 40, RCA 70p/m, 22m - not amenable to PCI.  [name] Cervical radiculopathy, chronic 02/23/2016   Right c5-6 by NCS/EMG  . Chest pain 08/09/2015  . Chronic combined systolic (congestive) and diastolic (congestive) heart failure ([place])    a. 10/2016 Echo: EF 40-45%, Gr2 DD. mildly dil LA.  [name] COLONIC POLYPS, HX OF 10/14/2009  . Dementia ([place]) [id number]  . Depression 09/24/2015  . DIABETES MELLITUS, TYPE II 02/01/2010  . DYSLIPIDEMIA 06/18/2007  . ESRD (end stage renal disease) on dialysis ([place]) 08/04/2010   ESRD due to DM adn HTN, started HD in 2011. As of 2019 has a R arm graft and gets HD on TTS sched  . FOOT PAIN 08/12/2008  . GAIT DISTURBANCE 03/03/2010  . GASTROENTERITIS, VIRAL 10/14/2009  . GERD 06/18/2007  . GOITER, MULTINODULAR 12/26/2007  . GOUT 06/18/2007  . GYNECOMASTIA 07/17/2010  . Hemodialysis access, fistula mature [facility])    Dialysis T-Th-Sa ([place]) Right upper arm fistula  . Hyperlipidemia 10/16/2011  . Hyperparathyroidism, secondary ([place]) 06/16/2011  . HYPERTENSION 06/18/2007  . Hyperthyroidism   . Hypocalcemia  06/07/2010  . Ischemic cardiomyopathy    a. 10/2016 Echo: EF 40-45%.  . Lumbar stenosis with neurogenic claudication   . ONYCHOMYCOSIS, TOENAILS 12/26/2007  . OSA on CPAP 10/16/2011  . Other malaise and fatigue 11/24/2009  . PERIPHERAL NEUROPATHY 06/18/2007  . Prostate cancer ([place])   . PSVT (paroxysmal supraventricular tachycardia) ([place])    a. 36/6294 complicated by NSTEMI;  b. 11/2016 Treated w/ adenosine in ED;  c. 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  [name] PULMONARY NODULE, RIGHT LOWER LOBE 06/08/2009  . Sleep apnea    cpap machine and o2  . TRANSAMINASES, SERUM, ELEVATED 02/01/2010  . Transfusion history    none recent  . Unspecified hypotension 01/30/2010    Past Surgical History:  Procedure Laterality Date  . A/V SHUNTOGRAM N/A 04/07/2018   Procedure: A/V SHUNTOGRAM;  Surgeon: Waynetta Sandy, MD;  Location: [place] [facility];  Service: Cardiovascular;  Laterality: N/A;  . ARTERIOVENOUS GRAFT PLACEMENT Right 2009   forearm/notes 02/01/2011  . AV FISTULA PLACEMENT  11/07/2011   Procedure: INSERTION OF ARTERIOVENOUS (AV) GORE-TEX GRAFT ARM;  Surgeon: Tinnie Gens, MD;  Location: [place];  Service: Vascular;  Laterality: Left;  . BACK SURGERY  1998  . BALLOON DILATION N/A 11/14/2018   Procedure: BALLOON DILATION;  Surgeon: Ronnette Juniper, MD;  Location: [place];  Service: Gastroenterology;  Laterality: N/A;  . BASCILIC VEIN TRANSPOSITION Right 02/27/2013   Procedure: BASCILIC VEIN TRANSPOSITION;  Surgeon: Mal Misty, MD;  Location: [place];  Service: Vascular;  Laterality: Right;  Right Basilic Vein Transposition   . CARDIAC CATHETERIZATION N/A 08/06/2016   Procedure: Left Heart Cath and Coronary Angiography;  Surgeon: Jolaine Artist, MD;  Location: [place] [facility];  Service: Cardiovascular;  Laterality: N/A;  . CARDIAC CATHETERIZATION N/A 08/07/2016   Procedure: Coronary/Graft Atherectomy-CSI LAD;  Surgeon: Peter M Martinique, MD;  Location: [place] [facility];  Service: Cardiovascular;   Laterality: N/A;  . CERVICAL SPINE SURGERY  2/09   "to repair nerve problems in my left arm"  . CHOLECYSTECTOMY    . COLONOSCOPY WITH PROPOFOL N/A 04/26/2017   Procedure: COLONOSCOPY WITH PROPOFOL;  Surgeon: Otis Brace, MD;  Location: [place];  Service: Gastroenterology;  Laterality: N/A;  . CORONARY ANGIOPLASTY WITH STENT PLACEMENT  06/11/2008  . CORONARY ANGIOPLASTY WITH STENT PLACEMENT  06/2007   TAXUS stent to RCA/notes 01/31/2011  . ERCP N/A 11/14/2018   Procedure: ENDOSCOPIC RETROGRADE CHOLANGIOPANCREATOGRAPHY (ERCP);  Surgeon: Ronnette Juniper, MD;  Location: [place];  Service: Gastroenterology;  Laterality: N/A;  . ESOPHAGOGASTRODUODENOSCOPY  09/28/2011   Procedure: ESOPHAGOGASTRODUODENOSCOPY (EGD);  Surgeon: Missy Sabins, MD;  [facility] ENDOSCOPY;  Service: Endoscopy;  Laterality: N/A;  . ESOPHAGOGASTRODUODENOSCOPY N/A 04/07/2015   Procedure: ESOPHAGOGASTRODUODENOSCOPY (EGD);  Surgeon: Teena Irani, MD;  Location: [facility];  Service: Endoscopy;  Laterality: N/A;  . ESOPHAGOGASTRODUODENOSCOPY N/A 04/19/2015   Procedure: ESOPHAGOGASTRODUODENOSCOPY (EGD);  Surgeon: Arta Silence, MD;  Location: [facility] ENDOSCOPY;  Service: Endoscopy;  Laterality: N/A;  . ESOPHAGOGASTRODUODENOSCOPY  11/14/2018   Procedure: ESOPHAGOGASTRODUODENOSCOPY (EGD);  Surgeon: Ronnette Juniper, MD;  Location: [facility] ENDOSCOPY;  Service: Gastroenterology;;  . ESOPHAGOGASTRODUODENOSCOPY (EGD) WITH PROPOFOL N/A 06/13/2018   Procedure: ESOPHAGOGASTRODUODENOSCOPY (EGD) WITH PROPOFOL;  Surgeon: Wonda Horner, MD;  Location: [facility] ENDOSCOPY;  Service: Endoscopy;  Laterality: N/A;  . FLEXIBLE SIGMOIDOSCOPY N/A 05/21/2017   Procedure: Conni Elliot;  Surgeon: Clarene Essex, MD;  Location: [place];  Service: Endoscopy;  Laterality: N/A;  . FLEXIBLE SIGMOIDOSCOPY Left 07/02/2017   Procedure: FLEXIBLE SIGMOIDOSCOPY;  Surgeon: Laurence Spates, MD;  Location: [place];  Service: Endoscopy;  Laterality: Left;  .  FOREIGN BODY REMOVAL  09/2003   via upper endoscopy/notes 02/12/2011  . GIVENS CAPSULE STUDY  09/30/2011   Procedure: GIVENS CAPSULE STUDY;  Surgeon: Jeryl Columbia, MD;  Location: [facility] ENDOSCOPY;  Service: Endoscopy;  Laterality: N/A;  . INSERTION OF DIALYSIS CATHETER Right 2014  . INSERTION OF DIALYSIS CATHETER Left 02/11/2013   Procedure: INSERTION OF DIALYSIS CATHETER;  Surgeon: Conrad Lyden, MD;  Location: [place];  Service: Vascular;  Laterality: Left;  Ultrasound guided  . IR AV DIALY SHUNT INTRO NEEDLE/INTRACATH INITIAL W/PTA/IMG RIGHT Right 11/14/2018  . IR US GUIDE VASC ACCESS RIGHT  11/14/2018  . LUMBAR LAMINECTOMY/DECOMPRESSION MICRODISCECTOMY Bilateral 07/31/2017   Procedure: LAMINECTOMY AND FORAMINOTOMY- BILATERAL LUMBAR TWO- LUMBAR THREE;  Surgeon: Earnie Larsson, MD;  Location: [place];  Service: Neurosurgery;  Laterality: Bilateral;  LAMINECTOMY AND FORAMINOTOMY- BILATERAL LUMBAR 2- LUMBAR 3  . PERIPHERAL VASCULAR BALLOON ANGIOPLASTY  04/07/2018   Procedure: PERIPHERAL VASCULAR BALLOON ANGIOPLASTY;  Surgeon: Waynetta Sandy, MD;  Location: [place] [facility];  Service: Cardiovascular;;  RUE AVF  . REMOVAL OF A DIALYSIS CATHETER Right 02/11/2013   Procedure: REMOVAL OF A DIALYSIS CATHETER;  Surgeon: Conrad Fruitland, MD;  Location: [place];  Service: Vascular;  Laterality: Right;  . REMOVAL OF STONES  11/14/2018   Procedure: REMOVAL OF STONES;  Surgeon: Ronnette Juniper, MD;  Location: [place];  Service: Gastroenterology;;  .  SAVORY DILATION N/A 04/07/2015   Procedure: SAVORY DILATION;  Surgeon: Teena Irani, MD;  Location: [facility] ENDOSCOPY;  Service: Endoscopy;  Laterality: N/A;  . SHUNTOGRAM N/A 09/20/2011   Procedure: [place];  Surgeon: Conrad Robinson, MD;  Location: [facility] CATH LAB;  Service: Cardiovascular;  Laterality: N/A;  . SPHINCTEROTOMY  11/14/2018   Procedure: SPHINCTEROTOMY;  Surgeon: Ronnette Juniper, MD;  Location: [facility] ENDOSCOPY;  Service: Gastroenterology;;  . SVT ABLATION N/A 11/26/2016    Procedure: SVT Ablation;  Surgeon: Will Meredith Leeds, MD;  Location: [name] [facility];  Service: Cardiovascular;  Laterality: N/A;  . TEE WITHOUT CARDIOVERSION N/A 08/25/2018   Procedure: TRANSESOPHAGEAL ECHOCARDIOGRAM (TEE);  Surgeon: Dorothy Spark, MD;  Location: [facility] ENDOSCOPY;  Service: Cardiovascular;  Laterality: N/A;  . TONSILLECTOMY    . TOTAL KNEE ARTHROPLASTY Right 08/02/2015   Procedure: TOTAL KNEE ARTHROPLASTY;  Surgeon: Renette Butters, MD;  Location: [place];  Service: Orthopedics;  Laterality: Right;  . VENOGRAM N/A 01/26/2013   Procedure: VENOGRAM;  Surgeon: Angelia Mould, MD;  Location: [facility] CATH LAB;  Service: Cardiovascular;  Laterality: N/A;    Family History  Problem Relation Age of Onset  . Heart disease Sister   . Thyroid nodules Sister   . Heart disease Father   . Diabetes Father   . Kidney failure Father   . Hypertension Father   . Healthy Child   . Healthy Child   . Healthy Child   . Cancer Neg Hx     Social History:  reports that he quit smoking about 13 years ago. His smoking use included cigarettes. He has a 25.00 pack-year smoking history. He quit smokeless tobacco use about 13 years ago. He reports that he does not drink alcohol or use drugs.  Allergies:  Allergies  Allergen Reactions  . Statins Other (See Comments)    Weak muscles  . Ciprofloxacin Rash    Medications: I have reviewed the patient's current medications.  Results for orders placed or performed during the hospital encounter of 03/26/19 (from the past 48 hour(s))  CBC     Status: Abnormal   Collection Time: 03/29/19  4:33 AM  Result Value Ref Range   WBC 7.0 4.0 - 10.5 K/uL   RBC 3.38 (L) 4.22 - 5.81 MIL/uL   Hemoglobin 10.9 (L) 13.0 - 17.0 g/dL   HCT 33.7 (L) 39.0 - 52.0 %   MCV 99.7 80.0 - 100.0 fL   MCH 32.2 26.0 - 34.0 pg   MCHC 32.3 30.0 - 36.0 g/dL   RDW 16.5 (H) 11.5 - 15.5 %   Platelets 190 150 - 400 K/uL   nRBC 0.0 0.0 - 0.2 %    Comment: Performed at  [facility], [place] [address], [place], [place] [address]  Basic metabolic panel     Status: Abnormal   Collection Time: 03/29/19  4:33 AM  Result Value Ref Range   Sodium 138 135 - 145 mmol/L   Potassium 5.9 (H) 3.5 - 5.1 mmol/L    Comment: DELTA CHECK NOTED   Chloride 105 98 - 111 mmol/L   CO2 19 (L) 22 - 32 mmol/L   Glucose, Bld 69 (L) 70 - 99 mg/dL   BUN 66 (H) 8 - 23 mg/dL   Creatinine, Ser 11.67 (H) 0.61 - 1.24 mg/dL   Calcium 8.6 (L) 8.9 - 10.3 mg/dL   GFR calc non Af Amer 4 (L) >60 mL/min   GFR calc Af Amer 5 (L) >60 mL/min  Anion gap 14 5 - 15    Comment: Performed at [place] [address] [place], [place] [address]  CBC     Status: Abnormal   Collection Time: 03/30/19  3:53 AM  Result Value Ref Range   WBC 6.9 4.0 - 10.5 K/uL   RBC 3.32 (L) 4.22 - 5.81 MIL/uL   Hemoglobin 10.6 (L) 13.0 - 17.0 g/dL   HCT 33.2 (L) 39.0 - 52.0 %   MCV 100.0 80.0 - 100.0 fL   MCH 31.9 26.0 - 34.0 pg   MCHC 31.9 30.0 - 36.0 g/dL   RDW 16.3 (H) 11.5 - 15.5 %   Platelets 203 150 - 400 K/uL   nRBC 0.0 0.0 - 0.2 %    Comment: Performed at [facility], [place] [address], [place], [place] [address]  Comprehensive metabolic panel     Status: Abnormal   Collection Time: 03/30/19  3:53 AM  Result Value Ref Range   Sodium 140 135 - 145 mmol/L   Potassium 5.4 (H) 3.5 - 5.1 mmol/L   Chloride 103 98 - 111 mmol/L   CO2 20 (L) 22 - 32 mmol/L   Glucose, Bld 72 70 - 99 mg/dL   BUN 66 (H) 8 - 23 mg/dL   Creatinine, Ser 12.38 (H) 0.61 - 1.24 mg/dL   Calcium 8.0 (L) 8.9 - 10.3 mg/dL   Total Protein 5.9 (L) 6.5 - 8.1 g/dL   Albumin 2.7 (L) 3.5 - 5.0 g/dL   AST 42 (H) 15 - 41 U/L   ALT 102 (H) 0 - 44 U/L   Alkaline Phosphatase 206 (H) 38 - 126 U/L   Total Bilirubin 1.0 0.3 - 1.2 mg/dL   GFR calc non Af Amer 4 (L) >60 mL/min   GFR calc Af Amer 4 (L) >60 mL/min   Anion gap 17 (H) 5 - 15    Comment: Performed at [facility], [place] [address], [place], [place] [address]    *Note: Due to a large number of results and/or encounters for the requested time period, some results have not been displayed. A complete set of results can be found in Results Review.    Mr Abdomen Mrcp Wo Contrast  Result Date: 03/29/2019 CLINICAL DATA:  Choledocholithiasis. Bacteremia. Status post sphincterotomy approximately 4 months ago. Renal insufficiency. EXAM: MRI ABDOMEN WITHOUT CONTRAST  (INCLUDING MRCP) TECHNIQUE: Multiplanar multisequence MR imaging of the abdomen was performed. Heavily T2-weighted images of the biliary and pancreatic ducts were obtained, and three-dimensional MRCP images were rendered by post processing. COMPARISON:  11/13/2018 FINDINGS: Lower chest: No acute findings. Hepatobiliary: No masses visualized on this unenhanced exam. Diffuse T2 hypointensity consistent with iron deposition. Prior cholecystectomy. Diffuse biliary ductal dilatation is seen with from bile duct measuring up to 20 mm in diameter. Numerous calculi are seen throughout the common bile duct and common hepatic duct, largest measuring 1.8 cm. Degree of ductal dilatation in number of biliary calculi increased since previous study. Pancreas: No mass or inflammatory process visualized on this unenhanced exam. No evidence of pancreatic ductal dilatation. Spleen: Within normal limits in size. Diffuse T2 hypointensity, consistent with iron deposition. 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 No evidence of solid renal mass or hydronephrosis. Stomach/Bowel: Visualized portion unremarkable. Vascular/Lymphatic: No pathologically enlarged lymph nodes identified. No evidence of abdominal aortic aneurysm. Other:  None. Musculoskeletal: No suspicious bone lesions  identified. Diffuse bone marrow T2 hypointensity, consistent with iron overload. IMPRESSION: Increased severe diffuse biliary ductal  dilatation, with increase in numerous calculi throughout the common hepatic and bile ducts measuring up to 1.8 cm. Findings of transfusional hemosiderosis. Electronically Signed   By: Marlaine Hind M.D.   On: 03/29/2019 13:26    ROS negative except above Blood pressure (!) 124/49, pulse 65, temperature 98.1 F (36.7 C), temperature source Oral, resp. rate 14, height 6\' 1"  (1.854 m), weight 106.7 kg, SpO2 98 %. Physical Exam vital signs stable afebrile no acute distress exam pertinent for his abdomen being soft nontender occasional bowel sounds labs and MRCP reviewed LFTs decreased white count okay hemoglobin stable  Assessment/Plan: Recurrent CBD stones and cholangitis Plan: We rediscussed ERCP including the risks and will proceed tomorrow with further work-up and plans pending those findings  Accident E 03/30/2019, 11:15 AM

## 2019-03-30 NOTE — Progress Notes (Signed)
Renal Navigator notified OP HD clinic/SW of patient's admission and negative COVID 19 test result to provide continuity of care.   Alphonzo Cruise, [place] Renal Navigator [phone]

## 2019-03-30 NOTE — Progress Notes (Signed)
Pt. States he can place himself on cpap when ready. RT informed pt. To notify if he needed any assistance.

## 2019-03-30 NOTE — Progress Notes (Signed)
PROGRESS NOTE    [name]  FIE:[id number] DOB: [birth year] DOA: 03/26/2019 PCP: Biagio Borg, MD   Brief Narrative:  HPI on 03/27/2019 by Dr. Aurea Graff 68 yo male esrd on hd TTS, dementia , multiple admissions for sepsis and bacteremia, has AVF on right arm. He was brought to the ED by family after he returned from HD feeling weak and they found his BP was SBP 70'S. Wife told the ED staff that she thinks they took too much fluid off during HD and that this has happened before.  In the Ed during my evaluation the patient is alert and oriented but has dementia and is a poor historian. His SBP is in the 70's but he is awake and talking , denies any chest pain , sob , n/v, he just had two BM's no diarrhea. Had low grade fever on arrival. Received cefepime and vancomycin as well as 500 cc of fluids.   Interim history Admitted for sepsis secondary to bacteremia. On IV antibiotics. MRCP obtained showing stones. GI consulted for ERCP. Assessment & Plan   Severe sepsis secondary to E. coli bacteremia -On admission, patient was noted to have hypothermia, tachycardia, tachypnea, elevated LFTs -Blood cultures positive for gram-negative rods, E. Coli -Continue ceftriaxone -Upon review of patient's chart, he has also had Klebsiella pneumonia bacteremia in the past which was due to biliary issues.  In January 2020, patient did have to have ERCP as well as MRCP and sphincterotomy -Echocardiogram showed an EF of 55 to 16%, LV diastolic Doppler parameters consistent with impaired relaxation.  Calcification of the aortic valve.  Cannot rule out small vegetation on the aortic valve but not definite, would suggest TEE for further assessment. -MRCP ordered, Increased severe diffuse biliary ductal dilatation, with increase in numerous calculi throughout the common hepatic and bile ducts measuring up to 1.8 cm. -Gastroenterology consulted and appreciated. Planning for ERCP  Elevated LFTs -With a secondary  to the above, shock liver, hypotension -LFTs are improving, continue to monitor CMP  ESRD with hyperkalemia -Dialyzes Tuesday, Thursday, Saturday -Nephrology consulted and appreciated -patient did not dialyze on Saturday  Diarrhea -C diff negative -Continue Imodium  Anemia of chronic disease -hemoglobin stable -Continue to monitor CBC  Hyperthyroidism -Continue Tapazole  Chronic hypotension -Continue Midodrine.  Patient tells me he has not been on Midodrine in quite some time.  Depression -Continue Celexa  Neuropathy/chronic pain -Continue gabapentin, hydrocodone and morphine PRN -pain better controlled today   DVT Prophylaxis  SCDs  Code Status: Full  Family Communication: None at bedside  Disposition Plan: Admitted. Pending workup and improvement.  Consultants Nephrology  Gastroenterology  Procedures  Echocardiogram MRCP  Antibiotics   Anti-infectives (From admission, onward)   Start     Dose/Rate Route Frequency Ordered Stop   03/27/19 2300  ceFEPIme (MAXIPIME) 1 g in sodium chloride 0.9 % 100 mL IVPB  Status:  Discontinued     1 g 200 mL/hr over 30 Minutes Intravenous Every 24 hours 03/26/19 2351 03/27/19 1638   03/27/19 1645  cefTRIAXone (ROCEPHIN) 2 g in sodium chloride 0.9 % 100 mL IVPB     2 g 200 mL/hr over 30 Minutes Intravenous Every 24 hours 03/27/19 1638     03/26/19 2350  vancomycin variable dose per unstable renal function (pharmacist dosing)  Status:  Discontinued      Does not apply See admin instructions 03/26/19 2351 03/27/19 1000   03/26/19 2345  ceFEPIme (MAXIPIME) 2 g in sodium chloride 0.9 %  100 mL IVPB     2 g 200 mL/hr over 30 Minutes Intravenous  Once 03/26/19 2337 03/27/19 0038   03/26/19 2345  metroNIDAZOLE (FLAGYL) IVPB 500 mg     500 mg 100 mL/hr over 60 Minutes Intravenous  Once 03/26/19 2337 03/27/19 0149   03/26/19 2345  vancomycin (VANCOCIN) IVPB 1000 mg/200 mL premix  Status:  Discontinued     1,000 mg 200 mL/hr over  60 Minutes Intravenous  Once 03/26/19 2337 03/26/19 2339   03/26/19 2345  vancomycin (VANCOCIN) 2,000 mg in sodium chloride 0.9 % 500 mL IVPB     2,000 mg 250 mL/hr over 120 Minutes Intravenous  Once 03/26/19 2339 03/27/19 4270      Subjective:   [name] seen and examined today.  His pain is better controlled today.  Denies current chest pain, shortness of breath, abdominal pain, nausea or vomiting, constipation.  Feels diarrhea is also improved.  Objective:   Vitals:   03/29/19 2154 03/30/19 0032 03/30/19 0511 03/30/19 0941  BP: 140/66 (!) 142/55 (!) 133/55 (!) 124/49  Pulse: 61 66 64 65  Resp:  11 14   Temp:  (!) 97.4 F (36.3 C) 98.1 F (36.7 C)   TempSrc:  Oral Oral   SpO2: 99% 100% 100% 98%  Weight:      Height:        Intake/Output Summary (Last 24 hours) at 03/30/2019 1211 Last data filed at 03/29/2019 2015 Gross per 24 hour  Intake 240 ml  Output -28 ml  Net 268 ml   Filed Weights   03/27/19 0430 03/28/19 0456 03/29/19 1925  Weight: 102.5 kg 103.6 kg 106.7 kg   Exam  General: Well developed, chronically ill appearing, NAD  HEENT: NCAT, mucous membranes moist.   Cardiovascular: S1 S2 auscultated, RRR  Respiratory: Clear to auscultation bilaterally  Abdomen: Soft, nontender, nondistended, + bowel sounds  Extremities: warm dry without cyanosis clubbing or edema  Neuro: AAOx3, nonfocal  Psych: Appropriate mood and affect, pleasant  Data Reviewed: I have personally reviewed following labs and imaging studies  CBC: Recent Labs  Lab 03/26/19 2230 03/27/19 1002 03/28/19 0323 03/29/19 0433 03/30/19 0353  WBC 9.5 8.4 6.7 7.0 6.9  NEUTROABS 7.2  --   --   --   --   HGB 12.5* 11.3* 10.8* 10.9* 10.6*  HCT 39.1 36.0* 33.7* 33.7* 33.2*  MCV 100.8* 100.6* 99.7 99.7 100.0  PLT 197 199 193 190 623   Basic Metabolic Panel: Recent Labs  Lab 03/26/19 2230 03/27/19 0039 03/27/19 1002 03/28/19 0323 03/29/19 0433 03/30/19 0353  NA 135  --  139 139  138 140  K 6.2* 5.2* 4.8 4.8 5.9* 5.4*  CL 96*  --  104 104 105 103  CO2 24  --  24 23 19* 20*  GLUCOSE 143*  --  102* 99 69* 72  BUN 38*  --  44* 53* 66* 66*  CREATININE 7.21*  --  7.77* 9.68* 11.67* 12.38*  CALCIUM 8.8*  --  8.5* 8.6* 8.6* 8.0*  MG  --   --  1.9  --   --   --   PHOS  --   --  3.2  --   --   --    GFR: Estimated Creatinine Clearance: 7.1 mL/min (A) (by C-G formula based on SCr of 12.38 mg/dL (H)). Liver Function Tests: Recent Labs  Lab 03/26/19 2230 03/28/19 0323 03/30/19 0353  AST 347* 86* 42*  ALT 416* 197* 102*  ALKPHOS 286* 200* 206*  BILITOT 3.9* 2.3* 1.0  PROT 7.0 6.4* 5.9*  ALBUMIN 3.2* 2.8* 2.7*   No results for input(s): LIPASE, AMYLASE in the last 168 hours. No results for input(s): AMMONIA in the last 168 hours. Coagulation Profile: No results for input(s): INR, PROTIME in the last 168 hours. Cardiac Enzymes: No results for input(s): CKTOTAL, CKMB, CKMBINDEX, TROPONINI in the last 168 hours. BNP (last 3 results) No results for input(s): PROBNP in the last 8760 hours. HbA1C: No results for input(s): HGBA1C in the last 72 hours. CBG: Recent Labs  Lab 03/27/19 0501  GLUCAP 94   Lipid Profile: No results for input(s): CHOL, HDL, LDLCALC, TRIG, CHOLHDL, LDLDIRECT in the last 72 hours. Thyroid Function Tests: No results for input(s): TSH, T4TOTAL, FREET4, T3FREE, THYROIDAB in the last 72 hours. Anemia Panel: No results for input(s): VITAMINB12, FOLATE, FERRITIN, TIBC, IRON, RETICCTPCT in the last 72 hours. Urine analysis:    Component Value Date/Time   COLORURINE LT. YELLOW 06/26/2013 1200   APPEARANCEUR CLEAR 06/26/2013 1200   LABSPEC 1.020 06/26/2013 1200   PHURINE 8.5 06/26/2013 1200   GLUCOSEU 100 06/26/2013 1200   HGBUR MODERATE 06/26/2013 1200   BILIRUBINUR NEGATIVE 06/26/2013 1200   KETONESUR NEGATIVE 06/26/2013 1200   PROTEINUR >300 (A) 01/04/2010 1032   UROBILINOGEN 0.2 06/26/2013 1200   NITRITE NEGATIVE 06/26/2013 1200    LEUKOCYTESUR TRACE 06/26/2013 1200   Sepsis Labs: @LABRCNTIP (procalcitonin:4,lacticidven:4)  ) Recent Results (from the past 240 hour(s))  Culture, blood (routine x 2)     Status: None (Preliminary result)   Collection Time: 03/26/19 10:35 PM   Specimen: BLOOD  Result Value Ref Range Status   Specimen Description BLOOD LEFT ANTECUBITAL  Final   Special Requests   Final    BOTTLES DRAWN AEROBIC AND ANAEROBIC Blood Culture results may not be optimal due to an inadequate volume of blood received in culture bottles   Culture  Setup Time   Final    GRAM NEGATIVE RODS ANAEROBIC BOTTLE ONLY CRITICAL VALUE NOTED.  VALUE IS CONSISTENT WITH PREVIOUSLY REPORTED AND CALLED VALUE.    Culture   Final    GRAM NEGATIVE RODS IDENTIFICATION TO FOLLOW Performed at [facility], [place] [address] [place], [place] [address]    Report Status PENDING  Incomplete  Culture, blood (routine x 2)     Status: Abnormal   Collection Time: 03/26/19 10:45 PM   Specimen: BLOOD LEFT HAND  Result Value Ref Range Status   Specimen Description BLOOD LEFT HAND  Final   Special Requests   Final    BOTTLES DRAWN AEROBIC AND ANAEROBIC Blood Culture results may not be optimal due to an inadequate volume of blood received in culture bottles   Culture  Setup Time   Final    GRAM NEGATIVE RODS IN BOTH AEROBIC AND ANAEROBIC BOTTLES Organism ID to follow CRITICAL RESULT CALLED TO, READ BACK BY AND VERIFIED WITH: Hughie Closs PharmD 16:05 03/27/19 ([email or web address]) Performed at [facility], [place] [address], [place], [place] [address]    Culture ESCHERICHIA COLI (A)  Final   Report Status 03/29/2019 FINAL  Final   Organism ID, Bacteria ESCHERICHIA COLI  Final      Susceptibility   Escherichia coli - MIC*    AMPICILLIN <=2 SENSITIVE Sensitive     CEFAZOLIN <=4 SENSITIVE Sensitive     CEFEPIME <=1 SENSITIVE Sensitive     CEFTAZIDIME <=1 SENSITIVE Sensitive     CEFTRIAXONE <=1 SENSITIVE Sensitive  CIPROFLOXACIN <=0.25  SENSITIVE Sensitive     GENTAMICIN <=1 SENSITIVE Sensitive     IMIPENEM <=0.25 SENSITIVE Sensitive     TRIMETH/SULFA <=20 SENSITIVE Sensitive     AMPICILLIN/SULBACTAM <=2 SENSITIVE Sensitive     PIP/TAZO <=4 SENSITIVE Sensitive     Extended ESBL NEGATIVE Sensitive     * ESCHERICHIA COLI  Blood Culture ID Panel (Reflexed)     Status: Abnormal   Collection Time: 03/26/19 10:45 PM  Result Value Ref Range Status   Enterococcus species NOT DETECTED NOT DETECTED Final   Listeria monocytogenes NOT DETECTED NOT DETECTED Final   Staphylococcus species NOT DETECTED NOT DETECTED Final   Staphylococcus aureus (BCID) NOT DETECTED NOT DETECTED Final   Streptococcus species NOT DETECTED NOT DETECTED Final   Streptococcus agalactiae NOT DETECTED NOT DETECTED Final   Streptococcus pneumoniae NOT DETECTED NOT DETECTED Final   Streptococcus pyogenes NOT DETECTED NOT DETECTED Final   Acinetobacter baumannii NOT DETECTED NOT DETECTED Final   Enterobacteriaceae species DETECTED (A) NOT DETECTED Final    Comment: Enterobacteriaceae represent a large family of gram-negative bacteria, not a single organism. CRITICAL RESULT CALLED TO, READ BACK BY AND VERIFIED WITH: Hughie Closs PharmD 16:00 03/27/19 ([email or web address])    Enterobacter cloacae complex NOT DETECTED NOT DETECTED Final   Escherichia coli DETECTED (A) NOT DETECTED Final    Comment: CRITICAL RESULT CALLED TO, READ BACK BY AND VERIFIED WITH: Hughie Closs PharmD 16:00 03/27/19 ([email or web address])    Klebsiella oxytoca NOT DETECTED NOT DETECTED Final   Klebsiella pneumoniae NOT DETECTED NOT DETECTED Final   Proteus species NOT DETECTED NOT DETECTED Final   Serratia marcescens NOT DETECTED NOT DETECTED Final   Carbapenem resistance NOT DETECTED NOT DETECTED Final   Haemophilus influenzae NOT DETECTED NOT DETECTED Final   Neisseria meningitidis NOT DETECTED NOT DETECTED Final   Pseudomonas aeruginosa NOT DETECTED NOT DETECTED Final   Candida albicans NOT DETECTED NOT  DETECTED Final   Candida glabrata NOT DETECTED NOT DETECTED Final   Candida krusei NOT DETECTED NOT DETECTED Final   Candida parapsilosis NOT DETECTED NOT DETECTED Final   Candida tropicalis NOT DETECTED NOT DETECTED Final    Comment: Performed at [facility], [place] [address], [place], [place] [address]  SARS Coronavirus 2 (CEPHEID - Performed in [place] hospital lab), Hosp Order     Status: None   Collection Time: 03/26/19 11:25 PM   Specimen: Nasopharyngeal Swab  Result Value Ref Range Status   SARS Coronavirus 2 NEGATIVE NEGATIVE Final    Comment: (NOTE) If result is NEGATIVE SARS-CoV-2 target nucleic acids are NOT DETECTED. The SARS-CoV-2 RNA is generally detectable in upper and lower  respiratory specimens during the acute phase of infection. The lowest  concentration of SARS-CoV-2 viral copies this assay can detect is 250  copies / mL. A negative result does not preclude SARS-CoV-2 infection  and should not be used as the sole basis for treatment or other  patient management decisions.  A negative result may occur with  improper specimen collection / handling, submission of specimen other  than nasopharyngeal swab, presence of viral mutation(s) within the  areas targeted by this assay, and inadequate number of viral copies  (<250 copies / mL). A negative result must be combined with clinical  observations, patient history, and epidemiological information. If result is POSITIVE SARS-CoV-2 target nucleic acids are DETECTED. The SARS-CoV-2 RNA is generally detectable in upper and lower  respiratory specimens dur ing the acute phase of infection.  Positive  results are indicative of active infection with SARS-CoV-2.  Clinical  correlation with patient history and other diagnostic information is  necessary to determine patient infection status.  Positive results do  not rule out bacterial infection or co-infection with other viruses. If result is PRESUMPTIVE  POSTIVE SARS-CoV-2 nucleic acids MAY BE PRESENT.   A presumptive positive result was obtained on the submitted specimen  and confirmed on repeat testing.  While 2019 novel coronavirus  (SARS-CoV-2) nucleic acids may be present in the submitted sample  additional confirmatory testing may be necessary for epidemiological  and / or clinical management purposes  to differentiate between  SARS-CoV-2 and other Sarbecovirus currently known to infect humans.  If clinically indicated additional testing with an alternate test  methodology [phone]) is advised. The SARS-CoV-2 RNA is generally  detectable in upper and lower respiratory sp ecimens during the acute  phase of infection. The expected result is Negative. Fact Sheet for Patients:  StrictlyIdeas.no Fact Sheet for Healthcare Providers: BankingDealers.co.za This test is not yet approved or cleared by the [place] FDA and has been authorized for detection and/or diagnosis of SARS-CoV-2 by FDA under an Emergency Use Authorization (EUA).  This EUA will remain in effect (meaning this test can be used) for the duration of the COVID-19 declaration under Section 564(b)(1) of the Act, 21 U.S.C. section 360bbb-3(b)(1), unless the authorization is terminated or revoked sooner. Performed at [facility], [place] [address] [place], [place] [address]   MRSA PCR Screening     Status: None   Collection Time: 03/27/19  5:03 AM   Specimen: Nasal Mucosa; Nasopharyngeal  Result Value Ref Range Status   MRSA by PCR NEGATIVE NEGATIVE Final    Comment:        The GeneXpert MRSA Assay (FDA approved for NASAL specimens only), is one component of a comprehensive MRSA colonization surveillance program. It is not intended to diagnose MRSA infection nor to guide or monitor treatment for MRSA infections. Performed at [facility], [place] [address] [place], [place] [address]   C difficile quick  scan w PCR reflex     Status: None   Collection Time: 03/27/19  9:32 AM   Specimen: STOOL  Result Value Ref Range Status   C Diff antigen NEGATIVE NEGATIVE Final   C Diff toxin NEGATIVE NEGATIVE Final   C Diff interpretation No C. difficile detected.  Final    Comment: Performed at [facility], [place] [address] [place], [place] [address]      Radiology Studies: Mr Abdomen Mrcp Wo Contrast  Result Date: 03/29/2019 CLINICAL DATA:  Choledocholithiasis. Bacteremia. Status post sphincterotomy approximately 4 months ago. Renal insufficiency. EXAM: MRI ABDOMEN WITHOUT CONTRAST  (INCLUDING MRCP) TECHNIQUE: Multiplanar multisequence MR imaging of the abdomen was performed. Heavily T2-weighted images of the biliary and pancreatic ducts were obtained, and three-dimensional MRCP images were rendered by post processing. COMPARISON:  11/13/2018 FINDINGS: Lower chest: No acute findings. Hepatobiliary: No masses visualized on this unenhanced exam. Diffuse T2 hypointensity consistent with iron deposition. Prior cholecystectomy. Diffuse biliary ductal dilatation is seen with from bile duct measuring up to 20 mm in diameter. Numerous calculi are seen throughout the common bile duct and common hepatic duct, largest measuring 1.8 cm. Degree of ductal dilatation in number of biliary calculi increased since previous study. Pancreas: No mass or inflammatory process visualized on this unenhanced exam. No evidence of pancreatic ductal dilatation. Spleen: Within normal limits in size. Diffuse T2 hypointensity, consistent with  iron deposition. 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 No evidence of solid renal mass or hydronephrosis. Stomach/Bowel: Visualized portion unremarkable. Vascular/Lymphatic: No pathologically enlarged lymph nodes identified. No evidence of abdominal aortic aneurysm.  Other:  None. Musculoskeletal: No suspicious bone lesions identified. Diffuse bone marrow T2 hypointensity, consistent with iron overload. IMPRESSION: Increased severe diffuse biliary ductal dilatation, with increase in numerous calculi throughout the common hepatic and bile ducts measuring up to 1.8 cm. Findings of transfusional hemosiderosis. Electronically Signed   By: Marlaine Hind M.D.   On: 03/29/2019 13:26     Scheduled Meds:  allopurinol  100 mg Oral Daily   aspirin EC  325 mg Oral Daily   Chlorhexidine Gluconate Cloth  6 each Topical Q0600   cinacalcet  90 mg Oral BID WC   citalopram  10 mg Oral QHS   colestipol  3 g Oral Daily   feeding supplement (NEPRO CARB STEADY)  237 mL Oral BID BM   gabapentin  600 mg Oral QHS   heparin  5,000 Units Subcutaneous Q8H   methimazole  2.5 mg Oral Daily   midodrine  10 mg Oral TID WC   pantoprazole  40 mg Oral QHS   Continuous Infusions:  sodium chloride 20 mL (03/29/19 1556)   cefTRIAXone (ROCEPHIN)  IV 2 g (03/29/19 1558)   lactated ringers Stopped (03/27/19 1610)     LOS: 3 days   Time Spent in minutes   45 minutes  Anabella Capshaw D.O. on 03/30/2019 at 12:11 PM  Between 7am to 7pm - Please see pager noted on [email or web address]  After 7pm go to [email or web address]  And look for the night coverage person covering for me after hours  [facility] Office  [phone]

## 2019-03-30 NOTE — Progress Notes (Signed)
Received report from [facility] [place] [facility] in HD at 2107. Melisa stated only able to run patient's HD for approximately 30 minutes due to difficulty with access and clots. Indicated patient may need a fistulogram and MD was made aware.   Upon return from HD patient was frustrated with issues that occurred during HD. Informed patient MD aware and will follow up and will determine need to assess/evaluate fistula.

## 2019-03-31 ENCOUNTER — Encounter (HOSPITAL_COMMUNITY): Payer: Self-pay | Admitting: *Deleted

## 2019-03-31 ENCOUNTER — Inpatient Hospital Stay (HOSPITAL_COMMUNITY): Payer: Medicare Other | Admitting: Anesthesiology

## 2019-03-31 ENCOUNTER — Encounter (HOSPITAL_COMMUNITY)
Admission: EM | Disposition: A | Discharge status: Home / Self Care | Payer: Self-pay | Source: Home / Self Care | Attending: Internal Medicine

## 2019-03-31 ENCOUNTER — Inpatient Hospital Stay (HOSPITAL_COMMUNITY): Payer: Medicare Other

## 2019-03-31 DIAGNOSIS — G8929 Other chronic pain: Secondary | ICD-10-CM

## 2019-03-31 DIAGNOSIS — N186 End stage renal disease: Secondary | ICD-10-CM

## 2019-03-31 HISTORY — PX: SPHINCTEROTOMY: SHX5279

## 2019-03-31 HISTORY — PX: BILIARY DILATION: SHX6850

## 2019-03-31 HISTORY — PX: ENDOSCOPIC RETROGRADE CHOLANGIOPANCREATOGRAPHY (ERCP) WITH PROPOFOL: SHX5810

## 2019-03-31 HISTORY — PX: REMOVAL OF STONES: SHX5545

## 2019-03-31 HISTORY — PX: BILIARY STENT PLACEMENT: SHX5538

## 2019-03-31 LAB — HEPATITIS B SURFACE ANTIGEN: Hepatitis B Surface Ag: NEGATIVE

## 2019-03-31 LAB — CBC
HCT: 31 % — ABNORMAL LOW (ref 39.0–52.0)
Hemoglobin: 10 g/dL — ABNORMAL LOW (ref 13.0–17.0)
MCH: 32.1 pg (ref 26.0–34.0)
MCHC: 32.3 g/dL (ref 30.0–36.0)
MCV: 99.4 fL (ref 80.0–100.0)
Platelets: 199 10*3/uL (ref 150–400)
RBC: 3.12 MIL/uL — ABNORMAL LOW (ref 4.22–5.81)
RDW: 16.1 % — ABNORMAL HIGH (ref 11.5–15.5)
WBC: 7.2 10*3/uL (ref 4.0–10.5)
nRBC: 0 % (ref 0.0–0.2)

## 2019-03-31 LAB — COMPREHENSIVE METABOLIC PANEL
ALT: 90 U/L — ABNORMAL HIGH (ref 0–44)
AST: 48 U/L — ABNORMAL HIGH (ref 15–41)
Albumin: 2.8 g/dL — ABNORMAL LOW (ref 3.5–5.0)
Alkaline Phosphatase: 253 U/L — ABNORMAL HIGH (ref 38–126)
Anion gap: 15 (ref 5–15)
BUN: 37 mg/dL — ABNORMAL HIGH (ref 8–23)
CO2: 24 mmol/L (ref 22–32)
Calcium: 8 mg/dL — ABNORMAL LOW (ref 8.9–10.3)
Chloride: 100 mmol/L (ref 98–111)
Creatinine, Ser: 9.35 mg/dL — ABNORMAL HIGH (ref 0.61–1.24)
GFR calc Af Amer: 6 mL/min — ABNORMAL LOW (ref >60)
GFR calc non Af Amer: 5 mL/min — ABNORMAL LOW (ref >60)
Glucose, Bld: 114 mg/dL — ABNORMAL HIGH (ref 70–99)
Potassium: 4.5 mmol/L (ref 3.5–5.1)
Sodium: 139 mmol/L (ref 135–145)
Total Bilirubin: 1.1 mg/dL (ref 0.3–1.2)
Total Protein: 6.6 g/dL (ref 6.5–8.1)

## 2019-03-31 SURGERY — ENDOSCOPIC RETROGRADE CHOLANGIOPANCREATOGRAPHY (ERCP) WITH PROPOFOL
Anesthesia: General

## 2019-03-31 MED ORDER — SODIUM CHLORIDE 0.9 % IV SOLN
INTRAVENOUS | Status: DC | PRN
  Administered 2019-03-31: 12:00:00 50 ug/min via INTRAVENOUS

## 2019-03-31 MED ORDER — EPHEDRINE SULFATE-NACL 50-0.9 MG/10ML-% IV SOSY
PREFILLED_SYRINGE | INTRAVENOUS | Status: DC | PRN
  Administered 2019-03-31: 10 mg via INTRAVENOUS

## 2019-03-31 MED ORDER — CIPROFLOXACIN IN D5W 400 MG/200ML IV SOLN
INTRAVENOUS | Status: AC
  Filled 2019-03-31: qty 200

## 2019-03-31 MED ORDER — LIDOCAINE 2% (20 MG/ML) 5 ML SYRINGE
INTRAMUSCULAR | Status: DC | PRN
  Administered 2019-03-31: 100 mg via INTRAVENOUS

## 2019-03-31 MED ORDER — ONDANSETRON HCL 4 MG/2ML IJ SOLN
INTRAMUSCULAR | Status: DC | PRN
  Administered 2019-03-31: 4 mg via INTRAVENOUS

## 2019-03-31 MED ORDER — INDOMETHACIN 50 MG RE SUPP
RECTAL | Status: AC
  Filled 2019-03-31: qty 2

## 2019-03-31 MED ORDER — GLUCAGON HCL RDNA (DIAGNOSTIC) 1 MG IJ SOLR
INTRAMUSCULAR | Status: AC
  Filled 2019-03-31: qty 1

## 2019-03-31 MED ORDER — SUCCINYLCHOLINE CHLORIDE 200 MG/10ML IV SOSY
PREFILLED_SYRINGE | INTRAVENOUS | Status: DC | PRN
  Administered 2019-03-31: 120 mg via INTRAVENOUS

## 2019-03-31 MED ORDER — PROPOFOL 10 MG/ML IV BOLUS
INTRAVENOUS | Status: DC | PRN
  Administered 2019-03-31: 80 mg via INTRAVENOUS

## 2019-03-31 MED ORDER — DEXAMETHASONE SODIUM PHOSPHATE 10 MG/ML IJ SOLN
INTRAMUSCULAR | Status: DC | PRN
  Administered 2019-03-31: 5 mg via INTRAVENOUS

## 2019-03-31 MED ORDER — IOHEXOL 180 MG/ML  SOLN
INTRAMUSCULAR | Status: DC | PRN
  Administered 2019-03-31: 13:00:00 50 mL via INTRAVENOUS

## 2019-03-31 MED ORDER — FENTANYL CITRATE (PF) 250 MCG/5ML IJ SOLN
INTRAMUSCULAR | Status: DC | PRN
  Administered 2019-03-31: 100 ug via INTRAVENOUS

## 2019-03-31 MED ORDER — MIDODRINE HCL 5 MG PO TABS
ORAL_TABLET | ORAL | Status: AC
  Administered 2019-03-31: 10 mg via ORAL
  Filled 2019-03-31: qty 2

## 2019-03-31 NOTE — Transfer of Care (Signed)
Immediate Anesthesia Transfer of Care Note  Patient: [name]  Procedure(s) Performed: ENDOSCOPIC RETROGRADE CHOLANGIOPANCREATOGRAPHY (ERCP) WITH PROPOFOL (N/A ) SPHINCTEROTOMY REMOVAL OF STONES BILIARY STENT PLACEMENT BILIARY DILATION  Patient Location: Endoscopy Unit  Anesthesia Type:General  Level of Consciousness: awake, alert  and oriented  Airway & Oxygen Therapy: Patient Spontanous Breathing and Patient connected to face mask oxygen  Post-op Assessment: Report given to RN and Post -op Vital signs reviewed and stable  Post vital signs: Reviewed and stable  Last Vitals:  Vitals Value Taken Time  BP 150/60 03/31/19 1329  Temp 36.2 C 03/31/19 1329  Pulse 69 03/31/19 1331  Resp 16 03/31/19 1331  SpO2 100 % 03/31/19 1331  Vitals shown include unvalidated device data.  Last Pain:  Vitals:   03/31/19 1329  TempSrc: Temporal  PainSc:       Patients Stated Pain Goal: 0 (62/69/48 5462)  Complications: No apparent anesthesia complications

## 2019-03-31 NOTE — Progress Notes (Signed)
[name] 11:07 AM  Subjective: The patient is asymptomatic from a GI standpoint and no fever or other complaints  Objective: Signs stable afebrile no acute distress exam please see preassessment evaluation labs stable  Assessment: Recurrent CBD stones  Plan: Okay to proceed with ERCP with anesthesia assistance  [facility]  office [phone] After 5PM or if no answer call [phone]

## 2019-03-31 NOTE — Anesthesia Preprocedure Evaluation (Addendum)
Anesthesia Evaluation  Patient identified by MRN, date of birth, ID band Patient awake    Reviewed: Allergy & Precautions, H&P , NPO status , Patient's Chart, lab work & pertinent test results  Airway Mallampati: II  TM Distance: >3 FB Neck ROM: Full    Dental  (+) Dental Advisory Given, Missing, Poor Dentition, Chipped,    Pulmonary sleep apnea and Continuous Positive Airway Pressure Ventilation , former smoker,    Pulmonary exam normal breath sounds clear to auscultation       Cardiovascular hypertension, + CAD, + Past MI, + Cardiac Stents and + Peripheral Vascular Disease   Rhythm:Regular Rate:Normal     Neuro/Psych Depression Dementia CVA negative neurological ROS     GI/Hepatic Neg liver ROS, PUD, GERD  Medicated,  Endo/Other  diabetesHyperthyroidism   Renal/GU Renal disease  negative genitourinary   Musculoskeletal  (+) Arthritis , Osteoarthritis,    Abdominal   Peds  Hematology negative hematology ROS (+)   Anesthesia Other Findings   Reproductive/Obstetrics negative OB ROS                           Anesthesia Physical Anesthesia Plan  ASA: III  Anesthesia Plan: General   Post-op Pain Management:    Induction: Intravenous  PONV Risk Score and Plan: 3 and Ondansetron, Dexamethasone and Midazolam  Airway Management Planned: Oral ETT  Additional Equipment:   Intra-op Plan:   Post-operative Plan: Extubation in OR  Informed Consent: I have reviewed the patients History and Physical, chart, labs and discussed the procedure including the risks, benefits and alternatives for the proposed anesthesia with the patient or authorized representative who has indicated his/her understanding and acceptance.     Dental advisory given  Plan Discussed with: CRNA  Anesthesia Plan Comments:         Anesthesia Quick Evaluation

## 2019-03-31 NOTE — Progress Notes (Signed)
Communicated with patients spouse Geraldine Tesar and was inform of pt recovery after pt ERCP, and current care plan. Inform family that we will notify of any new changes in plan of care.

## 2019-03-31 NOTE — Progress Notes (Signed)
  [facility] Progress Note   Subjective: Seen prior to HD. No C/Os because he's concerned about who is going to stick him. Had ERCP today per Dr. Watt Climes.   Objective Vitals:   03/31/19 1329 03/31/19 1339 03/31/19 1349 03/31/19 1424  BP: (!) 150/60 (!) 142/63 (!) 147/68 132/71  Pulse: 71 69 70 69  Resp: 17 15 10 12   Temp: (!) 97.1 F (36.2 C)   (!) 97.4 F (36.3 C)  TempSrc: Temporal     SpO2: 100% 100% 97% 100%  Weight:      Height:        Physical Exam General: Pleasant older male in NAD Heart: S1,S2 RRR Lungs: CTAB A/P Uses CPAP Abdomen: soft, NT Extremities: No LE edema, edema noted in hands Dialysis Access: R AVF + bruit  Additional Objective Labs: Basic Metabolic Panel: Recent Labs  Lab 03/27/19 1002  03/30/19 0353 03/30/19 1614 03/31/19 0422  NA 139   < > 140 139 139  K 4.8   < > 5.4* 4.2 4.5  CL 104   < > 103 99 100  CO2 24   < > 20* 26 24  GLUCOSE 102*   < > 72 85 114*  BUN 44*   < > 66* 32* 37*  CREATININE 7.77*   < > 12.38* 7.68* 9.35*  CALCIUM 8.5*   < > 8.0* 8.2* 8.0*  PHOS 3.2  --   --  3.7  --    < > = values in this interval not displayed.   Liver Function Tests: Recent Labs  Lab 03/28/19 0323 03/30/19 0353 03/30/19 1614 03/31/19 0422  AST 86* 42*  --  48*  ALT 197* 102*  --  90*  ALKPHOS 200* 206*  --  253*  BILITOT 2.3* 1.0  --  1.1  PROT 6.4* 5.9*  --  6.6  ALBUMIN 2.8* 2.7* 2.9* 2.8*   No results for input(s): LIPASE, AMYLASE in the last 168 hours. CBC: Recent Labs  Lab 03/26/19 2230  03/28/19 0323 03/29/19 0433 03/30/19 0353 03/30/19 1614 03/31/19 0422  WBC 9.5   < > 6.7 7.0 6.9 7.7 7.2  NEUTROABS 7.2  --   --   --   --   --   --   HGB 12.5*   < > 10.8* 10.9* 10.6* 10.7* 10.0*  HCT 39.1   < > 33.7* 33.7* 33.2* 32.9* 31.0*  MCV 100.8*   < > 99.7 99.7 100.0 98.8 99.4  PLT 197   < > 193 190 203 199 199   < > = values in this interval not displayed.   Blood Culture    Component Value Date/Time   SDES  BLOOD LEFT HAND 03/26/2019 2245   SPECREQUEST  03/26/2019 2245    BOTTLES DRAWN AEROBIC AND ANAEROBIC Blood Culture results may not be optimal due to an inadequate volume of blood received in culture bottles   CULT ESCHERICHIA COLI (A) 03/26/2019 2245   REPTSTATUS 03/29/2019 FINAL 03/26/2019 2245    Cardiac Enzymes: No results for input(s): CKTOTAL, CKMB, CKMBINDEX, TROPONINI in the last 168 hours. CBG: Recent Labs  Lab 03/27/19 0501  GLUCAP 94   Iron Studies: No results for input(s): IRON, TIBC, TRANSFERRIN, FERRITIN in the last 72 hours. @lablastinr3 @ Studies/Results: Dg Ercp Biliary & Pancreatic Ducts  Result Date: 03/31/2019 CLINICAL DATA:  ERCP for common bile duct stone. EXAM: ERCP TECHNIQUE: Multiple spot images obtained with the fluoroscopic device and submitted for interpretation post-procedure. COMPARISON:  MRCP-03/29/2019  FLUOROSCOPY TIME:  3 minutes, 46 seconds FINDINGS: Five spot intraoperative fluoroscopic images of the right upper abdominal quadrant during ERCP are provided for review Initial image demonstrates an ERCP probe overlying the right upper abdominal quadrant. Cholecystectomy clips overlies expected location of gallbladder fossa. There is faint opacification of the moderate to markedly dilated common bile duct with multiple ill-defined filling defects within the CBD worrisome for extensive choledocholithiasis as demonstrated on preceding MRCP. Subsequent images demonstrate insufflation of a balloon within the mid/distal aspect of the CBD. Subsequent images demonstrate insufflation of a balloon within the central aspect of the CBD with subsequent presumed biliary sweeping and sphincterotomy. Completion image demonstrates placement of an internal plastic biliary stent with distal tip overlying the descending portion of the duodenum in superior tip overlying the superior/mid aspect the CBD. IMPRESSION: ERCP with findings suggestive of extensive cholelithiasis with subsequent  biliary sweeping / sphincterotomy and internal biliary stent placement as above. These images were submitted for radiologic interpretation only. Please see the procedural report for the amount of contrast and the fluoroscopy time utilized. Electronically Signed   By: Sandi Mariscal M.D.   On: 03/31/2019 13:32   Medications: . sodium chloride 20 mL (03/29/19 1556)  . sodium chloride    . sodium chloride    . sodium chloride    . sodium chloride    . cefTRIAXone (ROCEPHIN)  IV 2 g (03/30/19 1838)  . lactated ringers Stopped (03/27/19 1610)   . allopurinol  100 mg Oral Daily  . aspirin EC  325 mg Oral Daily  . Chlorhexidine Gluconate Cloth  6 each Topical Q0600  . cinacalcet  90 mg Oral BID WC  . citalopram  10 mg Oral QHS  . colestipol  3 g Oral Daily  . feeding supplement (NEPRO CARB STEADY)  237 mL Oral BID BM  . gabapentin  600 mg Oral QHS  . heparin  5,000 Units Subcutaneous Q8H  . methimazole  2.5 mg Oral Daily  . midodrine  10 mg Oral TID WC  . pantoprazole  40 mg Oral QHS     Dialysis Orders: AF TTS  4:15 h 101kg 2/2.25 bath AVF Hep none no ESA or Fe  hectorol 3 mcg IV TIW Recent labs: hgb 12.9 33% sat Ca/P ok K 5s iPTH 378   Assessment/Plan: 1. Sepsis due to E coli bacteremia.- ^ procalcitonin- on Vanc, Maxipime Echo showed mild calcification of the Ao value - could not r/o small vegetation - suggested TEE to further r/o. r/o Cdiff, COVID neg. 2. Elevated LFTs suggestive of shock liver AST 347 ALT 416 alk phos 285 but total bili 3.9(last outpt alk phos was 133 12/2018 - no outpatient LFTs since last hospitalization . Repeat labs 6/27 show marked improved in LFTs and decline in T bili to 2.3. Episodes of Kleb pneuo in the past year related to biliary issues - per primary 3. ERCP today. Choledocholithiasisi-partial removal with biliary sphincterotomy-stent inserted. Common bile duct dilated, biliary tree swept.  4. ESRD- TTS - HD today to get back on schedule. K+  4.5.  5. Hypertension/volume- chronic intra dialytic hypotension during HD treatments even when not ill. UFG 2.5 liters today. Midodrine prior to HD. BP stable at present. Uses UFP 2 D/T early sign off-attempt at OP center.  6. Anemia-HGB 10.0 Follow HGB.  7. Metabolic bone disease- Continue Binders/VDRA 8. Nutrition- diet + supplements/vitamins   H.  NP-C 03/31/2019, 4:19 PM  [employer] [phone]

## 2019-03-31 NOTE — Anesthesia Postprocedure Evaluation (Signed)
Anesthesia Post Note  Patient: [name]  Procedure(s) Performed: ENDOSCOPIC RETROGRADE CHOLANGIOPANCREATOGRAPHY (ERCP) WITH PROPOFOL (N/A ) SPHINCTEROTOMY REMOVAL OF STONES BILIARY STENT PLACEMENT BILIARY DILATION     Patient location during evaluation: Endoscopy Anesthesia Type: General Level of consciousness: awake and alert Pain management: pain level controlled Vital Signs Assessment: post-procedure vital signs reviewed and stable Respiratory status: spontaneous breathing, nonlabored ventilation and respiratory function stable Cardiovascular status: blood pressure returned to baseline and stable Postop Assessment: no apparent nausea or vomiting Anesthetic complications: no    Last Vitals:  Vitals:   03/31/19 1339 03/31/19 1349  BP: (!) 142/63 (!) 147/68  Pulse: 69 70  Resp: 15 10  Temp:    SpO2: 100% 97%    Last Pain:  Vitals:   03/31/19 1349  TempSrc:   PainSc: 0-No pain                 Cleota Pellerito,W. EDMOND

## 2019-03-31 NOTE — Care Management Important Message (Signed)
Important Message  Patient Details  Name: [name] MRN: [id number] Date of Birth: Apr 11, 1951   Medicare Important Message Given:        Cabell, [place] 03/31/2019, 5:22 PM

## 2019-03-31 NOTE — Op Note (Signed)
[facility] Patient Name: [name] Procedure Date : 03/31/2019 MRN: [id number] Attending MD: Clarene Essex , MD Date of Birth: Jun 14, 1951 CSN: [id number] Age: 68 Admit Type: Inpatient Procedure:                ERCP Indications:              Common bile duct stone(s) on MRCP, Suspected                            ascending cholangitis Providers:                Clarene Essex, MD, Baird Cancer, RN, Elspeth Cho                            Tech., Technician, Imagene Riches, CRNA Referring MD:              Medicines:                General Anesthesia Complications:            No immediate complications. Estimated Blood Loss:     Estimated blood loss was minimal. Procedure:                Pre-Anesthesia Assessment:                           - Prior to the procedure, a History and Physical                            was performed, and patient medications and                            allergies were reviewed. The patient's tolerance of                            previous anesthesia was also reviewed. The risks                            and benefits of the procedure and the sedation                            options and risks were discussed with the patient.                            All questions were answered, and informed consent                            was obtained. Prior Anticoagulants: The patient has                            taken heparin, last dose was day of procedure. ASA                            Grade Assessment: III - A patient with severe  systemic disease. After reviewing the risks and                            benefits, the patient was deemed in satisfactory                            condition to undergo the procedure.                           After obtaining informed consent, the scope was                            passed under direct vision. Throughout the                            procedure, the patient's blood pressure,  pulse, and                            oxygen saturations were monitored continuously. The                            [id number]-Q180V ([id number] Olympus duodenoscope was                            introduced through the mouth, and used to inject                            contrast into and used to cannulate the bile duct.                            The ERCP was technically difficult and complex.                            Successful completion of the procedure was aided by                            performing the maneuvers documented (below) in this                            report. Scope In: Scope Out: Findings:      The upper GI tract was traversed under direct vision without detailed       examination. A medium amount of food (residue) was found in the gastric       body. The major papilla was bulging. Deep selective cannulation was       readily obtained on the first attempt and there was no pancreatic duct       injection or wire advancement throughout the procedure the biliary       sphincterotomy was extended with a Hydratome sphincterotome using ERBE       electrocautery. There was no post-sphincterotomy bleeding. We proceeded       with sphincterotomy until we had adequate biliary drainage and could get       the fully bowed sphincterotome easily in and out of the duct and       significant  choledocholithiasis was found in a significantly dilated       duct. To discover objects, the biliary tree was swept with an 15 -18 mm       balloon but we could not pull the balloon through the patent       sphincterotomy site and there seemed to be a large stone blocking the       distal duct and we proceeded with dilation as below and tried the 2.5       basket and were able to capture 3 different stones and crushed them and       2 others were removed with the basket but we could not capture the       larger stone and multiple attempts and we retried to sweep the duct       starting at the  upper third of the main bile duct, middle third of the       main bile duct, lower third of the main duct and left main hepatic duct       using the 12 to 15 mm balloon as well. Sludge was swept from the duct.       Many stones were removed. A few stones remained. Dilation of common bile       duct with a 07-12-11 mm balloon (to a maximum balloon size of 12 mm)       dilator was successful and done x2 throughout the procedure. But after a       prolonged effort wounds and multiple attempts with the basket one 10 Fr       by 7 cm plastic stent with a single external flap and a single internal       flap was placed 5 cm into the common bile duct. Bile and sludge flowed       through the stent. The stent was in good position. Impression:               - A medium amount of food (residue) in the stomach.                           - The major papilla appeared to be bulging.                           - Choledocholithiasis was found. Partial removal                            was accomplished with biliary sphincterotomy; a                            stent was inserted.                           - A biliary sphincterotomy was performed.                           - The biliary tree was swept. The basket was also                            used both to remove stones and crushed stones                           -  Common bile duct was successfully dilated.                           - One plastic stent was placed into the common bile                            duct. Moderate Sedation:      moderate sedation-none Recommendation:           - Clear liquid diet for 6 hours. Slowly advance as                            tolerated                           - Continue present medications.                           - Return to GI clinic PRN.                           - Telephone GI clinic if symptomatic PRN.                           - Repeat ERCP in 1 month to remove stent and                             proceed with spyglass and lithotripsy if needed and                            complete removal hopefully at that time. Procedure Code(s):        --- Professional ---                           [phone], Endoscopic retrograde                            cholangiopancreatography (ERCP); with placement of                            endoscopic stent into biliary or pancreatic duct,                            including pre- and post-dilation and guide wire                            passage, when performed, including sphincterotomy,                            when performed, each stent                           [id number], Endoscopic retrograde                            cholangiopancreatography (ERCP); with removal of  calculi/debris from biliary/pancreatic duct(s) Diagnosis Code(s):        --- Professional ---                           K80.50, Calculus of bile duct without cholangitis                            or cholecystitis without obstruction                           K83.8, Other specified diseases of biliary tract CPT copyright 2019 [employer]. All rights reserved. The codes documented in this report are preliminary and upon coder review may  be revised to meet current compliance requirements. Clarene Essex, MD 03/31/2019 1:34:45 PM This report has been signed electronically. Number of Addenda: 0

## 2019-03-31 NOTE — Anesthesia Procedure Notes (Signed)
Procedure Name: Intubation Date/Time: 03/31/2019 11:38 AM Performed by: Imagene Riches, CRNA Pre-anesthesia Checklist: Patient identified, Emergency Drugs available, Suction available and Patient being monitored Patient Re-evaluated:Patient Re-evaluated prior to induction Oxygen Delivery Method: Circle System Utilized Preoxygenation: Pre-oxygenation with 100% oxygen Induction Type: IV induction Laryngoscope Size: Miller and 3 Grade View: Grade I Tube type: Oral Tube size: 7.5 mm Number of attempts: 1 Airway Equipment and Method: Stylet and Oral airway Placement Confirmation: ETT inserted through vocal cords under direct vision,  positive ETCO2 and breath sounds checked- equal and bilateral Secured at: 24 cm Tube secured with: Tape Dental Injury: Teeth and Oropharynx as per pre-operative assessment  Comments: Sabra Heck 3 provided grade 1 view of glottis. However, vocal cords are difficult to identify due compression from surrounding tissues. Glottis deviated slightly to left. ETT easily passed through cords.

## 2019-03-31 NOTE — Progress Notes (Signed)
PROGRESS NOTE    [name]  ZOX:[id number] DOB: 09/03/1951 DOA: 03/26/2019 PCP: Biagio Borg, MD   Brief Narrative:  HPI on 03/27/2019 by Dr. Aurea Graff 68 yo male esrd on hd TTS, dementia , multiple admissions for sepsis and bacteremia, has AVF on right arm. He was brought to the ED by family after he returned from HD feeling weak and they found his BP was SBP 70'S. Wife told the ED staff that she thinks they took too much fluid off during HD and that this has happened before.  In the Ed during my evaluation the patient is alert and oriented but has dementia and is a poor historian. His SBP is in the 70's but he is awake and talking , denies any chest pain , sob , n/v, he just had two BM's no diarrhea. Had low grade fever on arrival. Received cefepime and vancomycin as well as 500 cc of fluids.   Interim history Admitted for sepsis secondary to bacteremia. On IV antibiotics. MRCP obtained showing stones. GI consulted for ERCP. Assessment & Plan   Severe sepsis secondary to E. coli bacteremia -On admission, patient was noted to have hypothermia, tachycardia, tachypnea, elevated LFTs -Blood cultures positive for gram-negative rods, E. Coli -Continue ceftriaxone -Upon review of patient's chart, he has also had Klebsiella pneumonia bacteremia in the past which was due to biliary issues.  In January 2020, patient did have to have ERCP as well as MRCP and sphincterotomy -Echocardiogram showed an EF of 55 to 81%, LV diastolic Doppler parameters consistent with impaired relaxation.  Calcification of the aortic valve.  Cannot rule out small vegetation on the aortic valve but not definite, would suggest TEE for further assessment. -MRCP ordered, Increased severe diffuse biliary ductal dilatation, with increase in numerous calculi throughout the common hepatic and bile ducts measuring up to 1.8 cm. -Gastroenterology consulted and appreciated. Planning for ERCP today  Elevated LFTs -With a  secondary to the above, shock liver, hypotension -LFTs are improving, continue to monitor CMP  ESRD with hyperkalemia -Dialyzes Tuesday, Thursday, Saturday -Nephrology consulted and appreciated  Diarrhea -C diff negative -Continue Imodium  Anemia of chronic disease -hemoglobin stable,currently 10 -Continue to monitor CBC  Hyperthyroidism -Continue Tapazole  Chronic hypotension -Continue Midodrine.  Patient tells me he has not been on Midodrine in quite some time.  Depression -Continue Celexa  Neuropathy/chronic pain -Continue gabapentin, hydrocodone and morphine PRN -pain better controlled today   DVT Prophylaxis  SCDs  Code Status: Full  Family Communication: None at bedside  Disposition Plan: Admitted. Pending workup and improvement and ERCP.   Consultants Nephrology  Gastroenterology  Procedures  Echocardiogram MRCP  Antibiotics   Anti-infectives (From admission, onward)   Start     Dose/Rate Route Frequency Ordered Stop   03/27/19 2300  ceFEPIme (MAXIPIME) 1 g in sodium chloride 0.9 % 100 mL IVPB  Status:  Discontinued     1 g 200 mL/hr over 30 Minutes Intravenous Every 24 hours 03/26/19 2351 03/27/19 1638   03/27/19 1645  cefTRIAXone (ROCEPHIN) 2 g in sodium chloride 0.9 % 100 mL IVPB     2 g 200 mL/hr over 30 Minutes Intravenous Every 24 hours 03/27/19 1638     03/26/19 2350  vancomycin variable dose per unstable renal function (pharmacist dosing)  Status:  Discontinued      Does not apply See admin instructions 03/26/19 2351 03/27/19 1000   03/26/19 2345  ceFEPIme (MAXIPIME) 2 g in sodium chloride 0.9 % 100  mL IVPB     2 g 200 mL/hr over 30 Minutes Intravenous  Once 03/26/19 2337 03/27/19 0038   03/26/19 2345  metroNIDAZOLE (FLAGYL) IVPB 500 mg     500 mg 100 mL/hr over 60 Minutes Intravenous  Once 03/26/19 2337 03/27/19 0149   03/26/19 2345  vancomycin (VANCOCIN) IVPB 1000 mg/200 mL premix  Status:  Discontinued     1,000 mg 200 mL/hr over 60  Minutes Intravenous  Once 03/26/19 2337 03/26/19 2339   03/26/19 2345  vancomycin (VANCOCIN) 2,000 mg in sodium chloride 0.9 % 500 mL IVPB     2,000 mg 250 mL/hr over 120 Minutes Intravenous  Once 03/26/19 2339 03/27/19 8119      Subjective:   [name] seen and examined today.  Patient denies current pain, shortness of breath, abdominal pain, nausea or vomiting, constipation.  Feels diarrhea has improved.  Does complain of feeling weak and tired.    Objective:   Vitals:   03/30/19 2243 03/31/19 0406 03/31/19 0500 03/31/19 0817  BP: (!) 123/59 (!) 115/56  121/64  Pulse: 66 68  66  Resp:  15  17  Temp: 97.7 F (36.5 C) 97.8 F (36.6 C)  97.8 F (36.6 C)  TempSrc:      SpO2: 94% 100%  99%  Weight:   104.4 kg   Height:        Intake/Output Summary (Last 24 hours) at 03/31/2019 0926 Last data filed at 03/31/2019 0300 Gross per 24 hour  Intake 345.88 ml  Output 2530 ml  Net -2184.12 ml   Filed Weights   03/30/19 1112 03/30/19 1438 03/31/19 0500  Weight: 105.6 kg 103.1 kg 104.4 kg   Exam  General: Well developed, chronically ill appearing, NAD  HEENT: NCAT, mucous membranes moist.   Cardiovascular: S1 S2 auscultated, no rubs, murmurs or gallops. Regular rate and rhythm.  Respiratory: Clear to auscultation bilaterally   Abdomen: Soft, nontender, nondistended, + bowel sounds  Extremities: warm dry without cyanosis clubbing or edema  Neuro: AAOx3, nonfocal  Psych: Normal affect and demeanor, pleasant   Data Reviewed: I have personally reviewed following labs and imaging studies  CBC: Recent Labs  Lab 03/26/19 2230  03/28/19 0323 03/29/19 0433 03/30/19 0353 03/30/19 1614 03/31/19 0422  WBC 9.5   < > 6.7 7.0 6.9 7.7 7.2  NEUTROABS 7.2  --   --   --   --   --   --   HGB 12.5*   < > 10.8* 10.9* 10.6* 10.7* 10.0*  HCT 39.1   < > 33.7* 33.7* 33.2* 32.9* 31.0*  MCV 100.8*   < > 99.7 99.7 100.0 98.8 99.4  PLT 197   < > 193 190 203 199 199   < > = values in  this interval not displayed.   Basic Metabolic Panel: Recent Labs  Lab 03/27/19 1002 03/28/19 0323 03/29/19 0433 03/30/19 0353 03/30/19 1614 03/31/19 0422  NA 139 139 138 140 139 139  K 4.8 4.8 5.9* 5.4* 4.2 4.5  CL 104 104 105 103 99 100  CO2 24 23 19* 20* 26 24  GLUCOSE 102* 99 69* 72 85 114*  BUN 44* 53* 66* 66* 32* 37*  CREATININE 7.77* 9.68* 11.67* 12.38* 7.68* 9.35*  CALCIUM 8.5* 8.6* 8.6* 8.0* 8.2* 8.0*  MG 1.9  --   --   --   --   --   PHOS 3.2  --   --   --  3.7  --  GFR: Estimated Creatinine Clearance: 9.3 mL/min (A) (by C-G formula based on SCr of 9.35 mg/dL (H)). Liver Function Tests: Recent Labs  Lab 03/26/19 2230 03/28/19 0323 03/30/19 0353 03/30/19 1614 03/31/19 0422  AST 347* 86* 42*  --  48*  ALT 416* 197* 102*  --  90*  ALKPHOS 286* 200* 206*  --  253*  BILITOT 3.9* 2.3* 1.0  --  1.1  PROT 7.0 6.4* 5.9*  --  6.6  ALBUMIN 3.2* 2.8* 2.7* 2.9* 2.8*   No results for input(s): LIPASE, AMYLASE in the last 168 hours. No results for input(s): AMMONIA in the last 168 hours. Coagulation Profile: No results for input(s): INR, PROTIME in the last 168 hours. Cardiac Enzymes: No results for input(s): CKTOTAL, CKMB, CKMBINDEX, TROPONINI in the last 168 hours. BNP (last 3 results) No results for input(s): PROBNP in the last 8760 hours. HbA1C: No results for input(s): HGBA1C in the last 72 hours. CBG: Recent Labs  Lab 03/27/19 0501  GLUCAP 94   Lipid Profile: No results for input(s): CHOL, HDL, LDLCALC, TRIG, CHOLHDL, LDLDIRECT in the last 72 hours. Thyroid Function Tests: No results for input(s): TSH, T4TOTAL, FREET4, T3FREE, THYROIDAB in the last 72 hours. Anemia Panel: No results for input(s): VITAMINB12, FOLATE, FERRITIN, TIBC, IRON, RETICCTPCT in the last 72 hours. Urine analysis:    Component Value Date/Time   COLORURINE LT. YELLOW 06/26/2013 1200   APPEARANCEUR CLEAR 06/26/2013 1200   LABSPEC 1.020 06/26/2013 1200   PHURINE 8.5 06/26/2013  1200   GLUCOSEU 100 06/26/2013 1200   HGBUR MODERATE 06/26/2013 1200   BILIRUBINUR NEGATIVE 06/26/2013 1200   KETONESUR NEGATIVE 06/26/2013 1200   PROTEINUR >300 (A) 01/04/2010 1032   UROBILINOGEN 0.2 06/26/2013 1200   NITRITE NEGATIVE 06/26/2013 1200   LEUKOCYTESUR TRACE 06/26/2013 1200   Sepsis Labs: @LABRCNTIP (procalcitonin:4,lacticidven:4)  ) Recent Results (from the past 240 hour(s))  Culture, blood (routine x 2)     Status: None (Preliminary result)   Collection Time: 03/26/19 10:35 PM   Specimen: BLOOD  Result Value Ref Range Status   Specimen Description BLOOD LEFT ANTECUBITAL  Final   Special Requests   Final    BOTTLES DRAWN AEROBIC AND ANAEROBIC Blood Culture results may not be optimal due to an inadequate volume of blood received in culture bottles   Culture  Setup Time   Final    GRAM NEGATIVE RODS ANAEROBIC BOTTLE ONLY CRITICAL VALUE NOTED.  VALUE IS CONSISTENT WITH PREVIOUSLY REPORTED AND CALLED VALUE.    Culture   Final    GRAM NEGATIVE RODS IDENTIFICATION TO FOLLOW Performed at [facility], [place] [address], [place], [place] [address]    Report Status PENDING  Incomplete  Culture, blood (routine x 2)     Status: Abnormal   Collection Time: 03/26/19 10:45 PM   Specimen: BLOOD LEFT HAND  Result Value Ref Range Status   Specimen Description BLOOD LEFT HAND  Final   Special Requests   Final    BOTTLES DRAWN AEROBIC AND ANAEROBIC Blood Culture results may not be optimal due to an inadequate volume of blood received in culture bottles   Culture  Setup Time   Final    GRAM NEGATIVE RODS IN BOTH AEROBIC AND ANAEROBIC BOTTLES Organism ID to follow CRITICAL RESULT CALLED TO, READ BACK BY AND VERIFIED WITH: Hughie Closs PharmD 16:05 03/27/19 ([email or web address]) Performed at [facility], [place] [address], [place], [place] [address]    Culture ESCHERICHIA COLI (A)  Final  Report Status 03/29/2019 FINAL  Final   Organism ID, Bacteria ESCHERICHIA COLI  Final       Susceptibility   Escherichia coli - MIC*    AMPICILLIN <=2 SENSITIVE Sensitive     CEFAZOLIN <=4 SENSITIVE Sensitive     CEFEPIME <=1 SENSITIVE Sensitive     CEFTAZIDIME <=1 SENSITIVE Sensitive     CEFTRIAXONE <=1 SENSITIVE Sensitive     CIPROFLOXACIN <=0.25 SENSITIVE Sensitive     GENTAMICIN <=1 SENSITIVE Sensitive     IMIPENEM <=0.25 SENSITIVE Sensitive     TRIMETH/SULFA <=20 SENSITIVE Sensitive     AMPICILLIN/SULBACTAM <=2 SENSITIVE Sensitive     PIP/TAZO <=4 SENSITIVE Sensitive     Extended ESBL NEGATIVE Sensitive     * ESCHERICHIA COLI  Blood Culture ID Panel (Reflexed)     Status: Abnormal   Collection Time: 03/26/19 10:45 PM  Result Value Ref Range Status   Enterococcus species NOT DETECTED NOT DETECTED Final   Listeria monocytogenes NOT DETECTED NOT DETECTED Final   Staphylococcus species NOT DETECTED NOT DETECTED Final   Staphylococcus aureus (BCID) NOT DETECTED NOT DETECTED Final   Streptococcus species NOT DETECTED NOT DETECTED Final   Streptococcus agalactiae NOT DETECTED NOT DETECTED Final   Streptococcus pneumoniae NOT DETECTED NOT DETECTED Final   Streptococcus pyogenes NOT DETECTED NOT DETECTED Final   Acinetobacter baumannii NOT DETECTED NOT DETECTED Final   Enterobacteriaceae species DETECTED (A) NOT DETECTED Final    Comment: Enterobacteriaceae represent a large family of gram-negative bacteria, not a single organism. CRITICAL RESULT CALLED TO, READ BACK BY AND VERIFIED WITH: Hughie Closs PharmD 16:00 03/27/19 ([email or web address])    Enterobacter cloacae complex NOT DETECTED NOT DETECTED Final   Escherichia coli DETECTED (A) NOT DETECTED Final    Comment: CRITICAL RESULT CALLED TO, READ BACK BY AND VERIFIED WITH: Hughie Closs PharmD 16:00 03/27/19 ([email or web address])    Klebsiella oxytoca NOT DETECTED NOT DETECTED Final   Klebsiella pneumoniae NOT DETECTED NOT DETECTED Final   Proteus species NOT DETECTED NOT DETECTED Final   Serratia marcescens NOT DETECTED NOT DETECTED Final    Carbapenem resistance NOT DETECTED NOT DETECTED Final   Haemophilus influenzae NOT DETECTED NOT DETECTED Final   Neisseria meningitidis NOT DETECTED NOT DETECTED Final   Pseudomonas aeruginosa NOT DETECTED NOT DETECTED Final   Candida albicans NOT DETECTED NOT DETECTED Final   Candida glabrata NOT DETECTED NOT DETECTED Final   Candida krusei NOT DETECTED NOT DETECTED Final   Candida parapsilosis NOT DETECTED NOT DETECTED Final   Candida tropicalis NOT DETECTED NOT DETECTED Final    Comment: Performed at [facility], [place] [address] [place], [place] [address]  SARS Coronavirus 2 (CEPHEID - Performed in [facility] lab), Hosp Order     Status: None   Collection Time: 03/26/19 11:25 PM   Specimen: Nasopharyngeal Swab  Result Value Ref Range Status   SARS Coronavirus 2 NEGATIVE NEGATIVE Final    Comment: (NOTE) If result is NEGATIVE SARS-CoV-2 target nucleic acids are NOT DETECTED. The SARS-CoV-2 RNA is generally detectable in upper and lower  respiratory specimens during the acute phase of infection. The lowest  concentration of SARS-CoV-2 viral copies this assay can detect is 250  copies / mL. A negative result does not preclude SARS-CoV-2 infection  and should not be used as the sole basis for treatment or other  patient management decisions.  A negative result may occur with  improper specimen collection / handling, submission of specimen other  than nasopharyngeal swab,  presence of viral mutation(s) within the  areas targeted by this assay, and inadequate number of viral copies  (<250 copies / mL). A negative result must be combined with clinical  observations, patient history, and epidemiological information. If result is POSITIVE SARS-CoV-2 target nucleic acids are DETECTED. The SARS-CoV-2 RNA is generally detectable in upper and lower  respiratory specimens dur ing the acute phase of infection.  Positive  results are indicative of active infection with  SARS-CoV-2.  Clinical  correlation with patient history and other diagnostic information is  necessary to determine patient infection status.  Positive results do  not rule out bacterial infection or co-infection with other viruses. If result is PRESUMPTIVE POSTIVE SARS-CoV-2 nucleic acids MAY BE PRESENT.   A presumptive positive result was obtained on the submitted specimen  and confirmed on repeat testing.  While 2019 novel coronavirus  (SARS-CoV-2) nucleic acids may be present in the submitted sample  additional confirmatory testing may be necessary for epidemiological  and / or clinical management purposes  to differentiate between  SARS-CoV-2 and other Sarbecovirus currently known to infect humans.  If clinically indicated additional testing with an alternate test  methodology [phone]) is advised. The SARS-CoV-2 RNA is generally  detectable in upper and lower respiratory sp ecimens during the acute  phase of infection. The expected result is Negative. Fact Sheet for Patients:  StrictlyIdeas.no Fact Sheet for Healthcare Providers: BankingDealers.co.za This test is not yet approved or cleared by the [place] FDA and has been authorized for detection and/or diagnosis of SARS-CoV-2 by FDA under an Emergency Use Authorization (EUA).  This EUA will remain in effect (meaning this test can be used) for the duration of the COVID-19 declaration under Section 564(b)(1) of the Act, 21 U.S.C. section 360bbb-3(b)(1), unless the authorization is terminated or revoked sooner. Performed at [facility], [place] [address], [place], [place] [address]   MRSA PCR Screening     Status: None   Collection Time: 03/27/19  5:03 AM   Specimen: Nasal Mucosa; Nasopharyngeal  Result Value Ref Range Status   MRSA by PCR NEGATIVE NEGATIVE Final    Comment:        The GeneXpert MRSA Assay (FDA approved for NASAL specimens only), is one component of a  comprehensive MRSA colonization surveillance program. It is not intended to diagnose MRSA infection nor to guide or monitor treatment for MRSA infections. Performed at [facility], [place] [address], [place], [place] [address]   C difficile quick scan w PCR reflex     Status: None   Collection Time: 03/27/19  9:32 AM   Specimen: STOOL  Result Value Ref Range Status   C Diff antigen NEGATIVE NEGATIVE Final   C Diff toxin NEGATIVE NEGATIVE Final   C Diff interpretation No C. difficile detected.  Final    Comment: Performed at [facility], [place] [address] [place], [place] [address]      Radiology Studies: No results found.   Scheduled Meds: . allopurinol  100 mg Oral Daily  . aspirin EC  325 mg Oral Daily  . Chlorhexidine Gluconate Cloth  6 each Topical Q0600  . cinacalcet  90 mg Oral BID WC  . citalopram  10 mg Oral QHS  . colestipol  3 g Oral Daily  . feeding supplement (NEPRO CARB STEADY)  237 mL Oral BID BM  . gabapentin  600 mg Oral QHS  . heparin  5,000 Units Subcutaneous Q8H  . methimazole  2.5 mg Oral Daily  . midodrine  10 mg Oral TID WC  . pantoprazole  40 mg Oral QHS   Continuous Infusions: . sodium chloride 20 mL (03/29/19 1556)  . sodium chloride    . sodium chloride    . sodium chloride    . sodium chloride    . sodium chloride Stopped (03/31/19 0413)  . cefTRIAXone (ROCEPHIN)  IV 2 g (03/30/19 1838)  . lactated ringers Stopped (03/27/19 1610)     LOS: 4 days   Time Spent in minutes   30 minutes  Kimbra Marcelino D.O. on 03/31/2019 at 9:26 AM  Between 7am to 7pm - Please see pager noted on [email or web address]  After 7pm go to [email or web address]  And look for the night coverage person covering for me after hours  [facility] Office  [phone]

## 2019-04-01 ENCOUNTER — Encounter (HOSPITAL_COMMUNITY): Payer: Self-pay | Admitting: Gastroenterology

## 2019-04-01 ENCOUNTER — Other Ambulatory Visit: Payer: Self-pay

## 2019-04-01 DIAGNOSIS — N186 End stage renal disease: Secondary | ICD-10-CM | POA: Diagnosis not present

## 2019-04-01 DIAGNOSIS — E1129 Type 2 diabetes mellitus with other diabetic kidney complication: Secondary | ICD-10-CM | POA: Diagnosis not present

## 2019-04-01 DIAGNOSIS — Z992 Dependence on renal dialysis: Secondary | ICD-10-CM | POA: Diagnosis not present

## 2019-04-01 DIAGNOSIS — K8050 Calculus of bile duct without cholangitis or cholecystitis without obstruction: Secondary | ICD-10-CM

## 2019-04-01 LAB — COMPREHENSIVE METABOLIC PANEL
ALT: 115 U/L — ABNORMAL HIGH (ref 0–44)
AST: 96 U/L — ABNORMAL HIGH (ref 15–41)
Albumin: 2.9 g/dL — ABNORMAL LOW (ref 3.5–5.0)
Alkaline Phosphatase: 289 U/L — ABNORMAL HIGH (ref 38–126)
Anion gap: 14 (ref 5–15)
BUN: 26 mg/dL — ABNORMAL HIGH (ref 8–23)
CO2: 26 mmol/L (ref 22–32)
Calcium: 7.9 mg/dL — ABNORMAL LOW (ref 8.9–10.3)
Chloride: 98 mmol/L (ref 98–111)
Creatinine, Ser: 7.08 mg/dL — ABNORMAL HIGH (ref 0.61–1.24)
GFR calc Af Amer: 8 mL/min — ABNORMAL LOW (ref >60)
GFR calc non Af Amer: 7 mL/min — ABNORMAL LOW (ref >60)
Glucose, Bld: 98 mg/dL (ref 70–99)
Potassium: 4.3 mmol/L (ref 3.5–5.1)
Sodium: 138 mmol/L (ref 135–145)
Total Bilirubin: 0.9 mg/dL (ref 0.3–1.2)
Total Protein: 6.8 g/dL (ref 6.5–8.1)

## 2019-04-01 LAB — CBC
HCT: 32 % — ABNORMAL LOW (ref 39.0–52.0)
Hemoglobin: 10.4 g/dL — ABNORMAL LOW (ref 13.0–17.0)
MCH: 31.9 pg (ref 26.0–34.0)
MCHC: 32.5 g/dL (ref 30.0–36.0)
MCV: 98.2 fL (ref 80.0–100.0)
Platelets: 194 10*3/uL (ref 150–400)
RBC: 3.26 MIL/uL — ABNORMAL LOW (ref 4.22–5.81)
RDW: 16.2 % — ABNORMAL HIGH (ref 11.5–15.5)
WBC: 9.3 10*3/uL (ref 4.0–10.5)
nRBC: 0 % (ref 0.0–0.2)

## 2019-04-01 LAB — NOVEL CORONAVIRUS, NAA (HOSP ORDER, SEND-OUT TO REF LAB; TAT 18-24 HRS): SARS-CoV-2, NAA: NOT DETECTED

## 2019-04-01 IMAGING — CR DG CHEST 2V
2 series · 2 of 2 positions shown · non-contrast
Comparison: Radiograph 01/15/2018

CLINICAL DATA: Shortness of breath.

EXAM:
CHEST - 2 VIEW

[chest lat]
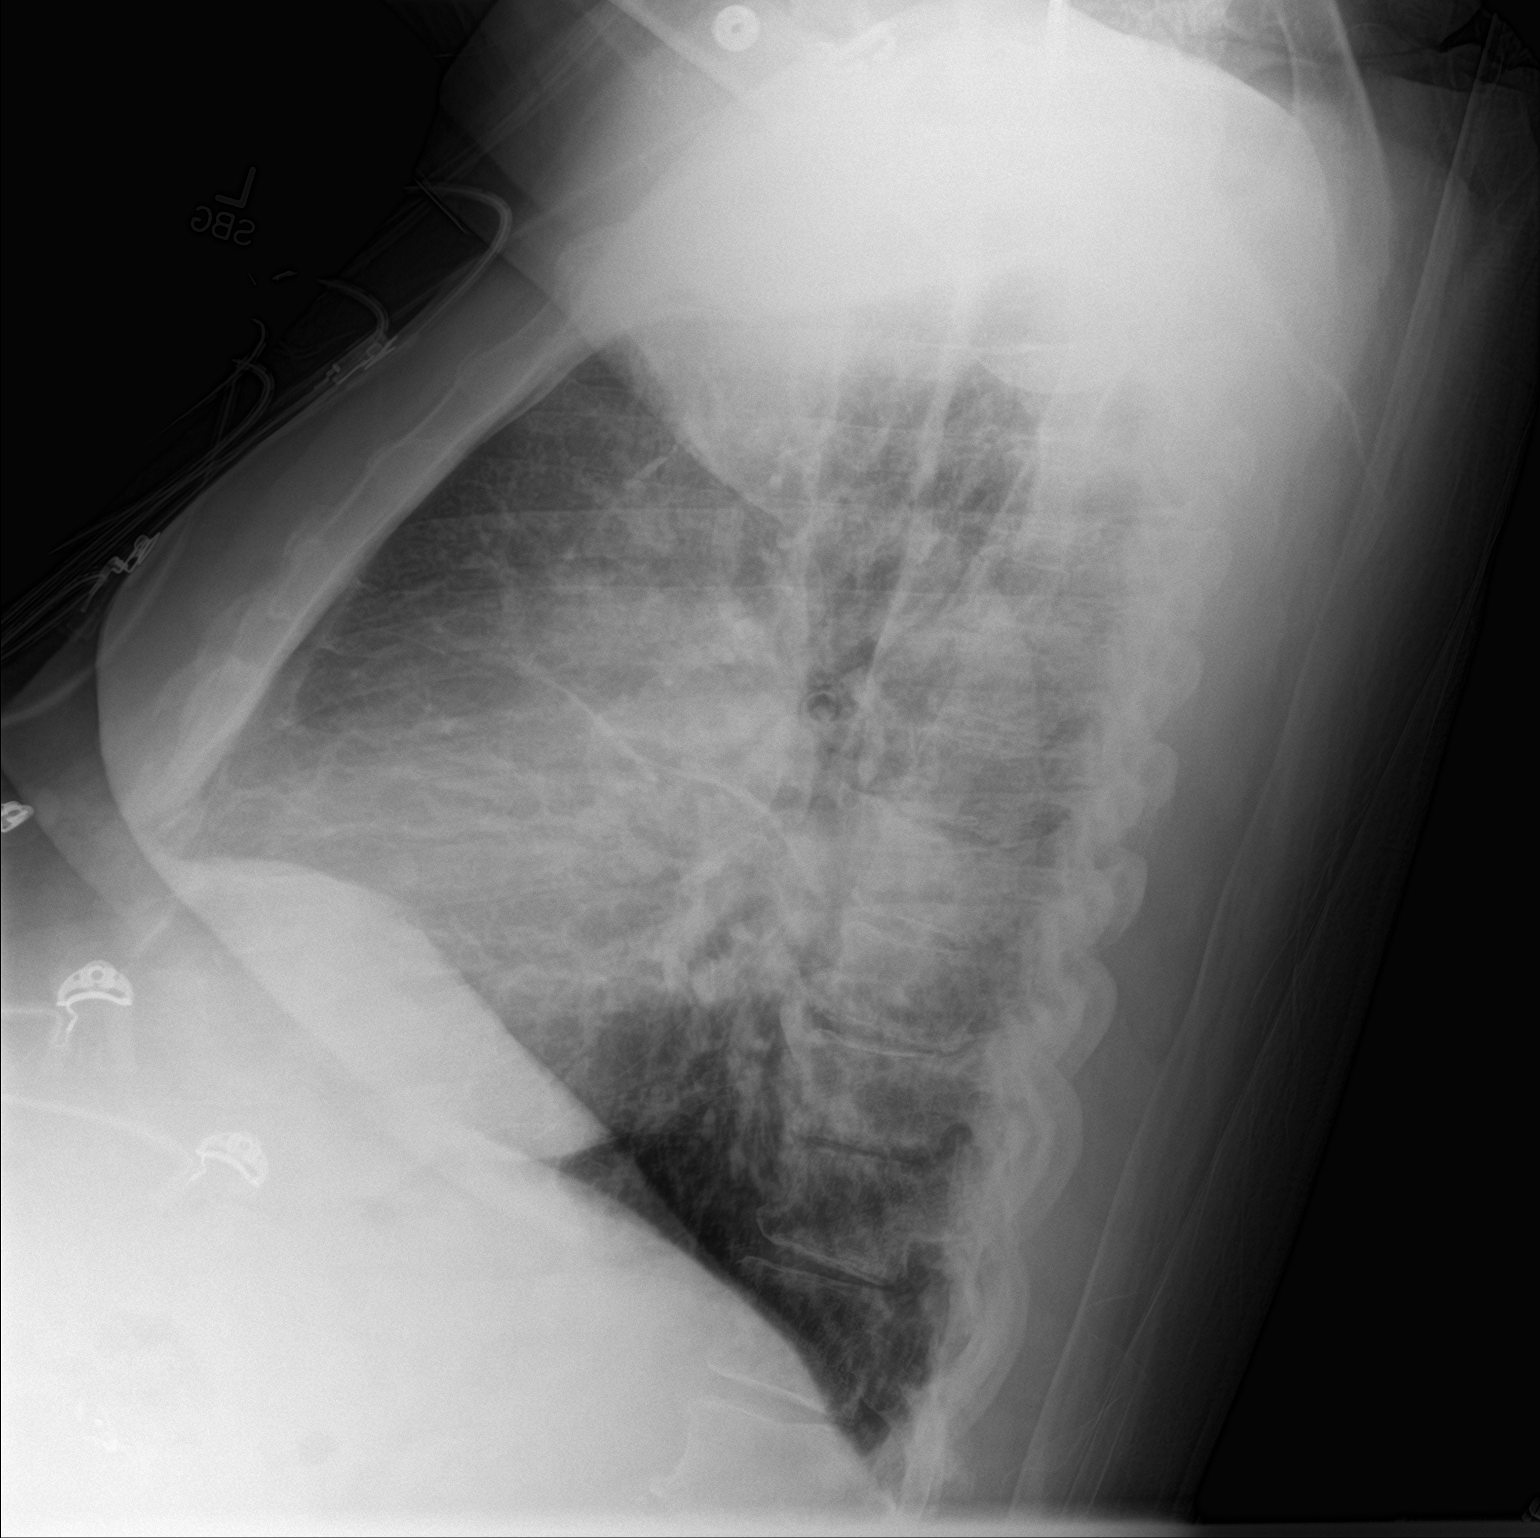

[chest ap]
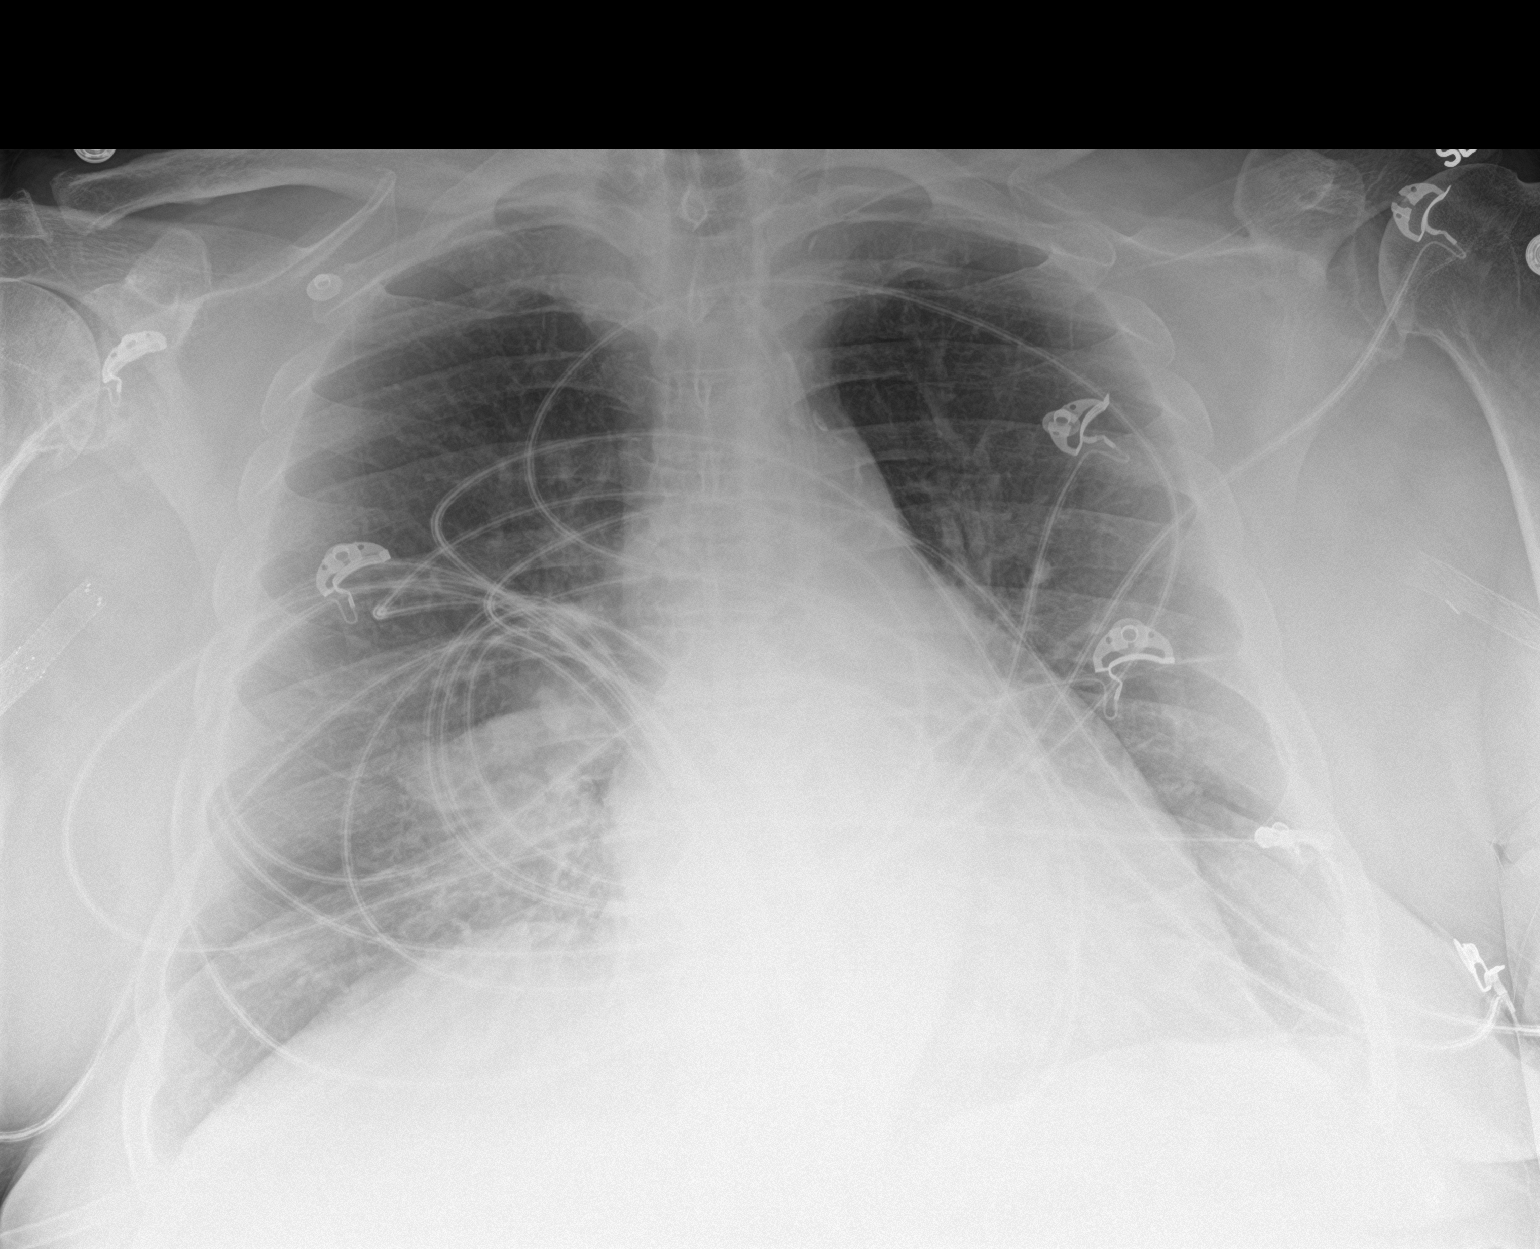

[2 of 2 positions shown; findings below may reference images not displayed]

FINDINGS: Patchy and confluent consolidation in the right lower lobe, new from
prior exam. Upper normal heart size normal mediastinal contours.
Aortic atherosclerosis. No pulmonary edema, pleural effusion or
pneumothorax. Vascular stent in the right axilla. Degenerative
change of the shoulders.
IMPRESSION: Patchy and confluent consolidation in the right lower lobe,
suspicious for pneumonia. Followup PA and lateral chest X-ray is
recommended in 3-4 weeks following trial of antibiotic therapy to
ensure resolution and exclude underlying malignancy.

## 2019-04-01 MED ORDER — ZOLPIDEM TARTRATE 5 MG PO TABS
5.0000 mg | ORAL_TABLET | Freq: Every evening | ORAL | Status: DC | PRN
  Administered 2019-04-01: 5 mg via ORAL
  Filled 2019-04-01: qty 1

## 2019-04-01 MED ORDER — CEFAZOLIN SODIUM-DEXTROSE 2-4 GM/100ML-% IV SOLN
2.0000 g | Freq: Once | INTRAVENOUS | Status: AC
  Administered 2019-04-01: 2 g via INTRAVENOUS
  Filled 2019-04-01: qty 100

## 2019-04-01 MED ORDER — BISMUTH SUBSALICYLATE 262 MG/15ML PO SUSP
30.0000 mL | Freq: Once | ORAL | Status: AC
  Administered 2019-04-01: 30 mL via ORAL
  Filled 2019-04-01: qty 236

## 2019-04-01 MED ORDER — CEFAZOLIN SODIUM-DEXTROSE 2-4 GM/100ML-% IV SOLN
2.0000 g | INTRAVENOUS | Status: DC
  Administered 2019-04-02: 2 g via INTRAVENOUS
  Filled 2019-04-01 (×2): qty 100

## 2019-04-01 NOTE — Progress Notes (Addendum)
Patient ID: [name], male   DOB: 13-Feb-1951, 68 y.o.   MRN: [id number]  PROGRESS NOTE    JHETT FRETWELL  SJG:[id number] DOB: Jan 03, 1951 DOA: 03/26/2019 PCP: Biagio Borg, MD   Brief Narrative:  68 year old male with history of end-stage renal disease on hemodialysis, dementia, multiple admissions for sepsis and bacteremia presented on 03/26/2019 with hypotension, blood pressure in the 70s along with low-grade fever.  He was started on broad-spectrum antibiotics.  He was found to have E. coli bacteremia.  MRCP showed diffuse biliary ductal dilatation with biliary stones.  He underwent ERCP with subsequent biliary sphincterotomy, removal of stones and plastic stent placement in common bile duct.  Nephrology has been following the patient.  Assessment & Plan:   Severe sepsis: Present on admission, secondary to bacteremia E. coli bacteremia Choledocholithiasis with elevated LFTs -Blood cultures growing E. coli.  Currently on Rocephin.  We will switch to IV Ancef. -Sepsis resolved.  Currently Mody medically stable. -MRCP showed increased severe diffuse biliary ductal dilatation, with increase in numerous calculi throughout the common hepatic and bile ducts.  Status post ERCP on 03/31/2019 subsequent biliary sphincterotomy, removal of stones and plastic stent placement in common bile duct.  Patient will need repeat ERCP as an outpatient for removal of stent. -Tolerating diet.  Will advance diet.  Suspected aortic valve vegetation -Echo done during the hospitalization showed EF of 55 to 65%, LV diastolic Doppler parameters consistent with impaired relaxation.  Calcification of the aortic valve.  Cannot rule out small vegetation on the aortic valve but no definite, would suggest TEE for further assessment. -Consulted cardiology to perform TEE.  We will follow-up with cardiology regarding timing.  End-stage renal disease on hemodialysis -Nephrology following.  Dialysis as per nephrology  schedule  Anemia of chronic disease--hemoglobin stable.  Monitor  Hypothyroidism--continue Tapazole  Chronic hypotension-continue midodrine  Diarrhea -C. difficile negative.  Continue Imodium as needed.  Continue colestipol  Depression-continue Celexa  Neuropathy/chronic pain--continue gabapentin, hydrocodone and morphine as needed.  Outpatient follow-up   DVT prophylaxis: SCDs Code Status: Full Family Communication: None at bedside Disposition Plan: Home in 1 to 2 days if clinically improved  Consultants:   Procedures:  ERCP on 03/31/2019 Impression:               - A medium amount of food (residue) in the stomach.                           - The major papilla appeared to be bulging.                           - Choledocholithiasis was found. Partial removal                            was accomplished with biliary sphincterotomy; a                            stent was inserted.                           - A biliary sphincterotomy was performed.                           - The biliary tree was swept. The basket  was also                            used both to remove stones and crushed stones                           - Common bile duct was successfully dilated.                           - One plastic stent was placed into the common bile                            duct. Moderate Sedation:      moderate sedation-none Recommendation:           - Clear liquid diet for 6 hours. Slowly advance as                            tolerated                           - Continue present medications.                           - Return to GI clinic PRN.                           - Telephone GI clinic if symptomatic PRN.                           - Repeat ERCP in 1 month to remove stent and                            proceed with spyglass and lithotripsy if needed and                            complete removal hopefully at that time.  Echo IMPRESSIONS    1. The left ventricle has  normal systolic function, with an ejection fraction of 55-60%. The cavity size was normal. Left ventricular diastolic Doppler parameters are consistent with impaired relaxation. No evidence of left ventricular regional wall  motion abnormalities.  2. The right ventricle has normal systolic function. The cavity was normal. There is no increase in right ventricular wall thickness.  3. There is mild mitral annular calcification present. No evidence of mitral valve stenosis. No significant regurgitation.  4. The aortic valve is tricuspid. Mild calcification of the aortic valve. No stenosis of the aortic valve. Cannot rule out a small vegetation on the aortic valve but not definite. Would suggest a TEE to further assess.  5. The aortic root is normal in size and structure.  6. The inferior vena cava was dilated in size with >50% respiratory variability. PA systolic pressure 34 mmHg.  Antimicrobials:  Anti-infectives (From admission, onward)   Start     Dose/Rate Route Frequency Ordered Stop   04/02/19 1800  ceFAZolin (ANCEF) IVPB 2g/100 mL premix     2 g 200 mL/hr over 30 Minutes Intravenous Every T-Th-Sa (1800) 04/01/19 0919     04/01/19 2100  ceFAZolin (ANCEF) IVPB 2g/100 mL premix     2 g 200 mL/hr over 30 Minutes Intravenous  Once 04/01/19 0919     03/27/19 2300  ceFEPIme (MAXIPIME) 1 g in sodium chloride 0.9 % 100 mL IVPB  Status:  Discontinued     1 g 200 mL/hr over 30 Minutes Intravenous Every 24 hours 03/26/19 2351 03/27/19 1638   03/27/19 1645  cefTRIAXone (ROCEPHIN) 2 g in sodium chloride 0.9 % 100 mL IVPB  Status:  Discontinued     2 g 200 mL/hr over 30 Minutes Intravenous Every 24 hours 03/27/19 1638 04/01/19 0919   03/26/19 2350  vancomycin variable dose per unstable renal function (pharmacist dosing)  Status:  Discontinued      Does not apply See admin instructions 03/26/19 2351 03/27/19 1000   03/26/19 2345  ceFEPIme (MAXIPIME) 2 g in sodium chloride 0.9 % 100 mL IVPB     2 g 200  mL/hr over 30 Minutes Intravenous  Once 03/26/19 2337 03/27/19 0038   03/26/19 2345  metroNIDAZOLE (FLAGYL) IVPB 500 mg     500 mg 100 mL/hr over 60 Minutes Intravenous  Once 03/26/19 2337 03/27/19 0149   03/26/19 2345  vancomycin (VANCOCIN) IVPB 1000 mg/200 mL premix  Status:  Discontinued     1,000 mg 200 mL/hr over 60 Minutes Intravenous  Once 03/26/19 2337 03/26/19 2339   03/26/19 2345  vancomycin (VANCOCIN) 2,000 mg in sodium chloride 0.9 % 500 mL IVPB     2,000 mg 250 mL/hr over 120 Minutes Intravenous  Once 03/26/19 2339 03/27/19 0504       Subjective: Patient seen and examined at bedside.  He complains of bloating.  Feels weak and tired and not ready to go home today.  No overnight fever or vomiting.  Objective: Vitals:   03/31/19 2232 04/01/19 0539 04/01/19 0608 04/01/19 0622  BP: (!) 116/59 (!) 89/54  122/68  Pulse: 78 71  70  Resp: 16 16    Temp: (!) 97.4 F (36.3 C) 97.8 F (36.6 C)    TempSrc: Oral Oral    SpO2: 94% 96%    Weight:   103.5 kg   Height:        Intake/Output Summary (Last 24 hours) at 04/01/2019 1055 Last data filed at 04/01/2019 0900 Gross per 24 hour  Intake 2027.34 ml  Output 2005 ml  Net 22.34 ml   Filed Weights   03/31/19 1620 03/31/19 1925 04/01/19 0608  Weight: 105.5 kg 102.9 kg 103.5 kg    Examination:  General exam: Appears calm and comfortable.  Poor historian. Respiratory system: Bilateral decreased breath sounds at bases with some scattered crackles Cardiovascular system: S1 & S2 heard, Rate controlled Gastrointestinal system: Abdomen is nondistended, soft and nontender. Normal bowel sounds heard. Extremities: No cyanosis, clubbing; lower extremity edema Central nervous system: Awake, poor historian.  No focal neurological deficits. Moving extremities Skin: No rashes, lesions or ulcers Psychiatry: Flat affect.  Intermittently gets upset/angry.    Data Reviewed: I have personally reviewed following labs and imaging  studies  CBC: Recent Labs  Lab 03/26/19 2230  03/29/19 0433 03/30/19 0353 03/30/19 1614 03/31/19 0422 04/01/19 0358  WBC 9.5   < > 7.0 6.9 7.7 7.2 9.3  NEUTROABS 7.2  --   --   --   --   --   --   HGB 12.5*   < > 10.9* 10.6* 10.7* 10.0* 10.4*  HCT 39.1   < > 33.7* 33.2* 32.9* 31.0* 32.0*  MCV 100.8*   < > 99.7 100.0 98.8 99.4 98.2  PLT 197   < > 190 203 199 199 194   < > = values in this interval not displayed.   Basic Metabolic Panel: Recent Labs  Lab 03/27/19 1002  03/29/19 0433 03/30/19 0353 03/30/19 1614 03/31/19 0422 04/01/19 0358  NA 139   < > 138 140 139 139 138  K 4.8   < > 5.9* 5.4* 4.2 4.5 4.3  CL 104   < > 105 103 99 100 98  CO2 24   < > 19* 20* 26 24 26   GLUCOSE 102*   < > 69* 72 85 114* 98  BUN 44*   < > 66* 66* 32* 37* 26*  CREATININE 7.77*   < > 11.67* 12.38* 7.68* 9.35* 7.08*  CALCIUM 8.5*   < > 8.6* 8.0* 8.2* 8.0* 7.9*  MG 1.9  --   --   --   --   --   --   PHOS 3.2  --   --   --  3.7  --   --    < > = values in this interval not displayed.   GFR: Estimated Creatinine Clearance: 12.3 mL/min (A) (by C-G formula based on SCr of 7.08 mg/dL (H)). Liver Function Tests: Recent Labs  Lab 03/26/19 2230 03/28/19 0323 03/30/19 0353 03/30/19 1614 03/31/19 0422 04/01/19 0358  AST 347* 86* 42*  --  48* 96*  ALT 416* 197* 102*  --  90* 115*  ALKPHOS 286* 200* 206*  --  253* 289*  BILITOT 3.9* 2.3* 1.0  --  1.1 0.9  PROT 7.0 6.4* 5.9*  --  6.6 6.8  ALBUMIN 3.2* 2.8* 2.7* 2.9* 2.8* 2.9*   No results for input(s): LIPASE, AMYLASE in the last 168 hours. No results for input(s): AMMONIA in the last 168 hours. Coagulation Profile: No results for input(s): INR, PROTIME in the last 168 hours. Cardiac Enzymes: No results for input(s): CKTOTAL, CKMB, CKMBINDEX, TROPONINI in the last 168 hours. BNP (last 3 results) No results for input(s): PROBNP in the last 8760 hours. HbA1C: No results for input(s): HGBA1C in the last 72 hours. CBG: Recent Labs  Lab  03/27/19 0501  GLUCAP 94   Lipid Profile: No results for input(s): CHOL, HDL, LDLCALC, TRIG, CHOLHDL, LDLDIRECT in the last 72 hours. Thyroid Function Tests: No results for input(s): TSH, T4TOTAL, FREET4, T3FREE, THYROIDAB in the last 72 hours. Anemia Panel: No results for input(s): VITAMINB12, FOLATE, FERRITIN, TIBC, IRON, RETICCTPCT in the last 72 hours. Sepsis Labs: Recent Labs  Lab 03/26/19 2235 03/27/19 0039 03/27/19 1002 03/28/19 0323  PROCALCITON  --  20.62  --   --   LATICACIDVEN 1.9  --  0.9 0.6    Recent Results (from the past 240 hour(s))  Culture, blood (routine x 2)     Status: None (Preliminary result)   Collection Time: 03/26/19 10:35 PM   Specimen: BLOOD  Result Value Ref Range Status   Specimen Description BLOOD LEFT ANTECUBITAL  Final   Special Requests   Final    BOTTLES DRAWN AEROBIC AND ANAEROBIC Blood Culture results may not be optimal due to an inadequate volume of blood received in culture bottles   Culture  Setup Time   Final    GRAM NEGATIVE RODS ANAEROBIC BOTTLE ONLY CRITICAL VALUE NOTED.  VALUE IS CONSISTENT WITH PREVIOUSLY REPORTED AND CALLED VALUE.    Culture   Final    GRAM NEGATIVE RODS  IDENTIFICATION TO FOLLOW Performed at [facility], [place] [address], [place], [place] [address]    Report Status PENDING  Incomplete  Culture, blood (routine x 2)     Status: Abnormal   Collection Time: 03/26/19 10:45 PM   Specimen: BLOOD LEFT HAND  Result Value Ref Range Status   Specimen Description BLOOD LEFT HAND  Final   Special Requests   Final    BOTTLES DRAWN AEROBIC AND ANAEROBIC Blood Culture results may not be optimal due to an inadequate volume of blood received in culture bottles   Culture  Setup Time   Final    GRAM NEGATIVE RODS IN BOTH AEROBIC AND ANAEROBIC BOTTLES Organism ID to follow CRITICAL RESULT CALLED TO, READ BACK BY AND VERIFIED WITH: Hughie Closs PharmD 16:05 03/27/19 ([email or web address]) Performed at [facility], [place]  [address], [place], [place] [address]    Culture ESCHERICHIA COLI (A)  Final   Report Status 03/29/2019 FINAL  Final   Organism ID, Bacteria ESCHERICHIA COLI  Final      Susceptibility   Escherichia coli - MIC*    AMPICILLIN <=2 SENSITIVE Sensitive     CEFAZOLIN <=4 SENSITIVE Sensitive     CEFEPIME <=1 SENSITIVE Sensitive     CEFTAZIDIME <=1 SENSITIVE Sensitive     CEFTRIAXONE <=1 SENSITIVE Sensitive     CIPROFLOXACIN <=0.25 SENSITIVE Sensitive     GENTAMICIN <=1 SENSITIVE Sensitive     IMIPENEM <=0.25 SENSITIVE Sensitive     TRIMETH/SULFA <=20 SENSITIVE Sensitive     AMPICILLIN/SULBACTAM <=2 SENSITIVE Sensitive     PIP/TAZO <=4 SENSITIVE Sensitive     Extended ESBL NEGATIVE Sensitive     * ESCHERICHIA COLI  Blood Culture ID Panel (Reflexed)     Status: Abnormal   Collection Time: 03/26/19 10:45 PM  Result Value Ref Range Status   Enterococcus species NOT DETECTED NOT DETECTED Final   Listeria monocytogenes NOT DETECTED NOT DETECTED Final   Staphylococcus species NOT DETECTED NOT DETECTED Final   Staphylococcus aureus (BCID) NOT DETECTED NOT DETECTED Final   Streptococcus species NOT DETECTED NOT DETECTED Final   Streptococcus agalactiae NOT DETECTED NOT DETECTED Final   Streptococcus pneumoniae NOT DETECTED NOT DETECTED Final   Streptococcus pyogenes NOT DETECTED NOT DETECTED Final   Acinetobacter baumannii NOT DETECTED NOT DETECTED Final   Enterobacteriaceae species DETECTED (A) NOT DETECTED Final    Comment: Enterobacteriaceae represent a large family of gram-negative bacteria, not a single organism. CRITICAL RESULT CALLED TO, READ BACK BY AND VERIFIED WITH: Hughie Closs PharmD 16:00 03/27/19 ([email or web address])    Enterobacter cloacae complex NOT DETECTED NOT DETECTED Final   Escherichia coli DETECTED (A) NOT DETECTED Final    Comment: CRITICAL RESULT CALLED TO, READ BACK BY AND VERIFIED WITH: Hughie Closs PharmD 16:00 03/27/19 ([email or web address])    Klebsiella oxytoca NOT DETECTED NOT DETECTED Final    Klebsiella pneumoniae NOT DETECTED NOT DETECTED Final   Proteus species NOT DETECTED NOT DETECTED Final   Serratia marcescens NOT DETECTED NOT DETECTED Final   Carbapenem resistance NOT DETECTED NOT DETECTED Final   Haemophilus influenzae NOT DETECTED NOT DETECTED Final   Neisseria meningitidis NOT DETECTED NOT DETECTED Final   Pseudomonas aeruginosa NOT DETECTED NOT DETECTED Final   Candida albicans NOT DETECTED NOT DETECTED Final   Candida glabrata NOT DETECTED NOT DETECTED Final   Candida krusei NOT DETECTED NOT DETECTED Final   Candida parapsilosis NOT DETECTED NOT DETECTED Final   Candida tropicalis NOT DETECTED NOT DETECTED Final  Comment: Performed at [facility], [place] [address] [place], [place] [address]  SARS Coronavirus 2 (CEPHEID - Performed in [facility] lab), Hosp Order     Status: None   Collection Time: 03/26/19 11:25 PM   Specimen: Nasopharyngeal Swab  Result Value Ref Range Status   SARS Coronavirus 2 NEGATIVE NEGATIVE Final    Comment: (NOTE) If result is NEGATIVE SARS-CoV-2 target nucleic acids are NOT DETECTED. The SARS-CoV-2 RNA is generally detectable in upper and lower  respiratory specimens during the acute phase of infection. The lowest  concentration of SARS-CoV-2 viral copies this assay can detect is 250  copies / mL. A negative result does not preclude SARS-CoV-2 infection  and should not be used as the sole basis for treatment or other  patient management decisions.  A negative result may occur with  improper specimen collection / handling, submission of specimen other  than nasopharyngeal swab, presence of viral mutation(s) within the  areas targeted by this assay, and inadequate number of viral copies  (<250 copies / mL). A negative result must be combined with clinical  observations, patient history, and epidemiological information. If result is POSITIVE SARS-CoV-2 target nucleic acids are DETECTED. The SARS-CoV-2 RNA is generally  detectable in upper and lower  respiratory specimens dur ing the acute phase of infection.  Positive  results are indicative of active infection with SARS-CoV-2.  Clinical  correlation with patient history and other diagnostic information is  necessary to determine patient infection status.  Positive results do  not rule out bacterial infection or co-infection with other viruses. If result is PRESUMPTIVE POSTIVE SARS-CoV-2 nucleic acids MAY BE PRESENT.   A presumptive positive result was obtained on the submitted specimen  and confirmed on repeat testing.  While 2019 novel coronavirus  (SARS-CoV-2) nucleic acids may be present in the submitted sample  additional confirmatory testing may be necessary for epidemiological  and / or clinical management purposes  to differentiate between  SARS-CoV-2 and other Sarbecovirus currently known to infect humans.  If clinically indicated additional testing with an alternate test  methodology [phone]) is advised. The SARS-CoV-2 RNA is generally  detectable in upper and lower respiratory sp ecimens during the acute  phase of infection. The expected result is Negative. Fact Sheet for Patients:  StrictlyIdeas.no Fact Sheet for Healthcare Providers: BankingDealers.co.za This test is not yet approved or cleared by the [place] FDA and has been authorized for detection and/or diagnosis of SARS-CoV-2 by FDA under an Emergency Use Authorization (EUA).  This EUA will remain in effect (meaning this test can be used) for the duration of the COVID-19 declaration under Section 564(b)(1) of the Act, 21 U.S.C. section 360bbb-3(b)(1), unless the authorization is terminated or revoked sooner. Performed at [facility], [place] [address], [place], [place] [address]   MRSA PCR Screening     Status: None   Collection Time: 03/27/19  5:03 AM   Specimen: Nasal Mucosa; Nasopharyngeal  Result Value Ref Range  Status   MRSA by PCR NEGATIVE NEGATIVE Final    Comment:        The GeneXpert MRSA Assay (FDA approved for NASAL specimens only), is one component of a comprehensive MRSA colonization surveillance program. It is not intended to diagnose MRSA infection nor to guide or monitor treatment for MRSA infections. Performed at [facility], [place] [address], [place], [place] [address]   C difficile quick scan w PCR reflex     Status: None  Collection Time: 03/27/19  9:32 AM   Specimen: STOOL  Result Value Ref Range Status   C Diff antigen NEGATIVE NEGATIVE Final   C Diff toxin NEGATIVE NEGATIVE Final   C Diff interpretation No C. difficile detected.  Final    Comment: Performed at [facility], [place] [address], [place], [place] [address]  Novel Coronavirus, NAA (hospital order; send-out to ref lab)     Status: None   Collection Time: 03/30/19  6:34 PM   Specimen: Nasopharyngeal Swab; Respiratory  Result Value Ref Range Status   SARS-CoV-2, NAA NOT DETECTED NOT DETECTED Final    Comment: (NOTE) This test was developed and its performance characteristics determined by [employer]. This test has not been FDA cleared or approved. This test has been authorized by FDA under an Emergency Use Authorization (EUA). This test is only authorized for the duration of time the declaration that circumstances exist justifying the authorization of the emergency use of in vitro diagnostic tests for detection of SARS-CoV-2 virus and/or diagnosis of COVID-19 infection under section 564(b)(1) of the Act, 21 U.S.C. 656CLE-7(N)(1), unless the authorization is terminated or revoked sooner. When diagnostic testing is negative, the possibility of a false negative result should be considered in the context of a patient's recent exposures and the presence of clinical signs and symptoms consistent with COVID-19. An individual without symptoms of COVID-19 and who is not shedding SARS-CoV-2 virus  would expect to have a negative (not detected) result in this assay. Performed  At: [facility] [address] [place], [place] [id number] Rush Farmer MD HQ:[id number]    [place]  Final    Comment: Performed at [facility], [place] [address], [place],  [address]         Radiology Studies: Dg Ercp Biliary & Pancreatic Ducts  Result Date: 03/31/2019 CLINICAL DATA:  ERCP for common bile duct stone. EXAM: ERCP TECHNIQUE: Multiple spot images obtained with the fluoroscopic device and submitted for interpretation post-procedure. COMPARISON:  MRCP-03/29/2019 FLUOROSCOPY TIME:  3 minutes, 46 seconds FINDINGS: Five spot intraoperative fluoroscopic images of the right upper abdominal quadrant during ERCP are provided for review Initial image demonstrates an ERCP probe overlying the right upper abdominal quadrant. Cholecystectomy clips overlies expected location of gallbladder fossa. There is faint opacification of the moderate to markedly dilated common bile duct with multiple ill-defined filling defects within the CBD worrisome for extensive choledocholithiasis as demonstrated on preceding MRCP. Subsequent images demonstrate insufflation of a balloon within the mid/distal aspect of the CBD. Subsequent images demonstrate insufflation of a balloon within the central aspect of the CBD with subsequent presumed biliary sweeping and sphincterotomy. Completion image demonstrates placement of an internal plastic biliary stent with distal tip overlying the descending portion of the duodenum in superior tip overlying the superior/mid aspect the CBD. IMPRESSION: ERCP with findings suggestive of extensive cholelithiasis with subsequent biliary sweeping / sphincterotomy and internal biliary stent placement as above. These images were submitted for radiologic interpretation only. Please see the procedural report for the amount of contrast and the fluoroscopy time utilized.  Electronically Signed   By: Sandi Mariscal M.D.   On: 03/31/2019 13:32        Scheduled Meds:  allopurinol  100 mg Oral Daily   aspirin EC  325 mg Oral Daily   Chlorhexidine Gluconate Cloth  6 each Topical Q0600   cinacalcet  90 mg Oral BID WC   citalopram  10 mg Oral QHS   colestipol  3 g Oral Daily   feeding supplement (NEPRO CARB STEADY)  237 mL Oral BID BM   gabapentin  600 mg Oral QHS   heparin  5,000 Units Subcutaneous Q8H   methimazole  2.5 mg Oral Daily   midodrine  10 mg Oral TID WC   pantoprazole  40 mg Oral QHS   Continuous Infusions:  sodium chloride Stopped (04/01/19 0124)   sodium chloride     sodium chloride     sodium chloride     sodium chloride      ceFAZolin (ANCEF) IV     [START ON 04/02/2019]  ceFAZolin (ANCEF) IV     lactated ringers Stopped (03/27/19 1610)     LOS: 5 days        Aline August, MD [facility] 04/01/2019, 10:55 AM

## 2019-04-01 NOTE — Care Management Important Message (Signed)
Important Message  Patient Details  Name: [name] MRN: [id number] Date of Birth: June 08, 1951   Medicare Important Message Given:  Yes     Orbie Pyo 04/01/2019, 2:58 PM

## 2019-04-01 NOTE — [place] Notes (Signed)
Pharmacy note - antimicrobial stewardship  Bloodstream isolate showing E.coli which is sensitive to all antibiotics tested.  Patient is currently in ceftriaxone and is improving clinically.    Contacted Dr. Starla Link about narrowing to cefazolin which can be given three times weekly after hemodialysis.  Orders received.  Antibiotics Vanc 6/26 >> 6/26 Cefepime 6/25 >>6/26  Ceftriaxone 6/26>7/1 Cefazolin 7/1>>  Microbiology 6/25 covid - negative 6/25 BCx - Ecoli - pan sensitive  6/26 MRSA PCR - negative 6/26 C.diff - negative  Heide Guile, PharmD, BCPS-AQ ID Clinical Pharmacist Pager [phone]

## 2019-04-01 NOTE — [place] Notes (Signed)
    [facility] has been requested to perform a transesophageal echocardiogram on [name] for bacteremia and suspected aortic valve vegetation.  After careful review of history and examination, the risks and benefits of transesophageal echocardiogram have been explained including risks of esophageal damage, perforation (1:10,000 risk), bleeding, pharyngeal hematoma as well as other potential complications associated with conscious sedation including aspiration, arrhythmia, respiratory failure and death. Alternatives to treatment were discussed, questions were answered. Patient is willing to proceed.   Cecilie Kicks, NP  04/01/2019 3:27 PM

## 2019-04-01 NOTE — Progress Notes (Addendum)
[facility] Progress Note   Subjective: Concerned about whether of not all stones have been removed, asking to speak to Dr. Watt Climes. Otherwise no specific complaints.      Objective Vitals:   03/31/19 2232 04/01/19 0539 04/01/19 0608 04/01/19 0622  BP: (!) 116/59 (!) 89/54  122/68  Pulse: 78 71  70  Resp: 16 16    Temp: (!) 97.4 F (36.3 C) 97.8 F (36.6 C)    TempSrc: Oral Oral    SpO2: 94% 96%    Weight:   103.5 kg   Height:        Physical Exam General:Pleasant older male in NAD Heart:S1,S2 RRR Lungs:CTAB A/P Uses CPAP Abdomen:soft, NT Extremities:No LE edema, edema noted in hands Dialysis Access:R AVF + bruit    Additional Objective Labs: Basic Metabolic Panel: Recent Labs  Lab 03/27/19 1002  03/30/19 1614 03/31/19 0422 04/01/19 0358  NA 139   < > 139 139 138  K 4.8   < > 4.2 4.5 4.3  CL 104   < > 99 100 98  CO2 24   < > _0 GLUCOSE 102*   < > 85 114* 98  BUN 44*   < > 32* 37* 26*  CREATININE 7.77*   < > 7.68* 9.35* 7.08*  CALCIUM 8.5*   < > 8.2* 8.0* 7.9*  PHOS 3.2  --  3.7  --   --    < > = values in this interval not displayed.   Liver Function Tests: Recent Labs  Lab 03/30/19 0353 03/30/19 1614 03/31/19 0422 04/01/19 0358  AST 42*  --  48* 96*  ALT 102*  --  90* 115*  ALKPHOS 206*  --  253* 289*  BILITOT 1.0  --  1.1 0.9  PROT 5.9*  --  6.6 6.8  ALBUMIN 2.7* 2.9* 2.8* 2.9*   No results for input(s): LIPASE, AMYLASE in the last 168 hours. CBC: Recent Labs  Lab 03/26/19 2230  03/29/19 0433 03/30/19 0353 03/30/19 1614 03/31/19 0422 04/01/19 0358  WBC 9.5   < > 7.0 6.9 7.7 7.2 9.3  NEUTROABS 7.2  --   --   --   --   --   --   HGB 12.5*   < > 10.9* 10.6* 10.7* 10.0* 10.4*  HCT 39.1   < > 33.7* 33.2* 32.9* 31.0* 32.0*  MCV 100.8*   < > 99.7 100.0 98.8 99.4 98.2  PLT 197   < > 190 203 199 199 194   < > = values in this interval not displayed.   Blood Culture    Component Value Date/Time   SDES BLOOD LEFT  HAND 03/26/2019 2245   SPECREQUEST  03/26/2019 2245    BOTTLES DRAWN AEROBIC AND ANAEROBIC Blood Culture results may not be optimal due to an inadequate volume of blood received in culture bottles   CULT ESCHERICHIA COLI (A) 03/26/2019 2245   REPTSTATUS 03/29/2019 FINAL 03/26/2019 2245    Cardiac Enzymes: No results for input(s): CKTOTAL, CKMB, CKMBINDEX, TROPONINI in the last 168 hours. CBG: Recent Labs  Lab 03/27/19 0501  GLUCAP 94   Iron Studies: No results for input(s): IRON, TIBC, TRANSFERRIN, FERRITIN in the last 72 hours. _1 @ Studies/Results: Dg Ercp Biliary & Pancreatic Ducts  Result Date: 03/31/2019 CLINICAL DATA:  ERCP for common bile duct stone. EXAM: ERCP TECHNIQUE: Multiple spot images obtained with the fluoroscopic device and submitted for interpretation post-procedure. COMPARISON:  MRCP-03/29/2019 FLUOROSCOPY TIME:  3  minutes, 46 seconds FINDINGS: Five spot intraoperative fluoroscopic images of the right upper abdominal quadrant during ERCP are provided for review Initial image demonstrates an ERCP probe overlying the right upper abdominal quadrant. Cholecystectomy clips overlies expected location of gallbladder fossa. There is faint opacification of the moderate to markedly dilated common bile duct with multiple ill-defined filling defects within the CBD worrisome for extensive choledocholithiasis as demonstrated on preceding MRCP. Subsequent images demonstrate insufflation of a balloon within the mid/distal aspect of the CBD. Subsequent images demonstrate insufflation of a balloon within the central aspect of the CBD with subsequent presumed biliary sweeping and sphincterotomy. Completion image demonstrates placement of an internal plastic biliary stent with distal tip overlying the descending portion of the duodenum in superior tip overlying the superior/mid aspect the CBD. IMPRESSION: ERCP with findings suggestive of extensive cholelithiasis with subsequent biliary  sweeping / sphincterotomy and internal biliary stent placement as above. These images were submitted for radiologic interpretation only. Please see the procedural report for the amount of contrast and the fluoroscopy time utilized. Electronically Signed   By: Sandi Mariscal M.D.   On: 03/31/2019 13:32   Medications: . sodium chloride Stopped (04/01/19 0124)  . sodium chloride    . sodium chloride    . sodium chloride    . sodium chloride    .  ceFAZolin (ANCEF) IV    . [START ON 04/02/2019]  ceFAZolin (ANCEF) IV    . lactated ringers Stopped (03/27/19 1610)   . allopurinol  100 mg Oral Daily  . aspirin EC  325 mg Oral Daily  . Chlorhexidine Gluconate Cloth  6 each Topical Q0600  . cinacalcet  90 mg Oral BID WC  . citalopram  10 mg Oral QHS  . colestipol  3 g Oral Daily  . feeding supplement (NEPRO CARB STEADY)  237 mL Oral BID BM  . gabapentin  600 mg Oral QHS  . heparin  5,000 Units Subcutaneous Q8H  . methimazole  2.5 mg Oral Daily  . midodrine  10 mg Oral TID WC  . pantoprazole  40 mg Oral QHS     Dialysis Orders: AF TTS  4:15 h 101kg 2/2.25 bath AVF 450/800Hep none no ESA or Fe  hectorol 54mg IV TIW Recent labs: hgb 12.9 33% sat Ca/P ok K 5s iPTH 378   Assessment/Plan: 1. Sepsis due to E coli bacteremia.- ^ procalcitonin- on Vanc, Maxipime Echo showed mild calcification of the Ao value - could not r/o small vegetation - suggested TEE to further r/o. r/o Cdiff, COVID neg. 2. Elevated LFTs suggestive of shock liver AST 347 ALT 416 alk phos 285 but total bili 3.9(last outpt alk phos was 133 12/2018 - no outpatient LFTs since last hospitalization . Repeat labs 6/27 show marked improved in LFTs and decline in T bili to 2.3. Episodes of Kleb pneuo in the past year related to biliary issues - per primary 3. ERCP today. Choledocholithiasisi-partial removal with biliary sphincterotomy-stent inserted. Common bile duct dilated, biliary tree swept.   4. ESRD- TTS-Next HD  04/02/2019, hopefully to OP Clinic.  5. Hypertension/volume- chronic intra dialytic hypotension during HD treatments even when not ill. UFG 2.5 liters today. Midodrine prior to HD. BP stable at present. Uses UFP 2 D/T early sign off-attempt at OP center. HD 06/30 Pre wt 105.5 Net UF 2.0 liters Post wt 102.9 kg. Still above OP EDW. Continue lowering EDW.  6. Anemia-HGB 10.4 Follow HGB.  7. Metabolic bone disease- Continue Binders/VDRA 8. Nutrition- diet +  supplements/vitamins  Disposition: Stable from renal stand point for DC home. Discussed with primary.   Malva Diesing H. Latash Nouri NP-C 04/01/2019, 10:33 AM  [employer] [phone]

## 2019-04-01 NOTE — Progress Notes (Signed)
[name] 12:41 PM  Subjective: Patient did well from his procedure which we extensively discussed and other than a little bloating today he has no complaints  Objective: Vital signs stable afebrile no acute distress abdomen is soft nontender labs stable  Assessment: CBD stones recurrent since 2011 which we discussed  Plan: Okay to go home from my standpoint we will set up a telemetry visit in 1 to 2 weeks to answer all of his and his wife's residual questions even though we answered all of his today and set up a repeat ERCP and hopefully stone removal with or without spyglass and lithotripsy as we discussed  [facility]  office [phone] After 5PM or if no answer call [phone]

## 2019-04-01 NOTE — Progress Notes (Signed)
Spoke to pt's wife. Updated her on pt condition and plan of care. Answered all questions to satisfaction.

## 2019-04-01 NOTE — Progress Notes (Signed)
CPAP setup with water added at bedside and ready for patient to place on. States he will place himself on later if he wears it tonight. Informed him to call if eh has any issues I would be available to help all night.

## 2019-04-01 NOTE — H&P (View-Only) (Signed)
[name] 12:41 PM  Subjective: Patient did well from his procedure which we extensively discussed and other than a little bloating today he has no complaints  Objective: Vital signs stable afebrile no acute distress abdomen is soft nontender labs stable  Assessment: CBD stones recurrent since 2011 which we discussed  Plan: Okay to go home from my standpoint we will set up a telemetry visit in 1 to 2 weeks to answer all of his and his wife's residual questions even though we answered all of his today and set up a repeat ERCP and hopefully stone removal with or without spyglass and lithotripsy as we discussed  [facility]  office [phone] After 5PM or if no answer call [phone]

## 2019-04-01 NOTE — Progress Notes (Signed)
PT Cancellation Note  Patient Details Name: [name] MRN: [id number] DOB: 09-01-1951   Cancelled Treatment:    Reason Eval/Treat Not Completed: Patient declined, no reason specified Pt reports he does not want to work with PT and reports "I just don't feel like it." Will follow up as schedule allows.   Leighton Ruff, PT, DPT  Acute Rehabilitation Services  Pager: [phone] Office: [phone]    [name] 04/01/2019, 2:02 PM

## 2019-04-02 ENCOUNTER — Inpatient Hospital Stay (HOSPITAL_COMMUNITY): Payer: Medicare Other

## 2019-04-02 ENCOUNTER — Other Ambulatory Visit: Payer: Self-pay | Admitting: Pharmacist

## 2019-04-02 ENCOUNTER — Other Ambulatory Visit: Payer: Self-pay | Admitting: *Deleted

## 2019-04-02 ENCOUNTER — Encounter (HOSPITAL_COMMUNITY)
Admission: EM | Disposition: A | Discharge status: Home / Self Care | Payer: Self-pay | Source: Home / Self Care | Attending: Internal Medicine

## 2019-04-02 ENCOUNTER — Encounter (HOSPITAL_COMMUNITY): Payer: Self-pay | Admitting: *Deleted

## 2019-04-02 DIAGNOSIS — I361 Nonrheumatic tricuspid (valve) insufficiency: Secondary | ICD-10-CM

## 2019-04-02 DIAGNOSIS — R7881 Bacteremia: Secondary | ICD-10-CM

## 2019-04-02 HISTORY — PX: TEE WITHOUT CARDIOVERSION: SHX5443

## 2019-04-02 LAB — RENAL FUNCTION PANEL
Albumin: 2.8 g/dL — ABNORMAL LOW (ref 3.5–5.0)
Anion gap: 14 (ref 5–15)
BUN: 18 mg/dL (ref 8–23)
CO2: 25 mmol/L (ref 22–32)
Calcium: 7.9 mg/dL — ABNORMAL LOW (ref 8.9–10.3)
Chloride: 101 mmol/L (ref 98–111)
Creatinine, Ser: 5.16 mg/dL — ABNORMAL HIGH (ref 0.61–1.24)
GFR calc Af Amer: 12 mL/min — ABNORMAL LOW (ref >60)
GFR calc non Af Amer: 10 mL/min — ABNORMAL LOW (ref >60)
Glucose, Bld: 106 mg/dL — ABNORMAL HIGH (ref 70–99)
Phosphorus: 3.2 mg/dL (ref 2.5–4.6)
Potassium: 4.1 mmol/L (ref 3.5–5.1)
Sodium: 140 mmol/L (ref 135–145)

## 2019-04-02 LAB — BASIC METABOLIC PANEL
Anion gap: 16 — ABNORMAL HIGH (ref 5–15)
BUN: 40 mg/dL — ABNORMAL HIGH (ref 8–23)
CO2: 25 mmol/L (ref 22–32)
Calcium: 7.6 mg/dL — ABNORMAL LOW (ref 8.9–10.3)
Chloride: 98 mmol/L (ref 98–111)
Creatinine, Ser: 9.23 mg/dL — ABNORMAL HIGH (ref 0.61–1.24)
GFR calc Af Amer: 6 mL/min — ABNORMAL LOW (ref >60)
GFR calc non Af Amer: 5 mL/min — ABNORMAL LOW (ref >60)
Glucose, Bld: 120 mg/dL — ABNORMAL HIGH (ref 70–99)
Potassium: 4.1 mmol/L (ref 3.5–5.1)
Sodium: 139 mmol/L (ref 135–145)

## 2019-04-02 LAB — CBC WITH DIFFERENTIAL/PLATELET
Abs Immature Granulocytes: 0.25 10*3/uL — ABNORMAL HIGH (ref 0.00–0.07)
Basophils Absolute: 0.1 10*3/uL (ref 0.0–0.1)
Basophils Relative: 1 %
Eosinophils Absolute: 0.3 10*3/uL (ref 0.0–0.5)
Eosinophils Relative: 3 %
HCT: 30.4 % — ABNORMAL LOW (ref 39.0–52.0)
Hemoglobin: 10 g/dL — ABNORMAL LOW (ref 13.0–17.0)
Immature Granulocytes: 3 %
Lymphocytes Relative: 20 %
Lymphs Abs: 1.7 10*3/uL (ref 0.7–4.0)
MCH: 32.3 pg (ref 26.0–34.0)
MCHC: 32.9 g/dL (ref 30.0–36.0)
MCV: 98.1 fL (ref 80.0–100.0)
Monocytes Absolute: 1.2 10*3/uL — ABNORMAL HIGH (ref 0.1–1.0)
Monocytes Relative: 14 %
Neutro Abs: 4.9 10*3/uL (ref 1.7–7.7)
Neutrophils Relative %: 59 %
Platelets: 224 10*3/uL (ref 150–400)
RBC: 3.1 MIL/uL — ABNORMAL LOW (ref 4.22–5.81)
RDW: 16.2 % — ABNORMAL HIGH (ref 11.5–15.5)
WBC: 8.3 10*3/uL (ref 4.0–10.5)
nRBC: 0.4 % — ABNORMAL HIGH (ref 0.0–0.2)

## 2019-04-02 LAB — MAGNESIUM: Magnesium: 2.1 mg/dL (ref 1.7–2.4)

## 2019-04-02 IMAGING — CT CT HEAD W/O CM
3 series · 15 of 47 positions shown, 18 images · non-contrast
Comparison: Head CT 10/16/2016

CLINICAL DATA: Altered mental status. Fall getting up from
recliner.

EXAM:
CT HEAD WITHOUT CONTRAST
TECHNIQUE: Contiguous axial images were obtained from the base of the skull
through the vertex without intravenous contrast.

[Series 3: head 5.0 h30s · axial · 0.46mm/px · z∈[-113,+22]mm · 9 of 33 slices shown, 12 images]
[im 3/33  brain]
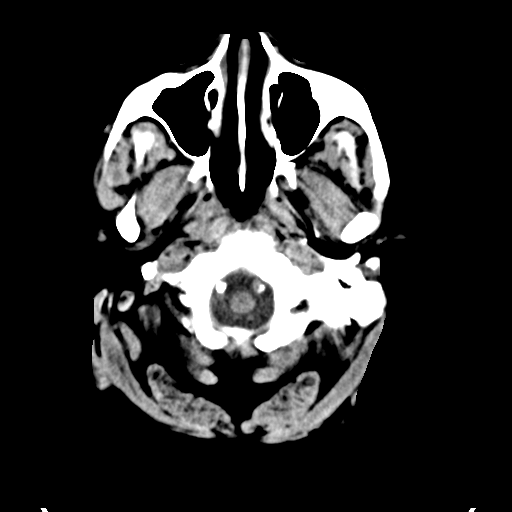
[im 3/33  bone]
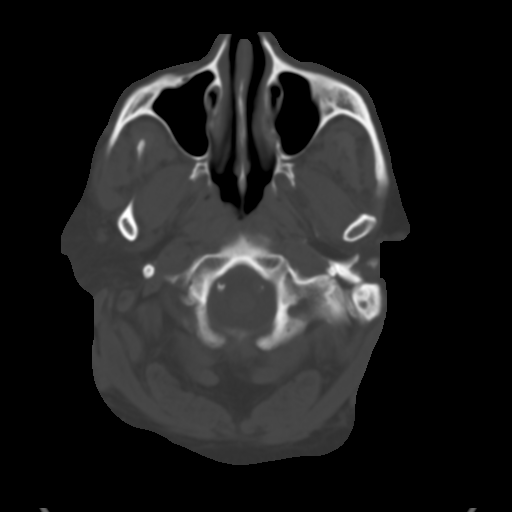
[im 6/33  brain]
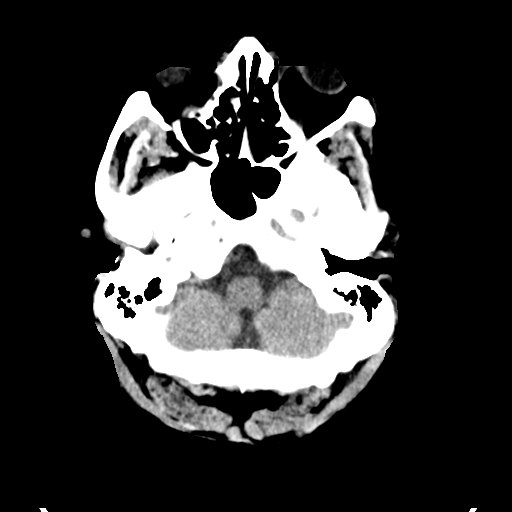
[im 9/33  brain]
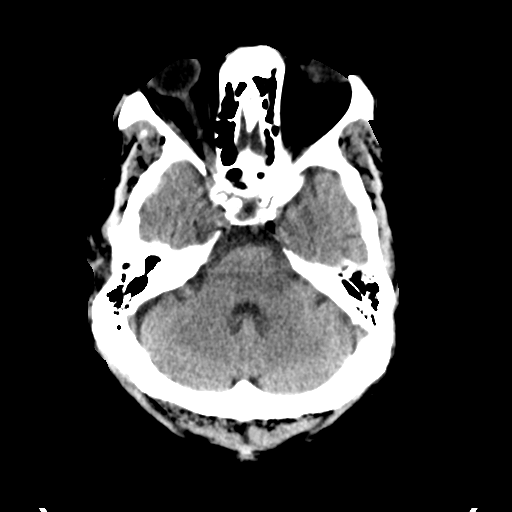
[im 13/33  brain]
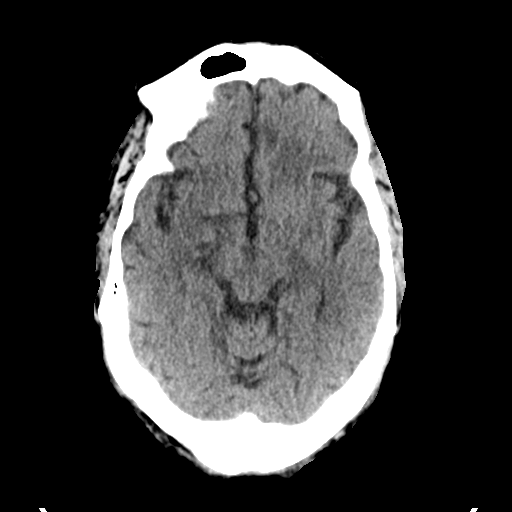
[im 17/33  brain]
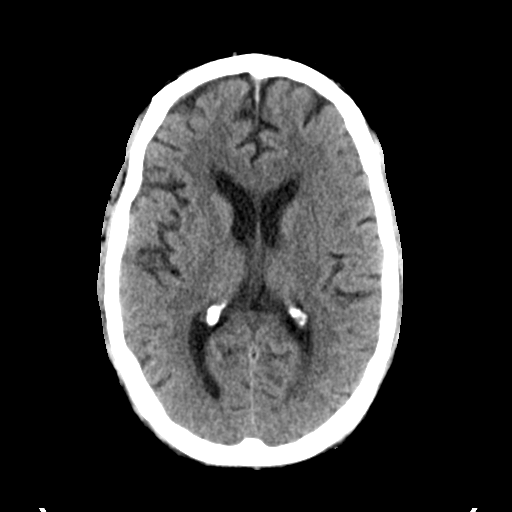
[im 17/33  bone]
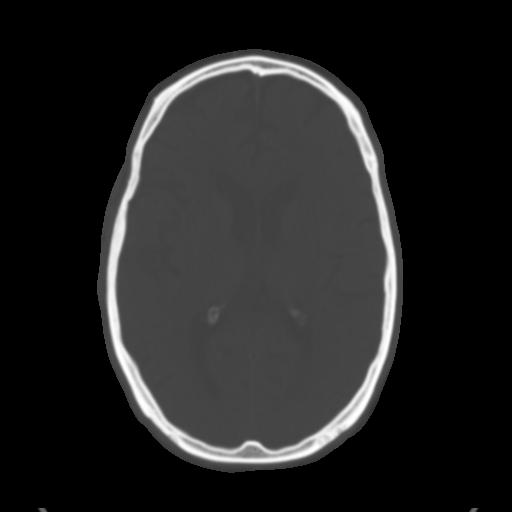
[im 20/33  brain]
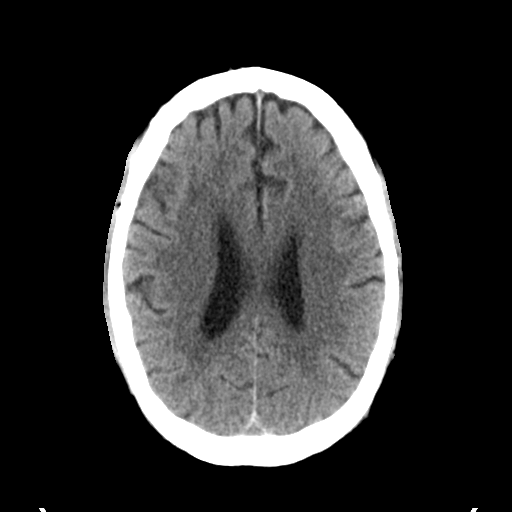
[im 24/33  brain]
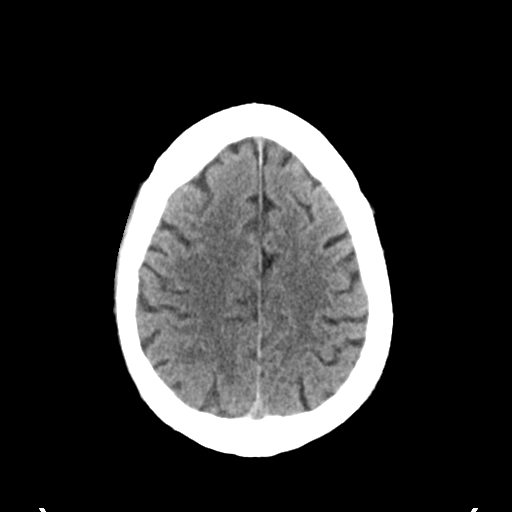
[im 27/33  brain]
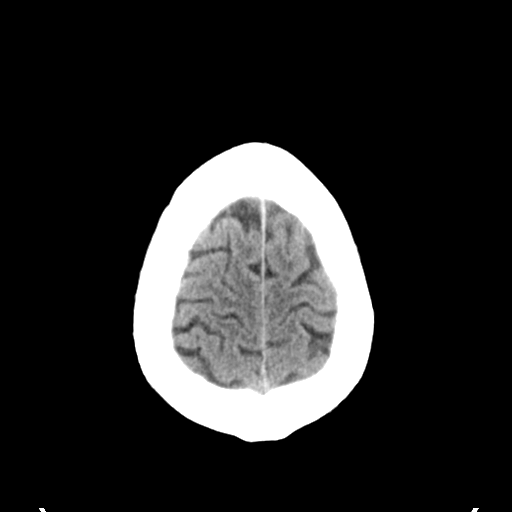
[im 30/33  brain]
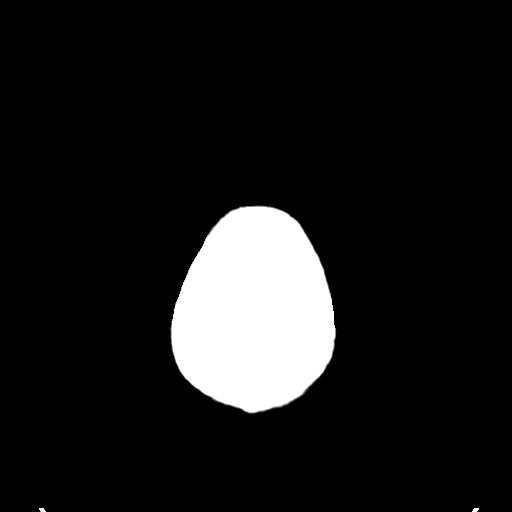
[im 30/33  bone]
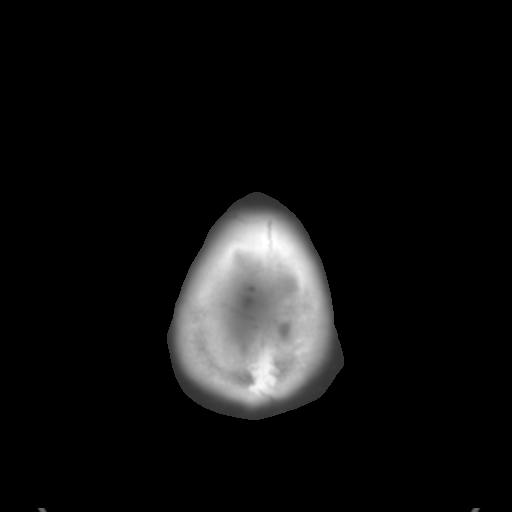

[Series 5: head 3.0 mpr cor · coronal · 0.32mm/px · 3 of 72 slices shown]
[im 24/72  brain]
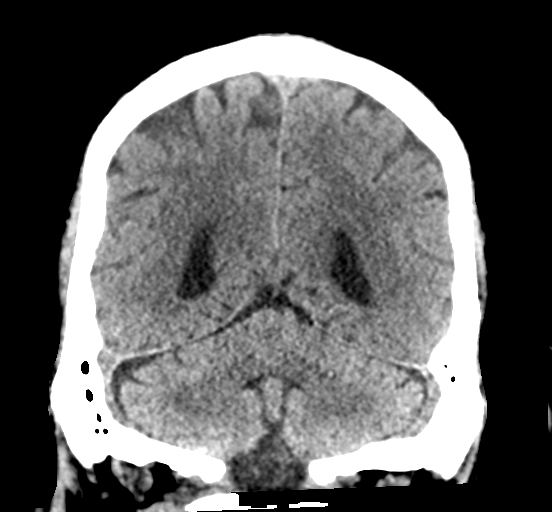
[im 32/72  brain]
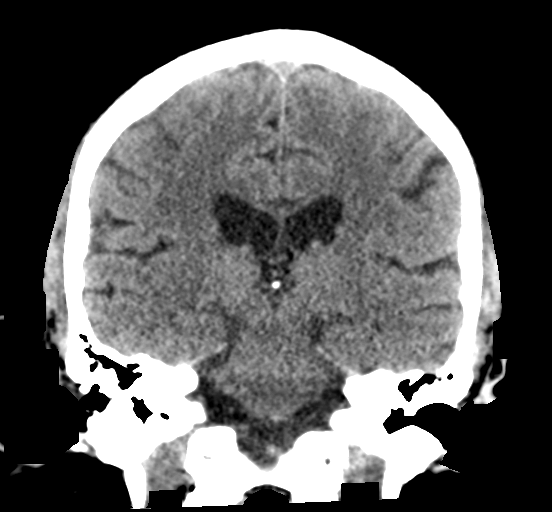
[im 40/72  brain]
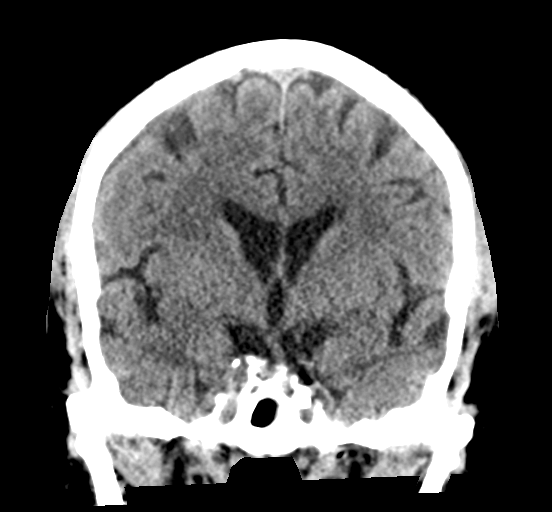

[Series 6: head 3.0 mpr sag · sagittal · 0.32mm/px · 3 of 58 slices shown]
[im 20/58  brain]
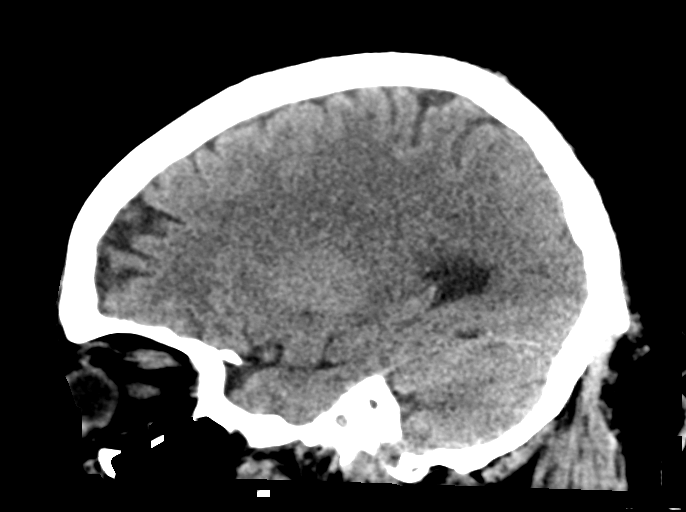
[im 29/58  brain]
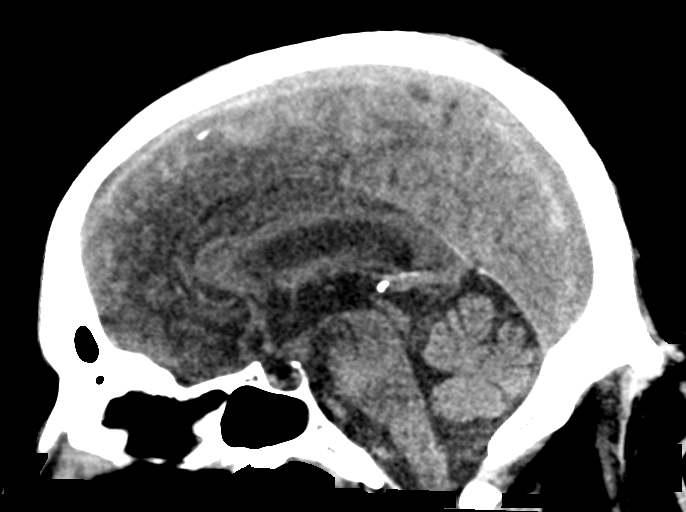
[im 39/58  brain]
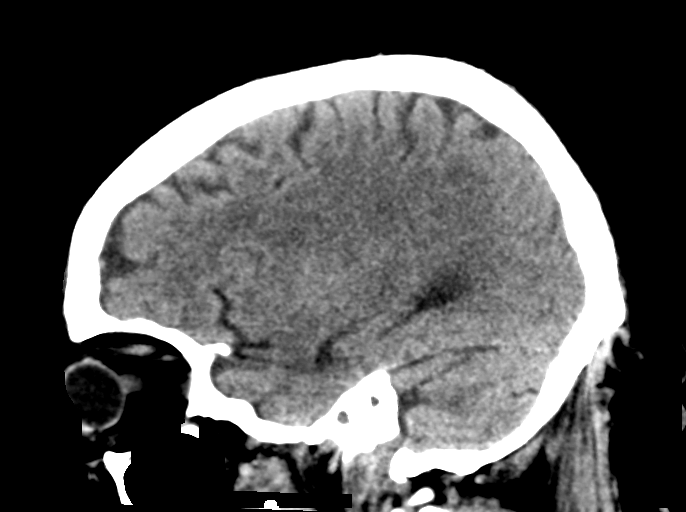

[15 of 47 positions shown; findings below may reference images not displayed]

FINDINGS: Brain: Age related atrophy. Mild chronic small vessel ischemia. No
intracranial hemorrhage, mass effect, or midline shift. No
hydrocephalus. The basilar cisterns are patent. No evidence of
territorial infarct or acute ischemia. No extra-axial or
intracranial fluid collection.

Vascular: Atherosclerosis of skullbase vasculature without
hyperdense vessel or abnormal calcification.

Skull: No fracture or focal lesion.

Sinuses/Orbits: Paranasal sinuses and mastoid air cells are clear.
The visualized orbits are unremarkable.

Other: None.
IMPRESSION: No acute intracranial abnormality.

## 2019-04-02 SURGERY — ECHOCARDIOGRAM, TRANSESOPHAGEAL
Anesthesia: Moderate Sedation

## 2019-04-02 MED ORDER — SODIUM CHLORIDE 0.9 % IV SOLN: INTRAVENOUS | Status: DC

## 2019-04-02 MED ORDER — BISMUTH SUBSALICYLATE 262 MG/15ML PO SUSP
30.0000 mL | Freq: Three times a day (TID) | ORAL | Status: DC
  Administered 2019-04-02: 30 mL via ORAL
  Filled 2019-04-02: qty 236

## 2019-04-02 MED ORDER — POLYETHYLENE GLYCOL 3350 17 G PO PACK: 17.0000 g | PACK | Freq: Every day | ORAL | Status: AC | PRN

## 2019-04-02 MED ORDER — MIDAZOLAM HCL (PF) 5 MG/ML IJ SOLN
INTRAMUSCULAR | Status: AC
  Filled 2019-04-02: qty 1

## 2019-04-02 MED ORDER — SODIUM CHLORIDE 0.9 % IV SOLN: 100.0000 mL | INTRAVENOUS | Status: DC | PRN

## 2019-04-02 MED ORDER — CEPHALEXIN 500 MG PO CAPS: 500.0000 mg | ORAL_CAPSULE | Freq: Two times a day (BID) | ORAL | 0 refills | Status: AC

## 2019-04-02 MED ORDER — FENTANYL CITRATE (PF) 100 MCG/2ML IJ SOLN
INTRAMUSCULAR | Status: AC
  Filled 2019-04-02: qty 2

## 2019-04-02 MED ORDER — FENTANYL CITRATE (PF) 100 MCG/2ML IJ SOLN
INTRAMUSCULAR | Status: DC | PRN
  Administered 2019-04-02 (×3): 25 ug via INTRAVENOUS

## 2019-04-02 MED ORDER — SODIUM CHLORIDE 0.9 % IV SOLN
INTRAVENOUS | Status: AC | PRN
  Administered 2019-04-02: 500 mL via INTRAVENOUS

## 2019-04-02 MED ORDER — LOPERAMIDE HCL 2 MG PO CAPS: 2.0000 mg | ORAL_CAPSULE | Freq: Two times a day (BID) | ORAL | 0 refills | Status: DC | PRN

## 2019-04-02 MED ORDER — MIDODRINE HCL 10 MG PO TABS: 10.0000 mg | ORAL_TABLET | Freq: Three times a day (TID) | ORAL | 0 refills | Status: DC

## 2019-04-02 MED ORDER — MIDAZOLAM HCL (PF) 10 MG/2ML IJ SOLN
INTRAMUSCULAR | Status: DC | PRN
  Administered 2019-04-02 (×4): 2 mg via INTRAVENOUS

## 2019-04-02 MED ORDER — BUTAMBEN-TETRACAINE-BENZOCAINE 2-2-14 % EX AERO
INHALATION_SPRAY | CUTANEOUS | Status: DC | PRN
  Administered 2019-04-02: 2 via TOPICAL

## 2019-04-02 MED ORDER — MIDODRINE HCL 5 MG PO TABS
ORAL_TABLET | ORAL | Status: AC
  Filled 2019-04-02: qty 2

## 2019-04-02 MED ORDER — TRAMADOL HCL 50 MG PO TABS: 50.0000 mg | ORAL_TABLET | Freq: Four times a day (QID) | ORAL | 0 refills | Status: DC | PRN

## 2019-04-02 NOTE — CV Procedure (Signed)
TEE: See full report in [facility]  AV sclerosis no vegetation  No SBE  Jenkins Rouge

## 2019-04-02 NOTE — Progress Notes (Signed)
  Echocardiogram  Echocardiogram Transesophageal has been performed.  [name] 04/02/2019, 12:08 PM

## 2019-04-02 NOTE — Progress Notes (Signed)
Patient seen in endoscopy and doing well without GI complaints and his TEE discussed with cardiology and we reviewed our plans for outpatient follow-up in a week or 2 and to set up repeat ERCP

## 2019-04-02 NOTE — Progress Notes (Signed)
Patient currently off floor in dialysis.

## 2019-04-02 NOTE — Progress Notes (Signed)
Patient repeatedly c/o "something's not right" with abd. Patient c/o abd pain, but then denies that it is pain. Patient states that abd feels like it is going to burst, like he is constipated. Patient does not believe that he is constipated, though, since he has multiple BMs per day. Patient does not want pain medication at this time. Will continue to monitor.

## 2019-04-02 NOTE — Progress Notes (Addendum)
PT Cancellation Note  Patient Details Name: [name] MRN: [id number] DOB: [birth year]   Cancelled Treatment:    Reason Eval/Treat Not Completed: Patient at procedure or test/unavailable  Patient currently in HD. Noted PT ordered for imminent discharge. Will see later today.  Addendum (1431)-Patient has already received d/c instructions. Spoke with his RN re: ?any reason for PT to evaluate. She agreed ok to defer PT evaluation at this time.  Jeanie Cooks Arsh Feutz, PT 04/02/2019, 8:10 AM

## 2019-04-02 NOTE — Interval H&P Note (Signed)
History and Physical Interval Note:  04/02/2019 11:22 AM  [name]  has presented today for surgery, with the diagnosis of BACTEREMIA.  The various methods of treatment have been discussed with the patient and family. After consideration of risks, benefits and other options for treatment, the patient has consented to  Procedure(s): TRANSESOPHAGEAL ECHOCARDIOGRAM (TEE) (N/A) as a surgical intervention.  The patient's history has been reviewed, patient examined, no change in status, stable for surgery.  I have reviewed the patient's chart and labs.  Questions were answered to the patient's satisfaction.     Jenkins Rouge

## 2019-04-02 NOTE — Consult Note (Signed)
   [facility] CM Inpatient Consult   04/02/2019  [name] CRUISE 1950/12/05 [id number]    Follow-up note:  Following patient's disposition and needs.  Patient was admitted to [facility] on 6/29 and discharged today 7/2 with dx: severe sepsis, E.coli bacteremia. Hx of  esrd on hd TTS, dementia , multiple admissions for sepsis and bacteremia. He is from home with wife.   PCP: Dr. [name] with [place] [facility], listed to provide transition of care follow-up.   Transition of care RN CM note reviewed and states that patient went home with home health services (RN/ PT). Patient was active with [facility] with resumption of services at discharge. Attempted to call patient twice and spoke with his wife but she refused to talk at that time and hang up the phone.  Plan: Will follow patient with [facility] calls to monitor his recovery post hospitalization.  [facility] care management pharmacy and [facility] notified of disposition.   For questions and additional information, please call:  [name], BSN, RN-BC [facility] Liaison Cell: [phone]

## 2019-04-02 NOTE — Progress Notes (Signed)
[facility] Progress Note   Subjective: On HD, surprisingly no new complaints. Stable from renal standpoint for DC.   Objective Vitals:   04/02/19 0526 04/02/19 0725 04/02/19 0734 04/02/19 0740  BP: 135/66 139/67 131/62 138/79  Pulse: 72 71 65 67  Resp:  18  20  Temp: 98.4 F (36.9 C) (!) 97.4 F (36.3 C)    TempSrc: Oral Oral    SpO2: 96% 96%    Weight:  102.1 kg    Height:       Physical Exam General:Pleasant older male in NAD Heart:S1,S2 RRR Lungs:CTAB A/P Uses CPAP Abdomen:soft, NT Extremities:No LE edema, edema noted in hands Dialysis Access:R AVF blood lines connected.    Additional Objective Labs: Basic Metabolic Panel: Recent Labs  Lab 03/27/19 1002  03/30/19 1614 03/31/19 0422 04/01/19 0358 04/02/19 0318  NA 139   < > 139 139 138 139  K 4.8   < > 4.2 4.5 4.3 4.1  CL 104   < > 99 100 98 98  CO2 24   < > 26 24 26 25   GLUCOSE 102*   < > 85 114* 98 120*  BUN 44*   < > 32* 37* 26* 40*  CREATININE 7.77*   < > 7.68* 9.35* 7.08* 9.23*  CALCIUM 8.5*   < > 8.2* 8.0* 7.9* 7.6*  PHOS 3.2  --  3.7  --   --   --    < > = values in this interval not displayed.   Liver Function Tests: Recent Labs  Lab 03/30/19 0353 03/30/19 1614 03/31/19 0422 04/01/19 0358  AST 42*  --  48* 96*  ALT 102*  --  90* 115*  ALKPHOS 206*  --  253* 289*  BILITOT 1.0  --  1.1 0.9  PROT 5.9*  --  6.6 6.8  ALBUMIN 2.7* 2.9* 2.8* 2.9*   No results for input(s): LIPASE, AMYLASE in the last 168 hours. CBC: Recent Labs  Lab 03/26/19 2230  03/30/19 0353 03/30/19 1614 03/31/19 0422 04/01/19 0358 04/02/19 0318  WBC 9.5   < > 6.9 7.7 7.2 9.3 8.3  NEUTROABS 7.2  --   --   --   --   --  4.9  HGB 12.5*   < > 10.6* 10.7* 10.0* 10.4* 10.0*  HCT 39.1   < > 33.2* 32.9* 31.0* 32.0* 30.4*  MCV 100.8*   < > 100.0 98.8 99.4 98.2 98.1  PLT 197   < > 203 199 199 194 224   < > = values in this interval not displayed.   Blood Culture    Component Value Date/Time   SDES BLOOD LEFT HAND 03/26/2019 2245   SPECREQUEST  03/26/2019 2245    BOTTLES DRAWN AEROBIC AND ANAEROBIC Blood Culture results may not be optimal due to an inadequate volume of blood received in culture bottles   CULT ESCHERICHIA COLI (A) 03/26/2019 2245   REPTSTATUS 03/29/2019 FINAL 03/26/2019 2245    Cardiac Enzymes: No results for input(s): CKTOTAL, CKMB, CKMBINDEX, TROPONINI in the last 168 hours. CBG: Recent Labs  Lab 03/27/19 0501  GLUCAP 94   Iron Studies: No results for input(s): IRON, TIBC, TRANSFERRIN, FERRITIN in the last 72 hours. @lablastinr3 @ Studies/Results: Dg Ercp Biliary & Pancreatic Ducts  Result Date: 03/31/2019 CLINICAL DATA:  ERCP for common bile duct stone. EXAM: ERCP TECHNIQUE: Multiple spot images obtained with the fluoroscopic device and submitted for interpretation post-procedure. COMPARISON:  MRCP-03/29/2019 FLUOROSCOPY TIME:  3 minutes, 46 seconds  FINDINGS: Five spot intraoperative fluoroscopic images of the right upper abdominal quadrant during ERCP are provided for review Initial image demonstrates an ERCP probe overlying the right upper abdominal quadrant. Cholecystectomy clips overlies expected location of gallbladder fossa. There is faint opacification of the moderate to markedly dilated common bile duct with multiple ill-defined filling defects within the CBD worrisome for extensive choledocholithiasis as demonstrated on preceding MRCP. Subsequent images demonstrate insufflation of a balloon within the mid/distal aspect of the CBD. Subsequent images demonstrate insufflation of a balloon within the central aspect of the CBD with subsequent presumed biliary sweeping and sphincterotomy. Completion image demonstrates placement of an internal plastic biliary stent with distal tip overlying the descending portion of the duodenum in superior tip overlying the superior/mid aspect the CBD. IMPRESSION: ERCP with findings suggestive of extensive cholelithiasis with  subsequent biliary sweeping / sphincterotomy and internal biliary stent placement as above. These images were submitted for radiologic interpretation only. Please see the procedural report for the amount of contrast and the fluoroscopy time utilized. Electronically Signed   By: Sandi Mariscal M.D.   On: 03/31/2019 13:32   Medications: . sodium chloride 10 mL/hr at 04/02/19 0300  . sodium chloride    . sodium chloride    . sodium chloride    . sodium chloride    . sodium chloride    . sodium chloride    .  ceFAZolin (ANCEF) IV    . lactated ringers Stopped (03/27/19 1610)   . allopurinol  100 mg Oral Daily  . aspirin EC  325 mg Oral Daily  . Chlorhexidine Gluconate Cloth  6 each Topical Q0600  . cinacalcet  90 mg Oral BID WC  . citalopram  10 mg Oral QHS  . colestipol  3 g Oral Daily  . feeding supplement (NEPRO CARB STEADY)  237 mL Oral BID BM  . gabapentin  600 mg Oral QHS  . heparin  5,000 Units Subcutaneous Q8H  . methimazole  2.5 mg Oral Daily  . midodrine      . midodrine  10 mg Oral TID WC  . pantoprazole  40 mg Oral QHS     Dialysis Orders: AF TTS  4:15 h 101kg 2/2.25 bath AVF 450/800Hep none no ESA or Fe  hectorol 74mg IV TIW Recent labs: hgb 12.9 33% sat Ca/P ok K 5s iPTH 378   Assessment/Plan: 1. Sepsis due to E coli bacteremia.- ^ procalcitonin- on Vanc, Maxipime Echo showed mild calcification of the Ao value - could not r/o small vegetation - suggested TEE to further r/o. r/o Cdiff, COVID neg. 2. Elevated LFTs suggestive of shock liver AST 347 ALT 416 alk phos 285 but total bili 3.9(last outpt alk phos was 133 12/2018 - no outpatient LFTs since last hospitalization . Repeat labs 6/27 show marked improved in LFTs and decline in T bili to 2.3. Episodes of Kleb pneuo in the past year related to biliary issues - per primary 3. ERCP today. Choledocholithiasisi-partial removal with biliary sphincterotomy-stent inserted. Common bile duct dilated, biliary tree  swept. Per primary/GI.  4. ESRD- TTS-HD today. K+ 4.1 2.0 K bath.  5. Hypertension/volume- chronic intra dialytic hypotension during HD treatments even when not ill.Usually asymptomatic. Midodrine prior to HD. BP stable at present. Uses UFP 2 D/T early sign off-attempt at OP center. Pre wt 102.4.UFG 2.0 today.  6. Anemia-HGB 10.0 Gradual decline. No OP ESA. Follow HGB. 7. Metabolic bone disease-ContinueBinders/VDRA 8. Nutrition- diet + supplements/vitamins  Disposition: Hopefully DC home today.  Rita H. Brown NP-C 04/02/2019, 8:01 AM  [employer] [phone]

## 2019-04-02 NOTE — Progress Notes (Signed)
Wife [name] called and all discharge instructions were reviewed with her. Copy of instructions given to patient and prescriptions sent to pharmacy.

## 2019-04-02 NOTE — Discharge Summary (Signed)
Physician Discharge Summary  [name] LYY:[id number] DOB: Jul 11, 1951 DOA: 03/26/2019  PCP: Biagio Borg, MD  Admit date: 03/26/2019 Discharge date: 04/02/2019  Admitted From: Home Disposition: Home  Recommendations for Outpatient Follow-up:  1. Follow up with PCP in 1 week with repeat CBC/BMP 2. Outpatient follow-up with dialysis as scheduled 3. Outpatient follow-up with GI/Dr. Watt Climes in 1 to 2 weeks 4. Follow up in ED if symptoms worsen or new appear   Home Health: Home with PT/RN Equipment/Devices: None  Discharge Condition: Stable CODE STATUS: Full Diet recommendation: Heart healthy/renal hemodialysis diet  Brief/Interim Summary: 68 year old male with history of end-stage renal disease on hemodialysis, dementia, multiple admissions for sepsis and bacteremia presented on 03/26/2019 with hypotension, blood pressure in the 70s along with low-grade fever.  He was started on broad-spectrum antibiotics.  He was found to have E. coli bacteremia.  MRCP showed diffuse biliary ductal dilatation with biliary stones.  He underwent ERCP with subsequent biliary sphincterotomy, removal of stones and plastic stent placement in common bile duct.  Nephrology has been following the patient.  There was a question of aortic valve vegetation on his echo on presentation.  He had a TEE done today which ruled out any vegetation.  He is afebrile and hemodynamically stable.  He will be discharged on oral antibiotics with outpatient follow-up.  Discharge Diagnoses:   Severe sepsis: Present on admission, secondary to bacteremia E. coli bacteremia Choledocholithiasis with elevated LFTs -Blood cultures growing E. coli.    Rocephin was switched to IV Ancef. -Sepsis resolved.  Currently medically stable. -MRCP showed increased severe diffuse biliary ductal dilatation, with increase in numerous calculi throughout the common hepatic and bile ducts.  Status post ERCP on 03/31/2019 subsequent biliary sphincterotomy,  removal of stones and plastic stent placement in common bile duct.  Patient will need repeat ERCP as an outpatient for removal of stent. -Tolerating diet.   -Outpatient follow-up with GI/Dr. Watt Climes - Will discharge home today on Keflex for 6 more days.  Suspected aortic valve vegetation -Echo done during the hospitalization showed EF of 55 to 12%, LV diastolic Doppler parameters consistent with impaired relaxation.  Calcification of the aortic valve.   -Status post TEE today which was negative for vegetation.  End-stage renal disease on hemodialysis -Nephrology following.  Dialysis as per nephrology schedule -Outpatient follow-up with outpatient dialysis  Anemia of chronic disease--hemoglobin stable.  Monitor outpatient.  Hypothyroidism--continue Tapazole  Chronic hypotension-continue midodrine  Diarrhea -C. difficile negative.  Continue Imodium as needed.  Continue colestipol  Depression-continue Celexa  Neuropathy/chronic pain-- Outpatient follow-up  Discharge Instructions  Discharge Instructions    Diet - low sodium heart healthy   Complete by: As directed    Increase activity slowly   Complete by: As directed      Allergies as of 04/02/2019      Reactions   Statins Other (See Comments)   Weak muscles   Ciprofloxacin Rash      Medication List    STOP taking these medications   feeding supplement (NEPRO CARB STEADY) Liqd   HYDROcodone-acetaminophen 5-325 MG tablet Commonly known as: NORCO/VICODIN     TAKE these medications   acetaminophen 325 MG tablet Commonly known as: TYLENOL Take 650 mg by mouth every 6 (six) hours as needed for mild pain or headache.   Alirocumab 150 MG/ML Sopn Commonly known as: Praluent Inject 150 mg into the skin every 14 (fourteen) days.   allopurinol 100 MG tablet Commonly known as: ZYLOPRIM Take 1 tablet  by mouth once daily Notes to patient: Next dose due 04/03/19 8am   aspirin EC 325 MG tablet Take 1 tablet (325 mg  total) by mouth daily. Notes to patient: Next dose due 04/03/19 8am   betamethasone dipropionate 0.05 % cream Commonly known as: DIPROLENE Apply 1 application topically daily as needed (leg sores).   cephALEXin 500 MG capsule Commonly known as: KEFLEX Take 1 capsule (500 mg total) by mouth 2 (two) times daily for 6 days.   citalopram 10 MG tablet Commonly known as: CELEXA Take 1 tablet (10 mg total) by mouth at bedtime.   colestipol 1 g tablet Commonly known as: COLESTID Take 3 tablets (3 g total) by mouth daily.   doxercalciferol 4 MCG/2ML injection Commonly known as: [name] Inject 1 mL (2 mcg total) into the vein Every Tuesday,Thursday,and Saturday with dialysis.   ezetimibe 10 MG tablet Commonly known as: ZETIA TAKE ONE TABLET BY MOUTH ONCE DAILY   ferric citrate 1 GM 210 MG(Fe) tablet Commonly known as: AURYXIA Take 2 tablets (420 mg total) by mouth 3 (three) times daily with meals.   gabapentin 300 MG capsule Commonly known as: NEURONTIN TAKE 1 TO 2 CAPSULES BY MOUTH AT BEDTIME AS NEEDED FOR PAIN AND FOR SLEEP What changed: See the new instructions.   hydroxypropyl methylcellulose / hypromellose 2.5 % ophthalmic solution Commonly known as: ISOPTO TEARS / GONIOVISC Place 1 drop into both eyes 3 (three) times daily as needed for dry eyes.   loperamide 2 MG capsule Commonly known as: IMODIUM Take 1 capsule (2 mg total) by mouth 2 (two) times daily as needed for diarrhea or loose stools.   methimazole 5 MG tablet Commonly known as: TAPAZOLE Take 0.5 tablets (2.5 mg total) by mouth daily.   midodrine 10 MG tablet Commonly known as: PROAMATINE Take 1 tablet (10 mg total) by mouth 3 (three) times daily with meals.   multivitamin Tabs tablet Take 1 tablet by mouth daily.   pantoprazole 40 MG tablet Commonly known as: PROTONIX Take 40 mg by mouth at bedtime.   polyethylene glycol 17 g packet Commonly known as: MIRALAX / GLYCOLAX Take 17 g by mouth daily as  needed for mild constipation.   Sensipar 90 MG tablet Generic drug: cinacalcet Take 90 mg by mouth 2 (two) times daily.   traMADol 50 MG tablet Commonly known as: ULTRAM Take 1 tablet (50 mg total) by mouth every 6 (six) hours as needed.   zolpidem 5 MG tablet Commonly known as: AMBIEN Take 1 tablet (5 mg total) by mouth at bedtime as needed. for sleep       Follow-up Information    Biagio Borg, MD. Schedule an appointment as soon as possible for a visit in 1 week(s).   Specialties: Internal Medicine, Radiology Contact information: [address] [place] [address] [phone]          Allergies  Allergen Reactions  . Statins Other (See Comments)    Weak muscles  . Ciprofloxacin Rash    Consultations:  GI/nephrology   Procedures/Studies: Mr Abdomen Mrcp Wo Contrast  Result Date: 03/29/2019 CLINICAL DATA:  Choledocholithiasis. Bacteremia. Status post sphincterotomy approximately 4 months ago. Renal insufficiency. EXAM: MRI ABDOMEN WITHOUT CONTRAST  (INCLUDING MRCP) TECHNIQUE: Multiplanar multisequence MR imaging of the abdomen was performed. Heavily T2-weighted images of the biliary and pancreatic ducts were obtained, and three-dimensional MRCP images were rendered by post processing. COMPARISON:  11/13/2018 FINDINGS: Lower chest: No acute findings. Hepatobiliary: No masses  visualized on this unenhanced exam. Diffuse T2 hypointensity consistent with iron deposition. Prior cholecystectomy. Diffuse biliary ductal dilatation is seen with from bile duct measuring up to 20 mm in diameter. Numerous calculi are seen throughout the common bile duct and common hepatic duct, largest measuring 1.8 cm. Degree of ductal dilatation in number of biliary calculi increased since previous study. Pancreas: No mass or inflammatory process visualized on this unenhanced exam. No evidence of pancreatic ductal dilatation. Spleen: Within normal limits in size. Diffuse T2 hypointensity,  consistent with iron deposition. 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 No evidence of solid renal mass or hydronephrosis. Stomach/Bowel: Visualized portion unremarkable. Vascular/Lymphatic: No pathologically enlarged lymph nodes identified. No evidence of abdominal aortic aneurysm. Other:  None. Musculoskeletal: No suspicious bone lesions identified. Diffuse bone marrow T2 hypointensity, consistent with iron overload. IMPRESSION: Increased severe diffuse biliary ductal dilatation, with increase in numerous calculi throughout the common hepatic and bile ducts measuring up to 1.8 cm. Findings of transfusional hemosiderosis. Electronically Signed   By: Marlaine Hind M.D.   On: 03/29/2019 13:26   Dg Chest Port 1 View  Result Date: 03/26/2019 CLINICAL DATA:  Weakness and fever EXAM: PORTABLE CHEST 1 VIEW COMPARISON:  None. FINDINGS: The heart size and mediastinal contours are within normal limits. Both lungs are clear. The visualized skeletal structures are unremarkable. IMPRESSION: No active disease. Electronically Signed   By: Ulyses Jarred M.D.   On: 03/26/2019 23:20   Dg Ercp Biliary & Pancreatic Ducts  Result Date: 03/31/2019 CLINICAL DATA:  ERCP for common bile duct stone. EXAM: ERCP TECHNIQUE: Multiple spot images obtained with the fluoroscopic device and submitted for interpretation post-procedure. COMPARISON:  MRCP-03/29/2019 FLUOROSCOPY TIME:  3 minutes, 46 seconds FINDINGS: Five spot intraoperative fluoroscopic images of the right upper abdominal quadrant during ERCP are provided for review Initial image demonstrates an ERCP probe overlying the right upper abdominal quadrant. Cholecystectomy clips overlies expected location of gallbladder fossa. There is faint opacification of the moderate to markedly dilated common bile duct with multiple ill-defined filling defects within  the CBD worrisome for extensive choledocholithiasis as demonstrated on preceding MRCP. Subsequent images demonstrate insufflation of a balloon within the mid/distal aspect of the CBD. Subsequent images demonstrate insufflation of a balloon within the central aspect of the CBD with subsequent presumed biliary sweeping and sphincterotomy. Completion image demonstrates placement of an internal plastic biliary stent with distal tip overlying the descending portion of the duodenum in superior tip overlying the superior/mid aspect the CBD. IMPRESSION: ERCP with findings suggestive of extensive cholelithiasis with subsequent biliary sweeping / sphincterotomy and internal biliary stent placement as above. These images were submitted for radiologic interpretation only. Please see the procedural report for the amount of contrast and the fluoroscopy time utilized. Electronically Signed   By: Sandi Mariscal M.D.   On: 03/31/2019 13:32     ERCP on 03/31/2019 Impression: - A medium amount of food (residue) in the stomach. - The major papilla appeared to be bulging. - Choledocholithiasis was found. Partial removal  was accomplished with biliary sphincterotomy; a  stent was inserted. - A biliary sphincterotomy was performed. - The biliary tree was swept. The basket was also  used both to remove stones and crushed stones - Common bile duct was successfully dilated. - One plastic stent was placed into the common bile  duct. Moderate Sedation: moderate sedation-none Recommendation: - Clear liquid diet for 6  hours. Slowly advance as  tolerated - Continue present  medications. - Return to GI clinic PRN. - Telephone GI clinic if symptomatic PRN. - Repeat ERCP in 1 month to remove stent and  proceed with spyglass and lithotripsy if needed and  complete removal hopefully at that time.  Echo IMPRESSIONS   1. The left ventricle has normal systolic function, with an ejection fraction of 55-60%. The cavity size was normal. Left ventricular diastolic Doppler parameters are consistent with impaired relaxation. No evidence of left ventricular regional wall  motion abnormalities. 2. The right ventricle has normal systolic function. The cavity was normal. There is no increase in right ventricular wall thickness. 3. There is mild mitral annular calcification present. No evidence of mitral valve stenosis. No significant regurgitation. 4. The aortic valve is tricuspid. Mild calcification of the aortic valve. No stenosis of the aortic valve. Cannot rule out a small vegetation on the aortic valve but not definite. Would suggest a TEE to further assess. 5. The aortic root is normal in size and structure. 6. The inferior vena cava was dilated in size with >50% respiratory variability. PA systolic pressure 34 mmHg.  TEE on 04/02/2019: Negative for vegetation  Subjective: Patient seen and examined at bedside.  He denies overnight fever, nausea or vomiting.  Poor historian.  Feels that he is okay to go home today.  Spoke to wife [name] on phone today.  Discharge Exam: Vitals:   04/02/19 1220 04/02/19 1225  BP: (!) 111/45 (!) 112/51  Pulse:    Resp: 15 14  Temp:    SpO2: 100% 97%    General: Pt is alert, awake, not in acute distress.  Poor historian Cardiovascular: rate controlled, S1/S2 + Respiratory: bilateral decreased breath sounds at bases, scattered crackles Abdominal: Soft, NT, ND, bowel sounds + Extremities: Trace  edema, no cyanosis    The results of significant diagnostics from this hospitalization (including imaging, microbiology, ancillary and laboratory) are listed below for reference.     Microbiology: Recent Results (from the past 240 hour(s))  Culture, blood (routine x 2)     Status: None (Preliminary result)   Collection Time: 03/26/19 10:35 PM   Specimen: BLOOD  Result Value Ref Range Status   Specimen Description BLOOD LEFT ANTECUBITAL  Final   Special Requests   Final    BOTTLES DRAWN AEROBIC AND ANAEROBIC Blood Culture results may not be optimal due to an inadequate volume of blood received in culture bottles   Culture  Setup Time   Final    GRAM NEGATIVE RODS ANAEROBIC BOTTLE ONLY CRITICAL VALUE NOTED.  VALUE IS CONSISTENT WITH PREVIOUSLY REPORTED AND CALLED VALUE.    Culture   Final    GRAM NEGATIVE RODS IDENTIFICATION TO FOLLOW Performed at [facility], [place] [address], [place], [place] [address]    Report Status PENDING  Incomplete  Culture, blood (routine x 2)     Status: Abnormal   Collection Time: 03/26/19 10:45 PM   Specimen: BLOOD LEFT HAND  Result Value Ref Range Status   Specimen Description BLOOD LEFT HAND  Final   Special Requests   Final    BOTTLES DRAWN AEROBIC AND ANAEROBIC Blood Culture results may not be optimal due to an inadequate volume of blood received in culture bottles   Culture  Setup Time   Final    GRAM NEGATIVE RODS IN BOTH AEROBIC AND ANAEROBIC BOTTLES Organism ID to follow CRITICAL RESULT CALLED TO, READ BACK BY AND VERIFIED WITH: M.  Prentiss Bells PharmD 16:05 03/27/19 ([email or web address]) Performed at [facility], [place] [address], [place], [place] [address]    Culture ESCHERICHIA COLI (A)  Final   Report Status 03/29/2019 FINAL  Final   Organism ID, Bacteria ESCHERICHIA COLI  Final      Susceptibility   Escherichia coli - MIC*    AMPICILLIN <=2 SENSITIVE Sensitive     CEFAZOLIN <=4 SENSITIVE Sensitive     CEFEPIME <=1 SENSITIVE Sensitive      CEFTAZIDIME <=1 SENSITIVE Sensitive     CEFTRIAXONE <=1 SENSITIVE Sensitive     CIPROFLOXACIN <=0.25 SENSITIVE Sensitive     GENTAMICIN <=1 SENSITIVE Sensitive     IMIPENEM <=0.25 SENSITIVE Sensitive     TRIMETH/SULFA <=20 SENSITIVE Sensitive     AMPICILLIN/SULBACTAM <=2 SENSITIVE Sensitive     PIP/TAZO <=4 SENSITIVE Sensitive     Extended ESBL NEGATIVE Sensitive     * ESCHERICHIA COLI  Blood Culture ID Panel (Reflexed)     Status: Abnormal   Collection Time: 03/26/19 10:45 PM  Result Value Ref Range Status   Enterococcus species NOT DETECTED NOT DETECTED Final   Listeria monocytogenes NOT DETECTED NOT DETECTED Final   Staphylococcus species NOT DETECTED NOT DETECTED Final   Staphylococcus aureus (BCID) NOT DETECTED NOT DETECTED Final   Streptococcus species NOT DETECTED NOT DETECTED Final   Streptococcus agalactiae NOT DETECTED NOT DETECTED Final   Streptococcus pneumoniae NOT DETECTED NOT DETECTED Final   Streptococcus pyogenes NOT DETECTED NOT DETECTED Final   Acinetobacter baumannii NOT DETECTED NOT DETECTED Final   Enterobacteriaceae species DETECTED (A) NOT DETECTED Final    Comment: Enterobacteriaceae represent a large family of gram-negative bacteria, not a single organism. CRITICAL RESULT CALLED TO, READ BACK BY AND VERIFIED WITH: Hughie Closs PharmD 16:00 03/27/19 ([email or web address])    Enterobacter cloacae complex NOT DETECTED NOT DETECTED Final   Escherichia coli DETECTED (A) NOT DETECTED Final    Comment: CRITICAL RESULT CALLED TO, READ BACK BY AND VERIFIED WITH: Hughie Closs PharmD 16:00 03/27/19 ([email or web address])    Klebsiella oxytoca NOT DETECTED NOT DETECTED Final   Klebsiella pneumoniae NOT DETECTED NOT DETECTED Final   Proteus species NOT DETECTED NOT DETECTED Final   Serratia marcescens NOT DETECTED NOT DETECTED Final   Carbapenem resistance NOT DETECTED NOT DETECTED Final   Haemophilus influenzae NOT DETECTED NOT DETECTED Final   Neisseria meningitidis NOT DETECTED NOT DETECTED Final    Pseudomonas aeruginosa NOT DETECTED NOT DETECTED Final   Candida albicans NOT DETECTED NOT DETECTED Final   Candida glabrata NOT DETECTED NOT DETECTED Final   Candida krusei NOT DETECTED NOT DETECTED Final   Candida parapsilosis NOT DETECTED NOT DETECTED Final   Candida tropicalis NOT DETECTED NOT DETECTED Final    Comment: Performed at [facility], [place] [address], [place], [place] [address]  SARS Coronavirus 2 (CEPHEID - Performed in [facility] hospital lab), Hosp Order     Status: None   Collection Time: 03/26/19 11:25 PM   Specimen: Nasopharyngeal Swab  Result Value Ref Range Status   SARS Coronavirus 2 NEGATIVE NEGATIVE Final    Comment: (NOTE) If result is NEGATIVE SARS-CoV-2 target nucleic acids are NOT DETECTED. The SARS-CoV-2 RNA is generally detectable in upper and lower  respiratory specimens during the acute phase of infection. The lowest  concentration of SARS-CoV-2 viral copies this assay can detect is 250  copies / mL. A negative result does not preclude SARS-CoV-2 infection  and should not be used as the sole basis  for treatment or other  patient management decisions.  A negative result may occur with  improper specimen collection / handling, submission of specimen other  than nasopharyngeal swab, presence of viral mutation(s) within the  areas targeted by this assay, and inadequate number of viral copies  (<250 copies / mL). A negative result must be combined with clinical  observations, patient history, and epidemiological information. If result is POSITIVE SARS-CoV-2 target nucleic acids are DETECTED. The SARS-CoV-2 RNA is generally detectable in upper and lower  respiratory specimens dur ing the acute phase of infection.  Positive  results are indicative of active infection with SARS-CoV-2.  Clinical  correlation with patient history and other diagnostic information is  necessary to determine patient infection status.  Positive results do  not rule out  bacterial infection or co-infection with other viruses. If result is PRESUMPTIVE POSTIVE SARS-CoV-2 nucleic acids MAY BE PRESENT.   A presumptive positive result was obtained on the submitted specimen  and confirmed on repeat testing.  While 2019 novel coronavirus  (SARS-CoV-2) nucleic acids may be present in the submitted sample  additional confirmatory testing may be necessary for epidemiological  and / or clinical management purposes  to differentiate between  SARS-CoV-2 and other Sarbecovirus currently known to infect humans.  If clinically indicated additional testing with an alternate test  methodology [phone]) is advised. The SARS-CoV-2 RNA is generally  detectable in upper and lower respiratory sp ecimens during the acute  phase of infection. The expected result is Negative. Fact Sheet for Patients:  StrictlyIdeas.no Fact Sheet for Healthcare Providers: BankingDealers.co.za This test is not yet approved or cleared by the [place] FDA and has been authorized for detection and/or diagnosis of SARS-CoV-2 by FDA under an Emergency Use Authorization (EUA).  This EUA will remain in effect (meaning this test can be used) for the duration of the COVID-19 declaration under Section 564(b)(1) of the Act, 21 U.S.C. section 360bbb-3(b)(1), unless the authorization is terminated or revoked sooner. Performed at [facility], [place] [address] [place], [place] [address]   MRSA PCR Screening     Status: None   Collection Time: 03/27/19  5:03 AM   Specimen: Nasal Mucosa; Nasopharyngeal  Result Value Ref Range Status   MRSA by PCR NEGATIVE NEGATIVE Final    Comment:        The GeneXpert MRSA Assay (FDA approved for NASAL specimens only), is one component of a comprehensive MRSA colonization surveillance program. It is not intended to diagnose MRSA infection nor to guide or monitor treatment for MRSA infections. Performed at [facility], [place] [address], [place], [place] [address]   C difficile quick scan w PCR reflex     Status: None   Collection Time: 03/27/19  9:32 AM   Specimen: STOOL  Result Value Ref Range Status   C Diff antigen NEGATIVE NEGATIVE Final   C Diff toxin NEGATIVE NEGATIVE Final   C Diff interpretation No C. difficile detected.  Final    Comment: Performed at [facility], [place] [address] [place], [place] [address]  Novel Coronavirus, NAA (hospital order; send-out to ref lab)     Status: None   Collection Time: 03/30/19  6:34 PM   Specimen: Nasopharyngeal Swab; Respiratory  Result Value Ref Range Status   SARS-CoV-2, NAA NOT DETECTED NOT DETECTED Final    Comment: (NOTE) This test was developed and its performance characteristics determined by [employer]. This test has not been FDA cleared  or approved. This test has been authorized by FDA under an Emergency Use Authorization (EUA). This test is only authorized for the duration of time the declaration that circumstances exist justifying the authorization of the emergency use of in vitro diagnostic tests for detection of SARS-CoV-2 virus and/or diagnosis of COVID-19 infection under section 564(b)(1) of the Act, 21 U.S.C. 026VZC-5(Y)(8), unless the authorization is terminated or revoked sooner. When diagnostic testing is negative, the possibility of a false negative result should be considered in the context of a patient's recent exposures and the presence of clinical signs and symptoms consistent with COVID-19. An individual without symptoms of COVID-19 and who is not shedding SARS-CoV-2 virus would expect to have a negative (not detected) result in this assay. Performed  At: [facility] [address] [place], [place] [id number] Rush Farmer MD NO:[id number]    [place]  Final    Comment: Performed at [facility], [place] [address] [place], [place] [address]     Labs: BNP  (last 3 results) No results for input(s): BNP in the last 8760 hours. Basic Metabolic Panel: Recent Labs  Lab 03/27/19 1002  03/30/19 0353 03/30/19 1614 03/31/19 0422 04/01/19 0358 04/02/19 0318  NA 139   < > 140 139 139 138 139  K 4.8   < > 5.4* 4.2 4.5 4.3 4.1  CL 104   < > 103 99 100 98 98  CO2 24   < > 20* 26 24 26 25   GLUCOSE 102*   < > 72 85 114* 98 120*  BUN 44*   < > 66* 32* 37* 26* 40*  CREATININE 7.77*   < > 12.38* 7.68* 9.35* 7.08* 9.23*  CALCIUM 8.5*   < > 8.0* 8.2* 8.0* 7.9* 7.6*  MG 1.9  --   --   --   --   --  2.1  PHOS 3.2  --   --  3.7  --   --   --    < > = values in this interval not displayed.   Liver Function Tests: Recent Labs  Lab 03/26/19 2230 03/28/19 0323 03/30/19 0353 03/30/19 1614 03/31/19 0422 04/01/19 0358  AST 347* 86* 42*  --  48* 96*  ALT 416* 197* 102*  --  90* 115*  ALKPHOS 286* 200* 206*  --  253* 289*  BILITOT 3.9* 2.3* 1.0  --  1.1 0.9  PROT 7.0 6.4* 5.9*  --  6.6 6.8  ALBUMIN 3.2* 2.8* 2.7* 2.9* 2.8* 2.9*   No results for input(s): LIPASE, AMYLASE in the last 168 hours. No results for input(s): AMMONIA in the last 168 hours. CBC: Recent Labs  Lab 03/26/19 2230  03/30/19 0353 03/30/19 1614 03/31/19 0422 04/01/19 0358 04/02/19 0318  WBC 9.5   < > 6.9 7.7 7.2 9.3 8.3  NEUTROABS 7.2  --   --   --   --   --  4.9  HGB 12.5*   < > 10.6* 10.7* 10.0* 10.4* 10.0*  HCT 39.1   < > 33.2* 32.9* 31.0* 32.0* 30.4*  MCV 100.8*   < > 100.0 98.8 99.4 98.2 98.1  PLT 197   < > 203 199 199 194 224   < > = values in this interval not displayed.   Cardiac Enzymes: No results for input(s): CKTOTAL, CKMB, CKMBINDEX, TROPONINI in the last 168 hours. BNP: Invalid input(s): POCBNP CBG: Recent Labs  Lab 03/27/19 0501  GLUCAP 94   D-Dimer No results  for input(s): DDIMER in the last 72 hours. Hgb A1c No results for input(s): HGBA1C in the last 72 hours. Lipid Profile No results for input(s): CHOL, HDL, LDLCALC, TRIG, CHOLHDL, LDLDIRECT  in the last 72 hours. Thyroid function studies No results for input(s): TSH, T4TOTAL, T3FREE, THYROIDAB in the last 72 hours.  Invalid input(s): FREET3 Anemia work up No results for input(s): VITAMINB12, FOLATE, FERRITIN, TIBC, IRON, RETICCTPCT in the last 72 hours. Urinalysis    Component Value Date/Time   COLORURINE LT. YELLOW 06/26/2013 1200   APPEARANCEUR CLEAR 06/26/2013 1200   LABSPEC 1.020 06/26/2013 1200   PHURINE 8.5 06/26/2013 1200   GLUCOSEU 100 06/26/2013 1200   HGBUR MODERATE 06/26/2013 1200   BILIRUBINUR NEGATIVE 06/26/2013 1200   KETONESUR NEGATIVE 06/26/2013 1200   PROTEINUR >300 (A) 01/04/2010 1032   UROBILINOGEN 0.2 06/26/2013 1200   NITRITE NEGATIVE 06/26/2013 1200   LEUKOCYTESUR TRACE 06/26/2013 1200   Sepsis Labs Invalid input(s): PROCALCITONIN,  WBC,  LACTICIDVEN Microbiology Recent Results (from the past 240 hour(s))  Culture, blood (routine x 2)     Status: None (Preliminary result)   Collection Time: 03/26/19 10:35 PM   Specimen: BLOOD  Result Value Ref Range Status   Specimen Description BLOOD LEFT ANTECUBITAL  Final   Special Requests   Final    BOTTLES DRAWN AEROBIC AND ANAEROBIC Blood Culture results may not be optimal due to an inadequate volume of blood received in culture bottles   Culture  Setup Time   Final    GRAM NEGATIVE RODS ANAEROBIC BOTTLE ONLY CRITICAL VALUE NOTED.  VALUE IS CONSISTENT WITH PREVIOUSLY REPORTED AND CALLED VALUE.    Culture   Final    GRAM NEGATIVE RODS IDENTIFICATION TO FOLLOW Performed at [facility], [place] [address], [place], [place]  [address]    Report Status PENDING  Incomplete  Culture, blood (routine x 2)     Status: Abnormal   Collection Time: 03/26/19 10:45 PM   Specimen: BLOOD LEFT HAND  Result Value Ref Range Status   Specimen Description BLOOD LEFT HAND  Final   Special Requests   Final    BOTTLES DRAWN AEROBIC AND ANAEROBIC Blood Culture results may not be optimal due to an inadequate volume  of blood received in culture bottles   Culture  Setup Time   Final    GRAM NEGATIVE RODS IN BOTH AEROBIC AND ANAEROBIC BOTTLES Organism ID to follow CRITICAL RESULT CALLED TO, READ BACK BY AND VERIFIED WITH: Hughie Closs PharmD 16:05 03/27/19 ([email or web address]) Performed at [facility], [place] [address], [place], [place] [address]    Culture ESCHERICHIA COLI (A)  Final   Report Status 03/29/2019 FINAL  Final   Organism ID, Bacteria ESCHERICHIA COLI  Final      Susceptibility   Escherichia coli - MIC*    AMPICILLIN <=2 SENSITIVE Sensitive     CEFAZOLIN <=4 SENSITIVE Sensitive     CEFEPIME <=1 SENSITIVE Sensitive     CEFTAZIDIME <=1 SENSITIVE Sensitive     CEFTRIAXONE <=1 SENSITIVE Sensitive     CIPROFLOXACIN <=0.25 SENSITIVE Sensitive     GENTAMICIN <=1 SENSITIVE Sensitive     IMIPENEM <=0.25 SENSITIVE Sensitive     TRIMETH/SULFA <=20 SENSITIVE Sensitive     AMPICILLIN/SULBACTAM <=2 SENSITIVE Sensitive     PIP/TAZO <=4 SENSITIVE Sensitive     Extended ESBL NEGATIVE Sensitive     * ESCHERICHIA COLI  Blood Culture ID Panel (Reflexed)     Status: Abnormal   Collection  Time: 03/26/19 10:45 PM  Result Value Ref Range Status   Enterococcus species NOT DETECTED NOT DETECTED Final   Listeria monocytogenes NOT DETECTED NOT DETECTED Final   Staphylococcus species NOT DETECTED NOT DETECTED Final   Staphylococcus aureus (BCID) NOT DETECTED NOT DETECTED Final   Streptococcus species NOT DETECTED NOT DETECTED Final   Streptococcus agalactiae NOT DETECTED NOT DETECTED Final   Streptococcus pneumoniae NOT DETECTED NOT DETECTED Final   Streptococcus pyogenes NOT DETECTED NOT DETECTED Final   Acinetobacter baumannii NOT DETECTED NOT DETECTED Final   Enterobacteriaceae species DETECTED (A) NOT DETECTED Final    Comment: Enterobacteriaceae represent a large family of gram-negative bacteria, not a single organism. CRITICAL RESULT CALLED TO, READ BACK BY AND VERIFIED WITH: Hughie Closs PharmD 16:00 03/27/19  ([email or web address])    Enterobacter cloacae complex NOT DETECTED NOT DETECTED Final   Escherichia coli DETECTED (A) NOT DETECTED Final    Comment: CRITICAL RESULT CALLED TO, READ BACK BY AND VERIFIED WITH: Hughie Closs PharmD 16:00 03/27/19 ([email or web address])    Klebsiella oxytoca NOT DETECTED NOT DETECTED Final   Klebsiella pneumoniae NOT DETECTED NOT DETECTED Final   Proteus species NOT DETECTED NOT DETECTED Final   Serratia marcescens NOT DETECTED NOT DETECTED Final   Carbapenem resistance NOT DETECTED NOT DETECTED Final   Haemophilus influenzae NOT DETECTED NOT DETECTED Final   Neisseria meningitidis NOT DETECTED NOT DETECTED Final   Pseudomonas aeruginosa NOT DETECTED NOT DETECTED Final   Candida albicans NOT DETECTED NOT DETECTED Final   Candida glabrata NOT DETECTED NOT DETECTED Final   Candida krusei NOT DETECTED NOT DETECTED Final   Candida parapsilosis NOT DETECTED NOT DETECTED Final   Candida tropicalis NOT DETECTED NOT DETECTED Final    Comment: Performed at [facility], [place] [address] [place], [place] [address]  SARS Coronavirus 2 (CEPHEID - Performed in [place] hospital lab), Hosp Order     Status: None   Collection Time: 03/26/19 11:25 PM   Specimen: Nasopharyngeal Swab  Result Value Ref Range Status   SARS Coronavirus 2 NEGATIVE NEGATIVE Final    Comment: (NOTE) If result is NEGATIVE SARS-CoV-2 target nucleic acids are NOT DETECTED. The SARS-CoV-2 RNA is generally detectable in upper and lower  respiratory specimens during the acute phase of infection. The lowest  concentration of SARS-CoV-2 viral copies this assay can detect is 250  copies / mL. A negative result does not preclude SARS-CoV-2 infection  and should not be used as the sole basis for treatment or other  patient management decisions.  A negative result may occur with  improper specimen collection / handling, submission of specimen other  than nasopharyngeal swab, presence of viral mutation(s) within the  areas  targeted by this assay, and inadequate number of viral copies  (<250 copies / mL). A negative result must be combined with clinical  observations, patient history, and epidemiological information. If result is POSITIVE SARS-CoV-2 target nucleic acids are DETECTED. The SARS-CoV-2 RNA is generally detectable in upper and lower  respiratory specimens dur ing the acute phase of infection.  Positive  results are indicative of active infection with SARS-CoV-2.  Clinical  correlation with patient history and other diagnostic information is  necessary to determine patient infection status.  Positive results do  not rule out bacterial infection or co-infection with other viruses. If result is PRESUMPTIVE POSTIVE SARS-CoV-2 nucleic acids MAY BE PRESENT.   A presumptive positive result was obtained on the submitted specimen  and confirmed on repeat testing.  While  2019 novel coronavirus  (SARS-CoV-2) nucleic acids may be present in the submitted sample  additional confirmatory testing may be necessary for epidemiological  and / or clinical management purposes  to differentiate between  SARS-CoV-2 and other Sarbecovirus currently known to infect humans.  If clinically indicated additional testing with an alternate test  methodology [phone]) is advised. The SARS-CoV-2 RNA is generally  detectable in upper and lower respiratory sp ecimens during the acute  phase of infection. The expected result is Negative. Fact Sheet for Patients:  StrictlyIdeas.no Fact Sheet for Healthcare Providers: BankingDealers.co.za This test is not yet approved or cleared by the [place] FDA and has been authorized for detection and/or diagnosis of SARS-CoV-2 by FDA under an Emergency Use Authorization (EUA).  This EUA will remain in effect (meaning this test can be used) for the duration of the COVID-19 declaration under Section 564(b)(1) of the Act, 21 U.S.C. section  360bbb-3(b)(1), unless the authorization is terminated or revoked sooner. Performed at [facility], [place] [address] [place], [place] [address]   MRSA PCR Screening     Status: None   Collection Time: 03/27/19  5:03 AM   Specimen: Nasal Mucosa; Nasopharyngeal  Result Value Ref Range Status   MRSA by PCR NEGATIVE NEGATIVE Final    Comment:        The GeneXpert MRSA Assay (FDA approved for NASAL specimens only), is one component of a comprehensive MRSA colonization surveillance program. It is not intended to diagnose MRSA infection nor to guide or monitor treatment for MRSA infections. Performed at [name], [place] [address], [place], [place] [address]   C difficile quick scan w PCR reflex     Status: None   Collection Time: 03/27/19  9:32 AM   Specimen: STOOL  Result Value Ref Range Status   C Diff antigen NEGATIVE NEGATIVE Final   C Diff toxin NEGATIVE NEGATIVE Final   C Diff interpretation No C. difficile detected.  Final    Comment: Performed at [facility], [place] [address] [place], [place] [address]  Novel Coronavirus, NAA (hospital order; send-out to ref lab)     Status: None   Collection Time: 03/30/19  6:34 PM   Specimen: Nasopharyngeal Swab; Respiratory  Result Value Ref Range Status   SARS-CoV-2, NAA NOT DETECTED NOT DETECTED Final    Comment: (NOTE) This test was developed and its performance characteristics determined by [employer]. This test has not been FDA cleared or approved. This test has been authorized by FDA under an Emergency Use Authorization (EUA). This test is only authorized for the duration of time the declaration that circumstances exist justifying the authorization of the emergency use of in vitro diagnostic tests for detection of SARS-CoV-2 virus and/or diagnosis of COVID-19 infection under section 564(b)(1) of the Act, 21 U.S.C. 672CNO-7(S)(9), unless the authorization is terminated or revoked sooner. When  diagnostic testing is negative, the possibility of a false negative result should be considered in the context of a patient's recent exposures and the presence of clinical signs and symptoms consistent with COVID-19. An individual without symptoms of COVID-19 and who is not shedding SARS-CoV-2 virus would expect to have a negative (not detected) result in this assay. Performed  At: [facility] [address] [place], [place] [id number] Rush Farmer MD TM:[id number]    [place]  Final    Comment: Performed at [facility], [place] [address] [place],  [address]  Time coordinating discharge: 35 minutes  SIGNED:   Aline August, MD  [facility] 04/02/2019, 1:38 PM

## 2019-04-02 NOTE — Patient Outreach (Signed)
[facility]) Care Management  04/02/2019  [name] 1951/04/17 [id number]  Receive InBasket message from [place] letting me know that patient has received a 90 days supply of Zetia, she discussed with patient's wife how to obtain refills and [name] denies any further medication questions or concerns at this time.  Will close pharmacy episode.  Jill P. Simcox, [place] Management [phone]

## 2019-04-02 NOTE — Plan of Care (Signed)
  Problem: Education: Goal: Knowledge of General Education information will improve Description: Including pain rating scale, medication(s)/side effects and non-pharmacologic comfort measures Outcome: Adequate for Discharge   Problem: Health Behavior/Discharge Planning: Goal: Ability to manage health-related needs will improve Outcome: Adequate for Discharge   Problem: Clinical Measurements: Goal: Ability to maintain clinical measurements within normal limits will improve Outcome: Adequate for Discharge Goal: Will remain free from infection Outcome: Adequate for Discharge Goal: Diagnostic test results will improve Outcome: Adequate for Discharge Goal: Respiratory complications will improve Outcome: Adequate for Discharge Goal: Cardiovascular complication will be avoided Outcome: Adequate for Discharge   Problem: Activity: Goal: Risk for activity intolerance will decrease Outcome: Adequate for Discharge   Problem: Nutrition: Goal: Adequate nutrition will be maintained Outcome: Adequate for Discharge   Problem: Coping: Goal: Level of anxiety will decrease Outcome: Adequate for Discharge   Problem: Elimination: Goal: Will not experience complications related to bowel motility Outcome: Adequate for Discharge Goal: Will not experience complications related to urinary retention Outcome: Adequate for Discharge   Problem: Pain Managment: Goal: General experience of comfort will improve Outcome: Adequate for Discharge   Problem: Safety: Goal: Ability to remain free from injury will improve Outcome: Adequate for Discharge   Problem: Skin Integrity: Goal: Risk for impaired skin integrity will decrease Outcome: Adequate for Discharge   Problem: Education: Goal: Knowledge of disease and its progression will improve Outcome: Adequate for Discharge Goal: Individualized Educational Video(s) Outcome: Adequate for Discharge   Problem: Fluid Volume: Goal: Compliance with  measures to maintain balanced fluid volume will improve Outcome: Adequate for Discharge   Problem: Health Behavior/Discharge Planning: Goal: Ability to manage health-related needs will improve Outcome: Adequate for Discharge   Problem: Nutritional: Goal: Ability to make healthy dietary choices will improve Outcome: Adequate for Discharge   Problem: Clinical Measurements: Goal: Complications related to the disease process, condition or treatment will be avoided or minimized Outcome: Adequate for Discharge   Problem: Fluid Volume: Goal: Hemodynamic stability will improve Outcome: Adequate for Discharge   Problem: Clinical Measurements: Goal: Diagnostic test results will improve Outcome: Adequate for Discharge Goal: Signs and symptoms of infection will decrease Outcome: Adequate for Discharge   Problem: Respiratory: Goal: Ability to maintain adequate ventilation will improve Outcome: Adequate for Discharge

## 2019-04-02 NOTE — Progress Notes (Signed)
Patient off unit via bed for HD by transport. Patient drowsy, but easily arousable, no distress noted at this time. Patient denies needs at this time.

## 2019-04-04 ENCOUNTER — Encounter (HOSPITAL_COMMUNITY): Payer: Self-pay | Admitting: Cardiovascular Disease

## 2019-04-04 DIAGNOSIS — E119 Type 2 diabetes mellitus without complications: Secondary | ICD-10-CM | POA: Diagnosis not present

## 2019-04-04 DIAGNOSIS — N186 End stage renal disease: Secondary | ICD-10-CM | POA: Diagnosis not present

## 2019-04-04 DIAGNOSIS — D631 Anemia in chronic kidney disease: Secondary | ICD-10-CM | POA: Diagnosis not present

## 2019-04-04 DIAGNOSIS — N2581 Secondary hyperparathyroidism of renal origin: Secondary | ICD-10-CM | POA: Diagnosis not present

## 2019-04-06 ENCOUNTER — Ambulatory Visit: Payer: Medicare Other | Admitting: Endocrinology

## 2019-04-06 ENCOUNTER — Telehealth: Payer: Self-pay | Admitting: *Deleted

## 2019-04-06 ENCOUNTER — Other Ambulatory Visit: Payer: Self-pay | Admitting: *Deleted

## 2019-04-06 NOTE — Telephone Encounter (Signed)
Rec'd notification on patient ping portal stating pt was D/C on 04/02/19. Per pt chart hosp f/u appt has been made for 04/08/19. Called pt/wife to confirm appt. Spoke w/wife she states she made appt this am, and they are aware. Completed TCM call below.[name]  Transition Care Management Follow-up Telephone Call   Date discharged? 04/02/19   How have you been since you were released from the hospital? Wife states he is doing ok   Do you understand why you were in the hospital? YES   Do you understand the discharge instructions? YES   Where were you discharged to? Home   Items Reviewed:  Medications reviewed: YES, currently taking antibiotic cephalexin    Allergies reviewed: NO  Dietary changes reviewed: YES, heart healthy  Referrals reviewed: wife states waiting on appt w/ Dr. Watt Climes   Functional Questionnaire:   Activities of Daily Living (ADLs):   she states he are independent in the following: feeding, continence, grooming and toileting States they require assistance with the following: ambulation, bathing and hygiene and dressing   Any transportation issues/concerns? NO   Any patient concerns? NO   Confirmed importance and date/time of follow-up visits scheduled YES, appt 04/08/19  Provider Appointment booked with Dr. [name]  Confirmed with patient if condition begins to worsen call PCP or go to the ER.  Patient was given the office number and encouraged to call back with question or concerns.  : YES

## 2019-04-06 NOTE — Patient Outreach (Addendum)
[facility]) Care Management  04/06/2019  [name] 1951-06-09 [id number]   [place] Discipline Closure  Referral Date:01/16/2019 Referral Source:Transfer from [place] Reason for Referral:Continued Disease Management Insurance:Medicare   Outreach Attempt:  Received notification patient is discharged from hospital.  Was notified by Hospital Liaison to close Disease Management case at this time.  Patient's family has elected not to be followed by [facility], per note from [place].  Patient is enrolled in [facility] Discharge follow up calls.  Plan: RN Health Coach will close Disease Management Case at this time and make patient inactive with [facility]. RN Health Coach will send primary care provider Case Management Closure Letter.  [place] [phone] [email or web address]

## 2019-04-07 DIAGNOSIS — N2581 Secondary hyperparathyroidism of renal origin: Secondary | ICD-10-CM | POA: Diagnosis not present

## 2019-04-07 DIAGNOSIS — E119 Type 2 diabetes mellitus without complications: Secondary | ICD-10-CM | POA: Diagnosis not present

## 2019-04-07 DIAGNOSIS — D631 Anemia in chronic kidney disease: Secondary | ICD-10-CM | POA: Diagnosis not present

## 2019-04-07 DIAGNOSIS — N186 End stage renal disease: Secondary | ICD-10-CM | POA: Diagnosis not present

## 2019-04-08 ENCOUNTER — Encounter: Payer: Self-pay | Admitting: Internal Medicine

## 2019-04-08 ENCOUNTER — Other Ambulatory Visit (INDEPENDENT_AMBULATORY_CARE_PROVIDER_SITE_OTHER): Payer: Medicare Other

## 2019-04-08 ENCOUNTER — Other Ambulatory Visit: Payer: Self-pay | Admitting: Cardiovascular Disease

## 2019-04-08 ENCOUNTER — Other Ambulatory Visit: Payer: Self-pay

## 2019-04-08 ENCOUNTER — Ambulatory Visit (INDEPENDENT_AMBULATORY_CARE_PROVIDER_SITE_OTHER): Payer: Medicare Other | Admitting: Internal Medicine

## 2019-04-08 VITALS — BP 112/74 | HR 91 | Temp 98.4°F | Ht 73.0 in | Wt 224.0 lb

## 2019-04-08 DIAGNOSIS — R6520 Severe sepsis without septic shock: Secondary | ICD-10-CM

## 2019-04-08 DIAGNOSIS — A419 Sepsis, unspecified organism: Secondary | ICD-10-CM

## 2019-04-08 DIAGNOSIS — N184 Chronic kidney disease, stage 4 (severe): Secondary | ICD-10-CM

## 2019-04-08 DIAGNOSIS — E1122 Type 2 diabetes mellitus with diabetic chronic kidney disease: Secondary | ICD-10-CM

## 2019-04-08 DIAGNOSIS — R296 Repeated falls: Secondary | ICD-10-CM | POA: Diagnosis not present

## 2019-04-08 LAB — CBC WITH DIFFERENTIAL/PLATELET
Basophils Absolute: 0.1 10*3/uL (ref 0.0–0.1)
Basophils Relative: 1 % (ref 0.0–3.0)
Eosinophils Absolute: 0.2 10*3/uL (ref 0.0–0.7)
Eosinophils Relative: 2.6 % (ref 0.0–5.0)
HCT: 32.6 % — ABNORMAL LOW (ref 39.0–52.0)
Hemoglobin: 10.7 g/dL — ABNORMAL LOW (ref 13.0–17.0)
Lymphocytes Relative: 19.8 % (ref 12.0–46.0)
Lymphs Abs: 1.7 10*3/uL (ref 0.7–4.0)
MCHC: 32.8 g/dL (ref 30.0–36.0)
MCV: 100.1 fl — ABNORMAL HIGH (ref 78.0–100.0)
Monocytes Absolute: 1.2 10*3/uL — ABNORMAL HIGH (ref 0.1–1.0)
Monocytes Relative: 13.3 % — ABNORMAL HIGH (ref 3.0–12.0)
Neutro Abs: 5.6 10*3/uL (ref 1.4–7.7)
Neutrophils Relative %: 63.3 % (ref 43.0–77.0)
Platelets: 405 10*3/uL — ABNORMAL HIGH (ref 150.0–400.0)
RBC: 3.26 Mil/uL — ABNORMAL LOW (ref 4.22–5.81)
RDW: 17 % — ABNORMAL HIGH (ref 11.5–15.5)
WBC: 8.8 10*3/uL (ref 4.0–10.5)

## 2019-04-08 LAB — BASIC METABOLIC PANEL
BUN: 39 mg/dL — ABNORMAL HIGH (ref 6–23)
CO2: 29 mEq/L (ref 19–32)
Calcium: 8.8 mg/dL (ref 8.4–10.5)
Chloride: 95 mEq/L — ABNORMAL LOW (ref 96–112)
Creatinine, Ser: 7.71 mg/dL (ref 0.40–1.50)
GFR: 8.46 mL/min — CL (ref >60.00)
Glucose, Bld: 166 mg/dL — ABNORMAL HIGH (ref 70–99)
Potassium: 3.8 mEq/L (ref 3.5–5.1)
Sodium: 140 mEq/L (ref 135–145)

## 2019-04-08 LAB — MISC LABCORP TEST (SEND OUT): Labcorp test code: 182261

## 2019-04-08 LAB — HEPATIC FUNCTION PANEL
ALT: 13 U/L (ref 0–53)
AST: 24 U/L (ref 0–37)
Albumin: 4.1 g/dL (ref 3.5–5.2)
Alkaline Phosphatase: 198 U/L — ABNORMAL HIGH (ref 39–117)
Bilirubin, Direct: 0.2 mg/dL (ref 0.0–0.3)
Total Bilirubin: 0.5 mg/dL (ref 0.2–1.2)
Total Protein: 7.8 g/dL (ref 6.0–8.3)

## 2019-04-08 MED ORDER — PRALUENT 150 MG/ML ~~LOC~~ SOAJ: 1.0000 "pen " | SUBCUTANEOUS | 3 refills | Status: DC

## 2019-04-08 NOTE — Assessment & Plan Note (Signed)
stable overall by history and exam, recent data reviewed with pt, and pt to continue medical treatment as before,  to f/u any worsening symptoms or concerns Lab Results  Component Value Date   HGBA1C 6.9 (A) 11/19/2018

## 2019-04-08 NOTE — Patient Instructions (Addendum)
OK to change the midorine 10 mg to 10 mg three times per day only for BP at home < 120/80, and before dialysis.   Please continue all other medications as before, and refills have been done if requested.  Please have the pharmacy call with any other refills you may need.  Please continue your efforts at being more active, low cholesterol diet, and weight control.  Please keep your appointments with your specialists as you may have planned  Please go to the LAB in the Basement (turn left off the elevator) for the tests to be done today  You will be contacted by phone if any changes need to be made immediately.  Otherwise, you will receive a letter about your results with an explanation, but please check with MyChart first.  Please remember to sign up for MyChart if you have not done so, as this will be important to you in the future with finding out test results, communicating by private email, and scheduling acute appointments online when needed.  Please return in 3 months, or sooner if needed

## 2019-04-08 NOTE — Assessment & Plan Note (Signed)
Ok to change the midodrine 10 mg to prior to HD and only otherwise for BP at home < 120/80; to start PT soon, for labs as ordered

## 2019-04-08 NOTE — Telephone Encounter (Signed)
°*  STAT* If patient is at the pharmacy, call can be transferred to refill team.   1. Which medications need to be refilled? (please list name of each medication and dose if known) Alirocumab (PRALUENT) 150 MG/ML SOPN  2. Which pharmacy/location (including street and city if local pharmacy) is medication to be sent to? [facility] patient [facility]untion  [phone]  1. Do they need a 30 day or 90 day supply? 90 day

## 2019-04-08 NOTE — Telephone Encounter (Signed)
Refill sent in

## 2019-04-08 NOTE — Assessment & Plan Note (Addendum)
Resolved, answered wife questions regarding e coli source, how it occurred and reinforced next steps, for lab f/u as requested

## 2019-04-08 NOTE — Progress Notes (Signed)
Subjective:    Patient ID: [name], male    DOB: 1951-02-27, 68 y.o.   MRN: [id number]  HPI  68 year old male with history of end-stage renal disease on hemodialysis, dementia, multiple admissions for sepsis and bacteremia presented on 03/26/2019 with hypotension, blood pressure in the 70s along with low-grade fever. He was started on broad-spectrum antibiotics. He was found to have E. coli bacteremia. MRCP showed diffuse biliary ductal dilatation with biliary stones. He underwent ERCP with subsequent biliary sphincterotomy, removal of stones and plastic stent placement in common bile duct.  He was discharged on oral antibiotics - keflex x 6 days.  Patient will need repeat ERCP as an outpatient for removal of stent with Dr Magod/GI.   Has appt July 27 with GI Today states "Im still sick" with generalized weakness and requires help to get in an out of car.  Did spent 6 days hospd, just hasnt bounced back well this time, though did make it HD with family help.   Has home PT to start July 10. No overt bleeding. Still with HD.  Denies high fever, chills, n/v and abd pain except has occasional "small pains"  .  Had small diarrhea actually loose stools attributed to antibiotics.  No blood.  Pt denies chest pain, increased sob or doe, wheezing, orthopnea, PND, increased LE swelling, palpitations, dizziness or syncope, except for a little "sob".  Also occasionally leg cramps, and right finger pain that seems to radiate to the foot.   Bp this AM > 190, wondering if needs to take the midodrine now scheduled 10 tid.  Also Denies worsening reflux, abd pain, dysphagia, n/v, other bowel change or blood.  Past Medical History:  Diagnosis Date  . Acute encephalopathy 10/04/2018  . Allergic rhinitis, cause unspecified 02/24/2014  . Anemia 06/16/2011  . BENIGN PROSTATIC HYPERTROPHY 10/14/2009  . CAD, NATIVE VESSEL 02/06/2009   a. 06/2007 s/p Taxus DES to the RCA;  b. 08/2016 NSTEMI in setting of SVT/PCI: LM 30ost,  LAD 41m (3.0x16 Synergy DES), LCX 33m, OM1 60, RI 40, RCA 70p/m, 51m - not amenable to PCI.  [name] Cervical radiculopathy, chronic 02/23/2016   Right c5-6 by NCS/EMG  . Chest pain 08/09/2015  . Chronic combined systolic (congestive) and diastolic (congestive) heart failure ([place])    a. 10/2016 Echo: EF 40-45%, Gr2 DD. mildly dil LA.  [name] COLONIC POLYPS, HX OF 10/14/2009  . Dementia ([place]) [id number]  . Depression 09/24/2015  . DIABETES MELLITUS, TYPE II 02/01/2010  . DYSLIPIDEMIA 06/18/2007  . ESRD (end stage renal disease) on dialysis ([place]) 08/04/2010   ESRD due to DM adn HTN, started HD in 2011. As of 2019 has a R arm graft and gets HD on TTS sched  . FOOT PAIN 08/12/2008  . GAIT DISTURBANCE 03/03/2010  . GASTROENTERITIS, VIRAL 10/14/2009  . GERD 06/18/2007  . GOITER, MULTINODULAR 12/26/2007  . GOUT 06/18/2007  . GYNECOMASTIA 07/17/2010  . Hemodialysis access, fistula mature [facility])    Dialysis [facility] [place] Right upper arm fistula  . Hyperlipidemia 10/16/2011  . Hyperparathyroidism, secondary ([place]) 06/16/2011  . HYPERTENSION 06/18/2007  . Hyperthyroidism   . Hypocalcemia 06/07/2010  . Ischemic cardiomyopathy    a. 10/2016 Echo: EF 40-45%.  . Lumbar stenosis with neurogenic claudication   . ONYCHOMYCOSIS, TOENAILS 12/26/2007  . OSA on CPAP 10/16/2011  . Other malaise and fatigue 11/24/2009  . PERIPHERAL NEUROPATHY 06/18/2007  . Prostate cancer ([place])   . PSVT (paroxysmal supraventricular tachycardia) ([place])  a. 56/4332 complicated by NSTEMI;  b. 11/2016 Treated w/ adenosine in ED;  c. 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  [name] PULMONARY NODULE, RIGHT LOWER LOBE 06/08/2009  . Sleep apnea    cpap machine and o2  . TRANSAMINASES, SERUM, ELEVATED 02/01/2010  . Transfusion history    none recent  . Unspecified hypotension 01/30/2010   Past Surgical History:  Procedure Laterality Date  . A/V SHUNTOGRAM N/A 04/07/2018   Procedure: A/V SHUNTOGRAM;  Surgeon: Waynetta Sandy, MD;  Location: [place] CV LAB;  Service: Cardiovascular;  Laterality: N/A;  . ARTERIOVENOUS GRAFT PLACEMENT Right 2009   forearm/notes 02/01/2011  . AV FISTULA PLACEMENT  11/07/2011   Procedure: INSERTION OF ARTERIOVENOUS (AV) GORE-TEX GRAFT ARM;  Surgeon: Tinnie Gens, MD;  Location: [place];  Service: Vascular;  Laterality: Left;  . BACK SURGERY  1998  . BALLOON DILATION N/A 11/14/2018   Procedure: BALLOON DILATION;  Surgeon: Ronnette Juniper, MD;  Location: [place];  Service: Gastroenterology;  Laterality: N/A;  . BASCILIC VEIN TRANSPOSITION Right 02/27/2013   Procedure: BASCILIC VEIN TRANSPOSITION;  Surgeon: Mal Misty, MD;  Location: [place];  Service: Vascular;  Laterality: Right;  Right Basilic Vein Transposition   . BILIARY DILATION  03/31/2019   Procedure: BILIARY DILATION;  Surgeon: Clarene Essex, MD;  Location: [facility] ENDOSCOPY;  Service: Endoscopy;;  . BILIARY STENT PLACEMENT  03/31/2019   Procedure: BILIARY STENT PLACEMENT;  Surgeon: Clarene Essex, MD;  Location: [place];  Service: Endoscopy;;  . CARDIAC CATHETERIZATION N/A 08/06/2016   Procedure: Left Heart Cath and Coronary Angiography;  Surgeon: Jolaine Artist, MD;  Location: [place] CV LAB;  Service: Cardiovascular;  Laterality: N/A;  . CARDIAC CATHETERIZATION N/A 08/07/2016   Procedure: Coronary/Graft Atherectomy-CSI LAD;  Surgeon: Peter M Martinique, MD;  Location: [place] CV LAB;  Service: Cardiovascular;  Laterality: N/A;  . CERVICAL SPINE SURGERY  2/09   "to repair nerve problems in my left arm"  . CHOLECYSTECTOMY    . COLONOSCOPY WITH PROPOFOL N/A 04/26/2017   Procedure: COLONOSCOPY WITH PROPOFOL;  Surgeon: Otis Brace, MD;  Location: [place];  Service: Gastroenterology;  Laterality: N/A;  . CORONARY ANGIOPLASTY WITH STENT PLACEMENT  06/11/2008  . CORONARY ANGIOPLASTY WITH STENT PLACEMENT  06/2007   TAXUS stent to RCA/notes 01/31/2011  . ENDOSCOPIC RETROGRADE CHOLANGIOPANCREATOGRAPHY (ERCP) WITH PROPOFOL N/A 03/31/2019    Procedure: ENDOSCOPIC RETROGRADE CHOLANGIOPANCREATOGRAPHY (ERCP) WITH PROPOFOL;  Surgeon: Clarene Essex, MD;  Location: [place];  Service: Endoscopy;  Laterality: N/A;  . ERCP N/A 11/14/2018   Procedure: ENDOSCOPIC RETROGRADE CHOLANGIOPANCREATOGRAPHY (ERCP);  Surgeon: Ronnette Juniper, MD;  Location: [place];  Service: Gastroenterology;  Laterality: N/A;  . ESOPHAGOGASTRODUODENOSCOPY  09/28/2011   Procedure: ESOPHAGOGASTRODUODENOSCOPY (EGD);  Surgeon: Missy Sabins, MD;  Location: [facility] ENDOSCOPY;  Service: Endoscopy;  Laterality: N/A;  . ESOPHAGOGASTRODUODENOSCOPY N/A 04/07/2015   Procedure: ESOPHAGOGASTRODUODENOSCOPY (EGD);  Surgeon: Teena Irani, MD;  Location: [facility];  Service: Endoscopy;  Laterality: N/A;  . ESOPHAGOGASTRODUODENOSCOPY N/A 04/19/2015   Procedure: ESOPHAGOGASTRODUODENOSCOPY (EGD);  Surgeon: Arta Silence, MD;  Location: [facility] ENDOSCOPY;  Service: Endoscopy;  Laterality: N/A;  . ESOPHAGOGASTRODUODENOSCOPY  11/14/2018   Procedure: ESOPHAGOGASTRODUODENOSCOPY (EGD);  Surgeon: Ronnette Juniper, MD;  Location: [facility] ENDOSCOPY;  Service: Gastroenterology;;  . ESOPHAGOGASTRODUODENOSCOPY (EGD) WITH PROPOFOL N/A 06/13/2018   Procedure: ESOPHAGOGASTRODUODENOSCOPY (EGD) WITH PROPOFOL;  Surgeon: Wonda Horner, MD;  Location: [facility] ENDOSCOPY;  Service: Endoscopy;  Laterality: N/A;  . FLEXIBLE SIGMOIDOSCOPY N/A 05/21/2017   Procedure: Conni Elliot;  Surgeon: Clarene Essex,  MD;  Location: [place] ENDOSCOPY;  Service: Endoscopy;  Laterality: N/A;  . FLEXIBLE SIGMOIDOSCOPY Left 07/02/2017   Procedure: FLEXIBLE SIGMOIDOSCOPY;  Surgeon: Laurence Spates, MD;  Location: [place];  Service: Endoscopy;  Laterality: Left;  . FOREIGN BODY REMOVAL  09/2003   via upper endoscopy/notes 02/12/2011  . GIVENS CAPSULE STUDY  09/30/2011   Procedure: GIVENS CAPSULE STUDY;  Surgeon: Jeryl Columbia, MD;  Location: [facility] ENDOSCOPY;  Service: Endoscopy;  Laterality: N/A;  . INSERTION OF DIALYSIS CATHETER Right 2014  .  INSERTION OF DIALYSIS CATHETER Left 02/11/2013   Procedure: INSERTION OF DIALYSIS CATHETER;  Surgeon: Conrad Proberta, MD;  Location: [place];  Service: Vascular;  Laterality: Left;  Ultrasound guided  . IR AV DIALY SHUNT INTRO NEEDLE/INTRACATH INITIAL W/PTA/IMG RIGHT Right 11/14/2018  . IR US GUIDE VASC ACCESS RIGHT  11/14/2018  . LUMBAR LAMINECTOMY/DECOMPRESSION MICRODISCECTOMY Bilateral 07/31/2017   Procedure: LAMINECTOMY AND FORAMINOTOMY- BILATERAL LUMBAR TWO- LUMBAR THREE;  Surgeon: Earnie Larsson, MD;  Location: [place];  Service: Neurosurgery;  Laterality: Bilateral;  LAMINECTOMY AND FORAMINOTOMY- BILATERAL LUMBAR 2- LUMBAR 3  . PERIPHERAL VASCULAR BALLOON ANGIOPLASTY  04/07/2018   Procedure: PERIPHERAL VASCULAR BALLOON ANGIOPLASTY;  Surgeon: Waynetta Sandy, MD;  Location: [place] CV LAB;  Service: Cardiovascular;;  RUE AVF  . REMOVAL OF A DIALYSIS CATHETER Right 02/11/2013   Procedure: REMOVAL OF A DIALYSIS CATHETER;  Surgeon: Conrad Azure, MD;  Location: [place];  Service: Vascular;  Laterality: Right;  . REMOVAL OF STONES  11/14/2018   Procedure: REMOVAL OF STONES;  Surgeon: Ronnette Juniper, MD;  Location: [facility] ENDOSCOPY;  Service: Gastroenterology;;  . REMOVAL OF STONES  03/31/2019   Procedure: REMOVAL OF STONES;  Surgeon: Clarene Essex, MD;  Location: [place];  Service: Endoscopy;;  . Azzie Almas DILATION N/A 04/07/2015   Procedure: Azzie Almas DILATION;  Surgeon: Teena Irani, MD;  Location: [facility] ENDOSCOPY;  Service: Endoscopy;  Laterality: N/A;  . SHUNTOGRAM N/A 09/20/2011   Procedure: Earney Mallet;  Surgeon: Conrad Overlea, MD;  Location: [facility] CATH LAB;  Service: Cardiovascular;  Laterality: N/A;  . SPHINCTEROTOMY  11/14/2018   Procedure: SPHINCTEROTOMY;  Surgeon: Ronnette Juniper, MD;  Location: [facility] ENDOSCOPY;  Service: Gastroenterology;;  . Joan Mayans  03/31/2019   Procedure: SPHINCTEROTOMY;  Surgeon: Clarene Essex, MD;  Location: [place];  Service: Endoscopy;;  . SVT ABLATION N/A 11/26/2016   Procedure: SVT  Ablation;  Surgeon: Will Meredith Leeds, MD;  Location: [place] CV LAB;  Service: Cardiovascular;  Laterality: N/A;  . TEE WITHOUT CARDIOVERSION N/A 08/25/2018   Procedure: TRANSESOPHAGEAL ECHOCARDIOGRAM (TEE);  Surgeon: Dorothy Spark, MD;  Location: [place];  Service: Cardiovascular;  Laterality: N/A;  . TEE WITHOUT CARDIOVERSION N/A 04/02/2019   Procedure: TRANSESOPHAGEAL ECHOCARDIOGRAM (TEE);  Surgeon: Josue Hector, MD;  Location: [facility] ENDOSCOPY;  Service: Cardiovascular;  Laterality: N/A;  . TONSILLECTOMY    . TOTAL KNEE ARTHROPLASTY Right 08/02/2015   Procedure: TOTAL KNEE ARTHROPLASTY;  Surgeon: Renette Butters, MD;  Location: [place];  Service: Orthopedics;  Laterality: Right;  . VENOGRAM N/A 01/26/2013   Procedure: VENOGRAM;  Surgeon: Angelia Mould, MD;  Location: [facility] CATH LAB;  Service: Cardiovascular;  Laterality: N/A;    reports that he quit smoking about 13 years ago. His smoking use included cigarettes. He has a 25.00 pack-year smoking history. He quit smokeless tobacco use about 13 years ago. He reports that he does not drink alcohol or use drugs. family history includes Diabetes in his father; Healthy in his child,  child, and child; Heart disease in his father and sister; Hypertension in his father; Kidney failure in his father; Thyroid nodules in his sister. Allergies  Allergen Reactions  . Statins Other (See Comments)    Weak muscles  . Ciprofloxacin Rash   Current Outpatient Medications on File Prior to Visit  Medication Sig Dispense Refill  . acetaminophen (TYLENOL) 325 MG tablet Take 650 mg by mouth every 6 (six) hours as needed for mild pain or headache.     . Alirocumab (PRALUENT) 150 MG/ML SOPN Inject 150 mg into the skin every 14 (fourteen) days. 2 pen 12  . allopurinol (ZYLOPRIM) 100 MG tablet Take 1 tablet by mouth once daily 90 tablet 0  . aspirin EC 325 MG tablet Take 1 tablet (325 mg total) by mouth daily. 30 tablet 0  . betamethasone  dipropionate (DIPROLENE) 0.05 % cream Apply 1 application topically daily as needed (leg sores).    . cephALEXin (KEFLEX) 500 MG capsule Take 1 capsule (500 mg total) by mouth 2 (two) times daily for 6 days. 12 capsule 0  . citalopram (CELEXA) 10 MG tablet Take 1 tablet (10 mg total) by mouth at bedtime. 90 tablet 3  . colestipol (COLESTID) 1 g tablet Take 3 tablets (3 g total) by mouth daily. 90 tablet 11  . doxercalciferol ([name]) 4 MCG/2ML injection Inject 1 mL (2 mcg total) into the vein Every Tuesday,Thursday,and Saturday with dialysis. 2 mL 0  . ezetimibe (ZETIA) 10 MG tablet TAKE ONE TABLET BY MOUTH ONCE DAILY (Patient taking differently: Take 10 mg by mouth daily. ) 90 tablet 3  . ferric citrate (AURYXIA) 1 GM 210 MG(Fe) tablet Take 2 tablets (420 mg total) by mouth 3 (three) times daily with meals. 60 tablet 0  . gabapentin (NEURONTIN) 300 MG capsule TAKE 1 TO 2 CAPSULES BY MOUTH AT BEDTIME AS NEEDED FOR PAIN AND FOR SLEEP (Patient taking differently: Take 600 mg by mouth at bedtime. ) 180 capsule 0  . hydroxypropyl methylcellulose / hypromellose (ISOPTO TEARS / GONIOVISC) 2.5 % ophthalmic solution Place 1 drop into both eyes 3 (three) times daily as needed for dry eyes. 15 mL 11  . loperamide (IMODIUM) 2 MG capsule Take 1 capsule (2 mg total) by mouth 2 (two) times daily as needed for diarrhea or loose stools. 14 capsule 0  . methimazole (TAPAZOLE) 5 MG tablet Take 0.5 tablets (2.5 mg total) by mouth daily. 45 tablet 3  . midodrine (PROAMATINE) 10 MG tablet Take 1 tablet (10 mg total) by mouth 3 (three) times daily with meals. 90 tablet 0  . multivitamin (RENA-VIT) TABS tablet Take 1 tablet by mouth daily.     . pantoprazole (PROTONIX) 40 MG tablet Take 40 mg by mouth at bedtime.     . polyethylene glycol (MIRALAX / GLYCOLAX) 17 g packet Take 17 g by mouth daily as needed for mild constipation.    . SENSIPAR 90 MG tablet Take 90 mg by mouth 2 (two) times daily.    . traMADol (ULTRAM) 50  MG tablet Take 1 tablet (50 mg total) by mouth every 6 (six) hours as needed. 14 tablet 0  . zolpidem (AMBIEN) 5 MG tablet Take 1 tablet (5 mg total) by mouth at bedtime as needed. for sleep 90 tablet 1   No current facility-administered medications on file prior to visit.    Review of Systems  Constitutional: Negative for other unusual diaphoresis or sweats HENT: Negative for ear discharge or swelling Eyes: Negative for  other worsening visual disturbances Respiratory: Negative for stridor or other swelling  Gastrointestinal: Negative for worsening distension or other blood Genitourinary: Negative for retention or other urinary change Musculoskeletal: Negative for other MSK pain or swelling Skin: Negative for color change or other new lesions Neurological: Negative for worsening tremors and other numbness  Psychiatric/Behavioral: Negative for worsening agitation or other fatigue All other system neg per pt    Objective:   Physical Exam BP 112/74   Pulse 91   Temp 98.4 F (36.9 C) (Oral)   Ht 6\' 1"  (1.854 m)   Wt 224 lb (101.6 kg)   SpO2 91%   BMI 29.55 kg/m  VS noted,  Constitutional: Pt appears in NAD HENT: Head: NCAT.  Right Ear: External ear normal.  Left Ear: External ear normal.  Eyes: . Pupils are equal, round, and reactive to light. Conjunctivae and EOM are normal Nose: without d/c or deformity Neck: Neck supple. Gross normal ROM Cardiovascular: Normal rate and regular rhythm.   Pulmonary/Chest: Effort normal and breath sounds without rales or wheezing.  Abd:  Soft, NT, ND, + BS, no organomegaly Neurological: Pt is alert. At baseline orientation, motor grossly intact, has gen'd weakness, slow to walk with walker but does not require assist Skin: Skin is warm. No rashes, other new lesions, no LE edema Psychiatric: Pt behavior is normal without agitation  No other exam findings Lab Results  Component Value Date   WBC 8.3 04/02/2019   HGB 10.0 (L) 04/02/2019   HCT  30.4 (L) 04/02/2019   PLT 224 04/02/2019   GLUCOSE 106 (H) 04/02/2019   CHOL 171 10/04/2018   TRIG 185 (H) 10/04/2018   HDL 66 10/04/2018   LDLDIRECT 136.0 07/16/2014   LDLCALC 68 10/04/2018   ALT 115 (H) 04/01/2019   AST 96 (H) 04/01/2019   NA 140 04/02/2019   K 4.1 04/02/2019   CL 101 04/02/2019   CREATININE 5.16 (H) 04/02/2019   BUN 18 04/02/2019   CO2 25 04/02/2019   TSH 0.978 11/11/2018   PSA 3.67 08/30/2014   INR 0.99 11/14/2018   HGBA1C 6.9 (A) 11/19/2018      Assessment & Plan:

## 2019-04-09 DIAGNOSIS — N2581 Secondary hyperparathyroidism of renal origin: Secondary | ICD-10-CM | POA: Diagnosis not present

## 2019-04-09 DIAGNOSIS — E119 Type 2 diabetes mellitus without complications: Secondary | ICD-10-CM | POA: Diagnosis not present

## 2019-04-09 DIAGNOSIS — D631 Anemia in chronic kidney disease: Secondary | ICD-10-CM | POA: Diagnosis not present

## 2019-04-09 DIAGNOSIS — N186 End stage renal disease: Secondary | ICD-10-CM | POA: Diagnosis not present

## 2019-04-10 ENCOUNTER — Telehealth: Payer: Self-pay

## 2019-04-10 DIAGNOSIS — I5042 Chronic combined systolic (congestive) and diastolic (congestive) heart failure: Secondary | ICD-10-CM | POA: Diagnosis not present

## 2019-04-10 DIAGNOSIS — T82898D Other specified complication of vascular prosthetic devices, implants and grafts, subsequent encounter: Secondary | ICD-10-CM | POA: Diagnosis not present

## 2019-04-10 DIAGNOSIS — A0472 Enterocolitis due to Clostridium difficile, not specified as recurrent: Secondary | ICD-10-CM | POA: Diagnosis not present

## 2019-04-10 DIAGNOSIS — Z48815 Encounter for surgical aftercare following surgery on the digestive system: Secondary | ICD-10-CM | POA: Diagnosis not present

## 2019-04-10 DIAGNOSIS — B9689 Other specified bacterial agents as the cause of diseases classified elsewhere: Secondary | ICD-10-CM | POA: Diagnosis not present

## 2019-04-10 DIAGNOSIS — I132 Hypertensive heart and chronic kidney disease with heart failure and with stage 5 chronic kidney disease, or end stage renal disease: Secondary | ICD-10-CM | POA: Diagnosis not present

## 2019-04-10 NOTE — Telephone Encounter (Signed)
Verbal orders given via VM  Copied from [place] [id number]. Topic: Quick Communication - Home Health Verbal Orders >> Apr 10, 2019  8:06 AM Parke Poisson wrote: Caller/Agency: Gennaro Africa with [facility] Number: [phone] Requesting  PT twice a week for 8 weeks Frequency:PT twice a week for 8 weeks

## 2019-04-11 DIAGNOSIS — E119 Type 2 diabetes mellitus without complications: Secondary | ICD-10-CM | POA: Diagnosis not present

## 2019-04-11 DIAGNOSIS — N186 End stage renal disease: Secondary | ICD-10-CM | POA: Diagnosis not present

## 2019-04-11 DIAGNOSIS — N2581 Secondary hyperparathyroidism of renal origin: Secondary | ICD-10-CM | POA: Diagnosis not present

## 2019-04-11 DIAGNOSIS — D631 Anemia in chronic kidney disease: Secondary | ICD-10-CM | POA: Diagnosis not present

## 2019-04-13 ENCOUNTER — Other Ambulatory Visit: Payer: Self-pay

## 2019-04-13 DIAGNOSIS — H25813 Combined forms of age-related cataract, bilateral: Secondary | ICD-10-CM | POA: Diagnosis not present

## 2019-04-13 DIAGNOSIS — H04123 Dry eye syndrome of bilateral lacrimal glands: Secondary | ICD-10-CM | POA: Diagnosis not present

## 2019-04-13 DIAGNOSIS — E119 Type 2 diabetes mellitus without complications: Secondary | ICD-10-CM | POA: Diagnosis not present

## 2019-04-13 LAB — HM DIABETES EYE EXAM

## 2019-04-13 NOTE — Patient Outreach (Signed)
[facility]) Care Management  04/13/2019  JABIER DEESE 01-Feb-1951 [id number]   EMMI- General Discharge RED ON EMMI ALERT Day # 4 Date: 04/11/2019 Red Alert Reason: Know who to call about changes in condition? no  Outreach attempt: spoke with wife Simuel Stebner who states that patient has an appointment that they are getting ready for and cannot talk. Advised that CM would call another day.  She verbalized understanding.    Plan: RN CM will attempt patient again within 4 business days and send letter.    Jone Baseman, RN, MSN [facility] Care Management Care Management Coordinator Direct Line [phone] Toll Free: [phone]  Fax: [phone]

## 2019-04-14 ENCOUNTER — Other Ambulatory Visit: Payer: Self-pay

## 2019-04-14 DIAGNOSIS — I255 Ischemic cardiomyopathy: Secondary | ICD-10-CM | POA: Diagnosis not present

## 2019-04-14 DIAGNOSIS — E119 Type 2 diabetes mellitus without complications: Secondary | ICD-10-CM | POA: Diagnosis not present

## 2019-04-14 DIAGNOSIS — Z96651 Presence of right artificial knee joint: Secondary | ICD-10-CM | POA: Diagnosis not present

## 2019-04-14 DIAGNOSIS — E039 Hypothyroidism, unspecified: Secondary | ICD-10-CM | POA: Diagnosis not present

## 2019-04-14 DIAGNOSIS — J449 Chronic obstructive pulmonary disease, unspecified: Secondary | ICD-10-CM | POA: Diagnosis not present

## 2019-04-14 DIAGNOSIS — M109 Gout, unspecified: Secondary | ICD-10-CM | POA: Diagnosis not present

## 2019-04-14 DIAGNOSIS — E1151 Type 2 diabetes mellitus with diabetic peripheral angiopathy without gangrene: Secondary | ICD-10-CM | POA: Diagnosis not present

## 2019-04-14 DIAGNOSIS — Z9861 Coronary angioplasty status: Secondary | ICD-10-CM | POA: Diagnosis not present

## 2019-04-14 DIAGNOSIS — E1142 Type 2 diabetes mellitus with diabetic polyneuropathy: Secondary | ICD-10-CM | POA: Diagnosis not present

## 2019-04-14 DIAGNOSIS — F329 Major depressive disorder, single episode, unspecified: Secondary | ICD-10-CM | POA: Diagnosis not present

## 2019-04-14 DIAGNOSIS — I2510 Atherosclerotic heart disease of native coronary artery without angina pectoris: Secondary | ICD-10-CM | POA: Diagnosis not present

## 2019-04-14 DIAGNOSIS — I5042 Chronic combined systolic (congestive) and diastolic (congestive) heart failure: Secondary | ICD-10-CM | POA: Diagnosis not present

## 2019-04-14 DIAGNOSIS — N186 End stage renal disease: Secondary | ICD-10-CM | POA: Diagnosis not present

## 2019-04-14 DIAGNOSIS — D631 Anemia in chronic kidney disease: Secondary | ICD-10-CM | POA: Diagnosis not present

## 2019-04-14 DIAGNOSIS — Z87891 Personal history of nicotine dependence: Secondary | ICD-10-CM | POA: Diagnosis not present

## 2019-04-14 DIAGNOSIS — F0280 Dementia in other diseases classified elsewhere without behavioral disturbance: Secondary | ICD-10-CM | POA: Diagnosis not present

## 2019-04-14 DIAGNOSIS — M5412 Radiculopathy, cervical region: Secondary | ICD-10-CM | POA: Diagnosis not present

## 2019-04-14 DIAGNOSIS — I132 Hypertensive heart and chronic kidney disease with heart failure and with stage 5 chronic kidney disease, or end stage renal disease: Secondary | ICD-10-CM | POA: Diagnosis not present

## 2019-04-14 DIAGNOSIS — Z86010 Personal history of colonic polyps: Secondary | ICD-10-CM | POA: Diagnosis not present

## 2019-04-14 DIAGNOSIS — N2581 Secondary hyperparathyroidism of renal origin: Secondary | ICD-10-CM | POA: Diagnosis not present

## 2019-04-14 DIAGNOSIS — Z48815 Encounter for surgical aftercare following surgery on the digestive system: Secondary | ICD-10-CM | POA: Diagnosis not present

## 2019-04-14 DIAGNOSIS — Z8546 Personal history of malignant neoplasm of prostate: Secondary | ICD-10-CM | POA: Diagnosis not present

## 2019-04-14 DIAGNOSIS — E1122 Type 2 diabetes mellitus with diabetic chronic kidney disease: Secondary | ICD-10-CM | POA: Diagnosis not present

## 2019-04-14 DIAGNOSIS — Z452 Encounter for adjustment and management of vascular access device: Secondary | ICD-10-CM | POA: Diagnosis not present

## 2019-04-14 DIAGNOSIS — M48062 Spinal stenosis, lumbar region with neurogenic claudication: Secondary | ICD-10-CM | POA: Diagnosis not present

## 2019-04-14 DIAGNOSIS — I252 Old myocardial infarction: Secondary | ICD-10-CM | POA: Diagnosis not present

## 2019-04-14 DIAGNOSIS — I5082 Biventricular heart failure: Secondary | ICD-10-CM | POA: Diagnosis not present

## 2019-04-14 NOTE — Patient Outreach (Signed)
[name]) Care Management  04/14/2019  [name] 1951/07/30 [id number]    EMMI- General Discharge RED ON EMMI ALERT Day # 4 Date:04/11/2019 Red Alert Reason:Know who to call about changes in condition? no  Outreach attempt: No answer.  HIPAA compliant voice message left.    Plan: RN CM will attempt again within 4 business days.  Jone Baseman, RN, MSN [place] [facility] Care Management Coordinator Direct Line [phone] Cell [phone] Toll Free: [phone]  Fax: [phone]

## 2019-04-15 ENCOUNTER — Other Ambulatory Visit: Payer: Self-pay

## 2019-04-15 NOTE — Patient Outreach (Signed)
[facility]) Care Management  04/15/2019  [name] 05-21-1951 [id number]   EMMI-General Discharge RED ON EMMI ALERT Day #4 Date:04/11/2019 Red Alert Reason:Know who to call about changes in condition? no  Outreach attempt: Telephone call to wife [name].  She is able to verify HIPAA.  She states that patient is doing ok but is tired after dialysis, which is usual.  She states that patient saw PCP recently.  Addressed red alerts and wife knows to contact his physician about any changes in condition or if emergency getting him to the hospital.  Wife states she has a lot on her plate right now in helping with patient.  She is appreciative of call but declined further support.  Plan: RN CM will close case.  Letter previously sent for future reference.   Jone Baseman, RN, MSN [place] [facility] Care Management Coordinator Direct Line [phone] Cell [phone] Toll Free: [phone]  Fax: [phone]

## 2019-04-16 DIAGNOSIS — N2581 Secondary hyperparathyroidism of renal origin: Secondary | ICD-10-CM | POA: Diagnosis not present

## 2019-04-16 DIAGNOSIS — E119 Type 2 diabetes mellitus without complications: Secondary | ICD-10-CM | POA: Diagnosis not present

## 2019-04-16 DIAGNOSIS — N186 End stage renal disease: Secondary | ICD-10-CM | POA: Diagnosis not present

## 2019-04-16 DIAGNOSIS — D631 Anemia in chronic kidney disease: Secondary | ICD-10-CM | POA: Diagnosis not present

## 2019-04-17 ENCOUNTER — Ambulatory Visit: Payer: Medicare Other | Admitting: *Deleted

## 2019-04-17 LAB — CULTURE, BLOOD (ROUTINE X 2)

## 2019-04-18 DIAGNOSIS — N186 End stage renal disease: Secondary | ICD-10-CM | POA: Diagnosis not present

## 2019-04-18 DIAGNOSIS — D631 Anemia in chronic kidney disease: Secondary | ICD-10-CM | POA: Diagnosis not present

## 2019-04-18 DIAGNOSIS — N2581 Secondary hyperparathyroidism of renal origin: Secondary | ICD-10-CM | POA: Diagnosis not present

## 2019-04-18 DIAGNOSIS — E119 Type 2 diabetes mellitus without complications: Secondary | ICD-10-CM | POA: Diagnosis not present

## 2019-04-20 DIAGNOSIS — I5082 Biventricular heart failure: Secondary | ICD-10-CM | POA: Diagnosis not present

## 2019-04-20 DIAGNOSIS — I5042 Chronic combined systolic (congestive) and diastolic (congestive) heart failure: Secondary | ICD-10-CM | POA: Diagnosis not present

## 2019-04-20 DIAGNOSIS — E1122 Type 2 diabetes mellitus with diabetic chronic kidney disease: Secondary | ICD-10-CM | POA: Diagnosis not present

## 2019-04-20 DIAGNOSIS — Z48815 Encounter for surgical aftercare following surgery on the digestive system: Secondary | ICD-10-CM | POA: Diagnosis not present

## 2019-04-20 DIAGNOSIS — I132 Hypertensive heart and chronic kidney disease with heart failure and with stage 5 chronic kidney disease, or end stage renal disease: Secondary | ICD-10-CM | POA: Diagnosis not present

## 2019-04-20 DIAGNOSIS — N186 End stage renal disease: Secondary | ICD-10-CM | POA: Diagnosis not present

## 2019-04-21 DIAGNOSIS — M109 Gout, unspecified: Secondary | ICD-10-CM

## 2019-04-21 DIAGNOSIS — N186 End stage renal disease: Secondary | ICD-10-CM | POA: Diagnosis not present

## 2019-04-21 DIAGNOSIS — J449 Chronic obstructive pulmonary disease, unspecified: Secondary | ICD-10-CM

## 2019-04-21 DIAGNOSIS — N2581 Secondary hyperparathyroidism of renal origin: Secondary | ICD-10-CM | POA: Diagnosis not present

## 2019-04-21 DIAGNOSIS — F329 Major depressive disorder, single episode, unspecified: Secondary | ICD-10-CM

## 2019-04-21 DIAGNOSIS — Z9861 Coronary angioplasty status: Secondary | ICD-10-CM

## 2019-04-21 DIAGNOSIS — E1151 Type 2 diabetes mellitus with diabetic peripheral angiopathy without gangrene: Secondary | ICD-10-CM

## 2019-04-21 DIAGNOSIS — Z992 Dependence on renal dialysis: Secondary | ICD-10-CM

## 2019-04-21 DIAGNOSIS — E039 Hypothyroidism, unspecified: Secondary | ICD-10-CM

## 2019-04-21 DIAGNOSIS — F0280 Dementia in other diseases classified elsewhere without behavioral disturbance: Secondary | ICD-10-CM

## 2019-04-21 DIAGNOSIS — Z452 Encounter for adjustment and management of vascular access device: Secondary | ICD-10-CM

## 2019-04-21 DIAGNOSIS — I2510 Atherosclerotic heart disease of native coronary artery without angina pectoris: Secondary | ICD-10-CM

## 2019-04-21 DIAGNOSIS — Z8673 Personal history of transient ischemic attack (TIA), and cerebral infarction without residual deficits: Secondary | ICD-10-CM

## 2019-04-21 DIAGNOSIS — E119 Type 2 diabetes mellitus without complications: Secondary | ICD-10-CM | POA: Diagnosis not present

## 2019-04-21 DIAGNOSIS — I5082 Biventricular heart failure: Secondary | ICD-10-CM

## 2019-04-21 DIAGNOSIS — D631 Anemia in chronic kidney disease: Secondary | ICD-10-CM

## 2019-04-21 DIAGNOSIS — Z87891 Personal history of nicotine dependence: Secondary | ICD-10-CM

## 2019-04-21 DIAGNOSIS — Z9181 History of falling: Secondary | ICD-10-CM

## 2019-04-21 DIAGNOSIS — M5412 Radiculopathy, cervical region: Secondary | ICD-10-CM

## 2019-04-21 DIAGNOSIS — M48062 Spinal stenosis, lumbar region with neurogenic claudication: Secondary | ICD-10-CM

## 2019-04-21 DIAGNOSIS — Z86010 Personal history of colonic polyps: Secondary | ICD-10-CM

## 2019-04-21 DIAGNOSIS — I255 Ischemic cardiomyopathy: Secondary | ICD-10-CM

## 2019-04-21 DIAGNOSIS — I5042 Chronic combined systolic (congestive) and diastolic (congestive) heart failure: Secondary | ICD-10-CM

## 2019-04-21 DIAGNOSIS — I252 Old myocardial infarction: Secondary | ICD-10-CM

## 2019-04-21 DIAGNOSIS — Z8546 Personal history of malignant neoplasm of prostate: Secondary | ICD-10-CM

## 2019-04-21 DIAGNOSIS — E1122 Type 2 diabetes mellitus with diabetic chronic kidney disease: Secondary | ICD-10-CM

## 2019-04-21 DIAGNOSIS — Z48815 Encounter for surgical aftercare following surgery on the digestive system: Secondary | ICD-10-CM

## 2019-04-21 DIAGNOSIS — I132 Hypertensive heart and chronic kidney disease with heart failure and with stage 5 chronic kidney disease, or end stage renal disease: Secondary | ICD-10-CM

## 2019-04-21 DIAGNOSIS — E1142 Type 2 diabetes mellitus with diabetic polyneuropathy: Secondary | ICD-10-CM | POA: Diagnosis not present

## 2019-04-21 DIAGNOSIS — Z96651 Presence of right artificial knee joint: Secondary | ICD-10-CM

## 2019-04-22 DIAGNOSIS — Z48815 Encounter for surgical aftercare following surgery on the digestive system: Secondary | ICD-10-CM | POA: Diagnosis not present

## 2019-04-22 DIAGNOSIS — I132 Hypertensive heart and chronic kidney disease with heart failure and with stage 5 chronic kidney disease, or end stage renal disease: Secondary | ICD-10-CM | POA: Diagnosis not present

## 2019-04-22 DIAGNOSIS — I5082 Biventricular heart failure: Secondary | ICD-10-CM | POA: Diagnosis not present

## 2019-04-22 DIAGNOSIS — I5042 Chronic combined systolic (congestive) and diastolic (congestive) heart failure: Secondary | ICD-10-CM | POA: Diagnosis not present

## 2019-04-22 DIAGNOSIS — N186 End stage renal disease: Secondary | ICD-10-CM | POA: Diagnosis not present

## 2019-04-22 DIAGNOSIS — E1122 Type 2 diabetes mellitus with diabetic chronic kidney disease: Secondary | ICD-10-CM | POA: Diagnosis not present

## 2019-04-23 DIAGNOSIS — E119 Type 2 diabetes mellitus without complications: Secondary | ICD-10-CM | POA: Diagnosis not present

## 2019-04-23 DIAGNOSIS — N186 End stage renal disease: Secondary | ICD-10-CM | POA: Diagnosis not present

## 2019-04-23 DIAGNOSIS — D631 Anemia in chronic kidney disease: Secondary | ICD-10-CM | POA: Diagnosis not present

## 2019-04-23 DIAGNOSIS — N2581 Secondary hyperparathyroidism of renal origin: Secondary | ICD-10-CM | POA: Diagnosis not present

## 2019-04-25 DIAGNOSIS — D631 Anemia in chronic kidney disease: Secondary | ICD-10-CM | POA: Diagnosis not present

## 2019-04-25 DIAGNOSIS — N186 End stage renal disease: Secondary | ICD-10-CM | POA: Diagnosis not present

## 2019-04-25 DIAGNOSIS — N2581 Secondary hyperparathyroidism of renal origin: Secondary | ICD-10-CM | POA: Diagnosis not present

## 2019-04-25 DIAGNOSIS — E119 Type 2 diabetes mellitus without complications: Secondary | ICD-10-CM | POA: Diagnosis not present

## 2019-04-27 ENCOUNTER — Other Ambulatory Visit: Payer: Self-pay | Admitting: Gastroenterology

## 2019-04-27 DIAGNOSIS — Z48815 Encounter for surgical aftercare following surgery on the digestive system: Secondary | ICD-10-CM | POA: Diagnosis not present

## 2019-04-27 DIAGNOSIS — E1122 Type 2 diabetes mellitus with diabetic chronic kidney disease: Secondary | ICD-10-CM | POA: Diagnosis not present

## 2019-04-27 DIAGNOSIS — I5042 Chronic combined systolic (congestive) and diastolic (congestive) heart failure: Secondary | ICD-10-CM | POA: Diagnosis not present

## 2019-04-27 DIAGNOSIS — I132 Hypertensive heart and chronic kidney disease with heart failure and with stage 5 chronic kidney disease, or end stage renal disease: Secondary | ICD-10-CM | POA: Diagnosis not present

## 2019-04-27 DIAGNOSIS — N186 End stage renal disease: Secondary | ICD-10-CM | POA: Diagnosis not present

## 2019-04-27 DIAGNOSIS — K8050 Calculus of bile duct without cholangitis or cholecystitis without obstruction: Secondary | ICD-10-CM | POA: Diagnosis not present

## 2019-04-27 DIAGNOSIS — I5082 Biventricular heart failure: Secondary | ICD-10-CM | POA: Diagnosis not present

## 2019-04-28 DIAGNOSIS — N2581 Secondary hyperparathyroidism of renal origin: Secondary | ICD-10-CM | POA: Diagnosis not present

## 2019-04-28 DIAGNOSIS — E119 Type 2 diabetes mellitus without complications: Secondary | ICD-10-CM | POA: Diagnosis not present

## 2019-04-28 DIAGNOSIS — N186 End stage renal disease: Secondary | ICD-10-CM | POA: Diagnosis not present

## 2019-04-28 DIAGNOSIS — D631 Anemia in chronic kidney disease: Secondary | ICD-10-CM | POA: Diagnosis not present

## 2019-04-29 DIAGNOSIS — I5082 Biventricular heart failure: Secondary | ICD-10-CM | POA: Diagnosis not present

## 2019-04-29 DIAGNOSIS — I132 Hypertensive heart and chronic kidney disease with heart failure and with stage 5 chronic kidney disease, or end stage renal disease: Secondary | ICD-10-CM | POA: Diagnosis not present

## 2019-04-29 DIAGNOSIS — Z48815 Encounter for surgical aftercare following surgery on the digestive system: Secondary | ICD-10-CM | POA: Diagnosis not present

## 2019-04-29 DIAGNOSIS — N186 End stage renal disease: Secondary | ICD-10-CM | POA: Diagnosis not present

## 2019-04-29 DIAGNOSIS — E1122 Type 2 diabetes mellitus with diabetic chronic kidney disease: Secondary | ICD-10-CM | POA: Diagnosis not present

## 2019-04-29 DIAGNOSIS — I5042 Chronic combined systolic (congestive) and diastolic (congestive) heart failure: Secondary | ICD-10-CM | POA: Diagnosis not present

## 2019-04-30 DIAGNOSIS — E119 Type 2 diabetes mellitus without complications: Secondary | ICD-10-CM | POA: Diagnosis not present

## 2019-04-30 DIAGNOSIS — N186 End stage renal disease: Secondary | ICD-10-CM | POA: Diagnosis not present

## 2019-04-30 DIAGNOSIS — D631 Anemia in chronic kidney disease: Secondary | ICD-10-CM | POA: Diagnosis not present

## 2019-04-30 DIAGNOSIS — N2581 Secondary hyperparathyroidism of renal origin: Secondary | ICD-10-CM | POA: Diagnosis not present

## 2019-05-02 DIAGNOSIS — I5082 Biventricular heart failure: Secondary | ICD-10-CM | POA: Diagnosis not present

## 2019-05-02 DIAGNOSIS — D631 Anemia in chronic kidney disease: Secondary | ICD-10-CM | POA: Diagnosis not present

## 2019-05-02 DIAGNOSIS — E119 Type 2 diabetes mellitus without complications: Secondary | ICD-10-CM | POA: Diagnosis not present

## 2019-05-02 DIAGNOSIS — E1129 Type 2 diabetes mellitus with other diabetic kidney complication: Secondary | ICD-10-CM | POA: Diagnosis not present

## 2019-05-02 DIAGNOSIS — D509 Iron deficiency anemia, unspecified: Secondary | ICD-10-CM | POA: Diagnosis not present

## 2019-05-02 DIAGNOSIS — Z48815 Encounter for surgical aftercare following surgery on the digestive system: Secondary | ICD-10-CM | POA: Diagnosis not present

## 2019-05-02 DIAGNOSIS — I132 Hypertensive heart and chronic kidney disease with heart failure and with stage 5 chronic kidney disease, or end stage renal disease: Secondary | ICD-10-CM | POA: Diagnosis not present

## 2019-05-02 DIAGNOSIS — I5042 Chronic combined systolic (congestive) and diastolic (congestive) heart failure: Secondary | ICD-10-CM | POA: Diagnosis not present

## 2019-05-02 DIAGNOSIS — E1122 Type 2 diabetes mellitus with diabetic chronic kidney disease: Secondary | ICD-10-CM | POA: Diagnosis not present

## 2019-05-02 DIAGNOSIS — N186 End stage renal disease: Secondary | ICD-10-CM | POA: Diagnosis not present

## 2019-05-02 DIAGNOSIS — N2581 Secondary hyperparathyroidism of renal origin: Secondary | ICD-10-CM | POA: Diagnosis not present

## 2019-05-02 DIAGNOSIS — Z992 Dependence on renal dialysis: Secondary | ICD-10-CM | POA: Diagnosis not present

## 2019-05-04 DIAGNOSIS — I5082 Biventricular heart failure: Secondary | ICD-10-CM | POA: Diagnosis not present

## 2019-05-04 DIAGNOSIS — I132 Hypertensive heart and chronic kidney disease with heart failure and with stage 5 chronic kidney disease, or end stage renal disease: Secondary | ICD-10-CM | POA: Diagnosis not present

## 2019-05-04 DIAGNOSIS — N186 End stage renal disease: Secondary | ICD-10-CM | POA: Diagnosis not present

## 2019-05-04 DIAGNOSIS — I5042 Chronic combined systolic (congestive) and diastolic (congestive) heart failure: Secondary | ICD-10-CM | POA: Diagnosis not present

## 2019-05-04 DIAGNOSIS — Z48815 Encounter for surgical aftercare following surgery on the digestive system: Secondary | ICD-10-CM | POA: Diagnosis not present

## 2019-05-04 DIAGNOSIS — E1122 Type 2 diabetes mellitus with diabetic chronic kidney disease: Secondary | ICD-10-CM | POA: Diagnosis not present

## 2019-05-05 DIAGNOSIS — Z992 Dependence on renal dialysis: Secondary | ICD-10-CM | POA: Diagnosis not present

## 2019-05-05 DIAGNOSIS — E119 Type 2 diabetes mellitus without complications: Secondary | ICD-10-CM | POA: Diagnosis not present

## 2019-05-05 DIAGNOSIS — D631 Anemia in chronic kidney disease: Secondary | ICD-10-CM | POA: Diagnosis not present

## 2019-05-05 DIAGNOSIS — D509 Iron deficiency anemia, unspecified: Secondary | ICD-10-CM | POA: Diagnosis not present

## 2019-05-05 DIAGNOSIS — N186 End stage renal disease: Secondary | ICD-10-CM | POA: Diagnosis not present

## 2019-05-05 DIAGNOSIS — N2581 Secondary hyperparathyroidism of renal origin: Secondary | ICD-10-CM | POA: Diagnosis not present

## 2019-05-06 DIAGNOSIS — E1122 Type 2 diabetes mellitus with diabetic chronic kidney disease: Secondary | ICD-10-CM | POA: Diagnosis not present

## 2019-05-06 DIAGNOSIS — Z48815 Encounter for surgical aftercare following surgery on the digestive system: Secondary | ICD-10-CM | POA: Diagnosis not present

## 2019-05-06 DIAGNOSIS — I132 Hypertensive heart and chronic kidney disease with heart failure and with stage 5 chronic kidney disease, or end stage renal disease: Secondary | ICD-10-CM | POA: Diagnosis not present

## 2019-05-06 DIAGNOSIS — I5082 Biventricular heart failure: Secondary | ICD-10-CM | POA: Diagnosis not present

## 2019-05-06 DIAGNOSIS — I5042 Chronic combined systolic (congestive) and diastolic (congestive) heart failure: Secondary | ICD-10-CM | POA: Diagnosis not present

## 2019-05-06 DIAGNOSIS — N186 End stage renal disease: Secondary | ICD-10-CM | POA: Diagnosis not present

## 2019-05-07 DIAGNOSIS — Z992 Dependence on renal dialysis: Secondary | ICD-10-CM | POA: Diagnosis not present

## 2019-05-07 DIAGNOSIS — D631 Anemia in chronic kidney disease: Secondary | ICD-10-CM | POA: Diagnosis not present

## 2019-05-07 DIAGNOSIS — N186 End stage renal disease: Secondary | ICD-10-CM | POA: Diagnosis not present

## 2019-05-07 DIAGNOSIS — E119 Type 2 diabetes mellitus without complications: Secondary | ICD-10-CM | POA: Diagnosis not present

## 2019-05-07 DIAGNOSIS — N2581 Secondary hyperparathyroidism of renal origin: Secondary | ICD-10-CM | POA: Diagnosis not present

## 2019-05-07 DIAGNOSIS — D509 Iron deficiency anemia, unspecified: Secondary | ICD-10-CM | POA: Diagnosis not present

## 2019-05-07 IMAGING — MR MR LUMBAR SPINE W/O CM
6 of 11 series · 26 of 48 positions shown · non-contrast
Comparison: Comparison made with prior MRI from 06/28/2017.

CLINICAL DATA: Initial evaluation for fever of unknown origin,
inability to lift legs.

EXAM:
MRI THORACIC AND LUMBAR SPINE WITHOUT CONTRAST
TECHNIQUE: Multiplanar and multiecho pulse sequences of the thoracic and lumbar
spine were obtained without intravenous contrast.

[Series 14: T1 · sagittal · 6.0mm · 1.23mm/px · 3 of 9 slices shown (1 of 2)]
[im 1/9]
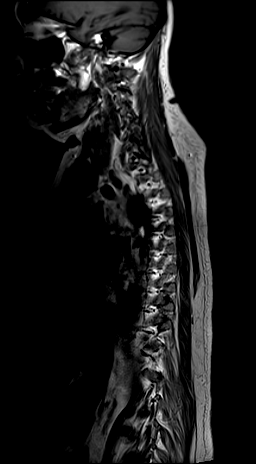
[im 5/9]
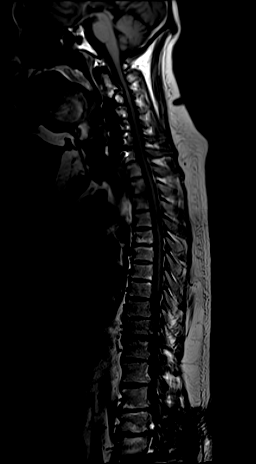
[im 9/9]
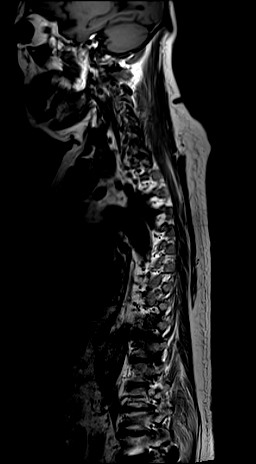

[Series 15: T2 · sagittal · 3.0mm · 0.76mm/px · 4 of 17 slices shown (1 of 4)]
[im 1/17]
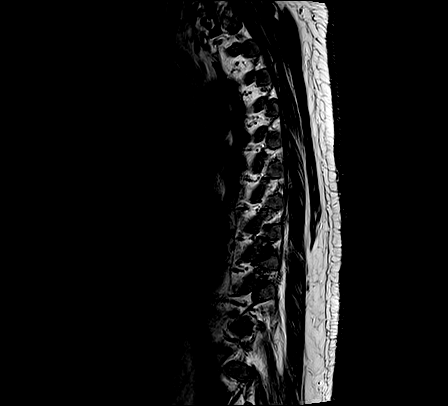
[im 6/17]
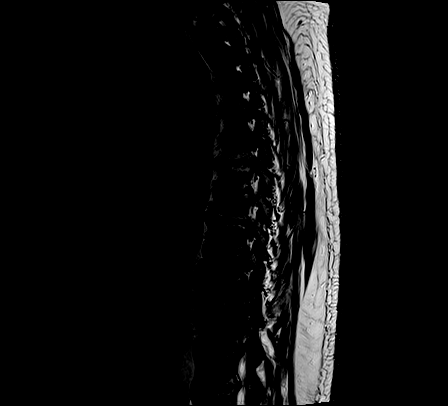
[im 11/17]
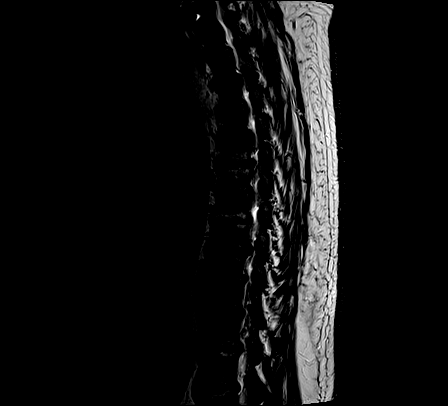
[im 17/17]
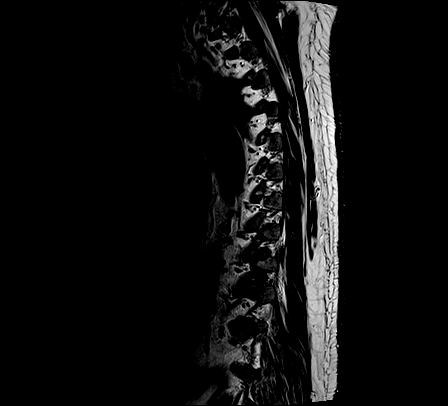

[Series 16: T2 · sagittal · 4.0mm · 0.73mm/px · 3 of 17 slices shown (2 of 4)]
[im 1/17]
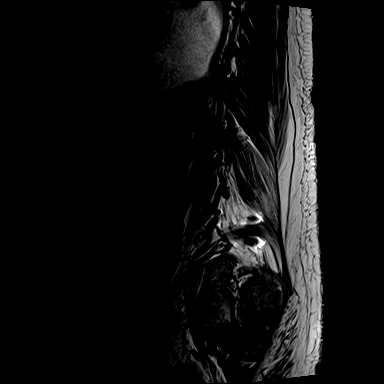
[im 9/17]
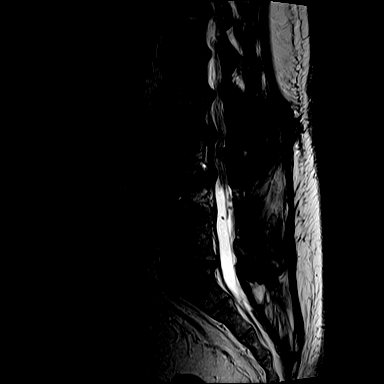
[im 17/17]
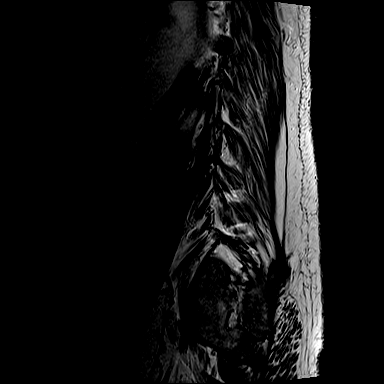

[Series 17: T1 · sagittal · 3.0mm · 0.76mm/px · 3 of 17 slices shown (2 of 2)]
[im 1/17]
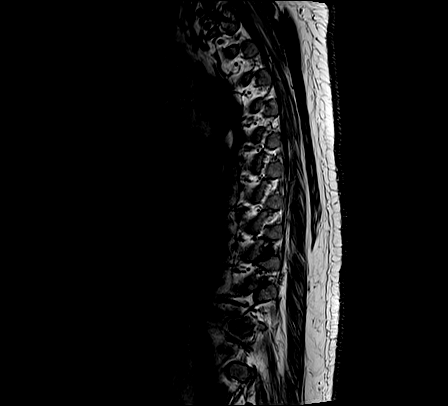
[im 9/17]
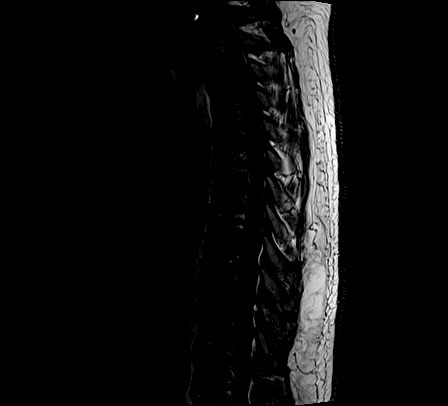
[im 17/17]
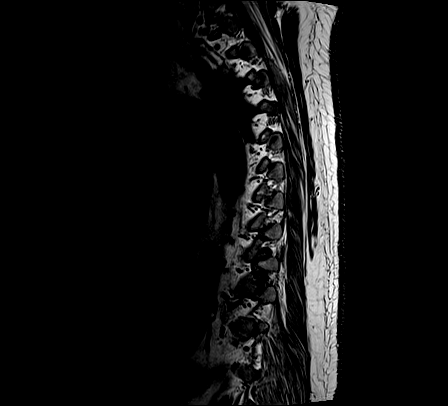

[Series 19: T2 · axial · 4.0mm · 0.59mm/px · z∈[-191,+64]mm · 7 of 39 slices shown (3 of 4)]
[im 1/39]
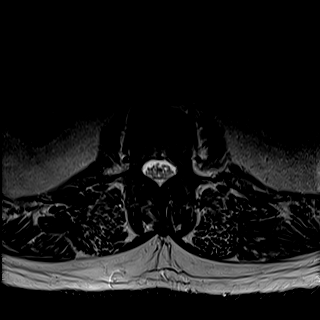
[im 7/39]
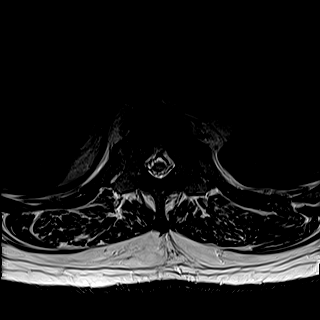
[im 13/39]
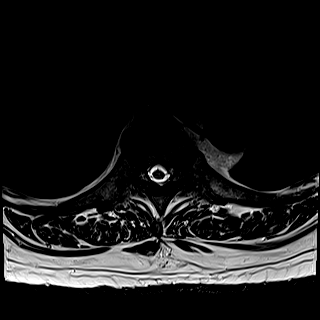
[im 20/39]
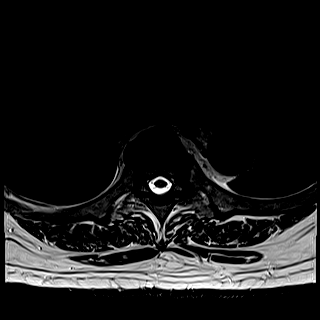
[im 26/39]
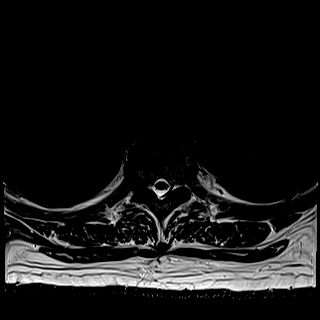
[im 32/39]
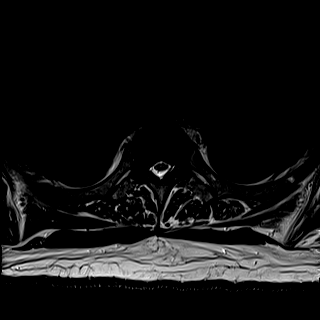
[im 39/39]
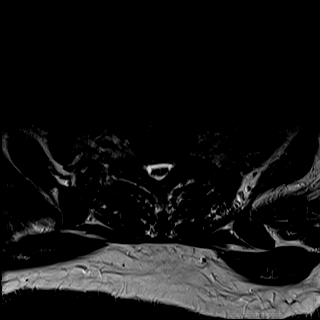

[Series 23: T2 · axial · 4.0mm · 0.57mm/px · z∈[-364,-192]mm · 6 of 35 slices shown (4 of 4)]
[im 1/35]
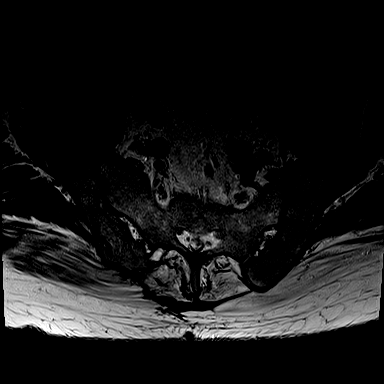
[im 7/35]
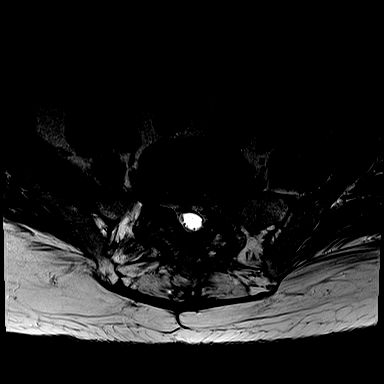
[im 14/35]
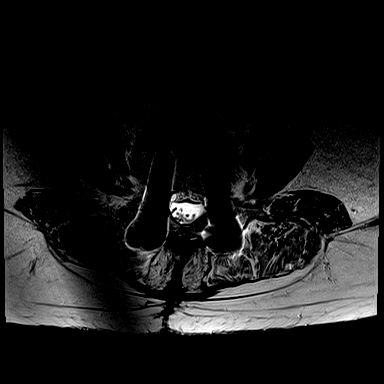
[im 21/35]
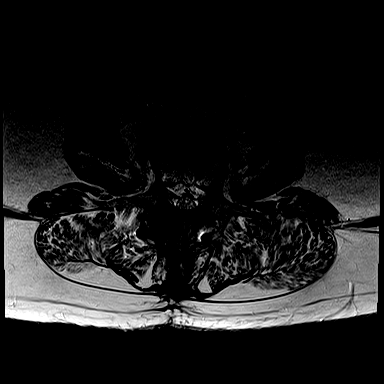
[im 28/35]
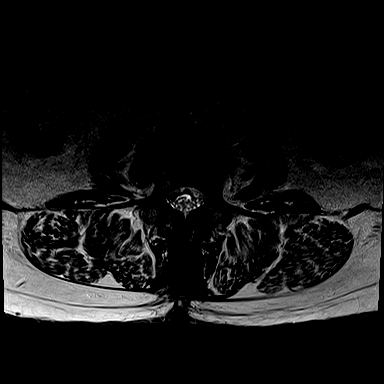
[im 35/35]
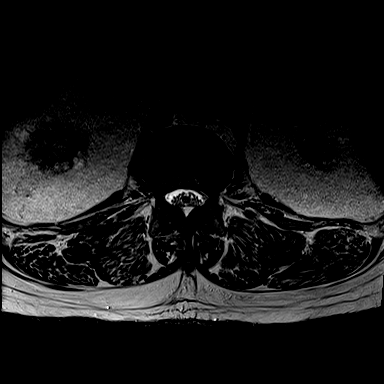

[26 of 48 positions shown; findings below may reference images not displayed]

FINDINGS: MRI THORACIC SPINE FINDINGS

Alignment: Vertebral bodies normally aligned with preservation of
the normal thoracic kyphosis. No listhesis.

Vertebrae: Vertebral body heights maintained without evidence for
acute or chronic fracture. Bone marrow signal intensity within
normal limits. Multilevel discogenic reactive endplate changes seen
throughout the thoracic spine, most notable at T2-3, T5-6, and
T10-11. No discrete or worrisome osseous lesions. No findings to
suggest discitis. Reactive marrow edema about the right T11-12 facet
almost certainly degenerative in nature (series 18, image 5). No
other abnormal marrow edema.

Cord: Question faint cord signal abnormality within the thoracic
spinal cord at the level of T10-11 (series 15, image 9). Signal
intensity within the thoracic spinal cord is otherwise within normal
limits. No epidural collections.

Paraspinal and other soft tissues: Paraspinous soft tissues
demonstrate no acute finding. Partially visualized lungs are grossly
clear. Aortic atherosclerosis noted.

Disc levels:

Underlying congenital spinal stenosis noted. Postsurgical changes
noted within the cervical spine on counter sequence.

T1-2: Bilateral facet hypertrophy. No spinal stenosis. Mild
bilateral foraminal narrowing.

T2-3: Diffuse disc bulge with bilateral facet hypertrophy. Mild
spinal stenosis. Moderate left with mild right foraminal narrowing.

T3-4: Mild disc bulge. Mild facet hypertrophy. No significant
stenosis. Foramina remain patent.

T4-5: Mild disc bulge. Mild facet hypertrophy. No significant
stenosis. Foramina remain patent.

T5-6: Diffuse disc bulge with prominent reactive endplate changes.
Facet and ligament flavum hypertrophy. Mild spinal stenosis. Mild
right neural foraminal narrowing.

T6-7: Diffuse disc bulge. Left greater than right facet hypertrophy.
No significant stenosis.

T7-8: Diffuse disc bulge. Reactive endplate changes. Bilateral facet
hypertrophy. No significant spinal stenosis. Foramina remain patent.

T8-9: Diffuse disc bulge, asymmetric to the left. Prominent reactive
endplate changes. Facet hypertrophy. Resultant mild canal stenosis.
Mild to moderate bilateral foraminal narrowing.

T9-10: Diffuse disc bulge with bilateral facet hypertrophy. Mild
spinal stenosis. Mild bilateral foraminal narrowing.

T10-11: Diffuse disc bulge with disc desiccation and reactive
endplate changes. Bilateral facet hypertrophy. Mild epidural
lipomatosis. Resultant moderate spinal stenosis possible faint cord
signal abnormality at this level (series 15, image 9). Moderate to
severe bilateral foraminal narrowing.

T11-12: Diffuse disc bulge with disc desiccation and intervertebral
disc space narrowing. Right greater than left facet hypertrophy.
Reactive edema about the right T11-12 facet likely degenerative.
Resultant moderate spinal stenosis. No definite cord signal changes.
Moderate to severe bilateral foraminal narrowing.

T12-L1: Mild disc bulge. Bilateral facet hypertrophy. Mild spinal
stenosis. Mild to moderate bilateral foraminal narrowing.

MRI LUMBAR SPINE FINDINGS

Segmentation: Normal segmentation. Lowest well-formed disc labeled
the L5-S1 level.

Alignment: 4 mm retrolisthesis of L5 on S1. Straightening of the
normal lumbar lordosis.

Vertebrae: Patient status post interbody fusion at L3-4 and L4-5,
with additional posterior fusion at L4-5. Solid arthrodesis at the
L4-5 level. Prior posterior decompression has also been performed at
L2-3 and L3-4. Vertebral body heights maintained without acute or
chronic fracture. Bone marrow signal intensity within normal limits.
No worrisome osseous lesions. Prominent reactive endplate changes
about the L1-2 through L3-4 interspaces. No other abnormal marrow
edema.

Conus medullaris and cauda equina: Conus extends to the L1 level.
Conus and cauda equina appear normal.

Paraspinal and other soft tissues: Remote postsurgical changes
present within the posterior paraspinous soft tissues. Mild
increased STIR signal intensity within the lower posterior
paraspinous soft tissues bilaterally, likely due to susceptibility
artifact, similar to previous exam. No soft tissue collections.
Bilateral renal atrophy with innumerable renal cysts noted.
Intra-abdominal aorta ectatic measuring up to 2.7 cm.

Disc levels:

Underlying congenital spinal stenosis noted.

L1-2: Diffuse disc bulge with disc desiccation and intervertebral
disc space narrowing. Reactive endplate changes with endplate
osteophytic spurring. Moderate facet hypertrophy. Resultant moderate
spinal stenosis, stable from previous. Mild to moderate bilateral L1
foraminal narrowing, also unchanged.

L2-3: Diffuse disc bulge with disc desiccation and intervertebral
disc space narrowing. Reactive endplate changes with marginal
endplate osteophytic spurring. Prior posterior decompression.
Improved moderate spinal stenosis with the thecal sac measuring 9 mm
in AP diameter, previously 6 mm. Residual bilateral facet
hypertrophy. Moderate right with moderate to severe left L2
foraminal narrowing, similar to previous.

L3-4: Prior interbody fusion. Intervertebral disc space narrowing
with endplate osseous ridging. Prior posterior decompression.
Residual moderate to severe spinal stenosis with the thecal sac
measuring 7 mm in AP diameter, stable from previous. Moderate
bilateral L3 foraminal narrowing, also unchanged.

L4-5: Prior posterior and interbody fusion with posterior
decompression. Wide patency of the thecal sac. No significant
foraminal narrowing. Appearance is stable.

L5-S1: Mild retrolisthesis. Diffuse disc bulge with intervertebral
disc space narrowing and disc desiccation. Moderate facet
hypertrophy. Resultant mild to moderate left lateral recess
narrowing without significant canal stenosis, stable. Moderate right
with severe left L5 foraminal narrowing, also unchanged.
IMPRESSION: MR THORACIC SPINE IMPRESSION

1. Multifactorial degenerative changes at T10-11 with resultant
moderate to severe spinal stenosis. Question faint cord signal
abnormality at this level, which could reflect edema and/or
myelomalacia.
2. Disc bulge with facet hypertrophy at T11-12 with resultant
moderate spinal stenosis without cord signal changes.
3. Additional multilevel acquired on congenital canal and foraminal
stenosis as detailed above, most notable at T2-3, T8-9, and T9-10.
4. Mild reactive edema about the right T11-12 facet, felt to almost
certainly be degenerative in nature.

MR LUMBAR SPINE IMPRESSION

1. No acute abnormality within the lumbar spine.
2. Postoperative changes from prior interbody and posterior fusion
with decompression at L2-3 through L5-S1 as above. Improved moderate
spinal stenosis at L2-3, with unchanged moderate to severe spinal
stenosis at L3-4. No residual stenosis at L4-5.
3. Multifactorial degenerative changes with resultant moderate to
severe bilateral foraminal narrowing throughout the lumbar spine as
above, most notable at L2, L3, and L5 on the left.

## 2019-05-07 IMAGING — CT CT ABD-PELV W/O CM
2 of 4 series · 16 of 46 positions shown, 18 images · non-contrast
Comparison: 04/22/2017

CLINICAL DATA: Abdominal pain with fever.  Confusion and weakness.

EXAM:
CT ABDOMEN AND PELVIS WITHOUT CONTRAST
TECHNIQUE: Multidetector CT imaging of the abdomen and pelvis was performed
following the standard protocol without IV contrast.

[Series 3: abd/ pelvis 5.0 i30f 2 · axial · 0.87mm/px · z∈[+727,+1122]mm · 13 of 87 slices shown, 15 images]
[im 4/87  soft-tissue]
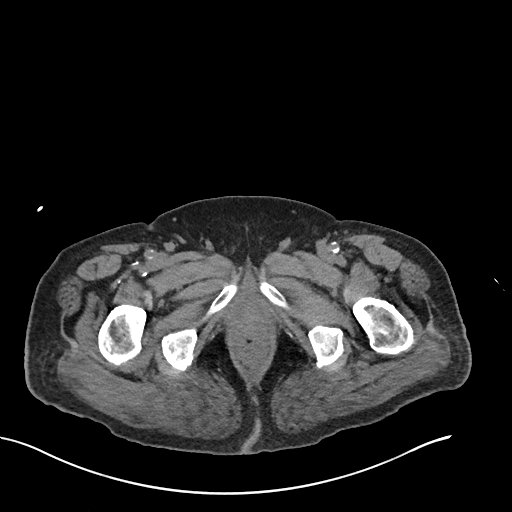
[im 4/87  bone]
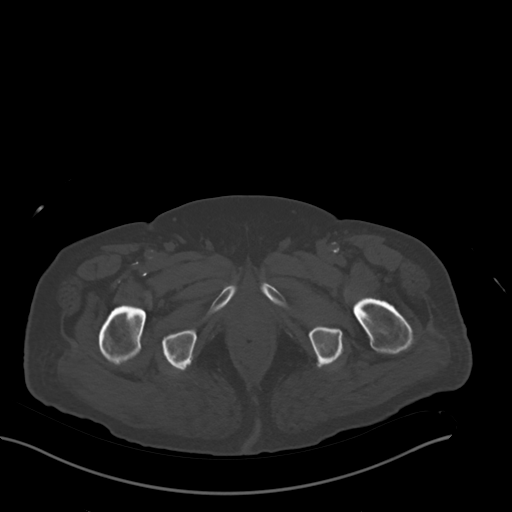
[im 12/87  soft-tissue]
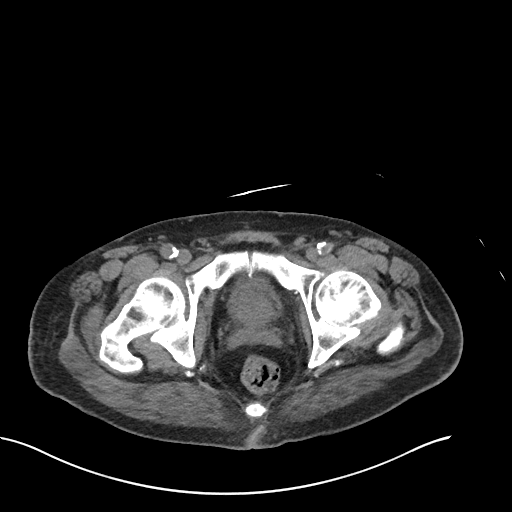
[im 19/87  soft-tissue]
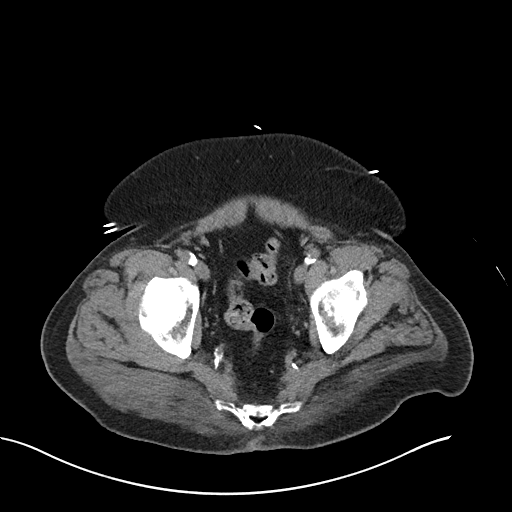
[im 23/87  soft-tissue]
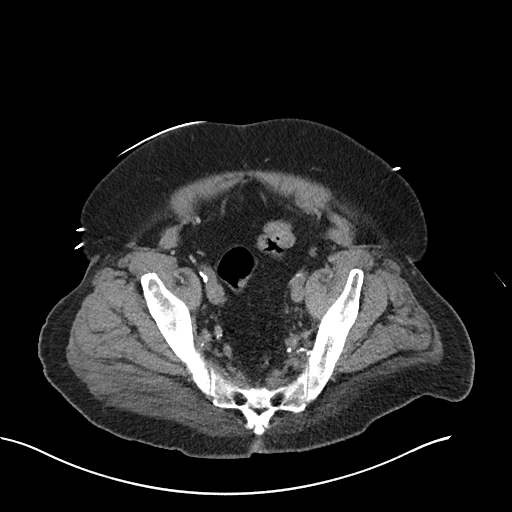
[im 30/87  soft-tissue]
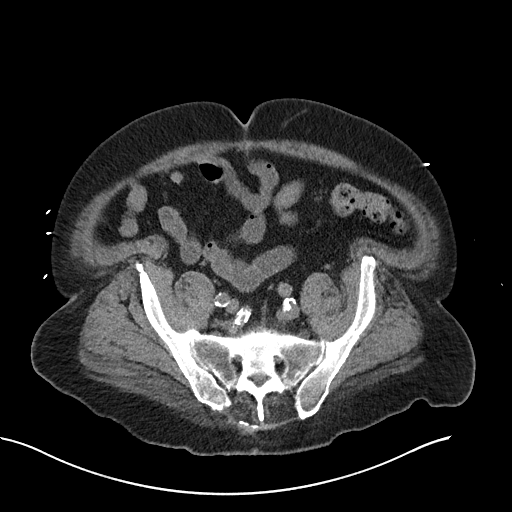
[im 38/87  soft-tissue]
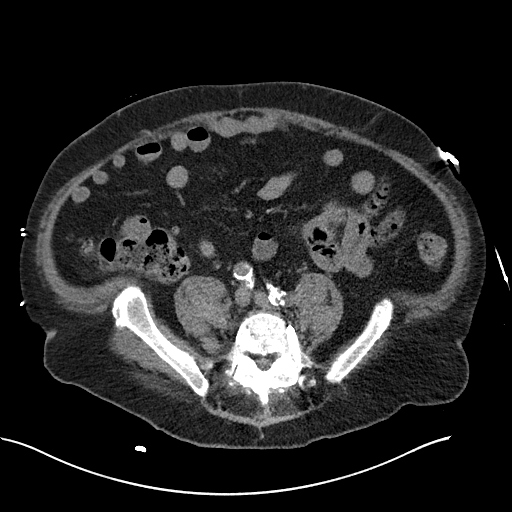
[im 45/87  soft-tissue]
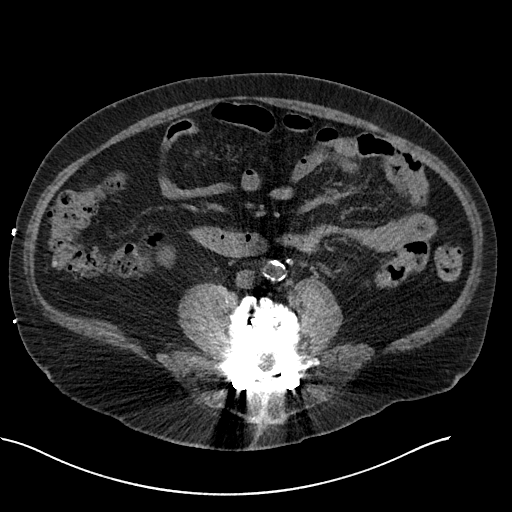
[im 49/87  soft-tissue]
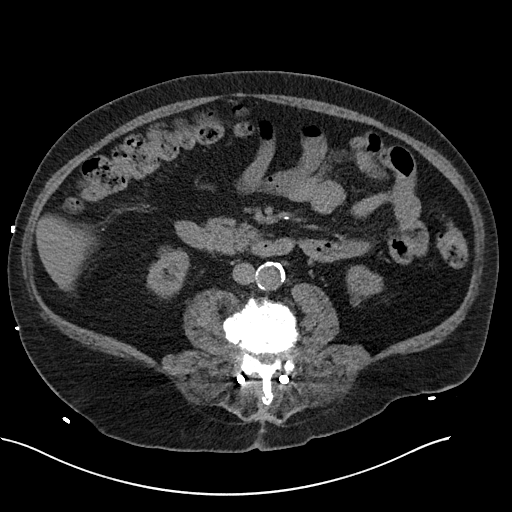
[im 57/87  soft-tissue]
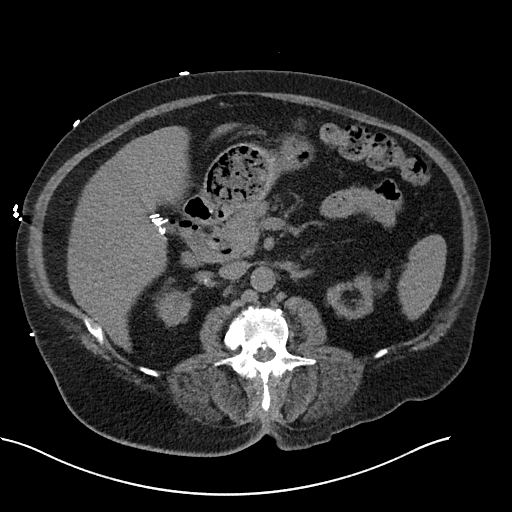
[im 57/87  bone]
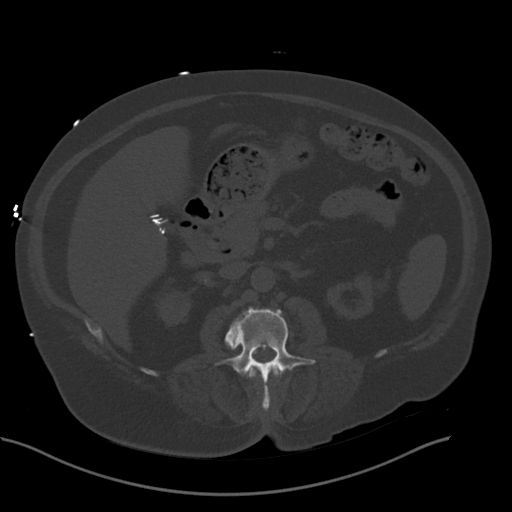
[im 64/87  soft-tissue]
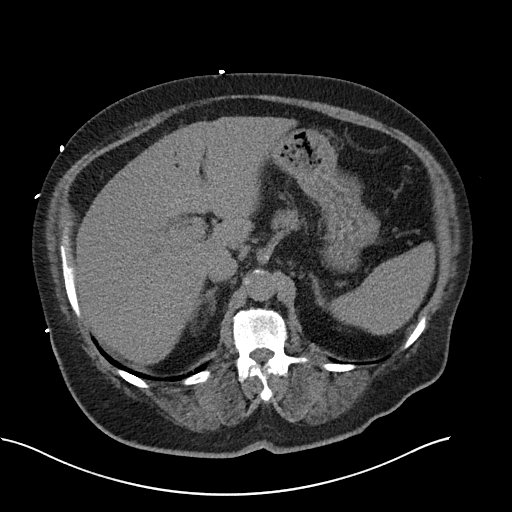
[im 68/87  soft-tissue]
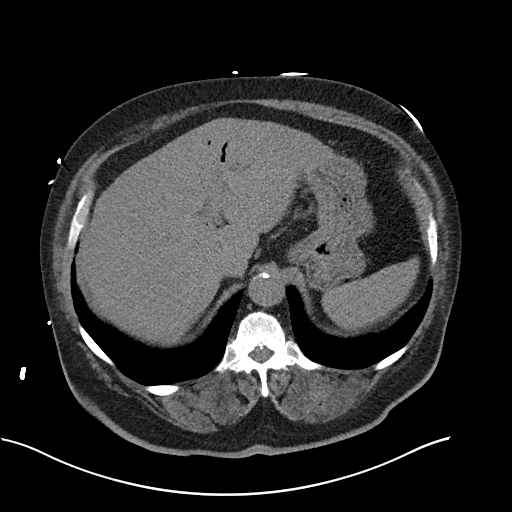
[im 75/87  soft-tissue]
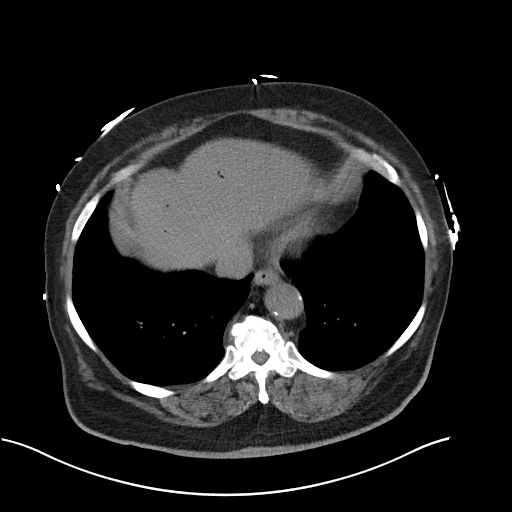
[im 83/87  soft-tissue]
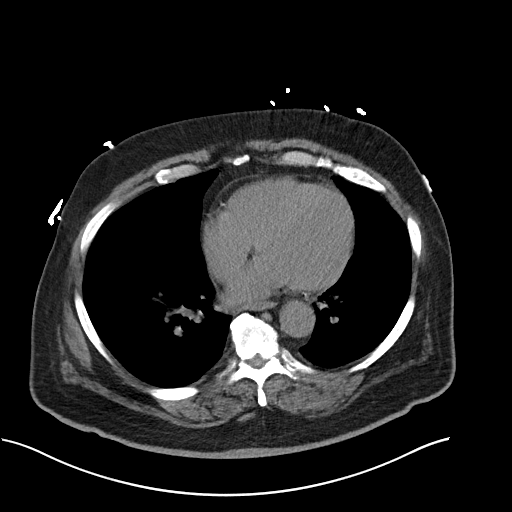

[Series 6: cor st · coronal · 0.85mm/px · 3 of 112 slices shown]
[im 38/112  soft-tissue]
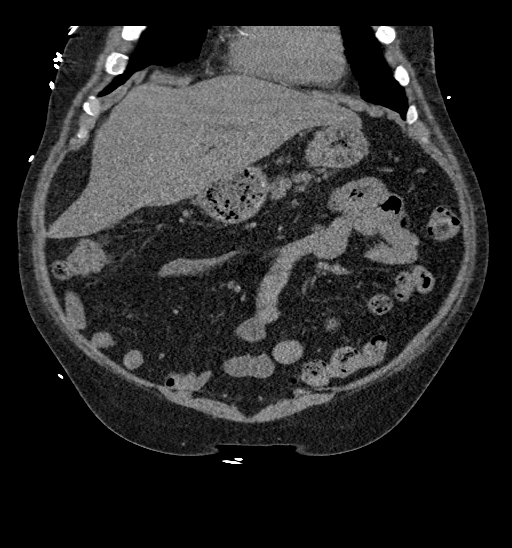
[im 50/112  soft-tissue]
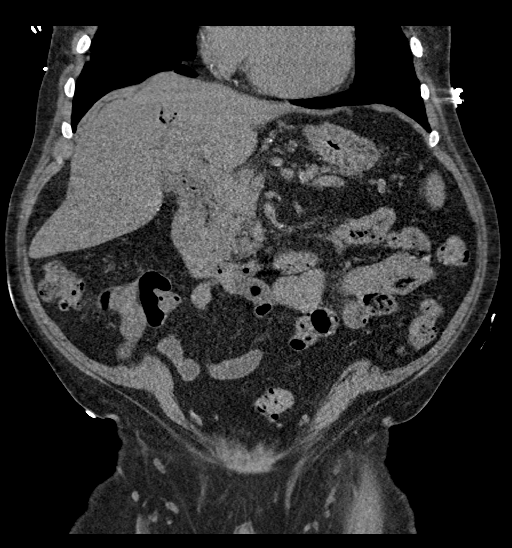
[im 62/112  soft-tissue]
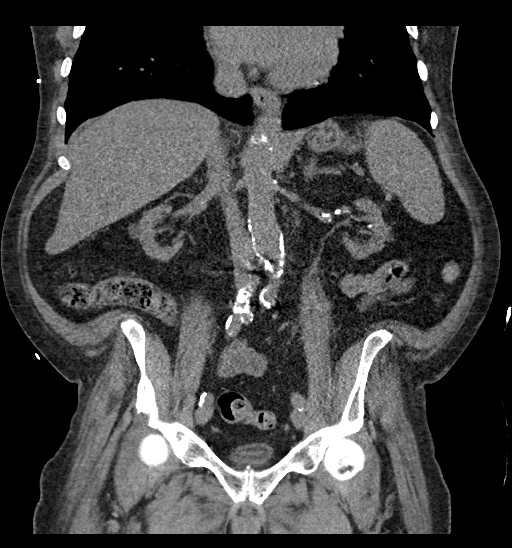

[16 of 46 positions shown; findings below may reference images not displayed]

FINDINGS: Lower chest: Cardiomegaly with coronary arteriosclerosis and aortic
atherosclerosis. No active pulmonary disease. Mild bronchiectasis in
the right lower lobe.

Hepatobiliary: Pneumobilia is redemonstrated presumably from prior
intervention. Cholecystectomy clips are present. The unenhanced
liver is unremarkable given limitations of a noncontrast study. No
definite mass identified.

Pancreas: Unremarkable. No pancreatic ductal dilatation or
surrounding inflammatory changes.

Spleen: Normal in size without focal abnormality.

Adrenals/Urinary Tract: Atrophic bilateral kidneys with stable
cysts. Normal bilateral adrenal glands. Renovascular calcifications
are identified. A few nonobstructing bilateral renal calculi also
present. No hydroureteronephrosis. Decompressed urinary bladder.

Stomach/Bowel: The stomach is decompressed in appearance. The
duodenal sweep and ligament of Treitz are normal. No small bowel
obstruction or inflammation. Increased fecal retention within the
colon with scattered colonic diverticulosis without acute
diverticulitis. Normal-appearing appendix.

Vascular/Lymphatic: Moderate aortoiliac branch vessel
atherosclerosis with mild ectasia of the common iliac arteries up to
19 mm in diameter on right and 13 mm on left. Nonaneurysmal thoracic
aorta. No lymphadenopathy.

Reproductive: Normal size prostate. Surgical clips are noted
projecting posterior to prostate gland.

Other: No abscess, free air nor abdominopelvic ascites.

Musculoskeletal: Degenerative disc disease L1-2 and L2-3 with
evidence of prior posterior lumbar interbody fusion at L4-5.
Interbody blocks noted at L3-4.
IMPRESSION: 1. Cardiomegaly with coronary arteriosclerosis.
2. Aortoiliac atherosclerosis with ectasia of the common iliac
arteries. No abdominal aortic aneurysm.
3. Atrophic kidneys with bilateral stable appearing renal cysts and
nonobstructing bilateral renal calculi.
4. Status post cholecystectomy with biliary redemonstrated likely
from prior intervention.
5. Thoracolumbar spondylosis with evidence of prior posterior lumbar
fusion at L4-5 with well incorporated interbody block and interbody
disc placed at L3-4.

## 2019-05-07 IMAGING — DX DG KNEE 1-2V PORT*L*
1 series · 2 of 2 positions shown · non-contrast
Comparison: None.

CLINICAL DATA: Knee joint effusion

EXAM:
PORTABLE LEFT KNEE - 1-2 VIEW

[Series 1: knee · 0.14mm/px · 2 of 2 slices shown]
[im 1/2]
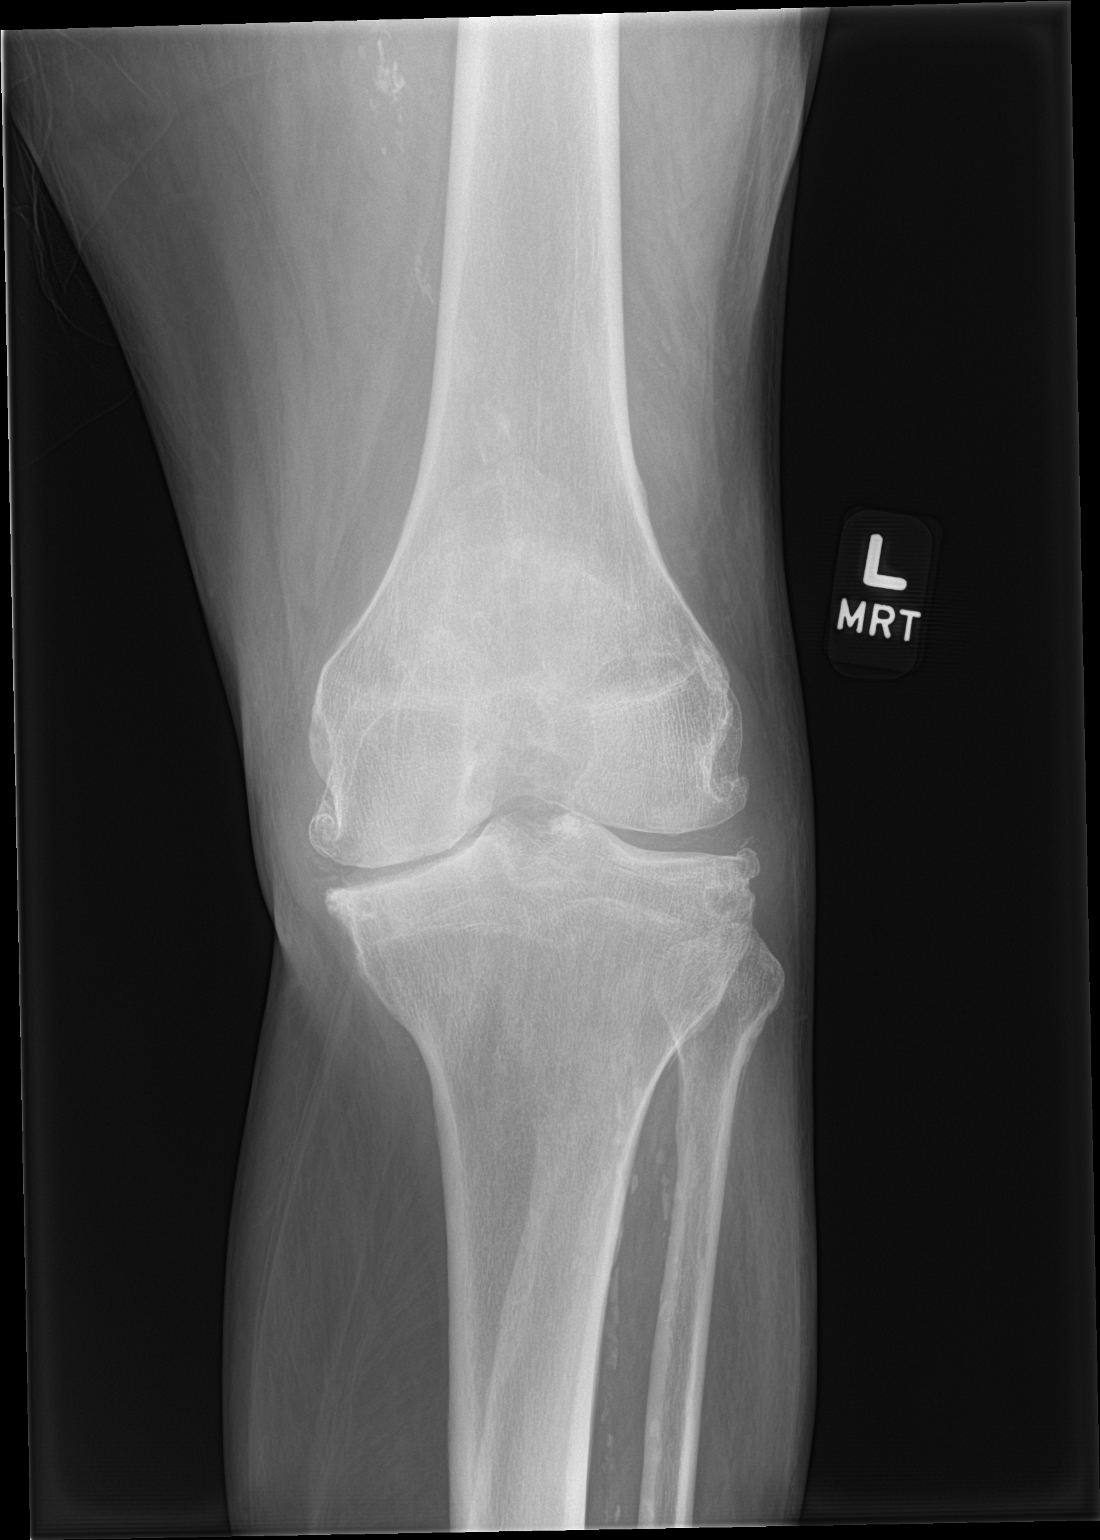
[im 2/2]
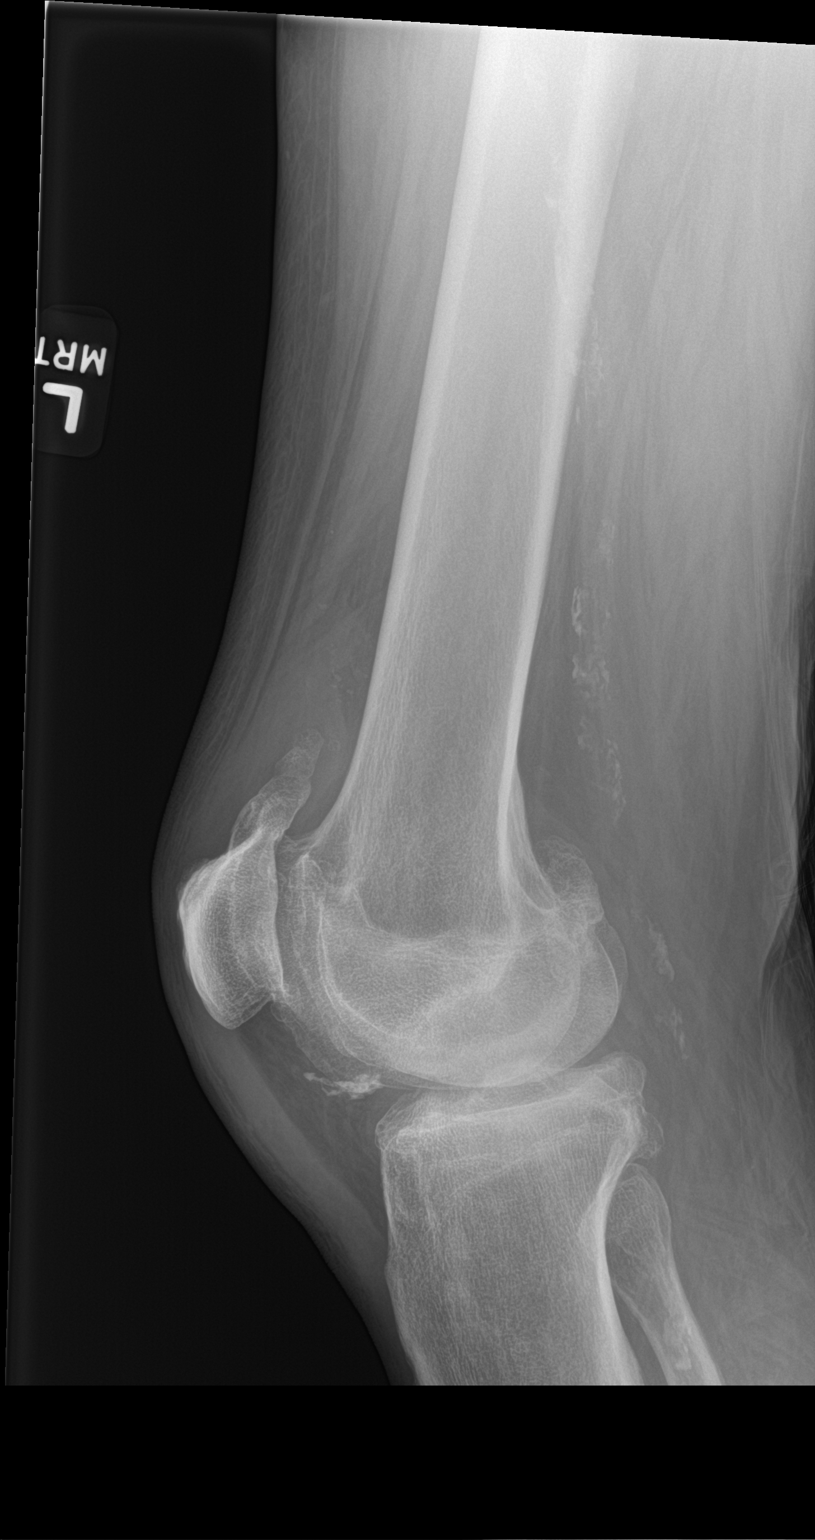

[2 of 2 positions shown; findings below may reference images not displayed]

FINDINGS: No acute fracture. No dislocation. Severe tricompartment
osteoarthritic change. Atherosclerotic calcification. Spurring of
the superior patella. Small joint effusion. Calcific densities
project over the anterior knee joint which may represent loose
bodies.
IMPRESSION: No acute bony pathology.

Small joint effusion.

Chronic findings described above.

## 2019-05-07 IMAGING — CR DG CHEST 2V
2 series · 2 of 2 positions shown · non-contrast
Comparison: Chest radiographs 06/10/2018 and earlier.

CLINICAL DATA: 67-year-old male with fever since last night. Right
flank pain.

EXAM:
CHEST - 2 VIEW

[chest lat]
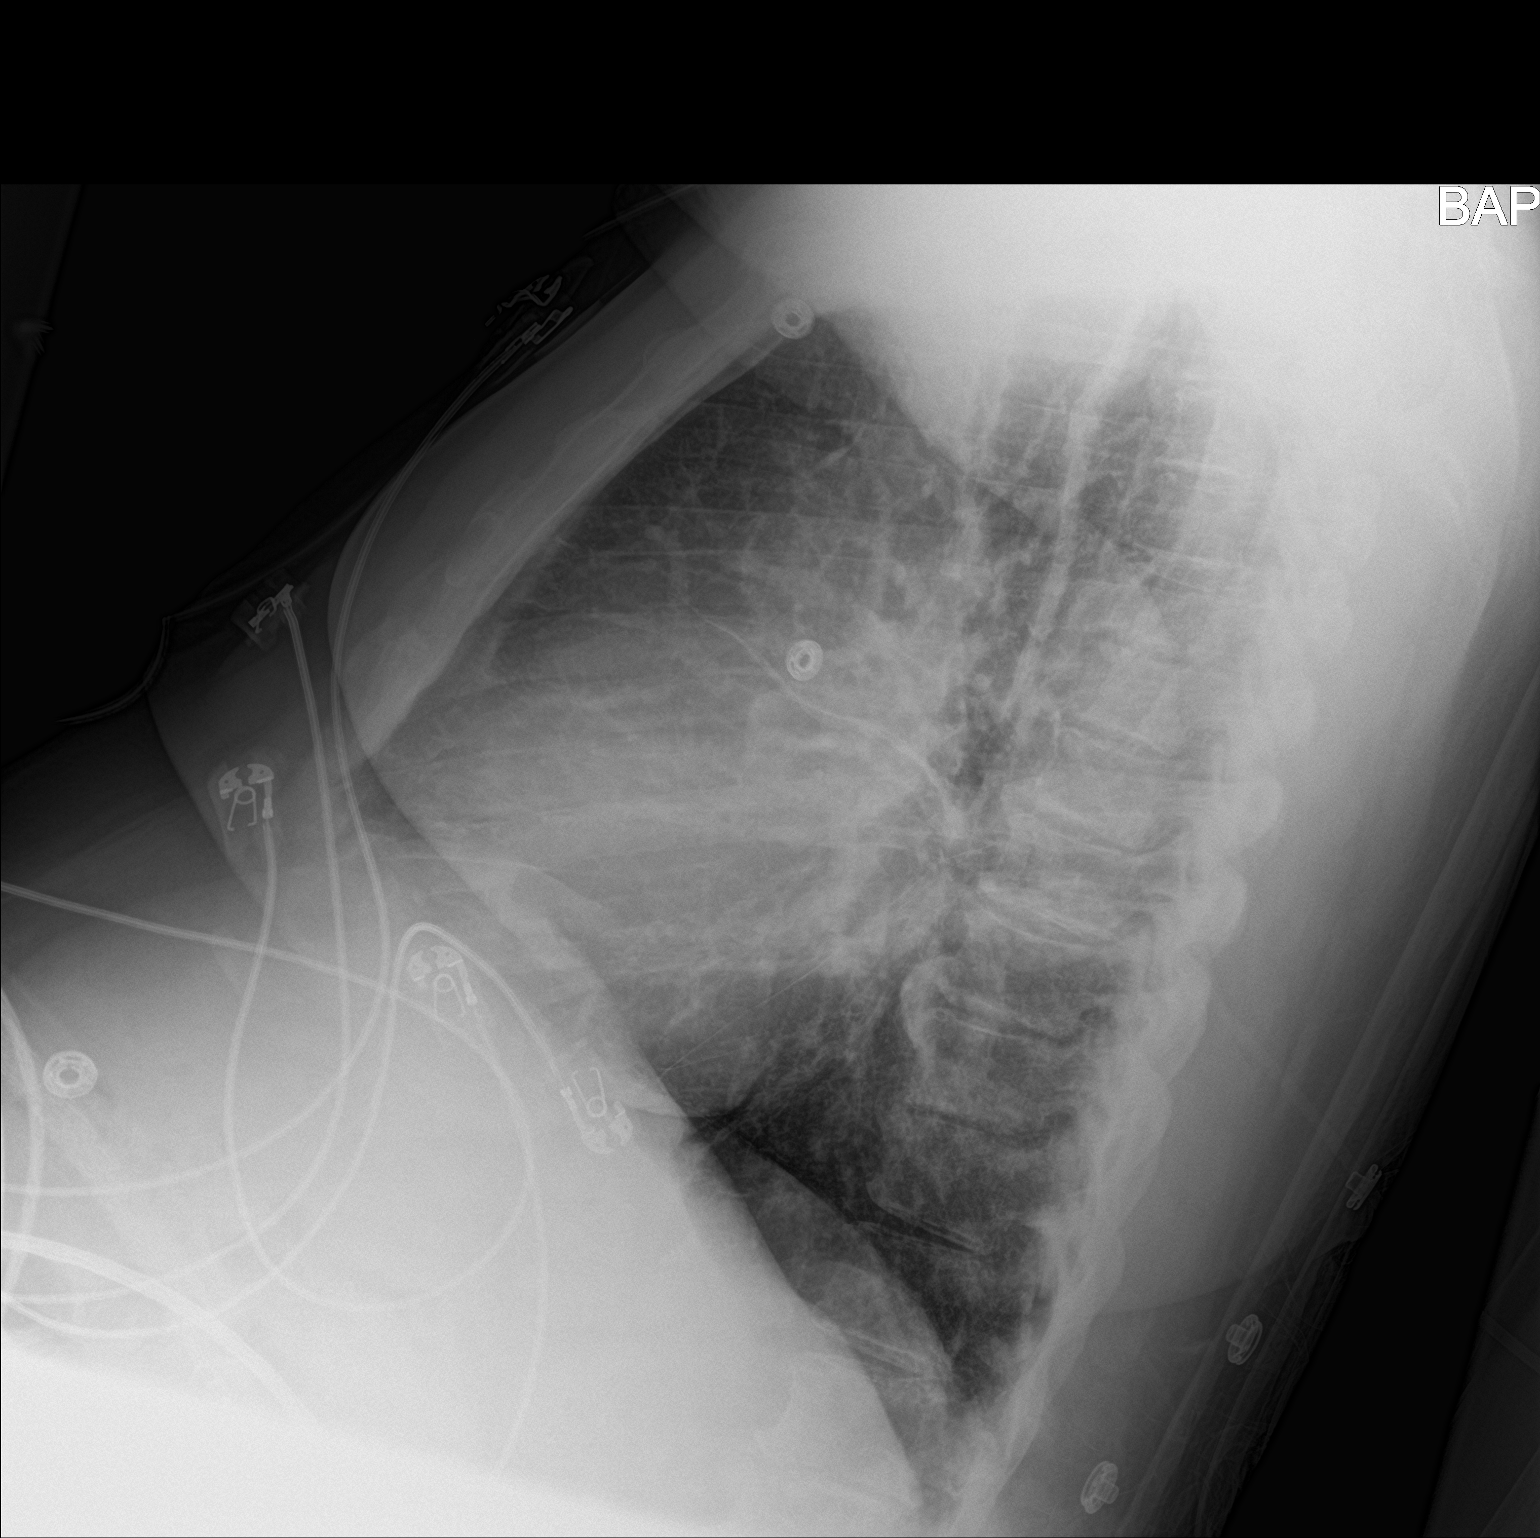

[chest ap]
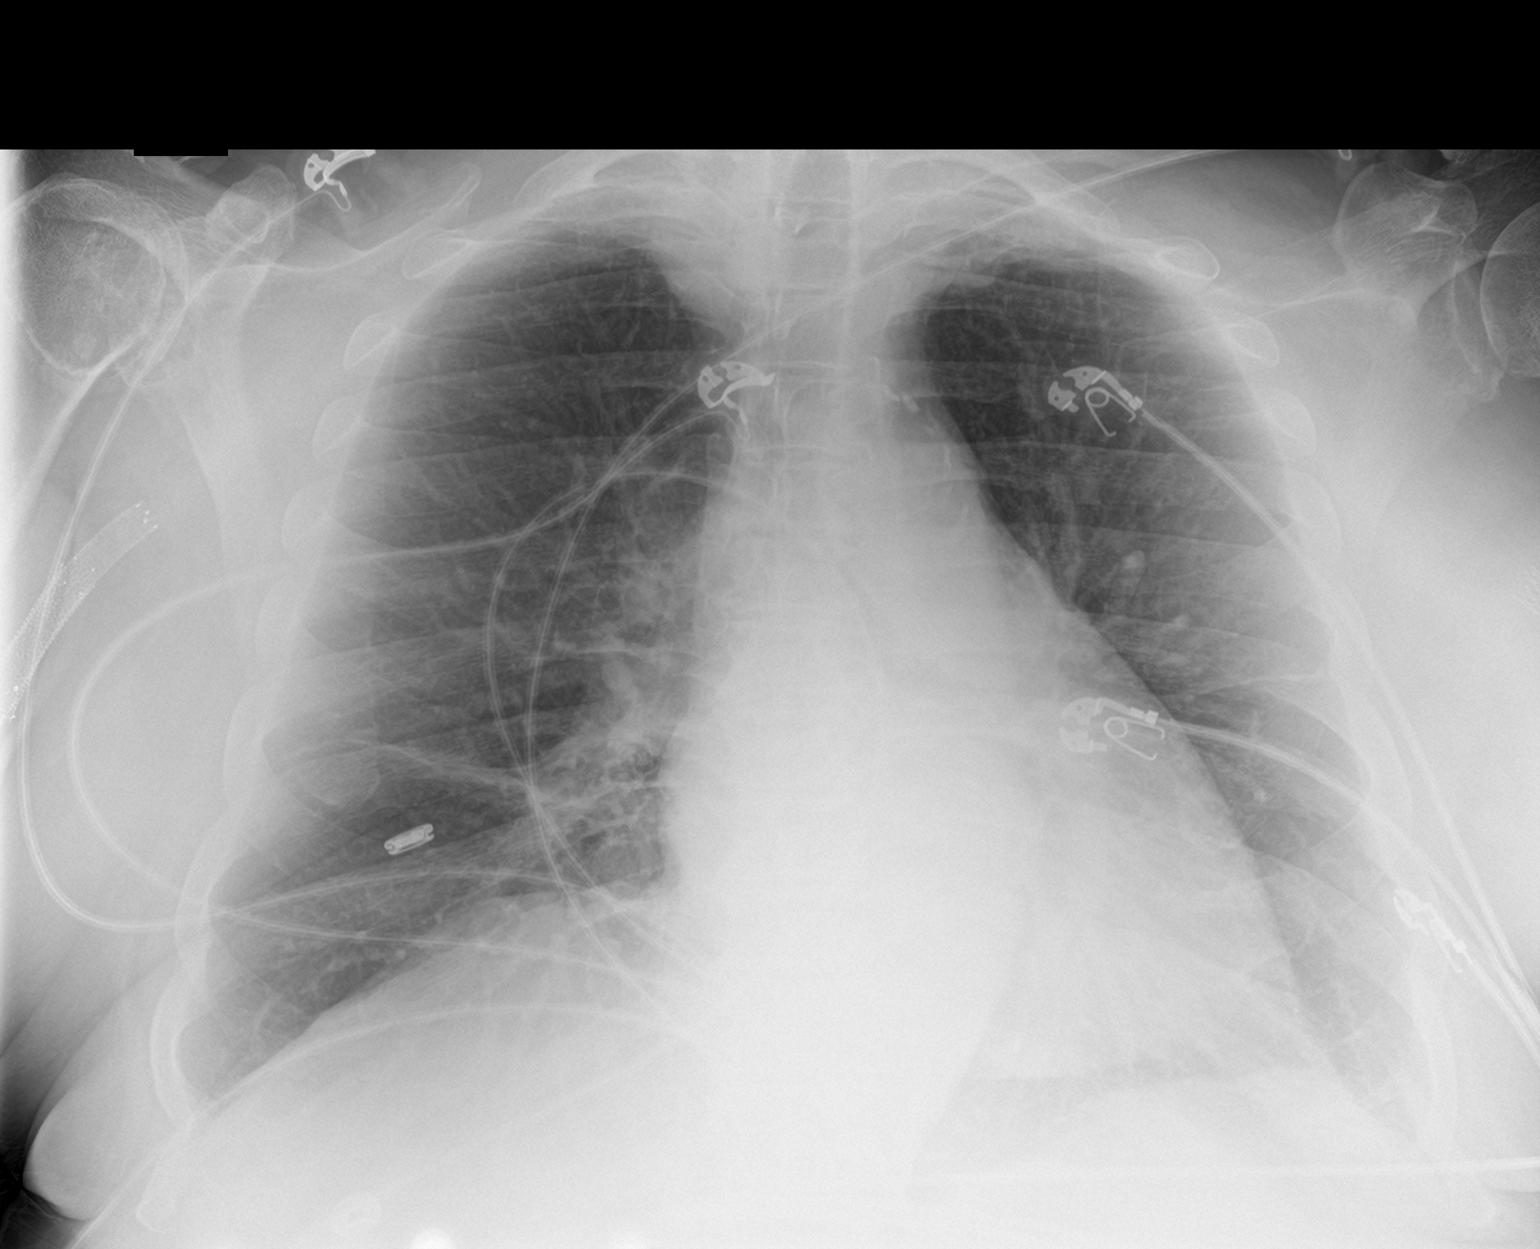

[2 of 2 positions shown; findings below may reference images not displayed]

FINDINGS: Stable lung volumes but resolved patchy medial right lung base
opacity since [REDACTED]. Stable cardiac size and mediastinal
contours. Calcified aortic atherosclerosis. Visualized tracheal air
column is within normal limits. No pneumothorax, pulmonary edema,
pleural effusion or confluent pulmonary opacity today. Partially
visible cervical ACDF. Right axillary vascular stent redemonstrated.
No acute osseous abnormality identified.
IMPRESSION: 1. No acute cardiopulmonary abnormality. Resolved right lung base
opacity since [DATE].  Aortic Atherosclerosis ([id number]).

## 2019-05-07 IMAGING — CT CT HEAD W/O CM
4 series · 17 of 47 positions shown, 19 images · non-contrast
Comparison: 06/11/2018

CLINICAL DATA: Weakness since this morning

EXAM:
CT HEAD WITHOUT CONTRAST
TECHNIQUE: Contiguous axial images were obtained from the base of the skull
through the vertex without intravenous contrast.

[Series 3: head without · axial · non-contrast · 0.46mm/px · z∈[-119,+1]mm · 7 of 34 slices shown, 9 images]
[im 5/34  brain]
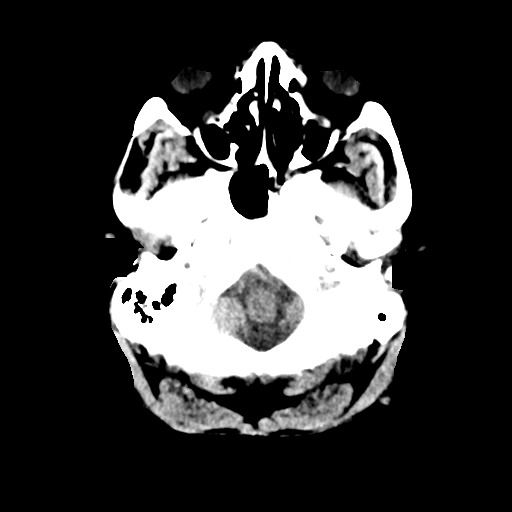
[im 5/34  bone]
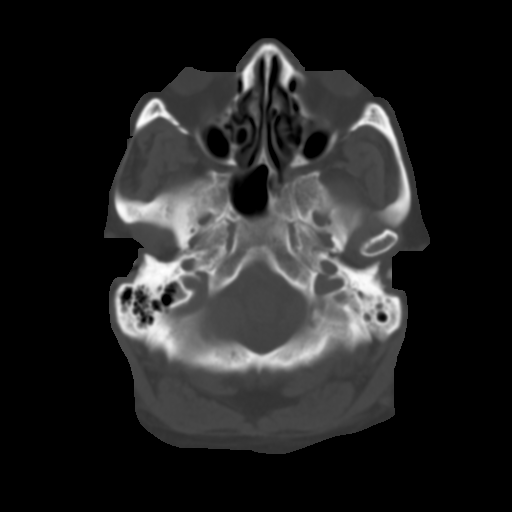
[im 9/34  brain]
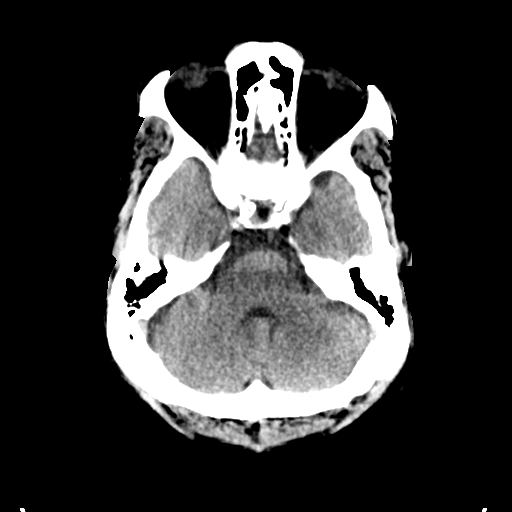
[im 13/34  brain]
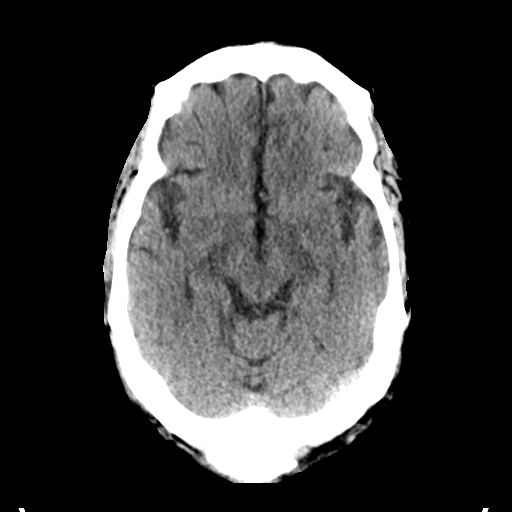
[im 17/34  brain]
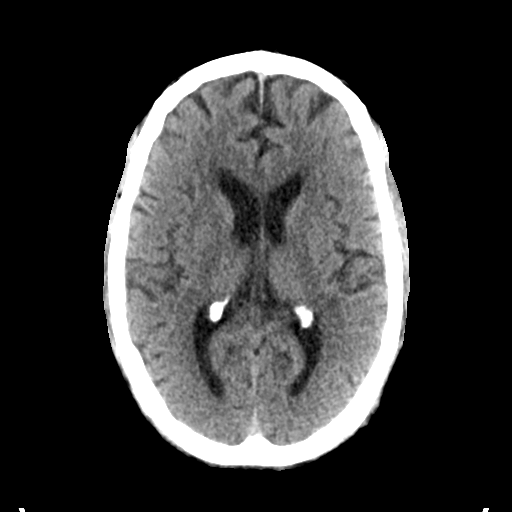
[im 21/34  brain]
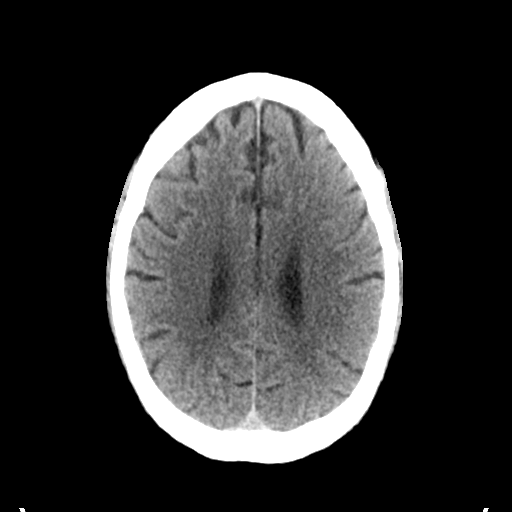
[im 21/34  bone]
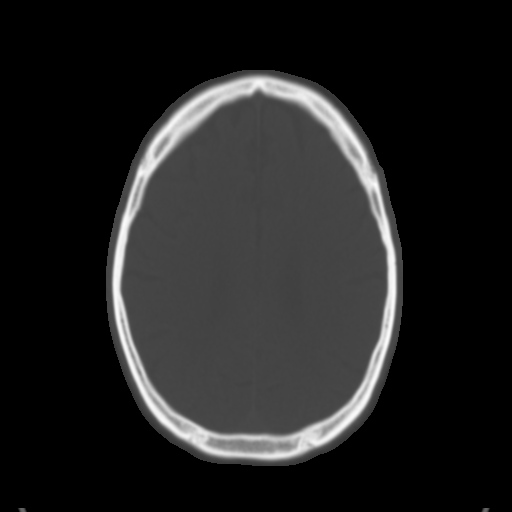
[im 25/34  brain]
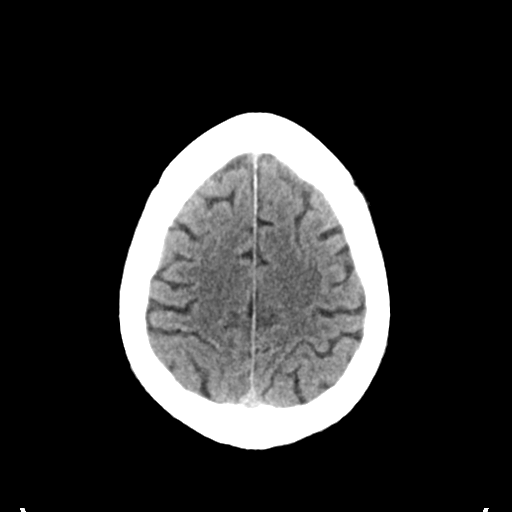
[im 29/34  brain]
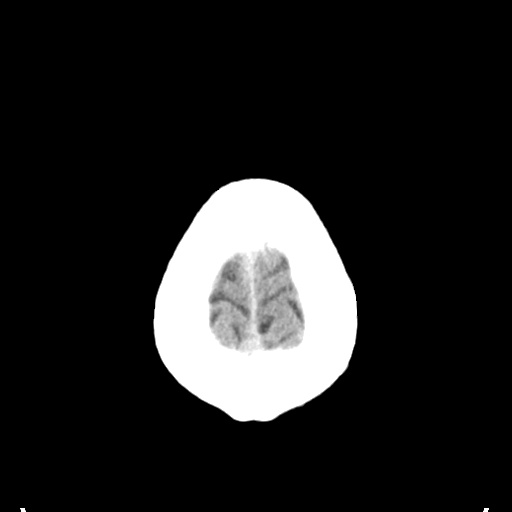

[Series 4: head bone · axial · 0.46mm/px · z∈[-123,-65]mm · 4 of 84 slices shown]
[im 9/84  bone]
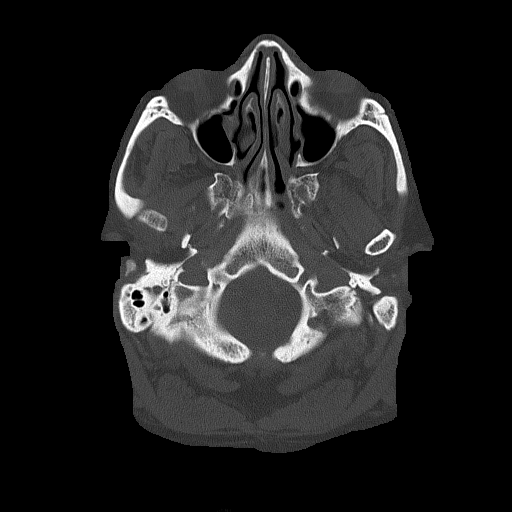
[im 17/84  bone]
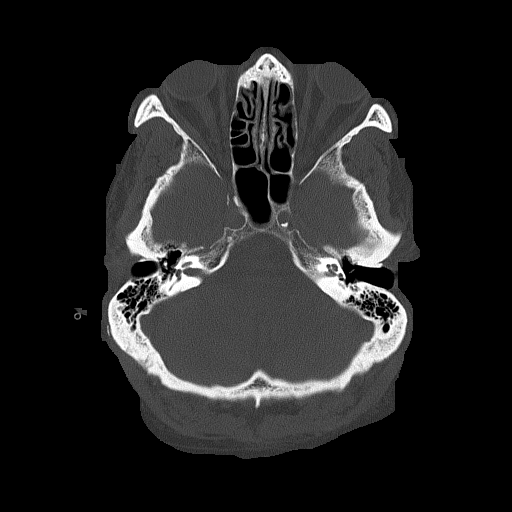
[im 25/84  bone]
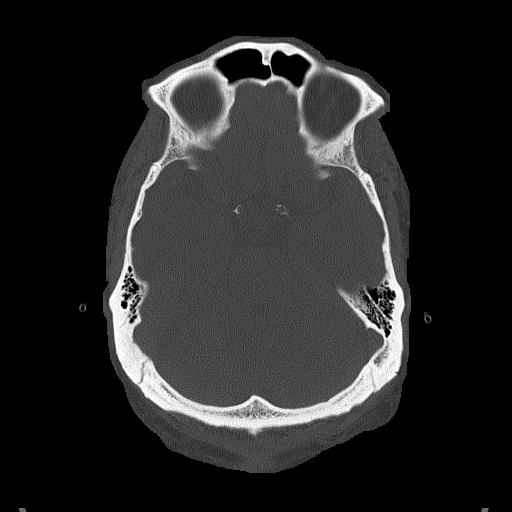
[im 38/84  bone]
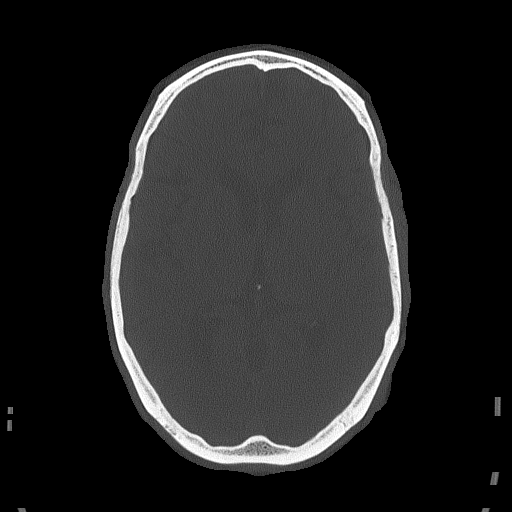

[Series 5: head without cor · coronal · non-contrast · 0.32mm/px · 3 of 72 slices shown]
[im 24/72  brain]
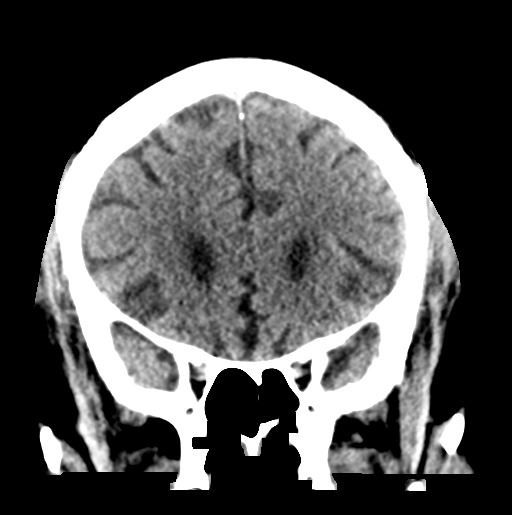
[im 32/72  brain]
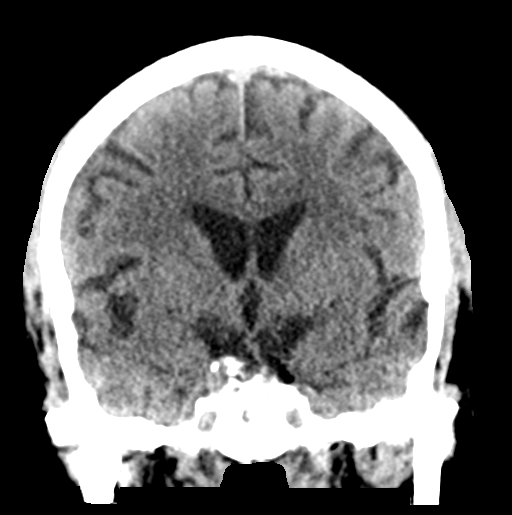
[im 40/72  brain]
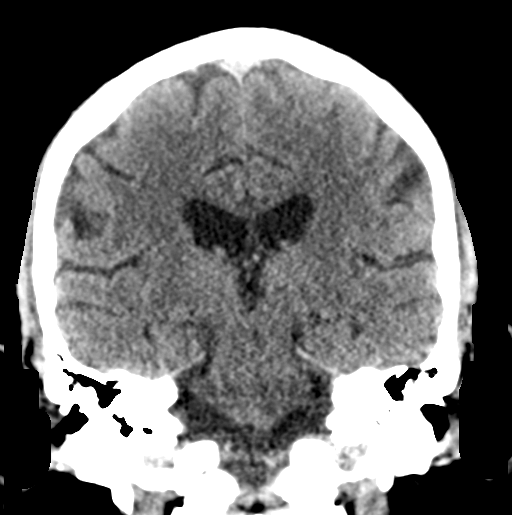

[Series 6: head without sag · sagittal · non-contrast · 0.32mm/px · 3 of 55 slices shown]
[im 19/55  brain]
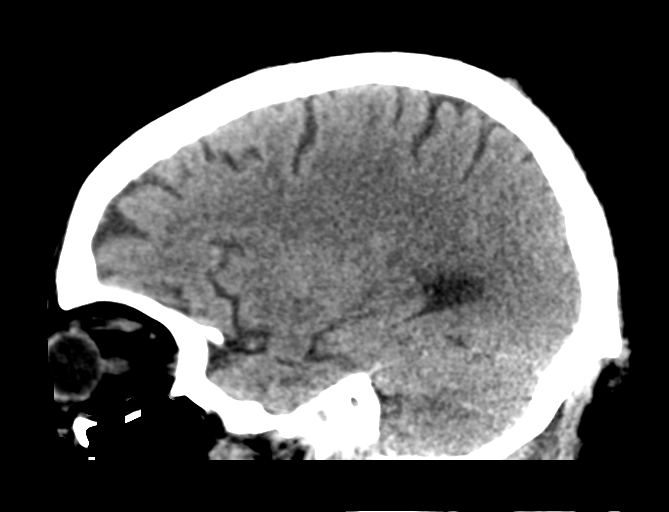
[im 28/55  brain]
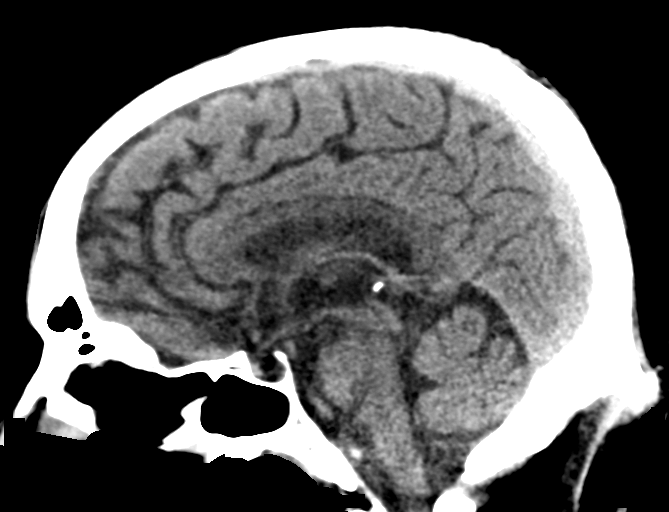
[im 37/55  brain]
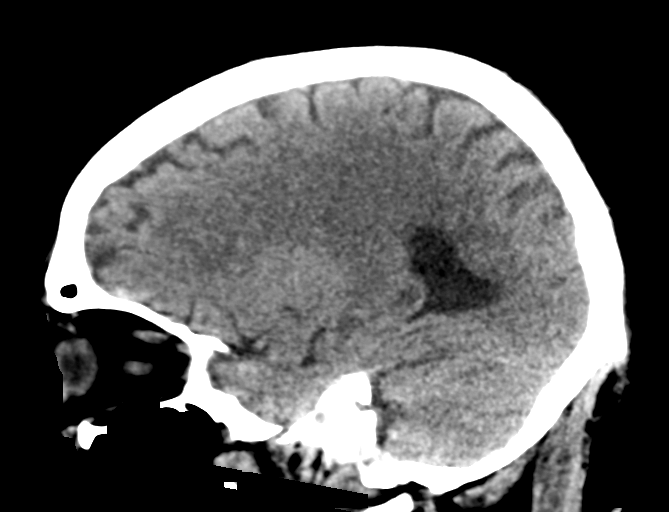

[17 of 47 positions shown; findings below may reference images not displayed]

FINDINGS: Brain: Mild chronic ischemic changes in the periventricular white
matter. Mild global atrophy. No mass effect, midline shift, or acute
intracranial hemorrhage.

Vascular: No hyperdense vessel or unexpected calcification.

Skull: Cranium is intact.

Sinuses/Orbits: Mastoid air cells are clear. Minimal mucosal
thickening in the sphenoid, frontal, and ethmoid sinus. Orbits are
within normal limits.

Other: Noncontributory.
IMPRESSION: No acute intracranial pathology.

## 2019-05-09 DIAGNOSIS — N2581 Secondary hyperparathyroidism of renal origin: Secondary | ICD-10-CM | POA: Diagnosis not present

## 2019-05-09 DIAGNOSIS — N186 End stage renal disease: Secondary | ICD-10-CM | POA: Diagnosis not present

## 2019-05-09 DIAGNOSIS — Z992 Dependence on renal dialysis: Secondary | ICD-10-CM | POA: Diagnosis not present

## 2019-05-09 DIAGNOSIS — D631 Anemia in chronic kidney disease: Secondary | ICD-10-CM | POA: Diagnosis not present

## 2019-05-09 DIAGNOSIS — D509 Iron deficiency anemia, unspecified: Secondary | ICD-10-CM | POA: Diagnosis not present

## 2019-05-09 DIAGNOSIS — E119 Type 2 diabetes mellitus without complications: Secondary | ICD-10-CM | POA: Diagnosis not present

## 2019-05-11 DIAGNOSIS — I132 Hypertensive heart and chronic kidney disease with heart failure and with stage 5 chronic kidney disease, or end stage renal disease: Secondary | ICD-10-CM | POA: Diagnosis not present

## 2019-05-11 DIAGNOSIS — I5082 Biventricular heart failure: Secondary | ICD-10-CM | POA: Diagnosis not present

## 2019-05-11 DIAGNOSIS — I5042 Chronic combined systolic (congestive) and diastolic (congestive) heart failure: Secondary | ICD-10-CM | POA: Diagnosis not present

## 2019-05-11 DIAGNOSIS — N186 End stage renal disease: Secondary | ICD-10-CM | POA: Diagnosis not present

## 2019-05-11 DIAGNOSIS — E1122 Type 2 diabetes mellitus with diabetic chronic kidney disease: Secondary | ICD-10-CM | POA: Diagnosis not present

## 2019-05-11 DIAGNOSIS — Z48815 Encounter for surgical aftercare following surgery on the digestive system: Secondary | ICD-10-CM | POA: Diagnosis not present

## 2019-05-12 DIAGNOSIS — N2581 Secondary hyperparathyroidism of renal origin: Secondary | ICD-10-CM | POA: Diagnosis not present

## 2019-05-12 DIAGNOSIS — N186 End stage renal disease: Secondary | ICD-10-CM | POA: Diagnosis not present

## 2019-05-12 DIAGNOSIS — D631 Anemia in chronic kidney disease: Secondary | ICD-10-CM | POA: Diagnosis not present

## 2019-05-12 DIAGNOSIS — E119 Type 2 diabetes mellitus without complications: Secondary | ICD-10-CM | POA: Diagnosis not present

## 2019-05-12 DIAGNOSIS — D509 Iron deficiency anemia, unspecified: Secondary | ICD-10-CM | POA: Diagnosis not present

## 2019-05-12 DIAGNOSIS — Z992 Dependence on renal dialysis: Secondary | ICD-10-CM | POA: Diagnosis not present

## 2019-05-13 DIAGNOSIS — E1122 Type 2 diabetes mellitus with diabetic chronic kidney disease: Secondary | ICD-10-CM | POA: Diagnosis not present

## 2019-05-13 DIAGNOSIS — I5082 Biventricular heart failure: Secondary | ICD-10-CM | POA: Diagnosis not present

## 2019-05-13 DIAGNOSIS — Z48815 Encounter for surgical aftercare following surgery on the digestive system: Secondary | ICD-10-CM | POA: Diagnosis not present

## 2019-05-13 DIAGNOSIS — I5042 Chronic combined systolic (congestive) and diastolic (congestive) heart failure: Secondary | ICD-10-CM | POA: Diagnosis not present

## 2019-05-13 DIAGNOSIS — N186 End stage renal disease: Secondary | ICD-10-CM | POA: Diagnosis not present

## 2019-05-13 DIAGNOSIS — I132 Hypertensive heart and chronic kidney disease with heart failure and with stage 5 chronic kidney disease, or end stage renal disease: Secondary | ICD-10-CM | POA: Diagnosis not present

## 2019-05-14 DIAGNOSIS — I132 Hypertensive heart and chronic kidney disease with heart failure and with stage 5 chronic kidney disease, or end stage renal disease: Secondary | ICD-10-CM | POA: Diagnosis not present

## 2019-05-14 DIAGNOSIS — Z8546 Personal history of malignant neoplasm of prostate: Secondary | ICD-10-CM | POA: Diagnosis not present

## 2019-05-14 DIAGNOSIS — I255 Ischemic cardiomyopathy: Secondary | ICD-10-CM | POA: Diagnosis not present

## 2019-05-14 DIAGNOSIS — M109 Gout, unspecified: Secondary | ICD-10-CM | POA: Diagnosis not present

## 2019-05-14 DIAGNOSIS — E119 Type 2 diabetes mellitus without complications: Secondary | ICD-10-CM | POA: Diagnosis not present

## 2019-05-14 DIAGNOSIS — N186 End stage renal disease: Secondary | ICD-10-CM | POA: Diagnosis not present

## 2019-05-14 DIAGNOSIS — Z452 Encounter for adjustment and management of vascular access device: Secondary | ICD-10-CM | POA: Diagnosis not present

## 2019-05-14 DIAGNOSIS — E039 Hypothyroidism, unspecified: Secondary | ICD-10-CM | POA: Diagnosis not present

## 2019-05-14 DIAGNOSIS — I5042 Chronic combined systolic (congestive) and diastolic (congestive) heart failure: Secondary | ICD-10-CM | POA: Diagnosis not present

## 2019-05-14 DIAGNOSIS — D509 Iron deficiency anemia, unspecified: Secondary | ICD-10-CM | POA: Diagnosis not present

## 2019-05-14 DIAGNOSIS — J449 Chronic obstructive pulmonary disease, unspecified: Secondary | ICD-10-CM | POA: Diagnosis not present

## 2019-05-14 DIAGNOSIS — Z86010 Personal history of colonic polyps: Secondary | ICD-10-CM | POA: Diagnosis not present

## 2019-05-14 DIAGNOSIS — I252 Old myocardial infarction: Secondary | ICD-10-CM | POA: Diagnosis not present

## 2019-05-14 DIAGNOSIS — Z9861 Coronary angioplasty status: Secondary | ICD-10-CM | POA: Diagnosis not present

## 2019-05-14 DIAGNOSIS — E1122 Type 2 diabetes mellitus with diabetic chronic kidney disease: Secondary | ICD-10-CM | POA: Diagnosis not present

## 2019-05-14 DIAGNOSIS — Z87891 Personal history of nicotine dependence: Secondary | ICD-10-CM | POA: Diagnosis not present

## 2019-05-14 DIAGNOSIS — E1151 Type 2 diabetes mellitus with diabetic peripheral angiopathy without gangrene: Secondary | ICD-10-CM | POA: Diagnosis not present

## 2019-05-14 DIAGNOSIS — Z992 Dependence on renal dialysis: Secondary | ICD-10-CM | POA: Diagnosis not present

## 2019-05-14 DIAGNOSIS — M5412 Radiculopathy, cervical region: Secondary | ICD-10-CM | POA: Diagnosis not present

## 2019-05-14 DIAGNOSIS — F0280 Dementia in other diseases classified elsewhere without behavioral disturbance: Secondary | ICD-10-CM | POA: Diagnosis not present

## 2019-05-14 DIAGNOSIS — Z48815 Encounter for surgical aftercare following surgery on the digestive system: Secondary | ICD-10-CM | POA: Diagnosis not present

## 2019-05-14 DIAGNOSIS — N2581 Secondary hyperparathyroidism of renal origin: Secondary | ICD-10-CM | POA: Diagnosis not present

## 2019-05-14 DIAGNOSIS — I5082 Biventricular heart failure: Secondary | ICD-10-CM | POA: Diagnosis not present

## 2019-05-14 DIAGNOSIS — M48062 Spinal stenosis, lumbar region with neurogenic claudication: Secondary | ICD-10-CM | POA: Diagnosis not present

## 2019-05-14 DIAGNOSIS — E1142 Type 2 diabetes mellitus with diabetic polyneuropathy: Secondary | ICD-10-CM | POA: Diagnosis not present

## 2019-05-14 DIAGNOSIS — F329 Major depressive disorder, single episode, unspecified: Secondary | ICD-10-CM | POA: Diagnosis not present

## 2019-05-14 DIAGNOSIS — D631 Anemia in chronic kidney disease: Secondary | ICD-10-CM | POA: Diagnosis not present

## 2019-05-14 DIAGNOSIS — I2510 Atherosclerotic heart disease of native coronary artery without angina pectoris: Secondary | ICD-10-CM | POA: Diagnosis not present

## 2019-05-14 DIAGNOSIS — Z96651 Presence of right artificial knee joint: Secondary | ICD-10-CM | POA: Diagnosis not present

## 2019-05-16 ENCOUNTER — Other Ambulatory Visit (HOSPITAL_COMMUNITY): Payer: Medicare Other | Attending: Gastroenterology

## 2019-05-16 DIAGNOSIS — E119 Type 2 diabetes mellitus without complications: Secondary | ICD-10-CM | POA: Diagnosis not present

## 2019-05-16 DIAGNOSIS — N2581 Secondary hyperparathyroidism of renal origin: Secondary | ICD-10-CM | POA: Diagnosis not present

## 2019-05-16 DIAGNOSIS — N186 End stage renal disease: Secondary | ICD-10-CM | POA: Diagnosis not present

## 2019-05-16 DIAGNOSIS — Z992 Dependence on renal dialysis: Secondary | ICD-10-CM | POA: Diagnosis not present

## 2019-05-16 DIAGNOSIS — D509 Iron deficiency anemia, unspecified: Secondary | ICD-10-CM | POA: Diagnosis not present

## 2019-05-16 DIAGNOSIS — D631 Anemia in chronic kidney disease: Secondary | ICD-10-CM | POA: Diagnosis not present

## 2019-05-18 ENCOUNTER — Other Ambulatory Visit (HOSPITAL_COMMUNITY)
Admission: RE | Admit: 2019-05-18 | Discharge: 2019-05-18 | Disposition: A | Discharge status: Home / Self Care | Payer: Medicare Other | Source: Ambulatory Visit | Attending: Gastroenterology | Admitting: Gastroenterology

## 2019-05-18 DIAGNOSIS — Z01812 Encounter for preprocedural laboratory examination: Secondary | ICD-10-CM | POA: Insufficient documentation

## 2019-05-18 DIAGNOSIS — Z20828 Contact with and (suspected) exposure to other viral communicable diseases: Secondary | ICD-10-CM | POA: Diagnosis not present

## 2019-05-18 LAB — SARS CORONAVIRUS 2 (TAT 6-24 HRS): SARS Coronavirus 2: NEGATIVE

## 2019-05-19 ENCOUNTER — Encounter (HOSPITAL_COMMUNITY): Payer: Self-pay | Admitting: *Deleted

## 2019-05-19 ENCOUNTER — Other Ambulatory Visit: Payer: Self-pay

## 2019-05-19 DIAGNOSIS — N2581 Secondary hyperparathyroidism of renal origin: Secondary | ICD-10-CM | POA: Diagnosis not present

## 2019-05-19 DIAGNOSIS — Z992 Dependence on renal dialysis: Secondary | ICD-10-CM | POA: Diagnosis not present

## 2019-05-19 DIAGNOSIS — N186 End stage renal disease: Secondary | ICD-10-CM | POA: Diagnosis not present

## 2019-05-19 DIAGNOSIS — E119 Type 2 diabetes mellitus without complications: Secondary | ICD-10-CM | POA: Diagnosis not present

## 2019-05-19 DIAGNOSIS — D509 Iron deficiency anemia, unspecified: Secondary | ICD-10-CM | POA: Diagnosis not present

## 2019-05-19 DIAGNOSIS — D631 Anemia in chronic kidney disease: Secondary | ICD-10-CM | POA: Diagnosis not present

## 2019-05-19 NOTE — Progress Notes (Signed)
Spoke with wife, patient is at dialysis, states has quarantined and had no symptoms since Covid 19 test.

## 2019-05-19 NOTE — Progress Notes (Signed)
Attempted to contact patient. Left voice message.

## 2019-05-20 ENCOUNTER — Encounter (HOSPITAL_COMMUNITY)
Admission: RE | Disposition: A | Discharge status: Home / Self Care | Payer: Self-pay | Source: Ambulatory Visit | Attending: Gastroenterology

## 2019-05-20 ENCOUNTER — Ambulatory Visit (HOSPITAL_COMMUNITY): Payer: Medicare Other | Admitting: Anesthesiology

## 2019-05-20 ENCOUNTER — Encounter (HOSPITAL_COMMUNITY): Payer: Self-pay | Admitting: Anesthesiology

## 2019-05-20 ENCOUNTER — Other Ambulatory Visit: Payer: Self-pay

## 2019-05-20 ENCOUNTER — Ambulatory Visit (HOSPITAL_COMMUNITY)
Admission: RE | Admit: 2019-05-20 | Discharge: 2019-05-20 | Disposition: A | Discharge status: Home / Self Care | Payer: Medicare Other | Source: Ambulatory Visit | Attending: Gastroenterology | Admitting: Gastroenterology

## 2019-05-20 ENCOUNTER — Ambulatory Visit (HOSPITAL_COMMUNITY): Payer: Medicare Other

## 2019-05-20 DIAGNOSIS — E1122 Type 2 diabetes mellitus with diabetic chronic kidney disease: Secondary | ICD-10-CM | POA: Insufficient documentation

## 2019-05-20 DIAGNOSIS — E1151 Type 2 diabetes mellitus with diabetic peripheral angiopathy without gangrene: Secondary | ICD-10-CM | POA: Insufficient documentation

## 2019-05-20 DIAGNOSIS — Z79899 Other long term (current) drug therapy: Secondary | ICD-10-CM | POA: Diagnosis not present

## 2019-05-20 DIAGNOSIS — I2510 Atherosclerotic heart disease of native coronary artery without angina pectoris: Secondary | ICD-10-CM | POA: Diagnosis not present

## 2019-05-20 DIAGNOSIS — G4730 Sleep apnea, unspecified: Secondary | ICD-10-CM | POA: Diagnosis not present

## 2019-05-20 DIAGNOSIS — K8050 Calculus of bile duct without cholangitis or cholecystitis without obstruction: Secondary | ICD-10-CM | POA: Diagnosis not present

## 2019-05-20 DIAGNOSIS — Z87891 Personal history of nicotine dependence: Secondary | ICD-10-CM | POA: Insufficient documentation

## 2019-05-20 DIAGNOSIS — M109 Gout, unspecified: Secondary | ICD-10-CM | POA: Diagnosis not present

## 2019-05-20 DIAGNOSIS — I252 Old myocardial infarction: Secondary | ICD-10-CM | POA: Diagnosis not present

## 2019-05-20 DIAGNOSIS — N186 End stage renal disease: Secondary | ICD-10-CM | POA: Diagnosis not present

## 2019-05-20 DIAGNOSIS — Z992 Dependence on renal dialysis: Secondary | ICD-10-CM | POA: Diagnosis not present

## 2019-05-20 DIAGNOSIS — Z955 Presence of coronary angioplasty implant and graft: Secondary | ICD-10-CM | POA: Insufficient documentation

## 2019-05-20 DIAGNOSIS — K219 Gastro-esophageal reflux disease without esophagitis: Secondary | ICD-10-CM | POA: Diagnosis not present

## 2019-05-20 DIAGNOSIS — Z8673 Personal history of transient ischemic attack (TIA), and cerebral infarction without residual deficits: Secondary | ICD-10-CM | POA: Insufficient documentation

## 2019-05-20 DIAGNOSIS — Z881 Allergy status to other antibiotic agents status: Secondary | ICD-10-CM | POA: Diagnosis not present

## 2019-05-20 DIAGNOSIS — Z6832 Body mass index (BMI) 32.0-32.9, adult: Secondary | ICD-10-CM | POA: Insufficient documentation

## 2019-05-20 DIAGNOSIS — Z8546 Personal history of malignant neoplasm of prostate: Secondary | ICD-10-CM | POA: Insufficient documentation

## 2019-05-20 DIAGNOSIS — M1990 Unspecified osteoarthritis, unspecified site: Secondary | ICD-10-CM | POA: Diagnosis not present

## 2019-05-20 DIAGNOSIS — Z96651 Presence of right artificial knee joint: Secondary | ICD-10-CM | POA: Insufficient documentation

## 2019-05-20 DIAGNOSIS — I132 Hypertensive heart and chronic kidney disease with heart failure and with stage 5 chronic kidney disease, or end stage renal disease: Secondary | ICD-10-CM | POA: Insufficient documentation

## 2019-05-20 DIAGNOSIS — Z4659 Encounter for fitting and adjustment of other gastrointestinal appliance and device: Secondary | ICD-10-CM | POA: Diagnosis not present

## 2019-05-20 DIAGNOSIS — E669 Obesity, unspecified: Secondary | ICD-10-CM | POA: Diagnosis not present

## 2019-05-20 DIAGNOSIS — E1140 Type 2 diabetes mellitus with diabetic neuropathy, unspecified: Secondary | ICD-10-CM | POA: Insufficient documentation

## 2019-05-20 DIAGNOSIS — K831 Obstruction of bile duct: Secondary | ICD-10-CM | POA: Diagnosis not present

## 2019-05-20 DIAGNOSIS — K8020 Calculus of gallbladder without cholecystitis without obstruction: Secondary | ICD-10-CM | POA: Diagnosis not present

## 2019-05-20 DIAGNOSIS — I509 Heart failure, unspecified: Secondary | ICD-10-CM | POA: Insufficient documentation

## 2019-05-20 DIAGNOSIS — F329 Major depressive disorder, single episode, unspecified: Secondary | ICD-10-CM | POA: Diagnosis not present

## 2019-05-20 DIAGNOSIS — I5042 Chronic combined systolic (congestive) and diastolic (congestive) heart failure: Secondary | ICD-10-CM | POA: Diagnosis not present

## 2019-05-20 HISTORY — PX: SPYGLASS LITHOTRIPSY: SHX5537

## 2019-05-20 HISTORY — PX: ERCP: SHX5425

## 2019-05-20 HISTORY — PX: BILIARY STENT PLACEMENT: SHX5538

## 2019-05-20 HISTORY — PX: BILIARY DILATION: SHX6850

## 2019-05-20 HISTORY — PX: SPYGLASS CHOLANGIOSCOPY: SHX5441

## 2019-05-20 HISTORY — PX: STENT REMOVAL: SHX6421

## 2019-05-20 HISTORY — PX: SPHINCTEROTOMY: SHX5544

## 2019-05-20 LAB — POCT I-STAT EG7
Acid-Base Excess: 7 mmol/L — ABNORMAL HIGH (ref 0.0–2.0)
Bicarbonate: 32 mmol/L — ABNORMAL HIGH (ref 20.0–28.0)
Calcium, Ion: 0.98 mmol/L — ABNORMAL LOW (ref 1.15–1.40)
HCT: 35 % — ABNORMAL LOW (ref 39.0–52.0)
Hemoglobin: 11.9 g/dL — ABNORMAL LOW (ref 13.0–17.0)
O2 Saturation: 71 %
Potassium: 4.6 mmol/L (ref 3.5–5.1)
Sodium: 139 mmol/L (ref 135–145)
TCO2: 33 mmol/L — ABNORMAL HIGH (ref 22–32)
pCO2, Ven: 44.3 mmHg (ref 44.0–60.0)
pH, Ven: 7.467 — ABNORMAL HIGH (ref 7.250–7.430)
pO2, Ven: 35 mmHg (ref 32.0–45.0)

## 2019-05-20 LAB — GLUCOSE, CAPILLARY: Glucose-Capillary: 123 mg/dL — ABNORMAL HIGH (ref 70–99)

## 2019-05-20 SURGERY — ERCP, WITH INTERVENTION IF INDICATED
Anesthesia: General

## 2019-05-20 MED ORDER — PROPOFOL 10 MG/ML IV BOLUS
INTRAVENOUS | Status: DC | PRN
  Administered 2019-05-20: 20 mg via INTRAVENOUS
  Administered 2019-05-20: 30 mg via INTRAVENOUS
  Administered 2019-05-20: 120 mg via INTRAVENOUS

## 2019-05-20 MED ORDER — SODIUM CHLORIDE 0.9 % IV SOLN
INTRAVENOUS | Status: DC | PRN
  Administered 2019-05-20: 40 ug/min via INTRAVENOUS

## 2019-05-20 MED ORDER — ROCURONIUM BROMIDE 100 MG/10ML IV SOLN
INTRAVENOUS | Status: DC | PRN
  Administered 2019-05-20: 40 mg via INTRAVENOUS

## 2019-05-20 MED ORDER — INDOMETHACIN 50 MG RE SUPP
RECTAL | Status: DC | PRN
  Administered 2019-05-20: 100 mg via RECTAL

## 2019-05-20 MED ORDER — ONDANSETRON HCL 4 MG/2ML IJ SOLN
INTRAMUSCULAR | Status: DC | PRN
  Administered 2019-05-20: 4 mg via INTRAVENOUS

## 2019-05-20 MED ORDER — GLUCAGON HCL RDNA (DIAGNOSTIC) 1 MG IJ SOLR
INTRAMUSCULAR | Status: AC
  Filled 2019-05-20: qty 1

## 2019-05-20 MED ORDER — NEOSTIGMINE METHYLSULFATE 10 MG/10ML IV SOLN
INTRAVENOUS | Status: DC | PRN
  Administered 2019-05-20: 3 mg via INTRAVENOUS

## 2019-05-20 MED ORDER — INDOMETHACIN 50 MG RE SUPP
RECTAL | Status: AC
  Filled 2019-05-20: qty 2

## 2019-05-20 MED ORDER — SODIUM CHLORIDE 0.9 % IV SOLN
INTRAVENOUS | Status: DC | PRN
  Administered 2019-05-20: 80 mL

## 2019-05-20 MED ORDER — CEFAZOLIN SODIUM-DEXTROSE 1-4 GM/50ML-% IV SOLN
1.0000 g | Freq: Once | INTRAVENOUS | Status: AC
  Administered 2019-05-20: 1 g via INTRAVENOUS
  Filled 2019-05-20: qty 50

## 2019-05-20 MED ORDER — SODIUM CHLORIDE 0.9 % IV SOLN
INTRAVENOUS | Status: DC
  Administered 2019-05-20: 12:00:00 via INTRAVENOUS

## 2019-05-20 MED ORDER — FENTANYL CITRATE (PF) 100 MCG/2ML IJ SOLN
INTRAMUSCULAR | Status: DC | PRN
  Administered 2019-05-20 (×2): 50 ug via INTRAVENOUS

## 2019-05-20 MED ORDER — CISATRACURIUM BESYLATE 20 MG/10ML IV SOLN
INTRAVENOUS | Status: AC
  Filled 2019-05-20: qty 10

## 2019-05-20 MED ORDER — FENTANYL CITRATE (PF) 100 MCG/2ML IJ SOLN
INTRAMUSCULAR | Status: AC
  Filled 2019-05-20: qty 2

## 2019-05-20 MED ORDER — LIDOCAINE HCL (CARDIAC) PF 100 MG/5ML IV SOSY
PREFILLED_SYRINGE | INTRAVENOUS | Status: DC | PRN
  Administered 2019-05-20: 60 mg via INTRATRACHEAL

## 2019-05-20 MED ORDER — DEXAMETHASONE SODIUM PHOSPHATE 10 MG/ML IJ SOLN
INTRAMUSCULAR | Status: DC | PRN
  Administered 2019-05-20: 10 mg via INTRAVENOUS

## 2019-05-20 MED ORDER — GLYCOPYRROLATE 0.2 MG/ML IJ SOLN
INTRAMUSCULAR | Status: DC | PRN
  Administered 2019-05-20: 0.4 mg via INTRAVENOUS

## 2019-05-20 NOTE — Transfer of Care (Signed)
Immediate Anesthesia Transfer of Care Note  Patient: [name]  Procedure(s) Performed: ENDOSCOPIC RETROGRADE CHOLANGIOPANCREATOGRAPHY (ERCP) (N/A ) SPYGLASS CHOLANGIOSCOPY (N/A ) SPYGLASS LITHOTRIPSY (N/A )  Patient Location: Endoscopy Unit  Anesthesia Type:General  Level of Consciousness: drowsy  Airway & Oxygen Therapy: Patient Spontanous Breathing and Patient connected to face mask oxygen  Post-op Assessment: Report given to RN and Post -op Vital signs reviewed and stable  Post vital signs: Reviewed and stable  Last Vitals:  Vitals Value Taken Time  BP    Temp    Pulse 73 05/20/19 1530  Resp 20 05/20/19 1530  SpO2 100 % 05/20/19 1530  Vitals shown include unvalidated device data.  Last Pain:  Vitals:   05/20/19 1131  TempSrc: Temporal  PainSc: 0-No pain         Complications: No apparent anesthesia complications

## 2019-05-20 NOTE — Anesthesia Preprocedure Evaluation (Addendum)
Anesthesia Evaluation  Patient identified by MRN, date of birth, ID band Patient awake    Reviewed: Allergy & Precautions, NPO status , Patient's Chart, lab work & pertinent test results  History of Anesthesia Complications Negative for: history of anesthetic complications  Airway Mallampati: II  TM Distance: >3 FB Neck ROM: Full    Dental  (+) Dental Advisory Given   Pulmonary sleep apnea and Continuous Positive Airway Pressure Ventilation , former smoker,    Pulmonary exam normal        Cardiovascular hypertension, (-) angina+ CAD, + Past MI, + Cardiac Stents and + Peripheral Vascular Disease  Normal cardiovascular exam     Neuro/Psych PSYCHIATRIC DISORDERS Depression Dementia  Neuromuscular disease (peripheral neuropathy) CVA, No Residual Symptoms    GI/Hepatic Neg liver ROS, PUD, GERD  Controlled,  Endo/Other  diabetes, Well Controlled, Type 2 Obesity   Renal/GU ESRF and DialysisRenal disease     Musculoskeletal  (+) Arthritis ,  Gout    Abdominal   Peds  Hematology negative hematology ROS (+)   Anesthesia Other Findings   Reproductive/Obstetrics                            Anesthesia Physical Anesthesia Plan  ASA: III  Anesthesia Plan: General   Post-op Pain Management:    Induction: Intravenous  PONV Risk Score and Plan: 2 and Treatment may vary due to age or medical condition, Ondansetron and Dexamethasone  Airway Management Planned: Oral ETT  Additional Equipment: None  Intra-op Plan:   Post-operative Plan: Extubation in OR  Informed Consent: I have reviewed the patients History and Physical, chart, labs and discussed the procedure including the risks, benefits and alternatives for the proposed anesthesia with the patient or authorized representative who has indicated his/her understanding and acceptance.     Dental advisory given  Plan Discussed with: CRNA and  Anesthesiologist  Anesthesia Plan Comments:        Anesthesia Quick Evaluation

## 2019-05-20 NOTE — Progress Notes (Signed)
[name] 12:15 PM  Subjective: Patient without any new complaints since we last saw him in the office recently and no more febrile spells or abdominal complaints and we answered all of his questions  Objective: Vital signs stable afebrile no acute distress exam please see preassessment evaluation July labs reviewed  Assessment: Residual CBD stones  Plan: Okay to proceed with ERCP and hopefully stone extraction with anesthesia assistance  [facility]  office [phone] After 5PM or if no answer call [phone]

## 2019-05-20 NOTE — Discharge Instructions (Signed)
YOU HAD AN ENDOSCOPIC PROCEDURE TODAY: Refer to the procedure report and other information in the discharge instructions given to you for any specific questions about what was found during the examination. If this information does not answer your questions, please call [facility] office at [phone] to clarify.   YOU SHOULD EXPECT: Some feelings of bloating in the abdomen. Passage of more gas than usual. Walking can help get rid of the air that was put into your GI tract during the procedure and reduce the bloating. If you had a lower endoscopy (such as a colonoscopy or flexible sigmoidoscopy) you may notice spotting of blood in your stool or on the toilet paper. Some abdominal soreness may be present for a day or two, also.  DIET: Your first meal following the procedure should be a light meal and then it is ok to progress to your normal diet. A half-sandwich or bowl of soup is an example of a good first meal. Heavy or fried foods are harder to digest and may make you feel nauseous or bloated. Drink plenty of fluids but you should avoid alcoholic beverages for 24 hours. If you had a esophageal dilation, please see attached instructions for diet.   ACTIVITY: Your care partner should take you home directly after the procedure. You should plan to take it easy, moving slowly for the rest of the day. You can resume normal activity the day after the procedure however YOU SHOULD NOT DRIVE, use power tools, machinery or perform tasks that involve climbing or major physical exertion for 24 hours (because of the sedation medicines used during the test).   SYMPTOMS TO REPORT IMMEDIATELY: A gastroenterologist can be reached at any hour. Please call [phone]  for any of the following symptoms:   Following upper endoscopy (EGD, EUS, ERCP, esophageal dilation) Vomiting of blood or coffee ground material  New, significant abdominal pain  New, significant chest pain or pain under the shoulder blades  Painful or  persistently difficult swallowing  New shortness of breath  Black, tarry-looking or red, bloody stools  FOLLOW UP:  If any biopsies were taken you will be contacted by phone or by letter within the next 1-3 weeks. Call [phone]  if you have not heard about the biopsies in 3 weeks.  Please also call with any specific questions about appointments or follow up tests. Call if question or problem otherwise clear liquids only until 8 PM and if doing well may have soft solids or advance diet tomorrow and otherwise follow-up in the office as needed and plan on repeat ERCP in 1 to 2 months when convenient

## 2019-05-20 NOTE — Anesthesia Procedure Notes (Signed)
Procedure Name: Intubation Date/Time: 05/20/2019 12:48 PM Performed by: Sharlette Dense, CRNA Patient Re-evaluated:Patient Re-evaluated prior to induction Oxygen Delivery Method: Circle system utilized Preoxygenation: Pre-oxygenation with 100% oxygen Induction Type: IV induction Ventilation: Mask ventilation without difficulty and Oral airway inserted - appropriate to patient size Laryngoscope Size: Miller and 3 Grade View: Grade I Tube type: Oral Tube size: 7.5 mm Number of attempts: 1 Airway Equipment and Method: Stylet Placement Confirmation: ETT inserted through vocal cords under direct vision,  positive ETCO2 and breath sounds checked- equal and bilateral Secured at: 22 cm Tube secured with: Tape Dental Injury: Teeth and Oropharynx as per pre-operative assessment

## 2019-05-20 NOTE — Progress Notes (Signed)
I-stat lab results K-4.6, Hct-35, Hbg-11.9, Na -139

## 2019-05-20 NOTE — Op Note (Signed)
[facility] Patient Name: [name] Procedure Date: 05/20/2019 MRN: [id number] Attending MD: Clarene Essex , MD Date of Birth: 03-Jun-1951 CSN: [id number] Age: 68 Admit Type: Outpatient Procedure:                ERCP Indications:              For therapy of bile duct stone(s) difficult to                            remove CBD stones Providers:                Clarene Essex, MD, Cleda Daub, RN, Elspeth Cho                            Tech., Technician, Danley Danker, CRNA Referring MD:              Medicines:                General Anesthesia Complications:            No immediate complications. Estimated Blood Loss:     Estimated blood loss: none. Procedure:                Pre-Anesthesia Assessment:                           - Prior to the procedure, a History and Physical                            was performed, and patient medications and                            allergies were reviewed. The patient's tolerance of                            previous anesthesia was also reviewed. The risks                            and benefits of the procedure and the sedation                            options and risks were discussed with the patient.                            All questions were answered, and informed consent                            was obtained. Prior Anticoagulants: The patient has                            taken no previous anticoagulant or antiplatelet                            agents except for aspirin. ASA Grade Assessment:  III - A patient with severe systemic disease. After                            reviewing the risks and benefits, the patient was                            deemed in satisfactory condition to undergo the                            procedure.                           After obtaining informed consent, the scope was                            passed under direct vision. Throughout the                             procedure, the patient's blood pressure, pulse, and                            oxygen saturations were monitored continuously. The                            [id number]-Q180V ([id number] Olympus duodenoscope was                            introduced through the mouth, and used to inject                            contrast into and used to cannulate the bile duct.                            The ERCP was technically difficult and complex due                            to abnormal anatomy. Successful completion of the                            procedure was aided by performing the maneuvers                            documented (below) in this report. The patient                            tolerated the procedure well. Scope In: Scope Out: Findings:      One plastic stent originating in the biliary tree was emerging from the       major papilla. One stent was removed from the biliary tree using a       Raptor grasping device and snare. Deep selective cannulation was readily       obtained and the biliary sphincterotomy was extended with a Hydratome       sphincterotome using ERBE electrocautery. There was no  post-sphincterotomy bleeding. To discover objects, the biliary tree was       swept with an adjustable 12- 15 mm balloon starting at the bifurcation.       Sludge was swept from the duct. Many stones were removed. A few stones       remained. The bile duct was explored endoscopically using the SpyGlass       direct visualization system. The SpyScope was advanced to the upper       third of the main bile duct. Visibility with the scope was good. The       common bile duct contained multiple stones, the largest of which was       medium-sized in diameter. Electrohydraulic lithotripsy was successful.       And multiple stones were crushed and then the biliary tree was swept       with a 12 mm balloon starting at the upper third of the main bile duct.       Sludge was swept from the  duct. Many stones were removed. A few stones       remained. We did have difficulty pulling the balloon through the patent       sphincterotomy site so we proceeded with a dilation of the common bile       duct with a 07-12-11 mm balloon (to a maximum balloon size of 12 mm)       dilator which was successful. The balloon was 5.5 cm long and then to       discover objects, the biliary tree was swept with an 12- 15 mm       adjustable balloon starting at the bifurcation. Sludge was swept from       the duct. Many stones were removed. A few stones remained. We had a       moderate amount of air in the system which made occlusion cholangiogram       difficult and we could pull the 15 mm balloon to the distal duct but       needed to lower it to 12 mm to pull through the ampulla and after a       prolonged effort we elected to place one 10 Fr by 7 cm plastic stent       with a single external flap and a single internal flap was placed 6.5 cm       into the common bile duct. Bile flowed through the stent. The stent was       in good position. The introducer wire and scope were removed and we       stopped the procedure at this point and there was no pancreatic duct       injection or wire advancement throughout the procedure and the patient       tolerated the procedure well Impression:               - One stent from the biliary tree was seen in the                            major papilla which was removed.                           - Choledocholithiasis was found. Partial removal  was accomplished with biliary sphincterotomy; a                            stent was inserted at the end of the procedure in                            order to decrease risk of future cholangitis until                            repeat procedure can be done.                           - One stent was removed from the biliary tree at                            the beginning of the procedure as  above.                           - A biliary sphincterotomy was performed.                           - The biliary tree was swept multiple times after                            sphincterotomy lithotripsy and dilation.                           - Lithotripsy was successful.                           - The biliary tree was swept.                           - Common bile duct was successfully dilated.                           - The biliary tree was swept at the end of the                            procedure but since we did not feel comfortable                            that we had totally gotten all stones removed we                            elected to place.                           - One plastic stent was placed into the common bile                            duct. Moderate Sedation:      Not Applicable - Patient had care per Anesthesia. Recommendation:           -  Patient has a contact number available for                            emergencies. The signs and symptoms of potential                            delayed complications were discussed with the                            patient. Return to normal activities tomorrow.                            Written discharge instructions were provided to the                            patient.                           - Clear liquid diet for 6 hours. Then soft solids                            later this evening if doing well                           - No aspirin, ibuprofen, naproxen, or other                            non-steroidal anti-inflammatory drugs for 2 days.                           - Return to GI clinic PRN.                           - Telephone GI clinic if symptomatic PRN.                           - Repeat ERCP in 1-2 month for retreatment. Procedure Code(s):        --- Professional ---                           [phone], Endoscopic retrograde                            cholangiopancreatography (ERCP); with removal and                             exchange of stent(s), biliary or pancreatic duct,                            including pre- and post-dilation and guide wire                            passage, when performed, including sphincterotomy,  when performed, each stent exchanged                           [id number], Endoscopic retrograde                            cholangiopancreatography (ERCP); with destruction                            of calculi, any method (eg, mechanical,                            electrohydraulic, lithotripsy)                           [phone], Endoscopic cannulation of papilla with                            direct visualization of pancreatic/common bile                            duct(s) (List separately in addition to code(s) for                            primary procedure) Diagnosis Code(s):        --- Professional ---                           V61.60, Presence of other specified functional                            implants                           K80.50, Calculus of bile duct without cholangitis                            or cholecystitis without obstruction                           Z46.59, Encounter for fitting and adjustment of                            other gastrointestinal appliance and device CPT copyright 2019 [employer]. All rights reserved. The codes documented in this report are preliminary and upon coder review may  be revised to meet current compliance requirements. Clarene Essex, MD 05/20/2019 3:30:24 PM This report has been signed electronically. Number of Addenda: 0

## 2019-05-21 ENCOUNTER — Encounter (HOSPITAL_COMMUNITY): Payer: Self-pay | Admitting: Gastroenterology

## 2019-05-21 DIAGNOSIS — Z992 Dependence on renal dialysis: Secondary | ICD-10-CM | POA: Diagnosis not present

## 2019-05-21 DIAGNOSIS — D631 Anemia in chronic kidney disease: Secondary | ICD-10-CM | POA: Diagnosis not present

## 2019-05-21 DIAGNOSIS — N186 End stage renal disease: Secondary | ICD-10-CM | POA: Diagnosis not present

## 2019-05-21 DIAGNOSIS — D509 Iron deficiency anemia, unspecified: Secondary | ICD-10-CM | POA: Diagnosis not present

## 2019-05-21 DIAGNOSIS — E119 Type 2 diabetes mellitus without complications: Secondary | ICD-10-CM | POA: Diagnosis not present

## 2019-05-21 DIAGNOSIS — N2581 Secondary hyperparathyroidism of renal origin: Secondary | ICD-10-CM | POA: Diagnosis not present

## 2019-05-21 NOTE — Anesthesia Postprocedure Evaluation (Signed)
Anesthesia Post Note  Patient: [name]  Procedure(s) Performed: ENDOSCOPIC RETROGRADE CHOLANGIOPANCREATOGRAPHY (ERCP) (N/A ) SPYGLASS CHOLANGIOSCOPY (N/A ) SPYGLASS LITHOTRIPSY (N/A ) SPHINCTEROTOMY STENT REMOVAL BILIARY STENT PLACEMENT (N/A ) BILIARY DILATION     Patient location during evaluation: PACU Anesthesia Type: General Level of consciousness: awake and alert Pain management: pain level controlled Vital Signs Assessment: post-procedure vital signs reviewed and stable Respiratory status: spontaneous breathing, nonlabored ventilation and respiratory function stable Cardiovascular status: blood pressure returned to baseline and stable Postop Assessment: no apparent nausea or vomiting Anesthetic complications: no    Last Vitals:  Vitals:   05/20/19 1610 05/20/19 1620  BP: (!) 146/68 (!) 150/73  Pulse: 74 69  Resp: 10 16  Temp:    SpO2: 100% 100%    Last Pain:  Vitals:   05/20/19 1620  TempSrc:   PainSc: 0-No pain                 Audry Pili

## 2019-05-23 DIAGNOSIS — E119 Type 2 diabetes mellitus without complications: Secondary | ICD-10-CM | POA: Diagnosis not present

## 2019-05-23 DIAGNOSIS — D509 Iron deficiency anemia, unspecified: Secondary | ICD-10-CM | POA: Diagnosis not present

## 2019-05-23 DIAGNOSIS — Z992 Dependence on renal dialysis: Secondary | ICD-10-CM | POA: Diagnosis not present

## 2019-05-23 DIAGNOSIS — N186 End stage renal disease: Secondary | ICD-10-CM | POA: Diagnosis not present

## 2019-05-23 DIAGNOSIS — N2581 Secondary hyperparathyroidism of renal origin: Secondary | ICD-10-CM | POA: Diagnosis not present

## 2019-05-23 DIAGNOSIS — D631 Anemia in chronic kidney disease: Secondary | ICD-10-CM | POA: Diagnosis not present

## 2019-05-25 DIAGNOSIS — I5082 Biventricular heart failure: Secondary | ICD-10-CM | POA: Diagnosis not present

## 2019-05-25 DIAGNOSIS — I132 Hypertensive heart and chronic kidney disease with heart failure and with stage 5 chronic kidney disease, or end stage renal disease: Secondary | ICD-10-CM | POA: Diagnosis not present

## 2019-05-25 DIAGNOSIS — N186 End stage renal disease: Secondary | ICD-10-CM | POA: Diagnosis not present

## 2019-05-25 DIAGNOSIS — Z48815 Encounter for surgical aftercare following surgery on the digestive system: Secondary | ICD-10-CM | POA: Diagnosis not present

## 2019-05-25 DIAGNOSIS — I5042 Chronic combined systolic (congestive) and diastolic (congestive) heart failure: Secondary | ICD-10-CM | POA: Diagnosis not present

## 2019-05-25 DIAGNOSIS — E1122 Type 2 diabetes mellitus with diabetic chronic kidney disease: Secondary | ICD-10-CM | POA: Diagnosis not present

## 2019-05-26 ENCOUNTER — Emergency Department (HOSPITAL_COMMUNITY)
Admission: EM | Admit: 2019-05-26 | Discharge: 2019-05-27 | Discharge status: Against Medical Advice | Payer: Medicare Other | Attending: Emergency Medicine | Admitting: Emergency Medicine

## 2019-05-26 ENCOUNTER — Encounter (HOSPITAL_COMMUNITY): Payer: Self-pay | Admitting: Emergency Medicine

## 2019-05-26 DIAGNOSIS — N186 End stage renal disease: Secondary | ICD-10-CM | POA: Diagnosis not present

## 2019-05-26 DIAGNOSIS — N2581 Secondary hyperparathyroidism of renal origin: Secondary | ICD-10-CM | POA: Diagnosis not present

## 2019-05-26 DIAGNOSIS — R52 Pain, unspecified: Secondary | ICD-10-CM | POA: Diagnosis not present

## 2019-05-26 DIAGNOSIS — R1084 Generalized abdominal pain: Secondary | ICD-10-CM | POA: Diagnosis not present

## 2019-05-26 DIAGNOSIS — Z5321 Procedure and treatment not carried out due to patient leaving prior to being seen by health care provider: Secondary | ICD-10-CM | POA: Insufficient documentation

## 2019-05-26 DIAGNOSIS — D509 Iron deficiency anemia, unspecified: Secondary | ICD-10-CM | POA: Diagnosis not present

## 2019-05-26 DIAGNOSIS — D631 Anemia in chronic kidney disease: Secondary | ICD-10-CM | POA: Diagnosis not present

## 2019-05-26 DIAGNOSIS — R0902 Hypoxemia: Secondary | ICD-10-CM | POA: Diagnosis not present

## 2019-05-26 DIAGNOSIS — Z992 Dependence on renal dialysis: Secondary | ICD-10-CM | POA: Diagnosis not present

## 2019-05-26 DIAGNOSIS — E119 Type 2 diabetes mellitus without complications: Secondary | ICD-10-CM | POA: Diagnosis not present

## 2019-05-26 LAB — CBC
HCT: 40.4 % (ref 39.0–52.0)
Hemoglobin: 12.8 g/dL — ABNORMAL LOW (ref 13.0–17.0)
MCH: 31.6 pg (ref 26.0–34.0)
MCHC: 31.7 g/dL (ref 30.0–36.0)
MCV: 99.8 fL (ref 80.0–100.0)
Platelets: 277 10*3/uL (ref 150–400)
RBC: 4.05 MIL/uL — ABNORMAL LOW (ref 4.22–5.81)
RDW: 16.3 % — ABNORMAL HIGH (ref 11.5–15.5)
WBC: 9.2 10*3/uL (ref 4.0–10.5)
nRBC: 0 % (ref 0.0–0.2)

## 2019-05-26 MED ORDER — SODIUM CHLORIDE 0.9% FLUSH: 3.0000 mL | Freq: Once | INTRAVENOUS | Status: DC

## 2019-05-26 NOTE — ED Triage Notes (Signed)
Pt arrives by [facility] for generalized abd pain that started after he got home from his full treatment of dialysis. Pt had gall stones removed over 1 week ago.    122/88  100% ra 139 cgb  88

## 2019-05-27 NOTE — ED Notes (Signed)
Pt. Called for vitals recheck x3. No answer. Appears pt. Has left. Pulling OTF.

## 2019-05-28 DIAGNOSIS — E119 Type 2 diabetes mellitus without complications: Secondary | ICD-10-CM | POA: Diagnosis not present

## 2019-05-28 DIAGNOSIS — N2581 Secondary hyperparathyroidism of renal origin: Secondary | ICD-10-CM | POA: Diagnosis not present

## 2019-05-28 DIAGNOSIS — D509 Iron deficiency anemia, unspecified: Secondary | ICD-10-CM | POA: Diagnosis not present

## 2019-05-28 DIAGNOSIS — Z992 Dependence on renal dialysis: Secondary | ICD-10-CM | POA: Diagnosis not present

## 2019-05-28 DIAGNOSIS — N186 End stage renal disease: Secondary | ICD-10-CM | POA: Diagnosis not present

## 2019-05-28 DIAGNOSIS — D631 Anemia in chronic kidney disease: Secondary | ICD-10-CM | POA: Diagnosis not present

## 2019-05-29 ENCOUNTER — Other Ambulatory Visit: Payer: Self-pay

## 2019-05-30 DIAGNOSIS — E119 Type 2 diabetes mellitus without complications: Secondary | ICD-10-CM | POA: Diagnosis not present

## 2019-05-30 DIAGNOSIS — D631 Anemia in chronic kidney disease: Secondary | ICD-10-CM | POA: Diagnosis not present

## 2019-05-30 DIAGNOSIS — Z992 Dependence on renal dialysis: Secondary | ICD-10-CM | POA: Diagnosis not present

## 2019-05-30 DIAGNOSIS — N2581 Secondary hyperparathyroidism of renal origin: Secondary | ICD-10-CM | POA: Diagnosis not present

## 2019-05-30 DIAGNOSIS — N186 End stage renal disease: Secondary | ICD-10-CM | POA: Diagnosis not present

## 2019-05-30 DIAGNOSIS — D509 Iron deficiency anemia, unspecified: Secondary | ICD-10-CM | POA: Diagnosis not present

## 2019-06-01 ENCOUNTER — Ambulatory Visit (INDEPENDENT_AMBULATORY_CARE_PROVIDER_SITE_OTHER): Payer: Medicare Other | Admitting: Endocrinology

## 2019-06-01 ENCOUNTER — Encounter: Payer: Self-pay | Admitting: Endocrinology

## 2019-06-01 ENCOUNTER — Other Ambulatory Visit: Payer: Self-pay

## 2019-06-01 VITALS — BP 142/72 | HR 108 | Ht 72.0 in | Wt 227.0 lb

## 2019-06-01 DIAGNOSIS — E0590 Thyrotoxicosis, unspecified without thyrotoxic crisis or storm: Secondary | ICD-10-CM | POA: Diagnosis not present

## 2019-06-01 DIAGNOSIS — N184 Chronic kidney disease, stage 4 (severe): Secondary | ICD-10-CM | POA: Diagnosis not present

## 2019-06-01 DIAGNOSIS — E1122 Type 2 diabetes mellitus with diabetic chronic kidney disease: Secondary | ICD-10-CM

## 2019-06-01 DIAGNOSIS — I639 Cerebral infarction, unspecified: Secondary | ICD-10-CM | POA: Diagnosis not present

## 2019-06-01 LAB — POCT GLYCOSYLATED HEMOGLOBIN (HGB A1C): Hemoglobin A1C: 6 % — AB (ref 4.0–5.6)

## 2019-06-01 NOTE — Patient Instructions (Addendum)
Blood tests are requested for you today.  We'll let you know about the results.  Please continue the same medications. Keep the foot ulcer, and your left middle finger covered with antibiotic ointment and large bandaids, until they heal.  Please call next week if it is not much better by then. Your blood pressure is high today.  Please have it rechecked at dialysis Please come back for a follow-up appointment in 6 months.

## 2019-06-01 NOTE — Progress Notes (Signed)
Subjective:    Patient ID: [name], male    DOB: August 29, 1951, 68 y.o.   MRN: [id number]  HPI Pt returns for f/u of diabetes mellitus:  DM type: 2 Dx'ed: 8841 Complications: polyneuropathy, renal failure (HD), CVA, and CAD.   Therapy: Welchol.   DKA: never.  Severe hypoglycemia: never.  Pancreatitis: never.  Other: renal failure limits rx options; DM med needs have decreased with worsening of renal function.   Interval history: pt states cbg's are well-controlled.  There is no trend throughout the day.   Pt also has hyperthyroidism (due to a multinodular goiter; dx'ed 2009; bx then was benign; nuc med scan showed very low uptake, so tapazole was chosen as rx).  He takes tapazole as rx'ed.    Past Medical History:  Diagnosis Date  . Acute encephalopathy 10/04/2018  . Allergic rhinitis, cause unspecified 02/24/2014  . Anemia 06/16/2011  . BENIGN PROSTATIC HYPERTROPHY 10/14/2009  . CAD, NATIVE VESSEL 02/06/2009   a. 06/2007 s/p Taxus DES to the RCA;  b. 08/2016 NSTEMI in setting of SVT/PCI: LM 30ost, LAD 13m (3.0x16 Synergy DES), LCX 62m, OM1 60, RI 40, RCA 70p/m, 56m - not amenable to PCI.  [name] Cervical radiculopathy, chronic 02/23/2016   Right c5-6 by NCS/EMG  . Chest pain 08/09/2015  . Chronic combined systolic (congestive) and diastolic (congestive) heart failure ([place])    a. 10/2016 Echo: EF 40-45%, Gr2 DD. mildly dil LA.  [name] COLONIC POLYPS, HX OF 10/14/2009  . Dementia ([place]) [id number]  . Depression 09/24/2015  . DIABETES MELLITUS, TYPE II 02/01/2010  . DYSLIPIDEMIA 06/18/2007  . ESRD (end stage renal disease) on dialysis ([place]) 08/04/2010   ESRD due to DM adn HTN, started HD in 2011. As of 2019 has a R arm graft and gets HD on TTS sched  . FOOT PAIN 08/12/2008  . GAIT DISTURBANCE 03/03/2010  . GASTROENTERITIS, VIRAL 10/14/2009  . GERD 06/18/2007  . GOITER, MULTINODULAR 12/26/2007  . GOUT 06/18/2007  . GYNECOMASTIA 07/17/2010  . Hemodialysis access, fistula mature [facility])    Dialysis  [facility] ([place]) Right upper arm fistula  . Hyperlipidemia 10/16/2011  . Hyperparathyroidism, secondary ([place]) 06/16/2011  . HYPERTENSION 06/18/2007  . Hyperthyroidism   . Hypocalcemia 06/07/2010  . Ischemic cardiomyopathy    a. 10/2016 Echo: EF 40-45%.  . Lumbar stenosis with neurogenic claudication   . ONYCHOMYCOSIS, TOENAILS 12/26/2007  . OSA on CPAP 10/16/2011  . Other malaise and fatigue 11/24/2009  . PERIPHERAL NEUROPATHY 06/18/2007  . Prostate cancer ([place])   . PSVT (paroxysmal supraventricular tachycardia) ([place])    a. 07/9322 complicated by NSTEMI;  b. 11/2016 Treated w/ adenosine in ED;  c. 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  [name] PULMONARY NODULE, RIGHT LOWER LOBE 06/08/2009  . Sleep apnea    cpap machine and o2  . TRANSAMINASES, SERUM, ELEVATED 02/01/2010  . Transfusion history    none recent  . Unspecified hypotension 01/30/2010    Past Surgical History:  Procedure Laterality Date  . A/V SHUNTOGRAM N/A 04/07/2018   Procedure: A/V SHUNTOGRAM;  Surgeon: Waynetta Sandy, MD;  Location: [place] [facility];  Service: Cardiovascular;  Laterality: N/A;  . ARTERIOVENOUS GRAFT PLACEMENT Right 2009   forearm/notes 02/01/2011  . AV FISTULA PLACEMENT  11/07/2011   Procedure: INSERTION OF ARTERIOVENOUS (AV) GORE-TEX GRAFT ARM;  Surgeon: Tinnie Gens, MD;  Location: [place];  Service: Vascular;  Laterality: Left;  . BACK SURGERY  1998  . BALLOON DILATION N/A 11/14/2018  Procedure: BALLOON DILATION;  Surgeon: Ronnette Juniper, MD;  Location: [name];  Service: Gastroenterology;  Laterality: N/A;  . BASCILIC VEIN TRANSPOSITION Right 02/27/2013   Procedure: BASCILIC VEIN TRANSPOSITION;  Surgeon: Mal Misty, MD;  Location: [place];  Service: Vascular;  Laterality: Right;  Right Basilic Vein Transposition   . BILIARY DILATION  03/31/2019   Procedure: BILIARY DILATION;  Surgeon: Clarene Essex, MD;  Location: [facility] ENDOSCOPY;  Service: Endoscopy;;  . BILIARY DILATION  05/20/2019   Procedure:  BILIARY DILATION;  Surgeon: Clarene Essex, MD;  Location: [facility] ENDOSCOPY;  Service: Endoscopy;;  . BILIARY STENT PLACEMENT  03/31/2019   Procedure: BILIARY STENT PLACEMENT;  Surgeon: Clarene Essex, MD;  Location: [facility] ENDOSCOPY;  Service: Endoscopy;;  . BILIARY STENT PLACEMENT N/A 05/20/2019   Procedure: BILIARY STENT PLACEMENT;  Surgeon: Clarene Essex, MD;  Location: [facility] ENDOSCOPY;  Service: Endoscopy;  Laterality: N/A;  . CARDIAC CATHETERIZATION N/A 08/06/2016   Procedure: Left Heart Cath and Coronary Angiography;  Surgeon: Jolaine Artist, MD;  Location: [place] [facility];  Service: Cardiovascular;  Laterality: N/A;  . CARDIAC CATHETERIZATION N/A 08/07/2016   Procedure: Coronary/Graft Atherectomy-CSI LAD;  Surgeon: Peter M Martinique, MD;  Location: [place] [facility];  Service: Cardiovascular;  Laterality: N/A;  . CERVICAL SPINE SURGERY  2/09   "to repair nerve problems in my left arm"  . CHOLECYSTECTOMY    . COLONOSCOPY WITH PROPOFOL N/A 04/26/2017   Procedure: COLONOSCOPY WITH PROPOFOL;  Surgeon: Otis Brace, MD;  Location: [place];  Service: Gastroenterology;  Laterality: N/A;  . CORONARY ANGIOPLASTY WITH STENT PLACEMENT  06/11/2008  . CORONARY ANGIOPLASTY WITH STENT PLACEMENT  06/2007   TAXUS stent to RCA/notes 01/31/2011  . ENDOSCOPIC RETROGRADE CHOLANGIOPANCREATOGRAPHY (ERCP) WITH PROPOFOL N/A 03/31/2019   Procedure: ENDOSCOPIC RETROGRADE CHOLANGIOPANCREATOGRAPHY (ERCP) WITH PROPOFOL;  Surgeon: Clarene Essex, MD;  Location: [place];  Service: Endoscopy;  Laterality: N/A;  . ERCP N/A 11/14/2018   Procedure: ENDOSCOPIC RETROGRADE CHOLANGIOPANCREATOGRAPHY (ERCP);  Surgeon: Ronnette Juniper, MD;  Location: [place];  Service: Gastroenterology;  Laterality: N/A;  . ERCP N/A 05/20/2019   Procedure: ENDOSCOPIC RETROGRADE CHOLANGIOPANCREATOGRAPHY (ERCP);  Surgeon: Clarene Essex, MD;  [facility];  Service: Endoscopy;  Laterality: N/A;  . ESOPHAGOGASTRODUODENOSCOPY  09/28/2011   Procedure:  ESOPHAGOGASTRODUODENOSCOPY (EGD);  Surgeon: Missy Sabins, MD;  Location: [facility] ENDOSCOPY;  Service: Endoscopy;  Laterality: N/A;  . ESOPHAGOGASTRODUODENOSCOPY N/A 04/07/2015   Procedure: ESOPHAGOGASTRODUODENOSCOPY (EGD);  Surgeon: Teena Irani, MD;  Location: [facility];  Service: Endoscopy;  Laterality: N/A;  . ESOPHAGOGASTRODUODENOSCOPY N/A 04/19/2015   Procedure: ESOPHAGOGASTRODUODENOSCOPY (EGD);  Surgeon: Arta Silence, MD;  Location: [facility] ENDOSCOPY;  Service: Endoscopy;  Laterality: N/A;  . ESOPHAGOGASTRODUODENOSCOPY  11/14/2018   Procedure: ESOPHAGOGASTRODUODENOSCOPY (EGD);  Surgeon: Ronnette Juniper, MD;  Location: [facility] ENDOSCOPY;  Service: Gastroenterology;;  . ESOPHAGOGASTRODUODENOSCOPY (EGD) WITH PROPOFOL N/A 06/13/2018   Procedure: ESOPHAGOGASTRODUODENOSCOPY (EGD) WITH PROPOFOL;  Surgeon: Wonda Horner, MD;  Location: [facility] ENDOSCOPY;  Service: Endoscopy;  Laterality: N/A;  . FLEXIBLE SIGMOIDOSCOPY N/A 05/21/2017   Procedure: Conni Elliot;  Surgeon: Clarene Essex, MD;  Location: [place];  Service: Endoscopy;  Laterality: N/A;  . FLEXIBLE SIGMOIDOSCOPY Left 07/02/2017   Procedure: FLEXIBLE SIGMOIDOSCOPY;  Surgeon: Laurence Spates, MD;  Location: [place];  Service: Endoscopy;  Laterality: Left;  . FOREIGN BODY REMOVAL  09/2003   via upper endoscopy/notes 02/12/2011  . GIVENS CAPSULE STUDY  09/30/2011   Procedure: GIVENS CAPSULE STUDY;  Surgeon: Jeryl Columbia, MD;  Location: [facility] ENDOSCOPY;  Service: Endoscopy;  Laterality: N/A;  . INSERTION OF DIALYSIS CATHETER Right 2014  . INSERTION OF DIALYSIS CATHETER Left 02/11/2013   Procedure: INSERTION OF DIALYSIS CATHETER;  Surgeon: Conrad [place], MD;  Location: [place];  Service: Vascular;  Laterality: Left;  Ultrasound guided  . IR AV DIALY SHUNT INTRO NEEDLE/INTRACATH INITIAL W/PTA/IMG RIGHT Right 11/14/2018  . IR US GUIDE VASC ACCESS RIGHT  11/14/2018  . LUMBAR LAMINECTOMY/DECOMPRESSION MICRODISCECTOMY Bilateral 07/31/2017   Procedure:  LAMINECTOMY AND FORAMINOTOMY- BILATERAL LUMBAR TWO- LUMBAR THREE;  Surgeon: Earnie Larsson, MD;  Location: [place];  Service: Neurosurgery;  Laterality: Bilateral;  LAMINECTOMY AND FORAMINOTOMY- BILATERAL LUMBAR 2- LUMBAR 3  . PERIPHERAL VASCULAR BALLOON ANGIOPLASTY  04/07/2018   Procedure: PERIPHERAL VASCULAR BALLOON ANGIOPLASTY;  Surgeon: Waynetta Sandy, MD;  Location: [place] [facility];  Service: Cardiovascular;;  RUE AVF  . REMOVAL OF A DIALYSIS CATHETER Right 02/11/2013   Procedure: REMOVAL OF A DIALYSIS CATHETER;  Surgeon: Conrad [place], MD;  Location: [place];  Service: Vascular;  Laterality: Right;  . REMOVAL OF STONES  11/14/2018   Procedure: REMOVAL OF STONES;  Surgeon: Ronnette Juniper, MD;  Location: [facility] ENDOSCOPY;  Service: Gastroenterology;;  . REMOVAL OF STONES  03/31/2019   Procedure: REMOVAL OF STONES;  Surgeon: Clarene Essex, MD;  Location: [place];  Service: Endoscopy;;  . Azzie Almas DILATION N/A 04/07/2015   Procedure: Azzie Almas DILATION;  Surgeon: Teena Irani, MD;  [facility] ENDOSCOPY;  Service: Endoscopy;  Laterality: N/A;  . SHUNTOGRAM N/A 09/20/2011   Procedure: Earney Mallet;  Surgeon: Conrad [place], MD;  Location: [facility] CATH LAB;  Service: Cardiovascular;  Laterality: N/A;  . SPHINCTEROTOMY  11/14/2018   Procedure: SPHINCTEROTOMY;  Surgeon: Ronnette Juniper, MD;  Location: [facility] ENDOSCOPY;  Service: Gastroenterology;;  . Joan Mayans  03/31/2019   Procedure: Joan Mayans;  Surgeon: Clarene Essex, MD;  Location: [facility] ENDOSCOPY;  Service: Endoscopy;;  . SPHINCTEROTOMY  05/20/2019   Procedure: Joan Mayans;  Surgeon: Clarene Essex, MD;  [facility] ENDOS[facility];  Service: Endoscopy;;  . SPYGLASS CHOLANGIOSCOPY N/A 05/20/2019   Procedure: HYIFOYDX CHOLANGIOSCOPY;  Surgeon: Clarene Essex, MD;  [facility]OS[facility];  Service: Endoscopy;  Laterality: N/A;  Bess Kinds LITHOTRIPSY N/A 05/20/2019   Procedure: AJOINOMV LITHOTRIPSY;  Surgeon: Clarene Essex, MD;  [facility] ENDOS[facility];  Service: Endoscopy;   Laterality: N/A;  . STENT REMOVAL  05/20/2019   Procedure: STENT REMOVAL;  Surgeon: Clarene Essex, MD;  Location[facility] ENDOSCOPY;  Service: Endoscopy;;  . SVT ABLATION N/A 11/26/2016   Procedure: SVT Ablation;  Surgeon: Will Meredith Leeds, MD;  Location: [place] [facility];  Service: Cardiovascular;  Laterality: N/A;  . TEE WITHOUT CARDIOVERSION N/A 08/25/2018   Procedure: TRANSESOPHAGEAL ECHOCARDIOGRAM (TEE);  Surgeon: Dorothy Spark, MD;  Location: [place];  Service: Cardiovascular;  Laterality: N/A;  . TEE WITHOUT CARDIOVERSION N/A 04/02/2019   Procedure: TRANSESOPHAGEAL ECHOCARDIOGRAM (TEE);  Surgeon: Josue Hector, MD;  Location: [facility] ENDOSCOPY;  Service: Cardiovascular;  Laterality: N/A;  . TONSILLECTOMY    . TOTAL KNEE ARTHROPLASTY Right 08/02/2015   Procedure: TOTAL KNEE ARTHROPLASTY;  Surgeon: Renette Butters, MD;  Location: [place];  Service: Orthopedics;  Laterality: Right;  . VENOGRAM N/A 01/26/2013   Procedure: VENOGRAM;  Surgeon: Angelia Mould, MD;  Location: [facility] CATH LAB;  Service: Cardiovascular;  Laterality: N/A;    Social History   Socioeconomic History  . Marital status: Married    Spouse name: Not on file  . Number of children: 3  . Years of education: 61  .  Highest education level: Not on file  Occupational History  . Occupation: disabled    Employer: DISABLED  . Occupation: formerly Lawyer for [employer].  Social Needs  . Financial resource strain: Not on file  . Food insecurity    Worry: Not on file    Inability: Not on file  . Transportation needs    Medical: Not on file    Non-medical: Not on file  Tobacco Use  . Smoking status: Former Smoker    Packs/day: 1.00    Years: 25.00    Pack years: 25.00    Types: Cigarettes    Quit date: 10/01/2005    Years since quitting: 13.6  . Smokeless tobacco: Former Systems developer    Quit date: 10/01/2005  . Tobacco comment: Quit smoking 2007 Smoked x 25 years 1/2 ppd.  Substance and Sexual Activity  .  Alcohol use: No    Alcohol/week: 0.0 standard drinks  . Drug use: No  . Sexual activity: Never  Lifestyle  . Physical activity    Days per week: 2 days    Minutes per session: 10 min  . Stress: Only a little  Relationships  . Social Herbalist on phone: Three times a week    Gets together: Once a week    Attends religious service: 1 to 4 times per year    Active member of club or organization: No    Attends meetings of clubs or organizations: Never    Relationship status: Married  . Intimate partner violence    Fear of current or ex partner: Not on file    Emotionally abused: Not on file    Physically abused: Not on file    Forced sexual activity: Not on file  Other Topics Concern  . Not on file  Social History Narrative   Lives with wife   Caffeine use: Tea sometimes   No coffee   Left handed     Current Outpatient Medications on File Prior to Visit  Medication Sig Dispense Refill  . acetaminophen (TYLENOL) 325 MG tablet Take 325 mg by mouth every 6 (six) hours as needed for mild pain or headache.     . Alirocumab (PRALUENT) 150 MG/ML SOAJ Inject 1 pen into the skin every 14 (fourteen) days. 6 pen 3  . allopurinol (ZYLOPRIM) 100 MG tablet Take 1 tablet by mouth once daily (Patient taking differently: Take 100 mg by mouth at bedtime. ) 90 tablet 0  . aspirin EC 325 MG tablet Take 1 tablet (325 mg total) by mouth daily. 30 tablet 0  . betamethasone dipropionate (DIPROLENE) 0.05 % cream Apply 1 application topically daily as needed (leg sores).    . citalopram (CELEXA) 20 MG tablet Take 20 mg by mouth at bedtime.    . colestipol (COLESTID) 1 g tablet Take 3 tablets (3 g total) by mouth daily. 90 tablet 11  . doxercalciferol ([name]) 4 MCG/2ML injection Inject 1 mL (2 mcg total) into the vein Every Tuesday,Thursday,and Saturday with dialysis. 2 mL 0  . ezetimibe (ZETIA) 10 MG tablet TAKE ONE TABLET BY MOUTH ONCE DAILY (Patient taking differently: Take 10 mg by mouth  every evening. ) 90 tablet 3  . ferric citrate (AURYXIA) 1 GM 210 MG(Fe) tablet Take 2 tablets (420 mg total) by mouth 3 (three) times daily with meals. (Patient taking differently: Take 630 mg by mouth 3 (three) times daily with meals. ) 60 tablet 0  . gabapentin (NEURONTIN) 300 MG capsule TAKE  1 TO 2 CAPSULES BY MOUTH AT BEDTIME AS NEEDED FOR PAIN AND FOR SLEEP (Patient taking differently: Take 600 mg by mouth at bedtime. ) 180 capsule 0  . hydroxypropyl methylcellulose / hypromellose (ISOPTO TEARS / GONIOVISC) 2.5 % ophthalmic solution Place 1 drop into both eyes 3 (three) times daily as needed for dry eyes. 15 mL 11  . loperamide (IMODIUM) 2 MG capsule Take 2 mg by mouth 4 (four) times daily as needed for diarrhea or loose stools.    . methimazole (TAPAZOLE) 5 MG tablet Take 0.5 tablets (2.5 mg total) by mouth daily. (Patient taking differently: Take 2.5 mg by mouth at bedtime. ) 45 tablet 3  . midodrine (PROAMATINE) 10 MG tablet Take 1 tablet (10 mg total) by mouth 3 (three) times daily with meals. (Patient taking differently: Take 10 mg by mouth Every Tuesday,Thursday,and Saturday with dialysis. ) 90 tablet 0  . multivitamin (RENA-VIT) TABS tablet Take 1 tablet by mouth daily.     . pantoprazole (PROTONIX) 40 MG tablet Take 40 mg by mouth at bedtime.     . polyethylene glycol (MIRALAX / GLYCOLAX) 17 g packet Take 17 g by mouth daily as needed for mild constipation.    . SENSIPAR 90 MG tablet Take 90 mg by mouth 2 (two) times daily.    . traMADol (ULTRAM) 50 MG tablet Take 1 tablet (50 mg total) by mouth every 6 (six) hours as needed. (Patient taking differently: Take 50 mg by mouth every 6 (six) hours as needed (pain). ) 14 tablet 0  . zolpidem (AMBIEN) 5 MG tablet Take 1 tablet (5 mg total) by mouth at bedtime as needed. for sleep (Patient taking differently: Take 5 mg by mouth at bedtime. ) 90 tablet 1   No current facility-administered medications on file prior to visit.     Allergies   Allergen Reactions  . Statins Other (See Comments)    Weak muscles  . Ciprofloxacin Rash    Family History  Problem Relation Age of Onset  . Heart disease Sister   . Thyroid nodules Sister   . Heart disease Father   . Diabetes Father   . Kidney failure Father   . Hypertension Father   . Healthy Child   . Healthy Child   . Healthy Child   . Cancer Neg Hx     BP (!) 142/72 (BP Location: Left Arm, Patient Position: Sitting, Cuff Size: Large)   Pulse (!) 108   Ht 6' (1.829 m)   Wt 227 lb (103 kg)   SpO2 90%   BMI 30.79 kg/m    Review of Systems Denies fever.      Objective:   Physical Exam VITAL SIGNS:  See vs page GENERAL: no distress Pulses: dorsalis pedis intact bilat.   MSK: no deformity of the feet CV: trace bilat leg edema Skin:  Few mm shallow ulcer at the dorsal aspect of the foot.  normal color and temp on the feet.   Neuro: sensation is intact to touch on the feet.   Ext: there is bilateral onychomycosis of the toenails.  Left middle finger, flexor aspect: 2 cm healing shallow laceration.  Lab Results  Component Value Date   HGBA1C 6.0 (A) 06/01/2019   Lab Results  Component Value Date   CREATININE 7.71 (HH) 04/08/2019   BUN 39 (H) 04/08/2019   NA 139 05/20/2019   K 4.6 05/20/2019   CL 95 (L) 04/08/2019   CO2 29 04/08/2019   Lab  Results  Component Value Date   TSH 1.34 06/01/2019   T4TOTAL 5.6 07/19/2008      Assessment & Plan:  Type 2 DM, with renal failure: well-controlled.  Finger laceration, new Foot ulcer, new Hyperthyroidism: well-controlled.  Please continue the same medication.   Patient Instructions  Blood tests are requested for you today.  We'll let you know about the results.  Please continue the same medications. Keep the foot ulcer, and your left middle finger covered with antibiotic ointment and large bandaids, until they heal.  Please call next week if it is not much better by then. Your blood pressure is high today.   Please have it rechecked at dialysis Please come back for a follow-up appointment in 6 months.

## 2019-06-02 DIAGNOSIS — E119 Type 2 diabetes mellitus without complications: Secondary | ICD-10-CM | POA: Diagnosis not present

## 2019-06-02 DIAGNOSIS — N2581 Secondary hyperparathyroidism of renal origin: Secondary | ICD-10-CM | POA: Diagnosis not present

## 2019-06-02 DIAGNOSIS — D631 Anemia in chronic kidney disease: Secondary | ICD-10-CM | POA: Diagnosis not present

## 2019-06-02 DIAGNOSIS — Z23 Encounter for immunization: Secondary | ICD-10-CM | POA: Diagnosis not present

## 2019-06-02 DIAGNOSIS — E1129 Type 2 diabetes mellitus with other diabetic kidney complication: Secondary | ICD-10-CM | POA: Diagnosis not present

## 2019-06-02 DIAGNOSIS — D509 Iron deficiency anemia, unspecified: Secondary | ICD-10-CM | POA: Diagnosis not present

## 2019-06-02 DIAGNOSIS — Z992 Dependence on renal dialysis: Secondary | ICD-10-CM | POA: Diagnosis not present

## 2019-06-02 DIAGNOSIS — N186 End stage renal disease: Secondary | ICD-10-CM | POA: Diagnosis not present

## 2019-06-02 LAB — TSH: TSH: 1.34 u[IU]/mL (ref 0.35–4.50)

## 2019-06-02 LAB — T4, FREE: Free T4: 0.8 ng/dL (ref 0.60–1.60)

## 2019-06-03 DIAGNOSIS — E1351 Other specified diabetes mellitus with diabetic peripheral angiopathy without gangrene: Secondary | ICD-10-CM | POA: Diagnosis not present

## 2019-06-03 DIAGNOSIS — B351 Tinea unguium: Secondary | ICD-10-CM | POA: Diagnosis not present

## 2019-06-03 DIAGNOSIS — I739 Peripheral vascular disease, unspecified: Secondary | ICD-10-CM | POA: Diagnosis not present

## 2019-06-04 DIAGNOSIS — E119 Type 2 diabetes mellitus without complications: Secondary | ICD-10-CM | POA: Diagnosis not present

## 2019-06-04 DIAGNOSIS — Z992 Dependence on renal dialysis: Secondary | ICD-10-CM | POA: Diagnosis not present

## 2019-06-04 DIAGNOSIS — N186 End stage renal disease: Secondary | ICD-10-CM | POA: Diagnosis not present

## 2019-06-04 DIAGNOSIS — D509 Iron deficiency anemia, unspecified: Secondary | ICD-10-CM | POA: Diagnosis not present

## 2019-06-04 DIAGNOSIS — N2581 Secondary hyperparathyroidism of renal origin: Secondary | ICD-10-CM | POA: Diagnosis not present

## 2019-06-04 DIAGNOSIS — D631 Anemia in chronic kidney disease: Secondary | ICD-10-CM | POA: Diagnosis not present

## 2019-06-06 DIAGNOSIS — D509 Iron deficiency anemia, unspecified: Secondary | ICD-10-CM | POA: Diagnosis not present

## 2019-06-06 DIAGNOSIS — N2581 Secondary hyperparathyroidism of renal origin: Secondary | ICD-10-CM | POA: Diagnosis not present

## 2019-06-06 DIAGNOSIS — N186 End stage renal disease: Secondary | ICD-10-CM | POA: Diagnosis not present

## 2019-06-06 DIAGNOSIS — D631 Anemia in chronic kidney disease: Secondary | ICD-10-CM | POA: Diagnosis not present

## 2019-06-06 DIAGNOSIS — Z992 Dependence on renal dialysis: Secondary | ICD-10-CM | POA: Diagnosis not present

## 2019-06-06 DIAGNOSIS — E119 Type 2 diabetes mellitus without complications: Secondary | ICD-10-CM | POA: Diagnosis not present

## 2019-06-09 DIAGNOSIS — N2581 Secondary hyperparathyroidism of renal origin: Secondary | ICD-10-CM | POA: Diagnosis not present

## 2019-06-09 DIAGNOSIS — E119 Type 2 diabetes mellitus without complications: Secondary | ICD-10-CM | POA: Diagnosis not present

## 2019-06-09 DIAGNOSIS — D631 Anemia in chronic kidney disease: Secondary | ICD-10-CM | POA: Diagnosis not present

## 2019-06-09 DIAGNOSIS — N186 End stage renal disease: Secondary | ICD-10-CM | POA: Diagnosis not present

## 2019-06-09 DIAGNOSIS — D509 Iron deficiency anemia, unspecified: Secondary | ICD-10-CM | POA: Diagnosis not present

## 2019-06-09 DIAGNOSIS — Z992 Dependence on renal dialysis: Secondary | ICD-10-CM | POA: Diagnosis not present

## 2019-06-10 ENCOUNTER — Telehealth: Payer: Self-pay | Admitting: Internal Medicine

## 2019-06-10 ENCOUNTER — Telehealth: Payer: Self-pay

## 2019-06-10 DIAGNOSIS — I5082 Biventricular heart failure: Secondary | ICD-10-CM | POA: Diagnosis not present

## 2019-06-10 DIAGNOSIS — I5042 Chronic combined systolic (congestive) and diastolic (congestive) heart failure: Secondary | ICD-10-CM | POA: Diagnosis not present

## 2019-06-10 DIAGNOSIS — E1122 Type 2 diabetes mellitus with diabetic chronic kidney disease: Secondary | ICD-10-CM | POA: Diagnosis not present

## 2019-06-10 DIAGNOSIS — N186 End stage renal disease: Secondary | ICD-10-CM | POA: Diagnosis not present

## 2019-06-10 DIAGNOSIS — Z48815 Encounter for surgical aftercare following surgery on the digestive system: Secondary | ICD-10-CM | POA: Diagnosis not present

## 2019-06-10 DIAGNOSIS — I132 Hypertensive heart and chronic kidney disease with heart failure and with stage 5 chronic kidney disease, or end stage renal disease: Secondary | ICD-10-CM | POA: Diagnosis not present

## 2019-06-10 IMAGING — DX DG CHEST 1V PORT
1 series · 1 of 1 positions shown · non-contrast
Comparison: 07/16/2018

CLINICAL DATA: Altered mental status

EXAM:
PORTABLE CHEST 1 VIEW

[chest ap]
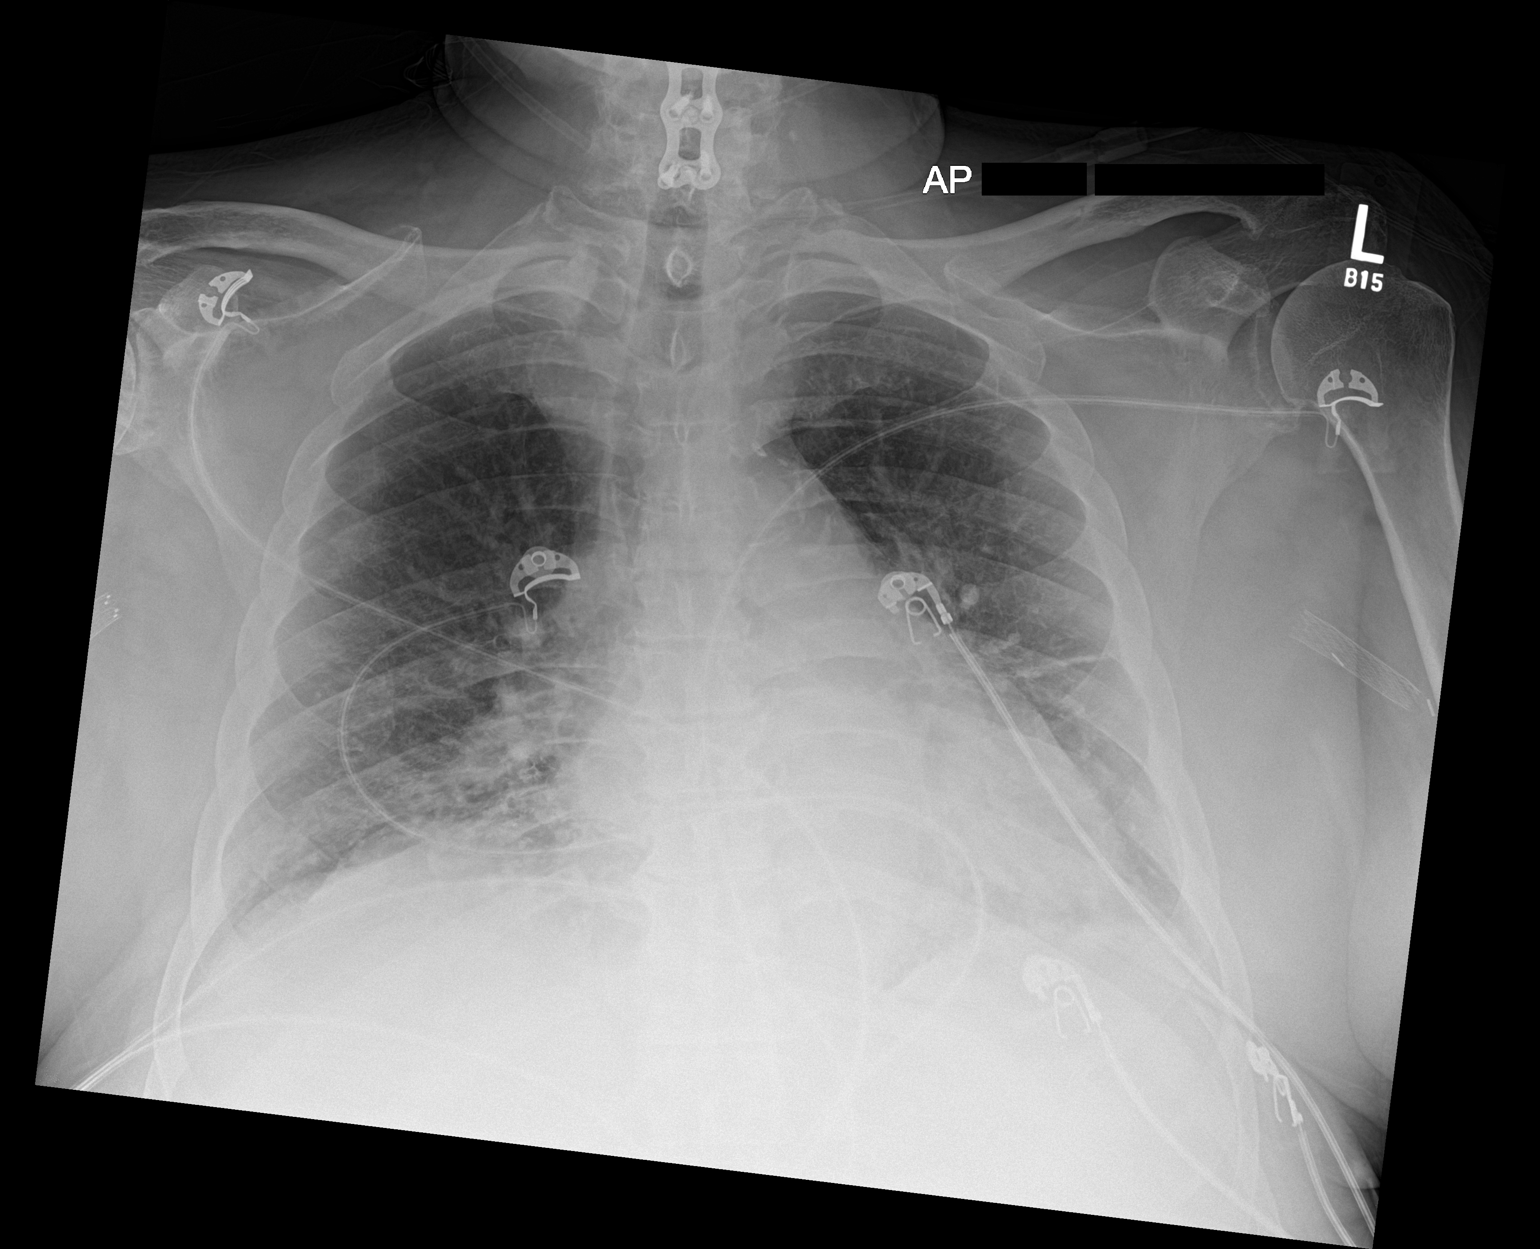

[1 of 1 positions shown; findings below may reference images not displayed]

FINDINGS: Cardiomegaly with vascular congestion. Bibasilar atelectasis. No
visible effusions or acute bony abnormality.
IMPRESSION: Cardiomegaly with vascular congestion and bibasilar atelectasis.

## 2019-06-10 IMAGING — CT CT HEAD W/O CM
3 series · 16 of 47 positions shown, 19 images · non-contrast
Comparison: 07/16/2018

CLINICAL DATA: Altered level of consciousness with fever.

EXAM:
CT HEAD WITHOUT CONTRAST
TECHNIQUE: Contiguous axial images were obtained from the base of the skull
through the vertex without intravenous contrast.

[Series 3: head 5.0 h30s · axial · 0.43mm/px · z∈[-111,+34]mm · 10 of 35 slices shown, 13 images]
[im 3/35  brain]
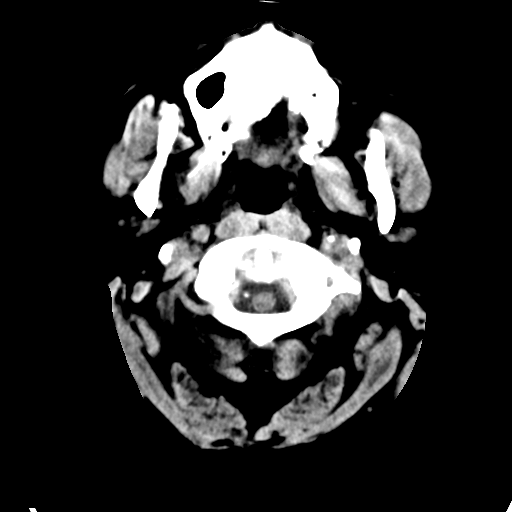
[im 3/35  bone]
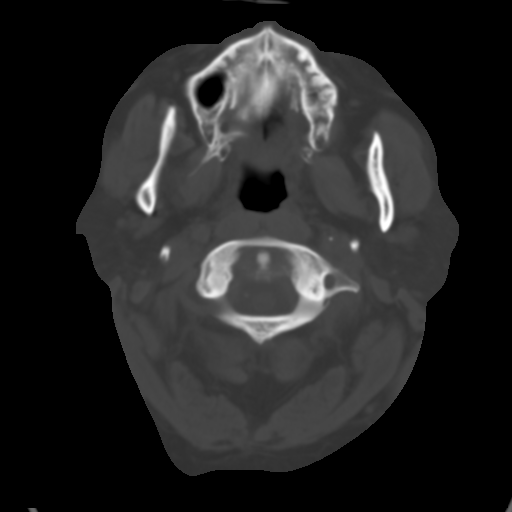
[im 6/35  brain]
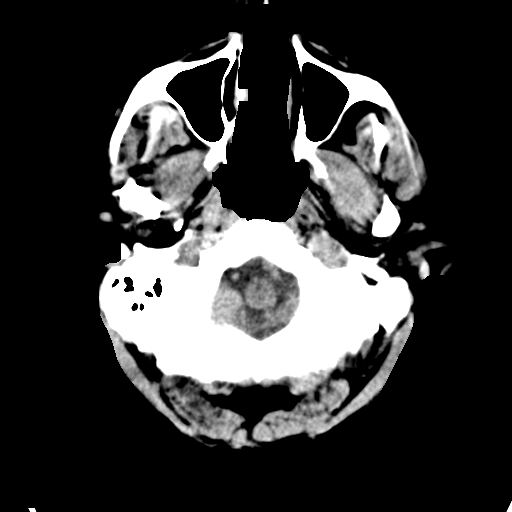
[im 10/35  brain]
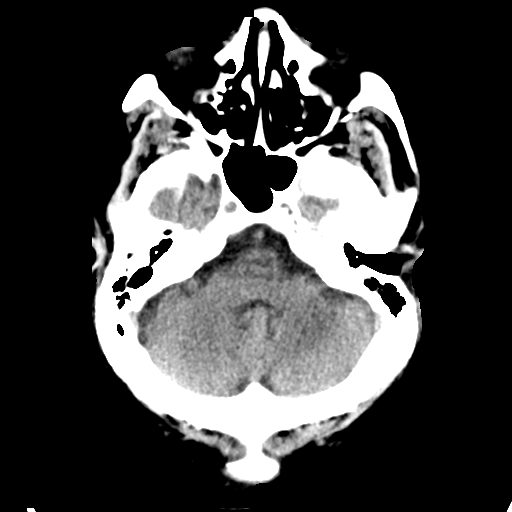
[im 12/35  brain]
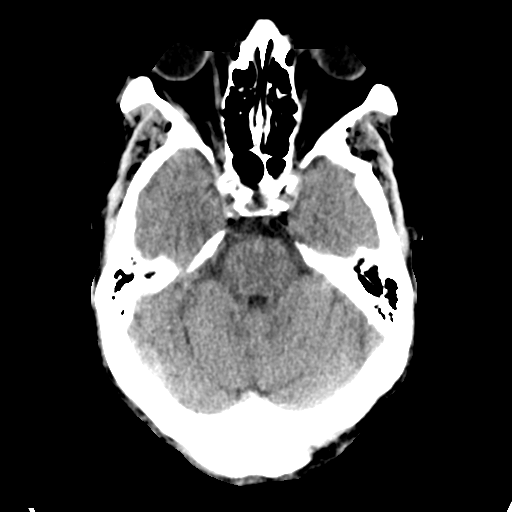
[im 16/35  brain]
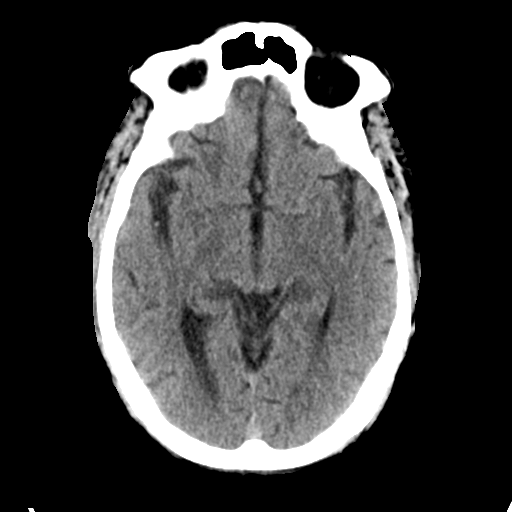
[im 16/35  bone]
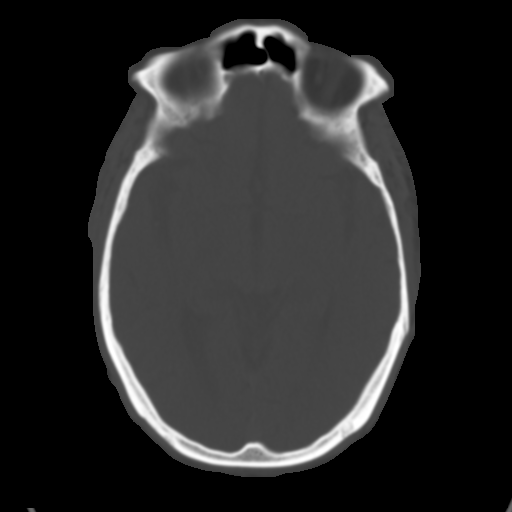
[im 19/35  brain]
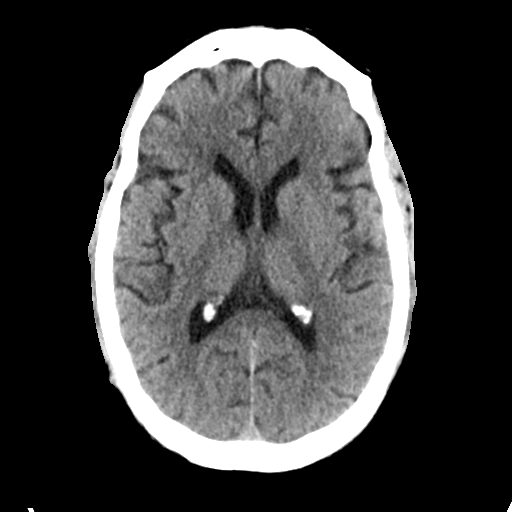
[im 23/35  brain]
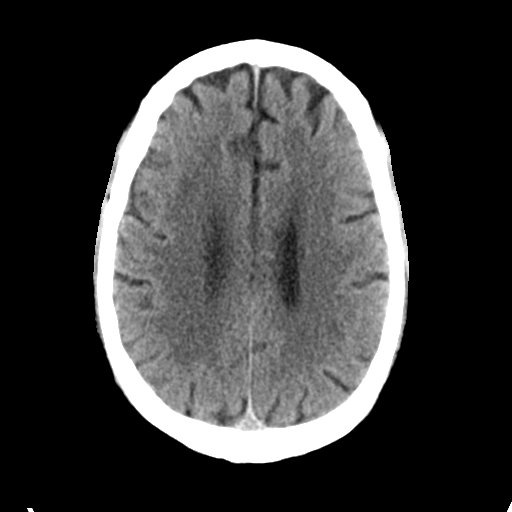
[im 26/35  brain]
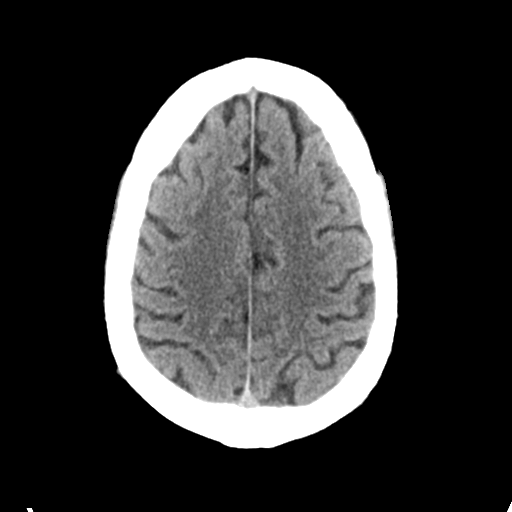
[im 29/35  brain]
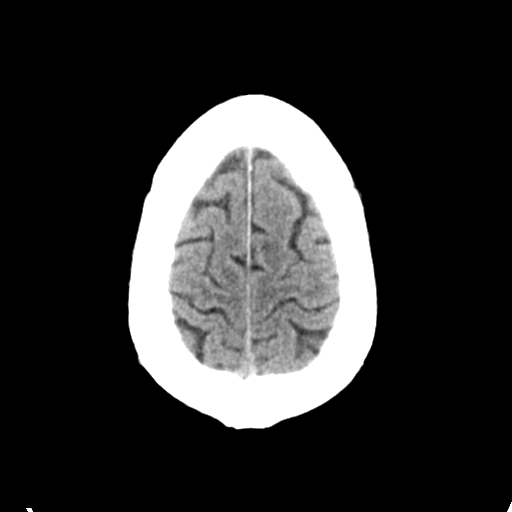
[im 29/35  bone]
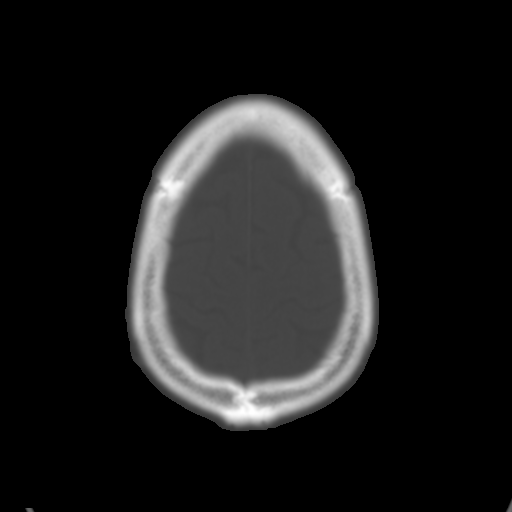
[im 32/35  brain]
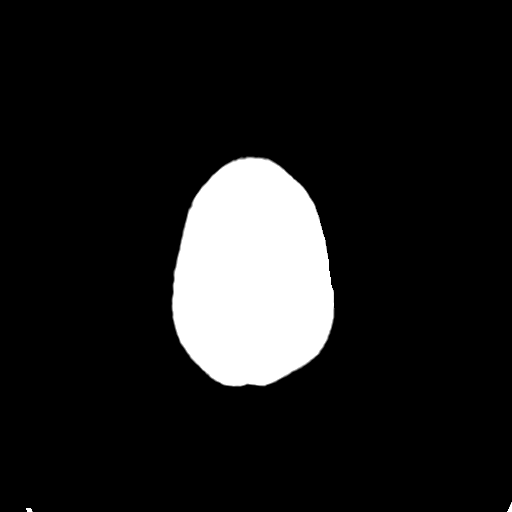

[Series 5: head 3.0 mpr cor · coronal · 0.34mm/px · 3 of 75 slices shown]
[im 25/75  brain]
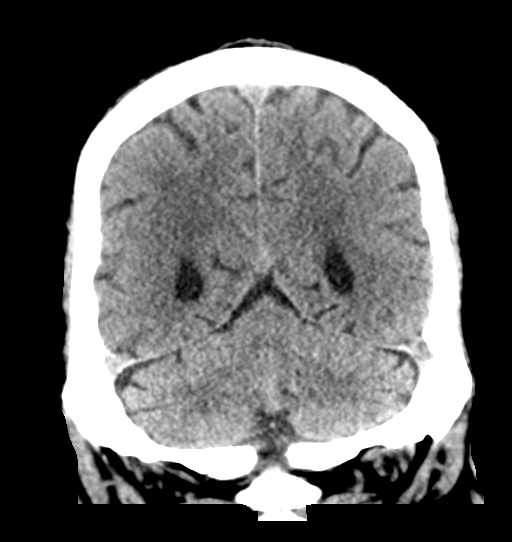
[im 33/75  brain]
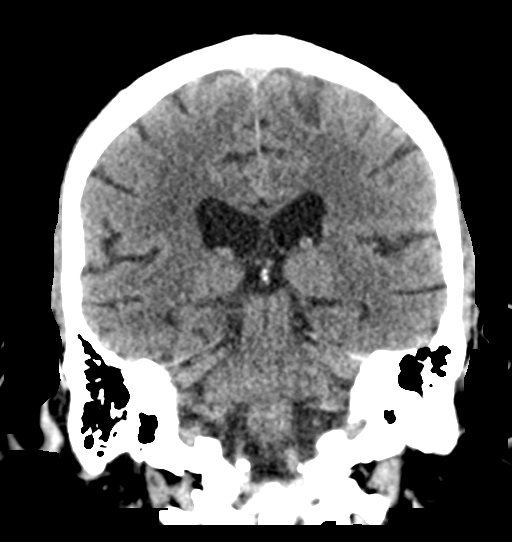
[im 42/75  brain]
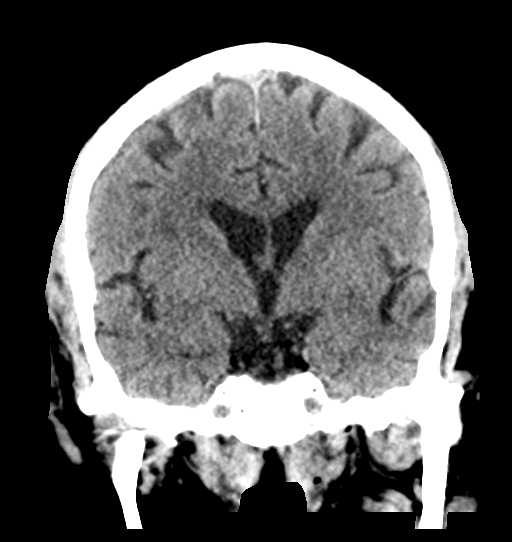

[Series 6: head 3.0 mpr sag · sagittal · 0.35mm/px · 3 of 57 slices shown]
[im 19/57  brain]
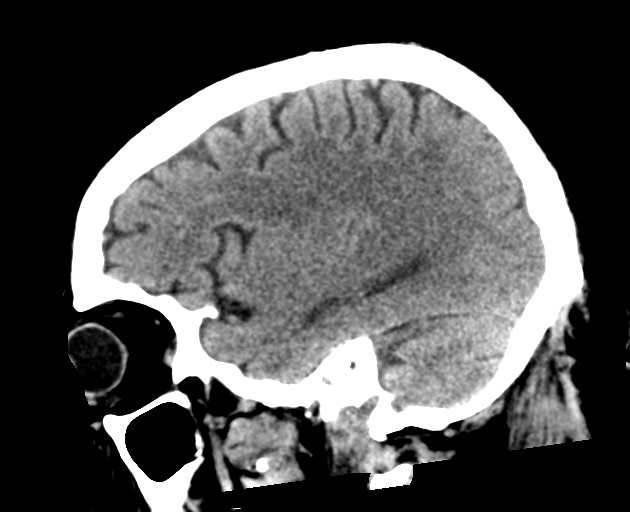
[im 29/57  brain]
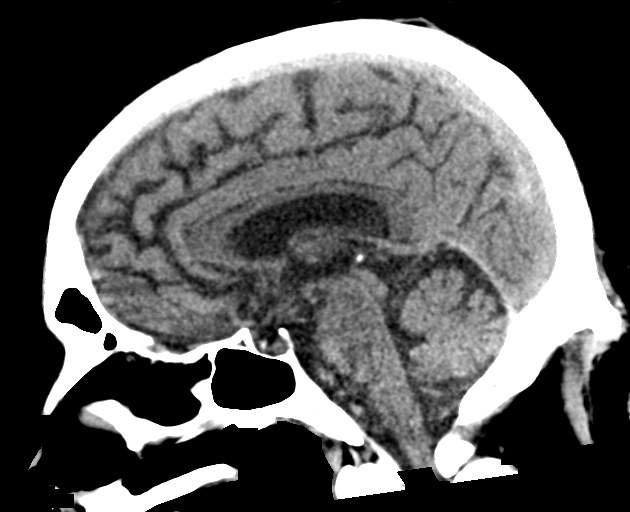
[im 38/57  brain]
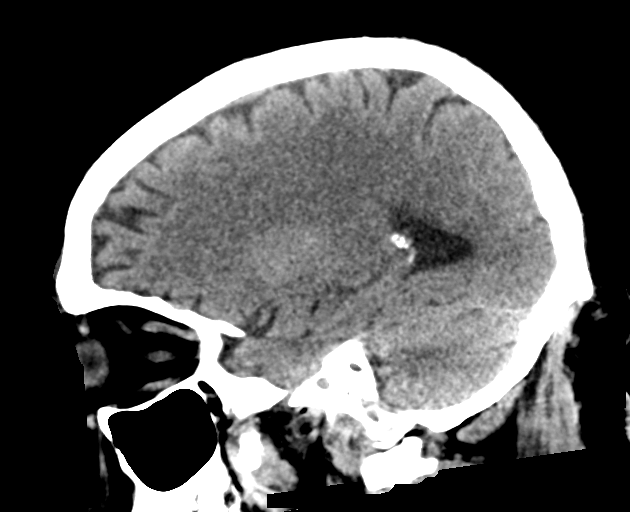

[16 of 47 positions shown; findings below may reference images not displayed]

FINDINGS: Brain: Mild involutional changes of the brain with minimal small
vessel ischemic disease of periventricular white matter. No large
vascular territory infarct, hemorrhage or midline shift. No
hydrocephalus. Midline fourth ventricle and basal cisterns without
effacement. Brainstem and cerebellum appear nonacute.

Vascular: No hyperdense vessel signs. Atherosclerosis of the carotid
siphons bilaterally.

Skull: Intact

Sinuses/Orbits: No acute finding.

Other: None.
IMPRESSION: Minimal small vessel ischemic disease of periventricular white
matter. No acute intracranial abnormality.

## 2019-06-10 NOTE — Telephone Encounter (Signed)
Verbal orders already given    Copied from [place] [id number]. Topic: General - Other >> Jun 10, 2019  3:43 PM [place] Quan A wrote: Reason for CRM: Gennaro Africa with [facility] called to report  to Dr [name] that patient had a fall on 06/09/2019 while leaving dialysis. He is complaining of some pain in the left knee with weight bearing. Would also like to get additional orders for pt. Ph#  [phone]

## 2019-06-10 NOTE — Telephone Encounter (Signed)
Copied from [place] [id number]. Topic: Quick Communication - Home Health Verbal Orders >> Jun 10, 2019  3:45 PM [place] Quan A wrote: Caller/Agency: Angela Adam  / [facility] Number: [phone] ok to LM Requesting OT/PT/Skilled Nursing/Social Work/Speech Therapy: PT  Frequency: 2 w 6, 1 w 3

## 2019-06-10 NOTE — Telephone Encounter (Signed)
LVM verbal orders

## 2019-06-11 DIAGNOSIS — N186 End stage renal disease: Secondary | ICD-10-CM | POA: Diagnosis not present

## 2019-06-11 DIAGNOSIS — E119 Type 2 diabetes mellitus without complications: Secondary | ICD-10-CM | POA: Diagnosis not present

## 2019-06-11 DIAGNOSIS — D631 Anemia in chronic kidney disease: Secondary | ICD-10-CM | POA: Diagnosis not present

## 2019-06-11 DIAGNOSIS — Z992 Dependence on renal dialysis: Secondary | ICD-10-CM | POA: Diagnosis not present

## 2019-06-11 DIAGNOSIS — N2581 Secondary hyperparathyroidism of renal origin: Secondary | ICD-10-CM | POA: Diagnosis not present

## 2019-06-11 DIAGNOSIS — D509 Iron deficiency anemia, unspecified: Secondary | ICD-10-CM | POA: Diagnosis not present

## 2019-06-11 IMAGING — CT CT ABD-PELV W/O CM
2 of 4 series · 16 of 46 positions shown, 18 images · non-contrast
Comparison: CT abdomen pelvis of 07/16/2018

CLINICAL DATA: Abdominal pain, some nausea and vomiting, poor
historian

EXAM:
CT ABDOMEN AND PELVIS WITHOUT CONTRAST
TECHNIQUE: Multidetector CT imaging of the abdomen and pelvis was performed
following the standard protocol without IV contrast.

[Series 3: abd/ pelvis 5.0 i30f 2 · axial · 0.84mm/px · z∈[-1127,-722]mm · 13 of 89 slices shown, 15 images]
[im 4/89  soft-tissue]
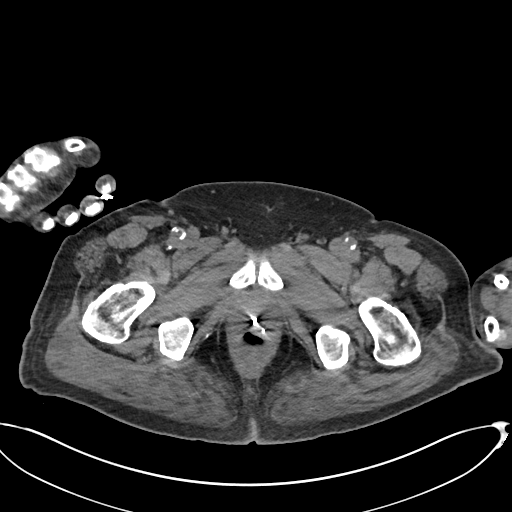
[im 4/89  bone]
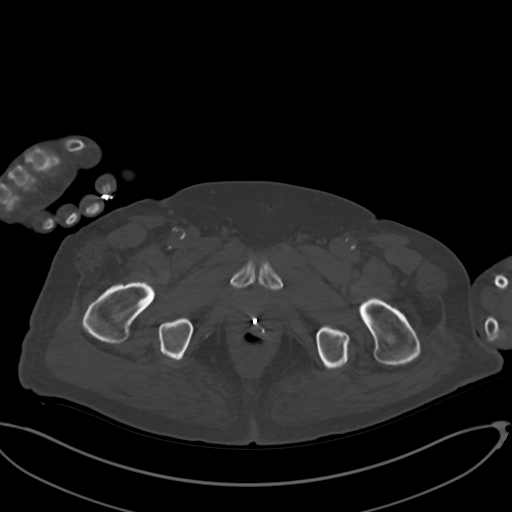
[im 12/89  soft-tissue]
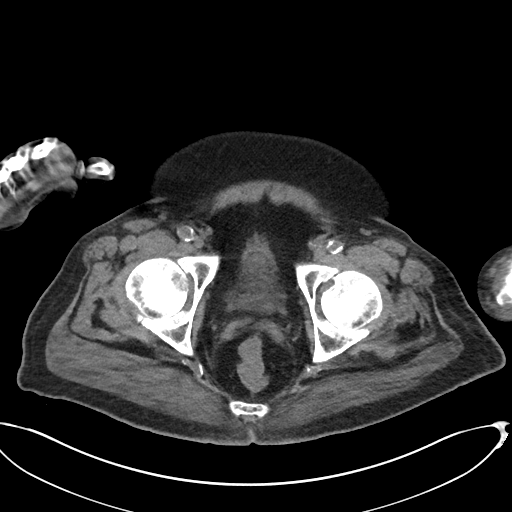
[im 20/89  soft-tissue]
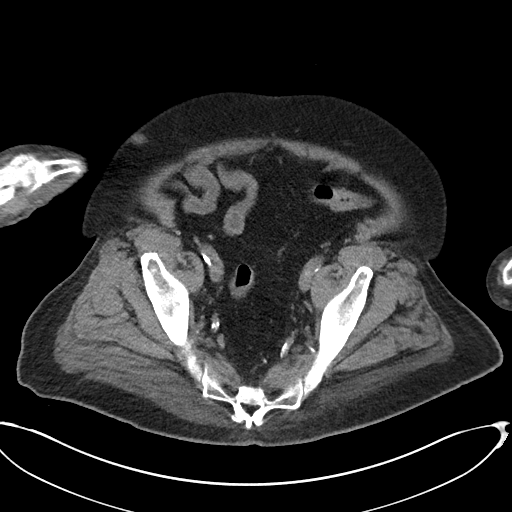
[im 23/89  soft-tissue]
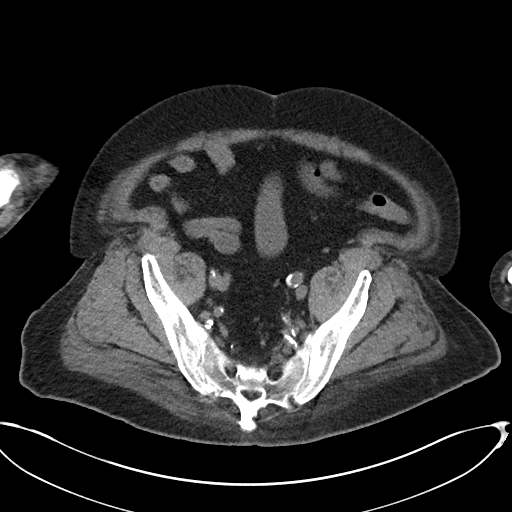
[im 31/89  soft-tissue]
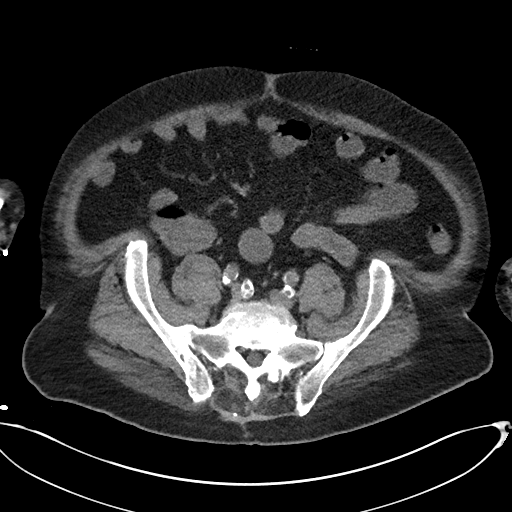
[im 39/89  soft-tissue]
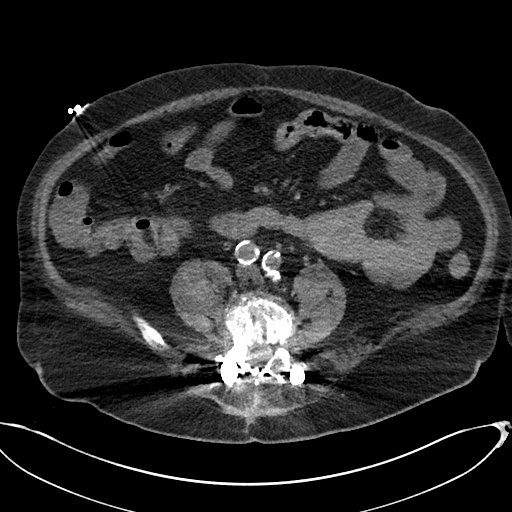
[im 46/89  soft-tissue]
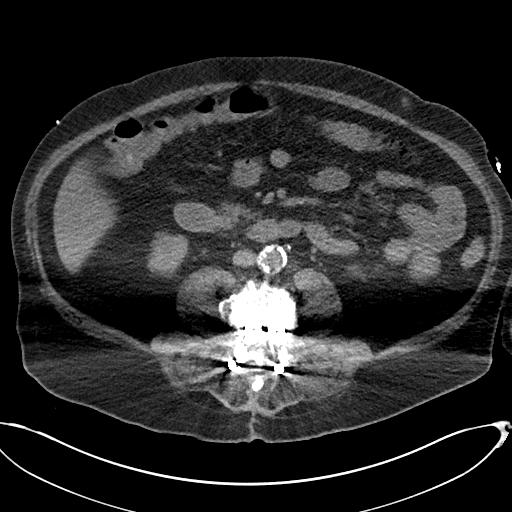
[im 50/89  soft-tissue]
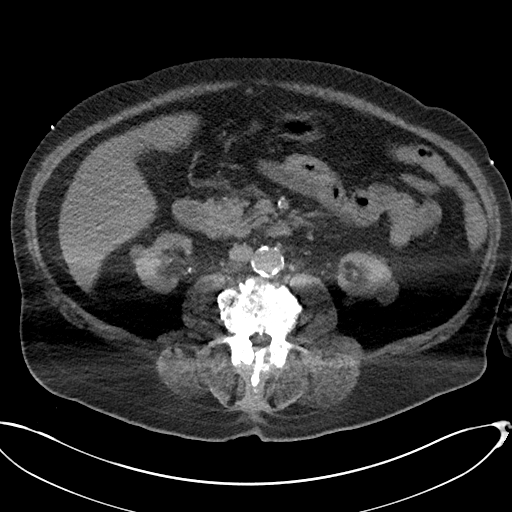
[im 58/89  soft-tissue]
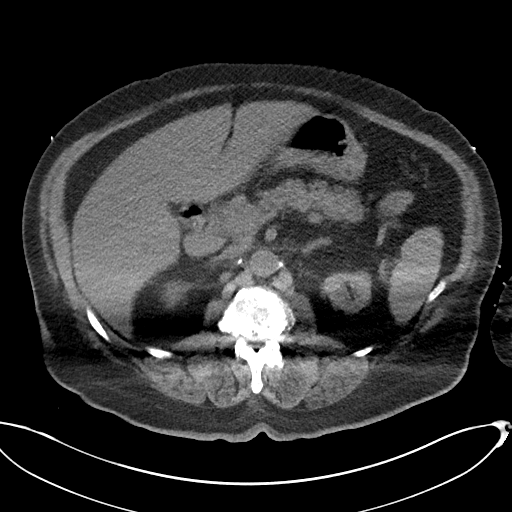
[im 58/89  bone]
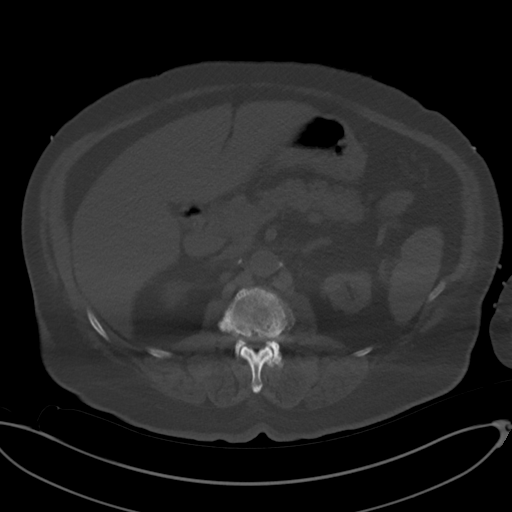
[im 66/89  soft-tissue]
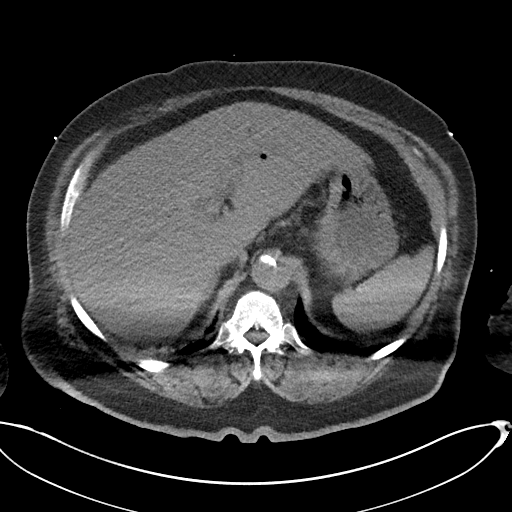
[im 69/89  soft-tissue]
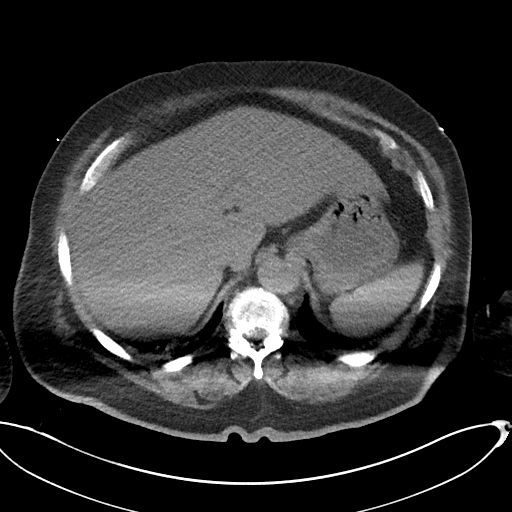
[im 77/89  soft-tissue]
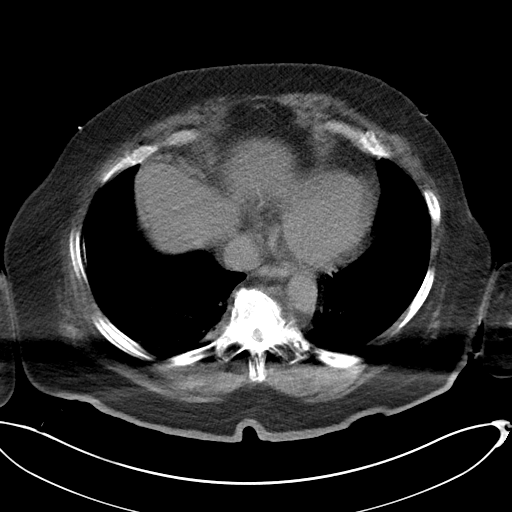
[im 85/89  soft-tissue]
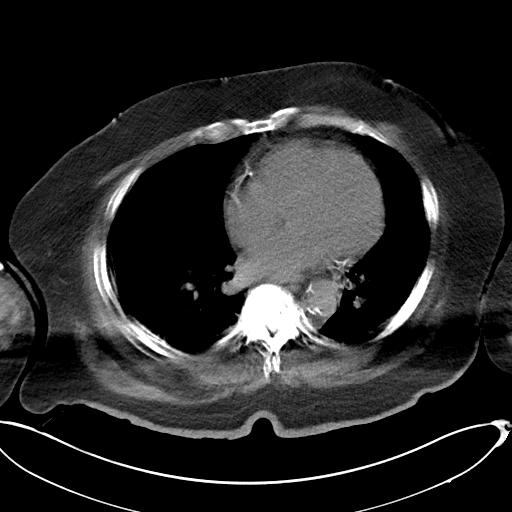

[Series 6: cor st · coronal · 0.84mm/px · 3 of 122 slices shown]
[im 41/122  soft-tissue]
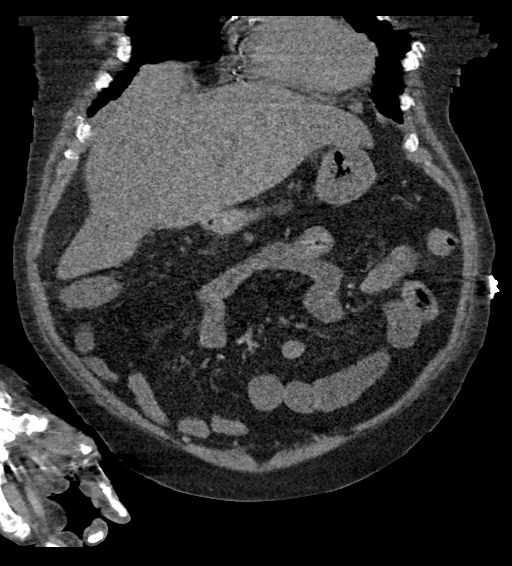
[im 54/122  soft-tissue]
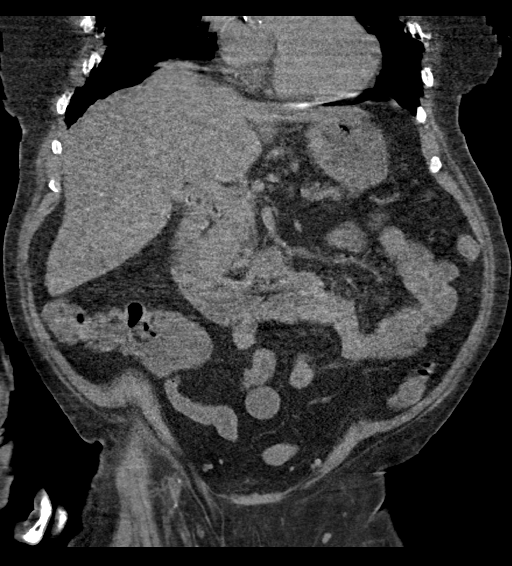
[im 68/122  soft-tissue]
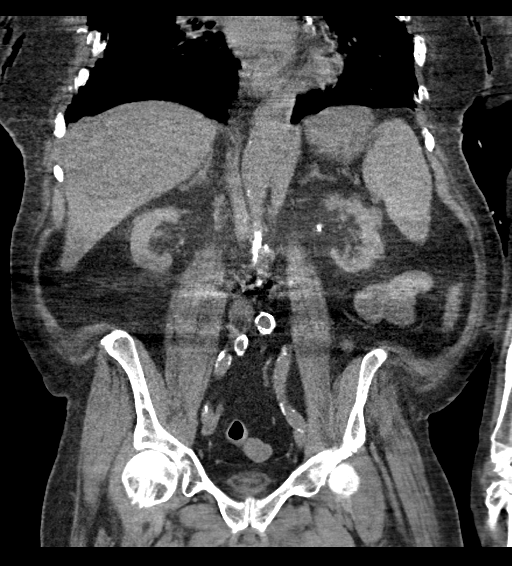

[16 of 46 positions shown; findings below may reference images not displayed]

FINDINGS: Lower chest: There is considerable motion through the lung bases
obscuring detail. No obvious pneumonia or pleural effusion is
evident. There is mild cardiomegaly which appears stable.

Hepatobiliary: The liver is unremarkable in the unenhanced state.
Some air is noted within the ductal system most likely due to prior
cholecystectomy and probable sphincterotomy.

Pancreas: The pancreas is normal in size and the pancreatic duct is
not dilated.

Spleen: The spleen is unremarkable.

Adrenals/Urinary Tract: The adrenal glands appear stable and normal.
The kidneys are small and there are multiple low-attenuation
structures on this unenhanced study most consistent with renal
cysts. Also there is renovascular calcification and probable small
renal calculi bilaterally. No hydronephrosis is evident currently.
The ureters appear normal in caliber. The urinary bladder is
decompressed completely and cannot be evaluated.

Stomach/Bowel: The stomach is somewhat decompressed. No abnormality
is evident. No small bowel dilatation or edema is seen. The colon is
largely decompressed and there are scattered diverticula present. No
definite diverticulitis is seen. The terminal ileum is unremarkable.
The appendix is better seen on the coronal images and is normal. No
inflammatory process is seen.

Vascular/Lymphatic: The abdominal aorta is normal in caliber with
significant abdominal aortic atherosclerosis present. No adenopathy
is seen.

Reproductive: The prostate is small and there are surgical clips
posterior to the prostate.

Other: No abdominal wall hernia is noted.

Musculoskeletal: Hardware for fusion is noted at L4-5 with interbody
ray cage fusion at L3-4. There are significant degenerative changes
throughout the lumbar spine.
IMPRESSION: 1. No definite explanation for the patient's abdominal pain is seen.
There are scattered colonic diverticula present but no
diverticulitis is noted.
2. The appendix and terminal ileum are unremarkable.
3. Significant abdominal aortic atherosclerosis.
4. Probable small renal cysts, difficult to assess on this
unenhanced study, with renovascular calcifications and probable
small nonobstructing renal calculi.

## 2019-06-11 IMAGING — DX DG CHEST 1V PORT
1 series · 1 of 1 positions shown · non-contrast
Comparison: 08/19/2018

CLINICAL DATA: Attempted right internal jugular hemodialysis
catheter.

EXAM:
PORTABLE CHEST 1 VIEW

[chest ap]
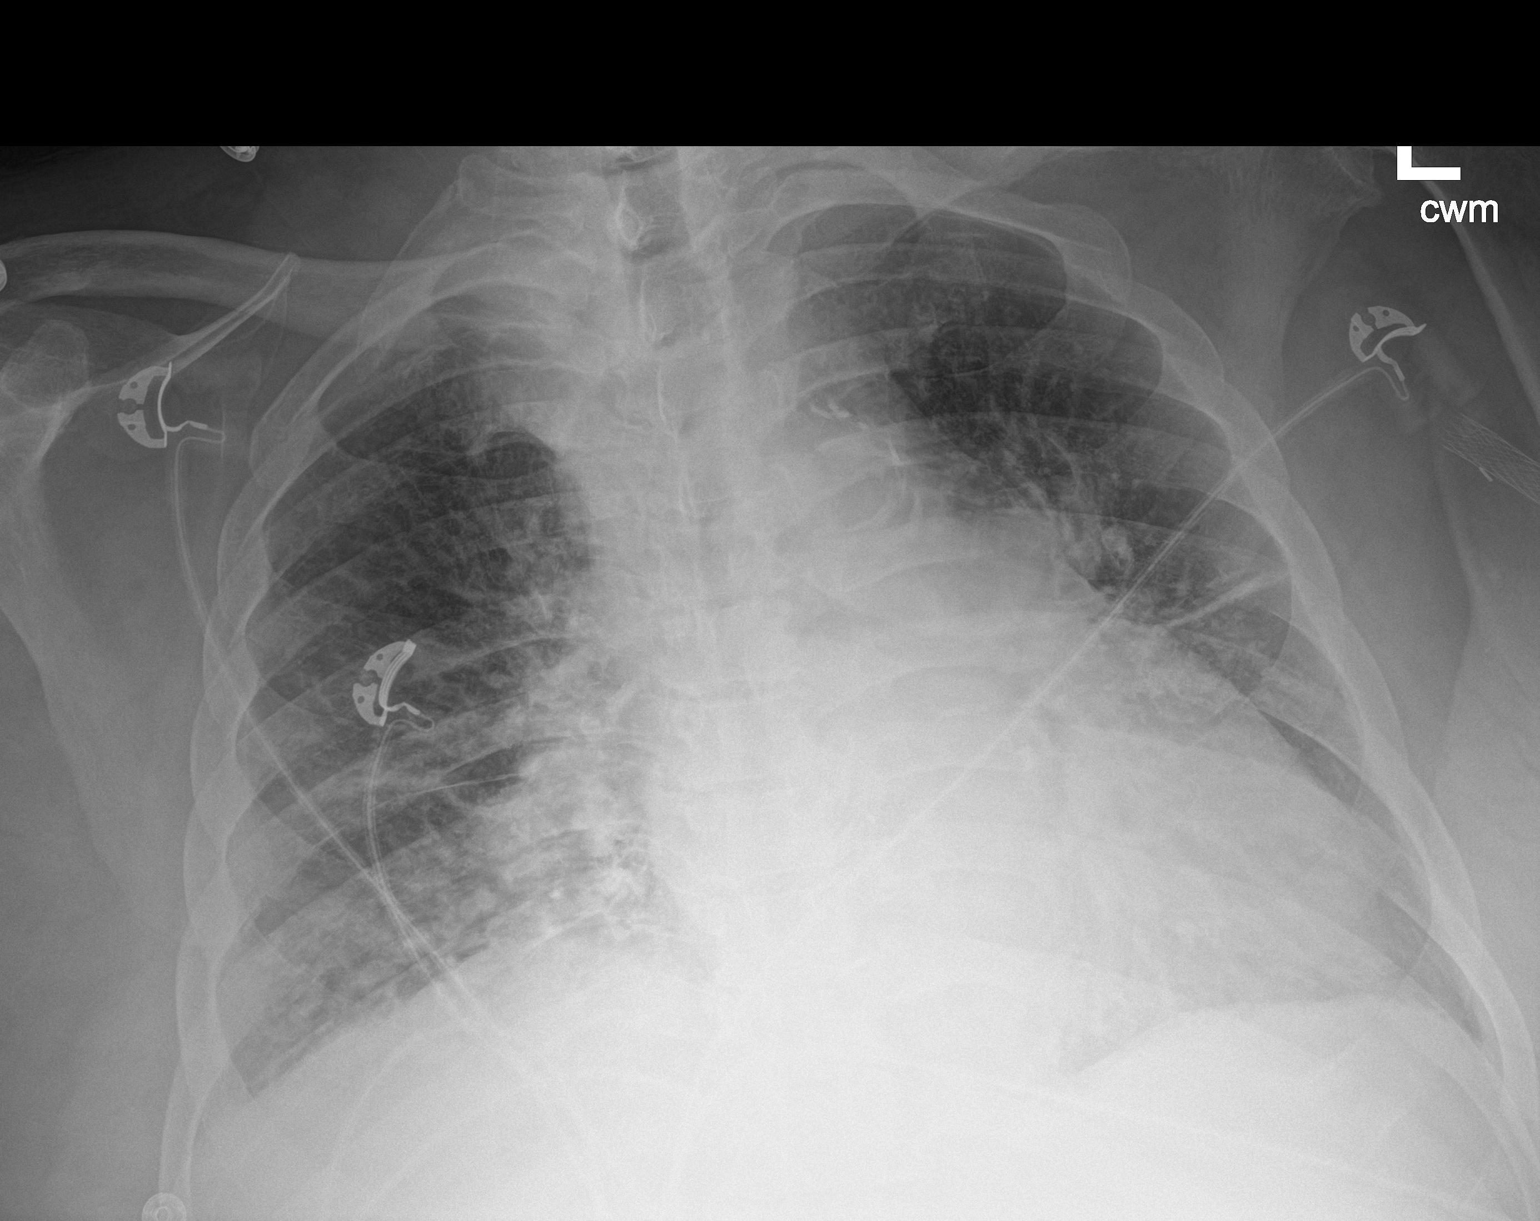

[1 of 1 positions shown; findings below may reference images not displayed]

FINDINGS: Shallow inspiration. Cardiac enlargement. Central vascular
congestion with mild perihilar infiltration likely due to edema.
Linear atelectasis or fibrosis in the mid lungs. No blunting of
costophrenic angles. No pneumothorax. Mediastinal contours appear
intact. Calcification of the aorta. Vascular graft in the left
axilla. Postoperative changes in the cervical spine.
IMPRESSION: Cardiac enlargement with mild pulmonary vascular congestion and
perihilar edema. No pneumothorax.

## 2019-06-12 ENCOUNTER — Other Ambulatory Visit: Payer: Self-pay | Admitting: Internal Medicine

## 2019-06-12 DIAGNOSIS — I5042 Chronic combined systolic (congestive) and diastolic (congestive) heart failure: Secondary | ICD-10-CM | POA: Diagnosis not present

## 2019-06-12 DIAGNOSIS — Z48815 Encounter for surgical aftercare following surgery on the digestive system: Secondary | ICD-10-CM | POA: Diagnosis not present

## 2019-06-12 DIAGNOSIS — E1122 Type 2 diabetes mellitus with diabetic chronic kidney disease: Secondary | ICD-10-CM | POA: Diagnosis not present

## 2019-06-12 DIAGNOSIS — N186 End stage renal disease: Secondary | ICD-10-CM | POA: Diagnosis not present

## 2019-06-12 DIAGNOSIS — I5082 Biventricular heart failure: Secondary | ICD-10-CM | POA: Diagnosis not present

## 2019-06-12 DIAGNOSIS — I132 Hypertensive heart and chronic kidney disease with heart failure and with stage 5 chronic kidney disease, or end stage renal disease: Secondary | ICD-10-CM | POA: Diagnosis not present

## 2019-06-13 DIAGNOSIS — D631 Anemia in chronic kidney disease: Secondary | ICD-10-CM | POA: Diagnosis not present

## 2019-06-13 DIAGNOSIS — M5412 Radiculopathy, cervical region: Secondary | ICD-10-CM | POA: Diagnosis not present

## 2019-06-13 DIAGNOSIS — Z86010 Personal history of colonic polyps: Secondary | ICD-10-CM | POA: Diagnosis not present

## 2019-06-13 DIAGNOSIS — I5082 Biventricular heart failure: Secondary | ICD-10-CM | POA: Diagnosis not present

## 2019-06-13 DIAGNOSIS — E1151 Type 2 diabetes mellitus with diabetic peripheral angiopathy without gangrene: Secondary | ICD-10-CM | POA: Diagnosis not present

## 2019-06-13 DIAGNOSIS — N2581 Secondary hyperparathyroidism of renal origin: Secondary | ICD-10-CM | POA: Diagnosis not present

## 2019-06-13 DIAGNOSIS — M109 Gout, unspecified: Secondary | ICD-10-CM | POA: Diagnosis not present

## 2019-06-13 DIAGNOSIS — Z87891 Personal history of nicotine dependence: Secondary | ICD-10-CM | POA: Diagnosis not present

## 2019-06-13 DIAGNOSIS — F0280 Dementia in other diseases classified elsewhere without behavioral disturbance: Secondary | ICD-10-CM | POA: Diagnosis not present

## 2019-06-13 DIAGNOSIS — E039 Hypothyroidism, unspecified: Secondary | ICD-10-CM | POA: Diagnosis not present

## 2019-06-13 DIAGNOSIS — F329 Major depressive disorder, single episode, unspecified: Secondary | ICD-10-CM | POA: Diagnosis not present

## 2019-06-13 DIAGNOSIS — J449 Chronic obstructive pulmonary disease, unspecified: Secondary | ICD-10-CM | POA: Diagnosis not present

## 2019-06-13 DIAGNOSIS — I252 Old myocardial infarction: Secondary | ICD-10-CM | POA: Diagnosis not present

## 2019-06-13 DIAGNOSIS — N186 End stage renal disease: Secondary | ICD-10-CM | POA: Diagnosis not present

## 2019-06-13 DIAGNOSIS — I132 Hypertensive heart and chronic kidney disease with heart failure and with stage 5 chronic kidney disease, or end stage renal disease: Secondary | ICD-10-CM | POA: Diagnosis not present

## 2019-06-13 DIAGNOSIS — D509 Iron deficiency anemia, unspecified: Secondary | ICD-10-CM | POA: Diagnosis not present

## 2019-06-13 DIAGNOSIS — Z8673 Personal history of transient ischemic attack (TIA), and cerebral infarction without residual deficits: Secondary | ICD-10-CM | POA: Diagnosis not present

## 2019-06-13 DIAGNOSIS — I5042 Chronic combined systolic (congestive) and diastolic (congestive) heart failure: Secondary | ICD-10-CM | POA: Diagnosis not present

## 2019-06-13 DIAGNOSIS — A419 Sepsis, unspecified organism: Secondary | ICD-10-CM | POA: Diagnosis not present

## 2019-06-13 DIAGNOSIS — I2510 Atherosclerotic heart disease of native coronary artery without angina pectoris: Secondary | ICD-10-CM | POA: Diagnosis not present

## 2019-06-13 DIAGNOSIS — Z8546 Personal history of malignant neoplasm of prostate: Secondary | ICD-10-CM | POA: Diagnosis not present

## 2019-06-13 DIAGNOSIS — E119 Type 2 diabetes mellitus without complications: Secondary | ICD-10-CM | POA: Diagnosis not present

## 2019-06-13 DIAGNOSIS — E1122 Type 2 diabetes mellitus with diabetic chronic kidney disease: Secondary | ICD-10-CM | POA: Diagnosis not present

## 2019-06-13 DIAGNOSIS — M48062 Spinal stenosis, lumbar region with neurogenic claudication: Secondary | ICD-10-CM | POA: Diagnosis not present

## 2019-06-13 DIAGNOSIS — Z992 Dependence on renal dialysis: Secondary | ICD-10-CM | POA: Diagnosis not present

## 2019-06-13 DIAGNOSIS — Z452 Encounter for adjustment and management of vascular access device: Secondary | ICD-10-CM | POA: Diagnosis not present

## 2019-06-13 DIAGNOSIS — I255 Ischemic cardiomyopathy: Secondary | ICD-10-CM | POA: Diagnosis not present

## 2019-06-13 DIAGNOSIS — E1142 Type 2 diabetes mellitus with diabetic polyneuropathy: Secondary | ICD-10-CM | POA: Diagnosis not present

## 2019-06-13 DIAGNOSIS — Z9861 Coronary angioplasty status: Secondary | ICD-10-CM | POA: Diagnosis not present

## 2019-06-13 IMAGING — DX DG CHEST 1V PORT
1 series · 1 of 1 positions shown · non-contrast
Comparison: August 20, 2018

CLINICAL DATA: Respiratory failure

EXAM:
PORTABLE CHEST 1 VIEW

[chest ap]
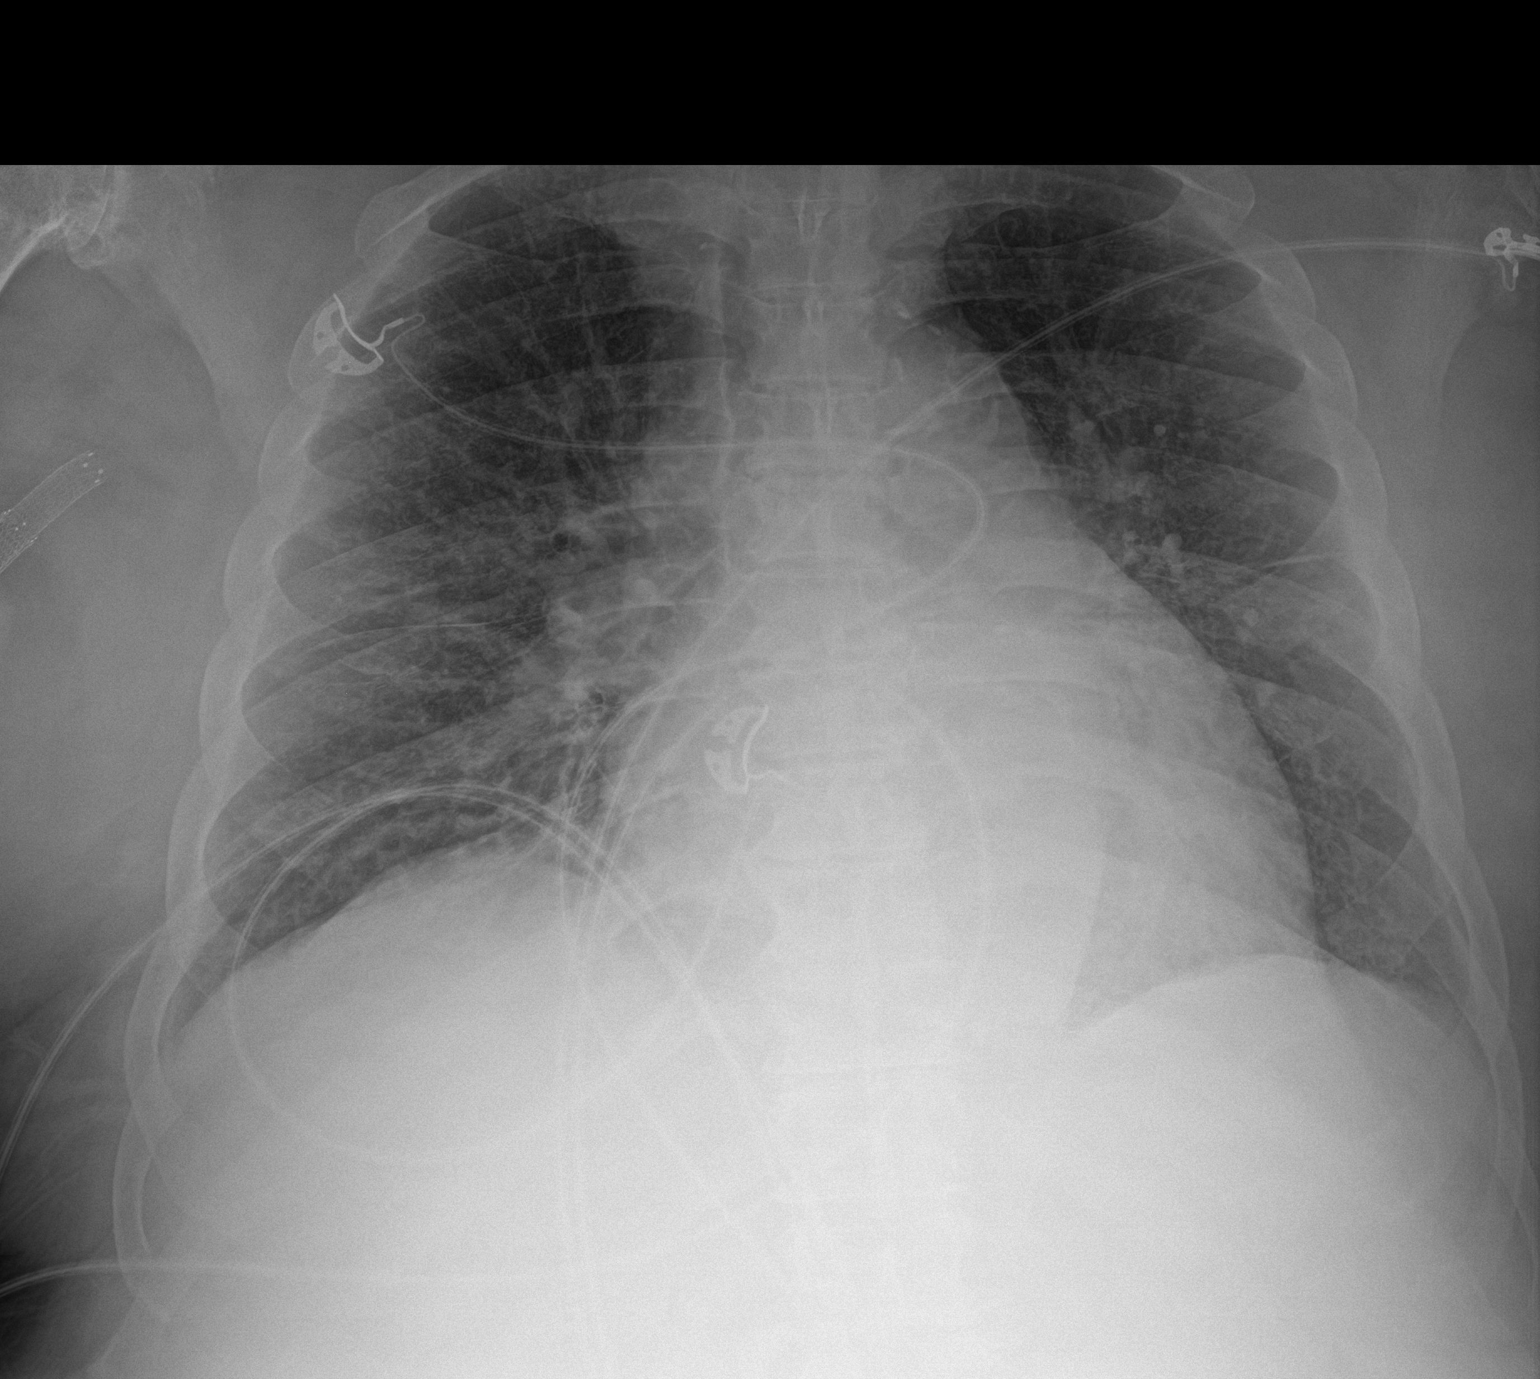

[1 of 1 positions shown; findings below may reference images not displayed]

FINDINGS: There is cardiomegaly with pulmonary venous hypertension. There is
no appreciable edema or consolidation. No adenopathy. There is
aortic atherosclerosis. No bone lesions.
IMPRESSION: Pulmonary vascular congestion without frank edema or consolidation.
There is aortic atherosclerosis.

Aortic Atherosclerosis ([id number]).

## 2019-06-14 IMAGING — DX DG CHEST 1V PORT
1 series · 1 of 1 positions shown · non-contrast
Comparison: 08/22/2018

CLINICAL DATA: Respiratory failure

EXAM:
PORTABLE CHEST 1 VIEW

[chest]
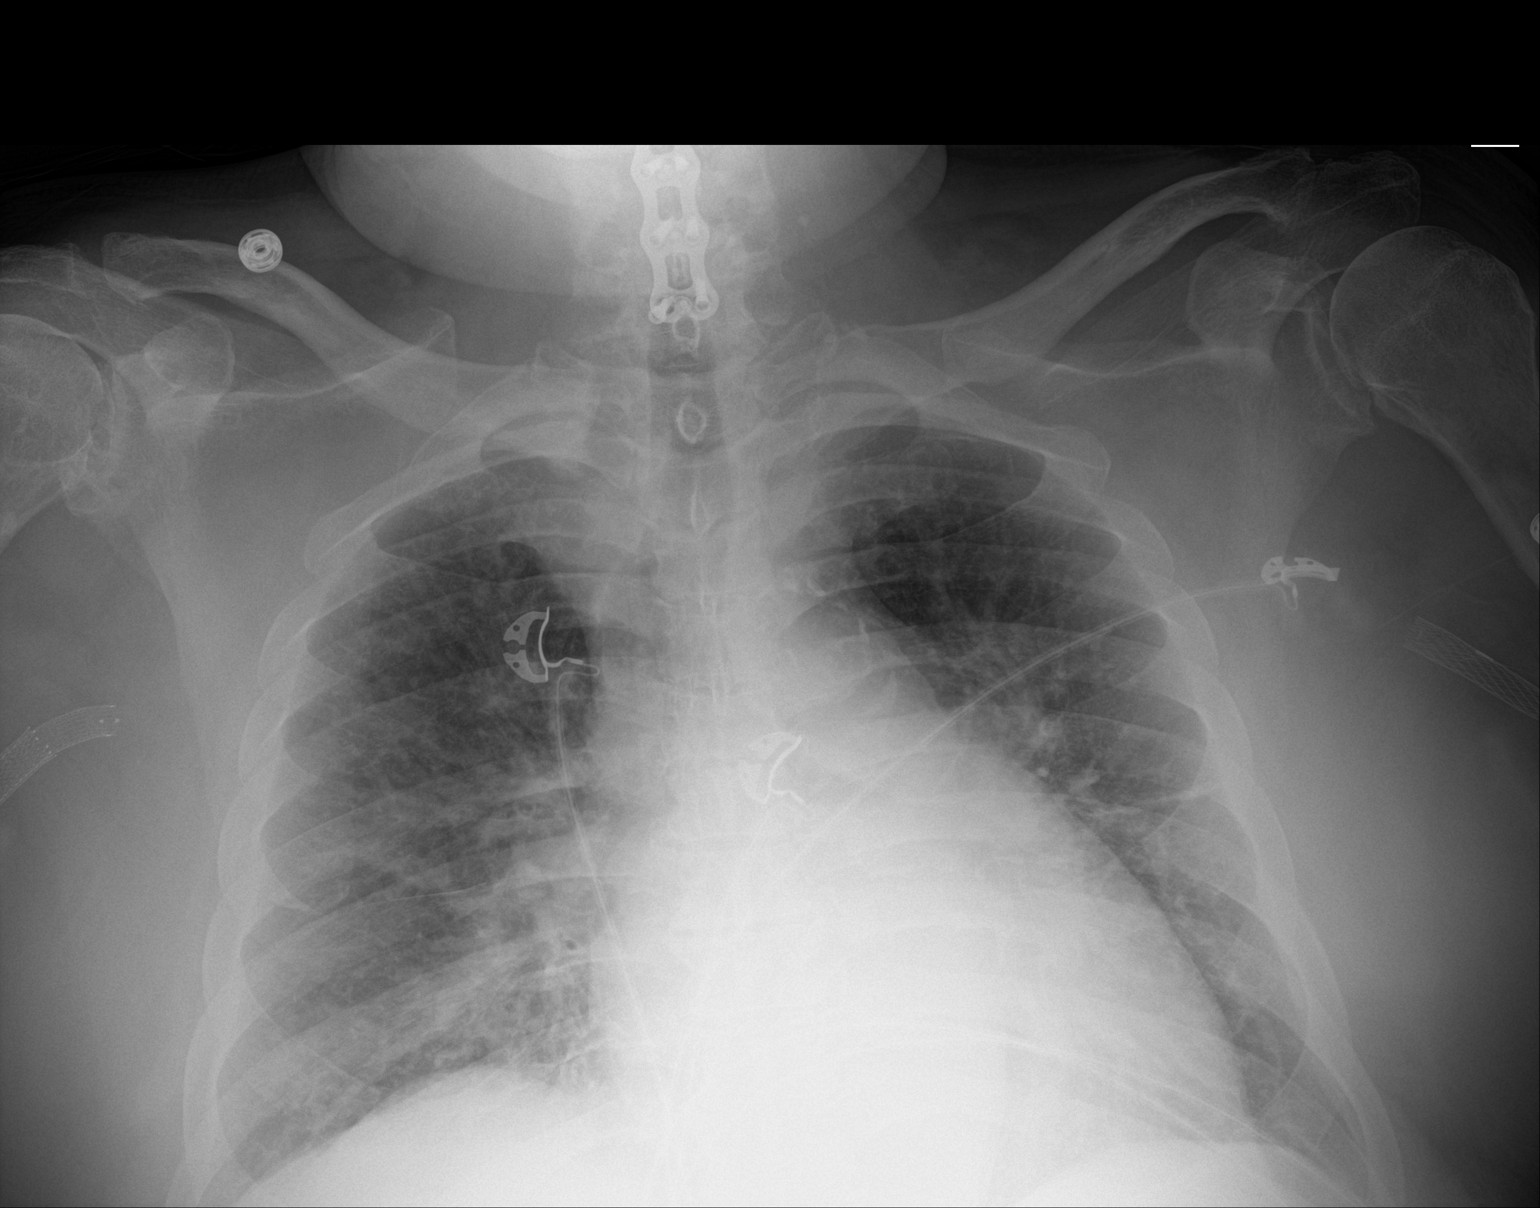

[1 of 1 positions shown; findings below may reference images not displayed]

FINDINGS: Cardiomegaly, pulmonary vascular congestion and mild bibasilar
atelectasis again noted.

No pneumothorax.

Little significant change from the prior study.
IMPRESSION: Unchanged appearance of the chest with pulmonary vascular congestion
and mild bibasilar atelectasis.

## 2019-06-15 DIAGNOSIS — I5082 Biventricular heart failure: Secondary | ICD-10-CM | POA: Diagnosis not present

## 2019-06-15 DIAGNOSIS — I5042 Chronic combined systolic (congestive) and diastolic (congestive) heart failure: Secondary | ICD-10-CM | POA: Diagnosis not present

## 2019-06-15 DIAGNOSIS — I132 Hypertensive heart and chronic kidney disease with heart failure and with stage 5 chronic kidney disease, or end stage renal disease: Secondary | ICD-10-CM | POA: Diagnosis not present

## 2019-06-15 DIAGNOSIS — N186 End stage renal disease: Secondary | ICD-10-CM | POA: Diagnosis not present

## 2019-06-15 DIAGNOSIS — A419 Sepsis, unspecified organism: Secondary | ICD-10-CM | POA: Diagnosis not present

## 2019-06-15 DIAGNOSIS — E1122 Type 2 diabetes mellitus with diabetic chronic kidney disease: Secondary | ICD-10-CM | POA: Diagnosis not present

## 2019-06-16 ENCOUNTER — Other Ambulatory Visit: Payer: Self-pay | Admitting: Internal Medicine

## 2019-06-16 DIAGNOSIS — N186 End stage renal disease: Secondary | ICD-10-CM | POA: Diagnosis not present

## 2019-06-16 DIAGNOSIS — D509 Iron deficiency anemia, unspecified: Secondary | ICD-10-CM | POA: Diagnosis not present

## 2019-06-16 DIAGNOSIS — N2581 Secondary hyperparathyroidism of renal origin: Secondary | ICD-10-CM | POA: Diagnosis not present

## 2019-06-16 DIAGNOSIS — D631 Anemia in chronic kidney disease: Secondary | ICD-10-CM | POA: Diagnosis not present

## 2019-06-16 DIAGNOSIS — Z992 Dependence on renal dialysis: Secondary | ICD-10-CM | POA: Diagnosis not present

## 2019-06-16 DIAGNOSIS — E119 Type 2 diabetes mellitus without complications: Secondary | ICD-10-CM | POA: Diagnosis not present

## 2019-06-16 NOTE — Telephone Encounter (Signed)
Done erx 

## 2019-06-17 DIAGNOSIS — D631 Anemia in chronic kidney disease: Secondary | ICD-10-CM | POA: Diagnosis not present

## 2019-06-17 DIAGNOSIS — J449 Chronic obstructive pulmonary disease, unspecified: Secondary | ICD-10-CM

## 2019-06-17 DIAGNOSIS — Z9861 Coronary angioplasty status: Secondary | ICD-10-CM

## 2019-06-17 DIAGNOSIS — Z8546 Personal history of malignant neoplasm of prostate: Secondary | ICD-10-CM

## 2019-06-17 DIAGNOSIS — A419 Sepsis, unspecified organism: Secondary | ICD-10-CM | POA: Diagnosis not present

## 2019-06-17 DIAGNOSIS — F329 Major depressive disorder, single episode, unspecified: Secondary | ICD-10-CM

## 2019-06-17 DIAGNOSIS — Z87891 Personal history of nicotine dependence: Secondary | ICD-10-CM

## 2019-06-17 DIAGNOSIS — N186 End stage renal disease: Secondary | ICD-10-CM

## 2019-06-17 DIAGNOSIS — E039 Hypothyroidism, unspecified: Secondary | ICD-10-CM

## 2019-06-17 DIAGNOSIS — M109 Gout, unspecified: Secondary | ICD-10-CM

## 2019-06-17 DIAGNOSIS — M48062 Spinal stenosis, lumbar region with neurogenic claudication: Secondary | ICD-10-CM

## 2019-06-17 DIAGNOSIS — E1142 Type 2 diabetes mellitus with diabetic polyneuropathy: Secondary | ICD-10-CM

## 2019-06-17 DIAGNOSIS — E1151 Type 2 diabetes mellitus with diabetic peripheral angiopathy without gangrene: Secondary | ICD-10-CM

## 2019-06-17 DIAGNOSIS — I5042 Chronic combined systolic (congestive) and diastolic (congestive) heart failure: Secondary | ICD-10-CM | POA: Diagnosis not present

## 2019-06-17 DIAGNOSIS — I132 Hypertensive heart and chronic kidney disease with heart failure and with stage 5 chronic kidney disease, or end stage renal disease: Secondary | ICD-10-CM | POA: Diagnosis not present

## 2019-06-17 DIAGNOSIS — Z86010 Personal history of colonic polyps: Secondary | ICD-10-CM

## 2019-06-17 DIAGNOSIS — Z992 Dependence on renal dialysis: Secondary | ICD-10-CM

## 2019-06-17 DIAGNOSIS — Z452 Encounter for adjustment and management of vascular access device: Secondary | ICD-10-CM

## 2019-06-17 DIAGNOSIS — M5412 Radiculopathy, cervical region: Secondary | ICD-10-CM

## 2019-06-17 DIAGNOSIS — I2510 Atherosclerotic heart disease of native coronary artery without angina pectoris: Secondary | ICD-10-CM

## 2019-06-17 DIAGNOSIS — I5082 Biventricular heart failure: Secondary | ICD-10-CM | POA: Diagnosis not present

## 2019-06-17 DIAGNOSIS — E1122 Type 2 diabetes mellitus with diabetic chronic kidney disease: Secondary | ICD-10-CM

## 2019-06-17 DIAGNOSIS — Z9181 History of falling: Secondary | ICD-10-CM

## 2019-06-17 DIAGNOSIS — I255 Ischemic cardiomyopathy: Secondary | ICD-10-CM

## 2019-06-17 DIAGNOSIS — Z8673 Personal history of transient ischemic attack (TIA), and cerebral infarction without residual deficits: Secondary | ICD-10-CM

## 2019-06-17 DIAGNOSIS — I252 Old myocardial infarction: Secondary | ICD-10-CM

## 2019-06-17 DIAGNOSIS — F0280 Dementia in other diseases classified elsewhere without behavioral disturbance: Secondary | ICD-10-CM

## 2019-06-18 DIAGNOSIS — N2581 Secondary hyperparathyroidism of renal origin: Secondary | ICD-10-CM | POA: Diagnosis not present

## 2019-06-18 DIAGNOSIS — D509 Iron deficiency anemia, unspecified: Secondary | ICD-10-CM | POA: Diagnosis not present

## 2019-06-18 DIAGNOSIS — D631 Anemia in chronic kidney disease: Secondary | ICD-10-CM | POA: Diagnosis not present

## 2019-06-18 DIAGNOSIS — N186 End stage renal disease: Secondary | ICD-10-CM | POA: Diagnosis not present

## 2019-06-18 DIAGNOSIS — E119 Type 2 diabetes mellitus without complications: Secondary | ICD-10-CM | POA: Diagnosis not present

## 2019-06-18 DIAGNOSIS — Z992 Dependence on renal dialysis: Secondary | ICD-10-CM | POA: Diagnosis not present

## 2019-06-18 IMAGING — DX DG WRIST 2V*L*
2 series · 2 of 2 positions shown · non-contrast
Comparison: Left wrist series of February 20, 2007

CLINICAL DATA: Painful mildly swollen left wrist.  No known injury.

EXAM:
LEFT WRIST - 2 VIEW

[wrist ap]
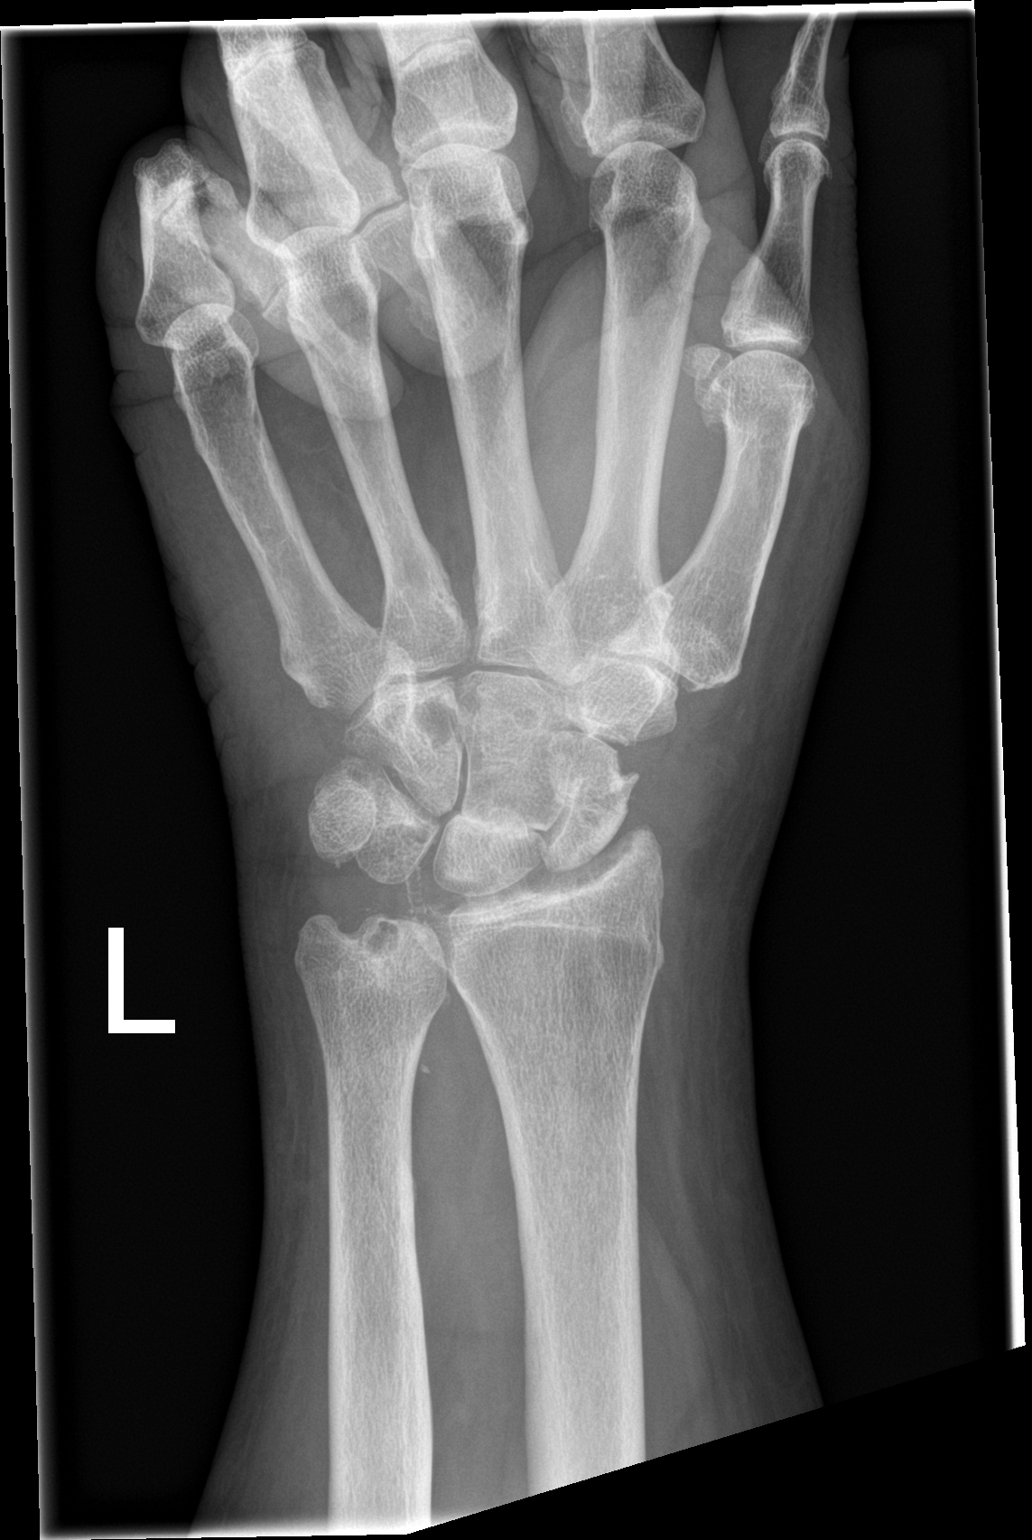

[wrist lat]
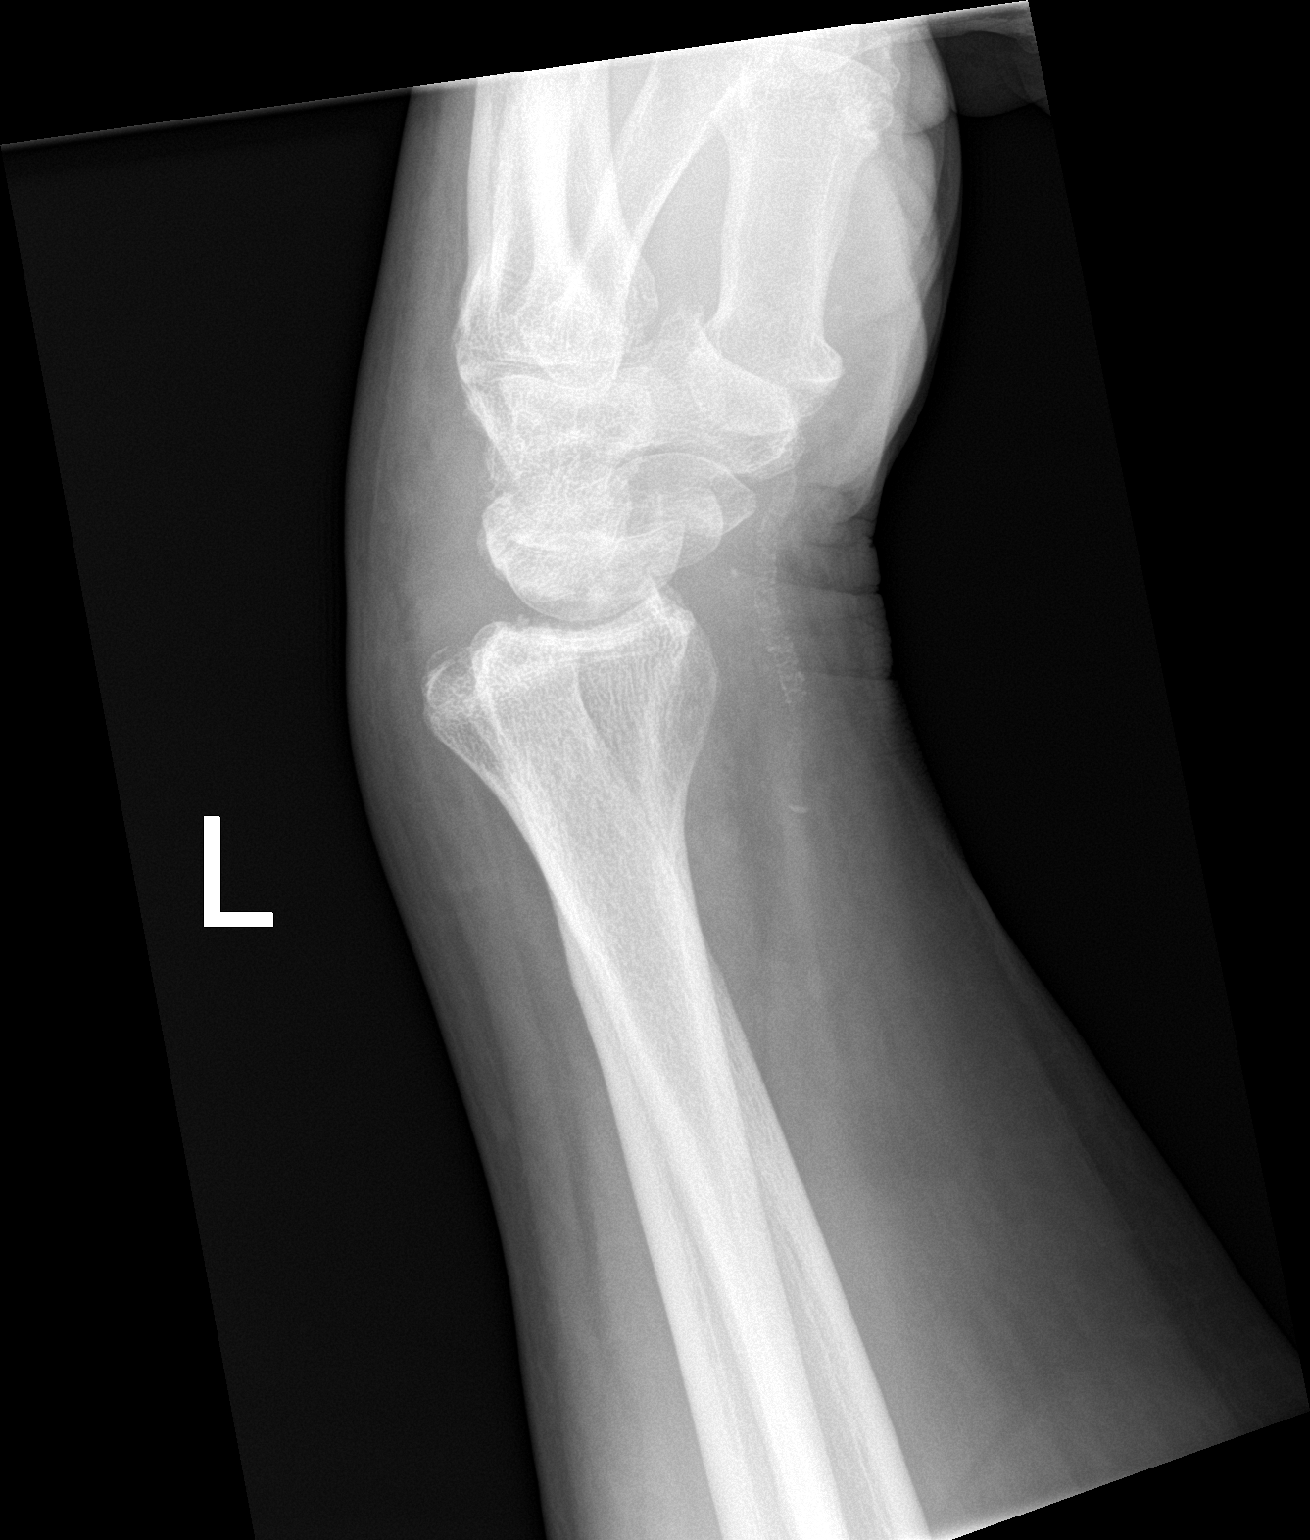

[2 of 2 positions shown; findings below may reference images not displayed]

FINDINGS: The bones are subjectively adequately mineralized. There is
deformity of the scaphoid consistent with a previous fracture
through the junction of the middle and distal thirds of this bone.
There are cystic changes within the distal ulna, waist of the
scaphoid, capitate, hamate, and trapezoid. The joint spaces are
reasonably maintained with the exception of the first CMC joint
where there is mild narrowing.
IMPRESSION: Probable old scaphoid fracture with deformity of the distal aspect
of this bone.

Cystic changes within multiple carpal bones as well as the distal
ulna may reflect gout.

Degenerative narrowing of the first CMC joint.

## 2019-06-20 DIAGNOSIS — Z992 Dependence on renal dialysis: Secondary | ICD-10-CM | POA: Diagnosis not present

## 2019-06-20 DIAGNOSIS — N186 End stage renal disease: Secondary | ICD-10-CM | POA: Diagnosis not present

## 2019-06-20 DIAGNOSIS — D509 Iron deficiency anemia, unspecified: Secondary | ICD-10-CM | POA: Diagnosis not present

## 2019-06-20 DIAGNOSIS — E119 Type 2 diabetes mellitus without complications: Secondary | ICD-10-CM | POA: Diagnosis not present

## 2019-06-20 DIAGNOSIS — D631 Anemia in chronic kidney disease: Secondary | ICD-10-CM | POA: Diagnosis not present

## 2019-06-20 DIAGNOSIS — N2581 Secondary hyperparathyroidism of renal origin: Secondary | ICD-10-CM | POA: Diagnosis not present

## 2019-06-22 DIAGNOSIS — I5042 Chronic combined systolic (congestive) and diastolic (congestive) heart failure: Secondary | ICD-10-CM | POA: Diagnosis not present

## 2019-06-22 DIAGNOSIS — I5082 Biventricular heart failure: Secondary | ICD-10-CM | POA: Diagnosis not present

## 2019-06-22 DIAGNOSIS — N186 End stage renal disease: Secondary | ICD-10-CM | POA: Diagnosis not present

## 2019-06-22 DIAGNOSIS — A419 Sepsis, unspecified organism: Secondary | ICD-10-CM | POA: Diagnosis not present

## 2019-06-22 DIAGNOSIS — E1122 Type 2 diabetes mellitus with diabetic chronic kidney disease: Secondary | ICD-10-CM | POA: Diagnosis not present

## 2019-06-22 DIAGNOSIS — I132 Hypertensive heart and chronic kidney disease with heart failure and with stage 5 chronic kidney disease, or end stage renal disease: Secondary | ICD-10-CM | POA: Diagnosis not present

## 2019-06-23 DIAGNOSIS — N186 End stage renal disease: Secondary | ICD-10-CM | POA: Diagnosis not present

## 2019-06-23 DIAGNOSIS — D509 Iron deficiency anemia, unspecified: Secondary | ICD-10-CM | POA: Diagnosis not present

## 2019-06-23 DIAGNOSIS — E119 Type 2 diabetes mellitus without complications: Secondary | ICD-10-CM | POA: Diagnosis not present

## 2019-06-23 DIAGNOSIS — Z992 Dependence on renal dialysis: Secondary | ICD-10-CM | POA: Diagnosis not present

## 2019-06-23 DIAGNOSIS — N2581 Secondary hyperparathyroidism of renal origin: Secondary | ICD-10-CM | POA: Diagnosis not present

## 2019-06-23 DIAGNOSIS — D631 Anemia in chronic kidney disease: Secondary | ICD-10-CM | POA: Diagnosis not present

## 2019-06-24 DIAGNOSIS — E1122 Type 2 diabetes mellitus with diabetic chronic kidney disease: Secondary | ICD-10-CM | POA: Diagnosis not present

## 2019-06-24 DIAGNOSIS — I5082 Biventricular heart failure: Secondary | ICD-10-CM | POA: Diagnosis not present

## 2019-06-24 DIAGNOSIS — I5042 Chronic combined systolic (congestive) and diastolic (congestive) heart failure: Secondary | ICD-10-CM | POA: Diagnosis not present

## 2019-06-24 DIAGNOSIS — N186 End stage renal disease: Secondary | ICD-10-CM | POA: Diagnosis not present

## 2019-06-24 DIAGNOSIS — A419 Sepsis, unspecified organism: Secondary | ICD-10-CM | POA: Diagnosis not present

## 2019-06-24 DIAGNOSIS — I132 Hypertensive heart and chronic kidney disease with heart failure and with stage 5 chronic kidney disease, or end stage renal disease: Secondary | ICD-10-CM | POA: Diagnosis not present

## 2019-06-25 ENCOUNTER — Other Ambulatory Visit: Payer: Self-pay | Admitting: Internal Medicine

## 2019-06-25 DIAGNOSIS — D631 Anemia in chronic kidney disease: Secondary | ICD-10-CM | POA: Diagnosis not present

## 2019-06-25 DIAGNOSIS — D509 Iron deficiency anemia, unspecified: Secondary | ICD-10-CM | POA: Diagnosis not present

## 2019-06-25 DIAGNOSIS — E119 Type 2 diabetes mellitus without complications: Secondary | ICD-10-CM | POA: Diagnosis not present

## 2019-06-25 DIAGNOSIS — N186 End stage renal disease: Secondary | ICD-10-CM | POA: Diagnosis not present

## 2019-06-25 DIAGNOSIS — N2581 Secondary hyperparathyroidism of renal origin: Secondary | ICD-10-CM | POA: Diagnosis not present

## 2019-06-25 DIAGNOSIS — Z992 Dependence on renal dialysis: Secondary | ICD-10-CM | POA: Diagnosis not present

## 2019-06-27 DIAGNOSIS — Z992 Dependence on renal dialysis: Secondary | ICD-10-CM | POA: Diagnosis not present

## 2019-06-27 DIAGNOSIS — D631 Anemia in chronic kidney disease: Secondary | ICD-10-CM | POA: Diagnosis not present

## 2019-06-27 DIAGNOSIS — N186 End stage renal disease: Secondary | ICD-10-CM | POA: Diagnosis not present

## 2019-06-27 DIAGNOSIS — D509 Iron deficiency anemia, unspecified: Secondary | ICD-10-CM | POA: Diagnosis not present

## 2019-06-27 DIAGNOSIS — E119 Type 2 diabetes mellitus without complications: Secondary | ICD-10-CM | POA: Diagnosis not present

## 2019-06-27 DIAGNOSIS — N2581 Secondary hyperparathyroidism of renal origin: Secondary | ICD-10-CM | POA: Diagnosis not present

## 2019-06-29 DIAGNOSIS — M1712 Unilateral primary osteoarthritis, left knee: Secondary | ICD-10-CM | POA: Diagnosis not present

## 2019-06-29 DIAGNOSIS — I5082 Biventricular heart failure: Secondary | ICD-10-CM | POA: Diagnosis not present

## 2019-06-29 DIAGNOSIS — M25561 Pain in right knee: Secondary | ICD-10-CM | POA: Diagnosis not present

## 2019-06-29 DIAGNOSIS — E1122 Type 2 diabetes mellitus with diabetic chronic kidney disease: Secondary | ICD-10-CM | POA: Diagnosis not present

## 2019-06-29 DIAGNOSIS — A419 Sepsis, unspecified organism: Secondary | ICD-10-CM | POA: Diagnosis not present

## 2019-06-29 DIAGNOSIS — I5042 Chronic combined systolic (congestive) and diastolic (congestive) heart failure: Secondary | ICD-10-CM | POA: Diagnosis not present

## 2019-06-29 DIAGNOSIS — I132 Hypertensive heart and chronic kidney disease with heart failure and with stage 5 chronic kidney disease, or end stage renal disease: Secondary | ICD-10-CM | POA: Diagnosis not present

## 2019-06-29 DIAGNOSIS — N186 End stage renal disease: Secondary | ICD-10-CM | POA: Diagnosis not present

## 2019-06-30 DIAGNOSIS — D509 Iron deficiency anemia, unspecified: Secondary | ICD-10-CM | POA: Diagnosis not present

## 2019-06-30 DIAGNOSIS — N2581 Secondary hyperparathyroidism of renal origin: Secondary | ICD-10-CM | POA: Diagnosis not present

## 2019-06-30 DIAGNOSIS — D631 Anemia in chronic kidney disease: Secondary | ICD-10-CM | POA: Diagnosis not present

## 2019-06-30 DIAGNOSIS — N186 End stage renal disease: Secondary | ICD-10-CM | POA: Diagnosis not present

## 2019-06-30 DIAGNOSIS — Z992 Dependence on renal dialysis: Secondary | ICD-10-CM | POA: Diagnosis not present

## 2019-06-30 DIAGNOSIS — E119 Type 2 diabetes mellitus without complications: Secondary | ICD-10-CM | POA: Diagnosis not present

## 2019-07-01 DIAGNOSIS — N186 End stage renal disease: Secondary | ICD-10-CM | POA: Diagnosis not present

## 2019-07-01 DIAGNOSIS — I5042 Chronic combined systolic (congestive) and diastolic (congestive) heart failure: Secondary | ICD-10-CM | POA: Diagnosis not present

## 2019-07-01 DIAGNOSIS — A419 Sepsis, unspecified organism: Secondary | ICD-10-CM | POA: Diagnosis not present

## 2019-07-01 DIAGNOSIS — I5082 Biventricular heart failure: Secondary | ICD-10-CM | POA: Diagnosis not present

## 2019-07-01 DIAGNOSIS — I132 Hypertensive heart and chronic kidney disease with heart failure and with stage 5 chronic kidney disease, or end stage renal disease: Secondary | ICD-10-CM | POA: Diagnosis not present

## 2019-07-01 DIAGNOSIS — E1122 Type 2 diabetes mellitus with diabetic chronic kidney disease: Secondary | ICD-10-CM | POA: Diagnosis not present

## 2019-07-02 DIAGNOSIS — N2581 Secondary hyperparathyroidism of renal origin: Secondary | ICD-10-CM | POA: Diagnosis not present

## 2019-07-02 DIAGNOSIS — E119 Type 2 diabetes mellitus without complications: Secondary | ICD-10-CM | POA: Diagnosis not present

## 2019-07-02 DIAGNOSIS — E1129 Type 2 diabetes mellitus with other diabetic kidney complication: Secondary | ICD-10-CM | POA: Diagnosis not present

## 2019-07-02 DIAGNOSIS — N186 End stage renal disease: Secondary | ICD-10-CM | POA: Diagnosis not present

## 2019-07-02 DIAGNOSIS — D631 Anemia in chronic kidney disease: Secondary | ICD-10-CM | POA: Diagnosis not present

## 2019-07-02 DIAGNOSIS — D509 Iron deficiency anemia, unspecified: Secondary | ICD-10-CM | POA: Diagnosis not present

## 2019-07-02 DIAGNOSIS — Z992 Dependence on renal dialysis: Secondary | ICD-10-CM | POA: Diagnosis not present

## 2019-07-03 ENCOUNTER — Ambulatory Visit (INDEPENDENT_AMBULATORY_CARE_PROVIDER_SITE_OTHER): Payer: Medicare Other | Admitting: Internal Medicine

## 2019-07-03 ENCOUNTER — Encounter: Payer: Self-pay | Admitting: Internal Medicine

## 2019-07-03 ENCOUNTER — Telehealth (HOSPITAL_COMMUNITY): Payer: Self-pay | Admitting: *Deleted

## 2019-07-03 ENCOUNTER — Other Ambulatory Visit: Payer: Self-pay

## 2019-07-03 ENCOUNTER — Emergency Department (HOSPITAL_COMMUNITY): Payer: Medicare Other

## 2019-07-03 ENCOUNTER — Emergency Department (HOSPITAL_COMMUNITY)
Admission: EM | Admit: 2019-07-03 | Discharge: 2019-07-04 | Disposition: A | Discharge status: Home / Self Care | Payer: Medicare Other | Attending: Emergency Medicine | Admitting: Emergency Medicine

## 2019-07-03 VITALS — BP 122/84 | HR 107 | Temp 97.6°F | Ht 72.0 in | Wt 226.0 lb

## 2019-07-03 DIAGNOSIS — I4892 Unspecified atrial flutter: Secondary | ICD-10-CM

## 2019-07-03 DIAGNOSIS — I499 Cardiac arrhythmia, unspecified: Secondary | ICD-10-CM

## 2019-07-03 DIAGNOSIS — F32A Depression, unspecified: Secondary | ICD-10-CM

## 2019-07-03 DIAGNOSIS — N184 Chronic kidney disease, stage 4 (severe): Secondary | ICD-10-CM

## 2019-07-03 DIAGNOSIS — E1122 Type 2 diabetes mellitus with diabetic chronic kidney disease: Secondary | ICD-10-CM | POA: Diagnosis not present

## 2019-07-03 DIAGNOSIS — R531 Weakness: Secondary | ICD-10-CM | POA: Diagnosis not present

## 2019-07-03 DIAGNOSIS — R296 Repeated falls: Secondary | ICD-10-CM

## 2019-07-03 DIAGNOSIS — Z5321 Procedure and treatment not carried out due to patient leaving prior to being seen by health care provider: Secondary | ICD-10-CM | POA: Diagnosis not present

## 2019-07-03 DIAGNOSIS — F329 Major depressive disorder, single episode, unspecified: Secondary | ICD-10-CM | POA: Diagnosis not present

## 2019-07-03 DIAGNOSIS — M79605 Pain in left leg: Secondary | ICD-10-CM | POA: Diagnosis not present

## 2019-07-03 DIAGNOSIS — M79604 Pain in right leg: Secondary | ICD-10-CM

## 2019-07-03 DIAGNOSIS — R079 Chest pain, unspecified: Secondary | ICD-10-CM | POA: Diagnosis not present

## 2019-07-03 DIAGNOSIS — I639 Cerebral infarction, unspecified: Secondary | ICD-10-CM | POA: Diagnosis not present

## 2019-07-03 DIAGNOSIS — R9431 Abnormal electrocardiogram [ECG] [EKG]: Secondary | ICD-10-CM | POA: Diagnosis present

## 2019-07-03 LAB — CBC
HCT: 39.8 % (ref 39.0–52.0)
Hemoglobin: 12.4 g/dL — ABNORMAL LOW (ref 13.0–17.0)
MCH: 31.4 pg (ref 26.0–34.0)
MCHC: 31.2 g/dL (ref 30.0–36.0)
MCV: 100.8 fL — ABNORMAL HIGH (ref 80.0–100.0)
Platelets: 193 10*3/uL (ref 150–400)
RBC: 3.95 MIL/uL — ABNORMAL LOW (ref 4.22–5.81)
RDW: 16.7 % — ABNORMAL HIGH (ref 11.5–15.5)
WBC: 7 10*3/uL (ref 4.0–10.5)
nRBC: 0 % (ref 0.0–0.2)

## 2019-07-03 LAB — BASIC METABOLIC PANEL
Anion gap: 14 (ref 5–15)
BUN: 58 mg/dL — ABNORMAL HIGH (ref 8–23)
CO2: 27 mmol/L (ref 22–32)
Calcium: 9.2 mg/dL (ref 8.9–10.3)
Chloride: 100 mmol/L (ref 98–111)
Creatinine, Ser: 9.69 mg/dL — ABNORMAL HIGH (ref 0.61–1.24)
GFR calc Af Amer: 6 mL/min — ABNORMAL LOW (ref >60)
GFR calc non Af Amer: 5 mL/min — ABNORMAL LOW (ref >60)
Glucose, Bld: 184 mg/dL — ABNORMAL HIGH (ref 70–99)
Potassium: 5.1 mmol/L (ref 3.5–5.1)
Sodium: 141 mmol/L (ref 135–145)

## 2019-07-03 LAB — TROPONIN I (HIGH SENSITIVITY): Troponin I (High Sensitivity): 22 ng/L — ABNORMAL HIGH (ref <18)

## 2019-07-03 MED ORDER — ZOLPIDEM TARTRATE 5 MG PO TABS: 5.0000 mg | ORAL_TABLET | Freq: Every evening | ORAL | 1 refills | Status: AC | PRN

## 2019-07-03 MED ORDER — SODIUM CHLORIDE 0.9% FLUSH: 3.0000 mL | Freq: Once | INTRAVENOUS | Status: DC

## 2019-07-03 NOTE — Progress Notes (Signed)
Subjective:    Patient ID: [name], male    DOB: [birth year], 68 y.o.   MRN: [id number]  HPI   Here with 1 wk onset decreased stamina, generalized weakness, sob/doe, intermittent mild CP mostly pleuritic, intermittent not associated with wheezing, fever, diaphoresis, n/v, palpitations or dizziness.  Has had several recent falls but no specific injury except did twist the left knee, for which he saw orthopedic earlier today with cortison shot.  Pt denies new neurological symptoms such as new headache, or facial or extremity weakness or numbness   Pt denies polydipsia, polyuria,  Denies worsening depressive symptoms, suicidal ideation, or panic;  Conts to have bialteral leg pain, wife plans to have him f/u with vascular surgury Wt Readings from Last 3 Encounters:  07/03/19 226 lb (102.5 kg)  06/01/19 227 lb (103 kg)  05/20/19 224 lb (101.6 kg)   Past Medical History:  Diagnosis Date  . Acute encephalopathy 10/04/2018  . Allergic rhinitis, cause unspecified 02/24/2014  . Anemia 06/16/2011  . BENIGN PROSTATIC HYPERTROPHY 10/14/2009  . CAD, NATIVE VESSEL 02/06/2009   a. 06/2007 s/p Taxus DES to the RCA;  b. 08/2016 NSTEMI in setting of SVT/PCI: LM 30ost, LAD 12m (3.0x16 Synergy DES), LCX 39m, OM1 60, RI 40, RCA 70p/m, 71m - not amenable to PCI.  [name] Cervical radiculopathy, chronic 02/23/2016   Right c5-6 by NCS/EMG  . Chest pain 08/09/2015  . Chronic combined systolic (congestive) and diastolic (congestive) heart failure ([place])    a. 10/2016 Echo: EF 40-45%, Gr2 DD. mildly dil LA.  [name] COLONIC POLYPS, HX OF 10/14/2009  . Dementia ([place]) [id number]  . Depression 09/24/2015  . DIABETES MELLITUS, TYPE II 02/01/2010  . DYSLIPIDEMIA 06/18/2007  . ESRD (end stage renal disease) on dialysis ([place]) 08/04/2010   ESRD due to DM adn HTN, started HD in 2011. As of 2019 has a R arm graft and gets HD on TTS sched  . FOOT PAIN 08/12/2008  . GAIT DISTURBANCE 03/03/2010  . GASTROENTERITIS, VIRAL 10/14/2009  . GERD  06/18/2007  . GOITER, MULTINODULAR 12/26/2007  . GOUT 06/18/2007  . GYNECOMASTIA 07/17/2010  . Hemodialysis access, fistula mature [facility])    Dialysis T-Th-Sa ([place]) Right upper arm fistula  . Hyperlipidemia 10/16/2011  . Hyperparathyroidism, secondary ([place]) 06/16/2011  . HYPERTENSION 06/18/2007  . Hyperthyroidism   . Hypocalcemia 06/07/2010  . Ischemic cardiomyopathy    a. 10/2016 Echo: EF 40-45%.  . Lumbar stenosis with neurogenic claudication   . ONYCHOMYCOSIS, TOENAILS 12/26/2007  . OSA on CPAP 10/16/2011  . Other malaise and fatigue 11/24/2009  . PERIPHERAL NEUROPATHY 06/18/2007  . Prostate cancer ([place])   . PSVT (paroxysmal supraventricular tachycardia) ([place])    a. 32/9518 complicated by NSTEMI;  b. 11/2016 Treated w/ adenosine in ED;  c. 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  [name] PULMONARY NODULE, RIGHT LOWER LOBE 06/08/2009  . Sleep apnea    cpap machine and o2  . TRANSAMINASES, SERUM, ELEVATED 02/01/2010  . Transfusion history    none recent  . Unspecified hypotension 01/30/2010   Past Surgical History:  Procedure Laterality Date  . A/V SHUNTOGRAM N/A 04/07/2018   Procedure: A/V SHUNTOGRAM;  Surgeon: Waynetta Sandy, MD;  Location: [place] [facility];  Service: Cardiovascular;  Laterality: N/A;  . ARTERIOVENOUS GRAFT PLACEMENT Right 2009   forearm/notes 02/01/2011  . AV FISTULA PLACEMENT  11/07/2011   Procedure: INSERTION OF ARTERIOVENOUS (AV) GORE-TEX GRAFT ARM;  Surgeon: Tinnie Gens, MD;  Location: [place];  Service: Vascular;  Laterality: Left;  . BACK SURGERY  1998  . BALLOON DILATION N/A 11/14/2018   Procedure: BALLOON DILATION;  Surgeon: Ronnette Juniper, MD;  Location: [place];  Service: Gastroenterology;  Laterality: N/A;  . BASCILIC VEIN TRANSPOSITION Right 02/27/2013   Procedure: BASCILIC VEIN TRANSPOSITION;  Surgeon: Mal Misty, MD;  Location: [place];  Service: Vascular;  Laterality: Right;  Right Basilic Vein Transposition   . BILIARY DILATION  03/31/2019    Procedure: BILIARY DILATION;  Surgeon: Clarene Essex, MD;  Location: [facility] ENDOSCOPY;  Service: Endoscopy;;  . BILIARY DILATION  05/20/2019   Procedure: BILIARY DILATION;  Surgeon: Clarene Essex, MD;  Location: [facility] ENDOSCOPY;  Service: Endoscopy;;  . BILIARY STENT PLACEMENT  03/31/2019   Procedure: BILIARY STENT PLACEMENT;  Surgeon: Clarene Essex, MD;  Location[facility]COPY;  Service: Endoscopy;;  . BILIARY STENT PLACEMENT N/A 05/20/2019   Procedure: BILIARY STENT PLACEMENT;  Surgeon: Clarene Essex, MD;  [facility]: [facility];  Service: Endoscopy;  Laterality: N/A;  . CARDIAC CATHETERIZATION N/A 08/06/2016   Procedure: Left Heart Cath and Coronary Angiography;  Surgeon: Jolaine Artist, MD;  Location: [place] [facility];  Service: Cardiovascular;  Laterality: N/A;  . CARDIAC CATHETERIZATION N/A 08/07/2016   Procedure: Coronary/Graft Atherectomy-CSI LAD;  Surgeon: Peter M Martinique, MD;  Location: [place] [facility];  Service: Cardiovascular;  Laterality: N/A;  . CERVICAL SPINE SURGERY  2/09   "to repair nerve problems in my left arm"  . CHOLECYSTECTOMY    . COLONOSCOPY WITH PROPOFOL N/A 04/26/2017   Procedure: COLONOSCOPY WITH PROPOFOL;  Surgeon: Otis Brace, MD;  Location: [place];  Service: Gastroenterology;  Laterality: N/A;  . CORONARY ANGIOPLASTY WITH STENT PLACEMENT  06/11/2008  . CORONARY ANGIOPLASTY WITH STENT PLACEMENT  06/2007   TAXUS stent to RCA/notes 01/31/2011  . ENDOSCOPIC RETROGRADE CHOLANGIOPANCREATOGRAPHY (ERCP) WITH PROPOFOL N/A 03/31/2019   Procedure: ENDOSCOPIC RETROGRADE CHOLANGIOPANCREATOGRAPHY (ERCP) WITH PROPOFOL;  Surgeon: Clarene Essex, MD;  Location: [place];  Service: Endoscopy;  Laterality: N/A;  . ERCP N/A 11/14/2018   Procedure: ENDOSCOPIC RETROGRADE CHOLANGIOPANCREATOGRAPHY (ERCP);  Surgeon: Ronnette Juniper, MD;  Location: [place];  Service: Gastroenterology;  Laterality: N/A;  . ERCP N/A 05/20/2019   Procedure: ENDOSCOPIC RETROGRADE CHOLANGIOPANCREATOGRAPHY (ERCP);   Surgeon: Clarene Essex, MD;  [facility];  Service: Endoscopy;  Laterality: N/A;  . ESOPHAGOGASTRODUODENOSCOPY  09/28/2011   Procedure: ESOPHAGOGASTRODUODENOSCOPY (EGD);  Surgeon: Missy Sabins, MD;  Location: [facility] ENDOSCOPY;  Service: Endoscopy;  Laterality: N/A;  . ESOPHAGOGASTRODUODENOSCOPY N/A 04/07/2015   Procedure: ESOPHAGOGASTRODUODENOSCOPY (EGD);  Surgeon: Teena Irani, MD;  Location: [facility];  Service: Endoscopy;  Laterality: N/A;  . ESOPHAGOGASTRODUODENOSCOPY N/A 04/19/2015   Procedure: ESOPHAGOGASTRODUODENOSCOPY (EGD);  Surgeon: Arta Silence, MD;  Location: [facility] ENDOSCOPY;  Service: Endoscopy;  Laterality: N/A;  . ESOPHAGOGASTRODUODENOSCOPY  11/14/2018   Procedure: ESOPHAGOGASTRODUODENOSCOPY (EGD);  Surgeon: Ronnette Juniper, MD;  Location: [facility] ENDOSCOPY;  Service: Gastroenterology;;  . ESOPHAGOGASTRODUODENOSCOPY (EGD) WITH PROPOFOL N/A 06/13/2018   Procedure: ESOPHAGOGASTRODUODENOSCOPY (EGD) WITH PROPOFOL;  Surgeon: Wonda Horner, MD;  Location: [facility] ENDOSCOPY;  Service: Endoscopy;  Laterality: N/A;  . FLEXIBLE SIGMOIDOSCOPY N/A 05/21/2017   Procedure: Conni Elliot;  Surgeon: Clarene Essex, MD;  Location: [place];  Service: Endoscopy;  Laterality: N/A;  . FLEXIBLE SIGMOIDOSCOPY Left 07/02/2017   Procedure: FLEXIBLE SIGMOIDOSCOPY;  Surgeon: Laurence Spates, MD;  Location: [place];  Service: Endoscopy;  Laterality: Left;  . FOREIGN BODY REMOVAL  09/2003   via upper endoscopy/notes 02/12/2011  . GIVENS CAPSULE STUDY  09/30/2011  Procedure: GIVENS CAPSULE STUDY;  Surgeon: Jeryl Columbia, MD;  Location: [facility] ENDOSCOPY;  Service: Endoscopy;  Laterality: N/A;  . INSERTION OF DIALYSIS CATHETER Right 2014  . INSERTION OF DIALYSIS CATHETER Left 02/11/2013   Procedure: INSERTION OF DIALYSIS CATHETER;  Surgeon: Conrad Keller, MD;  Location: [place];  Service: Vascular;  Laterality: Left;  Ultrasound guided  . IR AV DIALY SHUNT INTRO NEEDLE/INTRACATH INITIAL W/PTA/IMG RIGHT Right  11/14/2018  . IR US GUIDE VASC ACCESS RIGHT  11/14/2018  . LUMBAR LAMINECTOMY/DECOMPRESSION MICRODISCECTOMY Bilateral 07/31/2017   Procedure: LAMINECTOMY AND FORAMINOTOMY- BILATERAL LUMBAR TWO- LUMBAR THREE;  Surgeon: Earnie Larsson, MD;  Location: [place];  Service: Neurosurgery;  Laterality: Bilateral;  LAMINECTOMY AND FORAMINOTOMY- BILATERAL LUMBAR 2- LUMBAR 3  . PERIPHERAL VASCULAR BALLOON ANGIOPLASTY  04/07/2018   Procedure: PERIPHERAL VASCULAR BALLOON ANGIOPLASTY;  Surgeon: Waynetta Sandy, MD;  Location: [place] [facility];  Service: Cardiovascular;;  RUE AVF  . REMOVAL OF A DIALYSIS CATHETER Right 02/11/2013   Procedure: REMOVAL OF A DIALYSIS CATHETER;  Surgeon: Conrad Deerfield, MD;  Location: [place];  Service: Vascular;  Laterality: Right;  . REMOVAL OF STONES  11/14/2018   Procedure: REMOVAL OF STONES;  Surgeon: Ronnette Juniper, MD;  Location: [facility] ENDOSCOPY;  Service: Gastroenterology;;  . REMOVAL OF STONES  03/31/2019   Procedure: REMOVAL OF STONES;  Surgeon: Clarene Essex, MD;  Location: [place];  Service: Endoscopy;;  . Azzie Almas DILATION N/A 04/07/2015   Procedure: Azzie Almas DILATION;  Surgeon: Teena Irani, MD;  [facility]OS[facility];  Service: Endoscopy;  Laterality: N/A;  . SHUNTOGRAM N/A 09/20/2011   Procedure: Earney Mallet;  Surgeon: Conrad [place], MD;  Location: [facility] CATH LAB;  Service: Cardiovascular;  Laterality: N/A;  . SPHINCTEROTOMY  11/14/2018   Procedure: SPHINCTEROTOMY;  Surgeon: Ronnette Juniper, MD;  Location: [facility] ENDOSCOPY;  Service: Gastroenterology;;  . Joan Mayans  03/31/2019   Procedure: Joan Mayans;  Surgeon: Clarene Essex, MD;  Location: [facility] ENDOSCOPY;  Service: Endoscopy;;  . SPHINCTEROTOMY  05/20/2019   Procedure: Joan Mayans;  Surgeon: Clarene Essex, MD;  [facility]OS[facility];  Service: Endoscopy;;  . SPYGLASS CHOLANGIOSCOPY N/A 05/20/2019   Procedure: [id number] CHOLANGIOSCOPY;  Surgeon: Clarene Essex, MD;  [facility] ENDOS[facility];  Service: Endoscopy;  Laterality: N/A;  Bess Kinds  LITHOTRIPSY N/A 05/20/2019   Procedure: [id number] LITHOTRIPSY;  Surgeon: Clarene Essex, MD;  [facility]OS[facility];  Service: Endoscopy;  Laterality: N/A;  . STENT REMOVAL  05/20/2019   Procedure: STENT REMOVAL;  Surgeon: Clarene Essex, MD;  [facility] ENDOS[facility];  Service: Endoscopy;;  . SVT ABLATION N/A 11/26/2016   Procedure: SVT Ablation;  Surgeon: Will Meredith Leeds, MD;  Location: [place] [facility];  Service: Cardiovascular;  Laterality: N/A;  . TEE WITHOUT CARDIOVERSION N/A 08/25/2018   Procedure: TRANSESOPHAGEAL ECHOCARDIOGRAM (TEE);  Surgeon: Dorothy Spark, MD;  Location: [place];  Service: Cardiovascular;  Laterality: N/A;  . TEE WITHOUT CARDIOVERSION N/A 04/02/2019   Procedure: TRANSESOPHAGEAL ECHOCARDIOGRAM (TEE);  Surgeon: Josue Hector, MD;  Location: [facility] ENDOSCOPY;  Service: Cardiovascular;  Laterality: N/A;  . TONSILLECTOMY    . TOTAL KNEE ARTHROPLASTY Right 08/02/2015   Procedure: TOTAL KNEE ARTHROPLASTY;  Surgeon: Renette Butters, MD;  Location: [place];  Service: Orthopedics;  Laterality: Right;  . VENOGRAM N/A 01/26/2013   Procedure: VENOGRAM;  Surgeon: Angelia Mould, MD;  Location: [facility] CATH LAB;  Service: Cardiovascular;  Laterality: N/A;    reports that he quit smoking about 13 years ago. His smoking use included cigarettes. He  has a 25.00 pack-year smoking history. He quit smokeless tobacco use about 13 years ago. He reports that he does not drink alcohol or use drugs. family history includes Diabetes in his father; Healthy in his child, child, and child; Heart disease in his father and sister; Hypertension in his father; Kidney failure in his father; Thyroid nodules in his sister. Allergies  Allergen Reactions  . Statins Other (See Comments)    Weak muscles  . Ciprofloxacin Rash   Current Outpatient Medications on File Prior to Visit  Medication Sig Dispense Refill  . acetaminophen (TYLENOL) 325 MG tablet Take 325 mg by mouth every 6 (six) hours as needed  for mild pain or headache.     . Alirocumab (PRALUENT) 150 MG/ML SOAJ Inject 1 pen into the skin every 14 (fourteen) days. 6 pen 3  . allopurinol (ZYLOPRIM) 100 MG tablet Take 1 tablet by mouth once daily 90 tablet 0  . aspirin EC 325 MG tablet Take 1 tablet (325 mg total) by mouth daily. 30 tablet 0  . betamethasone dipropionate (DIPROLENE) 0.05 % cream Apply 1 application topically daily as needed (leg sores).    . citalopram (CELEXA) 20 MG tablet Take 20 mg by mouth at bedtime.    . colestipol (COLESTID) 1 g tablet Take 3 tablets (3 g total) by mouth daily. 90 tablet 11  . doxercalciferol ([name]) 4 MCG/2ML injection Inject 1 mL (2 mcg total) into the vein Every Tuesday,Thursday,and Saturday with dialysis. 2 mL 0  . ezetimibe (ZETIA) 10 MG tablet TAKE ONE TABLET BY MOUTH ONCE DAILY (Patient taking differently: Take 10 mg by mouth every evening. ) 90 tablet 3  . ferric citrate (AURYXIA) 1 GM 210 MG(Fe) tablet Take 2 tablets (420 mg total) by mouth 3 (three) times daily with meals. (Patient taking differently: Take 630 mg by mouth 3 (three) times daily with meals. ) 60 tablet 0  . gabapentin (NEURONTIN) 300 MG capsule TAKE 1 TO 2 CAPSULES BY MOUTH AT BEDTIME AS NEEDED FOR PAIN AND FOR SLEEP 180 capsule 0  . hydroxypropyl methylcellulose / hypromellose (ISOPTO TEARS / GONIOVISC) 2.5 % ophthalmic solution Place 1 drop into both eyes 3 (three) times daily as needed for dry eyes. 15 mL 11  . loperamide (IMODIUM) 2 MG capsule Take 2 mg by mouth 4 (four) times daily as needed for diarrhea or loose stools.    . methimazole (TAPAZOLE) 5 MG tablet Take 0.5 tablets (2.5 mg total) by mouth daily. (Patient taking differently: Take 2.5 mg by mouth at bedtime. ) 45 tablet 3  . midodrine (PROAMATINE) 10 MG tablet Take 1 tablet (10 mg total) by mouth 3 (three) times daily with meals. (Patient taking differently: Take 10 mg by mouth Every Tuesday,Thursday,and Saturday with dialysis. ) 90 tablet 0  . multivitamin  (RENA-VIT) TABS tablet Take 1 tablet by mouth daily.     . pantoprazole (PROTONIX) 40 MG tablet Take 40 mg by mouth at bedtime.     . polyethylene glycol (MIRALAX / GLYCOLAX) 17 g packet Take 17 g by mouth daily as needed for mild constipation.    . SENSIPAR 90 MG tablet Take 90 mg by mouth 2 (two) times daily.    . traMADol (ULTRAM) 50 MG tablet Take 1 tablet (50 mg total) by mouth every 6 (six) hours as needed. (Patient taking differently: Take 50 mg by mouth every 6 (six) hours as needed (pain). ) 14 tablet 0   No current facility-administered medications on file prior to  visit.    Review of Systems  Constitutional: Negative for other unusual diaphoresis or sweats HENT: Negative for ear discharge or swelling Eyes: Negative for other worsening visual disturbances Respiratory: Negative for stridor or other swelling  Gastrointestinal: Negative for worsening distension or other blood Genitourinary: Negative for retention or other urinary change Musculoskeletal: Negative for other MSK pain or swelling Skin: Negative for color change or other new lesions Neurological: Negative for worsening tremors and other numbness  Psychiatric/Behavioral: Negative for worsening agitation or other fatigue All otherwise neg per pt    Objective:   Physical Exam BP 122/84   Pulse (!) 107   Temp 97.6 F (36.4 C) (Oral)   Ht 6' (1.829 m)   Wt 226 lb (102.5 kg)   SpO2 95%   BMI 30.65 kg/m  VS noted, obese Constitutional: Pt appears in NAD HENT: Head: NCAT.  Right Ear: External ear normal.  Left Ear: External ear normal.  Eyes: . Pupils are equal, round, and reactive to light. Conjunctivae and EOM are normal Nose: without d/c or deformity Neck: Neck supple. Gross normal ROM Cardiovascular: Normal rate and irregular rhythm.   Pulmonary/Chest: Effort normal and breath sounds without rales or wheezing.  Abd:  Soft, NT, ND, + BS, no organomegaly Neurological: Pt is alert. At baseline orientation, motor  grossly intact Skin: Skin is warm. No rashes, other new lesions, no LE edema Psychiatric: Pt behavior is normal without agitation , ? Depressed affet All otherwise neg per pt  ECG I have personally interpreted today   Atrial flutter 94    Assessment & Plan:

## 2019-07-03 NOTE — Telephone Encounter (Signed)
Attempting to reach pt to schedule exam appointment.

## 2019-07-03 NOTE — ED Notes (Signed)
Family updated on wait for treatment room.

## 2019-07-03 NOTE — Patient Instructions (Addendum)
Your EKG was done today and shows a new heart rhythm problem  Please go to the [facility] ED now for further evaluation  Please continue all other medications as before, and refills have been done if requested - the ambien  Please have the pharmacy call with any other refills you may need.  Please continue your efforts at being more active, low cholesterol diet, and weight control.  Please keep your appointments with your specialists as you may have planned  You will be contacted regarding the referral for: Leg circulation testing of the arteries  Please return in 3 months, or sooner if needed

## 2019-07-03 NOTE — ED Triage Notes (Signed)
Pt here sent by PCP for abnormal EKG-pt went to PCP today for one week of weakness and decreased stamina along with nonradiating, generalized, dull chest pain and shortness of breath on exertion. T, TH, S dialysis pt, had full tx yesterday.

## 2019-07-04 ENCOUNTER — Encounter: Payer: Self-pay | Admitting: Internal Medicine

## 2019-07-04 ENCOUNTER — Telehealth: Payer: Self-pay | Admitting: Internal Medicine

## 2019-07-04 DIAGNOSIS — M79605 Pain in left leg: Secondary | ICD-10-CM | POA: Insufficient documentation

## 2019-07-04 DIAGNOSIS — N2581 Secondary hyperparathyroidism of renal origin: Secondary | ICD-10-CM | POA: Diagnosis not present

## 2019-07-04 DIAGNOSIS — I4892 Unspecified atrial flutter: Secondary | ICD-10-CM

## 2019-07-04 DIAGNOSIS — Z992 Dependence on renal dialysis: Secondary | ICD-10-CM | POA: Diagnosis not present

## 2019-07-04 DIAGNOSIS — D509 Iron deficiency anemia, unspecified: Secondary | ICD-10-CM | POA: Diagnosis not present

## 2019-07-04 DIAGNOSIS — D631 Anemia in chronic kidney disease: Secondary | ICD-10-CM | POA: Diagnosis not present

## 2019-07-04 DIAGNOSIS — M79604 Pain in right leg: Secondary | ICD-10-CM | POA: Insufficient documentation

## 2019-07-04 DIAGNOSIS — N186 End stage renal disease: Secondary | ICD-10-CM | POA: Diagnosis not present

## 2019-07-04 DIAGNOSIS — E119 Type 2 diabetes mellitus without complications: Secondary | ICD-10-CM | POA: Diagnosis not present

## 2019-07-04 LAB — TROPONIN I (HIGH SENSITIVITY): Troponin I (High Sensitivity): 23 ng/L — ABNORMAL HIGH (ref <18)

## 2019-07-04 NOTE — Telephone Encounter (Signed)
Shirron to contact wife - I see where pt was unable to wait more than 11 hrs at the ED to be seen yesterday  I will refer urgent to cardiology

## 2019-07-04 NOTE — Assessment & Plan Note (Signed)
icnreased recently despite getting current PT, to cont this

## 2019-07-04 NOTE — Assessment & Plan Note (Signed)
stable overall by history and exam, recent data reviewed with pt, and pt to continue medical treatment as before,  to f/u any worsening symptoms or concerns  

## 2019-07-04 NOTE — ED Notes (Signed)
Pts wife stated that she is taking him home.

## 2019-07-04 NOTE — Assessment & Plan Note (Signed)
[place] for arterial dopplers and f/u vascular as planned

## 2019-07-04 NOTE — Assessment & Plan Note (Addendum)
New onset, very likely cause of recurrent fall and presenting symptoms, advised to go to ED  Note:  Total time for pt hx, exam, review of record with pt in the room, determination of diagnoses and plan for further eval and tx is > 40 min, with over 50% spent in coordination and counseling of patient including the differential dx, tx, further evaluation and other management of atrial flutter, depression, DM, recurrent falls, bialt leg pain

## 2019-07-05 IMAGING — US US ABDOMEN LIMITED
1 series · 14 of 25 positions shown · non-contrast
Comparison: CT abdomen and pelvis 07/16/2018

CLINICAL DATA: Elevated ALT.

EXAM:
ULTRASOUND ABDOMEN LIMITED RIGHT UPPER QUADRANT

[Series 1: us abdomen limited · 0.26mm/px · 14 of 25 slices shown]
[im 1/25]
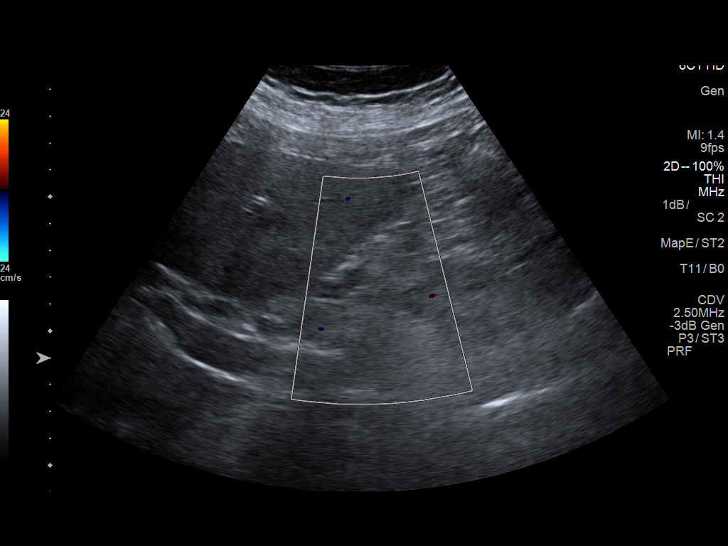
[im 3/25]
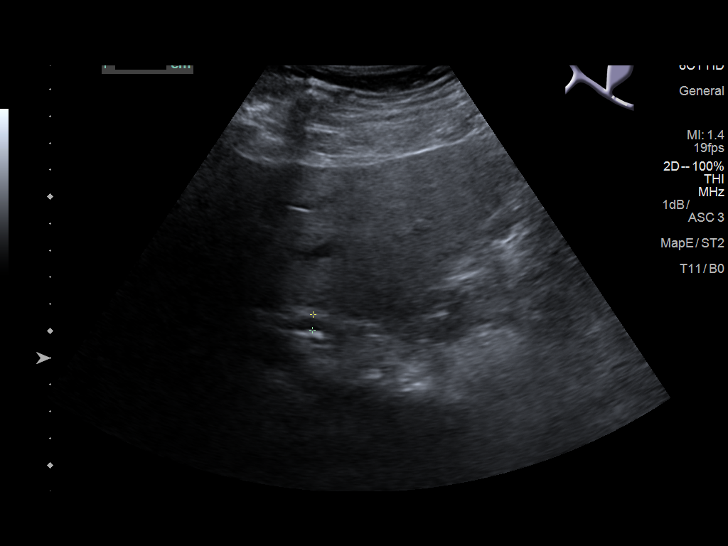
[im 5/25]
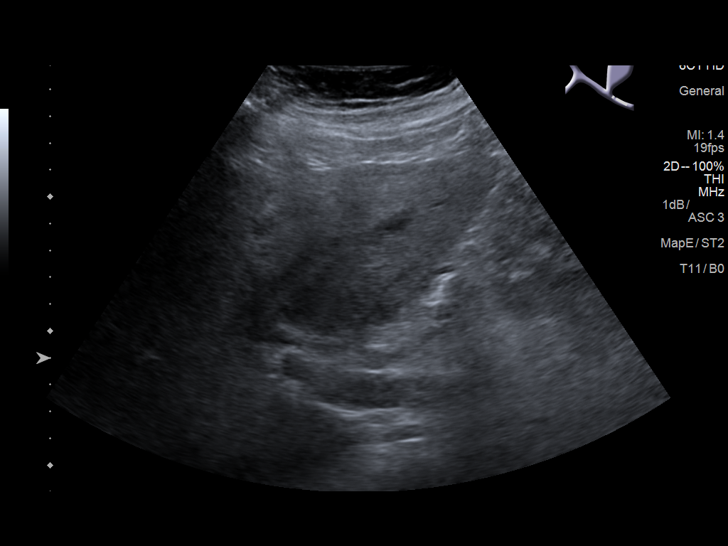
[im 7/25]
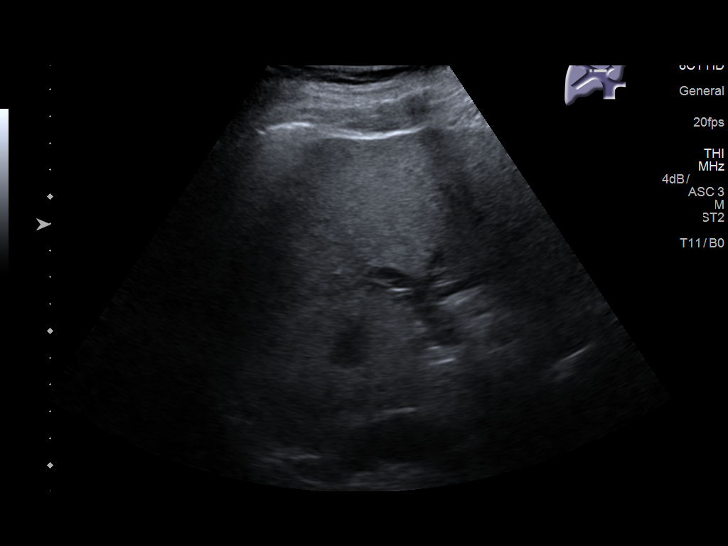
[im 9/25]
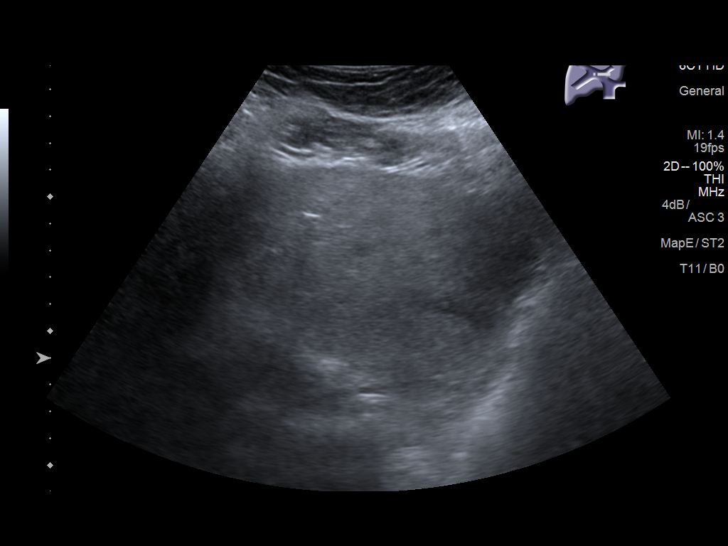
[im 10/25]
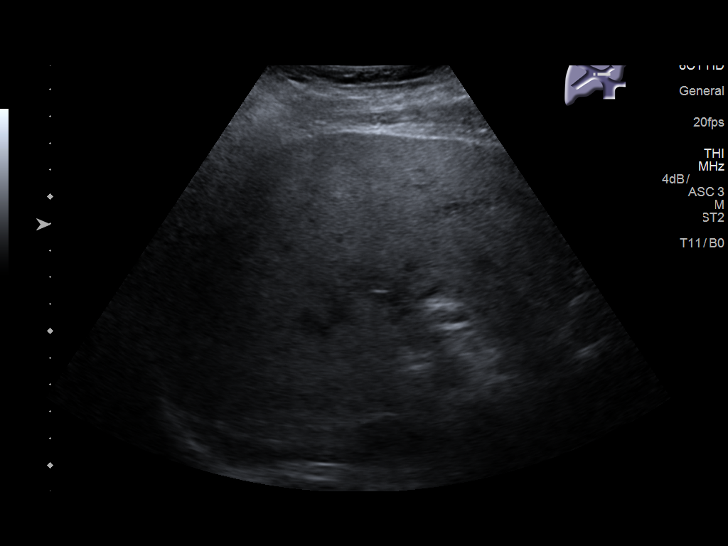
[im 12/25]
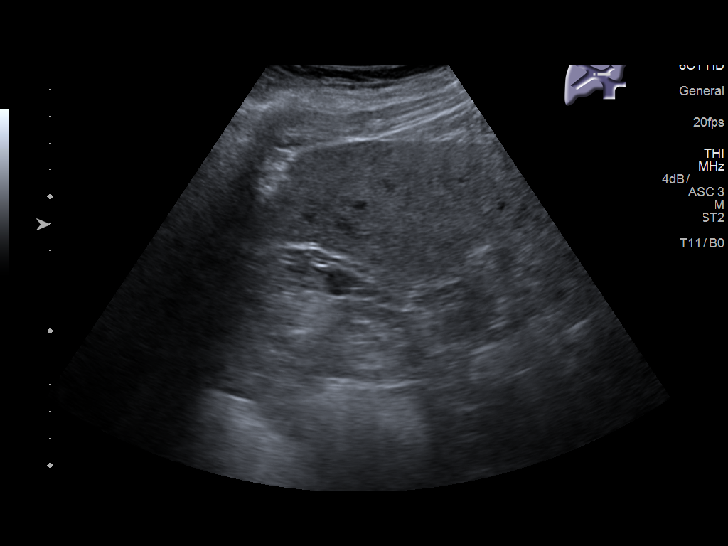
[im 14/25]
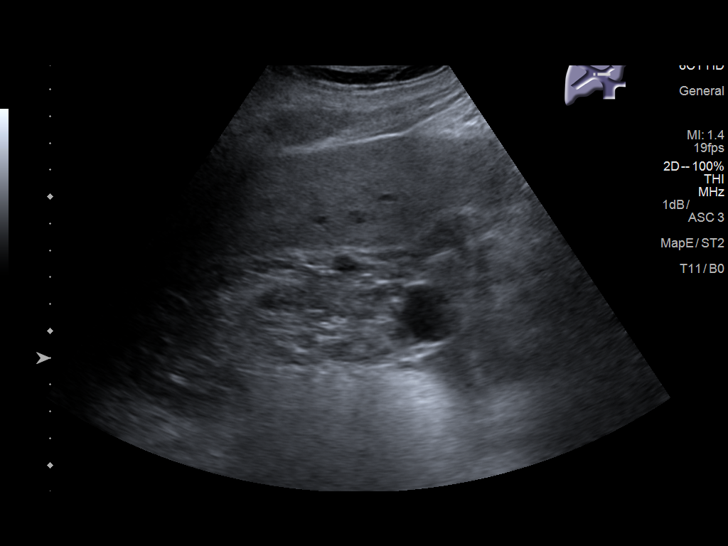
[im 16/25]
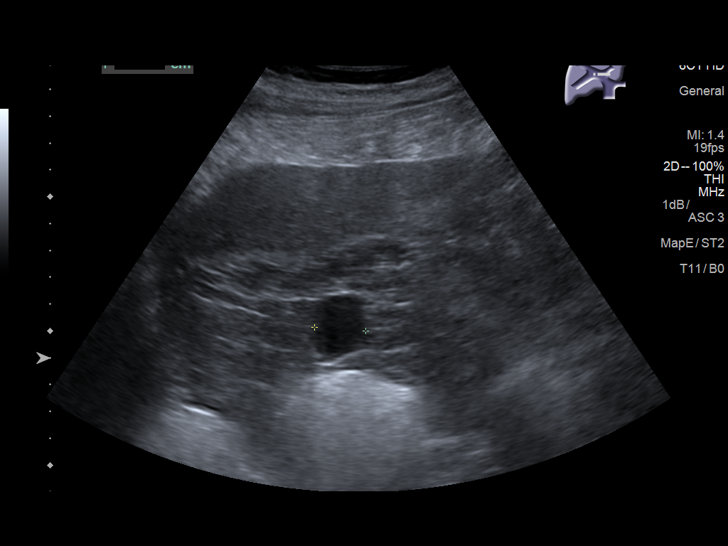
[im 17/25]
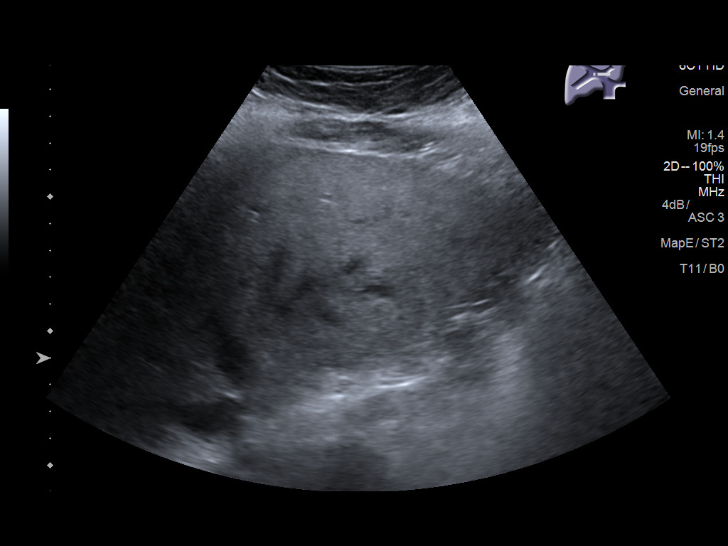
[im 19/25]
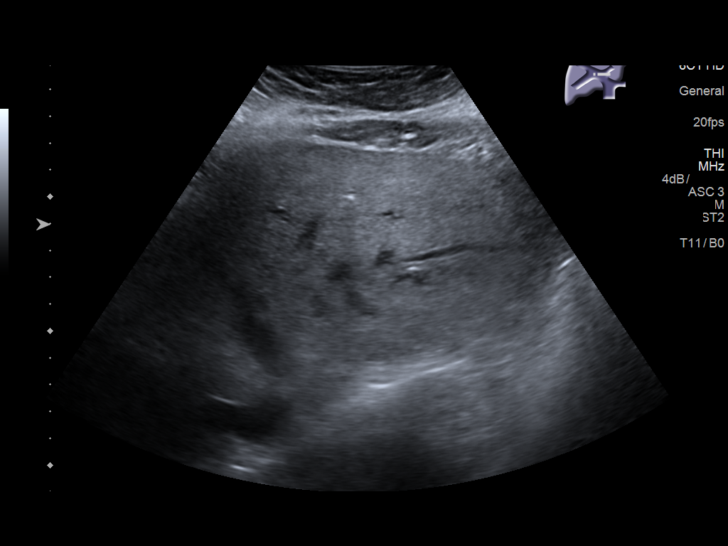
[im 21/25]
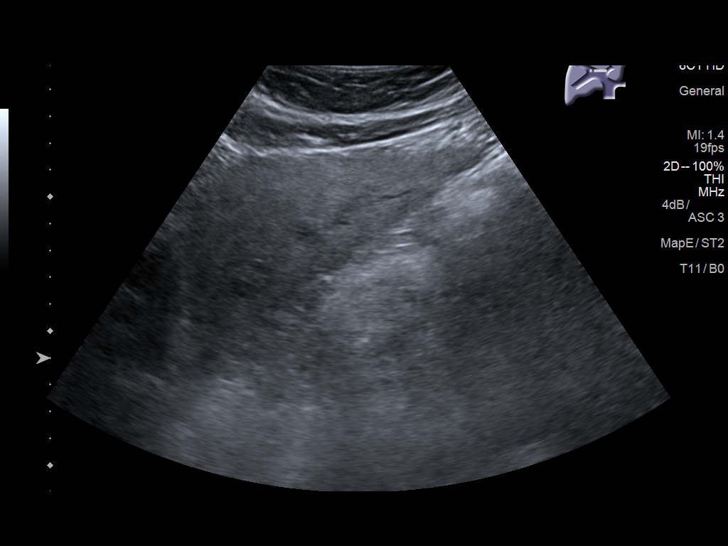
[im 23/25]
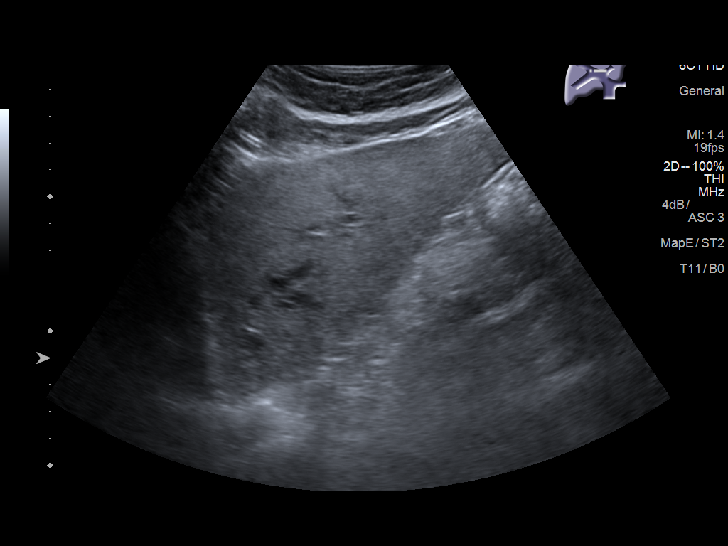
[im 25/25]
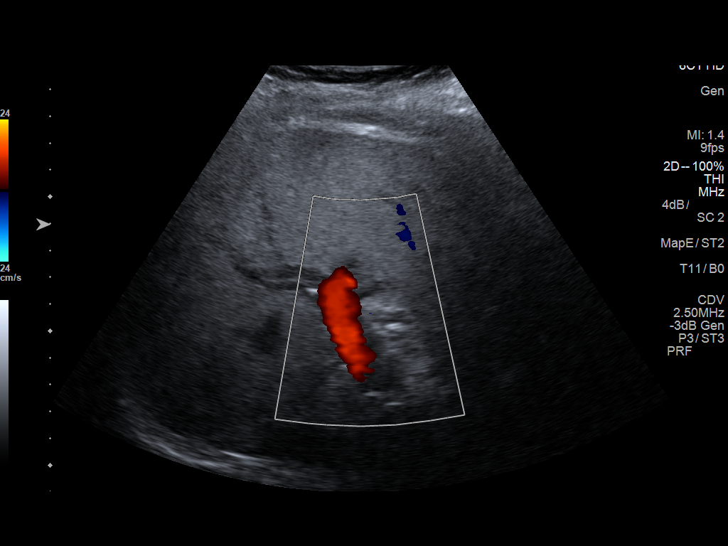

[14 of 25 positions shown; findings below may reference images not displayed]

FINDINGS: Gallbladder:

Gallbladder is surgically absent.

Common bile duct:

Diameter: 6 mm, normal

Liver:

Diffusely increased hepatic parenchymal echotexture consistent with
diffuse fatty infiltration. No focal lesions identified. Portal vein
is patent on color Doppler imaging with normal direction of blood
flow towards the liver.

Incidental finding of cyst in the lower pole right kidney. No
hydronephrosis.
IMPRESSION: Surgical absence of the gallbladder. Diffuse fatty infiltration of
the liver. No evidence of biliary obstruction.

## 2019-07-06 ENCOUNTER — Telehealth: Payer: Self-pay | Admitting: Cardiology

## 2019-07-06 DIAGNOSIS — E1122 Type 2 diabetes mellitus with diabetic chronic kidney disease: Secondary | ICD-10-CM | POA: Diagnosis not present

## 2019-07-06 DIAGNOSIS — I132 Hypertensive heart and chronic kidney disease with heart failure and with stage 5 chronic kidney disease, or end stage renal disease: Secondary | ICD-10-CM | POA: Diagnosis not present

## 2019-07-06 DIAGNOSIS — I5082 Biventricular heart failure: Secondary | ICD-10-CM | POA: Diagnosis not present

## 2019-07-06 DIAGNOSIS — A419 Sepsis, unspecified organism: Secondary | ICD-10-CM | POA: Diagnosis not present

## 2019-07-06 DIAGNOSIS — I5042 Chronic combined systolic (congestive) and diastolic (congestive) heart failure: Secondary | ICD-10-CM | POA: Diagnosis not present

## 2019-07-06 DIAGNOSIS — N186 End stage renal disease: Secondary | ICD-10-CM | POA: Diagnosis not present

## 2019-07-06 NOTE — Telephone Encounter (Signed)
New Message    Patient's wife would like to speak to nurse about husbands abnormal EKG to see if she could get an earlier appointment then what he has open.

## 2019-07-06 NOTE — Telephone Encounter (Signed)
Advised to contact Bensimhon's office to discuss further and schedule overdue follow up. Informed wife that pt considered PRN f/u w/ Camnitz as of February this year. Informed that last EKG in pt chart is NOT abnormal. Wife thought that because he saw Camnitz for abnormal heart rhythm before that he should see him for abnormal EKG.  Informed wife that last 2 EKGs in pt chart are NOT abnormal.  Informed that if Dr. [name] thought he needed EP evaluation again then they can refer back to [place]. Wife verbalized understanding and agreeable to plan.  She is agreeable to reaching out to [facility] and arranging overdue follow up.

## 2019-07-06 NOTE — Telephone Encounter (Signed)
Called pt, LVM.   

## 2019-07-07 ENCOUNTER — Telehealth (HOSPITAL_COMMUNITY): Payer: Self-pay

## 2019-07-07 DIAGNOSIS — D631 Anemia in chronic kidney disease: Secondary | ICD-10-CM | POA: Diagnosis not present

## 2019-07-07 DIAGNOSIS — N2581 Secondary hyperparathyroidism of renal origin: Secondary | ICD-10-CM | POA: Diagnosis not present

## 2019-07-07 DIAGNOSIS — E119 Type 2 diabetes mellitus without complications: Secondary | ICD-10-CM | POA: Diagnosis not present

## 2019-07-07 DIAGNOSIS — D509 Iron deficiency anemia, unspecified: Secondary | ICD-10-CM | POA: Diagnosis not present

## 2019-07-07 DIAGNOSIS — Z992 Dependence on renal dialysis: Secondary | ICD-10-CM | POA: Diagnosis not present

## 2019-07-07 DIAGNOSIS — N186 End stage renal disease: Secondary | ICD-10-CM | POA: Diagnosis not present

## 2019-07-07 NOTE — Telephone Encounter (Signed)
The above patient or their representative was contacted and gave the following answers to these questions:         Do you have any of the following symptoms?    NO  Fever                    Cough                   Shortness of breath  Do  you have any of the following other symptoms? NO   muscle pain         vomiting,        diarrhea        rash         weakness        red eye        abdominal pain         bruising          bruising or bleeding              joint pain           severe headache    Have you been in contact with someone who was or has been sick in the past 2 weeks?  NO  Yes                 Unsure                         Unable to assess   Does the person that you were in contact with have any of the following symptoms?   Cough         shortness of breath           muscle pain         vomiting,            diarrhea            rash            weakness           fever            red eye           abdominal pain           bruising  or  bleeding                joint pain                severe headache                 COMMENTS OR ACTION PLAN FOR THIS PATIENT:          

## 2019-07-08 ENCOUNTER — Other Ambulatory Visit: Payer: Self-pay

## 2019-07-08 ENCOUNTER — Ambulatory Visit (HOSPITAL_BASED_OUTPATIENT_CLINIC_OR_DEPARTMENT_OTHER)
Admission: RE | Admit: 2019-07-08 | Discharge: 2019-07-08 | Disposition: A | Discharge status: Home / Self Care | Payer: Medicare Other | Source: Ambulatory Visit | Attending: Internal Medicine | Admitting: Internal Medicine

## 2019-07-08 ENCOUNTER — Encounter (HOSPITAL_COMMUNITY): Payer: Self-pay | Admitting: Internal Medicine

## 2019-07-08 ENCOUNTER — Ambulatory Visit (HOSPITAL_COMMUNITY)
Admission: RE | Admit: 2019-07-08 | Discharge: 2019-07-08 | Disposition: A | Discharge status: Home / Self Care | Payer: Medicare Other | Source: Ambulatory Visit | Attending: Internal Medicine | Admitting: Internal Medicine

## 2019-07-08 VITALS — BP 126/86 | HR 96 | Wt 226.8 lb

## 2019-07-08 DIAGNOSIS — M79605 Pain in left leg: Secondary | ICD-10-CM

## 2019-07-08 DIAGNOSIS — I5022 Chronic systolic (congestive) heart failure: Secondary | ICD-10-CM

## 2019-07-08 DIAGNOSIS — I2510 Atherosclerotic heart disease of native coronary artery without angina pectoris: Secondary | ICD-10-CM

## 2019-07-08 DIAGNOSIS — A419 Sepsis, unspecified organism: Secondary | ICD-10-CM | POA: Diagnosis not present

## 2019-07-08 DIAGNOSIS — N186 End stage renal disease: Secondary | ICD-10-CM | POA: Diagnosis not present

## 2019-07-08 DIAGNOSIS — R296 Repeated falls: Secondary | ICD-10-CM

## 2019-07-08 DIAGNOSIS — E785 Hyperlipidemia, unspecified: Secondary | ICD-10-CM | POA: Insufficient documentation

## 2019-07-08 DIAGNOSIS — R5383 Other fatigue: Secondary | ICD-10-CM | POA: Diagnosis not present

## 2019-07-08 DIAGNOSIS — I5082 Biventricular heart failure: Secondary | ICD-10-CM | POA: Diagnosis not present

## 2019-07-08 DIAGNOSIS — M79604 Pain in right leg: Secondary | ICD-10-CM

## 2019-07-08 DIAGNOSIS — I5042 Chronic combined systolic (congestive) and diastolic (congestive) heart failure: Secondary | ICD-10-CM | POA: Diagnosis not present

## 2019-07-08 DIAGNOSIS — Z992 Dependence on renal dialysis: Secondary | ICD-10-CM | POA: Diagnosis not present

## 2019-07-08 DIAGNOSIS — E1122 Type 2 diabetes mellitus with diabetic chronic kidney disease: Secondary | ICD-10-CM | POA: Diagnosis not present

## 2019-07-08 DIAGNOSIS — I132 Hypertensive heart and chronic kidney disease with heart failure and with stage 5 chronic kidney disease, or end stage renal disease: Secondary | ICD-10-CM | POA: Diagnosis not present

## 2019-07-08 NOTE — Progress Notes (Signed)
Advanced Heart Failure Clinic Note   Patient ID: [name], male   DOB: June 22, 1951, 68 y.o.   MRN: [id number]  PCP: Dr [name] Nephrologist: Dr Mercy Moore HF: Dr. [name]   HPI: [name] is a 68 y.o. male with PMH: obesity, DM, COPD on nighttime oxygen, sleep apnea on CPAP, ESRD- HD, CAD, S/P Taxus drug-eluting stent to the right coronary in September 5284, chronic systolic/diastolic heart failure (Unable to tolerate statins so placed on Welchol), prostate CA s/p XRT   Evaluated at [facility] for kidney transplant but he was not felt to be candidate (11/2012) due to poor mobility, DM, and coronary disease.    In 11/17 had NSTEMI underwent PCI LAD. RCA had long 80-90% but felt to be not amenable to PCI due to tortuosity. Admitted in 1/18 with SVT and small NSTEMI. Cath films reviewed again with Dr. [place] and RCA not approachable percutaneously.   Admitted 10/18 with rectal bleeding felt due to severe radiation proctitis with ulcer on flex sig. Plavix stopped. Remains on ASA 81.   Admitted 01/15/18 with sharp chest pain and left-sided chest pressure after dialysis. Trop negative. Scheduled for Myoview which was unremarkable/low risk as below.   Admitted in 6/20 with e.coli bactermia. MRCP showed diffuse biliary ductal dilatation with biliary stones. He underwent ERCP with subsequent biliary sphincterotomy, removal of stones and plastic stent placement in common bile duct. TEE 7/20 EF 60-65% no vegetation.   Was seen in PCP office on 07/03/19 with weakness. ECG obtained and was read as AFL (it was actually NSR) and patient sent emergently to ER. Waited 11 hours but not seen so left ER. PCP referred emergently to [place].  He presents today for add-on visit. Here with his wife. Feels very weak and no energy. Seems like it is worse over past few weeks. No fevers. Gets cold frequently. No bleeding. Has been off midodrine. Occasionally CP but nothing severe. Can barely walk across the room due to  weakness. Has HHPT coming out.   ECG: NSR 95 Personally reviewed    Studies:  Myoview 01/22/18: EF 44%, No ischemia or infarction.   cMRI 02/19/13: EF 37% dilated LV. diffuse HK   Review of systems complete and found to be negative unless listed in HPI.   Past Medical History:  Diagnosis Date  . Acute encephalopathy 10/04/2018  . Allergic rhinitis, cause unspecified 02/24/2014  . Anemia 06/16/2011  . BENIGN PROSTATIC HYPERTROPHY 10/14/2009  . CAD, NATIVE VESSEL 02/06/2009   a. 06/2007 s/p Taxus DES to the RCA;  b. 08/2016 NSTEMI in setting of SVT/PCI: LM 30ost, LAD 51m (3.0x16 Synergy DES), LCX 35m, OM1 60, RI 40, RCA 70p/m, 58m - not amenable to PCI.  [name] Cervical radiculopathy, chronic 02/23/2016   Right c5-6 by NCS/EMG  . Chest pain 08/09/2015  . Chronic combined systolic (congestive) and diastolic (congestive) heart failure ([place])    a. 10/2016 Echo: EF 40-45%, Gr2 DD. mildly dil LA.  [name] COLONIC POLYPS, HX OF 10/14/2009  . Dementia ([place]) [id number]  . Depression 09/24/2015  . DIABETES MELLITUS, TYPE II 02/01/2010  . DYSLIPIDEMIA 06/18/2007  . ESRD (end stage renal disease) on dialysis ([place]) 08/04/2010   ESRD due to DM adn HTN, started HD in 2011. As of 2019 has a R arm graft and gets HD on TTS sched  . FOOT PAIN 08/12/2008  . GAIT DISTURBANCE 03/03/2010  . GASTROENTERITIS, VIRAL 10/14/2009  . GERD 06/18/2007  . GOITER, MULTINODULAR 12/26/2007  . GOUT 06/18/2007  .  GYNECOMASTIA 07/17/2010  . Hemodialysis access, fistula mature [facility])    Dialysis T-Th-Sa ([place]) Right upper arm fistula  . Hyperlipidemia 10/16/2011  . Hyperparathyroidism, secondary ([place]) 06/16/2011  . HYPERTENSION 06/18/2007  . Hyperthyroidism   . Hypocalcemia 06/07/2010  . Ischemic cardiomyopathy    a. 10/2016 Echo: EF 40-45%.  . Lumbar stenosis with neurogenic claudication   . ONYCHOMYCOSIS, TOENAILS 12/26/2007  . OSA on CPAP 10/16/2011  . Other malaise and fatigue 11/24/2009  . PERIPHERAL NEUROPATHY  06/18/2007  . Prostate cancer ([place])   . PSVT (paroxysmal supraventricular tachycardia) ([place])    a. 74/2595 complicated by NSTEMI;  b. 11/2016 Treated w/ adenosine in ED;  c. 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  [name] PULMONARY NODULE, RIGHT LOWER LOBE 06/08/2009  . Sleep apnea    cpap machine and o2  . TRANSAMINASES, SERUM, ELEVATED 02/01/2010  . Transfusion history    none recent  . Unspecified hypotension 01/30/2010   Current Outpatient Medications  Medication Sig Dispense Refill  . acetaminophen (TYLENOL) 325 MG tablet Take 325 mg by mouth every 6 (six) hours as needed for mild pain or headache.     . Alirocumab (PRALUENT) 150 MG/ML SOAJ Inject 1 pen into the skin every 14 (fourteen) days. 6 pen 3  . allopurinol (ZYLOPRIM) 100 MG tablet Take 1 tablet by mouth once daily 90 tablet 0  . aspirin EC 325 MG tablet Take 1 tablet (325 mg total) by mouth daily. 30 tablet 0  . betamethasone dipropionate (DIPROLENE) 0.05 % cream Apply 1 application topically daily as needed (leg sores).    . citalopram (CELEXA) 20 MG tablet Take 20 mg by mouth at bedtime.    . colestipol (COLESTID) 1 g tablet Take 3 tablets (3 g total) by mouth daily. 90 tablet 11  . doxercalciferol ([name]) 4 MCG/2ML injection Inject 1 mL (2 mcg total) into the vein Every Tuesday,Thursday,and Saturday with dialysis. 2 mL 0  . ezetimibe (ZETIA) 10 MG tablet Take 10 mg by mouth every evening.    . ferric citrate (AURYXIA) 1 GM 210 MG(Fe) tablet Take 630 mg by mouth 3 (three) times daily with meals.    . gabapentin (NEURONTIN) 300 MG capsule TAKE 1 TO 2 CAPSULES BY MOUTH AT BEDTIME AS NEEDED FOR PAIN AND FOR SLEEP 180 capsule 0  . hydroxypropyl methylcellulose / hypromellose (ISOPTO TEARS / GONIOVISC) 2.5 % ophthalmic solution Place 1 drop into both eyes 3 (three) times daily as needed for dry eyes. 15 mL 11  . loperamide (IMODIUM) 2 MG capsule Take 2 mg by mouth 4 (four) times daily as needed for diarrhea or loose stools.    . methimazole (TAPAZOLE)  5 MG tablet Take 2.5 mg by mouth at bedtime.    . midodrine (PROAMATINE) 10 MG tablet Take 10mg  every Tuesday, Thursday and Saturday    . multivitamin (RENA-VIT) TABS tablet Take 1 tablet by mouth daily.     . pantoprazole (PROTONIX) 40 MG tablet Take 40 mg by mouth at bedtime.     . polyethylene glycol (MIRALAX / GLYCOLAX) 17 g packet Take 17 g by mouth daily as needed for mild constipation.    . SENSIPAR 90 MG tablet Take 90 mg by mouth 2 (two) times daily.    . traMADol (ULTRAM) 50 MG tablet Take 50 mg by mouth every 6 (six) hours as needed.    . zolpidem (AMBIEN) 5 MG tablet Take 1 tablet (5 mg total) by mouth at bedtime as needed. for  sleep 90 tablet 1   No current facility-administered medications for this encounter.    Vitals:   07/08/19 1112  BP: 126/86  Pulse: 96  SpO2: 96%   Wt Readings from Last 3 Encounters:  07/08/19 102.9 kg (226 lb 12.8 oz)  07/03/19 102.5 kg (226 lb)  06/01/19 103 kg (227 lb)    PHYSICAL EXAM: General:  Weak appearing. No resp difficulty HEENT: normal Neck: supple. no JVD. Carotids 2+ bilat; no bruits. No lymphadenopathy or thryomegaly appreciated. Cor: PMI nondisplaced. Regular rate & rhythm. No rubs, gallops or murmurs. Lungs: clear Abdomen: obese soft, nontender, nondistended. No hepatosplenomegaly. No bruits or masses. Good bowel sounds. Extremities: no cyanosis, clubbing, rash, edema RUE AVF with thrill  Neuro: alert & orientedx3, cranial nerves grossly intact. moves all 4 extremities w/o difficulty. Affect pleasant   ASSESSMENT & PLAN:  1. Chronic Systolic Heart Failure:  Mixed ICM/NICM, EF 37% per c-MRI. EF 25-30% (Echo 6/15)  - EF has normalized  - Echo 1/18 EF 40-45% in 1/18. EF 44% on myoview 12/2017 - Echo 6/20 EF 55-60% - TEE 04/02/19 EF 60-65% Normal RV - NYHA III, confounded by fatigue and deconditioning.  - Volume status stable. Managed with HD. - Off ACE/ARB/ARNI/BB due to low BP with ESRD.   2. Weakness - All ECGs reviewed  from today and previous week. All NSR. No evidence of AFL.  - Doubt this is primarily cardiac  3. CAD - s/p PCI of LAD 11/17.  - Recurrent NSTEMI in 1/18 due to SVT. Cath films reviewed has 80-90% long lesion in RCA but not amenable to PCI due to tortuosity - Myoview 01/22/18: EF 44%, No ischemia or infarction.  - TEE 04/02/19 EF 60-65% Normal RV - Minimal CP. Doubt ischemic - Continue ASA. Has not been able to tolerate statins due to myositis. Continue Zetia and PCSK-9  4. ESRD - HD Tues/Thur/Sat. No change.   5. PSVT - Resolved s/p ablation for AVNRT on 11/26/16 with resolution of symptoms.  - No change.   6. Deconditioning/fatigue - Longstanding & multifactorial due to lack of activity and long-term HD.  - Stressed need for aggressive HHPT - Wearing CPAP qHS  7. Hyperthyroidism - Continue methimazole  8. Gouty arthritis -stable   Glori Bickers, MD  11:27 AM

## 2019-07-08 NOTE — Patient Instructions (Signed)
No changes today!  Your physician recommends that you schedule a follow-up appointment in: 6 months. Our office will call you to schedule your appointment.   At the [facility], you and your health needs are our priority. As part of our continuing mission to provide you with exceptional heart care, we have created designated Provider Care Teams. These Care Teams include your primary Cardiologist (physician) and Advanced Practice Providers (APPs- Physician Assistants and Nurse Practitioners) who all work together to provide you with the care you need, when you need it.   You may see any of the following providers on your designated Care Team at your next follow up: [name] Dr Glori Bickers . Dr Loralie Champagne . Darrick Grinder, NP   Please be sure to bring in all your medications bottles to every appointment.

## 2019-07-08 NOTE — Addendum Note (Signed)
Encounter addended by: Valeda Malm, RN on: 07/08/2019 11:56 AM  Actions taken: Clinical Note Signed

## 2019-07-09 DIAGNOSIS — N2581 Secondary hyperparathyroidism of renal origin: Secondary | ICD-10-CM | POA: Diagnosis not present

## 2019-07-09 DIAGNOSIS — D509 Iron deficiency anemia, unspecified: Secondary | ICD-10-CM | POA: Diagnosis not present

## 2019-07-09 DIAGNOSIS — D631 Anemia in chronic kidney disease: Secondary | ICD-10-CM | POA: Diagnosis not present

## 2019-07-09 DIAGNOSIS — Z992 Dependence on renal dialysis: Secondary | ICD-10-CM | POA: Diagnosis not present

## 2019-07-09 DIAGNOSIS — N186 End stage renal disease: Secondary | ICD-10-CM | POA: Diagnosis not present

## 2019-07-09 DIAGNOSIS — E119 Type 2 diabetes mellitus without complications: Secondary | ICD-10-CM | POA: Diagnosis not present

## 2019-07-10 ENCOUNTER — Other Ambulatory Visit: Payer: Self-pay | Admitting: Internal Medicine

## 2019-07-10 DIAGNOSIS — I739 Peripheral vascular disease, unspecified: Secondary | ICD-10-CM

## 2019-07-10 DIAGNOSIS — I871 Compression of vein: Secondary | ICD-10-CM | POA: Diagnosis not present

## 2019-07-10 DIAGNOSIS — N186 End stage renal disease: Secondary | ICD-10-CM | POA: Diagnosis not present

## 2019-07-10 DIAGNOSIS — T82858A Stenosis of vascular prosthetic devices, implants and grafts, initial encounter: Secondary | ICD-10-CM | POA: Diagnosis not present

## 2019-07-10 DIAGNOSIS — Z992 Dependence on renal dialysis: Secondary | ICD-10-CM | POA: Diagnosis not present

## 2019-07-11 DIAGNOSIS — N2581 Secondary hyperparathyroidism of renal origin: Secondary | ICD-10-CM | POA: Diagnosis not present

## 2019-07-11 DIAGNOSIS — N186 End stage renal disease: Secondary | ICD-10-CM | POA: Diagnosis not present

## 2019-07-11 DIAGNOSIS — E119 Type 2 diabetes mellitus without complications: Secondary | ICD-10-CM | POA: Diagnosis not present

## 2019-07-11 DIAGNOSIS — D509 Iron deficiency anemia, unspecified: Secondary | ICD-10-CM | POA: Diagnosis not present

## 2019-07-11 DIAGNOSIS — Z992 Dependence on renal dialysis: Secondary | ICD-10-CM | POA: Diagnosis not present

## 2019-07-11 DIAGNOSIS — D631 Anemia in chronic kidney disease: Secondary | ICD-10-CM | POA: Diagnosis not present

## 2019-07-13 ENCOUNTER — Other Ambulatory Visit: Payer: Self-pay | Admitting: Endocrinology

## 2019-07-13 DIAGNOSIS — I2510 Atherosclerotic heart disease of native coronary artery without angina pectoris: Secondary | ICD-10-CM | POA: Diagnosis not present

## 2019-07-13 DIAGNOSIS — F329 Major depressive disorder, single episode, unspecified: Secondary | ICD-10-CM | POA: Diagnosis not present

## 2019-07-13 DIAGNOSIS — I252 Old myocardial infarction: Secondary | ICD-10-CM | POA: Diagnosis not present

## 2019-07-13 DIAGNOSIS — Z86010 Personal history of colonic polyps: Secondary | ICD-10-CM | POA: Diagnosis not present

## 2019-07-13 DIAGNOSIS — M48062 Spinal stenosis, lumbar region with neurogenic claudication: Secondary | ICD-10-CM | POA: Diagnosis not present

## 2019-07-13 DIAGNOSIS — Z87891 Personal history of nicotine dependence: Secondary | ICD-10-CM | POA: Diagnosis not present

## 2019-07-13 DIAGNOSIS — Z8673 Personal history of transient ischemic attack (TIA), and cerebral infarction without residual deficits: Secondary | ICD-10-CM | POA: Diagnosis not present

## 2019-07-13 DIAGNOSIS — I5042 Chronic combined systolic (congestive) and diastolic (congestive) heart failure: Secondary | ICD-10-CM | POA: Diagnosis not present

## 2019-07-13 DIAGNOSIS — I5082 Biventricular heart failure: Secondary | ICD-10-CM | POA: Diagnosis not present

## 2019-07-13 DIAGNOSIS — I132 Hypertensive heart and chronic kidney disease with heart failure and with stage 5 chronic kidney disease, or end stage renal disease: Secondary | ICD-10-CM | POA: Diagnosis not present

## 2019-07-13 DIAGNOSIS — E1151 Type 2 diabetes mellitus with diabetic peripheral angiopathy without gangrene: Secondary | ICD-10-CM | POA: Diagnosis not present

## 2019-07-13 DIAGNOSIS — Z9861 Coronary angioplasty status: Secondary | ICD-10-CM | POA: Diagnosis not present

## 2019-07-13 DIAGNOSIS — D631 Anemia in chronic kidney disease: Secondary | ICD-10-CM | POA: Diagnosis not present

## 2019-07-13 DIAGNOSIS — M5412 Radiculopathy, cervical region: Secondary | ICD-10-CM | POA: Diagnosis not present

## 2019-07-13 DIAGNOSIS — Z452 Encounter for adjustment and management of vascular access device: Secondary | ICD-10-CM | POA: Diagnosis not present

## 2019-07-13 DIAGNOSIS — M109 Gout, unspecified: Secondary | ICD-10-CM | POA: Diagnosis not present

## 2019-07-13 DIAGNOSIS — N186 End stage renal disease: Secondary | ICD-10-CM | POA: Diagnosis not present

## 2019-07-13 DIAGNOSIS — F0280 Dementia in other diseases classified elsewhere without behavioral disturbance: Secondary | ICD-10-CM | POA: Diagnosis not present

## 2019-07-13 DIAGNOSIS — I255 Ischemic cardiomyopathy: Secondary | ICD-10-CM | POA: Diagnosis not present

## 2019-07-13 DIAGNOSIS — E039 Hypothyroidism, unspecified: Secondary | ICD-10-CM | POA: Diagnosis not present

## 2019-07-13 DIAGNOSIS — A419 Sepsis, unspecified organism: Secondary | ICD-10-CM | POA: Diagnosis not present

## 2019-07-13 DIAGNOSIS — Z8546 Personal history of malignant neoplasm of prostate: Secondary | ICD-10-CM | POA: Diagnosis not present

## 2019-07-13 DIAGNOSIS — E1122 Type 2 diabetes mellitus with diabetic chronic kidney disease: Secondary | ICD-10-CM | POA: Diagnosis not present

## 2019-07-13 DIAGNOSIS — E1142 Type 2 diabetes mellitus with diabetic polyneuropathy: Secondary | ICD-10-CM | POA: Diagnosis not present

## 2019-07-13 DIAGNOSIS — J449 Chronic obstructive pulmonary disease, unspecified: Secondary | ICD-10-CM | POA: Diagnosis not present

## 2019-07-14 DIAGNOSIS — N186 End stage renal disease: Secondary | ICD-10-CM | POA: Diagnosis not present

## 2019-07-14 DIAGNOSIS — D509 Iron deficiency anemia, unspecified: Secondary | ICD-10-CM | POA: Diagnosis not present

## 2019-07-14 DIAGNOSIS — D631 Anemia in chronic kidney disease: Secondary | ICD-10-CM | POA: Diagnosis not present

## 2019-07-14 DIAGNOSIS — Z992 Dependence on renal dialysis: Secondary | ICD-10-CM | POA: Diagnosis not present

## 2019-07-14 DIAGNOSIS — E119 Type 2 diabetes mellitus without complications: Secondary | ICD-10-CM | POA: Diagnosis not present

## 2019-07-14 DIAGNOSIS — N2581 Secondary hyperparathyroidism of renal origin: Secondary | ICD-10-CM | POA: Diagnosis not present

## 2019-07-15 DIAGNOSIS — I5042 Chronic combined systolic (congestive) and diastolic (congestive) heart failure: Secondary | ICD-10-CM | POA: Diagnosis not present

## 2019-07-15 DIAGNOSIS — A419 Sepsis, unspecified organism: Secondary | ICD-10-CM | POA: Diagnosis not present

## 2019-07-15 DIAGNOSIS — I132 Hypertensive heart and chronic kidney disease with heart failure and with stage 5 chronic kidney disease, or end stage renal disease: Secondary | ICD-10-CM | POA: Diagnosis not present

## 2019-07-15 DIAGNOSIS — N186 End stage renal disease: Secondary | ICD-10-CM | POA: Diagnosis not present

## 2019-07-15 DIAGNOSIS — I5082 Biventricular heart failure: Secondary | ICD-10-CM | POA: Diagnosis not present

## 2019-07-15 DIAGNOSIS — E1122 Type 2 diabetes mellitus with diabetic chronic kidney disease: Secondary | ICD-10-CM | POA: Diagnosis not present

## 2019-07-15 IMAGING — US US GUIDANCE NEEDLE PLACEMENT
1 series · 12 of 12 positions shown · non-contrast
Comparison: none

INDICATION: Recent bacteremia, left wrist pain and swelling

[Series 1: us guidance needle placement · 12 of 12 slices shown]
[im 1/12]
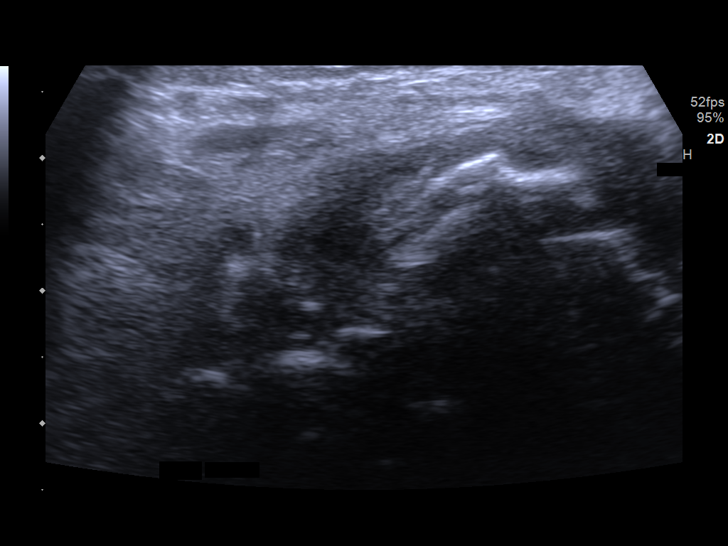
[im 2/12]
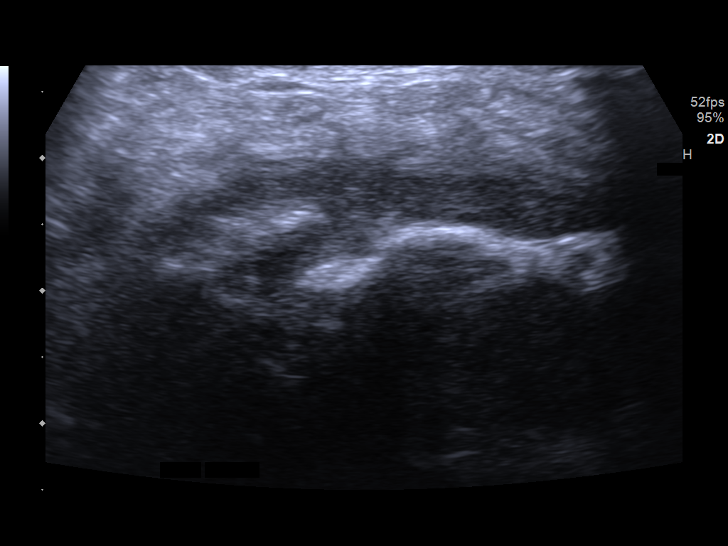
[im 3/12]
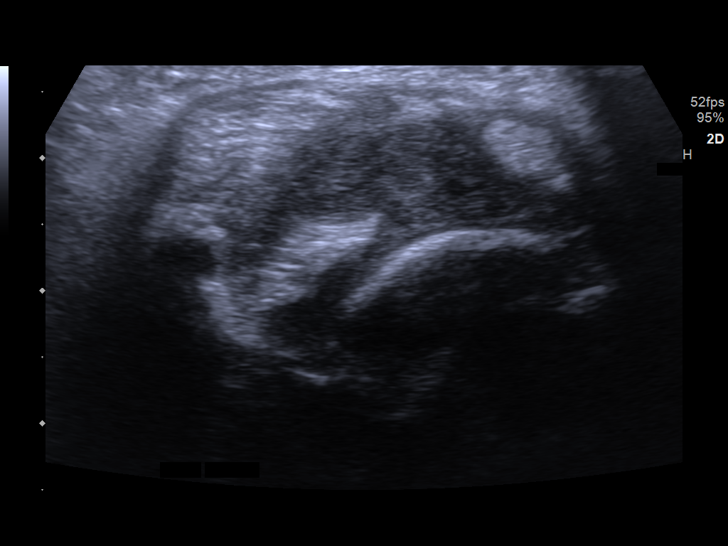
[im 4/12]
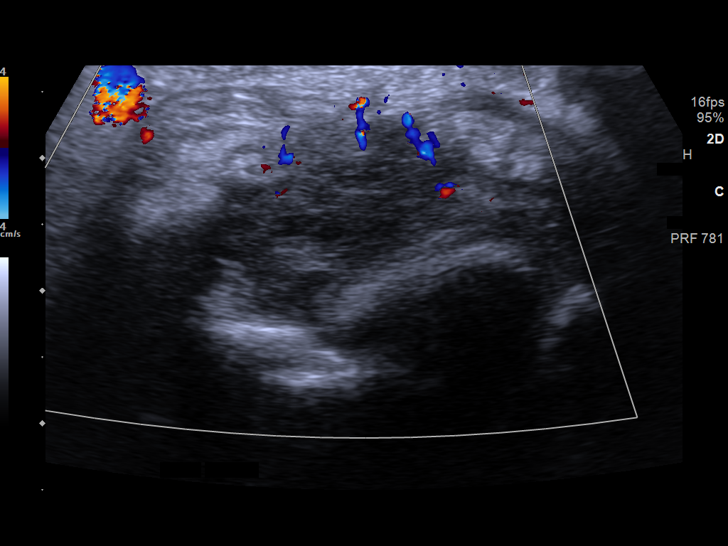
[im 5/12]
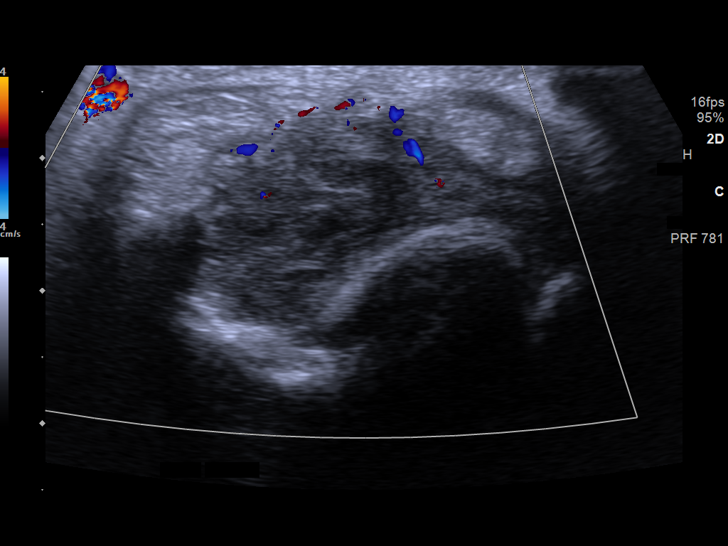
[im 6/12]
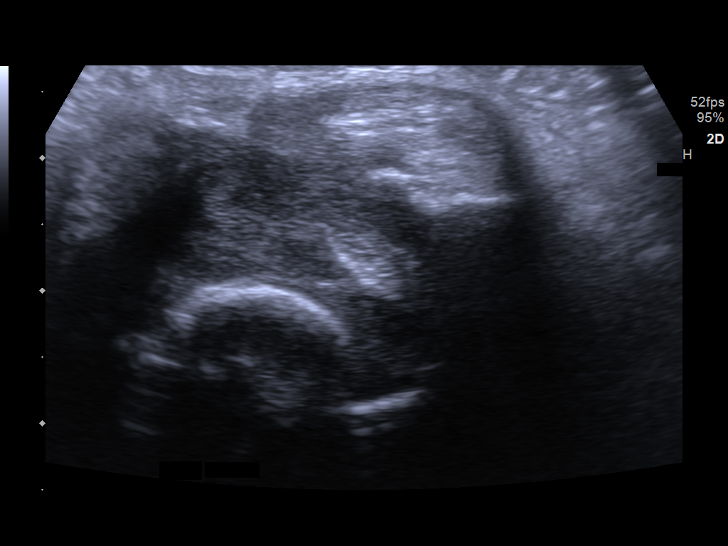
[im 7/12]
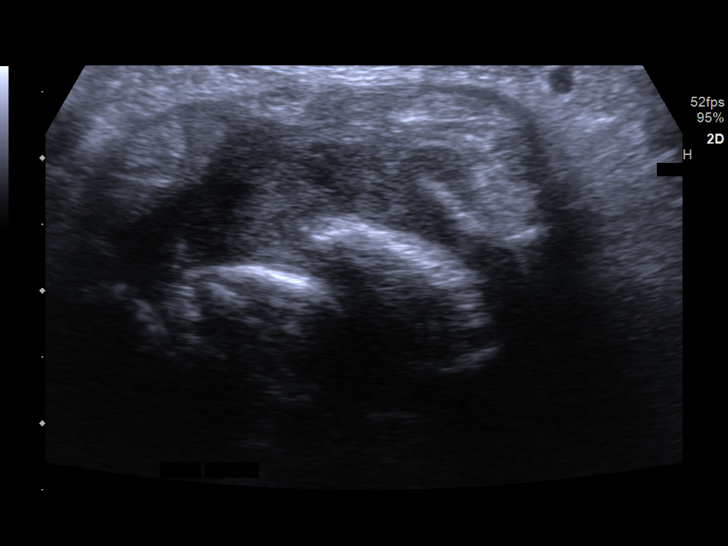
[im 8/12]
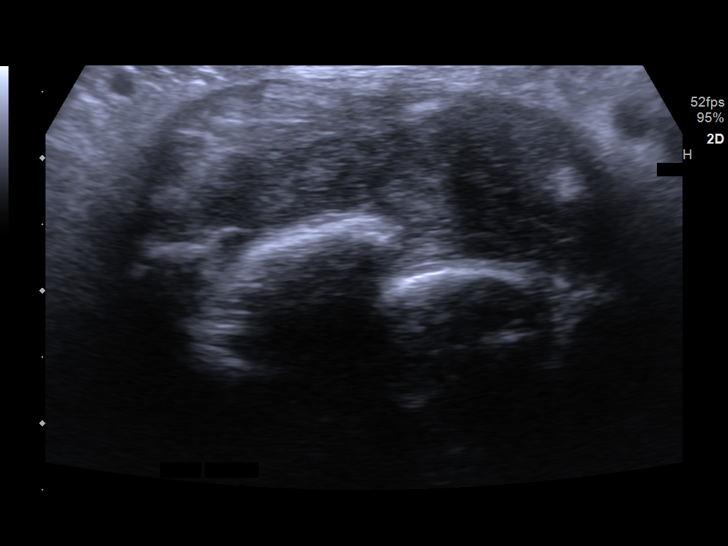
[im 9/12]
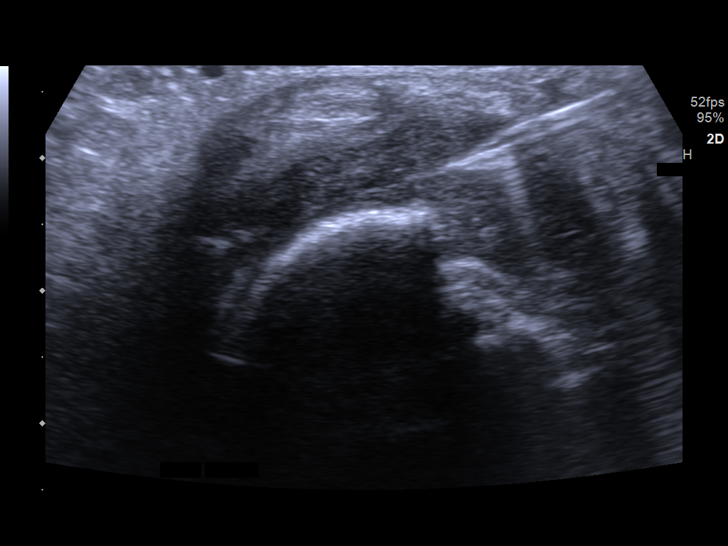
[im 10/12]
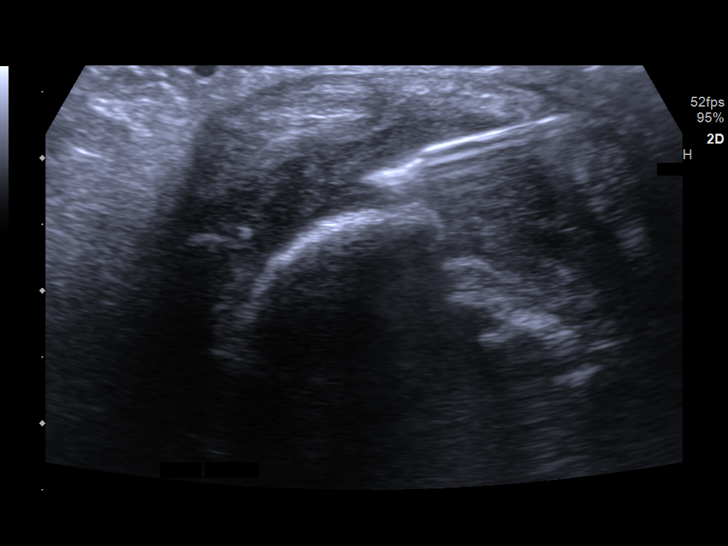
[im 11/12]
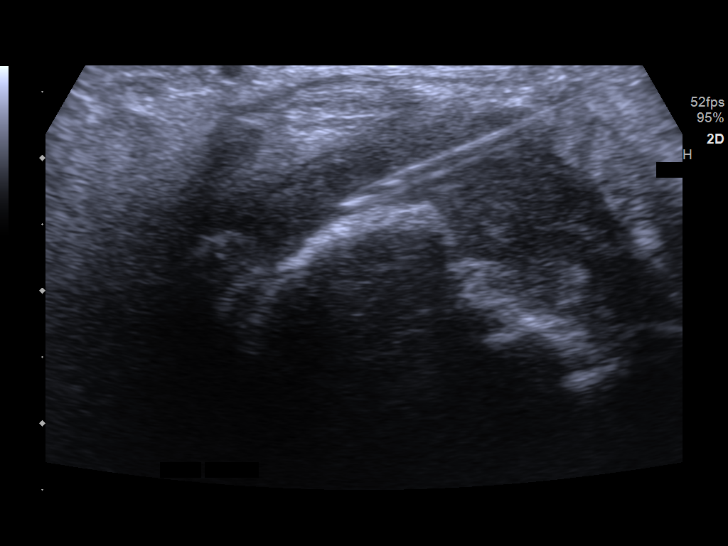
[im 12/12]
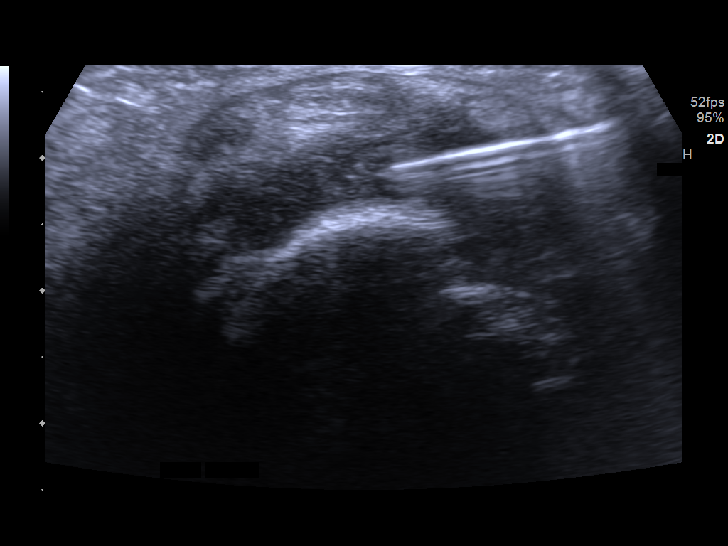

[12 of 12 positions shown; findings below may reference images not displayed]

EXAM:
Ultrasound left wrist aspiration

MEDICATIONS:
1% lidocaine local

ANESTHESIA/SEDATION:
Moderate Sedation Time:  None.

The patient was continuously monitored during the procedure by the
interventional radiology nurse under my direct supervision.

COMPLICATIONS:
None immediate.

PROCEDURE:
Informed written consent was obtained from the patient after a
thorough discussion of the procedural risks, benefits and
alternatives. All questions were addressed. Maximal Sterile Barrier
Technique was utilized including caps, mask, sterile gowns, sterile
gloves, sterile drape, hand hygiene and skin antiseptic. A timeout
was performed prior to the initiation of the procedure.

Left wrist x-ray reviewed from 08/27/2018.

Preliminary ultrasound performed of the left wrist dorsally.
Overlying radiocarpal joint was localized. Mild soft tissue and
synovial prominence in this region. No large simple fluid
collection.

Under ultrasound guidance, an 18 gauge needle was advanced into the
left wrist joint overlying the radiocarpal joint. Needle position
confirmed with ultrasound. Images obtained for documentation. Trace
clear synovial fluid aspirated. Sample diluted with saline and sent
for laboratory analysis including g stain and culture. Postprocedure
imaging demonstrates no hemorrhage or hematoma. Patient tolerated
the procedure well.
IMPRESSION: Status post ultrasound left wrist joint aspiration. Trace synovial
fluid aspirated. No purulence component to suggest septic arthritis.

## 2019-07-16 DIAGNOSIS — E119 Type 2 diabetes mellitus without complications: Secondary | ICD-10-CM | POA: Diagnosis not present

## 2019-07-16 DIAGNOSIS — D631 Anemia in chronic kidney disease: Secondary | ICD-10-CM | POA: Diagnosis not present

## 2019-07-16 DIAGNOSIS — D509 Iron deficiency anemia, unspecified: Secondary | ICD-10-CM | POA: Diagnosis not present

## 2019-07-16 DIAGNOSIS — N186 End stage renal disease: Secondary | ICD-10-CM | POA: Diagnosis not present

## 2019-07-16 DIAGNOSIS — N2581 Secondary hyperparathyroidism of renal origin: Secondary | ICD-10-CM | POA: Diagnosis not present

## 2019-07-16 DIAGNOSIS — Z992 Dependence on renal dialysis: Secondary | ICD-10-CM | POA: Diagnosis not present

## 2019-07-18 DIAGNOSIS — N186 End stage renal disease: Secondary | ICD-10-CM | POA: Diagnosis not present

## 2019-07-18 DIAGNOSIS — E119 Type 2 diabetes mellitus without complications: Secondary | ICD-10-CM | POA: Diagnosis not present

## 2019-07-18 DIAGNOSIS — D631 Anemia in chronic kidney disease: Secondary | ICD-10-CM | POA: Diagnosis not present

## 2019-07-18 DIAGNOSIS — Z992 Dependence on renal dialysis: Secondary | ICD-10-CM | POA: Diagnosis not present

## 2019-07-18 DIAGNOSIS — D509 Iron deficiency anemia, unspecified: Secondary | ICD-10-CM | POA: Diagnosis not present

## 2019-07-18 DIAGNOSIS — N2581 Secondary hyperparathyroidism of renal origin: Secondary | ICD-10-CM | POA: Diagnosis not present

## 2019-07-20 DIAGNOSIS — N186 End stage renal disease: Secondary | ICD-10-CM | POA: Diagnosis not present

## 2019-07-20 DIAGNOSIS — I132 Hypertensive heart and chronic kidney disease with heart failure and with stage 5 chronic kidney disease, or end stage renal disease: Secondary | ICD-10-CM | POA: Diagnosis not present

## 2019-07-20 DIAGNOSIS — I5042 Chronic combined systolic (congestive) and diastolic (congestive) heart failure: Secondary | ICD-10-CM | POA: Diagnosis not present

## 2019-07-20 DIAGNOSIS — A419 Sepsis, unspecified organism: Secondary | ICD-10-CM | POA: Diagnosis not present

## 2019-07-20 DIAGNOSIS — E1122 Type 2 diabetes mellitus with diabetic chronic kidney disease: Secondary | ICD-10-CM | POA: Diagnosis not present

## 2019-07-20 DIAGNOSIS — I5082 Biventricular heart failure: Secondary | ICD-10-CM | POA: Diagnosis not present

## 2019-07-21 DIAGNOSIS — D631 Anemia in chronic kidney disease: Secondary | ICD-10-CM | POA: Diagnosis not present

## 2019-07-21 DIAGNOSIS — N2581 Secondary hyperparathyroidism of renal origin: Secondary | ICD-10-CM | POA: Diagnosis not present

## 2019-07-21 DIAGNOSIS — E119 Type 2 diabetes mellitus without complications: Secondary | ICD-10-CM | POA: Diagnosis not present

## 2019-07-21 DIAGNOSIS — Z992 Dependence on renal dialysis: Secondary | ICD-10-CM | POA: Diagnosis not present

## 2019-07-21 DIAGNOSIS — D509 Iron deficiency anemia, unspecified: Secondary | ICD-10-CM | POA: Diagnosis not present

## 2019-07-21 DIAGNOSIS — N186 End stage renal disease: Secondary | ICD-10-CM | POA: Diagnosis not present

## 2019-07-22 DIAGNOSIS — I5082 Biventricular heart failure: Secondary | ICD-10-CM | POA: Diagnosis not present

## 2019-07-22 DIAGNOSIS — N186 End stage renal disease: Secondary | ICD-10-CM | POA: Diagnosis not present

## 2019-07-22 DIAGNOSIS — A419 Sepsis, unspecified organism: Secondary | ICD-10-CM | POA: Diagnosis not present

## 2019-07-22 DIAGNOSIS — E1122 Type 2 diabetes mellitus with diabetic chronic kidney disease: Secondary | ICD-10-CM | POA: Diagnosis not present

## 2019-07-22 DIAGNOSIS — I5042 Chronic combined systolic (congestive) and diastolic (congestive) heart failure: Secondary | ICD-10-CM | POA: Diagnosis not present

## 2019-07-22 DIAGNOSIS — I132 Hypertensive heart and chronic kidney disease with heart failure and with stage 5 chronic kidney disease, or end stage renal disease: Secondary | ICD-10-CM | POA: Diagnosis not present

## 2019-07-23 DIAGNOSIS — D631 Anemia in chronic kidney disease: Secondary | ICD-10-CM | POA: Diagnosis not present

## 2019-07-23 DIAGNOSIS — N186 End stage renal disease: Secondary | ICD-10-CM | POA: Diagnosis not present

## 2019-07-23 DIAGNOSIS — D509 Iron deficiency anemia, unspecified: Secondary | ICD-10-CM | POA: Diagnosis not present

## 2019-07-23 DIAGNOSIS — N2581 Secondary hyperparathyroidism of renal origin: Secondary | ICD-10-CM | POA: Diagnosis not present

## 2019-07-23 DIAGNOSIS — Z992 Dependence on renal dialysis: Secondary | ICD-10-CM | POA: Diagnosis not present

## 2019-07-23 DIAGNOSIS — E119 Type 2 diabetes mellitus without complications: Secondary | ICD-10-CM | POA: Diagnosis not present

## 2019-07-24 ENCOUNTER — Encounter (HOSPITAL_COMMUNITY): Payer: Medicare Other

## 2019-07-25 DIAGNOSIS — E119 Type 2 diabetes mellitus without complications: Secondary | ICD-10-CM | POA: Diagnosis not present

## 2019-07-25 DIAGNOSIS — D509 Iron deficiency anemia, unspecified: Secondary | ICD-10-CM | POA: Diagnosis not present

## 2019-07-25 DIAGNOSIS — N186 End stage renal disease: Secondary | ICD-10-CM | POA: Diagnosis not present

## 2019-07-25 DIAGNOSIS — Z992 Dependence on renal dialysis: Secondary | ICD-10-CM | POA: Diagnosis not present

## 2019-07-25 DIAGNOSIS — D631 Anemia in chronic kidney disease: Secondary | ICD-10-CM | POA: Diagnosis not present

## 2019-07-25 DIAGNOSIS — N2581 Secondary hyperparathyroidism of renal origin: Secondary | ICD-10-CM | POA: Diagnosis not present

## 2019-07-25 IMAGING — DX DG CHEST 2V
2 series · 2 of 2 positions shown · non-contrast
Comparison: 08/23/2018

CLINICAL DATA: Altered mental status

EXAM:
CHEST - 2 VIEW

[chest ap]
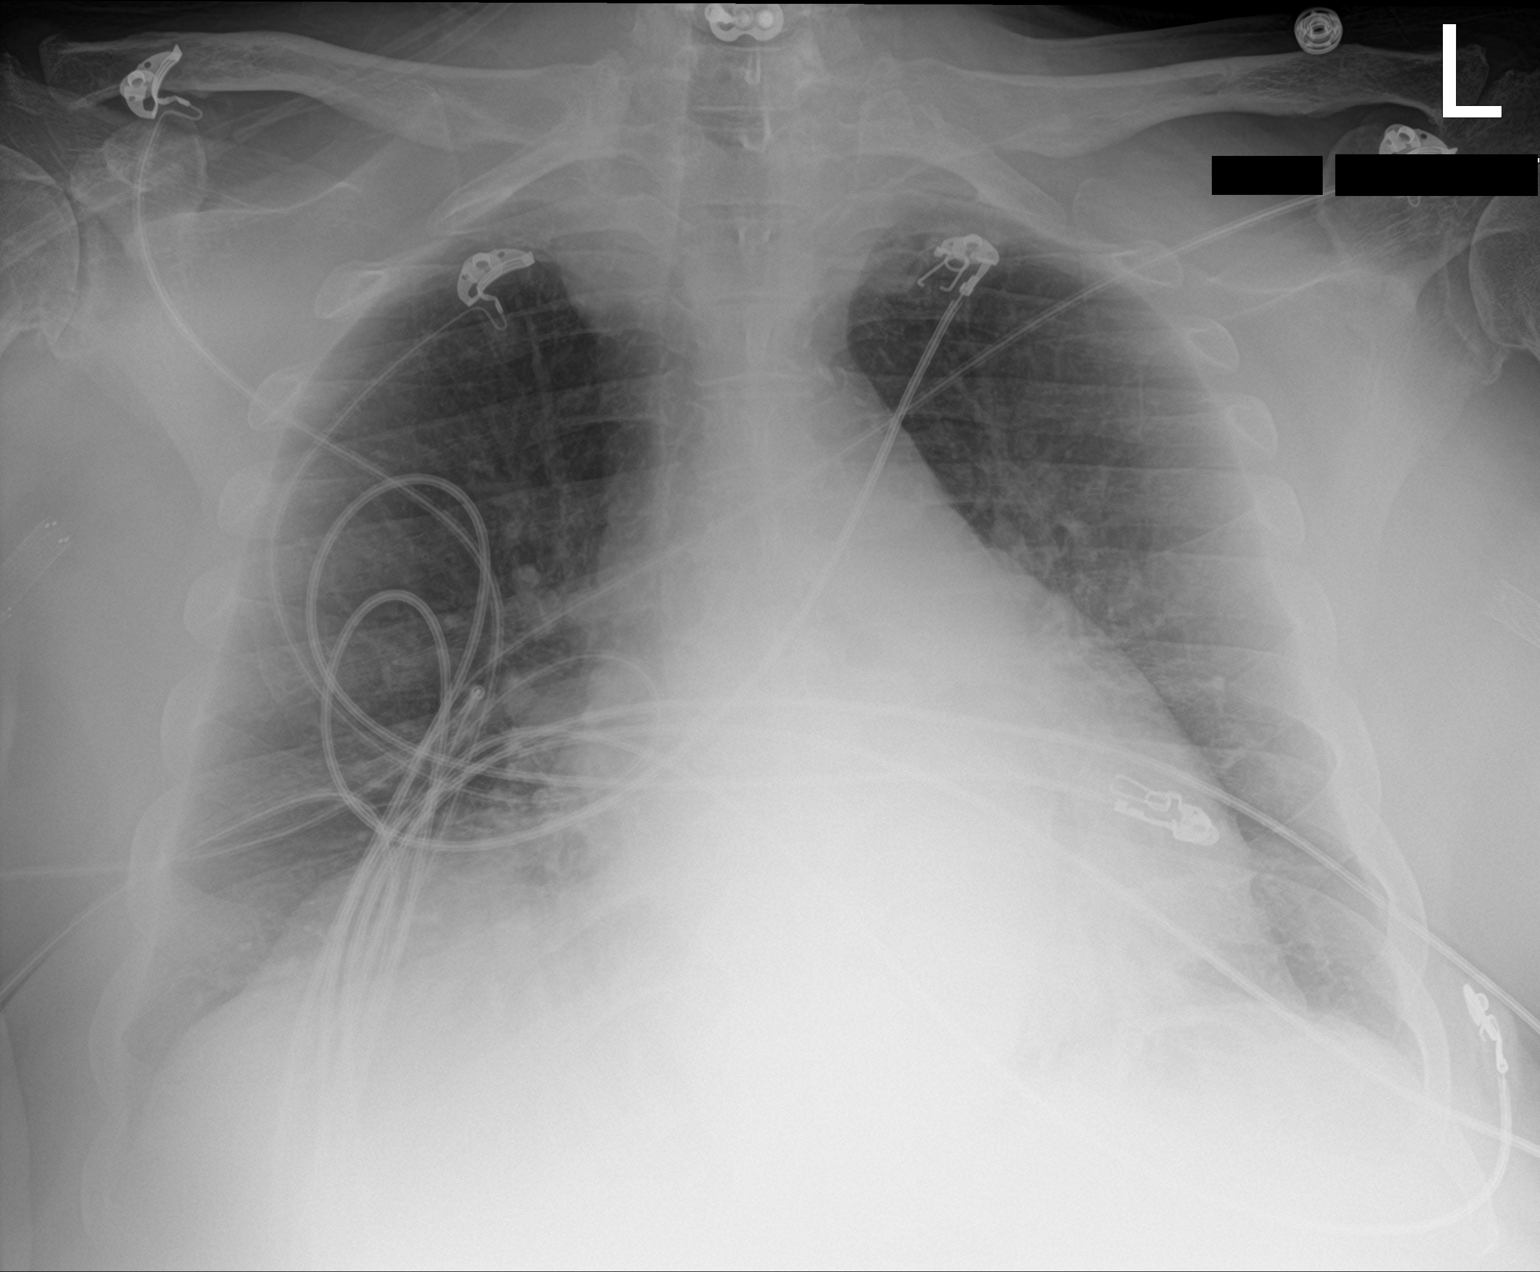

[chest lat]
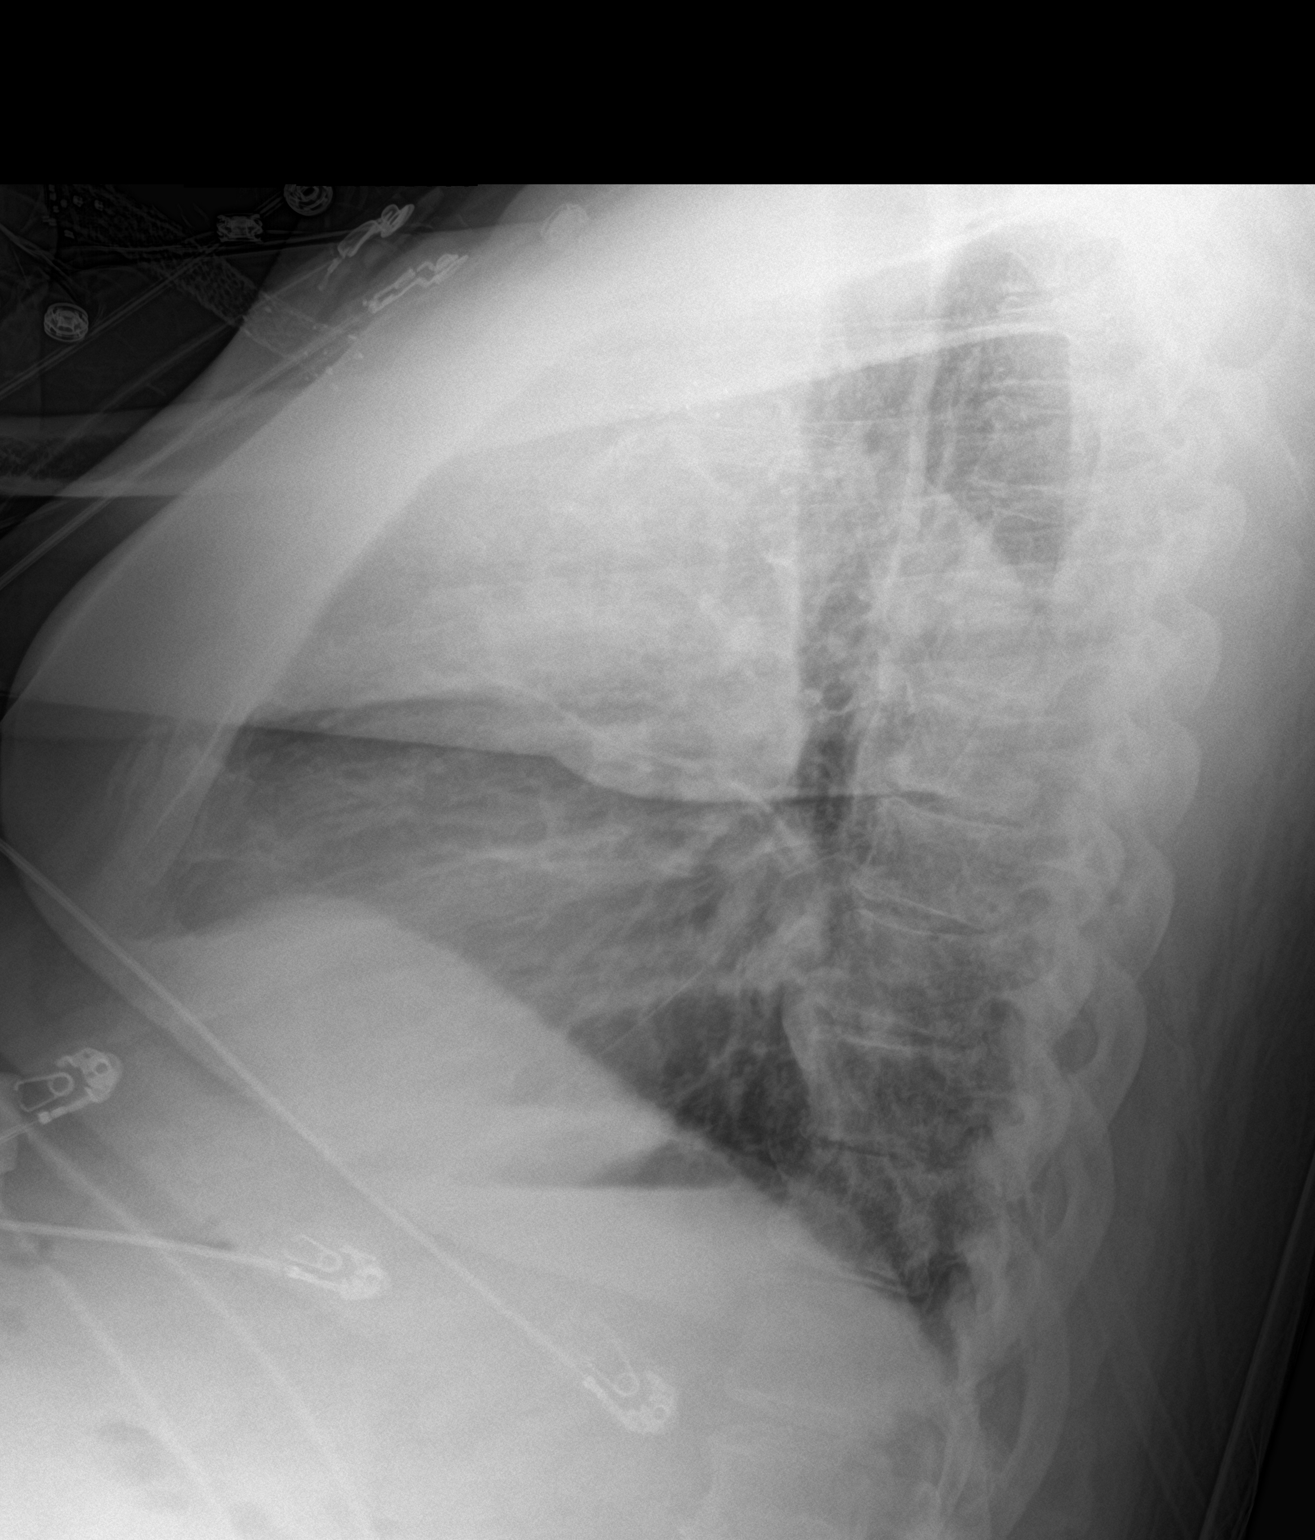

[2 of 2 positions shown; findings below may reference images not displayed]

FINDINGS: Mild cardiomegaly. No acute consolidation or effusion. No
pneumothorax. Postsurgical changes in the cervical spine.
IMPRESSION: No active cardiopulmonary disease.  Cardiomegaly.

## 2019-07-26 IMAGING — MR MR MRA HEAD W/O CM
1 series · 19 of 48 positions shown · non-contrast
Comparison: MRI same day.

CLINICAL DATA: Follow-up acute stroke. 6 mm right cerebellar
infarction.

EXAM:
MRA HEAD WITHOUT CONTRAST
TECHNIQUE: Angiographic images of the Circle of Willis were obtained using MRA
technique without intravenous contrast.

[Series 3: ax (id) 2 · axial · 1.0mm · 0.43mm/px · z∈[-103,-16]mm · 19 of 184 slices shown]
[im 1/184]
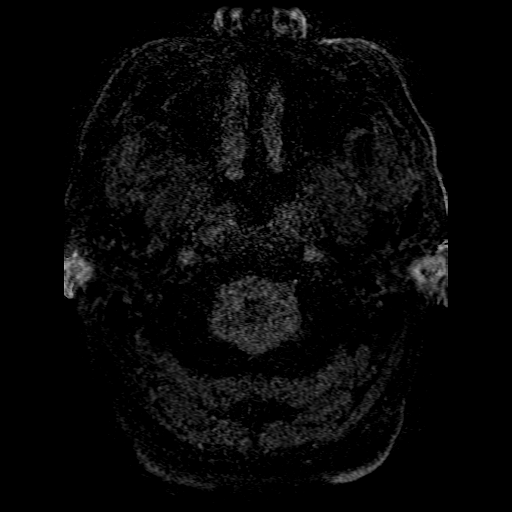
[im 4/184]
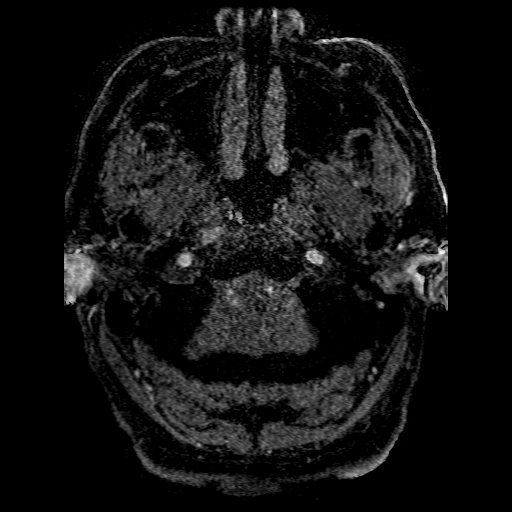
[im 8/184]
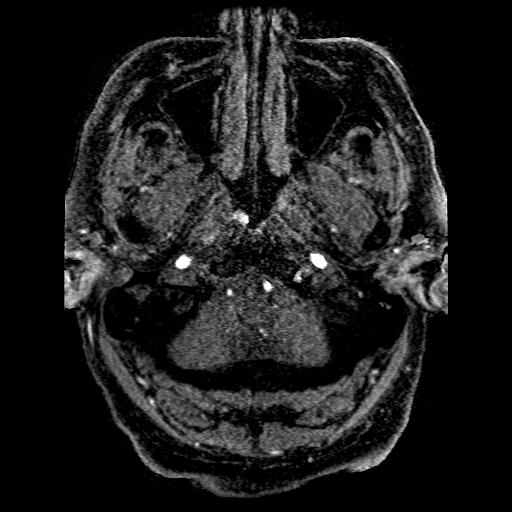
[im 12/184]
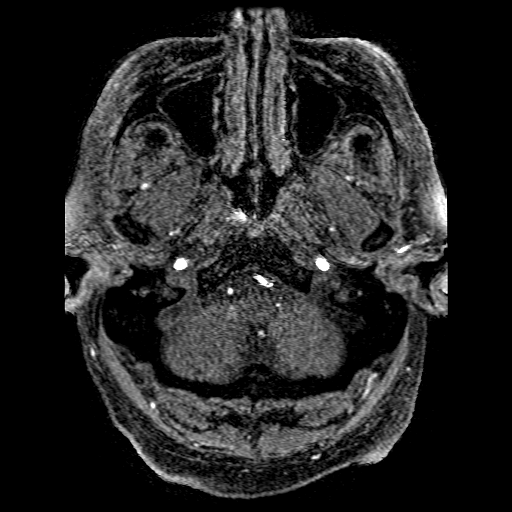
[im 16/184]
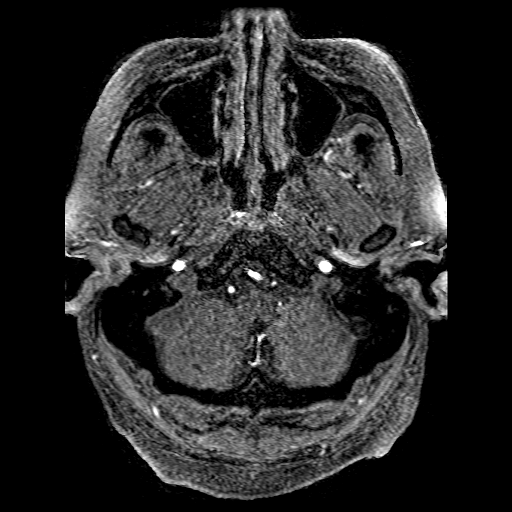
[im 20/184]
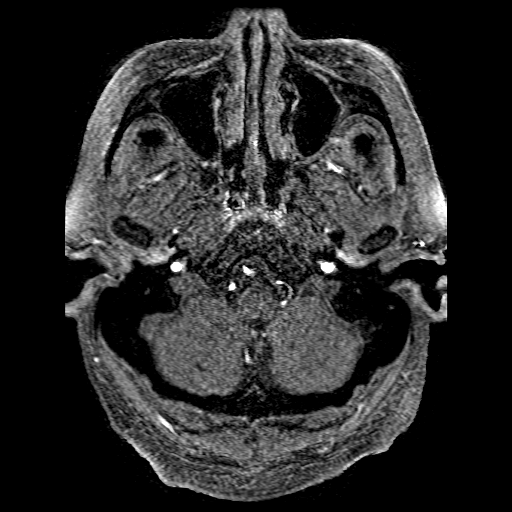
[im 24/184]
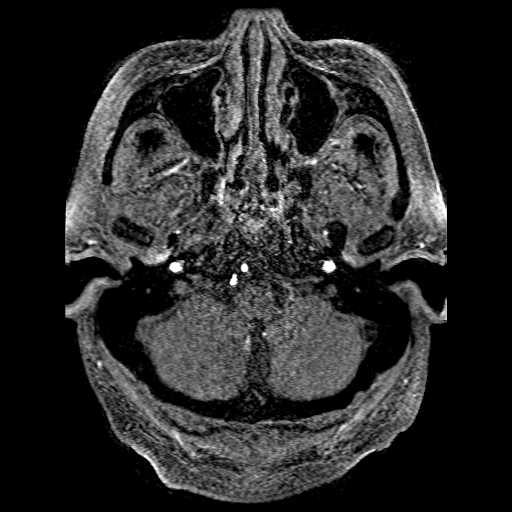
[im 28/184]
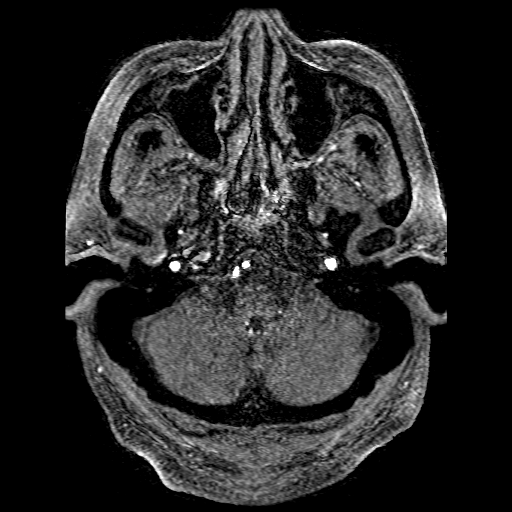
[im 32/184]
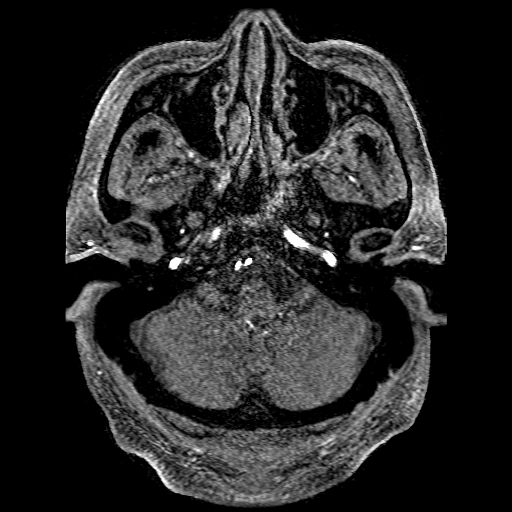
[im 36/184]
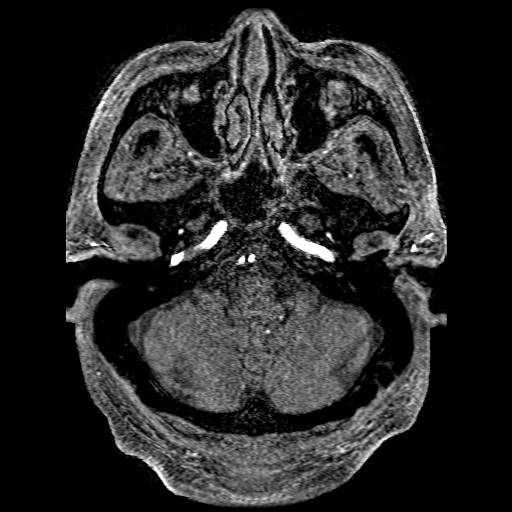
[im 39/184]
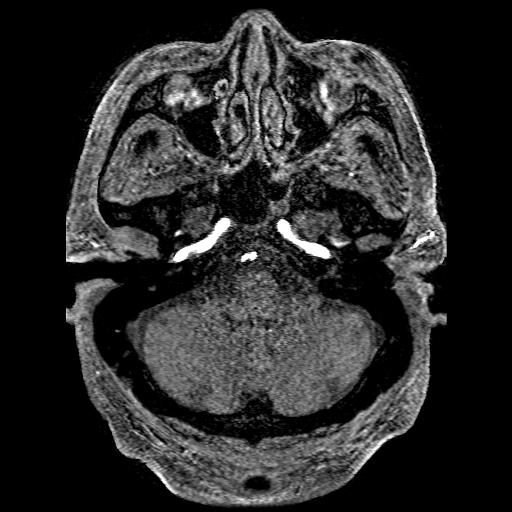
[im 59/184]
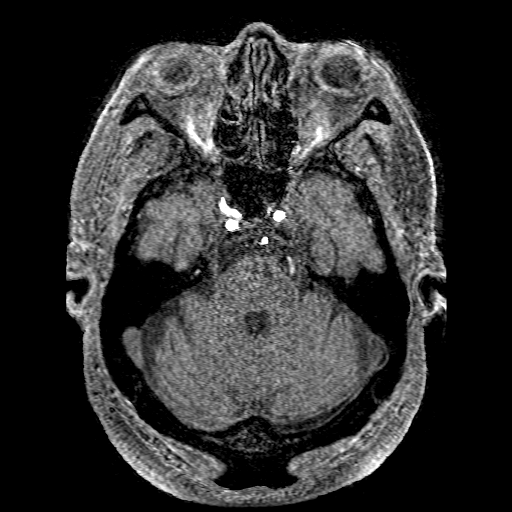
[im 82/184]
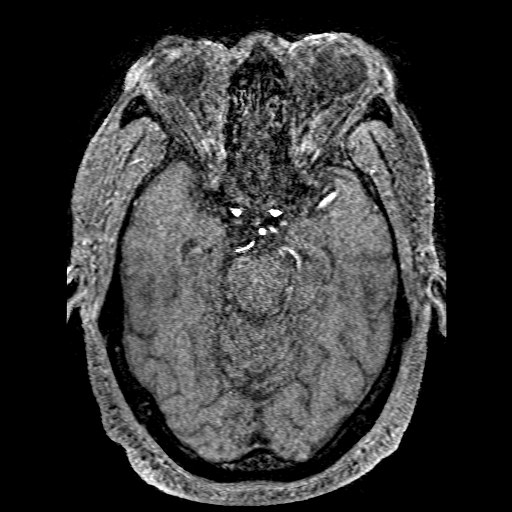
[im 94/184]
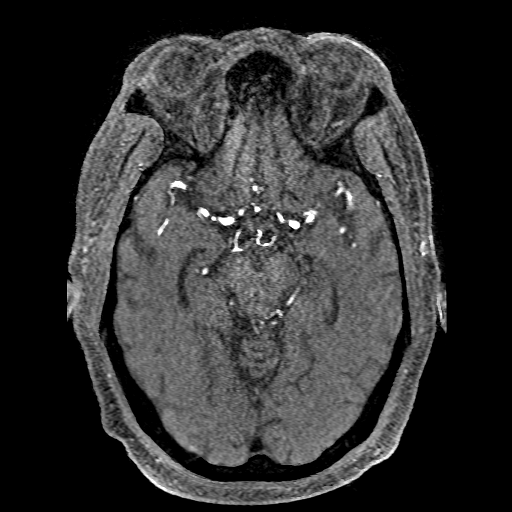
[im 106/184]
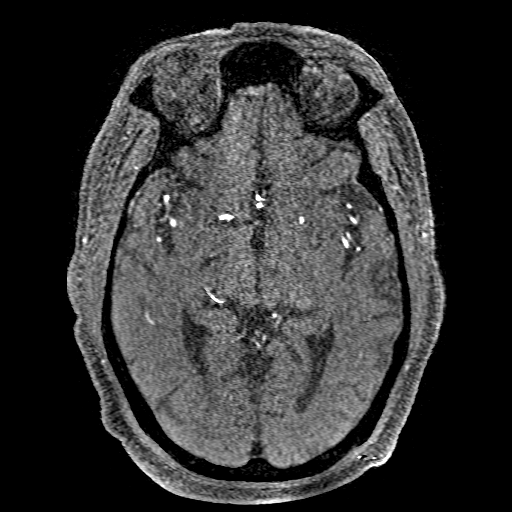
[im 129/184]
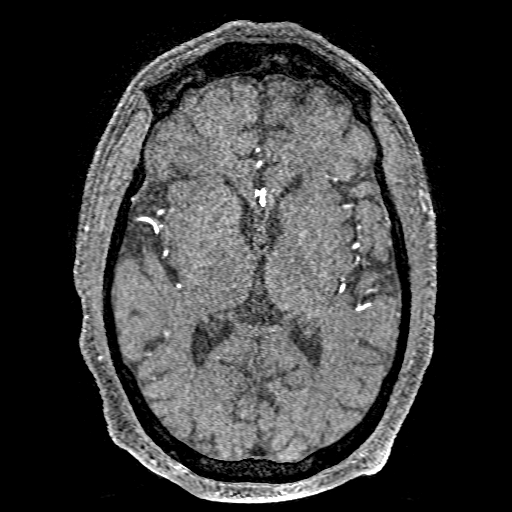
[im 152/184]
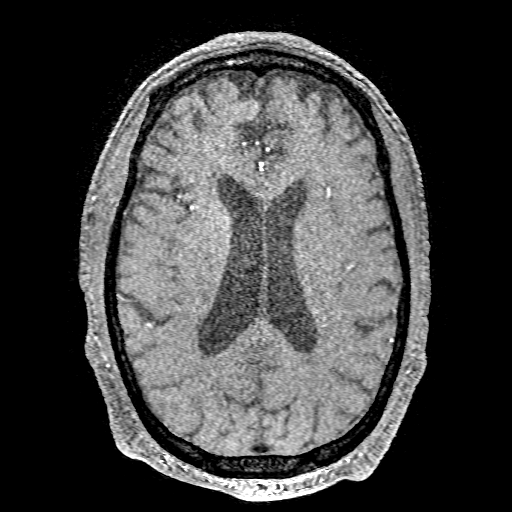
[im 156/184]
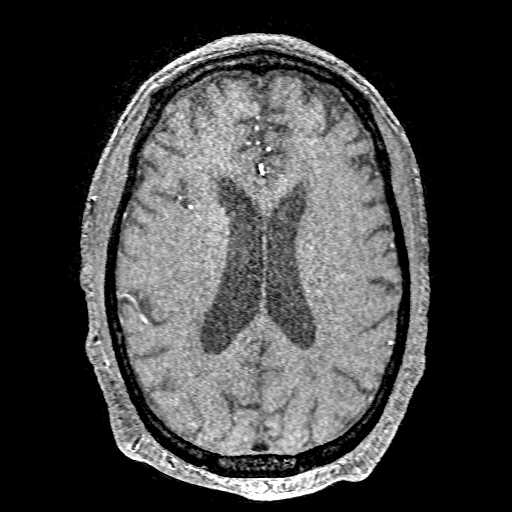
[im 176/184]
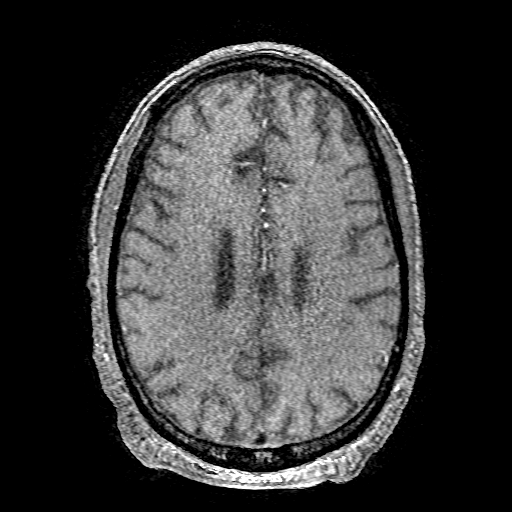

[19 of 48 positions shown; findings below may reference images not displayed]

FINDINGS: Both internal carotid arteries are patent through the skull base and
siphon regions. There is atherosclerotic irregularity in the carotid
siphon regions but no flow limiting stenosis. The anterior and
middle cerebral vessels are patent without proximal stenosis,
aneurysm or vascular malformation. More distal branch vessels show
some atherosclerotic irregularity.

Both vertebral arteries are patent to the basilar. No basilar
stenosis. Posterior circulation branch vessels are patent. Posterior
cerebral artery in superior cerebellar artery branches distally show
some atherosclerotic irregularity.
IMPRESSION: No large or medium vessel occlusion or correctable proximal
stenosis. Distal branch vessel atherosclerotic narrowing and
irregularity seen throughout, more prominent within the superior
cerebellar and posterior cerebral branches.

## 2019-07-26 IMAGING — US US ABDOMEN LIMITED
1 series · 14 of 25 positions shown · non-contrast
Comparison: CT abdomen and pelvis earlier today.

CLINICAL DATA: History of hepatitis.

EXAM:
ULTRASOUND ABDOMEN LIMITED RIGHT UPPER QUADRANT

[Series 1: us abdomen limited · 14 of 41 slices shown]
[im 1/41]
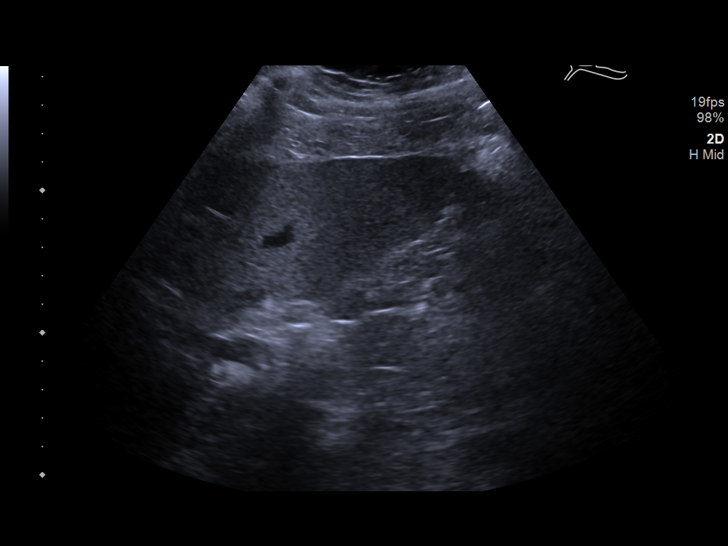
[im 4/41]
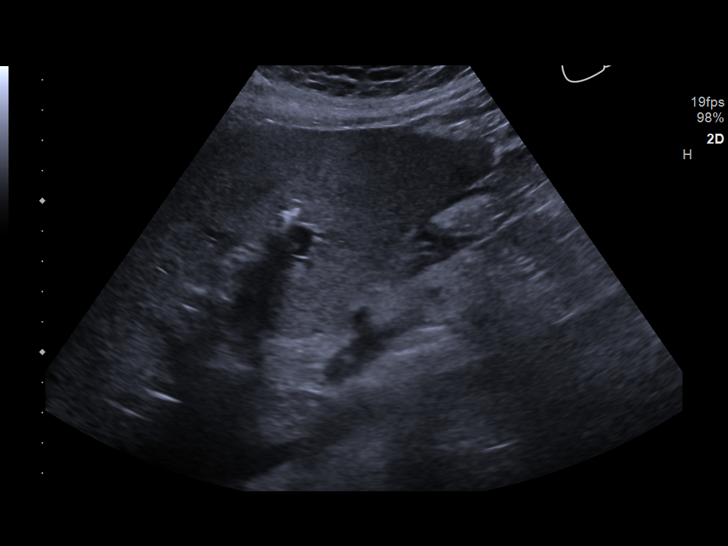
[im 7/41]
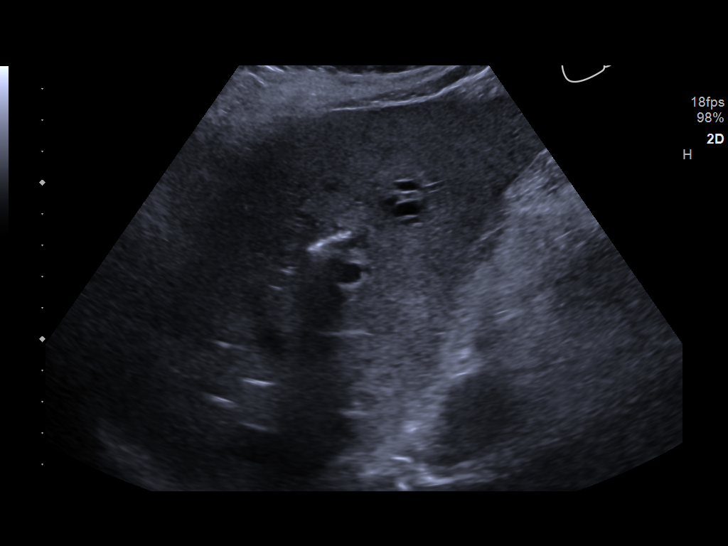
[im 11/41]
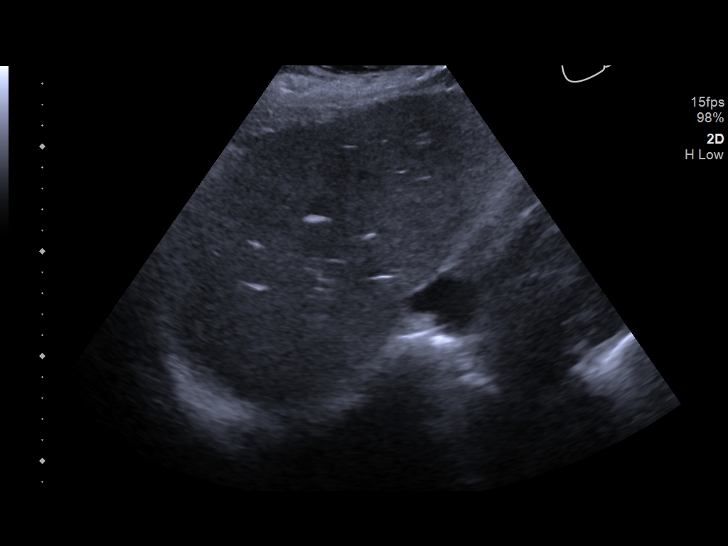
[im 14/41]
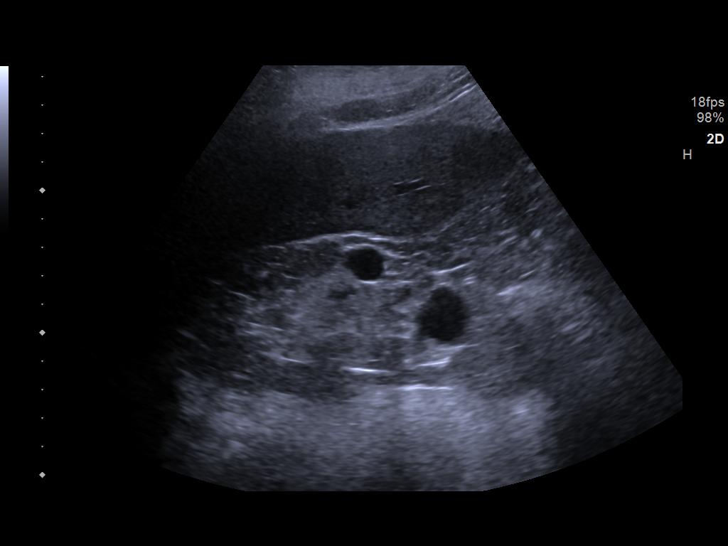
[im 16/41]
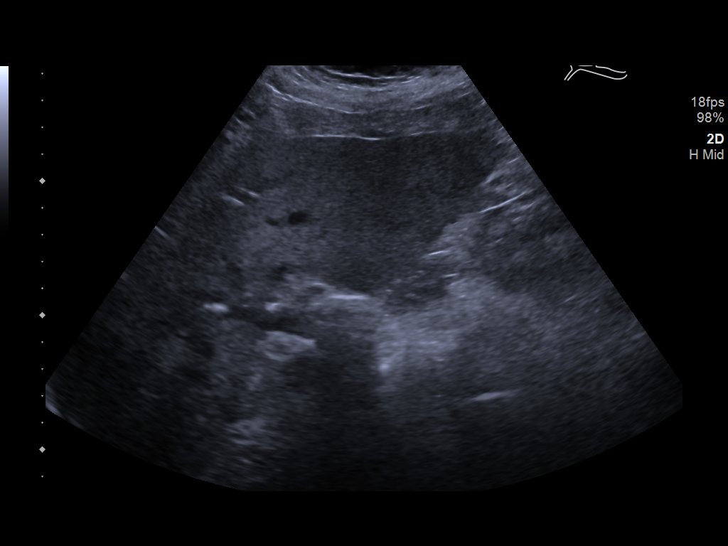
[im 19/41]
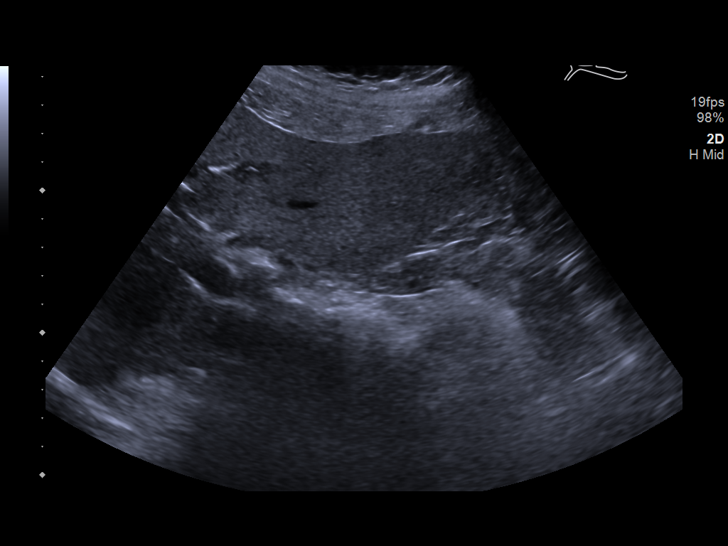
[im 22/41]
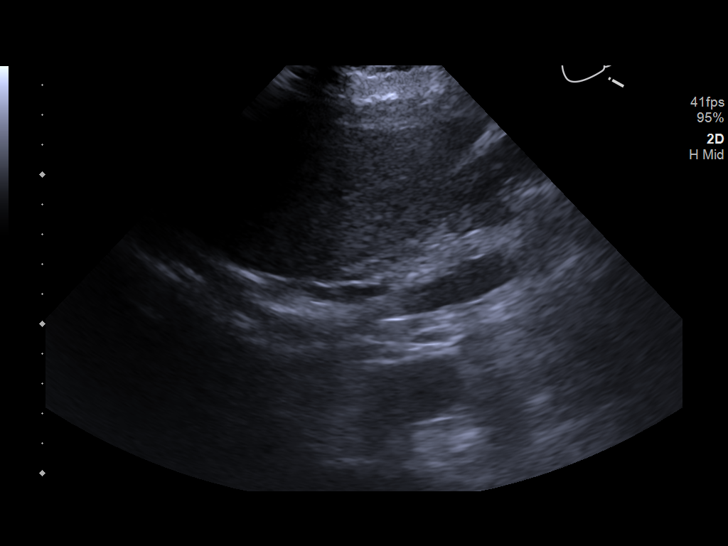
[im 26/41]
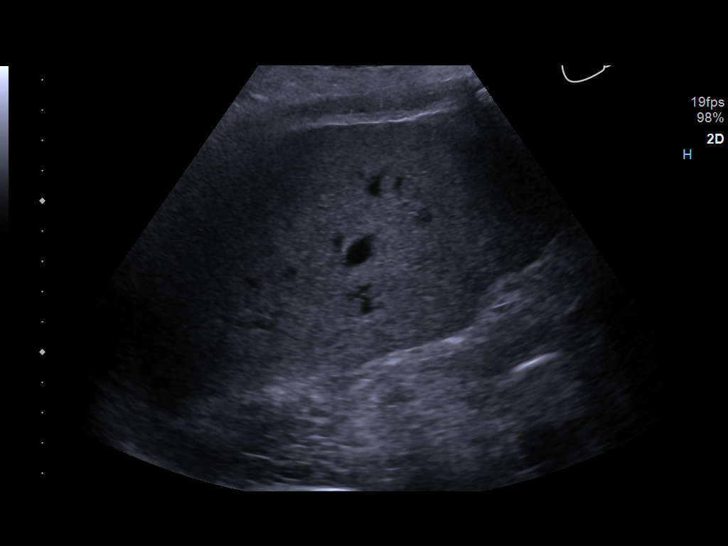
[im 27/41]
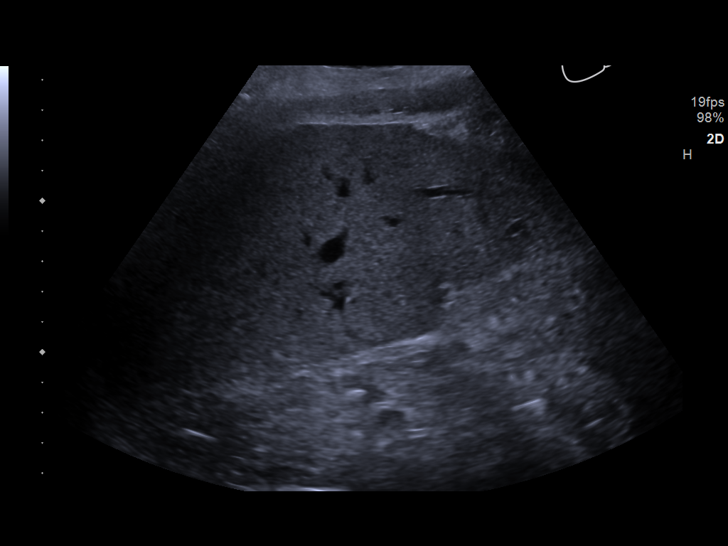
[im 31/41]
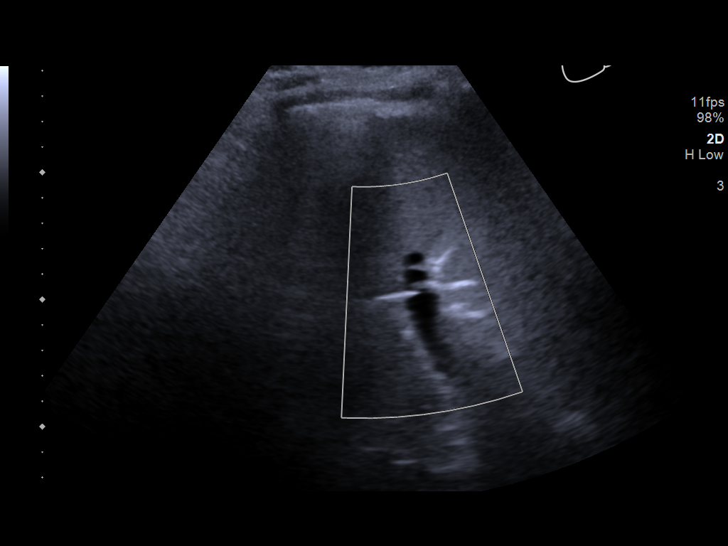
[im 34/41]
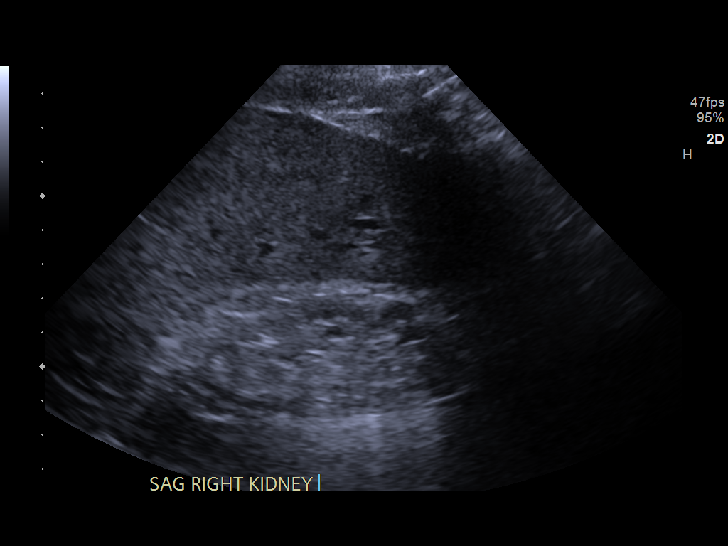
[im 37/41]
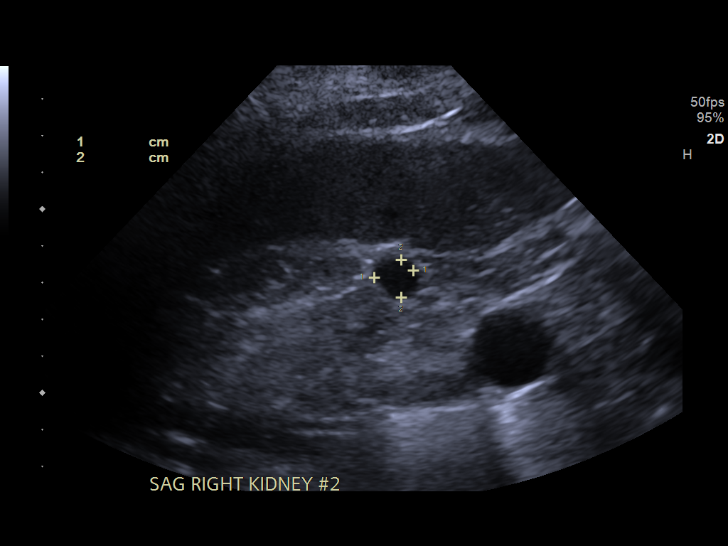
[im 41/41]
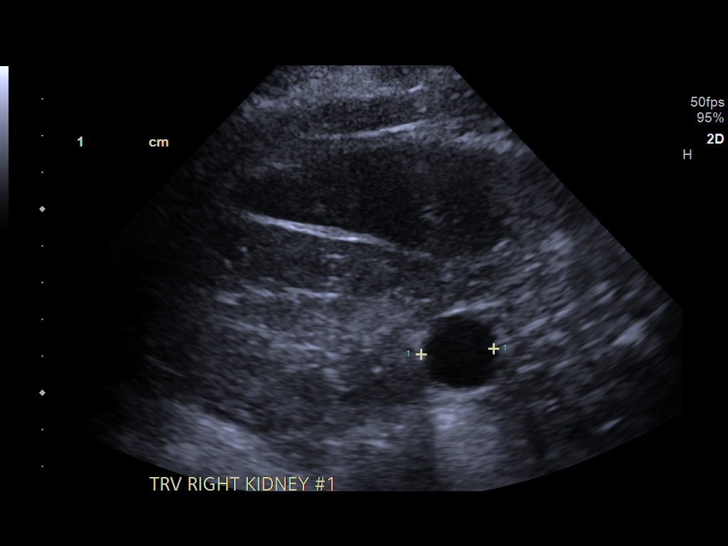

[14 of 25 positions shown; findings below may reference images not displayed]

FINDINGS: Gallbladder:

Removed.

Common bile duct:

Diameter: 0.5 cm

Liver:

Pneumobilia and mild intrahepatic biliary ductal prominence in the
left hepatic lobe as seen on CT earlier today noted. No focal
lesion. Portal vein is patent on color Doppler imaging with normal
direction of blood flow towards the liver.
IMPRESSION: No acute or focal abnormality.

Status post cholecystectomy.

Pneumobilia and mild intrahepatic biliary ductal prominence in the
left hepatic lobe as seen on CT earlier today.

## 2019-07-26 IMAGING — MR MR HEAD W/O CM
10 of 11 series · 43 of 48 positions shown · non-contrast
Comparison: Prior CT from earlier the same day.

CLINICAL DATA: Initial evaluation for acute weakness, confusion,
altered mental status.

EXAM:
MRI HEAD WITHOUT CONTRAST
TECHNIQUE: Multiplanar, multiecho pulse sequences of the brain and surrounding
structures were obtained without intravenous contrast.

[Series 5: DWI · axial · 3.0mm · 0.88mm/px · z∈[-60,+80]mm · 10 of 96 slices shown (1 of 4)]
[im 1/96]
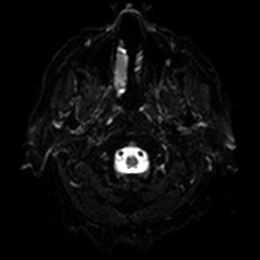
[im 11/96]
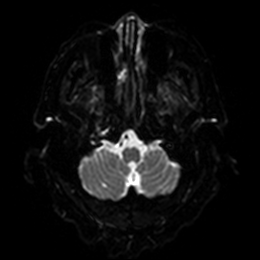
[im 22/96]
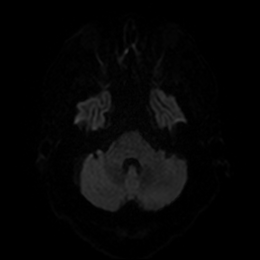
[im 32/96]
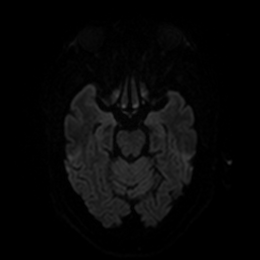
[im 43/96]
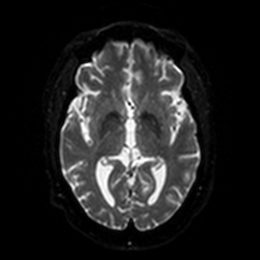
[im 53/96]
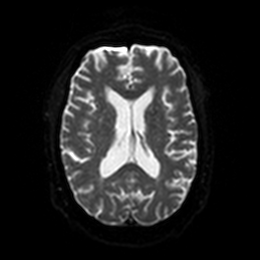
[im 64/96]
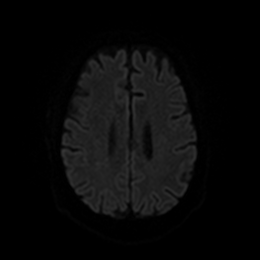
[im 74/96]
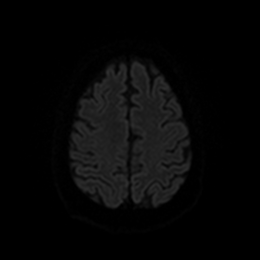
[im 85/96]
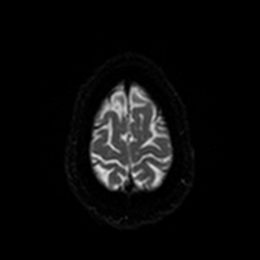
[im 96/96]
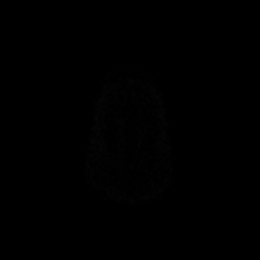

[Series 6: DWI · axial · 3.0mm · 0.88mm/px · z∈[-60,+80]mm · 5 of 48 slices shown (2 of 4)]
[im 1/48]
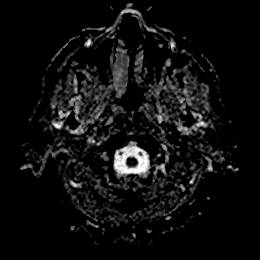
[im 12/48]
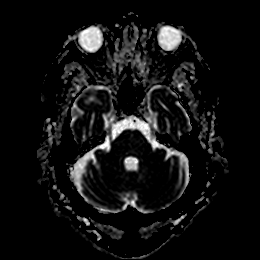
[im 24/48]
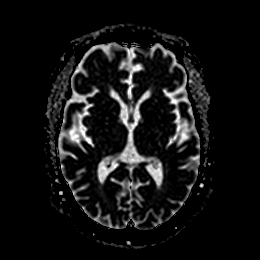
[im 36/48]
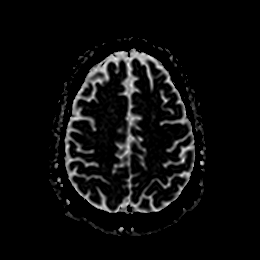
[im 48/48]
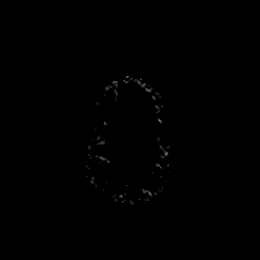

[Series 7: DWI · coronal · 4.0mm · 0.88mm/px · 6 of 68 slices shown (3 of 4)]
[im 1/68]
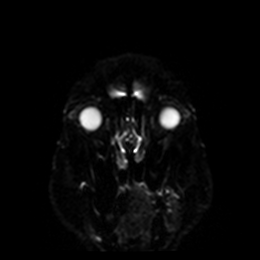
[im 14/68]
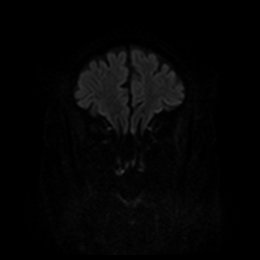
[im 27/68]
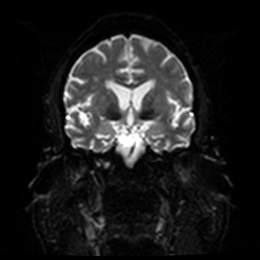
[im 41/68]
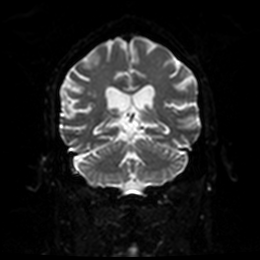
[im 54/68]
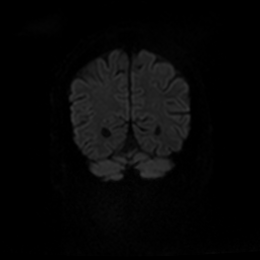
[im 68/68]
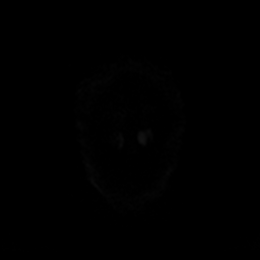

[Series 8: DWI · coronal · 4.0mm · 0.88mm/px · 3 of 34 slices shown (4 of 4)]
[im 1/34]
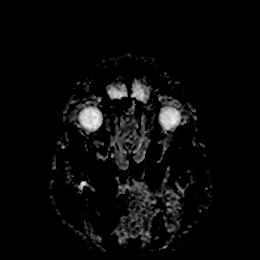
[im 17/34]
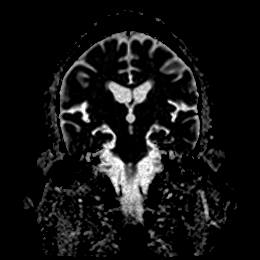
[im 34/34]
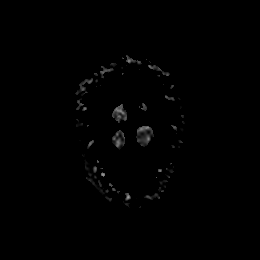

[Series 9: T1 · sagittal · 5.0mm · 0.75mm/px · 2 of 23 slices shown]
[im 1/23]
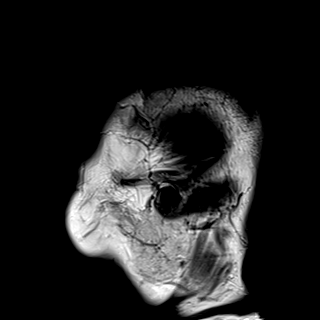
[im 23/23]
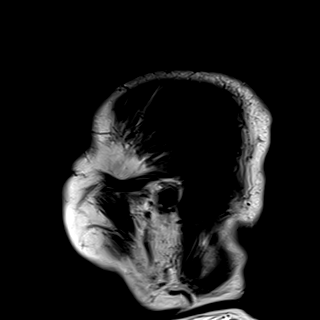

[Series 10: T2 · axial · 5.0mm · 0.72mm/px · z∈[-70,+80]mm · 2 of 26 slices shown (1 of 2)]
[im 1/26]
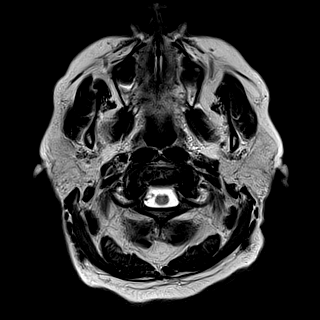
[im 26/26]
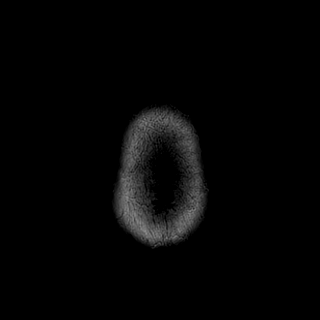

[Series 11: FLAIR · axial · 5.0mm · 0.45mm/px · z∈[-69,+81]mm · 2 of 26 slices shown]
[im 1/26]
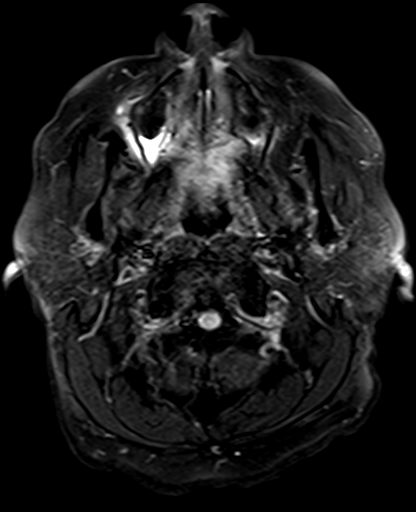
[im 26/26]
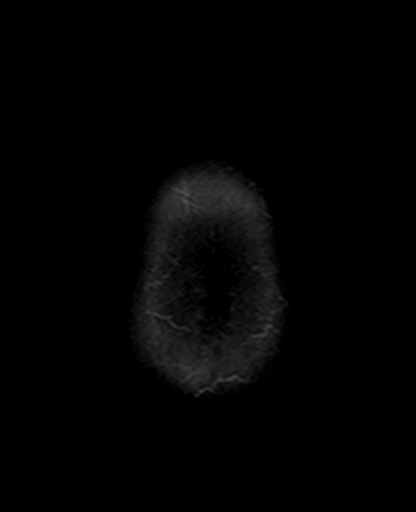

[Series 12: swi_images · axial · 3.0mm · 0.90mm/px · z∈[-83,+94]mm · 5 of 60 slices shown]
[im 1/60]
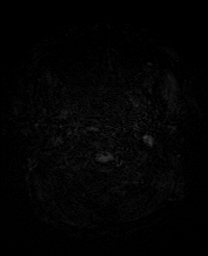
[im 15/60]
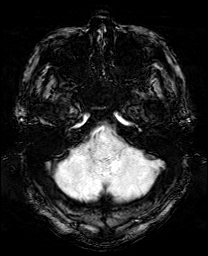
[im 30/60]
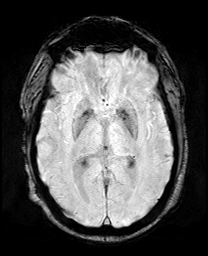
[im 45/60]
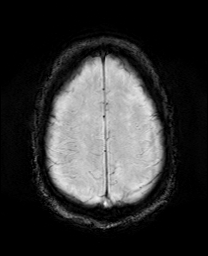
[im 60/60]
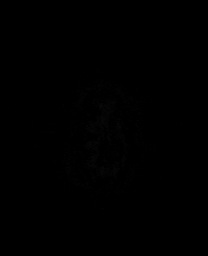

[Series 13: mip_images(sw) · axial · 24.0mm · 0.90mm/px · z∈[-72,+83]mm · 5 of 53 slices shown]
[im 1/53]
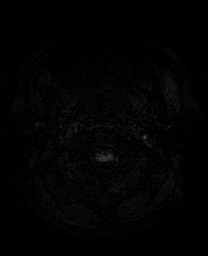
[im 14/53]
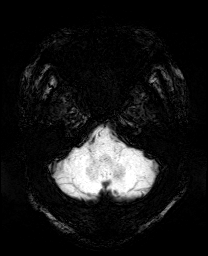
[im 27/53]
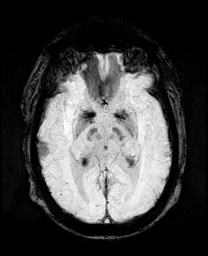
[im 40/53]
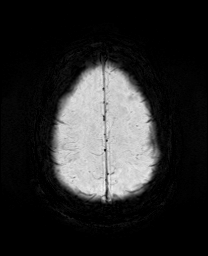
[im 53/53]
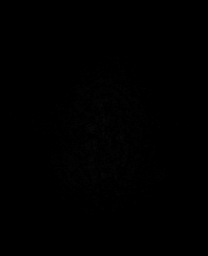

[Series 15: T2 · coronal · 5.0mm · 0.34mm/px · 3 of 30 slices shown (2 of 2)]
[im 1/30]
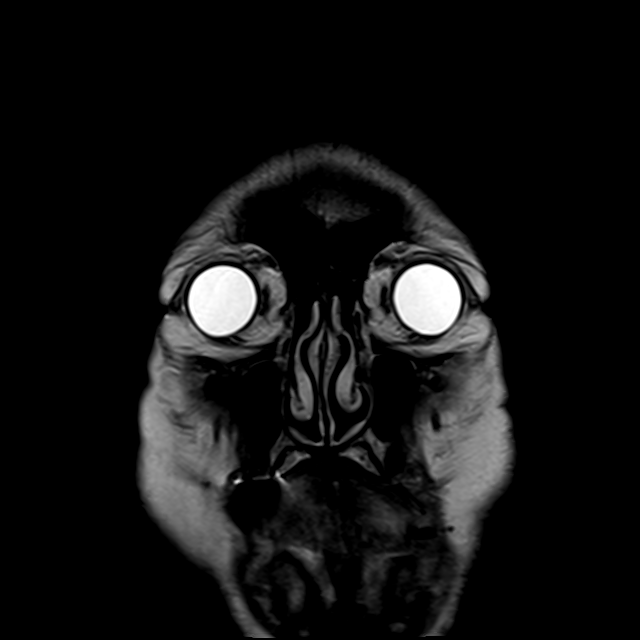
[im 15/30]
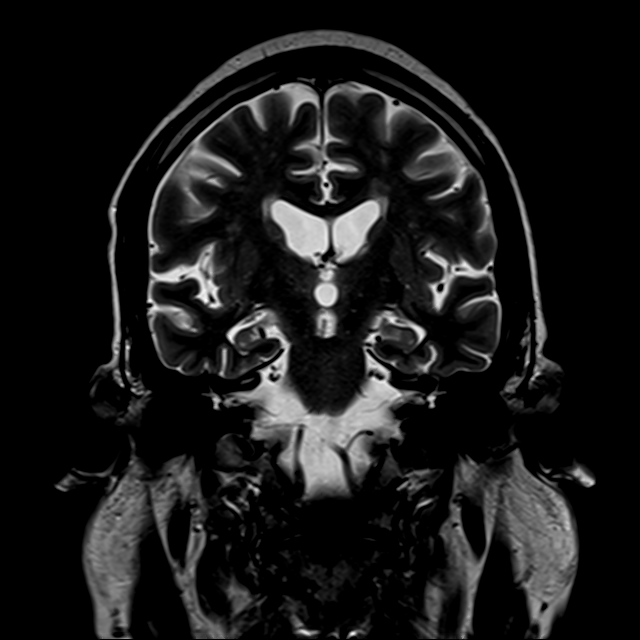
[im 30/30]
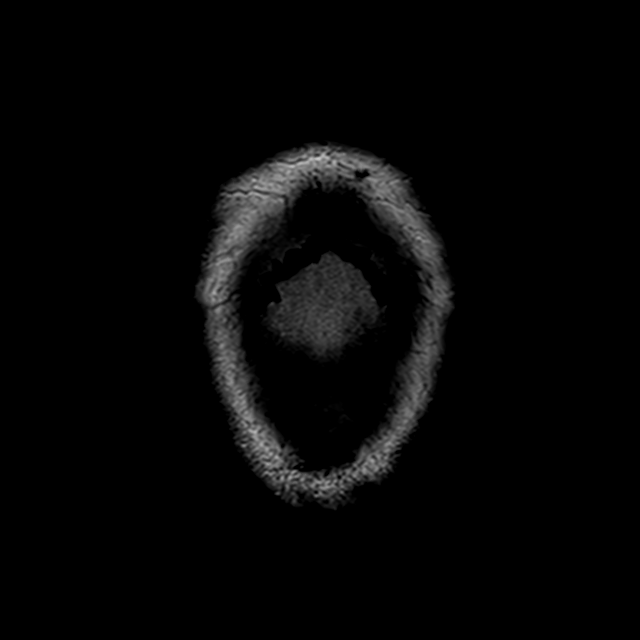

[43 of 48 positions shown; findings below may reference images not displayed]

FINDINGS: Brain: Mild diffuse prominence of the CSF containing spaces
compatible with generalized age-related cerebral atrophy. Patchy and
confluent T2/FLAIR hyperintensity within the periventricular and
deep white matter both cerebral hemispheres most compatible with
chronic microvascular ischemic change, moderate nature. Chronic
microvascular ischemic disease noted within the pons as well.
Subcentimeter remote right cerebellar infarct noted.

6 mm focus of diffusion abnormality within the right cerebellar
hemisphere consistent with a small acute ischemic infarct (series 5,
image 57). No associated hemorrhage or mass effect. No other
evidence for acute or subacute ischemia. Gray-white matter
differentiation otherwise maintained. No evidence for acute or
chronic intracranial hemorrhage.

No mass lesion, midline shift or mass effect. No hydrocephalus. No
extra-axial fluid collection. Pituitary gland within normal limits.

Vascular: Major intracranial vascular flow voids are maintained.

Skull and upper cervical spine: Craniocervical junction within
normal limits. Postsurgical changes noted within the upper cervical
spine, partially visualized. No focal marrow replacing lesion. Scalp
soft tissues unremarkable.

Sinuses/Orbits: Globes and orbital soft tissues within normal
limits. Mild chronic mucosal thickening seen throughout the
paranasal sinuses. No air-fluid levels to suggest acute sinusitis.
Trace opacity bilateral mastoid air cells, of doubtful significance.
Inner ear structures grossly normal.

Other: None.
IMPRESSION: 1. 6 mm acute ischemic nonhemorrhagic right cerebellar infarct.
2. Underlying age-related cerebral atrophy with moderate chronic
small vessel ischemic disease, with additional subcentimeter chronic
right cerebellar infarct.

## 2019-07-26 IMAGING — CT CT ANGIO CHEST
2 of 7 series · 18 of 46 positions shown · IV contrast (APPLIED)
Comparison: Chest radiography 10/03/2018

CLINICAL DATA: Hypoxia. Altered mental status. Dialysis patient.

EXAM:
CT ANGIOGRAPHY CHEST WITH CONTRAST
TECHNIQUE: Multidetector CT imaging of the chest was performed using the
standard protocol during bolus administration of intravenous
contrast. Multiplanar CT image reconstructions and MIPs were
obtained to evaluate the vascular anatomy.
CONTRAST:  100mL [id number] IOPAMIDOL ([id number]) INJECTION 76%

[Series 7: thins · axial · 0.68mm/px · z∈[+1101,+1344]mm · 15 of 391 slices shown]
[im 22/391  lung]
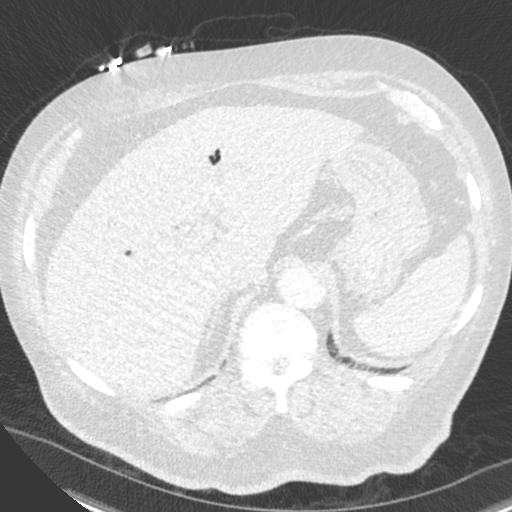
[im 44/391  soft-tissue]
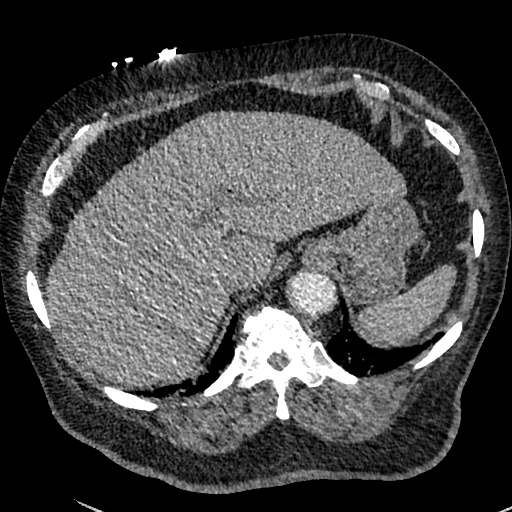
[im 66/391  lung]
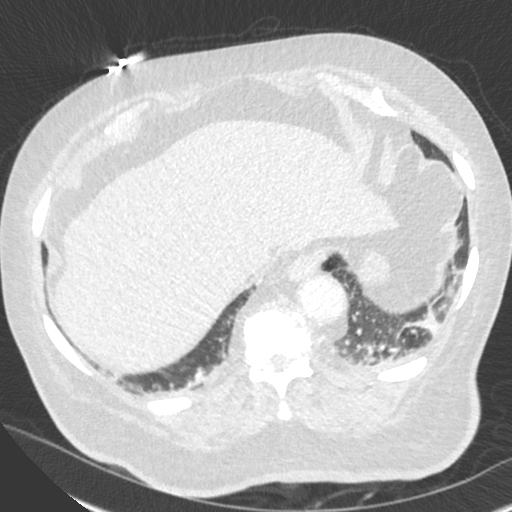
[im 87/391  soft-tissue]
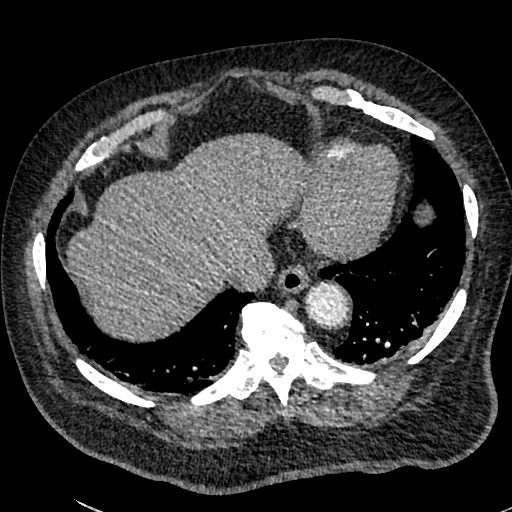
[im 131/391  lung]
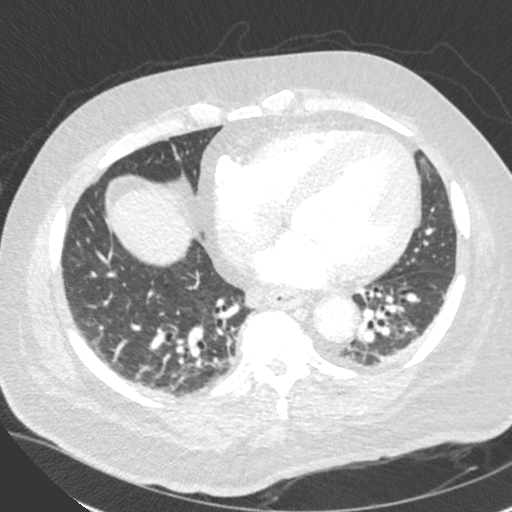
[im 152/391  soft-tissue]
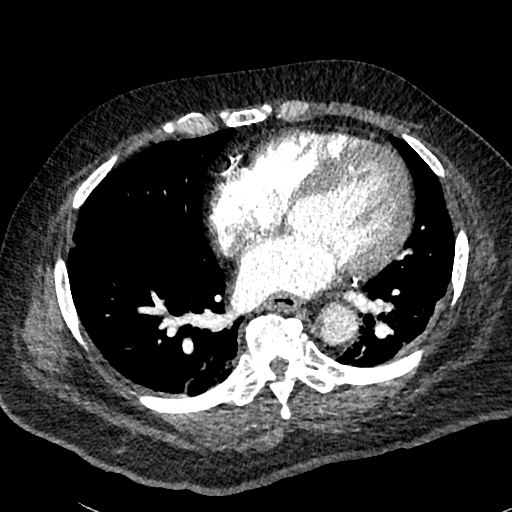
[im 174/391  lung]
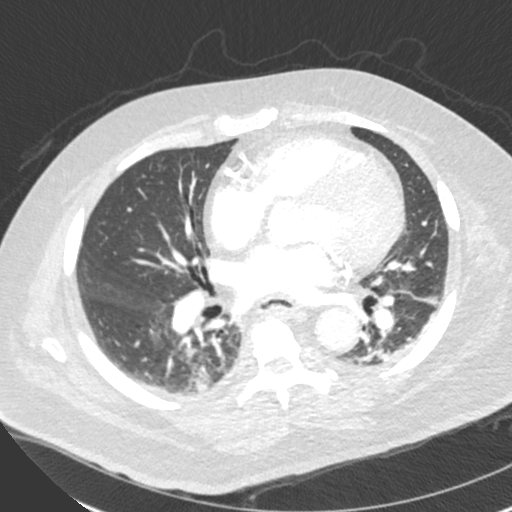
[im 196/391  soft-tissue]
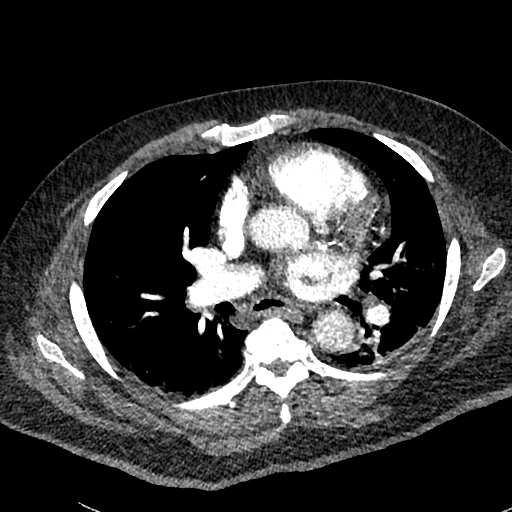
[im 217/391  lung]
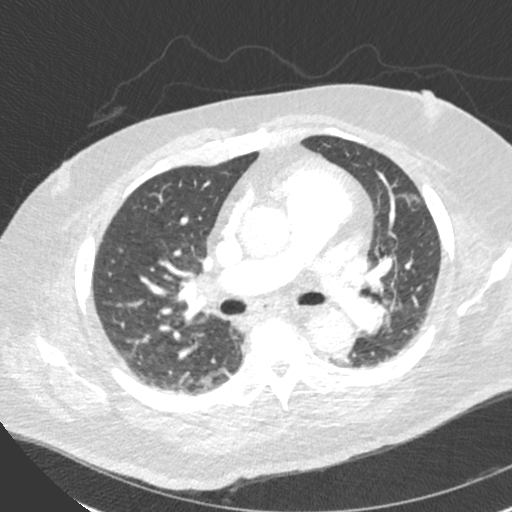
[im 239/391  soft-tissue]
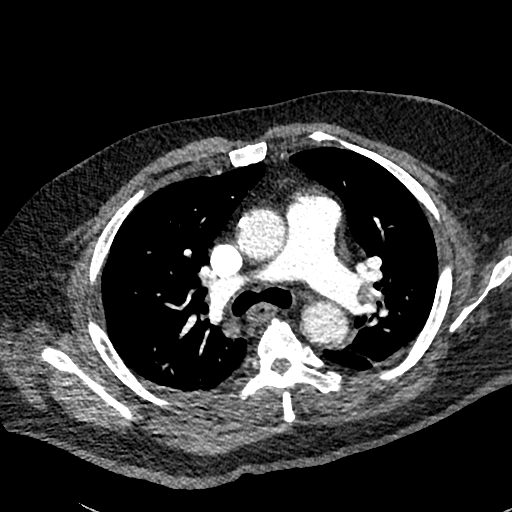
[im 261/391  lung]
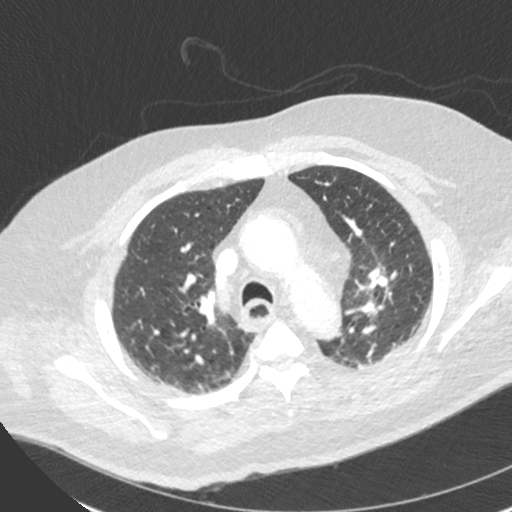
[im 304/391  soft-tissue]
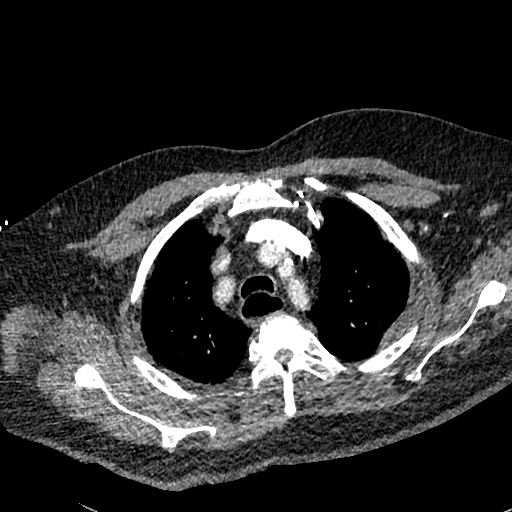
[im 326/391  lung]
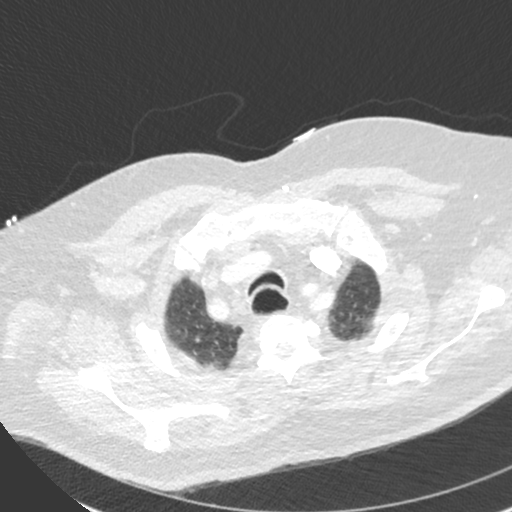
[im 347/391  soft-tissue]
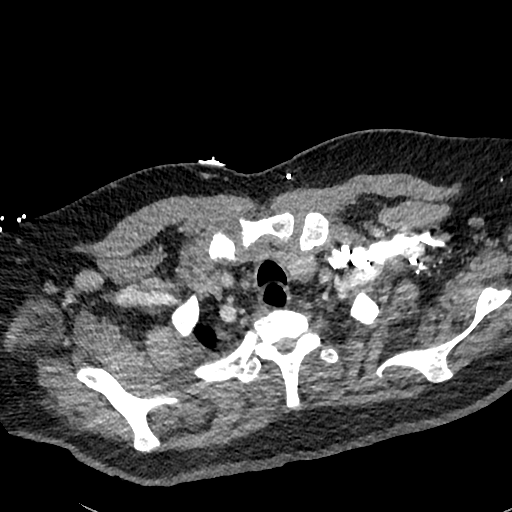
[im 369/391  lung]
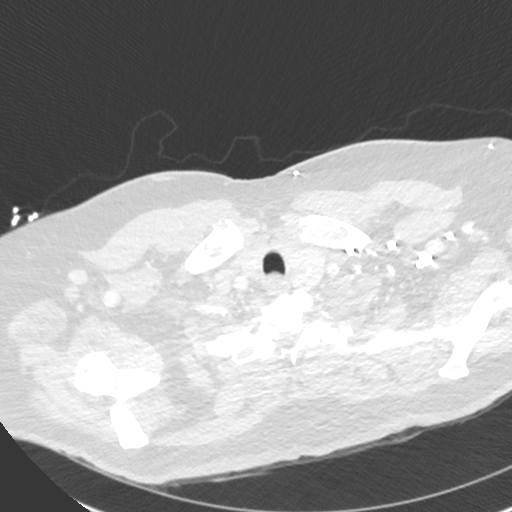

[Series 8: cor · coronal · 0.52mm/px · 3 of 133 slices shown]
[im 34/133  soft-tissue]
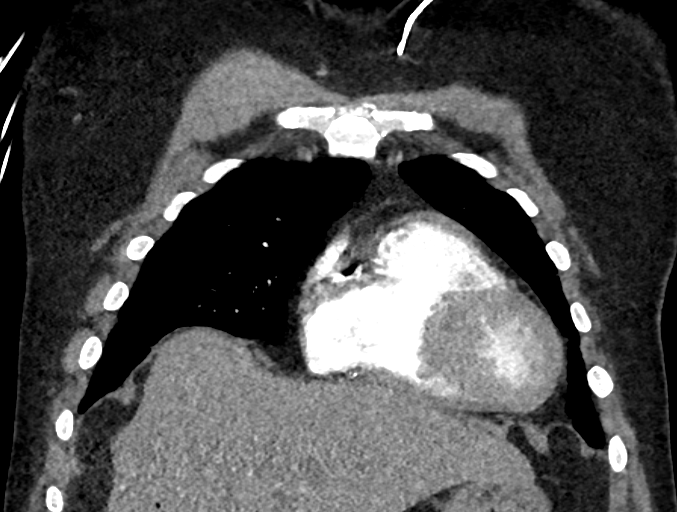
[im 67/133  soft-tissue]
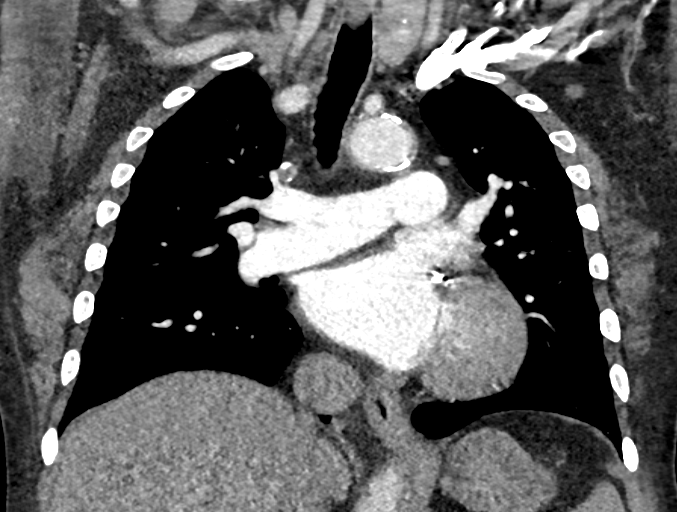
[im 100/133  soft-tissue]
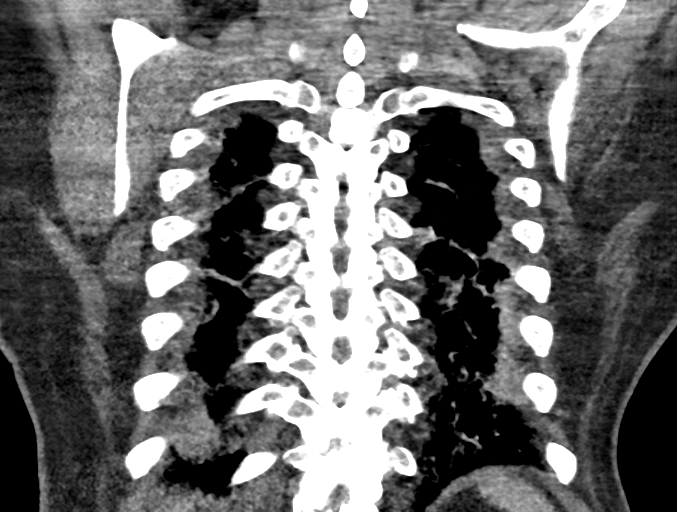

[18 of 46 positions shown; findings below may reference images not displayed]

FINDINGS: Cardiovascular: Pulmonary arterial opacification is good. There are
no pulmonary emboli. The heart is enlarged. There is extensive
coronary artery calcification. There is aortic atherosclerosis
without aneurysm or dissection.

Mediastinum/Nodes: No mass or lymphadenopathy.

Lungs/Pleura: No pleural effusion. Mild dependent pulmonary
atelectasis. No infiltrate or mass.

Upper Abdomen: Air in the biliary tree as seen previously. No acute
upper abdominal finding.

Musculoskeletal: Ordinary thoracic spondylosis, most pronounced at
T11-12.

Review of the MIP images confirms the above findings.
IMPRESSION: 1. No pulmonary emboli or other acute chest pathology.
2. Air in the biliary tree as seen previously.

Aortic Atherosclerosis ([id number]).

## 2019-07-26 IMAGING — CT CT HEAD W/O CM
4 series · 15 of 47 positions shown, 17 images · non-contrast
Comparison: CT 08/19/2018

CLINICAL DATA: Altered mental status.

EXAM:
CT HEAD WITHOUT CONTRAST
TECHNIQUE: Contiguous axial images were obtained from the base of the skull
through the vertex without intravenous contrast.

[Series 3: head wo · axial · 0.46mm/px · z∈[-134,-4]mm · 7 of 36 slices shown, 9 images]
[im 5/36  brain]
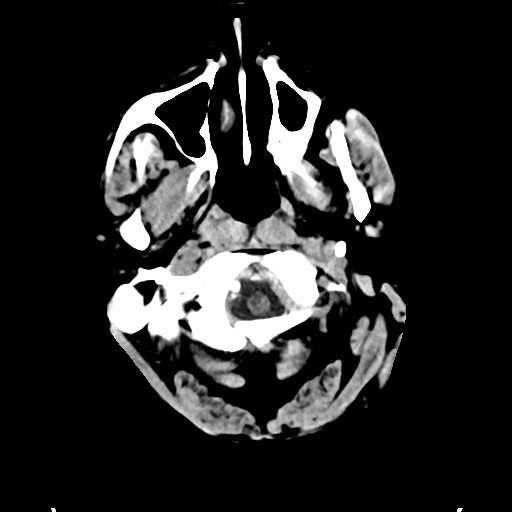
[im 5/36  bone]
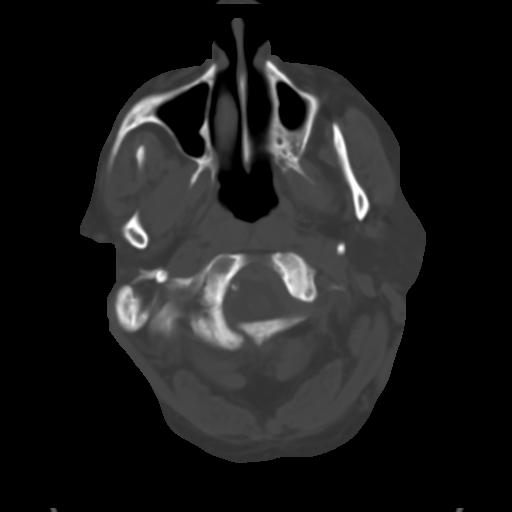
[im 9/36  brain]
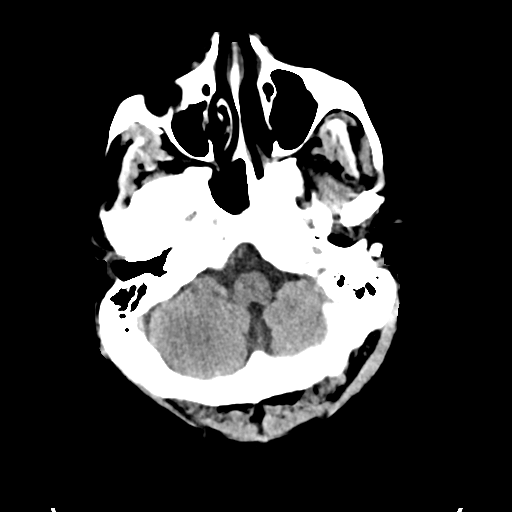
[im 14/36  brain]
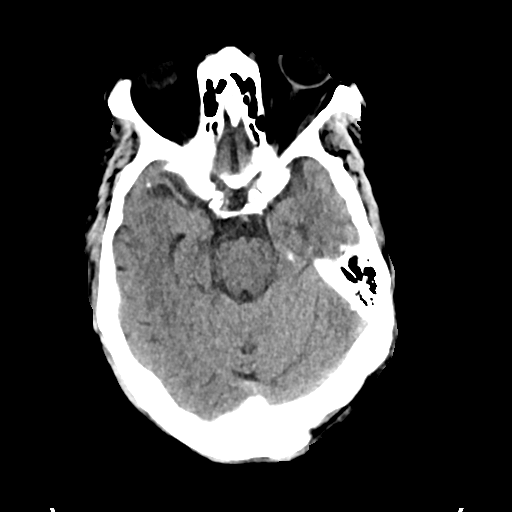
[im 18/36  brain]
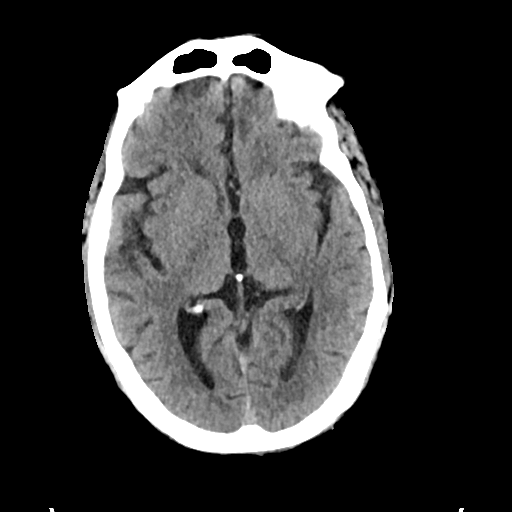
[im 22/36  brain]
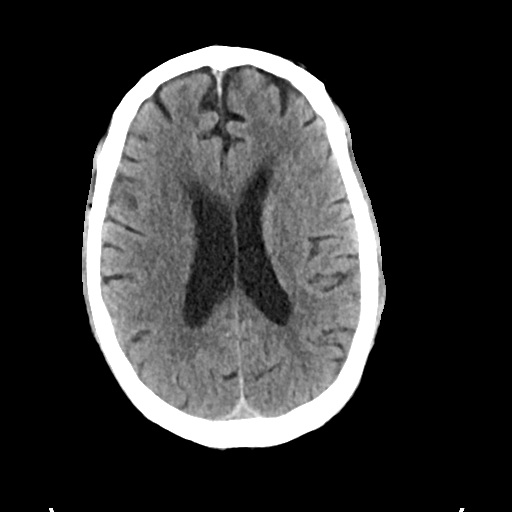
[im 22/36  bone]
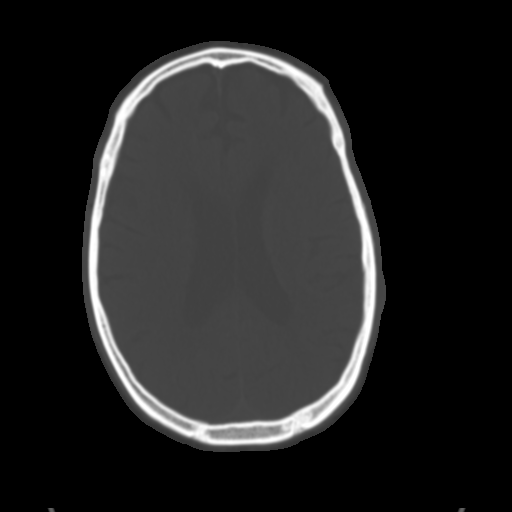
[im 27/36  brain]
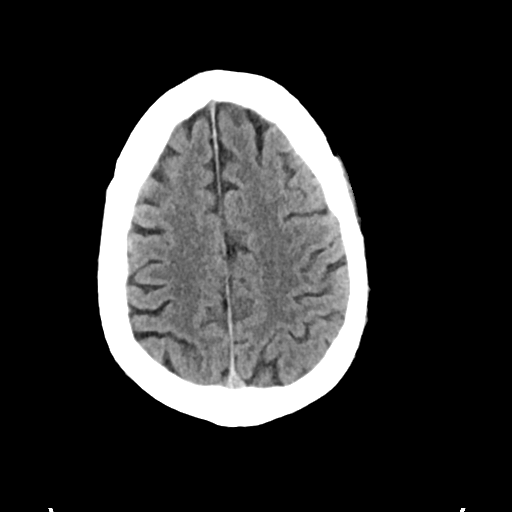
[im 31/36  brain]
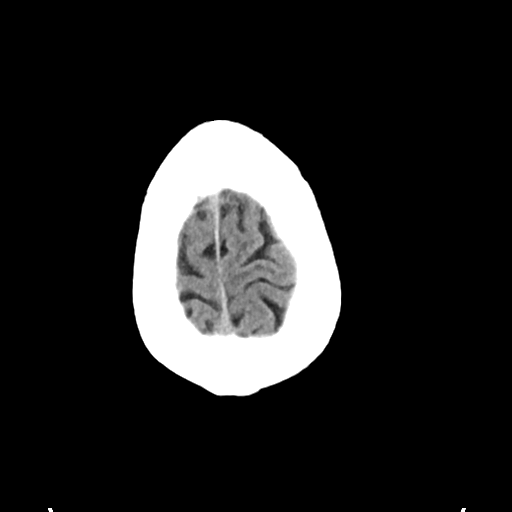

[Series 4: head bone · axial · 0.46mm/px · z∈[-138,-120]mm · 2 of 90 slices shown]
[im 9/90  bone]
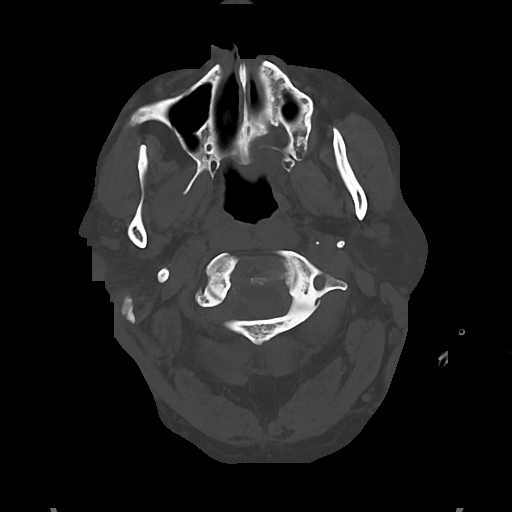
[im 18/90  bone]
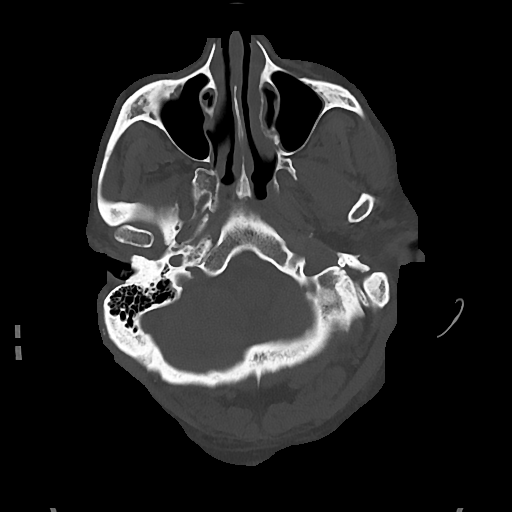

[Series 5: cor soft · coronal · 0.35mm/px · 3 of 77 slices shown]
[im 26/77  brain]
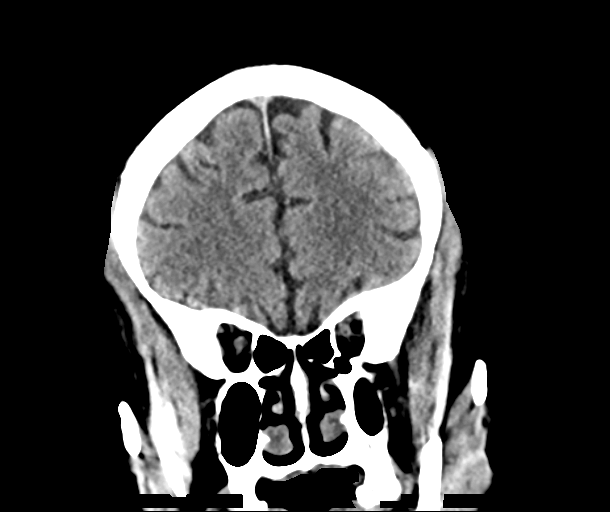
[im 34/77  brain]
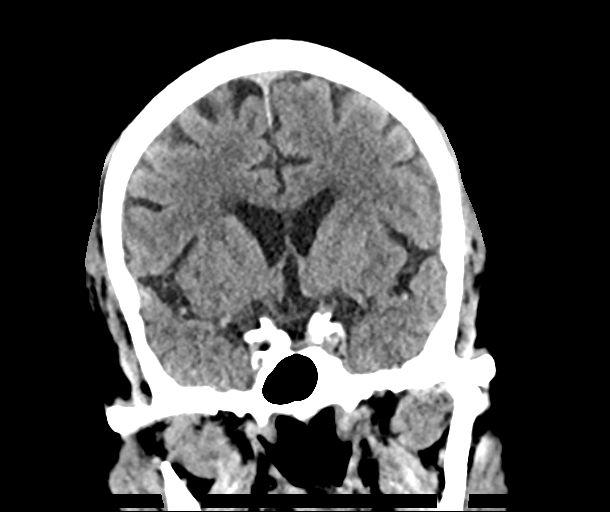
[im 43/77  brain]
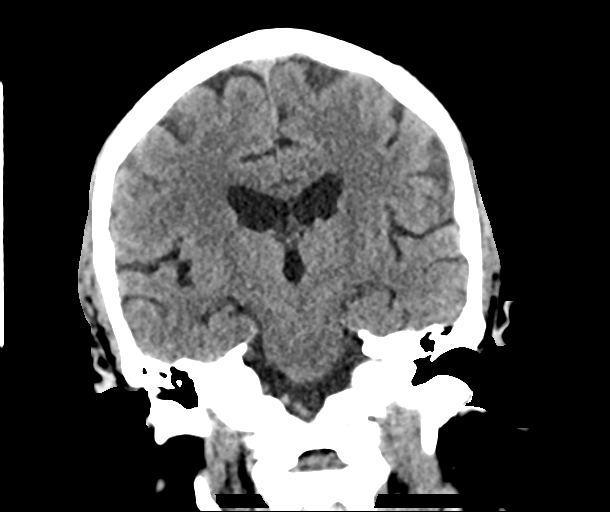

[Series 6: sag soft · sagittal · 0.35mm/px · 3 of 66 slices shown]
[im 22/66  brain]
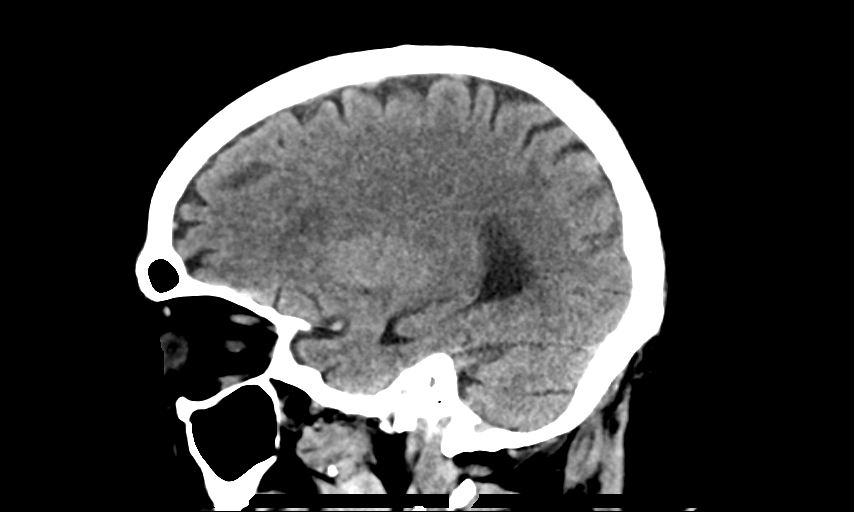
[im 33/66  brain]
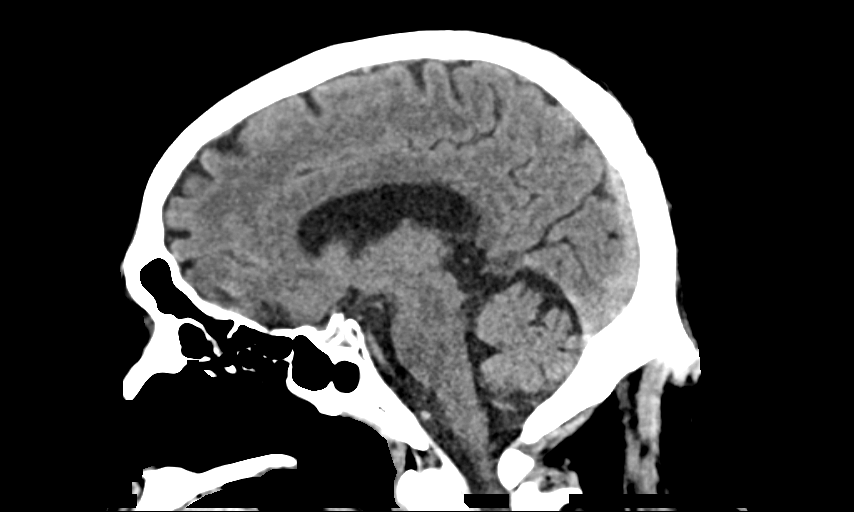
[im 44/66  brain]
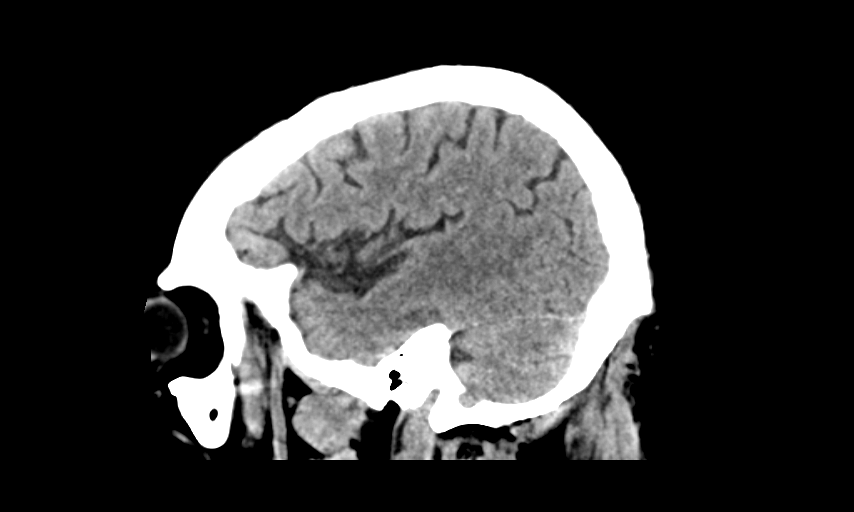

[15 of 47 positions shown; findings below may reference images not displayed]

FINDINGS: Brain: Brain volume is normal for age. Mild chronic small vessel
ischemia, unchanged from prior. No intracranial hemorrhage, mass
effect, or midline shift. No hydrocephalus. The basilar cisterns are
patent. No evidence of territorial infarct or acute ischemia. No
extra-axial or intracranial fluid collection.

Vascular: Atherosclerosis of skullbase vasculature without
hyperdense vessel or abnormal calcification.

Skull: No fracture or focal lesion.

Sinuses/Orbits: No acute finding. Trace mucosal thickening in the
sphenoid sinus, unchanged.

Other: None.
IMPRESSION: No acute intracranial abnormality.

## 2019-07-27 DIAGNOSIS — I5042 Chronic combined systolic (congestive) and diastolic (congestive) heart failure: Secondary | ICD-10-CM | POA: Diagnosis not present

## 2019-07-27 DIAGNOSIS — N186 End stage renal disease: Secondary | ICD-10-CM | POA: Diagnosis not present

## 2019-07-27 DIAGNOSIS — I132 Hypertensive heart and chronic kidney disease with heart failure and with stage 5 chronic kidney disease, or end stage renal disease: Secondary | ICD-10-CM | POA: Diagnosis not present

## 2019-07-27 DIAGNOSIS — A419 Sepsis, unspecified organism: Secondary | ICD-10-CM | POA: Diagnosis not present

## 2019-07-27 DIAGNOSIS — I5082 Biventricular heart failure: Secondary | ICD-10-CM | POA: Diagnosis not present

## 2019-07-27 DIAGNOSIS — E1122 Type 2 diabetes mellitus with diabetic chronic kidney disease: Secondary | ICD-10-CM | POA: Diagnosis not present

## 2019-07-28 DIAGNOSIS — N2581 Secondary hyperparathyroidism of renal origin: Secondary | ICD-10-CM | POA: Diagnosis not present

## 2019-07-28 DIAGNOSIS — N186 End stage renal disease: Secondary | ICD-10-CM | POA: Diagnosis not present

## 2019-07-28 DIAGNOSIS — D631 Anemia in chronic kidney disease: Secondary | ICD-10-CM | POA: Diagnosis not present

## 2019-07-28 DIAGNOSIS — Z992 Dependence on renal dialysis: Secondary | ICD-10-CM | POA: Diagnosis not present

## 2019-07-28 DIAGNOSIS — D509 Iron deficiency anemia, unspecified: Secondary | ICD-10-CM | POA: Diagnosis not present

## 2019-07-28 DIAGNOSIS — E119 Type 2 diabetes mellitus without complications: Secondary | ICD-10-CM | POA: Diagnosis not present

## 2019-07-30 DIAGNOSIS — N186 End stage renal disease: Secondary | ICD-10-CM | POA: Diagnosis not present

## 2019-07-30 DIAGNOSIS — D631 Anemia in chronic kidney disease: Secondary | ICD-10-CM | POA: Diagnosis not present

## 2019-07-30 DIAGNOSIS — D509 Iron deficiency anemia, unspecified: Secondary | ICD-10-CM | POA: Diagnosis not present

## 2019-07-30 DIAGNOSIS — E119 Type 2 diabetes mellitus without complications: Secondary | ICD-10-CM | POA: Diagnosis not present

## 2019-07-30 DIAGNOSIS — N2581 Secondary hyperparathyroidism of renal origin: Secondary | ICD-10-CM | POA: Diagnosis not present

## 2019-07-30 DIAGNOSIS — Z992 Dependence on renal dialysis: Secondary | ICD-10-CM | POA: Diagnosis not present

## 2019-07-31 ENCOUNTER — Emergency Department (HOSPITAL_COMMUNITY): Payer: Medicare Other

## 2019-07-31 ENCOUNTER — Inpatient Hospital Stay (HOSPITAL_COMMUNITY)
Admission: EM | Admit: 2019-07-31 | Discharge: 2019-08-05 | DRG: 871 | Disposition: A | Discharge status: Home Health | Payer: Medicare Other | Attending: Internal Medicine | Admitting: Internal Medicine

## 2019-07-31 ENCOUNTER — Other Ambulatory Visit: Payer: Self-pay

## 2019-07-31 DIAGNOSIS — I252 Old myocardial infarction: Secondary | ICD-10-CM | POA: Diagnosis not present

## 2019-07-31 DIAGNOSIS — E0520 Thyrotoxicosis with toxic multinodular goiter without thyrotoxic crisis or storm: Secondary | ICD-10-CM | POA: Diagnosis present

## 2019-07-31 DIAGNOSIS — I120 Hypertensive chronic kidney disease with stage 5 chronic kidney disease or end stage renal disease: Secondary | ICD-10-CM | POA: Diagnosis not present

## 2019-07-31 DIAGNOSIS — E1142 Type 2 diabetes mellitus with diabetic polyneuropathy: Secondary | ICD-10-CM | POA: Diagnosis present

## 2019-07-31 DIAGNOSIS — E0590 Thyrotoxicosis, unspecified without thyrotoxic crisis or storm: Secondary | ICD-10-CM | POA: Diagnosis not present

## 2019-07-31 DIAGNOSIS — G9341 Metabolic encephalopathy: Secondary | ICD-10-CM | POA: Diagnosis present

## 2019-07-31 DIAGNOSIS — G4733 Obstructive sleep apnea (adult) (pediatric): Secondary | ICD-10-CM | POA: Diagnosis present

## 2019-07-31 DIAGNOSIS — Z992 Dependence on renal dialysis: Secondary | ICD-10-CM

## 2019-07-31 DIAGNOSIS — J449 Chronic obstructive pulmonary disease, unspecified: Secondary | ICD-10-CM | POA: Diagnosis present

## 2019-07-31 DIAGNOSIS — E875 Hyperkalemia: Secondary | ICD-10-CM | POA: Diagnosis present

## 2019-07-31 DIAGNOSIS — E785 Hyperlipidemia, unspecified: Secondary | ICD-10-CM | POA: Diagnosis present

## 2019-07-31 DIAGNOSIS — Z8249 Family history of ischemic heart disease and other diseases of the circulatory system: Secondary | ICD-10-CM

## 2019-07-31 DIAGNOSIS — F0390 Unspecified dementia without behavioral disturbance: Secondary | ICD-10-CM | POA: Diagnosis present

## 2019-07-31 DIAGNOSIS — Z955 Presence of coronary angioplasty implant and graft: Secondary | ICD-10-CM | POA: Diagnosis not present

## 2019-07-31 DIAGNOSIS — R109 Unspecified abdominal pain: Secondary | ICD-10-CM | POA: Diagnosis not present

## 2019-07-31 DIAGNOSIS — K8050 Calculus of bile duct without cholangitis or cholecystitis without obstruction: Secondary | ICD-10-CM

## 2019-07-31 DIAGNOSIS — Z841 Family history of disorders of kidney and ureter: Secondary | ICD-10-CM

## 2019-07-31 DIAGNOSIS — Z20828 Contact with and (suspected) exposure to other viral communicable diseases: Secondary | ICD-10-CM | POA: Diagnosis present

## 2019-07-31 DIAGNOSIS — G9340 Encephalopathy, unspecified: Secondary | ICD-10-CM | POA: Diagnosis not present

## 2019-07-31 DIAGNOSIS — E1129 Type 2 diabetes mellitus with other diabetic kidney complication: Secondary | ICD-10-CM | POA: Diagnosis not present

## 2019-07-31 DIAGNOSIS — R1013 Epigastric pain: Secondary | ICD-10-CM

## 2019-07-31 DIAGNOSIS — I5022 Chronic systolic (congestive) heart failure: Secondary | ICD-10-CM | POA: Diagnosis not present

## 2019-07-31 DIAGNOSIS — D649 Anemia, unspecified: Secondary | ICD-10-CM | POA: Diagnosis present

## 2019-07-31 DIAGNOSIS — A419 Sepsis, unspecified organism: Secondary | ICD-10-CM | POA: Diagnosis present

## 2019-07-31 DIAGNOSIS — Z87891 Personal history of nicotine dependence: Secondary | ICD-10-CM

## 2019-07-31 DIAGNOSIS — Z6832 Body mass index (BMI) 32.0-32.9, adult: Secondary | ICD-10-CM | POA: Diagnosis not present

## 2019-07-31 DIAGNOSIS — I5042 Chronic combined systolic (congestive) and diastolic (congestive) heart failure: Secondary | ICD-10-CM | POA: Diagnosis present

## 2019-07-31 DIAGNOSIS — I10 Essential (primary) hypertension: Secondary | ICD-10-CM | POA: Diagnosis not present

## 2019-07-31 DIAGNOSIS — E1122 Type 2 diabetes mellitus with diabetic chronic kidney disease: Secondary | ICD-10-CM | POA: Diagnosis present

## 2019-07-31 DIAGNOSIS — R509 Fever, unspecified: Secondary | ICD-10-CM | POA: Diagnosis not present

## 2019-07-31 DIAGNOSIS — F329 Major depressive disorder, single episode, unspecified: Secondary | ICD-10-CM | POA: Diagnosis present

## 2019-07-31 DIAGNOSIS — Z8719 Personal history of other diseases of the digestive system: Secondary | ICD-10-CM | POA: Diagnosis not present

## 2019-07-31 DIAGNOSIS — K8030 Calculus of bile duct with cholangitis, unspecified, without obstruction: Secondary | ICD-10-CM | POA: Diagnosis present

## 2019-07-31 DIAGNOSIS — N186 End stage renal disease: Secondary | ICD-10-CM | POA: Diagnosis present

## 2019-07-31 DIAGNOSIS — R000 Tachycardia, unspecified: Secondary | ICD-10-CM | POA: Diagnosis not present

## 2019-07-31 DIAGNOSIS — R079 Chest pain, unspecified: Secondary | ICD-10-CM | POA: Diagnosis not present

## 2019-07-31 DIAGNOSIS — R6520 Severe sepsis without septic shock: Secondary | ICD-10-CM | POA: Diagnosis not present

## 2019-07-31 DIAGNOSIS — I2510 Atherosclerotic heart disease of native coronary artery without angina pectoris: Secondary | ICD-10-CM | POA: Diagnosis present

## 2019-07-31 DIAGNOSIS — E669 Obesity, unspecified: Secondary | ICD-10-CM | POA: Diagnosis present

## 2019-07-31 DIAGNOSIS — N2581 Secondary hyperparathyroidism of renal origin: Secondary | ICD-10-CM | POA: Diagnosis not present

## 2019-07-31 DIAGNOSIS — Z96651 Presence of right artificial knee joint: Secondary | ICD-10-CM | POA: Diagnosis present

## 2019-07-31 DIAGNOSIS — I25119 Atherosclerotic heart disease of native coronary artery with unspecified angina pectoris: Secondary | ICD-10-CM | POA: Diagnosis not present

## 2019-07-31 DIAGNOSIS — G7281 Critical illness myopathy: Secondary | ICD-10-CM | POA: Diagnosis not present

## 2019-07-31 DIAGNOSIS — I4892 Unspecified atrial flutter: Secondary | ICD-10-CM | POA: Diagnosis not present

## 2019-07-31 DIAGNOSIS — R404 Transient alteration of awareness: Secondary | ICD-10-CM | POA: Diagnosis not present

## 2019-07-31 DIAGNOSIS — I132 Hypertensive heart and chronic kidney disease with heart failure and with stage 5 chronic kidney disease, or end stage renal disease: Secondary | ICD-10-CM | POA: Diagnosis present

## 2019-07-31 DIAGNOSIS — Z8546 Personal history of malignant neoplasm of prostate: Secondary | ICD-10-CM

## 2019-07-31 DIAGNOSIS — M109 Gout, unspecified: Secondary | ICD-10-CM | POA: Diagnosis present

## 2019-07-31 DIAGNOSIS — R0902 Hypoxemia: Secondary | ICD-10-CM | POA: Diagnosis not present

## 2019-07-31 DIAGNOSIS — A021 Salmonella sepsis: Secondary | ICD-10-CM | POA: Diagnosis not present

## 2019-07-31 DIAGNOSIS — D631 Anemia in chronic kidney disease: Secondary | ICD-10-CM | POA: Diagnosis not present

## 2019-07-31 DIAGNOSIS — I959 Hypotension, unspecified: Secondary | ICD-10-CM | POA: Diagnosis not present

## 2019-07-31 DIAGNOSIS — Z833 Family history of diabetes mellitus: Secondary | ICD-10-CM

## 2019-07-31 DIAGNOSIS — I491 Atrial premature depolarization: Secondary | ICD-10-CM | POA: Diagnosis not present

## 2019-07-31 DIAGNOSIS — R945 Abnormal results of liver function studies: Secondary | ICD-10-CM | POA: Diagnosis not present

## 2019-07-31 DIAGNOSIS — Z923 Personal history of irradiation: Secondary | ICD-10-CM

## 2019-07-31 DIAGNOSIS — Z4659 Encounter for fitting and adjustment of other gastrointestinal appliance and device: Secondary | ICD-10-CM | POA: Diagnosis not present

## 2019-07-31 LAB — CBC WITH DIFFERENTIAL/PLATELET
Abs Immature Granulocytes: 0.04 10*3/uL (ref 0.00–0.07)
Basophils Absolute: 0 10*3/uL (ref 0.0–0.1)
Basophils Relative: 0 %
Eosinophils Absolute: 0 10*3/uL (ref 0.0–0.5)
Eosinophils Relative: 0 %
HCT: 44.2 % (ref 39.0–52.0)
Hemoglobin: 13.5 g/dL (ref 13.0–17.0)
Immature Granulocytes: 0 %
Lymphocytes Relative: 6 %
Lymphs Abs: 0.6 10*3/uL — ABNORMAL LOW (ref 0.7–4.0)
MCH: 30.2 pg (ref 26.0–34.0)
MCHC: 30.5 g/dL (ref 30.0–36.0)
MCV: 98.9 fL (ref 80.0–100.0)
Monocytes Absolute: 1.2 10*3/uL — ABNORMAL HIGH (ref 0.1–1.0)
Monocytes Relative: 12 %
Neutro Abs: 8.1 10*3/uL — ABNORMAL HIGH (ref 1.7–7.7)
Neutrophils Relative %: 82 %
Platelets: 209 10*3/uL (ref 150–400)
RBC: 4.47 MIL/uL (ref 4.22–5.81)
RDW: 16.6 % — ABNORMAL HIGH (ref 11.5–15.5)
WBC: 10 10*3/uL (ref 4.0–10.5)
nRBC: 0 % (ref 0.0–0.2)

## 2019-07-31 LAB — I-STAT CHEM 8, ED
BUN: 53 mg/dL — ABNORMAL HIGH (ref 8–23)
Calcium, Ion: 0.8 mmol/L — CL (ref 1.15–1.40)
Chloride: 107 mmol/L (ref 98–111)
Creatinine, Ser: 7.1 mg/dL — ABNORMAL HIGH (ref 0.61–1.24)
Glucose, Bld: 123 mg/dL — ABNORMAL HIGH (ref 70–99)
HCT: 30 % — ABNORMAL LOW (ref 39.0–52.0)
Hemoglobin: 10.2 g/dL — ABNORMAL LOW (ref 13.0–17.0)
Potassium: 6.8 mmol/L (ref 3.5–5.1)
Sodium: 136 mmol/L (ref 135–145)
TCO2: 24 mmol/L (ref 22–32)

## 2019-07-31 LAB — TSH: TSH: 0.564 u[IU]/mL (ref 0.350–4.500)

## 2019-07-31 LAB — COMPREHENSIVE METABOLIC PANEL
ALT: 63 U/L — ABNORMAL HIGH (ref 0–44)
AST: 40 U/L (ref 15–41)
Albumin: 3.2 g/dL — ABNORMAL LOW (ref 3.5–5.0)
Alkaline Phosphatase: 147 U/L — ABNORMAL HIGH (ref 38–126)
Anion gap: 18 — ABNORMAL HIGH (ref 5–15)
BUN: 41 mg/dL — ABNORMAL HIGH (ref 8–23)
CO2: 25 mmol/L (ref 22–32)
Calcium: 8.9 mg/dL (ref 8.9–10.3)
Chloride: 95 mmol/L — ABNORMAL LOW (ref 98–111)
Creatinine, Ser: 8.24 mg/dL — ABNORMAL HIGH (ref 0.61–1.24)
GFR calc Af Amer: 7 mL/min — ABNORMAL LOW (ref >60)
GFR calc non Af Amer: 6 mL/min — ABNORMAL LOW (ref >60)
Glucose, Bld: 148 mg/dL — ABNORMAL HIGH (ref 70–99)
Potassium: 5.7 mmol/L — ABNORMAL HIGH (ref 3.5–5.1)
Sodium: 138 mmol/L (ref 135–145)
Total Bilirubin: 1.1 mg/dL (ref 0.3–1.2)
Total Protein: 7.3 g/dL (ref 6.5–8.1)

## 2019-07-31 LAB — BASIC METABOLIC PANEL
Anion gap: 16 — ABNORMAL HIGH (ref 5–15)
BUN: 46 mg/dL — ABNORMAL HIGH (ref 8–23)
CO2: 25 mmol/L (ref 22–32)
Calcium: 8.6 mg/dL — ABNORMAL LOW (ref 8.9–10.3)
Chloride: 98 mmol/L (ref 98–111)
Creatinine, Ser: 8.48 mg/dL — ABNORMAL HIGH (ref 0.61–1.24)
GFR calc Af Amer: 7 mL/min — ABNORMAL LOW (ref >60)
GFR calc non Af Amer: 6 mL/min — ABNORMAL LOW (ref >60)
Glucose, Bld: 127 mg/dL — ABNORMAL HIGH (ref 70–99)
Potassium: 5.7 mmol/L — ABNORMAL HIGH (ref 3.5–5.1)
Sodium: 139 mmol/L (ref 135–145)

## 2019-07-31 LAB — LACTIC ACID, PLASMA
Lactic Acid, Venous: 2 mmol/L (ref 0.5–1.9)
Lactic Acid, Venous: 1.2 mmol/L (ref 0.5–1.9)

## 2019-07-31 LAB — T4, FREE: Free T4: 0.94 ng/dL (ref 0.61–1.12)

## 2019-07-31 LAB — TROPONIN I (HIGH SENSITIVITY)
Troponin I (High Sensitivity): 32 ng/L — ABNORMAL HIGH (ref <18)
Troponin I (High Sensitivity): 37 ng/L — ABNORMAL HIGH (ref <18)

## 2019-07-31 LAB — PROTIME-INR
INR: 1.1 (ref 0.8–1.2)
Prothrombin Time: 13.6 seconds (ref 11.4–15.2)

## 2019-07-31 LAB — GLUCOSE, CAPILLARY: Glucose-Capillary: 134 mg/dL — ABNORMAL HIGH (ref 70–99)

## 2019-07-31 LAB — HIV ANTIBODY (ROUTINE TESTING W REFLEX): HIV Screen 4th Generation wRfx: NONREACTIVE

## 2019-07-31 LAB — SARS CORONAVIRUS 2 BY RT PCR (HOSPITAL ORDER, PERFORMED IN ~~LOC~~ HOSPITAL LAB): SARS Coronavirus 2: NEGATIVE

## 2019-07-31 LAB — CBG MONITORING, ED: Glucose-Capillary: 130 mg/dL — ABNORMAL HIGH (ref 70–99)

## 2019-07-31 LAB — ETHANOL: Alcohol, Ethyl (B): <10 mg/dL (ref <10)

## 2019-07-31 LAB — APTT: aPTT: 32 seconds (ref 24–36)

## 2019-07-31 LAB — LIPASE, BLOOD: Lipase: 33 U/L (ref 11–51)

## 2019-07-31 MED ORDER — VANCOMYCIN HCL 10 G IV SOLR
2500.0000 mg | Freq: Once | INTRAVENOUS | Status: AC
  Administered 2019-07-31: 2500 mg via INTRAVENOUS
  Filled 2019-07-31: qty 2500

## 2019-07-31 MED ORDER — POLYETHYLENE GLYCOL 3350 17 G PO PACK: 17.0000 g | PACK | Freq: Every day | ORAL | Status: DC | PRN

## 2019-07-31 MED ORDER — LOPERAMIDE HCL 2 MG PO CAPS
2.0000 mg | ORAL_CAPSULE | Freq: Four times a day (QID) | ORAL | Status: DC | PRN
  Administered 2019-08-04: 2 mg via ORAL
  Filled 2019-07-31: qty 1

## 2019-07-31 MED ORDER — HEPARIN SODIUM (PORCINE) 5000 UNIT/ML IJ SOLN
5000.0000 [IU] | Freq: Three times a day (TID) | INTRAMUSCULAR | Status: DC
  Administered 2019-07-31 – 2019-08-05 (×12): 5000 [IU] via SUBCUTANEOUS
  Filled 2019-07-31 (×13): qty 1

## 2019-07-31 MED ORDER — SODIUM CHLORIDE 0.9 % IV SOLN
1.0000 g | Freq: Once | INTRAVENOUS | Status: AC
  Administered 2019-07-31: 1 g via INTRAVENOUS
  Filled 2019-07-31: qty 1

## 2019-07-31 MED ORDER — ACETAMINOPHEN 325 MG PO TABS: 650.0000 mg | ORAL_TABLET | Freq: Four times a day (QID) | ORAL | Status: DC | PRN

## 2019-07-31 MED ORDER — ASPIRIN EC 325 MG PO TBEC
325.0000 mg | DELAYED_RELEASE_TABLET | Freq: Every day | ORAL | Status: DC
  Administered 2019-07-31 – 2019-08-05 (×6): 325 mg via ORAL
  Filled 2019-07-31 (×6): qty 1

## 2019-07-31 MED ORDER — METRONIDAZOLE IN NACL 5-0.79 MG/ML-% IV SOLN
500.0000 mg | Freq: Three times a day (TID) | INTRAVENOUS | Status: DC
  Administered 2019-07-31 – 2019-08-05 (×14): 500 mg via INTRAVENOUS
  Filled 2019-07-31 (×14): qty 100

## 2019-07-31 MED ORDER — VANCOMYCIN HCL 10 G IV SOLR: 2500.0000 mg | INTRAVENOUS | Status: DC

## 2019-07-31 MED ORDER — METHIMAZOLE 5 MG PO TABS
2.5000 mg | ORAL_TABLET | Freq: Every day | ORAL | Status: DC
  Administered 2019-08-01 – 2019-08-05 (×5): 2.5 mg via ORAL
  Filled 2019-07-31 (×5): qty 1

## 2019-07-31 MED ORDER — SODIUM CHLORIDE 0.9 % IV SOLN
1.0000 g | INTRAVENOUS | Status: DC
  Administered 2019-07-31 – 2019-08-05 (×6): 1 g via INTRAVENOUS
  Filled 2019-07-31 (×6): qty 1

## 2019-07-31 MED ORDER — FERRIC CITRATE 1 GM 210 MG(FE) PO TABS
630.0000 mg | ORAL_TABLET | Freq: Three times a day (TID) | ORAL | Status: DC
  Administered 2019-08-01 – 2019-08-05 (×12): 630 mg via ORAL
  Filled 2019-07-31 (×13): qty 3

## 2019-07-31 MED ORDER — METRONIDAZOLE IN NACL 5-0.79 MG/ML-% IV SOLN: 500.0000 mg | Freq: Once | INTRAVENOUS | Status: DC

## 2019-07-31 MED ORDER — RENA-VITE PO TABS
1.0000 | ORAL_TABLET | Freq: Every day | ORAL | Status: DC
  Administered 2019-07-31 – 2019-08-05 (×6): 1 via ORAL
  Filled 2019-07-31 (×6): qty 1

## 2019-07-31 MED ORDER — HYDROCODONE-ACETAMINOPHEN 5-325 MG PO TABS
1.0000 | ORAL_TABLET | ORAL | Status: DC | PRN
  Administered 2019-08-01: 06:00:00 1 via ORAL
  Filled 2019-07-31: qty 1

## 2019-07-31 MED ORDER — ONDANSETRON HCL 4 MG/2ML IJ SOLN: 4.0000 mg | Freq: Four times a day (QID) | INTRAMUSCULAR | Status: DC | PRN

## 2019-07-31 MED ORDER — SODIUM CHLORIDE 0.9 % IV SOLN
1.0000 g | Freq: Once | INTRAVENOUS | Status: AC
  Administered 2019-07-31: 1 g via INTRAVENOUS
  Filled 2019-07-31: qty 10

## 2019-07-31 MED ORDER — ACETAMINOPHEN 650 MG RE SUPP: 650.0000 mg | Freq: Four times a day (QID) | RECTAL | Status: DC | PRN

## 2019-07-31 MED ORDER — INSULIN ASPART 100 UNIT/ML ~~LOC~~ SOLN
0.0000 [IU] | SUBCUTANEOUS | Status: DC
  Administered 2019-07-31: 1 [IU] via SUBCUTANEOUS
  Administered 2019-08-04: 21:00:00 2 [IU] via SUBCUTANEOUS

## 2019-07-31 MED ORDER — PANTOPRAZOLE SODIUM 40 MG PO TBEC
40.0000 mg | DELAYED_RELEASE_TABLET | Freq: Every day | ORAL | Status: DC
  Administered 2019-07-31 – 2019-08-04 (×4): 40 mg via ORAL
  Filled 2019-07-31 (×5): qty 1

## 2019-07-31 MED ORDER — EZETIMIBE 10 MG PO TABS
10.0000 mg | ORAL_TABLET | Freq: Every evening | ORAL | Status: DC
  Administered 2019-07-31 – 2019-08-04 (×5): 10 mg via ORAL
  Filled 2019-07-31 (×5): qty 1

## 2019-07-31 MED ORDER — SODIUM CHLORIDE 0.9 % IV SOLN: 1.0000 g | Freq: Three times a day (TID) | INTRAVENOUS | Status: DC

## 2019-07-31 MED ORDER — HYPROMELLOSE (GONIOSCOPIC) 2.5 % OP SOLN
1.0000 [drp] | Freq: Three times a day (TID) | OPHTHALMIC | Status: DC | PRN
  Filled 2019-07-31 (×2): qty 15

## 2019-07-31 MED ORDER — SODIUM CHLORIDE 0.9 % IV BOLUS
500.0000 mL | Freq: Once | INTRAVENOUS | Status: AC
  Administered 2019-07-31: 500 mL via INTRAVENOUS

## 2019-07-31 MED ORDER — ONDANSETRON HCL 4 MG PO TABS: 4.0000 mg | ORAL_TABLET | Freq: Four times a day (QID) | ORAL | Status: DC | PRN

## 2019-07-31 MED ORDER — VANCOMYCIN HCL IN DEXTROSE 1-5 GM/200ML-% IV SOLN: 1000.0000 mg | Freq: Once | INTRAVENOUS | Status: DC

## 2019-07-31 MED ORDER — SODIUM CHLORIDE 0.9 % IV SOLN: 2.0000 g | Freq: Once | INTRAVENOUS | Status: DC

## 2019-07-31 MED ORDER — VANCOMYCIN VARIABLE DOSE PER UNSTABLE RENAL FUNCTION (PHARMACIST DOSING): Status: DC

## 2019-07-31 MED ORDER — ALBUTEROL SULFATE (2.5 MG/3ML) 0.083% IN NEBU: 2.5000 mg | INHALATION_SOLUTION | RESPIRATORY_TRACT | Status: DC | PRN

## 2019-07-31 MED ORDER — SODIUM BICARBONATE 8.4 % IV SOLN
50.0000 meq | Freq: Once | INTRAVENOUS | Status: AC
  Administered 2019-07-31: 50 meq via INTRAVENOUS

## 2019-07-31 MED ORDER — SODIUM ZIRCONIUM CYCLOSILICATE 5 G PO PACK
5.0000 g | PACK | Freq: Once | ORAL | Status: AC
  Administered 2019-07-31: 5 g via ORAL
  Filled 2019-07-31: qty 1

## 2019-07-31 MED ORDER — ALLOPURINOL 100 MG PO TABS
100.0000 mg | ORAL_TABLET | Freq: Every day | ORAL | Status: DC
  Administered 2019-07-31 – 2019-08-05 (×6): 100 mg via ORAL
  Filled 2019-07-31 (×6): qty 1

## 2019-07-31 MED ORDER — ACETAMINOPHEN 325 MG PO TABS
650.0000 mg | ORAL_TABLET | Freq: Once | ORAL | Status: AC
  Administered 2019-07-31: 650 mg via ORAL
  Filled 2019-07-31: qty 2

## 2019-07-31 NOTE — ED Provider Notes (Signed)
  Physical Exam  BP 93/82   Pulse 91   Temp (!) 100.4 F (38 C) (Oral)   Resp 12   Ht 6\' 1"  (1.854 m)   Wt 113.4 kg   SpO2 97%   BMI 32.98 kg/m   ED Course/Procedures     Procedures  MDM  Patient has a history of ESRD, Tuesday Thursday Saturday schedule, presenting today for altered mental status and new oxygen requirement of 2 L.  Reassess at the time of handoff.  Previous shift found patient to have sepsis of unknown organism, unknown source.  Labs showed hyperkalemia which was treated with calcium gluconate and bicarb amps.  Patient was febrile, normal white blood cell count, antibiotics initiated for possible sepsis considering new oxygen requirement.  Cefepime, vancomycin, Flagyl given.    Patient's CT scan has resulted as negative for acute abnormality in the abdomen.  He is still requiring 2 L nasal cannula oxygen.  Patient will be admitted to the hospitalist service for further evaluation and management.  Care of patient was discussed with the supervising attending.    Julianne Rice, MD 07/31/19 1654    Rex Kras, Wenda Overland, MD 08/03/19 [phone]

## 2019-07-31 NOTE — ED Provider Notes (Signed)
[facility] EMERGENCY DEPARTMENT Provider Note   CSN: [id number] Arrival date & time: 07/31/19  1047     History   Chief Complaint No chief complaint on file.   HPI [name] is a 68 y.o. male.     The history is provided by the patient, the EMS personnel, the spouse and medical records. No language interpreter was used.     68 year old male with history of diabetes, end-stage renal disease currently on Tuesday Thursday Saturday dialysis, CAD, CHF, dementia, depression brought here via EMS from home for evaluation of weakness and altered mental status.  History is limited as patient is altered.  Per EMS, patient had a full course of dialysis session yesterday however wife reported he has been feeling weak for the past few days, today he slipped off from his chair and appears altered.  When arrived, patient was complaining of pain to his abdomen.  His initial CBG was unremarkable.  He was mildly hypoxic on the monitor with poor waveform but was placed on 2 L of O2.  Past Medical History:  Diagnosis Date  . Acute encephalopathy 10/04/2018  . Allergic rhinitis, cause unspecified 02/24/2014  . Anemia 06/16/2011  . BENIGN PROSTATIC HYPERTROPHY 10/14/2009  . CAD, NATIVE VESSEL 02/06/2009   a. 06/2007 s/p Taxus DES to the RCA;  b. 08/2016 NSTEMI in setting of SVT/PCI: LM 30ost, LAD 84m (3.0x16 Synergy DES), LCX 9m, OM1 60, RI 40, RCA 70p/m, 15m - not amenable to PCI.  [name] Cervical radiculopathy, chronic 02/23/2016   Right c5-6 by NCS/EMG  . Chest pain 08/09/2015  . Chronic combined systolic (congestive) and diastolic (congestive) heart failure ([place])    a. 10/2016 Echo: EF 40-45%, Gr2 DD. mildly dil LA.  [name] COLONIC POLYPS, HX OF 10/14/2009  . Dementia ([place]) [id number]  . Depression 09/24/2015  . DIABETES MELLITUS, TYPE II 02/01/2010  . DYSLIPIDEMIA 06/18/2007  . ESRD (end stage renal disease) on dialysis ([place]) 08/04/2010   ESRD due to DM adn HTN, started HD in 2011. As of  2019 has a R arm graft and gets HD on TTS sched  . FOOT PAIN 08/12/2008  . GAIT DISTURBANCE 03/03/2010  . GASTROENTERITIS, VIRAL 10/14/2009  . GERD 06/18/2007  . GOITER, MULTINODULAR 12/26/2007  . GOUT 06/18/2007  . GYNECOMASTIA 07/17/2010  . Hemodialysis access, fistula mature [facility])    Dialysis T-Th-Sa ([place]) Right upper arm fistula  . Hyperlipidemia 10/16/2011  . Hyperparathyroidism, secondary ([place]) 06/16/2011  . HYPERTENSION 06/18/2007  . Hyperthyroidism   . Hypocalcemia 06/07/2010  . Ischemic cardiomyopathy    a. 10/2016 Echo: EF 40-45%.  . Lumbar stenosis with neurogenic claudication   . ONYCHOMYCOSIS, TOENAILS 12/26/2007  . OSA on CPAP 10/16/2011  . Other malaise and fatigue 11/24/2009  . PERIPHERAL NEUROPATHY 06/18/2007  . Prostate cancer ([place])   . PSVT (paroxysmal supraventricular tachycardia) ([place])    a. 41/6606 complicated by NSTEMI;  b. 11/2016 Treated w/ adenosine in ED;  c. 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  [name] PULMONARY NODULE, RIGHT LOWER LOBE 06/08/2009  . Sleep apnea    cpap machine and o2  . TRANSAMINASES, SERUM, ELEVATED 02/01/2010  . Transfusion history    none recent  . Unspecified hypotension 01/30/2010    Patient Active Problem List   Diagnosis Date Noted  . Atrial flutter ([place]) 07/04/2019  . Bilateral leg pain 07/04/2019  . RUQ pain 03/07/2019  . Common bile duct stone   . Dialysis AV fistula malfunction ([place])   .  Recurrent Clostridioides difficile diarrhea   . Septic shock ([place]) 11/11/2018  . Cerebral embolism with cerebral infarction 10/05/2018  . Acute hepatitis   . Left wrist pain 09/03/2018  . C. difficile colitis 09/03/2018  . Altered mental status   . Bacteremia   . Acute gouty arthritis 08/08/2018  . Sepsis ([place]) 07/16/2018  . Thrush 06/21/2018  . GI bleed 06/11/2018  . Recurrent falls 05/14/2018  . Chronic combined systolic (congestive) and diastolic (congestive) heart failure ([place]) 01/15/2018  . Abscess of left leg 10/14/2017  .  Insomnia 10/14/2017  . Lumbar stenosis with neurogenic claudication 07/31/2017  . Lightheadedness   . Rectal bleeding   . Gait disorder 05/05/2017  . Chronic GI bleeding 04/22/2017  . Malaise and fatigue 12/27/2016  . Generalized weakness 12/27/2016  . History of PSVT (paroxysmal supraventricular tachycardia) 10/16/2016  . Abdominal pain 08/27/2016  . Abnormal findings on diagnostic imaging of liver and biliary tract 08/27/2016  . Bloating symptom 08/27/2016  . Hematochezia 08/27/2016  . Esophageal ulcer 08/27/2016  . Flatulence, eructation and gas pain 08/27/2016  . General weakness 08/27/2016  . Skin sensation disturbance 08/27/2016  . Stricture of esophagus 08/27/2016  . Status post coronary artery stent placement   . ST segment depression 08/05/2016  . PAD (peripheral artery disease) ([place]) 06/18/2016  . Cervical radiculopathy, chronic 02/23/2016  . Memory loss 12/22/2015  . Left-sided weakness 12/22/2015  . Peripheral polyneuropathy 12/22/2015  . Nausea and vomiting 10/31/2015  . Malignant neoplasm of prostate ([place]) 10/24/2015  . S/P right total knee replacement 08/09/2015  . Primary osteoarthritis of right knee 08/05/2015  . Diabetes ([place]) 08/05/2015  . Hyperthyroidism 03/11/2015  . Nodule of right lung 09/14/2014  . PSA elevation 08/30/2014  . ESRD on hemodialysis ([place]) 08/16/2014  . Essential hypertension 08/16/2014  . Encounter for hemodialysis ([place]) 08/16/2014  . Allergic rhinitis 02/24/2014  . Vertigo 10/14/2013  . Cough 06/26/2013  . Numbness 05/15/2013  . Other complications due to renal dialysis device, implant, and graft 01/14/2013  . Right hand pain 03/28/2012  . Dysphagia 03/28/2012  . Hypogonadism, male 03/28/2012  . OSA (obstructive sleep apnea) 10/16/2011  . HLD (hyperlipidemia) 10/16/2011  . CAD S/P percutaneous coronary angioplasty 09/28/2011  . NSTEMI (non-ST elevated myocardial infarction) ([place]) 09/28/2011  . AVM (arteriovenous malformation)  09/27/2011  . Anemia associated with acute blood loss 09/26/2011  . Ischemic cardiomyopathy 06/16/2011  . Hyperparathyroidism, secondary ([place]) 06/16/2011  . Preventative health care 03/11/2011  . NECK PAIN 07/31/2010  . GYNECOMASTIA 07/17/2010  . DIZZINESS 07/17/2010  . GAIT DISTURBANCE 03/03/2010  . Thyrotoxicosis 02/02/2010  . Elevated levels of transaminase & lactic acid dehydrogenase 02/01/2010  . Other malaise and fatigue 11/24/2009  . Depression 10/14/2009  . BENIGN PROSTATIC HYPERTROPHY 10/14/2009  . COLONIC POLYPS, HX OF 10/14/2009  . Dementia ([place]) 09/02/2009  . PULMONARY NODULE, RIGHT LOWER LOBE 06/08/2009  . DYSPNEA 10/29/2008  . ONYCHOMYCOSIS, TOENAILS 12/26/2007  . GOITER, MULTINODULAR 12/26/2007  . Gout 06/18/2007  . Neuropathy ([place]) 06/18/2007  . Hypertensive heart disease with CHF (congestive heart failure) ([place]) 06/18/2007  . Gastro-esophageal reflux disease without esophagitis 06/18/2007    Past Surgical History:  Procedure Laterality Date  . A/V SHUNTOGRAM N/A 04/07/2018   Procedure: A/V SHUNTOGRAM;  Surgeon: Waynetta Sandy, MD;  Location: [place] CV LAB;  Service: Cardiovascular;  Laterality: N/A;  . ARTERIOVENOUS GRAFT PLACEMENT Right 2009   forearm/notes 02/01/2011  . AV FISTULA PLACEMENT  11/07/2011   Procedure: INSERTION OF ARTERIOVENOUS (AV) GORE-TEX  GRAFT ARM;  Surgeon: Tinnie Gens, MD;  Location: [place];  Service: Vascular;  Laterality: Left;  . BACK SURGERY  1998  . BALLOON DILATION N/A 11/14/2018   Procedure: BALLOON DILATION;  Surgeon: Ronnette Juniper, MD;  Location: [facility];  Service: Gastroenterology;  Laterality: N/A;  . BASCILIC VEIN TRANSPOSITION Right 02/27/2013   Procedure: BASCILIC VEIN TRANSPOSITION;  Surgeon: Mal Misty, MD;  Location: [facility];  Service: Vascular;  Laterality: Right;  Right Basilic Vein Transposition   . BILIARY DILATION  03/31/2019   Procedure: BILIARY DILATION;  Surgeon: Clarene Essex, MD;  Location[facility]  ENDOSCOPY;  Service: Endoscopy;;  . BILIARY DILATION  05/20/2019   Procedure: BILIARY DILATION;  Surgeon: Clarene Essex, MD;  Location: [facility] ENDOSCOPY;  Service: Endoscopy;;  . BILIARY STENT PLACEMENT  03/31/2019   Procedure: BILIARY STENT PLACEMENT;  Surgeon: Clarene Essex, MD;  Location: [facility] ENDOSCOPY;  Service: Endoscopy;;  . BILIARY STENT PLACEMENT N/A 05/20/2019   Procedure: BILIARY STENT PLACEMENT;  Surgeon: Clarene Essex, MD;  [facility] ENDOSCOPY;  Service: Endoscopy;  Laterality: N/A;  . CARDIAC CATHETERIZATION N/A 08/06/2016   Procedure: Left Heart Cath and Coronary Angiography;  Surgeon: Jolaine Artist, MD;  Location: [place] CV LAB;  Service: Cardiovascular;  Laterality: N/A;  . CARDIAC CATHETERIZATION N/A 08/07/2016   Procedure: Coronary/Graft Atherectomy-CSI LAD;  Surgeon: Peter M Martinique, MD;  Location: [facility] CV LAB;  Service: Cardiovascular;  Laterality: N/A;  . CERVICAL SPINE SURGERY  2/09   "to repair nerve problems in my left arm"  . CHOLECYSTECTOMY    . COLONOSCOPY WITH PROPOFOL N/A 04/26/2017   Procedure: COLONOSCOPY WITH PROPOFOL;  Surgeon: Otis Brace, MD;  Location: [facility];  Service: Gastroenterology;  Laterality: N/A;  . CORONARY ANGIOPLASTY WITH STENT PLACEMENT  06/11/2008  . CORONARY ANGIOPLASTY WITH STENT PLACEMENT  06/2007   TAXUS stent to RCA/notes 01/31/2011  . ENDOSCOPIC RETROGRADE CHOLANGIOPANCREATOGRAPHY (ERCP) WITH PROPOFOL N/A 03/31/2019   Procedure: ENDOSCOPIC RETROGRADE CHOLANGIOPANCREATOGRAPHY (ERCP) WITH PROPOFOL;  Surgeon: Clarene Essex, MD;  Location: [facility];  Service: Endoscopy;  Laterality: N/A;  . ERCP N/A 11/14/2018   Procedure: ENDOSCOPIC RETROGRADE CHOLANGIOPANCREATOGRAPHY (ERCP);  Surgeon: Ronnette Juniper, MD;  Location: [facility];  Service: Gastroenterology;  Laterality: N/A;  . ERCP N/A 05/20/2019   Procedure: ENDOSCOPIC RETROGRADE CHOLANGIOPANCREATOGRAPHY (ERCP);  Surgeon: Clarene Essex, MD;  Location[facility];  Service:  Endoscopy;  Laterality: N/A;  . ESOPHAGOGASTRODUODENOSCOPY  09/28/2011   Procedure: ESOPHAGOGASTRODUODENOSCOPY (EGD);  Surgeon: Missy Sabins, MD;  Location: [facility] ENDOSCOPY;  Service: Endoscopy;  Laterality: N/A;  . ESOPHAGOGASTRODUODENOSCOPY N/A 04/07/2015   Procedure: ESOPHAGOGASTRODUODENOSCOPY (EGD);  Surgeon: Teena Irani, MD;  Location: [facility];  Service: Endoscopy;  Laterality: N/A;  . ESOPHAGOGASTRODUODENOSCOPY N/A 04/19/2015   Procedure: ESOPHAGOGASTRODUODENOSCOPY (EGD);  Surgeon: Arta Silence, MD;  [facility] ENDOSCOPY;  Service: Endoscopy;  Laterality: N/A;  . ESOPHAGOGASTRODUODENOSCOPY  11/14/2018   Procedure: ESOPHAGOGASTRODUODENOSCOPY (EGD);  Surgeon: Ronnette Juniper, MD;  Location: [facility] ENDOSCOPY;  Service: Gastroenterology;;  . ESOPHAGOGASTRODUODENOSCOPY (EGD) WITH PROPOFOL N/A 06/13/2018   Procedure: ESOPHAGOGASTRODUODENOSCOPY (EGD) WITH PROPOFOL;  Surgeon: Wonda Horner, MD;  Location: [facility] ENDOSCOPY;  Service: Endoscopy;  Laterality: N/A;  . FLEXIBLE SIGMOIDOSCOPY N/A 05/21/2017   Procedure: Conni Elliot;  Surgeon: Clarene Essex, MD;  Location: [place];  Service: Endoscopy;  Laterality: N/A;  . FLEXIBLE SIGMOIDOSCOPY Left 07/02/2017   Procedure: FLEXIBLE SIGMOIDOSCOPY;  Surgeon: Laurence Spates, MD;  Location: [facility];  Service: Endoscopy;  Laterality: Left;  . FOREIGN BODY REMOVAL  09/2003  via upper endoscopy/notes 02/12/2011  . GIVENS CAPSULE STUDY  09/30/2011   Procedure: GIVENS CAPSULE STUDY;  Surgeon: Jeryl Columbia, MD;  Location: [facility] ENDOSCOPY;  Service: Endoscopy;  Laterality: N/A;  . INSERTION OF DIALYSIS CATHETER Right 2014  . INSERTION OF DIALYSIS CATHETER Left 02/11/2013   Procedure: INSERTION OF DIALYSIS CATHETER;  Surgeon: Conrad Langdon Place, MD;  Location: [place];  Service: Vascular;  Laterality: Left;  Ultrasound guided  . IR AV DIALY SHUNT INTRO NEEDLE/INTRACATH INITIAL W/PTA/IMG RIGHT Right 11/14/2018  . IR US GUIDE VASC ACCESS RIGHT  11/14/2018  . LUMBAR  LAMINECTOMY/DECOMPRESSION MICRODISCECTOMY Bilateral 07/31/2017   Procedure: LAMINECTOMY AND FORAMINOTOMY- BILATERAL LUMBAR TWO- LUMBAR THREE;  Surgeon: Earnie Larsson, MD;  Location: [place];  Service: Neurosurgery;  Laterality: Bilateral;  LAMINECTOMY AND FORAMINOTOMY- BILATERAL LUMBAR 2- LUMBAR 3  . PERIPHERAL VASCULAR BALLOON ANGIOPLASTY  04/07/2018   Procedure: PERIPHERAL VASCULAR BALLOON ANGIOPLASTY;  Surgeon: Waynetta Sandy, MD;  Location: [place] CV LAB;  Service: Cardiovascular;;  RUE AVF  . REMOVAL OF A DIALYSIS CATHETER Right 02/11/2013   Procedure: REMOVAL OF A DIALYSIS CATHETER;  Surgeon: Conrad Newcastle, MD;  Location: [place];  Service: Vascular;  Laterality: Right;  . REMOVAL OF STONES  11/14/2018   Procedure: REMOVAL OF STONES;  Surgeon: Ronnette Juniper, MD;  Location: [facility] ENDOSCOPY;  Service: Gastroenterology;;  . REMOVAL OF STONES  03/31/2019   Procedure: REMOVAL OF STONES;  Surgeon: Clarene Essex, MD;  Location: [place];  Service: Endoscopy;;  . Azzie Almas DILATION N/A 04/07/2015   Procedure: Azzie Almas DILATION;  Surgeon: Teena Irani, MD;  [facility] ENDOSCOPY;  Service: Endoscopy;  Laterality: N/A;  . SHUNTOGRAM N/A 09/20/2011   Procedure: Earney Mallet;  Surgeon: Conrad Penryn, MD;  Location: [facility] CATH LAB;  Service: Cardiovascular;  Laterality: N/A;  . SPHINCTEROTOMY  11/14/2018   Procedure: SPHINCTEROTOMY;  Surgeon: Ronnette Juniper, MD;  Location: [facility] ENDOSCOPY;  Service: Gastroenterology;;  . Joan Mayans  03/31/2019   Procedure: Joan Mayans;  Surgeon: Clarene Essex, MD;  Location: [facility] ENDOSCOPY;  Service: Endoscopy;;  . SPHINCTEROTOMY  05/20/2019   Procedure: Joan Mayans;  Surgeon: Clarene Essex, MD;  [facility] ENDOSCOPY;  Service: Endoscopy;;  . SPYGLASS CHOLANGIOSCOPY N/A 05/20/2019   Procedure: JMEQASTM CHOLANGIOSCOPY;  Surgeon: Clarene Essex, MD;  [facility] ENDOS[facility];  Service: Endoscopy;  Laterality: N/A;  Bess Kinds LITHOTRIPSY N/A 05/20/2019   Procedure: HDQQIWLN LITHOTRIPSY;   Surgeon: Clarene Essex, MD;  [facility] ENDOSCOPY;  Service: Endoscopy;  Laterality: N/A;  . STENT REMOVAL  05/20/2019   Procedure: STENT REMOVAL;  Surgeon: Clarene Essex, MD;  [facility] ENDOSCOPY;  Service: Endoscopy;;  . SVT ABLATION N/A 11/26/2016   Procedure: SVT Ablation;  Surgeon: Will Meredith Leeds, MD;  Location: [place] CV LAB;  Service: Cardiovascular;  Laterality: N/A;  . TEE WITHOUT CARDIOVERSION N/A 08/25/2018   Procedure: TRANSESOPHAGEAL ECHOCARDIOGRAM (TEE);  Surgeon: Dorothy Spark, MD;  Location: [place];  Service: Cardiovascular;  Laterality: N/A;  . TEE WITHOUT CARDIOVERSION N/A 04/02/2019   Procedure: TRANSESOPHAGEAL ECHOCARDIOGRAM (TEE);  Surgeon: Josue Hector, MD;  Location: [facility] ENDOSCOPY;  Service: Cardiovascular;  Laterality: N/A;  . TONSILLECTOMY    . TOTAL KNEE ARTHROPLASTY Right 08/02/2015   Procedure: TOTAL KNEE ARTHROPLASTY;  Surgeon: Renette Butters, MD;  Location: [place];  Service: Orthopedics;  Laterality: Right;  . VENOGRAM N/A 01/26/2013   Procedure: VENOGRAM;  Surgeon: Angelia Mould, MD;  Location: [facility] CATH LAB;  Service: Cardiovascular;  Laterality: N/A;  Home Medications    Prior to Admission medications   Medication Sig Start Date End Date Taking? Authorizing Provider  acetaminophen (TYLENOL) 325 MG tablet Take 325 mg by mouth every 6 (six) hours as needed for mild pain or headache.     [provider]  Alirocumab (PRALUENT) 150 MG/ML SOAJ Inject 1 pen into the skin every 14 (fourteen) days. 04/08/19   Lorretta Harp, MD  allopurinol (ZYLOPRIM) 100 MG tablet Take 1 tablet by mouth once daily 06/12/19   Biagio Borg, MD  aspirin EC 325 MG tablet Take 1 tablet (325 mg total) by mouth daily. 10/10/18   Hongalgi, Lenis Dickinson, MD  betamethasone dipropionate (DIPROLENE) 0.05 % cream Apply 1 application topically daily as needed (leg sores).    [provider]  citalopram (CELEXA) 20 MG tablet Take 20 mg by mouth at  bedtime.    [provider]  colestipol (COLESTID) 1 g tablet Take 3 tablets (3 g total) by mouth daily. 02/18/19   Renato Shin, MD  doxercalciferol ([name]) 4 MCG/2ML injection Inject 1 mL (2 mcg total) into the vein Every Tuesday,Thursday,and Saturday with dialysis. 09/02/18   Kayleen Memos, DO  ezetimibe (ZETIA) 10 MG tablet Take 10 mg by mouth every evening.    [provider]  ferric citrate (AURYXIA) 1 GM 210 MG(Fe) tablet Take 630 mg by mouth 3 (three) times daily with meals.    [provider]  gabapentin (NEURONTIN) 300 MG capsule TAKE 1 TO 2 CAPSULES BY MOUTH AT BEDTIME AS NEEDED FOR PAIN AND FOR SLEEP 06/25/19   Biagio Borg, MD  hydroxypropyl methylcellulose / hypromellose (ISOPTO TEARS / GONIOVISC) 2.5 % ophthalmic solution Place 1 drop into both eyes 3 (three) times daily as needed for dry eyes. 05/03/17   Biagio Borg, MD  loperamide (IMODIUM) 2 MG capsule Take 2 mg by mouth 4 (four) times daily as needed for diarrhea or loose stools.    [provider]  methimazole (TAPAZOLE) 5 MG tablet Take 1/2 (one-half) tablet by mouth once daily 07/13/19   Renato Shin, MD  midodrine (PROAMATINE) 10 MG tablet Take 10mg  every Tuesday, Thursday and Saturday    [provider]  multivitamin (RENA-VIT) TABS tablet Take 1 tablet by mouth daily.     [provider]  pantoprazole (PROTONIX) 40 MG tablet Take 40 mg by mouth at bedtime.  11/03/18   [provider]  polyethylene glycol (MIRALAX / GLYCOLAX) 17 g packet Take 17 g by mouth daily as needed for mild constipation. 04/02/19   Aline August, MD  SENSIPAR 90 MG tablet Take 90 mg by mouth 2 (two) times daily. 09/18/16   [provider]  traMADol (ULTRAM) 50 MG tablet Take 50 mg by mouth every 6 (six) hours as needed.    [provider]  zolpidem (AMBIEN) 5 MG tablet Take 1 tablet (5 mg total) by mouth at bedtime as needed. for sleep 07/03/19   Biagio Borg, MD     Family History Family History  Problem Relation Age of Onset  . Heart disease Sister   . Thyroid nodules Sister   . Heart disease Father   . Diabetes Father   . Kidney failure Father   . Hypertension Father   . Healthy Child   . Healthy Child   . Healthy Child   . Cancer Neg Hx     Social History Social History   Tobacco Use  . Smoking status: Former Smoker  Packs/day: 1.00    Years: 25.00    Pack years: 25.00    Types: Cigarettes    Quit date: 10/01/2005    Years since quitting: 13.8  . Smokeless tobacco: Former Systems developer    Quit date: 10/01/2005  . Tobacco comment: Quit smoking 2007 Smoked x 25 years 1/2 ppd.  Substance Use Topics  . Alcohol use: No    Alcohol/week: 0.0 standard drinks  . Drug use: No     Allergies   Statins and Ciprofloxacin   Review of Systems Review of Systems  Unable to perform ROS: Mental status change     Physical Exam Updated Vital Signs BP 93/82   Pulse 91   Temp (!) 100.4 F (38 C) (Oral)   Resp 12   Ht 6\' 1"  (1.854 m)   Wt 113.4 kg   SpO2 97%   BMI 32.98 kg/m   Physical Exam Vitals signs and nursing note reviewed.  Constitutional:      General: He is not in acute distress.    Appearance: He is well-developed. He is obese.  HENT:     Head: Atraumatic.  Eyes:     Extraocular Movements: Extraocular movements intact.     Conjunctiva/sclera: Conjunctivae normal.     Pupils: Pupils are equal, round, and reactive to light.  Neck:     Musculoskeletal: Neck supple.  Cardiovascular:     Rate and Rhythm: Normal rate and regular rhythm.     Pulses: Normal pulses.     Heart sounds: Normal heart sounds.  Abdominal:     Palpations: Abdomen is soft.     Tenderness: There is abdominal tenderness (Diffuse abdominal tenderness without guarding or rebound tenderness.).  Musculoskeletal:        General: No swelling.  Skin:    Findings: No rash.  Neurological:     Mental Status: He is alert.     Comments: Drowsy but able to answer  questions, A&O x 2.  Able to move all 4 extremities with poor effort.       ED Treatments / Results  Labs (all labs ordered are listed, but only abnormal results are displayed) Labs Reviewed  CBC WITH DIFFERENTIAL/PLATELET - Abnormal; Notable for the following components:      Result Value   RDW 16.6 (*)    Neutro Abs 8.1 (*)    Lymphs Abs 0.6 (*)    Monocytes Absolute 1.2 (*)    All other components within normal limits  COMPREHENSIVE METABOLIC PANEL - Abnormal; Notable for the following components:   Potassium 5.7 (*)    Chloride 95 (*)    Glucose, Bld 148 (*)    BUN 41 (*)    Creatinine, Ser 8.24 (*)    Albumin 3.2 (*)    ALT 63 (*)    Alkaline Phosphatase 147 (*)    GFR calc non Af Amer 6 (*)    GFR calc Af Amer 7 (*)    Anion gap 18 (*)    All other components within normal limits  LACTIC ACID, PLASMA - Abnormal; Notable for the following components:   Lactic Acid, Venous 2.0 (*)    All other components within normal limits  CBG MONITORING, ED - Abnormal; Notable for the following components:   Glucose-Capillary 130 (*)    All other components within normal limits  I-STAT CHEM 8, ED - Abnormal; Notable for the following components:   Potassium 6.8 (*)    BUN 53 (*)    Creatinine,  Ser 7.10 (*)    Glucose, Bld 123 (*)    Calcium, Ion 0.80 (*)    Hemoglobin 10.2 (*)    HCT 30.0 (*)    All other components within normal limits  TROPONIN I (HIGH SENSITIVITY) - Abnormal; Notable for the following components:   Troponin I (High Sensitivity) 32 (*)    All other components within normal limits  CULTURE, BLOOD (ROUTINE X 2)  CULTURE, BLOOD (ROUTINE X 2)  SARS CORONAVIRUS 2 BY RT PCR (HOSPITAL ORDER, [place] LAB)  ETHANOL  APTT  PROTIME-INR  LIPASE, BLOOD  URINALYSIS, ROUTINE W REFLEX MICROSCOPIC  RAPID URINE DRUG SCREEN, HOSP PERFORMED  TROPONIN I (HIGH SENSITIVITY)    EKG EKG Interpretation  Date/Time:  Friday July 31 2019  11:02:16 EDT Ventricular Rate:  106 PR Interval:    QRS Duration: 99 QT Interval:  338 QTC Calculation: 449 R Axis:   45 Text Interpretation: Sinus tachycardia Multiple ventricular premature complexes Baseline wander in lead(s) V2 No significant change since last tracing Confirmed by Wandra Arthurs [phone]) on 07/31/2019 1:30:42 PM   Radiology Dg Chest Portable 1 View  Result Date: 07/31/2019 CLINICAL DATA:  Chest pain, fever, dialysis patient. EXAM: PORTABLE CHEST 1 VIEW COMPARISON:  07/03/2019 FINDINGS: Borderline enlarged cardiac silhouette. The aorta remains tortuous and calcified. Stable linear scarring in the left mid lung zone. Mildly prominent interstitial markings with improvement. Cervical spine fixation hardware. Right shoulder degenerative changes. IMPRESSION: 1. No acute abnormality. 2. Stable borderline cardiomegaly. 3. Mild chronic interstitial lung disease. Electronically Signed   By: Claudie Revering M.D.   On: 07/31/2019 11:49    Procedures .Critical Care Performed by: Domenic Moras, PA-C Authorized by: Domenic Moras, PA-C   Critical care provider statement:    Critical care time (minutes):  45   Critical care was time spent personally by me on the following activities:  Discussions with consultants, evaluation of patient's response to treatment, examination of patient, ordering and performing treatments and interventions, ordering and review of laboratory studies, ordering and review of radiographic studies, pulse oximetry, re-evaluation of patient's condition, obtaining history from patient or surrogate and review of old charts   (including critical care time)  Medications Ordered in ED Medications  metroNIDAZOLE (FLAGYL) IVPB 500 mg (has no administration in time range)  vancomycin (VANCOCIN) 2,500 mg in sodium chloride 0.9 % 500 mL IVPB (has no administration in time range)  ceFEPIme (MAXIPIME) 1 g in sodium chloride 0.9 % 100 mL IVPB (0 g Intravenous Stopped 07/31/19 1401)   sodium bicarbonate injection 50 mEq (50 mEq Intravenous Given 07/31/19 1346)  calcium gluconate 1 g in sodium chloride 0.9 % 100 mL IVPB (1 g Intravenous New Bag/Given 07/31/19 1351)  sodium zirconium cyclosilicate (LOKELMA) packet 5 g (5 g Oral Given 07/31/19 1407)  sodium chloride 0.9 % bolus 500 mL (0 mLs Intravenous Stopped 07/31/19 1345)  ceFEPIme (MAXIPIME) 1 g in sodium chloride 0.9 % 100 mL IVPB (1 g Intravenous New Bag/Given 07/31/19 1402)     Initial Impression / Assessment and Plan / ED Course  I have reviewed the triage vital signs and the nursing notes.  Pertinent labs & imaging results that were available during my care of the patient were reviewed by me and considered in my medical decision making (see chart for details).        BP 93/82   Pulse 91   Temp (!) 100.4 F (38 C) (Oral)   Resp 12  Ht 6\' 1"  (1.854 m)   Wt 113.4 kg   SpO2 97%   BMI 32.98 kg/m    Final Clinical Impressions(s) / ED Diagnoses   Final diagnoses:  Sepsis, due to unspecified organism, unspecified whether acute organ dysfunction present [facility])    ED Discharge Orders    None     11:15 AM Patient here due to abdominal pain, altered mental status and generalized weakness.  I was able to speak with patient's wife on the phone.  She mention patient has been have abdominal pain for about 2 days and states that he has history of gallstone requiring GI specialist Dr. Watt Climes to extracted, as recent as in August.  He has had prior cholecystectomy.  Patient does appears to be altered.  He does have a low-grade temperature of 100.4, tachycardic, or hypotensive concerning for sepsis.  He also endorsed chest pain.  Work-up initiated.  2:54 PM Patient with finding concerning for sepsis, code sepsis initiated.  He was febrile, hypotensive, tachycardic and mildly tachypneic initially.  Broad-spectrum antibiotic was initiated.  Labs remarkable for an elevated potassium of 6.8 although he reportedly had a  full dialysis session yesterday.  I discussed care with Dr. Darl Householder and we have initiated treatment for his hyperkalemia without significant EKG changes.  I also have consulted dialysis specialist, Dr. Melvia Heaps who will be involved in patient care.  COVID-19 test is negative, normal white count, mildly elevated lactic acid of 2.0, mild elevated troponin of 32 chest x-ray without acute finding.  Since his primary complaint is abdominal discomfort, along with history of recurrent gallstones, will obtain abdominal pelvic CT scan for evaluation.  He does not have any nuchal rigidity concerning for meningitis.  3:26 PM Pt sign out to oncoming resident who will f/u on CT scan result.  Pt will be admitted for sepsis of questionable source however pt has had recurrent abdominal infection related infected gallstone.  Pt does not make urine.     Domenic Moras, PA-C 07/31/19 1528    Drenda Freeze, MD 08/04/19 1314

## 2019-07-31 NOTE — ED Triage Notes (Signed)
Wife called EMS with c/o generalized weakness after dialysis yesterday.   RUQ abd pain extending into R flank, hx of gall stones and also noted pain on urination.  Alert and oriented x 2, difficult in answering questions.

## 2019-07-31 NOTE — Progress Notes (Addendum)
Pharmacy Antibiotic Note  [name] is a 68 y.o. male with PMH of DM, ESRD on HD TuThSat, CAD, CHF, dementia, depression, who presents after weakness and AMS. There is concern for sepsis. Pt Pharmacy has been consulted for vancomycin and cefepime dosing.  WBC is wnls at 10, patient has a low-grade fever of 100.4. Lactate is elevated at 2.0. Pt is hypotensive at 92/44.  Plan: Cefepime IV 2g x 1 as loading dose Cefepime IV 1g q24 hours as maintenance dose Vancomycin 2500 mg IV x 1 as loading dose F/U HD schedule Continue Flagyl per MD Monitor WBC, Temp, and clinical status  Height: 6\' 1"  (185.4 cm) Weight: 250 lb (113.4 kg) IBW/kg (Calculated) : 79.9  Temp (24hrs), Avg:100.4 F (38 C), Min:100.4 F (38 C), Max:100.4 F (38 C)  Recent Labs  Lab 07/31/19 1213 07/31/19 1251  WBC 10.0  --   CREATININE 8.24* 7.10*  LATICACIDVEN 2.0*  --     Estimated Creatinine Clearance: 12.8 mL/min (A) (by C-G formula based on SCr of 7.1 mg/dL (H)).    Allergies  Allergen Reactions  . Statins Other (See Comments)    Weak muscles  . Ciprofloxacin Rash    Antimicrobials this admission: Vancomycin 10/30 >>  Cefepime 10/30 >>  Flagyl 10/30 >>  Microbiology results: 10/30 BCx: Sent 10/30 COVID: Sent  Thank you for allowing pharmacy to be a part of this patient's care.  Sherren Kerns, PharmD PGY1 Acute Care Pharmacy Resident 07/31/2019 1:36 PM

## 2019-07-31 NOTE — ED Notes (Signed)
Patient transported to CT 

## 2019-07-31 NOTE — H&P (Addendum)
History and Physical  [name] FTD:[id number] DOB: Nov 08, 1950 DOA: 07/31/2019  Patient coming from: Home  Chief Complaint: AMS  HPI: [name] is a 68 y.o. male with medical history significant for diabetes mellitus type 2, ESRD on HD T/TH/S, CAD, CHF, hypothyroidism, gout, dementia, depression, presents to the ED complaining of generalized weakness and altered mental status for the past few days. Patient appeared to have improved, able to provide history. Pt reported to have been progressively feeling weak overall, with increasing confusion as well as complains of abdominal pain, worse on the RU & RL quadrant. Patient denies any nausea/vomiting, diarrhea, chest pain, shortness of breath, or cough.  Of note, patient was recently discharged on 04/02/2019 after presenting with severe E. coli sepsis.  Also found at that admission was numerous gallstones throughout the common bile ducts status post ERCP with stent placement.  Patient was also seen as an outpatient with GI Dr. Watt Climes, who repeated another ERCP on 05/20/2019 with stent placed in the CBD and sphincterotomy done.  Patient was advised to come back in 1 to 2 months for another repeat ERCP as numerous stones were seen in the CBD, and they were unsure if all were taken off.  ED Course: Fever of 100.4, mildly tachycardic with heart rate in the 100s, hypotensive, saturating well on RA.  Labs show no leukocytosis, lactic acid of 2, troponin 32, potassium of 6.8, COVID-19 test negative, chest x-ray no acute abnormality, only chronic interstitial lung disease, CT renal stone showed no acute abnormality, CT head unremarkable, BC x2 pending, patient does not make urine.  Patient was given calcium gluconate and bicarb amps for his hyperkalemia, started on broad-spectrum antibiotics for possible sepsis  Review of Systems: Review of systems are otherwise negative   Past Medical History:  Diagnosis Date   Acute encephalopathy 10/04/2018   Allergic  rhinitis, cause unspecified 02/24/2014   Anemia 06/16/2011   BENIGN PROSTATIC HYPERTROPHY 10/14/2009   CAD, NATIVE VESSEL 02/06/2009   a. 06/2007 s/p Taxus DES to the RCA;  b. 08/2016 NSTEMI in setting of SVT/PCI: LM 30ost, LAD 53m (3.0x16 Synergy DES), LCX 53m, OM1 60, RI 40, RCA 70p/m, 12m - not amenable to PCI.   Cervical radiculopathy, chronic 02/23/2016   Right c5-6 by NCS/EMG   Chest pain 08/09/2015   Chronic combined systolic (congestive) and diastolic (congestive) heart failure ([place])    a. 10/2016 Echo: EF 40-45%, Gr2 DD. mildly dil LA.   COLONIC POLYPS, HX OF 10/14/2009   Dementia ([place]) [id number]   Depression 09/24/2015   DIABETES MELLITUS, TYPE II 02/01/2010   DYSLIPIDEMIA 06/18/2007   ESRD (end stage renal disease) on dialysis ([place]) 08/04/2010   ESRD due to DM adn HTN, started HD in 2011. As of 2019 has a R arm graft and gets HD on TTS sched   FOOT PAIN 08/12/2008   GAIT DISTURBANCE 03/03/2010   GASTROENTERITIS, VIRAL 10/14/2009   GERD 06/18/2007   GOITER, MULTINODULAR 12/26/2007   GOUT 06/18/2007   GYNECOMASTIA 07/17/2010   Hemodialysis access, fistula mature [facility])    Dialysis T-Th-Sa ([place]) Right upper arm fistula   Hyperlipidemia 10/16/2011   Hyperparathyroidism, secondary ([place]) 06/16/2011   HYPERTENSION 06/18/2007   Hyperthyroidism    Hypocalcemia 06/07/2010   Ischemic cardiomyopathy    a. 10/2016 Echo: EF 40-45%.   Lumbar stenosis with neurogenic claudication    ONYCHOMYCOSIS, TOENAILS 12/26/2007   OSA on CPAP 10/16/2011   Other malaise and fatigue 11/24/2009   PERIPHERAL  NEUROPATHY 06/18/2007   Prostate cancer ([place])    PSVT (paroxysmal supraventricular tachycardia) ([place])    a. 75/6433 complicated by NSTEMI;  b. 11/2016 Treated w/ adenosine in ED;  c. 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   PULMONARY NODULE, RIGHT LOWER LOBE 06/08/2009   Sleep apnea    cpap machine and o2   TRANSAMINASES, SERUM, ELEVATED 02/01/2010   Transfusion history      none recent   Unspecified hypotension 01/30/2010   Past Surgical History:  Procedure Laterality Date   A/V SHUNTOGRAM N/A 04/07/2018   Procedure: A/V SHUNTOGRAM;  Surgeon: Waynetta Sandy, MD;  Location: [place] [facility];  Service: Cardiovascular;  Laterality: N/A;   ARTERIOVENOUS GRAFT PLACEMENT Right 2009   forearm/notes 02/01/2011   AV FISTULA PLACEMENT  11/07/2011   Procedure: INSERTION OF ARTERIOVENOUS (AV) GORE-TEX GRAFT ARM;  Surgeon: Tinnie Gens, MD;  Location: [place];  Service: Vascular;  Laterality: Left;   [place] N/A 11/14/2018   Procedure: BALLOON DILATION;  Surgeon: Ronnette Juniper, MD;  Location: [place];  Service: Gastroenterology;  Laterality: N/A;   BASCILIC VEIN TRANSPOSITION Right 02/27/2013   Procedure: BASCILIC VEIN TRANSPOSITION;  Surgeon: Mal Misty, MD;  Location: [place];  Service: Vascular;  Laterality: Right;  Right Basilic Vein Transposition    BILIARY DILATION  03/31/2019   Procedure: BILIARY DILATION;  Surgeon: Clarene Essex, MD;  Location: [facility] ENDOSCOPY;  Service: Endoscopy;;   BILIARY DILATION  05/20/2019   Procedure: BILIARY DILATION;  Surgeon: Clarene Essex, MD;  Location: [facility] ENDOSCOPY;  Service: Endoscopy;;   BILIARY STENT PLACEMENT  03/31/2019   Procedure: BILIARY STENT PLACEMENT;  Surgeon: Clarene Essex, MD;  Location: [place];  Service: Endoscopy;;   BILIARY STENT PLACEMENT N/A 05/20/2019   Procedure: BILIARY STENT PLACEMENT;  Surgeon: Clarene Essex, MD;  [facility]OS[facility];  Service: Endoscopy;  Laterality: N/A;   CARDIAC CATHETERIZATION N/A 08/06/2016   Procedure: Left Heart Cath and Coronary Angiography;  Surgeon: Jolaine Artist, MD;  Location: [place] [facility];  Service: Cardiovascular;  Laterality: N/A;   CARDIAC CATHETERIZATION N/A 08/07/2016   Procedure: Coronary/Graft Atherectomy-CSI LAD;  Surgeon: Peter M Martinique, MD;  Location: [place] [facility];  Service: Cardiovascular;  Laterality: N/A;    CERVICAL SPINE SURGERY  2/09   "to repair nerve problems in my left arm"   CHOLECYSTECTOMY     COLONOSCOPY WITH PROPOFOL N/A 04/26/2017   Procedure: COLONOSCOPY WITH PROPOFOL;  Surgeon: Otis Brace, MD;  Location: [place];  Service: Gastroenterology;  Laterality: N/A;   CORONARY ANGIOPLASTY WITH STENT PLACEMENT  06/11/2008   CORONARY ANGIOPLASTY WITH STENT PLACEMENT  06/2007   TAXUS stent to RCA/notes 01/31/2011   ENDOSCOPIC RETROGRADE CHOLANGIOPANCREATOGRAPHY (ERCP) WITH PROPOFOL N/A 03/31/2019   Procedure: ENDOSCOPIC RETROGRADE CHOLANGIOPANCREATOGRAPHY (ERCP) WITH PROPOFOL;  Surgeon: Clarene Essex, MD;  Location: [place];  Service: Endoscopy;  Laterality: N/A;   ERCP N/A 11/14/2018   Procedure: ENDOSCOPIC RETROGRADE CHOLANGIOPANCREATOGRAPHY (ERCP);  Surgeon: Ronnette Juniper, MD;  Location: [place];  Service: Gastroenterology;  Laterality: N/A;   ERCP N/A 05/20/2019   Procedure: ENDOSCOPIC RETROGRADE CHOLANGIOPANCREATOGRAPHY (ERCP);  Surgeon: Clarene Essex, MD;  Location: [facility];  Service: Endoscopy;  Laterality: N/A;   ESOPHAGOGASTRODUODENOSCOPY  09/28/2011   Procedure: ESOPHAGOGASTRODUODENOSCOPY (EGD);  Surgeon: Missy Sabins, MD;  Location: [facility] ENDOSCOPY;  Service: Endoscopy;  Laterality: N/A;   ESOPHAGOGASTRODUODENOSCOPY N/A 04/07/2015   Procedure: ESOPHAGOGASTRODUODENOSCOPY (EGD);  Surgeon: Teena Irani, MD;  Location: [facility];  Service:  Endoscopy;  Laterality: N/A;   ESOPHAGOGASTRODUODENOSCOPY N/A 04/19/2015   Procedure: ESOPHAGOGASTRODUODENOSCOPY (EGD);  Surgeon: Arta Silence, MD;  Location: [facility] ENDOSCOPY;  Service: Endoscopy;  Laterality: N/A;   ESOPHAGOGASTRODUODENOSCOPY  11/14/2018   Procedure: ESOPHAGOGASTRODUODENOSCOPY (EGD);  Surgeon: Ronnette Juniper, MD;  Location: [facility]COPY;  Service: Gastroenterology;;   ESOPHAGOGASTRODUODENOSCOPY (EGD) WITH PROPOFOL N/A 06/13/2018   Procedure: ESOPHAGOGASTRODUODENOSCOPY (EGD) WITH PROPOFOL;  Surgeon: Wonda Horner, MD;   Location: [facility] ENDOSCOPY;  Service: Endoscopy;  Laterality: N/A;   FLEXIBLE SIGMOIDOSCOPY N/A 05/21/2017   Procedure: Conni Elliot;  Surgeon: Clarene Essex, MD;  Location: [place];  Service: Endoscopy;  Laterality: N/A;   FLEXIBLE SIGMOIDOSCOPY Left 07/02/2017   Procedure: FLEXIBLE SIGMOIDOSCOPY;  Surgeon: Laurence Spates, MD;  Location: [place];  Service: Endoscopy;  Laterality: Left;   FOREIGN BODY REMOVAL  09/2003   via upper endoscopy/notes 02/12/2011   GIVENS CAPSULE STUDY  09/30/2011   Procedure: GIVENS CAPSULE STUDY;  Surgeon: Jeryl [place], MD;  Location: [facility] ENDOSCOPY;  Service: Endoscopy;  Laterality: N/A;   INSERTION OF DIALYSIS CATHETER Right 2014   INSERTION OF DIALYSIS CATHETER Left 02/11/2013   Procedure: INSERTION OF DIALYSIS CATHETER;  Surgeon: Conrad Blaine, MD;  Location: [place];  Service: Vascular;  Laterality: Left;  Ultrasound guided   IR AV DIALY SHUNT INTRO NEEDLE/INTRACATH INITIAL W/PTA/IMG RIGHT Right 11/14/2018   IR US GUIDE VASC ACCESS RIGHT  11/14/2018   LUMBAR LAMINECTOMY/DECOMPRESSION MICRODISCECTOMY Bilateral 07/31/2017   Procedure: LAMINECTOMY AND FORAMINOTOMY- BILATERAL LUMBAR TWO- LUMBAR THREE;  Surgeon: Earnie Larsson, MD;  Location: [place];  Service: Neurosurgery;  Laterality: Bilateral;  LAMINECTOMY AND FORAMINOTOMY- BILATERAL LUMBAR 2- LUMBAR 3   PERIPHERAL VASCULAR BALLOON ANGIOPLASTY  04/07/2018   Procedure: PERIPHERAL VASCULAR BALLOON ANGIOPLASTY;  Surgeon: Waynetta Sandy, MD;  Location: [place] [facility];  Service: Cardiovascular;;  RUE AVF   REMOVAL OF A DIALYSIS CATHETER Right 02/11/2013   Procedure: REMOVAL OF A DIALYSIS CATHETER;  Surgeon: Conrad Be[place], MD;  Location: [place];  Service: Vascular;  Laterality: Right;   REMOVAL OF STONES  11/14/2018   Procedure: REMOVAL OF STONES;  Surgeon: Ronnette Juniper, MD;  Location: [facility] ENDOSCOPY;  Service: Gastroenterology;;   REMOVAL OF STONES  03/31/2019   Procedure: REMOVAL OF STONES;   Surgeon: Clarene Essex, MD;  Location: [place];  Service: Endoscopy;;   SAVORY DILATION N/A 04/07/2015   Procedure: Azzie Almas DILATION;  Surgeon: Teena Irani, MD;  [facility]OS[facility];  Service: Endoscopy;  Laterality: N/A;   SHUNTOGRAM N/A 09/20/2011   Procedure: Earney Mallet;  Surgeon: Conrad Woodbine, MD;  Location[facility] CATH LAB;  Service: Cardiovascular;  Laterality: N/A;   SPHINCTEROTOMY  11/14/2018   Procedure: SPHINCTEROTOMY;  Surgeon: Ronnette Juniper, MD;  Location: [facility] ENDOSCOPY;  Service: Gastroenterology;;   Joan Mayans  03/31/2019   Procedure: Joan Mayans;  Surgeon: Clarene Essex, MD;  Location: [facility] ENDOSCOPY;  Service: Endoscopy;;   SPHINCTEROTOMY  05/20/2019   Procedure: Joan Mayans;  Surgeon: Clarene Essex, MD;  [facility];  Service: Endoscopy;;   SPYGLASS CHOLANGIOSCOPY N/A 05/20/2019   Procedure: SLHTDSKA CHOLANGIOSCOPY;  Surgeon: Clarene Essex, MD;  [facility]OS[facility];  Service: Endoscopy;  Laterality: N/A;   SPYGLASS LITHOTRIPSY N/A 05/20/2019   Procedure: [id number] LITHOTRIPSY;  Surgeon: Clarene Essex, MD;  [facility];  Service: Endoscopy;  Laterality: N/A;   STENT REMOVAL  05/20/2019   Procedure: STENT REMOVAL;  Surgeon: Clarene Essex, MD;  [facility]OS[facility];  Service: Endoscopy;;   SVT ABLATION N/A 11/26/2016   Procedure:  SVT Ablation;  Surgeon: Will Meredith Leeds, MD;  Location: [place] [facility];  Service: Cardiovascular;  Laterality: N/A;   TEE WITHOUT CARDIOVERSION N/A 08/25/2018   Procedure: TRANSESOPHAGEAL ECHOCARDIOGRAM (TEE);  Surgeon: Dorothy Spark, MD;  Location: [place];  Service: Cardiovascular;  Laterality: N/A;   TEE WITHOUT CARDIOVERSION N/A 04/02/2019   Procedure: TRANSESOPHAGEAL ECHOCARDIOGRAM (TEE);  Surgeon: Josue Hector, MD;  Location: [facility] ENDOSCOPY;  Service: Cardiovascular;  Laterality: N/A;   TONSILLECTOMY     TOTAL KNEE ARTHROPLASTY Right 08/02/2015   Procedure: TOTAL KNEE ARTHROPLASTY;  Surgeon: Renette Butters, MD;  Location: [place];  Service: Orthopedics;  Laterality: Right;   VENOGRAM N/A 01/26/2013   Procedure: VENOGRAM;  Surgeon: Angelia Mould, MD;  Location: [facility] CATH LAB;  Service: Cardiovascular;  Laterality: N/A;    Social History:  reports that he quit smoking about 13 years ago. His smoking use included cigarettes. He has a 25.00 pack-year smoking history. He quit smokeless tobacco use about 13 years ago. He reports that he does not drink alcohol or use drugs.   Allergies  Allergen Reactions   Statins Other (See Comments)    Weak muscles   Ciprofloxacin Rash    Family History  Problem Relation Age of Onset   Heart disease Sister    Thyroid nodules Sister    Heart disease Father    Diabetes Father    Kidney failure Father    Hypertension Father    Healthy Child    Healthy Child    Healthy Child    Cancer Neg Hx       Prior to Admission medications   Medication Sig Start Date End Date Taking? Authorizing Provider  acetaminophen (TYLENOL) 325 MG tablet Take 325 mg by mouth every 6 (six) hours as needed for mild pain or headache.     [provider]  Alirocumab (PRALUENT) 150 MG/ML SOAJ Inject 1 pen into the skin every 14 (fourteen) days. 04/08/19   Lorretta Harp, MD  allopurinol (ZYLOPRIM) 100 MG tablet Take 1 tablet by mouth once daily 06/12/19   Biagio Borg, MD  aspirin EC 325 MG tablet Take 1 tablet (325 mg total) by mouth daily. 10/10/18   Hongalgi, Lenis Dickinson, MD  betamethasone dipropionate (DIPROLENE) 0.05 % cream Apply 1 application topically daily as needed (leg sores).    [provider]  citalopram (CELEXA) 20 MG tablet Take 20 mg by mouth at bedtime.    [provider]  colestipol (COLESTID) 1 g tablet Take 3 tablets (3 g total) by mouth daily. 02/18/19   Renato Shin, MD  doxercalciferol ([name]) 4 MCG/2ML injection Inject 1 mL (2 mcg total) into the vein Every Tuesday,Thursday,and Saturday with dialysis. 09/02/18    Kayleen Memos, DO  ezetimibe (ZETIA) 10 MG tablet Take 10 mg by mouth every evening.    [provider]  ferric citrate (AURYXIA) 1 GM 210 MG(Fe) tablet Take 630 mg by mouth 3 (three) times daily with meals.    [provider]  gabapentin (NEURONTIN) 300 MG capsule TAKE 1 TO 2 CAPSULES BY MOUTH AT BEDTIME AS NEEDED FOR PAIN AND FOR SLEEP 06/25/19   Biagio Borg, MD  hydroxypropyl methylcellulose / hypromellose (ISOPTO TEARS / GONIOVISC) 2.5 % ophthalmic solution Place 1 drop into both eyes 3 (three) times daily as needed for dry eyes. 05/03/17   Biagio Borg, MD  loperamide (IMODIUM) 2 MG capsule Take 2 mg by mouth 4 (four) times  daily as needed for diarrhea or loose stools.    [provider]  methimazole (TAPAZOLE) 5 MG tablet Take 1/2 (one-half) tablet by mouth once daily 07/13/19   Renato Shin, MD  midodrine (PROAMATINE) 10 MG tablet Take 10mg  every Tuesday, Thursday and Saturday    [provider]  multivitamin (RENA-VIT) TABS tablet Take 1 tablet by mouth daily.     [provider]  pantoprazole (PROTONIX) 40 MG tablet Take 40 mg by mouth at bedtime.  11/03/18   [provider]  polyethylene glycol (MIRALAX / GLYCOLAX) 17 g packet Take 17 g by mouth daily as needed for mild constipation. 04/02/19   Aline August, MD  SENSIPAR 90 MG tablet Take 90 mg by mouth 2 (two) times daily. 09/18/16   [provider]  traMADol (ULTRAM) 50 MG tablet Take 50 mg by mouth every 6 (six) hours as needed.    [provider]  zolpidem (AMBIEN) 5 MG tablet Take 1 tablet (5 mg total) by mouth at bedtime as needed. for sleep 07/03/19   Biagio Borg, MD    Physical Exam: BP 93/82    Pulse 91    Temp (!) 100.4 F (38 C) (Oral)    Resp 12    Ht 6\' 1"  (1.854 m)    Wt 113.4 kg    SpO2 97%    BMI 32.98 kg/m   General: NAD, chronically ill-appearing, alert, oriented, follows command Eyes: Normal ENT: Normal Neck: Supple Cardiovascular: S1, S2  present Respiratory: Diminished breath sounds bilaterally Abdomen: Soft, tenderness RU&RLQ, nondistended, bowel sounds present Skin: Normal Musculoskeletal: No bilateral pedal edema noted Psychiatric: Normal mood Neurologic: No obvious focal neurologic deficits noted          Labs on Admission:  Basic Metabolic Panel: Recent Labs  Lab 07/31/19 1213 07/31/19 1251  NA 138 136  K 5.7* 6.8*  CL 95* 107  CO2 25  --   GLUCOSE 148* 123*  BUN 41* 53*  CREATININE 8.24* 7.10*  CALCIUM 8.9  --    Liver Function Tests: Recent Labs  Lab 07/31/19 1213  AST 40  ALT 63*  ALKPHOS 147*  BILITOT 1.1  PROT 7.3  ALBUMIN 3.2*   Recent Labs  Lab 07/31/19 1213  LIPASE 33   No results for input(s): AMMONIA in the last 168 hours. CBC: Recent Labs  Lab 07/31/19 1213 07/31/19 1251  WBC 10.0  --   NEUTROABS 8.1*  --   HGB 13.5 10.2*  HCT 44.2 30.0*  MCV 98.9  --   PLT 209  --    Cardiac Enzymes: No results for input(s): CKTOTAL, CKMB, CKMBINDEX, TROPONINI in the last 168 hours.  BNP (last 3 results) No results for input(s): BNP in the last 8760 hours.  ProBNP (last 3 results) No results for input(s): PROBNP in the last 8760 hours.  CBG: Recent Labs  Lab 07/31/19 1050  GLUCAP 130*    Radiological Exams on Admission: Ct Head Wo Contrast  Result Date: 07/31/2019 CLINICAL DATA:  Encephalopathy EXAM: CT HEAD WITHOUT CONTRAST TECHNIQUE: Contiguous axial images were obtained from the base of the skull through the vertex without intravenous contrast. COMPARISON:  10/04/2018 FINDINGS: Brain: Stable atrophy and chronic white matter microvascular ischemic changes throughout both cerebral hemispheres. No acute intracranial hemorrhage, new mass lesion, definite infarction, midline shift, herniation, hydrocephalus, or extra-axial fluid collection. No focal mass effect or edema. Cisterns are patent. Minor cerebellar atrophy. Vascular: No hyperdense vessel or unexpected calcification.  Skull:  Normal. Negative for fracture or focal lesion. Sinuses/Orbits: No acute finding. Other: None. IMPRESSION: Stable atrophy and chronic white matter microvascular ischemic changes. No interval change or acute process by noncontrast CT. Electronically Signed   By: Jerilynn Mages.  Shick M.D.   On: 07/31/2019 15:27   Dg Chest Portable 1 View  Result Date: 07/31/2019 CLINICAL DATA:  Chest pain, fever, dialysis patient. EXAM: PORTABLE CHEST 1 VIEW COMPARISON:  07/03/2019 FINDINGS: Borderline enlarged cardiac silhouette. The aorta remains tortuous and calcified. Stable linear scarring in the left mid lung zone. Mildly prominent interstitial markings with improvement. Cervical spine fixation hardware. Right shoulder degenerative changes. IMPRESSION: 1. No acute abnormality. 2. Stable borderline cardiomegaly. 3. Mild chronic interstitial lung disease. Electronically Signed   By: Claudie Revering M.D.   On: 07/31/2019 11:49   Ct Renal Stone Study  Result Date: 07/31/2019 CLINICAL DATA:  Abdominal pain. EXAM: CT ABDOMEN AND PELVIS WITHOUT CONTRAST TECHNIQUE: Multidetector CT imaging of the abdomen and pelvis was performed following the standard protocol without IV contrast. COMPARISON:  11/11/2018 FINDINGS: Lower chest: Minimal bilateral dependent atelectasis or scarring, without significant change. Stable 6 mm partially calcified subpleural nodule in the lateral aspect of the right upper lobe, anteriorly, on image number 7 series 5. Borderline enlarged heart. Atheromatous coronary artery calcifications. Hepatobiliary: Intrahepatic biliary air is again demonstrated with stable mild dilatation of the intrahepatic ducts. A biliary stent extending from the proximal common duct into the duodenum is currently demonstrated without the previously demonstrated extrahepatic biliary ductal dilatation. Cholecystectomy clips. Pancreas: Unremarkable. No pancreatic ductal dilatation or surrounding inflammatory changes. Spleen: Normal in size  without focal abnormality. Adrenals/Urinary Tract: Normal appearing adrenal glands. The kidneys remains small, containing multiple cysts and vascular calcifications. No significant urine in the urinary bladder. No visible ureteral abnormalities. Stomach/Bowel: Stomach is within normal limits. Appendix appears normal. No evidence of bowel wall thickening, distention, or inflammatory changes. Vascular/Lymphatic: Atheromatous arterial calcifications without aneurysm. No enlarged lymph nodes. Reproductive: Prostate is unremarkable. Other: No abdominal wall hernia or abnormality. No abdominopelvic ascites. Musculoskeletal: Ray cages at the L3-4 level and solid interbody bone and pedicle screw and rod fusion at the L4-5 level. Lumbar and lower thoracic spine degenerative changes. IMPRESSION: No acute abnormality. Electronically Signed   By: Claudie Revering M.D.   On: 07/31/2019 15:35    EKG: Independently reviewed.  Sinus tachycardia, PVCs, no acute ST changes  Assessment/Plan Present on Admission:  Sepsis ([place])  (Resolved) Chronic systolic heart failure ([name])  Essential hypertension  Dementia ([name])  Gout  Principal Problem:   Sepsis ([place]) Active Problems:   Gout   Dementia ([name])   ESRD on hemodialysis ([place])   Essential hypertension  Sepsis Febrile, tachycardic, hypotensive, elevated lactic acid on presentation Unknown etiology, ??  Abdominal source, high risk for cholangitis given history of multiple gallstones with multiple ERCP done LA 2, trended down after 500cc bolus BC x2 pending COVID-19 negative Chest x-ray unremarkable Patient does not make urine CT renal stone unremarkable, may consider CT abdomen/pelvis without contrast if abdominal pain persists Continue broad-spectrum antibiotics, pending culture report Caution with IV fluids due to ESRD Continue to monitor closely  Acute metabolic encephalopathy Improving, pt requesting to eat, follows command, AAO CT head  negative Likely due to above Management as above  History of multiple gallstones s/p stent placement History as above LFTs, except ALT(63), lipase within normal limits, will trend As per GI Dr. Watt Climes, plan to repeat ERCP in 1 to 2 months, last done was in 05/20/2019  Consult to [facility] GI, Dr Oletta Lamas May need ?MRCP, although no significant transaminitis  Monitor closely  ESRD Nephrology consulted by EDP Further management by nephrology  Hyperkalemia Repeat K, reolved Status post calcium gluconate, bicarb Frequent BMP  Chronic systolic/diastolic HF Appears euvolemic TEE done on 7/20 showed EF of 60 to 65% Volume status managed with HD No longer on ACE/ARB's, BB due to low BP with ESRD  CAD status post stent to RCA in 2008, recurrent NSTEMI Chest pain free Continue aspirin No longer on Plavix due to rectal bleeding from possible radiation proctitis Continue Zetia, [employer]-9  Diabetes mellitus type 2 Last A1c on 05/2019 was 6 Diet-controlled SSI, Accu-Cheks, hypoglycemic protocol  Hyperthyroidism TSH, free T4 pending Continue methimazole  COPD On O2 at night  OSA on CPAP CPAP ordered QHS  Gout Continue allopurinol  History of prostate cancer Status post radiation  Deconditioning/fatigue Likely multifactorial PT/OT Consider palliative consult  Obesity Lifestyle modification advised      DVT prophylaxis: Heparin  Code Status: Full  Family Communication: None at bedside  Disposition Plan: To be determined  Consults called: [facility]   Admission status: Inpatient    Alma Friendly MD [facility]  If 7PM-7AM, please contact night-coverage [email or web address]   07/31/2019, 4:54 PM

## 2019-07-31 NOTE — Progress Notes (Addendum)
Patient was transferred to the bed, vitals where taken and cardiac monitoring was place. Report given to night shift nurse,  patient is stable, without any complaints of pain.

## 2019-07-31 NOTE — ED Notes (Signed)
ED TO INPATIENT HANDOFF REPORT  ED Nurse Name and Phone #: Celene Squibb RN  S Name/Age/Gender [name] 68 y.o. male Room/Bed: 022C/022C  Code Status   Code Status: Prior  Home/SNF/Other Home Patient oriented to: self, place, time and situation Is this baseline? Yes   Triage Complete: Triage complete  Chief Complaint Weakness,Altered  Triage Note Wife called EMS with c/o generalized weakness after dialysis yesterday.   RUQ abd pain extending into R flank, hx of gall stones and also noted pain on urination.  Alert and oriented x 2, difficult in answering questions.    Allergies Allergies  Allergen Reactions  . Statins Other (See Comments)    Weak muscles  . Ciprofloxacin Rash    Level of Care/Admitting Diagnosis ED Disposition    ED Disposition Condition [facility]: [place] [[id number]]  Level of Care: Telemetry Medical [104]  Covid Evaluation: Confirmed COVID Negative  Diagnosis: Sepsis [facility]) [[id number]]  Admitting Physician: Alma Friendly [[id number]]  Attending Physician: Alma Friendly [[id number]]  Estimated length of stay: past midnight tomorrow  Certification:: I certify this patient will need inpatient services for at least 2 midnights  PT Class (Do Not Modify): Inpatient [101]  PT Acc Code (Do Not Modify): Private [1]       B Medical/Surgery History Past Medical History:  Diagnosis Date  . Acute encephalopathy 10/04/2018  . Allergic rhinitis, cause unspecified 02/24/2014  . Anemia 06/16/2011  . BENIGN PROSTATIC HYPERTROPHY 10/14/2009  . CAD, NATIVE VESSEL 02/06/2009   a. 06/2007 s/p Taxus DES to the RCA;  b. 08/2016 NSTEMI in setting of SVT/PCI: LM 30ost, LAD 58m (3.0x16 Synergy DES), LCX 6m, OM1 60, RI 40, RCA 70p/m, 92m - not amenable to PCI.  [name] Cervical radiculopathy, chronic 02/23/2016   Right c5-6 by NCS/EMG  . Chest pain 08/09/2015  . Chronic combined systolic (congestive) and diastolic (congestive)  heart failure ([place])    a. 10/2016 Echo: EF 40-45%, Gr2 DD. mildly dil LA.  [name] COLONIC POLYPS, HX OF 10/14/2009  . Dementia ([place]) [id number]  . Depression 09/24/2015  . DIABETES MELLITUS, TYPE II 02/01/2010  . DYSLIPIDEMIA 06/18/2007  . ESRD (end stage renal disease) on dialysis ([place]) 08/04/2010   ESRD due to DM adn HTN, started HD in 2011. As of 2019 has a R arm graft and gets HD on TTS sched  . FOOT PAIN 08/12/2008  . GAIT DISTURBANCE 03/03/2010  . GASTROENTERITIS, VIRAL 10/14/2009  . GERD 06/18/2007  . GOITER, MULTINODULAR 12/26/2007  . GOUT 06/18/2007  . GYNECOMASTIA 07/17/2010  . Hemodialysis access, fistula mature [facility])    Dialysis T-Th-Sa ([place]) Right upper arm fistula  . Hyperlipidemia 10/16/2011  . Hyperparathyroidism, secondary ([place]) 06/16/2011  . HYPERTENSION 06/18/2007  . Hyperthyroidism   . Hypocalcemia 06/07/2010  . Ischemic cardiomyopathy    a. 10/2016 Echo: EF 40-45%.  . Lumbar stenosis with neurogenic claudication   . ONYCHOMYCOSIS, TOENAILS 12/26/2007  . OSA on CPAP 10/16/2011  . Other malaise and fatigue 11/24/2009  . PERIPHERAL NEUROPATHY 06/18/2007  . Prostate cancer ([place])   . PSVT (paroxysmal supraventricular tachycardia) ([place])    a. 67/3419 complicated by NSTEMI;  b. 11/2016 Treated w/ adenosine in ED;  c. 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  [name] PULMONARY NODULE, RIGHT LOWER LOBE 06/08/2009  . Sleep apnea    cpap machine and o2  . TRANSAMINASES, SERUM, ELEVATED 02/01/2010  . Transfusion history    none recent  .  Unspecified hypotension 01/30/2010   Past Surgical History:  Procedure Laterality Date  . A/V SHUNTOGRAM N/A 04/07/2018   Procedure: A/V SHUNTOGRAM;  Surgeon: Waynetta Sandy, MD;  Location: [place] CV LAB;  Service: Cardiovascular;  Laterality: N/A;  . ARTERIOVENOUS GRAFT PLACEMENT Right 2009   forearm/notes 02/01/2011  . AV FISTULA PLACEMENT  11/07/2011   Procedure: INSERTION OF ARTERIOVENOUS (AV) GORE-TEX GRAFT ARM;  Surgeon: Tinnie Gens, MD;   Location: [place];  Service: Vascular;  Laterality: Left;  . BACK SURGERY  1998  . BALLOON DILATION N/A 11/14/2018   Procedure: BALLOON DILATION;  Surgeon: Ronnette Juniper, MD;  Location: [place];  Service: Gastroenterology;  Laterality: N/A;  . BASCILIC VEIN TRANSPOSITION Right 02/27/2013   Procedure: BASCILIC VEIN TRANSPOSITION;  Surgeon: Mal Misty, MD;  Location: [place];  Service: Vascular;  Laterality: Right;  Right Basilic Vein Transposition   . BILIARY DILATION  03/31/2019   Procedure: BILIARY DILATION;  Surgeon: Clarene Essex, MD;  Location: [facility] ENDOSCOPY;  Service: Endoscopy;;  . BILIARY DILATION  05/20/2019   Procedure: BILIARY DILATION;  Surgeon: Clarene Essex, MD;  [facility] ENDOSCOPY;  Service: Endoscopy;;  . BILIARY STENT PLACEMENT  03/31/2019   Procedure: BILIARY STENT PLACEMENT;  Surgeon: Clarene Essex, MD;  Location: [facility] ENDOSCOPY;  Service: Endoscopy;;  . BILIARY STENT PLACEMENT N/A 05/20/2019   Procedure: BILIARY STENT PLACEMENT;  Surgeon: Clarene Essex, MD;  [facility] ENDOSCOPY;  Service: Endoscopy;  Laterality: N/A;  . CARDIAC CATHETERIZATION N/A 08/06/2016   Procedure: Left Heart Cath and Coronary Angiography;  Surgeon: Jolaine Artist, MD;  Location: [place] CV LAB;  Service: Cardiovascular;  Laterality: N/A;  . CARDIAC CATHETERIZATION N/A 08/07/2016   Procedure: Coronary/Graft Atherectomy-CSI LAD;  Surgeon: Peter M Martinique, MD;  Location: [place] CV LAB;  Service: Cardiovascular;  Laterality: N/A;  . CERVICAL SPINE SURGERY  2/09   "to repair nerve problems in my left arm"  . CHOLECYSTECTOMY    . COLONOSCOPY WITH PROPOFOL N/A 04/26/2017   Procedure: COLONOSCOPY WITH PROPOFOL;  Surgeon: Otis Brace, MD;  Location: [place];  Service: Gastroenterology;  Laterality: N/A;  . CORONARY ANGIOPLASTY WITH STENT PLACEMENT  06/11/2008  . CORONARY ANGIOPLASTY WITH STENT PLACEMENT  06/2007   TAXUS stent to RCA/notes 01/31/2011  . ENDOSCOPIC RETROGRADE CHOLANGIOPANCREATOGRAPHY  (ERCP) WITH PROPOFOL N/A 03/31/2019   Procedure: ENDOSCOPIC RETROGRADE CHOLANGIOPANCREATOGRAPHY (ERCP) WITH PROPOFOL;  Surgeon: Clarene Essex, MD;  Location: [place];  Service: Endoscopy;  Laterality: N/A;  . ERCP N/A 11/14/2018   Procedure: ENDOSCOPIC RETROGRADE CHOLANGIOPANCREATOGRAPHY (ERCP);  Surgeon: Ronnette Juniper, MD;  Location: [place];  Service: Gastroenterology;  Laterality: N/A;  . ERCP N/A 05/20/2019   Procedure: ENDOSCOPIC RETROGRADE CHOLANGIOPANCREATOGRAPHY (ERCP);  Surgeon: Clarene Essex, MD;  [facility];  Service: Endoscopy;  Laterality: N/A;  . ESOPHAGOGASTRODUODENOSCOPY  09/28/2011   Procedure: ESOPHAGOGASTRODUODENOSCOPY (EGD);  Surgeon: Missy Sabins, MD;  Location: [facility] ENDOSCOPY;  Service: Endoscopy;  Laterality: N/A;  . ESOPHAGOGASTRODUODENOSCOPY N/A 04/07/2015   Procedure: ESOPHAGOGASTRODUODENOSCOPY (EGD);  Surgeon: Teena Irani, MD;  Location: [facility];  Service: Endoscopy;  Laterality: N/A;  . ESOPHAGOGASTRODUODENOSCOPY N/A 04/19/2015   Procedure: ESOPHAGOGASTRODUODENOSCOPY (EGD);  Surgeon: Arta Silence, MD;  Location: [facility] ENDOSCOPY;  Service: Endoscopy;  Laterality: N/A;  . ESOPHAGOGASTRODUODENOSCOPY  11/14/2018   Procedure: ESOPHAGOGASTRODUODENOSCOPY (EGD);  Surgeon: Ronnette Juniper, MD;  Location: [facility] ENDOSCOPY;  Service: Gastroenterology;;  . ESOPHAGOGASTRODUODENOSCOPY (EGD) WITH PROPOFOL N/A 06/13/2018   Procedure: ESOPHAGOGASTRODUODENOSCOPY (EGD) WITH PROPOFOL;  Surgeon: Wonda Horner, MD;  Location: [facility]  ENDOSCOPY;  Service: Endoscopy;  Laterality: N/A;  . FLEXIBLE SIGMOIDOSCOPY N/A 05/21/2017   Procedure: Conni Elliot;  Surgeon: Clarene Essex, MD;  Location: [place];  Service: Endoscopy;  Laterality: N/A;  . FLEXIBLE SIGMOIDOSCOPY Left 07/02/2017   Procedure: FLEXIBLE SIGMOIDOSCOPY;  Surgeon: Laurence Spates, MD;  Location: [place];  Service: Endoscopy;  Laterality: Left;  . FOREIGN BODY REMOVAL  09/2003   via upper endoscopy/notes 02/12/2011   . GIVENS CAPSULE STUDY  09/30/2011   Procedure: GIVENS CAPSULE STUDY;  Surgeon: Jeryl Columbia, MD;  Location: [facility] ENDOSCOPY;  Service: Endoscopy;  Laterality: N/A;  . INSERTION OF DIALYSIS CATHETER Right 2014  . INSERTION OF DIALYSIS CATHETER Left 02/11/2013   Procedure: INSERTION OF DIALYSIS CATHETER;  Surgeon: Conrad [place], MD;  Location: [place];  Service: Vascular;  Laterality: Left;  Ultrasound guided  . IR AV DIALY SHUNT INTRO NEEDLE/INTRACATH INITIAL W/PTA/IMG RIGHT Right 11/14/2018  . IR US GUIDE VASC ACCESS RIGHT  11/14/2018  . LUMBAR LAMINECTOMY/DECOMPRESSION MICRODISCECTOMY Bilateral 07/31/2017   Procedure: LAMINECTOMY AND FORAMINOTOMY- BILATERAL LUMBAR TWO- LUMBAR THREE;  Surgeon: Earnie Larsson, MD;  Location: [place];  Service: Neurosurgery;  Laterality: Bilateral;  LAMINECTOMY AND FORAMINOTOMY- BILATERAL LUMBAR 2- LUMBAR 3  . PERIPHERAL VASCULAR BALLOON ANGIOPLASTY  04/07/2018   Procedure: PERIPHERAL VASCULAR BALLOON ANGIOPLASTY;  Surgeon: Waynetta Sandy, MD;  Location: [place] CV LAB;  Service: Cardiovascular;;  RUE AVF  . REMOVAL OF A DIALYSIS CATHETER Right 02/11/2013   Procedure: REMOVAL OF A DIALYSIS CATHETER;  Surgeon: Conrad [place], MD;  Location: [place];  Service: Vascular;  Laterality: Right;  . REMOVAL OF STONES  11/14/2018   Procedure: REMOVAL OF STONES;  Surgeon: Ronnette Juniper, MD;  Location: [facility] ENDOSCOPY;  Service: Gastroenterology;;  . REMOVAL OF STONES  03/31/2019   Procedure: REMOVAL OF STONES;  Surgeon: Clarene Essex, MD;  Location: [place];  Service: Endoscopy;;  . Azzie Almas DILATION N/A 04/07/2015   Procedure: Azzie Almas DILATION;  Surgeon: Teena Irani, MD;  [facility];  Service: Endoscopy;  Laterality: N/A;  . SHUNTOGRAM N/A 09/20/2011   Procedure: Earney Mallet;  Surgeon: Conrad [place], MD;  Location: [facility] CATH LAB;  Service: Cardiovascular;  Laterality: N/A;  . SPHINCTEROTOMY  11/14/2018   Procedure: SPHINCTEROTOMY;  Surgeon: Ronnette Juniper, MD;  Location: [facility]  ENDOSCOPY;  Service: Gastroenterology;;  . Joan Mayans  03/31/2019   Procedure: Joan Mayans;  Surgeon: Clarene Essex, MD;  Location: [facility] ENDOSCOPY;  Service: Endoscopy;;  . SPHINCTEROTOMY  05/20/2019   Procedure: Joan Mayans;  Surgeon: Clarene Essex, MD;  [facility];  Service: Endoscopy;;  . SPYGLASS CHOLANGIOSCOPY N/A 05/20/2019   Procedure: [id number] CHOLANGIOSCOPY;  Surgeon: Clarene Essex, MD;  [facility];  Service: Endoscopy;  Laterality: N/A;  Bess Kinds LITHOTRIPSY N/A 05/20/2019   Procedure: [id number] LITHOTRIPSY;  Surgeon: Clarene Essex, MD;  [facility];  Service: Endoscopy;  Laterality: N/A;  . STENT REMOVAL  05/20/2019   Procedure: STENT REMOVAL;  Surgeon: Clarene Essex, MD;  [facility]OS[facility];  Service: Endoscopy;;  . SVT ABLATION N/A 11/26/2016   Procedure: SVT Ablation;  Surgeon: Will Meredith Leeds, MD;  Location: [place] CV LAB;  Service: Cardiovascular;  Laterality: N/A;  . TEE WITHOUT CARDIOVERSION N/A 08/25/2018   Procedure: TRANSESOPHAGEAL ECHOCARDIOGRAM (TEE);  Surgeon: Dorothy Spark, MD;  Location: [place];  Service: Cardiovascular;  Laterality: N/A;  . TEE WITHOUT CARDIOVERSION N/A 04/02/2019   Procedure: TRANSESOPHAGEAL ECHOCARDIOGRAM (TEE);  Surgeon: Josue Hector, MD;  Location: [place];  Service: Cardiovascular;  Laterality: N/A;  . TONSILLECTOMY    . TOTAL KNEE ARTHROPLASTY Right 08/02/2015   Procedure: TOTAL KNEE ARTHROPLASTY;  Surgeon: Renette Butters, MD;  Location: [place];  Service: Orthopedics;  Laterality: Right;  . VENOGRAM N/A 01/26/2013   Procedure: VENOGRAM;  Surgeon: Angelia Mould, MD;  Location: [facility] CATH LAB;  Service: Cardiovascular;  Laterality: N/A;     A IV Location/Drains/Wounds Patient Lines/Drains/Airways Status   Active Line/Drains/Airways    Name:   Placement date:   Placement time:   Site:   Days:   Peripheral IV 07/31/19 Left Forearm   07/31/19    1242    Forearm   less than 1    Fistula / Graft   08/01/17    1223    -   729   Fistula / Graft Right Upper arm   -    -    Upper arm      Fistula / Graft Right Upper arm Arteriovenous fistula   -    -    Upper arm      GI Stent 10 Fr.   05/20/19    1500    -   72          Intake/Output Last 24 hours  Intake/Output Summary (Last 24 hours) at 07/31/2019 1839 Last data filed at 07/31/2019 1455 Gross per 24 hour  Intake 800 ml  Output -  Net 800 ml    Labs/Imaging Results for orders placed or performed during the hospital encounter of 07/31/19 (from the past 48 hour(s))  CBG monitoring, ED     Status: Abnormal   Collection Time: 07/31/19 10:50 AM  Result Value Ref Range   Glucose-Capillary 130 (H) 70 - 99 mg/dL   Comment 1 Notify RN    Comment 2 Document in Chart   CBC with Differential     Status: Abnormal   Collection Time: 07/31/19 12:13 PM  Result Value Ref Range   WBC 10.0 4.0 - 10.5 K/uL   RBC 4.47 4.22 - 5.81 MIL/uL   Hemoglobin 13.5 13.0 - 17.0 g/dL   HCT 44.2 39.0 - 52.0 %   MCV 98.9 80.0 - 100.0 fL   MCH 30.2 26.0 - 34.0 pg   MCHC 30.5 30.0 - 36.0 g/dL   RDW 16.6 (H) 11.5 - 15.5 %   Platelets 209 150 - 400 K/uL   nRBC 0.0 0.0 - 0.2 %   Neutrophils Relative % 82 %   Neutro Abs 8.1 (H) 1.7 - 7.7 K/uL   Lymphocytes Relative 6 %   Lymphs Abs 0.6 (L) 0.7 - 4.0 K/uL   Monocytes Relative 12 %   Monocytes Absolute 1.2 (H) 0.1 - 1.0 K/uL   Eosinophils Relative 0 %   Eosinophils Absolute 0.0 0.0 - 0.5 K/uL   Basophils Relative 0 %   Basophils Absolute 0.0 0.0 - 0.1 K/uL   Immature Granulocytes 0 %   Abs Immature Granulocytes 0.04 0.00 - 0.07 K/uL    Comment: Performed at [facility], [address] [place], [place] [address]  Comprehensive metabolic panel     Status: Abnormal   Collection Time: 07/31/19 12:13 PM  Result Value Ref Range   Sodium 138 135 - 145 mmol/L   Potassium 5.7 (H) 3.5 - 5.1 mmol/L   Chloride 95 (L) 98 - 111 mmol/L   CO2 25 22 - 32 mmol/L   Glucose, Bld 148 (H) 70  - 99 mg/dL   BUN  41 (H) 8 - 23 mg/dL   Creatinine, Ser 8.24 (H) 0.61 - 1.24 mg/dL   Calcium 8.9 8.9 - 10.3 mg/dL   Total Protein 7.3 6.5 - 8.1 g/dL   Albumin 3.2 (L) 3.5 - 5.0 g/dL   AST 40 15 - 41 U/L   ALT 63 (H) 0 - 44 U/L   Alkaline Phosphatase 147 (H) 38 - 126 U/L   Total Bilirubin 1.1 0.3 - 1.2 mg/dL   GFR calc non Af Amer 6 (L) >60 mL/min   GFR calc Af Amer 7 (L) >60 mL/min   Anion gap 18 (H) 5 - 15    Comment: Performed at [facility], [place] [address] [place], [place] [address]  Troponin I (High Sensitivity)     Status: Abnormal   Collection Time: 07/31/19 12:13 PM  Result Value Ref Range   Troponin I (High Sensitivity) 32 (H) <18 ng/L    Comment: (NOTE) Elevated high sensitivity troponin I (hsTnI) values and significant  changes across serial measurements may suggest ACS but many other  chronic and acute conditions are known to elevate hsTnI results.  Refer to the "Links" section for chest pain algorithms and additional  guidance. Performed at [facility], [place] [address] [place], [place] [address]   Ethanol     Status: None   Collection Time: 07/31/19 12:13 PM  Result Value Ref Range   Alcohol, Ethyl (B) <10 <10 mg/dL    Comment: (NOTE) Lowest detectable limit for serum alcohol is 10 mg/dL. For medical purposes only. Performed at [facility], [place] [address] [place], [place] [address]   Lactic acid, plasma     Status: Abnormal   Collection Time: 07/31/19 12:13 PM  Result Value Ref Range   Lactic Acid, Venous 2.0 (HH) 0.5 - 1.9 mmol/L    Comment: CRITICAL RESULT CALLED TO, READ BACK BY AND VERIFIED WITH: Jerilynn Mages Traeson Dusza RN [phone] [id number] BY A BENNETT Performed at [facility], [place] [address], [place], [place] [address]   APTT     Status: None   Collection Time: 07/31/19 12:13 PM  Result Value Ref Range   aPTT 32 24 - 36 seconds    Comment: Performed at [facility] [address] [place], [place] [address]  Protime-INR     Status:  None   Collection Time: 07/31/19 12:13 PM  Result Value Ref Range   Prothrombin Time 13.6 11.4 - 15.2 seconds   INR 1.1 0.8 - 1.2    Comment: (NOTE) INR goal varies based on device and disease states. Performed at [facility], [place] [address] [place], [place] [address]   Blood Culture (routine x 2)     Status: None (Preliminary result)   Collection Time: 07/31/19 12:13 PM   Specimen: BLOOD  Result Value Ref Range   Specimen Description BLOOD LEFT ANTECUBITAL    Special Requests AEROBIC BOTTLE ONLY Blood Culture adequate volume    Culture      NO GROWTH <12 HOURS Performed at [facility] [address], [place], [place] [address]    Report Status PENDING   Blood Culture (routine x 2)     Status: None (Preliminary result)   Collection Time: 07/31/19 12:13 PM   Specimen: BLOOD  Result Value Ref Range   Specimen Description BLOOD LEFT ANTECUBITAL    Special Requests      BOTTLES DRAWN AEROBIC AND ANAEROBIC Blood Culture adequate volume  Culture      NO GROWTH <12 HOURS Performed at [facility], [place] [address], [place], [place] [address]    Report Status PENDING   Lipase, blood     Status: None   Collection Time: 07/31/19 12:13 PM  Result Value Ref Range   Lipase 33 11 - 51 U/L    Comment: Performed at [facility] [address] [place], [place] [address]  I-stat chem 8, ED (not at [facility] or [facility])     Status: Abnormal   Collection Time: 07/31/19 12:51 PM  Result Value Ref Range   Sodium 136 135 - 145 mmol/L   Potassium 6.8 (HH) 3.5 - 5.1 mmol/L   Chloride 107 98 - 111 mmol/L   BUN 53 (H) 8 - 23 mg/dL   Creatinine, Ser 7.10 (H) 0.61 - 1.24 mg/dL   Glucose, Bld 123 (H) 70 - 99 mg/dL   Calcium, Ion 0.80 (LL) 1.15 - 1.40 mmol/L   TCO2 24 22 - 32 mmol/L   Hemoglobin 10.2 (L) 13.0 - 17.0 g/dL   HCT 30.0 (L) 39.0 - 52.0 %   Comment NOTIFIED PHYSICIAN   SARS Coronavirus 2 by RT PCR (hospital order, performed in [place] hospital lab)  Nasopharyngeal Nasopharyngeal Swab     Status: None   Collection Time: 07/31/19  1:25 PM   Specimen: Nasopharyngeal Swab  Result Value Ref Range   SARS Coronavirus 2 NEGATIVE NEGATIVE    Comment: (NOTE) If result is NEGATIVE SARS-CoV-2 target nucleic acids are NOT DETECTED. The SARS-CoV-2 RNA is generally detectable in upper and lower  respiratory specimens during the acute phase of infection. The lowest  concentration of SARS-CoV-2 viral copies this assay can detect is 250  copies / mL. A negative result does not preclude SARS-CoV-2 infection  and should not be used as the sole basis for treatment or other  patient management decisions.  A negative result may occur with  improper specimen collection / handling, submission of specimen other  than nasopharyngeal swab, presence of viral mutation(s) within the  areas targeted by this assay, and inadequate number of viral copies  (<250 copies / mL). A negative result must be combined with clinical  observations, patient history, and epidemiological information. If result is POSITIVE SARS-CoV-2 target nucleic acids are DETECTED. The SARS-CoV-2 RNA is generally detectable in upper and lower  respiratory specimens dur ing the acute phase of infection.  Positive  results are indicative of active infection with SARS-CoV-2.  Clinical  correlation with patient history and other diagnostic information is  necessary to determine patient infection status.  Positive results do  not rule out bacterial infection or co-infection with other viruses. If result is PRESUMPTIVE POSTIVE SARS-CoV-2 nucleic acids MAY BE PRESENT.   A presumptive positive result was obtained on the submitted specimen  and confirmed on repeat testing.  While 2019 novel coronavirus  (SARS-CoV-2) nucleic acids may be present in the submitted sample  additional confirmatory testing may be necessary for epidemiological  and / or clinical management purposes  to differentiate  between  SARS-CoV-2 and other Sarbecovirus currently known to infect humans.  If clinically indicated additional testing with an alternate test  methodology [id number]) is advised. The SARS-CoV-2 RNA is generally  detectable in upper and lower respiratory sp ecimens during the acute  phase of infection. The expected result is Negative. Fact Sheet for Patients:  StrictlyIdeas.no Fact Sheet for Healthcare Providers: BankingDealers.co.za This test is not yet approved or cleared  by the [place] and has been authorized for detection and/or diagnosis of SARS-CoV-2 by FDA under an Emergency Use Authorization (EUA).  This EUA will remain in effect (meaning this test can be used) for the duration of the COVID-19 declaration under Section 564(b)(1) of the Act, 21 U.S.C. section 360bbb-3(b)(1), unless the authorization is terminated or revoked sooner. Performed at [facility], [place] [address] [place], [place] [address]   Basic metabolic panel     Status: Abnormal   Collection Time: 07/31/19  5:22 PM  Result Value Ref Range   Sodium 139 135 - 145 mmol/L   Potassium 5.7 (H) 3.5 - 5.1 mmol/L   Chloride 98 98 - 111 mmol/L   CO2 25 22 - 32 mmol/L   Glucose, Bld 127 (H) 70 - 99 mg/dL   BUN 46 (H) 8 - 23 mg/dL   Creatinine, Ser 8.48 (H) 0.61 - 1.24 mg/dL   Calcium 8.6 (L) 8.9 - 10.3 mg/dL   GFR calc non Af Amer 6 (L) >60 mL/min   GFR calc Af Amer 7 (L) >60 mL/min   Anion gap 16 (H) 5 - 15    Comment: Performed at [facility] [address], [place], [place] [address]  Lactic acid, plasma     Status: None   Collection Time: 07/31/19  5:22 PM  Result Value Ref Range   Lactic Acid, Venous 1.2 0.5 - 1.9 mmol/L    Comment: Performed at [facility] [address], [place], [place] [address]  TSH     Status: None   Collection Time: 07/31/19  5:22 PM  Result Value Ref Range   TSH 0.564 0.350 - 4.500 uIU/mL    Comment:  Performed by a 3rd Generation assay with a functional sensitivity of <=0.01 uIU/mL. Performed at [facility], [place] [address] [place],  [address]   T4, free     Status: None   Collection Time: 07/31/19  5:22 PM  Result Value Ref Range   Free T4 0.94 0.61 - 1.12 ng/dL    Comment: (NOTE) Biotin ingestion may interfere with free T4 tests. If the results are inconsistent with the TSH level, previous test results, or the clinical presentation, then consider biotin interference. If needed, order repeat testing after stopping biotin. Performed at [facility], [place] [address], [place],  [address]    *Note: Due to a large number of results and/or encounters for the requested time period, some results have not been displayed. A complete set of results can be found in Results Review.   Ct Head Wo Contrast  Result Date: 07/31/2019 CLINICAL DATA:  Encephalopathy EXAM: CT HEAD WITHOUT CONTRAST TECHNIQUE: Contiguous axial images were obtained from the base of the skull through the vertex without intravenous contrast. COMPARISON:  10/04/2018 FINDINGS: Brain: Stable atrophy and chronic white matter microvascular ischemic changes throughout both cerebral hemispheres. No acute intracranial hemorrhage, new mass lesion, definite infarction, midline shift, herniation, hydrocephalus, or extra-axial fluid collection. No focal mass effect or edema. Cisterns are patent. Minor cerebellar atrophy. Vascular: No hyperdense vessel or unexpected calcification. Skull: Normal. Negative for fracture or focal lesion. Sinuses/Orbits: No acute finding. Other: None. IMPRESSION: Stable atrophy and chronic white matter microvascular ischemic changes. No interval change or acute process by noncontrast CT. Electronically Signed   By: Jerilynn Mages.  Shick M.D.   On: 07/31/2019 15:27   Dg Chest Portable 1 View  Result Date: 07/31/2019 CLINICAL DATA:  Chest  pain, fever, dialysis patient. EXAM: PORTABLE CHEST 1 VIEW  COMPARISON:  07/03/2019 FINDINGS: Borderline enlarged cardiac silhouette. The aorta remains tortuous and calcified. Stable linear scarring in the left mid lung zone. Mildly prominent interstitial markings with improvement. Cervical spine fixation hardware. Right shoulder degenerative changes. IMPRESSION: 1. No acute abnormality. 2. Stable borderline cardiomegaly. 3. Mild chronic interstitial lung disease. Electronically Signed   By: Claudie Revering M.D.   On: 07/31/2019 11:49   Ct Renal Stone Study  Result Date: 07/31/2019 CLINICAL DATA:  Abdominal pain. EXAM: CT ABDOMEN AND PELVIS WITHOUT CONTRAST TECHNIQUE: Multidetector CT imaging of the abdomen and pelvis was performed following the standard protocol without IV contrast. COMPARISON:  11/11/2018 FINDINGS: Lower chest: Minimal bilateral dependent atelectasis or scarring, without significant change. Stable 6 mm partially calcified subpleural nodule in the lateral aspect of the right upper lobe, anteriorly, on image number 7 series 5. Borderline enlarged heart. Atheromatous coronary artery calcifications. Hepatobiliary: Intrahepatic biliary air is again demonstrated with stable mild dilatation of the intrahepatic ducts. A biliary stent extending from the proximal common duct into the duodenum is currently demonstrated without the previously demonstrated extrahepatic biliary ductal dilatation. Cholecystectomy clips. Pancreas: Unremarkable. No pancreatic ductal dilatation or surrounding inflammatory changes. Spleen: Normal in size without focal abnormality. Adrenals/Urinary Tract: Normal appearing adrenal glands. The kidneys remains small, containing multiple cysts and vascular calcifications. No significant urine in the urinary bladder. No visible ureteral abnormalities. Stomach/Bowel: Stomach is within normal limits. Appendix appears normal. No evidence of bowel wall thickening, distention, or inflammatory changes. Vascular/Lymphatic: Atheromatous arterial  calcifications without aneurysm. No enlarged lymph nodes. Reproductive: Prostate is unremarkable. Other: No abdominal wall hernia or abnormality. No abdominopelvic ascites. Musculoskeletal: Ray cages at the L3-4 level and solid interbody bone and pedicle screw and rod fusion at the L4-5 level. Lumbar and lower thoracic spine degenerative changes. IMPRESSION: No acute abnormality. Electronically Signed   By: Claudie Revering M.D.   On: 07/31/2019 15:35    Pending Labs [employer] (From admission, onward)    Start     Ordered   Signed and Held  HIV Antibody (routine testing w rflx)  (HIV Antibody (Routine testing w reflex) panel)  Once,   R     Signed and Held   Signed and Held  Comprehensive metabolic panel  Tomorrow morning,   R     Signed and Held   Signed and Held  CBC  Tomorrow morning,   R     Signed and Held          Vitals/Pain Today's Vitals   07/31/19 1230 07/31/19 1245 07/31/19 1330 07/31/19 1400  BP: 91/61 (!) 87/57 (!) 81/60 93/82  Pulse:   97 91  Resp: 19 12    Temp:      TempSrc:      SpO2:   95% 97%  Weight:      Height:      PainSc:        Isolation Precautions No active isolations  Medications Medications  ceFEPIme (MAXIPIME) 1 g in sodium chloride 0.9 % 100 mL IVPB (0 g Intravenous Stopped 07/31/19 1401)  acetaminophen (TYLENOL) tablet 650 mg (has no administration in time range)  allopurinol (ZYLOPRIM) tablet 100 mg (has no administration in time range)  aspirin EC tablet 325 mg (has no administration in time range)  ezetimibe (ZETIA) tablet 10 mg (has no administration in time range)  ferric citrate (AURYXIA) tablet 630 mg (has no administration in time range)  hydroxypropyl methylcellulose /  hypromellose (ISOPTO TEARS / GONIOVISC) 2.5 % ophthalmic solution 1 drop (has no administration in time range)  loperamide (IMODIUM) capsule 2 mg (has no administration in time range)  methimazole (TAPAZOLE) tablet 2.5 mg (has no administration in time range)   multivitamin (RENA-VIT) tablet 1 tablet (has no administration in time range)  pantoprazole (PROTONIX) EC tablet 40 mg (has no administration in time range)  polyethylene glycol (MIRALAX / GLYCOLAX) packet 17 g (has no administration in time range)  insulin aspart (novoLOG) injection 0-9 Units (has no administration in time range)  metroNIDAZOLE (FLAGYL) IVPB 500 mg (has no administration in time range)  ceFEPIme (MAXIPIME) 1 g in sodium chloride 0.9 % 100 mL IVPB (has no administration in time range)  vancomycin variable dose per unstable renal function (pharmacist dosing) (has no administration in time range)  vancomycin (VANCOCIN) 2,500 mg in sodium chloride 0.9 % 500 mL IVPB (2,500 mg Intravenous New Bag/Given 07/31/19 1610)  sodium bicarbonate injection 50 mEq (50 mEq Intravenous Given 07/31/19 1346)  calcium gluconate 1 g in sodium chloride 0.9 % 100 mL IVPB (0 g Intravenous Stopped 07/31/19 1455)  sodium zirconium cyclosilicate (LOKELMA) packet 5 g (5 g Oral Given 07/31/19 1407)  sodium chloride 0.9 % bolus 500 mL (0 mLs Intravenous Stopped 07/31/19 1345)  ceFEPIme (MAXIPIME) 1 g in sodium chloride 0.9 % 100 mL IVPB (0 g Intravenous Stopped 07/31/19 1445)    Mobility walks High fall risk   Focused Assessments Cardiac Assessment Handoff:    Lab Results  Component Value Date   CKTOTAL 347 10/03/2018   CKMB 3.0 09/28/2011   TROPONINI 0.04 ([place]) 10/04/2018   Lab Results  Component Value Date   DDIMER 4.52 (H) 10/04/2018   Does the Patient currently have chest pain? No     R Recommendations: See Admitting Provider Note  Report given to:   Additional Notes:

## 2019-08-01 DIAGNOSIS — A021 Salmonella sepsis: Secondary | ICD-10-CM

## 2019-08-01 DIAGNOSIS — G9341 Metabolic encephalopathy: Secondary | ICD-10-CM

## 2019-08-01 DIAGNOSIS — D65 Disseminated intravascular coagulation [defibrination syndrome]: Secondary | ICD-10-CM

## 2019-08-01 LAB — CBC
HCT: 32.1 % — ABNORMAL LOW (ref 39.0–52.0)
Hemoglobin: 10.1 g/dL — ABNORMAL LOW (ref 13.0–17.0)
MCH: 30.5 pg (ref 26.0–34.0)
MCHC: 31.5 g/dL (ref 30.0–36.0)
MCV: 97 fL (ref 80.0–100.0)
Platelets: 216 10*3/uL (ref 150–400)
RBC: 3.31 MIL/uL — ABNORMAL LOW (ref 4.22–5.81)
RDW: 16.6 % — ABNORMAL HIGH (ref 11.5–15.5)
WBC: 8.9 10*3/uL (ref 4.0–10.5)
nRBC: 0 % (ref 0.0–0.2)

## 2019-08-01 LAB — GLUCOSE, CAPILLARY
Glucose-Capillary: 115 mg/dL — ABNORMAL HIGH (ref 70–99)
Glucose-Capillary: 116 mg/dL — ABNORMAL HIGH (ref 70–99)
Glucose-Capillary: 96 mg/dL (ref 70–99)
Glucose-Capillary: 92 mg/dL (ref 70–99)
Glucose-Capillary: 81 mg/dL (ref 70–99)
Glucose-Capillary: 116 mg/dL — ABNORMAL HIGH (ref 70–99)

## 2019-08-01 LAB — COMPREHENSIVE METABOLIC PANEL
ALT: 48 U/L — ABNORMAL HIGH (ref 0–44)
AST: 25 U/L (ref 15–41)
Albumin: 2.9 g/dL — ABNORMAL LOW (ref 3.5–5.0)
Alkaline Phosphatase: 132 U/L — ABNORMAL HIGH (ref 38–126)
Anion gap: 14 (ref 5–15)
BUN: 51 mg/dL — ABNORMAL HIGH (ref 8–23)
CO2: 25 mmol/L (ref 22–32)
Calcium: 8.7 mg/dL — ABNORMAL LOW (ref 8.9–10.3)
Chloride: 101 mmol/L (ref 98–111)
Creatinine, Ser: 9.37 mg/dL — ABNORMAL HIGH (ref 0.61–1.24)
GFR calc Af Amer: 6 mL/min — ABNORMAL LOW (ref >60)
GFR calc non Af Amer: 5 mL/min — ABNORMAL LOW (ref >60)
Glucose, Bld: 130 mg/dL — ABNORMAL HIGH (ref 70–99)
Potassium: 5.4 mmol/L — ABNORMAL HIGH (ref 3.5–5.1)
Sodium: 140 mmol/L (ref 135–145)
Total Bilirubin: 0.9 mg/dL (ref 0.3–1.2)
Total Protein: 6.8 g/dL (ref 6.5–8.1)

## 2019-08-01 MED ORDER — DOXERCALCIFEROL 4 MCG/2ML IV SOLN
INTRAVENOUS | Status: AC
  Filled 2019-08-01: qty 2

## 2019-08-01 MED ORDER — DARBEPOETIN ALFA 40 MCG/0.4ML IJ SOSY
40.0000 ug | PREFILLED_SYRINGE | INTRAMUSCULAR | Status: DC
  Administered 2019-08-01: 40 ug via INTRAVENOUS
  Filled 2019-08-01: qty 0.4

## 2019-08-01 MED ORDER — GABAPENTIN 100 MG PO CAPS
200.0000 mg | ORAL_CAPSULE | Freq: Once | ORAL | Status: AC
  Administered 2019-08-02: 200 mg via ORAL
  Filled 2019-08-01 (×2): qty 2

## 2019-08-01 MED ORDER — CINACALCET HCL 30 MG PO TABS
90.0000 mg | ORAL_TABLET | Freq: Every day | ORAL | Status: DC
  Administered 2019-08-01 – 2019-08-04 (×4): 90 mg via ORAL
  Filled 2019-08-01 (×5): qty 3

## 2019-08-01 MED ORDER — DOXERCALCIFEROL 4 MCG/2ML IV SOLN
3.0000 ug | INTRAVENOUS | Status: DC
  Administered 2019-08-01 – 2019-08-04 (×2): 3 ug via INTRAVENOUS
  Filled 2019-08-01 (×2): qty 2

## 2019-08-01 MED ORDER — MIDODRINE HCL 5 MG PO TABS
10.0000 mg | ORAL_TABLET | ORAL | Status: DC
  Administered 2019-08-01 – 2019-08-04 (×2): 10 mg via ORAL

## 2019-08-01 MED ORDER — MIDODRINE HCL 5 MG PO TABS
ORAL_TABLET | ORAL | Status: AC
  Filled 2019-08-01: qty 2

## 2019-08-01 MED ORDER — DARBEPOETIN ALFA 40 MCG/0.4ML IJ SOSY
PREFILLED_SYRINGE | INTRAMUSCULAR | Status: AC
  Filled 2019-08-01: qty 0.4

## 2019-08-01 MED ORDER — DIPHENHYDRAMINE HCL 25 MG PO CAPS
25.0000 mg | ORAL_CAPSULE | Freq: Every evening | ORAL | Status: DC | PRN
  Filled 2019-08-01 (×2): qty 1

## 2019-08-01 MED ORDER — CHLORHEXIDINE GLUCONATE CLOTH 2 % EX PADS
6.0000 | MEDICATED_PAD | Freq: Every day | CUTANEOUS | Status: DC
  Administered 2019-08-01: 6 via TOPICAL

## 2019-08-01 NOTE — Consult Note (Signed)
[facility] CONSULT Reason for consult: Sepsis Referring Physician: [facility] hospitalist.  PCP: Renal team.  Primary GI: Dr. Simona Huh is an 68 y.o. male.  HPI: He has ESRD on chronic HD.  He was admitted approximately 3 months ago with gram-negative sepsis.  He had cholecystectomy in 2011 and MRCP showed CBD stones.  6/20 underwent ERCP and sphincterotomy with stone removal by Dr. Therisa Doyne.  A large amount of sludge and stones were removed.  It was not clear there were still any stones left.  3 months ago the patient came back.  With the gram-negative sepsis was seen by Dr. Watt Climes and the ERCP was repeated with more stones found in the CBD.  Sphincterotomy was extended.  Plastic stent was left in the CBD.  According to his note, the plan was to remove the stent and repeat ERCP in approximately 1 to 2 months. Patient reports that he had been doing well until about a week ago when he began to have intermittent upper abdominal pain just like before that has gotten to be more steady and more severe.  He has been feeling weak and confused no chest pain trouble breathing etc.  He was seen in the ER and has liver test looked okay had no clear leukocytosis but had a fever of 100.4.  CT with renal stone protocol showed no clear blockage of the renal system and showed the biliary stent to be in what appeared to be good position.  His lactic acid level was elevated.  He was given fluids and started on antibiotics and feels much better.  Blood cultures are pending.   Past Medical History:  Diagnosis Date  . Acute encephalopathy 10/04/2018  . Allergic rhinitis, cause unspecified 02/24/2014  . Anemia 06/16/2011  . BENIGN PROSTATIC HYPERTROPHY 10/14/2009  . CAD, NATIVE VESSEL 02/06/2009   a. 06/2007 s/p Taxus DES to the RCA;  b. 08/2016 NSTEMI in setting of SVT/PCI: LM 30ost, LAD 60m (3.0x16 Synergy DES), LCX 69m, OM1 60, RI 40, RCA 70p/m, 49m - not amenable to PCI.  [name] Cervical radiculopathy, chronic  02/23/2016   Right c5-6 by NCS/EMG  . Chest pain 08/09/2015  . Chronic combined systolic (congestive) and diastolic (congestive) heart failure ([place])    a. 10/2016 Echo: EF 40-45%, Gr2 DD. mildly dil LA.  [name] COLONIC POLYPS, HX OF 10/14/2009  . Dementia ([place]) [phone]  . Depression 09/24/2015  . DIABETES MELLITUS, TYPE II 02/01/2010  . DYSLIPIDEMIA 06/18/2007  . ESRD (end stage renal disease) on dialysis ([place]) 08/04/2010   ESRD due to DM adn HTN, started HD in 2011. As of 2019 has a R arm graft and gets HD on TTS sched  . FOOT PAIN 08/12/2008  . GAIT DISTURBANCE 03/03/2010  . GASTROENTERITIS, VIRAL 10/14/2009  . GERD 06/18/2007  . GOITER, MULTINODULAR 12/26/2007  . GOUT 06/18/2007  . GYNECOMASTIA 07/17/2010  . Hemodialysis access, fistula mature [facility])    Dialysis T-Th-Sa ([place]) Right upper arm fistula  . Hyperlipidemia 10/16/2011  . Hyperparathyroidism, secondary ([place]) 06/16/2011  . HYPERTENSION 06/18/2007  . Hyperthyroidism   . Hypocalcemia 06/07/2010  . Ischemic cardiomyopathy    a. 10/2016 Echo: EF 40-45%.  . Lumbar stenosis with neurogenic claudication   . ONYCHOMYCOSIS, TOENAILS 12/26/2007  . OSA on CPAP 10/16/2011  . Other malaise and fatigue 11/24/2009  . PERIPHERAL NEUROPATHY 06/18/2007  . Prostate cancer ([place])   . PSVT (paroxysmal supraventricular tachycardia) ([place])    a. 29/7989 complicated by NSTEMI;  b. 11/2016 Treated w/ adenosine in ED;  c. 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  [name] PULMONARY NODULE, RIGHT LOWER LOBE 06/08/2009  . Sleep apnea    cpap machine and o2  . TRANSAMINASES, SERUM, ELEVATED 02/01/2010  . Transfusion history    none recent  . Unspecified hypotension 01/30/2010    Past Surgical History:  Procedure Laterality Date  . A/V SHUNTOGRAM N/A 04/07/2018   Procedure: A/V SHUNTOGRAM;  Surgeon: Waynetta Sandy, MD;  Location: [place] [facility];  Service: Cardiovascular;  Laterality: N/A;  . ARTERIOVENOUS GRAFT PLACEMENT Right 2009   forearm/notes  02/01/2011  . AV FISTULA PLACEMENT  11/07/2011   Procedure: INSERTION OF ARTERIOVENOUS (AV) GORE-TEX GRAFT ARM;  Surgeon: Tinnie Gens, MD;  Location: [place];  Service: Vascular;  Laterality: Left;  . BACK SURGERY  1998  . BALLOON DILATION N/A 11/14/2018   Procedure: BALLOON DILATION;  Surgeon: Ronnette Juniper, MD;  Location: [place];  Service: Gastroenterology;  Laterality: N/A;  . BASCILIC VEIN TRANSPOSITION Right 02/27/2013   Procedure: BASCILIC VEIN TRANSPOSITION;  Surgeon: Mal Misty, MD;  Location: [place];  Service: Vascular;  Laterality: Right;  Right Basilic Vein Transposition   . BILIARY DILATION  03/31/2019   Procedure: BILIARY DILATION;  Surgeon: Clarene Essex, MD;  Location: [facility] ENDOSCOPY;  Service: Endoscopy;;  . BILIARY DILATION  05/20/2019   Procedure: BILIARY DILATION;  Surgeon: Clarene Essex, MD;  [facility];  Service: Endoscopy;;  . BILIARY STENT PLACEMENT  03/31/2019   Procedure: BILIARY STENT PLACEMENT;  Surgeon: Clarene Essex, MD;  Location: [facility] ENDOSCOPY;  Service: Endoscopy;;  . BILIARY STENT PLACEMENT N/A 05/20/2019   Procedure: BILIARY STENT PLACEMENT;  Surgeon: Clarene Essex, MD;  [facility];  Service: Endoscopy;  Laterality: N/A;  . CARDIAC CATHETERIZATION N/A 08/06/2016   Procedure: Left Heart Cath and Coronary Angiography;  Surgeon: Jolaine Artist, MD;  Location: [place] CV LAB;  Service: Cardiovascular;  Laterality: N/A;  . CARDIAC CATHETERIZATION N/A 08/07/2016   Procedure: Coronary/Graft Atherectomy-CSI LAD;  Surgeon: Peter M Martinique, MD;  Location: [place] [facility];  Service: Cardiovascular;  Laterality: N/A;  . CERVICAL SPINE SURGERY  2/09   "to repair nerve problems in my left arm"  . CHOLECYSTECTOMY    . COLONOSCOPY WITH PROPOFOL N/A 04/26/2017   Procedure: COLONOSCOPY WITH PROPOFOL;  Surgeon: Otis Brace, MD;  Location: [place];  Service: Gastroenterology;  Laterality: N/A;  . CORONARY ANGIOPLASTY WITH STENT PLACEMENT  06/11/2008   . CORONARY ANGIOPLASTY WITH STENT PLACEMENT  06/2007   TAXUS stent to RCA/notes 01/31/2011  . ENDOSCOPIC RETROGRADE CHOLANGIOPANCREATOGRAPHY (ERCP) WITH PROPOFOL N/A 03/31/2019   Procedure: ENDOSCOPIC RETROGRADE CHOLANGIOPANCREATOGRAPHY (ERCP) WITH PROPOFOL;  Surgeon: Clarene Essex, MD;  Location: [place];  Service: Endoscopy;  Laterality: N/A;  . ERCP N/A 11/14/2018   Procedure: ENDOSCOPIC RETROGRADE CHOLANGIOPANCREATOGRAPHY (ERCP);  Surgeon: Ronnette Juniper, MD;  Location: [place];  Service: Gastroenterology;  Laterality: N/A;  . ERCP N/A 05/20/2019   Procedure: ENDOSCOPIC RETROGRADE CHOLANGIOPANCREATOGRAPHY (ERCP);  Surgeon: Clarene Essex, MD;  [facility];  Service: Endoscopy;  Laterality: N/A;  . ESOPHAGOGASTRODUODENOSCOPY  09/28/2011   Procedure: ESOPHAGOGASTRODUODENOSCOPY (EGD);  Surgeon: Missy Sabins, MD;  Location[facility] ENDOSCOPY;  Service: Endoscopy;  Laterality: N/A;  . ESOPHAGOGASTRODUODENOSCOPY N/A 04/07/2015   Procedure: ESOPHAGOGASTRODUODENOSCOPY (EGD);  Surgeon: Teena Irani, MD;  [facility];  Service: Endoscopy;  Laterality: N/A;  . ESOPHAGOGASTRODUODENOSCOPY N/A 04/19/2015   Procedure: ESOPHAGOGASTRODUODENOSCOPY (EGD);  Surgeon: Arta Silence, MD;  Location: [facility] ENDOSCOPY;  Service: Endoscopy;  Laterality: N/A;  . ESOPHAGOGASTRODUODENOSCOPY  11/14/2018   Procedure: ESOPHAGOGASTRODUODENOSCOPY (EGD);  Surgeon: Ronnette Juniper, MD;  Location: [facility] ENDOSCOPY;  Service: Gastroenterology;;  . ESOPHAGOGASTRODUODENOSCOPY (EGD) WITH PROPOFOL N/A 06/13/2018   Procedure: ESOPHAGOGASTRODUODENOSCOPY (EGD) WITH PROPOFOL;  Surgeon: Wonda Horner, MD;  Location: [facility] ENDOSCOPY;  Service: Endoscopy;  Laterality: N/A;  . FLEXIBLE SIGMOIDOSCOPY N/A 05/21/2017   Procedure: Conni Elliot;  Surgeon: Clarene Essex, MD;  Location: [place];  Service: Endoscopy;  Laterality: N/A;  . FLEXIBLE SIGMOIDOSCOPY Left 07/02/2017   Procedure: FLEXIBLE SIGMOIDOSCOPY;  Surgeon: Laurence Spates,  MD;  Location: [place];  Service: Endoscopy;  Laterality: Left;  . FOREIGN BODY REMOVAL  09/2003   via upper endoscopy/notes 02/12/2011  . GIVENS CAPSULE STUDY  09/30/2011   Procedure: GIVENS CAPSULE STUDY;  Surgeon: Jeryl Columbia, MD;  Location: [facility] ENDOSCOPY;  Service: Endoscopy;  Laterality: N/A;  . INSERTION OF DIALYSIS CATHETER Right 2014  . INSERTION OF DIALYSIS CATHETER Left 02/11/2013   Procedure: INSERTION OF DIALYSIS CATHETER;  Surgeon: Conrad Plymouth, MD;  Location: [place];  Service: Vascular;  Laterality: Left;  Ultrasound guided  . IR AV DIALY SHUNT INTRO NEEDLE/INTRACATH INITIAL W/PTA/IMG RIGHT Right 11/14/2018  . IR US GUIDE VASC ACCESS RIGHT  11/14/2018  . LUMBAR LAMINECTOMY/DECOMPRESSION MICRODISCECTOMY Bilateral 07/31/2017   Procedure: LAMINECTOMY AND FORAMINOTOMY- BILATERAL LUMBAR TWO- LUMBAR THREE;  Surgeon: Earnie Larsson, MD;  Location: [place];  Service: Neurosurgery;  Laterality: Bilateral;  LAMINECTOMY AND FORAMINOTOMY- BILATERAL LUMBAR 2- LUMBAR 3  . PERIPHERAL VASCULAR BALLOON ANGIOPLASTY  04/07/2018   Procedure: PERIPHERAL VASCULAR BALLOON ANGIOPLASTY;  Surgeon: Waynetta Sandy, MD;  Location: [place] [facility];  Service: Cardiovascular;;  RUE AVF  . REMOVAL OF A DIALYSIS CATHETER Right 02/11/2013   Procedure: REMOVAL OF A DIALYSIS CATHETER;  Surgeon: Conrad Belden, MD;  Location: [place];  Service: Vascular;  Laterality: Right;  . REMOVAL OF STONES  11/14/2018   Procedure: REMOVAL OF STONES;  Surgeon: Ronnette Juniper, MD;  Location: [facility] ENDOSCOPY;  Service: Gastroenterology;;  . REMOVAL OF STONES  03/31/2019   Procedure: REMOVAL OF STONES;  Surgeon: Clarene Essex, MD;  Location: [place];  Service: Endoscopy;;  . Azzie Almas DILATION N/A 04/07/2015   Procedure: Azzie Almas DILATION;  Surgeon: Teena Irani, MD;  [facility];  Service: Endoscopy;  Laterality: N/A;  . SHUNTOGRAM N/A 09/20/2011   Procedure: Earney Mallet;  Surgeon: Conrad [place], MD;  Location: [facility] CATH LAB;  Service:  Cardiovascular;  Laterality: N/A;  . SPHINCTEROTOMY  11/14/2018   Procedure: SPHINCTEROTOMY;  Surgeon: Ronnette Juniper, MD;  Location: [facility] ENDOSCOPY;  Service: Gastroenterology;;  . Joan Mayans  03/31/2019   Procedure: Joan Mayans;  Surgeon: Clarene Essex, MD;  Location: [facility] ENDOSCOPY;  Service: Endoscopy;;  . SPHINCTEROTOMY  05/20/2019   Procedure: Joan Mayans;  Surgeon: Clarene Essex, MD;  [facility];  Service: Endoscopy;;  . SPYGLASS CHOLANGIOSCOPY N/A 05/20/2019   Procedure: GEZMOQHU CHOLANGIOSCOPY;  Surgeon: Clarene Essex, MD;  [facility];  Service: Endoscopy;  Laterality: N/A;  Bess Kinds LITHOTRIPSY N/A 05/20/2019   Procedure: [id number] LITHOTRIPSY;  Surgeon: Clarene Essex, MD;  [facility];  Service: Endoscopy;  Laterality: N/A;  . STENT REMOVAL  05/20/2019   Procedure: STENT REMOVAL;  Surgeon: Clarene Essex, MD;  [facility];  Service: Endoscopy;;  . SVT ABLATION N/A 11/26/2016   Procedure: SVT Ablation;  Surgeon: Will Meredith Leeds, MD;  Location: [place] [facility];  Service: Cardiovascular;  Laterality: N/A;  . TEE WITHOUT  CARDIOVERSION N/A 08/25/2018   Procedure: TRANSESOPHAGEAL ECHOCARDIOGRAM (TEE);  Surgeon: Dorothy Spark, MD;  Location: [place];  Service: Cardiovascular;  Laterality: N/A;  . TEE WITHOUT CARDIOVERSION N/A 04/02/2019   Procedure: TRANSESOPHAGEAL ECHOCARDIOGRAM (TEE);  Surgeon: Josue Hector, MD;  Location: [facility] ENDOSCOPY;  Service: Cardiovascular;  Laterality: N/A;  . TONSILLECTOMY    . TOTAL KNEE ARTHROPLASTY Right 08/02/2015   Procedure: TOTAL KNEE ARTHROPLASTY;  Surgeon: Renette Butters, MD;  Location: [place];  Service: Orthopedics;  Laterality: Right;  . VENOGRAM N/A 01/26/2013   Procedure: VENOGRAM;  Surgeon: Angelia Mould, MD;  Location: [facility] CATH LAB;  Service: Cardiovascular;  Laterality: N/A;    Family History  Problem Relation Age of Onset  . Heart disease Sister   . Thyroid nodules Sister   . Heart  disease Father   . Diabetes Father   . Kidney failure Father   . Hypertension Father   . Healthy Child   . Healthy Child   . Healthy Child   . Cancer Neg Hx     Social History:  reports that he quit smoking about 13 years ago. His smoking use included cigarettes. He has a 25.00 pack-year smoking history. He quit smokeless tobacco use about 13 years ago. He reports that he does not drink alcohol or use drugs.  Allergies:  Allergies  Allergen Reactions  . Statins Other (See Comments)    Weak muscles  . Ciprofloxacin Rash    Medications; Prior to Admission medications   Medication Sig Start Date End Date Taking? Authorizing Provider  acetaminophen (TYLENOL) 325 MG tablet Take 325 mg by mouth every 6 (six) hours as needed for mild pain or headache.    Yes [provider]  Alirocumab (PRALUENT) 150 MG/ML SOAJ Inject 1 pen into the skin every 14 (fourteen) days. 04/08/19  Yes Lorretta Harp, MD  allopurinol (ZYLOPRIM) 100 MG tablet Take 1 tablet by mouth once daily Patient taking differently: Take 100 mg by mouth at bedtime.  06/12/19  Yes Biagio Borg, MD  aspirin EC 325 MG tablet Take 1 tablet (325 mg total) by mouth daily. 10/10/18  Yes Hongalgi, Lenis Dickinson, MD  betamethasone dipropionate (DIPROLENE) 0.05 % cream Apply 1 application topically daily as needed (leg sores).   Yes [provider]  cinacalcet (SENSIPAR) 90 MG tablet Take 90 mg by mouth 2 (two) times daily.   Yes [provider]  colestipol (COLESTID) 1 g tablet Take 3 tablets (3 g total) by mouth daily. 02/18/19  Yes Renato Shin, MD  ezetimibe (ZETIA) 10 MG tablet Take 10 mg by mouth at bedtime.    Yes [provider]  ferric citrate (AURYXIA) 1 GM 210 MG(Fe) tablet Take 420 mg by mouth 3 (three) times daily with meals.    Yes [provider]  gabapentin (NEURONTIN) 300 MG capsule TAKE 1 TO 2 CAPSULES BY MOUTH AT BEDTIME AS NEEDED FOR PAIN AND FOR SLEEP Patient taking differently:  Take 600 mg by mouth at bedtime.  06/25/19  Yes Biagio Borg, MD  hydroxypropyl methylcellulose / hypromellose (ISOPTO TEARS / GONIOVISC) 2.5 % ophthalmic solution Place 1 drop into both eyes 3 (three) times daily as needed for dry eyes. 05/03/17  Yes Biagio Borg, MD  methimazole (TAPAZOLE) 5 MG tablet Take 1/2 (one-half) tablet by mouth once daily Patient taking differently: Take 2.5 mg by mouth daily.  07/13/19  Yes Renato Shin, MD  midodrine (PROAMATINE) 10 MG tablet Take 10 mg  by mouth See admin instructions. Take 10mg  every Tuesday, Thursday and Saturday    Yes [provider]  multivitamin (RENA-VIT) TABS tablet Take 1 tablet by mouth daily.    Yes [provider]  pantoprazole (PROTONIX) 40 MG tablet Take 40 mg by mouth at bedtime.  11/03/18  Yes [provider]  polyethylene glycol (MIRALAX / GLYCOLAX) 17 g packet Take 17 g by mouth daily as needed for mild constipation. 04/02/19  Yes Aline August, MD  zolpidem (AMBIEN) 5 MG tablet Take 1 tablet (5 mg total) by mouth at bedtime as needed. for sleep Patient taking differently: Take 5 mg by mouth at bedtime as needed for sleep.  07/03/19  Yes Biagio Borg, MD  doxercalciferol ([name]) 4 MCG/2ML injection Inject 1 mL (2 mcg total) into the vein Every Tuesday,Thursday,and Saturday with dialysis. 09/02/18   Kayleen Memos, DO   . allopurinol  100 mg Oral Daily  . aspirin EC  325 mg Oral Daily  . Chlorhexidine Gluconate Cloth  6 each Topical Q0600  . ezetimibe  10 mg Oral QPM  . ferric citrate  630 mg Oral TID WC  . heparin  5,000 Units Subcutaneous Q8H  . insulin aspart  0-9 Units Subcutaneous Q4H  . methimazole  2.5 mg Oral Daily  . multivitamin  1 tablet Oral Daily  . pantoprazole  40 mg Oral QHS  . vancomycin variable dose per unstable renal function (pharmacist dosing)   Does not apply See admin instructions   PRN Meds acetaminophen **OR** acetaminophen, albuterol, HYDROcodone-acetaminophen, hydroxypropyl  methylcellulose / hypromellose, loperamide, ondansetron **OR** ondansetron (ZOFRAN) IV, polyethylene glycol Results for orders placed or performed during the hospital encounter of 07/31/19 (from the past 48 hour(s))  CBG monitoring, ED     Status: Abnormal   Collection Time: 07/31/19 10:50 AM  Result Value Ref Range   Glucose-Capillary 130 (H) 70 - 99 mg/dL   Comment 1 Notify RN    Comment 2 Document in Chart   CBC with Differential     Status: Abnormal   Collection Time: 07/31/19 12:13 PM  Result Value Ref Range   WBC 10.0 4.0 - 10.5 K/uL   RBC 4.47 4.22 - 5.81 MIL/uL   Hemoglobin 13.5 13.0 - 17.0 g/dL   HCT 44.2 39.0 - 52.0 %   MCV 98.9 80.0 - 100.0 fL   MCH 30.2 26.0 - 34.0 pg   MCHC 30.5 30.0 - 36.0 g/dL   RDW 16.6 (H) 11.5 - 15.5 %   Platelets 209 150 - 400 K/uL   nRBC 0.0 0.0 - 0.2 %   Neutrophils Relative % 82 %   Neutro Abs 8.1 (H) 1.7 - 7.7 K/uL   Lymphocytes Relative 6 %   Lymphs Abs 0.6 (L) 0.7 - 4.0 K/uL   Monocytes Relative 12 %   Monocytes Absolute 1.2 (H) 0.1 - 1.0 K/uL   Eosinophils Relative 0 %   Eosinophils Absolute 0.0 0.0 - 0.5 K/uL   Basophils Relative 0 %   Basophils Absolute 0.0 0.0 - 0.1 K/uL   Immature Granulocytes 0 %   Abs Immature Granulocytes 0.04 0.00 - 0.07 K/uL    Comment: Performed at [facility], [address] [place], [place] [address]  Comprehensive metabolic panel     Status: Abnormal   Collection Time: 07/31/19 12:13 PM  Result Value Ref Range   Sodium 138 135 - 145 mmol/L   Potassium 5.7 (H) 3.5 - 5.1 mmol/L   Chloride  95 (L) 98 - 111 mmol/L   CO2 25 22 - 32 mmol/L   Glucose, Bld 148 (H) 70 - 99 mg/dL   BUN 41 (H) 8 - 23 mg/dL   Creatinine, Ser 8.24 (H) 0.61 - 1.24 mg/dL   Calcium 8.9 8.9 - 10.3 mg/dL   Total Protein 7.3 6.5 - 8.1 g/dL   Albumin 3.2 (L) 3.5 - 5.0 g/dL   AST 40 15 - 41 U/L   ALT 63 (H) 0 - 44 U/L   Alkaline Phosphatase 147 (H) 38 - 126 U/L   Total Bilirubin 1.1 0.3 - 1.2 mg/dL   GFR calc non Af Amer 6  (L) >60 mL/min   GFR calc Af Amer 7 (L) >60 mL/min   Anion gap 18 (H) 5 - 15    Comment: Performed at [facility], [address] [place], [place] [address]  Troponin I (High Sensitivity)     Status: Abnormal   Collection Time: 07/31/19 12:13 PM  Result Value Ref Range   Troponin I (High Sensitivity) 32 (H) <18 ng/L    Comment: (NOTE) Elevated high sensitivity troponin I (hsTnI) values and significant  changes across serial measurements may suggest ACS but many other  chronic and acute conditions are known to elevate hsTnI results.  Refer to the "Links" section for chest pain algorithms and additional  guidance. Performed at [facility], [place] [address] [place], [place] [address]   Ethanol     Status: None   Collection Time: 07/31/19 12:13 PM  Result Value Ref Range   Alcohol, Ethyl (B) <10 <10 mg/dL    Comment: (NOTE) Lowest detectable limit for serum alcohol is 10 mg/dL. For medical purposes only. Performed at [facility], [place] [address], [place], [place] [address]   Lactic acid, plasma     Status: Abnormal   Collection Time: 07/31/19 12:13 PM  Result Value Ref Range   Lactic Acid, Venous 2.0 (HH) 0.5 - 1.9 mmol/L    Comment: CRITICAL RESULT CALLED TO, READ BACK BY AND VERIFIED WITH: Jerilynn Mages COFFEY RN [phone] BY A BENNETT Performed at [facility], [place] [address] [place], [place] [address]   APTT     Status: None   Collection Time: 07/31/19 12:13 PM  Result Value Ref Range   aPTT 32 24 - 36 seconds    Comment: Performed at [place] [address], [place], [place] [address]  Protime-INR     Status: None   Collection Time: 07/31/19 12:13 PM  Result Value Ref Range   Prothrombin Time 13.6 11.4 - 15.2 seconds   INR 1.1 0.8 - 1.2    Comment: (NOTE) INR goal varies based on device and disease states. Performed at [facility], [place] [address], [place], [place] [address]   Blood Culture (routine x 2)     Status: None  (Preliminary result)   Collection Time: 07/31/19 12:13 PM   Specimen: BLOOD  Result Value Ref Range   Specimen Description BLOOD LEFT ANTECUBITAL    Special Requests AEROBIC BOTTLE ONLY Blood Culture adequate volume    Culture      NO GROWTH < 24 HOURS Performed at [facility], [place] [address] [place], [place] [address]    Report Status PENDING   Blood Culture (routine x 2)     Status: None (Preliminary result)   Collection Time: 07/31/19 12:13 PM   Specimen: BLOOD  Result Value  Ref Range   Specimen Description BLOOD LEFT ANTECUBITAL    Special Requests      BOTTLES DRAWN AEROBIC AND ANAEROBIC Blood Culture adequate volume   Culture      NO GROWTH < 24 HOURS Performed at [place] [address] [place], [place] [address]    Report Status PENDING   Lipase, blood     Status: None   Collection Time: 07/31/19 12:13 PM  Result Value Ref Range   Lipase 33 11 - 51 U/L    Comment: Performed at [place] [address], [place], [place] [address]  I-stat chem 8, ED (not at [facility] or [facility]     Status: Abnormal   Collection Time: 07/31/19 12:51 PM  Result Value Ref Range   Sodium 136 135 - 145 mmol/L   Potassium 6.8 (HH) 3.5 - 5.1 mmol/L   Chloride 107 98 - 111 mmol/L   BUN 53 (H) 8 - 23 mg/dL   Creatinine, Ser 7.10 (H) 0.61 - 1.24 mg/dL   Glucose, Bld 123 (H) 70 - 99 mg/dL   Calcium, Ion 0.80 (LL) 1.15 - 1.40 mmol/L   TCO2 24 22 - 32 mmol/L   Hemoglobin 10.2 (L) 13.0 - 17.0 g/dL   HCT 30.0 (L) 39.0 - 52.0 %   Comment NOTIFIED PHYSICIAN   SARS Coronavirus 2 by RT PCR (hospital order, performed in [facility] hospital lab) Nasopharyngeal Nasopharyngeal Swab     Status: None   Collection Time: 07/31/19  1:25 PM   Specimen: Nasopharyngeal Swab  Result Value Ref Range   SARS Coronavirus 2 NEGATIVE NEGATIVE    Comment: (NOTE) If result is NEGATIVE SARS-CoV-2 target nucleic acids are NOT DETECTED. The SARS-CoV-2 RNA is generally detectable in upper and lower   respiratory specimens during the acute phase of infection. The lowest  concentration of SARS-CoV-2 viral copies this assay can detect is 250  copies / mL. A negative result does not preclude SARS-CoV-2 infection  and should not be used as the sole basis for treatment or other  patient management decisions.  A negative result may occur with  improper specimen collection / handling, submission of specimen other  than nasopharyngeal swab, presence of viral mutation(s) within the  areas targeted by this assay, and inadequate number of viral copies  (<250 copies / mL). A negative result must be combined with clinical  observations, patient history, and epidemiological information. If result is POSITIVE SARS-CoV-2 target nucleic acids are DETECTED. The SARS-CoV-2 RNA is generally detectable in upper and lower  respiratory specimens dur ing the acute phase of infection.  Positive  results are indicative of active infection with SARS-CoV-2.  Clinical  correlation with patient history and other diagnostic information is  necessary to determine patient infection status.  Positive results do  not rule out bacterial infection or co-infection with other viruses. If result is PRESUMPTIVE POSTIVE SARS-CoV-2 nucleic acids MAY BE PRESENT.   A presumptive positive result was obtained on the submitted specimen  and confirmed on repeat testing.  While 2019 novel coronavirus  (SARS-CoV-2) nucleic acids may be present in the submitted sample  additional confirmatory testing may be necessary for epidemiological  and / or clinical management purposes  to differentiate between  SARS-CoV-2 and other Sarbecovirus currently known to infect humans.  If clinically indicated additional testing with an alternate test  methodology [phone]) is advised. The SARS-CoV-2 RNA is generally  detectable in upper and lower respiratory sp ecimens during the  acute  phase of infection. The expected result is Negative. Fact  Sheet for Patients:  StrictlyIdeas.no Fact Sheet for Healthcare Providers: BankingDealers.co.za This test is not yet approved or cleared by the [place] FDA and has been authorized for detection and/or diagnosis of SARS-CoV-2 by FDA under an Emergency Use Authorization (EUA).  This EUA will remain in effect (meaning this test can be used) for the duration of the COVID-19 declaration under Section 564(b)(1) of the Act, 21 U.S.C. section 360bbb-3(b)(1), unless the authorization is terminated or revoked sooner. Performed at [facility], [place] [address], [place], [place] [address]   Basic metabolic panel     Status: Abnormal   Collection Time: 07/31/19  5:22 PM  Result Value Ref Range   Sodium 139 135 - 145 mmol/L   Potassium 5.7 (H) 3.5 - 5.1 mmol/L   Chloride 98 98 - 111 mmol/L   CO2 25 22 - 32 mmol/L   Glucose, Bld 127 (H) 70 - 99 mg/dL   BUN 46 (H) 8 - 23 mg/dL   Creatinine, Ser 8.48 (H) 0.61 - 1.24 mg/dL   Calcium 8.6 (L) 8.9 - 10.3 mg/dL   GFR calc non Af Amer 6 (L) >60 mL/min   GFR calc Af Amer 7 (L) >60 mL/min   Anion gap 16 (H) 5 - 15    Comment: Performed at [facility] [address] [place], [place] [address]  Lactic acid, plasma     Status: None   Collection Time: 07/31/19  5:22 PM  Result Value Ref Range   Lactic Acid, Venous 1.2 0.5 - 1.9 mmol/L    Comment: Performed at [facility] [address], [place], [place] [address]  TSH     Status: None   Collection Time: 07/31/19  5:22 PM  Result Value Ref Range   TSH 0.564 0.350 - 4.500 uIU/mL    Comment: Performed by a 3rd Generation assay with a functional sensitivity of <=0.01 uIU/mL. Performed at [facility], [place] [address], [place], [place] [address]   T4, free     Status: None   Collection Time: 07/31/19  5:22 PM  Result Value Ref Range   Free T4 0.94 0.61 - 1.12 ng/dL    Comment: (NOTE) Biotin ingestion may interfere with free T4  tests. If the results are inconsistent with the TSH level, previous test results, or the clinical presentation, then consider biotin interference. If needed, order repeat testing after stopping biotin. Performed at [facility], [place] [address] [place], [place] [address]   Troponin I (High Sensitivity)     Status: Abnormal   Collection Time: 07/31/19  8:16 PM  Result Value Ref Range   Troponin I (High Sensitivity) 37 (H) <18 ng/L    Comment: (NOTE) Elevated high sensitivity troponin I (hsTnI) values and significant  changes across serial measurements may suggest ACS but many other  chronic and acute conditions are known to elevate hsTnI results.  Refer to the "Links" section for chest pain algorithms and additional  guidance. Performed at [facility], [place] [address] [place], [place] [address]   HIV Antibody (routine testing w rflx)     Status: None   Collection Time: 07/31/19  8:16 PM  Result Value Ref Range   HIV Screen 4th Generation wRfx NON REACTIVE NON REACTIVE    Comment: Performed at [facility], [place] [address], [place], [place] [address]  Glucose, capillary  Status: Abnormal   Collection Time: 07/31/19  9:53 PM  Result Value Ref Range   Glucose-Capillary 134 (H) 70 - 99 mg/dL   Comment 1 Document in Chart   Glucose, capillary     Status: Abnormal   Collection Time: 08/01/19 12:52 AM  Result Value Ref Range   Glucose-Capillary 115 (H) 70 - 99 mg/dL   Comment 1 Document in Chart   Comprehensive metabolic panel     Status: Abnormal   Collection Time: 08/01/19  2:31 AM  Result Value Ref Range   Sodium 140 135 - 145 mmol/L   Potassium 5.4 (H) 3.5 - 5.1 mmol/L   Chloride 101 98 - 111 mmol/L   CO2 25 22 - 32 mmol/L   Glucose, Bld 130 (H) 70 - 99 mg/dL   BUN 51 (H) 8 - 23 mg/dL   Creatinine, Ser 9.37 (H) 0.61 - 1.24 mg/dL   Calcium 8.7 (L) 8.9 - 10.3 mg/dL   Total Protein 6.8 6.5 - 8.1 g/dL   Albumin 2.9 (L) 3.5 - 5.0 g/dL   AST 25 15 - 41 U/L    ALT 48 (H) 0 - 44 U/L   Alkaline Phosphatase 132 (H) 38 - 126 U/L   Total Bilirubin 0.9 0.3 - 1.2 mg/dL   GFR calc non Af Amer 5 (L) >60 mL/min   GFR calc Af Amer 6 (L) >60 mL/min   Anion gap 14 5 - 15    Comment: Performed at [facility], [address], [place], [place] [address]  CBC     Status: Abnormal   Collection Time: 08/01/19  2:31 AM  Result Value Ref Range   WBC 8.9 4.0 - 10.5 K/uL   RBC 3.31 (L) 4.22 - 5.81 MIL/uL   Hemoglobin 10.1 (L) 13.0 - 17.0 g/dL    Comment: REPEATED TO VERIFY   HCT 32.1 (L) 39.0 - 52.0 %   MCV 97.0 80.0 - 100.0 fL   MCH 30.5 26.0 - 34.0 pg   MCHC 31.5 30.0 - 36.0 g/dL   RDW 16.6 (H) 11.5 - 15.5 %   Platelets 216 150 - 400 K/uL   nRBC 0.0 0.0 - 0.2 %    Comment: Performed at [facility], [place] [address], [place], [place] [address]  Glucose, capillary     Status: Abnormal   Collection Time: 08/01/19  5:12 AM  Result Value Ref Range   Glucose-Capillary 116 (H) 70 - 99 mg/dL   Comment 1 Document in Chart   Glucose, capillary     Status: None   Collection Time: 08/01/19  8:52 AM  Result Value Ref Range   Glucose-Capillary 96 70 - 99 mg/dL   *Note: Due to a large number of results and/or encounters for the requested time period, some results have not been displayed. A complete set of results can be found in Results Review.    Ct Head Wo Contrast  Result Date: 07/31/2019 CLINICAL DATA:  Encephalopathy EXAM: CT HEAD WITHOUT CONTRAST TECHNIQUE: Contiguous axial images were obtained from the base of the skull through the vertex without intravenous contrast. COMPARISON:  10/04/2018 FINDINGS: Brain: Stable atrophy and chronic white matter microvascular ischemic changes throughout both cerebral hemispheres. No acute intracranial hemorrhage, new mass lesion, definite infarction, midline shift, herniation, hydrocephalus, or extra-axial fluid collection. No focal mass effect or edema. Cisterns are patent. Minor cerebellar atrophy. Vascular: No  hyperdense vessel or unexpected calcification. Skull: Normal. Negative for fracture or focal lesion. Sinuses/Orbits: No  acute finding. Other: None. IMPRESSION: Stable atrophy and chronic white matter microvascular ischemic changes. No interval change or acute process by noncontrast CT. Electronically Signed   By: Jerilynn Mages.  Shick M.D.   On: 07/31/2019 15:27   Dg Chest Portable 1 View  Result Date: 07/31/2019 CLINICAL DATA:  Chest pain, fever, dialysis patient. EXAM: PORTABLE CHEST 1 VIEW COMPARISON:  07/03/2019 FINDINGS: Borderline enlarged cardiac silhouette. The aorta remains tortuous and calcified. Stable linear scarring in the left mid lung zone. Mildly prominent interstitial markings with improvement. Cervical spine fixation hardware. Right shoulder degenerative changes. IMPRESSION: 1. No acute abnormality. 2. Stable borderline cardiomegaly. 3. Mild chronic interstitial lung disease. Electronically Signed   By: Claudie Revering M.D.   On: 07/31/2019 11:49   Ct Renal Stone Study  Result Date: 07/31/2019 CLINICAL DATA:  Abdominal pain. EXAM: CT ABDOMEN AND PELVIS WITHOUT CONTRAST TECHNIQUE: Multidetector CT imaging of the abdomen and pelvis was performed following the standard protocol without IV contrast. COMPARISON:  11/11/2018 FINDINGS: Lower chest: Minimal bilateral dependent atelectasis or scarring, without significant change. Stable 6 mm partially calcified subpleural nodule in the lateral aspect of the right upper lobe, anteriorly, on image number 7 series 5. Borderline enlarged heart. Atheromatous coronary artery calcifications. Hepatobiliary: Intrahepatic biliary air is again demonstrated with stable mild dilatation of the intrahepatic ducts. A biliary stent extending from the proximal common duct into the duodenum is currently demonstrated without the previously demonstrated extrahepatic biliary ductal dilatation. Cholecystectomy clips. Pancreas: Unremarkable. No pancreatic ductal dilatation or  surrounding inflammatory changes. Spleen: Normal in size without focal abnormality. Adrenals/Urinary Tract: Normal appearing adrenal glands. The kidneys remains small, containing multiple cysts and vascular calcifications. No significant urine in the urinary bladder. No visible ureteral abnormalities. Stomach/Bowel: Stomach is within normal limits. Appendix appears normal. No evidence of bowel wall thickening, distention, or inflammatory changes. Vascular/Lymphatic: Atheromatous arterial calcifications without aneurysm. No enlarged lymph nodes. Reproductive: Prostate is unremarkable. Other: No abdominal wall hernia or abnormality. No abdominopelvic ascites. Musculoskeletal: Ray cages at the L3-4 level and solid interbody bone and pedicle screw and rod fusion at the L4-5 level. Lumbar and lower thoracic spine degenerative changes. IMPRESSION: No acute abnormality. Electronically Signed   By: Claudie Revering M.D.   On: 07/31/2019 15:35               Blood pressure (!) 126/54, pulse 76, temperature (!) 97.5 F (36.4 C), temperature source Oral, resp. rate (!) 22, height 6\' 1"  (1.854 m), weight 113.4 kg, SpO2 100 %.  Physical exam:   General--talkative African-American male in no distress ENT--nonicteric Neck--full range of motion Heart--normal S1 and S2 without murmurs or gallops Lungs--clear Abdomen--nondistended with few bowel sounds.  Mild right upper quadrant tenderness Psych--patient somewhat upset that he needs another ERCP.   Assessment: 1.  Possible early sepsis.  Chest x-ray, CT of the abdomen etc. shows no etiology.  He is tender in the right upper quadrant has been having increasing tenderness just like before.  He has this biliary stent in place and the last note indicated it should be changed about this time any work.  Think we have to assume that this is the source.  He feels better on antibiotics and cultures are still pending. 2.  CBD stones.  Has been difficult to clear his  CBD despite multiple ERCPs and sphincterotomies. 3.  Status post cholecystectomy 4.  ESRD on hemodialysis 5.  History of CHF 6.  Diabetes type 2 7.  History of PSVT.  Status post  ablation 2018 8.  OSA on CPAP  Plan: 1. Will plan ERCP by either DR magod or Dr Paulita Fujita scheduled for Monday at 11:30. Dicussed with pt 2. Continue ABs for now pending cultures 3. Will go ahead and allow clears.   Nancy Fetter 08/01/2019, 10:34 AM   This note was created using voice recognition software and minor errors may Have occurred unintentionally. Pager: [phone] If no answer or after hours call [phone]

## 2019-08-01 NOTE — Progress Notes (Signed)
Pt returned from dialysis, wife is at bedside. Pt and his wife are fighting and arguing. Wife got up and shut door so other people wouldn't hear them.

## 2019-08-01 NOTE — Evaluation (Signed)
PT Cancellation Note  Patient Details Name: JAMIAH RECORE MRN: [id number] DOB: 10-11-1950   Cancelled Treatment:    Reason Eval/Treat Not Completed: Patient at procedure or test/unavailable. Patient at dialysis this afternoon. Will re-attempt tomorrow.     Yuridia Couts 08/01/2019, 1:45 PM

## 2019-08-01 NOTE — Procedures (Signed)
   I was present at this dialysis session, have reviewed the session itself and made  appropriate changes Kelly Splinter MD [facility] pager [phone]   08/01/2019, 2:03 PM

## 2019-08-01 NOTE — Progress Notes (Signed)
CCMD called at 11:15pm to notify nurse about ST elevation of 2.3.  NP Blount notified at 11:22pm.  Will perform an EKG and continue to monitor.

## 2019-08-01 NOTE — Progress Notes (Signed)
  Patient was educated on the need to have EKG performed but patient refused the procedure.  NP Blount made aware.

## 2019-08-01 NOTE — Progress Notes (Addendum)
[facility]                                                                              Patient Demographics  [name], is a 68 y.o. male, DOB - Feb 25, 1951, ZOX:[id number]  Admit date - 07/31/2019   Admitting Physician Alma Friendly, MD  Outpatient Primary MD for the patient is Biagio Borg, MD  Outpatient specialists:   LOS - 1  days    No chief complaint on file.      Brief summary   [name] is a 68 y.o. male with medical history significant for  but not limited to dementia, diabetes mellitus type 2, ESRD on HD T/TH/S, CAD, CHF, presenting with few days history of generalized weakness and mental status change associated with fever of 100.4, mild tachycardia, hypotensive. COVID-19 was negative, no acute infiltrate on chest x-ray.  Patient started on IV antibiotics admitted for early sepsis.  Assessment & Plan    Principal Problem:   Sepsis ([place]) Active Problems:   Gout   Dementia ([place])   ESRD on hemodialysis ([place])   Essential hypertension  Sepsis Presents with tachycardia, hypotension, elevated lactic acid level Source unclear, high risk for cholangitis given history of multiple gallstones with multiple ERCP Follow-up blood cultures Continue broad-spectrum IV antibiotics Continue IV fluids Seen by GI, ERCP plan for Monday, 08/02/2019; allow clears  Metabolic encephalopathy, acute CT head negative Improving, follow-up   End-stage renal disease Routine hemodialysis Monitor electrolytes and renal function per nephrology    Chronic systolic/diastolic HF Appears euvolemic TEE done on 7/20 showed EF of 60 to 65% Volume status managed with HD No longer on ACE/ARB's, BB due to low BP with ESRD  CAD status post stent to RCA in 2008, recurrent NSTEMI Chest pain free Continue aspirin No longer on Plavix due to rectal bleeding from possible radiation proctitis Continue Zetia, [facility]-9  Diabetes mellitus type 2 Last A1c on  05/2019 was 6 Diet-controlled SSI, Accu-Cheks, hypoglycemic protocol  Hyperthyroidism TSH, free T4 pending Continue methimazole  COPD On O2 at night  OSA on CPAP CPAP ordered QHS      Code Status: Full code DVT Prophylaxis:   heparin  Family Communication: Discussed in detail with the patient, all imaging results, lab results explained to the patient  Disposition Plan: to be determined  Time Spent in minutes 35 minutes  Procedures:    Consultants:   GI Nephrology  Antimicrobials:      Medications  Scheduled Meds:  allopurinol  100 mg Oral Daily   aspirin EC  325 mg Oral Daily   Chlorhexidine Gluconate Cloth  6 each Topical Q0600   cinacalcet  90 mg Oral Q supper   darbepoetin (ARANESP) injection - DIALYSIS  40 mcg Intravenous Q Sat-HD   doxercalciferol  3 mcg Intravenous Q T,Th,Sa-HD   ezetimibe  10 mg Oral QPM   ferric citrate  630 mg Oral TID WC   heparin  5,000 Units Subcutaneous Q8H   insulin aspart  0-9 Units Subcutaneous Q4H   methimazole  2.5 mg Oral Daily   midodrine  10 mg Oral Q T,Th,Sa-HD  multivitamin  1 tablet Oral Daily   pantoprazole  40 mg Oral QHS   vancomycin variable dose per unstable renal function (pharmacist dosing)   Does not apply See admin instructions   Continuous Infusions:  ceFEPime (MAXIPIME) IV 1 g (08/01/19 1134)   metronidazole 500 mg (08/01/19 0559)   PRN Meds:.acetaminophen **OR** acetaminophen, albuterol, HYDROcodone-acetaminophen, hydroxypropyl methylcellulose / hypromellose, loperamide, ondansetron **OR** ondansetron (ZOFRAN) IV, polyethylene glycol   Antibiotics   Anti-infectives (From admission, onward)   Start     Dose/Rate Route Frequency Ordered Stop   08/01/19 0600  ceFEPIme (MAXIPIME) 1 g in sodium chloride 0.9 % 100 mL IVPB  Status:  Discontinued     1 g 200 mL/hr over 30 Minutes Intravenous Every 8 hours 07/31/19 1738 07/31/19 2038   08/01/19 0000  vancomycin (VANCOCIN) 2,500 mg in  sodium chloride 0.9 % 500 mL IVPB  Status:  Discontinued     2,500 mg 250 mL/hr over 120 Minutes Intravenous Every 24 hours 07/31/19 1738 07/31/19 1742   07/31/19 2200  metroNIDAZOLE (FLAGYL) IVPB 500 mg     500 mg 100 mL/hr over 60 Minutes Intravenous Every 8 hours 07/31/19 1737     07/31/19 1745  vancomycin (VANCOCIN) 2,500 mg in sodium chloride 0.9 % 500 mL IVPB  Status:  Discontinued     2,500 mg 250 mL/hr over 120 Minutes Intravenous Every 24 hours 07/31/19 1737 07/31/19 1738   07/31/19 1745  ceFEPIme (MAXIPIME) 1 g in sodium chloride 0.9 % 100 mL IVPB  Status:  Discontinued     1 g 200 mL/hr over 30 Minutes Intravenous Every 8 hours 07/31/19 1737 07/31/19 1738   07/31/19 1741  vancomycin variable dose per unstable renal function (pharmacist dosing)      Does not apply See admin instructions 07/31/19 1742     07/31/19 1345  ceFEPIme (MAXIPIME) 1 g in sodium chloride 0.9 % 100 mL IVPB     1 g 200 mL/hr over 30 Minutes Intravenous  Once 07/31/19 1332 07/31/19 1445   07/31/19 1130  ceFEPIme (MAXIPIME) 2 g in sodium chloride 0.9 % 100 mL IVPB  Status:  Discontinued     2 g 200 mL/hr over 30 Minutes Intravenous  Once 07/31/19 1118 07/31/19 1123   07/31/19 1130  metroNIDAZOLE (FLAGYL) IVPB 500 mg  Status:  Discontinued     500 mg 100 mL/hr over 60 Minutes Intravenous  Once 07/31/19 1118 07/31/19 1737   07/31/19 1130  vancomycin (VANCOCIN) IVPB 1000 mg/200 mL premix  Status:  Discontinued     1,000 mg 200 mL/hr over 60 Minutes Intravenous  Once 07/31/19 1118 07/31/19 1121   07/31/19 1130  vancomycin (VANCOCIN) 2,500 mg in sodium chloride 0.9 % 500 mL IVPB     2,500 mg 250 mL/hr over 120 Minutes Intravenous  Once 07/31/19 1121 07/31/19 1840   07/31/19 1130  ceFEPIme (MAXIPIME) 1 g in sodium chloride 0.9 % 100 mL IVPB     1 g 200 mL/hr over 30 Minutes Intravenous Every 24 hours 07/31/19 1123          Subjective:   [name] was seen and examined today.  No acute events overnight.   No fever chills  Objective:   Vitals:   07/31/19 1830 07/31/19 1930 08/01/19 0100 08/01/19 0514  BP: (!) 101/53 122/60  (!) 126/54  Pulse:   77 76  Resp:  12 (!) 22   Temp:  99.3 F (37.4 C)  (!) 97.5 F (36.4 C)  TempSrc:  Oral  Oral  SpO2:  98% 98% 100%  Weight:      Height:        Intake/Output Summary (Last 24 hours) at 08/01/2019 1143 Last data filed at 08/01/2019 0900 Gross per 24 hour  Intake 1390 ml  Output --  Net 1390 ml     Wt Readings from Last 3 Encounters:  07/31/19 113.4 kg  07/08/19 102.9 kg  07/03/19 102.5 kg     Exam  General: NAD  HEENT: NCAT,  PERRL,MMM  Neck: SUPPLE, (-) JVD  Cardiovascular: RRR, (-) GALLOP, (-) MURMUR  Respiratory: CTA  Gastrointestinal: SOFT, (-) DISTENSION, BS(+), (+) mild RUQ TENDERNESS  Ext: (-) CYANOSIS, (-) EDEMA  Neuro: A, OX 3  Skin:(-) RASH  Psych:NORMAL AFFECT/MOOD   Data Reviewed:  I have personally reviewed following labs and imaging studies  Micro Results Recent Results (from the past 240 hour(s))  Blood Culture (routine x 2)     Status: None (Preliminary result)   Collection Time: 07/31/19 12:13 PM   Specimen: BLOOD  Result Value Ref Range Status   Specimen Description BLOOD LEFT ANTECUBITAL  Final   Special Requests AEROBIC BOTTLE ONLY Blood Culture adequate volume  Final   Culture   Final    NO GROWTH < 24 HOURS Performed at [facility], [address], [place], [place] [address]    Report Status PENDING  Incomplete  Blood Culture (routine x 2)     Status: None (Preliminary result)   Collection Time: 07/31/19 12:13 PM   Specimen: BLOOD  Result Value Ref Range Status   Specimen Description BLOOD LEFT ANTECUBITAL  Final   Special Requests   Final    BOTTLES DRAWN AEROBIC AND ANAEROBIC Blood Culture adequate volume   Culture   Final    NO GROWTH < 24 HOURS Performed at [facility], [place] [address], [place], [place] [address]    Report Status PENDING  Incomplete  SARS  Coronavirus 2 by RT PCR (hospital order, performed in [facility] hospital lab) Nasopharyngeal Nasopharyngeal Swab     Status: None   Collection Time: 07/31/19  1:25 PM   Specimen: Nasopharyngeal Swab  Result Value Ref Range Status   SARS Coronavirus 2 NEGATIVE NEGATIVE Final    Comment: (NOTE) If result is NEGATIVE SARS-CoV-2 target nucleic acids are NOT DETECTED. The SARS-CoV-2 RNA is generally detectable in upper and lower  respiratory specimens during the acute phase of infection. The lowest  concentration of SARS-CoV-2 viral copies this assay can detect is 250  copies / mL. A negative result does not preclude SARS-CoV-2 infection  and should not be used as the sole basis for treatment or other  patient management decisions.  A negative result may occur with  improper specimen collection / handling, submission of specimen other  than nasopharyngeal swab, presence of viral mutation(s) within the  areas targeted by this assay, and inadequate number of viral copies  (<250 copies / mL). A negative result must be combined with clinical  observations, patient history, and epidemiological information. If result is POSITIVE SARS-CoV-2 target nucleic acids are DETECTED. The SARS-CoV-2 RNA is generally detectable in upper and lower  respiratory specimens dur ing the acute phase of infection.  Positive  results are indicative of active infection with SARS-CoV-2.  Clinical  correlation with patient history and other diagnostic information is  necessary to determine patient infection status.  Positive results do  not rule out bacterial infection or co-infection  with other viruses. If result is PRESUMPTIVE POSTIVE SARS-CoV-2 nucleic acids MAY BE PRESENT.   A presumptive positive result was obtained on the submitted specimen  and confirmed on repeat testing.  While 2019 novel coronavirus  (SARS-CoV-2) nucleic acids may be present in the submitted sample  additional confirmatory testing may be  necessary for epidemiological  and / or clinical management purposes  to differentiate between  SARS-CoV-2 and other Sarbecovirus currently known to infect humans.  If clinically indicated additional testing with an alternate test  methodology [phone]) is advised. The SARS-CoV-2 RNA is generally  detectable in upper and lower respiratory sp ecimens during the acute  phase of infection. The expected result is Negative. Fact Sheet for Patients:  StrictlyIdeas.no Fact Sheet for Healthcare Providers: BankingDealers.co.za This test is not yet approved or cleared by the [place] FDA and has been authorized for detection and/or diagnosis of SARS-CoV-2 by FDA under an Emergency Use Authorization (EUA).  This EUA will remain in effect (meaning this test can be used) for the duration of the COVID-19 declaration under Section 564(b)(1) of the Act, 21 U.S.C. section 360bbb-3(b)(1), unless the authorization is terminated or revoked sooner. Performed at [facility], [place] [address], [place], [place] [address]     Radiology Reports Dg Chest 2 View  Result Date: 07/03/2019 CLINICAL DATA:  Abnormal EKG, weakness and decreased stamina for 1 week, nonradiating generalized dull chest pain, shortness of breath with exertion, history end-stage renal disease on dialysis, diabetes mellitus, hypertension EXAM: CHEST - 2 VIEW COMPARISON:  03/26/2019 FINDINGS: Normal heart size and pulmonary vascularity. Atherosclerotic calcification aorta. Mediastinal contours otherwise normal. Mild bibasilar atelectasis. No infiltrate, pleural effusion or pneumothorax. Prior cervical spine fusion. Vascular stents noted in the upper RIGHT arm. Scattered degenerative disc disease changes thoracic spine. IMPRESSION: Bibasilar atelectasis. No acute abnormalities. Electronically Signed   By: Lavonia Dana M.D.   On: 07/03/2019 15:59   Ct Head Wo Contrast  Result Date:  07/31/2019 CLINICAL DATA:  Encephalopathy EXAM: CT HEAD WITHOUT CONTRAST TECHNIQUE: Contiguous axial images were obtained from the base of the skull through the vertex without intravenous contrast. COMPARISON:  10/04/2018 FINDINGS: Brain: Stable atrophy and chronic white matter microvascular ischemic changes throughout both cerebral hemispheres. No acute intracranial hemorrhage, new mass lesion, definite infarction, midline shift, herniation, hydrocephalus, or extra-axial fluid collection. No focal mass effect or edema. Cisterns are patent. Minor cerebellar atrophy. Vascular: No hyperdense vessel or unexpected calcification. Skull: Normal. Negative for fracture or focal lesion. Sinuses/Orbits: No acute finding. Other: None. IMPRESSION: Stable atrophy and chronic white matter microvascular ischemic changes. No interval change or acute process by noncontrast CT. Electronically Signed   By: Jerilynn Mages.  Shick M.D.   On: 07/31/2019 15:27   Dg Chest Portable 1 View  Result Date: 07/31/2019 CLINICAL DATA:  Chest pain, fever, dialysis patient. EXAM: PORTABLE CHEST 1 VIEW COMPARISON:  07/03/2019 FINDINGS: Borderline enlarged cardiac silhouette. The aorta remains tortuous and calcified. Stable linear scarring in the left mid lung zone. Mildly prominent interstitial markings with improvement. Cervical spine fixation hardware. Right shoulder degenerative changes. IMPRESSION: 1. No acute abnormality. 2. Stable borderline cardiomegaly. 3. Mild chronic interstitial lung disease. Electronically Signed   By: Claudie Revering M.D.   On: 07/31/2019 11:49   Vas [place] Burnard Bunting With/wo Tbi  Result Date: 07/08/2019 LOWER EXTREMITY DOPPLER STUDY Indications: Claudication, ulceration, and peripheral artery disease. High Risk Factors: Hypertension, hyperlipidemia, Diabetes, past history of  smoking, prior MI, coronary artery disease.  Performing Technologist: Burley Saver RVT  Examination Guidelines: A complete evaluation includes at  minimum, Doppler waveform signals and systolic blood pressure reading at the level of bilateral brachial, anterior tibial, and posterior tibial arteries, when vessel segments are accessible. Bilateral testing is considered an integral part of a complete examination. Photoelectric Plethysmograph (PPG) waveforms and toe systolic pressure readings are included as required and additional duplex testing as needed. Limited examinations for reoccurring indications may be performed as noted.  ABI Findings: +---------+------------------+-----+-------------------+--------+  Right     Rt Pressure (mmHg) Index Waveform            Comment   +---------+------------------+-----+-------------------+--------+  Brachial                                               AVF       +---------+------------------+-----+-------------------+--------+  PTA       69                 0.51  dampened monophasic           +---------+------------------+-----+-------------------+--------+  DP        66                 0.49  dampened monophasic           +---------+------------------+-----+-------------------+--------+  Great Toe 44                 0.32  Abnormal                      +---------+------------------+-----+-------------------+--------+ +---------+------------------+-----+-------------------+-------+  Left      Lt Pressure (mmHg) Index Waveform            Comment  +---------+------------------+-----+-------------------+-------+  Brachial  136                                                   +---------+------------------+-----+-------------------+-------+  PTA       51                 0.38  dampened monophasic          +---------+------------------+-----+-------------------+-------+  DP        61                 0.45  dampened monophasic          +---------+------------------+-----+-------------------+-------+  Great Toe 26                 0.19  Abnormal                     +---------+------------------+-----+-------------------+-------+  +-------+-----------+-----------+------------+------------+  ABI/TBI Today's ABI Today's TBI Previous ABI Previous TBI  +-------+-----------+-----------+------------+------------+  Right   0.51        0.32        0.78         0.61          +-------+-----------+-----------+------------+------------+  Left    0.45        0.19        0.50         0.40          +-------+-----------+-----------+------------+------------+ Right  ABIs and TBIs appear decreased compared to prior study on 05/07/2018. Left TBIs appear decreased compared to prior study on 05/07/2018. Left ABIs appear slightly decreased when compared to previous study.  Summary: Right: Resting right ankle-brachial index indicates moderate right lower extremity arterial disease. The right toe-brachial index is abnormal. RT great toe pressure = 44 mmHg. PPG tracings appear dampened. Left: Resting left ankle-brachial index indicates severe left lower extremity arterial disease. The left toe-brachial index is abnormal. LT Great toe pressure = 26 mmHg. PPG tracings appear dampened.  *See table(s) above for measurements and observations.  Electronically signed by Ruta Hinds MD on 07/08/2019 at 1:51:45 PM.    Final    Ct Renal Stone Study  Result Date: 07/31/2019 CLINICAL DATA:  Abdominal pain. EXAM: CT ABDOMEN AND PELVIS WITHOUT CONTRAST TECHNIQUE: Multidetector CT imaging of the abdomen and pelvis was performed following the standard protocol without IV contrast. COMPARISON:  11/11/2018 FINDINGS: Lower chest: Minimal bilateral dependent atelectasis or scarring, without significant change. Stable 6 mm partially calcified subpleural nodule in the lateral aspect of the right upper lobe, anteriorly, on image number 7 series 5. Borderline enlarged heart. Atheromatous coronary artery calcifications. Hepatobiliary: Intrahepatic biliary air is again demonstrated with stable mild dilatation of the intrahepatic ducts. A biliary stent extending from the proximal common duct  into the duodenum is currently demonstrated without the previously demonstrated extrahepatic biliary ductal dilatation. Cholecystectomy clips. Pancreas: Unremarkable. No pancreatic ductal dilatation or surrounding inflammatory changes. Spleen: Normal in size without focal abnormality. Adrenals/Urinary Tract: Normal appearing adrenal glands. The kidneys remains small, containing multiple cysts and vascular calcifications. No significant urine in the urinary bladder. No visible ureteral abnormalities. Stomach/Bowel: Stomach is within normal limits. Appendix appears normal. No evidence of bowel wall thickening, distention, or inflammatory changes. Vascular/Lymphatic: Atheromatous arterial calcifications without aneurysm. No enlarged lymph nodes. Reproductive: Prostate is unremarkable. Other: No abdominal wall hernia or abnormality. No abdominopelvic ascites. Musculoskeletal: Ray cages at the L3-4 level and solid interbody bone and pedicle screw and rod fusion at the L4-5 level. Lumbar and lower thoracic spine degenerative changes. IMPRESSION: No acute abnormality. Electronically Signed   By: Claudie Revering M.D.   On: 07/31/2019 15:35    Lab Data:  CBC: Recent Labs  Lab 07/31/19 1213 07/31/19 1251 08/01/19 0231  WBC 10.0  --  8.9  NEUTROABS 8.1*  --   --   HGB 13.5 10.2* 10.1*  HCT 44.2 30.0* 32.1*  MCV 98.9  --  97.0  PLT 209  --  809   Basic Metabolic Panel: Recent Labs  Lab 07/31/19 1213 07/31/19 1251 07/31/19 1722 08/01/19 0231  NA 138 136 139 140  K 5.7* 6.8* 5.7* 5.4*  CL 95* 107 98 101  CO2 25  --  25 25  GLUCOSE 148* 123* 127* 130*  BUN 41* 53* 46* 51*  CREATININE 8.24* 7.10* 8.48* 9.37*  CALCIUM 8.9  --  8.6* 8.7*   GFR: Estimated Creatinine Clearance: 9.7 mL/min (A) (by C-G formula based on SCr of 9.37 mg/dL (H)). Liver Function Tests: Recent Labs  Lab 07/31/19 1213 08/01/19 0231  AST 40 25  ALT 63* 48*  ALKPHOS 147* 132*  BILITOT 1.1 0.9  PROT 7.3 6.8  ALBUMIN 3.2*  2.9*   Recent Labs  Lab 07/31/19 1213  LIPASE 33   No results for input(s): AMMONIA in the last 168 hours. Coagulation Profile: Recent Labs  Lab 07/31/19 1213  INR 1.1   Cardiac Enzymes: No results for input(s): CKTOTAL, CKMB,  CKMBINDEX, TROPONINI in the last 168 hours. BNP (last 3 results) No results for input(s): PROBNP in the last 8760 hours. HbA1C: No results for input(s): HGBA1C in the last 72 hours. CBG: Recent Labs  Lab 07/31/19 1050 07/31/19 2153 08/01/19 0052 08/01/19 0512 08/01/19 0852  GLUCAP 130* 134* 115* 116* 96   Lipid Profile: No results for input(s): CHOL, HDL, LDLCALC, TRIG, CHOLHDL, LDLDIRECT in the last 72 hours. Thyroid Function Tests: Recent Labs    07/31/19 1722  TSH 0.564  FREET4 0.94   Anemia Panel: No results for input(s): VITAMINB12, FOLATE, FERRITIN, TIBC, IRON, RETICCTPCT in the last 72 hours. Urine analysis:    Component Value Date/Time   COLORURINE LT. YELLOW 06/26/2013 1200   APPEARANCEUR CLEAR 06/26/2013 1200   LABSPEC 1.020 06/26/2013 1200   PHURINE 8.5 06/26/2013 1200   GLUCOSEU 100 06/26/2013 1200   HGBUR MODERATE 06/26/2013 1200   BILIRUBINUR NEGATIVE 06/26/2013 1200   KETONESUR NEGATIVE 06/26/2013 1200   PROTEINUR >300 (A) 01/04/2010 1032   UROBILINOGEN 0.2 06/26/2013 1200   NITRITE NEGATIVE 06/26/2013 1200   LEUKOCYTESUR TRACE 06/26/2013 1200     Benito Mccreedy M.D. [facility] 08/01/2019, 11:43 AM  Pager: [phone] Between 7am to 7pm - call Pager - [phone]  After 7pm go to [email or web address] - password [email or web address]  Call night coverage person covering after 7pm

## 2019-08-01 NOTE — Consult Note (Addendum)
[facility] Renal Consultation Note    Indication for Consultation:  Management of ESRD/hemodialysis, anemia, hypertension/volume, and secondary hyperparathyroidism. PCP:  HPI: [name] is a 68 y.o. male with a PMH including ESRD on hemodialysis, cholecystectomy and recurrent gallstones, CAD, CHF, T2DM, who presented to the ED on 07/31/2019 with weakness and confusion. Patient reports he began feeling weak after dialysis on 07/30/2019. He became progressively more weak over the next 24 hours and began experiencing abdominal pain. He thought he may be constipated and took miralax, which resulted in immediate diarrhea. He began having increasing confusion and presented to the ED. In the ED, T 100.4, tachycardic, BP slightly hypotensive, COVID 19 negative. CXR showed chronic interstitial lung disease and CT renal stone/CT head were unremarkable. He was noted to have a K+ of 6.8 which was treated with calcium gluconate and bicarb. K+ 5.4 this AM. Of note, he was recently changed from 2K bath to a 3K bath for cramping. He was started on cefepime, flagyl and vancomycin. GI was consulted.   Today, patient reports he is feeling much better. Reports ongoing abdominal pain and lack of appetite but no nausea, diarrhea, or chills. Denies SOB, dyspnea, orthopnea, CP, palpitations, and peripheral edema. Weight yesterday is 11.4kg over EDW however I doubt this is accurate given lack of peripheral edema on exam/ pulm edema on CXR. Reports compliance with HD and no recent issues with treatments.   Past Medical History:  Diagnosis Date  . Acute encephalopathy 10/04/2018  . Allergic rhinitis, cause unspecified 02/24/2014  . Anemia 06/16/2011  . BENIGN PROSTATIC HYPERTROPHY 10/14/2009  . CAD, NATIVE VESSEL 02/06/2009   a. 06/2007 s/p Taxus DES to the RCA;  b. 08/2016 NSTEMI in setting of SVT/PCI: LM 30ost, LAD 62m (3.0x16 Synergy DES), LCX 48m, OM1 60, RI 40, RCA 70p/m, 40m - not amenable to PCI.  [name]  Cervical radiculopathy, chronic 02/23/2016   Right c5-6 by NCS/EMG  . Chest pain 08/09/2015  . Chronic combined systolic (congestive) and diastolic (congestive) heart failure ([place])    a. 10/2016 Echo: EF 40-45%, Gr2 DD. mildly dil LA.  [name] COLONIC POLYPS, HX OF 10/14/2009  . Dementia ([place]) [id number]  . Depression 09/24/2015  . DIABETES MELLITUS, TYPE II 02/01/2010  . DYSLIPIDEMIA 06/18/2007  . ESRD (end stage renal disease) on dialysis ([place]) 08/04/2010   ESRD due to DM adn HTN, started HD in 2011. As of 2019 has a R arm graft and gets HD on TTS sched  . FOOT PAIN 08/12/2008  . GAIT DISTURBANCE 03/03/2010  . GASTROENTERITIS, VIRAL 10/14/2009  . GERD 06/18/2007  . GOITER, MULTINODULAR 12/26/2007  . GOUT 06/18/2007  . GYNECOMASTIA 07/17/2010  . Hemodialysis access, fistula mature [facility])    Dialysis T-Th-Sa ([place]) Right upper arm fistula  . Hyperlipidemia 10/16/2011  . Hyperparathyroidism, secondary ([place]) 06/16/2011  . HYPERTENSION 06/18/2007  . Hyperthyroidism   . Hypocalcemia 06/07/2010  . Ischemic cardiomyopathy    a. 10/2016 Echo: EF 40-45%.  . Lumbar stenosis with neurogenic claudication   . ONYCHOMYCOSIS, TOENAILS 12/26/2007  . OSA on CPAP 10/16/2011  . Other malaise and fatigue 11/24/2009  . PERIPHERAL NEUROPATHY 06/18/2007  . Prostate cancer ([place])   . PSVT (paroxysmal supraventricular tachycardia) ([place])    a. 42/8768 complicated by NSTEMI;  b. 11/2016 Treated w/ adenosine in ED;  c. 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  [name] PULMONARY NODULE, RIGHT LOWER LOBE 06/08/2009  . Sleep apnea    cpap machine and o2  .  TRANSAMINASES, SERUM, ELEVATED 02/01/2010  . Transfusion history    none recent  . Unspecified hypotension 01/30/2010   Past Surgical History:  Procedure Laterality Date  . A/V SHUNTOGRAM N/A 04/07/2018   Procedure: A/V SHUNTOGRAM;  Surgeon: Waynetta Sandy, MD;  Location: [place] [facility];  Service: Cardiovascular;  Laterality: N/A;  . ARTERIOVENOUS GRAFT PLACEMENT Right  2009   forearm/notes 02/01/2011  . AV FISTULA PLACEMENT  11/07/2011   Procedure: INSERTION OF ARTERIOVENOUS (AV) GORE-TEX GRAFT ARM;  Surgeon: Tinnie Gens, MD;  Location: [place];  Service: Vascular;  Laterality: Left;  . BACK SURGERY  1998  . BALLOON DILATION N/A 11/14/2018   Procedure: BALLOON DILATION;  Surgeon: Ronnette Juniper, MD;  Location: [place];  Service: Gastroenterology;  Laterality: N/A;  . BASCILIC VEIN TRANSPOSITION Right 02/27/2013   Procedure: BASCILIC VEIN TRANSPOSITION;  Surgeon: Mal Misty, MD;  Location: [place];  Service: Vascular;  Laterality: Right;  Right Basilic Vein Transposition   . BILIARY DILATION  03/31/2019   Procedure: BILIARY DILATION;  Surgeon: Clarene Essex, MD;  Location: [facility] ENDOSCOPY;  Service: Endoscopy;;  . BILIARY DILATION  05/20/2019   Procedure: BILIARY DILATION;  Surgeon: Clarene Essex, MD;  Location: [facility] ENDOSCOPY;  Service: Endoscopy;;  . BILIARY STENT PLACEMENT  03/31/2019   Procedure: BILIARY STENT PLACEMENT;  Surgeon: Clarene Essex, MD;  Location: [facility] ENDOSCOPY;  Service: Endoscopy;;  . BILIARY STENT PLACEMENT N/A 05/20/2019   Procedure: BILIARY STENT PLACEMENT;  Surgeon: Clarene Essex, MD;  [facility]OSCOPY;  Service: Endoscopy;  Laterality: N/A;  . CARDIAC CATHETERIZATION N/A 08/06/2016   Procedure: Left Heart Cath and Coronary Angiography;  Surgeon: Jolaine Artist, MD;  Location: [place] CV [facility];  Service: Cardiovascular;  Laterality: N/A;  . CARDIAC CATHETERIZATION N/A 08/07/2016   Procedure: Coronary/Graft Atherectomy-CSI LAD;  Surgeon: Peter M Martinique, MD;  Location: [place] CV [facility];  Service: Cardiovascular;  Laterality: N/A;  . CERVICAL SPINE SURGERY  2/09   "to repair nerve problems in my left arm"  . CHOLECYSTECTOMY    . COLONOSCOPY WITH PROPOFOL N/A 04/26/2017   Procedure: COLONOSCOPY WITH PROPOFOL;  Surgeon: Otis Brace, MD;  Location: [place];  Service: Gastroenterology;  Laterality: N/A;  . CORONARY ANGIOPLASTY WITH STENT  PLACEMENT  06/11/2008  . CORONARY ANGIOPLASTY WITH STENT PLACEMENT  06/2007   TAXUS stent to RCA/notes 01/31/2011  . ENDOSCOPIC RETROGRADE CHOLANGIOPANCREATOGRAPHY (ERCP) WITH PROPOFOL N/A 03/31/2019   Procedure: ENDOSCOPIC RETROGRADE CHOLANGIOPANCREATOGRAPHY (ERCP) WITH PROPOFOL;  Surgeon: Clarene Essex, MD;  Location: [place];  Service: Endoscopy;  Laterality: N/A;  . ERCP N/A 11/14/2018   Procedure: ENDOSCOPIC RETROGRADE CHOLANGIOPANCREATOGRAPHY (ERCP);  Surgeon: Ronnette Juniper, MD;  Location: [place];  Service: Gastroenterology;  Laterality: N/A;  . ERCP N/A 05/20/2019   Procedure: ENDOSCOPIC RETROGRADE CHOLANGIOPANCREATOGRAPHY (ERCP);  Surgeon: Clarene Essex, MD;  Location[facility];  Service: Endoscopy;  Laterality: N/A;  . ESOPHAGOGASTRODUODENOSCOPY  09/28/2011   Procedure: ESOPHAGOGASTRODUODENOSCOPY (EGD);  Surgeon: Missy Sabins, MD;  Location: [facility] ENDOSCOPY;  Service: Endoscopy;  Laterality: N/A;  . ESOPHAGOGASTRODUODENOSCOPY N/A 04/07/2015   Procedure: ESOPHAGOGASTRODUODENOSCOPY (EGD);  Surgeon: Teena Irani, MD;  Location: [facility];  Service: Endoscopy;  Laterality: N/A;  . ESOPHAGOGASTRODUODENOSCOPY N/A 04/19/2015   Procedure: ESOPHAGOGASTRODUODENOSCOPY (EGD);  Surgeon: Arta Silence, MD;  Location: [facility] ENDOSCOPY;  Service: Endoscopy;  Laterality: N/A;  . ESOPHAGOGASTRODUODENOSCOPY  11/14/2018   Procedure: ESOPHAGOGASTRODUODENOSCOPY (EGD);  Surgeon: Ronnette Juniper, MD;  Location: [facility] ENDOSCOPY;  Service: Gastroenterology;;  . ESOPHAGOGASTRODUODENOSCOPY (EGD) WITH PROPOFOL N/A 06/13/2018  Procedure: ESOPHAGOGASTRODUODENOSCOPY (EGD) WITH PROPOFOL;  Surgeon: Wonda Horner, MD;  Location: [facility] ENDOSCOPY;  Service: Endoscopy;  Laterality: N/A;  . FLEXIBLE SIGMOIDOSCOPY N/A 05/21/2017   Procedure: Conni Elliot;  Surgeon: Clarene Essex, MD;  Location: [place];  Service: Endoscopy;  Laterality: N/A;  . FLEXIBLE SIGMOIDOSCOPY Left 07/02/2017   Procedure: FLEXIBLE SIGMOIDOSCOPY;   Surgeon: Laurence Spates, MD;  Location: [place];  Service: Endoscopy;  Laterality: Left;  . FOREIGN BODY REMOVAL  09/2003   via upper endoscopy/notes 02/12/2011  . GIVENS CAPSULE STUDY  09/30/2011   Procedure: GIVENS CAPSULE STUDY;  Surgeon: Jeryl Columbia, MD;  Location: [facility] ENDOSCOPY;  Service: Endoscopy;  Laterality: N/A;  . INSERTION OF DIALYSIS CATHETER Right 2014  . INSERTION OF DIALYSIS CATHETER Left 02/11/2013   Procedure: INSERTION OF DIALYSIS CATHETER;  Surgeon: Conrad Yorkville, MD;  Location: [place];  Service: Vascular;  Laterality: Left;  Ultrasound guided  . IR AV DIALY SHUNT INTRO NEEDLE/INTRACATH INITIAL W/PTA/IMG RIGHT Right 11/14/2018  . IR US GUIDE VASC ACCESS RIGHT  11/14/2018  . LUMBAR LAMINECTOMY/DECOMPRESSION MICRODISCECTOMY Bilateral 07/31/2017   Procedure: LAMINECTOMY AND FORAMINOTOMY- BILATERAL LUMBAR TWO- LUMBAR THREE;  Surgeon: Earnie Larsson, MD;  Location: [place];  Service: Neurosurgery;  Laterality: Bilateral;  LAMINECTOMY AND FORAMINOTOMY- BILATERAL LUMBAR 2- LUMBAR 3  . PERIPHERAL VASCULAR BALLOON ANGIOPLASTY  04/07/2018   Procedure: PERIPHERAL VASCULAR BALLOON ANGIOPLASTY;  Surgeon: Waynetta Sandy, MD;  Location: [place] [facility];  Service: Cardiovascular;;  RUE AVF  . REMOVAL OF A DIALYSIS CATHETER Right 02/11/2013   Procedure: REMOVAL OF A DIALYSIS CATHETER;  Surgeon: Conrad [place] Park, MD;  Location: [place];  Service: Vascular;  Laterality: Right;  . REMOVAL OF STONES  11/14/2018   Procedure: REMOVAL OF STONES;  Surgeon: Ronnette Juniper, MD;  Location: [facility] ENDOSCOPY;  Service: Gastroenterology;;  . REMOVAL OF STONES  03/31/2019   Procedure: REMOVAL OF STONES;  Surgeon: Clarene Essex, MD;  Location: [place];  Service: Endoscopy;;  . Azzie Almas DILATION N/A 04/07/2015   Procedure: Azzie Almas DILATION;  Surgeon: Teena Irani, MD;  [facility] ENDOS[facility];  Service: Endoscopy;  Laterality: N/A;  . SHUNTOGRAM N/A 09/20/2011   Procedure: Earney Mallet;  Surgeon: Conrad Celebration, MD;   Location: [facility] CATH LAB;  Service: Cardiovascular;  Laterality: N/A;  . SPHINCTEROTOMY  11/14/2018   Procedure: SPHINCTEROTOMY;  Surgeon: Ronnette Juniper, MD;  Location: [facility] ENDOSCOPY;  Service: Gastroenterology;;  . Joan Mayans  03/31/2019   Procedure: Joan Mayans;  Surgeon: Clarene Essex, MD;  Location: [facility] ENDOSCOPY;  Service: Endoscopy;;  . SPHINCTEROTOMY  05/20/2019   Procedure: Joan Mayans;  Surgeon: Clarene Essex, MD;  [facility];  Service: Endoscopy;;  . SPYGLASS CHOLANGIOSCOPY N/A 05/20/2019   Procedure: WYOVZCHY CHOLANGIOSCOPY;  Surgeon: Clarene Essex, MD;  [facility] END[facility];  Service: Endoscopy;  Laterality: N/A;  Bess Kinds LITHOTRIPSY N/A 05/20/2019   Procedure: IFOYDXAJ LITHOTRIPSY;  Surgeon: Clarene Essex, MD;  [facility];  Service: Endoscopy;  Laterality: N/A;  . STENT REMOVAL  05/20/2019   Procedure: STENT REMOVAL;  Surgeon: Clarene Essex, MD;  [facility]OS[facility];  Service: Endoscopy;;  . SVT ABLATION N/A 11/26/2016   Procedure: SVT Ablation;  Surgeon: Will Meredith Leeds, MD;  Location: [place] [facility];  Service: Cardiovascular;  Laterality: N/A;  . TEE WITHOUT CARDIOVERSION N/A 08/25/2018   Procedure: TRANSESOPHAGEAL ECHOCARDIOGRAM (TEE);  Surgeon: Dorothy Spark, MD;  Location: [place];  Service: Cardiovascular;  Laterality: N/A;  . TEE WITHOUT CARDIOVERSION N/A 04/02/2019   Procedure: TRANSESOPHAGEAL ECHOCARDIOGRAM (  TEE);  Surgeon: Josue Hector, MD;  Location: [facility] ENDOSCOPY;  Service: Cardiovascular;  Laterality: N/A;  . TONSILLECTOMY    . TOTAL KNEE ARTHROPLASTY Right 08/02/2015   Procedure: TOTAL KNEE ARTHROPLASTY;  Surgeon: Renette Butters, MD;  Location: [place];  Service: Orthopedics;  Laterality: Right;  . VENOGRAM N/A 01/26/2013   Procedure: VENOGRAM;  Surgeon: Angelia Mould, MD;  Location: [facility] CATH LAB;  Service: Cardiovascular;  Laterality: N/A;   Family History  Problem Relation Age of Onset  . Heart disease Sister   .  Thyroid nodules Sister   . Heart disease Father   . Diabetes Father   . Kidney failure Father   . Hypertension Father   . Healthy Child   . Healthy Child   . Healthy Child   . Cancer Neg Hx    Social History:  reports that he quit smoking about 13 years ago. His smoking use included cigarettes. He has a 25.00 pack-year smoking history. He quit smokeless tobacco use about 13 years ago. He reports that he does not drink alcohol or use drugs.  ROS: As per HPI otherwise negative.  Physical Exam: Vitals:   07/31/19 1830 07/31/19 1930 08/01/19 0100 08/01/19 0514  BP: (!) 101/53 122/60  (!) 126/54  Pulse:   77 76  Resp:  12 (!) 22   Temp:  99.3 F (37.4 C)  (!) 97.5 F (36.4 C)  TempSrc:  Oral  Oral  SpO2:  98% 98% 100%  Weight:      Height:         General: Well developed, well nourished male, in no acute distress. Head: Normocephalic, atraumatic, sclera non-icteric, mucus membranes are moist. Neck:  JVD not elevated. Lungs: Clear bilaterally to auscultation without wheezes, rales, or rhonchi. Breathing is unlabored. Heart: RRR with normal S1, S2. No murmurs, rubs, or gallops appreciated. Abdomen: Soft, non-distended with normoactive bowel sounds. Mild tenderness to deep palpation RUQ. No rebound/guarding. No obvious abdominal masses. Musculoskeletal:  Strength and tone appear normal for age. Lower extremities: No peripheral edema Neuro: Alert and oriented X 3. Moves all extremities spontaneously. Psych:  Responds to questions appropriately with a normal affect. Dialysis Access:RUE AVF + bruit and thrill, no erythema/warmth  Allergies  Allergen Reactions  . Statins Other (See Comments)    Weak muscles  . Ciprofloxacin Rash   Prior to Admission medications   Medication Sig Start Date End Date Taking? Authorizing Provider  acetaminophen (TYLENOL) 325 MG tablet Take 325 mg by mouth every 6 (six) hours as needed for mild pain or headache.    Yes [provider]   Alirocumab (PRALUENT) 150 MG/ML SOAJ Inject 1 pen into the skin every 14 (fourteen) days. 04/08/19  Yes Lorretta Harp, MD  allopurinol (ZYLOPRIM) 100 MG tablet Take 1 tablet by mouth once daily Patient taking differently: Take 100 mg by mouth at bedtime.  06/12/19  Yes Biagio Borg, MD  aspirin EC 325 MG tablet Take 1 tablet (325 mg total) by mouth daily. 10/10/18  Yes Hongalgi, Lenis Dickinson, MD  betamethasone dipropionate (DIPROLENE) 0.05 % cream Apply 1 application topically daily as needed (leg sores).   Yes [provider]  cinacalcet (SENSIPAR) 90 MG tablet Take 90 mg by mouth 2 (two) times daily.   Yes [provider]  colestipol (COLESTID) 1 g tablet Take 3 tablets (3 g total) by mouth daily. 02/18/19  Yes Renato Shin, MD  ezetimibe (ZETIA) 10 MG tablet Take 10 mg by  mouth at bedtime.    Yes [provider]  ferric citrate (AURYXIA) 1 GM 210 MG(Fe) tablet Take 420 mg by mouth 3 (three) times daily with meals.    Yes [provider]  gabapentin (NEURONTIN) 300 MG capsule TAKE 1 TO 2 CAPSULES BY MOUTH AT BEDTIME AS NEEDED FOR PAIN AND FOR SLEEP Patient taking differently: Take 600 mg by mouth at bedtime.  06/25/19  Yes Biagio Borg, MD  hydroxypropyl methylcellulose / hypromellose (ISOPTO TEARS / GONIOVISC) 2.5 % ophthalmic solution Place 1 drop into both eyes 3 (three) times daily as needed for dry eyes. 05/03/17  Yes Biagio Borg, MD  methimazole (TAPAZOLE) 5 MG tablet Take 1/2 (one-half) tablet by mouth once daily Patient taking differently: Take 2.5 mg by mouth daily.  07/13/19  Yes Renato Shin, MD  midodrine (PROAMATINE) 10 MG tablet Take 10 mg by mouth See admin instructions. Take 10mg  every Tuesday, Thursday and Saturday    Yes [provider]  multivitamin (RENA-VIT) TABS tablet Take 1 tablet by mouth daily.    Yes [provider]  pantoprazole (PROTONIX) 40 MG tablet Take 40 mg by mouth at bedtime.  11/03/18  Yes [provider]  polyethylene glycol (MIRALAX / GLYCOLAX) 17 g packet Take 17 g by mouth daily as needed for mild constipation. 04/02/19  Yes Aline August, MD  zolpidem (AMBIEN) 5 MG tablet Take 1 tablet (5 mg total) by mouth at bedtime as needed. for sleep Patient taking differently: Take 5 mg by mouth at bedtime as needed for sleep.  07/03/19  Yes Biagio Borg, MD  doxercalciferol ([name]) 4 MCG/2ML injection Inject 1 mL (2 mcg total) into the vein Every Tuesday,Thursday,and Saturday with dialysis. 09/02/18   Kayleen Memos, DO   Current Facility-Administered Medications  Medication Dose Route Frequency Provider Last Rate Last Dose  . acetaminophen (TYLENOL) tablet 650 mg  650 mg Oral Q6H PRN Alma Friendly, MD       Or  . acetaminophen (TYLENOL) suppository 650 mg  650 mg Rectal Q6H PRN Alma Friendly, MD      . albuterol (PROVENTIL) (2.5 MG/3ML) 0.083% nebulizer solution 2.5 mg  2.5 mg Nebulization Q2H PRN Alma Friendly, MD      . allopurinol (ZYLOPRIM) tablet 100 mg  100 mg Oral Daily Alma Friendly, MD   100 mg at 08/01/19 0930  . aspirin EC tablet 325 mg  325 mg Oral Daily Alma Friendly, MD   325 mg at 08/01/19 0930  . ceFEPIme (MAXIPIME) 1 g in sodium chloride 0.9 % 100 mL IVPB  1 g Intravenous Q24H Werner Lean, [facility]   Stopped at 07/31/19 1401  . Chlorhexidine Gluconate Cloth 2 % PADS 6 each  6 each Topical Q0600 Collins, Hervey Ard, PA-C      . ezetimibe (ZETIA) tablet 10 mg  10 mg Oral QPM Alma Friendly, MD   10 mg at 07/31/19 2202  . ferric citrate (AURYXIA) tablet 630 mg  630 mg Oral TID WC Alma Friendly, MD   630 mg at 08/01/19 0929  . heparin injection 5,000 Units  5,000 Units Subcutaneous Q8H Alma Friendly, MD   5,000 Units at 08/01/19 0549  . HYDROcodone-acetaminophen (NORCO/VICODIN) 5-325 MG per tablet 1-2 tablet  1-2 tablet Oral Q4H PRN Alma Friendly, MD   1 tablet at 08/01/19 0606  . hydroxypropyl methylcellulose / hypromellose  (ISOPTO TEARS / GONIOVISC) 2.5 % ophthalmic solution 1  drop  1 drop Both Eyes TID PRN Alma Friendly, MD      . insulin aspart (novoLOG) injection 0-9 Units  0-9 Units Subcutaneous Q4H Alma Friendly, MD   1 Units at 07/31/19 2209  . loperamide (IMODIUM) capsule 2 mg  2 mg Oral QID PRN Alma Friendly, MD      . methimazole (TAPAZOLE) tablet 2.5 mg  2.5 mg Oral Daily Alma Friendly, MD   2.5 mg at 08/01/19 0930  . metroNIDAZOLE (FLAGYL) IVPB 500 mg  500 mg Intravenous Q8H Alma Friendly, MD 100 mL/hr at 08/01/19 0559 500 mg at 08/01/19 0559  . multivitamin (RENA-VIT) tablet 1 tablet  1 tablet Oral Daily Alma Friendly, MD   1 tablet at 08/01/19 0930  . ondansetron (ZOFRAN) tablet 4 mg  4 mg Oral Q6H PRN Alma Friendly, MD       Or  . ondansetron [facility]) injection 4 mg  4 mg Intravenous Q6H PRN Alma Friendly, MD      . pantoprazole (PROTONIX) EC tablet 40 mg  40 mg Oral QHS Alma Friendly, MD   40 mg at 07/31/19 2202  . polyethylene glycol (MIRALAX / GLYCOLAX) packet 17 g  17 g Oral Daily PRN Alma Friendly, MD      . vancomycin variable dose per unstable renal function (pharmacist dosing)   Does not apply See admin instructions Alma Friendly, MD       Labs: Basic Metabolic Panel: Recent Labs  Lab 07/31/19 1213 07/31/19 1251 07/31/19 1722 08/01/19 0231  NA 138 136 139 140  K 5.7* 6.8* 5.7* 5.4*  CL 95* 107 98 101  CO2 25  --  25 25  GLUCOSE 148* 123* 127* 130*  BUN 41* 53* 46* 51*  CREATININE 8.24* 7.10* 8.48* 9.37*  CALCIUM 8.9  --  8.6* 8.7*   Liver Function Tests: Recent Labs  Lab 07/31/19 1213 08/01/19 0231  AST 40 25  ALT 63* 48*  ALKPHOS 147* 132*  BILITOT 1.1 0.9  PROT 7.3 6.8  ALBUMIN 3.2* 2.9*   Recent Labs  Lab 07/31/19 1213  LIPASE 33   CBC: Recent Labs  Lab 07/31/19 1213 07/31/19 1251 08/01/19 0231  WBC 10.0  --  8.9  NEUTROABS 8.1*  --   --   HGB 13.5 10.2* 10.1*  HCT 44.2 30.0*  32.1*  MCV 98.9  --  97.0  PLT 209  --  216   CBG: Recent Labs  Lab 07/31/19 1050 07/31/19 2153 08/01/19 0052 08/01/19 0512 08/01/19 0852  GLUCAP 130* 134* 115* 116* 96   Studies/Results: Ct Head Wo Contrast  Result Date: 07/31/2019 CLINICAL DATA:  Encephalopathy EXAM: CT HEAD WITHOUT CONTRAST TECHNIQUE: Contiguous axial images were obtained from the base of the skull through the vertex without intravenous contrast. COMPARISON:  10/04/2018 FINDINGS: Brain: Stable atrophy and chronic white matter microvascular ischemic changes throughout both cerebral hemispheres. No acute intracranial hemorrhage, new mass lesion, definite infarction, midline shift, herniation, hydrocephalus, or extra-axial fluid collection. No focal mass effect or edema. Cisterns are patent. Minor cerebellar atrophy. Vascular: No hyperdense vessel or unexpected calcification. Skull: Normal. Negative for fracture or focal lesion. Sinuses/Orbits: No acute finding. Other: None. IMPRESSION: Stable atrophy and chronic white matter microvascular ischemic changes. No interval change or acute process by noncontrast CT. Electronically Signed   By: Jerilynn Mages.  Shick M.D.   On: 07/31/2019 15:27   Dg Chest Portable 1 View  Result Date: 07/31/2019 CLINICAL  DATA:  Chest pain, fever, dialysis patient. EXAM: PORTABLE CHEST 1 VIEW COMPARISON:  07/03/2019 FINDINGS: Borderline enlarged cardiac silhouette. The aorta remains tortuous and calcified. Stable linear scarring in the left mid lung zone. Mildly prominent interstitial markings with improvement. Cervical spine fixation hardware. Right shoulder degenerative changes. IMPRESSION: 1. No acute abnormality. 2. Stable borderline cardiomegaly. 3. Mild chronic interstitial lung disease. Electronically Signed   By: Claudie Revering M.D.   On: 07/31/2019 11:49   Ct Renal Stone Study  Result Date: 07/31/2019 CLINICAL DATA:  Abdominal pain. EXAM: CT ABDOMEN AND PELVIS WITHOUT CONTRAST TECHNIQUE: Multidetector  CT imaging of the abdomen and pelvis was performed following the standard protocol without IV contrast. COMPARISON:  11/11/2018 FINDINGS: Lower chest: Minimal bilateral dependent atelectasis or scarring, without significant change. Stable 6 mm partially calcified subpleural nodule in the lateral aspect of the right upper lobe, anteriorly, on image number 7 series 5. Borderline enlarged heart. Atheromatous coronary artery calcifications. Hepatobiliary: Intrahepatic biliary air is again demonstrated with stable mild dilatation of the intrahepatic ducts. A biliary stent extending from the proximal common duct into the duodenum is currently demonstrated without the previously demonstrated extrahepatic biliary ductal dilatation. Cholecystectomy clips. Pancreas: Unremarkable. No pancreatic ductal dilatation or surrounding inflammatory changes. Spleen: Normal in size without focal abnormality. Adrenals/Urinary Tract: Normal appearing adrenal glands. The kidneys remains small, containing multiple cysts and vascular calcifications. No significant urine in the urinary bladder. No visible ureteral abnormalities. Stomach/Bowel: Stomach is within normal limits. Appendix appears normal. No evidence of bowel wall thickening, distention, or inflammatory changes. Vascular/Lymphatic: Atheromatous arterial calcifications without aneurysm. No enlarged lymph nodes. Reproductive: Prostate is unremarkable. Other: No abdominal wall hernia or abnormality. No abdominopelvic ascites. Musculoskeletal: Ray cages at the L3-4 level and solid interbody bone and pedicle screw and rod fusion at the L4-5 level. Lumbar and lower thoracic spine degenerative changes. IMPRESSION: No acute abnormality. Electronically Signed   By: Claudie Revering M.D.   On: 07/31/2019 15:35    Dialysis Orders: Center: [facility]  on TTS. RUE AVF; T 4hr 66min; BFR 450, DFR 800; EDW 1202kg; 3K/ 2.25 Ca  No heparin Mircera 60 mcg IV q 2 weeks (last dose 75 mcg on  06/09/2019) Venofer 100mg  q HD (stop date 08/06/2019) [name] 3 mcg q HD Midodrine 10mg  PO before HD Auryxia 210mg  2 tabs TID AC Sensipar 90 mg PO BID   Assessment/Plan: 1.  Sepsis: CXR unremarkable, CT abdomen shows no clear etiology. Ongoing abdominal tenderness and hx of gallstones. On vancomycin, flagyl and cefepime. Cultures pending. GI is following and planing ERCP on Monday, 08/03/19.  2. Encephalopathy/weakness: Resolved. Ct head unremarkable. Hyperkalemia likely contributed to weakness.  3. Hyperkalemia: K+ 6.8 on admission, now 5.4 after calcium gluconate and bicarb. Had recently been changed to [place] bath with HD, change back to 2K. If K+ remains elevated, consider evaluating fistula to rule out recirculation. Low K+ diet.  4.  ESRD:  Continue TTS schedule, will plan for HD today. K+ 5.4, use 2K bath (see above). No indication for emergent dialysis. 5.  Hypertension/volume: BP soft, on midodrine before dialysis as an outpatient, will restart here. No evidence of volume overload and poor PO intake. Doubt weight is accurate, will get weights post-HD to reassess.  6.  Anemia: Hgb 10.1.  Receiving course of venofer as an outpatient, hold for now due to sepsis. Start aranesp.   7.  Metabolic bone disease: Calcium 9.6 corrected. No phos reported yet. Continue hectorol, sensipar and [place].  8.  Nutrition:  Renal diet with fluid restrictions.   Anice Paganini, PA-C 08/01/2019, 10:53 AM  [facility] Pager: [phone]  Pt seen, examined and agree w A/P as above.  Kelly Splinter  MD 08/01/2019, 2:00 PM

## 2019-08-02 ENCOUNTER — Encounter (HOSPITAL_COMMUNITY): Payer: Self-pay

## 2019-08-02 DIAGNOSIS — G7281 Critical illness myopathy: Secondary | ICD-10-CM

## 2019-08-02 DIAGNOSIS — Z992 Dependence on renal dialysis: Secondary | ICD-10-CM | POA: Diagnosis not present

## 2019-08-02 DIAGNOSIS — N186 End stage renal disease: Secondary | ICD-10-CM | POA: Diagnosis not present

## 2019-08-02 DIAGNOSIS — E1129 Type 2 diabetes mellitus with other diabetic kidney complication: Secondary | ICD-10-CM | POA: Diagnosis not present

## 2019-08-02 LAB — BASIC METABOLIC PANEL
Anion gap: 15 (ref 5–15)
BUN: 24 mg/dL — ABNORMAL HIGH (ref 8–23)
CO2: 24 mmol/L (ref 22–32)
Calcium: 8 mg/dL — ABNORMAL LOW (ref 8.9–10.3)
Chloride: 99 mmol/L (ref 98–111)
Creatinine, Ser: 6.07 mg/dL — ABNORMAL HIGH (ref 0.61–1.24)
GFR calc Af Amer: 10 mL/min — ABNORMAL LOW (ref >60)
GFR calc non Af Amer: 9 mL/min — ABNORMAL LOW (ref >60)
Glucose, Bld: 74 mg/dL (ref 70–99)
Potassium: 4.1 mmol/L (ref 3.5–5.1)
Sodium: 138 mmol/L (ref 135–145)

## 2019-08-02 LAB — GLUCOSE, CAPILLARY
Glucose-Capillary: 70 mg/dL (ref 70–99)
Glucose-Capillary: 86 mg/dL (ref 70–99)
Glucose-Capillary: 85 mg/dL (ref 70–99)
Glucose-Capillary: 120 mg/dL — ABNORMAL HIGH (ref 70–99)

## 2019-08-02 MED ORDER — GABAPENTIN 300 MG PO CAPS
300.0000 mg | ORAL_CAPSULE | Freq: Two times a day (BID) | ORAL | Status: DC
  Administered 2019-08-02 – 2019-08-05 (×6): 300 mg via ORAL
  Filled 2019-08-02 (×6): qty 1

## 2019-08-02 MED ORDER — GABAPENTIN 300 MG PO CAPS
300.0000 mg | ORAL_CAPSULE | Freq: Three times a day (TID) | ORAL | Status: DC
  Administered 2019-08-02: 300 mg via ORAL
  Filled 2019-08-02: qty 1

## 2019-08-02 NOTE — Progress Notes (Signed)
Overnight pt refused EKG, CBG's and vitals check.  I visually checked on pt during report and he stayed asleep and did not roll over or respond to "good morning, Hello". Resp even and unlabored, Cpap from home is on.

## 2019-08-02 NOTE — Progress Notes (Signed)
Pt has home CPAP at bedside and states he does not need any assistance placing it on.

## 2019-08-02 NOTE — Evaluation (Signed)
Physical Therapy Evaluation Patient Details Name: [name] MRN: [id number] DOB: June 27, 1951 Today's Date: 08/02/2019   History of Present Illness    68 y.o. male with a PMH including ESRD on hemodialysis, cholecystectomy and recurrent gallstones, CAD, CHF, T2DM, who presented to the ED on 07/31/2019 with weakness and confusion. COVID-19 was negative, no acute infiltrate on chest x-ray.  Patient started on IV antibiotics admitted for early sepsis.   Clinical Impression  Pt presents at/near baseline functional level, ambulating with RW in room, but does endorse weakness he attributes to medical issues.  Reluctant to participate in PT in hospital, but does receive HHPT and progresses strength and function with that service and would like to continue at d/c.  No acute needs, encourage OOB in chair and walk to bathroom to maintain function in anticipation of home d/c.      Follow Up Recommendations Home health PT(resume services)    Equipment Recommendations  None recommended by PT    Recommendations for Other Services       Precautions / Restrictions        Mobility  Bed Mobility Overal bed mobility: Independent                Transfers Overall transfer level: Modified independent               General transfer comment: uses RW for ambulation, transfers to RW safely  Ambulation/Gait Ambulation/Gait assistance: Modified independent (Device/Increase time) Gait Distance (Feet): 25 Feet Assistive device: Rolling walker (2 wheeled) Gait Pattern/deviations: Step-through pattern     General Gait Details: return to bed from bathroom, no issue or problems noted, pt denies deficits other than weakness attributed to medical issues  Stairs            Wheelchair Mobility    Modified Rankin (Stroke Patients Only)       Balance Overall balance assessment: No apparent balance deficits (not formally assessed)                                            Pertinent Vitals/Pain Pain Assessment: No/denies pain    Home Living Family/patient expects to be discharged to:: Private residence Living Arrangements: Spouse/significant other Available Help at Discharge: Family;Available 24 hours/day Type of Home: House Home Access: Stairs to enter Entrance Stairs-Rails: Right Entrance Stairs-Number of Steps: 3 Home Layout: One level Home Equipment: Wheelchair - [employer] - standard      Prior Function Level of Independence: Independent with assistive device(s)         Comments: continues to need occasional assist if fatigued and/or after HD, but generally very independent; has battled with recurrance of health problems, working with HHPT to address     Hand Dominance   Dominant Hand: Left    Extremity/Trunk Assessment   Upper Extremity Assessment Upper Extremity Assessment: Overall WFL for tasks assessed    Lower Extremity Assessment Lower Extremity Assessment: Overall WFL for tasks assessed       Communication   Communication: No difficulties  Cognition Arousal/Alertness: Awake/alert Behavior During Therapy: WFL for tasks assessed/performed Overall Cognitive Status: Within Functional Limits for tasks assessed                                        General Comments General  comments (skin integrity, edema, etc.): discussed care pt has recieved, pt rating [facility] "F" due to bad experience at ED (long wait) and did not get neurotin last pm when he asked.  Service recovery attempted with empathic listening and repeated offers to 'improve that grade'  Pt recalcitrant but amenable    Exercises     Assessment/Plan    PT Assessment All further PT needs can be met in the next venue of care  PT Problem List Decreased strength;Decreased activity tolerance;Decreased mobility;Impaired sensation       PT Treatment Interventions      PT Goals (Current goals can be found in the Care Plan section)   Acute Rehab PT Goals PT Goal Formulation: All assessment and education complete, DC therapy    Frequency     Barriers to discharge        Co-evaluation               AM-PAC PT "6 Clicks" Mobility  Outcome Measure Help needed turning from your back to your side while in a flat bed without using bedrails?: None Help needed moving from lying on your back to sitting on the side of a flat bed without using bedrails?: None Help needed moving to and from a bed to a chair (including a wheelchair)?: None Help needed standing up from a chair using your arms (e.g., wheelchair or bedside chair)?: None Help needed to walk in hospital room?: None Help needed climbing 3-5 steps with a railing? : None 6 Click Score: 24    End of Session   Activity Tolerance: Patient tolerated treatment well Patient left: in bed;with call bell/phone within reach Nurse Communication: Mobility status PT Visit Diagnosis: Difficulty in walking, not elsewhere classified (R26.2)    Time: 2595-6387 PT Time Calculation (min) (ACUTE ONLY): 35 min   Charges:   PT Evaluation $PT Eval Low Complexity: 1 Low          Kearney Hard, PT, DPT, MS Board Certified Geriatric Clinical Specialist   [name] 08/02/2019, 12:57 PM

## 2019-08-02 NOTE — Progress Notes (Addendum)
[facility] Progress Note   Subjective:   Pt seen and examined at bed side. Reports he had a bad night due to neuropathy. States he usually takes 600 mg of gabapentin daily and reports he was not given enough last night. He did get 200 mg this AM and is feeling better. Denies SOB, orthopnea, CP, palpitations, abdominal pain, N/V/D.   Objective Vitals:   08/01/19 1630 08/01/19 1657 08/01/19 1742 08/01/19 2151  BP: 113/67 119/69 124/60 (!) 154/64  Pulse: 80 78 78 84  Resp: 12 18 19 16   Temp:  98.6 F (37 C)  (!) 97.5 F (36.4 C)  TempSrc:  Oral  Oral  SpO2:  96% 100% 100%  Weight:  103.3 kg    Height:       Physical Exam General: Well developed, alert male in NAD Heart: RRR, no murmurs, rubs or gallops Lungs: CTA bilaterally without wheezing, rhonchi or rales Abdomen: Soft, non-tender, non-distended. +BS Extremities: No peripheral edema b/l lower extremities Dialysis Access: RUE AVF + thrill/bruit  Additional Objective Labs: Basic Metabolic Panel: Recent Labs  Lab 07/31/19 1722 08/01/19 0231 08/02/19 0720  NA 139 140 138  K 5.7* 5.4* 4.1  CL 98 101 99  CO2 25 25 24   GLUCOSE 127* 130* 74  BUN 46* 51* 24*  CREATININE 8.48* 9.37* 6.07*  CALCIUM 8.6* 8.7* 8.0*   Liver Function Tests: Recent Labs  Lab 07/31/19 1213 08/01/19 0231  AST 40 25  ALT 63* 48*  ALKPHOS 147* 132*  BILITOT 1.1 0.9  PROT 7.3 6.8  ALBUMIN 3.2* 2.9*   Recent Labs  Lab 07/31/19 1213  LIPASE 33   CBC: Recent Labs  Lab 07/31/19 1213 07/31/19 1251 08/01/19 0231  WBC 10.0  --  8.9  NEUTROABS 8.1*  --   --   HGB 13.5 10.2* 10.1*  HCT 44.2 30.0* 32.1*  MCV 98.9  --  97.0  PLT 209  --  216   Blood Culture    Component Value Date/Time   SDES BLOOD LEFT ANTECUBITAL 07/31/2019 1213   SDES BLOOD LEFT ANTECUBITAL 07/31/2019 1213   SPECREQUEST AEROBIC BOTTLE ONLY Blood Culture adequate volume 07/31/2019 1213   SPECREQUEST  07/31/2019 1213    BOTTLES DRAWN AEROBIC AND  ANAEROBIC Blood Culture adequate volume   CULT  07/31/2019 1213    NO GROWTH 2 DAYS Performed at [facility], [place] [address], [place], [place] [address]    CULT  07/31/2019 1213    NO GROWTH 2 DAYS Performed at [facility], [place] [address], [place], [place] [address]    REPTSTATUS PENDING 07/31/2019 1213   REPTSTATUS PENDING 07/31/2019 1213    CBG: Recent Labs  Lab 08/01/19 1139 08/01/19 1807 08/01/19 2051 08/02/19 0734 08/02/19 1216  GLUCAP 92 81 116* 70 86    Studies/Results: Ct Head Wo Contrast  Result Date: 07/31/2019 CLINICAL DATA:  Encephalopathy EXAM: CT HEAD WITHOUT CONTRAST TECHNIQUE: Contiguous axial images were obtained from the base of the skull through the vertex without intravenous contrast. COMPARISON:  10/04/2018 FINDINGS: Brain: Stable atrophy and chronic white matter microvascular ischemic changes throughout both cerebral hemispheres. No acute intracranial hemorrhage, new mass lesion, definite infarction, midline shift, herniation, hydrocephalus, or extra-axial fluid collection. No focal mass effect or edema. Cisterns are patent. Minor cerebellar atrophy. Vascular: No hyperdense vessel or unexpected calcification. Skull: Normal. Negative for fracture or focal lesion. Sinuses/Orbits: No acute finding. Other: None. IMPRESSION: Stable atrophy and chronic white matter microvascular ischemic  changes. No interval change or acute process by noncontrast CT. Electronically Signed   By: Jerilynn Mages.  Shick M.D.   On: 07/31/2019 15:27   Ct Renal Stone Study  Result Date: 07/31/2019 CLINICAL DATA:  Abdominal pain. EXAM: CT ABDOMEN AND PELVIS WITHOUT CONTRAST TECHNIQUE: Multidetector CT imaging of the abdomen and pelvis was performed following the standard protocol without IV contrast. COMPARISON:  11/11/2018 FINDINGS: Lower chest: Minimal bilateral dependent atelectasis or scarring, without significant change. Stable 6 mm partially calcified subpleural nodule in the lateral  aspect of the right upper lobe, anteriorly, on image number 7 series 5. Borderline enlarged heart. Atheromatous coronary artery calcifications. Hepatobiliary: Intrahepatic biliary air is again demonstrated with stable mild dilatation of the intrahepatic ducts. A biliary stent extending from the proximal common duct into the duodenum is currently demonstrated without the previously demonstrated extrahepatic biliary ductal dilatation. Cholecystectomy clips. Pancreas: Unremarkable. No pancreatic ductal dilatation or surrounding inflammatory changes. Spleen: Normal in size without focal abnormality. Adrenals/Urinary Tract: Normal appearing adrenal glands. The kidneys remains small, containing multiple cysts and vascular calcifications. No significant urine in the urinary bladder. No visible ureteral abnormalities. Stomach/Bowel: Stomach is within normal limits. Appendix appears normal. No evidence of bowel wall thickening, distention, or inflammatory changes. Vascular/Lymphatic: Atheromatous arterial calcifications without aneurysm. No enlarged lymph nodes. Reproductive: Prostate is unremarkable. Other: No abdominal wall hernia or abnormality. No abdominopelvic ascites. Musculoskeletal: Ray cages at the L3-4 level and solid interbody bone and pedicle screw and rod fusion at the L4-5 level. Lumbar and lower thoracic spine degenerative changes. IMPRESSION: No acute abnormality. Electronically Signed   By: Claudie Revering M.D.   On: 07/31/2019 15:35   Medications: . ceFEPime (MAXIPIME) IV 1 g (08/02/19 1112)  . metronidazole 500 mg (08/02/19 1884)   . allopurinol  100 mg Oral Daily  . aspirin EC  325 mg Oral Daily  . cinacalcet  90 mg Oral Q supper  . darbepoetin (ARANESP) injection - DIALYSIS  40 mcg Intravenous Q Sat-HD  . doxercalciferol  3 mcg Intravenous Q T,Th,Sa-HD  . ezetimibe  10 mg Oral QPM  . ferric citrate  630 mg Oral TID WC  . gabapentin  300 mg Oral TID  . heparin  5,000 Units Subcutaneous Q8H  .  insulin aspart  0-9 Units Subcutaneous Q4H  . methimazole  2.5 mg Oral Daily  . midodrine  10 mg Oral Q T,Th,Sa-HD  . multivitamin  1 tablet Oral Daily  . pantoprazole  40 mg Oral QHS    Dialysis Orders: Center: [facility]  on TTS. RUE AVF; T 4hr 14min; BFR 450, DFR 800; EDW 102kg; 3K/ 2.25 Ca  No heparin Mircera 60 mcg IV q 2 weeks (last dose 75 mcg on 06/09/2019) Venofer 100mg  q HD (stop date 08/06/2019) [name] 3 mcg q HD Midodrine 10mg  PO before HD Auryxia 210mg  2 tabs TID AC Sensipar 90 mg PO BID  Assessment/Plan: 1.  Sepsis: CXR unremarkable, CT abdomen shows no clear etiology. Ongoing abdominal tenderness and hx of gallstones. On vancomycin, flagyl and cefepime. Cultures pending. GI is following and planing ERCP on Monday, 08/03/19.  2. Neuropathy: Patient reports taking gabapentin 600mg  daily as outpatient and tolerates this well. Discussed with Dr. Jonnie Finner, recommend max daily dose of 600mg  as he is a dialysis patient. Was given 200mg  this AM. Will change to 300 mg BID, recommend giving after dialysis on dialysis days.  3. Encephalopathy/weakness: Resolved. Ct head unremarkable. Hyperkalemia likely contributed to weakness.  4. Hyperkalemia: K+ 6.8 on admission, now  4.1 after dialysis. Had recently been changed to [place] bath with HD, change back to 2K. If K+ remains elevated, consider evaluating fistula to rule out recirculation. Low K+ diet.  5.  ESRD:  Continue TTS schedule, will plan for next HD on 08/04/2019.  6.  Hypertension/volume: BP stable, slightly elevated this AM. on midodrine pre-HD. No evidence of volume overload. UF with HD as tolerated. 7.  Anemia: Hgb 10.1.  Receiving course of venofer as an outpatient, hold for now due to sepsis. Start aranesp.   8.  Metabolic bone disease: Calcium 8.9 corrected. No phos reported yet. Continue hectorol, sensipar and [place].  9.  Nutrition:  Renal diet with fluid restrictions.   Anice Paganini, PA-C 08/02/2019, 12:22 PM  [facility] Pager: [phone]  Pt seen, examined and agree w A/P as above. Doing better w/ HD and IV abx. MS better.  Kelly Splinter  MD 08/02/2019, 12:48 PM

## 2019-08-02 NOTE — Progress Notes (Addendum)
  Patient has been refusing medications and CBG checks despite being educated on their importance.  Patient says that "he is in pain and just wants to sleep."  He has refused gabapentin multiple times.

## 2019-08-02 NOTE — Progress Notes (Signed)
[facility]                                                                              Patient Demographics  [name], is a 68 y.o. male, DOB - 14-Jan-1951, CLE:[id number]  Admit date - 07/31/2019   Admitting Physician Alma Friendly, MD  Outpatient Primary MD for the patient is Biagio Borg, MD  Outpatient specialists:   LOS - 2  days    No chief complaint on file.      Brief summary   [name] is a 68 y.o. male with medical history significant for  but not limited to dementia, diabetes mellitus type 2, ESRD on HD T/TH/S, CAD, CHF, presenting with few days history of generalized weakness and mental status change associated with fever of 100.4, mild tachycardia, hypotensive. COVID-19 was negative, no acute infiltrate on chest x-ray.  Patient started on IV antibiotics admitted for early sepsis.  Assessment & Plan    Principal Problem:   Sepsis ([place]) Active Problems:   Gout   Dementia ([place])   ESRD on hemodialysis ([place])   Essential hypertension  Sepsis Presents with tachycardia, hypotension, elevated lactic acid level Source unclear, high risk for cholangitis given history of multiple gallstones with multiple ERCP Follow-up blood cultures Continue broad-spectrum IV antibiotics Continue IV fluids Seen by GI, ERCP plan for Monday, 08/02/2019; allow clears  Metabolic encephalopathy, acute CT head negative Improving, follow-up   End-stage renal disease Routine hemodialysis Monitor electrolytes and renal function per nephrology    Chronic systolic/diastolic HF Appears euvolemic TEE done on 7/20 showed EF of 60 to 65% Volume status managed with HD No longer on ACE/ARB's, BB due to low BP with ESRD  CAD status post stent to RCA in 2008, recurrent NSTEMI Chest pain free Continue aspirin No longer on Plavix due to rectal bleeding from possible radiation proctitis Continue Zetia, [facility]-9  Diabetes mellitus type 2 Last A1c on  05/2019 was 6 Diet-controlled SSI, Accu-Cheks, hypoglycemic protocol  Hyperthyroidism TSH, free T4 pending Continue methimazole  COPD On O2 at night  OSA on CPAP CPAP ordered QHS      Code Status: Full code DVT Prophylaxis:   heparin  Family Communication: Discussed in detail with the patient, all imaging results, lab results explained to the patient  Disposition Plan: to be determined  Time Spent in minutes 35 minutes  Procedures:    Consultants:   GI Nephrology  Antimicrobials:      Medications  Scheduled Meds:  allopurinol  100 mg Oral Daily   aspirin EC  325 mg Oral Daily   cinacalcet  90 mg Oral Q supper   darbepoetin (ARANESP) injection - DIALYSIS  40 mcg Intravenous Q Sat-HD   doxercalciferol  3 mcg Intravenous Q T,Th,Sa-HD   ezetimibe  10 mg Oral QPM   ferric citrate  630 mg Oral TID WC   gabapentin  300 mg Oral TID   heparin  5,000 Units Subcutaneous Q8H   insulin aspart  0-9 Units Subcutaneous Q4H   methimazole  2.5 mg Oral Daily   midodrine  10 mg Oral Q T,Th,Sa-HD   multivitamin  1 tablet Oral Daily   pantoprazole  40 mg Oral QHS   Continuous Infusions:  ceFEPime (MAXIPIME) IV 1 g (08/02/19 1112)   metronidazole 500 mg (08/02/19 0658)   PRN Meds:.acetaminophen **OR** acetaminophen, albuterol, diphenhydrAMINE, HYDROcodone-acetaminophen, hydroxypropyl methylcellulose / hypromellose, loperamide, ondansetron **OR** ondansetron (ZOFRAN) IV, polyethylene glycol   Antibiotics   Anti-infectives (From admission, onward)   Start     Dose/Rate Route Frequency Ordered Stop   08/01/19 0600  ceFEPIme (MAXIPIME) 1 g in sodium chloride 0.9 % 100 mL IVPB  Status:  Discontinued     1 g 200 mL/hr over 30 Minutes Intravenous Every 8 hours 07/31/19 1738 07/31/19 2038   08/01/19 0000  vancomycin (VANCOCIN) 2,500 mg in sodium chloride 0.9 % 500 mL IVPB  Status:  Discontinued     2,500 mg 250 mL/hr over 120 Minutes Intravenous Every 24  hours 07/31/19 1738 07/31/19 1742   07/31/19 2200  metroNIDAZOLE (FLAGYL) IVPB 500 mg     500 mg 100 mL/hr over 60 Minutes Intravenous Every 8 hours 07/31/19 1737     07/31/19 1745  vancomycin (VANCOCIN) 2,500 mg in sodium chloride 0.9 % 500 mL IVPB  Status:  Discontinued     2,500 mg 250 mL/hr over 120 Minutes Intravenous Every 24 hours 07/31/19 1737 07/31/19 1738   07/31/19 1745  ceFEPIme (MAXIPIME) 1 g in sodium chloride 0.9 % 100 mL IVPB  Status:  Discontinued     1 g 200 mL/hr over 30 Minutes Intravenous Every 8 hours 07/31/19 1737 07/31/19 1738   07/31/19 1741  vancomycin variable dose per unstable renal function (pharmacist dosing)  Status:  Discontinued      Does not apply See admin instructions 07/31/19 1742 08/01/19 1349   07/31/19 1345  ceFEPIme (MAXIPIME) 1 g in sodium chloride 0.9 % 100 mL IVPB     1 g 200 mL/hr over 30 Minutes Intravenous  Once 07/31/19 1332 07/31/19 1445   07/31/19 1130  ceFEPIme (MAXIPIME) 2 g in sodium chloride 0.9 % 100 mL IVPB  Status:  Discontinued     2 g 200 mL/hr over 30 Minutes Intravenous  Once 07/31/19 1118 07/31/19 1123   07/31/19 1130  metroNIDAZOLE (FLAGYL) IVPB 500 mg  Status:  Discontinued     500 mg 100 mL/hr over 60 Minutes Intravenous  Once 07/31/19 1118 07/31/19 1737   07/31/19 1130  vancomycin (VANCOCIN) IVPB 1000 mg/200 mL premix  Status:  Discontinued     1,000 mg 200 mL/hr over 60 Minutes Intravenous  Once 07/31/19 1118 07/31/19 1121   07/31/19 1130  vancomycin (VANCOCIN) 2,500 mg in sodium chloride 0.9 % 500 mL IVPB     2,500 mg 250 mL/hr over 120 Minutes Intravenous  Once 07/31/19 1121 07/31/19 1840   07/31/19 1130  ceFEPIme (MAXIPIME) 1 g in sodium chloride 0.9 % 100 mL IVPB     1 g 200 mL/hr over 30 Minutes Intravenous Every 24 hours 07/31/19 1123          Subjective:   Kainalu Heggs was seen and examined today.  No acute events overnight.  No fever chills  Objective:   Vitals:   08/01/19 1630 08/01/19 1657 08/01/19  1742 08/01/19 2151  BP: 113/67 119/69 124/60 (!) 154/64  Pulse: 80 78 78 84  Resp: 12 18 19 16   Temp:  98.6 F (37 C)  (!) 97.5 F (36.4 C)  TempSrc:  Oral  Oral  SpO2:  96% 100% 100%  Weight:  103.3 kg  Height:        Intake/Output Summary (Last 24 hours) at 08/02/2019 1136 Last data filed at 08/02/2019 0800 Gross per 24 hour  Intake 440 ml  Output 1500 ml  Net -1060 ml     Wt Readings from Last 3 Encounters:  08/01/19 103.3 kg  07/08/19 102.9 kg  07/03/19 102.5 kg     Exam  General: NAD  HEENT: NCAT,  PERRL,MMM  Neck: SUPPLE, (-) JVD  Cardiovascular: RRR, (-) GALLOP, (-) MURMUR  Respiratory: CTA  Gastrointestinal: SOFT, (-) DISTENSION, BS(+), (+) mild RUQ TENDERNESS  Ext: (-) CYANOSIS, (-) EDEMA  Neuro: A, OX 3  Skin:(-) RASH  Psych:NORMAL AFFECT/MOOD   Data Reviewed:  I have personally reviewed following labs and imaging studies  Micro Results Recent Results (from the past 240 hour(s))  Blood Culture (routine x 2)     Status: None (Preliminary result)   Collection Time: 07/31/19 12:13 PM   Specimen: BLOOD  Result Value Ref Range Status   Specimen Description BLOOD LEFT ANTECUBITAL  Final   Special Requests AEROBIC BOTTLE ONLY Blood Culture adequate volume  Final   Culture   Final    NO GROWTH 2 DAYS Performed at [facility], [address], [place], [place] [address]    Report Status PENDING  Incomplete  Blood Culture (routine x 2)     Status: None (Preliminary result)   Collection Time: 07/31/19 12:13 PM   Specimen: BLOOD  Result Value Ref Range Status   Specimen Description BLOOD LEFT ANTECUBITAL  Final   Special Requests   Final    BOTTLES DRAWN AEROBIC AND ANAEROBIC Blood Culture adequate volume   Culture   Final    NO GROWTH 2 DAYS Performed at [facility], [place] [address], [place], [place] [address]    Report Status PENDING  Incomplete  SARS Coronavirus 2 by RT PCR (hospital order, performed in [facility] hospital  lab) Nasopharyngeal Nasopharyngeal Swab     Status: None   Collection Time: 07/31/19  1:25 PM   Specimen: Nasopharyngeal Swab  Result Value Ref Range Status   SARS Coronavirus 2 NEGATIVE NEGATIVE Final    Comment: (NOTE) If result is NEGATIVE SARS-CoV-2 target nucleic acids are NOT DETECTED. The SARS-CoV-2 RNA is generally detectable in upper and lower  respiratory specimens during the acute phase of infection. The lowest  concentration of SARS-CoV-2 viral copies this assay can detect is 250  copies / mL. A negative result does not preclude SARS-CoV-2 infection  and should not be used as the sole basis for treatment or other  patient management decisions.  A negative result may occur with  improper specimen collection / handling, submission of specimen other  than nasopharyngeal swab, presence of viral mutation(s) within the  areas targeted by this assay, and inadequate number of viral copies  (<250 copies / mL). A negative result must be combined with clinical  observations, patient history, and epidemiological information. If result is POSITIVE SARS-CoV-2 target nucleic acids are DETECTED. The SARS-CoV-2 RNA is generally detectable in upper and lower  respiratory specimens dur ing the acute phase of infection.  Positive  results are indicative of active infection with SARS-CoV-2.  Clinical  correlation with patient history and other diagnostic information is  necessary to determine patient infection status.  Positive results do  not rule out bacterial infection or co-infection with other viruses. If result is PRESUMPTIVE POSTIVE SARS-CoV-2 nucleic acids MAY BE PRESENT.   A presumptive positive  result was obtained on the submitted specimen  and confirmed on repeat testing.  While 2019 novel coronavirus  (SARS-CoV-2) nucleic acids may be present in the submitted sample  additional confirmatory testing may be necessary for epidemiological  and / or clinical management purposes  to  differentiate between  SARS-CoV-2 and other Sarbecovirus currently known to infect humans.  If clinically indicated additional testing with an alternate test  methodology [phone]) is advised. The SARS-CoV-2 RNA is generally  detectable in upper and lower respiratory sp ecimens during the acute  phase of infection. The expected result is Negative. Fact Sheet for Patients:  StrictlyIdeas.no Fact Sheet for Healthcare Providers: BankingDealers.co.za This test is not yet approved or cleared by the [place] FDA and has been authorized for detection and/or diagnosis of SARS-CoV-2 by FDA under an Emergency Use Authorization (EUA).  This EUA will remain in effect (meaning this test can be used) for the duration of the COVID-19 declaration under Section 564(b)(1) of the Act, 21 U.S.C. section 360bbb-3(b)(1), unless the authorization is terminated or revoked sooner. Performed at [name], [place] [address] [place], [place] [address]     Radiology Reports Dg Chest 2 View  Result Date: 07/03/2019 CLINICAL DATA:  Abnormal EKG, weakness and decreased stamina for 1 week, nonradiating generalized dull chest pain, shortness of breath with exertion, history end-stage renal disease on dialysis, diabetes mellitus, hypertension EXAM: CHEST - 2 VIEW COMPARISON:  03/26/2019 FINDINGS: Normal heart size and pulmonary vascularity. Atherosclerotic calcification aorta. Mediastinal contours otherwise normal. Mild bibasilar atelectasis. No infiltrate, pleural effusion or pneumothorax. Prior cervical spine fusion. Vascular stents noted in the upper RIGHT arm. Scattered degenerative disc disease changes thoracic spine. IMPRESSION: Bibasilar atelectasis. No acute abnormalities. Electronically Signed   By: Lavonia Dana M.D.   On: 07/03/2019 15:59   Ct Head Wo Contrast  Result Date: 07/31/2019 CLINICAL DATA:  Encephalopathy EXAM: CT HEAD WITHOUT CONTRAST  TECHNIQUE: Contiguous axial images were obtained from the base of the skull through the vertex without intravenous contrast. COMPARISON:  10/04/2018 FINDINGS: Brain: Stable atrophy and chronic white matter microvascular ischemic changes throughout both cerebral hemispheres. No acute intracranial hemorrhage, new mass lesion, definite infarction, midline shift, herniation, hydrocephalus, or extra-axial fluid collection. No focal mass effect or edema. Cisterns are patent. Minor cerebellar atrophy. Vascular: No hyperdense vessel or unexpected calcification. Skull: Normal. Negative for fracture or focal lesion. Sinuses/Orbits: No acute finding. Other: None. IMPRESSION: Stable atrophy and chronic white matter microvascular ischemic changes. No interval change or acute process by noncontrast CT. Electronically Signed   By: Jerilynn Mages.  Shick M.D.   On: 07/31/2019 15:27   Dg Chest Portable 1 View  Result Date: 07/31/2019 CLINICAL DATA:  Chest pain, fever, dialysis patient. EXAM: PORTABLE CHEST 1 VIEW COMPARISON:  07/03/2019 FINDINGS: Borderline enlarged cardiac silhouette. The aorta remains tortuous and calcified. Stable linear scarring in the left mid lung zone. Mildly prominent interstitial markings with improvement. Cervical spine fixation hardware. Right shoulder degenerative changes. IMPRESSION: 1. No acute abnormality. 2. Stable borderline cardiomegaly. 3. Mild chronic interstitial lung disease. Electronically Signed   By: Claudie Revering M.D.   On: 07/31/2019 11:49   Vas [place] Burnard Bunting With/wo Tbi  Result Date: 07/08/2019 LOWER EXTREMITY DOPPLER STUDY Indications: Claudication, ulceration, and peripheral artery disease. High Risk Factors: Hypertension, hyperlipidemia, Diabetes, past history of                    smoking, prior MI, coronary artery disease.  Performing Technologist: Burley Saver  RVT  Examination Guidelines: A complete evaluation includes at minimum, Doppler waveform signals and systolic blood pressure reading at  the level of bilateral brachial, anterior tibial, and posterior tibial arteries, when vessel segments are accessible. Bilateral testing is considered an integral part of a complete examination. Photoelectric Plethysmograph (PPG) waveforms and toe systolic pressure readings are included as required and additional duplex testing as needed. Limited examinations for reoccurring indications may be performed as noted.  ABI Findings: +---------+------------------+-----+-------------------+--------+  Right     Rt Pressure (mmHg) Index Waveform            Comment   +---------+------------------+-----+-------------------+--------+  Brachial                                               AVF       +---------+------------------+-----+-------------------+--------+  PTA       69                 0.51  dampened monophasic           +---------+------------------+-----+-------------------+--------+  DP        66                 0.49  dampened monophasic           +---------+------------------+-----+-------------------+--------+  Great Toe 44                 0.32  Abnormal                      +---------+------------------+-----+-------------------+--------+ +---------+------------------+-----+-------------------+-------+  Left      Lt Pressure (mmHg) Index Waveform            Comment  +---------+------------------+-----+-------------------+-------+  Brachial  136                                                   +---------+------------------+-----+-------------------+-------+  PTA       51                 0.38  dampened monophasic          +---------+------------------+-----+-------------------+-------+  DP        61                 0.45  dampened monophasic          +---------+------------------+-----+-------------------+-------+  Great Toe 26                 0.19  Abnormal                     +---------+------------------+-----+-------------------+-------+ +-------+-----------+-----------+------------+------------+  ABI/TBI Today's  ABI Today's TBI Previous ABI Previous TBI  +-------+-----------+-----------+------------+------------+  Right   0.51        0.32        0.78         0.61          +-------+-----------+-----------+------------+------------+  Left    0.45        0.19        0.50         0.40          +-------+-----------+-----------+------------+------------+ Right ABIs and TBIs appear decreased compared to prior study on 05/07/2018.  Left TBIs appear decreased compared to prior study on 05/07/2018. Left ABIs appear slightly decreased when compared to previous study.  Summary: Right: Resting right ankle-brachial index indicates moderate right lower extremity arterial disease. The right toe-brachial index is abnormal. RT great toe pressure = 44 mmHg. PPG tracings appear dampened. Left: Resting left ankle-brachial index indicates severe left lower extremity arterial disease. The left toe-brachial index is abnormal. LT Great toe pressure = 26 mmHg. PPG tracings appear dampened.  *See table(s) above for measurements and observations.  Electronically signed by Ruta Hinds MD on 07/08/2019 at 1:51:45 PM.    Final    Ct Renal Stone Study  Result Date: 07/31/2019 CLINICAL DATA:  Abdominal pain. EXAM: CT ABDOMEN AND PELVIS WITHOUT CONTRAST TECHNIQUE: Multidetector CT imaging of the abdomen and pelvis was performed following the standard protocol without IV contrast. COMPARISON:  11/11/2018 FINDINGS: Lower chest: Minimal bilateral dependent atelectasis or scarring, without significant change. Stable 6 mm partially calcified subpleural nodule in the lateral aspect of the right upper lobe, anteriorly, on image number 7 series 5. Borderline enlarged heart. Atheromatous coronary artery calcifications. Hepatobiliary: Intrahepatic biliary air is again demonstrated with stable mild dilatation of the intrahepatic ducts. A biliary stent extending from the proximal common duct into the duodenum is currently demonstrated without the previously  demonstrated extrahepatic biliary ductal dilatation. Cholecystectomy clips. Pancreas: Unremarkable. No pancreatic ductal dilatation or surrounding inflammatory changes. Spleen: Normal in size without focal abnormality. Adrenals/Urinary Tract: Normal appearing adrenal glands. The kidneys remains small, containing multiple cysts and vascular calcifications. No significant urine in the urinary bladder. No visible ureteral abnormalities. Stomach/Bowel: Stomach is within normal limits. Appendix appears normal. No evidence of bowel wall thickening, distention, or inflammatory changes. Vascular/Lymphatic: Atheromatous arterial calcifications without aneurysm. No enlarged lymph nodes. Reproductive: Prostate is unremarkable. Other: No abdominal wall hernia or abnormality. No abdominopelvic ascites. Musculoskeletal: Ray cages at the L3-4 level and solid interbody bone and pedicle screw and rod fusion at the L4-5 level. Lumbar and lower thoracic spine degenerative changes. IMPRESSION: No acute abnormality. Electronically Signed   By: Claudie Revering M.D.   On: 07/31/2019 15:35    Lab Data:  CBC: Recent Labs  Lab 07/31/19 1213 07/31/19 1251 08/01/19 0231  WBC 10.0  --  8.9  NEUTROABS 8.1*  --   --   HGB 13.5 10.2* 10.1*  HCT 44.2 30.0* 32.1*  MCV 98.9  --  97.0  PLT 209  --  413   Basic Metabolic Panel: Recent Labs  Lab 07/31/19 1213 07/31/19 1251 07/31/19 1722 08/01/19 0231 08/02/19 0720  NA 138 136 139 140 138  K 5.7* 6.8* 5.7* 5.4* 4.1  CL 95* 107 98 101 99  CO2 25  --  25 25 24   GLUCOSE 148* 123* 127* 130* 74  BUN 41* 53* 46* 51* 24*  CREATININE 8.24* 7.10* 8.48* 9.37* 6.07*  CALCIUM 8.9  --  8.6* 8.7* 8.0*   GFR: Estimated Creatinine Clearance: 14.3 mL/min (A) (by C-G formula based on SCr of 6.07 mg/dL (H)). Liver Function Tests: Recent Labs  Lab 07/31/19 1213 08/01/19 0231  AST 40 25  ALT 63* 48*  ALKPHOS 147* 132*  BILITOT 1.1 0.9  PROT 7.3 6.8  ALBUMIN 3.2* 2.9*   Recent  Labs  Lab 07/31/19 1213  LIPASE 33   No results for input(s): AMMONIA in the last 168 hours. Coagulation Profile: Recent Labs  Lab 07/31/19 1213  INR 1.1   Cardiac Enzymes: No results for input(s): CKTOTAL, CKMB, CKMBINDEX,  TROPONINI in the last 168 hours. BNP (last 3 results) No results for input(s): PROBNP in the last 8760 hours. HbA1C: No results for input(s): HGBA1C in the last 72 hours. CBG: Recent Labs  Lab 08/01/19 0852 08/01/19 1139 08/01/19 1807 08/01/19 2051 08/02/19 0734  GLUCAP 96 92 81 116* 70   Lipid Profile: No results for input(s): CHOL, HDL, LDLCALC, TRIG, CHOLHDL, LDLDIRECT in the last 72 hours. Thyroid Function Tests: Recent Labs    07/31/19 1722  TSH 0.564  FREET4 0.94   Anemia Panel: No results for input(s): VITAMINB12, FOLATE, FERRITIN, TIBC, IRON, RETICCTPCT in the last 72 hours. Urine analysis:    Component Value Date/Time   COLORURINE LT. YELLOW 06/26/2013 1200   APPEARANCEUR CLEAR 06/26/2013 1200   LABSPEC 1.020 06/26/2013 1200   PHURINE 8.5 06/26/2013 1200   GLUCOSEU 100 06/26/2013 1200   HGBUR MODERATE 06/26/2013 1200   BILIRUBINUR NEGATIVE 06/26/2013 1200   KETONESUR NEGATIVE 06/26/2013 1200   PROTEINUR >300 (A) 01/04/2010 1032   UROBILINOGEN 0.2 06/26/2013 1200   NITRITE NEGATIVE 06/26/2013 1200   LEUKOCYTESUR TRACE 06/26/2013 1200     Benito Mccreedy M.D. [facility] 08/02/2019, 11:36 AM  Pager: [phone] Between 7am to 7pm - call Pager - [phone]  After 7pm go to [email or web address] - password [email or web address]  Call night coverage person covering after 7pm

## 2019-08-02 NOTE — Progress Notes (Signed)
[facility] PROGRESS NOTE Subjective Patient has been having pain in his legs but states the pain in his upper abdomen is much improved but still present.  Remains on antibiotics.  Objective: Vital signs in last 24 hours: Temp:  [97.5 F (36.4 C)-98.6 F (37 C)] 97.5 F (36.4 C) (10/31 2151) Pulse Rate:  [68-84] 84 (10/31 2151) Resp:  [12-19] 16 (10/31 2151) BP: (110-154)/(58-89) 154/64 (10/31 2151) SpO2:  [96 %-100 %] 100 % (10/31 2151) Weight:  [103.3 kg-104.8 kg] 103.3 kg (10/31 1657) Last BM Date: 07/31/19  Intake/Output from previous day: 10/31 0701 - 11/01 0700 In: 240 [P.O.:240] Out: 1500  Intake/Output this shift: No intake/output data recorded.  PE: General--just waking up no distress Abdomen--obese, minimal right upper quadrant tenderness.  Lab Results: Recent Labs    07/31/19 1213 07/31/19 1251 08/01/19 0231  WBC 10.0  --  8.9  HGB 13.5 10.2* 10.1*  HCT 44.2 30.0* 32.1*  PLT 209  --  216   BMET Recent Labs    07/31/19 1213 07/31/19 1251 07/31/19 1722 08/01/19 0231  NA 138 136 139 140  K 5.7* 6.8* 5.7* 5.4*  CL 95* 107 98 101  CO2 25  --  25 25  CREATININE 8.24* 7.10* 8.48* 9.37*   LFT Recent Labs    07/31/19 1213 08/01/19 0231  PROT 7.3 6.8  AST 40 25  ALT 63* 48*  ALKPHOS 147* 132*  BILITOT 1.1 0.9   PT/INR Recent Labs    07/31/19 1213  LABPROT 13.6  INR 1.1   PANCREAS Recent Labs    07/31/19 1213  LIPASE 33         Studies/Results: Ct Head Wo Contrast  Result Date: 07/31/2019 CLINICAL DATA:  Encephalopathy EXAM: CT HEAD WITHOUT CONTRAST TECHNIQUE: Contiguous axial images were obtained from the base of the skull through the vertex without intravenous contrast. COMPARISON:  10/04/2018 FINDINGS: Brain: Stable atrophy and chronic white matter microvascular ischemic changes throughout both cerebral hemispheres. No acute intracranial hemorrhage, new mass lesion, definite infarction, midline shift, herniation,  hydrocephalus, or extra-axial fluid collection. No focal mass effect or edema. Cisterns are patent. Minor cerebellar atrophy. Vascular: No hyperdense vessel or unexpected calcification. Skull: Normal. Negative for fracture or focal lesion. Sinuses/Orbits: No acute finding. Other: None. IMPRESSION: Stable atrophy and chronic white matter microvascular ischemic changes. No interval change or acute process by noncontrast CT. Electronically Signed   By: Jerilynn Mages.  Shick M.D.   On: 07/31/2019 15:27   Dg Chest Portable 1 View  Result Date: 07/31/2019 CLINICAL DATA:  Chest pain, fever, dialysis patient. EXAM: PORTABLE CHEST 1 VIEW COMPARISON:  07/03/2019 FINDINGS: Borderline enlarged cardiac silhouette. The aorta remains tortuous and calcified. Stable linear scarring in the left mid lung zone. Mildly prominent interstitial markings with improvement. Cervical spine fixation hardware. Right shoulder degenerative changes. IMPRESSION: 1. No acute abnormality. 2. Stable borderline cardiomegaly. 3. Mild chronic interstitial lung disease. Electronically Signed   By: Claudie Revering M.D.   On: 07/31/2019 11:49   Ct Renal Stone Study  Result Date: 07/31/2019 CLINICAL DATA:  Abdominal pain. EXAM: CT ABDOMEN AND PELVIS WITHOUT CONTRAST TECHNIQUE: Multidetector CT imaging of the abdomen and pelvis was performed following the standard protocol without IV contrast. COMPARISON:  11/11/2018 FINDINGS: Lower chest: Minimal bilateral dependent atelectasis or scarring, without significant change. Stable 6 mm partially calcified subpleural nodule in the lateral aspect of the right upper lobe, anteriorly, on image number 7 series 5. Borderline enlarged heart. Atheromatous coronary artery calcifications. Hepatobiliary: Intrahepatic  biliary air is again demonstrated with stable mild dilatation of the intrahepatic ducts. A biliary stent extending from the proximal common duct into the duodenum is currently demonstrated without the previously  demonstrated extrahepatic biliary ductal dilatation. Cholecystectomy clips. Pancreas: Unremarkable. No pancreatic ductal dilatation or surrounding inflammatory changes. Spleen: Normal in size without focal abnormality. Adrenals/Urinary Tract: Normal appearing adrenal glands. The kidneys remains small, containing multiple cysts and vascular calcifications. No significant urine in the urinary bladder. No visible ureteral abnormalities. Stomach/Bowel: Stomach is within normal limits. Appendix appears normal. No evidence of bowel wall thickening, distention, or inflammatory changes. Vascular/Lymphatic: Atheromatous arterial calcifications without aneurysm. No enlarged lymph nodes. Reproductive: Prostate is unremarkable. Other: No abdominal wall hernia or abnormality. No abdominopelvic ascites. Musculoskeletal: Ray cages at the L3-4 level and solid interbody bone and pedicle screw and rod fusion at the L4-5 level. Lumbar and lower thoracic spine degenerative changes. IMPRESSION: No acute abnormality. Electronically Signed   By: Claudie Revering M.D.   On: 07/31/2019 15:35    Medications: I have reviewed the patient's current medications.  Assessment:   1.  Possible early sepsis/CBD stones with indwelling biliary stent/mild right upper quadrant pain.  Is not clear that the source of all this is biliary however he does have a slight bump in his LFTs but total bili is normal and he feels much better with antibiotics.  He was due to have the biliary stent removed or changed in October at roughly 2 months.  I think it is reasonable to go ahead and remove the stent evaluate for CBD stones remove them and/or replace a stent.  I have discussed this with him again today and he is agreeable to go ahead and have this done.   Plan: Patient is scheduled for ERCP 1130 tomorrow by Dr. Paulita Fujita.  Have discussed in detail with the patient again.  He will be n.p.o. after midnight.   Nancy Fetter 08/02/2019, 7:42 AM  This note  was created using voice recognition software. Minor errors may Have occurred unintentionally.  Pager: [phone] If no answer or after hours call [phone]

## 2019-08-02 NOTE — Evaluation (Signed)
Occupational Therapy Evaluation Patient Details Name: ADALBERT ALBERTO MRN: [id number] DOB: 02-01-1951 Today's Date: 08/02/2019    History of Present Illness 68 y.o. male with a PMH including ESRD on hemodialysis, cholecystectomy and recurrent gallstones, CAD, CHF, T2DM, who presented to the ED on 07/31/2019 with weakness and confusion. COVID-19 was negative, no acute infiltrate on chest x-ray.  Patient started on IV antibiotics admitted for early sepsis.   Clinical Impression   Patient evaluated by Occupational Therapy with no further acute OT needs identified. All education has been completed and the patient has no further questions. Pt minimally participative. He demonstrates decreased activity tolerance, generalized weakness, and h/o frequent falls.  He is not interested in OT while in hospital, but is amenable to [facility].  He has all needed DME.  He See below for any follow-up Occupational Therapy or equipment needs. OT is signing off. Thank you for this referral.      Follow Up Recommendations  Home health OT;Supervision/Assistance - 24 hour    Equipment Recommendations  None recommended by OT    Recommendations for Other Services       Precautions / Restrictions        Mobility Bed Mobility Overal bed mobility: Independent                Transfers Overall transfer level: Modified independent                    Balance Overall balance assessment: History of Falls                                         ADL either performed or assessed with clinical judgement   ADL                                         General ADL Comments: Pt requries supervision/set up for ADLs      Vision Patient Visual Report: No change from baseline       Perception     Praxis      Pertinent Vitals/Pain Pain Assessment: No/denies pain     Hand Dominance Left   Extremity/Trunk Assessment Upper Extremity Assessment Upper Extremity  Assessment: Overall WFL for tasks assessed   Lower Extremity Assessment Lower Extremity Assessment: Overall WFL for tasks assessed   Cervical / Trunk Assessment Cervical / Trunk Assessment: Normal   Communication Communication Communication: No difficulties   Cognition Arousal/Alertness: Awake/alert Behavior During Therapy: WFL for tasks assessed/performed Overall Cognitive Status: No family/caregiver present to determine baseline cognitive functioning                                 General Comments: Pt with h/o dementia.  Pt able to relay details of medical history that appear accurate based on chart review.  He recalls seeing PT earlier.  He does demonstrate difficulty generalizing new information    General Comments  Pt does not wish to have therapy in the hospital, but reports he is ammenable to [facility] therapies.  He has all needed DME     Exercises     Shoulder Instructions      Home Living Family/patient expects to be discharged to:: Private residence Living Arrangements: Spouse/significant other Available  Help at Discharge: Family;Available 24 hours/day Type of Home: House Home Access: Stairs to enter [employer] of Steps: 3 Entrance Stairs-Rails: Right Home Layout: One level     Bathroom Shower/Tub: Occupational psychologist: Standard Bathroom Accessibility: Yes   Home Equipment: Wheelchair - manual;Walker - standard;Shower seat;Bedside commode   Additional Comments: Pt reports he lives with wife, who is very supportive       Prior Functioning/Environment Level of Independence: Independent with assistive device(s)        Comments: Pt reports frequently falls at home which have increased in frequency.   He reports he does not drive and wife perform most IADLs.  He reports he feels fatigued and terrrible after HD often sleeping the whole day         OT Problem List: Decreased activity tolerance;Pain      OT  Treatment/Interventions:      OT Goals(Current goals can be found in the care plan section) Acute Rehab OT Goals Patient Stated Goal: to rest and to get better  OT Goal Formulation: All assessment and education complete, DC therapy  OT Frequency:     Barriers to D/C:            Co-evaluation              AM-PAC OT "6 Clicks" Daily Activity     Outcome Measure Help from another person eating meals?: None Help from another person taking care of personal grooming?: A Little Help from another person toileting, which includes using toliet, bedpan, or urinal?: A Little Help from another person bathing (including washing, rinsing, drying)?: A Little Help from another person to put on and taking off regular upper body clothing?: A Little Help from another person to put on and taking off regular lower body clothing?: A Little 6 Click Score: 19   End of Session    Activity Tolerance: Patient limited by fatigue Patient left: in bed;with call bell/phone within reach;with bed alarm set  OT Visit Diagnosis: Unsteadiness on feet (R26.81);Pain Pain - part of body: (abdominal )                Time: 1538-1600 OT Time Calculation (min): 22 min Charges:  OT General Charges $OT Visit: 1 Visit OT Evaluation $OT Eval Low Complexity: 1 Low  Lucille Passy, OTR/L Acute Rehabilitation Services Pager [phone] Office [phone]   Lucille Passy M 08/02/2019, 4:16 PM

## 2019-08-03 ENCOUNTER — Inpatient Hospital Stay (HOSPITAL_COMMUNITY): Payer: Medicare Other | Admitting: Anesthesiology

## 2019-08-03 ENCOUNTER — Other Ambulatory Visit: Payer: Self-pay

## 2019-08-03 ENCOUNTER — Encounter (HOSPITAL_COMMUNITY): Payer: Self-pay

## 2019-08-03 ENCOUNTER — Inpatient Hospital Stay (HOSPITAL_COMMUNITY): Payer: Medicare Other

## 2019-08-03 ENCOUNTER — Encounter (HOSPITAL_COMMUNITY)
Admission: EM | Disposition: A | Discharge status: Home Health | Payer: Self-pay | Source: Home / Self Care | Attending: Internal Medicine

## 2019-08-03 DIAGNOSIS — E785 Hyperlipidemia, unspecified: Secondary | ICD-10-CM

## 2019-08-03 DIAGNOSIS — I10 Essential (primary) hypertension: Secondary | ICD-10-CM

## 2019-08-03 HISTORY — PX: STENT REMOVAL: SHX6421

## 2019-08-03 HISTORY — PX: REMOVAL OF STONES: SHX5545

## 2019-08-03 HISTORY — PX: ERCP: SHX5425

## 2019-08-03 HISTORY — PX: BILIARY STENT PLACEMENT: SHX5538

## 2019-08-03 LAB — GLUCOSE, CAPILLARY
Glucose-Capillary: 108 mg/dL — ABNORMAL HIGH (ref 70–99)
Glucose-Capillary: 116 mg/dL — ABNORMAL HIGH (ref 70–99)
Glucose-Capillary: 127 mg/dL — ABNORMAL HIGH (ref 70–99)
Glucose-Capillary: 77 mg/dL (ref 70–99)
Glucose-Capillary: 78 mg/dL (ref 70–99)
Glucose-Capillary: 81 mg/dL (ref 70–99)
Glucose-Capillary: 83 mg/dL (ref 70–99)

## 2019-08-03 LAB — BASIC METABOLIC PANEL
Anion gap: 14 (ref 5–15)
BUN: 33 mg/dL — ABNORMAL HIGH (ref 8–23)
CO2: 22 mmol/L (ref 22–32)
Calcium: 8.4 mg/dL — ABNORMAL LOW (ref 8.9–10.3)
Chloride: 100 mmol/L (ref 98–111)
Creatinine, Ser: 8.21 mg/dL — ABNORMAL HIGH (ref 0.61–1.24)
GFR calc Af Amer: 7 mL/min — ABNORMAL LOW (ref >60)
GFR calc non Af Amer: 6 mL/min — ABNORMAL LOW (ref >60)
Glucose, Bld: 91 mg/dL (ref 70–99)
Potassium: 4.2 mmol/L (ref 3.5–5.1)
Sodium: 136 mmol/L (ref 135–145)

## 2019-08-03 SURGERY — ERCP, WITH INTERVENTION IF INDICATED
Anesthesia: General

## 2019-08-03 MED ORDER — SODIUM CHLORIDE 0.9 % IV SOLN
INTRAVENOUS | Status: DC | PRN
  Administered 2019-08-03: 13:00:00 40 mL

## 2019-08-03 MED ORDER — FENTANYL CITRATE (PF) 250 MCG/5ML IJ SOLN
INTRAMUSCULAR | Status: DC | PRN
  Administered 2019-08-03 (×2): 50 ug via INTRAVENOUS

## 2019-08-03 MED ORDER — PHENYLEPHRINE 40 MCG/ML (10ML) SYRINGE FOR IV PUSH (FOR BLOOD PRESSURE SUPPORT)
PREFILLED_SYRINGE | INTRAVENOUS | Status: DC | PRN
  Administered 2019-08-03: 8 ug via INTRAVENOUS
  Administered 2019-08-03: 80 ug via INTRAVENOUS

## 2019-08-03 MED ORDER — LACTATED RINGERS IV SOLN
INTRAVENOUS | Status: DC | PRN
  Administered 2019-08-03: 12:00:00 via INTRAVENOUS

## 2019-08-03 MED ORDER — CHLORHEXIDINE GLUCONATE CLOTH 2 % EX PADS
6.0000 | MEDICATED_PAD | Freq: Every day | CUTANEOUS | Status: DC
  Administered 2019-08-03 – 2019-08-05 (×3): 6 via TOPICAL

## 2019-08-03 MED ORDER — INDOMETHACIN 50 MG RE SUPP
RECTAL | Status: AC
  Filled 2019-08-03: qty 2

## 2019-08-03 MED ORDER — ONDANSETRON HCL 4 MG/2ML IJ SOLN
INTRAMUSCULAR | Status: DC | PRN
  Administered 2019-08-03: 4 mg via INTRAVENOUS

## 2019-08-03 MED ORDER — GLUCAGON HCL RDNA (DIAGNOSTIC) 1 MG IJ SOLR
INTRAMUSCULAR | Status: AC
  Filled 2019-08-03: qty 1

## 2019-08-03 MED ORDER — DEXAMETHASONE SODIUM PHOSPHATE 10 MG/ML IJ SOLN
INTRAMUSCULAR | Status: DC | PRN
  Administered 2019-08-03: 5 mg via INTRAVENOUS

## 2019-08-03 MED ORDER — LIDOCAINE 2% (20 MG/ML) 5 ML SYRINGE
INTRAMUSCULAR | Status: DC | PRN
  Administered 2019-08-03: 100 mg via INTRAVENOUS

## 2019-08-03 MED ORDER — PHENYLEPHRINE HCL-NACL 10-0.9 MG/250ML-% IV SOLN
INTRAVENOUS | Status: DC | PRN
  Administered 2019-08-03: 25 ug/min via INTRAVENOUS

## 2019-08-03 MED ORDER — SUCCINYLCHOLINE CHLORIDE 200 MG/10ML IV SOSY
PREFILLED_SYRINGE | INTRAVENOUS | Status: DC | PRN
  Administered 2019-08-03: 50 mg via INTRAVENOUS

## 2019-08-03 MED ORDER — SODIUM CHLORIDE 0.9 % IV SOLN
INTRAVENOUS | Status: DC
  Administered 2019-08-03: 11:00:00 via INTRAVENOUS

## 2019-08-03 MED ORDER — INDOMETHACIN 50 MG RE SUPP
RECTAL | Status: DC | PRN
  Administered 2019-08-03: 100 mg via RECTAL

## 2019-08-03 MED ORDER — PROPOFOL 10 MG/ML IV BOLUS
INTRAVENOUS | Status: DC | PRN
  Administered 2019-08-03: 100 mg via INTRAVENOUS
  Administered 2019-08-03 (×2): 50 mg via INTRAVENOUS

## 2019-08-03 MED ORDER — FENTANYL CITRATE (PF) 100 MCG/2ML IJ SOLN
INTRAMUSCULAR | Status: AC
  Filled 2019-08-03: qty 2

## 2019-08-03 NOTE — Progress Notes (Signed)
[facility]                                                                              Patient Demographics  [name], is a 68 y.o. male, DOB - 11/23/1950, MBE:[id number]  Admit date - 07/31/2019   Admitting Physician Alma Friendly, MD  Outpatient Primary MD for the patient is Biagio Borg, MD  Outpatient specialists:   LOS - 3  days    No chief complaint on file.      Brief summary   [name] is a 68 y.o. male with medical history significant for  but not limited to dementia, diabetes mellitus type 2, ESRD on HD T/TH/S, CAD, CHF, presenting with few days history of generalized weakness and mental status change associated with fever of 100.4, mild tachycardia, hypotensive. COVID-19 was negative, no acute infiltrate on chest x-ray.  Patient started on IV antibiotics admitted for early sepsis.  Assessment & Plan    Principal Problem:   Sepsis ([place]) Active Problems:   Gout   Dementia ([place])   ESRD on hemodialysis ([place])   Essential hypertension  Sepsis Presents with tachycardia, hypotension, elevated lactic acid level Source unclear, high risk for cholangitis given history of multiple gallstones with multiple ERCP Follow-up blood cultures Continue broad-spectrum IV antibiotics Continue IV fluids Seen by GI, and patient underwent ERCP with multiple stones removed and old stent removed. New biliary stent was placed Currently on clear liquids  Metabolic encephalopathy, acute CT head negative Improving, follow-up   End-stage renal disease Routine hemodialysis Monitor electrolytes and renal function per nephrology    Chronic systolic/diastolic HF Appears euvolemic TEE done on 7/20 showed EF of 60 to 65% Volume status managed with HD No longer on ACE/ARB's, BB due to low BP with ESRD  CAD status post stent to RCA in 2008, recurrent NSTEMI Chest pain free Continue aspirin No longer on Plavix due to rectal bleeding from possible  radiation proctitis Continue Zetia, [facility]-9  Diabetes mellitus type 2 Last A1c on 05/2019 was 6 Diet-controlled SSI, Accu-Cheks, hypoglycemic protocol Blood sugars stable  Hyperthyroidism TSH, free T4 normal range Continue methimazole  COPD On O2 at night  OSA on CPAP CPAP ordered QHS      Code Status: Full code DVT Prophylaxis:   heparin  Family Communication: Discussed in detail with the patient, all imaging results, lab results explained to the patient  Disposition Plan: to be determined  Time Spent in minutes 35 minutes  Procedures:  ERCP 11/2  Consultants:   GI Nephrology  Antimicrobials:      Medications  Scheduled Meds:  allopurinol  100 mg Oral Daily   aspirin EC  325 mg Oral Daily   Chlorhexidine Gluconate Cloth  6 each Topical Q0600   cinacalcet  90 mg Oral Q supper   darbepoetin (ARANESP) injection - DIALYSIS  40 mcg Intravenous Q Sat-HD   doxercalciferol  3 mcg Intravenous Q T,Th,Sa-HD   ezetimibe  10 mg Oral QPM   ferric citrate  630 mg Oral TID WC   gabapentin  300 mg Oral BID   heparin  5,000 Units Subcutaneous Q8H  insulin aspart  0-9 Units Subcutaneous Q4H   methimazole  2.5 mg Oral Daily   midodrine  10 mg Oral Q T,Th,Sa-HD   multivitamin  1 tablet Oral Daily   pantoprazole  40 mg Oral QHS   Continuous Infusions:  ceFEPime (MAXIPIME) IV 1 g (08/02/19 1112)   metronidazole 500 mg (08/03/19 1417)   PRN Meds:.acetaminophen **OR** acetaminophen, albuterol, diphenhydrAMINE, HYDROcodone-acetaminophen, hydroxypropyl methylcellulose / hypromellose, loperamide, ondansetron **OR** ondansetron (ZOFRAN) IV, polyethylene glycol   Antibiotics   Anti-infectives (From admission, onward)   Start     Dose/Rate Route Frequency Ordered Stop   08/01/19 0600  ceFEPIme (MAXIPIME) 1 g in sodium chloride 0.9 % 100 mL IVPB  Status:  Discontinued     1 g 200 mL/hr over 30 Minutes Intravenous Every 8 hours 07/31/19 1738 07/31/19  2038   08/01/19 0000  vancomycin (VANCOCIN) 2,500 mg in sodium chloride 0.9 % 500 mL IVPB  Status:  Discontinued     2,500 mg 250 mL/hr over 120 Minutes Intravenous Every 24 hours 07/31/19 1738 07/31/19 1742   07/31/19 2200  metroNIDAZOLE (FLAGYL) IVPB 500 mg     500 mg 100 mL/hr over 60 Minutes Intravenous Every 8 hours 07/31/19 1737     07/31/19 1745  vancomycin (VANCOCIN) 2,500 mg in sodium chloride 0.9 % 500 mL IVPB  Status:  Discontinued     2,500 mg 250 mL/hr over 120 Minutes Intravenous Every 24 hours 07/31/19 1737 07/31/19 1738   07/31/19 1745  ceFEPIme (MAXIPIME) 1 g in sodium chloride 0.9 % 100 mL IVPB  Status:  Discontinued     1 g 200 mL/hr over 30 Minutes Intravenous Every 8 hours 07/31/19 1737 07/31/19 1738   07/31/19 1741  vancomycin variable dose per unstable renal function (pharmacist dosing)  Status:  Discontinued      Does not apply See admin instructions 07/31/19 1742 08/01/19 1349   07/31/19 1345  ceFEPIme (MAXIPIME) 1 g in sodium chloride 0.9 % 100 mL IVPB     1 g 200 mL/hr over 30 Minutes Intravenous  Once 07/31/19 1332 07/31/19 1445   07/31/19 1130  ceFEPIme (MAXIPIME) 2 g in sodium chloride 0.9 % 100 mL IVPB  Status:  Discontinued     2 g 200 mL/hr over 30 Minutes Intravenous  Once 07/31/19 1118 07/31/19 1123   07/31/19 1130  metroNIDAZOLE (FLAGYL) IVPB 500 mg  Status:  Discontinued     500 mg 100 mL/hr over 60 Minutes Intravenous  Once 07/31/19 1118 07/31/19 1737   07/31/19 1130  vancomycin (VANCOCIN) IVPB 1000 mg/200 mL premix  Status:  Discontinued     1,000 mg 200 mL/hr over 60 Minutes Intravenous  Once 07/31/19 1118 07/31/19 1121   07/31/19 1130  vancomycin (VANCOCIN) 2,500 mg in sodium chloride 0.9 % 500 mL IVPB     2,500 mg 250 mL/hr over 120 Minutes Intravenous  Once 07/31/19 1121 07/31/19 1840   07/31/19 1130  ceFEPIme (MAXIPIME) 1 g in sodium chloride 0.9 % 100 mL IVPB     1 g 200 mL/hr over 30 Minutes Intravenous Every 24 hours 07/31/19 1123           Subjective:   [name] continues to have RUQ abdominal pain, no nausea or vomiting.  Objective:   Vitals:   08/03/19 1315 08/03/19 1325 08/03/19 1335 08/03/19 1337  BP: (!) 107/57 119/60 121/61 (!) 143/73  Pulse: 76 72 70 71  Resp: 17 14 14 11   Temp:    98.5 F (  36.9 C)  TempSrc:    Oral  SpO2: 91% 97% 94% 97%  Weight:      Height:        Intake/Output Summary (Last 24 hours) at 08/03/2019 2037 Last data filed at 08/03/2019 1845 Gross per 24 hour  Intake 1198.18 ml  Output 30 ml  Net 1168.18 ml     Wt Readings from Last 3 Encounters:  08/01/19 103.3 kg  07/08/19 102.9 kg  07/03/19 102.5 kg     Exam General exam: Alert, awake, oriented x 3 Respiratory system: Clear to auscultation. Respiratory effort normal. Cardiovascular system:RRR. No murmurs, rubs, gallops. Gastrointestinal system: Abdomen is nondistended, soft and tender in RUQ. No organomegaly or masses felt. Normal bowel sounds heard. Central nervous system: Alert and oriented. No focal neurological deficits. Extremities: No C/C/E, +pedal pulses Skin: No rashes, lesions or ulcers  Psychiatry: Judgement and insight appear normal. Mood & affect appropriate.      Data Reviewed:  I have personally reviewed following labs and imaging studies  Micro Results Recent Results (from the past 240 hour(s))  Blood Culture (routine x 2)     Status: None (Preliminary result)   Collection Time: 07/31/19 12:13 PM   Specimen: BLOOD  Result Value Ref Range Status   Specimen Description BLOOD LEFT ANTECUBITAL  Final   Special Requests AEROBIC BOTTLE ONLY Blood Culture adequate volume  Final   Culture   Final    NO GROWTH 3 DAYS Performed at [facility], [address], [place], [place] [address]    Report Status PENDING  Incomplete  Blood Culture (routine x 2)     Status: None (Preliminary result)   Collection Time: 07/31/19 12:13 PM   Specimen: BLOOD  Result Value Ref Range Status   Specimen  Description BLOOD LEFT ANTECUBITAL  Final   Special Requests   Final    BOTTLES DRAWN AEROBIC AND ANAEROBIC Blood Culture adequate volume   Culture   Final    NO GROWTH 3 DAYS Performed at [facility], [name] [address] [place], [place] [address]    Report Status PENDING  Incomplete  SARS Coronavirus 2 by RT PCR (hospital order, performed in [facility] hospital lab) Nasopharyngeal Nasopharyngeal Swab     Status: None   Collection Time: 07/31/19  1:25 PM   Specimen: Nasopharyngeal Swab  Result Value Ref Range Status   SARS Coronavirus 2 NEGATIVE NEGATIVE Final    Comment: (NOTE) If result is NEGATIVE SARS-CoV-2 target nucleic acids are NOT DETECTED. The SARS-CoV-2 RNA is generally detectable in upper and lower  respiratory specimens during the acute phase of infection. The lowest  concentration of SARS-CoV-2 viral copies this assay can detect is 250  copies / mL. A negative result does not preclude SARS-CoV-2 infection  and should not be used as the sole basis for treatment or other  patient management decisions.  A negative result may occur with  improper specimen collection / handling, submission of specimen other  than nasopharyngeal swab, presence of viral mutation(s) within the  areas targeted by this assay, and inadequate number of viral copies  (<250 copies / mL). A negative result must be combined with clinical  observations, patient history, and epidemiological information. If result is POSITIVE SARS-CoV-2 target nucleic acids are DETECTED. The SARS-CoV-2 RNA is generally detectable in upper and lower  respiratory specimens dur ing the acute phase of infection.  Positive  results are indicative of active infection with SARS-CoV-2.  Clinical  correlation  with patient history and other diagnostic information is  necessary to determine patient infection status.  Positive results do  not rule out bacterial infection or co-infection with other viruses. If result is  PRESUMPTIVE POSTIVE SARS-CoV-2 nucleic acids MAY BE PRESENT.   A presumptive positive result was obtained on the submitted specimen  and confirmed on repeat testing.  While 2019 novel coronavirus  (SARS-CoV-2) nucleic acids may be present in the submitted sample  additional confirmatory testing may be necessary for epidemiological  and / or clinical management purposes  to differentiate between  SARS-CoV-2 and other Sarbecovirus currently known to infect humans.  If clinically indicated additional testing with an alternate test  methodology [phone]) is advised. The SARS-CoV-2 RNA is generally  detectable in upper and lower respiratory sp ecimens during the acute  phase of infection. The expected result is Negative. Fact Sheet for Patients:  StrictlyIdeas.no Fact Sheet for Healthcare Providers: BankingDealers.co.za This test is not yet approved or cleared by the [place] FDA and has been authorized for detection and/or diagnosis of SARS-CoV-2 by FDA under an Emergency Use Authorization (EUA).  This EUA will remain in effect (meaning this test can be used) for the duration of the COVID-19 declaration under Section 564(b)(1) of the Act, 21 U.S.C. section 360bbb-3(b)(1), unless the authorization is terminated or revoked sooner. Performed at [facility], [place] [address] [place], [place] [address]     Radiology Reports Ct Head Wo Contrast  Result Date: 07/31/2019 CLINICAL DATA:  Encephalopathy EXAM: CT HEAD WITHOUT CONTRAST TECHNIQUE: Contiguous axial images were obtained from the base of the skull through the vertex without intravenous contrast. COMPARISON:  10/04/2018 FINDINGS: Brain: Stable atrophy and chronic white matter microvascular ischemic changes throughout both cerebral hemispheres. No acute intracranial hemorrhage, new mass lesion, definite infarction, midline shift, herniation, hydrocephalus, or extra-axial fluid  collection. No focal mass effect or edema. Cisterns are patent. Minor cerebellar atrophy. Vascular: No hyperdense vessel or unexpected calcification. Skull: Normal. Negative for fracture or focal lesion. Sinuses/Orbits: No acute finding. Other: None. IMPRESSION: Stable atrophy and chronic white matter microvascular ischemic changes. No interval change or acute process by noncontrast CT. Electronically Signed   By: Jerilynn Mages.  Shick M.D.   On: 07/31/2019 15:27   Dg Chest Portable 1 View  Result Date: 07/31/2019 CLINICAL DATA:  Chest pain, fever, dialysis patient. EXAM: PORTABLE CHEST 1 VIEW COMPARISON:  07/03/2019 FINDINGS: Borderline enlarged cardiac silhouette. The aorta remains tortuous and calcified. Stable linear scarring in the left mid lung zone. Mildly prominent interstitial markings with improvement. Cervical spine fixation hardware. Right shoulder degenerative changes. IMPRESSION: 1. No acute abnormality. 2. Stable borderline cardiomegaly. 3. Mild chronic interstitial lung disease. Electronically Signed   By: Claudie Revering M.D.   On: 07/31/2019 11:49   Dg Ercp  Result Date: 08/03/2019 CLINICAL DATA:  68 year old male with a history of choledocholithiasis EXAM: ERCP TECHNIQUE: Multiple spot images obtained with the fluoroscopic device and submitted for interpretation post-procedure. FLUOROSCOPY TIME:  Fluoroscopy Time:  6 minutes 6 seconds COMPARISON:  None FINDINGS: Limited intraoperative fluoroscopic spot images during ERCP. Initial image demonstrates endoscope projecting over the upper abdomen. There is then cannulation of the ampulla and retrograde infusion of contrast, with incomplete opacification of the extrahepatic biliary system. Final image demonstrates placement of a plastic biliary stent. Changes of cholecystectomy IMPRESSION: Limited images during ERCP demonstrates incomplete opacification of the extrahepatic biliary system with placement of a plastic biliary stent. Changes of prior  cholecystectomy. Please refer to the dictated  operative report for full details of intraoperative findings and procedure. Electronically Signed   By: Corrie Mckusick D.O.   On: 08/03/2019 13:15   Vas [place] Burnard Bunting With/wo Tbi  Result Date: 07/08/2019 LOWER EXTREMITY DOPPLER STUDY Indications: Claudication, ulceration, and peripheral artery disease. High Risk Factors: Hypertension, hyperlipidemia, Diabetes, past history of                    smoking, prior MI, coronary artery disease.  Performing Technologist: Burley Saver RVT  Examination Guidelines: A complete evaluation includes at minimum, Doppler waveform signals and systolic blood pressure reading at the level of bilateral brachial, anterior tibial, and posterior tibial arteries, when vessel segments are accessible. Bilateral testing is considered an integral part of a complete examination. Photoelectric Plethysmograph (PPG) waveforms and toe systolic pressure readings are included as required and additional duplex testing as needed. Limited examinations for reoccurring indications may be performed as noted.  ABI Findings: +---------+------------------+-----+-------------------+--------+  Right     Rt Pressure (mmHg) Index Waveform            Comment   +---------+------------------+-----+-------------------+--------+  Brachial                                               AVF       +---------+------------------+-----+-------------------+--------+  PTA       69                 0.51  dampened monophasic           +---------+------------------+-----+-------------------+--------+  DP        66                 0.49  dampened monophasic           +---------+------------------+-----+-------------------+--------+  Great Toe 44                 0.32  Abnormal                      +---------+------------------+-----+-------------------+--------+ +---------+------------------+-----+-------------------+-------+  Left      Lt Pressure (mmHg) Index Waveform            Comment   +---------+------------------+-----+-------------------+-------+  Brachial  136                                                   +---------+------------------+-----+-------------------+-------+  PTA       51                 0.38  dampened monophasic          +---------+------------------+-----+-------------------+-------+  DP        61                 0.45  dampened monophasic          +---------+------------------+-----+-------------------+-------+  Great Toe 26                 0.19  Abnormal                     +---------+------------------+-----+-------------------+-------+ +-------+-----------+-----------+------------+------------+  ABI/TBI Today's ABI Today's TBI Previous ABI Previous TBI  +-------+-----------+-----------+------------+------------+  Right   0.51  0.32        0.78         0.61          +-------+-----------+-----------+------------+------------+  Left    0.45        0.19        0.50         0.40          +-------+-----------+-----------+------------+------------+ Right ABIs and TBIs appear decreased compared to prior study on 05/07/2018. Left TBIs appear decreased compared to prior study on 05/07/2018. Left ABIs appear slightly decreased when compared to previous study.  Summary: Right: Resting right ankle-brachial index indicates moderate right lower extremity arterial disease. The right toe-brachial index is abnormal. RT great toe pressure = 44 mmHg. PPG tracings appear dampened. Left: Resting left ankle-brachial index indicates severe left lower extremity arterial disease. The left toe-brachial index is abnormal. LT Great toe pressure = 26 mmHg. PPG tracings appear dampened.  *See table(s) above for measurements and observations.  Electronically signed by Ruta Hinds MD on 07/08/2019 at 1:51:45 PM.    Final    Ct Renal Stone Study  Result Date: 07/31/2019 CLINICAL DATA:  Abdominal pain. EXAM: CT ABDOMEN AND PELVIS WITHOUT CONTRAST TECHNIQUE: Multidetector CT imaging of the abdomen  and pelvis was performed following the standard protocol without IV contrast. COMPARISON:  11/11/2018 FINDINGS: Lower chest: Minimal bilateral dependent atelectasis or scarring, without significant change. Stable 6 mm partially calcified subpleural nodule in the lateral aspect of the right upper lobe, anteriorly, on image number 7 series 5. Borderline enlarged heart. Atheromatous coronary artery calcifications. Hepatobiliary: Intrahepatic biliary air is again demonstrated with stable mild dilatation of the intrahepatic ducts. A biliary stent extending from the proximal common duct into the duodenum is currently demonstrated without the previously demonstrated extrahepatic biliary ductal dilatation. Cholecystectomy clips. Pancreas: Unremarkable. No pancreatic ductal dilatation or surrounding inflammatory changes. Spleen: Normal in size without focal abnormality. Adrenals/Urinary Tract: Normal appearing adrenal glands. The kidneys remains small, containing multiple cysts and vascular calcifications. No significant urine in the urinary bladder. No visible ureteral abnormalities. Stomach/Bowel: Stomach is within normal limits. Appendix appears normal. No evidence of bowel wall thickening, distention, or inflammatory changes. Vascular/Lymphatic: Atheromatous arterial calcifications without aneurysm. No enlarged lymph nodes. Reproductive: Prostate is unremarkable. Other: No abdominal wall hernia or abnormality. No abdominopelvic ascites. Musculoskeletal: Ray cages at the L3-4 level and solid interbody bone and pedicle screw and rod fusion at the L4-5 level. Lumbar and lower thoracic spine degenerative changes. IMPRESSION: No acute abnormality. Electronically Signed   By: Claudie Revering M.D.   On: 07/31/2019 15:35    Lab Data:  CBC: Recent Labs  Lab 07/31/19 1213 07/31/19 1251 08/01/19 0231  WBC 10.0  --  8.9  NEUTROABS 8.1*  --   --   HGB 13.5 10.2* 10.1*  HCT 44.2 30.0* 32.1*  MCV 98.9  --  97.0  PLT 209   --  376   Basic Metabolic Panel: Recent Labs  Lab 07/31/19 1213 07/31/19 1251 07/31/19 1722 08/01/19 0231 08/02/19 0720 08/03/19 0253  NA 138 136 139 140 138 136  K 5.7* 6.8* 5.7* 5.4* 4.1 4.2  CL 95* 107 98 101 99 100  CO2 25  --  25 25 24 22   GLUCOSE 148* 123* 127* 130* 74 91  BUN 41* 53* 46* 51* 24* 33*  CREATININE 8.24* 7.10* 8.48* 9.37* 6.07* 8.21*  CALCIUM 8.9  --  8.6* 8.7* 8.0* 8.4*   GFR: Estimated Creatinine Clearance: 10.6 mL/min (  A) (by C-G formula based on SCr of 8.21 mg/dL (H)). Liver Function Tests: Recent Labs  Lab 07/31/19 1213 08/01/19 0231  AST 40 25  ALT 63* 48*  ALKPHOS 147* 132*  BILITOT 1.1 0.9  PROT 7.3 6.8  ALBUMIN 3.2* 2.9*   Recent Labs  Lab 07/31/19 1213  LIPASE 33   No results for input(s): AMMONIA in the last 168 hours. Coagulation Profile: Recent Labs  Lab 07/31/19 1213  INR 1.1   Cardiac Enzymes: No results for input(s): CKTOTAL, CKMB, CKMBINDEX, TROPONINI in the last 168 hours. BNP (last 3 results) No results for input(s): PROBNP in the last 8760 hours. HbA1C: No results for input(s): HGBA1C in the last 72 hours. CBG: Recent Labs  Lab 08/03/19 0732 08/03/19 1049 08/03/19 1458 08/03/19 1606 08/03/19 2009  GLUCAP 77 81 108* 127* 116*   Lipid Profile: No results for input(s): CHOL, HDL, LDLCALC, TRIG, CHOLHDL, LDLDIRECT in the last 72 hours. Thyroid Function Tests: No results for input(s): TSH, T4TOTAL, FREET4, T3FREE, THYROIDAB in the last 72 hours. Anemia Panel: No results for input(s): VITAMINB12, FOLATE, FERRITIN, TIBC, IRON, RETICCTPCT in the last 72 hours. Urine analysis:    Component Value Date/Time   COLORURINE LT. YELLOW 06/26/2013 1200   APPEARANCEUR CLEAR 06/26/2013 1200   LABSPEC 1.020 06/26/2013 1200   PHURINE 8.5 06/26/2013 1200   GLUCOSEU 100 06/26/2013 1200   HGBUR MODERATE 06/26/2013 1200   BILIRUBINUR NEGATIVE 06/26/2013 1200   KETONESUR NEGATIVE 06/26/2013 1200   PROTEINUR >300 (A)  01/04/2010 1032   UROBILINOGEN 0.2 06/26/2013 1200   NITRITE NEGATIVE 06/26/2013 1200   LEUKOCYTESUR TRACE 06/26/2013 1200     Kathie Dike M.D. [facility] 08/03/2019, 8:37 PM  After 7pm go to [email or web address]   Call night coverage person covering after 7pm

## 2019-08-03 NOTE — Anesthesia Postprocedure Evaluation (Signed)
Anesthesia Post Note  Patient: [name]  Procedure(s) Performed: ENDOSCOPIC RETROGRADE CHOLANGIOPANCREATOGRAPHY (ERCP) (N/A ) STENT REMOVAL BILIARY STENT PLACEMENT REMOVAL OF STONES     Patient location during evaluation: Endoscopy Anesthesia Type: General Level of consciousness: awake and alert Pain management: pain level controlled Vital Signs Assessment: post-procedure vital signs reviewed and stable Respiratory status: spontaneous breathing, nonlabored ventilation, respiratory function stable and patient connected to nasal cannula oxygen Cardiovascular status: blood pressure returned to baseline and stable Postop Assessment: no apparent nausea or vomiting Anesthetic complications: no    Last Vitals:  Vitals:   08/03/19 1335 08/03/19 1337  BP: 121/61 (!) 143/73  Pulse: 70 71  Resp: 14 11  Temp:  36.9 C  SpO2: 94% 97%    Last Pain:  Vitals:   08/03/19 1337  TempSrc: Oral  PainSc: 0-No pain                 Ebbie Cherry,[place] DANIEL

## 2019-08-03 NOTE — Interval H&P Note (Signed)
History and Physical Interval Note:  08/03/2019 11:30 AM  [name]  has presented today for surgery, with the diagnosis of Sepsis , CBD stones.  The various methods of treatment have been discussed with the patient and family. After consideration of risks, benefits and other options for treatment, the patient has consented to  Procedure(s): ENDOSCOPIC RETROGRADE CHOLANGIOPANCREATOGRAPHY (ERCP) (N/A) as a surgical intervention.  The patient's history has been reviewed, patient examined, no change in status, stable for surgery.  I have reviewed the patient's chart and labs.  Questions were answered to the patient's satisfaction.     [name]  Assessment:  1.  Elevated liver enzymes. 2.  Abdominal pain. 3.  Recurrent cbd stones.  Recent ERCP August with bile duct stent placed.  Plan:  1.  ERCP with hopeful bile duct stent removal, bile duct stone extraction +/- further bile duct stone extraction. 2.  Risks (up to and including bleeding, infection, perforation, pancreatitis that can be complicated by infected necrosis and death), benefits (removal of stones, alleviating blockage, decreasing risk of cholangitis or choledocholithiasis-related pancreatitis), and alternatives (watchful waiting, percutaneous transhepatic cholangiography) of ERCP were explained to patient/family in detail and patient elects to proceed.

## 2019-08-03 NOTE — Anesthesia Procedure Notes (Signed)
Procedure Name: Intubation Date/Time: 08/03/2019 12:02 PM Performed by: Kathryne Hitch, CRNA Pre-anesthesia Checklist: Patient identified, Emergency Drugs available, Suction available and Patient being monitored Patient Re-evaluated:Patient Re-evaluated prior to induction Oxygen Delivery Method: Circle system utilized Preoxygenation: Pre-oxygenation with 100% oxygen Induction Type: IV induction Ventilation: Mask ventilation without difficulty Laryngoscope Size: Miller and 2 Grade View: Grade I Tube type: Oral Tube size: 7.5 mm Number of attempts: 1 Airway Equipment and Method: Stylet and Oral airway Placement Confirmation: ETT inserted through vocal cords under direct vision,  positive ETCO2 and breath sounds checked- equal and bilateral Secured at: 22 cm Tube secured with: Tape Dental Injury: Teeth and Oropharynx as per pre-operative assessment

## 2019-08-03 NOTE — Progress Notes (Signed)
Patient has home CPAP that he places on himself without assistance. RT instructed patient to have RT called if needed. RT will monitor as needed.

## 2019-08-03 NOTE — Plan of Care (Signed)
  Problem: Clinical Measurements: Goal: Ability to maintain clinical measurements within normal limits will improve Outcome: Progressing   Problem: Clinical Measurements: Goal: Respiratory complications will improve Outcome: Progressing   

## 2019-08-03 NOTE — Anesthesia Preprocedure Evaluation (Addendum)
Anesthesia Evaluation  Patient identified by MRN, date of birth, ID band Patient awake    Reviewed: Allergy & Precautions, NPO status , Patient's Chart, lab work & pertinent test results  History of Anesthesia Complications Negative for: history of anesthetic complications  Airway Mallampati: II  TM Distance: >3 FB Neck ROM: Full    Dental  (+) Poor Dentition, Dental Advisory Given   Pulmonary sleep apnea and Continuous Positive Airway Pressure Ventilation , former smoker,    Pulmonary exam normal        Cardiovascular hypertension, (-) angina+ CAD, + Past MI, + Cardiac Stents and + Peripheral Vascular Disease  Normal cardiovascular exam     Neuro/Psych PSYCHIATRIC DISORDERS Depression Dementia  Neuromuscular disease (peripheral neuropathy) CVA, No Residual Symptoms    GI/Hepatic Neg liver ROS, PUD, GERD  Controlled,  Endo/Other  diabetes, Well Controlled, Type 2 Obesity   Renal/GU ESRF and DialysisRenal disease     Musculoskeletal  (+) Arthritis ,  Gout    Abdominal   Peds  Hematology negative hematology ROS (+)   Anesthesia Other Findings   Reproductive/Obstetrics                            Anesthesia Physical  Anesthesia Plan  ASA: III  Anesthesia Plan: General   Post-op Pain Management:    Induction: Intravenous  PONV Risk Score and Plan: 2 and Treatment may vary due to age or medical condition, Ondansetron and Dexamethasone  Airway Management Planned: Oral ETT  Additional Equipment: None  Intra-op Plan:   Post-operative Plan: Extubation in OR  Informed Consent: I have reviewed the patients History and Physical, chart, labs and discussed the procedure including the risks, benefits and alternatives for the proposed anesthesia with the patient or authorized representative who has indicated his/her understanding and acceptance.     Dental advisory given  Plan  Discussed with: CRNA and Anesthesiologist  Anesthesia Plan Comments:       Anesthesia Quick Evaluation

## 2019-08-03 NOTE — Progress Notes (Addendum)
[facility] Progress Note   Subjective:   Patient seen and examined at bedside. Resting with CPAP on. Reports he is feeling well, happy that he got gabapentin last night. Denies SOB, orthopnea, CP, N/V/D. Reports he has mild abdominal pain today. Scheduled for ERCP.  Objective Vitals:   08/01/19 1742 08/01/19 2151 08/02/19 2129 08/03/19 0603  BP: 124/60 (!) 154/64 127/63 123/66  Pulse: 78 84 71 74  Resp: 19 16 14 14   Temp:  (!) 97.5 F (36.4 C) 98.3 F (36.8 C) 98.1 F (36.7 C)  TempSrc:  Oral Oral Oral  SpO2: 100% 100% 97% 96%  Weight:      Height:       Physical Exam General: Well developed, alert male in NAD Heart: RRR, no murmurs, rubs or gallops Lungs: CTA bilaterally without wheezing, rhonchi or rales Abdomen: Soft, non-tender, non-distended. +BS Extremities: No peripheral edema b/l lower extremities Dialysis Access: RUE AVF + thrill/bruit   Additional Objective Labs: Basic Metabolic Panel: Recent Labs  Lab 08/01/19 0231 08/02/19 0720 08/03/19 0253  NA 140 138 136  K 5.4* 4.1 4.2  CL 101 99 100  CO2 25 24 22   GLUCOSE 130* 74 91  BUN 51* 24* 33*  CREATININE 9.37* 6.07* 8.21*  CALCIUM 8.7* 8.0* 8.4*   Liver Function Tests: Recent Labs  Lab 07/31/19 1213 08/01/19 0231  AST 40 25  ALT 63* 48*  ALKPHOS 147* 132*  BILITOT 1.1 0.9  PROT 7.3 6.8  ALBUMIN 3.2* 2.9*   Recent Labs  Lab 07/31/19 1213  LIPASE 33   CBC: Recent Labs  Lab 07/31/19 1213 07/31/19 1251 08/01/19 0231  WBC 10.0  --  8.9  NEUTROABS 8.1*  --   --   HGB 13.5 10.2* 10.1*  HCT 44.2 30.0* 32.1*  MCV 98.9  --  97.0  PLT 209  --  216   Blood Culture    Component Value Date/Time   SDES BLOOD LEFT ANTECUBITAL 07/31/2019 1213   SDES BLOOD LEFT ANTECUBITAL 07/31/2019 1213   SPECREQUEST AEROBIC BOTTLE ONLY Blood Culture adequate volume 07/31/2019 1213   SPECREQUEST  07/31/2019 1213    BOTTLES DRAWN AEROBIC AND ANAEROBIC Blood Culture adequate volume   CULT   07/31/2019 1213    NO GROWTH 3 DAYS Performed at [facility], [place] [address], [place], [place] [address]    CULT  07/31/2019 1213    NO GROWTH 3 DAYS Performed at [facility], [place] [address], [place], [place] [address]    REPTSTATUS PENDING 07/31/2019 1213   REPTSTATUS PENDING 07/31/2019 1213   CBG: Recent Labs  Lab 08/02/19 1707 08/02/19 2038 08/03/19 0025 08/03/19 0423 08/03/19 0732  GLUCAP 85 120* 83 78 77   Medications: . sodium chloride    . [MAR Hold] ceFEPime (MAXIPIME) IV 1 g (08/02/19 1112)  . [MAR Hold] metronidazole 500 mg (08/03/19 0515)   . [MAR Hold] allopurinol  100 mg Oral Daily  . [MAR Hold] aspirin EC  325 mg Oral Daily  . [MAR Hold] cinacalcet  90 mg Oral Q supper  . [MAR Hold] darbepoetin (ARANESP) injection - DIALYSIS  40 mcg Intravenous Q Sat-HD  . [MAR Hold] doxercalciferol  3 mcg Intravenous Q T,Th,Sa-HD  . [MAR Hold] ezetimibe  10 mg Oral QPM  . [MAR Hold] ferric citrate  630 mg Oral TID WC  . [MAR Hold] gabapentin  300 mg Oral BID  . [MAR Hold] heparin  5,000 Units Subcutaneous Q8H  . [  MAR Hold] insulin aspart  0-9 Units Subcutaneous Q4H  . [MAR Hold] methimazole  2.5 mg Oral Daily  . [MAR Hold] midodrine  10 mg Oral Q T,Th,Sa-HD  . [MAR Hold] multivitamin  1 tablet Oral Daily  . [MAR Hold] pantoprazole  40 mg Oral QHS    Dialysis Orders: Center:[facility]. RUE AVF; T 4hr 61min; BFR 450, DFR 800; EDW 102kg; 3K/ 2.25 Ca  No heparin Mircera 60 mcg IV q 2 weeks (last dose 75 mcg on 06/09/2019) Venofer 100mg  q HD (stop date 08/06/2019) [name] 3 mcg q HD Midodrine 10mg  PO before HD Auryxia 210mg  2 tabs TID AC Sensipar 90 mg PO BID   Assessment/Plan: 1. Sepsis: CXR unremarkable, CT abdomen shows no clear etiology. Ongoing abdominal tenderness and hx of gallstones. On vancomycin, flagyl and cefepime. Cultures pending. GI is following and planing ERCP today. 2. Neuropathy: Patient reports taking gabapentin 600mg  daily as  outpatient and tolerates this well. Discussed with Dr. Jonnie Finner, recommend max daily dose of 600mg  as he is a dialysis patient. Rx changed to 300mg  BID. Patient reports pain is well controlled today.  3. Encephalopathy/weakness:Resolved. Ct head unremarkable. Hyperkalemia likely contributed to weakness.  4. Hyperkalemia: K+ 6.8 on admission, now 4.2 after dialysis on 10/31. Had recently been changed to [place] bath with HD, change back to 2K. If K+ remains elevated, consider evaluating fistula to rule out recirculation. Low K+ diet. 5. ESRD:Continue TTS schedule, will plan for next HD on 08/04/2019.  6. Hypertension/volume:BP stable. On midodrine pre-HD. No evidence of volume overload. 1.3 kg above EDW by weights here. UF goal 2-2.5L with HD as tolerated. 7. Anemia:Hgb 10.1. Receiving course of venofer as an outpatient, hold for now due to sepsis. Start aranesp.  8. Metabolic bone disease:Calcium 9.3 corrected. Continue hectorol, sensipar and [place]. 9. Nutrition:Renal diet with fluid restrictions.  Anice Paganini, PA-C 08/03/2019, 10:43 AM  [facility] Pager: [phone]   I have seen and examined this patient and agree with plan and assessment in the above note with renal recommendations/intervention highlighted.  Pt is s/p ERCP with removal of one stent along with multiple stone extractions, and new cbd stent placement.  Some stones remain. Broadus John A Clarence Dunsmore,MD 08/03/2019 2:07 PM

## 2019-08-03 NOTE — Transfer of Care (Signed)
Immediate Anesthesia Transfer of Care Note  Patient: [name]  Procedure(s) Performed: ENDOSCOPIC RETROGRADE CHOLANGIOPANCREATOGRAPHY (ERCP) (N/A ) STENT REMOVAL BILIARY STENT PLACEMENT REMOVAL OF STONES  Patient Location: Endoscopy Unit  Anesthesia Type:General  Level of Consciousness: drowsy and patient cooperative  Airway & Oxygen Therapy: Patient Spontanous Breathing  Post-op Assessment: Report given to RN and Post -op Vital signs reviewed and stable  Post vital signs: Reviewed and stable  Last Vitals:  Vitals Value Taken Time  BP 111/50 08/03/19 1304  Temp    Pulse 77 08/03/19 1304  Resp 9 08/03/19 1304  SpO2 90 % 08/03/19 1304  Vitals shown include unvalidated device data.  Last Pain:  Vitals:   08/03/19 1044  TempSrc: Temporal  PainSc: 0-No pain         Complications: No apparent anesthesia complications

## 2019-08-03 NOTE — Op Note (Signed)
[facility] Patient Name: [name] Procedure Date : 08/03/2019 MRN: [id number] Attending MD: Arta Silence , MD Date of Birth: 11-Jan-1951 CSN: [id number] Age: 68 Admit Type: Inpatient Procedure:                ERCP Indications:              Common bile duct stone(s), Abdominal pain of                            suspected biliary origin, Elevated liver enzymes Providers:                Arta Silence, MD, Vista Lawman, RN, Janie                            Billups, Patton Salles CRNA Referring MD:              Medicines:                General Anesthesia, cefipime 1 g IV, Indomethacin                            174 mg PR Complications:            No immediate complications. Estimated Blood Loss:     Estimated blood loss: none. Procedure:                Pre-Anesthesia Assessment:                           - Prior to the procedure, a History and Physical                            was performed, and patient medications and                            allergies were reviewed. The patient's tolerance of                            previous anesthesia was also reviewed. The risks                            and benefits of the procedure and the sedation                            options and risks were discussed with the patient.                            All questions were answered, and informed consent                            was obtained. Prior Anticoagulants: The patient has                            taken no previous anticoagulant or antiplatelet  agents. ASA Grade Assessment: III - A patient with                            severe systemic disease. After reviewing the risks                            and benefits, the patient was deemed in                            satisfactory condition to undergo the procedure.                           After obtaining informed consent, the scope was                            passed under  direct vision. Throughout the                            procedure, the patient's blood pressure, pulse, and                            oxygen saturations were monitored continuously. The                            [id number]-Q180V ([id number] Olympus duodenoscope was                            introduced through the mouth, and used to inject                            contrast into and used to cannulate the bile duct.                            The ERCP was accomplished without difficulty. The                            patient tolerated the procedure well. Scope In: Scope Out: Findings:      A biliary stent was visible on the scout film. One stent was removed       from the common bile duct using a snare. The bile duct was deeply       cannulated with the short-nosed traction sphincterotome. Contrast was       injected. I personally interpreted the bile duct images. Ductal flow of       contrast was adequate. The main bile duct contained multiple filling       defects consistent with stones, ranging in size from few mm up to about       1cm. The biliary tree was swept with a 15 mm balloon starting at the       upper third of the main bile duct, middle third of the main bile duct,       lower third of the main duct, left intrahepatic duct(s) and right       intrahepatic duct(s). Many stones were removed. Many stones remained.       The  biliary tree was then swept with a basket starting at the upper       third of the main bile duct, middle third of the main bile duct, lower       third of the main duct, left intrahepatic duct(s) and right intrahepatic       duct(s). Many additional stones were removed, but a few stones remained.       There was also suboptimal drainage of bile through ampulla. According,       to complete procedure, one 8.5 Fr by 5 cm temporary stent with a single       external flap and a single internal flap was placed 5 cm into the common       bile duct. Bile flowed through  the stent. The stent was in good       position. Pancreatogram was not obtained (intentionally). Impression:               - Multiple residual bile duct stones.                           - Choledocholithiasis was found. Many stones, over                            10, removed,but was some stones remain; a plastic                            bile duct stent was inserted.                           - One stent was removed from the common bile duct.                           - The biliary tree was swept.                           - The biliary tree was swept.                           - One temporary stent was placed into the common                            bile duct. Moderate Sedation:      None Recommendation:           - Return patient to hospital ward for ongoing care.                           - Clear liquid diet today.                           - Continue present medications.                           [facility] will follow.                           - Will need repeat ERCP as outpatient in a few  weeks with Dr. Watt Climes, with availability of Spyglass                            (cholangioscopy). Procedure Code(s):        --- Professional ---                           [phone], Endoscopic retrograde                            cholangiopancreatography (ERCP); with removal and                            exchange of stent(s), biliary or pancreatic duct,                            including pre- and post-dilation and guide wire                            passage, when performed, including sphincterotomy,                            when performed, each stent exchanged                           [id number], Endoscopic retrograde                            cholangiopancreatography (ERCP); with removal of                            calculi/debris from biliary/pancreatic duct(s) Diagnosis Code(s):        --- Professional ---                           K80.50, Calculus of bile  duct without cholangitis                            or cholecystitis without obstruction                           Z46.59, Encounter for fitting and adjustment of                            other gastrointestinal appliance and device                           R10.9, Unspecified abdominal pain                           R74.8, Abnormal levels of other serum enzymes                           R93.2, Abnormal findings on diagnostic imaging of                            liver and  biliary tract CPT copyright 2019 American Medical Association. All rights reserved. The codes documented in this report are preliminary and upon coder review may  be revised to meet current compliance requirements. Arta Silence, MD 08/03/2019 1:00:20 PM This report has been signed electronically. Number of Addenda: 0

## 2019-08-04 LAB — GLUCOSE, CAPILLARY
Glucose-Capillary: 108 mg/dL — ABNORMAL HIGH (ref 70–99)
Glucose-Capillary: 163 mg/dL — ABNORMAL HIGH (ref 70–99)
Glucose-Capillary: 192 mg/dL — ABNORMAL HIGH (ref 70–99)
Glucose-Capillary: 87 mg/dL (ref 70–99)
Glucose-Capillary: 89 mg/dL (ref 70–99)
Glucose-Capillary: 92 mg/dL (ref 70–99)

## 2019-08-04 LAB — BASIC METABOLIC PANEL
Anion gap: 19 — ABNORMAL HIGH (ref 5–15)
BUN: 48 mg/dL — ABNORMAL HIGH (ref 8–23)
CO2: 20 mmol/L — ABNORMAL LOW (ref 22–32)
Calcium: 8.3 mg/dL — ABNORMAL LOW (ref 8.9–10.3)
Chloride: 98 mmol/L (ref 98–111)
Creatinine, Ser: 10.25 mg/dL — ABNORMAL HIGH (ref 0.61–1.24)
GFR calc Af Amer: 5 mL/min — ABNORMAL LOW (ref >60)
GFR calc non Af Amer: 5 mL/min — ABNORMAL LOW (ref >60)
Glucose, Bld: 127 mg/dL — ABNORMAL HIGH (ref 70–99)
Potassium: 4.7 mmol/L (ref 3.5–5.1)
Sodium: 137 mmol/L (ref 135–145)

## 2019-08-04 LAB — HEPATIC FUNCTION PANEL
ALT: 26 U/L (ref 0–44)
AST: 30 U/L (ref 15–41)
Albumin: 2.3 g/dL — ABNORMAL LOW (ref 3.5–5.0)
Alkaline Phosphatase: 99 U/L (ref 38–126)
Bilirubin, Direct: <0.1 mg/dL (ref 0.0–0.2)
Total Bilirubin: 0.9 mg/dL (ref 0.3–1.2)
Total Protein: 5.7 g/dL — ABNORMAL LOW (ref 6.5–8.1)

## 2019-08-04 LAB — HEPATITIS B SURFACE ANTIGEN: Hepatitis B Surface Ag: NONREACTIVE

## 2019-08-04 LAB — PHOSPHORUS: Phosphorus: 6.9 mg/dL — ABNORMAL HIGH (ref 2.5–4.6)

## 2019-08-04 MED ORDER — POLYVINYL ALCOHOL 1.4 % OP SOLN
1.0000 [drp] | OPHTHALMIC | Status: DC | PRN
  Administered 2019-08-05: 1 [drp] via OPHTHALMIC
  Filled 2019-08-04: qty 15

## 2019-08-04 MED ORDER — DOXERCALCIFEROL 4 MCG/2ML IV SOLN
INTRAVENOUS | Status: AC
  Administered 2019-08-04: 3 ug via INTRAVENOUS
  Filled 2019-08-04: qty 2

## 2019-08-04 MED ORDER — MIDODRINE HCL 5 MG PO TABS
ORAL_TABLET | ORAL | Status: AC
  Administered 2019-08-04: 10 mg via ORAL
  Filled 2019-08-04: qty 2

## 2019-08-04 NOTE — Progress Notes (Signed)
Pharmacy Antibiotic Note  [name] is a 68 y.o. male with PMH of DM, ESRD on HD TuThSat, CAD, CHF, dementia, depression, who presents after weakness and AMS. Pharmacy was consulted 07/31/19 for vancomycin and cefepime dosing for sepsis.  Vancomycin discontinued 10/31 per MD.  Cefepime continues.  Also on IV flagyl per MD. HD -TTS, patient in Hemo for HD now 08/04/19.  WBC is wnls at 8.9,  Afebrile, Tmax 98.6 Bllod Cx: ngtd x4days  Plan: Continue Cefepime IV 1g q24 hours Vancomycin discontinued 10/31 per MD Continue Flagyl per MD Monitor WBC, Temp, and clinical status  Height: 6\' 1"  (185.4 cm) Weight: 232 lb 9.4 oz (105.5 kg) IBW/kg (Calculated) : 79.9  Temp (24hrs), Avg:98 F (36.7 C), Min:97.3 F (36.3 C), Max:98.6 F (37 C)  Recent Labs  Lab 07/31/19 1213  07/31/19 1722 08/01/19 0231 08/02/19 0720 08/03/19 0253 08/04/19 0228  WBC 10.0  --   --  8.9  --   --   --   CREATININE 8.24*   < > 8.48* 9.37* 6.07* 8.21* 10.25*  LATICACIDVEN 2.0*  --  1.2  --   --   --   --    < > = values in this interval not displayed.    Estimated Creatinine Clearance: 8.5 mL/min (A) (by C-G formula based on SCr of 10.25 mg/dL (H)).    Allergies  Allergen Reactions  . Statins Other (See Comments)    Weak muscles  . Ciprofloxacin Rash    Antimicrobials this admission: Vancomycin 10/30 >> 10/31 Cefepime 10/30 >>  Flagyl 10/30 >>  Microbiology results: 10/30 BCx: ngtd x4days 10/30 COVID: negative  Thank you for allowing pharmacy to be a part of this patient's care.   Nicole Cella, RPh Clinical Pharmacist Please check AMION for all [place] phone numbers After 10:00 PM, call [place] [phone] 08/04/2019 10:29 AM

## 2019-08-04 NOTE — Plan of Care (Signed)
  Problem: Education: Goal: Knowledge of General Education information will improve Description: Including pain rating scale, medication(s)/side effects and non-pharmacologic comfort measures Outcome: Progressing   Problem: Health Behavior/Discharge Planning: Goal: Ability to manage health-related needs will improve Outcome: Progressing   Problem: Clinical Measurements: Goal: Ability to maintain clinical measurements within normal limits will improve Outcome: Progressing Goal: Will remain free from infection Outcome: Progressing Goal: Diagnostic test results will improve Outcome: Progressing Goal: Respiratory complications will improve Outcome: Progressing Goal: Cardiovascular complication will be avoided Outcome: Progressing   Problem: Activity: Goal: Risk for activity intolerance will decrease Outcome: Progressing   Problem: Elimination: Goal: Will not experience complications related to bowel motility Outcome: Progressing Goal: Will not experience complications related to urinary retention Outcome: Progressing   Problem: Pain Managment: Goal: General experience of comfort will improve Outcome: Progressing   Problem: Safety: Goal: Ability to remain free from injury will improve Outcome: Progressing   

## 2019-08-04 NOTE — Progress Notes (Signed)
[facility] Progress Note  [name] 68 y.o. [birth year]  CC: Choledocholithiasis   Subjective: Status post ERCP with removal of multiple CBD stones yesterday.  Still some stones remains inside the CBD and hence plastic bile duct stent was placed.  Patient denies any abdominal pain.  Complaining of loose stools .  Seen and examined in dialysis unit.  ROS : Negative for chest pain and shortness of breath   Objective: Vital signs in last 24 hours: Vitals:   08/04/19 1000 08/04/19 1030  BP: 132/75 128/71  Pulse: 77 73  Resp:    Temp:    SpO2:      Physical Exam:  General.  Resting comfortably in the bed currently in the dialysis unit. Abdomen.  Soft, nontender, nondistended, bowel sounds present Neuro.  Alert/oriented x3 Psych.  Mood and affect normal  Lab Results: Recent Labs    08/03/19 0253 08/04/19 0228  NA 136 137  K 4.2 4.7  CL 100 98  CO2 22 20*  GLUCOSE 91 127*  BUN 33* 48*  CREATININE 8.21* 10.25*  CALCIUM 8.4* 8.3*  PHOS  --  6.9*   No results for input(s): AST, ALT, ALKPHOS, BILITOT, PROT, ALBUMIN in the last 72 hours. No results for input(s): WBC, NEUTROABS, HGB, HCT, MCV, PLT in the last 72 hours. No results for input(s): LABPROT, INR in the last 72 hours.    Assessment/Plan: -Choledocholithiasis.Status post ERCP with removal of multiple CBD stones yesterday.  Still some stones remains inside the CBD and hence plastic bile duct stent was placed. -Abnormal LFTs.  Secondary to above. -End-stage renal disease on hemodialysis  Recommendations --------------------------- -Recheck LFTs today -Advance diet to renal diet -If remains asymptomatic after advancing diet, okay to discharge from GI standpoint. -He will need a repeat ERCP with spyglass with Dr. Watt Climes  in 4 to 6 weeks for removal of residual CBD stones. -GI will sign off.  Call us back if needed  Otis Brace MD, [place] 08/04/2019, 11:29 AM  Contact #  [phone]

## 2019-08-04 NOTE — Progress Notes (Signed)
[facility]                                                                              Patient Demographics  [name], is a 68 y.o. male, DOB - July 01, 1951, BCW:[id number]  Admit date - 07/31/2019   Admitting Physician Alma Friendly, MD  Outpatient Primary MD for the patient is Biagio Borg, MD  Outpatient specialists:   LOS - 4  days    No chief complaint on file.      Brief summary   [name] is a 68 y.o. male with medical history significant for  but not limited to dementia, diabetes mellitus type 2, ESRD on HD T/TH/S, CAD, CHF, presenting with few days history of generalized weakness and mental status change associated with fever of 100.4, mild tachycardia, hypotensive. COVID-19 was negative, no acute infiltrate on chest x-ray.  Patient started on IV antibiotics admitted for early sepsis.  Assessment & Plan    Principal Problem:   Sepsis ([place]) Active Problems:   Gout   Dementia ([name])   ESRD on hemodialysis ([name])   Essential hypertension  Sepsis likely secondary to cholangitis Presents with tachycardia, hypotension, elevated lactic acid level Source unclear, high risk for cholangitis given history of multiple gallstones with multiple ERCPs in the past Blood cultures show no growth Continue broad-spectrum IV antibiotics Seen by GI, and patient underwent ERCP with multiple stones removed and old stent removed. New biliary stent was placed LFTs today show improvement Diet has been advanced to renal diet which she appears to be tolerating  Metabolic encephalopathy, acute CT head negative Improving, follow-up   End-stage renal disease Routine hemodialysis Monitor electrolytes and renal function per nephrology After dialysis today, patient felt extremely weak and had difficulty ambulating.  Blood pressure was noted to be in the low 90s to high 80s.  He does not feel as though he is able to discharge home.  Will monitor overnight and  if stable, can likely discharge home in the morning    Chronic systolic/diastolic HF Appears euvolemic TEE done on 7/20 showed EF of 60 to 65% Volume status managed with HD No longer on ACE/ARB's, BB due to low BP with ESRD  CAD status post stent to RCA in 2008, recurrent NSTEMI Chest pain free Continue aspirin No longer on Plavix due to rectal bleeding from possible radiation proctitis Continue Zetia, [facility]-9  Diabetes mellitus type 2 Last A1c on 05/2019 was 6 Diet-controlled SSI, Accu-Cheks, hypoglycemic protocol Blood sugars stable  Hyperthyroidism TSH, free T4 normal range Continue methimazole  COPD On O2 at night  OSA on CPAP CPAP ordered QHS   Code Status: Full code DVT Prophylaxis:   heparin  Family Communication: Discussed in detail with the patient, all imaging results, lab results explained to the patient  Disposition Plan: Discharge home in a.m. if blood pressures are stable patient is feeling improved  Time Spent in minutes 35 minutes  Procedures:  ERCP 11/2  Consultants:   GI Nephrology  Antimicrobials:   Cefepime  Metronidazole   Medications  Scheduled Meds:  allopurinol  100 mg Oral Daily   aspirin EC  325 mg Oral Daily   Chlorhexidine Gluconate Cloth  6 each Topical Q0600   cinacalcet  90 mg Oral Q supper   darbepoetin (ARANESP) injection - DIALYSIS  40 mcg Intravenous Q Sat-HD   doxercalciferol  3 mcg Intravenous Q T,Th,Sa-HD   ezetimibe  10 mg Oral QPM   ferric citrate  630 mg Oral TID WC   gabapentin  300 mg Oral BID   heparin  5,000 Units Subcutaneous Q8H   insulin aspart  0-9 Units Subcutaneous Q4H   methimazole  2.5 mg Oral Daily   midodrine  10 mg Oral Q T,Th,Sa-HD   multivitamin  1 tablet Oral Daily   pantoprazole  40 mg Oral QHS   Continuous Infusions:  ceFEPime (MAXIPIME) IV 1 g (08/04/19 1446)   metronidazole 500 mg (08/04/19 1524)   PRN Meds:.acetaminophen **OR** acetaminophen, albuterol,  diphenhydrAMINE, HYDROcodone-acetaminophen, hydroxypropyl methylcellulose / hypromellose, loperamide, ondansetron **OR** ondansetron (ZOFRAN) IV, polyethylene glycol, polyvinyl alcohol   Antibiotics   Anti-infectives (From admission, onward)   Start     Dose/Rate Route Frequency Ordered Stop   08/01/19 0600  ceFEPIme (MAXIPIME) 1 g in sodium chloride 0.9 % 100 mL IVPB  Status:  Discontinued     1 g 200 mL/hr over 30 Minutes Intravenous Every 8 hours 07/31/19 1738 07/31/19 2038   08/01/19 0000  vancomycin (VANCOCIN) 2,500 mg in sodium chloride 0.9 % 500 mL IVPB  Status:  Discontinued     2,500 mg 250 mL/hr over 120 Minutes Intravenous Every 24 hours 07/31/19 1738 07/31/19 1742   07/31/19 2200  metroNIDAZOLE (FLAGYL) IVPB 500 mg     500 mg 100 mL/hr over 60 Minutes Intravenous Every 8 hours 07/31/19 1737     07/31/19 1745  vancomycin (VANCOCIN) 2,500 mg in sodium chloride 0.9 % 500 mL IVPB  Status:  Discontinued     2,500 mg 250 mL/hr over 120 Minutes Intravenous Every 24 hours 07/31/19 1737 07/31/19 1738   07/31/19 1745  ceFEPIme (MAXIPIME) 1 g in sodium chloride 0.9 % 100 mL IVPB  Status:  Discontinued     1 g 200 mL/hr over 30 Minutes Intravenous Every 8 hours 07/31/19 1737 07/31/19 1738   07/31/19 1741  vancomycin variable dose per unstable renal function (pharmacist dosing)  Status:  Discontinued      Does not apply See admin instructions 07/31/19 1742 08/01/19 1349   07/31/19 1345  ceFEPIme (MAXIPIME) 1 g in sodium chloride 0.9 % 100 mL IVPB     1 g 200 mL/hr over 30 Minutes Intravenous  Once 07/31/19 1332 07/31/19 1445   07/31/19 1130  ceFEPIme (MAXIPIME) 2 g in sodium chloride 0.9 % 100 mL IVPB  Status:  Discontinued     2 g 200 mL/hr over 30 Minutes Intravenous  Once 07/31/19 1118 07/31/19 1123   07/31/19 1130  metroNIDAZOLE (FLAGYL) IVPB 500 mg  Status:  Discontinued     500 mg 100 mL/hr over 60 Minutes Intravenous  Once 07/31/19 1118 07/31/19 1737   07/31/19 1130  vancomycin  (VANCOCIN) IVPB 1000 mg/200 mL premix  Status:  Discontinued     1,000 mg 200 mL/hr over 60 Minutes Intravenous  Once 07/31/19 1118 07/31/19 1121   07/31/19 1130  vancomycin (VANCOCIN) 2,500 mg in sodium chloride 0.9 % 500 mL IVPB     2,500 mg 250 mL/hr over 120 Minutes Intravenous  Once 07/31/19 1121 07/31/19 1840   07/31/19 1130  ceFEPIme (MAXIPIME) 1 g in sodium chloride 0.9 % 100 mL IVPB  1 g 200 mL/hr over 30 Minutes Intravenous Every 24 hours 07/31/19 1123          Subjective:   Sabino Dick no nausea or vomiting.  No abdominal pain.  Feels very weak.  Undergoing dialysis at this time.  Objective:   Vitals:   08/04/19 1315 08/04/19 1341 08/04/19 1617 08/04/19 1703  BP: 107/61 112/60 (!) 88/50 (!) 91/51  Pulse: 75 75 85   Resp:  16 16   Temp:  98 F (36.7 C) 98.4 F (36.9 C)   TempSrc:  Oral Oral   SpO2:  97% 93%   Weight:      Height:        Intake/Output Summary (Last 24 hours) at 08/04/2019 2011 Last data filed at 08/04/2019 1600 Gross per 24 hour  Intake 469.82 ml  Output 1852 ml  Net -1382.18 ml     Wt Readings from Last 3 Encounters:  08/04/19 105.5 kg  07/08/19 102.9 kg  07/03/19 102.5 kg     Exam General exam: Alert, awake, oriented x 3 Respiratory system: Clear to auscultation. Respiratory effort normal. Cardiovascular system:RRR. No murmurs, rubs, gallops. Gastrointestinal system: Abdomen is nondistended, soft and nontender. No organomegaly or masses felt. Normal bowel sounds heard. Central nervous system: Alert and oriented. No focal neurological deficits. Extremities: No C/C/E, +pedal pulses Skin: No rashes, lesions or ulcers Psychiatry: Judgement and insight appear normal. Mood & affect appropriate.    Data Reviewed:  I have personally reviewed following labs and imaging studies  Micro Results Recent Results (from the past 240 hour(s))  Blood Culture (routine x 2)     Status: None (Preliminary result)   Collection Time: 07/31/19  12:13 PM   Specimen: BLOOD  Result Value Ref Range Status   Specimen Description BLOOD LEFT ANTECUBITAL  Final   Special Requests AEROBIC BOTTLE ONLY Blood Culture adequate volume  Final   Culture   Final    NO GROWTH 4 DAYS Performed at [facility], [address], [place], [place] [address]    Report Status PENDING  Incomplete  Blood Culture (routine x 2)     Status: None (Preliminary result)   Collection Time: 07/31/19 12:13 PM   Specimen: BLOOD  Result Value Ref Range Status   Specimen Description BLOOD LEFT ANTECUBITAL  Final   Special Requests   Final    BOTTLES DRAWN AEROBIC AND ANAEROBIC Blood Culture adequate volume   Culture   Final    NO GROWTH 4 DAYS Performed at [facility], [place] [address] [place], [place] [address]    Report Status PENDING  Incomplete  SARS Coronavirus 2 by RT PCR (hospital order, performed in [facility] hospital lab) Nasopharyngeal Nasopharyngeal Swab     Status: None   Collection Time: 07/31/19  1:25 PM   Specimen: Nasopharyngeal Swab  Result Value Ref Range Status   SARS Coronavirus 2 NEGATIVE NEGATIVE Final    Comment: (NOTE) If result is NEGATIVE SARS-CoV-2 target nucleic acids are NOT DETECTED. The SARS-CoV-2 RNA is generally detectable in upper and lower  respiratory specimens during the acute phase of infection. The lowest  concentration of SARS-CoV-2 viral copies this assay can detect is 250  copies / mL. A negative result does not preclude SARS-CoV-2 infection  and should not be used as the sole basis for treatment or other  patient management decisions.  A negative result may occur with  improper specimen collection / handling, submission of specimen other  than  nasopharyngeal swab, presence of viral mutation(s) within the  areas targeted by this assay, and inadequate number of viral copies  (<250 copies / mL). A negative result must be combined with clinical  observations, patient history, and epidemiological  information. If result is POSITIVE SARS-CoV-2 target nucleic acids are DETECTED. The SARS-CoV-2 RNA is generally detectable in upper and lower  respiratory specimens dur ing the acute phase of infection.  Positive  results are indicative of active infection with SARS-CoV-2.  Clinical  correlation with patient history and other diagnostic information is  necessary to determine patient infection status.  Positive results do  not rule out bacterial infection or co-infection with other viruses. If result is PRESUMPTIVE POSTIVE SARS-CoV-2 nucleic acids MAY BE PRESENT.   A presumptive positive result was obtained on the submitted specimen  and confirmed on repeat testing.  While 2019 novel coronavirus  (SARS-CoV-2) nucleic acids may be present in the submitted sample  additional confirmatory testing may be necessary for epidemiological  and / or clinical management purposes  to differentiate between  SARS-CoV-2 and other Sarbecovirus currently known to infect humans.  If clinically indicated additional testing with an alternate test  methodology [phone]) is advised. The SARS-CoV-2 RNA is generally  detectable in upper and lower respiratory sp ecimens during the acute  phase of infection. The expected result is Negative. Fact Sheet for Patients:  StrictlyIdeas.no Fact Sheet for Healthcare Providers: BankingDealers.co.za This test is not yet approved or cleared by the [place] FDA and has been authorized for detection and/or diagnosis of SARS-CoV-2 by FDA under an Emergency Use Authorization (EUA).  This EUA will remain in effect (meaning this test can be used) for the duration of the COVID-19 declaration under Section 564(b)(1) of the Act, 21 U.S.C. section 360bbb-3(b)(1), unless the authorization is terminated or revoked sooner. Performed at [facility], [place] [address] [place], [place] [address]     Radiology Reports Ct  Head Wo Contrast  Result Date: 07/31/2019 CLINICAL DATA:  Encephalopathy EXAM: CT HEAD WITHOUT CONTRAST TECHNIQUE: Contiguous axial images were obtained from the base of the skull through the vertex without intravenous contrast. COMPARISON:  10/04/2018 FINDINGS: Brain: Stable atrophy and chronic white matter microvascular ischemic changes throughout both cerebral hemispheres. No acute intracranial hemorrhage, new mass lesion, definite infarction, midline shift, herniation, hydrocephalus, or extra-axial fluid collection. No focal mass effect or edema. Cisterns are patent. Minor cerebellar atrophy. Vascular: No hyperdense vessel or unexpected calcification. Skull: Normal. Negative for fracture or focal lesion. Sinuses/Orbits: No acute finding. Other: None. IMPRESSION: Stable atrophy and chronic white matter microvascular ischemic changes. No interval change or acute process by noncontrast CT. Electronically Signed   By: Jerilynn Mages.  Shick M.D.   On: 07/31/2019 15:27   Dg Chest Portable 1 View  Result Date: 07/31/2019 CLINICAL DATA:  Chest pain, fever, dialysis patient. EXAM: PORTABLE CHEST 1 VIEW COMPARISON:  07/03/2019 FINDINGS: Borderline enlarged cardiac silhouette. The aorta remains tortuous and calcified. Stable linear scarring in the left mid lung zone. Mildly prominent interstitial markings with improvement. Cervical spine fixation hardware. Right shoulder degenerative changes. IMPRESSION: 1. No acute abnormality. 2. Stable borderline cardiomegaly. 3. Mild chronic interstitial lung disease. Electronically Signed   By: Claudie Revering M.D.   On: 07/31/2019 11:49   Dg Ercp  Result Date: 08/03/2019 CLINICAL DATA:  68 year old male with a history of choledocholithiasis EXAM: ERCP TECHNIQUE: Multiple spot images obtained with the fluoroscopic device and submitted for interpretation post-procedure. FLUOROSCOPY TIME:  Fluoroscopy Time:  6  minutes 6 seconds COMPARISON:  None FINDINGS: Limited intraoperative  fluoroscopic spot images during ERCP. Initial image demonstrates endoscope projecting over the upper abdomen. There is then cannulation of the ampulla and retrograde infusion of contrast, with incomplete opacification of the extrahepatic biliary system. Final image demonstrates placement of a plastic biliary stent. Changes of cholecystectomy IMPRESSION: Limited images during ERCP demonstrates incomplete opacification of the extrahepatic biliary system with placement of a plastic biliary stent. Changes of prior cholecystectomy. Please refer to the dictated operative report for full details of intraoperative findings and procedure. Electronically Signed   By: Corrie Mckusick D.O.   On: 08/03/2019 13:15   Vas [place] Burnard Bunting With/wo Tbi  Result Date: 07/08/2019 LOWER EXTREMITY DOPPLER STUDY Indications: Claudication, ulceration, and peripheral artery disease. High Risk Factors: Hypertension, hyperlipidemia, Diabetes, past history of                    smoking, prior MI, coronary artery disease.  Performing Technologist: Burley Saver RVT  Examination Guidelines: A complete evaluation includes at minimum, Doppler waveform signals and systolic blood pressure reading at the level of bilateral brachial, anterior tibial, and posterior tibial arteries, when vessel segments are accessible. Bilateral testing is considered an integral part of a complete examination. Photoelectric Plethysmograph (PPG) waveforms and toe systolic pressure readings are included as required and additional duplex testing as needed. Limited examinations for reoccurring indications may be performed as noted.  ABI Findings: +---------+------------------+-----+-------------------+--------+  Right     Rt Pressure (mmHg) Index Waveform            Comment   +---------+------------------+-----+-------------------+--------+  Brachial                                               AVF       +---------+------------------+-----+-------------------+--------+  PTA       69                  0.51  dampened monophasic           +---------+------------------+-----+-------------------+--------+  DP        66                 0.49  dampened monophasic           +---------+------------------+-----+-------------------+--------+  Great Toe 44                 0.32  Abnormal                      +---------+------------------+-----+-------------------+--------+ +---------+------------------+-----+-------------------+-------+  Left      Lt Pressure (mmHg) Index Waveform            Comment  +---------+------------------+-----+-------------------+-------+  Brachial  136                                                   +---------+------------------+-----+-------------------+-------+  PTA       51                 0.38  dampened monophasic          +---------+------------------+-----+-------------------+-------+  DP        61  0.45  dampened monophasic          +---------+------------------+-----+-------------------+-------+  Great Toe 26                 0.19  Abnormal                     +---------+------------------+-----+-------------------+-------+ +-------+-----------+-----------+------------+------------+  ABI/TBI Today's ABI Today's TBI Previous ABI Previous TBI  +-------+-----------+-----------+------------+------------+  Right   0.51        0.32        0.78         0.61          +-------+-----------+-----------+------------+------------+  Left    0.45        0.19        0.50         0.40          +-------+-----------+-----------+------------+------------+ Right ABIs and TBIs appear decreased compared to prior study on 05/07/2018. Left TBIs appear decreased compared to prior study on 05/07/2018. Left ABIs appear slightly decreased when compared to previous study.  Summary: Right: Resting right ankle-brachial index indicates moderate right lower extremity arterial disease. The right toe-brachial index is abnormal. RT great toe pressure = 44 mmHg. PPG tracings appear dampened. Left:  Resting left ankle-brachial index indicates severe left lower extremity arterial disease. The left toe-brachial index is abnormal. LT Great toe pressure = 26 mmHg. PPG tracings appear dampened.  *See table(s) above for measurements and observations.  Electronically signed by Ruta Hinds MD on 07/08/2019 at 1:51:45 PM.    Final    Ct Renal Stone Study  Result Date: 07/31/2019 CLINICAL DATA:  Abdominal pain. EXAM: CT ABDOMEN AND PELVIS WITHOUT CONTRAST TECHNIQUE: Multidetector CT imaging of the abdomen and pelvis was performed following the standard protocol without IV contrast. COMPARISON:  11/11/2018 FINDINGS: Lower chest: Minimal bilateral dependent atelectasis or scarring, without significant change. Stable 6 mm partially calcified subpleural nodule in the lateral aspect of the right upper lobe, anteriorly, on image number 7 series 5. Borderline enlarged heart. Atheromatous coronary artery calcifications. Hepatobiliary: Intrahepatic biliary air is again demonstrated with stable mild dilatation of the intrahepatic ducts. A biliary stent extending from the proximal common duct into the duodenum is currently demonstrated without the previously demonstrated extrahepatic biliary ductal dilatation. Cholecystectomy clips. Pancreas: Unremarkable. No pancreatic ductal dilatation or surrounding inflammatory changes. Spleen: Normal in size without focal abnormality. Adrenals/Urinary Tract: Normal appearing adrenal glands. The kidneys remains small, containing multiple cysts and vascular calcifications. No significant urine in the urinary bladder. No visible ureteral abnormalities. Stomach/Bowel: Stomach is within normal limits. Appendix appears normal. No evidence of bowel wall thickening, distention, or inflammatory changes. Vascular/Lymphatic: Atheromatous arterial calcifications without aneurysm. No enlarged lymph nodes. Reproductive: Prostate is unremarkable. Other: No abdominal wall hernia or abnormality. No  abdominopelvic ascites. Musculoskeletal: Ray cages at the L3-4 level and solid interbody bone and pedicle screw and rod fusion at the L4-5 level. Lumbar and lower thoracic spine degenerative changes. IMPRESSION: No acute abnormality. Electronically Signed   By: Claudie Revering M.D.   On: 07/31/2019 15:35    Lab Data:  CBC: Recent Labs  Lab 07/31/19 1213 07/31/19 1251 08/01/19 0231  WBC 10.0  --  8.9  NEUTROABS 8.1*  --   --   HGB 13.5 10.2* 10.1*  HCT 44.2 30.0* 32.1*  MCV 98.9  --  97.0  PLT 209  --  947   Basic Metabolic Panel: Recent Labs  Lab 07/31/19 1722 08/01/19 0231 08/02/19  0720 08/03/19 0253 08/04/19 0228  NA 139 140 138 136 137  K 5.7* 5.4* 4.1 4.2 4.7  CL 98 101 99 100 98  CO2 25 25 24 22  20*  GLUCOSE 127* 130* 74 91 127*  BUN 46* 51* 24* 33* 48*  CREATININE 8.48* 9.37* 6.07* 8.21* 10.25*  CALCIUM 8.6* 8.7* 8.0* 8.4* 8.3*  PHOS  --   --   --   --  6.9*   GFR: Estimated Creatinine Clearance: 8.5 mL/min (A) (by C-G formula based on SCr of 10.25 mg/dL (H)). Liver Function Tests: Recent Labs  Lab 07/31/19 1213 08/01/19 0231 08/04/19 1508  AST 40 25 30  ALT 63* 48* 26  ALKPHOS 147* 132* 99  BILITOT 1.1 0.9 0.9  PROT 7.3 6.8 5.7*  ALBUMIN 3.2* 2.9* 2.3*   Recent Labs  Lab 07/31/19 1213  LIPASE 33   No results for input(s): AMMONIA in the last 168 hours. Coagulation Profile: Recent Labs  Lab 07/31/19 1213  INR 1.1   Cardiac Enzymes: No results for input(s): CKTOTAL, CKMB, CKMBINDEX, TROPONINI in the last 168 hours. BNP (last 3 results) No results for input(s): PROBNP in the last 8760 hours. HbA1C: No results for input(s): HGBA1C in the last 72 hours. CBG: Recent Labs  Lab 08/04/19 0020 08/04/19 0426 08/04/19 0803 08/04/19 1444 08/04/19 1616  GLUCAP 163* 87 92 89 108*   Lipid Profile: No results for input(s): CHOL, HDL, LDLCALC, TRIG, CHOLHDL, LDLDIRECT in the last 72 hours. Thyroid Function Tests: No results for input(s): TSH,  T4TOTAL, FREET4, T3FREE, THYROIDAB in the last 72 hours. Anemia Panel: No results for input(s): VITAMINB12, FOLATE, FERRITIN, TIBC, IRON, RETICCTPCT in the last 72 hours. Urine analysis:    Component Value Date/Time   COLORURINE LT. YELLOW 06/26/2013 1200   APPEARANCEUR CLEAR 06/26/2013 1200   LABSPEC 1.020 06/26/2013 1200   PHURINE 8.5 06/26/2013 1200   GLUCOSEU 100 06/26/2013 1200   HGBUR MODERATE 06/26/2013 1200   BILIRUBINUR NEGATIVE 06/26/2013 1200   KETONESUR NEGATIVE 06/26/2013 1200   PROTEINUR >300 (A) 01/04/2010 1032   UROBILINOGEN 0.2 06/26/2013 1200   NITRITE NEGATIVE 06/26/2013 1200   LEUKOCYTESUR TRACE 06/26/2013 1200     Kathie Dike M.D. [facility] 08/04/2019, 8:11 PM  After 7pm go to [email or web address]   Call night coverage person covering after 7pm

## 2019-08-04 NOTE — Procedures (Signed)
I was present at this dialysis session. I have reviewed the session itself and made appropriate changes.  S/p ERCP with extraction of stent as well as multiple stones then with replacement of stent.  Doing well.  Vital signs in last 24 hours:  Temp:  [97.3 F (36.3 C)-98.5 F (36.9 C)] 98.1 F (36.7 C) (11/03 0425) Pulse Rate:  [70-78] 73 (11/03 0425) Resp:  [11-19] 19 (11/03 0425) BP: (107-147)/(50-73) 122/67 (11/03 0425) SpO2:  [91 %-100 %] 100 % (11/03 0425) Weight change:  Filed Weights   07/31/19 1103 08/01/19 1238 08/01/19 1657  Weight: 113.4 kg 104.8 kg 103.3 kg    Recent Labs  Lab 08/04/19 0228  NA 137  K 4.7  CL 98  CO2 20*  GLUCOSE 127*  BUN 48*  CREATININE 10.25*  CALCIUM 8.3*  PHOS 6.9*    Recent Labs  Lab 07/31/19 1213 07/31/19 1251 08/01/19 0231  WBC 10.0  --  8.9  NEUTROABS 8.1*  --   --   HGB 13.5 10.2* 10.1*  HCT 44.2 30.0* 32.1*  MCV 98.9  --  97.0  PLT 209  --  216    Scheduled Meds: . allopurinol  100 mg Oral Daily  . aspirin EC  325 mg Oral Daily  . Chlorhexidine Gluconate Cloth  6 each Topical Q0600  . cinacalcet  90 mg Oral Q supper  . darbepoetin (ARANESP) injection - DIALYSIS  40 mcg Intravenous Q Sat-HD  . doxercalciferol  3 mcg Intravenous Q T,Th,Sa-HD  . ezetimibe  10 mg Oral QPM  . ferric citrate  630 mg Oral TID WC  . gabapentin  300 mg Oral BID  . heparin  5,000 Units Subcutaneous Q8H  . insulin aspart  0-9 Units Subcutaneous Q4H  . methimazole  2.5 mg Oral Daily  . midodrine  10 mg Oral Q T,Th,Sa-HD  . multivitamin  1 tablet Oral Daily  . pantoprazole  40 mg Oral QHS   Continuous Infusions: . ceFEPime (MAXIPIME) IV 1 g (08/02/19 1112)  . metronidazole 500 mg (08/04/19 0601)   PRN Meds:.acetaminophen **OR** acetaminophen, albuterol, diphenhydrAMINE, HYDROcodone-acetaminophen, hydroxypropyl methylcellulose / hypromellose, loperamide, ondansetron **OR** ondansetron (ZOFRAN) IV, polyethylene glycol   Donetta Potts,   MD 08/04/2019, 9:38 AM

## 2019-08-04 NOTE — Plan of Care (Signed)
  Problem: Education: Goal: Knowledge of General Education information will improve Description: Including pain rating scale, medication(s)/side effects and non-pharmacologic comfort measures 08/04/2019 1202 by Sherre Poot, RN Outcome: Adequate for Discharge 08/04/2019 1015 by Sherre Poot, RN Outcome: Progressing   Problem: Health Behavior/Discharge Planning: Goal: Ability to manage health-related needs will improve 08/04/2019 1202 by Sherre Poot, RN Outcome: Adequate for Discharge 08/04/2019 1015 by Sherre Poot, RN Outcome: Progressing   Problem: Clinical Measurements: Goal: Ability to maintain clinical measurements within normal limits will improve 08/04/2019 1202 by Sherre Poot, RN Outcome: Adequate for Discharge 08/04/2019 1015 by Sherre Poot, RN Outcome: Progressing Goal: Will remain free from infection 08/04/2019 1202 by Sherre Poot, RN Outcome: Adequate for Discharge 08/04/2019 1015 by Sherre Poot, RN Outcome: Progressing Goal: Diagnostic test results will improve 08/04/2019 1202 by Sherre Poot, RN Outcome: Adequate for Discharge 08/04/2019 1015 by Sherre Poot, RN Outcome: Progressing Goal: Respiratory complications will improve 08/04/2019 1202 by Sherre Poot, RN Outcome: Adequate for Discharge 08/04/2019 1015 by Sherre Poot, RN Outcome: Progressing Goal: Cardiovascular complication will be avoided 08/04/2019 1202 by Sherre Poot, RN Outcome: Adequate for Discharge 08/04/2019 1015 by Sherre Poot, RN Outcome: Progressing   Problem: Activity: Goal: Risk for activity intolerance will decrease 08/04/2019 1202 by Sherre Poot, RN Outcome: Adequate for Discharge 08/04/2019 1015 by Sherre Poot, RN Outcome: Progressing   Problem: Elimination: Goal: Will not experience complications related to bowel motility 08/04/2019 1202 by Sherre Poot, RN Outcome: Adequate for Discharge 08/04/2019  1015 by Sherre Poot, RN Outcome: Progressing Goal: Will not experience complications related to urinary retention 08/04/2019 1202 by Sherre Poot, RN Outcome: Adequate for Discharge 08/04/2019 1015 by Sherre Poot, RN Outcome: Progressing   Problem: Pain Managment: Goal: General experience of comfort will improve 08/04/2019 1202 by Sherre Poot, RN Outcome: Adequate for Discharge 08/04/2019 1015 by Sherre Poot, RN Outcome: Progressing   Problem: Safety: Goal: Ability to remain free from injury will improve 08/04/2019 1202 by Sherre Poot, RN Outcome: Adequate for Discharge 08/04/2019 1015 by Sherre Poot, RN Outcome: Progressing

## 2019-08-05 ENCOUNTER — Encounter (HOSPITAL_COMMUNITY): Payer: Self-pay | Admitting: Gastroenterology

## 2019-08-05 DIAGNOSIS — K8050 Calculus of bile duct without cholangitis or cholecystitis without obstruction: Secondary | ICD-10-CM

## 2019-08-05 LAB — CULTURE, BLOOD (ROUTINE X 2)
Culture: NO GROWTH
Culture: NO GROWTH
Special Requests: ADEQUATE
Special Requests: ADEQUATE

## 2019-08-05 LAB — BASIC METABOLIC PANEL
Anion gap: 15 (ref 5–15)
BUN: 17 mg/dL (ref 8–23)
CO2: 25 mmol/L (ref 22–32)
Calcium: 8.4 mg/dL — ABNORMAL LOW (ref 8.9–10.3)
Chloride: 100 mmol/L (ref 98–111)
Creatinine, Ser: 6.43 mg/dL — ABNORMAL HIGH (ref 0.61–1.24)
GFR calc Af Amer: 9 mL/min — ABNORMAL LOW (ref >60)
GFR calc non Af Amer: 8 mL/min — ABNORMAL LOW (ref >60)
Glucose, Bld: 82 mg/dL (ref 70–99)
Potassium: 3.9 mmol/L (ref 3.5–5.1)
Sodium: 140 mmol/L (ref 135–145)

## 2019-08-05 LAB — GLUCOSE, CAPILLARY
Glucose-Capillary: 79 mg/dL (ref 70–99)
Glucose-Capillary: 79 mg/dL (ref 70–99)
Glucose-Capillary: 83 mg/dL (ref 70–99)

## 2019-08-05 MED ORDER — METRONIDAZOLE 500 MG PO TABS: 500.0000 mg | ORAL_TABLET | Freq: Three times a day (TID) | ORAL | 0 refills | Status: AC

## 2019-08-05 MED ORDER — CEFDINIR 300 MG PO CAPS: 300.0000 mg | ORAL_CAPSULE | Freq: Two times a day (BID) | ORAL | 0 refills | Status: AC

## 2019-08-05 MED FILL — metroNIDAZOLE 500 MG TABS: 500 | 1 days supply | Qty: 3 | Fill #0

## 2019-08-05 MED FILL — CEFDINIR 300 MG CAPSULE: 300 | 1 days supply | Qty: 2 | Fill #0

## 2019-08-05 NOTE — TOC Initial Note (Signed)
Transition of Care [facility]) - Initial/Assessment Note    Patient Details  Name: [name] MRN: [id number] Date of Birth: 12-Jul-1951  Transition of [facility]) CM/SW Contact:    Benard Halsted, LCSW Phone Number: 08/05/2019, 10:46 AM  Clinical Narrative:                 CSW spoke with patient's spouse. She confirmed that patient is currently active with [facility]. MD placing resumption orders for PT/OT/Aide. [facility] aware. No other needs present at this time. Patient's spouse aware of discharge today.   Expected Discharge Plan: [place] Barriers to Discharge: No Barriers Identified   Patient Goals and CMS Choice Patient states their goals for this hospitalization and ongoing recovery are:: Go home CMS Medicare.gov Compare Post Acute Care list provided to:: Patient(Active with [facility]) Choice offered to / list presented to : Patient  Expected Discharge Plan and Services Expected Discharge Plan: [place] In-house Referral: NA Discharge Planning Services: CM Consult Post Acute Care Choice: [place] arrangements for the past 2 months: Single Family Home Expected Discharge Date: 08/05/19               DME Arranged: N/A         HH Arranged: PT, OT, Nurse's Aide HH Agency: [facility] [facility] [employer] Date [place]: 08/05/19 Time [place]: 40 Representative spoke with at [place]: B[place]  Prior Living Arrangements/Services Living arrangements for the past 2 months: [place] Lives with:: Spouse Patient language and need for interpreter reviewed:: Yes Do you feel safe going back to the place where you live?: Yes      Need for Family Participation in Patient Care: Yes (Comment) Care giver support system in place?: Yes (comment) Current home services: DME Criminal Activity/Legal Involvement Pertinent to Current Situation/Hospitalization: No - Comment as  needed  Activities of Daily Living Home Assistive Devices/Equipment: None ADL Screening (condition at time of admission) Patient's cognitive ability adequate to safely complete daily activities?: Yes Is the patient deaf or have difficulty hearing?: No Does the patient have difficulty seeing, even when wearing glasses/contacts?: No Does the patient have difficulty concentrating, remembering, or making decisions?: No Patient able to express need for assistance with ADLs?: Yes Does the patient have difficulty dressing or bathing?: No Independently performs ADLs?: Yes (appropriate for developmental age) Does the patient have difficulty walking or climbing stairs?: No Weakness of Legs: Both Weakness of Arms/Hands: None  Permission Sought/Granted Permission sought to share information with : Facility Sport and exercise psychologist, Family Supports Permission granted to share information with : Yes, Verbal Permission Granted  Share Information with NAME: [name]  Permission granted to share info w AGENCY: [facility]  Permission granted to share info w Relationship: Spouse  Permission granted to share info w Contact Information: [phone]  Emotional Assessment Appearance:: Appears stated age Attitude/Demeanor/Rapport: Engaged Affect (typically observed): Accepting Orientation: : Oriented to Self, Oriented to Place, Oriented to  Time, Oriented to Situation Alcohol / Substance Use: Not Applicable Psych Involvement: No (comment)  Admission diagnosis:  Hyperkalemia [E87.5] Epigastric pain [R10.13] ESRD (end stage renal disease) ([place]) [N18.6] Sepsis, due to unspecified organism, unspecified whether acute organ dysfunction present ([place]) [A41.9] Sepsis ([place]) [A41.9] Patient Active Problem List   Diagnosis Date Noted  . Atrial flutter ([place]) 07/04/2019  . Bilateral leg pain 07/04/2019  . RUQ pain 03/07/2019  . Common bile duct stone   .  Dialysis AV fistula malfunction ([place])   . Recurrent  Clostridioides difficile diarrhea   . Septic shock ([place]) 11/11/2018  . Cerebral embolism with cerebral infarction 10/05/2018  . Acute hepatitis   . Left wrist pain 09/03/2018  . C. difficile colitis 09/03/2018  . Altered mental status   . Bacteremia   . Acute gouty arthritis 08/08/2018  . Sepsis ([place]) 07/16/2018  . Thrush 06/21/2018  . GI bleed 06/11/2018  . Recurrent falls 05/14/2018  . Chronic combined systolic (congestive) and diastolic (congestive) heart failure ([place]) 01/15/2018  . Abscess of left leg 10/14/2017  . Insomnia 10/14/2017  . Lumbar stenosis with neurogenic claudication 07/31/2017  . Lightheadedness   . Rectal bleeding   . Gait disorder 05/05/2017  . Chronic GI bleeding 04/22/2017  . Malaise and fatigue 12/27/2016  . Generalized weakness 12/27/2016  . History of PSVT (paroxysmal supraventricular tachycardia) 10/16/2016  . Abdominal pain 08/27/2016  . Abnormal findings on diagnostic imaging of liver and biliary tract 08/27/2016  . Bloating symptom 08/27/2016  . Hematochezia 08/27/2016  . Esophageal ulcer 08/27/2016  . Flatulence, eructation and gas pain 08/27/2016  . General weakness 08/27/2016  . Skin sensation disturbance 08/27/2016  . Stricture of esophagus 08/27/2016  . Status post coronary artery stent placement   . ST segment depression 08/05/2016  . PAD (peripheral artery disease) ([place]) 06/18/2016  . Cervical radiculopathy, chronic 02/23/2016  . Memory loss 12/22/2015  . Left-sided weakness 12/22/2015  . Peripheral polyneuropathy 12/22/2015  . Nausea and vomiting 10/31/2015  . Malignant neoplasm of prostate ([place]) 10/24/2015  . S/P right total knee replacement 08/09/2015  . Primary osteoarthritis of right knee 08/05/2015  . Diabetes ([place]) 08/05/2015  . Hyperthyroidism 03/11/2015  . Nodule of right lung 09/14/2014  . PSA elevation 08/30/2014  . ESRD on hemodialysis ([place]) 08/16/2014  . Essential hypertension 08/16/2014  . Encounter for hemodialysis  ([place]) 08/16/2014  . Allergic rhinitis 02/24/2014  . Vertigo 10/14/2013  . Cough 06/26/2013  . Numbness 05/15/2013  . Other complications due to renal dialysis device, implant, and graft 01/14/2013  . Right hand pain 03/28/2012  . Dysphagia 03/28/2012  . Hypogonadism, male 03/28/2012  . OSA (obstructive sleep apnea) 10/16/2011  . HLD (hyperlipidemia) 10/16/2011  . CAD S/P percutaneous coronary angioplasty 09/28/2011  . NSTEMI (non-ST elevated myocardial infarction) ([place]) 09/28/2011  . AVM (arteriovenous malformation) 09/27/2011  . Anemia associated with acute blood loss 09/26/2011  . Ischemic cardiomyopathy 06/16/2011  . Hyperparathyroidism, secondary ([place]) 06/16/2011  . Preventative health care 03/11/2011  . NECK PAIN 07/31/2010  . GYNECOMASTIA 07/17/2010  . DIZZINESS 07/17/2010  . GAIT DISTURBANCE 03/03/2010  . Thyrotoxicosis 02/02/2010  . Elevated levels of transaminase & lactic acid dehydrogenase 02/01/2010  . Other malaise and fatigue 11/24/2009  . Depression 10/14/2009  . BENIGN PROSTATIC HYPERTROPHY 10/14/2009  . COLONIC POLYPS, HX OF 10/14/2009  . Dementia ([place]) 09/02/2009  . PULMONARY NODULE, RIGHT LOWER LOBE 06/08/2009  . DYSPNEA 10/29/2008  . ONYCHOMYCOSIS, TOENAILS 12/26/2007  . GOITER, MULTINODULAR 12/26/2007  . Gout 06/18/2007  . Neuropathy ([place]) 06/18/2007  . Hypertensive heart disease with CHF (congestive heart failure) ([place]) 06/18/2007  . Gastro-esophageal reflux disease without esophagitis 06/18/2007   PCP:  Biagio Borg, MD Pharmacy:   [facility] [address] [place]), [place] - [place] [address] [place]) [place] [address] Phone: [phone] Fax: [phone]  [id number] - [place], [place]. [place] [address] Phone: [phone] Fax: [phone]  [facility]  Patient [place], [place] [address] Phone: [phone]  Fax: [phone]  [facility] [place], [place] - [address] [place] [address] Phone: [phone] Fax: [phone]     Social Determinants of Health (SDOH) Interventions    Readmission Risk Interventions Readmission Risk Prevention Plan 08/05/2019  Transportation Screening Complete  Medication Review ([place]) Complete  PCP or Specialist appointment within 3-5 days of discharge Complete  HRI or [place] Complete  SW Recovery Care/Counseling Consult Complete  [place] Not Applicable  Some recent data might be hidden

## 2019-08-05 NOTE — Progress Notes (Signed)
Patient discharged home with wife and all belongings. Wife called and updated, all questions answered. No other concerns at this time, IV removed, tele removed.

## 2019-08-05 NOTE — Plan of Care (Signed)
  Problem: Education: Goal: Knowledge of General Education information will improve Description: Including pain rating scale, medication(s)/side effects and non-pharmacologic comfort measures Outcome: Progressing   Problem: Clinical Measurements: Goal: Will remain free from infection Outcome: Progressing Goal: Diagnostic test results will improve Outcome: Progressing Goal: Respiratory complications will improve Outcome: Progressing   

## 2019-08-05 NOTE — Progress Notes (Addendum)
[facility] Progress Note   Subjective:   Seen and examined at bedside. Feeling well today but reports he felt bad after dialysis yesterday. BP soft overnight. Pt feels too much fluid was pulled. Denies SOB, orthopnea, CP, palpitations, abdominal pain, N/V/D. Reports he is going home today.   Objective Vitals:   08/04/19 2030 08/04/19 2312 08/05/19 0438 08/05/19 0931  BP: (!) 99/43  (!) 99/44 (!) 121/57  Pulse: 76  74 73  Resp: 18 15 18 20   Temp: 98.2 F (36.8 C)  98.7 F (37.1 C) 98.7 F (37.1 C)  TempSrc: Oral  Oral Oral  SpO2: 94%  93% 95%  Weight:      Height:       Physical Exam General: Well developed, alert male in NAD Heart: RRR, no murmurs, rubs or gallops Lungs: CTA bilaterally without wheezing, rhonchi or rales Abdomen: Soft, non-tender, non-distended. +BS Extremities: No peripheral edema b/l lower extremities Dialysis Access: RUE AVF + thrill/bruit   Additional Objective Labs: Basic Metabolic Panel: Recent Labs  Lab 08/03/19 0253 08/04/19 0228 08/05/19 0203  NA 136 137 140  K 4.2 4.7 3.9  CL 100 98 100  CO2 22 20* 25  GLUCOSE 91 127* 82  BUN 33* 48* 17  CREATININE 8.21* 10.25* 6.43*  CALCIUM 8.4* 8.3* 8.4*  PHOS  --  6.9*  --    Liver Function Tests: Recent Labs  Lab 07/31/19 1213 08/01/19 0231 08/04/19 1508  AST 40 25 30  ALT 63* 48* 26  ALKPHOS 147* 132* 99  BILITOT 1.1 0.9 0.9  PROT 7.3 6.8 5.7*  ALBUMIN 3.2* 2.9* 2.3*   Recent Labs  Lab 07/31/19 1213  LIPASE 33   CBC: Recent Labs  Lab 07/31/19 1213 07/31/19 1251 08/01/19 0231  WBC 10.0  --  8.9  NEUTROABS 8.1*  --   --   HGB 13.5 10.2* 10.1*  HCT 44.2 30.0* 32.1*  MCV 98.9  --  97.0  PLT 209  --  216   Blood Culture    Component Value Date/Time   SDES BLOOD LEFT ANTECUBITAL 07/31/2019 1213   SDES BLOOD LEFT ANTECUBITAL 07/31/2019 1213   SPECREQUEST AEROBIC BOTTLE ONLY Blood Culture adequate volume 07/31/2019 1213   SPECREQUEST  07/31/2019 1213     BOTTLES DRAWN AEROBIC AND ANAEROBIC Blood Culture adequate volume   CULT  07/31/2019 1213    NO GROWTH 4 DAYS Performed at [facility], [place] [address] [place], [place] [address]    CULT  07/31/2019 1213    NO GROWTH 4 DAYS Performed at [facility], [place] [address] [place], [place] [address]    REPTSTATUS PENDING 07/31/2019 1213   REPTSTATUS PENDING 07/31/2019 1213    CBG: Recent Labs  Lab 08/04/19 1616 08/04/19 2027 08/05/19 0053 08/05/19 0435 08/05/19 0753  GLUCAP 108* 192* 79 83 79    Studies/Results: Dg Ercp  Result Date: 08/03/2019 CLINICAL DATA:  68 year old male with a history of choledocholithiasis EXAM: ERCP TECHNIQUE: Multiple spot images obtained with the fluoroscopic device and submitted for interpretation post-procedure. FLUOROSCOPY TIME:  Fluoroscopy Time:  6 minutes 6 seconds COMPARISON:  None FINDINGS: Limited intraoperative fluoroscopic spot images during ERCP. Initial image demonstrates endoscope projecting over the upper abdomen. There is then cannulation of the ampulla and retrograde infusion of contrast, with incomplete opacification of the extrahepatic biliary system. Final image demonstrates placement of a plastic biliary stent. Changes of cholecystectomy IMPRESSION: Limited images during ERCP demonstrates incomplete opacification of the  extrahepatic biliary system with placement of a plastic biliary stent. Changes of prior cholecystectomy. Please refer to the dictated operative report for full details of intraoperative findings and procedure. Electronically Signed   By: Corrie Mckusick D.O.   On: 08/03/2019 13:15   Medications: . ceFEPime (MAXIPIME) IV 1 g (08/05/19 1043)  . metronidazole 500 mg (08/05/19 0524)   . allopurinol  100 mg Oral Daily  . aspirin EC  325 mg Oral Daily  . Chlorhexidine Gluconate Cloth  6 each Topical Q0600  . cinacalcet  90 mg Oral Q supper  . darbepoetin (ARANESP) injection - DIALYSIS  40 mcg Intravenous Q Sat-HD  .  doxercalciferol  3 mcg Intravenous Q T,Th,Sa-HD  . ezetimibe  10 mg Oral QPM  . ferric citrate  630 mg Oral TID WC  . gabapentin  300 mg Oral BID  . heparin  5,000 Units Subcutaneous Q8H  . insulin aspart  0-9 Units Subcutaneous Q4H  . methimazole  2.5 mg Oral Daily  . midodrine  10 mg Oral Q T,Th,Sa-HD  . multivitamin  1 tablet Oral Daily  . pantoprazole  40 mg Oral QHS    Dialysis Orders: Center:[facility]. RUE AVF; T 4hr 35min; BFR 450, DFR 800; EDW 102kg; 3K/ 2.25 Ca  No heparin Mircera 60 mcg IV q 2 weeks (last dose 75 mcg on 06/09/2019) Venofer 100mg  q HD (stop date 08/06/2019) [name] 3 mcg q HD Midodrine 10mg  PO before HD Auryxia 210mg  2 tabs TID AC Sensipar 90 mg PO BID  Assessment/Plan: 1. Sepsis: CXR unremarkable, CT abdomen shows no clear etiology. Ongoing abdominal tenderness and hx of gallstones. On flagyl and cefepime. Cultures pending. S/p ERCP on 08/03/2019 and GI recommending follow up in 4-6 weeks for residual stones.  2. Neuropathy: Well controlled, continue gabapentin.  3. Encephalopathy/weakness:Resolved. Ct head unremarkable. Hyperkalemia likely contributed to weakness.  4. Hyperkalemia: K+ 6.8 on admission, now 3.9. Had recently been changed to [place] bath with HD, change back to 2K. If K+ remains elevated, consider evaluating fistula to rule out recirculation. Low K+ diet. 5. ESRD:Continue TTS schedule, will plan for next HD on 08/04/2019.  6. Hypertension/volume:BP soft overnight.  on midodrine pre-HD. No evidence of volume overload and felt poorly after dialysis yesterday. Still over EDW by weights here, though weights have been variable. EDW may need to be raised slightly at discharge. 7. Anemia:Hgb 10.1. Receiving course of venofer as an outpatient, hold for now due to sepsis. Given aranep here, will start mircera at discharge.   8. Metabolic bone disease:Calcium 8.9 corrected. Phos 6.9.  Continue hectorol, sensipar and  [place]. 9. Nutrition:Renal diet with fluid restrictions.  Anice Paganini, PA-C 08/05/2019, 11:36 AM  [facility] Pager: [phone]  I have seen and examined this patient and agree with plan and assessment in the above note with renal recommendations/intervention highlighted.  PT stable for discharge to SNF today and f/u with outpatient HD unit.  Broadus John A Lauramae Kneisley,MD 08/05/2019 12:13 PM

## 2019-08-05 NOTE — Discharge Summary (Signed)
Physician Discharge Summary  DAREK EIFLER TOI:[id number] DOB: May 14, 1951 DOA: 07/31/2019  PCP: Biagio Borg, MD  Admit date: 07/31/2019 Discharge date: 08/05/2019  Admitted From: Home Disposition:  Home   Recommendations for Outpatient Follow-up:  1. Follow up with PCP in 1 week 2. Follow up with repeat ERCP with Dr. Watt Climes in 4 to 6 weeks for removal of residual CBD stones  Discharge Condition: Stable CODE STATUS: Full  Diet recommendation: Renal diet   Brief/Interim Summary: [name] a 68 y.o.malewith medical history significant for but not limited to dementia, diabetes mellitus type 2, ESRD on HD T/TH/S, CAD, CHF, presenting with few days history of generalized weakness and mental status change associated with fever of 100.4, mild tachycardia, hypotensive. COVID-19 was negative, no acute infiltrate on chest x-ray.  Patient started on IV antibiotics admitted for early sepsis.  He was found to have cholangitis, choledocholithiasis.  GI was consulted and patient underwent ERCP with multiple stones removed, old stent removed.  New biliary stent was placed.  Patient had improvement in his LFT and diet advanced.  Due to hypotension after dialysis on 11/3, discharge was held.  On day of discharge, patient reported feeling well and ready to go home.  Blood pressure improved.  Discharge Diagnoses:  Principal Problem:   Sepsis ([place]) Active Problems:   Gout   Dementia ([place])   ESRD on hemodialysis ([place])   Essential hypertension   Sepsis secondary to cholangitis Preseneds with tachycardia, hypotension, elevated lactic acid level Seen by GI, and patient underwent ERCP with multiple stones removed and old stent removed. New biliary stent was placed Blood culture negative LFTs improved Follow-up with repeat ERCP with Dr. Watt Climes in 4 to 6 weeks for removal of residual CBD stones Complete antibiotic treatment  Metabolic encephalopathy, acute CT head negative Seems to have  resolved  End-stage renal disease on hemodialysis  Per nephrology  Chronic systolic/diastolic HF Appears euvolemic TEE done on 7/20 showed EF of 60 to 65% Volume status managed with HD No longer on ACE/ARB's, BB due to low BP with ESRD  CAD status post stent toRCAin 2008,recurrent NSTEMI Continue aspirin No longer on Plavix due to rectal bleeding from possible radiation proctitis Continue Zetia,PCSK-9  Diabetes mellitus type 2 Last A1c on 05/2019 was 6 Diet-controlled  Hyperthyroidism TSH, free T4 normal range Continue methimazole  COPD On O2 at night  OSA on CPAP CPAP ordered QHS    Discharge Instructions  Discharge Instructions    Call MD for:  difficulty breathing, headache or visual disturbances   Complete by: As directed    Call MD for:  extreme fatigue   Complete by: As directed    Call MD for:  persistant dizziness or light-headedness   Complete by: As directed    Call MD for:  persistant nausea and vomiting   Complete by: As directed    Call MD for:  severe uncontrolled pain   Complete by: As directed    Call MD for:  temperature >100.4   Complete by: As directed    Discharge instructions   Complete by: As directed    You were cared for by a hospitalist during your hospital stay. If you have any questions about your discharge medications or the care you received while you were in the hospital after you are discharged, you can call the unit and ask to speak with the hospitalist on call if the hospitalist that took care of you is not available. Once you are discharged,  your primary care physician will handle any further medical issues. Please note that NO REFILLS for any discharge medications will be authorized once you are discharged, as it is imperative that you return to your primary care physician (or establish a relationship with a primary care physician if you do not have one) for your aftercare needs so that they can reassess your need for  medications and monitor your lab values.   Increase activity slowly   Complete by: As directed      Allergies as of 08/05/2019      Reactions   Statins Other (See Comments)   Weak muscles   Ciprofloxacin Rash      Medication List    TAKE these medications   acetaminophen 325 MG tablet Commonly known as: TYLENOL Take 325 mg by mouth every 6 (six) hours as needed for mild pain or headache.   allopurinol 100 MG tablet Commonly known as: ZYLOPRIM Take 1 tablet by mouth once daily What changed: when to take this   aspirin EC 325 MG tablet Take 1 tablet (325 mg total) by mouth daily.   betamethasone dipropionate 0.05 % cream Apply 1 application topically daily as needed (leg sores).   cefdinir 300 MG capsule Commonly known as: OMNICEF Take 1 capsule (300 mg total) by mouth 2 (two) times daily for 1 day.   cinacalcet 90 MG tablet Commonly known as: SENSIPAR Take 90 mg by mouth 2 (two) times daily.   colestipol 1 g tablet Commonly known as: COLESTID Take 3 tablets (3 g total) by mouth daily.   doxercalciferol 4 MCG/2ML injection Commonly known as: [name] Inject 1 mL (2 mcg total) into the vein Every Tuesday,Thursday,and Saturday with dialysis.   ezetimibe 10 MG tablet Commonly known as: ZETIA Take 10 mg by mouth at bedtime.   ferric citrate 1 GM 210 MG(Fe) tablet Commonly known as: AURYXIA Take 420 mg by mouth 3 (three) times daily with meals.   gabapentin 300 MG capsule Commonly known as: NEURONTIN TAKE 1 TO 2 CAPSULES BY MOUTH AT BEDTIME AS NEEDED FOR PAIN AND FOR SLEEP What changed: See the new instructions.   hydroxypropyl methylcellulose / hypromellose 2.5 % ophthalmic solution Commonly known as: ISOPTO TEARS / GONIOVISC Place 1 drop into both eyes 3 (three) times daily as needed for dry eyes.   methimazole 5 MG tablet Commonly known as: TAPAZOLE Take 1/2 (one-half) tablet by mouth once daily What changed: See the new instructions.   metroNIDAZOLE 500  MG tablet Commonly known as: Flagyl Take 1 tablet (500 mg total) by mouth 3 (three) times daily for 1 day.   midodrine 10 MG tablet Commonly known as: PROAMATINE Take 10 mg by mouth See admin instructions. Take 10mg  every Tuesday, Thursday and Saturday   multivitamin Tabs tablet Take 1 tablet by mouth daily.   pantoprazole 40 MG tablet Commonly known as: PROTONIX Take 40 mg by mouth at bedtime.   polyethylene glycol 17 g packet Commonly known as: MIRALAX / GLYCOLAX Take 17 g by mouth daily as needed for mild constipation.   Praluent 150 MG/ML [place] Generic drug: Alirocumab Inject 1 pen into the skin every 14 (fourteen) days.   zolpidem 5 MG tablet Commonly known as: AMBIEN Take 1 tablet (5 mg total) by mouth at bedtime as needed. for sleep What changed:   reasons to take this  additional instructions      Follow-up Information    Clarene Essex, MD. Schedule an appointment as soon as possible for  a visit in 3 week(s).   Specialty: Gastroenterology Why: Needs repeat ERCP for residual CBD stones. Contact information: [address] [place] [address] [phone]          Allergies  Allergen Reactions  . Statins Other (See Comments)    Weak muscles  . Ciprofloxacin Rash    Consultations:  GI  Nephrology    Procedures/Studies: Ct Head Wo Contrast  Result Date: 07/31/2019 CLINICAL DATA:  Encephalopathy EXAM: CT HEAD WITHOUT CONTRAST TECHNIQUE: Contiguous axial images were obtained from the base of the skull through the vertex without intravenous contrast. COMPARISON:  10/04/2018 FINDINGS: Brain: Stable atrophy and chronic white matter microvascular ischemic changes throughout both cerebral hemispheres. No acute intracranial hemorrhage, new mass lesion, definite infarction, midline shift, herniation, hydrocephalus, or extra-axial fluid collection. No focal mass effect or edema. Cisterns are patent. Minor cerebellar atrophy. Vascular: No  hyperdense vessel or unexpected calcification. Skull: Normal. Negative for fracture or focal lesion. Sinuses/Orbits: No acute finding. Other: None. IMPRESSION: Stable atrophy and chronic white matter microvascular ischemic changes. No interval change or acute process by noncontrast CT. Electronically Signed   By: Jerilynn Mages.  Shick M.D.   On: 07/31/2019 15:27   Dg Chest Portable 1 View  Result Date: 07/31/2019 CLINICAL DATA:  Chest pain, fever, dialysis patient. EXAM: PORTABLE CHEST 1 VIEW COMPARISON:  07/03/2019 FINDINGS: Borderline enlarged cardiac silhouette. The aorta remains tortuous and calcified. Stable linear scarring in the left mid lung zone. Mildly prominent interstitial markings with improvement. Cervical spine fixation hardware. Right shoulder degenerative changes. IMPRESSION: 1. No acute abnormality. 2. Stable borderline cardiomegaly. 3. Mild chronic interstitial lung disease. Electronically Signed   By: Claudie Revering M.D.   On: 07/31/2019 11:49   Dg Ercp  Result Date: 08/03/2019 CLINICAL DATA:  68 year old male with a history of choledocholithiasis EXAM: ERCP TECHNIQUE: Multiple spot images obtained with the fluoroscopic device and submitted for interpretation post-procedure. FLUOROSCOPY TIME:  Fluoroscopy Time:  6 minutes 6 seconds COMPARISON:  None FINDINGS: Limited intraoperative fluoroscopic spot images during ERCP. Initial image demonstrates endoscope projecting over the upper abdomen. There is then cannulation of the ampulla and retrograde infusion of contrast, with incomplete opacification of the extrahepatic biliary system. Final image demonstrates placement of a plastic biliary stent. Changes of cholecystectomy IMPRESSION: Limited images during ERCP demonstrates incomplete opacification of the extrahepatic biliary system with placement of a plastic biliary stent. Changes of prior cholecystectomy. Please refer to the dictated operative report for full details of intraoperative findings and  procedure. Electronically Signed   By: Corrie Mckusick D.O.   On: 08/03/2019 13:15   Vas [place] Burnard Bunting With/wo Tbi  Result Date: 07/08/2019 LOWER EXTREMITY DOPPLER STUDY Indications: Claudication, ulceration, and peripheral artery disease. High Risk Factors: Hypertension, hyperlipidemia, Diabetes, past history of                    smoking, prior MI, coronary artery disease.  Performing Technologist: Burley Saver RVT  Examination Guidelines: A complete evaluation includes at minimum, Doppler waveform signals and systolic blood pressure reading at the level of bilateral brachial, anterior tibial, and posterior tibial arteries, when vessel segments are accessible. Bilateral testing is considered an integral part of a complete examination. Photoelectric Plethysmograph (PPG) waveforms and toe systolic pressure readings are included as required and additional duplex testing as needed. Limited examinations for reoccurring indications may be performed as noted.  ABI Findings: +---------+------------------+-----+-------------------+--------+ Right    Rt Pressure (mmHg)IndexWaveform  Comment  +---------+------------------+-----+-------------------+--------+ Brachial                                           AVF      +---------+------------------+-----+-------------------+--------+ PTA      69                0.51 dampened monophasic         +---------+------------------+-----+-------------------+--------+ DP       66                0.49 dampened monophasic         +---------+------------------+-----+-------------------+--------+ Great Toe44                0.32 Abnormal                    +---------+------------------+-----+-------------------+--------+ +---------+------------------+-----+-------------------+-------+ Left     Lt Pressure (mmHg)IndexWaveform           Comment +---------+------------------+-----+-------------------+-------+ Brachial 136                                                +---------+------------------+-----+-------------------+-------+ PTA      51                0.38 dampened monophasic        +---------+------------------+-----+-------------------+-------+ DP       61                0.45 dampened monophasic        +---------+------------------+-----+-------------------+-------+ Great Toe26                0.19 Abnormal                   +---------+------------------+-----+-------------------+-------+ +-------+-----------+-----------+------------+------------+ ABI/TBIToday's ABIToday's TBIPrevious ABIPrevious TBI +-------+-----------+-----------+------------+------------+ Right  0.51       0.32       0.78        0.61         +-------+-----------+-----------+------------+------------+ Left   0.45       0.19       0.50        0.40         +-------+-----------+-----------+------------+------------+ Right ABIs and TBIs appear decreased compared to prior study on 05/07/2018. Left TBIs appear decreased compared to prior study on 05/07/2018. Left ABIs appear slightly decreased when compared to previous study.  Summary: Right: Resting right ankle-brachial index indicates moderate right lower extremity arterial disease. The right toe-brachial index is abnormal. RT great toe pressure = 44 mmHg. PPG tracings appear dampened. Left: Resting left ankle-brachial index indicates severe left lower extremity arterial disease. The left toe-brachial index is abnormal. LT Great toe pressure = 26 mmHg. PPG tracings appear dampened.  *See table(s) above for measurements and observations.  Electronically signed by Ruta Hinds MD on 07/08/2019 at 1:51:45 PM.    Final    Ct Renal Stone Study  Result Date: 07/31/2019 CLINICAL DATA:  Abdominal pain. EXAM: CT ABDOMEN AND PELVIS WITHOUT CONTRAST TECHNIQUE: Multidetector CT imaging of the abdomen and pelvis was performed following the standard protocol without IV contrast. COMPARISON:  11/11/2018 FINDINGS: Lower  chest: Minimal bilateral dependent atelectasis or scarring, without significant change. Stable 6 mm partially calcified subpleural nodule in the lateral aspect of the right  upper lobe, anteriorly, on image number 7 series 5. Borderline enlarged heart. Atheromatous coronary artery calcifications. Hepatobiliary: Intrahepatic biliary air is again demonstrated with stable mild dilatation of the intrahepatic ducts. A biliary stent extending from the proximal common duct into the duodenum is currently demonstrated without the previously demonstrated extrahepatic biliary ductal dilatation. Cholecystectomy clips. Pancreas: Unremarkable. No pancreatic ductal dilatation or surrounding inflammatory changes. Spleen: Normal in size without focal abnormality. Adrenals/Urinary Tract: Normal appearing adrenal glands. The kidneys remains small, containing multiple cysts and vascular calcifications. No significant urine in the urinary bladder. No visible ureteral abnormalities. Stomach/Bowel: Stomach is within normal limits. Appendix appears normal. No evidence of bowel wall thickening, distention, or inflammatory changes. Vascular/Lymphatic: Atheromatous arterial calcifications without aneurysm. No enlarged lymph nodes. Reproductive: Prostate is unremarkable. Other: No abdominal wall hernia or abnormality. No abdominopelvic ascites. Musculoskeletal: Ray cages at the L3-4 level and solid interbody bone and pedicle screw and rod fusion at the L4-5 level. Lumbar and lower thoracic spine degenerative changes. IMPRESSION: No acute abnormality. Electronically Signed   By: Claudie Revering M.D.   On: 07/31/2019 15:35    ERCP 11/2 Impression:       - Multiple residual bile duct stones.                           - Choledocholithiasis was found. Many stones, over                            10, removed,but was some stones remain; a plastic                            bile duct stent was inserted.                           - One stent was  removed from the common bile duct.                           - The biliary tree was swept.                           - The biliary tree was swept.                           - One temporary stent was placed into the common bile duct.    Discharge Exam: Vitals:   08/05/19 0438 08/05/19 0931  BP: (!) 99/44 (!) 121/57  Pulse: 74 73  Resp: 18 20  Temp: 98.7 F (37.1 C) 98.7 F (37.1 C)  SpO2: 93% 95%     General: Pt is alert, awake, not in acute distress Cardiovascular: RRR, S1/S2 +, no edema Respiratory: CTA bilaterally, no wheezing, no rhonchi, no respiratory distress, no conversational dyspnea  Abdominal: Soft, NT, ND, bowel sounds + Extremities: no edema, no cyanosis Psych: Normal mood and affect, stable judgement and insight     The results of significant diagnostics from this hospitalization (including imaging, microbiology, ancillary and laboratory) are listed below for reference.     Microbiology: Recent Results (from the past 240 hour(s))  Blood Culture (routine x 2)     Status: None (Preliminary result)   Collection Time: 07/31/19 12:13 PM   Specimen: BLOOD  Result Value Ref Range Status   Specimen Description BLOOD LEFT ANTECUBITAL  Final   Special Requests AEROBIC BOTTLE ONLY Blood Culture adequate volume  Final   Culture   Final    NO GROWTH 4 DAYS Performed at [facility], [place] [address], [place], [place] [address]    Report Status PENDING  Incomplete  Blood Culture (routine x 2)     Status: None (Preliminary result)   Collection Time: 07/31/19 12:13 PM   Specimen: BLOOD  Result Value Ref Range Status   Specimen Description BLOOD LEFT ANTECUBITAL  Final   Special Requests   Final    BOTTLES DRAWN AEROBIC AND ANAEROBIC Blood Culture adequate volume   Culture   Final    NO GROWTH 4 DAYS Performed at [facility], [place] [address], [place], [place] [address]    Report Status PENDING  Incomplete  SARS Coronavirus 2 by RT PCR (hospital order,  performed in [facility] hospital lab) Nasopharyngeal Nasopharyngeal Swab     Status: None   Collection Time: 07/31/19  1:25 PM   Specimen: Nasopharyngeal Swab  Result Value Ref Range Status   SARS Coronavirus 2 NEGATIVE NEGATIVE Final    Comment: (NOTE) If result is NEGATIVE SARS-CoV-2 target nucleic acids are NOT DETECTED. The SARS-CoV-2 RNA is generally detectable in upper and lower  respiratory specimens during the acute phase of infection. The lowest  concentration of SARS-CoV-2 viral copies this assay can detect is 250  copies / mL. A negative result does not preclude SARS-CoV-2 infection  and should not be used as the sole basis for treatment or other  patient management decisions.  A negative result may occur with  improper specimen collection / handling, submission of specimen other  than nasopharyngeal swab, presence of viral mutation(s) within the  areas targeted by this assay, and inadequate number of viral copies  (<250 copies / mL). A negative result must be combined with clinical  observations, patient history, and epidemiological information. If result is POSITIVE SARS-CoV-2 target nucleic acids are DETECTED. The SARS-CoV-2 RNA is generally detectable in upper and lower  respiratory specimens dur ing the acute phase of infection.  Positive  results are indicative of active infection with SARS-CoV-2.  Clinical  correlation with patient history and other diagnostic information is  necessary to determine patient infection status.  Positive results do  not rule out bacterial infection or co-infection with other viruses. If result is PRESUMPTIVE POSTIVE SARS-CoV-2 nucleic acids MAY BE PRESENT.   A presumptive positive result was obtained on the submitted specimen  and confirmed on repeat testing.  While 2019 novel coronavirus  (SARS-CoV-2) nucleic acids may be present in the submitted sample  additional confirmatory testing may be necessary for epidemiological  and / or  clinical management purposes  to differentiate between  SARS-CoV-2 and other Sarbecovirus currently known to infect humans.  If clinically indicated additional testing with an alternate test  methodology [phone]) is advised. The SARS-CoV-2 RNA is generally  detectable in upper and lower respiratory sp ecimens during the acute  phase of infection. The expected result is Negative. Fact Sheet for Patients:  StrictlyIdeas.no Fact Sheet for Healthcare Providers: BankingDealers.co.za This test is not yet approved or cleared by the [place] FDA and has been authorized for detection and/or diagnosis of SARS-CoV-2 by FDA under an Emergency Use Authorization (EUA).  This EUA will remain in effect (meaning this test can be used) for the duration of the COVID-19 declaration under  Section 564(b)(1) of the Act, 21 U.S.C. section 360bbb-3(b)(1), unless the authorization is terminated or revoked sooner. Performed at [facility], [place] [address] [place], [place] [address]      Labs: BNP (last 3 results) No results for input(s): BNP in the last 8760 hours. Basic Metabolic Panel: Recent Labs  Lab 08/01/19 0231 08/02/19 0720 08/03/19 0253 08/04/19 0228 08/05/19 0203  NA 140 138 136 137 140  K 5.4* 4.1 4.2 4.7 3.9  CL 101 99 100 98 100  CO2 25 24 22  20* 25  GLUCOSE 130* 74 91 127* 82  BUN 51* 24* 33* 48* 17  CREATININE 9.37* 6.07* 8.21* 10.25* 6.43*  CALCIUM 8.7* 8.0* 8.4* 8.3* 8.4*  PHOS  --   --   --  6.9*  --    Liver Function Tests: Recent Labs  Lab 07/31/19 1213 08/01/19 0231 08/04/19 1508  AST 40 25 30  ALT 63* 48* 26  ALKPHOS 147* 132* 99  BILITOT 1.1 0.9 0.9  PROT 7.3 6.8 5.7*  ALBUMIN 3.2* 2.9* 2.3*   Recent Labs  Lab 07/31/19 1213  LIPASE 33   No results for input(s): AMMONIA in the last 168 hours. CBC: Recent Labs  Lab 07/31/19 1213 07/31/19 1251 08/01/19 0231  WBC 10.0  --  8.9  NEUTROABS 8.1*  --    --   HGB 13.5 10.2* 10.1*  HCT 44.2 30.0* 32.1*  MCV 98.9  --  97.0  PLT 209  --  216   Cardiac Enzymes: No results for input(s): CKTOTAL, CKMB, CKMBINDEX, TROPONINI in the last 168 hours. BNP: Invalid input(s): POCBNP CBG: Recent Labs  Lab 08/04/19 1616 08/04/19 2027 08/05/19 0053 08/05/19 0435 08/05/19 0753  GLUCAP 108* 192* 79 83 79   D-Dimer No results for input(s): DDIMER in the last 72 hours. Hgb A1c No results for input(s): HGBA1C in the last 72 hours. Lipid Profile No results for input(s): CHOL, HDL, LDLCALC, TRIG, CHOLHDL, LDLDIRECT in the last 72 hours. Thyroid function studies No results for input(s): TSH, T4TOTAL, T3FREE, THYROIDAB in the last 72 hours.  Invalid input(s): FREET3 Anemia work up No results for input(s): VITAMINB12, FOLATE, FERRITIN, TIBC, IRON, RETICCTPCT in the last 72 hours. Urinalysis    Component Value Date/Time   COLORURINE LT. YELLOW 06/26/2013 1200   APPEARANCEUR CLEAR 06/26/2013 1200   LABSPEC 1.020 06/26/2013 1200   PHURINE 8.5 06/26/2013 1200   GLUCOSEU 100 06/26/2013 1200   HGBUR MODERATE 06/26/2013 1200   BILIRUBINUR NEGATIVE 06/26/2013 1200   KETONESUR NEGATIVE 06/26/2013 1200   PROTEINUR >300 (A) 01/04/2010 1032   UROBILINOGEN 0.2 06/26/2013 1200   NITRITE NEGATIVE 06/26/2013 1200   LEUKOCYTESUR TRACE 06/26/2013 1200   Sepsis Labs Invalid input(s): PROCALCITONIN,  WBC,  LACTICIDVEN Microbiology Recent Results (from the past 240 hour(s))  Blood Culture (routine x 2)     Status: None (Preliminary result)   Collection Time: 07/31/19 12:13 PM   Specimen: BLOOD  Result Value Ref Range Status   Specimen Description BLOOD LEFT ANTECUBITAL  Final   Special Requests AEROBIC BOTTLE ONLY Blood Culture adequate volume  Final   Culture   Final    NO GROWTH 4 DAYS Performed at [facility], [place] [address] [place],  [address]    Report Status PENDING  Incomplete  Blood Culture (routine x 2)     Status: None  (Preliminary result)   Collection Time: 07/31/19 12:13 PM   Specimen: BLOOD  Result Value Ref Range Status  Specimen Description BLOOD LEFT ANTECUBITAL  Final   Special Requests   Final    BOTTLES DRAWN AEROBIC AND ANAEROBIC Blood Culture adequate volume   Culture   Final    NO GROWTH 4 DAYS Performed at [facility], [address] [place], [place] [address]    Report Status PENDING  Incomplete  SARS Coronavirus 2 by RT PCR (hospital order, performed in [facility] hospital lab) Nasopharyngeal Nasopharyngeal Swab     Status: None   Collection Time: 07/31/19  1:25 PM   Specimen: Nasopharyngeal Swab  Result Value Ref Range Status   SARS Coronavirus 2 NEGATIVE NEGATIVE Final    Comment: (NOTE) If result is NEGATIVE SARS-CoV-2 target nucleic acids are NOT DETECTED. The SARS-CoV-2 RNA is generally detectable in upper and lower  respiratory specimens during the acute phase of infection. The lowest  concentration of SARS-CoV-2 viral copies this assay can detect is 250  copies / mL. A negative result does not preclude SARS-CoV-2 infection  and should not be used as the sole basis for treatment or other  patient management decisions.  A negative result may occur with  improper specimen collection / handling, submission of specimen other  than nasopharyngeal swab, presence of viral mutation(s) within the  areas targeted by this assay, and inadequate number of viral copies  (<250 copies / mL). A negative result must be combined with clinical  observations, patient history, and epidemiological information. If result is POSITIVE SARS-CoV-2 target nucleic acids are DETECTED. The SARS-CoV-2 RNA is generally detectable in upper and lower  respiratory specimens dur ing the acute phase of infection.  Positive  results are indicative of active infection with SARS-CoV-2.  Clinical  correlation with patient history and other diagnostic information is  necessary to determine patient  infection status.  Positive results do  not rule out bacterial infection or co-infection with other viruses. If result is PRESUMPTIVE POSTIVE SARS-CoV-2 nucleic acids MAY BE PRESENT.   A presumptive positive result was obtained on the submitted specimen  and confirmed on repeat testing.  While 2019 novel coronavirus  (SARS-CoV-2) nucleic acids may be present in the submitted sample  additional confirmatory testing may be necessary for epidemiological  and / or clinical management purposes  to differentiate between  SARS-CoV-2 and other Sarbecovirus currently known to infect humans.  If clinically indicated additional testing with an alternate test  methodology [phone]) is advised. The SARS-CoV-2 RNA is generally  detectable in upper and lower respiratory sp ecimens during the acute  phase of infection. The expected result is Negative. Fact Sheet for Patients:  StrictlyIdeas.no Fact Sheet for Healthcare Providers: BankingDealers.co.za This test is not yet approved or cleared by the [place] FDA and has been authorized for detection and/or diagnosis of SARS-CoV-2 by FDA under an Emergency Use Authorization (EUA).  This EUA will remain in effect (meaning this test can be used) for the duration of the COVID-19 declaration under Section 564(b)(1) of the Act, 21 U.S.C. section 360bbb-3(b)(1), unless the authorization is terminated or revoked sooner. Performed at [facility], [place] [address] [place], [place] [address]      Patient was seen and examined on the day of discharge and was found to be in stable condition. Time coordinating discharge: 40 minutes including assessment and coordination of care, as well as examination of the patient.   SIGNED:  Dessa Phi, DO [facility] 08/05/2019, 10:24 AM

## 2019-08-05 NOTE — Plan of Care (Signed)
  Problem: Education: Goal: Knowledge of General Education information will improve Description: Including pain rating scale, medication(s)/side effects and non-pharmacologic comfort measures 08/05/2019 1037 by Sherre Poot, RN Outcome: Adequate for Discharge 08/05/2019 0931 by Sherre Poot, RN Outcome: Progressing   Problem: Health Behavior/Discharge Planning: Goal: Ability to manage health-related needs will improve 08/05/2019 1037 by Sherre Poot, RN Outcome: Adequate for Discharge 08/05/2019 0931 by Sherre Poot, RN Outcome: Not Progressing   Problem: Clinical Measurements: Goal: Ability to maintain clinical measurements within normal limits will improve 08/05/2019 1037 by Sherre Poot, RN Outcome: Adequate for Discharge 08/05/2019 0931 by Sherre Poot, RN Outcome: Not Progressing Goal: Will remain free from infection 08/05/2019 1037 by Sherre Poot, RN Outcome: Adequate for Discharge 08/05/2019 0931 by Sherre Poot, RN Outcome: Progressing Goal: Diagnostic test results will improve 08/05/2019 1037 by Sherre Poot, RN Outcome: Adequate for Discharge 08/05/2019 0931 by Sherre Poot, RN Outcome: Progressing Goal: Respiratory complications will improve 08/05/2019 1037 by Sherre Poot, RN Outcome: Adequate for Discharge 08/05/2019 0931 by Sherre Poot, RN Outcome: Progressing Goal: Cardiovascular complication will be avoided 08/05/2019 1037 by Sherre Poot, RN Outcome: Adequate for Discharge 08/05/2019 0931 by Sherre Poot, RN Outcome: Not Progressing   Problem: Activity: Goal: Risk for activity intolerance will decrease 08/05/2019 1037 by Sherre Poot, RN Outcome: Adequate for Discharge 08/05/2019 0931 by Sherre Poot, RN Outcome: Not Progressing   Problem: Elimination: Goal: Will not experience complications related to bowel motility 08/05/2019 1037 by Sherre Poot, RN Outcome: Adequate for  Discharge 08/05/2019 0931 by Sherre Poot, RN Outcome: Not Progressing Goal: Will not experience complications related to urinary retention 08/05/2019 1037 by Sherre Poot, RN Outcome: Adequate for Discharge 08/05/2019 0931 by Sherre Poot, RN Outcome: Not Progressing   Problem: Pain Managment: Goal: General experience of comfort will improve 08/05/2019 1037 by Sherre Poot, RN Outcome: Adequate for Discharge 08/05/2019 0931 by Sherre Poot, RN Outcome: Not Progressing   Problem: Safety: Goal: Ability to remain free from injury will improve 08/05/2019 1037 by Sherre Poot, RN Outcome: Adequate for Discharge 08/05/2019 0931 by Sherre Poot, RN Outcome: Not Progressing

## 2019-08-06 ENCOUNTER — Telehealth: Payer: Self-pay | Admitting: *Deleted

## 2019-08-06 ENCOUNTER — Telehealth: Payer: Self-pay | Admitting: Physician Assistant

## 2019-08-06 DIAGNOSIS — E119 Type 2 diabetes mellitus without complications: Secondary | ICD-10-CM | POA: Diagnosis not present

## 2019-08-06 DIAGNOSIS — D509 Iron deficiency anemia, unspecified: Secondary | ICD-10-CM | POA: Diagnosis not present

## 2019-08-06 DIAGNOSIS — N186 End stage renal disease: Secondary | ICD-10-CM | POA: Diagnosis not present

## 2019-08-06 DIAGNOSIS — N2581 Secondary hyperparathyroidism of renal origin: Secondary | ICD-10-CM | POA: Diagnosis not present

## 2019-08-06 DIAGNOSIS — Z992 Dependence on renal dialysis: Secondary | ICD-10-CM | POA: Diagnosis not present

## 2019-08-06 DIAGNOSIS — D631 Anemia in chronic kidney disease: Secondary | ICD-10-CM | POA: Diagnosis not present

## 2019-08-06 NOTE — Telephone Encounter (Signed)
Called pt concerning hosp f/u appt that's been schedule for 08/12/19. Spoke w/wife they are aware of appt. Inform needed to ask some additional question concerning d/c. Completed TCM questionnaire  below.[name]  Transition Care Management Follow-up Telephone Call   Date discharged? 08/05/19   How have you been since you were released from the hospital? Wife states he is doing alright   Do you understand why you were in the hospital? YES   Do you understand the discharge instructions? YES   Where were you discharged to? Home   Items Reviewed:  Medications reviewed: YES, per wife no changes  Allergies reviewed: YES  Dietary changes reviewed: YES, renal diet  Referrals reviewed: No referral recommeded   Functional Questionnaire:   Activities of Daily Living (ADLs):   She states he are independent in the following: bathing and hygiene, feeding, continence, grooming and toileting States they require assistance with the following: ambulation and dressing   Any transportation issues/concerns?: NO   Any patient concerns? NO   Confirmed importance and date/time of follow-up visits scheduled YES, appt 08/12/19  Provider Appointment booked with Dr. [name]   Confirmed with patient if condition begins to worsen call PCP or go to the ER.  Patient was given the office number and encouraged to call back with question or concerns.  : YES

## 2019-08-06 NOTE — Telephone Encounter (Signed)
Transition of care from inpatient facility  Date of discharge: 08/05/2019 Date of contact: 08/06/2019 Method of contact: Phone  Attempted to contact patient to discuss transition of care from inpatient admission. Patient did not answer the phone. Message was left on the patient's voicemail with call back number.

## 2019-08-07 ENCOUNTER — Telehealth: Payer: Self-pay | Admitting: Internal Medicine

## 2019-08-07 DIAGNOSIS — C61 Malignant neoplasm of prostate: Secondary | ICD-10-CM | POA: Diagnosis not present

## 2019-08-07 DIAGNOSIS — I132 Hypertensive heart and chronic kidney disease with heart failure and with stage 5 chronic kidney disease, or end stage renal disease: Secondary | ICD-10-CM | POA: Diagnosis not present

## 2019-08-07 DIAGNOSIS — I5042 Chronic combined systolic (congestive) and diastolic (congestive) heart failure: Secondary | ICD-10-CM | POA: Diagnosis not present

## 2019-08-07 DIAGNOSIS — A419 Sepsis, unspecified organism: Secondary | ICD-10-CM | POA: Diagnosis not present

## 2019-08-07 DIAGNOSIS — I5082 Biventricular heart failure: Secondary | ICD-10-CM | POA: Diagnosis not present

## 2019-08-07 DIAGNOSIS — N186 End stage renal disease: Secondary | ICD-10-CM | POA: Diagnosis not present

## 2019-08-07 DIAGNOSIS — E1122 Type 2 diabetes mellitus with diabetic chronic kidney disease: Secondary | ICD-10-CM | POA: Diagnosis not present

## 2019-08-07 NOTE — Telephone Encounter (Signed)
Home Health Verbal Orders - Caller/Agency: [name] with [facility] at home  Callback Number: [phone] Requesting OT/PT/Skilled Nursing/Social Work/Speech Therapy: needing verbals for PT  Frequency: 1 week 1 2 week 8

## 2019-08-08 DIAGNOSIS — N186 End stage renal disease: Secondary | ICD-10-CM | POA: Diagnosis not present

## 2019-08-08 DIAGNOSIS — Z992 Dependence on renal dialysis: Secondary | ICD-10-CM | POA: Diagnosis not present

## 2019-08-08 DIAGNOSIS — N2581 Secondary hyperparathyroidism of renal origin: Secondary | ICD-10-CM | POA: Diagnosis not present

## 2019-08-08 DIAGNOSIS — D509 Iron deficiency anemia, unspecified: Secondary | ICD-10-CM | POA: Diagnosis not present

## 2019-08-08 DIAGNOSIS — D631 Anemia in chronic kidney disease: Secondary | ICD-10-CM | POA: Diagnosis not present

## 2019-08-08 DIAGNOSIS — E119 Type 2 diabetes mellitus without complications: Secondary | ICD-10-CM | POA: Diagnosis not present

## 2019-08-09 IMAGING — US US EXTREM UP *R* LTD
1 series · 13 of 13 positions shown · non-contrast
Comparison: None

CLINICAL DATA: Question of fluid collection at dialysis access in
the right upper arm. Bacteremia.

EXAM:
ULTRASOUND RIGHT UPPER EXTREMITY LIMITED
TECHNIQUE: Ultrasound examination of the upper extremity soft tissues was
performed in the area of clinical concern.

[Series 1: us extrem up *right* ltd · 0.06mm/px · 13 acquisitions, 13 frames shown]
[im 1/13]
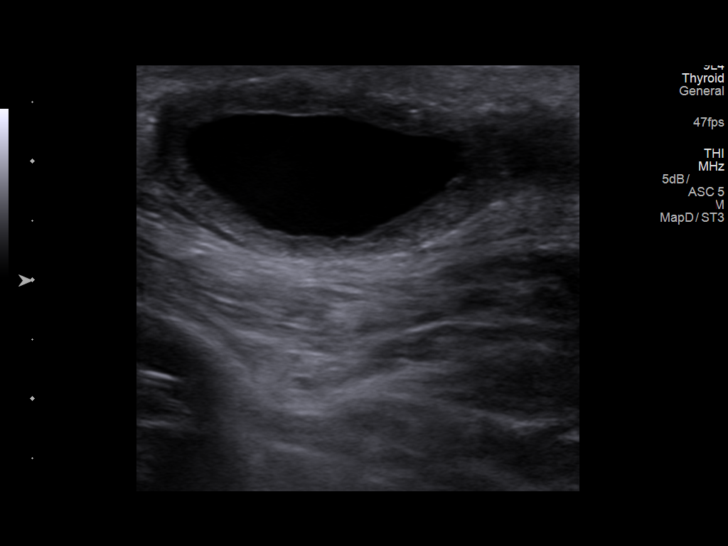
[im 2/13]
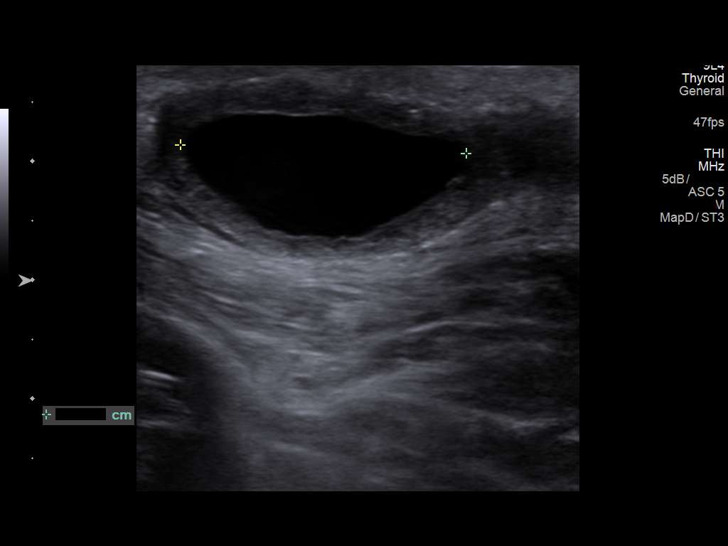
[im 3/13]
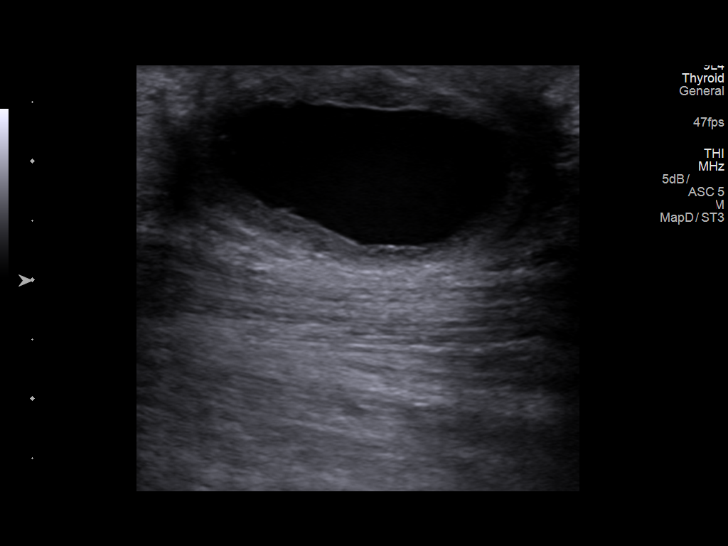
[im 4/13]
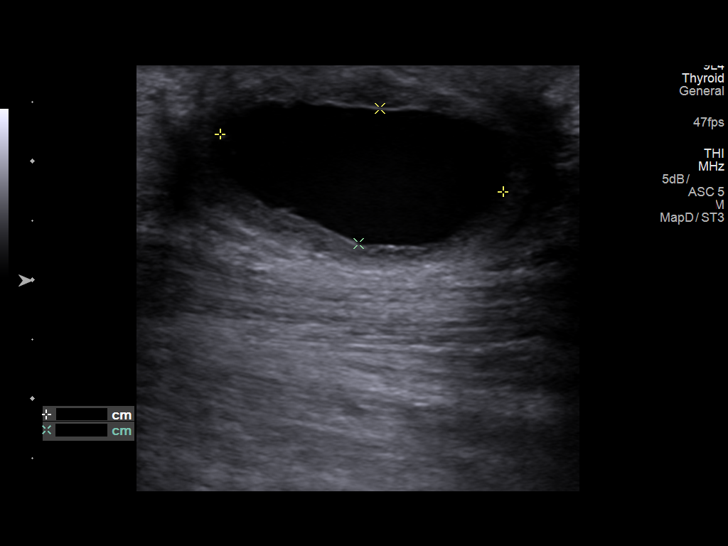
[im 5/13]
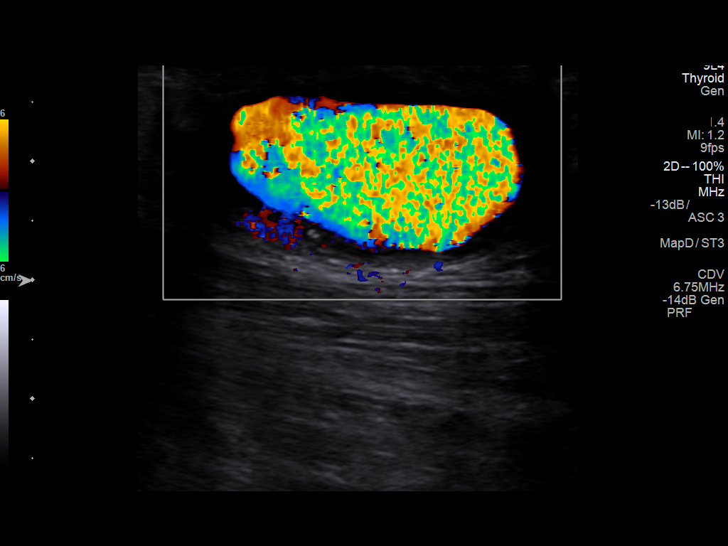
[im 6/13]
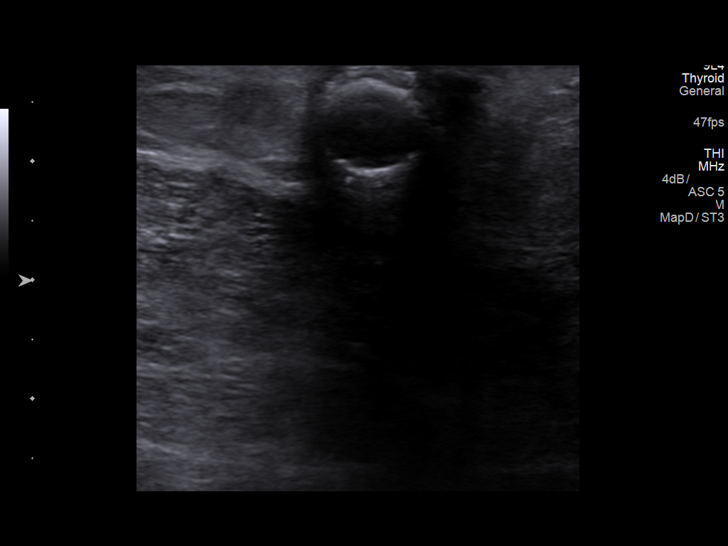
[im 7/13]
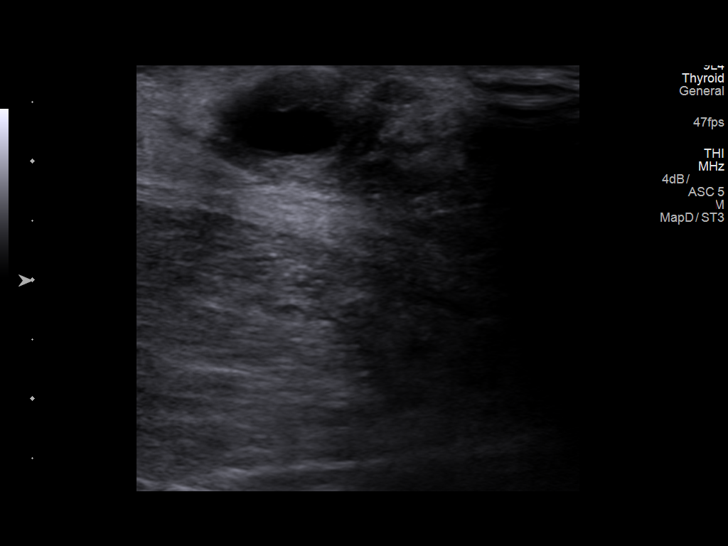
[im 8/13]
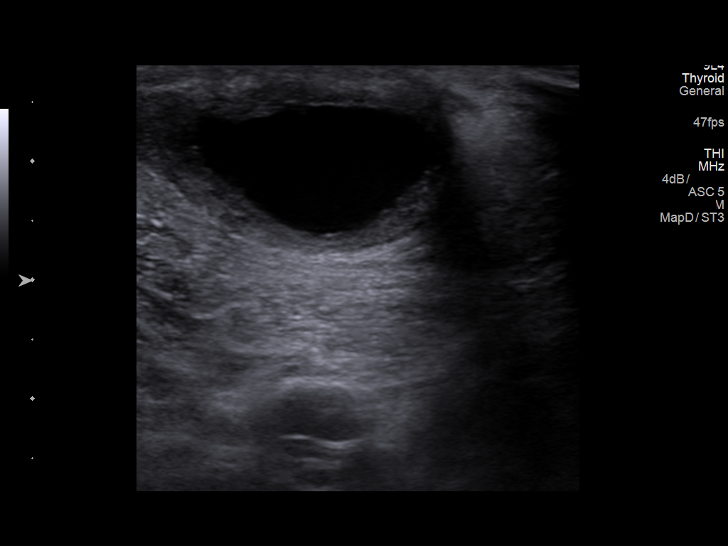
[im 9/13]
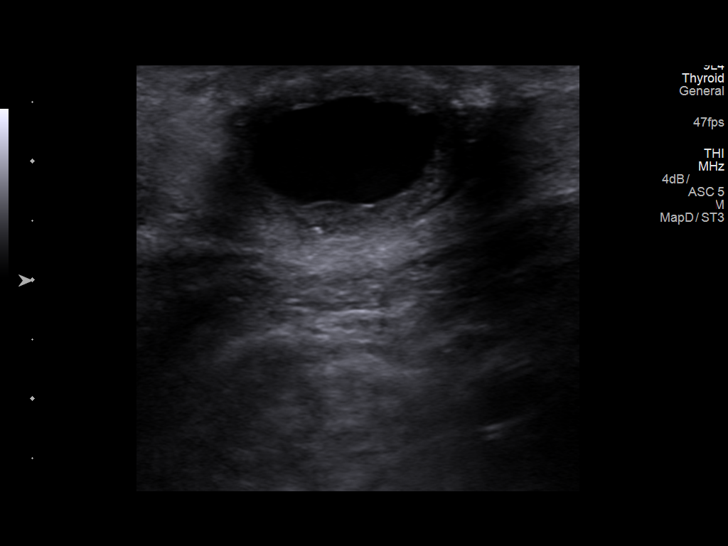
[im 10/13]
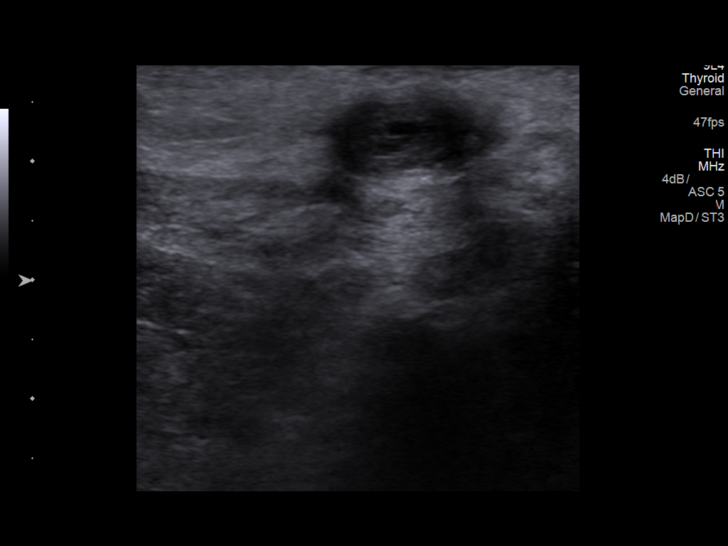
[im 11/13]
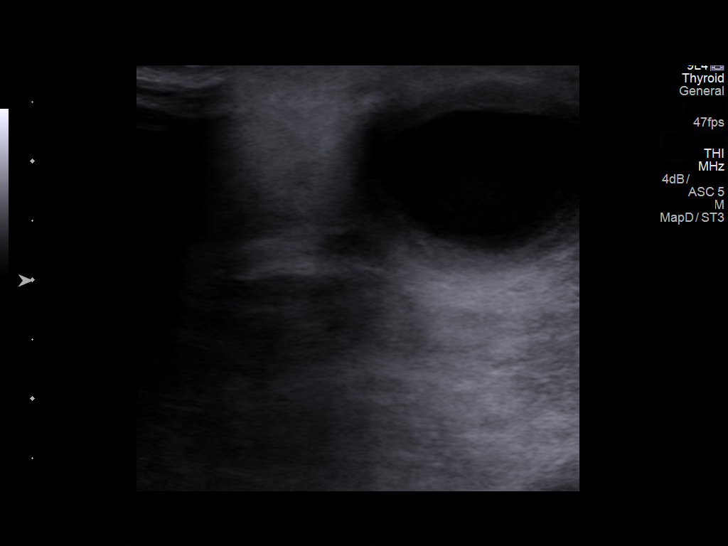
[im 12/13]
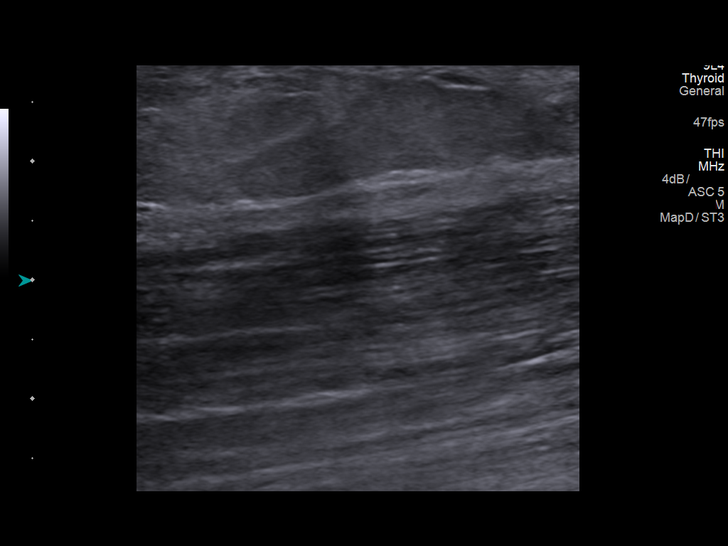
[im 13/13]
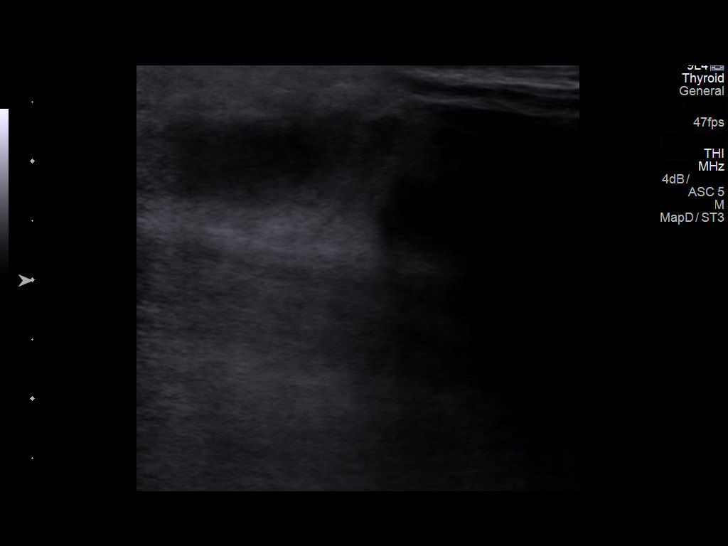

[13 of 13 positions shown; findings below may reference images not displayed]

FINDINGS: No abnormal fluid collection or abscess identified of the right arm.
Patent hemodialysis access site in the right upper arm. A
hemodialysis vascular graft is seen contiguous with the access site.
IMPRESSION: No abnormal fluid collection to suggest abscess or hematoma. Patent
hemodialysis access site in the right upper arm.

## 2019-08-10 ENCOUNTER — Other Ambulatory Visit: Payer: Self-pay

## 2019-08-10 ENCOUNTER — Encounter: Payer: Self-pay | Admitting: Pulmonary Disease

## 2019-08-10 ENCOUNTER — Ambulatory Visit (INDEPENDENT_AMBULATORY_CARE_PROVIDER_SITE_OTHER): Payer: Medicare Other | Admitting: Pulmonary Disease

## 2019-08-10 VITALS — BP 158/70 | HR 94 | Temp 98.1°F | Ht 72.0 in | Wt 231.0 lb

## 2019-08-10 DIAGNOSIS — G4733 Obstructive sleep apnea (adult) (pediatric): Secondary | ICD-10-CM | POA: Diagnosis not present

## 2019-08-10 DIAGNOSIS — I2510 Atherosclerotic heart disease of native coronary artery without angina pectoris: Secondary | ICD-10-CM

## 2019-08-10 DIAGNOSIS — Z9989 Dependence on other enabling machines and devices: Secondary | ICD-10-CM

## 2019-08-10 NOTE — Patient Instructions (Signed)
1year

## 2019-08-10 NOTE — Progress Notes (Signed)
[facility]  Chief Complaint  Patient presents with   Sleep Apnea    on CPAP    Constitutional:  BP (!) 158/70 (BP Location: Left Arm, Patient Position: Sitting, Cuff Size: Normal)    Pulse 94    Temp 98.1 F (36.7 C)    Ht 6' (1.829 m)    Wt 231 lb (104.8 kg)    SpO2 100% Comment: on room air   BMI 31.33 kg/m   Past Medical History:  Allergies, BPH, CAD, Depression, Dementia, DM, Diastolic CHF, HLD, ESRD, GERD, HTN, Prostate cancer, Peripheral neuropathy  Brief Summary:  [name] is a 68 y.o. male with obstructive sleep apnea.  He was seen by [facility] last year.  Uses CPAP nightly.  Gets dry mouth.  Has air leak from mask.  Not having sinus congestion or aerophagia.  Has been in hospital multiple times with infections related to gallstones.  Most recently in September 2020.    Over the past 1 year he has been been getting short of breath easily with exertion.  Recovers after resting for few minutes.  Walking is limited also by unsteady gait, and he uses a walker.  Also gets fatigued after dialysis sessions.  Physical Exam:   Appearance - well kempt   ENMT - clear nasal mucosa, midline nasal  septum, no oral exudates, no LAN, trachea midline  Respiratory - normal chest wall, normal respiratory effort, no accessory muscle use, no wheeze/rales  CV - s1s2 regular rate and rhythm, no murmurs, no peripheral edema, radial pulses symmetric  GI - soft, non tender, no masses  Lymph - no adenopathy noted in neck and axillary areas  MSK - uses a walker  Ext - no cyanosis, clubbing, or joint inflammation noted  Skin - no rashes, lesions, or ulcers  Neuro - normal strength, oriented x 3  Psych - normal mood and affect   Assessment/Plan:   Obstructive sleep apnea. - he is compliant with CPAP and reports benefit - will change to auto CPAP range 5 - 16 cm H2O to see if this improves air leak and mouth dryness    Patient  Instructions  1 year    Chesley Mires, MD [place] Pager: [phone] 08/10/2019, 4:05 PM  Flow Sheet     Pulmonary tests:  PFT 01/12/09 >> FEV1 2.53 (73%), FEV1% 81, TLC 4.99 (70%), DLCO 64%  Sleep tests:  PSG 04/24/06 >> AHI 110.7, SpO2 low 81% Auto CPAP 05/13/19 to 08/10/19 >> used on 88 of 90 nights with average 9 hrs 35 min.  Average AHI 9.4 with median CPAP 13 and 95 th percentile CPAP 18 cm H2O.  Air leak.  Cardiac tests:  TTE 04/02/19 >> EF 60 to 65%  Medications:   Allergies as of 08/10/2019      Reactions   Statins Other (See Comments)   Weak muscles   Ciprofloxacin Rash      Medication List       Accurate as of August 10, 2019  4:05 PM. If you have any questions, ask your nurse or doctor.        acetaminophen 325 MG tablet Commonly known as: TYLENOL Take 325 mg by mouth every 6 (six) hours as needed for mild pain or headache.   allopurinol 100 MG tablet Commonly known as: ZYLOPRIM Take 1 tablet by mouth once daily What changed: when to take this   aspirin EC 325 MG tablet Take 1 tablet (  325 mg total) by mouth daily.   betamethasone dipropionate 0.05 % cream Apply 1 application topically daily as needed (leg sores).   cinacalcet 90 MG tablet Commonly known as: SENSIPAR Take 90 mg by mouth 2 (two) times daily.   colestipol 1 g tablet Commonly known as: COLESTID Take 3 tablets (3 g total) by mouth daily.   doxercalciferol 4 MCG/2ML injection Commonly known as: [name] Inject 1 mL (2 mcg total) into the vein Every Tuesday,Thursday,and Saturday with dialysis.   ezetimibe 10 MG tablet Commonly known as: ZETIA Take 10 mg by mouth at bedtime.   ferric citrate 1 GM 210 MG(Fe) tablet Commonly known as: AURYXIA Take 420 mg by mouth 3 (three) times daily with meals.   gabapentin 300 MG capsule Commonly known as: NEURONTIN TAKE 1 TO 2 CAPSULES BY MOUTH AT BEDTIME AS NEEDED FOR PAIN AND FOR SLEEP What changed: See the new  instructions.   hydroxypropyl methylcellulose / hypromellose 2.5 % ophthalmic solution Commonly known as: ISOPTO TEARS / GONIOVISC Place 1 drop into both eyes 3 (three) times daily as needed for dry eyes.   methimazole 5 MG tablet Commonly known as: TAPAZOLE Take 1/2 (one-half) tablet by mouth once daily What changed: See the new instructions.   midodrine 10 MG tablet Commonly known as: PROAMATINE Take 10 mg by mouth See admin instructions. Take 10mg  every Tuesday, Thursday and Saturday   multivitamin Tabs tablet Take 1 tablet by mouth daily.   pantoprazole 40 MG tablet Commonly known as: PROTONIX Take 40 mg by mouth at bedtime.   polyethylene glycol 17 g packet Commonly known as: MIRALAX / GLYCOLAX Take 17 g by mouth daily as needed for mild constipation.   Praluent 150 MG/ML [facility] Generic drug: Alirocumab Inject 1 pen into the skin every 14 (fourteen) days.   zolpidem 5 MG tablet Commonly known as: AMBIEN Take 1 tablet (5 mg total) by mouth at bedtime as needed. for sleep What changed:   reasons to take this  additional instructions       Past Surgical History:  He  has a past surgical history that includes Coronary angioplasty with stent (06/11/2008); Cervical spine surgery (2/09); Back surgery (1998); Esophagogastroduodenoscopy (09/28/2011); Givens capsule study (09/30/2011); Cholecystectomy; Coronary angioplasty with stent (06/2007); Tonsillectomy; AV fistula placement (11/07/2011); Arteriovenous graft placement (Right, 2009); Insertion of dialysis catheter (Right, 2014); Insertion of dialysis catheter (Left, 02/11/2013); Removal of a dialysis catheter (Right, 02/11/2013); Bascilic vein transposition (Right, 02/27/2013); shuntogram (N/A, 09/20/2011); venogram (N/A, 01/26/2013); Esophagogastroduodenoscopy (N/A, 04/07/2015); Savory dilation (N/A, 04/07/2015); Foreign Body Removal (09/2003); Esophagogastroduodenoscopy (N/A, 04/19/2015); Total knee arthroplasty (Right, 08/02/2015);  Cardiac catheterization (N/A, 08/06/2016); Cardiac catheterization (N/A, 08/07/2016); SVT ABLATION (N/A, 11/26/2016); Colonoscopy with propofol (N/A, 04/26/2017); Flexible sigmoidoscopy (N/A, 05/21/2017); Flexible sigmoidoscopy (Left, 07/02/2017); Lumbar laminectomy/decompression microdiscectomy (Bilateral, 07/31/2017); A/V SHUNTOGRAM (N/A, 04/07/2018); PERIPHERAL VASCULAR BALLOON ANGIOPLASTY (04/07/2018); Esophagogastroduodenoscopy (egd) with propofol (N/A, 06/13/2018); TEE without cardioversion (N/A, 08/25/2018); IR US Guide Vasc Access Right (11/14/2018); IR AV DIALY SHUNT INTRO NEEDLE/INTRACATH INITIAL W/PTA/IMG RIGHT (Right, 11/14/2018); ERCP (N/A, 11/14/2018); Balloon dilation (N/A, 11/14/2018); sphincterotomy (11/14/2018); removal of stones (11/14/2018); Esophagogastroduodenoscopy (11/14/2018); Endoscopic retrograde cholangiopancreatography (ercp) with propofol (N/A, 03/31/2019); Sphincterotomy (03/31/2019); removal of stones (03/31/2019); biliary stent placement (03/31/2019); Biliary dilation (03/31/2019); TEE without cardioversion (N/A, 04/02/2019); ERCP (N/A, 05/20/2019); Spyglass cholangioscopy (N/A, 05/20/2019); spyglass lithotripsy (N/A, 05/20/2019); sphincterotomy (05/20/2019); Stent removal (05/20/2019); biliary stent placement (N/A, 05/20/2019); Biliary dilation (05/20/2019); ERCP (N/A, 08/03/2019); Stent removal (08/03/2019); biliary stent placement (08/03/2019); and removal of stones (08/03/2019).  Family History:  His family history includes Diabetes in his father; Healthy in his child, child, and child; Heart disease in his father and sister; Hypertension in his father; Kidney failure in his father; Thyroid nodules in his sister.  Social History:  He  reports that he quit smoking about 13 years ago. His smoking use included cigarettes. He has a 25.00 pack-year smoking history. He quit smokeless tobacco use about 13 years ago. He reports that he does not drink alcohol or use drugs.

## 2019-08-10 NOTE — Telephone Encounter (Signed)
Verbal orders given via VM 

## 2019-08-11 DIAGNOSIS — E119 Type 2 diabetes mellitus without complications: Secondary | ICD-10-CM | POA: Diagnosis not present

## 2019-08-11 DIAGNOSIS — D509 Iron deficiency anemia, unspecified: Secondary | ICD-10-CM | POA: Diagnosis not present

## 2019-08-11 DIAGNOSIS — N186 End stage renal disease: Secondary | ICD-10-CM | POA: Diagnosis not present

## 2019-08-11 DIAGNOSIS — N2581 Secondary hyperparathyroidism of renal origin: Secondary | ICD-10-CM | POA: Diagnosis not present

## 2019-08-11 DIAGNOSIS — Z992 Dependence on renal dialysis: Secondary | ICD-10-CM | POA: Diagnosis not present

## 2019-08-11 DIAGNOSIS — D631 Anemia in chronic kidney disease: Secondary | ICD-10-CM | POA: Diagnosis not present

## 2019-08-12 ENCOUNTER — Other Ambulatory Visit (INDEPENDENT_AMBULATORY_CARE_PROVIDER_SITE_OTHER): Payer: Medicare Other

## 2019-08-12 ENCOUNTER — Other Ambulatory Visit: Payer: Self-pay

## 2019-08-12 ENCOUNTER — Telehealth: Payer: Self-pay

## 2019-08-12 ENCOUNTER — Ambulatory Visit (INDEPENDENT_AMBULATORY_CARE_PROVIDER_SITE_OTHER): Payer: Medicare Other | Admitting: Internal Medicine

## 2019-08-12 ENCOUNTER — Encounter: Payer: Self-pay | Admitting: Internal Medicine

## 2019-08-12 VITALS — BP 142/86 | HR 100 | Temp 98.6°F | Ht 72.0 in | Wt 231.0 lb

## 2019-08-12 DIAGNOSIS — R109 Unspecified abdominal pain: Secondary | ICD-10-CM | POA: Diagnosis not present

## 2019-08-12 DIAGNOSIS — F32A Depression, unspecified: Secondary | ICD-10-CM

## 2019-08-12 DIAGNOSIS — E1122 Type 2 diabetes mellitus with diabetic chronic kidney disease: Secondary | ICD-10-CM | POA: Diagnosis not present

## 2019-08-12 DIAGNOSIS — E538 Deficiency of other specified B group vitamins: Secondary | ICD-10-CM | POA: Diagnosis not present

## 2019-08-12 DIAGNOSIS — E611 Iron deficiency: Secondary | ICD-10-CM

## 2019-08-12 DIAGNOSIS — M109 Gout, unspecified: Secondary | ICD-10-CM

## 2019-08-12 DIAGNOSIS — K8050 Calculus of bile duct without cholangitis or cholecystitis without obstruction: Secondary | ICD-10-CM

## 2019-08-12 DIAGNOSIS — F329 Major depressive disorder, single episode, unspecified: Secondary | ICD-10-CM | POA: Diagnosis not present

## 2019-08-12 DIAGNOSIS — N184 Chronic kidney disease, stage 4 (severe): Secondary | ICD-10-CM

## 2019-08-12 DIAGNOSIS — Z9861 Coronary angioplasty status: Secondary | ICD-10-CM

## 2019-08-12 DIAGNOSIS — F0280 Dementia in other diseases classified elsewhere without behavioral disturbance: Secondary | ICD-10-CM

## 2019-08-12 DIAGNOSIS — Z8546 Personal history of malignant neoplasm of prostate: Secondary | ICD-10-CM

## 2019-08-12 DIAGNOSIS — I2510 Atherosclerotic heart disease of native coronary artery without angina pectoris: Secondary | ICD-10-CM | POA: Diagnosis not present

## 2019-08-12 DIAGNOSIS — I5042 Chronic combined systolic (congestive) and diastolic (congestive) heart failure: Secondary | ICD-10-CM | POA: Diagnosis not present

## 2019-08-12 DIAGNOSIS — Z9181 History of falling: Secondary | ICD-10-CM

## 2019-08-12 DIAGNOSIS — I255 Ischemic cardiomyopathy: Secondary | ICD-10-CM | POA: Diagnosis not present

## 2019-08-12 DIAGNOSIS — Z87891 Personal history of nicotine dependence: Secondary | ICD-10-CM

## 2019-08-12 DIAGNOSIS — E559 Vitamin D deficiency, unspecified: Secondary | ICD-10-CM | POA: Diagnosis not present

## 2019-08-12 DIAGNOSIS — J449 Chronic obstructive pulmonary disease, unspecified: Secondary | ICD-10-CM | POA: Diagnosis not present

## 2019-08-12 DIAGNOSIS — Z86010 Personal history of colonic polyps: Secondary | ICD-10-CM

## 2019-08-12 DIAGNOSIS — I5082 Biventricular heart failure: Secondary | ICD-10-CM | POA: Diagnosis not present

## 2019-08-12 DIAGNOSIS — Z6830 Body mass index (BMI) 30.0-30.9, adult: Secondary | ICD-10-CM

## 2019-08-12 DIAGNOSIS — E039 Hypothyroidism, unspecified: Secondary | ICD-10-CM

## 2019-08-12 DIAGNOSIS — N400 Enlarged prostate without lower urinary tract symptoms: Secondary | ICD-10-CM

## 2019-08-12 DIAGNOSIS — M48062 Spinal stenosis, lumbar region with neurogenic claudication: Secondary | ICD-10-CM

## 2019-08-12 DIAGNOSIS — E669 Obesity, unspecified: Secondary | ICD-10-CM

## 2019-08-12 DIAGNOSIS — I252 Old myocardial infarction: Secondary | ICD-10-CM

## 2019-08-12 DIAGNOSIS — I132 Hypertensive heart and chronic kidney disease with heart failure and with stage 5 chronic kidney disease, or end stage renal disease: Secondary | ICD-10-CM | POA: Diagnosis not present

## 2019-08-12 DIAGNOSIS — M5412 Radiculopathy, cervical region: Secondary | ICD-10-CM

## 2019-08-12 DIAGNOSIS — I10 Essential (primary) hypertension: Secondary | ICD-10-CM | POA: Diagnosis not present

## 2019-08-12 DIAGNOSIS — E1142 Type 2 diabetes mellitus with diabetic polyneuropathy: Secondary | ICD-10-CM

## 2019-08-12 DIAGNOSIS — K8309 Other cholangitis: Secondary | ICD-10-CM | POA: Diagnosis not present

## 2019-08-12 DIAGNOSIS — E1151 Type 2 diabetes mellitus with diabetic peripheral angiopathy without gangrene: Secondary | ICD-10-CM | POA: Diagnosis not present

## 2019-08-12 DIAGNOSIS — D631 Anemia in chronic kidney disease: Secondary | ICD-10-CM | POA: Diagnosis not present

## 2019-08-12 DIAGNOSIS — Z8673 Personal history of transient ischemic attack (TIA), and cerebral infarction without residual deficits: Secondary | ICD-10-CM

## 2019-08-12 DIAGNOSIS — N186 End stage renal disease: Secondary | ICD-10-CM | POA: Diagnosis not present

## 2019-08-12 DIAGNOSIS — Z992 Dependence on renal dialysis: Secondary | ICD-10-CM

## 2019-08-12 LAB — LIPID PANEL
Cholesterol: 222 mg/dL — ABNORMAL HIGH (ref 0–200)
HDL: 54.6 mg/dL (ref >39.00)
NonHDL: 167.55
Total CHOL/HDL Ratio: 4
Triglycerides: 318 mg/dL — ABNORMAL HIGH (ref 0.0–149.0)
VLDL: 63.6 mg/dL — ABNORMAL HIGH (ref 0.0–40.0)

## 2019-08-12 LAB — CBC WITH DIFFERENTIAL/PLATELET
Basophils Absolute: 0.1 10*3/uL (ref 0.0–0.1)
Basophils Relative: 1.5 % (ref 0.0–3.0)
Eosinophils Absolute: 0.2 10*3/uL (ref 0.0–0.7)
Eosinophils Relative: 3 % (ref 0.0–5.0)
HCT: 35.2 % — ABNORMAL LOW (ref 39.0–52.0)
Hemoglobin: 11.3 g/dL — ABNORMAL LOW (ref 13.0–17.0)
Lymphocytes Relative: 23.8 % (ref 12.0–46.0)
Lymphs Abs: 1.7 10*3/uL (ref 0.7–4.0)
MCHC: 32.2 g/dL (ref 30.0–36.0)
MCV: 98 fl (ref 78.0–100.0)
Monocytes Absolute: 0.8 10*3/uL (ref 0.1–1.0)
Monocytes Relative: 11.2 % (ref 3.0–12.0)
Neutro Abs: 4.4 10*3/uL (ref 1.4–7.7)
Neutrophils Relative %: 60.5 % (ref 43.0–77.0)
Platelets: 383 10*3/uL (ref 150.0–400.0)
RBC: 3.59 Mil/uL — ABNORMAL LOW (ref 4.22–5.81)
RDW: 18.8 % — ABNORMAL HIGH (ref 11.5–15.5)
WBC: 7.2 10*3/uL (ref 4.0–10.5)

## 2019-08-12 LAB — IBC PANEL
Iron: 60 ug/dL (ref 42–165)
Saturation Ratios: 28.2 % (ref 20.0–50.0)
Transferrin: 152 mg/dL — ABNORMAL LOW (ref 212.0–360.0)

## 2019-08-12 LAB — BASIC METABOLIC PANEL
BUN: 24 mg/dL — ABNORMAL HIGH (ref 6–23)
CO2: 33 mEq/L — ABNORMAL HIGH (ref 19–32)
Calcium: 9.3 mg/dL (ref 8.4–10.5)
Chloride: 98 mEq/L (ref 96–112)
Creatinine, Ser: 7.86 mg/dL (ref 0.40–1.50)
GFR: 8.27 mL/min — CL (ref >60.00)
Glucose, Bld: 215 mg/dL — ABNORMAL HIGH (ref 70–99)
Potassium: 3.8 mEq/L (ref 3.5–5.1)
Sodium: 143 mEq/L (ref 135–145)

## 2019-08-12 LAB — VITAMIN B12: Vitamin B-12: 834 pg/mL (ref 211–911)

## 2019-08-12 LAB — LDL CHOLESTEROL, DIRECT: Direct LDL: 112 mg/dL

## 2019-08-12 LAB — HEPATIC FUNCTION PANEL
ALT: 20 U/L (ref 0–53)
AST: 18 U/L (ref 0–37)
Albumin: 4 g/dL (ref 3.5–5.2)
Alkaline Phosphatase: 160 U/L — ABNORMAL HIGH (ref 39–117)
Bilirubin, Direct: 0.1 mg/dL (ref 0.0–0.3)
Total Bilirubin: 0.4 mg/dL (ref 0.2–1.2)
Total Protein: 7.8 g/dL (ref 6.0–8.3)

## 2019-08-12 LAB — VITAMIN D 25 HYDROXY (VIT D DEFICIENCY, FRACTURES): VITD: 23.74 ng/mL — ABNORMAL LOW (ref 30.00–100.00)

## 2019-08-12 LAB — HEMOGLOBIN A1C: Hgb A1c MFr Bld: 6.2 % (ref 4.6–6.5)

## 2019-08-12 LAB — LIPASE: Lipase: 59 U/L (ref 11.0–59.0)

## 2019-08-12 MED ORDER — CITALOPRAM HYDROBROMIDE 10 MG PO TABS: 10.0000 mg | ORAL_TABLET | Freq: Every day | ORAL | 3 refills | Status: AC

## 2019-08-12 NOTE — Telephone Encounter (Signed)
Received critical lab value for pt of creatinine-7.86 and GFR-8.27

## 2019-08-12 NOTE — Progress Notes (Signed)
Subjective:    Patient ID: [name], male    DOB: 02-05-1951, 68 y.o.   MRN: [id number]  HPI  Here to f/u recent hospn oct 30 - nov 4; 68 y.o.malewith medical history significant forbut not limited to dementia, diabetes mellitus type 2, ESRD on HD T/TH/S, CAD, CHF, presenting with few days history of generalized weakness and mental status change associated with fever of 100.4, mild tachycardia, hypotensive. COVID-19 was negative, no acute infiltrate on chest x-ray. Patient started on IV antibiotics admitted for early sepsis.  He was found to have cholangitis, choledocholithiasis.  GI was consulted and patient underwent ERCP with multiple stones removed, old stent removed.  New biliary stent was placed.  Patient had improvement in his LFT .  Pt to follow-up with repeat ERCP with Dr. Watt Climes in 4 to 6 weeks for removal of residual CBD stones, and antibiotics now completed.  HD completed and remained euvolemic. Pt advised Continue aspirin but is No longer on Plavix due to rectal bleeding from possible radiation proctitis.  Since at home, Does c/o ongoing fatigue, but denies signficant daytime hypersomnolence, remains compliant with CPAP.  Does c/o worsening depressive symptoms due to ongoing medical issues that never end, but no suicidal ideation, or panic.  Denies worsening reflux, abd pain, dysphagia, n/v, bowel change or blood.  Plans to f/u with Dr Watt Climes soon Past Medical History:  Diagnosis Date  . Acute encephalopathy 10/04/2018  . Allergic rhinitis, cause unspecified 02/24/2014  . Anemia 06/16/2011  . BENIGN PROSTATIC HYPERTROPHY 10/14/2009  . CAD, NATIVE VESSEL 02/06/2009   a. 06/2007 s/p Taxus DES to the RCA;  b. 08/2016 NSTEMI in setting of SVT/PCI: LM 30ost, LAD 71m (3.0x16 Synergy DES), LCX 85m, OM1 60, RI 40, RCA 70p/m, 40m - not amenable to PCI.  [name] Cervical radiculopathy, chronic 02/23/2016   Right c5-6 by NCS/EMG  . Chest pain 08/09/2015  . Chronic combined systolic (congestive) and  diastolic (congestive) heart failure ([place])    a. 10/2016 Echo: EF 40-45%, Gr2 DD. mildly dil LA.  [name] COLONIC POLYPS, HX OF 10/14/2009  . Dementia ([place]) [id number]  . Depression 09/24/2015  . DIABETES MELLITUS, TYPE II 02/01/2010  . DYSLIPIDEMIA 06/18/2007  . ESRD (end stage renal disease) on dialysis ([place]) 08/04/2010   ESRD due to DM adn HTN, started HD in 2011. As of 2019 has a R arm graft and gets HD on TTS sched  . FOOT PAIN 08/12/2008  . GAIT DISTURBANCE 03/03/2010  . GASTROENTERITIS, VIRAL 10/14/2009  . GERD 06/18/2007  . GOITER, MULTINODULAR 12/26/2007  . GOUT 06/18/2007  . GYNECOMASTIA 07/17/2010  . Hemodialysis access, fistula mature [facility])    Dialysis T-Th-Sa ([place]) Right upper arm fistula  . Hyperlipidemia 10/16/2011  . Hyperparathyroidism, secondary ([place]) 06/16/2011  . HYPERTENSION 06/18/2007  . Hyperthyroidism   . Hypocalcemia 06/07/2010  . Ischemic cardiomyopathy    a. 10/2016 Echo: EF 40-45%.  . Lumbar stenosis with neurogenic claudication   . ONYCHOMYCOSIS, TOENAILS 12/26/2007  . OSA on CPAP 10/16/2011  . Other malaise and fatigue 11/24/2009  . PERIPHERAL NEUROPATHY 06/18/2007  . Prostate cancer ([place])   . PSVT (paroxysmal supraventricular tachycardia) ([place])    a. 87/8676 complicated by NSTEMI;  b. 11/2016 Treated w/ adenosine in ED;  c. 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  [name] PULMONARY NODULE, RIGHT LOWER LOBE 06/08/2009  . Sleep apnea    cpap machine and o2  . TRANSAMINASES, SERUM, ELEVATED 02/01/2010  . Transfusion history  none recent  . Unspecified hypotension 01/30/2010   Past Surgical History:  Procedure Laterality Date  . A/V SHUNTOGRAM N/A 04/07/2018   Procedure: A/V SHUNTOGRAM;  Surgeon: Waynetta Sandy, MD;  Location: [place] CV LAB;  Service: Cardiovascular;  Laterality: N/A;  . ARTERIOVENOUS GRAFT PLACEMENT Right 2009   forearm/notes 02/01/2011  . AV FISTULA PLACEMENT  11/07/2011   Procedure: INSERTION OF ARTERIOVENOUS (AV) GORE-TEX GRAFT ARM;   Surgeon: Tinnie Gens, MD;  Location: [place];  Service: Vascular;  Laterality: Left;  . BACK SURGERY  1998  . BALLOON DILATION N/A 11/14/2018   Procedure: BALLOON DILATION;  Surgeon: Ronnette Juniper, MD;  Location: [place];  Service: Gastroenterology;  Laterality: N/A;  . BASCILIC VEIN TRANSPOSITION Right 02/27/2013   Procedure: BASCILIC VEIN TRANSPOSITION;  Surgeon: Mal Misty, MD;  Location: [place];  Service: Vascular;  Laterality: Right;  Right Basilic Vein Transposition   . BILIARY DILATION  03/31/2019   Procedure: BILIARY DILATION;  Surgeon: Clarene Essex, MD;  Location: [facility] ENDOSCOPY;  Service: Endoscopy;;  . BILIARY DILATION  05/20/2019   Procedure: BILIARY DILATION;  Surgeon: Clarene Essex, MD;  Location: [facility] ENDOSCOPY;  Service: Endoscopy;;  . BILIARY STENT PLACEMENT  03/31/2019   Procedure: BILIARY STENT PLACEMENT;  Surgeon: Clarene Essex, MD;  Location: [facility] ENDOSCOPY;  Service: Endoscopy;;  . BILIARY STENT PLACEMENT N/A 05/20/2019   Procedure: BILIARY STENT PLACEMENT;  Surgeon: Clarene Essex, MD;  Location: [facility] ENDOSCOPY;  Service: Endoscopy;  Laterality: N/A;  . BILIARY STENT PLACEMENT  08/03/2019   Procedure: BILIARY STENT PLACEMENT;  Surgeon: Arta Silence, MD;  Location: [place];  Service: Endoscopy;;  . CARDIAC CATHETERIZATION N/A 08/06/2016   Procedure: Left Heart Cath and Coronary Angiography;  Surgeon: Jolaine Artist, MD;  Location: [place] CV LAB;  Service: Cardiovascular;  Laterality: N/A;  . CARDIAC CATHETERIZATION N/A 08/07/2016   Procedure: Coronary/Graft Atherectomy-CSI LAD;  Surgeon: Peter M Martinique, MD;  Location: [place] CV LAB;  Service: Cardiovascular;  Laterality: N/A;  . CERVICAL SPINE SURGERY  2/09   "to repair nerve problems in my left arm"  . CHOLECYSTECTOMY    . COLONOSCOPY WITH PROPOFOL N/A 04/26/2017   Procedure: COLONOSCOPY WITH PROPOFOL;  Surgeon: Otis Brace, MD;  Location: [place];  Service: Gastroenterology;  Laterality: N/A;  . CORONARY  ANGIOPLASTY WITH STENT PLACEMENT  06/11/2008  . CORONARY ANGIOPLASTY WITH STENT PLACEMENT  06/2007   TAXUS stent to RCA/notes 01/31/2011  . ENDOSCOPIC RETROGRADE CHOLANGIOPANCREATOGRAPHY (ERCP) WITH PROPOFOL N/A 03/31/2019   Procedure: ENDOSCOPIC RETROGRADE CHOLANGIOPANCREATOGRAPHY (ERCP) WITH PROPOFOL;  Surgeon: Clarene Essex, MD;  Location: [place];  Service: Endoscopy;  Laterality: N/A;  . ERCP N/A 11/14/2018   Procedure: ENDOSCOPIC RETROGRADE CHOLANGIOPANCREATOGRAPHY (ERCP);  Surgeon: Ronnette Juniper, MD;  Location: [place];  Service: Gastroenterology;  Laterality: N/A;  . ERCP N/A 05/20/2019   Procedure: ENDOSCOPIC RETROGRADE CHOLANGIOPANCREATOGRAPHY (ERCP);  Surgeon: Clarene Essex, MD;  Location: [facility];  Service: Endoscopy;  Laterality: N/A;  . ERCP N/A 08/03/2019   Procedure: ENDOSCOPIC RETROGRADE CHOLANGIOPANCREATOGRAPHY (ERCP);  Surgeon: Arta Silence, MD;  Location: [facility] ENDOSCOPY;  Service: Endoscopy;  Laterality: N/A;  . ESOPHAGOGASTRODUODENOSCOPY  09/28/2011   Procedure: ESOPHAGOGASTRODUODENOSCOPY (EGD);  Surgeon: Missy Sabins, MD;  Location: [facility] ENDOSCOPY;  Service: Endoscopy;  Laterality: N/A;  . ESOPHAGOGASTRODUODENOSCOPY N/A 04/07/2015   Procedure: ESOPHAGOGASTRODUODENOSCOPY (EGD);  Surgeon: Teena Irani, MD;  Location: [facility];  Service: Endoscopy;  Laterality: N/A;  . ESOPHAGOGASTRODUODENOSCOPY N/A 04/19/2015   Procedure: ESOPHAGOGASTRODUODENOSCOPY (EGD);  Surgeon: Arta Silence, MD;  Location: [facility] ENDOSCOPY;  Service: Endoscopy;  Laterality: N/A;  . ESOPHAGOGASTRODUODENOSCOPY  11/14/2018   Procedure: ESOPHAGOGASTRODUODENOSCOPY (EGD);  Surgeon: Ronnette Juniper, MD;  Location: [facility] ENDOSCOPY;  Service: Gastroenterology;;  . ESOPHAGOGASTRODUODENOSCOPY (EGD) WITH PROPOFOL N/A 06/13/2018   Procedure: ESOPHAGOGASTRODUODENOSCOPY (EGD) WITH PROPOFOL;  Surgeon: Wonda Horner, MD;  Location: [facility] ENDOSCOPY;  Service: Endoscopy;  Laterality: N/A;  . FLEXIBLE SIGMOIDOSCOPY N/A 05/21/2017    Procedure: Conni Elliot;  Surgeon: Clarene Essex, MD;  Location: [place];  Service: Endoscopy;  Laterality: N/A;  . FLEXIBLE SIGMOIDOSCOPY Left 07/02/2017   Procedure: FLEXIBLE SIGMOIDOSCOPY;  Surgeon: Laurence Spates, MD;  Location: [place];  Service: Endoscopy;  Laterality: Left;  . FOREIGN BODY REMOVAL  09/2003   via upper endoscopy/notes 02/12/2011  . GIVENS CAPSULE STUDY  09/30/2011   Procedure: GIVENS CAPSULE STUDY;  Surgeon: Jeryl Columbia, MD;  Location: [facility] ENDOSCOPY;  Service: Endoscopy;  Laterality: N/A;  . INSERTION OF DIALYSIS CATHETER Right 2014  . INSERTION OF DIALYSIS CATHETER Left 02/11/2013   Procedure: INSERTION OF DIALYSIS CATHETER;  Surgeon: Conrad Owendale, MD;  Location: [place];  Service: Vascular;  Laterality: Left;  Ultrasound guided  . IR AV DIALY SHUNT INTRO NEEDLE/INTRACATH INITIAL W/PTA/IMG RIGHT Right 11/14/2018  . IR US GUIDE VASC ACCESS RIGHT  11/14/2018  . LUMBAR LAMINECTOMY/DECOMPRESSION MICRODISCECTOMY Bilateral 07/31/2017   Procedure: LAMINECTOMY AND FORAMINOTOMY- BILATERAL LUMBAR TWO- LUMBAR THREE;  Surgeon: Earnie Larsson, MD;  Location: [place];  Service: Neurosurgery;  Laterality: Bilateral;  LAMINECTOMY AND FORAMINOTOMY- BILATERAL LUMBAR 2- LUMBAR 3  . PERIPHERAL VASCULAR BALLOON ANGIOPLASTY  04/07/2018   Procedure: PERIPHERAL VASCULAR BALLOON ANGIOPLASTY;  Surgeon: Waynetta Sandy, MD;  Location: [place] CV LAB;  Service: Cardiovascular;;  RUE AVF  . REMOVAL OF A DIALYSIS CATHETER Right 02/11/2013   Procedure: REMOVAL OF A DIALYSIS CATHETER;  Surgeon: Conrad Alamogordo, MD;  Location: [place];  Service: Vascular;  Laterality: Right;  . REMOVAL OF STONES  11/14/2018   Procedure: REMOVAL OF STONES;  Surgeon: Ronnette Juniper, MD;  Location: [facility] ENDOSCOPY;  Service: Gastroenterology;;  . REMOVAL OF STONES  03/31/2019   Procedure: REMOVAL OF STONES;  Surgeon: Clarene Essex, MD;  Location: [facility] ENDOSCOPY;  Service: Endoscopy;;  . REMOVAL OF STONES  08/03/2019    Procedure: REMOVAL OF STONES;  Surgeon: Arta Silence, MD;  Location: [place] ENDOSCOPY;  Service: Endoscopy;;  . Azzie Almas DILATION N/A 04/07/2015   Procedure: Azzie Almas DILATION;  Surgeon: Teena Irani, MD;  [facility] ENDOSCOPY;  Service: Endoscopy;  Laterality: N/A;  . SHUNTOGRAM N/A 09/20/2011   Procedure: Earney Mallet;  Surgeon: Conrad Lewellen, MD;  Location: [facility] CATH LAB;  Service: Cardiovascular;  Laterality: N/A;  . SPHINCTEROTOMY  11/14/2018   Procedure: SPHINCTEROTOMY;  Surgeon: Ronnette Juniper, MD;  Location: [facility] ENDOSCOPY;  Service: Gastroenterology;;  . Joan Mayans  03/31/2019   Procedure: Joan Mayans;  Surgeon: Clarene Essex, MD;  Location: [facility] ENDOSCOPY;  Service: Endoscopy;;  . SPHINCTEROTOMY  05/20/2019   Procedure: Joan Mayans;  Surgeon: Clarene Essex, MD;  [facility] ENDOSCOPY;  Service: Endoscopy;;  . SPYGLASS CHOLANGIOSCOPY N/A 05/20/2019   Procedure: NTIRWERX CHOLANGIOSCOPY;  Surgeon: Clarene Essex, MD;  [facility] ENDOSCOPY;  Service: Endoscopy;  Laterality: N/A;  Bess Kinds LITHOTRIPSY N/A 05/20/2019   Procedure: VQMGQQPY LITHOTRIPSY;  Surgeon: Clarene Essex, MD;  [facility] ENDOSCOPY;  Service: Endoscopy;  Laterality: N/A;  . STENT REMOVAL  05/20/2019   Procedure: STENT REMOVAL;  Surgeon: Clarene Essex, MD;  Location: [facility] ENDOSCOPY;  Service: Endoscopy;;  . STENT REMOVAL  08/03/2019   Procedure: STENT REMOVAL;  Surgeon: Arta Silence, MD;  Location: [facility] ENDOSCOPY;  Service: Endoscopy;;  . SVT ABLATION N/A 11/26/2016   Procedure: SVT Ablation;  Surgeon: Will Meredith Leeds, MD;  Location: [place] CV LAB;  Service: Cardiovascular;  Laterality: N/A;  . TEE WITHOUT CARDIOVERSION N/A 08/25/2018   Procedure: TRANSESOPHAGEAL ECHOCARDIOGRAM (TEE);  Surgeon: Dorothy Spark, MD;  Location: [place];  Service: Cardiovascular;  Laterality: N/A;  . TEE WITHOUT CARDIOVERSION N/A 04/02/2019   Procedure: TRANSESOPHAGEAL ECHOCARDIOGRAM (TEE);  Surgeon: Josue Hector, MD;  Location: [facility]  ENDOSCOPY;  Service: Cardiovascular;  Laterality: N/A;  . TONSILLECTOMY    . TOTAL KNEE ARTHROPLASTY Right 08/02/2015   Procedure: TOTAL KNEE ARTHROPLASTY;  Surgeon: Renette Butters, MD;  Location: [place];  Service: Orthopedics;  Laterality: Right;  . VENOGRAM N/A 01/26/2013   Procedure: VENOGRAM;  Surgeon: Angelia Mould, MD;  Location: [facility] CATH LAB;  Service: Cardiovascular;  Laterality: N/A;    reports that he quit smoking about 13 years ago. His smoking use included cigarettes. He has a 25.00 pack-year smoking history. He quit smokeless tobacco use about 13 years ago. He reports that he does not drink alcohol or use drugs. family history includes Diabetes in his father; Healthy in his child, child, and child; Heart disease in his father and sister; Hypertension in his father; Kidney failure in his father; Thyroid nodules in his sister. Allergies  Allergen Reactions  . Statins Other (See Comments)    Weak muscles  . Ciprofloxacin Rash   Current Outpatient Medications on File Prior to Visit  Medication Sig Dispense Refill  . acetaminophen (TYLENOL) 325 MG tablet Take 325 mg by mouth every 6 (six) hours as needed for mild pain or headache.     . Alirocumab (PRALUENT) 150 MG/ML SOAJ Inject 1 pen into the skin every 14 (fourteen) days. 6 pen 3  . allopurinol (ZYLOPRIM) 100 MG tablet Take 1 tablet by mouth once daily (Patient taking differently: Take 100 mg by mouth at bedtime. ) 90 tablet 0  . aspirin EC 325 MG tablet Take 1 tablet (325 mg total) by mouth daily. 30 tablet 0  . betamethasone dipropionate (DIPROLENE) 0.05 % cream Apply 1 application topically daily as needed (leg sores).    . cinacalcet (SENSIPAR) 90 MG tablet Take 90 mg by mouth 2 (two) times daily.    . colestipol (COLESTID) 1 g tablet Take 3 tablets (3 g total) by mouth daily. 90 tablet 11  . doxercalciferol ([name]) 4 MCG/2ML injection Inject 1 mL (2 mcg total) into the vein Every Tuesday,Thursday,and Saturday with  dialysis. 2 mL 0  . ezetimibe (ZETIA) 10 MG tablet Take 10 mg by mouth at bedtime.     . ferric citrate (AURYXIA) 1 GM 210 MG(Fe) tablet Take 420 mg by mouth 3 (three) times daily with meals.     . gabapentin (NEURONTIN) 300 MG capsule TAKE 1 TO 2 CAPSULES BY MOUTH AT BEDTIME AS NEEDED FOR PAIN AND FOR SLEEP (Patient taking differently: Take 600 mg by mouth at bedtime. ) 180 capsule 0  . hydroxypropyl methylcellulose / hypromellose (ISOPTO TEARS / GONIOVISC) 2.5 % ophthalmic solution Place 1 drop into both eyes 3 (three) times daily as needed for dry eyes. 15 mL 11  . methimazole (TAPAZOLE) 5 MG tablet Take 1/2 (one-half) tablet by mouth once daily (Patient taking differently: Take 2.5 mg by mouth daily. ) 45 tablet 0  . midodrine (PROAMATINE) 10 MG tablet Take 10  mg by mouth See admin instructions. Take 10mg  every Tuesday, Thursday and Saturday     . multivitamin (RENA-VIT) TABS tablet Take 1 tablet by mouth daily.     . pantoprazole (PROTONIX) 40 MG tablet Take 40 mg by mouth at bedtime.     . polyethylene glycol (MIRALAX / GLYCOLAX) 17 g packet Take 17 g by mouth daily as needed for mild constipation.    [name] zolpidem (AMBIEN) 5 MG tablet Take 1 tablet (5 mg total) by mouth at bedtime as needed. for sleep (Patient taking differently: Take 5 mg by mouth at bedtime as needed for sleep. ) 90 tablet 1   No current facility-administered medications on file prior to visit.    Review of Systems  Constitutional: Negative for other unusual diaphoresis or sweats HENT: Negative for ear discharge or swelling Eyes: Negative for other worsening visual disturbances Respiratory: Negative for stridor or other swelling  Gastrointestinal: Negative for worsening distension or other blood Genitourinary: Negative for retention or other urinary change Musculoskeletal: Negative for other MSK pain or swelling Skin: Negative for color change or other new lesions Neurological: Negative for worsening tremors and other  numbness  Psychiatric/Behavioral: Negative for worsening agitation or other fatigue All otherwise neg per pt     Objective:   Physical Exam BP (!) 142/86   Pulse 100   Temp 98.6 F (37 C) (Oral)   Ht 6' (1.829 m)   Wt 231 lb (104.8 kg)   SpO2 95%   BMI 31.33 kg/m  VS noted,  Constitutional: Pt appears in NAD HENT: Head: NCAT.  Right Ear: External ear normal.  Left Ear: External ear normal.  Eyes: . Pupils are equal, round, and reactive to light. Conjunctivae and EOM are normal Nose: without d/c or deformity Neck: Neck supple. Gross normal ROM Cardiovascular: Normal rate and regular rhythm.   Pulmonary/Chest: Effort normal and breath sounds without rales or wheezing.  Abd:  Soft, NT, ND, + BS, no organomegaly Neurological: Pt is alert. At baseline orientation, motor grossly intact Skin: Skin is warm. No rashes, other new lesions, no LE edema Psychiatric: Pt behavior is normal without agitation , depressed affect and mood All otherwise neg per pt Lab Results  Component Value Date   WBC 8.9 08/01/2019   HGB 10.1 (L) 08/01/2019   HCT 32.1 (L) 08/01/2019   PLT 216 08/01/2019   GLUCOSE 82 08/05/2019   CHOL 171 10/04/2018   TRIG 185 (H) 10/04/2018   HDL 66 10/04/2018   LDLDIRECT 136.0 07/16/2014   LDLCALC 68 10/04/2018   ALT 26 08/04/2019   AST 30 08/04/2019   NA 140 08/05/2019   K 3.9 08/05/2019   CL 100 08/05/2019   CREATININE 6.43 (H) 08/05/2019   BUN 17 08/05/2019   CO2 25 08/05/2019   TSH 0.564 07/31/2019   PSA 3.67 08/30/2014   INR 1.1 07/31/2019   HGBA1C 6.0 (A) 06/01/2019      Assessment & Plan:

## 2019-08-12 NOTE — Assessment & Plan Note (Signed)
For f/u labs today

## 2019-08-12 NOTE — Assessment & Plan Note (Signed)
stable overall by history and exam, recent data reviewed with pt, and pt to continue medical treatment as before,  to f/u any worsening symptoms or concerns  

## 2019-08-12 NOTE — Patient Instructions (Addendum)
Please take all new medication as prescribed - the celexa 10 mg per day  Please continue all other medications as before, and refills have been done if requested.  Please have the pharmacy call with any other refills you may need.  Please continue your efforts at being more active, low cholesterol diet, and weight control.  You are otherwise up to date with prevention measures today.  Please keep your appointments with your specialists as you may have planned - Dr Watt Climes  Please go to the LAB in the Basement (turn left off the elevator) for the tests to be done today  You will be contacted by phone if any changes need to be made immediately.  Otherwise, you will receive a letter about your results with an explanation, but please check with MyChart first.  Please remember to sign up for MyChart if you have not done so, as this will be important to you in the future with finding out test results, communicating by private email, and scheduling acute appointments online when needed.  Please return in 4 months, or sooner if needed

## 2019-08-12 NOTE — Assessment & Plan Note (Signed)
Mild to mod, for celexa 10 qd  to f/u any worsening symptoms or concerns

## 2019-08-12 NOTE — Assessment & Plan Note (Signed)
Pt to f/u with GI as planned

## 2019-08-13 ENCOUNTER — Other Ambulatory Visit: Payer: Self-pay | Admitting: Internal Medicine

## 2019-08-13 DIAGNOSIS — D631 Anemia in chronic kidney disease: Secondary | ICD-10-CM | POA: Diagnosis not present

## 2019-08-13 DIAGNOSIS — Z992 Dependence on renal dialysis: Secondary | ICD-10-CM | POA: Diagnosis not present

## 2019-08-13 DIAGNOSIS — E119 Type 2 diabetes mellitus without complications: Secondary | ICD-10-CM | POA: Diagnosis not present

## 2019-08-13 DIAGNOSIS — N2581 Secondary hyperparathyroidism of renal origin: Secondary | ICD-10-CM | POA: Diagnosis not present

## 2019-08-13 DIAGNOSIS — N186 End stage renal disease: Secondary | ICD-10-CM | POA: Diagnosis not present

## 2019-08-13 DIAGNOSIS — D509 Iron deficiency anemia, unspecified: Secondary | ICD-10-CM | POA: Diagnosis not present

## 2019-08-13 MED ORDER — VITAMIN D (ERGOCALCIFEROL) 1.25 MG (50000 UNIT) PO CAPS: 50000.0000 [IU] | ORAL_CAPSULE | ORAL | 0 refills | Status: AC

## 2019-08-14 DIAGNOSIS — C61 Malignant neoplasm of prostate: Secondary | ICD-10-CM | POA: Diagnosis not present

## 2019-08-15 DIAGNOSIS — D631 Anemia in chronic kidney disease: Secondary | ICD-10-CM | POA: Diagnosis not present

## 2019-08-15 DIAGNOSIS — Z992 Dependence on renal dialysis: Secondary | ICD-10-CM | POA: Diagnosis not present

## 2019-08-15 DIAGNOSIS — D509 Iron deficiency anemia, unspecified: Secondary | ICD-10-CM | POA: Diagnosis not present

## 2019-08-15 DIAGNOSIS — N2581 Secondary hyperparathyroidism of renal origin: Secondary | ICD-10-CM | POA: Diagnosis not present

## 2019-08-15 DIAGNOSIS — E119 Type 2 diabetes mellitus without complications: Secondary | ICD-10-CM | POA: Diagnosis not present

## 2019-08-15 DIAGNOSIS — N186 End stage renal disease: Secondary | ICD-10-CM | POA: Diagnosis not present

## 2019-08-17 DIAGNOSIS — I5082 Biventricular heart failure: Secondary | ICD-10-CM | POA: Diagnosis not present

## 2019-08-17 DIAGNOSIS — I5042 Chronic combined systolic (congestive) and diastolic (congestive) heart failure: Secondary | ICD-10-CM | POA: Diagnosis not present

## 2019-08-17 DIAGNOSIS — I132 Hypertensive heart and chronic kidney disease with heart failure and with stage 5 chronic kidney disease, or end stage renal disease: Secondary | ICD-10-CM | POA: Diagnosis not present

## 2019-08-17 DIAGNOSIS — N186 End stage renal disease: Secondary | ICD-10-CM | POA: Diagnosis not present

## 2019-08-17 DIAGNOSIS — E1122 Type 2 diabetes mellitus with diabetic chronic kidney disease: Secondary | ICD-10-CM | POA: Diagnosis not present

## 2019-08-17 DIAGNOSIS — K8309 Other cholangitis: Secondary | ICD-10-CM | POA: Diagnosis not present

## 2019-08-18 DIAGNOSIS — E119 Type 2 diabetes mellitus without complications: Secondary | ICD-10-CM | POA: Diagnosis not present

## 2019-08-18 DIAGNOSIS — D631 Anemia in chronic kidney disease: Secondary | ICD-10-CM | POA: Diagnosis not present

## 2019-08-18 DIAGNOSIS — N186 End stage renal disease: Secondary | ICD-10-CM | POA: Diagnosis not present

## 2019-08-18 DIAGNOSIS — D509 Iron deficiency anemia, unspecified: Secondary | ICD-10-CM | POA: Diagnosis not present

## 2019-08-18 DIAGNOSIS — Z992 Dependence on renal dialysis: Secondary | ICD-10-CM | POA: Diagnosis not present

## 2019-08-18 DIAGNOSIS — N2581 Secondary hyperparathyroidism of renal origin: Secondary | ICD-10-CM | POA: Diagnosis not present

## 2019-08-19 DIAGNOSIS — K8309 Other cholangitis: Secondary | ICD-10-CM | POA: Diagnosis not present

## 2019-08-19 DIAGNOSIS — I132 Hypertensive heart and chronic kidney disease with heart failure and with stage 5 chronic kidney disease, or end stage renal disease: Secondary | ICD-10-CM | POA: Diagnosis not present

## 2019-08-19 DIAGNOSIS — I5082 Biventricular heart failure: Secondary | ICD-10-CM | POA: Diagnosis not present

## 2019-08-19 DIAGNOSIS — I5042 Chronic combined systolic (congestive) and diastolic (congestive) heart failure: Secondary | ICD-10-CM | POA: Diagnosis not present

## 2019-08-19 DIAGNOSIS — N186 End stage renal disease: Secondary | ICD-10-CM | POA: Diagnosis not present

## 2019-08-19 DIAGNOSIS — E1122 Type 2 diabetes mellitus with diabetic chronic kidney disease: Secondary | ICD-10-CM | POA: Diagnosis not present

## 2019-08-20 ENCOUNTER — Telehealth: Payer: Self-pay | Admitting: Internal Medicine

## 2019-08-20 DIAGNOSIS — N2581 Secondary hyperparathyroidism of renal origin: Secondary | ICD-10-CM | POA: Diagnosis not present

## 2019-08-20 DIAGNOSIS — N186 End stage renal disease: Secondary | ICD-10-CM | POA: Diagnosis not present

## 2019-08-20 DIAGNOSIS — Z992 Dependence on renal dialysis: Secondary | ICD-10-CM | POA: Diagnosis not present

## 2019-08-20 DIAGNOSIS — E119 Type 2 diabetes mellitus without complications: Secondary | ICD-10-CM | POA: Diagnosis not present

## 2019-08-20 DIAGNOSIS — D509 Iron deficiency anemia, unspecified: Secondary | ICD-10-CM | POA: Diagnosis not present

## 2019-08-20 DIAGNOSIS — D631 Anemia in chronic kidney disease: Secondary | ICD-10-CM | POA: Diagnosis not present

## 2019-08-20 NOTE — Telephone Encounter (Signed)
[name] with [facility] is calling in to check on the status of an order that was sent in August for PT. They have not received a signature  Please assist.   CB: [phone] - ext [place]

## 2019-08-20 NOTE — Telephone Encounter (Signed)
I have called [name] and left a VM with side b fax number to have the orders sent from August that she hasn't received yet.

## 2019-08-22 DIAGNOSIS — N2581 Secondary hyperparathyroidism of renal origin: Secondary | ICD-10-CM | POA: Diagnosis not present

## 2019-08-22 DIAGNOSIS — E119 Type 2 diabetes mellitus without complications: Secondary | ICD-10-CM | POA: Diagnosis not present

## 2019-08-22 DIAGNOSIS — D631 Anemia in chronic kidney disease: Secondary | ICD-10-CM | POA: Diagnosis not present

## 2019-08-22 DIAGNOSIS — N186 End stage renal disease: Secondary | ICD-10-CM | POA: Diagnosis not present

## 2019-08-22 DIAGNOSIS — D509 Iron deficiency anemia, unspecified: Secondary | ICD-10-CM | POA: Diagnosis not present

## 2019-08-22 DIAGNOSIS — Z992 Dependence on renal dialysis: Secondary | ICD-10-CM | POA: Diagnosis not present

## 2019-08-24 ENCOUNTER — Other Ambulatory Visit: Payer: Self-pay | Admitting: Pharmacy Technician

## 2019-08-24 ENCOUNTER — Other Ambulatory Visit: Payer: Self-pay | Admitting: Gastroenterology

## 2019-08-24 DIAGNOSIS — D631 Anemia in chronic kidney disease: Secondary | ICD-10-CM | POA: Diagnosis not present

## 2019-08-24 DIAGNOSIS — N186 End stage renal disease: Secondary | ICD-10-CM | POA: Diagnosis not present

## 2019-08-24 DIAGNOSIS — D509 Iron deficiency anemia, unspecified: Secondary | ICD-10-CM | POA: Diagnosis not present

## 2019-08-24 DIAGNOSIS — N2581 Secondary hyperparathyroidism of renal origin: Secondary | ICD-10-CM | POA: Diagnosis not present

## 2019-08-24 DIAGNOSIS — Z992 Dependence on renal dialysis: Secondary | ICD-10-CM | POA: Diagnosis not present

## 2019-08-24 DIAGNOSIS — E119 Type 2 diabetes mellitus without complications: Secondary | ICD-10-CM | POA: Diagnosis not present

## 2019-08-24 NOTE — Patient Outreach (Signed)
[facility]) Care Management  08/24/2019  LABRANDON KNOCH 1951-06-04 [id number]   Incoming call received in regards to 2021 re enrollment with [employer] for [employer].  Spoke to patient's wife, HIPAA identifiers verified.  Patient called to inquire if she needed to re enroll her husband for Zetia with [employer] patient assistance.   Informed patient's wife that [employer] has pulled Zetia from their program. Informed patient's wife to outreach his insurance company to inquire if the generic Zetia has been moved to a different lower tier which would make the cost more affordable. Also informed patient's wife to contact us or husband's doctor in 2021 if when they pick up the Zetia if the cost is too much for them and options could be discussed at that time. Patient's wife verbalized understanding.  An inbasket message was sent to [place] as an Juluis Rainier.  Tayven Renteria P. Jyrah Blye, [place] Management [phone]

## 2019-08-25 DIAGNOSIS — I5082 Biventricular heart failure: Secondary | ICD-10-CM | POA: Diagnosis not present

## 2019-08-25 DIAGNOSIS — I132 Hypertensive heart and chronic kidney disease with heart failure and with stage 5 chronic kidney disease, or end stage renal disease: Secondary | ICD-10-CM | POA: Diagnosis not present

## 2019-08-25 DIAGNOSIS — N186 End stage renal disease: Secondary | ICD-10-CM | POA: Diagnosis not present

## 2019-08-25 DIAGNOSIS — I5042 Chronic combined systolic (congestive) and diastolic (congestive) heart failure: Secondary | ICD-10-CM | POA: Diagnosis not present

## 2019-08-25 DIAGNOSIS — E1122 Type 2 diabetes mellitus with diabetic chronic kidney disease: Secondary | ICD-10-CM | POA: Diagnosis not present

## 2019-08-25 DIAGNOSIS — K8309 Other cholangitis: Secondary | ICD-10-CM | POA: Diagnosis not present

## 2019-08-26 DIAGNOSIS — N2581 Secondary hyperparathyroidism of renal origin: Secondary | ICD-10-CM | POA: Diagnosis not present

## 2019-08-26 DIAGNOSIS — N186 End stage renal disease: Secondary | ICD-10-CM | POA: Diagnosis not present

## 2019-08-26 DIAGNOSIS — Z992 Dependence on renal dialysis: Secondary | ICD-10-CM | POA: Diagnosis not present

## 2019-08-26 DIAGNOSIS — D631 Anemia in chronic kidney disease: Secondary | ICD-10-CM | POA: Diagnosis not present

## 2019-08-26 DIAGNOSIS — E119 Type 2 diabetes mellitus without complications: Secondary | ICD-10-CM | POA: Diagnosis not present

## 2019-08-26 DIAGNOSIS — D509 Iron deficiency anemia, unspecified: Secondary | ICD-10-CM | POA: Diagnosis not present

## 2019-08-28 DIAGNOSIS — E1122 Type 2 diabetes mellitus with diabetic chronic kidney disease: Secondary | ICD-10-CM | POA: Diagnosis not present

## 2019-08-28 DIAGNOSIS — I5042 Chronic combined systolic (congestive) and diastolic (congestive) heart failure: Secondary | ICD-10-CM | POA: Diagnosis not present

## 2019-08-28 DIAGNOSIS — N186 End stage renal disease: Secondary | ICD-10-CM | POA: Diagnosis not present

## 2019-08-28 DIAGNOSIS — I5082 Biventricular heart failure: Secondary | ICD-10-CM | POA: Diagnosis not present

## 2019-08-28 DIAGNOSIS — I132 Hypertensive heart and chronic kidney disease with heart failure and with stage 5 chronic kidney disease, or end stage renal disease: Secondary | ICD-10-CM | POA: Diagnosis not present

## 2019-08-28 DIAGNOSIS — K8309 Other cholangitis: Secondary | ICD-10-CM | POA: Diagnosis not present

## 2019-08-29 DIAGNOSIS — D509 Iron deficiency anemia, unspecified: Secondary | ICD-10-CM | POA: Diagnosis not present

## 2019-08-29 DIAGNOSIS — E119 Type 2 diabetes mellitus without complications: Secondary | ICD-10-CM | POA: Diagnosis not present

## 2019-08-29 DIAGNOSIS — Z992 Dependence on renal dialysis: Secondary | ICD-10-CM | POA: Diagnosis not present

## 2019-08-29 DIAGNOSIS — D631 Anemia in chronic kidney disease: Secondary | ICD-10-CM | POA: Diagnosis not present

## 2019-08-29 DIAGNOSIS — N186 End stage renal disease: Secondary | ICD-10-CM | POA: Diagnosis not present

## 2019-08-29 DIAGNOSIS — N2581 Secondary hyperparathyroidism of renal origin: Secondary | ICD-10-CM | POA: Diagnosis not present

## 2019-08-31 DIAGNOSIS — N186 End stage renal disease: Secondary | ICD-10-CM | POA: Diagnosis not present

## 2019-08-31 DIAGNOSIS — I5082 Biventricular heart failure: Secondary | ICD-10-CM | POA: Diagnosis not present

## 2019-08-31 DIAGNOSIS — I5042 Chronic combined systolic (congestive) and diastolic (congestive) heart failure: Secondary | ICD-10-CM | POA: Diagnosis not present

## 2019-08-31 DIAGNOSIS — E1122 Type 2 diabetes mellitus with diabetic chronic kidney disease: Secondary | ICD-10-CM | POA: Diagnosis not present

## 2019-08-31 DIAGNOSIS — K8309 Other cholangitis: Secondary | ICD-10-CM | POA: Diagnosis not present

## 2019-08-31 DIAGNOSIS — K8050 Calculus of bile duct without cholangitis or cholecystitis without obstruction: Secondary | ICD-10-CM | POA: Diagnosis not present

## 2019-08-31 DIAGNOSIS — I132 Hypertensive heart and chronic kidney disease with heart failure and with stage 5 chronic kidney disease, or end stage renal disease: Secondary | ICD-10-CM | POA: Diagnosis not present

## 2019-09-02 IMAGING — DX DG CHEST 1V PORT
1 series · 1 of 1 positions shown · non-contrast
Comparison: October 03, 2018

CLINICAL DATA: Fever and altered mental status

EXAM:
PORTABLE CHEST 1 VIEW

[chest ap]
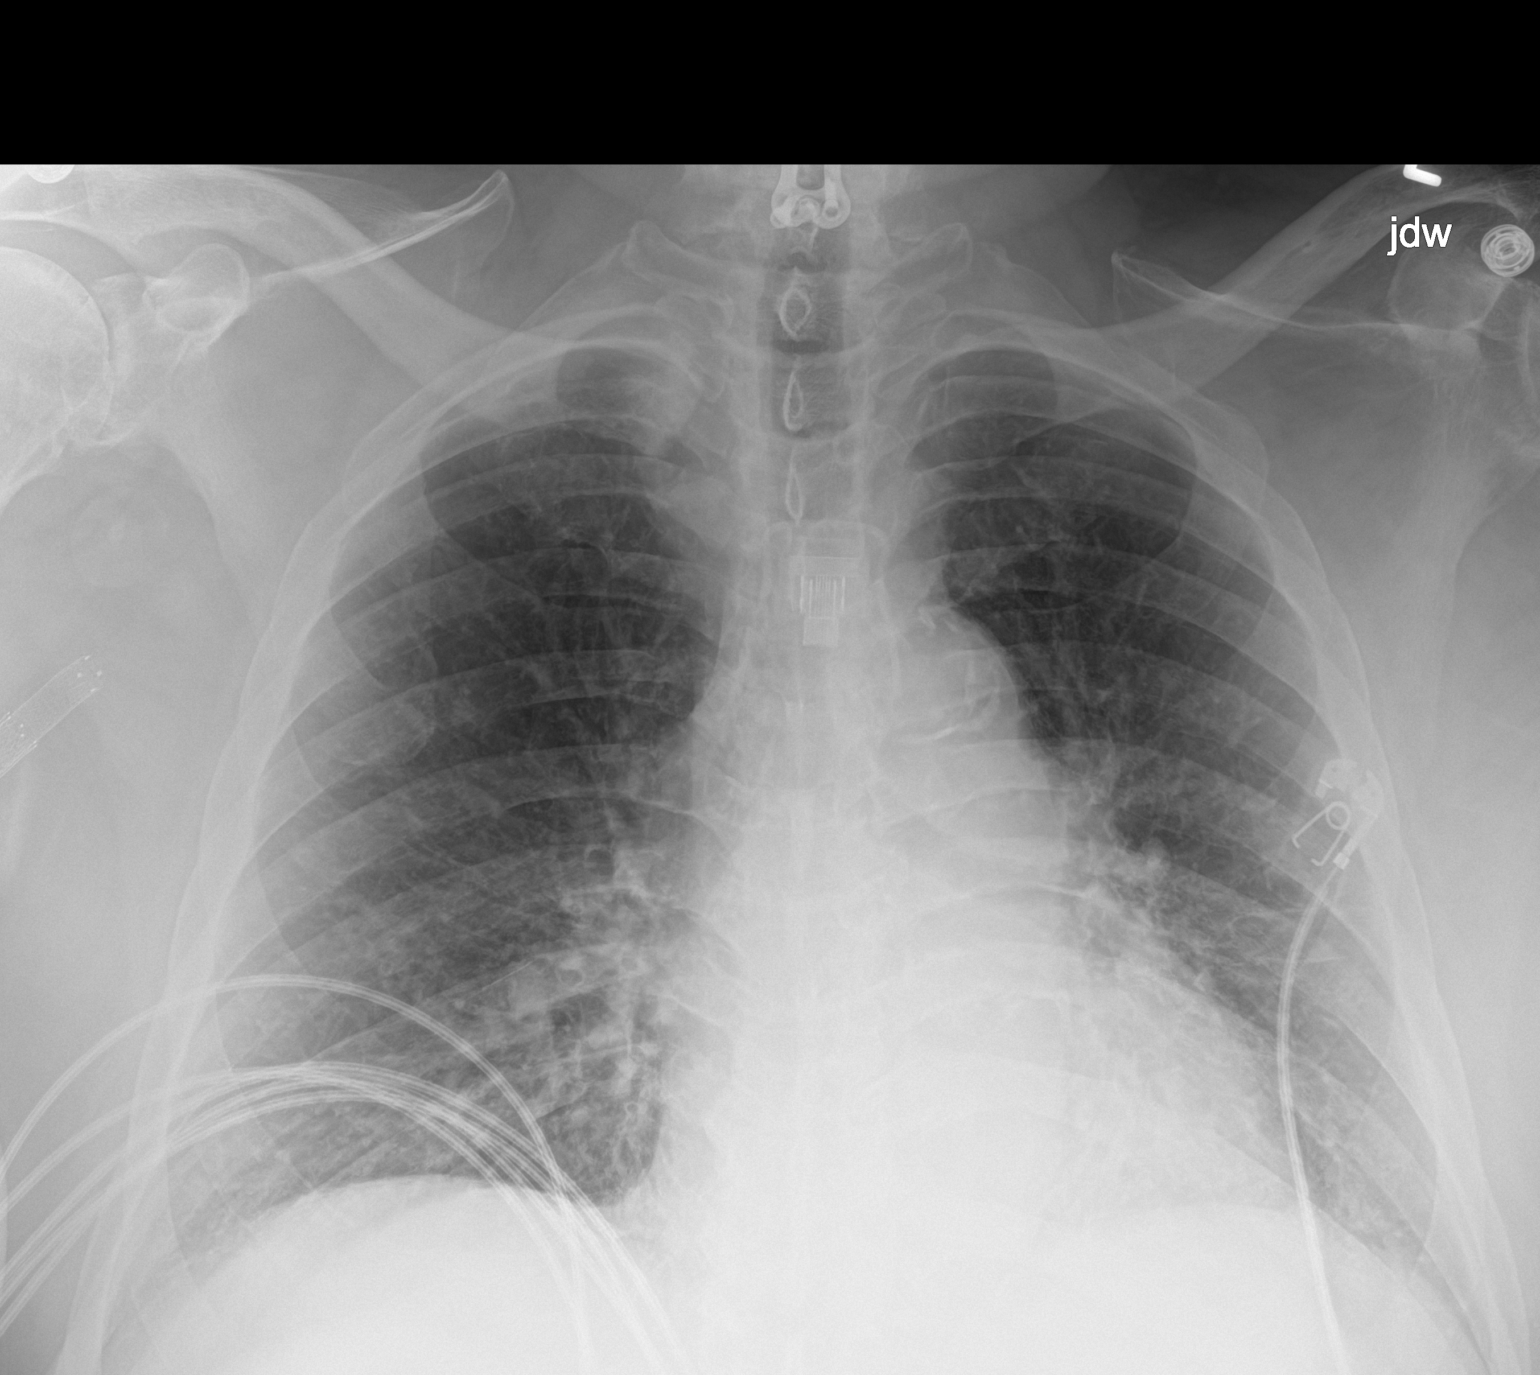

[1 of 1 positions shown; findings below may reference images not displayed]

FINDINGS: The heart size and mediastinal contours are stable. The aorta is
tortuous. There is mild central pulmonary vascular congestion. There
is no frank pulmonary edema, focal pneumonia or pleural effusion.
The visualized skeletal structures are stable.
IMPRESSION: Mild central pulmonary vascular congestion.

## 2019-09-02 IMAGING — CT CT ABD-PELV W/ CM
2 of 5 series · 16 of 46 positions shown, 18 images · IV contrast (omnipaque)
Comparison: October 04, 2018

CLINICAL DATA: Generalized abdomen pain with fever

EXAM:
CT ABDOMEN AND PELVIS WITH CONTRAST
TECHNIQUE: Multidetector CT imaging of the abdomen and pelvis was performed
using the standard protocol following bolus administration of
intravenous contrast.
CONTRAST:  100mL OMNIPAQUE IOHEXOL 300 MG/ML  SOLN

[Series 3: abd/ pelvis 5.0 i30f 2 · axial · 0.88mm/px · z∈[+748,+1183]mm · 13 of 97 slices shown, 15 images]
[im 5/97  soft-tissue]
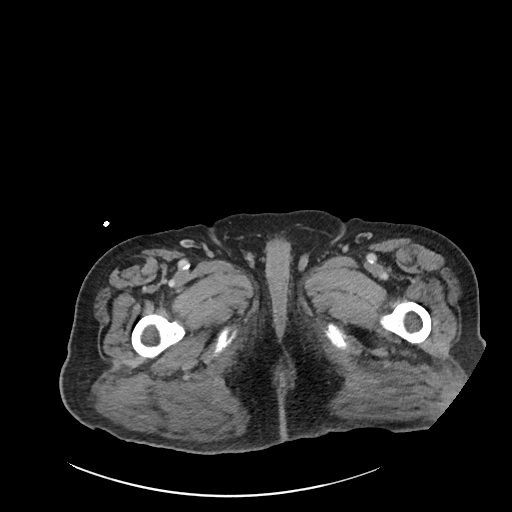
[im 5/97  bone]
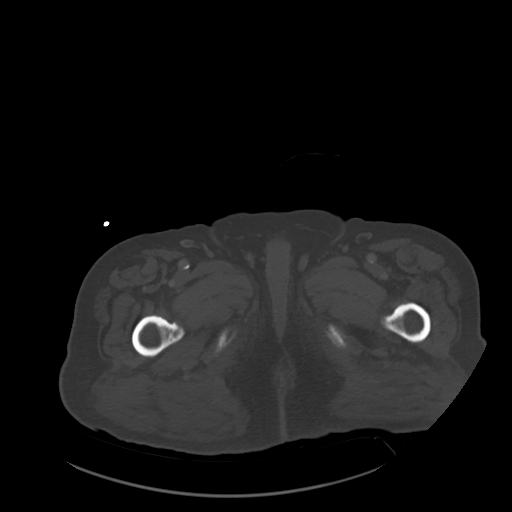
[im 14/97  soft-tissue]
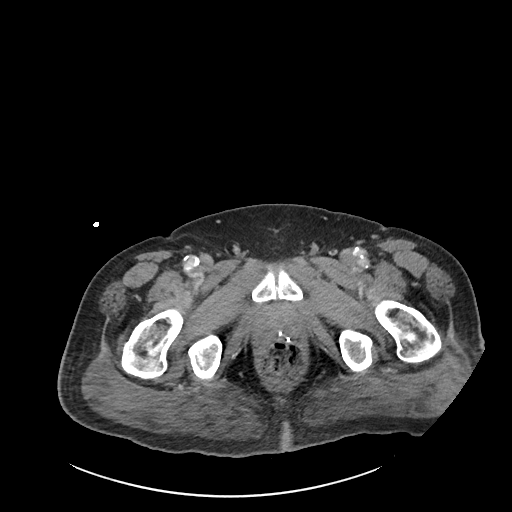
[im 19/97  soft-tissue]
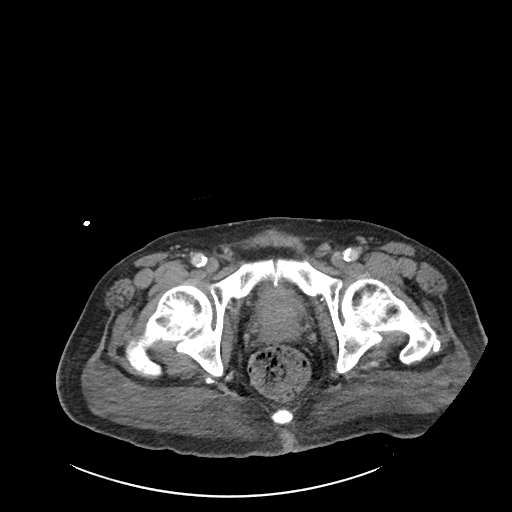
[im 28/97  soft-tissue]
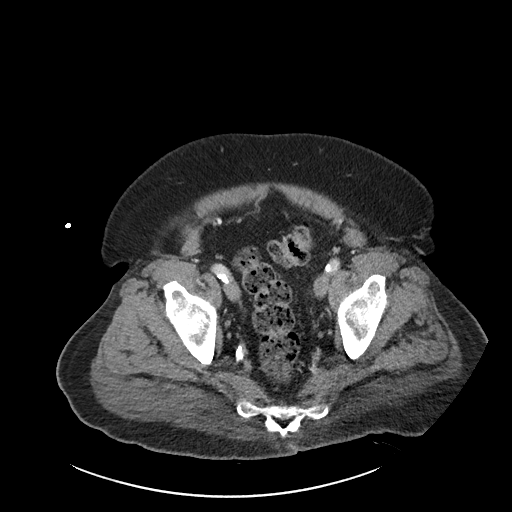
[im 33/97  soft-tissue]
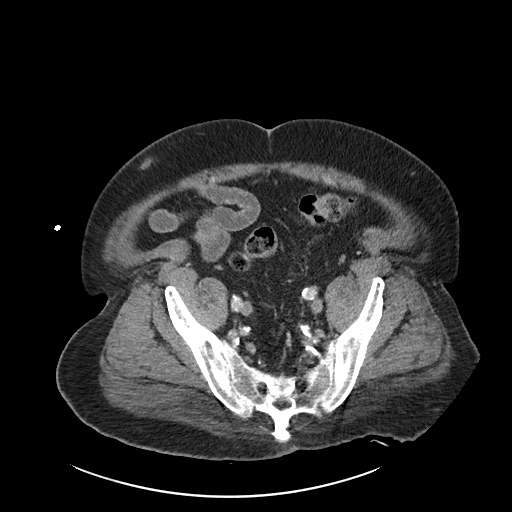
[im 42/97  soft-tissue]
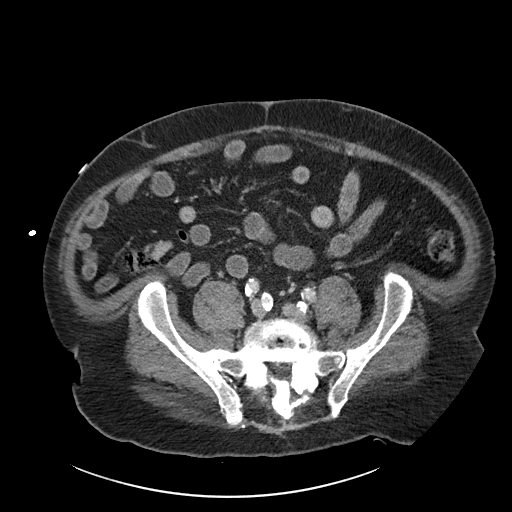
[im 51/97  soft-tissue]
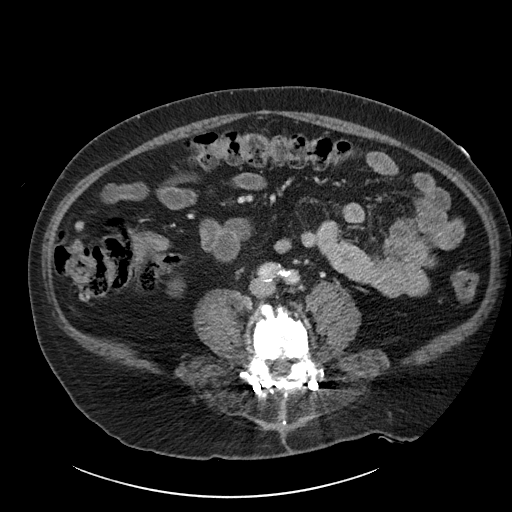
[im 55/97  soft-tissue]
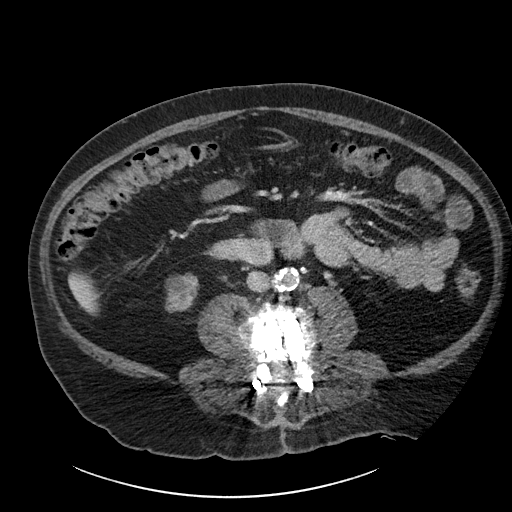
[im 65/97  soft-tissue]
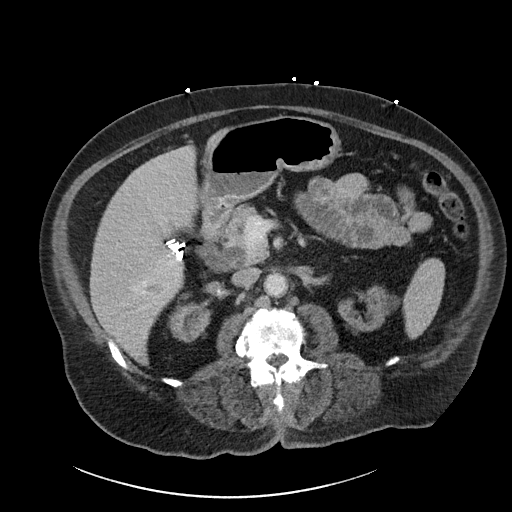
[im 65/97  bone]
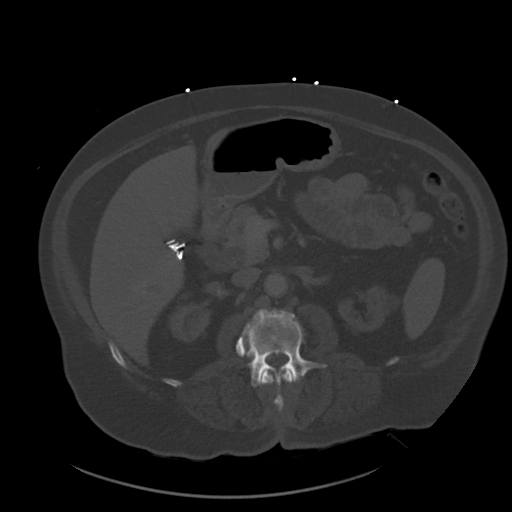
[im 69/97  soft-tissue]
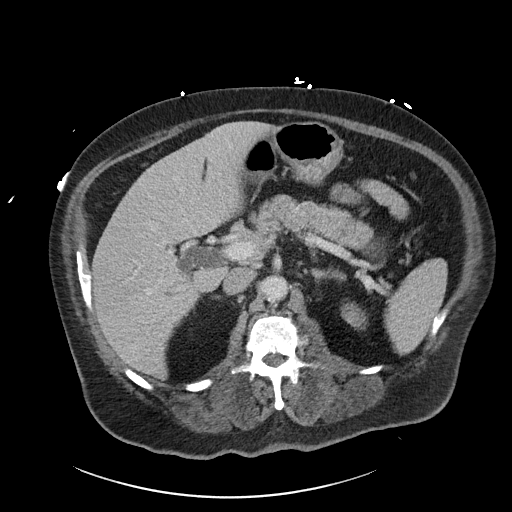
[im 78/97  soft-tissue]
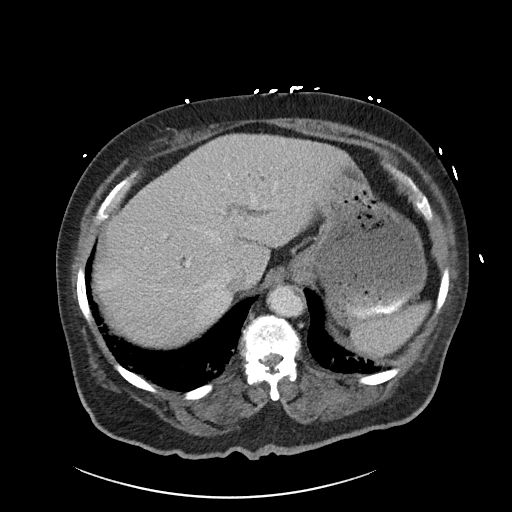
[im 83/97  soft-tissue]
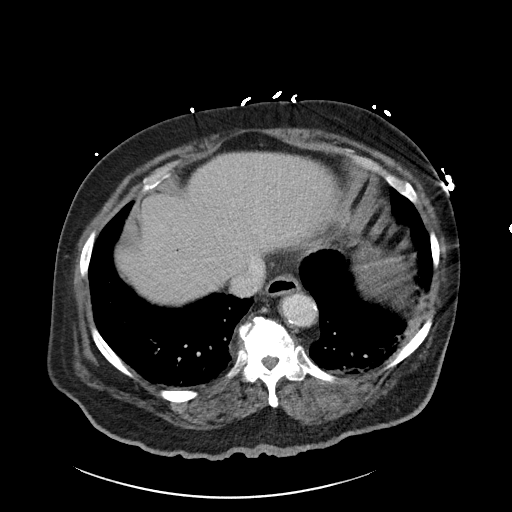
[im 92/97  soft-tissue]
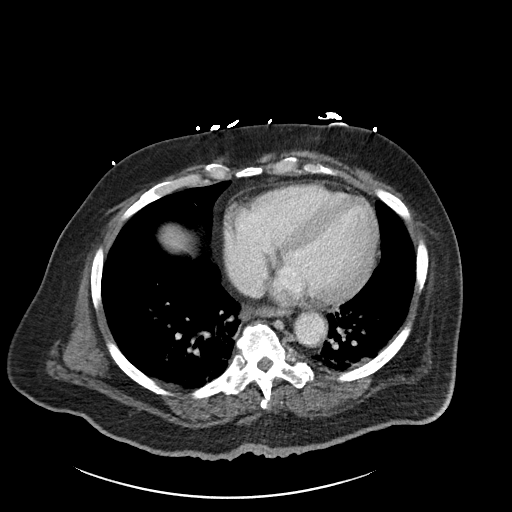

[Series 6: coronal soft tissue · coronal · 0.85mm/px · 3 of 111 slices shown]
[im 37/111  soft-tissue]
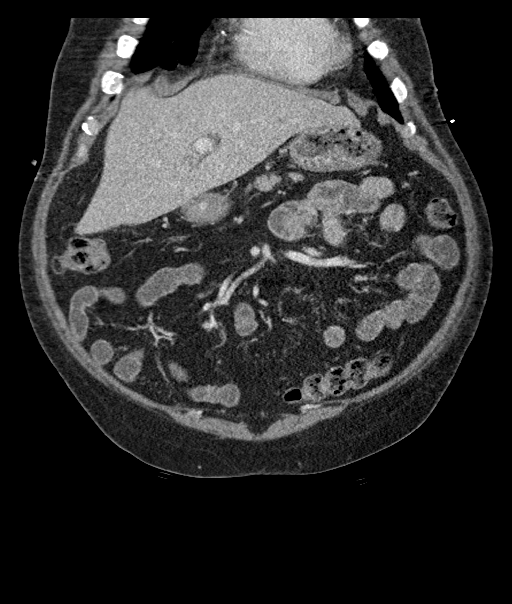
[im 49/111  soft-tissue]
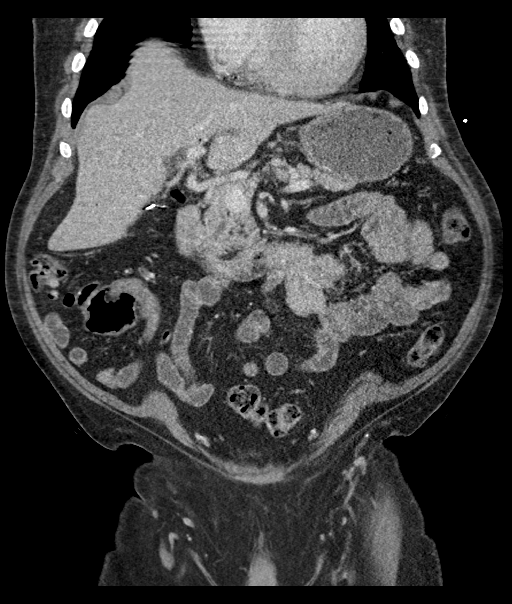
[im 62/111  soft-tissue]
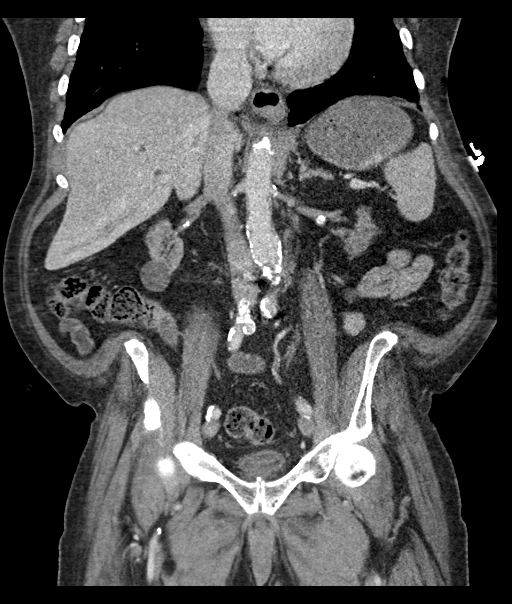

[16 of 46 positions shown; findings below may reference images not displayed]

FINDINGS: Lower chest: The heart size is enlarged. Mild atelectasis of
posterior lung bases are noted. Right lung base nodule 4 mm
unchanged.

Hepatobiliary: Mild diffuse low density of liver is identified.
Patient status post prior cholecystectomy with intrahepatic biliary
ductal dilatation and extrahepatic biliary ductal dilatation
identified. Pneumobilia is noted.

Pancreas: Unremarkable. No pancreatic ductal dilatation or
surrounding inflammatory changes.

Spleen: Normal in size without focal abnormality.

Adrenals/Urinary Tract: The adrenal glands are normal. Bilateral
kidney cysts are identified in atrophic kidneys bilaterally. Renal
vascular calcifications are identified bilaterally. There is no
hydronephrosis bilaterally. The bladder is decompressed limiting
evaluation.

Stomach/Bowel: Stomach is within normal limits. Appendix appears
normal. No evidence of bowel wall thickening, distention, or
inflammatory changes.

Vascular/Lymphatic: Aortic atherosclerosis. No enlarged abdominal or
pelvic lymph nodes.

Reproductive: Surgical clips are identified in the prostate gland
unchanged.

Other: None.

Musculoskeletal: Degenerative joint changes of the spine are noted.
Patient status post prior posterior fusion of lower lumbar spine.
IMPRESSION: No acute abnormality identified in the abdomen and pelvis.

Fatty infiltration of liver.

Status post prior cholecystectomy with intra and extrahepatic
biliary ductal dilatation, postsurgical.

Atrophic bilateral kidneys.

## 2019-09-03 ENCOUNTER — Other Ambulatory Visit (HOSPITAL_COMMUNITY)
Admission: RE | Admit: 2019-09-03 | Discharge: 2019-09-03 | Disposition: A | Discharge status: Home / Self Care | Payer: Medicare Other | Source: Ambulatory Visit | Attending: Gastroenterology | Admitting: Gastroenterology

## 2019-09-03 DIAGNOSIS — Z01812 Encounter for preprocedural laboratory examination: Secondary | ICD-10-CM | POA: Diagnosis not present

## 2019-09-03 DIAGNOSIS — Z20828 Contact with and (suspected) exposure to other viral communicable diseases: Secondary | ICD-10-CM | POA: Insufficient documentation

## 2019-09-04 ENCOUNTER — Ambulatory Visit: Payer: Medicare Other | Admitting: Vascular Surgery

## 2019-09-05 IMAGING — RF DG ERCP WO/W SPHINCTEROTOMY
1 series · 4 of 4 positions shown · non-contrast
Comparison: MRCP 11/13/2018

CLINICAL DATA: 67-year-old male with choledocholithiasis

EXAM:
ERCP
TECHNIQUE: Multiple spot images obtained with the fluoroscopic device and
submitted for interpretation post-procedure.
FLUOROSCOPY TIME:  Fluoroscopy Time:  5 minutes 28 seconds reported

[Series 1: unknown protocol · 0.20mm/px · 4 of 4 slices shown]
[im 1/4]
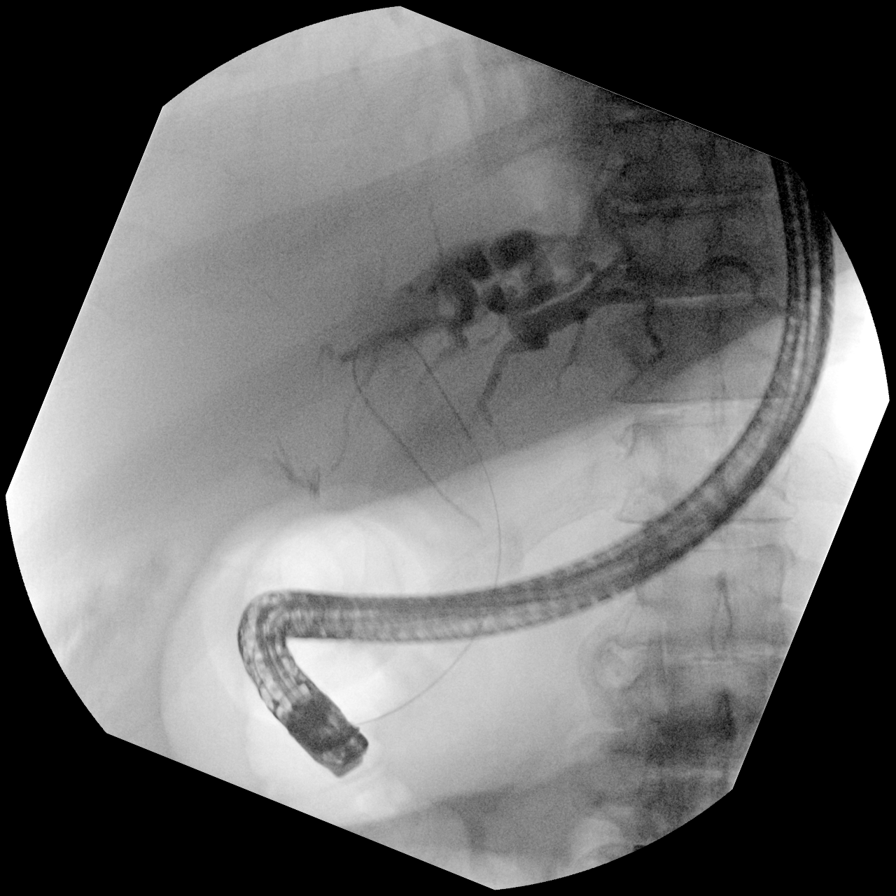
[im 2/4]
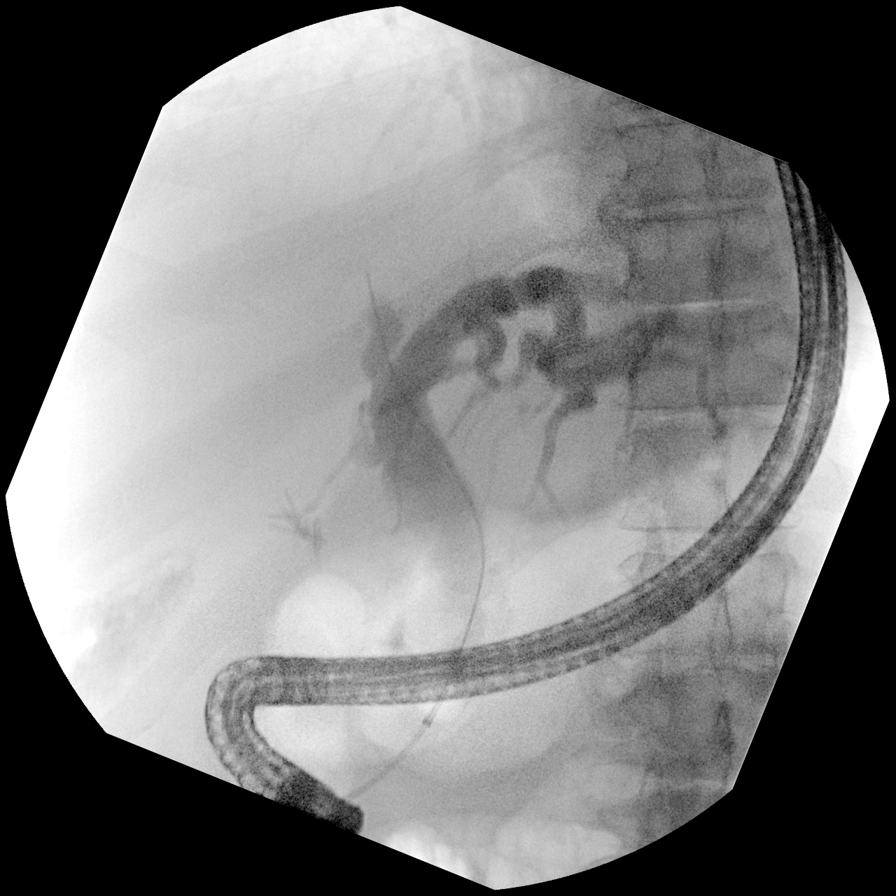
[im 3/4]
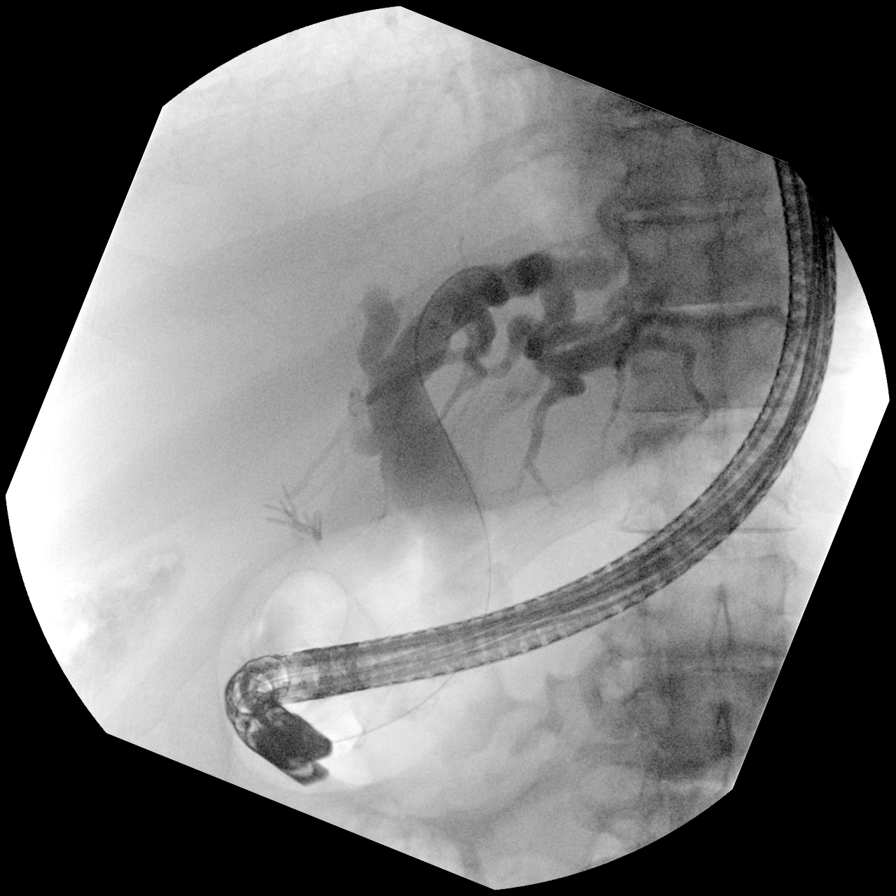
[im 4/4]
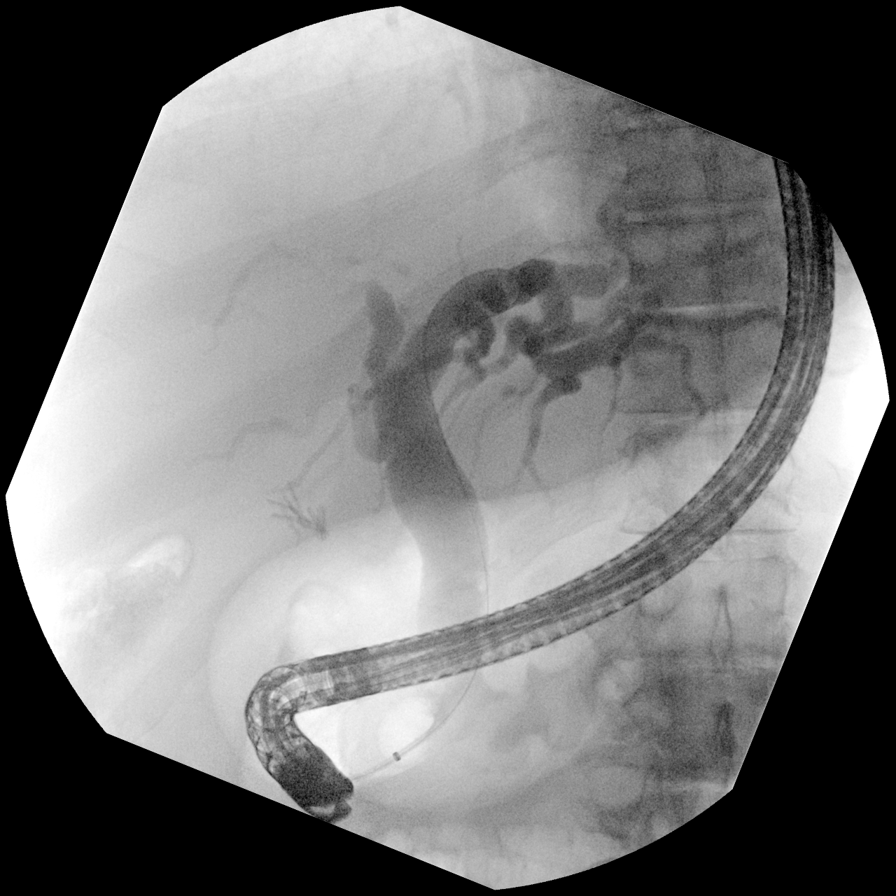

[4 of 4 positions shown; findings below may reference images not displayed]

FINDINGS: A total of 4 intraoperative spot images are submitted for review.
The images demonstrate a flexible endoscope in the descending
duodenum with wire cannulation of the common bile duct. Subsequent
cholangiography demonstrates intra and extrahepatic biliary ductal
dilatation. Subsequent images show sphincterotomy and balloon sweep
of the common duct.
IMPRESSION: ERCP as above.

These images were submitted for radiologic interpretation only.
Please see the procedural report for the amount of contrast and the
fluoroscopy time utilized.

## 2019-09-05 IMAGING — XA IR AV DIALY SHUNT INTRO NEEDLE/INTRACATH INITIAL W/PTA/IMG*R*
1 series · 14 of 24 positions shown · IV contrast (IODINE)
Comparison: none

INDICATION: 67-year-old male with a history of poor flow of right upper
extremity fistula during dialysis

[Series 300: dsa body · 14 of 88 slices shown]
[im 1/88]
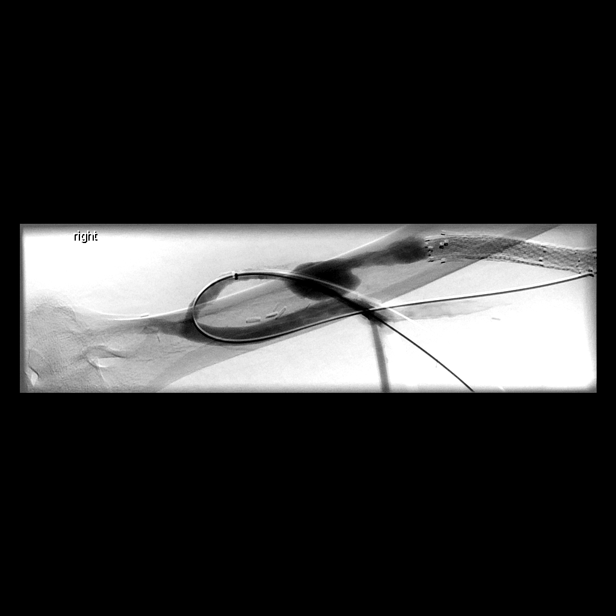
[im 8/88]
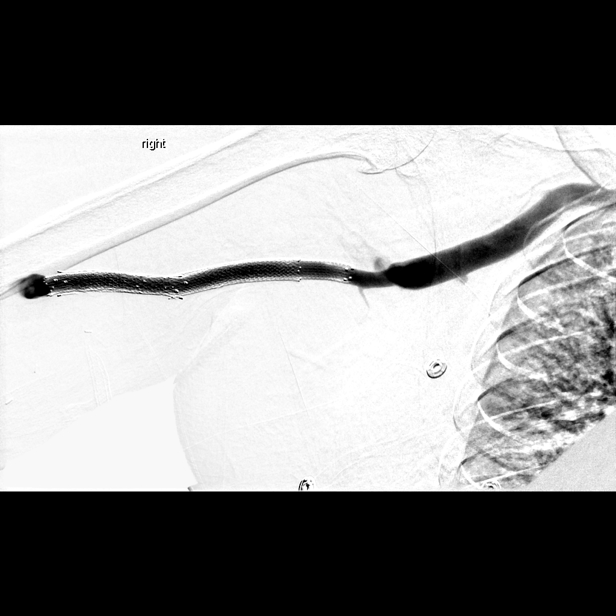
[im 16/88]
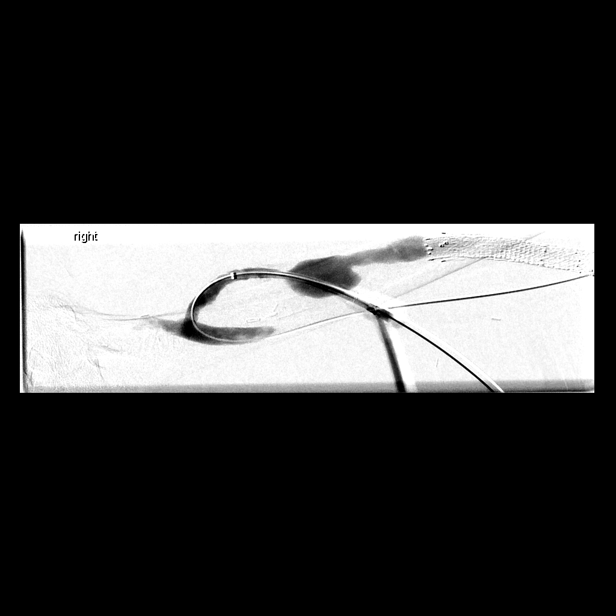
[im 23/88]
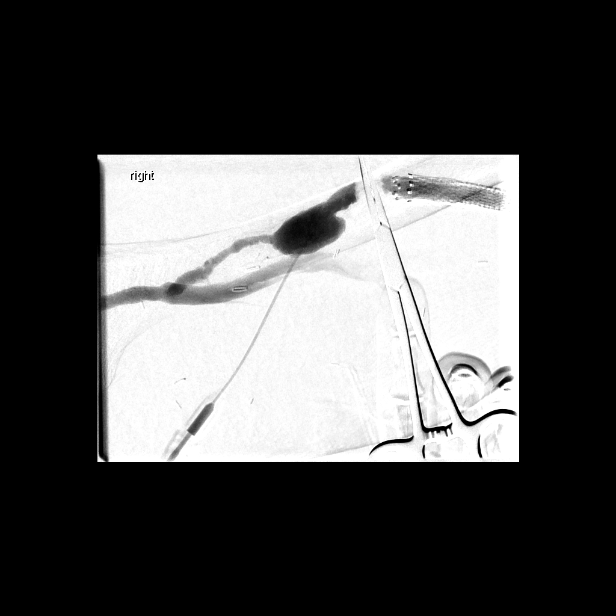
[im 27/88]
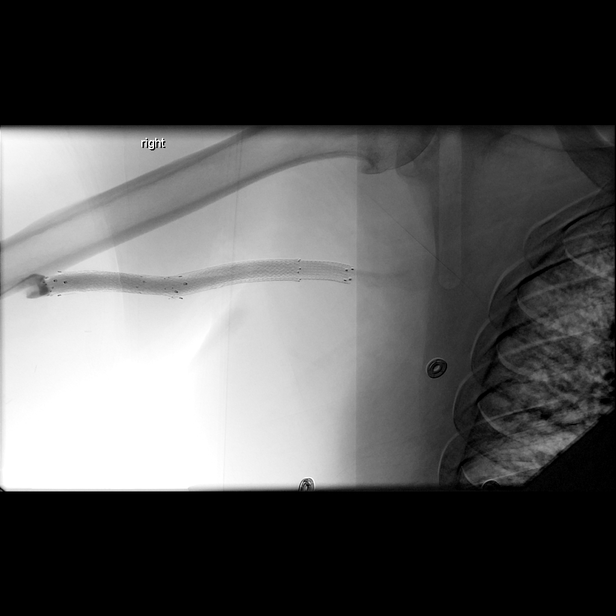
[im 35/88]
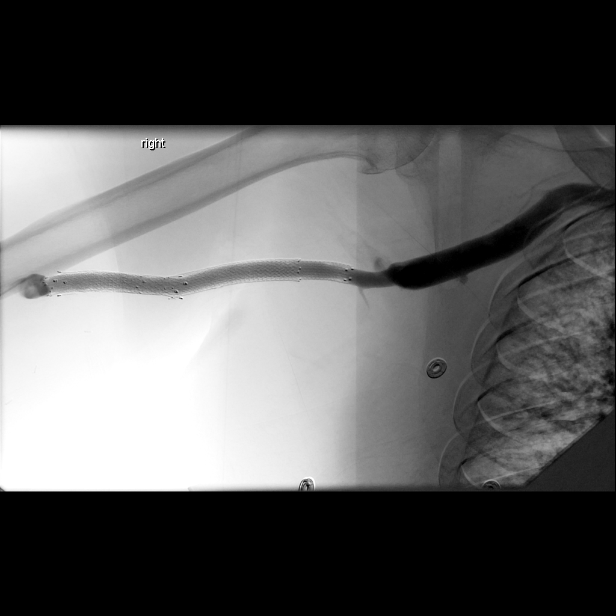
[im 42/88]
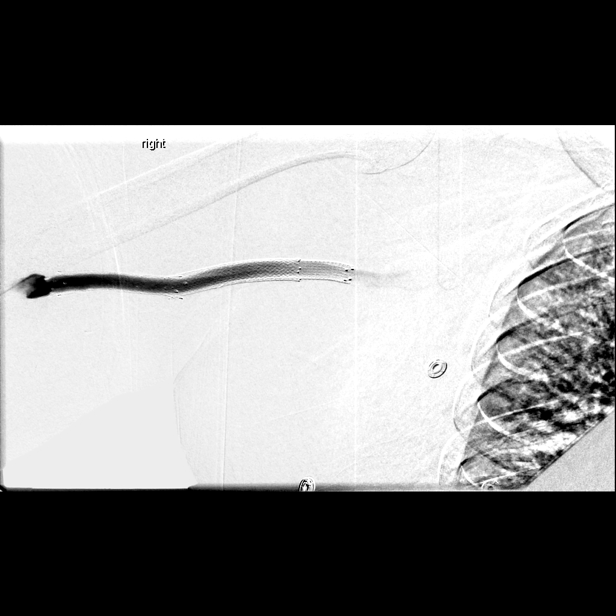
[im 46/88]
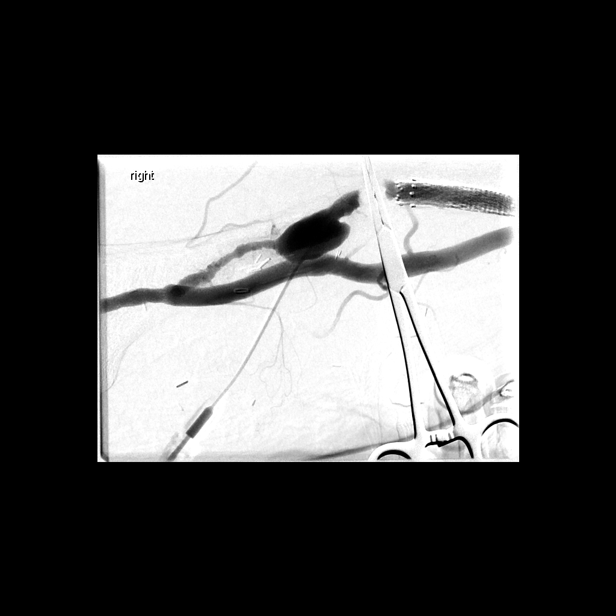
[im 53/88]
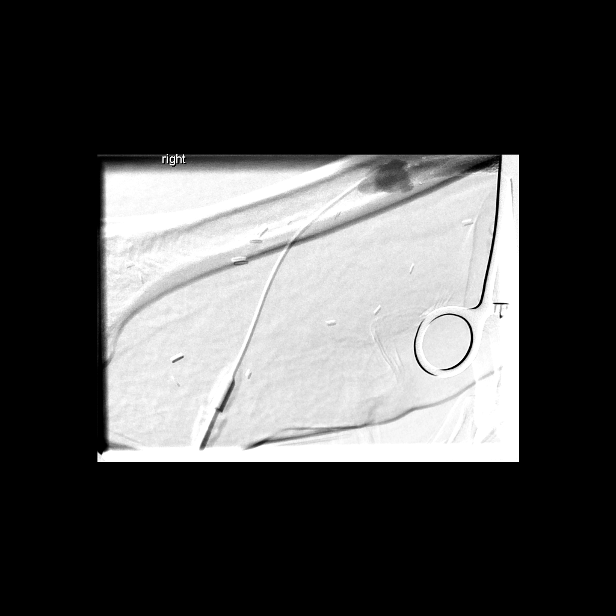
[im 61/88]
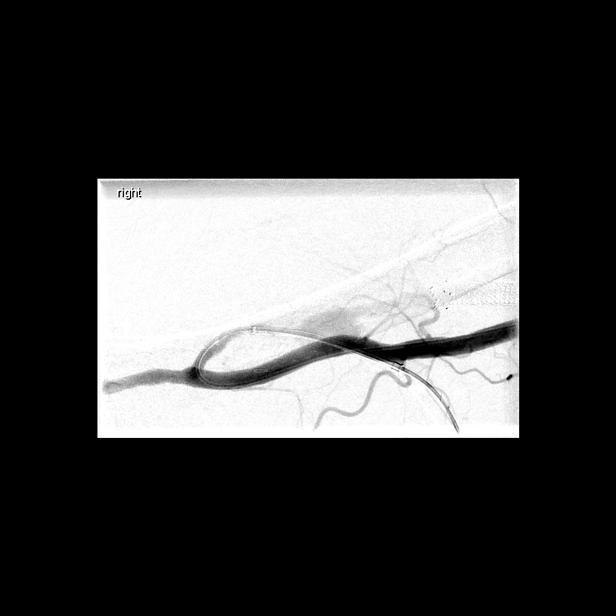
[im 69/88]
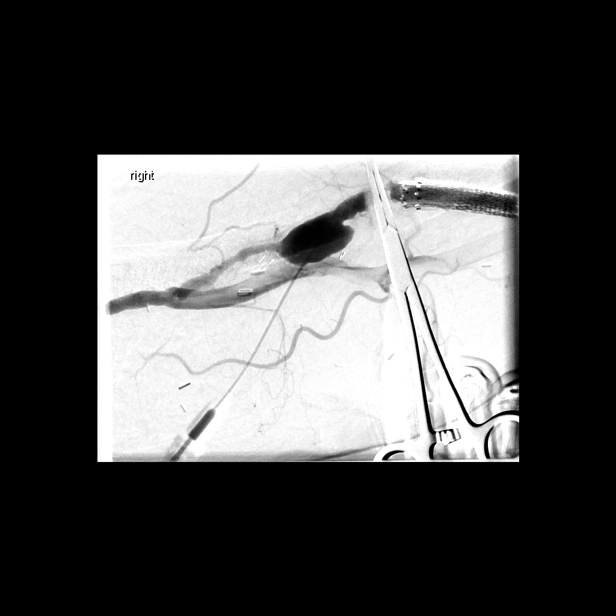
[im 72/88]
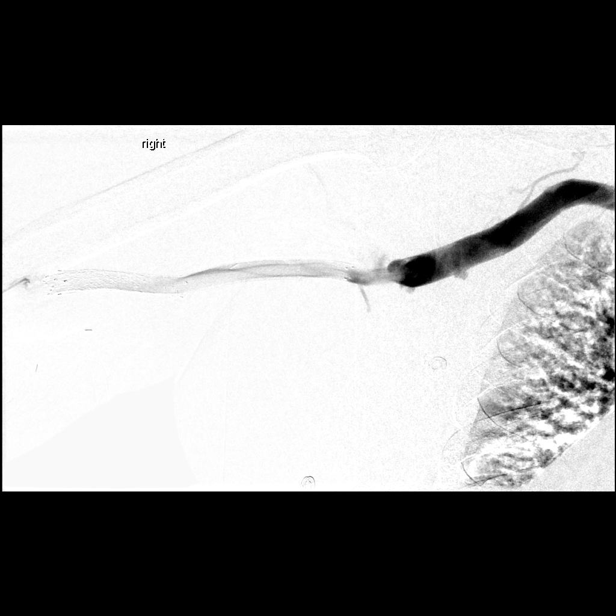
[im 80/88]
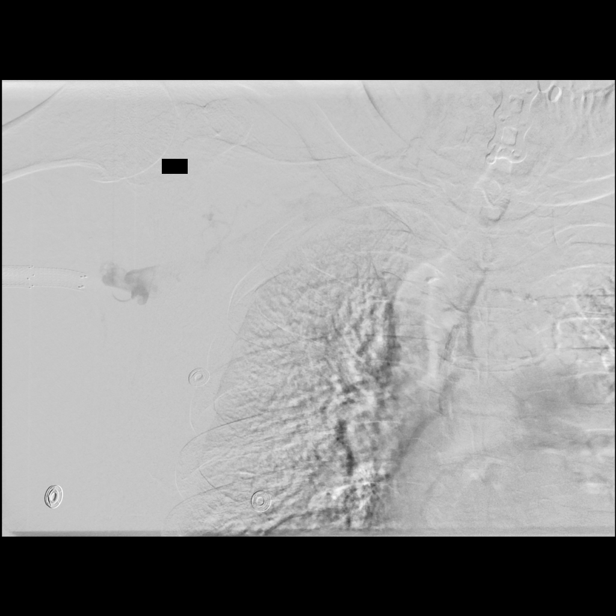
[im 88/88]
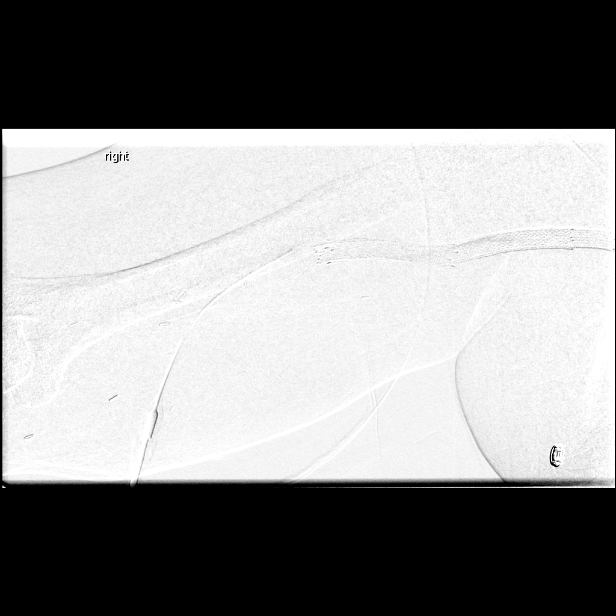

[14 of 24 positions shown; findings below may reference images not displayed]

EXAM:
ULTRASOUND-GUIDED ACCESS RIGHT UPPER EXTREMITY FISTULA

ANGIOPLASTY VENOUS OUTFLOW STENOSIS

MEDICATIONS:
1.5 mg Versed, 75 mcg fentanyl

ANESTHESIA/SEDATION:
Moderate Sedation Time:  29 minutes

The patient was continuously monitored during the procedure by the
interventional radiology nurse under my direct supervision.

FLUOROSCOPY TIME:  Fluoroscopy Time: 3 minutes 54 seconds (32 mGy).

COMPLICATIONS:
None

PROCEDURE:
Informed written consent was obtained from the patient after a
thorough discussion of the procedural risks, benefits and
alternatives. All questions were addressed. Maximal Sterile Barrier
Technique was utilized including caps, mask, sterile gowns, sterile
gloves, sterile drape, hand hygiene and skin antiseptic. A timeout
was performed prior to the initiation of the procedure.

Patient's right arm was prepped and draped in the usual sterile
fashion, 90 degree abduction. Ultrasound images were stored sent to
PACs. 1% lidocaine was used for local anesthesia. Ultrasound
guidance was used to puncture the fistula directed centrally. With
the micro puncture in place, images were performed of the upper
extremity including reflux images to the arteriovenous connection.

Given that the narrowing was identified just beyond the AV
connection, the access was reversed towards the AV connection. This
was done using fluoroscopy and Glidewire.

Six French sheath was then placed over the Glidewire.

Balloon angioplasty was then performed with 4 mm x 40 mm balloon and
then 5 mm x 40 mm balloon.

Less than 30% residual was identified at the completion.

Repeat images were performed.

Excellent thrill was confirmed.

All wires catheters balloons and the sheath were removed placing a
stay suture.

Patient tolerated the procedure well and remained hemodynamically
stable throughout.

No complications were encountered and no significant blood loss.
IMPRESSION: Status post ultrasound-guided access of the right upper extremity
fistula for fistulagram and balloon angioplasty of proximal venous
outflow stenosis, with less than 30% residual at the completion.

Excellent thrill confirmed upon completion.

ACCESS:
This access remains amenable to future percutaneous interventions as
clinically indicated.

## 2019-09-06 LAB — NOVEL CORONAVIRUS, NAA (HOSP ORDER, SEND-OUT TO REF LAB; TAT 18-24 HRS): SARS-CoV-2, NAA: NOT DETECTED

## 2019-09-07 ENCOUNTER — Ambulatory Visit (HOSPITAL_COMMUNITY): Payer: Medicare Other | Admitting: Anesthesiology

## 2019-09-07 ENCOUNTER — Other Ambulatory Visit: Payer: Self-pay

## 2019-09-07 ENCOUNTER — Encounter (HOSPITAL_COMMUNITY): Payer: Self-pay | Admitting: *Deleted

## 2019-09-07 ENCOUNTER — Ambulatory Visit (HOSPITAL_COMMUNITY)
Admission: RE | Admit: 2019-09-07 | Discharge: 2019-09-07 | Disposition: A | Discharge status: Home / Self Care | Payer: Medicare Other | Source: Ambulatory Visit | Attending: Gastroenterology | Admitting: Gastroenterology

## 2019-09-07 ENCOUNTER — Ambulatory Visit (HOSPITAL_COMMUNITY): Payer: Medicare Other

## 2019-09-07 ENCOUNTER — Encounter (HOSPITAL_COMMUNITY)
Admission: RE | Disposition: A | Discharge status: Home / Self Care | Payer: Self-pay | Source: Ambulatory Visit | Attending: Gastroenterology

## 2019-09-07 DIAGNOSIS — Z955 Presence of coronary angioplasty implant and graft: Secondary | ICD-10-CM | POA: Diagnosis not present

## 2019-09-07 DIAGNOSIS — Z8673 Personal history of transient ischemic attack (TIA), and cerebral infarction without residual deficits: Secondary | ICD-10-CM | POA: Diagnosis not present

## 2019-09-07 DIAGNOSIS — K831 Obstruction of bile duct: Secondary | ICD-10-CM | POA: Diagnosis not present

## 2019-09-07 DIAGNOSIS — Z992 Dependence on renal dialysis: Secondary | ICD-10-CM | POA: Insufficient documentation

## 2019-09-07 DIAGNOSIS — F329 Major depressive disorder, single episode, unspecified: Secondary | ICD-10-CM | POA: Insufficient documentation

## 2019-09-07 DIAGNOSIS — J449 Chronic obstructive pulmonary disease, unspecified: Secondary | ICD-10-CM | POA: Diagnosis not present

## 2019-09-07 DIAGNOSIS — N186 End stage renal disease: Secondary | ICD-10-CM | POA: Insufficient documentation

## 2019-09-07 DIAGNOSIS — E0590 Thyrotoxicosis, unspecified without thyrotoxic crisis or storm: Secondary | ICD-10-CM | POA: Diagnosis not present

## 2019-09-07 DIAGNOSIS — K8050 Calculus of bile duct without cholangitis or cholecystitis without obstruction: Secondary | ICD-10-CM | POA: Diagnosis not present

## 2019-09-07 DIAGNOSIS — E1122 Type 2 diabetes mellitus with diabetic chronic kidney disease: Secondary | ICD-10-CM | POA: Diagnosis not present

## 2019-09-07 DIAGNOSIS — I739 Peripheral vascular disease, unspecified: Secondary | ICD-10-CM | POA: Insufficient documentation

## 2019-09-07 DIAGNOSIS — I509 Heart failure, unspecified: Secondary | ICD-10-CM | POA: Diagnosis not present

## 2019-09-07 DIAGNOSIS — Z8546 Personal history of malignant neoplasm of prostate: Secondary | ICD-10-CM | POA: Diagnosis not present

## 2019-09-07 DIAGNOSIS — K219 Gastro-esophageal reflux disease without esophagitis: Secondary | ICD-10-CM | POA: Diagnosis not present

## 2019-09-07 DIAGNOSIS — G4730 Sleep apnea, unspecified: Secondary | ICD-10-CM | POA: Insufficient documentation

## 2019-09-07 DIAGNOSIS — I2510 Atherosclerotic heart disease of native coronary artery without angina pectoris: Secondary | ICD-10-CM | POA: Diagnosis not present

## 2019-09-07 DIAGNOSIS — Z87891 Personal history of nicotine dependence: Secondary | ICD-10-CM | POA: Diagnosis not present

## 2019-09-07 DIAGNOSIS — Z79899 Other long term (current) drug therapy: Secondary | ICD-10-CM | POA: Diagnosis not present

## 2019-09-07 DIAGNOSIS — Z4659 Encounter for fitting and adjustment of other gastrointestinal appliance and device: Secondary | ICD-10-CM | POA: Diagnosis not present

## 2019-09-07 DIAGNOSIS — I252 Old myocardial infarction: Secondary | ICD-10-CM | POA: Diagnosis not present

## 2019-09-07 DIAGNOSIS — Z7982 Long term (current) use of aspirin: Secondary | ICD-10-CM | POA: Diagnosis not present

## 2019-09-07 DIAGNOSIS — I132 Hypertensive heart and chronic kidney disease with heart failure and with stage 5 chronic kidney disease, or end stage renal disease: Secondary | ICD-10-CM | POA: Insufficient documentation

## 2019-09-07 DIAGNOSIS — F0390 Unspecified dementia without behavioral disturbance: Secondary | ICD-10-CM | POA: Diagnosis not present

## 2019-09-07 DIAGNOSIS — I5042 Chronic combined systolic (congestive) and diastolic (congestive) heart failure: Secondary | ICD-10-CM | POA: Diagnosis not present

## 2019-09-07 HISTORY — PX: STENT REMOVAL: SHX6421

## 2019-09-07 HISTORY — PX: SPHINCTEROTOMY: SHX5544

## 2019-09-07 HISTORY — PX: REMOVAL OF STONES: SHX5545

## 2019-09-07 HISTORY — PX: ERCP: SHX5425

## 2019-09-07 LAB — POCT I-STAT, CHEM 8
BUN: 71 mg/dL — ABNORMAL HIGH (ref 8–23)
Calcium, Ion: 0.84 mmol/L — CL (ref 1.15–1.40)
Chloride: 105 mmol/L (ref 98–111)
Creatinine, Ser: 10.5 mg/dL — ABNORMAL HIGH (ref 0.61–1.24)
Glucose, Bld: 148 mg/dL — ABNORMAL HIGH (ref 70–99)
HCT: 40 % (ref 39.0–52.0)
Hemoglobin: 13.6 g/dL (ref 13.0–17.0)
Potassium: 5.2 mmol/L — ABNORMAL HIGH (ref 3.5–5.1)
Sodium: 140 mmol/L (ref 135–145)
TCO2: 30 mmol/L (ref 22–32)

## 2019-09-07 SURGERY — ERCP, WITH INTERVENTION IF INDICATED
Anesthesia: General

## 2019-09-07 MED ORDER — LIDOCAINE 2% (20 MG/ML) 5 ML SYRINGE
INTRAMUSCULAR | Status: DC | PRN
  Administered 2019-09-07: 60 mg via INTRAVENOUS

## 2019-09-07 MED ORDER — GLUCAGON HCL RDNA (DIAGNOSTIC) 1 MG IJ SOLR
INTRAMUSCULAR | Status: AC
  Filled 2019-09-07: qty 1

## 2019-09-07 MED ORDER — ONDANSETRON HCL 4 MG/2ML IJ SOLN
INTRAMUSCULAR | Status: DC | PRN
  Administered 2019-09-07: 4 mg via INTRAVENOUS

## 2019-09-07 MED ORDER — SODIUM CHLORIDE 0.9 % IV SOLN: 1.0000 "application " | Freq: Once | INTRAVENOUS | Status: DC

## 2019-09-07 MED ORDER — SUCCINYLCHOLINE CHLORIDE 200 MG/10ML IV SOSY
PREFILLED_SYRINGE | INTRAVENOUS | Status: DC | PRN
  Administered 2019-09-07: 120 mg via INTRAVENOUS

## 2019-09-07 MED ORDER — PROPOFOL 10 MG/ML IV BOLUS
INTRAVENOUS | Status: DC | PRN
  Administered 2019-09-07: 110 mg via INTRAVENOUS

## 2019-09-07 MED ORDER — SODIUM CHLORIDE 0.9 % IV SOLN
INTRAVENOUS | Status: DC
  Administered 2019-09-07: 08:00:00 via INTRAVENOUS

## 2019-09-07 MED ORDER — SODIUM CHLORIDE 0.9 % IV SOLN
INTRAVENOUS | Status: DC | PRN
  Administered 2019-09-07: 40 mL

## 2019-09-07 MED ORDER — PROPOFOL 10 MG/ML IV BOLUS
INTRAVENOUS | Status: AC
  Filled 2019-09-07: qty 20

## 2019-09-07 MED ORDER — FENTANYL CITRATE (PF) 100 MCG/2ML IJ SOLN
INTRAMUSCULAR | Status: AC
  Filled 2019-09-07: qty 2

## 2019-09-07 MED ORDER — CEFAZOLIN SODIUM-DEXTROSE 1-4 GM/50ML-% IV SOLN
1.0000 g | INTRAVENOUS | Status: AC
  Administered 2019-09-07: 1 g via INTRAVENOUS
  Filled 2019-09-07: qty 50

## 2019-09-07 MED ORDER — FENTANYL CITRATE (PF) 100 MCG/2ML IJ SOLN
INTRAMUSCULAR | Status: DC | PRN
  Administered 2019-09-07: 50 ug via INTRAVENOUS

## 2019-09-07 MED ORDER — PHENYLEPHRINE HCL-NACL 10-0.9 MG/250ML-% IV SOLN
INTRAVENOUS | Status: DC | PRN
  Administered 2019-09-07: 50 ug/min via INTRAVENOUS

## 2019-09-07 MED ORDER — INDOMETHACIN 50 MG RE SUPP
RECTAL | Status: AC
  Filled 2019-09-07: qty 2

## 2019-09-07 NOTE — Op Note (Signed)
[facility] Patient Name: [name] Procedure Date: 09/07/2019 MRN: [id number] Attending MD: Clarene Essex , MD Date of Birth: [birth year] CSN: [id number] Age: 68 Admit Type: Outpatient Procedure:                ERCP Indications:              For therapy of bile duct stone(s) difficult to                            remove Providers:                Clarene Essex, MD, Cleda Daub, RN, Lina Sar,                            Technician, Danley Danker, CRNA Referring MD:              Medicines:                General Anesthesia Complications:            No immediate complications. Estimated Blood Loss:     Estimated blood loss: none. Procedure:                Pre-Anesthesia Assessment:                           - Prior to the procedure, a History and Physical                            was performed, and patient medications and                            allergies were reviewed. The patient's tolerance of                            previous anesthesia was also reviewed. The risks                            and benefits of the procedure and the sedation                            options and risks were discussed with the patient.                            All questions were answered, and informed consent                            was obtained. Prior Anticoagulants: The patient has                            taken no previous anticoagulant or antiplatelet                            agents except for aspirin. ASA Grade Assessment:                            III - A  patient with severe systemic disease. After                            reviewing the risks and benefits, the patient was                            deemed in satisfactory condition to undergo the                            procedure.                           After obtaining informed consent, the scope was                            passed under direct vision. Throughout the                            procedure,  the patient's blood pressure, pulse, and                            oxygen saturations were monitored continuously. The                            [id number]-Q180V ([id number]) Olympus duodenoscope was                            introduced through the mouth, and used to inject                            contrast into and used to cannulate the bile duct.                            The ERCP was accomplished without difficulty. The                            patient tolerated the procedure well. The patient                            did have some residual food in the stomach but the                            scope was still passed readily into the duodenum Scope In: Scope Out: Findings:      After stent removal as noted below deep selective cannulation was       readily obtained on the first attempt and obvious CBD stones were seen       and the biliary sphincterotomy was extended with a Hydratome       sphincterotome using ERBE electrocautery. There was no       post-sphincterotomy bleeding. The blood seen on the picture was from       stent removal and the common bile duct contained multiple stones, the       largest of which was large in diameter. Choledocholithiasis was found in  a dilated duct. The biliary tree was swept initially with a 15 mm       balloon and multiple stones and sludge were removed and we then switched       to a 18 mm balloon starting at the bifurcation. Sludge was swept from       the duct. All stones were removed. Nothing was found on occlusion       cholangiogram x2. Not mentioned above at the beginning of the procedure       one stent was removed from the common bile duct using a snare and it       seemed to be occluded filled with sludge. There was no pancreatic duct       injection or wire advancement and the 18 mm balloon passed readily       through the pain sphincterotomy site and there was adequate biliary       drainage Impression:               -  Choledocholithiasis was found. Complete removal                            was accomplished by biliary sphincterotomy and                            balloon extraction.                           - A biliary sphincterotomy was performed.                           - The biliary tree was swept and nothing was found                            at the end of the procedure.                           - One stent was removed from the common bile duct. Moderate Sedation:      Not Applicable - Patient had care per Anesthesia. Recommendation:           - Clear liquid diet today.                           - Continue present medications.                           - Return to GI clinic PRN.                           - Telephone GI clinic if symptomatic PRN. Procedure Code(s):        --- Professional ---                           [phone], Endoscopic retrograde                            cholangiopancreatography (ERCP); with removal of  calculi/debris from biliary/pancreatic duct(s)                           [phone], Endoscopic retrograde                            cholangiopancreatography (ERCP); with                            sphincterotomy/papillotomy Diagnosis Code(s):        --- Professional ---                           K80.50, Calculus of bile duct without cholangitis                            or cholecystitis without obstruction CPT copyright 2019 [employer]. All rights reserved. The codes documented in this report are preliminary and upon coder review may  be revised to meet current compliance requirements. Clarene Essex, MD 09/07/2019 9:28:11 AM This report has been signed electronically. Number of Addenda: 0

## 2019-09-07 NOTE — Discharge Instructions (Signed)
YOU HAD AN ENDOSCOPIC PROCEDURE TODAY: Refer to the procedure report and other information in the discharge instructions given to you for any specific questions about what was found during the examination. If this information does not answer your questions, please call [facility] office at [phone] to clarify.   YOU SHOULD EXPECT: Some feelings of bloating in the abdomen. Passage of more gas than usual. Walking can help get rid of the air that was put into your GI tract during the procedure and reduce the bloating. If you had a lower endoscopy (such as a colonoscopy or flexible sigmoidoscopy) you may notice spotting of blood in your stool or on the toilet paper. Some abdominal soreness may be present for a day or two, also.  DIET: Your first meal following the procedure should be a light meal and then it is ok to progress to your normal diet. A half-sandwich or bowl of soup is an example of a good first meal. Heavy or fried foods are harder to digest and may make you feel nauseous or bloated. Drink plenty of fluids but you should avoid alcoholic beverages for 24 hours. If you had a esophageal dilation, please see attached instructions for diet.   ACTIVITY: Your care partner should take you home directly after the procedure. You should plan to take it easy, moving slowly for the rest of the day. You can resume normal activity the day after the procedure however YOU SHOULD NOT DRIVE, use power tools, machinery or perform tasks that involve climbing or major physical exertion for 24 hours (because of the sedation medicines used during the test).   SYMPTOMS TO REPORT IMMEDIATELY: A gastroenterologist can be reached at any hour. Please call [phone]  for any of the following symptoms:   Following upper endoscopy (EGD, EUS, ERCP, esophageal dilation) Vomiting of blood or coffee ground material  New, significant abdominal pain  New, significant chest pain or pain under the shoulder blades  Painful or  persistently difficult swallowing  New shortness of breath  Black, tarry-looking or red, bloody stools  FOLLOW UP:  If any biopsies were taken you will be contacted by phone or by letter within the next 1-3 weeks. Call [phone]  if you have not heard about the biopsies in 3 weeks.  Please also call with any specific questions about appointments or follow up tests. Begin with clear liquids for 6 hours and then if doing well may have soft solids the remainder of the day and call if question or problem and follow-up as needed for GI issues and watch for fever yellow jaundice recurrent upper abdominal pain or signs of GI bleeding

## 2019-09-07 NOTE — Anesthesia Postprocedure Evaluation (Signed)
Anesthesia Post Note  Patient: [name]  Procedure(s) Performed: ENDOSCOPIC RETROGRADE CHOLANGIOPANCREATOGRAPHY (ERCP) (N/A ) STENT REMOVAL SPHINCTEROTOMY REMOVAL OF STONES     Patient location during evaluation: PACU Anesthesia Type: General Level of consciousness: awake and alert Pain management: pain level controlled Vital Signs Assessment: post-procedure vital signs reviewed and stable Respiratory status: spontaneous breathing, nonlabored ventilation, respiratory function stable and patient connected to nasal cannula oxygen Cardiovascular status: blood pressure returned to baseline and stable Postop Assessment: no apparent nausea or vomiting Anesthetic complications: no    Last Vitals:  Vitals:   09/07/19 0936 09/07/19 0940  BP: (!) 163/76 (!) 148/82  Pulse: 80 81  Resp: 10 13  Temp:    SpO2: 95% 95%    Last Pain:  Vitals:   09/07/19 0940  TempSrc:   PainSc: 0-No pain                 Effie Berkshire

## 2019-09-07 NOTE — Anesthesia Preprocedure Evaluation (Addendum)
Anesthesia Evaluation  Patient identified by MRN, date of birth, ID band Patient awake    Reviewed: Allergy & Precautions, NPO status , Patient's Chart, lab work & pertinent test results, reviewed documented beta blocker date and time   History of Anesthesia Complications Negative for: history of anesthetic complications  Airway Mallampati: I  TM Distance: >3 FB Neck ROM: Full    Dental  (+) Dental Advisory Given, Missing, Poor Dentition   Pulmonary shortness of breath, sleep apnea and Continuous Positive Airway Pressure Ventilation , COPD, former smoker,  pulm nodule   breath sounds clear to auscultation       Cardiovascular hypertension, Pt. on medications and Pt. on home beta blockers + CAD, + Past MI, + Cardiac Stents, + Peripheral Vascular Disease and +CHF  + dysrhythmias (s/p ablation) Supra Ventricular Tachycardia  Rhythm:Regular Rate:Normal  1/18 ECHO: EF 40-45%, valves OK   Neuro/Psych  Headaches, PSYCHIATRIC DISORDERS Depression Dementia  Neuromuscular disease CVA    GI/Hepatic PUD, Bowel prep,GERD  Controlled and Medicated,(+) Hepatitis -Elevated LFTs   Endo/Other  diabetesHyperthyroidism   Renal/GU ESRF and DialysisRenal disease ([facility]Th[facility])   Prostate cancer    Musculoskeletal  (+) Arthritis ,   Abdominal (+) + obese,   Peds  Hematology Hb 8.5   Anesthesia Other Findings   Reproductive/Obstetrics                            Echo: 1. The left ventricle has normal systolic function, with an ejection fraction of 60-65%. The cavity size was normal. There is mildly increased left ventricular wall thickness. Left ventricular diastolic function could not be evaluated.  2. The right ventricle has normal systolc function. The cavity was normal. There is no increase in right ventricular wall thickness.  3. Mild thickening of the mitral valve leaflet.  4. The aortic valve is tricuspid Moderate  thickening of the aortic valve Sclerosis without any evidence of stenosis of the aortic valve.  5. AV sclerosis with no SBE/vegetations.  6. The aortic root is normal in size and structure.  7. There is evidence of moderate plaque in the descending aorta.  Anesthesia Physical  Anesthesia Plan  ASA: III  Anesthesia Plan: General   Post-op Pain Management:    Induction: Intravenous, Rapid sequence and Cricoid pressure planned  PONV Risk Score and Plan: 2 and Ondansetron and Treatment may vary due to age or medical condition  Airway Management Planned: Oral ETT  Additional Equipment: None  Intra-op Plan:   Post-operative Plan: Extubation in OR  Informed Consent: I have reviewed the patients History and Physical, chart, labs and discussed the procedure including the risks, benefits and alternatives for the proposed anesthesia with the patient or authorized representative who has indicated his/her understanding and acceptance.     Dental advisory given  Plan Discussed with: CRNA and Surgeon  Anesthesia Plan Comments:        Anesthesia Quick Evaluation

## 2019-09-07 NOTE — Progress Notes (Signed)
[name] 8:11 AM  Subjective: Patient with difficult to remove CBD stones and other than fatigue no new complaints and again we discussed the recurrent nature and difficulty of the procedure and answered all of his questions  Objective: Vital signs stable afebrile exam please see preassessment evaluation  Assessment: Difficult to remove CBD stones  Plan: Okay to proceed with repeat ERCP with anesthesia assistance  [facility]  office [phone] After 5PM or if no answer call [phone]

## 2019-09-07 NOTE — Transfer of Care (Signed)
Immediate Anesthesia Transfer of Care Note  Patient: [name]  Procedure(s) Performed: ENDOSCOPIC RETROGRADE CHOLANGIOPANCREATOGRAPHY (ERCP) (N/A ) STENT REMOVAL SPHINCTEROTOMY REMOVAL OF STONES  Patient Location: Endoscopy Unit  Anesthesia Type:General  Level of Consciousness: drowsy  Airway & Oxygen Therapy: Patient Spontanous Breathing and Patient connected to face mask oxygen  Post-op Assessment: Report given to RN and Post -op Vital signs reviewed and stable  Post vital signs: Reviewed and stable  Last Vitals:  Vitals Value Taken Time  BP    Temp    Pulse    Resp    SpO2      Last Pain:  Vitals:   09/07/19 0729  TempSrc: Oral  PainSc: 0-No pain         Complications: No apparent anesthesia complications

## 2019-09-07 NOTE — Anesthesia Procedure Notes (Signed)
Procedure Name: Intubation Date/Time: 09/07/2019 8:14 AM Performed by: Sharlette Dense, CRNA Patient Re-evaluated:Patient Re-evaluated prior to induction Oxygen Delivery Method: Circle system utilized Preoxygenation: Pre-oxygenation with 100% oxygen Induction Type: IV induction Ventilation: Mask ventilation without difficulty Laryngoscope Size: Miller and 3 Grade View: Grade I Tube type: Oral Tube size: 7.5 mm Number of attempts: 1 Airway Equipment and Method: Stylet Placement Confirmation: ETT inserted through vocal cords under direct vision,  positive ETCO2 and breath sounds checked- equal and bilateral Secured at: 23 cm Tube secured with: Tape Dental Injury: Teeth and Oropharynx as per pre-operative assessment

## 2019-09-11 ENCOUNTER — Encounter: Payer: Self-pay | Admitting: *Deleted

## 2019-09-11 DIAGNOSIS — E1151 Type 2 diabetes mellitus with diabetic peripheral angiopathy without gangrene: Secondary | ICD-10-CM | POA: Diagnosis not present

## 2019-09-11 DIAGNOSIS — E1122 Type 2 diabetes mellitus with diabetic chronic kidney disease: Secondary | ICD-10-CM | POA: Diagnosis not present

## 2019-09-11 DIAGNOSIS — I5082 Biventricular heart failure: Secondary | ICD-10-CM | POA: Diagnosis not present

## 2019-09-11 DIAGNOSIS — N186 End stage renal disease: Secondary | ICD-10-CM | POA: Diagnosis not present

## 2019-09-11 DIAGNOSIS — M5412 Radiculopathy, cervical region: Secondary | ICD-10-CM | POA: Diagnosis not present

## 2019-09-11 DIAGNOSIS — E1142 Type 2 diabetes mellitus with diabetic polyneuropathy: Secondary | ICD-10-CM | POA: Diagnosis not present

## 2019-09-11 DIAGNOSIS — K8309 Other cholangitis: Secondary | ICD-10-CM | POA: Diagnosis not present

## 2019-09-11 DIAGNOSIS — M48062 Spinal stenosis, lumbar region with neurogenic claudication: Secondary | ICD-10-CM | POA: Diagnosis not present

## 2019-09-11 DIAGNOSIS — I5042 Chronic combined systolic (congestive) and diastolic (congestive) heart failure: Secondary | ICD-10-CM | POA: Diagnosis not present

## 2019-09-11 DIAGNOSIS — I132 Hypertensive heart and chronic kidney disease with heart failure and with stage 5 chronic kidney disease, or end stage renal disease: Secondary | ICD-10-CM | POA: Diagnosis not present

## 2019-09-11 DIAGNOSIS — I252 Old myocardial infarction: Secondary | ICD-10-CM | POA: Diagnosis not present

## 2019-09-11 DIAGNOSIS — Z87891 Personal history of nicotine dependence: Secondary | ICD-10-CM | POA: Diagnosis not present

## 2019-09-11 DIAGNOSIS — F0280 Dementia in other diseases classified elsewhere without behavioral disturbance: Secondary | ICD-10-CM | POA: Diagnosis not present

## 2019-09-11 DIAGNOSIS — J449 Chronic obstructive pulmonary disease, unspecified: Secondary | ICD-10-CM | POA: Diagnosis not present

## 2019-09-11 DIAGNOSIS — Z8546 Personal history of malignant neoplasm of prostate: Secondary | ICD-10-CM | POA: Diagnosis not present

## 2019-09-11 DIAGNOSIS — I255 Ischemic cardiomyopathy: Secondary | ICD-10-CM | POA: Diagnosis not present

## 2019-09-11 DIAGNOSIS — D631 Anemia in chronic kidney disease: Secondary | ICD-10-CM | POA: Diagnosis not present

## 2019-09-11 DIAGNOSIS — Z6830 Body mass index (BMI) 30.0-30.9, adult: Secondary | ICD-10-CM | POA: Diagnosis not present

## 2019-09-11 DIAGNOSIS — Z9861 Coronary angioplasty status: Secondary | ICD-10-CM | POA: Diagnosis not present

## 2019-09-11 DIAGNOSIS — I2510 Atherosclerotic heart disease of native coronary artery without angina pectoris: Secondary | ICD-10-CM | POA: Diagnosis not present

## 2019-09-11 DIAGNOSIS — N400 Enlarged prostate without lower urinary tract symptoms: Secondary | ICD-10-CM | POA: Diagnosis not present

## 2019-09-11 DIAGNOSIS — E669 Obesity, unspecified: Secondary | ICD-10-CM | POA: Diagnosis not present

## 2019-09-11 DIAGNOSIS — M109 Gout, unspecified: Secondary | ICD-10-CM | POA: Diagnosis not present

## 2019-09-11 DIAGNOSIS — E039 Hypothyroidism, unspecified: Secondary | ICD-10-CM | POA: Diagnosis not present

## 2019-09-11 DIAGNOSIS — F329 Major depressive disorder, single episode, unspecified: Secondary | ICD-10-CM | POA: Diagnosis not present

## 2019-09-14 DIAGNOSIS — E1122 Type 2 diabetes mellitus with diabetic chronic kidney disease: Secondary | ICD-10-CM | POA: Diagnosis not present

## 2019-09-14 DIAGNOSIS — I5042 Chronic combined systolic (congestive) and diastolic (congestive) heart failure: Secondary | ICD-10-CM | POA: Diagnosis not present

## 2019-09-14 DIAGNOSIS — N186 End stage renal disease: Secondary | ICD-10-CM | POA: Diagnosis not present

## 2019-09-14 DIAGNOSIS — I5082 Biventricular heart failure: Secondary | ICD-10-CM | POA: Diagnosis not present

## 2019-09-14 DIAGNOSIS — K8309 Other cholangitis: Secondary | ICD-10-CM | POA: Diagnosis not present

## 2019-09-14 DIAGNOSIS — I132 Hypertensive heart and chronic kidney disease with heart failure and with stage 5 chronic kidney disease, or end stage renal disease: Secondary | ICD-10-CM | POA: Diagnosis not present

## 2019-09-16 DIAGNOSIS — I5082 Biventricular heart failure: Secondary | ICD-10-CM | POA: Diagnosis not present

## 2019-09-16 DIAGNOSIS — K8309 Other cholangitis: Secondary | ICD-10-CM | POA: Diagnosis not present

## 2019-09-16 DIAGNOSIS — E1122 Type 2 diabetes mellitus with diabetic chronic kidney disease: Secondary | ICD-10-CM | POA: Diagnosis not present

## 2019-09-16 DIAGNOSIS — I132 Hypertensive heart and chronic kidney disease with heart failure and with stage 5 chronic kidney disease, or end stage renal disease: Secondary | ICD-10-CM | POA: Diagnosis not present

## 2019-09-16 DIAGNOSIS — I5042 Chronic combined systolic (congestive) and diastolic (congestive) heart failure: Secondary | ICD-10-CM | POA: Diagnosis not present

## 2019-09-16 DIAGNOSIS — N186 End stage renal disease: Secondary | ICD-10-CM | POA: Diagnosis not present

## 2019-09-18 ENCOUNTER — Other Ambulatory Visit: Payer: Self-pay | Admitting: Internal Medicine

## 2019-09-21 DIAGNOSIS — I5042 Chronic combined systolic (congestive) and diastolic (congestive) heart failure: Secondary | ICD-10-CM | POA: Diagnosis not present

## 2019-09-21 DIAGNOSIS — K8309 Other cholangitis: Secondary | ICD-10-CM | POA: Diagnosis not present

## 2019-09-21 DIAGNOSIS — I5082 Biventricular heart failure: Secondary | ICD-10-CM | POA: Diagnosis not present

## 2019-09-21 DIAGNOSIS — N186 End stage renal disease: Secondary | ICD-10-CM | POA: Diagnosis not present

## 2019-09-21 DIAGNOSIS — I132 Hypertensive heart and chronic kidney disease with heart failure and with stage 5 chronic kidney disease, or end stage renal disease: Secondary | ICD-10-CM | POA: Diagnosis not present

## 2019-09-21 DIAGNOSIS — E1122 Type 2 diabetes mellitus with diabetic chronic kidney disease: Secondary | ICD-10-CM | POA: Diagnosis not present

## 2019-09-23 DIAGNOSIS — I132 Hypertensive heart and chronic kidney disease with heart failure and with stage 5 chronic kidney disease, or end stage renal disease: Secondary | ICD-10-CM | POA: Diagnosis not present

## 2019-09-23 DIAGNOSIS — E1122 Type 2 diabetes mellitus with diabetic chronic kidney disease: Secondary | ICD-10-CM | POA: Diagnosis not present

## 2019-09-23 DIAGNOSIS — I5042 Chronic combined systolic (congestive) and diastolic (congestive) heart failure: Secondary | ICD-10-CM | POA: Diagnosis not present

## 2019-09-23 DIAGNOSIS — K8309 Other cholangitis: Secondary | ICD-10-CM | POA: Diagnosis not present

## 2019-09-23 DIAGNOSIS — I5082 Biventricular heart failure: Secondary | ICD-10-CM | POA: Diagnosis not present

## 2019-09-23 DIAGNOSIS — N186 End stage renal disease: Secondary | ICD-10-CM | POA: Diagnosis not present

## 2019-09-24 IMAGING — US US EXTREM UP *R* LTD
1 series · 14 of 22 positions shown · non-contrast
Comparison: 08/20/2018

CLINICAL DATA: Recurrent bacteremia and history of end-stage renal
disease with right upper arm AV fistula.

EXAM:
ULTRASOUND RIGHT UPPER EXTREMITY LIMITED
TECHNIQUE: Ultrasound examination of the upper extremity soft tissues was
performed in the area of clinical concern.

[Series 1: us extrem up *right* ltd · 0.07mm/px · 14 of 22 slices shown]
[im 1/22]
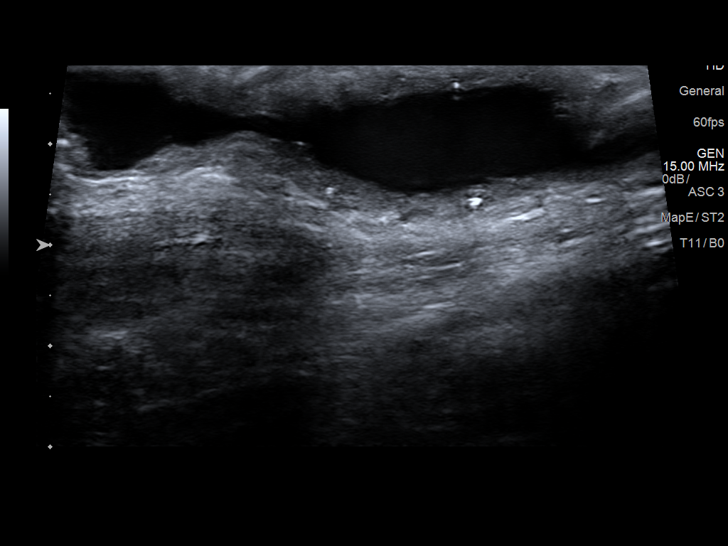
[im 3/22]
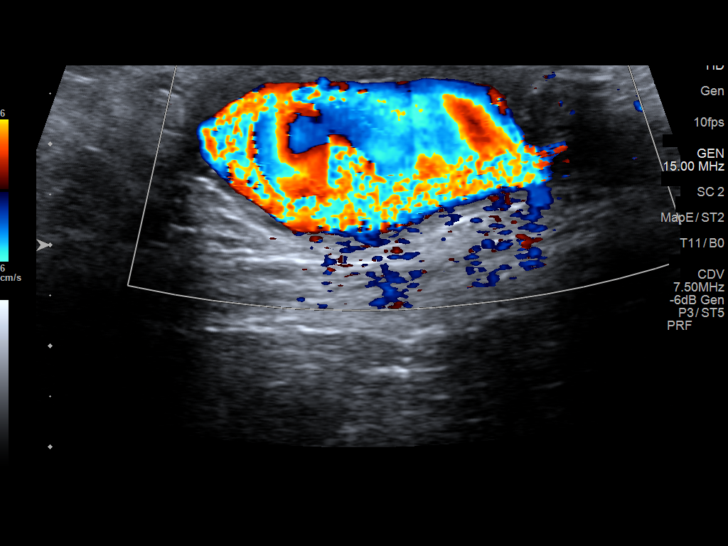
[im 4/22]
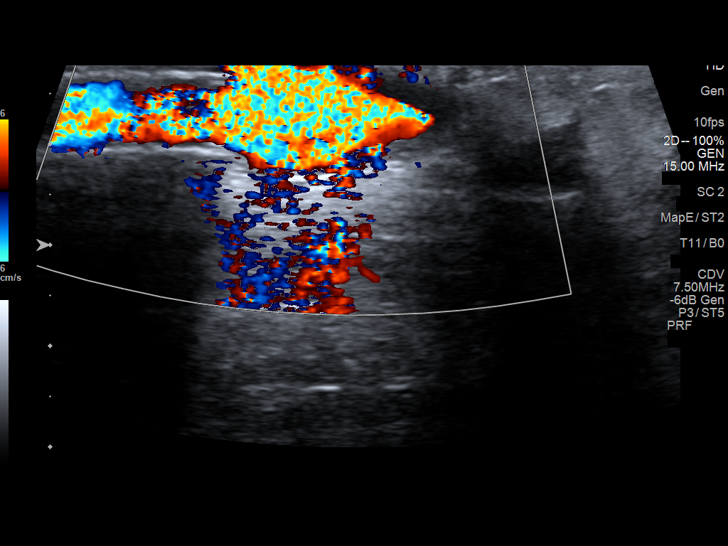
[im 6/22]
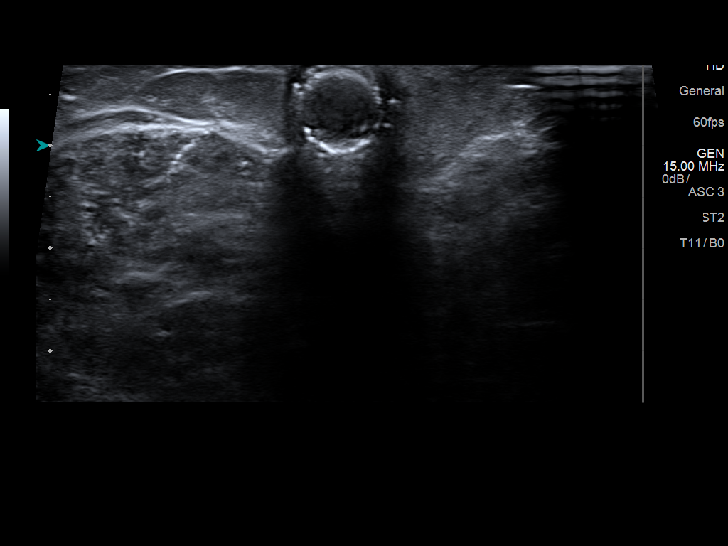
[im 8/22]
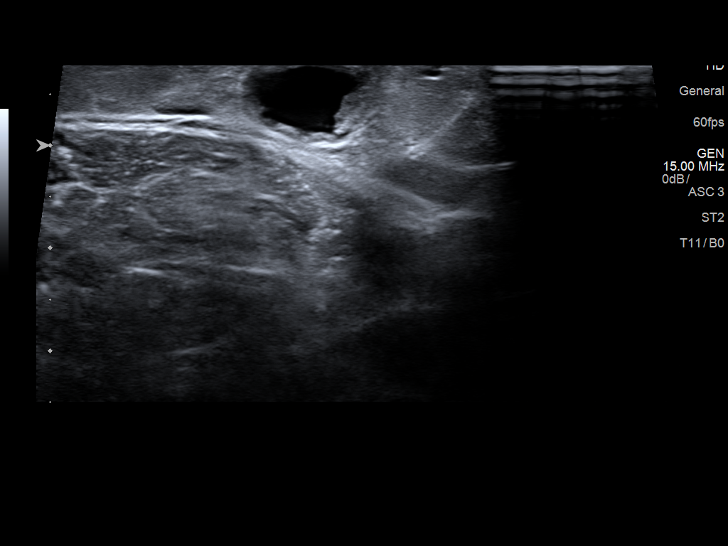
[im 9/22]
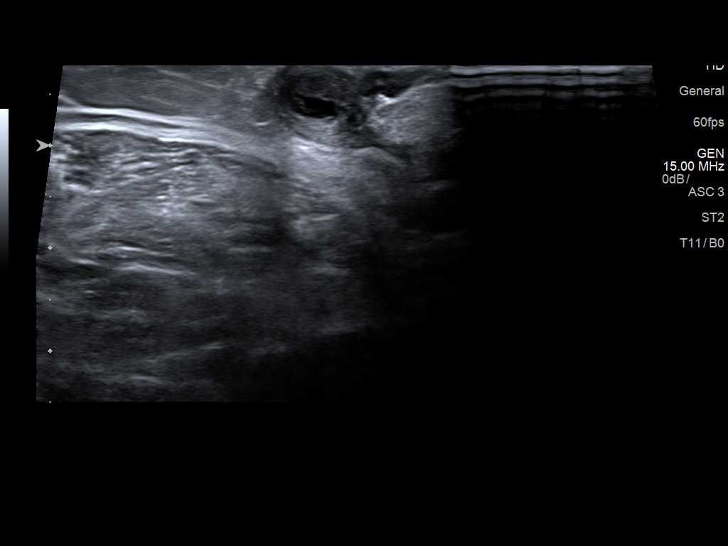
[im 11/22]
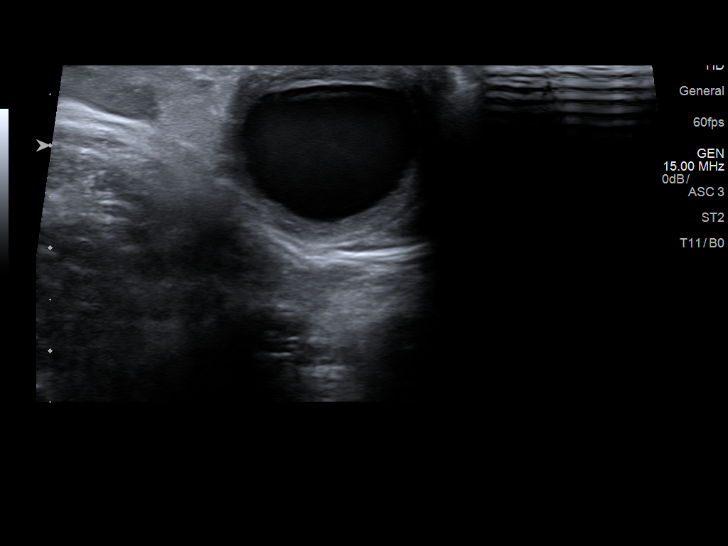
[im 12/22]
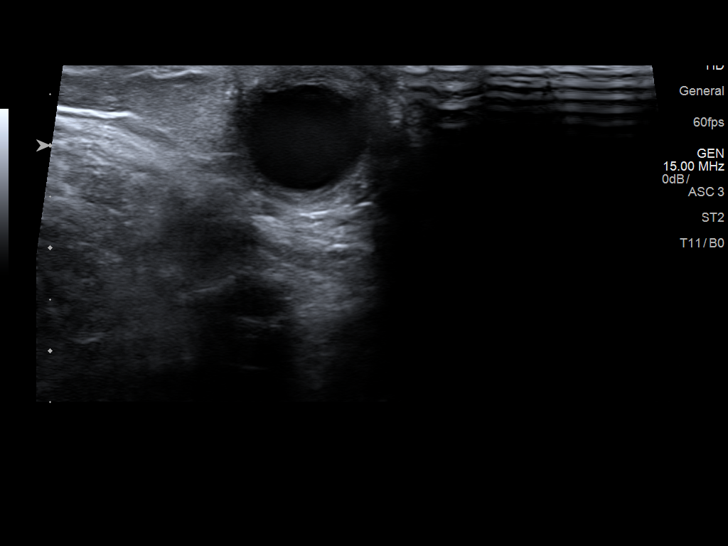
[im 14/22]
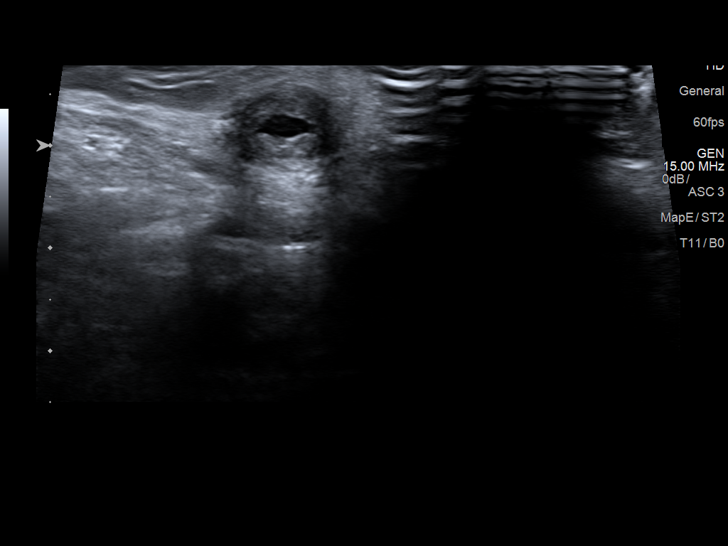
[im 15/22]
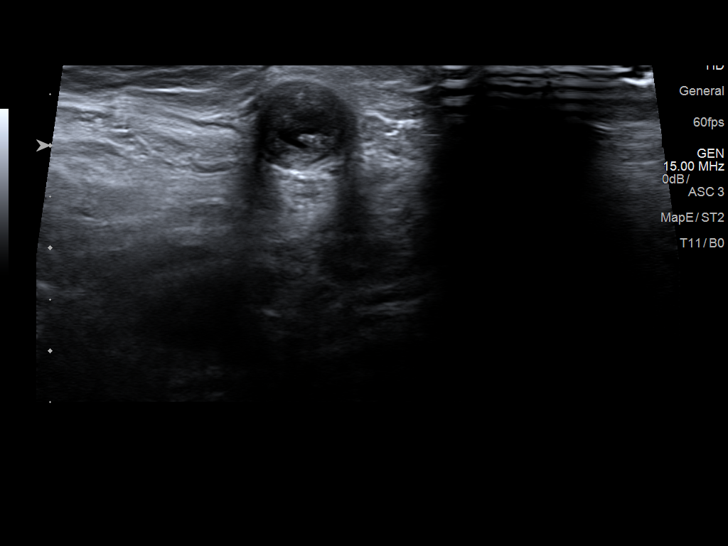
[im 17/22]
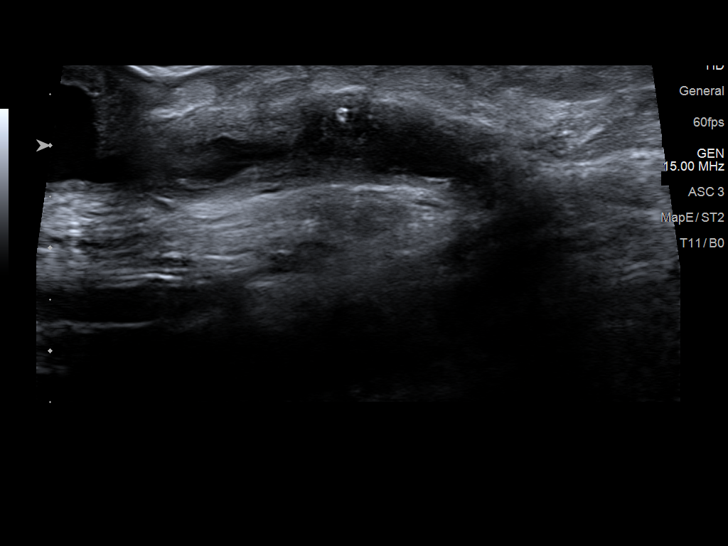
[im 19/22]
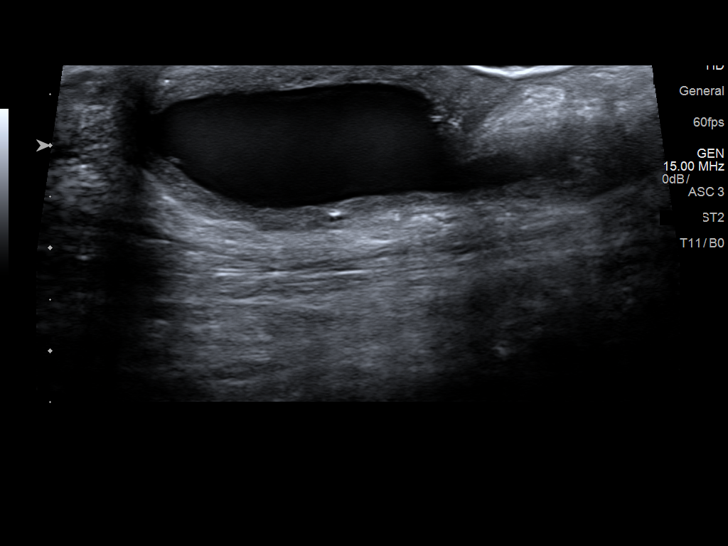
[im 20/22]
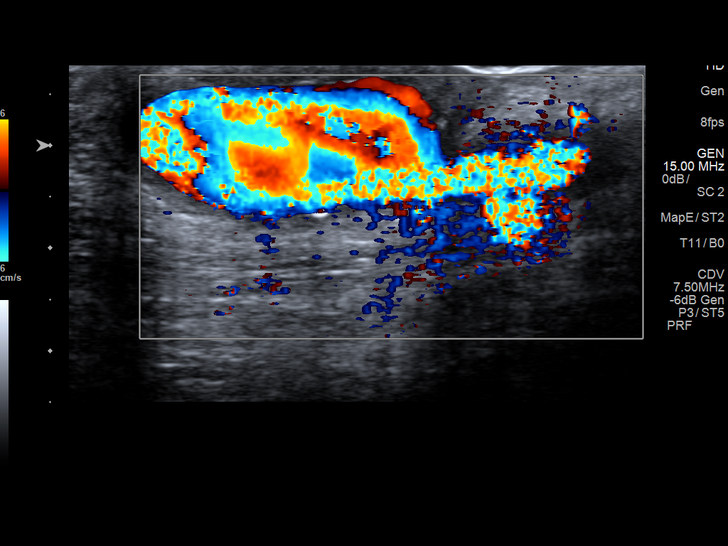
[im 22/22]
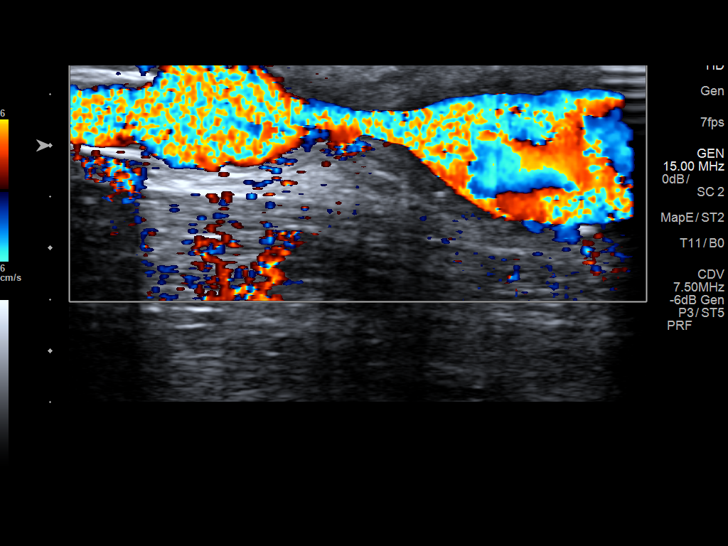

[14 of 22 positions shown; findings below may reference images not displayed]

FINDINGS: Aneurysmal right upper arm AV fistula shows patency and flow. There
is a mild amount wall thickening and mild mural thrombus in
different portions of the fistula. A segment of a venous outflow
stent is also identified near the axilla. No evidence of abnormal
fluid collection or focal abscess surrounding the fistula.
IMPRESSION: Patent right upper arm AV fistula with some areas of wall thickening
and mild nonocclusive mural thrombus. Partial visualization of
axillary venous outflow stent. No evidence of abnormal fluid
collection or abscess surrounding the fistula.

## 2019-09-28 ENCOUNTER — Other Ambulatory Visit: Payer: Self-pay

## 2019-09-28 ENCOUNTER — Other Ambulatory Visit: Payer: Self-pay | Admitting: Internal Medicine

## 2019-09-28 DIAGNOSIS — I5082 Biventricular heart failure: Secondary | ICD-10-CM | POA: Diagnosis not present

## 2019-09-28 DIAGNOSIS — K8309 Other cholangitis: Secondary | ICD-10-CM | POA: Diagnosis not present

## 2019-09-28 DIAGNOSIS — E1122 Type 2 diabetes mellitus with diabetic chronic kidney disease: Secondary | ICD-10-CM | POA: Diagnosis not present

## 2019-09-28 DIAGNOSIS — N186 End stage renal disease: Secondary | ICD-10-CM | POA: Diagnosis not present

## 2019-09-28 DIAGNOSIS — I5042 Chronic combined systolic (congestive) and diastolic (congestive) heart failure: Secondary | ICD-10-CM | POA: Diagnosis not present

## 2019-09-28 DIAGNOSIS — I132 Hypertensive heart and chronic kidney disease with heart failure and with stage 5 chronic kidney disease, or end stage renal disease: Secondary | ICD-10-CM | POA: Diagnosis not present

## 2019-09-28 MED ORDER — GABAPENTIN 300 MG PO CAPS: ORAL_CAPSULE | ORAL | 0 refills | Status: AC

## 2019-09-30 DIAGNOSIS — I5082 Biventricular heart failure: Secondary | ICD-10-CM | POA: Diagnosis not present

## 2019-09-30 DIAGNOSIS — K8309 Other cholangitis: Secondary | ICD-10-CM | POA: Diagnosis not present

## 2019-09-30 DIAGNOSIS — I5042 Chronic combined systolic (congestive) and diastolic (congestive) heart failure: Secondary | ICD-10-CM | POA: Diagnosis not present

## 2019-09-30 DIAGNOSIS — E1122 Type 2 diabetes mellitus with diabetic chronic kidney disease: Secondary | ICD-10-CM | POA: Diagnosis not present

## 2019-09-30 DIAGNOSIS — N186 End stage renal disease: Secondary | ICD-10-CM | POA: Diagnosis not present

## 2019-09-30 DIAGNOSIS — I132 Hypertensive heart and chronic kidney disease with heart failure and with stage 5 chronic kidney disease, or end stage renal disease: Secondary | ICD-10-CM | POA: Diagnosis not present

## 2019-10-02 DIAGNOSIS — N186 End stage renal disease: Secondary | ICD-10-CM | POA: Diagnosis not present

## 2019-10-02 DIAGNOSIS — Z992 Dependence on renal dialysis: Secondary | ICD-10-CM | POA: Diagnosis not present

## 2019-10-02 DIAGNOSIS — E1129 Type 2 diabetes mellitus with other diabetic kidney complication: Secondary | ICD-10-CM | POA: Diagnosis not present

## 2019-10-04 DIAGNOSIS — N186 End stage renal disease: Secondary | ICD-10-CM | POA: Diagnosis not present

## 2019-10-04 DIAGNOSIS — E119 Type 2 diabetes mellitus without complications: Secondary | ICD-10-CM | POA: Diagnosis not present

## 2019-10-04 DIAGNOSIS — D631 Anemia in chronic kidney disease: Secondary | ICD-10-CM | POA: Diagnosis not present

## 2019-10-04 DIAGNOSIS — Z992 Dependence on renal dialysis: Secondary | ICD-10-CM | POA: Diagnosis not present

## 2019-10-04 DIAGNOSIS — N2581 Secondary hyperparathyroidism of renal origin: Secondary | ICD-10-CM | POA: Diagnosis not present

## 2019-10-05 DIAGNOSIS — I5082 Biventricular heart failure: Secondary | ICD-10-CM | POA: Diagnosis not present

## 2019-10-05 DIAGNOSIS — K8309 Other cholangitis: Secondary | ICD-10-CM | POA: Diagnosis not present

## 2019-10-05 DIAGNOSIS — I132 Hypertensive heart and chronic kidney disease with heart failure and with stage 5 chronic kidney disease, or end stage renal disease: Secondary | ICD-10-CM | POA: Diagnosis not present

## 2019-10-05 DIAGNOSIS — I5042 Chronic combined systolic (congestive) and diastolic (congestive) heart failure: Secondary | ICD-10-CM | POA: Diagnosis not present

## 2019-10-05 DIAGNOSIS — E1122 Type 2 diabetes mellitus with diabetic chronic kidney disease: Secondary | ICD-10-CM | POA: Diagnosis not present

## 2019-10-05 DIAGNOSIS — N186 End stage renal disease: Secondary | ICD-10-CM | POA: Diagnosis not present

## 2019-10-06 DIAGNOSIS — E119 Type 2 diabetes mellitus without complications: Secondary | ICD-10-CM | POA: Diagnosis not present

## 2019-10-06 DIAGNOSIS — D631 Anemia in chronic kidney disease: Secondary | ICD-10-CM | POA: Diagnosis not present

## 2019-10-06 DIAGNOSIS — Z992 Dependence on renal dialysis: Secondary | ICD-10-CM | POA: Diagnosis not present

## 2019-10-06 DIAGNOSIS — N186 End stage renal disease: Secondary | ICD-10-CM | POA: Diagnosis not present

## 2019-10-06 DIAGNOSIS — N2581 Secondary hyperparathyroidism of renal origin: Secondary | ICD-10-CM | POA: Diagnosis not present

## 2019-10-07 ENCOUNTER — Telehealth: Payer: Self-pay

## 2019-10-07 DIAGNOSIS — I5042 Chronic combined systolic (congestive) and diastolic (congestive) heart failure: Secondary | ICD-10-CM | POA: Diagnosis not present

## 2019-10-07 DIAGNOSIS — E1122 Type 2 diabetes mellitus with diabetic chronic kidney disease: Secondary | ICD-10-CM | POA: Diagnosis not present

## 2019-10-07 DIAGNOSIS — K8309 Other cholangitis: Secondary | ICD-10-CM | POA: Diagnosis not present

## 2019-10-07 DIAGNOSIS — N186 End stage renal disease: Secondary | ICD-10-CM | POA: Diagnosis not present

## 2019-10-07 DIAGNOSIS — I132 Hypertensive heart and chronic kidney disease with heart failure and with stage 5 chronic kidney disease, or end stage renal disease: Secondary | ICD-10-CM | POA: Diagnosis not present

## 2019-10-07 DIAGNOSIS — I5082 Biventricular heart failure: Secondary | ICD-10-CM | POA: Diagnosis not present

## 2019-10-07 MED ORDER — PRALUENT 150 MG/ML ~~LOC~~ SOAJ: 1.0000 "pen " | SUBCUTANEOUS | 3 refills | Status: AC

## 2019-10-07 NOTE — Telephone Encounter (Signed)
Called and instructed the pt to contact the [employer] to apply for a grant towards the praluent. Pt voiced understanding and instructed to call us if they have anymore issues

## 2019-10-07 NOTE — Addendum Note (Signed)
Addended by: Allean Found on: 10/07/2019 03:09 PM   Modules accepted: Orders

## 2019-10-07 NOTE — Telephone Encounter (Signed)
Called and spoke ww/pt letting them know the new approval date and they voiced understanding and let me know that they were approved through the [employer] to cover the praluent at the pharmacy

## 2019-10-07 NOTE — Telephone Encounter (Signed)
-----   Message from Rockne Menghini, [place] sent at 10/06/2019 10:08 AM EST ----- Ms. Sauseda apparently got letter saying Praluent not covered.  Not sure if from Insurance or PASS

## 2019-10-08 ENCOUNTER — Telehealth: Payer: Self-pay | Admitting: Internal Medicine

## 2019-10-08 DIAGNOSIS — N186 End stage renal disease: Secondary | ICD-10-CM | POA: Diagnosis not present

## 2019-10-08 DIAGNOSIS — N2581 Secondary hyperparathyroidism of renal origin: Secondary | ICD-10-CM | POA: Diagnosis not present

## 2019-10-08 DIAGNOSIS — Z992 Dependence on renal dialysis: Secondary | ICD-10-CM | POA: Diagnosis not present

## 2019-10-08 DIAGNOSIS — D631 Anemia in chronic kidney disease: Secondary | ICD-10-CM | POA: Diagnosis not present

## 2019-10-08 DIAGNOSIS — E119 Type 2 diabetes mellitus without complications: Secondary | ICD-10-CM | POA: Diagnosis not present

## 2019-10-08 NOTE — Telephone Encounter (Signed)
Home Health Verbal Orders - Caller/Agency: [facility] Number: [phone] Requesting OT/PT/Skilled Nursing/Social Work/Speech Therapy: PT Frequency: 2x 9 weeks

## 2019-10-08 NOTE — Telephone Encounter (Signed)
Called [name] there was no answer LMOM w/MD response.Marland KitchenJohny [name]

## 2019-10-08 NOTE — Telephone Encounter (Signed)
Ok for verbals 

## 2019-10-09 ENCOUNTER — Ambulatory Visit: Payer: Medicare Other | Admitting: Vascular Surgery

## 2019-10-10 DIAGNOSIS — N2581 Secondary hyperparathyroidism of renal origin: Secondary | ICD-10-CM | POA: Diagnosis not present

## 2019-10-10 DIAGNOSIS — Z992 Dependence on renal dialysis: Secondary | ICD-10-CM | POA: Diagnosis not present

## 2019-10-10 DIAGNOSIS — E119 Type 2 diabetes mellitus without complications: Secondary | ICD-10-CM | POA: Diagnosis not present

## 2019-10-10 DIAGNOSIS — N186 End stage renal disease: Secondary | ICD-10-CM | POA: Diagnosis not present

## 2019-10-10 DIAGNOSIS — D631 Anemia in chronic kidney disease: Secondary | ICD-10-CM | POA: Diagnosis not present

## 2019-10-11 DIAGNOSIS — I2510 Atherosclerotic heart disease of native coronary artery without angina pectoris: Secondary | ICD-10-CM | POA: Diagnosis not present

## 2019-10-11 DIAGNOSIS — E669 Obesity, unspecified: Secondary | ICD-10-CM | POA: Diagnosis not present

## 2019-10-11 DIAGNOSIS — I5042 Chronic combined systolic (congestive) and diastolic (congestive) heart failure: Secondary | ICD-10-CM | POA: Diagnosis not present

## 2019-10-11 DIAGNOSIS — Z8546 Personal history of malignant neoplasm of prostate: Secondary | ICD-10-CM | POA: Diagnosis not present

## 2019-10-11 DIAGNOSIS — I255 Ischemic cardiomyopathy: Secondary | ICD-10-CM | POA: Diagnosis not present

## 2019-10-11 DIAGNOSIS — I5082 Biventricular heart failure: Secondary | ICD-10-CM | POA: Diagnosis not present

## 2019-10-11 DIAGNOSIS — Z6830 Body mass index (BMI) 30.0-30.9, adult: Secondary | ICD-10-CM | POA: Diagnosis not present

## 2019-10-11 DIAGNOSIS — F0280 Dementia in other diseases classified elsewhere without behavioral disturbance: Secondary | ICD-10-CM | POA: Diagnosis not present

## 2019-10-11 DIAGNOSIS — I252 Old myocardial infarction: Secondary | ICD-10-CM | POA: Diagnosis not present

## 2019-10-11 DIAGNOSIS — M48062 Spinal stenosis, lumbar region with neurogenic claudication: Secondary | ICD-10-CM | POA: Diagnosis not present

## 2019-10-11 DIAGNOSIS — M5412 Radiculopathy, cervical region: Secondary | ICD-10-CM | POA: Diagnosis not present

## 2019-10-11 DIAGNOSIS — J449 Chronic obstructive pulmonary disease, unspecified: Secondary | ICD-10-CM | POA: Diagnosis not present

## 2019-10-11 DIAGNOSIS — E039 Hypothyroidism, unspecified: Secondary | ICD-10-CM | POA: Diagnosis not present

## 2019-10-11 DIAGNOSIS — D631 Anemia in chronic kidney disease: Secondary | ICD-10-CM | POA: Diagnosis not present

## 2019-10-11 DIAGNOSIS — N186 End stage renal disease: Secondary | ICD-10-CM | POA: Diagnosis not present

## 2019-10-11 DIAGNOSIS — N400 Enlarged prostate without lower urinary tract symptoms: Secondary | ICD-10-CM | POA: Diagnosis not present

## 2019-10-11 DIAGNOSIS — Z87891 Personal history of nicotine dependence: Secondary | ICD-10-CM | POA: Diagnosis not present

## 2019-10-11 DIAGNOSIS — F329 Major depressive disorder, single episode, unspecified: Secondary | ICD-10-CM | POA: Diagnosis not present

## 2019-10-11 DIAGNOSIS — M109 Gout, unspecified: Secondary | ICD-10-CM | POA: Diagnosis not present

## 2019-10-11 DIAGNOSIS — E1142 Type 2 diabetes mellitus with diabetic polyneuropathy: Secondary | ICD-10-CM | POA: Diagnosis not present

## 2019-10-11 DIAGNOSIS — K8309 Other cholangitis: Secondary | ICD-10-CM | POA: Diagnosis not present

## 2019-10-11 DIAGNOSIS — E1122 Type 2 diabetes mellitus with diabetic chronic kidney disease: Secondary | ICD-10-CM | POA: Diagnosis not present

## 2019-10-11 DIAGNOSIS — I132 Hypertensive heart and chronic kidney disease with heart failure and with stage 5 chronic kidney disease, or end stage renal disease: Secondary | ICD-10-CM | POA: Diagnosis not present

## 2019-10-11 DIAGNOSIS — Z9861 Coronary angioplasty status: Secondary | ICD-10-CM | POA: Diagnosis not present

## 2019-10-11 DIAGNOSIS — E1151 Type 2 diabetes mellitus with diabetic peripheral angiopathy without gangrene: Secondary | ICD-10-CM | POA: Diagnosis not present

## 2019-10-12 DIAGNOSIS — I132 Hypertensive heart and chronic kidney disease with heart failure and with stage 5 chronic kidney disease, or end stage renal disease: Secondary | ICD-10-CM | POA: Diagnosis not present

## 2019-10-12 DIAGNOSIS — N186 End stage renal disease: Secondary | ICD-10-CM | POA: Diagnosis not present

## 2019-10-12 DIAGNOSIS — I5042 Chronic combined systolic (congestive) and diastolic (congestive) heart failure: Secondary | ICD-10-CM | POA: Diagnosis not present

## 2019-10-12 DIAGNOSIS — I5082 Biventricular heart failure: Secondary | ICD-10-CM | POA: Diagnosis not present

## 2019-10-12 DIAGNOSIS — K8309 Other cholangitis: Secondary | ICD-10-CM | POA: Diagnosis not present

## 2019-10-12 DIAGNOSIS — E1122 Type 2 diabetes mellitus with diabetic chronic kidney disease: Secondary | ICD-10-CM | POA: Diagnosis not present

## 2019-10-13 DIAGNOSIS — N2581 Secondary hyperparathyroidism of renal origin: Secondary | ICD-10-CM | POA: Diagnosis not present

## 2019-10-13 DIAGNOSIS — E119 Type 2 diabetes mellitus without complications: Secondary | ICD-10-CM | POA: Diagnosis not present

## 2019-10-13 DIAGNOSIS — D631 Anemia in chronic kidney disease: Secondary | ICD-10-CM | POA: Diagnosis not present

## 2019-10-13 DIAGNOSIS — N186 End stage renal disease: Secondary | ICD-10-CM | POA: Diagnosis not present

## 2019-10-13 DIAGNOSIS — Z992 Dependence on renal dialysis: Secondary | ICD-10-CM | POA: Diagnosis not present

## 2019-10-14 DIAGNOSIS — K8309 Other cholangitis: Secondary | ICD-10-CM | POA: Diagnosis not present

## 2019-10-14 DIAGNOSIS — E1122 Type 2 diabetes mellitus with diabetic chronic kidney disease: Secondary | ICD-10-CM | POA: Diagnosis not present

## 2019-10-14 DIAGNOSIS — I132 Hypertensive heart and chronic kidney disease with heart failure and with stage 5 chronic kidney disease, or end stage renal disease: Secondary | ICD-10-CM | POA: Diagnosis not present

## 2019-10-14 DIAGNOSIS — N186 End stage renal disease: Secondary | ICD-10-CM | POA: Diagnosis not present

## 2019-10-14 DIAGNOSIS — I5082 Biventricular heart failure: Secondary | ICD-10-CM | POA: Diagnosis not present

## 2019-10-14 DIAGNOSIS — I5042 Chronic combined systolic (congestive) and diastolic (congestive) heart failure: Secondary | ICD-10-CM | POA: Diagnosis not present

## 2019-10-15 ENCOUNTER — Other Ambulatory Visit: Payer: Self-pay | Admitting: Endocrinology

## 2019-10-15 DIAGNOSIS — N186 End stage renal disease: Secondary | ICD-10-CM | POA: Diagnosis not present

## 2019-10-15 DIAGNOSIS — N2581 Secondary hyperparathyroidism of renal origin: Secondary | ICD-10-CM | POA: Diagnosis not present

## 2019-10-15 DIAGNOSIS — Z992 Dependence on renal dialysis: Secondary | ICD-10-CM | POA: Diagnosis not present

## 2019-10-15 DIAGNOSIS — D631 Anemia in chronic kidney disease: Secondary | ICD-10-CM | POA: Diagnosis not present

## 2019-10-15 DIAGNOSIS — E119 Type 2 diabetes mellitus without complications: Secondary | ICD-10-CM | POA: Diagnosis not present

## 2019-10-17 DIAGNOSIS — N186 End stage renal disease: Secondary | ICD-10-CM | POA: Diagnosis not present

## 2019-10-17 DIAGNOSIS — D631 Anemia in chronic kidney disease: Secondary | ICD-10-CM | POA: Diagnosis not present

## 2019-10-17 DIAGNOSIS — E119 Type 2 diabetes mellitus without complications: Secondary | ICD-10-CM | POA: Diagnosis not present

## 2019-10-17 DIAGNOSIS — N2581 Secondary hyperparathyroidism of renal origin: Secondary | ICD-10-CM | POA: Diagnosis not present

## 2019-10-17 DIAGNOSIS — Z992 Dependence on renal dialysis: Secondary | ICD-10-CM | POA: Diagnosis not present

## 2019-10-19 DIAGNOSIS — I5082 Biventricular heart failure: Secondary | ICD-10-CM | POA: Diagnosis not present

## 2019-10-19 DIAGNOSIS — K8309 Other cholangitis: Secondary | ICD-10-CM | POA: Diagnosis not present

## 2019-10-19 DIAGNOSIS — I5042 Chronic combined systolic (congestive) and diastolic (congestive) heart failure: Secondary | ICD-10-CM | POA: Diagnosis not present

## 2019-10-19 DIAGNOSIS — E1122 Type 2 diabetes mellitus with diabetic chronic kidney disease: Secondary | ICD-10-CM | POA: Diagnosis not present

## 2019-10-19 DIAGNOSIS — I132 Hypertensive heart and chronic kidney disease with heart failure and with stage 5 chronic kidney disease, or end stage renal disease: Secondary | ICD-10-CM | POA: Diagnosis not present

## 2019-10-19 DIAGNOSIS — N186 End stage renal disease: Secondary | ICD-10-CM | POA: Diagnosis not present

## 2019-10-20 DIAGNOSIS — D631 Anemia in chronic kidney disease: Secondary | ICD-10-CM | POA: Diagnosis not present

## 2019-10-20 DIAGNOSIS — Z992 Dependence on renal dialysis: Secondary | ICD-10-CM | POA: Diagnosis not present

## 2019-10-20 DIAGNOSIS — E119 Type 2 diabetes mellitus without complications: Secondary | ICD-10-CM | POA: Diagnosis not present

## 2019-10-20 DIAGNOSIS — N2581 Secondary hyperparathyroidism of renal origin: Secondary | ICD-10-CM | POA: Diagnosis not present

## 2019-10-20 DIAGNOSIS — N186 End stage renal disease: Secondary | ICD-10-CM | POA: Diagnosis not present

## 2019-10-21 DIAGNOSIS — I5042 Chronic combined systolic (congestive) and diastolic (congestive) heart failure: Secondary | ICD-10-CM | POA: Diagnosis not present

## 2019-10-21 DIAGNOSIS — I5082 Biventricular heart failure: Secondary | ICD-10-CM | POA: Diagnosis not present

## 2019-10-21 DIAGNOSIS — I132 Hypertensive heart and chronic kidney disease with heart failure and with stage 5 chronic kidney disease, or end stage renal disease: Secondary | ICD-10-CM | POA: Diagnosis not present

## 2019-10-21 DIAGNOSIS — K8309 Other cholangitis: Secondary | ICD-10-CM | POA: Diagnosis not present

## 2019-10-21 DIAGNOSIS — N186 End stage renal disease: Secondary | ICD-10-CM | POA: Diagnosis not present

## 2019-10-21 DIAGNOSIS — E1122 Type 2 diabetes mellitus with diabetic chronic kidney disease: Secondary | ICD-10-CM | POA: Diagnosis not present

## 2019-10-22 DIAGNOSIS — E119 Type 2 diabetes mellitus without complications: Secondary | ICD-10-CM | POA: Diagnosis not present

## 2019-10-22 DIAGNOSIS — N186 End stage renal disease: Secondary | ICD-10-CM | POA: Diagnosis not present

## 2019-10-22 DIAGNOSIS — N2581 Secondary hyperparathyroidism of renal origin: Secondary | ICD-10-CM | POA: Diagnosis not present

## 2019-10-22 DIAGNOSIS — D631 Anemia in chronic kidney disease: Secondary | ICD-10-CM | POA: Diagnosis not present

## 2019-10-22 DIAGNOSIS — Z992 Dependence on renal dialysis: Secondary | ICD-10-CM | POA: Diagnosis not present

## 2019-10-24 DIAGNOSIS — Z992 Dependence on renal dialysis: Secondary | ICD-10-CM | POA: Diagnosis not present

## 2019-10-24 DIAGNOSIS — D631 Anemia in chronic kidney disease: Secondary | ICD-10-CM | POA: Diagnosis not present

## 2019-10-24 DIAGNOSIS — N186 End stage renal disease: Secondary | ICD-10-CM | POA: Diagnosis not present

## 2019-10-24 DIAGNOSIS — E119 Type 2 diabetes mellitus without complications: Secondary | ICD-10-CM | POA: Diagnosis not present

## 2019-10-24 DIAGNOSIS — N2581 Secondary hyperparathyroidism of renal origin: Secondary | ICD-10-CM | POA: Diagnosis not present

## 2019-10-26 DIAGNOSIS — E1122 Type 2 diabetes mellitus with diabetic chronic kidney disease: Secondary | ICD-10-CM | POA: Diagnosis not present

## 2019-10-26 DIAGNOSIS — K8309 Other cholangitis: Secondary | ICD-10-CM | POA: Diagnosis not present

## 2019-10-26 DIAGNOSIS — I5082 Biventricular heart failure: Secondary | ICD-10-CM | POA: Diagnosis not present

## 2019-10-26 DIAGNOSIS — N186 End stage renal disease: Secondary | ICD-10-CM | POA: Diagnosis not present

## 2019-10-26 DIAGNOSIS — I5042 Chronic combined systolic (congestive) and diastolic (congestive) heart failure: Secondary | ICD-10-CM | POA: Diagnosis not present

## 2019-10-26 DIAGNOSIS — I132 Hypertensive heart and chronic kidney disease with heart failure and with stage 5 chronic kidney disease, or end stage renal disease: Secondary | ICD-10-CM | POA: Diagnosis not present

## 2019-10-27 DIAGNOSIS — N186 End stage renal disease: Secondary | ICD-10-CM | POA: Diagnosis not present

## 2019-10-27 DIAGNOSIS — Z992 Dependence on renal dialysis: Secondary | ICD-10-CM | POA: Diagnosis not present

## 2019-10-27 DIAGNOSIS — E119 Type 2 diabetes mellitus without complications: Secondary | ICD-10-CM | POA: Diagnosis not present

## 2019-10-27 DIAGNOSIS — D631 Anemia in chronic kidney disease: Secondary | ICD-10-CM | POA: Diagnosis not present

## 2019-10-27 DIAGNOSIS — N2581 Secondary hyperparathyroidism of renal origin: Secondary | ICD-10-CM | POA: Diagnosis not present

## 2019-10-28 DIAGNOSIS — I5082 Biventricular heart failure: Secondary | ICD-10-CM | POA: Diagnosis not present

## 2019-10-28 DIAGNOSIS — I5042 Chronic combined systolic (congestive) and diastolic (congestive) heart failure: Secondary | ICD-10-CM | POA: Diagnosis not present

## 2019-10-28 DIAGNOSIS — N186 End stage renal disease: Secondary | ICD-10-CM | POA: Diagnosis not present

## 2019-10-28 DIAGNOSIS — I132 Hypertensive heart and chronic kidney disease with heart failure and with stage 5 chronic kidney disease, or end stage renal disease: Secondary | ICD-10-CM | POA: Diagnosis not present

## 2019-10-28 DIAGNOSIS — E1122 Type 2 diabetes mellitus with diabetic chronic kidney disease: Secondary | ICD-10-CM | POA: Diagnosis not present

## 2019-10-28 DIAGNOSIS — K8309 Other cholangitis: Secondary | ICD-10-CM | POA: Diagnosis not present

## 2019-10-29 DIAGNOSIS — N2581 Secondary hyperparathyroidism of renal origin: Secondary | ICD-10-CM | POA: Diagnosis not present

## 2019-10-29 DIAGNOSIS — E119 Type 2 diabetes mellitus without complications: Secondary | ICD-10-CM | POA: Diagnosis not present

## 2019-10-29 DIAGNOSIS — D631 Anemia in chronic kidney disease: Secondary | ICD-10-CM | POA: Diagnosis not present

## 2019-10-29 DIAGNOSIS — Z992 Dependence on renal dialysis: Secondary | ICD-10-CM | POA: Diagnosis not present

## 2019-10-29 DIAGNOSIS — N186 End stage renal disease: Secondary | ICD-10-CM | POA: Diagnosis not present

## 2019-10-31 DIAGNOSIS — Z992 Dependence on renal dialysis: Secondary | ICD-10-CM | POA: Diagnosis not present

## 2019-10-31 DIAGNOSIS — D631 Anemia in chronic kidney disease: Secondary | ICD-10-CM | POA: Diagnosis not present

## 2019-10-31 DIAGNOSIS — N2581 Secondary hyperparathyroidism of renal origin: Secondary | ICD-10-CM | POA: Diagnosis not present

## 2019-10-31 DIAGNOSIS — E119 Type 2 diabetes mellitus without complications: Secondary | ICD-10-CM | POA: Diagnosis not present

## 2019-10-31 DIAGNOSIS — N186 End stage renal disease: Secondary | ICD-10-CM | POA: Diagnosis not present

## 2019-11-02 DIAGNOSIS — I5082 Biventricular heart failure: Secondary | ICD-10-CM | POA: Diagnosis not present

## 2019-11-02 DIAGNOSIS — I132 Hypertensive heart and chronic kidney disease with heart failure and with stage 5 chronic kidney disease, or end stage renal disease: Secondary | ICD-10-CM | POA: Diagnosis not present

## 2019-11-02 DIAGNOSIS — K8309 Other cholangitis: Secondary | ICD-10-CM | POA: Diagnosis not present

## 2019-11-02 DIAGNOSIS — I5042 Chronic combined systolic (congestive) and diastolic (congestive) heart failure: Secondary | ICD-10-CM | POA: Diagnosis not present

## 2019-11-02 DIAGNOSIS — E1129 Type 2 diabetes mellitus with other diabetic kidney complication: Secondary | ICD-10-CM | POA: Diagnosis not present

## 2019-11-02 DIAGNOSIS — Z992 Dependence on renal dialysis: Secondary | ICD-10-CM | POA: Diagnosis not present

## 2019-11-02 DIAGNOSIS — N186 End stage renal disease: Secondary | ICD-10-CM | POA: Diagnosis not present

## 2019-11-02 DIAGNOSIS — N2581 Secondary hyperparathyroidism of renal origin: Secondary | ICD-10-CM | POA: Diagnosis not present

## 2019-11-02 DIAGNOSIS — D631 Anemia in chronic kidney disease: Secondary | ICD-10-CM | POA: Diagnosis not present

## 2019-11-02 DIAGNOSIS — E1122 Type 2 diabetes mellitus with diabetic chronic kidney disease: Secondary | ICD-10-CM | POA: Diagnosis not present

## 2019-11-03 DIAGNOSIS — N186 End stage renal disease: Secondary | ICD-10-CM | POA: Diagnosis not present

## 2019-11-03 DIAGNOSIS — N2581 Secondary hyperparathyroidism of renal origin: Secondary | ICD-10-CM | POA: Diagnosis not present

## 2019-11-03 DIAGNOSIS — Z992 Dependence on renal dialysis: Secondary | ICD-10-CM | POA: Diagnosis not present

## 2019-11-03 DIAGNOSIS — D631 Anemia in chronic kidney disease: Secondary | ICD-10-CM | POA: Diagnosis not present

## 2019-11-04 DIAGNOSIS — N186 End stage renal disease: Secondary | ICD-10-CM | POA: Diagnosis not present

## 2019-11-04 DIAGNOSIS — I5082 Biventricular heart failure: Secondary | ICD-10-CM | POA: Diagnosis not present

## 2019-11-04 DIAGNOSIS — I132 Hypertensive heart and chronic kidney disease with heart failure and with stage 5 chronic kidney disease, or end stage renal disease: Secondary | ICD-10-CM | POA: Diagnosis not present

## 2019-11-04 DIAGNOSIS — I5042 Chronic combined systolic (congestive) and diastolic (congestive) heart failure: Secondary | ICD-10-CM | POA: Diagnosis not present

## 2019-11-04 DIAGNOSIS — K8309 Other cholangitis: Secondary | ICD-10-CM | POA: Diagnosis not present

## 2019-11-04 DIAGNOSIS — E1122 Type 2 diabetes mellitus with diabetic chronic kidney disease: Secondary | ICD-10-CM | POA: Diagnosis not present

## 2019-11-05 ENCOUNTER — Other Ambulatory Visit: Payer: Self-pay

## 2019-11-05 ENCOUNTER — Other Ambulatory Visit (HOSPITAL_COMMUNITY): Payer: Self-pay

## 2019-11-05 ENCOUNTER — Encounter (HOSPITAL_COMMUNITY): Payer: Self-pay

## 2019-11-05 ENCOUNTER — Inpatient Hospital Stay (HOSPITAL_COMMUNITY)
Admission: EM | Admit: 2019-11-05 | Discharge: 2019-11-30 | DRG: 871 | Disposition: E | Payer: Medicare Other | Attending: Pulmonary Disease | Admitting: Pulmonary Disease

## 2019-11-05 ENCOUNTER — Emergency Department (HOSPITAL_COMMUNITY): Payer: Medicare Other

## 2019-11-05 DIAGNOSIS — K559 Vascular disorder of intestine, unspecified: Secondary | ICD-10-CM | POA: Diagnosis not present

## 2019-11-05 DIAGNOSIS — R69 Illness, unspecified: Secondary | ICD-10-CM

## 2019-11-05 DIAGNOSIS — M109 Gout, unspecified: Secondary | ICD-10-CM | POA: Diagnosis present

## 2019-11-05 DIAGNOSIS — N2581 Secondary hyperparathyroidism of renal origin: Secondary | ICD-10-CM | POA: Diagnosis present

## 2019-11-05 DIAGNOSIS — F329 Major depressive disorder, single episode, unspecified: Secondary | ICD-10-CM | POA: Diagnosis present

## 2019-11-05 DIAGNOSIS — G4733 Obstructive sleep apnea (adult) (pediatric): Secondary | ICD-10-CM | POA: Diagnosis present

## 2019-11-05 DIAGNOSIS — G40101 Localization-related (focal) (partial) symptomatic epilepsy and epileptic syndromes with simple partial seizures, not intractable, with status epilepticus: Secondary | ICD-10-CM | POA: Diagnosis not present

## 2019-11-05 DIAGNOSIS — Z955 Presence of coronary angioplasty implant and graft: Secondary | ICD-10-CM

## 2019-11-05 DIAGNOSIS — G9341 Metabolic encephalopathy: Secondary | ICD-10-CM | POA: Diagnosis present

## 2019-11-05 DIAGNOSIS — R0602 Shortness of breath: Secondary | ICD-10-CM | POA: Diagnosis not present

## 2019-11-05 DIAGNOSIS — D631 Anemia in chronic kidney disease: Secondary | ICD-10-CM | POA: Diagnosis present

## 2019-11-05 DIAGNOSIS — Z515 Encounter for palliative care: Secondary | ICD-10-CM | POA: Diagnosis not present

## 2019-11-05 DIAGNOSIS — Z992 Dependence on renal dialysis: Secondary | ICD-10-CM | POA: Diagnosis not present

## 2019-11-05 DIAGNOSIS — Z4659 Encounter for fitting and adjustment of other gastrointestinal appliance and device: Secondary | ICD-10-CM

## 2019-11-05 DIAGNOSIS — R000 Tachycardia, unspecified: Secondary | ICD-10-CM | POA: Diagnosis not present

## 2019-11-05 DIAGNOSIS — I132 Hypertensive heart and chronic kidney disease with heart failure and with stage 5 chronic kidney disease, or end stage renal disease: Secondary | ICD-10-CM | POA: Diagnosis present

## 2019-11-05 DIAGNOSIS — K831 Obstruction of bile duct: Secondary | ICD-10-CM | POA: Diagnosis not present

## 2019-11-05 DIAGNOSIS — I2510 Atherosclerotic heart disease of native coronary artery without angina pectoris: Secondary | ICD-10-CM | POA: Diagnosis present

## 2019-11-05 DIAGNOSIS — J9602 Acute respiratory failure with hypercapnia: Secondary | ICD-10-CM | POA: Diagnosis not present

## 2019-11-05 DIAGNOSIS — E0590 Thyrotoxicosis, unspecified without thyrotoxic crisis or storm: Secondary | ICD-10-CM | POA: Diagnosis present

## 2019-11-05 DIAGNOSIS — J9601 Acute respiratory failure with hypoxia: Secondary | ICD-10-CM | POA: Diagnosis not present

## 2019-11-05 DIAGNOSIS — A4159 Other Gram-negative sepsis: Secondary | ICD-10-CM | POA: Diagnosis present

## 2019-11-05 DIAGNOSIS — E872 Acidosis: Secondary | ICD-10-CM | POA: Diagnosis present

## 2019-11-05 DIAGNOSIS — R945 Abnormal results of liver function studies: Secondary | ICD-10-CM | POA: Diagnosis not present

## 2019-11-05 DIAGNOSIS — N400 Enlarged prostate without lower urinary tract symptoms: Secondary | ICD-10-CM | POA: Diagnosis present

## 2019-11-05 DIAGNOSIS — I5042 Chronic combined systolic (congestive) and diastolic (congestive) heart failure: Secondary | ICD-10-CM | POA: Diagnosis present

## 2019-11-05 DIAGNOSIS — R569 Unspecified convulsions: Secondary | ICD-10-CM | POA: Diagnosis not present

## 2019-11-05 DIAGNOSIS — J449 Chronic obstructive pulmonary disease, unspecified: Secondary | ICD-10-CM | POA: Diagnosis present

## 2019-11-05 DIAGNOSIS — M5412 Radiculopathy, cervical region: Secondary | ICD-10-CM | POA: Diagnosis present

## 2019-11-05 DIAGNOSIS — E1142 Type 2 diabetes mellitus with diabetic polyneuropathy: Secondary | ICD-10-CM | POA: Diagnosis present

## 2019-11-05 DIAGNOSIS — R579 Shock, unspecified: Secondary | ICD-10-CM | POA: Diagnosis not present

## 2019-11-05 DIAGNOSIS — I959 Hypotension, unspecified: Secondary | ICD-10-CM | POA: Diagnosis not present

## 2019-11-05 DIAGNOSIS — E785 Hyperlipidemia, unspecified: Secondary | ICD-10-CM | POA: Diagnosis present

## 2019-11-05 DIAGNOSIS — Z841 Family history of disorders of kidney and ureter: Secondary | ICD-10-CM

## 2019-11-05 DIAGNOSIS — K5730 Diverticulosis of large intestine without perforation or abscess without bleeding: Secondary | ICD-10-CM | POA: Diagnosis not present

## 2019-11-05 DIAGNOSIS — Z20822 Contact with and (suspected) exposure to covid-19: Secondary | ICD-10-CM | POA: Diagnosis present

## 2019-11-05 DIAGNOSIS — R911 Solitary pulmonary nodule: Secondary | ICD-10-CM | POA: Diagnosis present

## 2019-11-05 DIAGNOSIS — Z7982 Long term (current) use of aspirin: Secondary | ICD-10-CM

## 2019-11-05 DIAGNOSIS — Z4682 Encounter for fitting and adjustment of non-vascular catheter: Secondary | ICD-10-CM | POA: Diagnosis not present

## 2019-11-05 DIAGNOSIS — F0390 Unspecified dementia without behavioral disturbance: Secondary | ICD-10-CM | POA: Diagnosis present

## 2019-11-05 DIAGNOSIS — Z881 Allergy status to other antibiotic agents status: Secondary | ICD-10-CM

## 2019-11-05 DIAGNOSIS — Z923 Personal history of irradiation: Secondary | ICD-10-CM

## 2019-11-05 DIAGNOSIS — E861 Hypovolemia: Secondary | ICD-10-CM | POA: Diagnosis present

## 2019-11-05 DIAGNOSIS — I252 Old myocardial infarction: Secondary | ICD-10-CM

## 2019-11-05 DIAGNOSIS — N186 End stage renal disease: Secondary | ICD-10-CM | POA: Diagnosis present

## 2019-11-05 DIAGNOSIS — R6521 Severe sepsis with septic shock: Secondary | ICD-10-CM | POA: Diagnosis present

## 2019-11-05 DIAGNOSIS — Z87891 Personal history of nicotine dependence: Secondary | ICD-10-CM

## 2019-11-05 DIAGNOSIS — J9690 Respiratory failure, unspecified, unspecified whether with hypoxia or hypercapnia: Secondary | ICD-10-CM

## 2019-11-05 DIAGNOSIS — R932 Abnormal findings on diagnostic imaging of liver and biliary tract: Secondary | ICD-10-CM | POA: Diagnosis not present

## 2019-11-05 DIAGNOSIS — E11649 Type 2 diabetes mellitus with hypoglycemia without coma: Secondary | ICD-10-CM | POA: Diagnosis not present

## 2019-11-05 DIAGNOSIS — R1084 Generalized abdominal pain: Secondary | ICD-10-CM | POA: Diagnosis not present

## 2019-11-05 DIAGNOSIS — Z833 Family history of diabetes mellitus: Secondary | ICD-10-CM

## 2019-11-05 DIAGNOSIS — Z66 Do not resuscitate: Secondary | ICD-10-CM | POA: Diagnosis not present

## 2019-11-05 DIAGNOSIS — Z888 Allergy status to other drugs, medicaments and biological substances status: Secondary | ICD-10-CM

## 2019-11-05 DIAGNOSIS — K8050 Calculus of bile duct without cholangitis or cholecystitis without obstruction: Secondary | ICD-10-CM | POA: Diagnosis not present

## 2019-11-05 DIAGNOSIS — I120 Hypertensive chronic kidney disease with stage 5 chronic kidney disease or end stage renal disease: Secondary | ICD-10-CM | POA: Diagnosis not present

## 2019-11-05 DIAGNOSIS — I255 Ischemic cardiomyopathy: Secondary | ICD-10-CM | POA: Diagnosis present

## 2019-11-05 DIAGNOSIS — I700 Atherosclerosis of aorta: Secondary | ICD-10-CM | POA: Diagnosis not present

## 2019-11-05 DIAGNOSIS — K219 Gastro-esophageal reflux disease without esophagitis: Secondary | ICD-10-CM | POA: Diagnosis present

## 2019-11-05 DIAGNOSIS — Z978 Presence of other specified devices: Secondary | ICD-10-CM

## 2019-11-05 DIAGNOSIS — R109 Unspecified abdominal pain: Secondary | ICD-10-CM | POA: Diagnosis not present

## 2019-11-05 DIAGNOSIS — J9811 Atelectasis: Secondary | ICD-10-CM | POA: Diagnosis not present

## 2019-11-05 DIAGNOSIS — Z8249 Family history of ischemic heart disease and other diseases of the circulatory system: Secondary | ICD-10-CM

## 2019-11-05 DIAGNOSIS — Z9911 Dependence on respirator [ventilator] status: Secondary | ICD-10-CM | POA: Diagnosis not present

## 2019-11-05 DIAGNOSIS — G9340 Encephalopathy, unspecified: Secondary | ICD-10-CM | POA: Diagnosis not present

## 2019-11-05 DIAGNOSIS — K838 Other specified diseases of biliary tract: Secondary | ICD-10-CM | POA: Diagnosis present

## 2019-11-05 DIAGNOSIS — Z9861 Coronary angioplasty status: Secondary | ICD-10-CM

## 2019-11-05 DIAGNOSIS — Z8546 Personal history of malignant neoplasm of prostate: Secondary | ICD-10-CM

## 2019-11-05 DIAGNOSIS — I5082 Biventricular heart failure: Secondary | ICD-10-CM | POA: Diagnosis present

## 2019-11-05 LAB — COMPREHENSIVE METABOLIC PANEL
ALT: 231 U/L — ABNORMAL HIGH (ref 0–44)
AST: 200 U/L — ABNORMAL HIGH (ref 15–41)
Albumin: 3.3 g/dL — ABNORMAL LOW (ref 3.5–5.0)
Alkaline Phosphatase: 271 U/L — ABNORMAL HIGH (ref 38–126)
Anion gap: 17 — ABNORMAL HIGH (ref 5–15)
BUN: 43 mg/dL — ABNORMAL HIGH (ref 8–23)
CO2: 29 mmol/L (ref 22–32)
Calcium: 8.8 mg/dL — ABNORMAL LOW (ref 8.9–10.3)
Chloride: 95 mmol/L — ABNORMAL LOW (ref 98–111)
Creatinine, Ser: 8.73 mg/dL — ABNORMAL HIGH (ref 0.61–1.24)
GFR calc Af Amer: 6 mL/min — ABNORMAL LOW (ref >60)
GFR calc non Af Amer: 6 mL/min — ABNORMAL LOW (ref >60)
Glucose, Bld: 213 mg/dL — ABNORMAL HIGH (ref 70–99)
Potassium: 4.2 mmol/L (ref 3.5–5.1)
Sodium: 141 mmol/L (ref 135–145)
Total Bilirubin: 3.2 mg/dL — ABNORMAL HIGH (ref 0.3–1.2)
Total Protein: 7.7 g/dL (ref 6.5–8.1)

## 2019-11-05 LAB — CBC WITH DIFFERENTIAL/PLATELET
Abs Immature Granulocytes: 0.04 10*3/uL (ref 0.00–0.07)
Basophils Absolute: 0 10*3/uL (ref 0.0–0.1)
Basophils Relative: 0 %
Eosinophils Absolute: 0 10*3/uL (ref 0.0–0.5)
Eosinophils Relative: 0 %
HCT: 37 % — ABNORMAL LOW (ref 39.0–52.0)
Hemoglobin: 11.2 g/dL — ABNORMAL LOW (ref 13.0–17.0)
Immature Granulocytes: 1 %
Lymphocytes Relative: 4 %
Lymphs Abs: 0.3 10*3/uL — ABNORMAL LOW (ref 0.7–4.0)
MCH: 29.9 pg (ref 26.0–34.0)
MCHC: 30.3 g/dL (ref 30.0–36.0)
MCV: 98.9 fL (ref 80.0–100.0)
Monocytes Absolute: 0.3 10*3/uL (ref 0.1–1.0)
Monocytes Relative: 4 %
Neutro Abs: 7.1 10*3/uL (ref 1.7–7.7)
Neutrophils Relative %: 91 %
Platelets: 303 10*3/uL (ref 150–400)
RBC: 3.74 MIL/uL — ABNORMAL LOW (ref 4.22–5.81)
RDW: 18.6 % — ABNORMAL HIGH (ref 11.5–15.5)
WBC: 7.7 10*3/uL (ref 4.0–10.5)
nRBC: 0 % (ref 0.0–0.2)

## 2019-11-05 LAB — TYPE AND SCREEN
ABO/RH(D): O POS
Antibody Screen: NEGATIVE

## 2019-11-05 LAB — RESPIRATORY PANEL BY RT PCR (FLU A&B, COVID)
Influenza A by PCR: NEGATIVE
Influenza B by PCR: NEGATIVE
SARS Coronavirus 2 by RT PCR: NEGATIVE

## 2019-11-05 LAB — GLUCOSE, CAPILLARY: Glucose-Capillary: 194 mg/dL — ABNORMAL HIGH (ref 70–99)

## 2019-11-05 LAB — LACTIC ACID, PLASMA
Lactic Acid, Venous: 2.4 mmol/L (ref 0.5–1.9)
Lactic Acid, Venous: 3 mmol/L (ref 0.5–1.9)

## 2019-11-05 LAB — PROTIME-INR
INR: 1 (ref 0.8–1.2)
Prothrombin Time: 13.4 seconds (ref 11.4–15.2)

## 2019-11-05 LAB — LIPASE, BLOOD: Lipase: 34 U/L (ref 11–51)

## 2019-11-05 MED ORDER — METHIMAZOLE 5 MG PO TABS
2.5000 mg | ORAL_TABLET | Freq: Every day | ORAL | Status: DC
  Filled 2019-11-05 (×2): qty 1

## 2019-11-05 MED ORDER — ACETAMINOPHEN 650 MG RE SUPP: 650.0000 mg | Freq: Four times a day (QID) | RECTAL | Status: DC | PRN

## 2019-11-05 MED ORDER — PIPERACILLIN-TAZOBACTAM 3.375 G IVPB 30 MIN
3.3750 g | Freq: Once | INTRAVENOUS | Status: AC
  Administered 2019-11-05: 22:00:00 3.375 g via INTRAVENOUS
  Filled 2019-11-05: qty 50

## 2019-11-05 MED ORDER — ONDANSETRON HCL 4 MG PO TABS: 4.0000 mg | ORAL_TABLET | Freq: Four times a day (QID) | ORAL | Status: DC | PRN

## 2019-11-05 MED ORDER — HYDROMORPHONE HCL 1 MG/ML IJ SOLN
1.0000 mg | Freq: Once | INTRAMUSCULAR | Status: AC
  Administered 2019-11-05: 21:00:00 1 mg via INTRAVENOUS
  Filled 2019-11-05: qty 1

## 2019-11-05 MED ORDER — FENTANYL CITRATE (PF) 100 MCG/2ML IJ SOLN
50.0000 ug | Freq: Once | INTRAMUSCULAR | Status: AC
  Administered 2019-11-05: 50 ug via INTRAVENOUS
  Filled 2019-11-05: qty 2

## 2019-11-05 MED ORDER — FENTANYL CITRATE (PF) 100 MCG/2ML IJ SOLN: 25.0000 ug | INTRAMUSCULAR | Status: DC | PRN

## 2019-11-05 MED ORDER — FENTANYL CITRATE (PF) 100 MCG/2ML IJ SOLN
50.0000 ug | INTRAMUSCULAR | Status: DC | PRN
  Administered 2019-11-05 (×2): 50 ug via INTRAVENOUS
  Filled 2019-11-05 (×2): qty 2

## 2019-11-05 MED ORDER — ONDANSETRON HCL 4 MG/2ML IJ SOLN: 4.0000 mg | Freq: Four times a day (QID) | INTRAMUSCULAR | Status: DC | PRN

## 2019-11-05 MED ORDER — IOHEXOL 350 MG/ML SOLN
100.0000 mL | Freq: Once | INTRAVENOUS | Status: AC | PRN
  Administered 2019-11-05: 100 mL via INTRAVENOUS

## 2019-11-05 MED ORDER — PIPERACILLIN-TAZOBACTAM IN DEX 2-0.25 GM/50ML IV SOLN
2.2500 g | Freq: Three times a day (TID) | INTRAVENOUS | Status: DC
  Administered 2019-11-06 (×2): 2.25 g via INTRAVENOUS
  Filled 2019-11-05 (×5): qty 50

## 2019-11-05 MED ORDER — ACETAMINOPHEN 325 MG PO TABS: 650.0000 mg | ORAL_TABLET | Freq: Four times a day (QID) | ORAL | Status: DC | PRN

## 2019-11-05 MED ORDER — SODIUM CHLORIDE 0.9 % IV BOLUS
250.0000 mL | Freq: Once | INTRAVENOUS | Status: AC
  Administered 2019-11-05: 250 mL via INTRAVENOUS

## 2019-11-05 NOTE — Progress Notes (Signed)
Pharmacy Antibiotic Note  [name] is a 69 y.o. male admitted on 11/09/2019 with abdominal pain/fever hx of recurrent biliary obstruction, concern for intra-abdominal infection.  Pharmacy has been consulted for Zosyn dosing. Hx of ESRD - HD (TTS).  WBC wnl, AF here, LA 2.4.    Plan: Zosyn 2.25 g IV every 8 hours Monitor HD schedule, ERCP and GI recs, LOT     Temp (24hrs), Avg:99.5 F (37.5 C), Min:99.5 F (37.5 C), Max:99.5 F (37.5 C)  Recent Labs  Lab 11/09/2019 1700  WBC 7.7  CREATININE 8.73*  LATICACIDVEN 2.4*    CrCl cannot be calculated (Unknown ideal weight.).    Allergies  Allergen Reactions  . Statins Other (See Comments)    Weak muscles  . Ciprofloxacin Rash    Bertis Ruddy, PharmD Clinical Pharmacist Please check AMION for all [place] numbers 11/20/2019 10:05 PM

## 2019-11-05 NOTE — ED Provider Notes (Signed)
[facility] Provider Note   CSN: [id number] Arrival date & time: 11/19/2019  1406     History Chief Complaint  Patient presents with  . Abdominal Pain  . Hypotension    RA PFIESTER is a 69 y.o. male.  HPI Patient is sent from dialysis.  He reports he started having abdominal pain yesterday.  He describes it as fairly sudden in onset yesterday but does not specify what time.  He reports pain is generalized.  He does not endorse upper greater than lower.  He cannot really describe the quality.  Patient denies he had any vomiting, diarrhea or constipation.  He is very uncomfortable and at this point time a fairly vague historian.  He denies history of similar pain.  Patient was at dialysis and experiencing pain.  He became hypotensive down to 72/54.  Upon EMS arrival patient's blood pressure was 100/72.  Most recent just prior to ED arrival was 108/62.  Per EMS, patient also had temperature of 100.6.    Past Medical History:  Diagnosis Date  . Acute encephalopathy 10/04/2018  . Allergic rhinitis, cause unspecified 02/24/2014  . Anemia 06/16/2011  . BENIGN PROSTATIC HYPERTROPHY 10/14/2009  . CAD, NATIVE VESSEL 02/06/2009   a. 06/2007 s/p Taxus DES to the RCA;  b. 08/2016 NSTEMI in setting of SVT/PCI: LM 30ost, LAD 42m (3.0x16 Synergy DES), LCX 46m, OM1 60, RI 40, RCA 70p/m, 51m - not amenable to PCI.  [name] Cervical radiculopathy, chronic 02/23/2016   Right c5-6 by NCS/EMG  . Chest pain 08/09/2015  . Chronic combined systolic (congestive) and diastolic (congestive) heart failure ([place])    a. 10/2016 Echo: EF 40-45%, Gr2 DD. mildly dil LA.  [name] COLONIC POLYPS, HX OF 10/14/2009  . Dementia ([place]) [id number]  . Depression 09/24/2015  . DIABETES MELLITUS, TYPE II 02/01/2010  . DYSLIPIDEMIA 06/18/2007  . ESRD (end stage renal disease) on dialysis ([place]) 08/04/2010   ESRD due to DM adn HTN, started HD in 2011. As of 2019 has a R arm graft and gets HD on TTS sched  . FOOT  PAIN 08/12/2008  . GAIT DISTURBANCE 03/03/2010  . GASTROENTERITIS, VIRAL 10/14/2009  . GERD 06/18/2007  . GOITER, MULTINODULAR 12/26/2007  . GOUT 06/18/2007  . GYNECOMASTIA 07/17/2010  . Hemodialysis access, fistula [facility])    Dialysis T-Th-Sa ([place]) Right upper arm fistula  . Hyperlipidemia 10/16/2011  . Hyperparathyroidism, secondary ([place]) 06/16/2011  . HYPERTENSION 06/18/2007  . Hyperthyroidism   . Hypocalcemia 06/07/2010  . Ischemic cardiomyopathy    a. 10/2016 Echo: EF 40-45%.  . Lumbar stenosis with neurogenic claudication   . ONYCHOMYCOSIS, TOENAILS 12/26/2007  . OSA on CPAP 10/16/2011  . Other malaise and fatigue 11/24/2009  . PERIPHERAL NEUROPATHY 06/18/2007  . Prostate cancer ([place])   . PSVT (paroxysmal supraventricular tachycardia) ([place])    a. 03/2375 complicated by NSTEMI;  b. 11/2016 Treated w/ adenosine in ED;  c. 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  [name] PULMONARY NODULE, RIGHT LOWER LOBE 06/08/2009  . Sleep apnea    cpap machine and o2  . TRANSAMINASES, SERUM, ELEVATED 02/01/2010  . Transfusion history    none recent  . Unspecified hypotension 01/30/2010    Patient Active Problem List   Diagnosis Date Noted  . Atrial flutter ([place]) 07/04/2019  . Bilateral leg pain 07/04/2019  . RUQ pain 03/07/2019  . Common bile duct stone   . Dialysis AV fistula malfunction ([place])   . Recurrent Clostridioides difficile  diarrhea   . Septic shock ([place]) 11/11/2018  . Cerebral embolism with cerebral infarction 10/05/2018  . Acute hepatitis   . Left wrist pain 09/03/2018  . C. difficile colitis 09/03/2018  . Altered mental status   . Bacteremia   . Acute gouty arthritis 08/08/2018  . Sepsis ([place]) 07/16/2018  . Thrush 06/21/2018  . GI bleed 06/11/2018  . Recurrent falls 05/14/2018  . Chronic combined systolic (congestive) and diastolic (congestive) heart failure ([place]) 01/15/2018  . Abscess of left leg 10/14/2017  . Insomnia 10/14/2017  . Lumbar stenosis with neurogenic  claudication 07/31/2017  . Lightheadedness   . Rectal bleeding   . Gait disorder 05/05/2017  . Chronic GI bleeding 04/22/2017  . Malaise and fatigue 12/27/2016  . Generalized weakness 12/27/2016  . History of PSVT (paroxysmal supraventricular tachycardia) 10/16/2016  . Abdominal pain 08/27/2016  . Abnormal findings on diagnostic imaging of liver and biliary tract 08/27/2016  . Bloating symptom 08/27/2016  . Hematochezia 08/27/2016  . Esophageal ulcer 08/27/2016  . Flatulence, eructation and gas pain 08/27/2016  . General weakness 08/27/2016  . Skin sensation disturbance 08/27/2016  . Stricture of esophagus 08/27/2016  . Status post coronary artery stent placement   . ST segment depression 08/05/2016  . PAD (peripheral artery disease) ([place]) 06/18/2016  . Cervical radiculopathy, chronic 02/23/2016  . Memory loss 12/22/2015  . Left-sided weakness 12/22/2015  . Peripheral polyneuropathy 12/22/2015  . Nausea and vomiting 10/31/2015  . Malignant neoplasm of prostate ([place]) 10/24/2015  . S/P right total knee replacement 08/09/2015  . Primary osteoarthritis of right knee 08/05/2015  . Diabetes ([place]) 08/05/2015  . Hyperthyroidism 03/11/2015  . Nodule of right lung 09/14/2014  . PSA elevation 08/30/2014  . ESRD on hemodialysis ([place]) 08/16/2014  . Essential hypertension 08/16/2014  . Encounter for hemodialysis ([place]) 08/16/2014  . Allergic rhinitis 02/24/2014  . Vertigo 10/14/2013  . Cough 06/26/2013  . Numbness 05/15/2013  . Other complications due to renal dialysis device, implant, and graft 01/14/2013  . Right hand pain 03/28/2012  . Dysphagia 03/28/2012  . Hypogonadism, male 03/28/2012  . OSA (obstructive sleep apnea) 10/16/2011  . HLD (hyperlipidemia) 10/16/2011  . CAD S/P percutaneous coronary angioplasty 09/28/2011  . NSTEMI (non-ST elevated myocardial infarction) ([place]) 09/28/2011  . AVM (arteriovenous malformation) 09/27/2011  . Anemia associated with acute blood loss  09/26/2011  . Ischemic cardiomyopathy 06/16/2011  . Hyperparathyroidism, secondary ([place]) 06/16/2011  . Preventative health care 03/11/2011  . NECK PAIN 07/31/2010  . GYNECOMASTIA 07/17/2010  . DIZZINESS 07/17/2010  . GAIT DISTURBANCE 03/03/2010  . Thyrotoxicosis 02/02/2010  . Elevated levels of transaminase & lactic acid dehydrogenase 02/01/2010  . Other malaise and fatigue 11/24/2009  . Depression 10/14/2009  . BENIGN PROSTATIC HYPERTROPHY 10/14/2009  . COLONIC POLYPS, HX OF 10/14/2009  . Dementia ([place]) 09/02/2009  . PULMONARY NODULE, RIGHT LOWER LOBE 06/08/2009  . DYSPNEA 10/29/2008  . ONYCHOMYCOSIS, TOENAILS 12/26/2007  . GOITER, MULTINODULAR 12/26/2007  . Gout 06/18/2007  . Neuropathy ([place]) 06/18/2007  . Hypertensive heart disease with CHF (congestive heart failure) ([place]) 06/18/2007  . Gastro-esophageal reflux disease without esophagitis 06/18/2007    Past Surgical History:  Procedure Laterality Date  . A/V SHUNTOGRAM N/A 04/07/2018   Procedure: A/V SHUNTOGRAM;  Surgeon: Waynetta Sandy, MD;  Location: [facility] CV LAB;  Service: Cardiovascular;  Laterality: N/A;  . ARTERIOVENOUS GRAFT PLACEMENT Right 2009   forearm/notes 02/01/2011  . AV FISTULA PLACEMENT  11/07/2011   Procedure: INSERTION OF ARTERIOVENOUS (AV) GORE-TEX GRAFT ARM;  Surgeon: Tinnie Gens, MD;  Location: [facility];  Service: Vascular;  Laterality: Left;  . BACK SURGERY  1998  . BALLOON DILATION N/A 11/14/2018   Procedure: BALLOON DILATION;  Surgeon: Ronnette Juniper, MD;  Location: [facility];  Service: Gastroenterology;  Laterality: N/A;  . BASCILIC VEIN TRANSPOSITION Right 02/27/2013   Procedure: BASCILIC VEIN TRANSPOSITION;  Surgeon: Mal Misty, MD;  Location: [facility];  Service: Vascular;  Laterality: Right;  Right Basilic Vein Transposition   . BILIARY DILATION  03/31/2019   Procedure: BILIARY DILATION;  Surgeon: Clarene Essex, MD;  Location: [facility] ENDOSCOPY;  Service: Endoscopy;;  . BILIARY DILATION   05/20/2019   Procedure: BILIARY DILATION;  Surgeon: Clarene Essex, MD;  Location: [facility] ENDOSCOPY;  Service: Endoscopy;;  . BILIARY STENT PLACEMENT  03/31/2019   Procedure: BILIARY STENT PLACEMENT;  Surgeon: Clarene Essex, MD;  Location: [facility] ENDOSCOPY;  Service: Endoscopy;;  . BILIARY STENT PLACEMENT N/A 05/20/2019   Procedure: BILIARY STENT PLACEMENT;  Surgeon: Clarene Essex, MD;  [facility] ENDOSCOPY;  Service: Endoscopy;  Laterality: N/A;  . BILIARY STENT PLACEMENT  08/03/2019   Procedure: BILIARY STENT PLACEMENT;  Surgeon: Arta Silence, MD;  Location: [facility];  Service: Endoscopy;;  . CARDIAC CATHETERIZATION N/A 08/06/2016   Procedure: Left Heart Cath and Coronary Angiography;  Surgeon: Jolaine Artist, MD;  Location: [facility] CV LAB;  Service: Cardiovascular;  Laterality: N/A;  . CARDIAC CATHETERIZATION N/A 08/07/2016   Procedure: Coronary/Graft Atherectomy-CSI LAD;  Surgeon: Peter M Martinique, MD;  Location: [facility] CV LAB;  Service: Cardiovascular;  Laterality: N/A;  . CERVICAL SPINE SURGERY  2/09   "to repair nerve problems in my left arm"  . CHOLECYSTECTOMY    . COLONOSCOPY WITH PROPOFOL N/A 04/26/2017   Procedure: COLONOSCOPY WITH PROPOFOL;  Surgeon: Otis Brace, MD;  Location: [facility];  Service: Gastroenterology;  Laterality: N/A;  . CORONARY ANGIOPLASTY WITH STENT PLACEMENT  06/11/2008  . CORONARY ANGIOPLASTY WITH STENT PLACEMENT  06/2007   TAXUS stent to RCA/notes 01/31/2011  . ENDOSCOPIC RETROGRADE CHOLANGIOPANCREATOGRAPHY (ERCP) WITH PROPOFOL N/A 03/31/2019   Procedure: ENDOSCOPIC RETROGRADE CHOLANGIOPANCREATOGRAPHY (ERCP) WITH PROPOFOL;  Surgeon: Clarene Essex, MD;  Location: [facility];  Service: Endoscopy;  Laterality: N/A;  . ERCP N/A 11/14/2018   Procedure: ENDOSCOPIC RETROGRADE CHOLANGIOPANCREATOGRAPHY (ERCP);  Surgeon: Ronnette Juniper, MD;  Location: [facility];  Service: Gastroenterology;  Laterality: N/A;  . ERCP N/A 05/20/2019   Procedure: ENDOSCOPIC RETROGRADE  CHOLANGIOPANCREATOGRAPHY (ERCP);  Surgeon: Clarene Essex, MD;  [facility];  Service: Endoscopy;  Laterality: N/A;  . ERCP N/A 08/03/2019   Procedure: ENDOSCOPIC RETROGRADE CHOLANGIOPANCREATOGRAPHY (ERCP);  Surgeon: Arta Silence, MD;  Location: [facility] ENDOSCOPY;  Service: Endoscopy;  Laterality: N/A;  . ERCP N/A 09/07/2019   Procedure: ENDOSCOPIC RETROGRADE CHOLANGIOPANCREATOGRAPHY (ERCP);  Surgeon: Clarene Essex, MD;  Location: [facility];  Service: Endoscopy;  Laterality: N/A;  . ESOPHAGOGASTRODUODENOSCOPY  09/28/2011   Procedure: ESOPHAGOGASTRODUODENOSCOPY (EGD);  Surgeon: Missy Sabins, MD;  Location: [facility] ENDOSCOPY;  Service: Endoscopy;  Laterality: N/A;  . ESOPHAGOGASTRODUODENOSCOPY N/A 04/07/2015   Procedure: ESOPHAGOGASTRODUODENOSCOPY (EGD);  Surgeon: Teena Irani, MD;  Location: [facility];  Service: Endoscopy;  Laterality: N/A;  . ESOPHAGOGASTRODUODENOSCOPY N/A 04/19/2015   Procedure: ESOPHAGOGASTRODUODENOSCOPY (EGD);  Surgeon: Arta Silence, MD;  Location: [facility] ENDOSCOPY;  Service: Endoscopy;  Laterality: N/A;  . ESOPHAGOGASTRODUODENOSCOPY  11/14/2018   Procedure: ESOPHAGOGASTRODUODENOSCOPY (EGD);  Surgeon: Ronnette Juniper, MD;  Location: [facility] ENDOSCOPY;  Service: Gastroenterology;;  . ESOPHAGOGASTRODUODENOSCOPY (EGD) WITH PROPOFOL N/A 06/13/2018   Procedure: ESOPHAGOGASTRODUODENOSCOPY (EGD) WITH PROPOFOL;  Surgeon: Wonda Horner, MD;  Location: [facility] ENDOSCOPY;  Service: Endoscopy;  Laterality: N/A;  . FLEXIBLE SIGMOIDOSCOPY N/A 05/21/2017   Procedure: Conni Elliot;  Surgeon: Clarene Essex, MD;  Location: [name];  Service: Endoscopy;  Laterality: N/A;  . FLEXIBLE SIGMOIDOSCOPY Left 07/02/2017   Procedure: FLEXIBLE SIGMOIDOSCOPY;  Surgeon: Laurence Spates, MD;  Location: [facility];  Service: Endoscopy;  Laterality: Left;  . FOREIGN BODY REMOVAL  09/2003   via upper endoscopy/notes 02/12/2011  . GIVENS CAPSULE STUDY  09/30/2011   Procedure: GIVENS CAPSULE STUDY;  Surgeon: Jeryl Columbia, MD;  Location: [facility] ENDOSCOPY;  Service: Endoscopy;  Laterality: N/A;  . INSERTION OF DIALYSIS CATHETER Right 2014  . INSERTION OF DIALYSIS CATHETER Left 02/11/2013   Procedure: INSERTION OF DIALYSIS CATHETER;  Surgeon: Conrad Colquitt, MD;  Location: [facility];  Service: Vascular;  Laterality: Left;  Ultrasound guided  . IR AV DIALY SHUNT INTRO NEEDLE/INTRACATH INITIAL W/PTA/IMG RIGHT Right 11/14/2018  . IR US GUIDE VASC ACCESS RIGHT  11/14/2018  . LUMBAR LAMINECTOMY/DECOMPRESSION MICRODISCECTOMY Bilateral 07/31/2017   Procedure: LAMINECTOMY AND FORAMINOTOMY- BILATERAL LUMBAR TWO- LUMBAR THREE;  Surgeon: Earnie Larsson, MD;  Location: [facility];  Service: Neurosurgery;  Laterality: Bilateral;  LAMINECTOMY AND FORAMINOTOMY- BILATERAL LUMBAR 2- LUMBAR 3  . PERIPHERAL VASCULAR BALLOON ANGIOPLASTY  04/07/2018   Procedure: PERIPHERAL VASCULAR BALLOON ANGIOPLASTY;  Surgeon: Waynetta Sandy, MD;  Location: [facility] CV LAB;  Service: Cardiovascular;;  RUE AVF  . REMOVAL OF A DIALYSIS CATHETER Right 02/11/2013   Procedure: REMOVAL OF A DIALYSIS CATHETER;  Surgeon: Conrad [place], MD;  Location: [facility];  Service: Vascular;  Laterality: Right;  . REMOVAL OF STONES  11/14/2018   Procedure: REMOVAL OF STONES;  Surgeon: Ronnette Juniper, MD;  Location: [facility] ENDOSCOPY;  Service: Gastroenterology;;  . REMOVAL OF STONES  03/31/2019   Procedure: REMOVAL OF STONES;  Surgeon: Clarene Essex, MD;  Location: [place];  Service: Endoscopy;;  . REMOVAL OF STONES  08/03/2019   Procedure: REMOVAL OF STONES;  Surgeon: Arta Silence, MD;  Location: [facility] ENDOSCOPY;  Service: Endoscopy;;  . REMOVAL OF STONES  09/07/2019   Procedure: REMOVAL OF STONES;  Surgeon: Clarene Essex, MD;  [facility] ENDOSCOPY;  Service: Endoscopy;;  . Azzie Almas DILATION N/A 04/07/2015   Procedure: Azzie Almas DILATION;  Surgeon: Teena Irani, MD;  [facility] ENDOSCOPY;  Service: Endoscopy;  Laterality: N/A;  . SHUNTOGRAM N/A 09/20/2011   Procedure: Earney Mallet;   Surgeon: Conrad [place], MD;  Location: [facility] CATH LAB;  Service: Cardiovascular;  Laterality: N/A;  . SPHINCTEROTOMY  11/14/2018   Procedure: SPHINCTEROTOMY;  Surgeon: Ronnette Juniper, MD;  Location: [facility] ENDOSCOPY;  Service: Gastroenterology;;  . Joan Mayans  03/31/2019   Procedure: SPHINCTEROTOMY;  Surgeon: Clarene Essex, MD;  Location: [facility] ENDOSCOPY;  Service: Endoscopy;;  . SPHINCTEROTOMY  05/20/2019   Procedure: Joan Mayans;  Surgeon: Clarene Essex, MD;  [facility]OS[facility];  Service: Endoscopy;;  . Joan Mayans  09/07/2019   Procedure: SPHINCTEROTOMY;  Surgeon: Clarene Essex, MD;  [facility] ENDOS[facility];  Service: Endoscopy;;  . SPYGLASS CHOLANGIOSCOPY N/A 05/20/2019   Procedure: YPPJKDTO CHOLANGIOSCOPY;  Surgeon: Clarene Essex, MD;  [facility] ENDOS[facility];  Service: Endoscopy;  Laterality: N/A;  Bess Kinds LITHOTRIPSY N/A 05/20/2019   Procedure: IZTIWPYK LITHOTRIPSY;  Surgeon: Clarene Essex, MD;  [facility]OS[facility];  Service: Endoscopy;  Laterality: N/A;  . STENT REMOVAL  05/20/2019   Procedure: STENT REMOVAL;  Surgeon: Clarene Essex, MD;  [facility];  Service: Endoscopy;;  . STENT REMOVAL  08/03/2019   Procedure: STENT REMOVAL;  Surgeon: Arta Silence, MD;  Location: [facility] ENDOSCOPY;  Service: Endoscopy;;  . STENT REMOVAL  09/07/2019   Procedure: STENT REMOVAL;  Surgeon: Clarene Essex, MD;  [facility];  Service: Endoscopy;;  . SVT ABLATION N/A 11/26/2016   Procedure: SVT Ablation;  Surgeon: Will Meredith Leeds, MD;  Location: [place] CV LAB;  Service: Cardiovascular;  Laterality: N/A;  . TEE WITHOUT CARDIOVERSION N/A 08/25/2018   Procedure: TRANSESOPHAGEAL ECHOCARDIOGRAM (TEE);  Surgeon: Dorothy Spark, MD;  Location: [place];  Service: Cardiovascular;  Laterality: N/A;  . TEE WITHOUT CARDIOVERSION N/A 04/02/2019   Procedure: TRANSESOPHAGEAL ECHOCARDIOGRAM (TEE);  Surgeon: Josue Hector, MD;  Location: [facility] ENDOSCOPY;  Service: Cardiovascular;  Laterality: N/A;  .  TONSILLECTOMY    . TOTAL KNEE ARTHROPLASTY Right 08/02/2015   Procedure: TOTAL KNEE ARTHROPLASTY;  Surgeon: Renette Butters, MD;  Location: [place];  Service: Orthopedics;  Laterality: Right;  . VENOGRAM N/A 01/26/2013   Procedure: VENOGRAM;  Surgeon: Angelia Mould, MD;  Location: [facility] CATH LAB;  Service: Cardiovascular;  Laterality: N/A;       Family History  Problem Relation Age of Onset  . Heart disease Sister   . Thyroid nodules Sister   . Heart disease Father   . Diabetes Father   . Kidney failure Father   . Hypertension Father   . Healthy Child   . Healthy Child   . Healthy Child   . Cancer Neg Hx     Social History   Tobacco Use  . Smoking status: Former Smoker    Packs/day: 1.00    Years: 25.00    Pack years: 25.00    Types: Cigarettes    Quit date: 10/01/2005    Years since quitting: 14.1  . Smokeless tobacco: Former Systems developer    Quit date: 10/01/2005  . Tobacco comment: Quit smoking 2007 Smoked x 25 years 1/2 ppd.  Substance Use Topics  . Alcohol use: No    Alcohol/week: 0.0 standard drinks  . Drug use: No    Home Medications Prior to Admission medications   Medication Sig Start Date End Date Taking? Authorizing Provider  acetaminophen (TYLENOL) 325 MG tablet Take 325 mg by mouth every 6 (six) hours as needed for mild pain or headache.     [provider]  Alirocumab (PRALUENT) 150 MG/ML SOAJ Inject 1 pen into the skin every 14 (fourteen) days. 10/07/19   Camnitz, Ocie Doyne, MD  allopurinol (ZYLOPRIM) 100 MG tablet Take 1 tablet by mouth once daily 09/18/19   Biagio Borg, MD  aspirin EC 325 MG tablet Take 1 tablet (325 mg total) by mouth daily. 10/10/18   Hongalgi, Lenis Dickinson, MD  betamethasone dipropionate (DIPROLENE) 0.05 % cream Apply 1 application topically daily as needed (leg sores).    [provider]  bismuth subsalicylate (PEPTO BISMOL) 262 MG/15ML suspension Take 30 mLs by mouth every 6 (six) hours as needed for indigestion.    [provider]  cinacalcet (SENSIPAR) 90 MG tablet Take 90 mg by mouth 2 (two) times daily.    [provider]  citalopram (CELEXA) 10 MG tablet Take 1 tablet (10 mg total) by mouth daily. Patient taking differently: Take 10 mg by mouth every evening.  08/12/19 08/11/20  Biagio Borg, MD  colestipol (COLESTID) 1 g tablet Take 3 tablets (3 g total) by mouth daily. 02/18/19   Renato Shin, MD  doxercalciferol ([name]) 4 MCG/2ML injection Inject 1  mL (2 mcg total) into the vein Every Tuesday,Thursday,and Saturday with dialysis. 09/02/18   Kayleen Memos, DO  ezetimibe (ZETIA) 10 MG tablet Take 10 mg by mouth at bedtime.     [provider]  ferric citrate (AURYXIA) 1 GM 210 MG(Fe) tablet Take 420 mg by mouth 3 (three) times daily with meals.     [provider]  gabapentin (NEURONTIN) 300 MG capsule TAKE 1 TO 2 CAPSULES BY MOUTH AT BEDTIME AS NEEDED FOR PAIN AND FOR SLEEP 09/28/19   Biagio Borg, MD  hydroxypropyl methylcellulose / hypromellose (ISOPTO TEARS / GONIOVISC) 2.5 % ophthalmic solution Place 1 drop into both eyes 3 (three) times daily as needed for dry eyes. 05/03/17   Biagio Borg, MD  methimazole (TAPAZOLE) 5 MG tablet Take 1/2 (one-half) tablet by mouth once daily 10/15/19   Renato Shin, MD  midodrine (PROAMATINE) 10 MG tablet Take 10 mg by mouth every Thursday with hemodialysis.     [provider]  multivitamin (RENA-VIT) TABS tablet Take 1 tablet by mouth daily.     [provider]  pantoprazole (PROTONIX) 40 MG tablet Take 40 mg by mouth at bedtime.  11/03/18   [provider]  polyethylene glycol (MIRALAX / GLYCOLAX) 17 g packet Take 17 g by mouth daily as needed for mild constipation. 04/02/19   Aline August, MD  Vitamin D, Ergocalciferol, (DRISDOL) 1.25 MG (50000 UT) CAPS capsule Take 1 capsule (50,000 Units total) by mouth every 7 (seven) days. Patient taking differently: Take 50,000 Units by mouth every Wednesday.  08/13/19    Biagio Borg, MD  zolpidem (AMBIEN) 5 MG tablet Take 1 tablet (5 mg total) by mouth at bedtime as needed. for sleep Patient taking differently: Take 5 mg by mouth at bedtime as needed for sleep.  07/03/19   Biagio Borg, MD    Allergies    Statins and Ciprofloxacin  Review of Systems   Review of Systems Level 5 caveat at this time patient is poor historian, cannot obtain full review of systems. Physical Exam Updated Vital Signs BP 117/71   Pulse (!) 110   Temp 99.5 F (37.5 C) (Oral)   Resp (!) 21   SpO2 93%   Physical Exam Constitutional:      Comments: Patient is alert but appears very uncomfortable and is moaning.  No respiratory distress.  Skin is warm and dry.  Patient is not diaphoretic.  HENT:     Head: Normocephalic and atraumatic.  Eyes:     Extraocular Movements: Extraocular movements intact.  Cardiovascular:     Comments: Borderline tachycardia.  Regular.  No gross rub murmur gallop. Pulmonary:     Comments: No respiratory distress.  Lungs grossly clear.  No coughing. Abdominal:     Comments: Patient has central obesity.  Abdomen is soft.  There is no guarding.  He does not endorse any localizing pain with palpation or creation of intra-abdominal fluid wave.  Musculoskeletal:        General: No swelling or tenderness. Normal range of motion.     Comments: Patient is right upper arm fistula has a clamp on it.  It is not actively bleeding with the clamp in place.  Patient's left upper arm has old scars from fistula but no thrill and no dilated vessels.  The upper arm is soft and pliable.  Radial pulse palpable on the left upper extremity.  Skin:    General: Skin is warm and dry.  Neurological:     General: No focal deficit present.     Mental Status: He is oriented to person, place, and time.     Coordination: Coordination normal.     ED Results / Procedures / Treatments   Labs (all labs ordered are listed, but only abnormal results are displayed) Labs  Reviewed  COMPREHENSIVE METABOLIC PANEL - Abnormal; Notable for the following components:      Result Value   Chloride 95 (*)    Glucose, Bld 213 (*)    BUN 43 (*)    Creatinine, Ser 8.73 (*)    Calcium 8.8 (*)    Albumin 3.3 (*)    AST 200 (*)    ALT 231 (*)    Alkaline Phosphatase 271 (*)    Total Bilirubin 3.2 (*)    GFR calc non Af Amer 6 (*)    GFR calc Af Amer 6 (*)    Anion gap 17 (*)    All other components within normal limits  LACTIC ACID, PLASMA - Abnormal; Notable for the following components:   Lactic Acid, Venous 2.4 (*)    All other components within normal limits  CBC WITH DIFFERENTIAL/PLATELET - Abnormal; Notable for the following components:   RBC 3.74 (*)    Hemoglobin 11.2 (*)    HCT 37.0 (*)    RDW 18.6 (*)    Lymphs Abs 0.3 (*)    All other components within normal limits  LIPASE, BLOOD  PROTIME-INR  LACTIC ACID, PLASMA  TYPE AND SCREEN    EKG EKG Interpretation  Date/Time:  Thursday November 05 2019 16:18:02 EST Ventricular Rate:  113 PR Interval:    QRS Duration: 104 QT Interval:  318 QTC Calculation: 436 R Axis:   73 Text Interpretation: Sinus tachycardia Ventricular premature complex Borderline repolarization abnormality ST depression inferiorly more pronounced than previous but similar. Confirmed by Charlesetta Shanks [phone]) on 11/12/2019 6:11:47 PM   Radiology CT Angio Chest/Abd/Pel for Dissection W and/or W/WO  Result Date: 11/10/2019 CLINICAL DATA:  69 year old male with history of kidney disease and vascular disease. Evaluate for possible dissection. EXAM: CT ANGIOGRAPHY CHEST, ABDOMEN AND PELVIS TECHNIQUE: Multidetector CT imaging through the chest, abdomen and pelvis was performed using the standard protocol during bolus administration of intravenous contrast. Multiplanar reconstructed images and MIPs were obtained and reviewed to evaluate the vascular anatomy. CONTRAST:  131mL OMNIPAQUE IOHEXOL 350 MG/ML SOLN COMPARISON:  CT of the abdomen  pelvis dated 07/31/2019. FINDINGS: CTA CHEST FINDINGS Cardiovascular: There is no cardiomegaly or pericardial effusion. Advanced 3 vessel coronary vascular calcification noted. There is advanced calcified and noncalcified atherosclerotic plaque of the aorta. No aneurysmal dilatation or dissection. The origins of the great vessels of the aortic arch appear patent as visualized. Evaluation of the pulmonary arteries is limited due to streak artifact caused by patient's arms and suboptimal opacification and timing of the contrast. No large or central pulmonary artery embolus identified. There is a stent in the distal right subclavian vein. There is narrowing of the right innominate vein. Mediastinum/Nodes: There is no hilar or mediastinal adenopathy. The esophagus is grossly unremarkable. No mediastinal fluid collection. Lungs/Pleura: There are bibasilar subpleural densities, likely atelectasis/scarring. No focal consolidation, pleural effusion, or pneumothorax. There is a 5 mm right middle lobe subpleural nodule (series 6 image 39). Musculoskeletal: Multilevel degenerative changes of the spine. No acute osseous pathology. Partially visualized lower cervical ACDF. Review of the MIP images confirms the above findings. CTA ABDOMEN AND PELVIS FINDINGS VASCULAR Aorta:  Advanced atherosclerotic disease. No aneurysmal dilatation or dissection. There is a 2.3 cm infrarenal aortic ectasia. Celiac: There is atherosclerotic calcification of the origin of the celiac axis with approximately 50% narrowing. The celiac artery and its major branches remain patent. SMA: There is atherosclerotic calcification of the origin of the SMA. Noncalcified plaques noted in the wall of the proximal SMA. The SMA remains patent. Renals: Atherosclerotic calcification of the origins of the renal arteries. There is severely diminished flow within the renal arteries. IMA: The IMA is patent. Inflow: Advanced atherosclerotic calcification of the iliac  arteries. No aneurysmal dilatation or dissection. The iliac arteries are patent. Veins: No obvious venous abnormality within the limitations of this arterial phase study. Review of the MIP images confirms the above findings. NON-VASCULAR No intra-abdominal free air or free fluid. Hepatobiliary: Diffuse fatty infiltration of the liver. There is mild intrahepatic biliary ductal dilatation. Cholecystectomy. There is air within the cystic duct and central biliary trees. Small scattered linear air in the periphery of the liver most likely biliary air. Portal venous air is less likely. There may be sludge in the central CBD (series 7, image 168). Evaluation is very limited due to streak artifact caused by patient's arms. Pancreas: Unremarkable. No pancreatic ductal dilatation or surrounding inflammatory changes. Spleen: Normal in size without focal abnormality. Adrenals/Urinary Tract: The adrenal glands are unremarkable. Atrophic kidneys. Several bilateral renal hypodense lesions measuring up to 2.7 cm on the left. The larger lesions demonstrate fluid attenuation consistent with cysts and the smaller lesions are not characterized. There is no hydronephrosis on either side. Vascular calcifications noted. The visualized ureters are grossly unremarkable. The urinary bladder is collapsed. Stomach/Bowel: Scattered colonic diverticula without active inflammatory changes. There is no bowel obstruction or active inflammation. The appendix is normal. There is a small hiatal hernia. There is a 3.5 x 2.0 cm fatty ingested particle in the distal esophagus concerning for an impacted particle. Lymphatic: No adenopathy. Reproductive: The prostate and seminal vesicles are grossly unremarkable. Several surgical clips or fiducial markers noted in the region of the prostate gland. Other: None Musculoskeletal: Multilevel degenerative changes and lower lumbar fusion. No acute osseous pathology. Review of the MIP images confirms the above  findings. IMPRESSION: 1. No CT evidence of aortic aneurysm or dissection. Severe Aortic Atherosclerosis (ICD10-I70.0). 2. Ingested fatty particle in the distal esophagus concerning for a degree of impaction. Clinical correlation is recommended. 3. Cholecystectomy with dilatation of the common bile duct and intrahepatic biliary trees. Some sludge may be present in the distal CBD. 4. Pneumobilia. 5. Colonic diverticulosis. No bowel obstruction or active inflammation. Normal appendix. Electronically Signed   By: Anner Crete M.D.   On: 11/13/2019 20:15    Procedures Procedures (including critical care time) Angiocath insertion Performed by: Charlesetta Shanks  Consent: Verbal consent obtained. Risks and benefits: risks, benefits and alternatives were discussed Time out: Immediately prior to procedure a "time out" was called to verify the correct patient, procedure, equipment, support staff and site/side marked as required.  Preparation: Patient was prepped and draped in the usual sterile fashion.  Vein Location: left brachial  Yes Ultrasound Guided, visualized in the vein throughout insertion.  Gauge: 20  Normal blood return and flush without difficulty Patient tolerance: Patient tolerated the procedure well with no immediate complications.   Medications Ordered in ED Medications  fentaNYL (SUBLIMAZE) injection 50 mcg (50 mcg Intravenous Given 11/26/2019 1933)  piperacillin-tazobactam (ZOSYN) IVPB 3.375 g (has no administration in time range)  sodium chloride 0.9 %  bolus 250 mL (0 mLs Intravenous Stopped 11/29/2019 1816)  fentaNYL (SUBLIMAZE) injection 50 mcg (50 mcg Intravenous Given 11/25/2019 1720)  iohexol (OMNIPAQUE) 350 MG/ML injection 100 mL (100 mLs Intravenous Contrast Given 11/18/2019 1942)  HYDROmorphone (DILAUDID) injection 1 mg (1 mg Intravenous Given 11/26/2019 2107)    ED Course  I have reviewed the triage vital signs and the nursing notes.  Pertinent labs & imaging results that were  available during my care of the patient were reviewed by me and considered in my medical decision making (see chart for details).  Clinical Course as of Nov 04 2142  Thu Nov 05, 2019  2107 Nurse advises me that the patient has pulled his IV out.  Fortunately, he did get his CT completed.  No vascular complications.  Will have Dilaudid 1 mg IM administered.  Will need to restart IV.  Consultation placed for gastroenterology.   [MP]  2132 Consult: Reviewed with Dr. Cristina Gong for gastroenterology.  Advises to administer Zosyn and pain control.  Patient will be seen in consultation.   [MP]  2141 Consult: Reviewed with Dr. Hal Hope for admission.   [MP]    Clinical Course User Index [MP] Charlesetta Shanks, MD   MDM Rules/Calculators/A&P                     Patient presents with severe abdominal pain.  Pain seems to be very generalized and patient does not describe it as having started in one particular location.  He does not have guarding or firm abdominal distention.  However, patient is hypotensive and seems very uncomfortable.  We will proceed with CT dissection study.  Will give a small fluid bolus for blood pressure that trended down to the high 79X systolic and heart rate of 107.  Will give fentanyl for pain.  We will continue monitoring and diagnostic evaluation. Final Clinical Impression(s) / ED Diagnoses Final diagnoses:  Choledocholithiasis  ESRD (end stage renal disease) on dialysis ([place])  Severe comorbid illness    Rx / DC Orders ED Discharge Orders    None       Charlesetta Shanks, MD 11/09/19 1529

## 2019-11-05 NOTE — Progress Notes (Addendum)
   We have been asked to see this 69 y/o dialysis pt re. abd pain and elevated LFT's in a pt w/ h/o recurrent biliary obstrn/sepsis due to stones, s/p multiple ERCP's by Dr. Janne Napoleon recently Dec. 7, 2020, at which time the duct appeared to have been completely cleared (no stent placed).  LFT's were nl when last checked (in November, prior to most recent ERCP).  D/w Dr. Johnney Killian, Dr. Watt Climes, and Dr. Michail Sermon who is our hospital doctor this week.  Labs and CT reviewed.  EXAM:  HR 109, BP 120/58, lying in bed in NAD.  States his pain is MUCH improved compared to earlier today, but nonetheless currently rates it as "10 out of 10."  Skin warm, radial pulse reasonably full, chest clr, heart nl, abd +BS's with some inconsistent voluntary guarding but overall not impressive exam at present.  Pt is alert but frustrated b/o being in pain for 12 hrs in ER, and (as floor RN also indicated to me), pt seems to become somewhat agitated when examined.  Salient points:  1. No fever or elev WBC.  No overt tenderness/rigidity/guarding noted on Dr. Samson Frederic exam.  No impressive tenderness on my exam. 2. In the past, when the pt has presented w/ biliary obstrn, the main presentation was sepsis/cholangitis rather than predominantly pain---thereby raising the issue as to whether something else is going on currently, and the elev LFT's are from "reactive hepatopathy." 3. CT does not show evid for intra-abd catastrophe such as perforation, dissection, mesenteric vascular occlusion, or bowel obstrn 4. Pt does have vasc disease, slightly elev lactate, and h/o drop in BP while on dialysis raising at least the possibility of (?transient) mesenteric ischemia  Recomm:  1. NPO for POSSIBLE ERCP tomorrow 2. In the meantime, antbx for possible cholangitis 3. Would watch clinical evolution, to see if pt declares himself as becoming sicker or having a more specific indication of the source of his sx.    In particular, would  watch for possible evolving bowel ischemia (progressive pain, progressive rise in lactate, acidosis, leukocytosis) 4. Dr. Michail Sermon to see pt in a.m.; call prn in the meantime  Cleotis Nipper, M.D. Pager [phone] If no answer or after 5 PM call [phone]

## 2019-11-05 NOTE — ED Notes (Signed)
No blood work ordered due to phlebotomist in triage unable to obtain.

## 2019-11-05 NOTE — ED Notes (Signed)
[name] wife [phone] looking for an update on the patient

## 2019-11-05 NOTE — ED Notes (Signed)
Pt at CT

## 2019-11-05 NOTE — ED Triage Notes (Signed)
Pt from dialysis with ems for c.o abd pain that started this morning with some nausea, no vomiting. Pt also hypotensive during dialysis treatment 72/54. Upon EMS arrival to dialysis center BP 100/72. Last BP prior to arrival to ED 108/62. Pt also febrile with EMS 100.6. Pt arrives to ED alert and oriented.

## 2019-11-06 ENCOUNTER — Inpatient Hospital Stay (HOSPITAL_COMMUNITY): Payer: Medicare Other

## 2019-11-06 ENCOUNTER — Ambulatory Visit: Payer: Medicare Other | Admitting: Vascular Surgery

## 2019-11-06 DIAGNOSIS — R579 Shock, unspecified: Secondary | ICD-10-CM

## 2019-11-06 DIAGNOSIS — J9601 Acute respiratory failure with hypoxia: Secondary | ICD-10-CM

## 2019-11-06 DIAGNOSIS — K831 Obstruction of bile duct: Secondary | ICD-10-CM

## 2019-11-06 DIAGNOSIS — R569 Unspecified convulsions: Secondary | ICD-10-CM

## 2019-11-06 DIAGNOSIS — R4182 Altered mental status, unspecified: Secondary | ICD-10-CM

## 2019-11-06 LAB — BLOOD CULTURE ID PANEL (REFLEXED)
Acinetobacter baumannii: NOT DETECTED
Candida albicans: NOT DETECTED
Candida glabrata: NOT DETECTED
Candida krusei: NOT DETECTED
Candida parapsilosis: NOT DETECTED
Candida tropicalis: NOT DETECTED
Carbapenem resistance: NOT DETECTED
Enterobacter cloacae complex: NOT DETECTED
Enterobacteriaceae species: DETECTED — AB
Enterococcus species: NOT DETECTED
Escherichia coli: NOT DETECTED
Haemophilus influenzae: NOT DETECTED
Klebsiella oxytoca: DETECTED — AB
Klebsiella pneumoniae: DETECTED — AB
Listeria monocytogenes: NOT DETECTED
Neisseria meningitidis: NOT DETECTED
Proteus species: NOT DETECTED
Pseudomonas aeruginosa: NOT DETECTED
Serratia marcescens: NOT DETECTED
Staphylococcus aureus (BCID): NOT DETECTED
Staphylococcus species: NOT DETECTED
Streptococcus agalactiae: NOT DETECTED
Streptococcus pneumoniae: NOT DETECTED
Streptococcus pyogenes: NOT DETECTED
Streptococcus species: NOT DETECTED

## 2019-11-06 LAB — DIC (DISSEMINATED INTRAVASCULAR COAGULATION)PANEL
D-Dimer, Quant: 10.26 ug/mL-FEU — ABNORMAL HIGH (ref 0.00–0.50)
Fibrinogen: 759 mg/dL — ABNORMAL HIGH (ref 210–475)
INR: 1.3 — ABNORMAL HIGH (ref 0.8–1.2)
Platelets: 172 10*3/uL (ref 150–400)
Prothrombin Time: 16.3 seconds — ABNORMAL HIGH (ref 11.4–15.2)
Smear Review: NONE SEEN
aPTT: 34 seconds (ref 24–36)

## 2019-11-06 LAB — COMPREHENSIVE METABOLIC PANEL
ALT: 193 U/L — ABNORMAL HIGH (ref 0–44)
AST: 148 U/L — ABNORMAL HIGH (ref 15–41)
Albumin: 2.7 g/dL — ABNORMAL LOW (ref 3.5–5.0)
Alkaline Phosphatase: 251 U/L — ABNORMAL HIGH (ref 38–126)
Anion gap: 20 — ABNORMAL HIGH (ref 5–15)
BUN: 54 mg/dL — ABNORMAL HIGH (ref 8–23)
CO2: 22 mmol/L (ref 22–32)
Calcium: 8.4 mg/dL — ABNORMAL LOW (ref 8.9–10.3)
Chloride: 99 mmol/L (ref 98–111)
Creatinine, Ser: 10.27 mg/dL — ABNORMAL HIGH (ref 0.61–1.24)
GFR calc Af Amer: 5 mL/min — ABNORMAL LOW (ref >60)
GFR calc non Af Amer: 5 mL/min — ABNORMAL LOW (ref >60)
Glucose, Bld: 158 mg/dL — ABNORMAL HIGH (ref 70–99)
Potassium: 4.9 mmol/L (ref 3.5–5.1)
Sodium: 141 mmol/L (ref 135–145)
Total Bilirubin: 4 mg/dL — ABNORMAL HIGH (ref 0.3–1.2)
Total Protein: 6.5 g/dL (ref 6.5–8.1)

## 2019-11-06 LAB — POCT I-STAT 7, (LYTES, BLD GAS, ICA,H+H)
Acid-base deficit: 4 mmol/L — ABNORMAL HIGH (ref 0.0–2.0)
Acid-base deficit: 15 mmol/L — ABNORMAL HIGH (ref 0.0–2.0)
Acid-base deficit: 12 mmol/L — ABNORMAL HIGH (ref 0.0–2.0)
Acid-base deficit: 15 mmol/L — ABNORMAL HIGH (ref 0.0–2.0)
Bicarbonate: 22.6 mmol/L (ref 20.0–28.0)
Bicarbonate: 15.1 mmol/L — ABNORMAL LOW (ref 20.0–28.0)
Bicarbonate: 17.2 mmol/L — ABNORMAL LOW (ref 20.0–28.0)
Bicarbonate: 13.7 mmol/L — ABNORMAL LOW (ref 20.0–28.0)
Calcium, Ion: 1 mmol/L — ABNORMAL LOW (ref 1.15–1.40)
Calcium, Ion: 0.97 mmol/L — ABNORMAL LOW (ref 1.15–1.40)
Calcium, Ion: 0.94 mmol/L — ABNORMAL LOW (ref 1.15–1.40)
Calcium, Ion: 0.9 mmol/L — ABNORMAL LOW (ref 1.15–1.40)
HCT: 32 % — ABNORMAL LOW (ref 39.0–52.0)
HCT: 29 % — ABNORMAL LOW (ref 39.0–52.0)
HCT: 28 % — ABNORMAL LOW (ref 39.0–52.0)
HCT: 30 % — ABNORMAL LOW (ref 39.0–52.0)
Hemoglobin: 10.9 g/dL — ABNORMAL LOW (ref 13.0–17.0)
Hemoglobin: 9.9 g/dL — ABNORMAL LOW (ref 13.0–17.0)
Hemoglobin: 9.5 g/dL — ABNORMAL LOW (ref 13.0–17.0)
Hemoglobin: 10.2 g/dL — ABNORMAL LOW (ref 13.0–17.0)
O2 Saturation: 87 %
O2 Saturation: 86 %
O2 Saturation: 90 %
O2 Saturation: 99 %
Patient temperature: 103
Patient temperature: 99.4
Patient temperature: 99.9
Patient temperature: 100.6
Potassium: 3.8 mmol/L (ref 3.5–5.1)
Potassium: 4.5 mmol/L (ref 3.5–5.1)
Potassium: 4.6 mmol/L (ref 3.5–5.1)
Potassium: 5 mmol/L (ref 3.5–5.1)
Sodium: 142 mmol/L (ref 135–145)
Sodium: 142 mmol/L (ref 135–145)
Sodium: 142 mmol/L (ref 135–145)
Sodium: 141 mmol/L (ref 135–145)
TCO2: 24 mmol/L (ref 22–32)
TCO2: 17 mmol/L — ABNORMAL LOW (ref 22–32)
TCO2: 19 mmol/L — ABNORMAL LOW (ref 22–32)
TCO2: 15 mmol/L — ABNORMAL LOW (ref 22–32)
pCO2 arterial: 54.4 mmHg — ABNORMAL HIGH (ref 32.0–48.0)
pCO2 arterial: 56.9 mmHg — ABNORMAL HIGH (ref 32.0–48.0)
pCO2 arterial: 55.2 mmHg — ABNORMAL HIGH (ref 32.0–48.0)
pCO2 arterial: 48 mmHg (ref 32.0–48.0)
pH, Arterial: 7.239 — ABNORMAL LOW (ref 7.350–7.450)
pH, Arterial: 7.035 — CL (ref 7.350–7.450)
pH, Arterial: 7.105 — CL (ref 7.350–7.450)
pH, Arterial: 7.072 — CL (ref 7.350–7.450)
pO2, Arterial: 73 mmHg — ABNORMAL LOW (ref 83.0–108.0)
pO2, Arterial: 77 mmHg — ABNORMAL LOW (ref 83.0–108.0)
pO2, Arterial: 84 mmHg (ref 83.0–108.0)
pO2, Arterial: 232 mmHg — ABNORMAL HIGH (ref 83.0–108.0)

## 2019-11-06 LAB — CBC WITH DIFFERENTIAL/PLATELET
Abs Immature Granulocytes: 0.1 10*3/uL — ABNORMAL HIGH (ref 0.00–0.07)
Band Neutrophils: 5 %
Basophils Absolute: 0 10*3/uL (ref 0.0–0.1)
Basophils Relative: 0 %
Eosinophils Absolute: 0.2 10*3/uL (ref 0.0–0.5)
Eosinophils Relative: 5 %
HCT: 35.1 % — ABNORMAL LOW (ref 39.0–52.0)
Hemoglobin: 10.7 g/dL — ABNORMAL LOW (ref 13.0–17.0)
Lymphocytes Relative: 24 %
Lymphs Abs: 0.8 10*3/uL (ref 0.7–4.0)
MCH: 30.2 pg (ref 26.0–34.0)
MCHC: 30.5 g/dL (ref 30.0–36.0)
MCV: 99.2 fL (ref 80.0–100.0)
Metamyelocytes Relative: 2 %
Monocytes Absolute: 0.1 10*3/uL (ref 0.1–1.0)
Monocytes Relative: 2 %
Neutro Abs: 2.3 10*3/uL (ref 1.7–7.7)
Neutrophils Relative %: 62 %
Platelets: 181 10*3/uL (ref 150–400)
RBC: 3.54 MIL/uL — ABNORMAL LOW (ref 4.22–5.81)
RDW: 18.8 % — ABNORMAL HIGH (ref 11.5–15.5)
WBC: 3.4 10*3/uL — ABNORMAL LOW (ref 4.0–10.5)
nRBC: 0 % (ref 0.0–0.2)
nRBC: 0 /100 WBC

## 2019-11-06 LAB — BLOOD GAS, ARTERIAL
Acid-base deficit: 1.2 mmol/L (ref 0.0–2.0)
Bicarbonate: 22.5 mmol/L (ref 20.0–28.0)
Drawn by: 347191
FIO2: 44
O2 Saturation: 90.6 %
Patient temperature: 37
pCO2 arterial: 34.2 mmHg (ref 32.0–48.0)
pH, Arterial: 7.433 (ref 7.350–7.450)
pO2, Arterial: 61.2 mmHg — ABNORMAL LOW (ref 83.0–108.0)

## 2019-11-06 LAB — LACTIC ACID, PLASMA
Lactic Acid, Venous: 7.4 mmol/L (ref 0.5–1.9)
Lactic Acid, Venous: 8.4 mmol/L (ref 0.5–1.9)
Lactic Acid, Venous: 6.6 mmol/L (ref 0.5–1.9)

## 2019-11-06 LAB — HEMOGLOBIN A1C
Hgb A1c MFr Bld: 6.4 % — ABNORMAL HIGH (ref 4.8–5.6)
Mean Plasma Glucose: 136.98 mg/dL

## 2019-11-06 LAB — CORTISOL: Cortisol, Plasma: 93.9 ug/dL

## 2019-11-06 LAB — MRSA PCR SCREENING: MRSA by PCR: NEGATIVE

## 2019-11-06 LAB — GLUCOSE, CAPILLARY
Glucose-Capillary: 160 mg/dL — ABNORMAL HIGH (ref 70–99)
Glucose-Capillary: 85 mg/dL (ref 70–99)
Glucose-Capillary: 72 mg/dL (ref 70–99)
Glucose-Capillary: 22 mg/dL — CL (ref 70–99)
Glucose-Capillary: 34 mg/dL — CL (ref 70–99)
Glucose-Capillary: 54 mg/dL — ABNORMAL LOW (ref 70–99)
Glucose-Capillary: 96 mg/dL (ref 70–99)
Glucose-Capillary: 135 mg/dL — ABNORMAL HIGH (ref 70–99)
Glucose-Capillary: 125 mg/dL — ABNORMAL HIGH (ref 70–99)

## 2019-11-06 LAB — AMMONIA: Ammonia: 39 umol/L — ABNORMAL HIGH (ref 9–35)

## 2019-11-06 LAB — TSH: TSH: 0.995 u[IU]/mL (ref 0.350–4.500)

## 2019-11-06 LAB — LIPASE, BLOOD: Lipase: 29 U/L (ref 11–51)

## 2019-11-06 MED ORDER — DOXERCALCIFEROL 4 MCG/2ML IV SOLN: 3.0000 ug | INTRAVENOUS | Status: DC

## 2019-11-06 MED ORDER — LEVETIRACETAM IN NACL 1000 MG/100ML IV SOLN
1000.0000 mg | INTRAVENOUS | Status: AC
  Administered 2019-11-06: 04:00:00 1000 mg via INTRAVENOUS
  Filled 2019-11-06: qty 100

## 2019-11-06 MED ORDER — LEVETIRACETAM IN NACL 500 MG/100ML IV SOLN
500.0000 mg | Freq: Two times a day (BID) | INTRAVENOUS | Status: DC
  Administered 2019-11-06 (×2): 500 mg via INTRAVENOUS
  Filled 2019-11-06 (×2): qty 100

## 2019-11-06 MED ORDER — CINACALCET HCL 30 MG PO TABS
90.0000 mg | ORAL_TABLET | Freq: Every day | ORAL | Status: DC
  Filled 2019-11-06: qty 3

## 2019-11-06 MED ORDER — LEVETIRACETAM IN NACL 1000 MG/100ML IV SOLN
1000.0000 mg | INTRAVENOUS | Status: AC
  Administered 2019-11-06: 1000 mg via INTRAVENOUS
  Filled 2019-11-06: qty 100

## 2019-11-06 MED ORDER — VANCOMYCIN HCL 2000 MG/400ML IV SOLN
2000.0000 mg | Freq: Once | INTRAVENOUS | Status: AC
  Administered 2019-11-06: 2000 mg via INTRAVENOUS
  Filled 2019-11-06: qty 400

## 2019-11-06 MED ORDER — DEXTROSE 50 % IV SOLN: 25.0000 g | INTRAVENOUS | Status: AC

## 2019-11-06 MED ORDER — PHENYLEPHRINE 40 MCG/ML (10ML) SYRINGE FOR IV PUSH (FOR BLOOD PRESSURE SUPPORT)
80.0000 ug | PREFILLED_SYRINGE | Freq: Once | INTRAVENOUS | Status: DC
  Filled 2019-11-06: qty 10

## 2019-11-06 MED ORDER — ETOMIDATE 2 MG/ML IV SOLN
30.0000 mg | Freq: Once | INTRAVENOUS | Status: AC
  Administered 2019-11-06: 07:00:00 30 mg via INTRAVENOUS

## 2019-11-06 MED ORDER — SODIUM BICARBONATE 8.4 % IV SOLN
100.0000 meq | Freq: Once | INTRAVENOUS | Status: AC
  Administered 2019-11-06: 17:00:00 100 meq via INTRAVENOUS

## 2019-11-06 MED ORDER — VASOPRESSIN 20 UNIT/ML IV SOLN
0.0300 [IU]/min | INTRAVENOUS | Status: DC
  Administered 2019-11-06 – 2019-11-07 (×2): 0.03 [IU]/min via INTRAVENOUS
  Filled 2019-11-06 (×2): qty 2

## 2019-11-06 MED ORDER — CHLORHEXIDINE GLUCONATE CLOTH 2 % EX PADS: 6.0000 | MEDICATED_PAD | Freq: Every day | CUTANEOUS | Status: DC

## 2019-11-06 MED ORDER — DEXTROSE 50 % IV SOLN
INTRAVENOUS | Status: AC
  Administered 2019-11-06: 25 g via INTRAVENOUS
  Filled 2019-11-06: qty 50

## 2019-11-06 MED ORDER — SODIUM CHLORIDE 0.9% FLUSH
10.0000 mL | Freq: Two times a day (BID) | INTRAVENOUS | Status: DC
  Administered 2019-11-06 (×2): 10 mL

## 2019-11-06 MED ORDER — SODIUM CHLORIDE 0.9 % IV SOLN: INTRAVENOUS | Status: DC

## 2019-11-06 MED ORDER — PHENYLEPHRINE HCL-NACL 40-0.9 MG/250ML-% IV SOLN: 0.0000 ug/min | INTRAVENOUS | Status: DC

## 2019-11-06 MED ORDER — PHENYLEPHRINE HCL-NACL 10-0.9 MG/250ML-% IV SOLN
INTRAVENOUS | Status: AC
  Filled 2019-11-06: qty 250

## 2019-11-06 MED ORDER — DEXTROSE 50 % IV SOLN: 25.0000 g | Freq: Once | INTRAVENOUS | Status: AC

## 2019-11-06 MED ORDER — PANTOPRAZOLE SODIUM 40 MG IV SOLR: 40.0000 mg | INTRAVENOUS | Status: DC

## 2019-11-06 MED ORDER — ACETAMINOPHEN 325 MG PO TABS
650.0000 mg | ORAL_TABLET | Freq: Four times a day (QID) | ORAL | Status: DC | PRN
  Administered 2019-11-06: 10:00:00 650 mg
  Filled 2019-11-06: qty 2

## 2019-11-06 MED ORDER — MIDAZOLAM HCL 2 MG/2ML IJ SOLN
1.0000 mg | INTRAMUSCULAR | Status: DC | PRN
  Filled 2019-11-06: qty 2

## 2019-11-06 MED ORDER — FENTANYL 2500MCG IN NS 250ML (10MCG/ML) PREMIX INFUSION
0.0000 ug/h | INTRAVENOUS | Status: DC
  Administered 2019-11-06: 07:00:00 50 ug/h via INTRAVENOUS
  Filled 2019-11-06 (×2): qty 250

## 2019-11-06 MED ORDER — PHENYLEPHRINE HCL-NACL 20-0.9 MG/250ML-% IV SOLN
0.0000 ug/min | INTRAVENOUS | Status: DC
  Administered 2019-11-06 – 2019-11-07 (×3): 160 ug/min via INTRAVENOUS
  Filled 2019-11-06 (×5): qty 250

## 2019-11-06 MED ORDER — PHENYLEPHRINE HCL-NACL 10-0.9 MG/250ML-% IV SOLN
0.0000 ug/min | INTRAVENOUS | Status: DC
  Administered 2019-11-06: 20 ug/min via INTRAVENOUS
  Administered 2019-11-06 (×4): 160 ug/min via INTRAVENOUS
  Filled 2019-11-06: qty 250
  Filled 2019-11-06: qty 500
  Filled 2019-11-06: qty 250

## 2019-11-06 MED ORDER — SODIUM CHLORIDE 0.9% FLUSH: 10.0000 mL | INTRAVENOUS | Status: DC | PRN

## 2019-11-06 MED ORDER — NALOXONE HCL 0.4 MG/ML IJ SOLN
INTRAMUSCULAR | Status: AC
  Filled 2019-11-06: qty 1

## 2019-11-06 MED ORDER — METHIMAZOLE 5 MG PO TABS: 2.5000 mg | ORAL_TABLET | Freq: Every day | ORAL | Status: DC

## 2019-11-06 MED ORDER — NOREPINEPHRINE 16 MG/250ML-% IV SOLN
0.0000 ug/min | INTRAVENOUS | Status: DC
  Administered 2019-11-06: 14:00:00 40 ug/min via INTRAVENOUS
  Administered 2019-11-06: 07:00:00 30 ug/min via INTRAVENOUS
  Administered 2019-11-06 (×2): 60 ug/min via INTRAVENOUS
  Filled 2019-11-06 (×5): qty 250

## 2019-11-06 MED ORDER — DEXTROSE 50 % IV SOLN
25.0000 g | Freq: Once | INTRAVENOUS | Status: AC
  Administered 2019-11-06: 17:00:00 25 g via INTRAVENOUS
  Filled 2019-11-06: qty 50

## 2019-11-06 MED ORDER — SODIUM BICARBONATE 8.4 % IV SOLN
INTRAVENOUS | Status: AC
  Administered 2019-11-06: 06:00:00 50 meq
  Filled 2019-11-06: qty 50

## 2019-11-06 MED ORDER — FENTANYL BOLUS VIA INFUSION
25.0000 ug | INTRAVENOUS | Status: DC | PRN
  Filled 2019-11-06: qty 25

## 2019-11-06 MED ORDER — SODIUM BICARBONATE-DEXTROSE 150-5 MEQ/L-% IV SOLN
150.0000 meq | INTRAVENOUS | Status: DC
  Administered 2019-11-06 – 2019-11-07 (×2): 150 meq via INTRAVENOUS
  Filled 2019-11-06 (×2): qty 1000

## 2019-11-06 MED ORDER — INSULIN ASPART 100 UNIT/ML ~~LOC~~ SOLN: 0.0000 [IU] | SUBCUTANEOUS | Status: DC

## 2019-11-06 MED ORDER — DEXTROSE 50 % IV SOLN
INTRAVENOUS | Status: AC
  Administered 2019-11-06: 16:00:00 25 g via INTRAVENOUS
  Filled 2019-11-06: qty 50

## 2019-11-06 MED ORDER — SODIUM CHLORIDE 0.9 % IV BOLUS
1000.0000 mL | Freq: Once | INTRAVENOUS | Status: AC
  Administered 2019-11-06: 1000 mL via INTRAVENOUS

## 2019-11-06 MED ORDER — ACETAMINOPHEN 650 MG RE SUPP: 650.0000 mg | Freq: Four times a day (QID) | RECTAL | Status: DC | PRN

## 2019-11-06 MED ORDER — FENTANYL CITRATE (PF) 100 MCG/2ML IJ SOLN
INTRAMUSCULAR | Status: AC
  Filled 2019-11-06: qty 2

## 2019-11-06 MED ORDER — HEPARIN SODIUM (PORCINE) 5000 UNIT/ML IJ SOLN
5000.0000 [IU] | Freq: Three times a day (TID) | INTRAMUSCULAR | Status: DC
  Administered 2019-11-06 (×3): 5000 [IU] via SUBCUTANEOUS
  Filled 2019-11-06 (×3): qty 1

## 2019-11-06 MED ORDER — NALOXONE HCL 0.4 MG/ML IJ SOLN
0.4000 mg | INTRAMUSCULAR | Status: DC | PRN
  Administered 2019-11-06: 0.4 mg via INTRAVENOUS
  Filled 2019-11-06: qty 1

## 2019-11-06 MED ORDER — VANCOMYCIN VARIABLE DOSE PER UNSTABLE RENAL FUNCTION (PHARMACIST DOSING): Status: DC

## 2019-11-06 MED ORDER — SODIUM BICARBONATE 8.4 % IV SOLN
INTRAVENOUS | Status: AC
  Filled 2019-11-06: qty 100

## 2019-11-06 MED ORDER — HYDROCORTISONE NA SUCCINATE PF 100 MG IJ SOLR
50.0000 mg | Freq: Four times a day (QID) | INTRAMUSCULAR | Status: DC
  Administered 2019-11-06 – 2019-11-07 (×4): 50 mg via INTRAVENOUS
  Filled 2019-11-06 (×4): qty 2

## 2019-11-06 MED ORDER — SODIUM BICARBONATE 8.4 % IV SOLN: INTRAVENOUS | Status: DC

## 2019-11-06 MED ORDER — NOREPINEPHRINE 4 MG/250ML-% IV SOLN
0.0000 ug/min | INTRAVENOUS | Status: DC
  Filled 2019-11-06: qty 250

## 2019-11-06 MED ORDER — FENTANYL 2500MCG IN NS 250ML (10MCG/ML) PREMIX INFUSION
25.0000 ug/h | INTRAVENOUS | Status: DC
  Administered 2019-11-06: 19:00:00 150 ug/h via INTRAVENOUS

## 2019-11-06 MED ORDER — ROCURONIUM BROMIDE 50 MG/5ML IV SOLN
100.0000 mg | Freq: Once | INTRAVENOUS | Status: AC
  Administered 2019-11-06: 100 mg via INTRAVENOUS
  Filled 2019-11-06 (×2): qty 10

## 2019-11-06 MED ORDER — LORAZEPAM 2 MG/ML IJ SOLN
INTRAMUSCULAR | Status: AC
  Filled 2019-11-06: qty 1

## 2019-11-06 NOTE — Procedures (Signed)
Arterial Catheter Insertion Procedure Note DERRIK MCEACHERN [id number] 28-Apr-1951  Procedure: Insertion of Arterial Catheter  Indications: Blood pressure monitoring and Frequent blood sampling  Procedure Details Consent: Unable to obtain consent because of emergent medical necessity. Time Out: Verified patient identification, verified procedure, site/side was marked, verified correct patient position, special equipment/implants available, medications/allergies/relevent history reviewed, required imaging and test results available.  Performed  Maximum sterile technique was used including antiseptics, cap, gloves, gown, hand hygiene, mask and sheet. Skin prep: Chlorhexidine; local anesthetic administered 20 gauge catheter was inserted into right radial artery using the Seldinger technique. ULTRASOUND GUIDANCE USED: YES Evaluation Blood flow good; BP tracing good. Complications: No apparent complications.    [name], MSN, AGACNP-BC [facility] 11/06/2019, 5:47 AM

## 2019-11-06 NOTE — Consult Note (Addendum)
Neurology Consultation Reason for Consult: Seizure Referring Physician: Hal Hope, A  CC: Facial twitching  History is obtained from: Chart review  HPI: [name] is a 69 y.o. male with a history of diabetes, ESRD, hypothyroidism, dementia who presents with seizure that started sometime around 3 AM.  He was seen sometime around 2 AM and was speaking, and then on 3 AM was discovered that he was having decreased responsiveness and I was called initially for the decreased responsiveness.  On my arrival, however, he was having left facial twitching as well as left shoulder twitching.  He was also hypoxic and mildly hypotensive, however his blood pressure was still such that I felt that it was prudent to give 1 mg of IV Ativan to try and obtain control of seizures earlier rather than later.  He was then loaded with 2 g of IV Keppra and taken for emergent head CT.  By the time of my reevaluation in CT, his seizure activity had ceased.  He was admitted for abdominal pain, elevated LFTs concerning for recurrent biliary obstruction.  LKW: 2 AM tPA given: No, seizure at onset  ROS:  Unable to obtain due to altered mental status.   Past Medical History:  Diagnosis Date  . Acute encephalopathy 10/04/2018  . Allergic rhinitis, cause unspecified 02/24/2014  . Anemia 06/16/2011  . BENIGN PROSTATIC HYPERTROPHY 10/14/2009  . CAD, NATIVE VESSEL 02/06/2009   a. 06/2007 s/p Taxus DES to the RCA;  b. 08/2016 NSTEMI in setting of SVT/PCI: LM 30ost, LAD 46m (3.0x16 Synergy DES), LCX 49m, OM1 60, RI 40, RCA 70p/m, 36m - not amenable to PCI.  [name] Cervical radiculopathy, chronic 02/23/2016   Right c5-6 by NCS/EMG  . Chest pain 08/09/2015  . Chronic combined systolic (congestive) and diastolic (congestive) heart failure ([place])    a. 10/2016 Echo: EF 40-45%, Gr2 DD. mildly dil LA.  [name] COLONIC POLYPS, HX OF 10/14/2009  . Dementia ([place]) [id number]  . Depression 09/24/2015  . DIABETES MELLITUS, TYPE II 02/01/2010  .  DYSLIPIDEMIA 06/18/2007  . ESRD (end stage renal disease) on dialysis ([place]) 08/04/2010   ESRD due to DM adn HTN, started HD in 2011. As of 2019 has a R arm graft and gets HD on TTS sched  . FOOT PAIN 08/12/2008  . GAIT DISTURBANCE 03/03/2010  . GASTROENTERITIS, VIRAL 10/14/2009  . GERD 06/18/2007  . GOITER, MULTINODULAR 12/26/2007  . GOUT 06/18/2007  . GYNECOMASTIA 07/17/2010  . Hemodialysis access, fistula mature [facility])    Dialysis T-Th-Sa ([place]) Right upper arm fistula  . Hyperlipidemia 10/16/2011  . Hyperparathyroidism, secondary ([place]) 06/16/2011  . HYPERTENSION 06/18/2007  . Hyperthyroidism   . Hypocalcemia 06/07/2010  . Ischemic cardiomyopathy    a. 10/2016 Echo: EF 40-45%.  . Lumbar stenosis with neurogenic claudication   . ONYCHOMYCOSIS, TOENAILS 12/26/2007  . OSA on CPAP 10/16/2011  . Other malaise and fatigue 11/24/2009  . PERIPHERAL NEUROPATHY 06/18/2007  . Prostate cancer ([place])   . PSVT (paroxysmal supraventricular tachycardia) ([place])    a. 62/3762 complicated by NSTEMI;  b. 11/2016 Treated w/ adenosine in ED;  c. 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  [name] PULMONARY NODULE, RIGHT LOWER LOBE 06/08/2009  . Sleep apnea    cpap machine and o2  . TRANSAMINASES, SERUM, ELEVATED 02/01/2010  . Transfusion history    none recent  . Unspecified hypotension 01/30/2010     Family History  Problem Relation Age of Onset  . Heart disease Sister   . Thyroid  nodules Sister   . Heart disease Father   . Diabetes Father   . Kidney failure Father   . Hypertension Father   . Healthy Child   . Healthy Child   . Healthy Child   . Cancer Neg Hx      Social History:  reports that he quit smoking about 14 years ago. His smoking use included cigarettes. He has a 25.00 pack-year smoking history. He quit smokeless tobacco use about 14 years ago. He reports that he does not drink alcohol or use drugs.   Exam: Current vital signs: BP (!) 120/58 (BP Location: Left Arm)   Pulse (!) 108   Temp  98.5 F (36.9 C) (Oral)   Resp 19   SpO2 100%  Vital signs in last 24 hours: Temp:  [98.5 F (36.9 C)-99.5 F (37.5 C)] 98.5 F (36.9 C) (02/04 2304) Pulse Rate:  [106-113] 108 (02/04 2304) Resp:  [19-24] 19 (02/04 2304) BP: (104-123)/(58-89) 120/58 (02/04 2304) SpO2:  [89 %-100 %] 100 % (02/05 0404) FiO2 (%):  [80 %] 80 % (02/05 0347)   Physical Exam  Constitutional: Appears well-developed and well-nourished.  Psych: Does not answer questions Eyes: No scleral injection HENT: No OP obstrucion MSK: no joint deformities.  Cardiovascular: Normal rate and regular rhythm.  Respiratory: Effort normal, non-labored breathing GI: Soft.  No distension. There is no tenderness.  Skin: WDI  Neuro: Mental Status: Patient is able to tell me his name, but does not reliably follow any other commands.  He is awake, alert Cranial Nerves: II: He fixates and tracks.  Pupils are equal, round, and reactive to light.   III,IV, VI: Eyes are midline, able to cross midline in both directions V, VII: He is having active clonic activity on the left side of his face Motor: He withdraws to noxious stimulation in all extremities, including lifting against gravity in bilateral upper extremities.  Sensory: He responds to noxious stimulation in all 4 extremities Cerebellar: He does not perform  I have reviewed labs in epic and the results pertinent to this consultation are: Mildly elevated LFTs, creatinine 10.27  I have reviewed the images obtained: CT head-negative  Impression: 69 year old male with new onset focal status epilepticus in setting of sepsis.  MRI from January shows extensive small vessel disease including some just cortical lesions which could theoretically be a seizure source, also seizures can occur in the setting of dementia.  Currently sepsis with lower seizure threshold.  Unfortunately his mental status has declined considerably following treatment and he has been intubated per  CCM.  Recommendations: 1) EEG. 2) MRI brain 3) Keppra 500 twice daily 4) neurology will continue to follow   This patient is critically ill and at significant risk of neurological worsening, death and care requires constant monitoring of vital signs, hemodynamics,respiratory and cardiac monitoring, neurological assessment, discussion with family, other specialists and medical decision making of high complexity. I spent 35 minutes of neurocritical care time  in the care of  this patient. This was time spent independent of any time provided by nurse practitioner or PA.  Roland Rack, MD [facility] [phone]  If 7pm- 7am, please page neurology on call as listed in [place]. 11/06/2019  7:29 AM

## 2019-11-06 NOTE — Progress Notes (Addendum)
Subjective: Patient was noted to be in status epilepticus last night.  He was started on Keppra twice daily.  EEG this morning did not show any epileptiform discharges.  ROS: Unable to obtain due to poor mental status  Examination  Vital signs in last 24 hours: Temp:  [98.5 F (36.9 C)-103 F (39.4 C)] 99.4 F (37.4 C) (02/05 1151) Pulse Rate:  [25-124] 113 (02/05 1150) Resp:  [13-26] 18 (02/05 1200) BP: (42-143)/(22-122) 51/25 (02/05 1200) SpO2:  [50 %-100 %] 95 % (02/05 1200) Arterial Line BP: (93-232)/(35-159) 116/44 (02/05 1200) FiO2 (%):  [70 %-80 %] 70 % (02/05 1104)  General: lying in bed, not in apparent distress CVS: pulse-normal rate and rhythm RS: breathing comfortably, intubated Extremities: normal, warm  Neuro: MS: Opens eyes to noxious stimuli, does not follow commands CN: pupils equal and reactive, no forced gaze deviation, corneal reflex intact, gag reflex absent, unable to assess rest of the cranial nerves due to intubation o motor: Winces but does not withdraw to noxious stimuli in all 4 extremities Reflexes: 1+ bilaterally over patella, biceps, plantars: Mute bilaterally  Basic Metabolic Panel: Recent Labs  Lab 11/11/2019 1700 11/06/19 0335  NA 141 141  K 4.2 4.9  CL 95* 99  CO2 29 22  GLUCOSE 213* 158*  BUN 43* 54*  CREATININE 8.73* 10.27*  CALCIUM 8.8* 8.4*    CBC: Recent Labs  Lab 11/27/2019 1700 11/06/19 0335 11/06/19 0624  WBC 7.7 3.4*  --   NEUTROABS 7.1 2.3  --   HGB 11.2* 10.7*  --   HCT 37.0* 35.1*  --   MCV 98.9 99.2  --   PLT 303 181 172     Coagulation Studies: Recent Labs    11/18/2019 1700 11/06/19 0624  LABPROT 13.4 16.3*  INR 1.0 1.3*    Imaging CT head without contrast 11/06/2019:Stable CT appearance of the brain since October with no acute intracranial abnormality.   ASSESSMENT AND PLAN: 69 year old male with multiple comorbidities, sepsis who was noted to have new onset focal status epilepticus overnight, started on  Keppra.  Acute encephalopathy End-stage renal disease on HD TTS Hyperglycemia Lactic acidosis Hyperammonemia Transaminitis Hyperbilirubinemia Anemia Leukopenia Prostate cancer status post radiotherapy Chronic right cerebellar infarct - Routine EEG did not show any epileptiform activity - CT head did not show any abnormality -Acute encephalopathy likely secondary to infection, transaminitis, hyperbilirubinemia, mild hyperammonemia, postictal state -Seizure likely provoked in the setting of etiologies listed above.  There is also possibility of new onset epilepsy due to dementia  Recommendations -Continue Keppra 500 mg twice daily. Cannot increase Keppra due to renal dysfunction.  -If patient has any further seizures, consider adding another antiepileptic like Vimpat at the lowest possible dose.  Would avoid phenytoin due to hepatic dysfunction. -MRI brain without contrast ordered and pending -Low suspicion for meningitis/encephalitis as etiology of altered mental status as patient presented with abdominal pain and vomiting and has multiple other etiologies to explain altered mental status. -Minimize any sedating medications -Continue seizure precautions -As needed IV Ativan 1 mg for generalized tonic-clonic seizure lasting more than 2 minutes or focal seizure lasting more than 5 minutes -Management of other comorbidities per primary team  CRITICAL CARE Performed by: Lora Havens   Total critical care time: 35 minutes  Critical care time was exclusive of separately billable procedures and treating other patients.  Critical care was necessary to treat or prevent imminent or life-threatening deterioration.  Critical care was time spent personally by me on  the following activities: development of treatment plan with patient and/or surrogate as well as nursing, discussions with consultants, evaluation of patient's response to treatment, examination of patient, obtaining history  from patient or surrogate, ordering and performing treatments and interventions, ordering and review of laboratory studies, ordering and review of radiographic studies, pulse oximetry and re-evaluation of patient's condition.

## 2019-11-06 NOTE — Consult Note (Signed)
[facility]  INPATIENT CONSULTATION  Reason for Consultation: ESRD Requesting Provider: Dr. Halford Chessman   HPI: [name] is an 69 y.o. male with ESRD on HD TTS, recurrent klebsiella bacteremia, C.diff colitis, HTN, ICM, HLD, OSA on CPAP, prostate CA s/p radiotherapy, peripheral neuropathy, gout, DM, hyperthyroidism, CVA, depression, colonic polyp who presented with abd pain and nausea, admitted with septic shock and nephrology is consulted regarding eval and mangement of ESRD/dialysis and assoc conditions.   Pt presented with above, then developed AMS after pain medication - no improvement with narcan, had twitching c/f seizure.  Became febrile but was not initially.  He is currently intubated in ICU requiring vasopressor support for shock -- last lactate 8.  On broad spectrum abx while eval for source underway -- CT C/A/P with contrast yesterday with dilated CBD with sludge on h/o recurrent biliary stones.  GI holding on ERCP for now.    Last HD was yesterday and rec'd about half of 4:15 tx. Discussed with wife at bedside who is asking if his kidneys are down -- he's been on HD x 10 years and I explained he's been in kidney failure that entire time.   PMH: Past Medical History:  Diagnosis Date  . Acute encephalopathy 10/04/2018  . Allergic rhinitis, cause unspecified 02/24/2014  . Anemia 06/16/2011  . BENIGN PROSTATIC HYPERTROPHY 10/14/2009  . CAD, NATIVE VESSEL 02/06/2009   a. 06/2007 s/p Taxus DES to the RCA;  b. 08/2016 NSTEMI in setting of SVT/PCI: LM 30ost, LAD 20m (3.0x16 Synergy DES), LCX 61m, OM1 60, RI 40, RCA 70p/m, 67m - not amenable to PCI.  [name] Cervical radiculopathy, chronic 02/23/2016   Right c5-6 by NCS/EMG  . Chest pain 08/09/2015  . Chronic combined systolic (congestive) and diastolic (congestive) heart failure ([place])    a. 10/2016 Echo: EF 40-45%, Gr2 DD. mildly dil LA.  [name] COLONIC POLYPS, HX OF 10/14/2009  . Dementia ([place]) [id number]  . Depression 09/24/2015  .  DIABETES MELLITUS, TYPE II 02/01/2010  . DYSLIPIDEMIA 06/18/2007  . ESRD (end stage renal disease) on dialysis ([place]) 08/04/2010   ESRD due to DM adn HTN, started HD in 2011. As of 2019 has a R arm graft and gets HD on TTS sched  . FOOT PAIN 08/12/2008  . GAIT DISTURBANCE 03/03/2010  . GASTROENTERITIS, VIRAL 10/14/2009  . GERD 06/18/2007  . GOITER, MULTINODULAR 12/26/2007  . GOUT 06/18/2007  . GYNECOMASTIA 07/17/2010  . Hemodialysis access, fistula mature [facility])    Dialysis T-Th-Sa ([place]) Right upper arm fistula  . Hyperlipidemia 10/16/2011  . Hyperparathyroidism, secondary ([place]) 06/16/2011  . HYPERTENSION 06/18/2007  . Hyperthyroidism   . Hypocalcemia 06/07/2010  . Ischemic cardiomyopathy    a. 10/2016 Echo: EF 40-45%.  . Lumbar stenosis with neurogenic claudication   . ONYCHOMYCOSIS, TOENAILS 12/26/2007  . OSA on CPAP 10/16/2011  . Other malaise and fatigue 11/24/2009  . PERIPHERAL NEUROPATHY 06/18/2007  . Prostate cancer ([place])   . PSVT (paroxysmal supraventricular tachycardia) ([place])    a. 95/2841 complicated by NSTEMI;  b. 11/2016 Treated w/ adenosine in ED;  c. 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  [name] PULMONARY NODULE, RIGHT LOWER LOBE 06/08/2009  . Sleep apnea    cpap machine and o2  . TRANSAMINASES, SERUM, ELEVATED 02/01/2010  . Transfusion history    none recent  . Unspecified hypotension 01/30/2010   PSH: Past Surgical History:  Procedure Laterality Date  . A/V SHUNTOGRAM N/A 04/07/2018   Procedure: A/V SHUNTOGRAM;  Surgeon:  Waynetta Sandy, MD;  Location: [place] [facility];  Service: Cardiovascular;  Laterality: N/A;  . ARTERIOVENOUS GRAFT PLACEMENT Right 2009   forearm/notes 02/01/2011  . AV FISTULA PLACEMENT  11/07/2011   Procedure: INSERTION OF ARTERIOVENOUS (AV) GORE-TEX GRAFT ARM;  Surgeon: Tinnie Gens, MD;  Location: [place];  Service: Vascular;  Laterality: Left;  . BACK SURGERY  1998  . BALLOON DILATION N/A 11/14/2018   Procedure: BALLOON DILATION;  Surgeon: Ronnette Juniper, MD;  Location: [place];  Service: Gastroenterology;  Laterality: N/A;  . BASCILIC VEIN TRANSPOSITION Right 02/27/2013   Procedure: BASCILIC VEIN TRANSPOSITION;  Surgeon: Mal Misty, MD;  Location: [place];  Service: Vascular;  Laterality: Right;  Right Basilic Vein Transposition   . BILIARY DILATION  03/31/2019   Procedure: BILIARY DILATION;  Surgeon: Clarene Essex, MD;  Location: [facility];  Service: Endoscopy;;  . BILIARY DILATION  05/20/2019   Procedure: BILIARY DILATION;  Surgeon: Clarene Essex, MD;  [facility];  Service: Endoscopy;;  . BILIARY STENT PLACEMENT  03/31/2019   Procedure: BILIARY STENT PLACEMENT;  Surgeon: Clarene Essex, MD;  Location: [facility] ENDOSCOPY;  Service: Endoscopy;;  . BILIARY STENT PLACEMENT N/A 05/20/2019   Procedure: BILIARY STENT PLACEMENT;  Surgeon: Clarene Essex, MD;  [facility];  Service: Endoscopy;  Laterality: N/A;  . BILIARY STENT PLACEMENT  08/03/2019   Procedure: BILIARY STENT PLACEMENT;  Surgeon: Arta Silence, MD;  Location: [place];  Service: Endoscopy;;  . CARDIAC CATHETERIZATION N/A 08/06/2016   Procedure: Left Heart Cath and Coronary Angiography;  Surgeon: Jolaine Artist, MD;  Location: [place] [facility];  Service: Cardiovascular;  Laterality: N/A;  . CARDIAC CATHETERIZATION N/A 08/07/2016   Procedure: Coronary/Graft Atherectomy-CSI LAD;  Surgeon: Peter M Martinique, MD;  Location: [place] [facility];  Service: Cardiovascular;  Laterality: N/A;  . CERVICAL SPINE SURGERY  2/09   "to repair nerve problems in my left arm"  . CHOLECYSTECTOMY    . COLONOSCOPY WITH PROPOFOL N/A 04/26/2017   Procedure: COLONOSCOPY WITH PROPOFOL;  Surgeon: Otis Brace, MD;  Location: [place];  Service: Gastroenterology;  Laterality: N/A;  . CORONARY ANGIOPLASTY WITH STENT PLACEMENT  06/11/2008  . CORONARY ANGIOPLASTY WITH STENT PLACEMENT  06/2007   TAXUS stent to RCA/notes 01/31/2011  . ENDOSCOPIC RETROGRADE CHOLANGIOPANCREATOGRAPHY (ERCP)  WITH PROPOFOL N/A 03/31/2019   Procedure: ENDOSCOPIC RETROGRADE CHOLANGIOPANCREATOGRAPHY (ERCP) WITH PROPOFOL;  Surgeon: Clarene Essex, MD;  Location: [place];  Service: Endoscopy;  Laterality: N/A;  . ERCP N/A 11/14/2018   Procedure: ENDOSCOPIC RETROGRADE CHOLANGIOPANCREATOGRAPHY (ERCP);  Surgeon: Ronnette Juniper, MD;  Location: [place];  Service: Gastroenterology;  Laterality: N/A;  . ERCP N/A 05/20/2019   Procedure: ENDOSCOPIC RETROGRADE CHOLANGIOPANCREATOGRAPHY (ERCP);  Surgeon: Clarene Essex, MD;  [facility];  Service: Endoscopy;  Laterality: N/A;  . ERCP N/A 08/03/2019   Procedure: ENDOSCOPIC RETROGRADE CHOLANGIOPANCREATOGRAPHY (ERCP);  Surgeon: Arta Silence, MD;  Location: [facility] ENDOSCOPY;  Service: Endoscopy;  Laterality: N/A;  . ERCP N/A 09/07/2019   Procedure: ENDOSCOPIC RETROGRADE CHOLANGIOPANCREATOGRAPHY (ERCP);  Surgeon: Clarene Essex, MD;  Location: [facility];  Service: Endoscopy;  Laterality: N/A;  . ESOPHAGOGASTRODUODENOSCOPY  09/28/2011   Procedure: ESOPHAGOGASTRODUODENOSCOPY (EGD);  Surgeon: Missy Sabins, MD;  Location: [facility] ENDOSCOPY;  Service: Endoscopy;  Laterality: N/A;  . ESOPHAGOGASTRODUODENOSCOPY N/A 04/07/2015   Procedure: ESOPHAGOGASTRODUODENOSCOPY (EGD);  Surgeon: Teena Irani, MD;  Location: [facility];  Service: Endoscopy;  Laterality: N/A;  . ESOPHAGOGASTRODUODENOSCOPY N/A 04/19/2015   Procedure: ESOPHAGOGASTRODUODENOSCOPY (EGD);  Surgeon: Arta Silence, MD;  Location:  [place] ENDOSCOPY;  Service: Endoscopy;  Laterality: N/A;  . ESOPHAGOGASTRODUODENOSCOPY  11/14/2018   Procedure: ESOPHAGOGASTRODUODENOSCOPY (EGD);  Surgeon: Ronnette Juniper, MD;  Location: [facility] ENDOSCOPY;  Service: Gastroenterology;;  . ESOPHAGOGASTRODUODENOSCOPY (EGD) WITH PROPOFOL N/A 06/13/2018   Procedure: ESOPHAGOGASTRODUODENOSCOPY (EGD) WITH PROPOFOL;  Surgeon: Wonda Horner, MD;  Location: [facility] ENDOSCOPY;  Service: Endoscopy;  Laterality: N/A;  . FLEXIBLE SIGMOIDOSCOPY N/A 05/21/2017   Procedure:  Conni Elliot;  Surgeon: Clarene Essex, MD;  Location: [place];  Service: Endoscopy;  Laterality: N/A;  . FLEXIBLE SIGMOIDOSCOPY Left 07/02/2017   Procedure: FLEXIBLE SIGMOIDOSCOPY;  Surgeon: Laurence Spates, MD;  Location: [place];  Service: Endoscopy;  Laterality: Left;  . FOREIGN BODY REMOVAL  09/2003   via upper endoscopy/notes 02/12/2011  . GIVENS CAPSULE STUDY  09/30/2011   Procedure: GIVENS CAPSULE STUDY;  Surgeon: Jeryl Columbia, MD;  Location: [facility] ENDOSCOPY;  Service: Endoscopy;  Laterality: N/A;  . INSERTION OF DIALYSIS CATHETER Right 2014  . INSERTION OF DIALYSIS CATHETER Left 02/11/2013   Procedure: INSERTION OF DIALYSIS CATHETER;  Surgeon: Conrad Endicott, MD;  Location: [place];  Service: Vascular;  Laterality: Left;  Ultrasound guided  . IR AV DIALY SHUNT INTRO NEEDLE/INTRACATH INITIAL W/PTA/IMG RIGHT Right 11/14/2018  . IR US GUIDE VASC ACCESS RIGHT  11/14/2018  . LUMBAR LAMINECTOMY/DECOMPRESSION MICRODISCECTOMY Bilateral 07/31/2017   Procedure: LAMINECTOMY AND FORAMINOTOMY- BILATERAL LUMBAR TWO- LUMBAR THREE;  Surgeon: Earnie Larsson, MD;  Location: [place];  Service: Neurosurgery;  Laterality: Bilateral;  LAMINECTOMY AND FORAMINOTOMY- BILATERAL LUMBAR 2- LUMBAR 3  . PERIPHERAL VASCULAR BALLOON ANGIOPLASTY  04/07/2018   Procedure: PERIPHERAL VASCULAR BALLOON ANGIOPLASTY;  Surgeon: Waynetta Sandy, MD;  Location: [place] [facility];  Service: Cardiovascular;;  RUE AVF  . REMOVAL OF A DIALYSIS CATHETER Right 02/11/2013   Procedure: REMOVAL OF A DIALYSIS CATHETER;  Surgeon: Conrad [place], MD;  Location: [place];  Service: Vascular;  Laterality: Right;  . REMOVAL OF STONES  11/14/2018   Procedure: REMOVAL OF STONES;  Surgeon: Ronnette Juniper, MD;  Location: [facility] ENDOSCOPY;  Service: Gastroenterology;;  . REMOVAL OF STONES  03/31/2019   Procedure: REMOVAL OF STONES;  Surgeon: Clarene Essex, MD;  Location: [place];  Service: Endoscopy;;  . REMOVAL OF STONES  08/03/2019    Procedure: REMOVAL OF STONES;  Surgeon: Arta Silence, MD;  Location: [facility] ENDOSCOPY;  Service: Endoscopy;;  . REMOVAL OF STONES  09/07/2019   Procedure: REMOVAL OF STONES;  Surgeon: Clarene Essex, MD;  [facility];  Service: Endoscopy;;  . Azzie Almas DILATION N/A 04/07/2015   Procedure: Azzie Almas DILATION;  Surgeon: Teena Irani, MD;  [facility];  Service: Endoscopy;  Laterality: N/A;  . SHUNTOGRAM N/A 09/20/2011   Procedure: Earney Mallet;  Surgeon: Conrad Hertford, MD;  Location: [facility] CATH LAB;  Service: Cardiovascular;  Laterality: N/A;  . SPHINCTEROTOMY  11/14/2018   Procedure: SPHINCTEROTOMY;  Surgeon: Ronnette Juniper, MD;  Location: [facility] ENDOSCOPY;  Service: Gastroenterology;;  . Joan Mayans  03/31/2019   Procedure: SPHINCTEROTOMY;  Surgeon: Clarene Essex, MD;  Location: [facility] ENDOSCOPY;  Service: Endoscopy;;  . SPHINCTEROTOMY  05/20/2019   Procedure: Joan Mayans;  Surgeon: Clarene Essex, MD;  [facility];  Service: Endoscopy;;  . Joan Mayans  09/07/2019   Procedure: SPHINCTEROTOMY;  Surgeon: Clarene Essex, MD;  [facility];  Service: Endoscopy;;  . SPYGLASS CHOLANGIOSCOPY N/A 05/20/2019   Procedure: PZWCHENI CHOLANGIOSCOPY;  Surgeon: Clarene Essex, MD;  [facility];  Service: Endoscopy;  Laterality: N/A;  . SPYGLASS LITHOTRIPSY N/A 05/20/2019  Procedure: SPYGLASS LITHOTRIPSY;  Surgeon: Clarene Essex, MD;  [facility];  Service: Endoscopy;  Laterality: N/A;  . STENT REMOVAL  05/20/2019   Procedure: STENT REMOVAL;  Surgeon: Clarene Essex, MD;  [facility];  Service: Endoscopy;;  . STENT REMOVAL  08/03/2019   Procedure: STENT REMOVAL;  Surgeon: Arta Silence, MD;  Location: [facility] ENDOSCOPY;  Service: Endoscopy;;  . STENT REMOVAL  09/07/2019   Procedure: STENT REMOVAL;  Surgeon: Clarene Essex, MD;  [facility];  Service: Endoscopy;;  . SVT ABLATION N/A 11/26/2016   Procedure: SVT Ablation;  Surgeon: Will Meredith Leeds, MD;  Location: [place] [facility];  Service: Cardiovascular;  Laterality: N/A;  . TEE WITHOUT CARDIOVERSION N/A 08/25/2018   Procedure: TRANSESOPHAGEAL ECHOCARDIOGRAM (TEE);  Surgeon: Dorothy Spark, MD;  Location: [place];  Service: Cardiovascular;  Laterality: N/A;  . TEE WITHOUT CARDIOVERSION N/A 04/02/2019   Procedure: TRANSESOPHAGEAL ECHOCARDIOGRAM (TEE);  Surgeon: Josue Hector, MD;  Location: [facility] ENDOSCOPY;  Service: Cardiovascular;  Laterality: N/A;  . TONSILLECTOMY    . TOTAL KNEE ARTHROPLASTY Right 08/02/2015   Procedure: TOTAL KNEE ARTHROPLASTY;  Surgeon: Renette Butters, MD;  Location: [place];  Service: Orthopedics;  Laterality: Right;  . VENOGRAM N/A 01/26/2013   Procedure: VENOGRAM;  Surgeon: Angelia Mould, MD;  Location: [facility] CATH LAB;  Service: Cardiovascular;  Laterality: N/A;    Past Medical History:  Diagnosis Date  . Acute encephalopathy 10/04/2018  . Allergic rhinitis, cause unspecified 02/24/2014  . Anemia 06/16/2011  . BENIGN PROSTATIC HYPERTROPHY 10/14/2009  . CAD, NATIVE VESSEL 02/06/2009   a. 06/2007 s/p Taxus DES to the RCA;  b. 08/2016 NSTEMI in setting of SVT/PCI: LM 30ost, LAD 28m (3.0x16 Synergy DES), LCX 46m, OM1 60, RI 40, RCA 70p/m, 60m - not amenable to PCI.  [name] Cervical radiculopathy, chronic 02/23/2016   Right c5-6 by NCS/EMG  . Chest pain 08/09/2015  . Chronic combined systolic (congestive) and diastolic (congestive) heart failure ([place])    a. 10/2016 Echo: EF 40-45%, Gr2 DD. mildly dil LA.  [name] COLONIC POLYPS, HX OF 10/14/2009  . Dementia ([place]) [id number]  . Depression 09/24/2015  . DIABETES MELLITUS, TYPE II 02/01/2010  . DYSLIPIDEMIA 06/18/2007  . ESRD (end stage renal disease) on dialysis ([place]) 08/04/2010   ESRD due to DM adn HTN, started HD in 2011. As of 2019 has a R arm graft and gets HD on TTS sched  . FOOT PAIN 08/12/2008  . GAIT DISTURBANCE 03/03/2010  . GASTROENTERITIS, VIRAL 10/14/2009  . GERD 06/18/2007  . GOITER, MULTINODULAR 12/26/2007  . GOUT 06/18/2007   . GYNECOMASTIA 07/17/2010  . Hemodialysis access, fistula mature [facility])    Dialysis T-Th-Sa ([place]) Right upper arm fistula  . Hyperlipidemia 10/16/2011  . Hyperparathyroidism, secondary ([place]) 06/16/2011  . HYPERTENSION 06/18/2007  . Hyperthyroidism   . Hypocalcemia 06/07/2010  . Ischemic cardiomyopathy    a. 10/2016 Echo: EF 40-45%.  . Lumbar stenosis with neurogenic claudication   . ONYCHOMYCOSIS, TOENAILS 12/26/2007  . OSA on CPAP 10/16/2011  . Other malaise and fatigue 11/24/2009  . PERIPHERAL NEUROPATHY 06/18/2007  . Prostate cancer ([place])   . PSVT (paroxysmal supraventricular tachycardia) ([place])    a. 34/7425 complicated by NSTEMI;  b. 11/2016 Treated w/ adenosine in ED;  c. 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  [name] PULMONARY NODULE, RIGHT LOWER LOBE 06/08/2009  . Sleep apnea    cpap machine and o2  . TRANSAMINASES, SERUM, ELEVATED 02/01/2010  . Transfusion history  none recent  . Unspecified hypotension 01/30/2010    Medications:  I have reviewed the patient's current medications.  Medications Prior to Admission  Medication Sig Dispense Refill  . acetaminophen (TYLENOL) 325 MG tablet Take 325 mg by mouth every 6 (six) hours as needed for mild pain or headache.     . Alirocumab (PRALUENT) 150 MG/ML SOAJ Inject 1 pen into the skin every 14 (fourteen) days. 6 pen 3  . allopurinol (ZYLOPRIM) 100 MG tablet Take 1 tablet by mouth once daily 90 tablet 0  . aspirin EC 325 MG tablet Take 1 tablet (325 mg total) by mouth daily. 30 tablet 0  . betamethasone dipropionate (DIPROLENE) 0.05 % cream Apply 1 application topically daily as needed (leg sores).    . bismuth subsalicylate (PEPTO BISMOL) 262 MG/15ML suspension Take 30 mLs by mouth every 6 (six) hours as needed for indigestion.    . cinacalcet (SENSIPAR) 90 MG tablet Take 90 mg by mouth 2 (two) times daily.    . citalopram (CELEXA) 10 MG tablet Take 1 tablet (10 mg total) by mouth daily. (Patient taking differently: Take 10 mg by  mouth every evening. ) 90 tablet 3  . colestipol (COLESTID) 1 g tablet Take 3 tablets (3 g total) by mouth daily. 90 tablet 11  . doxercalciferol ([name]) 4 MCG/2ML injection Inject 1 mL (2 mcg total) into the vein Every Tuesday,Thursday,and Saturday with dialysis. 2 mL 0  . ezetimibe (ZETIA) 10 MG tablet Take 10 mg by mouth at bedtime.     . ferric citrate (AURYXIA) 1 GM 210 MG(Fe) tablet Take 420 mg by mouth 3 (three) times daily with meals.     . gabapentin (NEURONTIN) 300 MG capsule TAKE 1 TO 2 CAPSULES BY MOUTH AT BEDTIME AS NEEDED FOR PAIN AND FOR SLEEP 180 capsule 0  . hydroxypropyl methylcellulose / hypromellose (ISOPTO TEARS / GONIOVISC) 2.5 % ophthalmic solution Place 1 drop into both eyes 3 (three) times daily as needed for dry eyes. 15 mL 11  . methimazole (TAPAZOLE) 5 MG tablet Take 1/2 (one-half) tablet by mouth once daily 45 tablet 0  . midodrine (PROAMATINE) 10 MG tablet Take 10 mg by mouth every Thursday with hemodialysis.     [name] multivitamin (RENA-VIT) TABS tablet Take 1 tablet by mouth daily.     . pantoprazole (PROTONIX) 40 MG tablet Take 40 mg by mouth at bedtime.     . polyethylene glycol (MIRALAX / GLYCOLAX) 17 g packet Take 17 g by mouth daily as needed for mild constipation.    . Vitamin D, Ergocalciferol, (DRISDOL) 1.25 MG (50000 UT) CAPS capsule Take 1 capsule (50,000 Units total) by mouth every 7 (seven) days. (Patient taking differently: Take 50,000 Units by mouth every Wednesday. ) 12 capsule 0  . zolpidem (AMBIEN) 5 MG tablet Take 1 tablet (5 mg total) by mouth at bedtime as needed. for sleep (Patient taking differently: Take 5 mg by mouth at bedtime as needed for sleep. ) 90 tablet 1    ALLERGIES:   Allergies  Allergen Reactions  . Statins Other (See Comments)    Weak muscles  . Ciprofloxacin Rash    FAM HX: Family History  Problem Relation Age of Onset  . Heart disease Sister   . Thyroid nodules Sister   . Heart disease Father   . Diabetes Father   .  Kidney failure Father   . Hypertension Father   . Healthy Child   . Healthy Child   . Healthy  Child   . Cancer Neg Hx     Social History:   reports that he quit smoking about 14 years ago. His smoking use included cigarettes. He has a 25.00 pack-year smoking history. He quit smokeless tobacco use about 14 years ago. He reports that he does not drink alcohol or use drugs.  ROS: unable to obtain from intubated sedated pt  Blood pressure (!) 96/38, pulse (!) 123, temperature (!) 103 F (39.4 C), temperature source Axillary, resp. rate 18, height 5\' 6"  (1.676 m), SpO2 95 %. PHYSICAL EXAM: Gen: obese, intubated and sedate  Eyes:  anicteric ENT: ETT and OG in place Neck: thick CV:  tachycardic Abd:  Soft, obese Lungs: coarse ant, on vent 70% FiO2 GU: no foley Extr:  No edema Neuro: sedated Skin: cool and dry   Results for orders placed or performed during the hospital encounter of 11/29/2019 (from the past 48 hour(s))  Comprehensive metabolic panel     Status: Abnormal   Collection Time: 11/14/2019  5:00 PM  Result Value Ref Range   Sodium 141 135 - 145 mmol/L   Potassium 4.2 3.5 - 5.1 mmol/L   Chloride 95 (L) 98 - 111 mmol/L   CO2 29 22 - 32 mmol/L   Glucose, Bld 213 (H) 70 - 99 mg/dL   BUN 43 (H) 8 - 23 mg/dL   Creatinine, Ser 8.73 (H) 0.61 - 1.24 mg/dL   Calcium 8.8 (L) 8.9 - 10.3 mg/dL   Total Protein 7.7 6.5 - 8.1 g/dL   Albumin 3.3 (L) 3.5 - 5.0 g/dL   AST 200 (H) 15 - 41 U/L   ALT 231 (H) 0 - 44 U/L   Alkaline Phosphatase 271 (H) 38 - 126 U/L   Total Bilirubin 3.2 (H) 0.3 - 1.2 mg/dL   GFR calc non Af Amer 6 (L) >60 mL/min   GFR calc Af Amer 6 (L) >60 mL/min   Anion gap 17 (H) 5 - 15    Comment: Performed at [facility], [address] [place], [place] [address]  Lipase, blood     Status: None   Collection Time: 11/12/2019  5:00 PM  Result Value Ref Range   Lipase 34 11 - 51 U/L    Comment: Performed at [facility] [address] [place], [place]  [address]  Lactic acid, plasma     Status: Abnormal   Collection Time: 11/18/2019  5:00 PM  Result Value Ref Range   Lactic Acid, Venous 2.4 (HH) 0.5 - 1.9 mmol/L    Comment: CRITICAL RESULT CALLED TO, READ BACK BY AND VERIFIED WITH: MEEKS L,RN 11/26/2019 1745 WAYK Performed at [facility], [place] [address] [place], [place] [address]   CBC with Differential     Status: Abnormal   Collection Time: 11/23/2019  5:00 PM  Result Value Ref Range   WBC 7.7 4.0 - 10.5 K/uL   RBC 3.74 (L) 4.22 - 5.81 MIL/uL   Hemoglobin 11.2 (L) 13.0 - 17.0 g/dL   HCT 37.0 (L) 39.0 - 52.0 %   MCV 98.9 80.0 - 100.0 fL   MCH 29.9 26.0 - 34.0 pg   MCHC 30.3 30.0 - 36.0 g/dL   RDW 18.6 (H) 11.5 - 15.5 %   Platelets 303 150 - 400 K/uL   nRBC 0.0 0.0 - 0.2 %   Neutrophils Relative % 91 %   Neutro Abs 7.1 1.7 - 7.7 K/uL   Lymphocytes Relative 4 %  Lymphs Abs 0.3 (L) 0.7 - 4.0 K/uL   Monocytes Relative 4 %   Monocytes Absolute 0.3 0.1 - 1.0 K/uL   Eosinophils Relative 0 %   Eosinophils Absolute 0.0 0.0 - 0.5 K/uL   Basophils Relative 0 %   Basophils Absolute 0.0 0.0 - 0.1 K/uL   Immature Granulocytes 1 %   Abs Immature Granulocytes 0.04 0.00 - 0.07 K/uL    Comment: Performed at [facility] [address], [place], [place] [address]  Protime-INR     Status: None   Collection Time: 11/04/2019  5:00 PM  Result Value Ref Range   Prothrombin Time 13.4 11.4 - 15.2 seconds   INR 1.0 0.8 - 1.2    Comment: (NOTE) INR goal varies based on device and disease states. Performed at [facility], [place] [address], [place], [place] [address]   Type and screen [place]     Status: None   Collection Time: 11/13/2019  5:00 PM  Result Value Ref Range   ABO/RH(D) O POS    Antibody Screen NEG    Sample Expiration      11/08/2019,2359 Performed at [facility], [place] [address] [place],  [address]   Respiratory Panel by RT PCR (Flu A&B, Covid) - Nasopharyngeal Swab     Status: None    Collection Time: 11/06/2019  9:42 PM   Specimen: Nasopharyngeal Swab  Result Value Ref Range   SARS Coronavirus 2 by RT PCR NEGATIVE NEGATIVE    Comment: (NOTE) SARS-CoV-2 target nucleic acids are NOT DETECTED. The SARS-CoV-2 RNA is generally detectable in upper respiratoy specimens during the acute phase of infection. The lowest concentration of SARS-CoV-2 viral copies this assay can detect is 131 copies/mL. A negative result does not preclude SARS-Cov-2 infection and should not be used as the sole basis for treatment or other patient management decisions. A negative result may occur with  improper specimen collection/handling, submission of specimen other than nasopharyngeal swab, presence of viral mutation(s) within the areas targeted by this assay, and inadequate number of viral copies (<131 copies/mL). A negative result must be combined with clinical observations, patient history, and epidemiological information. The expected result is Negative. Fact Sheet for Patients:  PinkCheek.be Fact Sheet for Healthcare Providers:  GravelBags.it This test is not yet ap proved or cleared by the [place] FDA and  has been authorized for detection and/or diagnosis of SARS-CoV-2 by FDA under an Emergency Use Authorization (EUA). This EUA will remain  in effect (meaning this test can be used) for the duration of the COVID-19 declaration under Section 564(b)(1) of the Act, 21 U.S.C. section 360bbb-3(b)(1), unless the authorization is terminated or revoked sooner.    Influenza A by PCR NEGATIVE NEGATIVE   Influenza B by PCR NEGATIVE NEGATIVE    Comment: (NOTE) The Xpert Xpress SARS-CoV-2/FLU/RSV assay is intended as an aid in  the diagnosis of influenza from Nasopharyngeal swab specimens and  should not be used as a sole basis for treatment. Nasal washings and  aspirates are unacceptable for Xpert Xpress SARS-CoV-2/FLU/RSV   testing. Fact Sheet for Patients: PinkCheek.be Fact Sheet for Healthcare Providers: GravelBags.it This test is not yet approved or cleared by the [place] FDA and  has been authorized for detection and/or diagnosis of SARS-CoV-2 by  FDA under an Emergency Use Authorization (EUA). This EUA will remain  in effect (meaning this test can be used) for the duration of the  Covid-19 declaration under  Section 564(b)(1) of the Act, 21  U.S.C. section 360bbb-3(b)(1), unless the authorization is  terminated or revoked. Performed at [facility], [place] [address], [place], [place] [address]   Lactic acid, plasma     Status: Abnormal   Collection Time: 11/06/2019 10:16 PM  Result Value Ref Range   Lactic Acid, Venous 3.0 (HH) 0.5 - 1.9 mmol/L    Comment: CRITICAL VALUE NOTED.  VALUE IS CONSISTENT WITH PREVIOUSLY REPORTED AND CALLED VALUE. Performed at [facility], [place] [address] [place], [place] [address]   MRSA PCR Screening     Status: None   Collection Time: 11/23/2019 11:09 PM   Specimen: Nasal Mucosa; Nasopharyngeal  Result Value Ref Range   MRSA by PCR NEGATIVE NEGATIVE    Comment:        The GeneXpert MRSA Assay (FDA approved for NASAL specimens only), is one component of a comprehensive MRSA colonization surveillance program. It is not intended to diagnose MRSA infection nor to guide or monitor treatment for MRSA infections. Performed at [facility], [place] [address], [place], [place] [address]   Glucose, capillary     Status: Abnormal   Collection Time: 11/28/2019 11:35 PM  Result Value Ref Range   Glucose-Capillary 194 (H) 70 - 99 mg/dL   Comment 1 Notify RN    Comment 2 Document in Chart   Glucose, capillary     Status: Abnormal   Collection Time: 11/06/19  3:25 AM  Result Value Ref Range   Glucose-Capillary 160 (H) 70 - 99 mg/dL  Lactic acid, plasma     Status: Abnormal   Collection Time:  11/06/19  3:35 AM  Result Value Ref Range   Lactic Acid, Venous 7.4 (HH) 0.5 - 1.9 mmol/L    Comment: CRITICAL VALUE NOTED.  VALUE IS CONSISTENT WITH PREVIOUSLY REPORTED AND CALLED VALUE. Performed at [facility], [place] [address], [place], [place] [address]   CBC with Differential/Platelet     Status: Abnormal   Collection Time: 11/06/19  3:35 AM  Result Value Ref Range   WBC 3.4 (L) 4.0 - 10.5 K/uL   RBC 3.54 (L) 4.22 - 5.81 MIL/uL   Hemoglobin 10.7 (L) 13.0 - 17.0 g/dL   HCT 35.1 (L) 39.0 - 52.0 %   MCV 99.2 80.0 - 100.0 fL   MCH 30.2 26.0 - 34.0 pg   MCHC 30.5 30.0 - 36.0 g/dL   RDW 18.8 (H) 11.5 - 15.5 %   Platelets 181 150 - 400 K/uL   nRBC 0.0 0.0 - 0.2 %   Neutrophils Relative % 62 %   Neutro Abs 2.3 1.7 - 7.7 K/uL   Band Neutrophils 5 %   Lymphocytes Relative 24 %   Lymphs Abs 0.8 0.7 - 4.0 K/uL   Monocytes Relative 2 %   Monocytes Absolute 0.1 0.1 - 1.0 K/uL   Eosinophils Relative 5 %   Eosinophils Absolute 0.2 0.0 - 0.5 K/uL   Basophils Relative 0 %   Basophils Absolute 0.0 0.0 - 0.1 K/uL   WBC Morphology See Note     Comment: Increased Bands. >20% Bands   nRBC 0 0 /100 WBC   Metamyelocytes Relative 2 %   Abs Immature Granulocytes 0.10 (H) 0.00 - 0.07 K/uL   Polychromasia PRESENT     Comment: Performed at [facility] [address] [place], [place] [address]  Comprehensive metabolic panel     Status: Abnormal   Collection Time:  11/06/19  3:35 AM  Result Value Ref Range   Sodium 141 135 - 145 mmol/L   Potassium 4.9 3.5 - 5.1 mmol/L   Chloride 99 98 - 111 mmol/L   CO2 22 22 - 32 mmol/L   Glucose, Bld 158 (H) 70 - 99 mg/dL   BUN 54 (H) 8 - 23 mg/dL   Creatinine, Ser 10.27 (H) 0.61 - 1.24 mg/dL   Calcium 8.4 (L) 8.9 - 10.3 mg/dL   Total Protein 6.5 6.5 - 8.1 g/dL   Albumin 2.7 (L) 3.5 - 5.0 g/dL   AST 148 (H) 15 - 41 U/L   ALT 193 (H) 0 - 44 U/L   Alkaline Phosphatase 251 (H) 38 - 126 U/L   Total Bilirubin 4.0 (H) 0.3 - 1.2 mg/dL   GFR calc  non Af Amer 5 (L) >60 mL/min   GFR calc Af Amer 5 (L) >60 mL/min   Anion gap 20 (H) 5 - 15    Comment: Performed at [facility], [address] [place], [place] [address]  Lipase, blood     Status: None   Collection Time: 11/06/19  3:35 AM  Result Value Ref Range   Lipase 29 11 - 51 U/L    Comment: Performed at [facility] [address], [place], [place] [address]  Ammonia     Status: Abnormal   Collection Time: 11/06/19  3:35 AM  Result Value Ref Range   Ammonia 39 (H) 9 - 35 umol/L    Comment: Performed at [facility], [place] [address], [place], [place] [address]  Blood gas, arterial     Status: Abnormal   Collection Time: 11/06/19  3:47 AM  Result Value Ref Range   FIO2 44.00    pH, Arterial 7.433 7.350 - 7.450   pCO2 arterial 34.2 32.0 - 48.0 mmHg   pO2, Arterial 61.2 (L) 83.0 - 108.0 mmHg   Bicarbonate 22.5 20.0 - 28.0 mmol/L   Acid-base deficit 1.2 0.0 - 2.0 mmol/L   O2 Saturation 90.6 %   Patient temperature 37.0    Collection site LEFT RADIAL    Drawn by [id number]     Comment: COLLECTED BY RT   Sample type ARTERIAL DRAW    Allens test (pass/fail) PASS PASS    Comment: Performed at [facility], [place] [address], [place], [place] [address]  TSH     Status: None   Collection Time: 11/06/19  6:24 AM  Result Value Ref Range   TSH 0.995 0.350 - 4.500 uIU/mL    Comment: Performed by a 3rd Generation assay with a functional sensitivity of <=0.01 uIU/mL. Performed at [facility], [place] [address] [place], [place] [address]   Hemoglobin A1c     Status: Abnormal   Collection Time: 11/06/19  6:24 AM  Result Value Ref Range   Hgb A1c MFr Bld 6.4 (H) 4.8 - 5.6 %    Comment: (NOTE) Pre diabetes:          5.7%-6.4% Diabetes:              >6.4% Glycemic control for   <7.0% adults with diabetes    Mean Plasma Glucose 136.98 mg/dL    Comment: Performed at [facility] [address], [place], [place] [address]  DIC (disseminated intravasc  coag) panel     Status: Abnormal   Collection Time: 11/06/19  6:24 AM  Result Value Ref Range  Prothrombin Time 16.3 (H) 11.4 - 15.2 seconds   INR 1.3 (H) 0.8 - 1.2    Comment: (NOTE) INR goal varies based on device and disease states.    aPTT 34 24 - 36 seconds   Fibrinogen 759 (H) 210 - 475 mg/dL   D-Dimer, Quant 10.26 (H) 0.00 - 0.50 ug/mL-FEU    Comment: (NOTE) At the manufacturer cut-off of 0.50 ug/mL FEU, this assay has been documented to exclude PE with a sensitivity and negative predictive value of 97 to 99%.  At this time, this assay has not been approved by the FDA to exclude DVT/VTE. Results should be correlated with clinical presentation.    Platelets 172 150 - 400 K/uL   Smear Review NO SCHISTOCYTES SEEN     Comment: Performed at [facility], [place] [address] [place], [place] [address]  Lactic acid, plasma     Status: Abnormal   Collection Time: 11/06/19  6:24 AM  Result Value Ref Range   Lactic Acid, Venous 8.4 (HH) 0.5 - 1.9 mmol/L    Comment: CRITICAL VALUE NOTED.  VALUE IS CONSISTENT WITH PREVIOUSLY REPORTED AND CALLED VALUE. Performed at [facility], [place] [address], [place], [place] [address]   Cortisol     Status: None   Collection Time: 11/06/19  6:24 AM  Result Value Ref Range   Cortisol, Plasma 93.9 ug/dL    Comment: RESULTS CONFIRMED BY MANUAL DILUTION (NOTE) AM    6.7 - 22.6 ug/dL PM   <10.0       ug/dL Performed at [facility] [address], [place], [place] [address]   Glucose, capillary     Status: None   Collection Time: 11/06/19  8:58 AM  Result Value Ref Range   Glucose-Capillary 85 70 - 99 mg/dL   *Note: Due to a large number of results and/or encounters for the requested time period, some results have not been displayed. A complete set of results can be found in Results Review.    DG Chest 1 View  Result Date: 11/06/2019 CLINICAL DATA:  Increased shortness of breath. EXAM: CHEST  1 VIEW COMPARISON:  CT 11/25/2019.   Chest x-ray 07/31/2019. FINDINGS: Cardiomegaly. Bilateral interstitial prominence. Tiny bilateral pleural effusions cannot be excluded. No pneumothorax. Prior cervical spine fusion. IMPRESSION: Cardiomegaly with bilateral interstitial prominence suggesting CHF. Pneumonitis cannot be excluded. Tiny bilateral pleural effusions cannot be excluded. Electronically Signed   By: Marcello Moores  Register   On: 11/06/2019 05:20   CT HEAD WO CONTRAST  Result Date: 11/06/2019 CLINICAL DATA:  69 year old male with encephalopathy. EXAM: CT HEAD WITHOUT CONTRAST TECHNIQUE: Contiguous axial images were obtained from the base of the skull through the vertex without intravenous contrast. COMPARISON:  Head CT 07/31/2019 and earlier. FINDINGS: Brain: Intermittent mild motion artifact. Stable cerebral volume. No midline shift, ventriculomegaly, mass effect, evidence of mass lesion, intracranial hemorrhage or evidence of cortically based acute infarction. Stable gray-white matter differentiation throughout the brain. No cortical encephalomalacia identified Vascular: Extensive Calcified atherosclerosis at the skull base. There appears to be a degree of intravascular contrast present, a chest CTA was performed earlier tonight. No abnormal enhancement identified. Skull: No acute osseous abnormality identified. Sinuses/Orbits: Visualized paranasal sinuses and mastoids are stable and well pneumatized. Other: Stable orbit and scalp soft tissues. IMPRESSION: 1. Mild motion artifact and some residual intravascular contrast from the earlier CTA chest. 2. Stable CT appearance of the brain since October with no acute intracranial abnormality. Electronically Signed  By: Genevie Ann M.D.   On: 11/06/2019 05:14   DG CHEST PORT 1 VIEW  Result Date: 11/06/2019 CLINICAL DATA:  Intubation. EXAM: PORTABLE CHEST 1 VIEW COMPARISON:  Prior chest x-ray same day.  CT 11/27/2019. FINDINGS: Interim intubation. Endotracheal tube tip 3.5 cm above the carina. Interim  placement of NG tube, its tip is below the left hemidiaphragm. Stable cardiomegaly. Diffuse bilateral interstitial prominence again noted suggesting interstitial edema. Pneumonitis cannot be excluded. Left mid lung field subsegmental atelectasis. No pleural effusion noted on this exam. No pneumothorax. Right subclavian stents noted. IMPRESSION: 1. Interim intubation. Endotracheal tube tip 5 cm above the carina. Interim placement of NG tube, its tip is below left hemidiaphragm. 2. Stable cardiomegaly. Diffuse bilateral interstitial prominence again noted suggesting interstitial edema. Pneumonitis cannot be excluded. No pleural effusion noted on this exam. 3.  Left mid lung field subsegmental atelectasis. Electronically Signed   By: Marcello Moores  Register   On: 11/06/2019 07:30   DG Abd Portable 1V  Result Date: 11/06/2019 CLINICAL DATA:  Orogastric tube placement EXAM: PORTABLE ABDOMEN - 1 VIEW COMPARISON:  None. FINDINGS: AP portable supine view of the upper abdomen was obtained for the purposes of orogastric tube localization. Orogastric tube is seen coursing below the diaphragm with distal tip and side port projecting over the expected location of the distal stomach. Nonobstructive bowel gas pattern. IMPRESSION: Orogastric tube tip and side port project over the expected location of the distal stomach. Electronically Signed   By: Davina Poke D.O.   On: 11/06/2019 10:02   EEG adult  Result Date: 11/06/2019 Lora Havens, MD     11/06/2019  9:27 AM Patient Name: ANDREUS CURE MRN: [id number] Epilepsy Attending: Lora Havens Referring Physician/Provider: Dr Zane Herald Date: 11/06/2019 Duration: 32.59 mins Patient history: 69 year old male with new onset focal status epilepticus (left facial twitching as well as left shoulder twitching) in setting of sepsis. EEG to evaluate for seizure. Level of alertness: comatose AEDs during EEG study: Keppra Technical aspects: This EEG study was done with scalp  electrodes positioned according to the 10-20 International system of electrode placement. Electrical activity was acquired at a sampling rate of 500Hz  and reviewed with a high frequency filter of 70Hz  and a low frequency filter of 1Hz . EEG data were recorded continuously and digitally stored. DESCRIPTION: EEG showed continuous generalized 5-6Hz  theta slowing as well as intermittent 2-3Hz  delta slowing. Hyperventilation and photic stimulation were not performed. ABNORMALITY - Continuous slow, generalized IMPRESSION: This study is suggestive of moderate to severe diffuse encephalopathy, non specific to etiology. No seizures or epileptiform discharges were seen throughout the recording. Lora Havens   CT Angio Chest/Abd/Pel for Dissection W and/or W/WO  Result Date: 11/20/2019 CLINICAL DATA:  69 year old male with history of kidney disease and vascular disease. Evaluate for possible dissection. EXAM: CT ANGIOGRAPHY CHEST, ABDOMEN AND PELVIS TECHNIQUE: Multidetector CT imaging through the chest, abdomen and pelvis was performed using the standard protocol during bolus administration of intravenous contrast. Multiplanar reconstructed images and MIPs were obtained and reviewed to evaluate the vascular anatomy. CONTRAST:  144mL OMNIPAQUE IOHEXOL 350 MG/ML SOLN COMPARISON:  CT of the abdomen pelvis dated 07/31/2019. FINDINGS: CTA CHEST FINDINGS Cardiovascular: There is no cardiomegaly or pericardial effusion. Advanced 3 vessel coronary vascular calcification noted. There is advanced calcified and noncalcified atherosclerotic plaque of the aorta. No aneurysmal dilatation or dissection. The origins of the great vessels of the aortic arch appear patent as visualized. Evaluation of the pulmonary arteries is  limited due to streak artifact caused by patient's arms and suboptimal opacification and timing of the contrast. No large or central pulmonary artery embolus identified. There is a stent in the distal right subclavian  vein. There is narrowing of the right innominate vein. Mediastinum/Nodes: There is no hilar or mediastinal adenopathy. The esophagus is grossly unremarkable. No mediastinal fluid collection. Lungs/Pleura: There are bibasilar subpleural densities, likely atelectasis/scarring. No focal consolidation, pleural effusion, or pneumothorax. There is a 5 mm right middle lobe subpleural nodule (series 6 image 39). Musculoskeletal: Multilevel degenerative changes of the spine. No acute osseous pathology. Partially visualized lower cervical ACDF. Review of the MIP images confirms the above findings. CTA ABDOMEN AND PELVIS FINDINGS VASCULAR Aorta: Advanced atherosclerotic disease. No aneurysmal dilatation or dissection. There is a 2.3 cm infrarenal aortic ectasia. Celiac: There is atherosclerotic calcification of the origin of the celiac axis with approximately 50% narrowing. The celiac artery and its major branches remain patent. SMA: There is atherosclerotic calcification of the origin of the SMA. Noncalcified plaques noted in the wall of the proximal SMA. The SMA remains patent. Renals: Atherosclerotic calcification of the origins of the renal arteries. There is severely diminished flow within the renal arteries. IMA: The IMA is patent. Inflow: Advanced atherosclerotic calcification of the iliac arteries. No aneurysmal dilatation or dissection. The iliac arteries are patent. Veins: No obvious venous abnormality within the limitations of this arterial phase study. Review of the MIP images confirms the above findings. NON-VASCULAR No intra-abdominal free air or free fluid. Hepatobiliary: Diffuse fatty infiltration of the liver. There is mild intrahepatic biliary ductal dilatation. Cholecystectomy. There is air within the cystic duct and central biliary trees. Small scattered linear air in the periphery of the liver most likely biliary air. Portal venous air is less likely. There may be sludge in the central CBD (series 7, image  168). Evaluation is very limited due to streak artifact caused by patient's arms. Pancreas: Unremarkable. No pancreatic ductal dilatation or surrounding inflammatory changes. Spleen: Normal in size without focal abnormality. Adrenals/Urinary Tract: The adrenal glands are unremarkable. Atrophic kidneys. Several bilateral renal hypodense lesions measuring up to 2.7 cm on the left. The larger lesions demonstrate fluid attenuation consistent with cysts and the smaller lesions are not characterized. There is no hydronephrosis on either side. Vascular calcifications noted. The visualized ureters are grossly unremarkable. The urinary bladder is collapsed. Stomach/Bowel: Scattered colonic diverticula without active inflammatory changes. There is no bowel obstruction or active inflammation. The appendix is normal. There is a small hiatal hernia. There is a 3.5 x 2.0 cm fatty ingested particle in the distal esophagus concerning for an impacted particle. Lymphatic: No adenopathy. Reproductive: The prostate and seminal vesicles are grossly unremarkable. Several surgical clips or fiducial markers noted in the region of the prostate gland. Other: None Musculoskeletal: Multilevel degenerative changes and lower lumbar fusion. No acute osseous pathology. Review of the MIP images confirms the above findings. IMPRESSION: 1. No CT evidence of aortic aneurysm or dissection. Severe Aortic Atherosclerosis (ICD10-I70.0). 2. Ingested fatty particle in the distal esophagus concerning for a degree of impaction. Clinical correlation is recommended. 3. Cholecystectomy with dilatation of the common bile duct and intrahepatic biliary trees. Some sludge may be present in the distal CBD. 4. Pneumobilia. 5. Colonic diverticulosis. No bowel obstruction or active inflammation. Normal appendix. Electronically Signed   By: Anner Crete M.D.   On: 11/14/2019 20:15   Dialysis Rx:  TTS 180 4hr19min EDW 103kg BFR 450 DFR 800, 2/2.25, AVF 15g mircera  60 q2wks last dose 1/26 venofer 100 qtx hectorol 3 qtx  Assessment/Plan **Septic shock: source not determined yet.  GI does not feel cholangitis.  Culture pending. On pressors, Broad spectrum Abx per primary.    **VDRF: intubated early this AM, currently on FiO2 70%, PEEP 5. CTA no mention of edema, CXR this AM with some interstitial prominence. No I/Os recorded to date.  **ESRD on HD:  TTS = ran 2:30h of 4:15 tx 2/4 prior to presentation.  Post wt 104.6.  No emergent indications for RRT at the moment.  He has AVF and in his current hemodynamically unstable state a temp cath would be required for CRRT.  I plan to watch him for now and hopefully will be more hemodynamically stable tomorrow and can tolerate HD via AVF.  Please let me know if worsening pulm edema occurs and CRRT is desired.  I'd be happy to do so.   **Anemia:  Hb 10.7, hold IV iron in setting of life threatening infection  **Nutrition: enteral nutrition per RD if appropriate.   **AGMA: secondary to shock; support per above.  Last ABG this AM 7.42 / 34 / 61.  Monitor for need for supplemental bicarb prior to RRT  **BMM:  Ca and phos ok for now.  [name] with TIW.  Will monitor phos for now. Resume binders as needed  **AMS: CT head without acute issue, possible seizure --> EEG and neuro eval pending.  Justin Mend 11/06/2019, 10:24 AM

## 2019-11-06 NOTE — Progress Notes (Signed)
[facility] Progress Note  [name] 69 y.o. 26-Sep-1951   Subjective: Intubated. Sedated. Nurse reports nonbloody stool this morning.  Objective: Vital signs: Vitals:   11/06/19 0900 11/06/19 0915  BP: (!) 101/41 (!) 96/38  Pulse: (!) 122 (!) 123  Resp: 18 18  Temp:    SpO2: 95% 95%  T 103  Physical Exam: Gen: intubated, sedated, well-nourished, elderly  HEENT: anicteric sclera CV: RRR Chest: CTA B Abd: nondistended, nontender, +BS, soft Ext: no edema  Lab Results: Recent Labs    11/11/2019 1700 11/06/19 0335  NA 141 141  K 4.2 4.9  CL 95* 99  CO2 29 22  GLUCOSE 213* 158*  BUN 43* 54*  CREATININE 8.73* 10.27*  CALCIUM 8.8* 8.4*   Recent Labs    11/04/2019 1700 11/06/19 0335  AST 200* 148*  ALT 231* 193*  ALKPHOS 271* 251*  BILITOT 3.2* 4.0*  PROT 7.7 6.5  ALBUMIN 3.3* 2.7*   Recent Labs    11/06/2019 1700 11/14/2019 1700 11/06/19 0335 11/06/19 0624  WBC 7.7  --  3.4*  --   NEUTROABS 7.1  --  2.3  --   HGB 11.2*  --  10.7*  --   HCT 37.0*  --  35.1*  --   MCV 98.9  --  99.2  --   PLT 303   < > 181 172   < > = values in this interval not displayed.      Assessment/Plan: Septic shock on broad spectrum antibiotics and pressors. I am not convinced he has ascending cholangitis and do not feel that an ERCP is needed at this time. Question of seizures being evaluated by neurology. Continue supportive care. Will d/w Dr. Watt Climes.   [name] 11/06/2019, 9:58 AM  Questions please call [phone] ID: [name], male   DOB: 20-Jun-1951, 69 y.o.   MRN: [id number]

## 2019-11-06 NOTE — Progress Notes (Signed)
[facility] Progress Note Patient Name: [name] DOB: 10-Nov-1950 MRN: [id number]   Date of Service  11/06/2019  HPI/Events of Note  Notified that unable to get O2 sats on pulse ox hence ABG obtained with PaO2 232 on FiO2 100%/18 PEEP  eICU Interventions  Discussed with bedside RT to come down on PEEP as patient on triple pressors and try titrating down FiO2 as well based on ABGs     Intervention Category Major Interventions: Hypotension - evaluation and management;Respiratory failure - evaluation and management  Judd Lien 11/06/2019, 10:02 PM

## 2019-11-06 NOTE — H&P (Signed)
NAME:  [name], MRN:  [id number], DOB:  10/19/1950, LOS: 1 ADMISSION DATE:  11/09/2019, CONSULTATION DATE:  11/05/18  CHIEF COMPLAINT:  Abdominal pain  Brief History    This is a 69 yo with history as noted belwo presents with abdominal pain that started this morning with nausea but no vomiting. Patient noted to be hypotensive during dailysis with BP of 72/45. On arrival to ED BP noted to be 108/62.     History of present illness    This is a 69 yo with history o recurrent biliary obstruction due to stones, ESRD TTS, dementia, multiple recurrent klebsiella bacteremia, ICM, HLD, OSA on cpap, prostate cancer, peripheral neuropathy, gout, DM, Hyperthyroidism, CVA, and depression who presents with abdominal pain that started this morning with nausea but no vomiting. Patient noted to be hypotensive during dailysis with BP of 72/45. On arrival to ED BP noted to be 108/62. Was initially alert and interactive on arrival. Throughout the evening became progressively more lethargic. This was after some dilaudid and Fentanyl. Thus it was decided that patient should receive narca. No response noted. Also some facial twitching and cheyne stoke breathing noted. CT head attained and results pending. Also neurology consults. Patient hemodynamically unstable and unable to protect ariway and thus patient transferred to ICU during which time A line and central line placed. After which a stable airway will be attempted.   Of note patient with imaging here that was pertinent for dilation of the common bile duct and intrahepatic biliary trees. Some sludge could be present in distal bile duct. GI has been consulted and they recommended possible ERCP in AM.   Past Medical History  ESRD on HD TTS, recurrent klebsiella bacteremia, C.diff colitis, HTN, ICM, HLD, OSA on CPAP, prostate CA s/p radiotherapy, peripheral neuropathy, gout, DM, hyperthyroidism, CVA, depression, colonic polyps.  Significant Hospital Events    2/5//20 admission  Consults:  Nephrology and Neurology  Procedures:  None  Significant Diagnostic Tests:  CT head pending  Micro Data:  Cultures pending.  Antimicrobials:  Zosyn   Interim history/subjective:  Patient admitted on 11/06/19  Objective   Blood pressure (!) 120/58, pulse (!) 108, temperature 98.5 F (36.9 C), temperature source Oral, resp. rate 19, SpO2 95 %.       No intake or output data in the 24 hours ending 11/06/19 0422 There were no vitals filed for this visit.  Examination: General: Patient lethargic and difficult to arouse HENT: Dry mucous membranes Lungs: With no adventitious lung sounds. Does have cheyne stokes breathing noted Cardiovascular: Regular rate an rhythm.  Abdomen: Soft non tended and non distended Extremities: No gross deformities noted Neuro: Not following commands. Will moan and state name to pain. Will open eyes to pain as well. GCS-7   Resolved Hospital Problem list   NA  Assessment & Plan:  This is a 69 yo with history as noted above who presents for chief complaint of abdominal pain found to have dilation of common bile duct that could consistetn with recurrent stones.    Shock- undifferentiated- hypovolemic, possibly septic (given recurrent biliary obstruction due to stones/ cholangitis) - initial presentation with normal WBC/ afebrile, now febrile P:  tx to ICU  S/p narcan without improvement in hemodynamics or mental status  Emergent femoral CVL / aline placed due to poor access/ acute shock 1L fluid bolus now and reassess, will likely need additional Levophed for MAP goal > 65, adding vasopressin Trend lactate- if continues to elevate will need  to consider repeat imaging Check cortisol and start stress dose steroids  GI consulted in ER- plans to monitor, plans for possible ERCP in am  Trend LFTs Trend WBC/ fever curve  Send Blood cultures (not sent) Continue zosyn  Consider TTE  Acute Hypoxic respiratory  insufficiency- related to encephalopathy +/- possible seizure/ postical state vs worsening shock  Hx COPD/ OSA P:  Will need intubation on ICU arrival  Full MV support ABG/ CXR duonebs prn  VAP   PAD with fentanyl gtt for RASS goal 0/-1  Encephalopathy - ?toxic/ metabolic R/o seizure with right facial twitching - abrupt AMS change around 0300 P:  Neurology consulted by [facility] Pending CTH Serial neuro exams AED per neuro Seizure precautions Checking ammonia/ TSH  ESRD - TTS iHD, last iHD 2/4 P:  Per nephrology Trend BMP/ mag   Chronic HFrEF/ HFpEF, HLD P:  iHD 2/4, hypovolemic on exam Hold home zetia, ASA   DMT2 P:  CBG q 4 SSI    Hyperthyroidism  P:  Check TSH Continue methimazole      Best practice:  Diet: NPO for now Pain/Anxiety/Delirium protocol (if indicated): Fentanyl while intubated VAP protocol (if indicated): HOB at 30 degrees DVT prophylaxis: Heparin for now GI prophylaxis: PPI Glucose control: ISS Mobility: NA Code Status: Full for now. Have confirmed this with wife Family Communication: While discuss with family on file Disposition: TBD  Labs   CBC: Recent Labs  Lab 11/11/2019 1700 11/06/19 0335  WBC 7.7 PENDING  NEUTROABS 7.1 PENDING  HGB 11.2* 10.7*  HCT 37.0* 35.1*  MCV 98.9 99.2  PLT 303 810    Basic Metabolic Panel: Recent Labs  Lab 11/03/2019 1700  NA 141  K 4.2  CL 95*  CO2 29  GLUCOSE 213*  BUN 43*  CREATININE 8.73*  CALCIUM 8.8*   GFR: CrCl cannot be calculated (Unknown ideal weight.). Recent Labs  Lab 11/03/2019 1700 11/20/2019 2216 11/06/19 0335  WBC 7.7  --  PENDING  LATICACIDVEN 2.4* 3.0*  --     Liver Function Tests: Recent Labs  Lab 11/22/2019 1700  AST 200*  ALT 231*  ALKPHOS 271*  BILITOT 3.2*  PROT 7.7  ALBUMIN 3.3*   Recent Labs  Lab 11/19/2019 1700  LIPASE 34   No results for input(s): AMMONIA in the last 168 hours.  ABG    Component Value Date/Time   PHART 7.433 11/06/2019  0347   PCO2ART 34.2 11/06/2019 0347   PO2ART 61.2 (L) 11/06/2019 0347   HCO3 22.5 11/06/2019 0347   TCO2 30 09/07/2019 0737   ACIDBASEDEF 1.2 11/06/2019 0347   O2SAT 90.6 11/06/2019 0347     Coagulation Profile: Recent Labs  Lab 11/22/2019 1700  INR 1.0    Cardiac Enzymes: No results for input(s): CKTOTAL, CKMB, CKMBINDEX, TROPONINI in the last 168 hours.  HbA1C: Hemoglobin A1C  Date/Time Value Ref Range Status  06/01/2019 02:57 PM 6.0 (A) 4.0 - 5.6 % Final  11/19/2018 02:55 PM 6.9 (A) 4.0 - 5.6 % Final   Hgb A1c MFr Bld  Date/Time Value Ref Range Status  08/12/2019 03:11 PM 6.2 4.6 - 6.5 % Final    Comment:    Glycemic Control Guidelines for People with Diabetes:Non Diabetic:  <6%Goal of Therapy: <7%Additional Action Suggested:  >8%   10/04/2018 11:05 AM 6.0 (H) 4.8 - 5.6 % Final    Comment:    (NOTE) Pre diabetes:          5.7%-6.4% Diabetes:              >  6.4% Glycemic control for   <7.0% adults with diabetes     CBG: Recent Labs  Lab 11/29/2019 2335 11/06/19 0325  GLUCAP 194* 160*    Review of Systems:   Unable to attain due to patients mental status  Past Medical History  He,  has a past medical history of Acute encephalopathy (10/04/2018), Allergic rhinitis, cause unspecified (02/24/2014), Anemia (06/16/2011), BENIGN PROSTATIC HYPERTROPHY (10/14/2009), CAD, NATIVE VESSEL (02/06/2009), Cervical radiculopathy, chronic (02/23/2016), Chest pain (08/09/2015), Chronic combined systolic (congestive) and diastolic (congestive) heart failure ([place]), COLONIC POLYPS, HX OF (10/14/2009), Dementia ([place]) ([id number]), Depression (09/24/2015), DIABETES MELLITUS, TYPE II (02/01/2010), DYSLIPIDEMIA (06/18/2007), ESRD (end stage renal disease) on dialysis ([place]) (08/04/2010), FOOT PAIN (08/12/2008), GAIT DISTURBANCE (03/03/2010), GASTROENTERITIS, VIRAL (10/14/2009), GERD (06/18/2007), GOITER, MULTINODULAR (12/26/2007), GOUT (06/18/2007), GYNECOMASTIA (07/17/2010), Hemodialysis access, fistula mature  ([place]), Hyperlipidemia (10/16/2011), Hyperparathyroidism, secondary ([place]) (06/16/2011), HYPERTENSION (06/18/2007), Hyperthyroidism, Hypocalcemia (06/07/2010), Ischemic cardiomyopathy, Lumbar stenosis with neurogenic claudication, ONYCHOMYCOSIS, TOENAILS (12/26/2007), OSA on CPAP (10/16/2011), Other malaise and fatigue (11/24/2009), PERIPHERAL NEUROPATHY (06/18/2007), Prostate cancer ([place] PSVT (paroxysmal supraventricular tachycardia) ([place]), PULMONARY NODULE, RIGHT LOWER LOBE (06/08/2009), Sleep apnea, TRANSAMINASES, SERUM, ELEVATED (02/01/2010), Transfusion history, and Unspecified hypotension (01/30/2010).   Surgical History    Past Surgical History:  Procedure Laterality Date  . A/V SHUNTOGRAM N/A 04/07/2018   Procedure: A/V SHUNTOGRAM;  Surgeon: Waynetta Sandy, MD;  Location: [place] [facility];  Service: Cardiovascular;  Laterality: N/A;  . ARTERIOVENOUS GRAFT PLACEMENT Right 2009   forearm/notes 02/01/2011  . AV FISTULA PLACEMENT  11/07/2011   Procedure: INSERTION OF ARTERIOVENOUS (AV) GORE-TEX GRAFT ARM;  Surgeon: Tinnie Gens, MD;  Location: [place];  Service: Vascular;  Laterality: Left;  . BACK SURGERY  1998  . BALLOON DILATION N/A 11/14/2018   Procedure: BALLOON DILATION;  Surgeon: Ronnette Juniper, MD;  Location: [place];  Service: Gastroenterology;  Laterality: N/A;  . BASCILIC VEIN TRANSPOSITION Right 02/27/2013   Procedure: BASCILIC VEIN TRANSPOSITION;  Surgeon: Mal Misty, MD;  Location: [place];  Service: Vascular;  Laterality: Right;  Right Basilic Vein Transposition   . BILIARY DILATION  03/31/2019   Procedure: BILIARY DILATION;  Surgeon: Clarene Essex, MD;  Location: [facility] ENDOSCOPY;  Service: Endoscopy;;  . BILIARY DILATION  05/20/2019   Procedure: BILIARY DILATION;  Surgeon: Clarene Essex, MD;  Location: [facility] ENDOSCOPY;  Service: Endoscopy;;  . BILIARY STENT PLACEMENT  03/31/2019   Procedure: BILIARY STENT PLACEMENT;  Surgeon: Clarene Essex, MD;  Location: [facility] ENDOSCOPY;  Service: Endoscopy;;  .  BILIARY STENT PLACEMENT N/A 05/20/2019   Procedure: BILIARY STENT PLACEMENT;  Surgeon: Clarene Essex, MD;  Location: [facility] ENDOSCOPY;  Service: Endoscopy;  Laterality: N/A;  . BILIARY STENT PLACEMENT  08/03/2019   Procedure: BILIARY STENT PLACEMENT;  Surgeon: Arta Silence, MD;  Location: [place];  Service: Endoscopy;;  . CARDIAC CATHETERIZATION N/A 08/06/2016   Procedure: Left Heart Cath and Coronary Angiography;  Surgeon: Jolaine Artist, MD;  Location: [name] [facility];  Service: Cardiovascular;  Laterality: N/A;  . CARDIAC CATHETERIZATION N/A 08/07/2016   Procedure: Coronary/Graft Atherectomy-CSI LAD;  Surgeon: Peter M Martinique, MD;  Location: [place] [facility];  Service: Cardiovascular;  Laterality: N/A;  . CERVICAL SPINE SURGERY  2/09   "to repair nerve problems in my left arm"  . CHOLECYSTECTOMY    . COLONOSCOPY WITH PROPOFOL N/A 04/26/2017   Procedure: COLONOSCOPY WITH PROPOFOL;  Surgeon: Otis Brace, MD;  Location: [place];  Service: Gastroenterology;  Laterality: N/A;  . CORONARY ANGIOPLASTY WITH STENT PLACEMENT  06/11/2008  .  CORONARY ANGIOPLASTY WITH STENT PLACEMENT  06/2007   TAXUS stent to RCA/notes 01/31/2011  . ENDOSCOPIC RETROGRADE CHOLANGIOPANCREATOGRAPHY (ERCP) WITH PROPOFOL N/A 03/31/2019   Procedure: ENDOSCOPIC RETROGRADE CHOLANGIOPANCREATOGRAPHY (ERCP) WITH PROPOFOL;  Surgeon: Clarene Essex, MD;  Location: [place];  Service: Endoscopy;  Laterality: N/A;  . ERCP N/A 11/14/2018   Procedure: ENDOSCOPIC RETROGRADE CHOLANGIOPANCREATOGRAPHY (ERCP);  Surgeon: Ronnette Juniper, MD;  Location: [place];  Service: Gastroenterology;  Laterality: N/A;  . ERCP N/A 05/20/2019   Procedure: ENDOSCOPIC RETROGRADE CHOLANGIOPANCREATOGRAPHY (ERCP);  Surgeon: Clarene Essex, MD;  Location[facility];  Service: Endoscopy;  Laterality: N/A;  . ERCP N/A 08/03/2019   Procedure: ENDOSCOPIC RETROGRADE CHOLANGIOPANCREATOGRAPHY (ERCP);  Surgeon: Arta Silence, MD;  Location: [facility] ENDOSCOPY;   Service: Endoscopy;  Laterality: N/A;  . ERCP N/A 09/07/2019   Procedure: ENDOSCOPIC RETROGRADE CHOLANGIOPANCREATOGRAPHY (ERCP);  Surgeon: Clarene Essex, MD;  Location: [facility];  Service: Endoscopy;  Laterality: N/A;  . ESOPHAGOGASTRODUODENOSCOPY  09/28/2011   Procedure: ESOPHAGOGASTRODUODENOSCOPY (EGD);  Surgeon: Missy Sabins, MD;  Location: [facility] ENDOSCOPY;  Service: Endoscopy;  Laterality: N/A;  . ESOPHAGOGASTRODUODENOSCOPY N/A 04/07/2015   Procedure: ESOPHAGOGASTRODUODENOSCOPY (EGD);  Surgeon: Teena Irani, MD;  Location: [facility];  Service: Endoscopy;  Laterality: N/A;  . ESOPHAGOGASTRODUODENOSCOPY N/A 04/19/2015   Procedure: ESOPHAGOGASTRODUODENOSCOPY (EGD);  Surgeon: Arta Silence, MD;  Location: [facility] ENDOSCOPY;  Service: Endoscopy;  Laterality: N/A;  . ESOPHAGOGASTRODUODENOSCOPY  11/14/2018   Procedure: ESOPHAGOGASTRODUODENOSCOPY (EGD);  Surgeon: Ronnette Juniper, MD;  Location: [facility] ENDOSCOPY;  Service: Gastroenterology;;  . ESOPHAGOGASTRODUODENOSCOPY (EGD) WITH PROPOFOL N/A 06/13/2018   Procedure: ESOPHAGOGASTRODUODENOSCOPY (EGD) WITH PROPOFOL;  Surgeon: Wonda Horner, MD;  Location: [facility] ENDOSCOPY;  Service: Endoscopy;  Laterality: N/A;  . FLEXIBLE SIGMOIDOSCOPY N/A 05/21/2017   Procedure: Conni Elliot;  Surgeon: Clarene Essex, MD;  Location: [place];  Service: Endoscopy;  Laterality: N/A;  . FLEXIBLE SIGMOIDOSCOPY Left 07/02/2017   Procedure: FLEXIBLE SIGMOIDOSCOPY;  Surgeon: Laurence Spates, MD;  Location: [place];  Service: Endoscopy;  Laterality: Left;  . FOREIGN BODY REMOVAL  09/2003   via upper endoscopy/notes 02/12/2011  . GIVENS CAPSULE STUDY  09/30/2011   Procedure: GIVENS CAPSULE STUDY;  Surgeon: Jeryl Columbia, MD;  Location: [facility] ENDOSCOPY;  Service: Endoscopy;  Laterality: N/A;  . INSERTION OF DIALYSIS CATHETER Right 2014  . INSERTION OF DIALYSIS CATHETER Left 02/11/2013   Procedure: INSERTION OF DIALYSIS CATHETER;  Surgeon: Conrad Shenandoah, MD;  Location: [place];   Service: Vascular;  Laterality: Left;  Ultrasound guided  . IR AV DIALY SHUNT INTRO NEEDLE/INTRACATH INITIAL W/PTA/IMG RIGHT Right 11/14/2018  . IR US GUIDE VASC ACCESS RIGHT  11/14/2018  . LUMBAR LAMINECTOMY/DECOMPRESSION MICRODISCECTOMY Bilateral 07/31/2017   Procedure: LAMINECTOMY AND FORAMINOTOMY- BILATERAL LUMBAR TWO- LUMBAR THREE;  Surgeon: Earnie Larsson, MD;  Location: [place];  Service: Neurosurgery;  Laterality: Bilateral;  LAMINECTOMY AND FORAMINOTOMY- BILATERAL LUMBAR 2- LUMBAR 3  . PERIPHERAL VASCULAR BALLOON ANGIOPLASTY  04/07/2018   Procedure: PERIPHERAL VASCULAR BALLOON ANGIOPLASTY;  Surgeon: Waynetta Sandy, MD;  Location: [place] [facility];  Service: Cardiovascular;;  RUE AVF  . REMOVAL OF A DIALYSIS CATHETER Right 02/11/2013   Procedure: REMOVAL OF A DIALYSIS CATHETER;  Surgeon: Conrad Cartago, MD;  Location: [place];  Service: Vascular;  Laterality: Right;  . REMOVAL OF STONES  11/14/2018   Procedure: REMOVAL OF STONES;  Surgeon: Ronnette Juniper, MD;  Location: [place];  Service: Gastroenterology;;  . REMOVAL OF STONES  03/31/2019   Procedure: REMOVAL OF STONES;  Surgeon: Clarene Essex, MD;  Location: [facility]  ENDOSCOPY;  Service: Endoscopy;;  . REMOVAL OF STONES  08/03/2019   Procedure: REMOVAL OF STONES;  Surgeon: Arta Silence, MD;  Location: [place] ENDOSCOPY;  Service: Endoscopy;;  . REMOVAL OF STONES  09/07/2019   Procedure: REMOVAL OF STONES;  Surgeon: Clarene Essex, MD;  [facility];  Service: Endoscopy;;  . Azzie Almas DILATION N/A 04/07/2015   Procedure: Azzie Almas DILATION;  Surgeon: Teena Irani, MD;  [facility];  Service: Endoscopy;  Laterality: N/A;  . SHUNTOGRAM N/A 09/20/2011   Procedure: Earney Mallet;  Surgeon: Conrad Alto, MD;  Location: [facility] CATH LAB;  Service: Cardiovascular;  Laterality: N/A;  . SPHINCTEROTOMY  11/14/2018   Procedure: SPHINCTEROTOMY;  Surgeon: Ronnette Juniper, MD;  Location: [facility] ENDOSCOPY;  Service: Gastroenterology;;  . Joan Mayans  03/31/2019    Procedure: SPHINCTEROTOMY;  Surgeon: Clarene Essex, MD;  Location: [facility] ENDOSCOPY;  Service: Endoscopy;;  . SPHINCTEROTOMY  05/20/2019   Procedure: Joan Mayans;  Surgeon: Clarene Essex, MD;  [facility];  Service: Endoscopy;;  . Joan Mayans  09/07/2019   Procedure: SPHINCTEROTOMY;  Surgeon: Clarene Essex, MD;  [facility];  Service: Endoscopy;;  . SPYGLASS CHOLANGIOSCOPY N/A 05/20/2019   Procedure: [id number] CHOLANGIOSCOPY;  Surgeon: Clarene Essex, MD;  [facility];  Service: Endoscopy;  Laterality: N/A;  Bess Kinds LITHOTRIPSY N/A 05/20/2019   Procedure: [id number] LITHOTRIPSY;  Surgeon: Clarene Essex, MD;  [facility];  Service: Endoscopy;  Laterality: N/A;  . STENT REMOVAL  05/20/2019   Procedure: STENT REMOVAL;  Surgeon: Clarene Essex, MD;  [facility];  Service: Endoscopy;;  . STENT REMOVAL  08/03/2019   Procedure: STENT REMOVAL;  Surgeon: Arta Silence, MD;  Location: [facility] ENDOSCOPY;  Service: Endoscopy;;  . STENT REMOVAL  09/07/2019   Procedure: STENT REMOVAL;  Surgeon: Clarene Essex, MD;  [facility];  Service: Endoscopy;;  . SVT ABLATION N/A 11/26/2016   Procedure: SVT Ablation;  Surgeon: Will Meredith Leeds, MD;  Location: [place] [facility];  Service: Cardiovascular;  Laterality: N/A;  . TEE WITHOUT CARDIOVERSION N/A 08/25/2018   Procedure: TRANSESOPHAGEAL ECHOCARDIOGRAM (TEE);  Surgeon: Dorothy Spark, MD;  Location: [place];  Service: Cardiovascular;  Laterality: N/A;  . TEE WITHOUT CARDIOVERSION N/A 04/02/2019   Procedure: TRANSESOPHAGEAL ECHOCARDIOGRAM (TEE);  Surgeon: Josue Hector, MD;  Location: [facility] ENDOSCOPY;  Service: Cardiovascular;  Laterality: N/A;  . TONSILLECTOMY    . TOTAL KNEE ARTHROPLASTY Right 08/02/2015   Procedure: TOTAL KNEE ARTHROPLASTY;  Surgeon: Renette Butters, MD;  Location: [place];  Service: Orthopedics;  Laterality: Right;  . VENOGRAM N/A 01/26/2013   Procedure: VENOGRAM;  Surgeon: Angelia Mould, MD;  Location: [facility] CATH LAB;  Service: Cardiovascular;  Laterality: N/A;     Social History   reports that he quit smoking about 14 years ago. His smoking use included cigarettes. He has a 25.00 pack-year smoking history. He quit smokeless tobacco use about 14 years ago. He reports that he does not drink alcohol or use drugs.   Family History   His family history includes Diabetes in his father; Healthy in his child, child, and child; Heart disease in his father and sister; Hypertension in his father; Kidney failure in his father; Thyroid nodules in his sister. There is no history of Cancer.   Allergies Allergies  Allergen Reactions  . Statins Other (See Comments)    Weak muscles  . Ciprofloxacin Rash     Home Medications  Prior to Admission medications   Medication Sig Start Date End Date Taking? Authorizing  Provider  acetaminophen (TYLENOL) 325 MG tablet Take 325 mg by mouth every 6 (six) hours as needed for mild pain or headache.     [provider]  Alirocumab (PRALUENT) 150 MG/ML SOAJ Inject 1 pen into the skin every 14 (fourteen) days. 10/07/19   Camnitz, Ocie Doyne, MD  allopurinol (ZYLOPRIM) 100 MG tablet Take 1 tablet by mouth once daily 09/18/19   Biagio Borg, MD  aspirin EC 325 MG tablet Take 1 tablet (325 mg total) by mouth daily. 10/10/18   Hongalgi, Lenis Dickinson, MD  betamethasone dipropionate (DIPROLENE) 0.05 % cream Apply 1 application topically daily as needed (leg sores).    [provider]  bismuth subsalicylate (PEPTO BISMOL) 262 MG/15ML suspension Take 30 mLs by mouth every 6 (six) hours as needed for indigestion.    [provider]  cinacalcet (SENSIPAR) 90 MG tablet Take 90 mg by mouth 2 (two) times daily.    [provider]  citalopram (CELEXA) 10 MG tablet Take 1 tablet (10 mg total) by mouth daily. Patient taking differently: Take 10 mg by mouth every evening.  08/12/19 08/11/20  Biagio Borg, MD  colestipol (COLESTID) 1 g  tablet Take 3 tablets (3 g total) by mouth daily. 02/18/19   Renato Shin, MD  doxercalciferol ([name]) 4 MCG/2ML injection Inject 1 mL (2 mcg total) into the vein Every Tuesday,Thursday,and Saturday with dialysis. 09/02/18   Kayleen Memos, DO  ezetimibe (ZETIA) 10 MG tablet Take 10 mg by mouth at bedtime.     [provider]  ferric citrate (AURYXIA) 1 GM 210 MG(Fe) tablet Take 420 mg by mouth 3 (three) times daily with meals.     [provider]  gabapentin (NEURONTIN) 300 MG capsule TAKE 1 TO 2 CAPSULES BY MOUTH AT BEDTIME AS NEEDED FOR PAIN AND FOR SLEEP 09/28/19   Biagio Borg, MD  hydroxypropyl methylcellulose / hypromellose (ISOPTO TEARS / GONIOVISC) 2.5 % ophthalmic solution Place 1 drop into both eyes 3 (three) times daily as needed for dry eyes. 05/03/17   Biagio Borg, MD  methimazole (TAPAZOLE) 5 MG tablet Take 1/2 (one-half) tablet by mouth once daily 10/15/19   Renato Shin, MD  midodrine (PROAMATINE) 10 MG tablet Take 10 mg by mouth every Thursday with hemodialysis.     [provider]  multivitamin (RENA-VIT) TABS tablet Take 1 tablet by mouth daily.     [provider]  pantoprazole (PROTONIX) 40 MG tablet Take 40 mg by mouth at bedtime.  11/03/18   [provider]  polyethylene glycol (MIRALAX / GLYCOLAX) 17 g packet Take 17 g by mouth daily as needed for mild constipation. 04/02/19   Aline August, MD  Vitamin D, Ergocalciferol, (DRISDOL) 1.25 MG (50000 UT) CAPS capsule Take 1 capsule (50,000 Units total) by mouth every 7 (seven) days. Patient taking differently: Take 50,000 Units by mouth every Wednesday.  08/13/19   Biagio Borg, MD  zolpidem (AMBIEN) 5 MG tablet Take 1 tablet (5 mg total) by mouth at bedtime as needed. for sleep Patient taking differently: Take 5 mg by mouth at bedtime as needed for sleep.  07/03/19   Biagio Borg, MD     Critical care time: 60 minutes

## 2019-11-06 NOTE — Progress Notes (Signed)
Dr. Tamala Julian at patient's bedside and gave this RN a verbal order to increase the levophed gtt to 60 at this time

## 2019-11-06 NOTE — Progress Notes (Signed)
RT attempted to notify [facility] MD of critical ABG pH value 7.072. Unable to reach MD at this time. RT will continue to contact MD. RT obtained ABG d/t pts pulse ox not reading consistently. Pts PaO2 at this time on ABG is 232, pt is on 18 PEEP and FIO2 100%. RT will continue to monitor.

## 2019-11-06 NOTE — Progress Notes (Signed)
NAME:  [name], MRN:  [id number], DOB:  1950-10-28, LOS: 1 ADMISSION DATE:  11/29/2019, CONSULTATION DATE:  11/05/18  CHIEF COMPLAINT:  Abdominal pain  Brief History   69 yo male former smoker presented with abdominal pain and nausea.  Hypotensive in ER.  Developed altered mental status after receiving pain medication, and then given narcan w/o response.  Also developed facial twitching with concern for status epilepticus.  Required intubation for airway protection.  Found to have dilated common bile duct with sludge in distal bile duct.  He has hx of recurrent biliary obstructing stones.  Past Medical History  ESRD on HD TTS, recurrent klebsiella bacteremia, C.diff colitis, HTN, ICM, HLD, OSA on CPAP, prostate CA s/p radiotherapy, peripheral neuropathy, gout, DM, hyperthyroidism, CVA, depression, colonic polyps.  Significant Hospital Events   2/05 Admit  Consults:  Nephrology  Neurology Gastroenterology  Procedures:  ETT 2/05 >>  Rt femoral CVL 2/05 >>  Rt radial a line 2/05 >>   Significant Diagnostic Tests:  CT angio chest/abd/pelvis 2/04 >> atherosclerosis with 3V CAD, stent in distal Rt [place] vein, narrowing of innominate vein, bibasilar scarring, 5 mm RML nodule, 50% narrowing of celiac axis, severely diminished flow to renal arteries, fatty liver, mild intrahepatic biliary duct dilation, air in cystic duct and central biliary tree, sludge in CBG, 3.5 x 2 cm particle in distal esophagus concerning for impaction CT head 2/05 >> no acute findings  Micro Data:  SARS CoV2 PCR 2/04 >> negative Influenza PCR 2/04 >> negative Blood 2/05 >>  Sputum 2/05 >>  Antimicrobials:  Zosyn 2/04 >>  Vancomycin 2/05 >>   Interim history/subjective:  Patient admitted on 11/06/19  Objective   Blood pressure (!) 85/33, pulse (!) 115, temperature (!) 103 F (39.4 C), temperature source Axillary, resp. rate 18, height 5\' 6"  (1.676 m), SpO2 96 %.    Vent Mode: PRVC FiO2 (%):  [70 %-80 %] 70  % Set Rate:  [18 bmp] 18 bmp Vt Set:  [510 mL] 510 mL PEEP:  [5 cmH20] 5 cmH20 Plateau Pressure:  [19 cmH20] 19 cmH20  No intake or output data in the 24 hours ending 11/06/19 0813 There were no vitals filed for this visit.  Examination:  General - sedated Eyes - pupils reactive ENT - ETT in place Cardiac - regular, tachycardic Chest - no wheeze Abdomen - soft, decreased BS Extremities - AV fistula in Rt arm Skin - no rashes Neuro - RASS -2   Resolved Hospital Problem list     Assessment & Plan:   Acute hypoxic respiratory failure with compromised airway. Hx of COPD, OSA. - full vent support - f/u CXR, ABG  Septic shock from Biliary source. - continue ABx - GI consulted to assess for ERCP - pressors to keep MAP > 65 - continue solu cortef while on pressors  Acute metabolic encephalopathy from sepsis. Status epilepticus. Hx of depression, dementia. - f/u MRI brain - AEDs per neurology - RASS goal 0 to -1  ESRD, secondary hyperparathyroidism. - nephrology consulted - might need CRRT >> IV access difficult, and might need HD catheter placed by IR  CAD, Chronic combined CHF, HLD. - hold outpt ASA, zetia  DM type II poorly controlled. -  SSI  Hx of hyperthyroidism. - methimazole  Lactic acidosis. - f/u lactic acid level  Anemia of critical illness and chronic disease. - f/u CBC - transfuse for Hb < 7 or significant bleeding  Best practice:  Diet: NPO DVT prophylaxis:  SQ heparing GI prophylaxis: protonix Mobility: bed rest Code Status: Full code Disposition: ICU  Labs    CMP Latest Ref Rng & Units 11/06/2019 11/02/2019 09/07/2019  Glucose 70 - 99 mg/dL 158(H) 213(H) 148(H)  BUN 8 - 23 mg/dL 54(H) 43(H) 71(H)  Creatinine 0.61 - 1.24 mg/dL 10.27(H) 8.73(H) 10.50(H)  Sodium 135 - 145 mmol/L 141 141 140  Potassium 3.5 - 5.1 mmol/L 4.9 4.2 5.2(H)  Chloride 98 - 111 mmol/L 99 95(L) 105  CO2 22 - 32 mmol/L 22 29 -  Calcium 8.9 - 10.3 mg/dL 8.4(L)  8.8(L) -  Total Protein 6.5 - 8.1 g/dL 6.5 7.7 -  Total Bilirubin 0.3 - 1.2 mg/dL 4.0(H) 3.2(H) -  Alkaline Phos 38 - 126 U/L 251(H) 271(H) -  AST 15 - 41 U/L 148(H) 200(H) -  ALT 0 - 44 U/L 193(H) 231(H) -    CBC Latest Ref Rng & Units 11/06/2019 11/06/2019 11/18/2019  WBC 4.0 - 10.5 K/uL - 3.4(L) 7.7  Hemoglobin 13.0 - 17.0 g/dL - 10.7(L) 11.2(L)  Hematocrit 39.0 - 52.0 % - 35.1(L) 37.0(L)  Platelets 150 - 400 K/uL 172 181 303    CBG (last 3)  Recent Labs    11/26/2019 2335 11/06/19 0325  GLUCAP 194* 160*    ABG    Component Value Date/Time   PHART 7.433 11/06/2019 0347   PCO2ART 34.2 11/06/2019 0347   PO2ART 61.2 (L) 11/06/2019 0347   HCO3 22.5 11/06/2019 0347   TCO2 30 09/07/2019 0737   ACIDBASEDEF 1.2 11/06/2019 0347   O2SAT 90.6 11/06/2019 0347    CC time 38 minutes  Chesley Mires, MD [facility] Pulmonary/Critical Care 11/06/2019, 8:41 AM

## 2019-11-06 NOTE — Progress Notes (Signed)
RT obtained ABG on pt with the following results. Pt was placed on HFNC [place] 15 Lpm for decreased sats. When ABG obtained pt was on [place] 6 Lpm w/sats in low 90's. Pt was then placed on NRB 15 Lpm for transport to CT.  RT will continue to monitor.   Results for [name], [name] (MRN [id number]) as of 11/06/2019 04:08  Ref. Range 11/06/2019 03:47  Sample type Unknown ARTERIAL DRAW  FIO2 Unknown 44.00  pH, Arterial Latest Ref Range: 7.350 - 7.450  7.433  pCO2 arterial Latest Ref Range: 32.0 - 48.0 mmHg 34.2  pO2, Arterial Latest Ref Range: 83.0 - 108.0 mmHg 61.2 (L)  Acid-base deficit Latest Ref Range: 0.0 - 2.0 mmol/L 1.2  Bicarbonate Latest Ref Range: 20.0 - 28.0 mmol/L 22.5  O2 Saturation Latest Units: % 90.6  Patient temperature Unknown 37.0  Collection site Unknown LEFT RADIAL  Allens test (pass/fail) Latest Ref Range: PASS  PASS

## 2019-11-06 NOTE — Progress Notes (Signed)
PHARMACY - PHYSICIAN COMMUNICATION CRITICAL VALUE ALERT - BLOOD CULTURE IDENTIFICATION (BCID)  [name] is an 69 y.o. male who presented to [facility] on 11/22/2019 with a chief complaint of abdominal pain.  Possible ischemic gut.  Assessment:  4/4 blood cultures with GNR identified as both Klebsiella oxytoca and Klebsiella pneumonia - KPC negative  Name of physician (or Provider) Contacted: None  Current antibiotics: Zosyn and Vancomycin  Changes to prescribed antibiotics recommended:  Patient is on recommended antibiotics - No changes needed  Results for orders placed or performed during the hospital encounter of 11/23/2019  Blood Culture ID Panel (Reflexed) (Collected: 11/06/2019  8:11 AM)  Result Value Ref Range   Enterococcus species NOT DETECTED NOT DETECTED   Listeria monocytogenes NOT DETECTED NOT DETECTED   Staphylococcus species NOT DETECTED NOT DETECTED   Staphylococcus aureus (BCID) NOT DETECTED NOT DETECTED   Streptococcus species NOT DETECTED NOT DETECTED   Streptococcus agalactiae NOT DETECTED NOT DETECTED   Streptococcus pneumoniae NOT DETECTED NOT DETECTED   Streptococcus pyogenes NOT DETECTED NOT DETECTED   Acinetobacter baumannii NOT DETECTED NOT DETECTED   Enterobacteriaceae species DETECTED (A) NOT DETECTED   Enterobacter cloacae complex NOT DETECTED NOT DETECTED   Escherichia coli NOT DETECTED NOT DETECTED   Klebsiella oxytoca DETECTED (A) NOT DETECTED   Klebsiella pneumoniae DETECTED (A) NOT DETECTED   Proteus species NOT DETECTED NOT DETECTED   Serratia marcescens NOT DETECTED NOT DETECTED   Carbapenem resistance NOT DETECTED NOT DETECTED   Haemophilus influenzae NOT DETECTED NOT DETECTED   Neisseria meningitidis NOT DETECTED NOT DETECTED   Pseudomonas aeruginosa NOT DETECTED NOT DETECTED   Candida albicans NOT DETECTED NOT DETECTED   Candida glabrata NOT DETECTED NOT DETECTED   Candida krusei NOT DETECTED NOT DETECTED   Candida parapsilosis NOT  DETECTED NOT DETECTED   Candida tropicalis NOT DETECTED NOT DETECTED    Corinda Gubler 11/06/2019  8:57 PM

## 2019-11-06 NOTE — Progress Notes (Signed)
Met with son and wife at bedside. Clinical situation discussed in detail. Continue current level of care but if Mr. Paullin takes turn for worse allow him to pass away in peace.  DNR entered.  Should he somehow survive the night this can be revisited.  Fentanyl should be increased for air hunger.   Erskine Emery MD PCCM

## 2019-11-06 NOTE — Significant Event (Signed)
Rapid Response Event Note  Overview: AMS after seizures  Initial Focused Assessment: Pt was lethargic, arousable to noxious stimuli following seizures. [name] APP at bedside to place CVC, Aline. Pt decompensated with BPs 60s. Pt started on Norepi at 10 mcg. Pt transferred to 4N25 following CVC, aline insertion.   Interventions: -NRB mask -Assisted with CVC placement -tx to 4N25    Event Summary: Arrived at call 0500 Call ended 0600 Madelynn Done

## 2019-11-06 NOTE — Progress Notes (Signed)
Spoke with pt's wife at bedside.  Updated about current status.    Explained that he continues to be critically ill in spite of aggressive medical support and that he might not be able to survive this illness.  Discussed goals of care regarding plan if he developed cardiac arrest.  She was not ready to make any decisions regarding this at present.  Time spent 14 minutes.  Chesley Mires, MD [facility] Pulmonary/Critical Care 11/06/2019, 11:57 AM

## 2019-11-06 NOTE — Progress Notes (Signed)
RN checked on pt at 0230 pt was oriented X3. RN and MD went back into pt's room at 0330 and pt was lethargic and only able to ans tell us his name. Pt was responsive to pain. MD ordered stat CT of head, Xray of chest, and Labs. Pt quickly became unresponsive and BP dropped. MD and rapid in the room and pts was started on MEDs per [facility].PT was transferred to the ICU. Wife was called and updated on pt's status. Report was give to ICU nurse.

## 2019-11-06 NOTE — Procedures (Signed)
Central Venous Catheter Insertion Procedure Note [name] [id number] 10/11/1950  Procedure: Insertion of Central Venous Catheter Indications: Assessment of intravascular volume, Drug and/or fluid administration and Frequent blood sampling  Procedure Details Consent: Unable to obtain consent because of emergent medical necessity. Time Out: Verified patient identification, verified procedure, site/side was marked, verified correct patient position, special equipment/implants available, medications/allergies/relevent history reviewed, required imaging and test results available.  Performed  Maximum sterile technique was used including antiseptics, cap, gloves, gown, hand hygiene, mask and sheet. Skin prep: Chlorhexidine; local anesthetic administered 4 ml lido 1% A antimicrobial bonded/coated triple lumen catheter was placed in the right femoral vein due to emergent situation using the Seldinger technique to 20 cm.  Line sutured.  Biopatch and sterile dressing applied.   Evaluation Blood flow good Complications: No apparent complications Patient did tolerate procedure well. Chest X-ray ordered to verify placement.  CXR: n/a.  Procedure performed with ultrasound guidance for real time vessel cannulation.       [name] Rad, MSN, AGACNP-BC [place] Pul[facility] & Critical Care 11/06/2019, 5:45 AM

## 2019-11-06 NOTE — Procedures (Signed)
Patient Name: AWS SHERE  MRN: [id number]  Epilepsy Attending: Lora Havens  Referring Physician/Provider: Dr Zane Herald Date: 11/06/2019 Duration: 32.59 mins  Patient history: 69 year old male with new onset focal status epilepticus (left facial twitching as well as left shoulder twitching) in setting of sepsis. EEG to evaluate for seizure.   Level of alertness: comatose  AEDs during EEG study: Keppra  Technical aspects: This EEG study was done with scalp electrodes positioned according to the 10-20 International system of electrode placement. Electrical activity was acquired at a sampling rate of 500Hz  and reviewed with a high frequency filter of 70Hz  and a low frequency filter of 1Hz . EEG data were recorded continuously and digitally stored.   DESCRIPTION: EEG showed continuous generalized 5-6Hz  theta slowing as well as intermittent 2-3Hz  delta slowing. Hyperventilation and photic stimulation were not performed.  ABNORMALITY - Continuous slow, generalized  IMPRESSION: This study is suggestive of moderate to severe diffuse encephalopathy, non specific to etiology. No seizures or epileptiform discharges were seen throughout the recording.     Sianni Cloninger Barbra Sarks

## 2019-11-06 NOTE — Progress Notes (Addendum)
Called to bedside for worsening blood sugars and acidemia. History reviewed. Patient on extraordinary vent settings, bicarb, and very high dose pressors. Continuing to deteriorate. Abdomen absent BS Bedside echo biventricular failure Too unstable for CRRT Unable to offload acid with respiration alone Likely ischemic gut driving clinical picture This is a non-survivable status. Wife to come to bedside to say goodbye and hopefully allow him to pass in peace.  Erskine Emery MD PCCM

## 2019-11-06 NOTE — Significant Event (Signed)
Initially admission was called for me by the ER physician for abdominal pain CT scan showing dilated CBD ducts.  Labs showing elevated LFTs.  Patient comes with complaints of abdominal pain was initially hypotensive improved with fluids.  Was started on empiric antibiotics for possible sepsis related to cholangitis.  By the time I saw the patient patient became acute encephalopathy hypoxic hypotensive with twitching and left facial area.  Concerning for seizures.  I consulted critical care and neurology.  Patient was given Ativan for possible seizure along with Keppra.  CT head was ordered along with ABG and repeat labs ordered.  Patient was empirically started on antibiotics.  And patient was admitted to ICU care.  [name]

## 2019-11-06 NOTE — Progress Notes (Signed)
Pharmacy Antibiotic Note  [name] is a 69 y.o. male admitted on 11/15/2019 with sepsis.  Pharmacy has been consulted for vancomycin dosing.  Plan: Vancomycin 2gm IV x 1 F/u plans for dialysis     Temp (24hrs), Avg:100.3 F (37.9 C), Min:98.5 F (36.9 C), Max:103 F (39.4 C)  Recent Labs  Lab 11/26/2019 1700 11/09/2019 2216 11/06/19 0335  WBC 7.7  --  3.4*  CREATININE 8.73*  --  10.27*  LATICACIDVEN 2.4* 3.0* 7.4*    CrCl cannot be calculated (Unknown ideal weight.).    Allergies  Allergen Reactions  . Statins Other (See Comments)    Weak muscles  . Ciprofloxacin Rash     Thank you for allowing pharmacy to be a part of this patient's care.  Beverlee Nims 11/06/2019 6:57 AM

## 2019-11-06 NOTE — Procedures (Addendum)
Intubation Procedure Note DAELAN GATT [id number] [birth year]  Procedure: Intubation Indications: Respiratory insufficiency  Procedure Details Consent: Risks of procedure as well as the alternatives and risks of each were explained to the (patient/caregiver).  Consent for procedure obtained. Time Out: Verified patient identification, verified procedure, site/side was marked, verified correct patient position, special equipment/implants available, medications/allergies/relevent history reviewed, required imaging and test results available.  Performed  Pre sedated with fentanyl 100 mcg, etomidate 30 mg, rocuronium 100 mg Patient currently on levophed and vasopressin for shock and IVF bolus   Maximum sterile technique was used including gloves, hand hygiene and mask.  MAC and 4 glidescope Grade 1 view, thick white secretions in the posterior pharynx suctioned  ETT 8 placed at 26 cm, secured. OGT placed as well   Evaluation Hemodynamic Status: BP stable throughout; O2 sats: stable throughout Patient's Current Condition: stable Complications: No apparent complications Patient did tolerate procedure well. Chest X-ray ordered to verify placement.  CXR: pending.  Procedure performed under direct supervision of Dr. Shyrl Numbers, MSN, AGACNP-BC [place] [facility] 11/06/2019, 6:55 AM  Addendum: CXR reviewed, ETT retracted by 2cm, OGT below diaphram

## 2019-11-06 NOTE — Progress Notes (Signed)
Initial Nutrition Assessment  DOCUMENTATION CODES:   Obesity unspecified  INTERVENTION:   Recommend initiate Vital AF 1.2 @ 40 ml/hr (960 ml/day) via OG tube 60 ml Prostat BID  Provides: 1552 kcal, 132 grams protein, and 778 ml free water.    NUTRITION DIAGNOSIS:   Increased nutrient needs related to catabolic illness as evidenced by estimated needs.  GOAL:   Patient will meet greater than or equal to 90% of their needs  MONITOR:   Vent status, I & O's  REASON FOR ASSESSMENT:   Ventilator    ASSESSMENT:   Pt with PMH of ESRD on HD TTS, recurrent klebsiella bacteremia, c.diff, HTN, ICM, HLD, OSA on CPAP, MD, CVA, depression who was admitted with abd pain and nausea. Pt became hypotensive and had sz, intubated for airway protection.   Per MD pt with septic shock source not yet determine, GI does not think it is cholangitis, cultures pending, on Abx.  Per nephrology if remains hemodynamically unstable will plan for CRRT.  EDW: 103 kg   Patient is currently intubated on ventilator support MV: 8.9 L/min Temp (24hrs), Avg:100 F (37.8 C), Min:98.5 F (36.9 C), Max:103 F (39.4 C)  Medications reviewed and include: sensipar, solu-cortef, SSI, keppra Levo @ 40 mcg Vaso @ .03  MAP: 65   Labs reviewed:  Ammonia: 39 (H) Alk Phos: 251 (H) AST: 148 (H) ALT: 193 (H) Lab Results  Component Value Date   HGBA1C 6.4 (H) 11/06/2019     NUTRITION - FOCUSED PHYSICAL EXAM:  Deferred  Diet Order:   Diet Order            Diet NPO time specified  Diet effective now              EDUCATION NEEDS:   No education needs have been identified at this time  Skin:  Skin Assessment: Reviewed RN Assessment  Last BM:  2/5  Height:   Ht Readings from Last 1 Encounters:  11/06/19 5' 6"  (1.676 m)    Weight:   Wt Readings from Last 1 Encounters:  09/07/19 102.1 kg    Ideal Body Weight:  64.5 kg  BMI:  Body mass index is 36.32 kg/m.  Estimated Nutritional  Needs:   Kcal:  1300-1600  Protein:  129-160 grams  Fluid:  1.2 L  Marieke Lubke P., RD, LDN, CNSC See AMiON for contact information

## 2019-11-06 NOTE — Progress Notes (Signed)
The chaplain visited as a result of a request from the family.  The wife of the patient expressed displeasure with the care their loved one was receiving as well as the level of communication concerning her loved ones care that they were getting.  The wife also asked for prayer.  The chaplain supported with prayer and supportive conversation. The chaplain will follow-up if needed.  Brion Aliment Chaplain Resident For questions concerning this note please contact me by pager [phone]

## 2019-11-06 NOTE — Progress Notes (Signed)
Vent setting changes made per telephone order from CCM.  Increased RR to 24 and PEEP to +10

## 2019-11-06 NOTE — Progress Notes (Signed)
Verbal MD order received to collect blood cultures from new a-line and CVC placed this morning

## 2019-11-06 NOTE — Progress Notes (Signed)
EEG complete - results pending 

## 2019-11-07 ENCOUNTER — Inpatient Hospital Stay (HOSPITAL_COMMUNITY): Payer: Medicare Other

## 2019-11-07 LAB — POCT I-STAT 7, (LYTES, BLD GAS, ICA,H+H)
Acid-base deficit: 20 mmol/L — ABNORMAL HIGH (ref 0.0–2.0)
Bicarbonate: 9.8 mmol/L — ABNORMAL LOW (ref 20.0–28.0)
Calcium, Ion: 0.88 mmol/L — CL (ref 1.15–1.40)
HCT: 30 % — ABNORMAL LOW (ref 39.0–52.0)
Hemoglobin: 10.2 g/dL — ABNORMAL LOW (ref 13.0–17.0)
O2 Saturation: 99 %
Patient temperature: 98.8
Potassium: 5.7 mmol/L — ABNORMAL HIGH (ref 3.5–5.1)
Sodium: 140 mmol/L (ref 135–145)
TCO2: 11 mmol/L — ABNORMAL LOW (ref 22–32)
pCO2 arterial: 35.3 mmHg (ref 32.0–48.0)
pH, Arterial: 7.05 — CL (ref 7.350–7.450)
pO2, Arterial: 188 mmHg — ABNORMAL HIGH (ref 83.0–108.0)

## 2019-11-09 LAB — CULTURE, RESPIRATORY W GRAM STAIN: Culture: NORMAL

## 2019-11-09 LAB — CULTURE, BLOOD (ROUTINE X 2)

## 2019-11-09 LAB — GLUCOSE, CAPILLARY: Glucose-Capillary: 11 mg/dL — CL (ref 70–99)

## 2019-11-30 ENCOUNTER — Ambulatory Visit: Payer: Medicare Other | Admitting: Endocrinology

## 2019-11-30 NOTE — Procedures (Signed)
Extubation Procedure Note  Patient Details:   Name: [name] DOB: [birth year] MRN: [id number]   Airway Documentation:    Vent end date: Nov 27, 2019 Vent end time: 0404   Evaluation  O2 sats: stable throughout Complications: No apparent complications Patient did tolerate procedure well. Bilateral Breath Sounds: Clear, Diminished   No  RT compassionately extubated pt to RA as ordered by MD.    [name] 2019/11/27, 4:06 AM

## 2019-11-30 NOTE — Progress Notes (Signed)
118 ml of fentanyl 2500 mcg in 250 NS wasted in sink, witnessed by Karl Luke RN.

## 2019-11-30 NOTE — Progress Notes (Signed)
Asked to evaluate patient for discontinuation of critical care and to institute comfort care.  Family wishes to stop vasopressors and extubate without reintubation.  Will follow the family's wishes.  Discussed with patients wife and son at bedside along with RT and nursing.

## 2019-11-30 NOTE — Death Summary Note (Signed)
[name] was a 69 y.o. male admitted 11/25/2019 with abdominal pain and nausea.  Found to have hypotension in ER.  Developed altered mental status after receiving pain medication, and then given narcan w/o response.  Also developed facial twitching with concern for status epilepticus.  Required intubation for airway protection.  Found to have dilated common bile duct with sludge in distal bile duct.  He has hx of recurrent biliary obstructing stones.  Seen by renal, GI, and neurology.  Had progressive acidosis and concern for ischemic bowel.  Developed worsening shock, acidosis, and hypoglycemia.  Discussed with pt's family and changed to DNR and transition to comfort measures.  He was extubated on November 12, 2019 and expired at 4:06 AM.  Final diagnoses: Acute hypoxic, hypercapnic respiratory failure with compromised airway History of COPD and OSA Septic shock from biliary source present on admission Klebsiella pneumoniae bacteremia Ischemic colitis Anion gap metabolic acidosis with lactic acidosis Acute metabolic encephalopathy 2nd to sepsis Status epilepticus History of depression and dementia ESRD Secondary hyperparathyroidism History of CAD, hyperlipidemia Chronic combined CHF DM type II poorly controlled History of hyperthyroidism Anemia of critical illness and chronic disease Hypoglycemia in setting of sepsis   Chesley Mires, MD [place] 11/10/2019, 11:38 AM

## 2019-11-30 DEATH — deceased

## 2020-01-15 IMAGING — DX PORTABLE CHEST - 1 VIEW
1 series · 1 of 1 positions shown · non-contrast
Comparison: None.

CLINICAL DATA: Weakness and fever

EXAM:
PORTABLE CHEST 1 VIEW

[chest ap]
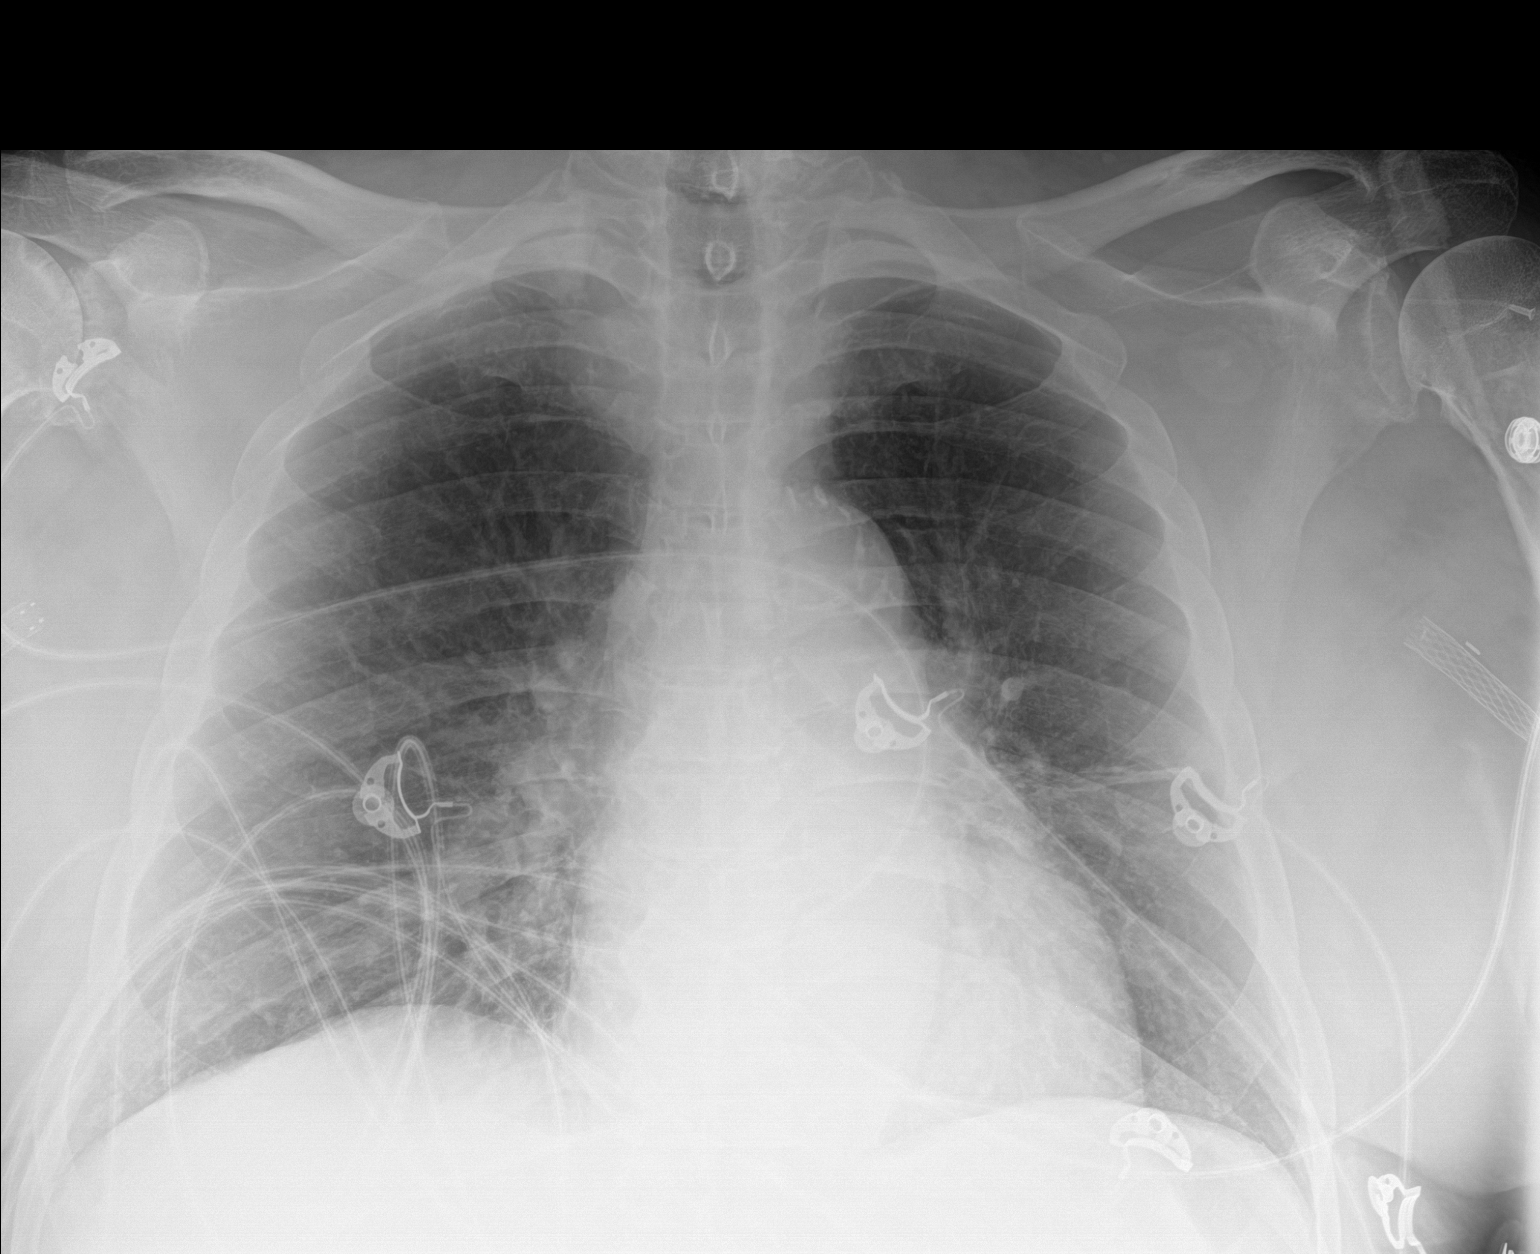

[1 of 1 positions shown; findings below may reference images not displayed]

FINDINGS: The heart size and mediastinal contours are within normal limits.
Both lungs are clear. The visualized skeletal structures are
unremarkable.
IMPRESSION: No active disease.

## 2020-01-18 IMAGING — MR MRI ABDOMEN (MRCP)
10 of 12 series · 41 of 48 positions shown · non-contrast
Comparison: 11/13/2018

CLINICAL DATA: Choledocholithiasis. Bacteremia. Status post
sphincterotomy approximately 4 months ago. Renal insufficiency.

EXAM:
MRI ABDOMEN WITHOUT CONTRAST  (INCLUDING MRCP)
TECHNIQUE: Multiplanar multisequence MR imaging of the abdomen was performed.
Heavily T2-weighted images of the biliary and pancreatic ducts were
obtained, and three-dimensional MRCP images were rendered by post
processing.

[Series 4: bSSFP · coronal · 6.0mm · 0.78mm/px · 2 of 33 slices shown]
[im 1/33]
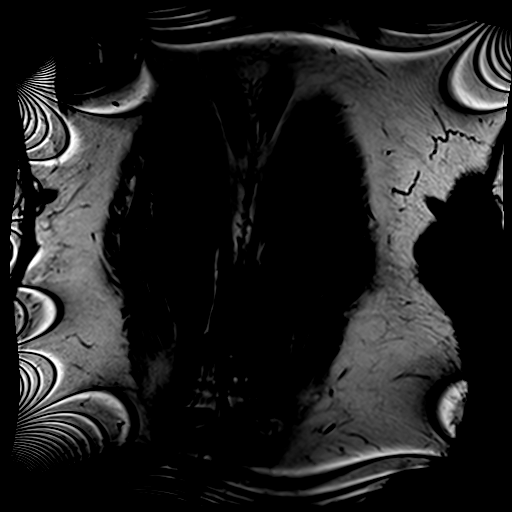
[im 33/33]
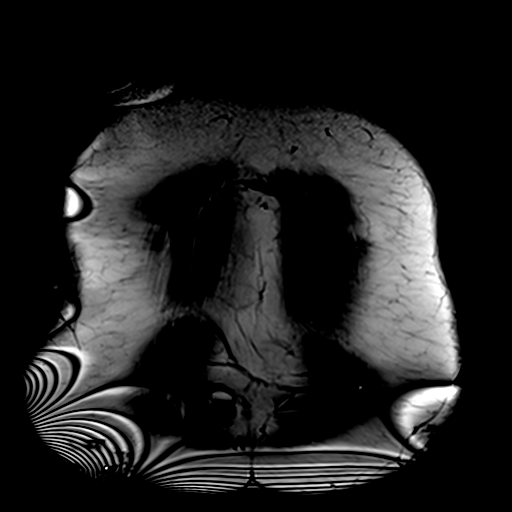

[Series 5: ax haste · axial · 6.0mm · 1.56mm/px · 1 of 13 slices shown (1 of 2)]
[im 1/13]
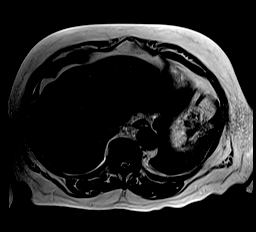

[Series 6: ax haste · axial · 6.0mm · 1.56mm/px · z∈[-179,+87]mm · 2 of 38 slices shown (2 of 2)]
[im 1/38]
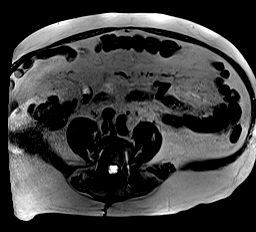
[im 38/38]
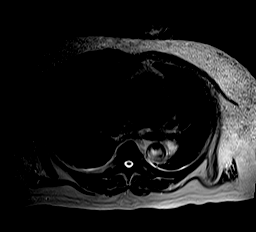

[Series 9: T2 fat-sat · axial · 6.0mm · 1.25mm/px · z∈[-170,+75]mm · 3 of 35 slices shown]
[im 1/35]
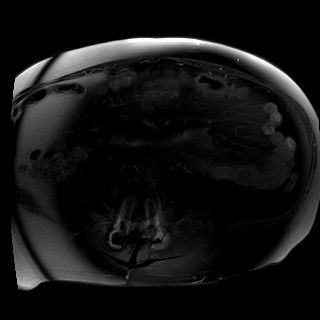
[im 18/35]
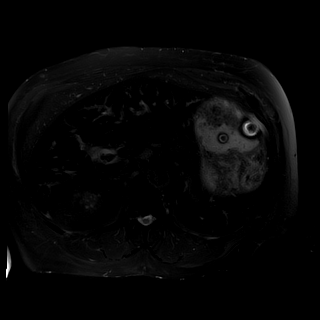
[im 35/35]
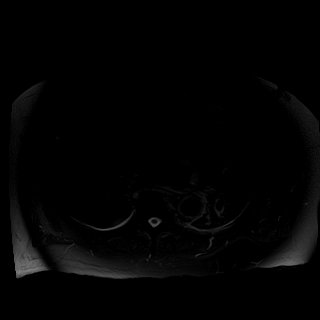

[Series 10: DWI · axial · 6.0mm · 1.49mm/px · z∈[-170,+75]mm · 8 of 105 slices shown (1 of 2)]
[im 1/105]
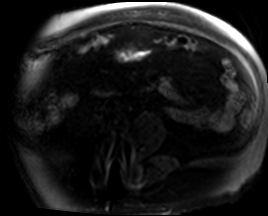
[im 15/105]
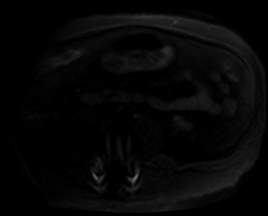
[im 30/105]
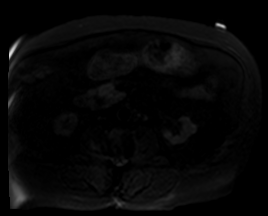
[im 45/105]
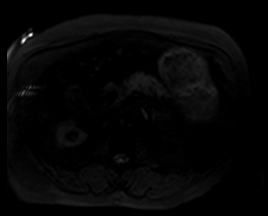
[im 60/105]
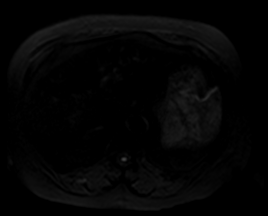
[im 75/105]
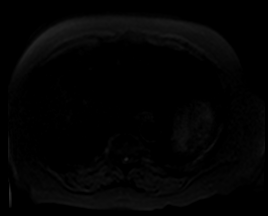
[im 90/105]
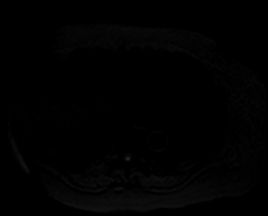
[im 105/105]
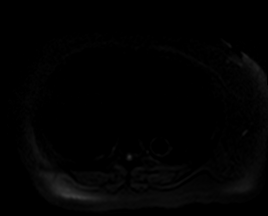

[Series 11: DWI · axial · 6.0mm · 1.49mm/px · z∈[-170,+75]mm · 3 of 35 slices shown (2 of 2)]
[im 1/35]
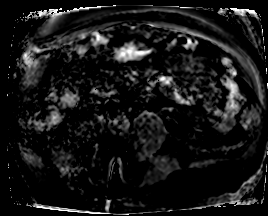
[im 18/35]
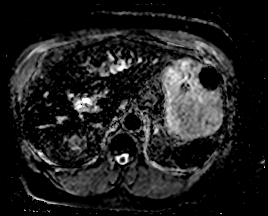
[im 35/35]
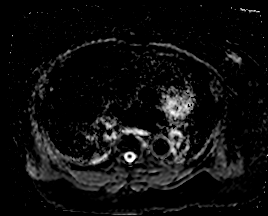

[Series 12: ax in and · axial · 3.0mm · 1.56mm/px · z∈[-177,+84]mm · 14 of 176 slices shown]
[im 1/176]
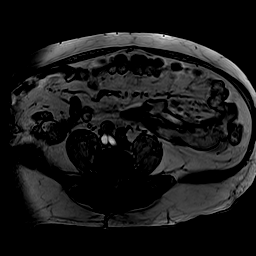
[im 14/176]
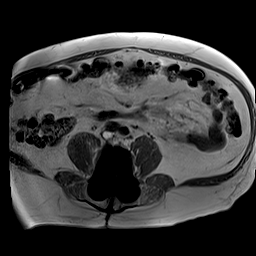
[im 27/176]
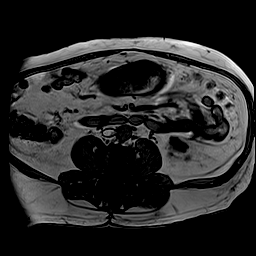
[im 41/176]
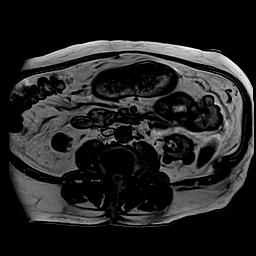
[im 54/176]
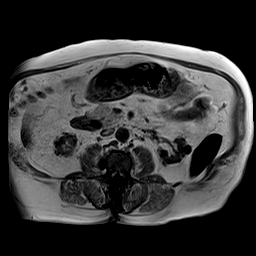
[im 68/176]
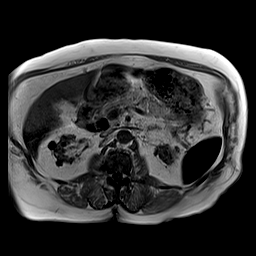
[im 81/176]
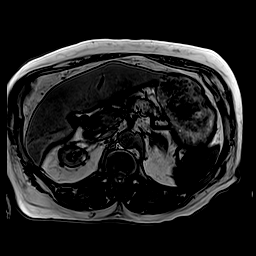
[im 95/176]
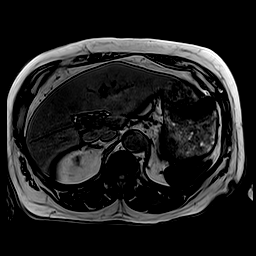
[im 108/176]
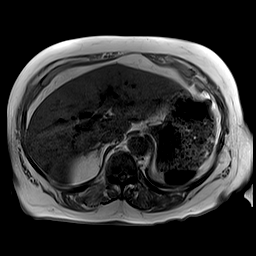
[im 122/176]
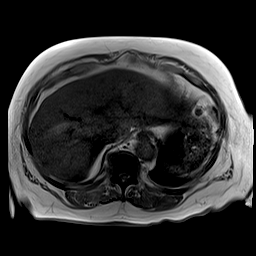
[im 135/176]
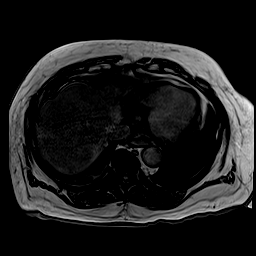
[im 149/176]
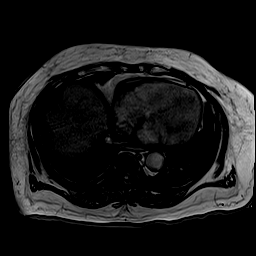
[im 162/176]
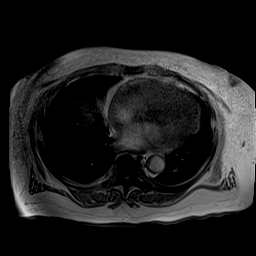
[im 176/176]
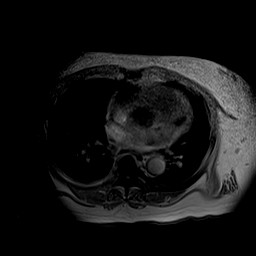

[Series 17: MRCP · coronal · 4.0mm · 1.12mm/px · 1 of 15 slices shown]
[im 1/15]
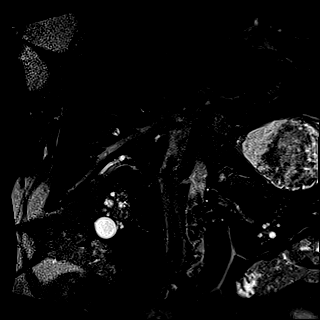

[Series 18: radials · coronal · 50.0mm · 0.78mm/px · 1 of 5 slices shown]
[im 1/5]
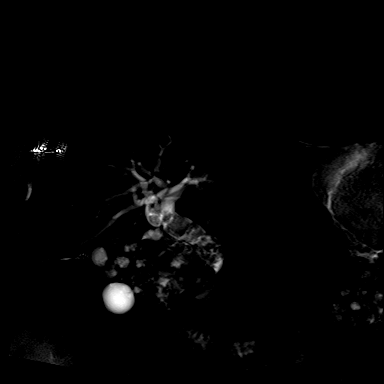

[Series 19: T1 dynamic · axial · non-contrast · 3.0mm · 1.25mm/px · z∈[-165,+72]mm · 6 of 80 slices shown]
[im 1/80]
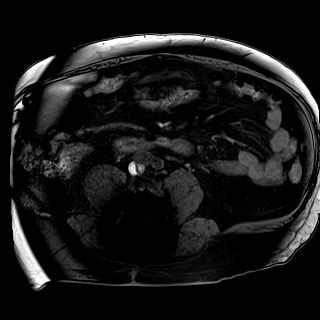
[im 16/80]
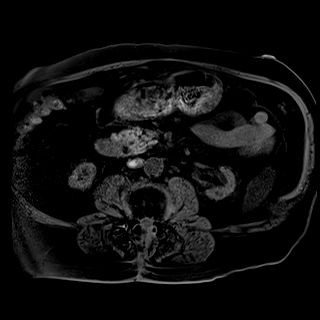
[im 32/80]
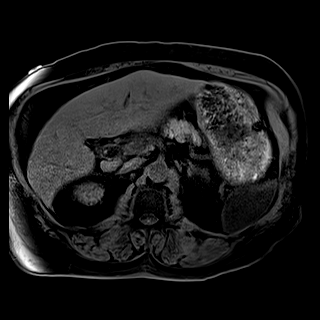
[im 48/80]
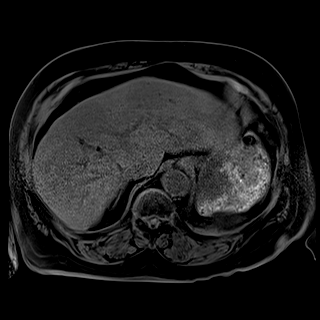
[im 64/80]
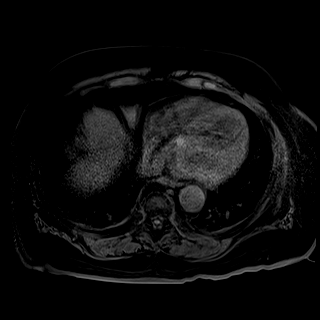
[im 80/80]
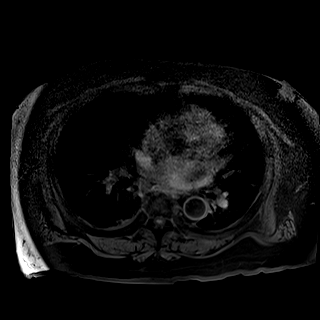

[41 of 48 positions shown; findings below may reference images not displayed]

FINDINGS: Lower chest: No acute findings.

Hepatobiliary: No masses visualized on this unenhanced exam. Diffuse
T2 hypointensity consistent with iron deposition. Prior
cholecystectomy. Diffuse biliary ductal dilatation is seen with from
bile duct measuring up to 20 mm in diameter. Numerous calculi are
seen throughout the common bile duct and common hepatic duct,
largest measuring 1.8 cm. Degree of ductal dilatation in number of
biliary calculi increased since previous study.

Pancreas: No mass or inflammatory process visualized on this
unenhanced exam. No evidence of pancreatic ductal dilatation.

Spleen: Within normal limits in size. Diffuse T2 hypointensity,
consistent with iron deposition.

Adrenals/Urinary tract: Bilateral diffuse renal parenchymal atrophy
and numerous tiny bilateral renal cysts again seen, likely due to
chronic cystic disease of dialysis. No evidence of solid renal mass
or hydronephrosis.

Stomach/Bowel: Visualized portion unremarkable.

Vascular/Lymphatic: No pathologically enlarged lymph nodes
identified. No evidence of abdominal aortic aneurysm.

Other:  None.

Musculoskeletal: No suspicious bone lesions identified. Diffuse bone
marrow T2 hypointensity, consistent with iron overload.
IMPRESSION: Increased severe diffuse biliary ductal dilatation, with increase in
numerous calculi throughout the common hepatic and bile ducts
measuring up to 1.8 cm.

Findings of transfusional hemosiderosis.

## 2020-01-20 IMAGING — RF ERCP
1 series · 5 of 5 positions shown · non-contrast
Comparison: MRCP-03/29/2019

FLUOROSCOPY TIME:  3 minutes, 46 seconds

CLINICAL DATA: ERCP for common bile duct stone.

EXAM:
ERCP
TECHNIQUE: Multiple spot images obtained with the fluoroscopic device and
submitted for interpretation post-procedure.

[Series 1: unknown protocol · 0.20mm/px · 5 of 5 slices shown]
[im 1/5]
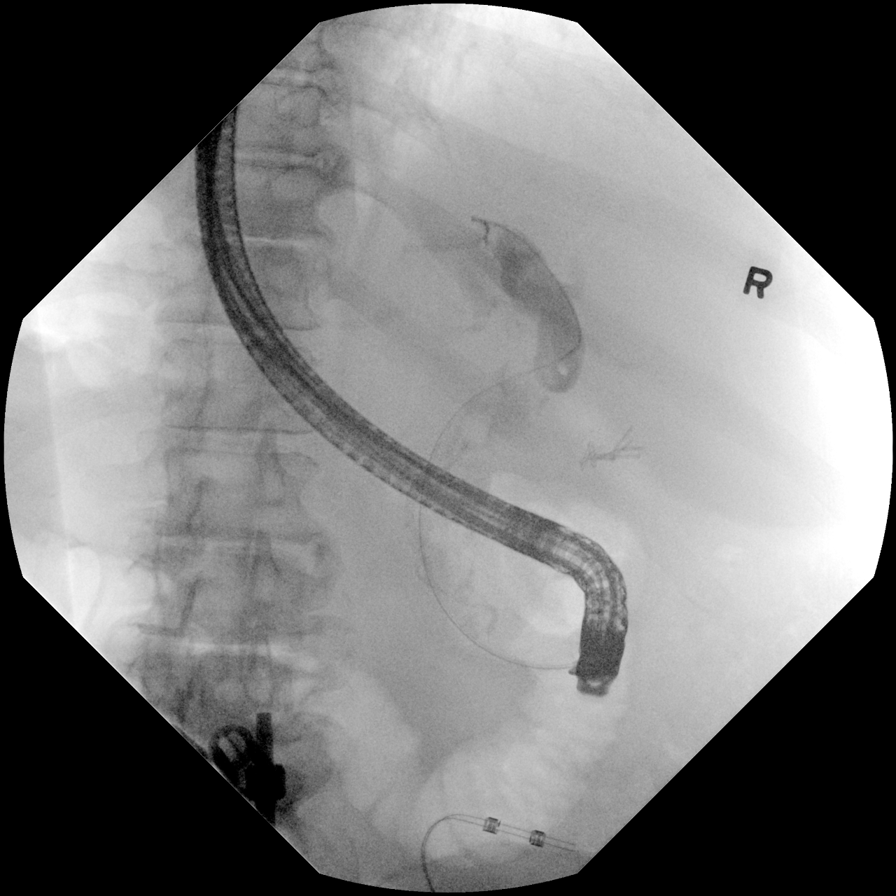
[im 2/5]
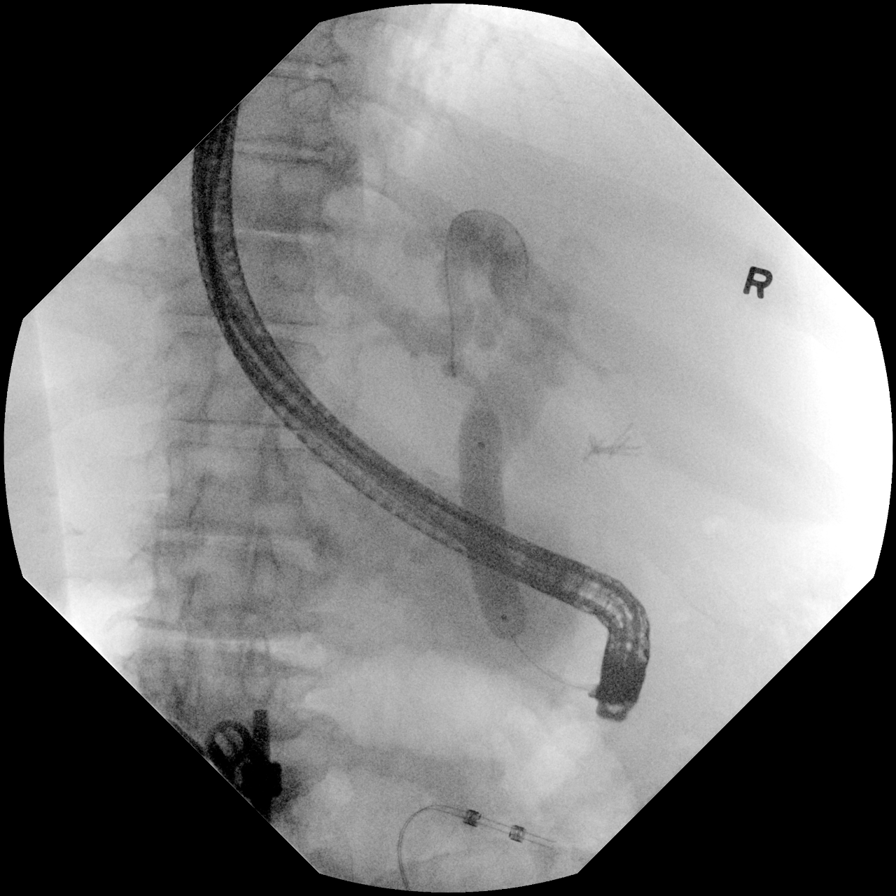
[im 3/5]
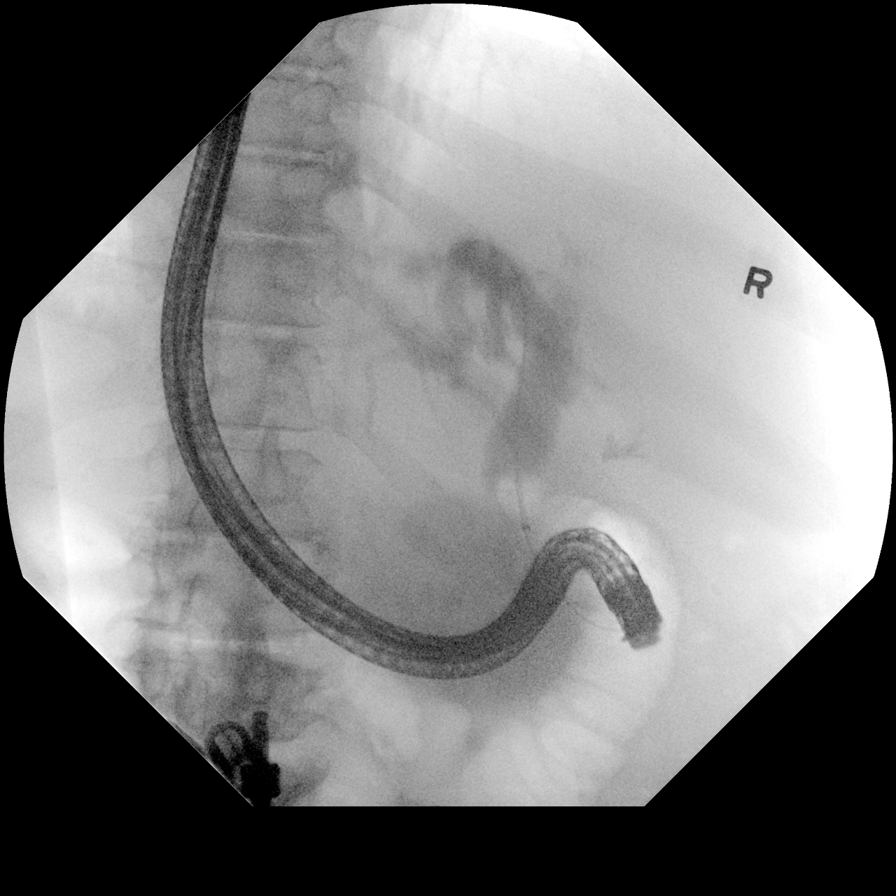
[im 4/5]
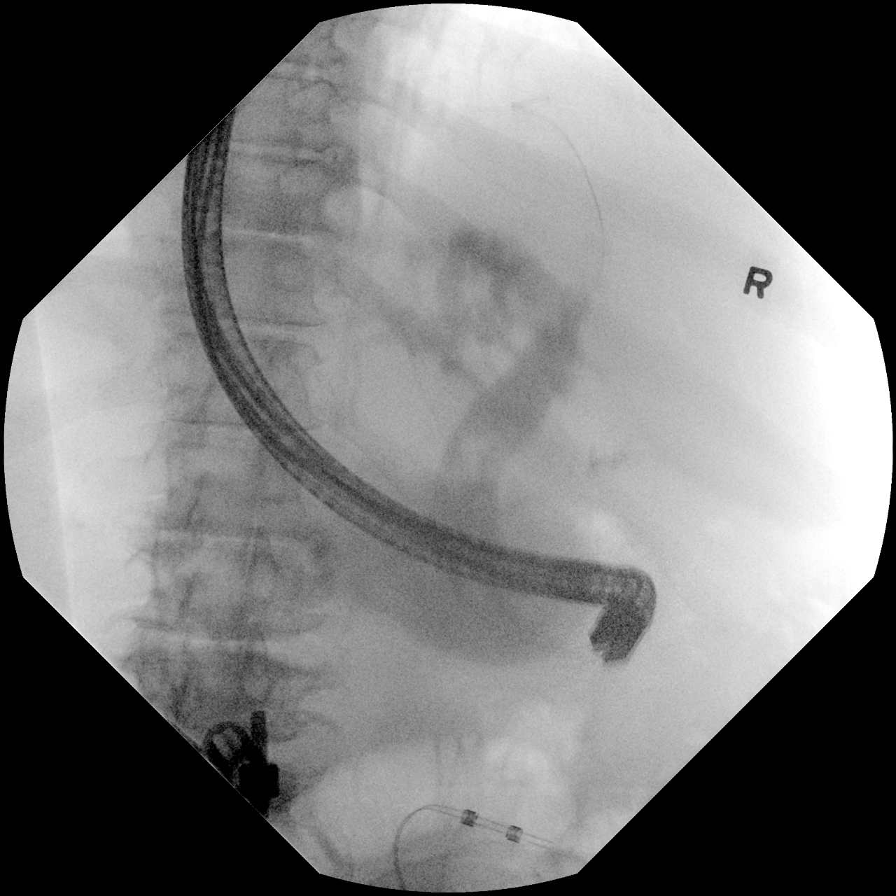
[im 5/5]
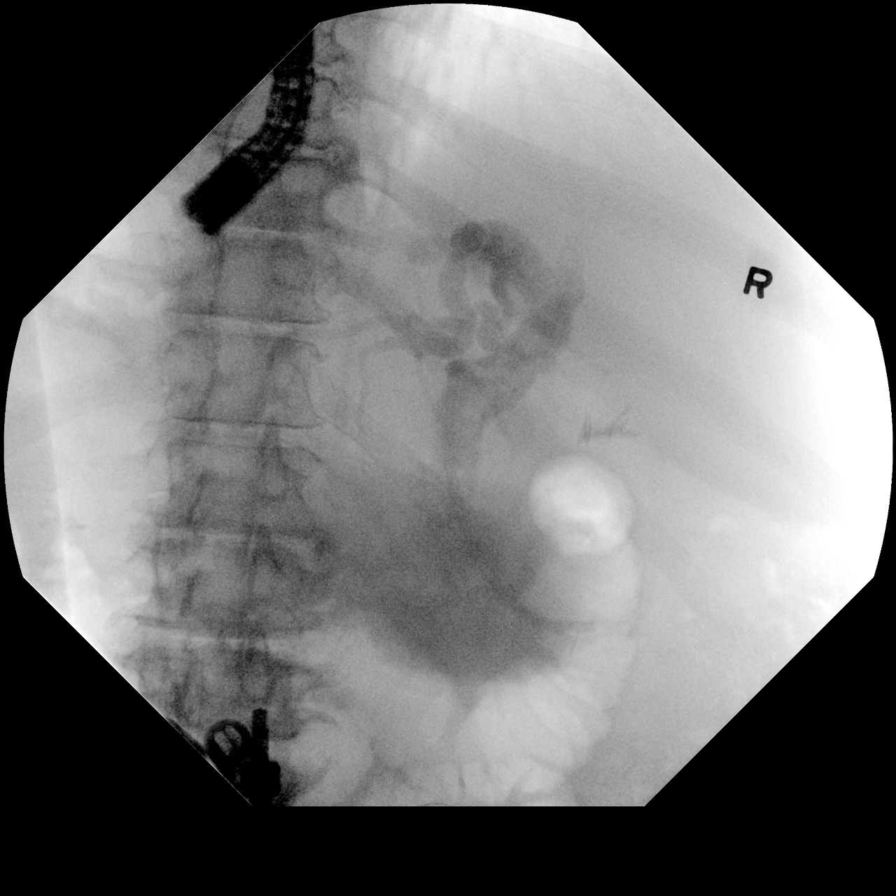

[5 of 5 positions shown; findings below may reference images not displayed]

FINDINGS: Five spot intraoperative fluoroscopic images of the right upper
abdominal quadrant during ERCP are provided for review

Initial image demonstrates an ERCP probe overlying the right upper
abdominal quadrant. Cholecystectomy clips overlies expected location
of gallbladder fossa.

There is faint opacification of the moderate to markedly dilated
common bile duct with multiple ill-defined filling defects within
the CBD worrisome for extensive choledocholithiasis as demonstrated
on preceding MRCP.

Subsequent images demonstrate insufflation of a balloon within the
mid/distal aspect of the CBD.

Subsequent images demonstrate insufflation of a balloon within the
central aspect of the CBD with subsequent presumed biliary sweeping
and sphincterotomy.

Completion image demonstrates placement of an internal plastic
biliary stent with distal tip overlying the descending portion of
the duodenum in superior tip overlying the superior/mid aspect the
CBD.
IMPRESSION: ERCP with findings suggestive of extensive cholelithiasis with
subsequent biliary sweeping / sphincterotomy and internal biliary
stent placement as above.

These images were submitted for radiologic interpretation only.
Please see the procedural report for the amount of contrast and the
fluoroscopy time utilized.

## 2020-03-10 IMAGING — RF ERCP
1 series · 14 of 24 positions shown · non-contrast
Comparison: MRCP 04/28/2019; prior ERCP 04/30/2019

CLINICAL DATA: 68-year-old male with a history of
choledocholithiasis status post prior sphincterotomy and biliary
stent placement.

EXAM:
ERCP
TECHNIQUE: Multiple spot images obtained with the fluoroscopic device and
submitted for interpretation post-procedure.
FLUOROSCOPY TIME:  Fluoroscopy Time:  8 minutes 4 seconds

[Series 1: run · 14 of 33 slices shown]
[im 1/33]
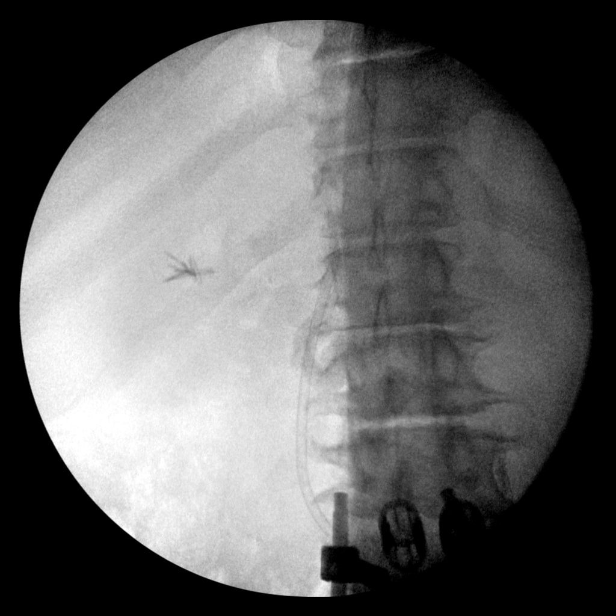
[im 3/33]
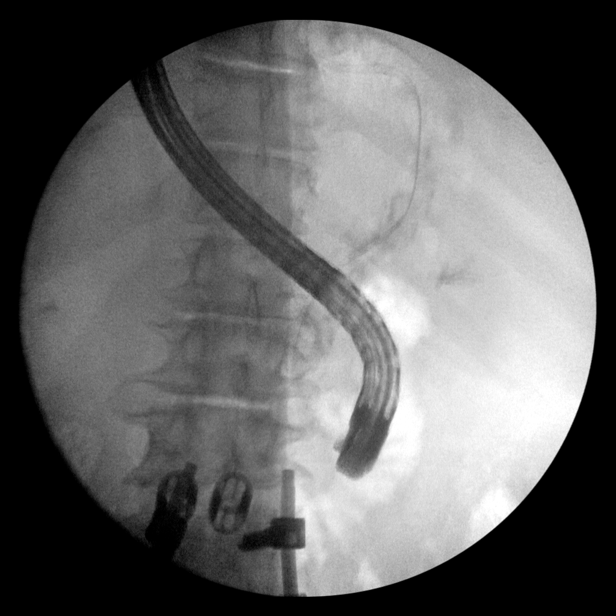
[im 6/33]
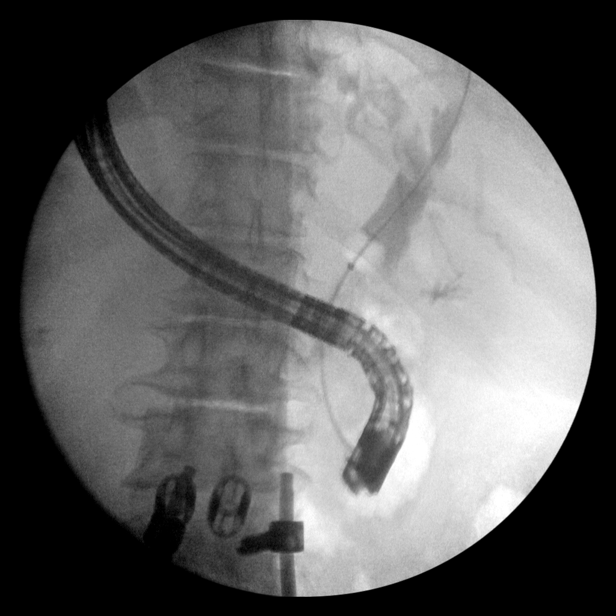
[im 9/33]
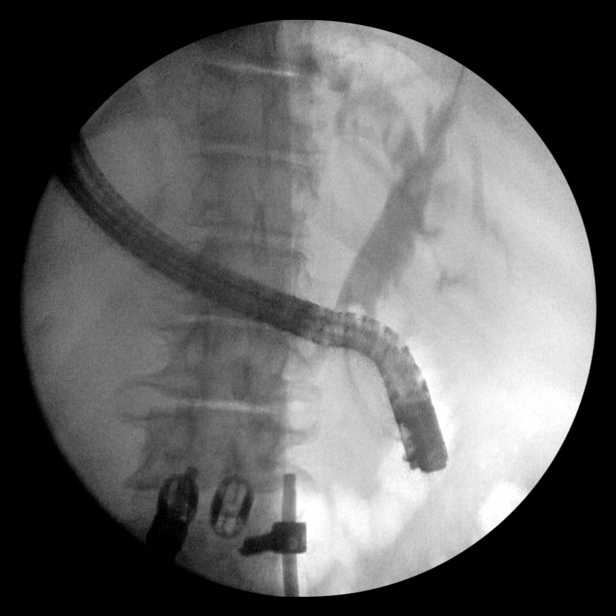
[im 10/33]
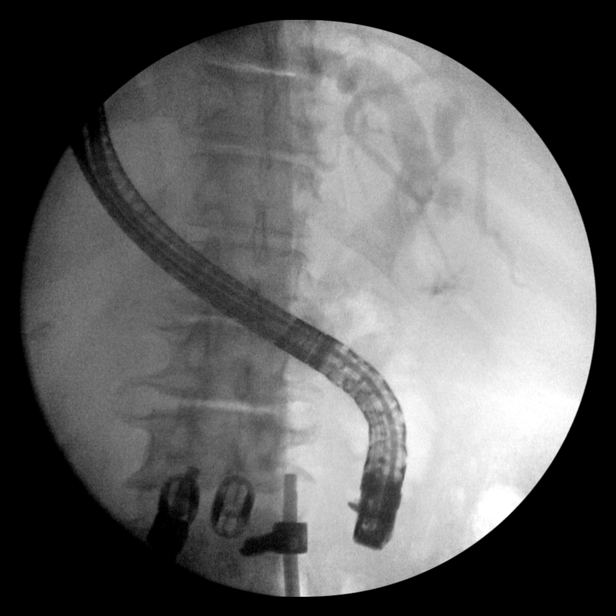
[im 13/33]
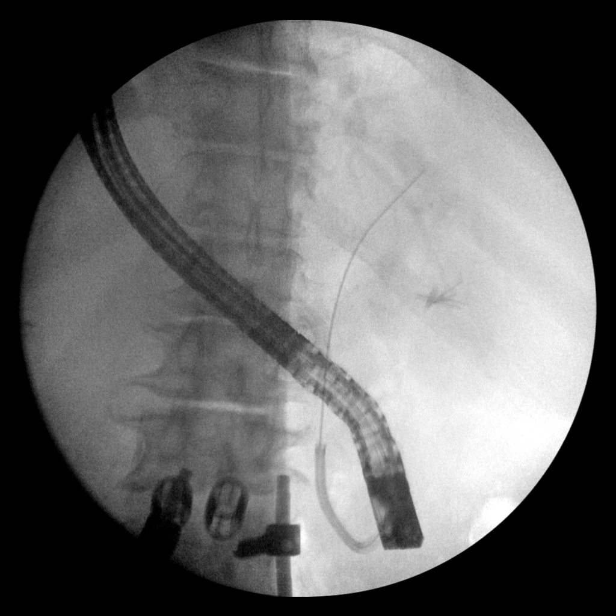
[im 16/33]
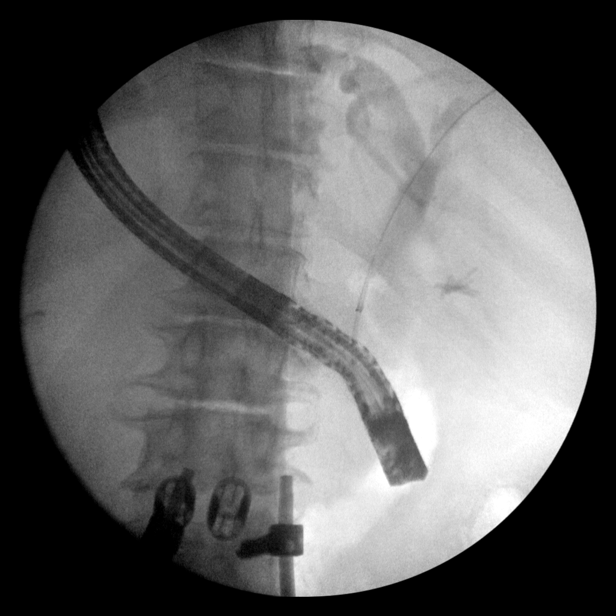
[im 17/33]
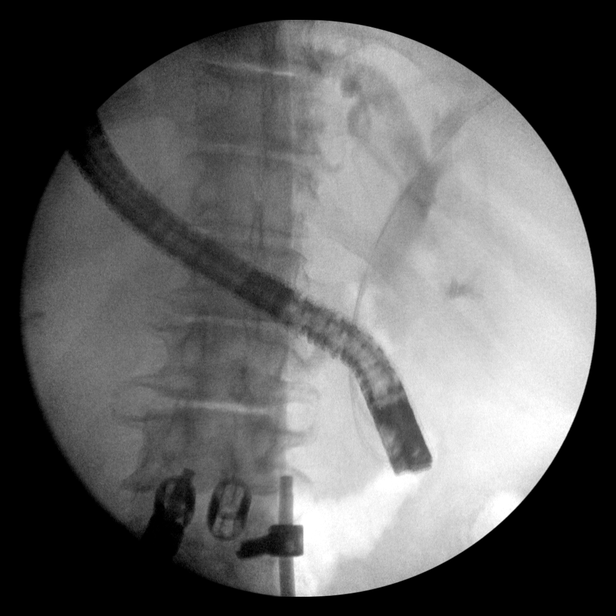
[im 20/33]
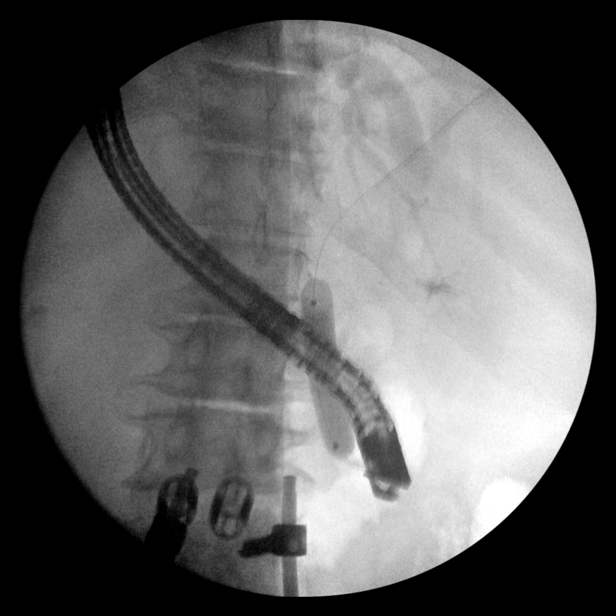
[im 23/33]
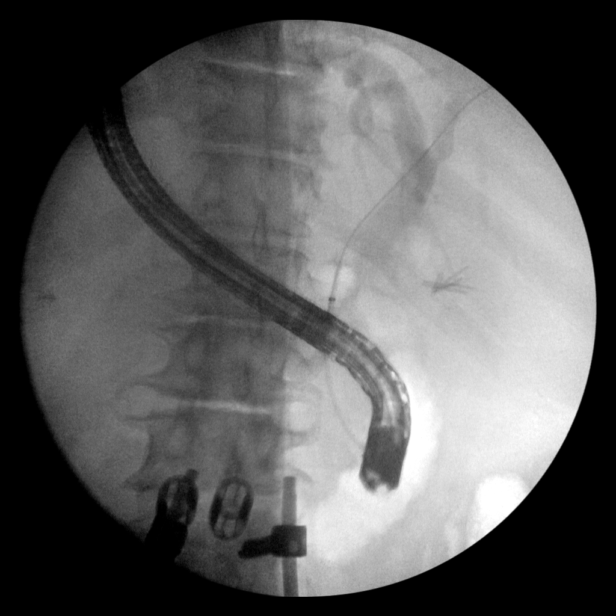
[im 26/33]
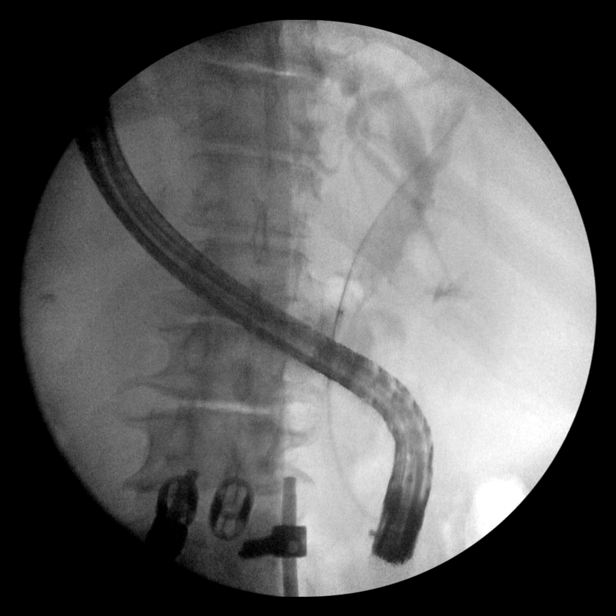
[im 27/33]
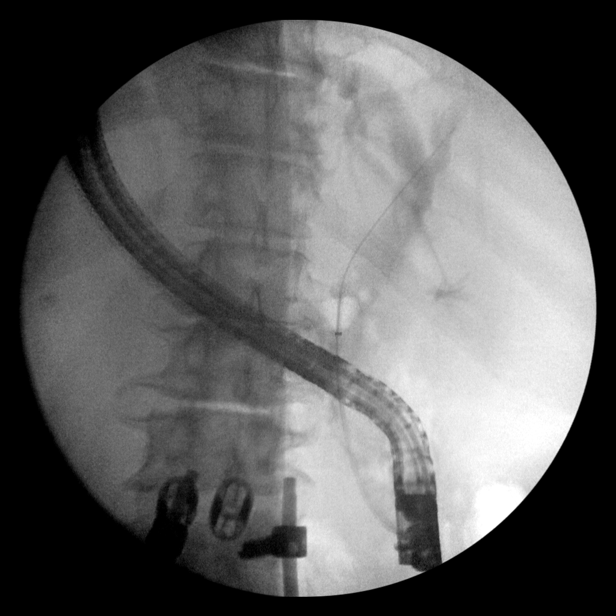
[im 30/33]
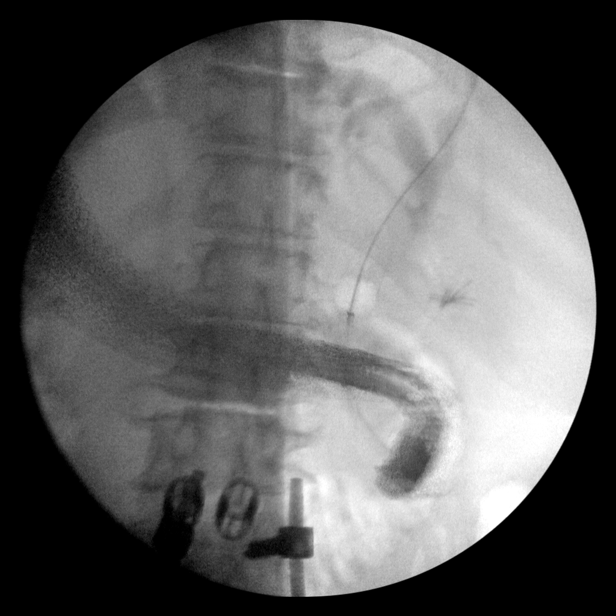
[im 33/33]
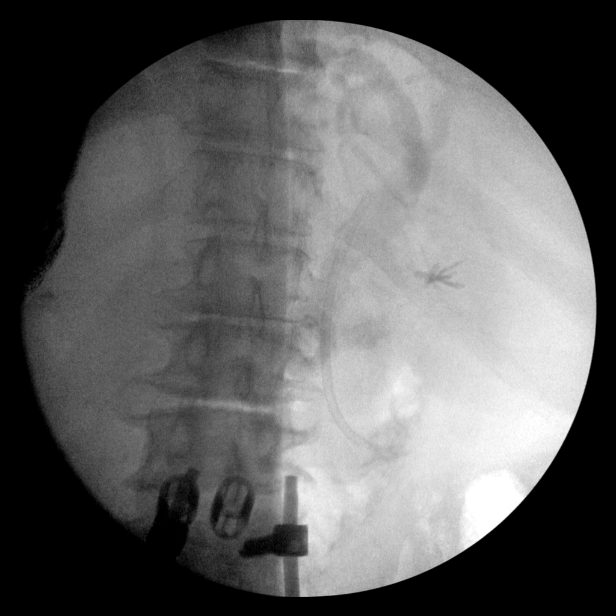

[14 of 24 positions shown; findings below may reference images not displayed]

FINDINGS: A total of 33 intra procedural saved images are submitted for
review.

The initial image demonstrates a plastic biliary stent along the
expected course of the common bile duct. Cholecystostomy clips are
present in the gallbladder fossa.

Subsequent images demonstrate a flexible duodenal scope in the
descending duodenum with wire cannulation of the common bile duct.
Cholangiogram was then performed. There are numerous faceted filling
defects throughout the common bile duct, common hepatic duct and
intrahepatic ducts consistent with extensive choledocholithiasis.

Additional imaging demonstrates introduction of a Spy glass
choledochoscope, balloon sphincterotomy and extensive sweeping of
the bile ducts. On the completion image, there has been replacement
of a plastic biliary stent.
IMPRESSION: 1. Extensive choledocholithiasis.
2. ERCP with removal of previously placed plastic biliary stent,
sink draw to me and extensive balloon sweeping of the intrahepatic
and common bile ducts followed by replacement with a new plastic
biliary stent.

These images were submitted for radiologic interpretation only.
Please see the procedural report for the amount of contrast and the
fluoroscopy time utilized.

## 2020-04-23 IMAGING — DX DG CHEST 2V
2 series · 2 of 2 positions shown · non-contrast
Comparison: 03/26/2019

CLINICAL DATA: Abnormal EKG, weakness and decreased stamina for 1
week, nonradiating generalized dull chest pain, shortness of breath
with exertion, history end-stage renal disease on dialysis, diabetes
mellitus, hypertension

EXAM:
CHEST - 2 VIEW

[w chest lat]
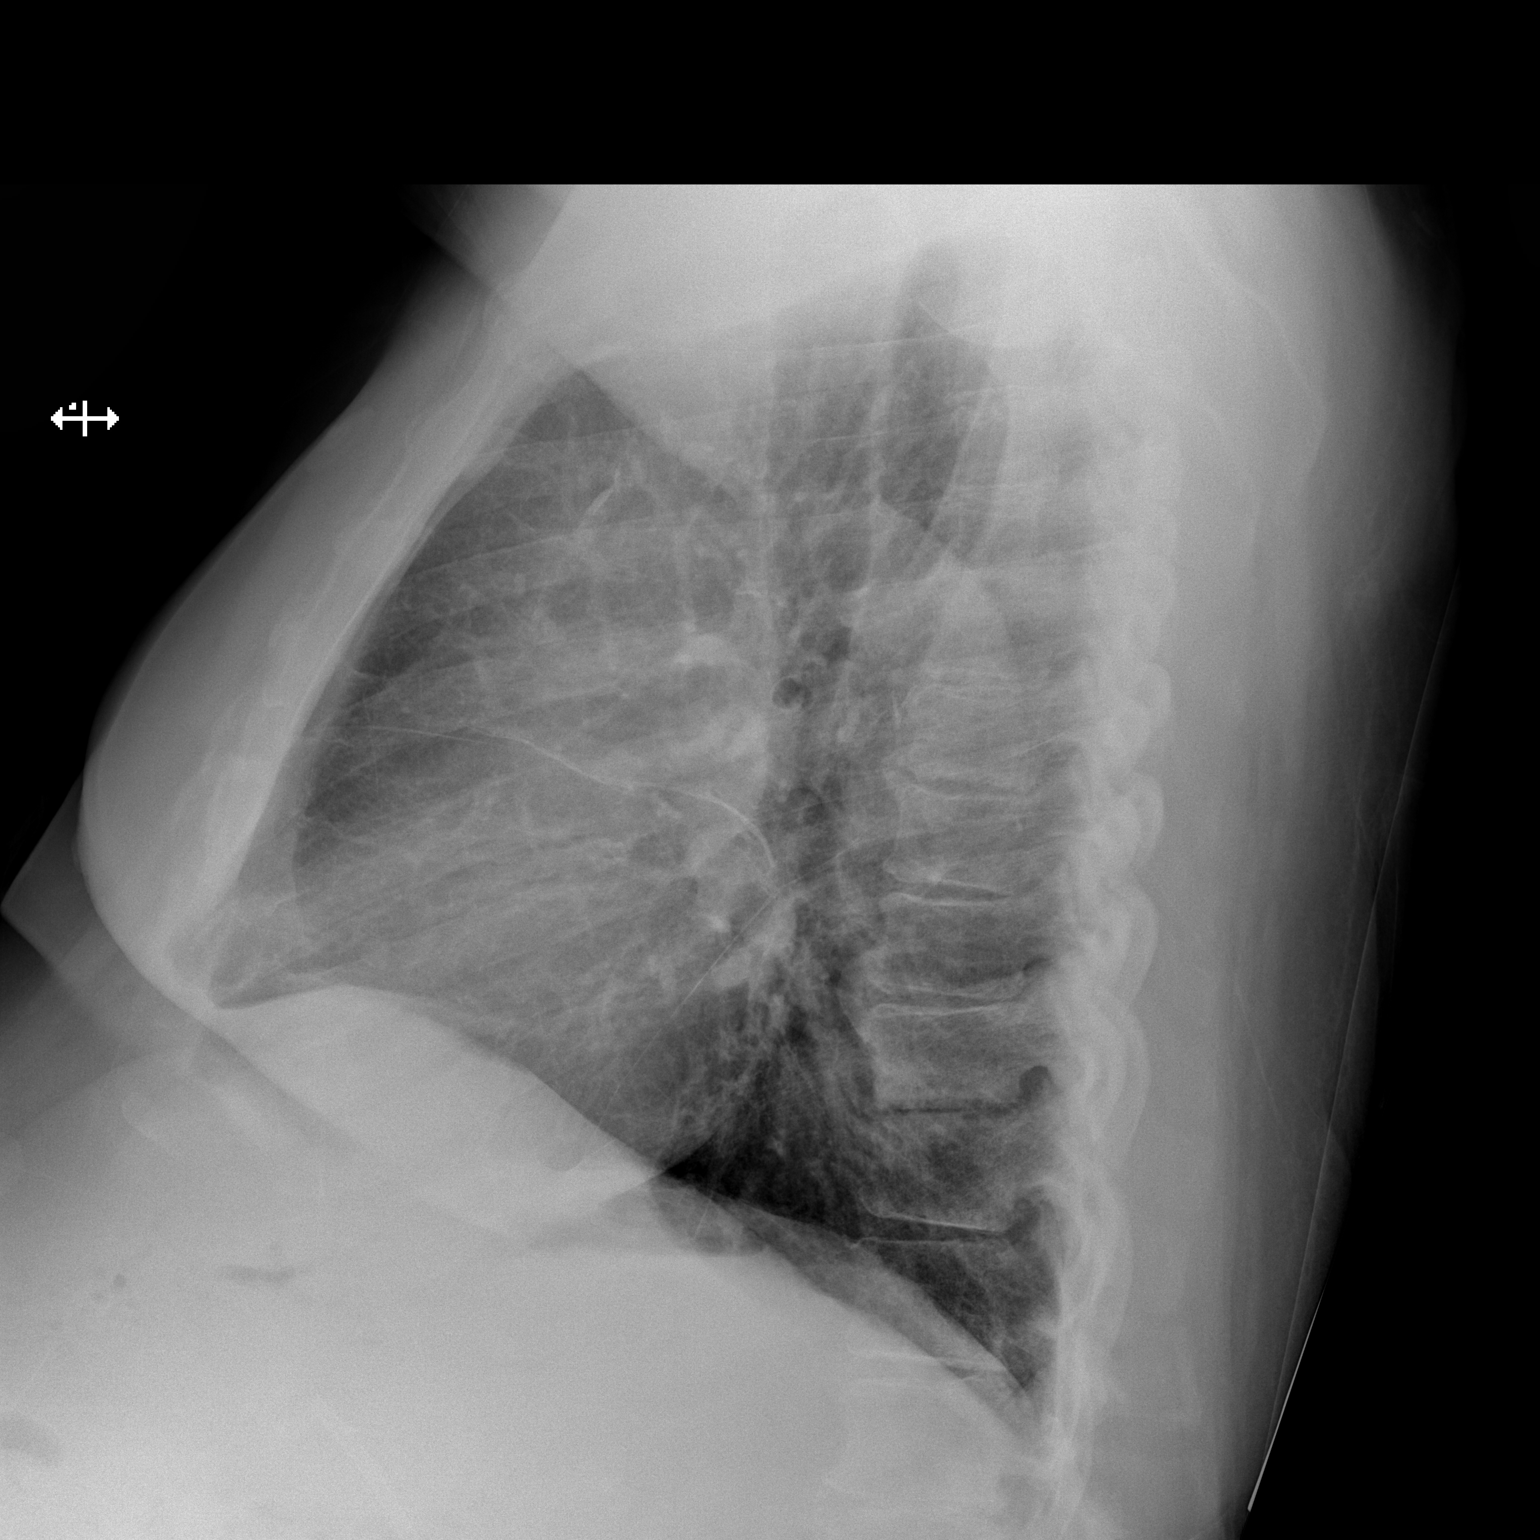

[x chest ap]
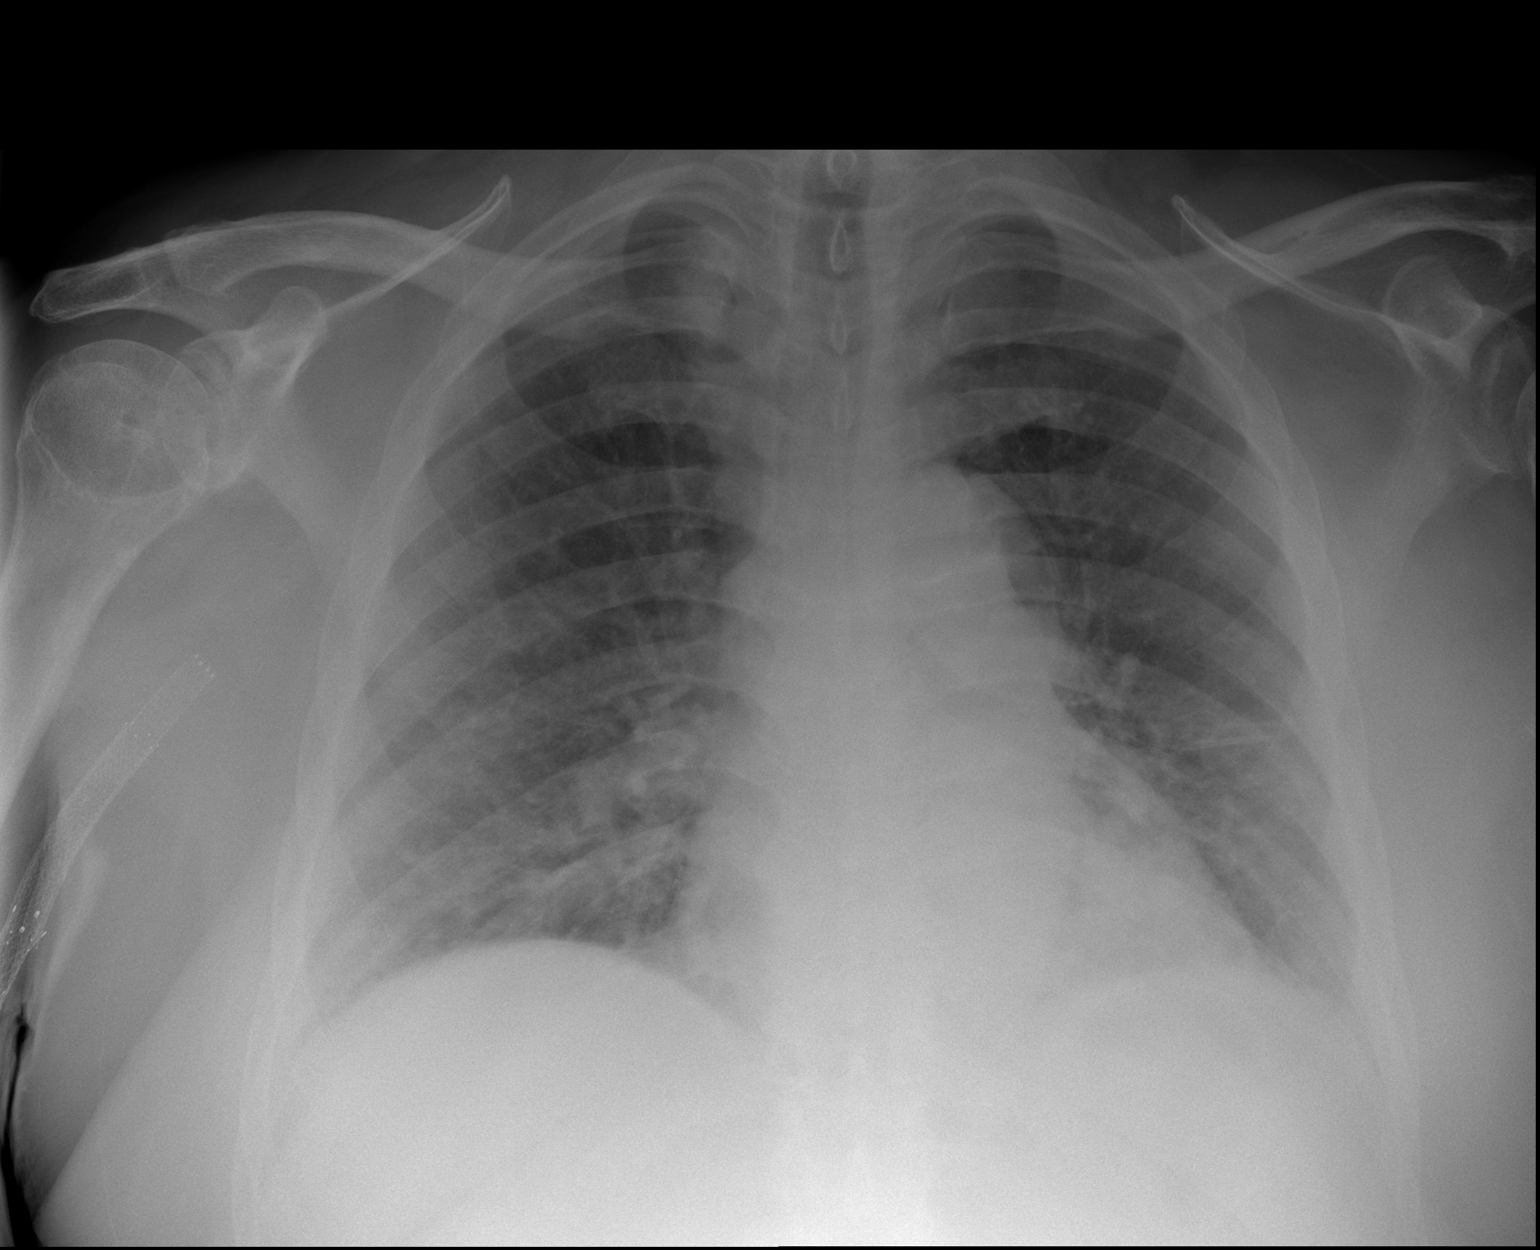

[2 of 2 positions shown; findings below may reference images not displayed]

FINDINGS: Normal heart size and pulmonary vascularity.

Atherosclerotic calcification aorta.

Mediastinal contours otherwise normal.

Mild bibasilar atelectasis.

No infiltrate, pleural effusion or pneumothorax.

Prior cervical spine fusion.

Vascular stents noted in the upper RIGHT arm.

Scattered degenerative disc disease changes thoracic spine.
IMPRESSION: Bibasilar atelectasis.

No acute abnormalities.

## 2020-05-21 IMAGING — DX DG CHEST 1V PORT
1 series · 1 of 1 positions shown · non-contrast
Comparison: 07/03/2019

CLINICAL DATA: Chest pain, fever, dialysis patient.

EXAM:
PORTABLE CHEST 1 VIEW

[chest ap]
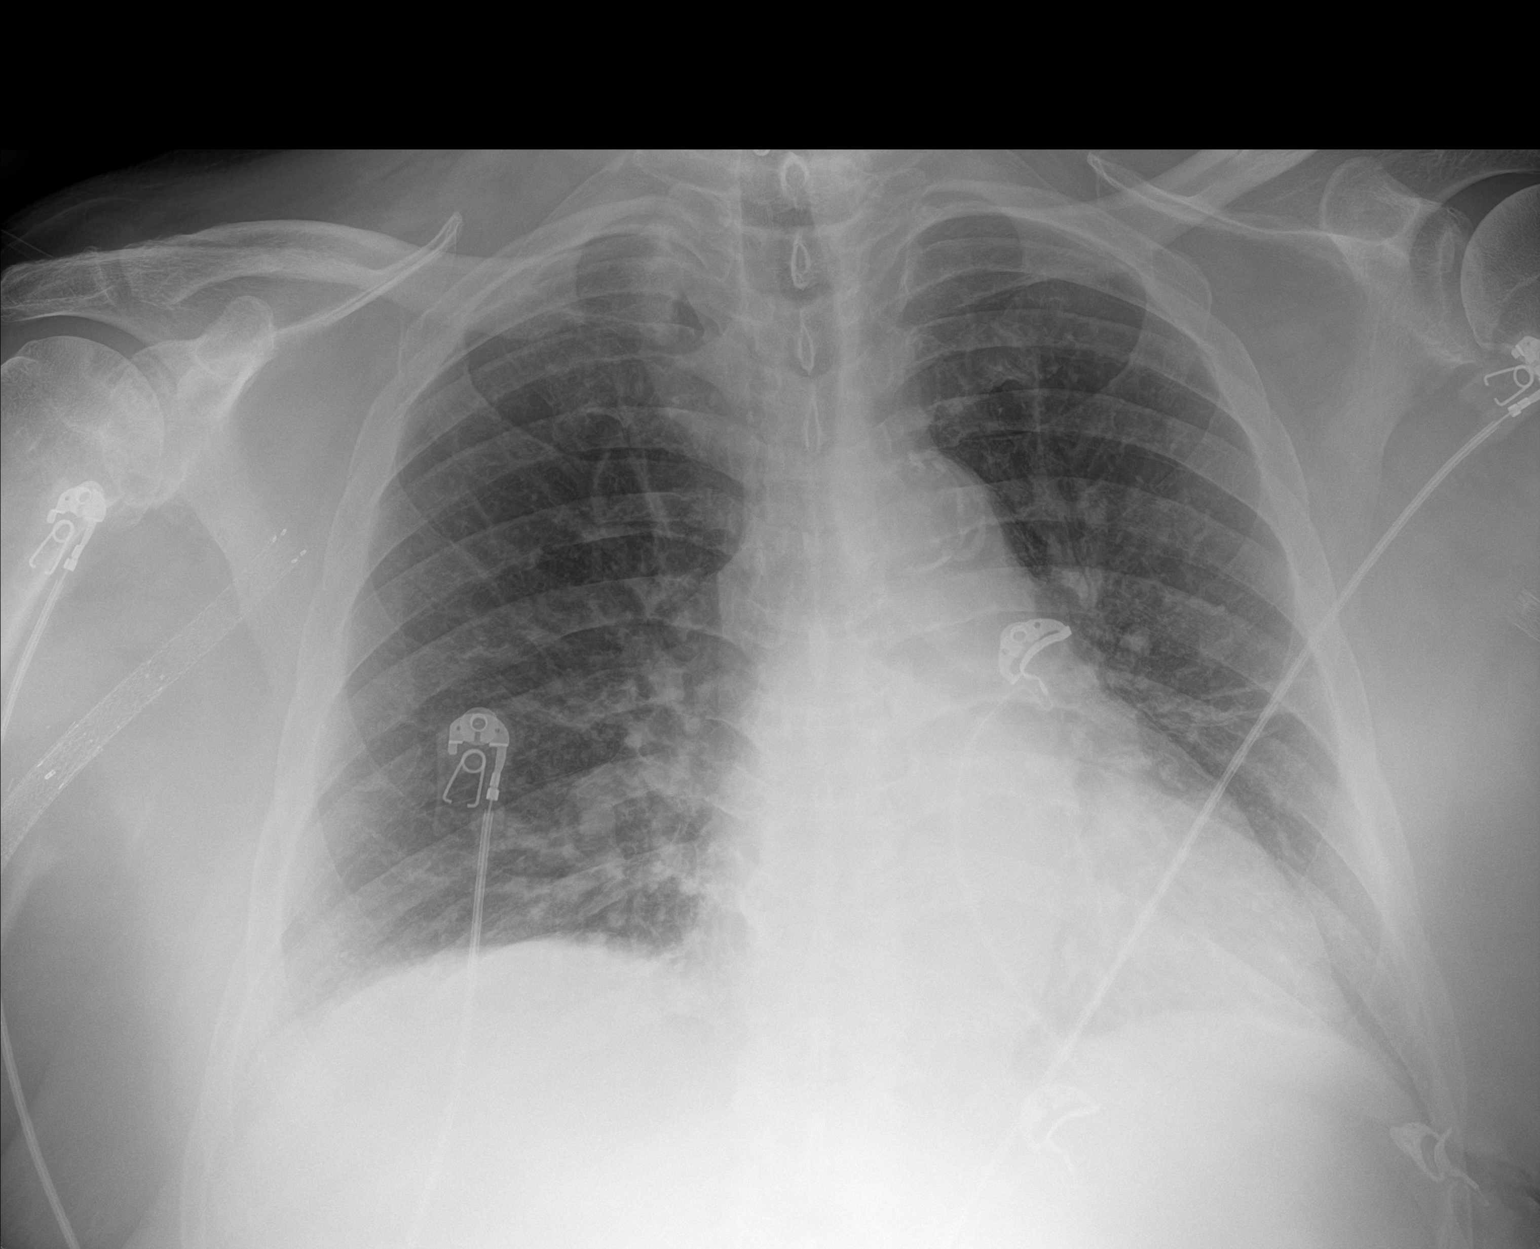

[1 of 1 positions shown; findings below may reference images not displayed]

FINDINGS: Borderline enlarged cardiac silhouette. The aorta remains tortuous
and calcified. Stable linear scarring in the left mid lung zone.
Mildly prominent interstitial markings with improvement. Cervical
spine fixation hardware. Right shoulder degenerative changes.
IMPRESSION: 1. No acute abnormality.
2. Stable borderline cardiomegaly.
3. Mild chronic interstitial lung disease.

## 2020-05-21 IMAGING — CT CT HEAD W/O CM
4 series · 16 of 47 positions shown, 18 images · non-contrast
Comparison: 10/04/2018

CLINICAL DATA: Encephalopathy

EXAM:
CT HEAD WITHOUT CONTRAST
TECHNIQUE: Contiguous axial images were obtained from the base of the skull
through the vertex without intravenous contrast.

[Series 505: head wo · axial · 0.47mm/px · z∈[+268,+392]mm · 7 of 35 slices shown, 9 images]
[im 5/35  brain]
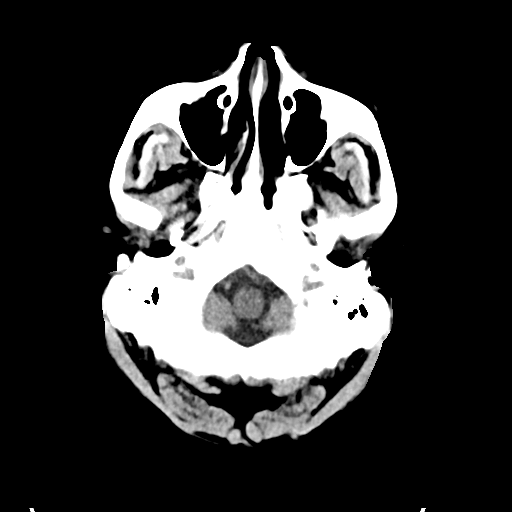
[im 5/35  bone]
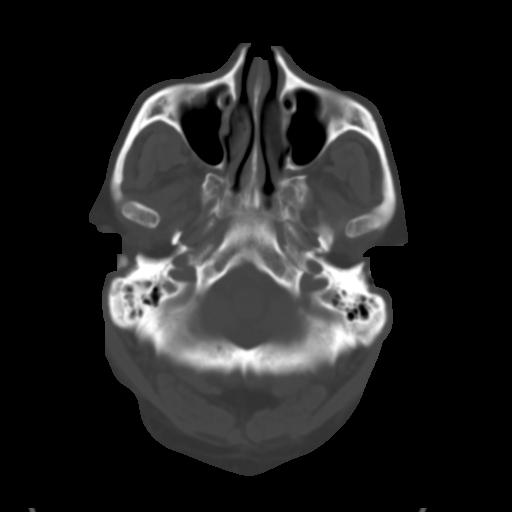
[im 9/35  brain]
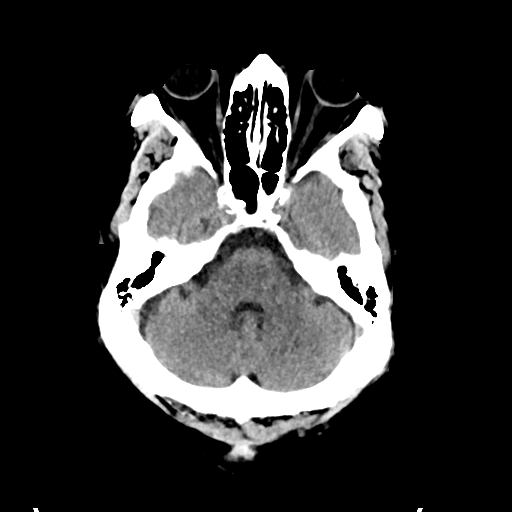
[im 13/35  brain]
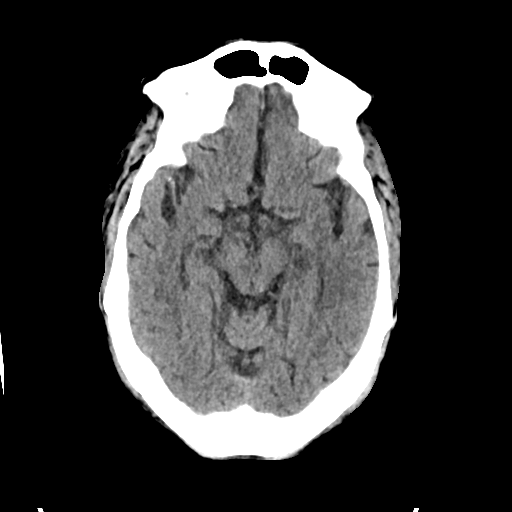
[im 18/35  brain]
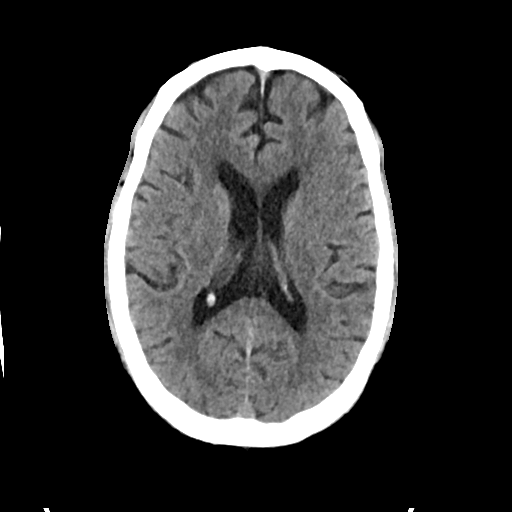
[im 22/35  brain]
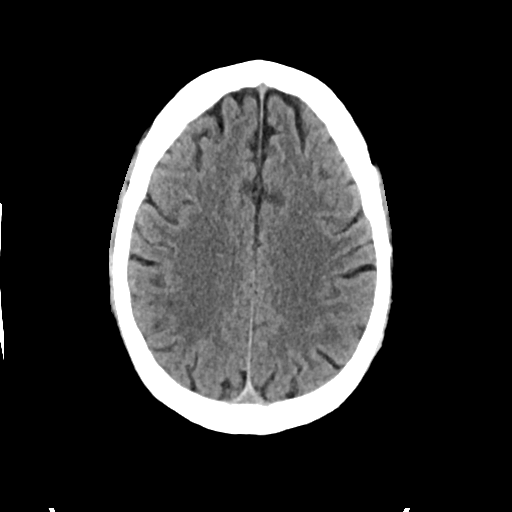
[im 22/35  bone]
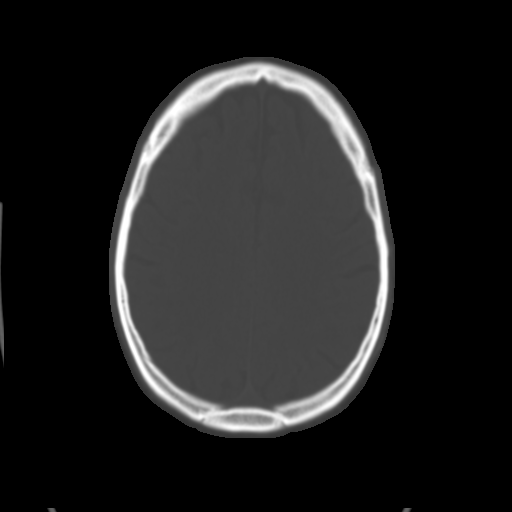
[im 26/35  brain]
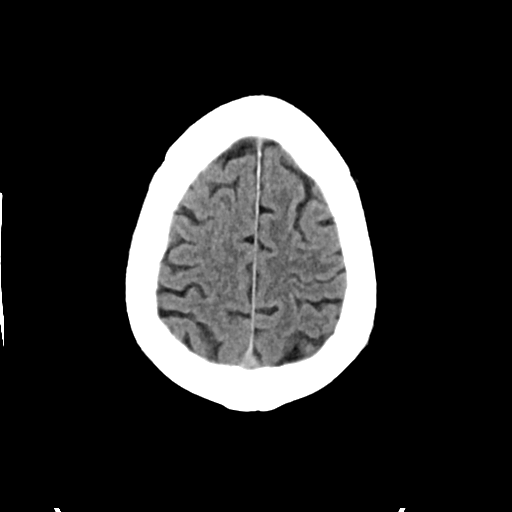
[im 30/35  brain]
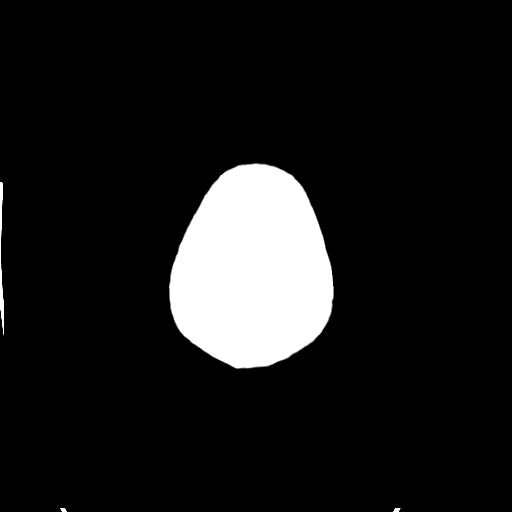

[Series 506: head bone · axial · 0.47mm/px · z∈[+264,+298]mm · 3 of 86 slices shown]
[im 9/86  bone]
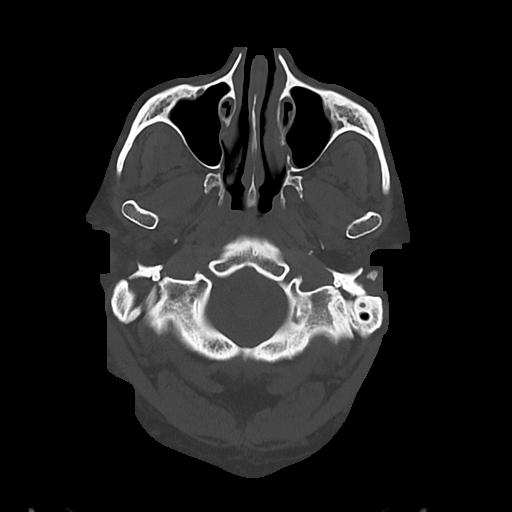
[im 18/86  bone]
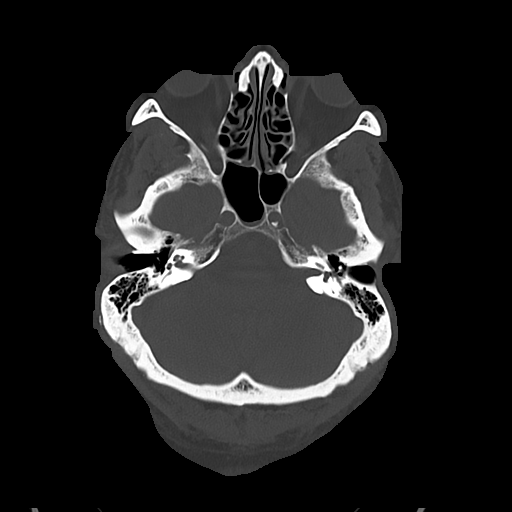
[im 26/86  bone]
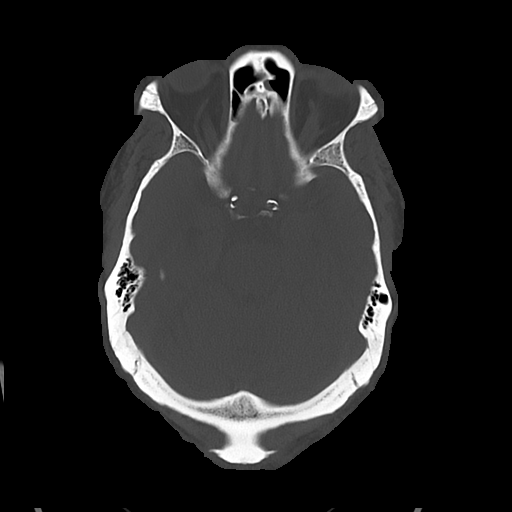

[Series 507: cor soft · coronal · 0.32mm/px · 3 of 75 slices shown]
[im 25/75  brain]
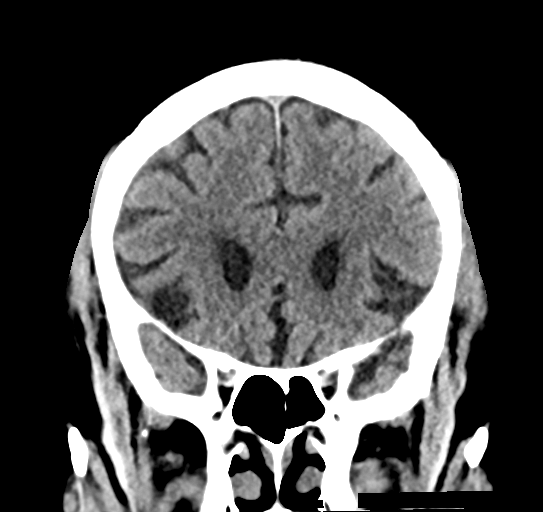
[im 33/75  brain]
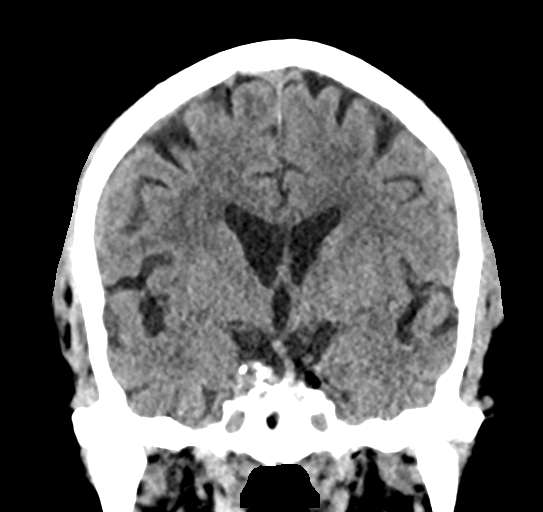
[im 42/75  brain]
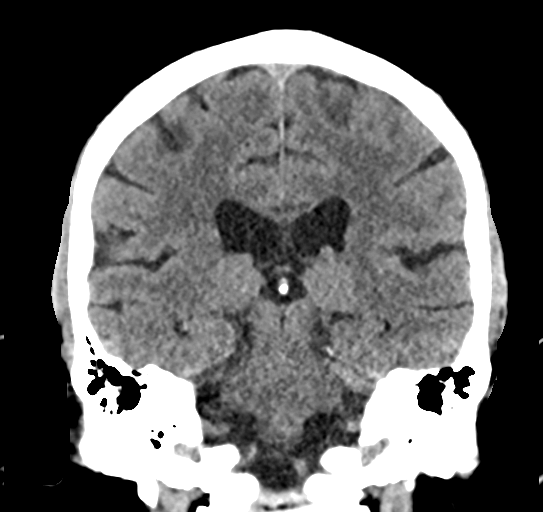

[Series 508: sag soft · sagittal · 0.32mm/px · 3 of 59 slices shown]
[im 20/59  brain]
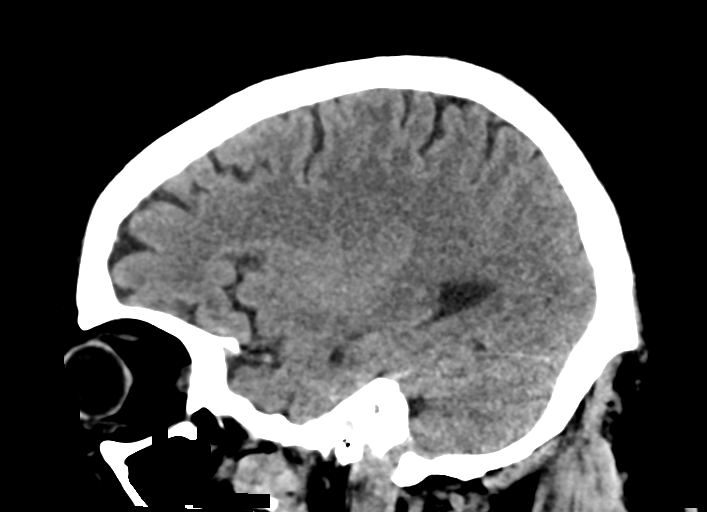
[im 30/59  brain]
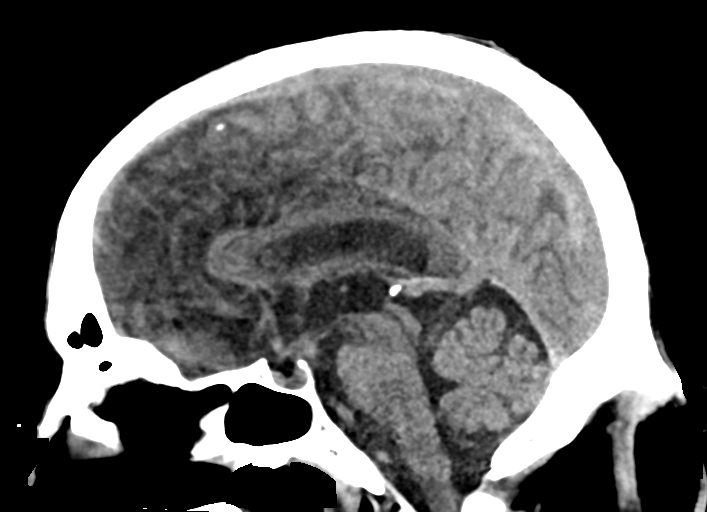
[im 39/59  brain]
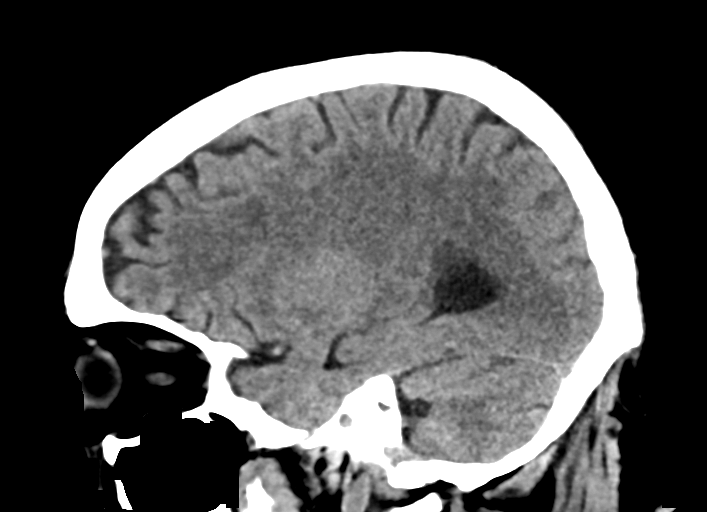

[16 of 47 positions shown; findings below may reference images not displayed]

FINDINGS: Brain: Stable atrophy and chronic white matter microvascular
ischemic changes throughout both cerebral hemispheres. No acute
intracranial hemorrhage, new mass lesion, definite infarction,
midline shift, herniation, hydrocephalus, or extra-axial fluid
collection. No focal mass effect or edema. Cisterns are patent.
Minor cerebellar atrophy.

Vascular: No hyperdense vessel or unexpected calcification.

Skull: Normal. Negative for fracture or focal lesion.

Sinuses/Orbits: No acute finding.

Other: None.
IMPRESSION: Stable atrophy and chronic white matter microvascular ischemic
changes.

No interval change or acute process by noncontrast CT.

## 2020-05-21 IMAGING — CT CT RENAL STONE PROTOCOL
2 of 4 series · 16 of 46 positions shown, 18 images · non-contrast
Comparison: 11/11/2018

CLINICAL DATA: Abdominal pain.

EXAM:
CT ABDOMEN AND PELVIS WITHOUT CONTRAST
TECHNIQUE: Multidetector CT imaging of the abdomen and pelvis was performed
following the standard protocol without IV contrast.

[Series 3: ap without · axial · non-contrast · 0.91mm/px · z∈[-475,-50]mm · 13 of 95 slices shown, 15 images]
[im 5/95  soft-tissue]
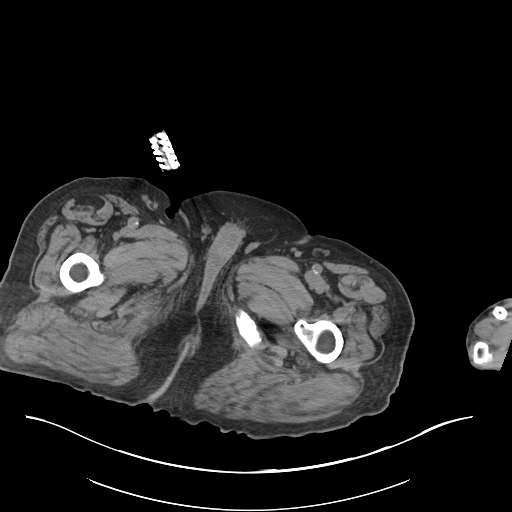
[im 5/95  bone]
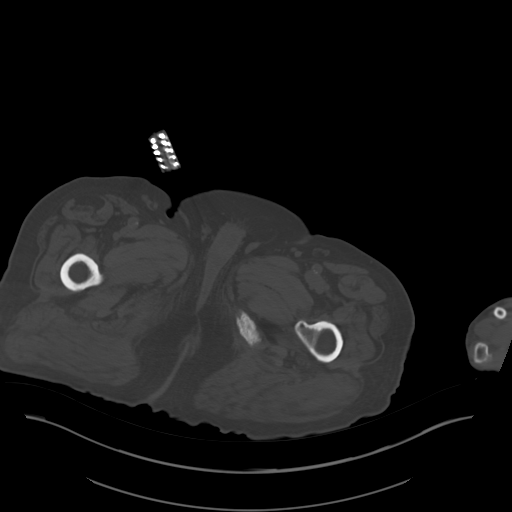
[im 15/95  soft-tissue]
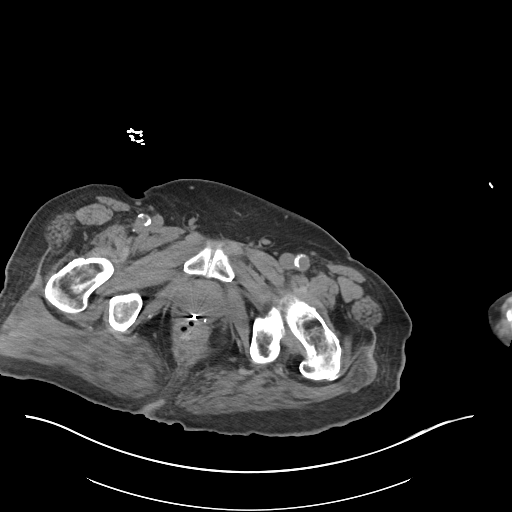
[im 19/95  soft-tissue]
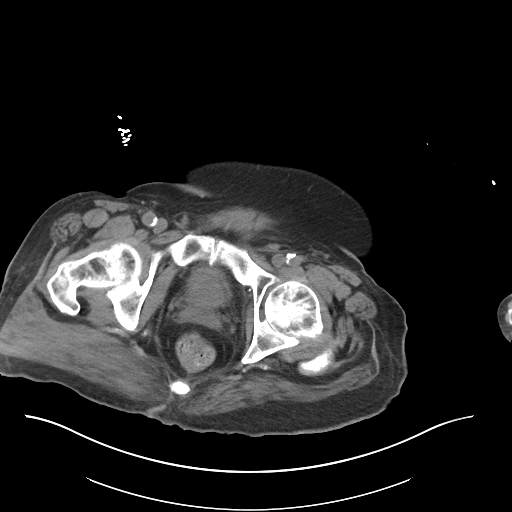
[im 29/95  soft-tissue]
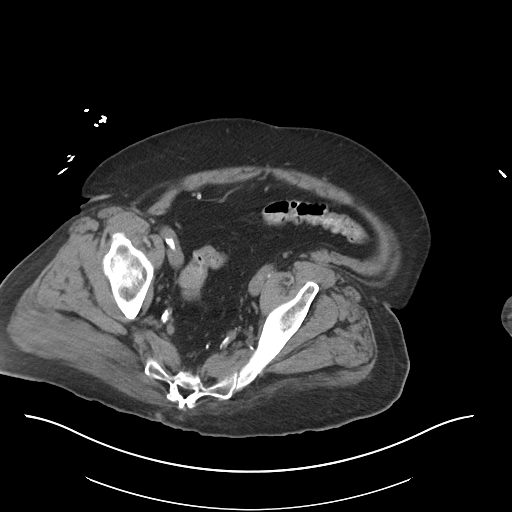
[im 33/95  soft-tissue]
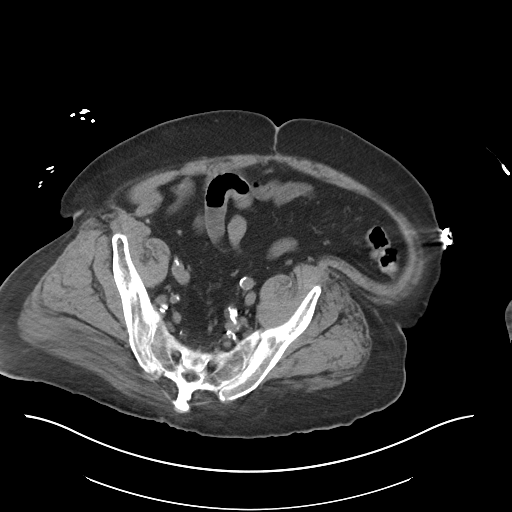
[im 43/95  soft-tissue]
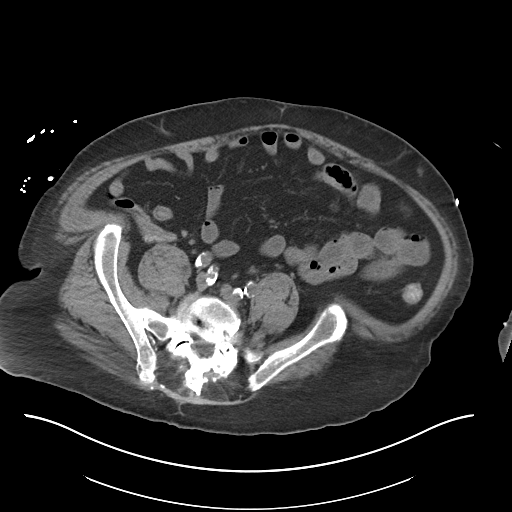
[im 48/95  soft-tissue]
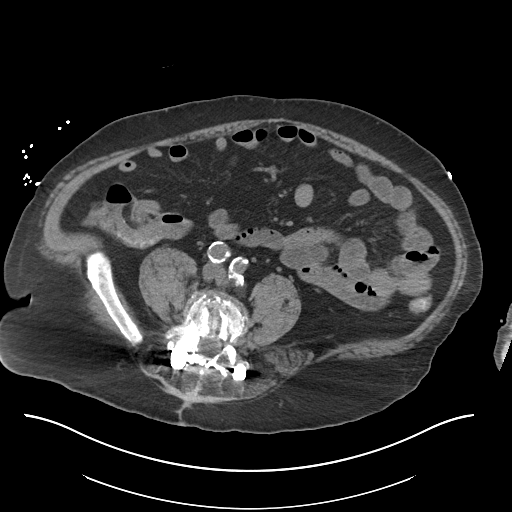
[im 52/95  soft-tissue]
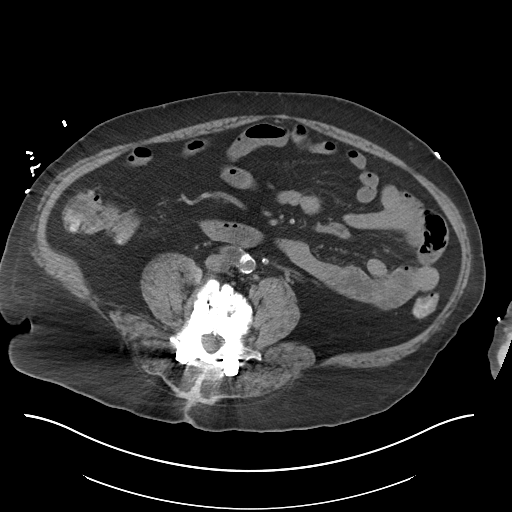
[im 62/95  soft-tissue]
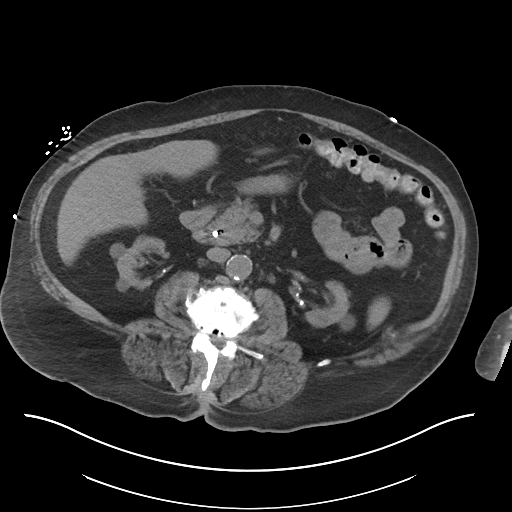
[im 62/95  bone]
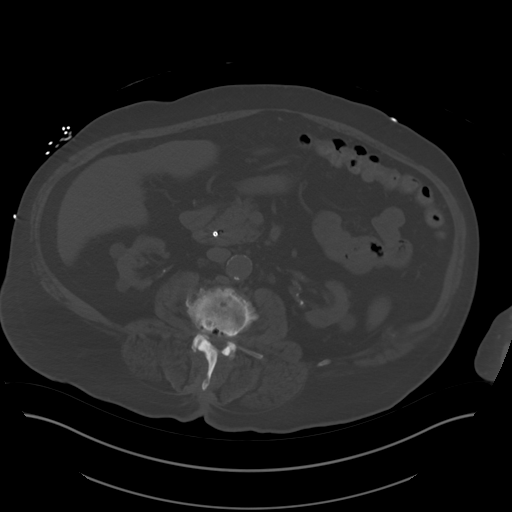
[im 66/95  soft-tissue]
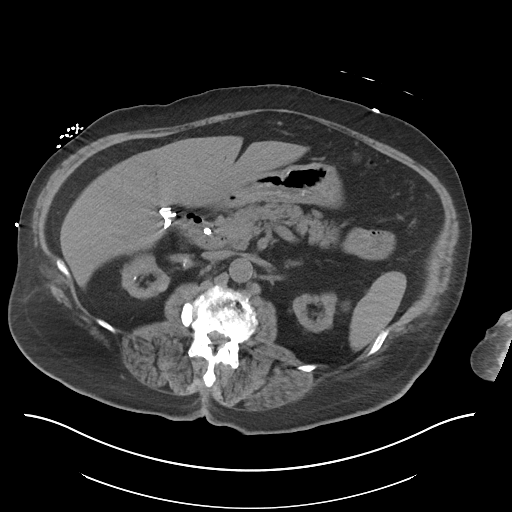
[im 76/95  soft-tissue]
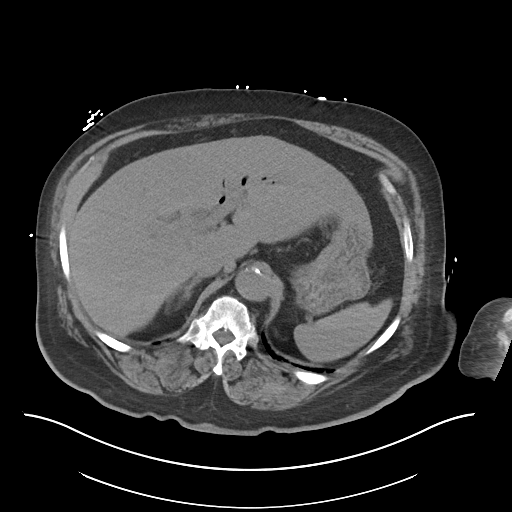
[im 80/95  soft-tissue]
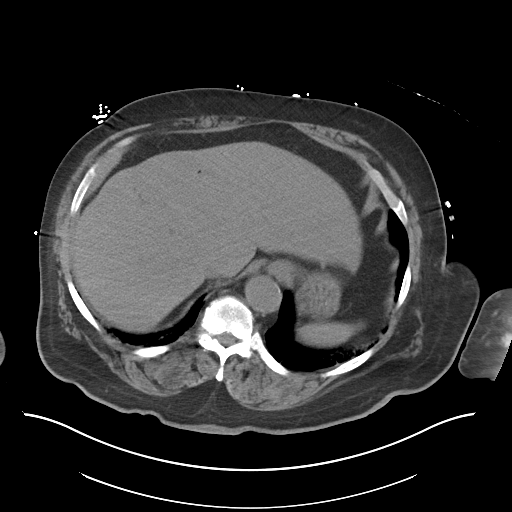
[im 90/95  soft-tissue]
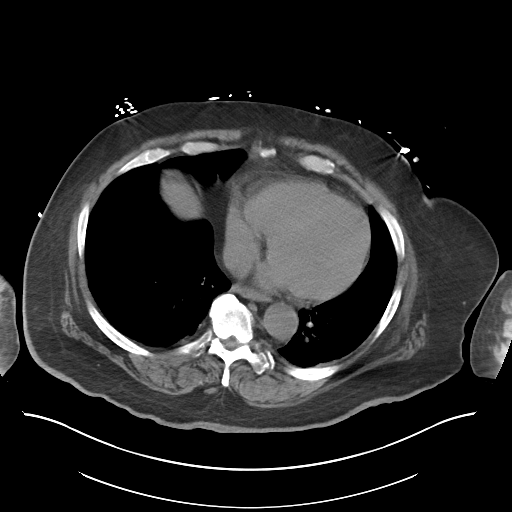

[Series 6: cor · coronal · 0.88mm/px · 3 of 110 slices shown]
[im 37/110  soft-tissue]
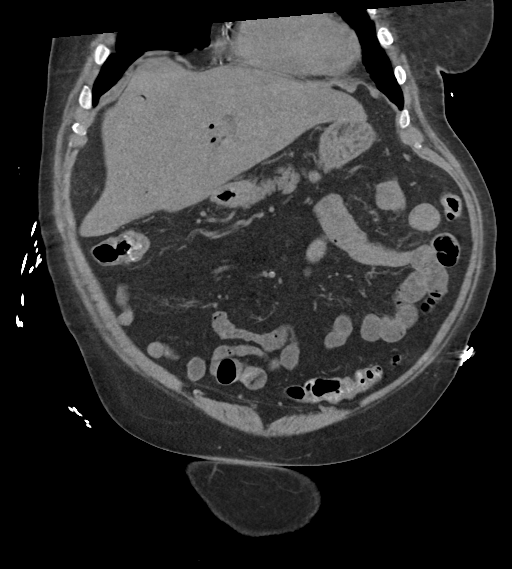
[im 49/110  soft-tissue]
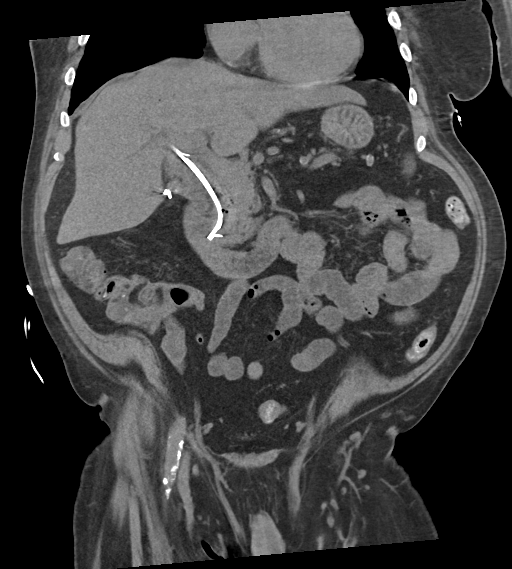
[im 61/110  soft-tissue]
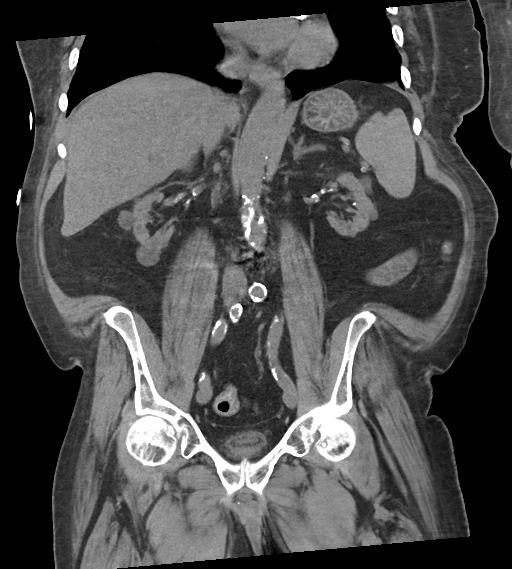

[16 of 46 positions shown; findings below may reference images not displayed]

FINDINGS: Lower chest: Minimal bilateral dependent atelectasis or scarring,
without significant change. Stable 6 mm partially calcified
subpleural nodule in the lateral aspect of the right upper lobe,
anteriorly, on image number 7 series 5. Borderline enlarged heart.
Atheromatous coronary artery calcifications.

Hepatobiliary: Intrahepatic biliary air is again demonstrated with
stable mild dilatation of the intrahepatic ducts. A biliary stent
extending from the proximal common duct into the duodenum is
currently demonstrated without the previously demonstrated
extrahepatic biliary ductal dilatation. Cholecystectomy clips.

Pancreas: Unremarkable. No pancreatic ductal dilatation or
surrounding inflammatory changes.

Spleen: Normal in size without focal abnormality.

Adrenals/Urinary Tract: Normal appearing adrenal glands. The kidneys
remains small, containing multiple cysts and vascular
calcifications. No significant urine in the urinary bladder. No
visible ureteral abnormalities.

Stomach/Bowel: Stomach is within normal limits. Appendix appears
normal. No evidence of bowel wall thickening, distention, or
inflammatory changes.

Vascular/Lymphatic: Atheromatous arterial calcifications without
aneurysm. No enlarged lymph nodes.

Reproductive: Prostate is unremarkable.

Other: No abdominal wall hernia or abnormality. No abdominopelvic
ascites.

Musculoskeletal: Ray cages at the L3-4 level and solid interbody
bone and pedicle screw and rod fusion at the L4-5 level. Lumbar and
lower thoracic spine degenerative changes.
IMPRESSION: No acute abnormality.

## 2020-05-24 IMAGING — RF DG ERCP WO/W SPHINCTEROTOMY
1 series · 4 of 4 positions shown · non-contrast
Comparison: None

CLINICAL DATA: 68-year-old male with a history of
choledocholithiasis

EXAM:
ERCP
TECHNIQUE: Multiple spot images obtained with the fluoroscopic device and
submitted for interpretation post-procedure.
FLUOROSCOPY TIME:  Fluoroscopy Time:  6 minutes 6 seconds

[Series 1: unknown protocol · 0.20mm/px · 4 of 4 slices shown]
[im 1/4]
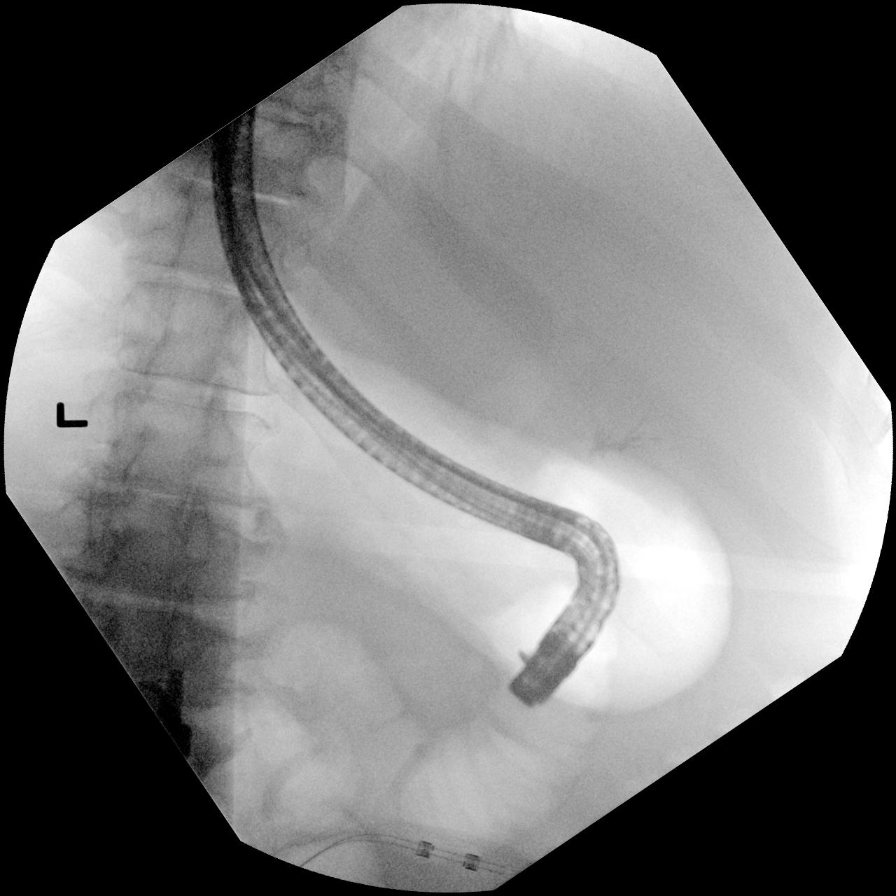
[im 2/4]
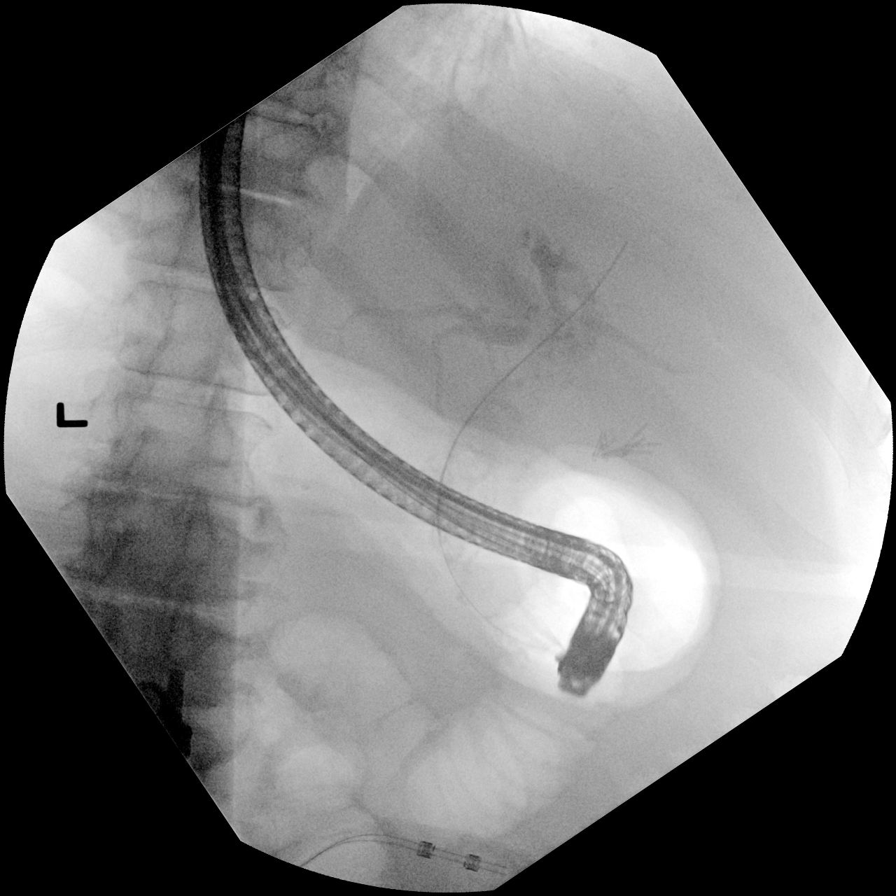
[im 3/4]
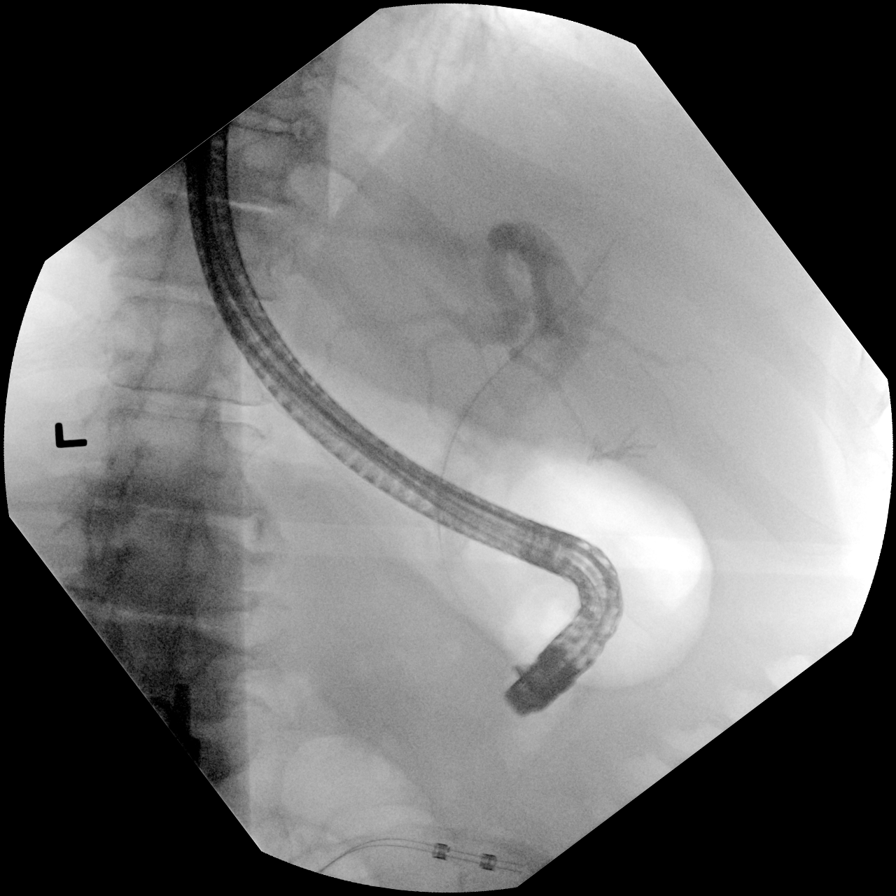
[im 4/4]
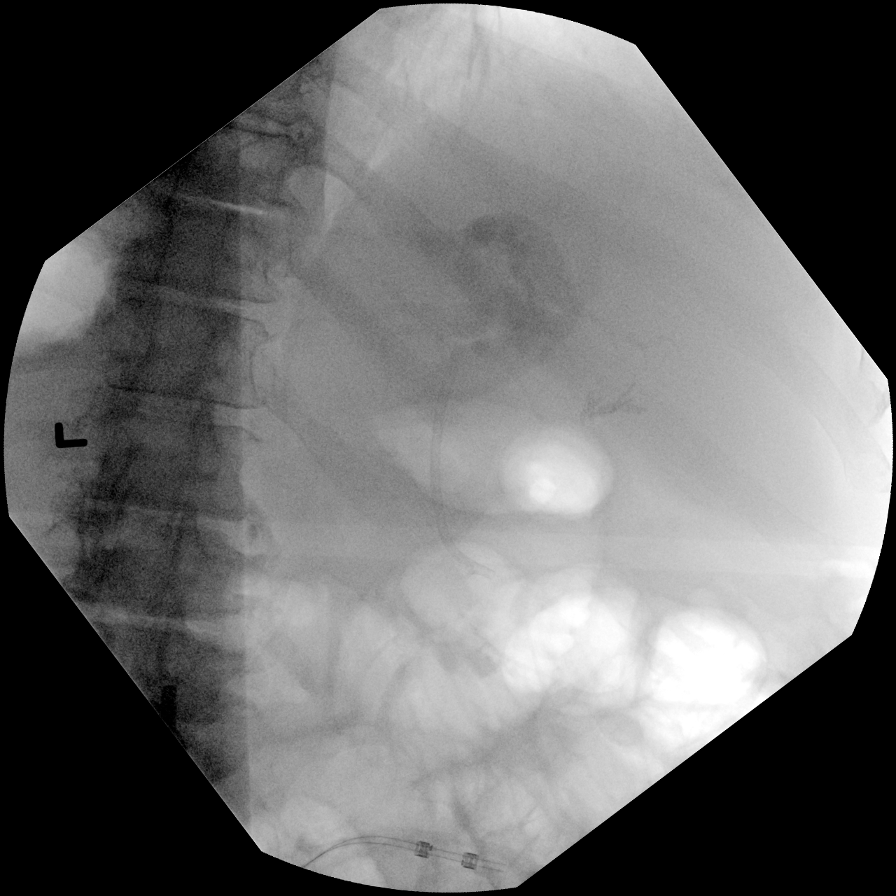

[4 of 4 positions shown; findings below may reference images not displayed]

FINDINGS: Limited intraoperative fluoroscopic spot images during ERCP.

Initial image demonstrates endoscope projecting over the upper
abdomen. There is then cannulation of the ampulla and retrograde
infusion of contrast, with incomplete opacification of the
extrahepatic biliary system.

Final image demonstrates placement of a plastic biliary stent.

Changes of cholecystectomy
IMPRESSION: Limited images during ERCP demonstrates incomplete opacification of
the extrahepatic biliary system with placement of a plastic biliary
stent.

Changes of prior cholecystectomy.

Please refer to the dictated operative report for full details of
intraoperative findings and procedure.

## 2020-06-28 IMAGING — RF DG ERCP WO/W SPHINCTEROTOMY
1 series · 10 of 10 positions shown · non-contrast
Comparison: ERCP-04/02/2019; 05/20/2019

FLUOROSCOPY TIME:  2 minutes, 55 seconds

CLINICAL DATA: CBD stone

EXAM:
ERCP
TECHNIQUE: Multiple spot images obtained with the fluoroscopic device and
submitted for interpretation post-procedure.

[Series 1: run · 10 of 10 slices shown]
[im 1/10]
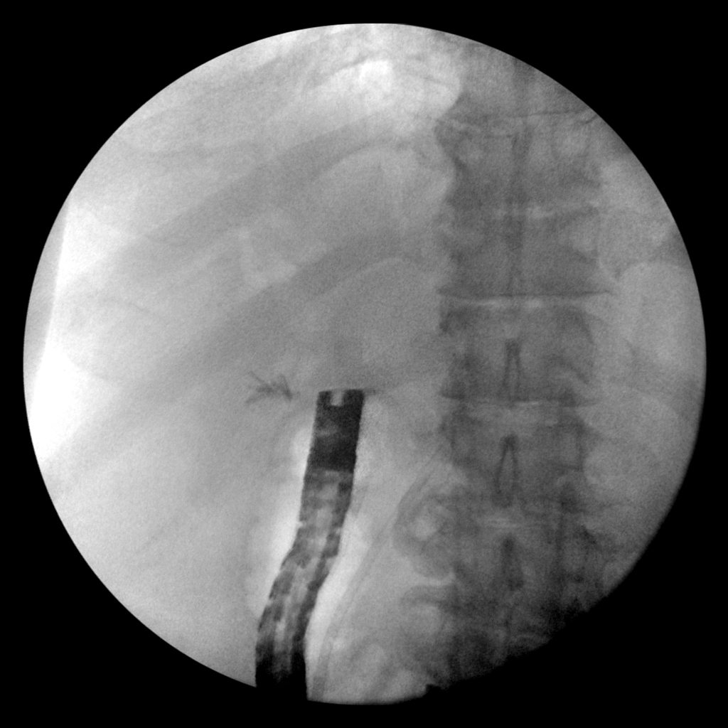
[im 2/10]
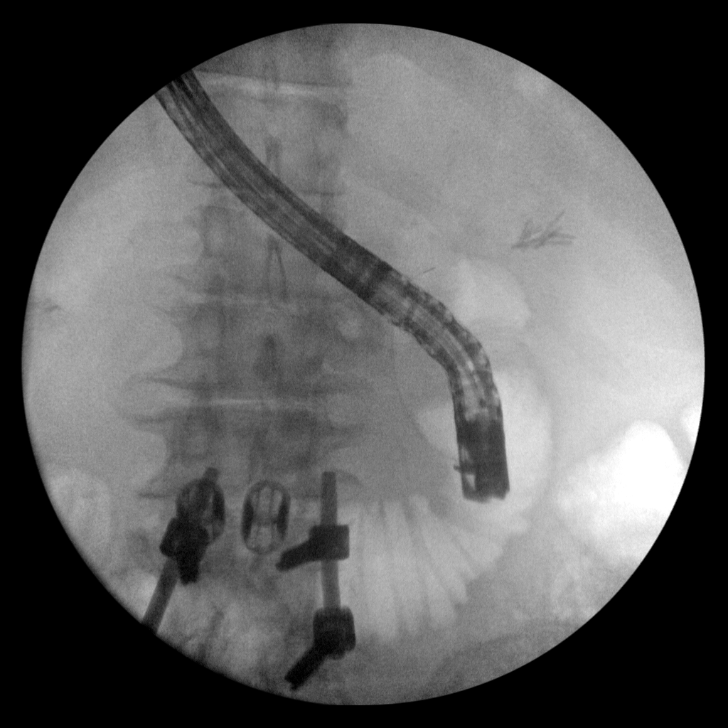
[im 3/10]
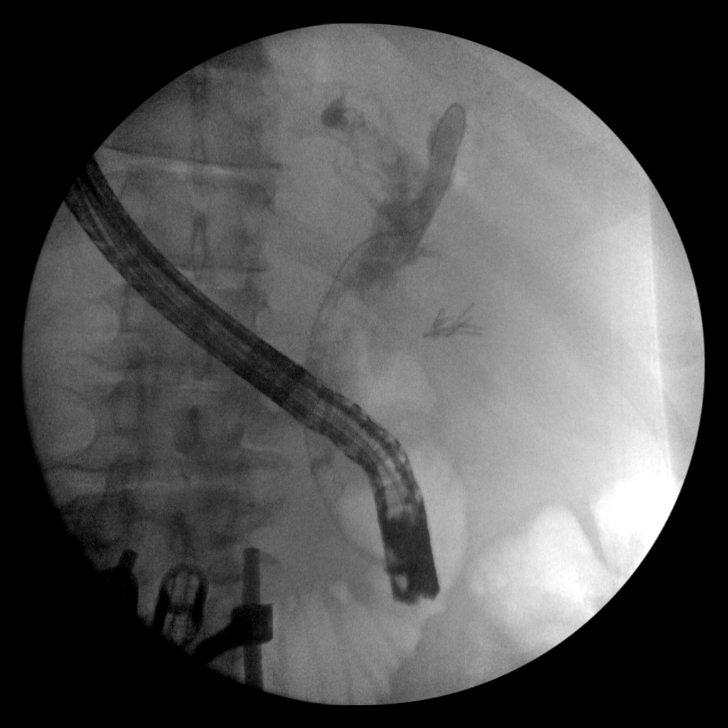
[im 4/10]
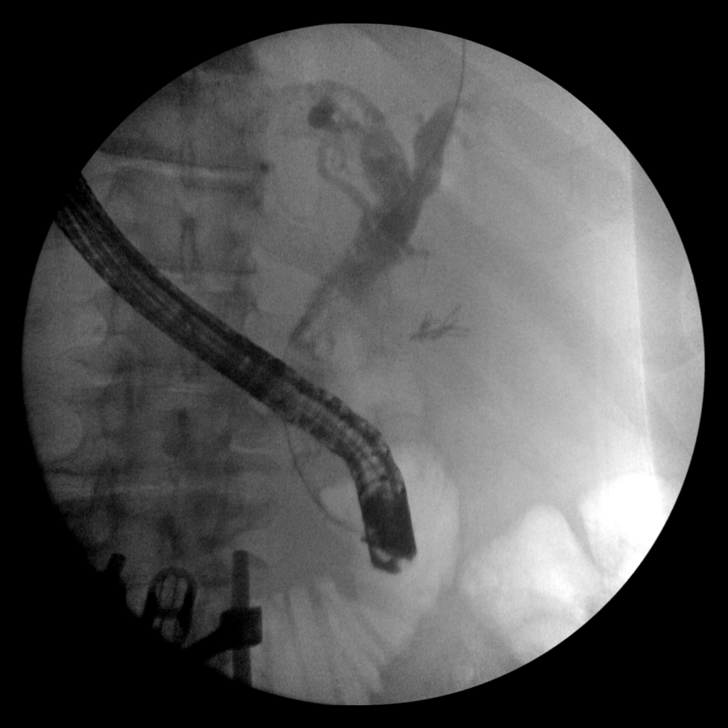
[im 5/10]
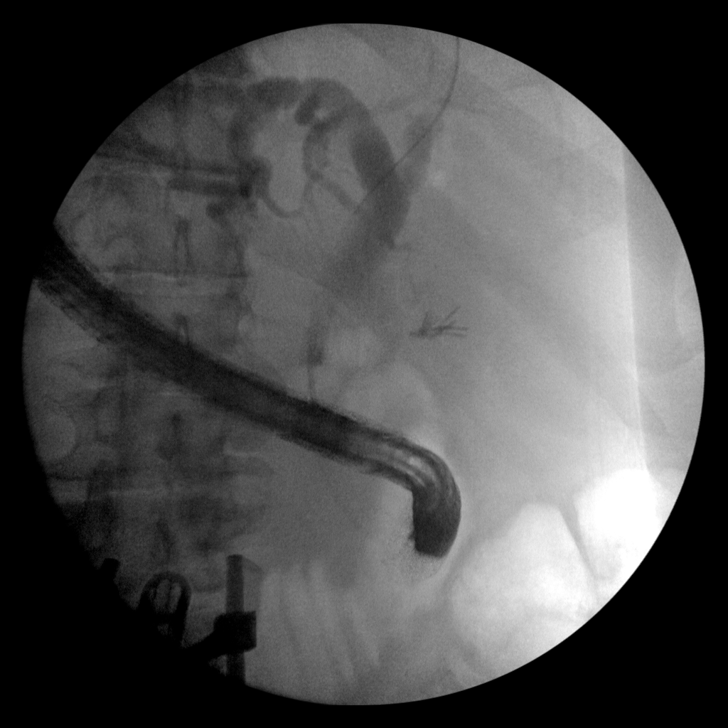
[im 6/10]
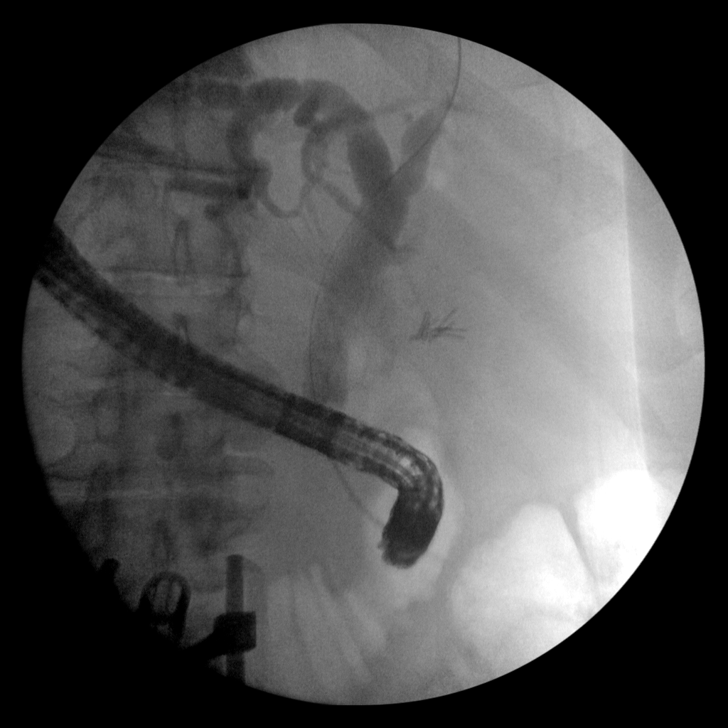
[im 7/10]
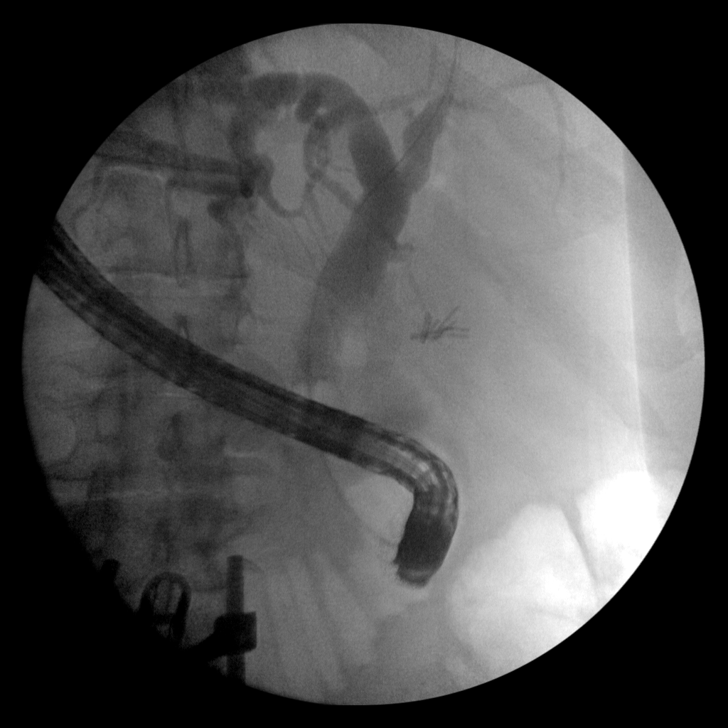
[im 8/10]
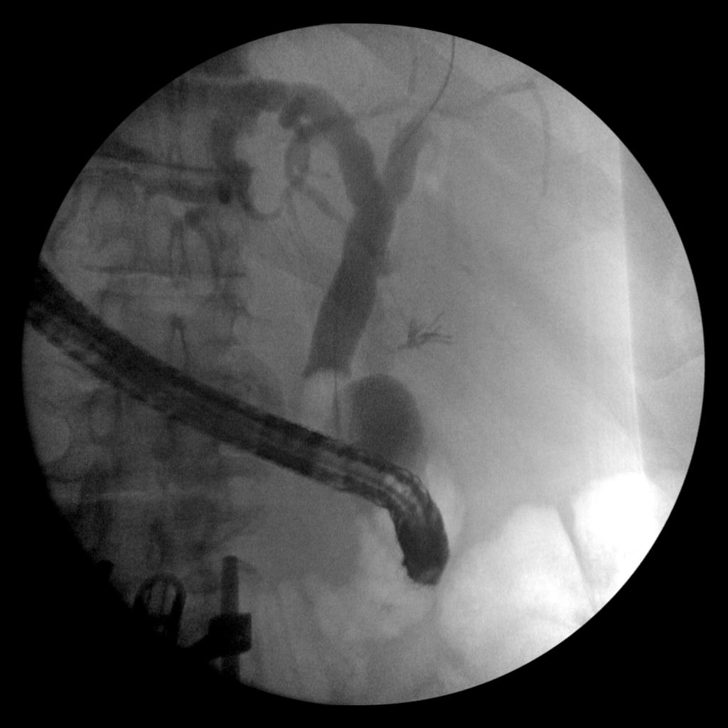
[im 9/10]
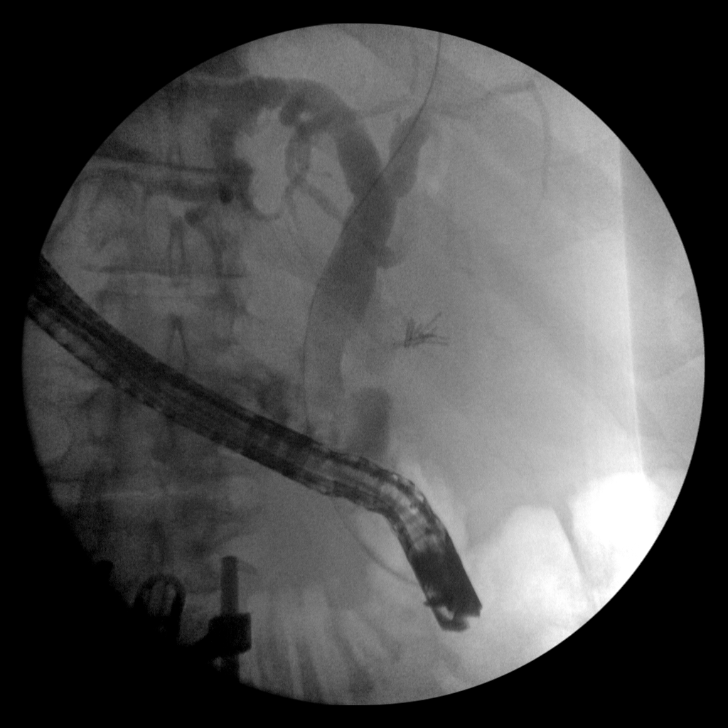
[im 10/10]
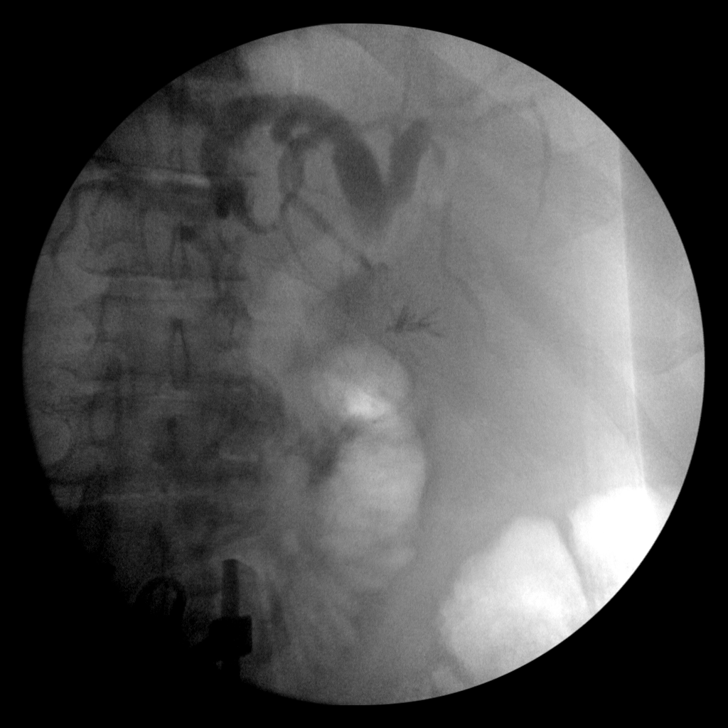

[10 of 10 positions shown; findings below may reference images not displayed]

FINDINGS: Single spot fluoroscopic image of the right upper abdominal quadrant
demonstrates an ERCP probe overlying the right upper abdominal
quadrant.

Cholecystectomy clips overlies expected location of the gallbladder
fossa. An internal biliary stent is seen overlying the right upper
abdominal quadrant.

Subsequent images demonstrate interval removal of the internal
biliary stent with selective cannulation and opacification of the
CBD which appears markedly dilated with multiple ill-defined filling
defects worrisome for choledocholithiasis and or biliary sludge
(image 4).

Subsequent images demonstrate insufflation of a balloon within the
proximal/mid aspect of the CBD with subsequent sweeping and presumed
biliary sphincterotomy.

There is opacification of the central aspect of the intrahepatic
biliary tree which appears moderately dilated. There is minimal
opacification of the residual cystic duct without contrast
extravasation. No definitive opacification of the pancreatic duct.
IMPRESSION: ERCP with biliary stent removal, biliary sweeping and sphincterotomy
as above.

These images were submitted for radiologic interpretation only.
Please see the procedural report for the amount of contrast and the
fluoroscopy time utilized.

## 2020-08-26 IMAGING — CT CT ANGIO CHEST-ABD-PELV FOR DISSECTION W/ AND WO/W CM
2 of 7 series · 13 of 46 positions shown, 15 images · IV contrast (OMNI 350)
Comparison: CT of the abdomen pelvis dated 07/31/2019.

CLINICAL DATA: 68-year-old male with history of kidney disease and
vascular disease. Evaluate for possible dissection.

EXAM:
CT ANGIOGRAPHY CHEST, ABDOMEN AND PELVIS
TECHNIQUE: Multidetector CT imaging through the chest, abdomen and pelvis was
performed using the standard protocol during bolus administration of
intravenous contrast. Multiplanar reconstructed images and MIPs were
obtained and reviewed to evaluate the vascular anatomy.
CONTRAST:  100mL OMNIPAQUE IOHEXOL 350 MG/ML SOLN

[Series 7: dissection 2mm · axial · 0.98mm/px · z∈[-648,-20]mm · 10 of 352 slices shown, 12 images]
[im 19/352  soft-tissue]
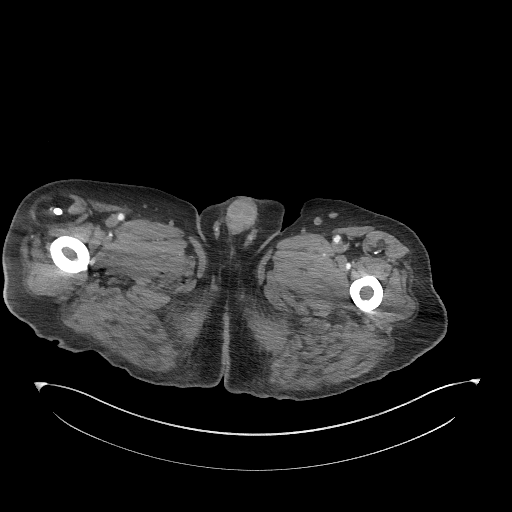
[im 19/352  bone]
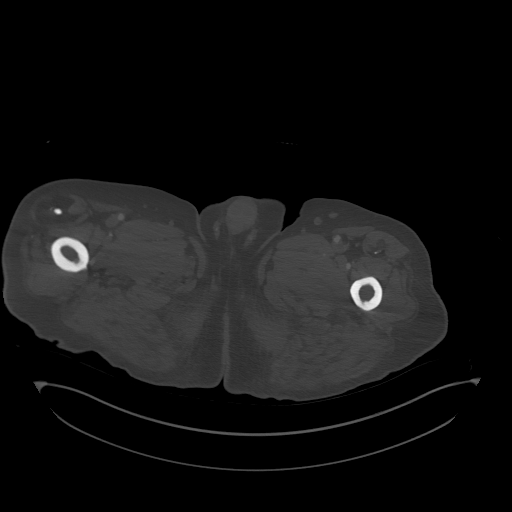
[im 56/352  soft-tissue]
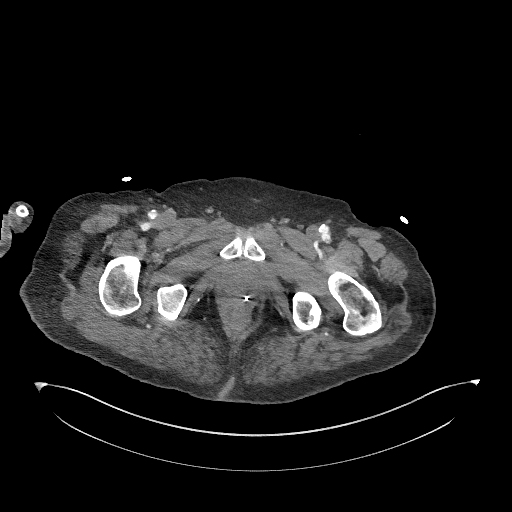
[im 93/352  soft-tissue]
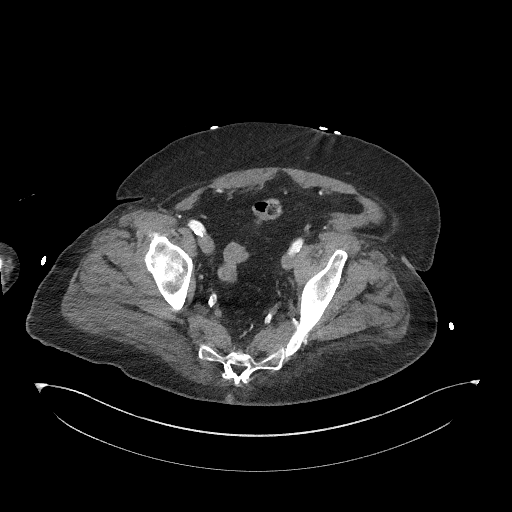
[im 130/352  soft-tissue]
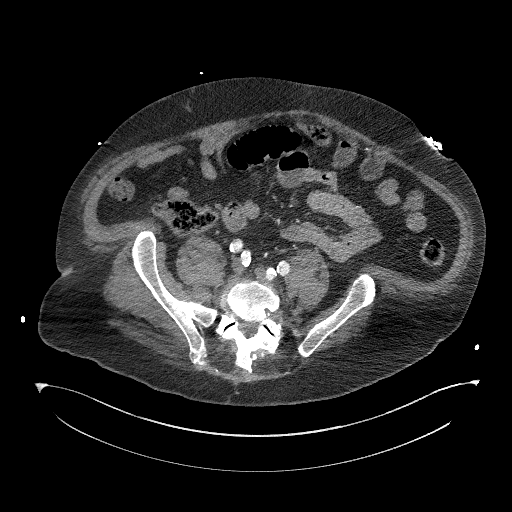
[im 167/352  soft-tissue]
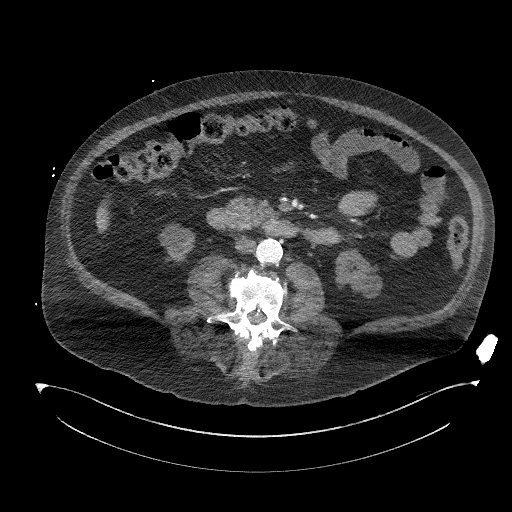
[im 185/352  soft-tissue]
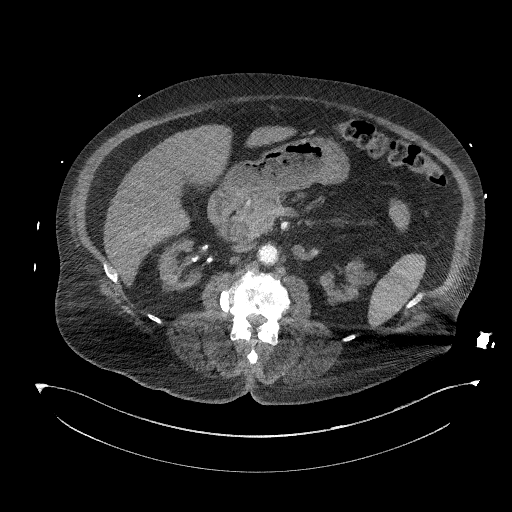
[im 222/352  soft-tissue]
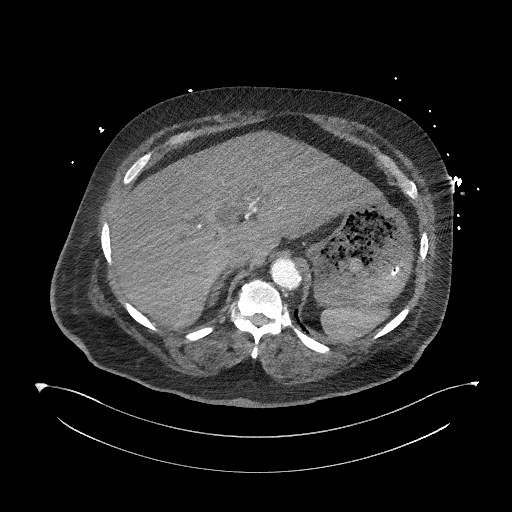
[im 259/352  soft-tissue]
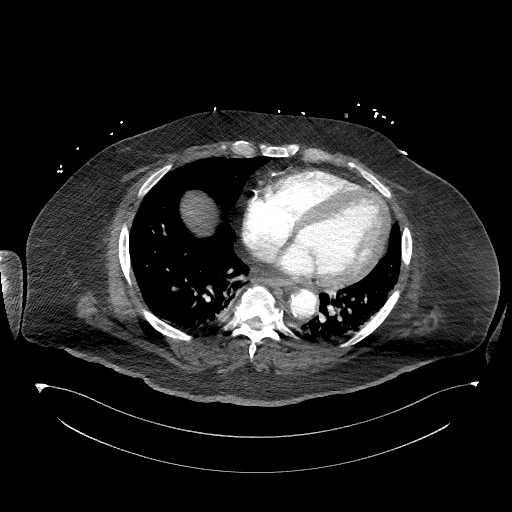
[im 296/352  soft-tissue]
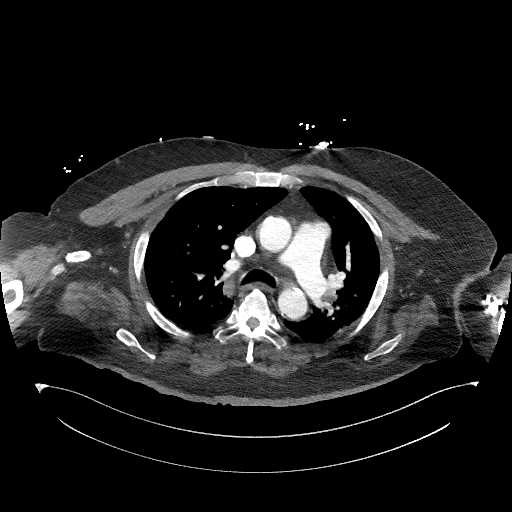
[im 296/352  bone]
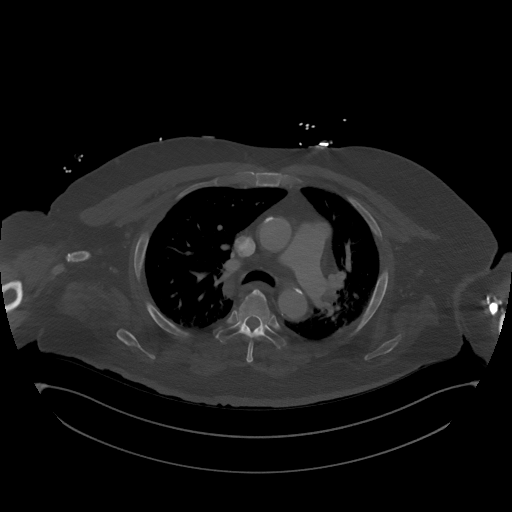
[im 333/352  soft-tissue]
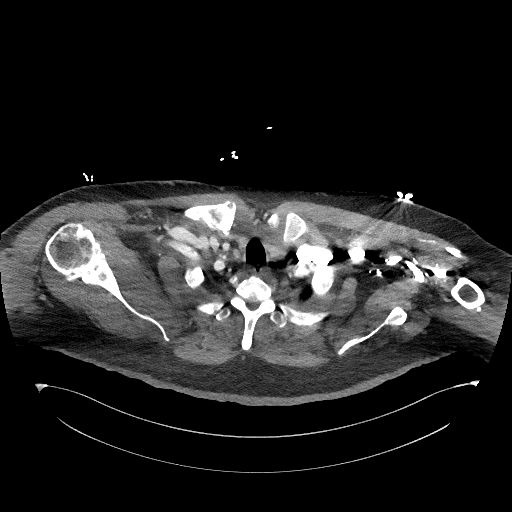

[Series 10: dissection 2mm cor · coronal · 1.03mm/px · 3 of 195 slices shown]
[im 49/195  soft-tissue]
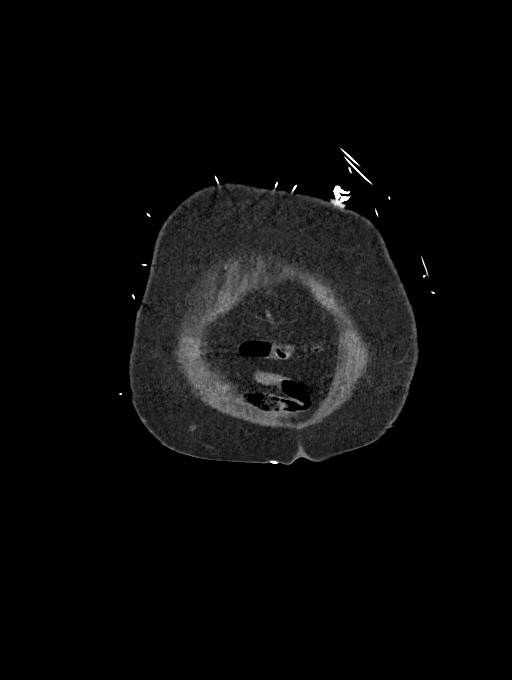
[im 98/195  soft-tissue]
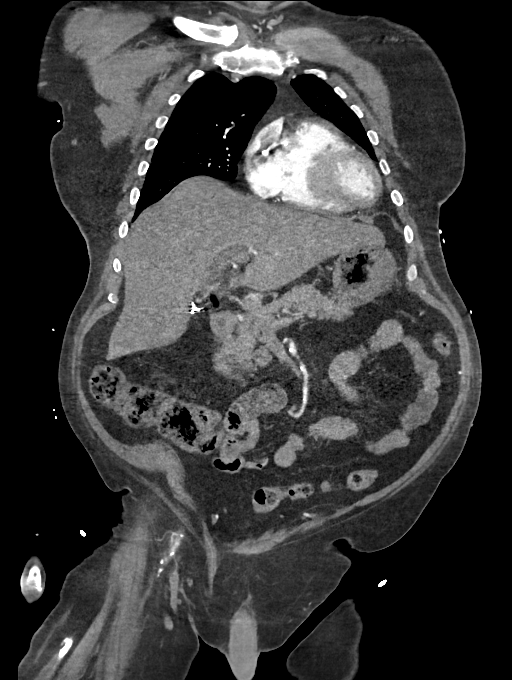
[im 146/195  soft-tissue]
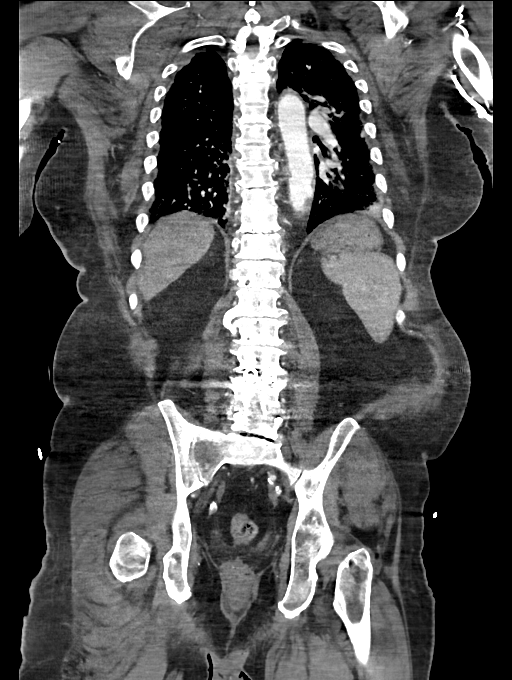

[13 of 46 positions shown; findings below may reference images not displayed]

FINDINGS: CTA CHEST FINDINGS

Cardiovascular: There is no cardiomegaly or pericardial effusion.
Advanced 3 vessel coronary vascular calcification noted. There is
advanced calcified and noncalcified atherosclerotic plaque of the
aorta. No aneurysmal dilatation or dissection. The origins of the
great vessels of the aortic arch appear patent as visualized.
Evaluation of the pulmonary arteries is limited due to streak
artifact caused by patient's arms and suboptimal opacification and
timing of the contrast. No large or central pulmonary artery embolus
identified.

There is a stent in the distal right subclavian vein. There is
narrowing of the right innominate vein.

Mediastinum/Nodes: There is no hilar or mediastinal adenopathy. The
esophagus is grossly unremarkable. No mediastinal fluid collection.

Lungs/Pleura: There are bibasilar subpleural densities, likely
atelectasis/scarring. No focal consolidation, pleural effusion, or
pneumothorax. There is a 5 mm right middle lobe subpleural nodule
(series 6 image 39).

Musculoskeletal: Multilevel degenerative changes of the spine. No
acute osseous pathology. Partially visualized lower cervical ACDF.

Review of the MIP images confirms the above findings.

CTA ABDOMEN AND PELVIS FINDINGS

VASCULAR

Aorta: Advanced atherosclerotic disease. No aneurysmal dilatation or
dissection. There is a 2.3 cm infrarenal aortic ectasia.

Celiac: There is atherosclerotic calcification of the origin of the
celiac axis with approximately 50% narrowing. The celiac artery and
its major branches remain patent.

SMA: There is atherosclerotic calcification of the origin of the
SMA. Noncalcified plaques noted in the wall of the proximal SMA. The
SMA remains patent.

Renals: Atherosclerotic calcification of the origins of the renal
arteries. There is severely diminished flow within the renal
arteries.

IMA: The IMA is patent.

Inflow: Advanced atherosclerotic calcification of the iliac
arteries. No aneurysmal dilatation or dissection. The iliac arteries
are patent.

Veins: No obvious venous abnormality within the limitations of this
arterial phase study.

Review of the MIP images confirms the above findings.

NON-VASCULAR

No intra-abdominal free air or free fluid.

Hepatobiliary: Diffuse fatty infiltration of the liver. There is
mild intrahepatic biliary ductal dilatation. Cholecystectomy. There
is air within the cystic duct and central biliary trees. Small
scattered linear air in the periphery of the liver most likely
biliary air. Portal venous air is less likely. There may be sludge
in the central CBD (series 7, image 168). Evaluation is very limited
due to streak artifact caused by patient's arms.

Pancreas: Unremarkable. No pancreatic ductal dilatation or
surrounding inflammatory changes.

Spleen: Normal in size without focal abnormality.

Adrenals/Urinary Tract: The adrenal glands are unremarkable.
Atrophic kidneys. Several bilateral renal hypodense lesions
measuring up to 2.7 cm on the left. The larger lesions demonstrate
fluid attenuation consistent with cysts and the smaller lesions are
not characterized. There is no hydronephrosis on either side.
Vascular calcifications noted. The visualized ureters are grossly
unremarkable. The urinary bladder is collapsed.

Stomach/Bowel: Scattered colonic diverticula without active
inflammatory changes. There is no bowel obstruction or active
inflammation. The appendix is normal. There is a small hiatal
hernia. There is a 3.5 x 2.0 cm fatty ingested particle in the
distal esophagus concerning for an impacted particle.

Lymphatic: No adenopathy.

Reproductive: The prostate and seminal vesicles are grossly
unremarkable. Several surgical clips or fiducial markers noted in
the region of the prostate gland.

Other: None

Musculoskeletal: Multilevel degenerative changes and lower lumbar
fusion. No acute osseous pathology.

Review of the MIP images confirms the above findings.
IMPRESSION: 1. No CT evidence of aortic aneurysm or dissection. Severe Aortic
Atherosclerosis ([id number]).
2. Ingested fatty particle in the distal esophagus concerning for a
degree of impaction. Clinical correlation is recommended.
3. Cholecystectomy with dilatation of the common bile duct and
intrahepatic biliary trees. Some sludge may be present in the distal
CBD.
4. Pneumobilia.
5. Colonic diverticulosis. No bowel obstruction or active
inflammation. Normal appendix.

## 2020-08-27 IMAGING — DX DG CHEST 1V
1 series · 1 of 1 positions shown · non-contrast
Comparison: CT 11/05/2019.  Chest x-ray 07/31/2019.

CLINICAL DATA: Increased shortness of breath.

EXAM:
CHEST  1 VIEW

[chest ap]
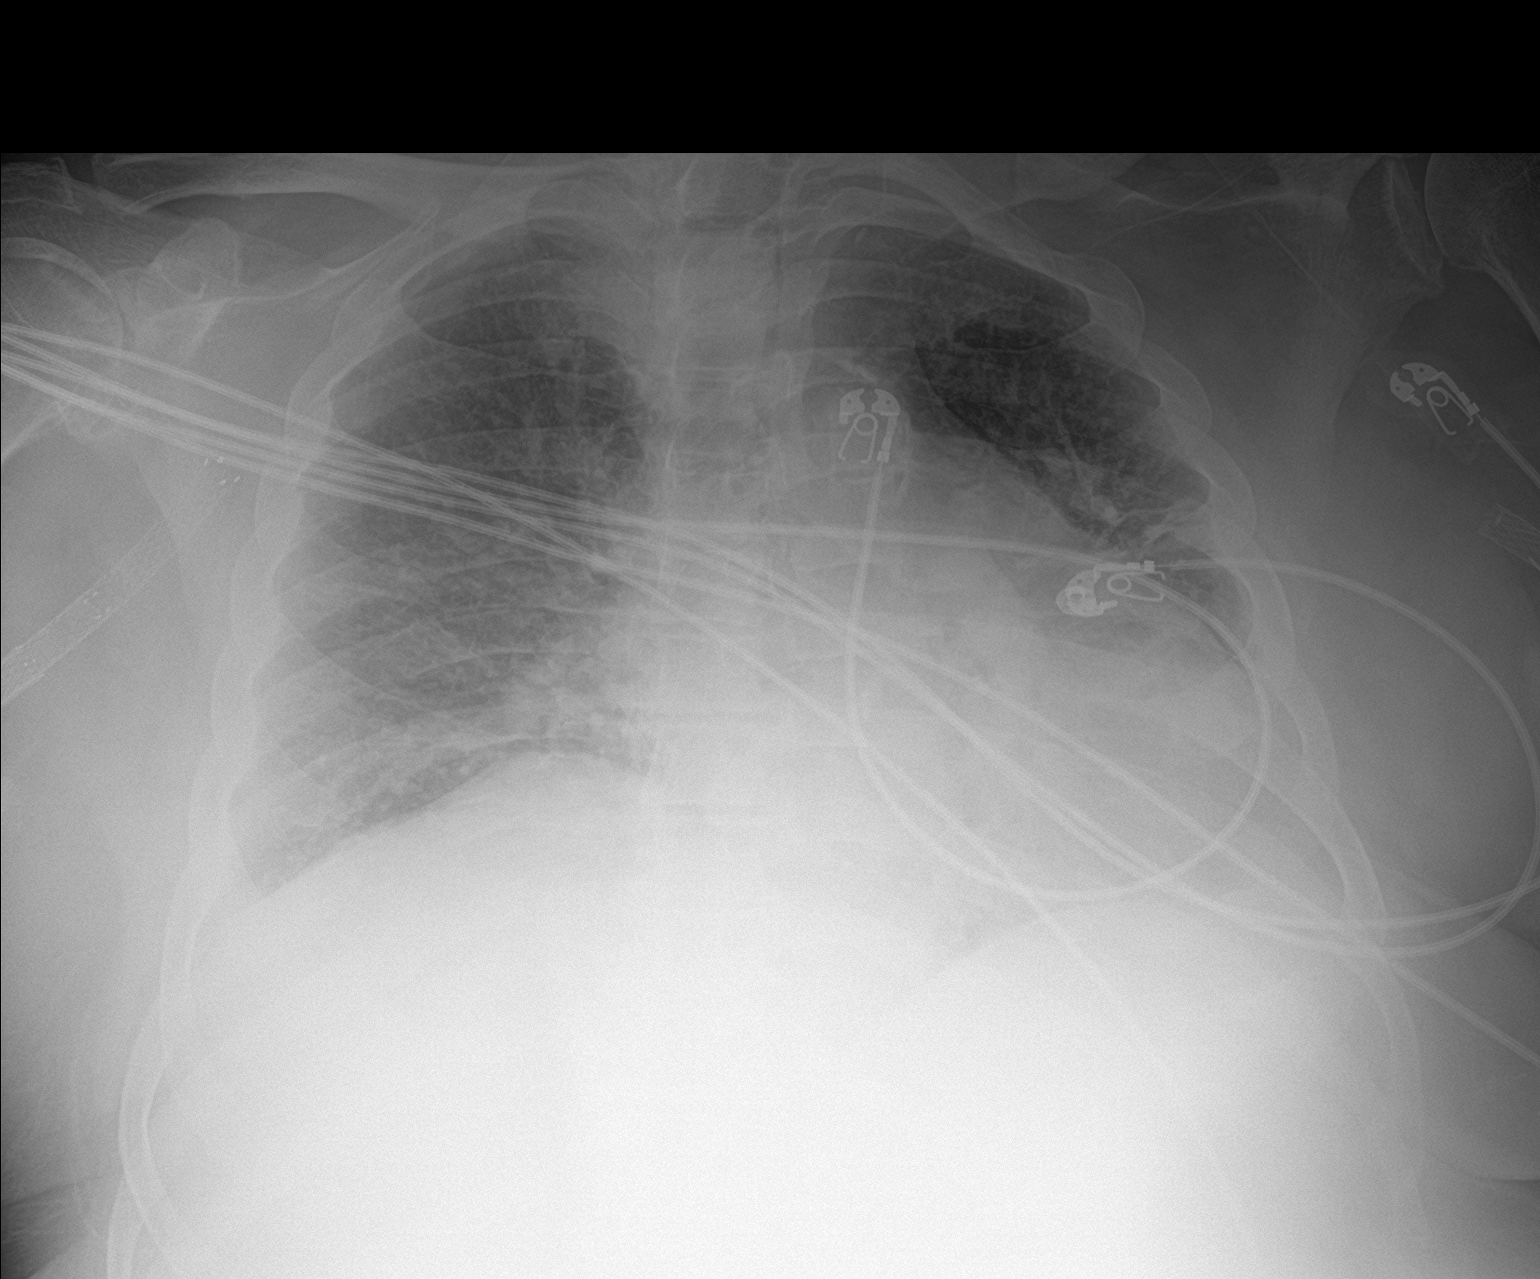

[1 of 1 positions shown; findings below may reference images not displayed]

FINDINGS: Cardiomegaly. Bilateral interstitial prominence. Tiny bilateral
pleural effusions cannot be excluded. No pneumothorax. Prior
cervical spine fusion.
IMPRESSION: Cardiomegaly with bilateral interstitial prominence suggesting CHF.
Pneumonitis cannot be excluded. Tiny bilateral pleural effusions
cannot be excluded.

## 2020-08-27 IMAGING — CT CT HEAD W/O CM
3 series · 17 of 30 positions shown, 19 images · non-contrast
Comparison: Head CT 07/31/2019 and earlier.

CLINICAL DATA: 68-year-old male with encephalopathy.

EXAM:
CT HEAD WITHOUT CONTRAST
TECHNIQUE: Contiguous axial images were obtained from the base of the skull
through the vertex without intravenous contrast.

[Series 4: head bone · axial · 0.45mm/px · z∈[-185,-57]mm · 7 of 88 slices shown]
[im 6/88  bone]
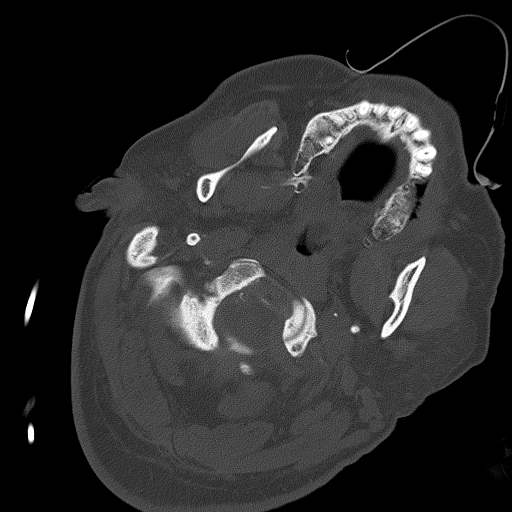
[im 18/88  bone]
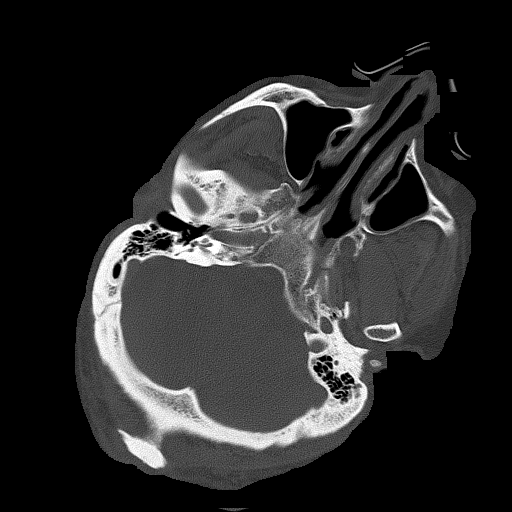
[im 30/88  bone]
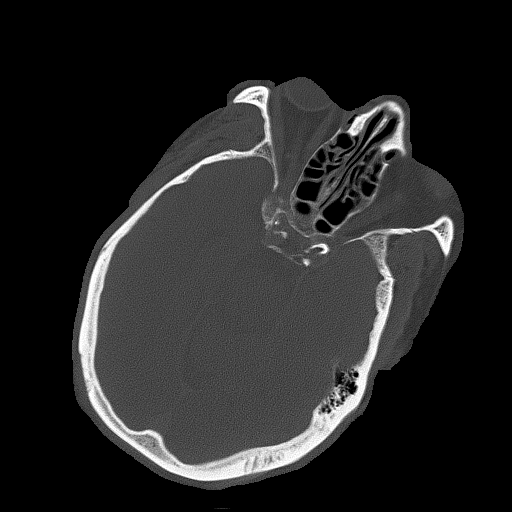
[im 41/88  bone]
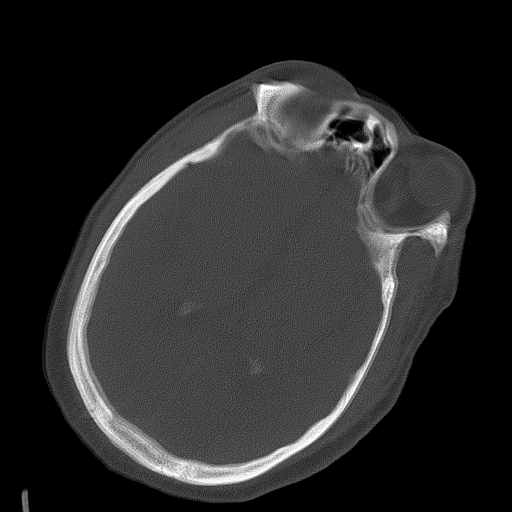
[im 47/88  bone]
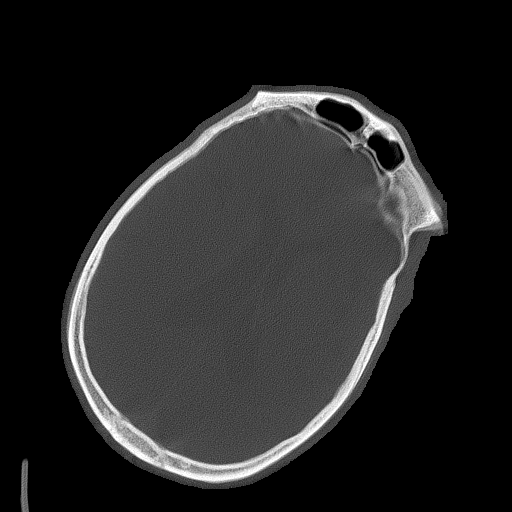
[im 59/88  bone]
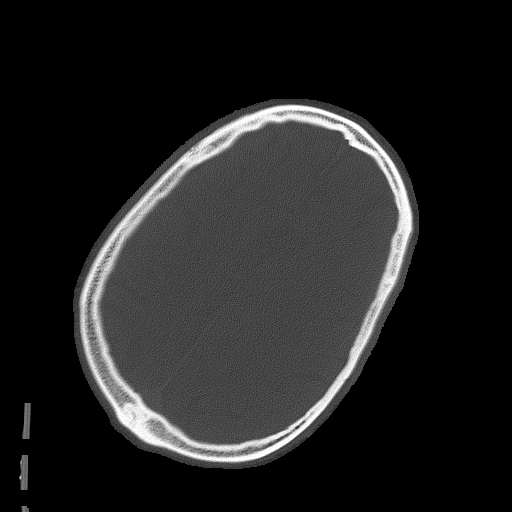
[im 70/88  bone]
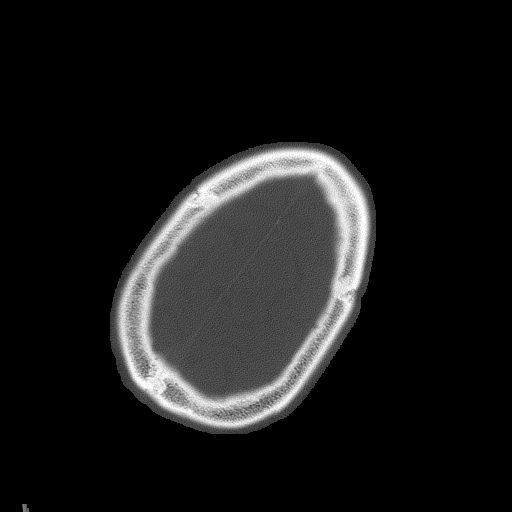

[Series 6: head without · axial · non-contrast · 0.45mm/px · z∈[-170,-50]mm · 5 of 36 slices shown]
[im 6/36  brain]
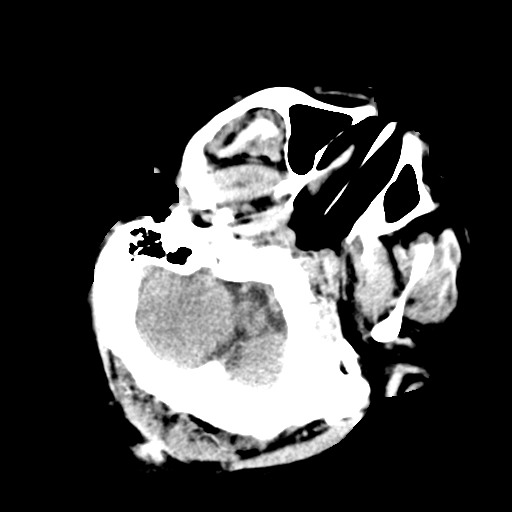
[im 12/36  brain]
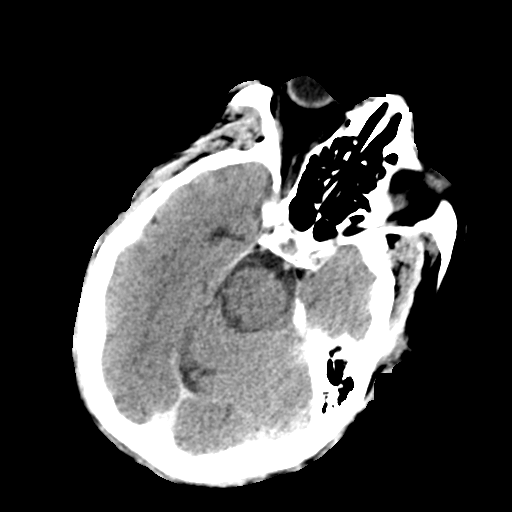
[im 18/36  brain]
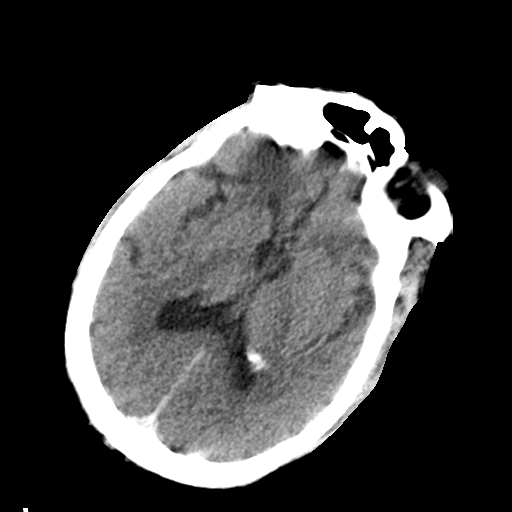
[im 24/36  brain]
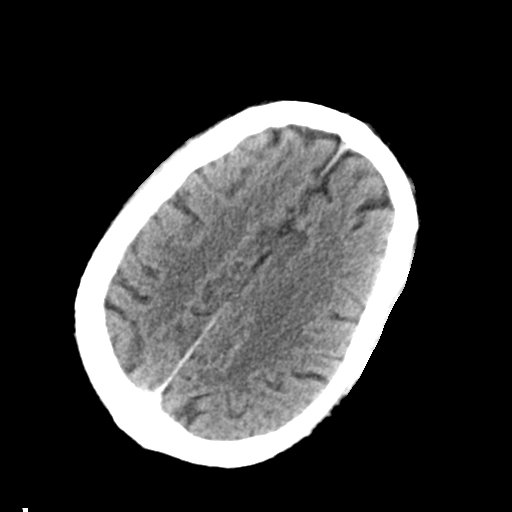
[im 30/36  brain]
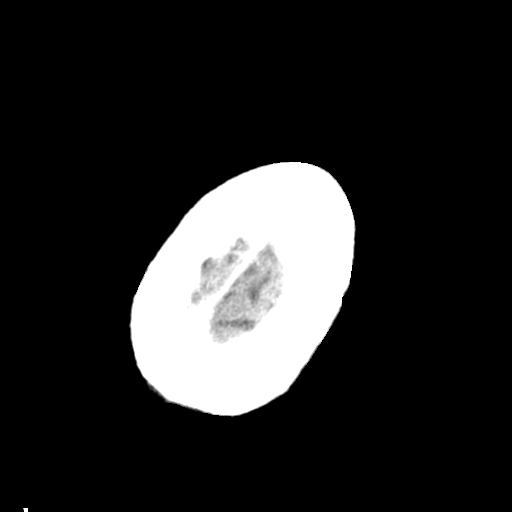

[Series 9: head without ax · axial · non-contrast · 0.45mm/px · z∈[-207,-89]mm · 5 of 38 slices shown, 7 images]
[im 7/38  brain]
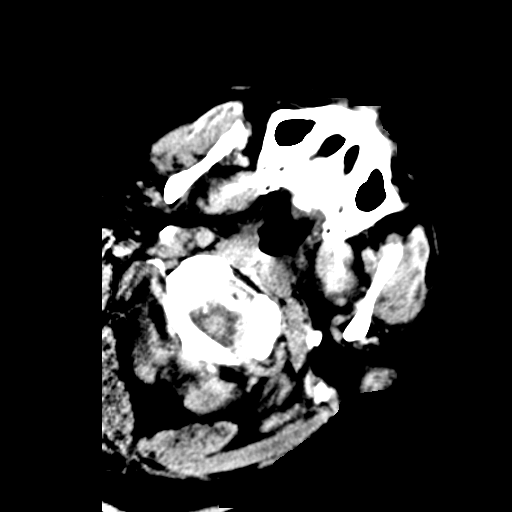
[im 7/38  bone]
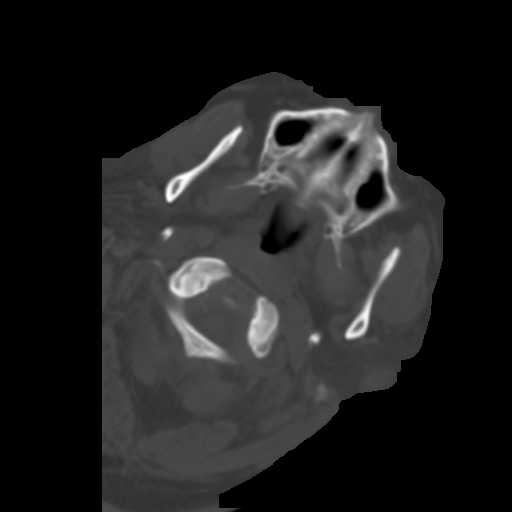
[im 13/38  brain]
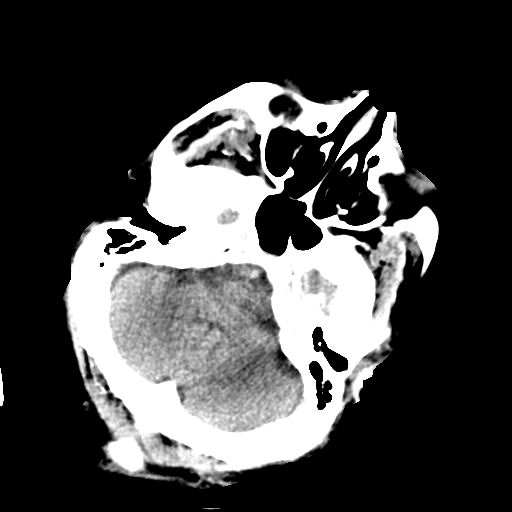
[im 19/38  brain]
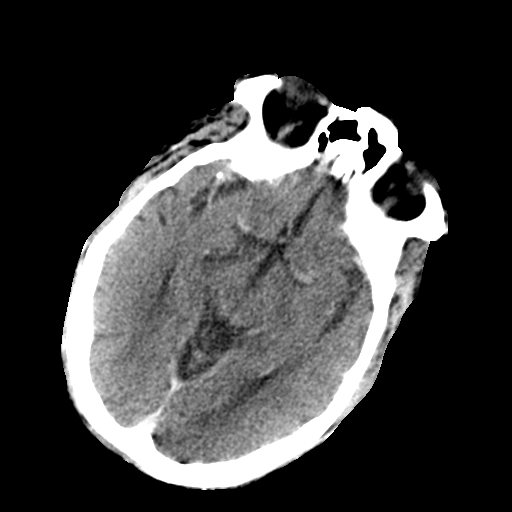
[im 25/38  brain]
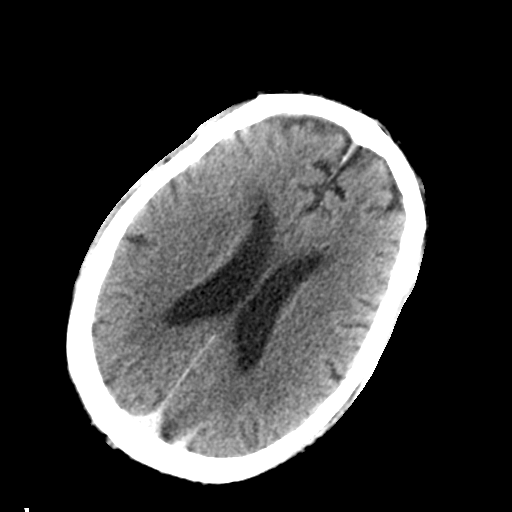
[im 31/38  brain]
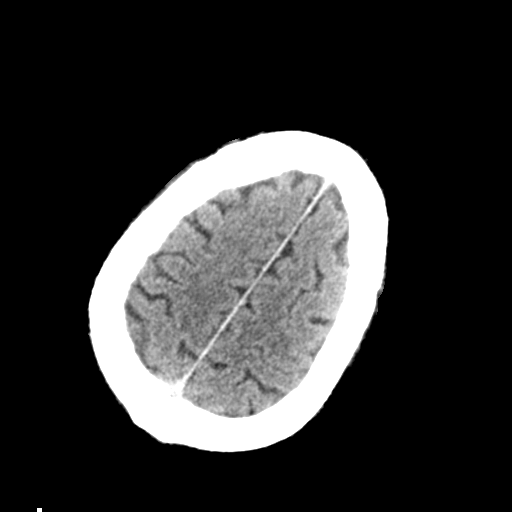
[im 31/38  bone]
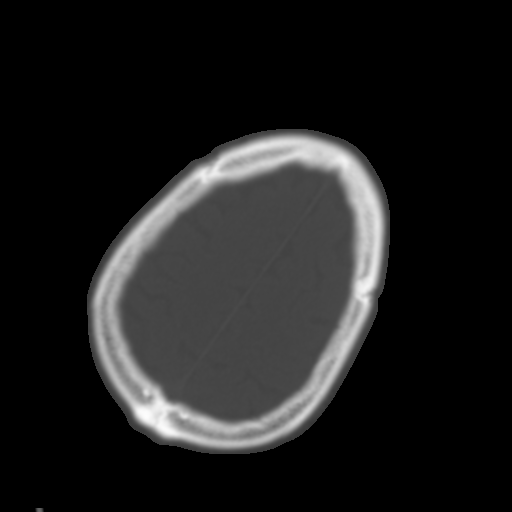

[17 of 30 positions shown; findings below may reference images not displayed]

FINDINGS: Brain: Intermittent mild motion artifact. Stable cerebral volume. No
midline shift, ventriculomegaly, mass effect, evidence of mass
lesion, intracranial hemorrhage or evidence of cortically based
acute infarction. Stable gray-white matter differentiation
throughout the brain. No cortical encephalomalacia identified

Vascular: Extensive Calcified atherosclerosis at the skull base.
There appears to be a degree of intravascular contrast present, a
chest CTA was performed earlier tonight. No abnormal enhancement
identified.

Skull: No acute osseous abnormality identified.

Sinuses/Orbits: Visualized paranasal sinuses and mastoids are stable
and well pneumatized.

Other: Stable orbit and scalp soft tissues.
IMPRESSION: 1. Mild motion artifact and some residual intravascular contrast
from the earlier CTA chest.
2. Stable CT appearance of the brain since [REDACTED] with no acute
intracranial abnormality.

## 2020-08-27 IMAGING — DX DG CHEST 1V PORT
1 series · 1 of 1 positions shown · non-contrast
Comparison: Prior chest x-ray same day.  CT 11/05/2019.

CLINICAL DATA: Intubation.

EXAM:
PORTABLE CHEST 1 VIEW

[chest]
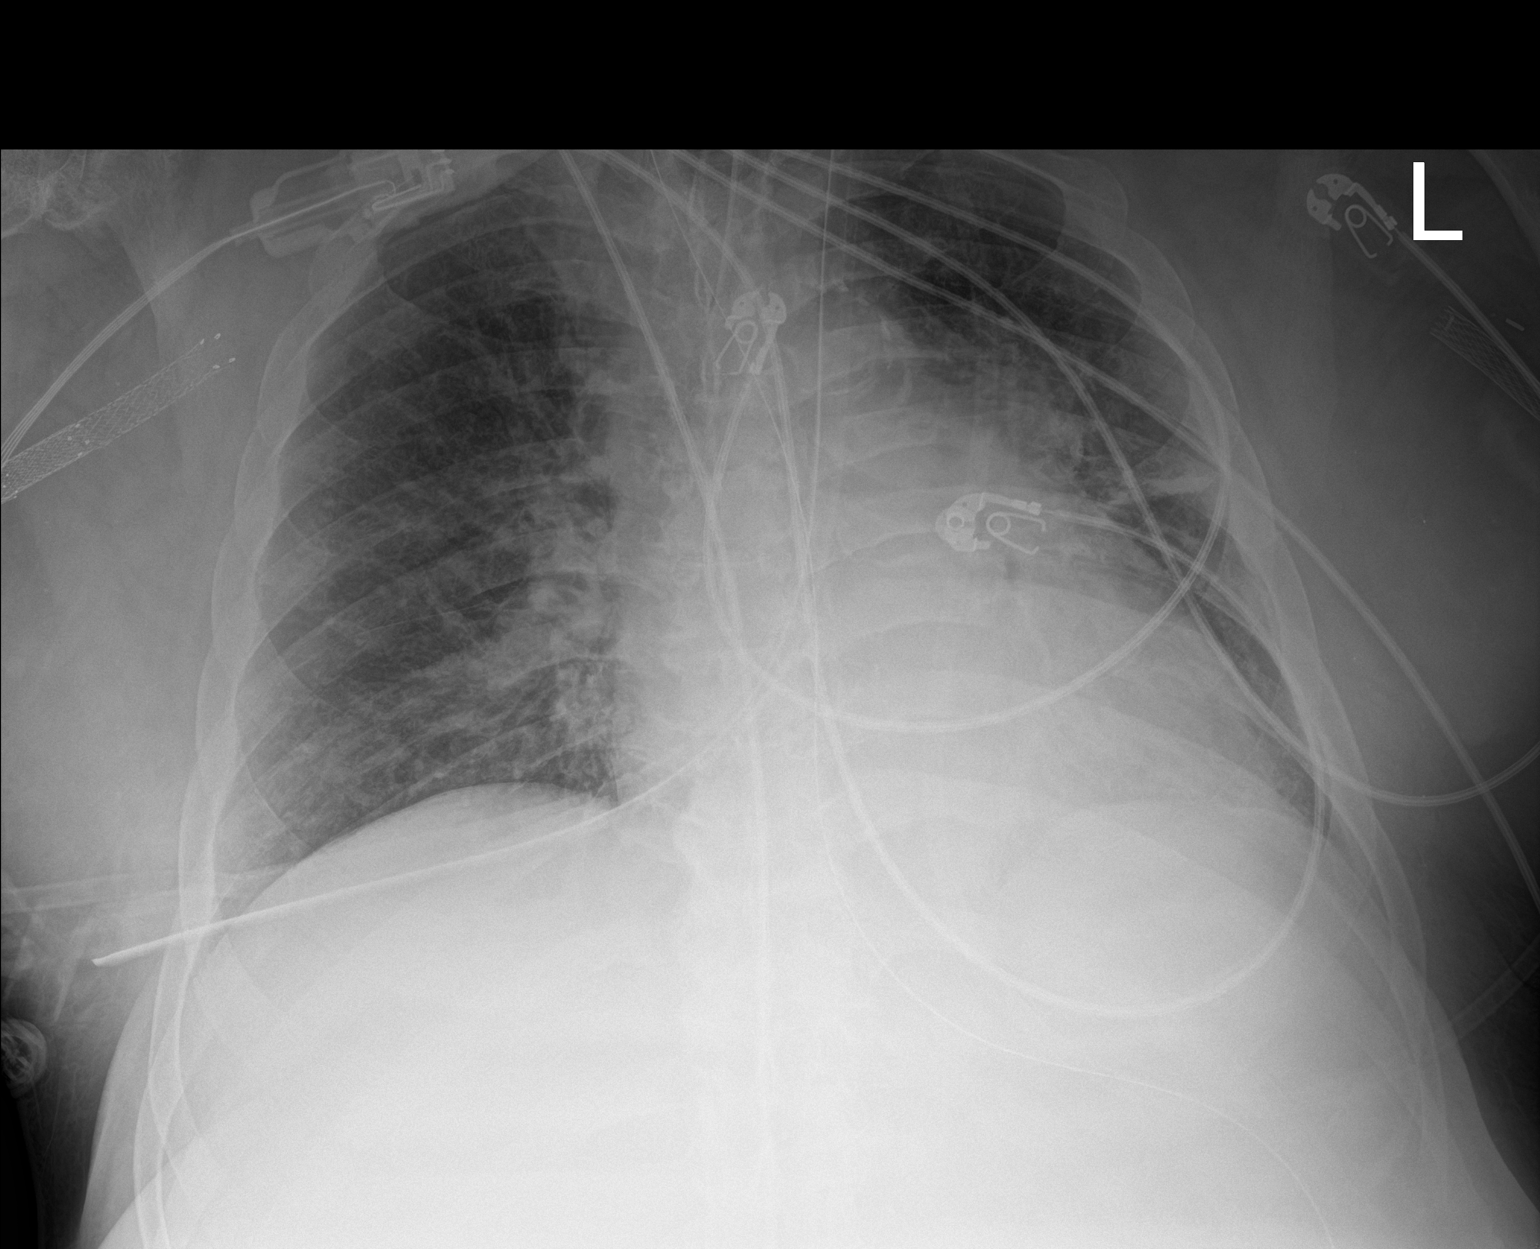

[1 of 1 positions shown; findings below may reference images not displayed]

FINDINGS: Interim intubation. Endotracheal tube tip 3.5 cm above the carina.
Interim placement of NG tube, its tip is below the left
hemidiaphragm. Stable cardiomegaly. Diffuse bilateral interstitial
prominence again noted suggesting interstitial edema. Pneumonitis
cannot be excluded. Left mid lung field subsegmental atelectasis. No
pleural effusion noted on this exam. No pneumothorax. Right
subclavian stents noted.
IMPRESSION: 1. Interim intubation. Endotracheal tube tip 5 cm above the carina.
Interim placement of NG tube, its tip is below left hemidiaphragm.

2. Stable cardiomegaly. Diffuse bilateral interstitial prominence
again noted suggesting interstitial edema. Pneumonitis cannot be
excluded. No pleural effusion noted on this exam.

3.  Left mid lung field subsegmental atelectasis.

## 2020-08-27 IMAGING — DX DG ABD PORTABLE 1V
2 series · 2 of 2 positions shown · non-contrast
Comparison: None.

CLINICAL DATA: Orogastric tube placement

EXAM:
PORTABLE ABDOMEN - 1 VIEW

[abdomen kub (1 of 2)]
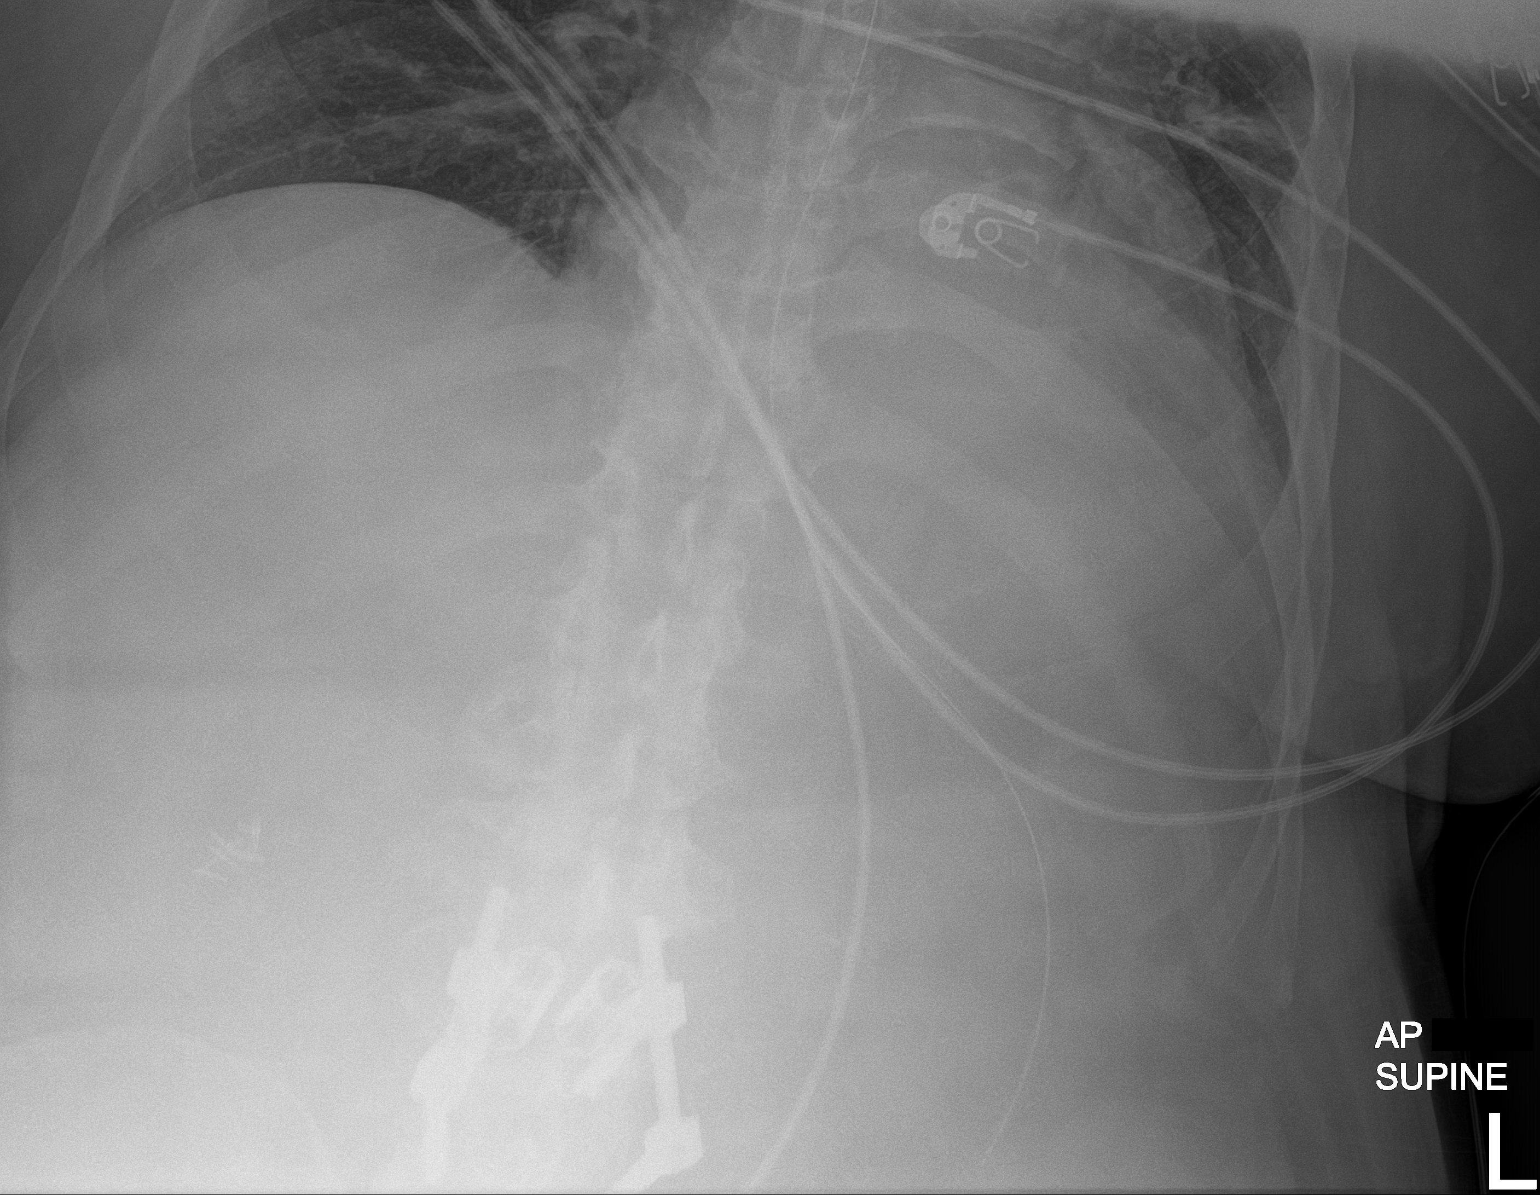

[abdomen kub (2 of 2)]
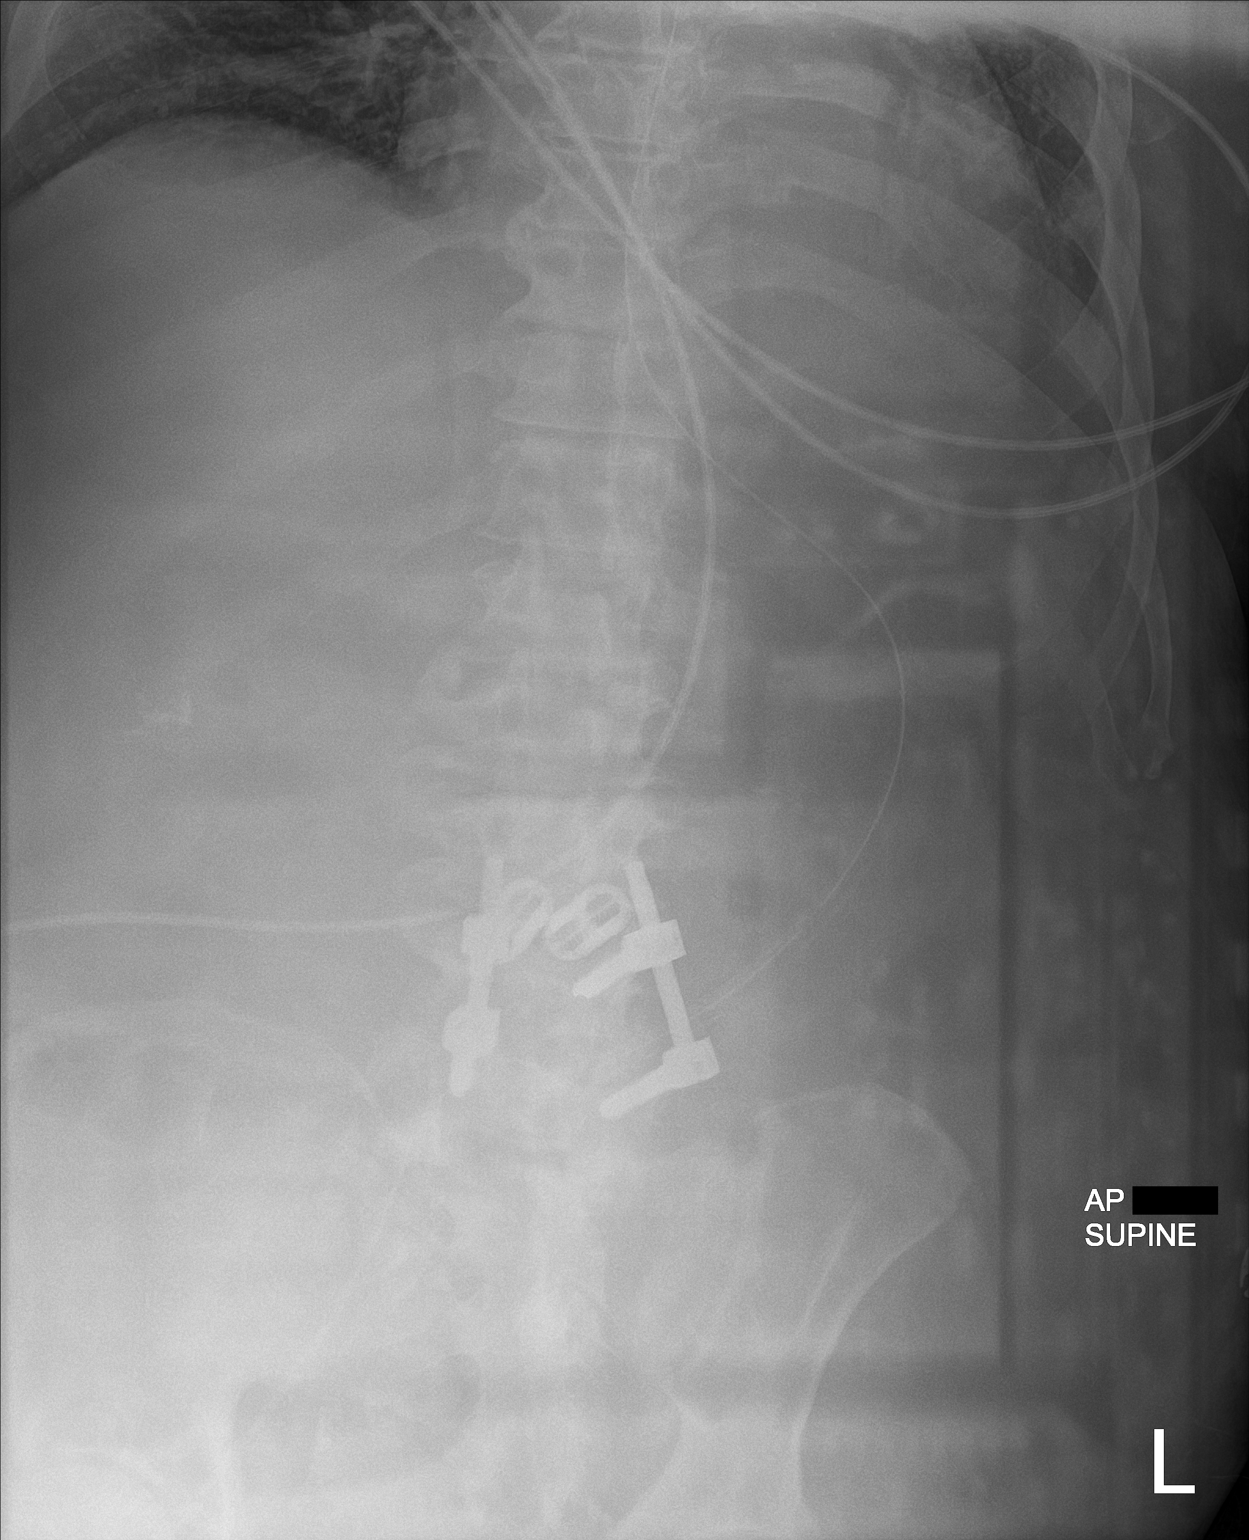

[2 of 2 positions shown; findings below may reference images not displayed]

FINDINGS: AP portable supine view of the upper abdomen was obtained for the
purposes of orogastric tube localization. Orogastric tube is seen
coursing below the diaphragm with distal tip and side port
projecting over the expected location of the distal stomach.
Nonobstructive bowel gas pattern.
IMPRESSION: Orogastric tube tip and side port project over the expected location
of the distal stomach.

## 2020-08-27 IMAGING — DX DG CHEST 1V PORT
1 series · 1 of 1 positions shown · non-contrast
Comparison: November 06, 2019

CLINICAL DATA: Worsening blood sugars.

EXAM:
PORTABLE CHEST 1 VIEW

[chest ap]
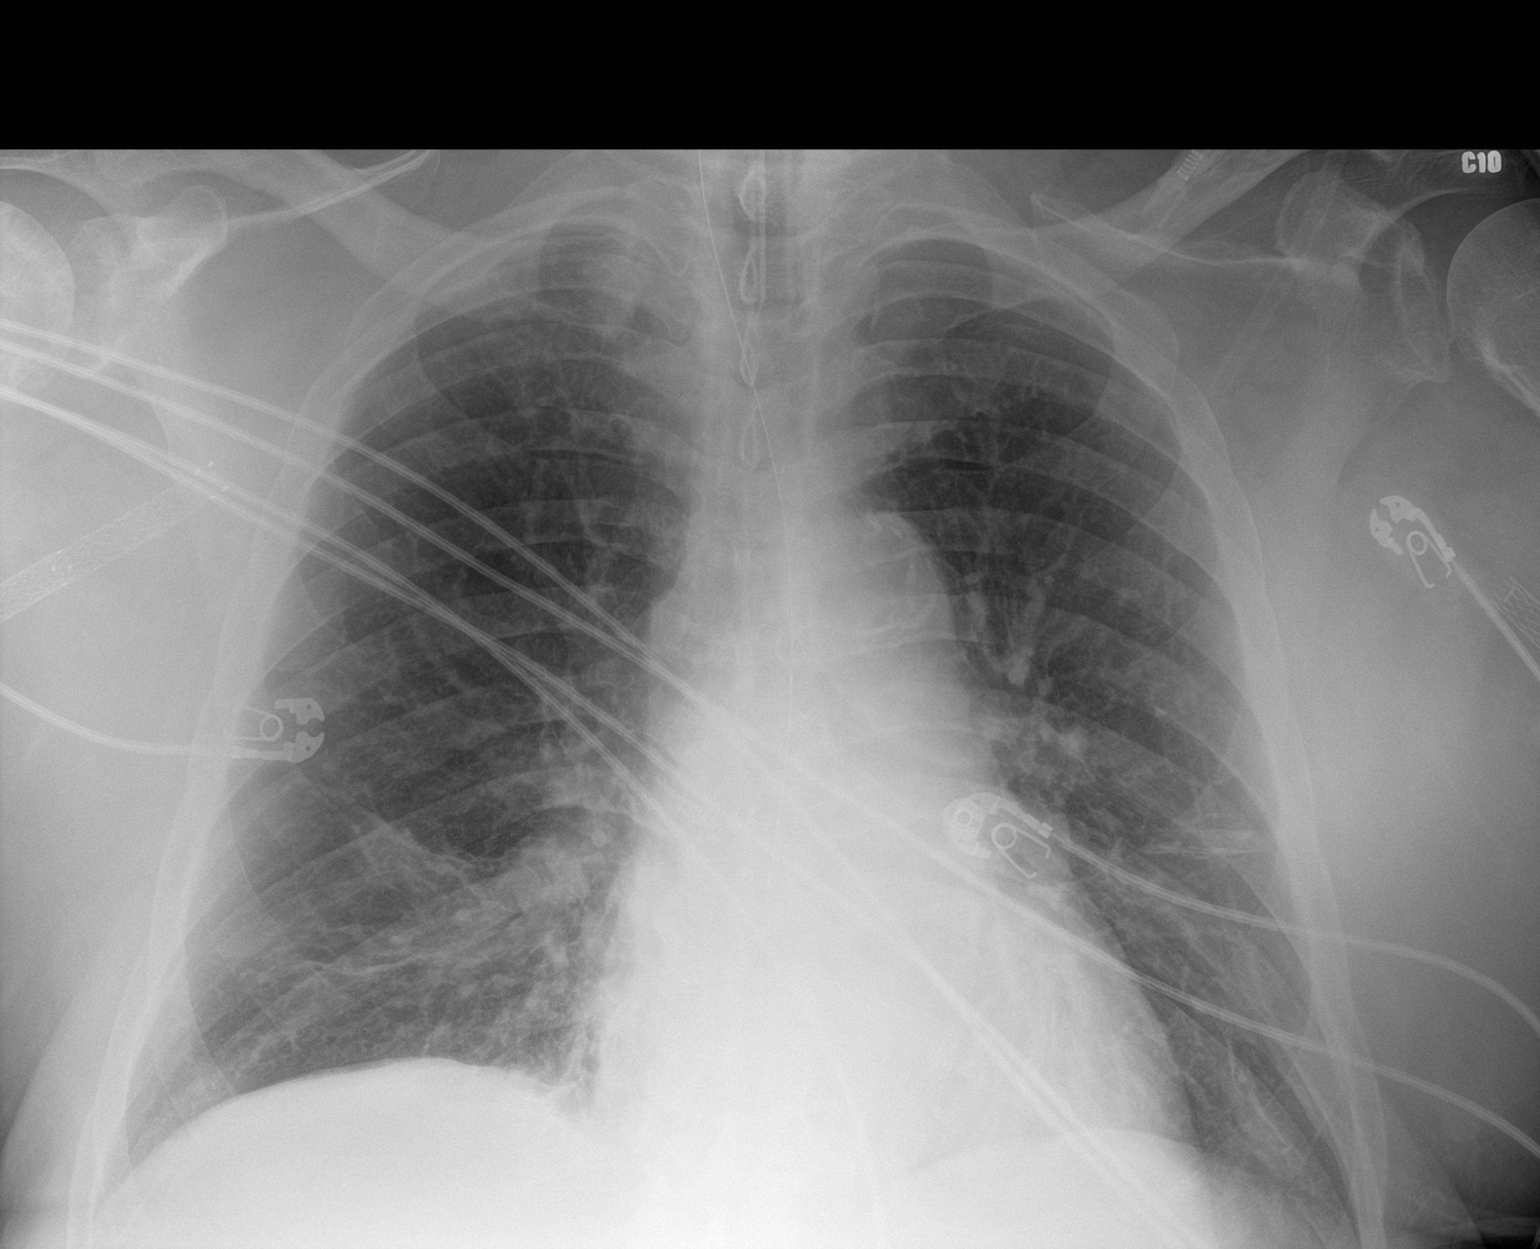

[1 of 1 positions shown; findings below may reference images not displayed]

FINDINGS: There is stable endotracheal tube and nasogastric tube positioning.
Very mild linear atelectasis is seen within the bilateral lung
bases. There is no evidence of a pleural effusion or pneumothorax.
The heart size and mediastinal contours are within normal limits. A
radiopaque fusion plate and screws are seen overlying the lower
cervical spine. The visualized skeletal structures are unremarkable.
IMPRESSION: Very mild bibasilar linear atelectasis.
